# Patient Record
Sex: Female | Born: 1960
Health system: Southern US, Community
[De-identification: ages and names within clinical notes are randomized; demographics above are authoritative.]

## PROBLEM LIST (undated history)

## (undated) DIAGNOSIS — I10 Essential (primary) hypertension: Secondary | ICD-10-CM

## (undated) DIAGNOSIS — Z87442 Personal history of urinary calculi: Secondary | ICD-10-CM

## (undated) DIAGNOSIS — G35D Multiple sclerosis, unspecified: Secondary | ICD-10-CM

## (undated) DIAGNOSIS — Z720 Tobacco use: Secondary | ICD-10-CM

## (undated) DIAGNOSIS — E7800 Pure hypercholesterolemia, unspecified: Secondary | ICD-10-CM

## (undated) DIAGNOSIS — M62838 Other muscle spasm: Secondary | ICD-10-CM

## (undated) DIAGNOSIS — G8929 Other chronic pain: Secondary | ICD-10-CM

## (undated) DIAGNOSIS — Z9889 Other specified postprocedural states: Secondary | ICD-10-CM

## (undated) DIAGNOSIS — M25512 Pain in left shoulder: Secondary | ICD-10-CM

## (undated) DIAGNOSIS — G609 Hereditary and idiopathic neuropathy, unspecified: Secondary | ICD-10-CM

## (undated) DIAGNOSIS — M549 Dorsalgia, unspecified: Secondary | ICD-10-CM

## (undated) DIAGNOSIS — N319 Neuromuscular dysfunction of bladder, unspecified: Secondary | ICD-10-CM

## (undated) DIAGNOSIS — T8859XA Other complications of anesthesia, initial encounter: Secondary | ICD-10-CM

## (undated) DIAGNOSIS — D573 Sickle-cell trait: Secondary | ICD-10-CM

## (undated) DIAGNOSIS — G35 Multiple sclerosis: Secondary | ICD-10-CM

## (undated) DIAGNOSIS — R339 Retention of urine, unspecified: Secondary | ICD-10-CM

## (undated) DIAGNOSIS — Z90711 Acquired absence of uterus with remaining cervical stump: Secondary | ICD-10-CM

## (undated) DIAGNOSIS — N200 Calculus of kidney: Secondary | ICD-10-CM

## (undated) DIAGNOSIS — R8290 Unspecified abnormal findings in urine: Secondary | ICD-10-CM

## (undated) DIAGNOSIS — R112 Nausea with vomiting, unspecified: Secondary | ICD-10-CM

## (undated) DIAGNOSIS — N643 Galactorrhea not associated with childbirth: Secondary | ICD-10-CM

## (undated) DIAGNOSIS — R7303 Prediabetes: Secondary | ICD-10-CM

## (undated) DIAGNOSIS — IMO0002 Reserved for concepts with insufficient information to code with codable children: Secondary | ICD-10-CM

## (undated) DIAGNOSIS — R1011 Right upper quadrant pain: Secondary | ICD-10-CM

## (undated) DIAGNOSIS — R3129 Other microscopic hematuria: Secondary | ICD-10-CM

## (undated) DIAGNOSIS — M8580 Other specified disorders of bone density and structure, unspecified site: Secondary | ICD-10-CM

## (undated) DIAGNOSIS — S62109A Fracture of unspecified carpal bone, unspecified wrist, initial encounter for closed fracture: Secondary | ICD-10-CM

## (undated) DIAGNOSIS — E039 Hypothyroidism, unspecified: Secondary | ICD-10-CM

## (undated) HISTORY — DX: Prediabetes: R73.03

## (undated) HISTORY — DX: Other muscle spasm: M62.838

## (undated) HISTORY — DX: Tobacco use: Z72.0

## (undated) HISTORY — DX: Pure hypercholesterolemia, unspecified: E78.00

## (undated) HISTORY — DX: Hereditary and idiopathic neuropathy, unspecified: G60.9

## (undated) HISTORY — DX: Retention of urine, unspecified: R33.9

## (undated) HISTORY — DX: Neuromuscular dysfunction of bladder, unspecified: N31.9

## (undated) HISTORY — PX: TUBAL LIGATION: SHX77

## (undated) HISTORY — DX: Sickle-cell trait: D57.3

## (undated) HISTORY — DX: Other microscopic hematuria: R31.29

## (undated) HISTORY — DX: Multiple sclerosis: G35

## (undated) HISTORY — DX: Dorsalgia, unspecified: M54.9

## (undated) HISTORY — DX: Pain in left shoulder: M25.512

## (undated) HISTORY — DX: Right upper quadrant pain: R10.11

## (undated) HISTORY — DX: Calculus of kidney: N20.0

## (undated) HISTORY — DX: Other chronic pain: G89.29

## (undated) HISTORY — PX: KNEE SURGERY: SHX244

## (undated) HISTORY — DX: Unspecified abnormal findings in urine: R82.90

## (undated) HISTORY — DX: Hypothyroidism, unspecified: E03.9

## (undated) HISTORY — DX: Other specified disorders of bone density and structure, unspecified site: M85.80

## (undated) HISTORY — PX: FRACTURE SURGERY: SHX138

## (undated) HISTORY — DX: Multiple sclerosis, unspecified: G35.D

## (undated) HISTORY — DX: Galactorrhea not associated with childbirth: N64.3

## (undated) HISTORY — DX: Reserved for concepts with insufficient information to code with codable children: IMO0002

## (undated) HISTORY — DX: Acquired absence of uterus with remaining cervical stump: Z90.711

---

## 1994-05-08 HISTORY — PX: OTHER SURGICAL HISTORY: SHX169

## 2000-05-08 HISTORY — PX: BREAST CYST EXCISION: SHX579

## 2005-08-25 ENCOUNTER — Inpatient Hospital Stay: Payer: Self-pay | Admitting: Internal Medicine

## 2005-09-28 ENCOUNTER — Ambulatory Visit: Payer: Self-pay | Admitting: Internal Medicine

## 2005-10-09 ENCOUNTER — Ambulatory Visit: Payer: Self-pay | Admitting: Internal Medicine

## 2005-10-24 ENCOUNTER — Emergency Department: Payer: Self-pay | Admitting: Emergency Medicine

## 2006-02-26 ENCOUNTER — Ambulatory Visit: Payer: Self-pay | Admitting: Obstetrics and Gynecology

## 2006-03-08 HISTORY — PX: VAGINAL HYSTERECTOMY: SUR661

## 2006-10-25 ENCOUNTER — Ambulatory Visit: Payer: Self-pay | Admitting: Internal Medicine

## 2007-02-15 ENCOUNTER — Ambulatory Visit: Payer: Self-pay | Admitting: Urology

## 2007-10-28 ENCOUNTER — Ambulatory Visit: Payer: Self-pay | Admitting: Obstetrics and Gynecology

## 2009-03-11 ENCOUNTER — Ambulatory Visit: Payer: Self-pay | Admitting: Urology

## 2009-03-24 ENCOUNTER — Ambulatory Visit: Payer: Self-pay | Admitting: Internal Medicine

## 2009-03-30 ENCOUNTER — Ambulatory Visit: Payer: Self-pay | Admitting: Internal Medicine

## 2009-08-15 ENCOUNTER — Inpatient Hospital Stay: Payer: Self-pay | Admitting: Internal Medicine

## 2009-08-18 ENCOUNTER — Ambulatory Visit: Payer: Self-pay | Admitting: Cardiology

## 2009-09-06 ENCOUNTER — Encounter: Payer: Self-pay | Admitting: Neurology

## 2010-03-29 ENCOUNTER — Ambulatory Visit: Payer: Self-pay | Admitting: Internal Medicine

## 2010-07-25 ENCOUNTER — Ambulatory Visit: Payer: Self-pay | Admitting: Urology

## 2010-11-25 ENCOUNTER — Emergency Department: Payer: Self-pay | Admitting: Emergency Medicine

## 2011-05-12 ENCOUNTER — Ambulatory Visit: Payer: Self-pay | Admitting: Obstetrics and Gynecology

## 2011-07-31 ENCOUNTER — Inpatient Hospital Stay: Payer: Self-pay | Admitting: Internal Medicine

## 2011-07-31 LAB — COMPREHENSIVE METABOLIC PANEL
Alkaline Phosphatase: 103 U/L (ref 50–136)
BUN: 21 mg/dL — ABNORMAL HIGH (ref 7–18)
Bilirubin,Total: 0.6 mg/dL (ref 0.2–1.0)
Calcium, Total: 8.6 mg/dL (ref 8.5–10.1)
Co2: 24 mmol/L (ref 21–32)
Creatinine: 0.59 mg/dL — ABNORMAL LOW (ref 0.60–1.30)
Potassium: 3.9 mmol/L (ref 3.5–5.1)
SGOT(AST): 21 U/L (ref 15–37)
Sodium: 143 mmol/L (ref 136–145)
Total Protein: 8 g/dL (ref 6.4–8.2)

## 2011-07-31 LAB — CBC
HCT: 41.3 % (ref 35.0–47.0)
HGB: 13.8 g/dL (ref 12.0–16.0)
MCH: 27.8 pg (ref 26.0–34.0)
MCHC: 33.5 g/dL (ref 32.0–36.0)
MCV: 83 fL (ref 80–100)
RDW: 13.7 % (ref 11.5–14.5)
WBC: 11.3 10*3/uL — ABNORMAL HIGH (ref 3.6–11.0)

## 2011-07-31 LAB — URINALYSIS, COMPLETE
Bilirubin,UR: NEGATIVE
Glucose,UR: NEGATIVE mg/dL (ref 0–75)
Leukocyte Esterase: NEGATIVE
Ph: 5 (ref 4.5–8.0)
Protein: NEGATIVE
RBC,UR: NONE SEEN /HPF (ref 0–5)

## 2011-08-01 LAB — FOLATE: Folic Acid: 13 ng/mL (ref 3.1–100.0)

## 2011-08-01 LAB — HEMOGLOBIN A1C: Hemoglobin A1C: 5.1 % (ref 4.2–6.3)

## 2011-08-01 LAB — TSH: Thyroid Stimulating Horm: 0.204 u[IU]/mL — ABNORMAL LOW

## 2011-08-01 LAB — T4, FREE: Free Thyroxine: 1.22 ng/dL (ref 0.76–1.46)

## 2011-08-06 LAB — CULTURE, BLOOD (SINGLE)

## 2012-04-18 ENCOUNTER — Ambulatory Visit: Payer: Self-pay | Admitting: Unknown Physician Specialty

## 2012-04-22 LAB — PATHOLOGY REPORT

## 2012-05-13 ENCOUNTER — Ambulatory Visit: Payer: Self-pay | Admitting: Internal Medicine

## 2012-07-25 DIAGNOSIS — R3129 Other microscopic hematuria: Secondary | ICD-10-CM | POA: Insufficient documentation

## 2012-07-25 DIAGNOSIS — R1011 Right upper quadrant pain: Secondary | ICD-10-CM | POA: Insufficient documentation

## 2012-07-25 DIAGNOSIS — R339 Retention of urine, unspecified: Secondary | ICD-10-CM | POA: Insufficient documentation

## 2012-07-25 DIAGNOSIS — N3020 Other chronic cystitis without hematuria: Secondary | ICD-10-CM | POA: Insufficient documentation

## 2012-07-25 DIAGNOSIS — N319 Neuromuscular dysfunction of bladder, unspecified: Secondary | ICD-10-CM | POA: Insufficient documentation

## 2013-02-15 ENCOUNTER — Emergency Department: Payer: Self-pay | Admitting: Emergency Medicine

## 2013-02-15 LAB — CBC
HCT: 37.5 % (ref 35.0–47.0)
HGB: 12.9 g/dL (ref 12.0–16.0)
MCHC: 34.5 g/dL (ref 32.0–36.0)
MCV: 79 fL — ABNORMAL LOW (ref 80–100)
RBC: 4.72 10*6/uL (ref 3.80–5.20)
RDW: 13.6 % (ref 11.5–14.5)

## 2013-02-15 LAB — COMPREHENSIVE METABOLIC PANEL
Albumin: 3.6 g/dL (ref 3.4–5.0)
Alkaline Phosphatase: 127 U/L (ref 50–136)
Anion Gap: 5 — ABNORMAL LOW (ref 7–16)
BUN: 20 mg/dL — ABNORMAL HIGH (ref 7–18)
Bilirubin,Total: 0.3 mg/dL (ref 0.2–1.0)
Chloride: 109 mmol/L — ABNORMAL HIGH (ref 98–107)
Creatinine: 0.59 mg/dL — ABNORMAL LOW (ref 0.60–1.30)
Glucose: 94 mg/dL (ref 65–99)
SGOT(AST): 20 U/L (ref 15–37)
SGPT (ALT): 15 U/L (ref 12–78)
Total Protein: 7.2 g/dL (ref 6.4–8.2)

## 2013-02-15 LAB — TROPONIN I: Troponin-I: <0.02 ng/mL

## 2013-02-15 LAB — CK TOTAL AND CKMB (NOT AT ARMC): CK, Total: 49 U/L (ref 21–215)

## 2013-02-16 LAB — TROPONIN I: Troponin-I: <0.02 ng/mL

## 2013-02-21 ENCOUNTER — Ambulatory Visit: Payer: Self-pay | Admitting: Internal Medicine

## 2013-05-14 ENCOUNTER — Ambulatory Visit: Payer: Self-pay | Admitting: Internal Medicine

## 2013-08-05 HISTORY — PX: LAPAROSCOPIC SUPRACERVICAL HYSTERECTOMY: SHX5399

## 2013-08-05 LAB — HM PAP SMEAR: HM PAP: NEGATIVE

## 2013-08-19 DIAGNOSIS — E7800 Pure hypercholesterolemia, unspecified: Secondary | ICD-10-CM

## 2013-08-19 DIAGNOSIS — G609 Hereditary and idiopathic neuropathy, unspecified: Secondary | ICD-10-CM

## 2013-08-19 HISTORY — DX: Hereditary and idiopathic neuropathy, unspecified: G60.9

## 2013-08-19 HISTORY — DX: Pure hypercholesterolemia, unspecified: E78.00

## 2013-12-09 DIAGNOSIS — N200 Calculus of kidney: Secondary | ICD-10-CM

## 2013-12-09 DIAGNOSIS — N23 Unspecified renal colic: Secondary | ICD-10-CM | POA: Insufficient documentation

## 2013-12-09 HISTORY — DX: Calculus of kidney: N20.0

## 2014-05-21 DIAGNOSIS — M62838 Other muscle spasm: Secondary | ICD-10-CM | POA: Insufficient documentation

## 2014-05-21 DIAGNOSIS — R252 Cramp and spasm: Secondary | ICD-10-CM | POA: Insufficient documentation

## 2014-05-21 DIAGNOSIS — G479 Sleep disorder, unspecified: Secondary | ICD-10-CM | POA: Insufficient documentation

## 2014-05-21 DIAGNOSIS — M549 Dorsalgia, unspecified: Secondary | ICD-10-CM | POA: Insufficient documentation

## 2014-05-21 DIAGNOSIS — E669 Obesity, unspecified: Secondary | ICD-10-CM | POA: Insufficient documentation

## 2014-05-21 HISTORY — DX: Other muscle spasm: M62.838

## 2014-06-24 ENCOUNTER — Ambulatory Visit (INDEPENDENT_AMBULATORY_CARE_PROVIDER_SITE_OTHER): Payer: Medicare Other | Admitting: Podiatry

## 2014-06-24 ENCOUNTER — Encounter: Payer: Self-pay | Admitting: Podiatry

## 2014-06-24 DIAGNOSIS — Q828 Other specified congenital malformations of skin: Secondary | ICD-10-CM

## 2014-06-24 DIAGNOSIS — M79606 Pain in leg, unspecified: Secondary | ICD-10-CM

## 2014-06-24 DIAGNOSIS — B351 Tinea unguium: Secondary | ICD-10-CM

## 2014-06-24 NOTE — Progress Notes (Signed)
   Subjective:    Patient ID: Lisa Williams, female    DOB: Oct 02, 1960, 54 y.o.   MRN: [id number]  HPI  PT STATED RT FOOT HAVE CALLUS AND BEEN HURTING FOR OVER 1 YEAR. THE FOOT IS GETTING WORSE AND THE SKIN GETTING THICKER. TRIED CALLUS PADDING AND IT HELP SOME.  Review of Systems  Musculoskeletal: Positive for myalgias and gait problem.  All other systems reviewed and are negative.      Objective:   Physical Exam: I have reviewed her past medical history medications allergies surgery social history and review of systems. Pulses are strongly palpable bilateral. Neurologic sensorium is hyper reflexive secondary to MS. Muscle strength there is from one grew to the other and from limb to limp. Orthopedic evaluation demonstrates all joints distal to the ankle before range of motion without crepitation. Cutaneous evaluation demonstrates supple well-hydrated cutis nails are thick yellow dystrophic onychomycotic and painful palpation. Reactive hyperkeratosis of the first metatarsophalangeal joint of the right foot secondary to foot drop right.        Assessment & Plan:  Assessment: Multiple sclerosis resulting in foot drop and reactive hyperkeratosis plantar aspect of the right forefoot. This is a pre-ulcerative lesion. Pain in limb secondary to onychomycosis bilateral.  Plan: Debridement of nails 1 through 5 bilateral cover service secondary to pain and debridement of reactive hyperkeratosis today without incident or iatrogenic lesion. Discussed correction of the AFO device for which she wears to assist in offloading the forefoot.

## 2014-07-06 ENCOUNTER — Ambulatory Visit: Payer: Self-pay | Admitting: Internal Medicine

## 2014-07-06 LAB — HM MAMMOGRAPHY: HM MAMMO: NEGATIVE

## 2014-08-30 NOTE — H&P (Signed)
PATIENT NAME:  Lisa Williams, Lisa Williams MR#:  [id number] DATE OF BIRTH:  03/09/1961  DATE OF ADMISSION:  07/31/2011  PRIMARY CARE PHYSICIAN:  Dr. Kemper Durie ER PHYSICIAN:  Dr. Margarita Grizzle  CHIEF COMPLAINT: Sudden onset of right leg weakness.    HISTORY OF PRESENT ILLNESS: This is 54 year old female with a history of multiple sclerosis for the past  18 years who came in because the patient suddenly felt very weak in her legs. The patient was doing fine and went to [facility] and when coming back and getting out of the car she suddenly felt very weak in the legs, unable to get out and unable to move extremities, so EMS came and brought her here. Dr. Kemper Durie was notified by the ER doctor who suggested admitting her for MS flare.  The patient feels numb in both legs. She has a right leg brace for right foot drop feels very weak in the legs. She denies any chest pain. No cough. No fever. She said she worked hard over the weekend. No other specific symptoms. No blurred vision. She saw Dr. Kemper Durie on Friday and said everything was fine and no other complaints.   PAST MEDICAL HISTORY: Multiple sclerosis.  MEDICATIONS: Baclofen 40 mg 4 times daily.   PAST SURGICAL HISTORY:  1. Hysterectomy 2009.  2. Knee surgery twice.  ALLERGIES: Betadine.    SOCIAL HISTORY: Smokes about four cigars a day. No alcohol. No drugs. On disability.   FAMILY HISTORY: Significant for hypertension in father.  Father has congestive heart failure and diabetes. Mother also has hypertension. Sister has hemoglobin S.    REVIEW OF SYSTEMS:  CONSTITUTIONAL: Feels very weak. The patient has no fever.  EYES: No blurred vision. No loss of vision. ENT: No tinnitus. No epistaxis. No difficulty swallowing. RESPIRATORY: Denies any cough.  CARDIOVASCULAR: No chest pain. No palpitations. No trouble breathing. No pedal edema. GI: No nausea, no vomiting. No abdominal pain. GU: No dysuria. ENDOCRINE: No polyuria or nocturia. INTEGUMENT: No skin  rashes.  MUSCULOSKELETAL: No joint pains. NEUROLOGIC: Weakness and numbness in both legs. No dysarthria. PSYCH: No anxiety or insomnia.   PHYSICAL EXAMINATION:  VITAL SIGNS: Temperature 100.3, pulse 93, respirations 17, blood pressure is 113/59, sats 99% on room air.  GENERAL:  The patient is alert, awake, and oriented.    HEENT: Head atraumatic, normocephalic. Conjunctivae and lids are normal.  Pupils are equally reacting to light. Extraocular movements are intact. No nystagmus. ENT: Tympanic membranes not congested.  Nasal mucosa has no erythema.  Throat no oropharyngeal erythema.    NECK: No JVD. No carotid bruit. No lymphadenopathy.   LUNGS: Bilateral breath sounds present. No wheeze. No rales. Slight tachycardia.  Clear to auscultation.   CARDIOVASCULAR: S1 and S2 regular. Slight tachycardia present.   ABDOMEN: Soft, nontender, nondistended. Bowel sounds present.   EXTREMITIES: No extremity edema. No cyanosis. No clubbing.   NEUROLOGIC: The patient is weak bilaterally in both legs, unable to lift legs against gravity. Power is 1/5 in both legs.  Power is good in both the upper extremities. Sensations are also diminished in both legs, more so on the left side. The right leg has brace for the right footdrop, which is her baseline.  Cranial nerves are normal II-XII.  Deep tendon reflexes are 1+ in both extremities.   PSYCH: The patient was oriented to time, place, and person.   LABORATORY DATA: WBC 11.3, hemoglobin is 13.8, hematocrit 41.3, platelets 183.  Electrolytes: Sodium 143, potassium 3.9, chloride 108,  bicarbonate 24, BUN 21, creatinine 0.9, and glucose 81. Chest x-ray shows no focal infiltrate.    ASSESSMENT AND PLAN:  1. This is a 54 year old female with multiple sclerosis flare with sudden weakness and numbness in the legs. Dr. Kemper Durie was notified by the ER doctor and suggested admitting the patient for IV Solu-Medrol 1 gram IV for five days. We will admit her and continue  Solu-Medrol, check neuro checks, and ask Dr. Kemper Durie to see the patient. 2. GI prophylaxis and deep vein thrombosis prophylaxis. Also continue baclofen. 3. Leukocytosis with fever. The patient has no focal symptoms. We will monitor CBC and fever curve.  We will not start any antibiotics at this time. The patient has no symptoms of cough or sore throat or any abdominal pain. Chest x-ray is also clear. We will just monitor the CBC.  4. The patient's condition is stable.   TIME SPENT ON HISTORY AND PHYSICAL: About 60 minutes.   ____________________________ Katha Hamming, MD sk:bjt D: 07/31/2011 18:59:33 ET T: 08/01/2011 08:19:15 ET JOB#: [id number]  cc: Katha Hamming, MD, <Dictator> Rose Phi. Kemper Durie, MD Katha Hamming MD ELECTRONICALLY SIGNED 08/03/2011 8:18

## 2014-08-30 NOTE — Consult Note (Signed)
PATIENT NAME:  Lisa Williams, Lisa Williams MR#:  [id number] DATE OF BIRTH:  February 06, 1961  DATE OF CONSULTATION:  08/01/2011  REFERRING PHYSICIAN:  Dr. Vianne Bulls  CONSULTING PHYSICIAN:  Mila Homer. Tamala Julian, MD  REASON FOR CONSULTATION: Multiple sclerosis flare.   HISTORY OF PRESENT ILLNESS: This is a 54 year old right hand dominant African American female with known history of multiple sclerosis who comes in with acute onset of right-sided weakness and left-sided weakness mainly in the legs that occurred one day prior. She has been known to have multiple sclerosis since 1995 where she was worked up at that time with MRI, lumbar puncture, and evoked potentials which all showed this. She tried immunosuppression for a few years and then stopped because she didn't like the shots and since then has been off anything. Her last multiple sclerosis flare was in 2011 when she noted some weakness. Before that it was 2005. This resolved with no suppression whatsoever. Otherwise, she has been doing well and ambulating and doing most of her own activities of daily living.   PAST MEDICAL HISTORY: Past medical history is only significant for multiple sclerosis.  PAST SURGICAL HISTORY:  1. Hysterectomy. 2. Knee surgery. 3. Breast cyst removal.  MEDICATIONS AT HOME: Baclofen.   ALLERGIES: Betadine only.  FAMILY HISTORY: No autoimmune disorders or stroke.   SOCIAL HISTORY: She has positive tobacco. No alcohol. No illicits. She lives by herself.  REVIEW OF SYSTEMS: Negative x14 systems except as noted per history of present illness.      PHYSICAL EXAMINATION:  VITAL SIGNS: Temperature 98.0, blood pressure 114/68, pulse 74, respirations 18, sating 97% on room air.  GENERAL: She is slightly overweight in no acute distress.  HEENT: She is normocephalic. Sclerae nonicteric. Oropharynx clear.  NECK: Supple. No JVD.   CHEST: Clear to auscultation bilaterally without wheezes.  CARDIOVASCULAR: Regular rate and  rhythm. No murmurs.  ABDOMEN: Soft, nontender, and nondistended.   EXTREMITIES: No cyanosis, clubbing, or edema.  SKIN: No obvious lesions.   NEUROLOGIC: She is alert and oriented x3. Normal language. Normal speech.   CRANIAL NERVES: Pupils are equal and reactive bilateral. Extraocular movements intact. She has normal visual fields. Her face is symmetric. Tongue is midline. Shoulder shrug is equal.   MOTOR: She is 5/5 bilateral upper extremities. She is 2/5 in lower extremities to hip flexion and knee extension; 4/5 distally. There is also poor effort in trying to lift her legs up. She has slightly increased tone right greater than the left. She has a tremor noted in the left hand as well.   CEREBELLAR: Finger-to-nose and heel-to-shin within normal limits.  REFLEXES: 2+ bilateral uppers, 3+ right patellar reflex, 2+ left patellar, Achilles 1+ symmetrically. She has a right Babinski. Left plantar downgoing.   SENSORY: She has decreased vibratory sensory in lower extremities. She has no sensory below the hip. She also has loss of proprioception below the hip. Pinprick and temperature are normal.   There is no imaging to review.   CBC and BMP were reviewed and within normal limits.   IMPRESSION:  1. Multiple sclerosis flare. This does not follow all the exact patterns of a multiple sclerosis flare even though this is the highest probability that it could be. It is very unusual for the patient to lose complete vibratory sense and proprioception below their waist with this multiple sclerosis and this makes concern for other things such as B12 deficiency or even other type of neuropathy causing this. Syphilis of the spinal cord can do  this as well, yet we highly doubt this with tabes dorsalis. She has never had any imaging of her spine to look for any kind of spinal cord lesion.  2. Neuropathy. Etiology of this is unknown. This does not seem to be multiple sclerosis in origin so will evaluate  further for this.   PLAN: 1. MRI of the brain with and without contrast. 2. MRI of the C-spine with and without contrast. 3. Will check an SPEP, hemoglobin A1c, thyroid, RPR, B12, folate, ESR, CRP. 4. The patient should continue Solu-Medrol 1 gram IV daily x5 days.  We will continue to follow this patient with you. Thank you for the consultation.   ____________________________ Mila Homer Tamala Julian, MD mcs:drc D: 08/01/2011 12:11:37 ET T: 08/01/2011 13:01:13 ET JOB#: [id number]  cc: Mila Homer Tamala Julian, MD, <Dictator> Jamison Neighbor MD ELECTRONICALLY SIGNED 08/02/2011 10:59

## 2014-08-30 NOTE — Discharge Summary (Signed)
PATIENT NAME:  Lisa Williams, Lisa Williams MR#:  [id number] DATE OF BIRTH:  [birth year]  DATE OF ADMISSION:  07/31/2011 DATE OF DISCHARGE:  08/04/2011  DIAGNOSES: 1. Possible multiple sclerosis flare. 2. History of smoking.   DISPOSITION: The patient is being discharged home.   FOLLOW-UP: Follow-up with Dr. Loletta Specter in 1 to 2 weeks after discharge.   DISCHARGE MEDICATIONS:  1. Multivitamin 1 tablet daily.  2. Baclofen 40 mg q.i.d.  3. Dr. Loletta Specter has prescribed a prednisone taper for the patient which she will pick up from her pharmacy.   CONSULTATION: Neurology consultation with Dr. Tamala Julian    LABORATORY, DIAGNOSTIC, AND RADIOLOGICAL DATA: The patient was unable to have an MRI of the brain because of claustrophobia. Chest x-ray no acute disease of the chest. HIV negative. Complement C3, C4 level normal. ANA negative. ACE normal. HTLV I and II are pending. TSH was low but T3, T4 are normal. B12 is normal. RPR is negative. CRP was high. Vitamin D levels were normal. CBC normal. ESR normal. Hemoglobin A1c normal. B12 normal.   HOSPITAL COURSE: The patient is a 54 year old female with past medical history of multiple sclerosis who presented with sudden onset of bilateral lower extremity numbness. She was admitted with a diagnosis of possible multiple sclerosis flare and treated with high dose steroids for five days. Neurology consultation with Dr. Tamala Julian was obtained who felt that the patient's symptoms were not typical of multiple sclerosis because she had loss of vibration and proprioception in her lower extremity. He was concerned about possible transverse myelitis/involvement of spinal cord by the multiple sclerosis. MRI of the spine and brain were ordered, however, the patient was claustrophobic and was unable to undergo the procedure twice in spite of being given sedation. Her other extensive work-up including B12, folate, hemoglobin A1c, RPR, ESR, HIV, ANA, ACE inhibitors, C3, C4 levels were normal. HTLV  I and II are pending. She had low TSH but her free T3 and T4 are normal. She was evaluated by physical therapy during the hospitalization and no skill needs were identified. She requested a reacher and a front-wheeled walker, a prescription for which was given to the patient. By the time of discharge the patient felt significantly improved. Dr. Loletta Specter has called a prednisone taper prescription to her pharmacy which she will pick up upon discharge from the hospital. She will also follow-up with Dr. Loletta Specter as an outpatient.   TIME SPENT: 45 minutes.   ____________________________ Cherre Huger, MD sp:drc D: 08/04/2011 15:39:00 ET T: 08/07/2011 08:51:18 ET JOB#: [id number]  cc: Cherre Huger, MD, <Dictator> Cherre Huger MD ELECTRONICALLY SIGNED 08/12/2011 12:21

## 2014-09-28 ENCOUNTER — Ambulatory Visit: Payer: Medicare Other

## 2014-11-12 ENCOUNTER — Ambulatory Visit: Payer: Self-pay | Admitting: Obstetrics and Gynecology

## 2014-11-26 ENCOUNTER — Encounter: Payer: Self-pay | Admitting: Obstetrics and Gynecology

## 2014-11-26 ENCOUNTER — Ambulatory Visit (INDEPENDENT_AMBULATORY_CARE_PROVIDER_SITE_OTHER): Payer: Medicare Other | Admitting: Obstetrics and Gynecology

## 2014-11-26 VITALS — BP 163/84 | HR 76 | Ht 71.0 in | Wt 225.0 lb

## 2014-11-26 DIAGNOSIS — R638 Other symptoms and signs concerning food and fluid intake: Secondary | ICD-10-CM

## 2014-11-26 DIAGNOSIS — Z90711 Acquired absence of uterus with remaining cervical stump: Secondary | ICD-10-CM

## 2014-11-26 DIAGNOSIS — Z0000 Encounter for general adult medical examination without abnormal findings: Secondary | ICD-10-CM | POA: Diagnosis not present

## 2014-11-26 DIAGNOSIS — IMO0002 Reserved for concepts with insufficient information to code with codable children: Secondary | ICD-10-CM | POA: Insufficient documentation

## 2014-11-26 DIAGNOSIS — R888 Abnormal findings in other body fluids and substances: Secondary | ICD-10-CM | POA: Diagnosis not present

## 2014-11-26 DIAGNOSIS — Z01419 Encounter for gynecological examination (general) (routine) without abnormal findings: Secondary | ICD-10-CM

## 2014-11-26 DIAGNOSIS — Z720 Tobacco use: Secondary | ICD-10-CM

## 2014-11-26 DIAGNOSIS — G35 Multiple sclerosis: Secondary | ICD-10-CM

## 2014-11-26 DIAGNOSIS — N643 Galactorrhea not associated with childbirth: Secondary | ICD-10-CM | POA: Insufficient documentation

## 2014-11-26 DIAGNOSIS — Z90710 Acquired absence of both cervix and uterus: Secondary | ICD-10-CM | POA: Diagnosis not present

## 2014-11-26 DIAGNOSIS — B977 Papillomavirus as the cause of diseases classified elsewhere: Secondary | ICD-10-CM | POA: Diagnosis not present

## 2014-11-26 HISTORY — DX: Acquired absence of uterus with remaining cervical stump: Z90.711

## 2014-11-26 HISTORY — DX: Tobacco use: Z72.0

## 2014-11-26 HISTORY — DX: Galactorrhea not associated with childbirth: N64.3

## 2014-11-26 NOTE — Progress Notes (Signed)
Patient ID: Lisa Williams, female   DOB: 10/03/1960, 54 y.o.   MRN: [id number] ANNUAL PREVENTATIVE CARE GYN  ENCOUNTER NOTE  Subjective:       Lisa Williams is a 54 y.o. G0P0000 female here for a routine annual gynecologic exam.  Current complaints: 1.  ANNUAL 2.  History of positive HPV on Pap   Gynecologic History No LMP recorded. Patient has had a hysterectomy. Contraception: NA Last Pap: 08/05/2013 NEGATIVE/+HPV;  Colposcopy (05/14/2014) endocervix-fragments of negative endocervix,; Extemely scanty specimen. Last mammogram:  07/06/2014 [facility]: negative.  Obstetric History OB History  Gravida Para Term Preterm AB SAB TAB Ectopic Multiple Living  0 0 0 0 0 0 0 0 0 0         Past Medical History  Diagnosis Date  . MS (multiple sclerosis)   . HPV test positive     Past Surgical History  Procedure Laterality Date  . Laparoscopic supracervical hysterectomy  08/05/2013  . Bilateral tubal ligation  1996  . Knee surgery      multiple     Current Outpatient Prescriptions on File Prior to Visit  Medication Sig Dispense Refill  . baclofen (LIORESAL) 20 MG tablet Take by mouth.    . Multiple Vitamins-Minerals (MULTIVITAMIN WITH MINERALS) tablet Take by mouth.    . oxybutynin (DITROPAN-XL) 10 MG 24 hr tablet Take by mouth.    Marland Kitchen tiZANidine (ZANAFLEX) 4 MG tablet Take by mouth.     No current facility-administered medications on file prior to visit.    Allergies  Allergen Reactions  . Povidone Iodine Rash    History   Social History  . Marital Status: Single    Spouse Name: N/A  . Number of Children: N/A  . Years of Education: N/A   Occupational History  . Not on file.   Social History Main Topics  . Smoking status: Current Every Day Smoker  . Smokeless tobacco: Never Used  . Alcohol Use: No  . Drug Use: No  . Sexual Activity: No   Other Topics Concern  . Not on file   Social History Narrative    Family History  Problem Relation Age of  Onset  . Sickle cell trait Sister   . Supraventricular tachycardia Sister   . Diabetes Maternal Grandmother   . Diabetes Maternal Aunt   . Breast cancer Paternal Grandmother   . Liver cancer Maternal Grandmother   . Hypertension Father   . Hypertension Sister   . Ovarian cancer Paternal Grandmother     The following portions of the patient's history were reviewed and updated as appropriate: allergies, current medications, past family history, past medical history, past social history, past surgical history and problem list.  Review of Systems ROS Review of Systems - General ROS: negative for - chills, fatigue, fever, hot flashes, night sweats, weight gain or weight loss Psychological ROS: negative for - anxiety, decreased libido, depression, mood swings, physical abuse or sexual abuse Ophthalmic ROS: negative for - blurry vision, eye pain or loss of vision ENT ROS: negative for - headaches, hearing change, visual changes or vocal changes Allergy and Immunology ROS: negative for - hives, itchy/watery eyes or seasonal allergies Hematological and Lymphatic ROS: negative for - bleeding problems, bruising, swollen lymph nodes or weight loss Endocrine ROS: negative for - galactorrhea, hair pattern changes, hot flashes, malaise/lethargy, mood swings, palpitations, polydipsia/polyuria, skin changes, temperature intolerance or unexpected weight changes Breast ROS: negative for - new or changing breast lumps or nipple discharge Respiratory  ROS: negative for - cough or shortness of breath Cardiovascular ROS: negative for - chest pain, irregular heartbeat, palpitations or shortness of breath Gastrointestinal ROS: no abdominal pain, change in bowel habits, or black or bloody stools Genito-Urinary ROS: POSITIVE-trouble voiding, hematuria Musculoskeletal ROS: negative for - joint pain; POSITIVE- joint stiffness Neurological ROS: negative for - bowel and bladder control changes Dermatological ROS:  negative for rash and skin lesion changes   Objective:   BP 163/84 mmHg  Pulse 76  Ht  (1.803 m)  Wt 225 lb (102.059 kg)  BMI 31.39 kg/m2 CONSTITUTIONAL: Well-developed, well-nourished female in no acute distress.  PSYCHIATRIC: Normal mood and affect. Normal behavior. Normal judgment and thought content. NEUROLGIC: Alert and oriented to person, place, and time. Normal muscle tone coordination. No cranial nerve deficit noted. HENT:  Normocephalic, atraumatic, External right and left ear normal. Oropharynx is clear and moist EYES: Conjunctivae and EOM are normal. Pupils are equal, round, and reactive to light. No scleral icterus.  NECK: Normal range of motion, supple, no masses.  Normal thyroid.  SKIN: Skin is warm and dry. No rash noted. Not diaphoretic. No erythema. No pallor. CARDIOVASCULAR: Normal heart rate noted, regular rhythm, no murmur. RESPIRATORY: Clear to auscultation bilaterally. Effort and breath sounds normal, no problems with respiration noted. BREASTS: Symmetric in size. No masses, skin changes, nipple drainage, or lymphadenopathy. ABDOMEN: Soft, normal bowel sounds, no distention noted.  No tenderness, rebound or guarding.  BLADDER: Normal PELVIC:  External Genitalia: Normal  BUS: Normal  Vagina: Normal  Cervix: Normal  Uterus: surgically absent  Adnexa: Normal  RV: External Exam NormaI, No Rectal Masses and Normal Sphincter tone  MUSCULOSKELETAL: Normal range of motion. No tenderness.  No cyanosis, clubbing, or edema.  2+ distal pulses.SPASTICITY. LYMPHATIC: No Axillary, Supraclavicular, or Inguinal Adenopathy.    Assessment:   Annual gynecologic examination 54 y.o. Contraception: na (hysterectomy)-LSH BMI: 31 Multiple sclerosis, stable HPV positive on Pap smear    Plan:  Pap: DONE, PAP/HPV Mammogram: NOT ORDERED: DONE 07/06/2014 Stool Guaiac Testing:  Already done per pt. Labs: Lipid, HbgA1c, TSH Routine preventative health maintenance measures  emphasized:  Encouraged healthy eating, portion control and exercise as tolerated. Return to Clinic - 1 Year   Fenton Malling, LPN   Herold Harms, MD

## 2014-11-27 ENCOUNTER — Other Ambulatory Visit: Payer: Medicare Other

## 2014-11-28 LAB — TSH: TSH: 2.32 u[IU]/mL (ref 0.450–4.500)

## 2014-11-28 LAB — LIPID PANEL
CHOL/HDL RATIO: 3.5 ratio (ref 0.0–4.4)
CHOLESTEROL TOTAL: 178 mg/dL (ref 100–199)
HDL: 51 mg/dL (ref >39)
LDL Calculated: 112 mg/dL — ABNORMAL HIGH (ref 0–99)
Triglycerides: 74 mg/dL (ref 0–149)
VLDL Cholesterol Cal: 15 mg/dL (ref 5–40)

## 2014-11-28 LAB — HEMOGLOBIN A1C
ESTIMATED AVERAGE GLUCOSE: 103 mg/dL
HEMOGLOBIN A1C: 5.2 % (ref 4.8–5.6)

## 2014-11-28 LAB — GLUCOSE, FASTING: Glucose, Plasma: 81 mg/dL (ref 65–99)

## 2014-11-30 LAB — PAP IG AND HPV HIGH-RISK: PAP SMEAR COMMENT: 0

## 2015-05-18 ENCOUNTER — Ambulatory Visit: Payer: Medicare Other | Admitting: Obstetrics and Gynecology

## 2015-06-02 ENCOUNTER — Ambulatory Visit (INDEPENDENT_AMBULATORY_CARE_PROVIDER_SITE_OTHER): Payer: Medicare Other | Admitting: Obstetrics and Gynecology

## 2015-06-02 ENCOUNTER — Encounter: Payer: Self-pay | Admitting: Obstetrics and Gynecology

## 2015-06-02 VITALS — BP 172/81 | HR 75 | Wt 223.5 lb

## 2015-06-02 DIAGNOSIS — L739 Follicular disorder, unspecified: Secondary | ICD-10-CM | POA: Diagnosis not present

## 2015-06-02 DIAGNOSIS — B977 Papillomavirus as the cause of diseases classified elsewhere: Secondary | ICD-10-CM

## 2015-06-02 DIAGNOSIS — IMO0002 Reserved for concepts with insufficient information to code with codable children: Secondary | ICD-10-CM

## 2015-06-02 DIAGNOSIS — R888 Abnormal findings in other body fluids and substances: Secondary | ICD-10-CM

## 2015-06-02 NOTE — Patient Instructions (Signed)
1.  Pap smear/HPV performed today. 2.  No treatment is necessary for folliculitis.  At this time. 3.  Return in 6 months for annual exam

## 2015-06-02 NOTE — Addendum Note (Signed)
Addended by: Jackquline Denmark on: 06/02/2015 10:43 AM   Modules accepted: Orders

## 2015-06-02 NOTE — Progress Notes (Signed)
Chief complaint: 1.  Positive high-risk HPV on Pap. 2.  Inguinal boil.  Lisa Williams presents today for her 6 month Pap smear.  This Pap smear was negative for cellular abnormalities, but positive for high-risk HPV. Lisa Williams has noted some hair follicle inflammation with boil development and subsequent rupture.  She is not having any fevers.  Past medical history, past surgical history, medications, allergies, reviewed.  OBJECTIVE: BP 172/81 mmHg  Pulse 75  Wt 223 lb 8 oz (101.379 kg) Pleasant female with MS, needing assistance getting on and off the exam table as well as assistance with maintaining leg separation to perform Pap smear. Pelvic: Healing folliculitis in right groin. External genitalia-normal BUS-normal Vagina-normal Cervix-nulliparous; no significant discharge; no lesions. Bimanual-not done.  ASSESSMENT: 1.  Positive high risk HPV on Pap smear (cells, negative). 2.  Inguinal folliculitis, resolving, without need for antibiotics.  PLAN: 1.  Pap smear/HPV. 2.  No treatment necessary for resolving folliculitis. 3.  Return in 6 months for annual exam.  Herold Harms, MD  Note: This dictation was prepared with Dragon dictation along with smaller phrase technology. Any transcriptional errors that result from this process are unintentional.

## 2015-06-09 LAB — PAP IG AND HPV HIGH-RISK
HPV, HIGH-RISK: NEGATIVE
PAP Smear Comment: 0

## 2015-07-14 ENCOUNTER — Emergency Department: Payer: Medicare Other

## 2015-07-14 ENCOUNTER — Inpatient Hospital Stay
Admission: EM | Admit: 2015-07-14 | Discharge: 2015-07-17 | DRG: 060 | Disposition: A | Discharge status: Home / Self Care | Payer: Medicare Other | Attending: Internal Medicine | Admitting: Internal Medicine

## 2015-07-14 ENCOUNTER — Encounter: Payer: Self-pay | Admitting: Emergency Medicine

## 2015-07-14 DIAGNOSIS — A630 Anogenital (venereal) warts: Secondary | ICD-10-CM | POA: Diagnosis present

## 2015-07-14 DIAGNOSIS — Z888 Allergy status to other drugs, medicaments and biological substances status: Secondary | ICD-10-CM

## 2015-07-14 DIAGNOSIS — M549 Dorsalgia, unspecified: Secondary | ICD-10-CM | POA: Diagnosis present

## 2015-07-14 DIAGNOSIS — G35 Multiple sclerosis: Secondary | ICD-10-CM | POA: Diagnosis not present

## 2015-07-14 DIAGNOSIS — G8929 Other chronic pain: Secondary | ICD-10-CM | POA: Diagnosis present

## 2015-07-14 DIAGNOSIS — Z79899 Other long term (current) drug therapy: Secondary | ICD-10-CM

## 2015-07-14 DIAGNOSIS — M62838 Other muscle spasm: Secondary | ICD-10-CM | POA: Diagnosis present

## 2015-07-14 DIAGNOSIS — F17210 Nicotine dependence, cigarettes, uncomplicated: Secondary | ICD-10-CM | POA: Diagnosis present

## 2015-07-14 DIAGNOSIS — N319 Neuromuscular dysfunction of bladder, unspecified: Secondary | ICD-10-CM | POA: Diagnosis present

## 2015-07-14 DIAGNOSIS — R040 Epistaxis: Secondary | ICD-10-CM | POA: Diagnosis not present

## 2015-07-14 HISTORY — DX: Dorsalgia, unspecified: M54.9

## 2015-07-14 LAB — URINALYSIS COMPLETE WITH MICROSCOPIC (ARMC ONLY)
BACTERIA UA: NONE SEEN
Bilirubin Urine: NEGATIVE
GLUCOSE, UA: NEGATIVE mg/dL
LEUKOCYTES UA: NEGATIVE
Nitrite: NEGATIVE
PH: 6 (ref 5.0–8.0)
Protein, ur: NEGATIVE mg/dL
Specific Gravity, Urine: 1.013 (ref 1.005–1.030)

## 2015-07-14 LAB — CBC WITH DIFFERENTIAL/PLATELET
BASOS PCT: 1 %
Basophils Absolute: 0.1 10*3/uL (ref 0–0.1)
EOS ABS: 0 10*3/uL (ref 0–0.7)
Eosinophils Relative: 1 %
HCT: 37.6 % (ref 35.0–47.0)
HEMOGLOBIN: 13.1 g/dL (ref 12.0–16.0)
Lymphocytes Relative: 8 %
Lymphs Abs: 0.6 10*3/uL — ABNORMAL LOW (ref 1.0–3.6)
MCH: 27.1 pg (ref 26.0–34.0)
MCHC: 35 g/dL (ref 32.0–36.0)
MCV: 77.5 fL — ABNORMAL LOW (ref 80.0–100.0)
MONO ABS: 1 10*3/uL — AB (ref 0.2–0.9)
MONOS PCT: 12 %
NEUTROS PCT: 78 %
Neutro Abs: 6.4 10*3/uL (ref 1.4–6.5)
Platelets: 177 10*3/uL (ref 150–440)
RBC: 4.85 MIL/uL (ref 3.80–5.20)
RDW: 13.5 % (ref 11.5–14.5)
WBC: 8.1 10*3/uL (ref 3.6–11.0)

## 2015-07-14 LAB — COMPREHENSIVE METABOLIC PANEL
ALK PHOS: 122 U/L (ref 38–126)
ALT: 18 U/L (ref 14–54)
AST: 20 U/L (ref 15–41)
Albumin: 4 g/dL (ref 3.5–5.0)
Anion gap: 9 (ref 5–15)
BILIRUBIN TOTAL: 0.6 mg/dL (ref 0.3–1.2)
BUN: 16 mg/dL (ref 6–20)
CALCIUM: 9.1 mg/dL (ref 8.9–10.3)
CO2: 23 mmol/L (ref 22–32)
CREATININE: 0.55 mg/dL (ref 0.44–1.00)
Chloride: 107 mmol/L (ref 101–111)
GFR calc non Af Amer: >60 mL/min (ref >60)
Glucose, Bld: 97 mg/dL (ref 65–99)
Potassium: 3.7 mmol/L (ref 3.5–5.1)
SODIUM: 139 mmol/L (ref 135–145)
TOTAL PROTEIN: 7.7 g/dL (ref 6.5–8.1)

## 2015-07-14 LAB — RAPID INFLUENZA A&B ANTIGENS (ARMC ONLY)
INFLUENZA A (ARMC): NEGATIVE
INFLUENZA B (ARMC): NEGATIVE

## 2015-07-14 LAB — TROPONIN I

## 2015-07-14 LAB — PREGNANCY, URINE: PREG TEST UR: NEGATIVE

## 2015-07-14 LAB — LIPASE, BLOOD: Lipase: 14 U/L (ref 11–51)

## 2015-07-14 MED ORDER — ONDANSETRON HCL 4 MG/2ML IJ SOLN: 4.0000 mg | Freq: Four times a day (QID) | INTRAMUSCULAR | Status: DC | PRN

## 2015-07-14 MED ORDER — SODIUM CHLORIDE 0.9 % IV SOLN
1000.0000 mg | Freq: Every day | INTRAVENOUS | Status: AC
  Administered 2015-07-14 – 2015-07-16 (×3): 1000 mg via INTRAVENOUS
  Filled 2015-07-14 (×3): qty 8

## 2015-07-14 MED ORDER — MORPHINE SULFATE (PF) 4 MG/ML IV SOLN
4.0000 mg | Freq: Once | INTRAVENOUS | Status: AC
  Administered 2015-07-14: 4 mg via INTRAVENOUS

## 2015-07-14 MED ORDER — ONDANSETRON HCL 4 MG/2ML IJ SOLN
4.0000 mg | Freq: Once | INTRAMUSCULAR | Status: AC
  Administered 2015-07-14: 4 mg via INTRAVENOUS

## 2015-07-14 MED ORDER — ONDANSETRON HCL 4 MG/2ML IJ SOLN
4.0000 mg | Freq: Once | INTRAMUSCULAR | Status: DC
  Filled 2015-07-14: qty 2

## 2015-07-14 MED ORDER — BACLOFEN 10 MG PO TABS
20.0000 mg | ORAL_TABLET | Freq: Four times a day (QID) | ORAL | Status: DC
  Administered 2015-07-14 – 2015-07-17 (×11): 20 mg via ORAL
  Filled 2015-07-14 (×12): qty 2

## 2015-07-14 MED ORDER — HYDROCODONE-ACETAMINOPHEN 5-325 MG PO TABS: 1.0000 | ORAL_TABLET | ORAL | Status: DC | PRN

## 2015-07-14 MED ORDER — MORPHINE SULFATE (PF) 2 MG/ML IV SOLN: 2.0000 mg | INTRAVENOUS | Status: DC | PRN

## 2015-07-14 MED ORDER — SODIUM CHLORIDE 0.9 % IV SOLN: 1000.0000 mg | Freq: Every day | INTRAVENOUS | Status: DC

## 2015-07-14 MED ORDER — SODIUM CHLORIDE 0.9 % IV SOLN
INTRAVENOUS | Status: DC
  Administered 2015-07-14 – 2015-07-15 (×2): via INTRAVENOUS

## 2015-07-14 MED ORDER — ONDANSETRON HCL 4 MG PO TABS: 4.0000 mg | ORAL_TABLET | Freq: Four times a day (QID) | ORAL | Status: DC | PRN

## 2015-07-14 MED ORDER — TIZANIDINE HCL 4 MG PO TABS
4.0000 mg | ORAL_TABLET | Freq: Four times a day (QID) | ORAL | Status: DC | PRN
  Filled 2015-07-14: qty 1

## 2015-07-14 MED ORDER — OXYBUTYNIN CHLORIDE 5 MG PO TABS
5.0000 mg | ORAL_TABLET | Freq: Three times a day (TID) | ORAL | Status: DC
  Administered 2015-07-14 – 2015-07-17 (×9): 5 mg via ORAL
  Filled 2015-07-14 (×9): qty 1

## 2015-07-14 MED ORDER — ACETAMINOPHEN 650 MG RE SUPP: 650.0000 mg | Freq: Four times a day (QID) | RECTAL | Status: DC | PRN

## 2015-07-14 MED ORDER — ACETAMINOPHEN 325 MG PO TABS: 650.0000 mg | ORAL_TABLET | Freq: Four times a day (QID) | ORAL | Status: DC | PRN

## 2015-07-14 MED ORDER — ADULT MULTIVITAMIN W/MINERALS CH
1.0000 | ORAL_TABLET | Freq: Every day | ORAL | Status: DC
Start: 2015-07-14 — End: 2015-07-17
  Administered 2015-07-14 – 2015-07-17 (×4): 1 via ORAL
  Filled 2015-07-14 (×4): qty 1

## 2015-07-14 MED ORDER — ENOXAPARIN SODIUM 40 MG/0.4ML ~~LOC~~ SOLN
40.0000 mg | SUBCUTANEOUS | Status: DC
  Administered 2015-07-14 – 2015-07-16 (×3): 40 mg via SUBCUTANEOUS
  Filled 2015-07-14 (×3): qty 0.4

## 2015-07-14 MED ORDER — KETOROLAC TROMETHAMINE 30 MG/ML IJ SOLN
30.0000 mg | Freq: Once | INTRAMUSCULAR | Status: AC
  Administered 2015-07-14: 30 mg via INTRAVENOUS
  Filled 2015-07-14: qty 1

## 2015-07-14 MED ORDER — SODIUM CHLORIDE 0.9 % IV BOLUS (SEPSIS)
1000.0000 mL | Freq: Once | INTRAVENOUS | Status: AC
  Administered 2015-07-14: 1000 mL via INTRAVENOUS

## 2015-07-14 NOTE — H&P (Signed)
[facility] [place] at [facility]   PATIENT NAME: Lisa Williams    MR#:  [id number]  DATE OF BIRTH:  [birth year]  DATE OF ADMISSION:  07/14/2015  PRIMARY CARE PHYSICIAN: Lauro Regulus., MD   REQUESTING/REFERRING PHYSICIAN: Dr. Toney Rakes  CHIEF COMPLAINT:   Chief Complaint  Patient presents with  . Weakness  . Nausea  . Eye Pain    HISTORY OF PRESENT ILLNESS:  Keilana Williams  is a 55 y.o. female with a known history of multiple sclerosis for 21 years, following with outpatient neurologist and not in any chronic therapy due to side effects presents to the hospital secondary to worsening right leg weakness and generalized muscle weakness. Patient states her MS flares usually involve the right leg and it presents as weakness. Denies any sensory changes. Also associated with that she has a significant headache and also nausea today. Denies any blurred vision, no chest pain abdominal pain or any other symptoms. She ambulates with a walker at baseline and also has a wheelchair but is independent and drives when she doesn't have the flares. Her symptoms started this morning. Seen by neurologist in the emergency room who recommended admission for IV steroids.  PAST MEDICAL HISTORY:   Past Medical History  Diagnosis Date  . MS (multiple sclerosis) (HCC)   . HPV test positive   . Back pain     PAST SURGICAL HISTORY:   Past Surgical History  Procedure Laterality Date  . Laparoscopic supracervical hysterectomy  08/05/2013  . Bilateral tubal ligation  1996  . Knee surgery      right    SOCIAL HISTORY:   Social History  Substance Use Topics  . Smoking status: Current Every Day Smoker -- 0.50 packs/day    Types: Cigarettes  . Smokeless tobacco: Never Used  . Alcohol Use: No    FAMILY HISTORY:   Family History  Problem Relation Age of Onset  . Sickle cell trait Sister   . Supraventricular tachycardia Sister   . Diabetes Maternal  Grandmother   . Diabetes Maternal Aunt   . Breast cancer Paternal Grandmother   . Liver cancer Maternal Grandmother   . Hypertension Father   . Hypertension Sister   . Ovarian cancer Paternal Grandmother     DRUG ALLERGIES:   Allergies  Allergen Reactions  . Povidone Iodine Rash    REVIEW OF SYSTEMS:   Review of Systems  Constitutional: Positive for malaise/fatigue. Negative for fever, chills and weight loss.  HENT: Negative for ear discharge, ear pain, hearing loss, nosebleeds and tinnitus.   Eyes: Negative for blurred vision, double vision and photophobia.  Respiratory: Negative for cough, hemoptysis, shortness of breath and wheezing.   Cardiovascular: Negative for chest pain, palpitations, orthopnea and leg swelling.  Gastrointestinal: Positive for nausea. Negative for heartburn, vomiting, abdominal pain, diarrhea, constipation and melena.  Genitourinary: Positive for urgency. Negative for dysuria and frequency.  Musculoskeletal: Positive for myalgias and back pain. Negative for neck pain.  Skin: Negative for rash.  Neurological: Positive for focal weakness and headaches. Negative for dizziness, tingling, tremors, sensory change and speech change.  Endo/Heme/Allergies: Does not bruise/bleed easily.  Psychiatric/Behavioral: Negative for depression.    MEDICATIONS AT HOME:   Prior to Admission medications   Medication Sig Start Date End Date Taking? Authorizing Provider  baclofen (LIORESAL) 20 MG tablet Take 20 mg by mouth 4 (four) times daily.    Yes Historical Provider, MD  Multiple Vitamins-Minerals (MULTIVITAMIN WITH MINERALS) tablet Take 1  tablet by mouth daily.    Yes Historical Provider, MD  oxybutynin (DITROPAN) 5 MG tablet Take 5 mg by mouth 3 (three) times daily.   Yes Historical Provider, MD  tiZANidine (ZANAFLEX) 4 MG tablet Take 4 mg by mouth every 6 (six) hours as needed for muscle spasms.    Yes Historical Provider, MD      VITAL SIGNS:  Blood pressure  150/69, pulse 93, temperature 98.9 F (37.2 C), temperature source Oral, resp. rate 18, height 5\' 11"  (1.803 m), weight 96.616 kg (213 lb), SpO2 99 %.  PHYSICAL EXAMINATION:   Physical Exam  GENERAL:  55 y.o.-year-old obese patient lying in the bed with no acute distress.  EYES: Pupils equal, round, reactive to light and accommodation. No scleral icterus. Extraocular muscles intact.  HEENT: Head atraumatic, normocephalic. Oropharynx and nasopharynx clear.  NECK:  Supple, no jugular venous distention. No thyroid enlargement, no tenderness.  LUNGS: Normal breath sounds bilaterally, no wheezing, rales,rhonchi or crepitation. No use of accessory muscles of respiration.  CARDIOVASCULAR: S1, S2 normal. No murmurs, rubs, or gallops.  ABDOMEN: Soft, nontender, nondistended. Bowel sounds present. No organomegaly or mass.  EXTREMITIES: No pedal edema, cyanosis, or clubbing.  NEUROLOGIC: Cranial nerves II through XII are intact. Muscle strength 5/5 in upper extremities and 3/5 in both lower extremities. Sensation intact. Gait not checked.  PSYCHIATRIC: The patient is alert and oriented x 3.  SKIN: No obvious rash, lesion, or ulcer.   LABORATORY PANEL:   CBC  Recent Labs Lab 07/14/15 1046  WBC 8.1  HGB 13.1  HCT 37.6  PLT 177   ------------------------------------------------------------------------------------------------------------------  Chemistries   Recent Labs Lab 07/14/15 1046  NA 139  K 3.7  CL 107  CO2 23  GLUCOSE 97  BUN 16  CREATININE 0.55  CALCIUM 9.1  AST 20  ALT 18  ALKPHOS 122  BILITOT 0.6   ------------------------------------------------------------------------------------------------------------------  Cardiac Enzymes  Recent Labs Lab 07/14/15 1046  TROPONINI <0.03   ------------------------------------------------------------------------------------------------------------------  RADIOLOGY:  Dg Chest 2 View  07/14/2015  CLINICAL DATA:  Weakness.   Head pressure. EXAM: CHEST  2 VIEW COMPARISON:  02/21/2013 FINDINGS: The heart size and mediastinal contours are within normal limits. Both lungs are clear. The visualized skeletal structures are unremarkable. IMPRESSION: No active cardiopulmonary disease. Electronically Signed   By: Signa Kell M.D.   On: 07/14/2015 12:53   Ct Head Wo Contrast  07/14/2015  CLINICAL DATA:  Right-sided weakness. Headache, complaining of pain behind the eyes. Vomiting and nausea x 1. EXAM: CT HEAD WITHOUT CONTRAST TECHNIQUE: Contiguous axial images were obtained from the base of the skull through the vertex without intravenous contrast. COMPARISON:  None. FINDINGS: There is no evidence of mass effect, midline shift or extra-axial fluid collections. There is no evidence of a space-occupying lesion or intracranial hemorrhage. There is no evidence of a cortical-based area of acute infarction. Mild periventricular white matter low attenuation. The ventricles and sulci are appropriate for the patient's age. The basal cisterns are patent. Visualized portions of the orbits are unremarkable. The visualized portions of the paranasal sinuses and mastoid air cells are unremarkable. The osseous structures are unremarkable. IMPRESSION: No acute intracranial pathology. Electronically Signed   By: Elige Ko   On: 07/14/2015 10:39    EKG:   Orders placed or performed during the hospital encounter of 07/14/15  . ED EKG  . ED EKG  . EKG 12-Lead  . EKG 12-Lead    IMPRESSION AND PLAN:   Adela Lank Klimaszewski  is a 55 y.o. female with a known history of multiple sclerosis for 21 years, following with outpatient neurologist and not in any chronic therapy due to side effects presents to the hospital secondary to worsening right leg weakness and generalized muscle weakness.  #1 MS exacerbation- appreciate neurology consult - admit, neuro checks, IV solumedrol - PT consult - cont her baclofen for muscle spasms  #2 Neurogenic bladder-  follows with Urology as outpatient. Cont home meds  #3 Chronic back pain- pain meds as needed  #4 DVT Prophylaxis- lovenox  #5 Tobacco use disorder- counseled against smoking for almost 3 min. Feels ready to quit.     All the records are reviewed and case discussed with ED provider. Management plans discussed with the patient, family and they are in agreement.  CODE STATUS: Full Code  TOTAL TIME TAKING CARE OF THIS PATIENT: 50 minutes.    Enid Baas M.D on 07/14/2015 at 2:56 PM  Between 7am to 6pm - Pager - [phone]  After 6pm go to [email or web address] - password [email or web address] [email or web address]  [place] [place]  Office  [phone]  CC: Primary care physician; Lauro Regulus., MD

## 2015-07-14 NOTE — ED Notes (Signed)
Patient at Xray.

## 2015-07-14 NOTE — Consult Note (Signed)
Reason for Consult:Lower extremity numbness and weakness Referring Physician: Inocencio Homes  CC: Lower extremity numbness and weakness  HPI: Lisa Williams is an 55 y.o. female with a history of MS who reports that last night she began to feel poorly.  She had a low grade temperature and body aches.  Last evening she also noted that she was having decreased ability to be able to hold her urine.  Today she developed headache, nausea and vomiting.  Has noted weakness in her lower extremities and is no longer able to ambulate.  Also reports numbness in her RLE and pelvic area.  Has had no true urinary incontinence or bowel incontinence.  Last bowel movement was yesterday.  Reports no upper extremity or visual complaints.     At baseline ambulates with a walker.  Past Medical History  Diagnosis Date  . MS (multiple sclerosis) (HCC)   . HPV test positive     Past Surgical History  Procedure Laterality Date  . Laparoscopic supracervical hysterectomy  08/05/2013  . Bilateral tubal ligation  1996  . Knee surgery      multiple     Family History  Problem Relation Age of Onset  . Sickle cell trait Sister   . Supraventricular tachycardia Sister   . Diabetes Maternal Grandmother   . Diabetes Maternal Aunt   . Breast cancer Paternal Grandmother   . Liver cancer Maternal Grandmother   . Hypertension Father   . Hypertension Sister   . Ovarian cancer Paternal Grandmother     Social History:  reports that she has been smoking Cigarettes.  She has been smoking about 0.50 packs per day. She has never used smokeless tobacco. She reports that she does not drink alcohol or use illicit drugs.  Allergies  Allergen Reactions  . Povidone Iodine Rash    Medications: I have reviewed the patient's current medications. Prior to Admission:  Prior to Admission medications   Medication Sig Start Date End Date Taking? Authorizing Provider  baclofen (LIORESAL) 20 MG tablet Take 20 mg by mouth 4 (four)  times daily.    Yes Historical Provider, MD  Multiple Vitamins-Minerals (MULTIVITAMIN WITH MINERALS) tablet Take 1 tablet by mouth daily.    Yes Historical Provider, MD  oxybutynin (DITROPAN) 5 MG tablet Take 5 mg by mouth 3 (three) times daily.   Yes Historical Provider, MD  tiZANidine (ZANAFLEX) 4 MG tablet Take 4 mg by mouth every 6 (six) hours as needed for muscle spasms.    Yes Historical Provider, MD    ROS: History obtained from the patient  General ROS: as noted in HPI Psychological ROS: negative for - behavioral disorder, hallucinations, memory difficulties, mood swings or suicidal ideation Ophthalmic ROS: negative for - blurry vision, double vision, eye pain or loss of vision ENT ROS: negative for - epistaxis, nasal discharge, oral lesions, sore throat, tinnitus or vertigo Allergy and Immunology ROS: negative for - hives or itchy/watery eyes Hematological and Lymphatic ROS: negative for - bleeding problems, bruising or swollen lymph nodes Endocrine ROS: negative for - galactorrhea, hair pattern changes, polydipsia/polyuria or temperature intolerance Respiratory ROS: cough Cardiovascular ROS: negative for - chest pain, dyspnea on exertion, edema or irregular heartbeat Gastrointestinal ROS: negative for - abdominal pain, diarrhea, hematemesis, nausea/vomiting or stool incontinence Genito-Urinary ROS:  as noted in HPI Musculoskeletal ROS: negative for - joint swelling Neurological ROS: as noted in HPI Dermatological ROS: negative for rash and skin lesion changes  Physical Examination: Blood pressure 150/69, pulse 93, temperature 98.9  F (37.2 C), temperature source Oral, resp. rate 17, height 5\' 11"  (1.803 m), weight 96.616 kg (213 lb), SpO2 99 %.  HEENT-  Normocephalic, no lesions, without obvious abnormality.  Normal external eye and conjunctiva.  Normal TM's bilaterally.  Normal auditory canals and external ears. Normal external nose, mucus membranes and septum.  Normal  pharynx. Cardiovascular- S1, S2 normal, pulses palpable throughout   Lungs- chest clear, no wheezing, rales, normal symmetric air entry Abdomen- soft, non-tender; bowel sounds normal; no masses,  no organomegaly Extremities- no edema Lymph-no adenopathy palpable Musculoskeletal-no joint tenderness, deformity or swelling Skin-warm and dry, no hyperpigmentation, vitiligo, or suspicious lesions  Neurological Examination Mental Status: Alert, oriented, thought content appropriate.  Speech fluent without evidence of aphasia.  Able to follow 3 step commands without difficulty. Cranial Nerves: II: Discs flat bilaterally; Visual fields grossly normal, pupils equal, round, reactive to light and accommodation III,IV, VI: ptosis not present, extra-ocular motions intact bilaterally V,VII: decrease in left NLF, facial light touch sensation normal bilaterally VIII: hearing normal bilaterally IX,X: gag reflex present XI: bilateral shoulder shrug XII: midline tongue extension Motor: Right : Upper extremity   5/5    Left:     Upper extremity   5/5 Unable to lift either lower extremity off the bed.  Able to wiggle toes on the left but only minimally so on the right Increased tone in the lower extremities Sensory: Pinprick and light touch decreased in the RLE Deep Tendon Reflexes: 3+ and symmetric.  3-4 beats of ankle clonus on the left.   Plantars: Right: upgoing   Left: upgoing Cerebellar: Normal finger-to-nose testing.  Unable to perform heel-to-shin testing due to weakness Gait: not tested due to safety concerns   Laboratory Studies:   Basic Metabolic Panel:  Recent Labs Lab 07/14/15 1046  NA 139  K 3.7  CL 107  CO2 23  GLUCOSE 97  BUN 16  CREATININE 0.55  CALCIUM 9.1    Liver Function Tests:  Recent Labs Lab 07/14/15 1046  AST 20  ALT 18  ALKPHOS 122  BILITOT 0.6  PROT 7.7  ALBUMIN 4.0    Recent Labs Lab 07/14/15 1048  LIPASE 14   No results for input(s): AMMONIA  in the last 168 hours.  CBC:  Recent Labs Lab 07/14/15 1046  WBC 8.1  NEUTROABS 6.4  HGB 13.1  HCT 37.6  MCV 77.5*  PLT 177    Cardiac Enzymes:  Recent Labs Lab 07/14/15 1046  TROPONINI <0.03    BNP: Invalid input(s): POCBNP  CBG: No results for input(s): GLUCAP in the last 168 hours.  Microbiology: Results for orders placed or performed during the hospital encounter of 07/14/15  Rapid Influenza A&B Antigens ([facility] only)     Status: None   Collection Time: 07/14/15 10:48 AM  Result Value Ref Range Status   Influenza A ([facility]) NEGATIVE NEGATIVE Final   Influenza B ([facility]) NEGATIVE NEGATIVE Final    Coagulation Studies: No results for input(s): LABPROT, INR in the last 72 hours.  Urinalysis:  Recent Labs Lab 07/14/15 1232  COLORURINE YELLOW*  LABSPEC 1.013  PHURINE 6.0  GLUCOSEU NEGATIVE  HGBUR 1+*  BILIRUBINUR NEGATIVE  KETONESUR 1+*  PROTEINUR NEGATIVE  NITRITE NEGATIVE  LEUKOCYTESUR NEGATIVE    Lipid Panel:     Component Value Date/Time   CHOL 178 11/27/2014 0956   TRIG 74 11/27/2014 0956   HDL 51 11/27/2014 0956   CHOLHDL 3.5 11/27/2014 0956   LDLCALC 112* 11/27/2014 1610  HgbA1C:  Lab Results  Component Value Date   HGBA1C 5.2 11/27/2014    Urine Drug Screen:  No results found for: LABOPIA, COCAINSCRNUR, LABBENZ, AMPHETMU, THCU, LABBARB  Alcohol Level: No results for input(s): ETH in the last 168 hours.  Other results: EKG: sinus rhythm at 85 bpm.  Imaging: Dg Chest 2 View  07/14/2015  CLINICAL DATA:  Weakness.  Head pressure. EXAM: CHEST  2 VIEW COMPARISON:  02/21/2013 FINDINGS: The heart size and mediastinal contours are within normal limits. Both lungs are clear. The visualized skeletal structures are unremarkable. IMPRESSION: No active cardiopulmonary disease. Electronically Signed   By: Signa Kell M.D.   On: 07/14/2015 12:53   Ct Head Wo Contrast  07/14/2015  CLINICAL DATA:  Right-sided weakness. Headache, complaining of  pain behind the eyes. Vomiting and nausea x 1. EXAM: CT HEAD WITHOUT CONTRAST TECHNIQUE: Contiguous axial images were obtained from the base of the skull through the vertex without intravenous contrast. COMPARISON:  None. FINDINGS: There is no evidence of mass effect, midline shift or extra-axial fluid collections. There is no evidence of a space-occupying lesion or intracranial hemorrhage. There is no evidence of a cortical-based area of acute infarction. Mild periventricular white matter low attenuation. The ventricles and sulci are appropriate for the patient's age. The basal cisterns are patent. Visualized portions of the orbits are unremarkable. The visualized portions of the paranasal sinuses and mastoid air cells are unremarkable. The osseous structures are unremarkable. IMPRESSION: No acute intracranial pathology. Electronically Signed   By: Elige Ko   On: 07/14/2015 10:39     Assessment/Plan: 55 year old female presenting with lower extremity weakness and numbness.  Patient with accompanying symptoms of low grade fever, nausea, vomiting and headache.  Patient reports that she has been stable with her MS for the past 5 years but that this is how her exacerbatios presente in thepast and they always presented with infection.  She is on no DMT per her choice.  Refuses MRI due to anxiety.  Work up shows normal wbc count and no evidence of PNA or UTI.  Currently afebrile.   Head CT personally reviewed and shows no acute changes.    Recommendations: 1.  Solumedrol 1000mg  IV X 3 days.  Will evaluate for the need of further steroids at the end of 3 days.   2.  GI prophylaxis 3.  Ambien prn for sleep 4.  PT evaluation 5.  DVT prophylaxis 6.  Will continue to follow with you  Thana Farr, MD Neurology [phone] 07/14/2015, 2:31 PM

## 2015-07-14 NOTE — ED Notes (Signed)
Patient presents to the ED via [facility] from home with right sided weakness, headache, complaining of pain behind her eyes, nausea and vomiting x 1.  Patient reports a fever and other cold symptoms for the past several days.  Patient has a history of MS and states that she often has "flare ups" when she has a cold.  Patient reports some epigastric pain.  Patient is nontender on palpation.  Per EMS patient is slightly weaker on the right side.  Patient states, "I just don't have much feeling in my legs."  Patient wears braces in both legs.  Patient cannot move her legs well at baseline.  Patient states she walks with a walker at home.

## 2015-07-14 NOTE — ED Provider Notes (Signed)
[facility] Emergency Department Provider Note  ____________________________________________  Time seen: Approximately 10:36 AM  I have reviewed the triage vital signs and the nursing notes.   HISTORY  Chief Complaint Weakness; Nausea; and Eye Pain    HPI Lisa Williams is a 55 y.o. female with history of multiple sclerosis presents for evaluation of nausea, right-sided weakness, one episode of nonbloody nonbilious emesis, epigastric pain radiating to her chest, subjective fevers and headache which began yesterday, gradual onset, constant since onset, currently moderate, no modifying factors. She reports that she feels the headache behind her eyes. She reports that often which is a viral illness she will have a flareup of her MS symptoms. She is complaining of feeling slightly weaker on the right side however has chronic weakness in both legs andambulates with a walker. No neck pain or neck stiffness.   Past Medical History  Diagnosis Date  . MS (multiple sclerosis) (HCC)   . HPV test positive   . Back pain     Patient Active Problem List   Diagnosis Date Noted  . Multiple sclerosis exacerbation (HCC) 07/14/2015  . MS (multiple sclerosis) (HCC) 11/26/2014  . Increased body mass index 11/26/2014  . HPV test positive 11/26/2014  . Status post laparoscopic supracervical hysterectomy 11/26/2014  . Galactorrhea 11/26/2014  . Tobacco user 11/26/2014  . Back ache 05/21/2014  . Adiposity 05/21/2014  . Disordered sleep 05/21/2014  . Muscle spasticity 05/21/2014  . Calculus of kidney 12/09/2013  . Renal colic 12/09/2013  . Hypercholesteremia 08/19/2013  . Hereditary and idiopathic neuropathy 08/19/2013  . Hypercholesterolemia without hypertriglyceridemia 08/19/2013  . Bladder infection, chronic 07/25/2012  . Disorder of bladder function 07/25/2012  . Incomplete bladder emptying 07/25/2012    Past Surgical History  Procedure Laterality Date  .  Laparoscopic supracervical hysterectomy  08/05/2013  . Bilateral tubal ligation  1996  . Knee surgery      right    Current Outpatient Rx  Name  Route  Sig  Dispense  Refill  . baclofen (LIORESAL) 20 MG tablet   Oral   Take 20 mg by mouth 4 (four) times daily.          . Multiple Vitamins-Minerals (MULTIVITAMIN WITH MINERALS) tablet   Oral   Take 1 tablet by mouth daily.          Marland Kitchen oxybutynin (DITROPAN) 5 MG tablet   Oral   Take 5 mg by mouth 3 (three) times daily.         Marland Kitchen tiZANidine (ZANAFLEX) 4 MG tablet   Oral   Take 4 mg by mouth every 6 (six) hours as needed for muscle spasms.            Allergies Povidone iodine  Family History  Problem Relation Age of Onset  . Sickle cell trait Sister   . Supraventricular tachycardia Sister   . Diabetes Maternal Grandmother   . Diabetes Maternal Aunt   . Breast cancer Paternal Grandmother   . Liver cancer Maternal Grandmother   . Hypertension Father   . Hypertension Sister   . Ovarian cancer Paternal Grandmother     Social History Social History  Substance Use Topics  . Smoking status: Current Every Day Smoker -- 0.50 packs/day    Types: Cigarettes  . Smokeless tobacco: Never Used  . Alcohol Use: No    Review of Systems Constitutional: No fever/chills Eyes: No visual changes. ENT: No sore throat. Cardiovascular: Denies chest pain. Respiratory: Denies shortness of breath.  Gastrointestinal: + abdominal pain.  + nausea, + vomiting.  No diarrhea.  No constipation. Genitourinary: Negative for dysuria. Musculoskeletal: Negative for back pain. Skin: Negative for rash. Neurological: Positive for headaches, positive for chronic focal weakness and numbness.  10-point ROS otherwise negative.  ____________________________________________   PHYSICAL EXAM:  VITAL SIGNS: ED Triage Vitals  Enc Vitals Group     BP 07/14/15 1005 179/88 mmHg     Pulse Rate 07/14/15 1005 97     Resp 07/14/15 1005 20     Temp  07/14/15 1005 98.9 F (37.2 C)     Temp Source 07/14/15 1005 Oral     SpO2 07/14/15 1001 98 %     Weight 07/14/15 1005 213 lb (96.616 kg)     Height 07/14/15 1005 5\' 11"  (1.803 m)     Head Cir --      Peak Flow --      Pain Score 07/14/15 1010 6     Pain Loc --      Pain Edu? --      Excl. in GC? --     Constitutional: Alert and oriented. Well appearing and in no acute distress. Eyes: Conjunctivae are normal. PERRL. EOMI. Head: Atraumatic. Nose: No congestion/rhinnorhea. Mouth/Throat: Mucous membranes are moist.  Oropharynx non-erythematous. Neck: No stridor.  Supple without meningismus. Cardiovascular: Normal rate, regular rhythm. Grossly normal heart sounds.  Good peripheral circulation. Respiratory: Normal respiratory effort.  No retractions. Lungs CTAB. Gastrointestinal: Soft and nontender. No distention.  No CVA tenderness. Genitourinary: deferred Musculoskeletal: No lower extremity tenderness nor edema.  No joint effusions. Neurologic:  Normal speech and language.  weakness in bilateral lower extremities, 2/5 strength. The lateral legs with braces. 4+ out of 5 strength in the right upper extremity, 5 out of 5 strength in the left upper extremity, sensation intact to light touch in bilateral upper extremities. Cranial nerves II through XII intact. Normal finger-nose-finger. Skin:  Skin is warm, dry and intact. No rash noted. Psychiatric: Mood and affect are normal. Speech and behavior are normal.  ____________________________________________   LABS (all labs ordered are listed, but only abnormal results are displayed)  Labs Reviewed  CBC WITH DIFFERENTIAL/PLATELET - Abnormal; Notable for the following:    MCV 77.5 (*)    Lymphs Abs 0.6 (*)    Monocytes Absolute 1.0 (*)    All other components within normal limits  URINALYSIS COMPLETEWITH MICROSCOPIC ([facility] ONLY) - Abnormal; Notable for the following:    Color, Urine YELLOW (*)    APPearance CLEAR (*)    Ketones, ur 1+  (*)    Hgb urine dipstick 1+ (*)    Squamous Epithelial / LPF 0-5 (*)    All other components within normal limits  RAPID INFLUENZA A&B ANTIGENS ([facility] ONLY)  COMPREHENSIVE METABOLIC PANEL  TROPONIN I  PREGNANCY, URINE  LIPASE, BLOOD   ____________________________________________  EKG  ED ECG REPORT I, Gayla Doss, the attending physician, personally viewed and interpreted this ECG.   Date: 07/14/2015  EKG Time: 10:09  Rate: 99  Rhythm: normal sinus rhythm  Axis: normal  Intervals:none  ST&T Change: No acute ST elevation.  ____________________________________________  RADIOLOGY  CT head  IMPRESSION: No acute intracranial pathology.   CXR IMPRESSION: No active cardiopulmonary disease. ____________________________________________   PROCEDURES  Procedure(s) performed: None  Critical Care performed: No  ____________________________________________   INITIAL IMPRESSION / ASSESSMENT AND PLAN / ED COURSE  Pertinent labs & imaging results that were available during my care of the patient  were reviewed by me and considered in my medical decision making (see chart for details).  Lisa Williams is a 55 y.o. female with history of multiple sclerosis presents for evaluation of nausea, one episode of nonbloody nonbilious emesis, epigastric pain radiating to her chest, subjective fevers and headache which began yesterday as well as weakness in the legs and right arm. On exam, she is generally nontoxic appearing and in no acute distress. Vital signs stable, she is afebrile. She is severely weak in both legs. She has question of the faintest weakness in terms of grip strength in the right arm but she has 5 out of 5 strength in the left and this has been going on since yesterday. Neck is supple without meningismus. Suspect  MS flare in the setting of a viral illness. Her abdomen exam is benign. We'll treat her symptoms, obtain screening labs, CT head, chest  x-ray.  ----------------------------------------- 1:50 PM on 07/14/2015 ----------------------------------------- Patient unable to stand and typically ambulates at home with a walker. Reports that she is having numbness in the right side and this is similar to her usual MS flares which she has not had in a very long time. I discussed the case with Dr. Thad Ranger of neurology who will evaluate the patient.  ----------------------------------------- 2:22 PM on 07/14/2015 ----------------------------------------- Thad Ranger evaluated the patient and recommends admission for exacerbation. Case discussed with hospice for admission at this time.  ____________________________________________   FINAL CLINICAL IMPRESSION(S) / ED DIAGNOSES  Final diagnoses:  Multiple sclerosis (HCC)      Gayla Doss, MD 07/14/15 1529

## 2015-07-15 NOTE — Progress Notes (Signed)
[facility] [place] at [facility]   PATIENT NAME: Lisa Williams    MR#:  [id number]  DATE OF BIRTH:  11/26/1960  SUBJECTIVE:  CHIEF COMPLAINT:   Chief Complaint  Patient presents with  . Weakness  . Nausea  . Eye Pain       came with worsening of multiple sclerosis and right-sided lower extremity weakness.      She had some upper respiratory viral symptoms for last few days which precipitated this episode.      Started on IV steroid and his recommendation from neurologist, today second day, and she feels significantly better.  REVIEW OF SYSTEMS:  CONSTITUTIONAL: No fever, fatigue or weakness.  EYES: No blurred or double vision.  EARS, NOSE, AND THROAT: No tinnitus or ear pain.  RESPIRATORY: No cough, shortness of breath, wheezing or hemoptysis.  CARDIOVASCULAR: No chest pain, orthopnea, edema.  GASTROINTESTINAL: No nausea, vomiting, diarrhea or abdominal pain.  GENITOURINARY: No dysuria, hematuria.  ENDOCRINE: No polyuria, nocturia,  HEMATOLOGY: No anemia, easy bruising or bleeding SKIN: No rash or lesion. MUSCULOSKELETAL: No joint pain or arthritis.   NEUROLOGIC: No tingling, numbness, right lower extremity weakness.  PSYCHIATRY: No anxiety or depression.   ROS  DRUG ALLERGIES:   Allergies  Allergen Reactions  . Povidone Iodine Rash    VITALS:  Blood pressure 149/69, pulse 61, temperature 98.2 F (36.8 C), temperature source Oral, resp. rate 19, height  (1.803 m), weight 96.616 kg (213 lb), SpO2 98 %.  PHYSICAL EXAMINATION:  GENERAL:  55 y.o.-year-old patient lying in the bed with no acute distress.  EYES: Pupils equal, round, reactive to light and accommodation. No scleral icterus. Extraocular muscles intact.  HEENT: Head atraumatic, normocephalic. Oropharynx and nasopharynx clear.  NECK:  Supple, no jugular venous distention. No thyroid enlargement, no tenderness.  LUNGS: Normal breath sounds bilaterally, no wheezing,  rales,rhonchi or crepitation. No use of accessory muscles of respiration.  CARDIOVASCULAR: S1, S2 normal. No murmurs, rubs, or gallops.  ABDOMEN: Soft, nontender, nondistended. Bowel sounds present. No organomegaly or mass.  EXTREMITIES: No pedal edema, cyanosis, or clubbing.  NEUROLOGIC: Cranial nerves II through XII are intact. Muscle strength 5/5 in both upper extremities, 1-2 /5 in right lower and 4/5 in left lower. Sensation intact. Gait not checked.  PSYCHIATRIC: The patient is alert and oriented x 3.  SKIN: No obvious rash, lesion, or ulcer.   Physical Exam LABORATORY PANEL:   CBC  Recent Labs Lab 07/14/15 1046  WBC 8.1  HGB 13.1  HCT 37.6  PLT 177   ------------------------------------------------------------------------------------------------------------------  Chemistries   Recent Labs Lab 07/14/15 1046  NA 139  K 3.7  CL 107  CO2 23  GLUCOSE 97  BUN 16  CREATININE 0.55  CALCIUM 9.1  AST 20  ALT 18  ALKPHOS 122  BILITOT 0.6   ------------------------------------------------------------------------------------------------------------------  Cardiac Enzymes  Recent Labs Lab 07/14/15 1046  TROPONINI <0.03   ------------------------------------------------------------------------------------------------------------------  RADIOLOGY:  Dg Chest 2 View  07/14/2015  CLINICAL DATA:  Weakness.  Head pressure. EXAM: CHEST  2 VIEW COMPARISON:  02/21/2013 FINDINGS: The heart size and mediastinal contours are within normal limits. Both lungs are clear. The visualized skeletal structures are unremarkable. IMPRESSION: No active cardiopulmonary disease. Electronically Signed   By: Signa Kell M.D.   On: 07/14/2015 12:53   Ct Head Wo Contrast  07/14/2015  CLINICAL DATA:  Right-sided weakness. Headache, complaining of pain behind the eyes. Vomiting and nausea x 1. EXAM: CT HEAD WITHOUT  CONTRAST TECHNIQUE: Contiguous axial images were obtained from the base of the  skull through the vertex without intravenous contrast. COMPARISON:  None. FINDINGS: There is no evidence of mass effect, midline shift or extra-axial fluid collections. There is no evidence of a space-occupying lesion or intracranial hemorrhage. There is no evidence of a cortical-based area of acute infarction. Mild periventricular white matter low attenuation. The ventricles and sulci are appropriate for the patient's age. The basal cisterns are patent. Visualized portions of the orbits are unremarkable. The visualized portions of the paranasal sinuses and mastoid air cells are unremarkable. The osseous structures are unremarkable. IMPRESSION: No acute intracranial pathology. Electronically Signed   By: Elige Ko   On: 07/14/2015 10:39    ASSESSMENT AND PLAN:   Active Problems:   Multiple sclerosis exacerbation (HCC)  #1 MS exacerbation- appreciate neurology consult  neuro checks, IV solumedrol- for 3 days - PT consult - cont her baclofen for muscle spasms  #2 Neurogenic bladder- follows with Urology as outpatient. Cont home meds  #3 Chronic back pain- pain meds as needed  #4 DVT Prophylaxis- lovenox  #5 Tobacco use disorder- counseled against smoking for almost 3 min. Feels ready to quit.    All the records are reviewed and case discussed with Care Management/Social Workerr. Management plans discussed with the patient, family and they are in agreement.  CODE STATUS: Full.  TOTAL TIME TAKING CARE OF THIS PATIENT: 35 minutes.    POSSIBLE D/C IN 1-2 DAYS, DEPENDING ON CLINICAL CONDITION.   Altamese Dilling M.D on 07/15/2015   Between 7am to 6pm - Pager - [phone]  After 6pm go to [email or web address] - password [email or web address]  [facility]  Office  [phone]  CC: Primary care physician; Lauro Regulus., MD  Note: This dictation was prepared with Dragon dictation along with smaller phrase technology. Any transcriptional errors that result from this  process are unintentional.

## 2015-07-15 NOTE — Progress Notes (Signed)
Subjective: Patient reports improvement today.  Strength and sensation improved.    Objective: Current vital signs: BP 131/70 mmHg  Pulse 56  Temp(Src) 97.9 F (36.6 C) (Oral)  Resp 18  Ht 5\' 11"  (1.803 m)  Wt 96.616 kg (213 lb)  BMI 29.72 kg/m2  SpO2 99% Vital signs in last 24 hours: Temp:  [97.9 F (36.6 C)-98.7 F (37.1 C)] 97.9 F (36.6 C) (03/09 0800) Pulse Rate:  [56-98] 56 (03/09 0800) Resp:  [15-19] 18 (03/09 0800) BP: (131-171)/(64-82) 131/70 mmHg (03/09 0800) SpO2:  [94 %-100 %] 99 % (03/09 0800)  Intake/Output from previous day: 03/08 0701 - 03/09 0700 In: 298 [P.O.:240; IV Piggyback:58] Out: 0  Intake/Output this shift: Total I/O In: 240 [P.O.:240] Out: 0  Nutritional status: Diet regular Room service appropriate?: Yes; Fluid consistency:: Thin  Neurologic Exam: Mental Status: Alert, oriented, thought content appropriate.  Speech fluent without evidence of aphasia.  Able to follow 3 step commands without difficulty. Cranial Nerves: II: Discs flat bilaterally; Visual fields grossly normal, pupils equal, round, reactive to light and accommodation III,IV, VI: ptosis not present, extra-ocular motions intact bilaterally V,VII: smile symmetric, facial light touch sensation normal bilaterally VIII: hearing normal bilaterally IX,X: gag reflex present XI: bilateral shoulder shrug XII: midline tongue extension Motor: Able to lift both legs a few inches off the bed and able to wiggle the toes bilaterally Sensation: Temperature sensation intact in both lower extremities   Lab Results: Basic Metabolic Panel:  Recent Labs Lab 07/14/15 1046  NA 139  K 3.7  CL 107  CO2 23  GLUCOSE 97  BUN 16  CREATININE 0.55  CALCIUM 9.1    Liver Function Tests:  Recent Labs Lab 07/14/15 1046  AST 20  ALT 18  ALKPHOS 122  BILITOT 0.6  PROT 7.7  ALBUMIN 4.0    Recent Labs Lab 07/14/15 1048  LIPASE 14   No results for input(s): AMMONIA in the last 168  hours.  CBC:  Recent Labs Lab 07/14/15 1046  WBC 8.1  NEUTROABS 6.4  HGB 13.1  HCT 37.6  MCV 77.5*  PLT 177    Cardiac Enzymes:  Recent Labs Lab 07/14/15 1046  TROPONINI <0.03    Lipid Panel: No results for input(s): CHOL, TRIG, HDL, CHOLHDL, VLDL, LDLCALC in the last 168 hours.  CBG: No results for input(s): GLUCAP in the last 168 hours.  Microbiology: Results for orders placed or performed during the hospital encounter of 07/14/15  Rapid Influenza A&B Antigens ([facility] only)     Status: None   Collection Time: 07/14/15 10:48 AM  Result Value Ref Range Status   Influenza A ([facility]) NEGATIVE NEGATIVE Final   Influenza B ([facility]) NEGATIVE NEGATIVE Final    Coagulation Studies: No results for input(s): LABPROT, INR in the last 72 hours.  Imaging: Dg Chest 2 View  07/14/2015  CLINICAL DATA:  Weakness.  Head pressure. EXAM: CHEST  2 VIEW COMPARISON:  02/21/2013 FINDINGS: The heart size and mediastinal contours are within normal limits. Both lungs are clear. The visualized skeletal structures are unremarkable. IMPRESSION: No active cardiopulmonary disease. Electronically Signed   By: Signa Kell M.D.   On: 07/14/2015 12:53   Ct Head Wo Contrast  07/14/2015  CLINICAL DATA:  Right-sided weakness. Headache, complaining of pain behind the eyes. Vomiting and nausea x 1. EXAM: CT HEAD WITHOUT CONTRAST TECHNIQUE: Contiguous axial images were obtained from the base of the skull through the vertex without intravenous contrast. COMPARISON:  None. FINDINGS: There is  no evidence of mass effect, midline shift or extra-axial fluid collections. There is no evidence of a space-occupying lesion or intracranial hemorrhage. There is no evidence of a cortical-based area of acute infarction. Mild periventricular white matter low attenuation. The ventricles and sulci are appropriate for the patient's age. The basal cisterns are patent. Visualized portions of the orbits are unremarkable. The visualized  portions of the paranasal sinuses and mastoid air cells are unremarkable. The osseous structures are unremarkable. IMPRESSION: No acute intracranial pathology. Electronically Signed   By: Elige Ko   On: 07/14/2015 10:39    Medications:  I have reviewed the patient's current medications. Scheduled: . baclofen  20 mg Oral QID  . enoxaparin (LOVENOX) injection  40 mg Subcutaneous Q24H  . methylPREDNISolone (SOLU-MEDROL) injection  1,000 mg Intravenous Daily  . multivitamin with minerals  1 tablet Oral Daily  . oxybutynin  5 mg Oral TID    Assessment/Plan: Patient day #2 of 3 for Solumedrol.  Tolerating well.  Improvement noted in neurological examination.    Recommendations: 1.  Will re-evaluate in AM   LOS: 1 day   Thana Farr, MD Neurology [phone] 07/15/2015  11:42 AM

## 2015-07-16 NOTE — Progress Notes (Signed)
[facility] - [place] at [facility]   PATIENT NAME: Lisa Williams    MR#:  [id number]  DATE OF BIRTH:  1961-03-19  SUBJECTIVE:  CHIEF COMPLAINT:   Chief Complaint  Patient presents with  . Weakness  . Nausea  . Eye Pain       came with worsening of multiple sclerosis and right-sided lower extremity weakness.      She had some upper respiratory viral symptoms for last few days which precipitated this episode.      Started on IV steroid and his recommendation from neurologist, today third day, and she feels significantly better.    Walked with a walker with PT.  REVIEW OF SYSTEMS:  CONSTITUTIONAL: No fever, fatigue or weakness.  EYES: No blurred or double vision.  EARS, NOSE, AND THROAT: No tinnitus or ear pain.  RESPIRATORY: No cough, shortness of breath, wheezing or hemoptysis.  CARDIOVASCULAR: No chest pain, orthopnea, edema.  GASTROINTESTINAL: No nausea, vomiting, diarrhea or abdominal pain.  GENITOURINARY: No dysuria, hematuria.  ENDOCRINE: No polyuria, nocturia,  HEMATOLOGY: No anemia, easy bruising or bleeding SKIN: No rash or lesion. MUSCULOSKELETAL: No joint pain or arthritis.   NEUROLOGIC: No tingling, numbness, right lower extremity weakness.  PSYCHIATRY: No anxiety or depression.   ROS  DRUG ALLERGIES:   Allergies  Allergen Reactions  . Povidone Iodine Rash    VITALS:  Blood pressure 164/76, pulse 81, temperature 98.1 F (36.7 C), temperature source Oral, resp. rate 18, height  (1.803 m), weight 96.616 kg (213 lb), SpO2 96 %.  PHYSICAL EXAMINATION:  GENERAL:  55 y.o.-year-old patient lying in the bed with no acute distress.  EYES: Pupils equal, round, reactive to light and accommodation. No scleral icterus. Extraocular muscles intact.  HEENT: Head atraumatic, normocephalic. Oropharynx and nasopharynx clear.  NECK:  Supple, no jugular venous distention. No thyroid enlargement, no tenderness.  LUNGS: Normal breath sounds  bilaterally, no wheezing, rales,rhonchi or crepitation. No use of accessory muscles of respiration.  CARDIOVASCULAR: S1, S2 normal. No murmurs, rubs, or gallops.  ABDOMEN: Soft, nontender, nondistended. Bowel sounds present. No organomegaly or mass.  EXTREMITIES: No pedal edema, cyanosis, or clubbing.  NEUROLOGIC: Cranial nerves II through XII are intact. Muscle strength 5/5 in both upper extremities, 3 /5 in right lower and 4/5 in left lower. Sensation intact. Gait not checked.  PSYCHIATRIC: The patient is alert and oriented x 3.  SKIN: No obvious rash, lesion, or ulcer.   Physical Exam LABORATORY PANEL:   CBC  Recent Labs Lab 07/14/15 1046  WBC 8.1  HGB 13.1  HCT 37.6  PLT 177   ------------------------------------------------------------------------------------------------------------------  Chemistries   Recent Labs Lab 07/14/15 1046  NA 139  K 3.7  CL 107  CO2 23  GLUCOSE 97  BUN 16  CREATININE 0.55  CALCIUM 9.1  AST 20  ALT 18  ALKPHOS 122  BILITOT 0.6   ------------------------------------------------------------------------------------------------------------------  Cardiac Enzymes  Recent Labs Lab 07/14/15 1046  TROPONINI <0.03   ------------------------------------------------------------------------------------------------------------------  RADIOLOGY:  No results found.  ASSESSMENT AND PLAN:   Active Problems:   Multiple sclerosis exacerbation (HCC)  #1 MS exacerbation- appreciate neurology consult  neuro checks, IV solumedrol- for 3 days - PT consult appreciated, may need HHA on d/c - cont her baclofen for muscle spasms - neurologist want to keep her in hospital tonight , to watch for improvement.  #2 Neurogenic bladder- follows with Urology as outpatient. Cont home meds  #3 Chronic back pain- pain meds as needed  #  4 DVT Prophylaxis- lovenox  #5 Tobacco use disorder- counseled against smoking for almost 3 min. Feels ready to  quit.   All the records are reviewed and case discussed with Care Management/Social Workerr. Management plans discussed with the patient, family and they are in agreement.  CODE STATUS: Full.  TOTAL TIME TAKING CARE OF THIS PATIENT: 35 minutes.   POSSIBLE D/C IN 1 DAYS, DEPENDING ON CLINICAL CONDITION.   Altamese Dilling M.D on 07/16/2015   Between 7am to 6pm - Pager - [phone]  After 6pm go to [email or web address] - password [email or web address]  [facility]  Office  [phone]  CC: Primary care physician; Lauro Regulus., MD  Note: This dictation was prepared with Dragon dictation along with smaller phrase technology. Any transcriptional errors that result from this process are unintentional.

## 2015-07-16 NOTE — Evaluation (Signed)
Physical Therapy Evaluation Patient Details Name: Lisa Williams MRN: [id number] DOB: 1961-04-22 Today's Date: 07/16/2015   History of Present Illness  Pt is a 55 y.o. female with PMH of multiple sclerosis for 21 years, neurogenic bladder, HPV positive and backpain.  Pt presented with R LE weakness and generalized weakness.  Pt was admitted for multiple sclerosis exacerbation.      Clinical Impression  Prior to admission pt was independent with RW and B AFO's; however, pt reported she recently had a fall.  Pt lives with sister and mother. Pt was supervision for sit to stand with RW and CGA for ambulation of 120 feet with RW.  When pt fatigued, her R LE did not clear the floor during early swing phase with ambulation.  Due to aforementioned function and strength deficits, pt is in need of skilled physical therapy.  It is recommended that pt is discharged to home with familial support when medically appropriate.     Follow Up Recommendations Outpatient PT (Pt denied PT follow up due to pt report that R LE weakness usually resolves in about a week  )    Equipment Recommendations  Rolling walker with 5" wheels    Recommendations for Other Services       Precautions / Restrictions Precautions Precautions: Fall Restrictions Weight Bearing Restrictions: No      Mobility  Bed Mobility               General bed mobility comments: Was sitting EOB at beginning and end of session  Transfers Overall transfer level: Needs assistance Equipment used: Rolling walker (2 wheeled) Transfers: Sit to/from Stand Sit to Stand: Supervision            Ambulation/Gait Ambulation/Gait assistance: Min guard Ambulation Distance (Feet): 120 Feet Assistive device: Rolling walker (2 wheeled)   Gait velocity: decreased   General Gait Details: Pt utilizes B rigid AFO's.  Vaulting on B LE, B hip hiking. When pt began to fatigue, R LE did not clear floor during early swing phase.   Increased UE weight bearing on RW throughout gait   Pt reported that she has B foot drop without B AFO's.  Stairs            Wheelchair Mobility    Modified Rankin (Stroke Patients Only)       Balance Overall balance assessment: Needs assistance Sitting-balance support: Feet supported Sitting balance-Leahy Scale: Good     Standing balance support: Bilateral upper extremity supported (RW) Standing balance-Leahy Scale: Good                               Pertinent Vitals/Pain Pain Assessment: No/denies pain  See flow sheet for vitals.     Home Living Family/patient expects to be discharged to:: Private residence   Available Help at Discharge: Family   Home Access: Stairs to enter Entrance Stairs-Rails: Right Entrance Stairs-Number of Steps: 1 Home Layout: One level Home Equipment: Environmental consultant - 2 wheels      Prior Function Level of Independence: Independent with assistive device(s)         Comments: RW, pt reported she has had one fall recently      Hand Dominance        Extremity/Trunk Assessment   Upper Extremity Assessment: Overall WFL for tasks assessed           Lower Extremity Assessment: Generalized weakness  Sensation:  B L1 and S2  WFL R<L sensation: L2-L5   Strength: R: Quadriceps: 2/5, hamstrings: 2-/5; dorsiflexors: 2-/5; plantarflexors: 3 L: quadriceps: 3/5; hamstrings: 3+; dorsiflexors: 3-/5; plantarflexors: 3  Pt reported she cannot perform B inversion or eversion.     Cervical / Trunk Assessment: Normal  Communication   Communication: No difficulties  Cognition Arousal/Alertness: Awake/alert Behavior During Therapy: WFL for tasks assessed/performed Overall Cognitive Status: Within Functional Limits for tasks assessed                      General Comments   Nursing was contacted and cleared pt for physical therapy.  At beginning of session, pt was coming back from the bathroom with RW and sister CGA.   Pt was agreeable and session was modified due to fatigue.  Pt's sister, aunt and cousin were present during session. Nursing cleared pt to not have bed alarm on.    Exercises        Assessment/Plan    PT Assessment Patient needs continued PT services  PT Diagnosis Generalized weakness   PT Problem List Decreased strength;Decreased range of motion;Decreased activity tolerance;Decreased balance;Decreased mobility;Decreased coordination  PT Treatment Interventions DME instruction;Gait training;Stair training;Functional mobility training;Therapeutic activities;Therapeutic exercise;Balance training;Patient/family education   PT Goals (Current goals can be found in the Care Plan section) Acute Rehab PT Goals Patient Stated Goal: to go home PT Goal Formulation: With patient Time For Goal Achievement: 07/30/15 Potential to Achieve Goals: Good    Frequency Min 2X/week   Barriers to discharge        Co-evaluation               End of Session Equipment Utilized During Treatment: Gait belt Activity Tolerance: Patient limited by fatigue Patient left: in bed (Sitting EOB ) Nurse Communication: Mobility status         Time: 1610-9604 PT Time Calculation (min) (ACUTE ONLY): 31 min   Charges:         PT G Codes:       Lyndel Safe, SPT Lyndel Safe 07/16/2015, 3:13 PM

## 2015-07-16 NOTE — Care Management (Signed)
Rx for rolling walker requested from MD. Rolling walker requested from [facility] with [facility].

## 2015-07-16 NOTE — Progress Notes (Signed)
Subjective: Patient reports  Improvement today. Sitting on the edge of the bed but has not had a chance to ambulate.  Tolerating Solumedrol without noted side effects.    Objective: Current vital signs: BP 164/76 mmHg  Pulse 55  Temp(Src) 98.1 F (36.7 C) (Oral)  Resp 18  Ht 5\' 11"  (1.803 m)  Wt 96.616 kg (213 lb)  BMI 29.72 kg/m2  SpO2 99% Vital signs in last 24 hours: Temp:  [98.1 F (36.7 C)-98.4 F (36.9 C)] 98.1 F (36.7 C) (03/10 0836) Pulse Rate:  [55-61] 55 (03/10 0836) Resp:  [18-20] 18 (03/10 0836) BP: (137-164)/(68-80) 164/76 mmHg (03/10 0836) SpO2:  [94 %-99 %] 99 % (03/10 0836)  Intake/Output from previous day: 03/09 0701 - 03/10 0700 In: 2131.8 [P.O.:480; I.V.:1593.8; IV Piggyback:58] Out: 300 [Urine:300] Intake/Output this shift:   Nutritional status: Diet regular Room service appropriate?: Yes; Fluid consistency:: Thin  Neurologic Exam: Mental Status: Alert, oriented, thought content appropriate. Speech fluent without evidence of aphasia. Able to follow 3 step commands without difficulty. Cranial Nerves: II: Discs flat bilaterally; Visual fields grossly normal, pupils equal, round, reactive to light and accommodation III,IV, VI: ptosis not present, extra-ocular motions intact bilaterally V,VII: smile symmetric, facial light touch sensation normal bilaterally VIII: hearing normal bilaterally IX,X: gag reflex present XI: bilateral shoulder shrug XII: midline tongue extension Motor: Able to kick both legs to full extension while seated on the side of the bed.   Sensation: Temperature sensation intact in both lower extremities   Lab Results: Basic Metabolic Panel:  Recent Labs Lab 07/14/15 1046  NA 139  K 3.7  CL 107  CO2 23  GLUCOSE 97  BUN 16  CREATININE 0.55  CALCIUM 9.1    Liver Function Tests:  Recent Labs Lab 07/14/15 1046  AST 20  ALT 18  ALKPHOS 122  BILITOT 0.6  PROT 7.7  ALBUMIN 4.0    Recent Labs Lab 07/14/15 1048   LIPASE 14   No results for input(s): AMMONIA in the last 168 hours.  CBC:  Recent Labs Lab 07/14/15 1046  WBC 8.1  NEUTROABS 6.4  HGB 13.1  HCT 37.6  MCV 77.5*  PLT 177    Cardiac Enzymes:  Recent Labs Lab 07/14/15 1046  TROPONINI <0.03    Lipid Panel: No results for input(s): CHOL, TRIG, HDL, CHOLHDL, VLDL, LDLCALC in the last 168 hours.  CBG: No results for input(s): GLUCAP in the last 168 hours.  Microbiology: Results for orders placed or performed during the hospital encounter of 07/14/15  Rapid Influenza A&B Antigens ([facility] only)     Status: None   Collection Time: 07/14/15 10:48 AM  Result Value Ref Range Status   Influenza A ([facility]) NEGATIVE NEGATIVE Final   Influenza B ([facility]) NEGATIVE NEGATIVE Final    Coagulation Studies: No results for input(s): LABPROT, INR in the last 72 hours.  Imaging: Dg Chest 2 View  07/14/2015  CLINICAL DATA:  Weakness.  Head pressure. EXAM: CHEST  2 VIEW COMPARISON:  02/21/2013 FINDINGS: The heart size and mediastinal contours are within normal limits. Both lungs are clear. The visualized skeletal structures are unremarkable. IMPRESSION: No active cardiopulmonary disease. Electronically Signed   By: Signa Kell M.D.   On: 07/14/2015 12:53   Ct Head Wo Contrast  07/14/2015  CLINICAL DATA:  Right-sided weakness. Headache, complaining of pain behind the eyes. Vomiting and nausea x 1. EXAM: CT HEAD WITHOUT CONTRAST TECHNIQUE: Contiguous axial images were obtained from the base of the skull through  the vertex without intravenous contrast. COMPARISON:  None. FINDINGS: There is no evidence of mass effect, midline shift or extra-axial fluid collections. There is no evidence of a space-occupying lesion or intracranial hemorrhage. There is no evidence of a cortical-based area of acute infarction. Mild periventricular white matter low attenuation. The ventricles and sulci are appropriate for the patient's age. The basal cisterns are patent.  Visualized portions of the orbits are unremarkable. The visualized portions of the paranasal sinuses and mastoid air cells are unremarkable. The osseous structures are unremarkable. IMPRESSION: No acute intracranial pathology. Electronically Signed   By: Elige Ko   On: 07/14/2015 10:39    Medications:  I have reviewed the patient's current medications. Scheduled: . baclofen  20 mg Oral QID  . enoxaparin (LOVENOX) injection  40 mg Subcutaneous Q24H  . methylPREDNISolone (SOLU-MEDROL) injection  1,000 mg Intravenous Daily  . multivitamin with minerals  1 tablet Oral Daily  . oxybutynin  5 mg Oral TID    Assessment/Plan: Today is day #3 of 3 of Solumedrol.  Tolerating well. Improvement noted.    Recommendations: 1.  PT evaluation 2.  If no worsening of neurological function overnight patient should be stable for discharge in AM.  No steroid taper required.  Follow up with neurology on an outpatient basis.     LOS: 2 days   Thana Farr, MD Neurology [phone] 07/16/2015  9:11 AM

## 2015-07-16 NOTE — Care Management Important Message (Signed)
Important Message  Patient Details  Name: Lisa Williams MRN: [id number] Date of Birth: October 06, 1960   Medicare Important Message Given:  Yes    Olegario Messier A Suleima Ohlendorf 07/16/2015, 11:16 AM

## 2015-07-17 LAB — CBC
HCT: 36 % (ref 35.0–47.0)
HEMOGLOBIN: 12.3 g/dL (ref 12.0–16.0)
MCH: 26.8 pg (ref 26.0–34.0)
MCHC: 34.1 g/dL (ref 32.0–36.0)
MCV: 78.6 fL — ABNORMAL LOW (ref 80.0–100.0)
PLATELETS: 158 10*3/uL (ref 150–440)
RBC: 4.57 MIL/uL (ref 3.80–5.20)
RDW: 14 % (ref 11.5–14.5)
WBC: 14.5 10*3/uL — ABNORMAL HIGH (ref 3.6–11.0)

## 2015-07-17 LAB — CREATININE, SERUM: CREATININE: 0.65 mg/dL (ref 0.44–1.00)

## 2015-07-17 MED ORDER — AMLODIPINE BESYLATE 5 MG PO TABS
5.0000 mg | ORAL_TABLET | Freq: Once | ORAL | Status: AC
  Administered 2015-07-17: 5 mg via ORAL
  Filled 2015-07-17: qty 1

## 2015-07-17 MED ORDER — DIAZEPAM 5 MG PO TABS: 5.0000 mg | ORAL_TABLET | Freq: Two times a day (BID) | ORAL | Status: DC

## 2015-07-17 MED ORDER — HYDRALAZINE HCL 20 MG/ML IJ SOLN
10.0000 mg | Freq: Once | INTRAMUSCULAR | Status: AC
  Administered 2015-07-17: 10 mg via INTRAVENOUS
  Filled 2015-07-17: qty 1

## 2015-07-17 MED ORDER — AMLODIPINE BESYLATE 5 MG PO TABS: 5.0000 mg | ORAL_TABLET | Freq: Every day | ORAL | Status: DC

## 2015-07-17 NOTE — Progress Notes (Addendum)
Order to discharge patient to home today, discharge instructions given per MD order, home and new medication reviewed, patient to pick up norvasc from [facility] pharmacy.  Patient verbalized understanding of discharge instructions given. Family at the bedside.  Patient discharge via wheelchair with nurse aidToni Amend.

## 2015-07-17 NOTE — Progress Notes (Signed)
Spoke with Dr.Vachhani pt's blood pressure 179/75, hr- 44 Order received to give hydralazine 10 mg iv x 1.

## 2015-07-17 NOTE — Progress Notes (Signed)
Spoke with Dr.Vachhani regarding pt blood pressure of 164/73. MD to prescribed norvasc for home use.

## 2015-07-17 NOTE — Care Management Important Message (Signed)
Important Message  Patient Details  Name: Lisa Williams MRN: [id number] Date of Birth: 07/26/1960   Medicare Important Message Given:  Yes    Yalissa Fink A, RN 07/17/2015, 3:53 PM

## 2015-07-17 NOTE — Progress Notes (Signed)
Called to room by patient- states " my nose started to bleed again". Bleeding noted left nare.  Dr.Vachhani here to see patient , MD to place order for ENT consult.

## 2015-07-17 NOTE — Consult Note (Signed)
Lisa Williams, Lisa Williams [id number] 11/01/1960 Altamese Dilling, MD  Reason for Consult: Epistaxis  HPI: The patient is a 55 year old African-American female who has had multiple sclerosis for 21 years. She was admitted to the hospital with an acute flareup and sneezing right leg weakness and generalized muscle weakness. She has been on a steroid burst to help control this and some baclofen for back pain. She had had some slight bleeding from her nose the last day or 2, and feeling like it was crusty and dry. This morning when she was about to be discharged and had bright red bleeding from her left nostril. She placed a rolled up napkins and her nose and it helped to control it. Her blood pressure was elevated and she was given some hydralazine cephalad down. She has not had any bleeding now in the last 2-1/2 hours. Assessments made of her nose to see if we need to do anything else help control the bleeding  Allergies:  Allergies  Allergen Reactions  . Povidone Iodine Rash    ROS: Review of systems normal other than 12 systems except per HPI.  PMH:  Past Medical History  Diagnosis Date  . MS (multiple sclerosis) (HCC)   . HPV test positive   . Back pain     FH:  Family History  Problem Relation Age of Onset  . Sickle cell trait Sister   . Supraventricular tachycardia Sister   . Diabetes Maternal Grandmother   . Diabetes Maternal Aunt   . Breast cancer Paternal Grandmother   . Liver cancer Maternal Grandmother   . Hypertension Father   . Hypertension Sister   . Ovarian cancer Paternal Grandmother     SH:  Social History   Social History  . Marital Status: Single    Spouse Name: N/A  . Number of Children: N/A  . Years of Education: N/A   Occupational History  . Not on file.   Social History Main Topics  . Smoking status: Current Every Day Smoker -- 0.50 packs/day    Types: Cigarettes  . Smokeless tobacco: Never Used  . Alcohol Use: No  . Drug Use: No  . Sexual  Activity: No   Other Topics Concern  . Not on file   Social History Narrative   Lives at home with parents. Has a walker for ambulation.    PSH:  Past Surgical History  Procedure Laterality Date  . Laparoscopic supracervical hysterectomy  08/05/2013  . Bilateral tubal ligation  1996  . Knee surgery      right    Physical  Exam: The nose has of piece of paper towel sticking out the left side. The paper towel is white and there is no significant blood staining on it at all. There is no blood staining in the right nostril. I gently removed the paper and it is not stuck to any bleeding sites. There is no significant clots in the nose. Her airway looks open on both sides and there is no bleeding at all.   A/P: She has had a dry nose and some epistaxis. This seems to be currently stopped. Her blood pressure seems to be under good control right now. I gave her instructions on how to pinch the nose if she should have bleeding started up again and what to do if it should not stop. She's never had nosebleeds like this before so she should not need specific follow-up as its unlikely this will continue. She will get humidification at home and use  Vaseline to help lubricate the inside of her nose. If she has further problems she is welcome to follow-up in ENT office, but if she is doing well she will not need further follow-up. She is okay to be discharged home currently.   Yuleidy Rappleye H 07/17/2015 2:50 PM

## 2015-07-17 NOTE — Progress Notes (Signed)
Subjective: No new complaints.  Feels that she is doing well.  Has ambulated with therapy  Objective: Current vital signs: BP 179/75 mmHg  Pulse 44  Temp(Src) 98.1 F (36.7 C) (Oral)  Resp 16  Ht 5\' 11"  (1.803 m)  Wt 96.616 kg (213 lb)  BMI 29.72 kg/m2  SpO2 100% Vital signs in last 24 hours: Temp:  [98.1 F (36.7 C)-99.1 F (37.3 C)] 98.1 F (36.7 C) (03/11 0726) Pulse Rate:  [44-72] 44 (03/11 1052) Resp:  [16-18] 16 (03/11 0726) BP: (136-179)/(55-75) 179/75 mmHg (03/11 1052) SpO2:  [92 %-100 %] 100 % (03/11 1052)  Intake/Output from previous day: 03/10 0701 - 03/11 0700 In: 480 [P.O.:480] Out: 0  Intake/Output this shift: Total I/O In: 120 [P.O.:120] Out: -  Nutritional status: Diet regular Room service appropriate?: Yes; Fluid consistency:: Thin Diet - low sodium heart healthy  Neurologic Exam: Mental Status: Alert, oriented, thought content appropriate. Speech fluent without evidence of aphasia. Able to follow 3 step commands without difficulty. Cranial Nerves: II: Discs flat bilaterally; Visual fields grossly normal, pupils equal, round, reactive to light and accommodation III,IV, VI: ptosis not present, extra-ocular motions intact bilaterally V,VII: smile symmetric, facial light touch sensation normal bilaterally VIII: hearing normal bilaterally IX,X: gag reflex present XI: bilateral shoulder shrug XII: midline tongue extension Motor: Able to kick both legs to full extension while seated on the side of the bed.  Sensation: Temperature sensation intact in both lower extremities Gait: Ambulates with walker and bilateral AFO's, external rotation of the RLE.     Lab Results: Basic Metabolic Panel:  Recent Labs Lab 07/14/15 1046 07/17/15 0758  NA 139  --   K 3.7  --   CL 107  --   CO2 23  --   GLUCOSE 97  --   BUN 16  --   CREATININE 0.55 0.65  CALCIUM 9.1  --     Liver Function Tests:  Recent Labs Lab 07/14/15 1046  AST 20  ALT 18   ALKPHOS 122  BILITOT 0.6  PROT 7.7  ALBUMIN 4.0    Recent Labs Lab 07/14/15 1048  LIPASE 14   No results for input(s): AMMONIA in the last 168 hours.  CBC:  Recent Labs Lab 07/14/15 1046  WBC 8.1  NEUTROABS 6.4  HGB 13.1  HCT 37.6  MCV 77.5*  PLT 177    Cardiac Enzymes:  Recent Labs Lab 07/14/15 1046  TROPONINI <0.03    Lipid Panel: No results for input(s): CHOL, TRIG, HDL, CHOLHDL, VLDL, LDLCALC in the last 168 hours.  CBG: No results for input(s): GLUCAP in the last 168 hours.  Microbiology: Results for orders placed or performed during the hospital encounter of 07/14/15  Rapid Influenza A&B Antigens ([facility] only)     Status: None   Collection Time: 07/14/15 10:48 AM  Result Value Ref Range Status   Influenza A ([facility]) NEGATIVE NEGATIVE Final   Influenza B ([facility]) NEGATIVE NEGATIVE Final    Coagulation Studies: No results for input(s): LABPROT, INR in the last 72 hours.  Imaging: No results found.  Medications:  I have reviewed the patient's current medications. Scheduled: . baclofen  20 mg Oral QID  . enoxaparin (LOVENOX) injection  40 mg Subcutaneous Q24H  . multivitamin with minerals  1 tablet Oral Daily  . oxybutynin  5 mg Oral TID    Assessment/Plan: Patient with good improvement after 3 doses Solumedrol.  No further steroid treatment indicated at this time.    Recommendations:  1.  Continue home medications.  No steroid taper indicated. 2.  Follow up with neurology as an outpatient.     LOS: 3 days   Thana Farr, MD Neurology [phone] 07/17/2015  11:07 AM

## 2015-07-17 NOTE — Discharge Summary (Addendum)
[facility] [place] at [facility]   PATIENT NAME: Lisa Williams    MR#:  [id number]  DATE OF BIRTH:  February 12, 1961  DATE OF ADMISSION:  07/14/2015 ADMITTING PHYSICIAN: Enid Baas, MD  DATE OF DISCHARGE: 07/17/2015  PRIMARY CARE PHYSICIAN: Lauro Regulus., MD    ADMISSION DIAGNOSIS:  Multiple sclerosis (HCC) [G35]  DISCHARGE DIAGNOSIS:  Active Problems:   Multiple sclerosis exacerbation (HCC)   SECONDARY DIAGNOSIS:   Past Medical History  Diagnosis Date  . MS (multiple sclerosis) (HCC)   . HPV test positive   . Back pain     HOSPITAL COURSE:   #1 MS exacerbation- appreciate neurology consult neuro checks, IV solumedrol- for 3 days - PT consult appreciated, may need HHA on d/c - cont her baclofen for muscle spasms - started walking with a walker and felt fine. - she has appointment with neuro clinic in next 2 days.  #2 Neurogenic bladder- follows with Urology as outpatient. Cont home meds  #3 Chronic back pain- pain meds as needed  #4 DVT Prophylaxis- lovenox  #5 Tobacco use disorder- counseled against smoking for almost 3 min. Feels ready to quit.   DISCHARGE CONDITIONS:   Stable.  CONSULTS OBTAINED:  Treatment Team:  Kym Groom, MD Vernie Murders, MD  DRUG ALLERGIES:   Allergies  Allergen Reactions  . Povidone Iodine Rash    DISCHARGE MEDICATIONS:   Current Discharge Medication List    START taking these medications   Details  amLODipine (NORVASC) 5 MG tablet Take 1 tablet (5 mg total) by mouth daily. Qty: 30 tablet, Refills: 0      CONTINUE these medications which have NOT CHANGED   Details  baclofen (LIORESAL) 20 MG tablet Take 20 mg by mouth 4 (four) times daily.     Multiple Vitamins-Minerals (MULTIVITAMIN WITH MINERALS) tablet Take 1 tablet by mouth daily.     oxybutynin (DITROPAN) 5 MG tablet Take 5 mg by mouth 3 (three) times daily.    tiZANidine (ZANAFLEX) 4 MG tablet Take 4 mg by  mouth every 6 (six) hours as needed for muscle spasms.          DISCHARGE INSTRUCTIONS:    Follow with neurology in 2-3 days.  If you experience worsening of your admission symptoms, develop shortness of breath, life threatening emergency, suicidal or homicidal thoughts you must seek medical attention immediately by calling 911 or calling your MD immediately  if symptoms less severe.  You Must read complete instructions/literature along with all the possible adverse reactions/side effects for all the Medicines you take and that have been prescribed to you. Take any new Medicines after you have completely understood and accept all the possible adverse reactions/side effects.   Please note  You were cared for by a hospitalist during your hospital stay. If you have any questions about your discharge medications or the care you received while you were in the hospital after you are discharged, you can call the unit and asked to speak with the hospitalist on call if the hospitalist that took care of you is not available. Once you are discharged, your primary care physician will handle any further medical issues. Please note that NO REFILLS for any discharge medications will be authorized once you are discharged, as it is imperative that you return to your primary care physician (or establish a relationship with a primary care physician if you do not have one) for your aftercare needs so that they can reassess your need for medications  and monitor your lab values.    Today   CHIEF COMPLAINT:   Chief Complaint  Patient presents with  . Weakness  . Nausea  . Eye Pain    HISTORY OF PRESENT ILLNESS:  Lisa Williams  is a 55 y.o. female with a known history of multiple sclerosis for 21 years, following with outpatient neurologist and not in any chronic therapy due to side effects presents to the hospital secondary to worsening right leg weakness and generalized muscle weakness. Patient  states her MS flares usually involve the right leg and it presents as weakness. Denies any sensory changes. Also associated with that she has a significant headache and also nausea today. Denies any blurred vision, no chest pain abdominal pain or any other symptoms. She ambulates with a walker at baseline and also has a wheelchair but is independent and drives when she doesn't have the flares. Her symptoms started this morning. Seen by neurologist in the emergency room who recommended admission for IV steroids.   VITAL SIGNS:  Blood pressure 164/73, pulse 75, temperature 98.3 F (36.8 C), temperature source Oral, resp. rate 16, height 5\' 11"  (1.803 m), weight 96.616 kg (213 lb), SpO2 100 %.  I/O:    Intake/Output Summary (Last 24 hours) at 07/17/15 1523 Last data filed at 07/17/15 1335  Gross per 24 hour  Intake    600 ml  Output      0 ml  Net    600 ml    PHYSICAL EXAMINATION:  GENERAL: 55 y.o.-year-old patient lying in the bed with no acute distress.  EYES: Pupils equal, round, reactive to light and accommodation. No scleral icterus. Extraocular muscles intact.  HEENT: Head atraumatic, normocephalic. Oropharynx and nasopharynx clear.  NECK: Supple, no jugular venous distention. No thyroid enlargement, no tenderness.  LUNGS: Normal breath sounds bilaterally, no wheezing, rales,rhonchi or crepitation. No use of accessory muscles of respiration.  CARDIOVASCULAR: S1, S2 normal. No murmurs, rubs, or gallops.  ABDOMEN: Soft, nontender, nondistended. Bowel sounds present. No organomegaly or mass.  EXTREMITIES: No pedal edema, cyanosis, or clubbing.  NEUROLOGIC: Cranial nerves II through XII are intact. Muscle strength 5/5 in both upper extremities, 3 /5 in right lower and 4/5 in left lower. Sensation intact. Gait not checked.  PSYCHIATRIC: The patient is alert and oriented x 3.  SKIN: No obvious rash, lesion, or ulcer.   DATA REVIEW:   CBC  Recent Labs Lab 07/17/15 1042   WBC 14.5*  HGB 12.3  HCT 36.0  PLT 158    Chemistries   Recent Labs Lab 07/14/15 1046 07/17/15 0758  NA 139  --   K 3.7  --   CL 107  --   CO2 23  --   GLUCOSE 97  --   BUN 16  --   CREATININE 0.55 0.65  CALCIUM 9.1  --   AST 20  --   ALT 18  --   ALKPHOS 122  --   BILITOT 0.6  --     Cardiac Enzymes  Recent Labs Lab 07/14/15 1046  TROPONINI <0.03    Microbiology Results  Results for orders placed or performed during the hospital encounter of 07/14/15  Rapid Influenza A&B Antigens ([facility] only)     Status: None   Collection Time: 07/14/15 10:48 AM  Result Value Ref Range Status   Influenza A ([facility]) NEGATIVE NEGATIVE Final   Influenza B ([facility]) NEGATIVE NEGATIVE Final    RADIOLOGY:  No results found.  EKG:  Orders placed or performed during the hospital encounter of 07/14/15  . ED EKG  . ED EKG  . EKG 12-Lead  . EKG 12-Lead      Management plans discussed with the patient, family and they are in agreement.  CODE STATUS:     Code Status Orders        Start     Ordered   07/14/15 1605  Full code   Continuous     07/14/15 1605    Code Status History    Date Active Date Inactive Code Status Order ID Comments User Context   This patient has a current code status but no historical code status.    Advance Directive Documentation        Most Recent Value   Type of Advance Directive  Living will   Pre-existing out of facility DNR order (yellow form or pink MOST form)     "MOST" Form in Place?        TOTAL TIME TAKING CARE OF THIS PATIENT: 35 minutes.    Altamese Dilling M.D on 07/17/2015 at 3:23 PM  Between 7am to 6pm - Pager - [phone]  After 6pm go to [email or web address] - password [email or web address]  [facility]  Office  [phone]  CC: Primary care physician; Lauro Regulus., MD   Note: This dictation was prepared with Dragon dictation along with smaller phrase technology. Any transcriptional errors that  result from this process are unintentional.

## 2015-07-17 NOTE — Progress Notes (Signed)
Spoke with Dr.Vachhani for elevated blood pressure 177/75 post given IV Hydralazine 10 mg. Order received to give Norvasc 5 mg oral x 1.

## 2015-07-19 DIAGNOSIS — G8929 Other chronic pain: Secondary | ICD-10-CM

## 2015-07-19 DIAGNOSIS — M25512 Pain in left shoulder: Secondary | ICD-10-CM

## 2015-07-19 HISTORY — DX: Other chronic pain: G89.29

## 2015-07-19 HISTORY — DX: Pain in left shoulder: M25.512

## 2015-10-02 ENCOUNTER — Encounter: Payer: Self-pay | Admitting: Emergency Medicine

## 2015-10-02 ENCOUNTER — Inpatient Hospital Stay
Admission: EM | Admit: 2015-10-02 | Discharge: 2015-10-05 | DRG: 060 | Disposition: A | Discharge status: Home / Self Care | Payer: Medicare Other | Attending: Internal Medicine | Admitting: Internal Medicine

## 2015-10-02 ENCOUNTER — Emergency Department: Payer: Medicare Other

## 2015-10-02 DIAGNOSIS — Z87891 Personal history of nicotine dependence: Secondary | ICD-10-CM

## 2015-10-02 DIAGNOSIS — G35 Multiple sclerosis: Secondary | ICD-10-CM | POA: Diagnosis present

## 2015-10-02 DIAGNOSIS — Z8041 Family history of malignant neoplasm of ovary: Secondary | ICD-10-CM | POA: Diagnosis not present

## 2015-10-02 DIAGNOSIS — Z9889 Other specified postprocedural states: Secondary | ICD-10-CM

## 2015-10-02 DIAGNOSIS — J069 Acute upper respiratory infection, unspecified: Secondary | ICD-10-CM

## 2015-10-02 DIAGNOSIS — Z888 Allergy status to other drugs, medicaments and biological substances status: Secondary | ICD-10-CM

## 2015-10-02 DIAGNOSIS — I10 Essential (primary) hypertension: Secondary | ICD-10-CM | POA: Diagnosis present

## 2015-10-02 DIAGNOSIS — Z8249 Family history of ischemic heart disease and other diseases of the circulatory system: Secondary | ICD-10-CM

## 2015-10-02 DIAGNOSIS — R600 Localized edema: Secondary | ICD-10-CM | POA: Diagnosis present

## 2015-10-02 DIAGNOSIS — Z833 Family history of diabetes mellitus: Secondary | ICD-10-CM

## 2015-10-02 DIAGNOSIS — Z90710 Acquired absence of both cervix and uterus: Secondary | ICD-10-CM

## 2015-10-02 DIAGNOSIS — Z79899 Other long term (current) drug therapy: Secondary | ICD-10-CM | POA: Diagnosis not present

## 2015-10-02 DIAGNOSIS — Z9851 Tubal ligation status: Secondary | ICD-10-CM

## 2015-10-02 DIAGNOSIS — Z800 Family history of malignant neoplasm of digestive organs: Secondary | ICD-10-CM | POA: Diagnosis not present

## 2015-10-02 DIAGNOSIS — Z803 Family history of malignant neoplasm of breast: Secondary | ICD-10-CM

## 2015-10-02 LAB — BASIC METABOLIC PANEL
Anion gap: 5 (ref 5–15)
BUN: 19 mg/dL (ref 6–20)
CO2: 27 mmol/L (ref 22–32)
Calcium: 9.2 mg/dL (ref 8.9–10.3)
Chloride: 107 mmol/L (ref 101–111)
Creatinine, Ser: 0.56 mg/dL (ref 0.44–1.00)
GFR calc Af Amer: >60 mL/min (ref >60)
GFR calc non Af Amer: >60 mL/min (ref >60)
Glucose, Bld: 96 mg/dL (ref 65–99)
Potassium: 4 mmol/L (ref 3.5–5.1)
Sodium: 139 mmol/L (ref 135–145)

## 2015-10-02 LAB — TSH: TSH: 0.978 u[IU]/mL (ref 0.350–4.500)

## 2015-10-02 LAB — URINALYSIS COMPLETE WITH MICROSCOPIC (ARMC ONLY)
Bacteria, UA: NONE SEEN
Bilirubin Urine: NEGATIVE
Glucose, UA: NEGATIVE mg/dL
Leukocytes, UA: NEGATIVE
Nitrite: NEGATIVE
Protein, ur: NEGATIVE mg/dL
Specific Gravity, Urine: 1.017 (ref 1.005–1.030)
Squamous Epithelial / LPF: NONE SEEN
pH: 6 (ref 5.0–8.0)

## 2015-10-02 LAB — CBC WITH DIFFERENTIAL/PLATELET
BASOS ABS: 0.1 10*3/uL (ref 0–0.1)
Basophils Relative: 1 %
Eosinophils Absolute: 0.1 10*3/uL (ref 0–0.7)
HEMATOCRIT: 37.6 % (ref 35.0–47.0)
Hemoglobin: 12.6 g/dL (ref 12.0–16.0)
LYMPHS ABS: 1.2 10*3/uL (ref 1.0–3.6)
MCH: 26.3 pg (ref 26.0–34.0)
MCHC: 33.5 g/dL (ref 32.0–36.0)
MCV: 78.6 fL — ABNORMAL LOW (ref 80.0–100.0)
MONO ABS: 0.9 10*3/uL (ref 0.2–0.9)
NEUTROS ABS: 9.1 10*3/uL — AB (ref 1.4–6.5)
Neutrophils Relative %: 79 %
Platelets: 198 10*3/uL (ref 150–440)
RBC: 4.79 MIL/uL (ref 3.80–5.20)
RDW: 14 % (ref 11.5–14.5)
WBC: 11.4 10*3/uL — ABNORMAL HIGH (ref 3.6–11.0)

## 2015-10-02 MED ORDER — ACETAMINOPHEN 650 MG RE SUPP: 650.0000 mg | Freq: Four times a day (QID) | RECTAL | Status: DC | PRN

## 2015-10-02 MED ORDER — SENNOSIDES-DOCUSATE SODIUM 8.6-50 MG PO TABS
1.0000 | ORAL_TABLET | Freq: Every evening | ORAL | Status: DC | PRN
  Filled 2015-10-02: qty 1

## 2015-10-02 MED ORDER — ADULT MULTIVITAMIN W/MINERALS CH
1.0000 | ORAL_TABLET | Freq: Every day | ORAL | Status: DC
  Administered 2015-10-03 – 2015-10-05 (×3): 1 via ORAL
  Filled 2015-10-02 (×7): qty 1

## 2015-10-02 MED ORDER — BACLOFEN 10 MG PO TABS
20.0000 mg | ORAL_TABLET | Freq: Four times a day (QID) | ORAL | Status: DC
  Administered 2015-10-02 – 2015-10-05 (×10): 20 mg via ORAL
  Filled 2015-10-02 (×10): qty 2

## 2015-10-02 MED ORDER — SODIUM CHLORIDE 0.9 % IV SOLN
1000.0000 mg | Freq: Once | INTRAVENOUS | Status: AC
  Administered 2015-10-02: 22:00:00 1000 mg via INTRAVENOUS
  Filled 2015-10-02: qty 8

## 2015-10-02 MED ORDER — HYDROCODONE-ACETAMINOPHEN 5-325 MG PO TABS: 1.0000 | ORAL_TABLET | ORAL | Status: DC | PRN

## 2015-10-02 MED ORDER — ONDANSETRON HCL 4 MG/2ML IJ SOLN: 4.0000 mg | Freq: Four times a day (QID) | INTRAMUSCULAR | Status: DC | PRN

## 2015-10-02 MED ORDER — ONDANSETRON HCL 4 MG PO TABS: 4.0000 mg | ORAL_TABLET | Freq: Four times a day (QID) | ORAL | Status: DC | PRN

## 2015-10-02 MED ORDER — SODIUM CHLORIDE 0.9 % IV SOLN
1000.0000 mg | Freq: Every day | INTRAVENOUS | Status: DC
  Administered 2015-10-03 – 2015-10-05 (×3): 1000 mg via INTRAVENOUS
  Filled 2015-10-02 (×4): qty 8

## 2015-10-02 MED ORDER — ENOXAPARIN SODIUM 40 MG/0.4ML ~~LOC~~ SOLN
40.0000 mg | SUBCUTANEOUS | Status: DC
  Administered 2015-10-02 – 2015-10-04 (×3): 40 mg via SUBCUTANEOUS
  Filled 2015-10-02 (×3): qty 0.4

## 2015-10-02 MED ORDER — TIZANIDINE HCL 4 MG PO TABS: 4.0000 mg | ORAL_TABLET | Freq: Four times a day (QID) | ORAL | Status: DC | PRN

## 2015-10-02 MED ORDER — OXYBUTYNIN CHLORIDE 5 MG PO TABS
5.0000 mg | ORAL_TABLET | Freq: Two times a day (BID) | ORAL | Status: DC
  Administered 2015-10-02 – 2015-10-05 (×6): 5 mg via ORAL
  Filled 2015-10-02 (×6): qty 1

## 2015-10-02 MED ORDER — ACETAMINOPHEN 325 MG PO TABS: 650.0000 mg | ORAL_TABLET | Freq: Four times a day (QID) | ORAL | Status: DC | PRN

## 2015-10-02 MED ORDER — AMLODIPINE BESYLATE 5 MG PO TABS
5.0000 mg | ORAL_TABLET | Freq: Every day | ORAL | Status: DC
  Administered 2015-10-03 – 2015-10-05 (×3): 5 mg via ORAL
  Filled 2015-10-02 (×4): qty 1

## 2015-10-02 NOTE — ED Notes (Signed)
Patient brought in by [facility] from home for c/o weakness. Patient states that she has a PMH of MS. Patient was recently sick with a cold, this morning when she woke up patient was having weakness in the legs. Patient states that this is typical of her MS flares. Patient has decreased sensation in her BLE but more so in the right leg. Patient is able to wiggle toes in both feet but less in the right foot. Patient able to lift left leg but unable to lift right leg at this time

## 2015-10-02 NOTE — ED Provider Notes (Signed)
[facility] Emergency Department Provider Note   ____________________________________________  Time seen: Seen upon arrival to the emergency department  I have reviewed the triage vital signs and the nursing notes.   HISTORY  Chief Complaint Weakness   HPI Lisa Williams is a 55 y.o. female with a history of multiple sclerosis was presenting to the emergency department with bilateral lower extremity weakness. She says that every time she gets an upper respiratory infection she has a multiple sclerosis crisis. She is seen on an ongoing basis by Dr. Thad Ranger of neurology. She says that usually 1 g of methylprednisolone is effective.  She denies any pain at this time. No fevers at home. Says that she has had a cough and runny nose over the past several days which he thinks he may call from her sister. Last admission was in March for similar issue. Says that normally she is ambulatory with a walker. Says that the weakness started yesterday and progressed today and she is no longer able to ambulate.   Past Medical History  Diagnosis Date  . MS (multiple sclerosis) (HCC)   . HPV test positive   . Back pain     Patient Active Problem List   Diagnosis Date Noted  . Multiple sclerosis exacerbation (HCC) 07/14/2015  . MS (multiple sclerosis) (HCC) 11/26/2014  . Increased body mass index 11/26/2014  . HPV test positive 11/26/2014  . Status post laparoscopic supracervical hysterectomy 11/26/2014  . Galactorrhea 11/26/2014  . Tobacco user 11/26/2014  . Back ache 05/21/2014  . Adiposity 05/21/2014  . Disordered sleep 05/21/2014  . Muscle spasticity 05/21/2014  . Calculus of kidney 12/09/2013  . Renal colic 12/09/2013  . Hypercholesteremia 08/19/2013  . Hereditary and idiopathic neuropathy 08/19/2013  . Hypercholesterolemia without hypertriglyceridemia 08/19/2013  . Bladder infection, chronic 07/25/2012  . Disorder of bladder function 07/25/2012  .  Incomplete bladder emptying 07/25/2012    Past Surgical History  Procedure Laterality Date  . Laparoscopic supracervical hysterectomy  08/05/2013  . Bilateral tubal ligation  1996  . Knee surgery      right    Current Outpatient Rx  Name  Route  Sig  Dispense  Refill  . amLODipine (NORVASC) 2.5 MG tablet   Oral   Take 2.5 mg by mouth daily.      11   . baclofen (LIORESAL) 20 MG tablet   Oral   Take 20 mg by mouth 4 (four) times daily.          . Multiple Vitamins-Minerals (MULTIVITAMIN WITH MINERALS) tablet   Oral   Take 1 tablet by mouth daily.          Marland Kitchen oxybutynin (DITROPAN) 5 MG tablet   Oral   Take 5 mg by mouth 2 (two) times daily.          Marland Kitchen tiZANidine (ZANAFLEX) 4 MG tablet   Oral   Take 4 mg by mouth every 6 (six) hours as needed for muscle spasms.          Marland Kitchen amLODipine (NORVASC) 5 MG tablet   Oral   Take 1 tablet (5 mg total) by mouth daily.   30 tablet   0     Allergies Povidone iodine  Family History  Problem Relation Age of Onset  . Sickle cell trait Sister   . Supraventricular tachycardia Sister   . Diabetes Maternal Grandmother   . Diabetes Maternal Aunt   . Breast cancer Paternal Grandmother   . Liver  cancer Maternal Grandmother   . Hypertension Father   . Hypertension Sister   . Ovarian cancer Paternal Grandmother     Social History Social History  Substance Use Topics  . Smoking status: Former Smoker -- 0.50 packs/day    Types: Cigarettes  . Smokeless tobacco: Never Used  . Alcohol Use: No    Review of Systems Constitutional: No fever/chills Eyes: No visual changes. ENT: No sore throat. Cardiovascular: Denies chest pain. Respiratory: Denies shortness of breath. Gastrointestinal: No abdominal pain.  No nausea, no vomiting.  No diarrhea.  No constipation. Genitourinary: Negative for dysuria. Musculoskeletal: Negative for back pain. Skin: Negative for rash. Neurological: Negative for headaches or numbness.  10-point  ROS otherwise negative.  ____________________________________________   PHYSICAL EXAM:  VITAL SIGNS: ED Triage Vitals  Enc Vitals Group     BP 10/02/15 1534 150/71 mmHg     Pulse Rate 10/02/15 1534 98     Resp 10/02/15 1534 18     Temp 10/02/15 1534 98.9 F (37.2 C)     Temp Source 10/02/15 1534 Oral     SpO2 10/02/15 1534 100 %     Weight 10/02/15 1534 213 lb (96.616 kg)     Height 10/02/15 1534  (1.803 m)     Head Cir --      Peak Flow --      Pain Score 10/02/15 1538 0     Pain Loc --      Pain Edu? --      Excl. in GC? --     Constitutional: Alert and oriented. Well appearing and in no acute distress. Eyes: Conjunctivae are normal. PERRL. EOMI. Head: Atraumatic. Nose: No congestion/rhinnorhea. Mouth/Throat: Mucous membranes are moist.  Oropharynx non-erythematous. Neck: No stridor.   Cardiovascular: Normal rate, regular rhythm. Grossly normal heart sounds.  Good peripheral circulation With bilaterally equal dorsalis pedis pulses. Respiratory: Normal respiratory effort.  No retractions. Lungs CTAB. Gastrointestinal: Soft and nontender. No distention. No abdominal bruits. No CVA tenderness. Musculoskeletal: No lower extremity tenderness nor edema.  No joint effusions. Neurologic:  Normal speech and language. Lower extremities with 2 out of 10 in strength to the left lower extremity and without any movement to the right lower extremity. The patient has sensation to light-touch to the bilateral lower extremity is. She is wearing braces to the bilateral lower extremities.  She says this pattern of weakness is typical for her with her multiple sclerosis flares. Skin:  Skin is warm, dry and intact. No rash noted. Psychiatric: Mood and affect are normal. Speech and behavior are normal.  ____________________________________________   LABS (all labs ordered are listed, but only abnormal results are displayed)  Labs Reviewed  CBC WITH DIFFERENTIAL/PLATELET - Abnormal;  Notable for the following:    WBC 11.4 (*)    MCV 78.6 (*)    Neutro Abs 9.1 (*)    All other components within normal limits  URINALYSIS COMPLETEWITH MICROSCOPIC ([facility] ONLY) - Abnormal; Notable for the following:    Color, Urine YELLOW (*)    APPearance CLEAR (*)    Ketones, ur TRACE (*)    Hgb urine dipstick 1+ (*)    All other components within normal limits  BASIC METABOLIC PANEL   ____________________________________________  EKG   ____________________________________________  RADIOLOGY  DG Chest 2 View (Final result) Result time: 10/02/15 16:24:53   Final result by Rad Results In Interface (10/02/15 16:24:53)   Narrative:   CLINICAL DATA: Multiple sclerosis, cold symptoms for 2 days  EXAM: CHEST 2 VIEW  COMPARISON: 07/14/2015  FINDINGS: The heart size and mediastinal contours are within normal limits. Both lungs are clear. The visualized skeletal structures are unremarkable.  IMPRESSION: No active cardiopulmonary disease.   Electronically Signed By: Elige Ko On: 10/02/2015 16:24    ____________________________________________   PROCEDURES   ____________________________________________   INITIAL IMPRESSION / ASSESSMENT AND PLAN / ED COURSE  Pertinent labs & imaging results that were available during my care of the patient were reviewed by me and considered in my medical decision making (see chart for details).  ----------------------------------------- 5:18 PM on 10/02/2015 -----------------------------------------  Discussed case with Dr. Loretha Brasil who recommends checking a urinalysis prior to steroid administration. Urinalysis appears to be noninfected and there is no pneumonia found on the chest x-ray. We'll proceed with IV steroids at 1 g of Solu-Medrol. We'll admit to the hospital. Discussed the plan with Dr. Tildon Husky as well as the patient and her family are understanding and willing to  comply. ____________________________________________   FINAL CLINICAL IMPRESSION(S) / ED DIAGNOSES  URI. MS flare.    NEW MEDICATIONS STARTED DURING THIS VISIT:  New Prescriptions   No medications on file     Note:  This document was prepared using Dragon voice recognition software and may include unintentional dictation errors.    Myrna Blazer, MD 10/02/15 [phone]

## 2015-10-02 NOTE — H&P (Signed)
[facility] - [place] at [facility]   PATIENT NAME: Lisa Williams    MR#:  [id number]  DATE OF BIRTH:  Nov 18, 1960  DATE OF ADMISSION:  10/02/2015  PRIMARY CARE PHYSICIAN: Lauro Regulus., MD   REQUESTING/REFERRING PHYSICIAN: Dr Langston Masker  CHIEF COMPLAINT:   MS flare HISTORY OF PRESENT ILLNESS:  Lisa Williams  is a 55 y.o. female with a known history of MS who presents with eye lateral lower extremity weakness. She reports that she is having symptoms of MS flare. She reports that when she has a cold she has a flare of MS. She has recently suffered a viral upper respiratory infection. Neurology was called while patient was in the emergency room who recommended 1 g of IV Solu-Medrol daily for 3-5 days.  PAST MEDICAL HISTORY:   Past Medical History  Diagnosis Date  . MS (multiple sclerosis) (HCC)   . HPV test positive   . Back pain     PAST SURGICAL HISTORY:   Past Surgical History  Procedure Laterality Date  . Laparoscopic supracervical hysterectomy  08/05/2013  . Bilateral tubal ligation  1996  . Knee surgery      right    SOCIAL HISTORY:   Social History  Substance Use Topics  . Smoking status: Former Smoker -- 0.50 packs/day    Types: Cigarettes  . Smokeless tobacco: Never Used  . Alcohol Use: No    FAMILY HISTORY:   Family History  Problem Relation Age of Onset  . Sickle cell trait Sister   . Supraventricular tachycardia Sister   . Diabetes Maternal Grandmother   . Diabetes Maternal Aunt   . Breast cancer Paternal Grandmother   . Liver cancer Maternal Grandmother   . Hypertension Father   . Hypertension Sister   . Ovarian cancer Paternal Grandmother     DRUG ALLERGIES:   Allergies  Allergen Reactions  . Povidone Iodine Rash    REVIEW OF SYSTEMS:   Review of Systems  Constitutional: Negative for fever, chills and malaise/fatigue.  HENT: Negative for ear discharge, ear pain, hearing loss, nosebleeds and sore throat.    Eyes: Negative for blurred vision and pain.  Respiratory: Negative for cough, hemoptysis, shortness of breath and wheezing.   Cardiovascular: Positive for leg swelling. Negative for chest pain and palpitations.  Gastrointestinal: Negative for nausea, vomiting, abdominal pain, diarrhea and blood in stool.  Genitourinary: Negative for dysuria.  Musculoskeletal: Negative for back pain.  Neurological: Positive for focal weakness (Bilateral lower extremity weakness from MS flare) and weakness. Negative for dizziness, tremors, speech change, seizures and headaches.  Endo/Heme/Allergies: Does not bruise/bleed easily.  Psychiatric/Behavioral: Negative for depression, suicidal ideas and hallucinations.    MEDICATIONS AT HOME:   Prior to Admission medications   Medication Sig Start Date End Date Taking? Authorizing Provider  amLODipine (NORVASC) 2.5 MG tablet Take 2.5 mg by mouth daily. 07/26/15  Yes Historical Provider, MD  baclofen (LIORESAL) 20 MG tablet Take 20 mg by mouth 4 (four) times daily.    Yes Historical Provider, MD  Multiple Vitamins-Minerals (MULTIVITAMIN WITH MINERALS) tablet Take 1 tablet by mouth daily.    Yes Historical Provider, MD  oxybutynin (DITROPAN) 5 MG tablet Take 5 mg by mouth 2 (two) times daily.    Yes Historical Provider, MD  tiZANidine (ZANAFLEX) 4 MG tablet Take 4 mg by mouth every 6 (six) hours as needed for muscle spasms.    Yes Historical Provider, MD  amLODipine (NORVASC) 5 MG tablet Take 1 tablet (5 mg  total) by mouth daily. 07/17/15   Altamese Dilling, MD      VITAL SIGNS:  Blood pressure 159/83, pulse 99, temperature 98.9 F (37.2 C), temperature source Oral, resp. rate 18, height  (1.803 m), weight 96.616 kg (213 lb), SpO2 100 %.  PHYSICAL EXAMINATION:   Physical Exam  Constitutional: She is oriented to person, place, and time and well-developed, well-nourished, and in no distress. No distress.  HENT:  Head: Normocephalic.  Eyes: No scleral  icterus.  Neck: Normal range of motion. Neck supple. No JVD present. No tracheal deviation present.  Cardiovascular: Normal rate, regular rhythm and normal heart sounds.  Exam reveals no gallop and no friction rub.   No murmur heard. Pulmonary/Chest: Effort normal and breath sounds normal. No respiratory distress. She has no wheezes. She has no rales. She exhibits no tenderness.  Abdominal: Soft. Bowel sounds are normal. She exhibits no distension and no mass. There is no tenderness. There is no rebound and no guarding.  Musculoskeletal: Normal range of motion. She exhibits edema.  Bilateral lower extremity weakness  Neurological: She is alert and oriented to person, place, and time.  Skin: Skin is warm. No rash noted. No erythema.  Psychiatric: Affect and judgment normal.      LABORATORY PANEL:   CBC  Recent Labs Lab 10/02/15 1534  WBC 11.4*  HGB 12.6  HCT 37.6  PLT 198   ------------------------------------------------------------------------------------------------------------------  Chemistries   Recent Labs Lab 10/02/15 1534  NA 139  K 4.0  CL 107  CO2 27  GLUCOSE 96  BUN 19  CREATININE 0.56  CALCIUM 9.2   ------------------------------------------------------------------------------------------------------------------  Cardiac Enzymes No results for input(s): TROPONINI in the last 168 hours. ------------------------------------------------------------------------------------------------------------------  RADIOLOGY:  Dg Chest 2 View  10/02/2015  CLINICAL DATA:  Multiple sclerosis, cold symptoms for 2 days EXAM: CHEST  2 VIEW COMPARISON:  07/14/2015 FINDINGS: The heart size and mediastinal contours are within normal limits. Both lungs are clear. The visualized skeletal structures are unremarkable. IMPRESSION: No active cardiopulmonary disease. Electronically Signed   By: Elige Ko   On: 10/02/2015 16:24    EKG:    IMPRESSION AND PLAN:   55 year old  female with a history of multiple sclerosis who presents with MS flare.  1. MS flare with bilateral lower extremity weakness: Continue Solu-Medrol 1 g IV every day for 3-5 days. Consult neurology. Neuro checks every 4 hours.  2. Lower extremity edema: Patient reports this is at her baseline. It does not appear that she has had an echo in the past. Order echo and TSH.  3. Essential hypertension: Continue Norvasc.  All the records are reviewed and case discussed with ED provider. Management plans discussed with the patient and she in agreement  CODE STATUS: FULL  TOTAL TIME TAKING CARE OF THIS PATIENT: 45 minutes.    Darrelle Barrell M.D on 10/02/2015 at 5:29 PM  Between 7am to 6pm - Pager - [phone]  After 6pm go to [email or web address] - password [email or web address]  [facility]  Office  [phone]  CC: Primary care physician; Lauro Regulus., MD

## 2015-10-03 ENCOUNTER — Inpatient Hospital Stay
Admit: 2015-10-03 | Discharge: 2015-10-03 | Disposition: A | Discharge status: Home / Self Care | Payer: Medicare Other | Attending: Internal Medicine | Admitting: Internal Medicine

## 2015-10-03 DIAGNOSIS — G35 Multiple sclerosis: Principal | ICD-10-CM

## 2015-10-03 LAB — CBC
HEMATOCRIT: 37.4 % (ref 35.0–47.0)
Hemoglobin: 12.9 g/dL (ref 12.0–16.0)
MCH: 27.1 pg (ref 26.0–34.0)
MCHC: 34.4 g/dL (ref 32.0–36.0)
MCV: 78.6 fL — AB (ref 80.0–100.0)
PLATELETS: 191 10*3/uL (ref 150–440)
RBC: 4.75 MIL/uL (ref 3.80–5.20)
RDW: 14.2 % (ref 11.5–14.5)
WBC: 7.7 10*3/uL (ref 3.6–11.0)

## 2015-10-03 LAB — ECHOCARDIOGRAM COMPLETE
Height: 71 in
WEIGHTICAEL: 3555.2 [oz_av]

## 2015-10-03 NOTE — Consult Note (Signed)
CC: Lower extremity weakness   HPI: Lisa Williams is an 55 y.o. female female with a known history of MS who presents with bilateral lower extremity weakness. This is similar presentation to one few months back. She reports that she is having symptoms of MS flare. She reports that when she has a cold she has a flare of MS. She has recently suffered a viral upper respiratory infection.   Past Medical History  Diagnosis Date  . MS (multiple sclerosis) (HCC)   . HPV test positive   . Back pain     Past Surgical History  Procedure Laterality Date  . Laparoscopic supracervical hysterectomy  08/05/2013  . Bilateral tubal ligation  1996  . Knee surgery      right    Family History  Problem Relation Age of Onset  . Sickle cell trait Sister   . Supraventricular tachycardia Sister   . Diabetes Maternal Grandmother   . Diabetes Maternal Aunt   . Breast cancer Paternal Grandmother   . Liver cancer Maternal Grandmother   . Hypertension Father   . Hypertension Sister   . Ovarian cancer Paternal Grandmother     Social History:  reports that she has quit smoking. Her smoking use included Cigarettes. She smoked 0.50 packs per day. She has never used smokeless tobacco. She reports that she does not drink alcohol or use illicit drugs.  Allergies  Allergen Reactions  . Povidone Iodine Rash    Medications: I have reviewed the patient's current medications.  ROS: History obtained from the patient  General ROS: negative for - chills, fatigue, fever, night sweats, weight gain or weight loss Psychological ROS: negative for - behavioral disorder, hallucinations, memory difficulties, mood swings or suicidal ideation Ophthalmic ROS: negative for - blurry vision, double vision, eye pain or loss of vision ENT ROS: negative for - epistaxis, nasal discharge, oral lesions, sore throat, tinnitus or vertigo Allergy and Immunology ROS: negative for - hives or itchy/watery  eyes Hematological and Lymphatic ROS: negative for - bleeding problems, bruising or swollen lymph nodes Endocrine ROS: negative for - galactorrhea, hair pattern changes, polydipsia/polyuria or temperature intolerance Respiratory ROS: negative for - cough, hemoptysis, shortness of breath or wheezing Cardiovascular ROS: negative for - chest pain, dyspnea on exertion, edema or irregular heartbeat Gastrointestinal ROS: negative for - abdominal pain, diarrhea, hematemesis, nausea/vomiting or stool incontinence Genito-Urinary ROS: negative for - dysuria, hematuria, incontinence or urinary frequency/urgency Musculoskeletal ROS: negative for - joint swelling or muscular weakness Neurological ROS: as noted in HPI Dermatological ROS: negative for rash and skin lesion changes  Physical Examination: Blood pressure 130/68, pulse 84, temperature 98.1 F (36.7 C), temperature source Oral, resp. rate 18, height 5\' 11"  (1.803 m), weight 100.789 kg (222 lb 3.2 oz), SpO2 95 %.  H Neurological Examination Mental Status: Alert, oriented, thought content appropriate.  Speech fluent without evidence of aphasia.  Able to follow 3 step commands without difficulty. Cranial Nerves: II: Discs flat bilaterally; Visual fields grossly normal, pupils equal, round, reactive to light and accommodation III,IV, VI: ptosis not present, extra-ocular motions intact bilaterally V,VII: smile symmetric, facial light touch sensation normal bilaterally VIII: hearing normal bilaterally IX,X: gag reflex present XI: bilateral shoulder shrug XII: midline tongue extension Motor: Right : Upper extremity   4+/5    Left:     Upper extremity   4+/5  Lower extremity   4/5     Lower extremity   4/5 Tone and bulk:normal tone throughout; no atrophy noted  Sensory: Pinprick and light touch intact throughout, bilaterally Deep Tendon Reflexes: 2+ and symmetric throughout Plantars: Right: downgoing   Left: downgoing Cerebellar: normal  finger-to-nose, normal rapid alternating movements and normal heel-to-shin test Gait: not examined     Laboratory Studies:   Basic Metabolic Panel:  Recent Labs Lab 10/02/15 1534  NA 139  K 4.0  CL 107  CO2 27  GLUCOSE 96  BUN 19  CREATININE 0.56  CALCIUM 9.2    Liver Function Tests: No results for input(s): AST, ALT, ALKPHOS, BILITOT, PROT, ALBUMIN in the last 168 hours. No results for input(s): LIPASE, AMYLASE in the last 168 hours. No results for input(s): AMMONIA in the last 168 hours.  CBC:  Recent Labs Lab 10/02/15 1534 10/03/15 0429  WBC 11.4* 7.7  NEUTROABS 9.1*  --   HGB 12.6 12.9  HCT 37.6 37.4  MCV 78.6* 78.6*  PLT 198 191    Cardiac Enzymes: No results for input(s): CKTOTAL, CKMB, CKMBINDEX, TROPONINI in the last 168 hours.  BNP: Invalid input(s): POCBNP  CBG: No results for input(s): GLUCAP in the last 168 hours.  Microbiology: Results for orders placed or performed during the hospital encounter of 07/14/15  Rapid Influenza A&B Antigens ([facility] only)     Status: None   Collection Time: 07/14/15 10:48 AM  Result Value Ref Range Status   Influenza A ([facility]) NEGATIVE NEGATIVE Final   Influenza B ([facility]) NEGATIVE NEGATIVE Final    Coagulation Studies: No results for input(s): LABPROT, INR in the last 72 hours.  Urinalysis:  Recent Labs Lab 10/02/15 1656  COLORURINE YELLOW*  LABSPEC 1.017  PHURINE 6.0  GLUCOSEU NEGATIVE  HGBUR 1+*  BILIRUBINUR NEGATIVE  KETONESUR TRACE*  PROTEINUR NEGATIVE  NITRITE NEGATIVE  LEUKOCYTESUR NEGATIVE    Lipid Panel:     Component Value Date/Time   CHOL 178 11/27/2014 0956   TRIG 74 11/27/2014 0956   HDL 51 11/27/2014 0956   CHOLHDL 3.5 11/27/2014 0956   LDLCALC 112* 11/27/2014 0956    HgbA1C:  Lab Results  Component Value Date   HGBA1C 5.2 11/27/2014    Urine Drug Screen:  No results found for: LABOPIA, COCAINSCRNUR, LABBENZ, AMPHETMU, THCU, LABBARB  Alcohol Level: No results for input(s):  ETH in the last 168 hours.  Other results: EKG: normal EKG, normal sinus rhythm, unchanged from previous tracings.  Imaging: Dg Chest 2 View  10/02/2015  CLINICAL DATA:  Multiple sclerosis, cold symptoms for 2 days EXAM: CHEST  2 VIEW COMPARISON:  07/14/2015 FINDINGS: The heart size and mediastinal contours are within normal limits. Both lungs are clear. The visualized skeletal structures are unremarkable. IMPRESSION: No active cardiopulmonary disease. Electronically Signed   By: Elige Ko   On: 10/02/2015 16:24     Assessment/Plan: 55 y.o. female female with a known history of MS who presents with bilateral lower extremity weakness. This is similar presentation to one few months back. She reports that she is having symptoms of MS flare. She reports that when she has a cold she has a flare of MS. She has recently suffered a viral upper respiratory infection. Pt has been diagnosed with MS in 2005 and currently sees Dr. [name]  She is not on any disease modifying agents. Only on baclofen for spasm.  Dr. [name] did discuss different agents which she refused due to side effects.    Currently feeling stronger. Able to stand this AM  S/p 1 gm of solumedrol Finish two more doses of solumedrol and d/c planning  tomorrow Pt/ot F/up Dr. [name] as out pt Strongly suggest disease modifying agent.    10/03/2015, 9:55 AM

## 2015-10-03 NOTE — Progress Notes (Signed)
[employer] [facility] - [place] at [facility]   PATIENT NAME: Lisa Williams    MR#:  [id number]  DATE OF BIRTH:  1961-03-08  SUBJECTIVE:  CHIEF COMPLAINT:   Chief Complaint  Patient presents with  . Weakness  Have MS flare after a viral illenss. Neurologist suggested to have IV solumedrol 1 gm daily- feels little better after the first dose yesterday.    REVIEW OF SYSTEMS:  CONSTITUTIONAL: No fever, fatigue or weakness.  EYES: No blurred or double vision.  EARS, NOSE, AND THROAT: No tinnitus or ear pain.  RESPIRATORY: No cough, shortness of breath, wheezing or hemoptysis.  CARDIOVASCULAR: No chest pain, orthopnea, edema.  GASTROINTESTINAL: No nausea, vomiting, diarrhea or abdominal pain.  GENITOURINARY: No dysuria, hematuria.  ENDOCRINE: No polyuria, nocturia,  HEMATOLOGY: No anemia, easy bruising or bleeding SKIN: No rash or lesion. MUSCULOSKELETAL: No joint pain or arthritis.   NEUROLOGIC: No tingling,Bilateral lower activity numbness, weakness.  PSYCHIATRY: No anxiety or depression.   ROS  DRUG ALLERGIES:   Allergies  Allergen Reactions  . Povidone Iodine Rash    VITALS:  Blood pressure 130/68, pulse 84, temperature 98.1 F (36.7 C), temperature source Oral, resp. rate 18, height  (1.803 m), weight 100.789 kg (222 lb 3.2 oz), SpO2 95 %.  PHYSICAL EXAMINATION:  GENERAL:  55 y.o.-year-old patient lying in the bed with no acute distress.  EYES: Pupils equal, round, reactive to light and accommodation. No scleral icterus. Extraocular muscles intact.  HEENT: Head atraumatic, normocephalic. Oropharynx and nasopharynx clear.  NECK:  Supple, no jugular venous distention. No thyroid enlargement, no tenderness.  LUNGS: Normal breath sounds bilaterally, no wheezing, rales,rhonchi or crepitation. No use of accessory muscles of respiration.  CARDIOVASCULAR: S1, S2 normal. No murmurs, rubs, or gallops.  ABDOMEN: Soft, nontender, nondistended. Bowel sounds  present. No organomegaly or mass.  EXTREMITIES: No pedal edema, cyanosis, or clubbing.  NEUROLOGIC: Cranial nerves II through XII are intact. Muscle strength 5/5 in upper extremities and 1/5 in both lower extremities. Sensation intact. Gait not checked.  PSYCHIATRIC: The patient is alert and oriented x 3.  SKIN: No obvious rash, lesion, or ulcer.   Physical Exam LABORATORY PANEL:   CBC  Recent Labs Lab 10/03/15 0429  WBC 7.7  HGB 12.9  HCT 37.4  PLT 191   ------------------------------------------------------------------------------------------------------------------  Chemistries   Recent Labs Lab 10/02/15 1534  NA 139  K 4.0  CL 107  CO2 27  GLUCOSE 96  BUN 19  CREATININE 0.56  CALCIUM 9.2   ------------------------------------------------------------------------------------------------------------------  Cardiac Enzymes No results for input(s): TROPONINI in the last 168 hours. ------------------------------------------------------------------------------------------------------------------  RADIOLOGY:  Dg Chest 2 View  10/02/2015  CLINICAL DATA:  Multiple sclerosis, cold symptoms for 2 days EXAM: CHEST  2 VIEW COMPARISON:  07/14/2015 FINDINGS: The heart size and mediastinal contours are within normal limits. Both lungs are clear. The visualized skeletal structures are unremarkable. IMPRESSION: No active cardiopulmonary disease. Electronically Signed   By: Elige Ko   On: 10/02/2015 16:24    ASSESSMENT AND PLAN:   Active Problems:   Multiple sclerosis (HCC)   1. MS flare with bilateral lower extremity weakness: Continue Solu-Medrol 1 g IV every day for 3-5 days. Consult neurology. Neuro checks every 4 hours.  2. Lower extremity edema: Patient reports this is at her baseline. It does not appear that she has had an echo in the past. Awaited results and echo cardial gram. TSH is normal.  3. Essential hypertension: Continue Norvasc.  All the records  are reviewed and case discussed with Care Management/Social Workerr. Management plans discussed with the patient, family and they are in agreement.  CODE STATUS:  Full code TOTAL TIME TAKING CARE OF THIS PATIENT: 35 minutes.     POSSIBLE D/C IN 3-4 DAYS, DEPENDING ON CLINICAL CONDITION.   Altamese Dilling M.D on 10/03/2015   Between 7am to 6pm - Pager - [phone]  After 6pm go to [email or web address] - password [employer]  [facility]  Office  [phone]  CC: Primary care physician; Lauro Regulus., MD  Note: This dictation was prepared with Dragon dictation along with smaller phrase technology. Any transcriptional errors that result from this process are unintentional.

## 2015-10-03 NOTE — Progress Notes (Signed)
*  PRELIMINARY RESULTS* Echocardiogram 2D Echocardiogram has been performed.  Georgann Housekeeper Hege 10/03/2015, 9:50 AM

## 2015-10-03 NOTE — Evaluation (Signed)
Physical Therapy Evaluation Patient Details Name: Lisa Williams MRN: [id number] DOB: 1961-02-01 Today's Date: 10/03/2015   History of Present Illness  Lisa Williams is a 55 y.o. female with a known history of MS who presents with right lateral lower extremity weakness. She reports that she is having symptoms of MS flare.   Clinical Impression  55 yo Female with MS; She was living at home with her sister and mother. She was admitted with upper respiratory infection which led to MS flare. Patient exhibits increased weakness in BLE. However she reports increased weakness at baseline so it is hard to decipher how much weakness is related to current exacerbation. Patient was mod I for self care at baseline. Currently she is mod I for bed mobility, CGA for sit<>stand transfers and ambulates with RW with supervision; She does require bilateral AFOs for gait safety; Patient would benefit from additional skilled PT intervention to improve balance/gait safety and reduce fall risk;     Follow Up Recommendations Home health PT    Equipment Recommendations  None recommended by PT    Recommendations for Other Services       Precautions / Restrictions Precautions Precautions: Fall Restrictions Weight Bearing Restrictions: No      Mobility  Bed Mobility Overal bed mobility: Modified Independent             General bed mobility comments: uses bed rail; able to move LE well with UE assistance;   Transfers Overall transfer level: Needs assistance Equipment used: Rolling walker (2 wheeled) Transfers: Sit to/from Stand Sit to Stand: Min guard         General transfer comment: Patient transfers sit<>stand with CGA, demonstrates good hand placement; requires a lot of UE strength to push into standing;   Ambulation/Gait Ambulation/Gait assistance: Supervision Ambulation Distance (Feet): 100 Feet Assistive device: Rolling walker (2 wheeled) Gait Pattern/deviations:  Step-through pattern;Decreased step length - right   Gait velocity interpretation: <1.8 ft/sec, indicative of risk for recurrent falls General Gait Details: ambulates with decreased hip flexion, and push through BUE to lift LE and swing through; Patient has decreased RLE step length due to increased hip weakness; slower gait speed; demonstrates good safety awareness with good walker position; requires B LE AFOs for gait safety;   Stairs            Wheelchair Mobility    Modified Rankin (Stroke Patients Only)       Balance Overall balance assessment: Needs assistance Sitting-balance support: Single extremity supported;Feet supported Sitting balance-Leahy Scale: Good     Standing balance support: Bilateral upper extremity supported Standing balance-Leahy Scale: Fair Standing balance comment: able to stand supervision with RW; dynamic standing balance is fair;                              Pertinent Vitals/Pain Pain Assessment: No/denies pain    Home Living Family/patient expects to be discharged to:: Private residence Living Arrangements: Parent;Other relatives (sister and mom) Available Help at Discharge: Family Type of Home: House Home Access: Stairs to enter Entrance Stairs-Rails: None Entrance Stairs-Number of Steps: 1 Home Layout: Two level;Able to live on main level with bedroom/bathroom Home Equipment: Dan Humphreys - 2 wheels;Wheelchair - manual      Prior Function Level of Independence: Independent with assistive device(s)         Comments: was driving, walking some in the community; denies any recent falls; mod I for self care ADLs;  Hand Dominance        Extremity/Trunk Assessment   Upper Extremity Assessment: Overall WFL for tasks assessed           Lower Extremity Assessment: RLE deficits/detail;LLE deficits/detail RLE Deficits / Details: hip flexion 2-/5, hip abduction/adduction 3/5, knee: flexion 2-/5, knee extension 3/5; ankle  3-/5; denies any numbness/tingling; LLE Deficits / Details: hip flexion 2-/5, hip abduction/adduction 3/5, knee: flexion 2-/5, knee extension 3/5; ankle 3-/5; denies any numbness/tingling;      Communication   Communication: No difficulties  Cognition Arousal/Alertness: Awake/alert Behavior During Therapy: WFL for tasks assessed/performed Overall Cognitive Status: Within Functional Limits for tasks assessed                      General Comments      Exercises        Assessment/Plan    PT Assessment Patient needs continued PT services  PT Diagnosis Difficulty walking;Generalized weakness   PT Problem List Decreased strength;Decreased activity tolerance;Decreased mobility  PT Treatment Interventions Gait training;Stair training;Functional mobility training;Therapeutic activities;Therapeutic exercise;Patient/family education   PT Goals (Current goals can be found in the Care Plan section) Acute Rehab PT Goals Patient Stated Goal: "To go home." PT Goal Formulation: With patient Time For Goal Achievement: 10/17/15 Potential to Achieve Goals: Good    Frequency Min 2X/week   Barriers to discharge Inaccessible home environment has small threshhold step, but able to use RW;     Co-evaluation               End of Session Equipment Utilized During Treatment: Gait belt Activity Tolerance: Patient tolerated treatment well;No increased pain Patient left: in bed;with family/visitor present           Time: 1355-1411 PT Time Calculation (min) (ACUTE ONLY): 16 min   Charges:   PT Evaluation $PT Eval Low Complexity: 1 Procedure     PT G Codes:        Trotter,Margaret PT, DPT 10/03/2015, 3:10 PM

## 2015-10-04 MED ORDER — GUAIFENESIN-DM 100-10 MG/5ML PO SYRP
5.0000 mL | ORAL_SOLUTION | ORAL | Status: DC | PRN
  Administered 2015-10-04: 5 mL via ORAL
  Filled 2015-10-04: qty 5

## 2015-10-04 NOTE — Care Management (Signed)
Admitted to [facility] with the diagnosis of multiple sclerosis. Lives with sister and mother. Mother is Claris Che [phone]). Last seen Dr. Einar Crow March 20th. Home Health per [facility] in the past. No skilled nursing. Last fall was about a year ago. Good appetite. Gets prescriptions filled at [facility] in [place]. Takes care of all basic and instrumental activities of daily living herself, drives. No home oxygen. Physical therapy evaluation completed. Recommends home with home health and physical therapy. Would like [facility] again. Will discharge from this hospital tomorrow per Dr. Elisabeth Pigeon. Will be going to sister's home at [address] [place]. Telephone# [phone] Gwenette Greet RN MSN CCM Care Management [phone]

## 2015-10-04 NOTE — Progress Notes (Signed)
[employer] - [place] at [facility]   PATIENT NAME: Lisa Williams    MR#:  [id number]  DATE OF BIRTH:  [birth year]  SUBJECTIVE:  CHIEF COMPLAINT:   Chief Complaint  Patient presents with  . Weakness  Have MS flare after a viral illenss. Neurologist suggested to have IV solumedrol 1 gm daily- feels little better still significant weakness on legs.    REVIEW OF SYSTEMS:  CONSTITUTIONAL: No fever, fatigue or weakness.  EYES: No blurred or double vision.  EARS, NOSE, AND THROAT: No tinnitus or ear pain.  RESPIRATORY: No cough, shortness of breath, wheezing or hemoptysis.  CARDIOVASCULAR: No chest pain, orthopnea, edema.  GASTROINTESTINAL: No nausea, vomiting, diarrhea or abdominal pain.  GENITOURINARY: No dysuria, hematuria.  ENDOCRINE: No polyuria, nocturia,  HEMATOLOGY: No anemia, easy bruising or bleeding SKIN: No rash or lesion. MUSCULOSKELETAL: No joint pain or arthritis.   NEUROLOGIC: No tingling,Bilateral lower activity numbness, weakness.  PSYCHIATRY: No anxiety or depression.   ROS  DRUG ALLERGIES:   Allergies  Allergen Reactions  . Povidone Iodine Rash    VITALS:  Blood pressure 122/67, pulse 62, temperature 98 F (36.7 C), temperature source Oral, resp. rate 20, height  (1.803 m), weight 100.789 kg (222 lb 3.2 oz), SpO2 98 %.  PHYSICAL EXAMINATION:  GENERAL:  55 y.o.-year-old patient lying in the bed with no acute distress.  EYES: Pupils equal, round, reactive to light and accommodation. No scleral icterus. Extraocular muscles intact.  HEENT: Head atraumatic, normocephalic. Oropharynx and nasopharynx clear.  NECK:  Supple, no jugular venous distention. No thyroid enlargement, no tenderness.  LUNGS: Normal breath sounds bilaterally, no wheezing, rales,rhonchi or crepitation. No use of accessory muscles of respiration.  CARDIOVASCULAR: S1, S2 normal. No murmurs, rubs, or gallops.  ABDOMEN: Soft, nontender, nondistended. Bowel sounds  present. No organomegaly or mass.  EXTREMITIES: No pedal edema, cyanosis, or clubbing.  NEUROLOGIC: Cranial nerves II through XII are intact. Muscle strength 5/5 in upper extremities and 1-2/5 in both lower extremities. Sensation intact. Gait not checked.  PSYCHIATRIC: The patient is alert and oriented x 3.  SKIN: No obvious rash, lesion, or ulcer.   Physical Exam LABORATORY PANEL:   CBC  Recent Labs Lab 10/03/15 0429  WBC 7.7  HGB 12.9  HCT 37.4  PLT 191   ------------------------------------------------------------------------------------------------------------------  Chemistries   Recent Labs Lab 10/02/15 1534  NA 139  K 4.0  CL 107  CO2 27  GLUCOSE 96  BUN 19  CREATININE 0.56  CALCIUM 9.2   ------------------------------------------------------------------------------------------------------------------  Cardiac Enzymes No results for input(s): TROPONINI in the last 168 hours. ------------------------------------------------------------------------------------------------------------------  RADIOLOGY:  Dg Chest 2 View  10/02/2015  CLINICAL DATA:  Multiple sclerosis, cold symptoms for 2 days EXAM: CHEST  2 VIEW COMPARISON:  07/14/2015 FINDINGS: The heart size and mediastinal contours are within normal limits. Both lungs are clear. The visualized skeletal structures are unremarkable. IMPRESSION: No active cardiopulmonary disease. Electronically Signed   By: Elige Ko   On: 10/02/2015 16:24    ASSESSMENT AND PLAN:   Active Problems:   Multiple sclerosis (HCC)   1. MS flare with bilateral lower extremity weakness: Continue Solu-Medrol 1 g IV every day for 3-5 days. Appreciated Consult neurology. Slow improvement.  Advised to follow as out pt for maintenance therapy.  2. Lower extremity edema: Patient reports this is at her baseline. It does not appear that she has had an echo in the past. Awaited results and echo cardial gram. TSH is normal.  3.  Essential hypertension: Continue Norvasc.    All the records are reviewed and case discussed with Care Management/Social Workerr. Management plans discussed with the patient, family and they are in agreement.  CODE STATUS:  Full code TOTAL TIME TAKING CARE OF THIS PATIENT: 35 minutes.     POSSIBLE D/C IN 1-2 DAYS, DEPENDING ON CLINICAL CONDITION.   Altamese Dilling M.D on 10/04/2015   Between 7am to 6pm - Pager - [phone]  After 6pm go to [email or web address] - password [employer]  [facility]  Office  [phone]  CC: Primary care physician; Lauro Regulus., MD  Note: This dictation was prepared with Dragon dictation along with smaller phrase technology. Any transcriptional errors that result from this process are unintentional.

## 2015-10-04 NOTE — Care Management Important Message (Signed)
Important Message  Patient Details  Name: Lisa Williams MRN: [id number] Date of Birth: 26-May-1960   Medicare Important Message Given:  Yes    Olegario Messier A Myran Arcia 10/04/2015, 11:12 AM

## 2015-10-05 LAB — CREATININE, SERUM
Creatinine, Ser: 0.68 mg/dL (ref 0.44–1.00)
GFR calc Af Amer: >60 mL/min (ref >60)
GFR calc non Af Amer: >60 mL/min (ref >60)

## 2015-10-05 MED ORDER — GUAIFENESIN-DM 100-10 MG/5ML PO SYRP: 5.0000 mL | ORAL_SOLUTION | Freq: Four times a day (QID) | ORAL | Status: DC | PRN

## 2015-10-05 MED ORDER — SENNOSIDES-DOCUSATE SODIUM 8.6-50 MG PO TABS: 1.0000 | ORAL_TABLET | Freq: Every evening | ORAL | Status: DC | PRN

## 2015-10-05 NOTE — Progress Notes (Signed)
Patient given discharge instructions and prescriptions with teach back. All questions answered. No needs, denies pain. W/C out with volunteer services. No further needs.

## 2015-10-05 NOTE — Progress Notes (Signed)
Patient's heart rate was 41 on monitor, rechecked manually 45. Referred to Dr. Tobi Bastos, no new orders made, just to continue to monitor. Needs attended.

## 2015-10-05 NOTE — Discharge Summary (Signed)
[facility] [place] [facility]   PATIENT NAME: Lisa Williams    MR#:  [id number]  DATE OF BIRTH:  08-13-1960  DATE OF ADMISSION:  10/02/2015 ADMITTING PHYSICIAN: Adrian Saran, MD  DATE OF DISCHARGE: 10/05/2015  PRIMARY CARE PHYSICIAN: Lauro Regulus., MD    ADMISSION DIAGNOSIS:  Multiple sclerosis (HCC) [G35] URI (upper respiratory infection) [J06.9]  DISCHARGE DIAGNOSIS:  Active Problems:   Multiple sclerosis (HCC)  SECONDARY DIAGNOSIS:   Past Medical History  Diagnosis Date  . MS (multiple sclerosis) (HCC)   . HPV test positive   . Back pain     HOSPITAL COURSE:   1. MS flare with bilateral lower extremity weakness: Continue Solu-Medrol 1 g IV every day for 3-5 days. Appreciated Consult neurology. Slow improvement. Advised to follow as out pt for maintenance therapy.  Have improvement in weakness, now able to move with walker.  2. Lower extremity edema: Patient reports this is at her baseline. It does not appear that she has had an echo in the past. Awaited results and echo cardial gram. TSH is normal.  3. Essential hypertension: Continue Norvasc.  DISCHARGE CONDITIONS:   stable.  CONSULTS OBTAINED:  Treatment Team:  Pauletta Browns, MD  DRUG ALLERGIES:   Allergies  Allergen Reactions  . Povidone Iodine Rash    DISCHARGE MEDICATIONS:   Current Discharge Medication List    START taking these medications   Details  guaiFENesin-dextromethorphan (ROBITUSSIN DM) 100-10 MG/5ML syrup Take 5 mLs by mouth every 6 (six) hours as needed for cough. Qty: 118 mL, Refills: 0    senna-docusate (SENOKOT-S) 8.6-50 MG tablet Take 1 tablet by mouth at bedtime as needed for mild constipation. Qty: 20 tablet, Refills: 0      CONTINUE these medications which have NOT CHANGED   Details  amLODipine (NORVASC) 2.5 MG tablet Take 2.5 mg by mouth daily. Refills: 11    baclofen (LIORESAL) 20 MG tablet Take 20 mg by mouth 4 (four)  times daily.     Multiple Vitamins-Minerals (MULTIVITAMIN WITH MINERALS) tablet Take 1 tablet by mouth daily.     oxybutynin (DITROPAN) 5 MG tablet Take 5 mg by mouth 2 (two) times daily.     tiZANidine (ZANAFLEX) 4 MG tablet Take 4 mg by mouth every 6 (six) hours as needed for muscle spasms.          DISCHARGE INSTRUCTIONS:    Follow with neurologist in 2 weeks.  If you experience worsening of your admission symptoms, develop shortness of breath, life threatening emergency, suicidal or homicidal thoughts you must seek medical attention immediately by calling 911 or calling your MD immediately  if symptoms less severe.  You Must read complete instructions/literature along with all the possible adverse reactions/side effects for all the Medicines you take and that have been prescribed to you. Take any new Medicines after you have completely understood and accept all the possible adverse reactions/side effects.   Please note  You were cared for by a hospitalist during your hospital stay. If you have any questions about your discharge medications or the care you received while you were in the hospital after you are discharged, you can call the unit and asked to speak with the hospitalist on call if the hospitalist that took care of you is not available. Once you are discharged, your primary care physician will handle any further medical issues. Please note that NO REFILLS for any discharge medications will be authorized once you are discharged, as it  is imperative that you return to your primary care physician (or establish a relationship with a primary care physician if you do not have one) for your aftercare needs so that they can reassess your need for medications and monitor your lab values.    Today   CHIEF COMPLAINT:   Chief Complaint  Patient presents with  . Weakness    HISTORY OF PRESENT ILLNESS:  Lisa Williams  is a 55 y.o. female with a known history of MS who  presents with eye lateral lower extremity weakness. She reports that she is having symptoms of MS flare. She reports that when she has a cold she has a flare of MS. She has recently suffered a viral upper respiratory infection. Neurology was called while patient was in the emergency room who recommended 1 g of IV Solu-Medrol daily for 3-5 days.  VITAL SIGNS:  Blood pressure 130/50, pulse 45, temperature 98 F (36.7 C), temperature source Oral, resp. rate 18, height  (1.803 m), weight 100.789 kg (222 lb 3.2 oz), SpO2 100 %.  I/O:   Intake/Output Summary (Last 24 hours) at 10/05/15 1133 Last data filed at 10/05/15 0900  Gross per 24 hour  Intake    956 ml  Output      0 ml  Net    956 ml    PHYSICAL EXAMINATION:   GENERAL: 55 y.o.-year-old patient lying in the bed with no acute distress.  EYES: Pupils equal, round, reactive to light and accommodation. No scleral icterus. Extraocular muscles intact.  HEENT: Head atraumatic, normocephalic. Oropharynx and nasopharynx clear.  NECK: Supple, no jugular venous distention. No thyroid enlargement, no tenderness.  LUNGS: Normal breath sounds bilaterally, no wheezing, rales,rhonchi or crepitation. No use of accessory muscles of respiration.  CARDIOVASCULAR: S1, S2 normal. No murmurs, rubs, or gallops.  ABDOMEN: Soft, nontender, nondistended. Bowel sounds present. No organomegaly or mass.  EXTREMITIES: No pedal edema, cyanosis, or clubbing.  NEUROLOGIC: Cranial nerves II through XII are intact. Muscle strength 5/5 in upper extremities and 3/5 in both lower extremities. Sensation intact. Gait not checked.  PSYCHIATRIC: The patient is alert and oriented x 3.  SKIN: No obvious rash, lesion, or ulcer.   DATA REVIEW:   CBC  Recent Labs Lab 10/03/15 0429  WBC 7.7  HGB 12.9  HCT 37.4  PLT 191    Chemistries   Recent Labs Lab 10/02/15 1534 10/05/15 0416  NA 139  --   K 4.0  --   CL 107  --   CO2 27  --   GLUCOSE 96   --   BUN 19  --   CREATININE 0.56 0.68  CALCIUM 9.2  --     Cardiac Enzymes No results for input(s): TROPONINI in the last 168 hours.  Microbiology Results  Results for orders placed or performed during the hospital encounter of 07/14/15  Rapid Influenza A&B Antigens [facility] only)     Status: None   Collection Time: 07/14/15 10:48 AM  Result Value Ref Range Status   Influenza A ([facility]) NEGATIVE NEGATIVE Final   Influenza B ([facility]) NEGATIVE NEGATIVE Final    RADIOLOGY:  No results found.  Management plans discussed with the patient, family and they are in agreement.  CODE STATUS:     Code Status Orders        Start     Ordered   10/02/15 2000  Full code   Continuous     10/02/15 1959    Code Status History  Date Active Date Inactive Code Status Order ID Comments User Context   07/14/2015  4:05 PM 07/17/2015  9:20 PM Full Code [id number]  Enid Baas, MD Inpatient    Advance Directive Documentation        Most Recent Value   Type of Advance Directive  Living will   Pre-existing out of facility DNR order (yellow form or pink MOST form)     "MOST" Form in Place?        TOTAL TIME TAKING CARE OF THIS PATIENT: 35 minutes.    Altamese Dilling M.D on 10/05/2015 at 11:33 AM  Between 7am to 6pm - Pager - [phone]  After 6pm go to [email or web address] - password [email or web address]  [facility] [place] [facility]  Office  [phone]  CC: Primary care physician; Lauro Regulus., MD   Note: This dictation was prepared with Dragon dictation along with smaller phrase technology. Any transcriptional errors that result from this process are unintentional.

## 2015-10-25 DIAGNOSIS — I10 Essential (primary) hypertension: Secondary | ICD-10-CM | POA: Insufficient documentation

## 2015-10-25 DIAGNOSIS — R030 Elevated blood-pressure reading, without diagnosis of hypertension: Secondary | ICD-10-CM | POA: Insufficient documentation

## 2015-11-30 ENCOUNTER — Encounter: Payer: Medicare Other | Admitting: Obstetrics and Gynecology

## 2015-12-16 ENCOUNTER — Other Ambulatory Visit: Payer: Self-pay | Admitting: Internal Medicine

## 2015-12-16 DIAGNOSIS — R7989 Other specified abnormal findings of blood chemistry: Secondary | ICD-10-CM

## 2015-12-16 DIAGNOSIS — R945 Abnormal results of liver function studies: Secondary | ICD-10-CM

## 2015-12-22 ENCOUNTER — Ambulatory Visit
Admission: RE | Admit: 2015-12-22 | Discharge: 2015-12-22 | Disposition: A | Discharge status: Home / Self Care | Payer: Medicare Other | Source: Ambulatory Visit | Attending: Internal Medicine | Admitting: Internal Medicine

## 2015-12-22 DIAGNOSIS — R945 Abnormal results of liver function studies: Secondary | ICD-10-CM

## 2015-12-22 DIAGNOSIS — R7989 Other specified abnormal findings of blood chemistry: Secondary | ICD-10-CM | POA: Insufficient documentation

## 2016-01-17 NOTE — Progress Notes (Deleted)
ANNUAL PREVENTATIVE CARE GYN  ENCOUNTER NOTE  Subjective:       Lisa Williams is a 55 y.o. G0P0000 female here for a routine annual gynecologic exam.  Current complaints: 1.      Gynecologic History No LMP recorded. Patient has had a hysterectomy. Contraception: status post hysterectomy Last Pap: 11/2014 neg/pos. Results were: abnormal Last mammogram: 06/2014 birad 1. Results were: normal  Obstetric History OB History  Gravida Para Term Preterm AB Living  0 0 0 0 0 0  SAB TAB Ectopic Multiple Live Births  0 0 0 0          Past Medical History:  Diagnosis Date  . Back pain   . HPV test positive   . MS (multiple sclerosis) (HCC)     Past Surgical History:  Procedure Laterality Date  . bilateral tubal ligation  1996  . KNEE SURGERY     right  . LAPAROSCOPIC SUPRACERVICAL HYSTERECTOMY  08/05/2013    Current Outpatient Prescriptions on File Prior to Visit  Medication Sig Dispense Refill  . amLODipine (NORVASC) 2.5 MG tablet Take 2.5 mg by mouth daily.  11  . baclofen (LIORESAL) 20 MG tablet Take 20 mg by mouth 4 (four) times daily.     Marland Kitchen guaiFENesin-dextromethorphan (ROBITUSSIN DM) 100-10 MG/5ML syrup Take 5 mLs by mouth every 6 (six) hours as needed for cough. 118 mL 0  . Multiple Vitamins-Minerals (MULTIVITAMIN WITH MINERALS) tablet Take 1 tablet by mouth daily.     Marland Kitchen oxybutynin (DITROPAN) 5 MG tablet Take 5 mg by mouth 2 (two) times daily.     Marland Kitchen senna-docusate (SENOKOT-S) 8.6-50 MG tablet Take 1 tablet by mouth at bedtime as needed for mild constipation. 20 tablet 0  . tiZANidine (ZANAFLEX) 4 MG tablet Take 4 mg by mouth every 6 (six) hours as needed for muscle spasms.      No current facility-administered medications on file prior to visit.     Allergies  Allergen Reactions  . Povidone Iodine Rash    Social History   Social History  . Marital status: Single    Spouse name: N/A  . Number of children: N/A  . Years of education: N/A   Occupational  History  . Not on file.   Social History Main Topics  . Smoking status: Former Smoker    Packs/day: 0.50    Types: Cigarettes  . Smokeless tobacco: Never Used  . Alcohol use No  . Drug use: No  . Sexual activity: No   Other Topics Concern  . Not on file   Social History Narrative   Lives at home with parents. Has a walker for ambulation.    Family History  Problem Relation Age of Onset  . Sickle cell trait Sister   . Supraventricular tachycardia Sister   . Diabetes Maternal Grandmother   . Diabetes Maternal Aunt   . Breast cancer Paternal Grandmother   . Liver cancer Maternal Grandmother   . Hypertension Father   . Hypertension Sister   . Ovarian cancer Paternal Grandmother     The following portions of the patient's history were reviewed and updated as appropriate: allergies, current medications, past family history, past medical history, past social history, past surgical history and problem list.  Review of Systems ROS Review of Systems - General ROS: negative for - chills, fatigue, fever, hot flashes, night sweats, weight gain or weight loss Psychological ROS: negative for - anxiety, decreased libido, depression, mood swings, physical abuse or sexual  abuse Ophthalmic ROS: negative for - blurry vision, eye pain or loss of vision ENT ROS: negative for - headaches, hearing change, visual changes or vocal changes Allergy and Immunology ROS: negative for - hives, itchy/watery eyes or seasonal allergies Hematological and Lymphatic ROS: negative for - bleeding problems, bruising, swollen lymph nodes or weight loss Endocrine ROS: negative for - galactorrhea, hair pattern changes, hot flashes, malaise/lethargy, mood swings, palpitations, polydipsia/polyuria, skin changes, temperature intolerance or unexpected weight changes Breast ROS: negative for - new or changing breast lumps or nipple discharge Respiratory ROS: negative for - cough or shortness of breath Cardiovascular  ROS: negative for - chest pain, irregular heartbeat, palpitations or shortness of breath Gastrointestinal ROS: no abdominal pain, change in bowel habits, or black or bloody stools Genito-Urinary ROS: no dysuria, trouble voiding, or hematuria Musculoskeletal ROS: negative for - joint pain or joint stiffness Neurological ROS: negative for - bowel and bladder control changes Dermatological ROS: negative for rash and skin lesion changes   Objective:   There were no vitals taken for this visit. CONSTITUTIONAL: Well-developed, well-nourished female in no acute distress.  PSYCHIATRIC: Normal mood and affect. Normal behavior. Normal judgment and thought content. NEUROLGIC: Alert and oriented to person, place, and time. Normal muscle tone coordination. No cranial nerve deficit noted. HENT:  Normocephalic, atraumatic, External right and left ear normal. Oropharynx is clear and moist EYES: Conjunctivae and EOM are normal. Pupils are equal, round, and reactive to light. No scleral icterus.  NECK: Normal range of motion, supple, no masses.  Normal thyroid.  SKIN: Skin is warm and dry. No rash noted. Not diaphoretic. No erythema. No pallor. CARDIOVASCULAR: Normal heart rate noted, regular rhythm, no murmur. RESPIRATORY: Clear to auscultation bilaterally. Effort and breath sounds normal, no problems with respiration noted. BREASTS: Symmetric in size. No masses, skin changes, nipple drainage, or lymphadenopathy. ABDOMEN: Soft, normal bowel sounds, no distention noted.  No tenderness, rebound or guarding.  BLADDER: Normal PELVIC:  External Genitalia: Normal  BUS: Normal  Vagina: Normal  Cervix: Normal  Uterus: Normal  Adnexa: Normal  RV: {Blank multiple:19196::"External Exam NormaI","No Rectal Masses","Normal Sphincter tone"}  MUSCULOSKELETAL: Normal range of motion. No tenderness.  No cyanosis, clubbing, or edema.  2+ distal pulses. LYMPHATIC: No Axillary, Supraclavicular, or Inguinal  Adenopathy.    Assessment:   Annual gynecologic examination 55 y.o. Contraception: status post hysterectomy [id number] Problem List Items Addressed This Visit    MS (multiple sclerosis) (HCC)   HPV test positive   Status post laparoscopic supracervical hysterectomy    Other Visit Diagnoses    Well woman exam    -  Primary   Increased BMI          Plan:  Pap: Pap Co Test Mammogram: ordered Stool Guaiac Testing:  Ordered Labs: vit d lipid a1c fbs tsh Routine preventative health maintenance measures emphasized: {Blank multiple:19196::"Exercise/Diet/Weight control","Tobacco Warnings","Alcohol/Substance use risks","Stress Management","Peer Pressure Issues","Safe Sex"} *** Return to Clinic - 1 Year   SunGardCrystal Akya Fiorello, [place]

## 2016-01-20 ENCOUNTER — Encounter: Payer: Medicare Other | Admitting: Obstetrics and Gynecology

## 2016-01-24 DIAGNOSIS — Z0000 Encounter for general adult medical examination without abnormal findings: Secondary | ICD-10-CM | POA: Insufficient documentation

## 2016-01-24 DIAGNOSIS — Z1331 Encounter for screening for depression: Secondary | ICD-10-CM | POA: Insufficient documentation

## 2016-02-04 ENCOUNTER — Emergency Department: Payer: Medicare Other

## 2016-02-04 ENCOUNTER — Encounter: Payer: Self-pay | Admitting: Emergency Medicine

## 2016-02-04 ENCOUNTER — Emergency Department
Admission: EM | Admit: 2016-02-04 | Discharge: 2016-02-04 | Disposition: A | Discharge status: Home / Self Care | Payer: Medicare Other | Attending: Emergency Medicine | Admitting: Emergency Medicine

## 2016-02-04 DIAGNOSIS — Y92000 Kitchen of unspecified non-institutional (private) residence as  the place of occurrence of the external cause: Secondary | ICD-10-CM | POA: Diagnosis not present

## 2016-02-04 DIAGNOSIS — Y999 Unspecified external cause status: Secondary | ICD-10-CM | POA: Insufficient documentation

## 2016-02-04 DIAGNOSIS — Y9389 Activity, other specified: Secondary | ICD-10-CM | POA: Insufficient documentation

## 2016-02-04 DIAGNOSIS — Z87891 Personal history of nicotine dependence: Secondary | ICD-10-CM | POA: Diagnosis not present

## 2016-02-04 DIAGNOSIS — S99911A Unspecified injury of right ankle, initial encounter: Secondary | ICD-10-CM | POA: Diagnosis present

## 2016-02-04 DIAGNOSIS — S82891A Other fracture of right lower leg, initial encounter for closed fracture: Secondary | ICD-10-CM

## 2016-02-04 DIAGNOSIS — W19XXXA Unspecified fall, initial encounter: Secondary | ICD-10-CM

## 2016-02-04 DIAGNOSIS — Z79899 Other long term (current) drug therapy: Secondary | ICD-10-CM | POA: Diagnosis not present

## 2016-02-04 DIAGNOSIS — S8251XA Displaced fracture of medial malleolus of right tibia, initial encounter for closed fracture: Secondary | ICD-10-CM | POA: Insufficient documentation

## 2016-02-04 DIAGNOSIS — W1839XA Other fall on same level, initial encounter: Secondary | ICD-10-CM | POA: Diagnosis not present

## 2016-02-04 MED ORDER — HYDROCODONE-ACETAMINOPHEN 5-325 MG PO TABS
1.0000 | ORAL_TABLET | Freq: Once | ORAL | Status: AC
  Administered 2016-02-04: 1 via ORAL
  Filled 2016-02-04: qty 1

## 2016-02-04 MED ORDER — TRAMADOL HCL 50 MG PO TABS
50.0000 mg | ORAL_TABLET | Freq: Four times a day (QID) | ORAL | 0 refills | Status: DC | PRN
Start: 2016-02-04 — End: 2016-03-24

## 2016-02-04 NOTE — ED Provider Notes (Signed)
Time Seen: Approximately 1057  I have reviewed the triage notes  Chief Complaint: Ankle Pain and Weakness   History of Present Illness: Lisa Williams is a 55 y.o. female who presents with a recent fall this morning. Patient states that due to her multiple sclerosis she has frequent falls and she lost her balance today in the kitchen and apparently had her right leg fall out behind her and to the side. The difficulty bearing weight on her right ankle and is noticed some swelling especially with pain over the central part of her ankle along with the outside or lateral ankle region. She denies any head trauma or loss of consciousness   Past Medical History:  Diagnosis Date  . Back pain   . HPV test positive   . MS (multiple sclerosis) [facility])     Patient Active Problem List   Diagnosis Date Noted  . Multiple sclerosis (HCC) 10/02/2015  . Multiple sclerosis exacerbation (HCC) 07/14/2015  . MS (multiple sclerosis) (HCC) 11/26/2014  . Increased body mass index 11/26/2014  . HPV test positive 11/26/2014  . Status post laparoscopic supracervical hysterectomy 11/26/2014  . Galactorrhea 11/26/2014  . Tobacco user 11/26/2014  . Back ache 05/21/2014  . Adiposity 05/21/2014  . Disordered sleep 05/21/2014  . Muscle spasticity 05/21/2014  . Calculus of kidney 12/09/2013  . Renal colic 12/09/2013  . Hypercholesteremia 08/19/2013  . Hereditary and idiopathic neuropathy 08/19/2013  . Hypercholesterolemia without hypertriglyceridemia 08/19/2013  . Bladder infection, chronic 07/25/2012  . Disorder of bladder function 07/25/2012  . Incomplete bladder emptying 07/25/2012    Past Surgical History:  Procedure Laterality Date  . bilateral tubal ligation  1996  . KNEE SURGERY     right  . LAPAROSCOPIC SUPRACERVICAL HYSTERECTOMY  08/05/2013    Past Surgical History:  Procedure Laterality Date  . bilateral tubal ligation  1996  . KNEE SURGERY     right  . LAPAROSCOPIC  SUPRACERVICAL HYSTERECTOMY  08/05/2013    Current Outpatient Rx  . Order #: [id number] Class: Historical Med  . Order #: [id number] Class: Historical Med  . Order #: [id number] Class: Historical Med  . Order #: [id number] Class: Historical Med  . Order #: [id number] Class: Historical Med  . Order #: [id number] Class: Historical Med  . Order #: [id number] Class: Print    Allergies:  Amlodipine and Povidone iodine  Family History: Family History  Problem Relation Age of Onset  . Sickle cell trait Sister   . Supraventricular tachycardia Sister   . Diabetes Maternal Grandmother   . Liver cancer Maternal Grandmother   . Diabetes Maternal Aunt   . Breast cancer Paternal Grandmother   . Ovarian cancer Paternal Grandmother   . Hypertension Father   . Hypertension Sister     Social History: Social History  Substance Use Topics  . Smoking status: Former Smoker    Packs/day: 0.50    Types: Cigarettes  . Smokeless tobacco: Never Used  . Alcohol use No     Review of Systems:   10 point review of systems was performed and was otherwise negative:  Constitutional: No fever Eyes: No visual disturbances ENT: No sore throat, ear pain Cardiac: No chest pain Respiratory: No shortness of breath, wheezing, or stridor Abdomen: No abdominal pain, no vomiting, No diarrhea Endocrine: No weight loss, No night sweats Extremities: No peripheral edema, cyanosis Skin: No rashes, easy bruising Neurologic: No focal weakness, trouble with speech or swollowing Urologic: No dysuria, Hematuria, or urinary frequency   Physical Exam:  ED Triage Vitals  Enc Vitals Group     BP 02/04/16 1015 (!) 155/74     Pulse Rate 02/04/16 1015 79     Resp 02/04/16 1015 18     Temp 02/04/16 1015 98.3 F (36.8 C)     Temp Source 02/04/16 1015 Oral     SpO2 02/04/16 1012 100 %     Weight 02/04/16 1016 225 lb (102.1 kg)     Height 02/04/16 1016 5\' 11"  (1.803 m)     Head Circumference --      Peak Flow --      Pain  Score 02/04/16 1016 6     Pain Loc --      Pain Edu? --      Excl. in GC? --     General: Awake , Alert , and Oriented times 3; GCS 15 Head: Normal cephalic , atraumatic Eyes: Pupils equal , round, reactive to light Nose/Throat: No nasal drainage, patent upper airway without erythema or exudate.  Neck: Supple, Full range of motion, No anterior adenopathy or palpable thyroid masses Lungs: Clear to ascultation without wheezes , rhonchi, or rales Heart: Regular rate, regular rhythm without murmurs , gallops , or rubs Abdomen: Soft, non tender without rebound, guarding , or rigidity; bowel sounds positive and symmetric in all 4 quadrants. No organomegaly .        Extremities: Patient has tenderness over the more T's of the right ankle with stability on palpation. She has tenderness over both malleolus regions without any crepitus or step-off noted. The Achilles tendon is well aligned and intact. Neurologic: normal ambulation, Motor symmetric without deficits, sensory intact Skin: warm, dry, no rashes  Radiology:  "Dg Ankle Complete Right  Result Date: 02/04/2016 CLINICAL DATA:  Larey SeatFell today.  Injured right ankle. EXAM: RIGHT ANKLE - COMPLETE 3+ VIEW COMPARISON:  None. FINDINGS: There is diffuse and fairly marked subcutaneous soft tissue swelling/ edema. The ankle mortise is maintained. No osteochondral abnormality. Possible tiny avulsion fracture from the medial malleolus. No other abnormalities are identified. IMPRESSION: Possible tiny avulsion fracture from the medial malleolus. Electronically Signed   By: Rudie MeyerP.  Gallerani M.D.   On: 02/04/2016 11:17  "  I personally reviewed the radiologic studies   Procedures:  Right posterior splint was applied by the nurse technician    ED Course: Patient's stay here was uneventful and her right lower extremity is neurovascularly intact and she tolerated the posterior splint. She's been advised to not bear weight and less absolute necessary. The patient  normally ambulates with a walker. She does not do well with crutches and declined. Clinical Course     Assessment:  Right ankle trauma with possible small avulsion fracture left medial malleolus region   Final Clinical Impression:  Final diagnoses:  Closed right ankle fracture, initial encounter     Plan: * Patient was referred to her orthopedic surgeon Dr. Hyacinth MeekerMiller. She's been advised to rest ice and elevate the right ankle was prescribed Ultram for pain. " New Prescriptions   TRAMADOL (ULTRAM) 50 MG TABLET    Take 1 tablet (50 mg total) by mouth every 6 (six) hours as needed.  "  Patient was advised to return immediately if condition worsens. Patient was advised to follow up with their primary care physician or other specialized physicians involved in their outpatient care. The patient and/or family member/power of attorney had laboratory results reviewed at the bedside. All questions and concerns were addressed and appropriate discharge instructions were distributed by the nursing staff. *  Jennye Moccasin, MD 02/04/16 1248

## 2016-02-04 NOTE — Discharge Instructions (Signed)
Rest, ice, and elevate the right ankle. Please contact Dr. Hyacinth MeekerMiller on Monday for follow-up next week. Ambulate as tolerated.

## 2016-02-04 NOTE — ED Triage Notes (Signed)
Pt arrived via EMS from home s/p fall. Pt reports she was in her kitchen making breakfast when her legs gave away and fell with her feet going behind her in the opposite direction per patient. Pt states she has been feeling fine all week. Pt normally ambulatory with leg braces and walker. Pt reports some swelling to right ankle more than usual.

## 2016-02-07 ENCOUNTER — Other Ambulatory Visit: Payer: Self-pay | Admitting: Internal Medicine

## 2016-02-07 DIAGNOSIS — Z1231 Encounter for screening mammogram for malignant neoplasm of breast: Secondary | ICD-10-CM

## 2016-02-24 ENCOUNTER — Encounter: Payer: Medicare Other | Admitting: Obstetrics and Gynecology

## 2016-02-25 ENCOUNTER — Other Ambulatory Visit: Payer: Self-pay

## 2016-02-28 ENCOUNTER — Ambulatory Visit: Payer: Medicare Other | Admitting: Gastroenterology

## 2016-02-28 ENCOUNTER — Ambulatory Visit (INDEPENDENT_AMBULATORY_CARE_PROVIDER_SITE_OTHER): Payer: Medicare Other | Admitting: Gastroenterology

## 2016-02-28 ENCOUNTER — Encounter: Payer: Self-pay | Admitting: Gastroenterology

## 2016-02-28 VITALS — BP 151/84 | HR 66 | Temp 98.3°F | Ht 71.0 in | Wt 215.0 lb

## 2016-02-28 DIAGNOSIS — R748 Abnormal levels of other serum enzymes: Secondary | ICD-10-CM | POA: Diagnosis not present

## 2016-02-28 NOTE — Patient Instructions (Signed)
You have been given a lab order today. [facility]. If you have any questions, please give me a call. I will contact you with results when they return.

## 2016-02-28 NOTE — Progress Notes (Signed)
Gastroenterology Consultation  Referring Provider:     Lauro Regulus, MD Primary Care Physician:  Lauro Regulus., MD Primary Gastroenterologist:  Dr. Servando Snare     Reason for Consultation:     Abnormal liver enzymes        HPI:   Lisa Williams is a 55 y.o. y/o female referred for consultation & management of Abnormal liver enzymes by Dr. Lauro Regulus., MD.  As patient comes in today with a history of abnormal liver enzymes. The patient's history includes abnormal liver enzymes that started in January. Previous to that the patient had normal liver enzymes back in March. She has had 2 episodes of elevated liver enzymes back in July and August. The levels in August were slightly higher than in July. There has been no further checking of her liver enzymes since then. The patient had negative acute hepatitis panel. The patient also denies any alcohol abuse or any new medications. The patient does suffer from MS and was put on steroids back in March but her liver enzymes were normal at that time. Eyes any jaundice change in color for urine abdominal pain nausea or vomiting. She also denies any fatigue that is out of the ordinary for her multiple sclerosis. There is no report of any weight loss and the patient had an ultrasound that did not show any cause for her abnormal liver enzymes. She states that she has gained a little weight and she was told that that may contribute to her abnormal liver enzymes. The patient also reports she had a colonoscopy 4 years ago by Dr. Mechele Collin and was told that she needs another colonoscopy in 5 years. The patient came to see me today because she has family members who are my patient and she wanted to switch to me.  Past Medical History:  Diagnosis Date  . Back pain   . Calculus of kidney 12/09/2013  . Chronic left shoulder pain 07/19/2015  . Galactorrhea 11/26/2014   Chronic   . Hereditary and idiopathic neuropathy 08/19/2013  . HPV test  positive   . Hypercholesteremia 08/19/2013  . MS (multiple sclerosis) (HCC)   . Muscle spasticity 05/21/2014  . Status post laparoscopic supracervical hysterectomy 11/26/2014  . Tobacco user 11/26/2014    Past Surgical History:  Procedure Laterality Date  . bilateral tubal ligation  1996  . KNEE SURGERY     right  . LAPAROSCOPIC SUPRACERVICAL HYSTERECTOMY  08/05/2013    Prior to Admission medications   Medication Sig Start Date End Date Taking? Authorizing Provider  baclofen (LIORESAL) 20 MG tablet Take 20 mg by mouth 4 (four) times daily.    Yes Historical Provider, MD  meloxicam (MOBIC) 15 MG tablet Take 1 tablet by mouth 3 (three) times daily. 01/27/16  Yes Historical Provider, MD  tiZANidine (ZANAFLEX) 4 MG tablet Take 4 mg by mouth every 6 (six) hours as needed for muscle spasms.    Yes Historical Provider, MD  amLODipine (NORVASC) 2.5 MG tablet Take 2.5 mg by mouth daily. 07/26/15   Historical Provider, MD  Multiple Vitamins-Minerals (MULTIVITAMIN WITH MINERALS) tablet Take 1 tablet by mouth daily.     Historical Provider, MD  oxybutynin (DITROPAN) 5 MG tablet Take 5 mg by mouth 2 (two) times daily.     Historical Provider, MD  potassium chloride (K-DUR) 10 MEQ tablet Take by mouth. 11/24/15 11/23/16  Historical Provider, MD  torsemide (DEMADEX) 5 MG tablet Take by mouth. 11/24/15 11/23/16  Historical Provider, MD  traMADol (  ULTRAM) 50 MG tablet Take 1 tablet (50 mg total) by mouth every 6 (six) hours as needed. Patient not taking: Reported on 02/28/2016 02/04/16   Jennye Moccasin, MD    Family History  Problem Relation Age of Onset  . Sickle cell trait Sister   . Supraventricular tachycardia Sister   . Diabetes Maternal Grandmother   . Liver cancer Maternal Grandmother   . Diabetes Maternal Aunt   . Breast cancer Paternal Grandmother   . Ovarian cancer Paternal Grandmother   . Hypertension Father   . Hypertension Sister      Social History  Substance Use Topics  . Smoking  status: Former Smoker    Packs/day: 0.50    Types: Cigarettes  . Smokeless tobacco: Never Used  . Alcohol use No    Allergies as of 02/28/2016 - Review Complete 02/28/2016  Allergen Reaction Noted  . Amlodipine Other (See Comments) 10/25/2015  . Povidone iodine Rash 06/24/2014    Review of Systems:    All systems reviewed and negative except where noted in HPI.   Physical Exam:  BP (!) 151/84   Pulse 66   Temp 98.3 F (36.8 C) (Oral)   Ht 5\' 11"  (1.803 m)   Wt 215 lb (97.5 kg)   BMI 29.99 kg/m  No LMP recorded. Patient has had a hysterectomy. Psych:  Alert and cooperative. Normal mood and affect. General:   Alert,  Well-developed, well-nourished, pleasant and cooperative in NAD Head:  Normocephalic and atraumatic. Eyes:  Sclera clear, no icterus.   Conjunctiva pink. Ears:  Normal auditory acuity. Nose:  No deformity, discharge, or lesions. Mouth:  No deformity or lesions,oropharynx pink & moist. Neck:  Supple; no masses or thyromegaly. Lungs:  Respirations even and unlabored.  Clear throughout to auscultation.   No wheezes, crackles, or rhonchi. No acute distress. Heart:  Regular rate and rhythm; no murmurs, clicks, rubs, or gallops. Abdomen:  Normal bowel sounds.  No bruits.  Soft, non-tender and non-distended without masses, hepatosplenomegaly or hernias noted.  No guarding or rebound tenderness.  Negative Carnett sign.   Rectal:  Deferred.  Msk:  Symmetrical without gross deformities.  Good, equal movement & strength bilaterally. Pulses:  Normal pulses noted. Extremities:  Negative peripheral edema.  No cyanosis. Skin:  Intact without significant lesions or rashes.  No jaundice. Lymph Nodes:  No significant cervical adenopathy. Psych:  Alert and cooperative. Normal mood and affect.  Imaging Studies: Dg Ankle Complete Right  Result Date: 02/04/2016 CLINICAL DATA:  Larey Seat today.  Injured right ankle. EXAM: RIGHT ANKLE - COMPLETE 3+ VIEW COMPARISON:  None. FINDINGS:  There is diffuse and fairly marked subcutaneous soft tissue swelling/ edema. The ankle mortise is maintained. No osteochondral abnormality. Possible tiny avulsion fracture from the medial malleolus. No other abnormalities are identified. IMPRESSION: Possible tiny avulsion fracture from the medial malleolus. Electronically Signed   By: Rudie Meyer M.D.   On: 02/04/2016 11:17    Assessment and Plan:   Lisa Williams is a 55 y.o. y/o female who comes in today with a history of abnormal liver enzymes twice since July. The patient had her last liver enzymes back in August which were elevated. The patient's AST is lower than her ALT her ALT was 79. The patient will have her labs checked for possible causes of her abnormal liver enzymes including tests for Wilson's disease, alpha-1 antitrypsin deficiency and autoimmune hepatitis. She will also have her labs rechecked to see if her liver enzymes have gone  up or have come down. The patient has been explained the plan and agrees with it.   Note: This dictation was prepared with Dragon dictation along with smaller phrase technology. Any transcriptional errors that result from this process are unintentional.

## 2016-02-29 LAB — HEPATIC FUNCTION PANEL
ALBUMIN: 3.9 g/dL (ref 3.5–5.5)
ALK PHOS: 131 IU/L — AB (ref 39–117)
ALT: 18 IU/L (ref 0–32)
AST: 21 IU/L (ref 0–40)
BILIRUBIN, DIRECT: 0.12 mg/dL (ref 0.00–0.40)
Bilirubin Total: 0.3 mg/dL (ref 0.0–1.2)
TOTAL PROTEIN: 6.9 g/dL (ref 6.0–8.5)

## 2016-02-29 LAB — IGG, IGA, IGM
IGM (IMMUNOGLOBULIN M), SRM: 173 mg/dL (ref 26–217)
IgA/Immunoglobulin A, Serum: 233 mg/dL (ref 87–352)
IgG (Immunoglobin G), Serum: 1232 mg/dL (ref 700–1600)

## 2016-02-29 LAB — CERULOPLASMIN: CERULOPLASMIN: 31.2 mg/dL (ref 19.0–39.0)

## 2016-02-29 LAB — ALPHA-1-ANTITRYPSIN: A1 ANTITRYPSIN: 122 mg/dL (ref 90–200)

## 2016-02-29 LAB — ANTI-SMOOTH MUSCLE ANTIBODY, IGG: Smooth Muscle Ab: 30 Units — ABNORMAL HIGH (ref 0–19)

## 2016-02-29 LAB — MITOCHONDRIAL ANTIBODIES: Mitochondrial Ab: 10.5 Units (ref 0.0–20.0)

## 2016-02-29 LAB — ANA: ANA: NEGATIVE

## 2016-03-10 ENCOUNTER — Telehealth: Payer: Self-pay

## 2016-03-10 NOTE — Telephone Encounter (Signed)
Contacted pt about follow up in the office and she was wondering if we could just give her the results over the phone as she was just in the office for an appt and its difficult for her to get a ride. Please advise.

## 2016-03-10 NOTE — Telephone Encounter (Signed)
-----   Message from Midge Minium, MD sent at 03/06/2016  1:34 PM EDT ----- Please have the patient come in for a follow up.

## 2016-03-16 DIAGNOSIS — S8263XA Displaced fracture of lateral malleolus of unspecified fibula, initial encounter for closed fracture: Secondary | ICD-10-CM | POA: Insufficient documentation

## 2016-03-17 ENCOUNTER — Other Ambulatory Visit: Payer: Self-pay

## 2016-03-17 DIAGNOSIS — R748 Abnormal levels of other serum enzymes: Secondary | ICD-10-CM

## 2016-03-17 NOTE — Telephone Encounter (Signed)
Let the patient know that all of her liver enzymes are normal except her alkaline phosphatase.  She should have this checked again in a month to see if this has also come back to normal.

## 2016-03-17 NOTE — Telephone Encounter (Signed)
Pt advised to repeat labs in 1 month. Orders have been placed.

## 2016-03-23 DIAGNOSIS — S93409A Sprain of unspecified ligament of unspecified ankle, initial encounter: Secondary | ICD-10-CM | POA: Insufficient documentation

## 2016-03-24 ENCOUNTER — Encounter: Payer: Self-pay | Admitting: Intensive Care

## 2016-03-24 ENCOUNTER — Ambulatory Visit: Payer: Medicare Other

## 2016-03-24 ENCOUNTER — Inpatient Hospital Stay
Admission: EM | Admit: 2016-03-24 | Discharge: 2016-03-27 | DRG: 060 | Disposition: A | Discharge status: Skilled Nursing Facility | Payer: Medicare Other | Attending: Internal Medicine | Admitting: Internal Medicine

## 2016-03-24 DIAGNOSIS — G35 Multiple sclerosis: Principal | ICD-10-CM | POA: Diagnosis present

## 2016-03-24 DIAGNOSIS — Z883 Allergy status to other anti-infective agents status: Secondary | ICD-10-CM | POA: Diagnosis not present

## 2016-03-24 DIAGNOSIS — G609 Hereditary and idiopathic neuropathy, unspecified: Secondary | ICD-10-CM | POA: Diagnosis present

## 2016-03-24 DIAGNOSIS — Z87891 Personal history of nicotine dependence: Secondary | ICD-10-CM | POA: Diagnosis not present

## 2016-03-24 DIAGNOSIS — Z791 Long term (current) use of non-steroidal anti-inflammatories (NSAID): Secondary | ICD-10-CM

## 2016-03-24 DIAGNOSIS — N39490 Overflow incontinence: Secondary | ICD-10-CM | POA: Diagnosis present

## 2016-03-24 DIAGNOSIS — E7800 Pure hypercholesterolemia, unspecified: Secondary | ICD-10-CM | POA: Diagnosis present

## 2016-03-24 DIAGNOSIS — S93401D Sprain of unspecified ligament of right ankle, subsequent encounter: Secondary | ICD-10-CM | POA: Diagnosis not present

## 2016-03-24 DIAGNOSIS — W19XXXD Unspecified fall, subsequent encounter: Secondary | ICD-10-CM | POA: Diagnosis present

## 2016-03-24 DIAGNOSIS — R531 Weakness: Secondary | ICD-10-CM | POA: Diagnosis present

## 2016-03-24 DIAGNOSIS — Z888 Allergy status to other drugs, medicaments and biological substances status: Secondary | ICD-10-CM

## 2016-03-24 LAB — CBC WITH DIFFERENTIAL/PLATELET
BASOS ABS: 0 10*3/uL (ref 0–0.1)
BASOS PCT: 0 %
EOS PCT: 0 %
Eosinophils Absolute: 0 10*3/uL (ref 0–0.7)
HCT: 36.8 % (ref 35.0–47.0)
Hemoglobin: 12.7 g/dL (ref 12.0–16.0)
LYMPHS PCT: 14 %
Lymphs Abs: 1.6 10*3/uL (ref 1.0–3.6)
MCH: 27.1 pg (ref 26.0–34.0)
MCHC: 34.6 g/dL (ref 32.0–36.0)
MCV: 78.5 fL — AB (ref 80.0–100.0)
MONO ABS: 1.4 10*3/uL — AB (ref 0.2–0.9)
Monocytes Relative: 12 %
Neutro Abs: 8.9 10*3/uL — ABNORMAL HIGH (ref 1.4–6.5)
Neutrophils Relative %: 74 %
PLATELETS: 204 10*3/uL (ref 150–440)
RBC: 4.69 MIL/uL (ref 3.80–5.20)
RDW: 13.2 % (ref 11.5–14.5)
WBC: 11.9 10*3/uL — ABNORMAL HIGH (ref 3.6–11.0)

## 2016-03-24 LAB — COMPREHENSIVE METABOLIC PANEL
ALT: 18 U/L (ref 14–54)
ANION GAP: 6 (ref 5–15)
AST: 25 U/L (ref 15–41)
Albumin: 3.7 g/dL (ref 3.5–5.0)
Alkaline Phosphatase: 127 U/L — ABNORMAL HIGH (ref 38–126)
BUN: 13 mg/dL (ref 6–20)
CHLORIDE: 107 mmol/L (ref 101–111)
CO2: 24 mmol/L (ref 22–32)
Calcium: 8.9 mg/dL (ref 8.9–10.3)
Creatinine, Ser: 0.66 mg/dL (ref 0.44–1.00)
GFR calc Af Amer: >60 mL/min (ref >60)
Glucose, Bld: 106 mg/dL — ABNORMAL HIGH (ref 65–99)
POTASSIUM: 3.8 mmol/L (ref 3.5–5.1)
Sodium: 137 mmol/L (ref 135–145)
Total Bilirubin: 1.1 mg/dL (ref 0.3–1.2)
Total Protein: 7.6 g/dL (ref 6.5–8.1)

## 2016-03-24 LAB — TROPONIN I

## 2016-03-24 MED ORDER — SODIUM CHLORIDE 0.9 % IV SOLN
1000.0000 mg | Freq: Once | INTRAVENOUS | Status: AC
  Administered 2016-03-24: 1000 mg via INTRAVENOUS
  Filled 2016-03-24: qty 8

## 2016-03-24 MED ORDER — SENNOSIDES-DOCUSATE SODIUM 8.6-50 MG PO TABS: 1.0000 | ORAL_TABLET | Freq: Every evening | ORAL | Status: DC | PRN

## 2016-03-24 MED ORDER — CALCIUM CARBONATE-VITAMIN D 500-200 MG-UNIT PO TABS
1.0000 | ORAL_TABLET | Freq: Every day | ORAL | Status: DC
  Administered 2016-03-24 – 2016-03-27 (×4): 1 via ORAL
  Filled 2016-03-24 (×4): qty 1

## 2016-03-24 MED ORDER — ACETAMINOPHEN 325 MG PO TABS: 650.0000 mg | ORAL_TABLET | Freq: Four times a day (QID) | ORAL | Status: DC | PRN

## 2016-03-24 MED ORDER — MELOXICAM 7.5 MG PO TABS: 15.0000 mg | ORAL_TABLET | Freq: Three times a day (TID) | ORAL | Status: DC

## 2016-03-24 MED ORDER — ENOXAPARIN SODIUM 40 MG/0.4ML ~~LOC~~ SOLN
40.0000 mg | SUBCUTANEOUS | Status: DC
  Administered 2016-03-24 – 2016-03-26 (×3): 40 mg via SUBCUTANEOUS
  Filled 2016-03-24 (×3): qty 0.4

## 2016-03-24 MED ORDER — SODIUM CHLORIDE 0.9 % IV SOLN
1000.0000 mL | Freq: Once | INTRAVENOUS | Status: AC
  Administered 2016-03-24: 1000 mL via INTRAVENOUS

## 2016-03-24 MED ORDER — METHYLPREDNISOLONE SODIUM SUCC 125 MG IJ SOLR
INTRAMUSCULAR | Status: AC
  Filled 2016-03-24: qty 2

## 2016-03-24 MED ORDER — SODIUM CHLORIDE 0.9 % IV SOLN: INTRAVENOUS | Status: DC

## 2016-03-24 MED ORDER — BACLOFEN 10 MG PO TABS
20.0000 mg | ORAL_TABLET | Freq: Three times a day (TID) | ORAL | Status: DC
  Administered 2016-03-24 – 2016-03-27 (×9): 20 mg via ORAL
  Filled 2016-03-24 (×11): qty 2

## 2016-03-24 MED ORDER — ACETAMINOPHEN 650 MG RE SUPP: 650.0000 mg | Freq: Four times a day (QID) | RECTAL | Status: DC | PRN

## 2016-03-24 MED ORDER — ONDANSETRON HCL 4 MG/2ML IJ SOLN: 4.0000 mg | Freq: Four times a day (QID) | INTRAMUSCULAR | Status: DC | PRN

## 2016-03-24 MED ORDER — HYDROCODONE-ACETAMINOPHEN 5-325 MG PO TABS: 1.0000 | ORAL_TABLET | ORAL | Status: DC | PRN

## 2016-03-24 MED ORDER — TIZANIDINE HCL 4 MG PO TABS: 4.0000 mg | ORAL_TABLET | Freq: Four times a day (QID) | ORAL | Status: DC | PRN

## 2016-03-24 MED ORDER — ONDANSETRON HCL 4 MG PO TABS: 4.0000 mg | ORAL_TABLET | Freq: Four times a day (QID) | ORAL | Status: DC | PRN

## 2016-03-24 MED ORDER — SODIUM CHLORIDE 0.9 % IV SOLN
1000.0000 mg | Freq: Every day | INTRAVENOUS | Status: DC
  Administered 2016-03-25 – 2016-03-26 (×2): 1000 mg via INTRAVENOUS
  Filled 2016-03-24 (×4): qty 8

## 2016-03-24 NOTE — H&P (Addendum)
[facility] - [place] [facility]   PATIENT NAME: Lisa Williams    MR#:  [id number]  DATE OF BIRTH:  1960-09-18  DATE OF ADMISSION:  03/24/2016  PRIMARY CARE PHYSICIAN: Lauro RegulusANDERSON,MARSHALL W., MD   REQUESTING/REFERRING PHYSICIAN: Dr. Mayford KnifeWilliams  CHIEF COMPLAINT:   MS flare HISTORY OF PRESENT ILLNESS:  Lisa Williams  is a 55 y.o. female with a known history of MS who presents with lower extremity weakness. Patient reports that since yesterday she is not been able to move her lower extremities and sensation is decreased which is typical of her MS flare. She has no other deficits. She had a mechanical fall several weeks ago in which she fractured her right ankle in yesterday after a fall she sprained her ankle. She is currently in a boot and is seen by Dr. Hyacinth MeekerMiller. She saw him yesterday. She is given 1 g of IV Solu-Medrol in the emergency room for MS exacerbation She also reports that she has had increased urinary incontinence which occurs when she has her MS flares.  PAST MEDICAL HISTORY:   Past Medical History:  Diagnosis Date  . Back pain   . Calculus of kidney 12/09/2013  . Chronic left shoulder pain 07/19/2015  . Galactorrhea 11/26/2014   Chronic   . Hereditary and idiopathic neuropathy 08/19/2013  . HPV test positive   . Hypercholesteremia 08/19/2013  . MS (multiple sclerosis) (HCC)   . Muscle spasticity 05/21/2014  . Status post laparoscopic supracervical hysterectomy 11/26/2014  . Tobacco user 11/26/2014    PAST SURGICAL HISTORY:   Past Surgical History:  Procedure Laterality Date  . bilateral tubal ligation  1996  . KNEE SURGERY     right  . LAPAROSCOPIC SUPRACERVICAL HYSTERECTOMY  08/05/2013    SOCIAL HISTORY:   Social History  Substance Use Topics  . Smoking status: Former Smoker    Packs/day: 0.50    Types: Cigarettes  . Smokeless tobacco: Never Used  . Alcohol use No    FAMILY HISTORY:   Family History  Problem Relation Age of  Onset  . Sickle cell trait Sister   . Supraventricular tachycardia Sister   . Diabetes Maternal Grandmother   . Liver cancer Maternal Grandmother   . Diabetes Maternal Aunt   . Breast cancer Paternal Grandmother   . Ovarian cancer Paternal Grandmother   . Hypertension Father   . Hypertension Sister     DRUG ALLERGIES:   Allergies  Allergen Reactions  . Amlodipine Other (See Comments)    edema  . Povidone Iodine Rash    REVIEW OF SYSTEMS:   Review of Systems  Constitutional: Negative.  Negative for chills, fever and malaise/fatigue.  HENT: Negative.  Negative for ear discharge, ear pain, hearing loss, nosebleeds and sore throat.   Eyes: Negative.  Negative for blurred vision and pain.  Respiratory: Negative.  Negative for cough, hemoptysis, shortness of breath and wheezing.   Cardiovascular: Negative.  Negative for chest pain, palpitations and leg swelling.  Gastrointestinal: Negative.  Negative for abdominal pain, blood in stool, diarrhea, nausea and vomiting.  Genitourinary: Positive for frequency. Negative for dysuria.  Musculoskeletal: Negative.  Negative for back pain.  Skin: Negative.   Neurological: Positive for focal weakness. Negative for dizziness, tremors, speech change, seizures and headaches.  Endo/Heme/Allergies: Negative.  Does not bruise/bleed easily.  Psychiatric/Behavioral: Negative.  Negative for depression, hallucinations and suicidal ideas.    MEDICATIONS AT HOME:   Prior to Admission medications   Medication Sig Start Date End Date  Taking? Authorizing Provider  baclofen (LIORESAL) 20 MG tablet Take 20 mg by mouth 3 (three) times daily.    Yes Historical Provider, MD  Calcium Carb-Cholecalciferol (CALCIUM 600+D) 600-800 MG-UNIT TABS Take 1 tablet by mouth daily.   Yes Historical Provider, MD  meloxicam (MOBIC) 15 MG tablet Take 1 tablet by mouth 3 (three) times daily. 01/27/16   Historical Provider, MD  tiZANidine (ZANAFLEX) 4 MG tablet Take 4 mg by  mouth every 6 (six) hours as needed for muscle spasms.     Historical Provider, MD      VITAL SIGNS:  Blood pressure (!) 164/77, pulse 83, temperature 98.4 F (36.9 C), temperature source Oral, resp. rate 18, height 5\' 11"  (1.803 m), weight 105.7 kg (233 lb), SpO2 100 %.  PHYSICAL EXAMINATION:   Physical Exam  Constitutional: She is oriented to person, place, and time and well-developed, well-nourished, and in no distress. No distress.  HENT:  Head: Normocephalic.  Eyes: No scleral icterus.  Neck: Normal range of motion. Neck supple. No JVD present. No tracheal deviation present.  Cardiovascular: Normal rate, regular rhythm and normal heart sounds.  Exam reveals no gallop and no friction rub.   No murmur heard. Pulmonary/Chest: Effort normal and breath sounds normal. No respiratory distress. She has no wheezes. She has no rales. She exhibits no tenderness.  Abdominal: Soft. Bowel sounds are normal. She exhibits no distension and no mass. There is no tenderness. There is no rebound and no guarding.  Musculoskeletal: Normal range of motion. She exhibits edema.  Right foot in boot Left leg chronic LEE with indentation from TED hose she wears  Neurological: She is alert and oriented to person, place, and time.  Skin: Skin is warm. No rash noted. No erythema.  Psychiatric: Affect and judgment normal.      LABORATORY PANEL:   CBC  Recent Labs Lab 03/24/16 0903  WBC 11.9*  HGB 12.7  HCT 36.8  PLT 204   ------------------------------------------------------------------------------------------------------------------  Chemistries   Recent Labs Lab 03/24/16 0903  NA 137  K 3.8  CL 107  CO2 24  GLUCOSE 106*  BUN 13  CREATININE 0.66  CALCIUM 8.9  AST 25  ALT 18  ALKPHOS 127*  BILITOT 1.1   ------------------------------------------------------------------------------------------------------------------  Cardiac Enzymes  Recent Labs Lab 03/24/16 0903  TROPONINI  <0.03   ------------------------------------------------------------------------------------------------------------------  RADIOLOGY:  No results found.  EKG:   Sinus rhythm no ST elevation  IMPRESSION AND PLAN:   55 year old female with MS who presents with bilateral lower extremity weakness and decreased sensation typical MS exacerbation.  1. Bilateral lower extremity weakness due to MS exacerbation: Patient will require 1 g of Solu-Medrol every 24 hours for at least 3-5 days  2. MS: Continue baclofen and Zanaflex.   All the records are reviewed and case discussed with ED provider. Management plans discussed with the patient and she in agreement  CODE STATUS: full  TOTAL TIME TAKING CARE OF THIS PATIENT: 45 minutes minutes.    Tarea Skillman M.D on 03/24/2016 at 10:37 AM  Between 7am to 6pm - Pager - [phone]  After 6pm go to [email or web address] - password [email or web address]  [facility]  Office  [phone]  CC: Primary care physician; Lauro Regulus., MD

## 2016-03-24 NOTE — Consult Note (Signed)
Reason for Consult:MS exacerbation Referring Physician: Mody  CC: Lower extremity weakness  HPI: Lisa Williams is an 55 y.o. female with a history of MS who since yesterday has not been able to move her lower extremities and sensation is decreased in her lower extremities. She also reports that since yesterday she has also been urinating every 15 minutes.  She does not describe incontinence.  Due to her inability to ambulate she presented for evaluation.    Past Medical History:  Diagnosis Date  . Back pain   . Calculus of kidney 12/09/2013  . Chronic left shoulder pain 07/19/2015  . Galactorrhea 11/26/2014   Chronic   . Hereditary and idiopathic neuropathy 08/19/2013  . HPV test positive   . Hypercholesteremia 08/19/2013  . MS (multiple sclerosis) (HCC)   . Muscle spasticity 05/21/2014  . Status post laparoscopic supracervical hysterectomy 11/26/2014  . Tobacco user 11/26/2014    Past Surgical History:  Procedure Laterality Date  . bilateral tubal ligation  1996  . KNEE SURGERY     right  . LAPAROSCOPIC SUPRACERVICAL HYSTERECTOMY  08/05/2013    Family History  Problem Relation Age of Onset  . Sickle cell trait Sister   . Supraventricular tachycardia Sister   . Diabetes Maternal Grandmother   . Liver cancer Maternal Grandmother   . Diabetes Maternal Aunt   . Breast cancer Paternal Grandmother   . Ovarian cancer Paternal Grandmother   . Hypertension Father   . Hypertension Sister     Social History:  reports that she has quit smoking. Her smoking use included Cigarettes. She smoked 0.50 packs per day. She has never used smokeless tobacco. She reports that she does not drink alcohol or use drugs.  Allergies  Allergen Reactions  . Amlodipine Other (See Comments)    edema  . Povidone Iodine Rash    Medications:  I have reviewed the patient's current medications. Prior to Admission:  Prescriptions Prior to Admission  Medication Sig Dispense Refill Last Dose  .  baclofen (LIORESAL) 20 MG tablet Take 20 mg by mouth 3 (three) times daily.    03/23/2016 at 1700  . Calcium Carb-Cholecalciferol (CALCIUM 600+D) 600-800 MG-UNIT TABS Take 1 tablet by mouth daily.   03/23/2016 at Unknown time  . meloxicam (MOBIC) 15 MG tablet Take 1 tablet by mouth 3 (three) times daily.  2 Not Taking at Unknown time  . tiZANidine (ZANAFLEX) 4 MG tablet Take 4 mg by mouth every 6 (six) hours as needed for muscle spasms.    Not Taking at Unknown time   Scheduled: . baclofen  20 mg Oral TID  . calcium-vitamin D  1 tablet Oral Daily  . enoxaparin (LOVENOX) injection  40 mg Subcutaneous Q24H  . [START ON 03/25/2016] methylPREDNISolone (SOLU-MEDROL) injection  1,000 mg Intravenous Daily    ROS: History obtained from the patient  General ROS: negative for - chills, fatigue, fever, night sweats, weight gain or weight loss Psychological ROS: negative for - behavioral disorder, hallucinations, memory difficulties, mood swings or suicidal ideation Ophthalmic ROS: negative for - blurry vision, double vision, eye pain or loss of vision ENT ROS: negative for - epistaxis, nasal discharge, oral lesions, sore throat, tinnitus or vertigo Allergy and Immunology ROS: negative for - hives or itchy/watery eyes Hematological and Lymphatic ROS: negative for - bleeding problems, bruising or swollen lymph nodes Endocrine ROS: negative for - galactorrhea, hair pattern changes, polydipsia/polyuria or temperature intolerance Respiratory ROS: negative for - cough, hemoptysis, shortness of breath or wheezing  Cardiovascular ROS: negative for - chest pain, dyspnea on exertion, edema or irregular heartbeat Gastrointestinal ROS: negative for - abdominal pain, diarrhea, hematemesis, nausea/vomiting or stool incontinence Genito-Urinary ROS: negative for - dysuria, hematuria, incontinence or urinary frequency/urgency Musculoskeletal ROS: right ankle pain Neurological ROS: as noted in HPI Dermatological ROS:  negative for rash and skin lesion changes  Physical Examination: Blood pressure (!) 143/63, pulse 97, temperature 98.6 F (37 C), temperature source Oral, resp. rate 18, height 5\' 11"  (1.803 m), weight 105.7 kg (233 lb), SpO2 95 %.  HEENT-  Normocephalic, no lesions, without obvious abnormality.  Normal external eye and conjunctiva.  Normal TM's bilaterally.  Normal auditory canals and external ears. Normal external nose, mucus membranes and septum.  Normal pharynx. Cardiovascular- S1, S2 normal, pulses palpable throughout   Lungs- chest clear, no wheezing, rales, normal symmetric air entry Abdomen- soft, non-tender; bowel sounds normal; no masses,  no organomegaly Extremities- no edema Lymph-no adenopathy palpable Musculoskeletal-ace around right ankle Skin-warm and dry, no hyperpigmentation, vitiligo, or suspicious lesions  Neurological Examination Mental Status: Alert, oriented, thought content appropriate.  Speech fluent without evidence of aphasia.  Able to follow 3 step commands without difficulty. Cranial Nerves: II: Discs flat bilaterally; Visual fields grossly normal, pupils equal, round, reactive to light and accommodation III,IV, VI: ptosis not present, extra-ocular motions intact bilaterally V,VII: smile symmetric, facial light touch sensation normal bilaterally VIII: hearing normal bilaterally IX,X: gag reflex present XI: bilateral shoulder shrug XII: midline tongue extension Motor: Right : Upper extremity   5/5    Left:     Upper extremity   5/5  Lower extremity   1/5     Lower extremity   1-2/5 Tone and bulk:normal tone throughout; no atrophy noted Sensory: Pinprick and light touch decreased to the groin Deep Tendon Reflexes: 3+ and symmetric  Plantars: Right: upgoing   Left: upgoing Cerebellar: Normal finger-to-nose testing.  Unable to perform heel to shin due to weakness.   Gait: not tested due to weakness   Laboratory Studies:   Basic Metabolic  Panel:  Recent Labs Lab 03/24/16 0903  NA 137  K 3.8  CL 107  CO2 24  GLUCOSE 106*  BUN 13  CREATININE 0.66  CALCIUM 8.9    Liver Function Tests:  Recent Labs Lab 03/24/16 0903  AST 25  ALT 18  ALKPHOS 127*  BILITOT 1.1  PROT 7.6  ALBUMIN 3.7   No results for input(s): LIPASE, AMYLASE in the last 168 hours. No results for input(s): AMMONIA in the last 168 hours.  CBC:  Recent Labs Lab 03/24/16 0903  WBC 11.9*  NEUTROABS 8.9*  HGB 12.7  HCT 36.8  MCV 78.5*  PLT 204    Cardiac Enzymes:  Recent Labs Lab 03/24/16 0903  TROPONINI <0.03    BNP: Invalid input(s): POCBNP  CBG: No results for input(s): GLUCAP in the last 168 hours.  Microbiology: Results for orders placed or performed during the hospital encounter of 07/14/15  Rapid Influenza A&B Antigens ([facility] only)     Status: None   Collection Time: 07/14/15 10:48 AM  Result Value Ref Range Status   Influenza A ([facility]) NEGATIVE NEGATIVE Final   Influenza B ([facility]) NEGATIVE NEGATIVE Final    Coagulation Studies: No results for input(s): LABPROT, INR in the last 72 hours.  Urinalysis: No results for input(s): COLORURINE, LABSPEC, PHURINE, GLUCOSEU, HGBUR, BILIRUBINUR, KETONESUR, PROTEINUR, UROBILINOGEN, NITRITE, LEUKOCYTESUR in the last 168 hours.  Invalid input(s): APPERANCEUR  Lipid Panel:  Component Value Date/Time   CHOL 178 11/27/2014 0956   TRIG 74 11/27/2014 0956   HDL 51 11/27/2014 0956   CHOLHDL 3.5 11/27/2014 0956   LDLCALC 112 (H) 11/27/2014 0956    HgbA1C:  Lab Results  Component Value Date   HGBA1C 5.2 11/27/2014    Urine Drug Screen:  No results found for: LABOPIA, COCAINSCRNUR, LABBENZ, AMPHETMU, THCU, LABBARB  Alcohol Level: No results for input(s): ETH in the last 168 hours.  Other results: EKG: sinus rhythm at 95 bpm.  Imaging: No results found.   Assessment/Plan: 55 year old female with a history of MS presenting with a typical MS flare.  Patient with  lower extremity numbness and weakness.  Also may very likely be having overflow incontinence.    Recommendations: 1.  Solumedrol 1g daily for 3 days 2.  Bladder scan after voiding 3.  PT/OT evaluation  Thana Farr, MD Neurology [phone] 03/24/2016, 7:45 PM

## 2016-03-24 NOTE — ED Provider Notes (Signed)
[facility] Emergency Department Provider Note        Time seen: ----------------------------------------- 8:39 AM on 03/24/2016 -----------------------------------------    I have reviewed the triage vital signs and the nursing notes.   HISTORY  Chief Complaint Multiple Sclerosis    HPI Lisa Williams is a 55 y.o. female who presents to ER being brought by EMS from home. Patient states she's in an MS crisis and able to walk with her cane this morning. Patient states last crisis was in May, usually this is triggered by URI. She is unclear what caused her MS flare today. Patient states she's also had polyuria and incontinence which is typical for her MS flareup. Patient states usually she gets a gram of Solu-Medrol for several days and this helps her symptoms. She denies recent illness.   Past Medical History:  Diagnosis Date  . Back pain   . Calculus of kidney 12/09/2013  . Chronic left shoulder pain 07/19/2015  . Galactorrhea 11/26/2014   Chronic   . Hereditary and idiopathic neuropathy 08/19/2013  . HPV test positive   . Hypercholesteremia 08/19/2013  . MS (multiple sclerosis) (HCC)   . Muscle spasticity 05/21/2014  . Status post laparoscopic supracervical hysterectomy 11/26/2014  . Tobacco user 11/26/2014    Patient Active Problem List   Diagnosis Date Noted  . Health care maintenance 01/24/2016  . Blood pressure elevated without history of HTN 10/25/2015  . Multiple sclerosis (HCC) 10/02/2015  . Chronic left shoulder pain 07/19/2015  . Multiple sclerosis exacerbation (HCC) 07/14/2015  . MS (multiple sclerosis) (HCC) 11/26/2014  . Increased body mass index 11/26/2014  . HPV test positive 11/26/2014  . Status post laparoscopic supracervical hysterectomy 11/26/2014  . Galactorrhea 11/26/2014  . Tobacco user 11/26/2014  . Back ache 05/21/2014  . Adiposity 05/21/2014  . Disordered sleep 05/21/2014  . Muscle spasticity 05/21/2014  .  Calculus of kidney 12/09/2013  . Renal colic 12/09/2013  . Hypercholesteremia 08/19/2013  . Hereditary and idiopathic neuropathy 08/19/2013  . Hypercholesterolemia without hypertriglyceridemia 08/19/2013  . Bladder infection, chronic 07/25/2012  . Disorder of bladder function 07/25/2012  . Incomplete bladder emptying 07/25/2012  . Microscopic hematuria 07/25/2012    Past Surgical History:  Procedure Laterality Date  . bilateral tubal ligation  1996  . KNEE SURGERY     right  . LAPAROSCOPIC SUPRACERVICAL HYSTERECTOMY  08/05/2013    Allergies Amlodipine and Povidone iodine  Social History Social History  Substance Use Topics  . Smoking status: Former Smoker    Packs/day: 0.50    Types: Cigarettes  . Smokeless tobacco: Never Used  . Alcohol use No    Review of Systems Constitutional: Negative for fever. Cardiovascular: Negative for chest pain. Respiratory: Negative for shortness of breath. Gastrointestinal: Negative for abdominal pain, vomiting and diarrhea. Genitourinary: Positive for incontinence, polyuria Musculoskeletal: Negative for back pain. Skin: Negative for rash. Neurological: Positive for generalized weakness  10-point ROS otherwise negative.  ____________________________________________   PHYSICAL EXAM:  VITAL SIGNS: ED Triage Vitals  Enc Vitals Group     BP 03/24/16 0831 (!) 174/80     Pulse Rate 03/24/16 0831 96     Resp 03/24/16 0831 (!) 21     Temp 03/24/16 0831 98.4 F (36.9 C)     Temp Source 03/24/16 0831 Oral     SpO2 03/24/16 0831 100 %     Weight 03/24/16 0832 233 lb (105.7 kg)     Height 03/24/16 0832 5\' 11"  (1.803 m)  Head Circumference --      Peak Flow --      Pain Score --      Pain Loc --      Pain Edu? --      Excl. in GC? --     Constitutional: Alert and oriented. Well appearing and in no distress. Eyes: Conjunctivae are normal. PERRL. Normal extraocular movements. ENT   Head: Normocephalic and atraumatic.    Nose: No congestion/rhinnorhea.   Mouth/Throat: Mucous membranes are moist.   Neck: No stridor. Cardiovascular: Normal rate, regular rhythm. No murmurs, rubs, or gallops. Respiratory: Normal respiratory effort without tachypnea nor retractions. Breath sounds are clear and equal bilaterally. No wheezes/rales/rhonchi. Gastrointestinal: Soft and nontender. Normal bowel sounds Musculoskeletal: Nontender with normal range of motion in all extremities. No lower extremity tenderness nor edema. Neurologic:  Normal speech and language. No gross focal neurologic deficits are appreciated. Generalized weakness, nothing focal. Patient is unable to bear weight Skin:  Skin is warm, dry and intact. No rash noted. Psychiatric: Mood and affect are normal. Speech and behavior are normal.  ____________________________________________  EKG: Interpreted by me. Sinus rhythm with a rate of 95 bpm, normal PR interval, normal QRS width, normal QT, LVH.  ____________________________________________  ED COURSE:  Pertinent labs & imaging results that were available during my care of the patient were reviewed by me and considered in my medical decision making (see chart for details). Clinical Course   Patient presents to ER with weakness, likely MS flare. We will start Solu-Medrol, discussed with neurology.  Procedures ____________________________________________   LABS (pertinent positives/negatives)  Labs Reviewed  CBC WITH DIFFERENTIAL/PLATELET - Abnormal; Notable for the following:       Result Value   WBC 11.9 (*)    MCV 78.5 (*)    Neutro Abs 8.9 (*)    Monocytes Absolute 1.4 (*)    All other components within normal limits  COMPREHENSIVE METABOLIC PANEL - Abnormal; Notable for the following:    Glucose, Bld 106 (*)    Alkaline Phosphatase 127 (*)    All other components within normal limits  TROPONIN I  URINALYSIS COMPLETEWITH MICROSCOPIC ([facility] ONLY)  CBG MONITORING, ED    ___________________________________________  FINAL ASSESSMENT AND PLAN  Multiple sclerosis  Plan: Patient with labs and imaging as dictated above. Patient is in no distress, I have started her on a gram of Solu-Medrol. I have discussed with neurology who recommends hospitalist admission. Patient is agreeable to plan of this time.   Emily FilbertWilliams, Jonathan E, MD   Note: This dictation was prepared with Dragon dictation. Any transcriptional errors that result from this process are unintentional    Emily FilbertJonathan E Williams, MD 03/24/16 1013

## 2016-03-24 NOTE — ED Triage Notes (Signed)
Patient arrived by EMS from home. Patient states "I am in MS crisis and unable to walk with my cane this AM" pt A&O x4. Last crisis was May. Pt sees Dr. [name]

## 2016-03-24 NOTE — Progress Notes (Signed)
PT Cancellation Note  Patient Details Name: Lisa Williams MRN: [id number] DOB: [birth year]   Cancelled Treatment:    Reason Eval/Treat Not Completed: Other (comment)  PT to order received for start date 11/18.  Spoke with pt and pt agreed to start tomorrow.  Pt reports decreased sensation in LE's and boot for R ankle secondary to fracture on Sept 29th.Anders Grant.   Flay Ghosh A Pariss Hommes, PT 03/24/2016, 4:46 PM

## 2016-03-25 LAB — BASIC METABOLIC PANEL
ANION GAP: 4 — AB (ref 5–15)
BUN: 22 mg/dL — ABNORMAL HIGH (ref 6–20)
CHLORIDE: 111 mmol/L (ref 101–111)
CO2: 25 mmol/L (ref 22–32)
Calcium: 8.9 mg/dL (ref 8.9–10.3)
Creatinine, Ser: 0.68 mg/dL (ref 0.44–1.00)
GFR calc non Af Amer: >60 mL/min (ref >60)
Glucose, Bld: 139 mg/dL — ABNORMAL HIGH (ref 65–99)
POTASSIUM: 3.7 mmol/L (ref 3.5–5.1)
SODIUM: 140 mmol/L (ref 135–145)

## 2016-03-25 MED ORDER — OXYBUTYNIN CHLORIDE 5 MG PO TABS
5.0000 mg | ORAL_TABLET | Freq: Two times a day (BID) | ORAL | Status: DC
  Administered 2016-03-25 – 2016-03-27 (×5): 5 mg via ORAL
  Filled 2016-03-25 (×5): qty 1

## 2016-03-25 NOTE — Progress Notes (Signed)
Post void bladder scan to reveal , on second reading. Will continue to monitor

## 2016-03-25 NOTE — Progress Notes (Signed)
Patient ID: Lisa Williams, female   DOB: 08-20-1960, 55 y.o.   MRN: [id number]    [facility] [place] [facility]   PATIENT NAME: Lisa Williams    MR#:  [id number]  DATE OF BIRTH:  08-20-1960  SUBJECTIVE:  CHIEF COMPLAINT:   Chief Complaint  Patient presents with  . Multiple Sclerosis  Patient seen and examined. Chart reviewed and care assumed. Patient reports some moderate improvement in her lower extremity weakness since admission and the initiation of IV steroids. She already was seen in consultation by neurology. Patient states at baseline she walks at home with a walker and has a wheelchair for when she goes outside of her home.  REVIEW OF SYSTEMS:  ROS Positive for bilateral lower extremity weakness, right ankle soreness secondary to fracture. Positive for increased urinary incontinence Review of Systems  Constitutional: Negative.  Negative for chills, fever and malaise/fatigue.  HENT: Negative.  Negative for ear discharge, ear pain, hearing loss, nosebleeds and sore throat.   Eyes: Negative.  Negative for blurred vision and pain.  Respiratory: Negative.  Negative for cough, hemoptysis, shortness of breath and wheezing.   Cardiovascular: Negative.  Negative for chest pain, palpitations and leg swelling.  Gastrointestinal: Negative.  Negative for abdominal pain, blood in stool, diarrhea, nausea and vomiting.  Genitourinary: Positive for frequency. Negative for dysuria.  Musculoskeletal: Negative.  Negative for back pain.  Skin: Negative.   Neurological: Positive for focal weakness. Negative for dizziness, tremors, speech change, seizures and headaches.  Endo/Heme/Allergies: Negative.  Does not bruise/bleed easily.  Psychiatric/Behavioral: Negative.  Negative for depression, hallucinations and suicidal ideas.  DRUG ALLERGIES:   Allergies  Allergen Reactions  . Amlodipine Other (See Comments)    edema  . Povidone Iodine Rash   VITALS:   Blood pressure 138/65, pulse 82, temperature 97.8 F (36.6 C), temperature source Oral, resp. rate 18, height 5\' 11"  (1.803 m), weight 105.7 kg (233 lb), SpO2 97 %. PHYSICAL EXAMINATION:  Physical Exam  Physical Exam  Constitutional: She is oriented to person, place, and time and well-developed, well-nourished, and in no distress. No distress.  HENT:  Head: Normocephalic.  Eyes: No scleral icterus.  Neck: Normal range of motion. Neck supple. No JVD present. No tracheal deviation present.  Cardiovascular: Normal rate, regular rhythm and normal heart sounds.  Exam reveals no gallop and no friction rub.   No murmur heard. Pulmonary/Chest: Effort normal and breath sounds normal. No respiratory distress. She has no wheezes. She has no rales. She exhibits no tenderness.  Abdominal: Soft. Bowel sounds are normal. She exhibits no distension and no mass. There is no tenderness. There is no rebound and no guarding.  Musculoskeletal: Normal range of motion. She exhibits mild right lower extremity edema  Right foot in boot Left leg chronic LEE with indentation from TED hose she wears  Neurological: She is alert and oriented to person, place, and time. Muscle strength bilateral lower extremities 3/5. Upper extremity strength 5 out of 5 Skin: Skin is warm. No rash noted. No erythema.  Psychiatric: Affect and judgment normal.  LABORATORY PANEL:   CBC  Recent Labs Lab 03/24/16 0903  WBC 11.9*  HGB 12.7  HCT 36.8  PLT 204   ------------------------------------------------------------------------------------------------------------------ Chemistries   Recent Labs Lab 03/24/16 0903 03/25/16 0418  NA 137 140  K 3.8 3.7  CL 107 111  CO2 24 25  GLUCOSE 106* 139*  BUN 13 22*  CREATININE 0.66 0.68  CALCIUM 8.9 8.9  AST  25  --   ALT 18  --   ALKPHOS 127*  --   BILITOT 1.1  --    RADIOLOGY:  No results found. ASSESSMENT AND PLAN:   55 year old female with MS who presents with  bilateral lower extremity weakness and decreased sensation typical MS exacerbation.  1. Bilateral lower extremity weakness due to MS exacerbation: Patient will require 1 g of Solu-Medrol every 24 hours for at least 3-5 days Neurology consult and recommendations appreciated. Physical therapy evaluation and place.  2. Urinary incontinence possibly secondary to MS exacerbation versus overflow incontinence Bladder scan after voiding  3. History of MS: Continue baclofen and Zanaflex.   All the records are reviewed and case discussed with ED provider. Management plans discussed with the patient and she in agreement  CODE STATUS: Full  TOTAL TIME TAKING CARE OF THIS PATIENT: 45 minutes  More than 50% of the time was spent in counseling/coordination of care: YES  POSSIBLE D/C IN 2-3 DAYS, DEPENDING ON CLINICAL CONDITION.   Jeramyah Goodpasture D.O. on 03/25/2016 at 11:44 AM  Between 7am to 6pm - Pager - [phone]  After 6pm go to [email or web address] - Social research officer, government  [facility]  Office  [phone]  CC: Primary care physician; Lauro Regulus., MD  Note: This dictation was prepared with Dragon dictation along with smaller phrase technology. Any transcriptional errors that result from this process are unintentional.

## 2016-03-25 NOTE — Progress Notes (Signed)
Subjective: Patient feels some improvement from initial dose of Solumedrol.  Bladder scan on yesterday showed volume over 250cc.    Objective: Current vital signs: BP 138/65 (BP Location: Left Arm)   Pulse 82   Temp 97.8 F (36.6 C) (Oral)   Resp 18   Ht 5\' 11"  (1.803 m)   Wt 105.7 kg (233 lb)   SpO2 97%   BMI 32.50 kg/m  Vital signs in last 24 hours: Temp:  [97.8 F (36.6 C)-98.7 F (37.1 C)] 97.8 F (36.6 C) (11/18 0531) Pulse Rate:  [59-97] 82 (11/18 0747) Resp:  [18-19] 18 (11/18 0747) BP: (138-164)/(63-84) 138/65 (11/18 0747) SpO2:  [81 %-97 %] 97 % (11/18 0747)  Intake/Output from previous day: 11/17 0701 - 11/18 0700 In: 460 [P.O.:460] Out: 200 [Urine:200] Intake/Output this shift: Total I/O In: 120 [P.O.:120] Out: -  Nutritional status: Diet regular Room service appropriate? Yes; Fluid consistency: Thin  Neurologic Exam: Mental Status: Alert, oriented, thought content appropriate.  Speech fluent without evidence of aphasia.  Able to follow 3 step commands without difficulty. Cranial Nerves: II: Discs flat bilaterally; Visual fields grossly normal, pupils equal, round, reactive to light and accommodation III,IV, VI: ptosis not present, extra-ocular motions intact bilaterally V,VII: smile symmetric, facial light touch sensation normal bilaterally VIII: hearing normal bilaterally IX,X: gag reflex present XI: bilateral shoulder shrug XII: midline tongue extension Motor: Right :  Upper extremity   5/5                                      Left:     Upper extremity   5/5             Lower extremity   1-2/5                                                  Lower extremity   1-2/5 Tone and bulk:normal tone throughout; no atrophy noted Sensory: Pinprick and light touch decreased to the groin   Lab Results: Basic Metabolic Panel:  Recent Labs Lab 03/24/16 0903 03/25/16 0418  NA 137 140  K 3.8 3.7  CL 107 111  CO2 24 25  GLUCOSE 106* 139*  BUN 13 22*   CREATININE 0.66 0.68  CALCIUM 8.9 8.9    Liver Function Tests:  Recent Labs Lab 03/24/16 0903  AST 25  ALT 18  ALKPHOS 127*  BILITOT 1.1  PROT 7.6  ALBUMIN 3.7   No results for input(s): LIPASE, AMYLASE in the last 168 hours. No results for input(s): AMMONIA in the last 168 hours.  CBC:  Recent Labs Lab 03/24/16 0903  WBC 11.9*  NEUTROABS 8.9*  HGB 12.7  HCT 36.8  MCV 78.5*  PLT 204    Cardiac Enzymes:  Recent Labs Lab 03/24/16 0903  TROPONINI <0.03    Lipid Panel: No results for input(s): CHOL, TRIG, HDL, CHOLHDL, VLDL, LDLCALC in the last 168 hours.  CBG: No results for input(s): GLUCAP in the last 168 hours.  Microbiology: Results for orders placed or performed during the hospital encounter of 07/14/15  Rapid Influenza A&B Antigens [facility] only)     Status: None   Collection Time: 07/14/15 10:48 AM  Result Value Ref Range Status   Influenza A ([facility]) NEGATIVE NEGATIVE  Final   Influenza B ([facility]) NEGATIVE NEGATIVE Final    Coagulation Studies: No results for input(s): LABPROT, INR in the last 72 hours.  Imaging: No results found.  Medications:  I have reviewed the patient's current medications. Scheduled: . baclofen  20 mg Oral TID  . calcium-vitamin D  1 tablet Oral Daily  . enoxaparin (LOVENOX) injection  40 mg Subcutaneous Q24H  . methylPREDNISolone (SOLU-MEDROL) injection  1,000 mg Intravenous Daily  . oxybutynin  5 mg Oral BID    Assessment/Plan: MS exacerbation.  Patient tolerating Solumedrol.  Suspect overflow incontinence.  Would like to avoid foley if possible.    Recommendations: 1.  Ditropan 5mg  BID to start today.  Bladder scan to be performed after initiation of Ditropan 2.  Continue Solumedrol 3.  Continue therapy    LOS: 1 day   Thana Farr, MD Neurology [phone] 03/25/2016  11:51 AM

## 2016-03-25 NOTE — Progress Notes (Signed)
Physical Therapy Evaluation Patient Details Name: Lisa Williams Jobst MRN: [id number] DOB: 1960/07/04 Today's Date: 03/25/2016   History of Present Illness  Lisa Williams  is a 55 y.o. female with a known history of MS who presents with lower extremity weakness. Patient reports that since yesterday she is not been able to move her lower extremities and sensation is decreased which is typical of her MS flare. She has no other deficits. She had a mechanical fall several weeks ago in which she fractured her right ankle in yesterday after a fall she sprained her ankle. She is currently in a boot  Clinical Impression  Patient is 55 yr old female with no reports of pain and decreased mobility after a recent fracture of right ankle and a fall. She has poor strength BLE and needs min assist t for sit to stand transfers and takes 2 steps to the bed with RLE boot and LLE AFO. She need max assist for sit to supine bed mobility to raise her LE's into the bed. A SNF is recommended for improving her mobility. She will benefit from skilled PT to improve her mobility.     Follow Up Recommendations SNF    Equipment Recommendations  Rolling walker with 5" wheels    Recommendations for Other Services       Precautions / Restrictions Precautions Precautions: Fall Required Braces or Orthoses: Other Brace/Splint (LLE AFO, R ankle boot) Restrictions Weight Bearing Restrictions: No      Mobility  Bed Mobility Overal bed mobility: Modified Independent (uses railis)             General bed mobility comments: modified independent  Transfers Overall transfer level: Needs assistance Equipment used: Rolling walker (2 wheeled)             General transfer comment: min assist sit to stand  Ambulation/Gait Ambulation/Gait assistance: Mod assist Ambulation Distance (Feet): 2 Feet Assistive device: Rolling walker (2 wheeled) Gait Pattern/deviations: Narrow base of  support;Scissoring;Shuffle;Decreased stride length     General Gait Details: poor quality stepping BLE  Stairs            Wheelchair Mobility    Modified Rankin (Stroke Patients Only)       Balance                                             Pertinent Vitals/Pain Pain Assessment: No/denies pain    Home Living Family/patient expects to be discharged to:: Private residence Living Arrangements: Other relatives Available Help at Discharge: Family Type of Home: House Home Access: Level entry     Home Layout: Two level;Able to live on main level with bedroom/bathroom Home Equipment: Dan Humphreys - 2 wheels;Wheelchair - manual      Prior Function Level of Independence: Independent with assistive device(s)         Comments: was driving, walking some in the community; denies any recent falls; mod I for self care ADLs;      Hand Dominance        Extremity/Trunk Assessment   Upper Extremity Assessment: Overall WFL for tasks assessed           Lower Extremity Assessment: RLE deficits/detail;LLE deficits/detail RLE Deficits / Details: 0/5 ankle/knee/hip LLE Deficits / Details: left ankle 2/5 DF, -3/5 knee ext/flex, 0/5 hip flex  Cervical / Trunk Assessment: Normal  Communication   Communication: No  difficulties  Cognition Arousal/Alertness: Awake/alert Behavior During Therapy: WFL for tasks assessed/performed Overall Cognitive Status: Within Functional Limits for tasks assessed                      General Comments      Exercises     Assessment/Plan    PT Assessment Patient needs continued PT services  PT Problem List Decreased strength;Decreased activity tolerance;Decreased balance          PT Treatment Interventions Gait training;Therapeutic activities;Therapeutic exercise;Balance training    PT Goals (Current goals can be found in the Care Plan section)  Acute Rehab PT Goals Patient Stated Goal: to walk PT Goal  Formulation: With patient Time For Goal Achievement: 04/15/16 Potential to Achieve Goals: Fair    Frequency Min 2X/week   Barriers to discharge Decreased caregiver support      Co-evaluation               End of Session     Patient left: in bed Nurse Communication: Mobility status    Functional Limitation: Mobility: Walking and moving around Mobility: Walking and Moving Around Current Status ([id number]): At least 80 percent but less than 100 percent impaired, limited or restricted Mobility: Walking and Moving Around Goal Status [phone]): At least 60 percent but less than 80 percent impaired, limited or restricted    Time: 1420-1450 PT Time Calculation (min) (ACUTE ONLY): 30 min   Charges:   PT Evaluation $PT Eval Moderate Complexity: 1 Procedure PT Treatments $Therapeutic Activity: 8-22 mins   PT G Codes:   PT G-Codes **NOT FOR INPATIENT CLASS** Functional Limitation: Mobility: Walking and moving around Mobility: Walking and Moving Around Current Status ([id number]([id number]): At least 80 percent but less than 100 percent impaired, limited or restricted Mobility: Walking and Moving Around Goal Status [phone]): At least 60 percent but less than 80 percent impaired, limited or restricted   Ezekiel InaKristine S Lexandra Rettke, PT, DPT [place], Kameka Whan S 03/25/2016, 2:59 PM

## 2016-03-26 LAB — BASIC METABOLIC PANEL
ANION GAP: 5 (ref 5–15)
BUN: 34 mg/dL — ABNORMAL HIGH (ref 6–20)
CALCIUM: 9 mg/dL (ref 8.9–10.3)
CHLORIDE: 113 mmol/L — AB (ref 101–111)
CO2: 25 mmol/L (ref 22–32)
Creatinine, Ser: 0.7 mg/dL (ref 0.44–1.00)
GFR calc non Af Amer: >60 mL/min (ref >60)
Glucose, Bld: 129 mg/dL — ABNORMAL HIGH (ref 65–99)
Potassium: 3.8 mmol/L (ref 3.5–5.1)
Sodium: 143 mmol/L (ref 135–145)

## 2016-03-26 NOTE — Progress Notes (Signed)
Patient ID: Lisa Williams, female   DOB: 05-19-1960, 55 y.o.   MRN: [id number]   [facility] [place] [facility]   PATIENT NAME: Lisa Williams    MR#:  [id number]  DATE OF BIRTH:  05-19-1960  SUBJECTIVE:  CHIEF COMPLAINT:   Chief Complaint  Patient presents with  . Multiple Sclerosis  Patient seen and examined.  Reports improvement in weakness with some increased mobility with walker.  No issues with urination.    REVIEW OF SYSTEMS:  ROS  Review of Systems  Constitutional: Negative. Negative for chills, feverand malaise/fatigue.  HENT: Negative. Negative for ear discharge, ear pain, hearing loss, nosebleedsand sore throat.  Eyes: Negative. Negative for blurred visionand pain.  Respiratory: Negative. Negative for cough, hemoptysis, shortness of breathand wheezing.  Cardiovascular: Negative. Negative for chest pain, palpitationsand leg swelling.  Gastrointestinal: Negative. Negative for abdominal pain, blood in stool, diarrhea, nauseaand vomiting.  Genitourinary: Positive for frequency. Negative for dysuria.  Musculoskeletal: Negative. Negative for back pain.  Skin: Negative.  Neurological: Positive for focal weakness. Negative for dizziness, tremors, speech change, seizuresand headaches.  Endo/Heme/Allergies: Negative. Does not bruise/bleed easily.  Psychiatric/Behavioral: Negative. Negative for depression, hallucinationsand suicidal ideas.   DRUG ALLERGIES:   Allergies  Allergen Reactions  . Amlodipine Other (See Comments)    edema  . Povidone Iodine Rash   VITALS:  Blood pressure 123/60, pulse (!) 56, temperature 98.4 F (36.9 C), temperature source Oral, resp. rate 18, height 5\' 11"  (1.803 m), weight 105.7 kg (233 lb), SpO2 99 %. PHYSICAL EXAMINATION:  Physical Exam  Constitutional: She is oriented to person, place, and timeand well-developed, well-nourished, and in no distress. No distress.  HENT:  Head:  Normocephalic.  Eyes: No scleral icterus.  Mouth: MMM Neck: Normal range of motion. Neck supple. No JVDpresent. No tracheal deviationpresent.  Cardiovascular: Normal rate, regular rhythmand normal heart sounds. Exam reveals no gallopand no friction rub.  No murmurheard. Pulmonary/Chest: Effort normaland breath sounds normal. No respiratory distress. She has no wheezes. She has no rales. She exhibits no tenderness.  Abdominal: Soft. Bowel sounds are normal. She exhibits no distensionand no mass. There is no tenderness. There is no reboundand no guarding.  Musculoskeletal: Normal range of motion. She exhibits mild right lower extremity edema  Right ankle wrapped.  Neurological: She is alertand oriented to person, place, and time. Muscle strength bilateral lower extremities 3/5. Upper extremity strength 5 out of 5 Skin: Skin is warm. No rashnoted. No erythema.  Psychiatric: Affectand judgmentnormal.  LABORATORY PANEL:   CBC  Recent Labs Lab 03/24/16 0903  WBC 11.9*  HGB 12.7  HCT 36.8  PLT 204   ------------------------------------------------------------------------------------------------------------------ Chemistries   Recent Labs Lab 03/24/16 0903  03/26/16 0432  NA 137  < > 143  K 3.8  < > 3.8  CL 107  < > 113*  CO2 24  < > 25  GLUCOSE 106*  < > 129*  BUN 13  < > 34*  CREATININE 0.66  < > 0.70  CALCIUM 8.9  < > 9.0  AST 25  --   --   ALT 18  --   --   ALKPHOS 127*  --   --   BILITOT 1.1  --   --   < > = values in this interval not displayed. RADIOLOGY:  No results found. ASSESSMENT AND PLAN:   55 year old female with MS who presents with bilateral lower extremity weakness and decreased sensation typical MS exacerbation.  1. Bilateral  lower extremity weakness due to MS exacerbation: Day 3 of IV steroids.   Neurology consult and recommendations appreciated. Physical therapy evaluation and place.  2. Urinary incontinence possibly secondary to  MS exacerbation versus overflow incontinence Bladder scan after voiding  3. History of MS: Continue baclofen and Zanaflex.   All the records are reviewed and case discussed  Management plans discussed with the patient and shein agreement  CODE STATUS: Full  TOTAL TIME TAKING CARE OF THIS PATIENT:35 minutes  More than 50% of the time was spent in counseling/coordination of care: YES  POSSIBLE D/C IN 1-2 DAYS, DEPENDING ON CLINICAL CONDITION.    Alianah Lofton D.O. on 03/26/2016 at 2:07 PM  Between 7am to 6pm - Pager - [phone]  After 6pm go to [email or web address] - Social research officer, government  [facility]  Office  [phone]  CC: Primary care physician; Lauro Regulus., MD  Note: This dictation was prepared with Dragon dictation along with smaller phrase technology. Any transcriptional errors that result from this process are unintentional.

## 2016-03-26 NOTE — Progress Notes (Signed)
A&O patient, denies pain or needs. High fall risk with exit alarm activated. Call light within reach.

## 2016-03-26 NOTE — Progress Notes (Signed)
Subjective: Patient awake and alert.  Reports some improvement but not able to get up as good as she used to.    Objective: Current vital signs: BP 123/60 (BP Location: Right Arm)   Pulse (!) 56   Temp 98.4 F (36.9 C) (Oral)   Resp 18   Ht 5\' 11"  (1.803 m)   Wt 105.7 kg (233 lb)   SpO2 99%   BMI 32.50 kg/m  Vital signs in last 24 hours: Temp:  [98.2 F (36.8 C)-98.5 F (36.9 C)] 98.4 F (36.9 C) (11/19 0735) Pulse Rate:  [55-84] 56 (11/19 0735) Resp:  [18-19] 18 (11/19 0735) BP: (121-134)/(60-68) 123/60 (11/19 0735) SpO2:  [95 %-100 %] 99 % (11/19 0735)  Intake/Output from previous day: 11/18 0701 - 11/19 0700 In: 418 [P.O.:360; IV Piggyback:58] Out: -  Intake/Output this shift: Total I/O In: 240 [P.O.:240] Out: -  Nutritional status: Diet regular Room service appropriate? Yes; Fluid consistency: Thin  Neurologic Exam: Mental Status: Alert, oriented, thought content appropriate. Speech fluent without evidence of aphasia. Able to follow 3 step commands without difficulty. Cranial Nerves: II: Discs flat bilaterally; Visual fields grossly normal, pupils equal, round, reactive to light and accommodation III,IV, VI: ptosis not present, extra-ocular motions intact bilaterally V,VII: smile symmetric, facial light touch sensation normal bilaterally VIII: hearing normal bilaterally IX,X: gag reflex present XI: bilateral shoulder shrug XII: midline tongue extension Motor: Right :Upper extremity 5/5Left: Upper extremity 5/5 Lower extremity 1-2/5Lower extremity 2-3/5 Tone and bulk:normal tone throughout; no atrophy noted Sensory: Pinprick and light touch decreased to the groin  Lab Results: Basic Metabolic Panel:  Recent Labs Lab 03/24/16 0903 03/25/16 0418 03/26/16 0432  NA 137 140 143  K 3.8 3.7 3.8  CL 107 111 113*  CO2 24 25 25   GLUCOSE 106* 139*  129*  BUN 13 22* 34*  CREATININE 0.66 0.68 0.70  CALCIUM 8.9 8.9 9.0    Liver Function Tests:  Recent Labs Lab 03/24/16 0903  AST 25  ALT 18  ALKPHOS 127*  BILITOT 1.1  PROT 7.6  ALBUMIN 3.7   No results for input(s): LIPASE, AMYLASE in the last 168 hours. No results for input(s): AMMONIA in the last 168 hours.  CBC:  Recent Labs Lab 03/24/16 0903  WBC 11.9*  NEUTROABS 8.9*  HGB 12.7  HCT 36.8  MCV 78.5*  PLT 204    Cardiac Enzymes:  Recent Labs Lab 03/24/16 0903  TROPONINI <0.03    Lipid Panel: No results for input(s): CHOL, TRIG, HDL, CHOLHDL, VLDL, LDLCALC in the last 168 hours.  CBG: No results for input(s): GLUCAP in the last 168 hours.  Microbiology: Results for orders placed or performed during the hospital encounter of 07/14/15  Rapid Influenza A&B Antigens ([facility] only)     Status: None   Collection Time: 07/14/15 10:48 AM  Result Value Ref Range Status   Influenza A ([facility]) NEGATIVE NEGATIVE Final   Influenza B ([facility]) NEGATIVE NEGATIVE Final    Coagulation Studies: No results for input(s): LABPROT, INR in the last 72 hours.  Imaging: No results found.  Medications:  I have reviewed the patient's current medications. Scheduled: . baclofen  20 mg Oral TID  . calcium-vitamin D  1 tablet Oral Daily  . enoxaparin (LOVENOX) injection  40 mg Subcutaneous Q24H  . methylPREDNISolone (SOLU-MEDROL) injection  1,000 mg Intravenous Daily  . oxybutynin  5 mg Oral BID    Assessment/Plan: Today 3/3 of Solumedrol.  Will re-evaluate in AM for need of further  steroids.  May not be improving as quickly due to right ankle injury as opposed to actual continued weakness.  Patient reports LLE strength close to baseline.  Patient tolerating steroids well.     LOS: 2 days   Thana FarrLeslie Alliana Mcauliff, MD Neurology [phone] 03/26/2016  11:13 AM

## 2016-03-27 LAB — BASIC METABOLIC PANEL
Anion gap: 3 — ABNORMAL LOW (ref 5–15)
BUN: 35 mg/dL — AB (ref 6–20)
CALCIUM: 8.6 mg/dL — AB (ref 8.9–10.3)
CO2: 28 mmol/L (ref 22–32)
CREATININE: 0.67 mg/dL (ref 0.44–1.00)
Chloride: 112 mmol/L — ABNORMAL HIGH (ref 101–111)
GFR calc Af Amer: >60 mL/min (ref >60)
Glucose, Bld: 130 mg/dL — ABNORMAL HIGH (ref 65–99)
Potassium: 3.6 mmol/L (ref 3.5–5.1)
SODIUM: 143 mmol/L (ref 135–145)

## 2016-03-27 MED ORDER — HYDROCODONE-ACETAMINOPHEN 5-325 MG PO TABS: 1.0000 | ORAL_TABLET | Freq: Four times a day (QID) | ORAL | 0 refills | Status: DC | PRN

## 2016-03-27 MED ORDER — OXYBUTYNIN CHLORIDE 5 MG PO TABS: 5.0000 mg | ORAL_TABLET | Freq: Two times a day (BID) | ORAL | Status: DC

## 2016-03-27 MED ORDER — MELOXICAM 15 MG PO TABS: 15.0000 mg | ORAL_TABLET | Freq: Two times a day (BID) | ORAL | 2 refills | Status: DC | PRN

## 2016-03-27 NOTE — Discharge Summary (Signed)
[facility] - [place] at [facility]   PATIENT NAME: Lisa Williams    MR#:  [id number]  DATE OF BIRTH:  1960/10/15  DATE OF ADMISSION:  03/24/2016 ADMITTING PHYSICIAN: Wyatt Haste, MD  DATE OF DISCHARGE: No discharge date for patient encounter.  PRIMARY CARE PHYSICIAN: Lauro Regulus., MD   ADMISSION DIAGNOSIS:  Multiple sclerosis exacerbation (HCC) [G35]  DISCHARGE DIAGNOSIS:  Active Problems:   Multiple sclerosis exacerbation (HCC)   SECONDARY DIAGNOSIS:   Past Medical History:  Diagnosis Date  . Back pain   . Calculus of kidney 12/09/2013  . Chronic left shoulder pain 07/19/2015  . Galactorrhea 11/26/2014   Chronic   . Hereditary and idiopathic neuropathy 08/19/2013  . HPV test positive   . Hypercholesteremia 08/19/2013  . MS (multiple sclerosis) (HCC)   . Muscle spasticity 05/21/2014  . Status post laparoscopic supracervical hysterectomy 11/26/2014  . Tobacco user 11/26/2014     ADMITTING HISTORY  HISTORY OF PRESENT ILLNESS:  Lisa Williams  is a 55 y.o. female with a known history of MS who presents with lower extremity weakness. Patient reports that since yesterday she is not been able to move her lower extremities and sensation is decreased which is typical of her MS flare. She has no other deficits. She had a mechanical fall several weeks ago in which she fractured her right ankle in yesterday after a fall she sprained her ankle. She is currently in a boot and is seen by Dr. Hyacinth Meeker. She saw him yesterday. She is given 1 g of IV Solu-Medrol in the emergency room for MS exacerbation She also reports that she has had increased urinary incontinence which occurs when she has her MS flares.  HOSPITAL COURSE:   55 year old female with MS who presents with bilateral lower extremity weakness and decreased sensation typical MS exacerbation.  1. Bilateral lower extremity weakness due to MS exacerbation: Had 3 days of IV Solu-Medrol.    Neurology consult and recommendations appreciated. Physical therapy Evaluation done. SNF  2. Urinary incontinence possibly secondary to MS exacerbation versus overflow incontinence Improved with oxybutynin.  3. History ofMS: Continue baclofen  Stable for discharge to skilled nursing facility. Will need neurology follow-up in 1-2 weeks.  CONSULTS OBTAINED:  Treatment Team:  Kym Groom, MD Thana Farr, MD  DRUG ALLERGIES:   Allergies  Allergen Reactions  . Amlodipine Other (See Comments)    edema  . Povidone Iodine Rash    DISCHARGE MEDICATIONS:   Current Discharge Medication List    START taking these medications   Details  HYDROcodone-acetaminophen (NORCO/VICODIN) 5-325 MG tablet Take 1 tablet by mouth every 6 (six) hours as needed for severe pain. Qty: 12 tablet, Refills: 0    oxybutynin (DITROPAN) 5 MG tablet Take 1 tablet (5 mg total) by mouth 2 (two) times daily.      CONTINUE these medications which have CHANGED   Details  meloxicam (MOBIC) 15 MG tablet Take 1 tablet (15 mg total) by mouth 2 (two) times daily as needed for pain. Refills: 2      CONTINUE these medications which have NOT CHANGED   Details  baclofen (LIORESAL) 20 MG tablet Take 20 mg by mouth 3 (three) times daily.     Calcium Carb-Cholecalciferol (CALCIUM 600+D) 600-800 MG-UNIT TABS Take 1 tablet by mouth daily.      STOP taking these medications     tiZANidine (ZANAFLEX) 4 MG tablet         Today   VITAL SIGNS:  Blood pressure 133/69, pulse (!) 50, temperature 98.2 F (36.8 C), temperature source Oral, resp. rate 18, height 5\' 11"  (1.803 m), weight 105.7 kg (233 lb), SpO2 100 %.  I/O:   Intake/Output Summary (Last 24 hours) at 03/27/16 1209 Last data filed at 03/27/16 1031  Gross per 24 hour  Intake              538 ml  Output                0 ml  Net              538 ml    PHYSICAL EXAMINATION:  Physical Exam  GENERAL:  55 y.o.-year-old patient lying in  the bed with no acute distress.  LUNGS: Normal breath sounds bilaterally, no wheezing, rales,rhonchi or crepitation. No use of accessory muscles of respiration.  CARDIOVASCULAR: S1, S2 normal. No murmurs, rubs, or gallops.  ABDOMEN: Soft, non-tender, non-distended. Bowel sounds present. No organomegaly or mass.  NEUROLOGIC: Moves all 4 extremities. PSYCHIATRIC: The patient is alert and oriented x 3.  SKIN: No obvious rash, lesion, or ulcer.   DATA REVIEW:   CBC  Recent Labs Lab 03/24/16 0903  WBC 11.9*  HGB 12.7  HCT 36.8  PLT 204    Chemistries   Recent Labs Lab 03/24/16 0903  03/27/16 0343  NA 137  < > 143  K 3.8  < > 3.6  CL 107  < > 112*  CO2 24  < > 28  GLUCOSE 106*  < > 130*  BUN 13  < > 35*  CREATININE 0.66  < > 0.67  CALCIUM 8.9  < > 8.6*  AST 25  --   --   ALT 18  --   --   ALKPHOS 127*  --   --   BILITOT 1.1  --   --   < > = values in this interval not displayed.  Cardiac Enzymes  Recent Labs Lab 03/24/16 0903  TROPONINI <0.03    Microbiology Results  Results for orders placed or performed during the hospital encounter of 07/14/15  Rapid Influenza A&B Antigens ([facility] only)     Status: None   Collection Time: 07/14/15 10:48 AM  Result Value Ref Range Status   Influenza A ([facility]) NEGATIVE NEGATIVE Final   Influenza B ([facility]) NEGATIVE NEGATIVE Final    RADIOLOGY:  No results found.  Follow up with PCP in 1 week.  Management plans discussed with the patient, family and they are in agreement.  CODE STATUS:     Code Status Orders        Start     Ordered   03/24/16 1214  Full code  Continuous     03/24/16 1213    Code Status History    Date Active Date Inactive Code Status Order ID Comments User Context   10/02/2015  7:59 PM 10/05/2015  5:30 PM Full Code [id number]  Adrian Saran, MD Inpatient   07/14/2015  4:05 PM 07/17/2015  9:20 PM Full Code [id number]  Enid Baas, MD Inpatient    Advance Directive Documentation   Flowsheet Row Most  Recent Value  Type of Advance Directive  Living will  Pre-existing out of facility DNR order (yellow form or pink MOST form)  No data  "MOST" Form in Place?  No data      TOTAL TIME TAKING CARE OF THIS PATIENT ON DAY OF DISCHARGE: more than 30 minutes.   Zackerie Sara, [employer].D  on 03/27/2016 at 12:09 PM  Between 7am to 6pm - Pager - [phone]  After 6pm go to [email or web address] - password [email or web address] [facility]  [facility] [place] [facility]  Office  [phone]  CC: Primary care physician; Lauro RegulusANDERSON,MARSHALL W., MD  Note: This dictation was prepared with Dragon dictation along with smaller phrase technology. Any transcriptional errors that result from this process are unintentional.

## 2016-03-27 NOTE — Care Management Important Message (Signed)
Important Message  Patient Details  Name: Delsa SaleJacqueline Renee Treiber MRN: [id number] Date of Birth: [birth year]   Medicare Important Message Given:  Yes    Marily MemosLisa M Aldina Porta, RN 03/27/2016, 2:50 PM

## 2016-03-27 NOTE — Progress Notes (Signed)
Spoke with Char, [employer] rep at [phone], to notify of non-emergent EMS transport.  Auth notification reference given as [id number].   Service date range good for 90 days starting from 03/27/16.   Geographical gap exception requested to determine if services can be considered at an in-network level.

## 2016-03-27 NOTE — Clinical Social Work Note (Signed)
Clinical Social Work Assessment  Patient Details  Name: Lisa Williams MRN: [id number] Date of Birth: August 02, 1960  Date of referral:  03/27/16               Reason for consult:  Facility Placement                Permission sought to share information with:  Chartered certified accountant granted to share information::  Yes, Verbal Permission Granted  Name::      [place]::   [place]   Relationship::     Contact Information:     Housing/Transportation Living arrangements for the past 2 months:  [place] of Information:  Patient Patient Interpreter Needed:  None Criminal Activity/Legal Involvement Pertinent to Current Situation/Hospitalization:  No - Comment as needed Significant Relationships:  Other Family Members, Parents Lives with:  Siblings Do you feel safe going back to the place where you live?  Yes Need for family participation in patient care:  Yes (Comment)  Care giving concerns:  Patient lives in [place] wither her sister Lisa Williams.    Social Worker assessment / plan:  Holiday representative (CSW) received verbal consult from RN case manager that recommendation is SNF. CSW met with patient alone at bedside to discuss D/C plan. Patient was alert and oriented and sitting up in the bed. CSW introduced self and explained role of CSW department. Per patient she has MS and lives with her sister Lisa Williams. CSW explained SNF process. Patient is agreeable to SNF search in [facility] and prefers [facility]   FL2 complete and faxed out. CSW presented bed offers. Patient chose [facility] because [facility] was full and did not extend a bed offer. Patient is medically stable for D/C to [facility] today. Per [facility] liaision patient will go to room 610. RN will call report to RN Yaakov Guthrie RN at [phone] and arrange EMS for transport. CSW sent D/C orders to [facility] via [place]. Patient is aware of above. CSW attempted to  contact patient's aunt Stanton Kidney however she did not answer and a voicemail was left. Please reconsult if future social work needs arise. CSW signing off.   Employment status:  Retired Nurse, adult PT Recommendations:  [facility] / Referral to community resources:  [place]  Patient/Family's Response to care:  Patient is agreeable to going to [facility] today.   Patient/Family's Understanding of and Emotional Response to Diagnosis, Current Treatment, and Prognosis:  Patient was pleasant and thanked CSW for assistance.   Emotional Assessment Appearance:  Appears stated age Attitude/Demeanor/Rapport:    Affect (typically observed):  Accepting, Adaptable, Pleasant Orientation:  Oriented to Self, Oriented to Place, Oriented to  Time, Oriented to Situation Alcohol / Substance use:  Not Applicable Psych involvement (Current and /or in the community):  No (Comment)  Discharge Needs  Concerns to be addressed:  Discharge Planning Concerns Readmission within the last 30 days:  No Current discharge risk:  Dependent with Mobility Barriers to Discharge:  Continued Medical Work up   [employer], Veronia Beets, LCSW 03/27/2016, 2:51 PM

## 2016-03-27 NOTE — Clinical Social Work Placement (Signed)
   CLINICAL SOCIAL WORK PLACEMENT  NOTE  Date:  03/27/2016  Patient Details  Name: Lisa SaleJacqueline Renee Exley MRN: [id number] Date of Birth: 1960-06-26  Clinical Social Work is seeking post-discharge placement for this patient at the Skilled  Nursing Facility level of care (*CSW will initial, date and re-position this form in  chart as items are completed):  Yes   Patient/family provided with [facility] Clinical Social Work Department's list of facilities offering this level of care within the geographic area requested by the patient (or if unable, by the patient's family).  Yes   Patient/family informed of their freedom to choose among providers that offer the needed level of care, that participate in Medicare, Medicaid or managed care program needed by the patient, have an available bed and are willing to accept the patient.  Yes   Patient/family informed of [facility] ownership interest [facility] and [facility], as well as of the fact that they are under no obligation to receive care at these facilities.  PASRR submitted to EDS on 03/27/16     PASRR number received on 03/27/16     Existing PASRR number confirmed on       FL2 transmitted to all facilities in geographic area requested by pt/family on 03/27/16     FL2 transmitted to all facilities within larger geographic area on       Patient informed that his/her managed care company has contracts with or will negotiate with certain facilities, including the following:        Yes   Patient/family informed of bed offers received.  Patient chooses bed at  ([facility] )     Physician recommends and patient chooses bed at      Patient to be transferred to  ([facility] ) on 03/27/16.  Patient to be transferred to facility by  [facility]     Patient family notified on 03/27/16 of transfer.  Name of family member notified:   (CSW left patient's aunt Corrie DandyMary a Engineer, technical salesvoicemail. )     PHYSICIAN       Additional Comment:     _______________________________________________ Oluwafemi Villella, Darleen CrockerBailey M, LCSW 03/27/2016, 2:50 PM

## 2016-03-27 NOTE — NC FL2 (Signed)
[place] MEDICAID FL2 LEVEL OF CARE SCREENING TOOL     IDENTIFICATION  Patient Name: Lisa Williams Birthdate: 03/10/1961 Sex: female Admission Date (Current Location): 03/24/2016  [place] and [place] Number:  Chiropodist and Address:  [facility], [address] [place], [place] [address]      Provider Number: [id number]  Attending Physician Name and Address:  Milagros Loll, MD  Relative Name and Phone Number:       Current Level of Care: Hospital Recommended Level of Care: Skilled Nursing Facility Prior Approval Number:    Date Approved/Denied:   PASRR Number:  [id number])  Discharge Plan: SNF    Current Diagnoses: Patient Active Problem List   Diagnosis Date Noted  . Health care maintenance 01/24/2016  . Blood pressure elevated without history of HTN 10/25/2015  . Multiple sclerosis (HCC) 10/02/2015  . Chronic left shoulder pain 07/19/2015  . Multiple sclerosis exacerbation (HCC) 07/14/2015  . MS (multiple sclerosis) (HCC) 11/26/2014  . Increased body mass index 11/26/2014  . HPV test positive 11/26/2014  . Status post laparoscopic supracervical hysterectomy 11/26/2014  . Galactorrhea 11/26/2014  . Tobacco user 11/26/2014  . Back ache 05/21/2014  . Adiposity 05/21/2014  . Disordered sleep 05/21/2014  . Muscle spasticity 05/21/2014  . Calculus of kidney 12/09/2013  . Renal colic 12/09/2013  . Hypercholesteremia 08/19/2013  . Hereditary and idiopathic neuropathy 08/19/2013  . Hypercholesterolemia without hypertriglyceridemia 08/19/2013  . Bladder infection, chronic 07/25/2012  . Disorder of bladder function 07/25/2012  . Incomplete bladder emptying 07/25/2012  . Microscopic hematuria 07/25/2012    Orientation RESPIRATION BLADDER Height & Weight     Self, Time, Situation, Place  Normal Continent Weight: 233 lb (105.7 kg) Height:  5\' 11"  (180.3 cm)  BEHAVIORAL SYMPTOMS/MOOD NEUROLOGICAL BOWEL NUTRITION  STATUS   (none)  (none) Continent Diet (Diet: Regular )  AMBULATORY STATUS COMMUNICATION OF NEEDS Skin   Extensive Assist Verbally Normal                       Personal Care Assistance Level of Assistance  Bathing, Feeding, Dressing Bathing Assistance: Limited assistance Feeding assistance: Independent Dressing Assistance: Limited assistance     Functional Limitations Info  Sight, Hearing, Speech Sight Info: Adequate Hearing Info: Adequate Speech Info: Adequate    SPECIAL CARE FACTORS FREQUENCY  PT (By licensed PT), OT (By licensed OT)     PT Frequency:  (5) OT Frequency:  (5)            Contractures      Additional Factors Info  Code Status, Allergies Code Status Info:  (Full Code. ) Allergies Info:  (Amlodipine, Povidone Iodine)           Current Medications (03/27/2016):  This is the current hospital active medication list Current Facility-Administered Medications  Medication Dose Route Frequency Provider Last Rate Last Dose  . acetaminophen (TYLENOL) tablet 650 mg  650 mg Oral Q6H PRN Adrian Saran, MD       Or  . acetaminophen (TYLENOL) suppository 650 mg  650 mg Rectal Q6H PRN Adrian Saran, MD      . baclofen (LIORESAL) tablet 20 mg  20 mg Oral TID Adrian Saran, MD   20 mg at 03/27/16 1009  . calcium-vitamin D (OSCAL WITH D) 500-200 MG-UNIT per tablet 1 tablet  1 tablet Oral Daily Adrian Saran, MD   1 tablet at 03/27/16 1009  . enoxaparin (LOVENOX) injection 40 mg  40 mg Subcutaneous  Q24H Adrian SaranSital Mody, MD   40 mg at 03/26/16 2030  . HYDROcodone-acetaminophen (NORCO/VICODIN) 5-325 MG per tablet 1-2 tablet  1-2 tablet Oral Q4H PRN Adrian SaranSital Mody, MD      . ondansetron (ZOFRAN) tablet 4 mg  4 mg Oral Q6H PRN Adrian SaranSital Mody, MD       Or  . ondansetron (ZOFRAN) injection 4 mg  4 mg Intravenous Q6H PRN Adrian SaranSital Mody, MD      . oxybutynin (DITROPAN) tablet 5 mg  5 mg Oral BID Thana FarrLeslie Reynolds, MD   5 mg at 03/27/16 1010  . senna-docusate (Senokot-S) tablet 1 tablet  1 tablet Oral  QHS PRN Adrian SaranSital Mody, MD      . tiZANidine (ZANAFLEX) tablet 4 mg  4 mg Oral Q6H PRN Adrian SaranSital Mody, MD         Discharge Medications: Please see discharge summary for a list of discharge medications.  Relevant Imaging Results:  Relevant Lab Results:   Additional Information  (SSN: [id number])  Elman Dettman, Darleen CrockerBailey M, LCSW

## 2016-03-27 NOTE — Discharge Instructions (Signed)
Activity as tolerated     Regular diet

## 2016-03-27 NOTE — Progress Notes (Signed)
Report called to Britney at [facility]. Pt ready for discharge via ems.

## 2016-03-27 NOTE — Progress Notes (Signed)
Subjective: Patient s/p 3 doses of Solumedrol.  On Oxybutynin.  Post void residyals greatly improved.    Objective: Current vital signs: BP 133/69 (BP Location: Right Arm)   Pulse (!) 50   Temp 98.2 F (36.8 C) (Oral)   Resp 18   Ht 5\' 11"  (1.803 m)   Wt 105.7 kg (233 lb)   SpO2 100%   BMI 32.50 kg/m  Vital signs in last 24 hours: Temp:  [98.2 F (36.8 C)-98.7 F (37.1 C)] 98.2 F (36.8 C) (11/20 0737) Pulse Rate:  [44-65] 50 (11/20 0737) Resp:  [18-20] 18 (11/20 0737) BP: (129-151)/(45-69) 133/69 (11/20 0737) SpO2:  [96 %-100 %] 100 % (11/20 0737)  Intake/Output from previous day: 11/19 0701 - 11/20 0700 In: 778 [P.O.:720; IV Piggyback:58] Out: -  Intake/Output this shift: Total I/O In: 240 [P.O.:240] Out: -  Nutritional status: Diet regular Room service appropriate? Yes; Fluid consistency: Thin  Neurologic Exam: Mental Status: Alert, oriented, thought content appropriate. Speech fluent without evidence of aphasia. Able to follow 3 step commands without difficulty. Cranial Nerves: II: Discs flat bilaterally; Visual fields grossly normal, pupils equal, round, reactive to light and accommodation III,IV, VI: ptosis not present, extra-ocular motions intact bilaterally V,VII: smile symmetric, facial light touch sensation normal bilaterally VIII: hearing normal bilaterally IX,X: gag reflex present XI: bilateral shoulder shrug XII: midline tongue extension Motor: Right :Upper extremity 5/5Left: Upper extremity 5/5 Lower extremity 1-2/5Lower extremity 3/5 Tone and bulk:normal tone throughout; no atrophy noted Sensory: Pinprick and light touch decreased to the groin  Lab Results: Basic Metabolic Panel:  Recent Labs Lab 03/24/16 0903 03/25/16 0418 03/26/16 0432 03/27/16 0343  NA 137 140 143 143  K 3.8 3.7 3.8 3.6  CL 107 111 113* 112*  CO2 24 25 25 28    GLUCOSE 106* 139* 129* 130*  BUN 13 22* 34* 35*  CREATININE 0.66 0.68 0.70 0.67  CALCIUM 8.9 8.9 9.0 8.6*    Liver Function Tests:  Recent Labs Lab 03/24/16 0903  AST 25  ALT 18  ALKPHOS 127*  BILITOT 1.1  PROT 7.6  ALBUMIN 3.7   No results for input(s): LIPASE, AMYLASE in the last 168 hours. No results for input(s): AMMONIA in the last 168 hours.  CBC:  Recent Labs Lab 03/24/16 0903  WBC 11.9*  NEUTROABS 8.9*  HGB 12.7  HCT 36.8  MCV 78.5*  PLT 204    Cardiac Enzymes:  Recent Labs Lab 03/24/16 0903  TROPONINI <0.03    Lipid Panel: No results for input(s): CHOL, TRIG, HDL, CHOLHDL, VLDL, LDLCALC in the last 168 hours.  CBG: No results for input(s): GLUCAP in the last 168 hours.  Microbiology: Results for orders placed or performed during the hospital encounter of 07/14/15  Rapid Influenza A&B Antigens ([facility] only)     Status: None   Collection Time: 07/14/15 10:48 AM  Result Value Ref Range Status   Influenza A ([facility]) NEGATIVE NEGATIVE Final   Influenza B ([facility]) NEGATIVE NEGATIVE Final    Coagulation Studies: No results for input(s): LABPROT, INR in the last 72 hours.  Imaging: No results found.  Medications:  I have reviewed the patient's current medications. Scheduled: . baclofen  20 mg Oral TID  . calcium-vitamin D  1 tablet Oral Daily  . enoxaparin (LOVENOX) injection  40 mg Subcutaneous Q24H  . oxybutynin  5 mg Oral BID    Assessment/Plan: Patient reports the LLE is at baseline.  Right leg has been impaired since injury and remains swollen  and painful.  At this point this is likely there biggest impairment to her ambulation.    Recommendations: 1.  Continue therapy 2.  Continue Ditropan 3.  May stop Solumedrol at this time   LOS: 3 days   Thana FarrLeslie Dwyane Dupree, MD Neurology [phone] 03/27/2016  11:54 AM

## 2016-04-22 LAB — HEPATIC FUNCTION PANEL
ALK PHOS: 181 IU/L — AB (ref 39–117)
ALT: 12 IU/L (ref 0–32)
AST: 15 IU/L (ref 0–40)
Albumin: 4.3 g/dL (ref 3.5–5.5)
BILIRUBIN TOTAL: 0.5 mg/dL (ref 0.0–1.2)
BILIRUBIN, DIRECT: 0.13 mg/dL (ref 0.00–0.40)
Total Protein: 7.3 g/dL (ref 6.0–8.5)

## 2016-04-24 LAB — ALKALINE PHOSPHATASE, ISOENZYMES
ALK PHOS: 178 IU/L — AB (ref 39–117)
BONE FRACTION: 35 % (ref 14–68)
INTESTINAL FRAC.: 1 % (ref 0–18)
LIVER FRACTION: 64 % (ref 18–85)

## 2016-05-15 ENCOUNTER — Telehealth: Payer: Self-pay | Admitting: Gastroenterology

## 2016-05-15 NOTE — Telephone Encounter (Signed)
Results

## 2016-05-18 ENCOUNTER — Other Ambulatory Visit: Payer: Self-pay

## 2016-05-18 DIAGNOSIS — R748 Abnormal levels of other serum enzymes: Secondary | ICD-10-CM

## 2016-05-18 NOTE — Telephone Encounter (Signed)
Pt notified of lab results and the need for a repeat lab in 3 months.

## 2016-05-18 NOTE — Telephone Encounter (Signed)
-----   Message from Midge Minium, MD sent at 05/15/2016 12:26 PM EST ----- Let the patient know that her liver enzymes show her alkaline phosphatase to be elevated and mainly from her liver. The rest of the liver enzymes remain normal. I would continue to keep an eye on this and recheck her labs in 3 months. In months ago and as far back as 4 years ago her alkaline phosphatase was normal meaning that something has changed since then and this may be due to a medication. Prior to recommending a liver biopsy or any invasive procedures, I would like to give it some more time to come back to normal. Recheck her labs in 3 months

## 2016-05-23 DIAGNOSIS — G35 Multiple sclerosis: Secondary | ICD-10-CM | POA: Diagnosis not present

## 2016-05-23 DIAGNOSIS — G8929 Other chronic pain: Secondary | ICD-10-CM | POA: Diagnosis not present

## 2016-05-23 DIAGNOSIS — M25512 Pain in left shoulder: Secondary | ICD-10-CM | POA: Diagnosis not present

## 2016-05-23 DIAGNOSIS — N319 Neuromuscular dysfunction of bladder, unspecified: Secondary | ICD-10-CM | POA: Diagnosis not present

## 2016-05-23 DIAGNOSIS — R252 Cramp and spasm: Secondary | ICD-10-CM | POA: Diagnosis not present

## 2016-05-23 DIAGNOSIS — E669 Obesity, unspecified: Secondary | ICD-10-CM | POA: Diagnosis not present

## 2016-05-23 DIAGNOSIS — G479 Sleep disorder, unspecified: Secondary | ICD-10-CM | POA: Diagnosis not present

## 2016-06-01 DIAGNOSIS — R262 Difficulty in walking, not elsewhere classified: Secondary | ICD-10-CM | POA: Diagnosis not present

## 2016-06-01 DIAGNOSIS — G35 Multiple sclerosis: Secondary | ICD-10-CM | POA: Diagnosis not present

## 2016-07-14 DIAGNOSIS — Z111 Encounter for screening for respiratory tuberculosis: Secondary | ICD-10-CM | POA: Diagnosis not present

## 2016-07-14 DIAGNOSIS — Z01812 Encounter for preprocedural laboratory examination: Secondary | ICD-10-CM | POA: Diagnosis not present

## 2016-07-14 DIAGNOSIS — G35 Multiple sclerosis: Secondary | ICD-10-CM | POA: Diagnosis not present

## 2016-07-18 DIAGNOSIS — Z111 Encounter for screening for respiratory tuberculosis: Secondary | ICD-10-CM | POA: Diagnosis not present

## 2016-07-20 ENCOUNTER — Other Ambulatory Visit: Payer: Self-pay

## 2016-07-20 DIAGNOSIS — R748 Abnormal levels of other serum enzymes: Secondary | ICD-10-CM

## 2016-07-21 LAB — HEPATIC FUNCTION PANEL
ALBUMIN: 4.4 g/dL (ref 3.5–5.5)
ALT: 16 IU/L (ref 0–32)
AST: 16 IU/L (ref 0–40)
Alkaline Phosphatase: 136 IU/L — ABNORMAL HIGH (ref 39–117)
BILIRUBIN TOTAL: 0.4 mg/dL (ref 0.0–1.2)
BILIRUBIN, DIRECT: 0.13 mg/dL (ref 0.00–0.40)
TOTAL PROTEIN: 7.3 g/dL (ref 6.0–8.5)

## 2016-08-01 ENCOUNTER — Telehealth: Payer: Self-pay | Admitting: Gastroenterology

## 2016-08-01 NOTE — Telephone Encounter (Signed)
Dr. Daisy Blossom nurse Toni Amend) called and he is changing her MS medication to Rituximab infusion q 6 mths. Want to ok this medication with you due to her liver enzymes. Please call at [phone]

## 2016-08-02 NOTE — Telephone Encounter (Signed)
The changes medication should be fine. Just have her labs checked again for her liver enzymes in 1 month

## 2016-08-02 NOTE — Telephone Encounter (Signed)
Message left with receptionist that starting new medication for her MS will be fine. Pt is to have her LFT's repeated 1 month after starting treatment.

## 2016-08-21 ENCOUNTER — Ambulatory Visit: Payer: Medicare Other

## 2016-08-21 ENCOUNTER — Ambulatory Visit: Payer: Medicare Other | Admitting: Oncology

## 2016-08-28 ENCOUNTER — Ambulatory Visit: Payer: Medicare Other | Admitting: Oncology

## 2016-09-05 NOTE — Progress Notes (Signed)
[facility]  Clinic day:  09/06/2016  Chief Complaint: Lisa Williams is a 56 y.o. female with multiple sclerosis who is referred by Dr. Theora Master for initiation of Rituxan.  HPI:  The patient was diagnosed with multiple sclerosis in 1995. She presented dragging her right foot. In terms progressed and she cannot walk up. She underwent testing.  She was diagnosed with multiple sclerosis by Dr. Grandville Silos. She was initially treated with Copaxone injections at home.  She has been followed by Dr. [name] for the last 2 years up. Decision was made to begin treatment again as she has been hospitalized 3 times in the past year with multiple sclerosis flares.  She notes difficulty walking and numbness in her right foot.  She has had several falls. In 03/2016 she fell and fractured her ankle.  CBC on 07/14/2016 included a hematocrit of 40.3, hemoglobin 13, MCV 82, platelets 216,000, white count 6400 with an ANC of 3700.  Pregnancy test was negative.  Hepatitis B core antibody total was negative on 07/14/2016.  Hepatitis B surface antibody quantitative was negative on 07/14/2016.  Hepatitis testing on 12/16/2015 was negative for the following: Hepatitis B surface antigen, hepatitis Be antigen, hepatitis B core IgM antibody, hepatitis B core total antibody, hepatitis B E antibody, hepatitis B surface antibody, and hepatitis C antibody.  Symptomatically, she notes difficulty walking. She denies any issues with infections.   Past Medical History:  Diagnosis Date  . Back pain   . Calculus of kidney 12/09/2013  . Chronic left shoulder pain 07/19/2015  . Galactorrhea 11/26/2014   Chronic   . Hereditary and idiopathic neuropathy 08/19/2013  . HPV test positive   . Hypercholesteremia 08/19/2013  . MS (multiple sclerosis) (HCC)   . Muscle spasticity 05/21/2014  . Status post laparoscopic supracervical hysterectomy 11/26/2014  . Tobacco user 11/26/2014    Past  Surgical History:  Procedure Laterality Date  . ABDOMINAL HYSTERECTOMY    . bilateral tubal ligation  1996  . KNEE SURGERY     right  . LAPAROSCOPIC SUPRACERVICAL HYSTERECTOMY  08/05/2013    Family History  Problem Relation Age of Onset  . Sickle cell trait Sister   . Supraventricular tachycardia Sister   . Diabetes Maternal Grandmother   . Liver cancer Maternal Grandmother   . Diabetes Maternal Aunt   . Breast cancer Paternal Grandmother   . Ovarian cancer Paternal Grandmother   . Hypertension Father   . Hypertension Sister     Social History:  reports that she has quit smoking. Her smoking use included Cigarettes. She smoked 0.50 packs per day. She has never used smokeless tobacco. She reports that she does not drink alcohol or use drugs.  She lives in [place].  She is disabled secondary to her multiple sclerosis.  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  The patient is accompanied by her mother and sister today.  Allergies:  Allergies  Allergen Reactions  . Amlodipine Other (See Comments)    edema  . Povidone Iodine Rash    Current Medications: Current Outpatient Prescriptions  Medication Sig Dispense Refill  . baclofen (LIORESAL) 20 MG tablet Take 20 mg by mouth 3 (three) times daily.     . Calcium Carb-Cholecalciferol (CALCIUM 600+D) 600-800 MG-UNIT TABS Take 1 tablet by mouth daily.    . meloxicam (MOBIC) 15 MG tablet Take 1 tablet (15 mg total) by mouth 2 (two) times daily as needed for  pain.  2  . oxybutynin (DITROPAN) 5 MG tablet Take 1 tablet (5 mg total) by mouth 2 (two) times daily.    Marland Kitchen tiZANidine (ZANAFLEX) 4 MG tablet Take 4 mg by mouth every 6 (six) hours as needed.    Marland Kitchen HYDROcodone-acetaminophen (NORCO/VICODIN) 5-325 MG tablet Take 1 tablet by mouth every 6 (six) hours as needed for severe pain. (Patient not taking: Reported on 09/06/2016) 12 tablet 0   No current facility-administered medications for this visit.     Facility-Administered Medications Ordered in Other Visits  Medication Dose Route Frequency Provider Last Rate Last Dose  . acetaminophen (TYLENOL) tablet 1,000 mg  1,000 mg Oral Once Rosey Bath, MD      . diphenhydrAMINE (BENADRYL) injection 50 mg  50 mg Intravenous Once Rosey Bath, MD      . methylPREDNISolone sodium succinate (SOLU-MEDROL) 125 mg/2 mL injection 100 mg  100 mg Intravenous Once Rosey Bath, MD      . riTUXimab (RITUXAN) 1,000 mg in sodium chloride 0.9 % 250 mL (2.8571 mg/mL) chemo infusion  1,000 mg Intravenous Once Rosey Bath, MD        Review of Systems:  GENERAL:  Feels "ok".  No fevers, sweats or weight loss. PERFORMANCE STATUS (ECOG):  3 HEENT:  No visual changes, runny nose, sore throat, mouth sores or tenderness. Lungs: No shortness of breath or cough.  No hemoptysis. Cardiac:  No chest pain, palpitations, orthopnea, or PND. GI:  No nausea, vomiting, diarrhea, constipation, melena or hematochezia. GU:  No urgency, frequency, dysuria, or hematuria. Musculoskeletal:  No back pain.  No joint pain.  No muscle tenderness. Extremities:  No pain or swelling. Skin:  No rashes or skin changes. Neuro:  Difficulty walking.  Numb right foot.  No headache or visual changes. Endocrine:  No diabetes, thyroid issues, hot flashes or night sweats. Psych:  No mood changes, depression or anxiety. Pain:  No focal pain. Review of systems:  All other systems reviewed and found to be negative.  Physical Exam: Blood pressure (!) 156/81, pulse 68, temperature 97 F (36.1 C), temperature source Tympanic, resp. rate 18, height 5\' 11"  (1.803 m), weight 243 lb 5 oz (110.4 kg). GENERAL:  Well developed, well nourished, woman sitting comfortably in the exam room in no acute distress.  She is unable to sit on the exam table despite 2 person assist.  She is examined in her chair.  She has a rolling walker at her side. MENTAL STATUS:  Alert and oriented to person,  place and time. HEAD:  Short brown styled hair.  Normocephalic, atraumatic, face symmetric, no Cushingoid features. EYES:  Brown eyes.  Pupils equal round and reactive to light and accomodation.  No conjunctivitis or scleral icterus. ENT:  Oropharynx clear without lesion.  Tongue normal. Mucous membranes moist.  RESPIRATORY:  Clear to auscultation without rales, wheezes or rhonchi. CARDIOVASCULAR:  Regular rate and rhythm without murmur, rub or gallop. ABDOMEN:  Soft, non-tender, with active bowel sounds, and no hepatosplenomegaly.  No masses. SKIN:  No rashes, ulcers or lesions. EXTREMITIES: No edema, no skin discoloration or tenderness.  No palpable cords. LYMPH NODES: No palpable cervical, supraclavicular, axillary or inguinal adenopathy  NEUROLOGICAL:  Alert & oriented, cranial nerves II-XII intact; motor strength 5/5 upper extremity.  Unable to lift legs against gravity.; sensation intact; finger to nose and RAM normal.  Slow metered wide based gait with a rolling walker.  Upper extremity reflexes 3+.  PSYCH:  Appropriate.  No visits with results within 3 Day(s) from this visit.  Latest known visit with results is:  Orders Only on 05/18/2016  Component Date Value Ref Range Status  . Total Protein 07/20/2016 7.3  6.0 - 8.5 g/dL Final  . Albumin 16/02/9603 4.4  3.5 - 5.5 g/dL Final  . Bilirubin Total 07/20/2016 0.4  0.0 - 1.2 mg/dL Final  . Bilirubin, Direct 07/20/2016 0.13  0.00 - 0.40 mg/dL Final  . Alkaline Phosphatase 07/20/2016 136* 39 - 117 IU/L Final  . AST 07/20/2016 16  0 - 40 IU/L Final  . ALT 07/20/2016 16  0 - 32 IU/L Final    Assessment:  Lisa Williams is a 56 y.o. female with multiple sclerosis diagnosed in 1995.  She was previously treated with Copaxone. . Hepatitis B core antibody total and hepatitis B surface antibody quantitative was negative on 07/14/2016.  Hepatitis testing on 12/16/2015 was negative for the following: Hepatitis B surface antigen,  hepatitis Be antigen, hepatitis B core IgM antibody, hepatitis B core total antibody, hepatitis B E antibody, hepatitis B surface antibody, and hepatitis C antibody.  Symptomatically, she has lower extremity weakness.  Plan: 1.  Discuss plan for Rituxan per Dr. [name] to treat multiple sclerosis.  Patient will receive Rituxan today with repeat dose in 2 weeks then every 6 months.  Discussed premedications.  Potential side effects reviewed in detail including allergic reaction, reactivation of hepatitis if positive (patient negative), and rare cases of PML.  Patient consented to treatment.  Multiple questions asked and answered. 2.  Begin Rituxan 1000 mg IV then repeat in 2 weeks.  Anticipate treatment every 6 months after 2nd dose.  Premedications:  Acetaminophen 1000 mg po, Benadryl 50 mg po, and Solumedrol 100 mg IV.  Treatment per Dr. [name]. 3.  RTC in 2 weeks for Rituxan infusion #2. 4.  RTC in 6 months after 2nd infusion for MD assessment and Rituxan.   Rosey Bath, MD  09/06/2016, 9:23 AM

## 2016-09-06 ENCOUNTER — Inpatient Hospital Stay: Payer: PPO

## 2016-09-06 ENCOUNTER — Inpatient Hospital Stay: Payer: PPO | Attending: Oncology | Admitting: Hematology and Oncology

## 2016-09-06 ENCOUNTER — Encounter: Payer: Self-pay | Admitting: Hematology and Oncology

## 2016-09-06 VITALS — BP 156/81 | HR 68 | Temp 97.0°F | Resp 18 | Ht 71.0 in | Wt 243.3 lb

## 2016-09-06 VITALS — BP 149/81 | HR 71 | Temp 96.4°F | Resp 18

## 2016-09-06 DIAGNOSIS — E7800 Pure hypercholesterolemia, unspecified: Secondary | ICD-10-CM | POA: Diagnosis not present

## 2016-09-06 DIAGNOSIS — M549 Dorsalgia, unspecified: Secondary | ICD-10-CM | POA: Insufficient documentation

## 2016-09-06 DIAGNOSIS — R531 Weakness: Secondary | ICD-10-CM | POA: Diagnosis not present

## 2016-09-06 DIAGNOSIS — Z87891 Personal history of nicotine dependence: Secondary | ICD-10-CM | POA: Diagnosis not present

## 2016-09-06 DIAGNOSIS — G35 Multiple sclerosis: Secondary | ICD-10-CM | POA: Insufficient documentation

## 2016-09-06 DIAGNOSIS — Z8041 Family history of malignant neoplasm of ovary: Secondary | ICD-10-CM | POA: Insufficient documentation

## 2016-09-06 DIAGNOSIS — Z800 Family history of malignant neoplasm of digestive organs: Secondary | ICD-10-CM | POA: Diagnosis not present

## 2016-09-06 DIAGNOSIS — Z803 Family history of malignant neoplasm of breast: Secondary | ICD-10-CM | POA: Insufficient documentation

## 2016-09-06 DIAGNOSIS — G8929 Other chronic pain: Secondary | ICD-10-CM

## 2016-09-06 DIAGNOSIS — Z87442 Personal history of urinary calculi: Secondary | ICD-10-CM | POA: Insufficient documentation

## 2016-09-06 DIAGNOSIS — Z9181 History of falling: Secondary | ICD-10-CM | POA: Insufficient documentation

## 2016-09-06 DIAGNOSIS — R200 Anesthesia of skin: Secondary | ICD-10-CM | POA: Diagnosis not present

## 2016-09-06 DIAGNOSIS — M25512 Pain in left shoulder: Secondary | ICD-10-CM | POA: Insufficient documentation

## 2016-09-06 DIAGNOSIS — Z79899 Other long term (current) drug therapy: Secondary | ICD-10-CM | POA: Insufficient documentation

## 2016-09-06 MED ORDER — DIPHENHYDRAMINE HCL 50 MG/ML IJ SOLN
50.0000 mg | Freq: Once | INTRAMUSCULAR | Status: AC
  Administered 2016-09-06: 50 mg via INTRAVENOUS
  Filled 2016-09-06: qty 1

## 2016-09-06 MED ORDER — SODIUM CHLORIDE 0.9 % IV SOLN
1000.0000 mg | Freq: Once | INTRAVENOUS | Status: AC
  Administered 2016-09-06: 1000 mg via INTRAVENOUS
  Filled 2016-09-06: qty 100

## 2016-09-06 MED ORDER — SODIUM CHLORIDE 0.9 % IV SOLN
Freq: Once | INTRAVENOUS | Status: AC
  Administered 2016-09-06: 11:00:00 via INTRAVENOUS
  Filled 2016-09-06: qty 1000

## 2016-09-06 MED ORDER — ACETAMINOPHEN 500 MG PO TABS
1000.0000 mg | ORAL_TABLET | Freq: Once | ORAL | Status: AC
  Administered 2016-09-06: 1000 mg via ORAL
  Filled 2016-09-06: qty 2

## 2016-09-06 MED ORDER — METHYLPREDNISOLONE SODIUM SUCC 125 MG IJ SOLR
100.0000 mg | Freq: Once | INTRAMUSCULAR | Status: AC
  Administered 2016-09-06: 100 mg via INTRAVENOUS
  Filled 2016-09-06: qty 2

## 2016-09-06 NOTE — Patient Instructions (Signed)
Rituximab injection  What is this medicine?  RITUXIMAB (ri TUX i mab) is a monoclonal antibody. It is used to treat certain types of cancer like non-Hodgkin lymphoma and chronic lymphocytic leukemia. It is also used to treat rheumatoid arthritis, granulomatosis with polyangiitis (or Wegener's granulomatosis), and microscopic polyangiitis.  This medicine may be used for other purposes; ask your health care provider or pharmacist if you have questions.  COMMON BRAND NAME(S): Rituxan  What should I tell my health care provider before I take this medicine?  They need to know if you have any of these conditions:  -heart disease  -infection (especially a virus infection such as hepatitis B, chickenpox, cold sores, or herpes)  -immune system problems  -irregular heartbeat  -kidney disease  -lung or breathing disease, like asthma  -recently received or scheduled to receive a vaccine  -an unusual or allergic reaction to rituximab, mouse proteins, other medicines, foods, dyes, or preservatives  -pregnant or trying to get pregnant  -breast-feeding  How should I use this medicine?  This medicine is for infusion into a vein. It is administered in a hospital or clinic by a specially trained health care professional.  A special MedGuide will be given to you by the pharmacist with each prescription and refill. Be sure to read this information carefully each time.  Talk to your pediatrician regarding the use of this medicine in children. This medicine is not approved for use in children.  Overdosage: If you think you have taken too much of this medicine contact a poison control center or emergency room at once.  NOTE: This medicine is only for you. Do not share this medicine with others.  What if I miss a dose?  It is important not to miss a dose. Call your doctor or health care professional if you are unable to keep an appointment.  What may interact with this medicine?  -cisplatin  -other medicines for arthritis like disease  modifying antirheumatic drugs or tumor necrosis factor inhibitors  -live virus vaccines  This list may not describe all possible interactions. Give your health care provider a list of all the medicines, herbs, non-prescription drugs, or dietary supplements you use. Also tell them if you smoke, drink alcohol, or use illegal drugs. Some items may interact with your medicine.  What should I watch for while using this medicine?  Your condition will be monitored carefully while you are receiving this medicine. You may need blood work done while you are taking this medicine.  This medicine can cause serious allergic reactions. To reduce your risk you may need to take medicine before treatment with this medicine. Take your medicine as directed.  In some patients, this medicine may cause a serious brain infection that may cause death. If you have any problems seeing, thinking, speaking, walking, or standing, tell your doctor right away. If you cannot reach your doctor, urgently seek other source of medical care.  Call your doctor or health care professional for advice if you get a fever, chills or sore throat, or other symptoms of a cold or flu. Do not treat yourself. This drug decreases your body's ability to fight infections. Try to avoid being around people who are sick.  Do not become pregnant while taking this medicine or for 12 months after stopping it. Women should inform their doctor if they wish to become pregnant or think they might be pregnant. There is a potential for serious side effects to an unborn child. Talk to your health   care professional or pharmacist for more information.  What side effects may I notice from receiving this medicine?  Side effects that you should report to your doctor or health care professional as soon as possible:  -breathing problems  -chest pain  -dizziness or feeling faint  -fast, irregular heartbeat  -low blood counts - this medicine may decrease the number of white blood cells,  red blood cells and platelets. You may be at increased risk for infections and bleeding.  -mouth sores  -redness, blistering, peeling or loosening of the skin, including inside the mouth (this can be added for any serious or exfoliative rash that could lead to hospitalization)  -signs of infection - fever or chills, cough, sore throat, pain or difficulty passing urine  -signs and symptoms of kidney injury like trouble passing urine or change in the amount of urine  -signs and symptoms of liver injury like dark yellow or brown urine; general ill feeling or flu-like symptoms; light-colored stools; loss of appetite; nausea; right upper belly pain; unusually weak or tired; yellowing of the eyes or skin  -stomach pain  -vomiting  Side effects that usually do not require medical attention (report to your doctor or health care professional if they continue or are bothersome):  -headache  -joint pain  -muscle cramps or muscle pain  This list may not describe all possible side effects. Call your doctor for medical advice about side effects. You may report side effects to FDA at 1-800-FDA-1088.  Where should I keep my medicine?  This drug is given in a hospital or clinic and will not be stored at home.  NOTE: This sheet is a summary. It may not cover all possible information. If you have questions about this medicine, talk to your doctor, pharmacist, or health care provider.   2018 Elsevier/Gold Standard (2015-12-01 15:28:09)

## 2016-09-06 NOTE — Progress Notes (Signed)
Patient here for her initial Rituxan treatment for MS.  Patient signed consent and copy made for chart.  Patient tolerated treatment well.  Only one complaint of perineal itching noted which was relieved with a cool water wash done in the bathroom.  Teaching materials regarding Rituxan for MS printed and given to patient.  Blood pressure elevated throughout treatment.  Patient advised to take blood pressure two to three times a day at the same time every day for a week and to call primary care physician to report findings.  Patient also instructed to follow a low salt diet supplemented with fresh fruits and vegetables, to increase water consumption and to exercise as much as possible.  Chair exercises reviewed with family members.

## 2016-09-06 NOTE — Progress Notes (Signed)
Patient here today as new evaluation regarding MS.  Referred by Dr. [name].  Patient accompanied by mother and sister for today's visit.

## 2016-09-11 ENCOUNTER — Ambulatory Visit
Admission: RE | Admit: 2016-09-11 | Discharge: 2016-09-11 | Disposition: A | Discharge status: Home / Self Care | Payer: PPO | Source: Ambulatory Visit | Attending: Internal Medicine | Admitting: Internal Medicine

## 2016-09-11 DIAGNOSIS — Z1231 Encounter for screening mammogram for malignant neoplasm of breast: Secondary | ICD-10-CM | POA: Insufficient documentation

## 2016-09-12 DIAGNOSIS — E669 Obesity, unspecified: Secondary | ICD-10-CM | POA: Diagnosis not present

## 2016-09-12 DIAGNOSIS — G35 Multiple sclerosis: Secondary | ICD-10-CM | POA: Diagnosis not present

## 2016-09-12 DIAGNOSIS — I10 Essential (primary) hypertension: Secondary | ICD-10-CM | POA: Diagnosis not present

## 2016-09-15 DIAGNOSIS — I10 Essential (primary) hypertension: Secondary | ICD-10-CM | POA: Diagnosis not present

## 2016-09-15 DIAGNOSIS — G35 Multiple sclerosis: Secondary | ICD-10-CM | POA: Diagnosis not present

## 2016-09-15 DIAGNOSIS — E669 Obesity, unspecified: Secondary | ICD-10-CM | POA: Diagnosis not present

## 2016-09-19 MED ORDER — ACETAMINOPHEN 500 MG PO TABS
1000.0000 mg | ORAL_TABLET | Freq: Once | ORAL | Status: AC
  Administered 2016-09-20: 1000 mg via ORAL
  Filled 2016-09-19: qty 2

## 2016-09-19 MED ORDER — SODIUM CHLORIDE 0.9 % IV SOLN
1000.0000 mg | Freq: Once | INTRAVENOUS | Status: AC
  Administered 2016-09-20: 1000 mg via INTRAVENOUS
  Filled 2016-09-19: qty 100

## 2016-09-19 MED ORDER — METHYLPREDNISOLONE SODIUM SUCC 125 MG IJ SOLR
100.0000 mg | Freq: Once | INTRAMUSCULAR | Status: AC
  Administered 2016-09-20: 125 mg via INTRAVENOUS
  Filled 2016-09-19: qty 2

## 2016-09-19 MED ORDER — DIPHENHYDRAMINE HCL 50 MG/ML IJ SOLN
50.0000 mg | Freq: Once | INTRAMUSCULAR | Status: AC
  Administered 2016-09-20: 50 mg via INTRAVENOUS
  Filled 2016-09-19: qty 1

## 2016-09-20 ENCOUNTER — Inpatient Hospital Stay: Payer: PPO

## 2016-09-20 VITALS — BP 109/62 | HR 52 | Resp 18

## 2016-09-20 DIAGNOSIS — G35 Multiple sclerosis: Secondary | ICD-10-CM | POA: Diagnosis not present

## 2016-09-20 MED ORDER — SODIUM CHLORIDE 0.9 % IV SOLN
Freq: Once | INTRAVENOUS | Status: AC
Start: 2016-09-20 — End: 2016-09-20
  Administered 2016-09-20: 09:00:00 via INTRAVENOUS
  Filled 2016-09-20: qty 1000

## 2016-09-20 NOTE — Patient Instructions (Signed)
Rituximab injection  What is this medicine?  RITUXIMAB (ri TUX i mab) is a monoclonal antibody. It is used to treat certain types of cancer like non-Hodgkin lymphoma and chronic lymphocytic leukemia. It is also used to treat rheumatoid arthritis, granulomatosis with polyangiitis (or Wegener's granulomatosis), and microscopic polyangiitis.  This medicine may be used for other purposes; ask your health care provider or pharmacist if you have questions.  COMMON BRAND NAME(S): Rituxan  What should I tell my health care provider before I take this medicine?  They need to know if you have any of these conditions:  -heart disease  -infection (especially a virus infection such as hepatitis B, chickenpox, cold sores, or herpes)  -immune system problems  -irregular heartbeat  -kidney disease  -lung or breathing disease, like asthma  -recently received or scheduled to receive a vaccine  -an unusual or allergic reaction to rituximab, mouse proteins, other medicines, foods, dyes, or preservatives  -pregnant or trying to get pregnant  -breast-feeding  How should I use this medicine?  This medicine is for infusion into a vein. It is administered in a hospital or clinic by a specially trained health care professional.  A special MedGuide will be given to you by the pharmacist with each prescription and refill. Be sure to read this information carefully each time.  Talk to your pediatrician regarding the use of this medicine in children. This medicine is not approved for use in children.  Overdosage: If you think you have taken too much of this medicine contact a poison control center or emergency room at once.  NOTE: This medicine is only for you. Do not share this medicine with others.  What if I miss a dose?  It is important not to miss a dose. Call your doctor or health care professional if you are unable to keep an appointment.  What may interact with this medicine?  -cisplatin  -other medicines for arthritis like disease  modifying antirheumatic drugs or tumor necrosis factor inhibitors  -live virus vaccines  This list may not describe all possible interactions. Give your health care provider a list of all the medicines, herbs, non-prescription drugs, or dietary supplements you use. Also tell them if you smoke, drink alcohol, or use illegal drugs. Some items may interact with your medicine.  What should I watch for while using this medicine?  Your condition will be monitored carefully while you are receiving this medicine. You may need blood work done while you are taking this medicine.  This medicine can cause serious allergic reactions. To reduce your risk you may need to take medicine before treatment with this medicine. Take your medicine as directed.  In some patients, this medicine may cause a serious brain infection that may cause death. If you have any problems seeing, thinking, speaking, walking, or standing, tell your doctor right away. If you cannot reach your doctor, urgently seek other source of medical care.  Call your doctor or health care professional for advice if you get a fever, chills or sore throat, or other symptoms of a cold or flu. Do not treat yourself. This drug decreases your body's ability to fight infections. Try to avoid being around people who are sick.  Do not become pregnant while taking this medicine or for 12 months after stopping it. Women should inform their doctor if they wish to become pregnant or think they might be pregnant. There is a potential for serious side effects to an unborn child. Talk to your health   care professional or pharmacist for more information.  What side effects may I notice from receiving this medicine?  Side effects that you should report to your doctor or health care professional as soon as possible:  -breathing problems  -chest pain  -dizziness or feeling faint  -fast, irregular heartbeat  -low blood counts - this medicine may decrease the number of white blood cells,  red blood cells and platelets. You may be at increased risk for infections and bleeding.  -mouth sores  -redness, blistering, peeling or loosening of the skin, including inside the mouth (this can be added for any serious or exfoliative rash that could lead to hospitalization)  -signs of infection - fever or chills, cough, sore throat, pain or difficulty passing urine  -signs and symptoms of kidney injury like trouble passing urine or change in the amount of urine  -signs and symptoms of liver injury like dark yellow or brown urine; general ill feeling or flu-like symptoms; light-colored stools; loss of appetite; nausea; right upper belly pain; unusually weak or tired; yellowing of the eyes or skin  -stomach pain  -vomiting  Side effects that usually do not require medical attention (report to your doctor or health care professional if they continue or are bothersome):  -headache  -joint pain  -muscle cramps or muscle pain  This list may not describe all possible side effects. Call your doctor for medical advice about side effects. You may report side effects to FDA at 1-800-FDA-1088.  Where should I keep my medicine?  This drug is given in a hospital or clinic and will not be stored at home.  NOTE: This sheet is a summary. It may not cover all possible information. If you have questions about this medicine, talk to your doctor, pharmacist, or health care provider.   2018 Elsevier/Gold Standard (2015-12-01 15:28:09)

## 2016-10-13 DIAGNOSIS — G35 Multiple sclerosis: Secondary | ICD-10-CM | POA: Diagnosis not present

## 2016-10-13 DIAGNOSIS — R718 Other abnormality of red blood cells: Secondary | ICD-10-CM | POA: Diagnosis not present

## 2016-10-13 DIAGNOSIS — I10 Essential (primary) hypertension: Secondary | ICD-10-CM | POA: Diagnosis not present

## 2016-10-27 DIAGNOSIS — Z79899 Other long term (current) drug therapy: Secondary | ICD-10-CM | POA: Diagnosis not present

## 2016-10-27 DIAGNOSIS — G35 Multiple sclerosis: Secondary | ICD-10-CM | POA: Diagnosis not present

## 2016-11-07 DIAGNOSIS — M79675 Pain in left toe(s): Secondary | ICD-10-CM | POA: Diagnosis not present

## 2016-11-07 DIAGNOSIS — S6992XA Unspecified injury of left wrist, hand and finger(s), initial encounter: Secondary | ICD-10-CM | POA: Diagnosis not present

## 2016-11-21 ENCOUNTER — Telehealth: Payer: Self-pay | Admitting: Gastroenterology

## 2016-11-21 DIAGNOSIS — E669 Obesity, unspecified: Secondary | ICD-10-CM | POA: Diagnosis not present

## 2016-11-21 DIAGNOSIS — G479 Sleep disorder, unspecified: Secondary | ICD-10-CM | POA: Diagnosis not present

## 2016-11-21 DIAGNOSIS — M25512 Pain in left shoulder: Secondary | ICD-10-CM | POA: Diagnosis not present

## 2016-11-21 DIAGNOSIS — G35 Multiple sclerosis: Secondary | ICD-10-CM | POA: Diagnosis not present

## 2016-11-21 DIAGNOSIS — G8929 Other chronic pain: Secondary | ICD-10-CM | POA: Diagnosis not present

## 2016-11-21 DIAGNOSIS — Z720 Tobacco use: Secondary | ICD-10-CM | POA: Diagnosis not present

## 2016-11-21 DIAGNOSIS — R252 Cramp and spasm: Secondary | ICD-10-CM | POA: Diagnosis not present

## 2016-11-21 DIAGNOSIS — M545 Low back pain: Secondary | ICD-10-CM | POA: Diagnosis not present

## 2016-11-21 NOTE — Telephone Encounter (Signed)
Please call patient regading lab work.

## 2016-11-27 NOTE — Progress Notes (Signed)
Patient ID: Lisa Williams, female   DOB: Apr 13, 1961, 56 y.o.   MRN: [id number] ANNUAL PREVENTATIVE CARE GYN  ENCOUNTER NOTE  Subjective:       Lisa Williams is a 56 y.o. G0P0000 female here for a routine annual gynecologic exam.  Current complaints: 1.  ANNUAL 2.  History of positive HPV on Pap  Last Pap smear was negative/negative Patient is not sexually active. Patient is menopausal without any abnormal uterine bleeding. Mammogram is already been done this year and was normal. Patient has had a difficult year with 3 hospitalizations from her MS. She currently is getting new MS infusions for her MS symptoms which is helping.   Gynecologic History No LMP recorded. Patient has had a hysterectomy. Status post LSH Contraception: NA Last Pap: 05/2015 neg/neg. Last mammogram:  09/2016 [facility]: negative.  Obstetric History OB History  Gravida Para Term Preterm AB Living  0 0 0 0 0 0  SAB TAB Ectopic Multiple Live Births  0 0 0 0          Past Medical History:  Diagnosis Date  . Back pain   . Calculus of kidney 12/09/2013  . Chronic left shoulder pain 07/19/2015  . Galactorrhea 11/26/2014   Chronic   . Hereditary and idiopathic neuropathy 08/19/2013  . HPV test positive   . Hypercholesteremia 08/19/2013  . MS (multiple sclerosis) (HCC)   . Muscle spasticity 05/21/2014  . Status post laparoscopic supracervical hysterectomy 11/26/2014  . Tobacco user 11/26/2014    Past Surgical History:  Procedure Laterality Date  . ABDOMINAL HYSTERECTOMY    . bilateral tubal ligation  1996  . BREAST CYST EXCISION Left 2002  . KNEE SURGERY     right  . LAPAROSCOPIC SUPRACERVICAL HYSTERECTOMY  08/05/2013    Current Outpatient Prescriptions on File Prior to Visit  Medication Sig Dispense Refill  . baclofen (LIORESAL) 20 MG tablet Take 20 mg by mouth 3 (three) times daily.     . Calcium Carb-Cholecalciferol (CALCIUM 600+D) 600-800 MG-UNIT TABS Take 1 tablet by mouth daily.     Marland Kitchen HYDROcodone-acetaminophen (NORCO/VICODIN) 5-325 MG tablet Take 1 tablet by mouth every 6 (six) hours as needed for severe pain. 12 tablet 0  . losartan-hydrochlorothiazide (HYZAAR) 100-12.5 MG tablet Take 1 tablet by mouth daily.    . meloxicam (MOBIC) 15 MG tablet Take 1 tablet (15 mg total) by mouth 2 (two) times daily as needed for pain.  2  . oxybutynin (DITROPAN) 5 MG tablet Take 1 tablet (5 mg total) by mouth 2 (two) times daily.    Marland Kitchen tiZANidine (ZANAFLEX) 4 MG tablet Take 4 mg by mouth every 6 (six) hours as needed.     No current facility-administered medications on file prior to visit.     Allergies  Allergen Reactions  . Amlodipine Other (See Comments)    edema  . Povidone Iodine Rash    Social History   Social History  . Marital status: Single    Spouse name: N/A  . Number of children: N/A  . Years of education: N/A   Occupational History  . Not on file.   Social History Main Topics  . Smoking status: Former Smoker    Packs/day: 0.50    Types: Cigarettes  . Smokeless tobacco: Never Used  . Alcohol use No  . Drug use: No  . Sexual activity: No   Other Topics Concern  . Not on file   Social History Narrative   Lives at home  with parents. Has a walker for ambulation.    Family History  Problem Relation Age of Onset  . Sickle cell trait Sister   . Supraventricular tachycardia Sister   . Diabetes Maternal Grandmother   . Liver cancer Maternal Grandmother   . Diabetes Maternal Aunt   . Breast cancer Maternal Aunt        great MAT  . Ovarian cancer Paternal Grandmother   . Hypertension Father   . Hypertension Sister     The following portions of the patient's history were reviewed and updated as appropriate: allergies, current medications, past family history, past medical history, past social history, past surgical history and problem list.  Review of Systems Review of Systems  Constitutional: Negative.   HENT: Negative.   Eyes: Negative.    Respiratory: Negative.   Cardiovascular: Negative.   Gastrointestinal: Negative.   Genitourinary: Negative.   Musculoskeletal:       History of MS  Skin: Negative.   Neurological: Negative.   Endo/Heme/Allergies: Negative.   Psychiatric/Behavioral: Negative.     Objective:   BP 132/71   Pulse 82   Ht 5\' 11"  (1.803 m)   Wt 239 lb 6.4 oz (108.6 kg)   BMI 33.39 kg/m  CONSTITUTIONAL: Well-developed, well-nourished female in no acute distress.  PSYCHIATRIC: Normal mood and affect. Normal behavior. Normal judgment and thought content. NEUROLGIC: Alert and oriented to person, place, and time. Normal muscle tone coordination. No cranial nerve deficit noted. HENT:  Normocephalic, atraumatic, External right and left ear normal. Oropharynx is clear and moist EYES: Conjunctivae and EOM are normal. Pupils are equal, round, and reactive to light. No scleral icterus.  NECK: Normal range of motion, supple, no masses.  Normal thyroid.  SKIN: Skin is warm and dry. No rash noted. Not diaphoretic. No erythema. No pallor. CARDIOVASCULAR: Normal heart rate noted, regular rhythm, no murmur. RESPIRATORY: Clear to auscultation bilaterally. Effort and breath sounds normal, no problems with respiration noted. BREASTS: Symmetric in size. No masses, skin changes, nipple drainage, or lymphadenopathy. ABDOMEN: Soft, normal bowel sounds, no distention noted.  No tenderness, rebound or guarding.  BLADDER: Normal PELVIC:  External Genitalia: Normal  BUS: Normal  Vagina: Normal  Cervix: Not visualized; normal to palpation  Uterus: surgically absent  Adnexa: Normal; nonpalpable and nontender  RV: External Exam NormaI, No Rectal Masses and Normal Sphincter tone  MUSCULOSKELETAL: Normal range of motion. No tenderness.  No cyanosis, clubbing, or edema.  2+ distal pulses. SPASTICITY. LYMPHATIC: No Axillary, Supraclavicular, or Inguinal Adenopathy.    Assessment:   Annual gynecologic examination 56  y.o. Contraception: na (hysterectomy)-LSH BMI: 31 Multiple sclerosis, stable HPV positive on Pap smear; last Pap smear in 2017 negative/negative     Plan:  Pap: Due 2020 Mammogram:dutd Stool Guaiac Testing:  ordered. Labs: Lipid, HbgA1c, TSH Routine preventative health maintenance measures emphasized:  Encouraged healthy eating, portion control and exercise as tolerated. Return to Clinic - 1 [address] [place], [place]  Herold Harms, MD  Note: This dictation was prepared with Dragon dictation along with smaller phrase technology. Any transcriptional errors that result from this process are unintentional.

## 2016-11-28 ENCOUNTER — Encounter: Payer: Self-pay | Admitting: Obstetrics and Gynecology

## 2016-11-28 ENCOUNTER — Ambulatory Visit (INDEPENDENT_AMBULATORY_CARE_PROVIDER_SITE_OTHER): Payer: PPO | Admitting: Obstetrics and Gynecology

## 2016-11-28 VITALS — BP 132/71 | HR 82 | Ht 71.0 in | Wt 239.4 lb

## 2016-11-28 DIAGNOSIS — Z01419 Encounter for gynecological examination (general) (routine) without abnormal findings: Secondary | ICD-10-CM | POA: Diagnosis not present

## 2016-11-28 DIAGNOSIS — G35D Multiple sclerosis, unspecified: Secondary | ICD-10-CM

## 2016-11-28 DIAGNOSIS — Z1231 Encounter for screening mammogram for malignant neoplasm of breast: Secondary | ICD-10-CM | POA: Diagnosis not present

## 2016-11-28 DIAGNOSIS — G35 Multiple sclerosis: Secondary | ICD-10-CM

## 2016-11-28 DIAGNOSIS — Z1211 Encounter for screening for malignant neoplasm of colon: Secondary | ICD-10-CM | POA: Diagnosis not present

## 2016-11-28 DIAGNOSIS — Z90711 Acquired absence of uterus with remaining cervical stump: Secondary | ICD-10-CM

## 2016-11-28 DIAGNOSIS — Z780 Asymptomatic menopausal state: Secondary | ICD-10-CM

## 2016-11-28 NOTE — Patient Instructions (Signed)
1. No Pap smear is needed 2. Mammogram already done this year 3. Stool guaiac card testing for colon cancer screening is ordered 4. Screening labs are ordered 5. Continue with healthy eating and exercise as tolerated 6. Return in 1 year for annual exam   Health Maintenance for Postmenopausal Women Menopause is a normal process in which your reproductive ability comes to an end. This process happens gradually over a span of months to years, usually between the ages of 49 and 63. Menopause is complete when you have missed 12 consecutive menstrual periods. It is important to talk with your health care provider about some of the most common conditions that affect postmenopausal women, such as heart disease, cancer, and bone loss (osteoporosis). Adopting a healthy lifestyle and getting preventive care can help to promote your health and wellness. Those actions can also lower your chances of developing some of these common conditions. What should I know about menopause? During menopause, you may experience a number of symptoms, such as:  Moderate-to-severe hot flashes.  Night sweats.  Decrease in sex drive.  Mood swings.  Headaches.  Tiredness.  Irritability.  Memory problems.  Insomnia.  Choosing to treat or not to treat menopausal changes is an individual decision that you make with your health care provider. What should I know about hormone replacement therapy and supplements? Hormone therapy products are effective for treating symptoms that are associated with menopause, such as hot flashes and night sweats. Hormone replacement carries certain risks, especially as you become older. If you are thinking about using estrogen or estrogen with progestin treatments, discuss the benefits and risks with your health care provider. What should I know about heart disease and stroke? Heart disease, heart attack, and stroke become more likely as you age. This may be due, in part, to the hormonal  changes that your body experiences during menopause. These can affect how your body processes dietary fats, triglycerides, and cholesterol. Heart attack and stroke are both medical emergencies. There are many things that you can do to help prevent heart disease and stroke:  Have your blood pressure checked at least every 1-2 years. High blood pressure causes heart disease and increases the risk of stroke.  If you are 18-34 years old, ask your health care provider if you should take aspirin to prevent a heart attack or a stroke.  Do not use any tobacco products, including cigarettes, chewing tobacco, or electronic cigarettes. If you need help quitting, ask your health care provider.  It is important to eat a healthy diet and maintain a healthy weight. ? Be sure to include plenty of vegetables, fruits, low-fat dairy products, and lean protein. ? Avoid eating foods that are high in solid fats, added sugars, or salt (sodium).  Get regular exercise. This is one of the most important things that you can do for your health. ? Try to exercise for at least 150 minutes each week. The type of exercise that you do should increase your heart rate and make you sweat. This is known as moderate-intensity exercise. ? Try to do strengthening exercises at least twice each week. Do these in addition to the moderate-intensity exercise.  Know your numbers.Ask your health care provider to check your cholesterol and your blood glucose. Continue to have your blood tested as directed by your health care provider.  What should I know about cancer screening? There are several types of cancer. Take the following steps to reduce your risk and to catch any cancer development  as early as possible. Breast Cancer  Practice breast self-awareness. ? This means understanding how your breasts normally appear and feel. ? It also means doing regular breast self-exams. Let your health care provider know about any changes, no  matter how small.  If you are 80 or older, have a clinician do a breast exam (clinical breast exam or CBE) every year. Depending on your age, family history, and medical history, it may be recommended that you also have a yearly breast X-ray (mammogram).  If you have a family history of breast cancer, talk with your health care provider about genetic screening.  If you are at high risk for breast cancer, talk with your health care provider about having an MRI and a mammogram every year.  Breast cancer (BRCA) gene test is recommended for women who have family members with BRCA-related cancers. Results of the assessment will determine the need for genetic counseling and BRCA1 and for BRCA2 testing. BRCA-related cancers include these types: ? Breast. This occurs in males or females. ? Ovarian. ? Tubal. This may also be called fallopian tube cancer. ? Cancer of the abdominal or pelvic lining (peritoneal cancer). ? Prostate. ? Pancreatic.  Cervical, Uterine, and Ovarian Cancer Your health care provider may recommend that you be screened regularly for cancer of the pelvic organs. These include your ovaries, uterus, and vagina. This screening involves a pelvic exam, which includes checking for microscopic changes to the surface of your cervix (Pap test).  For women ages 21-65, health care providers may recommend a pelvic exam and a Pap test every three years. For women ages 98-65, they may recommend the Pap test and pelvic exam, combined with testing for human papilloma virus (HPV), every five years. Some types of HPV increase your risk of cervical cancer. Testing for HPV may also be done on women of any age who have unclear Pap test results.  Other health care providers may not recommend any screening for nonpregnant women who are considered low risk for pelvic cancer and have no symptoms. Ask your health care provider if a screening pelvic exam is right for you.  If you have had past treatment for  cervical cancer or a condition that could lead to cancer, you need Pap tests and screening for cancer for at least 20 years after your treatment. If Pap tests have been discontinued for you, your risk factors (such as having a new sexual partner) need to be reassessed to determine if you should start having screenings again. Some women have medical problems that increase the chance of getting cervical cancer. In these cases, your health care provider may recommend that you have screening and Pap tests more often.  If you have a family history of uterine cancer or ovarian cancer, talk with your health care provider about genetic screening.  If you have vaginal bleeding after reaching menopause, tell your health care provider.  There are currently no reliable tests available to screen for ovarian cancer.  Lung Cancer Lung cancer screening is recommended for adults 40-46 years old who are at high risk for lung cancer because of a history of smoking. A yearly low-dose CT scan of the lungs is recommended if you:  Currently smoke.  Have a history of at least 30 pack-years of smoking and you currently smoke or have quit within the past 15 years. A pack-year is smoking an average of one pack of cigarettes per day for one year.  Yearly screening should:  Continue until it has  been 15 years since you quit.  Stop if you develop a health problem that would prevent you from having lung cancer treatment.  Colorectal Cancer  This type of cancer can be detected and can often be prevented.  Routine colorectal cancer screening usually begins at age 35 and continues through age 51.  If you have risk factors for colon cancer, your health care provider may recommend that you be screened at an earlier age.  If you have a family history of colorectal cancer, talk with your health care provider about genetic screening.  Your health care provider may also recommend using home test kits to check for hidden  blood in your stool.  A small camera at the end of a tube can be used to examine your colon directly (sigmoidoscopy or colonoscopy). This is done to check for the earliest forms of colorectal cancer.  Direct examination of the colon should be repeated every 5-10 years until age 105. However, if early forms of precancerous polyps or small growths are found or if you have a family history or genetic risk for colorectal cancer, you may need to be screened more often.  Skin Cancer  Check your skin from head to toe regularly.  Monitor any moles. Be sure to tell your health care provider: ? About any new moles or changes in moles, especially if there is a change in a mole's shape or color. ? If you have a mole that is larger than the size of a pencil eraser.  If any of your family members has a history of skin cancer, especially at a young age, talk with your health care provider about genetic screening.  Always use sunscreen. Apply sunscreen liberally and repeatedly throughout the day.  Whenever you are outside, protect yourself by wearing long sleeves, pants, a wide-brimmed hat, and sunglasses.  What should I know about osteoporosis? Osteoporosis is a condition in which bone destruction happens more quickly than new bone creation. After menopause, you may be at an increased risk for osteoporosis. To help prevent osteoporosis or the bone fractures that can happen because of osteoporosis, the following is recommended:  If you are 64-19 years old, get at least 1,000 mg of calcium and at least 600 mg of vitamin D per day.  If you are older than age 29 but younger than age 58, get at least 1,200 mg of calcium and at least 600 mg of vitamin D per day.  If you are older than age 7, get at least 1,200 mg of calcium and at least 800 mg of vitamin D per day.  Smoking and excessive alcohol intake increase the risk of osteoporosis. Eat foods that are rich in calcium and vitamin D, and do weight-bearing  exercises several times each week as directed by your health care provider. What should I know about how menopause affects my mental health? Depression may occur at any age, but it is more common as you become older. Common symptoms of depression include:  Low or sad mood.  Changes in sleep patterns.  Changes in appetite or eating patterns.  Feeling an overall lack of motivation or enjoyment of activities that you previously enjoyed.  Frequent crying spells.  Talk with your health care provider if you think that you are experiencing depression. What should I know about immunizations? It is important that you get and maintain your immunizations. These include:  Tetanus, diphtheria, and pertussis (Tdap) booster vaccine.  Influenza every year before the flu season begins.  Pneumonia vaccine.  Shingles vaccine.  Your health care provider may also recommend other immunizations. This information is not intended to replace advice given to you by your health care provider. Make sure you discuss any questions you have with your health care provider. Document Released: 06/16/2005 Document Revised: 11/12/2015 Document Reviewed: 01/26/2015 Elsevier Interactive Patient Education  2018 Elsevier Inc.  

## 2016-11-29 ENCOUNTER — Other Ambulatory Visit: Payer: Self-pay | Admitting: Obstetrics and Gynecology

## 2016-11-29 ENCOUNTER — Other Ambulatory Visit: Payer: PPO

## 2016-12-01 LAB — TSH: TSH: 2.52 u[IU]/mL (ref 0.450–4.500)

## 2016-12-01 LAB — LIPID PANEL
CHOL/HDL RATIO: 3.4 ratio (ref 0.0–4.4)
Cholesterol, Total: 187 mg/dL (ref 100–199)
HDL: 55 mg/dL (ref >39)
LDL Calculated: 119 mg/dL — ABNORMAL HIGH (ref 0–99)
Triglycerides: 64 mg/dL (ref 0–149)
VLDL Cholesterol Cal: 13 mg/dL (ref 5–40)

## 2016-12-01 LAB — HEMOGLOBIN A1C
Est. average glucose Bld gHb Est-mCnc: 100 mg/dL
HEMOGLOBIN A1C: 5.1 % (ref 4.8–5.6)

## 2016-12-01 LAB — GLUCOSE, RANDOM: GLUCOSE: 83 mg/dL (ref 65–99)

## 2016-12-11 DIAGNOSIS — I10 Essential (primary) hypertension: Secondary | ICD-10-CM | POA: Diagnosis not present

## 2016-12-11 DIAGNOSIS — B351 Tinea unguium: Secondary | ICD-10-CM | POA: Diagnosis not present

## 2016-12-11 DIAGNOSIS — G35 Multiple sclerosis: Secondary | ICD-10-CM | POA: Diagnosis not present

## 2016-12-11 DIAGNOSIS — E669 Obesity, unspecified: Secondary | ICD-10-CM | POA: Diagnosis not present

## 2016-12-11 DIAGNOSIS — Q6689 Other  specified congenital deformities of feet: Secondary | ICD-10-CM | POA: Diagnosis not present

## 2016-12-11 DIAGNOSIS — M2580 Other specified joint disorders, unspecified joint: Secondary | ICD-10-CM | POA: Diagnosis not present

## 2016-12-11 DIAGNOSIS — L600 Ingrowing nail: Secondary | ICD-10-CM | POA: Diagnosis not present

## 2016-12-27 DIAGNOSIS — Z1211 Encounter for screening for malignant neoplasm of colon: Secondary | ICD-10-CM | POA: Diagnosis not present

## 2016-12-31 LAB — FECAL OCCULT BLOOD, IMMUNOCHEMICAL: Fecal Occult Bld: NEGATIVE

## 2016-12-31 LAB — PLEASE NOTE

## 2017-01-16 ENCOUNTER — Observation Stay
Admission: EM | Admit: 2017-01-16 | Discharge: 2017-01-18 | Disposition: A | Discharge status: Skilled Nursing Facility | Payer: PPO | Attending: Internal Medicine | Admitting: Internal Medicine

## 2017-01-16 ENCOUNTER — Emergency Department: Payer: PPO

## 2017-01-16 ENCOUNTER — Encounter: Payer: Self-pay | Admitting: Emergency Medicine

## 2017-01-16 ENCOUNTER — Ambulatory Visit: Payer: Self-pay | Admitting: Orthopedic Surgery

## 2017-01-16 ENCOUNTER — Encounter
Admission: RE | Admit: 2017-01-16 | Discharge: 2017-01-16 | Disposition: A | Discharge status: Home / Self Care | Payer: PPO | Source: Ambulatory Visit | Attending: Internal Medicine | Admitting: Internal Medicine

## 2017-01-16 DIAGNOSIS — S52532A Colles' fracture of left radius, initial encounter for closed fracture: Principal | ICD-10-CM | POA: Insufficient documentation

## 2017-01-16 DIAGNOSIS — G35 Multiple sclerosis: Secondary | ICD-10-CM | POA: Insufficient documentation

## 2017-01-16 DIAGNOSIS — Z87891 Personal history of nicotine dependence: Secondary | ICD-10-CM | POA: Insufficient documentation

## 2017-01-16 DIAGNOSIS — N390 Urinary tract infection, site not specified: Secondary | ICD-10-CM | POA: Insufficient documentation

## 2017-01-16 DIAGNOSIS — M25532 Pain in left wrist: Secondary | ICD-10-CM | POA: Diagnosis not present

## 2017-01-16 DIAGNOSIS — N643 Galactorrhea not associated with childbirth: Secondary | ICD-10-CM | POA: Diagnosis not present

## 2017-01-16 DIAGNOSIS — I10 Essential (primary) hypertension: Secondary | ICD-10-CM | POA: Diagnosis not present

## 2017-01-16 DIAGNOSIS — E7800 Pure hypercholesterolemia, unspecified: Secondary | ICD-10-CM | POA: Insufficient documentation

## 2017-01-16 DIAGNOSIS — G609 Hereditary and idiopathic neuropathy, unspecified: Secondary | ICD-10-CM | POA: Insufficient documentation

## 2017-01-16 DIAGNOSIS — R29898 Other symptoms and signs involving the musculoskeletal system: Secondary | ICD-10-CM | POA: Diagnosis not present

## 2017-01-16 DIAGNOSIS — Z01818 Encounter for other preprocedural examination: Secondary | ICD-10-CM | POA: Diagnosis not present

## 2017-01-16 DIAGNOSIS — Z87442 Personal history of urinary calculi: Secondary | ICD-10-CM | POA: Diagnosis not present

## 2017-01-16 DIAGNOSIS — S62102A Fracture of unspecified carpal bone, left wrist, initial encounter for closed fracture: Secondary | ICD-10-CM | POA: Diagnosis not present

## 2017-01-16 DIAGNOSIS — S52612A Displaced fracture of left ulna styloid process, initial encounter for closed fracture: Secondary | ICD-10-CM | POA: Diagnosis not present

## 2017-01-16 DIAGNOSIS — Y92002 Bathroom of unspecified non-institutional (private) residence single-family (private) house as the place of occurrence of the external cause: Secondary | ICD-10-CM | POA: Insufficient documentation

## 2017-01-16 DIAGNOSIS — R9431 Abnormal electrocardiogram [ECG] [EKG]: Secondary | ICD-10-CM | POA: Diagnosis not present

## 2017-01-16 DIAGNOSIS — W1839XA Other fall on same level, initial encounter: Secondary | ICD-10-CM | POA: Insufficient documentation

## 2017-01-16 DIAGNOSIS — Z79899 Other long term (current) drug therapy: Secondary | ICD-10-CM | POA: Diagnosis not present

## 2017-01-16 DIAGNOSIS — W19XXXA Unspecified fall, initial encounter: Secondary | ICD-10-CM | POA: Diagnosis not present

## 2017-01-16 DIAGNOSIS — S62109A Fracture of unspecified carpal bone, unspecified wrist, initial encounter for closed fracture: Secondary | ICD-10-CM

## 2017-01-16 HISTORY — DX: Fracture of unspecified carpal bone, unspecified wrist, initial encounter for closed fracture: S62.109A

## 2017-01-16 LAB — COMPREHENSIVE METABOLIC PANEL
ALBUMIN: 3.8 g/dL (ref 3.5–5.0)
ALT: 14 U/L (ref 14–54)
AST: 18 U/L (ref 15–41)
Alkaline Phosphatase: 99 U/L (ref 38–126)
Anion gap: 5 (ref 5–15)
BUN: 26 mg/dL — AB (ref 6–20)
CHLORIDE: 112 mmol/L — AB (ref 101–111)
CO2: 25 mmol/L (ref 22–32)
Calcium: 8.9 mg/dL (ref 8.9–10.3)
Creatinine, Ser: 0.61 mg/dL (ref 0.44–1.00)
GFR calc Af Amer: >60 mL/min (ref >60)
GFR calc non Af Amer: >60 mL/min (ref >60)
GLUCOSE: 95 mg/dL (ref 65–99)
POTASSIUM: 3.8 mmol/L (ref 3.5–5.1)
SODIUM: 142 mmol/L (ref 135–145)
Total Bilirubin: 0.6 mg/dL (ref 0.3–1.2)
Total Protein: 7 g/dL (ref 6.5–8.1)

## 2017-01-16 LAB — PROTIME-INR
INR: 1.14
Prothrombin Time: 14.5 seconds (ref 11.4–15.2)

## 2017-01-16 LAB — URINALYSIS, COMPLETE (UACMP) WITH MICROSCOPIC
BACTERIA UA: NONE SEEN
BILIRUBIN URINE: NEGATIVE
Glucose, UA: NEGATIVE mg/dL
KETONES UR: 20 mg/dL — AB
Leukocytes, UA: NEGATIVE
Nitrite: POSITIVE — AB
PROTEIN: NEGATIVE mg/dL
Specific Gravity, Urine: 1.02 (ref 1.005–1.030)
pH: 5 (ref 5.0–8.0)

## 2017-01-16 LAB — CBC
HCT: 36.9 % (ref 35.0–47.0)
Hemoglobin: 12.7 g/dL (ref 12.0–16.0)
MCH: 27.3 pg (ref 26.0–34.0)
MCHC: 34.3 g/dL (ref 32.0–36.0)
MCV: 79.5 fL — AB (ref 80.0–100.0)
PLATELETS: 203 10*3/uL (ref 150–440)
RBC: 4.64 MIL/uL (ref 3.80–5.20)
RDW: 14.2 % (ref 11.5–14.5)
WBC: 8.8 10*3/uL (ref 3.6–11.0)

## 2017-01-16 LAB — SURGICAL PCR SCREEN
MRSA, PCR: NEGATIVE
STAPHYLOCOCCUS AUREUS: NEGATIVE

## 2017-01-16 LAB — APTT: APTT: 31 s (ref 24–36)

## 2017-01-16 MED ORDER — MORPHINE SULFATE (PF) 4 MG/ML IV SOLN
4.0000 mg | Freq: Once | INTRAVENOUS | Status: AC
  Administered 2017-01-16: 4 mg via INTRAVENOUS

## 2017-01-16 MED ORDER — TIZANIDINE HCL 4 MG PO TABS
4.0000 mg | ORAL_TABLET | Freq: Four times a day (QID) | ORAL | Status: DC | PRN
  Filled 2017-01-16: qty 1

## 2017-01-16 MED ORDER — CEFAZOLIN SODIUM-DEXTROSE 2-4 GM/100ML-% IV SOLN
2.0000 g | INTRAVENOUS | Status: AC
  Administered 2017-01-17: 2 g via INTRAVENOUS
  Filled 2017-01-16: qty 100

## 2017-01-16 MED ORDER — ENOXAPARIN SODIUM 40 MG/0.4ML ~~LOC~~ SOLN
40.0000 mg | SUBCUTANEOUS | Status: DC
  Administered 2017-01-17: 40 mg via SUBCUTANEOUS
  Filled 2017-01-16: qty 0.4

## 2017-01-16 MED ORDER — MORPHINE SULFATE (PF) 2 MG/ML IV SOLN
2.0000 mg | INTRAVENOUS | Status: DC | PRN
  Filled 2017-01-16: qty 1

## 2017-01-16 MED ORDER — CALCIUM-VITAMIN D 500-200 MG-UNIT PO TABS
1.0000 | ORAL_TABLET | Freq: Every day | ORAL | Status: DC
Start: 2017-01-17 — End: 2017-01-18
  Administered 2017-01-18: 10:00:00 1 via ORAL
  Filled 2017-01-16 (×2): qty 1

## 2017-01-16 MED ORDER — MIDAZOLAM HCL 5 MG/5ML IJ SOLN
INTRAMUSCULAR | Status: AC
  Administered 2017-01-16: 1 mg via INTRAVENOUS
  Filled 2017-01-16: qty 5

## 2017-01-16 MED ORDER — ACETAMINOPHEN 500 MG PO TABS
1000.0000 mg | ORAL_TABLET | Freq: Once | ORAL | Status: AC
  Administered 2017-01-16: 1000 mg via ORAL
  Filled 2017-01-16: qty 2

## 2017-01-16 MED ORDER — BACLOFEN 10 MG PO TABS
20.0000 mg | ORAL_TABLET | Freq: Three times a day (TID) | ORAL | Status: DC
  Administered 2017-01-16 – 2017-01-18 (×6): 20 mg via ORAL
  Filled 2017-01-16 (×2): qty 1
  Filled 2017-01-16 (×2): qty 2
  Filled 2017-01-16: qty 1
  Filled 2017-01-16 (×3): qty 2

## 2017-01-16 MED ORDER — DOCUSATE SODIUM 100 MG PO CAPS: 100.0000 mg | ORAL_CAPSULE | Freq: Two times a day (BID) | ORAL | Status: DC | PRN

## 2017-01-16 MED ORDER — LOSARTAN POTASSIUM 50 MG PO TABS
100.0000 mg | ORAL_TABLET | Freq: Every day | ORAL | Status: DC
  Administered 2017-01-16 – 2017-01-18 (×2): 100 mg via ORAL
  Filled 2017-01-16 (×2): qty 2

## 2017-01-16 MED ORDER — OXYCODONE-ACETAMINOPHEN 5-325 MG PO TABS
1.0000 | ORAL_TABLET | ORAL | Status: DC | PRN
  Administered 2017-01-16 – 2017-01-18 (×3): 1 via ORAL
  Filled 2017-01-16 (×3): qty 1

## 2017-01-16 MED ORDER — ONDANSETRON HCL 4 MG/2ML IJ SOLN
4.0000 mg | Freq: Once | INTRAMUSCULAR | Status: AC
  Administered 2017-01-16: 4 mg via INTRAVENOUS

## 2017-01-16 MED ORDER — BACLOFEN 10 MG PO TABS
20.0000 mg | ORAL_TABLET | Freq: Three times a day (TID) | ORAL | Status: DC
Start: 2017-01-16 — End: 2017-01-16
  Filled 2017-01-16 (×2): qty 1

## 2017-01-16 MED ORDER — CHLORHEXIDINE GLUCONATE 4 % EX LIQD: 60.0000 mL | Freq: Once | CUTANEOUS | Status: DC

## 2017-01-16 MED ORDER — ADULT MULTIVITAMIN W/MINERALS CH
1.0000 | ORAL_TABLET | Freq: Every day | ORAL | Status: DC
  Administered 2017-01-16 – 2017-01-18 (×2): 1 via ORAL
  Filled 2017-01-16 (×2): qty 1

## 2017-01-16 MED ORDER — OXYBUTYNIN CHLORIDE 5 MG PO TABS
5.0000 mg | ORAL_TABLET | Freq: Two times a day (BID) | ORAL | Status: DC
  Filled 2017-01-16: qty 1

## 2017-01-16 MED ORDER — GABAPENTIN 300 MG PO CAPS
300.0000 mg | ORAL_CAPSULE | Freq: Once | ORAL | Status: AC
  Administered 2017-01-16: 300 mg via ORAL
  Filled 2017-01-16: qty 1

## 2017-01-16 MED ORDER — MIDAZOLAM HCL 2 MG/2ML IJ SOLN
1.0000 mg | Freq: Once | INTRAMUSCULAR | Status: AC
  Administered 2017-01-16: 1 mg via INTRAVENOUS

## 2017-01-16 MED ORDER — OXYBUTYNIN CHLORIDE 5 MG PO TABS
5.0000 mg | ORAL_TABLET | Freq: Two times a day (BID) | ORAL | Status: DC
  Administered 2017-01-16 – 2017-01-18 (×4): 5 mg via ORAL
  Filled 2017-01-16 (×6): qty 1

## 2017-01-16 NOTE — Progress Notes (Signed)
Per [facility] admissions coordinator at [facility] they can extend a bed offer to patient. Clinical Child psychotherapist (CSW) made patient's sister Jasmine December and mother Claris Che aware of above and they accepted bed offer from [facility]. Health Team case manager is aware of above.   [employer], LCSW [phone]

## 2017-01-16 NOTE — ED Triage Notes (Signed)
Pt to ED via EMS from home with c/o mechanical fall. Pt c/o LFT wrist pain, pt has obvious deformity, pt denies any other injuries. MD at bedside

## 2017-01-16 NOTE — Clinical Social Work Note (Signed)
Clinical Social Work Assessment  Patient Details  Name: Lisa Williams MRN: [id number] Date of Birth: [birth year]  Date of referral:  01/16/17               Reason for consult:  Facility Placement                Permission sought to share information with:  Chartered certified accountant granted to share information::  Yes, Verbal Permission Granted  Name::      [name]::   [name]   Relationship::     Contact Information:     Housing/Transportation Living arrangements for the past 2 months:  [place] of Information:  Patient, Parent, Other (Comment Required) (Sister Lisa Williams ) Patient Interpreter Needed:  None Criminal Activity/Legal Involvement Pertinent to Current Situation/Hospitalization:  No - Comment as needed Significant Relationships:  Siblings, Parents Lives with:  Siblings Do you feel safe going back to the place where you live?  Yes Need for family participation in patient care:  Yes (Comment)  Care giving concerns:  Patient lives in [place] with her sister Lisa Williams.    Social Worker assessment / plan:  Holiday representative (CSW) received verbal consult from Dr. Harlow Mares that patient will have surgery tomorrow for a wrist fracture and will need SNF because she will be non-weightbearing through the left upper extremity for a minimum of 3 months. CSW met with patient and her sister Lisa Williams and mother Lisa Williams were at bedside. Patient was asleep and did not participate in assessment. Per Lisa Williams patient lives in [place] with her sister Lisa Williams. Per Lisa Williams patient walks with a walker and uses a wheel chair mostly around the house. Per Lisa Williams patient is able to transfer herself to the wheel chair using her walker. Per Lisa Williams patient is independent with her ADLs such has bathing, dressing and feeding herself but will not be after this surgery. Per Lisa Williams patient has MS and will not be able to balance herself  using her wrist with the walker. Per Lisa Williams patient wears AFOs (ankle-foot orthosis) on both of her legs. Per Lisa Williams patient is baseline alert and oriented X4 and makes her own decisions. Per Lisa Williams patient's dominate hand is her right. CSW explained that PT will work with patient after surgery and make a recommendation of home health or SNF. Lisa Williams reported that patient will need SNF and they prefer [facility]. CSW explained that patient's Health Team insurance will have to approve SNF. Lisa Williams verbalized her understanding. FL2 complete and faxed out. Health Team case manager is aware of above. CSW will continue to follow and assist as needed.    Employment status:  Disabled (Comment on whether or not currently receiving Disability) Insurance information:  Managed Medicare PT Recommendations:  Not assessed at this time Information / Referral to community resources:  [place]  Patient/Family's Response to care:  Patient's sister is requesting SNF placement.   Patient/Family's Understanding of and Emotional Response to Diagnosis, Current Treatment, and Prognosis:  Patient's sister and mother were very pleasant and thanked CSW for assistance.   Emotional Assessment Appearance:  Appears stated age Attitude/Demeanor/Rapport:  Unable to Assess Affect (typically observed):  Unable to Assess Orientation:  Oriented to Self, Oriented to Place, Oriented to  Time, Oriented to Situation Alcohol / Substance use:  Not Applicable Psych involvement (Current and /or in the community):  No (Comment)  Discharge Needs  Concerns to be addressed:  Discharge Planning Concerns Readmission within  the last 30 days:  No Current discharge risk:  Dependent with Mobility Barriers to Discharge:  Continued Medical Work up   [employer], Veronia Beets, LCSW 01/16/2017, 3:02 PM

## 2017-01-16 NOTE — Progress Notes (Signed)
Per Dr. Odis Luster hold tonight's dose of Lovenox due to patient having surgery tomorrow.

## 2017-01-16 NOTE — Progress Notes (Signed)
Patient admitted to unit. Alert and oriented x 4. Family at bedside. Skin assessment completed.    Stephannie Peters, RN

## 2017-01-16 NOTE — Progress Notes (Addendum)
PT Cancellation Note  Patient Details Name: Lisa Williams MRN: [id number] DOB: 23-Nov-1960   Cancelled Treatment:    Reason Eval/Treat Not Completed: Other (comment) (Consult received and chart reviewed. Per primary RN, patient not appropriate/able to tolerate PT evaluation at this time.  Will re-attempt next date after L wrist surgery (NWB).  Anticipate need for new orders post-op due to surgery/general anesthesia.)   Asael Pann H. Manson Passey, PT, DPT, NCS 01/16/17, 3:58 PM [phone]

## 2017-01-16 NOTE — ED Notes (Signed)
Writer accompanied patient to CT scan.  Patient AAOx3.  Skin warm and dry.  Respirations regular and non labored.  Tolerated CT scan well.  Remained on cardiac monitoring for imaging.   Continue to monitor.

## 2017-01-16 NOTE — [place] FL2 (Signed)
[place] MEDICAID FL2 LEVEL OF CARE SCREENING TOOL     IDENTIFICATION  Patient Name: Lisa Williams Birthdate: 1960/12/09 Sex: female Admission Date (Current Location): 01/16/2017  [place] and [place] Number:  Chiropodist and Address:  [facility], [address], [place], [place] [address]      Provider Number: [id number]  Attending Physician Name and Address:  Altamese Dilling, *  Relative Name and Phone Number:       Current Level of Care: Hospital Recommended Level of Care: Skilled Nursing Facility Prior Approval Number:    Date Approved/Denied:   PASRR Number:  ([id number]  )  Discharge Plan: SNF    Current Diagnoses: Patient Active Problem List   Diagnosis Date Noted  . Wrist fracture 01/16/2017  . Health care maintenance 01/24/2016  . Blood pressure elevated without history of HTN 10/25/2015  . Multiple sclerosis (HCC) 10/02/2015  . Chronic left shoulder pain 07/19/2015  . Multiple sclerosis exacerbation (HCC) 07/14/2015  . MS (multiple sclerosis) (HCC) 11/26/2014  . Increased body mass index 11/26/2014  . HPV test positive 11/26/2014  . Status post laparoscopic supracervical hysterectomy 11/26/2014  . Galactorrhea 11/26/2014  . Tobacco user 11/26/2014  . Back ache 05/21/2014  . Adiposity 05/21/2014  . Disordered sleep 05/21/2014  . Muscle spasticity 05/21/2014  . Calculus of kidney 12/09/2013  . Renal colic 12/09/2013  . Hypercholesteremia 08/19/2013  . Hereditary and idiopathic neuropathy 08/19/2013  . Hypercholesterolemia without hypertriglyceridemia 08/19/2013  . Bladder infection, chronic 07/25/2012  . Disorder of bladder function 07/25/2012  . Incomplete bladder emptying 07/25/2012  . Microscopic hematuria 07/25/2012    Orientation RESPIRATION BLADDER Height & Weight     Self, Time, Situation, Place  Normal Continent Weight: 234 lb (106.1 kg) Height:  5\' 11"  (180.3 cm)  BEHAVIORAL  SYMPTOMS/MOOD NEUROLOGICAL BOWEL NUTRITION STATUS      Continent Diet  AMBULATORY STATUS COMMUNICATION OF NEEDS Skin   Extensive Assist Verbally Surgical wounds                       Personal Care Assistance Level of Assistance  Bathing, Feeding, Dressing Bathing Assistance: Limited assistance Feeding assistance: Independent Dressing Assistance: Limited assistance     Functional Limitations Info  Sight, Hearing, Speech Sight Info: Adequate Hearing Info: Adequate Speech Info: Adequate    SPECIAL CARE FACTORS FREQUENCY  PT (By licensed PT)     PT Frequency:  (5)              Contractures      Additional Factors Info  Code Status, Allergies Code Status Info:  (Full Code. ) Allergies Info:  (Amlodipine, Povidone Iodine)           Current Medications (01/16/2017):  This is the current hospital active medication list Current Facility-Administered Medications  Medication Dose Route Frequency Provider Last Rate Last Dose  . baclofen (LIORESAL) tablet 20 mg  20 mg Oral TID Altamese Dilling, MD      . Melene Muller ON 01/17/2017] calcium-vitamin D 500-200 MG-UNIT per tablet 1 tablet  1 tablet Oral Daily Altamese Dilling, MD      . docusate sodium (COLACE) capsule 100 mg  100 mg Oral BID PRN Altamese Dilling, MD      . enoxaparin (LOVENOX) injection 40 mg  40 mg Subcutaneous Q24H Altamese Dilling, MD      . losartan (COZAAR) tablet 100 mg  100 mg Oral Daily Altamese Dilling, MD   100 mg at 01/16/17  1421  . morphine 2 MG/ML injection 2 mg  2 mg Intravenous Q4H PRN Altamese Dilling, MD      . multivitamin with minerals tablet 1 tablet  1 tablet Oral Daily Altamese Dilling, MD      . oxybutynin (DITROPAN) tablet 5 mg  5 mg Oral BID Altamese Dilling, MD      . oxyCODONE-acetaminophen (PERCOCET/ROXICET) 5-325 MG per tablet 1 tablet  1 tablet Oral Q4H PRN Altamese Dilling, MD   1 tablet at 01/16/17 1404  . tiZANidine (ZANAFLEX)  tablet 4 mg  4 mg Oral Q6H PRN Altamese Dilling, MD         Discharge Medications: Please see discharge summary for a list of discharge medications.  Relevant Imaging Results:  Relevant Lab Results:   Additional Information  (SSN: [id number])  Sample, Darleen Crocker, LCSW

## 2017-01-16 NOTE — Consult Note (Addendum)
ORTHOPAEDIC CONSULTATION  REQUESTING PHYSICIAN: Altamese Dilling, *  Chief Complaint: left wrist pain  HPI: Lisa Williams is a 56 y.o. female who complains of  Left wrist pain after a fall. She has a history of MS with severe lower extremity weakness. She denies any numbness, tingling or constitutional symptoms.  Past Medical History:  Diagnosis Date  . Back pain   . Calculus of kidney 12/09/2013  . Chronic left shoulder pain 07/19/2015  . Galactorrhea 11/26/2014   Chronic   . Hereditary and idiopathic neuropathy 08/19/2013  . HPV test positive   . Hypercholesteremia 08/19/2013  . MS (multiple sclerosis) (HCC)   . Muscle spasticity 05/21/2014  . Status post laparoscopic supracervical hysterectomy 11/26/2014  . Tobacco user 11/26/2014  . Wrist fracture    Past Surgical History:  Procedure Laterality Date  . ABDOMINAL HYSTERECTOMY    . bilateral tubal ligation  1996  . BREAST CYST EXCISION Left 2002  . KNEE SURGERY     right  . LAPAROSCOPIC SUPRACERVICAL HYSTERECTOMY  08/05/2013   Social History   Social History  . Marital status: Single    Spouse name: N/A  . Number of children: N/A  . Years of education: N/A   Social History Main Topics  . Smoking status: Former Smoker    Packs/day: 0.50    Types: Cigarettes  . Smokeless tobacco: Never Used  . Alcohol use No  . Drug use: No  . Sexual activity: No   Other Topics Concern  . None   Social History Narrative   Lives at home with parents. Has a walker for ambulation.   Family History  Problem Relation Age of Onset  . Sickle cell trait Sister   . Supraventricular tachycardia Sister   . Diabetes Maternal Grandmother   . Liver cancer Maternal Grandmother   . Diabetes Maternal Aunt   . Breast cancer Maternal Aunt        great MAT  . Ovarian cancer Paternal Grandmother   . Hypertension Father   . Hypertension Sister    Allergies  Allergen Reactions  . Amlodipine Other (See Comments)    edema  .  Povidone Iodine Rash   Prior to Admission medications   Medication Sig Start Date End Date Taking? Authorizing Provider  baclofen (LIORESAL) 20 MG tablet Take 20 mg by mouth 3 (three) times daily.    Yes [provider]  Calcium Carb-Cholecalciferol (CALCIUM 600+D) 600-800 MG-UNIT TABS Take 1 tablet by mouth daily.   Yes [provider]  losartan (COZAAR) 100 MG tablet Take 100 mg by mouth daily.  11/27/16 11/27/17 Yes [provider]  Multiple Vitamin (MULTIVITAMIN) tablet Take 1 tablet by mouth daily.   Yes [provider]  oxybutynin (DITROPAN) 5 MG tablet Take 1 tablet (5 mg total) by mouth 2 (two) times daily. 03/27/16  Yes Milagros Loll, MD  RITUXIMAB IV  09/06/16  Yes [provider]  meloxicam (MOBIC) 15 MG tablet Take 1 tablet (15 mg total) by mouth 2 (two) times daily as needed for pain. 03/27/16   Milagros Loll, MD  tiZANidine (ZANAFLEX) 4 MG tablet Take 4 mg by mouth every 6 (six) hours as needed. 06/21/16   [provider]   Dg Wrist Complete Left  Result Date: 01/16/2017 CLINICAL DATA:  Left wrist pain after fall. EXAM: LEFT WRIST - COMPLETE 3+ VIEW COMPARISON:  None. FINDINGS: There is a comminuted, intra-articular fracture of the distal radius with dorsal angulation. Radiocarpal alignment and carpal alignment  are preserved. Displaced fracture of the ulnar styloid. Bone mineralization is normal. Diffuse soft tissue swelling about the wrist. IMPRESSION: 1. Comminuted, intra-articular fracture of the distal radius with dorsal angulation. 2. Displaced ulnar styloid fracture. Electronically Signed   By: Obie Dredge M.D.   On: 01/16/2017 09:21   Ct Wrist Left Wo Contrast  Result Date: 01/16/2017 CLINICAL DATA:  Fractures of the distal left radius and ulna. EXAM: CT OF THE LEFT WRIST WITHOUT CONTRAST TECHNIQUE: Multidetector CT imaging was performed according to the standard protocol. Multiplanar CT image reconstructions were also  generated. COMPARISON:  Radiographs dated 01/16/2017 FINDINGS: Bones/Joint/Cartilage There is a severely comminuted fracture of the distal left radius with dorsal impaction and slight dorsal displacement of the distal fragments. There is a stellate component of the fracture involving the articular surface of the distal radius. There are several displaced and rotated cortical fragments. There is a tiny avulsion from the dorsal aspect of the distal ulna. IMPRESSION: 1. Comminuted impacted dorsally displaced fracture of the distal radius as described. 2. Tiny avulsion from the distal ulna. Electronically Signed   By: Francene Boyers M.D.   On: 01/16/2017 10:49   Dg Chest Port 1 View  Result Date: 01/16/2017 CLINICAL DATA:  Preop EXAM: PORTABLE CHEST 1 VIEW COMPARISON:  10/02/2015 FINDINGS: Heart is borderline in size. Lungs are clear. No effusions or edema. No acute bony abnormality. IMPRESSION: Borderline heart size.  No active disease. Electronically Signed   By: Charlett Nose M.D.   On: 01/16/2017 10:11    Positive ROS: All other systems have been reviewed and were otherwise negative with the exception of those mentioned in the HPI and as above.  Physical Exam: General: Alert, no acute distress Cardiovascular: 3+pedal edema Respiratory: No cyanosis, no use of accessory musculature GI: No organomegaly, abdomen is soft and non-tender Skin: No lesions in the area of chief complaint Neurologic: Sensation intact distally Psychiatric: Patient is competent for consent with normal mood and affect Lymphatic: No axillary or cervical lymphadenopathy  She has weakness of bilateral lower extremities of 2/5 hip flexion/extension, knee flexion/extension 1/5 DF/PF/EHL  MUSCULOSKELETAL: left wrist in a well padded volar resting splint. Good cap refill, sensation light touch is intact throughout all digits  Assessment: Comminuted, displaced intra-articular wrist fracture, left MS  Plan:  The diagnosis,  risks, benefits and alternatives to treatment are all discussed in detail with the patient and family. Risks include but are not limited to bleeding, infection, DVT, PE, nerve or vascular injury, non-union, repeat operation, persistent pain, weakness, stiffness and death. She understands and is eager to proceed. Plan volar plating of the left wrist tomorrow morning. She will be non-weightbearing through the left upper extremity for a minimum of 3 months. PT/OT and care coordination consults for discharge planning.     Lyndle Herrlich, MD    01/16/2017 2:24 PM

## 2017-01-16 NOTE — ED Provider Notes (Signed)
[facility] Emergency Department Provider Note   ____________________________________________    I have reviewed the triage vital signs and the nursing notes.   HISTORY  Chief Complaint Arm Pain     HPI Lisa Williams is a 56 y.o. female who presents with complaints of left arm pain. Patient reports she lost her balance and fell backwards catching herself on her left wrist. Positive deformity. Has not taken anything for pain. This occurred just prior to arrival. No other injuries reported. Patient typically ambulates with a walker because of history of MS.   Past Medical History:  Diagnosis Date  . Back pain   . Calculus of kidney 12/09/2013  . Chronic left shoulder pain 07/19/2015  . Galactorrhea 11/26/2014   Chronic   . Hereditary and idiopathic neuropathy 08/19/2013  . HPV test positive   . Hypercholesteremia 08/19/2013  . MS (multiple sclerosis) (HCC)   . Muscle spasticity 05/21/2014  . Status post laparoscopic supracervical hysterectomy 11/26/2014  . Tobacco user 11/26/2014    Patient Active Problem List   Diagnosis Date Noted  . Health care maintenance 01/24/2016  . Blood pressure elevated without history of HTN 10/25/2015  . Multiple sclerosis (HCC) 10/02/2015  . Chronic left shoulder pain 07/19/2015  . Multiple sclerosis exacerbation (HCC) 07/14/2015  . MS (multiple sclerosis) (HCC) 11/26/2014  . Increased body mass index 11/26/2014  . HPV test positive 11/26/2014  . Status post laparoscopic supracervical hysterectomy 11/26/2014  . Galactorrhea 11/26/2014  . Tobacco user 11/26/2014  . Back ache 05/21/2014  . Adiposity 05/21/2014  . Disordered sleep 05/21/2014  . Muscle spasticity 05/21/2014  . Calculus of kidney 12/09/2013  . Renal colic 12/09/2013  . Hypercholesteremia 08/19/2013  . Hereditary and idiopathic neuropathy 08/19/2013  . Hypercholesterolemia without hypertriglyceridemia 08/19/2013  . Bladder infection,  chronic 07/25/2012  . Disorder of bladder function 07/25/2012  . Incomplete bladder emptying 07/25/2012  . Microscopic hematuria 07/25/2012    Past Surgical History:  Procedure Laterality Date  . ABDOMINAL HYSTERECTOMY    . bilateral tubal ligation  1996  . BREAST CYST EXCISION Left 2002  . KNEE SURGERY     right  . LAPAROSCOPIC SUPRACERVICAL HYSTERECTOMY  08/05/2013    Prior to Admission medications   Medication Sig Start Date End Date Taking? Authorizing Provider  baclofen (LIORESAL) 20 MG tablet Take 20 mg by mouth 3 (three) times daily.    Yes [provider]  Calcium Carb-Cholecalciferol (CALCIUM 600+D) 600-800 MG-UNIT TABS Take 1 tablet by mouth daily.   Yes [provider]  losartan (COZAAR) 100 MG tablet Take 100 mg by mouth daily.  11/27/16 11/27/17 Yes [provider]  Multiple Vitamin (MULTIVITAMIN) tablet Take 1 tablet by mouth daily.   Yes [provider]  oxybutynin (DITROPAN) 5 MG tablet Take 1 tablet (5 mg total) by mouth 2 (two) times daily. 03/27/16  Yes Milagros Loll, MD  RITUXIMAB IV  09/06/16  Yes [provider]  meloxicam (MOBIC) 15 MG tablet Take 1 tablet (15 mg total) by mouth 2 (two) times daily as needed for pain. 03/27/16   Milagros Loll, MD  tiZANidine (ZANAFLEX) 4 MG tablet Take 4 mg by mouth every 6 (six) hours as needed. 06/21/16   [provider]     Allergies Amlodipine and Povidone iodine  Family History  Problem Relation Age of Onset  . Sickle cell trait Sister   . Supraventricular tachycardia Sister   . Diabetes Maternal Grandmother   . Liver  cancer Maternal Grandmother   . Diabetes Maternal Aunt   . Breast cancer Maternal Aunt        great MAT  . Ovarian cancer Paternal Grandmother   . Hypertension Father   . Hypertension Sister     Social History Social History  Substance Use Topics  . Smoking status: Former Smoker    Packs/day: 0.50    Types: Cigarettes  . Smokeless tobacco:  Never Used  . Alcohol use No    Review of Systems  Constitutional: No Dizziness Eyes: No visual changes.  ENT: No neck pain Cardiovascular: Denies chest pain. Respiratory: Denies shortness of breath. Gastrointestinal: No abdominal pain.    Genitourinary: Negative for dysuria. Musculoskeletal: Negative for back pain.Left wrist pain Skin: Negative for rash. Neurological: Negative for headaches or weakness   ____________________________________________   PHYSICAL EXAM:  VITAL SIGNS: ED Triage Vitals  Enc Vitals Group     BP 01/16/17 0845 (!) 179/90     Pulse Rate 01/16/17 0845 79     Resp 01/16/17 0845 16     Temp 01/16/17 0845 98.4 F (36.9 C)     Temp Source 01/16/17 0845 Oral     SpO2 01/16/17 0845 100 %     Weight 01/16/17 0846 106.1 kg (234 lb)     Height 01/16/17 0846 1.803 m ( )     Head Circumference --      Peak Flow --      Pain Score 01/16/17 0845 10     Pain Loc --      Pain Edu? --      Excl. in GC? --     Constitutional: Alert and oriented. No acute distress. Pleasant and interactive Eyes: Conjunctivae are normal.  Head: Atraumatic. Nose: No congestion/rhinnorhea. Mouth/Throat: Mucous membranes are moist.   Neck:  Painless ROM, No vertebral palpation Cardiovascular: Normal rate, regular rhythm.Good peripheral circulation. Respiratory: Normal respiratory effort.  No retractions.  Gastrointestinal: Soft and nontender. No distention.  No CVA tenderness. Genitourinary: deferred Musculoskeletal: No lower extremity tenderness. Full range of motion of the lower and upper extremities, except for the left wrist which is almost a deformed. Patient is able to move her fingers. Hand is warm and well-perfused. All extremities Warm and well perfused Neurologic:  Normal speech and language. No gross focal neurologic deficits are appreciated.  Skin:  Skin is warm, dry and intact. No rash noted. Psychiatric: Mood and affect are normal. Speech and behavior are  normal.  ____________________________________________   LABS (all labs ordered are listed, but only abnormal results are displayed)  Labs Reviewed  CBC - Abnormal; Notable for the following:       Result Value   MCV 79.5 (*)    All other components within normal limits  COMPREHENSIVE METABOLIC PANEL - Abnormal; Notable for the following:    Chloride 112 (*)    BUN 26 (*)    All other components within normal limits  APTT  PROTIME-INR   ____________________________________________  EKG  None ____________________________________________  RADIOLOGY  Left wrist x-ray shows comminuted intra-articular fracture of the distal radius ____________________________________________   PROCEDURES  Procedure(s) performed: yes  SPLINT APPLICATION Date/Time: 10:21 AM Authorized by: Jene Every Consent: Verbal consent obtained. Risks and benefits: risks, benefits and alternatives were discussed Consent given by: patient Splint applied by: RN Location details: left wrist Splint type: volar Supplies used: orthoglass Post-procedure: The splinted body part was neurovascularly unchanged following the procedure. Patient tolerance: Patient tolerated the procedure well with no  immediate complications.       Critical Care performed: No ____________________________________________   INITIAL IMPRESSION / ASSESSMENT AND PLAN / ED COURSE  Pertinent labs & imaging results that were available during my care of the patient were reviewed by me and considered in my medical decision making (see chart for details).  Patient presents after mechanical fall with clear left wrist deformity. No other injuries on exam. We will give IV morphine and IV Zofran, obtained x-rays and reevaluate.  Patient with fracture as detailed above, discussed with Dr. Odis Luster of orthopedics, plan is to admit to medicine for repair tomorrow AM. Patient not safe for discharge as unable to safely take care of  herself    ____________________________________________   FINAL CLINICAL IMPRESSION(S) / ED DIAGNOSES  Final diagnoses:  Closed Colles' fracture of left radius, initial encounter      NEW MEDICATIONS STARTED DURING THIS VISIT:  New Prescriptions   No medications on file     Note:  This document was prepared using Dragon voice recognition software and may include unintentional dictation errors.    Jene Every, MD 01/16/17 1022

## 2017-01-16 NOTE — Clinical Social Work Placement (Signed)
   CLINICAL SOCIAL WORK PLACEMENT  NOTE  Date:  01/16/2017  Patient Details  Name: Lisa Williams MRN: [id number] Date of Birth: 1960/09/20  Clinical Social Work is seeking post-discharge placement for this patient at the Skilled  Nursing Facility level of care (*CSW will initial, date and re-position this form in  chart as items are completed):  Yes   Patient/family provided with [facility] Clinical Social Work Department's list of facilities offering this level of care within the geographic area requested by the patient (or if unable, by the patient's family).  Yes   Patient/family informed of their freedom to choose among providers that offer the needed level of care, that participate in Medicare, Medicaid or managed care program needed by the patient, have an available bed and are willing to accept the patient.  Yes   Patient/family informed of [facility]'s ownership interest in [facility] and [facility], as well as of the fact that they are under no obligation to receive care at these facilities.  PASRR submitted to EDS on       PASRR number received on       Existing PASRR number confirmed on 01/16/17     FL2 transmitted to all facilities in geographic area requested by pt/family on 01/16/17     FL2 transmitted to all facilities within larger geographic area on       Patient informed that his/her managed care company has contracts with or will negotiate with certain facilities, including the following:            Patient/family informed of bed offers received.  Patient chooses bed at       Physician recommends and patient chooses bed at      Patient to be transferred to   on  .  Patient to be transferred to facility by       Patient family notified on   of transfer.  Name of family member notified:        PHYSICIAN       Additional Comment:    _______________________________________________ Jemiah [place], Darleen Crocker, LCSW 01/16/2017, 3:01 PM

## 2017-01-16 NOTE — H&P (Signed)
[facility] - [place] at [facility]   PATIENT NAME: Lisa Williams    MR#:  [id number]  DATE OF BIRTH:  01/29/1961  DATE OF ADMISSION:  01/16/2017  PRIMARY CARE PHYSICIAN: Lauro Regulus, MD   REQUESTING/REFERRING PHYSICIAN: Cyril Loosen  CHIEF COMPLAINT:   Chief Complaint  Patient presents with  . Arm Pain    HISTORY OF PRESENT ILLNESS: Aryel Edelen  is a 56 y.o. female with a known history of MS- walks with a walker, today morning, after her shower came out of bathroom ( Was not wet or no wet floor) felt dizzi ( no loss of consciousness or chest pain or palpitation) fell, and broke her left wrist. She walks with a walker and so not able to walk now, so suggested to admit. Ortho plans for surgery tomorrow.  PAST MEDICAL HISTORY:   Past Medical History:  Diagnosis Date  . Back pain   . Calculus of kidney 12/09/2013  . Chronic left shoulder pain 07/19/2015  . Galactorrhea 11/26/2014   Chronic   . Hereditary and idiopathic neuropathy 08/19/2013  . HPV test positive   . Hypercholesteremia 08/19/2013  . MS (multiple sclerosis) (HCC)   . Muscle spasticity 05/21/2014  . Status post laparoscopic supracervical hysterectomy 11/26/2014  . Tobacco user 11/26/2014  . Wrist fracture     PAST SURGICAL HISTORY: Past Surgical History:  Procedure Laterality Date  . ABDOMINAL HYSTERECTOMY    . bilateral tubal ligation  1996  . BREAST CYST EXCISION Left 2002  . KNEE SURGERY     right  . LAPAROSCOPIC SUPRACERVICAL HYSTERECTOMY  08/05/2013    SOCIAL HISTORY:  Social History  Substance Use Topics  . Smoking status: Former Smoker    Packs/day: 0.50    Types: Cigarettes  . Smokeless tobacco: Never Used  . Alcohol use No    FAMILY HISTORY:  Family History  Problem Relation Age of Onset  . Sickle cell trait Sister   . Supraventricular tachycardia Sister   . Diabetes Maternal Grandmother   . Liver cancer Maternal Grandmother   . Diabetes Maternal Aunt   . Breast  cancer Maternal Aunt        great MAT  . Ovarian cancer Paternal Grandmother   . Hypertension Father   . Hypertension Sister     DRUG ALLERGIES:  Allergies  Allergen Reactions  . Amlodipine Other (See Comments)    edema  . Povidone Iodine Rash    REVIEW OF SYSTEMS:   CONSTITUTIONAL: No fever, fatigue or weakness.  EYES: No blurred or double vision.  EARS, NOSE, AND THROAT: No tinnitus or ear pain.  RESPIRATORY: No cough, shortness of breath, wheezing or hemoptysis.  CARDIOVASCULAR: No chest pain, orthopnea, edema.  GASTROINTESTINAL: No nausea, vomiting, diarrhea or abdominal pain.  GENITOURINARY: No dysuria, hematuria.  ENDOCRINE: No polyuria, nocturia,  HEMATOLOGY: No anemia, easy bruising or bleeding SKIN: No rash or lesion. MUSCULOSKELETAL: left wrist pain.   NEUROLOGIC: No tingling, numbness, weakness.  PSYCHIATRY: No anxiety or depression.   MEDICATIONS AT HOME:  Prior to Admission medications   Medication Sig Start Date End Date Taking? Authorizing Provider  baclofen (LIORESAL) 20 MG tablet Take 20 mg by mouth 3 (three) times daily.    Yes [provider]  Calcium Carb-Cholecalciferol (CALCIUM 600+D) 600-800 MG-UNIT TABS Take 1 tablet by mouth daily.   Yes [provider]  losartan (COZAAR) 100 MG tablet Take 100 mg by mouth daily.  11/27/16 11/27/17 Yes [provider]  Multiple Vitamin (  MULTIVITAMIN) tablet Take 1 tablet by mouth daily.   Yes [provider]  oxybutynin (DITROPAN) 5 MG tablet Take 1 tablet (5 mg total) by mouth 2 (two) times daily. 03/27/16  Yes Milagros Loll, MD  RITUXIMAB IV  09/06/16  Yes [provider]  meloxicam (MOBIC) 15 MG tablet Take 1 tablet (15 mg total) by mouth 2 (two) times daily as needed for pain. 03/27/16   Milagros Loll, MD  tiZANidine (ZANAFLEX) 4 MG tablet Take 4 mg by mouth every 6 (six) hours as needed. 06/21/16   [provider]      PHYSICAL EXAMINATION:   VITAL SIGNS:  Blood pressure (!) 179/90, pulse 79, temperature 98.4 F (36.9 C), temperature source Oral, resp. rate 16, height  (1.803 m), weight 106.1 kg (234 lb), SpO2 100 %.  GENERAL:  56 y.o.-year-old patient lying in the bed with no acute distress.  EYES: Pupils equal, round, reactive to light and accommodation. No scleral icterus. Extraocular muscles intact.  HEENT: Head atraumatic, normocephalic. Oropharynx and nasopharynx clear.  NECK:  Supple, no jugular venous distention. No thyroid enlargement, no tenderness.  LUNGS: Normal breath sounds bilaterally, no wheezing, rales,rhonchi or crepitation. No use of accessory muscles of respiration.  CARDIOVASCULAR: S1, S2 normal. No murmurs, rubs, or gallops.  ABDOMEN: Soft, nontender, nondistended. Bowel sounds present. No organomegaly or mass.  EXTREMITIES: No pedal edema, cyanosis, or clubbing. Left wrist distally have swelling, tender. Pulses intact and able to move fingers. NEUROLOGIC: Cranial nerves II through XII are intact. Muscle strength 4/5 in lower extremities, not moving left UL much due to broken wrist. Sensation intact. Gait not checked.  PSYCHIATRIC: The patient is alert and oriented x 3.  SKIN: No obvious rash, lesion, or ulcer.   LABORATORY PANEL:   CBC  Recent Labs Lab 01/16/17 0924  WBC 8.8  HGB 12.7  HCT 36.9  PLT 203  MCV 79.5*  MCH 27.3  MCHC 34.3  RDW 14.2   ------------------------------------------------------------------------------------------------------------------  Chemistries   Recent Labs Lab 01/16/17 0924  NA 142  K 3.8  CL 112*  CO2 25  GLUCOSE 95  BUN 26*  CREATININE 0.61  CALCIUM 8.9  AST 18  ALT 14  ALKPHOS 99  BILITOT 0.6   ------------------------------------------------------------------------------------------------------------------ estimated creatinine clearance is 106.5 mL/min (by C-G formula based on SCr of 0.61  mg/dL). ------------------------------------------------------------------------------------------------------------------ No results for input(s): TSH, T4TOTAL, T3FREE, THYROIDAB in the last 72 hours.  Invalid input(s): FREET3   Coagulation profile  Recent Labs Lab 01/16/17 0924  INR 1.14   ------------------------------------------------------------------------------------------------------------------- No results for input(s): DDIMER in the last 72 hours. -------------------------------------------------------------------------------------------------------------------  Cardiac Enzymes No results for input(s): CKMB, TROPONINI, MYOGLOBIN in the last 168 hours.  Invalid input(s): CK ------------------------------------------------------------------------------------------------------------------ Invalid input(s): POCBNP  ---------------------------------------------------------------------------------------------------------------  Urinalysis    Component Value Date/Time   COLORURINE YELLOW (A) 10/02/2015 1656   APPEARANCEUR CLEAR (A) 10/02/2015 1656   APPEARANCEUR Cloudy 07/31/2011 1838   LABSPEC 1.017 10/02/2015 1656   LABSPEC 1.030 07/31/2011 1838   PHURINE 6.0 10/02/2015 1656   GLUCOSEU NEGATIVE 10/02/2015 1656   GLUCOSEU Negative 07/31/2011 1838   HGBUR 1+ (A) 10/02/2015 1656   BILIRUBINUR NEGATIVE 10/02/2015 1656   BILIRUBINUR Negative 07/31/2011 1838   KETONESUR TRACE (A) 10/02/2015 1656   PROTEINUR NEGATIVE 10/02/2015 1656   NITRITE NEGATIVE 10/02/2015 1656   LEUKOCYTESUR NEGATIVE 10/02/2015 1656   LEUKOCYTESUR Negative 07/31/2011 1838     RADIOLOGY: Dg Wrist Complete Left  Result Date: 01/16/2017 CLINICAL DATA:  Left wrist  pain after fall. EXAM: LEFT WRIST - COMPLETE 3+ VIEW COMPARISON:  None. FINDINGS: There is a comminuted, intra-articular fracture of the distal radius with dorsal angulation. Radiocarpal alignment and carpal alignment are preserved.  Displaced fracture of the ulnar styloid. Bone mineralization is normal. Diffuse soft tissue swelling about the wrist. IMPRESSION: 1. Comminuted, intra-articular fracture of the distal radius with dorsal angulation. 2. Displaced ulnar styloid fracture. Electronically Signed   By: Obie Dredge M.D.   On: 01/16/2017 09:21   Ct Wrist Left Wo Contrast  Result Date: 01/16/2017 CLINICAL DATA:  Fractures of the distal left radius and ulna. EXAM: CT OF THE LEFT WRIST WITHOUT CONTRAST TECHNIQUE: Multidetector CT imaging was performed according to the standard protocol. Multiplanar CT image reconstructions were also generated. COMPARISON:  Radiographs dated 01/16/2017 FINDINGS: Bones/Joint/Cartilage There is a severely comminuted fracture of the distal left radius with dorsal impaction and slight dorsal displacement of the distal fragments. There is a stellate component of the fracture involving the articular surface of the distal radius. There are several displaced and rotated cortical fragments. There is a tiny avulsion from the dorsal aspect of the distal ulna. IMPRESSION: 1. Comminuted impacted dorsally displaced fracture of the distal radius as described. 2. Tiny avulsion from the distal ulna. Electronically Signed   By: Francene Boyers M.D.   On: 01/16/2017 10:49   Dg Chest Port 1 View  Result Date: 01/16/2017 CLINICAL DATA:  Preop EXAM: PORTABLE CHEST 1 VIEW COMPARISON:  10/02/2015 FINDINGS: Heart is borderline in size. Lungs are clear. No effusions or edema. No acute bony abnormality. IMPRESSION: Borderline heart size.  No active disease. Electronically Signed   By: Charlett Nose M.D.   On: 01/16/2017 10:11    EKG: Orders placed or performed during the hospital encounter of 01/16/17  . ED EKG  . ED EKG    IMPRESSION AND PLAN:  * Left wrist fracture   Ortho consult   Pain management,.   As she walks with a walker, need PT eval after surgery.  * MS    In control.    On rituxan every 6 mths,  next dose in oct.     Cont with neuro clinic.     Cont muscle relaxers as need.  * Hypertension   Cont losartan   All the records are reviewed and case discussed with ED provider. Management plans discussed with the patient, family and they are in agreement.  CODE STATUS: Code Status History    Date Active Date Inactive Code Status Order ID Comments User Context   03/24/2016 12:13 PM 03/27/2016  9:20 PM Full Code [id number]  Adrian Saran, MD Inpatient   10/02/2015  7:59 PM 10/05/2015  5:30 PM Full Code [id number]  Adrian Saran, MD Inpatient   07/14/2015  4:05 PM 07/17/2015  9:20 PM Full Code [id number]  Enid Baas, MD Inpatient    Advance Directive Documentation     Most Recent Value  Type of Advance Directive  Living will  Pre-existing out of facility DNR order (yellow form or pink MOST form)  -  "MOST" Form in Place?  -       TOTAL TIME TAKING CARE OF THIS PATIENT: 45 minutes.    Altamese Dilling M.D on 01/16/2017   Between 7am to 6pm - Pager - [phone]  After 6pm go to [email or web address] - Social research officer, government  [facility]  Office  [phone]  CC: Primary care physician; Lauro Regulus, MD   Note: This dictation was prepared  with Dragon dictation along with smaller phrase technology. Any transcriptional errors that result from this process are unintentional.

## 2017-01-16 NOTE — ED Notes (Signed)
Wrist splint applied good pulses aftr

## 2017-01-16 NOTE — Care Management Obs Status (Signed)
MEDICARE OBSERVATION STATUS NOTIFICATION   Patient Details  Name: Lisa Williams MRN: [id number] Date of Birth: 02/12/1961   Medicare Observation Status Notification Given:  Yes    Marily Memos, RN 01/16/2017, 2:41 PM

## 2017-01-17 ENCOUNTER — Encounter: Payer: Self-pay | Admitting: Anesthesiology

## 2017-01-17 ENCOUNTER — Observation Stay: Payer: PPO | Admitting: Anesthesiology

## 2017-01-17 ENCOUNTER — Encounter
Admission: EM | Disposition: A | Discharge status: Skilled Nursing Facility | Payer: Self-pay | Source: Home / Self Care | Attending: Emergency Medicine

## 2017-01-17 DIAGNOSIS — S52612A Displaced fracture of left ulna styloid process, initial encounter for closed fracture: Secondary | ICD-10-CM | POA: Diagnosis not present

## 2017-01-17 DIAGNOSIS — R29898 Other symptoms and signs involving the musculoskeletal system: Secondary | ICD-10-CM | POA: Diagnosis not present

## 2017-01-17 DIAGNOSIS — S62102A Fracture of unspecified carpal bone, left wrist, initial encounter for closed fracture: Secondary | ICD-10-CM | POA: Diagnosis not present

## 2017-01-17 DIAGNOSIS — G35 Multiple sclerosis: Secondary | ICD-10-CM | POA: Diagnosis not present

## 2017-01-17 DIAGNOSIS — S52502A Unspecified fracture of the lower end of left radius, initial encounter for closed fracture: Secondary | ICD-10-CM | POA: Diagnosis not present

## 2017-01-17 DIAGNOSIS — I10 Essential (primary) hypertension: Secondary | ICD-10-CM | POA: Diagnosis not present

## 2017-01-17 HISTORY — PX: ORIF WRIST FRACTURE: SHX2133

## 2017-01-17 LAB — BASIC METABOLIC PANEL
ANION GAP: 5 (ref 5–15)
BUN: 23 mg/dL — ABNORMAL HIGH (ref 6–20)
CALCIUM: 8.8 mg/dL — AB (ref 8.9–10.3)
CO2: 27 mmol/L (ref 22–32)
Chloride: 110 mmol/L (ref 101–111)
Creatinine, Ser: 0.62 mg/dL (ref 0.44–1.00)
Glucose, Bld: 99 mg/dL (ref 65–99)
POTASSIUM: 3.9 mmol/L (ref 3.5–5.1)
SODIUM: 142 mmol/L (ref 135–145)

## 2017-01-17 LAB — CBC
HEMATOCRIT: 36.3 % (ref 35.0–47.0)
Hemoglobin: 12.6 g/dL (ref 12.0–16.0)
MCH: 27.7 pg (ref 26.0–34.0)
MCHC: 34.8 g/dL (ref 32.0–36.0)
MCV: 79.5 fL — AB (ref 80.0–100.0)
PLATELETS: 195 10*3/uL (ref 150–440)
RBC: 4.56 MIL/uL (ref 3.80–5.20)
RDW: 13.8 % (ref 11.5–14.5)
WBC: 8.1 10*3/uL (ref 3.6–11.0)

## 2017-01-17 SURGERY — OPEN REDUCTION INTERNAL FIXATION (ORIF) WRIST FRACTURE
Anesthesia: General | Site: Wrist | Laterality: Left | Wound class: Clean

## 2017-01-17 MED ORDER — DOCUSATE SODIUM 100 MG PO CAPS
100.0000 mg | ORAL_CAPSULE | Freq: Two times a day (BID) | ORAL | Status: DC
  Administered 2017-01-18: 100 mg via ORAL
  Filled 2017-01-17 (×2): qty 1

## 2017-01-17 MED ORDER — ONDANSETRON HCL 4 MG/2ML IJ SOLN
INTRAMUSCULAR | Status: AC
  Filled 2017-01-17: qty 2

## 2017-01-17 MED ORDER — FENTANYL CITRATE (PF) 100 MCG/2ML IJ SOLN
INTRAMUSCULAR | Status: AC
Start: 2017-01-17 — End: 2017-01-17
  Filled 2017-01-17: qty 2

## 2017-01-17 MED ORDER — SUCCINYLCHOLINE CHLORIDE 20 MG/ML IJ SOLN
INTRAMUSCULAR | Status: AC
  Filled 2017-01-17: qty 1

## 2017-01-17 MED ORDER — ACETAMINOPHEN 10 MG/ML IV SOLN
INTRAVENOUS | Status: AC
  Filled 2017-01-17: qty 100

## 2017-01-17 MED ORDER — PROPOFOL 10 MG/ML IV BOLUS
INTRAVENOUS | Status: DC | PRN
  Administered 2017-01-17: 180 mg via INTRAVENOUS

## 2017-01-17 MED ORDER — HYDROCODONE-ACETAMINOPHEN 7.5-325 MG PO TABS: 1.0000 | ORAL_TABLET | ORAL | Status: DC | PRN

## 2017-01-17 MED ORDER — LIDOCAINE HCL 2 % EX GEL
CUTANEOUS | Status: AC
  Filled 2017-01-17: qty 5

## 2017-01-17 MED ORDER — PROPOFOL 10 MG/ML IV BOLUS
INTRAVENOUS | Status: AC
  Filled 2017-01-17: qty 40

## 2017-01-17 MED ORDER — LIDOCAINE HCL (PF) 2 % IJ SOLN
INTRAMUSCULAR | Status: AC
  Filled 2017-01-17: qty 6

## 2017-01-17 MED ORDER — LACTATED RINGERS IV SOLN
INTRAVENOUS | Status: DC | PRN
  Administered 2017-01-17: 08:00:00 via INTRAVENOUS

## 2017-01-17 MED ORDER — ONDANSETRON HCL 4 MG/2ML IJ SOLN
INTRAMUSCULAR | Status: DC | PRN
  Administered 2017-01-17: 4 mg via INTRAVENOUS

## 2017-01-17 MED ORDER — FENTANYL CITRATE (PF) 100 MCG/2ML IJ SOLN
INTRAMUSCULAR | Status: DC | PRN
  Administered 2017-01-17 (×2): 50 ug via INTRAVENOUS
  Administered 2017-01-17: 100 ug via INTRAVENOUS
  Administered 2017-01-17: 50 ug via INTRAVENOUS
  Administered 2017-01-17: 25 ug via INTRAVENOUS
  Administered 2017-01-17: 50 ug via INTRAVENOUS
  Administered 2017-01-17: 25 ug via INTRAVENOUS

## 2017-01-17 MED ORDER — FENTANYL CITRATE (PF) 100 MCG/2ML IJ SOLN
INTRAMUSCULAR | Status: DC
Start: 2017-01-17 — End: 2017-01-17
  Filled 2017-01-17: qty 2

## 2017-01-17 MED ORDER — POLYETHYLENE GLYCOL 3350 17 G PO PACK: 17.0000 g | PACK | Freq: Every day | ORAL | Status: DC | PRN

## 2017-01-17 MED ORDER — BUPIVACAINE-EPINEPHRINE (PF) 0.25% -1:200000 IJ SOLN
INTRAMUSCULAR | Status: AC
  Filled 2017-01-17: qty 30

## 2017-01-17 MED ORDER — FLEET ENEMA 7-19 GM/118ML RE ENEM: 1.0000 | ENEMA | Freq: Once | RECTAL | Status: DC | PRN

## 2017-01-17 MED ORDER — PHENYLEPHRINE HCL 10 MG/ML IJ SOLN
INTRAMUSCULAR | Status: DC | PRN
  Administered 2017-01-17 (×2): 100 ug via INTRAVENOUS

## 2017-01-17 MED ORDER — FENTANYL CITRATE (PF) 100 MCG/2ML IJ SOLN: 25.0000 ug | INTRAMUSCULAR | Status: DC | PRN

## 2017-01-17 MED ORDER — SUGAMMADEX SODIUM 200 MG/2ML IV SOLN
INTRAVENOUS | Status: AC
  Filled 2017-01-17: qty 2

## 2017-01-17 MED ORDER — ACETAMINOPHEN 325 MG PO TABS
650.0000 mg | ORAL_TABLET | Freq: Four times a day (QID) | ORAL | Status: DC | PRN
  Administered 2017-01-18: 650 mg via ORAL
  Filled 2017-01-17: qty 2

## 2017-01-17 MED ORDER — SENNA 8.6 MG PO TABS
1.0000 | ORAL_TABLET | Freq: Two times a day (BID) | ORAL | Status: DC
  Administered 2017-01-18: 8.6 mg via ORAL
  Filled 2017-01-17 (×2): qty 1

## 2017-01-17 MED ORDER — SODIUM CHLORIDE 0.9 % IJ SOLN
INTRAMUSCULAR | Status: DC | PRN
  Administered 2017-01-17: 10 mL via INTRAVENOUS

## 2017-01-17 MED ORDER — SODIUM CHLORIDE 0.9 % IR SOLN
Status: DC | PRN
  Administered 2017-01-17: 10 mL

## 2017-01-17 MED ORDER — ROCURONIUM BROMIDE 50 MG/5ML IV SOLN
INTRAVENOUS | Status: AC
  Filled 2017-01-17: qty 1

## 2017-01-17 MED ORDER — ONDANSETRON HCL 4 MG PO TABS: 4.0000 mg | ORAL_TABLET | Freq: Four times a day (QID) | ORAL | Status: DC | PRN

## 2017-01-17 MED ORDER — LIDOCAINE HCL (PF) 1 % IJ SOLN
INTRAMUSCULAR | Status: AC
  Filled 2017-01-17: qty 5

## 2017-01-17 MED ORDER — BISACODYL 10 MG RE SUPP: 10.0000 mg | Freq: Every day | RECTAL | Status: DC | PRN

## 2017-01-17 MED ORDER — ACETAMINOPHEN 650 MG RE SUPP: 650.0000 mg | Freq: Four times a day (QID) | RECTAL | Status: DC | PRN

## 2017-01-17 MED ORDER — DEXAMETHASONE SODIUM PHOSPHATE 10 MG/ML IJ SOLN
INTRAMUSCULAR | Status: AC
  Filled 2017-01-17: qty 1

## 2017-01-17 MED ORDER — MIDAZOLAM HCL 2 MG/2ML IJ SOLN
INTRAMUSCULAR | Status: DC | PRN
  Administered 2017-01-17: 2 mg via INTRAVENOUS

## 2017-01-17 MED ORDER — METOCLOPRAMIDE HCL 10 MG PO TABS: 5.0000 mg | ORAL_TABLET | Freq: Three times a day (TID) | ORAL | Status: DC | PRN

## 2017-01-17 MED ORDER — MIDAZOLAM HCL 2 MG/2ML IJ SOLN
INTRAMUSCULAR | Status: AC
  Filled 2017-01-17: qty 2

## 2017-01-17 MED ORDER — ONDANSETRON HCL 4 MG/2ML IJ SOLN: 4.0000 mg | Freq: Once | INTRAMUSCULAR | Status: DC | PRN

## 2017-01-17 MED ORDER — ROPIVACAINE HCL 5 MG/ML IJ SOLN
INTRAMUSCULAR | Status: AC
  Filled 2017-01-17: qty 30

## 2017-01-17 MED ORDER — DEXAMETHASONE SODIUM PHOSPHATE 10 MG/ML IJ SOLN
INTRAMUSCULAR | Status: DC | PRN
  Administered 2017-01-17: 10 mg via INTRAVENOUS

## 2017-01-17 MED ORDER — ONDANSETRON HCL 4 MG/2ML IJ SOLN
4.0000 mg | Freq: Once | INTRAMUSCULAR | Status: DC | PRN
Start: 2017-01-17 — End: 2017-01-17

## 2017-01-17 MED ORDER — ONDANSETRON HCL 4 MG/2ML IJ SOLN
4.0000 mg | Freq: Four times a day (QID) | INTRAMUSCULAR | Status: DC | PRN
Start: 2017-01-17 — End: 2017-01-18
  Administered 2017-01-17: 4 mg via INTRAVENOUS
  Filled 2017-01-17: qty 2

## 2017-01-17 MED ORDER — GLYCOPYRROLATE 0.2 MG/ML IJ SOLN
INTRAMUSCULAR | Status: AC
  Filled 2017-01-17: qty 1

## 2017-01-17 MED ORDER — METOCLOPRAMIDE HCL 5 MG/ML IJ SOLN: 5.0000 mg | Freq: Three times a day (TID) | INTRAMUSCULAR | Status: DC | PRN

## 2017-01-17 MED ORDER — LABETALOL HCL 5 MG/ML IV SOLN
INTRAVENOUS | Status: AC
  Administered 2017-01-17: 10 mg via INTRAVENOUS
  Filled 2017-01-17: qty 4

## 2017-01-17 MED ORDER — LACTATED RINGERS IV SOLN
INTRAVENOUS | Status: DC
  Administered 2017-01-17 – 2017-01-18 (×2): via INTRAVENOUS

## 2017-01-17 MED ORDER — ROCURONIUM BROMIDE 100 MG/10ML IV SOLN
INTRAVENOUS | Status: DC | PRN
  Administered 2017-01-17: 30 mg via INTRAVENOUS
  Administered 2017-01-17 (×5): 10 mg via INTRAVENOUS
  Administered 2017-01-17: 20 mg via INTRAVENOUS

## 2017-01-17 MED ORDER — KETOROLAC TROMETHAMINE 30 MG/ML IJ SOLN
INTRAMUSCULAR | Status: DC | PRN
  Administered 2017-01-17: 30 mg via INTRAVENOUS

## 2017-01-17 MED ORDER — ACETAMINOPHEN 10 MG/ML IV SOLN
INTRAVENOUS | Status: DC | PRN
  Administered 2017-01-17: 1000 mg via INTRAVENOUS

## 2017-01-17 MED ORDER — FENTANYL CITRATE (PF) 250 MCG/5ML IJ SOLN
INTRAMUSCULAR | Status: AC
Start: 2017-01-17 — End: 2017-01-17
  Filled 2017-01-17: qty 5

## 2017-01-17 MED ORDER — LIDOCAINE HCL (CARDIAC) 20 MG/ML IV SOLN
INTRAVENOUS | Status: DC | PRN
  Administered 2017-01-17: 100 mg via INTRAVENOUS

## 2017-01-17 MED ORDER — EPHEDRINE SULFATE 50 MG/ML IJ SOLN
INTRAMUSCULAR | Status: AC
  Filled 2017-01-17: qty 1

## 2017-01-17 MED ORDER — PHENYLEPHRINE HCL 10 MG/ML IJ SOLN
INTRAMUSCULAR | Status: AC
  Filled 2017-01-17: qty 1

## 2017-01-17 MED ORDER — KETOROLAC TROMETHAMINE 15 MG/ML IJ SOLN
15.0000 mg | Freq: Four times a day (QID) | INTRAMUSCULAR | Status: AC
  Administered 2017-01-17 (×2): 15 mg via INTRAVENOUS
  Filled 2017-01-17 (×2): qty 1

## 2017-01-17 MED ORDER — LABETALOL HCL 5 MG/ML IV SOLN
10.0000 mg | INTRAVENOUS | Status: AC | PRN
  Administered 2017-01-17 (×2): 10 mg via INTRAVENOUS

## 2017-01-17 MED ORDER — KETOROLAC TROMETHAMINE 30 MG/ML IJ SOLN
INTRAMUSCULAR | Status: AC
  Filled 2017-01-17: qty 1

## 2017-01-17 MED ORDER — BUPIVACAINE-EPINEPHRINE (PF) 0.25% -1:200000 IJ SOLN
INTRAMUSCULAR | Status: DC | PRN
  Administered 2017-01-17: 10 mL via PERINEURAL

## 2017-01-17 SURGICAL SUPPLY — 53 items
BANDAGE ELASTIC 2 CLIP ST LF (GAUZE/BANDAGES/DRESSINGS) ×3 IMPLANT
BANDAGE ELASTIC 3 CLIP ST LF (GAUZE/BANDAGES/DRESSINGS) ×3 IMPLANT
BIT DRILL 2.2 SS TIBIAL (BIT) ×3 IMPLANT
BNDG ESMARK 4X12 TAN STRL LF (GAUZE/BANDAGES/DRESSINGS) ×3 IMPLANT
BRUSH SCRUB EZ  4% CHG (MISCELLANEOUS) ×4
BRUSH SCRUB EZ 4% CHG (MISCELLANEOUS) ×2 IMPLANT
CANISTER SUCT 1200ML W/VALVE (MISCELLANEOUS) ×3 IMPLANT
CAST PADDING 3X4FT ST 30246 (SOFTGOODS) ×2
CHLORAPREP W/TINT 26ML (MISCELLANEOUS) ×6 IMPLANT
CORD BIP STRL DISP 12FT (MISCELLANEOUS) ×3 IMPLANT
CUFF TOURN 18 STER (MISCELLANEOUS) ×3 IMPLANT
CUFF TOURN 24 STER (MISCELLANEOUS) IMPLANT
DECANTER SPIKE VIAL GLASS SM (MISCELLANEOUS) ×3 IMPLANT
DRAPE FLUOR MINI C-ARM 54X84 (DRAPES) ×3 IMPLANT
DRAPE SHEET LG 3/4 BI-LAMINATE (DRAPES) ×3 IMPLANT
DRAPE SURG 17X11 SM STRL (DRAPES) ×3 IMPLANT
ELECT REM PT RETURN 9FT ADLT (ELECTROSURGICAL) ×3
ELECTRODE REM PT RTRN 9FT ADLT (ELECTROSURGICAL) ×1 IMPLANT
FORCEPS JEWEL BIP 4-3/4 STR (INSTRUMENTS) ×3 IMPLANT
GAUZE PETRO XEROFOAM 1X8 (MISCELLANEOUS) ×3 IMPLANT
GAUZE SPONGE 4X4 12PLY STRL (GAUZE/BANDAGES/DRESSINGS) ×3 IMPLANT
GLOVE INDICATOR 8.0 STRL GRN (GLOVE) ×3 IMPLANT
GLOVE SURG ORTHO 8.0 STRL STRW (GLOVE) ×6 IMPLANT
GOWN STRL REUS W/ TWL LRG LVL3 (GOWN DISPOSABLE) ×1 IMPLANT
GOWN STRL REUS W/ TWL XL LVL3 (GOWN DISPOSABLE) ×1 IMPLANT
GOWN STRL REUS W/TWL LRG LVL3 (GOWN DISPOSABLE) ×2
GOWN STRL REUS W/TWL XL LVL3 (GOWN DISPOSABLE) ×2
K-WIRE 1.6 (WIRE) ×2
K-WIRE FX5X1.6XNS BN SS (WIRE) ×1
KIT RM TURNOVER STRD PROC AR (KITS) ×3 IMPLANT
KWIRE FX5X1.6XNS BN SS (WIRE) ×1 IMPLANT
NS IRRIG 1000ML POUR BTL (IV SOLUTION) ×3 IMPLANT
PACK EXTREMITY ARMC (MISCELLANEOUS) ×3 IMPLANT
PAD ABD DERMACEA PRESS 5X9 (GAUZE/BANDAGES/DRESSINGS) ×3 IMPLANT
PAD CAST CTTN 3X4 STRL (SOFTGOODS) ×1 IMPLANT
PEG LOCKING SMOOTH 2.2X14 (Peg) ×3 IMPLANT
PEG LOCKING SMOOTH 2.2X18 (Peg) ×3 IMPLANT
PEG LOCKING SMOOTH 2.2X20 (Screw) ×3 IMPLANT
PEG LOCKING SMOOTH 2.2X22 (Screw) ×3 IMPLANT
PLATE STD DVR LEFT (Plate) ×3 IMPLANT
PLATE STD DVR LT 24X55 (Plate) ×1 IMPLANT
SCREW 2.7X12MM (Screw) ×3 IMPLANT
SCREW LOCK 14X2.7X 3 LD TPR (Screw) ×1 IMPLANT
SCREW LOCK 18X2.7X 3 LD TPR (Screw) ×1 IMPLANT
SCREW LOCKING 2.7X14 (Screw) ×2 IMPLANT
SCREW LOCKING 2.7X18 (Screw) ×2 IMPLANT
SCREW NONLOCK 2.7X20MM (Screw) ×6 IMPLANT
SPLINT CAST 1 STEP 3X12 (MISCELLANEOUS) ×3 IMPLANT
STAPLER SKIN PROX 35W (STAPLE) ×3 IMPLANT
SUT ETHILON 3 0 PS 1 (SUTURE) ×3 IMPLANT
SUT VIC AB 3-0 SH 27 (SUTURE) ×4
SUT VIC AB 3-0 SH 27X BRD (SUTURE) ×2 IMPLANT
TOWEL OR 17X26 4PK STRL BLUE (TOWEL DISPOSABLE) ×3 IMPLANT

## 2017-01-17 NOTE — Progress Notes (Signed)
Per patient's mother Lisa Williams patient gets Rituximab at the [facility] in [place] and does not have that medicine at home. Clinical Social Worker (CSW) contacted the [facility] in [place] and spoke with [place] who stated that patient's due for the next does of Rituximab on October, 31st, 2018 at 8:45 am at the [facility] in [place]. CSW contacted [facility] admissions coordinator at [facility] and made her aware of above. Per Marcelino Duster patient can come to [facility] once [employer] authorization is received.   [employer], LCSW [phone]

## 2017-01-17 NOTE — Anesthesia Preprocedure Evaluation (Addendum)
Anesthesia Evaluation  Patient identified by MRN, date of birth, ID band Patient awake    Reviewed: Allergy & Precautions, NPO status   Airway Mallampati: II       Dental no notable dental hx.    Pulmonary former smoker,    Pulmonary exam normal        Cardiovascular negative cardio ROS Normal cardiovascular exam     Neuro/Psych  Neuromuscular disease    GI/Hepatic negative GI ROS, Neg liver ROS,   Endo/Other  negative endocrine ROS  Renal/GU stones  negative genitourinary   Musculoskeletal negative musculoskeletal ROS (+)   Abdominal Normal abdominal exam  (+)   Peds negative pediatric ROS (+)  Hematology negative hematology ROS (+)   Anesthesia Other Findings   Reproductive/Obstetrics                             Anesthesia Physical Anesthesia Plan  ASA: III  Anesthesia Plan: General   Post-op Pain Management:    Induction: Intravenous  PONV Risk Score and Plan:   Airway Management Planned: Oral ETT  Additional Equipment:   Intra-op Plan:   Post-operative Plan: Extubation in OR  Informed Consent: I have reviewed the patients History and Physical, chart, labs and discussed the procedure including the risks, benefits and alternatives for the proposed anesthesia with the patient or authorized representative who has indicated his/her understanding and acceptance.   Dental advisory given  Plan Discussed with: CRNA  Anesthesia Plan Comments: (Patient does not want post op regional block)       Anesthesia Quick Evaluation

## 2017-01-17 NOTE — Anesthesia Post-op Follow-up Note (Signed)
Anesthesia QCDR form completed.        

## 2017-01-17 NOTE — Transfer of Care (Signed)
Immediate Anesthesia Transfer of Care Note  Patient: Lisa Williams  Procedure(s) Performed: Procedure(s): OPEN REDUCTION INTERNAL FIXATION (ORIF) WRIST FRACTURE (Left)  Patient Location: PACU  Anesthesia Type:General  Level of Consciousness: sedated  Airway & Oxygen Therapy: Patient Spontanous Breathing and Patient connected to face mask oxygen  Post-op Assessment: Report given to RN and Post -op Vital signs reviewed and stable  Post vital signs: Reviewed and stable  Last Vitals:  Vitals:   01/16/17 1918 01/17/17 1056  BP: (!) 141/69 (!) 182/109  Pulse: 84 (!) 119  Resp: 19 20  Temp: 37 C 36.8 C  SpO2: 99% 96%    Complications: No apparent anesthesia complications

## 2017-01-17 NOTE — Anesthesia Postprocedure Evaluation (Signed)
Anesthesia Post Note  Patient: Lisa Williams  Procedure(s) Performed: Procedure(s) (LRB): OPEN REDUCTION INTERNAL FIXATION (ORIF) WRIST FRACTURE (Left)  Patient location during evaluation: PACU Anesthesia Type: General Level of consciousness: awake and alert and oriented Pain management: pain level controlled Vital Signs Assessment: post-procedure vital signs reviewed and stable Respiratory status: spontaneous breathing Cardiovascular status: blood pressure returned to baseline Postop Assessment: no headache Anesthetic complications: no     Last Vitals:  Vitals:   01/17/17 1402 01/17/17 1513  BP: (!) 144/70 (!) 146/72  Pulse: 90 83  Resp: 18 18  Temp: 36.8 C 37 C  SpO2: 98% 100%    Last Pain:  Vitals:   01/17/17 1513  TempSrc: Oral  PainSc:                  Khalil Szczepanik

## 2017-01-17 NOTE — Progress Notes (Signed)
Lisa Williams admissions coordinator at [facility] asked Clinical Social Worker (CSW) to ask patient and her family if she can bring her Rituximab from home because it is a very expensive medicine. CSW contacted patient's mother Lisa Williams and made her aware of above. Per mother she is going to look into this and call CSW back.   [employer], LCSW [phone]

## 2017-01-17 NOTE — Progress Notes (Signed)
[facility] - [place] at [facility]   PATIENT NAME: Lisa Williams    MR#:  [id number]  DATE OF BIRTH:  05-27-1960  SUBJECTIVE:  CHIEF COMPLAINT:   Chief Complaint  Patient presents with  . Arm Pain     Came with wrist fracture, had surgery today- vomited and nausea since then. REVIEW OF SYSTEMS:  CONSTITUTIONAL: No fever, fatigue or weakness.  EYES: No blurred or double vision.  EARS, NOSE, AND THROAT: No tinnitus or ear pain.  RESPIRATORY: No cough, shortness of breath, wheezing or hemoptysis.  CARDIOVASCULAR: No chest pain, orthopnea, edema.  GASTROINTESTINAL: No nausea, vomiting, diarrhea or abdominal pain.  GENITOURINARY: No dysuria, hematuria.  ENDOCRINE: No polyuria, nocturia,  HEMATOLOGY: No anemia, easy bruising or bleeding SKIN: No rash or lesion. MUSCULOSKELETAL: No joint pain or arthritis.   NEUROLOGIC: No tingling, numbness, weakness.  PSYCHIATRY: No anxiety or depression.   ROS  DRUG ALLERGIES:   Allergies  Allergen Reactions  . Amlodipine Other (See Comments)    edema  . Povidone Iodine Rash    VITALS:  Blood pressure (!) 146/72, pulse 83, temperature 98.6 F (37 C), temperature source Oral, resp. rate 18, height 5\' 11"  (1.803 m), weight 106.1 kg (234 lb), SpO2 100 %.  PHYSICAL EXAMINATION:  GENERAL:  56 y.o.-year-old patient lying in the bed with no acute distress.  EYES: Pupils equal, round, reactive to light and accommodation. No scleral icterus. Extraocular muscles intact.  HEENT: Head atraumatic, normocephalic. Oropharynx and nasopharynx clear.  NECK:  Supple, no jugular venous distention. No thyroid enlargement, no tenderness.  LUNGS: Normal breath sounds bilaterally, no wheezing, rales,rhonchi or crepitation. No use of accessory muscles of respiration.  CARDIOVASCULAR: S1, S2 normal. No murmurs, rubs, or gallops.  ABDOMEN: Soft, nontender, nondistended. Bowel sounds present. No organomegaly or mass.  EXTREMITIES: No pedal  edema, cyanosis, or clubbing. Left wrist dressing and cast present. NEUROLOGIC: Cranial nerves II through XII are intact. Muscle strength 5/5 in upper extremities 4/5 in lower extremities. Sensation intact. Gait not checked.  PSYCHIATRIC: The patient is alert and oriented x 3.  SKIN: No obvious rash, lesion, or ulcer.   Physical Exam LABORATORY PANEL:   CBC  Recent Labs Lab 01/17/17 0452  WBC 8.1  HGB 12.6  HCT 36.3  PLT 195   ------------------------------------------------------------------------------------------------------------------  Chemistries   Recent Labs Lab 01/16/17 0924 01/17/17 0452  NA 142 142  K 3.8 3.9  CL 112* 110  CO2 25 27  GLUCOSE 95 99  BUN 26* 23*  CREATININE 0.61 0.62  CALCIUM 8.9 8.8*  AST 18  --   ALT 14  --   ALKPHOS 99  --   BILITOT 0.6  --    ------------------------------------------------------------------------------------------------------------------  Cardiac Enzymes No results for input(s): TROPONINI in the last 168 hours. ------------------------------------------------------------------------------------------------------------------  RADIOLOGY:  Dg Wrist Complete Left  Result Date: 01/16/2017 CLINICAL DATA:  Left wrist pain after fall. EXAM: LEFT WRIST - COMPLETE 3+ VIEW COMPARISON:  None. FINDINGS: There is a comminuted, intra-articular fracture of the distal radius with dorsal angulation. Radiocarpal alignment and carpal alignment are preserved. Displaced fracture of the ulnar styloid. Bone mineralization is normal. Diffuse soft tissue swelling about the wrist. IMPRESSION: 1. Comminuted, intra-articular fracture of the distal radius with dorsal angulation. 2. Displaced ulnar styloid fracture. Electronically Signed   By: Obie Dredge M.D.   On: 01/16/2017 09:21   Ct Wrist Left Wo Contrast  Result Date: 01/16/2017 CLINICAL DATA:  Fractures of the distal left radius and  ulna. EXAM: CT OF THE LEFT WRIST WITHOUT CONTRAST  TECHNIQUE: Multidetector CT imaging was performed according to the standard protocol. Multiplanar CT image reconstructions were also generated. COMPARISON:  Radiographs dated 01/16/2017 FINDINGS: Bones/Joint/Cartilage There is a severely comminuted fracture of the distal left radius with dorsal impaction and slight dorsal displacement of the distal fragments. There is a stellate component of the fracture involving the articular surface of the distal radius. There are several displaced and rotated cortical fragments. There is a tiny avulsion from the dorsal aspect of the distal ulna. IMPRESSION: 1. Comminuted impacted dorsally displaced fracture of the distal radius as described. 2. Tiny avulsion from the distal ulna. Electronically Signed   By: Francene Boyers M.D.   On: 01/16/2017 10:49   Dg Chest Port 1 View  Result Date: 01/16/2017 CLINICAL DATA:  Preop EXAM: PORTABLE CHEST 1 VIEW COMPARISON:  10/02/2015 FINDINGS: Heart is borderline in size. Lungs are clear. No effusions or edema. No acute bony abnormality. IMPRESSION: Borderline heart size.  No active disease. Electronically Signed   By: Charlett Nose M.D.   On: 01/16/2017 10:11    ASSESSMENT AND PLAN:   Principal Problem:   Wrist fracture  * Left wrist fracture- s/p surgery.   Ortho consult   Pain management,.   As she walks with a walker, need PT eval after surgery.  * MS     In control.     On rituxan every 6 mths, next dose in oct.     Cont with neuro clinic.     Cont muscle relaxers as need.  * Hypertension    Cont losartan      All the records are reviewed and case discussed with Care Management/Social Workerr. Management plans discussed with the patient, family and they are in agreement.  CODE STATUS: full.  TOTAL TIME TAKING CARE OF THIS PATIENT: 35 minutes.    POSSIBLE D/C IN 1-2 DAYS, DEPENDING ON CLINICAL CONDITION.   Altamese Dilling M.D on 01/17/2017   Between 7am to 6pm - Pager -  [phone]  After 6pm go to [email or web address] - Social research officer, government  [facility]  Office  [phone]  CC: Primary care physician; Lauro Regulus, MD  Note: This dictation was prepared with Dragon dictation along with smaller phrase technology. Any transcriptional errors that result from this process are unintentional.

## 2017-01-17 NOTE — Anesthesia Procedure Notes (Signed)
Procedure Name: Intubation Date/Time: 01/17/2017 7:57 AM Performed by: Doreen Salvage Pre-anesthesia Checklist: Patient identified, Patient being monitored, Timeout performed, Emergency Drugs available and Suction available Patient Re-evaluated:Patient Re-evaluated prior to induction Oxygen Delivery Method: Circle system utilized Preoxygenation: Pre-oxygenation with 100% oxygen Induction Type: IV induction Ventilation: Mask ventilation without difficulty Laryngoscope Size: Mac and 3 Grade View: Grade I Tube type: Oral Tube size: 7.0 mm Number of attempts: 1 Airway Equipment and Method: Stylet Placement Confirmation: ETT inserted through vocal cords under direct vision,  positive ETCO2 and breath sounds checked- equal and bilateral Secured at: 21 cm Tube secured with: Tape Dental Injury: Teeth and Oropharynx as per pre-operative assessment

## 2017-01-17 NOTE — Progress Notes (Signed)
PT Cancellation Note  Patient Details Name: Lisa Williams MRN: [id number] DOB: August 17, 1960   Cancelled Treatment:    Reason Eval/Treat Not Completed: Other (comment).  This PT introduced herself to the pt and spoke with the pt briefly.  Pt with emesis x3.  RN notified.  Will hold PT and will attempt to see pt again 9/13.  Thank you for this order.   Encarnacion Chu PT, DPT 01/17/2017, 1:52 PM

## 2017-01-17 NOTE — Progress Notes (Signed)
PT Cancellation Note  Patient Details Name: Nylaya Corprew Cuthrell MRN: [id number] DOB: 19-Aug-1960   Cancelled Treatment:    Reason Eval/Treat Not Completed: Patient at procedure or test/unavailable (Patient currently off unit for L wrist surgery.  Will require new orders post-op.  Please re-order as medically appropriate.)  Sharis Keeran H. Manson Passey, PT, DPT, NCS 01/17/17, 8:08 AM [phone]

## 2017-01-17 NOTE — Progress Notes (Signed)
OT Cancellation Note  Patient Details Name: Saveena Vosburgh Demartin MRN: [id number] DOB: Dec 05, 1960   Cancelled Treatment:    Reason Eval/Treat Not Completed: Medical issues which prohibited therapy. Order received, chart reviewed. Pt with N/V this afternoon. Will hold OT evaluation this date and re-attempt next morning as pt is medically appropriate and able to participate.  Richrd Prime, MPH, MS, OTR/L ascom [phone] 01/17/17, 4:17 PM

## 2017-01-17 NOTE — Op Note (Signed)
01/16/2017 - 01/17/2017  11:07 AM  PATIENT:  Lisa Williams    PRE-OPERATIVE DIAGNOSIS:  left wrist fracture  POST-OPERATIVE DIAGNOSIS:  Same  PROCEDURE: LEFT OPEN REDUCTION INTERNAL FIXATION (ORIF) WRIST FRACTURE  SURGEON:  Lyndle Herrlich, MD  TOURNIQUET TIME:  106  MIN at 250 mmHg  ANESTHESIA:   General  PREOPERATIVE INDICATIONS:  Lisa Williams is a  56 y.o. female with a diagnosis of left wrist fracture who failed conservative measures and elected for surgical management.    The risks benefits and alternatives were discussed with the patient preoperatively including but not limited to the risks of infection, bleeding, nerve injury, malunion, nonunion, wrist stiffness, persistent wrist pain, osteoarthritis and the need for further surgery. Medical risks include but are not limited to DVT and pulmonary embolism, myocardial infarction, stroke, pneumonia, respiratory failure and death. Patient  understood these risks and wished to proceed.   OPERATIVE IMPLANTS:  hand innovations plate  OPERATIVE FINDINGS: Severe comminution with dorsal spike  OPERATIVE PROCEDURE: Patient was seen in the preoperative area. I marked the operative wrist according tl the hospital's correct site of surgery protocol. Patient was then brought to the operating roomand was placed supine on the operative table and underwent general anesthesia with an LMA.   The operative arm was prepped and draped in a sterile fashion. A timeout performed to verify the patient's name, date of birth, medical record number, correct site of surgery correct procedure to be performed. The timeout was also used a timeout to verify patient received antibiotics and appropriate instruments, implants and radiographs studies were available in the room. Once all in attendance were in agreement case began.   Patient then had the operative extremity exsanguinated with an Esmarch. The tourniquet was placed on the upper  extremity and inflated 250 mm.  A manual reduction of the fracture was performed. The fracture reduction was confirmed on FluoroScan imaging.  A linear incision was then made over the FCR tendon. The subcutaneous tissue was carefully dissected using Metzenbaum scissor and Adson pickup. Retractors were used to protect the radial artery and median nerve. The pronator quadratus was identified and incised and elevated off the volar surface of the distal radius. A [employer] volar plate was then positioned on the under surface of the distal radius. It was held into position with a K wire. The position of the plate was confirmed on AP and lateral images. Once the plate was in good position a cortical screw was placed bicortically in the sliding hole. Attention was then turned to the distal pegs. The proximal row of pegs was placed first. Each individual peg hole was drilled and then measured with a depth gauge. The distal row was then drilled and smooth pegs were placed. The position and length of all screws were confirmed on AP and lateral FluoroScan imaging. Care was taken to avoid penetration of any peg through the articular surface of the distal radius.  Once all distal pegs were placed, the attention was turned back to placement of bicortical shaft screws. Additional screws were placed in the plate to fill the remaining holes. The dorsal spike was then addressed through a 4 cm dorsal incision in line with the 3rd metacarpal. The EPL was identified and protected. The dorsal fragment was reduced and the retinaculum closed with 3-0 vicryl. The wound was then copiously irrigated. Final FluoroScan imaging of the construct were taken. The fracture was in anatomic position and the hardware was well-positioned. The wound again  was copiously irrigated. The soft tissue was then carefully over the plate. The tissues were infiltrated with 1/2% marcaine.  The skin was closed with staples. Xeroform and a dry sterile  dressing were applied along with a volar splint. I was scrubbed and present for the entire case and all sharp and instrument counts were correct at the conclusion the case. The patient tolerated this procedure well and was awakened and taken to the recovery room in good condition.   Lisa Williams. Lisa Luster, MD

## 2017-01-18 ENCOUNTER — Encounter: Payer: Self-pay | Admitting: Orthopedic Surgery

## 2017-01-18 DIAGNOSIS — N390 Urinary tract infection, site not specified: Secondary | ICD-10-CM | POA: Diagnosis not present

## 2017-01-18 DIAGNOSIS — G609 Hereditary and idiopathic neuropathy, unspecified: Secondary | ICD-10-CM | POA: Diagnosis not present

## 2017-01-18 DIAGNOSIS — S52532D Colles' fracture of left radius, subsequent encounter for closed fracture with routine healing: Secondary | ICD-10-CM | POA: Diagnosis not present

## 2017-01-18 DIAGNOSIS — S52502D Unspecified fracture of the lower end of left radius, subsequent encounter for closed fracture with routine healing: Secondary | ICD-10-CM | POA: Diagnosis not present

## 2017-01-18 DIAGNOSIS — M8000XD Age-related osteoporosis with current pathological fracture, unspecified site, subsequent encounter for fracture with routine healing: Secondary | ICD-10-CM | POA: Diagnosis not present

## 2017-01-18 DIAGNOSIS — I10 Essential (primary) hypertension: Secondary | ICD-10-CM | POA: Diagnosis not present

## 2017-01-18 DIAGNOSIS — Z7401 Bed confinement status: Secondary | ICD-10-CM | POA: Diagnosis not present

## 2017-01-18 DIAGNOSIS — G35 Multiple sclerosis: Secondary | ICD-10-CM | POA: Diagnosis not present

## 2017-01-18 DIAGNOSIS — S62102A Fracture of unspecified carpal bone, left wrist, initial encounter for closed fracture: Secondary | ICD-10-CM | POA: Diagnosis not present

## 2017-01-18 DIAGNOSIS — S62102D Fracture of unspecified carpal bone, left wrist, subsequent encounter for fracture with routine healing: Secondary | ICD-10-CM | POA: Diagnosis not present

## 2017-01-18 DIAGNOSIS — Z87891 Personal history of nicotine dependence: Secondary | ICD-10-CM | POA: Diagnosis not present

## 2017-01-18 DIAGNOSIS — R2689 Other abnormalities of gait and mobility: Secondary | ICD-10-CM | POA: Diagnosis not present

## 2017-01-18 DIAGNOSIS — N319 Neuromuscular dysfunction of bladder, unspecified: Secondary | ICD-10-CM | POA: Diagnosis not present

## 2017-01-18 DIAGNOSIS — Z9181 History of falling: Secondary | ICD-10-CM | POA: Diagnosis not present

## 2017-01-18 DIAGNOSIS — S52532A Colles' fracture of left radius, initial encounter for closed fracture: Secondary | ICD-10-CM | POA: Diagnosis not present

## 2017-01-18 DIAGNOSIS — R29898 Other symptoms and signs involving the musculoskeletal system: Secondary | ICD-10-CM | POA: Diagnosis not present

## 2017-01-18 DIAGNOSIS — S52302A Unspecified fracture of shaft of left radius, initial encounter for closed fracture: Secondary | ICD-10-CM | POA: Diagnosis not present

## 2017-01-18 DIAGNOSIS — M6281 Muscle weakness (generalized): Secondary | ICD-10-CM | POA: Diagnosis not present

## 2017-01-18 LAB — HIV ANTIBODY (ROUTINE TESTING W REFLEX): HIV Screen 4th Generation wRfx: NONREACTIVE

## 2017-01-18 MED ORDER — DOCUSATE SODIUM 100 MG PO CAPS: 100.0000 mg | ORAL_CAPSULE | Freq: Two times a day (BID) | ORAL | 0 refills | Status: DC

## 2017-01-18 MED ORDER — POLYETHYLENE GLYCOL 3350 17 G PO PACK: 17.0000 g | PACK | Freq: Every day | ORAL | 0 refills | Status: DC | PRN

## 2017-01-18 MED ORDER — CEFUROXIME AXETIL 250 MG PO TABS: 250.0000 mg | ORAL_TABLET | Freq: Two times a day (BID) | ORAL | 0 refills | Status: AC

## 2017-01-18 MED ORDER — HYDROCODONE-ACETAMINOPHEN 5-325 MG PO TABS: 1.0000 | ORAL_TABLET | Freq: Four times a day (QID) | ORAL | 0 refills | Status: DC | PRN

## 2017-01-18 MED ORDER — DEXTROSE 5 % IV SOLN
1.0000 g | INTRAVENOUS | Status: DC
  Administered 2017-01-18: 1 g via INTRAVENOUS
  Filled 2017-01-18 (×2): qty 10

## 2017-01-18 MED ORDER — METOCLOPRAMIDE HCL 5 MG PO TABS: 10.0000 mg | ORAL_TABLET | Freq: Three times a day (TID) | ORAL | 0 refills | Status: DC | PRN

## 2017-01-18 NOTE — Progress Notes (Signed)
Pt alert and oriented. No complaints of wrist pain through the night. Voiding with external catheter without difficulty. Able to sleep in between care. Surgical dressing dry and intact. Pt able to move all fingers.

## 2017-01-18 NOTE — Clinical Social Work Placement (Signed)
   CLINICAL SOCIAL WORK PLACEMENT  NOTE  Date:  01/18/2017  Patient Details  Name: Lisa Williams MRN: [id number] Date of Birth: 02-May-1961  Clinical Social Work is seeking post-discharge placement for this patient at the Skilled  Nursing Facility level of care (*CSW will initial, date and re-position this form in  chart as items are completed):  Yes   Patient/family provided with [facility] Clinical Social Work Department's list of facilities offering this level of care within the geographic area requested by the patient (or if unable, by the patient's family).  Yes   Patient/family informed of their freedom to choose among providers that offer the needed level of care, that participate in Medicare, Medicaid or managed care program needed by the patient, have an available bed and are willing to accept the patient.  Yes   Patient/family informed of [facility]'s ownership interest in [facility] and [facility], as well as of the fact that they are under no obligation to receive care at these facilities.  PASRR submitted to EDS on       PASRR number received on       Existing PASRR number confirmed on 01/16/17     FL2 transmitted to all facilities in geographic area requested by pt/family on 01/16/17     FL2 transmitted to all facilities within larger geographic area on       Patient informed that his/her managed care company has contracts with or will negotiate with certain facilities, including the following:        Yes   Patient/family informed of bed offers received.  Patient chooses bed at  [facility] )     Physician recommends and patient chooses bed at      Patient to be transferred to  [facility] ) on 01/18/17.  Patient to be transferred to facility by  [facility] )     Patient family notified on 01/18/17 of transfer.  Name of family member notified:   (Patient's mother Claris Che is aware of D/C today. )     PHYSICIAN        Additional Comment:    _______________________________________________ [employer], Darleen Crocker, LCSW 01/18/2017, 4:22 PM

## 2017-01-18 NOTE — Progress Notes (Signed)
Patient is medically stable for D/C to [facility] today. [facility] authorization has been received, auth # [id number]. Per [facility] case manager SNF will not be approved for the whole 12 weeks that patient will be non-weight bearing. Per [facility] admissions coordinator at [facility] patient can come today to room 201-B. RN will call report at [phone] and arrange EMS for transport. Clinical Education officer, museum (CSW) sent D/C orders to [facility] today via [employer]. CSW met with patient and made her aware of above. Patient reported that she use to work for [employer] and understands that she will only be approved for a few weeks. CSW contacted patient's mother [name] and made her aware of above. Please reconsult if future social work needs arise. CSW signing off.   [employer], LCSW [phone]

## 2017-01-18 NOTE — Evaluation (Signed)
Occupational Therapy Evaluation Patient Details Name: Lisa Williams MRN: [id number] DOB: [birth year] Today's Date: 01/18/2017    History of Present Illness  Lisa Williams  is a 56 y.o. female with a known history of MS- walks with a walker, today morning, after her shower came out of bathroom ( Was not wet or no wet floor) felt dizzy ( no loss of consciousness or chest pain or palpitation) fell, and broke her left wrist. She walks with a walker and so not able to walk now.  ORIF of L wrist 01-17-17 with soft brace in place and is non WB through LUE.   Clinical Impression   Pt. Is an 56 y.o. female with known history of MS who fell and sustained a L wrist fracture and underwent an ORFI of L wrist 01-17-17 and is non-WB.  She has bilateral AFOs which she normally can don and doff independently as well as complete dressing, bathing and grooming skills but is now non WB through LUE and requires mod to max assist for LB dressing skills.  Discussed trying AD for LB dressing at next venue with restrictions of no pushing, pulling or squeezing with L hand.  Pt. presents with decreased strength, decreased functional hand use and decreased functional mobility which hinder her ability to complete ADL and IADL tasks. Patient was standing with PT prior to this evaluation and had difficulty advancing feet with AFOs and shoes on using platform walker and min assist to stand of 2 people and set up. She has long standing fatigue in LEs from MS and now with pain in LUE after standing briefly.   Patient could benefit from skilled OT services to work on reinforcing precautions for LUE, edema mgt,  positioning, and pt./family education.  Patient education was provided about edema mgt, precautions and rec adaptations for ADLs, LUE ROM and positioning.  Patient would be a good candidate to continue OT services at SNF.    Follow Up Recommendations  SNF    Equipment Recommendations       Recommendations for  Other Services       Precautions / Restrictions Precautions Precautions: Fall;Other (comment) Precaution Comments: h/o MS with diffuse BLE weakness Restrictions Weight Bearing Restrictions: Yes LUE Weight Bearing: Non weight bearing      Mobility Bed Mobility Overal bed mobility: Needs Assistance Bed Mobility: Supine to Sit     Supine to sit: HOB elevated;Mod assist     General bed mobility comments: Increased time and effort.  Heavy use of bed rail and requires assist to elevate trunk and to advance LEs to EOB.  Use of bed pad to assist with scooting to EOB.  Transfers Overall transfer level: Needs assistance Equipment used: Left platform walker Transfers: Sit to/from Stand Sit to Stand: From elevated surface;Mod assist;+2 physical assistance;+2 safety/equipment         General transfer comment: Assist for proper positioning of BLEs and and cues for proper technique using platform L RW.  Unable to stand with just +1 assist and requires +2 assist to boost to standing.  Assist for controlled descent to sitting.   Performed sit<>stand x2.    Balance Overall balance assessment: Needs assistance;History of Falls Sitting-balance support: Feet supported;No upper extremity supported Sitting balance-Leahy Scale: Fair Sitting balance - Comments: Pt able to sit statically EOB without UE support but would likely lose her balance with perturbation   Standing balance support: Bilateral upper extremity supported;During functional activity Standing balance-Leahy Scale: Poor Standing balance comment: Pt relies  on BUE support and outside physical assist to maintain balance standing EOB                           ADL either performed or assessed with clinical judgement   ADL Overall ADL's : Needs assistance/impaired Eating/Feeding: Minimal assistance;Set up   Grooming: Wash/dry hands;Wash/dry face;Oral care;Applying deodorant;Brushing hair;Set up;Modified independent            Upper Body Dressing : Set up;Minimal assistance   Lower Body Dressing: Set up;Maximal assistance Lower Body Dressing Details (indicate cue type and reason): decreased functional use of LUE and hand due to pain and rec no pulling or squeezing with fingers for donning and doffing socks, AFO or shoes due to non WB status after ORFI of L wrist.  Pt may benefit from use of AD using R UE for LB dressing.                 General ADL Comments: Pt is motivated and eager to regain independence.  She lives at home with her mother and sister.       Vision Patient Visual Report: No change from baseline       Perception     Praxis      Pertinent Vitals/Pain Pain Assessment: 0-10 Pain Score: 8  Pain Location: L wrist and forearm Pain Descriptors / Indicators: Aching;Discomfort;Grimacing Pain Intervention(s): Limited activity within patient's tolerance;Monitored during session;Repositioned;Premedicated before session     Hand Dominance Right   Extremity/Trunk Assessment Upper Extremity Assessment Upper Extremity Assessment: LUE deficits/detail LUE Deficits / Details: LUE in soft cast and ace wrap and is non WB through LUE. Mild swelling in fingers but full movement and able to wiggle fingers. Intact sensation.  Rec elevating LUE on pillows as much as possible to decrease edema.   LUE Coordination: decreased fine motor;decreased gross motor   Lower Extremity Assessment Lower Extremity Assessment: RLE deficits/detail;LLE deficits/detail RLE Deficits / Details: DF 0/5, knee extension 2/5, knee flexion 3+/5, hip flexion 1/5 RLE Sensation:  (WNL) LLE Deficits / Details: DF 1/5, knee extension 2/5, knee flexion 3/5, hip flexion 1/5 LLE Sensation:  (WNL)       Communication Communication Communication: No difficulties   Cognition Arousal/Alertness: Awake/alert Behavior During Therapy: WFL for tasks assessed/performed Overall Cognitive Status: Within Functional Limits for tasks  assessed                                     General Comments       Exercises Exercises: Other exercises;General Upper Extremity General Exercises - Upper Extremity Elbow Flexion: AROM;Left;10 reps;Supine Elbow Extension: AROM;Left;10 reps;Supine Other Exercises Other Exercises: Lateral weight shifts in standing with use of mirror feedback and verbal cues.   Other Exercises: Marching in place with cues for proper weight shift.  Pt unable to achieve foot clearance due to weakness Bil hip flexors.    Shoulder Instructions      Home Living Family/patient expects to be discharged to:: Private residence Living Arrangements: Parent;Other relatives Available Help at Discharge: Family;Available 24 hours/day Type of Home: House Home Access: Stairs to enter [employer] of Steps: 1   Home Layout: Two level;Able to live on main level with bedroom/bathroom     Bathroom Shower/Tub: Tub/shower unit;Curtain   Bathroom Toilet: Handicapped height Bathroom Accessibility: Yes How Accessible: Accessible via walker Home Equipment: Walker - 2 wheels;Wheelchair -  manual;Grab bars - toilet;Hand held shower head;Tub bench          Prior Functioning/Environment Level of Independence: Independent with assistive device(s)        Comments: Pt independent with driving, bathing, dressing.  Sits on tub bench and uses hand held shower head to shower.  Mother does the cooking and majority of cleaning but pt able to do her own laundry.          OT Problem List: Decreased strength;Decreased range of motion;Decreased activity tolerance;Pain;Impaired balance (sitting and/or standing);Increased edema;Decreased coordination;Impaired UE functional use      OT Treatment/Interventions: Self-care/ADL training;DME and/or AE instruction;Therapeutic activities;Patient/family education    OT Goals(Current goals can be found in the care plan section) Acute Rehab OT Goals Patient Stated  Goal: I want to be independent again, I don't like relying on others to help me OT Goal Formulation: With patient Time For Goal Achievement: 01/25/17 Potential to Achieve Goals: Good ADL Goals Pt Will Perform Lower Body Dressing: with set-up;with min assist;with adaptive equipment;sit to/from stand (with no LOB standing and assist for B AFOs) Pt Will Transfer to Toilet: with set-up;with min assist;bedside commode;regular height toilet (BSC over toilet to simulate home set up with rails)  OT Frequency: Min 1X/week   Barriers to D/C:            Co-evaluation              AM-PAC PT "6 Clicks" Daily Activity     Outcome Measure Help from another person eating meals?: A Little Help from another person taking care of personal grooming?: A Little Help from another person toileting, which includes using toliet, bedpan, or urinal?: A Lot Help from another person bathing (including washing, rinsing, drying)?: A Lot Help from another person to put on and taking off regular upper body clothing?: A Little Help from another person to put on and taking off regular lower body clothing?: A Lot 6 Click Score: 15   End of Session    Activity Tolerance: Patient tolerated treatment well Patient left: with nursing/sitter in room;in bed;Other (comment) (sitting EOB with NSG and NSg student in room starting antibiotics)  OT Visit Diagnosis: Muscle weakness (generalized) (M62.81);Pain;Unsteadiness on feet (R26.81);History of falling (Z91.81) Pain - Right/Left: Left Pain - part of body: Hand;Arm                Time: 1110-1135 OT Time Calculation (min): 25 min Charges:  OT General Charges $OT Visit: 1 Visit OT Evaluation $OT Eval Moderate Complexity: 1 Mod OT Treatments $Self Care/Home Management : 8-22 mins G-Codes: OT G-codes **NOT FOR INPATIENT CLASS** Functional Assessment Tool Used: AM-PAC 6 Clicks Daily Activity;Clinical judgement Functional Limitation: Self care Self Care Current  Status (Y6378): At least 40 percent but less than 60 percent impaired, limited or restricted Self Care Goal Status (H8850): At least 20 percent but less than 40 percent impaired, limited or restricted   Susanne Borders, OTR/L ascom [phone] 01/18/17, 11:52 AM

## 2017-01-18 NOTE — Progress Notes (Signed)
Pt discharged to [facility] per MD order without incident via non-emergency transfer. No change in pt from AM assessment. Pt pain controlled on discharge. Prior to transport, rept called to [employer] at [place]. Discharge packet with prescriptions sent with patient via EMT's.

## 2017-01-18 NOTE — Discharge Summary (Signed)
[facility] - [place] [facility]   PATIENT NAME: Lisa Williams    MR#:  [id number]  DATE OF BIRTH:  09-17-1960  DATE OF ADMISSION:  01/16/2017 ADMITTING PHYSICIAN: Altamese Dilling, MD  DATE OF DISCHARGE: 01/18/2017  PRIMARY CARE PHYSICIAN: Lauro Regulus, MD    ADMISSION DIAGNOSIS:  Closed Colles' fracture of left radius, initial encounter [S52.532A]  DISCHARGE DIAGNOSIS:  Principal Problem:   Wrist fracture  UTi  SECONDARY DIAGNOSIS:   Past Medical History:  Diagnosis Date  . Back pain   . Calculus of kidney 12/09/2013  . Chronic left shoulder pain 07/19/2015  . Galactorrhea 11/26/2014   Chronic   . Hereditary and idiopathic neuropathy 08/19/2013  . HPV test positive   . Hypercholesteremia 08/19/2013  . MS (multiple sclerosis) (HCC)   . Muscle spasticity 05/21/2014  . Status post laparoscopic supracervical hysterectomy 11/26/2014  . Tobacco user 11/26/2014  . Wrist fracture     HOSPITAL COURSE:  * Left wrist fracture- s/p surgery. Ortho consult Pain management,. As she walks with a walker, need PT eval after surgery.   Suggest rehab.  * MS  In control.  On rituxan every 6 mths, next dose in oct. Cont with neuro clinic. Cont muscle relaxers as need.  * Hypertension  Cont losartan  * UTI   Rocephin IV in hospital, give oral cefuroxime for 4 days after d/c.  DISCHARGE CONDITIONS:   Stable.  CONSULTS OBTAINED:  Treatment Team:  Lyndle Herrlich, MD  DRUG ALLERGIES:   Allergies  Allergen Reactions  . Amlodipine Other (See Comments)    edema  . Povidone Iodine Rash    DISCHARGE MEDICATIONS:   Current Discharge Medication List    START taking these medications   Details  cefUROXime (CEFTIN) 250 MG tablet Take 1 tablet (250 mg total) by mouth 2 (two) times daily. Qty: 8 tablet, Refills: 0    docusate sodium (COLACE) 100 MG capsule Take 1 capsule (100 mg total) by mouth 2 (two) times  daily. Qty: 10 capsule, Refills: 0    HYDROcodone-acetaminophen (NORCO) 5-325 MG tablet Take 1 tablet by mouth every 6 (six) hours as needed for moderate pain. Qty: 20 tablet, Refills: 0    metoCLOPramide (REGLAN) 5 MG tablet Take 2 tablets (10 mg total) by mouth every 8 (eight) hours as needed for nausea (if ondansetron (ZOFRAN) ineffective.). Qty: 20 tablet, Refills: 0    polyethylene glycol (MIRALAX / GLYCOLAX) packet Take 17 g by mouth daily as needed for mild constipation. Qty: 14 each, Refills: 0      CONTINUE these medications which have NOT CHANGED   Details  baclofen (LIORESAL) 20 MG tablet Take 20 mg by mouth 3 (three) times daily.     Calcium Carb-Cholecalciferol (CALCIUM 600+D) 600-800 MG-UNIT TABS Take 1 tablet by mouth daily.    losartan (COZAAR) 100 MG tablet Take 100 mg by mouth daily.     Multiple Vitamin (MULTIVITAMIN) tablet Take 1 tablet by mouth daily.    oxybutynin (DITROPAN) 5 MG tablet Take 1 tablet (5 mg total) by mouth 2 (two) times daily.    RITUXIMAB IV     meloxicam (MOBIC) 15 MG tablet Take 1 tablet (15 mg total) by mouth 2 (two) times daily as needed for pain. Refills: 2    tiZANidine (ZANAFLEX) 4 MG tablet Take 4 mg by mouth every 6 (six) hours as needed.         DISCHARGE INSTRUCTIONS:    Follow with ortho  clinic in 2 weeks.  If you experience worsening of your admission symptoms, develop shortness of breath, life threatening emergency, suicidal or homicidal thoughts you must seek medical attention immediately by calling 911 or calling your MD immediately  if symptoms less severe.  You Must read complete instructions/literature along with all the possible adverse reactions/side effects for all the Medicines you take and that have been prescribed to you. Take any new Medicines after you have completely understood and accept all the possible adverse reactions/side effects.   Please note  You were cared for by a hospitalist during your  hospital stay. If you have any questions about your discharge medications or the care you received while you were in the hospital after you are discharged, you can call the unit and asked to speak with the hospitalist on call if the hospitalist that took care of you is not available. Once you are discharged, your primary care physician will handle any further medical issues. Please note that NO REFILLS for any discharge medications will be authorized once you are discharged, as it is imperative that you return to your primary care physician (or establish a relationship with a primary care physician if you do not have one) for your aftercare needs so that they can reassess your need for medications and monitor your lab values.    Today   CHIEF COMPLAINT:   Chief Complaint  Patient presents with  . Arm Pain    HISTORY OF PRESENT ILLNESS:  Lisa Williams  is a 56 y.o. female with a known history of MS- walks with a walker, today morning, after her shower came out of bathroom ( Was not wet or no wet floor) felt dizzi ( no loss of consciousness or chest pain or palpitation) fell, and broke her left wrist. She walks with a walker and so not able to walk now, so suggested to admit. Ortho plans for surgery tomorrow.   VITAL SIGNS:  Blood pressure (!) 152/71, pulse 61, temperature 98.3 F (36.8 C), temperature source Oral, resp. rate 18, height  (1.803 m), weight 106.1 kg (234 lb), SpO2 100 %.  I/O:   Intake/Output Summary (Last 24 hours) at 01/18/17 1228 Last data filed at 01/18/17 1000  Gross per 24 hour  Intake             1125 ml  Output             1150 ml  Net              -25 ml    PHYSICAL EXAMINATION:   GENERAL:  56 y.o.-year-old patient lying in the bed with no acute distress.  EYES: Pupils equal, round, reactive to light and accommodation. No scleral icterus. Extraocular muscles intact.  HEENT: Head atraumatic, normocephalic. Oropharynx and nasopharynx clear.  NECK:   Supple, no jugular venous distention. No thyroid enlargement, no tenderness.  LUNGS: Normal breath sounds bilaterally, no wheezing, rales,rhonchi or crepitation. No use of accessory muscles of respiration.  CARDIOVASCULAR: S1, S2 normal. No murmurs, rubs, or gallops.  ABDOMEN: Soft, nontender, nondistended. Bowel sounds present. No organomegaly or mass.  EXTREMITIES: No pedal edema, cyanosis, or clubbing. Left wrist dressing and cast present. NEUROLOGIC: Cranial nerves II through XII are intact. Muscle strength 5/5 in upper extremities 4/5 in lower extremities. Sensation intact. Gait not checked.  PSYCHIATRIC: The patient is alert and oriented x 3.  SKIN: No obvious rash, lesion, or ulcer.   DATA REVIEW:   CBC  Recent  Labs Lab 01/17/17 0452  WBC 8.1  HGB 12.6  HCT 36.3  PLT 195    Chemistries   Recent Labs Lab 01/16/17 0924 01/17/17 0452  NA 142 142  K 3.8 3.9  CL 112* 110  CO2 25 27  GLUCOSE 95 99  BUN 26* 23*  CREATININE 0.61 0.62  CALCIUM 8.9 8.8*  AST 18  --   ALT 14  --   ALKPHOS 99  --   BILITOT 0.6  --     Cardiac Enzymes No results for input(s): TROPONINI in the last 168 hours.  Microbiology Results  Results for orders placed or performed during the hospital encounter of 01/16/17  Surgical PCR screen     Status: None   Collection Time: 01/16/17  3:00 PM  Result Value Ref Range Status   MRSA, PCR NEGATIVE NEGATIVE Final   Staphylococcus aureus NEGATIVE NEGATIVE Final    Comment: (NOTE) The Xpert SA Assay (FDA approved for NASAL specimens in patients 55 years of age and older), is one component of a comprehensive surveillance program. It is not intended to diagnose infection nor to guide or monitor treatment.     RADIOLOGY:  No results found.  EKG:   Orders placed or performed during the hospital encounter of 01/16/17  . ED EKG  . ED EKG      Management plans discussed with the patient, family and they are in agreement.  CODE STATUS:      Code Status Orders        Start     Ordered   01/16/17 1244  Full code  Continuous     01/16/17 1243    Code Status History    Date Active Date Inactive Code Status Order ID Comments User Context   03/24/2016 12:13 PM 03/27/2016  9:20 PM Full Code [id number]  Adrian Saran, MD Inpatient   10/02/2015  7:59 PM 10/05/2015  5:30 PM Full Code [id number]  Adrian Saran, MD Inpatient   07/14/2015  4:05 PM 07/17/2015  9:20 PM Full Code [id number]  Enid Baas, MD Inpatient    Advance Directive Documentation     Most Recent Value  Type of Advance Directive  Living will  Pre-existing out of facility DNR order (yellow form or pink MOST form)  -  "MOST" Form in Place?  -      TOTAL TIME TAKING CARE OF THIS PATIENT: 35 minutes.    Altamese Dilling M.D on 01/18/2017 at 12:28 PM  Between 7am to 6pm - Pager - [phone]  After 6pm go to [email or web address] - Social research officer, government  [facility]  Office  [phone]  CC: Primary care physician; Lauro Regulus, MD   Note: This dictation was prepared with Dragon dictation along with smaller phrase technology. Any transcriptional errors that result from this process are unintentional.

## 2017-01-18 NOTE — Progress Notes (Signed)
PT Evaluation Note  Assessment: Patient is s/p above surgery resulting in functional limitations due to the deficits listed below (see PT Problem List). Ms. Mathson is extremely motivated to return to [facility] and is an excellent candidate for rehab.  She was Ind with all ADLs PTA and was ambulating household distances with RW without assist.  She currently requires mod +2 assist for sit<>stand transfers and assist for bed mobility.  She performed sit<>stand x2 and performed lateral weight shifting and marching at bedside.  She is a strong rehab candidate and an excellent candidate for SNF at d/c to assist with return to PLOF.  Patient will benefit from skilled PT to increase their independence and safety with mobility to allow discharge to the venue listed below.      01/18/17 1115  PT Visit Information  Last PT Received On 01/18/17  Assistance Needed +2  History of Present Illness Pt is a 56 y/o F who presented s/p fall with resultant L wrist fx and is now s/p ORIF L wrist.  Pt's PMH includes MS, tobacco user, muscle spasticity, chronic L shoulder pain, back pain, R knee surgery.   Precautions  Precautions Fall;Other (comment)  Precaution Comments h/o MS with diffuse BLE weakness  Restrictions  Weight Bearing Restrictions Yes  LUE Weight Bearing NWB  Home Living  Family/patient expects to be discharged to: Private residence  Living Arrangements Parent;Other relatives (sister and parents)  Available Help at Discharge Family;Available 24 hours/day  Type of Home House  Home Access Stairs to enter  Entrance Stairs-Number of Steps 1  Bathroom Shower/Tub Tub/shower unit  Home Equipment [place] - 2 wheels;Wheelchair - manual;Grab bars - toilet;Hand held shower head;Tub bench  Prior Function  Level of Independence Independent with assistive device(s)  Comments Pt independent with driving, bathing, dressing.  Sits on tub bench and uses hand held shower head to shower.  Mother does the cooking and  majority of cleaning but pt able to do her own laundry.  Pt ambulates with RW household distances, saying she drags her feet as she is unable to pick them up.  She self propels WC using BUEs when in the community.  Previous falls was ~1 year ago.   Communication  Communication No difficulties  Pain Assessment  Pain Assessment 0-10  Pain Score 8  Pain Location L wrist and forearm  Pain Descriptors / Indicators Aching;Discomfort;Grimacing  Pain Intervention(s) Limited activity within patient's tolerance;Monitored during session  Cognition  Arousal/Alertness Awake/alert  Behavior During Therapy WFL for tasks assessed/performed  Overall Cognitive Status Within Functional Limits for tasks assessed  Upper Extremity Assessment  Upper Extremity Assessment Defer to OT evaluation (RUE appears WFL for functional tasks asssessed)  Lower Extremity Assessment  Lower Extremity Assessment RLE deficits/detail;LLE deficits/detail  RLE Deficits / Details DF 0/5, knee extension 2/5, knee flexion 3+/5, hip flexion 1/5  RLE Sensation (WNL)  LLE Deficits / Details DF 1/5, knee extension 2/5, knee flexion 3/5, hip flexion 1/5  LLE Sensation (WNL)  Bed Mobility  Overal bed mobility Needs Assistance  Bed Mobility Supine to Sit  Supine to sit HOB elevated;Mod assist  General bed mobility comments Increased time and effort.  Heavy use of bed rail and requires assist to elevate trunk and to advance LEs to EOB.  Use of bed pad to assist with scooting to EOB.  Transfers  Overall transfer level Needs assistance  Equipment used Left platform walker  Transfers Sit to/from Stand  Sit to Stand From elevated surface;Mod assist;+2 physical  assistance;+2 safety/equipment  General transfer comment Assist for proper positioning of BLEs and and cues for proper technique using platform L RW.  Unable to stand with just +1 assist and requires +2 assist to boost to standing.  Assist for controlled descent to sitting.   Performed  sit<>stand x2.  Ambulation/Gait  General Gait Details Unable to assess at this time.  Performed pre-gait activites as documented below.   Balance  Overall balance assessment Needs assistance;History of Falls  Sitting-balance support Feet supported;No upper extremity supported  Sitting balance-Leahy Scale Fair  Sitting balance - Comments Pt able to sit statically EOB without UE support but would likely lose her balance with perturbation  Standing balance support Bilateral upper extremity supported;During functional activity  Standing balance-Leahy Scale Poor  Standing balance comment Pt relies on BUE support and outside physical assist to maintain balance standing EOB  Exercises  Exercises Other exercises;General Upper Extremity  General Exercises - Upper Extremity  Elbow Flexion AROM;Left;10 reps;Supine  Elbow Extension AROM;Left;10 reps;Supine  Other Exercises  Other Exercises Lateral weight shifts in standing with use of mirror feedback and verbal cues.    Other Exercises Marching in place with cues for proper weight shift.  Pt unable to achieve foot clearance due to weakness Bil hip flexors.   PT - End of Session  Equipment Utilized During Treatment Gait belt  Activity Tolerance Patient tolerated treatment well;Patient limited by fatigue  Patient left in bed;with call bell/phone within reach;Other (comment) (OT at bedside and pt sitting EOB.)  Nurse Communication Mobility status  PT Assessment  PT Recommendation/Assessment Patient needs continued PT services  PT Visit Diagnosis Pain;Difficulty in walking, not elsewhere classified (R26.2);Muscle weakness (generalized) (M62.81);Unsteadiness on feet (R26.81);Other abnormalities of gait and mobility (R26.89);History of falling (Z91.81)  Pain - Right/Left Left  Pain - part of body Arm (forearm and wrist)  PT Problem List Decreased strength;Decreased range of motion;Decreased activity tolerance;Decreased balance;Decreased  mobility;Decreased knowledge of use of DME;Decreased safety awareness;Pain  Barriers to Discharge Inaccessible home environment;Other (comment)  Barriers to Discharge Comments 1 step to enter home and family unable to provide the level of assist the pt currently requires.  PT Plan  PT Frequency (ACUTE ONLY) BID  PT Treatment/Interventions (ACUTE ONLY) DME instruction;Gait training;Stair training;Functional mobility training;Therapeutic activities;Therapeutic exercise;Balance training;Neuromuscular re-education;Patient/family education;Wheelchair mobility training;Modalities  AM-PAC PT "6 Clicks" Daily Activity Outcome Measure  Difficulty turning over in bed (including adjusting bedclothes, sheets and blankets)? 1  Difficulty moving from lying on back to sitting on the side of the bed?  1  Difficulty sitting down on and standing up from a chair with arms (e.g., wheelchair, bedside commode, etc,.)? 1  Help needed moving to and from a bed to chair (including a wheelchair)? 2  Help needed walking in hospital room? 2  Help needed climbing 3-5 steps with a railing?  1  6 Click Score 8  Mobility G Code  CM  PT Recommendation  Follow Up Recommendations SNF  PT equipment Other (comment);Rolling walker with 5" wheels (L Platform for RW in addition to RW)  Individuals Consulted  Consulted and Agree with Results and Recommendations Patient  Acute Rehab PT Goals  Patient Stated Goal to return to PLOF  PT Goal Formulation With patient  Time For Goal Achievement 02/01/17  Potential to Achieve Goals Good  PT Time Calculation  PT Start Time (ACUTE ONLY) 1021  PT Stop Time (ACUTE ONLY) 1109  PT Time Calculation (min) (ACUTE ONLY) 48 min  PT G-Codes **NOT FOR INPATIENT  CLASS**  Functional Assessment Tool Used AM-PAC 6 Clicks Basic Mobility;Clinical judgement  Functional Limitation Mobility: Walking and moving around  Mobility: Walking and Moving Around Current Status (A5409) CM  Mobility: Walking and  Moving Around Goal Status (W1191) CK  PT General Charges  $$ ACUTE PT VISIT 1 Visit  PT Evaluation  $PT Eval Moderate Complexity 1 Mod  PT Treatments  $Therapeutic Exercise 8-22 mins  $Therapeutic Activity 8-22 mins  Written Expression  Dominant Hand Right   Encarnacion Chu PT, DPT

## 2017-01-19 DIAGNOSIS — M8080XD Other osteoporosis with current pathological fracture, unspecified site, subsequent encounter for fracture with routine healing: Secondary | ICD-10-CM | POA: Insufficient documentation

## 2017-01-19 DIAGNOSIS — M8000XD Age-related osteoporosis with current pathological fracture, unspecified site, subsequent encounter for fracture with routine healing: Secondary | ICD-10-CM | POA: Diagnosis not present

## 2017-01-19 DIAGNOSIS — G35 Multiple sclerosis: Secondary | ICD-10-CM | POA: Diagnosis not present

## 2017-01-22 ENCOUNTER — Other Ambulatory Visit: Payer: Self-pay

## 2017-01-22 MED ORDER — HYDROCODONE-ACETAMINOPHEN 5-325 MG PO TABS: 1.0000 | ORAL_TABLET | Freq: Four times a day (QID) | ORAL | 0 refills | Status: DC | PRN

## 2017-01-22 NOTE — Telephone Encounter (Signed)
Rx sent to [facility] phone : [phone] , fax : [phone]  

## 2017-01-23 ENCOUNTER — Non-Acute Institutional Stay (SKILLED_NURSING_FACILITY): Payer: PPO | Admitting: Gerontology

## 2017-01-23 ENCOUNTER — Encounter: Payer: Self-pay | Admitting: Gerontology

## 2017-01-23 DIAGNOSIS — G35 Multiple sclerosis: Secondary | ICD-10-CM | POA: Diagnosis not present

## 2017-01-23 DIAGNOSIS — S62102D Fracture of unspecified carpal bone, left wrist, subsequent encounter for fracture with routine healing: Secondary | ICD-10-CM | POA: Diagnosis not present

## 2017-01-23 NOTE — Progress Notes (Signed)
Location:   [facility] Room Number: 201B Place of Service:  SNF [phone]) Provider:  Lorenso Quarry, NP-C  Lauro Regulus, MD  Patient Care Team: Lauro Regulus, MD as PCP - General (Internal Medicine) Morene Crocker, MD as Referring Physician (Neurology)  Extended Emergency Contact Information Primary Emergency Contact: Servando Salina Address: [address]          [place], [place] [address] [place] Home Phone: [phone] Relation: Aunt Secondary Emergency Contact: Heeren,Margaret  [place] Home Phone: [phone] Relation: Mother  Code Status:  Full Goals of care: Advanced Directive information Advanced Directives 01/23/2017  Does Patient Have a Medical Advance Directive? No  Type of Advance Directive -  Does patient want to make changes to medical advance directive? -  Copy of Healthcare Power of Attorney in Chart? -     Chief Complaint  Patient presents with  . Hospitalization Follow-up    Follow up on left wrist fracture    HPI:  Pt is a 56 y.o. female seen today for a hospital f/u s/p admission from [facility] for fall with left wrist fracture. Pt then underwent an ORIF of the left wrist. Pt has hard cast in place. She is participating in PT/OT for mobility. Pt also has dx of MS of which she is ambulatory with a walker. Pt having therapy training with walker with a platform. Pt is in good spirits. Reports pain is well controlled on current regimen. Reports appetite is good. Having regular BMs. VSS. No other complaints.    Past Medical History:  Diagnosis Date  . Abdominal pain, right upper quadrant   . Back pain   . Calculus of kidney 12/09/2013  . Chronic back pain    unspecified  . Chronic left shoulder pain 07/19/2015  . Functional disorder of bladder    other  . Galactorrhea 11/26/2014   Chronic   . Hereditary and idiopathic neuropathy 08/19/2013  . HPV test positive   . Hypercholesteremia  08/19/2013  . Incomplete bladder emptying   . Microscopic hematuria   . MS (multiple sclerosis) (HCC)   . Muscle spasticity 05/21/2014  . Nonspecific findings on examination of urine    other  . Osteopenia   . Status post laparoscopic supracervical hysterectomy 11/26/2014  . Tobacco user 11/26/2014  . Wrist fracture    Past Surgical History:  Procedure Laterality Date  . bilateral tubal ligation  1996  . BREAST CYST EXCISION Left 2002  . KNEE SURGERY     right  . LAPAROSCOPIC SUPRACERVICAL HYSTERECTOMY  08/05/2013  . ORIF WRIST FRACTURE Left 01/17/2017   Procedure: OPEN REDUCTION INTERNAL FIXATION (ORIF) WRIST FRACTURE;  Surgeon: Lyndle Herrlich, MD;  Location: [facility] ORS;  Service: Orthopedics;  Laterality: Left;  . TUBAL LIGATION Bilateral   . VAGINAL HYSTERECTOMY  03/2006    Allergies  Allergen Reactions  . Amlodipine Other (See Comments)    edema  . Povidone Iodine Rash    Allergies as of 01/23/2017      Reactions   Amlodipine Other (See Comments)   edema   Povidone Iodine Rash      Medication List       Accurate as of 01/23/17 11:43 AM. Always use your most recent med list.          acetaminophen 325 MG tablet Commonly known as:  TYLENOL Take 650 mg by mouth every 8 (eight) hours as needed.   baclofen 20  MG tablet Commonly known as:  LIORESAL Take 20 mg by mouth 3 (three) times daily.   CALCIUM 600+D 600-800 MG-UNIT Tabs Generic drug:  Calcium Carb-Cholecalciferol Take 1 tablet by mouth daily.   docusate sodium 100 MG capsule Commonly known as:  COLACE Take 1 capsule (100 mg total) by mouth 2 (two) times daily.   HYDROcodone-acetaminophen 5-325 MG tablet Commonly known as:  NORCO Take 1 tablet by mouth every 6 (six) hours as needed for moderate pain.   losartan 100 MG tablet Commonly known as:  COZAAR Take 100 mg by mouth daily.   meloxicam 15 MG tablet Commonly known as:  MOBIC Take 1 tablet (15 mg total) by mouth 2 (two) times daily as needed for  pain.   metoCLOPramide 10 MG tablet Commonly known as:  REGLAN Take 10 mg by mouth every 8 (eight) hours as needed for nausea. If Zofran not effective   multivitamin tablet Take 1 tablet by mouth daily.   oxybutynin 5 MG tablet Commonly known as:  DITROPAN Take 1 tablet (5 mg total) by mouth 2 (two) times daily.   polyethylene glycol packet Commonly known as:  MIRALAX / GLYCOLAX Take 17 g by mouth daily as needed for mild constipation.   RITUXIMAB IV Pt uses every 6 months due October 2018   tiZANidine 4 MG tablet Commonly known as:  ZANAFLEX Take 4 mg by mouth every 6 (six) hours as needed for muscle spasms.       Review of Systems  Constitutional: Negative for activity change, appetite change, chills, diaphoresis and fever.  HENT: Negative for congestion, sneezing, sore throat, trouble swallowing and voice change.   Respiratory: Negative for apnea, cough, choking, chest tightness, shortness of breath and wheezing.   Cardiovascular: Negative for chest pain, palpitations and leg swelling.  Gastrointestinal: Negative for abdominal distention, abdominal pain, constipation, diarrhea and nausea.  Genitourinary: Negative for difficulty urinating, dysuria, frequency and urgency.  Musculoskeletal: Positive for arthralgias (typical arthritis). Negative for back pain, gait problem and myalgias.  Skin: Negative for color change, pallor, rash and wound.  Neurological: Positive for weakness. Negative for dizziness, tremors, syncope, speech difficulty, numbness and headaches.  Psychiatric/Behavioral: Negative for agitation and behavioral problems.  All other systems reviewed and are negative.   Immunization History  Administered Date(s) Administered  . PPD Test 07/14/2016, 07/18/2016  . Tdap 10/25/2015   Pertinent  Health Maintenance Due  Topic Date Due  . COLONOSCOPY  10/19/2011  . INFLUENZA VACCINE  12/06/2016  . PAP SMEAR  06/01/2018  . MAMMOGRAM  09/12/2018   No flowsheet  data found. Functional Status Survey:    Vitals:   01/23/17 1105  BP: 134/78  Pulse: 63  Resp: 18  Temp: 98.3 F (36.8 C)  SpO2: 96%  Weight: 250 lb 3.2 oz (113.5 kg)  Height:  (1.803 m)   Body mass index is 34.9 kg/m. Physical Exam  Constitutional: She is oriented to person, place, and time. Vital signs are normal. She appears well-developed and well-nourished. She is active and cooperative. She does not appear ill. No distress.  HENT:  Head: Normocephalic and atraumatic.  Mouth/Throat: Uvula is midline, oropharynx is clear and moist and mucous membranes are normal. Mucous membranes are not pale, not dry and not cyanotic.  Eyes: Pupils are equal, round, and reactive to light. Conjunctivae, EOM and lids are normal.  Neck: Trachea normal, normal range of motion and full passive range of motion without pain. Neck supple. No JVD present. No tracheal  deviation, no edema and no erythema present. No thyromegaly present.  Cardiovascular: Normal rate, regular rhythm, normal heart sounds, intact distal pulses and normal pulses.  Exam reveals no gallop, no distant heart sounds and no friction rub.   No murmur heard. Pulmonary/Chest: Effort normal and breath sounds normal. No accessory muscle usage. No respiratory distress. She has no wheezes. She has no rales. She exhibits no tenderness.  Abdominal: Normal appearance and bowel sounds are normal. She exhibits no distension and no ascites. There is no tenderness.  Musculoskeletal: She exhibits no edema.       Left wrist: She exhibits decreased range of motion, tenderness and laceration (under the cast).  Expected osteoarthritis, stiffness; fingers warm. Cap refills WNL. Fingers freely mobile. Compartments soft  Neurological: She is alert and oriented to person, place, and time. She has normal strength.  Skin: Skin is warm, dry and intact. No rash noted. She is not diaphoretic. No cyanosis or erythema. No pallor. Nails show no clubbing.    Psychiatric: She has a normal mood and affect. Her speech is normal and behavior is normal. Judgment and thought content normal. Cognition and memory are normal.  Nursing note and vitals reviewed.   Labs reviewed:  Recent Labs  03/27/16 0343 11/29/16 0000 01/16/17 0924 01/17/17 0452  NA 143  --  142 142  K 3.6  --  3.8 3.9  CL 112*  --  112* 110  CO2 28  --  25 27  GLUCOSE 130* 83 95 99  BUN 35*  --  26* 23*  CREATININE 0.67  --  0.61 0.62  CALCIUM 8.6*  --  8.9 8.8*    Recent Labs  04/21/16 1056 04/21/16 1102 07/20/16 1618 01/16/17 0924  AST 15  --  16 18  ALT 12  --  16 14  ALKPHOS 181* 178* 136* 99  BILITOT 0.5  --  0.4 0.6  PROT 7.3  --  7.3 7.0  ALBUMIN 4.3  --  4.4 3.8    Recent Labs  03/24/16 0903 01/16/17 0924 01/17/17 0452  WBC 11.9* 8.8 8.1  NEUTROABS 8.9*  --   --   HGB 12.7 12.7 12.6  HCT 36.8 36.9 36.3  MCV 78.5* 79.5* 79.5*  PLT 204 203 195   Lab Results  Component Value Date   TSH 2.520 11/29/2016   Lab Results  Component Value Date   HGBA1C 5.1 11/29/2016   Lab Results  Component Value Date   CHOL 187 11/29/2016   HDL 55 11/29/2016   LDLCALC 119 (H) 11/29/2016   TRIG 64 11/29/2016   CHOLHDL 3.4 11/29/2016    Significant Diagnostic Results in last 30 days:  Dg Wrist Complete Left  Result Date: 01/16/2017 CLINICAL DATA:  Left wrist pain after fall. EXAM: LEFT WRIST - COMPLETE 3+ VIEW COMPARISON:  None. FINDINGS: There is a comminuted, intra-articular fracture of the distal radius with dorsal angulation. Radiocarpal alignment and carpal alignment are preserved. Displaced fracture of the ulnar styloid. Bone mineralization is normal. Diffuse soft tissue swelling about the wrist. IMPRESSION: 1. Comminuted, intra-articular fracture of the distal radius with dorsal angulation. 2. Displaced ulnar styloid fracture. Electronically Signed   By: Obie Dredge M.D.   On: 01/16/2017 09:21   Ct Wrist Left Wo Contrast  Result Date:  01/16/2017 CLINICAL DATA:  Fractures of the distal left radius and ulna. EXAM: CT OF THE LEFT WRIST WITHOUT CONTRAST TECHNIQUE: Multidetector CT imaging was performed according to the standard protocol. Multiplanar CT image  reconstructions were also generated. COMPARISON:  Radiographs dated 01/16/2017 FINDINGS: Bones/Joint/Cartilage There is a severely comminuted fracture of the distal left radius with dorsal impaction and slight dorsal displacement of the distal fragments. There is a stellate component of the fracture involving the articular surface of the distal radius. There are several displaced and rotated cortical fragments. There is a tiny avulsion from the dorsal aspect of the distal ulna. IMPRESSION: 1. Comminuted impacted dorsally displaced fracture of the distal radius as described. 2. Tiny avulsion from the distal ulna. Electronically Signed   By: Francene Boyers M.D.   On: 01/16/2017 10:49   Dg Chest Port 1 View  Result Date: 01/16/2017 CLINICAL DATA:  Preop EXAM: PORTABLE CHEST 1 VIEW COMPARISON:  10/02/2015 FINDINGS: Heart is borderline in size. Lungs are clear. No effusions or edema. No acute bony abnormality. IMPRESSION: Borderline heart size.  No active disease. Electronically Signed   By: Charlett Nose M.D.   On: 01/16/2017 10:11    Assessment/Plan 1. Multiple sclerosis (HCC)  Continue working with PO/OT  Ambulatory with walker  2. Closed fracture of left wrist with routine healing, subsequent encounter  Continue working with PT/OT  Ambulate with walker with platform  Continue Hydrocodone/APAP 5/325 mg po Q 6 hours prn pain  Continue Zanaflex 4 mg po Q 6 hours prn for muscle spasms  Ice prn  Monitor skin integrity and tissue perfusion Q shift  Family/ staff Communication:   Total Time:  Documentation:  Face to Face:  Family/Phone:   Labs/tests ordered:    Brynda Rim, NP-C Geriatrics [facility] [address], [place] [address] Cell Phone (Mon-Fri 8am-5pm):  [phone] On Call:  [phone] & follow prompts after 5pm & weekends Office Phone:  [phone] Office Fax:  [phone]

## 2017-01-24 ENCOUNTER — Encounter: Payer: Self-pay | Admitting: Orthopedic Surgery

## 2017-01-30 ENCOUNTER — Non-Acute Institutional Stay (SKILLED_NURSING_FACILITY): Payer: PPO | Admitting: Gerontology

## 2017-01-30 ENCOUNTER — Encounter: Payer: Self-pay | Admitting: Gerontology

## 2017-01-30 DIAGNOSIS — G35 Multiple sclerosis: Secondary | ICD-10-CM

## 2017-01-30 DIAGNOSIS — S62102D Fracture of unspecified carpal bone, left wrist, subsequent encounter for fracture with routine healing: Secondary | ICD-10-CM

## 2017-01-30 NOTE — Progress Notes (Signed)
Location:   [facility] Room Number: 209A Place of Service:  SNF [phone]) Provider:  Lorenso Quarry, NP-C  Lauro Regulus, MD  Patient Care Team: Lauro Regulus, MD as PCP - General (Internal Medicine) Morene Crocker, MD as Referring Physician (Neurology)  Extended Emergency Contact Information Primary Emergency Contact: Louann [place] Home Phone: [phone] Work Phone: [phone] Relation: Sister Secondary Emergency Contact: Wilford [place] Home Phone: [phone] Work Phone: [phone] Relation: Sister Mother: Tatasha, [place] Home Phone: [phone]  Code Status:  FULL Goals of care: Advanced Directive information Advanced Directives 01/30/2017  Does Patient Have a Medical Advance Directive? No  Type of Advance Directive -  Does patient want to make changes to medical advance directive? -  Copy of Healthcare Power of Attorney in Chart? -     Chief Complaint  Patient presents with  . Medical Management of Chronic Issues    Routine Visit    HPI:  Pt is a 56 y.o. female seen today for a hospital f/u s/p admission from [facility] for fall with left wrist fracture. Pt then underwent an ORIF of the left wrist. Pt has hard cast in place. She is participating in PT/OT for mobility. Pt also has dx of MS of which she is ambulatory with a walker. Pt having therapy training with walker with a platform. Pt is in good spirits. Reports pain is well controlled on current regimen. Reports appetite is good. Having regular BMs. VSS. No other complaints.        Past Medical History:  Diagnosis Date  . Abdominal pain, right upper quadrant   . Back pain   . Calculus of kidney 12/09/2013  . Chronic back pain    unspecified  . Chronic left shoulder pain 07/19/2015  . Functional disorder of bladder    other  . Galactorrhea 11/26/2014   Chronic   . Hereditary and  idiopathic neuropathy 08/19/2013  . HPV test positive   . Hypercholesteremia 08/19/2013  . Incomplete bladder emptying   . Microscopic hematuria   . MS (multiple sclerosis) (HCC)   . Muscle spasticity 05/21/2014  . Nonspecific findings on examination of urine    other  . Osteopenia   . Status post laparoscopic supracervical hysterectomy 11/26/2014  . Tobacco user 11/26/2014  . Wrist fracture    Past Surgical History:  Procedure Laterality Date  . bilateral tubal ligation  1996  . BREAST CYST EXCISION Left 2002  . KNEE SURGERY     right  . LAPAROSCOPIC SUPRACERVICAL HYSTERECTOMY  08/05/2013  . ORIF WRIST FRACTURE Left 01/17/2017   Procedure: OPEN REDUCTION INTERNAL FIXATION (ORIF) WRIST FRACTURE;  Surgeon: Lyndle Herrlich, MD;  Location: [facility] ORS;  Service: Orthopedics;  Laterality: Left;  . TUBAL LIGATION Bilateral   . VAGINAL HYSTERECTOMY  03/2006    Allergies  Allergen Reactions  . Amlodipine Other (See Comments)    edema  . Povidone Iodine Rash    Allergies as of 01/30/2017      Reactions   Amlodipine Other (See Comments)   edema   Povidone Iodine Rash      Medication List       Accurate as of 01/30/17 11:39 AM. Always use your most recent med list.          acetaminophen 325 MG tablet Commonly known as:  TYLENOL Take 650 mg by mouth every 8 (eight) hours as needed.   baclofen  20 MG tablet Commonly known as:  LIORESAL Take 20 mg by mouth every 8 (eight) hours as needed for muscle spasms.   CALCIUM 600+D 600-800 MG-UNIT Tabs Generic drug:  Calcium Carb-Cholecalciferol Take 1 tablet by mouth daily.   docusate sodium 100 MG capsule Commonly known as:  COLACE Take 1 capsule (100 mg total) by mouth 2 (two) times daily.   HYDROcodone-acetaminophen 5-325 MG tablet Commonly known as:  NORCO Take 1 tablet by mouth every 6 (six) hours as needed for moderate pain.   losartan 100 MG tablet Commonly known as:  COZAAR Take 100 mg by mouth daily.   meloxicam 15 MG  tablet Commonly known as:  MOBIC Take 1 tablet (15 mg total) by mouth 2 (two) times daily as needed for pain.   metoCLOPramide 10 MG tablet Commonly known as:  REGLAN Take 10 mg by mouth every 8 (eight) hours as needed for nausea. If Zofran not effective   multivitamin tablet Take 1 tablet by mouth daily.   oxybutynin 5 MG tablet Commonly known as:  DITROPAN Take 1 tablet (5 mg total) by mouth 2 (two) times daily.   polyethylene glycol packet Commonly known as:  MIRALAX / GLYCOLAX Take 17 g by mouth See admin instructions. Once every other day prn   RITUXIMAB IV Pt uses every 6 months due October 2018   tiZANidine 4 MG tablet Commonly known as:  ZANAFLEX Take 4 mg by mouth every 6 (six) hours as needed for muscle spasms.       Review of Systems  Constitutional: Negative for activity change, appetite change, chills, diaphoresis and fever.  HENT: Negative for congestion, sneezing, sore throat, trouble swallowing and voice change.   Respiratory: Negative for apnea, cough, choking, chest tightness, shortness of breath and wheezing.   Cardiovascular: Negative for chest pain, palpitations and leg swelling.  Gastrointestinal: Negative for abdominal distention, abdominal pain, constipation, diarrhea and nausea.  Genitourinary: Negative for difficulty urinating, dysuria, frequency and urgency.  Musculoskeletal: Positive for arthralgias (typical arthritis). Negative for back pain, gait problem and myalgias.  Skin: Negative for color change, pallor, rash and wound.  Neurological: Positive for weakness. Negative for dizziness, tremors, syncope, speech difficulty, numbness and headaches.  Psychiatric/Behavioral: Negative for agitation and behavioral problems.  All other systems reviewed and are negative.   Immunization History  Administered Date(s) Administered  . PPD Test 07/14/2016, 07/18/2016  . Tdap 10/25/2015   Pertinent  Health Maintenance Due  Topic Date Due  . COLONOSCOPY   10/19/2011  . INFLUENZA VACCINE  12/06/2016  . PAP SMEAR  06/01/2018  . MAMMOGRAM  09/12/2018   No flowsheet data found. Functional Status Survey:    Vitals:   01/30/17 1133  BP: (!) 130/56  Pulse: 60  Resp: 20  Temp: 97.8 F (36.6 C)  SpO2: 97%  Weight: 244 lb (110.7 kg)  Height:  (1.803 m)   Body mass index is 34.03 kg/m. Physical Exam  Constitutional: She is oriented to person, place, and time. Vital signs are normal. She appears well-developed and well-nourished. She is active and cooperative. She does not appear ill. No distress.  HENT:  Head: Normocephalic and atraumatic.  Mouth/Throat: Uvula is midline, oropharynx is clear and moist and mucous membranes are normal. Mucous membranes are not pale, not dry and not cyanotic.  Eyes: Pupils are equal, round, and reactive to light. Conjunctivae, EOM and lids are normal.  Neck: Trachea normal, normal range of motion and full passive range of motion without pain.  Neck supple. No JVD present. No tracheal deviation, no edema and no erythema present. No thyromegaly present.  Cardiovascular: Normal rate, regular rhythm, normal heart sounds, intact distal pulses and normal pulses.  Exam reveals no gallop, no distant heart sounds and no friction rub.   No murmur heard. Pulmonary/Chest: Effort normal and breath sounds normal. No accessory muscle usage. No respiratory distress. She has no wheezes. She has no rales. She exhibits no tenderness.  Abdominal: Normal appearance and bowel sounds are normal. She exhibits no distension and no ascites. There is no tenderness.  Musculoskeletal: She exhibits no edema.       Left wrist: She exhibits decreased range of motion, tenderness and laceration (under the cast).  Expected osteoarthritis, stiffness; fingers warm. Cap refills WNL. Fingers freely mobile. Compartments soft  Neurological: She is alert and oriented to person, place, and time. She has normal strength.  Skin: Skin is warm, dry  and intact. No rash noted. She is not diaphoretic. No cyanosis or erythema. No pallor. Nails show no clubbing.  Psychiatric: She has a normal mood and affect. Her speech is normal and behavior is normal. Judgment and thought content normal. Cognition and memory are normal.  Nursing note and vitals reviewed.   Labs reviewed:  Recent Labs  03/27/16 0343 11/29/16 0000 01/16/17 0924 01/17/17 0452  NA 143  --  142 142  K 3.6  --  3.8 3.9  CL 112*  --  112* 110  CO2 28  --  25 27  GLUCOSE 130* 83 95 99  BUN 35*  --  26* 23*  CREATININE 0.67  --  0.61 0.62  CALCIUM 8.6*  --  8.9 8.8*    Recent Labs  04/21/16 1056 04/21/16 1102 07/20/16 1618 01/16/17 0924  AST 15  --  16 18  ALT 12  --  16 14  ALKPHOS 181* 178* 136* 99  BILITOT 0.5  --  0.4 0.6  PROT 7.3  --  7.3 7.0  ALBUMIN 4.3  --  4.4 3.8    Recent Labs  03/24/16 0903 01/16/17 0924 01/17/17 0452  WBC 11.9* 8.8 8.1  NEUTROABS 8.9*  --   --   HGB 12.7 12.7 12.6  HCT 36.8 36.9 36.3  MCV 78.5* 79.5* 79.5*  PLT 204 203 195   Lab Results  Component Value Date   TSH 2.520 11/29/2016   Lab Results  Component Value Date   HGBA1C 5.1 11/29/2016   Lab Results  Component Value Date   CHOL 187 11/29/2016   HDL 55 11/29/2016   LDLCALC 119 (H) 11/29/2016   TRIG 64 11/29/2016   CHOLHDL 3.4 11/29/2016    Significant Diagnostic Results in last 30 days:  Dg Wrist Complete Left  Result Date: 01/16/2017 CLINICAL DATA:  Left wrist pain after fall. EXAM: LEFT WRIST - COMPLETE 3+ VIEW COMPARISON:  None. FINDINGS: There is a comminuted, intra-articular fracture of the distal radius with dorsal angulation. Radiocarpal alignment and carpal alignment are preserved. Displaced fracture of the ulnar styloid. Bone mineralization is normal. Diffuse soft tissue swelling about the wrist. IMPRESSION: 1. Comminuted, intra-articular fracture of the distal radius with dorsal angulation. 2. Displaced ulnar styloid fracture. Electronically  Signed   By: Obie Dredge M.D.   On: 01/16/2017 09:21   Ct Wrist Left Wo Contrast  Result Date: 01/16/2017 CLINICAL DATA:  Fractures of the distal left radius and ulna. EXAM: CT OF THE LEFT WRIST WITHOUT CONTRAST TECHNIQUE: Multidetector CT imaging was performed according to  the standard protocol. Multiplanar CT image reconstructions were also generated. COMPARISON:  Radiographs dated 01/16/2017 FINDINGS: Bones/Joint/Cartilage There is a severely comminuted fracture of the distal left radius with dorsal impaction and slight dorsal displacement of the distal fragments. There is a stellate component of the fracture involving the articular surface of the distal radius. There are several displaced and rotated cortical fragments. There is a tiny avulsion from the dorsal aspect of the distal ulna. IMPRESSION: 1. Comminuted impacted dorsally displaced fracture of the distal radius as described. 2. Tiny avulsion from the distal ulna. Electronically Signed   By: Francene Boyers M.D.   On: 01/16/2017 10:49   Dg Chest Port 1 View  Result Date: 01/16/2017 CLINICAL DATA:  Preop EXAM: PORTABLE CHEST 1 VIEW COMPARISON:  10/02/2015 FINDINGS: Heart is borderline in size. Lungs are clear. No effusions or edema. No acute bony abnormality. IMPRESSION: Borderline heart size.  No active disease. Electronically Signed   By: Charlett Nose M.D.   On: 01/16/2017 10:11    Assessment/Plan 1. Multiple sclerosis (HCC)  Continue working with PO/OT  Ambulatory with walker  2. Closed fracture of left wrist with routine healing, subsequent encounter  Continue working with PT/OT  Ambulate with walker with platform  Continue Hydrocodone/APAP 5/325 mg po Q 6 hours prn pain  Continue Zanaflex 4 mg po Q 6 hours prn for muscle spasms  Ice prn  Monitor skin integrity and tissue perfusion Q shift   Family/ staff Communication:   Total Time:  Documentation:  Face to Face:  Family/Phone:   Labs/tests ordered:     Medication list reviewed and assessed for continued appropriateness. Monthly medication orders reviewed and signed.  Brynda Rim, NP-C Geriatrics [facility] [phone] [place], [place] [address] Cell Phone (Mon-Fri 8am-5pm):  [phone] On Call:  [phone] & follow prompts after 5pm & weekends Office Phone:  [phone] Office Fax:  [phone]

## 2017-02-01 DIAGNOSIS — S52302A Unspecified fracture of shaft of left radius, initial encounter for closed fracture: Secondary | ICD-10-CM | POA: Diagnosis not present

## 2017-02-05 ENCOUNTER — Encounter
Admission: RE | Admit: 2017-02-05 | Discharge: 2017-02-05 | Disposition: A | Discharge status: Home / Self Care | Payer: PPO | Source: Ambulatory Visit | Attending: Internal Medicine | Admitting: Internal Medicine

## 2017-02-06 ENCOUNTER — Encounter: Payer: Self-pay | Admitting: Gerontology

## 2017-02-06 ENCOUNTER — Non-Acute Institutional Stay (SKILLED_NURSING_FACILITY): Payer: PPO | Admitting: Gerontology

## 2017-02-06 DIAGNOSIS — G35 Multiple sclerosis: Secondary | ICD-10-CM

## 2017-02-06 DIAGNOSIS — S62102D Fracture of unspecified carpal bone, left wrist, subsequent encounter for fracture with routine healing: Secondary | ICD-10-CM

## 2017-02-06 NOTE — Progress Notes (Signed)
Location:   [facility] Room Number: 209B Place of Service:  SNF [phone]) Provider:  Lorenso Quarry, NP-C  Lauro Regulus, MD  Patient Care Team: Lauro Regulus, MD as PCP - General (Internal Medicine) Morene Crocker, MD as Referring Physician (Neurology)  Extended Emergency Contact Information Primary Emergency Contact: Louann Sjogren of [place] Home Phone: [phone] Work Phone: [phone] Relation: Sister Secondary Emergency Contact: Wilford Grist [place] Home Phone: [phone] Work Phone: [phone] Relation: Sister Mother: Peni, [place] Home Phone: [phone]  Code Status: FULL Goals of care: Advanced Directive information Advanced Directives 02/06/2017  Does Patient Have a Medical Advance Directive? No  Type of Advance Directive -  Does patient want to make changes to medical advance directive? -  Copy of Healthcare Power of Attorney in Chart? -     Chief Complaint  Patient presents with  . Medical Management of Chronic Issues    Routine Visit    HPI:  Pt is a 56 y.o. female seen today for medical management of chronic diseases. Pt was admitted to the facility after a fall with left wrist fracture and subsequent ORIF of the left wrist. She is participating in PT and OT for mobility. She is currently Non-weight bearing to the wrist. Her recovery is complicated by the comorbidity of MS. Pt is in good spirits. She reports her pain is well controlled on current regimen. Reports her appetite is good and having regular BMs. VSS. No other complaints.      Past Medical History:  Diagnosis Date  . Abdominal pain, right upper quadrant   . Back pain   . Calculus of kidney 12/09/2013  . Chronic back pain    unspecified  . Chronic left shoulder pain 07/19/2015  . Functional disorder of bladder    other  . Galactorrhea 11/26/2014   Chronic   . Hereditary and  idiopathic neuropathy 08/19/2013  . HPV test positive   . Hypercholesteremia 08/19/2013  . Incomplete bladder emptying   . Microscopic hematuria   . MS (multiple sclerosis) (HCC)   . Muscle spasticity 05/21/2014  . Nonspecific findings on examination of urine    other  . Osteopenia   . Status post laparoscopic supracervical hysterectomy 11/26/2014  . Tobacco user 11/26/2014  . Wrist fracture    Past Surgical History:  Procedure Laterality Date  . bilateral tubal ligation  1996  . BREAST CYST EXCISION Left 2002  . KNEE SURGERY     right  . LAPAROSCOPIC SUPRACERVICAL HYSTERECTOMY  08/05/2013  . ORIF WRIST FRACTURE Left 01/17/2017   Procedure: OPEN REDUCTION INTERNAL FIXATION (ORIF) WRIST FRACTURE;  Surgeon: Lyndle Herrlich, MD;  Location: [facility] ORS;  Service: Orthopedics;  Laterality: Left;  . TUBAL LIGATION Bilateral   . VAGINAL HYSTERECTOMY  03/2006    Allergies  Allergen Reactions  . Amlodipine Other (See Comments)    edema  . Povidone Iodine Rash    Allergies as of 02/06/2017      Reactions   Amlodipine Other (See Comments)   edema   Povidone Iodine Rash      Medication List       Accurate as of 02/06/17  9:55 AM. Always use your most recent med list.          acetaminophen 325 MG tablet Commonly known as:  TYLENOL Take 650 mg by mouth every 8 (eight) hours as needed. Do not exceed 3000 mg in 24 hrs  baclofen 20 MG tablet Commonly known as:  LIORESAL Take 20 mg by mouth every 8 (eight) hours as needed for muscle spasms.   CALCIUM 600+D 600-800 MG-UNIT Tabs Generic drug:  Calcium Carb-Cholecalciferol Take 1 tablet by mouth daily.   docusate sodium 100 MG capsule Commonly known as:  COLACE Take 1 capsule (100 mg total) by mouth 2 (two) times daily.   HYDROcodone-acetaminophen 5-325 MG tablet Commonly known as:  NORCO Take 1 tablet by mouth every 6 (six) hours as needed for moderate pain.   losartan 100 MG tablet Commonly known as:  COZAAR Take 100 mg by  mouth daily.   meloxicam 15 MG tablet Commonly known as:  MOBIC Take 1 tablet (15 mg total) by mouth 2 (two) times daily as needed for pain.   metoCLOPramide 10 MG tablet Commonly known as:  REGLAN Take 10 mg by mouth every 8 (eight) hours as needed for nausea. If Zofran not effective   multivitamin tablet Take 1 tablet by mouth daily.   oxybutynin 5 MG tablet Commonly known as:  DITROPAN Take 1 tablet (5 mg total) by mouth 2 (two) times daily.   polyethylene glycol packet Commonly known as:  MIRALAX / GLYCOLAX Take 17 g by mouth See admin instructions. Once every other day prn   RITUXIMAB IV Pt uses every 6 months due October 2018   tiZANidine 4 MG tablet Commonly known as:  ZANAFLEX Take 4 mg by mouth every 6 (six) hours as needed for muscle spasms.       Review of Systems  Constitutional: Negative for activity change, appetite change, chills, diaphoresis and fever.  HENT: Negative for congestion, sneezing, sore throat, trouble swallowing and voice change.   Respiratory: Negative for apnea, cough, choking, chest tightness, shortness of breath and wheezing.   Cardiovascular: Negative for chest pain, palpitations and leg swelling.  Gastrointestinal: Negative for abdominal distention, abdominal pain, constipation, diarrhea and nausea.  Genitourinary: Negative for difficulty urinating, dysuria, frequency and urgency.  Musculoskeletal: Positive for arthralgias (typical arthritis) and gait problem. Negative for back pain and myalgias.  Skin: Negative for color change, pallor, rash and wound.  Neurological: Positive for weakness. Negative for dizziness, tremors, syncope, speech difficulty, numbness and headaches.  Psychiatric/Behavioral: Negative for agitation and behavioral problems.  All other systems reviewed and are negative.   Immunization History  Administered Date(s) Administered  . PPD Test 07/14/2016, 07/18/2016  . Tdap 10/25/2015   Pertinent  Health Maintenance  Due  Topic Date Due  . COLONOSCOPY  10/19/2011  . INFLUENZA VACCINE  12/06/2016  . PAP SMEAR  06/01/2018  . MAMMOGRAM  09/12/2018   No flowsheet data found. Functional Status Survey:    Vitals:   02/06/17 0948  BP: 119/68  Pulse: (!) 59  Resp: 18  Temp: (!) 97.5 F (36.4 C)  SpO2: 99%  Weight: 241 lb 8 oz (109.5 kg)  Height:  (1.803 m)   Body mass index is 33.68 kg/m. Physical Exam  Constitutional: She is oriented to person, place, and time. Vital signs are normal. She appears well-developed and well-nourished. She is active and cooperative. She does not appear ill. No distress.  HENT:  Head: Normocephalic and atraumatic.  Mouth/Throat: Uvula is midline, oropharynx is clear and moist and mucous membranes are normal. Mucous membranes are not pale, not dry and not cyanotic.  Eyes: Pupils are equal, round, and reactive to light. Conjunctivae, EOM and lids are normal.  Neck: Trachea normal, normal range of motion and full passive  range of motion without pain. Neck supple. No JVD present. No tracheal deviation, no edema and no erythema present. No thyromegaly present.  Cardiovascular: Normal rate, regular rhythm, normal heart sounds, intact distal pulses and normal pulses.  Exam reveals no gallop, no distant heart sounds and no friction rub.   No murmur heard. Pulses:      Radial pulses are 2+ on the right side, and 2+ on the left side.       Dorsalis pedis pulses are 2+ on the right side, and 2+ on the left side.  Pulmonary/Chest: Effort normal and breath sounds normal. No accessory muscle usage. No respiratory distress. She has no decreased breath sounds. She has no wheezes. She has no rhonchi. She has no rales. She exhibits no tenderness.  Abdominal: Normal appearance and bowel sounds are normal. She exhibits no distension and no ascites. There is no tenderness.  Musculoskeletal: She exhibits no edema.       Left wrist: She exhibits decreased range of motion and tenderness.   Expected osteoarthritis, stiffness. Calves soft, supple. Negative Homan's sign  Neurological: She is alert and oriented to person, place, and time. She has normal strength. She exhibits abnormal muscle tone. Coordination and gait abnormal.  Skin: Skin is warm, dry and intact. She is not diaphoretic. No cyanosis. No pallor. Nails show no clubbing.  Psychiatric: She has a normal mood and affect. Her speech is normal and behavior is normal. Judgment and thought content normal. Cognition and memory are normal.  Nursing note and vitals reviewed.   Labs reviewed:  Recent Labs  03/27/16 0343 11/29/16 0000 01/16/17 0924 01/17/17 0452  NA 143  --  142 142  K 3.6  --  3.8 3.9  CL 112*  --  112* 110  CO2 28  --  25 27  GLUCOSE 130* 83 95 99  BUN 35*  --  26* 23*  CREATININE 0.67  --  0.61 0.62  CALCIUM 8.6*  --  8.9 8.8*    Recent Labs  04/21/16 1056 04/21/16 1102 07/20/16 1618 01/16/17 0924  AST 15  --  16 18  ALT 12  --  16 14  ALKPHOS 181* 178* 136* 99  BILITOT 0.5  --  0.4 0.6  PROT 7.3  --  7.3 7.0  ALBUMIN 4.3  --  4.4 3.8    Recent Labs  03/24/16 0903 01/16/17 0924 01/17/17 0452  WBC 11.9* 8.8 8.1  NEUTROABS 8.9*  --   --   HGB 12.7 12.7 12.6  HCT 36.8 36.9 36.3  MCV 78.5* 79.5* 79.5*  PLT 204 203 195   Lab Results  Component Value Date   TSH 2.520 11/29/2016   Lab Results  Component Value Date   HGBA1C 5.1 11/29/2016   Lab Results  Component Value Date   CHOL 187 11/29/2016   HDL 55 11/29/2016   LDLCALC 119 (H) 11/29/2016   TRIG 64 11/29/2016   CHOLHDL 3.4 11/29/2016    Significant Diagnostic Results in last 30 days:  Dg Wrist Complete Left  Result Date: 01/16/2017 CLINICAL DATA:  Left wrist pain after fall. EXAM: LEFT WRIST - COMPLETE 3+ VIEW COMPARISON:  None. FINDINGS: There is a comminuted, intra-articular fracture of the distal radius with dorsal angulation. Radiocarpal alignment and carpal alignment are preserved. Displaced fracture of the  ulnar styloid. Bone mineralization is normal. Diffuse soft tissue swelling about the wrist. IMPRESSION: 1. Comminuted, intra-articular fracture of the distal radius with dorsal angulation. 2. Displaced ulnar styloid  fracture. Electronically Signed   By: Obie Dredge M.D.   On: 01/16/2017 09:21   Ct Wrist Left Wo Contrast  Result Date: 01/16/2017 CLINICAL DATA:  Fractures of the distal left radius and ulna. EXAM: CT OF THE LEFT WRIST WITHOUT CONTRAST TECHNIQUE: Multidetector CT imaging was performed according to the standard protocol. Multiplanar CT image reconstructions were also generated. COMPARISON:  Radiographs dated 01/16/2017 FINDINGS: Bones/Joint/Cartilage There is a severely comminuted fracture of the distal left radius with dorsal impaction and slight dorsal displacement of the distal fragments. There is a stellate component of the fracture involving the articular surface of the distal radius. There are several displaced and rotated cortical fragments. There is a tiny avulsion from the dorsal aspect of the distal ulna. IMPRESSION: 1. Comminuted impacted dorsally displaced fracture of the distal radius as described. 2. Tiny avulsion from the distal ulna. Electronically Signed   By: Francene Boyers M.D.   On: 01/16/2017 10:49   Dg Chest Port 1 View  Result Date: 01/16/2017 CLINICAL DATA:  Preop EXAM: PORTABLE CHEST 1 VIEW COMPARISON:  10/02/2015 FINDINGS: Heart is borderline in size. Lungs are clear. No effusions or edema. No acute bony abnormality. IMPRESSION: Borderline heart size.  No active disease. Electronically Signed   By: Charlett Nose M.D.   On: 01/16/2017 10:11    Assessment/Plan 1. Multiple Sclerosis  Continue working with PT/OT  Ambulatory with Platform walker  2. Closed fracture of left wrist with routine healing, subsequent encounter  Continue working with PT/OT  Ambulate with platform walker  Continue hydrocodone/APAP 5/325 mg po Q 6 hours prn pain  Continue Zanaflex  4 mg po Q 6 hrs prn for muscle spasms  Ice prn  Elevate wrist when at rest  Monitor skin integrity and tissue perfusion Q shift   Family/ staff Communication:   Total Time:  Documentation:  Face to Face:  Family/Phone:   Labs/tests ordered:    Medication list reviewed and assessed for continued appropriateness. Monthly medication orders reviewed and signed.  Brynda Rim, NP-C Geriatrics [facility] [phone] [address] [place], [place] [address] Cell Phone (Mon-Fri 8am-5pm):  [phone] On Call:  [phone] & follow prompts after 5pm & weekends Office Phone:  [phone] Office Fax:  [phone]

## 2017-02-14 ENCOUNTER — Non-Acute Institutional Stay (SKILLED_NURSING_FACILITY): Payer: PPO | Admitting: Gerontology

## 2017-02-14 ENCOUNTER — Encounter: Payer: Self-pay | Admitting: Gerontology

## 2017-02-14 DIAGNOSIS — S62102D Fracture of unspecified carpal bone, left wrist, subsequent encounter for fracture with routine healing: Secondary | ICD-10-CM | POA: Diagnosis not present

## 2017-02-14 DIAGNOSIS — G35 Multiple sclerosis: Secondary | ICD-10-CM | POA: Diagnosis not present

## 2017-02-14 NOTE — Progress Notes (Signed)
Location:   [facility] Room Number: 209B Place of Service:  SNF [phone]) Provider:  Lorenso Quarry, NP-C  Lauro Regulus, MD  Patient Care Team: Lauro Regulus, MD as PCP - General (Internal Medicine) Morene Crocker, MD as Referring Physician (Neurology)  Extended Emergency Contact Information Primary Emergency Contact: Louann Sjogren of [place] Home Phone: [phone] Work Phone: [phone] Relation: Sister Secondary Emergency Contact: Wilford Grist [place] Home Phone: [phone] Work Phone: [phone] Relation: Sister Mother: Kylynn, Street [place] of [place] Home Phone: [phone]  Code Status:  FULL Goals of care: Advanced Directive information Advanced Directives 02/14/2017  Does Patient Have a Medical Advance Directive? No  Type of Advance Directive -  Does patient want to make changes to medical advance directive? -  Copy of Healthcare Power of Attorney in Chart? -     Chief Complaint  Patient presents with  . Medical Management of Chronic Issues    Routine Visit    HPI:  Pt is a 56 y.o. female seen today for medical management of chronic diseases. Pt is at the facility for rehab after an ORIF of the left wrist from fracture r/t a fall. Recovery is complicated by Non-weight bearing status and comorbidity of MS. Pt is unable to safely stand independently and is unable to progress with PT. Pt is optimistic this will improve with increased weight bearing status. She continues to be good spirits, pain is well controlled with current regimen. Appetite is good and having regular BMs. VSS. No other complaints.    Past Medical History:  Diagnosis Date  . Abdominal pain, right upper quadrant   . Back pain   . Calculus of kidney 12/09/2013  . Chronic back pain    unspecified  . Chronic left shoulder pain 07/19/2015  . Functional disorder of bladder    other  . Galactorrhea  11/26/2014   Chronic   . Hereditary and idiopathic neuropathy 08/19/2013  . HPV test positive   . Hypercholesteremia 08/19/2013  . Incomplete bladder emptying   . Microscopic hematuria   . MS (multiple sclerosis) (HCC)   . Muscle spasticity 05/21/2014  . Nonspecific findings on examination of urine    other  . Osteopenia   . Status post laparoscopic supracervical hysterectomy 11/26/2014  . Tobacco user 11/26/2014  . Wrist fracture    Past Surgical History:  Procedure Laterality Date  . bilateral tubal ligation  1996  . BREAST CYST EXCISION Left 2002  . KNEE SURGERY     right  . LAPAROSCOPIC SUPRACERVICAL HYSTERECTOMY  08/05/2013  . ORIF WRIST FRACTURE Left 01/17/2017   Procedure: OPEN REDUCTION INTERNAL FIXATION (ORIF) WRIST FRACTURE;  Surgeon: Lyndle Herrlich, MD;  Location: [facility] ORS;  Service: Orthopedics;  Laterality: Left;  . TUBAL LIGATION Bilateral   . VAGINAL HYSTERECTOMY  03/2006    Allergies  Allergen Reactions  . Amlodipine Other (See Comments)    edema  . Povidone Iodine Rash    Allergies as of 02/14/2017      Reactions   Amlodipine Other (See Comments)   edema   Povidone Iodine Rash      Medication List       Accurate as of 02/14/17  9:24 AM. Always use your most recent med list.          acetaminophen 325 MG tablet Commonly known as:  TYLENOL Take 650 mg by mouth every 8 (eight) hours as needed. Do not exceed 3000  mg in 24 hrs   baclofen 20 MG tablet Commonly known as:  LIORESAL Take 20 mg by mouth every 8 (eight) hours as needed for muscle spasms.   CALCIUM 600+D 600-800 MG-UNIT Tabs Generic drug:  Calcium Carb-Cholecalciferol Take 1 tablet by mouth daily.   docusate sodium 100 MG capsule Commonly known as:  COLACE Take 1 capsule (100 mg total) by mouth 2 (two) times daily.   HYDROcodone-acetaminophen 5-325 MG tablet Commonly known as:  NORCO Take 1 tablet by mouth every 6 (six) hours as needed for moderate pain.   losartan 100 MG  tablet Commonly known as:  COZAAR Take 100 mg by mouth daily.   meloxicam 15 MG tablet Commonly known as:  MOBIC Take 1 tablet (15 mg total) by mouth 2 (two) times daily as needed for pain.   metoCLOPramide 10 MG tablet Commonly known as:  REGLAN Take 10 mg by mouth every 8 (eight) hours as needed for nausea. If Zofran not effective   multivitamin tablet Take 1 tablet by mouth daily.   oxybutynin 5 MG tablet Commonly known as:  DITROPAN Take 1 tablet (5 mg total) by mouth 2 (two) times daily.   polyethylene glycol packet Commonly known as:  MIRALAX / GLYCOLAX Take 17 g by mouth See admin instructions. Once every other day prn   RITUXIMAB IV Pt uses every 6 months due October 2018   tiZANidine 4 MG tablet Commonly known as:  ZANAFLEX Take 4 mg by mouth every 6 (six) hours as needed for muscle spasms.       Review of Systems  Constitutional: Negative for activity change, appetite change, chills, diaphoresis and fever.  HENT: Negative for congestion, sneezing, sore throat, trouble swallowing and voice change.   Respiratory: Negative for apnea, cough, choking, chest tightness, shortness of breath and wheezing.   Cardiovascular: Negative for chest pain, palpitations and leg swelling.  Gastrointestinal: Negative for abdominal distention, abdominal pain, constipation, diarrhea and nausea.  Genitourinary: Negative for difficulty urinating, dysuria, frequency and urgency.  Musculoskeletal: Positive for arthralgias (typical arthritis) and gait problem. Negative for back pain and myalgias.  Skin: Negative for color change, pallor, rash and wound.  Neurological: Positive for weakness. Negative for dizziness, tremors, syncope, speech difficulty, numbness and headaches.  Psychiatric/Behavioral: Negative for agitation and behavioral problems.  All other systems reviewed and are negative.   Immunization History  Administered Date(s) Administered  . PPD Test 07/14/2016, 07/18/2016  .  Tdap 10/25/2015   Pertinent  Health Maintenance Due  Topic Date Due  . COLONOSCOPY  10/19/2011  . INFLUENZA VACCINE  12/06/2016  . PAP SMEAR  06/01/2018  . MAMMOGRAM  09/12/2018   No flowsheet data found. Functional Status Survey:    Vitals:   02/14/17 0910  BP: 132/60  Pulse: 61  Resp: 20  Temp: 98.2 F (36.8 C)  SpO2: 100%  Weight: 245 lb (111.1 kg)  Height: 5\' 11"  (1.803 m)   Body mass index is 34.17 kg/m. Physical Exam  Constitutional: She is oriented to person, place, and time. Vital signs are normal. She appears well-developed and well-nourished. She is active and cooperative. She does not appear ill. No distress.  HENT:  Head: Normocephalic and atraumatic.  Mouth/Throat: Uvula is midline, oropharynx is clear and moist and mucous membranes are normal. Mucous membranes are not pale, not dry and not cyanotic.  Eyes: Pupils are equal, round, and reactive to light. Conjunctivae, EOM and lids are normal.  Neck: Trachea normal, normal range of motion and  full passive range of motion without pain. Neck supple. No JVD present. No tracheal deviation, no edema and no erythema present. No thyromegaly present.  Cardiovascular: Normal rate, regular rhythm, normal heart sounds, intact distal pulses and normal pulses.  Exam reveals no gallop, no distant heart sounds and no friction rub.   No murmur heard. Pulses:      Radial pulses are 2+ on the right side, and 2+ on the left side.       Dorsalis pedis pulses are 2+ on the right side, and 2+ on the left side.  Pulmonary/Chest: Effort normal. No accessory muscle usage. No respiratory distress. She has no wheezes. She has no rhonchi. She has no rales. She exhibits no tenderness.  Abdominal: Normal appearance and bowel sounds are normal. She exhibits no distension and no ascites. There is no tenderness.  Musculoskeletal: She exhibits no edema.       Left wrist: She exhibits decreased range of motion.  Expected osteoarthritis, stiffness   Neurological: She is alert and oriented to person, place, and time. She has normal strength. She exhibits abnormal muscle tone. Coordination and gait abnormal.  Skin: Skin is warm, dry and intact. She is not diaphoretic. No cyanosis. No pallor. Nails show no clubbing.  Psychiatric: She has a normal mood and affect. Her speech is normal and behavior is normal. Judgment and thought content normal. Cognition and memory are normal.  Nursing note and vitals reviewed.   Labs reviewed:  Recent Labs  03/27/16 0343 11/29/16 0000 01/16/17 0924 01/17/17 0452  NA 143  --  142 142  K 3.6  --  3.8 3.9  CL 112*  --  112* 110  CO2 28  --  25 27  GLUCOSE 130* 83 95 99  BUN 35*  --  26* 23*  CREATININE 0.67  --  0.61 0.62  CALCIUM 8.6*  --  8.9 8.8*    Recent Labs  04/21/16 1056 04/21/16 1102 07/20/16 1618 01/16/17 0924  AST 15  --  16 18  ALT 12  --  16 14  ALKPHOS 181* 178* 136* 99  BILITOT 0.5  --  0.4 0.6  PROT 7.3  --  7.3 7.0  ALBUMIN 4.3  --  4.4 3.8    Recent Labs  03/24/16 0903 01/16/17 0924 01/17/17 0452  WBC 11.9* 8.8 8.1  NEUTROABS 8.9*  --   --   HGB 12.7 12.7 12.6  HCT 36.8 36.9 36.3  MCV 78.5* 79.5* 79.5*  PLT 204 203 195   Lab Results  Component Value Date   TSH 2.520 11/29/2016   Lab Results  Component Value Date   HGBA1C 5.1 11/29/2016   Lab Results  Component Value Date   CHOL 187 11/29/2016   HDL 55 11/29/2016   LDLCALC 119 (H) 11/29/2016   TRIG 64 11/29/2016   CHOLHDL 3.4 11/29/2016    Significant Diagnostic Results in last 30 days:  Dg Wrist Complete Left  Result Date: 01/16/2017 CLINICAL DATA:  Left wrist pain after fall. EXAM: LEFT WRIST - COMPLETE 3+ VIEW COMPARISON:  None. FINDINGS: There is a comminuted, intra-articular fracture of the distal radius with dorsal angulation. Radiocarpal alignment and carpal alignment are preserved. Displaced fracture of the ulnar styloid. Bone mineralization is normal. Diffuse soft tissue swelling about  the wrist. IMPRESSION: 1. Comminuted, intra-articular fracture of the distal radius with dorsal angulation. 2. Displaced ulnar styloid fracture. Electronically Signed   By: Obie Dredge M.D.   On: 01/16/2017 09:21  Ct Wrist Left Wo Contrast  Result Date: 01/16/2017 CLINICAL DATA:  Fractures of the distal left radius and ulna. EXAM: CT OF THE LEFT WRIST WITHOUT CONTRAST TECHNIQUE: Multidetector CT imaging was performed according to the standard protocol. Multiplanar CT image reconstructions were also generated. COMPARISON:  Radiographs dated 01/16/2017 FINDINGS: Bones/Joint/Cartilage There is a severely comminuted fracture of the distal left radius with dorsal impaction and slight dorsal displacement of the distal fragments. There is a stellate component of the fracture involving the articular surface of the distal radius. There are several displaced and rotated cortical fragments. There is a tiny avulsion from the dorsal aspect of the distal ulna. IMPRESSION: 1. Comminuted impacted dorsally displaced fracture of the distal radius as described. 2. Tiny avulsion from the distal ulna. Electronically Signed   By: Francene Boyers M.D.   On: 01/16/2017 10:49   Dg Chest Port 1 View  Result Date: 01/16/2017 CLINICAL DATA:  Preop EXAM: PORTABLE CHEST 1 VIEW COMPARISON:  10/02/2015 FINDINGS: Heart is borderline in size. Lungs are clear. No effusions or edema. No acute bony abnormality. IMPRESSION: Borderline heart size.  No active disease. Electronically Signed   By: Charlett Nose M.D.   On: 01/16/2017 10:11    Assessment/Plan 1. Multiple sclerosis (HCC)  Continue working with PT and OT  Ambulatory with platform walker  2. Closed fracture of left wrist with routine healing, subsequent encounter  Continue working with PT/OT  Ambulatory with platform walker  Continue Hydrocodone/APAP 5/325 mg po Q 6 hrs prn  Continue Zanaflex 4 mg po Q 6 hrs prn for muscle spasms  Ice prn  Elevate arm when at  rest  Monitor skin integrity and tissue perfusion Q shift  Family/ staff Communication:   Total Time:  Documentation:  Face to Face:  Family/Phone:   Labs/tests ordered:    Medication list reviewed and assessed for continued appropriateness. Monthly medication orders reviewed and signed.  Brynda Rim, NP-C Geriatrics [facility] [phone] [address], [place] [address] Cell Phone (Mon-Fri 8am-5pm):  [phone] On Call:  [phone] & follow prompts after 5pm & weekends Office Phone:  [phone] Office Fax:  [phone]

## 2017-02-21 ENCOUNTER — Other Ambulatory Visit: Payer: Self-pay

## 2017-02-21 NOTE — Patient Outreach (Signed)
[facility]) Care Management  02/21/2017  Lisa Williams [birth year] [id number]     Transition of Care Referral  Referral Date: 02/21/17 Referral Source: [facility] Discharge Report Date of Admission: [facility] :01/16/17 to 01/18/17 Diagnosis: wrist fracture, UTI Date of Discharge:  02/15/17 Facility: [facility] Insurance: HTA   Outreach attempt # 1 to patient. Spoke with patient. Patient states she is still currently at [facility] facility getting rehab and therapy. She voices she is unsure how much longer she will remain there. She states that she was told that she needed to be "weight bearing" and using walker before she leaves. She voices that she is still not able to do those things as of yet. She reports that she is in the process of appealing to insurance so she can get an extension on her stay at facility. She stets she will know more about how she is progressing and doing when she goes for her f/u appt on 03/01/17. She voices no RN CM needs or concerns at this time.     Plan: RN CM will notify [facility] administrative assistant of case status.    Antionette Fairy, RN,BSN,CCM [facility] Care Management Telephonic Care Management Coordinator Direct Phone: [phone] Toll Free: [phone] Fax: [phone]

## 2017-03-01 ENCOUNTER — Non-Acute Institutional Stay (SKILLED_NURSING_FACILITY): Payer: PPO | Admitting: Gerontology

## 2017-03-01 DIAGNOSIS — S52532D Colles' fracture of left radius, subsequent encounter for closed fracture with routine healing: Secondary | ICD-10-CM | POA: Diagnosis not present

## 2017-03-01 DIAGNOSIS — S62102D Fracture of unspecified carpal bone, left wrist, subsequent encounter for fracture with routine healing: Secondary | ICD-10-CM

## 2017-03-01 DIAGNOSIS — S8012XA Contusion of left lower leg, initial encounter: Secondary | ICD-10-CM | POA: Diagnosis not present

## 2017-03-01 DIAGNOSIS — M79662 Pain in left lower leg: Secondary | ICD-10-CM | POA: Diagnosis not present

## 2017-03-01 DIAGNOSIS — G35 Multiple sclerosis: Secondary | ICD-10-CM

## 2017-03-02 ENCOUNTER — Encounter: Payer: Self-pay | Admitting: Gerontology

## 2017-03-02 NOTE — Progress Notes (Signed)
Location:   [facility] Room Number: 202A Place of Service:  SNF [phone]) Provider:  Lorenso QuarryShannon Drina Jobst, NP-C  Lauro RegulusAnderson, Marshall W, MD  Patient Care Team: Lauro RegulusAnderson, Marshall W, MD as PCP - General (Internal Medicine) Morene CrockerPotter, Zachary E, MD as Referring Physician (Neurology)  Extended Emergency Contact Information Primary Emergency Contact: Louann Sjogrenamseur,Sheila  [place] Home Phone: [phone] Work Phone: [phone] Relation: Sister Secondary Emergency Contact: Wilford Gristamseur,Sharon  [place] Home Phone: [phone] Work Phone: [phone] Relation: Sister Mother: Remigio EisenmengerRamseur,Margaret  [place] Home Phone: [phone]  Code Status:  FULL Goals of care: Advanced Directive information Advanced Directives 03/01/2017  Does Patient Have a Medical Advance Directive? No  Type of Advance Directive -  Does patient want to make changes to medical advance directive? -  Copy of Healthcare Power of Attorney in Chart? -     Chief Complaint  Patient presents with  . Acute Visit    Follow up on leg sore    HPI:  Pt is a 56 y.o. female seen today for an acute visit for pain in the right anterior leg. Pt reports she hit her log on the lift equipment last week and her leg has been tender since then. There is a scabbed abrasion on the anterior, proximal tibial area that is tender to palpation. No obvious deformity, no crepitus. Pt is very concerned about a potential fracture and "doesn't want anything to mess up her PT." She had a recent visit with the Orthopedist and received increased weight bearing status of the wrist. Otherwise, pt continues to be without pain. She is concerned about insurance stopping and having to leave the facility soon. VSS.    Past Medical History:  Diagnosis Date  . Abdominal pain, right upper quadrant   . Back pain   . Calculus of kidney 12/09/2013  . Chronic back pain    unspecified  . Chronic left  shoulder pain 07/19/2015  . Functional disorder of bladder    other  . Galactorrhea 11/26/2014   Chronic   . Hereditary and idiopathic neuropathy 08/19/2013  . HPV test positive   . Hypercholesteremia 08/19/2013  . Incomplete bladder emptying   . Microscopic hematuria   . MS (multiple sclerosis) (HCC)   . Muscle spasticity 05/21/2014  . Nonspecific findings on examination of urine    other  . Osteopenia   . Status post laparoscopic supracervical hysterectomy 11/26/2014  . Tobacco user 11/26/2014  . Wrist fracture    Past Surgical History:  Procedure Laterality Date  . bilateral tubal ligation  1996  . BREAST CYST EXCISION Left 2002  . KNEE SURGERY     right  . LAPAROSCOPIC SUPRACERVICAL HYSTERECTOMY  08/05/2013  . ORIF WRIST FRACTURE Left 01/17/2017   Procedure: OPEN REDUCTION INTERNAL FIXATION (ORIF) WRIST FRACTURE;  Surgeon: Lyndle HerrlichBowers, James R, MD;  Location: [facility] ORS;  Service: Orthopedics;  Laterality: Left;  . TUBAL LIGATION Bilateral   . VAGINAL HYSTERECTOMY  03/2006    Allergies  Allergen Reactions  . Amlodipine Other (See Comments)    edema  . Povidone Iodine Rash    Allergies as of 03/01/2017      Reactions   Amlodipine Other (See Comments)   edema   Povidone Iodine Rash      Medication List       Accurate as of 03/01/17 11:59 PM. Always use your most recent med list.          acetaminophen 325 MG  tablet Commonly known as:  TYLENOL Take 650 mg by mouth every 8 (eight) hours as needed. Do not exceed 3000 mg in 24 hrs   baclofen 20 MG tablet Commonly known as:  LIORESAL Take 20 mg by mouth every 8 (eight) hours as needed for muscle spasms.   CALCIUM 600+D 600-800 MG-UNIT Tabs Generic drug:  Calcium Carb-Cholecalciferol Take 1 tablet by mouth daily.   docusate sodium 100 MG capsule Commonly known as:  COLACE Take 100 mg by mouth daily as needed for mild constipation.   HYDROcodone-acetaminophen 5-325 MG tablet Commonly known as:  NORCO Take 1 tablet  by mouth every 6 (six) hours as needed for moderate pain.   losartan 100 MG tablet Commonly known as:  COZAAR Take 100 mg by mouth daily.   meloxicam 15 MG tablet Commonly known as:  MOBIC Take 1 tablet (15 mg total) by mouth 2 (two) times daily as needed for pain.   metoCLOPramide 10 MG tablet Commonly known as:  REGLAN Take 10 mg by mouth every 8 (eight) hours as needed for nausea. If Zofran not effective   multivitamin tablet Take 1 tablet by mouth daily.   oxybutynin 5 MG tablet Commonly known as:  DITROPAN Take 1 tablet (5 mg total) by mouth 2 (two) times daily.   polyethylene glycol packet Commonly known as:  MIRALAX / GLYCOLAX Take 17 g by mouth See admin instructions. Once every other day prn   RITUXIMAB IV Pt uses every 6 months due October 2018   tiZANidine 4 MG tablet Commonly known as:  ZANAFLEX Take 4 mg by mouth every 6 (six) hours as needed for muscle spasms.       Review of Systems  Constitutional: Negative for activity change, appetite change, chills, diaphoresis and fever.  HENT: Negative for congestion, sneezing, sore throat, trouble swallowing and voice change.   Respiratory: Negative for apnea, cough, choking, chest tightness, shortness of breath and wheezing.   Cardiovascular: Negative for chest pain, palpitations and leg swelling.  Gastrointestinal: Negative for abdominal distention, abdominal pain, constipation, diarrhea and nausea.  Genitourinary: Negative for difficulty urinating, dysuria, frequency and urgency.  Musculoskeletal: Positive for arthralgias (typical arthritis) and gait problem. Negative for back pain and myalgias.  Skin: Negative for color change, pallor, rash and wound.  Neurological: Positive for weakness. Negative for dizziness, tremors, syncope, speech difficulty, numbness and headaches.  Psychiatric/Behavioral: Negative for agitation and behavioral problems.  All other systems reviewed and are negative.   Immunization  History  Administered Date(s) Administered  . PPD Test 07/14/2016, 07/18/2016  . Tdap 10/25/2015   Pertinent  Health Maintenance Due  Topic Date Due  . COLONOSCOPY  10/19/2011  . INFLUENZA VACCINE  12/06/2016  . PAP SMEAR  06/01/2018  . MAMMOGRAM  09/12/2018   No flowsheet data found. Functional Status Survey:    Vitals:   03/01/17 1131  BP: 132/73  Pulse: 74  Resp: 18  Temp: 98.1 F (36.7 C)  SpO2: 100%  Weight: 244 lb 1.6 oz (110.7 kg)  Height: 5\' 11"  (1.803 m)   Body mass index is 34.05 kg/m. Physical Exam  Constitutional: She is oriented to person, place, and time. Vital signs are normal. She appears well-developed and well-nourished. She is active and cooperative. She does not appear ill. No distress.  HENT:  Head: Normocephalic and atraumatic.  Mouth/Throat: Uvula is midline, oropharynx is clear and moist and mucous membranes are normal. Mucous membranes are not pale, not dry and not cyanotic.  Eyes: Pupils  are equal, round, and reactive to light. Conjunctivae, EOM and lids are normal.  Neck: Trachea normal, normal range of motion and full passive range of motion without pain. Neck supple. No JVD present. No tracheal deviation, no edema and no erythema present. No thyromegaly present.  Cardiovascular: Normal rate, regular rhythm, intact distal pulses and normal pulses.  Exam reveals no gallop, no distant heart sounds and no friction rub.   No murmur heard. Pulses:      Radial pulses are 2+ on the right side, and 2+ on the left side.       Dorsalis pedis pulses are 2+ on the right side, and 2+ on the left side.  Pulmonary/Chest: Effort normal and breath sounds normal. No accessory muscle usage. No respiratory distress. She has no wheezes. She has no rhonchi. She has no rales. She exhibits no tenderness.  Abdominal: Normal appearance and bowel sounds are normal. She exhibits no distension and no ascites. There is no tenderness.  Musculoskeletal: She exhibits no edema.         Left wrist: She exhibits decreased range of motion.       Left lower leg: She exhibits tenderness.       Legs: Expected osteoarthritis, stiffness. Calves soft, supple. Negative Homan's sign  Neurological: She is alert and oriented to person, place, and time. She has normal strength. She exhibits abnormal muscle tone. Coordination and gait abnormal.  Skin: Skin is warm and dry. Abrasion noted. She is not diaphoretic. No cyanosis. No pallor. Nails show no clubbing.     Psychiatric: She has a normal mood and affect. Her speech is normal and behavior is normal. Judgment and thought content normal. Cognition and memory are normal.  Nursing note and vitals reviewed.   Labs reviewed:  Recent Labs  03/27/16 0343 11/29/16 0000 01/16/17 0924 01/17/17 0452  NA 143  --  142 142  K 3.6  --  3.8 3.9  CL 112*  --  112* 110  CO2 28  --  25 27  GLUCOSE 130* 83 95 99  BUN 35*  --  26* 23*  CREATININE 0.67  --  0.61 0.62  CALCIUM 8.6*  --  8.9 8.8*    Recent Labs  04/21/16 1056 04/21/16 1102 07/20/16 1618 01/16/17 0924  AST 15  --  16 18  ALT 12  --  16 14  ALKPHOS 181* 178* 136* 99  BILITOT 0.5  --  0.4 0.6  PROT 7.3  --  7.3 7.0  ALBUMIN 4.3  --  4.4 3.8    Recent Labs  03/24/16 0903 01/16/17 0924 01/17/17 0452  WBC 11.9* 8.8 8.1  NEUTROABS 8.9*  --   --   HGB 12.7 12.7 12.6  HCT 36.8 36.9 36.3  MCV 78.5* 79.5* 79.5*  PLT 204 203 195   Lab Results  Component Value Date   TSH 2.520 11/29/2016   Lab Results  Component Value Date   HGBA1C 5.1 11/29/2016   Lab Results  Component Value Date   CHOL 187 11/29/2016   HDL 55 11/29/2016   LDLCALC 119 (H) 11/29/2016   TRIG 64 11/29/2016   CHOLHDL 3.4 11/29/2016    Significant Diagnostic Results in last 30 days:  No results found.  Assessment/Plan 1. Contusion of left lower leg, initial encounter  Tibial xray (NEGATVE)  Ice prn  2. Multiple sclerosis (HCC)  Continue working with PT/OT  Ambulatory with  platform walker  3. Closed fracture of left wrist with routine  healing, subsequent encounter  Continue working with PT/OT  Ambulatory with platform walker  Now Weight-Bearing as tolerated  Continue Hydrocodone/APAP 5/325 mg po Q 6 hours prn pain  Continue Zanaflex 4 mg po Q 6 hours prn spasms  Ice prn  Elevate wrist when at rest  Monitor skin integrity and tissue perfusion Q shift   Family/ staff Communication:   Total Time:  Documentation:  Face to Face:  Family/Phone:   Labs/tests ordered:  Left Tibial xray  Medication list reviewed and assessed for continued appropriateness.  Brynda Rim, NP-C Geriatrics [facility] [phone] [address], [place] [address] Cell Phone (Mon-Fri 8am-5pm):  [phone] On Call:  [phone] & follow prompts after 5pm & weekends Office Phone:  [phone] Office Fax:  [phone]

## 2017-03-02 NOTE — Progress Notes (Signed)
Opened in error; Disregard.

## 2017-03-05 ENCOUNTER — Inpatient Hospital Stay
Admission: EM | Admit: 2017-03-05 | Discharge: 2017-03-14 | DRG: 060 | Disposition: A | Discharge status: Home Health | Payer: PPO | Attending: Internal Medicine | Admitting: Internal Medicine

## 2017-03-05 DIAGNOSIS — G609 Hereditary and idiopathic neuropathy, unspecified: Secondary | ICD-10-CM | POA: Diagnosis present

## 2017-03-05 DIAGNOSIS — E7800 Pure hypercholesterolemia, unspecified: Secondary | ICD-10-CM | POA: Diagnosis not present

## 2017-03-05 DIAGNOSIS — Z7401 Bed confinement status: Secondary | ICD-10-CM | POA: Diagnosis not present

## 2017-03-05 DIAGNOSIS — N319 Neuromuscular dysfunction of bladder, unspecified: Secondary | ICD-10-CM | POA: Diagnosis present

## 2017-03-05 DIAGNOSIS — I10 Essential (primary) hypertension: Secondary | ICD-10-CM | POA: Diagnosis present

## 2017-03-05 DIAGNOSIS — W19XXXD Unspecified fall, subsequent encounter: Secondary | ICD-10-CM | POA: Diagnosis present

## 2017-03-05 DIAGNOSIS — K5900 Constipation, unspecified: Secondary | ICD-10-CM | POA: Diagnosis present

## 2017-03-05 DIAGNOSIS — S62102A Fracture of unspecified carpal bone, left wrist, initial encounter for closed fracture: Secondary | ICD-10-CM | POA: Diagnosis not present

## 2017-03-05 DIAGNOSIS — Z6834 Body mass index (BMI) 34.0-34.9, adult: Secondary | ICD-10-CM | POA: Diagnosis not present

## 2017-03-05 DIAGNOSIS — Z79899 Other long term (current) drug therapy: Secondary | ICD-10-CM

## 2017-03-05 DIAGNOSIS — Z87891 Personal history of nicotine dependence: Secondary | ICD-10-CM | POA: Diagnosis not present

## 2017-03-05 DIAGNOSIS — G35 Multiple sclerosis: Secondary | ICD-10-CM | POA: Diagnosis not present

## 2017-03-05 DIAGNOSIS — S62102D Fracture of unspecified carpal bone, left wrist, subsequent encounter for fracture with routine healing: Secondary | ICD-10-CM

## 2017-03-05 DIAGNOSIS — E669 Obesity, unspecified: Secondary | ICD-10-CM | POA: Diagnosis present

## 2017-03-05 DIAGNOSIS — R200 Anesthesia of skin: Secondary | ICD-10-CM | POA: Diagnosis not present

## 2017-03-05 LAB — CBC WITH DIFFERENTIAL/PLATELET
BASOS ABS: 0.1 10*3/uL (ref 0–0.1)
Basophils Relative: 1 %
EOS PCT: 1 %
Eosinophils Absolute: 0.1 10*3/uL (ref 0–0.7)
HCT: 38.6 % (ref 35.0–47.0)
HEMOGLOBIN: 13 g/dL (ref 12.0–16.0)
LYMPHS ABS: 2 10*3/uL (ref 1.0–3.6)
LYMPHS PCT: 23 %
MCH: 26.7 pg (ref 26.0–34.0)
MCHC: 33.8 g/dL (ref 32.0–36.0)
MCV: 79.1 fL — AB (ref 80.0–100.0)
Monocytes Absolute: 0.8 10*3/uL (ref 0.2–0.9)
Monocytes Relative: 10 %
NEUTROS ABS: 5.7 10*3/uL (ref 1.4–6.5)
NEUTROS PCT: 65 %
PLATELETS: 232 10*3/uL (ref 150–440)
RBC: 4.88 MIL/uL (ref 3.80–5.20)
RDW: 12.9 % (ref 11.5–14.5)
WBC: 8.8 10*3/uL (ref 3.6–11.0)

## 2017-03-05 LAB — BASIC METABOLIC PANEL
ANION GAP: 8 (ref 5–15)
BUN: 26 mg/dL — ABNORMAL HIGH (ref 6–20)
CHLORIDE: 105 mmol/L (ref 101–111)
CO2: 25 mmol/L (ref 22–32)
CREATININE: 0.72 mg/dL (ref 0.44–1.00)
Calcium: 9.5 mg/dL (ref 8.9–10.3)
GFR calc non Af Amer: >60 mL/min (ref >60)
Glucose, Bld: 112 mg/dL — ABNORMAL HIGH (ref 65–99)
Potassium: 4.4 mmol/L (ref 3.5–5.1)
Sodium: 138 mmol/L (ref 135–145)

## 2017-03-05 LAB — GLUCOSE, CAPILLARY: GLUCOSE-CAPILLARY: 137 mg/dL — AB (ref 65–99)

## 2017-03-05 MED ORDER — ONDANSETRON HCL 4 MG PO TABS: 4.0000 mg | ORAL_TABLET | Freq: Four times a day (QID) | ORAL | Status: DC | PRN

## 2017-03-05 MED ORDER — METOCLOPRAMIDE HCL 5 MG PO TABS
10.0000 mg | ORAL_TABLET | Freq: Three times a day (TID) | ORAL | Status: DC | PRN
  Filled 2017-03-05: qty 1

## 2017-03-05 MED ORDER — SODIUM CHLORIDE 0.9 % IV SOLN
1000.0000 mg | Freq: Once | INTRAVENOUS | Status: AC
  Administered 2017-03-05: 1000 mg via INTRAVENOUS
  Filled 2017-03-05: qty 8

## 2017-03-05 MED ORDER — ACETAMINOPHEN 325 MG PO TABS
650.0000 mg | ORAL_TABLET | Freq: Four times a day (QID) | ORAL | Status: DC | PRN
  Administered 2017-03-11: 650 mg via ORAL
  Filled 2017-03-05: qty 2

## 2017-03-05 MED ORDER — DOCUSATE SODIUM 100 MG PO CAPS: 100.0000 mg | ORAL_CAPSULE | Freq: Every day | ORAL | Status: DC | PRN

## 2017-03-05 MED ORDER — ADULT MULTIVITAMIN W/MINERALS CH
1.0000 | ORAL_TABLET | Freq: Every day | ORAL | Status: DC
Start: 2017-03-06 — End: 2017-03-14
  Administered 2017-03-06 – 2017-03-14 (×9): 1 via ORAL
  Filled 2017-03-05 (×9): qty 1

## 2017-03-05 MED ORDER — TIZANIDINE HCL 4 MG PO TABS
4.0000 mg | ORAL_TABLET | Freq: Four times a day (QID) | ORAL | Status: DC | PRN
  Filled 2017-03-05: qty 1

## 2017-03-05 MED ORDER — BACLOFEN 10 MG PO TABS
20.0000 mg | ORAL_TABLET | Freq: Once | ORAL | Status: AC
  Administered 2017-03-05: 20 mg via ORAL
  Filled 2017-03-05: qty 2

## 2017-03-05 MED ORDER — TEMAZEPAM 7.5 MG PO CAPS
7.5000 mg | ORAL_CAPSULE | Freq: Every evening | ORAL | Status: DC | PRN
Start: 2017-03-05 — End: 2017-03-14
  Administered 2017-03-05 – 2017-03-07 (×2): 7.5 mg via ORAL
  Filled 2017-03-05 (×3): qty 1

## 2017-03-05 MED ORDER — CALCIUM-VITAMIN D 500-200 MG-UNIT PO TABS
1.0000 | ORAL_TABLET | Freq: Every day | ORAL | Status: DC
Start: 2017-03-06 — End: 2017-03-14
  Administered 2017-03-06 – 2017-03-14 (×9): 1 via ORAL
  Filled 2017-03-05 (×9): qty 1

## 2017-03-05 MED ORDER — OXYBUTYNIN CHLORIDE 5 MG PO TABS
5.0000 mg | ORAL_TABLET | Freq: Two times a day (BID) | ORAL | Status: DC
  Administered 2017-03-05 – 2017-03-14 (×18): 5 mg via ORAL
  Filled 2017-03-05 (×18): qty 1

## 2017-03-05 MED ORDER — INSULIN ASPART 100 UNIT/ML ~~LOC~~ SOLN: 0.0000 [IU] | Freq: Every day | SUBCUTANEOUS | Status: DC

## 2017-03-05 MED ORDER — SODIUM CHLORIDE 0.9 % IV BOLUS (SEPSIS)
1000.0000 mL | Freq: Once | INTRAVENOUS | Status: AC
  Administered 2017-03-05: 1000 mL via INTRAVENOUS

## 2017-03-05 MED ORDER — INSULIN ASPART 100 UNIT/ML ~~LOC~~ SOLN
0.0000 [IU] | Freq: Three times a day (TID) | SUBCUTANEOUS | Status: DC
  Filled 2017-03-05: qty 1

## 2017-03-05 MED ORDER — SODIUM CHLORIDE 0.9 % IV SOLN
1000.0000 mg | Freq: Every day | INTRAVENOUS | Status: DC
  Administered 2017-03-06 – 2017-03-07 (×2): 1000 mg via INTRAVENOUS
  Filled 2017-03-05 (×3): qty 8

## 2017-03-05 MED ORDER — MELOXICAM 7.5 MG PO TABS
15.0000 mg | ORAL_TABLET | Freq: Two times a day (BID) | ORAL | Status: DC | PRN
  Filled 2017-03-05: qty 1

## 2017-03-05 MED ORDER — BACLOFEN 10 MG PO TABS
20.0000 mg | ORAL_TABLET | Freq: Three times a day (TID) | ORAL | Status: DC | PRN
  Administered 2017-03-05 – 2017-03-06 (×2): 20 mg via ORAL
  Filled 2017-03-05: qty 1
  Filled 2017-03-05 (×2): qty 2

## 2017-03-05 MED ORDER — ONDANSETRON HCL 4 MG/2ML IJ SOLN: 4.0000 mg | Freq: Four times a day (QID) | INTRAMUSCULAR | Status: DC | PRN

## 2017-03-05 MED ORDER — LOSARTAN POTASSIUM 50 MG PO TABS
100.0000 mg | ORAL_TABLET | Freq: Every day | ORAL | Status: DC
  Administered 2017-03-06 – 2017-03-14 (×9): 100 mg via ORAL
  Filled 2017-03-05 (×9): qty 2

## 2017-03-05 MED ORDER — ACETAMINOPHEN 650 MG RE SUPP: 650.0000 mg | Freq: Four times a day (QID) | RECTAL | Status: DC | PRN

## 2017-03-05 MED ORDER — POLYETHYLENE GLYCOL 3350 17 G PO PACK: 17.0000 g | PACK | ORAL | Status: DC | PRN

## 2017-03-05 MED ORDER — ENOXAPARIN SODIUM 40 MG/0.4ML ~~LOC~~ SOLN
40.0000 mg | SUBCUTANEOUS | Status: DC
  Administered 2017-03-05 – 2017-03-13 (×9): 40 mg via SUBCUTANEOUS
  Filled 2017-03-05 (×9): qty 0.4

## 2017-03-05 MED ORDER — HYDROCODONE-ACETAMINOPHEN 5-325 MG PO TABS: 1.0000 | ORAL_TABLET | Freq: Four times a day (QID) | ORAL | Status: DC | PRN

## 2017-03-05 NOTE — ED Provider Notes (Signed)
[facility] Emergency Department Provider Note ____________________________________________   I have reviewed the triage vital signs and the triage nursing note.  HISTORY  Chief Complaint Other (MS flare up)   Historian Patient  HPI Lisa Williams is a 56 y.o. female with a history of multiple sclerosis, as well as relatively recent left wrist fracture for which she had surgical titanium plates placed and has been in rehab since September.  Patient states that she has been relatively stressed out at [facility] rehab and this morning was given the go ahead for weightbearing and when she went to stand up she realized her both legs were weak and that her legs were numb and she was having some urinary incontinence which is similar to prior not multiple sclerosis flare.  Patient has followed with Dr. Thad Ranger in the hospital previously, neurology, and states that she typically has 3 days of 1 g Solu-Medrol with resultant improvement.  She states she also received an infusion for MS.  She is also complaining of leg pain which happens with her MS flares as well.   Past Medical History:  Diagnosis Date  . Abdominal pain, right upper quadrant   . Back pain   . Calculus of kidney 12/09/2013  . Chronic back pain    unspecified  . Chronic left shoulder pain 07/19/2015  . Functional disorder of bladder    other  . Galactorrhea 11/26/2014   Chronic   . Hereditary and idiopathic neuropathy 08/19/2013  . HPV test positive   . Hypercholesteremia 08/19/2013  . Incomplete bladder emptying   . Microscopic hematuria   . MS (multiple sclerosis) (HCC)   . Muscle spasticity 05/21/2014  . Nonspecific findings on examination of urine    other  . Osteopenia   . Status post laparoscopic supracervical hysterectomy 11/26/2014  . Tobacco user 11/26/2014  . Wrist fracture     Patient Active Problem List   Diagnosis Date Noted  . Wrist fracture 01/16/2017  . Health care  maintenance 01/24/2016  . Blood pressure elevated without history of HTN 10/25/2015  . Multiple sclerosis (HCC) 10/02/2015  . Chronic left shoulder pain 07/19/2015  . Multiple sclerosis exacerbation (HCC) 07/14/2015  . MS (multiple sclerosis) (HCC) 11/26/2014  . Increased body mass index 11/26/2014  . HPV test positive 11/26/2014  . Status post laparoscopic supracervical hysterectomy 11/26/2014  . Galactorrhea 11/26/2014  . Tobacco user 11/26/2014  . Back ache 05/21/2014  . Adiposity 05/21/2014  . Disordered sleep 05/21/2014  . Muscle spasticity 05/21/2014  . Calculus of kidney 12/09/2013  . Renal colic 12/09/2013  . Hypercholesteremia 08/19/2013  . Hereditary and idiopathic neuropathy 08/19/2013  . Hypercholesterolemia without hypertriglyceridemia 08/19/2013  . Bladder infection, chronic 07/25/2012  . Disorder of bladder function 07/25/2012  . Incomplete bladder emptying 07/25/2012  . Microscopic hematuria 07/25/2012    Past Surgical History:  Procedure Laterality Date  . bilateral tubal ligation  1996  . BREAST CYST EXCISION Left 2002  . KNEE SURGERY     right  . LAPAROSCOPIC SUPRACERVICAL HYSTERECTOMY  08/05/2013  . ORIF WRIST FRACTURE Left 01/17/2017   Procedure: OPEN REDUCTION INTERNAL FIXATION (ORIF) WRIST FRACTURE;  Surgeon: Lyndle Herrlich, MD;  Location: [facility] ORS;  Service: Orthopedics;  Laterality: Left;  . TUBAL LIGATION Bilateral   . VAGINAL HYSTERECTOMY  03/2006    Prior to Admission medications   Medication Sig Start Date End Date Taking? Authorizing Provider  acetaminophen (TYLENOL) 325 MG tablet Take 650 mg by mouth every 8 (  eight) hours as needed. Do not exceed 3000 mg in 24 hrs    [provider]  baclofen (LIORESAL) 20 MG tablet Take 20 mg by mouth every 8 (eight) hours as needed for muscle spasms.     [provider]  Calcium Carb-Cholecalciferol (CALCIUM 600+D) 600-800 MG-UNIT TABS Take 1 tablet by mouth daily.    [provider]  docusate sodium (COLACE) 100 MG capsule Take 100 mg by mouth daily as needed for mild constipation.    [provider]  HYDROcodone-acetaminophen (NORCO) 5-325 MG tablet Take 1 tablet by mouth every 6 (six) hours as needed for moderate pain. 01/22/17   Lorenso Quarry, NP  losartan (COZAAR) 100 MG tablet Take 100 mg by mouth daily.  11/27/16 11/27/17  [provider]  meloxicam (MOBIC) 15 MG tablet Take 1 tablet (15 mg total) by mouth 2 (two) times daily as needed for pain. 03/27/16   Milagros Loll, MD  metoCLOPramide (REGLAN) 10 MG tablet Take 10 mg by mouth every 8 (eight) hours as needed for nausea. If Zofran not effective    [provider]  Multiple Vitamin (MULTIVITAMIN) tablet Take 1 tablet by mouth daily.    [provider]  oxybutynin (DITROPAN) 5 MG tablet Take 1 tablet (5 mg total) by mouth 2 (two) times daily. 03/27/16   Milagros Loll, MD  polyethylene glycol (MIRALAX / GLYCOLAX) packet Take 17 g by mouth See admin instructions. Once every other day prn    [provider]  RITUXIMAB IV Pt uses every 6 months due October 2018 09/06/16   [provider]  tiZANidine (ZANAFLEX) 4 MG tablet Take 4 mg by mouth every 6 (six) hours as needed for muscle spasms.  06/21/16   [provider]    Allergies  Allergen Reactions  . Amlodipine Other (See Comments)    edema  . Povidone Iodine Rash    Family History  Problem Relation Age of Onset  . Sickle cell trait Sister   . Supraventricular tachycardia Sister   . Diabetes Maternal Grandmother   . Liver cancer Maternal Grandmother   . Diabetes Maternal Aunt   . Breast cancer Maternal Aunt        great MAT  . Ovarian cancer Paternal Grandmother   . Nephrolithiasis Father   . Hypertension Father   . Prostate cancer Father   . Hypertension Sister   . Osteoarthritis Mother   . Hypertension Mother   . GU problems Neg Hx   . Kidney disease Neg Hx   . Urolithiasis Neg Hx      Social History Social History  Substance Use Topics  . Smoking status: Former Smoker    Packs/day: 0.50    Types: Cigarettes  . Smokeless tobacco: Never Used  . Alcohol use No    Review of Systems  Constitutional: Negative for fever. Eyes: Negative for visual changes. ENT: Negative for sore throat. Cardiovascular: Negative for chest pain. Respiratory: Negative for shortness of breath. Gastrointestinal: Negative for abdominal pain, vomiting and diarrhea. Genitourinary: Negative for dysuria. Musculoskeletal: Negative for back pain. Skin: Negative for rash. Neurological: Negative for headache.  ____________________________________________   PHYSICAL EXAM:  VITAL SIGNS: ED Triage Vitals  Enc Vitals Group     BP 03/05/17 1354 (!) 166/56     Pulse Rate 03/05/17 1354 88     Resp 03/05/17 1354 18     Temp 03/05/17 1354 97.8 F (36.6 C)     Temp Source 03/05/17 1354 Oral  SpO2 03/05/17 1354 100 %     Weight 03/05/17 1355 244 lb (110.7 kg)     Height 03/05/17 1355 5\' 11"  (1.803 m)     Head Circumference --      Peak Flow --      Pain Score --      Pain Loc --      Pain Edu? --      Excl. in GC? --      Constitutional: Alert and oriented. Well appearing and in no distress. HEENT   Head: Normocephalic and atraumatic.      Eyes: Conjunctivae are normal. Pupils equal and round.       Ears:         Nose: No congestion/rhinnorhea.   Mouth/Throat: Mucous membranes are moist.   Neck: No stridor. Cardiovascular/Chest: Normal rate, regular rhythm.  No murmurs, rubs, or gallops. Respiratory: Normal respiratory effort without tachypnea nor retractions. Breath sounds are clear and equal bilaterally. No wheezes/rales/rhonchi. Gastrointestinal: Soft. No distention, no guarding, no rebound. Nontender.    Genitourinary/rectal:Deferred Musculoskeletal: Nontender with normal range of motion in all extremities. No joint effusions.  No lower extremity tenderness.  No  edema. Neurologic: No facial droop.  No slurred speech.  Able to move both legs, but weak.  Reports paresthesia to both legs.  Upper extremities normal. Skin:  Skin is warm, dry and intact. No rash noted. Psychiatric: Mood and affect are normal. Speech and behavior are normal. Patient exhibits appropriate insight and judgment.   ____________________________________________  LABS (pertinent positives/negatives) I, Governor Rooksebecca Joandy Burget, MD the attending physician have reviewed the labs noted below.  Labs Reviewed  BASIC METABOLIC PANEL  CBC WITH DIFFERENTIAL/PLATELET  URINALYSIS, COMPLETE (UACMP) WITH MICROSCOPIC    ____________________________________________    EKG I, Governor Rooksebecca Gertha Lichtenberg, MD, the attending physician have personally viewed and interpreted all ECGs.  None ____________________________________________  RADIOLOGY All Xrays were viewed by me.  Imaging interpreted by Radiologist, and I, Governor Rooksebecca Odesser Tourangeau, MD the attending physician have reviewed the radiologist interpretation noted below.  None __________________________________________  PROCEDURES  Procedure(s) performed: None  Critical Care performed: None  ____________________________________________  No current facility-administered medications on file prior to encounter.    Current Outpatient Prescriptions on File Prior to Encounter  Medication Sig Dispense Refill  . acetaminophen (TYLENOL) 325 MG tablet Take 650 mg by mouth every 8 (eight) hours as needed. Do not exceed 3000 mg in 24 hrs    . baclofen (LIORESAL) 20 MG tablet Take 20 mg by mouth every 8 (eight) hours as needed for muscle spasms.     . Calcium Carb-Cholecalciferol (CALCIUM 600+D) 600-800 MG-UNIT TABS Take 1 tablet by mouth daily.    Marland Kitchen. docusate sodium (COLACE) 100 MG capsule Take 100 mg by mouth daily as needed for mild constipation.    Marland Kitchen. HYDROcodone-acetaminophen (NORCO) 5-325 MG tablet Take 1 tablet by mouth every 6 (six) hours as needed for moderate  pain. 120 tablet 0  . losartan (COZAAR) 100 MG tablet Take 100 mg by mouth daily.     . meloxicam (MOBIC) 15 MG tablet Take 1 tablet (15 mg total) by mouth 2 (two) times daily as needed for pain.  2  . metoCLOPramide (REGLAN) 10 MG tablet Take 10 mg by mouth every 8 (eight) hours as needed for nausea. If Zofran not effective    . Multiple Vitamin (MULTIVITAMIN) tablet Take 1 tablet by mouth daily.    Marland Kitchen. oxybutynin (DITROPAN) 5 MG tablet Take 1 tablet (5 mg total) by mouth  2 (two) times daily.    . polyethylene glycol (MIRALAX / GLYCOLAX) packet Take 17 g by mouth See admin instructions. Once every other day prn    . RITUXIMAB IV Pt uses every 6 months due October 2018    . tiZANidine (ZANAFLEX) 4 MG tablet Take 4 mg by mouth every 6 (six) hours as needed for muscle spasms.       ____________________________________________  ED COURSE / ASSESSMENT AND PLAN  Pertinent labs & imaging results that were available during my care of the patient were reviewed by me and considered in my medical decision making (see chart for details).    This patient has a history of multiple sclerosis and reports recurrent symptoms consistent with her prior multiple sclerosis flare which includes lower extremity weakness, pain, and numbness/paresthesia as well as some intermittent urinary incontinence symptoms.  Does not report any new trauma.  No fevers.  She cannot have an MRI because she states she has metal in her wrist now.  I did confirm by phone with neurologist, Dr. Thad Ranger to go ahead and initiate 1 g Solu-Medrol and patient may be admitted with continued treatment for multiple sclerosis flare.  Labs pending at time of hospitalist consultation for admission.  CONSULTATIONS:   Hospitalist for admission.   Patient / Family / Caregiver informed of clinical course, medical decision-making process, and agree with plan.  ___________________________________________   FINAL CLINICAL IMPRESSION(S) / ED  DIAGNOSES   Final diagnoses:  Multiple sclerosis exacerbation (HCC)              Note: This dictation was prepared with Dragon dictation. Any transcriptional errors that result from this process are unintentional    Governor Rooks, MD 03/05/17 1500

## 2017-03-05 NOTE — ED Triage Notes (Signed)
Pt arrived via [facility] from [place].  Pt at [facility] for rehab due to recently broken L wrist (01/16/17).  Pt states when she has a lot of stress she tends to have an MS flare up.  Pt reports upon waking this morning she was unable to bear weight on her bilateral legs.  Pt states that she has also had urinary frequency over the past 2 days, which is also a typical sign of her flare ups.  Pt is A&Ox4 and in NAD.

## 2017-03-05 NOTE — H&P (Signed)
[facility] [place] [facility]   PATIENT NAME: Lisa Williams    MR#:  [id number]  DATE OF BIRTH:  Jul 19, 1960  DATE OF ADMISSION:  03/05/2017  PRIMARY CARE PHYSICIAN: Lauro RegulusAnderson, Marshall W, MD   REQUESTING/REFERRING PHYSICIAN: Dr Herma CarsonMathew Stafford  CHIEF COMPLAINT:   Chief Complaint  Patient presents with  . Other    MS flare up    HISTORY OF PRESENT ILLNESS:  Lisa Williams  is a 56 y.o. female with a known history of relapsing remitting multiple sclerosis.  She had a recent left wrist fracture that required repair and was over at rehab.  The orthopedic surgeon did not clear her to do weightbearing with using her arm on the walker.  When she was finally cleared to do her weightbearing she could not move her legs.  She had decreased feeling in her legs.  She knew that she was in an MS flare and came to the ER.  Otherwise she feels okay.  She has had MS for 23 years.  She has done well without any exacerbation for a while since she has been taking Rituxan.  Hospitalist services were contacted for admission  PAST MEDICAL HISTORY:   Past Medical History:  Diagnosis Date  . Abdominal pain, right upper quadrant   . Back pain   . Calculus of kidney 12/09/2013  . Chronic back pain    unspecified  . Chronic left shoulder pain 07/19/2015  . Functional disorder of bladder    other  . Galactorrhea 11/26/2014   Chronic   . Hereditary and idiopathic neuropathy 08/19/2013  . HPV test positive   . Hypercholesteremia 08/19/2013  . Incomplete bladder emptying   . Microscopic hematuria   . MS (multiple sclerosis) (HCC)   . Muscle spasticity 05/21/2014  . Nonspecific findings on examination of urine    other  . Osteopenia   . Status post laparoscopic supracervical hysterectomy 11/26/2014  . Tobacco user 11/26/2014  . Wrist fracture     PAST SURGICAL HISTORY:   Past Surgical History:  Procedure Laterality Date  . bilateral tubal ligation  1996  .  BREAST CYST EXCISION Left 2002  . KNEE SURGERY     right  . LAPAROSCOPIC SUPRACERVICAL HYSTERECTOMY  08/05/2013  . ORIF WRIST FRACTURE Left 01/17/2017   Procedure: OPEN REDUCTION INTERNAL FIXATION (ORIF) WRIST FRACTURE;  Surgeon: Lyndle HerrlichBowers, James R, MD;  Location: [facility] ORS;  Service: Orthopedics;  Laterality: Left;  . TUBAL LIGATION Bilateral   . VAGINAL HYSTERECTOMY  03/2006    SOCIAL HISTORY:   Social History  Substance Use Topics  . Smoking status: Former Smoker    Packs/day: 0.50    Types: Cigarettes  . Smokeless tobacco: Never Used  . Alcohol use No    FAMILY HISTORY:   Family History  Problem Relation Age of Onset  . Sickle cell trait Sister   . Supraventricular tachycardia Sister   . Diabetes Maternal Grandmother   . Liver cancer Maternal Grandmother   . Diabetes Maternal Aunt   . Breast cancer Maternal Aunt        great MAT  . Ovarian cancer Paternal Grandmother   . Nephrolithiasis Father   . Hypertension Father   . Prostate cancer Father   . Kidney disease Father   . Heart failure Father   . Hypertension Sister   . Osteoarthritis Mother   . Hypertension Mother   . Hyperlipidemia Mother   . GU problems Neg Hx   . Urolithiasis Neg  Hx     DRUG ALLERGIES:   Allergies  Allergen Reactions  . Amlodipine Other (See Comments)    edema  . Povidone Iodine Rash    REVIEW OF SYSTEMS:  CONSTITUTIONAL: No fever, chills or sweats.  Positive for weakness bilateral lower extremities EYES: No blurred or double vision.  Wears glasses EARS, NOSE, AND THROAT: No tinnitus or ear pain. No sore throat RESPIRATORY: No cough, shortness of breath, wheezing or hemoptysis.  CARDIOVASCULAR: No chest pain, orthopnea, edema.  GASTROINTESTINAL: No nausea, vomiting, diarrhea or abdominal pain. No blood in bowel movements GENITOURINARY: No dysuria, hematuria.  ENDOCRINE: No polyuria, nocturia,  HEMATOLOGY: No anemia, easy bruising or bleeding SKIN: No rash or  lesion. MUSCULOSKELETAL: No joint pain or arthritis.   NEUROLOGIC: No tingling, numbness, weakness.  PSYCHIATRY: No anxiety or depression.   MEDICATIONS AT HOME:   Prior to Admission medications   Medication Sig Start Date End Date Taking? Authorizing Provider  acetaminophen (TYLENOL) 325 MG tablet Take 650 mg by mouth every 8 (eight) hours as needed. Do not exceed 3000 mg in 24 hrs   Yes [provider]  baclofen (LIORESAL) 20 MG tablet Take 20 mg by mouth every 8 (eight) hours as needed for muscle spasms.    Yes [provider]  Calcium Carb-Cholecalciferol (CALCIUM 600+D) 600-800 MG-UNIT TABS Take 1 tablet by mouth daily.   Yes [provider]  docusate sodium (COLACE) 100 MG capsule Take 100 mg by mouth daily as needed for mild constipation.   Yes [provider]  HYDROcodone-acetaminophen (NORCO) 5-325 MG tablet Take 1 tablet by mouth every 6 (six) hours as needed for moderate pain. 01/22/17  Yes Lorenso Quarry, NP  losartan (COZAAR) 100 MG tablet Take 100 mg by mouth daily.  11/27/16 11/27/17 Yes [provider]  meloxicam (MOBIC) 15 MG tablet Take 1 tablet (15 mg total) by mouth 2 (two) times daily as needed for pain. 03/27/16  Yes Sudini, Wardell Heath, MD  metoCLOPramide (REGLAN) 10 MG tablet Take 10 mg by mouth every 8 (eight) hours as needed for nausea. If Zofran not effective   Yes [provider]  Multiple Vitamin (MULTIVITAMIN) tablet Take 1 tablet by mouth daily.   Yes [provider]  oxybutynin (DITROPAN) 5 MG tablet Take 1 tablet (5 mg total) by mouth 2 (two) times daily. 03/27/16  Yes Sudini, Wardell Heath, MD  polyethylene glycol (MIRALAX / GLYCOLAX) packet Take 17 g by mouth every other day as needed for mild constipation.    Yes [provider]  tiZANidine (ZANAFLEX) 4 MG tablet Take 4 mg by mouth every 6 (six) hours as needed for muscle spasms.  06/21/16  Yes [provider]  RITUXIMAB IV Pt uses every 6 months  due October 2018 09/06/16   [provider]      VITAL SIGNS:  Blood pressure (!) 144/59, pulse 79, temperature 97.8 F (36.6 C), temperature source Oral, resp. rate 18, height 5\' 11"  (1.803 m), weight 110.7 kg (244 lb), SpO2 96 %.  PHYSICAL EXAMINATION:  GENERAL:  56 y.o.-year-old patient lying in the bed with no acute distress.  EYES: Pupils equal, round, reactive to light and accommodation. No scleral icterus. Extraocular muscles intact.  HEENT: Head atraumatic, normocephalic. Oropharynx and nasopharynx clear.  NECK:  Supple, no jugular venous distention. No thyroid enlargement, no tenderness.  LUNGS: Normal breath sounds bilaterally, no wheezing, rales,rhonchi or crepitation. No use of accessory muscles of respiration.  CARDIOVASCULAR: S1, S2 normal. No murmurs, rubs, or  gallops.  ABDOMEN: Soft, nontender, nondistended. Bowel sounds present. No organomegaly or mass.  EXTREMITIES: No pedal edema, cyanosis, or clubbing.  NEUROLOGIC: Cranial nerves II through XII are intact. Muscle strength 5/5 upper extremities.  Unable to straight leg raise with the right leg.  Just barely able to lift the left leg up off the bed.  Sensation decreased to light touch bilateral lower extremities. PSYCHIATRIC: The patient is alert and oriented x 3.  SKIN: No rash, lesion, or ulcer.   LABORATORY PANEL:   CBC  Recent Labs Lab 03/05/17 1558  WBC 8.8  HGB 13.0  HCT 38.6  PLT 232   ------------------------------------------------------------------------------------------------------------------  Chemistries   Recent Labs Lab 03/05/17 1558  NA 138  K 4.4  CL 105  CO2 25  GLUCOSE 112*  BUN 26*  CREATININE 0.72  CALCIUM 9.5   ------------------------------------------------------------------------------------------------------------------    IMPRESSION AND PLAN:   1.  Multiple sclerosis exacerbation.  1 g IV Solu-Medrol daily for 3 days.  Neurology consultation.  Physical therapy  and occupational therapy evaluations.  The patient did come from rehab. 2.  Essential hypertension on losartan 3.  Recent wrist fracture.  Start OT. 4.  Constipation continue as needed medications   All the records are reviewed and case discussed with ED provider. Management plans discussed with the patient, family and they are in agreement.  CODE STATUS: Full code  TOTAL TIME TAKING CARE OF THIS PATIENT: 50 minutes.    Alford Highland M.D on 03/05/2017 at 7:10 PM  Between 7am to 6pm - Pager - [phone]  After 6pm call admission pager [phone]  [facility]  [phone]  CC: Primary care physician; Lauro Regulus, MD

## 2017-03-06 ENCOUNTER — Other Ambulatory Visit: Payer: Self-pay

## 2017-03-06 DIAGNOSIS — G35 Multiple sclerosis: Principal | ICD-10-CM

## 2017-03-06 LAB — BASIC METABOLIC PANEL
ANION GAP: 6 (ref 5–15)
BUN: 23 mg/dL — AB (ref 6–20)
CHLORIDE: 107 mmol/L (ref 101–111)
CO2: 25 mmol/L (ref 22–32)
Calcium: 9 mg/dL (ref 8.9–10.3)
Creatinine, Ser: 0.53 mg/dL (ref 0.44–1.00)
Glucose, Bld: 134 mg/dL — ABNORMAL HIGH (ref 65–99)
POTASSIUM: 3.9 mmol/L (ref 3.5–5.1)
SODIUM: 138 mmol/L (ref 135–145)

## 2017-03-06 LAB — GLUCOSE, CAPILLARY
GLUCOSE-CAPILLARY: 131 mg/dL — AB (ref 65–99)
GLUCOSE-CAPILLARY: 121 mg/dL — AB (ref 65–99)
Glucose-Capillary: 115 mg/dL — ABNORMAL HIGH (ref 65–99)

## 2017-03-06 LAB — CBC
HEMATOCRIT: 36.9 % (ref 35.0–47.0)
Hemoglobin: 12.5 g/dL (ref 12.0–16.0)
MCH: 26.9 pg (ref 26.0–34.0)
MCHC: 33.8 g/dL (ref 32.0–36.0)
MCV: 79.6 fL — AB (ref 80.0–100.0)
Platelets: 213 10*3/uL (ref 150–440)
RBC: 4.64 MIL/uL (ref 3.80–5.20)
RDW: 13.4 % (ref 11.5–14.5)
WBC: 7.6 10*3/uL (ref 3.6–11.0)

## 2017-03-06 MED ORDER — BACLOFEN 10 MG PO TABS
20.0000 mg | ORAL_TABLET | Freq: Three times a day (TID) | ORAL | Status: DC
  Administered 2017-03-06 – 2017-03-14 (×25): 20 mg via ORAL
  Filled 2017-03-06 (×26): qty 2

## 2017-03-06 NOTE — Consult Note (Addendum)
Reason for Consult:MS exacerbation Referring Physician: Allena Katz  CC: Lower extremity weakness  HPI: Lisa Williams is an 56 y.o. female with a history of MS and recent fall with wrist fracture who presents with LE weakness.  Patient reports that she is usually able to help with transfers and walk with walker.  Yesterday AM was unable to help with transfer and was unable to ambulate, even with help, using walker.  Also reports some urinary incontinence.    Past Medical History:  Diagnosis Date  . Abdominal pain, right upper quadrant   . Back pain   . Calculus of kidney 12/09/2013  . Chronic back pain    unspecified  . Chronic left shoulder pain 07/19/2015  . Functional disorder of bladder    other  . Galactorrhea 11/26/2014   Chronic   . Hereditary and idiopathic neuropathy 08/19/2013  . HPV test positive   . Hypercholesteremia 08/19/2013  . Incomplete bladder emptying   . Microscopic hematuria   . MS (multiple sclerosis) (HCC)   . Muscle spasticity 05/21/2014  . Nonspecific findings on examination of urine    other  . Osteopenia   . Status post laparoscopic supracervical hysterectomy 11/26/2014  . Tobacco user 11/26/2014  . Wrist fracture     Past Surgical History:  Procedure Laterality Date  . bilateral tubal ligation  1996  . BREAST CYST EXCISION Left 2002  . KNEE SURGERY     right  . LAPAROSCOPIC SUPRACERVICAL HYSTERECTOMY  08/05/2013  . ORIF WRIST FRACTURE Left 01/17/2017   Procedure: OPEN REDUCTION INTERNAL FIXATION (ORIF) WRIST FRACTURE;  Surgeon: Lyndle Herrlich, MD;  Location: [facility] ORS;  Service: Orthopedics;  Laterality: Left;  . TUBAL LIGATION Bilateral   . VAGINAL HYSTERECTOMY  03/2006    Family History  Problem Relation Age of Onset  . Sickle cell trait Sister   . Supraventricular tachycardia Sister   . Diabetes Maternal Grandmother   . Liver cancer Maternal Grandmother   . Diabetes Maternal Aunt   . Breast cancer Maternal Aunt        great MAT  .  Ovarian cancer Paternal Grandmother   . Nephrolithiasis Father   . Hypertension Father   . Prostate cancer Father   . Kidney disease Father   . Heart failure Father   . Hypertension Sister   . Osteoarthritis Mother   . Hypertension Mother   . Hyperlipidemia Mother   . GU problems Neg Hx   . Urolithiasis Neg Hx     Social History:  reports that she has quit smoking. Her smoking use included Cigarettes. She smoked 0.50 packs per day. She has never used smokeless tobacco. She reports that she does not drink alcohol or use drugs.  Allergies  Allergen Reactions  . Amlodipine Other (See Comments)    edema  . Povidone Iodine Rash    Medications:  I have reviewed the patient's current medications. Prior to Admission:  Prescriptions Prior to Admission  Medication Sig Dispense Refill Last Dose  . acetaminophen (TYLENOL) 325 MG tablet Take 650 mg by mouth every 8 (eight) hours as needed. Do not exceed 3000 mg in 24 hrs   02/08/2017 at 1400  . baclofen (LIORESAL) 20 MG tablet Take 20 mg by mouth every 8 (eight) hours as needed for muscle spasms.    03/05/2017 at 0830  . Calcium Carb-Cholecalciferol (CALCIUM 600+D) 600-800 MG-UNIT TABS Take 1 tablet by mouth daily.   03/05/2017 at 0830  . docusate sodium (COLACE) 100 MG capsule  Take 100 mg by mouth daily as needed for mild constipation.   PRN at PRN  . HYDROcodone-acetaminophen (NORCO) 5-325 MG tablet Take 1 tablet by mouth every 6 (six) hours as needed for moderate pain. 120 tablet 0 02/27/2017 at UNKNOWN  . losartan (COZAAR) 100 MG tablet Take 100 mg by mouth daily.    03/05/2017 at 0830  . meloxicam (MOBIC) 15 MG tablet Take 1 tablet (15 mg total) by mouth 2 (two) times daily as needed for pain.  2 PRN at PRN  . metoCLOPramide (REGLAN) 10 MG tablet Take 10 mg by mouth every 8 (eight) hours as needed for nausea. If Zofran not effective   PRN at PRN  . Multiple Vitamin (MULTIVITAMIN) tablet Take 1 tablet by mouth daily.   03/05/2017 at 0830   . oxybutynin (DITROPAN) 5 MG tablet Take 1 tablet (5 mg total) by mouth 2 (two) times daily.   03/05/2017 at 0830  . polyethylene glycol (MIRALAX / GLYCOLAX) packet Take 17 g by mouth every other day as needed for mild constipation.    PRN at PRN  . tiZANidine (ZANAFLEX) 4 MG tablet Take 4 mg by mouth every 6 (six) hours as needed for muscle spasms.    PRN at PRN  . RITUXIMAB IV Pt uses every 6 months due October 2018   Not Taking at Unknown time   Scheduled: . baclofen  20 mg Oral TID  . calcium-vitamin D  1 tablet Oral Daily  . enoxaparin (LOVENOX) injection  40 mg Subcutaneous Q24H  . insulin aspart  0-5 Units Subcutaneous QHS  . insulin aspart  0-9 Units Subcutaneous TID WC  . losartan  100 mg Oral Daily  . multivitamin with minerals  1 tablet Oral Daily  . oxybutynin  5 mg Oral BID    ROS: History obtained from the patient  General ROS: negative for - chills, fatigue, fever, night sweats, weight gain or weight loss Psychological ROS: negative for - behavioral disorder, hallucinations, memory difficulties, mood swings or suicidal ideation Ophthalmic ROS: negative for - blurry vision, double vision, eye pain or loss of vision ENT ROS: negative for - epistaxis, nasal discharge, oral lesions, sore throat, tinnitus or vertigo Allergy and Immunology ROS: negative for - hives or itchy/watery eyes Hematological and Lymphatic ROS: negative for - bleeding problems, bruising or swollen lymph nodes Endocrine ROS: negative for - galactorrhea, hair pattern changes, polydipsia/polyuria or temperature intolerance Respiratory ROS: negative for - cough, hemoptysis, shortness of breath or wheezing Cardiovascular ROS: negative for - chest pain, dyspnea on exertion, edema or irregular heartbeat Gastrointestinal ROS: negative for - abdominal pain, diarrhea, hematemesis, nausea/vomiting or stool incontinence Genito-Urinary ROS: incontinence Musculoskeletal ROS: negative for - joint swelling or muscular  weakness Neurological ROS: as noted in HPI Dermatological ROS: negative for rash and skin lesion changes  Physical Examination: Blood pressure (!) 128/55, pulse 84, temperature 98.1 F (36.7 C), temperature source Oral, resp. rate 20, height 5\' 11"  (1.803 m), weight 110.7 kg (244 lb), SpO2 100 %.  HEENT-  Normocephalic, no lesions, without obvious abnormality.  Normal external eye and conjunctiva.  Normal TM's bilaterally.  Normal auditory canals and external ears. Normal external nose, mucus membranes and septum.  Normal pharynx. Cardiovascular- S1, S2 normal, pulses palpable throughout   Lungs- chest clear, no wheezing, rales, normal symmetric air entry Abdomen- soft, non-tender; bowel sounds normal; no masses,  no organomegaly Extremities- no edema Lymph-no adenopathy palpable Musculoskeletal-no joint tenderness, deformity or swelling Skin-warm and dry, no hyperpigmentation,  vitiligo, or suspicious lesions  Neurological Examination   Mental Status: Alert, oriented, thought content appropriate.  Speech fluent without evidence of aphasia.  Able to follow 3 step commands without difficulty. Cranial Nerves: II: Discs flat bilaterally; Visual fields grossly normal, pupils equal, round, reactive to light and accommodation III,IV, VI: ptosis not present, extra-ocular motions intact bilaterally V,VII: smile symmetric, facial light touch sensation normal bilaterally VIII: hearing normal bilaterally IX,X: gag reflex present XI: bilateral shoulder shrug XII: midline tongue extension Motor: Right : Upper extremity   5/5    Left:     Upper extremity   5/5  Lower extremity   3-/5     Lower extremity   3-/5 Tone and bulk:normal tone throughout; no atrophy noted Sensory: Pinprick and light touch decreased to the groin Deep Tendon Reflexes: 3+ and symmetric with RLE sustained clonus Plantars: Right: upgoing   Left: upgoing Normal finger-to-nose testing.  Unable to perform heel to shin due to  weakness.   Gait: not tested due to weakness    Laboratory Studies:   Basic Metabolic Panel:  Recent Labs Lab 03/05/17 1558 03/06/17 0504  NA 138 138  K 4.4 3.9  CL 105 107  CO2 25 25  GLUCOSE 112* 134*  BUN 26* 23*  CREATININE 0.72 0.53  CALCIUM 9.5 9.0    Liver Function Tests: No results for input(s): AST, ALT, ALKPHOS, BILITOT, PROT, ALBUMIN in the last 168 hours. No results for input(s): LIPASE, AMYLASE in the last 168 hours. No results for input(s): AMMONIA in the last 168 hours.  CBC:  Recent Labs Lab 03/05/17 1558 03/06/17 0504  WBC 8.8 7.6  NEUTROABS 5.7  --   HGB 13.0 12.5  HCT 38.6 36.9  MCV 79.1* 79.6*  PLT 232 213    Cardiac Enzymes: No results for input(s): CKTOTAL, CKMB, CKMBINDEX, TROPONINI in the last 168 hours.  BNP: Invalid input(s): POCBNP  CBG:  Recent Labs Lab 03/05/17 2041 03/06/17 0741 03/06/17 1200  GLUCAP 137* 131* 121*    Microbiology: Results for orders placed or performed during the hospital encounter of 01/16/17  Surgical PCR screen     Status: None   Collection Time: 01/16/17  3:00 PM  Result Value Ref Range Status   MRSA, PCR NEGATIVE NEGATIVE Final   Staphylococcus aureus NEGATIVE NEGATIVE Final    Comment: (NOTE) The Xpert SA Assay (FDA approved for NASAL specimens in patients 60 years of age and older), is one component of a comprehensive surveillance program. It is not intended to diagnose infection nor to guide or monitor treatment.     Coagulation Studies: No results for input(s): LABPROT, INR in the last 72 hours.  Urinalysis: No results for input(s): COLORURINE, LABSPEC, PHURINE, GLUCOSEU, HGBUR, BILIRUBINUR, KETONESUR, PROTEINUR, UROBILINOGEN, NITRITE, LEUKOCYTESUR in the last 168 hours.  Invalid input(s): APPERANCEUR  Lipid Panel:     Component Value Date/Time   CHOL 187 11/29/2016 0000   TRIG 64 11/29/2016 0000   HDL 55 11/29/2016 0000   CHOLHDL 3.4 11/29/2016 0000   LDLCALC 119 (H)  11/29/2016 0000    HgbA1C:  Lab Results  Component Value Date   HGBA1C 5.1 11/29/2016    Urine Drug Screen:  No results found for: LABOPIA, COCAINSCRNUR, LABBENZ, AMPHETMU, THCU, LABBARB  Alcohol Level: No results for input(s): ETH in the last 168 hours.   Imaging: No results found.   Assessment/Plan: 56 year old female with a history of MS presenting with LE weakness and urinary incontinence.  MS exacerbation.  Recommendations: 1.  Solumedrol 1g q 24hours for 3 days 2.  No steroid taper required at discharge.   3.  PT/OT evaluation.  Patient will require continued services at discharge.  4.  GI prophylaxis   Thana FarrLeslie Nyasha Rahilly, MD Neurology [phone] 03/06/2017, 12:26 PM

## 2017-03-06 NOTE — Clinical Social Work Note (Signed)
CSW received consult that patient was at [facility] for short term rehab.  CSW is continuing to follow patient's progress throughout discharge planning, formal assessment to follow.  Ervin Knack. Cinde Ebert, MSW, Theresia Majors [phone]  03/06/2017 5:05 PM

## 2017-03-06 NOTE — Evaluation (Signed)
Occupational Therapy Evaluation Patient Details Name: Delsa SaleJacqueline Renee Berrong MRN: [id number] DOB: 03/06/1961 Today's Date: 03/06/2017    History of Present Illness Pt is a 56 y.o. female with a known history of MS and recent fall with wrist fracture who presents with LE weakness. Per pt, MD has cleared L wrist now WBAT, brace for comfort/protection if desired when going out.   Clinical Impression   Pt seen for OT evaluation this date. Pt with recent admission s/p fall and L wrist fracture requiring surgical repair. Has been in STR since September admission where pt reports she was getting therapy up until 02/14/17 when insurance denied her further rehab services due to non-weight bearing status of her L wrist. Up to that point, she had been working on [employer] board transfers in rehab for mobility but BLE became increasingly weak to the point where once she was cleared to be WBAT with L wrist, she was unable to attempt standing with walker. Prior to rehab and September admission, pt was ambulating with a RW and modified independent with ADL tasks, taking a seated shower, driving, and active in both her church and volunteer work. Currently, pt is limited by L wrist/hand weakness and decreased AROM with some hesitancy to bear weight through the LUE, significant BLE weakness and impaired sensation, limiting her ability to perform all mobility and LB self care tasks. Pt max assist for BLE mgt for bed mobility, +2 for scooting up in bed, likely +2 for attempts at transfers as well. Pt/family educated in energy conservation strategies including deep breathing, activity pacing, stress management, work simplification, DME/AE, and home/routines modifications to maximize functional independence and minimize risk of falls and prevent/minimize MS flares. Handout provided. Pt/family verbalized understanding of verbal, visual, and written instruction/demonstration provided. Pt will benefit from skilled OT services to  address noted impairments and functional deficits in order to maximize return to PLOF and minimize risk of future falls/injury/readmission/increased caregiver burden. Recommend transition to STR following hospitalization to focus on LUE ROM and strengthening exercises and BLE strengthening for mobility and self care tasks, energy conservation and home/routine modifications/adaptations, activity pacing, and falls prevention.     Follow Up Recommendations  SNF    Equipment Recommendations  Tub/shower bench;Other (comment) (grab bars in shower)    Recommendations for Other Services       Precautions / Restrictions Precautions Precautions: Fall Restrictions Weight Bearing Restrictions: Yes LUE Weight Bearing: Weight bearing as tolerated Other Position/Activity Restrictions: LUE WBAT, no brace required; bilateral LE AFOs      Mobility Bed Mobility Overal bed mobility: Needs Assistance Bed Mobility: Supine to Sit;Sit to Supine     Supine to sit: Max assist;HOB elevated Sit to supine: Max assist   General bed mobility comments: increased effort, time, BUE use, use of bed rails, max assist for BLE mgt primarily  Transfers Overall transfer level: Needs assistance Equipment used: None Transfers: Lateral/Scoot Transfers          Lateral/Scoot Transfers: Max assist General transfer comment: attempted lateral scoots in bed, minimal participation of BLE throughout, was able to partially clear buttocks off bed but ultimately required max assist, decreased weight bearing through LUE as pt was guarding    Balance Overall balance assessment: Needs assistance Sitting-balance support: No upper extremity supported;Single extremity supported;Feet supported Sitting balance-Leahy Scale: Good  ADL either performed or assessed with clinical judgement   ADL Overall ADL's : Needs assistance/impaired Eating/Feeding: Sitting;Set up   Grooming:  Sitting;Set up   Upper Body Bathing: Sitting;Set up;Supervision/ safety   Lower Body Bathing: Bed level;Moderate assistance   Upper Body Dressing : Sitting;Set up;Min guard   Lower Body Dressing: Sitting/lateral leans;Moderate assistance Lower Body Dressing Details (indicate cue type and reason): educated in use of AE for LB dressing to improve functional independence and safety. pt verbalized understanding   Toilet Transfer Details (indicate cue type and reason): unsafe to attempt this date, likely max +2 for squat pivot                 Vision Baseline Vision/History: Wears glasses Wears Glasses: At all times Patient Visual Report: No change from baseline Vision Assessment?: No apparent visual deficits     Perception     Praxis      Pertinent Vitals/Pain Pain Assessment: No/denies pain     Hand Dominance Right   Extremity/Trunk Assessment Upper Extremity Assessment Upper Extremity Assessment: LUE deficits/detail (RUE WFL) LUE Deficits / Details: shoulder, elbow 4+/5, grip/pinch strength 4-/5; pt tends to guard use of L wrist, per pt report during bed mobility was using about 25% effort, no pain reported with weight bearing   Lower Extremity Assessment Lower Extremity Assessment: Defer to PT evaluation;Generalized weakness (dorsiflexion clonus noted bilaterally, decreased sensation, 2-/5 hip flexion, at least 3/5 knee ext/flex)   Cervical / Trunk Assessment Cervical / Trunk Assessment: Normal   Communication Communication Communication: No difficulties   Cognition Arousal/Alertness: Awake/alert Behavior During Therapy: WFL for tasks assessed/performed Overall Cognitive Status: Within Functional Limits for tasks assessed                                     General Comments  external urinary catheter removed, notified nurse tech at end of session    Exercises Hand Exercises Wrist Flexion: AROM;Left;10 reps Wrist Extension: AROM;Left;10  reps Wrist Ulnar Deviation: AROM;Left;10 reps Wrist Radial Deviation: 10 reps;AROM;Left Other Exercises Other Exercises: pt/family educated in energy conservation strategies including deep breathing, activity pacing, stress management, work simplification, DME/AE, and home/routines modifications to maximize functional independence and minimize risk of falls and prevent/minimize MS flares. Handout provided. Pt/family verbalized understanding of verbal, visual, and written instruction/demonstration provided. Other Exercises: Pt educated in wrist/hand AROM exercises to perform and weight bearing status in regards to functional use. Pt/family verbalized understanding.   Shoulder Instructions      Home Living Family/patient expects to be discharged to:: Skilled nursing facility Living Arrangements: Parent;Other relatives (mother and adult sister) Available Help at Discharge: Family;Available 24 hours/day Type of Home: House Home Access: Stairs to enter [employer] of Steps: 1 Entrance Stairs-Rails: Left;Right (can't reach both) Home Layout: One level     Bathroom Shower/Tub: Tub/shower unit;Curtain   Firefighter: Standard (std height with bilateral rails) Bathroom Accessibility: Yes How Accessible: Accessible via walker Home Equipment: Walker - 2 wheels;Wheelchair - manual;Grab bars - toilet;Hand held shower head   Additional Comments: reports old shower chair is broken, has been using a TTB at rehab that has worked well      Prior Functioning/Environment Level of Independence: Independent with assistive device(s)        Comments: Prior to the fall and wrist fracture, pt was modified independent with driving and ADL. Takes seated shower (shower chair is broken), mother does most cooking/cleaning,  pt does her own laundry; has been in rehab since wrist fx and surgical repair and had been working on slide board transfers from w/c prior to this admission.        OT  Problem List: Decreased strength;Decreased range of motion;Impaired sensation;Impaired UE functional use;Impaired balance (sitting and/or standing);Decreased knowledge of use of DME or AE;Decreased activity tolerance      OT Treatment/Interventions: Self-care/ADL training;Therapeutic exercise;DME and/or AE instruction;Energy conservation;Patient/family education    OT Goals(Current goals can be found in the care plan section) Acute Rehab OT Goals Patient Stated Goal: get stronger and return to PLOF OT Goal Formulation: With patient/family Time For Goal Achievement: 03/20/17 Potential to Achieve Goals: Good  OT Frequency: Min 1X/week   Barriers to D/C:            Co-evaluation              AM-PAC PT "6 Clicks" Daily Activity     Outcome Measure Help from another person eating meals?: None Help from another person taking care of personal grooming?: None Help from another person toileting, which includes using toliet, bedpan, or urinal?: A Lot Help from another person bathing (including washing, rinsing, drying)?: A Lot Help from another person to put on and taking off regular upper body clothing?: A Little Help from another person to put on and taking off regular lower body clothing?: A Lot 6 Click Score: 17   End of Session Equipment Utilized During Treatment: Gait belt Nurse Communication: Other (comment) (notified of external catheter removed)  Activity Tolerance: Patient tolerated treatment well Patient left: in bed;with call bell/phone within reach;with bed alarm set;with family/visitor present  OT Visit Diagnosis: Other abnormalities of gait and mobility (R26.89);Muscle weakness (generalized) (M62.81)                Time: 1610-9604 OT Time Calculation (min): 43 min Charges:  OT General Charges $OT Visit: 1 Visit OT Evaluation $OT Eval Moderate Complexity: 1 Mod OT Treatments $Self Care/Home Management : 8-22 mins $Therapeutic Exercise: 8-22 mins G-Codes: OT  G-codes **NOT FOR INPATIENT CLASS** Functional Assessment Tool Used: AM-PAC 6 Clicks Daily Activity;Clinical judgement Functional Limitation: Self care Self Care Current Status (V4098): At least 40 percent but less than 60 percent impaired, limited or restricted Self Care Goal Status (J1914): At least 20 percent but less than 40 percent impaired, limited or restricted   Richrd Prime, MPH, MS, OTR/L ascom [phone] 03/06/17, 3:20 PM

## 2017-03-06 NOTE — Progress Notes (Signed)
Pt saw today  This pm pt cont weak in legs. P.t. Stood pt by  Bed. Will see again tomorrow. Dr  Thad Ranger saw today. Baclofen atc now.  New iv site received 2nd dose of solumedrol this pm.

## 2017-03-06 NOTE — [place] FL2 (Signed)
[place] MEDICAID FL2 LEVEL OF CARE SCREENING TOOL     IDENTIFICATION  Patient Name: Lisa Williams Birthdate: [birth year] Sex: female Admission Date (Current Location): 03/05/2017  [place]   Facility and Address:  [facility], [address], [place], [place] [address]      Provider Number: [id number]  Attending Physician Name and Address:  Enedina FinnerPatel, Sona, MD  Relative Name and Phone Number:  Ansel BongRamseur,Sheila Sister [phone] or Laton,Sharon Sister [phone] or Peggyann Jubavans,Mary E Aunt [phone] or Vandenbosch,Margaret Mother [phone]     Current Level of Care: Hospital Recommended Level of Care: Skilled Nursing Facility Prior Approval Number:    Date Approved/Denied:   PASRR Number: [id number]  Discharge Plan: SNF    Current Diagnoses: Patient Active Problem List   Diagnosis Date Noted  . Wrist fracture 01/16/2017  . Health care maintenance 01/24/2016  . Blood pressure elevated without history of HTN 10/25/2015  . Multiple sclerosis (HCC) 10/02/2015  . Chronic left shoulder pain 07/19/2015  . Multiple sclerosis exacerbation (HCC) 07/14/2015  . MS (multiple sclerosis) (HCC) 11/26/2014  . Increased body mass index 11/26/2014  . HPV test positive 11/26/2014  . Status post laparoscopic supracervical hysterectomy 11/26/2014  . Galactorrhea 11/26/2014  . Tobacco user 11/26/2014  . Back ache 05/21/2014  . Adiposity 05/21/2014  . Disordered sleep 05/21/2014  . Muscle spasticity 05/21/2014  . Calculus of kidney 12/09/2013  . Renal colic 12/09/2013  . Hypercholesteremia 08/19/2013  . Hereditary and idiopathic neuropathy 08/19/2013  . Hypercholesterolemia without hypertriglyceridemia 08/19/2013  . Bladder infection, chronic 07/25/2012  . Disorder of bladder function 07/25/2012  . Incomplete bladder emptying 07/25/2012  . Microscopic hematuria 07/25/2012    Orientation  RESPIRATION BLADDER Height & Weight     Self, Time, Situation, Place  Normal Continent Weight: 244 lb (110.7 kg) Height:  5\' 11"  (180.3 cm)  BEHAVIORAL SYMPTOMS/MOOD NEUROLOGICAL BOWEL NUTRITION STATUS      Continent Diet (Cardiac)  AMBULATORY STATUS COMMUNICATION OF NEEDS Skin   Limited Assist Verbally Surgical wounds                       Personal Care Assistance Level of Assistance  Feeding, Dressing, Bathing Bathing Assistance: Limited assistance Feeding assistance: Independent Dressing Assistance: Limited assistance     Functional Limitations Info  Sight, Hearing, Speech Sight Info: Adequate Hearing Info: Adequate Speech Info: Adequate    SPECIAL CARE FACTORS FREQUENCY  PT (By licensed PT), OT (By licensed OT)     PT Frequency: 5x a week OT Frequency: 5x a week            Contractures Contractures Info: Not present    Additional Factors Info  Code Status, Allergies, Insulin Sliding Scale Code Status Info: Full Code Allergies Info: AMLODIPINE, POVIDONE IODINE   Insulin Sliding Scale Info: insulin aspart (novoLOG) injection 0-9 Units 3x a day with meals       Current Medications (03/06/2017):  This is the current hospital active medication list Current Facility-Administered Medications  Medication Dose Route Frequency Provider Last Rate Last Dose  . acetaminophen (TYLENOL) tablet 650 mg  650 mg Oral Q6H PRN Alford HighlandWieting, Richard, MD       Or  . acetaminophen (TYLENOL) suppository 650 mg  650 mg Rectal Q6H PRN Wieting, Richard, MD      . baclofen (LIORESAL) tablet 20 mg  20 mg Oral TID Thana Farreynolds, Leslie, MD   20 mg at 03/06/17 1516  .  calcium-vitamin D 500-200 MG-UNIT per tablet 1 tablet  1 tablet Oral Daily Alford Highland, MD   1 tablet at 03/06/17 0944  . docusate sodium (COLACE) capsule 100 mg  100 mg Oral Daily PRN Wieting, Richard, MD      . enoxaparin (LOVENOX) injection 40 mg  40 mg Subcutaneous Q24H Alford Highland, MD   40 mg at 03/05/17 2247  .  HYDROcodone-acetaminophen (NORCO/VICODIN) 5-325 MG per tablet 1 tablet  1 tablet Oral Q6H PRN Wieting, Richard, MD      . insulin aspart (novoLOG) injection 0-5 Units  0-5 Units Subcutaneous QHS Wieting, Richard, MD      . insulin aspart (novoLOG) injection 0-9 Units  0-9 Units Subcutaneous TID WC Wieting, Richard, MD      . losartan (COZAAR) tablet 100 mg  100 mg Oral Daily Alford Highland, MD   100 mg at 03/06/17 0944  . meloxicam (MOBIC) tablet 15 mg  15 mg Oral BID PRN Alford Highland, MD      . methylPREDNISolone sodium succinate (SOLU-MEDROL) 1,000 mg in sodium chloride 0.9 % 50 mL IVPB  1,000 mg Intravenous Daily Alford Highland, MD 58 mL/hr at 03/06/17 1516 1,000 mg at 03/06/17 1516  . metoCLOPramide (REGLAN) tablet 10 mg  10 mg Oral Q8H PRN Alford Highland, MD      . multivitamin with minerals tablet 1 tablet  1 tablet Oral Daily Alford Highland, MD   1 tablet at 03/06/17 0944  . ondansetron (ZOFRAN) tablet 4 mg  4 mg Oral Q6H PRN Alford Highland, MD       Or  . ondansetron [facility]) injection 4 mg  4 mg Intravenous Q6H PRN Wieting, Richard, MD      . oxybutynin (DITROPAN) tablet 5 mg  5 mg Oral BID Alford Highland, MD   5 mg at 03/06/17 0944  . polyethylene glycol (MIRALAX / GLYCOLAX) packet 17 g  17 g Oral Q48H PRN Wieting, Richard, MD      . temazepam (RESTORIL) capsule 7.5 mg  7.5 mg Oral QHS PRN Milagros Loll, MD   7.5 mg at 03/05/17 2342  . tiZANidine (ZANAFLEX) tablet 4 mg  4 mg Oral Q6H PRN Alford Highland, MD         Discharge Medications: Please see discharge summary for a list of discharge medications.  Relevant Imaging Results:  Relevant Lab Results:   Additional Information SSN [id number]  Darleene Cleaver, [place]

## 2017-03-06 NOTE — Evaluation (Signed)
Physical Therapy Evaluation Patient Details Name: Lisa Williams MRN: [id number] DOB: [birth year] Today's Date: 03/06/2017   History of Present Illness  Pt is a 56 y.o. female with a known history of MS and recent fall with wrist fracture who presents with LE weakness. Per pt, MD has cleared L wrist now WBAT, brace for comfort/protection if desired when going out. Pt is now admitted for MS exaccerbation.   Clinical Impression  Pt admitted with above diagnosis. Pt currently with functional limitations due to the deficits listed below (see PT Problem List).  Pt is very weak and requires mod/maxA+1 for bed mobility. Performed sit to stand transfer twice with maxA+2. Cues for sequencing and to lock knees once in standing. Pt with heavy UE support in standing. During first attempt pt is somewhat hesistant to put weight through LUE. During second attempt tried side stepping but pt unable to extend knees and eventually has an uncontrolled descend back onto bed. Unable to ambulate or perform static marching at this time. Will continue to progress with strengthening and mobility. Pt would benefit from practice with Lisa Williams lift for increased WB through bilateral LEs as well as sliding board transfers. She is significantly declined from her baseline function and will need SNF placement at discharge. Pt will benefit from PT services to address deficits in strength, balance, and mobility in order to return to full function at home.      Follow Up Recommendations SNF    Equipment Recommendations  None recommended by PT    Recommendations for Other Services Rehab consult     Precautions / Restrictions Precautions Precautions: Fall Restrictions Weight Bearing Restrictions: Yes LUE Weight Bearing: Weight bearing as tolerated Other Position/Activity Restrictions: LUE WBAT, no brace required; bilateral LE AFOs      Mobility  Bed Mobility Overal bed mobility: Needs Assistance Bed Mobility:  Supine to Sit;Sit to Supine     Supine to sit: Mod assist;HOB elevated Sit to supine: Max assist   General bed mobility comments: Pt requires HOB elevated, use of bed rails, and increased.   Transfers Overall transfer level: Needs assistance Equipment used: Rolling walker (2 wheeled) Transfers: Sit to/from Stand Sit to Stand: Max assist;+2 physical assistance        Lateral/Scoot Transfers: Max assist General transfer comment: Performed sit to stand transfer twice with maxA+2. Cues for sequencing and to lock knees once in standing. Pt with heavy UE support in standing. During first attempt pt is somewhat hesistant to put weight through LUE. During second attempt tried side stepping but pt unable to extend knees and eventually has an uncontrolled descend back onto bed. Unable to ambulate at this time  Ambulation/Gait             General Gait Details: Unable  Stairs            Wheelchair Mobility    Modified Rankin (Stroke Patients Only)       Balance Overall balance assessment: Needs assistance Sitting-balance support: No upper extremity supported Sitting balance-Leahy Scale: Good     Standing balance support: Bilateral upper extremity supported Standing balance-Leahy Scale: Zero Standing balance comment: Pt requires UE support on walker and modA+2 to remain in standing                             Pertinent Vitals/Pain Pain Assessment: No/denies pain    Home Living Family/patient expects to be discharged to:: Skilled nursing facility Living Arrangements:  Parent;Other relatives;Other (Comment) (Mother and adult sister) Available Help at Discharge: Family;Available 24 hours/day Type of Home: House Home Access: Stairs to enter Entrance Stairs-Rails: Left;Right (wide rails) Entrance Stairs-Number of Steps: 1 Home Layout: One level Home Equipment: Walker - 2 wheels;Wheelchair - manual;Grab bars - toilet;Hand held shower head Additional Comments:  reports old shower chair is broken, has been using a TTB at rehab that has worked well    Prior Function Level of Independence: Independent with assistive device(s)         Comments: Prior to the fall and wrist fracture, pt was modified independent with driving and ADL. Takes seated shower (shower chair is broken), mother does most cooking/cleaning, pt does her own laundry; has been in rehab since wrist fx and surgical repair and had been working on slide board transfers from w/c prior to this admission. Also working on standing with assistance     Hand Dominance   Dominant Hand: Right    Extremity/Trunk Assessment   Upper Extremity Assessment Upper Extremity Assessment: Defer to OT evaluation LUE Deficits / Details: shoulder, elbow 4+/5, grip/pinch strength 4-/5; pt tends to guard use of L wrist, per pt report during bed mobility was using about 25% effort, no pain reported with weight bearing    Lower Extremity Assessment Lower Extremity Assessment: Generalized weakness;RLE deficits/detail RLE Deficits / Details: Severe bilateral LE weakness with hip flexion <3/5 but able to demonstrate muscle contraction. Bilateral knee extension is 3/5 without AFO. She is unable to extend knees against gravity with AFOs donned. Absent bilateral ankle DF but able to extend great toes. Active plantarflexion. Denies N/T in bilateral LE. Increased plantarflexor spasticity bilaterally with clonus, most prominently on the LLE    Cervical / Trunk Assessment Cervical / Trunk Assessment: Normal  Communication   Communication: No difficulties  Cognition Arousal/Alertness: Awake/alert Behavior During Therapy: WFL for tasks assessed/performed Overall Cognitive Status: Within Functional Limits for tasks assessed                                        General Comments General comments (skin integrity, edema, etc.): external urinary catheter removed, notified nurse tech at end of  session    Exercises Hand Exercises Wrist Flexion: AROM;Left;10 reps Wrist Extension: AROM;Left;10 reps Wrist Ulnar Deviation: AROM;Left;10 reps Wrist Radial Deviation: 10 reps;AROM;Left Other Exercises Other Exercises: pt/family educated in energy conservation strategies including deep breathing, activity pacing, stress management, work simplification, DME/AE, and home/routines modifications to maximize functional independence and minimize risk of falls and prevent/minimize MS flares. Handout provided. Pt/family verbalized understanding of verbal, visual, and written instruction/demonstration provided. Other Exercises: Pt educated in wrist/hand AROM exercises to perform and weight bearing status in regards to functional use. Pt/family verbalized understanding.   Assessment/Plan    PT Assessment Patient needs continued PT services  PT Problem List Decreased strength;Decreased balance;Decreased mobility;Obesity;Impaired tone       PT Treatment Interventions DME instruction;Gait training;Functional mobility training;Therapeutic activities;Therapeutic exercise;Balance training;Neuromuscular re-education;Patient/family education;Wheelchair mobility training    PT Goals (Current goals can be found in the Care Plan section)  Acute Rehab PT Goals Patient Stated Goal: get stronger and return to PLOF PT Goal Formulation: With patient Time For Goal Achievement: 03/20/17 Potential to Achieve Goals: Good    Frequency Min 2X/week   Barriers to discharge        Co-evaluation  AM-PAC PT "6 Clicks" Daily Activity  Outcome Measure Difficulty turning over in bed (including adjusting bedclothes, sheets and blankets)?: Unable Difficulty moving from lying on back to sitting on the side of the bed? : Unable Difficulty sitting down on and standing up from a chair with arms (e.g., wheelchair, bedside commode, etc,.)?: Unable Help needed moving to and from a bed to chair (including  a wheelchair)?: Total Help needed walking in hospital room?: Total Help needed climbing 3-5 steps with a railing? : Total 6 Click Score: 6    End of Session Equipment Utilized During Treatment: Gait belt Activity Tolerance: Patient tolerated treatment well Patient left: in bed;with call bell/phone within reach;with nursing/sitter in room;Other (comment) (IV team in room)   PT Visit Diagnosis: Unsteadiness on feet (R26.81);Muscle weakness (generalized) (M62.81)    Time: 4193-7902 PT Time Calculation (min) (ACUTE ONLY): 30 min   Charges:   PT Evaluation $PT Eval Moderate Complexity: 1 Mod PT Treatments $Therapeutic Activity: 8-22 mins   PT G Codes:   PT G-Codes **NOT FOR INPATIENT CLASS** Functional Assessment Tool Used: AM-PAC 6 Clicks Basic Mobility Functional Limitation: Mobility: Walking and moving around Mobility: Walking and Moving Around Current Status (I0973): 100 percent impaired, limited or restricted Mobility: Walking and Moving Around Goal Status (Z3299): At least 40 percent but less than 60 percent impaired, limited or restricted    Lynnea Maizes PT, DPT    Anaaya Fuster 03/06/2017, 4:52 PM

## 2017-03-06 NOTE — Progress Notes (Addendum)
[facility]   PATIENT NAME: Lisa Williams    MR#:  [id number]  DATE OF BIRTH:  [birth year]  SUBJECTIVE:   Came in with increasing weakness in both lower extremity.  Feels a little better today. [facility] REVIEW OF SYSTEMS:   Review of Systems  Constitutional: Negative for chills, fever and weight loss.  HENT: Negative for ear discharge, ear pain and nosebleeds.   Eyes: Negative for blurred vision, pain and discharge.  Respiratory: Negative for sputum production, shortness of breath, wheezing and stridor.   Cardiovascular: Negative for chest pain, palpitations, orthopnea and PND.  Gastrointestinal: Negative for abdominal pain, diarrhea, nausea and vomiting.  Genitourinary: Negative for frequency and urgency.  Musculoskeletal: Negative for back pain and joint pain.  Neurological: Positive for weakness. Negative for sensory change, speech change and focal weakness.  Psychiatric/Behavioral: Negative for depression and hallucinations. The patient is not nervous/anxious.    Tolerating Diet:yes  Tolerating PT: pending  DRUG ALLERGIES:   Allergies  Allergen Reactions  . Amlodipine Other (See Comments)    edema  . Povidone Iodine Rash    VITALS:  Blood pressure (!) 121/55, pulse 70, temperature 98.1 F (36.7 C), temperature source Oral, resp. rate 20, height 5\' 11"  (1.803 m), weight 110.7 kg (244 lb), SpO2 100 %.  PHYSICAL EXAMINATION:   Physical Exam  GENERAL:  56 y.o.-year-old patient lying in the bed with no acute distress.  EYES: Pupils equal, round, reactive to light and accommodation. No scleral icterus. Extraocular muscles intact.  HEENT: Head atraumatic, normocephalic. Oropharynx and nasopharynx clear.  NECK:  Supple, no jugular venous distention. No thyroid enlargement, no tenderness.  LUNGS: Normal breath sounds bilaterally, no wheezing, rales, rhonchi. No use of accessory  muscles of respiration.  CARDIOVASCULAR: S1, S2 normal. No murmurs, rubs, or gallops.  ABDOMEN: Soft, nontender, nondistended. Bowel sounds present. No organomegaly or mass.  EXTREMITIES: No cyanosis, clubbing or edema b/l.    NEUROLOGIC: Cranial nerves II through XII are intact. No focal Motor or sensory deficits b/l.   PSYCHIATRIC:  patient is alert and oriented x 3.  SKIN: No obvious rash, lesion, or ulcer.   LABORATORY PANEL:  CBC  Recent Labs Lab 03/06/17 0504  WBC 7.6  HGB 12.5  HCT 36.9  PLT 213    Chemistries   Recent Labs Lab 03/06/17 0504  NA 138  K 3.9  CL 107  CO2 25  GLUCOSE 134*  BUN 23*  CREATININE 0.53  CALCIUM 9.0   Cardiac Enzymes No results for input(s): TROPONINI in the last 168 hours. RADIOLOGY:  No results found. ASSESSMENT AND PLAN:  Lisa Williams is an 56 y.o. female with a history of MS and recent fall with wrist fracture who presents with LE weakness.  Patient reports that she is usually able to help with transfers and walk with walker.  Yesterday AM was unable to help with transfer and was unable to ambulate, even with help, using walker.  Also reports some urinary incontinence.    1.  Multiple sclerosis exacerbation.   -pt is started on 1 g IV Solu-Medrol daily for 3 days.   -Neurology consultation appreciated - Physical therapy and occupational therapy evaluations.  The patient did come from rehab.  2.  Essential hypertension on losartan  3.  Recent wrist fracture.  Start OT.  4.  Constipation continue as needed medications   Case discussed with Care Management/Social Worker. Management  plans discussed with the patient, family and they are in agreement.  CODE STATUS: full  DVT Prophylaxis: lovenox  TOTAL TIME TAKING CARE OF THIS PATIENT: *30* minutes.  >50% time spent on counselling and coordination of care  POSSIBLE D/C IN *1-2* DAYS, DEPENDING ON CLINICAL CONDITION.  Note: This dictation was prepared with  Dragon dictation along with smaller phrase technology. Any transcriptional errors that result from this process are unintentional.  Tally Mattox M.D on 03/06/2017 at 1:53 PM  Between 7am to 6pm - Pager - [phone]  After 6pm go to [email or web address] - Social research officer, government  [facility]  Office  [phone]  CC: Primary care physician; Lauro Regulus, MD

## 2017-03-06 NOTE — Patient Outreach (Addendum)
[facility]) Care Management  03/06/2017  Rayola Flythe Kloehn 1961/02/23 [id number]   Telephone Screen  Referral Date: 03/05/17 Referral Source: [employer] UM Dept. Referral Reason: " member was at [employer]	[place] [facility] [employer] at [place] on skilled benefit, member no longer meets skilled care is now considered custodial care, please assist the member with next steps for her care" Insurance: [employer]    Referral received. Upon chart review noted that patient admitted to [facility] hospital on 03/05/17 for MS flare up. RN CM notified [facility] hospital liaison-Janci Minor, RN and requested follow up with patient. RN CM signing off case at this time as patient inpatient.    Plan: RN CM sent in basket message to [facility] hospital liaison.    Antionette Fairy, RN,BSN,CCM [facility] Care Management Telephonic Care Management Coordinator Direct Phone: [phone] Toll Free: [phone] Fax: [phone]

## 2017-03-07 ENCOUNTER — Other Ambulatory Visit: Payer: Self-pay | Admitting: *Deleted

## 2017-03-07 ENCOUNTER — Encounter: Payer: Self-pay | Admitting: *Deleted

## 2017-03-07 ENCOUNTER — Ambulatory Visit: Payer: PPO | Admitting: Hematology and Oncology

## 2017-03-07 ENCOUNTER — Ambulatory Visit: Payer: PPO

## 2017-03-07 LAB — GLUCOSE, CAPILLARY
GLUCOSE-CAPILLARY: 112 mg/dL — AB (ref 65–99)
Glucose-Capillary: 119 mg/dL — ABNORMAL HIGH (ref 65–99)

## 2017-03-07 NOTE — Progress Notes (Signed)
[facility] - [place] at [facility]   PATIENT NAME: Lisa Williams    MR#:  [id number]  DATE OF BIRTH:  August 27, 1960  SUBJECTIVE:   Came in with increasing weakness in both lower extremity.  Feels a little better today. [facility] REVIEW OF SYSTEMS:   Review of Systems  Constitutional: Negative for chills, fever and weight loss.  HENT: Negative for ear discharge, ear pain and nosebleeds.   Eyes: Negative for blurred vision, pain and discharge.  Respiratory: Negative for sputum production, shortness of breath, wheezing and stridor.   Cardiovascular: Negative for chest pain, palpitations, orthopnea and PND.  Gastrointestinal: Negative for abdominal pain, diarrhea, nausea and vomiting.  Genitourinary: Negative for frequency and urgency.  Musculoskeletal: Negative for back pain and joint pain.  Neurological: Positive for weakness. Negative for sensory change, speech change and focal weakness.  Psychiatric/Behavioral: Negative for depression and hallucinations. The patient is not nervous/anxious.    Tolerating Diet:yes  Tolerating PT: SNF  DRUG ALLERGIES:   Allergies  Allergen Reactions  . Amlodipine Other (See Comments)    edema  . Povidone Iodine Rash    VITALS:  Blood pressure (!) 133/59, pulse 78, temperature 98.1 F (36.7 C), temperature source Oral, resp. rate 15, height 5\' 11"  (1.803 m), weight 110.7 kg (244 lb), SpO2 100 %.  PHYSICAL EXAMINATION:   Physical Exam  GENERAL:  56 y.o.-year-old patient lying in the bed with no acute distress.  EYES: Pupils equal, round, reactive to light and accommodation. No scleral icterus. Extraocular muscles intact.  HEENT: Head atraumatic, normocephalic. Oropharynx and nasopharynx clear.  NECK:  Supple, no jugular venous distention. No thyroid enlargement, no tenderness.  LUNGS: Normal breath sounds bilaterally, no wheezing, rales, rhonchi. No use of accessory  muscles of respiration.  CARDIOVASCULAR: S1, S2 normal. No murmurs, rubs, or gallops.  ABDOMEN: Soft, nontender, nondistended. Bowel sounds present. No organomegaly or mass.  EXTREMITIES: No cyanosis, clubbing or edema b/l.    NEUROLOGIC: Cranial nerves II through XII are intact. No focal Motor or sensory deficits b/l.  Weakness++ LE4/5 PSYCHIATRIC:  patient is alert and oriented x 3.  SKIN: No obvious rash, lesion, or ulcer.   LABORATORY PANEL:  CBC  Recent Labs Lab 03/06/17 0504  WBC 7.6  HGB 12.5  HCT 36.9  PLT 213    Chemistries   Recent Labs Lab 03/06/17 0504  NA 138  K 3.9  CL 107  CO2 25  GLUCOSE 134*  BUN 23*  CREATININE 0.53  CALCIUM 9.0   Cardiac Enzymes No results for input(s): TROPONINI in the last 168 hours. RADIOLOGY:  No results found. ASSESSMENT AND PLAN:  Lisa Williams is an 56 y.o. female with a history of MS and recent fall with wrist fracture who presents with LE weakness.  Patient reports that she is usually able to help with transfers and walk with walker.  Yesterday AM was unable to help with transfer and was unable to ambulate, even with help, using walker.  Also reports some urinary incontinence.    1.  Multiple sclerosis exacerbation.   -pt is started on 1 g IV Solu-Medrol daily for 3 days. (completed) -Neurology consultation appreciated - Physical therapy and occupational therapy evaluations--recommends rehab -Awaiting medical insurance for approval for rehab.  2.  Essential hypertension on losartan  3.  Recent wrist fracture.  Start OT.  4.  Constipation continue as needed medications   Case discussed with Care Management/Social Worker.  Management plans discussed with the patient, family and they are in agreement.  CODE STATUS: full  DVT Prophylaxis: lovenox  TOTAL TIME TAKING CARE OF THIS PATIENT: *30* minutes.  >50% time spent on counselling and coordination of care  POSSIBLE D/C IN *1-2* DAYS, DEPENDING ON  CLINICAL CONDITION.  Note: This dictation was prepared with Dragon dictation along with smaller phrase technology. Any transcriptional errors that result from this process are unintentional.  Kynnadi Dicenso M.D on 03/07/2017 at 1:56 PM  Between 7am to 6pm - Pager - [phone]  After 6pm go to [email or web address] - Social research officer, government  [facility]  Office  [phone]  CC: Primary care physician; Lauro Regulus, MD

## 2017-03-07 NOTE — Care Management Important Message (Signed)
Important Message  Patient Details  Name: Lisa Williams MRN: [id number] Date of Birth: [birth year]   Medicare Important Message Given:  Yes    Gwenette Greet, RN 03/07/2017, 7:35 AM

## 2017-03-07 NOTE — Consult Note (Signed)
   [facility] CM Inpatient Consult   03/07/2017  Lisa Williams November 30, 1960 [id number]   Collaborated with [facility] Social Worker Toll BrothersChrystal Land regarding this patient. She made me aware she had spoken to [facility] Social Worker Minerva Areolaric about patient's discharge plan. Afterwards, this liaison spoke with [facility] Social Worker Minerva Areolaric to ask if I could be of any assistance. Minerva Areolaric stated he was waiting to hear back from [employer] [employer] on SNF authorization, and would go from there. I placed a call back to Chrystal to make her aware he was awaiting HTA decision. She was going to make them aware he had not received notification yet.   Did not speak with patient yet related to discharge plan not in place.   Costella HatcherJanci Dorothyann Mourer RN, BSN [facility]  Hospital Liaison  [phone]) Business Mobile [phone]) Toll free office

## 2017-03-07 NOTE — Patient Outreach (Addendum)
[facility]) Care Management  03/07/2017  Lisa Williams 10-20-1960 [id number]   Phone call to [name], social worker to discuss patient's discharge plan from [facility]. Per Minerva Areola, patient will discharge back to skilled care once authorized. Possible SNF stay at [facility], [facility] has no openings at this time. Authorization pending medical director review by insurance plan.    Plan: This Child psychotherapist will follow up with patient regarding care needs post discharge form [facility].   [name] [facility] Care Management [phone]

## 2017-03-07 NOTE — Clinical Social Work Note (Signed)
CSW spoke to patient and her family they would like to return back to [facility] if possible, CSW contacted [facility] and they do not currently have any beds available.  CSW informed patient and her sister, CSW is also awaiting for insurance company to make decision about SNF approval.  Patient was informed that she will have to consider other facilities, patient gave CSW permission to look in [place] and [place] for bed placement options.  Ervin Knack. Lisa Williams, MSW, Theresia Majors [phone]  03/07/2017 1:29 PM

## 2017-03-07 NOTE — Telephone Encounter (Signed)
This encounter was created in error - please disregard.

## 2017-03-07 NOTE — Progress Notes (Signed)
Physical Therapy Treatment Patient Details Name: Lisa Williams MRN: [id number] DOB: 1960-08-14 Today's Date: 03/07/2017    History of Present Illness Pt is a 56 y.o. female with a known history of MS and recent fall with wrist fracture who presents with LE weakness. Per pt, MD has cleared L wrist now WBAT, brace for comfort/protection if desired when going out. Pt is now admitted for MS exaccerbation.     PT Comments    Pt demonstrates excellent motivation to participate with therapy. She is able to complete all supine exercises as instructed by therapist. Unfortunately due to this therapist's schedule and lack of additional therapy staff pt is unable to attempt transfers or ambulation on this date with therapy. Will attempt on next date. May consider using Sabina lift to practice standing weight shifting with patient. Pt is highly motivated to improve and return to prior level of function which was modified independent ambulation with rolling walker just prior to her wrist fracture. She will need SNF placement at discharge from this admission. Pt demonstrates every ability to participate with therapy and make meaningful functional improvements with a SNF placement. She is not a custodial care patient. Pt will benefit from PT services to address deficits in strength, balance, and mobility in order to return to full function at home.      Follow Up Recommendations  SNF     Equipment Recommendations  None recommended by PT    Recommendations for Other Services Rehab consult     Precautions / Restrictions Precautions Precautions: Fall Restrictions Weight Bearing Restrictions: Yes LUE Weight Bearing: Weight bearing as tolerated Other Position/Activity Restrictions: LUE WBAT, no brace required; bilateral LE AFOs    Mobility  Bed Mobility               General bed mobility comments: Therapists schedule will not allow time for bed mobility, transfers, or attempted  ambulation at this time  Transfers                    Ambulation/Gait                 Stairs            Wheelchair Mobility    Modified Rankin (Stroke Patients Only)       Balance Overall balance assessment: Needs assistance Sitting-balance support: No upper extremity supported Sitting balance-Leahy Scale: Good                                      Cognition Arousal/Alertness: Awake/alert Behavior During Therapy: WFL for tasks assessed/performed Overall Cognitive Status: Within Functional Limits for tasks assessed                                        Exercises General Exercises - Lower Extremity Ankle Circles/Pumps: AROM;Both;10 reps;Supine Quad Sets: Strengthening;Both;10 reps;Supine Gluteal Sets: Strengthening;Both;10 reps;Supine Short Arc Quad: Strengthening;Both;10 reps;Supine Heel Slides: Strengthening;Both;10 reps;Supine Hip ABduction/ADduction: Strengthening;Both;10 reps;Supine Straight Leg Raises: Strengthening;Both;10 reps;Supine    General Comments        Pertinent Vitals/Pain Pain Assessment: No/denies pain    Home Living                      Prior Function  PT Goals (current goals can now be found in the care plan section) Acute Rehab PT Goals Patient Stated Goal: get stronger and return to PLOF PT Goal Formulation: With patient Time For Goal Achievement: 03/20/17 Potential to Achieve Goals: Good Progress towards PT goals: Progressing toward goals    Frequency    Min 2X/week      PT Plan Current plan remains appropriate    Co-evaluation              AM-PAC PT "6 Clicks" Daily Activity  Outcome Measure  Difficulty turning over in bed (including adjusting bedclothes, sheets and blankets)?: Unable Difficulty moving from lying on back to sitting on the side of the bed? : Unable Difficulty sitting down on and standing up from a chair with arms (e.g.,  wheelchair, bedside commode, etc,.)?: Unable Help needed moving to and from a bed to chair (including a wheelchair)?: Total Help needed walking in hospital room?: Total Help needed climbing 3-5 steps with a railing? : Total 6 Click Score: 6    End of Session   Activity Tolerance: Patient tolerated treatment well Patient left: in bed;with call bell/phone within reach;with family/visitor present;with bed alarm set   PT Visit Diagnosis: Unsteadiness on feet (R26.81);Muscle weakness (generalized) (M62.81)     Time: 8546-2703 PT Time Calculation (min) (ACUTE ONLY): 18 min  Charges:  $Therapeutic Exercise: 8-22 mins                    G Codes:       Sharalyn Ink Huprich PT, DPT     Huprich,Jason 03/07/2017, 5:15 PM

## 2017-03-07 NOTE — Progress Notes (Signed)
Subjective: Patient reports feeling some improvement in strength but still less than baseline.    Objective: Current vital signs: BP (!) 133/59 (BP Location: Right Arm)   Pulse 78   Temp 98.1 F (36.7 C) (Oral)   Resp 15   Ht 5\' 11"  (1.803 m)   Wt 110.7 kg (244 lb)   SpO2 100%   BMI 34.03 kg/m  Vital signs in last 24 hours: Temp:  [97.6 F (36.4 C)-98.1 F (36.7 C)] 98.1 F (36.7 C) (10/31 1346) Pulse Rate:  [62-78] 78 (10/31 1346) Resp:  [15-16] 15 (10/31 1346) BP: (119-142)/(59-65) 133/59 (10/31 1346) SpO2:  [98 %-100 %] 100 % (10/31 1346)  Intake/Output from previous day: 10/30 0701 - 10/31 0700 In: 666 [P.O.:600; IV Piggyback:66] Out: -  Intake/Output this shift: Total I/O In: 60 [P.O.:60] Out: 1 [Stool:1] Nutritional status: Diet regular Room service appropriate? Yes; Fluid consistency: Thin  Neurologic Exam: Mental Status: Alert, oriented, thought content appropriate.  Speech fluent without evidence of aphasia.  Able to follow 3 step commands without difficulty. Cranial Nerves: II: Discs flat bilaterally; Visual fields grossly normal, pupils equal, round, reactive to light and accommodation III,IV, VI: ptosis not present, extra-ocular motions intact bilaterally V,VII: smile symmetric, facial light touch sensation normal bilaterally VIII: hearing normal bilaterally IX,X: gag reflex present XI: bilateral shoulder shrug XII: midline tongue extension Motor: Right :  Upper extremity   5/5                                      Left:     Upper extremity   5/5             Lower extremity   3-/5                                                 Lower extremity   3-/5   Lab Results: Basic Metabolic Panel:  Recent Labs Lab 03/05/17 1558 03/06/17 0504  NA 138 138  K 4.4 3.9  CL 105 107  CO2 25 25  GLUCOSE 112* 134*  BUN 26* 23*  CREATININE 0.72 0.53  CALCIUM 9.5 9.0    Liver Function Tests: No results for input(s): AST, ALT, ALKPHOS, BILITOT, PROT, ALBUMIN  in the last 168 hours. No results for input(s): LIPASE, AMYLASE in the last 168 hours. No results for input(s): AMMONIA in the last 168 hours.  CBC:  Recent Labs Lab 03/05/17 1558 03/06/17 0504  WBC 8.8 7.6  NEUTROABS 5.7  --   HGB 13.0 12.5  HCT 38.6 36.9  MCV 79.1* 79.6*  PLT 232 213    Cardiac Enzymes: No results for input(s): CKTOTAL, CKMB, CKMBINDEX, TROPONINI in the last 168 hours.  Lipid Panel: No results for input(s): CHOL, TRIG, HDL, CHOLHDL, VLDL, LDLCALC in the last 168 hours.  CBG:  Recent Labs Lab 03/06/17 0741 03/06/17 1200 03/06/17 1703 03/07/17 0751 03/07/17 1134  GLUCAP 131* 121* 115* 119* 112*    Microbiology: Results for orders placed or performed during the hospital encounter of 01/16/17  Surgical PCR screen     Status: None   Collection Time: 01/16/17  3:00 PM  Result Value Ref Range Status   MRSA, PCR NEGATIVE NEGATIVE Final   Staphylococcus aureus NEGATIVE NEGATIVE Final  Comment: (NOTE) The Xpert SA Assay (FDA approved for NASAL specimens in patients 16 years of age and older), is one component of a comprehensive surveillance program. It is not intended to diagnose infection nor to guide or monitor treatment.     Coagulation Studies: No results for input(s): LABPROT, INR in the last 72 hours.  Imaging: No results found.  Medications:  I have reviewed the patient's current medications. Scheduled: . baclofen  20 mg Oral TID  . calcium-vitamin D  1 tablet Oral Daily  . enoxaparin (LOVENOX) injection  40 mg Subcutaneous Q24H  . insulin aspart  0-5 Units Subcutaneous QHS  . insulin aspart  0-9 Units Subcutaneous TID WC  . losartan  100 mg Oral Daily  . multivitamin with minerals  1 tablet Oral Daily  . oxybutynin  5 mg Oral BID    Assessment/Plan: Patient s/p 2 doses of Solumedrol.  One dose remaining later today.  Will not require a po taper.    Recommendations: 1.  Continue therapy 2.  Follow up with neurology as an  outpatient   LOS: 2 days   Thana Farr, MD Neurology [phone] 03/07/2017  2:02 PM

## 2017-03-08 MED ORDER — SODIUM CHLORIDE 0.9 % IV SOLN
1000.0000 mg | Freq: Every day | INTRAVENOUS | Status: DC
  Administered 2017-03-08: 1000 mg via INTRAVENOUS
  Filled 2017-03-08 (×2): qty 8

## 2017-03-08 NOTE — Progress Notes (Signed)
Subjective: Patient with continued lower extremity weakness.  Remains unable to stand.  Has tolerated 3 days of Solu-Medrol with no notable side effects.  Objective: Current vital signs: BP 140/64 (BP Location: Left Arm)   Pulse 62   Temp 98.3 F (36.8 C) (Oral)   Resp (!) 1   Ht 5\' 11"  (1.803 m)   Wt 110.7 kg (244 lb)   SpO2 100%   BMI 34.03 kg/m  Vital signs in last 24 hours: Temp:  [97.7 F (36.5 C)-98.3 F (36.8 C)] 98.3 F (36.8 C) (11/01 0817) Pulse Rate:  [51-78] 62 (11/01 0817) Resp:  [1-20] 1 (11/01 0817) BP: (116-142)/(49-77) 140/64 (11/01 0817) SpO2:  [94 %-100 %] 100 % (11/01 0817)  Intake/Output from previous day: 10/31 0701 - 11/01 0700 In: 60 [P.O.:60] Out: 1 [Stool:1] Intake/Output this shift: Total I/O In: -  Out: 800 [Urine:800] Nutritional status: Diet regular Room service appropriate? Yes; Fluid consistency: Thin  Neurologic Exam: Mental Status: Alert, oriented, thought content appropriate. Speech fluent without evidence of aphasia. Able to follow 3 step commands without difficulty. Cranial Nerves: II: Discs flat bilaterally; Visual fields grossly normal, pupils equal, round, reactive to light and accommodation III,IV, VI: ptosis not present, extra-ocular motions intact bilaterally V,VII: smile symmetric, facial light touch sensation normal bilaterally VIII: hearing normal bilaterally IX,X: gag reflex present XI: bilateral shoulder shrug XII: midline tongue extension Motor: Right :Upper extremity 5/5Left: Upper extremity 5/5 Lower extremity 3-/5Lower extremity 3-/5   Lab Results: Basic Metabolic Panel:  Recent Labs Lab 03/05/17 1558 03/06/17 0504  NA 138 138  K 4.4 3.9  CL 105 107  CO2 25 25  GLUCOSE 112* 134*  BUN 26* 23*  CREATININE 0.72 0.53  CALCIUM 9.5 9.0    Liver Function Tests: No results for input(s): AST,  ALT, ALKPHOS, BILITOT, PROT, ALBUMIN in the last 168 hours. No results for input(s): LIPASE, AMYLASE in the last 168 hours. No results for input(s): AMMONIA in the last 168 hours.  CBC:  Recent Labs Lab 03/05/17 1558 03/06/17 0504  WBC 8.8 7.6  NEUTROABS 5.7  --   HGB 13.0 12.5  HCT 38.6 36.9  MCV 79.1* 79.6*  PLT 232 213    Cardiac Enzymes: No results for input(s): CKTOTAL, CKMB, CKMBINDEX, TROPONINI in the last 168 hours.  Lipid Panel: No results for input(s): CHOL, TRIG, HDL, CHOLHDL, VLDL, LDLCALC in the last 168 hours.  CBG:  Recent Labs Lab 03/06/17 0741 03/06/17 1200 03/06/17 1703 03/07/17 0751 03/07/17 1134  GLUCAP 131* 121* 115* 119* 112*    Microbiology: Results for orders placed or performed during the hospital encounter of 01/16/17  Surgical PCR screen     Status: None   Collection Time: 01/16/17  3:00 PM  Result Value Ref Range Status   MRSA, PCR NEGATIVE NEGATIVE Final   Staphylococcus aureus NEGATIVE NEGATIVE Final    Comment: (NOTE) The Xpert SA Assay (FDA approved for NASAL specimens in patients 55 years of age and older), is one component of a comprehensive surveillance program. It is not intended to diagnose infection nor to guide or monitor treatment.     Coagulation Studies: No results for input(s): LABPROT, INR in the last 72 hours.  Imaging: No results found.  Medications:  I have reviewed the patient's current medications. Scheduled: . baclofen  20 mg Oral TID  . calcium-vitamin D  1 tablet Oral Daily  . enoxaparin (LOVENOX) injection  40 mg Subcutaneous Q24H  . losartan  100 mg Oral Daily  .  multivitamin with minerals  1 tablet Oral Daily  . oxybutynin  5 mg Oral BID    Assessment/Plan: Patient with MS exacerbation.  No notable improvement in strength after 3 days of Solu-Medrol.  Remains unable to stand.  Would likely benefit from 2 further days of Solu-Medrol for full 5-day course of IV  Solu-Medrol.  Recommendations:  1. Solu-Medrol 1 g IV every 24 hours for 2 further days.   2. Continue therapy.   LOS: 3 days   Thana Farr, MD Neurology [phone] 03/08/2017  1:02 PM

## 2017-03-08 NOTE — Progress Notes (Signed)
[facility] [place] [facility]   PATIENT NAME: Lisa Williams    MR#:  [id number]  DATE OF BIRTH:  Jul 29, 1960  SUBJECTIVE:   Came in with increasing weakness in both lower extremity.  Feels a little better today. [facility] REVIEW OF SYSTEMS:   Review of Systems  Constitutional: Negative for chills, fever and weight loss.  HENT: Negative for ear discharge, ear pain and nosebleeds.   Eyes: Negative for blurred vision, pain and discharge.  Respiratory: Negative for sputum production, shortness of breath, wheezing and stridor.   Cardiovascular: Negative for chest pain, palpitations, orthopnea and PND.  Gastrointestinal: Negative for abdominal pain, diarrhea, nausea and vomiting.  Genitourinary: Negative for frequency and urgency.  Musculoskeletal: Negative for back pain and joint pain.  Neurological: Positive for weakness. Negative for sensory change, speech change and focal weakness.  Psychiatric/Behavioral: Negative for depression and hallucinations. The patient is not nervous/anxious.    Tolerating Diet:yes  Tolerating PT: SNF  DRUG ALLERGIES:   Allergies  Allergen Reactions  . Amlodipine Other (See Comments)    edema  . Povidone Iodine Rash    VITALS:  Blood pressure 140/64, pulse 62, temperature 98.3 F (36.8 C), temperature source Oral, resp. rate (!) 1, height 5\' 11"  (1.803 m), weight 110.7 kg (244 lb), SpO2 100 %.  PHYSICAL EXAMINATION:   Physical Exam  GENERAL:  56 y.o.-year-old patient lying in the bed with no acute distress.  EYES: Pupils equal, round, reactive to light and accommodation. No scleral icterus. Extraocular muscles intact.  HEENT: Head atraumatic, normocephalic. Oropharynx and nasopharynx clear.  NECK:  Supple, no jugular venous distention. No thyroid enlargement, no tenderness.  LUNGS: Normal breath sounds bilaterally, no wheezing, rales, rhonchi. No use of accessory  muscles of respiration.  CARDIOVASCULAR: S1, S2 normal. No murmurs, rubs, or gallops.  ABDOMEN: Soft, nontender, nondistended. Bowel sounds present. No organomegaly or mass.  EXTREMITIES: No cyanosis, clubbing or edema b/l.    NEUROLOGIC: Cranial nerves II through XII are intact. No focal Motor or sensory deficits b/l.  Weakness++  PSYCHIATRIC:  patient is alert and oriented x 3.  SKIN: No obvious rash, lesion, or ulcer.   LABORATORY PANEL:  CBC  Recent Labs Lab 03/06/17 0504  WBC 7.6  HGB 12.5  HCT 36.9  PLT 213    Chemistries   Recent Labs Lab 03/06/17 0504  NA 138  K 3.9  CL 107  CO2 25  GLUCOSE 134*  BUN 23*  CREATININE 0.53  CALCIUM 9.0   Cardiac Enzymes No results for input(s): TROPONINI in the last 168 hours. RADIOLOGY:  No results found. ASSESSMENT AND PLAN:  Lisa Williams is an 56 y.o. female with a history of MS and recent fall with wrist fracture who presents with LE weakness.  Patient reports that she is usually able to help with transfers and walk with walker.  Yesterday AM was unable to help with transfer and was unable to ambulate, even with help, using walker.  Also reports some urinary incontinence.    1.  Multiple sclerosis exacerbation.   -pt is started on 1 g IV Solu-Medrol daily for 3 days. (completed) -Neurology consultation appreciated - Physical therapy and occupational therapy evaluations--recommends rehab. Pt's insurance denied rehab. Await further d/c planning  rom CSW and CM -Awaiting medical insurance for approval for rehab.  2.  Essential hypertension on losartan  3.  Recent wrist fracture.  Start OT.  Case discussed with Care Management/Social Worker. Management plans discussed with the patient, family and they are in agreement.  CODE STATUS: full  DVT Prophylaxis: lovenox  TOTAL TIME TAKING CARE OF THIS PATIENT: *30* minutes.  >50% time spent on counselling and coordination of care  POSSIBLE D/C IN *1-2*  DAYS, DEPENDING ON CLINICAL CONDITION.  Note: This dictation was prepared with Dragon dictation along with smaller phrase technology. Any transcriptional errors that result from this process are unintentional.  Monia Timmers M.D on 03/08/2017 at 9:03 AM  Between 7am to 6pm - Pager - [phone]  After 6pm go to [email or web address] - Social research officer, government  [facility]  Office  [phone]  CC: Primary care physician; Lauro Regulus, MD

## 2017-03-08 NOTE — Progress Notes (Signed)
Physical Therapy Treatment Patient Details Name: Delsa SaleJacqueline Renee Hamor MRN: [id number] DOB: 03-25-1961 Today's Date: 03/08/2017    History of Present Illness Pt is a 56 y.o. female with a known history of MS and recent fall with wrist fracture who presents with LE weakness. Per pt, MD has cleared L wrist now WBAT, brace for comfort/protection if desired when going out. Pt is now admitted for MS exaccerbation.     PT Comments    Patient continues to demonstrate exceptional motivation and participation/effort with all functional activities.  Showing consistent improvement in functional ability and overall activity tolerance, demonstrating ability to make functional gains and progress towards PLOF (mod indep with RW) with appropriate level of rehab services. Patient's largest barrier to this point was WBing restrictions of L wrist; now removed (pt WBAT) and no longer a barrier to participation/progress. Given above-referenced improvement, patient able to complete multiple sit/stand transfers (max assist +2) with RW and initiate OOB to chair via SPT with RW, max assist+2 (previously unsafe/unable to tolerate).  Patient and family very pleased with progress and performance; eager to continue. Upgraded discharge recommendations to CIR and increased treatment frequency to QD to allow patient maximal benefit of therapy services while able .   Follow Up Recommendations  CIR     Equipment Recommendations  Rolling walker with 5" wheels (may benefit from new RW (hers is >5 years and somewhat unsteady))    Recommendations for Other Services       Precautions / Restrictions Precautions Precautions: Fall Restrictions Weight Bearing Restrictions: Yes LUE Weight Bearing: Weight bearing as tolerated Other Position/Activity Restrictions: LUE WBAT, no brace required; bilateral LE AFOs    Mobility  Bed Mobility Overal bed mobility: Needs Assistance Bed Mobility: Supine to Sit     Supine to sit:  Mod assist     General bed mobility comments: patient self-assisting LEs as needed; additional assist for positioning and scooting towards edge of bed  Transfers Overall transfer level: Needs assistance Equipment used: Rolling walker (2 wheeled) Transfers: Sit to/from Stand Sit to Stand: Max assist;+2 physical assistance         General transfer comment: assist for lift off from elevated (but progressively lowered) bed surface; manual assist to facilitate bilat knee extension, lift off (prefers assist through UEs vs at waist/pelvis) and standing balance  Ambulation/Gait Ambulation/Gait assistance: Max assist;+2 physical assistance Ambulation Distance (Feet): 5 Feet Assistive device: Rolling walker (2 wheeled)       General Gait Details: broad BOS, manual facilitation for weight shifting and LE advancement (paper shoe cap placed over R shoe to decrease friction); heavy WBing bilat UEs (tolerates without difficulty); fair knee control/stability (tends to lock out for optimal stability)   Stairs            Wheelchair Mobility    Modified Rankin (Stroke Patients Only)       Balance Overall balance assessment: Needs assistance Sitting-balance support: No upper extremity supported;Feet supported Sitting balance-Leahy Scale: Good     Standing balance support: Bilateral upper extremity supported Standing balance-Leahy Scale: Poor                              Cognition Arousal/Alertness: Awake/alert Behavior During Therapy: WFL for tasks assessed/performed Overall Cognitive Status: Within Functional Limits for tasks assessed  General Comments: exceptional level of effort, participation and motivation to improve      Exercises Other Exercises Other Exercises: Unsupported sitting edge of bed, participates with dynamic balance/weight shifting activities required to don bilat AFOs and shoes.  Able to self-assist  LEs into figure-4 position for better access to lower legs/feet.  Appropriately dons AFOs indep, requiring mod assist for shoes (due to resistance required when rounding over heel of AFO).  Good trunk control, awareness of limits of stability noted with sitting balance activities. Other Exercises: Sit/stand x5 with RW, max assist +2--performed from elevated (but progressively lowered) bed surface; facilitation for bilat knee extension, lift off, anterior weight translation and static standing balance.  Able to progress to lateral weight shifting without difficulty, fair knee control in closed-chain activities.  Does require up to mod assist from therapist for weight shift adequate to unweight/advance contralateral LE for further progression towards mobility/gait efforts. Other Exercises: Rolling bilat, min/mod assist, for assistance with hygiene/clothing managemetn in supine.  Patient able to initiate/complete upper body rotation (using UEs on bedrails of bed), but requires assist for full rotation of pelvis and LEs. Increased LE adductor tone bilat noted with peri-care.    General Comments        Pertinent Vitals/Pain Pain Assessment: No/denies pain    Home Living                      Prior Function            PT Goals (current goals can now be found in the care plan section) Acute Rehab PT Goals Patient Stated Goal: get stronger and return to PLOF PT Goal Formulation: With patient Time For Goal Achievement: 03/20/17 Potential to Achieve Goals: Good Progress towards PT goals: Progressing toward goals    Frequency    7X/week      PT Plan Discharge plan needs to be updated;Frequency needs to be updated    Co-evaluation              AM-PAC PT "6 Clicks" Daily Activity  Outcome Measure  Difficulty turning over in bed (including adjusting bedclothes, sheets and blankets)?: Unable Difficulty moving from lying on back to sitting on the side of the bed? :  Unable Difficulty sitting down on and standing up from a chair with arms (e.g., wheelchair, bedside commode, etc,.)?: Unable Help needed moving to and from a bed to chair (including a wheelchair)?: A Lot Help needed walking in hospital room?: A Lot Help needed climbing 3-5 steps with a railing? : Total 6 Click Score: 8    End of Session Equipment Utilized During Treatment: Gait belt Activity Tolerance: Patient tolerated treatment well Patient left: in chair;with call bell/phone within reach;with chair alarm set;with family/visitor present Nurse Communication: Mobility status (recommend use of sit/stand lift for return to bed) PT Visit Diagnosis: Unsteadiness on feet (R26.81);Muscle weakness (generalized) (M62.81);Difficulty in walking, not elsewhere classified (R26.2)     Time: 8875-7972 PT Time Calculation (min) (ACUTE ONLY): 33 min  Charges:  $Gait Training: 8-22 mins $Therapeutic Activity: 8-22 mins                    G Codes:       Derrian Poli H. Manson Passey, PT, DPT, NCS 03/08/17, 1:12 PM [phone]

## 2017-03-08 NOTE — Clinical Social Work Note (Signed)
CSW received phone call from insurance company that patient was denied SNF placement.  Insurance company stated patient is considered custodial and they do not think she is rehab able.  Insurance company said physician can do a peer to peer to discuss patient's case, insurance company gave CSW phone number for Wellsite geologist.  CSW contacted attending physician, who agreed to do a peer to peer awaiting result from peer to peer.  CSW updated patient on status of insurance approval.  5:30pm  CSW received phone call from attending physician that patient was still denied after she spoke with Wellsite geologist at [employer].  CSW updated patient and informed her that PT is going to see her again and see if decision can be changed based on new PT note.  Patient was informed that if insurance company still denies, she will have to go home with home health or pay privately.  Patient stated she is unable to pay privately and she does not have funds to pay for in home care.  Patient does not have much support at home due to her parents having their own medical issues and a sister who works.  CSW to speak with case manager to discuss home health care options tomorrow.  Ervin Knack. Konstantinos Cordoba, MSW, Theresia Majors [phone]  03-07-17 6:15pm

## 2017-03-08 NOTE — Progress Notes (Signed)
Awaiting pt to complete bath to place PIV.

## 2017-03-08 NOTE — Progress Notes (Signed)
Medications administered by student RN [id number] with supervision of Clinical Instructor Marcelus Dubberly MSN, RN-BC or patient's assigned RN.   

## 2017-03-08 NOTE — Progress Notes (Signed)
Rehab Admissions Coordinator Note:  Patient was screened by Clois Dupes for appropriateness for an Inpatient Acute Rehab Consult per PT recommendation. Noted pt from [facility] SNF due to her wrist fracture. Also noted that SW is working with [employer] and acute MD has completed peer to peer with denial for SNF rehab thus far.  I will not pursue an inpt rehab admission at this time, for insurance currently will not even pursue coverage of SNF which is a lower level of care. Per Therapy recommendations today with WBAT for wrist fracture and MS exacerbation, I would think pt would be appropriate for an inpt rehab admission due to the medical neccesity need for close supervision of a Physician as well as intense three hours per day of therapy.Noted after receiving IV solumedrol , to have two additional days ordered by Dr. Thad Ranger due to no notable improvement after the initial 3 days given. Please call me with any questions. I will follow.  Clois Dupes 03/08/2017, 1:29 PM  I can be reached at [phone].

## 2017-03-09 LAB — GLUCOSE, CAPILLARY
GLUCOSE-CAPILLARY: 99 mg/dL (ref 65–99)
Glucose-Capillary: 183 mg/dL — ABNORMAL HIGH (ref 65–99)
Glucose-Capillary: 112 mg/dL — ABNORMAL HIGH (ref 65–99)
Glucose-Capillary: 133 mg/dL — ABNORMAL HIGH (ref 65–99)

## 2017-03-09 MED ORDER — SODIUM CHLORIDE 0.9 % IV SOLN
1000.0000 mg | Freq: Every day | INTRAVENOUS | Status: AC
  Administered 2017-03-09: 16:00:00 1000 mg via INTRAVENOUS
  Filled 2017-03-09: qty 8

## 2017-03-09 MED ORDER — MELOXICAM 15 MG PO TABS
15.0000 mg | ORAL_TABLET | Freq: Two times a day (BID) | ORAL | Status: DC | PRN
  Filled 2017-03-09: qty 2

## 2017-03-09 NOTE — Progress Notes (Signed)
Occupational Therapy Treatment Patient Details Name: Finley Dinkel Kemmer MRN: [id number] DOB: [birth year] Today's Date: 03/09/2017    History of present illness Pt is a 56 y.o. female with a known history of MS and recent fall with wrist fracture who presents with LE weakness. Per pt, MD has cleared L wrist now WBAT, brace for comfort/protection if desired when going out. Pt is now admitted for MS exaccerbation.    OT comments  Pt. Continues to present with LUE weakness, limited activity tolerance, and impaired functional mobility which limit her ability top complete ADL tasks. Pt. Education was provided about A/E, and DME. Pt. Was assisted in helping to problem solve through anticipated DME needs, and her current bathroom set-up at home. Pt. Could benefit from an extra wide BSCommode, and a transfer tub bench with commode access. Pt. Continues to benefit from skilled OT services for ADL training, A/E training, functional mobility, and pt. Education about DME, and energy conservation/work simplification techniques. Pt. continues to be appropriate for SNF level of care upon discharge. Follow-up OT services are warranted.     Follow Up Recommendations  SNF    Equipment Recommendations  Tub/shower bench;Other (comment) (Transfer tub bench with commode access.)    Recommendations for Other Services      Precautions / Restrictions Precautions Precautions: Fall Restrictions Weight Bearing Restrictions: Yes LUE Weight Bearing: Weight bearing as tolerated Other Position/Activity Restrictions: LUE WBAT, no brace required; bilateral LE AFOs              ADL either performed or assessed with clinical judgement   ADL Overall ADL's : Needs assistance/impaired Eating/Feeding: Set up   Grooming: Set up       Lower Body Bathing: Moderate assistance   Upper Body Dressing : Sitting;Set up;Min guard   Lower Body Dressing: Sitting/lateral leans;Moderate assistance                 General ADL Comments: Pt. education was provided about DME.     Vision Baseline Vision/History: Wears glasses Wears Glasses: At all times Patient Visual Report: No change from baseline Vision Assessment?: No apparent visual deficits   Perception     Praxis      Cognition Arousal/Alertness: Awake/alert Behavior During Therapy: WFL for tasks assessed/performed Overall Cognitive Status: Within Functional Limits for tasks assessed                                 General Comments: Pt very motivated and eager to work with PT        Exercises AROM Left wrist, intrinsic stretches.  Shoulder Instructions       General Comments Notified RN of pt's reports of feeling like she did before her last fall when she had a UTI.  Pt's BP reading 158/87 in sitting.      Pertinent Vitals/ Pain       Pain Assessment: No/denies pain Faces Pain Scale: Hurts a little bit Pain Location: L wrist in WBing Pain Descriptors / Indicators: Discomfort Pain Intervention(s): Limited activity within patient's tolerance;Monitored during session  Home Living  Refer to initial Evaluation                                        Prior Functioning/Environment   Refer to initial Evaluation  Frequency  Min 1X/week        Progress Toward Goals  OT Goals(current goals can now be found in the care plan section)     Acute Rehab OT Goals Patient Stated Goal: get stronger and return to PLOF  Plan      Co-evaluation                 AM-PAC PT "6 Clicks" Daily Activity     Outcome Measure   Help from another person eating meals?: None Help from another person taking care of personal grooming?: None Help from another person toileting, which includes using toliet, bedpan, or urinal?: A Lot Help from another person bathing (including washing, rinsing, drying)?: A Lot Help from another person to put on and taking off regular upper body clothing?: A  Little Help from another person to put on and taking off regular lower body clothing?: A Lot 6 Click Score: 17    End of Session Equipment Utilized During Treatment: Gait belt  OT Visit Diagnosis: Other abnormalities of gait and mobility (R26.89);Muscle weakness (generalized) (M62.81)   Activity Tolerance Patient tolerated treatment well   Patient Left in bed;with call bell/phone within reach;with bed alarm set   Nurse Communication      Functional Assessment Tool Used: AM-PAC 6 Clicks Daily Activity;Clinical judgement Functional Limitation: Self care Self Care Current Status (Z6109(G8987): At least 40 percent but less than 60 percent impaired, limited or restricted Self Care Goal Status (U0454(G8988): At least 20 percent but less than 40 percent impaired, limited or restricted   Time: 1431-1455 OT Time Calculation (min): 24 min  Charges: OT G-codes **NOT FOR INPATIENT CLASS** Functional Assessment Tool Used: AM-PAC 6 Clicks Daily Activity;Clinical judgement Functional Limitation: Self care Self Care Current Status (U9811(G8987): At least 40 percent but less than 60 percent impaired, limited or restricted Self Care Goal Status (B1478(G8988): At least 20 percent but less than 40 percent impaired, limited or restricted OT General Charges $OT Visit: 1 Visit OT Treatments $Self Care/Home Management : 23-37 mins   Olegario MessierElaine Hobart Marte, MS, OTR/L  Olegario MessierElaine Aubriee Szeto, MS, OTR/L 03/09/2017, 4:06 PM

## 2017-03-09 NOTE — Progress Notes (Signed)
[facility] [place] at [facility]   PATIENT NAME: Lisa Williams    MR#:  [id number]  DATE OF BIRTH:  February 22, 1961  SUBJECTIVE:   Came in with increasing weakness in both lower extremity.  Feels a little better today. She was at [facility] after her wrist fracture surgery Patient said she could transfer yesterday from bed to chair with physical therapy. REVIEW OF SYSTEMS:   Review of Systems  Constitutional: Negative for chills, fever and weight loss.  HENT: Negative for ear discharge, ear pain and nosebleeds.   Eyes: Negative for blurred vision, pain and discharge.  Respiratory: Negative for sputum production, shortness of breath, wheezing and stridor.   Cardiovascular: Negative for chest pain, palpitations, orthopnea and PND.  Gastrointestinal: Negative for abdominal pain, diarrhea, nausea and vomiting.  Genitourinary: Negative for frequency and urgency.  Musculoskeletal: Negative for back pain and joint pain.  Neurological: Positive for weakness. Negative for sensory change, speech change and focal weakness.       Bilateral lower extremity weakness  Psychiatric/Behavioral: Negative for depression and hallucinations. The patient is not nervous/anxious.    Tolerating Diet:yes  Tolerating PT: SNF  DRUG ALLERGIES:   Allergies  Allergen Reactions  . Amlodipine Other (See Comments)    edema  . Povidone Iodine Rash    VITALS:  Blood pressure (!) 158/87, pulse 90, temperature 97.7 F (36.5 C), temperature source Oral, resp. rate 16, height 5\' 11"  (1.803 m), weight 110.7 kg (244 lb), SpO2 96 %.  PHYSICAL EXAMINATION:   Physical Exam  GENERAL:  56 y.o.-year-old patient lying in the bed with no acute distress.  EYES: Pupils equal, round, reactive to light and accommodation. No scleral icterus. Extraocular muscles intact.  HEENT: Head atraumatic, normocephalic. Oropharynx and nasopharynx clear.  NECK:  Supple, no jugular venous distention. No thyroid  enlargement, no tenderness.  LUNGS: Normal breath sounds bilaterally, no wheezing, rales, rhonchi. No use of accessory muscles of respiration.  CARDIOVASCULAR: S1, S2 normal. No murmurs, rubs, or gallops.  ABDOMEN: Soft, nontender, nondistended. Bowel sounds present. No organomegaly or mass.  EXTREMITIES: No cyanosis, clubbing or edema b/l.    NEUROLOGIC: Cranial nerves II through XII are intact. No focal Motor or sensory deficits b/l.  Weakness++  PSYCHIATRIC:  patient is alert and oriented x 3.  SKIN: No obvious rash, lesion, or ulcer.   LABORATORY PANEL:  CBC  Recent Labs Lab 03/06/17 0504  WBC 7.6  HGB 12.5  HCT 36.9  PLT 213    Chemistries   Recent Labs Lab 03/06/17 0504  NA 138  K 3.9  CL 107  CO2 25  GLUCOSE 134*  BUN 23*  CREATININE 0.53  CALCIUM 9.0   Cardiac Enzymes No results for input(s): TROPONINI in the last 168 hours. RADIOLOGY:  No results found. ASSESSMENT AND PLAN:  Lisa Williams is an 56 y.o. female with a history of MS and recent fall with wrist fracture who presents with LE weakness.  Patient reports that she is usually able to help with transfers and walk with walker.  Yesterday AM was unable to help with transfer and was unable to ambulate, even with help, using walker.  Also reports some urinary incontinence.    1.  Multiple sclerosis exacerbation.   -pt is started on 1 g IV Solu-Medrol daily for 5 days. (completed) -Neurology consultation appreciated - Physical therapy and occupational therapy evaluations--recommends rehab. Pt's insurance denied rehab. Await further d/c planning  rom CSW and CM -Patient's medical insurance  has declined further rehab days.  Patient has appealed her denial.  Awaiting for decision -Continue in-house physical therapy  2.  Essential hypertension on losartan  3.  Recent wrist fracture.  Started OT.    Case discussed with Care Management/Social Worker. Management plans discussed with the patient,  family and they are in agreement.  CODE STATUS: full  DVT Prophylaxis: lovenox  TOTAL TIME TAKING CARE OF THIS PATIENT: *30* minutes.  >50% time spent on counselling and coordination of care  POSSIBLE D/C IN *1-2* DAYS, DEPENDING ON CLINICAL CONDITION.  Note: This dictation was prepared with Dragon dictation along with smaller phrase technology. Any transcriptional errors that result from this process are unintentional.  Tysin Salada M.D on 03/09/2017 at 11:33 AM  Between 7am to 6pm - Pager - [phone]  After 6pm go to [email or web address] - Social research officer, governmentpassword EPAS [facility]  [facility]  Office  [phone]  CC: Primary care physician; Lauro RegulusAnderson, Marshall W, MD

## 2017-03-09 NOTE — Clinical Social Work Note (Addendum)
Clinical Social Work Assessment  Patient Details  Name: Lisa Williams MRN: [id number] Date of Birth: 1960-11-08  Date of referral:  03/07/17               Reason for consult:  Facility Placement                Permission sought to share information with:  Facility Medical sales representative, Family Supports Permission granted to share information::  Yes, Verbal Permission Granted  Name::     Lisa, Williams [phone] or Lisa, Williams [phone]   Agency::  SNF admissions  Relationship::     Contact Information:     Housing/Transportation Living arrangements for the past 2 months:  Skilled Nursing Facility, Single Family Home Source of Information:  Patient Patient Interpreter Needed:  None Criminal Activity/Legal Involvement Pertinent to Current Situation/Hospitalization:  No - Comment as needed Significant Relationships:  Siblings, Parents Lives with:  Siblings, Parents Do you feel safe going back to the place where you live?  No Need for family participation in patient care:  No (Coment)  Care giving concerns:  Patient feels she needs to continue with her therapy before she is able to return back home.   Social Worker assessment / plan: Patient is a 56 year old female who is alert and oriented x4.  Patient was at [facility] receiving rehab then she had a MS flair up and was admitted to the hospital.  Patient states she is normally able to walk around with a walker and able to drive herself around, and complete ADLs, but currently she is unable to due to being weak and having a MS flare up.  Patient was receiving therapy until approximately two weeks ago, insurance company denied patient to get anymore therapy at [facility]. Patient appealed the decision and it was denied, once, then she appealed again and is in the process of waiting for results from second appeal.  Patient states she would like to restart her therapy due to weakness, and  not being able to walk at this time, however patient was denied again for SNF placement from the hospital.  Patient has appealed the decision again and is waiting for results from the insurance company.  Patient gave CSW permission to begin bed search in [place] and [place] to look for placement.  Employment status:  Disabled (Comment on whether or not currently receiving Disability) Insurance information:  Managed Medicare PT Recommendations:  Skilled Holiday representative, Inpatient Rehab Consult Information / Referral to community resources:  Skilled Nursing Facility  Patient/Family's Response to care:  Patient would like to return to [facility] or another SNF to continue with therapy.  Patient/Family's Understanding of and Emotional Response to Diagnosis, Current Treatment, and Prognosis:  Patient expressed frustration that her insurance company is denying rehab for patient.  She has appealed and is hoping the decision will be overturned.  Emotional Assessment Appearance:  Appears stated age Attitude/Demeanor/Rapport:    Affect (typically observed):  Appropriate, Calm, Stable Orientation:  Oriented to Self, Oriented to  Time, Oriented to Situation, Oriented to Place Alcohol / Substance use:  Not Applicable Psych involvement (Current and /or in the community):  No (Comment)  Discharge Needs  Concerns to be addressed:  Lack of Support Readmission within the last 30 days:  No Current discharge risk:  Lack of support system Barriers to Discharge:  Continued Medical Work up, [employer]   [place] Constable 03/07/17, 6:44 PM

## 2017-03-09 NOTE — Progress Notes (Signed)
Physical Therapy Treatment Patient Details Name: Lisa Williams MRN: [id number] DOB: [birth year] Today's Date: 03/09/2017    History of Present Illness Pt is a 56 y.o. female with a known history of MS and recent fall with wrist fracture who presents with LE weakness. Per pt, MD has cleared L wrist now WBAT, brace for comfort/protection if desired when going out. Pt is now admitted for MS exaccerbation.     PT Comments    [name] continues to be extremely motivated and eager to work with PT.  She performs sit<>stand x6 from bed with improvement in stability on each attempt.  However, pt reports "I am just not feeling right today, it's like a fog.  It feels how I felt before the last time I fell and when I had a UTI".  Pt visibly frustrated with this but remains motivated.  Pt with imbalance, specifically anteriorly/posteriorly when standing. Pt's BP reading 158/87 in sitting. Follow up recommendation for CIR remains appropriate as pt is an excellent candidate for intensive therapeutic intervention.    Follow Up Recommendations  CIR     Equipment Recommendations  Rolling walker with 5" wheels (current RW is 56 years old and unsteady)    Recommendations for Other Services Rehab consult     Precautions / Restrictions Precautions Precautions: Fall Restrictions Weight Bearing Restrictions: Yes LUE Weight Bearing: Weight bearing as tolerated Other Position/Activity Restrictions: LUE WBAT, no brace required; bilateral LE AFOs    Mobility  Bed Mobility Overal bed mobility: Needs Assistance Bed Mobility: Supine to Sit;Sit to Supine     Supine to sit: Mod assist;HOB elevated Sit to supine: Mod assist;+2 for physical assistance   General bed mobility comments: Assist provided to advance LEs to EOB as pt peforms supine>sit with increased effort.  Assist to trunk and BLEs to return to supine.   Transfers Overall transfer level: Needs assistance Equipment used: Rolling  walker (2 wheeled) Transfers: Sit to/from Stand Sit to Stand: Max assist;+2 physical assistance;From elevated surface         General transfer comment: Assist to block R knee and promote R knee extension as pt rises into standing.  Assist provided to boost to standing (assist provided at arms as pt prefers not to have assist from gait belt).  Pt performed x6 as pt very determined, with each trial showing improvement in balance.  However, pt reports several times, "I am just not feeling right today, it's like a fog.  It feels how I felt before the last time I fell and when I had a UTI".  Pt with imbalance, specifically anteriorly/posteriorly when standing.   Ambulation/Gait             General Gait Details: Unable due to pt's instability and her reports of "not feeling right, feeling foggy"   Stairs            Wheelchair Mobility    Modified Rankin (Stroke Patients Only)       Balance Overall balance assessment: Needs assistance;History of Falls Sitting-balance support: No upper extremity supported;Feet supported Sitting balance-Lisa Williams Scale: Good     Standing balance support: Bilateral upper extremity supported;During functional activity Standing balance-Lisa Williams Scale: Poor Standing balance comment: Relies on UE support for static and dynamic activities with up to mod +2 assist to remain steady standing at bedside                            Cognition Arousal/Alertness:  Awake/alert Behavior During Therapy: WFL for tasks assessed/performed Overall Cognitive Status: Within Functional Limits for tasks assessed                                 General Comments: Pt very motivated and eager to work with PT      Exercises Other Exercises Other Exercises: L wrist circles AROM as pt reports L wrist feels sore today     General Comments General comments (skin integrity, edema, etc.): Notified RN of pt's reports of feeling like she did before her  last fall when she had a UTI.  Pt's BP reading 158/87 in sitting.        Pertinent Vitals/Pain Pain Assessment: Faces Faces Pain Scale: Hurts a little bit Pain Location: L wrist in WBing Pain Descriptors / Indicators: Discomfort Pain Intervention(s): Limited activity within patient's tolerance;Monitored during session    Home Living                      Prior Function            PT Goals (current goals can now be found in the care plan section) Acute Rehab PT Goals Patient Stated Goal: get stronger and return to PLOF PT Goal Formulation: With patient Time For Goal Achievement: 03/20/17 Potential to Achieve Goals: Good Progress towards PT goals: Not progressing toward goals - comment (due to instability )    Frequency    7X/week      PT Plan Current plan remains appropriate    Co-evaluation              AM-PAC PT "6 Clicks" Daily Activity  Outcome Measure  Difficulty turning over in bed (including adjusting bedclothes, sheets and blankets)?: Unable Difficulty moving from lying on back to sitting on the side of the bed? : Unable Difficulty sitting down on and standing up from a chair with arms (e.g., wheelchair, bedside commode, etc,.)?: Unable Help needed moving to and from a bed to chair (including a wheelchair)?: A Lot Help needed walking in hospital room?: A Lot Help needed climbing 3-5 steps with a railing? : Total 6 Click Score: 8    End of Session Equipment Utilized During Treatment: Gait belt Activity Tolerance: Patient tolerated treatment well Patient left: in bed;with call bell/phone within reach;with bed alarm set Nurse Communication: Mobility status;Other (comment) (BP; see general comments) PT Visit Diagnosis: Unsteadiness on feet (R26.81);Muscle weakness (generalized) (M62.81);Difficulty in walking, not elsewhere classified (R26.2)     Time: [id number] PT Time Calculation (min) (ACUTE ONLY): 32 min  Charges:  $Therapeutic Activity:  23-37 mins                    G Codes:       Lisa ChuAshley Williams PT, DPT 03/09/2017, 2:47 PM

## 2017-03-09 NOTE — Clinical Social Work Note (Signed)
CSW received phone call from Katrina at [employer] [phone] requesting copies of patient's medical records per patient request due to patient appealing decision to deny her to go to SNF.  CSW informed insurance company that this CSW can not provide medical records for patient, and that insurance would have to contact Medical Records in hospital.  CSW gave insurance company the hospital's phone number so they can get in contact with medical records.  According to insurance company they have to provide documentation within 72 hours to third party review team, and inform patient of decision.  CSW to continue to follow patient's progress.  Lisa Williams. Lisa Williams, MSW, Theresia Majors [phone]  03/09/2017 6:40 PM

## 2017-03-09 NOTE — Progress Notes (Signed)
Subjective: Patient reports some improvement in strength.  Was able to transfer on yesterday.    Objective: Current vital signs: BP 132/85 (BP Location: Left Arm)   Pulse (!) 57   Temp 97.7 F (36.5 C) (Oral)   Resp 16   Ht 5\' 11"  (1.803 m)   Wt 110.7 kg (244 lb)   SpO2 96%   BMI 34.03 kg/m  Vital signs in last 24 hours: Temp:  [97.7 F (36.5 C)-98.6 F (37 C)] 97.7 F (36.5 C) (11/02 0407) Pulse Rate:  [57-73] 57 (11/02 0407) Resp:  [14-18] 16 (11/02 0407) BP: (131-138)/(62-85) 132/85 (11/02 0407) SpO2:  [96 %-100 %] 96 % (11/02 0407)  Intake/Output from previous day: 11/01 0701 - 11/02 0700 In: 240 [P.O.:240] Out: 800 [Urine:800] Intake/Output this shift: No intake/output data recorded. Nutritional status: Diet regular Room service appropriate? Yes; Fluid consistency: Thin  Neurologic Exam: Mental Status: Alert, oriented, thought content appropriate. Speech fluent without evidence of aphasia. Able to follow 3 step commands without difficulty. Cranial Nerves: II: Discs flat bilaterally; Visual fields grossly normal, pupils equal, round, reactive to light and accommodation III,IV, VI: ptosis not present, extra-ocular motions intact bilaterally V,VII: smile symmetric, facial light touch sensation normal bilaterally VIII: hearing normal bilaterally IX,X: gag reflex present XI: bilateral shoulder shrug XII: midline tongue extension Motor: Right :Upper extremity 5/5Left: Upper extremity 5/5 Lower extremity 3-/5Lower extremity 3+/5  Lab Results: Basic Metabolic Panel:  Recent Labs Lab 03/05/17 1558 03/06/17 0504  NA 138 138  K 4.4 3.9  CL 105 107  CO2 25 25  GLUCOSE 112* 134*  BUN 26* 23*  CREATININE 0.72 0.53  CALCIUM 9.5 9.0    Liver Function Tests: No results for input(s): AST, ALT, ALKPHOS, BILITOT, PROT, ALBUMIN in the last 168 hours. No  results for input(s): LIPASE, AMYLASE in the last 168 hours. No results for input(s): AMMONIA in the last 168 hours.  CBC:  Recent Labs Lab 03/05/17 1558 03/06/17 0504  WBC 8.8 7.6  NEUTROABS 5.7  --   HGB 13.0 12.5  HCT 38.6 36.9  MCV 79.1* 79.6*  PLT 232 213    Cardiac Enzymes: No results for input(s): CKTOTAL, CKMB, CKMBINDEX, TROPONINI in the last 168 hours.  Lipid Panel: No results for input(s): CHOL, TRIG, HDL, CHOLHDL, VLDL, LDLCALC in the last 168 hours.  CBG:  Recent Labs Lab 03/06/17 1703 03/07/17 0751 03/07/17 1134 03/08/17 2101 03/09/17 0743  GLUCAP 115* 119* 112* 183* 112*    Microbiology: Results for orders placed or performed during the hospital encounter of 01/16/17  Surgical PCR screen     Status: None   Collection Time: 01/16/17  3:00 PM  Result Value Ref Range Status   MRSA, PCR NEGATIVE NEGATIVE Final   Staphylococcus aureus NEGATIVE NEGATIVE Final    Comment: (NOTE) The Xpert SA Assay (FDA approved for NASAL specimens in patients 55 years of age and older), is one component of a comprehensive surveillance program. It is not intended to diagnose infection nor to guide or monitor treatment.     Coagulation Studies: No results for input(s): LABPROT, INR in the last 72 hours.  Imaging: No results found.  Medications:  I have reviewed the patient's current medications. Scheduled: . baclofen  20 mg Oral TID  . calcium-vitamin D  1 tablet Oral Daily  . enoxaparin (LOVENOX) injection  40 mg Subcutaneous Q24H  . losartan  100 mg Oral Daily  . multivitamin with minerals  1 tablet Oral Daily  . oxybutynin  5 mg Oral BID    Assessment/Plan: Patient with some LLE strength improvement.  To have five doses of IV Solumedrol prior to discharge.  Will not require po taper at discharge.     LOS: 4 days   Thana Farr, MD Neurology [phone] 03/09/2017  10:16 AM

## 2017-03-09 NOTE — Consult Note (Signed)
   [facility] CM Inpatient Consult   03/09/2017  Lisa Williams Sep 27, 1960 [id number]   Patient is on the [facility] registry as a benefit of their [employer] medicare. Spoke with inpatient case manager about patient's discharge plan. Inpatient case manager stating there is not a definitive plan in place at this point. Inpatient case manager felt it was not an appropriate time to introduce [facility] Case Management services with patient related to discharge disposition still undecided. If patient discharges to home please place a [facility] Care Management consult. For questions please contact:   Mayo Faulk RN, BSN [facility] Liaison  [phone]) Business Mobile [phone]) Toll free office

## 2017-03-10 LAB — CREATININE, SERUM
Creatinine, Ser: 0.63 mg/dL (ref 0.44–1.00)
GFR calc Af Amer: >60 mL/min (ref >60)

## 2017-03-10 NOTE — Progress Notes (Signed)
Pt refused 0800 FSBS stick by NT this am; pt verbalized that she is not on Solu-Medrol anymore

## 2017-03-10 NOTE — Progress Notes (Signed)
[facility] - [place] at [facility]   PATIENT NAME: Lisa Williams    MR#:  [id number]  DATE OF BIRTH:  [birth year]  SUBJECTIVE:   Patient reports improvement in strength of her upper extremity and lower extremity According to the nurse required 2 person assist to get her to a chair REVIEW OF SYSTEMS:   Review of Systems  Constitutional: Negative for chills, fever and weight loss.  HENT: Negative for ear discharge, ear pain and nosebleeds.   Eyes: Negative for blurred vision, pain and discharge.  Respiratory: Negative for sputum production, shortness of breath, wheezing and stridor.   Cardiovascular: Negative for chest pain, palpitations, orthopnea and PND.  Gastrointestinal: Negative for abdominal pain, diarrhea, nausea and vomiting.  Genitourinary: Negative for frequency and urgency.  Musculoskeletal: Negative for back pain and joint pain.  Neurological: Positive for weakness. Negative for sensory change, speech change and focal weakness.       Bilateral lower extremity weakness  Psychiatric/Behavioral: Negative for depression and hallucinations. The patient is not nervous/anxious.    Tolerating Diet:yes  Tolerating PT: SNF  DRUG ALLERGIES:   Allergies  Allergen Reactions  . Amlodipine Other (See Comments)    edema  . Povidone Iodine Rash    VITALS:  Blood pressure (!) 137/57, pulse (!) 102, temperature 97.7 F (36.5 C), temperature source Oral, resp. rate 15, height 5\' 11"  (1.803 m), weight 244 lb (110.7 kg), SpO2 95 %.  PHYSICAL EXAMINATION:   Physical Exam  GENERAL:  56 y.o.-year-old patient lying in the bed with no acute distress.  EYES: Pupils equal, round, reactive to light and accommodation. No scleral icterus. Extraocular muscles intact.  HEENT: Head atraumatic, normocephalic. Oropharynx and nasopharynx clear.  NECK:  Supple, no jugular venous distention. No thyroid enlargement, no tenderness.  LUNGS: Normal breath sounds  bilaterally, no wheezing, rales, rhonchi. No use of accessory muscles of respiration.  CARDIOVASCULAR: S1, S2 normal. No murmurs, rubs, or gallops.  ABDOMEN: Soft, nontender, nondistended. Bowel sounds present. No organomegaly or mass.  EXTREMITIES: No cyanosis, clubbing or edema b/l.    NEUROLOGIC: Cranial nerves II through XII are intact. No focal Motor or sensory deficits b/l.  Weakness++  PSYCHIATRIC:  patient is alert and oriented x 3.  SKIN: No obvious rash, lesion, or ulcer.   LABORATORY PANEL:  CBC  Recent Labs Lab 03/06/17 0504  WBC 7.6  HGB 12.5  HCT 36.9  PLT 213    Chemistries   Recent Labs Lab 03/06/17 0504 03/10/17 0449  NA 138  --   K 3.9  --   CL 107  --   CO2 25  --   GLUCOSE 134*  --   BUN 23*  --   CREATININE 0.53 0.63  CALCIUM 9.0  --    Cardiac Enzymes No results for input(s): TROPONINI in the last 168 hours. RADIOLOGY:  No results found. ASSESSMENT AND PLAN:  Lisa Williams is an 56 y.o. female with a history of MS and recent fall with wrist fracture who presents with LE weakness.  Patient reports that she is usually able to help with transfers and walk with walker.  Yesterday AM was unable to help with transfer and was unable to ambulate, even with help, using walker.  Also reports some urinary incontinence.    1.  Multiple sclerosis exacerbation.   -pt is started on 1 g IV Solu-Medrol daily for 5 days. (completed) -Neurology consultation appreciated - Physical therapy and occupational therapy evaluations--recommends rehab. Pt's insurance  denied rehab.  Patient's appeal is in process await decision of this -Continue in-house physical therapy  2.  Essential hypertension on losartan  3.  Recent wrist fracture.  Started OT.    Case discussed with Care Management/Social Worker. Management plans discussed with the patient, family and they are in agreement.  CODE STATUS: full  DVT Prophylaxis: lovenox  TOTAL TIME TAKING CARE OF  THIS PATIENT: 25 minutes.  >50% time spent on counselling and coordination of care  POSSIBLE D/C IN *1-2* DAYS, DEPENDING ON CLINICAL CONDITION.  Note: This dictation was prepared with Dragon dictation along with smaller phrase technology. Any transcriptional errors that result from this process are unintentional.  Auburn BilberryPATEL, Lisa Toops M.D on 03/10/2017 at 12:22 PM  Between 7am to 6pm - Pager - [phone]  After 6pm go to [email or web address] - Social research officer, governmentpassword EPAS [email or web address]  [facility]  Office  [phone]  CC: Primary care physician; Lauro RegulusAnderson, Marshall W, MD

## 2017-03-10 NOTE — Progress Notes (Signed)
Subjective: Patient doing well today.  Has tolerated IV Solu-Medrol.  Reports although she was unable to walk today was able to stand 6 times.  This is an improvement from prior physical therapy sessions.  Objective: Current vital signs: BP 127/61 (BP Location: Left Arm)   Pulse (!) 58   Temp 98.6 F (37 C) (Oral)   Resp 15   Ht 5\' 11"  (1.803 m)   Wt 110.7 kg (244 lb)   SpO2 100%   BMI 34.03 kg/m  Vital signs in last 24 hours: Temp:  [97.7 F (36.5 C)-98.6 F (37 C)] 98.6 F (37 C) (11/03 1412) Pulse Rate:  [58-102] 58 (11/03 1412) Resp:  [14-15] 15 (11/03 0407) BP: (127-137)/(57-61) 127/61 (11/03 1412) SpO2:  [94 %-100 %] 100 % (11/03 1412)  Intake/Output from previous day: 11/02 0701 - 11/03 0700 In: 480 [P.O.:480] Out: 1150 [Urine:1150] Intake/Output this shift: Total I/O In: 480 [P.O.:480] Out: -  Nutritional status: Diet regular Room service appropriate? Yes; Fluid consistency: Thin  Neurologic Exam: Mental Status: Alert, oriented, thought content appropriate. Speech fluent without evidence of aphasia. Able to follow 3 step commands without difficulty. Cranial Nerves: II: Discs flat bilaterally; Visual fields grossly normal, pupils equal, round, reactive to light and accommodation III,IV, VI: ptosis not present, extra-ocular motions intact bilaterally V,VII: smile symmetric, facial light touch sensation normal bilaterally VIII: hearing normal bilaterally IX,X: gag reflex present XI: bilateral shoulder shrug XII: midline tongue extension Motor: Right :Upper extremity 5/5Left: Upper extremity 5/5 Lower extremity 3-/5Lower extremity 3+/5  Lab Results: Basic Metabolic Panel:  Recent Labs Lab 03/05/17 1558 03/06/17 0504 03/10/17 0449  NA 138 138  --   K 4.4 3.9  --   CL 105 107  --   CO2 25 25  --   GLUCOSE 112* 134*  --   BUN 26* 23*  --    CREATININE 0.72 0.53 0.63  CALCIUM 9.5 9.0  --     Liver Function Tests: No results for input(s): AST, ALT, ALKPHOS, BILITOT, PROT, ALBUMIN in the last 168 hours. No results for input(s): LIPASE, AMYLASE in the last 168 hours. No results for input(s): AMMONIA in the last 168 hours.  CBC:  Recent Labs Lab 03/05/17 1558 03/06/17 0504  WBC 8.8 7.6  NEUTROABS 5.7  --   HGB 13.0 12.5  HCT 38.6 36.9  MCV 79.1* 79.6*  PLT 232 213    Cardiac Enzymes: No results for input(s): CKTOTAL, CKMB, CKMBINDEX, TROPONINI in the last 168 hours.  Lipid Panel: No results for input(s): CHOL, TRIG, HDL, CHOLHDL, VLDL, LDLCALC in the last 168 hours.  CBG:  Recent Labs Lab 03/07/17 1134 03/08/17 2101 03/09/17 0743 03/09/17 1156 03/09/17 1707  GLUCAP 112* 183* 112* 133* 99    Microbiology: Results for orders placed or performed during the hospital encounter of 01/16/17  Surgical PCR screen     Status: None   Collection Time: 01/16/17  3:00 PM  Result Value Ref Range Status   MRSA, PCR NEGATIVE NEGATIVE Final   Staphylococcus aureus NEGATIVE NEGATIVE Final    Comment: (NOTE) The Xpert SA Assay (FDA approved for NASAL specimens in patients 922 years of age and older), is one component of a comprehensive surveillance program. It is not intended to diagnose infection nor to guide or monitor treatment.     Coagulation Studies: No results for input(s): LABPROT, INR in the last 72 hours.  Imaging: No results found.  Medications:  I have reviewed the patient's current medications. Scheduled: .  baclofen  20 mg Oral TID  . calcium-vitamin D  1 tablet Oral Daily  . enoxaparin (LOVENOX) injection  40 mg Subcutaneous Q24H  . losartan  100 mg Oral Daily  . multivitamin with minerals  1 tablet Oral Daily  . oxybutynin  5 mg Oral BID    Assessment/Plan: Patient continues to make modest improvement.  Will need further therapy.  Awaiting discharge disposition. No further neurologic  intervention is recommended at this time.  If further questions arise, please call or page at that time.  Thank you for allowing neurology to participate in the care of this patient.    LOS: 5 days   Thana Farr, MD Neurology [phone] 03/10/2017  4:07 PM

## 2017-03-10 NOTE — Progress Notes (Signed)
Physical Therapy Treatment Patient Details Name: Lisa Williams MRN: [id number] DOB: 1960-05-21 Today's Date: 03/10/2017    History of Present Illness Pt is a 55 y.o. female with a known history of MS and recent fall with wrist fracture who presents with LE weakness. Per pt, MD has cleared L wrist now WBAT, brace for comfort/protection if desired when going out. Pt is now admitted for MS exaccerbation.     PT Comments    Pt feeling better today, excited to try standing/therapy.  Participated in exercises as described below.  Pt required AAROM for all exercises to complete ranges.  Stated RLE feels weaker than LLE.  AFO's donned in supine.  While pt put good effort into getting to edge of bed, she was unable to complete without mod assist x 2.  Once sitting she is steady with no LOB's.  Pt stood x 6 trials with walker and mod/max a x 2 to stand.  Pt requests not to pull her up with gait belt as it "throws off my balance" but to assist under arms.  Gait belt remained on and used once standing for safety.  She requires R knee to be blocked and to assist it into extension for standing.  During each trial, standing time and quality increased.  On 6th try, she was able to stand for 45 seconds before fatigue.  She stated her legs felt stronger today and that L wrist weakness from fracture made it difficulty to grasp walker handle.  Pt does drift to left while standing due to weakness.  Sit to stand lift was used to transfer pt to recliner at end of session as she was unable to safely transfer with walker.  Pt may benefit from trying left platform walker while standing due to left wrist weakness.     Follow Up Recommendations  CIR     Equipment Recommendations  Rolling walker with 5" wheels    Recommendations for Other Services       Precautions / Restrictions Precautions Precautions: Fall Restrictions Weight Bearing Restrictions: Yes LUE Weight Bearing: Weight bearing as  tolerated Other Position/Activity Restrictions: LUE WBAT, no brace required; bilateral LE AFOs    Mobility  Bed Mobility Overal bed mobility: Needs Assistance Bed Mobility: Supine to Sit     Supine to sit: Mod assist;HOB elevated Sit to supine: Mod assist;+2 for physical assistance   General bed mobility comments: Assist provided to advance LEs to EOB as pt peforms supine>sit with increased effort.    Transfers Overall transfer level: Needs assistance Equipment used: Rolling walker (2 wheeled) Transfers: Sit to/from Stand Sit to Stand: Max assist;+2 physical assistance;From elevated surface         General transfer comment: Assist to block R knee and promote R knee extension as pt rises into standing.  Assist provided to boost to standing (assist provided at arms as pt prefers not to have assist from gait belt).  Pt performed x6 as pt very determined, with each trial showing improvement in balance.   Pt with imbalance, specifically anteriorly/posteriorly and to left when standing.   Ambulation/Gait             General Gait Details: Unable due to pt's instability    Stairs            Wheelchair Mobility    Modified Rankin (Stroke Patients Only)       Balance Overall balance assessment: Needs assistance;History of Falls Sitting-balance support: No upper extremity supported;Feet supported Sitting balance-Leahy  Scale: Good     Standing balance support: Bilateral upper extremity supported;During functional activity Standing balance-Leahy Scale: Poor Standing balance comment: Relies on UE support for static and dynamic activities with up to mod +2 assist to remain steady standing at bedside                            Cognition Arousal/Alertness: Awake/alert Behavior During Therapy: WFL for tasks assessed/performed Overall Cognitive Status: Within Functional Limits for tasks assessed                                         Exercises General Exercises - Lower Extremity Ankle Circles/Pumps: AROM;Both;10 reps;Supine Quad Sets: Strengthening;Both;10 reps;Supine Gluteal Sets: Strengthening;Both;10 reps;Supine Short Arc Quad: Strengthening;Both;10 reps;Supine Heel Slides: Strengthening;Both;10 reps;Supine Hip ABduction/ADduction: Strengthening;Both;10 reps;Supine Straight Leg Raises: Strengthening;Both;10 reps;Supine    General Comments        Pertinent Vitals/Pain Pain Assessment: 0-10 Faces Pain Scale: Hurts a little bit Pain Location: L wrist in WBing Pain Descriptors / Indicators: Discomfort Pain Intervention(s): Monitored during session    Home Living                      Prior Function            PT Goals (current goals can now be found in the care plan section) Progress towards PT goals: Progressing toward goals    Frequency    7X/week      PT Plan Current plan remains appropriate    Co-evaluation              AM-PAC PT "6 Clicks" Daily Activity  Outcome Measure  Difficulty turning over in bed (including adjusting bedclothes, sheets and blankets)?: Unable Difficulty moving from lying on back to sitting on the side of the bed? : Unable Difficulty sitting down on and standing up from a chair with arms (e.g., wheelchair, bedside commode, etc,.)?: Unable Help needed moving to and from a bed to chair (including a wheelchair)?: A Lot Help needed walking in hospital room?: A Lot Help needed climbing 3-5 steps with a railing? : Total 6 Click Score: 8    End of Session Equipment Utilized During Treatment: Gait belt Activity Tolerance: Patient tolerated treatment well Patient left: in chair;with chair alarm set;with call bell/phone within reach;with nursing/sitter in room         Time: 1130-1200 PT Time Calculation (min) (ACUTE ONLY): 30 min  Charges:  $Therapeutic Exercise: 8-22 mins $Therapeutic Activity: 8-22 mins                    G Codes:      Danielle DessSarah  Sylis Ketchum, PTA 03/10/17, 12:14 PM

## 2017-03-11 ENCOUNTER — Other Ambulatory Visit: Payer: Self-pay

## 2017-03-11 NOTE — Progress Notes (Signed)
[facility] - [place] at [facility]   PATIENT NAME: Lisa Williams    MR#:  [id number]  DATE OF BIRTH:  [birth year]  SUBJECTIVE:   Some improvement in strenght has not gotten up today REVIEW OF SYSTEMS:   Review of Systems  Constitutional: Negative for chills, fever and weight loss.  HENT: Negative for ear discharge, ear pain and nosebleeds.   Eyes: Negative for blurred vision, pain and discharge.  Respiratory: Negative for sputum production, shortness of breath, wheezing and stridor.   Cardiovascular: Negative for chest pain, palpitations, orthopnea and PND.  Gastrointestinal: Negative for abdominal pain, diarrhea, nausea and vomiting.  Genitourinary: Negative for frequency and urgency.  Musculoskeletal: Negative for back pain and joint pain.  Neurological: Positive for weakness. Negative for sensory change, speech change and focal weakness.       Bilateral lower extremity weakness  Psychiatric/Behavioral: Negative for depression and hallucinations. The patient is not nervous/anxious.    Tolerating Diet:yes  Tolerating PT: SNF  DRUG ALLERGIES:   Allergies  Allergen Reactions  . Amlodipine Other (See Comments)    edema  . Povidone Iodine Rash    VITALS:  Blood pressure (!) 107/44, pulse (!) 55, temperature 97.8 F (36.6 C), temperature source Oral, resp. rate 14, height 5\' 11"  (1.803 m), weight 244 lb (110.7 kg), SpO2 97 %.  PHYSICAL EXAMINATION:   Physical Exam  GENERAL:  56 y.o.-year-old patient lying in the bed with no acute distress.  EYES: Pupils equal, round, reactive to light and accommodation. No scleral icterus. Extraocular muscles intact.  HEENT: Head atraumatic, normocephalic. Oropharynx and nasopharynx clear.  NECK:  Supple, no jugular venous distention. No thyroid enlargement, no tenderness.  LUNGS: Normal breath sounds bilaterally, no wheezing, rales, rhonchi. No use of accessory muscles of respiration.  CARDIOVASCULAR: S1, S2  normal. No murmurs, rubs, or gallops.  ABDOMEN: Soft, nontender, nondistended. Bowel sounds present. No organomegaly or mass.  EXTREMITIES: No cyanosis, clubbing or edema b/l.    NEUROLOGIC: Cranial nerves II through XII are intact. No focal Motor or sensory deficits b/l.  Weakness++  PSYCHIATRIC:  patient is alert and oriented x 3.  SKIN: No obvious rash, lesion, or ulcer.   LABORATORY PANEL:  CBC Recent Labs  Lab 03/06/17 0504  WBC 7.6  HGB 12.5  HCT 36.9  PLT 213    Chemistries  Recent Labs  Lab 03/06/17 0504 03/10/17 0449  NA 138  --   K 3.9  --   CL 107  --   CO2 25  --   GLUCOSE 134*  --   BUN 23*  --   CREATININE 0.53 0.63  CALCIUM 9.0  --    Cardiac Enzymes No results for input(s): TROPONINI in the last 168 hours. RADIOLOGY:  No results found. ASSESSMENT AND PLAN:  Lisa Williams is an 56 y.o. female with a history of MS and recent fall with wrist fracture who presents with LE weakness.  Patient reports that she is usually able to help with transfers and walk with walker.  Yesterday AM was unable to help with transfer and was unable to ambulate, even with help, using walker.  Also reports some urinary incontinence.    1.  Multiple sclerosis exacerbation.   -treated with  1 g IV Solu-Medrol daily for 5 days. (completed) -Neurology consultation appreciated - Physical therapy and occupational therapy evaluations--recommends rehab. Pt's insurance denied rehab.  Patient's appeal is in process await decision of this -Continue in-house physical therapy  2.  Essential hypertension on losartan  3.  Recent wrist fracture.  Started OT.    Case discussed with Care Management/Social Worker. Management plans discussed with the patient, family and they are in agreement.  CODE STATUS: full  DVT Prophylaxis: lovenox  TOTAL TIME TAKING CARE OF THIS PATIENT: 25 minutes.  >50% time spent on counselling and coordination of care  POSSIBLE D/C IN *1-2* DAYS,  DEPENDING ON CLINICAL CONDITION.  Note: This dictation was prepared with Dragon dictation along with smaller phrase technology. Any transcriptional errors that result from this process are unintentional.  Auburn BilberryPATEL, Raychell Holcomb M.D on 03/11/2017 at 12:35 PM  Between 7am to 6pm - Pager - [phone]  After 6pm go to [email or web address] - Social research officer, governmentpassword EPAS [facility]  [facility]  Office  [phone]  CC: Primary care physician; Lauro RegulusAnderson, Marshall W, MD

## 2017-03-11 NOTE — Care Management Note (Signed)
Case Management Note  Patient Details  Name: Lisa Williams MRN: [id number] Date of Birth: 1960-11-11  Subjective/Objective:                 Physical therapy has recommend CIR 11/3 and 11/4.Marland Kitchen  Patient in process of insurance appeal due to denial for SNF on 03/09/2017.  OT last treatment 03/09/2017 and recommended  SNF  Action/Plan:CM will contact CIR representative to discuss recommendations and criteria    Expected Discharge Date:                  Expected Discharge Plan:     In-House Referral:     Discharge planning Services     Post Acute Care Choice:    Choice offered to:     DME Arranged:    DME Agency:     HH Arranged:    HH Agency:     Status of Service:     If discussed at [employer], dates discussed:    Additional Comments:  Eber Hong, RN 03/11/2017, 5:54 PM

## 2017-03-11 NOTE — Progress Notes (Signed)
Physical Therapy Treatment Patient Details Name: Lisa SaleJacqueline Renee Schaper MRN: [id number] DOB: [birth year] Today's Date: 03/11/2017    History of Present Illness Pt is a 56 y.o. female with a known history of MS and recent fall with wrist fracture who presents with LE weakness. Per pt, MD has cleared L wrist now WBAT, brace for comfort/protection if desired when going out. Pt is now admitted for MS exaccerbation.     PT Comments    Patient remains extremely motivated and puts forth very good effort with exercise and mobility tasks. Patient's RLE remains weaker than her LLE with exercise and mobility tasks, requiring blocking with sit to stand activities and assistance with therapeutic exercises. Patient did well with sit to stand activities, but does fatigue and feels that her LUE needs to be stronger to help with mobility and gait tasks. She reports that she relies heavily on her UE's and her LUE is currently weak, as a result of her wrist fracture. Patient did report some discomfort in her wrist at the conclusion of PT, but did not feel the need to try the platform attachment, as she feels that it may negatively impact her balance. Patient does well with donning her AFO's on her LLE, but did need some help today, as her RLE felt very numb. Patient may continue to benefit from formal PT to address strength, mobility and gait tasks [facility] at [facility].   Follow Up Recommendations  CIR     Equipment Recommendations  Rolling walker with 5" wheels    Recommendations for Other Services       Precautions / Restrictions Precautions Precautions: Fall Required Braces or Orthoses: (Bilateral AFO's) Restrictions LUE Weight Bearing: (LUE WBAT)    Mobility  Bed Mobility Overal bed mobility: Needs Assistance Bed Mobility: Supine to Sit     Supine to sit: Mod assist;HOB elevated     General bed mobility comments: Assistance to move LE's to edge of bed (R>L) while patient moves upper body.    Transfers Overall transfer level: Needs assistance Equipment used: Rolling walker (2 wheeled)   Sit to Stand: Max assist;+2 physical assistance;From elevated surface         General transfer comment: RLE blocked with sit to stand and assistance provided under her arms, as the patient feels that her balance is altered if the gait belt is pulled on. Patient performed 3 sit to stands; stand pivot transfer was set up; however, patient wants her LUE to be stronger beforehand. Nursing was called to help patient to the chair with the Sabena lift, which has been used at prior treatments.   Ambulation/Gait             General Gait Details: Unable due to LE weakness and fatigue.    Stairs            Wheelchair Mobility    Modified Rankin (Stroke Patients Only)       Balance Overall balance assessment: Needs assistance Sitting-balance support: No upper extremity supported Sitting balance-Leahy Scale: Good     Standing balance support: Bilateral upper extremity supported Standing balance-Leahy Scale: Poor Standing balance comment: Pt. does tend to move anteriorly upon standing and does need assistance to remain balanced.                             Cognition Arousal/Alertness: Awake/alert Behavior During Therapy: WFL for tasks assessed/performed Overall Cognitive Status: Within Functional Limits for tasks assessed  Exercises General Exercises - Lower Extremity Ankle Circles/Pumps: AROM;Both;10 reps Quad Sets: Strengthening;Both;10 reps Gluteal Sets: Strengthening;Both;10 reps Short Arc Quad: Strengthening;Both;10 reps Heel Slides: Strengthening;Both;5 reps Hip ABduction/ADduction: Strengthening;Both;10 reps Straight Leg Raises: Strengthening;Both;5 reps    General Comments        Pertinent Vitals/Pain Pain Assessment: No/denies pain    Home Living                      Prior  Function            PT Goals (current goals can now be found in the care plan section) Acute Rehab PT Goals Patient Stated Goal: To be able to go to rehab.     Frequency    7X/week      PT Plan Current plan remains appropriate    Co-evaluation              AM-PAC PT "6 Clicks" Daily Activity  Outcome Measure  Difficulty turning over in bed (including adjusting bedclothes, sheets and blankets)?: Unable Difficulty moving from lying on back to sitting on the side of the bed? : A Lot Difficulty sitting down on and standing up from a chair with arms (e.g., wheelchair, bedside commode, etc,.)?: A Lot Help needed moving to and from a bed to chair (including a wheelchair)?: Total Help needed walking in hospital room?: Total Help needed climbing 3-5 steps with a railing? : Total 6 Click Score: 8    End of Session Equipment Utilized During Treatment: Gait belt Activity Tolerance: Patient tolerated treatment well Patient left: in bed;with nursing/sitter in room         Time: 1445-1520 PT Time Calculation (min) (ACUTE ONLY): 35 min  Charges:  $Therapeutic Exercise: 8-22 mins $Therapeutic Activity: 8-22 mins                    G CodesWilhemina Cash, PT 03/11/2017, 4:29 PM

## 2017-03-12 LAB — CREATININE, SERUM
Creatinine, Ser: 0.67 mg/dL (ref 0.44–1.00)
GFR calc Af Amer: >60 mL/min (ref >60)

## 2017-03-12 MED ORDER — HYDROCODONE-ACETAMINOPHEN 5-325 MG PO TABS: 1.0000 | ORAL_TABLET | Freq: Four times a day (QID) | ORAL | Status: DC | PRN

## 2017-03-12 NOTE — Care Management Important Message (Signed)
Important Message  Patient Details  Name: Lisa Williams MRN: [id number] Date of Birth: 09/21/1960   Medicare Important Message Given:  Yes    Collie Siad, RN 03/12/2017, 9:35 AM

## 2017-03-12 NOTE — Care Management (Signed)
RNCM reached out again to [facility] by phone [phone]; message left.

## 2017-03-12 NOTE — Progress Notes (Signed)
Occupational Therapy Treatment Patient Details Name: Lisa Williams MRN: [id number] DOB: 11-29-1960 Today's Date: 03/12/2017    History of present illness Pt is a 56 y.o. female with a known history of MS and recent fall with wrist fracture who presents with LE weakness. Per pt, MD has cleared L wrist now WBAT, brace for comfort/protection if desired when going out. Pt is now admitted for MS exaccerbation.    OT comments  Pt seen for OT treatment this date focused on education/training and strengthening exercises to support functional use of L wrist for self care tasks and functional mobility. PT very eager and motivated to participate. Pt educated in wrist exercises using t-band for wrist extension, flexion, ulnar/radial deviation, and pronation/supination, x5 each. OT facilitated problem solving to support set up and modifying positioning to maximize benefit from exercises and ways to grade the exercises to provide greater resistance. Handout provided. Pt verbalized understanding and was able to return demo proper technique. Educated in risks of overdoing it to minimize pain. Pt reported mild soreness in L wrist following exercise. Reviewed gentle stretching pt can perform and pt demonstrated understanding. Will continue to progress. Recommendation updated to inpatient rehab to maximize pt's potential for return to PLOF.   Follow Up Recommendations  CIR    Equipment Recommendations       Recommendations for Other Services      Precautions / Restrictions Precautions Precautions: Fall Required Braces or Orthoses: Other Brace/Splint Other Brace/Splint: bilateral AFOs Restrictions Weight Bearing Restrictions: Yes LUE Weight Bearing: Weight bearing as tolerated       Mobility Bed Mobility                  Transfers                      Balance                                           ADL either performed or assessed with clinical  judgement   ADL                                               Vision Baseline Vision/History: Wears glasses Wears Glasses: At all times Patient Visual Report: No change from baseline     Perception     Praxis      Cognition Arousal/Alertness: Awake/alert Behavior During Therapy: WFL for tasks assessed/performed Overall Cognitive Status: Within Functional Limits for tasks assessed                                          Exercises Other Exercises Other Exercises: Pt educated in wrist exercises using t-band targeting wrist extension, flexion, ulnar/radial deviation, and pronation/supination. OT facilitated problem solving to support set up and modifying positioning to maximize benefit from exercises. Handout provided. Pt able to return demo proper technique.   Shoulder Instructions       General Comments      Pertinent Vitals/ Pain       Pain Assessment: No/denies pain(noted mild soreness after exercises but no pain) Pain Intervention(s): Limited activity within patient's tolerance;Monitored during session  Home  Living                                          Prior Functioning/Environment              Frequency  Min 3X/week        Progress Toward Goals  OT Goals(current goals can now be found in the care plan section)  Progress towards OT goals: Progressing toward goals  Acute Rehab OT Goals Patient Stated Goal: To be able to go to rehab.  OT Goal Formulation: With patient/family Time For Goal Achievement: 03/20/17 Potential to Achieve Goals: Good  Plan Discharge plan needs to be updated;Frequency needs to be updated    Co-evaluation                 AM-PAC PT "6 Clicks" Daily Activity     Outcome Measure   Help from another person eating meals?: None Help from another person taking care of personal grooming?: None Help from another person toileting, which includes using toliet, bedpan,  or urinal?: A Lot Help from another person bathing (including washing, rinsing, drying)?: A Lot Help from another person to put on and taking off regular upper body clothing?: A Little Help from another person to put on and taking off regular lower body clothing?: A Lot 6 Click Score: 17    End of Session    OT Visit Diagnosis: Other abnormalities of gait and mobility (R26.89);Muscle weakness (generalized) (M62.81)   Activity Tolerance Patient tolerated treatment well   Patient Left in bed;with call bell/phone within reach;with bed alarm set   Nurse Communication          Time: 7096-2836 OT Time Calculation (min): 33 min  Charges: OT General Charges $OT Visit: 1 Visit OT Treatments $Therapeutic Exercise: 23-37 mins  Richrd Prime, MPH, MS, OTR/L ascom [phone] 03/12/17, 11:15 AM

## 2017-03-12 NOTE — Progress Notes (Signed)
PT Cancellation Note  Patient Details Name: Delsa SaleJacqueline Renee Test MRN: [id number] DOB: 08/03/1960   Cancelled Treatment:    Reason Eval/Treat Not Completed: Other (comment)(Patient on phone with insurance company upon entry into room.  Visibly upset, frustrated with lack of access to appropriate and recommended services.  Provided emotional support, encouragement during this time.  Problem-solved alternatives and reviewed things that home health could offer. Patient interested in pursuing outpatient physical therapy for additional challenge/progression; however, [facility] to learn how to safely/indep manage home environment with her current functional deficits (which, again, are quite different than her baseline).   Patient to continue to discuss with family and care team to establish best plan with resources offered.  Requests therapist return at later time time for re-attempt at session; emotionally unable to appropriately participate with session at this time.  Will continue efforts at later time/date as appropriate.)   Faren Florence H. Manson PasseyBrown, PT, DPT, NCS 03/12/17, 2:21 PM [phone]

## 2017-03-12 NOTE — Clinical Social Work Note (Signed)
CSW received phone call from insurance company they are still denying patient for SNF placement.  Insurance company forwarding the appeal to the third party who has to respond within 72 hours with their decision.  Case forwarded on Saturday the 3rd of November.  Lisa Williams. Lisa Williams, MSW, Theresia Majors [phone]  03/12/2017 9:38 AM

## 2017-03-12 NOTE — Progress Notes (Signed)
Pt denies any pain so far today.insurance  Denied pt for rehab again. Pt was  Upset about this earlier but  Settled down after a while. Able to move legs lower and  bil  Feet some.

## 2017-03-12 NOTE — Care Management (Signed)
Email sent to Russella Dar with [facility] to check status of CIR referral. RNCM will follow.

## 2017-03-12 NOTE — Care Management Note (Addendum)
Case Management Note  Patient Details  Name: Lisa Williams MRN: [id number] Date of Birth: 1961-02-23  Subjective/Objective:                   Patient lives with elderly parents and a sister that is there during the night. She has a walker that she was able to walk with prior to wrist fracture. She states she is independent with ADLs; sleeps in regular bed. She was driving up until Sept. 11 at time of fall. She has had home health services in the past - [facility]. PCP is Dr. Dareen Piano and neuro Dr. [name]. Patient is eager to get the rehabilitation needed to get back to her baseline prior to this wrist injury. She agrees with CIR and hopes that her insurance will pay for it.  She states she was at [facility] place for approximately 6 weeks non-weightbearing to left wrist.   Action/Plan: RNCM team to follow. This RNCM spoke with Russella Dar and she is not pursuing CIR as patient's insurance was denying SNF- she is certain it will not approve CIR.  Patient to continue PT/OT here. I would encourage out of bed frequently/bed exercises when in bed.  RNCM can arrange PT/OT in the home setting. I spoke with patient and of course she is tearfully disappointed. She is now weight-bearing as tolerated to left wrist whereas when she was in rehab at [facility]- she was non-weightbearing along with decompromised from MS and unable to use walker with her body size. Home health list provided. She would like to use [facility] if she must return to [employer] sent to Tim with [facility]. She states her insurance "just wants her to go home and die/waste away".  I offered a hospital bed but she became so upset she asked that I leave her alone which I did. I have updated bedside RN.     Expected Discharge Date:                  Expected Discharge Plan:     In-House Referral:     Discharge planning Services  CM Consult  Post Acute Care Choice:    Choice offered to:  Patient  DME Arranged:    DME  Agency:     HH Arranged:    HH Agency:     Status of Service:  In process, will continue to follow  If discussed at Long Length of Stay Meetings, dates discussed:    Additional Comments:  Collie Siad, RN 03/12/2017, 12:45 PM

## 2017-03-12 NOTE — Clinical Social Work Note (Signed)
Covering CSW for today has noted that the insurance company continues to deny patient for SNF placement.  York Spaniel MSW,LCSW [phone]

## 2017-03-12 NOTE — Progress Notes (Signed)
[facility] - [place] at [facility]   PATIENT NAME: Lisa Williams    MR#:  [id number]  DATE OF BIRTH:  05-Apr-1961  SUBJECTIVE:   Some improvement in strength Pt able to stand up but not walk much. Working and putting effort with PT REVIEW OF SYSTEMS:   Review of Systems  Constitutional: Negative for chills, fever and weight loss.  HENT: Negative for ear discharge, ear pain and nosebleeds.   Eyes: Negative for blurred vision, pain and discharge.  Respiratory: Negative for sputum production, shortness of breath, wheezing and stridor.   Cardiovascular: Negative for chest pain, palpitations, orthopnea and PND.  Gastrointestinal: Negative for abdominal pain, diarrhea, nausea and vomiting.  Genitourinary: Negative for frequency and urgency.  Musculoskeletal: Negative for back pain and joint pain.  Neurological: Positive for weakness. Negative for sensory change, speech change and focal weakness.       Bilateral lower extremity weakness  Psychiatric/Behavioral: Negative for depression and hallucinations. The patient is not nervous/anxious.    Tolerating Diet:yes  Tolerating PT: SNF  DRUG ALLERGIES:   Allergies  Allergen Reactions  . Amlodipine Other (See Comments)    edema  . Povidone Iodine Rash    VITALS:  Blood pressure (!) 108/51, pulse (!) 58, temperature 98.5 F (36.9 C), temperature source Oral, resp. rate 20, height 5\' 11"  (1.803 m), weight 110.7 kg (244 lb), SpO2 100 %.  PHYSICAL EXAMINATION:   Physical Exam  GENERAL:  56 y.o.-year-old patient lying in the bed with no acute distress.  EYES: Pupils equal, round, reactive to light and accommodation. No scleral icterus. Extraocular muscles intact.  HEENT: Head atraumatic, normocephalic. Oropharynx and nasopharynx clear.  NECK:  Supple, no jugular venous distention. No thyroid enlargement, no tenderness.  LUNGS: Normal breath sounds bilaterally, no wheezing, rales, rhonchi. No use of  accessory muscles of respiration.  CARDIOVASCULAR: S1, S2 normal. No murmurs, rubs, or gallops.  ABDOMEN: Soft, nontender, nondistended. Bowel sounds present. No organomegaly or mass.  EXTREMITIES: No cyanosis, clubbing or edema b/l.    NEUROLOGIC: Cranial nerves II through XII are intact. No focal Motor or sensory deficits b/l.  Weakness++  PSYCHIATRIC:  patient is alert and oriented x 3.  SKIN: No obvious rash, lesion, or ulcer.   LABORATORY PANEL:  CBC Recent Labs  Lab 03/06/17 0504  WBC 7.6  HGB 12.5  HCT 36.9  PLT 213    Chemistries  Recent Labs  Lab 03/06/17 0504  03/12/17 0503  NA 138  --   --   K 3.9  --   --   CL 107  --   --   CO2 25  --   --   GLUCOSE 134*  --   --   BUN 23*  --   --   CREATININE 0.53   < > 0.67  CALCIUM 9.0  --   --    < > = values in this interval not displayed.   Cardiac Enzymes No results for input(s): TROPONINI in the last 168 hours. RADIOLOGY:  No results found. ASSESSMENT AND PLAN:  Lisa Williams is an 56 y.o. female with a history of MS and recent fall with wrist fracture who presents with LE weakness.  Patient reports that she is usually able to help with transfers and walk with walker.  Yesterday AM was unable to help with transfer and was unable to ambulate, even with help, using walker.  Also reports some urinary incontinence.    1.  Multiple  sclerosis exacerbation.   -treated with  1 g IV Solu-Medrol daily for 5 days. (completed) -Neurology consultation appreciated - Physical therapy and occupational therapy evaluations--recommends rehab. Pt's insurance denied rehab.  Patient's appeal is in process await decision of this -Continue in-house physical therapy.   2.  Essential hypertension on losartan  3.  Recent wrist fracture.  Started OT.  awaiting appeal results. Per pt Health team [employer] called her on Saturday after reviewing notes--Denied. Now waiting for outsourced company to give their verdict!!  Case  discussed with Care Management/Social Worker. Management plans discussed with the patient and  in agreement.  CODE STATUS: full  DVT Prophylaxis: lovenox  TOTAL TIME TAKING CARE OF THIS PATIENT: 25 minutes.  >50% time spent on counselling and coordination of care  D/c pending insurance approval  Note: This dictation was prepared with Dragon dictation along with smaller phrase technology. Any transcriptional errors that result from this process are unintentional.  Lisa Williams M.D on 03/12/2017 at 7:55 AM  Between 7am to 6pm - Pager - [phone]  After 6pm go to [email or web address] - Social research officer, governmentpassword EPAS [facility]  [facility] [place] [facility]  Office  [phone]  CC: Primary care physician; Lauro RegulusAnderson, Lisa W, MD

## 2017-03-12 NOTE — Progress Notes (Signed)
Subjective: Patient continued to work with therapy.  No new neurological symptoms.    Objective: Current vital signs: BP (!) 132/48 (BP Location: Right Arm)   Pulse 62   Temp 98.2 F (36.8 C) (Oral)   Resp 20   Ht 5\' 11"  (1.803 m)   Wt 110.7 kg (244 lb)   SpO2 100%   BMI 34.03 kg/m  Vital signs in last 24 hours: Temp:  [98.2 F (36.8 C)-99.1 F (37.3 C)] 98.2 F (36.8 C) (11/05 0840) Pulse Rate:  [58-95] 62 (11/05 0840) Resp:  [20] 20 (11/05 0840) BP: (105-132)/(48-67) 132/48 (11/05 0840) SpO2:  [98 %-100 %] 100 % (11/05 0840)  Intake/Output from previous day: 11/04 0701 - 11/05 0700 In: 360 [P.O.:360] Out: 1300 [Urine:1300] Intake/Output this shift: Total I/O In: 120 [P.O.:120] Out: -  Nutritional status: Diet regular Room service appropriate? Yes; Fluid consistency: Thin  Neurologic Exam: Mental Status: Alert, oriented, thought content appropriate. Speech fluent without evidence of aphasia. Able to follow 3 step commands without difficulty. Cranial Nerves: II: Discs flat bilaterally; Visual fields grossly normal, pupils equal, round, reactive to light and accommodation III,IV, VI: ptosis not present, extra-ocular motions intact bilaterally V,VII: smile symmetric, facial light touch sensation normal bilaterally VIII: hearing normal bilaterally IX,X: gag reflex present XI: bilateral shoulder shrug XII: midline tongue extension Motor: Right :Upper extremity 5/5Left: Upper extremity 5/5 Lower extremity 3-/5Lower extremity 3+/5    Lab Results: Basic Metabolic Panel: Recent Labs  Lab 03/05/17 1558 03/06/17 0504 03/10/17 0449 03/12/17 0503  NA 138 138  --   --   K 4.4 3.9  --   --   CL 105 107  --   --   CO2 25 25  --   --   GLUCOSE 112* 134*  --   --   BUN 26* 23*  --   --   CREATININE 0.72 0.53 0.63 0.67  CALCIUM 9.5 9.0  --   --      Liver Function Tests: No results for input(s): AST, ALT, ALKPHOS, BILITOT, PROT, ALBUMIN in the last 168 hours. No results for input(s): LIPASE, AMYLASE in the last 168 hours. No results for input(s): AMMONIA in the last 168 hours.  CBC: Recent Labs  Lab 03/05/17 1558 03/06/17 0504  WBC 8.8 7.6  NEUTROABS 5.7  --   HGB 13.0 12.5  HCT 38.6 36.9  MCV 79.1* 79.6*  PLT 232 213    Cardiac Enzymes: No results for input(s): CKTOTAL, CKMB, CKMBINDEX, TROPONINI in the last 168 hours.  Lipid Panel: No results for input(s): CHOL, TRIG, HDL, CHOLHDL, VLDL, LDLCALC in the last 168 hours.  CBG: Recent Labs  Lab 03/07/17 1134 03/08/17 2101 03/09/17 0743 03/09/17 1156 03/09/17 1707  GLUCAP 112* 183* 112* 133* 99    Microbiology: Results for orders placed or performed during the hospital encounter of 01/16/17  Surgical PCR screen     Status: None   Collection Time: 01/16/17  3:00 PM  Result Value Ref Range Status   MRSA, PCR NEGATIVE NEGATIVE Final   Staphylococcus aureus NEGATIVE NEGATIVE Final    Comment: (NOTE) The Xpert SA Assay (FDA approved for NASAL specimens in patients 55 years of age and older), is one component of a comprehensive surveillance program. It is not intended to diagnose infection nor to guide or monitor treatment.     Coagulation Studies: No results for input(s): LABPROT, INR in the last 72 hours.  Imaging: No results found.  Medications:  I have reviewed  the patient's current medications. Scheduled: . baclofen  20 mg Oral TID  . calcium-vitamin D  1 tablet Oral Daily  . enoxaparin (LOVENOX) injection  40 mg Subcutaneous Q24H  . losartan  100 mg Oral Daily  . multivitamin with minerals  1 tablet Oral Daily  . oxybutynin  5 mg Oral BID    Assessment/Plan: Awaiting discharge disposition.  S/p 5 days IV Solumedrol.     LOS: 7 days   Thana Farr, MD Neurology [phone] 03/12/2017  1:03 PM

## 2017-03-13 NOTE — Care Management (Signed)
Discharge to home today per Dr. Allena KatzPatel. Equipment for home has been ordered.  Will need transportation to sister's home. Sister is Merchant navy officerhelia Williams Address: [address] [place] [id number] Lisa GreetBrenda S Daneesha Quinteros RN MSN CCM Care Management [phone]

## 2017-03-13 NOTE — Care Management (Addendum)
Per PT recommendation/sent to [facility] with [facility] to help with arrangements: In order to make that 'semi successful' for her, she will need a hospital bed, extra-wide DABSC, manual WC with removable armrests (20x 20), hoyer lift; and she could benefit from any/all home services we can send her way (HHPT, HHOT, HH aide, HHRN and CSW).   If patient cannot sit in chair and needs EMS to home the bed may need to be delivered prior to discharge. Covering RNCM will follow. I spoke with patient and she will be going to her sister address (that she will provide for EMS) at discharge- her sister is at work and will need to unlock house for EMS to get patient in.Furniture will also need to be re-arranged for hospital bed.  Tim/Jerry with [facility] notified of late discharge to sister's today.

## 2017-03-13 NOTE — Care Management (Signed)
RNCM notified that patient already has wheelchair however her walker is no good- she is requesting another walker which has been confirmed with patient.  She did mention that her transfer bench was broken and that she will need a new one.  Orders requested. Wheelchair order cancelled.

## 2017-03-13 NOTE — Progress Notes (Signed)
Pt now says no one is at home to received the hospital bed and so does not want to be discharged today. She is MEDICALLY STABLE for Discharge from my standpoint

## 2017-03-13 NOTE — Discharge Summary (Signed)
[facility] - Lisa Williams at [facility]   PATIENT NAME: Lisa Williams    MR#:  [id number]  DATE OF BIRTH:  1960-10-07  DATE OF ADMISSION:  03/05/2017 ADMITTING PHYSICIAN: Alford Highland, MD  DATE OF DISCHARGE: 03/13/2017  PRIMARY CARE PHYSICIAN: Lauro Regulus, MD    ADMISSION DIAGNOSIS:  Multiple sclerosis exacerbation (HCC) [G35]  DISCHARGE DIAGNOSIS:  Bilateral LE weakness due to MS flare  SECONDARY DIAGNOSIS:   Past Medical History:  Diagnosis Date  . Abdominal pain, right upper quadrant   . Back pain   . Calculus of kidney 12/09/2013  . Chronic back pain    unspecified  . Chronic left shoulder pain 07/19/2015  . Functional disorder of bladder    other  . Galactorrhea 11/26/2014   Chronic   . Hereditary and idiopathic neuropathy 08/19/2013  . HPV test positive   . Hypercholesteremia 08/19/2013  . Incomplete bladder emptying   . Microscopic hematuria   . MS (multiple sclerosis) (HCC)   . Muscle spasticity 05/21/2014  . Nonspecific findings on examination of urine    other  . Osteopenia   . Status post laparoscopic supracervical hysterectomy 11/26/2014  . Tobacco user 11/26/2014  . Wrist fracture     HOSPITAL COURSE:   Lisa Planer [name] an 56 y.o.femalewith a history of MS and recent fall with wrist fracture who presents with LE weakness. Patient reports that she is usually able to help with transfers and walk with walker. Yesterday AM was unable to help with transfer and was unable to ambulate, even with help, using walker. Also reports some urinary incontinence.   1. Multiple sclerosis exacerbation.  -treated with  1 g IV Solu-Medrol daily for 5 days. (completed) -Neurology consultation appreciated -Physical therapy and occupational therapy evaluations--recommends rehab. Pt's insurance denied rehab x2  -Continue in-house physical therapy.   2. Essential hypertension on losartan  3. Recent wrist fracture.  Started OT.  PT was denied rehab by outsourced company as well!! Only option left to d/c Home HHPT CM to help with hospital bed and transportation.  CONSULTS OBTAINED:  Treatment Team:  Kym Groom, MD Thana Farr, MD Auburn Bilberry, MD Enedina Finner, MD  DRUG ALLERGIES:   Allergies  Allergen Reactions  . Amlodipine Other (See Comments)    edema  . Povidone Iodine Rash    DISCHARGE MEDICATIONS:   Current Discharge Medication List    CONTINUE these medications which have NOT CHANGED   Details  acetaminophen (TYLENOL) 325 MG tablet Take 650 mg by mouth every 8 (eight) hours as needed. Do not exceed 3000 mg in 24 hrs    baclofen (LIORESAL) 20 MG tablet Take 20 mg by mouth every 8 (eight) hours as needed for muscle spasms.     Calcium Carb-Cholecalciferol (CALCIUM 600+D) 600-800 MG-UNIT TABS Take 1 tablet by mouth daily.    docusate sodium (COLACE) 100 MG capsule Take 100 mg by mouth daily as needed for mild constipation.    HYDROcodone-acetaminophen (NORCO) 5-325 MG tablet Take 1 tablet by mouth every 6 (six) hours as needed for moderate pain. Qty: 120 tablet, Refills: 0    losartan (COZAAR) 100 MG tablet Take 100 mg by mouth daily.     meloxicam (MOBIC) 15 MG tablet Take 1 tablet (15 mg total) by mouth 2 (two) times daily as needed for pain. Refills: 2    metoCLOPramide (REGLAN) 10 MG tablet Take 10 mg by mouth every 8 (eight) hours as needed for nausea. If Zofran not effective  Multiple Vitamin (MULTIVITAMIN) tablet Take 1 tablet by mouth daily.    oxybutynin (DITROPAN) 5 MG tablet Take 1 tablet (5 mg total) by mouth 2 (two) times daily.    polyethylene glycol (MIRALAX / GLYCOLAX) packet Take 17 g by mouth every other day as needed for mild constipation.     tiZANidine (ZANAFLEX) 4 MG tablet Take 4 mg by mouth every 6 (six) hours as needed for muscle spasms.     RITUXIMAB IV Pt uses every 6 months due October 2018        If you experience  worsening of your admission symptoms, develop shortness of breath, life threatening emergency, suicidal or homicidal thoughts you must seek medical attention immediately by calling 911 or calling your MD immediately  if symptoms less severe.  You Must read complete instructions/literature along with all the possible adverse reactions/side effects for all the Medicines you take and that have been prescribed to you. Take any new Medicines after you have completely understood and accept all the possible adverse reactions/side effects.   Please note  You were cared for by a hospitalist during your hospital stay. If you have any questions about your discharge medications or the care you received while you were in the hospital after you are discharged, you can call the unit and asked to speak with the hospitalist on call if the hospitalist that took care of you is not available. Once you are discharged, your primary care physician will handle any further medical issues. Please note that NO REFILLS for any discharge medications will be authorized once you are discharged, as it is imperative that you return to your primary care physician (or establish a relationship with a primary care physician if you do not have one) for your aftercare needs so that they can reassess your need for medications and monitor your lab values. Today   SUBJECTIVE   Frustrated with being denied for Rehab  VITAL SIGNS:  Blood pressure (!) 142/66, pulse 78, temperature 98.5 F (36.9 C), temperature source Oral, resp. rate 20, height 5\' 11"  (1.803 m), weight 110.7 kg (244 lb), SpO2 100 %.  I/O:    Intake/Output Summary (Last 24 hours) at 03/13/2017 0738 Last data filed at 03/12/2017 1047 Gross per 24 hour  Intake 120 ml  Output -  Net 120 ml    PHYSICAL EXAMINATION:  GENERAL:  56 y.o.-year-old patient lying in the bed with no acute distress. obese EYES: Pupils equal, round, reactive to light and accommodation. No scleral  icterus. Extraocular muscles intact.  HEENT: Head atraumatic, normocephalic. Oropharynx and nasopharynx clear.  NECK:  Supple, no jugular venous distention. No thyroid enlargement, no tenderness.  LUNGS: Normal breath sounds bilaterally, no wheezing, rales,rhonchi or crepitation. No use of accessory muscles of respiration.  CARDIOVASCULAR: S1, S2 normal. No murmurs, rubs, or gallops.  ABDOMEN: Soft, non-tender, non-distended. Bowel sounds present. No organomegaly or mass.  EXTREMITIES: No pedal edema, cyanosis, or clubbing.  NEUROLOGIC: Cranial nerves II through XII are intact. Bilateral LE weakness PSYCHIATRIC: The patient is alert and oriented x 3.  Frustrated!! SKIN: No obvious rash, lesion, or ulcer.   DATA REVIEW:   CBC  No results for input(s): WBC, HGB, HCT, PLT in the last 168 hours.  Chemistries  Recent Labs  Lab 03/12/17 0503  CREATININE 0.67    Microbiology Results   No results found for this or any previous visit (from the past 240 hour(s)).  RADIOLOGY:  No results found.   Management plans discussed  with the patient, family and they are in agreement.  CODE STATUS:     Code Status Orders  (From admission, onward)        Start     Ordered   03/05/17 1906  Full code  Continuous     03/05/17 1906    Code Status History    Date Active Date Inactive Code Status Order ID Comments User Context   01/16/2017 12:44 01/18/2017 21:27 Full Code [id number]  Altamese Dilling, MD Inpatient   03/24/2016 12:13 03/27/2016 21:20 Full Code [id number]  Adrian Saran, MD Inpatient   10/02/2015 19:59 10/05/2015 17:30 Full Code [id number]  Adrian Saran, MD Inpatient   07/14/2015 16:05 07/17/2015 21:20 Full Code [id number]  Enid Baas, MD Inpatient      TOTAL TIME TAKING CARE OF THIS PATIENT: *40* minutes.    Mychael Soots M.D on 03/13/2017 at 7:38 AM  Between 7am to 6pm - Pager - [phone] After 6pm go to [email or web address] - Social research officer, government  [facility] [place] [facility]   Office  [phone]  CC: Primary care physician; Lauro Regulus, MD

## 2017-03-13 NOTE — Progress Notes (Signed)
Occupational Therapy Treatment Patient Details Name: Lisa SaleJacqueline Renee Olund MRN: [id number] DOB: 1960/09/20 Today's Date: 03/13/2017    History of present illness Pt is a 56 y.o. female with a known history of MS and recent fall with wrist fracture who presents with LE weakness. Per pt, MD has cleared L wrist now WBAT, brace for comfort/protection if desired when going out. Pt is now admitted for MS exaccerbation.    OT comments  Pt seen for OT treatment this date. Pt pleasant but voices frustration with insurance denying her rehab access. Pt eager to work with OT, had questions about the wrist exercises taught previous date. OT reviewed wrist exercises using t-band for wrist ext/flex, ulnar/radial deviation, pronation/supination with verbal instruction, visual demonstration, and teach back. Pt demonstrated understanding. Very thankful for additional training to ensure carryover of proper learned techniques. Will continue to progress. Pt remains excellent CIR candidate to maximize return to PLOF and quality of life as well as to prevent further functional decline and increased caregiver burden.    Follow Up Recommendations  CIR    Equipment Recommendations  Tub/shower bench;Other (comment)(bariatric tub transfer bench with commode access)    Recommendations for Other Services      Precautions / Restrictions Precautions Precautions: Fall Required Braces or Orthoses: Other Brace/Splint Other Brace/Splint: bilateral AFOs Restrictions Weight Bearing Restrictions: No LUE Weight Bearing: Weight bearing as tolerated Other Position/Activity Restrictions: LUE WBAT, no brace required; bilateral LE AFOs       Mobility Bed Mobility                  Transfers                      Balance                                           ADL either performed or assessed with clinical judgement   ADL                                                Vision       Perception     Praxis      Cognition Arousal/Alertness: Awake/alert Behavior During Therapy: WFL for tasks assessed/performed Overall Cognitive Status: Within Functional Limits for tasks assessed                                          Exercises Other Exercises Other Exercises: OT reviewed wrist exercises using t-band for wrist ext/flex, ulnar/radial deviation, pronation/supination with verbal instruction, visual demonstration, and teach back. Pt demonstrated understanding. Very thankful for additional training to ensure carryover of proper learned techniques.   Shoulder Instructions       General Comments      Pertinent Vitals/ Pain       Pain Assessment: No/denies pain  Home Living                                          Prior Functioning/Environment  Frequency  Min 3X/week        Progress Toward Goals  OT Goals(current goals can now be found in the care plan section)  Progress towards OT goals: Progressing toward goals  Acute Rehab OT Goals Patient Stated Goal: To be able to go to rehab.  OT Goal Formulation: With patient/family Time For Goal Achievement: 03/20/17 Potential to Achieve Goals: Good  Plan Discharge plan remains appropriate;Frequency remains appropriate    Co-evaluation                 AM-PAC PT "6 Clicks" Daily Activity     Outcome Measure   Help from another person eating meals?: None Help from another person taking care of personal grooming?: None Help from another person toileting, which includes using toliet, bedpan, or urinal?: A Lot Help from another person bathing (including washing, rinsing, drying)?: A Lot Help from another person to put on and taking off regular upper body clothing?: A Little Help from another person to put on and taking off regular lower body clothing?: A Lot 6 Click Score: 17    End of Session    OT Visit Diagnosis: Other  abnormalities of gait and mobility (R26.89);Muscle weakness (generalized) (M62.81)   Activity Tolerance Patient tolerated treatment well   Patient Left in bed;with call bell/phone within reach;with bed alarm set   Nurse Communication          Time: 6203-5597 OT Time Calculation (min): 11 min  Charges: OT Treatments $Therapeutic Exercise: 8-22 mins  Richrd Prime, MPH, MS, OTR/L ascom [phone] 03/13/17, 11:09 AM

## 2017-03-13 NOTE — Care Management (Signed)
   Patient suffers from multiple sclerosis which impairs their ability to perform daily  activities liketoileting, feeding, dressing, grooming, bathing  in the home. A cane, walker, crutch will not resolve issue with performing activities of daily living. A wheelchair will allow patient to safely perform daily activities. Patient can safely propel the wheelchair in the home or has a caregiver who can provide assistance.   Patient has multiple sclerosis which requires upper/lower body to be positioned in ways not feasible with a normal bed. Head must be elevated at least 30 degrees or more. Frequently requires changes in body position which cannot be achieved with a normal bed.

## 2017-03-13 NOTE — Care Management (Signed)
Patient called this RNCM stating that she would be appealing her discharge today with [employer]. I have notified team involved of patient's discharge planning.

## 2017-03-13 NOTE — Plan of Care (Signed)
Pt disappointed that she was not approved for SNF.  Pt  has not had BM in over a week but states that this is normal for her and refuses stool softener or laxative.

## 2017-03-14 NOTE — Progress Notes (Signed)
Bed delivered at pts place of transport.  Ready for discharge ems called to transport pt discharge instructions discussed with pt  Diet / meds / activity  And f/u.  [facility] to  Follow pt at home

## 2017-03-14 NOTE — Care Management (Signed)
An Important Message From Medicare About Your Rights signed per Ms. Victorian and other requested information faxed to [facility]. Gwenette Greet RN MSN CCM Care Management [phone]

## 2017-03-14 NOTE — Discharge Summary (Signed)
[facility] [place] [facility]   PATIENT NAME: Lisa BeenJacqueline Williams    MR#:  [id number]  DATE OF BIRTH:  09/09/1960  DATE OF ADMISSION:  03/05/2017 ADMITTING PHYSICIAN: Alford Highlandichard Wieting, MD  DATE OF DISCHARGE: 03/14/2017  PRIMARY CARE PHYSICIAN: Lauro RegulusAnderson, Marshall W, MD    ADMISSION DIAGNOSIS:  Multiple sclerosis exacerbation (HCC) [G35]  DISCHARGE DIAGNOSIS:  Bilateral LE weakness due to MS flare  SECONDARY DIAGNOSIS:   Past Medical History:  Diagnosis Date  . Abdominal pain, right upper quadrant   . Back pain   . Calculus of kidney 12/09/2013  . Chronic back pain    unspecified  . Chronic left shoulder pain 07/19/2015  . Functional disorder of bladder    other  . Galactorrhea 11/26/2014   Chronic   . Hereditary and idiopathic neuropathy 08/19/2013  . HPV test positive   . Hypercholesteremia 08/19/2013  . Incomplete bladder emptying   . Microscopic hematuria   . MS (multiple sclerosis) (HCC)   . Muscle spasticity 05/21/2014  . Nonspecific findings on examination of urine    other  . Osteopenia   . Status post laparoscopic supracervical hysterectomy 11/26/2014  . Tobacco user 11/26/2014  . Wrist fracture     HOSPITAL COURSE:   Lisa Williams an 56 y.o.femalewith a history of MS and recent fall with wrist fracture who presents with LE weakness. Patient reports that she is usually able to help with transfers and walk with walker. Yesterday AM was unable to help with transfer and was unable to ambulate, even with help, using walker. Also reports some urinary incontinence.   1. Multiple sclerosis exacerbation.  -treated with  1 g IV Solu-Medrol daily for 5 days. (completed) -Neurology consultation appreciated -Physical therapy and occupational therapy evaluations--recommends rehab. Pt's insurance denied rehab x2  -Continue in-house physical therapy.   2. Essential hypertension on losartan  3. Recent wrist fracture.  Started OT.  PT was denied rehab by outsourced company as well!! Only option left to d/c Home HHPT CM to help with hospital bed and transportation.  CONSULTS OBTAINED:  Treatment Team:  Kym [facility], Neuro1, MD Thana Farreynolds, Leslie, MD Auburn BilberryPatel, Shreyang, MD Enedina FinnerPatel, Jhordyn Hoopingarner, MD  DRUG ALLERGIES:   Allergies  Allergen Reactions  . Amlodipine Other (See Comments)    edema  . Povidone Iodine Rash    DISCHARGE MEDICATIONS:   Current Discharge Medication List    CONTINUE these medications which have NOT CHANGED   Details  acetaminophen (TYLENOL) 325 MG tablet Take 650 mg by mouth every 8 (eight) hours as needed. Do not exceed 3000 mg in 24 hrs    baclofen (LIORESAL) 20 MG tablet Take 20 mg by mouth every 8 (eight) hours as needed for muscle spasms.     Calcium Carb-Cholecalciferol (CALCIUM 600+D) 600-800 MG-UNIT TABS Take 1 tablet by mouth daily.    docusate sodium (COLACE) 100 MG capsule Take 100 mg by mouth daily as needed for mild constipation.    HYDROcodone-acetaminophen (NORCO) 5-325 MG tablet Take 1 tablet by mouth every 6 (six) hours as needed for moderate pain. Qty: 120 tablet, Refills: 0    losartan (COZAAR) 100 MG tablet Take 100 mg by mouth daily.     meloxicam (MOBIC) 15 MG tablet Take 1 tablet (15 mg total) by mouth 2 (two) times daily as needed for pain. Refills: 2    metoCLOPramide (REGLAN) 10 MG tablet Take 10 mg by mouth every 8 (eight) hours as needed for nausea. If Zofran not effective  Multiple Vitamin (MULTIVITAMIN) tablet Take 1 tablet by mouth daily.    oxybutynin (DITROPAN) 5 MG tablet Take 1 tablet (5 mg total) by mouth 2 (two) times daily.    polyethylene glycol (MIRALAX / GLYCOLAX) packet Take 17 g by mouth every other day as needed for mild constipation.     tiZANidine (ZANAFLEX) 4 MG tablet Take 4 mg by mouth every 6 (six) hours as needed for muscle spasms.     RITUXIMAB IV Pt uses every 6 months due October 2018        If you experience  worsening of your admission symptoms, develop shortness of breath, life threatening emergency, suicidal or homicidal thoughts you must seek medical attention immediately by calling 911 or calling your MD immediately  if symptoms less severe.  You Must read complete instructions/literature along with all the possible adverse reactions/side effects for all the Medicines you take and that have Williams prescribed to you. Take any new Medicines after you have completely understood and accept all the possible adverse reactions/side effects.   Please note  You were cared for by a hospitalist during your hospital stay. If you have any questions about your discharge medications or the care you received while you were in the hospital after you are discharged, you can call the unit and asked to speak with the hospitalist on call if the hospitalist that took care of you is not available. Once you are discharged, your primary care physician will handle any further medical issues. Please note that NO REFILLS for any discharge medications will be authorized once you are discharged, as it is imperative that you return to your primary care physician (or establish a relationship with a primary care physician if you do not have one) for your aftercare needs so that they can reassess your need for medications and monitor your lab values. Today   SUBJECTIVE   Doing well  VITAL SIGNS:  Blood pressure (!) 135/59, pulse (!) 56, temperature 97.8 F (36.6 C), temperature source Oral, resp. rate 20, height 5\' 11"  (1.803 m), weight 110.7 kg (244 lb), SpO2 99 %.  I/O:    Intake/Output Summary (Last 24 hours) at 03/14/2017 0807 Last data filed at 03/14/2017 0500 Gross per 24 hour  Intake 240 ml  Output 1000 ml  Net -760 ml    PHYSICAL EXAMINATION:  GENERAL:  56 y.o.-year-old patient lying in the bed with no acute distress. obese EYES: Pupils equal, round, reactive to light and accommodation. No scleral icterus.  Extraocular muscles intact.  HEENT: Head atraumatic, normocephalic. Oropharynx and nasopharynx clear.  NECK:  Supple, no jugular venous distention. No thyroid enlargement, no tenderness.  LUNGS: Normal breath sounds bilaterally, no wheezing, rales,rhonchi or crepitation. No use of accessory muscles of respiration.  CARDIOVASCULAR: S1, S2 normal. No murmurs, rubs, or gallops.  ABDOMEN: Soft, non-tender, non-distended. Bowel sounds present. No organomegaly or mass.  EXTREMITIES: No pedal edema, cyanosis, or clubbing.  NEUROLOGIC: Cranial nerves II through XII are intact. Bilateral LE weakness PSYCHIATRIC: The patient is alert and oriented x 3.  Frustrated!! SKIN: No obvious rash, lesion, or ulcer.   DATA REVIEW:   CBC  No results for input(s): WBC, HGB, HCT, PLT in the last 168 hours.  Chemistries  Recent Labs  Lab 03/12/17 0503  CREATININE 0.67    Microbiology Results   No results found for this or any previous visit (from the past 240 hour(s)).  RADIOLOGY:  No results found.   Management plans discussed with the  patient, family and they are in agreement.  CODE STATUS:     Code Status Orders  (From admission, onward)        Start     Ordered   03/05/17 1906  Full code  Continuous     03/05/17 1906    Code Status History    Date Active Date Inactive Code Status Order ID Comments User Context   01/16/2017 12:44 01/18/2017 21:27 Full Code [id number]  Altamese Dilling, MD Inpatient   03/24/2016 12:13 03/27/2016 21:20 Full Code [id number]  Adrian Saran, MD Inpatient   10/02/2015 19:59 10/05/2015 17:30 Full Code [id number]  Adrian Saran, MD Inpatient   07/14/2015 16:05 07/17/2015 21:20 Full Code [id number]  Enid Baas, MD Inpatient      TOTAL TIME TAKING CARE OF THIS PATIENT: *40* minutes.    Marlys Stegmaier M.D on 03/14/2017 at 8:07 AM  Between 7am to 6pm - Pager - [phone] After 6pm go to [email or web address] - Social research officer, government  [facility]  Office   [phone]  CC: Primary care physician; Lauro Regulus, MD

## 2017-03-14 NOTE — Care Management Important Message (Signed)
Important Message  Patient Details  Name: Lisa Williams MRN: [id number] Date of Birth: [birth year]   Medicare Important Message Given:  Yes    Gwenette Greet, RN 03/14/2017, 9:53 AM

## 2017-03-14 NOTE — Clinical Social Work Note (Signed)
Csw was informed that patient's appeal was still denied by the third party.  CSW spoke with patient, and provided support for her by listening to her express her frustrations with insurance company.  Patient has agreed to go home with home health case manager made aware.  CSW to sign off please reconsult if other social work needs arise.  Ervin Knack. Johndavid Geralds, MSW, Theresia Majors [phone]  03/14/2017 8:57 AM

## 2017-03-15 ENCOUNTER — Other Ambulatory Visit: Payer: Self-pay | Admitting: *Deleted

## 2017-03-15 DIAGNOSIS — G35 Multiple sclerosis: Secondary | ICD-10-CM

## 2017-03-15 NOTE — Patient Outreach (Signed)
[facility]) Care Management  03/15/2017  Ziaira Hake Biddinger Jul 28, 1960 [id number]   Referral received on 03/05/17 from patient's health plan to assist with community resources for in home care. Patient ws admitted to the hospital  From 03/05/17-03/14/17. Phone call to patient,  Introduced self and explained [facility] care management program. Patient provided  verbal consent to participate in the [facility] care management program. Patient states that she is currently residing with her sister, however she works during the day and cannot provide care. Patient further reports that her parents are elderly and are unable to assist much with care as well. Patient was ordered [facility] through [facility], however they have not called yet. Per patient "I need as much help as I can get".  Plan: Joint home visit with RNCM scheduled for 03/19/17 .   Sister's address; [address] [place], [place]

## 2017-03-15 NOTE — Care Management (Signed)
Anibal Henderson, RN representative for [facility] notified of discharge Gwenette Greet RN MSN CCM Care management [phone]

## 2017-03-16 ENCOUNTER — Other Ambulatory Visit: Payer: Self-pay | Admitting: *Deleted

## 2017-03-16 ENCOUNTER — Encounter: Payer: Self-pay | Admitting: *Deleted

## 2017-03-16 NOTE — Patient Outreach (Signed)
[facility]) Care Management  03/16/2017  Lisa Williams June 09, 1960 [id number]   Phone call to patient to confirm home visit scheduled for 03/19/17. Per patient, she received  Call from [facility] stating that she had won her appeal for her recent hospital stay. Per patient, she was not awrae that she could remain in the hospital until the appeal process was complete. Patient continues to report limited caregiver support.  Home visit confirmed for 03/19/17    [name] [facility] Care Management [phone]

## 2017-03-16 NOTE — Patient Outreach (Signed)
Successful telephone encounter to [facility], 56 year old female follow up on [facility]  Today for recent discharge from the hospital, complicated discharge, needs transition of care.  View in Epic recent hospitalization October 29 to November 7,2018 for Multiple sclerosis exacerbation.   Pt's history includes but not limited to Adiposity, Hypercholesteremia, chronic bladder infection, renal colic. Spoke with pt, HIPAA identifiers verified, discussed purpose of call- follow up on referral/recent hospitalization.   [facility], needs an aide, staying at her sister's, aging mother struggling to take care of her, wanted to go to SNF to get stronger, broke her wrist from a fall, does not want to fall again.  Pt reports she called [facility] yesterday, they did not know she was discharged, was told will try to get someone out to her.  Pt reports prior to this hospitalization was mobile.  Pt reports she did get her hospital bed.  Pt reports cancelled her appointments for PCP and Neurologist, does not know how she  would get there, told them would call back to reschedule when  has transportation.  Pt reports taking all of her medications As ordered-  *Patient was recently discharged from hospital and all medications have been reviewed.  RN CM discussed  Joining [facility] LCSW  03/19/17 for a joint home visit to which pt was in agreement with.    Plan:  As discussed with pt, plan to follow up again next week - initial home visit (part of ongoing transition of care)           Barrier letter sent to Dr. Dare[facility]Anderson [facility].    Shayne Alkenose M.   Deiondra Denley RN CCM [facility] Care Management  [phone]

## 2017-03-18 DIAGNOSIS — G35 Multiple sclerosis: Secondary | ICD-10-CM | POA: Diagnosis not present

## 2017-03-18 DIAGNOSIS — G609 Hereditary and idiopathic neuropathy, unspecified: Secondary | ICD-10-CM | POA: Diagnosis not present

## 2017-03-18 DIAGNOSIS — Z9181 History of falling: Secondary | ICD-10-CM | POA: Diagnosis not present

## 2017-03-18 DIAGNOSIS — M8580 Other specified disorders of bone density and structure, unspecified site: Secondary | ICD-10-CM | POA: Diagnosis not present

## 2017-03-18 DIAGNOSIS — I10 Essential (primary) hypertension: Secondary | ICD-10-CM | POA: Diagnosis not present

## 2017-03-18 DIAGNOSIS — S62102D Fracture of unspecified carpal bone, left wrist, subsequent encounter for fracture with routine healing: Secondary | ICD-10-CM | POA: Diagnosis not present

## 2017-03-19 ENCOUNTER — Other Ambulatory Visit: Payer: Self-pay | Admitting: *Deleted

## 2017-03-19 ENCOUNTER — Encounter: Payer: Self-pay | Admitting: *Deleted

## 2017-03-19 NOTE — Patient Outreach (Signed)
[facility]) Care Management   03/19/2017  [place] 1961-02-13 [id number]  Lisa Williams is an 56 y.o. female  Subjective:  Pt reports home health came out yesterday (opened case), was told SW to come today.  Pt  reports spent 6 weeks in SNF following hospitalization for left wrist fracture, was  Non weight bearing until 03/01/17 which caused an MS flare up- readmitted to the hospital  October 29,2018.   Pt reports was told not eligible for SNF, currently lives at sister's house, Bed bound, 63 year old mother limited in assisting her/sister works.   During home visit  Pt received several calls from [facility], reports to RN CM [facility] RN to come  Today, [facility] SW and PT to come tomorrow.   Pt reports her goal is to get back to being  Independent, ambulate, drive.    Objective:   Vitals:   03/19/17 1104  BP: 120/70  Pulse: 68  Resp: (!) 28  SpO2: 99%   ROS  Physical Exam  Constitutional: She is oriented to person, place, and time. She appears well-developed and well-nourished.  Cardiovascular: Normal rate, regular rhythm and normal heart sounds.  Respiratory: Effort normal and breath sounds normal.  GI: Soft. Bowel sounds are normal.  Musculoskeletal: She exhibits edema.  Small amount of edema bilateral outer ankles.  Currently unable to ambulate, remains in bed.   Neurological: She is alert and oriented to person, place, and time.  Skin: Skin is warm and dry.  Psychiatric: She has a normal mood and affect. Her behavior is normal. Thought content normal.    Encounter Medications:   Outpatient Encounter Medications as of 03/19/2017  Medication Sig Note  . acetaminophen (TYLENOL) 325 MG tablet Take 650 mg by mouth every 8 (eight) hours as needed. Do not exceed 3000 mg in 24 hrs 03/16/2017: As needed.   . baclofen (LIORESAL) 20 MG tablet Take 20 mg by mouth every 8 (eight) hours as needed for muscle spasms.  03/16/2017: Per pt taking  three times a day   . Calcium Carb-Cholecalciferol (CALCIUM 600+D) 600-800 MG-UNIT TABS Take 1 tablet by mouth daily.   Marland Kitchen docusate sodium (COLACE) 100 MG capsule Take 100 mg by mouth daily as needed for mild constipation.   Marland Kitchen HYDROcodone-acetaminophen (NORCO) 5-325 MG tablet Take 1 tablet by mouth every 6 (six) hours as needed for moderate pain. (Patient not taking: Reported on 03/16/2017)   . losartan (COZAAR) 100 MG tablet Take 100 mg by mouth daily.    . meloxicam (MOBIC) 15 MG tablet Take 1 tablet (15 mg total) by mouth 2 (two) times daily as needed for pain. 03/16/2017: As needed.   . metoCLOPramide (REGLAN) 10 MG tablet Take 10 mg by mouth every 8 (eight) hours as needed for nausea. If Zofran not effective   . Multiple Vitamin (MULTIVITAMIN) tablet Take 1 tablet by mouth daily.   Marland Kitchen oxybutynin (DITROPAN) 5 MG tablet Take 1 tablet (5 mg total) by mouth 2 (two) times daily.   . polyethylene glycol (MIRALAX / GLYCOLAX) packet Take 17 g by mouth every other day as needed for mild constipation.    Marland Kitchen RITUXIMAB IV Pt uses every 6 months due October 2018 03/16/2017: Per pt receives on an outpatient   . tiZANidine (ZANAFLEX) 4 MG tablet Take 4 mg by mouth every 6 (six) hours as needed for muscle spasms.     No facility-administered encounter medications on file as of 03/19/2017.  Functional Status:   In your present state of health, do you have any difficulty performing the following activities: 03/19/2017 03/05/2017  Hearing? N N  Vision? N N  Difficulty concentrating or making decisions? N N  Walking or climbing stairs? Y Y  Dressing or bathing? Y N  Doing errands, shopping? Tempie Donning  Preparing Food and eating ? Y -  Using the Toilet? Y -  In the past six months, have you accidently leaked urine? N -  Do you have problems with loss of bowel control? N -  Managing your Medications? Y -  Managing your Finances? Y -  Housekeeping or managing your Housekeeping? N -  Some recent data might be  hidden    Fall/Depression Screening:    Fall Risk  03/19/2017  Falls in the past year? Yes  Number falls in past yr: 1  Injury with Fall? Yes  Risk for fall due to : Impaired mobility  Follow up Falls prevention discussed   PHQ 2/9 Scores 03/19/2017  PHQ - 2 Score 0    Assessment: Co visit done with [facility] SW today.  Pleasant 56 year old female, resides at sister's home, sister present during home  Visit, Mom available  in the home.   This RN CM received a referral from [facility] SW for Community CM services,transition of care/recent hospitalization October 29-November 7,2018 for MS exacerbation.  Pt's history includes but not limited to left wrist fracture, Hypercholesteremia, Galactorrhea, hereditary and idiopathic neuropathy,Adiposity.      Multiple Sclerosis:  Pt remained in bed during home visit, per pt has been out of bed since          Discharge home.   No complaints of pain.     Medications: reviewed, per pt needs next treatment of  Rituximab IV before the end of the          Year , has been going to  [facility], would like to go to [facility] due to          Impaired mobility.    MD follow up appointments:  Cancelled PCP and Neurologist appointments due to impaired         Mobility- unable to ambulate/use wheelchair. To reschedule when stronger/can transfer.  Plan:  As discussed with pt, plan to continue to follow for transition of care, coworkers  Covering for this RN CM will follow up once a week for the next 2 weeks.  This RN CM  Provided pt with [facility]'s main office number to call if needed.   RN CM will follow again  Telephonically upon return to work.              Plan to send Dr. Ouida Sills 03/19/17 initial home visit encounter.     [facility] CM Care Plan Problem One     Most Recent Value  Care Plan Problem One  Risk for readmission related to recent hospitalization for MS exacerbation   Role Documenting the Problem One  Care Management [place] for Problem  One  Active  [facility] Long Term Goal   Pt would not readmit to the hospital within the next 31 days   [facility] Long Term Goal Start Date  03/16/17  Interventions for Problem One Long Term Goal  Home visit assessement done, meds reviewed, discussed importance of f/u with PCP   [facility] CM Short Term Goal #1   Home health services would start within the next 7 days   [facility]  CM Short Term Goal #1 Start Date  03/16/17  [facility] CM Short Term Goal #1 Met Date  03/19/17  [facility] CM Short Term Goal #2   Pt would see an improvement in strength within the next 7 days   [facility] CM Short Term Goal #2 Start Date  03/16/17  [name] CM Short Term Goal #2 Met Date  03/19/17  [facility] CM Short Term Goal #3  Pt would get back to being independent within the next 25 days   [facility] CM Short Term Goal #3 Start Date  03/19/17  Interventions for Short Tern Goal #3  Discussed with pt working with [facility] PT - gain strength in lower extremities, be able to ambulate     Zara Chess.   [place] Care Management  [phone]

## 2017-03-19 NOTE — Patient Outreach (Addendum)
[facility]) Care Management  [facility]  03/19/2017  Delsa SaleJacqueline Renee Speranza June 27, 1960 [id number]  Subjective:  Patient is a 56 year old female, currently residing with her sister due to her limited mobility. Patient currently in a hospital bed, cannot walk without assistance. [facility] patient [facility], PT and OT being arranged and scheduled to begin working with her this week.  Patient's mother and 2 sisters are primary care providers, however patient's sister works full time, her other sister has her own medical  and her mother is elderly and cannot physically provide care.  Per patient, she has received [facility], however has filed a Level III appeal for SNF, waiting on decision to possibly authorize her return to SNF.  Per patient,ifd she had an opportunity to receive in patient rehab, she is sure she can return to being fully functional.    Objective:   Encounter Medications:  Outpatient Encounter Medications as of 03/19/2017  Medication Sig Note  . acetaminophen (TYLENOL) 325 MG tablet Take 650 mg by mouth every 8 (eight) hours as needed. Do not exceed 3000 mg in 24 hrs 03/16/2017: As needed.   . baclofen (LIORESAL) 20 MG tablet Take 20 mg by mouth every 8 (eight) hours as needed for muscle spasms.  03/16/2017: Per pt taking three times a day   . Calcium Carb-Cholecalciferol (CALCIUM 600+D) 600-800 MG-UNIT TABS Take 1 tablet by mouth daily.   Marland Kitchen. docusate sodium (COLACE) 100 MG capsule Take 100 mg by mouth daily as needed for mild constipation.   Marland Kitchen. HYDROcodone-acetaminophen (NORCO) 5-325 MG tablet Take 1 tablet by mouth every 6 (six) hours as needed for moderate pain. (Patient not taking: Reported on 03/16/2017)   . losartan (COZAAR) 100 MG tablet Take 100 mg by mouth daily.    . meloxicam (MOBIC) 15 MG tablet Take 1 tablet (15 mg total) by mouth 2 (two) times daily as needed for pain. 03/16/2017: As needed.    . metoCLOPramide (REGLAN) 10 MG tablet Take 10 mg by mouth every 8 (eight) hours as needed for nausea. If Zofran not effective   . Multiple Vitamin (MULTIVITAMIN) tablet Take 1 tablet by mouth daily.   Marland Kitchen. oxybutynin (DITROPAN) 5 MG tablet Take 1 tablet (5 mg total) by mouth 2 (two) times daily.   . polyethylene glycol (MIRALAX / GLYCOLAX) packet Take 17 g by mouth every other day as needed for mild constipation.    Marland Kitchen. RITUXIMAB IV Pt uses every 6 months due October 2018 03/16/2017: Per pt receives on an outpatient   . tiZANidine (ZANAFLEX) 4 MG tablet Take 4 mg by mouth every 6 (six) hours as needed for muscle spasms.     No facility-administered encounter medications on file as of 03/19/2017.     Functional Status:  In your present state of health, do you have any difficulty performing the following activities: 03/19/2017 03/05/2017  Hearing? N N  Vision? N N  Difficulty concentrating or making decisions? N N  Walking or climbing stairs? Y Y  Dressing or bathing? Y N  Doing errands, shopping? Malvin JohnsY Y  Preparing Food and eating ? Y -  Using the Toilet? Y -  In the past six months, have you accidently leaked urine? N -  Do you have problems with loss of bowel control? N -  Managing your Medications? Y -  Managing your Finances? Y -  Housekeeping or managing your Housekeeping? N -  Some recent data  might be hidden    Fall/Depression Screening:  PHQ 2/9 Scores 03/19/2017  PHQ - 2 Score 0    Assessment:  [facility] receiving [facility] PT/OT/SW. Patient very pleasant, positive, however frustrated with authorization process regarding skilled  Care. Patient currently total care, and needs additional in home assistance due to limited family support.  Patient referred to [facility] for personal care services funded by [facility] to provide temporary in home care for a period of 30 days. Transportation needs discussed, per patient she does not want to be transported by non-emergent ambulance and has  cancelled her follow up appointments unitl she can get stronger.  Plan: This Child psychotherapist will follow up with [facility] to start in home support for patient,    [facility] CM Care Plan Problem One     Most Recent Value  Care Plan Problem One  Recent hospitalization due to MS exacerbation  Role Documenting the Problem One  Clinical Social Worker  Care Plan for Problem One  Active  [facility] CM Short Term Goal #1   Patient to verbalize the start of  in home personal care services within the next 30 days  [facility] CM Short Term Goal #1 Start Date  03/19/17  Interventions for Short Term Goal #1  Patient referred to Homecare [facility] for in home personal care services, medication list to be fowarded.

## 2017-03-20 ENCOUNTER — Other Ambulatory Visit: Payer: Self-pay | Admitting: *Deleted

## 2017-03-20 DIAGNOSIS — Z9181 History of falling: Secondary | ICD-10-CM | POA: Diagnosis not present

## 2017-03-20 DIAGNOSIS — G609 Hereditary and idiopathic neuropathy, unspecified: Secondary | ICD-10-CM | POA: Diagnosis not present

## 2017-03-20 DIAGNOSIS — S62102D Fracture of unspecified carpal bone, left wrist, subsequent encounter for fracture with routine healing: Secondary | ICD-10-CM | POA: Diagnosis not present

## 2017-03-20 DIAGNOSIS — G35 Multiple sclerosis: Secondary | ICD-10-CM | POA: Diagnosis not present

## 2017-03-20 DIAGNOSIS — M8580 Other specified disorders of bone density and structure, unspecified site: Secondary | ICD-10-CM | POA: Diagnosis not present

## 2017-03-20 DIAGNOSIS — I10 Essential (primary) hypertension: Secondary | ICD-10-CM | POA: Diagnosis not present

## 2017-03-20 NOTE — Patient Outreach (Signed)
[facility]) Care Management  03/20/2017  Lisa Williams [birth year] [id number]   Phone call to [facility], spoke with April to complete referral on patient's behalf. Medication list and demographic information to be faxed.    [name] [facility] Care Management [phone]

## 2017-03-21 ENCOUNTER — Other Ambulatory Visit: Payer: Self-pay | Admitting: *Deleted

## 2017-03-21 NOTE — Patient Outreach (Signed)
  [facility]) Care Management  03/21/2017  Lisa Williams 1961/01/06 [id number]   Phone call received from social worker from [facility]-Anne Olena Heckle [phone] to collaborate on patient's care needs.  This Child psychotherapist returned the call, however social worker was not reached. Voicemail message left for a return call.    [name] [facility] Care Management [phone]

## 2017-03-21 NOTE — Patient Outreach (Signed)
[facility]) Care Management  03/21/2017  Lisa Williams [name] [birth year] [id number]   Return phone call from [facility]-social worker [name]. [name] confirms that she is involved with patient and can assist with transition to ALF if needed. She confirmed that patient's insurance does not cover incontinent supplies, however contacting the MS society may be helpful.   [name] [facility] Care Management [phone]

## 2017-03-22 DIAGNOSIS — G35 Multiple sclerosis: Secondary | ICD-10-CM | POA: Diagnosis not present

## 2017-03-23 ENCOUNTER — Other Ambulatory Visit: Payer: Self-pay | Admitting: *Deleted

## 2017-03-23 NOTE — Patient Outreach (Signed)
[facility]) Care Management  03/23/2017  Lisa Williams Jan 23, 1961 [id number]   Phone call to patient to confirm the start of in home personal care services through [facility]. Per patient, the intake was completed today and the plan is for the personal care to start on next week (Monday, Wednesday and Friday evenings). Patient very appreciative of this service.  Plan: This social worker to follow up with patient within 2 weeks regarding in home care and additional social work needs.   Lisa ReamsChrystal Kyli Sorter, LCSW [facility] Care Management [phone]

## 2017-03-26 DIAGNOSIS — G609 Hereditary and idiopathic neuropathy, unspecified: Secondary | ICD-10-CM | POA: Diagnosis not present

## 2017-03-26 DIAGNOSIS — M8580 Other specified disorders of bone density and structure, unspecified site: Secondary | ICD-10-CM | POA: Diagnosis not present

## 2017-03-26 DIAGNOSIS — Z9181 History of falling: Secondary | ICD-10-CM | POA: Diagnosis not present

## 2017-03-26 DIAGNOSIS — G35 Multiple sclerosis: Secondary | ICD-10-CM | POA: Diagnosis not present

## 2017-03-26 DIAGNOSIS — S62102D Fracture of unspecified carpal bone, left wrist, subsequent encounter for fracture with routine healing: Secondary | ICD-10-CM | POA: Diagnosis not present

## 2017-03-26 DIAGNOSIS — I10 Essential (primary) hypertension: Secondary | ICD-10-CM | POA: Diagnosis not present

## 2017-03-28 DIAGNOSIS — S62102D Fracture of unspecified carpal bone, left wrist, subsequent encounter for fracture with routine healing: Secondary | ICD-10-CM | POA: Diagnosis not present

## 2017-03-28 DIAGNOSIS — M8580 Other specified disorders of bone density and structure, unspecified site: Secondary | ICD-10-CM | POA: Diagnosis not present

## 2017-03-28 DIAGNOSIS — Z9181 History of falling: Secondary | ICD-10-CM | POA: Diagnosis not present

## 2017-03-28 DIAGNOSIS — G35 Multiple sclerosis: Secondary | ICD-10-CM | POA: Diagnosis not present

## 2017-03-28 DIAGNOSIS — G609 Hereditary and idiopathic neuropathy, unspecified: Secondary | ICD-10-CM | POA: Diagnosis not present

## 2017-03-28 DIAGNOSIS — I10 Essential (primary) hypertension: Secondary | ICD-10-CM | POA: Diagnosis not present

## 2017-03-30 ENCOUNTER — Other Ambulatory Visit: Payer: Self-pay | Admitting: *Deleted

## 2017-03-30 NOTE — Patient Outreach (Addendum)
[facility]) Care Management  03/30/2017  Lisa SaleJacqueline Renee Williams 04/29/1961 [id number]   Covering for assigned care manager, R. Pierzchalla.  Weekly transition of care call placed to member.  She report she is improving well.  State she was able to stand with home health PT, hoping to continue to improve over the next couple weeks.  Report compliance with medications, state Home Care providers started on Wednesday evening, aide will come today as well.  State she is waiting to hear back from her appeal to go to facility for more intense rehabilitation.  Denies any urgent concerns, will follow up next week.   [facility] CM Care Plan Problem One     Most Recent Value  Care Plan Problem One  Risk for readmission related to recent hospitalization for MS exacerbation   Role Documenting the Problem One  Care Management Coordinator  Care Plan for Problem One  Active  [facility] CM Short Term Goal #3  Pt would get back to being independent within the next 25 days   [facility] CM Short Term Goal #3 Start Date  03/19/17  Interventions for Short Tern Goal #3  Re-educated of importance of working with PT in effort to increase strength.      Lisa DurieMonica Autum Benfer, MSN, RN [facility] Care Management  [facility] Manager [phone]

## 2017-04-03 DIAGNOSIS — I10 Essential (primary) hypertension: Secondary | ICD-10-CM | POA: Diagnosis not present

## 2017-04-03 DIAGNOSIS — G35 Multiple sclerosis: Secondary | ICD-10-CM | POA: Diagnosis not present

## 2017-04-03 DIAGNOSIS — Z9181 History of falling: Secondary | ICD-10-CM | POA: Diagnosis not present

## 2017-04-03 DIAGNOSIS — S62102D Fracture of unspecified carpal bone, left wrist, subsequent encounter for fracture with routine healing: Secondary | ICD-10-CM | POA: Diagnosis not present

## 2017-04-03 DIAGNOSIS — M8580 Other specified disorders of bone density and structure, unspecified site: Secondary | ICD-10-CM | POA: Diagnosis not present

## 2017-04-03 DIAGNOSIS — G609 Hereditary and idiopathic neuropathy, unspecified: Secondary | ICD-10-CM | POA: Diagnosis not present

## 2017-04-04 DIAGNOSIS — S62102D Fracture of unspecified carpal bone, left wrist, subsequent encounter for fracture with routine healing: Secondary | ICD-10-CM | POA: Diagnosis not present

## 2017-04-04 DIAGNOSIS — Z9181 History of falling: Secondary | ICD-10-CM | POA: Diagnosis not present

## 2017-04-04 DIAGNOSIS — G35 Multiple sclerosis: Secondary | ICD-10-CM | POA: Diagnosis not present

## 2017-04-04 DIAGNOSIS — G609 Hereditary and idiopathic neuropathy, unspecified: Secondary | ICD-10-CM | POA: Diagnosis not present

## 2017-04-04 DIAGNOSIS — I10 Essential (primary) hypertension: Secondary | ICD-10-CM | POA: Diagnosis not present

## 2017-04-04 DIAGNOSIS — M8580 Other specified disorders of bone density and structure, unspecified site: Secondary | ICD-10-CM | POA: Diagnosis not present

## 2017-04-06 ENCOUNTER — Other Ambulatory Visit: Payer: Self-pay | Admitting: *Deleted

## 2017-04-06 NOTE — Patient Outreach (Signed)
[facility]) Care Management  04/06/2017  [place] Jul 25, 1960 [id number]   Covering for assigned care manager, R. Pierzchala.  Weekly transition of care call placed to member.  She state that she continues to improve, able to stand more and transfer to wheelchair with therapist.   She hope to be able to take some steps by next week.  Still awaiting response for her appeal, state it may take up to 30 days for response.  Denies any needs/concerns for this care manager at this time, state "it's just a waiting game."  Will provide update to assigned care manager for follow up next week.   [facility] CM Care Plan Problem One     Most Recent Value  Care Plan Problem One  Recent hospitalization due to MS exacerbation  Role Documenting the Problem One  Clinical Social Worker  Care Plan for Problem One  Active  [facility] CM Short Term Goal #1   Patient to verbalize the start of  in home personal care services within the next 30 days  [facility] CM Short Term Goal #1 Start Date  03/19/17  [facility] CM Short Term Goal #1 Met Date  04/06/17  Interventions for Short Term Goal #1  Patient referred to Homecare [facility] for in home personal care services, medication list to be fowarded.      Valente David, MSN, RN [facility] Care Management  [facility] Manager [phone]

## 2017-04-09 ENCOUNTER — Ambulatory Visit: Payer: Self-pay | Admitting: *Deleted

## 2017-04-10 ENCOUNTER — Other Ambulatory Visit: Payer: Self-pay | Admitting: *Deleted

## 2017-04-10 DIAGNOSIS — G35 Multiple sclerosis: Secondary | ICD-10-CM | POA: Diagnosis not present

## 2017-04-10 DIAGNOSIS — S62102D Fracture of unspecified carpal bone, left wrist, subsequent encounter for fracture with routine healing: Secondary | ICD-10-CM | POA: Diagnosis not present

## 2017-04-10 DIAGNOSIS — G609 Hereditary and idiopathic neuropathy, unspecified: Secondary | ICD-10-CM | POA: Diagnosis not present

## 2017-04-10 DIAGNOSIS — I10 Essential (primary) hypertension: Secondary | ICD-10-CM | POA: Diagnosis not present

## 2017-04-10 DIAGNOSIS — Z9181 History of falling: Secondary | ICD-10-CM | POA: Diagnosis not present

## 2017-04-10 DIAGNOSIS — M8580 Other specified disorders of bone density and structure, unspecified site: Secondary | ICD-10-CM | POA: Diagnosis not present

## 2017-04-10 NOTE — Patient Outreach (Signed)
9:29 am- Unsuccessful telephone [facility], 56 year [facility] female for transition of care/ongoing follow up on recent hospitalization October 29-November 7,2018 for Multiple sclerosis exacerbation.  HIPAA compliant voice message left with contact name and number.    9:32 am- Received a return phone call from pt to voice message left by RN CM, HIPAA identifiers provided by pt.  Pt reports going good,  up standing, able to transfer to wheelchair, currently sitting on the side of the bed, getting stronger.  Pt reports hearing date (appeal) is set for 04/24/17, [facility] recommends [facility] patient rehab - get better faster/mobility as HH PT only coming 3 times a week/short sessions.   Pt reports no recent or upcoming MD visits.    Plan:  As discussed with pt, plan to follow up again next week telephonically (part of ongoing transition of care.    Shayne Alkenose M.   Pierzchala RN CCM [facility] Care Management  [phone]

## 2017-04-10 NOTE — Patient Outreach (Signed)
[facility]) Care Management  04/10/2017  Jobeth Furtaw [place] 30-Mar-1961 [id number]  Collaboration phone call from Daphane Shepherd, medical social worker for [facility]. Per Gar Gibbon, patient would like her to submit a request for inpatient rehab due to progress made with outpatient PT. She was unsure if this could take place pending an existing appeal. It was confirmed through [facility] utilization management that she can submit the request on patient's behalf. [facility] utilization management will review the request at that time. Per Daphane Shepherd, she will submit the [facility] to the SNF who will then complete the authorization process.    [name] [facility] Care Management [phone]

## 2017-04-10 NOTE — Patient Outreach (Signed)
[facility]) Care Management  04/10/2017  Lisa Williams 07-16-1960 [id number]  Follow up phone call to patient regarding [facility] as well as progress in in home PT. Per patient, she is seeing some progression. She is able to stand with minimal assistance, she took a couple of steps on her own. She has also been able to transfer from the bed to the wheelchair. Per patient, PT is recommending inpatient rehab for faster progress. Patient also discussed that  [facility] has been helpful with ADL assistance. Per patient she has a hearing date on 04/24/17 to appeal denial for inpatient rehab. Patient's goal is to return back to her previous level of independence.  Plan: This Child psychotherapistsocial worker will follow up with patient after her hearing date to determine assistance needed.   Adriana ReamsChrystal Land, LCSW [facility] Care Management [phone]

## 2017-04-13 DIAGNOSIS — G35 Multiple sclerosis: Secondary | ICD-10-CM | POA: Diagnosis not present

## 2017-04-16 ENCOUNTER — Other Ambulatory Visit: Payer: Self-pay | Admitting: *Deleted

## 2017-04-16 NOTE — Patient Outreach (Signed)
Successful telephone encounter to Lisa Williams, 56 year old female for final transition of care call, ongoing follow up on recent hospitalization October 29-November 7,2018 for Multiple Sclerosis exacerbation.  Spoke with pt, HIPAA identifiers verified.  Pt reports mobility improving, continues to work with [facility] PT (3 times a week), taking steps going side to side next to bed with walker, able to transfer from bed to wheelchair, sitting up more in the wheelchair.   Pt reports HH PT said she is  doing good for being out of hospital for a month/not being active since September, working to get in  SNF for faster recovery.  Pt reports taking all of her medications, bowels good, no recent MD visits.    RN CM discussed with pt today final transition of care call, inquired about doing a home visit next month to which pt declined, did agreed to follow up call.   Plan:  As discussed with pt, plan to follow up again next month telephonically, check on clinical status.

## 2017-04-17 DIAGNOSIS — Z9181 History of falling: Secondary | ICD-10-CM | POA: Diagnosis not present

## 2017-04-17 DIAGNOSIS — G35 Multiple sclerosis: Secondary | ICD-10-CM | POA: Diagnosis not present

## 2017-04-17 DIAGNOSIS — S62102D Fracture of unspecified carpal bone, left wrist, subsequent encounter for fracture with routine healing: Secondary | ICD-10-CM | POA: Diagnosis not present

## 2017-04-17 DIAGNOSIS — I10 Essential (primary) hypertension: Secondary | ICD-10-CM | POA: Diagnosis not present

## 2017-04-17 DIAGNOSIS — G609 Hereditary and idiopathic neuropathy, unspecified: Secondary | ICD-10-CM | POA: Diagnosis not present

## 2017-04-17 DIAGNOSIS — M8580 Other specified disorders of bone density and structure, unspecified site: Secondary | ICD-10-CM | POA: Diagnosis not present

## 2017-04-23 DIAGNOSIS — Z8041 Family history of malignant neoplasm of ovary: Secondary | ICD-10-CM | POA: Diagnosis not present

## 2017-04-23 DIAGNOSIS — R5381 Other malaise: Secondary | ICD-10-CM | POA: Diagnosis not present

## 2017-04-23 DIAGNOSIS — Z5112 Encounter for antineoplastic immunotherapy: Secondary | ICD-10-CM | POA: Diagnosis not present

## 2017-04-23 DIAGNOSIS — N3281 Overactive bladder: Secondary | ICD-10-CM | POA: Diagnosis not present

## 2017-04-23 DIAGNOSIS — M549 Dorsalgia, unspecified: Secondary | ICD-10-CM | POA: Diagnosis not present

## 2017-04-23 DIAGNOSIS — R531 Weakness: Secondary | ICD-10-CM | POA: Diagnosis not present

## 2017-04-23 DIAGNOSIS — Z800 Family history of malignant neoplasm of digestive organs: Secondary | ICD-10-CM | POA: Diagnosis not present

## 2017-04-23 DIAGNOSIS — Z90710 Acquired absence of both cervix and uterus: Secondary | ICD-10-CM | POA: Diagnosis not present

## 2017-04-23 DIAGNOSIS — M25532 Pain in left wrist: Secondary | ICD-10-CM | POA: Diagnosis not present

## 2017-04-23 DIAGNOSIS — Z803 Family history of malignant neoplasm of breast: Secondary | ICD-10-CM | POA: Diagnosis not present

## 2017-04-23 DIAGNOSIS — G9009 Other idiopathic peripheral autonomic neuropathy: Secondary | ICD-10-CM | POA: Diagnosis not present

## 2017-04-23 DIAGNOSIS — Z8042 Family history of malignant neoplasm of prostate: Secondary | ICD-10-CM | POA: Diagnosis not present

## 2017-04-23 DIAGNOSIS — M6281 Muscle weakness (generalized): Secondary | ICD-10-CM | POA: Diagnosis not present

## 2017-04-23 DIAGNOSIS — R2689 Other abnormalities of gait and mobility: Secondary | ICD-10-CM | POA: Diagnosis not present

## 2017-04-23 DIAGNOSIS — G35 Multiple sclerosis: Secondary | ICD-10-CM | POA: Diagnosis not present

## 2017-04-23 DIAGNOSIS — G629 Polyneuropathy, unspecified: Secondary | ICD-10-CM | POA: Diagnosis not present

## 2017-04-23 DIAGNOSIS — Z87442 Personal history of urinary calculi: Secondary | ICD-10-CM | POA: Diagnosis not present

## 2017-04-23 DIAGNOSIS — I10 Essential (primary) hypertension: Secondary | ICD-10-CM | POA: Diagnosis not present

## 2017-04-23 DIAGNOSIS — E7800 Pure hypercholesterolemia, unspecified: Secondary | ICD-10-CM | POA: Diagnosis not present

## 2017-04-23 DIAGNOSIS — Z9181 History of falling: Secondary | ICD-10-CM | POA: Diagnosis not present

## 2017-04-23 DIAGNOSIS — M8580 Other specified disorders of bone density and structure, unspecified site: Secondary | ICD-10-CM | POA: Diagnosis not present

## 2017-04-23 DIAGNOSIS — N643 Galactorrhea not associated with childbirth: Secondary | ICD-10-CM | POA: Diagnosis not present

## 2017-04-23 DIAGNOSIS — K5900 Constipation, unspecified: Secondary | ICD-10-CM | POA: Diagnosis not present

## 2017-04-23 DIAGNOSIS — Z87891 Personal history of nicotine dependence: Secondary | ICD-10-CM | POA: Diagnosis not present

## 2017-04-23 DIAGNOSIS — Z87440 Personal history of urinary (tract) infections: Secondary | ICD-10-CM | POA: Diagnosis not present

## 2017-04-23 DIAGNOSIS — Z8781 Personal history of (healed) traumatic fracture: Secondary | ICD-10-CM | POA: Diagnosis not present

## 2017-04-25 ENCOUNTER — Ambulatory Visit: Payer: Self-pay | Admitting: *Deleted

## 2017-04-26 ENCOUNTER — Other Ambulatory Visit: Payer: Self-pay | Admitting: *Deleted

## 2017-04-26 NOTE — Patient Outreach (Signed)
[facility]) Care Management  04/26/2017  Lisa Williams October 19, 1960 [id number]   Follow up phone call to patient to assess for continued social work needs.  Patient states that she has been approved for rehab and  has been at [facility] since 04/23/17. Per patient, she is able to stand, slide her foot from side to side and backwards. She is also able to stand for 15 minutes in the stand lift chair. Per patient, she plans on staying in rehab for at least 4 weeks in order to gain optimal strength in her legs.   Plan: This social worker will plan to follow patient's progress while in rehab. Home visit to be scheduled for next week.   [name] [facility] Care Management [phone]

## 2017-05-08 NOTE — Progress Notes (Signed)
[place] [place] [facility] day:  05/09/2017  Chief Complaint: Lisa Williams is a 57 y.o. female with multiple sclerosis who seen for assessment prior to cycle #3 Rituxan.  HPI:  Patient was last seen in the hematology clinic for an initial visit on 09/06/2016 following a referral from Dr. Gurney Maxin. At that time, patient noted ambulation difficulties. She had numbness in her right foot and reported several falls. Outside labs from from [facility] were reviewed. Patient received cycle #1 Rituxan with plans to RTC in 2 weeks for a second infusion, prior to switching over to an every 6 month regimen.   Patient was seen in the ED on 01/16/2017 following a mechanical fall. She complained of LEFT wrist pain. Diagnostic plain films of the left wrist demonstrated a comminuted intra-articular fracture of the distal radius. Patient was placed in a temporary splint and admitted to the hospital for the fracture and a concurrent urinary tract infection. Patient underwent ORIF (volar plating) of the left wrist on 01/17/2017.  A cast was placed intra-operatively. Patient was discharged to [facility] on 01/18/2017 on a course of Ceftin for her UTI. PT/OT was ordered for mobility.   Patient was in the ED on 03/05/2017 for a MS flare. Patient noticed that she had "stressed out", which precipitated the flare. Patient complained of inability to bear weight, numbness in her bilateral lower extremities, a urinary incontinence. Dr. Alexis Goodell (neurology) was consulted, and patient was subsequently admitted for high-dose steroids treatments (1000 mg daily x 5: 03/05/2017 - 03/09/2017). Patient was discharged to her sister's home on 03/14/2017 with orders for home health PT, OT, nursing, and social work services.   In the interim, patient has transferred back to a SNF for rehabilitation. She currently resides at [facility] where she is  "learning to  walk again". She has been receiving PT and OT services since 04/23/2017. Patient notes that she is unable to stand or ambulate without assistance at this point. Her previously reported numbness in her RIGHT foot has resolved.  She denies any fevers, sweats, or recent illnesses. She is eating well. Her weight is down 7 pounds. Patient is scheduled to follow up with Dr. Melrose Nakayama or 05/21/2017.   Past Medical History:  Diagnosis Date  . Abdominal pain, right upper quadrant   . Back pain   . Calculus of kidney 12/09/2013  . Chronic back pain    unspecified  . Chronic left shoulder pain 07/19/2015  . Functional disorder of bladder    other  . Galactorrhea 11/26/2014   Chronic   . Hereditary and idiopathic neuropathy 08/19/2013  . HPV test positive   . Hypercholesteremia 08/19/2013  . Incomplete bladder emptying   . Microscopic hematuria   . MS (multiple sclerosis) ([place])   . Muscle spasticity 05/21/2014  . Nonspecific findings on examination of urine    other  . Osteopenia   . Status post laparoscopic supracervical hysterectomy 11/26/2014  . Tobacco user 11/26/2014  . Wrist fracture     Past Surgical History:  Procedure Laterality Date  . bilateral tubal ligation  1996  . BREAST CYST EXCISION Left 2002  . KNEE SURGERY     right  . LAPAROSCOPIC SUPRACERVICAL HYSTERECTOMY  08/05/2013  . ORIF WRIST FRACTURE Left 01/17/2017   Procedure: OPEN REDUCTION INTERNAL FIXATION (ORIF) WRIST FRACTURE;  Surgeon: Lovell Sheehan, MD;  Location: [facility] ORS;  Service: Orthopedics;  Laterality: Left;  . TUBAL LIGATION Bilateral   .  VAGINAL HYSTERECTOMY  03/2006    Family History  Problem Relation Age of Onset  . Sickle cell trait Sister   . Hypertension Sister   . Supraventricular tachycardia Sister   . Hypertension Sister   . Diabetes Maternal Grandmother   . Liver cancer Maternal Grandmother   . Diabetes Maternal Aunt   . Breast cancer Maternal Aunt        great MAT  . Ovarian cancer Paternal  Grandmother   . Nephrolithiasis Father   . Hypertension Father   . Prostate cancer Father   . Kidney disease Father   . Heart failure Father   . Hypertension Sister   . Osteoarthritis Mother   . Hypertension Mother   . Hyperlipidemia Mother   . GU problems Neg Hx   . Urolithiasis Neg Hx     Social History:  reports that she has quit smoking. Her smoking use included cigarettes. She smoked 0.50 packs per day. she has never used smokeless tobacco. She reports that she does not drink alcohol or use drugs.  She lives in [place].  She is disabled secondary to her multiple sclerosis.  She previously worked as a Wellsite geologist in Therapist, art for [employer].  She has been in the [facility] since 04/23/2017.  The patient is accompanied by her mother and sister today.  Allergies:  Allergies  Allergen Reactions  . Amlodipine Other (See Comments)    edema  . Povidone Iodine Rash    Current Medications: Current Outpatient Medications  Medication Sig Dispense Refill  . acetaminophen (TYLENOL) 325 MG tablet Take 650 mg by mouth every 8 (eight) hours as needed. Do not exceed 3000 mg in 24 hrs    . baclofen (LIORESAL) 20 MG tablet Take 20 mg by mouth every 8 (eight) hours as needed for muscle spasms.     . Calcium Carb-Cholecalciferol (CALCIUM 600+D) 600-800 MG-UNIT TABS Take 1 tablet by mouth daily.    Marland Kitchen losartan (COZAAR) 100 MG tablet Take 100 mg by mouth daily.     . Multiple Vitamin (MULTIVITAMIN) tablet Take 1 tablet by mouth daily.    Marland Kitchen oxybutynin (DITROPAN) 5 MG tablet Take 1 tablet (5 mg total) by mouth 2 (two) times daily.    Marland Kitchen RITUXIMAB IV Pt uses every 6 months due October 2018    . tiZANidine (ZANAFLEX) 4 MG tablet Take 4 mg by mouth every 6 (six) hours as needed for muscle spasms.     Marland Kitchen docusate sodium (COLACE) 100 MG capsule Take 100 mg by mouth daily as needed for mild constipation.    Marland Kitchen HYDROcodone-acetaminophen (NORCO) 5-325 MG tablet Take 1 tablet by  mouth every 6 (six) hours as needed for moderate pain. (Patient not taking: Reported on 03/16/2017) 120 tablet 0  . meloxicam (MOBIC) 15 MG tablet Take 1 tablet (15 mg total) by mouth 2 (two) times daily as needed for pain. (Patient not taking: Reported on 05/09/2017)  2  . metoCLOPramide (REGLAN) 10 MG tablet Take 10 mg by mouth every 8 (eight) hours as needed for nausea. If Zofran not effective    . polyethylene glycol (MIRALAX / GLYCOLAX) packet Take 17 g by mouth every other day as needed for mild constipation.      No current facility-administered medications for this visit.     Review of Systems:  GENERAL:  Feels "ok".  No fevers or sweats. Weight is down 7 pounds.  PERFORMANCE STATUS (ECOG):  3 HEENT:  No visual changes, runny  nose, sore throat, mouth sores or tenderness. Lungs: No shortness of breath or cough.  No hemoptysis. Cardiac:  No chest pain, palpitations, orthopnea, or PND. GI:  No nausea, vomiting, diarrhea, constipation, melena or hematochezia. GU:  No urgency, frequency, dysuria, or hematuria. Musculoskeletal:  No back pain.  No joint pain.  No muscle tenderness. Extremities:  No pain or swelling. Skin:  No rashes or skin changes. Neuro:  Difficulty walking.  Numb right foot.  No headache or visual changes. Endocrine:  No diabetes, thyroid issues, hot flashes or night sweats. Psych:  No mood changes, depression or anxiety. Pain:  No focal pain. Review of systems:  All other systems reviewed and found to be negative.  Physical Exam: Blood pressure (!) 169/81, pulse 75, temperature (!) 96.9 F (36.1 C), temperature source Tympanic, resp. rate 20, weight 232 lb (105.2 kg). GENERAL:  Well developed, well nourished, woman sitting comfortably in the exam room in a wheelchair in no acute distress. MENTAL STATUS:  Alert and oriented to person, place and time. HEAD:  Wearing a cap.  Short brown styled hair.  Normocephalic, atraumatic, face symmetric, no Cushingoid  features. EYES:  Glasses.  Brown eyes.  Pupils equal round and reactive to light and accomodation.  No conjunctivitis or scleral icterus. ENT:  Oropharynx clear without lesion.  Tongue normal. Mucous membranes moist.  RESPIRATORY:  Clear to auscultation without rales, wheezes or rhonchi. CARDIOVASCULAR:  Regular rate and rhythm without murmur, rub or gallop. ABDOMEN:  Soft, non-tender, with active bowel sounds, and no hepatosplenomegaly.  No masses. SKIN:  No rashes, ulcers or lesions. EXTREMITIES: No edema, no skin discoloration or tenderness.  No palpable cords. LYMPH NODES: No palpable cervical, supraclavicular, axillary or inguinal adenopathy  NEUROLOGICAL:  Alert & oriented, cranial nerves II-XII intact; motor strength 5/5 upper extremity.  Unable to lift legs against gravity.; sensation intact; finger to nose and RAM normal.  Upper extremity reflexes 3+.  PSYCH:  Appropriate.   No visits with results within 3 Day(s) from this visit.  Latest known visit with results is:  Admission on 03/05/2017, Discharged on 03/14/2017  Component Date Value Ref Range Status  . Sodium 03/05/2017 138  135 - 145 mmol/L Final  . Potassium 03/05/2017 4.4  3.5 - 5.1 mmol/L Final  . Chloride 03/05/2017 105  101 - 111 mmol/L Final  . CO2 03/05/2017 25  22 - 32 mmol/L Final  . Glucose, Bld 03/05/2017 112* 65 - 99 mg/dL Final  . BUN 03/05/2017 26* 6 - 20 mg/dL Final  . Creatinine, Ser 03/05/2017 0.72  0.44 - 1.00 mg/dL Final  . Calcium 03/05/2017 9.5  8.9 - 10.3 mg/dL Final  . GFR calc non Af Amer 03/05/2017 >60  >60 mL/min Final  . GFR calc Af Amer 03/05/2017 >60  >60 mL/min Final   Comment: (NOTE) The eGFR has been calculated using the CKD EPI equation. This calculation has not been validated in all clinical situations. eGFR's persistently <60 mL/min signify possible Chronic Kidney Disease.   . Anion gap 03/05/2017 8  5 - 15 Final  . WBC 03/05/2017 8.8  3.6 - 11.0 K/uL Final  . RBC 03/05/2017 4.88   3.80 - 5.20 MIL/uL Final  . Hemoglobin 03/05/2017 13.0  12.0 - 16.0 g/dL Final  . HCT 03/05/2017 38.6  35.0 - 47.0 % Final  . MCV 03/05/2017 79.1* 80.0 - 100.0 fL Final  . MCH 03/05/2017 26.7  26.0 - 34.0 pg Final  . MCHC 03/05/2017 33.8  32.0 -  36.0 g/dL Final  . RDW 03/05/2017 12.9  11.5 - 14.5 % Final  . Platelets 03/05/2017 232  150 - 440 K/uL Final  . Neutrophils Relative % 03/05/2017 65  % Final  . Neutro Abs 03/05/2017 5.7  1.4 - 6.5 K/uL Final  . Lymphocytes Relative 03/05/2017 23  % Final  . Lymphs Abs 03/05/2017 2.0  1.0 - 3.6 K/uL Final  . Monocytes Relative 03/05/2017 10  % Final  . Monocytes Absolute 03/05/2017 0.8  0.2 - 0.9 K/uL Final  . Eosinophils Relative 03/05/2017 1  % Final  . Eosinophils Absolute 03/05/2017 0.1  0 - 0.7 K/uL Final  . Basophils Relative 03/05/2017 1  % Final  . Basophils Absolute 03/05/2017 0.1  0 - 0.1 K/uL Final  . Sodium 03/06/2017 138  135 - 145 mmol/L Final  . Potassium 03/06/2017 3.9  3.5 - 5.1 mmol/L Final  . Chloride 03/06/2017 107  101 - 111 mmol/L Final  . CO2 03/06/2017 25  22 - 32 mmol/L Final  . Glucose, Bld 03/06/2017 134* 65 - 99 mg/dL Final  . BUN 03/06/2017 23* 6 - 20 mg/dL Final  . Creatinine, Ser 03/06/2017 0.53  0.44 - 1.00 mg/dL Final  . Calcium 03/06/2017 9.0  8.9 - 10.3 mg/dL Final  . GFR calc non Af Amer 03/06/2017 >60  >60 mL/min Final  . GFR calc Af Amer 03/06/2017 >60  >60 mL/min Final   Comment: (NOTE) The eGFR has been calculated using the CKD EPI equation. This calculation has not been validated in all clinical situations. eGFR's persistently <60 mL/min signify possible Chronic Kidney Disease.   . Anion gap 03/06/2017 6  5 - 15 Final  . WBC 03/06/2017 7.6  3.6 - 11.0 K/uL Final  . RBC 03/06/2017 4.64  3.80 - 5.20 MIL/uL Final  . Hemoglobin 03/06/2017 12.5  12.0 - 16.0 g/dL Final  . HCT 03/06/2017 36.9  35.0 - 47.0 % Final  . MCV 03/06/2017 79.6* 80.0 - 100.0 fL Final  . MCH 03/06/2017 26.9  26.0 - 34.0 pg  Final  . MCHC 03/06/2017 33.8  32.0 - 36.0 g/dL Final  . RDW 03/06/2017 13.4  11.5 - 14.5 % Final  . Platelets 03/06/2017 213  150 - 440 K/uL Final  . Glucose-Capillary 03/05/2017 137* 65 - 99 mg/dL Final  . Glucose-Capillary 03/06/2017 131* 65 - 99 mg/dL Final  . Glucose-Capillary 03/06/2017 121* 65 - 99 mg/dL Final  . Glucose-Capillary 03/06/2017 115* 65 - 99 mg/dL Final  . Glucose-Capillary 03/07/2017 119* 65 - 99 mg/dL Final  . Glucose-Capillary 03/07/2017 112* 65 - 99 mg/dL Final  . Glucose-Capillary 03/09/2017 112* 65 - 99 mg/dL Final  . Glucose-Capillary 03/08/2017 183* 65 - 99 mg/dL Final  . Glucose-Capillary 03/09/2017 133* 65 - 99 mg/dL Final  . Creatinine, Ser 03/10/2017 0.63  0.44 - 1.00 mg/dL Final  . GFR calc non Af Amer 03/10/2017 >60  >60 mL/min Final  . GFR calc Af Amer 03/10/2017 >60  >60 mL/min Final   Comment: (NOTE) The eGFR has been calculated using the CKD EPI equation. This calculation has not been validated in all clinical situations. eGFR's persistently <60 mL/min signify possible Chronic Kidney Disease.   . Glucose-Capillary 03/09/2017 99  65 - 99 mg/dL Final  . Creatinine, Ser 03/12/2017 0.67  0.44 - 1.00 mg/dL Final  . GFR calc non Af Amer 03/12/2017 >60  >60 mL/min Final  . GFR calc Af Amer 03/12/2017 >60  >60 mL/min Final   Comment: (  NOTE) The eGFR has been calculated using the CKD EPI equation. This calculation has not been validated in all clinical situations. eGFR's persistently <60 mL/min signify possible Chronic Kidney Disease.     Assessment:  Lisa Williams is a 57 y.o. female with multiple sclerosis diagnosed in 1995.  She was previously treated with Copaxone. . Hepatitis B core antibody total and hepatitis B surface antibody quantitative was negative on 07/14/2016.  Hepatitis testing on 12/16/2015 was negative for the following: Hepatitis B surface antigen, hepatitis Be antigen, hepatitis B core IgM antibody, hepatitis B core total  antibody, hepatitis B E antibody, hepatitis B surface antibody, and hepatitis C antibody.  She has received Rituxan (09/06/2016 - 09/20/2016).  She had an MS flare and received Solumedrol 1000 mg daily x 5 (03/05/2017 - 03/09/2017).  She is currently resides at [facility].  Symptomatically, she has lower extremity weakness. Exam is otherwise stable.   Plan: 1.  Rituxan today. 2.  Contact Dr. Lannie Fields office secondary to recent MS flare and treatment with high dose steroids- done. 3.  RTC in 6 months for MD assessment and 4th Rituxan infusion.    Honor Loh, NP  05/09/2017, 10:50 AM   I saw and evaluated the patient, participating in the key portions of the service and reviewing pertinent diagnostic studies and records.  I reviewed the nurse practitioner's note and agree with the findings and the plan.  The assessment and plan were discussed with the patient.  A few questions were asked by the patient and answered.   Nolon Stalls, MD 05/09/2017, 10:50 AM

## 2017-05-09 ENCOUNTER — Inpatient Hospital Stay: Payer: PPO | Attending: Hematology and Oncology | Admitting: Hematology and Oncology

## 2017-05-09 ENCOUNTER — Other Ambulatory Visit: Payer: Self-pay | Admitting: Hematology and Oncology

## 2017-05-09 ENCOUNTER — Ambulatory Visit: Payer: Self-pay | Admitting: *Deleted

## 2017-05-09 ENCOUNTER — Inpatient Hospital Stay: Payer: PPO

## 2017-05-09 VITALS — BP 169/81 | HR 75 | Temp 96.9°F | Resp 20 | Wt 232.0 lb

## 2017-05-09 VITALS — BP 118/73 | HR 78 | Temp 96.9°F | Resp 20

## 2017-05-09 DIAGNOSIS — M549 Dorsalgia, unspecified: Secondary | ICD-10-CM | POA: Insufficient documentation

## 2017-05-09 DIAGNOSIS — Z9181 History of falling: Secondary | ICD-10-CM | POA: Diagnosis not present

## 2017-05-09 DIAGNOSIS — Z87440 Personal history of urinary (tract) infections: Secondary | ICD-10-CM | POA: Diagnosis not present

## 2017-05-09 DIAGNOSIS — Z90710 Acquired absence of both cervix and uterus: Secondary | ICD-10-CM

## 2017-05-09 DIAGNOSIS — G629 Polyneuropathy, unspecified: Secondary | ICD-10-CM | POA: Diagnosis not present

## 2017-05-09 DIAGNOSIS — R531 Weakness: Secondary | ICD-10-CM | POA: Diagnosis not present

## 2017-05-09 DIAGNOSIS — M25532 Pain in left wrist: Secondary | ICD-10-CM | POA: Insufficient documentation

## 2017-05-09 DIAGNOSIS — N643 Galactorrhea not associated with childbirth: Secondary | ICD-10-CM | POA: Insufficient documentation

## 2017-05-09 DIAGNOSIS — Z800 Family history of malignant neoplasm of digestive organs: Secondary | ICD-10-CM | POA: Diagnosis not present

## 2017-05-09 DIAGNOSIS — G35 Multiple sclerosis: Secondary | ICD-10-CM

## 2017-05-09 DIAGNOSIS — Z87891 Personal history of nicotine dependence: Secondary | ICD-10-CM

## 2017-05-09 DIAGNOSIS — Z8042 Family history of malignant neoplasm of prostate: Secondary | ICD-10-CM | POA: Insufficient documentation

## 2017-05-09 DIAGNOSIS — E7800 Pure hypercholesterolemia, unspecified: Secondary | ICD-10-CM | POA: Diagnosis not present

## 2017-05-09 DIAGNOSIS — M8580 Other specified disorders of bone density and structure, unspecified site: Secondary | ICD-10-CM | POA: Diagnosis not present

## 2017-05-09 DIAGNOSIS — Z803 Family history of malignant neoplasm of breast: Secondary | ICD-10-CM | POA: Insufficient documentation

## 2017-05-09 DIAGNOSIS — Z5112 Encounter for antineoplastic immunotherapy: Secondary | ICD-10-CM | POA: Insufficient documentation

## 2017-05-09 DIAGNOSIS — Z87442 Personal history of urinary calculi: Secondary | ICD-10-CM

## 2017-05-09 DIAGNOSIS — Z8041 Family history of malignant neoplasm of ovary: Secondary | ICD-10-CM | POA: Diagnosis not present

## 2017-05-09 DIAGNOSIS — Z8781 Personal history of (healed) traumatic fracture: Secondary | ICD-10-CM | POA: Diagnosis not present

## 2017-05-09 MED ORDER — ACETAMINOPHEN 500 MG PO TABS
1000.0000 mg | ORAL_TABLET | Freq: Once | ORAL | Status: AC
  Administered 2017-05-09: 1000 mg via ORAL

## 2017-05-09 MED ORDER — DIPHENHYDRAMINE HCL 25 MG PO CAPS
50.0000 mg | ORAL_CAPSULE | Freq: Once | ORAL | Status: AC
  Administered 2017-05-09: 50 mg via ORAL

## 2017-05-09 MED ORDER — METHYLPREDNISOLONE SODIUM SUCC 125 MG IJ SOLR
100.0000 mg | Freq: Once | INTRAMUSCULAR | Status: AC
  Administered 2017-05-09: 100 mg via INTRAVENOUS

## 2017-05-09 MED ORDER — SODIUM CHLORIDE 0.9 % IV SOLN
425.0000 mg/m2 | Freq: Once | INTRAVENOUS | Status: AC
  Administered 2017-05-09: 1000 mg via INTRAVENOUS
  Filled 2017-05-09: qty 100

## 2017-05-09 MED ORDER — SODIUM CHLORIDE 0.9 % IV SOLN
Freq: Once | INTRAVENOUS | Status: AC
  Administered 2017-05-09: 11:00:00 via INTRAVENOUS
  Filled 2017-05-09: qty 1000

## 2017-05-09 NOTE — Patient Instructions (Signed)
Rituximab injection  What is this medicine?  RITUXIMAB (ri TUX i mab) is a monoclonal antibody. It is used to treat certain types of cancer like non-Hodgkin lymphoma and chronic lymphocytic leukemia. It is also used to treat rheumatoid arthritis, granulomatosis with polyangiitis (or Wegener's granulomatosis), and microscopic polyangiitis.  This medicine may be used for other purposes; ask your health care provider or pharmacist if you have questions.  COMMON BRAND NAME(S): Rituxan  What should I tell my health care provider before I take this medicine?  They need to know if you have any of these conditions:  -heart disease  -infection (especially a virus infection such as hepatitis B, chickenpox, cold sores, or herpes)  -immune system problems  -irregular heartbeat  -kidney disease  -lung or breathing disease, like asthma  -recently received or scheduled to receive a vaccine  -an unusual or allergic reaction to rituximab, mouse proteins, other medicines, foods, dyes, or preservatives  -pregnant or trying to get pregnant  -breast-feeding  How should I use this medicine?  This medicine is for infusion into a vein. It is administered in a hospital or clinic by a specially trained health care professional.  A special MedGuide will be given to you by the pharmacist with each prescription and refill. Be sure to read this information carefully each time.  Talk to your pediatrician regarding the use of this medicine in children. This medicine is not approved for use in children.  Overdosage: If you think you have taken too much of this medicine contact a poison control center or emergency room at once.  NOTE: This medicine is only for you. Do not share this medicine with others.  What if I miss a dose?  It is important not to miss a dose. Call your doctor or health care professional if you are unable to keep an appointment.  What may interact with this medicine?  -cisplatin  -other medicines for arthritis like disease  modifying antirheumatic drugs or tumor necrosis factor inhibitors  -live virus vaccines  This list may not describe all possible interactions. Give your health care provider a list of all the medicines, herbs, non-prescription drugs, or dietary supplements you use. Also tell them if you smoke, drink alcohol, or use illegal drugs. Some items may interact with your medicine.  What should I watch for while using this medicine?  Your condition will be monitored carefully while you are receiving this medicine. You may need blood work done while you are taking this medicine.  This medicine can cause serious allergic reactions. To reduce your risk you may need to take medicine before treatment with this medicine. Take your medicine as directed.  In some patients, this medicine may cause a serious brain infection that may cause death. If you have any problems seeing, thinking, speaking, walking, or standing, tell your doctor right away. If you cannot reach your doctor, urgently seek other source of medical care.  Call your doctor or health care professional for advice if you get a fever, chills or sore throat, or other symptoms of a cold or flu. Do not treat yourself. This drug decreases your body's ability to fight infections. Try to avoid being around people who are sick.  Do not become pregnant while taking this medicine or for 12 months after stopping it. Women should inform their doctor if they wish to become pregnant or think they might be pregnant. There is a potential for serious side effects to an unborn child. Talk to your health   care professional or pharmacist for more information.  What side effects may I notice from receiving this medicine?  Side effects that you should report to your doctor or health care professional as soon as possible:  -breathing problems  -chest pain  -dizziness or feeling faint  -fast, irregular heartbeat  -low blood counts - this medicine may decrease the number of white blood cells,  red blood cells and platelets. You may be at increased risk for infections and bleeding.  -mouth sores  -redness, blistering, peeling or loosening of the skin, including inside the mouth (this can be added for any serious or exfoliative rash that could lead to hospitalization)  -signs of infection - fever or chills, cough, sore throat, pain or difficulty passing urine  -signs and symptoms of kidney injury like trouble passing urine or change in the amount of urine  -signs and symptoms of liver injury like dark yellow or brown urine; general ill feeling or flu-like symptoms; light-colored stools; loss of appetite; nausea; right upper belly pain; unusually weak or tired; yellowing of the eyes or skin  -stomach pain  -vomiting  Side effects that usually do not require medical attention (report to your doctor or health care professional if they continue or are bothersome):  -headache  -joint pain  -muscle cramps or muscle pain  This list may not describe all possible side effects. Call your doctor for medical advice about side effects. You may report side effects to FDA at 1-800-FDA-1088.  Where should I keep my medicine?  This drug is given in a hospital or clinic and will not be stored at home.  NOTE: This sheet is a summary. It may not cover all possible information. If you have questions about this medicine, talk to your doctor, pharmacist, or health care provider.   2018 Elsevier/Gold Standard (2015-12-01 15:28:09)

## 2017-05-09 NOTE — Progress Notes (Signed)
Patient's BP is elevated today - she states she is upset because her transportation here was late picking her up.  I did call Dr. Daisy Blossom office per Dr. Merlene Pulling to inquire if he is in agreement with patient receiving her Rituxan today after having been on Solumedrol for 5 days.  Dr. [name] is out of the office but spoke to Dr. Margaretmary Eddy nurse who asked him and reported back to me that he is in agreement.

## 2017-05-10 ENCOUNTER — Other Ambulatory Visit: Payer: Self-pay | Admitting: *Deleted

## 2017-05-10 NOTE — Patient Outreach (Signed)
[facility]) Care Management  05/10/2017  Lisa Williams [name] 1960/12/15 [id number]   Phone call to the discharge planner at [facility]. Spoke with Synetta Fail who states that patient has been denied continued stay in rehab. Patient lost her first appeal and is now waiting on determination for the second appeal. Per Synetta Fail, patient will remain in rehab until a decision is made.   Plan: This Child psychotherapist will follow up with patient within 1 week regarding her rehab stay and discharge planning needs,   [name] [facility] Care Management [phone]

## 2017-05-15 DIAGNOSIS — M8580 Other specified disorders of bone density and structure, unspecified site: Secondary | ICD-10-CM | POA: Diagnosis not present

## 2017-05-15 DIAGNOSIS — G35 Multiple sclerosis: Secondary | ICD-10-CM | POA: Diagnosis not present

## 2017-05-15 DIAGNOSIS — N3281 Overactive bladder: Secondary | ICD-10-CM | POA: Diagnosis not present

## 2017-05-15 DIAGNOSIS — I10 Essential (primary) hypertension: Secondary | ICD-10-CM | POA: Diagnosis not present

## 2017-05-15 DIAGNOSIS — R5381 Other malaise: Secondary | ICD-10-CM | POA: Diagnosis not present

## 2017-05-16 ENCOUNTER — Other Ambulatory Visit: Payer: Self-pay | Admitting: *Deleted

## 2017-05-16 NOTE — Patient Outreach (Signed)
Unsuccessful telephone encounter to [facility], 57 year old female- follow up on current clinical status. This RN CM followed pt for Transition of care/recent hospitalization October 29-November 7,2018 for MS exacerbation, Transition of program completed 04/16/17.    View in EMR today-  Chrystal THN LCSW been following pt at  [facility] Care/informed by pt admitted  December 17,2018.   HIPAA compliant voice message left on pt's mobile number with RN CM's contact name and number..    Plan:  RN CM to collaborate with Chrystal THN LCSW, follow up with pt post SNF  Discharge.    Shayne Alken.   Hailyn Zarr RN CCM [facility] Care Management  [phone]

## 2017-05-16 NOTE — Patient Outreach (Signed)
3:00 pm - Received a return  Phone call from pt to voice message left earlier.  Pt reports  still at [facility], HIPAA identifiers verified during call.  Pt reports been at SNF since 04/23/17, her insurance stopped paying after 05/06/17, working on third appeal.  Pt reports doing good, did her first walking today 25 feet with physical therapist on each side.    Discussed with pt to collaborate with Chrystal THN LCSW, follow up with her when discharged home.   Plan: As discussed with pt, to follow up when discharged home from SNF.   Shayne Alken.   Pierzchala RN CCM [facility] Care Management  [phone]

## 2017-05-16 NOTE — Patient Outreach (Signed)
[facility]) Care Management  05/16/2017  Lisa Williams [birth year] [id number]  Phone call to patient on 05/15/17 to follow up on her progress while in rehab. Per patient, she remains active in rehab at [facility], however is on her 3rd level of appeal. Patient states that she feels she is making progress. She is standing for longer periods of time and is taking some steps with staff assistance. Patient plans to remain in rehab and will continue with the appeals process.  Plan: This Child psychotherapist has scheduled a home visit    [name] [facility] Care Management [phone]

## 2017-05-17 ENCOUNTER — Other Ambulatory Visit: Payer: Self-pay | Admitting: *Deleted

## 2017-05-17 NOTE — Patient Outreach (Signed)
[facility]) Care Management  [facility] Social Work  05/17/2017  Lisa Williams 1960-06-09 [id number]  Subjective: Patient is a 57 year old female currently in rehab at [facility]. Admitted from home. Patient reports that with daily rehab she is walking now with moderate assist. Patient motivated for rehab and feels that her mobility will continue to improve with daily PT and OT.  Patient states that she tries to be up and dressed by 6am to be ready for PT. Patient approved from 04/23/17-05/06/17, however remains in rehab. Patient  has  filed a second level appeal to continue her stay.    Objective:   Encounter Medications:  Outpatient Encounter Medications as of 05/17/2017  Medication Sig Note  . acetaminophen (TYLENOL) 325 MG tablet Take 650 mg by mouth every 8 (eight) hours as needed. Do not exceed 3000 mg in 24 hrs 03/19/2017: As needed.   . baclofen (LIORESAL) 20 MG tablet Take 20 mg by mouth every 8 (eight) hours as needed for muscle spasms.  03/19/2017: Pt takes 3 times a day   . Calcium Carb-Cholecalciferol (CALCIUM 600+D) 600-800 MG-UNIT TABS Take 1 tablet by mouth daily.   Marland Kitchen docusate sodium (COLACE) 100 MG capsule Take 100 mg by mouth daily as needed for mild constipation.   Marland Kitchen HYDROcodone-acetaminophen (NORCO) 5-325 MG tablet Take 1 tablet by mouth every 6 (six) hours as needed for moderate pain. (Patient not taking: Reported on 03/16/2017)   . losartan (COZAAR) 100 MG tablet Take 100 mg by mouth daily.    . meloxicam (MOBIC) 15 MG tablet Take 1 tablet (15 mg total) by mouth 2 (two) times daily as needed for pain. (Patient not taking: Reported on 05/09/2017) 03/19/2017: As needed.   . metoCLOPramide (REGLAN) 10 MG tablet Take 10 mg by mouth every 8 (eight) hours as needed for nausea. If Zofran not effective   . Multiple Vitamin (MULTIVITAMIN) tablet Take 1 tablet by mouth daily.   Marland Kitchen oxybutynin (DITROPAN) 5 MG tablet Take 1 tablet (5 mg total) by mouth 2 (two)  times daily.   . polyethylene glycol (MIRALAX / GLYCOLAX) packet Take 17 g by mouth every other day as needed for mild constipation.    Marland Kitchen RITUXIMAB IV Pt uses every 6 months due October 2018 03/19/2017: Pt takes on an outpatient   . tiZANidine (ZANAFLEX) 4 MG tablet Take 4 mg by mouth every 6 (six) hours as needed for muscle spasms.  03/19/2017: As needed.    No facility-administered encounter medications on file as of 05/17/2017.     Functional Status:  In your present state of health, do you have any difficulty performing the following activities: 03/19/2017 03/05/2017  Hearing? N N  Vision? N N  Difficulty concentrating or making decisions? N N  Walking or climbing stairs? Y Y  Dressing or bathing? Y N  Doing errands, shopping? Lisa Williams  Preparing Food and eating ? Y -  Using the Toilet? Y -  In the past six months, have you accidently leaked urine? N -  Do you have problems with loss of bowel control? N -  Managing your Medications? Y -  Managing your Finances? Y -  Housekeeping or managing your Housekeeping? N -  Some recent data might be hidden    Fall/Depression Screening:  PHQ 2/9 Scores 03/19/2017  PHQ - 2 Score 0    Assessment: Per discharge planner, no discharge date has been set yet. Patient on second level appeal and plans on being in  rehab for at least 1 1/2 months. Patient very motivated for treatment and positive that her mobility will increase with daily inpatient treatment. Patient's sister and mother very supportive and were present during visit.   Plan: This social worker to continue to follow patient's progress while in rehab. RNCM informed about patient's progress.   [name] [facility] Care Management [phone]

## 2017-05-18 DIAGNOSIS — R5381 Other malaise: Secondary | ICD-10-CM | POA: Diagnosis not present

## 2017-05-18 DIAGNOSIS — G35 Multiple sclerosis: Secondary | ICD-10-CM | POA: Diagnosis not present

## 2017-05-27 ENCOUNTER — Encounter: Payer: Self-pay | Admitting: Hematology and Oncology

## 2017-05-31 ENCOUNTER — Other Ambulatory Visit: Payer: Self-pay | Admitting: *Deleted

## 2017-05-31 ENCOUNTER — Ambulatory Visit: Payer: Self-pay | Admitting: *Deleted

## 2017-05-31 NOTE — Patient Outreach (Signed)
[facility]) Care Management  05/31/2017  Lisa Williams Sep 21, 1960 [id number]   Phone call from patient stating that she will be discharging from [facility] on 06/15/17. Per patient, she lost her third level of appeal, however she filed a financial appeal through the Department of health and Human Services that will allow her to remain at the facility until her next level appeal due to financial hardship. Per patient, she continues to be motivated in her participation in PT.   [name] [facility] Care Management [phone]

## 2017-06-01 DIAGNOSIS — I10 Essential (primary) hypertension: Secondary | ICD-10-CM | POA: Diagnosis not present

## 2017-06-01 DIAGNOSIS — N3281 Overactive bladder: Secondary | ICD-10-CM | POA: Diagnosis not present

## 2017-06-01 DIAGNOSIS — R5381 Other malaise: Secondary | ICD-10-CM | POA: Diagnosis not present

## 2017-06-01 DIAGNOSIS — G35 Multiple sclerosis: Secondary | ICD-10-CM | POA: Diagnosis not present

## 2017-06-01 DIAGNOSIS — M8580 Other specified disorders of bone density and structure, unspecified site: Secondary | ICD-10-CM | POA: Diagnosis not present

## 2017-06-02 ENCOUNTER — Other Ambulatory Visit: Payer: Self-pay | Admitting: *Deleted

## 2017-06-02 NOTE — Patient Outreach (Signed)
[facility]) Care Management  06/02/2017  Lisa Williams 1960-09-12 [id number]   Phone call on 06/01/17 from patient stating that she will be discharging toomorrow instead of 06/15/17. Per patient she lost her second level appeal and  Is now on her 3rd, however she was informed that if she remains in the facility she would not be provided PT, despite the additional filing of a financial appeal.  Per patient, she has decided to return home with [facility]. Patient states that she feels that she hs made progress, she is able to stand and walk with her walker(stand by assistance) Patient also still using her wheelchair. Patient declined need for Homecare Providers at this time, stating that she thinks she will be able to take care of her own ADL's. [facility] will transport patient home from the hospital  This social worker will follow up with patient once she returns home to assess for community resource needs. This social worker will inform RNCM of patient's discharge for transition of care.   [name] [facility] Care Management [phone]

## 2017-06-04 ENCOUNTER — Other Ambulatory Visit: Payer: Self-pay | Admitting: *Deleted

## 2017-06-04 NOTE — Progress Notes (Signed)
This encounter was created in error - please disregard.

## 2017-06-04 NOTE — Patient Outreach (Signed)
06/04/17  9:47 am  Unsuccessful telephone encounter to [facility], 86 year of female for transition of care/recent SNF discharge as this RN CM was informed by Chrystal THN LCSW pt to discharge home 06/02/17 from [facility].  Followed pt for transition of care/recent hospitalization October 29-November 7,2018 for MS exacerbation, Transition care program completed 04/16/17.   Pt then admitted to [facility] December 17,2018.   HIPAA compliant voice message left with RN CM's contact information.   9:49am-  Received a return phone call from pt to voice message left, spoke with pt, HIPAA identifiers verified.   Pt reports doing well since discharge home, walking with her walker/still needing a little assistance standing.   Pt reports lost her second appeal but still in the appeal process.  Pt reports it helped to stay at SNF, up to 85% In her walking/able to ambulate 60 feet, feel more comfortable discharging home this time.   Pt reports [facility] was ordered, has not heard from them yet, has contact number to call if needed.   Pt reports no  Medication changes while at SNF, taking all medications as ordered.  ** Patient was recently discharged from  hospital and all medications have been reviewed.   MD follow up appointment- per pt to wait until doing better, schedule then PCP follow up appointment.  RN CM discussed with pt plan to follow again for Transition of care - 31 days (weekly calls, home visit) to which  Pt was agreeable to calls, did not feel needed a home visit.   Plan:  As discussed with pt, plan to follow again next week telephonically- part of ongoing transition of care.           Plan to inform PCP following pt for transition of care- send update letter.   Shayne Alken.   Pierzchala RN CCM [facility] Care Management  [phone]

## 2017-06-05 ENCOUNTER — Encounter: Payer: Self-pay | Admitting: *Deleted

## 2017-06-05 DIAGNOSIS — S62102D Fracture of unspecified carpal bone, left wrist, subsequent encounter for fracture with routine healing: Secondary | ICD-10-CM | POA: Diagnosis not present

## 2017-06-05 DIAGNOSIS — I10 Essential (primary) hypertension: Secondary | ICD-10-CM | POA: Diagnosis not present

## 2017-06-05 DIAGNOSIS — Z993 Dependence on wheelchair: Secondary | ICD-10-CM | POA: Diagnosis not present

## 2017-06-05 DIAGNOSIS — Z9181 History of falling: Secondary | ICD-10-CM | POA: Diagnosis not present

## 2017-06-05 DIAGNOSIS — G609 Hereditary and idiopathic neuropathy, unspecified: Secondary | ICD-10-CM | POA: Diagnosis not present

## 2017-06-05 DIAGNOSIS — G35 Multiple sclerosis: Secondary | ICD-10-CM | POA: Diagnosis not present

## 2017-06-05 DIAGNOSIS — M8580 Other specified disorders of bone density and structure, unspecified site: Secondary | ICD-10-CM | POA: Diagnosis not present

## 2017-06-05 DIAGNOSIS — Z87891 Personal history of nicotine dependence: Secondary | ICD-10-CM | POA: Diagnosis not present

## 2017-06-05 DIAGNOSIS — M62838 Other muscle spasm: Secondary | ICD-10-CM | POA: Diagnosis not present

## 2017-06-06 ENCOUNTER — Ambulatory Visit: Payer: Self-pay | Admitting: *Deleted

## 2017-06-08 ENCOUNTER — Ambulatory Visit: Payer: Self-pay | Admitting: *Deleted

## 2017-06-08 DIAGNOSIS — Z993 Dependence on wheelchair: Secondary | ICD-10-CM | POA: Diagnosis not present

## 2017-06-08 DIAGNOSIS — Z9181 History of falling: Secondary | ICD-10-CM | POA: Diagnosis not present

## 2017-06-08 DIAGNOSIS — G609 Hereditary and idiopathic neuropathy, unspecified: Secondary | ICD-10-CM | POA: Diagnosis not present

## 2017-06-08 DIAGNOSIS — S62102D Fracture of unspecified carpal bone, left wrist, subsequent encounter for fracture with routine healing: Secondary | ICD-10-CM | POA: Diagnosis not present

## 2017-06-08 DIAGNOSIS — M8580 Other specified disorders of bone density and structure, unspecified site: Secondary | ICD-10-CM | POA: Diagnosis not present

## 2017-06-08 DIAGNOSIS — M62838 Other muscle spasm: Secondary | ICD-10-CM | POA: Diagnosis not present

## 2017-06-08 DIAGNOSIS — G35 Multiple sclerosis: Secondary | ICD-10-CM | POA: Diagnosis not present

## 2017-06-08 DIAGNOSIS — I10 Essential (primary) hypertension: Secondary | ICD-10-CM | POA: Diagnosis not present

## 2017-06-08 DIAGNOSIS — Z87891 Personal history of nicotine dependence: Secondary | ICD-10-CM | POA: Diagnosis not present

## 2017-06-11 ENCOUNTER — Ambulatory Visit: Payer: Self-pay | Admitting: *Deleted

## 2017-06-11 DIAGNOSIS — S62102D Fracture of unspecified carpal bone, left wrist, subsequent encounter for fracture with routine healing: Secondary | ICD-10-CM | POA: Diagnosis not present

## 2017-06-11 DIAGNOSIS — I10 Essential (primary) hypertension: Secondary | ICD-10-CM | POA: Diagnosis not present

## 2017-06-11 DIAGNOSIS — Z9181 History of falling: Secondary | ICD-10-CM | POA: Diagnosis not present

## 2017-06-11 DIAGNOSIS — M8580 Other specified disorders of bone density and structure, unspecified site: Secondary | ICD-10-CM | POA: Diagnosis not present

## 2017-06-11 DIAGNOSIS — Z87891 Personal history of nicotine dependence: Secondary | ICD-10-CM | POA: Diagnosis not present

## 2017-06-11 DIAGNOSIS — G35 Multiple sclerosis: Secondary | ICD-10-CM | POA: Diagnosis not present

## 2017-06-11 DIAGNOSIS — Z993 Dependence on wheelchair: Secondary | ICD-10-CM | POA: Diagnosis not present

## 2017-06-11 DIAGNOSIS — G609 Hereditary and idiopathic neuropathy, unspecified: Secondary | ICD-10-CM | POA: Diagnosis not present

## 2017-06-11 DIAGNOSIS — M62838 Other muscle spasm: Secondary | ICD-10-CM | POA: Diagnosis not present

## 2017-06-12 ENCOUNTER — Ambulatory Visit: Payer: Self-pay | Admitting: *Deleted

## 2017-06-13 ENCOUNTER — Other Ambulatory Visit: Payer: Self-pay | Admitting: *Deleted

## 2017-06-13 DIAGNOSIS — G35 Multiple sclerosis: Secondary | ICD-10-CM | POA: Diagnosis not present

## 2017-06-13 DIAGNOSIS — K5900 Constipation, unspecified: Secondary | ICD-10-CM | POA: Diagnosis not present

## 2017-06-13 DIAGNOSIS — G912 (Idiopathic) normal pressure hydrocephalus: Secondary | ICD-10-CM | POA: Diagnosis not present

## 2017-06-13 DIAGNOSIS — G9009 Other idiopathic peripheral autonomic neuropathy: Secondary | ICD-10-CM | POA: Diagnosis not present

## 2017-06-13 DIAGNOSIS — M6281 Muscle weakness (generalized): Secondary | ICD-10-CM | POA: Diagnosis not present

## 2017-06-13 DIAGNOSIS — R2689 Other abnormalities of gait and mobility: Secondary | ICD-10-CM | POA: Diagnosis not present

## 2017-06-13 NOTE — Patient Outreach (Signed)
06/13/17  Transition of care call:  Successful telephone encounter to [facility], 57 year old female for transition of care/ongoing follow up on  Recent SNF discharge 06/02/2017.  Pt's history includes but not limited to MS, wrist fracture, Hypercholesteremia.  Spoke with pt, HIPAA identifiers verified.  Pt reports HH PT/OT coming 3 days a week, no problems walking, strength  Getting better, using bedside commode.   Pt reports no pain, BP under control.  Pt reports saw Eye MD while at rehab.    Pt reports taking all of her medications, has not made appointment with PCP yet.  RN CM addressed doing a home visit  Again to which pt declined.    Plan:  As discussed with pt, plan to follow up again next week telephonically (part of ongoing transition of care).    Shayne Alken.   Jai Bear RN CCM [facility] Care Management  [phone]

## 2017-06-13 NOTE — Patient Outreach (Signed)
[facility]) Care Management  06/13/2017  Lisa Williams [birth year] [id number]   Phone call from patient today stating that the insurance company has denied coverage for [facility] services. Patient very frustrated and feels that the insurance company is not recognizing the progress she has made. Patient is not eligible for Medicaid, however she is not able to afford the out of pocket expense for PT services. Patient adamantly declines facility placement, stating that her mother and sister are available to assist. Patient states that she can take care of her own ADL's , is able to walk with her walker but is unable to stand for long periods of time. Per patient, she plans to appeal this decision. Patient declined referral for Homecare Providers. Patient verbalized having no additional community resource needs at this time but just wanted this Child psychotherapist to know about the denial and her plan to appeal. This Child psychotherapist provided patient with support and helped patient to brainstorm possible solutions.  Plan: This Child psychotherapist will discuss recommendations with RNCM     [name] [facility] Care Management [phone]

## 2017-06-14 DIAGNOSIS — G35 Multiple sclerosis: Secondary | ICD-10-CM | POA: Diagnosis not present

## 2017-06-14 DIAGNOSIS — I10 Essential (primary) hypertension: Secondary | ICD-10-CM | POA: Diagnosis not present

## 2017-06-14 DIAGNOSIS — Z993 Dependence on wheelchair: Secondary | ICD-10-CM | POA: Diagnosis not present

## 2017-06-14 DIAGNOSIS — G609 Hereditary and idiopathic neuropathy, unspecified: Secondary | ICD-10-CM | POA: Diagnosis not present

## 2017-06-14 DIAGNOSIS — Z9181 History of falling: Secondary | ICD-10-CM | POA: Diagnosis not present

## 2017-06-14 DIAGNOSIS — Z87891 Personal history of nicotine dependence: Secondary | ICD-10-CM | POA: Diagnosis not present

## 2017-06-14 DIAGNOSIS — S62102D Fracture of unspecified carpal bone, left wrist, subsequent encounter for fracture with routine healing: Secondary | ICD-10-CM | POA: Diagnosis not present

## 2017-06-14 DIAGNOSIS — M62838 Other muscle spasm: Secondary | ICD-10-CM | POA: Diagnosis not present

## 2017-06-14 DIAGNOSIS — M8580 Other specified disorders of bone density and structure, unspecified site: Secondary | ICD-10-CM | POA: Diagnosis not present

## 2017-06-15 ENCOUNTER — Other Ambulatory Visit: Payer: Self-pay | Admitting: *Deleted

## 2017-06-15 NOTE — Patient Outreach (Signed)
[facility]) Care Management  06/15/2017  Lisa Williams 03-May-1961 [id number]   Phone call to patient to explore transportation needs and the possibility of outpatient rehab. Per patient, she is able to use her walker and wheelchair to travel. Per patient, she generally uses [employer] for transportation to her medical appointments.   Plan: This social worker will follow up with the possibility of patient receiving outpatient rehab.           This Child psychotherapist to follow up within 1 week.    [name] [facility] Care Management [phone]

## 2017-06-18 ENCOUNTER — Other Ambulatory Visit: Payer: Self-pay | Admitting: *Deleted

## 2017-06-18 DIAGNOSIS — M62838 Other muscle spasm: Secondary | ICD-10-CM | POA: Diagnosis not present

## 2017-06-18 DIAGNOSIS — G35 Multiple sclerosis: Secondary | ICD-10-CM | POA: Diagnosis not present

## 2017-06-18 DIAGNOSIS — G609 Hereditary and idiopathic neuropathy, unspecified: Secondary | ICD-10-CM | POA: Diagnosis not present

## 2017-06-18 DIAGNOSIS — M8580 Other specified disorders of bone density and structure, unspecified site: Secondary | ICD-10-CM | POA: Diagnosis not present

## 2017-06-18 DIAGNOSIS — Z9181 History of falling: Secondary | ICD-10-CM | POA: Diagnosis not present

## 2017-06-18 DIAGNOSIS — I10 Essential (primary) hypertension: Secondary | ICD-10-CM | POA: Diagnosis not present

## 2017-06-18 DIAGNOSIS — Z87891 Personal history of nicotine dependence: Secondary | ICD-10-CM | POA: Diagnosis not present

## 2017-06-18 DIAGNOSIS — S62102D Fracture of unspecified carpal bone, left wrist, subsequent encounter for fracture with routine healing: Secondary | ICD-10-CM | POA: Diagnosis not present

## 2017-06-18 DIAGNOSIS — Z993 Dependence on wheelchair: Secondary | ICD-10-CM | POA: Diagnosis not present

## 2017-06-18 NOTE — Patient Outreach (Addendum)
[facility]) Care Management  06/18/2017  Bera Dwelle Boutwell [birth year] [id number]   Phone call to patient's Neurologist  office, Dr. [name] to request referral be sent over for outpatient PT at [facility].   This social worker to follow up with patient to ensure that referral has been received and the PT has been scheduled.    [name] [facility] Care Management [phone]

## 2017-06-18 NOTE — Patient Outreach (Addendum)
[facility]) Care Management  06/18/2017  Janin Sluder [place] 1960/10/15 [id number]   Phone call to patient to discuss referral for outpatient PT and transportation plans once referral is submitted and appointments are made. Per patient, her last [facility] PT and OT will be this week and they will work on transferring from her wheelchair to a vehicle for medical appointments. [employer] transportation discussed as well as support from family and friends. Once appointments are made, transportation options will be discussed further.   [name] [facility] Care Management [phone]

## 2017-06-19 ENCOUNTER — Ambulatory Visit: Payer: Self-pay | Admitting: *Deleted

## 2017-06-19 ENCOUNTER — Other Ambulatory Visit: Payer: Self-pay | Admitting: *Deleted

## 2017-06-19 NOTE — Patient Outreach (Addendum)
[facility]) Care Management  06/19/2017  Lisa Williams Bo 12-02-1960 [id number]   Phone call from patient on 06/18/17 stating that she had received a call from outpatient PT and had scheduled her initial appointment, however reports that she has won her appeal for in home [facility] with the insurance company and has plans to continue with [facility] for now. Patient will continue to use [employer] for her transportation needs. Per patient, she is able to take care of her ADL's and is not in need of Homecare Providers at this time. This Child psychotherapist will follow up within 1 week, if there are no additional needs, patient will be closed to [facility] social work.    [name] [facility] Care Management [phone]

## 2017-06-21 ENCOUNTER — Ambulatory Visit: Payer: PPO | Admitting: *Deleted

## 2017-06-25 ENCOUNTER — Encounter: Payer: Self-pay | Admitting: *Deleted

## 2017-06-25 ENCOUNTER — Other Ambulatory Visit: Payer: Self-pay | Admitting: *Deleted

## 2017-06-25 DIAGNOSIS — Z9181 History of falling: Secondary | ICD-10-CM | POA: Diagnosis not present

## 2017-06-25 DIAGNOSIS — S62102D Fracture of unspecified carpal bone, left wrist, subsequent encounter for fracture with routine healing: Secondary | ICD-10-CM | POA: Diagnosis not present

## 2017-06-25 DIAGNOSIS — G609 Hereditary and idiopathic neuropathy, unspecified: Secondary | ICD-10-CM | POA: Diagnosis not present

## 2017-06-25 DIAGNOSIS — G35 Multiple sclerosis: Secondary | ICD-10-CM | POA: Diagnosis not present

## 2017-06-25 DIAGNOSIS — M8580 Other specified disorders of bone density and structure, unspecified site: Secondary | ICD-10-CM | POA: Diagnosis not present

## 2017-06-25 DIAGNOSIS — I10 Essential (primary) hypertension: Secondary | ICD-10-CM | POA: Diagnosis not present

## 2017-06-25 DIAGNOSIS — M62838 Other muscle spasm: Secondary | ICD-10-CM | POA: Diagnosis not present

## 2017-06-25 DIAGNOSIS — Z993 Dependence on wheelchair: Secondary | ICD-10-CM | POA: Diagnosis not present

## 2017-06-25 DIAGNOSIS — Z87891 Personal history of nicotine dependence: Secondary | ICD-10-CM | POA: Diagnosis not present

## 2017-06-25 NOTE — Patient Outreach (Signed)
Lisa [facility]) Care Management  06/25/2017  [place] [birth year] [id number]   Phone call to patient to assess for continued social work involvement. Per patient she continues to make progress with [facility]. Patient states that she is walking with her walker from the the bedroom to the kitchen and to the front door. Her balance is still unstable but is improving. Patient is expecting to go to outpatient rehab following Lisa Williams. Patient remains motivated to work to improve her strength and mobility.  Patient verbalized having all of her basic needs met at this time. Patient to discharge to social work at this time.  This social  provided patient with my contact information for future community resource needs or concerns.    Lisa Williams [facility] Care Management [phone]

## 2017-06-27 ENCOUNTER — Other Ambulatory Visit: Payer: Self-pay | Admitting: *Deleted

## 2017-06-27 NOTE — Patient Outreach (Signed)
06/17/2017   Successful telephone encounter to [facility], 57 year old female for transition of care/ongoing follow up on recent SNF discharge 06/02/17.  Most recent hospitalization October 29-November 7,2018 for MS exacerbation.  Pt's history includes but not limited to Hypercholesteremia, adiposity, Renal colic, Laparoscopic Supracervical hysterectomy.    Spoke with pt, HIPAA identifiers verified.  Pt reports everything is going good- Walking with her walker, strength and balance improving, strength better.   Pt reports still getting HH PT and aide,  Doing PT 6 weeks at a time, plan is to go to Outpatient rehab after [facility] PT is done.  Pt reports taking all of her  Medications.    Plan:  As discussed with pt, plan to follow up again next week for final transition of care call.    Shayne Alken.   Pierzchala RN CCM [facility] Care Management  [phone]

## 2017-06-28 DIAGNOSIS — Z993 Dependence on wheelchair: Secondary | ICD-10-CM | POA: Diagnosis not present

## 2017-06-28 DIAGNOSIS — Z87891 Personal history of nicotine dependence: Secondary | ICD-10-CM | POA: Diagnosis not present

## 2017-06-28 DIAGNOSIS — G35 Multiple sclerosis: Secondary | ICD-10-CM | POA: Diagnosis not present

## 2017-06-28 DIAGNOSIS — Z9181 History of falling: Secondary | ICD-10-CM | POA: Diagnosis not present

## 2017-06-28 DIAGNOSIS — M62838 Other muscle spasm: Secondary | ICD-10-CM | POA: Diagnosis not present

## 2017-06-28 DIAGNOSIS — I10 Essential (primary) hypertension: Secondary | ICD-10-CM | POA: Diagnosis not present

## 2017-06-28 DIAGNOSIS — M8580 Other specified disorders of bone density and structure, unspecified site: Secondary | ICD-10-CM | POA: Diagnosis not present

## 2017-06-28 DIAGNOSIS — G609 Hereditary and idiopathic neuropathy, unspecified: Secondary | ICD-10-CM | POA: Diagnosis not present

## 2017-06-28 DIAGNOSIS — S62102D Fracture of unspecified carpal bone, left wrist, subsequent encounter for fracture with routine healing: Secondary | ICD-10-CM | POA: Diagnosis not present

## 2017-07-02 DIAGNOSIS — M62838 Other muscle spasm: Secondary | ICD-10-CM | POA: Diagnosis not present

## 2017-07-02 DIAGNOSIS — M8580 Other specified disorders of bone density and structure, unspecified site: Secondary | ICD-10-CM | POA: Diagnosis not present

## 2017-07-02 DIAGNOSIS — I10 Essential (primary) hypertension: Secondary | ICD-10-CM | POA: Diagnosis not present

## 2017-07-02 DIAGNOSIS — S62102D Fracture of unspecified carpal bone, left wrist, subsequent encounter for fracture with routine healing: Secondary | ICD-10-CM | POA: Diagnosis not present

## 2017-07-02 DIAGNOSIS — G609 Hereditary and idiopathic neuropathy, unspecified: Secondary | ICD-10-CM | POA: Diagnosis not present

## 2017-07-02 DIAGNOSIS — G35 Multiple sclerosis: Secondary | ICD-10-CM | POA: Diagnosis not present

## 2017-07-02 DIAGNOSIS — Z9181 History of falling: Secondary | ICD-10-CM | POA: Diagnosis not present

## 2017-07-02 DIAGNOSIS — Z993 Dependence on wheelchair: Secondary | ICD-10-CM | POA: Diagnosis not present

## 2017-07-02 DIAGNOSIS — Z87891 Personal history of nicotine dependence: Secondary | ICD-10-CM | POA: Diagnosis not present

## 2017-07-04 ENCOUNTER — Ambulatory Visit: Payer: PPO | Admitting: Physical Therapy

## 2017-07-05 ENCOUNTER — Other Ambulatory Visit: Payer: Self-pay | Admitting: *Deleted

## 2017-07-05 NOTE — Patient Outreach (Signed)
07/05/2017  Final Transition of care call:   Successful telephone encounter to [facility], 57 year old female for transition of care (final Call), ongoing follow up on SNF discharge on 06/02/16, admitted to rehab from home on 04/23/17.  Most  Recent  hospitalization October 29-March 14, 2017 for MS  exacerbation.  Spoke with pt, HIPAA identifiers verified.  Pt reports doing wonderful, still working with [facility] PT and OT, walked 100 feet last week, next week OT to  Work on walking to the bathroom verses using the bedside commode.  Pt reports HH PT waiting on approval For 2 more weeks of therapy,after that  plan to go to Outpatient therapy.  Pt reports she went for the first time this  Week (sister driving) to get eyeglasses, used walker to get in and out of car/wheechair to get around- no  Problems.   Pt reports strength, mobility getting better, still working on balance- getting out of bed/ dressed On her own,  [facility] aide still coming.   Pt reports taking all of her medications.  RN CM discussed with pt today Final transition of care call, to follow up again next month telephonically- check on status.   Plan:  As discussed with pt, plan to follow up again next month telephonically- check on clinical status.   Shayne Alken.   Rozlyn Yerby RN CCM [facility] Care Management  [phone]

## 2017-07-09 ENCOUNTER — Ambulatory Visit: Payer: PPO | Admitting: Physical Therapy

## 2017-07-11 ENCOUNTER — Ambulatory Visit: Payer: PPO | Admitting: Physical Therapy

## 2017-07-11 DIAGNOSIS — G912 (Idiopathic) normal pressure hydrocephalus: Secondary | ICD-10-CM | POA: Diagnosis not present

## 2017-07-11 DIAGNOSIS — G9009 Other idiopathic peripheral autonomic neuropathy: Secondary | ICD-10-CM | POA: Diagnosis not present

## 2017-07-11 DIAGNOSIS — M6281 Muscle weakness (generalized): Secondary | ICD-10-CM | POA: Diagnosis not present

## 2017-07-11 DIAGNOSIS — R2689 Other abnormalities of gait and mobility: Secondary | ICD-10-CM | POA: Diagnosis not present

## 2017-07-11 DIAGNOSIS — G35 Multiple sclerosis: Secondary | ICD-10-CM | POA: Diagnosis not present

## 2017-07-11 DIAGNOSIS — K5900 Constipation, unspecified: Secondary | ICD-10-CM | POA: Diagnosis not present

## 2017-07-12 DIAGNOSIS — G35 Multiple sclerosis: Secondary | ICD-10-CM | POA: Diagnosis not present

## 2017-07-16 ENCOUNTER — Ambulatory Visit: Payer: PPO | Admitting: Physical Therapy

## 2017-07-18 ENCOUNTER — Ambulatory Visit: Payer: PPO | Admitting: Physical Therapy

## 2017-07-23 ENCOUNTER — Ambulatory Visit: Payer: PPO | Admitting: Physical Therapy

## 2017-07-26 DIAGNOSIS — Z9181 History of falling: Secondary | ICD-10-CM | POA: Diagnosis not present

## 2017-07-26 DIAGNOSIS — Z993 Dependence on wheelchair: Secondary | ICD-10-CM | POA: Diagnosis not present

## 2017-07-26 DIAGNOSIS — M62838 Other muscle spasm: Secondary | ICD-10-CM | POA: Diagnosis not present

## 2017-07-26 DIAGNOSIS — S62102D Fracture of unspecified carpal bone, left wrist, subsequent encounter for fracture with routine healing: Secondary | ICD-10-CM | POA: Diagnosis not present

## 2017-07-26 DIAGNOSIS — G609 Hereditary and idiopathic neuropathy, unspecified: Secondary | ICD-10-CM | POA: Diagnosis not present

## 2017-07-26 DIAGNOSIS — I10 Essential (primary) hypertension: Secondary | ICD-10-CM | POA: Diagnosis not present

## 2017-07-26 DIAGNOSIS — M8580 Other specified disorders of bone density and structure, unspecified site: Secondary | ICD-10-CM | POA: Diagnosis not present

## 2017-07-26 DIAGNOSIS — G35 Multiple sclerosis: Secondary | ICD-10-CM | POA: Diagnosis not present

## 2017-07-26 DIAGNOSIS — Z87891 Personal history of nicotine dependence: Secondary | ICD-10-CM | POA: Diagnosis not present

## 2017-07-27 ENCOUNTER — Encounter: Payer: Self-pay | Admitting: Physical Therapy

## 2017-07-27 ENCOUNTER — Ambulatory Visit: Payer: PPO | Attending: Neurology | Admitting: Physical Therapy

## 2017-07-27 ENCOUNTER — Other Ambulatory Visit: Payer: Self-pay

## 2017-07-27 DIAGNOSIS — R2689 Other abnormalities of gait and mobility: Secondary | ICD-10-CM | POA: Diagnosis not present

## 2017-07-27 DIAGNOSIS — M6281 Muscle weakness (generalized): Secondary | ICD-10-CM | POA: Insufficient documentation

## 2017-07-27 DIAGNOSIS — R2681 Unsteadiness on feet: Secondary | ICD-10-CM | POA: Diagnosis not present

## 2017-07-27 DIAGNOSIS — G35 Multiple sclerosis: Secondary | ICD-10-CM | POA: Diagnosis not present

## 2017-07-27 NOTE — Therapy (Signed)
[facility] MAIN [facility] SERVICES [address] [place], [place], [address] Phone: [phone]   Fax:  [phone]  Physical Therapy Evaluation  Patient Details  Name: Lisa Williams MRN: [id number] Date of Birth: [birth year] Referring Provider: Theora Master, MD   Encounter Date: 07/27/2017  PT End of Session - 07/27/17 1152    Visit Number  1    Number of Visits  17    Date for PT Re-Evaluation  09/21/17    PT Start Time  1014    PT Stop Time  1116    PT Time Calculation (min)  62 min    Equipment Utilized During Treatment  Gait belt    Activity Tolerance  Patient tolerated treatment well;Patient limited by fatigue    Behavior During Therapy  [facility] for tasks assessed/performed       Past Medical History:  Diagnosis Date  . Abdominal pain, right upper quadrant   . Back pain   . Calculus of kidney 12/09/2013  . Chronic back pain    unspecified  . Chronic left shoulder pain 07/19/2015  . Functional disorder of bladder    other  . Galactorrhea 11/26/2014   Chronic   . Hereditary and idiopathic neuropathy 08/19/2013  . HPV test positive   . Hypercholesteremia 08/19/2013  . Incomplete bladder emptying   . Microscopic hematuria   . MS (multiple sclerosis) (HCC)   . Muscle spasticity 05/21/2014  . Nonspecific findings on examination of urine    other  . Osteopenia   . Status post laparoscopic supracervical hysterectomy 11/26/2014  . Tobacco user 11/26/2014  . Wrist fracture     Past Surgical History:  Procedure Laterality Date  . bilateral tubal ligation  1996  . BREAST CYST EXCISION Left 2002  . KNEE SURGERY     right  . LAPAROSCOPIC SUPRACERVICAL HYSTERECTOMY  08/05/2013  . ORIF WRIST FRACTURE Left 01/17/2017   Procedure: OPEN REDUCTION INTERNAL FIXATION (ORIF) WRIST FRACTURE;  Surgeon: Lyndle Herrlich, MD;  Location: [facility] ORS;  Service: Orthopedics;  Laterality: Left;  . TUBAL LIGATION Bilateral   . VAGINAL HYSTERECTOMY   03/2006    There were no vitals filed for this visit.   Subjective Assessment - 07/27/17 1032    Subjective  Pt presents with weakness due to h/o MS    Patient is accompained by:  Family member mother and sister    Pertinent History  Pt is a 57 y/o F who presents with BLE weakness and difficulty ambulating due to h/o MS that was diagnosed in 1995. Pt denies any pain associated. Pt was admitted to the hospital in November 2018 due to MS exacerbation. Nov-Dec to [facility]-->[facility] until end of Jan. After rehab the pt was able to ambulate up to 100 ft with the RW. Pt has Bil AFO, her therapist at [facility] thinks she needs a new AFO as her feet still drag when she ambulates, pt reports HHPT mentioned a spring AFO. Pt has custom AFOs that were originally made 4 years ago. Goals: improve balance, to be able to get into the shower with her tub bench without assist, to be able to get up 2 steps at sister's home with railings on both sides but too far apart to reach both sides, to be able to drive adapted car. Pt is staying at other sister's house with one step to get inside and currently picking up L leg with UEs and then stepping in. Pt steps down leading with  LLE. Pt wants to work on walking side to side as this is challenging and she had not yet progressed to this at rehab. Pt able to get in/out the car with assist only to bring BLEs into the car. Hoping to drive by June or July with adapted car (already has one). Pt needs assist bringing LEs into and out of hospital bed. Pt able to perform sit>stand without assist from [facility] with a pillow on the seat for a boost. Pt denies any falls in the past 6 months. Pt has been increasing her time alone at home up to a few hours at a time to become more independent. Pt has both manual and power WC. Uses power WC at home, uses manual WC out in community. Pt able to self propel manual WC. Pt needs assist putting pants around ankles but she can pull them up. Pt can put on  her shoes but needs help getting her leg up to do so. Pt able to tie her shoelaces on her own. Pt has a tub/shower unit at home with a tub bench. No grab bars in the shower. Has hand held shower head. Pt currently taking a sponge bath. Pt has a regular height toilet with a BSC over top. Pt with h/o L wrist fx with no precautions, pt wore her soft brace to evaluation for comfort but reports her wrist has healed and is doing well following OT.     Limitations  Lifting;Standing;Walking;House hold activities    How long can you sit comfortably?  n/a    How long can you stand comfortably?  3 minutes without UE assist, limited by fatigue    How long can you walk comfortably?  100 ft, limited by fatigue    Patient Stated Goals  see above    Currently in Pain?  No/denies    Multiple Pain Sites  No         [facility] PT Assessment - 07/27/17 1034      Assessment   Medical Diagnosis   "MS-G35" which is the code for MS    Referring Provider  Theora Master, MD    Onset Date/Surgical Date  05/29/93    Hand Dominance  Right    Next MD Visit  She hasn't made one yet    Prior Therapy  Yes      Precautions   Precautions  Fall      Restrictions   Weight Bearing Restrictions  No      Balance Screen   Has the patient fallen in the past 6 months  No    Has the patient had a decrease in activity level because of a fear of falling?   No    Is the patient reluctant to leave their home because of a fear of falling?   No      Home Environment   Living Environment  Private residence    Living Arrangements  Other relatives living at sister's house currently    Available Help at Discharge  Family;Available PRN/intermittently    Type of Home  House    Home Access  Stairs to enter    Entrance Stairs-Number of Steps  1    Entrance Stairs-Rails  None    Home Layout  One level    Home Equipment  Walker - 2 wheels;Wheelchair - Engineer, technical sales - power;Hospital bed;Tub bench;Bedside commode;Hand held shower head       Prior Function   Level of Independence  Needs assistance with ADLs;Needs  assistance with homemaking;Needs assistance with gait    Leisure  shopping (although hasn't been able to do this for the past 6 months), traveling      Cognition   Overall Cognitive Status  Within Functional Limits for tasks assessed      ROM / Strength   AROM / PROM / Strength  Strength      Strength   Overall Strength  Deficits    Strength Assessment Site  Hip;Knee;Ankle    Right/Left Hip  Right;Left    Right Hip Flexion  1/5    Right Hip External Rotation   2+/5    Right Hip Internal Rotation  2/5    Right Hip ABduction  2/5    Right Hip ADduction  2/5    Left Hip Flexion  2/5    Left Hip External Rotation  2/5    Left Hip Internal Rotation  2/5    Left Hip ABduction  2+/5    Left Hip ADduction  2+/5    Right/Left Knee  Right;Left    Right Knee Flexion  2+/5    Right Knee Extension  3-/5    Left Knee Flexion  3-/5    Left Knee Extension  3/5    Right/Left Ankle  Right;Left    Right Ankle Dorsiflexion  1/5    Left Ankle Dorsiflexion  2-/5       EXAMINATION   5xSTS: 3.02 minutes   : 0.14 m/s   ABC Scale: 28.13%   LEFS: 34/80   Sensation: WNL BLEs   Reflexes: +2 Bil patellar tendons, unable to test achilles due to AFOs   Coordination: Impaired grossly but likely affected by weakness   Posture: dec lumbar lordosis with posterior rotated pelvis and mild thoracic kyphosis   Gait Analysis: Pt with sock on R forefoot to allow R foot to slide on floor. Pt first pushes RW ahead then pushes very heavilty through BUEs on RW and demonstrates Bil hip hike to achieve advancement of each LE. Pt with Bil AFO's; however, pt drags Bil feet on the floor with no foot clearance. No knee F or hip F either LE.   Sit<>stand analysis: Pt does not scoot forward in seat. She is unable to stand from regular height WC or mat table without min assist. She normally has a pillow in her WC to give her a boost.  Pt places R hand on RW and pushes up with L hand from seat and then uses her momentum to achieve upright, transferring L hand up to RW. To sit, the pt reaches back with L UE and demonstrates very little eccentric control during descent.   Bed mobility analysis: Pt moves to sidelying/almost supine independently but requires full assist to bring BLEs into bed. Same to return to sitting from supine. Pt able to push up to sitting once LEs are advance to EOB.      TREATMENT  Bed mobility: Provided pt with belt with loop at distal end to place around L foot for sit>supine and on R foot for supine>sit. Pt still require min assist to bring LEs to EOB.          Objective measurements completed on examination: See above findings.              PT Education - 07/27/17 1151    Education provided  Yes    Education Details  Role of PT; POC; examination findings    Person(s) Educated  Patient;Parent(s);Other (comment) sister  Methods  Explanation;Demonstration;Verbal cues    Comprehension  Verbalized understanding;Returned demonstration;Verbal cues required;Need further instruction       PT Short Term Goals - 07/27/17 1153      PT SHORT TERM GOAL #1   Title  Pt will be completed HEP with min assist from caregiver at least 4 days/wk for improved carryover between sessions    Time  2    Period  Weeks    Status  New        PT Long Term Goals - 07/27/17 1153      PT LONG TERM GOAL #1   Title  Pt's 5xSTS will impove to equal to or below 2 minutes to demonstrate improved balance and BLE strength    Baseline  3.02 minutes    Time  6    Period  Weeks    Status  New      PT LONG TERM GOAL #2   Title  Pt's will improve to at least 0.5 m/s for improved safety ambulating in home    Baseline  0.14 m/s    Time  8    Period  Weeks    Status  New      PT LONG TERM GOAL #3   Title  Pt's Bil hip F strength will improve to at least 2+/5 BLEs for improved gait mechanics and  safety    Baseline  L: 2/5, R: 1/5    Time  8    Period  Weeks    Status  New      PT LONG TERM GOAL #4   Title  Pt's ABC Scale will improve to at least 40% to demonstrate improved balance    Baseline  28.13%      PT LONG TERM GOAL #5   Title  Pt will be able to perform car transfer without assist to work towards pt's goal of returning to driving    Baseline  Requires assist to bring BLEs into/out of car    Time  6    Period  Weeks    Status  New      Additional Long Term Goals   Additional Long Term Goals  Yes      PT LONG TERM GOAL #6   Title  Pt will be able to perform tub bench into/out of shower transfer without assist for improved independence with this task    Baseline  Taking sponge bath as she requires assist for transfer    Time  6    Period  Weeks    Status  New             Plan - 07/27/17 1202    Clinical Impression Statement  Pt is a 57 y/o F who presents with BLE weakness, difficulty walking, and instability due to h/o MS which was first diagnosed in 1995 but with recent exacerbation.  Pt presents with significant BLE weakness, especially RLE.  This weakness impacts all aspect of her mobility and is greatly reflected in her gait.  She is unable to actively DF either LE and thus performs hip hiking and uses strength in Bil triceps to advance BLEs when ambulating.  She is able to stand from an elevated surface without assist but with much effort and use of momentum.  Her 5xSTS time indicates pt has decreased strength and balance.  Her ABC scale indicates her poor balance negatively affects her activity level.  Pt requires assist for bed mobility, shower transfers,  and car transfer and would like to become independent with these.  Pt will benefit from skilled PT interventions for improved strength, balance, and gait mechanics.     History and Personal Factors relevant to plan of care:  (+) pt extremely motivated, very supportive family  (-) Dx of MS    Clinical  Presentation  Evolving    Clinical Presentation due to:  Pt's condition is progressive in nature and is unpredictable with occasional unpredictable exacerbations    Clinical Decision Making  Moderate    Rehab Potential  Good    PT Frequency  2x / week    PT Duration  8 weeks    PT Treatment/Interventions  ADLs/Self Care Home Management;Aquatic Therapy;Biofeedback;Cryotherapy;Electrical Stimulation;Iontophoresis 4mg /ml Dexamethasone;Traction;Moist Heat;Ultrasound;DME Instruction;Gait training;Stair training;Functional mobility training;Therapeutic activities;Therapeutic exercise;Balance training;Neuromuscular re-education;Patient/family education;Orthotic Fit/Training;Wheelchair mobility training;Manual techniques;Compression bandaging;Passive range of motion;Dry needling;Energy conservation;Taping;Splinting    PT Next Visit Plan  assess need for new AFOs or other orthotic to assist with ambulation, initiate strengthening program, practice and improve technique for bed mobility and sit<>stand    PT Home Exercise Plan  to initiate next session    Recommended Other Services  none at this time    Consulted and Agree with Plan of Care  Patient       Patient will benefit from skilled therapeutic intervention in order to improve the following deficits and impairments:  Abnormal gait, Decreased activity tolerance, Decreased balance, Decreased coordination, Decreased endurance, Decreased knowledge of use of DME, Decreased mobility, Decreased range of motion, Decreased safety awareness, Decreased strength, Difficulty walking, Impaired perceived functional ability, Impaired sensation, Impaired tone, Impaired UE functional use, Improper body mechanics, Postural dysfunction  Visit Diagnosis: Muscle weakness (generalized)  Other abnormalities of gait and mobility  Unsteadiness on feet     Problem List Patient Active Problem List   Diagnosis Date Noted  . Wrist fracture 01/16/2017  . Health care  maintenance 01/24/2016  . Blood pressure elevated without history of HTN 10/25/2015  . Multiple sclerosis (HCC) 10/02/2015  . Chronic left shoulder pain 07/19/2015  . Multiple sclerosis exacerbation (HCC) 07/14/2015  . MS (multiple sclerosis) (HCC) 11/26/2014  . Increased body mass index 11/26/2014  . HPV test positive 11/26/2014  . Status post laparoscopic supracervical hysterectomy 11/26/2014  . Galactorrhea 11/26/2014  . Tobacco user 11/26/2014  . Back ache 05/21/2014  . Adiposity 05/21/2014  . Disordered sleep 05/21/2014  . Muscle spasticity 05/21/2014  . Calculus of kidney 12/09/2013  . Renal colic 12/09/2013  . Hypercholesteremia 08/19/2013  . Hereditary and idiopathic neuropathy 08/19/2013  . Hypercholesterolemia without hypertriglyceridemia 08/19/2013  . Bladder infection, chronic 07/25/2012  . Disorder of bladder function 07/25/2012  . Incomplete bladder emptying 07/25/2012  . Microscopic hematuria 07/25/2012    Encarnacion Chu PT, DPT 07/27/2017, 12:11 PM  [facility] [facility] MAIN [facility] SERVICES [address] [place], [place], [address] Phone: [phone]   Fax:  [phone]  Name: Lisa Williams MRN: [id number] Date of Birth: [birth year]

## 2017-07-31 ENCOUNTER — Ambulatory Visit: Payer: PPO

## 2017-07-31 VITALS — BP 163/88 | HR 65

## 2017-07-31 DIAGNOSIS — R2681 Unsteadiness on feet: Secondary | ICD-10-CM

## 2017-07-31 DIAGNOSIS — R2689 Other abnormalities of gait and mobility: Secondary | ICD-10-CM

## 2017-07-31 DIAGNOSIS — M6281 Muscle weakness (generalized): Secondary | ICD-10-CM | POA: Diagnosis not present

## 2017-07-31 NOTE — Therapy (Signed)
[facility] MAIN [facility] SERVICES [address] [place], [place], [address] Phone: [phone]   Fax:  [phone]  Physical Therapy Treatment  Patient Details  Name: Lisa Williams MRN: [id number] Date of Birth: 03/07/1961 Referring Provider: Theora Master, MD   Encounter Date: 07/31/2017  PT End of Session - 07/31/17 1245    Visit Number  2    Number of Visits  17    Date for PT Re-Evaluation  09/21/17    PT Start Time  1118    PT Stop Time  1200    PT Time Calculation (min)  42 min    Equipment Utilized During Treatment  Gait belt    Activity Tolerance  Patient tolerated treatment well;Patient limited by fatigue    Behavior During Therapy  [facility] for tasks assessed/performed       Past Medical History:  Diagnosis Date  . Abdominal pain, right upper quadrant   . Back pain   . Calculus of kidney 12/09/2013  . Chronic back pain    unspecified  . Chronic left shoulder pain 07/19/2015  . Functional disorder of bladder    other  . Galactorrhea 11/26/2014   Chronic   . Hereditary and idiopathic neuropathy 08/19/2013  . HPV test positive   . Hypercholesteremia 08/19/2013  . Incomplete bladder emptying   . Microscopic hematuria   . MS (multiple sclerosis) (HCC)   . Muscle spasticity 05/21/2014  . Nonspecific findings on examination of urine    other  . Osteopenia   . Status post laparoscopic supracervical hysterectomy 11/26/2014  . Tobacco user 11/26/2014  . Wrist fracture     Past Surgical History:  Procedure Laterality Date  . bilateral tubal ligation  1996  . BREAST CYST EXCISION Left 2002  . KNEE SURGERY     right  . LAPAROSCOPIC SUPRACERVICAL HYSTERECTOMY  08/05/2013  . ORIF WRIST FRACTURE Left 01/17/2017   Procedure: OPEN REDUCTION INTERNAL FIXATION (ORIF) WRIST FRACTURE;  Surgeon: Lyndle Herrlich, MD;  Location: [facility] ORS;  Service: Orthopedics;  Laterality: Left;  . TUBAL LIGATION Bilateral   . VAGINAL HYSTERECTOMY   03/2006    Vitals:   07/31/17 1122  BP: (!) 163/88  Pulse: 65  SpO2: 100%    Subjective Assessment - 07/31/17 1243    Subjective  Pt reports thta she is doing well on this date. She denies pain today and is excited about working with outpatient physical therapy. No specific questions or concerns    Patient is accompained by:  Family member mother and sister    Pertinent History  Pt is a 57 y/o F who presents with BLE weakness and difficulty ambulating due to h/o MS that was diagnosed in 1995. Pt denies any pain associated. Pt was admitted to the hospital in November 2018 due to MS exacerbation. Nov-Dec to HHPT-->[facility] until end of Jan. After rehab the pt was able to ambulate up to 100 ft with the RW. Pt has Bil AFO, her therapist at [facility]PT thinks she needs a new AFO as her feet still drag when she ambulates, pt reports HHPT mentioned a spring AFO. Pt has custom AFOs that were originally made 4 years ago. Goals: improve balance, to be able to get into the shower with her tub bench without assist, to be able to get up 2 steps at sister's home with railings on both sides but too far apart to reach both sides, to be able to drive adapted car. Pt is  staying at other sister's house with one step to get inside and currently picking up L leg with UEs and then stepping in. Pt steps down leading with LLE. Pt wants to work on walking side to side as this is challenging and she had not yet progressed to this at rehab. Pt able to get in/out the car with assist only to bring BLEs into the car. Hoping to drive by June or July with adapted car (already has one). Pt needs assist bringing LEs into and out of hospital bed. Pt able to perform sit>stand without assist from [facility] with a pillow on the seat for a boost. Pt denies any falls in the past 6 months. Pt has been increasing her time alone at home up to a few hours at a time to become more independent. Pt has both manual and power WC. Uses power WC at home,  uses manual WC out in community. Pt able to self propel manual WC. Pt needs assist putting pants around ankles but she can pull them up. Pt can put on her shoes but needs help getting her leg up to do so. Pt able to tie her shoelaces on her own. Pt has a tub/shower unit at home with a tub bench. No grab bars in the shower. Has hand held shower head. Pt currently taking a sponge bath. Pt has a regular height toilet with a BSC over top. Pt with h/o L wrist fx with no precautions, pt wore her soft brace to evaluation for comfort but reports her wrist has healed and is doing well following OT.     Limitations  Lifting;Standing;Walking;House hold activities    How long can you sit comfortably?  n/a    How long can you stand comfortably?  3 minutes without UE assist, limited by fatigue    How long can you walk comfortably?  100 ft, limited by fatigue    Patient Stated Goals  see above    Currently in Pain?  No/denies                No data recorded      TREATMENT   Ther-ex  NuStep L4 x 5 minutes for warm-up during history, cues/assist to/from machine; Ambulation in // bars 10' x multiple lengths throughout session; Standing hip abduction x 10 bilateral; Sit to stand from elevated seat with UE assist on // bars 2 x 5; Standing mini squats 2 x 10; Seated LAQ with eccentric control for lowering to floor x 10 bilateral; Pt wheeled to parking lot and assisted with car transfers. Cues provided for proper sequencing;       PT Education - 07/31/17 1244    Education provided  Yes    Education Details  exercise form/technique, ambulation in parallel bars    Person(s) Educated  Patient    Methods  Explanation    Comprehension  Verbalized understanding       PT Short Term Goals - 07/27/17 1153      PT SHORT TERM GOAL #1   Title  Pt will be completed HEP with min assist from caregiver at least 4 days/wk for improved carryover between sessions    Time  2    Period  Weeks     Status  New        PT Long Term Goals - 07/27/17 1153      PT LONG TERM GOAL #1   Title  Pt's 5xSTS will impove to equal to or below 2  minutes to demonstrate improved balance and BLE strength    Baseline  3.02 minutes    Time  6    Period  Weeks    Status  New      PT LONG TERM GOAL #2   Title  Pt's will improve to at least 0.5 m/s for improved safety ambulating in home    Baseline  0.14 m/s    Time  8    Period  Weeks    Status  New      PT LONG TERM GOAL #3   Title  Pt's Bil hip F strength will improve to at least 2+/5 BLEs for improved gait mechanics and safety    Baseline  L: 2/5, R: 1/5    Time  8    Period  Weeks    Status  New      PT LONG TERM GOAL #4   Title  Pt's ABC Scale will improve to at least 40% to demonstrate improved balance    Baseline  28.13%      PT LONG TERM GOAL #5   Title  Pt will be able to perform car transfer without assist to work towards pt's goal of returning to driving    Baseline  Requires assist to bring BLEs into/out of car    Time  6    Period  Weeks    Status  New      Additional Long Term Goals   Additional Long Term Goals  Yes      PT LONG TERM GOAL #6   Title  Pt will be able to perform tub bench into/out of shower transfer without assist for improved independence with this task    Baseline  Taking sponge bath as she requires assist for transfer    Time  6    Period  Weeks    Status  New            Plan - 07/31/17 1245    Clinical Impression Statement  Pt demonstrates excellent motivation with therapy on this date. She is able to walk extensively in parallel bars with therapist today. She is unable to flex hips so uses heavy UE support, hip hiking, and momentum to advance bilateral LEs. She is able to perform standing hip abduction and mini squats. Pt encouraged to follow-up as scheduled.     Rehab Potential  Good    PT Frequency  2x / week    PT Duration  8 weeks    PT Treatment/Interventions  ADLs/Self Care  Home Management;Aquatic Therapy;Biofeedback;Cryotherapy;Electrical Stimulation;Iontophoresis 4mg /ml Dexamethasone;Traction;Moist Heat;Ultrasound;DME Instruction;Gait training;Stair training;Functional mobility training;Therapeutic activities;Therapeutic exercise;Balance training;Neuromuscular re-education;Patient/family education;Orthotic Fit/Training;Wheelchair mobility training;Manual techniques;Compression bandaging;Passive range of motion;Dry needling;Energy conservation;Taping;Splinting    PT Next Visit Plan  assess need for new AFOs or other orthotic to assist with ambulation, initiate strengthening program, practice and improve technique for bed mobility and sit<>stand    PT Home Exercise Plan  to initiate next session    Consulted and Agree with Plan of Care  Patient       Patient will benefit from skilled therapeutic intervention in order to improve the following deficits and impairments:  Abnormal gait, Decreased activity tolerance, Decreased balance, Decreased coordination, Decreased endurance, Decreased knowledge of use of DME, Decreased mobility, Decreased range of motion, Decreased safety awareness, Decreased strength, Difficulty walking, Impaired perceived functional ability, Impaired sensation, Impaired tone, Impaired UE functional use, Improper body mechanics, Postural dysfunction  Visit Diagnosis: Muscle weakness (generalized)  Other  abnormalities of gait and mobility  Unsteadiness on feet     Problem List Patient Active Problem List   Diagnosis Date Noted  . Wrist fracture 01/16/2017  . Health care maintenance 01/24/2016  . Blood pressure elevated without history of HTN 10/25/2015  . Multiple sclerosis (HCC) 10/02/2015  . Chronic left shoulder pain 07/19/2015  . Multiple sclerosis exacerbation (HCC) 07/14/2015  . MS (multiple sclerosis) (HCC) 11/26/2014  . Increased body mass index 11/26/2014  . HPV test positive 11/26/2014  . Status post laparoscopic supracervical  hysterectomy 11/26/2014  . Galactorrhea 11/26/2014  . Tobacco user 11/26/2014  . Back ache 05/21/2014  . Adiposity 05/21/2014  . Disordered sleep 05/21/2014  . Muscle spasticity 05/21/2014  . Calculus of kidney 12/09/2013  . Renal colic 12/09/2013  . Hypercholesteremia 08/19/2013  . Hereditary and idiopathic neuropathy 08/19/2013  . Hypercholesterolemia without hypertriglyceridemia 08/19/2013  . Bladder infection, chronic 07/25/2012  . Disorder of bladder function 07/25/2012  . Incomplete bladder emptying 07/25/2012  . Microscopic hematuria 07/25/2012   Lynnea Maizes PT, DPT   Ishan Sanroman 07/31/2017, 12:51 PM  [facility] MAIN [facility] SERVICES [address] [place], [place], [address] Phone: [phone]   Fax:  [phone]  Name: Lisa Williams MRN: [id number] Date of Birth: 1961-03-14

## 2017-08-03 ENCOUNTER — Ambulatory Visit: Payer: PPO

## 2017-08-03 VITALS — BP 134/72 | HR 70

## 2017-08-03 DIAGNOSIS — R2681 Unsteadiness on feet: Secondary | ICD-10-CM

## 2017-08-03 DIAGNOSIS — M6281 Muscle weakness (generalized): Secondary | ICD-10-CM

## 2017-08-03 NOTE — Therapy (Signed)
[facility] MAIN [facility] SERVICES [address] [place], [place], [address] Phone: [phone]   Fax:  [phone]  Physical Therapy Treatment  Patient Details  Name: Tinley Rought Gilpatrick MRN: [id number] Date of Birth: 11/17/1960 Referring Provider: Theora Master, MD   Encounter Date: 08/03/2017  PT End of Session - 08/03/17 1056    Visit Number  3    Number of Visits  17    Date for PT Re-Evaluation  09/21/17    PT Start Time  1048    PT Stop Time  1140    PT Time Calculation (min)  52 min    Equipment Utilized During Treatment  Gait belt    Activity Tolerance  Patient tolerated treatment well;Patient limited by fatigue    Behavior During Therapy  [facility] for tasks assessed/performed       Past Medical History:  Diagnosis Date  . Abdominal pain, right upper quadrant   . Back pain   . Calculus of kidney 12/09/2013  . Chronic back pain    unspecified  . Chronic left shoulder pain 07/19/2015  . Functional disorder of bladder    other  . Galactorrhea 11/26/2014   Chronic   . Hereditary and idiopathic neuropathy 08/19/2013  . HPV test positive   . Hypercholesteremia 08/19/2013  . Incomplete bladder emptying   . Microscopic hematuria   . MS (multiple sclerosis) (HCC)   . Muscle spasticity 05/21/2014  . Nonspecific findings on examination of urine    other  . Osteopenia   . Status post laparoscopic supracervical hysterectomy 11/26/2014  . Tobacco user 11/26/2014  . Wrist fracture     Past Surgical History:  Procedure Laterality Date  . bilateral tubal ligation  1996  . BREAST CYST EXCISION Left 2002  . KNEE SURGERY     right  . LAPAROSCOPIC SUPRACERVICAL HYSTERECTOMY  08/05/2013  . ORIF WRIST FRACTURE Left 01/17/2017   Procedure: OPEN REDUCTION INTERNAL FIXATION (ORIF) WRIST FRACTURE;  Surgeon: Lyndle Herrlich, MD;  Location: [facility] ORS;  Service: Orthopedics;  Laterality: Left;  . TUBAL LIGATION Bilateral   . VAGINAL HYSTERECTOMY   03/2006    Vitals:   08/03/17 1050  BP: 134/72  Pulse: 70  SpO2: 98%    Subjective Assessment - 08/03/17 1057    Subjective  Pt reports that she is doing well on this date. She denies pain today and reports appropriate fatigue after last therapy session. No specific questions or concerns    Patient is accompained by:  Family member mother and sister    Pertinent History  Pt is a 57 y/o F who presents with BLE weakness and difficulty ambulating due to h/o MS that was diagnosed in 1995. Pt denies any pain associated. Pt was admitted to the hospital in November 2018 due to MS exacerbation. Nov-Dec to HHPT-->[facility] until end of Jan. After rehab the pt was able to ambulate up to 100 ft with the RW. Pt has Bil AFO, her therapist at [facility] thinks she needs a new AFO as her feet still drag when she ambulates, pt reports HHPT mentioned a spring AFO. Pt has custom AFOs that were originally made 4 years ago. Goals: improve balance, to be able to get into the shower with her tub bench without assist, to be able to get up 2 steps at sister's home with railings on both sides but too far apart to reach both sides, to be able to drive adapted car. Pt is staying at  other sister's house with one step to get inside and currently picking up L leg with UEs and then stepping in. Pt steps down leading with LLE. Pt wants to work on walking side to side as this is challenging and she had not yet progressed to this at rehab. Pt able to get in/out the car with assist only to bring BLEs into the car. Hoping to drive by June or July with adapted car (already has one). Pt needs assist bringing LEs into and out of hospital bed. Pt able to perform sit>stand without assist from [facility] with a pillow on the seat for a boost. Pt denies any falls in the past 6 months. Pt has been increasing her time alone at home up to a few hours at a time to become more independent. Pt has both manual and power WC. Uses power WC at home, uses  manual WC out in community. Pt able to self propel manual WC. Pt needs assist putting pants around ankles but she can pull them up. Pt can put on her shoes but needs help getting her leg up to do so. Pt able to tie her shoelaces on her own. Pt has a tub/shower unit at home with a tub bench. No grab bars in the shower. Has hand held shower head. Pt currently taking a sponge bath. Pt has a regular height toilet with a BSC over top. Pt with h/o L wrist fx with no precautions, pt wore her soft brace to evaluation for comfort but reports her wrist has healed and is doing well following OT.     Limitations  Lifting;Standing;Walking;House hold activities    How long can you sit comfortably?  n/a    How long can you stand comfortably?  3 minutes without UE assist, limited by fatigue    How long can you walk comfortably?  100 ft, limited by fatigue    Patient Stated Goals  see above    Currently in Pain?  No/denies                  TREATMENT  Ther-ex  NuStep L4 x 5 minutes for warm-up during history, cues/assist to/from machine; Ambulation in // bars 10' x multiple lengths, theraband utilized on BLE to assist DF, knee flexion, and hip flexion Standing hip abduction x 10 bilateral; Sit to stand from elevated seat with UE assist on // bars 2 x 5; Standing mini squats 2 x 10; Pt wheeled to parking lot and assisted with car transfer. Cues provided for proper sequencing;             PT Education - 08/03/17 1221    Education provided  Yes    Education Details  Exercise form/technique    Person(s) Educated  Patient    Methods  Explanation    Comprehension  Verbalized understanding       PT Short Term Goals - 07/27/17 1153      PT SHORT TERM GOAL #1   Title  Pt will be completed HEP with min assist from caregiver at least 4 days/wk for improved carryover between sessions    Time  2    Period  Weeks    Status  New        PT Long Term Goals - 07/27/17 1153      PT LONG  TERM GOAL #1   Title  Pt's 5xSTS will impove to equal to or below 2 minutes to demonstrate improved balance and BLE strength  Baseline  3.02 minutes    Time  6    Period  Weeks    Status  New      PT LONG TERM GOAL #2   Title  Pt's will improve to at least 0.5 m/s for improved safety ambulating in home    Baseline  0.14 m/s    Time  8    Period  Weeks    Status  New      PT LONG TERM GOAL #3   Title  Pt's Bil hip F strength will improve to at least 2+/5 BLEs for improved gait mechanics and safety    Baseline  L: 2/5, R: 1/5    Time  8    Period  Weeks    Status  New      PT LONG TERM GOAL #4   Title  Pt's ABC Scale will improve to at least 40% to demonstrate improved balance    Baseline  28.13%      PT LONG TERM GOAL #5   Title  Pt will be able to perform car transfer without assist to work towards pt's goal of returning to driving    Baseline  Requires assist to bring BLEs into/out of car    Time  6    Period  Weeks    Status  New      Additional Long Term Goals   Additional Long Term Goals  Yes      PT LONG TERM GOAL #6   Title  Pt will be able to perform tub bench into/out of shower transfer without assist for improved independence with this task    Baseline  Taking sponge bath as she requires assist for transfer    Time  6    Period  Weeks    Status  New            Plan - 08/03/17 1222    Clinical Impression Statement  Pt continues to demonstrate excellent motivation with therapy on this date. She is able to increase her walking distance in parallel bars today. Utilized theraband to assist with dorsiflexion, knee flexion, and hip flexion during ambulation. Pt reports significant fatigue at end of session. Pt encouraged to continue HEP and follow-up as scheduled.     Rehab Potential  Good    PT Frequency  2x / week    PT Duration  8 weeks    PT Treatment/Interventions  ADLs/Self Care Home Management;Aquatic Therapy;Biofeedback;Cryotherapy;Electrical  Stimulation;Iontophoresis 4mg /ml Dexamethasone;Traction;Moist Heat;Ultrasound;DME Instruction;Gait training;Stair training;Functional mobility training;Therapeutic activities;Therapeutic exercise;Balance training;Neuromuscular re-education;Patient/family education;Orthotic Fit/Training;Wheelchair mobility training;Manual techniques;Compression bandaging;Passive range of motion;Dry needling;Energy conservation;Taping;Splinting    PT Next Visit Plan  assess need for new AFOs or other orthotic to assist with ambulation, initiate strengthening program, practice and improve technique for bed mobility and sit<>stand    PT Home Exercise Plan  to initiate next session    Consulted and Agree with Plan of Care  Patient       Patient will benefit from skilled therapeutic intervention in order to improve the following deficits and impairments:  Abnormal gait, Decreased activity tolerance, Decreased balance, Decreased coordination, Decreased endurance, Decreased knowledge of use of DME, Decreased mobility, Decreased range of motion, Decreased safety awareness, Decreased strength, Difficulty walking, Impaired perceived functional ability, Impaired sensation, Impaired tone, Impaired UE functional use, Improper body mechanics, Postural dysfunction  Visit Diagnosis: Muscle weakness (generalized)  Unsteadiness on feet     Problem List Patient Active Problem List   Diagnosis  Date Noted  . Wrist fracture 01/16/2017  . Health care maintenance 01/24/2016  . Blood pressure elevated without history of HTN 10/25/2015  . Multiple sclerosis (HCC) 10/02/2015  . Chronic left shoulder pain 07/19/2015  . Multiple sclerosis exacerbation (HCC) 07/14/2015  . MS (multiple sclerosis) (HCC) 11/26/2014  . Increased body mass index 11/26/2014  . HPV test positive 11/26/2014  . Status post laparoscopic supracervical hysterectomy 11/26/2014  . Galactorrhea 11/26/2014  . Tobacco user 11/26/2014  . Back ache 05/21/2014  .  Adiposity 05/21/2014  . Disordered sleep 05/21/2014  . Muscle spasticity 05/21/2014  . Calculus of kidney 12/09/2013  . Renal colic 12/09/2013  . Hypercholesteremia 08/19/2013  . Hereditary and idiopathic neuropathy 08/19/2013  . Hypercholesterolemia without hypertriglyceridemia 08/19/2013  . Bladder infection, chronic 07/25/2012  . Disorder of bladder function 07/25/2012  . Incomplete bladder emptying 07/25/2012  . Microscopic hematuria 07/25/2012   Lynnea Maizes PT, DPT   Lucillia Corson 08/03/2017, 12:27 PM  [facility] MAIN [facility] SERVICES [address] [place], [place], [address] Phone: [phone]   Fax:  [phone]  Name: Joslynn Joshua Bondar MRN: [id number] Date of Birth: 1960-12-07

## 2017-08-06 ENCOUNTER — Other Ambulatory Visit: Payer: Self-pay | Admitting: *Deleted

## 2017-08-06 ENCOUNTER — Encounter: Payer: Self-pay | Admitting: *Deleted

## 2017-08-06 NOTE — Patient Outreach (Signed)
08/06/2017   Orly Volkert 09-23-1960 [id number]  Successful telephone encounter to [facility], 57 year old female- follow up on current clinical status.  This RN CM followed pt for transition of care/recent SNF discharge on 06/02/17, admitted to SNF  From home on 04/23/17.   Most recent hospitalization October 29-November 7,2018 for MS exacerbation.  Transition of care program completed 07/05/17. Spoke with pt, HIPAA identifiers verified.  Pt reports doing good, started Outpatient therapy last week, to go  Twice a week Tuesdays and Fridays.   Pt reports balance/mobility/strength getting better, able to transfer  Getting out of bed/into bathroom and car, Outpatient therapy goal - to continue to work on her balance.  Pt reports taking all of her medications, no upcoming MD appointments.   Pt reports did receive a new  Manual wheelchair last week, lighter than her previous one.   RN CM discussed with pt plan to discharge from [facility], no further case management  Needs to which pt agreed. Pt verified has RN CM's contact information, [facility]'s main office number if  Needs arise in the future.   Plan:  As discussed with pt, plan to discharge from [facility] CM services, close case.           Plan to inform Dr. Dareen Piano of discharge, send case closure letter.    Shayne Alken.   [place] RN CCM [facility] Care Management  [phone]

## 2017-08-07 ENCOUNTER — Encounter: Payer: Self-pay | Admitting: Physical Therapy

## 2017-08-07 ENCOUNTER — Ambulatory Visit: Payer: PPO | Attending: Neurology | Admitting: Physical Therapy

## 2017-08-07 DIAGNOSIS — M5412 Radiculopathy, cervical region: Secondary | ICD-10-CM | POA: Insufficient documentation

## 2017-08-07 DIAGNOSIS — M6281 Muscle weakness (generalized): Secondary | ICD-10-CM | POA: Diagnosis not present

## 2017-08-07 DIAGNOSIS — R2681 Unsteadiness on feet: Secondary | ICD-10-CM | POA: Insufficient documentation

## 2017-08-07 DIAGNOSIS — R2689 Other abnormalities of gait and mobility: Secondary | ICD-10-CM | POA: Insufficient documentation

## 2017-08-07 NOTE — Therapy (Signed)
[place] [facility] MAIN [facility] SERVICES [address] [place], [place], [address] Phone: [phone]   Fax:  [phone]  Physical Therapy Treatment  Patient Details  Name: Lisa Williams MRN: [id number] Date of Birth: 04/10/1961 Referring Provider: Theora Master, MD   Encounter Date: 08/07/2017  PT End of Session - 08/07/17 1000    Visit Number  4    Number of Visits  17    Date for PT Re-Evaluation  09/21/17    PT Start Time  0950    PT Stop Time  1030    PT Time Calculation (min)  40 min    Equipment Utilized During Treatment  Gait belt    Activity Tolerance  Patient tolerated treatment well;Patient limited by fatigue    Behavior During Therapy  [facility] for tasks assessed/performed       Past Medical History:  Diagnosis Date  . Abdominal pain, right upper quadrant   . Back pain   . Calculus of kidney 12/09/2013  . Chronic back pain    unspecified  . Chronic left shoulder pain 07/19/2015  . Functional disorder of bladder    other  . Galactorrhea 11/26/2014   Chronic   . Hereditary and idiopathic neuropathy 08/19/2013  . HPV test positive   . Hypercholesteremia 08/19/2013  . Incomplete bladder emptying   . Microscopic hematuria   . MS (multiple sclerosis) (HCC)   . Muscle spasticity 05/21/2014  . Nonspecific findings on examination of urine    other  . Osteopenia   . Status post laparoscopic supracervical hysterectomy 11/26/2014  . Tobacco user 11/26/2014  . Wrist fracture     Past Surgical History:  Procedure Laterality Date  . bilateral tubal ligation  1996  . BREAST CYST EXCISION Left 2002  . KNEE SURGERY     right  . LAPAROSCOPIC SUPRACERVICAL HYSTERECTOMY  08/05/2013  . ORIF WRIST FRACTURE Left 01/17/2017   Procedure: OPEN REDUCTION INTERNAL FIXATION (ORIF) WRIST FRACTURE;  Surgeon: Lyndle Herrlich, MD;  Location: [facility] ORS;  Service: Orthopedics;  Laterality: Left;  . TUBAL LIGATION Bilateral   . VAGINAL HYSTERECTOMY   03/2006    There were no vitals filed for this visit.  Subjective Assessment - 08/07/17 1000    Subjective  Pt reports that she is doing well on this date. She denies pain today and reports appropriate fatigue after last therapy session. No specific questions or concerns    Patient is accompained by:  Family member mother and sister    Pertinent History  Pt is a 57 y/o F who presents with BLE weakness and difficulty ambulating due to h/o MS that was diagnosed in 1995. Pt denies any pain associated. Pt was admitted to the hospital in November 2018 due to MS exacerbation. Nov-Dec to HHPT-->[facility] until end of Jan. After rehab the pt was able to ambulate up to 100 ft with the RW. Pt has Bil AFO, her therapist at [facility] thinks she needs a new AFO as her feet still drag when she ambulates, pt reports HHPT mentioned a spring AFO. Pt has custom AFOs that were originally made 4 years ago. Goals: improve balance, to be able to get into the shower with her tub bench without assist, to be able to get up 2 steps at sister's home with railings on both sides but too far apart to reach both sides, to be able to drive adapted car. Pt is staying at other sister's house with one step to get  inside and currently picking up L leg with UEs and then stepping in. Pt steps down leading with LLE. Pt wants to work on walking side to side as this is challenging and she had not yet progressed to this at rehab. Pt able to get in/out the car with assist only to bring BLEs into the car. Hoping to drive by June or July with adapted car (already has one). Pt needs assist bringing LEs into and out of hospital bed. Pt able to perform sit>stand without assist from [facility] with a pillow on the seat for a boost. Pt denies any falls in the past 6 months. Pt has been increasing her time alone at home up to a few hours at a time to become more independent. Pt has both manual and power WC. Uses power WC at home, uses manual WC out in  community. Pt able to self propel manual WC. Pt needs assist putting pants around ankles but she can pull them up. Pt can put on her shoes but needs help getting her leg up to do so. Pt able to tie her shoelaces on her own. Pt has a tub/shower unit at home with a tub bench. No grab bars in the shower. Has hand held shower head. Pt currently taking a sponge bath. Pt has a regular height toilet with a BSC over top. Pt with h/o L wrist fx with no precautions, pt wore her soft brace to evaluation for comfort but reports her wrist has healed and is doing well following OT.     Limitations  Lifting;Standing;Walking;House hold activities    How long can you sit comfortably?  n/a    How long can you stand comfortably?  3 minutes without UE assist, limited by fatigue    How long can you walk comfortably?  100 ft, limited by fatigue    Patient Stated Goals  see above    Currently in Pain?  No/denies    Multiple Pain Sites  No       Treatment: Gait training with RW and CGA with B AFO's, LLE sliding along the floor with sock on top of shoe for 40 feet x 3  Therapeutic exercise: Nu-step x 5 mins ( warm up)  Patient needs assist with BLE for sit to supine bed mobility Sit to stand x 5 from chair with arms and 2 foam cushions with CGA SAQ x 10 x 2 BLE with 3 sec hold LLE and 2 sec holds RLE Hip add squeeze with pillow x 10  Hip abd/ER with YTB x 10  hooklying hip ER single side x 10 , on left and right with opposite side stabilized and supported   Patient is able to perform all exercises with cues for safety and cues for technique. Patient arrives 5 mins late due to depending on her sister for a ride. She reports that she likes to be on time and is a bit irritated at the beginning of the session and apologizes to the therapist as the session is closer to the end; she also voices that she wishes she could have the same therapist each visit because she feels they would know her situation better. Patient is  fatigued after session and is able to do a squat pivot transfer from mat table to [facility] with CGA.                       PT Education - 08/07/17 1000    Education provided  Yes    Education Details  HEP    Person(s) Educated  Patient    Methods  Explanation;Demonstration;Tactile cues    Comprehension  Verbalized understanding;Returned demonstration       PT Short Term Goals - 07/27/17 1153      PT SHORT TERM GOAL #1   Title  Pt will be completed HEP with min assist from caregiver at least 4 days/wk for improved carryover between sessions    Time  2    Period  Weeks    Status  New        PT Long Term Goals - 07/27/17 1153      PT LONG TERM GOAL #1   Title  Pt's 5xSTS will impove to equal to or below 2 minutes to demonstrate improved balance and BLE strength    Baseline  3.02 minutes    Time  6    Period  Weeks    Status  New      PT LONG TERM GOAL #2   Title  Pt's will improve to at least 0.5 m/s for improved safety ambulating in home    Baseline  0.14 m/s    Time  8    Period  Weeks    Status  New      PT LONG TERM GOAL #3   Title  Pt's Bil hip F strength will improve to at least 2+/5 BLEs for improved gait mechanics and safety    Baseline  L: 2/5, R: 1/5    Time  8    Period  Weeks    Status  New      PT LONG TERM GOAL #4   Title  Pt's ABC Scale will improve to at least 40% to demonstrate improved balance    Baseline  28.13%      PT LONG TERM GOAL #5   Title  Pt will be able to perform car transfer without assist to work towards pt's goal of returning to driving    Baseline  Requires assist to bring BLEs into/out of car    Time  6    Period  Weeks    Status  New      Additional Long Term Goals   Additional Long Term Goals  Yes      PT LONG TERM GOAL #6   Title  Pt will be able to perform tub bench into/out of shower transfer without assist for improved independence with this task    Baseline  Taking sponge bath as she requires assist  for transfer    Time  6    Period  Weeks    Status  New            Plan - 08/07/17 1001    Clinical Impression Statement  Patient reports that she has not been doing her HEP and was educated on the importance of doing them at home. She performs  therapeutic exercises and gait training to improve strength and functional independence. She will continue to benefit from skilled PT to improve quality of life and return to PLOF.     Rehab Potential  Good    PT Frequency  2x / week    PT Duration  8 weeks    PT Treatment/Interventions  ADLs/Self Care Home Management;Aquatic Therapy;Biofeedback;Cryotherapy;Electrical Stimulation;Iontophoresis 4mg /ml Dexamethasone;Traction;Moist Heat;Ultrasound;DME Instruction;Gait training;Stair training;Functional mobility training;Therapeutic activities;Therapeutic exercise;Balance training;Neuromuscular re-education;Patient/family education;Orthotic Fit/Training;Wheelchair mobility training;Manual techniques;Compression bandaging;Passive range of motion;Dry needling;Energy conservation;Taping;Splinting    PT Next Visit Plan  assess need for new AFOs or other orthotic to assist with ambulation, initiate strengthening program, practice and improve technique for bed mobility and sit<>stand    PT Home Exercise Plan  to initiate next session    Consulted and Agree with Plan of Care  Patient       Patient will benefit from skilled therapeutic intervention in order to improve the following deficits and impairments:  Abnormal gait, Decreased activity tolerance, Decreased balance, Decreased coordination, Decreased endurance, Decreased knowledge of use of DME, Decreased mobility, Decreased range of motion, Decreased safety awareness, Decreased strength, Difficulty walking, Impaired perceived functional ability, Impaired sensation, Impaired tone, Impaired UE functional use, Improper body mechanics, Postural dysfunction  Visit Diagnosis: Muscle weakness  (generalized)  Unsteadiness on feet     Problem List Patient Active Problem List   Diagnosis Date Noted  . Wrist fracture 01/16/2017  . Health care maintenance 01/24/2016  . Blood pressure elevated without history of HTN 10/25/2015  . Multiple sclerosis (HCC) 10/02/2015  . Chronic left shoulder pain 07/19/2015  . Multiple sclerosis exacerbation (HCC) 07/14/2015  . MS (multiple sclerosis) (HCC) 11/26/2014  . Increased body mass index 11/26/2014  . HPV test positive 11/26/2014  . Status post laparoscopic supracervical hysterectomy 11/26/2014  . Galactorrhea 11/26/2014  . Tobacco user 11/26/2014  . Back ache 05/21/2014  . Adiposity 05/21/2014  . Disordered sleep 05/21/2014  . Muscle spasticity 05/21/2014  . Calculus of kidney 12/09/2013  . Renal colic 12/09/2013  . Hypercholesteremia 08/19/2013  . Hereditary and idiopathic neuropathy 08/19/2013  . Hypercholesterolemia without hypertriglyceridemia 08/19/2013  . Bladder infection, chronic 07/25/2012  . Disorder of bladder function 07/25/2012  . Incomplete bladder emptying 07/25/2012  . Microscopic hematuria 07/25/2012    Ezekiel Ina, PT DPT 08/07/2017, 10:09 AM  [place] [facility] MAIN [facility] SERVICES [address] [place], [place], [address] Phone: [phone]   Fax:  [phone]  Name: Lisa Williams MRN: [id number] Date of Birth: 1960-07-09

## 2017-08-10 ENCOUNTER — Encounter: Payer: Self-pay | Admitting: Physical Therapy

## 2017-08-10 ENCOUNTER — Ambulatory Visit: Payer: PPO | Admitting: Physical Therapy

## 2017-08-10 VITALS — BP 141/78 | HR 70

## 2017-08-10 DIAGNOSIS — R2689 Other abnormalities of gait and mobility: Secondary | ICD-10-CM

## 2017-08-10 DIAGNOSIS — M6281 Muscle weakness (generalized): Secondary | ICD-10-CM | POA: Diagnosis not present

## 2017-08-10 DIAGNOSIS — R2681 Unsteadiness on feet: Secondary | ICD-10-CM

## 2017-08-10 NOTE — Therapy (Signed)
[place] [facility] MAIN [facility] SERVICES [address] [place], [place], [address] Phone: [phone]   Fax:  [phone]  Physical Therapy Treatment  Patient Details  Name: Lisa Williams MRN: [id number] Date of Birth: 08/23/1960 Referring Provider: Theora Master, MD   Encounter Date: 08/10/2017  PT End of Session - 08/10/17 1054    Visit Number  5    Number of Visits  17    Date for PT Re-Evaluation  09/21/17    PT Start Time  1056    PT Stop Time  1148    PT Time Calculation (min)  52 min    Equipment Utilized During Treatment  Gait belt    Activity Tolerance  Patient tolerated treatment well;Patient limited by fatigue    Behavior During Therapy  [facility] for tasks assessed/performed       Past Medical History:  Diagnosis Date  . Abdominal pain, right upper quadrant   . Back pain   . Calculus of kidney 12/09/2013  . Chronic back pain    unspecified  . Chronic left shoulder pain 07/19/2015  . Functional disorder of bladder    other  . Galactorrhea 11/26/2014   Chronic   . Hereditary and idiopathic neuropathy 08/19/2013  . HPV test positive   . Hypercholesteremia 08/19/2013  . Incomplete bladder emptying   . Microscopic hematuria   . MS (multiple sclerosis) (HCC)   . Muscle spasticity 05/21/2014  . Nonspecific findings on examination of urine    other  . Osteopenia   . Status post laparoscopic supracervical hysterectomy 11/26/2014  . Tobacco user 11/26/2014  . Wrist fracture     Past Surgical History:  Procedure Laterality Date  . bilateral tubal ligation  1996  . BREAST CYST EXCISION Left 2002  . KNEE SURGERY     right  . LAPAROSCOPIC SUPRACERVICAL HYSTERECTOMY  08/05/2013  . ORIF WRIST FRACTURE Left 01/17/2017   Procedure: OPEN REDUCTION INTERNAL FIXATION (ORIF) WRIST FRACTURE;  Surgeon: Lyndle Herrlich, MD;  Location: [facility] ORS;  Service: Orthopedics;  Laterality: Left;  . TUBAL LIGATION Bilateral   . VAGINAL HYSTERECTOMY   03/2006    Vitals:   08/10/17 1105  BP: (!) 141/78  Pulse: 70  SpO2: 100%    Subjective Assessment - 08/10/17 1102    Subjective  Pt reports she is doing well.  She didn't do her HEP this week and admits that she was lazy but will do her HEP next week.    Patient is accompained by:  Family member mother and sister    Pertinent History  Pt is a 57 y/o F who presents with BLE weakness and difficulty ambulating due to h/o MS that was diagnosed in 1995. Pt denies any pain associated. Pt was admitted to the hospital in November 2018 due to MS exacerbation. Nov-Dec to HHPT-->[facility] until end of Jan. After rehab the pt was able to ambulate up to 100 ft with the RW. Pt has Bil AFO, her therapist at [facility] thinks she needs a new AFO as her feet still drag when she ambulates, pt reports HHPT mentioned a spring AFO. Pt has custom AFOs that were originally made 4 years ago. Goals: improve balance, to be able to get into the shower with her tub bench without assist, to be able to get up 2 steps at sister's home with railings on both sides but too far apart to reach both sides, to be able to drive adapted car. Pt is staying  at other sister's house with one step to get inside and currently picking up L leg with UEs and then stepping in. Pt steps down leading with LLE. Pt wants to work on walking side to side as this is challenging and she had not yet progressed to this at rehab. Pt able to get in/out the car with assist only to bring BLEs into the car. Hoping to drive by June or July with adapted car (already has one). Pt needs assist bringing LEs into and out of hospital bed. Pt able to perform sit>stand without assist from [facility] with a pillow on the seat for a boost. Pt denies any falls in the past 6 months. Pt has been increasing her time alone at home up to a few hours at a time to become more independent. Pt has both manual and power WC. Uses power WC at home, uses manual WC out in community. Pt able to  self propel manual WC. Pt needs assist putting pants around ankles but she can pull them up. Pt can put on her shoes but needs help getting her leg up to do so. Pt able to tie her shoelaces on her own. Pt has a tub/shower unit at home with a tub bench. No grab bars in the shower. Has hand held shower head. Pt currently taking a sponge bath. Pt has a regular height toilet with a BSC over top. Pt with h/o L wrist fx with no precautions, pt wore her soft brace to evaluation for comfort but reports her wrist has healed and is doing well following OT.     Limitations  Lifting;Standing;Walking;House hold activities    How long can you sit comfortably?  n/a    How long can you stand comfortably?  3 minutes without UE assist, limited by fatigue    How long can you walk comfortably?  100 ft, limited by fatigue    Patient Stated Goals  see above    Currently in Pain?  No/denies        TREATMENT   Ambulation in // bars 10' x6 lengths, theraband utilized on BLE to assist DF, knee flexion, and hip flexion. Cues to avoid trunk flexion and to initiate step by moving pelvis forward.   Stepping forward over tape on floor with emphasis on floor clearance 2x10 each LE with BUE support   Standing hip abduction x 10 bilateral   Seated BLE coordination exercise with moving target, limited by fatigue   Sit to stand from elevated seat with UE assist on // bars x5   Standing mini squats x 10   Pt wheeled to parking lot and assisted with car transfer. Cues provided for proper sequencing                         PT Education - 08/10/17 1054    Education provided  Yes    Education Details  Exercise technique    Person(s) Educated  Patient    Methods  Explanation;Demonstration;Verbal cues    Comprehension  Verbalized understanding;Returned demonstration;Verbal cues required;Need further instruction       PT Short Term Goals - 07/27/17 1153      PT SHORT TERM GOAL #1   Title  Pt will be  completed HEP with min assist from caregiver at least 4 days/wk for improved carryover between sessions    Time  2    Period  Weeks    Status  New  PT Long Term Goals - 07/27/17 1153      PT LONG TERM GOAL #1   Title  Pt's 5xSTS will impove to equal to or below 2 minutes to demonstrate improved balance and BLE strength    Baseline  3.02 minutes    Time  6    Period  Weeks    Status  New      PT LONG TERM GOAL #2   Title  Pt's will improve to at least 0.5 m/s for improved safety ambulating in home    Baseline  0.14 m/s    Time  8    Period  Weeks    Status  New      PT LONG TERM GOAL #3   Title  Pt's Bil hip F strength will improve to at least 2+/5 BLEs for improved gait mechanics and safety    Baseline  L: 2/5, R: 1/5    Time  8    Period  Weeks    Status  New      PT LONG TERM GOAL #4   Title  Pt's ABC Scale will improve to at least 40% to demonstrate improved balance    Baseline  28.13%      PT LONG TERM GOAL #5   Title  Pt will be able to perform car transfer without assist to work towards pt's goal of returning to driving    Baseline  Requires assist to bring BLEs into/out of car    Time  6    Period  Weeks    Status  New      Additional Long Term Goals   Additional Long Term Goals  Yes      PT LONG TERM GOAL #6   Title  Pt will be able to perform tub bench into/out of shower transfer without assist for improved independence with this task    Baseline  Taking sponge bath as she requires assist for transfer    Time  6    Period  Weeks    Status  New            Plan - 08/10/17 1105    Clinical Impression Statement  Practiced ambulating with theraband assist for Bil hip F, knee F, and DF with emphasis on endurance with each gait training activity.  Pt requires seated rest breaks between exercises.  Instructed pt in stepping activity forward with focus on foot clearance BIl, pt fatigues quickly with this.  Pt will benefit from continued skilled  PT inteventions for improved gait mechanics, strength, and independence.      Rehab Potential  Good    PT Frequency  2x / week    PT Duration  8 weeks    PT Treatment/Interventions  ADLs/Self Care Home Management;Aquatic Therapy;Biofeedback;Cryotherapy;Electrical Stimulation;Iontophoresis 4mg /ml Dexamethasone;Traction;Moist Heat;Ultrasound;DME Instruction;Gait training;Stair training;Functional mobility training;Therapeutic activities;Therapeutic exercise;Balance training;Neuromuscular re-education;Patient/family education;Orthotic Fit/Training;Wheelchair mobility training;Manual techniques;Compression bandaging;Passive range of motion;Dry needling;Energy conservation;Taping;Splinting    PT Next Visit Plan  assess need for new AFOs or other orthotic to assist with ambulation, initiate strengthening program, practice and improve technique for bed mobility and sit<>stand    PT Home Exercise Plan  to initiate next session    Consulted and Agree with Plan of Care  Patient       Patient will benefit from skilled therapeutic intervention in order to improve the following deficits and impairments:  Abnormal gait, Decreased activity tolerance, Decreased balance, Decreased coordination, Decreased endurance, Decreased knowledge of use of  DME, Decreased mobility, Decreased range of motion, Decreased safety awareness, Decreased strength, Difficulty walking, Impaired perceived functional ability, Impaired sensation, Impaired tone, Impaired UE functional use, Improper body mechanics, Postural dysfunction  Visit Diagnosis: Muscle weakness (generalized)  Unsteadiness on feet  Other abnormalities of gait and mobility     Problem List Patient Active Problem List   Diagnosis Date Noted  . Wrist fracture 01/16/2017  . Health care maintenance 01/24/2016  . Blood pressure elevated without history of HTN 10/25/2015  . Multiple sclerosis (HCC) 10/02/2015  . Chronic left shoulder pain 07/19/2015  . Multiple  sclerosis exacerbation (HCC) 07/14/2015  . MS (multiple sclerosis) (HCC) 11/26/2014  . Increased body mass index 11/26/2014  . HPV test positive 11/26/2014  . Status post laparoscopic supracervical hysterectomy 11/26/2014  . Galactorrhea 11/26/2014  . Tobacco user 11/26/2014  . Back ache 05/21/2014  . Adiposity 05/21/2014  . Disordered sleep 05/21/2014  . Muscle spasticity 05/21/2014  . Calculus of kidney 12/09/2013  . Renal colic 12/09/2013  . Hypercholesteremia 08/19/2013  . Hereditary and idiopathic neuropathy 08/19/2013  . Hypercholesterolemia without hypertriglyceridemia 08/19/2013  . Bladder infection, chronic 07/25/2012  . Disorder of bladder function 07/25/2012  . Incomplete bladder emptying 07/25/2012  . Microscopic hematuria 07/25/2012     Encarnacion Chu PT, DPT 08/10/2017, 12:00 PM  [place] [facility] MAIN [facility] SERVICES [address] [place], [place], [address] Phone: [phone]   Fax:  [phone]  Name: Lisa Williams MRN: [id number] Date of Birth: 1960/10/03

## 2017-08-12 DIAGNOSIS — G35 Multiple sclerosis: Secondary | ICD-10-CM | POA: Diagnosis not present

## 2017-08-15 ENCOUNTER — Ambulatory Visit: Payer: PPO | Admitting: Physical Therapy

## 2017-08-15 ENCOUNTER — Encounter: Payer: Self-pay | Admitting: Physical Therapy

## 2017-08-15 DIAGNOSIS — R2681 Unsteadiness on feet: Secondary | ICD-10-CM

## 2017-08-15 DIAGNOSIS — M6281 Muscle weakness (generalized): Secondary | ICD-10-CM

## 2017-08-15 DIAGNOSIS — R2689 Other abnormalities of gait and mobility: Secondary | ICD-10-CM

## 2017-08-15 NOTE — Therapy (Signed)
[facility] MAIN [facility] SERVICES [address] [place], [place], [address] Phone: [phone]   Fax:  [phone]  Physical Therapy Treatment  Patient Details  Name: Lisa Williams MRN: [id number] Date of Birth: [birth year] Referring Provider: Theora Master, MD   Encounter Date: 08/15/2017  PT End of Session - 08/15/17 1119    Visit Number  6    Number of Visits  17    Date for PT Re-Evaluation  09/21/17    PT Start Time  1100    PT Stop Time  1145    PT Time Calculation (min)  45 min    Equipment Utilized During Treatment  Gait belt    Activity Tolerance  Patient tolerated treatment well;Patient limited by fatigue    Behavior During Therapy  [facility] for tasks assessed/performed       Past Medical History:  Diagnosis Date  . Abdominal pain, right upper quadrant   . Back pain   . Calculus of kidney 12/09/2013  . Chronic back pain    unspecified  . Chronic left shoulder pain 07/19/2015  . Functional disorder of bladder    other  . Galactorrhea 11/26/2014   Chronic   . Hereditary and idiopathic neuropathy 08/19/2013  . HPV test positive   . Hypercholesteremia 08/19/2013  . Incomplete bladder emptying   . Microscopic hematuria   . MS (multiple sclerosis) (HCC)   . Muscle spasticity 05/21/2014  . Nonspecific findings on examination of urine    other  . Osteopenia   . Status post laparoscopic supracervical hysterectomy 11/26/2014  . Tobacco user 11/26/2014  . Wrist fracture     Past Surgical History:  Procedure Laterality Date  . bilateral tubal ligation  1996  . BREAST CYST EXCISION Left 2002  . KNEE SURGERY     right  . LAPAROSCOPIC SUPRACERVICAL HYSTERECTOMY  08/05/2013  . ORIF WRIST FRACTURE Left 01/17/2017   Procedure: OPEN REDUCTION INTERNAL FIXATION (ORIF) WRIST FRACTURE;  Surgeon: Lyndle Herrlich, MD;  Location: [facility] ORS;  Service: Orthopedics;  Laterality: Left;  . TUBAL LIGATION Bilateral   . VAGINAL HYSTERECTOMY   03/2006    There were no vitals filed for this visit.  Subjective Assessment - 08/15/17 1118    Subjective  Pt reports she is doing well.  she reports doing her HEP.    Currently in Pain?  No/denies    Multiple Pain Sites  No       Treatment: Warm up with nu-step x 5 mins SAQ over bolster BLE x 10 x 2  Hip abd/add BLE with assist, sliding board and pillowcase x 10 x 2  Bridges over bolster x 10 x 2  Hooklying single leg clam x 10 x 2 BLE Sit to stand from 24 inches  X 5 reps x 2 with CGA  Gait training with Rw and CGA with wc following 30 feet and 55 feet.                         PT Education - 08/15/17 1119    Education provided  Yes    Education Details  HEP    Person(s) Educated  Patient    Methods  Explanation;Demonstration;Tactile cues;Verbal cues    Comprehension  Verbalized understanding;Returned demonstration       PT Short Term Goals - 07/27/17 1153      PT SHORT TERM GOAL #1   Title  Pt will be completed HEP with  min assist from caregiver at least 4 days/wk for improved carryover between sessions    Time  2    Period  Weeks    Status  New        PT Long Term Goals - 07/27/17 1153      PT LONG TERM GOAL #1   Title  Pt's 5xSTS will impove to equal to or below 2 minutes to demonstrate improved balance and BLE strength    Baseline  3.02 minutes    Time  6    Period  Weeks    Status  New      PT LONG TERM GOAL #2   Title  Pt's will improve to at least 0.5 m/s for improved safety ambulating in home    Baseline  0.14 m/s    Time  8    Period  Weeks    Status  New      PT LONG TERM GOAL #3   Title  Pt's Bil hip F strength will improve to at least 2+/5 BLEs for improved gait mechanics and safety    Baseline  L: 2/5, R: 1/5    Time  8    Period  Weeks    Status  New      PT LONG TERM GOAL #4   Title  Pt's ABC Scale will improve to at least 40% to demonstrate improved balance    Baseline  28.13%      PT LONG TERM GOAL #5    Title  Pt will be able to perform car transfer without assist to work towards pt's goal of returning to driving    Baseline  Requires assist to bring BLEs into/out of car    Time  6    Period  Weeks    Status  New      Additional Long Term Goals   Additional Long Term Goals  Yes      PT LONG TERM GOAL #6   Title  Pt will be able to perform tub bench into/out of shower transfer without assist for improved independence with this task    Baseline  Taking sponge bath as she requires assist for transfer    Time  6    Period  Weeks    Status  New            Plan - 08/15/17 1121    Clinical Impression Statement  Patient has unsteadiness with transfers sit to stand and BLE and core weakness.  She performed LE and core strengthening exercises with assist.Patinet performs gait with RW and CGA. She will continue to benefit from skilled PT to improve strength and mobility.    Rehab Potential  Good    PT Frequency  2x / week    PT Duration  8 weeks    PT Treatment/Interventions  ADLs/Self Care Home Management;Aquatic Therapy;Biofeedback;Cryotherapy;Electrical Stimulation;Iontophoresis 4mg /ml Dexamethasone;Traction;Moist Heat;Ultrasound;DME Instruction;Gait training;Stair training;Functional mobility training;Therapeutic activities;Therapeutic exercise;Balance training;Neuromuscular re-education;Patient/family education;Orthotic Fit/Training;Wheelchair mobility training;Manual techniques;Compression bandaging;Passive range of motion;Dry needling;Energy conservation;Taping;Splinting    PT Next Visit Plan  assess need for new AFOs or other orthotic to assist with ambulation, initiate strengthening program, practice and improve technique for bed mobility and sit<>stand    PT Home Exercise Plan  to initiate next session    Consulted and Agree with Plan of Care  Patient       Patient will benefit from skilled therapeutic intervention in order to improve the following deficits and impairments:   Abnormal  gait, Decreased activity tolerance, Decreased balance, Decreased coordination, Decreased endurance, Decreased knowledge of use of DME, Decreased mobility, Decreased range of motion, Decreased safety awareness, Decreased strength, Difficulty walking, Impaired perceived functional ability, Impaired sensation, Impaired tone, Impaired UE functional use, Improper body mechanics, Postural dysfunction  Visit Diagnosis: Muscle weakness (generalized)  Unsteadiness on feet  Other abnormalities of gait and mobility     Problem List Patient Active Problem List   Diagnosis Date Noted  . Wrist fracture 01/16/2017  . Health care maintenance 01/24/2016  . Blood pressure elevated without history of HTN 10/25/2015  . Multiple sclerosis (HCC) 10/02/2015  . Chronic left shoulder pain 07/19/2015  . Multiple sclerosis exacerbation (HCC) 07/14/2015  . MS (multiple sclerosis) (HCC) 11/26/2014  . Increased body mass index 11/26/2014  . HPV test positive 11/26/2014  . Status post laparoscopic supracervical hysterectomy 11/26/2014  . Galactorrhea 11/26/2014  . Tobacco user 11/26/2014  . Back ache 05/21/2014  . Adiposity 05/21/2014  . Disordered sleep 05/21/2014  . Muscle spasticity 05/21/2014  . Calculus of kidney 12/09/2013  . Renal colic 12/09/2013  . Hypercholesteremia 08/19/2013  . Hereditary and idiopathic neuropathy 08/19/2013  . Hypercholesterolemia without hypertriglyceridemia 08/19/2013  . Bladder infection, chronic 07/25/2012  . Disorder of bladder function 07/25/2012  . Incomplete bladder emptying 07/25/2012  . Microscopic hematuria 07/25/2012    Ezekiel Ina , PT DPT 08/15/2017, 12:24 PM  [place] [facility] MAIN [facility] SERVICES [address] [place], [place], [address] Phone: [phone]   Fax:  [phone]  Name: Naphtali Riede Belgrave MRN: [id number] Date of Birth: November 20, 1960

## 2017-08-21 ENCOUNTER — Ambulatory Visit: Payer: PPO

## 2017-08-21 DIAGNOSIS — M6281 Muscle weakness (generalized): Secondary | ICD-10-CM | POA: Diagnosis not present

## 2017-08-21 DIAGNOSIS — R2689 Other abnormalities of gait and mobility: Secondary | ICD-10-CM

## 2017-08-21 DIAGNOSIS — R2681 Unsteadiness on feet: Secondary | ICD-10-CM

## 2017-08-21 NOTE — Therapy (Signed)
[place] [facility] MAIN [facility] SERVICES [address] [place], [place], [address] Phone: [phone]   Fax:  [phone]  Physical Therapy Treatment  Patient Details  Name: Lisa Williams MRN: [id number] Date of Birth: Jun 04, 1960 Referring Provider: Theora Master, MD   Encounter Date: 08/21/2017  PT End of Session - 08/21/17 1056    Visit Number  7    Number of Visits  17    Date for PT Re-Evaluation  09/21/17    PT Start Time  1030    PT Stop Time  1115    PT Time Calculation (min)  45 min    Equipment Utilized During Treatment  Gait belt    Activity Tolerance  Patient tolerated treatment well;Patient limited by fatigue    Behavior During Therapy  [facility] for tasks assessed/performed       Past Medical History:  Diagnosis Date  . Abdominal pain, right upper quadrant   . Back pain   . Calculus of kidney 12/09/2013  . Chronic back pain    unspecified  . Chronic left shoulder pain 07/19/2015  . Functional disorder of bladder    other  . Galactorrhea 11/26/2014   Chronic   . Hereditary and idiopathic neuropathy 08/19/2013  . HPV test positive   . Hypercholesteremia 08/19/2013  . Incomplete bladder emptying   . Microscopic hematuria   . MS (multiple sclerosis) (HCC)   . Muscle spasticity 05/21/2014  . Nonspecific findings on examination of urine    other  . Osteopenia   . Status post laparoscopic supracervical hysterectomy 11/26/2014  . Tobacco user 11/26/2014  . Wrist fracture     Past Surgical History:  Procedure Laterality Date  . bilateral tubal ligation  1996  . BREAST CYST EXCISION Left 2002  . KNEE SURGERY     right  . LAPAROSCOPIC SUPRACERVICAL HYSTERECTOMY  08/05/2013  . ORIF WRIST FRACTURE Left 01/17/2017   Procedure: OPEN REDUCTION INTERNAL FIXATION (ORIF) WRIST FRACTURE;  Surgeon: Lyndle Herrlich, MD;  Location: [facility] ORS;  Service: Orthopedics;  Laterality: Left;  . TUBAL LIGATION Bilateral   . VAGINAL HYSTERECTOMY   03/2006    There were no vitals filed for this visit. 148/69 pulse 79  Subjective Assessment - 08/21/17 1034    Subjective  Patient reports doing her bed exercises, can stand at sink and brush teeth now.     Pertinent History  Pt is a 57 y/o F who presents with BLE weakness and difficulty ambulating due to h/o MS that was diagnosed in 1995. Pt denies any pain associated. Pt was admitted to the hospital in November 2018 due to MS exacerbation. Nov-Dec to [facility] until end of Jan. After rehab the pt was able to ambulate up to 100 ft with the RW. Pt has Bil AFO, her therapist at [facility] thinks she needs a new AFO as her feet still drag when she ambulates, pt reports HHPT mentioned a spring AFO. Pt has custom AFOs that were originally made 4 years ago. Goals: improve balance, to be able to get into the shower with her tub bench without assist, to be able to get up 2 steps at sister's home with railings on both sides but too far apart to reach both sides, to be able to drive adapted car. Pt is staying at other sister's house with one step to get inside and currently picking up L leg with UEs and then stepping in. Pt steps down leading with LLE. Pt wants to  work on walking side to side as this is challenging and she had not yet progressed to this at rehab. Pt able to get in/out the car with assist only to bring BLEs into the car. Hoping to drive by June or July with adapted car (already has one). Pt needs assist bringing LEs into and out of hospital bed. Pt able to perform sit>stand without assist from [facility] with a pillow on the seat for a boost. Pt denies any falls in the past 6 months. Pt has been increasing her time alone at home up to a few hours at a time to become more independent. Pt has both manual and power WC. Uses power WC at home, uses manual WC out in community. Pt able to self propel manual WC. Pt needs assist putting pants around ankles but she can pull them up. Pt can put on her shoes  but needs help getting her leg up to do so. Pt able to tie her shoelaces on her own. Pt has a tub/shower unit at home with a tub bench. No grab bars in the shower. Has hand held shower head. Pt currently taking a sponge bath. Pt has a regular height toilet with a BSC over top. Pt with h/o L wrist fx with no precautions, pt wore her soft brace to evaluation for comfort but reports her wrist has healed and is doing well following OT.     Limitations  Lifting;Standing;Walking;House hold activities    How long can you sit comfortably?  n/a    How long can you stand comfortably?  3 minutes without UE assist, limited by fatigue    How long can you walk comfortably?  100 ft, limited by fatigue    Patient Stated Goals  see above    Currently in Pain?  No/denies      TREATMENT    Ambulation in // bars 10' x10 lengths, theraband utilized on BLE to assist DF, knee flexion, and hip flexion. Cues to avoid trunk flexion and to initiate step by moving pelvis forward.   Tic tac toe balance with SUE support    Stepping forward over tape on floor with emphasis on floor clearance 1x10 each LE with BUE support   Side tapping over tape on floor with emphasis on floor clearance 1x10 with each LE and BUE support    Standing hip abduction x 10 bilateral     Sit to stand from elevated seat with UE assist on // bars x5    Pt wheeled to parking lot and assisted with car transfer. Cues provided for proper sequencing                            PT Education - 08/21/17 1056    Education provided  Yes    Education Details  exercise technique     Person(s) Educated  Patient    Methods  Explanation;Demonstration;Verbal cues    Comprehension  Verbalized understanding;Returned demonstration       PT Short Term Goals - 07/27/17 1153      PT SHORT TERM GOAL #1   Title  Pt will be completed HEP with min assist from caregiver at least 4 days/wk for improved carryover between sessions    Time   2    Period  Weeks    Status  New        PT Long Term Goals - 07/27/17 1153      PT  LONG TERM GOAL #1   Title  Pt's 5xSTS will impove to equal to or below 2 minutes to demonstrate improved balance and BLE strength    Baseline  3.02 minutes    Time  6    Period  Weeks    Status  New      PT LONG TERM GOAL #2   Title  Pt's will improve to at least 0.5 m/s for improved safety ambulating in home    Baseline  0.14 m/s    Time  8    Period  Weeks    Status  New      PT LONG TERM GOAL #3   Title  Pt's Bil hip F strength will improve to at least 2+/5 BLEs for improved gait mechanics and safety    Baseline  L: 2/5, R: 1/5    Time  8    Period  Weeks    Status  New      PT LONG TERM GOAL #4   Title  Pt's ABC Scale will improve to at least 40% to demonstrate improved balance    Baseline  28.13%      PT LONG TERM GOAL #5   Title  Pt will be able to perform car transfer without assist to work towards pt's goal of returning to driving    Baseline  Requires assist to bring BLEs into/out of car    Time  6    Period  Weeks    Status  New      Additional Long Term Goals   Additional Long Term Goals  Yes      PT LONG TERM GOAL #6   Title  Pt will be able to perform tub bench into/out of shower transfer without assist for improved independence with this task    Baseline  Taking sponge bath as she requires assist for transfer    Time  6    Period  Weeks    Status  New            Plan - 08/21/17 1215    Clinical Impression Statement  Patient demonstrates improved ambulatory mechanics with utilization of theraband assist for Bil hip F knee F and DF. Patient requires seated rest breaks due to fatigue. Requests instruction on stair negotiation in future sessions to be able to partake in activities at sisters home. Patient will continue to benefit from skilled physical therapy for improved gait mechanics, strength, and independence.     Rehab Potential  Good    PT Frequency   2x / week    PT Duration  8 weeks    PT Treatment/Interventions  ADLs/Self Care Home Management;Aquatic Therapy;Biofeedback;Cryotherapy;Electrical Stimulation;Iontophoresis 4mg /ml Dexamethasone;Traction;Moist Heat;Ultrasound;DME Instruction;Gait training;Stair training;Functional mobility training;Therapeutic activities;Therapeutic exercise;Balance training;Neuromuscular re-education;Patient/family education;Orthotic Fit/Training;Wheelchair mobility training;Manual techniques;Compression bandaging;Passive range of motion;Dry needling;Energy conservation;Taping;Splinting    PT Next Visit Plan  assess need for new AFOs or other orthotic to assist with ambulation, initiate strengthening program, practice and improve technique for bed mobility and sit<>stand    PT Home Exercise Plan  to initiate next session    Consulted and Agree with Plan of Care  Patient       Patient will benefit from skilled therapeutic intervention in order to improve the following deficits and impairments:  Abnormal gait, Decreased activity tolerance, Decreased balance, Decreased coordination, Decreased endurance, Decreased knowledge of use of DME, Decreased mobility, Decreased range of motion, Decreased safety awareness, Decreased strength, Difficulty walking, Impaired perceived functional ability,  Impaired sensation, Impaired tone, Impaired UE functional use, Improper body mechanics, Postural dysfunction  Visit Diagnosis: Muscle weakness (generalized)  Unsteadiness on feet  Other abnormalities of gait and mobility     Problem List Patient Active Problem List   Diagnosis Date Noted  . Wrist fracture 01/16/2017  . Health care maintenance 01/24/2016  . Blood pressure elevated without history of HTN 10/25/2015  . Multiple sclerosis (HCC) 10/02/2015  . Chronic left shoulder pain 07/19/2015  . Multiple sclerosis exacerbation (HCC) 07/14/2015  . MS (multiple sclerosis) (HCC) 11/26/2014  . Increased body mass index  11/26/2014  . HPV test positive 11/26/2014  . Status post laparoscopic supracervical hysterectomy 11/26/2014  . Galactorrhea 11/26/2014  . Tobacco user 11/26/2014  . Back ache 05/21/2014  . Adiposity 05/21/2014  . Disordered sleep 05/21/2014  . Muscle spasticity 05/21/2014  . Calculus of kidney 12/09/2013  . Renal colic 12/09/2013  . Hypercholesteremia 08/19/2013  . Hereditary and idiopathic neuropathy 08/19/2013  . Hypercholesterolemia without hypertriglyceridemia 08/19/2013  . Bladder infection, chronic 07/25/2012  . Disorder of bladder function 07/25/2012  . Incomplete bladder emptying 07/25/2012  . Microscopic hematuria 07/25/2012  Precious Bard, PT, DPT   08/21/2017, 12:15 PM  [place] [facility] MAIN [facility] SERVICES [address] [place], [place], [address] Phone: [phone]   Fax:  [phone]  Name: Lisa Williams MRN: [id number] Date of Birth: Mar 26, 1961

## 2017-08-24 ENCOUNTER — Ambulatory Visit: Payer: PPO

## 2017-08-27 DIAGNOSIS — G35 Multiple sclerosis: Secondary | ICD-10-CM | POA: Diagnosis not present

## 2017-08-27 DIAGNOSIS — M4722 Other spondylosis with radiculopathy, cervical region: Secondary | ICD-10-CM | POA: Diagnosis not present

## 2017-08-28 ENCOUNTER — Ambulatory Visit: Payer: PPO

## 2017-08-28 DIAGNOSIS — M6281 Muscle weakness (generalized): Secondary | ICD-10-CM

## 2017-08-28 DIAGNOSIS — R2689 Other abnormalities of gait and mobility: Secondary | ICD-10-CM

## 2017-08-28 DIAGNOSIS — R2681 Unsteadiness on feet: Secondary | ICD-10-CM

## 2017-08-28 NOTE — Therapy (Signed)
[facility] MAIN [facility] SERVICES [address] [place], [place], [address] Phone: [phone]   Fax:  [phone]  Physical Therapy Treatment  Patient Details  Name: Lisa Williams MRN: [id number] Date of Birth: 04/15/1961 Referring Provider: Theora Master, MD   Encounter Date: 08/28/2017  PT End of Session - 08/28/17 1258    Visit Number  8    Number of Visits  17    Date for PT Re-Evaluation  09/21/17    PT Start Time  1113    PT Stop Time  1200    PT Time Calculation (min)  47 min    Equipment Utilized During Treatment  Gait belt    Activity Tolerance  Patient tolerated treatment well;Patient limited by fatigue    Behavior During Therapy  [facility] for tasks assessed/performed       Past Medical History:  Diagnosis Date  . Abdominal pain, right upper quadrant   . Back pain   . Calculus of kidney 12/09/2013  . Chronic back pain    unspecified  . Chronic left shoulder pain 07/19/2015  . Functional disorder of bladder    other  . Galactorrhea 11/26/2014   Chronic   . Hereditary and idiopathic neuropathy 08/19/2013  . HPV test positive   . Hypercholesteremia 08/19/2013  . Incomplete bladder emptying   . Microscopic hematuria   . MS (multiple sclerosis) (HCC)   . Muscle spasticity 05/21/2014  . Nonspecific findings on examination of urine    other  . Osteopenia   . Status post laparoscopic supracervical hysterectomy 11/26/2014  . Tobacco user 11/26/2014  . Wrist fracture     Past Surgical History:  Procedure Laterality Date  . bilateral tubal ligation  1996  . BREAST CYST EXCISION Left 2002  . KNEE SURGERY     right  . LAPAROSCOPIC SUPRACERVICAL HYSTERECTOMY  08/05/2013  . ORIF WRIST FRACTURE Left 01/17/2017   Procedure: OPEN REDUCTION INTERNAL FIXATION (ORIF) WRIST FRACTURE;  Surgeon: Lyndle Herrlich, MD;  Location: [facility] ORS;  Service: Orthopedics;  Laterality: Left;  . TUBAL LIGATION Bilateral   . VAGINAL HYSTERECTOMY   03/2006    There were no vitals filed for this visit.  Subjective Assessment - 08/28/17 1254    Subjective  Patient is able to stand in shower to bath now with mod I and supervision by mom. Hurt her neck/shoulder last week and will be coming for therapy for it at a later date.     Pertinent History  Pt is a 56 y/o F who presents with BLE weakness and difficulty ambulating due to h/o MS that was diagnosed in 1995. Pt denies any pain associated. Pt was admitted to the hospital in November 2018 due to MS exacerbation. Nov-Dec to HHPT-->[facility] until end of Jan. After rehab the pt was able to ambulate up to 100 ft with the RW. Pt has Bil AFO, her therapist at HHPT thinks she needs a new AFO as her feet still drag when she ambulates, pt reports HHPT mentioned a spring AFO. Pt has custom AFOs that were originally made 4 years ago. Goals: improve balance, to be able to get into the shower with her tub bench without assist, to be able to get up 2 steps at sister's home with railings on both sides but too far apart to reach both sides, to be able to drive adapted car. Pt is staying at other sister's house with one step to get inside and currently picking up  L leg with UEs and then stepping in. Pt steps down leading with LLE. Pt wants to work on walking side to side as this is challenging and she had not yet progressed to this at rehab. Pt able to get in/out the car with assist only to bring BLEs into the car. Hoping to drive by June or July with adapted car (already has one). Pt needs assist bringing LEs into and out of hospital bed. Pt able to perform sit>stand without assist from [facility] with a pillow on the seat for a boost. Pt denies any falls in the past 6 months. Pt has been increasing her time alone at home up to a few hours at a time to become more independent. Pt has both manual and power WC. Uses power WC at home, uses manual WC out in community. Pt able to self propel manual WC. Pt needs assist  putting pants around ankles but she can pull them up. Pt can put on her shoes but needs help getting her leg up to do so. Pt able to tie her shoelaces on her own. Pt has a tub/shower unit at home with a tub bench. No grab bars in the shower. Has hand held shower head. Pt currently taking a sponge bath. Pt has a regular height toilet with a BSC over top. Pt with h/o L wrist fx with no precautions, pt wore her soft brace to evaluation for comfort but reports her wrist has healed and is doing well following OT.     Limitations  Lifting;Standing;Walking;House hold activities    How long can you sit comfortably?  n/a    How long can you stand comfortably?  3 minutes without UE assist, limited by fatigue    How long can you walk comfortably?  100 ft, limited by fatigue    Patient Stated Goals  see above    Currently in Pain?  Yes    Pain Score  4     Pain Location  Shoulder    Pain Orientation  Right    Pain Descriptors / Indicators  Shooting    Pain Type  Acute pain    Pain Onset  1 to 4 weeks ago    Pain Frequency  Intermittent          TREATMENT       Stepping forward over tape on floor with emphasis on floor clearance 1x10 each LE with BUE support    Side tapping over tape on floor with emphasis on floor clearance 1x10 with each LE and BUE support    Standing hip abduction x 10 bilateral : cues for slowing down resulting in improved  Clearance.     2" toe tap practice two rails 15x BLE with cues for pausing taking deep breath and slowing down attempts with good correction upon slowing down   Sit to stand from elevated seat with UE assist on // bars x5    Pt wheeled to parking lot and assisted with car transfer. Cues provided for proper sequencing                       PT Education - 08/28/17 1255    Education provided  Yes    Education Details  exercise technique, car transfer    Person(s) Educated  Patient    Methods  Explanation;Demonstration;Verbal cues     Comprehension  Verbalized understanding;Returned demonstration       PT Short Term Goals - 07/27/17 1153  PT SHORT TERM GOAL #1   Title  Pt will be completed HEP with min assist from caregiver at least 4 days/wk for improved carryover between sessions    Time  2    Period  Weeks    Status  New        PT Long Term Goals - 07/27/17 1153      PT LONG TERM GOAL #1   Title  Pt's 5xSTS will impove to equal to or below 2 minutes to demonstrate improved balance and BLE strength    Baseline  3.02 minutes    Time  6    Period  Weeks    Status  New      PT LONG TERM GOAL #2   Title  Pt's will improve to at least 0.5 m/s for improved safety ambulating in home    Baseline  0.14 m/s    Time  8    Period  Weeks    Status  New      PT LONG TERM GOAL #3   Title  Pt's Bil hip F strength will improve to at least 2+/5 BLEs for improved gait mechanics and safety    Baseline  L: 2/5, R: 1/5    Time  8    Period  Weeks    Status  New      PT LONG TERM GOAL #4   Title  Pt's ABC Scale will improve to at least 40% to demonstrate improved balance    Baseline  28.13%      PT LONG TERM GOAL #5   Title  Pt will be able to perform car transfer without assist to work towards pt's goal of returning to driving    Baseline  Requires assist to bring BLEs into/out of car    Time  6    Period  Weeks    Status  New      Additional Long Term Goals   Additional Long Term Goals  Yes      PT LONG TERM GOAL #6   Title  Pt will be able to perform tub bench into/out of shower transfer without assist for improved independence with this task    Baseline  Taking sponge bath as she requires assist for transfer    Time  6    Period  Weeks    Status  New            Plan - 08/28/17 1259    Clinical Impression Statement  Patient demonstrated improved clearance of BLE with verbal cueing for exhaling upon concentric motion and slowing down motions for improved control. Patient demonstrating  improved clearance with ability to touch top of 2" step with RLE multiple times. Patient will continue to benefit from skilled physical therapy for improved gait mechanics, strength, and independence.     Rehab Potential  Good    PT Frequency  2x / week    PT Duration  8 weeks    PT Treatment/Interventions  ADLs/Self Care Home Management;Aquatic Therapy;Biofeedback;Cryotherapy;Electrical Stimulation;Iontophoresis 4mg /ml Dexamethasone;Traction;Moist Heat;Ultrasound;DME Instruction;Gait training;Stair training;Functional mobility training;Therapeutic activities;Therapeutic exercise;Balance training;Neuromuscular re-education;Patient/family education;Orthotic Fit/Training;Wheelchair mobility training;Manual techniques;Compression bandaging;Passive range of motion;Dry needling;Energy conservation;Taping;Splinting    PT Next Visit Plan  assess need for new AFOs or other orthotic to assist with ambulation, initiate strengthening program, practice and improve technique for bed mobility and sit<>stand    PT Home Exercise Plan  to initiate next session    Consulted and Agree with Plan of Care  Patient       Patient will benefit from skilled therapeutic intervention in order to improve the following deficits and impairments:  Abnormal gait, Decreased activity tolerance, Decreased balance, Decreased coordination, Decreased endurance, Decreased knowledge of use of DME, Decreased mobility, Decreased range of motion, Decreased safety awareness, Decreased strength, Difficulty walking, Impaired perceived functional ability, Impaired sensation, Impaired tone, Impaired UE functional use, Improper body mechanics, Postural dysfunction  Visit Diagnosis: Muscle weakness (generalized)  Unsteadiness on feet  Other abnormalities of gait and mobility     Problem List Patient Active Problem List   Diagnosis Date Noted  . Wrist fracture 01/16/2017  . Health care maintenance 01/24/2016  . Blood pressure elevated  without history of HTN 10/25/2015  . Multiple sclerosis (HCC) 10/02/2015  . Chronic left shoulder pain 07/19/2015  . Multiple sclerosis exacerbation (HCC) 07/14/2015  . MS (multiple sclerosis) (HCC) 11/26/2014  . Increased body mass index 11/26/2014  . HPV test positive 11/26/2014  . Status post laparoscopic supracervical hysterectomy 11/26/2014  . Galactorrhea 11/26/2014  . Tobacco user 11/26/2014  . Back ache 05/21/2014  . Adiposity 05/21/2014  . Disordered sleep 05/21/2014  . Muscle spasticity 05/21/2014  . Calculus of kidney 12/09/2013  . Renal colic 12/09/2013  . Hypercholesteremia 08/19/2013  . Hereditary and idiopathic neuropathy 08/19/2013  . Hypercholesterolemia without hypertriglyceridemia 08/19/2013  . Bladder infection, chronic 07/25/2012  . Disorder of bladder function 07/25/2012  . Incomplete bladder emptying 07/25/2012  . Microscopic hematuria 07/25/2012   Precious Bard, PT, DPT   08/28/2017, 1:00 PM  [place] MAIN [facility] SERVICES [address] [place], [place], [address] Phone: [phone]   Fax:  [phone]  Name: Lisa Williams MRN: [id number] Date of Birth: Oct 09, 1960

## 2017-08-30 ENCOUNTER — Ambulatory Visit: Payer: PPO

## 2017-08-30 DIAGNOSIS — R2689 Other abnormalities of gait and mobility: Secondary | ICD-10-CM

## 2017-08-30 DIAGNOSIS — R2681 Unsteadiness on feet: Secondary | ICD-10-CM

## 2017-08-30 DIAGNOSIS — M6281 Muscle weakness (generalized): Secondary | ICD-10-CM

## 2017-08-30 NOTE — Therapy (Signed)
[facility] MAIN [facility] SERVICES [address] [place], [place], [address] Phone: [phone]   Fax:  [phone]  Physical Therapy Treatment  Patient Details  Name: Lisa Williams MRN: [id number] Date of Birth: 1960/11/20 Referring Provider: Theora Master, MD   Encounter Date: 08/30/2017  PT End of Session - 08/30/17 1205    Visit Number  9    Number of Visits  17    Date for PT Re-Evaluation  09/21/17    PT Start Time  1030    PT Stop Time  1115    PT Time Calculation (min)  45 min    Equipment Utilized During Treatment  Gait belt    Activity Tolerance  Patient tolerated treatment well;Patient limited by fatigue    Behavior During Therapy  [facility] for tasks assessed/performed       Past Medical History:  Diagnosis Date  . Abdominal pain, right upper quadrant   . Back pain   . Calculus of kidney 12/09/2013  . Chronic back pain    unspecified  . Chronic left shoulder pain 07/19/2015  . Functional disorder of bladder    other  . Galactorrhea 11/26/2014   Chronic   . Hereditary and idiopathic neuropathy 08/19/2013  . HPV test positive   . Hypercholesteremia 08/19/2013  . Incomplete bladder emptying   . Microscopic hematuria   . MS (multiple sclerosis) (HCC)   . Muscle spasticity 05/21/2014  . Nonspecific findings on examination of urine    other  . Osteopenia   . Status post laparoscopic supracervical hysterectomy 11/26/2014  . Tobacco user 11/26/2014  . Wrist fracture     Past Surgical History:  Procedure Laterality Date  . bilateral tubal ligation  1996  . BREAST CYST EXCISION Left 2002  . KNEE SURGERY     right  . LAPAROSCOPIC SUPRACERVICAL HYSTERECTOMY  08/05/2013  . ORIF WRIST FRACTURE Left 01/17/2017   Procedure: OPEN REDUCTION INTERNAL FIXATION (ORIF) WRIST FRACTURE;  Surgeon: Lyndle Herrlich, MD;  Location: [facility] ORS;  Service: Orthopedics;  Laterality: Left;  . TUBAL LIGATION Bilateral   . VAGINAL HYSTERECTOMY   03/2006    There were no vitals filed for this visit.  Subjective Assessment - 08/30/17 1058    Subjective  Patient continues to have neck pain. Does not have order from doctor yet but will call again today. Compliant with HEP at home.     Pertinent History  Pt is a 57 y/o F who presents with BLE weakness and difficulty ambulating due to h/o MS that was diagnosed in 1995. Pt denies any pain associated. Pt was admitted to the hospital in November 2018 due to MS exacerbation. Nov-Dec to HHPT-->[facility] until end of Jan. After rehab the pt was able to ambulate up to 100 ft with the RW. Pt has Bil AFO, her therapist at [facility]PT thinks she needs a new AFO as her feet still drag when she ambulates, pt reports HHPT mentioned a spring AFO. Pt has custom AFOs that were originally made 4 years ago. Goals: improve balance, to be able to get into the shower with her tub bench without assist, to be able to get up 2 steps at sister's home with railings on both sides but too far apart to reach both sides, to be able to drive adapted car. Pt is staying at other sister's house with one step to get inside and currently picking up L leg with UEs and then stepping in. Pt steps down  leading with LLE. Pt wants to work on walking side to side as this is challenging and she had not yet progressed to this at rehab. Pt able to get in/out the car with assist only to bring BLEs into the car. Hoping to drive by June or July with adapted car (already has one). Pt needs assist bringing LEs into and out of hospital bed. Pt able to perform sit>stand without assist from [facility] with a pillow on the seat for a boost. Pt denies any falls in the past 6 months. Pt has been increasing her time alone at home up to a few hours at a time to become more independent. Pt has both manual and power WC. Uses power WC at home, uses manual WC out in community. Pt able to self propel manual WC. Pt needs assist putting pants around ankles but she can pull  them up. Pt can put on her shoes but needs help getting her leg up to do so. Pt able to tie her shoelaces on her own. Pt has a tub/shower unit at home with a tub bench. No grab bars in the shower. Has hand held shower head. Pt currently taking a sponge bath. Pt has a regular height toilet with a BSC over top. Pt with h/o L wrist fx with no precautions, pt wore her soft brace to evaluation for comfort but reports her wrist has healed and is doing well following OT.     Limitations  Lifting;Standing;Walking;House hold activities    How long can you sit comfortably?  n/a    How long can you stand comfortably?  3 minutes without UE assist, limited by fatigue    How long can you walk comfortably?  100 ft, limited by fatigue    Patient Stated Goals  see above    Currently in Pain?  Yes    Pain Score  7     Pain Location  Shoulder    Pain Orientation  Left    Pain Descriptors / Indicators  Shooting    Pain Type  Acute pain    Pain Onset  1 to 4 weeks ago    Pain Frequency  Intermittent     Ambulate 96 ft with RW and bilateral BTB Hip F with CGA and WC follow   Stepping forward over tape on floor with emphasis on floor clearance 1x10 each LE with BUE support ; improved clearance of LLE  Side tapping over tape on floor with emphasis on floor clearance 1x10 with each LE and BUE support   Standing hip abduction x 2 lengths of // bars   Mini squats 10x : cues for pushing hips forward when straightening legs, and pushing hips back when bending legs.   Sit to stand from elevated seat with UE assist on // bars x10                          PT Education - 08/30/17 1120    Education provided  Yes    Education Details  exercise technique, transfers    Person(s) Educated  Patient    Methods  Explanation;Demonstration;Verbal cues    Comprehension  Verbalized understanding;Returned demonstration       PT Short Term Goals - 07/27/17 1153      PT SHORT TERM GOAL #1   Title   Pt will be completed HEP with min assist from caregiver at least 4 days/wk for improved carryover between sessions  Time  2    Period  Weeks    Status  New        PT Long Term Goals - 07/27/17 1153      PT LONG TERM GOAL #1   Title  Pt's 5xSTS will impove to equal to or below 2 minutes to demonstrate improved balance and BLE strength    Baseline  3.02 minutes    Time  6    Period  Weeks    Status  New      PT LONG TERM GOAL #2   Title  Pt's will improve to at least 0.5 m/s for improved safety ambulating in home    Baseline  0.14 m/s    Time  8    Period  Weeks    Status  New      PT LONG TERM GOAL #3   Title  Pt's Bil hip F strength will improve to at least 2+/5 BLEs for improved gait mechanics and safety    Baseline  L: 2/5, R: 1/5    Time  8    Period  Weeks    Status  New      PT LONG TERM GOAL #4   Title  Pt's ABC Scale will improve to at least 40% to demonstrate improved balance    Baseline  28.13%      PT LONG TERM GOAL #5   Title  Pt will be able to perform car transfer without assist to work towards pt's goal of returning to driving    Baseline  Requires assist to bring BLEs into/out of car    Time  6    Period  Weeks    Status  New      Additional Long Term Goals   Additional Long Term Goals  Yes      PT LONG TERM GOAL #6   Title  Pt will be able to perform tub bench into/out of shower transfer without assist for improved independence with this task    Baseline  Taking sponge bath as she requires assist for transfer    Time  6    Period  Weeks    Status  New            Plan - 08/30/17 1207    Clinical Impression Statement  Patient demonstrates improved ability to clear LLE when standing. Increased velocity of movement with minimal fatigue noted with side stepping and forward ambulation demonstrating improved strength at this time. Patient will continue to benefit from skilled physical therapy for improved gait mechanics, strength, and  independence.     Rehab Potential  Good    PT Frequency  2x / week    PT Duration  8 weeks    PT Treatment/Interventions  ADLs/Self Care Home Management;Aquatic Therapy;Biofeedback;Cryotherapy;Electrical Stimulation;Iontophoresis 4mg /ml Dexamethasone;Traction;Moist Heat;Ultrasound;DME Instruction;Gait training;Stair training;Functional mobility training;Therapeutic activities;Therapeutic exercise;Balance training;Neuromuscular re-education;Patient/family education;Orthotic Fit/Training;Wheelchair mobility training;Manual techniques;Compression bandaging;Passive range of motion;Dry needling;Energy conservation;Taping;Splinting    PT Next Visit Plan  assess need for new AFOs or other orthotic to assist with ambulation, initiate strengthening program, practice and improve technique for bed mobility and sit<>stand    PT Home Exercise Plan  to initiate next session    Consulted and Agree with Plan of Care  Patient       Patient will benefit from skilled therapeutic intervention in order to improve the following deficits and impairments:  Abnormal gait, Decreased activity tolerance, Decreased balance, Decreased coordination, Decreased endurance, Decreased knowledge of  use of DME, Decreased mobility, Decreased range of motion, Decreased safety awareness, Decreased strength, Difficulty walking, Impaired perceived functional ability, Impaired sensation, Impaired tone, Impaired UE functional use, Improper body mechanics, Postural dysfunction  Visit Diagnosis: Muscle weakness (generalized)  Unsteadiness on feet  Other abnormalities of gait and mobility     Problem List Patient Active Problem List   Diagnosis Date Noted  . Wrist fracture 01/16/2017  . Health care maintenance 01/24/2016  . Blood pressure elevated without history of HTN 10/25/2015  . Multiple sclerosis (HCC) 10/02/2015  . Chronic left shoulder pain 07/19/2015  . Multiple sclerosis exacerbation (HCC) 07/14/2015  . MS (multiple  sclerosis) (HCC) 11/26/2014  . Increased body mass index 11/26/2014  . HPV test positive 11/26/2014  . Status post laparoscopic supracervical hysterectomy 11/26/2014  . Galactorrhea 11/26/2014  . Tobacco user 11/26/2014  . Back ache 05/21/2014  . Adiposity 05/21/2014  . Disordered sleep 05/21/2014  . Muscle spasticity 05/21/2014  . Calculus of kidney 12/09/2013  . Renal colic 12/09/2013  . Hypercholesteremia 08/19/2013  . Hereditary and idiopathic neuropathy 08/19/2013  . Hypercholesterolemia without hypertriglyceridemia 08/19/2013  . Bladder infection, chronic 07/25/2012  . Disorder of bladder function 07/25/2012  . Incomplete bladder emptying 07/25/2012  . Microscopic hematuria 07/25/2012   Precious Bard, PT, DPT   08/30/2017, 12:08 PM  [place] [facility] [place] MAIN [facility] SERVICES [address] [place], [place], [address] Phone: [phone]   Fax:  [phone]  Name: Lisa Williams MRN: [id number] Date of Birth: 1960/07/16

## 2017-09-04 ENCOUNTER — Ambulatory Visit: Payer: PPO

## 2017-09-04 DIAGNOSIS — M6281 Muscle weakness (generalized): Secondary | ICD-10-CM | POA: Diagnosis not present

## 2017-09-04 DIAGNOSIS — M5412 Radiculopathy, cervical region: Secondary | ICD-10-CM

## 2017-09-04 NOTE — Therapy (Signed)
[facility] MAIN [facility] SERVICES [address] [place], [place], [address] Phone: [phone]   Fax:  [phone]  Physical Therapy Evaluation  Patient Details  Name: Lisa Williams MRN: [id number] Date of Birth: May 27, 1960 Referring Provider: Theora Master, MD   Encounter Date: 09/04/2017  PT End of Session - 09/04/17 1319    Visit Number  1    Number of Visits  8    Date for PT Re-Evaluation  10/02/17    PT Start Time  1115    PT Stop Time  1206    PT Time Calculation (min)  51 min    Equipment Utilized During Treatment  Gait belt    Activity Tolerance  Patient limited by pain    Behavior During Therapy  [facility] for tasks assessed/performed       Past Medical History:  Diagnosis Date  . Abdominal pain, right upper quadrant   . Back pain   . Calculus of kidney 12/09/2013  . Chronic back pain    unspecified  . Chronic left shoulder pain 07/19/2015  . Functional disorder of bladder    other  . Galactorrhea 11/26/2014   Chronic   . Hereditary and idiopathic neuropathy 08/19/2013  . HPV test positive   . Hypercholesteremia 08/19/2013  . Incomplete bladder emptying   . Microscopic hematuria   . MS (multiple sclerosis) (HCC)   . Muscle spasticity 05/21/2014  . Nonspecific findings on examination of urine    other  . Osteopenia   . Status post laparoscopic supracervical hysterectomy 11/26/2014  . Tobacco user 11/26/2014  . Wrist fracture     Past Surgical History:  Procedure Laterality Date  . bilateral tubal ligation  1996  . BREAST CYST EXCISION Left 2002  . KNEE SURGERY     right  . LAPAROSCOPIC SUPRACERVICAL HYSTERECTOMY  08/05/2013  . ORIF WRIST FRACTURE Left 01/17/2017   Procedure: OPEN REDUCTION INTERNAL FIXATION (ORIF) WRIST FRACTURE;  Surgeon: Lyndle Herrlich, MD;  Location: [facility] ORS;  Service: Orthopedics;  Laterality: Left;  . TUBAL LIGATION Bilateral   . VAGINAL HYSTERECTOMY  03/2006    There were no vitals filed  for this visit.   Subjective Assessment - 09/04/17 1122    Subjective  Patient is currently being seen by this therapist for MS. Has had recent onset of cervical radiculopathy. Sharp pain in shoulder pain to elbow in L side on posterior side. Started April 17th, 2019 when she woke up. Went to the doctor 4/20 and reports it was a pinched nerve from her cervical region. Gave her exercises for ROM Referral for PT given.    Pertinent History  Pt is a 58 y/o F who presents with BLE weakness and difficulty ambulating due to h/o MS that was diagnosed in 1995. Pt denies any pain associated. Pt was admitted to the hospital in November 2018 due to MS exacerbation. Nov-Dec to [facility] until end of Jan. After rehab the pt was able to ambulate up to 100 ft with the RW. Pt has Bil AFO, her therapist at [facility] thinks she needs a new AFO as her feet still drag when she ambulates, pt reports HHPT mentioned a spring AFO. Pt has custom AFOs that were originally made 4 years ago. Goals: improve balance, to be able to get into the shower with her tub bench without assist, to be able to get up 2 steps at sister's home with railings on both sides but too far apart to reach  both sides, to be able to drive adapted car. Pt is staying at other sister's house with one step to get inside and currently picking up L leg with UEs and then stepping in. Pt steps down leading with LLE. Pt wants to work on walking side to side as this is challenging and she had not yet progressed to this at rehab. Pt able to get in/out the car with assist only to bring BLEs into the car. Hoping to drive by June or July with adapted car (already has one). Pt needs assist bringing LEs into and out of hospital bed. Pt able to perform sit>stand without assist from [facility] with a pillow on the seat for a boost. Pt denies any falls in the past 6 months. Pt has been increasing her time alone at home up to a few hours at a time to become more independent. Pt  has both manual and power WC. Uses power WC at home, uses manual WC out in community. Pt able to self propel manual WC. Pt needs assist putting pants around ankles but she can pull them up. Pt can put on her shoes but needs help getting her leg up to do so. Pt able to tie her shoelaces on her own. Pt has a tub/shower unit at home with a tub bench. No grab bars in the shower. Has hand held shower head. Pt currently taking a sponge bath. Pt has a regular height toilet with a BSC over top. Pt with h/o L wrist fx with no precautions, pt wore her soft brace to evaluation for comfort but reports her wrist has healed and is doing well following OT.     Limitations  Lifting;Standing;Walking;House hold activities    How long can you sit comfortably?  n/a    How long can you stand comfortably?  3 minutes without UE assist, limited by fatigue    How long can you walk comfortably?  100 ft, limited by fatigue    Patient Stated Goals  see above    Currently in Pain?  Yes    Pain Score  9     Pain Location  Shoulder from neck to elbow    Pain Orientation  Left    Pain Descriptors / Indicators  Shooting;Aching    Pain Type  Acute pain    Pain Onset  In the past 7 days    Pain Frequency  Intermittent    Aggravating Factors   weight bear through L arm     Pain Relieving Factors  ice pack or laying down, prop arm up     Effect of Pain on Daily Activities  limits walking       Pain Worst pain: 9/10;  laying down or putting ice pack on reduces pain Current pain: 9/10  Best pain: 4/10      ROM  Right Left  Flexion 35 pain  Extension 34 pain  Side Bending 26 no pain 35 pain  Rotation 50 34     [facility] PT Assessment - 09/04/17 0001      Assessment   Medical Diagnosis  MS/ Cervical Radiculopathy    Referring Provider  Theora Master, MD    Onset Date/Surgical Date  08/22/17    Hand Dominance  Right    Prior Therapy  Yes      Precautions   Precautions  Fall      Restrictions   Weight Bearing  Restrictions  No      Balance Screen  Has the patient fallen in the past 6 months  No    Has the patient had a decrease in activity level because of a fear of falling?   Yes    Is the patient reluctant to leave their home because of a fear of falling?   No      Home Environment   Living Environment  Private residence    Living Arrangements  Other relatives    Available Help at Discharge  Family;Available PRN/intermittently    Type of Home  House    Home Access  Stairs to enter    Entrance Stairs-Number of Steps  1    Entrance Stairs-Rails  None    Home Layout  One level    Home Equipment  Walker - 2 wheels;Wheelchair - Engineer, technical sales - power;Hospital bed;Tub bench;Bedside commode;Hand held shower head      Prior Function   Level of Independence  Needs assistance with ADLs;Needs assistance with homemaking;Needs assistance with gait    Leisure  shopping (although hasn't been able to do this for the past 6 months), traveling      Cognition   Overall Cognitive Status  Within Functional Limits for tasks assessed      Observation/Other Assessments   Observations  holding L elbow in hand      Sensation   Light Touch  Appears Intact      Coordination   Gross Motor Movements are Fluid and Coordinated  No    Fine Motor Movements are Fluid and Coordinated  No    Coordination and Movement Description  painful L shoulder movements limiting coordination       Posture/Postural Control   Posture/Postural Control  Postural limitations    Postural Limitations  -- L shoulder drop      ROM / Strength   AROM / PROM / Strength  --     UE   Seated AROM Right Left  Shoulder Flexion 126 120 pain  Shoulder Abduction 132 118 with shoulder hike  ER  10  IR  41   Strength   Right Left  Upper trap 4/5 3/5 pain  Biceps 4/5 3/5 pain  Triceps 4/5 3/5 pain  Wrist Ext    Wrist Flexion    Finger Abd    Finger Add     Special Tests/Nerve involvement Radial nerve entrapment (+) nerve  test -Median nerve entrapment test Suboccipital release reduced pain NDI : 56% Muscle Tissue Length Suboccipital muscles, posterior cervical muscles, and upper thoracic musculature excessively tight with pain guarding response resulting in pain avoidant mindset.    Unable to mobilize cervical spine due to excessive guarding/pain.   Access Code: [email or web address]  URL: [email or web address]  Date: 09/04/2017  Prepared by: Precious Bard   Exercises  Upper Cervical Extension SNAG with Strap - 10 reps - 2 sets - 5 hold - 1x daily - 7x weekly  Cervical Extension and Sidebending AROM with Strap - 10 reps - 2 sets - 10 hold - 1x daily - 7x weekly  Standing Radial Nerve Glide - 10 reps - 2 sets - 10 hold - 1x daily - 7x weekly             Objective measurements completed on examination: See above findings.              PT Education - 09/04/17 1319    Education provided  Yes    Education Details  HEP, POC,     Person(s) Educated  Patient  Methods  Explanation;Demonstration;Verbal cues    Comprehension  Verbalized understanding;Returned demonstration;Need further instruction       PT Short Term Goals - 09/04/17 1323      PT SHORT TERM GOAL #1   Title  Pt will be completed HEP with min assist from caregiver at least 4 days/wk for improved carryover between sessions    Time  2    Period  Weeks    Status  New    Target Date  09/18/17      PT SHORT TERM GOAL #2   Title  Patient will report a worst pain of 6/10 on VAS in cervical spine to improve tolerance with ADLs and reduced symptoms with activities.     Baseline  4/30: 9/10     Time  2    Period  Weeks    Status  New    Target Date  09/18/17      PT SHORT TERM GOAL #3   Title  Patient will perform cervical rotation at least 50 degrees, flexion and extension to 70 degrees, and sidebending to 40 degrees without pain    Baseline  4/30: R 50 L 34 Rotation, R 26 L 35 sb, Extension 35 flex 35     Time  2     Period  Weeks    Status  New    Target Date  09/18/17        PT Long Term Goals - 09/04/17 1326      PT LONG TERM GOAL #1   Title  Pt's 5xSTS will impove to equal to or below 2 minutes to demonstrate improved balance and BLE strength    Baseline  3.02 minutes    Time  6    Period  Weeks    Status  New      PT LONG TERM GOAL #2   Title  Pt's will improve to at least 0.5 m/s for improved safety ambulating in home    Baseline  0.14 m/s    Time  8    Period  Weeks    Status  New      PT LONG TERM GOAL #3   Title  Pt's Bil hip F strength will improve to at least 2+/5 BLEs for improved gait mechanics and safety    Baseline  L: 2/5, R: 1/5    Time  8    Period  Weeks    Status  New      PT LONG TERM GOAL #4   Title  Pt's ABC Scale will improve to at least 40% to demonstrate improved balance    Baseline  28.13%      PT LONG TERM GOAL #5   Title  Pt will be able to perform car transfer without assist to work towards pt's goal of returning to driving    Baseline  Requires assist to bring BLEs into/out of car    Time  6    Period  Weeks    Status  New      Additional Long Term Goals   Additional Long Term Goals  Yes      PT LONG TERM GOAL #6   Title  Pt will be able to perform tub bench into/out of shower transfer without assist for improved independence with this task    Baseline  Taking sponge bath as she requires assist for transfer    Time  6    Period  Weeks  Status  New      PT LONG TERM GOAL #7   Title  Patient will perform cervical rotation at least 60 degrees, flexion and extension to 70 degrees, and sidebending to 40 degrees without pain    Baseline  4/30: R 50 L 34 Rotation, R 26 L 35 sb, Extension 35 flex 35     Time  4    Period  Weeks    Status  New    Target Date  10/02/17      PT LONG TERM GOAL #8   Title  Patient will improve NDI to <28% to reduce disability to mild  percieved disability for improved quality of life.     Baseline  4/30: 56%  severe disability     Time  4    Period  Weeks    Status  New    Target Date  10/02/17      PT LONG TERM GOAL  #9   TITLE  Patient will report a worst pain of 3/10 on VAS in  cervical spine  to improve tolerance with ADLs and reduced symptoms with activities.     Baseline  4/30: 9/10     Time  4    Period  Weeks    Status  New    Target Date  10/02/17             Plan - 09/04/17 1320    Clinical Impression Statement  Patient is a pleasant 57 year old female who is currently seeing this therapist for MS, and presents with new onset of cervical radiculopathy. Patient's cervical pain radiating to elbow limits her ability to transfer and ambulate due to need for excessive UE support for MS diagnosis. Unable to mobilize cervical spine to effectively assess level of entrapment due to excessive muscle guarding. NDI 56%. Cervical ROM limited and painful as well as L shoulder ROM. Potential involvement of SCM also noted due to restriction pattern of muscle length. Patient will benefit from skilled physical therapy to address cervical pain and limited ROM in addition to continuing current POC for MS.    History and Personal Factors relevant to plan of care:  (+) motivation, family support (-) Dx of MS    Clinical Presentation  Evolving    Clinical Presentation due to:  Concurrent MS, recent onset of cervical radiculopathy    Clinical Decision Making  Moderate    Rehab Potential  Good    PT Frequency  2x / week    PT Duration  4 weeks    PT Treatment/Interventions  ADLs/Self Care Home Management;Aquatic Therapy;Biofeedback;Cryotherapy;Electrical Stimulation;Iontophoresis 4mg /ml Dexamethasone;Traction;Moist Heat;Ultrasound;DME Instruction;Gait training;Stair training;Functional mobility training;Therapeutic activities;Therapeutic exercise;Balance training;Neuromuscular re-education;Patient/family education;Orthotic Fit/Training;Wheelchair mobility training;Manual techniques;Compression  bandaging;Passive range of motion;Dry needling;Energy conservation;Taping;Splinting    PT Next Visit Plan  suboccipital release, release cervical musculature    PT Home Exercise Plan  see sheet    Consulted and Agree with Plan of Care  Patient       Patient will benefit from skilled therapeutic intervention in order to improve the following deficits and impairments:  Abnormal gait, Decreased activity tolerance, Decreased balance, Decreased coordination, Decreased endurance, Decreased knowledge of use of DME, Decreased mobility, Decreased range of motion, Decreased safety awareness, Decreased strength, Difficulty walking, Impaired perceived functional ability, Impaired sensation, Impaired tone, Impaired UE functional use, Improper body mechanics, Postural dysfunction, Hypomobility, Impaired flexibility, Pain  Visit Diagnosis: Radiculopathy, cervical region  Muscle weakness (generalized)     Problem List Patient  Active Problem List   Diagnosis Date Noted  . Wrist fracture 01/16/2017  . Health care maintenance 01/24/2016  . Blood pressure elevated without history of HTN 10/25/2015  . Multiple sclerosis (HCC) 10/02/2015  . Chronic left shoulder pain 07/19/2015  . Multiple sclerosis exacerbation (HCC) 07/14/2015  . MS (multiple sclerosis) (HCC) 11/26/2014  . Increased body mass index 11/26/2014  . HPV test positive 11/26/2014  . Status post laparoscopic supracervical hysterectomy 11/26/2014  . Galactorrhea 11/26/2014  . Tobacco user 11/26/2014  . Back ache 05/21/2014  . Adiposity 05/21/2014  . Disordered sleep 05/21/2014  . Muscle spasticity 05/21/2014  . Calculus of kidney 12/09/2013  . Renal colic 12/09/2013  . Hypercholesteremia 08/19/2013  . Hereditary and idiopathic neuropathy 08/19/2013  . Hypercholesterolemia without hypertriglyceridemia 08/19/2013  . Bladder infection, chronic 07/25/2012  . Disorder of bladder function 07/25/2012  . Incomplete bladder emptying 07/25/2012   . Microscopic hematuria 07/25/2012   Precious Bard, PT, DPT   09/04/2017, 1:30 PM  [facility] MAIN [facility] SERVICES [address] [place], [place], [address] Phone: [phone]   Fax:  [phone]  Name: Lisa Williams MRN: [id number] Date of Birth: Jul 13, 1960

## 2017-09-06 ENCOUNTER — Ambulatory Visit: Payer: PPO | Attending: Neurology

## 2017-09-06 DIAGNOSIS — M6281 Muscle weakness (generalized): Secondary | ICD-10-CM | POA: Insufficient documentation

## 2017-09-06 DIAGNOSIS — M5412 Radiculopathy, cervical region: Secondary | ICD-10-CM

## 2017-09-06 DIAGNOSIS — R2681 Unsteadiness on feet: Secondary | ICD-10-CM | POA: Insufficient documentation

## 2017-09-06 DIAGNOSIS — R2689 Other abnormalities of gait and mobility: Secondary | ICD-10-CM | POA: Diagnosis not present

## 2017-09-06 NOTE — Therapy (Signed)
[facility] MAIN [facility] SERVICES [address] [place], [place], [address] Phone: [phone]   Fax:  [phone]  Physical Therapy Treatment  Patient Details  Name: Lisa Williams MRN: [id number] Date of Birth: 1960-07-08 Referring Provider: Theora Master, MD   Encounter Date: 09/06/2017  PT End of Session - 09/06/17 1222    Visit Number  2    Number of Visits  8    Date for PT Re-Evaluation  10/02/17    PT Start Time  1030    PT Stop Time  1114    PT Time Calculation (min)  44 min    Equipment Utilized During Treatment  Gait belt    Activity Tolerance  Patient limited by pain    Behavior During Therapy  [facility] for tasks assessed/performed       Past Medical History:  Diagnosis Date  . Abdominal pain, right upper quadrant   . Back pain   . Calculus of kidney 12/09/2013  . Chronic back pain    unspecified  . Chronic left shoulder pain 07/19/2015  . Functional disorder of bladder    other  . Galactorrhea 11/26/2014   Chronic   . Hereditary and idiopathic neuropathy 08/19/2013  . HPV test positive   . Hypercholesteremia 08/19/2013  . Incomplete bladder emptying   . Microscopic hematuria   . MS (multiple sclerosis) (HCC)   . Muscle spasticity 05/21/2014  . Nonspecific findings on examination of urine    other  . Osteopenia   . Status post laparoscopic supracervical hysterectomy 11/26/2014  . Tobacco user 11/26/2014  . Wrist fracture     Past Surgical History:  Procedure Laterality Date  . bilateral tubal ligation  1996  . BREAST CYST EXCISION Left 2002  . KNEE SURGERY     right  . LAPAROSCOPIC SUPRACERVICAL HYSTERECTOMY  08/05/2013  . ORIF WRIST FRACTURE Left 01/17/2017   Procedure: OPEN REDUCTION INTERNAL FIXATION (ORIF) WRIST FRACTURE;  Surgeon: Lyndle Herrlich, MD;  Location: [facility] ORS;  Service: Orthopedics;  Laterality: Left;  . TUBAL LIGATION Bilateral   . VAGINAL HYSTERECTOMY  03/2006    There were no vitals filed  for this visit.  Subjective Assessment - 09/06/17 1034    Subjective  HEP compliant, neck pain improving, not as much tingling. Watched video for HEP.     Pertinent History  Pt is a 57 y/o F who presents with BLE weakness and difficulty ambulating due to h/o MS that was diagnosed in 1995. Pt denies any pain associated. Pt was admitted to the hospital in November 2018 due to MS exacerbation. Nov-Dec to HHPT-->[facility] until end of Jan. After rehab the pt was able to ambulate up to 100 ft with the RW. Pt has Bil AFO, her therapist at HHPT thinks she needs a new AFO as her feet still drag when she ambulates, pt reports HHPT mentioned a spring AFO. Pt has custom AFOs that were originally made 4 years ago. Goals: improve balance, to be able to get into the shower with her tub bench without assist, to be able to get up 2 steps at sister's home with railings on both sides but too far apart to reach both sides, to be able to drive adapted car. Pt is staying at other sister's house with one step to get inside and currently picking up L leg with UEs and then stepping in. Pt steps down leading with LLE. Pt wants to work on walking side to side as  this is challenging and she had not yet progressed to this at rehab. Pt able to get in/out the car with assist only to bring BLEs into the car. Hoping to drive by June or July with adapted car (already has one). Pt needs assist bringing LEs into and out of hospital bed. Pt able to perform sit>stand without assist from [facility] with a pillow on the seat for a boost. Pt denies any falls in the past 6 months. Pt has been increasing her time alone at home up to a few hours at a time to become more independent. Pt has both manual and power WC. Uses power WC at home, uses manual WC out in community. Pt able to self propel manual WC. Pt needs assist putting pants around ankles but she can pull them up. Pt can put on her shoes but needs help getting her leg up to do so. Pt able to  tie her shoelaces on her own. Pt has a tub/shower unit at home with a tub bench. No grab bars in the shower. Has hand held shower head. Pt currently taking a sponge bath. Pt has a regular height toilet with a BSC over top. Pt with h/o L wrist fx with no precautions, pt wore her soft brace to evaluation for comfort but reports her wrist has healed and is doing well following OT.     Limitations  Lifting;Standing;Walking;House hold activities    How long can you sit comfortably?  n/a    How long can you stand comfortably?  3 minutes without UE assist, limited by fatigue    How long can you walk comfortably?  100 ft, limited by fatigue    Patient Stated Goals  see above    Currently in Pain?  Yes    Pain Score  3     Pain Location  Neck    Pain Orientation  Posterior    Pain Descriptors / Indicators  Sharp    Pain Type  Acute pain    Pain Onset  In the past 7 days            supine    Radial nerve glides LUE 20x  Suboccipital release 5x 20 seconds   Sidebending with overpressure stretch 5x 20 seconds with L more tight than R  Rotation L and R with overpressure stretch 5x20 seconds   Cervical UPA and CPA glides in supine position  4x10 seconds each segment  Seated: STM and trigger point upper trap and cervical musculature with focus on levator scapulae.   Pt. response to medical necessity:  Patient will continue to benefit from skilled physical therapy to address cervical pain and limited ROM in addition to continuing current POC for MS.                PT Education - 09/06/17 1221    Education provided  Yes    Education Details  manual, hep compliance    Person(s) Educated  Patient    Methods  Explanation;Demonstration;Verbal cues    Comprehension  Verbalized understanding;Returned demonstration       PT Short Term Goals - 09/04/17 1323      PT SHORT TERM GOAL #1   Title  Pt will be completed HEP with min assist from caregiver at least 4 days/wk for  improved carryover between sessions    Time  2    Period  Weeks    Status  New    Target Date  09/18/17  PT SHORT TERM GOAL #2   Title  Patient will report a worst pain of 6/10 on VAS in cervical spine to improve tolerance with ADLs and reduced symptoms with activities.     Baseline  4/30: 9/10     Time  2    Period  Weeks    Status  New    Target Date  09/18/17      PT SHORT TERM GOAL #3   Title  Patient will perform cervical rotation at least 50 degrees, flexion and extension to 70 degrees, and sidebending to 40 degrees without pain    Baseline  4/30: R 50 L 34 Rotation, R 26 L 35 sb, Extension 35 flex 35     Time  2    Period  Weeks    Status  New    Target Date  09/18/17        PT Long Term Goals - 09/04/17 1326      PT LONG TERM GOAL #1   Title  Pt's 5xSTS will impove to equal to or below 2 minutes to demonstrate improved balance and BLE strength    Baseline  3.02 minutes    Time  6    Period  Weeks    Status  New      PT LONG TERM GOAL #2   Title  Pt's will improve to at least 0.5 m/s for improved safety ambulating in home    Baseline  0.14 m/s    Time  8    Period  Weeks    Status  New      PT LONG TERM GOAL #3   Title  Pt's Bil hip F strength will improve to at least 2+/5 BLEs for improved gait mechanics and safety    Baseline  L: 2/5, R: 1/5    Time  8    Period  Weeks    Status  New      PT LONG TERM GOAL #4   Title  Pt's ABC Scale will improve to at least 40% to demonstrate improved balance    Baseline  28.13%      PT LONG TERM GOAL #5   Title  Pt will be able to perform car transfer without assist to work towards pt's goal of returning to driving    Baseline  Requires assist to bring BLEs into/out of car    Time  6    Period  Weeks    Status  New      Additional Long Term Goals   Additional Long Term Goals  Yes      PT LONG TERM GOAL #6   Title  Pt will be able to perform tub bench into/out of shower transfer without assist for  improved independence with this task    Baseline  Taking sponge bath as she requires assist for transfer    Time  6    Period  Weeks    Status  New      PT LONG TERM GOAL #7   Title  Patient will perform cervical rotation at least 60 degrees, flexion and extension to 70 degrees, and sidebending to 40 degrees without pain    Baseline  4/30: R 50 L 34 Rotation, R 26 L 35 sb, Extension 35 flex 35     Time  4    Period  Weeks    Status  New    Target Date  10/02/17  PT LONG TERM GOAL #8   Title  Patient will improve NDI to <28% to reduce disability to mild  percieved disability for improved quality of life.     Baseline  4/30: 56% severe disability     Time  4    Period  Weeks    Status  New    Target Date  10/02/17      PT LONG TERM GOAL  #9   TITLE  Patient will report a worst pain of 3/10 on VAS in  cervical spine  to improve tolerance with ADLs and reduced symptoms with activities.     Baseline  4/30: 9/10     Time  4    Period  Weeks    Status  New    Target Date  10/02/17            Plan - 09/06/17 1227    Clinical Impression Statement  Patient's pain relieved after manual interventions. Improved ROM and mobility of cervical spine at end of session. Tingling in arms reduced after glides. Patient will continue to benefit from skilled physical therapy to address cervical pain and limited ROM in addition to continuing current POC for MS.     Rehab Potential  Good    PT Frequency  2x / week    PT Duration  4 weeks    PT Treatment/Interventions  ADLs/Self Care Home Management;Aquatic Therapy;Biofeedback;Cryotherapy;Electrical Stimulation;Iontophoresis 4mg /ml Dexamethasone;Traction;Moist Heat;Ultrasound;DME Instruction;Gait training;Stair training;Functional mobility training;Therapeutic activities;Therapeutic exercise;Balance training;Neuromuscular re-education;Patient/family education;Orthotic Fit/Training;Wheelchair mobility training;Manual techniques;Compression  bandaging;Passive range of motion;Dry needling;Energy conservation;Taping;Splinting    PT Next Visit Plan  suboccipital release, release cervical musculature    PT Home Exercise Plan  see sheet    Consulted and Agree with Plan of Care  Patient       Patient will benefit from skilled therapeutic intervention in order to improve the following deficits and impairments:  Abnormal gait, Decreased activity tolerance, Decreased balance, Decreased coordination, Decreased endurance, Decreased knowledge of use of DME, Decreased mobility, Decreased range of motion, Decreased safety awareness, Decreased strength, Difficulty walking, Impaired perceived functional ability, Impaired sensation, Impaired tone, Impaired UE functional use, Improper body mechanics, Postural dysfunction, Hypomobility, Impaired flexibility, Pain  Visit Diagnosis: Radiculopathy, cervical region  Muscle weakness (generalized)     Problem List Patient Active Problem List   Diagnosis Date Noted  . Wrist fracture 01/16/2017  . Health care maintenance 01/24/2016  . Blood pressure elevated without history of HTN 10/25/2015  . Multiple sclerosis (HCC) 10/02/2015  . Chronic left shoulder pain 07/19/2015  . Multiple sclerosis exacerbation (HCC) 07/14/2015  . MS (multiple sclerosis) (HCC) 11/26/2014  . Increased body mass index 11/26/2014  . HPV test positive 11/26/2014  . Status post laparoscopic supracervical hysterectomy 11/26/2014  . Galactorrhea 11/26/2014  . Tobacco user 11/26/2014  . Back ache 05/21/2014  . Adiposity 05/21/2014  . Disordered sleep 05/21/2014  . Muscle spasticity 05/21/2014  . Calculus of kidney 12/09/2013  . Renal colic 12/09/2013  . Hypercholesteremia 08/19/2013  . Hereditary and idiopathic neuropathy 08/19/2013  . Hypercholesterolemia without hypertriglyceridemia 08/19/2013  . Bladder infection, chronic 07/25/2012  . Disorder of bladder function 07/25/2012  . Incomplete bladder emptying 07/25/2012   . Microscopic hematuria 07/25/2012  Precious Bard, PT, DPT    09/06/2017, 12:28 PM  [place] [facility] MAIN [facility] SERVICES [address] [place], [place], [address] Phone: [phone]   Fax:  [phone]  Name: Lisa Williams MRN: [id number] Date of Birth: February 06, 1961

## 2017-09-11 ENCOUNTER — Ambulatory Visit: Payer: PPO

## 2017-09-11 DIAGNOSIS — G35 Multiple sclerosis: Secondary | ICD-10-CM | POA: Diagnosis not present

## 2017-09-11 DIAGNOSIS — M6281 Muscle weakness (generalized): Secondary | ICD-10-CM

## 2017-09-11 DIAGNOSIS — M5412 Radiculopathy, cervical region: Secondary | ICD-10-CM

## 2017-09-11 DIAGNOSIS — R2681 Unsteadiness on feet: Secondary | ICD-10-CM

## 2017-09-11 DIAGNOSIS — R2689 Other abnormalities of gait and mobility: Secondary | ICD-10-CM

## 2017-09-11 NOTE — Therapy (Signed)
[place] [facility] MAIN [facility] SERVICES [address] [place], [place], [address] Phone: [phone]   Fax:  [phone]  Physical Therapy Treatment  Patient Details  Name: Lisa Williams MRN: [id number] Date of Birth: 1960-08-09 Referring Provider: Theora Master, MD   Encounter Date: 09/11/2017  PT End of Session - 09/11/17 1205    Visit Number  3    Number of Visits  8    Date for PT Re-Evaluation  10/02/17    PT Start Time  1117    PT Stop Time  1201    PT Time Calculation (min)  44 min    Activity Tolerance  Patient tolerated treatment well;Patient limited by pain    Behavior During Therapy  [facility] for tasks assessed/performed       Past Medical History:  Diagnosis Date  . Abdominal pain, right upper quadrant   . Back pain   . Calculus of kidney 12/09/2013  . Chronic back pain    unspecified  . Chronic left shoulder pain 07/19/2015  . Functional disorder of bladder    other  . Galactorrhea 11/26/2014   Chronic   . Hereditary and idiopathic neuropathy 08/19/2013  . HPV test positive   . Hypercholesteremia 08/19/2013  . Incomplete bladder emptying   . Microscopic hematuria   . MS (multiple sclerosis) (HCC)   . Muscle spasticity 05/21/2014  . Nonspecific findings on examination of urine    other  . Osteopenia   . Status post laparoscopic supracervical hysterectomy 11/26/2014  . Tobacco user 11/26/2014  . Wrist fracture     Past Surgical History:  Procedure Laterality Date  . bilateral tubal ligation  1996  . BREAST CYST EXCISION Left 2002  . KNEE SURGERY     right  . LAPAROSCOPIC SUPRACERVICAL HYSTERECTOMY  08/05/2013  . ORIF WRIST FRACTURE Left 01/17/2017   Procedure: OPEN REDUCTION INTERNAL FIXATION (ORIF) WRIST FRACTURE;  Surgeon: Lyndle Herrlich, MD;  Location: [facility] ORS;  Service: Orthopedics;  Laterality: Left;  . TUBAL LIGATION Bilateral   . VAGINAL HYSTERECTOMY  03/2006    There were no vitals filed for this  visit.  Subjective Assessment - 09/11/17 1121    Subjective  Pt reports doing well, reports HEP is helping her symptoms. She has been keeping ice on the area as needed.   (Pended)     Currently in Pain?  Yes  (Pended)     Pain Score  4   (Pended)     Pain Location  --  (Pended)  Left lower trap        Myofascial Trigger Point Release: -Left lower traps/rhomboids: 8 minutes -Left pec major/minor: 5 minutes   Passive ROM/Stretching: -cervical rotation 2x30sec bilat   -Supine  scapular retraction stretch (pec minor) 5x30sec.  -Supine Wand flexion: 3x10x5 sec hold (intermittent between above stretch) -Seated scapular retraction: 10x3sec H -Seated Retraction + depression in WC pushup position: 1x10 -Seated Scapular Ts, unsuccessful d/.t HEAVY upper traps overcompensation in abduction.   Pt. response to medical necessity:  Patient will continue to benefit from skilled physical therapy to address cervical pain and limited ROM in addition to continuing current POC for MS.     PT Short Term Goals - 09/04/17 1323      PT SHORT TERM GOAL #1   Title  Pt will be completed HEP with min assist from caregiver at least 4 days/wk for improved carryover between sessions    Time  2    Period  Weeks    Status  New    Target Date  09/18/17      PT SHORT TERM GOAL #2   Title  Patient will report a worst pain of 6/10 on VAS in cervical spine to improve tolerance with ADLs and reduced symptoms with activities.     Baseline  4/30: 9/10     Time  2    Period  Weeks    Status  New    Target Date  09/18/17      PT SHORT TERM GOAL #3   Title  Patient will perform cervical rotation at least 50 degrees, flexion and extension to 70 degrees, and sidebending to 40 degrees without pain    Baseline  4/30: R 50 L 34 Rotation, R 26 L 35 sb, Extension 35 flex 35     Time  2    Period  Weeks    Status  New    Target Date  09/18/17        PT Long Term Goals - 09/04/17 1326      PT LONG TERM GOAL #1    Title  Pt's 5xSTS will impove to equal to or below 2 minutes to demonstrate improved balance and BLE strength    Baseline  3.02 minutes    Time  6    Period  Weeks    Status  New      PT LONG TERM GOAL #2   Title  Pt's will improve to at least 0.5 m/s for improved safety ambulating in home    Baseline  0.14 m/s    Time  8    Period  Weeks    Status  New      PT LONG TERM GOAL #3   Title  Pt's Bil hip F strength will improve to at least 2+/5 BLEs for improved gait mechanics and safety    Baseline  L: 2/5, R: 1/5    Time  8    Period  Weeks    Status  New      PT LONG TERM GOAL #4   Title  Pt's ABC Scale will improve to at least 40% to demonstrate improved balance    Baseline  28.13%      PT LONG TERM GOAL #5   Title  Pt will be able to perform car transfer without assist to work towards pt's goal of returning to driving    Baseline  Requires assist to bring BLEs into/out of car    Time  6    Period  Weeks    Status  New      Additional Long Term Goals   Additional Long Term Goals  Yes      PT LONG TERM GOAL #6   Title  Pt will be able to perform tub bench into/out of shower transfer without assist for improved independence with this task    Baseline  Taking sponge bath as she requires assist for transfer    Time  6    Period  Weeks    Status  New      PT LONG TERM GOAL #7   Title  Patient will perform cervical rotation at least 60 degrees, flexion and extension to 70 degrees, and sidebending to 40 degrees without pain    Baseline  4/30: R 50 L 34 Rotation, R 26 L 35 sb, Extension 35 flex 35     Time  4    Period  Weeks    Status  New    Target Date  10/02/17      PT LONG TERM GOAL #8   Title  Patient will improve NDI to <28% to reduce disability to mild  percieved disability for improved quality of life.     Baseline  4/30: 56% severe disability     Time  4    Period  Weeks    Status  New    Target Date  10/02/17      PT LONG TERM GOAL  #9   TITLE   Patient will report a worst pain of 3/10 on VAS in  cervical spine  to improve tolerance with ADLs and reduced symptoms with activities.     Baseline  4/30: 9/10     Time  4    Period  Weeks    Status  New    Target Date  10/02/17            Plan - 09/11/17 1206    Clinical Impression Statement  Started session with supine on table addressing Left posterior scapular myofascial pain. Noted signififcant anterior deviation of GHJ in supine d/t adaptive shortening of left pec minor. Attention then driected to Left pec minor release, stretch AA/ROM, and then seated activation of scapualr retractors and depressors. Pt repords well to MFR on left dorsal side with improved pain rating, however clavicular pec major and pec minor are significantly more tight and tender to palation. THerapies adjusted to patient preference. Improved sitting posture at end of session with improved scapular ROM. Pt progressing well. No tingling in arm this session.     Rehab Potential  Good    PT Frequency  2x / week    PT Duration  4 weeks    PT Treatment/Interventions  ADLs/Self Care Home Management;Aquatic Therapy;Biofeedback;Cryotherapy;Electrical Stimulation;Iontophoresis 4mg /ml Dexamethasone;Traction;Moist Heat;Ultrasound;DME Instruction;Gait training;Stair training;Functional mobility training;Therapeutic activities;Therapeutic exercise;Balance training;Neuromuscular re-education;Patient/family education;Orthotic Fit/Training;Wheelchair mobility training;Manual techniques;Compression bandaging;Passive range of motion;Dry needling;Energy conservation;Taping;Splinting    PT Next Visit Plan  suboccipital release, release cervical musculature, revisit pec minor stretching/release work consider adding into HEP.     PT Home Exercise Plan  see sheet    Consulted and Agree with Plan of Care  Patient       Patient will benefit from skilled therapeutic intervention in order to improve the following deficits and  impairments:  Abnormal gait, Decreased activity tolerance, Decreased balance, Decreased coordination, Decreased endurance, Decreased knowledge of use of DME, Decreased mobility, Decreased range of motion, Decreased safety awareness, Decreased strength, Difficulty walking, Impaired perceived functional ability, Impaired sensation, Impaired tone, Impaired UE functional use, Improper body mechanics, Postural dysfunction, Hypomobility, Impaired flexibility, Pain  Visit Diagnosis: Radiculopathy, cervical region  Muscle weakness (generalized)  Unsteadiness on feet  Other abnormalities of gait and mobility     Problem List Patient Active Problem List   Diagnosis Date Noted  . Wrist fracture 01/16/2017  . Health care maintenance 01/24/2016  . Blood pressure elevated without history of HTN 10/25/2015  . Multiple sclerosis (HCC) 10/02/2015  . Chronic left shoulder pain 07/19/2015  . Multiple sclerosis exacerbation (HCC) 07/14/2015  . MS (multiple sclerosis) (HCC) 11/26/2014  . Increased body mass index 11/26/2014  . HPV test positive 11/26/2014  . Status post laparoscopic supracervical hysterectomy 11/26/2014  . Galactorrhea 11/26/2014  . Tobacco user 11/26/2014  . Back ache 05/21/2014  . Adiposity 05/21/2014  . Disordered sleep 05/21/2014  . Muscle spasticity 05/21/2014  . Calculus of kidney 12/09/2013  .  Renal colic 12/09/2013  . Hypercholesteremia 08/19/2013  . Hereditary and idiopathic neuropathy 08/19/2013  . Hypercholesterolemia without hypertriglyceridemia 08/19/2013  . Bladder infection, chronic 07/25/2012  . Disorder of bladder function 07/25/2012  . Incomplete bladder emptying 07/25/2012  . Microscopic hematuria 07/25/2012    12:14 PM, 09/11/17 Rosamaria Lints, PT, DPT Physical Therapist - [facility]      [place] 09/11/2017, 12:13 PM  [place] [facility] MAIN [facility] SERVICES [address]  [place], [place], [address] Phone: [phone]   Fax:  [phone]  Name: Cornelious Diven Sprick MRN: [id number] Date of Birth: 1960-11-21

## 2017-09-14 ENCOUNTER — Ambulatory Visit: Payer: PPO

## 2017-09-14 DIAGNOSIS — R2689 Other abnormalities of gait and mobility: Secondary | ICD-10-CM

## 2017-09-14 DIAGNOSIS — R2681 Unsteadiness on feet: Secondary | ICD-10-CM

## 2017-09-14 DIAGNOSIS — M5412 Radiculopathy, cervical region: Secondary | ICD-10-CM | POA: Diagnosis not present

## 2017-09-14 DIAGNOSIS — M6281 Muscle weakness (generalized): Secondary | ICD-10-CM

## 2017-09-14 NOTE — Therapy (Signed)
[facility] MAIN [facility] SERVICES [address] [place], [place], [address] Phone: [phone]   Fax:  [phone]  Physical Therapy Treatment  Patient Details  Name: Lisa Williams MRN: [id number] Date of Birth: 05/14/1960 Referring Provider: Theora Master, MD   Encounter Date: 09/14/2017  PT End of Session - 09/14/17 1137    Visit Number  4    Number of Visits  8    Date for PT Re-Evaluation  10/02/17    PT Start Time  1030    PT Stop Time  1114    PT Time Calculation (min)  44 min    Activity Tolerance  Patient tolerated treatment well;Patient limited by pain    Behavior During Therapy  [facility] for tasks assessed/performed       Past Medical History:  Diagnosis Date  . Abdominal pain, right upper quadrant   . Back pain   . Calculus of kidney 12/09/2013  . Chronic back pain    unspecified  . Chronic left shoulder pain 07/19/2015  . Functional disorder of bladder    other  . Galactorrhea 11/26/2014   Chronic   . Hereditary and idiopathic neuropathy 08/19/2013  . HPV test positive   . Hypercholesteremia 08/19/2013  . Incomplete bladder emptying   . Microscopic hematuria   . MS (multiple sclerosis) (HCC)   . Muscle spasticity 05/21/2014  . Nonspecific findings on examination of urine    other  . Osteopenia   . Status post laparoscopic supracervical hysterectomy 11/26/2014  . Tobacco user 11/26/2014  . Wrist fracture     Past Surgical History:  Procedure Laterality Date  . bilateral tubal ligation  1996  . BREAST CYST EXCISION Left 2002  . KNEE SURGERY     right  . LAPAROSCOPIC SUPRACERVICAL HYSTERECTOMY  08/05/2013  . ORIF WRIST FRACTURE Left 01/17/2017   Procedure: OPEN REDUCTION INTERNAL FIXATION (ORIF) WRIST FRACTURE;  Surgeon: Lyndle Herrlich, MD;  Location: [facility] ORS;  Service: Orthopedics;  Laterality: Left;  . TUBAL LIGATION Bilateral   . VAGINAL HYSTERECTOMY  03/2006    There were no vitals filed for this  visit.  Subjective Assessment - 09/14/17 1136    Subjective  Patient reports doing well, reports symptoms are changing from her neck to her shoulder/scapular region. Compliant with HEP.     Patient is accompained by:  Family member    Pertinent History  Pt is a 57 y/o F who presents with BLE weakness and difficulty ambulating due to h/o MS that was diagnosed in 1995. Pt denies any pain associated. Pt was admitted to the hospital in November 2018 due to MS exacerbation. Nov-Dec to [facility]-->[facility] until end of Jan. After rehab the pt was able to ambulate up to 100 ft with the RW. Pt has Bil AFO, her therapist at [facility] thinks she needs a new AFO as her feet still drag when she ambulates, pt reports HHPT mentioned a spring AFO. Pt has custom AFOs that were originally made 4 years ago. Goals: improve balance, to be able to get into the shower with her tub bench without assist, to be able to get up 2 steps at sister's home with railings on both sides but too far apart to reach both sides, to be able to drive adapted car. Pt is staying at other sister's house with one step to get inside and currently picking up L leg with UEs and then stepping in. Pt steps down leading with LLE. Pt wants  to work on walking side to side as this is challenging and she had not yet progressed to this at rehab. Pt able to get in/out the car with assist only to bring BLEs into the car. Hoping to drive by June or July with adapted car (already has one). Pt needs assist bringing LEs into and out of hospital bed. Pt able to perform sit>stand without assist from [facility] with a pillow on the seat for a boost. Pt denies any falls in the past 6 months. Pt has been increasing her time alone at home up to a few hours at a time to become more independent. Pt has both manual and power WC. Uses power WC at home, uses manual WC out in community. Pt able to self propel manual WC. Pt needs assist putting pants around ankles but she can pull them up.  Pt can put on her shoes but needs help getting her leg up to do so. Pt able to tie her shoelaces on her own. Pt has a tub/shower unit at home with a tub bench. No grab bars in the shower. Has hand held shower head. Pt currently taking a sponge bath. Pt has a regular height toilet with a BSC over top. Pt with h/o L wrist fx with no precautions, pt wore her soft brace to evaluation for comfort but reports her wrist has healed and is doing well following OT.     Limitations  Lifting;Standing;Walking;House hold activities    How long can you stand comfortably?  3 minutes without UE assist, limited by fatigue    How long can you walk comfortably?  100 ft, limited by fatigue    Patient Stated Goals  see above    Currently in Pain?  Yes    Pain Score  3     Pain Location  Neck    Pain Orientation  Posterior    Pain Descriptors / Indicators  Aching;Sore    Pain Onset  1 to 4 weeks ago    Pain Frequency  Intermittent       Radial nerve glides LUE 20x   Suboccipital release 5x 20 seconds    Sidebending with overpressure stretch 5x 20 seconds with L more tight than R   Rotation L and R with overpressure stretch 5x20 seconds   Cervical UPA and CPA glides in supine position  4x10 seconds each segment   First rib mobilization Lside 3x10 seconds  Seated: STM and trigger point upper trap and cervical musculature with focus on levator scapulae.    STM to subscap in supine positions 5 minutes  Pt. response to medical necessity:  Patient will continue to benefit from skilled physical therapy to address cervical pain and limited ROM in addition to continuing current POC for MS.                         PT Education - 09/14/17 1137    Education provided  Yes    Education Details  manual, HEP compliance     Person(s) Educated  Patient    Methods  Explanation;Demonstration;Verbal cues    Comprehension  Verbalized understanding;Returned demonstration       PT Short Term Goals  - 09/04/17 1323      PT SHORT TERM GOAL #1   Title  Pt will be completed HEP with min assist from caregiver at least 4 days/wk for improved carryover between sessions    Time  2  Period  Weeks    Status  New    Target Date  09/18/17      PT SHORT TERM GOAL #2   Title  Patient will report a worst pain of 6/10 on VAS in cervical spine to improve tolerance with ADLs and reduced symptoms with activities.     Baseline  4/30: 9/10     Time  2    Period  Weeks    Status  New    Target Date  09/18/17      PT SHORT TERM GOAL #3   Title  Patient will perform cervical rotation at least 50 degrees, flexion and extension to 70 degrees, and sidebending to 40 degrees without pain    Baseline  4/30: R 50 L 34 Rotation, R 26 L 35 sb, Extension 35 flex 35     Time  2    Period  Weeks    Status  New    Target Date  09/18/17        PT Long Term Goals - 09/04/17 1326      PT LONG TERM GOAL #1   Title  Pt's 5xSTS will impove to equal to or below 2 minutes to demonstrate improved balance and BLE strength    Baseline  3.02 minutes    Time  6    Period  Weeks    Status  New      PT LONG TERM GOAL #2   Title  Pt's will improve to at least 0.5 m/s for improved safety ambulating in home    Baseline  0.14 m/s    Time  8    Period  Weeks    Status  New      PT LONG TERM GOAL #3   Title  Pt's Bil hip F strength will improve to at least 2+/5 BLEs for improved gait mechanics and safety    Baseline  L: 2/5, R: 1/5    Time  8    Period  Weeks    Status  New      PT LONG TERM GOAL #4   Title  Pt's ABC Scale will improve to at least 40% to demonstrate improved balance    Baseline  28.13%      PT LONG TERM GOAL #5   Title  Pt will be able to perform car transfer without assist to work towards pt's goal of returning to driving    Baseline  Requires assist to bring BLEs into/out of car    Time  6    Period  Weeks    Status  New      Additional Long Term Goals   Additional Long Term  Goals  Yes      PT LONG TERM GOAL #6   Title  Pt will be able to perform tub bench into/out of shower transfer without assist for improved independence with this task    Baseline  Taking sponge bath as she requires assist for transfer    Time  6    Period  Weeks    Status  New      PT LONG TERM GOAL #7   Title  Patient will perform cervical rotation at least 60 degrees, flexion and extension to 70 degrees, and sidebending to 40 degrees without pain    Baseline  4/30: R 50 L 34 Rotation, R 26 L 35 sb, Extension 35 flex 35     Time  4  Period  Weeks    Status  New    Target Date  10/02/17      PT LONG TERM GOAL #8   Title  Patient will improve NDI to <28% to reduce disability to mild  percieved disability for improved quality of life.     Baseline  4/30: 56% severe disability     Time  4    Period  Weeks    Status  New    Target Date  10/02/17      PT LONG TERM GOAL  #9   TITLE  Patient will report a worst pain of 3/10 on VAS in  cervical spine  to improve tolerance with ADLs and reduced symptoms with activities.     Baseline  4/30: 9/10     Time  4    Period  Weeks    Status  New    Target Date  10/02/17            Plan - 09/14/17 1139    Clinical Impression Statement  Patient responds well to manual with noted tenderness to medial superior scapula border of L. Repeated STM and trigger point activation resulted in release of muscle tension and neurological compensatory tingling. Improved shoulder and cervical ROM after subscap STM noted.  Patient will continue to benefit from skilled physical therapy to address cervical pain and limited ROM in addition to continuing current POC for MS.     Rehab Potential  Good    PT Frequency  2x / week    PT Duration  4 weeks    PT Treatment/Interventions  ADLs/Self Care Home Management;Aquatic Therapy;Biofeedback;Cryotherapy;Electrical Stimulation;Iontophoresis 4mg /ml Dexamethasone;Traction;Moist Heat;Ultrasound;DME Instruction;Gait  training;Stair training;Functional mobility training;Therapeutic activities;Therapeutic exercise;Balance training;Neuromuscular re-education;Patient/family education;Orthotic Fit/Training;Wheelchair mobility training;Manual techniques;Compression bandaging;Passive range of motion;Dry needling;Energy conservation;Taping;Splinting    PT Next Visit Plan  suboccipital release, release cervical musculature, revisit pec minor stretching/release work consider adding into HEP.     PT Home Exercise Plan  see sheet    Consulted and Agree with Plan of Care  Patient       Patient will benefit from skilled therapeutic intervention in order to improve the following deficits and impairments:  Abnormal gait, Decreased activity tolerance, Decreased balance, Decreased coordination, Decreased endurance, Decreased knowledge of use of DME, Decreased mobility, Decreased range of motion, Decreased safety awareness, Decreased strength, Difficulty walking, Impaired perceived functional ability, Impaired sensation, Impaired tone, Impaired UE functional use, Improper body mechanics, Postural dysfunction, Hypomobility, Impaired flexibility, Pain  Visit Diagnosis: Radiculopathy, cervical region  Muscle weakness (generalized)  Unsteadiness on feet  Other abnormalities of gait and mobility     Problem List Patient Active Problem List   Diagnosis Date Noted  . Wrist fracture 01/16/2017  . Health care maintenance 01/24/2016  . Blood pressure elevated without history of HTN 10/25/2015  . Multiple sclerosis (HCC) 10/02/2015  . Chronic left shoulder pain 07/19/2015  . Multiple sclerosis exacerbation (HCC) 07/14/2015  . MS (multiple sclerosis) (HCC) 11/26/2014  . Increased body mass index 11/26/2014  . HPV test positive 11/26/2014  . Status post laparoscopic supracervical hysterectomy 11/26/2014  . Galactorrhea 11/26/2014  . Tobacco user 11/26/2014  . Back ache 05/21/2014  . Adiposity 05/21/2014  . Disordered sleep  05/21/2014  . Muscle spasticity 05/21/2014  . Calculus of kidney 12/09/2013  . Renal colic 12/09/2013  . Hypercholesteremia 08/19/2013  . Hereditary and idiopathic neuropathy 08/19/2013  . Hypercholesterolemia without hypertriglyceridemia 08/19/2013  . Bladder infection, chronic 07/25/2012  . Disorder of bladder function  07/25/2012  . Incomplete bladder emptying 07/25/2012  . Microscopic hematuria 07/25/2012   Precious Bard, PT, DPT   09/14/2017, 11:40 AM  [facility] MAIN [facility] SERVICES [address] [place], [place], [address] Phone: [phone]   Fax:  [phone]  Name: Lisa Williams MRN: [id number] Date of Birth: August 09, 1960

## 2017-09-19 ENCOUNTER — Ambulatory Visit: Payer: PPO

## 2017-09-19 DIAGNOSIS — M6281 Muscle weakness (generalized): Secondary | ICD-10-CM

## 2017-09-19 DIAGNOSIS — M5412 Radiculopathy, cervical region: Secondary | ICD-10-CM

## 2017-09-19 DIAGNOSIS — R2689 Other abnormalities of gait and mobility: Secondary | ICD-10-CM

## 2017-09-19 DIAGNOSIS — R2681 Unsteadiness on feet: Secondary | ICD-10-CM

## 2017-09-19 NOTE — Therapy (Signed)
[place] MAIN [facility] SERVICES [place], [place], [address] Phone: [phone]   Fax:  [phone]  Physical Therapy Treatment  Patient Details  Name: Lisa Williams MRN: [id number] Date of Birth: 10-11-1960 Referring Provider: Gurney Maxin, MD   Encounter Date: 09/19/2017  PT End of Session - 09/19/17 1258    Visit Number  10    Number of Visits  26    Date for PT Re-Evaluation  11/14/17    PT Start Time  1101    PT Stop Time  1150    PT Time Calculation (min)  49 min    Equipment Utilized During Treatment  Gait belt    Activity Tolerance  Patient tolerated treatment well;Patient limited by fatigue    Behavior During Therapy  [facility] for tasks assessed/performed       Past Medical History:  Diagnosis Date  . Abdominal pain, right upper quadrant   . Back pain   . Calculus of kidney 12/09/2013  . Chronic back pain    unspecified  . Chronic left shoulder pain 07/19/2015  . Functional disorder of bladder    other  . Galactorrhea 11/26/2014   Chronic   . Hereditary and idiopathic neuropathy 08/19/2013  . HPV test positive   . Hypercholesteremia 08/19/2013  . Incomplete bladder emptying   . Microscopic hematuria   . MS (multiple sclerosis) ([place])   . Muscle spasticity 05/21/2014  . Nonspecific findings on examination of urine    other  . Osteopenia   . Status post laparoscopic supracervical hysterectomy 11/26/2014  . Tobacco user 11/26/2014  . Wrist fracture     Past Surgical History:  Procedure Laterality Date  . bilateral tubal ligation  1996  . BREAST CYST EXCISION Left 2002  . KNEE SURGERY     right  . LAPAROSCOPIC SUPRACERVICAL HYSTERECTOMY  08/05/2013  . ORIF WRIST FRACTURE Left 01/17/2017   Procedure: OPEN REDUCTION INTERNAL FIXATION (ORIF) WRIST FRACTURE;  Surgeon: Lovell Sheehan, MD;  Location: [facility] ORS;  Service: Orthopedics;  Laterality: Left;  . TUBAL LIGATION Bilateral   . VAGINAL HYSTERECTOMY   03/2006    There were no vitals filed for this visit.  Subjective Assessment - 09/19/17 1256    Subjective  Patient reports her pain is getting better and she is compliant with HEP. Has been completing her HEP for MS and neck. Understands this visit is focusing on goal re-assessment of MS progress.     Patient is accompained by:  Family member    Pertinent History  Pt is a 57 y/o F who presents with BLE weakness and difficulty ambulating due to h/o MS that was diagnosed in 1995. Pt denies any pain associated. Pt was admitted to the hospital in November 2018 due to MS exacerbation. Nov-Dec to HHPT-->[facility] until end of Jan. After rehab the pt was able to ambulate up to 100 ft with the RW. Pt has Bil AFO, her therapist at [place] thinks she needs a new AFO as her feet still drag when she ambulates, pt reports HHPT mentioned a spring AFO. Pt has custom AFOs that were originally made 4 years ago. Goals: improve balance, to be able to get into the shower with her tub bench without assist, to be able to get up 2 steps at sister's home with railings on both sides but too far apart to reach both sides, to be able to drive adapted car. Pt is staying at other sister's house with  one step to get inside and currently picking up L leg with UEs and then stepping in. Pt steps down leading with LLE. Pt wants to work on walking side to side as this is challenging and she had not yet progressed to this at rehab. Pt able to get in/out the car with assist only to bring BLEs into the car. Hoping to drive by June or July with adapted car (already has one). Pt needs assist bringing LEs into and out of hospital bed. Pt able to perform sit>stand without assist from [facility] with a pillow on the seat for a boost. Pt denies any falls in the past 6 months. Pt has been increasing her time alone at home up to a few hours at a time to become more independent. Pt has both manual and power WC. Uses power WC at home, uses manual WC out  in community. Pt able to self propel manual WC. Pt needs assist putting pants around ankles but she can pull them up. Pt can put on her shoes but needs help getting her leg up to do so. Pt able to tie her shoelaces on her own. Pt has a tub/shower unit at home with a tub bench. No grab bars in the shower. Has hand held shower head. Pt currently taking a sponge bath. Pt has a regular height toilet with a BSC over top. Pt with h/o L wrist fx with no precautions, pt wore her soft brace to evaluation for comfort but reports her wrist has healed and is doing well following OT.     Limitations  Lifting;Standing;Walking;House hold activities    How long can you stand comfortably?  3 minutes without UE assist, limited by fatigue    How long can you walk comfortably?  100 ft, limited by fatigue    Patient Stated Goals  see above    Currently in Pain?  No/denies       10MWT=.38 .7 seconds= .26 m/s  5x STS : 1 min 30.9 seconds with two adjustments of R  Foot.   LMMT: L 1+/5, R1/5  ABC=24.44%   Car transfers: use of towel under thigh to allow patient independence in transfer, block opp leg that is being lifted                  PT Education - 09/19/17 1257    Education provided  Yes    Education Details  goal progression, exercise technique, car transfers.     Person(s) Educated  Patient    Methods  Explanation;Demonstration;Verbal cues    Comprehension  Verbalized understanding;Returned demonstration       PT Short Term Goals - 09/04/17 1323      PT SHORT TERM GOAL #1   Title  Pt will be completed HEP with min assist from caregiver at least 4 days/wk for improved carryover between sessions    Time  2    Period  Weeks    Status  New    Target Date  09/18/17      PT SHORT TERM GOAL #2   Title  Patient will report a worst pain of 6/10 on VAS in cervical spine to improve tolerance with ADLs and reduced symptoms with activities.     Baseline  4/30: 9/10     Time  2    Period   Weeks    Status  New    Target Date  09/18/17      PT SHORT TERM GOAL #3  Title  Patient will perform cervical rotation at least 50 degrees, flexion and extension to 70 degrees, and sidebending to 40 degrees without pain    Baseline  4/30: R 50 L 34 Rotation, R 26 L 35 sb, Extension 35 flex 35     Time  2    Period  Weeks    Status  New    Target Date  09/18/17        PT Long Term Goals - 09/19/17 1110      PT LONG TERM GOAL #1   Title  Pt's 5xSTS will impove to equal to or below 2 minutes to demonstrate improved balance and BLE strength    Baseline  3.02 minutes; 5/15: 1 min 30 seconds    Time  6    Period  Weeks    Status  Achieved      PT LONG TERM GOAL #2   Title  Pt's 40mT will improve to at least 0.5 m/s for improved safety ambulating in home    Baseline  0.14 m/s; 5/15: .222m     Time  8    Period  Weeks    Status  Partially Met      PT LONG TERM GOAL #3   Title  Pt's Bil hip F strength will improve to at least 2+/5 BLEs for improved gait mechanics and safety    Baseline  L: 2/5, R: 1/5    Time  8    Period  Weeks    Status  New      PT LONG TERM GOAL #4   Title  Pt's ABC Scale will improve to at least 40% to demonstrate improved balance    Baseline  28.13% 5/15; 24.4%  (Pended)     Time  6  (Pended)     Period  Weeks  (Pended)     Status  New  (Pended)     Target Date  10/31/17  (Pended)       PT LONG TERM GOAL #5   Title  Pt will be able to perform car transfer without assist to work towards pt's goal of returning to driving    Baseline  only needs help with one leg to put onto running board to get into sisters car  (Pended)     Time  6    Period  Weeks    Status  Partially Met  (Pended)     Target Date  10/31/17  (Pended)       Additional Long Term Goals   Additional Long Term Goals  Yes      PT LONG TERM GOAL #6   Title  Pt will be able to perform tub bench into/out of shower transfer without assist for improved independence with this task     Baseline  challenged with steps at this time, getting better, stands and gives sponge bath independently now   (Pended)     Time  6    Period  Weeks    Status  Partially Met  (Pended)       PT LONG TERM GOAL #7   Title  Patient will perform cervical rotation at least 60 degrees, flexion and extension to 70 degrees, and sidebending to 40 degrees without pain    Baseline  4/30: R 50 L 34 Rotation, R 26 L 35 sb, Extension 35 flex 35     Time  4    Period  Weeks    Status  New  PT LONG TERM GOAL #8   Title  Patient will improve NDI to <28% to reduce disability to mild  percieved disability for improved quality of life.     Baseline  4/30: 56% severe disability     Time  4    Period  Weeks    Status  New      PT LONG TERM GOAL  #9   TITLE  Patient will report a worst pain of 3/10 on VAS in  cervical spine  to improve tolerance with ADLs and reduced symptoms with activities.     Baseline  4/30: 9/10     Time  4    Period  Weeks    Status  New      PT LONG TERM GOAL  #10   TITLE  Pt's 5xSTS will impove to equal to or below 50 seconds to demonstrate improved balance and BLE strength    Baseline  5/15: 1 min 30.9 seconds    Time  6    Period  Weeks    Status  New    Target Date  10/31/17            Plan - 09/19/17 1301    Clinical Impression Statement  Patient demonstrates great progression/meeting goals at this time. 10MWT=.26 m/s at this time demonstrating an almost double in gait speed. 5xSTS=1 min 30.9 seconds which is half of her previous time of 3 minutes at evaluation. Patient will continue to benefit on skilled therapy focusing on mobility, gait, transfers, and strengthening to improve quality of life.     Rehab Potential  Good    PT Frequency  2x / week    PT Duration  8 weeks    PT Treatment/Interventions  ADLs/Self Care Home Management;Aquatic Therapy;Biofeedback;Cryotherapy;Electrical Stimulation;Iontophoresis 96m/ml Dexamethasone;Traction;Moist  Heat;Ultrasound;DME Instruction;Gait training;Stair training;Functional mobility training;Therapeutic activities;Therapeutic exercise;Balance training;Neuromuscular re-education;Patient/family education;Orthotic Fit/Training;Wheelchair mobility training;Manual techniques;Compression bandaging;Passive range of motion;Dry needling;Energy conservation;Taping;Splinting    PT Next Visit Plan  assess need for new AFOs or other orthotic to assist with ambulation, initiate strengthening program, practice and improve technique for bed mobility and sit<>stand    PT Home Exercise Plan  to initiate next session    Consulted and Agree with Plan of Care  Patient       Patient will benefit from skilled therapeutic intervention in order to improve the following deficits and impairments:  Abnormal gait, Decreased activity tolerance, Decreased balance, Decreased coordination, Decreased endurance, Decreased knowledge of use of DME, Decreased mobility, Decreased range of motion, Decreased safety awareness, Decreased strength, Difficulty walking, Impaired perceived functional ability, Impaired sensation, Impaired tone, Impaired UE functional use, Improper body mechanics, Postural dysfunction  Visit Diagnosis: Other abnormalities of gait and mobility  Unsteadiness on feet  Muscle weakness (generalized)  Radiculopathy, cervical region     Problem List Patient Active Problem List   Diagnosis Date Noted  . Wrist fracture 01/16/2017  . Health care maintenance 01/24/2016  . Blood pressure elevated without history of HTN 10/25/2015  . Multiple sclerosis ([place] 10/02/2015  . Chronic left shoulder pain 07/19/2015  . [place]ation ([place] 07/14/2015  . MS (multiple sclerosis) ([place] 11/26/2014  . Increased body mass index 11/26/2014  . HPV test positive 11/26/2014  . Status post laparoscopic supracervical hysterectomy 11/26/2014  . Galactorrhea 11/26/2014  . Tobacco user 11/26/2014  . Back ache  05/21/2014  . Adiposity 05/21/2014  . Disordered sleep 05/21/2014  . Muscle spasticity 05/21/2014  . Calculus of kidney 12/09/2013  . Renal  colic 71/16/5790  . Hypercholesteremia 08/19/2013  . Hereditary and idiopathic neuropathy 08/19/2013  . Hypercholesterolemia without hypertriglyceridemia 08/19/2013  . Bladder infection, chronic 07/25/2012  . Disorder of bladder function 07/25/2012  . Incomplete bladder emptying 07/25/2012  . Microscopic hematuria 07/25/2012  Janna Arch, PT, DPT    09/19/2017, 2:55 PM  [place] MAIN [facility] SERVICES [address] [place], [place], [address] Phone: [phone]   Fax:  [phone]  Name: Kamalei Roeder Cullars MRN: [id number] Date of Birth: [birth year]

## 2017-09-24 ENCOUNTER — Ambulatory Visit: Payer: PPO

## 2017-09-24 DIAGNOSIS — R2681 Unsteadiness on feet: Secondary | ICD-10-CM

## 2017-09-24 DIAGNOSIS — M5412 Radiculopathy, cervical region: Secondary | ICD-10-CM | POA: Diagnosis not present

## 2017-09-24 DIAGNOSIS — M6281 Muscle weakness (generalized): Secondary | ICD-10-CM

## 2017-09-24 DIAGNOSIS — R2689 Other abnormalities of gait and mobility: Secondary | ICD-10-CM

## 2017-09-24 NOTE — Therapy (Signed)
[place] MAIN [facility] SERVICES [address] [place], [place], [address] Phone: [phone]   Fax:  [phone]  Physical Therapy Treatment  Patient Details  Name: Lisa Williams MRN: [id number] Date of Birth: [birth year] Referring Provider: Gurney Maxin, MD   Encounter Date: 09/24/2017  PT End of Session - 09/24/17 1702    Visit Number  11    Number of Visits  26    Date for PT Re-Evaluation  11/14/17    PT Start Time  1350    PT Stop Time  1436    PT Time Calculation (min)  46 min    Equipment Utilized During Treatment  Gait belt    Activity Tolerance  Patient tolerated treatment well;Patient limited by fatigue;Other (comment)    Behavior During Therapy  WFL for tasks assessed/performed       Past Medical History:  Diagnosis Date  . Abdominal pain, right upper quadrant   . Back pain   . Calculus of kidney 12/09/2013  . Chronic back pain    unspecified  . Chronic left shoulder pain 07/19/2015  . Functional disorder of bladder    other  . Galactorrhea 11/26/2014   Chronic   . Hereditary and idiopathic neuropathy 08/19/2013  . HPV test positive   . Hypercholesteremia 08/19/2013  . Incomplete bladder emptying   . Microscopic hematuria   . MS (multiple sclerosis) ([place])   . Muscle spasticity 05/21/2014  . Nonspecific findings on examination of urine    other  . Osteopenia   . Status post laparoscopic supracervical hysterectomy 11/26/2014  . Tobacco user 11/26/2014  . Wrist fracture     Past Surgical History:  Procedure Laterality Date  . bilateral tubal ligation  1996  . BREAST CYST EXCISION Left 2002  . KNEE SURGERY     right  . LAPAROSCOPIC SUPRACERVICAL HYSTERECTOMY  08/05/2013  . ORIF WRIST FRACTURE Left 01/17/2017   Procedure: OPEN REDUCTION INTERNAL FIXATION (ORIF) WRIST FRACTURE;  Surgeon: Lovell Sheehan, MD;  Location: [facility] ORS;  Service: Orthopedics;  Laterality: Left;  . TUBAL LIGATION Bilateral   . VAGINAL  HYSTERECTOMY  03/2006    There were no vitals filed for this visit.  Subjective Assessment - 09/24/17 1512    Subjective  Patient reports having a busy weekend, moving a lot. A little fatigued today.     Patient is accompained by:  Family member    Pertinent History  Pt is a 57 y/o F who presents with BLE weakness and difficulty ambulating due to h/o MS that was diagnosed in 1995. Pt denies any pain associated. Pt was admitted to the hospital in November 2018 due to MS exacerbation. Nov-Dec to HHPT-->[facility] until end of Jan. After rehab the pt was able to ambulate up to 100 ft with the RW. Pt has Bil AFO, her therapist at [facility] thinks she needs a new AFO as her feet still drag when she ambulates, pt reports HHPT mentioned a spring AFO. Pt has custom AFOs that were originally made 4 years ago. Goals: improve balance, to be able to get into the shower with her tub bench without assist, to be able to get up 2 steps at sister's home with railings on both sides but too far apart to reach both sides, to be able to drive adapted car. Pt is staying at other sister's house with one step to get inside and currently picking up L leg with UEs and then stepping in. Pt steps  down leading with LLE. Pt wants to work on walking side to side as this is challenging and she had not yet progressed to this at rehab. Pt able to get in/out the car with assist only to bring BLEs into the car. Hoping to drive by June or July with adapted car (already has one). Pt needs assist bringing LEs into and out of hospital bed. Pt able to perform sit>stand without assist from [facility] with a pillow on the seat for a boost. Pt denies any falls in the past 6 months. Pt has been increasing her time alone at home up to a few hours at a time to become more independent. Pt has both manual and power WC. Uses power WC at home, uses manual WC out in community. Pt able to self propel manual WC. Pt needs assist putting pants around ankles but she  can pull them up. Pt can put on her shoes but needs help getting her leg up to do so. Pt able to tie her shoelaces on her own. Pt has a tub/shower unit at home with a tub bench. No grab bars in the shower. Has hand held shower head. Pt currently taking a sponge bath. Pt has a regular height toilet with a BSC over top. Pt with h/o L wrist fx with no precautions, pt wore her soft brace to evaluation for comfort but reports her wrist has healed and is doing well following OT.     Limitations  Lifting;Standing;Walking;House hold activities    How long can you stand comfortably?  3 minutes without UE assist, limited by fatigue    How long can you walk comfortably?  100 ft, limited by fatigue    Patient Stated Goals  see above    Currently in Pain?  No/denies        Stepping forward over tape on floor with emphasis on floor clearance 1x10 each LE with BUE support ; improved clearance of LLE   Side tapping over tape on floor with emphasis on floor clearance 1x10 with each LE and BUE support    Patient's R knee buckled in // bars while performing side stepping while patient was holding on with BUE , with patient wearing gait belt and PT performing CGA holding belt. Patient resulted in half kneeling position on knees in // bars and required use of lift to bring her back to WC. Patient unable to stand fully afterwards due to bilateral LE fatigue. Dr. Melrose Nakayama called and notified of fall due to patient declining going to ER.  Patient reported no pain or tenderness, no dizziness, and no complaints after fall. Fall reported in Safety Portal. Sister and mother aware of fall.  Patient demonstrated ability to clear foot without assistance while wearing heavier shoes.                      PT Education - 09/24/17 1512    Education provided  Yes    Education Details  fall, need to talk to doctor    Person(s) Educated  Patient;Parent(s);Other (comment) sister    Methods  Explanation     Comprehension  Verbalized understanding       PT Short Term Goals - 09/04/17 1323      PT SHORT TERM GOAL #1   Title  Pt will be completed HEP with min assist from caregiver at least 4 days/wk for improved carryover between sessions    Time  2    Period  Weeks  Status  New    Target Date  09/18/17      PT SHORT TERM GOAL #2   Title  Patient will report a worst pain of 6/10 on VAS in cervical spine to improve tolerance with ADLs and reduced symptoms with activities.     Baseline  4/30: 9/10     Time  2    Period  Weeks    Status  New    Target Date  09/18/17      PT SHORT TERM GOAL #3   Title  Patient will perform cervical rotation at least 50 degrees, flexion and extension to 70 degrees, and sidebending to 40 degrees without pain    Baseline  4/30: R 50 L 34 Rotation, R 26 L 35 sb, Extension 35 flex 35     Time  2    Period  Weeks    Status  New    Target Date  09/18/17        PT Long Term Goals - 09/19/17 1110      PT LONG TERM GOAL #1   Title  Pt's 5xSTS will impove to equal to or below 2 minutes to demonstrate improved balance and BLE strength    Baseline  3.02 minutes; 5/15: 1 min 30 seconds    Time  6    Period  Weeks    Status  Achieved      PT LONG TERM GOAL #2   Title  Pt's 4mT will improve to at least 0.5 m/s for improved safety ambulating in home    Baseline  0.14 m/s; 5/15: .228m     Time  8    Period  Weeks    Status  Partially Met      PT LONG TERM GOAL #3   Title  Pt's Bil hip F strength will improve to at least 2+/5 BLEs for improved gait mechanics and safety    Baseline  L: 2/5, R: 1/5    Time  8    Period  Weeks    Status  New      PT LONG TERM GOAL #4   Title  Pt's ABC Scale will improve to at least 40% to demonstrate improved balance    Baseline  28.13% 5/15; 24.4%    Time  6    Period  Weeks    Status  New    Target Date  10/31/17      PT LONG TERM GOAL #5   Title  Pt will be able to perform car transfer without assist to work  towards pt's goal of returning to driving    Baseline  only needs help with one leg to put onto running board to get into sisters car    Time  6    Period  Weeks    Status  Partially Met    Target Date  10/31/17      Additional Long Term Goals   Additional Long Term Goals  Yes      PT LONG TERM GOAL #6   Title  Pt will be able to perform tub bench into/out of shower transfer without assist for improved independence with this task    Baseline  challenged with steps at this time, getting better, stands and gives sponge bath independently now     Time  6    Period  Weeks    Status  Partially Met      PT LONG TERM GOAL #7  Title  Patient will perform cervical rotation at least 60 degrees, flexion and extension to 70 degrees, and sidebending to 40 degrees without pain    Baseline  4/30: R 50 L 34 Rotation, R 26 L 35 sb, Extension 35 flex 35     Time  4    Period  Weeks    Status  New      PT LONG TERM GOAL #8   Title  Patient will improve NDI to <28% to reduce disability to mild  percieved disability for improved quality of life.     Baseline  4/30: 56% severe disability     Time  4    Period  Weeks    Status  New      PT LONG TERM GOAL  #9   TITLE  Patient will report a worst pain of 3/10 on VAS in  cervical spine  to improve tolerance with ADLs and reduced symptoms with activities.     Baseline  4/30: 9/10     Time  4    Period  Weeks    Status  New      PT LONG TERM GOAL  #10   TITLE  Pt's 5xSTS will impove to equal to or below 50 seconds to demonstrate improved balance and BLE strength    Baseline  5/15: 1 min 30.9 seconds    Time  6    Period  Weeks    Status  New    Target Date  10/31/17            Plan - 09/24/17 1703    Clinical Impression Statement  Patient's R knee buckled in // bars while performing side stepping while patient was holding on with BUE , with patient wearing gait belt and PT performing CGA holding belt. Patient resulted in half kneeling  position on knees in // bars and required use of lift to bring her back to WC. Patient unable to stand fully afterwards due to bilateral LE fatigue. Dr. Melrose Nakayama called and notified of fall due to patient declining going to ER.  Patient reported no pain or tenderness, no dizziness, and no complaints after fall. Fall reported in Safety Portal. Sister and mother aware of fall.    Rehab Potential  Good    PT Frequency  2x / week    PT Duration  8 weeks    PT Treatment/Interventions  ADLs/Self Care Home Management;Aquatic Therapy;Biofeedback;Cryotherapy;Electrical Stimulation;Iontophoresis 27m/ml Dexamethasone;Traction;Moist Heat;Ultrasound;DME Instruction;Gait training;Stair training;Functional mobility training;Therapeutic activities;Therapeutic exercise;Balance training;Neuromuscular re-education;Patient/family education;Orthotic Fit/Training;Wheelchair mobility training;Manual techniques;Compression bandaging;Passive range of motion;Dry needling;Energy conservation;Taping;Splinting    PT Next Visit Plan  assess need for new AFOs or other orthotic to assist with ambulation, initiate strengthening program, practice and improve technique for bed mobility and sit<>stand    PT Home Exercise Plan  to initiate next session    Consulted and Agree with Plan of Care  Patient       Patient will benefit from skilled therapeutic intervention in order to improve the following deficits and impairments:  Abnormal gait, Decreased activity tolerance, Decreased balance, Decreased coordination, Decreased endurance, Decreased knowledge of use of DME, Decreased mobility, Decreased range of motion, Decreased safety awareness, Decreased strength, Difficulty walking, Impaired perceived functional ability, Impaired sensation, Impaired tone, Impaired UE functional use, Improper body mechanics, Postural dysfunction  Visit Diagnosis: Other abnormalities of gait and mobility  Unsteadiness on feet  Muscle weakness  (generalized)     Problem List Patient Active Problem List  Diagnosis Date Noted  . Wrist fracture 01/16/2017  . Health care maintenance 01/24/2016  . Blood pressure elevated without history of HTN 10/25/2015  . Multiple sclerosis ([place]) 10/02/2015  . Chronic left shoulder pain 07/19/2015  . Multiple sclerosis exacerbation ([place]) 07/14/2015  . MS (multiple sclerosis) ([place]) 11/26/2014  . Increased body mass index 11/26/2014  . HPV test positive 11/26/2014  . Status post laparoscopic supracervical hysterectomy 11/26/2014  . Galactorrhea 11/26/2014  . Tobacco user 11/26/2014  . Back ache 05/21/2014  . Adiposity 05/21/2014  . Disordered sleep 05/21/2014  . Muscle spasticity 05/21/2014  . Calculus of kidney 12/09/2013  . Renal colic 24/46/9507  . Hypercholesteremia 08/19/2013  . Hereditary and idiopathic neuropathy 08/19/2013  . Hypercholesterolemia without hypertriglyceridemia 08/19/2013  . Bladder infection, chronic 07/25/2012  . Disorder of bladder function 07/25/2012  . Incomplete bladder emptying 07/25/2012  . Microscopic hematuria 07/25/2012   Janna Arch, PT, DPT   09/24/2017, 5:05 PM  [place] MAIN [facility] SERVICES [address] [place], [place], [address] Phone: [phone]   Fax:  [phone]  Name: Lisa Williams MRN: [id number] Date of Birth: 01-31-1961

## 2017-09-25 ENCOUNTER — Ambulatory Visit: Payer: PPO

## 2017-09-25 DIAGNOSIS — M6281 Muscle weakness (generalized): Secondary | ICD-10-CM

## 2017-09-25 DIAGNOSIS — M5412 Radiculopathy, cervical region: Secondary | ICD-10-CM

## 2017-09-25 NOTE — Therapy (Signed)
[place] MAIN [facility] SERVICES [address] [place], [place], [address] Phone: [phone]   Fax:  [phone]  Physical Therapy Treatment  Patient Details  Name: Lisa Williams MRN: [id number] Date of Birth: 02/05/1961 Referring Provider: Gurney Maxin, MD   Encounter Date: 09/25/2017  PT End of Session - 09/25/17 1712    Visit Number  5    Number of Visits  8    Date for PT Re-Evaluation  10/02/17    PT Start Time  1106    PT Stop Time  1150    PT Time Calculation (min)  44 min    Activity Tolerance  Patient tolerated treatment well    Behavior During Therapy  [facility] for tasks assessed/performed       Past Medical History:  Diagnosis Date  . Abdominal pain, right upper quadrant   . Back pain   . Calculus of kidney 12/09/2013  . Chronic back pain    unspecified  . Chronic left shoulder pain 07/19/2015  . Functional disorder of bladder    other  . Galactorrhea 11/26/2014   Chronic   . Hereditary and idiopathic neuropathy 08/19/2013  . HPV test positive   . Hypercholesteremia 08/19/2013  . Incomplete bladder emptying   . Microscopic hematuria   . MS (multiple sclerosis) ([place])   . Muscle spasticity 05/21/2014  . Nonspecific findings on examination of urine    other  . Osteopenia   . Status post laparoscopic supracervical hysterectomy 11/26/2014  . Tobacco user 11/26/2014  . Wrist fracture     Past Surgical History:  Procedure Laterality Date  . bilateral tubal ligation  1996  . BREAST CYST EXCISION Left 2002  . KNEE SURGERY     right  . LAPAROSCOPIC SUPRACERVICAL HYSTERECTOMY  08/05/2013  . ORIF WRIST FRACTURE Left 01/17/2017   Procedure: OPEN REDUCTION INTERNAL FIXATION (ORIF) WRIST FRACTURE;  Surgeon: Lovell Sheehan, MD;  Location: [facility] ORS;  Service: Orthopedics;  Laterality: Left;  . TUBAL LIGATION Bilateral   . VAGINAL HYSTERECTOMY  03/2006    There were no vitals filed for this visit.  Subjective Assessment -  09/25/17 1711    Subjective  Patient reports no pain from fall yesterday. Feeling fine today. Neck no longer bothers patient.     Patient is accompained by:  Family member    Pertinent History  Pt is a 57 y/o F who presents with BLE weakness and difficulty ambulating due to h/o MS that was diagnosed in 1995. Pt denies any pain associated. Pt was admitted to the hospital in November 2018 due to MS exacerbation. Nov-Dec to HHPT-->[facility] until end of Jan. After rehab the pt was able to ambulate up to 100 ft with the RW. Pt has Bil AFO, her therapist at [facility] thinks she needs a new AFO as her feet still drag when she ambulates, pt reports HHPT mentioned a spring AFO. Pt has custom AFOs that were originally made 4 years ago. Goals: improve balance, to be able to get into the shower with her tub bench without assist, to be able to get up 2 steps at sister's home with railings on both sides but too far apart to reach both sides, to be able to drive adapted car. Pt is staying at other sister's house with one step to get inside and currently picking up L leg with UEs and then stepping in. Pt steps down leading with LLE. Pt wants to work on walking side to  side as this is challenging and she had not yet progressed to this at rehab. Pt able to get in/out the car with assist only to bring BLEs into the car. Hoping to drive by June or July with adapted car (already has one). Pt needs assist bringing LEs into and out of hospital bed. Pt able to perform sit>stand without assist from [facility] with a pillow on the seat for a boost. Pt denies any falls in the past 6 months. Pt has been increasing her time alone at home up to a few hours at a time to become more independent. Pt has both manual and power WC. Uses power WC at home, uses manual WC out in community. Pt able to self propel manual WC. Pt needs assist putting pants around ankles but she can pull them up. Pt can put on her shoes but needs help getting her leg up to  do so. Pt able to tie her shoelaces on her own. Pt has a tub/shower unit at home with a tub bench. No grab bars in the shower. Has hand held shower head. Pt currently taking a sponge bath. Pt has a regular height toilet with a BSC over top. Pt with h/o L wrist fx with no precautions, pt wore her soft brace to evaluation for comfort but reports her wrist has healed and is doing well following OT.     Limitations  Lifting;Standing;Walking;House hold activities    How long can you stand comfortably?  3 minutes without UE assist, limited by fatigue    How long can you walk comfortably?  100 ft, limited by fatigue    Patient Stated Goals  see above    Currently in Pain?  No/denies        Cervical ROM    Right Left  Flexion 35  Extension 46  Side Bending 41 40  Rotation 62 61     VAS : no pain NDI: 10%  Radial nerve glides LUE 20x   Suboccipital release 5x 20 seconds    Sidebending with overpressure stretch 5x 20 seconds with L more tight than R   Rotation L and R with overpressure stretch 5x20 seconds   Cervical UPA and CPA glides in supine position  4x10 seconds each segment   First rib mobilization Lside 3x10 seconds  Seated: STM and trigger point upper trap and cervical musculature with focus on levator scapulae.     STM to subscap in supine positions 5 minutes   Pt. response to medical necessity:  Patient will continue to benefit from skilled physical therapy to address cervical pain and limited ROM in addition to continuing current POC for MS.                         PT Education - 09/25/17 1711    Education provided  Yes    Education Details  end of radiculopathy treatment , POC    Person(s) Educated  Patient;Caregiver(s)    Methods  Explanation    Comprehension  Verbalized understanding       PT Short Term Goals - 09/25/17 1714      PT SHORT TERM GOAL #1   Title  Pt will be completed HEP with min assist from caregiver at least 4 days/wk  for improved carryover between sessions    Time  2    Period  Weeks    Status  New      PT SHORT TERM GOAL #2  Title  Patient will report a worst pain of 6/10 on VAS in cervical spine to improve tolerance with ADLs and reduced symptoms with activities.     Baseline  4/30: 9/10 5/21: 0/10 pain    Time  2    Period  Weeks    Status  Achieved      PT SHORT TERM GOAL #3   Title  Patient will perform cervical rotation at least 50 degrees, flexion and extension to 70 degrees, and sidebending to 40 degrees without pain    Baseline  4/30: R 50 L 34 Rotation, R 26 L 35 sb, Extension 35 flex 35 ; 5/21: extension 46, SB R 41 L 40, rotation R 62 L 61,     Time  2    Period  Weeks    Status  Achieved        PT Long Term Goals - 09/25/17 1716      PT LONG TERM GOAL #1   Title  Pt's 5xSTS will impove to equal to or below 2 minutes to demonstrate improved balance and BLE strength    Baseline  3.02 minutes; 5/15: 1 min 30 seconds    Time  6    Period  Weeks    Status  Achieved      PT LONG TERM GOAL #2   Title  Pt's 38mT will improve to at least 0.5 m/s for improved safety ambulating in home    Baseline  0.14 m/s; 5/15: .25m     Time  8    Period  Weeks    Status  Partially Met      PT LONG TERM GOAL #3   Title  Pt's Bil hip F strength will improve to at least 2+/5 BLEs for improved gait mechanics and safety    Baseline  L: 2/5, R: 1/5    Time  8    Period  Weeks    Status  New      PT LONG TERM GOAL #4   Title  Pt's ABC Scale will improve to at least 40% to demonstrate improved balance    Baseline  28.13% 5/15; 24.4%    Time  6    Period  Weeks    Status  New      PT LONG TERM GOAL #5   Title  Pt will be able to perform car transfer without assist to work towards pt's goal of returning to driving    Baseline  only needs help with one leg to put onto running board to get into sisters car    Time  6    Period  Weeks    Status  Partially Met      PT LONG TERM GOAL #6    Title  Pt will be able to perform tub bench into/out of shower transfer without assist for improved independence with this task    Baseline  challenged with steps at this time, getting better, stands and gives sponge bath independently now     Time  6    Period  Weeks    Status  Partially Met      PT LONG TERM GOAL #7   Title  Patient will perform cervical rotation at least 60 degrees, flexion and extension to 70 degrees, and sidebending to 40 degrees without pain    Baseline  4/30: R 50 L 34 Rotation, R 26 L 35 sb, Extension 35 flex 35; 5/21: extension 46, SB R 41 L  40, rotation R 62 L 61,     Time  4    Period  Weeks    Status  Achieved      PT LONG TERM GOAL #8   Title  Patient will improve NDI to <28% to reduce disability to mild  percieved disability for improved quality of life.     Baseline  4/30: 56% severe disability 5/21: 10%    Time  4    Period  Weeks    Status  Achieved      PT LONG TERM GOAL  #9   TITLE  Patient will report a worst pain of 3/10 on VAS in  cervical spine  to improve tolerance with ADLs and reduced symptoms with activities.     Baseline  4/30: 9/10 5/21: 0/10 in neck     Time  4    Period  Weeks    Status  Achieved      PT LONG TERM GOAL  #10   TITLE  Pt's 5xSTS will impove to equal to or below 50 seconds to demonstrate improved balance and BLE strength    Baseline  5/15: 1 min 30.9 seconds    Time  6    Period  Weeks    Status  New            Plan - 09/25/17 1714    Clinical Impression Statement  Patient met goals for cervical radiculopathy, with discontinue radiculopathy POC of physical therapy and focus on MS at this time. NDI=10%, ROM WFL, and pain is no longer interfering with daily life. Will re-start MS treatment next session.     Rehab Potential  Good    PT Frequency  2x / week    PT Duration  4 weeks    PT Treatment/Interventions  ADLs/Self Care Home Management;Aquatic Therapy;Biofeedback;Cryotherapy;Electrical  Stimulation;Iontophoresis '4mg'$ /ml Dexamethasone;Traction;Moist Heat;Ultrasound;DME Instruction;Gait training;Stair training;Functional mobility training;Therapeutic activities;Therapeutic exercise;Balance training;Neuromuscular re-education;Patient/family education;Orthotic Fit/Training;Wheelchair mobility training;Manual techniques;Compression bandaging;Passive range of motion;Dry needling;Energy conservation;Taping;Splinting    PT Home Exercise Plan  see sheet    Consulted and Agree with Plan of Care  Patient       Patient will benefit from skilled therapeutic intervention in order to improve the following deficits and impairments:  Abnormal gait, Decreased activity tolerance, Decreased balance, Decreased coordination, Decreased endurance, Decreased knowledge of use of DME, Decreased mobility, Decreased range of motion, Decreased safety awareness, Decreased strength, Difficulty walking, Impaired perceived functional ability, Impaired sensation, Impaired tone, Impaired UE functional use, Improper body mechanics, Postural dysfunction, Hypomobility, Impaired flexibility, Pain  Visit Diagnosis: Muscle weakness (generalized)  Radiculopathy, cervical region     Problem List Patient Active Problem List   Diagnosis Date Noted  . Wrist fracture 01/16/2017  . Health care maintenance 01/24/2016  . Blood pressure elevated without history of HTN 10/25/2015  . Multiple sclerosis ([place]) 10/02/2015  . Chronic left shoulder pain 07/19/2015  . Multiple sclerosis exacerbation ([place]) 07/14/2015  . MS (multiple sclerosis) ([place]) 11/26/2014  . Increased body mass index 11/26/2014  . HPV test positive 11/26/2014  . Status post laparoscopic supracervical hysterectomy 11/26/2014  . Galactorrhea 11/26/2014  . Tobacco user 11/26/2014  . Back ache 05/21/2014  . Adiposity 05/21/2014  . Disordered sleep 05/21/2014  . Muscle spasticity 05/21/2014  . Calculus of kidney 12/09/2013  . Renal colic 74/82/7078  .  Hypercholesteremia 08/19/2013  . Hereditary and idiopathic neuropathy 08/19/2013  . Hypercholesterolemia without hypertriglyceridemia 08/19/2013  . Bladder infection, chronic 07/25/2012  . Disorder of bladder function  07/25/2012  . Incomplete bladder emptying 07/25/2012  . Microscopic hematuria 07/25/2012   Janna Arch, PT, DPT   09/25/2017, 5:17 PM  [place] MAIN [facility] SERVICES [address] [place], [place], [address] Phone: [phone]   Fax:  [phone]  Name: Lisa Williams MRN: [id number] Date of Birth: 1960-05-24

## 2017-09-26 DIAGNOSIS — G35 Multiple sclerosis: Secondary | ICD-10-CM | POA: Diagnosis not present

## 2017-09-27 ENCOUNTER — Ambulatory Visit: Payer: PPO

## 2017-09-27 DIAGNOSIS — M6281 Muscle weakness (generalized): Secondary | ICD-10-CM

## 2017-09-27 DIAGNOSIS — R2681 Unsteadiness on feet: Secondary | ICD-10-CM

## 2017-09-27 DIAGNOSIS — M5412 Radiculopathy, cervical region: Secondary | ICD-10-CM | POA: Diagnosis not present

## 2017-09-27 DIAGNOSIS — R2689 Other abnormalities of gait and mobility: Secondary | ICD-10-CM

## 2017-09-27 NOTE — Therapy (Signed)
[place] MAIN [facility] SERVICES [address] [place], [place], [address] Phone: [phone]   Fax:  [phone]  Physical Therapy Treatment  Patient Details  Name: Lisa Williams MRN: [id number] Date of Birth: 05-06-1961 Referring Provider: Gurney Maxin, MD   Encounter Date: 09/27/2017  PT End of Session - 09/27/17 1444    Visit Number  12    Number of Visits  26    Date for PT Re-Evaluation  11/14/17    PT Start Time  1115    PT Stop Time  1201    PT Time Calculation (min)  46 min    Equipment Utilized During Treatment  Gait belt    Activity Tolerance  Patient tolerated treatment well;Patient limited by fatigue;Other (comment)    Behavior During Therapy  WFL for tasks assessed/performed       Past Medical History:  Diagnosis Date  . Abdominal pain, right upper quadrant   . Back pain   . Calculus of kidney 12/09/2013  . Chronic back pain    unspecified  . Chronic left shoulder pain 07/19/2015  . Functional disorder of bladder    other  . Galactorrhea 11/26/2014   Chronic   . Hereditary and idiopathic neuropathy 08/19/2013  . HPV test positive   . Hypercholesteremia 08/19/2013  . Incomplete bladder emptying   . Microscopic hematuria   . MS (multiple sclerosis) ([place])   . Muscle spasticity 05/21/2014  . Nonspecific findings on examination of urine    other  . Osteopenia   . Status post laparoscopic supracervical hysterectomy 11/26/2014  . Tobacco user 11/26/2014  . Wrist fracture     Past Surgical History:  Procedure Laterality Date  . bilateral tubal ligation  1996  . BREAST CYST EXCISION Left 2002  . KNEE SURGERY     right  . LAPAROSCOPIC SUPRACERVICAL HYSTERECTOMY  08/05/2013  . ORIF WRIST FRACTURE Left 01/17/2017   Procedure: OPEN REDUCTION INTERNAL FIXATION (ORIF) WRIST FRACTURE;  Surgeon: Lovell Sheehan, MD;  Location: [facility] ORS;  Service: Orthopedics;  Laterality: Left;  . TUBAL LIGATION Bilateral   . VAGINAL  HYSTERECTOMY  03/2006    There were no vitals filed for this visit.  Subjective Assessment - 09/27/17 1442    Subjective  Patient and sister report that she has been doing well. Reports compliance with HEp, no pain     Patient is accompained by:  Family member    Pertinent History  Pt is a 57 y/o F who presents with BLE weakness and difficulty ambulating due to h/o MS that was diagnosed in 1995. Pt denies any pain associated. Pt was admitted to the hospital in November 2018 due to MS exacerbation. Nov-Dec to HHPT-->[facility] until end of Jan. After rehab the pt was able to ambulate up to 100 ft with the RW. Pt has Bil AFO, her therapist at [facility] thinks she needs a new AFO as her feet still drag when she ambulates, pt reports HHPT mentioned a spring AFO. Pt has custom AFOs that were originally made 4 years ago. Goals: improve balance, to be able to get into the shower with her tub bench without assist, to be able to get up 2 steps at sister's home with railings on both sides but too far apart to reach both sides, to be able to drive adapted car. Pt is staying at other sister's house with one step to get inside and currently picking up L leg with UEs and then stepping  in. Pt steps down leading with LLE. Pt wants to work on walking side to side as this is challenging and she had not yet progressed to this at rehab. Pt able to get in/out the car with assist only to bring BLEs into the car. Hoping to drive by June or July with adapted car (already has one). Pt needs assist bringing LEs into and out of hospital bed. Pt able to perform sit>stand without assist from [facility] with a pillow on the seat for a boost. Pt denies any falls in the past 6 months. Pt has been increasing her time alone at home up to a few hours at a time to become more independent. Pt has both manual and power WC. Uses power WC at home, uses manual WC out in community. Pt able to self propel manual WC. Pt needs assist putting pants around  ankles but she can pull them up. Pt can put on her shoes but needs help getting her leg up to do so. Pt able to tie her shoelaces on her own. Pt has a tub/shower unit at home with a tub bench. No grab bars in the shower. Has hand held shower head. Pt currently taking a sponge bath. Pt has a regular height toilet with a BSC over top. Pt with h/o L wrist fx with no precautions, pt wore her soft brace to evaluation for comfort but reports her wrist has healed and is doing well following OT.     Limitations  Lifting;Standing;Walking;House hold activities    How long can you stand comfortably?  3 minutes without UE assist, limited by fatigue    How long can you walk comfortably?  100 ft, limited by fatigue    Patient Stated Goals  see above    Currently in Pain?  No/denies                 Ice pack on abdomen during resting breaks   All standing interventions performed with CGA  Blue TB bilateral hip flexion assist   Stepping forward and backwards with emphasis on floor clearance 1x10 each LE with BUE support ; improved clearance of LLE   Marching in place with BUE support 10x each side, x 2 trials each side.     Side tapping over tape on floor with emphasis on floor clearance 1x10 with each LE and BUE support     Static stand with UE support and horizontal head turns 2 minutes no LOB  Sit to stand from elevated seat with UE assist on // bars x10   Seated: adduction isometric 2x10 with 5 second holds BLE (single leg at a time);   Seated: abduction isometric 2x10 with 5 second hold BLE (single leg at a time((                           PT Education - 09/27/17 1443    Education provided  Yes    Education Details  use of ice to cool internal body temperature, body mechanics    Person(s) Educated  Patient    Methods  Explanation;Demonstration;Verbal cues    Comprehension  Verbalized understanding;Returned demonstration       PT Short Term Goals - 09/25/17  1714      PT SHORT TERM GOAL #1   Title  Pt will be completed HEP with min assist from caregiver at least 4 days/wk for improved carryover between sessions    Time  2  Period  Weeks    Status  New      PT SHORT TERM GOAL #2   Title  Patient will report a worst pain of 6/10 on VAS in cervical spine to improve tolerance with ADLs and reduced symptoms with activities.     Baseline  4/30: 9/10 5/21: 0/10 pain    Time  2    Period  Weeks    Status  Achieved      PT SHORT TERM GOAL #3   Title  Patient will perform cervical rotation at least 50 degrees, flexion and extension to 70 degrees, and sidebending to 40 degrees without pain    Baseline  4/30: R 50 L 34 Rotation, R 26 L 35 sb, Extension 35 flex 35 ; 5/21: extension 46, SB R 41 L 40, rotation R 62 L 61,     Time  2    Period  Weeks    Status  Achieved        PT Long Term Goals - 09/25/17 1716      PT LONG TERM GOAL #1   Title  Pt's 5xSTS will impove to equal to or below 2 minutes to demonstrate improved balance and BLE strength    Baseline  3.02 minutes; 5/15: 1 min 30 seconds    Time  6    Period  Weeks    Status  Achieved      PT LONG TERM GOAL #2   Title  Pt's 72mT will improve to at least 0.5 m/s for improved safety ambulating in home    Baseline  0.14 m/s; 5/15: .215m     Time  8    Period  Weeks    Status  Partially Met      PT LONG TERM GOAL #3   Title  Pt's Bil hip F strength will improve to at least 2+/5 BLEs for improved gait mechanics and safety    Baseline  L: 2/5, R: 1/5    Time  8    Period  Weeks    Status  New      PT LONG TERM GOAL #4   Title  Pt's ABC Scale will improve to at least 40% to demonstrate improved balance    Baseline  28.13% 5/15; 24.4%    Time  6    Period  Weeks    Status  New      PT LONG TERM GOAL #5   Title  Pt will be able to perform car transfer without assist to work towards pt's goal of returning to driving    Baseline  only needs help with one leg to put onto running  board to get into sisters car    Time  6    Period  Weeks    Status  Partially Met      PT LONG TERM GOAL #6   Title  Pt will be able to perform tub bench into/out of shower transfer without assist for improved independence with this task    Baseline  challenged with steps at this time, getting better, stands and gives sponge bath independently now     Time  6    Period  Weeks    Status  Partially Met      PT LONG TERM GOAL #7   Title  Patient will perform cervical rotation at least 60 degrees, flexion and extension to 70 degrees, and sidebending to 40 degrees without pain    Baseline  4/30:  R 50 L 34 Rotation, R 26 L 35 sb, Extension 35 flex 35; 5/21: extension 46, SB R 41 L 40, rotation R 62 L 61,     Time  4    Period  Weeks    Status  Achieved      PT LONG TERM GOAL #8   Title  Patient will improve NDI to <28% to reduce disability to mild  percieved disability for improved quality of life.     Baseline  4/30: 56% severe disability 5/21: 10%    Time  4    Period  Weeks    Status  Achieved      PT LONG TERM GOAL  #9   TITLE  Patient will report a worst pain of 3/10 on VAS in  cervical spine  to improve tolerance with ADLs and reduced symptoms with activities.     Baseline  4/30: 9/10 5/21: 0/10 in neck     Time  4    Period  Weeks    Status  Achieved      PT LONG TERM GOAL  #10   TITLE  Pt's 5xSTS will impove to equal to or below 50 seconds to demonstrate improved balance and BLE strength    Baseline  5/15: 1 min 30.9 seconds    Time  6    Period  Weeks    Status  New            Plan - 09/27/17 1444    Clinical Impression Statement  Use of ice pack on abdomen and/or shoulder helped reduce patient's overheating allowing for increased muscle performance and prolonged capability of standing due to patient originally being limited by heat due to MS diagnosis. Patient demonstrated improved hip/knee flexion with standing marches with increased clearance of foot  bilaterally. Patient will continue to benefit from skilled PT to improve mobility and independence.     Rehab Potential  Good    PT Frequency  2x / week    PT Duration  8 weeks    PT Treatment/Interventions  ADLs/Self Care Home Management;Aquatic Therapy;Biofeedback;Cryotherapy;Electrical Stimulation;Iontophoresis 71m/ml Dexamethasone;Traction;Moist Heat;Ultrasound;DME Instruction;Gait training;Stair training;Functional mobility training;Therapeutic activities;Therapeutic exercise;Balance training;Neuromuscular re-education;Patient/family education;Orthotic Fit/Training;Wheelchair mobility training;Manual techniques;Compression bandaging;Passive range of motion;Dry needling;Energy conservation;Taping;Splinting    PT Next Visit Plan  assess need for new AFOs or other orthotic to assist with ambulation, initiate strengthening program, practice and improve technique for bed mobility and sit<>stand    PT Home Exercise Plan  to initiate next session    Consulted and Agree with Plan of Care  Patient       Patient will benefit from skilled therapeutic intervention in order to improve the following deficits and impairments:  Abnormal gait, Decreased activity tolerance, Decreased balance, Decreased coordination, Decreased endurance, Decreased knowledge of use of DME, Decreased mobility, Decreased range of motion, Decreased safety awareness, Decreased strength, Difficulty walking, Impaired perceived functional ability, Impaired sensation, Impaired tone, Impaired UE functional use, Improper body mechanics, Postural dysfunction  Visit Diagnosis: Muscle weakness (generalized)  Other abnormalities of gait and mobility  Unsteadiness on feet     Problem List Patient Active Problem List   Diagnosis Date Noted  . Wrist fracture 01/16/2017  . Health care maintenance 01/24/2016  . Blood pressure elevated without history of HTN 10/25/2015  . Multiple sclerosis ([place] 10/02/2015  . Chronic left shoulder pain  07/19/2015  . [place] 07/[place]  . MS ([place]) ([place] 11/26/2014  . Increased body mass index 11/26/2014  .  HPV test positive 11/26/2014  . Status post laparoscopic supracervical hysterectomy 11/26/2014  . Galactorrhea 11/26/2014  . Tobacco user 11/26/2014  . Back ache 05/21/2014  . Adiposity 05/21/2014  . Disordered sleep 05/21/2014  . Muscle spasticity 05/21/2014  . Calculus of kidney 12/09/2013  . Renal colic 62/22/9798  . Hypercholesteremia 08/19/2013  . Hereditary and idiopathic neuropathy 08/19/2013  . Hypercholesterolemia without hypertriglyceridemia 08/19/2013  . Bladder infection, chronic 07/25/2012  . Disorder of bladder function 07/25/2012  . Incomplete bladder emptying 07/25/2012  . Microscopic hematuria 07/25/2012   Janna Arch, PT, DPT   09/27/2017, 2:45 PM  [place] MAIN [facility] SERVICES [address] [place], [place], [address] Phone: [phone]   Fax:  [phone]  Name: Lisa Williams MRN: [id number] Date of Birth: Jan 15, 1961

## 2017-10-03 ENCOUNTER — Ambulatory Visit: Payer: PPO

## 2017-10-03 DIAGNOSIS — R2681 Unsteadiness on feet: Secondary | ICD-10-CM

## 2017-10-03 DIAGNOSIS — R2689 Other abnormalities of gait and mobility: Secondary | ICD-10-CM

## 2017-10-03 DIAGNOSIS — M6281 Muscle weakness (generalized): Secondary | ICD-10-CM

## 2017-10-03 DIAGNOSIS — M5412 Radiculopathy, cervical region: Secondary | ICD-10-CM | POA: Diagnosis not present

## 2017-10-03 NOTE — Therapy (Signed)
[place] MAIN [facility] SERVICES [address] [place], [place], [address] Phone: [phone]   Fax:  [phone]  Physical Therapy Treatment  Patient Details  Name: Lisa Williams MRN: [id number] Date of Birth: [birth year] Referring Provider: Gurney Maxin, MD   Encounter Date: 10/03/2017  PT End of Session - 10/03/17 1652    Visit Number  13    Number of Visits  26    Date for PT Re-Evaluation  11/14/17    PT Start Time  7588    PT Stop Time  1601    PT Time Calculation (min)  46 min    Equipment Utilized During Treatment  Gait belt    Activity Tolerance  Patient tolerated treatment well;Patient limited by fatigue;Other (comment)    Behavior During Therapy  WFL for tasks assessed/performed       Past Medical History:  Diagnosis Date  . Abdominal pain, right upper quadrant   . Back pain   . Calculus of kidney 12/09/2013  . Chronic back pain    unspecified  . Chronic left shoulder pain 07/19/2015  . Functional disorder of bladder    other  . Galactorrhea 11/26/2014   Chronic   . Hereditary and idiopathic neuropathy 08/19/2013  . HPV test positive   . Hypercholesteremia 08/19/2013  . Incomplete bladder emptying   . Microscopic hematuria   . MS (multiple sclerosis) ([place])   . Muscle spasticity 05/21/2014  . Nonspecific findings on examination of urine    other  . Osteopenia   . Status post laparoscopic supracervical hysterectomy 11/26/2014  . Tobacco user 11/26/2014  . Wrist fracture     Past Surgical History:  Procedure Laterality Date  . bilateral tubal ligation  1996  . BREAST CYST EXCISION Left 2002  . KNEE SURGERY     right  . LAPAROSCOPIC SUPRACERVICAL HYSTERECTOMY  08/05/2013  . ORIF WRIST FRACTURE Left 01/17/2017   Procedure: OPEN REDUCTION INTERNAL FIXATION (ORIF) WRIST FRACTURE;  Surgeon: Lovell Sheehan, MD;  Location: [facility] ORS;  Service: Orthopedics;  Laterality: Left;  . TUBAL LIGATION Bilateral   . VAGINAL  HYSTERECTOMY  03/2006    There were no vitals filed for this visit.  Subjective Assessment - 10/03/17 1651    Subjective  Patient presents with other sister to session today. Reports she is goign to a funeral this week of a dear friend. Has been compliant with HEP.     Patient is accompained by:  Family member    Pertinent History  Pt is a 57 y/o F who presents with BLE weakness and difficulty ambulating due to h/o MS that was diagnosed in 1995. Pt denies any pain associated. Pt was admitted to the hospital in November 2018 due to MS exacerbation. Nov-Dec to HHPT-->[facility] until end of Jan. After rehab the pt was able to ambulate up to 100 ft with the RW. Pt has Bil AFO, her therapist at [facility] thinks she needs a new AFO as her feet still drag when she ambulates, pt reports HHPT mentioned a spring AFO. Pt has custom AFOs that were originally made 4 years ago. Goals: improve balance, to be able to get into the shower with her tub bench without assist, to be able to get up 2 steps at sister's home with railings on both sides but too far apart to reach both sides, to be able to drive adapted car. Pt is staying at other sister's house with one step to get inside and  currently picking up L leg with UEs and then stepping in. Pt steps down leading with LLE. Pt wants to work on walking side to side as this is challenging and she had not yet progressed to this at rehab. Pt able to get in/out the car with assist only to bring BLEs into the car. Hoping to drive by June or July with adapted car (already has one). Pt needs assist bringing LEs into and out of hospital bed. Pt able to perform sit>stand without assist from [facility] with a pillow on the seat for a boost. Pt denies any falls in the past 6 months. Pt has been increasing her time alone at home up to a few hours at a time to become more independent. Pt has both manual and power WC. Uses power WC at home, uses manual WC out in community. Pt able to self  propel manual WC. Pt needs assist putting pants around ankles but she can pull them up. Pt can put on her shoes but needs help getting her leg up to do so. Pt able to tie her shoelaces on her own. Pt has a tub/shower unit at home with a tub bench. No grab bars in the shower. Has hand held shower head. Pt currently taking a sponge bath. Pt has a regular height toilet with a BSC over top. Pt with h/o L wrist fx with no precautions, pt wore her soft brace to evaluation for comfort but reports her wrist has healed and is doing well following OT.     Limitations  Lifting;Standing;Walking;House hold activities    How long can you stand comfortably?  3 minutes without UE assist, limited by fatigue    How long can you walk comfortably?  100 ft, limited by fatigue    Patient Stated Goals  see above    Currently in Pain?  No/denies       Ice pack on abdomen during resting breaks    All standing interventions performed with CGA   Blue TB bilateral hip flexion assist                       Marching in place with BUE support 10x each side, x 2 trials each side.               Static stand with UE support and horizontal head turns 2 minutes no LOB   Sit to stand from elevated seat with UE assist on // bars x10   Seated: adduction isometric 2x10 with 5 second holds BLE (single leg at a time);    Seated: abduction isometric 2x10 with 5 second hold BLE (single leg at a time((    Seated: hip extension isometric 2x10 with 5 second holds  Ambulation in // bars 10' x10 lengths, theraband utilized on BLE to assist DF, knee flexion, and hip flexion. Cues to avoid trunk flexion and to initiate step by moving pelvis forward.    Tic tac toe balance with SUE support x2 trials         Pt. response to medical necessity:   Patient will continue to benefit from skilled PT to improve mobility and independence.                     PT Education - 10/03/17 1651    Education provided  Yes     Education Details  use of ice with exercise, body mechanics    Person(s) Educated  Patient;Other (comment)  sister    Methods  Explanation;Demonstration;Verbal cues    Comprehension  Returned demonstration;Need further instruction;Verbalized understanding       PT Short Term Goals - 09/25/17 1714      PT SHORT TERM GOAL #1   Title  Pt will be completed HEP with min assist from caregiver at least 4 days/wk for improved carryover between sessions    Time  2    Period  Weeks    Status  New      PT SHORT TERM GOAL #2   Title  Patient will report a worst pain of 6/10 on VAS in cervical spine to improve tolerance with ADLs and reduced symptoms with activities.     Baseline  4/30: 9/10 5/21: 0/10 pain    Time  2    Period  Weeks    Status  Achieved      PT SHORT TERM GOAL #3   Title  Patient will perform cervical rotation at least 50 degrees, flexion and extension to 70 degrees, and sidebending to 40 degrees without pain    Baseline  4/30: R 50 L 34 Rotation, R 26 L 35 sb, Extension 35 flex 35 ; 5/21: extension 46, SB R 41 L 40, rotation R 62 L 61,     Time  2    Period  Weeks    Status  Achieved        PT Long Term Goals - 09/25/17 1716      PT LONG TERM GOAL #1   Title  Pt's 5xSTS will impove to equal to or below 2 minutes to demonstrate improved balance and BLE strength    Baseline  3.02 minutes; 5/15: 1 min 30 seconds    Time  6    Period  Weeks    Status  Achieved      PT LONG TERM GOAL #2   Title  Pt's 87mT will improve to at least 0.5 m/s for improved safety ambulating in home    Baseline  0.14 m/s; 5/15: .250m     Time  8    Period  Weeks    Status  Partially Met      PT LONG TERM GOAL #3   Title  Pt's Bil hip F strength will improve to at least 2+/5 BLEs for improved gait mechanics and safety    Baseline  L: 2/5, R: 1/5    Time  8    Period  Weeks    Status  New      PT LONG TERM GOAL #4   Title  Pt's ABC Scale will improve to at least 40% to demonstrate  improved balance    Baseline  28.13% 5/15; 24.4%    Time  6    Period  Weeks    Status  New      PT LONG TERM GOAL #5   Title  Pt will be able to perform car transfer without assist to work towards pt's goal of returning to driving    Baseline  only needs help with one leg to put onto running board to get into sisters car    Time  6    Period  Weeks    Status  Partially Met      PT LONG TERM GOAL #6   Title  Pt will be able to perform tub bench into/out of shower transfer without assist for improved independence with this task    Baseline  challenged with steps at  this time, getting better, stands and gives sponge bath independently now     Time  6    Period  Weeks    Status  Partially Met      PT LONG TERM GOAL #7   Title  Patient will perform cervical rotation at least 60 degrees, flexion and extension to 70 degrees, and sidebending to 40 degrees without pain    Baseline  4/30: R 50 L 34 Rotation, R 26 L 35 sb, Extension 35 flex 35; 5/21: extension 46, SB R 41 L 40, rotation R 62 L 61,     Time  4    Period  Weeks    Status  Achieved      PT LONG TERM GOAL #8   Title  Patient will improve NDI to <28% to reduce disability to mild  percieved disability for improved quality of life.     Baseline  4/30: 56% severe disability 5/21: 10%    Time  4    Period  Weeks    Status  Achieved      PT LONG TERM GOAL  #9   TITLE  Patient will report a worst pain of 3/10 on VAS in  cervical spine  to improve tolerance with ADLs and reduced symptoms with activities.     Baseline  4/30: 9/10 5/21: 0/10 in neck     Time  4    Period  Weeks    Status  Achieved      PT LONG TERM GOAL  #10   TITLE  Pt's 5xSTS will impove to equal to or below 50 seconds to demonstrate improved balance and BLE strength    Baseline  5/15: 1 min 30.9 seconds    Time  6    Period  Weeks    Status  New            Plan - 10/03/17 1802    Clinical Impression Statement   Patient fatigued quicker today and  overheated more rapidly due to the later session time and excessive external temperature resulting in increase seated rest breaks and ultization of ice pack on abdomen. Use of BTB hip flexion bands allows for improved foot clearance resulting in improved hip flexion and step length. Patient will continue to benefit from skilled PT to improve mobility and independence.     Rehab Potential  Good    PT Frequency  2x / week    PT Duration  8 weeks    PT Treatment/Interventions  ADLs/Self Care Home Management;Aquatic Therapy;Biofeedback;Cryotherapy;Electrical Stimulation;Iontophoresis 51m/ml Dexamethasone;Traction;Moist Heat;Ultrasound;DME Instruction;Gait training;Stair training;Functional mobility training;Therapeutic activities;Therapeutic exercise;Balance training;Neuromuscular re-education;Patient/family education;Orthotic Fit/Training;Wheelchair mobility training;Manual techniques;Compression bandaging;Passive range of motion;Dry needling;Energy conservation;Taping;Splinting    PT Next Visit Plan  assess need for new AFOs or other orthotic to assist with ambulation, initiate strengthening program, practice and improve technique for bed mobility and sit<>stand    PT Home Exercise Plan  to initiate next session    Consulted and Agree with Plan of Care  Patient       Patient will benefit from skilled therapeutic intervention in order to improve the following deficits and impairments:  Abnormal gait, Decreased activity tolerance, Decreased balance, Decreased coordination, Decreased endurance, Decreased knowledge of use of DME, Decreased mobility, Decreased range of motion, Decreased safety awareness, Decreased strength, Difficulty walking, Impaired perceived functional ability, Impaired sensation, Impaired tone, Impaired UE functional use, Improper body mechanics, Postural dysfunction  Visit Diagnosis: Muscle weakness (generalized)  Other abnormalities of gait and mobility  Unsteadiness on  feet     Problem List Patient Active Problem List   Diagnosis Date Noted  . Wrist fracture 01/16/2017  . Health care maintenance 01/24/2016  . Blood pressure elevated without history of HTN 10/25/2015  . Multiple sclerosis ([place]) 10/02/2015  . Chronic left shoulder pain 07/19/2015  . Multiple sclerosis exacerbation ([place]) 07/14/2015  . MS (multiple sclerosis) ([place]) 11/26/2014  . Increased body mass index 11/26/2014  . HPV test positive 11/26/2014  . Status post laparoscopic supracervical hysterectomy 11/26/2014  . Galactorrhea 11/26/2014  . Tobacco user 11/26/2014  . Back ache 05/21/2014  . Adiposity 05/21/2014  . Disordered sleep 05/21/2014  . Muscle spasticity 05/21/2014  . Calculus of kidney 12/09/2013  . Renal colic 41/32/4401  . Hypercholesteremia 08/19/2013  . Hereditary and idiopathic neuropathy 08/19/2013  . Hypercholesterolemia without hypertriglyceridemia 08/19/2013  . Bladder infection, chronic 07/25/2012  . Disorder of bladder function 07/25/2012  . Incomplete bladder emptying 07/25/2012  . Microscopic hematuria 07/25/2012   Janna Arch, PT, DPT   10/03/2017, 6:03 PM  [place] MAIN [facility] SERVICES [address] [place], [place], [address] Phone: [phone]   Fax:  [phone]  Name: Lisa Williams MRN: [id number] Date of Birth: Jun 29, 1960

## 2017-10-08 ENCOUNTER — Ambulatory Visit: Payer: PPO | Attending: Neurology

## 2017-10-08 DIAGNOSIS — G8929 Other chronic pain: Secondary | ICD-10-CM | POA: Diagnosis not present

## 2017-10-08 DIAGNOSIS — R252 Cramp and spasm: Secondary | ICD-10-CM | POA: Diagnosis not present

## 2017-10-08 DIAGNOSIS — M5412 Radiculopathy, cervical region: Secondary | ICD-10-CM | POA: Diagnosis not present

## 2017-10-08 DIAGNOSIS — M25512 Pain in left shoulder: Secondary | ICD-10-CM | POA: Diagnosis not present

## 2017-10-08 DIAGNOSIS — G35 Multiple sclerosis: Secondary | ICD-10-CM | POA: Diagnosis not present

## 2017-10-08 DIAGNOSIS — G479 Sleep disorder, unspecified: Secondary | ICD-10-CM | POA: Diagnosis not present

## 2017-10-08 DIAGNOSIS — M545 Low back pain: Secondary | ICD-10-CM | POA: Diagnosis not present

## 2017-10-08 DIAGNOSIS — R2681 Unsteadiness on feet: Secondary | ICD-10-CM | POA: Insufficient documentation

## 2017-10-08 DIAGNOSIS — R2689 Other abnormalities of gait and mobility: Secondary | ICD-10-CM | POA: Insufficient documentation

## 2017-10-08 DIAGNOSIS — M6281 Muscle weakness (generalized): Secondary | ICD-10-CM | POA: Diagnosis not present

## 2017-10-08 NOTE — Therapy (Signed)
[place] MAIN [facility] SERVICES [address] [place], [place], [address] Phone: [phone]   Fax:  [phone]  Physical Therapy Treatment  Patient Details  Name: Lisa Williams MRN: [id number] Date of Birth: 1960/06/24 Referring Provider: Gurney Maxin, MD   Encounter Date: 10/08/2017  PT End of Session - 10/08/17 1212    Visit Number  14    Number of Visits  26    Date for PT Re-Evaluation  11/14/17    PT Start Time  1115    PT Stop Time  1201    PT Time Calculation (min)  46 min    Equipment Utilized During Treatment  Gait belt    Activity Tolerance  Patient tolerated treatment well;Patient limited by fatigue;Other (comment)    Behavior During Therapy  WFL for tasks assessed/performed       Past Medical History:  Diagnosis Date  . Abdominal pain, right upper quadrant   . Back pain   . Calculus of kidney 12/09/2013  . Chronic back pain    unspecified  . Chronic left shoulder pain 07/19/2015  . Functional disorder of bladder    other  . Galactorrhea 11/26/2014   Chronic   . Hereditary and idiopathic neuropathy 08/19/2013  . HPV test positive   . Hypercholesteremia 08/19/2013  . Incomplete bladder emptying   . Microscopic hematuria   . MS (multiple sclerosis) ([place])   . Muscle spasticity 05/21/2014  . Nonspecific findings on examination of urine    other  . Osteopenia   . Status post laparoscopic supracervical hysterectomy 11/26/2014  . Tobacco user 11/26/2014  . Wrist fracture     Past Surgical History:  Procedure Laterality Date  . bilateral tubal ligation  1996  . BREAST CYST EXCISION Left 2002  . KNEE SURGERY     right  . LAPAROSCOPIC SUPRACERVICAL HYSTERECTOMY  08/05/2013  . ORIF WRIST FRACTURE Left 01/17/2017   Procedure: OPEN REDUCTION INTERNAL FIXATION (ORIF) WRIST FRACTURE;  Surgeon: Lovell Sheehan, MD;  Location: [facility] ORS;  Service: Orthopedics;  Laterality: Left;  . TUBAL LIGATION Bilateral   . VAGINAL  HYSTERECTOMY  03/2006    There were no vitals filed for this visit.  Subjective Assessment - 10/08/17 1128    Subjective  Patient reports left leg feels a little "funny" like sharp needles on upper lateral side. Started this morning.  Reports going to funeral over the weekend.     Patient is accompained by:  Family member    Pertinent History  Pt is a 57 y/o F who presents with BLE weakness and difficulty ambulating due to h/o MS that was diagnosed in 1995. Pt denies any pain associated. Pt was admitted to the hospital in November 2018 due to MS exacerbation. Nov-Dec to HHPT-->[facility] until end of Jan. After rehab the pt was able to ambulate up to 100 ft with the RW. Pt has Bil AFO, her therapist at [facility] thinks she needs a new AFO as her feet still drag when she ambulates, pt reports HHPT mentioned a spring AFO. Pt has custom AFOs that were originally made 4 years ago. Goals: improve balance, to be able to get into the shower with her tub bench without assist, to be able to get up 2 steps at sister's home with railings on both sides but too far apart to reach both sides, to be able to drive adapted car. Pt is staying at other sister's house with one step to get inside and  currently picking up L leg with UEs and then stepping in. Pt steps down leading with LLE. Pt wants to work on walking side to side as this is challenging and she had not yet progressed to this at rehab. Pt able to get in/out the car with assist only to bring BLEs into the car. Hoping to drive by June or July with adapted car (already has one). Pt needs assist bringing LEs into and out of hospital bed. Pt able to perform sit>stand without assist from [facility] with a pillow on the seat for a boost. Pt denies any falls in the past 6 months. Pt has been increasing her time alone at home up to a few hours at a time to become more independent. Pt has both manual and power WC. Uses power WC at home, uses manual WC out in community. Pt able  to self propel manual WC. Pt needs assist putting pants around ankles but she can pull them up. Pt can put on her shoes but needs help getting her leg up to do so. Pt able to tie her shoelaces on her own. Pt has a tub/shower unit at home with a tub bench. No grab bars in the shower. Has hand held shower head. Pt currently taking a sponge bath. Pt has a regular height toilet with a BSC over top. Pt with h/o L wrist fx with no precautions, pt wore her soft brace to evaluation for comfort but reports her wrist has healed and is doing well following OT.     Limitations  Lifting;Standing;Walking;House hold activities    How long can you stand comfortably?  3 minutes without UE assist, limited by fatigue    How long can you walk comfortably?  100 ft, limited by fatigue    Patient Stated Goals  see above    Currently in Pain?  No/denies       Standing with BTB Hip flexion assist x BLE, standing marches 20x, noted decrease in hip flexion of LLE with difficulty weight accepting and placing foot.   Supine: passive manual:  Hamstring stretch 4x60 seconds LLE, 2x60 seconds RLE IT band stretch 4x60 seconds LLE Popliteal angle stretch 1x30 seconds LLE ER / IR rotation stretch 4x60 seconds  Seated: LLE Hip flexor quadriceps stretch 4x 60   SPT x 2  Pt. response to medical necessity: Patient will continue to benefit from skilled PT to improve mobility and independence                         PT Education - 10/08/17 1212    Education provided  Yes    Education Details  exercise technique, body mechanics    Person(s) Educated  Patient    Methods  Explanation;Demonstration;Verbal cues    Comprehension  Verbalized understanding;Returned demonstration       PT Short Term Goals - 09/25/17 1714      PT SHORT TERM GOAL #1   Title  Pt will be completed HEP with min assist from caregiver at least 4 days/wk for improved carryover between sessions    Time  2    Period  Weeks     Status  New      PT SHORT TERM GOAL #2   Title  Patient will report a worst pain of 6/10 on VAS in cervical spine to improve tolerance with ADLs and reduced symptoms with activities.     Baseline  4/30: 9/10 5/21: 0/10 pain  Time  2    Period  Weeks    Status  Achieved      PT SHORT TERM GOAL #3   Title  Patient will perform cervical rotation at least 50 degrees, flexion and extension to 70 degrees, and sidebending to 40 degrees without pain    Baseline  4/30: R 50 L 34 Rotation, R 26 L 35 sb, Extension 35 flex 35 ; 5/21: extension 46, SB R 41 L 40, rotation R 62 L 61,     Time  2    Period  Weeks    Status  Achieved        PT Long Term Goals - 09/25/17 1716      PT LONG TERM GOAL #1   Title  Pt's 5xSTS will impove to equal to or below 2 minutes to demonstrate improved balance and BLE strength    Baseline  3.02 minutes; 5/15: 1 min 30 seconds    Time  6    Period  Weeks    Status  Achieved      PT LONG TERM GOAL #2   Title  Pt's 52mT will improve to at least 0.5 m/s for improved safety ambulating in home    Baseline  0.14 m/s; 5/15: .240m     Time  8    Period  Weeks    Status  Partially Met      PT LONG TERM GOAL #3   Title  Pt's Bil hip F strength will improve to at least 2+/5 BLEs for improved gait mechanics and safety    Baseline  L: 2/5, R: 1/5    Time  8    Period  Weeks    Status  New      PT LONG TERM GOAL #4   Title  Pt's ABC Scale will improve to at least 40% to demonstrate improved balance    Baseline  28.13% 5/15; 24.4%    Time  6    Period  Weeks    Status  New      PT LONG TERM GOAL #5   Title  Pt will be able to perform car transfer without assist to work towards pt's goal of returning to driving    Baseline  only needs help with one leg to put onto running board to get into sisters car    Time  6    Period  Weeks    Status  Partially Met      PT LONG TERM GOAL #6   Title  Pt will be able to perform tub bench into/out of shower transfer  without assist for improved independence with this task    Baseline  challenged with steps at this time, getting better, stands and gives sponge bath independently now     Time  6    Period  Weeks    Status  Partially Met      PT LONG TERM GOAL #7   Title  Patient will perform cervical rotation at least 60 degrees, flexion and extension to 70 degrees, and sidebending to 40 degrees without pain    Baseline  4/30: R 50 L 34 Rotation, R 26 L 35 sb, Extension 35 flex 35; 5/21: extension 46, SB R 41 L 40, rotation R 62 L 61,     Time  4    Period  Weeks    Status  Achieved      PT LONG TERM GOAL #8   Title  Patient will improve NDI to <28% to reduce disability to mild  percieved disability for improved quality of life.     Baseline  4/30: 56% severe disability 5/21: 10%    Time  4    Period  Weeks    Status  Achieved      PT LONG TERM GOAL  #9   TITLE  Patient will report a worst pain of 3/10 on VAS in  cervical spine  to improve tolerance with ADLs and reduced symptoms with activities.     Baseline  4/30: 9/10 5/21: 0/10 in neck     Time  4    Period  Weeks    Status  Achieved      PT LONG TERM GOAL  #10   TITLE  Pt's 5xSTS will impove to equal to or below 50 seconds to demonstrate improved balance and BLE strength    Baseline  5/15: 1 min 30.9 seconds    Time  6    Period  Weeks    Status  New            Plan - 10/08/17 1213    Clinical Impression Statement  Patient initially presented with tingling of LLE with decreased weight acceptance and proprioception. After manual prolonged stretching of excessively tight LLE musculature tingling decreased and patient demonstrated greater accuracy of foot placement. LE tightness most likely due to excessive guarding from overcompensating during the heat over the weekend. Patient will continue to benefit from skilled PT to improve mobility and independence    Rehab Potential  Good    PT Frequency  2x / week    PT Duration  8 weeks     PT Treatment/Interventions  ADLs/Self Care Home Management;Aquatic Therapy;Biofeedback;Cryotherapy;Electrical Stimulation;Iontophoresis 58m/ml Dexamethasone;Traction;Moist Heat;Ultrasound;DME Instruction;Gait training;Stair training;Functional mobility training;Therapeutic activities;Therapeutic exercise;Balance training;Neuromuscular re-education;Patient/family education;Orthotic Fit/Training;Wheelchair mobility training;Manual techniques;Compression bandaging;Passive range of motion;Dry needling;Energy conservation;Taping;Splinting    PT Next Visit Plan  assess need for new AFOs or other orthotic to assist with ambulation, initiate strengthening program, practice and improve technique for bed mobility and sit<>stand    PT Home Exercise Plan  to initiate next session    Consulted and Agree with Plan of Care  Patient       Patient will benefit from skilled therapeutic intervention in order to improve the following deficits and impairments:  Abnormal gait, Decreased activity tolerance, Decreased balance, Decreased coordination, Decreased endurance, Decreased knowledge of use of DME, Decreased mobility, Decreased range of motion, Decreased safety awareness, Decreased strength, Difficulty walking, Impaired perceived functional ability, Impaired sensation, Impaired tone, Impaired UE functional use, Improper body mechanics, Postural dysfunction  Visit Diagnosis: Muscle weakness (generalized)  Other abnormalities of gait and mobility  Unsteadiness on feet     Problem List Patient Active Problem List   Diagnosis Date Noted  . Wrist fracture 01/16/2017  . Health care maintenance 01/24/2016  . Blood pressure elevated without history of HTN 10/25/2015  . [place] 10/02/2015  . Chronic left shoulder pain 07/19/2015  . [place] 07/14/2015  . [place]) [place] 11/26/2014  . Increased body mass index 11/26/2014  . HPV test positive 11/26/2014   . Status post laparoscopic supracervical hysterectomy 11/26/2014  . Galactorrhea 11/26/2014  . Tobacco user 11/26/2014  . Back ache 05/21/2014  . Adiposity 05/21/2014  . Disordered sleep 05/21/2014  . Muscle spasticity 05/21/2014  . Calculus of kidney 12/09/2013  . Renal colic 097/[id number] . Hypercholesteremia 08/19/2013  . Hereditary and idiopathic  neuropathy 08/19/2013  . Hypercholesterolemia without hypertriglyceridemia 08/19/2013  . Bladder infection, chronic 07/25/2012  . Disorder of bladder function 07/25/2012  . Incomplete bladder emptying 07/25/2012  . Microscopic hematuria 07/25/2012   Janna Arch, PT, DPT   10/08/2017, 12:14 PM  [place] MAIN [facility] SERVICES [address] [place], [place], [address] Phone: [phone]   Fax:  [phone]  Name: Lisa Williams MRN: [id number] Date of Birth: January 11, 1961

## 2017-10-10 ENCOUNTER — Ambulatory Visit: Payer: PPO

## 2017-10-10 DIAGNOSIS — M6281 Muscle weakness (generalized): Secondary | ICD-10-CM

## 2017-10-10 DIAGNOSIS — R2681 Unsteadiness on feet: Secondary | ICD-10-CM

## 2017-10-10 DIAGNOSIS — R2689 Other abnormalities of gait and mobility: Secondary | ICD-10-CM

## 2017-10-10 NOTE — Therapy (Signed)
[place] MAIN [facility] SERVICES [address] [place], [place], [address] Phone: [phone]   Fax:  [phone]  Physical Therapy Treatment  Patient Details  Name: Lisa Williams MRN: [id number] Date of Birth: 10-23-1960 Referring Provider: Gurney Maxin, MD   Encounter Date: 10/10/2017  PT End of Session - 10/10/17 1145    Visit Number  15    Number of Visits  26    Date for PT Re-Evaluation  11/14/17    PT Start Time  1117    PT Stop Time  1202    PT Time Calculation (min)  45 min    Equipment Utilized During Treatment  Gait belt    Activity Tolerance  Patient tolerated treatment well;Patient limited by fatigue;Other (comment)    Behavior During Therapy  WFL for tasks assessed/performed       Past Medical History:  Diagnosis Date  . Abdominal pain, right upper quadrant   . Back pain   . Calculus of kidney 12/09/2013  . Chronic back pain    unspecified  . Chronic left shoulder pain 07/19/2015  . Functional disorder of bladder    other  . Galactorrhea 11/26/2014   Chronic   . Hereditary and idiopathic neuropathy 08/19/2013  . HPV test positive   . Hypercholesteremia 08/19/2013  . Incomplete bladder emptying   . Microscopic hematuria   . MS (multiple sclerosis) ([place])   . Muscle spasticity 05/21/2014  . Nonspecific findings on examination of urine    other  . Osteopenia   . Status post laparoscopic supracervical hysterectomy 11/26/2014  . Tobacco user 11/26/2014  . Wrist fracture     Past Surgical History:  Procedure Laterality Date  . bilateral tubal ligation  1996  . BREAST CYST EXCISION Left 2002  . KNEE SURGERY     right  . LAPAROSCOPIC SUPRACERVICAL HYSTERECTOMY  08/05/2013  . ORIF WRIST FRACTURE Left 01/17/2017   Procedure: OPEN REDUCTION INTERNAL FIXATION (ORIF) WRIST FRACTURE;  Surgeon: Lovell Sheehan, MD;  Location: [facility] ORS;  Service: Orthopedics;  Laterality: Left;  . TUBAL LIGATION Bilateral   . VAGINAL  HYSTERECTOMY  03/2006    There were no vitals filed for this visit.            Subjective Assessment - 10/10/17 1121    Subjective  Patient reports waking up with a small cramp in the right leg but it went away after stretching. No stumbles or falls, no pain reported.     Patient is accompained by:  Family member    Pertinent History  Pt is a 57 y/o F who presents with BLE weakness and difficulty ambulating due to h/o MS that was diagnosed in 1995. Pt denies any pain associated. Pt was admitted to the hospital in November 2018 due to MS exacerbation. Nov-Dec to HHPT-->[facility] until end of Jan. After rehab the pt was able to ambulate up to 100 ft with the RW. Pt has Bil AFO, her therapist at [place] thinks she needs a new AFO as her feet still drag when she ambulates, pt reports HHPT mentioned a spring AFO. Pt has custom AFOs that were originally made 4 years ago. Goals: improve balance, to be able to get into the shower with her tub bench without assist, to be able to get up 2 steps at sister's home with railings on both sides but too far apart to reach both sides, to be able to drive adapted car. Pt is staying at other  sister's house with one step to get inside and currently picking up L leg with UEs and then stepping in. Pt steps down leading with LLE. Pt wants to work on walking side to side as this is challenging and she had not yet progressed to this at rehab. Pt able to get in/out the car with assist only to bring BLEs into the car. Hoping to drive by June or July with adapted car (already has one). Pt needs assist bringing LEs into and out of hospital bed. Pt able to perform sit>stand without assist from [facility] with a pillow on the seat for a boost. Pt denies any falls in the past 6 months. Pt has been increasing her time alone at home up to a few hours at a time to become more independent. Pt has both manual and power WC. Uses power WC at home, uses manual WC out in community. Pt able  to self propel manual WC. Pt needs assist putting pants around ankles but she can pull them up. Pt can put on her shoes but needs help getting her leg up to do so. Pt able to tie her shoelaces on her own. Pt has a tub/shower unit at home with a tub bench. No grab bars in the shower. Has hand held shower head. Pt currently taking a sponge bath. Pt has a regular height toilet with a BSC over top. Pt with h/o L wrist fx with no precautions, pt wore her soft brace to evaluation for comfort but reports her wrist has healed and is doing well following OT.     Limitations  Lifting;Standing;Walking;House hold activities    How long can you stand comfortably?  3 minutes without UE assist, limited by fatigue    How long can you walk comfortably?  100 ft, limited by fatigue    Patient Stated Goals  see above    Currently in Pain?  No/denies       Ice pack on abdomen during resting breaks    All standing interventions performed with CGA     5x STS x6 trials, L hand pushing up on the matt R hand on walker.  CGA,    Seated: overhead weighted ball raise 10x; 3 sets  ; 3000 G  Seated: weighted ball twists 10x each direction; 3 trials 3000 G   Kicking soccer all with LAQ in seated "modified with PT placing ball at feet" to kick to cone x 8 each trial.   Seated balloon pass x 30 passes, reaching inside and outside BOS  Seated abduction isometrics 2x10 each leg    Patient will continue to benefit from skilled PT to improve mobility and independence                      PT Education - 10/10/17 1138    Education provided  Yes    Education Details  exercise technique, ody mechanics transfers    Person(s) Educated  Patient    Methods  Explanation;Demonstration;Verbal cues    Comprehension  Verbalized understanding;Returned demonstration       PT Short Term Goals - 09/25/17 1714      PT SHORT TERM GOAL #1   Title  Pt will be completed HEP with min assist from caregiver at least 4  days/wk for improved carryover between sessions    Time  2    Period  Weeks    Status  New      PT SHORT TERM GOAL #2  Title  Patient will report a worst pain of 6/10 on VAS in cervical spine to improve tolerance with ADLs and reduced symptoms with activities.     Baseline  4/30: 9/10 5/21: 0/10 pain    Time  2    Period  Weeks    Status  Achieved      PT SHORT TERM GOAL #3   Title  Patient will perform cervical rotation at least 50 degrees, flexion and extension to 70 degrees, and sidebending to 40 degrees without pain    Baseline  4/30: R 50 L 34 Rotation, R 26 L 35 sb, Extension 35 flex 35 ; 5/21: extension 46, SB R 41 L 40, rotation R 62 L 61,     Time  2    Period  Weeks    Status  Achieved        PT Long Term Goals - 09/25/17 1716      PT LONG TERM GOAL #1   Title  Pt's 5xSTS will impove to equal to or below 2 minutes to demonstrate improved balance and BLE strength    Baseline  3.02 minutes; 5/15: 1 min 30 seconds    Time  6    Period  Weeks    Status  Achieved      PT LONG TERM GOAL #2   Title  Pt's 25mT will improve to at least 0.5 m/s for improved safety ambulating in home    Baseline  0.14 m/s; 5/15: .211m     Time  8    Period  Weeks    Status  Partially Met      PT LONG TERM GOAL #3   Title  Pt's Bil hip F strength will improve to at least 2+/5 BLEs for improved gait mechanics and safety    Baseline  L: 2/5, R: 1/5    Time  8    Period  Weeks    Status  New      PT LONG TERM GOAL #4   Title  Pt's ABC Scale will improve to at least 40% to demonstrate improved balance    Baseline  28.13% 5/15; 24.4%    Time  6    Period  Weeks    Status  New      PT LONG TERM GOAL #5   Title  Pt will be able to perform car transfer without assist to work towards pt's goal of returning to driving    Baseline  only needs help with one leg to put onto running board to get into sisters car    Time  6    Period  Weeks    Status  Partially Met      PT LONG TERM GOAL  #6   Title  Pt will be able to perform tub bench into/out of shower transfer without assist for improved independence with this task    Baseline  challenged with steps at this time, getting better, stands and gives sponge bath independently now     Time  6    Period  Weeks    Status  Partially Met      PT LONG TERM GOAL #7   Title  Patient will perform cervical rotation at least 60 degrees, flexion and extension to 70 degrees, and sidebending to 40 degrees without pain    Baseline  4/30: R 50 L 34 Rotation, R 26 L 35 sb, Extension 35 flex 35; 5/21: extension 46, SB R 41 L  40, rotation R 62 L 61,     Time  4    Period  Weeks    Status  Achieved      PT LONG TERM GOAL #8   Title  Patient will improve NDI to <28% to reduce disability to mild  percieved disability for improved quality of life.     Baseline  4/30: 56% severe disability 5/21: 10%    Time  4    Period  Weeks    Status  Achieved      PT LONG TERM GOAL  #9   TITLE  Patient will report a worst pain of 3/10 on VAS in  cervical spine  to improve tolerance with ADLs and reduced symptoms with activities.     Baseline  4/30: 9/10 5/21: 0/10 in neck     Time  4    Period  Weeks    Status  Achieved      PT LONG TERM GOAL  #10   TITLE  Pt's 5xSTS will impove to equal to or below 50 seconds to demonstrate improved balance and BLE strength    Baseline  5/15: 1 min 30.9 seconds    Time  6    Period  Weeks    Status  New            Plan - 10/10/17 1238    Clinical Impression Statement  Patient demonstrated improved technique with sit to stand from raised surface allowing patient to utilize LE strength more than UE. Fatigue resulted in frequent rest breaks. Abdominal strengthening performed with patient demonstrating fatigue. Patient will continue to benefit from skilled PT to improve mobility and independence    Rehab Potential  Good    PT Frequency  2x / week    PT Duration  8 weeks    PT Treatment/Interventions  ADLs/Self  Care Home Management;Aquatic Therapy;Biofeedback;Cryotherapy;Electrical Stimulation;Iontophoresis 54m/ml Dexamethasone;Traction;Moist Heat;Ultrasound;DME Instruction;Gait training;Stair training;Functional mobility training;Therapeutic activities;Therapeutic exercise;Balance training;Neuromuscular re-education;Patient/family education;Orthotic Fit/Training;Wheelchair mobility training;Manual techniques;Compression bandaging;Passive range of motion;Dry needling;Energy conservation;Taping;Splinting    PT Next Visit Plan  assess need for new AFOs or other orthotic to assist with ambulation, initiate strengthening program, practice and improve technique for bed mobility and sit<>stand    PT Home Exercise Plan  to initiate next session    Consulted and Agree with Plan of Care  Patient       Patient will benefit from skilled therapeutic intervention in order to improve the following deficits and impairments:  Abnormal gait, Decreased activity tolerance, Decreased balance, Decreased coordination, Decreased endurance, Decreased knowledge of use of DME, Decreased mobility, Decreased range of motion, Decreased safety awareness, Decreased strength, Difficulty walking, Impaired perceived functional ability, Impaired sensation, Impaired tone, Impaired UE functional use, Improper body mechanics, Postural dysfunction  Visit Diagnosis: Muscle weakness (generalized)  Other abnormalities of gait and mobility  Unsteadiness on feet     Problem List Patient Active Problem List   Diagnosis Date Noted  . Wrist fracture 01/16/2017  . Health care maintenance 01/24/2016  . Blood pressure elevated without history of HTN 10/25/2015  . Multiple sclerosis ([place] 10/02/2015  . Chronic left shoulder pain 07/19/2015  . [place] 07/14/2015  . [place] 11/26/2014  . Increased body mass index 11/26/2014  . HPV test positive 11/26/2014  . Status post laparoscopic  supracervical hysterectomy 11/26/2014  . Galactorrhea 11/26/2014  . Tobacco user 11/26/2014  . Back ache 05/21/2014  . Adiposity 05/21/2014  . Disordered sleep 05/21/2014  .  Muscle spasticity 05/21/2014  . Calculus of kidney 12/09/2013  . Renal colic 48/18/5631  . Hypercholesteremia 08/19/2013  . Hereditary and idiopathic neuropathy 08/19/2013  . Hypercholesterolemia without hypertriglyceridemia 08/19/2013  . Bladder infection, chronic 07/25/2012  . Disorder of bladder function 07/25/2012  . Incomplete bladder emptying 07/25/2012  . Microscopic hematuria 07/25/2012   Janna Arch, PT, DPT   10/10/2017, 12:40 PM  [place] MAIN [facility] SERVICES [address] [place], [place], [address] Phone: [phone]   Fax:  [phone]  Name: Mc Hollen Zamorano MRN: [id number] Date of Birth: 11-Oct-1960

## 2017-10-12 DIAGNOSIS — G35 Multiple sclerosis: Secondary | ICD-10-CM | POA: Diagnosis not present

## 2017-10-15 ENCOUNTER — Ambulatory Visit: Payer: PPO

## 2017-10-17 ENCOUNTER — Ambulatory Visit: Payer: PPO

## 2017-10-17 DIAGNOSIS — M6281 Muscle weakness (generalized): Secondary | ICD-10-CM

## 2017-10-17 DIAGNOSIS — R2681 Unsteadiness on feet: Secondary | ICD-10-CM

## 2017-10-17 DIAGNOSIS — R2689 Other abnormalities of gait and mobility: Secondary | ICD-10-CM

## 2017-10-17 NOTE — Therapy (Signed)
[place] MAIN [facility] SERVICES [address] [place], [place], [address] Phone: [phone]   Fax:  [phone]  Physical Therapy Treatment  Patient Details  Name: Lisa Williams MRN: [id number] Date of Birth: 1961-03-17 Referring Provider: Gurney Maxin, MD   Encounter Date: 10/17/2017  PT End of Session - 10/17/17 1249    Visit Number  16    Number of Visits  26    Date for PT Re-Evaluation  11/14/17    PT Start Time  1118    PT Stop Time  1203    PT Time Calculation (min)  45 min    Equipment Utilized During Treatment  Gait belt    Activity Tolerance  Patient tolerated treatment well;Patient limited by fatigue;Other (comment)    Behavior During Therapy  WFL for tasks assessed/performed       Past Medical History:  Diagnosis Date  . Abdominal pain, right upper quadrant   . Back pain   . Calculus of kidney 12/09/2013  . Chronic back pain    unspecified  . Chronic left shoulder pain 07/19/2015  . Functional disorder of bladder    other  . Galactorrhea 11/26/2014   Chronic   . Hereditary and idiopathic neuropathy 08/19/2013  . HPV test positive   . Hypercholesteremia 08/19/2013  . Incomplete bladder emptying   . Microscopic hematuria   . MS (multiple sclerosis) ([place])   . Muscle spasticity 05/21/2014  . Nonspecific findings on examination of urine    other  . Osteopenia   . Status post laparoscopic supracervical hysterectomy 11/26/2014  . Tobacco user 11/26/2014  . Wrist fracture     Past Surgical History:  Procedure Laterality Date  . bilateral tubal ligation  1996  . BREAST CYST EXCISION Left 2002  . KNEE SURGERY     right  . LAPAROSCOPIC SUPRACERVICAL HYSTERECTOMY  08/05/2013  . ORIF WRIST FRACTURE Left 01/17/2017   Procedure: OPEN REDUCTION INTERNAL FIXATION (ORIF) WRIST FRACTURE;  Surgeon: Lovell Sheehan, MD;  Location: [facility] ORS;  Service: Orthopedics;  Laterality: Left;  . TUBAL LIGATION Bilateral   . VAGINAL  HYSTERECTOMY  03/2006    There were no vitals filed for this visit.  Subjective Assessment - 10/17/17 1247    Subjective  Patient reports orthotist corrected R AFO for improved R foot df. Patient reports being able to lift legs for longer duration yesterday at doctor offices.     Patient is accompained by:  Family member    Pertinent History  Pt is a 57 y/o F who presents with BLE weakness and difficulty ambulating due to h/o MS that was diagnosed in 1995. Pt denies any pain associated. Pt was admitted to the hospital in November 2018 due to MS exacerbation. Nov-Dec to HHPT-->[facility] until end of Jan. After rehab the pt was able to ambulate up to 100 ft with the RW. Pt has Bil AFO, her therapist at [facility] thinks she needs a new AFO as her feet still drag when she ambulates, pt reports HHPT mentioned a spring AFO. Pt has custom AFOs that were originally made 4 years ago. Goals: improve balance, to be able to get into the shower with her tub bench without assist, to be able to get up 2 steps at sister's home with railings on both sides but too far apart to reach both sides, to be able to drive adapted car. Pt is staying at other sister's house with one step to get inside and currently  picking up L leg with UEs and then stepping in. Pt steps down leading with LLE. Pt wants to work on walking side to side as this is challenging and she had not yet progressed to this at rehab. Pt able to get in/out the car with assist only to bring BLEs into the car. Hoping to drive by June or July with adapted car (already has one). Pt needs assist bringing LEs into and out of hospital bed. Pt able to perform sit>stand without assist from [facility] with a pillow on the seat for a boost. Pt denies any falls in the past 6 months. Pt has been increasing her time alone at home up to a few hours at a time to become more independent. Pt has both manual and power WC. Uses power WC at home, uses manual WC out in community. Pt able to  self propel manual WC. Pt needs assist putting pants around ankles but she can pull them up. Pt can put on her shoes but needs help getting her leg up to do so. Pt able to tie her shoelaces on her own. Pt has a tub/shower unit at home with a tub bench. No grab bars in the shower. Has hand held shower head. Pt currently taking a sponge bath. Pt has a regular height toilet with a BSC over top. Pt with h/o L wrist fx with no precautions, pt wore her soft brace to evaluation for comfort but reports her wrist has healed and is doing well following OT.     Limitations  Lifting;Standing;Walking;House hold activities    How long can you stand comfortably?  3 minutes without UE assist, limited by fatigue    How long can you walk comfortably?  100 ft, limited by fatigue    Patient Stated Goals  see above    Currently in Pain?  No/denies       Ambulate 6 ft with Walker and CGA, decreased foot drag RLE. X 2 trials  Examine AFO alteration: increased df of RLE for decreased foot drop and decreased tone  Supine: Hamstring stretch BLE 1x90 seconds with df overpressure  Popliteal angle stretch BLE 1x60 seconds  IT band/abductor stretch BLE 1x90 seconds.  DF stretch 2x90 seconds with overpressure at first met to reduce tone, BLE  Bridges with pt crossing arms over chest and PT holding LE's into place. 2x10 with patient demonstrating gluteal activation and slight clearance from mat.    Supine <> sit: Mod A for LE  Seated:  Balloon pass with PT to challenge patient reach within and outside of BOS for core activation. LE supported 3 minutes  Soccer ball LAQ kicks 12x each leg, BUE support  Sit to stand: focus on primarily utilizing LE's with tactile cueing to RLE for support. Decreased need for assistance demonstrating carryover from previous session.                     PT Education - 10/17/17 1248    Education provided  Yes    Education Details  exercise technique, LE body mechanics  and length, transfers    Person(s) Educated  Patient    Methods  Explanation;Demonstration;Verbal cues    Comprehension  Verbalized understanding;Returned demonstration       PT Short Term Goals - 09/25/17 1714      PT SHORT TERM GOAL #1   Title  Pt will be completed HEP with min assist from caregiver at least 4 days/wk for improved carryover between sessions  Time  2    Period  Weeks    Status  New      PT SHORT TERM GOAL #2   Title  Patient will report a worst pain of 6/10 on VAS in cervical spine to improve tolerance with ADLs and reduced symptoms with activities.     Baseline  4/30: 9/10 5/21: 0/10 pain    Time  2    Period  Weeks    Status  Achieved      PT SHORT TERM GOAL #3   Title  Patient will perform cervical rotation at least 50 degrees, flexion and extension to 70 degrees, and sidebending to 40 degrees without pain    Baseline  4/30: R 50 L 34 Rotation, R 26 L 35 sb, Extension 35 flex 35 ; 5/21: extension 46, SB R 41 L 40, rotation R 62 L 61,     Time  2    Period  Weeks    Status  Achieved        PT Long Term Goals - 09/25/17 1716      PT LONG TERM GOAL #1   Title  Pt's 5xSTS will impove to equal to or below 2 minutes to demonstrate improved balance and BLE strength    Baseline  3.02 minutes; 5/15: 1 min 30 seconds    Time  6    Period  Weeks    Status  Achieved      PT LONG TERM GOAL #2   Title  Pt's 65mT will improve to at least 0.5 m/s for improved safety ambulating in home    Baseline  0.14 m/s; 5/15: .297m     Time  8    Period  Weeks    Status  Partially Met      PT LONG TERM GOAL #3   Title  Pt's Bil hip F strength will improve to at least 2+/5 BLEs for improved gait mechanics and safety    Baseline  L: 2/5, R: 1/5    Time  8    Period  Weeks    Status  New      PT LONG TERM GOAL #4   Title  Pt's ABC Scale will improve to at least 40% to demonstrate improved balance    Baseline  28.13% 5/15; 24.4%    Time  6    Period  Weeks     Status  New      PT LONG TERM GOAL #5   Title  Pt will be able to perform car transfer without assist to work towards pt's goal of returning to driving    Baseline  only needs help with one leg to put onto running board to get into sisters car    Time  6    Period  Weeks    Status  Partially Met      PT LONG TERM GOAL #6   Title  Pt will be able to perform tub bench into/out of shower transfer without assist for improved independence with this task    Baseline  challenged with steps at this time, getting better, stands and gives sponge bath independently now     Time  6    Period  Weeks    Status  Partially Met      PT LONG TERM GOAL #7   Title  Patient will perform cervical rotation at least 60 degrees, flexion and extension to 70 degrees, and sidebending to 40 degrees without pain  Baseline  4/30: R 50 L 34 Rotation, R 26 L 35 sb, Extension 35 flex 35; 5/21: extension 46, SB R 41 L 40, rotation R 62 L 61,     Time  4    Period  Weeks    Status  Achieved      PT LONG TERM GOAL #8   Title  Patient will improve NDI to <28% to reduce disability to mild  percieved disability for improved quality of life.     Baseline  4/30: 56% severe disability 5/21: 10%    Time  4    Period  Weeks    Status  Achieved      PT LONG TERM GOAL  #9   TITLE  Patient will report a worst pain of 3/10 on VAS in  cervical spine  to improve tolerance with ADLs and reduced symptoms with activities.     Baseline  4/30: 9/10 5/21: 0/10 in neck     Time  4    Period  Weeks    Status  Achieved      PT LONG TERM GOAL  #10   TITLE  Pt's 5xSTS will impove to equal to or below 50 seconds to demonstrate improved balance and BLE strength    Baseline  5/15: 1 min 30.9 seconds    Time  6    Period  Weeks    Status  New            Plan - 10/17/17 1250    Clinical Impression Statement  Patient demonstrates improved clearance of R foot with ambulation. LE musculature is improved with increased mobility and  recruitment patterns. Patient muscle tissue length improved after prolonged holds. Patient will continue to benefit from skilled PT to improve mobility and independence    Rehab Potential  Good    PT Frequency  2x / week    PT Duration  8 weeks    PT Treatment/Interventions  ADLs/Self Care Home Management;Aquatic Therapy;Biofeedback;Cryotherapy;Electrical Stimulation;Iontophoresis 69m/ml Dexamethasone;Traction;Moist Heat;Ultrasound;DME Instruction;Gait training;Stair training;Functional mobility training;Therapeutic activities;Therapeutic exercise;Balance training;Neuromuscular re-education;Patient/family education;Orthotic Fit/Training;Wheelchair mobility training;Manual techniques;Compression bandaging;Passive range of motion;Dry needling;Energy conservation;Taping;Splinting    PT Next Visit Plan  assess need for new AFOs or other orthotic to assist with ambulation, initiate strengthening program, practice and improve technique for bed mobility and sit<>stand    PT Home Exercise Plan  to initiate next session    Consulted and Agree with Plan of Care  Patient       Patient will benefit from skilled therapeutic intervention in order to improve the following deficits and impairments:  Abnormal gait, Decreased activity tolerance, Decreased balance, Decreased coordination, Decreased endurance, Decreased knowledge of use of DME, Decreased mobility, Decreased range of motion, Decreased safety awareness, Decreased strength, Difficulty walking, Impaired perceived functional ability, Impaired sensation, Impaired tone, Impaired UE functional use, Improper body mechanics, Postural dysfunction  Visit Diagnosis: Muscle weakness (generalized)  Other abnormalities of gait and mobility  Unsteadiness on feet     Problem List Patient Active Problem List   Diagnosis Date Noted  . Wrist fracture 01/16/2017  . Health care maintenance 01/24/2016  . Blood pressure elevated without history of HTN 10/25/2015  .  Multiple sclerosis ([place] 10/02/2015  . Chronic left shoulder pain 07/19/2015  . [place] 07/14/2015  . [place] 11/26/2014  . Increased body mass index 11/26/2014  . HPV test positive 11/26/2014  . Status post laparoscopic supracervical hysterectomy 11/26/2014  . Galactorrhea 11/26/2014  .  Tobacco user 11/26/2014  . Back ache 05/21/2014  . Adiposity 05/21/2014  . Disordered sleep 05/21/2014  . Muscle spasticity 05/21/2014  . Calculus of kidney 12/09/2013  . Renal colic 92/43/8365  . Hypercholesteremia 08/19/2013  . Hereditary and idiopathic neuropathy 08/19/2013  . Hypercholesterolemia without hypertriglyceridemia 08/19/2013  . Bladder infection, chronic 07/25/2012  . Disorder of bladder function 07/25/2012  . Incomplete bladder emptying 07/25/2012  . Microscopic hematuria 07/25/2012   Janna Arch, PT, DPT   10/17/2017, 12:51 PM  [place] MAIN [facility] SERVICES [address] [place], [place], [address] Phone: [phone]   Fax:  [phone]  Name: Sherlynn [place] Russ MRN: [id number] Date of Birth: 10-13-1960

## 2017-10-22 ENCOUNTER — Ambulatory Visit: Payer: PPO

## 2017-10-22 DIAGNOSIS — M6281 Muscle weakness (generalized): Secondary | ICD-10-CM

## 2017-10-22 DIAGNOSIS — R2689 Other abnormalities of gait and mobility: Secondary | ICD-10-CM

## 2017-10-22 DIAGNOSIS — R2681 Unsteadiness on feet: Secondary | ICD-10-CM

## 2017-10-22 NOTE — Therapy (Signed)
[place] MAIN [facility] SERVICES [address] [place], [place], [address] Phone: [phone]   Fax:  [phone]  Physical Therapy Treatment  Patient Details  Name: Lisa Williams MRN: [id number] Date of Birth: 1960-11-10 Referring Provider: Gurney Maxin, MD   Encounter Date: 10/22/2017  PT End of Session - 10/22/17 1230    Visit Number  17    Number of Visits  26    Date for PT Re-Evaluation  11/14/17    PT Start Time  1115    PT Stop Time  1201    PT Time Calculation (min)  46 min    Equipment Utilized During Treatment  Gait belt    Activity Tolerance  Patient tolerated treatment well;Patient limited by fatigue;Other (comment)    Behavior During Therapy  WFL for tasks assessed/performed       Past Medical History:  Diagnosis Date  . Abdominal pain, right upper quadrant   . Back pain   . Calculus of kidney 12/09/2013  . Chronic back pain    unspecified  . Chronic left shoulder pain 07/19/2015  . Functional disorder of bladder    other  . Galactorrhea 11/26/2014   Chronic   . Hereditary and idiopathic neuropathy 08/19/2013  . HPV test positive   . Hypercholesteremia 08/19/2013  . Incomplete bladder emptying   . Microscopic hematuria   . MS (multiple sclerosis) ([place])   . Muscle spasticity 05/21/2014  . Nonspecific findings on examination of urine    other  . Osteopenia   . Status post laparoscopic supracervical hysterectomy 11/26/2014  . Tobacco user 11/26/2014  . Wrist fracture     Past Surgical History:  Procedure Laterality Date  . bilateral tubal ligation  1996  . BREAST CYST EXCISION Left 2002  . KNEE SURGERY     right  . LAPAROSCOPIC SUPRACERVICAL HYSTERECTOMY  08/05/2013  . ORIF WRIST FRACTURE Left 01/17/2017   Procedure: OPEN REDUCTION INTERNAL FIXATION (ORIF) WRIST FRACTURE;  Surgeon: Lovell Sheehan, MD;  Location: [facility] ORS;  Service: Orthopedics;  Laterality: Left;  . TUBAL LIGATION Bilateral   . VAGINAL  HYSTERECTOMY  03/2006    There were no vitals filed for this visit.  Subjective Assessment - 10/22/17 1118    Subjective  Patient reports compliance with HEP. Had a birthday since last session. No stumbles or falls since last session.     Patient is accompained by:  Family member    Pertinent History  Pt is a 57 y/o F who presents with BLE weakness and difficulty ambulating due to h/o MS that was diagnosed in 1995. Pt denies any pain associated. Pt was admitted to the hospital in November 2018 due to MS exacerbation. Nov-Dec to HHPT-->[facility] until end of Jan. After rehab the pt was able to ambulate up to 100 ft with the RW. Pt has Bil AFO, her therapist at [facility] thinks she needs a new AFO as her feet still drag when she ambulates, pt reports HHPT mentioned a spring AFO. Pt has custom AFOs that were originally made 4 years ago. Goals: improve balance, to be able to get into the shower with her tub bench without assist, to be able to get up 2 steps at sister's home with railings on both sides but too far apart to reach both sides, to be able to drive adapted car. Pt is staying at other sister's house with one step to get inside and currently picking up L leg with UEs and  then stepping in. Pt steps down leading with LLE. Pt wants to work on walking side to side as this is challenging and she had not yet progressed to this at rehab. Pt able to get in/out the car with assist only to bring BLEs into the car. Hoping to drive by June or July with adapted car (already has one). Pt needs assist bringing LEs into and out of hospital bed. Pt able to perform sit>stand without assist from [facility] with a pillow on the seat for a boost. Pt denies any falls in the past 6 months. Pt has been increasing her time alone at home up to a few hours at a time to become more independent. Pt has both manual and power WC. Uses power WC at home, uses manual WC out in community. Pt able to self propel manual WC. Pt needs assist  putting pants around ankles but she can pull them up. Pt can put on her shoes but needs help getting her leg up to do so. Pt able to tie her shoelaces on her own. Pt has a tub/shower unit at home with a tub bench. No grab bars in the shower. Has hand held shower head. Pt currently taking a sponge bath. Pt has a regular height toilet with a BSC over top. Pt with h/o L wrist fx with no precautions, pt wore her soft brace to evaluation for comfort but reports her wrist has healed and is doing well following OT.     Limitations  Lifting;Standing;Walking;House hold activities    How long can you stand comfortably?  3 minutes without UE assist, limited by fatigue    How long can you walk comfortably?  100 ft, limited by fatigue    Patient Stated Goals  see above    Currently in Pain?  No/denies      step over and back red theraband on floor x6 each leg.   Standing marches 10x each leg BUE support  Step forward and back after marches 10x with focus on lifting leg   STS from [facility] to // bars  Balloon // bars single UE support to no UE support  2 minutes    Ambulation in // bars 2xlengths, Cues to avoid trunk flexion and to initiate step by moving pelvis forward.    Seated: overhead weighted ball raise 10x; 3 sets  ; 3000 G  Seated: weighted ball twists 10x each direction; 3 trials 3000 G   Seated abduction isometrics 3x10 each leg 3 second holds  Seated adduction isometric 3x10 each leg, 3 second holds    Pt. response to medical necessity: Patient will continue to benefit from skilled PT to improve mobility and independence    Ice pack on abdomen during resting breaks  All standing interventions performed with CGA                  PT Education - 10/22/17 1229    Education provided  Yes    Education Details  exercise technique, LE body mechanics.     Person(s) Educated  Patient    Methods  Explanation;Demonstration;Verbal cues    Comprehension  Verbalized  understanding;Returned demonstration       PT Short Term Goals - 09/25/17 1714      PT SHORT TERM GOAL #1   Title  Pt will be completed HEP with min assist from caregiver at least 4 days/wk for improved carryover between sessions    Time  2    Period  Weeks    Status  New      PT SHORT TERM GOAL #2   Title  Patient will report a worst pain of 6/10 on VAS in cervical spine to improve tolerance with ADLs and reduced symptoms with activities.     Baseline  4/30: 9/10 5/21: 0/10 pain    Time  2    Period  Weeks    Status  Achieved      PT SHORT TERM GOAL #3   Title  Patient will perform cervical rotation at least 50 degrees, flexion and extension to 70 degrees, and sidebending to 40 degrees without pain    Baseline  4/30: R 50 L 34 Rotation, R 26 L 35 sb, Extension 35 flex 35 ; 5/21: extension 46, SB R 41 L 40, rotation R 62 L 61,     Time  2    Period  Weeks    Status  Achieved        PT Long Term Goals - 09/25/17 1716      PT LONG TERM GOAL #1   Title  Pt's 5xSTS will impove to equal to or below 2 minutes to demonstrate improved balance and BLE strength    Baseline  3.02 minutes; 5/15: 1 min 30 seconds    Time  6    Period  Weeks    Status  Achieved      PT LONG TERM GOAL #2   Title  Pt's 40mT will improve to at least 0.5 m/s for improved safety ambulating in home    Baseline  0.14 m/s; 5/15: .238m     Time  8    Period  Weeks    Status  Partially Met      PT LONG TERM GOAL #3   Title  Pt's Bil hip F strength will improve to at least 2+/5 BLEs for improved gait mechanics and safety    Baseline  L: 2/5, R: 1/5    Time  8    Period  Weeks    Status  New      PT LONG TERM GOAL #4   Title  Pt's ABC Scale will improve to at least 40% to demonstrate improved balance    Baseline  28.13% 5/15; 24.4%    Time  6    Period  Weeks    Status  New      PT LONG TERM GOAL #5   Title  Pt will be able to perform car transfer without assist to work towards pt's goal of  returning to driving    Baseline  only needs help with one leg to put onto running board to get into sisters car    Time  6    Period  Weeks    Status  Partially Met      PT LONG TERM GOAL #6   Title  Pt will be able to perform tub bench into/out of shower transfer without assist for improved independence with this task    Baseline  challenged with steps at this time, getting better, stands and gives sponge bath independently now     Time  6    Period  Weeks    Status  Partially Met      PT LONG TERM GOAL #7   Title  Patient will perform cervical rotation at least 60 degrees, flexion and extension to 70 degrees, and sidebending to 40 degrees without pain    Baseline  4/30: R 50  L 34 Rotation, R 26 L 35 sb, Extension 35 flex 35; 5/21: extension 46, SB R 41 L 40, rotation R 62 L 61,     Time  4    Period  Weeks    Status  Achieved      PT LONG TERM GOAL #8   Title  Patient will improve NDI to <28% to reduce disability to mild  percieved disability for improved quality of life.     Baseline  4/30: 56% severe disability 5/21: 10%    Time  4    Period  Weeks    Status  Achieved      PT LONG TERM GOAL  #9   TITLE  Patient will report a worst pain of 3/10 on VAS in  cervical spine  to improve tolerance with ADLs and reduced symptoms with activities.     Baseline  4/30: 9/10 5/21: 0/10 in neck     Time  4    Period  Weeks    Status  Achieved      PT LONG TERM GOAL  #10   TITLE  Pt's 5xSTS will impove to equal to or below 50 seconds to demonstrate improved balance and BLE strength    Baseline  5/15: 1 min 30.9 seconds    Time  6    Period  Weeks    Status  New            Plan - 10/22/17 1232    Clinical Impression Statement  Patient demonstrates good carryover of sit to stand from previous setting with good muscle activation of LE's resulting in decreased reliance upon UEs. Patient performed abdominal, UE, and LE strengthening with frequent rest breaks.Patient will continue to  benefit from skilled PT to improve mobility and independence     Rehab Potential  Good    PT Frequency  2x / week    PT Duration  8 weeks    PT Treatment/Interventions  ADLs/Self Care Home Management;Aquatic Therapy;Biofeedback;Cryotherapy;Electrical Stimulation;Iontophoresis 68m/ml Dexamethasone;Traction;Moist Heat;Ultrasound;DME Instruction;Gait training;Stair training;Functional mobility training;Therapeutic activities;Therapeutic exercise;Balance training;Neuromuscular re-education;Patient/family education;Orthotic Fit/Training;Wheelchair mobility training;Manual techniques;Compression bandaging;Passive range of motion;Dry needling;Energy conservation;Taping;Splinting    PT Next Visit Plan  assess need for new AFOs or other orthotic to assist with ambulation, initiate strengthening program, practice and improve technique for bed mobility and sit<>stand    PT Home Exercise Plan  to initiate next session    Consulted and Agree with Plan of Care  Patient       Patient will benefit from skilled therapeutic intervention in order to improve the following deficits and impairments:  Abnormal gait, Decreased activity tolerance, Decreased balance, Decreased coordination, Decreased endurance, Decreased knowledge of use of DME, Decreased mobility, Decreased range of motion, Decreased safety awareness, Decreased strength, Difficulty walking, Impaired perceived functional ability, Impaired sensation, Impaired tone, Impaired UE functional use, Improper body mechanics, Postural dysfunction  Visit Diagnosis: Muscle weakness (generalized)  Other abnormalities of gait and mobility  Unsteadiness on feet     Problem List Patient Active Problem List   Diagnosis Date Noted  . Wrist fracture 01/16/2017  . Health care maintenance 01/24/2016  . Blood pressure elevated without history of HTN 10/25/2015  . Multiple sclerosis ([place] 10/02/2015  . Chronic left shoulder pain 07/19/2015  . [place] 07/14/2015  . MS (multiple [place]) ([place] 11/26/2014  . Increased body mass index 11/26/2014  . HPV test positive 11/26/2014  . Status post laparoscopic supracervical hysterectomy 11/26/2014  . Galactorrhea 11/26/2014  .  Tobacco user 11/26/2014  . Back ache 05/21/2014  . Adiposity 05/21/2014  . Disordered sleep 05/21/2014  . Muscle spasticity 05/21/2014  . Calculus of kidney 12/09/2013  . Renal colic 41/74/0814  . Hypercholesteremia 08/19/2013  . Hereditary and idiopathic neuropathy 08/19/2013  . Hypercholesterolemia without hypertriglyceridemia 08/19/2013  . Bladder infection, chronic 07/25/2012  . Disorder of bladder function 07/25/2012  . Incomplete bladder emptying 07/25/2012  . Microscopic hematuria 07/25/2012   Janna Arch, PT, DPT   10/22/2017, 12:36 PM  [place] MAIN [facility] SERVICES [address] [place], [place], [address] Phone: [phone]   Fax:  [phone]  Name: Christina Gintz Kulish MRN: [id number] Date of Birth: 11-Jan-1961

## 2017-10-24 ENCOUNTER — Ambulatory Visit: Payer: PPO

## 2017-10-24 DIAGNOSIS — M6281 Muscle weakness (generalized): Secondary | ICD-10-CM

## 2017-10-24 DIAGNOSIS — R2689 Other abnormalities of gait and mobility: Secondary | ICD-10-CM

## 2017-10-24 DIAGNOSIS — R2681 Unsteadiness on feet: Secondary | ICD-10-CM

## 2017-10-24 NOTE — Therapy (Signed)
[place] MAIN [facility] SERVICES [address] [place], [place], [address] Phone: [phone]   Fax:  [phone]  Physical Therapy Treatment  Patient Details  Name: Lisa Williams MRN: [id number] Date of Birth: [birth year] Referring Provider: Gurney Maxin, MD   Encounter Date: 10/24/2017  PT End of Session - 10/24/17 1142    Visit Number  18    Number of Visits  26    Date for PT Re-Evaluation  11/14/17    PT Start Time  1114    PT Stop Time  1200    PT Time Calculation (min)  46 min    Equipment Utilized During Treatment  Gait belt    Activity Tolerance  Patient tolerated treatment well;Patient limited by fatigue;Other (comment)    Behavior During Therapy  WFL for tasks assessed/performed       Past Medical History:  Diagnosis Date  . Abdominal pain, right upper quadrant   . Back pain   . Calculus of kidney 12/09/2013  . Chronic back pain    unspecified  . Chronic left shoulder pain 07/19/2015  . Functional disorder of bladder    other  . Galactorrhea 11/26/2014   Chronic   . Hereditary and idiopathic neuropathy 08/19/2013  . HPV test positive   . Hypercholesteremia 08/19/2013  . Incomplete bladder emptying   . Microscopic hematuria   . MS (multiple sclerosis) ([place])   . Muscle spasticity 05/21/2014  . Nonspecific findings on examination of urine    other  . Osteopenia   . Status post laparoscopic supracervical hysterectomy 11/26/2014  . Tobacco user 11/26/2014  . Wrist fracture     Past Surgical History:  Procedure Laterality Date  . bilateral tubal ligation  1996  . BREAST CYST EXCISION Left 2002  . KNEE SURGERY     right  . LAPAROSCOPIC SUPRACERVICAL HYSTERECTOMY  08/05/2013  . ORIF WRIST FRACTURE Left 01/17/2017   Procedure: OPEN REDUCTION INTERNAL FIXATION (ORIF) WRIST FRACTURE;  Surgeon: Lovell Sheehan, MD;  Location: [facility] ORS;  Service: Orthopedics;  Laterality: Left;  . TUBAL LIGATION Bilateral   . VAGINAL  HYSTERECTOMY  03/2006    There were no vitals filed for this visit.  Subjective Assessment - 10/24/17 1134    Subjective  Patient reports compliance with HEP. Reports tomorrow is her mothers birthday. No stumbles or falls since last session.     Patient is accompained by:  Family member    Pertinent History  Pt is a 58 y/o F who presents with BLE weakness and difficulty ambulating due to h/o MS that was diagnosed in 1995. Pt denies any pain associated. Pt was admitted to the hospital in November 2018 due to MS exacerbation. Nov-Dec to HHPT-->[facility] until end of Jan. After rehab the pt was able to ambulate up to 100 ft with the RW. Pt has Bil AFO, her therapist at [facility] thinks she needs a new AFO as her feet still drag when she ambulates, pt reports HHPT mentioned a spring AFO. Pt has custom AFOs that were originally made 4 years ago. Goals: improve balance, to be able to get into the shower with her tub bench without assist, to be able to get up 2 steps at sister's home with railings on both sides but too far apart to reach both sides, to be able to drive adapted car. Pt is staying at other sister's house with one step to get inside and currently picking up L leg with UEs and  then stepping in. Pt steps down leading with LLE. Pt wants to work on walking side to side as this is challenging and she had not yet progressed to this at rehab. Pt able to get in/out the car with assist only to bring BLEs into the car. Hoping to drive by June or July with adapted car (already has one). Pt needs assist bringing LEs into and out of hospital bed. Pt able to perform sit>stand without assist from [facility] with a pillow on the seat for a boost. Pt denies any falls in the past 6 months. Pt has been increasing her time alone at home up to a few hours at a time to become more independent. Pt has both manual and power WC. Uses power WC at home, uses manual WC out in community. Pt able to self propel manual WC. Pt needs  assist putting pants around ankles but she can pull them up. Pt can put on her shoes but needs help getting her leg up to do so. Pt able to tie her shoelaces on her own. Pt has a tub/shower unit at home with a tub bench. No grab bars in the shower. Has hand held shower head. Pt currently taking a sponge bath. Pt has a regular height toilet with a BSC over top. Pt with h/o L wrist fx with no precautions, pt wore her soft brace to evaluation for comfort but reports her wrist has healed and is doing well following OT.     Limitations  Lifting;Standing;Walking;House hold activities    How long can you stand comfortably?  3 minutes without UE assist, limited by fatigue    How long can you walk comfortably?  100 ft, limited by fatigue    Patient Stated Goals  see above    Currently in Pain?  No/denies       Ambulation in // bars 10' x10 lengths, theraband utilized on BLE to assist DF, knee flexion, and hip flexion. Cues to avoid trunk flexion and to initiate step by moving pelvis forward.    Tic tac toe balance with SUE support : 4 minutes   Stepping forward over tape on floor with emphasis on floor clearance 1x10 each LE with BUE support      Standing hip abduction x 10 bilateral      Sit to stand from elevated seat with UE assist on // bars x5   Seated:  Balloon pass with PT to challenge patient reach within and outside of BOS for core activation. LE supported 3 minutes    Sit to stand: focus on primarily utilizing LE's with tactile cueing to RLE for support. Decreased need for assistance demonstrating carryover from previous session.   Patient educated on pelvic floor therapy with therapist coming to discuss options with patient.                        PT Education - 10/24/17 1135    Education provided  Yes    Education Details  exercise technique, LE body mechanics    Person(s) Educated  Patient    Methods  Explanation;Demonstration;Verbal cues    Comprehension   Verbalized understanding;Returned demonstration       PT Short Term Goals - 09/25/17 1714      PT SHORT TERM GOAL #1   Title  Pt will be completed HEP with min assist from caregiver at least 4 days/wk for improved carryover between sessions    Time  2  Period  Weeks    Status  New      PT SHORT TERM GOAL #2   Title  Patient will report a worst pain of 6/10 on VAS in cervical spine to improve tolerance with ADLs and reduced symptoms with activities.     Baseline  4/30: 9/10 5/21: 0/10 pain    Time  2    Period  Weeks    Status  Achieved      PT SHORT TERM GOAL #3   Title  Patient will perform cervical rotation at least 50 degrees, flexion and extension to 70 degrees, and sidebending to 40 degrees without pain    Baseline  4/30: R 50 L 34 Rotation, R 26 L 35 sb, Extension 35 flex 35 ; 5/21: extension 46, SB R 41 L 40, rotation R 62 L 61,     Time  2    Period  Weeks    Status  Achieved        PT Long Term Goals - 09/25/17 1716      PT LONG TERM GOAL #1   Title  Pt's 5xSTS will impove to equal to or below 2 minutes to demonstrate improved balance and BLE strength    Baseline  3.02 minutes; 5/15: 1 min 30 seconds    Time  6    Period  Weeks    Status  Achieved      PT LONG TERM GOAL #2   Title  Pt's 35mT will improve to at least 0.5 m/s for improved safety ambulating in home    Baseline  0.14 m/s; 5/15: .221m     Time  8    Period  Weeks    Status  Partially Met      PT LONG TERM GOAL #3   Title  Pt's Bil hip F strength will improve to at least 2+/5 BLEs for improved gait mechanics and safety    Baseline  L: 2/5, R: 1/5    Time  8    Period  Weeks    Status  New      PT LONG TERM GOAL #4   Title  Pt's ABC Scale will improve to at least 40% to demonstrate improved balance    Baseline  28.13% 5/15; 24.4%    Time  6    Period  Weeks    Status  New      PT LONG TERM GOAL #5   Title  Pt will be able to perform car transfer without assist to work towards pt's  goal of returning to driving    Baseline  only needs help with one leg to put onto running board to get into sisters car    Time  6    Period  Weeks    Status  Partially Met      PT LONG TERM GOAL #6   Title  Pt will be able to perform tub bench into/out of shower transfer without assist for improved independence with this task    Baseline  challenged with steps at this time, getting better, stands and gives sponge bath independently now     Time  6    Period  Weeks    Status  Partially Met      PT LONG TERM GOAL #7   Title  Patient will perform cervical rotation at least 60 degrees, flexion and extension to 70 degrees, and sidebending to 40 degrees without pain    Baseline  4/30:  R 50 L 34 Rotation, R 26 L 35 sb, Extension 35 flex 35; 5/21: extension 46, SB R 41 L 40, rotation R 62 L 61,     Time  4    Period  Weeks    Status  Achieved      PT LONG TERM GOAL #8   Title  Patient will improve NDI to <28% to reduce disability to mild  percieved disability for improved quality of life.     Baseline  4/30: 56% severe disability 5/21: 10%    Time  4    Period  Weeks    Status  Achieved      PT LONG TERM GOAL  #9   TITLE  Patient will report a worst pain of 3/10 on VAS in  cervical spine  to improve tolerance with ADLs and reduced symptoms with activities.     Baseline  4/30: 9/10 5/21: 0/10 in neck     Time  4    Period  Weeks    Status  Achieved      PT LONG TERM GOAL  #10   TITLE  Pt's 5xSTS will impove to equal to or below 50 seconds to demonstrate improved balance and BLE strength    Baseline  5/15: 1 min 30.9 seconds    Time  6    Period  Weeks    Status  New            Plan - 10/24/17 1224    Clinical Impression Statement  Patient demonstrates improved quadriceps muscle recruitment. Patient challenged with prolonged standing position fatiguing quickly requiring excessive UE support. Patient will continue to benefit from skilled PT to improve mobility and independence      Rehab Potential  Good    PT Frequency  2x / week    PT Duration  8 weeks    PT Treatment/Interventions  ADLs/Self Care Home Management;Aquatic Therapy;Biofeedback;Cryotherapy;Electrical Stimulation;Iontophoresis 27m/ml Dexamethasone;Traction;Moist Heat;Ultrasound;DME Instruction;Gait training;Stair training;Functional mobility training;Therapeutic activities;Therapeutic exercise;Balance training;Neuromuscular re-education;Patient/family education;Orthotic Fit/Training;Wheelchair mobility training;Manual techniques;Compression bandaging;Passive range of motion;Dry needling;Energy conservation;Taping;Splinting    PT Next Visit Plan  assess need for new AFOs or other orthotic to assist with ambulation, initiate strengthening program, practice and improve technique for bed mobility and sit<>stand    PT Home Exercise Plan  to initiate next session    Consulted and Agree with Plan of Care  Patient       Patient will benefit from skilled therapeutic intervention in order to improve the following deficits and impairments:  Abnormal gait, Decreased activity tolerance, Decreased balance, Decreased coordination, Decreased endurance, Decreased knowledge of use of DME, Decreased mobility, Decreased range of motion, Decreased safety awareness, Decreased strength, Difficulty walking, Impaired perceived functional ability, Impaired sensation, Impaired tone, Impaired UE functional use, Improper body mechanics, Postural dysfunction  Visit Diagnosis: Muscle weakness (generalized)  Other abnormalities of gait and mobility  Unsteadiness on feet     Problem List Patient Active Problem List   Diagnosis Date Noted  . Wrist fracture 01/16/2017  . Health care maintenance 01/24/2016  . Blood pressure elevated without history of HTN 10/25/2015  . Multiple sclerosis ([place] 10/02/2015  . Chronic left shoulder pain 07/19/2015  . Multiple sclerosis exacerbation ([place] 07/14/2015  . MS (multiple sclerosis) ([place]  11/26/2014  . Increased body mass index 11/26/2014  . HPV test positive 11/26/2014  . Status post laparoscopic supracervical hysterectomy 11/26/2014  . Galactorrhea 11/26/2014  . Tobacco user 11/26/2014  . Back ache 05/21/2014  . Adiposity  05/21/2014  . Disordered sleep 05/21/2014  . Muscle spasticity 05/21/2014  . Calculus of kidney 12/09/2013  . Renal colic 79/81/0254  . Hypercholesteremia 08/19/2013  . Hereditary and idiopathic neuropathy 08/19/2013  . Hypercholesterolemia without hypertriglyceridemia 08/19/2013  . Bladder infection, chronic 07/25/2012  . Disorder of bladder function 07/25/2012  . Incomplete bladder emptying 07/25/2012  . Microscopic hematuria 07/25/2012   Janna Arch, PT, DPT   10/24/2017, 12:25 PM  [place] MAIN [facility] SERVICES [address] [place], [place], [address] Phone: [phone]   Fax:  [phone]  Name: Lisa Williams MRN: [id number] Date of Birth: 06-Jan-1961

## 2017-10-27 DIAGNOSIS — G35 Multiple sclerosis: Secondary | ICD-10-CM | POA: Diagnosis not present

## 2017-10-29 ENCOUNTER — Ambulatory Visit: Payer: PPO

## 2017-10-30 ENCOUNTER — Ambulatory Visit: Payer: PPO

## 2017-10-30 DIAGNOSIS — M5412 Radiculopathy, cervical region: Secondary | ICD-10-CM

## 2017-10-30 DIAGNOSIS — M6281 Muscle weakness (generalized): Secondary | ICD-10-CM

## 2017-10-30 DIAGNOSIS — R2681 Unsteadiness on feet: Secondary | ICD-10-CM

## 2017-10-30 DIAGNOSIS — R2689 Other abnormalities of gait and mobility: Secondary | ICD-10-CM

## 2017-10-30 NOTE — Patient Instructions (Signed)
Stabilization: Diaphragmatic Breathing    Lie with knees bent, feet flat. Place one hand on stomach, other on chest. Breathe deeply through nose, lifting belly hand without any motion of hand on chest. Repeat __20__ times per set. Do _2___ sets per session. Do __7__ sessions per week.     You can find at [email or web address], bed,bath, and beyond or target.  Bowel retraining program: 1) Start by drinking a hot (optionally caffeinated) beverage 2) Do your "I love you" colonic massage  3) Go for a short walk 4) Go to the toilet and sit with feet up on squatty potty and relaxing forward on your knees with tall spine. Take deep, lengthening, breaths and allow up to 10 minutes to have a BM without "straining" before you move on with the day.    The "I Love You" massage for your colon  Start by resting or lying quietly. 1. Using your fingertips, you apply light pressure in a stroking motion. 2. Start with your hands on the left hand side of your abdomen, below the rib cage, and stroke or make small circles down towards your left hip. This is the "I" of the "I Love You" massage. 3. Next, you are going to make the strokes in an upside down "L" shape. Run your fingertips from the right side of your upper abdomen, across under your ribs, and down the left side. 4. Now you are going to run through the whole path. This is the "U". Start on the bottom right of your abdomen. Stroke up the right side, across under the rib cage, and down the left side.   5. Finally, Using your fingertips, you apply light pressure in small circles  through the whole "U" path to "wake up" the smooth muscles of the intestines and get things moving.    Up the right, across under the rib cage, down the left and inwards, moving in a clockwise motion (if you are looking down upon your own abdomen)  Essentially you are massaging along the path of your large intestine. Our colon starts roughly in the bottom right of our abdomen,  travels up the right hand side, turns and runs across below our rib cage, and then down the left side and in towards the pubic bone. When I teach this massage for people to do at home I have them start with 10 minutes. However, anecdotally, many people tell me 15-20 minutes really gets things going! After about 5 minutes of this massage my insides start gurgling and making noises. 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 A big problem is constipation resulting from either medication side effects, post surgical changes, or the fact that in general people in the hospital don't move as much (and exercise such as walking also helps regulate our digestive system). One of the first "exercises" I would teach them is how to do the "I Love You" abdominal massage. Time and time again I have people come back to me and say that massaging their abdominal tissue helped their digestive issues. Give it a try today!  *Adapted from article written by Harriet Butte, PT, DPT

## 2017-10-30 NOTE — Therapy (Signed)
[place] MAIN [facility] SERVICES [address] [place], [place], [address] Phone: [phone]   Fax:  [phone]  Physical Therapy Treatment  Patient Details  Name: Lisa Williams MRN: [id number] Date of Birth: [birth year] Referring Provider: Gurney Maxin, MD   Encounter Date: 10/30/2017  PT End of Session - 10/30/17 1616    Visit Number  19    Number of Visits  26    Date for PT Re-Evaluation  11/14/17    PT Start Time  1505    PT Stop Time  1605    PT Time Calculation (min)  60 min    Activity Tolerance  Patient tolerated treatment well;Patient limited by fatigue;Other (comment)    Behavior During Therapy  WFL for tasks assessed/performed       Past Medical History:  Diagnosis Date  . Abdominal pain, right upper quadrant   . Back pain   . Calculus of kidney 12/09/2013  . Chronic back pain    unspecified  . Chronic left shoulder pain 07/19/2015  . Functional disorder of bladder    other  . Galactorrhea 11/26/2014   Chronic   . Hereditary and idiopathic neuropathy 08/19/2013  . HPV test positive   . Hypercholesteremia 08/19/2013  . Incomplete bladder emptying   . Microscopic hematuria   . MS (multiple sclerosis) ([place])   . Muscle spasticity 05/21/2014  . Nonspecific findings on examination of urine    other  . Osteopenia   . Status post laparoscopic supracervical hysterectomy 11/26/2014  . Tobacco user 11/26/2014  . Wrist fracture     Past Surgical History:  Procedure Laterality Date  . bilateral tubal ligation  1996  . BREAST CYST EXCISION Left 2002  . KNEE SURGERY     right  . LAPAROSCOPIC SUPRACERVICAL HYSTERECTOMY  08/05/2013  . ORIF WRIST FRACTURE Left 01/17/2017   Procedure: OPEN REDUCTION INTERNAL FIXATION (ORIF) WRIST FRACTURE;  Surgeon: Lovell Sheehan, MD;  Location: [facility] ORS;  Service: Orthopedics;  Laterality: Left;  . TUBAL LIGATION Bilateral   . VAGINAL HYSTERECTOMY  03/2006    There were no vitals  filed for this visit.    Pelvic Floor Physical Therapy Treatment Note  SCREENING  Changes in medications, allergies, or medical history?: no   SUBJECTIVE  Patient reports: Has been having post-void residual volume after urinating or she has to  Has to pee at ~ 6,8, and 10.  Bowel movements are urgent and are firm, hard, and big. No hemroids, no pain with BM. Has a BM ~ 1 time per week and sometimes less. If taking a stool softener or laxative it will take 2-3 days before it works. Was walking a lot more before her fall, is very limited in movement now. Never has to push  Or strain to have a BM.  Was able to put her own pants on today without help, did a sponge bath and lotion independently.   Has a lumbar disk herniation that can occasionally cause pain  Precautions:  MS  Pain update:  Location of pain: Low back Current pain:  0/10  Max pain:  8/10 Least pain:  0/10 Nature of pain: achy  Patient Goals: Decrease urinary hesitancy, functional incontinence at the toilet and decrease constipation to decrease risk of fall and re-injury as well as improve quality of life.  OBJECTIVE  Changes in: Posture/Observations:  Pt stands with anterior pelvic tilt Pt spends a large amount of her day sitting.  Gait Analysis: Pt.  Tried to stand from wheelchair to transfer to table unsuccessfully 4 times, refusing assistance and stating that she had been doing well all day and does not know why she is having a hard time this afternoon.  INTERVENTIONS THIS SESSION: Self care: Educated on the structure and function of the pelvic floor in relation to their symptoms as well as the POC, and initial HEP in order to set patient expectations and understanding from which we will build on in the future sessions. Educated on the stress reaction for bowel and bladder emptying and how to use diaphragmatic breathing and self-talk to decrease the urge and allow for time to get to the toilet successfully.  Educated on how and why to use a squatty potty to help with decreasing constipation and the importance of having more regular bowel movements to decrease risk of serious health issues. Theract: educated on and practiced diaphragmatic breathing to lengthen PFM and decrease tension to allow for improved urine flow and ease of BM. Educated on and practiced self colonic massage and discussed how to use it as part of a bowel retraining program to decrease constipation.    Total time: 60 min.                          PT Education - 10/30/17 1615    Education provided  Yes    Education Details  See Pt. Instructions and Interventions this session.    Person(s) Educated  Patient    Methods  Explanation;Demonstration;Verbal cues;Handout    Comprehension  Verbalized understanding;Returned demonstration;Verbal cues required       PT Short Term Goals - 09/25/17 1714      PT SHORT TERM GOAL #1   Title  Pt will be completed HEP with min assist from caregiver at least 4 days/wk for improved carryover between sessions    Time  2    Period  Weeks    Status  New      PT SHORT TERM GOAL #2   Title  Patient will report a worst pain of 6/10 on VAS in cervical spine to improve tolerance with ADLs and reduced symptoms with activities.     Baseline  4/30: 9/10 5/21: 0/10 pain    Time  2    Period  Weeks    Status  Achieved      PT SHORT TERM GOAL #3   Title  Patient will perform cervical rotation at least 50 degrees, flexion and extension to 70 degrees, and sidebending to 40 degrees without pain    Baseline  4/30: R 50 L 34 Rotation, R 26 L 35 sb, Extension 35 flex 35 ; 5/21: extension 46, SB R 41 L 40, rotation R 62 L 61,     Time  2    Period  Weeks    Status  Achieved        PT Long Term Goals - 09/25/17 1716      PT LONG TERM GOAL #1   Title  Pt's 5xSTS will impove to equal to or below 2 minutes to demonstrate improved balance and BLE strength    Baseline  3.02 minutes;  5/15: 1 min 30 seconds    Time  6    Period  Weeks    Status  Achieved      PT LONG TERM GOAL #2   Title  Pt's 28mT will improve to at least 0.5 m/s for improved safety ambulating in home  Baseline  0.14 m/s; 5/15: .1ms     Time  8    Period  Weeks    Status  Partially Met      PT LONG TERM GOAL #3   Title  Pt's Bil hip F strength will improve to at least 2+/5 BLEs for improved gait mechanics and safety    Baseline  L: 2/5, R: 1/5    Time  8    Period  Weeks    Status  New      PT LONG TERM GOAL #4   Title  Pt's ABC Scale will improve to at least 40% to demonstrate improved balance    Baseline  28.13% 5/15; 24.4%    Time  6    Period  Weeks    Status  New      PT LONG TERM GOAL #5   Title  Pt will be able to perform car transfer without assist to work towards pt's goal of returning to driving    Baseline  only needs help with one leg to put onto running board to get into sisters car    Time  6    Period  Weeks    Status  Partially Met      PT LONG TERM GOAL #6   Title  Pt will be able to perform tub bench into/out of shower transfer without assist for improved independence with this task    Baseline  challenged with steps at this time, getting better, stands and gives sponge bath independently now     Time  6    Period  Weeks    Status  Partially Met      PT LONG TERM GOAL #7   Title  Patient will perform cervical rotation at least 60 degrees, flexion and extension to 70 degrees, and sidebending to 40 degrees without pain    Baseline  4/30: R 50 L 34 Rotation, R 26 L 35 sb, Extension 35 flex 35; 5/21: extension 46, SB R 41 L 40, rotation R 62 L 61,     Time  4    Period  Weeks    Status  Achieved      PT LONG TERM GOAL #8   Title  Patient will improve NDI to <28% to reduce disability to mild  percieved disability for improved quality of life.     Baseline  4/30: 56% severe disability 5/21: 10%    Time  4    Period  Weeks    Status  Achieved      PT LONG TERM  GOAL  #9   TITLE  Patient will report a worst pain of 3/10 on VAS in  cervical spine  to improve tolerance with ADLs and reduced symptoms with activities.     Baseline  4/30: 9/10 5/21: 0/10 in neck     Time  4    Period  Weeks    Status  Achieved      PT LONG TERM GOAL  #10   TITLE  Pt's 5xSTS will impove to equal to or below 50 seconds to demonstrate improved balance and BLE strength    Baseline  5/15: 1 min 30.9 seconds    Time  6    Period  Weeks    Status  New            Plan - 10/30/17 1616    Clinical Impression Statement  Pt. demonstrated understanding of techniques and education provided and  intention to implement reccomendations to decrease constipation and urgency as well as urinary hesitancy. Continue per POC.    Clinical Presentation  Evolving    Clinical Decision Making  Moderate    Rehab Potential  Good    PT Frequency  2x / week    PT Duration  8 weeks    PT Treatment/Interventions  ADLs/Self Care Home Management;Aquatic Therapy;Biofeedback;Cryotherapy;Electrical Stimulation;Iontophoresis 50m/ml Dexamethasone;Traction;Moist Heat;Ultrasound;DME Instruction;Gait training;Stair training;Functional mobility training;Therapeutic activities;Therapeutic exercise;Balance training;Neuromuscular re-education;Patient/family education;Orthotic Fit/Training;Wheelchair mobility training;Manual techniques;Compression bandaging;Passive range of motion;Dry needling;Energy conservation;Taping;Splinting    PT Next Visit Plan  assess need for new AFOs or other orthotic to assist with ambulation, initiate strengthening program, practice and improve technique for bed mobility and sit<>stand    PT Home Exercise Plan  to initiate next session (please incorporate hip flexor and adductor stretches in HEP if possible -KG)    Consulted and Agree with Plan of Care  Patient       Patient will benefit from skilled therapeutic intervention in order to improve the following deficits and  impairments:  Abnormal gait, Decreased activity tolerance, Decreased balance, Decreased coordination, Decreased endurance, Decreased knowledge of use of DME, Decreased mobility, Decreased range of motion, Decreased safety awareness, Decreased strength, Difficulty walking, Impaired perceived functional ability, Impaired sensation, Impaired tone, Impaired UE functional use, Improper body mechanics, Postural dysfunction  Visit Diagnosis: Muscle weakness (generalized)  Other abnormalities of gait and mobility  Unsteadiness on feet  Radiculopathy, cervical region     Problem List Patient Active Problem List   Diagnosis Date Noted  . Wrist fracture 01/16/2017  . Health care maintenance 01/24/2016  . Blood pressure elevated without history of HTN 10/25/2015  . Multiple sclerosis ([place] 10/02/2015  . Chronic left shoulder pain 07/19/2015  . Multiple sclerosis exacerbation ([place] 07/14/2015  . MS (multiple sclerosis) ([place] 11/26/2014  . Increased body mass index 11/26/2014  . HPV test positive 11/26/2014  . Status post laparoscopic supracervical hysterectomy 11/26/2014  . Galactorrhea 11/26/2014  . Tobacco user 11/26/2014  . Back ache 05/21/2014  . Adiposity 05/21/2014  . Disordered sleep 05/21/2014  . Muscle spasticity 05/21/2014  . Calculus of kidney 12/09/2013  . Renal colic 098/28/6751 . Hypercholesteremia 08/19/2013  . Hereditary and idiopathic neuropathy 08/19/2013  . Hypercholesterolemia without hypertriglyceridemia 08/19/2013  . Bladder infection, chronic 07/25/2012  . Disorder of bladder function 07/25/2012  . Incomplete bladder emptying 07/25/2012  . Microscopic hematuria 07/25/2012   KWilla RoughDPT, ATC KWilla Rough6/25/2019, 4:19 PM  [place] [facility] [address] [address]Phone: [phone]  Fax:  [phone] Name: JLoyce FlamingRamseur MRN: [id number] of Birth:  6Nov 09, 1962

## 2017-10-31 ENCOUNTER — Ambulatory Visit: Payer: PPO

## 2017-10-31 DIAGNOSIS — R2681 Unsteadiness on feet: Secondary | ICD-10-CM

## 2017-10-31 DIAGNOSIS — R2689 Other abnormalities of gait and mobility: Secondary | ICD-10-CM

## 2017-10-31 DIAGNOSIS — M6281 Muscle weakness (generalized): Secondary | ICD-10-CM | POA: Diagnosis not present

## 2017-10-31 NOTE — Therapy (Signed)
[place] MAIN [facility] SERVICES [address] [place], [place], [address] Phone: [phone]   Fax:  [phone]  Physical Therapy Treatment  Patient Details  Name: Lisa Williams MRN: [id number] Date of Birth: 06/05/1960 Referring Provider: Gurney Maxin, MD   Encounter Date: 10/31/2017  PT End of Session - 10/31/17 1210    Visit Number  20    Number of Visits  26    Date for PT Re-Evaluation  11/14/17    PT Start Time  1113    PT Stop Time  1200    PT Time Calculation (min)  47 min    Activity Tolerance  Patient tolerated treatment well;Patient limited by fatigue;Other (comment)    Behavior During Therapy  WFL for tasks assessed/performed       Past Medical History:  Diagnosis Date  . Abdominal pain, right upper quadrant   . Back pain   . Calculus of kidney 12/09/2013  . Chronic back pain    unspecified  . Chronic left shoulder pain 07/19/2015  . Functional disorder of bladder    other  . Galactorrhea 11/26/2014   Chronic   . Hereditary and idiopathic neuropathy 08/19/2013  . HPV test positive   . Hypercholesteremia 08/19/2013  . Incomplete bladder emptying   . Microscopic hematuria   . MS (multiple sclerosis) ([place])   . Muscle spasticity 05/21/2014  . Nonspecific findings on examination of urine    other  . Osteopenia   . Status post laparoscopic supracervical hysterectomy 11/26/2014  . Tobacco user 11/26/2014  . Wrist fracture     Past Surgical History:  Procedure Laterality Date  . bilateral tubal ligation  1996  . BREAST CYST EXCISION Left 2002  . KNEE SURGERY     right  . LAPAROSCOPIC SUPRACERVICAL HYSTERECTOMY  08/05/2013  . ORIF WRIST FRACTURE Left 01/17/2017   Procedure: OPEN REDUCTION INTERNAL FIXATION (ORIF) WRIST FRACTURE;  Surgeon: Lovell Sheehan, MD;  Location: [facility] ORS;  Service: Orthopedics;  Laterality: Left;  . TUBAL LIGATION Bilateral   . VAGINAL HYSTERECTOMY  03/2006    There were no vitals  filed for this visit.    Seated:  Balloon pass with PT to challenge patient reach within and outside of BOS for core activation. LE supported 3 minutes; 2 trials    Seated knee extension kicking soccer ball to increase reflexes and coordination with strengthening inputs. 2 sets of 10x each leg, demonstrated ability to perform without posterior trunk lean.   Seated throwing of basketballs ito hoop to promote core activation and stability x3 minutes   Sit to stand: focus on primarily utilizing LE's with tactile cueing to RLE for support. Focus on eccentric contraction down to prevent flop from raised plinth table. Squeeze gluteals for eccentric decent to decrease flop, RTB around knees for ascending to increase neutral knee alignment to to preference for internal rotation.  Decreased need for assistance demonstrating carryover from previous session. 2x 5                         PT Education - 10/31/17 1208    Education provided  Yes    Education Details  transfers, core activation, exercise technique     Person(s) Educated  Patient    Methods  Explanation;Demonstration;Verbal cues    Comprehension  Returned demonstration;Verbalized understanding       PT Short Term Goals - 09/25/17 1714      PT SHORT TERM  GOAL #1   Title  Pt will be completed HEP with min assist from caregiver at least 4 days/wk for improved carryover between sessions    Time  2    Period  Weeks    Status  New      PT SHORT TERM GOAL #2   Title  Patient will report a worst pain of 6/10 on VAS in cervical spine to improve tolerance with ADLs and reduced symptoms with activities.     Baseline  4/30: 9/10 5/21: 0/10 pain    Time  2    Period  Weeks    Status  Achieved      PT SHORT TERM GOAL #3   Title  Patient will perform cervical rotation at least 50 degrees, flexion and extension to 70 degrees, and sidebending to 40 degrees without pain    Baseline  4/30: R 50 L 34 Rotation, R 26 L 35 sb,  Extension 35 flex 35 ; 5/21: extension 46, SB R 41 L 40, rotation R 62 L 61,     Time  2    Period  Weeks    Status  Achieved        PT Long Term Goals - 09/25/17 1716      PT LONG TERM GOAL #1   Title  Pt's 5xSTS will impove to equal to or below 2 minutes to demonstrate improved balance and BLE strength    Baseline  3.02 minutes; 5/15: 1 min 30 seconds    Time  6    Period  Weeks    Status  Achieved      PT LONG TERM GOAL #2   Title  Pt's 46mT will improve to at least 0.5 m/s for improved safety ambulating in home    Baseline  0.14 m/s; 5/15: .261m     Time  8    Period  Weeks    Status  Partially Met      PT LONG TERM GOAL #3   Title  Pt's Bil hip F strength will improve to at least 2+/5 BLEs for improved gait mechanics and safety    Baseline  L: 2/5, R: 1/5    Time  8    Period  Weeks    Status  New      PT LONG TERM GOAL #4   Title  Pt's ABC Scale will improve to at least 40% to demonstrate improved balance    Baseline  28.13% 5/15; 24.4%    Time  6    Period  Weeks    Status  New      PT LONG TERM GOAL #5   Title  Pt will be able to perform car transfer without assist to work towards pt's goal of returning to driving    Baseline  only needs help with one leg to put onto running board to get into sisters car    Time  6    Period  Weeks    Status  Partially Met      PT LONG TERM GOAL #6   Title  Pt will be able to perform tub bench into/out of shower transfer without assist for improved independence with this task    Baseline  challenged with steps at this time, getting better, stands and gives sponge bath independently now     Time  6    Period  Weeks    Status  Partially Met      PT LONG TERM  GOAL #7   Title  Patient will perform cervical rotation at least 60 degrees, flexion and extension to 70 degrees, and sidebending to 40 degrees without pain    Baseline  4/30: R 50 L 34 Rotation, R 26 L 35 sb, Extension 35 flex 35; 5/21: extension 46, SB R 41 L 40,  rotation R 62 L 61,     Time  4    Period  Weeks    Status  Achieved      PT LONG TERM GOAL #8   Title  Patient will improve NDI to <28% to reduce disability to mild  percieved disability for improved quality of life.     Baseline  4/30: 56% severe disability 5/21: 10%    Time  4    Period  Weeks    Status  Achieved      PT LONG TERM GOAL  #9   TITLE  Patient will report a worst pain of 3/10 on VAS in  cervical spine  to improve tolerance with ADLs and reduced symptoms with activities.     Baseline  4/30: 9/10 5/21: 0/10 in neck     Time  4    Period  Weeks    Status  Achieved      PT LONG TERM GOAL  #10   TITLE  Pt's 5xSTS will impove to equal to or below 50 seconds to demonstrate improved balance and BLE strength    Baseline  5/15: 1 min 30.9 seconds    Time  6    Period  Weeks    Status  New            Plan - 10/31/17 1211    Clinical Impression Statement  Patient demonstrated ability to recruit quadriceps muscles with decreased need of posterior trunk lean compensatory patterning indicating strengthening of LEs. Patient challenged by eccentric patterning of stand to sit resulting in frequent "flops", gluteal squeezing upon descent improving control. Patient will continue to benefit from skilled PT to improve mobility and independence     Rehab Potential  Good    PT Frequency  2x / week    PT Duration  8 weeks    PT Treatment/Interventions  ADLs/Self Care Home Management;Aquatic Therapy;Biofeedback;Cryotherapy;Electrical Stimulation;Iontophoresis 58m/ml Dexamethasone;Traction;Moist Heat;Ultrasound;DME Instruction;Gait training;Stair training;Functional mobility training;Therapeutic activities;Therapeutic exercise;Balance training;Neuromuscular re-education;Patient/family education;Orthotic Fit/Training;Wheelchair mobility training;Manual techniques;Compression bandaging;Passive range of motion;Dry needling;Energy conservation;Taping;Splinting    PT Next Visit Plan  assess  need for new AFOs or other orthotic to assist with ambulation, initiate strengthening program, practice and improve technique for bed mobility and sit<>stand    PT Home Exercise Plan  to initiate next session (please incorporate hip flexor and adductor stretches in HEP if possible -KG)    Consulted and Agree with Plan of Care  Patient       Patient will benefit from skilled therapeutic intervention in order to improve the following deficits and impairments:  Abnormal gait, Decreased activity tolerance, Decreased balance, Decreased coordination, Decreased endurance, Decreased knowledge of use of DME, Decreased mobility, Decreased range of motion, Decreased safety awareness, Decreased strength, Difficulty walking, Impaired perceived functional ability, Impaired sensation, Impaired tone, Impaired UE functional use, Improper body mechanics, Postural dysfunction  Visit Diagnosis: Muscle weakness (generalized)  Other abnormalities of gait and mobility  Unsteadiness on feet     Problem List Patient Active Problem List   Diagnosis Date Noted  . Wrist fracture 01/16/2017  . Health care maintenance 01/24/2016  . Blood pressure elevated without history of  HTN 10/25/2015  . Multiple sclerosis ([place]) 10/02/2015  . Chronic left shoulder pain 07/19/2015  . Multiple sclerosis exacerbation ([place]) 07/14/2015  . MS (multiple sclerosis) ([place]) 11/26/2014  . Increased body mass index 11/26/2014  . HPV test positive 11/26/2014  . Status post laparoscopic supracervical hysterectomy 11/26/2014  . Galactorrhea 11/26/2014  . Tobacco user 11/26/2014  . Back ache 05/21/2014  . Adiposity 05/21/2014  . Disordered sleep 05/21/2014  . Muscle spasticity 05/21/2014  . Calculus of kidney 12/09/2013  . Renal colic 00/71/2197  . Hypercholesteremia 08/19/2013  . Hereditary and idiopathic neuropathy 08/19/2013  . Hypercholesterolemia without hypertriglyceridemia 08/19/2013  . Bladder infection, chronic 07/25/2012   . Disorder of bladder function 07/25/2012  . Incomplete bladder emptying 07/25/2012  . Microscopic hematuria 07/25/2012  Janna Arch, PT, DPT   10/31/2017, 12:12 PM  [place] MAIN [facility] SERVICES [address] [place], [place], [address] Phone: [phone]   Fax:  [phone]  Name: Alyah Boehning Hamil MRN: [id number] Date of Birth: [birth year]

## 2017-11-05 ENCOUNTER — Ambulatory Visit: Payer: PPO | Attending: Neurology

## 2017-11-05 DIAGNOSIS — M6281 Muscle weakness (generalized): Secondary | ICD-10-CM

## 2017-11-05 DIAGNOSIS — R2681 Unsteadiness on feet: Secondary | ICD-10-CM

## 2017-11-05 DIAGNOSIS — R2689 Other abnormalities of gait and mobility: Secondary | ICD-10-CM | POA: Diagnosis not present

## 2017-11-05 DIAGNOSIS — M5412 Radiculopathy, cervical region: Secondary | ICD-10-CM | POA: Diagnosis not present

## 2017-11-05 NOTE — Therapy (Signed)
[place] MAIN [facility] SERVICES [place], [place], [address] Phone: [phone]   Fax:  [phone]  Physical Therapy Treatment  Patient Details  Name: Lisa Williams MRN: [id number] Date of Birth: 1960/11/19 Referring Provider: Gurney Maxin, MD   Encounter Date: 11/05/2017  PT End of Session - 11/05/17 1140    Visit Number  21    Number of Visits  26    Date for PT Re-Evaluation  11/14/17    PT Start Time  1125    PT Stop Time  1205    PT Time Calculation (min)  40 min    Activity Tolerance  Patient tolerated treatment well;Patient limited by fatigue;Other (comment)    Behavior During Therapy  WFL for tasks assessed/performed       Past Medical History:  Diagnosis Date  . Abdominal pain, right upper quadrant   . Back pain   . Calculus of kidney 12/09/2013  . Chronic back pain    unspecified  . Chronic left shoulder pain 07/19/2015  . Functional disorder of bladder    other  . Galactorrhea 11/26/2014   Chronic   . Hereditary and idiopathic neuropathy 08/19/2013  . HPV test positive   . Hypercholesteremia 08/19/2013  . Incomplete bladder emptying   . Microscopic hematuria   . MS (multiple sclerosis) ([place])   . Muscle spasticity 05/21/2014  . Nonspecific findings on examination of urine    other  . Osteopenia   . Status post laparoscopic supracervical hysterectomy 11/26/2014  . Tobacco user 11/26/2014  . Wrist fracture     Past Surgical History:  Procedure Laterality Date  . bilateral tubal ligation  1996  . BREAST CYST EXCISION Left 2002  . KNEE SURGERY     right  . LAPAROSCOPIC SUPRACERVICAL HYSTERECTOMY  08/05/2013  . ORIF WRIST FRACTURE Left 01/17/2017   Procedure: OPEN REDUCTION INTERNAL FIXATION (ORIF) WRIST FRACTURE;  Surgeon: Lovell Sheehan, MD;  Location: [facility] ORS;  Service: Orthopedics;  Laterality: Left;  . TUBAL LIGATION Bilateral   . VAGINAL HYSTERECTOMY  03/2006    There were no vitals filed  for this visit.  Subjective Assessment - 11/05/17 1129    Subjective  Patient going to orthotist tomorrow for new braces. No complaints or concerns today.     Patient is accompained by:  Family member    Pertinent History  Pt is a 57 y/o F who presents with BLE weakness and difficulty ambulating due to h/o MS that was diagnosed in 1995. Pt denies any pain associated. Pt was admitted to the hospital in November 2018 due to MS exacerbation. Nov-Dec to HHPT-->[facility] until end of Jan. After rehab the pt was able to ambulate up to 100 ft with the RW. Pt has Bil AFO, her therapist at [facility] thinks she needs a new AFO as her feet still drag when she ambulates, pt reports HHPT mentioned a spring AFO. Pt has custom AFOs that were originally made 4 years ago. Goals: improve balance, to be able to get into the shower with her tub bench without assist, to be able to get up 2 steps at sister's home with railings on both sides but too far apart to reach both sides, to be able to drive adapted car. Pt is staying at other sister's house with one step to get inside and currently picking up L leg with UEs and then stepping in. Pt steps down leading with LLE. Pt wants to work on walking  side to side as this is challenging and she had not yet progressed to this at rehab. Pt able to get in/out the car with assist only to bring BLEs into the car. Hoping to drive by June or July with adapted car (already has one). Pt needs assist bringing LEs into and out of hospital bed. Pt able to perform sit>stand without assist from [facility] with a pillow on the seat for a boost. Pt denies any falls in the past 6 months. Pt has been increasing her time alone at home up to a few hours at a time to become more independent. Pt has both manual and power WC. Uses power WC at home, uses manual WC out in community. Pt able to self propel manual WC. Pt needs assist putting pants around ankles but she can pull them up. Pt can put on her shoes but  needs help getting her leg up to do so. Pt able to tie her shoelaces on her own. Pt has a tub/shower unit at home with a tub bench. No grab bars in the shower. Has hand held shower head. Pt currently taking a sponge bath. Pt has a regular height toilet with a BSC over top. Pt with h/o L wrist fx with no precautions, pt wore her soft brace to evaluation for comfort but reports her wrist has healed and is doing well following OT.     Limitations  Lifting;Standing;Walking;House hold activities    How long can you stand comfortably?  3 minutes without UE assist, limited by fatigue    How long can you walk comfortably?  100 ft, limited by fatigue    Patient Stated Goals  see above    Currently in Pain?  No/denies       Ambulation in // bars 10' x4 lengths, theraband utilized on BLE to assist DF, knee flexion, and hip flexion. Cues to avoid trunk flexion and to initiate step by moving pelvis forward.   March 10x each leg BUE support; 2 sets    Stepping forward over tape on floor with emphasis on floor clearance 1x10 each LE with BUE support; able to support clear with RLE without foot drag      Sit to stand from elevated seat with UE assist on // bars x5    Seated:  Balloon pass with PT to challenge patient reach within and outside of BOS for core activation. LE supported 3 minutes   Seated knee extensions, require Min-mod A to provide superior lift of leg for patient to return to starting position, hamstring activation      Sit to stand: focus on primarily utilizing LE's with tactile cueing to RLE for support. Decreased need for assistance demonstrating carryover from previous session.                         PT Education - 11/05/17 1139    Education provided  Yes    Education Details  ambulation and exercise technique     Person(s) Educated  Patient    Methods  Explanation;Demonstration;Verbal cues    Comprehension  Verbalized understanding;Returned demonstration        PT Short Term Goals - 09/25/17 1714      PT SHORT TERM GOAL #1   Title  Pt will be completed HEP with min assist from caregiver at least 4 days/wk for improved carryover between sessions    Time  2    Period  Weeks  Status  New      PT SHORT TERM GOAL #2   Title  Patient will report a worst pain of 6/10 on VAS in cervical spine to improve tolerance with ADLs and reduced symptoms with activities.     Baseline  4/30: 9/10 5/21: 0/10 pain    Time  2    Period  Weeks    Status  Achieved      PT SHORT TERM GOAL #3   Title  Patient will perform cervical rotation at least 50 degrees, flexion and extension to 70 degrees, and sidebending to 40 degrees without pain    Baseline  4/30: R 50 L 34 Rotation, R 26 L 35 sb, Extension 35 flex 35 ; 5/21: extension 46, SB R 41 L 40, rotation R 62 L 61,     Time  2    Period  Weeks    Status  Achieved        PT Long Term Goals - 09/25/17 1716      PT LONG TERM GOAL #1   Title  Pt's 5xSTS will impove to equal to or below 2 minutes to demonstrate improved balance and BLE strength    Baseline  3.02 minutes; 5/15: 1 min 30 seconds    Time  6    Period  Weeks    Status  Achieved      PT LONG TERM GOAL #2   Title  Pt's 66mT will improve to at least 0.5 m/s for improved safety ambulating in home    Baseline  0.14 m/s; 5/15: .280m     Time  8    Period  Weeks    Status  Partially Met      PT LONG TERM GOAL #3   Title  Pt's Bil hip F strength will improve to at least 2+/5 BLEs for improved gait mechanics and safety    Baseline  L: 2/5, R: 1/5    Time  8    Period  Weeks    Status  New      PT LONG TERM GOAL #4   Title  Pt's ABC Scale will improve to at least 40% to demonstrate improved balance    Baseline  28.13% 5/15; 24.4%    Time  6    Period  Weeks    Status  New      PT LONG TERM GOAL #5   Title  Pt will be able to perform car transfer without assist to work towards pt's goal of returning to driving    Baseline  only needs  help with one leg to put onto running board to get into sisters car    Time  6    Period  Weeks    Status  Partially Met      PT LONG TERM GOAL #6   Title  Pt will be able to perform tub bench into/out of shower transfer without assist for improved independence with this task    Baseline  challenged with steps at this time, getting better, stands and gives sponge bath independently now     Time  6    Period  Weeks    Status  Partially Met      PT LONG TERM GOAL #7   Title  Patient will perform cervical rotation at least 60 degrees, flexion and extension to 70 degrees, and sidebending to 40 degrees without pain    Baseline  4/30: R 50 L 34 Rotation, R  26 L 35 sb, Extension 35 flex 35; 5/21: extension 46, SB R 41 L 40, rotation R 62 L 61,     Time  4    Period  Weeks    Status  Achieved      PT LONG TERM GOAL #8   Title  Patient will improve NDI to <28% to reduce disability to mild  percieved disability for improved quality of life.     Baseline  4/30: 56% severe disability 5/21: 10%    Time  4    Period  Weeks    Status  Achieved      PT LONG TERM GOAL  #9   TITLE  Patient will report a worst pain of 3/10 on VAS in  cervical spine  to improve tolerance with ADLs and reduced symptoms with activities.     Baseline  4/30: 9/10 5/21: 0/10 in neck     Time  4    Period  Weeks    Status  Achieved      PT LONG TERM GOAL  #10   TITLE  Pt's 5xSTS will impove to equal to or below 50 seconds to demonstrate improved balance and BLE strength    Baseline  5/15: 1 min 30.9 seconds    Time  6    Period  Weeks    Status  New            Plan - 11/05/17 1227    Clinical Impression Statement  Patient demonstrated improved sit to stand transfer with increased utilization of LE decreasing dependence upon UEs. LLE continues to require assistance for dressing but is improving per patient report. Right leg demonstrated improved clearance and stability when performing step over drill. Patient  will continue to benefit from skilled PT to improve mobility and independence     Rehab Potential  Good    PT Frequency  2x / week    PT Duration  8 weeks    PT Treatment/Interventions  ADLs/Self Care Home Management;Aquatic Therapy;Biofeedback;Cryotherapy;Electrical Stimulation;Iontophoresis '4mg'$ /ml Dexamethasone;Traction;Moist Heat;Ultrasound;DME Instruction;Gait training;Stair training;Functional mobility training;Therapeutic activities;Therapeutic exercise;Balance training;Neuromuscular re-education;Patient/family education;Orthotic Fit/Training;Wheelchair mobility training;Manual techniques;Compression bandaging;Passive range of motion;Dry needling;Energy conservation;Taping;Splinting    PT Next Visit Plan  assess need for new AFOs or other orthotic to assist with ambulation, initiate strengthening program, practice and improve technique for bed mobility and sit<>stand    PT Home Exercise Plan  to initiate next session (please incorporate hip flexor and adductor stretches in HEP if possible -KG)    Consulted and Agree with Plan of Care  Patient       Patient will benefit from skilled therapeutic intervention in order to improve the following deficits and impairments:  Abnormal gait, Decreased activity tolerance, Decreased balance, Decreased coordination, Decreased endurance, Decreased knowledge of use of DME, Decreased mobility, Decreased range of motion, Decreased safety awareness, Decreased strength, Difficulty walking, Impaired perceived functional ability, Impaired sensation, Impaired tone, Impaired UE functional use, Improper body mechanics, Postural dysfunction  Visit Diagnosis: Muscle weakness (generalized)  Other abnormalities of gait and mobility  Unsteadiness on feet     Problem List Patient Active Problem List   Diagnosis Date Noted  . Wrist fracture 01/16/2017  . Health care maintenance 01/24/2016  . Blood pressure elevated without history of HTN 10/25/2015  . Multiple  sclerosis ([place]) 10/02/2015  . Chronic left shoulder pain 07/19/2015  . Multiple sclerosis exacerbation ([place]) 07/14/2015  . MS (multiple sclerosis) ([place]) 11/26/2014  . Increased body mass index 11/26/2014  .  HPV test positive 11/26/2014  . Status post laparoscopic supracervical hysterectomy 11/26/2014  . Galactorrhea 11/26/2014  . Tobacco user 11/26/2014  . Back ache 05/21/2014  . Adiposity 05/21/2014  . Disordered sleep 05/21/2014  . Muscle spasticity 05/21/2014  . Calculus of kidney 12/09/2013  . Renal colic 98/42/1031  . Hypercholesteremia 08/19/2013  . Hereditary and idiopathic neuropathy 08/19/2013  . Hypercholesterolemia without hypertriglyceridemia 08/19/2013  . Bladder infection, chronic 07/25/2012  . Disorder of bladder function 07/25/2012  . Incomplete bladder emptying 07/25/2012  . Microscopic hematuria 07/25/2012   Janna Arch, PT, DPT   11/05/2017, 12:28 PM  [place] MAIN [facility] SERVICES [address] [place], [place], [address] Phone: [phone]   Fax:  [phone]  Name: Lisa Williams MRN: [id number] Date of Birth: 10-04-1960

## 2017-11-06 ENCOUNTER — Other Ambulatory Visit: Payer: Self-pay | Admitting: *Deleted

## 2017-11-06 DIAGNOSIS — G35 Multiple sclerosis: Secondary | ICD-10-CM

## 2017-11-07 ENCOUNTER — Inpatient Hospital Stay: Payer: PPO

## 2017-11-07 ENCOUNTER — Inpatient Hospital Stay: Payer: PPO | Attending: Hematology and Oncology | Admitting: Hematology and Oncology

## 2017-11-07 ENCOUNTER — Encounter: Payer: Self-pay | Admitting: Hematology and Oncology

## 2017-11-07 VITALS — BP 163/89 | HR 81 | Resp 20

## 2017-11-07 VITALS — BP 157/79 | HR 67 | Temp 96.4°F | Resp 20 | Wt 260.1 lb

## 2017-11-07 DIAGNOSIS — G35 Multiple sclerosis: Secondary | ICD-10-CM | POA: Diagnosis not present

## 2017-11-07 DIAGNOSIS — Z87442 Personal history of urinary calculi: Secondary | ICD-10-CM | POA: Diagnosis not present

## 2017-11-07 DIAGNOSIS — M549 Dorsalgia, unspecified: Secondary | ICD-10-CM | POA: Diagnosis not present

## 2017-11-07 DIAGNOSIS — Z79899 Other long term (current) drug therapy: Secondary | ICD-10-CM | POA: Insufficient documentation

## 2017-11-07 DIAGNOSIS — E7800 Pure hypercholesterolemia, unspecified: Secondary | ICD-10-CM | POA: Diagnosis not present

## 2017-11-07 DIAGNOSIS — Z87891 Personal history of nicotine dependence: Secondary | ICD-10-CM | POA: Insufficient documentation

## 2017-11-07 DIAGNOSIS — R5383 Other fatigue: Secondary | ICD-10-CM | POA: Diagnosis not present

## 2017-11-07 DIAGNOSIS — M8580 Other specified disorders of bone density and structure, unspecified site: Secondary | ICD-10-CM

## 2017-11-07 MED ORDER — RITUXIMAB CHEMO INJECTION 500 MG/50ML
425.0000 mg/m2 | Freq: Once | INTRAVENOUS | Status: AC
  Administered 2017-11-07: 1000 mg via INTRAVENOUS
  Filled 2017-11-07: qty 100

## 2017-11-07 MED ORDER — DIPHENHYDRAMINE HCL 25 MG PO CAPS
50.0000 mg | ORAL_CAPSULE | Freq: Once | ORAL | Status: AC
  Administered 2017-11-07: 50 mg via ORAL
  Filled 2017-11-07: qty 2

## 2017-11-07 MED ORDER — SODIUM CHLORIDE 0.9 % IV SOLN
Freq: Once | INTRAVENOUS | Status: AC
Start: 2017-11-07 — End: 2017-11-07
  Administered 2017-11-07: 10:00:00 via INTRAVENOUS
  Filled 2017-11-07: qty 1000

## 2017-11-07 MED ORDER — ACETAMINOPHEN 500 MG PO TABS
1000.0000 mg | ORAL_TABLET | Freq: Once | ORAL | Status: AC
  Administered 2017-11-07: 1000 mg via ORAL
  Filled 2017-11-07: qty 2

## 2017-11-07 MED ORDER — METHYLPREDNISOLONE SODIUM SUCC 125 MG IJ SOLR
100.0000 mg | Freq: Once | INTRAMUSCULAR | Status: AC
  Administered 2017-11-07: 100 mg via INTRAVENOUS
  Filled 2017-11-07: qty 2

## 2017-11-07 NOTE — Progress Notes (Signed)
Patient offers no complaints today. 

## 2017-11-07 NOTE — Progress Notes (Signed)
[facility] day:  11/07/2017  Chief Complaint: Lisa Williams is a 57 y.o. female with multiple sclerosis who seen for 6 month assessment prior to cycle #4 Rituxan.  HPI:  Patient was last seen in the hematology clinic on 05/09/2017.  At that time, she had lower extremity weakness. She was at [facility] for rehabilitation.  She received week #3 Rituxan.  She saw Dr. Melrose Nakayama on 10/08/2017.  Her MS was stable.  Recommendations were for continuing Rituxan infusions to control MS symptoms.  She was to schedule Rituxan infusions every 7 months after her next appointment.  Consideration was being made to extend Rituxan infusions after 2-3 years of treatment.  CBC on 10/08/2017 revealed a hematocrit of 37.3, hemoglobin 12.9, MCV 77.4, platelets 234,000, WBC 7900 with an [place] of 4970.  CMP was normal except for an alkaline phosphatase of 115.  During the interim, patient is doing better. She is back at home and participating in outpatient physical therapy. She is ambulating. She notes that she is back to "about 80%" of her PLOF (previously 30%). Patient denies an MS related symptoms at present. Her strength and coordination are "improving every day".   Patient denies that she has experienced any B symptoms. She denies any interval infections. Patient advises that she maintains an adequate appetite. She is eating well. Weight today is 260 lb 1 oz (118 kg), which compared to her last visit to the clinic, represents a  28 pound increase. Patient stood for weight check today.   Patient denies pain in the clinic today.   Past Medical History:  Diagnosis Date  . Abdominal pain, right upper quadrant   . Back pain   . Calculus of kidney 12/09/2013  . Chronic back pain    unspecified  . Chronic left shoulder pain 07/19/2015  . Functional disorder of bladder    other  . Galactorrhea 11/26/2014   Chronic   . Hereditary and idiopathic  neuropathy 08/19/2013  . HPV test positive   . Hypercholesteremia 08/19/2013  . Incomplete bladder emptying   . Microscopic hematuria   . MS (multiple sclerosis) ([place])   . Muscle spasticity 05/21/2014  . Nonspecific findings on examination of urine    other  . Osteopenia   . Status post laparoscopic supracervical hysterectomy 11/26/2014  . Tobacco user 11/26/2014  . Wrist fracture     Past Surgical History:  Procedure Laterality Date  . bilateral tubal ligation  1996  . BREAST CYST EXCISION Left 2002  . KNEE SURGERY     right  . LAPAROSCOPIC SUPRACERVICAL HYSTERECTOMY  08/05/2013  . ORIF WRIST FRACTURE Left 01/17/2017   Procedure: OPEN REDUCTION INTERNAL FIXATION (ORIF) WRIST FRACTURE;  Surgeon: Lovell Sheehan, MD;  Location: [facility] ORS;  Service: Orthopedics;  Laterality: Left;  . TUBAL LIGATION Bilateral   . VAGINAL HYSTERECTOMY  03/2006    Family History  Problem Relation Age of Onset  . Sickle cell trait Sister   . Hypertension Sister   . Supraventricular tachycardia Sister   . Hypertension Sister   . Diabetes Maternal Grandmother   . Liver cancer Maternal Grandmother   . Diabetes Maternal Aunt   . Breast cancer Maternal Aunt        great MAT  . Ovarian cancer Paternal Grandmother   . Nephrolithiasis Father   . Hypertension Father   . Prostate cancer Father   . Kidney disease Father   . Heart failure Father   .  Hypertension Sister   . Osteoarthritis Mother   . Hypertension Mother   . Hyperlipidemia Mother   . GU problems Neg Hx   . Urolithiasis Neg Hx     Social History:  reports that she has quit smoking. Her smoking use included cigarettes. She smoked 0.50 packs per day. She has never used smokeless tobacco. She reports that she does not drink alcohol or use drugs.  She lives in [place].  She is disabled secondary to her multiple sclerosis.  She previously worked as a Wellsite geologist in Therapist, art for [employer].  She has been in the [facility] since 04/23/2017.  The patient is alone today.  Allergies:  Allergies  Allergen Reactions  . Amlodipine Other (See Comments)    edema  . Povidone Iodine Rash    Current Medications: Current Outpatient Medications  Medication Sig Dispense Refill  . acetaminophen (TYLENOL) 325 MG tablet Take 650 mg by mouth every 8 (eight) hours as needed. Do not exceed 3000 mg in 24 hrs    . baclofen (LIORESAL) 20 MG tablet Take 20 mg by mouth every 8 (eight) hours as needed for muscle spasms.     . Calcium Carb-Cholecalciferol (CALCIUM 600+D) 600-800 MG-UNIT TABS Take 1 tablet by mouth daily.    . meloxicam (MOBIC) 15 MG tablet Take 1 tablet (15 mg total) by mouth 2 (two) times daily as needed for pain.  2  . Multiple Vitamin (MULTIVITAMIN) tablet Take 1 tablet by mouth daily.    Marland Kitchen oxybutynin (DITROPAN) 5 MG tablet Take 1 tablet (5 mg total) by mouth 2 (two) times daily.    Marland Kitchen RITUXIMAB IV Pt uses every 6 months due October 2018    . telmisartan (MICARDIS) 40 MG tablet Take 40 mg by mouth daily.     No current facility-administered medications for this visit.     Review of Systems  Constitutional: Negative for diaphoresis, fever, malaise/fatigue and weight loss (weight up 28 pounds).       "I feel so much better". At 80% PLOF (previously 30%).  HENT: Negative.   Eyes: Negative.   Respiratory: Negative for cough, hemoptysis, sputum production and shortness of breath.   Cardiovascular: Negative for chest pain, palpitations, orthopnea, leg swelling and PND.  Gastrointestinal: Negative for abdominal pain, blood in stool, constipation, diarrhea, melena, nausea and vomiting.  Genitourinary: Negative for dysuria, frequency, hematuria and urgency.  Musculoskeletal: Negative for back pain, falls, joint pain and myalgias.  Skin: Negative for itching and rash.  Neurological: Positive for weakness (mainly lower extremities related to MS). Negative for dizziness, tremors and headaches.       Multiple  sclerosis  Endo/Heme/Allergies: Does not bruise/bleed easily.  Psychiatric/Behavioral: Negative for depression, memory loss and suicidal ideas. The patient is not nervous/anxious and does not have insomnia.   All other systems reviewed and are negative.  Performance status (ECOG): 3 - Symptomatic, >50% confined to bed  Physical Exam: Blood pressure (!) 157/79, pulse 67, temperature (!) 96.4 F (35.8 C), temperature source Tympanic, resp. rate 20, weight 260 lb 1 oz (118 kg). GENERAL:  Well developed, well nourished, woman sitting comfortably in the exam room in a wheelchair in no acute distress. MENTAL STATUS:  Alert and oriented to person, place and time. HEAD:  Short brown hair.  Normocephalic, atraumatic, face symmetric, no Cushingoid features. EYES:  Glasses.  Brown eyes.  Pupils equal round and reactive to light and accomodation.  No conjunctivitis or scleral icterus. ENT:  Oropharynx  clear without lesion.  Tongue normal. Mucous membranes moist.  RESPIRATORY:  Clear to auscultation without rales, wheezes or rhonchi. CARDIOVASCULAR:  Regular rate and rhythm without murmur, rub or gallop. ABDOMEN:  Soft, non-tender, with active bowel sounds, and no hepatosplenomegaly.  No masses. SKIN:  No rashes, ulcers or lesions. EXTREMITIES: No edema, no skin discoloration or tenderness.  No palpable cords. LYMPH NODES: No palpable cervical, supraclavicular, axillary or inguinal adenopathy  NEUROLOGICAL: Alert & oriented, cranial nerves II-XII intact; motor strength 5/5upper extremities.  Able to lift distal lower extremities against gravity.  Sensation intact.   Finger to nose and RAM normal. PSYCH:  Appropriate.    No visits with results within 3 Day(s) from this visit.  Latest known visit with results is:  Admission on 03/05/2017, Discharged on 03/14/2017  Component Date Value Ref Range Status  . Sodium 03/05/2017 138  135 - 145 mmol/L Final  . Potassium 03/05/2017 4.4  3.5 - 5.1 mmol/L Final   . Chloride 03/05/2017 105  101 - 111 mmol/L Final  . CO2 03/05/2017 25  22 - 32 mmol/L Final  . Glucose, Bld 03/05/2017 112* 65 - 99 mg/dL Final  . BUN 03/05/2017 26* 6 - 20 mg/dL Final  . Creatinine, Ser 03/05/2017 0.72  0.44 - 1.00 mg/dL Final  . Calcium 03/05/2017 9.5  8.9 - 10.3 mg/dL Final  . GFR calc non Af Amer 03/05/2017 >60  >60 mL/min Final  . GFR calc Af Amer 03/05/2017 >60  >60 mL/min Final   Comment: (NOTE) The eGFR has been calculated using the CKD EPI equation. This calculation has not been validated in all clinical situations. eGFR's persistently <60 mL/min signify possible Chronic Kidney Disease.   . Anion gap 03/05/2017 8  5 - 15 Final  . WBC 03/05/2017 8.8  3.6 - 11.0 K/uL Final  . RBC 03/05/2017 4.88  3.80 - 5.20 MIL/uL Final  . Hemoglobin 03/05/2017 13.0  12.0 - 16.0 g/dL Final  . HCT 03/05/2017 38.6  35.0 - 47.0 % Final  . MCV 03/05/2017 79.1* 80.0 - 100.0 fL Final  . MCH 03/05/2017 26.7  26.0 - 34.0 pg Final  . MCHC 03/05/2017 33.8  32.0 - 36.0 g/dL Final  . RDW 03/05/2017 12.9  11.5 - 14.5 % Final  . Platelets 03/05/2017 232  150 - 440 K/uL Final  . Neutrophils Relative % 03/05/2017 65  % Final  . Neutro Abs 03/05/2017 5.7  1.4 - 6.5 K/uL Final  . Lymphocytes Relative 03/05/2017 23  % Final  . Lymphs Abs 03/05/2017 2.0  1.0 - 3.6 K/uL Final  . Monocytes Relative 03/05/2017 10  % Final  . Monocytes Absolute 03/05/2017 0.8  0.2 - 0.9 K/uL Final  . Eosinophils Relative 03/05/2017 1  % Final  . Eosinophils Absolute 03/05/2017 0.1  0 - 0.7 K/uL Final  . Basophils Relative 03/05/2017 1  % Final  . Basophils Absolute 03/05/2017 0.1  0 - 0.1 K/uL Final  . Sodium 03/06/2017 138  135 - 145 mmol/L Final  . Potassium 03/06/2017 3.9  3.5 - 5.1 mmol/L Final  . Chloride 03/06/2017 107  101 - 111 mmol/L Final  . CO2 03/06/2017 25  22 - 32 mmol/L Final  . Glucose, Bld 03/06/2017 134* 65 - 99 mg/dL Final  . BUN 03/06/2017 23* 6 - 20 mg/dL Final  . Creatinine, Ser  03/06/2017 0.53  0.44 - 1.00 mg/dL Final  . Calcium 03/06/2017 9.0  8.9 - 10.3 mg/dL Final  . GFR calc  non Af Amer 03/06/2017 >60  >60 mL/min Final  . GFR calc Af Amer 03/06/2017 >60  >60 mL/min Final   Comment: (NOTE) The eGFR has been calculated using the CKD EPI equation. This calculation has not been validated in all clinical situations. eGFR's persistently <60 mL/min signify possible Chronic Kidney Disease.   . Anion gap 03/06/2017 6  5 - 15 Final  . WBC 03/06/2017 7.6  3.6 - 11.0 K/uL Final  . RBC 03/06/2017 4.64  3.80 - 5.20 MIL/uL Final  . Hemoglobin 03/06/2017 12.5  12.0 - 16.0 g/dL Final  . HCT 03/06/2017 36.9  35.0 - 47.0 % Final  . MCV 03/06/2017 79.6* 80.0 - 100.0 fL Final  . MCH 03/06/2017 26.9  26.0 - 34.0 pg Final  . MCHC 03/06/2017 33.8  32.0 - 36.0 g/dL Final  . RDW 03/06/2017 13.4  11.5 - 14.5 % Final  . Platelets 03/06/2017 213  150 - 440 K/uL Final  . Glucose-Capillary 03/05/2017 137* 65 - 99 mg/dL Final  . Glucose-Capillary 03/06/2017 131* 65 - 99 mg/dL Final  . Glucose-Capillary 03/06/2017 121* 65 - 99 mg/dL Final  . Glucose-Capillary 03/06/2017 115* 65 - 99 mg/dL Final  . Glucose-Capillary 03/07/2017 119* 65 - 99 mg/dL Final  . Glucose-Capillary 03/07/2017 112* 65 - 99 mg/dL Final  . Glucose-Capillary 03/09/2017 112* 65 - 99 mg/dL Final  . Glucose-Capillary 03/08/2017 183* 65 - 99 mg/dL Final  . Glucose-Capillary 03/09/2017 133* 65 - 99 mg/dL Final  . Creatinine, Ser 03/10/2017 0.63  0.44 - 1.00 mg/dL Final  . GFR calc non Af Amer 03/10/2017 >60  >60 mL/min Final  . GFR calc Af Amer 03/10/2017 >60  >60 mL/min Final   Comment: (NOTE) The eGFR has been calculated using the CKD EPI equation. This calculation has not been validated in all clinical situations. eGFR's persistently <60 mL/min signify possible Chronic Kidney Disease.   . Glucose-Capillary 03/09/2017 99  65 - 99 mg/dL Final  . Creatinine, Ser 03/12/2017 0.67  0.44 - 1.00 mg/dL Final  . GFR calc  non Af Amer 03/12/2017 >60  >60 mL/min Final  . GFR calc Af Amer 03/12/2017 >60  >60 mL/min Final   Comment: (NOTE) The eGFR has been calculated using the CKD EPI equation. This calculation has not been validated in all clinical situations. eGFR's persistently <60 mL/min signify possible Chronic Kidney Disease.     Assessment:  Manmeet Arzola Silveri is a 57 y.o. female with multiple sclerosis diagnosed in 1995.  She was previously treated with Copaxone. . Hepatitis B core antibody total and hepatitis B surface antibody quantitative was negative on 07/14/2016.  Hepatitis testing on 12/16/2015 was negative for the following: Hepatitis B surface antigen, hepatitis Be antigen, hepatitis B core IgM antibody, hepatitis B core total antibody, hepatitis B E antibody, hepatitis B surface antibody, and hepatitis C antibody.  She has received Rituxan (09/06/2016 - 05/09/2017).  She had an MS flare and received Solumedrol 1000 mg daily x 5 (03/05/2017 - 03/09/2017).  She is currently resides at [facility].  Symptomatically, she has lower extremity weakness.  Exam reveals improvement in distal lower extremity strength since last visit.   Plan: 1.  Rituxan today. 2.  RTC in 7 months for MD assessment, labs (CBC with diff, CMP) and 5th Rituxan infusion.    Honor Loh, NP  11/07/2017, 9:38 AM   I saw and evaluated the patient, participating in the key portions of the service and reviewing pertinent diagnostic studies and  records.  I reviewed the nurse practitioner's note and agree with the findings and the plan.  The assessment and plan were discussed with the patient.  A few questions were asked by the patient and answered.   Nolon Stalls, MD 11/07/2017, 9:38 AM

## 2017-11-09 ENCOUNTER — Ambulatory Visit: Payer: PPO

## 2017-11-09 DIAGNOSIS — R2689 Other abnormalities of gait and mobility: Secondary | ICD-10-CM

## 2017-11-09 DIAGNOSIS — M6281 Muscle weakness (generalized): Secondary | ICD-10-CM

## 2017-11-09 DIAGNOSIS — R2681 Unsteadiness on feet: Secondary | ICD-10-CM

## 2017-11-09 NOTE — Therapy (Signed)
[place] MAIN [facility] SERVICES [place], [place], [address] Phone: [phone]   Fax:  [phone]  Physical Therapy Treatment  Patient Details  Name: Lisa Williams MRN: [id number] Date of Birth: 16-Jan-1961 Referring Provider: Gurney Maxin, MD   Encounter Date: 11/09/2017  PT End of Session - 11/09/17 1137    Visit Number  22    Number of Visits  26    Date for PT Re-Evaluation  11/14/17    PT Start Time  1000    PT Stop Time  1045    PT Time Calculation (min)  45 min    Activity Tolerance  Patient tolerated treatment well;Patient limited by fatigue;Other (comment)    Behavior During Therapy  WFL for tasks assessed/performed       Past Medical History:  Diagnosis Date  . Abdominal pain, right upper quadrant   . Back pain   . Calculus of kidney 12/09/2013  . Chronic back pain    unspecified  . Chronic left shoulder pain 07/19/2015  . Functional disorder of bladder    other  . Galactorrhea 11/26/2014   Chronic   . Hereditary and idiopathic neuropathy 08/19/2013  . HPV test positive   . Hypercholesteremia 08/19/2013  . Incomplete bladder emptying   . Microscopic hematuria   . MS (multiple sclerosis) ([place])   . Muscle spasticity 05/21/2014  . Nonspecific findings on examination of urine    other  . Osteopenia   . Status post laparoscopic supracervical hysterectomy 11/26/2014  . Tobacco user 11/26/2014  . Wrist fracture     Past Surgical History:  Procedure Laterality Date  . bilateral tubal ligation  1996  . BREAST CYST EXCISION Left 2002  . KNEE SURGERY     right  . LAPAROSCOPIC SUPRACERVICAL HYSTERECTOMY  08/05/2013  . ORIF WRIST FRACTURE Left 01/17/2017   Procedure: OPEN REDUCTION INTERNAL FIXATION (ORIF) WRIST FRACTURE;  Surgeon: Lovell Sheehan, MD;  Location: [facility] ORS;  Service: Orthopedics;  Laterality: Left;  . TUBAL LIGATION Bilateral   . VAGINAL HYSTERECTOMY  03/2006    There were no vitals filed  for this visit.  Subjective Assessment - 11/09/17 1003    Subjective  Patient came to session with sister. Had her infusion yesterday for MS. Reports she has surprised her doctor since last time he saw her she wasn't walking.     Patient is accompained by:  Family member    Pertinent History  Pt is a 57 y/o F who presents with BLE weakness and difficulty ambulating due to h/o MS that was diagnosed in 1995. Pt denies any pain associated. Pt was admitted to the hospital in November 2018 due to MS exacerbation. Nov-Dec to HHPT-->[facility] until end of Jan. After rehab the pt was able to ambulate up to 100 ft with the RW. Pt has Bil AFO, her therapist at [facility] thinks she needs a new AFO as her feet still drag when she ambulates, pt reports HHPT mentioned a spring AFO. Pt has custom AFOs that were originally made 4 years ago. Goals: improve balance, to be able to get into the shower with her tub bench without assist, to be able to get up 2 steps at sister's home with railings on both sides but too far apart to reach both sides, to be able to drive adapted car. Pt is staying at other sister's house with one step to get inside and currently picking up L leg with UEs and then  stepping in. Pt steps down leading with LLE. Pt wants to work on walking side to side as this is challenging and she had not yet progressed to this at rehab. Pt able to get in/out the car with assist only to bring BLEs into the car. Hoping to drive by June or July with adapted car (already has one). Pt needs assist bringing LEs into and out of hospital bed. Pt able to perform sit>stand without assist from [facility] with a pillow on the seat for a boost. Pt denies any falls in the past 6 months. Pt has been increasing her time alone at home up to a few hours at a time to become more independent. Pt has both manual and power WC. Uses power WC at home, uses manual WC out in community. Pt able to self propel manual WC. Pt needs assist putting pants  around ankles but she can pull them up. Pt can put on her shoes but needs help getting her leg up to do so. Pt able to tie her shoelaces on her own. Pt has a tub/shower unit at home with a tub bench. No grab bars in the shower. Has hand held shower head. Pt currently taking a sponge bath. Pt has a regular height toilet with a BSC over top. Pt with h/o L wrist fx with no precautions, pt wore her soft brace to evaluation for comfort but reports her wrist has healed and is doing well following OT.     Limitations  Lifting;Standing;Walking;House hold activities    How long can you stand comfortably?  3 minutes without UE assist, limited by fatigue    How long can you walk comfortably?  100 ft, limited by fatigue    Patient Stated Goals  see above    Currently in Pain?  No/denies      Airex pad: toe taps BUE support 10x each leg, cues for bringing knee to ceiling (hip flexion) prior to taps.     Ambulation in // bars 10' x4 lengths,  Cues to avoid trunk flexion and to initiate step by moving pelvis forward. verbal cues for hip flexion and knee extension    March 10x each leg BUE support;  Improved clearance of R foot with cues for knee to sky     Stepping forward over tape on floor with emphasis on floor clearance 1x10 each LE with BUE support; able to support clear with RLE without foot drag      Sit to stand from elevated seat with UE assist on // bars x5    Seated:  Balloon pass with PT to challenge patient reach within and outside of BOS for core activation. LE supported 3 minutes   Seated knee extensions, require Min A to provide superior lift of leg for patient to return to starting position, hamstring activation   Sit to stand: focus on primarily utilizing LE's with tactile cueing to RLE for support. Decreased need for assistance demonstrating carryover from previous session.     Pt. response to medical necessity: Patient will continue to benefit from skilled PT to improve mobility and  independence                           PT Education - 11/09/17 1005    Education provided  Yes    Education Details  exercise technique     Person(s) Educated  Patient    Methods  Explanation;Demonstration;Verbal cues    Comprehension  Verbalized understanding;Returned demonstration       PT Short Term Goals - 09/25/17 1714      PT SHORT TERM GOAL #1   Title  Pt will be completed HEP with min assist from caregiver at least 4 days/wk for improved carryover between sessions    Time  2    Period  Weeks    Status  New      PT SHORT TERM GOAL #2   Title  Patient will report a worst pain of 6/10 on VAS in cervical spine to improve tolerance with ADLs and reduced symptoms with activities.     Baseline  4/30: 9/10 5/21: 0/10 pain    Time  2    Period  Weeks    Status  Achieved      PT SHORT TERM GOAL #3   Title  Patient will perform cervical rotation at least 50 degrees, flexion and extension to 70 degrees, and sidebending to 40 degrees without pain    Baseline  4/30: R 50 L 34 Rotation, R 26 L 35 sb, Extension 35 flex 35 ; 5/21: extension 46, SB R 41 L 40, rotation R 62 L 61,     Time  2    Period  Weeks    Status  Achieved        PT Long Term Goals - 09/25/17 1716      PT LONG TERM GOAL #1   Title  Pt's 5xSTS will impove to equal to or below 2 minutes to demonstrate improved balance and BLE strength    Baseline  3.02 minutes; 5/15: 1 min 30 seconds    Time  6    Period  Weeks    Status  Achieved      PT LONG TERM GOAL #2   Title  Pt's 73mT will improve to at least 0.5 m/s for improved safety ambulating in home    Baseline  0.14 m/s; 5/15: .232m     Time  8    Period  Weeks    Status  Partially Met      PT LONG TERM GOAL #3   Title  Pt's Bil hip F strength will improve to at least 2+/5 BLEs for improved gait mechanics and safety    Baseline  L: 2/5, R: 1/5    Time  8    Period  Weeks    Status  New      PT LONG TERM GOAL #4   Title  Pt's  ABC Scale will improve to at least 40% to demonstrate improved balance    Baseline  28.13% 5/15; 24.4%    Time  6    Period  Weeks    Status  New      PT LONG TERM GOAL #5   Title  Pt will be able to perform car transfer without assist to work towards pt's goal of returning to driving    Baseline  only needs help with one leg to put onto running board to get into sisters car    Time  6    Period  Weeks    Status  Partially Met      PT LONG TERM GOAL #6   Title  Pt will be able to perform tub bench into/out of shower transfer without assist for improved independence with this task    Baseline  challenged with steps at this time, getting better, stands and gives sponge bath independently now  Time  6    Period  Weeks    Status  Partially Met      PT LONG TERM GOAL #7   Title  Patient will perform cervical rotation at least 60 degrees, flexion and extension to 70 degrees, and sidebending to 40 degrees without pain    Baseline  4/30: R 50 L 34 Rotation, R 26 L 35 sb, Extension 35 flex 35; 5/21: extension 46, SB R 41 L 40, rotation R 62 L 61,     Time  4    Period  Weeks    Status  Achieved      PT LONG TERM GOAL #8   Title  Patient will improve NDI to <28% to reduce disability to mild  percieved disability for improved quality of life.     Baseline  4/30: 56% severe disability 5/21: 10%    Time  4    Period  Weeks    Status  Achieved      PT LONG TERM GOAL  #9   TITLE  Patient will report a worst pain of 3/10 on VAS in  cervical spine  to improve tolerance with ADLs and reduced symptoms with activities.     Baseline  4/30: 9/10 5/21: 0/10 in neck     Time  4    Period  Weeks    Status  Achieved      PT LONG TERM GOAL  #10   TITLE  Pt's 5xSTS will impove to equal to or below 50 seconds to demonstrate improved balance and BLE strength    Baseline  5/15: 1 min 30.9 seconds    Time  6    Period  Weeks    Status  New            Plan - 11/09/17 1140    Clinical  Impression Statement  Patient demonstrated improved ambulatory mechanics with increased height of hip flexion and angle of knee extension allowing for greater step length bilaterally. Patient's ability to recruit muscles is improving with increased strength and specificity of recruitment. Patient will continue to benefit from skilled PT to improve mobility and independence     Rehab Potential  Good    PT Frequency  2x / week    PT Duration  8 weeks    PT Treatment/Interventions  ADLs/Self Care Home Management;Aquatic Therapy;Biofeedback;Cryotherapy;Electrical Stimulation;Iontophoresis 12m/ml Dexamethasone;Traction;Moist Heat;Ultrasound;DME Instruction;Gait training;Stair training;Functional mobility training;Therapeutic activities;Therapeutic exercise;Balance training;Neuromuscular re-education;Patient/family education;Orthotic Fit/Training;Wheelchair mobility training;Manual techniques;Compression bandaging;Passive range of motion;Dry needling;Energy conservation;Taping;Splinting    PT Next Visit Plan  assess need for new AFOs or other orthotic to assist with ambulation, initiate strengthening program, practice and improve technique for bed mobility and sit<>stand    PT Home Exercise Plan  to initiate next session (please incorporate hip flexor and adductor stretches in HEP if possible -KG)    Consulted and Agree with Plan of Care  Patient       Patient will benefit from skilled therapeutic intervention in order to improve the following deficits and impairments:  Abnormal gait, Decreased activity tolerance, Decreased balance, Decreased coordination, Decreased endurance, Decreased knowledge of use of DME, Decreased mobility, Decreased range of motion, Decreased safety awareness, Decreased strength, Difficulty walking, Impaired perceived functional ability, Impaired sensation, Impaired tone, Impaired UE functional use, Improper body mechanics, Postural dysfunction  Visit Diagnosis: Muscle weakness  (generalized)  Other abnormalities of gait and mobility  Unsteadiness on feet     Problem List Patient Active Problem List   Diagnosis  Date Noted  . Wrist fracture 01/16/2017  . Health care maintenance 01/24/2016  . Blood pressure elevated without history of HTN 10/25/2015  . Multiple sclerosis ([place]) 10/02/2015  . Chronic left shoulder pain 07/19/2015  . Multiple sclerosis exacerbation ([place]) 07/14/2015  . MS (multiple sclerosis) ([place]) 11/26/2014  . Increased body mass index 11/26/2014  . HPV test positive 11/26/2014  . Status post laparoscopic supracervical hysterectomy 11/26/2014  . Galactorrhea 11/26/2014  . Tobacco user 11/26/2014  . Back ache 05/21/2014  . Adiposity 05/21/2014  . Disordered sleep 05/21/2014  . Muscle spasticity 05/21/2014  . Calculus of kidney 12/09/2013  . Renal colic 30/85/6943  . Hypercholesteremia 08/19/2013  . Hereditary and idiopathic neuropathy 08/19/2013  . Hypercholesterolemia without hypertriglyceridemia 08/19/2013  . Bladder infection, chronic 07/25/2012  . Disorder of bladder function 07/25/2012  . Incomplete bladder emptying 07/25/2012  . Microscopic hematuria 07/25/2012   Janna Arch, PT, DPT   11/09/2017, 11:41 AM  [place] MAIN [facility] SERVICES [address] [place], [place], [address] Phone: [phone]   Fax:  [phone]  Name: Lisa Williams MRN: [id number] Date of Birth: [birth year]

## 2017-11-11 DIAGNOSIS — G35 Multiple sclerosis: Secondary | ICD-10-CM | POA: Diagnosis not present

## 2017-11-12 ENCOUNTER — Ambulatory Visit: Payer: PPO

## 2017-11-12 DIAGNOSIS — M8080XD Other osteoporosis with current pathological fracture, unspecified site, subsequent encounter for fracture with routine healing: Secondary | ICD-10-CM | POA: Diagnosis not present

## 2017-11-12 DIAGNOSIS — G35 Multiple sclerosis: Secondary | ICD-10-CM | POA: Diagnosis not present

## 2017-11-12 DIAGNOSIS — I10 Essential (primary) hypertension: Secondary | ICD-10-CM | POA: Diagnosis not present

## 2017-11-12 DIAGNOSIS — Z0000 Encounter for general adult medical examination without abnormal findings: Secondary | ICD-10-CM | POA: Diagnosis not present

## 2017-11-12 DIAGNOSIS — E669 Obesity, unspecified: Secondary | ICD-10-CM | POA: Diagnosis not present

## 2017-11-13 ENCOUNTER — Ambulatory Visit: Payer: PPO

## 2017-11-13 DIAGNOSIS — M5412 Radiculopathy, cervical region: Secondary | ICD-10-CM

## 2017-11-13 DIAGNOSIS — M6281 Muscle weakness (generalized): Secondary | ICD-10-CM | POA: Diagnosis not present

## 2017-11-13 DIAGNOSIS — R2689 Other abnormalities of gait and mobility: Secondary | ICD-10-CM

## 2017-11-13 DIAGNOSIS — R2681 Unsteadiness on feet: Secondary | ICD-10-CM

## 2017-11-13 NOTE — Therapy (Signed)
[place] MAIN [facility] SERVICES [place], [place], [address] Phone: [phone]   Fax:  [phone]  Physical Therapy Treatment  Patient Details  Name: Lisa Williams MRN: [id number] Date of Birth: [birth year] Referring Provider: Gurney Maxin, MD   Encounter Date: 11/13/2017  PT End of Session - 11/14/17 0936    Visit Number  23    Number of Visits  26    Date for PT Re-Evaluation  11/14/17    PT Start Time  1330    PT Stop Time  1430    PT Time Calculation (min)  60 min    Activity Tolerance  Patient tolerated treatment well;Patient limited by fatigue;Other (comment)    Behavior During Therapy  WFL for tasks assessed/performed       Past Medical History:  Diagnosis Date  . Abdominal pain, right upper quadrant   . Back pain   . Calculus of kidney 12/09/2013  . Chronic back pain    unspecified  . Chronic left shoulder pain 07/19/2015  . Functional disorder of bladder    other  . Galactorrhea 11/26/2014   Chronic   . Hereditary and idiopathic neuropathy 08/19/2013  . HPV test positive   . Hypercholesteremia 08/19/2013  . Incomplete bladder emptying   . Microscopic hematuria   . MS (multiple sclerosis) ([place])   . Muscle spasticity 05/21/2014  . Nonspecific findings on examination of urine    other  . Osteopenia   . Status post laparoscopic supracervical hysterectomy 11/26/2014  . Tobacco user 11/26/2014  . Wrist fracture     Past Surgical History:  Procedure Laterality Date  . bilateral tubal ligation  1996  . BREAST CYST EXCISION Left 2002  . KNEE SURGERY     right  . LAPAROSCOPIC SUPRACERVICAL HYSTERECTOMY  08/05/2013  . ORIF WRIST FRACTURE Left 01/17/2017   Procedure: OPEN REDUCTION INTERNAL FIXATION (ORIF) WRIST FRACTURE;  Surgeon: Lovell Sheehan, MD;  Location: [facility] ORS;  Service: Orthopedics;  Laterality: Left;  . TUBAL LIGATION Bilateral   . VAGINAL HYSTERECTOMY  03/2006    There were no vitals filed  for this visit.    Pelvic Floor Physical Therapy Treatment Note  SCREENING  Changes in medications, allergies, or medical history?: no    SUBJECTIVE  Patient reports: Had a BM 3 times the week after our last visit and two times so far this week and they are not as large as they were. Stool is now more like bristol stool scale 3-4 it was 1-2 prior. Has not had any accidents or leaks using urge suppression technique.    Precautions:  MS  Pain update: No pain today  Patient Goals: Decrease urinary hesitancy, functional incontinence at the toilet and decrease constipation to decrease risk of fall and re-injury as well as improve quality of life.   OBJECTIVE  Changes in:  Range of Motion/Flexibilty:  Pt. Does not have sufficient hip ABD to allow her to bring legs wide enough to use squatty-potty. She demonstrated increased ROM following TP release today, still not a functional amount, however.  Palpation: TTP to B Adductors and Psoas.  Gait Analysis: Uses Walker with forward lean, using momentum to advance legs rather than hip or knee flexion.  INTERVENTIONS THIS SESSION: Self-care: Educated on how TP release and DN work to increase recruitment and blood flow as well as reduce spasms for improved function and how to position in her chair to minimize the muscle's tendency to adduct. Educated  on taking standing stretch breaks to maintain hip flexor length and the effect that sleeping in a flexed-hip position is having on her walking. Manual: performed TP release to B Adductor magnus and Psoas to decrease pressure on nerves that innervate the pelvis for further decrease in constipation and to improve hip ROM to allow for Squatty-potty use.  Total time: 60 min.                          PT Education - 11/14/17 0935    Education provided  Yes    Education Details  Positioning and standing to decrease hip flexor tightness    Person(s) Educated  Patient     Methods  Explanation;Demonstration;Verbal cues    Comprehension  Verbalized understanding;Returned demonstration       PT Short Term Goals - 09/25/17 1714      PT SHORT TERM GOAL #1   Title  Pt will be completed HEP with min assist from caregiver at least 4 days/wk for improved carryover between sessions    Time  2    Period  Weeks    Status  New      PT SHORT TERM GOAL #2   Title  Patient will report a worst pain of 6/10 on VAS in cervical spine to improve tolerance with ADLs and reduced symptoms with activities.     Baseline  4/30: 9/10 5/21: 0/10 pain    Time  2    Period  Weeks    Status  Achieved      PT SHORT TERM GOAL #3   Title  Patient will perform cervical rotation at least 50 degrees, flexion and extension to 70 degrees, and sidebending to 40 degrees without pain    Baseline  4/30: R 50 L 34 Rotation, R 26 L 35 sb, Extension 35 flex 35 ; 5/21: extension 46, SB R 41 L 40, rotation R 62 L 61,     Time  2    Period  Weeks    Status  Achieved        PT Long Term Goals - 09/25/17 1716      PT LONG TERM GOAL #1   Title  Pt's 5xSTS will impove to equal to or below 2 minutes to demonstrate improved balance and BLE strength    Baseline  3.02 minutes; 5/15: 1 min 30 seconds    Time  6    Period  Weeks    Status  Achieved      PT LONG TERM GOAL #2   Title  Pt's 33mT will improve to at least 0.5 m/s for improved safety ambulating in home    Baseline  0.14 m/s; 5/15: .282m     Time  8    Period  Weeks    Status  Partially Met      PT LONG TERM GOAL #3   Title  Pt's Bil hip F strength will improve to at least 2+/5 BLEs for improved gait mechanics and safety    Baseline  L: 2/5, R: 1/5    Time  8    Period  Weeks    Status  New      PT LONG TERM GOAL #4   Title  Pt's ABC Scale will improve to at least 40% to demonstrate improved balance    Baseline  28.13% 5/15; 24.4%    Time  6    Period  Weeks    Status  New      PT LONG TERM GOAL #5   Title  Pt will be  able to perform car transfer without assist to work towards pt's goal of returning to driving    Baseline  only needs help with one leg to put onto running board to get into sisters car    Time  6    Period  Weeks    Status  Partially Met      PT LONG TERM GOAL #6   Title  Pt will be able to perform tub bench into/out of shower transfer without assist for improved independence with this task    Baseline  challenged with steps at this time, getting better, stands and gives sponge bath independently now     Time  6    Period  Weeks    Status  Partially Met      PT LONG TERM GOAL #7   Title  Patient will perform cervical rotation at least 60 degrees, flexion and extension to 70 degrees, and sidebending to 40 degrees without pain    Baseline  4/30: R 50 L 34 Rotation, R 26 L 35 sb, Extension 35 flex 35; 5/21: extension 46, SB R 41 L 40, rotation R 62 L 61,     Time  4    Period  Weeks    Status  Achieved      PT LONG TERM GOAL #8   Title  Patient will improve NDI to <28% to reduce disability to mild  percieved disability for improved quality of life.     Baseline  4/30: 56% severe disability 5/21: 10%    Time  4    Period  Weeks    Status  Achieved      PT LONG TERM GOAL  #9   TITLE  Patient will report a worst pain of 3/10 on VAS in  cervical spine  to improve tolerance with ADLs and reduced symptoms with activities.     Baseline  4/30: 9/10 5/21: 0/10 in neck     Time  4    Period  Weeks    Status  Achieved      PT LONG TERM GOAL  #10   TITLE  Pt's 5xSTS will impove to equal to or below 50 seconds to demonstrate improved balance and BLE strength    Baseline  5/15: 1 min 30.9 seconds    Time  6    Period  Weeks    Status  New            Plan - 11/14/17 0936    Clinical Impression Statement  Patient has demonstrated significant improvement, going from 1 BM every ~ 2 weeks to 3 BM's in one week with decreased size and softer consistency. She has also not had any urinary  leakage over the past two weeks. She responded well to today's interventions, demonstrated improved sensation and recruitment as well as decreased spasm in the Adductors and Psoas B. Continue per  POC to decrease adductor spasms further via DN and to review sit-to-stand with exhale for ease of transitions.     Clinical Presentation  Evolving    Clinical Decision Making  Moderate    Rehab Potential  Good    PT Frequency  2x / week    PT Duration  8 weeks    PT Treatment/Interventions  ADLs/Self Care Home Management;Aquatic Therapy;Biofeedback;Cryotherapy;Electrical Stimulation;Iontophoresis 55m/ml Dexamethasone;Traction;Moist Heat;Ultrasound;DME Instruction;Gait training;Stair training;Functional mobility training;Therapeutic activities;Therapeutic exercise;Balance training;Neuromuscular re-education;Patient/family  education;Orthotic Fit/Training;Wheelchair mobility training;Manual techniques;Compression bandaging;Passive range of motion;Dry needling;Energy conservation;Taping;Splinting    PT Next Visit Plan  assess need for new AFOs or other orthotic to assist with ambulation, initiate strengthening program, practice and improve technique for bed mobility and sit<>stand    PT Home Exercise Plan  to initiate next session (please incorporate hip flexor and adductor stretches in HEP if possible -KG)    Consulted and Agree with Plan of Care  Patient       Patient will benefit from skilled therapeutic intervention in order to improve the following deficits and impairments:  Abnormal gait, Decreased activity tolerance, Decreased balance, Decreased coordination, Decreased endurance, Decreased knowledge of use of DME, Decreased mobility, Decreased range of motion, Decreased safety awareness, Decreased strength, Difficulty walking, Impaired perceived functional ability, Impaired sensation, Impaired tone, Impaired UE functional use, Improper body mechanics, Postural dysfunction  Visit Diagnosis: Muscle  weakness (generalized)  Other abnormalities of gait and mobility  Unsteadiness on feet  Radiculopathy, cervical region     Problem List Patient Active Problem List   Diagnosis Date Noted  . Wrist fracture 01/16/2017  . Health care maintenance 01/24/2016  . Blood pressure elevated without history of HTN 10/25/2015  . Multiple sclerosis ([place]) 10/02/2015  . Chronic left shoulder pain 07/19/2015  . Multiple sclerosis exacerbation ([place]) 07/14/2015  . MS (multiple sclerosis) ([place]) 11/26/2014  . Increased body mass index 11/26/2014  . HPV test positive 11/26/2014  . Status post laparoscopic supracervical hysterectomy 11/26/2014  . Galactorrhea 11/26/2014  . Tobacco user 11/26/2014  . Back ache 05/21/2014  . Adiposity 05/21/2014  . Disordered sleep 05/21/2014  . Muscle spasticity 05/21/2014  . Calculus of kidney 12/09/2013  . Renal colic 67/61/9509  . Hypercholesteremia 08/19/2013  . Hereditary and idiopathic neuropathy 08/19/2013  . Hypercholesterolemia without hypertriglyceridemia 08/19/2013  . Bladder infection, chronic 07/25/2012  . Disorder of bladder function 07/25/2012  . Incomplete bladder emptying 07/25/2012  . Microscopic hematuria 07/25/2012   Willa Rough DPT, ATC Willa Rough 11/14/2017, 9:46 AM  [place] MAIN [facility] SERVICES [address] [place], [place], [address] Phone: [phone]   Fax:  [phone]  Name: Lisa Williams MRN: [id number] Date of Birth: 09/03/1960

## 2017-11-14 ENCOUNTER — Ambulatory Visit: Payer: PPO

## 2017-11-14 DIAGNOSIS — M6281 Muscle weakness (generalized): Secondary | ICD-10-CM | POA: Diagnosis not present

## 2017-11-14 DIAGNOSIS — R2689 Other abnormalities of gait and mobility: Secondary | ICD-10-CM

## 2017-11-14 DIAGNOSIS — R2681 Unsteadiness on feet: Secondary | ICD-10-CM

## 2017-11-14 NOTE — Therapy (Signed)
[place] MAIN [facility] SERVICES [address] [place], [place], [address] Phone: [phone]   Fax:  [phone]  Physical Therapy Treatment Physical Therapy Progress Note   Dates of reporting period 09/25/17  to   11/14/17  Patient Details  Name: Lisa Williams MRN: [id number] Date of Birth: August 21, 1960 Referring Provider: Gurney Maxin, MD   Encounter Date: 11/14/2017  PT End of Session - 11/14/17 1242    Visit Number  24    Number of Visits  36    Date for PT Re-Evaluation  12/26/17    Authorization Type  1/10 ( PN start 7/10)    PT Start Time  1115    PT Stop Time  1201    PT Time Calculation (min)  46 min    Activity Tolerance  Patient tolerated treatment well;Patient limited by fatigue;Other (comment)    Behavior During Therapy  WFL for tasks assessed/performed       Past Medical History:  Diagnosis Date  . Abdominal pain, right upper quadrant   . Back pain   . Calculus of kidney 12/09/2013  . Chronic back pain    unspecified  . Chronic left shoulder pain 07/19/2015  . Functional disorder of bladder    other  . Galactorrhea 11/26/2014   Chronic   . Hereditary and idiopathic neuropathy 08/19/2013  . HPV test positive   . Hypercholesteremia 08/19/2013  . Incomplete bladder emptying   . Microscopic hematuria   . MS (multiple sclerosis) ([place])   . Muscle spasticity 05/21/2014  . Nonspecific findings on examination of urine    other  . Osteopenia   . Status post laparoscopic supracervical hysterectomy 11/26/2014  . Tobacco user 11/26/2014  . Wrist fracture     Past Surgical History:  Procedure Laterality Date  . bilateral tubal ligation  1996  . BREAST CYST EXCISION Left 2002  . KNEE SURGERY     right  . LAPAROSCOPIC SUPRACERVICAL HYSTERECTOMY  08/05/2013  . ORIF WRIST FRACTURE Left 01/17/2017   Procedure: OPEN REDUCTION INTERNAL FIXATION (ORIF) WRIST FRACTURE;  Surgeon: Lovell Sheehan, MD;  Location: [facility] ORS;   Service: Orthopedics;  Laterality: Left;  . TUBAL LIGATION Bilateral   . VAGINAL HYSTERECTOMY  03/2006    There were no vitals filed for this visit.  Subjective Assessment - 11/14/17 1151    Subjective  Patient reports having a good weekend. Has been walking since last session and doing well with her HEP compliance. Will be going to [place] next week with her sister and mom.     Patient is accompained by:  Family member    Pertinent History  Pt is a 57 y/o F who presents with BLE weakness and difficulty ambulating due to h/o MS that was diagnosed in 1995. Pt denies any pain associated. Pt was admitted to the hospital in November 2018 due to MS exacerbation. Nov-Dec to HHPT-->[facility] until end of Jan. After rehab the pt was able to ambulate up to 100 ft with the RW. Pt has Bil AFO, her therapist at [facility] thinks she needs a new AFO as her feet still drag when she ambulates, pt reports HHPT mentioned a spring AFO. Pt has custom AFOs that were originally made 4 years ago. Goals: improve balance, to be able to get into the shower with her tub bench without assist, to be able to get up 2 steps at sister's home with railings on both sides but too far apart to reach both  sides, to be able to drive adapted car. Pt is staying at other sister's house with one step to get inside and currently picking up L leg with UEs and then stepping in. Pt steps down leading with LLE. Pt wants to work on walking side to side as this is challenging and she had not yet progressed to this at rehab. Pt able to get in/out the car with assist only to bring BLEs into the car. Hoping to drive by June or July with adapted car (already has one). Pt needs assist bringing LEs into and out of hospital bed. Pt able to perform sit>stand without assist from [facility] with a pillow on the seat for a boost. Pt denies any falls in the past 6 months. Pt has been increasing her time alone at home up to a few hours at a time to become more  independent. Pt has both manual and power WC. Uses power WC at home, uses manual WC out in community. Pt able to self propel manual WC. Pt needs assist putting pants around ankles but she can pull them up. Pt can put on her shoes but needs help getting her leg up to do so. Pt able to tie her shoelaces on her own. Pt has a tub/shower unit at home with a tub bench. No grab bars in the shower. Has hand held shower head. Pt currently taking a sponge bath. Pt has a regular height toilet with a BSC over top. Pt with h/o L wrist fx with no precautions, pt wore her soft brace to evaluation for comfort but reports her wrist has healed and is doing well following OT.     Limitations  Lifting;Standing;Walking;House hold activities    How long can you stand comfortably?  3 minutes without UE assist, limited by fatigue    How long can you walk comfortably?  100 ft, limited by fatigue    Patient Stated Goals  see above    Currently in Pain?  No/denies      10 MWT= .36 m/s 5x STS=64 seconds  ABC=23.4   Attempted walkAide: however was unable to reproduce a strong twitch response. Education on nerve twitch, how WalkAide functions; including pro's and cone.   Seated knee extensions 12x each leg   Ambulate x 5 ft with CGA with RW and turning to the L x1 trial, to the R x1 trial.   Education on mobility at home; challenges for patient include 1. Taking a shower, 2. Walking down a ramp in front of the house 3. Cross left leg over right to put on clothes; 4. Get into sister's car without assistance; sometimes can do with R not L, 5. Get in the bed without assistance ; challenged with LLE.   Patient's condition has the potential to improve in response to therapy. Maximum improvement is yet to be obtained. The anticipated improvement is attainable and reasonable in a generally predictable time. Start date of reporting period 09/25/17 end date of reporting period 11/14/17. Patient reports she is feeling more independent  with her mobility at home and with ADLs.                     PT Education - 11/14/17 1242    Education provided  Yes    Education Details  goal progression, mobility, POC     Person(s) Educated  Patient    Methods  Explanation;Demonstration;Verbal cues    Comprehension  Verbalized understanding;Returned demonstration  PT Short Term Goals - 11/14/17 1339      PT SHORT TERM GOAL #1   Title  Pt will be completed HEP with min assist from caregiver at least 4 days/wk for improved carryover between sessions    Baseline  HEP compliant     Time  2    Period  Weeks    Status  Partially Met      PT SHORT TERM GOAL #2   Title  Patient will report a worst pain of 6/10 on VAS in cervical spine to improve tolerance with ADLs and reduced symptoms with activities.     Baseline  4/30: 9/10 5/21: 0/10 pain    Time  2    Period  Weeks    Status  Achieved      PT SHORT TERM GOAL #3   Title  Patient will perform cervical rotation at least 50 degrees, flexion and extension to 70 degrees, and sidebending to 40 degrees without pain    Baseline  4/30: R 50 L 34 Rotation, R 26 L 35 sb, Extension 35 flex 35 ; 5/21: extension 46, SB R 41 L 40, rotation R 62 L 61,     Time  2    Period  Weeks    Status  Achieved        PT Long Term Goals - 11/14/17 1147      PT LONG TERM GOAL #1   Title  Pt's 5xSTS will impove to equal to or below 2 minutes to demonstrate improved balance and BLE strength    Baseline  3.02 minutes; 5/15: 1 min 30 seconds    Time  6    Period  Weeks    Status  Achieved      PT LONG TERM GOAL #2   Title  Pt's 77mT will improve to at least 0.5 m/s for improved safety ambulating in home    Baseline  0.14 m/s; 5/15: .269m  7/10: .36 m/s     Time  8    Period  Weeks    Status  Partially Met    Target Date  12/26/17      PT LONG TERM GOAL #3   Title  Pt's Bil hip F strength will improve to at least 2+/5 BLEs for improved gait mechanics and safety     Baseline  L: 2/5, R: 1/5 7/10: 2/5 bilaterally     Time  8    Period  Weeks    Status  Partially Met    Target Date  12/26/17      PT LONG TERM GOAL #4   Title  Pt's ABC Scale will improve to at least 40% to demonstrate improved balance    Baseline  28.13% 5/15; 24.4% 7/10: 23.4%     Time  6    Period  Weeks    Status  Partially Met    Target Date  12/26/17      PT LONG TERM GOAL #5   Title  Pt will be able to perform car transfer without assist to work towards pt's goal of returning to driving    Baseline  only needs help with one leg to put onto running board to get into sisters car    Time  6    Period  Weeks    Status  Partially Met      Additional Long Term Goals   Additional Long Term Goals  Yes      PT LONG  TERM GOAL #6   Title  Pt will be able to perform tub bench into/out of shower transfer without assist for improved independence with this task    Baseline  challenged with steps at this time, getting better, stands and gives sponge bath independently now     Time  6    Period  Weeks    Status  Partially Met      PT LONG TERM GOAL #7   Title  Patient will perform cervical rotation at least 60 degrees, flexion and extension to 70 degrees, and sidebending to 40 degrees without pain    Baseline  4/30: R 50 L 34 Rotation, R 26 L 35 sb, Extension 35 flex 35; 5/21: extension 46, SB R 41 L 40, rotation R 62 L 61,     Time  4    Period  Weeks    Status  Achieved      PT LONG TERM GOAL #8   Title  Patient will improve NDI to <28% to reduce disability to mild  percieved disability for improved quality of life.     Baseline  4/30: 56% severe disability 5/21: 10%    Time  4    Period  Weeks    Status  Achieved      PT LONG TERM GOAL  #9   TITLE  Patient will report a worst pain of 3/10 on VAS in  cervical spine  to improve tolerance with ADLs and reduced symptoms with activities.     Baseline  4/30: 9/10 5/21: 0/10 in neck     Time  4    Period  Weeks    Status   Achieved      PT LONG TERM GOAL  #10   TITLE  Pt's 5xSTS will impove to equal to or below 50 seconds to demonstrate improved balance and BLE strength    Baseline  5/15: 1 min 30.9 seconds 7/10: 64 seconds     Time  6    Period  Weeks    Status  Partially Met    Target Date  12/26/17      PT LONG TERM GOAL  #11   TITLE  Patient will descend/ascend down a handicap ramp  with RW and with supervision to increase independence in home environment.     Baseline  7/10 can ascend, unable to descend     Time  6    Period  Weeks    Status  New    Target Date  12/26/17            Plan - 11/14/17 1339    Clinical Impression Statement  Patient demonstrating improvement with goals with improved mobility and transfers. 10 MWT improved to .36 m/s and 5x STS =64 seconds demonstrating large improvement. ABC 23.4%. Improved ability to perform transfers with decreased reliance upon UE's demonstrated. Patient's ability to recruit muscles is improving with increased strength and specificity of recruitment. Patient's condition has the potential to improve in response to therapy. Maximum improvement is yet to be obtained. The anticipated improvement is attainable and reasonable in a generally predictable time. Patient will continue to benefit from skilled PT to improve mobility and independence     Rehab Potential  Good    PT Frequency  2x / week    PT Duration  8 weeks    PT Treatment/Interventions  ADLs/Self Care Home Management;Aquatic Therapy;Biofeedback;Cryotherapy;Electrical Stimulation;Iontophoresis 1m/ml Dexamethasone;Traction;Moist Heat;Ultrasound;DME Instruction;Gait training;Stair training;Functional mobility training;Therapeutic activities;Therapeutic exercise;Balance training;Neuromuscular re-education;Patient/family  education;Orthotic Fit/Training;Wheelchair mobility training;Manual techniques;Compression bandaging;Passive range of motion;Dry needling;Energy conservation;Taping;Splinting    PT  Next Visit Plan  assess need for new AFOs or other orthotic to assist with ambulation, initiate strengthening program, practice and improve technique for bed mobility and sit<>stand    PT Home Exercise Plan  to initiate next session (please incorporate hip flexor and adductor stretches in HEP if possible -KG)    Consulted and Agree with Plan of Care  Patient       Patient will benefit from skilled therapeutic intervention in order to improve the following deficits and impairments:  Abnormal gait, Decreased activity tolerance, Decreased balance, Decreased coordination, Decreased endurance, Decreased knowledge of use of DME, Decreased mobility, Decreased range of motion, Decreased safety awareness, Decreased strength, Difficulty walking, Impaired perceived functional ability, Impaired sensation, Impaired tone, Impaired UE functional use, Improper body mechanics, Postural dysfunction  Visit Diagnosis: Muscle weakness (generalized)  Other abnormalities of gait and mobility  Unsteadiness on feet     Problem List Patient Active Problem List   Diagnosis Date Noted  . Wrist fracture 01/16/2017  . Health care maintenance 01/24/2016  . Blood pressure elevated without history of HTN 10/25/2015  . Multiple sclerosis ([place]) 10/02/2015  . Chronic left shoulder pain 07/19/2015  . Multiple sclerosis exacerbation ([place]) 07/14/2015  . MS (multiple sclerosis) ([place]) 11/26/2014  . Increased body mass index 11/26/2014  . HPV test positive 11/26/2014  . Status post laparoscopic supracervical hysterectomy 11/26/2014  . Galactorrhea 11/26/2014  . Tobacco user 11/26/2014  . Back ache 05/21/2014  . Adiposity 05/21/2014  . Disordered sleep 05/21/2014  . Muscle spasticity 05/21/2014  . Calculus of kidney 12/09/2013  . Renal colic 72/55/0016  . Hypercholesteremia 08/19/2013  . Hereditary and idiopathic neuropathy 08/19/2013  . Hypercholesterolemia without hypertriglyceridemia 08/19/2013  . Bladder  infection, chronic 07/25/2012  . Disorder of bladder function 07/25/2012  . Incomplete bladder emptying 07/25/2012  . Microscopic hematuria 07/25/2012   Janna Arch, PT, DPT   11/14/2017, 1:43 PM  [place] MAIN [facility] SERVICES [address] [place], [place], [address] Phone: [phone]   Fax:  [phone]  Name: Lisa Williams MRN: [id number] Date of Birth: 26-Feb-1961

## 2017-11-26 ENCOUNTER — Ambulatory Visit: Payer: PPO

## 2017-11-26 DIAGNOSIS — R2681 Unsteadiness on feet: Secondary | ICD-10-CM

## 2017-11-26 DIAGNOSIS — M6281 Muscle weakness (generalized): Secondary | ICD-10-CM | POA: Diagnosis not present

## 2017-11-26 DIAGNOSIS — R2689 Other abnormalities of gait and mobility: Secondary | ICD-10-CM

## 2017-11-26 DIAGNOSIS — G35 Multiple sclerosis: Secondary | ICD-10-CM | POA: Diagnosis not present

## 2017-11-26 NOTE — Therapy (Signed)
[place] MAIN [facility] SERVICES [place], [place], [address] Phone: [phone]   Fax:  [phone]  Physical Therapy Treatment  Patient Details  Name: Lisa Williams MRN: [id number] Date of Birth: 06-01-1960 Referring Provider: Gurney Maxin, MD   Encounter Date: 11/26/2017  PT End of Session - 11/26/17 1127    Visit Number  25    Number of Visits  36    Date for PT Re-Evaluation  12/26/17    Authorization Type  2/10 ( PN start 7/10)    PT Start Time  1110    PT Stop Time  1150    PT Time Calculation (min)  40 min    Activity Tolerance  Patient tolerated treatment well;Patient limited by fatigue;Other (comment)    Behavior During Therapy  WFL for tasks assessed/performed       Past Medical History:  Diagnosis Date  . Abdominal pain, right upper quadrant   . Back pain   . Calculus of kidney 12/09/2013  . Chronic back pain    unspecified  . Chronic left shoulder pain 07/19/2015  . Functional disorder of bladder    other  . Galactorrhea 11/26/2014   Chronic   . Hereditary and idiopathic neuropathy 08/19/2013  . HPV test positive   . Hypercholesteremia 08/19/2013  . Incomplete bladder emptying   . Microscopic hematuria   . MS (multiple sclerosis) ([place])   . Muscle spasticity 05/21/2014  . Nonspecific findings on examination of urine    other  . Osteopenia   . Status post laparoscopic supracervical hysterectomy 11/26/2014  . Tobacco user 11/26/2014  . Wrist fracture     Past Surgical History:  Procedure Laterality Date  . bilateral tubal ligation  1996  . BREAST CYST EXCISION Left 2002  . KNEE SURGERY     right  . LAPAROSCOPIC SUPRACERVICAL HYSTERECTOMY  08/05/2013  . ORIF WRIST FRACTURE Left 01/17/2017   Procedure: OPEN REDUCTION INTERNAL FIXATION (ORIF) WRIST FRACTURE;  Surgeon: Lovell Sheehan, MD;  Location: [facility] ORS;  Service: Orthopedics;  Laterality: Left;  . TUBAL LIGATION Bilateral   . VAGINAL  HYSTERECTOMY  03/2006    There were no vitals filed for this visit.  Subjective Assessment - 11/26/17 1115    Subjective  Patient reports having a wonderful trip. Was able to use the bathroom and bed without problems. Reports being able to get up and down from hotel bed.     Patient is accompained by:  Family member    Pertinent History  Pt is a 57 y/o F who presents with BLE weakness and difficulty ambulating due to h/o MS that was diagnosed in 1995. Pt denies any pain associated. Pt was admitted to the hospital in November 2018 due to MS exacerbation. Nov-Dec to HHPT-->[facility] until end of Jan. After rehab the pt was able to ambulate up to 100 ft with the RW. Pt has Bil AFO, her therapist at [place] thinks she needs a new AFO as her feet still drag when she ambulates, pt reports HHPT mentioned a spring AFO. Pt has custom AFOs that were originally made 4 years ago. Goals: improve balance, to be able to get into the shower with her tub bench without assist, to be able to get up 2 steps at sister's home with railings on both sides but too far apart to reach both sides, to be able to drive adapted car. Pt is staying at other sister's house with one step to get  inside and currently picking up L leg with UEs and then stepping in. Pt steps down leading with LLE. Pt wants to work on walking side to side as this is challenging and she had not yet progressed to this at rehab. Pt able to get in/out the car with assist only to bring BLEs into the car. Hoping to drive by June or July with adapted car (already has one). Pt needs assist bringing LEs into and out of hospital bed. Pt able to perform sit>stand without assist from [facility] with a pillow on the seat for a boost. Pt denies any falls in the past 6 months. Pt has been increasing her time alone at home up to a few hours at a time to become more independent. Pt has both manual and power WC. Uses power WC at home, uses manual WC out in community. Pt able to self  propel manual WC. Pt needs assist putting pants around ankles but she can pull them up. Pt can put on her shoes but needs help getting her leg up to do so. Pt able to tie her shoelaces on her own. Pt has a tub/shower unit at home with a tub bench. No grab bars in the shower. Has hand held shower head. Pt currently taking a sponge bath. Pt has a regular height toilet with a BSC over top. Pt with h/o L wrist fx with no precautions, pt wore her soft brace to evaluation for comfort but reports her wrist has healed and is doing well following OT.     Limitations  Lifting;Standing;Walking;House hold activities    How long can you stand comfortably?  3 minutes without UE assist, limited by fatigue    How long can you walk comfortably?  100 ft, limited by fatigue    Patient Stated Goals  see above    Currently in Pain?  No/denies       Airex pad: toe taps BUE support 10x each leg, cues for bringing knee to ceiling (hip flexion) prior to taps.     Ambulation in // bars 10' x4 lengths,  Cues to avoid trunk flexion and to initiate step by moving pelvis forward. verbal cues for hip flexion and knee extension    March 10x each leg BUE support;  Improved clearance of R foot with cues for knee to sky  ; 2 sets      Sit to stand from elevated seat with UE assist on // bars x5    Seated:  Balloon pass with PT to challenge patient reach within and outside of BOS for core activation. LE supported 3 minutes   Seated knee extensions, 12 x each leg, no support needed.   Seated adduction resistance against hand 10x 3 second holds  BLE  Sit to stand: focus on primarily utilizing LE's with tactile cueing to RLE for support. Decreased need for assistance demonstrating carryover from previous session.      Pt. response to medical necessity: Patient will continue to benefit from skilled PT to improve mobility and independence                          PT Education - 11/26/17 1123     Education provided  Yes    Education Details  exercise technique     Person(s) Educated  Patient    Methods  Explanation;Demonstration;Verbal cues    Comprehension  Verbalized understanding;Returned demonstration       PT Short Term  Goals - 11/14/17 1339      PT SHORT TERM GOAL #1   Title  Pt will be completed HEP with min assist from caregiver at least 4 days/wk for improved carryover between sessions    Baseline  HEP compliant     Time  2    Period  Weeks    Status  Partially Met      PT SHORT TERM GOAL #2   Title  Patient will report a worst pain of 6/10 on VAS in cervical spine to improve tolerance with ADLs and reduced symptoms with activities.     Baseline  4/30: 9/10 5/21: 0/10 pain    Time  2    Period  Weeks    Status  Achieved      PT SHORT TERM GOAL #3   Title  Patient will perform cervical rotation at least 50 degrees, flexion and extension to 70 degrees, and sidebending to 40 degrees without pain    Baseline  4/30: R 50 L 34 Rotation, R 26 L 35 sb, Extension 35 flex 35 ; 5/21: extension 46, SB R 41 L 40, rotation R 62 L 61,     Time  2    Period  Weeks    Status  Achieved        PT Long Term Goals - 11/14/17 1147      PT LONG TERM GOAL #1   Title  Pt's 5xSTS will impove to equal to or below 2 minutes to demonstrate improved balance and BLE strength    Baseline  3.02 minutes; 5/15: 1 min 30 seconds    Time  6    Period  Weeks    Status  Achieved      PT LONG TERM GOAL #2   Title  Pt's 77mT will improve to at least 0.5 m/s for improved safety ambulating in home    Baseline  0.14 m/s; 5/15: .270m  7/10: .36 m/s     Time  8    Period  Weeks    Status  Partially Met    Target Date  12/26/17      PT LONG TERM GOAL #3   Title  Pt's Bil hip F strength will improve to at least 2+/5 BLEs for improved gait mechanics and safety    Baseline  L: 2/5, R: 1/5 7/10: 2/5 bilaterally     Time  8    Period  Weeks    Status  Partially Met    Target Date  12/26/17       PT LONG TERM GOAL #4   Title  Pt's ABC Scale will improve to at least 40% to demonstrate improved balance    Baseline  28.13% 5/15; 24.4% 7/10: 23.4%     Time  6    Period  Weeks    Status  Partially Met    Target Date  12/26/17      PT LONG TERM GOAL #5   Title  Pt will be able to perform car transfer without assist to work towards pt's goal of returning to driving    Baseline  only needs help with one leg to put onto running board to get into sisters car    Time  6    Period  Weeks    Status  Partially Met      Additional Long Term Goals   Additional Long Term Goals  Yes      PT LONG TERM GOAL #6  Title  Pt will be able to perform tub bench into/out of shower transfer without assist for improved independence with this task    Baseline  challenged with steps at this time, getting better, stands and gives sponge bath independently now     Time  6    Period  Weeks    Status  Partially Met      PT LONG TERM GOAL #7   Title  Patient will perform cervical rotation at least 60 degrees, flexion and extension to 70 degrees, and sidebending to 40 degrees without pain    Baseline  4/30: R 50 L 34 Rotation, R 26 L 35 sb, Extension 35 flex 35; 5/21: extension 46, SB R 41 L 40, rotation R 62 L 61,     Time  4    Period  Weeks    Status  Achieved      PT LONG TERM GOAL #8   Title  Patient will improve NDI to <28% to reduce disability to mild  percieved disability for improved quality of life.     Baseline  4/30: 56% severe disability 5/21: 10%    Time  4    Period  Weeks    Status  Achieved      PT LONG TERM GOAL  #9   TITLE  Patient will report a worst pain of 3/10 on VAS in  cervical spine  to improve tolerance with ADLs and reduced symptoms with activities.     Baseline  4/30: 9/10 5/21: 0/10 in neck     Time  4    Period  Weeks    Status  Achieved      PT LONG TERM GOAL  #10   TITLE  Pt's 5xSTS will impove to equal to or below 50 seconds to demonstrate improved balance  and BLE strength    Baseline  5/15: 1 min 30.9 seconds 7/10: 64 seconds     Time  6    Period  Weeks    Status  Partially Met    Target Date  12/26/17      PT LONG TERM GOAL  #11   TITLE  Patient will descend/ascend down a handicap ramp  with RW and with supervision to increase independence in home environment.     Baseline  7/10 can ascend, unable to descend     Time  6    Period  Weeks    Status  New    Target Date  12/26/17            Plan - 11/26/17 1223    Clinical Impression Statement  Patient demonstrates good carryover through week trip away with continued use of legs with sit to stands and clearance of LE's with ambulation. Patient's coordination is improving with increased muscle recruitment and speed of recruitment. Patient will continue to benefit from skilled PT to improve mobility and independence    Rehab Potential  Good    PT Frequency  2x / week    PT Duration  8 weeks    PT Treatment/Interventions  ADLs/Self Care Home Management;Aquatic Therapy;Biofeedback;Cryotherapy;Electrical Stimulation;Iontophoresis 54m/ml Dexamethasone;Traction;Moist Heat;Ultrasound;DME Instruction;Gait training;Stair training;Functional mobility training;Therapeutic activities;Therapeutic exercise;Balance training;Neuromuscular re-education;Patient/family education;Orthotic Fit/Training;Wheelchair mobility training;Manual techniques;Compression bandaging;Passive range of motion;Dry needling;Energy conservation;Taping;Splinting    PT Next Visit Plan  assess need for new AFOs or other orthotic to assist with ambulation, initiate strengthening program, practice and improve technique for bed mobility and sit<>stand    PT Home Exercise Plan  to  initiate next session (please incorporate hip flexor and adductor stretches in HEP if possible -KG)    Consulted and Agree with Plan of Care  Patient       Patient will benefit from skilled therapeutic intervention in order to improve the following  deficits and impairments:  Abnormal gait, Decreased activity tolerance, Decreased balance, Decreased coordination, Decreased endurance, Decreased knowledge of use of DME, Decreased mobility, Decreased range of motion, Decreased safety awareness, Decreased strength, Difficulty walking, Impaired perceived functional ability, Impaired sensation, Impaired tone, Impaired UE functional use, Improper body mechanics, Postural dysfunction  Visit Diagnosis: Muscle weakness (generalized)  Other abnormalities of gait and mobility  Unsteadiness on feet     Problem List Patient Active Problem List   Diagnosis Date Noted  . Wrist fracture 01/16/2017  . Health care maintenance 01/24/2016  . Blood pressure elevated without history of HTN 10/25/2015  . Multiple sclerosis ([place]) 10/02/2015  . Chronic left shoulder pain 07/19/2015  . Multiple sclerosis exacerbation ([place]) 07/14/2015  . MS (multiple sclerosis) ([place]) 11/26/2014  . Increased body mass index 11/26/2014  . HPV test positive 11/26/2014  . Status post laparoscopic supracervical hysterectomy 11/26/2014  . Galactorrhea 11/26/2014  . Tobacco user 11/26/2014  . Back ache 05/21/2014  . Adiposity 05/21/2014  . Disordered sleep 05/21/2014  . Muscle spasticity 05/21/2014  . Calculus of kidney 12/09/2013  . Renal colic 25/85/2778  . Hypercholesteremia 08/19/2013  . Hereditary and idiopathic neuropathy 08/19/2013  . Hypercholesterolemia without hypertriglyceridemia 08/19/2013  . Bladder infection, chronic 07/25/2012  . Disorder of bladder function 07/25/2012  . Incomplete bladder emptying 07/25/2012  . Microscopic hematuria 07/25/2012  Janna Arch, PT, DPT   11/26/2017, 12:24 PM  [place] MAIN [facility] SERVICES [address] [place], [place], [address] Phone: [phone]   Fax:  [phone]  Name: Lisa Williams MRN: [id number] Date of Birth: 14-Jul-1960

## 2017-11-30 ENCOUNTER — Ambulatory Visit: Payer: PPO

## 2017-11-30 DIAGNOSIS — R2681 Unsteadiness on feet: Secondary | ICD-10-CM

## 2017-11-30 DIAGNOSIS — M5412 Radiculopathy, cervical region: Secondary | ICD-10-CM

## 2017-11-30 DIAGNOSIS — M6281 Muscle weakness (generalized): Secondary | ICD-10-CM | POA: Diagnosis not present

## 2017-11-30 DIAGNOSIS — R2689 Other abnormalities of gait and mobility: Secondary | ICD-10-CM

## 2017-12-03 ENCOUNTER — Ambulatory Visit: Payer: PPO

## 2017-12-03 DIAGNOSIS — R2689 Other abnormalities of gait and mobility: Secondary | ICD-10-CM

## 2017-12-03 DIAGNOSIS — M6281 Muscle weakness (generalized): Secondary | ICD-10-CM | POA: Diagnosis not present

## 2017-12-03 DIAGNOSIS — R2681 Unsteadiness on feet: Secondary | ICD-10-CM

## 2017-12-03 NOTE — Therapy (Signed)
[place] MAIN [facility] SERVICES [address] [place], [place], [address] Phone: [phone]   Fax:  [phone]  Physical Therapy Treatment  Patient Details  Name: Lisa Williams MRN: [id number] Date of Birth: 12-01-1960 Referring Provider: Gurney Maxin, MD   Encounter Date: 11/30/2017  PT End of Session - 12/03/17 1614    Visit Number  27    Number of Visits  36    Date for PT Re-Evaluation  12/26/17    Authorization Type  3/10 ( PN start 7/10)    PT Start Time  1000    PT Stop Time  1100    PT Time Calculation (min)  60 min    Activity Tolerance  Patient tolerated treatment well    Behavior During Therapy  [facility] for tasks assessed/performed       Past Medical History:  Diagnosis Date  . Abdominal pain, right upper quadrant   . Back pain   . Calculus of kidney 12/09/2013  . Chronic back pain    unspecified  . Chronic left shoulder pain 07/19/2015  . Functional disorder of bladder    other  . Galactorrhea 11/26/2014   Chronic   . Hereditary and idiopathic neuropathy 08/19/2013  . HPV test positive   . Hypercholesteremia 08/19/2013  . Incomplete bladder emptying   . Microscopic hematuria   . MS (multiple sclerosis) ([place])   . Muscle spasticity 05/21/2014  . Nonspecific findings on examination of urine    other  . Osteopenia   . Status post laparoscopic supracervical hysterectomy 11/26/2014  . Tobacco user 11/26/2014  . Wrist fracture     Past Surgical History:  Procedure Laterality Date  . bilateral tubal ligation  1996  . BREAST CYST EXCISION Left 2002  . KNEE SURGERY     right  . LAPAROSCOPIC SUPRACERVICAL HYSTERECTOMY  08/05/2013  . ORIF WRIST FRACTURE Left 01/17/2017   Procedure: OPEN REDUCTION INTERNAL FIXATION (ORIF) WRIST FRACTURE;  Surgeon: Lovell Sheehan, MD;  Location: [facility] ORS;  Service: Orthopedics;  Laterality: Left;  . TUBAL LIGATION Bilateral   . VAGINAL HYSTERECTOMY  03/2006    There were no vitals  filed for this visit.  Pelvic Floor Physical Therapy Treatment Note  SCREENING  Changes in medications, allergies, or medical history?: no   SUBJECTIVE  Patient reports: She has not had any leakage and has continued to have BM's about once a week and has felt that she is still a little bit looser in her hips from where we did TP release at last visit. She would like to do dry needling at today's visit.   Precautions:  MS  Pain update: Pt makes no c/o pain today.  Patient Goals: Decrease Urinary incontinence and constipation.   OBJECTIVE  Changes in:  Palpation: TTP through B adductors with great amounts of tightness noted B.  Gait Analysis: Pt. Able to stand and AMB IND from wheelchair ~ 5 feet to treatment table with walker unassisted.   INTERVENTIONS THIS SESSION: Manual: Performed STM and TP release to B adductors to decrease spasm and allow for decreased referred tightness into the PFM for decreased constipation and urinary incontinence as well as improved ability to use LE for AMB.  Dry Needle: Performed standard approach TPDN with a .30x49m needle to B adductors to decrease spasm and allow for decreased referred tightness into the PFM for decreased constipation and urinary incontinence as well as improved ability to use LE for AMB.   Total time:  60 min.                     Trigger Point Dry Needling - 12/03/17 1618    Consent Given?  Yes  (Pended)     Education Handout Provided  No  (Pended)     Muscles Treated Lower Body  Adductor longus/brevius/maximus  (Pended)            PT Education - 12/03/17 1612    Education provided  Yes    Education Details  What to expect following Dry needling, how to minimize soreness.     Person(s) Educated  Patient    Methods  Explanation    Comprehension  Verbalized understanding       PT Short Term Goals - 11/14/17 1339      PT SHORT TERM GOAL #1   Title  Pt will be completed HEP with min assist  from caregiver at least 4 days/wk for improved carryover between sessions    Baseline  HEP compliant     Time  2    Period  Weeks    Status  Partially Met      PT SHORT TERM GOAL #2   Title  Patient will report a worst pain of 6/10 on VAS in cervical spine to improve tolerance with ADLs and reduced symptoms with activities.     Baseline  4/30: 9/10 5/21: 0/10 pain    Time  2    Period  Weeks    Status  Achieved      PT SHORT TERM GOAL #3   Title  Patient will perform cervical rotation at least 50 degrees, flexion and extension to 70 degrees, and sidebending to 40 degrees without pain    Baseline  4/30: R 50 L 34 Rotation, R 26 L 35 sb, Extension 35 flex 35 ; 5/21: extension 46, SB R 41 L 40, rotation R 62 L 61,     Time  2    Period  Weeks    Status  Achieved        PT Long Term Goals - 11/14/17 1147      PT LONG TERM GOAL #1   Title  Pt's 5xSTS will impove to equal to or below 2 minutes to demonstrate improved balance and BLE strength    Baseline  3.02 minutes; 5/15: 1 min 30 seconds    Time  6    Period  Weeks    Status  Achieved      PT LONG TERM GOAL #2   Title  Pt's 67mT will improve to at least 0.5 m/s for improved safety ambulating in home    Baseline  0.14 m/s; 5/15: .270m  7/10: .36 m/s     Time  8    Period  Weeks    Status  Partially Met    Target Date  12/26/17      PT LONG TERM GOAL #3   Title  Pt's Bil hip F strength will improve to at least 2+/5 BLEs for improved gait mechanics and safety    Baseline  L: 2/5, R: 1/5 7/10: 2/5 bilaterally     Time  8    Period  Weeks    Status  Partially Met    Target Date  12/26/17      PT LONG TERM GOAL #4   Title  Pt's ABC Scale will improve to at least 40% to demonstrate improved balance    Baseline  28.13%  5/15; 24.4% 7/10: 23.4%     Time  6    Period  Weeks    Status  Partially Met    Target Date  12/26/17      PT LONG TERM GOAL #5   Title  Pt will be able to perform car transfer without assist to work  towards pt's goal of returning to driving    Baseline  only needs help with one leg to put onto running board to get into sisters car    Time  6    Period  Weeks    Status  Partially Met      Additional Long Term Goals   Additional Long Term Goals  Yes      PT LONG TERM GOAL #6   Title  Pt will be able to perform tub bench into/out of shower transfer without assist for improved independence with this task    Baseline  challenged with steps at this time, getting better, stands and gives sponge bath independently now     Time  6    Period  Weeks    Status  Partially Met      PT LONG TERM GOAL #7   Title  Patient will perform cervical rotation at least 60 degrees, flexion and extension to 70 degrees, and sidebending to 40 degrees without pain    Baseline  4/30: R 50 L 34 Rotation, R 26 L 35 sb, Extension 35 flex 35; 5/21: extension 46, SB R 41 L 40, rotation R 62 L 61,     Time  4    Period  Weeks    Status  Achieved      PT LONG TERM GOAL #8   Title  Patient will improve NDI to <28% to reduce disability to mild  percieved disability for improved quality of life.     Baseline  4/30: 56% severe disability 5/21: 10%    Time  4    Period  Weeks    Status  Achieved      PT LONG TERM GOAL  #9   TITLE  Patient will report a worst pain of 3/10 on VAS in  cervical spine  to improve tolerance with ADLs and reduced symptoms with activities.     Baseline  4/30: 9/10 5/21: 0/10 in neck     Time  4    Period  Weeks    Status  Achieved      PT LONG TERM GOAL  #10   TITLE  Pt's 5xSTS will impove to equal to or below 50 seconds to demonstrate improved balance and BLE strength    Baseline  5/15: 1 min 30.9 seconds 7/10: 64 seconds     Time  6    Period  Weeks    Status  Partially Met    Target Date  12/26/17      PT LONG TERM GOAL  #11   TITLE  Patient will descend/ascend down a handicap ramp  with RW and with supervision to increase independence in home environment.     Baseline  7/10 can  ascend, unable to descend     Time  6    Period  Weeks    Status  New    Target Date  12/26/17            Plan - 12/03/17 1614    Clinical Impression Statement  Pt. responded well to Dry needling, stating that she prefers this to TP release  and feels that it is helpful. She deonstrated significantly less spasms in her adductors following treatment and understanding on how to perform stretches to keep muslces long. Continue per POC.    Clinical Presentation  Evolving    Clinical Decision Making  Moderate    Rehab Potential  Good    PT Frequency  2x / week    PT Duration  8 weeks    PT Treatment/Interventions  ADLs/Self Care Home Management;Aquatic Therapy;Biofeedback;Cryotherapy;Electrical Stimulation;Iontophoresis 80m/ml Dexamethasone;Traction;Moist Heat;Ultrasound;DME Instruction;Gait training;Stair training;Functional mobility training;Therapeutic activities;Therapeutic exercise;Balance training;Neuromuscular re-education;Patient/family education;Orthotic Fit/Training;Wheelchair mobility training;Manual techniques;Compression bandaging;Passive range of motion;Dry needling;Energy conservation;Taping;Splinting    PT Next Visit Plan  assess need for new AFOs or other orthotic to assist with ambulation, initiate strengthening program, practice and improve technique for bed mobility and sit<>stand    PT Home Exercise Plan  to initiate next session (please incorporate hip flexor and adductor stretches in HEP if possible -KG)    Consulted and Agree with Plan of Care  Patient       Patient will benefit from skilled therapeutic intervention in order to improve the following deficits and impairments:  Abnormal gait, Decreased activity tolerance, Decreased balance, Decreased coordination, Decreased endurance, Decreased knowledge of use of DME, Decreased mobility, Decreased range of motion, Decreased safety awareness, Decreased strength, Difficulty walking, Impaired perceived functional ability,  Impaired sensation, Impaired tone, Impaired UE functional use, Improper body mechanics, Postural dysfunction  Visit Diagnosis: Muscle weakness (generalized)  Other abnormalities of gait and mobility  Unsteadiness on feet  Radiculopathy, cervical region     Problem List Patient Active Problem List   Diagnosis Date Noted  . Wrist fracture 01/16/2017  . Health care maintenance 01/24/2016  . Blood pressure elevated without history of HTN 10/25/2015  . Multiple sclerosis ([place] 10/02/2015  . Chronic left shoulder pain 07/19/2015  . [place] 07/14/2015  . MS (multiple sclerosis) ([place] 11/26/2014  . Increased body mass index 11/26/2014  . HPV test positive 11/26/2014  . Status post laparoscopic supracervical hysterectomy 11/26/2014  . Galactorrhea 11/26/2014  . Tobacco user 11/26/2014  . Back ache 05/21/2014  . Adiposity 05/21/2014  . Disordered sleep 05/21/2014  . Muscle spasticity 05/21/2014  . Calculus of kidney 12/09/2013  . Renal colic 016/02/9603 . Hypercholesteremia 08/19/2013  . Hereditary and idiopathic neuropathy 08/19/2013  . Hypercholesterolemia without hypertriglyceridemia 08/19/2013  . Bladder infection, chronic 07/25/2012  . Disorder of bladder function 07/25/2012  . Incomplete bladder emptying 07/25/2012  . Microscopic hematuria 07/25/2012   KWilla RoughDPT, ATC KWilla Rough7/29/2019, 4:18 PM  [place] [facility]SERVICES [address]RI[address] [place] [address]Phone: [phone]  Fax:  [phone] Name: Lisa Williams MRN: [id number] of Birth: 611-Sep-1962

## 2017-12-03 NOTE — Therapy (Signed)
[place] MAIN [facility] SERVICES [address] [place], [place], [address] Phone: [phone]   Fax:  [phone]  Physical Therapy Treatment  Patient Details  Name: Lisa Williams MRN: [id number] Date of Birth: 25-Feb-1961 Referring Provider: Gurney Maxin, MD   Encounter Date: 12/03/2017  PT End of Session - 12/03/17 1214    Visit Number  26    Number of Visits  36    Date for PT Re-Evaluation  12/26/17    Authorization Type  3/10 ( PN start 7/10)    PT Start Time  1115    PT Stop Time  1201    PT Time Calculation (min)  46 min    Activity Tolerance  Patient tolerated treatment well;Patient limited by fatigue;Other (comment)    Behavior During Therapy  WFL for tasks assessed/performed       Past Medical History:  Diagnosis Date  . Abdominal pain, right upper quadrant   . Back pain   . Calculus of kidney 12/09/2013  . Chronic back pain    unspecified  . Chronic left shoulder pain 07/19/2015  . Functional disorder of bladder    other  . Galactorrhea 11/26/2014   Chronic   . Hereditary and idiopathic neuropathy 08/19/2013  . HPV test positive   . Hypercholesteremia 08/19/2013  . Incomplete bladder emptying   . Microscopic hematuria   . MS (multiple sclerosis) ([place])   . Muscle spasticity 05/21/2014  . Nonspecific findings on examination of urine    other  . Osteopenia   . Status post laparoscopic supracervical hysterectomy 11/26/2014  . Tobacco user 11/26/2014  . Wrist fracture     Past Surgical History:  Procedure Laterality Date  . bilateral tubal ligation  1996  . BREAST CYST EXCISION Left 2002  . KNEE SURGERY     right  . LAPAROSCOPIC SUPRACERVICAL HYSTERECTOMY  08/05/2013  . ORIF WRIST FRACTURE Left 01/17/2017   Procedure: OPEN REDUCTION INTERNAL FIXATION (ORIF) WRIST FRACTURE;  Surgeon: Lovell Sheehan, MD;  Location: [facility] ORS;  Service: Orthopedics;  Laterality: Left;  . TUBAL LIGATION Bilateral   . VAGINAL  HYSTERECTOMY  03/2006    There were no vitals filed for this visit.  Subjective Assessment - 12/03/17 1213    Subjective  Patient reports being sore after dry  needling but no marked bruising or soreness the following day. Has been getting up and down easier and able to get in and out of bed independently.     Patient is accompained by:  Family member    Pertinent History  Pt is a 57 y/o F who presents with BLE weakness and difficulty ambulating due to h/o MS that was diagnosed in 1995. Pt denies any pain associated. Pt was admitted to the hospital in November 2018 due to MS exacerbation. Nov-Dec to HHPT-->[facility] until end of Jan. After rehab the pt was able to ambulate up to 100 ft with the RW. Pt has Bil AFO, her therapist at [place] thinks she needs a new AFO as her feet still drag when she ambulates, pt reports HHPT mentioned a spring AFO. Pt has custom AFOs that were originally made 4 years ago. Goals: improve balance, to be able to get into the shower with her tub bench without assist, to be able to get up 2 steps at sister's home with railings on both sides but too far apart to reach both sides, to be able to drive adapted car. Pt is staying at other  sister's house with one step to get inside and currently picking up L leg with UEs and then stepping in. Pt steps down leading with LLE. Pt wants to work on walking side to side as this is challenging and she had not yet progressed to this at rehab. Pt able to get in/out the car with assist only to bring BLEs into the car. Hoping to drive by June or July with adapted car (already has one). Pt needs assist bringing LEs into and out of hospital bed. Pt able to perform sit>stand without assist from [facility] with a pillow on the seat for a boost. Pt denies any falls in the past 6 months. Pt has been increasing her time alone at home up to a few hours at a time to become more independent. Pt has both manual and power WC. Uses power WC at home, uses manual  WC out in community. Pt able to self propel manual WC. Pt needs assist putting pants around ankles but she can pull them up. Pt can put on her shoes but needs help getting her leg up to do so. Pt able to tie her shoelaces on her own. Pt has a tub/shower unit at home with a tub bench. No grab bars in the shower. Has hand held shower head. Pt currently taking a sponge bath. Pt has a regular height toilet with a BSC over top. Pt with h/o L wrist fx with no precautions, pt wore her soft brace to evaluation for comfort but reports her wrist has healed and is doing well following OT.     Limitations  Lifting;Standing;Walking;House hold activities    How long can you stand comfortably?  3 minutes without UE assist, limited by fatigue    How long can you walk comfortably?  100 ft, limited by fatigue    Patient Stated Goals  see above    Currently in Pain?  No/denies       Ambulation in // bars 10' x4 lengths,  Cues to avoid trunk flexion and to initiate step by moving pelvis forward. verbal cues for hip flexion and knee extension ; improved step length and decreased foot drag    March 10x each leg BUE support;  Improved clearance of R foot with cues for knee to sky  ; 2 sets    4" step toe taps 10x each leg BUE support, cues for bringing knees to ceiling (hip flexion) ; unable to bring foot on top of step.    balloon taps with SUE support 5x Sit to stand from elevated seat with UE assist on // bars x5   Standing with decreased UE support : 2 minutes: SUE support with frequent BUE support with face shaking.     Seated:  Balloon pass with PT to challenge patient reach within and outside of BOS for core activation. LE supported 3 minutes x2 trials    Seated knee extensions, 12 x each leg, no support needed.    Seated adduction resistance against hand 10x 3 second holds  BLE    Seated abduction resistance against hand 10x 3 second holds   Pt. response to medical necessity: Patient will continue to  benefit from skilled PT to improve mobility and independence                           PT Education - 12/03/17 1213    Education provided  Yes    Education Details  exercise  technique     Person(s) Educated  Patient    Methods  Explanation;Demonstration;Verbal cues    Comprehension  Verbalized understanding;Returned demonstration       PT Short Term Goals - 11/14/17 1339      PT SHORT TERM GOAL #1   Title  Pt will be completed HEP with min assist from caregiver at least 4 days/wk for improved carryover between sessions    Baseline  HEP compliant     Time  2    Period  Weeks    Status  Partially Met      PT SHORT TERM GOAL #2   Title  Patient will report a worst pain of 6/10 on VAS in cervical spine to improve tolerance with ADLs and reduced symptoms with activities.     Baseline  4/30: 9/10 5/21: 0/10 pain    Time  2    Period  Weeks    Status  Achieved      PT SHORT TERM GOAL #3   Title  Patient will perform cervical rotation at least 50 degrees, flexion and extension to 70 degrees, and sidebending to 40 degrees without pain    Baseline  4/30: R 50 L 34 Rotation, R 26 L 35 sb, Extension 35 flex 35 ; 5/21: extension 46, SB R 41 L 40, rotation R 62 L 61,     Time  2    Period  Weeks    Status  Achieved        PT Long Term Goals - 11/14/17 1147      PT LONG TERM GOAL #1   Title  Pt's 5xSTS will impove to equal to or below 2 minutes to demonstrate improved balance and BLE strength    Baseline  3.02 minutes; 5/15: 1 min 30 seconds    Time  6    Period  Weeks    Status  Achieved      PT LONG TERM GOAL #2   Title  Pt's 55mT will improve to at least 0.5 m/s for improved safety ambulating in home    Baseline  0.14 m/s; 5/15: .265m  7/10: .36 m/s     Time  8    Period  Weeks    Status  Partially Met    Target Date  12/26/17      PT LONG TERM GOAL #3   Title  Pt's Bil hip F strength will improve to at least 2+/5 BLEs for improved gait mechanics  and safety    Baseline  L: 2/5, R: 1/5 7/10: 2/5 bilaterally     Time  8    Period  Weeks    Status  Partially Met    Target Date  12/26/17      PT LONG TERM GOAL #4   Title  Pt's ABC Scale will improve to at least 40% to demonstrate improved balance    Baseline  28.13% 5/15; 24.4% 7/10: 23.4%     Time  6    Period  Weeks    Status  Partially Met    Target Date  12/26/17      PT LONG TERM GOAL #5   Title  Pt will be able to perform car transfer without assist to work towards pt's goal of returning to driving    Baseline  only needs help with one leg to put onto running board to get into sisters car    Time  6    Period  Weeks  Status  Partially Met      Additional Long Term Goals   Additional Long Term Goals  Yes      PT LONG TERM GOAL #6   Title  Pt will be able to perform tub bench into/out of shower transfer without assist for improved independence with this task    Baseline  challenged with steps at this time, getting better, stands and gives sponge bath independently now     Time  6    Period  Weeks    Status  Partially Met      PT LONG TERM GOAL #7   Title  Patient will perform cervical rotation at least 60 degrees, flexion and extension to 70 degrees, and sidebending to 40 degrees without pain    Baseline  4/30: R 50 L 34 Rotation, R 26 L 35 sb, Extension 35 flex 35; 5/21: extension 46, SB R 41 L 40, rotation R 62 L 61,     Time  4    Period  Weeks    Status  Achieved      PT LONG TERM GOAL #8   Title  Patient will improve NDI to <28% to reduce disability to mild  percieved disability for improved quality of life.     Baseline  4/30: 56% severe disability 5/21: 10%    Time  4    Period  Weeks    Status  Achieved      PT LONG TERM GOAL  #9   TITLE  Patient will report a worst pain of 3/10 on VAS in  cervical spine  to improve tolerance with ADLs and reduced symptoms with activities.     Baseline  4/30: 9/10 5/21: 0/10 in neck     Time  4    Period  Weeks     Status  Achieved      PT LONG TERM GOAL  #10   TITLE  Pt's 5xSTS will impove to equal to or below 50 seconds to demonstrate improved balance and BLE strength    Baseline  5/15: 1 min 30.9 seconds 7/10: 64 seconds     Time  6    Period  Weeks    Status  Partially Met    Target Date  12/26/17      PT LONG TERM GOAL  #11   TITLE  Patient will descend/ascend down a handicap ramp  with RW and with supervision to increase independence in home environment.     Baseline  7/10 can ascend, unable to descend     Time  6    Period  Weeks    Status  New    Target Date  12/26/17            Plan - 12/03/17 1216    Clinical Impression Statement  Patient demonstrated improved coordination and smoothness of sit to stand with decreased reliance upon UE's. Patient was able to stand with SUE support and reach inside and outside of BOS.  Patient will continue to benefit from skilled PT to improve mobility and independence     Rehab Potential  Good    PT Frequency  2x / week    PT Duration  8 weeks    PT Treatment/Interventions  ADLs/Self Care Home Management;Aquatic Therapy;Biofeedback;Cryotherapy;Electrical Stimulation;Iontophoresis 69m/ml Dexamethasone;Traction;Moist Heat;Ultrasound;DME Instruction;Gait training;Stair training;Functional mobility training;Therapeutic activities;Therapeutic exercise;Balance training;Neuromuscular re-education;Patient/family education;Orthotic Fit/Training;Wheelchair mobility training;Manual techniques;Compression bandaging;Passive range of motion;Dry needling;Energy conservation;Taping;Splinting    PT Next Visit Plan  assess need for  new AFOs or other orthotic to assist with ambulation, initiate strengthening program, practice and improve technique for bed mobility and sit<>stand    PT Home Exercise Plan  to initiate next session (please incorporate hip flexor and adductor stretches in HEP if possible -KG)    Consulted and Agree with Plan of Care  Patient        Patient will benefit from skilled therapeutic intervention in order to improve the following deficits and impairments:  Abnormal gait, Decreased activity tolerance, Decreased balance, Decreased coordination, Decreased endurance, Decreased knowledge of use of DME, Decreased mobility, Decreased range of motion, Decreased safety awareness, Decreased strength, Difficulty walking, Impaired perceived functional ability, Impaired sensation, Impaired tone, Impaired UE functional use, Improper body mechanics, Postural dysfunction  Visit Diagnosis: Muscle weakness (generalized)  Other abnormalities of gait and mobility  Unsteadiness on feet     Problem List Patient Active Problem List   Diagnosis Date Noted  . Wrist fracture 01/16/2017  . Health care maintenance 01/24/2016  . Blood pressure elevated without history of HTN 10/25/2015  . Multiple sclerosis ([place]) 10/02/2015  . Chronic left shoulder pain 07/19/2015  . Multiple sclerosis exacerbation ([place]) 07/14/2015  . MS (multiple sclerosis) ([place]) 11/26/2014  . Increased body mass index 11/26/2014  . HPV test positive 11/26/2014  . Status post laparoscopic supracervical hysterectomy 11/26/2014  . Galactorrhea 11/26/2014  . Tobacco user 11/26/2014  . Back ache 05/21/2014  . Adiposity 05/21/2014  . Disordered sleep 05/21/2014  . Muscle spasticity 05/21/2014  . Calculus of kidney 12/09/2013  . Renal colic 16/02/9603  . Hypercholesteremia 08/19/2013  . Hereditary and idiopathic neuropathy 08/19/2013  . Hypercholesterolemia without hypertriglyceridemia 08/19/2013  . Bladder infection, chronic 07/25/2012  . Disorder of bladder function 07/25/2012  . Incomplete bladder emptying 07/25/2012  . Microscopic hematuria 07/25/2012   Janna Arch, PT, DPT   12/03/2017, 12:53 PM  [place] MAIN [facility] SERVICES [address] [place], [place], [address] Phone: [phone]   Fax:  [phone]  Name:  Kennadee Walthour Walsworth MRN: [id number] Date of Birth: 1961/03/13

## 2017-12-04 ENCOUNTER — Encounter: Payer: PPO | Admitting: Obstetrics and Gynecology

## 2017-12-06 ENCOUNTER — Ambulatory Visit: Payer: PPO | Attending: Neurology

## 2017-12-06 DIAGNOSIS — R2689 Other abnormalities of gait and mobility: Secondary | ICD-10-CM | POA: Diagnosis not present

## 2017-12-06 DIAGNOSIS — M6281 Muscle weakness (generalized): Secondary | ICD-10-CM | POA: Insufficient documentation

## 2017-12-06 DIAGNOSIS — R2681 Unsteadiness on feet: Secondary | ICD-10-CM | POA: Diagnosis not present

## 2017-12-06 NOTE — Therapy (Signed)
[place] MAIN [facility] SERVICES [place], [place], [address] Phone: [phone]   Fax:  [phone]  Physical Therapy Treatment  Patient Details  Name: Lisa Williams MRN: [id number] Date of Birth: 1960/11/15 Referring Provider: Gurney Maxin, MD   Encounter Date: 12/06/2017  PT End of Session - 12/06/17 1100    Visit Number  28    Number of Visits  36    Date for PT Re-Evaluation  12/26/17    Authorization Type  5/10 ( PN start 7/10)    PT Start Time  1105    PT Stop Time  1150    PT Time Calculation (min)  45 min    Activity Tolerance  Patient tolerated treatment well    Behavior During Therapy  [facility] for tasks assessed/performed       Past Medical History:  Diagnosis Date  . Abdominal pain, right upper quadrant   . Back pain   . Calculus of kidney 12/09/2013  . Chronic back pain    unspecified  . Chronic left shoulder pain 07/19/2015  . Functional disorder of bladder    other  . Galactorrhea 11/26/2014   Chronic   . Hereditary and idiopathic neuropathy 08/19/2013  . HPV test positive   . Hypercholesteremia 08/19/2013  . Incomplete bladder emptying   . Microscopic hematuria   . MS (multiple sclerosis) ([place])   . Muscle spasticity 05/21/2014  . Nonspecific findings on examination of urine    other  . Osteopenia   . Status post laparoscopic supracervical hysterectomy 11/26/2014  . Tobacco user 11/26/2014  . Wrist fracture     Past Surgical History:  Procedure Laterality Date  . bilateral tubal ligation  1996  . BREAST CYST EXCISION Left 2002  . KNEE SURGERY     right  . LAPAROSCOPIC SUPRACERVICAL HYSTERECTOMY  08/05/2013  . ORIF WRIST FRACTURE Left 01/17/2017   Procedure: OPEN REDUCTION INTERNAL FIXATION (ORIF) WRIST FRACTURE;  Surgeon: Lovell Sheehan, MD;  Location: [facility] ORS;  Service: Orthopedics;  Laterality: Left;  . TUBAL LIGATION Bilateral   . VAGINAL HYSTERECTOMY  03/2006    There were no vitals  filed for this visit.  Subjective Assessment - 12/06/17 1100    Subjective  Pt states that she is doing well on this date. She denies any neck or thigh pain. Reports improvement with dry needling. She states that she got her car back and drove herself to therapy today. No specific questions or concerns at this time.     Patient is accompained by:  Family member    Pertinent History  Pt is a 57 y/o F who presents with BLE weakness and difficulty ambulating due to h/o MS that was diagnosed in 1995. Pt denies any pain associated. Pt was admitted to the hospital in November 2018 due to MS exacerbation. Nov-Dec to [facility] until end of Jan. After rehab the pt was able to ambulate up to 100 ft with the RW. Pt has Bil AFO, her therapist at [facility] thinks she needs a new AFO as her feet still drag when she ambulates, pt reports HHPT mentioned a spring AFO. Pt has custom AFOs that were originally made 4 years ago. Goals: improve balance, to be able to get into the shower with her tub bench without assist, to be able to get up 2 steps at sister's home with railings on both sides but too far apart to reach both sides, to be able to drive adapted  car. Pt is staying at other sister's house with one step to get inside and currently picking up L leg with UEs and then stepping in. Pt steps down leading with LLE. Pt wants to work on walking side to side as this is challenging and she had not yet progressed to this at rehab. Pt able to get in/out the car with assist only to bring BLEs into the car. Hoping to drive by June or July with adapted car (already has one). Pt needs assist bringing LEs into and out of hospital bed. Pt able to perform sit>stand without assist from [facility] with a pillow on the seat for a boost. Pt denies any falls in the past 6 months. Pt has been increasing her time alone at home up to a few hours at a time to become more independent. Pt has both manual and power WC. Uses power WC at home, uses  manual WC out in community. Pt able to self propel manual WC. Pt needs assist putting pants around ankles but she can pull them up. Pt can put on her shoes but needs help getting her leg up to do so. Pt able to tie her shoelaces on her own. Pt has a tub/shower unit at home with a tub bench. No grab bars in the shower. Has hand held shower head. Pt currently taking a sponge bath. Pt has a regular height toilet with a BSC over top. Pt with h/o L wrist fx with no precautions, pt wore her soft brace to evaluation for comfort but reports her wrist has healed and is doing well following OT.     Limitations  Lifting;Standing;Walking;House hold activities    How long can you stand comfortably?  3 minutes without UE assist, limited by fatigue    How long can you walk comfortably?  100 ft, limited by fatigue    Patient Stated Goals  see above    Currently in Pain?  No/denies          TREATMENT  Ther-ex  Seated:  Balloon pass with PT to challenge patient reach within and outside of BOS for core activation. LE supported 3 minutes x2 trials  Seated knee extensions,2 x 10 each leg, assist  Seated adduction resistance against hand 2 x 10 with 3 second holds BLE; Seated abduction resistance against hand 2 x 10 with 3 second holds BLE; Dynamic reaching with balloon taps, no UE support, initially with feet apart, then feet together, and finally sitting on Airex pad; March 10x each leg BUE support;Improved clearance of R foot with cues for knee to sky 2 sets 2" Airex toe taps x 10 each leg BUE support, cues for bringing knees to ceiling (hip flexion), unable to bring foot on top of step.  Ambulation in // bars 10' x4lengths, Cues to avoid trunk flexion and to initiate step by moving pelvis forward.verbal cues for hip flexion and knee extension; improved step length and decreased foot drag  Balloon taps with SUE support alternating UEs;                      PT Education - 12/06/17  1100    Education provided  Yes    Education Details  exercise form/technique    Person(s) Educated  Patient    Methods  Explanation    Comprehension  Verbalized understanding       PT Short Term Goals - 11/14/17 1339      PT SHORT TERM GOAL #1  Title  Pt will be completed HEP with min assist from caregiver at least 4 days/wk for improved carryover between sessions    Baseline  HEP compliant     Time  2    Period  Weeks    Status  Partially Met      PT SHORT TERM GOAL #2   Title  Patient will report a worst pain of 6/10 on VAS in cervical spine to improve tolerance with ADLs and reduced symptoms with activities.     Baseline  4/30: 9/10 5/21: 0/10 pain    Time  2    Period  Weeks    Status  Achieved      PT SHORT TERM GOAL #3   Title  Patient will perform cervical rotation at least 50 degrees, flexion and extension to 70 degrees, and sidebending to 40 degrees without pain    Baseline  4/30: R 50 L 34 Rotation, R 26 L 35 sb, Extension 35 flex 35 ; 5/21: extension 46, SB R 41 L 40, rotation R 62 L 61,     Time  2    Period  Weeks    Status  Achieved        PT Long Term Goals - 11/14/17 1147      PT LONG TERM GOAL #1   Title  Pt's 5xSTS will impove to equal to or below 2 minutes to demonstrate improved balance and BLE strength    Baseline  3.02 minutes; 5/15: 1 min 30 seconds    Time  6    Period  Weeks    Status  Achieved      PT LONG TERM GOAL #2   Title  Pt's 50mT will improve to at least 0.5 m/s for improved safety ambulating in home    Baseline  0.14 m/s; 5/15: .270m  7/10: .36 m/s     Time  8    Period  Weeks    Status  Partially Met    Target Date  12/26/17      PT LONG TERM GOAL #3   Title  Pt's Bil hip F strength will improve to at least 2+/5 BLEs for improved gait mechanics and safety    Baseline  L: 2/5, R: 1/5 7/10: 2/5 bilaterally     Time  8    Period  Weeks    Status  Partially Met    Target Date  12/26/17      PT LONG TERM GOAL #4   Title   Pt's ABC Scale will improve to at least 40% to demonstrate improved balance    Baseline  28.13% 5/15; 24.4% 7/10: 23.4%     Time  6    Period  Weeks    Status  Partially Met    Target Date  12/26/17      PT LONG TERM GOAL #5   Title  Pt will be able to perform car transfer without assist to work towards pt's goal of returning to driving    Baseline  only needs help with one leg to put onto running board to get into sisters car    Time  6    Period  Weeks    Status  Partially Met      Additional Long Term Goals   Additional Long Term Goals  Yes      PT LONG TERM GOAL #6   Title  Pt will be able to perform tub bench into/out of shower transfer  without assist for improved independence with this task    Baseline  challenged with steps at this time, getting better, stands and gives sponge bath independently now     Time  6    Period  Weeks    Status  Partially Met      PT LONG TERM GOAL #7   Title  Patient will perform cervical rotation at least 60 degrees, flexion and extension to 70 degrees, and sidebending to 40 degrees without pain    Baseline  4/30: R 50 L 34 Rotation, R 26 L 35 sb, Extension 35 flex 35; 5/21: extension 46, SB R 41 L 40, rotation R 62 L 61,     Time  4    Period  Weeks    Status  Achieved      PT LONG TERM GOAL #8   Title  Patient will improve NDI to <28% to reduce disability to mild  percieved disability for improved quality of life.     Baseline  4/30: 56% severe disability 5/21: 10%    Time  4    Period  Weeks    Status  Achieved      PT LONG TERM GOAL  #9   TITLE  Patient will report a worst pain of 3/10 on VAS in  cervical spine  to improve tolerance with ADLs and reduced symptoms with activities.     Baseline  4/30: 9/10 5/21: 0/10 in neck     Time  4    Period  Weeks    Status  Achieved      PT LONG TERM GOAL  #10   TITLE  Pt's 5xSTS will impove to equal to or below 50 seconds to demonstrate improved balance and BLE strength    Baseline  5/15: 1  min 30.9 seconds 7/10: 64 seconds     Time  6    Period  Weeks    Status  Partially Met    Target Date  12/26/17      PT LONG TERM GOAL  #11   TITLE  Patient will descend/ascend down a handicap ramp  with RW and with supervision to increase independence in home environment.     Baseline  7/10 can ascend, unable to descend     Time  6    Period  Weeks    Status  New    Target Date  12/26/17            Plan - 12/06/17 1101    Clinical Impression Statement  Pt demonstrates excellent motivation with therapy on this date. Worked on challenging her sitting and standing dynamic balance as well as seated strength. Pt again practiced ambulation in parallel bars with focus on hip flexion strengthening. Intermittnet rest breaks provided throughout session. Pt encouraged to continue HEP and follow-up as scheduled.     Rehab Potential  Good    PT Frequency  2x / week    PT Duration  8 weeks    PT Treatment/Interventions  ADLs/Self Care Home Management;Aquatic Therapy;Biofeedback;Cryotherapy;Electrical Stimulation;Iontophoresis 2m/ml Dexamethasone;Traction;Moist Heat;Ultrasound;DME Instruction;Gait training;Stair training;Functional mobility training;Therapeutic activities;Therapeutic exercise;Balance training;Neuromuscular re-education;Patient/family education;Orthotic Fit/Training;Wheelchair mobility training;Manual techniques;Compression bandaging;Passive range of motion;Dry needling;Energy conservation;Taping;Splinting    PT Next Visit Plan  assess need for new AFOs or other orthotic to assist with ambulation, initiate strengthening program, practice and improve technique for bed mobility and sit<>stand    PT Home Exercise Plan  to initiate next session (please incorporate hip flexor and adductor stretches  in HEP if possible -KG)    Consulted and Agree with Plan of Care  Patient       Patient will benefit from skilled therapeutic intervention in order to improve the following deficits and  impairments:  Abnormal gait, Decreased activity tolerance, Decreased balance, Decreased coordination, Decreased endurance, Decreased knowledge of use of DME, Decreased mobility, Decreased range of motion, Decreased safety awareness, Decreased strength, Difficulty walking, Impaired perceived functional ability, Impaired sensation, Impaired tone, Impaired UE functional use, Improper body mechanics, Postural dysfunction  Visit Diagnosis: Muscle weakness (generalized)  Unsteadiness on feet     Problem List Patient Active Problem List   Diagnosis Date Noted  . Wrist fracture 01/16/2017  . Health care maintenance 01/24/2016  . Blood pressure elevated without history of HTN 10/25/2015  . Multiple sclerosis ([place]) 10/02/2015  . Chronic left shoulder pain 07/19/2015  . Multiple sclerosis exacerbation ([place]) 07/14/2015  . MS (multiple sclerosis) ([place]) 11/26/2014  . Increased body mass index 11/26/2014  . HPV test positive 11/26/2014  . Status post laparoscopic supracervical hysterectomy 11/26/2014  . Galactorrhea 11/26/2014  . Tobacco user 11/26/2014  . Back ache 05/21/2014  . Adiposity 05/21/2014  . Disordered sleep 05/21/2014  . Muscle spasticity 05/21/2014  . Calculus of kidney 12/09/2013  . Renal colic 83/38/2505  . Hypercholesteremia 08/19/2013  . Hereditary and idiopathic neuropathy 08/19/2013  . Hypercholesterolemia without hypertriglyceridemia 08/19/2013  . Bladder infection, chronic 07/25/2012  . Disorder of bladder function 07/25/2012  . Incomplete bladder emptying 07/25/2012  . Microscopic hematuria 07/25/2012   Phillips Grout PT, DPT, GCS  Waneda Klammer 12/06/2017, 2:44 PM  [place] [facility] [facility] SERVICES [address] [place], [place], [address] Phone: [phone]   Fax:  [phone]  Name: Devon Kingdon [place] MRN: [id number] Date of Birth: [birth year]

## 2017-12-11 ENCOUNTER — Ambulatory Visit: Payer: PPO

## 2017-12-11 DIAGNOSIS — R2681 Unsteadiness on feet: Secondary | ICD-10-CM

## 2017-12-11 DIAGNOSIS — R2689 Other abnormalities of gait and mobility: Secondary | ICD-10-CM

## 2017-12-11 DIAGNOSIS — M6281 Muscle weakness (generalized): Secondary | ICD-10-CM | POA: Diagnosis not present

## 2017-12-11 NOTE — Therapy (Signed)
[place] MAIN [facility] SERVICES [address] [place], [place], [address] Phone: [phone]   Fax:  [phone]  Physical Therapy Treatment  Patient Details  Name: Lisa Williams MRN: [id number] Date of Birth: 1960/08/15 Referring Provider: Gurney Maxin, MD   Encounter Date: 12/11/2017  PT End of Session - 12/11/17 1220    Visit Number  29    Number of Visits  36    Date for PT Re-Evaluation  12/26/17    Authorization Type  6/10 ( PN start 7/10)    PT Start Time  1115    PT Stop Time  1201    PT Time Calculation (min)  46 min    Activity Tolerance  Patient tolerated treatment well    Behavior During Therapy  [facility] for tasks assessed/performed       Past Medical History:  Diagnosis Date  . Abdominal pain, right upper quadrant   . Back pain   . Calculus of kidney 12/09/2013  . Chronic back pain    unspecified  . Chronic left shoulder pain 07/19/2015  . Functional disorder of bladder    other  . Galactorrhea 11/26/2014   Chronic   . Hereditary and idiopathic neuropathy 08/19/2013  . HPV test positive   . Hypercholesteremia 08/19/2013  . Incomplete bladder emptying   . Microscopic hematuria   . MS (multiple sclerosis) ([place])   . Muscle spasticity 05/21/2014  . Nonspecific findings on examination of urine    other  . Osteopenia   . Status post laparoscopic supracervical hysterectomy 11/26/2014  . Tobacco user 11/26/2014  . Wrist fracture     Past Surgical History:  Procedure Laterality Date  . bilateral tubal ligation  1996  . BREAST CYST EXCISION Left 2002  . KNEE SURGERY     right  . LAPAROSCOPIC SUPRACERVICAL HYSTERECTOMY  08/05/2013  . ORIF WRIST FRACTURE Left 01/17/2017   Procedure: OPEN REDUCTION INTERNAL FIXATION (ORIF) WRIST FRACTURE;  Surgeon: Lovell Sheehan, MD;  Location: [facility] ORS;  Service: Orthopedics;  Laterality: Left;  . TUBAL LIGATION Bilateral   . VAGINAL HYSTERECTOMY  03/2006    There were no vitals  filed for this visit.  Subjective Assessment - 12/11/17 1118    Subjective  Patient reports she has been driving ever since her appt last monday. Patient reports she has been feeling well and moving well.     Patient is accompained by:  Family member    Pertinent History  Pt is a 57 y/o F who presents with BLE weakness and difficulty ambulating due to h/o MS that was diagnosed in 1995. Pt denies any pain associated. Pt was admitted to the hospital in November 2018 due to MS exacerbation. Nov-Dec to HHPT-->[facility] until end of Jan. After rehab the pt was able to ambulate up to 100 ft with the RW. Pt has Bil AFO, her therapist at [facility] thinks she needs a new AFO as her feet still drag when she ambulates, pt reports HHPT mentioned a spring AFO. Pt has custom AFOs that were originally made 4 years ago. Goals: improve balance, to be able to get into the shower with her tub bench without assist, to be able to get up 2 steps at sister's home with railings on both sides but too far apart to reach both sides, to be able to drive adapted car. Pt is staying at other sister's house with one step to get inside and currently picking up L leg with UEs  and then stepping in. Pt steps down leading with LLE. Pt wants to work on walking side to side as this is challenging and she had not yet progressed to this at rehab. Pt able to get in/out the car with assist only to bring BLEs into the car. Hoping to drive by June or July with adapted car (already has one). Pt needs assist bringing LEs into and out of hospital bed. Pt able to perform sit>stand without assist from [facility] with a pillow on the seat for a boost. Pt denies any falls in the past 6 months. Pt has been increasing her time alone at home up to a few hours at a time to become more independent. Pt has both manual and power WC. Uses power WC at home, uses manual WC out in community. Pt able to self propel manual WC. Pt needs assist putting pants around ankles but she  can pull them up. Pt can put on her shoes but needs help getting her leg up to do so. Pt able to tie her shoelaces on her own. Pt has a tub/shower unit at home with a tub bench. No grab bars in the shower. Has hand held shower head. Pt currently taking a sponge bath. Pt has a regular height toilet with a BSC over top. Pt with h/o L wrist fx with no precautions, pt wore her soft brace to evaluation for comfort but reports her wrist has healed and is doing well following OT.     Limitations  Lifting;Standing;Walking;House hold activities    How long can you stand comfortably?  3 minutes without UE assist, limited by fatigue    How long can you walk comfortably?  100 ft, limited by fatigue    Patient Stated Goals  see above    Currently in Pain?  No/denies       Ambulation in // bars 10' x4 lengths,  Cues to avoid trunk flexion and to initiate step by moving pelvis forward. verbal cues for hip flexion and knee extension ; improved step length and decreased foot drag    March 10x each leg BUE support;  Improved clearance of R foot with cues for knee to sky  ; 2 sets in a row     2" step toe taps 10x each leg BUE support, cues for bringing knees to ceiling (hip flexion) ; unable to bring foot on top of step.     Sit to stand from elevated seat with UE assist on // bars x5    Standing hip abduction 10x each leg       Seated:  Balloon pass with PT to challenge patient reach within and outside of BOS for core activation. LE supported 3 minutes x2 trials    Seated knee extensions, 12 x each leg, no support needed.    Seated adduction resistance against hand 10x 3 second holds  BLE    Seated abduction resistance against hand 10x 3 second holds    Pt. response to medical necessity: Patient will continue to benefit from skilled PT to improve mobility and independence                         PT Education - 12/11/17 1219    Education provided  Yes    Education Details   exercise technique    Person(s) Educated  Patient    Methods  Explanation;Demonstration;Verbal cues    Comprehension  Verbalized understanding;Returned demonstration  PT Short Term Goals - 11/14/17 1339      PT SHORT TERM GOAL #1   Title  Pt will be completed HEP with min assist from caregiver at least 4 days/wk for improved carryover between sessions    Baseline  HEP compliant     Time  2    Period  Weeks    Status  Partially Met      PT SHORT TERM GOAL #2   Title  Patient will report a worst pain of 6/10 on VAS in cervical spine to improve tolerance with ADLs and reduced symptoms with activities.     Baseline  4/30: 9/10 5/21: 0/10 pain    Time  2    Period  Weeks    Status  Achieved      PT SHORT TERM GOAL #3   Title  Patient will perform cervical rotation at least 50 degrees, flexion and extension to 70 degrees, and sidebending to 40 degrees without pain    Baseline  4/30: R 50 L 34 Rotation, R 26 L 35 sb, Extension 35 flex 35 ; 5/21: extension 46, SB R 41 L 40, rotation R 62 L 61,     Time  2    Period  Weeks    Status  Achieved        PT Long Term Goals - 11/14/17 1147      PT LONG TERM GOAL #1   Title  Pt's 5xSTS will impove to equal to or below 2 minutes to demonstrate improved balance and BLE strength    Baseline  3.02 minutes; 5/15: 1 min 30 seconds    Time  6    Period  Weeks    Status  Achieved      PT LONG TERM GOAL #2   Title  Pt's 45mT will improve to at least 0.5 m/s for improved safety ambulating in home    Baseline  0.14 m/s; 5/15: .250m  7/10: .36 m/s     Time  8    Period  Weeks    Status  Partially Met    Target Date  12/26/17      PT LONG TERM GOAL #3   Title  Pt's Bil hip F strength will improve to at least 2+/5 BLEs for improved gait mechanics and safety    Baseline  L: 2/5, R: 1/5 7/10: 2/5 bilaterally     Time  8    Period  Weeks    Status  Partially Met    Target Date  12/26/17      PT LONG TERM GOAL #4   Title  Pt's ABC  Scale will improve to at least 40% to demonstrate improved balance    Baseline  28.13% 5/15; 24.4% 7/10: 23.4%     Time  6    Period  Weeks    Status  Partially Met    Target Date  12/26/17      PT LONG TERM GOAL #5   Title  Pt will be able to perform car transfer without assist to work towards pt's goal of returning to driving    Baseline  only needs help with one leg to put onto running board to get into sisters car    Time  6    Period  Weeks    Status  Partially Met      Additional Long Term Goals   Additional Long Term Goals  Yes      PT LONG  TERM GOAL #6   Title  Pt will be able to perform tub bench into/out of shower transfer without assist for improved independence with this task    Baseline  challenged with steps at this time, getting better, stands and gives sponge bath independently now     Time  6    Period  Weeks    Status  Partially Met      PT LONG TERM GOAL #7   Title  Patient will perform cervical rotation at least 60 degrees, flexion and extension to 70 degrees, and sidebending to 40 degrees without pain    Baseline  4/30: R 50 L 34 Rotation, R 26 L 35 sb, Extension 35 flex 35; 5/21: extension 46, SB R 41 L 40, rotation R 62 L 61,     Time  4    Period  Weeks    Status  Achieved      PT LONG TERM GOAL #8   Title  Patient will improve NDI to <28% to reduce disability to mild  percieved disability for improved quality of life.     Baseline  4/30: 56% severe disability 5/21: 10%    Time  4    Period  Weeks    Status  Achieved      PT LONG TERM GOAL  #9   TITLE  Patient will report a worst pain of 3/10 on VAS in  cervical spine  to improve tolerance with ADLs and reduced symptoms with activities.     Baseline  4/30: 9/10 5/21: 0/10 in neck     Time  4    Period  Weeks    Status  Achieved      PT LONG TERM GOAL  #10   TITLE  Pt's 5xSTS will impove to equal to or below 50 seconds to demonstrate improved balance and BLE strength    Baseline  5/15: 1 min 30.9  seconds 7/10: 64 seconds     Time  6    Period  Weeks    Status  Partially Met    Target Date  12/26/17      PT LONG TERM GOAL  #11   TITLE  Patient will descend/ascend down a handicap ramp  with RW and with supervision to increase independence in home environment.     Baseline  7/10 can ascend, unable to descend     Time  6    Period  Weeks    Status  New    Target Date  12/26/17            Plan - 12/11/17 1223    Clinical Impression Statement  Patient progressing with increased hip flexion resulting in good clearance of feet with ambulation.  Right knee extension is limited due to fatigue resulting in poor excursions. Patient will continue to benefit from skilled PT to improve mobility and independence     Rehab Potential  Good    PT Frequency  2x / week    PT Duration  8 weeks    PT Treatment/Interventions  ADLs/Self Care Home Management;Aquatic Therapy;Biofeedback;Cryotherapy;Electrical Stimulation;Iontophoresis 39m/ml Dexamethasone;Traction;Moist Heat;Ultrasound;DME Instruction;Gait training;Stair training;Functional mobility training;Therapeutic activities;Therapeutic exercise;Balance training;Neuromuscular re-education;Patient/family education;Orthotic Fit/Training;Wheelchair mobility training;Manual techniques;Compression bandaging;Passive range of motion;Dry needling;Energy conservation;Taping;Splinting    PT Next Visit Plan  assess need for new AFOs or other orthotic to assist with ambulation, initiate strengthening program, practice and improve technique for bed mobility and sit<>stand    PT Home Exercise Plan  to initiate next  session (please incorporate hip flexor and adductor stretches in HEP if possible -KG)    Consulted and Agree with Plan of Care  Patient       Patient will benefit from skilled therapeutic intervention in order to improve the following deficits and impairments:  Abnormal gait, Decreased activity tolerance, Decreased balance, Decreased coordination,  Decreased endurance, Decreased knowledge of use of DME, Decreased mobility, Decreased range of motion, Decreased safety awareness, Decreased strength, Difficulty walking, Impaired perceived functional ability, Impaired sensation, Impaired tone, Impaired UE functional use, Improper body mechanics, Postural dysfunction  Visit Diagnosis: Muscle weakness (generalized)  Unsteadiness on feet  Other abnormalities of gait and mobility     Problem List Patient Active Problem List   Diagnosis Date Noted  . Wrist fracture 01/16/2017  . Health care maintenance 01/24/2016  . Blood pressure elevated without history of HTN 10/25/2015  . Multiple sclerosis ([place]) 10/02/2015  . Chronic left shoulder pain 07/19/2015  . Multiple sclerosis exacerbation ([place]) 07/14/2015  . MS (multiple sclerosis) ([place]) 11/26/2014  . Increased body mass index 11/26/2014  . HPV test positive 11/26/2014  . Status post laparoscopic supracervical hysterectomy 11/26/2014  . Galactorrhea 11/26/2014  . Tobacco user 11/26/2014  . Back ache 05/21/2014  . Adiposity 05/21/2014  . Disordered sleep 05/21/2014  . Muscle spasticity 05/21/2014  . Calculus of kidney 12/09/2013  . Renal colic 15/72/6203  . Hypercholesteremia 08/19/2013  . Hereditary and idiopathic neuropathy 08/19/2013  . Hypercholesterolemia without hypertriglyceridemia 08/19/2013  . Bladder infection, chronic 07/25/2012  . Disorder of bladder function 07/25/2012  . Incomplete bladder emptying 07/25/2012  . Microscopic hematuria 07/25/2012   Janna Arch, PT, DPT   12/11/2017, 12:24 PM  [place] MAIN [facility] SERVICES [address] [place], [place], [address] Phone: [phone]   Fax:  [phone]  Name: Ellenore Roscoe Tenaglia MRN: [id number] Date of Birth: 03-31-1961

## 2017-12-12 DIAGNOSIS — G35 Multiple sclerosis: Secondary | ICD-10-CM | POA: Diagnosis not present

## 2017-12-13 ENCOUNTER — Ambulatory Visit: Payer: PPO

## 2017-12-13 DIAGNOSIS — M6281 Muscle weakness (generalized): Secondary | ICD-10-CM

## 2017-12-13 DIAGNOSIS — R2689 Other abnormalities of gait and mobility: Secondary | ICD-10-CM

## 2017-12-13 DIAGNOSIS — R2681 Unsteadiness on feet: Secondary | ICD-10-CM

## 2017-12-13 NOTE — Therapy (Signed)
[facility] [facility] MAIN [facility] SERVICES [address] [place], [place], [address] Phone: [phone]   Fax:  [phone]  Physical Therapy Treatment  Patient Details  Name: Lisa Williams MRN: [id number] Date of Birth: Jun 22, 1960 Referring Provider: Theora Master, MD   Encounter Date: 12/13/2017  PT End of Session - 12/13/17 1108    Visit Number  30    Number of Visits  36    Date for PT Re-Evaluation  12/26/17    Authorization Type  7/10 ( PN start 7/10)    PT Start Time  1114    PT Stop Time  1200    PT Time Calculation (min)  46 min    Activity Tolerance  Patient tolerated treatment well    Behavior During Therapy  [facility] for tasks assessed/performed       Past Medical History:  Diagnosis Date  . Abdominal pain, right upper quadrant   . Back pain   . Calculus of kidney 12/09/2013  . Chronic back pain    unspecified  . Chronic left shoulder pain 07/19/2015  . Functional disorder of bladder    other  . Galactorrhea 11/26/2014   Chronic   . Hereditary and idiopathic neuropathy 08/19/2013  . HPV test positive   . Hypercholesteremia 08/19/2013  . Incomplete bladder emptying   . Microscopic hematuria   . MS (multiple sclerosis) ([facility])   . Muscle spasticity 05/21/2014  . Nonspecific findings on examination of urine    other  . Osteopenia   . Status post laparoscopic supracervical hysterectomy 11/26/2014  . Tobacco user 11/26/2014  . Wrist fracture     Past Surgical History:  Procedure Laterality Date  . bilateral tubal ligation  1996  . BREAST CYST EXCISION Left 2002  . KNEE SURGERY     right  . LAPAROSCOPIC SUPRACERVICAL HYSTERECTOMY  08/05/2013  . ORIF WRIST FRACTURE Left 01/17/2017   Procedure: OPEN REDUCTION INTERNAL FIXATION (ORIF) WRIST FRACTURE;  Surgeon: Lyndle Herrlich, MD;  Location: [facility] ORS;  Service: Orthopedics;  Laterality: Left;  . TUBAL LIGATION Bilateral   . VAGINAL HYSTERECTOMY  03/2006    There were no vitals  filed for this visit.  Subjective Assessment - 12/13/17 1145    Subjective  Patient reports no pain. Has been enjoying driving. No falls no LOB. HEP compliant. Walked in hallway. Feels a little better.     Patient is accompained by:  Family member    Pertinent History  Pt is a 57 y/o F who presents with BLE weakness and difficulty ambulating due to h/o MS that was diagnosed in 1995. Pt denies any pain associated. Pt was admitted to the hospital in November 2018 due to MS exacerbation. [facility] until end of [facility]. After rehab the pt was able to ambulate up to 100 ft with the RW. Pt has Bil AFO, her therapist [facility] thinks she needs a new AFO as her feet still drag when she ambulates, pt reports HHPT mentioned a spring AFO. Pt has custom AFOs that were originally made 4 years ago. Goals: improve balance, to be able to get into the shower with her tub bench without assist, to be able to get up 2 steps at sister's home with railings on both sides but too far apart to reach both sides, to be able to drive adapted car. Pt is staying at other sister's house with one step to get inside and currently picking up L leg with UEs and  then stepping in. Pt steps down leading with LLE. Pt wants to work on walking side to side as this is challenging and she had not yet progressed to this at rehab. Pt able to get in/out the car with assist only to bring BLEs into the car. Hoping to drive by June or July with adapted car (already has one). Pt needs assist bringing LEs into and out of hospital bed. Pt able to perform sit>stand without assist from [facility] with a pillow on the seat for a boost. Pt denies any falls in the past 6 months. Pt has been increasing her time alone at home up to a few hours at a time to become more independent. Pt has both manual and power WC. Uses power WC at home, uses manual WC out in community. Pt able to self propel manual WC. Pt needs assist putting pants around ankles but she  can pull them up. Pt can put on her shoes but needs help getting her leg up to do so. Pt able to tie her shoelaces on her own. Pt has a tub/shower unit at home with a tub bench. No grab bars in the shower. Has hand held shower head. Pt currently taking a sponge bath. Pt has a regular height toilet with a BSC over top. Pt with h/o L wrist fx with no precautions, pt wore her soft brace to evaluation for comfort but reports her wrist has healed and is doing well following OT.     Limitations  Lifting;Standing;Walking;House hold activities    How long can you stand comfortably?  3 minutes without UE assist, limited by fatigue    How long can you walk comfortably?  100 ft, limited by fatigue    Patient Stated Goals  see above    Currently in Pain?  No/denies      5x STS from raised plinth surface. ; cues for keeping knees apart and utilizing legs over UE. One hand on walker and one on plinth.   Seated:  LAQ : 10x each leg. unweighted no UE support ; 2 sets ER step out 10x each leg, LLE easier than RLE; 2 sets   Balloon taps: seated: reaching inside and outside BOS, 2 sets   Weighted ball (2000 Gr) straight arm raises 10x; 2 sets  Weighted ball (2000 Gr) horizontal twists.   Seated Hip flexion 10x each leg to touch PT hand. Unable to clear leg.                          PT Education - 12/13/17 1107    Education provided  Yes    Education Details  exercise technique    Person(s) Educated  Patient    Methods  Explanation;Demonstration;Verbal cues    Comprehension  Verbalized understanding;Returned demonstration                 Plan - 12/13/17 1201    Clinical Impression Statement  Patient utilizes LE's for sit to stand transfers with increased stability and better placement resulting in smoother and safer transitions. Patient having continued difficulty with RLE muscle recruitment in comparison to LLE. Patient will continue to benefit from skilled PT to  improve mobility and independence     Rehab Potential  Good    PT Frequency  2x / week    PT Duration  8 weeks    PT Treatment/Interventions  ADLs/Self Care Home Management;Aquatic Therapy;Biofeedback;Cryotherapy;Electrical Stimulation;Iontophoresis 4mg /ml Dexamethasone;Traction;Moist Heat;Ultrasound;DME Instruction;Gait training;Stair training;Functional  mobility training;Therapeutic activities;Therapeutic exercise;Balance training;Neuromuscular re-education;Patient/family education;Orthotic Fit/Training;Wheelchair mobility training;Manual techniques;Compression bandaging;Passive range of motion;Dry needling;Energy conservation;Taping;Splinting    PT Next Visit Plan  assess need for new AFOs or other orthotic to assist with ambulation, initiate strengthening program, practice and improve technique for bed mobility and sit<>stand    PT Home Exercise Plan  to initiate next session (please incorporate hip flexor and adductor stretches in HEP if possible -KG)    Consulted and Agree with Plan of Care  Patient       Patient will benefit from skilled therapeutic intervention in order to improve the following deficits and impairments:  Abnormal gait, Decreased activity tolerance, Decreased balance, Decreased coordination, Decreased endurance, Decreased knowledge of use of DME, Decreased mobility, Decreased range of motion, Decreased safety awareness, Decreased strength, Difficulty walking, Impaired perceived functional ability, Impaired sensation, Impaired tone, Impaired UE functional use, Improper body mechanics, Postural dysfunction  Visit Diagnosis: Muscle weakness (generalized)  Unsteadiness on feet  Other abnormalities of gait and mobility     Problem List Patient Active Problem List   Diagnosis Date Noted  . Wrist fracture 01/16/2017  . Health care maintenance 01/24/2016  . Blood pressure elevated without history of HTN 10/25/2015  . Multiple sclerosis ([facility]) 10/02/2015  . Chronic left  shoulder pain 07/19/2015  . Multiple sclerosis exacerbation ([facility]) 07/14/2015  . MS (multiple sclerosis) ([facility]) 11/26/2014  . Increased body mass index 11/26/2014  . HPV test positive 11/26/2014  . Status post laparoscopic supracervical hysterectomy 11/26/2014  . Galactorrhea 11/26/2014  . Tobacco user 11/26/2014  . Back ache 05/21/2014  . Adiposity 05/21/2014  . Disordered sleep 05/21/2014  . Muscle spasticity 05/21/2014  . Calculus of kidney 12/09/2013  . Renal colic 12/09/2013  . Hypercholesteremia 08/19/2013  . Hereditary and idiopathic neuropathy 08/19/2013  . Hypercholesterolemia without hypertriglyceridemia 08/19/2013  . Bladder infection, chronic 07/25/2012  . Disorder of bladder function 07/25/2012  . Incomplete bladder emptying 07/25/2012  . Microscopic hematuria 07/25/2012   Precious Bard, PT, DPT   12/13/2017, 12:02 PM  [facility] MAIN [facility] SERVICES [address] [place], [place], [address] Phone: [phone]   Fax:  [phone]  Name: Lisa Williams MRN: [id number] Date of Birth: 16-Mar-1961

## 2017-12-18 ENCOUNTER — Ambulatory Visit: Payer: PPO

## 2017-12-18 DIAGNOSIS — R2681 Unsteadiness on feet: Secondary | ICD-10-CM

## 2017-12-18 DIAGNOSIS — R2689 Other abnormalities of gait and mobility: Secondary | ICD-10-CM

## 2017-12-18 DIAGNOSIS — M6281 Muscle weakness (generalized): Secondary | ICD-10-CM | POA: Diagnosis not present

## 2017-12-18 NOTE — Therapy (Signed)
[place] MAIN [facility] SERVICES [address] [place], [place], [address] Phone: [phone]   Fax:  [phone]  Physical Therapy Treatment  Patient Details  Name: Lisa Williams MRN: [id number] Date of Birth: 15-Jan-1961 Referring Provider: Gurney Maxin, MD   Encounter Date: 12/18/2017  PT End of Session - 12/18/17 1205    Visit Number  31    Number of Visits  36    Date for PT Re-Evaluation  12/26/17    Authorization Type  8/10 ( PN start 7/10)    PT Start Time  1114    PT Stop Time  1201    PT Time Calculation (min)  47 min    Equipment Utilized During Treatment  Gait belt    Activity Tolerance  Patient tolerated treatment well    Behavior During Therapy  WFL for tasks assessed/performed       Past Medical History:  Diagnosis Date  . Abdominal pain, right upper quadrant   . Back pain   . Calculus of kidney 12/09/2013  . Chronic back pain    unspecified  . Chronic left shoulder pain 07/19/2015  . Functional disorder of bladder    other  . Galactorrhea 11/26/2014   Chronic   . Hereditary and idiopathic neuropathy 08/19/2013  . HPV test positive   . Hypercholesteremia 08/19/2013  . Incomplete bladder emptying   . Microscopic hematuria   . MS (multiple sclerosis) ([place])   . Muscle spasticity 05/21/2014  . Nonspecific findings on examination of urine    other  . Osteopenia   . Status post laparoscopic supracervical hysterectomy 11/26/2014  . Tobacco user 11/26/2014  . Wrist fracture     Past Surgical History:  Procedure Laterality Date  . bilateral tubal ligation  1996  . BREAST CYST EXCISION Left 2002  . KNEE SURGERY     right  . LAPAROSCOPIC SUPRACERVICAL HYSTERECTOMY  08/05/2013  . ORIF WRIST FRACTURE Left 01/17/2017   Procedure: OPEN REDUCTION INTERNAL FIXATION (ORIF) WRIST FRACTURE;  Surgeon: Lovell Sheehan, MD;  Location: [facility] ORS;  Service: Orthopedics;  Laterality: Left;  . TUBAL LIGATION Bilateral   . VAGINAL  HYSTERECTOMY  03/2006    There were no vitals filed for this visit.  Subjective Assessment - 12/18/17 1134    Subjective  Patient reports having a relaxing weekend. Has brought walker for practicing decline. Has been compliant with HEP.     Patient is accompained by:  Family member    Pertinent History  Pt is a 57 y/o F who presents with BLE weakness and difficulty ambulating due to h/o MS that was diagnosed in 1995. Pt denies any pain associated. Pt was admitted to the hospital in November 2018 due to MS exacerbation. Nov-Dec to HHPT-->[facility] until end of Jan. After rehab the pt was able to ambulate up to 100 ft with the RW. Pt has Bil AFO, her therapist at [facility] thinks she needs a new AFO as her feet still drag when she ambulates, pt reports HHPT mentioned a spring AFO. Pt has custom AFOs that were originally made 4 years ago. Goals: improve balance, to be able to get into the shower with her tub bench without assist, to be able to get up 2 steps at sister's home with railings on both sides but too far apart to reach both sides, to be able to drive adapted car. Pt is staying at other sister's house with one step to get inside and currently picking  up L leg with UEs and then stepping in. Pt steps down leading with LLE. Pt wants to work on walking side to side as this is challenging and she had not yet progressed to this at rehab. Pt able to get in/out the car with assist only to bring BLEs into the car. Hoping to drive by June or July with adapted car (already has one). Pt needs assist bringing LEs into and out of hospital bed. Pt able to perform sit>stand without assist from [facility] with a pillow on the seat for a boost. Pt denies any falls in the past 6 months. Pt has been increasing her time alone at home up to a few hours at a time to become more independent. Pt has both manual and power WC. Uses power WC at home, uses manual WC out in community. Pt able to self propel manual WC. Pt needs assist  putting pants around ankles but she can pull them up. Pt can put on her shoes but needs help getting her leg up to do so. Pt able to tie her shoelaces on her own. Pt has a tub/shower unit at home with a tub bench. No grab bars in the shower. Has hand held shower head. Pt currently taking a sponge bath. Pt has a regular height toilet with a BSC over top. Pt with h/o L wrist fx with no precautions, pt wore her soft brace to evaluation for comfort but reports her wrist has healed and is doing well following OT.     Limitations  Lifting;Standing;Walking;House hold activities    How long can you stand comfortably?  3 minutes without UE assist, limited by fatigue    How long can you walk comfortably?  100 ft, limited by fatigue    Patient Stated Goals  see above    Currently in Pain?  No/denies      Ambulate down decline ramp (ADA standard) with RW and CGA; 25 ft; good step length    Ambulate 10 ft around 2 cones and back. Cues for keeping feet within walker.     LAQ : 10x each leg. Kicking soccer ball  ER step out 10x each leg, LLE easier than RLE; performed in RW with Mod A for lifting leg against gravity   Balloon taps: seated: reaching inside and outside BOS, 2 sets    Gluteal squeezes 10x 5 seconds  TrA activation 10x 3 second holds                    PT Education - 12/18/17 1204    Education provided  Yes    Education Details  exercise technique     Person(s) Educated  Patient    Methods  Explanation;Demonstration;Verbal cues    Comprehension  Verbalized understanding;Returned demonstration       PT Short Term Goals - 11/14/17 1339      PT SHORT TERM GOAL #1   Title  Pt will be completed HEP with min assist from caregiver at least 4 days/wk for improved carryover between sessions    Baseline  HEP compliant     Time  2    Period  Weeks    Status  Partially Met      PT SHORT TERM GOAL #2   Title  Patient will report a worst pain of 6/10 on VAS in cervical  spine to improve tolerance with ADLs and reduced symptoms with activities.     Baseline  4/30: 9/10 5/21: 0/10 pain  Time  2    Period  Weeks    Status  Achieved      PT SHORT TERM GOAL #3   Title  Patient will perform cervical rotation at least 50 degrees, flexion and extension to 70 degrees, and sidebending to 40 degrees without pain    Baseline  4/30: R 50 L 34 Rotation, R 26 L 35 sb, Extension 35 flex 35 ; 5/21: extension 46, SB R 41 L 40, rotation R 62 L 61,     Time  2    Period  Weeks    Status  Achieved        PT Long Term Goals - 11/14/17 1147      PT LONG TERM GOAL #1   Title  Pt's 5xSTS will impove to equal to or below 2 minutes to demonstrate improved balance and BLE strength    Baseline  3.02 minutes; 5/15: 1 min 30 seconds    Time  6    Period  Weeks    Status  Achieved      PT LONG TERM GOAL #2   Title  Pt's 14mT will improve to at least 0.5 m/s for improved safety ambulating in home    Baseline  0.14 m/s; 5/15: .256m  7/10: .36 m/s     Time  8    Period  Weeks    Status  Partially Met    Target Date  12/26/17      PT LONG TERM GOAL #3   Title  Pt's Bil hip F strength will improve to at least 2+/5 BLEs for improved gait mechanics and safety    Baseline  L: 2/5, R: 1/5 7/10: 2/5 bilaterally     Time  8    Period  Weeks    Status  Partially Met    Target Date  12/26/17      PT LONG TERM GOAL #4   Title  Pt's ABC Scale will improve to at least 40% to demonstrate improved balance    Baseline  28.13% 5/15; 24.4% 7/10: 23.4%     Time  6    Period  Weeks    Status  Partially Met    Target Date  12/26/17      PT LONG TERM GOAL #5   Title  Pt will be able to perform car transfer without assist to work towards pt's goal of returning to driving    Baseline  only needs help with one leg to put onto running board to get into sisters car    Time  6    Period  Weeks    Status  Partially Met      Additional Long Term Goals   Additional Long Term Goals  Yes       PT LONG TERM GOAL #6   Title  Pt will be able to perform tub bench into/out of shower transfer without assist for improved independence with this task    Baseline  challenged with steps at this time, getting better, stands and gives sponge bath independently now     Time  6    Period  Weeks    Status  Partially Met      PT LONG TERM GOAL #7   Title  Patient will perform cervical rotation at least 60 degrees, flexion and extension to 70 degrees, and sidebending to 40 degrees without pain    Baseline  4/30: R 50 L 34 Rotation, R 26 L 35  sb, Extension 35 flex 35; 5/21: extension 46, SB R 41 L 40, rotation R 62 L 61,     Time  4    Period  Weeks    Status  Achieved      PT LONG TERM GOAL #8   Title  Patient will improve NDI to <28% to reduce disability to mild  percieved disability for improved quality of life.     Baseline  4/30: 56% severe disability 5/21: 10%    Time  4    Period  Weeks    Status  Achieved      PT LONG TERM GOAL  #9   TITLE  Patient will report a worst pain of 3/10 on VAS in  cervical spine  to improve tolerance with ADLs and reduced symptoms with activities.     Baseline  4/30: 9/10 5/21: 0/10 in neck     Time  4    Period  Weeks    Status  Achieved      PT LONG TERM GOAL  #10   TITLE  Pt's 5xSTS will impove to equal to or below 50 seconds to demonstrate improved balance and BLE strength    Baseline  5/15: 1 min 30.9 seconds 7/10: 64 seconds     Time  6    Period  Weeks    Status  Partially Met    Target Date  12/26/17      PT LONG TERM GOAL  #11   TITLE  Patient will descend/ascend down a handicap ramp  with RW and with supervision to increase independence in home environment.     Baseline  7/10 can ascend, unable to descend     Time  6    Period  Weeks    Status  New    Target Date  12/26/17            Plan - 12/18/17 1206    Clinical Impression Statement  Patient demonstrated ability to negotiate decline ramp of ADA standard height with RW  and CGA with no foot drag. Ability to ambulate negotiating cones with RW and and CGA demonstrated with need for cueing for  Keeping feet within walker. Patient will continue to benefit from skilled PT to improve mobility and independence     Rehab Potential  Good    PT Frequency  2x / week    PT Duration  8 weeks    PT Treatment/Interventions  ADLs/Self Care Home Management;Aquatic Therapy;Biofeedback;Cryotherapy;Electrical Stimulation;Iontophoresis 6m/ml Dexamethasone;Traction;Moist Heat;Ultrasound;DME Instruction;Gait training;Stair training;Functional mobility training;Therapeutic activities;Therapeutic exercise;Balance training;Neuromuscular re-education;Patient/family education;Orthotic Fit/Training;Wheelchair mobility training;Manual techniques;Compression bandaging;Passive range of motion;Dry needling;Energy conservation;Taping;Splinting    PT Next Visit Plan  assess need for new AFOs or other orthotic to assist with ambulation, initiate strengthening program, practice and improve technique for bed mobility and sit<>stand    PT Home Exercise Plan  to initiate next session (please incorporate hip flexor and adductor stretches in HEP if possible -KG)    Consulted and Agree with Plan of Care  Patient       Patient will benefit from skilled therapeutic intervention in order to improve the following deficits and impairments:  Abnormal gait, Decreased activity tolerance, Decreased balance, Decreased coordination, Decreased endurance, Decreased knowledge of use of DME, Decreased mobility, Decreased range of motion, Decreased safety awareness, Decreased strength, Difficulty walking, Impaired perceived functional ability, Impaired sensation, Impaired tone, Impaired UE functional use, Improper body mechanics, Postural dysfunction  Visit Diagnosis: Muscle weakness (generalized)  Unsteadiness on feet  Other abnormalities of gait and mobility     Problem List Patient Active Problem List    Diagnosis Date Noted  . Wrist fracture 01/16/2017  . Health care maintenance 01/24/2016  . Blood pressure elevated without history of HTN 10/25/2015  . Multiple sclerosis ([place]) 10/02/2015  . Chronic left shoulder pain 07/19/2015  . Multiple sclerosis exacerbation ([place]) 07/14/2015  . MS (multiple sclerosis) ([place]) 11/26/2014  . Increased body mass index 11/26/2014  . HPV test positive 11/26/2014  . Status post laparoscopic supracervical hysterectomy 11/26/2014  . Galactorrhea 11/26/2014  . Tobacco user 11/26/2014  . Back ache 05/21/2014  . Adiposity 05/21/2014  . Disordered sleep 05/21/2014  . Muscle spasticity 05/21/2014  . Calculus of kidney 12/09/2013  . Renal colic 47/04/5270  . Hypercholesteremia 08/19/2013  . Hereditary and idiopathic neuropathy 08/19/2013  . Hypercholesterolemia without hypertriglyceridemia 08/19/2013  . Bladder infection, chronic 07/25/2012  . Disorder of bladder function 07/25/2012  . Incomplete bladder emptying 07/25/2012  . Microscopic hematuria 07/25/2012   Janna Arch, PT, DPT   12/18/2017, 12:07 PM  [place] MAIN [facility] SERVICES [address] [place], [place], [address] Phone: [phone]   Fax:  [phone]  Name: Lisa Williams MRN: [id number] Date of Birth: July 24, 1960

## 2017-12-20 ENCOUNTER — Ambulatory Visit: Payer: PPO

## 2017-12-20 DIAGNOSIS — M6281 Muscle weakness (generalized): Secondary | ICD-10-CM

## 2017-12-20 DIAGNOSIS — R2689 Other abnormalities of gait and mobility: Secondary | ICD-10-CM

## 2017-12-20 DIAGNOSIS — R2681 Unsteadiness on feet: Secondary | ICD-10-CM

## 2017-12-20 NOTE — Therapy (Signed)
[place] MAIN [facility] SERVICES [address] [place], [place], [address] Phone: [phone]   Fax:  [phone]  Physical Therapy Treatment  Patient Details  Name: Lisa Williams MRN: [id number] Date of Birth: [birth year] Referring Provider: Gurney Maxin, MD   Encounter Date: 12/20/2017  PT End of Session - 12/20/17 1249    Visit Number  32    Number of Visits  36    Date for PT Re-Evaluation  12/26/17    Authorization Type  9/10 ( PN start 7/10)    PT Start Time  1115    PT Stop Time  1200    PT Time Calculation (min)  45 min    Equipment Utilized During Treatment  Gait belt    Activity Tolerance  Patient tolerated treatment well    Behavior During Therapy  [facility] for tasks assessed/performed       Past Medical History:  Diagnosis Date  . Abdominal pain, right upper quadrant   . Back pain   . Calculus of kidney 12/09/2013  . Chronic back pain    unspecified  . Chronic left shoulder pain 07/19/2015  . Functional disorder of bladder    other  . Galactorrhea 11/26/2014   Chronic   . Hereditary and idiopathic neuropathy 08/19/2013  . HPV test positive   . Hypercholesteremia 08/19/2013  . Incomplete bladder emptying   . Microscopic hematuria   . MS (multiple sclerosis) ([place])   . Muscle spasticity 05/21/2014  . Nonspecific findings on examination of urine    other  . Osteopenia   . Status post laparoscopic supracervical hysterectomy 11/26/2014  . Tobacco user 11/26/2014  . Wrist fracture     Past Surgical History:  Procedure Laterality Date  . bilateral tubal ligation  1996  . BREAST CYST EXCISION Left 2002  . KNEE SURGERY     right  . LAPAROSCOPIC SUPRACERVICAL HYSTERECTOMY  08/05/2013  . ORIF WRIST FRACTURE Left 01/17/2017   Procedure: OPEN REDUCTION INTERNAL FIXATION (ORIF) WRIST FRACTURE;  Surgeon: Lovell Sheehan, MD;  Location: [facility] ORS;  Service: Orthopedics;  Laterality: Left;  . TUBAL LIGATION Bilateral   . VAGINAL  HYSTERECTOMY  03/2006    There were no vitals filed for this visit.  Subjective Assessment - 12/20/17 1147    Subjective  Patient reports she had a near fall with her R leg getting stuck when going out of door into garage. Was able to maintain balance.   (Pended)     Patient is accompained by:  Family member  (Pended)     Pertinent History  Pt is a 57 y/o F who presents with BLE weakness and difficulty ambulating due to h/o MS that was diagnosed in 1995. Pt denies any pain associated. Pt was admitted to the hospital in November 2018 due to MS exacerbation. Nov-Dec to HHPT-->[facility] until end of Jan. After rehab the pt was able to ambulate up to 100 ft with the RW. Pt has Bil AFO, her therapist at [place] thinks she needs a new AFO as her feet still drag when she ambulates, pt reports HHPT mentioned a spring AFO. Pt has custom AFOs that were originally made 4 years ago. Goals: improve balance, to be able to get into the shower with her tub bench without assist, to be able to get up 2 steps at sister's home with railings on both sides but too far apart to reach both sides, to be able to drive adapted car. Pt is  staying at other sister's house with one step to get inside and currently picking up L leg with UEs and then stepping in. Pt steps down leading with LLE. Pt wants to work on walking side to side as this is challenging and she had not yet progressed to this at rehab. Pt able to get in/out the car with assist only to bring BLEs into the car. Hoping to drive by June or July with adapted car (already has one). Pt needs assist bringing LEs into and out of hospital bed. Pt able to perform sit>stand without assist from [facility] with a pillow on the seat for a boost. Pt denies any falls in the past 6 months. Pt has been increasing her time alone at home up to a few hours at a time to become more independent. Pt has both manual and power WC. Uses power WC at home, uses manual WC out in community. Pt able to  self propel manual WC. Pt needs assist putting pants around ankles but she can pull them up. Pt can put on her shoes but needs help getting her leg up to do so. Pt able to tie her shoelaces on her own. Pt has a tub/shower unit at home with a tub bench. No grab bars in the shower. Has hand held shower head. Pt currently taking a sponge bath. Pt has a regular height toilet with a BSC over top. Pt with h/o L wrist fx with no precautions, pt wore her soft brace to evaluation for comfort but reports her wrist has healed and is doing well following OT.   (Pended)     Limitations  Lifting;Standing;Walking;House hold activities  (Pended)     How long can you stand comfortably?  3 minutes without UE assist, limited by fatigue  (Pended)     How long can you walk comfortably?  100 ft, limited by fatigue  (Pended)     Patient Stated Goals  see above  (Pended)        5x STS from raised plinth surface. ; cues for keeping knees apart and utilizing legs over UE. One hand on walker and one on plinth.; 2 sets: varying heights     Seated:  LAQ : 10x each leg. unweighted no UE support ; 2 sets ER step out 10x each leg, LLE easier than RLE; 2 sets   Adduction manual resistance 10x each leg 4 second holds  Seated IR feet 10x each leg    Balloon taps: seated: reaching inside and outside BOS, 2 sets    Weighted ball (2000 Gr) straight arm raises 10x;   Weighted ball (2000 Gr) horizontal twists; 10x each direction.   Weighted ball (2000 Gr) seated ball pass   Seated Hip flexion 10x each leg to touch PT hand. Unable to clear leg                        PT Education - 12/20/17 1249    Education provided  Yes    Education Details  exercise technique     Person(s) Educated  Patient    Methods  Explanation;Demonstration;Verbal cues    Comprehension  Verbalized understanding;Returned demonstration       PT Short Term Goals - 11/14/17 1339      PT SHORT TERM GOAL #1   Title  Pt will be  completed HEP with min assist from caregiver at least 4 days/wk for improved carryover between sessions    Baseline  HEP compliant     Time  2    Period  Weeks    Status  Partially Met      PT SHORT TERM GOAL #2   Title  Patient will report a worst pain of 6/10 on VAS in cervical spine to improve tolerance with ADLs and reduced symptoms with activities.     Baseline  4/30: 9/10 5/21: 0/10 pain    Time  2    Period  Weeks    Status  Achieved      PT SHORT TERM GOAL #3   Title  Patient will perform cervical rotation at least 50 degrees, flexion and extension to 70 degrees, and sidebending to 40 degrees without pain    Baseline  4/30: R 50 L 34 Rotation, R 26 L 35 sb, Extension 35 flex 35 ; 5/21: extension 46, SB R 41 L 40, rotation R 62 L 61,     Time  2    Period  Weeks    Status  Achieved        PT Long Term Goals - 11/14/17 1147      PT LONG TERM GOAL #1   Title  Pt's 5xSTS will impove to equal to or below 2 minutes to demonstrate improved balance and BLE strength    Baseline  3.02 minutes; 5/15: 1 min 30 seconds    Time  6    Period  Weeks    Status  Achieved      PT LONG TERM GOAL #2   Title  Pt's 51mT will improve to at least 0.5 m/s for improved safety ambulating in home    Baseline  0.14 m/s; 5/15: .261m  7/10: .36 m/s     Time  8    Period  Weeks    Status  Partially Met    Target Date  12/26/17      PT LONG TERM GOAL #3   Title  Pt's Bil hip F strength will improve to at least 2+/5 BLEs for improved gait mechanics and safety    Baseline  L: 2/5, R: 1/5 7/10: 2/5 bilaterally     Time  8    Period  Weeks    Status  Partially Met    Target Date  12/26/17      PT LONG TERM GOAL #4   Title  Pt's ABC Scale will improve to at least 40% to demonstrate improved balance    Baseline  28.13% 5/15; 24.4% 7/10: 23.4%     Time  6    Period  Weeks    Status  Partially Met    Target Date  12/26/17      PT LONG TERM GOAL #5   Title  Pt will be able to perform car  transfer without assist to work towards pt's goal of returning to driving    Baseline  only needs help with one leg to put onto running board to get into sisters car    Time  6    Period  Weeks    Status  Partially Met      Additional Long Term Goals   Additional Long Term Goals  Yes      PT LONG TERM GOAL #6   Title  Pt will be able to perform tub bench into/out of shower transfer without assist for improved independence with this task    Baseline  challenged with steps at this time, getting better, stands and gives sponge bath  independently now     Time  6    Period  Weeks    Status  Partially Met      PT LONG TERM GOAL #7   Title  Patient will perform cervical rotation at least 60 degrees, flexion and extension to 70 degrees, and sidebending to 40 degrees without pain    Baseline  4/30: R 50 L 34 Rotation, R 26 L 35 sb, Extension 35 flex 35; 5/21: extension 46, SB R 41 L 40, rotation R 62 L 61,     Time  4    Period  Weeks    Status  Achieved      PT LONG TERM GOAL #8   Title  Patient will improve NDI to <28% to reduce disability to mild  percieved disability for improved quality of life.     Baseline  4/30: 56% severe disability 5/21: 10%    Time  4    Period  Weeks    Status  Achieved      PT LONG TERM GOAL  #9   TITLE  Patient will report a worst pain of 3/10 on VAS in  cervical spine  to improve tolerance with ADLs and reduced symptoms with activities.     Baseline  4/30: 9/10 5/21: 0/10 in neck     Time  4    Period  Weeks    Status  Achieved      PT LONG TERM GOAL  #10   TITLE  Pt's 5xSTS will impove to equal to or below 50 seconds to demonstrate improved balance and BLE strength    Baseline  5/15: 1 min 30.9 seconds 7/10: 64 seconds     Time  6    Period  Weeks    Status  Partially Met    Target Date  12/26/17      PT LONG TERM GOAL  #11   TITLE  Patient will descend/ascend down a handicap ramp  with RW and with supervision to increase independence in home  environment.     Baseline  7/10 can ascend, unable to descend     Time  6    Period  Weeks    Status  New    Target Date  12/26/17            Plan - 12/20/17 1251    Clinical Impression Statement   Patient requires use of ice pack between interventions to decrease body temperature to improve maximum capacity for functional interventions. Patient demonstrated good muscular control with seated LE extensions and hip muscle activation. Patient will continue to benefit from skilled PT to improve mobility and independence     Rehab Potential  Good    PT Frequency  2x / week    PT Duration  8 weeks    PT Treatment/Interventions  ADLs/Self Care Home Management;Aquatic Therapy;Biofeedback;Cryotherapy;Electrical Stimulation;Iontophoresis 23m/ml Dexamethasone;Traction;Moist Heat;Ultrasound;DME Instruction;Gait training;Stair training;Functional mobility training;Therapeutic activities;Therapeutic exercise;Balance training;Neuromuscular re-education;Patient/family education;Orthotic Fit/Training;Wheelchair mobility training;Manual techniques;Compression bandaging;Passive range of motion;Dry needling;Energy conservation;Taping;Splinting    PT Next Visit Plan  assess need for new AFOs or other orthotic to assist with ambulation, initiate strengthening program, practice and improve technique for bed mobility and sit<>stand    PT Home Exercise Plan  to initiate next session (please incorporate hip flexor and adductor stretches in HEP if possible -KG)    Consulted and Agree with Plan of Care  Patient       Patient will benefit from skilled therapeutic intervention in  order to improve the following deficits and impairments:  Abnormal gait, Decreased activity tolerance, Decreased balance, Decreased coordination, Decreased endurance, Decreased knowledge of use of DME, Decreased mobility, Decreased range of motion, Decreased safety awareness, Decreased strength, Difficulty walking, Impaired perceived functional  ability, Impaired sensation, Impaired tone, Impaired UE functional use, Improper body mechanics, Postural dysfunction  Visit Diagnosis: Muscle weakness (generalized)  Unsteadiness on feet  Other abnormalities of gait and mobility     Problem List Patient Active Problem List   Diagnosis Date Noted  . Wrist fracture 01/16/2017  . Health care maintenance 01/24/2016  . Blood pressure elevated without history of HTN 10/25/2015  . Multiple sclerosis ([place]) 10/02/2015  . Chronic left shoulder pain 07/19/2015  . Multiple sclerosis exacerbation ([place]) 07/14/2015  . MS (multiple sclerosis) ([place]) 11/26/2014  . Increased body mass index 11/26/2014  . HPV test positive 11/26/2014  . Status post laparoscopic supracervical hysterectomy 11/26/2014  . Galactorrhea 11/26/2014  . Tobacco user 11/26/2014  . Back ache 05/21/2014  . Adiposity 05/21/2014  . Disordered sleep 05/21/2014  . Muscle spasticity 05/21/2014  . Calculus of kidney 12/09/2013  . Renal colic 00/92/3300  . Hypercholesteremia 08/19/2013  . Hereditary and idiopathic neuropathy 08/19/2013  . Hypercholesterolemia without hypertriglyceridemia 08/19/2013  . Bladder infection, chronic 07/25/2012  . Disorder of bladder function 07/25/2012  . Incomplete bladder emptying 07/25/2012  . Microscopic hematuria 07/25/2012   Janna Arch, PT, DPT   12/20/2017, 12:51 PM  [place] MAIN [facility] SERVICES [address] [place], [place], [address] Phone: [phone]   Fax:  [phone]  Name: Lisa Williams MRN: [id number] Date of Birth: 26-Jan-1961

## 2017-12-25 ENCOUNTER — Ambulatory Visit: Payer: PPO

## 2017-12-25 DIAGNOSIS — M6281 Muscle weakness (generalized): Secondary | ICD-10-CM | POA: Diagnosis not present

## 2017-12-25 DIAGNOSIS — R2689 Other abnormalities of gait and mobility: Secondary | ICD-10-CM

## 2017-12-25 DIAGNOSIS — R2681 Unsteadiness on feet: Secondary | ICD-10-CM

## 2017-12-25 NOTE — Therapy (Signed)
Lisa Williams [address] [place], [place], [address] Phone: [phone]   Fax:  [phone]  Physical Therapy Treatment Physical Therapy Progress Note   Dates of reporting period  11/14/17   to   12/25/17  Patient Details  Name: Lisa Williams MRN: [id number] Date of Birth: 1961/05/05 Referring Provider: Gurney Maxin, MD   Encounter Date: 12/25/2017  PT End of Session - 12/25/17 1147    Visit Number  33    Number of Visits  49    Date for PT Re-Evaluation  02/19/18    Authorization Type  10/10 ( next 1/10  PN start 8/20)    PT Start Time  1100    PT Stop Time  1147    PT Time Calculation (min)  47 min    Equipment Utilized During Treatment  Gait belt    Activity Tolerance  Patient tolerated treatment well    Behavior During Therapy  [facility] for tasks assessed/performed       Past Medical History:  Diagnosis Date  . Abdominal pain, right upper quadrant   . Back pain   . Calculus of kidney 12/09/2013  . Chronic back pain    unspecified  . Chronic left shoulder pain 07/19/2015  . Functional disorder of bladder    other  . Galactorrhea 11/26/2014   Chronic   . Hereditary and idiopathic neuropathy 08/19/2013  . HPV test positive   . Hypercholesteremia 08/19/2013  . Incomplete bladder emptying   . Microscopic hematuria   . MS (multiple sclerosis) ([place])   . Muscle spasticity 05/21/2014  . Nonspecific findings on examination of urine    other  . Osteopenia   . Status post laparoscopic supracervical hysterectomy 11/26/2014  . Tobacco user 11/26/2014  . Wrist fracture     Past Surgical History:  Procedure Laterality Date  . bilateral tubal ligation  1996  . BREAST CYST EXCISION Left 2002  . KNEE SURGERY     right  . LAPAROSCOPIC SUPRACERVICAL HYSTERECTOMY  08/05/2013  . ORIF WRIST FRACTURE Left 01/17/2017   Procedure: OPEN REDUCTION INTERNAL FIXATION (ORIF) WRIST FRACTURE;  Surgeon: Lovell Sheehan, MD;   Location: [facility] ORS;  Service: Orthopedics;  Laterality: Left;  . TUBAL LIGATION Bilateral   . VAGINAL HYSTERECTOMY  03/2006    There were no vitals filed for this visit.  Subjective Assessment - 12/25/17 1115    Subjective  Patient reports she has been doing well. Had a good weekend and has been compliant with HEP. Is more independent now that she is driving.     Patient is accompained by:  Family member    Pertinent History  Pt is a 57 y/o F who presents with BLE weakness and difficulty ambulating due to h/o MS that was diagnosed in 1995. Pt denies any pain associated. Pt was admitted to the hospital in November 2018 due to MS exacerbation. Nov-Dec to HHPT-->[facility] until end of Jan. After rehab the pt was able to ambulate up to 100 ft with the RW. Pt has Bil AFO, her therapist at [facility] thinks she needs a new AFO as her feet still drag when she ambulates, pt reports HHPT mentioned a spring AFO. Pt has custom AFOs that were originally made 4 years ago. Goals: improve balance, to be able to get into the shower with her tub bench without assist, to be able to get up 2 steps at sister's home with railings on both sides but too  far apart to reach both sides, to be able to drive adapted car. Pt is staying at other sister's house with one step to get inside and currently picking up L leg with UEs and then stepping in. Pt steps down leading with LLE. Pt wants to work on walking side to side as this is challenging and she had not yet progressed to this at rehab. Pt able to get in/out the car with assist only to bring BLEs into the car. Hoping to drive by June or July with adapted car (already has one). Pt needs assist bringing LEs into and out of hospital bed. Pt able to perform sit>stand without assist from [facility] with a pillow on the seat for a boost. Pt denies any falls in the past 6 months. Pt has been increasing her time alone at home up to a few hours at a time to become more independent. Pt has both  manual and power WC. Uses power WC at home, uses manual WC out in community. Pt able to self propel manual WC. Pt needs assist putting pants around ankles but she can pull them up. Pt can put on her shoes but needs help getting her leg up to do so. Pt able to tie her shoelaces on her own. Pt has a tub/shower unit at home with a tub bench. No grab bars in the shower. Has hand held shower head. Pt currently taking a sponge bath. Pt has a regular height toilet with a BSC over top. Pt with h/o L wrist fx with no precautions, pt wore her soft brace to evaluation for comfort but reports her wrist has healed and is doing well following OT.     Limitations  Lifting;Standing;Walking;House hold activities    How long can you stand comfortably?  3 minutes without UE assist, limited by fatigue    How long can you walk comfortably?  100 ft, limited by fatigue    Patient Stated Goals  see above    Currently in Pain?  No/denies       HEP  10 MWT= .42 m/s   Hip F strength  ABC= 22.2%  Car transfer: independent   Tub transfer  5x STS: 1 min 30 seconds with L foot keep slipping making patient have multiple attempts for one full stand. First trial   Attempted 5x STS first time with pillow slipping out backwards terminating.  Second sit to stand trial: PT blocking L foot.   Ascend/descend handicap ramp: achieved on 8/31   Still challenging to get into bed.   balloon taps seated 2 minutes RTB hamstring curl 10x each leg   Adduction isometric against PT resistance 10x 5 second holds; 2 sets  Abduction isometric against PT resistance 10x 5 second holds 2 sets      Patient's condition has the potential to improve in response to therapy. Maximum improvement is yet to be obtained. The anticipated improvement is attainable and reasonable in a generally predictable time. Start date of reporting period 11/14/17 end date of reporting period 12/25/17 Patient reports that she is able to ambulate better at  home. She feels more steady but is challenged by step that is from her garage to her house.                     PT Education - 12/25/17 1147    Education provided  Yes    Education Details  goal progression, exercise technique     Person(s) Educated  Patient    Methods  Explanation;Demonstration;Verbal cues    Comprehension  Verbalized understanding;Returned demonstration;Need further instruction       PT Short Term Goals - 12/25/17 1155      PT SHORT TERM GOAL #1   Title  Pt will be completed HEP with min assist from caregiver at least 4 days/wk for improved carryover between sessions    Baseline  HEP compliant     Time  2    Period  Weeks    Status  Partially Met      PT SHORT TERM GOAL #2   Title  Patient will report a worst pain of 6/10 on VAS in cervical spine to improve tolerance with ADLs and reduced symptoms with activities.     Baseline  4/30: 9/10 5/21: 0/10 pain    Time  2    Period  Weeks    Status  Achieved      PT SHORT TERM GOAL #3   Title  Patient will perform cervical rotation at least 50 degrees, flexion and extension to 70 degrees, and sidebending to 40 degrees without pain    Baseline  4/30: R 50 L 34 Rotation, R 26 L 35 sb, Extension 35 flex 35 ; 5/21: extension 46, SB R 41 L 40, rotation R 62 L 61,     Time  2    Period  Weeks    Status  Achieved        PT Long Term Goals - 12/25/17 1104      PT LONG TERM GOAL #1   Title  Pt's 5xSTS will impove to equal to or below 2 minutes to demonstrate improved balance and BLE strength    Baseline  3.02 minutes; 5/15: 1 min 30 seconds    Time  6    Period  Weeks    Status  Achieved      PT LONG TERM GOAL #2   Title  Pt's 19mT will improve to at least 0.5 m/s for improved safety ambulating in home    Baseline  0.14 m/s; 5/15: .228m  7/10: .36 m/s  8/20: .42 m/s     Time  8    Period  Weeks    Status  Partially Met    Target Date  02/19/18      PT LONG TERM GOAL #3   Title  Pt's Bil  hip F strength will improve to at least 2+/5 BLEs for improved gait mechanics and safety    Baseline  L: 2/5, R: 1/5 7/10: 2/5 bilaterally  8/20: 2/5 bilat    Time  8    Period  Weeks    Status  Partially Met    Target Date  02/19/18      PT LONG TERM GOAL #4   Title  Pt's ABC Scale will improve to at least 40% to demonstrate improved balance    Baseline  28.13% 5/15; 24.4% 7/10: 23.4% 8/20: 22. 2%    Time  6    Period  Weeks    Status  Partially Met    Target Date  02/19/18      PT LONG TERM GOAL #5   Title  Pt will be able to perform car transfer without assist to work towards pt's goal of returning to driving    Baseline  only needs help with one leg to put onto running board to get into sisters car; 8/20: in and out of own car independently.  Time  6    Period  Weeks    Status  Achieved      Additional Long Term Goals   Additional Long Term Goals  Yes      PT LONG TERM GOAL #6   Title  Pt will be able to perform tub bench into/out of shower transfer without assist for improved independence with this task    Baseline  challenged with steps at this time, getting better, stands and gives sponge bath independently now     Time  6    Period  Weeks    Status  Partially Met      PT LONG TERM GOAL #7   Title  Patient will perform cervical rotation at least 60 degrees, flexion and extension to 70 degrees, and sidebending to 40 degrees without pain    Baseline  4/30: R 50 L 34 Rotation, R 26 L 35 sb, Extension 35 flex 35; 5/21: extension 46, SB R 41 L 40, rotation R 62 L 61,     Time  4    Period  Weeks    Status  Achieved      PT LONG TERM GOAL #8   Title  Patient will improve NDI to <28% to reduce disability to mild  percieved disability for improved quality of life.     Baseline  4/30: 56% severe disability 5/21: 10%    Time  4    Period  Weeks    Status  Achieved      PT LONG TERM GOAL  #9   TITLE  Patient will report a worst pain of 3/10 on VAS in  cervical spine  to  improve tolerance with ADLs and reduced symptoms with activities.     Baseline  4/30: 9/10 5/21: 0/10 in neck     Time  4    Period  Weeks    Status  Achieved      PT LONG TERM GOAL  #10   TITLE  Pt's 5xSTS will impove to equal to or below 50 seconds to demonstrate improved balance and BLE strength    Baseline  5/15: 1 min 30.9 seconds 7/10: 64 seconds  8/20: 1 min 29 seconds with L foot frequently slipping out requiring patient to perform multiple attempts per one stand    Time  8    Period  Weeks    Status  Partially Met    Target Date  02/19/18      PT LONG TERM GOAL  #11   TITLE  Patient will descend/ascend down a handicap ramp  with RW and with supervision to increase independence in home environment.     Baseline  7/10 can ascend, unable to descend ; 8/20: achieved on 8/31    Time  6    Period  Weeks    Status  Achieved      PT LONG TERM GOAL  #12   TITLE  Patient will negotiate one step ~4 inches to enter house independently to increase functional independence in natural environment.     Baseline  8/20: unable to do     Time  8    Period  Weeks    Status  New    Target Date  02/19/18            Plan - 12/25/17 1155    Clinical Impression Statement  Patient has met two goals and made progress with others. She is able to negotiate incline and decline  of standard ADA slope without difficulty. 10 MWT improved to .42 m/s with RW and CGA and WC follow. Patient 5x STS time increased due to frequent L foot slipping resulting in multiple attempts to stand and occasional lifting of foot for re-situation. Patient overall is making progress with mobility and capacity for functional movement. atient's condition has the potential to improve in response to therapy. Maximum improvement is yet to be obtained. The anticipated improvement is attainable and reasonable in a generally predictable time. .Patient will continue to benefit from skilled PT to improve mobility and independence      Rehab Potential  Good    PT Frequency  2x / week    PT Duration  8 weeks    PT Treatment/Interventions  ADLs/Self Care Home Management;Aquatic Therapy;Biofeedback;Cryotherapy;Electrical Stimulation;Iontophoresis 57m/ml Dexamethasone;Traction;Moist Heat;Ultrasound;DME Instruction;Gait training;Stair training;Functional mobility training;Therapeutic activities;Therapeutic exercise;Balance training;Neuromuscular re-education;Patient/family education;Orthotic Fit/Training;Wheelchair mobility training;Manual techniques;Compression bandaging;Passive range of motion;Dry needling;Energy conservation;Taping;Splinting    PT Next Visit Plan  assess need for new AFOs or other orthotic to assist with ambulation, initiate strengthening program, practice and improve technique for bed mobility and sit<>stand    PT Home Exercise Plan  to initiate next session (please incorporate hip flexor and adductor stretches in HEP if possible -KG)    Consulted and Agree with Plan of Care  Patient       Patient will benefit from skilled therapeutic intervention in order to improve the following deficits and impairments:  Abnormal gait, Decreased activity tolerance, Decreased balance, Decreased coordination, Decreased endurance, Decreased knowledge of use of DME, Decreased mobility, Decreased range of motion, Decreased safety awareness, Decreased strength, Difficulty walking, Impaired perceived functional ability, Impaired sensation, Impaired tone, Impaired UE functional use, Improper body mechanics, Postural dysfunction  Visit Diagnosis: Muscle weakness (generalized)  Unsteadiness on feet  Other abnormalities of gait and mobility     Problem List Patient Active Problem List   Diagnosis Date Noted  . Wrist fracture 01/16/2017  . Health care maintenance 01/24/2016  . Blood pressure elevated without history of HTN 10/25/2015  . Multiple sclerosis ([place] 10/02/2015  . Chronic left shoulder pain 07/19/2015  . [place]  sclerosis exacerbation ([place] 07/14/2015  . [place]) [place] 11/26/2014  . Increased body mass index 11/26/2014  . HPV test positive 11/26/2014  . Status post laparoscopic supracervical hysterectomy 11/26/2014  . Galactorrhea 11/26/2014  . Tobacco user 11/26/2014  . Back ache 05/21/2014  . Adiposity 05/21/2014  . Disordered sleep 05/21/2014  . Muscle spasticity 05/21/2014  . Calculus of kidney 12/09/2013  . Renal colic 076/80/8811 . Hypercholesteremia 08/19/2013  . Hereditary and idiopathic neuropathy 08/19/2013  . Hypercholesterolemia without hypertriglyceridemia 08/19/2013  . Bladder infection, chronic 07/25/2012  . Disorder of bladder function 07/25/2012  . Incomplete bladder emptying 07/25/2012  . Microscopic hematuria 07/25/2012  MJanna Arch PT, DPT   12/25/2017, 11:57 AM  CThayneMAIN RShore Amb[facility] D[facility] Surgery CenterSERVICES [address] [place] [address]Phone: [phone]  Fax:  [phone] Name: JShloka BaldridgeRamseur MRN: [id number] of Birth: 6Jul 15, 1962

## 2017-12-27 ENCOUNTER — Ambulatory Visit: Payer: PPO

## 2017-12-27 DIAGNOSIS — M6281 Muscle weakness (generalized): Secondary | ICD-10-CM

## 2017-12-27 DIAGNOSIS — G35 Multiple sclerosis: Secondary | ICD-10-CM | POA: Diagnosis not present

## 2017-12-27 DIAGNOSIS — R2689 Other abnormalities of gait and mobility: Secondary | ICD-10-CM

## 2017-12-27 DIAGNOSIS — R2681 Unsteadiness on feet: Secondary | ICD-10-CM

## 2017-12-27 NOTE — Therapy (Signed)
[place] MAIN [facility] SERVICES [address] [place], [place], [address] Phone: [phone]   Fax:  [phone]  Physical Therapy Treatment  Patient Details  Name: Lisa Williams MRN: [id number] Date of Birth: 05/11/1960 Referring Provider: Gurney Maxin, MD   Encounter Date: 12/27/2017  PT End of Session - 12/27/17 1129    Visit Number  34    Number of Visits  49    Date for PT Re-Evaluation  02/19/18    Authorization Type  1/10 ( next 1/10  PN start 8/20)    PT Start Time  1116    PT Stop Time  1200    PT Time Calculation (min)  44 min    Equipment Utilized During Treatment  Gait belt    Activity Tolerance  Patient tolerated treatment well    Behavior During Therapy  [facility] for tasks assessed/performed       Past Medical History:  Diagnosis Date  . Abdominal pain, right upper quadrant   . Back pain   . Calculus of kidney 12/09/2013  . Chronic back pain    unspecified  . Chronic left shoulder pain 07/19/2015  . Functional disorder of bladder    other  . Galactorrhea 11/26/2014   Chronic   . Hereditary and idiopathic neuropathy 08/19/2013  . HPV test positive   . Hypercholesteremia 08/19/2013  . Incomplete bladder emptying   . Microscopic hematuria   . MS (multiple sclerosis) ([place])   . Muscle spasticity 05/21/2014  . Nonspecific findings on examination of urine    other  . Osteopenia   . Status post laparoscopic supracervical hysterectomy 11/26/2014  . Tobacco user 11/26/2014  . Wrist fracture     Past Surgical History:  Procedure Laterality Date  . bilateral tubal ligation  1996  . BREAST CYST EXCISION Left 2002  . KNEE SURGERY     right  . LAPAROSCOPIC SUPRACERVICAL HYSTERECTOMY  08/05/2013  . ORIF WRIST FRACTURE Left 01/17/2017   Procedure: OPEN REDUCTION INTERNAL FIXATION (ORIF) WRIST FRACTURE;  Surgeon: Lovell Sheehan, MD;  Location: [facility] ORS;  Service: Orthopedics;  Laterality: Left;  . TUBAL LIGATION Bilateral    . VAGINAL HYSTERECTOMY  03/2006    There were no vitals filed for this visit.  Subjective Assessment - 12/27/17 1126    Subjective  Patient was upstairs visiting a friend who had surgery. Patient reports compliance with HEP.     Patient is accompained by:  Family member    Pertinent History  Pt is a 57 y/o F who presents with BLE weakness and difficulty ambulating due to h/o MS that was diagnosed in 1995. Pt denies any pain associated. Pt was admitted to the hospital in November 2018 due to MS exacerbation. Nov-Dec to HHPT-->[facility] until end of Jan. After rehab the pt was able to ambulate up to 100 ft with the RW. Pt has Bil AFO, her therapist at [facility] thinks she needs a new AFO as her feet still drag when she ambulates, pt reports HHPT mentioned a spring AFO. Pt has custom AFOs that were originally made 4 years ago. Goals: improve balance, to be able to get into the shower with her tub bench without assist, to be able to get up 2 steps at sister's home with railings on both sides but too far apart to reach both sides, to be able to drive adapted car. Pt is staying at other sister's house with one step to get inside and currently picking  up L leg with UEs and then stepping in. Pt steps down leading with LLE. Pt wants to work on walking side to side as this is challenging and she had not yet progressed to this at rehab. Pt able to get in/out the car with assist only to bring BLEs into the car. Hoping to drive by June or July with adapted car (already has one). Pt needs assist bringing LEs into and out of hospital bed. Pt able to perform sit>stand without assist from [facility] with a pillow on the seat for a boost. Pt denies any falls in the past 6 months. Pt has been increasing her time alone at home up to a few hours at a time to become more independent. Pt has both manual and power WC. Uses power WC at home, uses manual WC out in community. Pt able to self propel manual WC. Pt needs assist putting  pants around ankles but she can pull them up. Pt can put on her shoes but needs help getting her leg up to do so. Pt able to tie her shoelaces on her own. Pt has a tub/shower unit at home with a tub bench. No grab bars in the shower. Has hand held shower head. Pt currently taking a sponge bath. Pt has a regular height toilet with a BSC over top. Pt with h/o L wrist fx with no precautions, pt wore her soft brace to evaluation for comfort but reports her wrist has healed and is doing well following OT.     Limitations  Lifting;Standing;Walking;House hold activities    How long can you stand comfortably?  3 minutes without UE assist, limited by fatigue    How long can you walk comfortably?  100 ft, limited by fatigue    Patient Stated Goals  see above    Currently in Pain?  No/denies         Ambulation in // bars 10' x4lengths, Cues to avoid trunk flexion and to initiate step by moving pelvis forward.verbal cues for hip flexion and knee extension; improved step length and decreased foot drag  March 10x each leg BUE support;Improved clearance of R foot with cues for knee to sky; 2 sets in a row   2" step toe taps 10x each leg BUE support, cues for bringing knees to ceiling (hip flexion) ; unable to bring foot on top of step.    Sit to stand from elevated seat with UE assist on // bars x5  Standing hip abduction 10x each leg      Seated:   TrA activation 12x 3 second holds  Seated gluteal squeezes 15x 3 seconds holds  Balloon pass with PT to challenge patient reach within and outside of BOS for core activation. LE supported 3 minutesx2 trials  Seated knee extensions,12 x each leg, Min A for superior shift to reduce friction   Seated adduction resistance against hand 10x 3 second holds BLE  Seated abduction resistance against hand 10x 3 second holds  Pt. response to medical necessity: Patient will continue to benefit from skilled PT to improve mobility and  independence                       PT Education - 12/27/17 1129    Education provided  Yes    Education Details  exercise technique     Person(s) Educated  Patient    Methods  Explanation;Demonstration;Verbal cues    Comprehension  Verbalized understanding;Returned demonstration  PT Short Term Goals - 12/25/17 1155      PT SHORT TERM GOAL #1   Title  Pt will be completed HEP with min assist from caregiver at least 4 days/wk for improved carryover between sessions    Baseline  HEP compliant     Time  2    Period  Weeks    Status  Partially Met      PT SHORT TERM GOAL #2   Title  Patient will report a worst pain of 6/10 on VAS in cervical spine to improve tolerance with ADLs and reduced symptoms with activities.     Baseline  4/30: 9/10 5/21: 0/10 pain    Time  2    Period  Weeks    Status  Achieved      PT SHORT TERM GOAL #3   Title  Patient will perform cervical rotation at least 50 degrees, flexion and extension to 70 degrees, and sidebending to 40 degrees without pain    Baseline  4/30: R 50 L 34 Rotation, R 26 L 35 sb, Extension 35 flex 35 ; 5/21: extension 46, SB R 41 L 40, rotation R 62 L 61,     Time  2    Period  Weeks    Status  Achieved        PT Long Term Goals - 12/25/17 1104      PT LONG TERM GOAL #1   Title  Pt's 5xSTS will impove to equal to or below 2 minutes to demonstrate improved balance and BLE strength    Baseline  3.02 minutes; 5/15: 1 min 30 seconds    Time  6    Period  Weeks    Status  Achieved      PT LONG TERM GOAL #2   Title  Pt's 63mT will improve to at least 0.5 m/s for improved safety ambulating in home    Baseline  0.14 m/s; 5/15: .277m  7/10: .36 m/s  8/20: .42 m/s     Time  8    Period  Weeks    Status  Partially Met    Target Date  02/19/18      PT LONG TERM GOAL #3   Title  Pt's Bil hip F strength will improve to at least 2+/5 BLEs for improved gait mechanics and safety    Baseline  L: 2/5, R: 1/5  7/10: 2/5 bilaterally  8/20: 2/5 bilat    Time  8    Period  Weeks    Status  Partially Met    Target Date  02/19/18      PT LONG TERM GOAL #4   Title  Pt's ABC Scale will improve to at least 40% to demonstrate improved balance    Baseline  28.13% 5/15; 24.4% 7/10: 23.4% 8/20: 22. 2%    Time  6    Period  Weeks    Status  Partially Met    Target Date  02/19/18      PT LONG TERM GOAL #5   Title  Pt will be able to perform car transfer without assist to work towards pt's goal of returning to driving    Baseline  only needs help with one leg to put onto running board to get into sisters car; 8/20: in and out of own car independently.     Time  6    Period  Weeks    Status  Achieved      Additional Long  Term Goals   Additional Long Term Goals  Yes      PT LONG TERM GOAL #6   Title  Pt will be able to perform tub bench into/out of shower transfer without assist for improved independence with this task    Baseline  challenged with steps at this time, getting better, stands and gives sponge bath independently now     Time  6    Period  Weeks    Status  Partially Met      PT LONG TERM GOAL #7   Title  Patient will perform cervical rotation at least 60 degrees, flexion and extension to 70 degrees, and sidebending to 40 degrees without pain    Baseline  4/30: R 50 L 34 Rotation, R 26 L 35 sb, Extension 35 flex 35; 5/21: extension 46, SB R 41 L 40, rotation R 62 L 61,     Time  4    Period  Weeks    Status  Achieved      PT LONG TERM GOAL #8   Title  Patient will improve NDI to <28% to reduce disability to mild  percieved disability for improved quality of life.     Baseline  4/30: 56% severe disability 5/21: 10%    Time  4    Period  Weeks    Status  Achieved      PT LONG TERM GOAL  #9   TITLE  Patient will report a worst pain of 3/10 on VAS in  cervical spine  to improve tolerance with ADLs and reduced symptoms with activities.     Baseline  4/30: 9/10 5/21: 0/10 in neck      Time  4    Period  Weeks    Status  Achieved      PT LONG TERM GOAL  #10   TITLE  Pt's 5xSTS will impove to equal to or below 50 seconds to demonstrate improved balance and BLE strength    Baseline  5/15: 1 min 30.9 seconds 7/10: 64 seconds  8/20: 1 min 29 seconds with L foot frequently slipping out requiring patient to perform multiple attempts per one stand    Time  8    Period  Weeks    Status  Partially Met    Target Date  02/19/18      PT LONG TERM GOAL  #11   TITLE  Patient will descend/ascend down a handicap ramp  with RW and with supervision to increase independence in home environment.     Baseline  7/10 can ascend, unable to descend ; 8/20: achieved on 8/31    Time  6    Period  Weeks    Status  Achieved      PT LONG TERM GOAL  #12   TITLE  Patient will negotiate one step ~4 inches to enter house independently to increase functional independence in natural environment.     Baseline  8/20: unable to do     Time  8    Period  Weeks    Status  New    Target Date  02/19/18            Plan - 12/27/17 1138    Clinical Impression Statement  Patient demonstrates carryover from hip flexion and knee extension exercises to ambulating in // bars with better clearance of bilateral feet. Patient challenged with prolonged standing capacity and lifting foot high enough to clear small obstacles. Patient will continue to  benefit from skilled PT to improve mobility and independence     Rehab Potential  Good    PT Frequency  2x / week    PT Duration  8 weeks    PT Treatment/Interventions  ADLs/Self Care Home Management;Aquatic Therapy;Biofeedback;Cryotherapy;Electrical Stimulation;Iontophoresis 93m/ml Dexamethasone;Traction;Moist Heat;Ultrasound;DME Instruction;Gait training;Stair training;Functional mobility training;Therapeutic activities;Therapeutic exercise;Balance training;Neuromuscular re-education;Patient/family education;Orthotic Fit/Training;Wheelchair mobility training;Manual  techniques;Compression bandaging;Passive range of motion;Dry needling;Energy conservation;Taping;Splinting    PT Next Visit Plan  assess need for new AFOs or other orthotic to assist with ambulation, initiate strengthening program, practice and improve technique for bed mobility and sit<>stand    PT Home Exercise Plan  to initiate next session (please incorporate hip flexor and adductor stretches in HEP if possible -KG)    Consulted and Agree with Plan of Care  Patient       Patient will benefit from skilled therapeutic intervention in order to improve the following deficits and impairments:  Abnormal gait, Decreased activity tolerance, Decreased balance, Decreased coordination, Decreased endurance, Decreased knowledge of use of DME, Decreased mobility, Decreased range of motion, Decreased safety awareness, Decreased strength, Difficulty walking, Impaired perceived functional ability, Impaired sensation, Impaired tone, Impaired UE functional use, Improper body mechanics, Postural dysfunction  Visit Diagnosis: Muscle weakness (generalized)  Unsteadiness on feet  Other abnormalities of gait and mobility     Problem List Patient Active Problem List   Diagnosis Date Noted  . Wrist fracture 01/16/2017  . Health care maintenance 01/24/2016  . Blood pressure elevated without history of HTN 10/25/2015  . Multiple [place] ([place] 10/02/2015  . Chronic left shoulder pain 07/19/2015  . [place] ([place] 07/14/2015  . [place]) [place] 11/26/2014  . Increased body mass index 11/26/2014  . HPV test positive 11/26/2014  . Status post laparoscopic supracervical hysterectomy 11/26/2014  . Galactorrhea 11/26/2014  . Tobacco user 11/26/2014  . Back ache 05/21/2014  . Adiposity 05/21/2014  . Disordered sleep 05/21/2014  . Muscle spasticity 05/21/2014  . Calculus of kidney 12/09/2013  . Renal colic 002/54/2706 . Hypercholesteremia 08/19/2013  . Hereditary and  idiopathic neuropathy 08/19/2013  . Hypercholesterolemia without hypertriglyceridemia 08/19/2013  . Bladder infection, chronic 07/25/2012  . Disorder of bladder function 07/25/2012  . Incomplete bladder emptying 07/25/2012  . Microscopic hematuria 07/25/2012   MJanna Arch PT, DPT   12/27/2017, 12:08 PM  [place] [facility] [address] [place] [address]Phone: [phone]  Fax:  [phone] Name: JKenika SahmRamseur MRN: [id number] of Birth: [birth year]

## 2018-01-01 ENCOUNTER — Ambulatory Visit: Payer: PPO

## 2018-01-01 DIAGNOSIS — M6281 Muscle weakness (generalized): Secondary | ICD-10-CM

## 2018-01-01 DIAGNOSIS — R2681 Unsteadiness on feet: Secondary | ICD-10-CM

## 2018-01-01 DIAGNOSIS — R2689 Other abnormalities of gait and mobility: Secondary | ICD-10-CM

## 2018-01-01 NOTE — Therapy (Signed)
[place] MAIN [facility] SERVICES [address] [place], [place], [address] Phone: [phone]   Fax:  [phone]  Physical Therapy Treatment  Patient Details  Name: Lisa Williams MRN: [id number] Date of Birth: [birth year] Referring Provider: Gurney Maxin, MD   Encounter Date: 01/01/2018  PT End of Session - 01/01/18 1133    Visit Number  35    Number of Visits  49    Date for PT Re-Evaluation  02/19/18    Authorization Type  2/10 ( PN start 8/20)    PT Start Time  1114    PT Stop Time  1200    PT Time Calculation (min)  46 min    Equipment Utilized During Treatment  Gait belt    Activity Tolerance  Patient tolerated treatment well    Behavior During Therapy  [facility] for tasks assessed/performed       Past Medical History:  Diagnosis Date  . Abdominal pain, right upper quadrant   . Back pain   . Calculus of kidney 12/09/2013  . Chronic back pain    unspecified  . Chronic left shoulder pain 07/19/2015  . Functional disorder of bladder    other  . Galactorrhea 11/26/2014   Chronic   . Hereditary and idiopathic neuropathy 08/19/2013  . HPV test positive   . Hypercholesteremia 08/19/2013  . Incomplete bladder emptying   . Microscopic hematuria   . MS (multiple sclerosis) ([address])   . Muscle spasticity 05/21/2014  . Nonspecific findings on examination of urine    other  . Osteopenia   . Status post laparoscopic supracervical hysterectomy 11/26/2014  . Tobacco user 11/26/2014  . Wrist fracture     Past Surgical History:  Procedure Laterality Date  . bilateral tubal ligation  1996  . BREAST CYST EXCISION Left 2002  . KNEE SURGERY     right  . LAPAROSCOPIC SUPRACERVICAL HYSTERECTOMY  08/05/2013  . ORIF WRIST FRACTURE Left 01/17/2017   Procedure: OPEN REDUCTION INTERNAL FIXATION (ORIF) WRIST FRACTURE;  Surgeon: Lovell Sheehan, MD;  Location: [facility] ORS;  Service: Orthopedics;  Laterality: Left;  . TUBAL LIGATION Bilateral   . VAGINAL  HYSTERECTOMY  03/2006    There were no vitals filed for this visit.  Subjective Assessment - 01/01/18 1132    Subjective  Patient reports she was able to help cook her parents dinner for their anniversary. Reports walking a littlle bit at home.     Patient is accompained by:  Family member    Pertinent History  Pt is a 57 y/o F who presents with BLE weakness and difficulty ambulating due to h/o MS that was diagnosed in 1995. Pt denies any pain associated. Pt was admitted to the hospital in November 2018 due to MS exacerbation. Nov-Dec to HHPT-->[facility] until end of Jan. After rehab the pt was able to ambulate up to 100 ft with the RW. Pt has Bil AFO, her therapist at [facility] thinks she needs a new AFO as her feet still drag when she ambulates, pt reports HHPT mentioned a spring AFO. Pt has custom AFOs that were originally made 4 years ago. Goals: improve balance, to be able to get into the shower with her tub bench without assist, to be able to get up 2 steps at sister's home with railings on both sides but too far apart to reach both sides, to be able to drive adapted car. Pt is staying at other sister's house with one step to get  inside and currently picking up L leg with UEs and then stepping in. Pt steps down leading with LLE. Pt wants to work on walking side to side as this is challenging and she had not yet progressed to this at rehab. Pt able to get in/out the car with assist only to bring BLEs into the car. Hoping to drive by June or July with adapted car (already has one). Pt needs assist bringing LEs into and out of hospital bed. Pt able to perform sit>stand without assist from [facility] with a pillow on the seat for a boost. Pt denies any falls in the past 6 months. Pt has been increasing her time alone at home up to a few hours at a time to become more independent. Pt has both manual and power WC. Uses power WC at home, uses manual WC out in community. Pt able to self propel manual WC. Pt needs  assist putting pants around ankles but she can pull them up. Pt can put on her shoes but needs help getting her leg up to do so. Pt able to tie her shoelaces on her own. Pt has a tub/shower unit at home with a tub bench. No grab bars in the shower. Has hand held shower head. Pt currently taking a sponge bath. Pt has a regular height toilet with a BSC over top. Pt with h/o L wrist fx with no precautions, pt wore her soft brace to evaluation for comfort but reports her wrist has healed and is doing well following OT.     Limitations  Lifting;Standing;Walking;House hold activities    How long can you stand comfortably?  3 minutes without UE assist, limited by fatigue    How long can you walk comfortably?  100 ft, limited by fatigue    Patient Stated Goals  see above    Currently in Pain?  No/denies         Seated:   TrA activation 15x 5 second   Isometrics : adduction 2x10 5 second holds, abduction 2x10 5 second holds against PT hand.   Gluteal squeezes 10x 5 second holds    Weighted ball (2000 Gr) toss 10x forward,10x R, 10x L .    Seated Hip flexion 10x each leg to touch PT hand. Unable to clear leg.    Ambulation in // bars 10' x4lengths, Cues to avoid trunk flexion and to initiate step by moving pelvis forward.verbal cues for hip flexion and knee extension; improved step length and decreased foot drag  Modified weight shift with knee bends 4x each leg BUE support   March 15x each leg BUE support;Improved clearance of R foot with cues for knee to sky;   Abduction 10x each leg BUE support, cues for keeping feet in line.   Ambulate backwards in // bars 3 ft x 5 trials with BUE support.                    PT Education - 01/01/18 1133    Education provided  Yes    Education Details  exercise technique     Person(s) Educated  Patient    Methods  Explanation;Demonstration;Verbal cues    Comprehension  Verbalized understanding;Returned demonstration        PT Short Term Goals - 12/25/17 1155      PT SHORT TERM GOAL #1   Title  Pt will be completed HEP with min assist from caregiver at least 4 days/wk for improved carryover between sessions  Baseline  HEP compliant     Time  2    Period  Weeks    Status  Partially Met      PT SHORT TERM GOAL #2   Title  Patient will report a worst pain of 6/10 on VAS in cervical spine to improve tolerance with ADLs and reduced symptoms with activities.     Baseline  4/30: 9/10 5/21: 0/10 pain    Time  2    Period  Weeks    Status  Achieved      PT SHORT TERM GOAL #3   Title  Patient will perform cervical rotation at least 50 degrees, flexion and extension to 70 degrees, and sidebending to 40 degrees without pain    Baseline  4/30: R 50 L 34 Rotation, R 26 L 35 sb, Extension 35 flex 35 ; 5/21: extension 46, SB R 41 L 40, rotation R 62 L 61,     Time  2    Period  Weeks    Status  Achieved        PT Long Term Goals - 12/25/17 1104      PT LONG TERM GOAL #1   Title  Pt's 5xSTS will impove to equal to or below 2 minutes to demonstrate improved balance and BLE strength    Baseline  3.02 minutes; 5/15: 1 min 30 seconds    Time  6    Period  Weeks    Status  Achieved      PT LONG TERM GOAL #2   Title  Pt's 54mT will improve to at least 0.5 m/s for improved safety ambulating in home    Baseline  0.14 m/s; 5/15: .241m  7/10: .36 m/s  8/20: .42 m/s     Time  8    Period  Weeks    Status  Partially Met    Target Date  02/19/18      PT LONG TERM GOAL #3   Title  Pt's Bil hip F strength will improve to at least 2+/5 BLEs for improved gait mechanics and safety    Baseline  L: 2/5, R: 1/5 7/10: 2/5 bilaterally  8/20: 2/5 bilat    Time  8    Period  Weeks    Status  Partially Met    Target Date  02/19/18      PT LONG TERM GOAL #4   Title  Pt's ABC Scale will improve to at least 40% to demonstrate improved balance    Baseline  28.13% 5/15; 24.4% 7/10: 23.4% 8/20: 22. 2%    Time  6     Period  Weeks    Status  Partially Met    Target Date  02/19/18      PT LONG TERM GOAL #5   Title  Pt will be able to perform car transfer without assist to work towards pt's goal of returning to driving    Baseline  only needs help with one leg to put onto running board to get into sisters car; 8/20: in and out of own car independently.     Time  6    Period  Weeks    Status  Achieved      Additional Long Term Goals   Additional Long Term Goals  Yes      PT LONG TERM GOAL #6   Title  Pt will be able to perform tub bench into/out of shower transfer without assist for improved independence with this  task    Baseline  challenged with steps at this time, getting better, stands and gives sponge bath independently now     Time  6    Period  Weeks    Status  Partially Met      PT LONG TERM GOAL #7   Title  Patient will perform cervical rotation at least 60 degrees, flexion and extension to 70 degrees, and sidebending to 40 degrees without pain    Baseline  4/30: R 50 L 34 Rotation, R 26 L 35 sb, Extension 35 flex 35; 5/21: extension 46, SB R 41 L 40, rotation R 62 L 61,     Time  4    Period  Weeks    Status  Achieved      PT LONG TERM GOAL #8   Title  Patient will improve NDI to <28% to reduce disability to mild  percieved disability for improved quality of life.     Baseline  4/30: 56% severe disability 5/21: 10%    Time  4    Period  Weeks    Status  Achieved      PT LONG TERM GOAL  #9   TITLE  Patient will report a worst pain of 3/10 on VAS in  cervical spine  to improve tolerance with ADLs and reduced symptoms with activities.     Baseline  4/30: 9/10 5/21: 0/10 in neck     Time  4    Period  Weeks    Status  Achieved      PT LONG TERM GOAL  #10   TITLE  Pt's 5xSTS will impove to equal to or below 50 seconds to demonstrate improved balance and BLE strength    Baseline  5/15: 1 min 30.9 seconds 7/10: 64 seconds  8/20: 1 min 29 seconds with L foot frequently slipping out  requiring patient to perform multiple attempts per one stand    Time  8    Period  Weeks    Status  Partially Met    Target Date  02/19/18      PT LONG TERM GOAL  #11   TITLE  Patient will descend/ascend down a handicap ramp  with RW and with supervision to increase independence in home environment.     Baseline  7/10 can ascend, unable to descend ; 8/20: achieved on 8/31    Time  6    Period  Weeks    Status  Achieved      PT LONG TERM GOAL  #12   TITLE  Patient will negotiate one step ~4 inches to enter house independently to increase functional independence in natural environment.     Baseline  8/20: unable to do     Time  8    Period  Weeks    Status  New    Target Date  02/19/18            Plan - 01/01/18 1136    Clinical Impression Statement  Patient is challenged by weight shift requiring knee bends for carryover to functional ambulation. Patient requires use of ice pack for cooling internal temperature between interventions. Patient will continue to benefit from skilled PT to improve mobility and independence     Rehab Potential  Good    PT Frequency  2x / week    PT Duration  8 weeks    PT Treatment/Interventions  ADLs/Self Care Home Management;Aquatic Therapy;Biofeedback;Cryotherapy;Electrical Stimulation;Iontophoresis 83m/ml Dexamethasone;Traction;Moist Heat;Ultrasound;DME Instruction;Gait training;Stair training;Functional mobility  training;Therapeutic activities;Therapeutic exercise;Balance training;Neuromuscular re-education;Patient/family education;Orthotic Fit/Training;Wheelchair mobility training;Manual techniques;Compression bandaging;Passive range of motion;Dry needling;Energy conservation;Taping;Splinting    PT Next Visit Plan  assess need for new AFOs or other orthotic to assist with ambulation, initiate strengthening program, practice and improve technique for bed mobility and sit<>stand    PT Home Exercise Plan  to initiate next session (please incorporate  hip flexor and adductor stretches in HEP if possible -KG)    Consulted and Agree with Plan of Care  Patient       Patient will benefit from skilled therapeutic intervention in order to improve the following deficits and impairments:  Abnormal gait, Decreased activity tolerance, Decreased balance, Decreased coordination, Decreased endurance, Decreased knowledge of use of DME, Decreased mobility, Decreased range of motion, Decreased safety awareness, Decreased strength, Difficulty walking, Impaired perceived functional ability, Impaired sensation, Impaired tone, Impaired UE functional use, Improper body mechanics, Postural dysfunction  Visit Diagnosis: Muscle weakness (generalized)  Unsteadiness on feet  Other abnormalities of gait and mobility     Problem List Patient Active Problem List   Diagnosis Date Noted  . Wrist fracture 01/16/2017  . Health care maintenance 01/24/2016  . Blood pressure elevated without history of HTN 10/25/2015  . Multiple sclerosis ([place]) 10/02/2015  . Chronic left shoulder pain 07/19/2015  . Multiple sclerosis exacerbation ([place]) 07/14/2015  . MS (multiple sclerosis) ([place]) 11/26/2014  . Increased body mass index 11/26/2014  . HPV test positive 11/26/2014  . Status post laparoscopic supracervical hysterectomy 11/26/2014  . Galactorrhea 11/26/2014  . Tobacco user 11/26/2014  . Back ache 05/21/2014  . Adiposity 05/21/2014  . Disordered sleep 05/21/2014  . Muscle spasticity 05/21/2014  . Calculus of kidney 12/09/2013  . Renal colic 16/02/9603  . Hypercholesteremia 08/19/2013  . Hereditary and idiopathic neuropathy 08/19/2013  . Hypercholesterolemia without hypertriglyceridemia 08/19/2013  . Bladder infection, chronic 07/25/2012  . Disorder of bladder function 07/25/2012  . Incomplete bladder emptying 07/25/2012  . Microscopic hematuria 07/25/2012   Janna Arch, PT, DPT   01/01/2018, 12:07 PM  [place] MAIN  [facility] SERVICES [address] [place], [place], [address] Phone: [phone]   Fax:  [phone]  Name: Lisa Williams MRN: [id number] Date of Birth: [birth year]

## 2018-01-03 ENCOUNTER — Ambulatory Visit: Payer: PPO

## 2018-01-03 DIAGNOSIS — R2681 Unsteadiness on feet: Secondary | ICD-10-CM

## 2018-01-03 DIAGNOSIS — M6281 Muscle weakness (generalized): Secondary | ICD-10-CM

## 2018-01-03 DIAGNOSIS — R2689 Other abnormalities of gait and mobility: Secondary | ICD-10-CM

## 2018-01-03 NOTE — Therapy (Signed)
[place] MAIN [facility] SERVICES [address] [place], [place], [address] Phone: [phone]   Fax:  [phone]  Physical Therapy Treatment  Patient Details  Name: Lisa Williams MRN: [id number] Date of Birth: [birth year] Referring Provider: Gurney Maxin, MD   Encounter Date: 01/03/2018  PT End of Session - 01/03/18 1148    Visit Number  36    Number of Visits  49    Date for PT Re-Evaluation  02/19/18    Authorization Type  3/10 ( PN start 8/20)    PT Start Time  1114    PT Stop Time  1200    PT Time Calculation (min)  46 min    Equipment Utilized During Treatment  Gait belt    Activity Tolerance  Patient tolerated treatment well    Behavior During Therapy  [facility] for tasks assessed/performed       Past Medical History:  Diagnosis Date  . Abdominal pain, right upper quadrant   . Back pain   . Calculus of kidney 12/09/2013  . Chronic back pain    unspecified  . Chronic left shoulder pain 07/19/2015  . Functional disorder of bladder    other  . Galactorrhea 11/26/2014   Chronic   . Hereditary and idiopathic neuropathy 08/19/2013  . HPV test positive   . Hypercholesteremia 08/19/2013  . Incomplete bladder emptying   . Microscopic hematuria   . MS (multiple sclerosis) ([place])   . Muscle spasticity 05/21/2014  . Nonspecific findings on examination of urine    other  . Osteopenia   . Status post laparoscopic supracervical hysterectomy 11/26/2014  . Tobacco user 11/26/2014  . Wrist fracture     Past Surgical History:  Procedure Laterality Date  . bilateral tubal ligation  1996  . BREAST CYST EXCISION Left 2002  . KNEE SURGERY     right  . LAPAROSCOPIC SUPRACERVICAL HYSTERECTOMY  08/05/2013  . ORIF WRIST FRACTURE Left 01/17/2017   Procedure: OPEN REDUCTION INTERNAL FIXATION (ORIF) WRIST FRACTURE;  Surgeon: Lovell Sheehan, MD;  Location: [facility] ORS;  Service: Orthopedics;  Laterality: Left;  . TUBAL LIGATION Bilateral   . VAGINAL  HYSTERECTOMY  03/2006    There were no vitals filed for this visit.  Subjective Assessment - 01/03/18 1144    Subjective  Patient reports having a good day yesterday. Compliant with HEP. Will go to [place] this weekend to shop at [employer] with sister.     Patient is accompained by:  Family member    Pertinent History  Pt is a 57 y/o F who presents with BLE weakness and difficulty ambulating due to h/o MS that was diagnosed in 1995. Pt denies any pain associated. Pt was admitted to the hospital in November 2018 due to MS exacerbation. Nov-Dec to HHPT-->[facility] until end of Jan. After rehab the pt was able to ambulate up to 100 ft with the RW. Pt has Bil AFO, her therapist at [facility] thinks she needs a new AFO as her feet still drag when she ambulates, pt reports HHPT mentioned a spring AFO. Pt has custom AFOs that were originally made 4 years ago. Goals: improve balance, to be able to get into the shower with her tub bench without assist, to be able to get up 2 steps at sister's home with railings on both sides but too far apart to reach both sides, to be able to drive adapted car. Pt is staying at other sister's house with one step to  get inside and currently picking up L leg with UEs and then stepping in. Pt steps down leading with LLE. Pt wants to work on walking side to side as this is challenging and she had not yet progressed to this at rehab. Pt able to get in/out the car with assist only to bring BLEs into the car. Hoping to drive by June or July with adapted car (already has one). Pt needs assist bringing LEs into and out of hospital bed. Pt able to perform sit>stand without assist from [facility] with a pillow on the seat for a boost. Pt denies any falls in the past 6 months. Pt has been increasing her time alone at home up to a few hours at a time to become more independent. Pt has both manual and power WC. Uses power WC at home, uses manual WC out in community. Pt able to self propel manual WC. Pt  needs assist putting pants around ankles but she can pull them up. Pt can put on her shoes but needs help getting her leg up to do so. Pt able to tie her shoelaces on her own. Pt has a tub/shower unit at home with a tub bench. No grab bars in the shower. Has hand held shower head. Pt currently taking a sponge bath. Pt has a regular height toilet with a BSC over top. Pt with h/o L wrist fx with no precautions, pt wore her soft brace to evaluation for comfort but reports her wrist has healed and is doing well following OT.     Limitations  Lifting;Standing;Walking;House hold activities    How long can you stand comfortably?  3 minutes without UE assist, limited by fatigue    How long can you walk comfortably?  100 ft, limited by fatigue    Patient Stated Goals  see above    Currently in Pain?  No/denies         Ambulate with RW and CGA in hall x 70 ft and x 50 ft with WC follow; verbal cueing for weight shift to offload limb in swing phase to allow improved step length. Patient verbal cueing of " shift and lift" to shift weight and lift knee to improve gait mechanics.   Prolonged seated rest break between each trial with ice to reduce overheating  Isometrics : adduction 2x10 5 second holds, abduction 2x10 5 second holds against PT hand.  Sit to stand from [facility] to walker x 4 trials with good utilization of LE's to reduce .   Weighted ball (2000 Gr) upright raises 10x , horizontal turns 10x each leg ' ; 2 sets each direction   Seated balloon taps x 3 minutes reaching inside and outside BOS ; 2 sets                       PT Education - 01/03/18 1145    Education provided  Yes    Education Details  exerise technique, ambulatory capacity     Person(s) Educated  Patient    Methods  Explanation;Demonstration;Verbal cues    Comprehension  Verbalized understanding;Returned demonstration       PT Short Term Goals - 12/25/17 1155      PT SHORT TERM GOAL #1   Title  Pt will be  completed HEP with min assist from caregiver at least 4 days/wk for improved carryover between sessions    Baseline  HEP compliant     Time  2    Period  Weeks    Status  Partially Met      PT SHORT TERM GOAL #2   Title  Patient will report a worst pain of 6/10 on VAS in cervical spine to improve tolerance with ADLs and reduced symptoms with activities.     Baseline  4/30: 9/10 5/21: 0/10 pain    Time  2    Period  Weeks    Status  Achieved      PT SHORT TERM GOAL #3   Title  Patient will perform cervical rotation at least 50 degrees, flexion and extension to 70 degrees, and sidebending to 40 degrees without pain    Baseline  4/30: R 50 L 34 Rotation, R 26 L 35 sb, Extension 35 flex 35 ; 5/21: extension 46, SB R 41 L 40, rotation R 62 L 61,     Time  2    Period  Weeks    Status  Achieved        PT Long Term Goals - 12/25/17 1104      PT LONG TERM GOAL #1   Title  Pt's 5xSTS will impove to equal to or below 2 minutes to demonstrate improved balance and BLE strength    Baseline  3.02 minutes; 5/15: 1 min 30 seconds    Time  6    Period  Weeks    Status  Achieved      PT LONG TERM GOAL #2   Title  Pt's 44mT will improve to at least 0.5 m/s for improved safety ambulating in home    Baseline  0.14 m/s; 5/15: .220m  7/10: .36 m/s  8/20: .42 m/s     Time  8    Period  Weeks    Status  Partially Met    Target Date  02/19/18      PT LONG TERM GOAL #3   Title  Pt's Bil hip F strength will improve to at least 2+/5 BLEs for improved gait mechanics and safety    Baseline  L: 2/5, R: 1/5 7/10: 2/5 bilaterally  8/20: 2/5 bilat    Time  8    Period  Weeks    Status  Partially Met    Target Date  02/19/18      PT LONG TERM GOAL #4   Title  Pt's ABC Scale will improve to at least 40% to demonstrate improved balance    Baseline  28.13% 5/15; 24.4% 7/10: 23.4% 8/20: 22. 2%    Time  6    Period  Weeks    Status  Partially Met    Target Date  02/19/18      PT LONG TERM GOAL #5    Title  Pt will be able to perform car transfer without assist to work towards pt's goal of returning to driving    Baseline  only needs help with one leg to put onto running board to get into sisters car; 8/20: in and out of own car independently.     Time  6    Period  Weeks    Status  Achieved      Additional Long Term Goals   Additional Long Term Goals  Yes      PT LONG TERM GOAL #6   Title  Pt will be able to perform tub bench into/out of shower transfer without assist for improved independence with this task    Baseline  challenged with steps at this time, getting better, stands and  gives sponge bath independently now     Time  6    Period  Weeks    Status  Partially Met      PT LONG TERM GOAL #7   Title  Patient will perform cervical rotation at least 60 degrees, flexion and extension to 70 degrees, and sidebending to 40 degrees without pain    Baseline  4/30: R 50 L 34 Rotation, R 26 L 35 sb, Extension 35 flex 35; 5/21: extension 46, SB R 41 L 40, rotation R 62 L 61,     Time  4    Period  Weeks    Status  Achieved      PT LONG TERM GOAL #8   Title  Patient will improve NDI to <28% to reduce disability to mild  percieved disability for improved quality of life.     Baseline  4/30: 56% severe disability 5/21: 10%    Time  4    Period  Weeks    Status  Achieved      PT LONG TERM GOAL  #9   TITLE  Patient will report a worst pain of 3/10 on VAS in  cervical spine  to improve tolerance with ADLs and reduced symptoms with activities.     Baseline  4/30: 9/10 5/21: 0/10 in neck     Time  4    Period  Weeks    Status  Achieved      PT LONG TERM GOAL  #10   TITLE  Pt's 5xSTS will impove to equal to or below 50 seconds to demonstrate improved balance and BLE strength    Baseline  5/15: 1 min 30.9 seconds 7/10: 64 seconds  8/20: 1 min 29 seconds with L foot frequently slipping out requiring patient to perform multiple attempts per one stand    Time  8    Period  Weeks    Status   Partially Met    Target Date  02/19/18      PT LONG TERM GOAL  #11   TITLE  Patient will descend/ascend down a handicap ramp  with RW and with supervision to increase independence in home environment.     Baseline  7/10 can ascend, unable to descend ; 8/20: achieved on 8/31    Time  6    Period  Weeks    Status  Achieved      PT LONG TERM GOAL  #12   TITLE  Patient will negotiate one step ~4 inches to enter house independently to increase functional independence in natural environment.     Baseline  8/20: unable to do     Time  8    Period  Weeks    Status  New    Target Date  02/19/18            Plan - 01/03/18 1150    Clinical Impression Statement  Patient demonstrating improved capacity for ambulation with improved weight shift to allow swing phase to be activated bilaterally with right leg >left. Patient will continue to benefit from skilled PT to improve mobility and independence     Rehab Potential  Good    PT Frequency  2x / week    PT Duration  8 weeks    PT Treatment/Interventions  ADLs/Self Care Home Management;Aquatic Therapy;Biofeedback;Cryotherapy;Electrical Stimulation;Iontophoresis 66m/ml Dexamethasone;Traction;Moist Heat;Ultrasound;DME Instruction;Gait training;Stair training;Functional mobility training;Therapeutic activities;Therapeutic exercise;Balance training;Neuromuscular re-education;Patient/family education;Orthotic Fit/Training;Wheelchair mobility training;Manual techniques;Compression bandaging;Passive range of motion;Dry needling;Energy conservation;Taping;Splinting    PT  Next Visit Plan  assess need for new AFOs or other orthotic to assist with ambulation, initiate strengthening program, practice and improve technique for bed mobility and sit<>stand    PT Home Exercise Plan  to initiate next session (please incorporate hip flexor and adductor stretches in HEP if possible -KG)    Consulted and Agree with Plan of Care  Patient       Patient will benefit  from skilled therapeutic intervention in order to improve the following deficits and impairments:  Abnormal gait, Decreased activity tolerance, Decreased balance, Decreased coordination, Decreased endurance, Decreased knowledge of use of DME, Decreased mobility, Decreased range of motion, Decreased safety awareness, Decreased strength, Difficulty walking, Impaired perceived functional ability, Impaired sensation, Impaired tone, Impaired UE functional use, Improper body mechanics, Postural dysfunction  Visit Diagnosis: Muscle weakness (generalized)  Unsteadiness on feet  Other abnormalities of gait and mobility     Problem List Patient Active Problem List   Diagnosis Date Noted  . Wrist fracture 01/16/2017  . Health care maintenance 01/24/2016  . Blood pressure elevated without history of HTN 10/25/2015  . Multiple sclerosis ([place]) 10/02/2015  . Chronic left shoulder pain 07/19/2015  . Multiple sclerosis exacerbation ([place]) 07/14/2015  . MS (multiple sclerosis) ([place]) 11/26/2014  . Increased body mass index 11/26/2014  . HPV test positive 11/26/2014  . Status post laparoscopic supracervical hysterectomy 11/26/2014  . Galactorrhea 11/26/2014  . Tobacco user 11/26/2014  . Back ache 05/21/2014  . Adiposity 05/21/2014  . Disordered sleep 05/21/2014  . Muscle spasticity 05/21/2014  . Calculus of kidney 12/09/2013  . Renal colic 80/32/1224  . Hypercholesteremia 08/19/2013  . Hereditary and idiopathic neuropathy 08/19/2013  . Hypercholesterolemia without hypertriglyceridemia 08/19/2013  . Bladder infection, chronic 07/25/2012  . Disorder of bladder function 07/25/2012  . Incomplete bladder emptying 07/25/2012  . Microscopic hematuria 07/25/2012   Janna Arch, PT, DPT   01/03/2018, 12:00 PM  [place] MAIN [facility] SERVICES [address] [place], [place], [address] Phone: [phone]   Fax:  [phone]  Name: Lisa Williams MRN:  [id number] Date of Birth: 04/07/1961

## 2018-01-10 ENCOUNTER — Ambulatory Visit: Payer: PPO | Attending: Neurology

## 2018-01-10 DIAGNOSIS — R2681 Unsteadiness on feet: Secondary | ICD-10-CM | POA: Insufficient documentation

## 2018-01-10 DIAGNOSIS — R2689 Other abnormalities of gait and mobility: Secondary | ICD-10-CM | POA: Diagnosis not present

## 2018-01-10 DIAGNOSIS — M6281 Muscle weakness (generalized): Secondary | ICD-10-CM | POA: Diagnosis not present

## 2018-01-10 NOTE — Therapy (Signed)
[place] [facility] SERVICES [address] [place], [place], [address] Phone: [phone]   Fax:  [phone]  Physical Therapy Treatment  Patient Details  Name: Lisa Williams MRN: [id number] Date of Birth: 29-Jul-1960 Referring Provider: Gurney Maxin, MD   Encounter Date: 01/10/2018  PT End of Session - 01/10/18 1253    Visit Number  37    Number of Visits  49    Date for PT Re-Evaluation  02/19/18    Authorization Type  4/10 ( PN start 8/20)    PT Start Time  1030    PT Stop Time  1115    PT Time Calculation (min)  45 min    Equipment Utilized During Treatment  Gait belt    Activity Tolerance  Patient tolerated treatment well    Behavior During Therapy  [facility] for tasks assessed/performed       Past Medical History:  Diagnosis Date  . Abdominal pain, right upper quadrant   . Back pain   . Calculus of kidney 12/09/2013  . Chronic back pain    unspecified  . Chronic left shoulder pain 07/19/2015  . Functional disorder of bladder    other  . Galactorrhea 11/26/2014   Chronic   . Hereditary and idiopathic neuropathy 08/19/2013  . HPV test positive   . Hypercholesteremia 08/19/2013  . Incomplete bladder emptying   . Microscopic hematuria   . MS (multiple sclerosis) ([place])   . Muscle spasticity 05/21/2014  . Nonspecific findings on examination of urine    other  . Osteopenia   . Status post laparoscopic supracervical hysterectomy 11/26/2014  . Tobacco user 11/26/2014  . Wrist fracture     Past Surgical History:  Procedure Laterality Date  . bilateral tubal ligation  1996  . BREAST CYST EXCISION Left 2002  . KNEE SURGERY     right  . LAPAROSCOPIC SUPRACERVICAL HYSTERECTOMY  08/05/2013  . ORIF WRIST FRACTURE Left 01/17/2017   Procedure: OPEN REDUCTION INTERNAL FIXATION (ORIF) WRIST FRACTURE;  Surgeon: Lovell Sheehan, MD;  Location: [facility] ORS;  Service: Orthopedics;  Laterality: Left;  . TUBAL LIGATION Bilateral   . VAGINAL  HYSTERECTOMY  03/2006    There were no vitals filed for this visit.  Subjective Assessment - 01/10/18 1036    Subjective  Patient reports waking up with back pain in the center. Went to [employer] and [place] over the weekend. Has a history of herniated disc. Pain is going down into leg    Patient is accompained by:  Family member    Pertinent History  Pt is a 57 y/o F who presents with BLE weakness and difficulty ambulating due to h/o MS that was diagnosed in 1995. Pt denies any pain associated. Pt was admitted to the hospital in November 2018 due to MS exacerbation. Nov-Dec to HHPT-->[facility] until end of Jan. After rehab the pt was able to ambulate up to 100 ft with the RW. Pt has Bil AFO, her therapist at [place] thinks she needs a new AFO as her feet still drag when she ambulates, pt reports HHPT mentioned a spring AFO. Pt has custom AFOs that were originally made 4 years ago. Goals: improve balance, to be able to get into the shower with her tub bench without assist, to be able to get up 2 steps at sister's home with railings on both sides but too far apart to reach both sides, to be able to drive adapted car. Pt is staying at  other sister's house with one step to get inside and currently picking up L leg with UEs and then stepping in. Pt steps down leading with LLE. Pt wants to work on walking side to side as this is challenging and she had not yet progressed to this at rehab. Pt able to get in/out the car with assist only to bring BLEs into the car. Hoping to drive by June or July with adapted car (already has one). Pt needs assist bringing LEs into and out of hospital bed. Pt able to perform sit>stand without assist from [facility] with a pillow on the seat for a boost. Pt denies any falls in the past 6 months. Pt has been increasing her time alone at home up to a few hours at a time to become more independent. Pt has both manual and power WC. Uses power WC at home, uses manual WC out in  community. Pt able to self propel manual WC. Pt needs assist putting pants around ankles but she can pull them up. Pt can put on her shoes but needs help getting her leg up to do so. Pt able to tie her shoelaces on her own. Pt has a tub/shower unit at home with a tub bench. No grab bars in the shower. Has hand held shower head. Pt currently taking a sponge bath. Pt has a regular height toilet with a BSC over top. Pt with h/o L wrist fx with no precautions, pt wore her soft brace to evaluation for comfort but reports her wrist has healed and is doing well following OT.     Limitations  Lifting;Standing;Walking;House hold activities    How long can you stand comfortably?  3 minutes without UE assist, limited by fatigue    How long can you walk comfortably?  100 ft, limited by fatigue    Patient Stated Goals  see above    Currently in Pain?  Yes    Pain Score  6     Pain Location  Back    Pain Orientation  Lower;Mid    Pain Descriptors / Indicators  Shooting;Throbbing    Pain Type  Acute pain    Pain Radiating Towards  medial thigh    Pain Onset  Today    Pain Frequency  Constant         Supine with wedge under head  Manual  Hamstring  Popliteal angle  Abductor  FABER stretch: 60 seconds each leg  FAIR stretch : 60 seconds L and R side  One knee to chest: 60 seconds L and R side to reduce low back pain  LE rotation: 2 minutes to reduce back pain  Open prayer 60 seconds to L and R TherEx Supine with wedge under head and bolster under knees  TrA activation 10x 3 second holds  4lb bar overhead raises 15x  2lb dumbbell cross body modified crunch; 10x each side   4lb press straight for upper abdomens. 10x   pilates press 20 seconds x3  Posterior tilt 10x   Gluteal squeezes 10x 3 second holds   laq 10x each leg BLE   Adduction squeezes 15x 3 second Abduction RTB 15x    Back pain 3/10                        PT Education - 01/10/18 1251    Education provided  Yes     Education Details  exercise technique,     Person(s) Educated  Patient    Methods  Explanation    Comprehension  Verbalized understanding       PT Short Term Goals - 12/25/17 1155      PT SHORT TERM GOAL #1   Title  Pt will be completed HEP with min assist from caregiver at least 4 days/wk for improved carryover between sessions    Baseline  HEP compliant     Time  2    Period  Weeks    Status  Partially Met      PT SHORT TERM GOAL #2   Title  Patient will report a worst pain of 6/10 on VAS in cervical spine to improve tolerance with ADLs and reduced symptoms with activities.     Baseline  4/30: 9/10 5/21: 0/10 pain    Time  2    Period  Weeks    Status  Achieved      PT SHORT TERM GOAL #3   Title  Patient will perform cervical rotation at least 50 degrees, flexion and extension to 70 degrees, and sidebending to 40 degrees without pain    Baseline  4/30: R 50 L 34 Rotation, R 26 L 35 sb, Extension 35 flex 35 ; 5/21: extension 46, SB R 41 L 40, rotation R 62 L 61,     Time  2    Period  Weeks    Status  Achieved        PT Long Term Goals - 12/25/17 1104      PT LONG TERM GOAL #1   Title  Pt's 5xSTS will impove to equal to or below 2 minutes to demonstrate improved balance and BLE strength    Baseline  3.02 minutes; 5/15: 1 min 30 seconds    Time  6    Period  Weeks    Status  Achieved      PT LONG TERM GOAL #2   Title  Pt's 17mT will improve to at least 0.5 m/s for improved safety ambulating in home    Baseline  0.14 m/s; 5/15: .236m  7/10: .36 m/s  8/20: .42 m/s     Time  8    Period  Weeks    Status  Partially Met    Target Date  02/19/18      PT LONG TERM GOAL #3   Title  Pt's Bil hip F strength will improve to at least 2+/5 BLEs for improved gait mechanics and safety    Baseline  L: 2/5, R: 1/5 7/10: 2/5 bilaterally  8/20: 2/5 bilat    Time  8    Period  Weeks    Status  Partially Met    Target Date  02/19/18      PT LONG TERM GOAL #4   Title  Pt's  ABC Scale will improve to at least 40% to demonstrate improved balance    Baseline  28.13% 5/15; 24.4% 7/10: 23.4% 8/20: 22. 2%    Time  6    Period  Weeks    Status  Partially Met    Target Date  02/19/18      PT LONG TERM GOAL #5   Title  Pt will be able to perform car transfer without assist to work towards pt's goal of returning to driving    Baseline  only needs help with one leg to put onto running board to get into sisters car; 8/20: in and out of own car independently.     Time  6    Period  Weeks    Status  Achieved      Additional Long Term Goals   Additional Long Term Goals  Yes      PT LONG TERM GOAL #6   Title  Pt will be able to perform tub bench into/out of shower transfer without assist for improved independence with this task    Baseline  challenged with steps at this time, getting better, stands and gives sponge bath independently now     Time  6    Period  Weeks    Status  Partially Met      PT LONG TERM GOAL #7   Title  Patient will perform cervical rotation at least 60 degrees, flexion and extension to 70 degrees, and sidebending to 40 degrees without pain    Baseline  4/30: R 50 L 34 Rotation, R 26 L 35 sb, Extension 35 flex 35; 5/21: extension 46, SB R 41 L 40, rotation R 62 L 61,     Time  4    Period  Weeks    Status  Achieved      PT LONG TERM GOAL #8   Title  Patient will improve NDI to <28% to reduce disability to mild  percieved disability for improved quality of life.     Baseline  4/30: 56% severe disability 5/21: 10%    Time  4    Period  Weeks    Status  Achieved      PT LONG TERM GOAL  #9   TITLE  Patient will report a worst pain of 3/10 on VAS in  cervical spine  to improve tolerance with ADLs and reduced symptoms with activities.     Baseline  4/30: 9/10 5/21: 0/10 in neck     Time  4    Period  Weeks    Status  Achieved      PT LONG TERM GOAL  #10   TITLE  Pt's 5xSTS will impove to equal to or below 50 seconds to demonstrate improved  balance and BLE strength    Baseline  5/15: 1 min 30.9 seconds 7/10: 64 seconds  8/20: 1 min 29 seconds with L foot frequently slipping out requiring patient to perform multiple attempts per one stand    Time  8    Period  Weeks    Status  Partially Met    Target Date  02/19/18      PT LONG TERM GOAL  #11   TITLE  Patient will descend/ascend down a handicap ramp  with RW and with supervision to increase independence in home environment.     Baseline  7/10 can ascend, unable to descend ; 8/20: achieved on 8/31    Time  6    Period  Weeks    Status  Achieved      PT LONG TERM GOAL  #12   TITLE  Patient will negotiate one step ~4 inches to enter house independently to increase functional independence in natural environment.     Baseline  8/20: unable to do     Time  8    Period  Weeks    Status  New    Target Date  02/19/18            Plan - 01/10/18 1256    Clinical Impression Statement   Patient presents with low back pain that began this morning. Pain reduced after manual and there ex to 3/10  by end of session. Patient challenged with abdomen activation for postural strengthening initially but improved with repetition. Patient will continue to benefit from skilled PT to improve mobility and independence     Rehab Potential  Good    PT Frequency  2x / week    PT Duration  8 weeks    PT Treatment/Interventions  ADLs/Self Care Home Management;Aquatic Therapy;Biofeedback;Cryotherapy;Electrical Stimulation;Iontophoresis 65m/ml Dexamethasone;Traction;Moist Heat;Ultrasound;DME Instruction;Gait training;Stair training;Functional mobility training;Therapeutic activities;Therapeutic exercise;Balance training;Neuromuscular re-education;Patient/family education;Orthotic Fit/Training;Wheelchair mobility training;Manual techniques;Compression bandaging;Passive range of motion;Dry needling;Energy conservation;Taping;Splinting    PT Next Visit Plan  assess need for new AFOs or other orthotic to  assist with ambulation, initiate strengthening program, practice and improve technique for bed mobility and sit<>stand    PT Home Exercise Plan  to initiate next session (please incorporate hip flexor and adductor stretches in HEP if possible -KG)    Consulted and Agree with Plan of Care  Patient       Patient will benefit from skilled therapeutic intervention in order to improve the following deficits and impairments:  Abnormal gait, Decreased activity tolerance, Decreased balance, Decreased coordination, Decreased endurance, Decreased knowledge of use of DME, Decreased mobility, Decreased range of motion, Decreased safety awareness, Decreased strength, Difficulty walking, Impaired perceived functional ability, Impaired sensation, Impaired tone, Impaired UE functional use, Improper body mechanics, Postural dysfunction  Visit Diagnosis: Muscle weakness (generalized)  Unsteadiness on feet  Other abnormalities of gait and mobility     Problem List Patient Active Problem List   Diagnosis Date Noted  . Wrist fracture 01/16/2017  . Health care maintenance 01/24/2016  . Blood pressure elevated without history of HTN 10/25/2015  . Multiple sclerosis ([place] 10/02/2015  . Chronic left shoulder pain 07/19/2015  . Multiple sclerosis exacerbation ([place] 07/14/2015  . MS (multiple sclerosis) ([place] 11/26/2014  . Increased body mass index 11/26/2014  . HPV test positive 11/26/2014  . Status post laparoscopic supracervical hysterectomy 11/26/2014  . Galactorrhea 11/26/2014  . Tobacco user 11/26/2014  . Back ache 05/21/2014  . Adiposity 05/21/2014  . Disordered sleep 05/21/2014  . Muscle spasticity 05/21/2014  . Calculus of kidney 12/09/2013  . Renal colic 050/53/9767 . Hypercholesteremia 08/19/2013  . Hereditary and idiopathic neuropathy 08/19/2013  . Hypercholesterolemia without hypertriglyceridemia 08/19/2013  . Bladder infection, chronic 07/25/2012  . Disorder of bladder function  07/25/2012  . Incomplete bladder emptying 07/25/2012  . Microscopic hematuria 07/25/2012   MJanna Arch PT, DPT   01/10/2018, 12:56 PM  [facility] [address] [address]Phone: [phone]  Fax:  [phone] Name: Lisa HeinleinRamseur MRN: [id number] of Birth: [birth year]

## 2018-01-12 DIAGNOSIS — G35 Multiple sclerosis: Secondary | ICD-10-CM | POA: Diagnosis not present

## 2018-01-15 ENCOUNTER — Ambulatory Visit: Payer: PPO

## 2018-01-15 DIAGNOSIS — R2689 Other abnormalities of gait and mobility: Secondary | ICD-10-CM

## 2018-01-15 DIAGNOSIS — M6281 Muscle weakness (generalized): Secondary | ICD-10-CM

## 2018-01-15 DIAGNOSIS — R2681 Unsteadiness on feet: Secondary | ICD-10-CM

## 2018-01-15 NOTE — Therapy (Signed)
[place] [facility] SERVICES [address] [place], [place], [address] Phone: [phone]   Fax:  [phone]  Physical Therapy Treatment  Patient Details  Name: Lisa Williams MRN: [id number] Date of Birth: October 29, 1960 Referring Provider: Gurney Maxin, MD   Encounter Date: 01/15/2018  PT End of Session - 01/15/18 1123    Visit Number  38    Number of Visits  49    Date for PT Re-Evaluation  02/19/18    Authorization Type  5/10 ( PN start 8/20)    PT Start Time  1115    PT Stop Time  1200    PT Time Calculation (min)  45 min    Equipment Utilized During Treatment  Gait belt    Activity Tolerance  Patient tolerated treatment well    Behavior During Therapy  [facility] for tasks assessed/performed       Past Medical History:  Diagnosis Date  . Abdominal pain, right upper quadrant   . Back pain   . Calculus of kidney 12/09/2013  . Chronic back pain    unspecified  . Chronic left shoulder pain 07/19/2015  . Functional disorder of bladder    other  . Galactorrhea 11/26/2014   Chronic   . Hereditary and idiopathic neuropathy 08/19/2013  . HPV test positive   . Hypercholesteremia 08/19/2013  . Incomplete bladder emptying   . Microscopic hematuria   . MS (multiple sclerosis) ([place])   . Muscle spasticity 05/21/2014  . Nonspecific findings on examination of urine    other  . Osteopenia   . Status post laparoscopic supracervical hysterectomy 11/26/2014  . Tobacco user 11/26/2014  . Wrist fracture     Past Surgical History:  Procedure Laterality Date  . bilateral tubal ligation  1996  . BREAST CYST EXCISION Left 2002  . KNEE SURGERY     right  . LAPAROSCOPIC SUPRACERVICAL HYSTERECTOMY  08/05/2013  . ORIF WRIST FRACTURE Left 01/17/2017   Procedure: OPEN REDUCTION INTERNAL FIXATION (ORIF) WRIST FRACTURE;  Surgeon: Lovell Sheehan, MD;  Location: [facility] ORS;  Service: Orthopedics;  Laterality: Left;  . TUBAL LIGATION Bilateral   . VAGINAL  HYSTERECTOMY  03/2006    There were no vitals filed for this visit.  Subjective Assessment - 01/15/18 1122    Subjective  Patient reports she is feeling much better today. Has been working on her HEP. Back pain no longer hurting. Was able to drive on the freeway. Was able to drive to therapy for session today.     Patient is accompained by:  Family member    Pertinent History  Pt is a 57 y/o F who presents with BLE weakness and difficulty ambulating due to h/o MS that was diagnosed in 1995. Pt denies any pain associated. Pt was admitted to the hospital in November 2018 due to MS exacerbation. Nov-Dec to HHPT-->[facility] until end of Jan. After rehab the pt was able to ambulate up to 100 ft with the RW. Pt has Bil AFO, her therapist at [facility] thinks she needs a new AFO as her feet still drag when she ambulates, pt reports HHPT mentioned a spring AFO. Pt has custom AFOs that were originally made 4 years ago. Goals: improve balance, to be able to get into the shower with her tub bench without assist, to be able to get up 2 steps at sister's home with railings on both sides but too far apart to reach both sides, to be able to drive  adapted car. Pt is staying at other sister's house with one step to get inside and currently picking up L leg with UEs and then stepping in. Pt steps down leading with LLE. Pt wants to work on walking side to side as this is challenging and she had not yet progressed to this at rehab. Pt able to get in/out the car with assist only to bring BLEs into the car. Hoping to drive by June or July with adapted car (already has one). Pt needs assist bringing LEs into and out of hospital bed. Pt able to perform sit>stand without assist from [facility] with a pillow on the seat for a boost. Pt denies any falls in the past 6 months. Pt has been increasing her time alone at home up to a few hours at a time to become more independent. Pt has both manual and power WC. Uses power WC at home, uses  manual WC out in community. Pt able to self propel manual WC. Pt needs assist putting pants around ankles but she can pull them up. Pt can put on her shoes but needs help getting her leg up to do so. Pt able to tie her shoelaces on her own. Pt has a tub/shower unit at home with a tub bench. No grab bars in the shower. Has hand held shower head. Pt currently taking a sponge bath. Pt has a regular height toilet with a BSC over top. Pt with h/o L wrist fx with no precautions, pt wore her soft brace to evaluation for comfort but reports her wrist has healed and is doing well following OT.     Limitations  Lifting;Standing;Walking;House hold activities    How long can you stand comfortably?  3 minutes without UE assist, limited by fatigue    How long can you walk comfortably?  100 ft, limited by fatigue    Patient Stated Goals  see above    Currently in Pain?  No/denies       Seated:   TrA activation 15x 5 second   Isometrics : adduction 2x10 5 second holds, abduction 2x10 5 second holds against PT hand.   Gluteal squeezes 10x 5 second holds  Weighted ball (2000 Gr) toss 10x forward,10x R, 10x L .   Weighted ball (2000gr)  straight arm raise 10x, rotation 10x   Seated Hip flexion 10x each leg to touch PT hand. Unable to clear leg.   Ambulation in // bars  x4lengths, Cues to avoid trunk flexion and to initiate step by moving pelvis forward.verbal cues for hip flexion and knee extension; improved step length and decreased foot drag   March 10x each leg BUE support;Improved clearance of R foot with cues for knee to sky;   Abduction 5x each leg BUE support, cues for keeping feet in line.   Ambulate backwards and forwards  in // bars 3 ft x 2 trials with BUE support.                          PT Education - 01/15/18 1123    Education provided  Yes    Education Details  exercise technique     Person(s) Educated  Patient    Methods   Explanation;Demonstration;Verbal cues    Comprehension  Verbalized understanding;Returned demonstration;Verbal cues required       PT Short Term Goals - 12/25/17 1155      PT SHORT TERM GOAL #1   Title  Pt will  be completed HEP with min assist from caregiver at least 4 days/wk for improved carryover between sessions    Baseline  HEP compliant     Time  2    Period  Weeks    Status  Partially Met      PT SHORT TERM GOAL #2   Title  Patient will report a worst pain of 6/10 on VAS in cervical spine to improve tolerance with ADLs and reduced symptoms with activities.     Baseline  4/30: 9/10 5/21: 0/10 pain    Time  2    Period  Weeks    Status  Achieved      PT SHORT TERM GOAL #3   Title  Patient will perform cervical rotation at least 50 degrees, flexion and extension to 70 degrees, and sidebending to 40 degrees without pain    Baseline  4/30: R 50 L 34 Rotation, R 26 L 35 sb, Extension 35 flex 35 ; 5/21: extension 46, SB R 41 L 40, rotation R 62 L 61,     Time  2    Period  Weeks    Status  Achieved        PT Long Term Goals - 12/25/17 1104      PT LONG TERM GOAL #1   Title  Pt's 5xSTS will impove to equal to or below 2 minutes to demonstrate improved balance and BLE strength    Baseline  3.02 minutes; 5/15: 1 min 30 seconds    Time  6    Period  Weeks    Status  Achieved      PT LONG TERM GOAL #2   Title  Pt's 22mT will improve to at least 0.5 m/s for improved safety ambulating in home    Baseline  0.14 m/s; 5/15: .272m  7/10: .36 m/s  8/20: .42 m/s     Time  8    Period  Weeks    Status  Partially Met    Target Date  02/19/18      PT LONG TERM GOAL #3   Title  Pt's Bil hip F strength will improve to at least 2+/5 BLEs for improved gait mechanics and safety    Baseline  L: 2/5, R: 1/5 7/10: 2/5 bilaterally  8/20: 2/5 bilat    Time  8    Period  Weeks    Status  Partially Met    Target Date  02/19/18      PT LONG TERM GOAL #4   Title  Pt's ABC Scale will  improve to at least 40% to demonstrate improved balance    Baseline  28.13% 5/15; 24.4% 7/10: 23.4% 8/20: 22. 2%    Time  6    Period  Weeks    Status  Partially Met    Target Date  02/19/18      PT LONG TERM GOAL #5   Title  Pt will be able to perform car transfer without assist to work towards pt's goal of returning to driving    Baseline  only needs help with one leg to put onto running board to get into sisters car; 8/20: in and out of own car independently.     Time  6    Period  Weeks    Status  Achieved      Additional Long Term Goals   Additional Long Term Goals  Yes      PT LONG TERM GOAL #6   Title  Pt will be able to perform tub bench into/out of shower transfer without assist for improved independence with this task    Baseline  challenged with steps at this time, getting better, stands and gives sponge bath independently now     Time  6    Period  Weeks    Status  Partially Met      PT LONG TERM GOAL #7   Title  Patient will perform cervical rotation at least 60 degrees, flexion and extension to 70 degrees, and sidebending to 40 degrees without pain    Baseline  4/30: R 50 L 34 Rotation, R 26 L 35 sb, Extension 35 flex 35; 5/21: extension 46, SB R 41 L 40, rotation R 62 L 61,     Time  4    Period  Weeks    Status  Achieved      PT LONG TERM GOAL #8   Title  Patient will improve NDI to <28% to reduce disability to mild  percieved disability for improved quality of life.     Baseline  4/30: 56% severe disability 5/21: 10%    Time  4    Period  Weeks    Status  Achieved      PT LONG TERM GOAL  #9   TITLE  Patient will report a worst pain of 3/10 on VAS in  cervical spine  to improve tolerance with ADLs and reduced symptoms with activities.     Baseline  4/30: 9/10 5/21: 0/10 in neck     Time  4    Period  Weeks    Status  Achieved      PT LONG TERM GOAL  #10   TITLE  Pt's 5xSTS will impove to equal to or below 50 seconds to demonstrate improved balance and BLE  strength    Baseline  5/15: 1 min 30.9 seconds 7/10: 64 seconds  8/20: 1 min 29 seconds with L foot frequently slipping out requiring patient to perform multiple attempts per one stand    Time  8    Period  Weeks    Status  Partially Met    Target Date  02/19/18      PT LONG TERM GOAL  #11   TITLE  Patient will descend/ascend down a handicap ramp  with RW and with supervision to increase independence in home environment.     Baseline  7/10 can ascend, unable to descend ; 8/20: achieved on 8/31    Time  6    Period  Weeks    Status  Achieved      PT LONG TERM GOAL  #12   TITLE  Patient will negotiate one step ~4 inches to enter house independently to increase functional independence in natural environment.     Baseline  8/20: unable to do     Time  8    Period  Weeks    Status  New    Target Date  02/19/18            Plan - 01/15/18 1133    Clinical Impression Statement  Patient demonstrates good clearance of LE's with ambulation, however upon fatigue return to shuffle step noted. Patient challenged with fatigue requiring use of ice pack to return core temperature to resting temperature. Patient will continue to benefit from skilled PT to improve mobility and independence      Rehab Potential  Good    PT Frequency  2x / week  PT Duration  8 weeks    PT Treatment/Interventions  ADLs/Self Care Home Management;Aquatic Therapy;Biofeedback;Cryotherapy;Electrical Stimulation;Iontophoresis 10m/ml Dexamethasone;Traction;Moist Heat;Ultrasound;DME Instruction;Gait training;Stair training;Functional mobility training;Therapeutic activities;Therapeutic exercise;Balance training;Neuromuscular re-education;Patient/family education;Orthotic Fit/Training;Wheelchair mobility training;Manual techniques;Compression bandaging;Passive range of motion;Dry needling;Energy conservation;Taping;Splinting    PT Next Visit Plan  assess need for new AFOs or other orthotic to assist with ambulation, initiate  strengthening program, practice and improve technique for bed mobility and sit<>stand    PT Home Exercise Plan  to initiate next session (please incorporate hip flexor and adductor stretches in HEP if possible -KG)    Consulted and Agree with Plan of Care  Patient       Patient will benefit from skilled therapeutic intervention in order to improve the following deficits and impairments:  Abnormal gait, Decreased activity tolerance, Decreased balance, Decreased coordination, Decreased endurance, Decreased knowledge of use of DME, Decreased mobility, Decreased range of motion, Decreased safety awareness, Decreased strength, Difficulty walking, Impaired perceived functional ability, Impaired sensation, Impaired tone, Impaired UE functional use, Improper body mechanics, Postural dysfunction  Visit Diagnosis: Muscle weakness (generalized)  Unsteadiness on feet  Other abnormalities of gait and mobility     Problem List Patient Active Problem List   Diagnosis Date Noted  . Wrist fracture 01/16/2017  . Health care maintenance 01/24/2016  . Blood pressure elevated without history of HTN 10/25/2015  . Multiple sclerosis ([place] 10/02/2015  . Chronic left shoulder pain 07/19/2015  . [place]ation ([place] 07/14/2015  . MS (multiple sclerosis) ([place] 11/26/2014  . Increased body mass index 11/26/2014  . HPV test positive 11/26/2014  . Status post laparoscopic supracervical hysterectomy 11/26/2014  . Galactorrhea 11/26/2014  . Tobacco user 11/26/2014  . Back ache 05/21/2014  . Adiposity 05/21/2014  . Disordered sleep 05/21/2014  . Muscle spasticity 05/21/2014  . Calculus of kidney 12/09/2013  . Renal colic 054/65/0354 . Hypercholesteremia 08/19/2013  . Hereditary and idiopathic neuropathy 08/19/2013  . Hypercholesterolemia without hypertriglyceridemia 08/19/2013  . Bladder infection, chronic 07/25/2012  . Disorder of bladder function 07/25/2012  . Incomplete bladder  emptying 07/25/2012  . Microscopic hematuria 07/25/2012   MJanna Arch PT, DPT   01/15/2018, 12:05 PM  [place] [address] [address]Phone: [phone]  Fax:  [phone] Name: Lisa Williams MRN: [id number] of Birth: 6March 16, 1962

## 2018-01-16 DIAGNOSIS — M21371 Foot drop, right foot: Secondary | ICD-10-CM | POA: Diagnosis not present

## 2018-01-16 DIAGNOSIS — M21372 Foot drop, left foot: Secondary | ICD-10-CM | POA: Diagnosis not present

## 2018-01-16 DIAGNOSIS — G35 Multiple sclerosis: Secondary | ICD-10-CM | POA: Diagnosis not present

## 2018-01-17 ENCOUNTER — Ambulatory Visit: Payer: PPO

## 2018-01-17 DIAGNOSIS — M6281 Muscle weakness (generalized): Secondary | ICD-10-CM

## 2018-01-17 DIAGNOSIS — R2681 Unsteadiness on feet: Secondary | ICD-10-CM

## 2018-01-17 DIAGNOSIS — R2689 Other abnormalities of gait and mobility: Secondary | ICD-10-CM

## 2018-01-17 NOTE — Therapy (Signed)
[place] MAIN [facility] SERVICES [address] [place], [place], [address] Phone: [phone]   Fax:  [phone]  Physical Therapy Treatment  Patient Details  Name: Lisa Williams MRN: [id number] Date of Birth: [birth year] Referring Provider: Gurney Maxin, MD   Encounter Date: 01/17/2018  PT End of Session - 01/17/18 1145    Visit Number  39    Number of Visits  49    Date for PT Re-Evaluation  02/19/18    Authorization Type  6/10 ( PN start 8/20)    PT Start Time  1114    PT Stop Time  1158    PT Time Calculation (min)  44 min    Equipment Utilized During Treatment  Gait belt    Activity Tolerance  Patient tolerated treatment well    Behavior During Therapy  [facility] for tasks assessed/performed       Past Medical History:  Diagnosis Date  . Abdominal pain, right upper quadrant   . Back pain   . Calculus of kidney 12/09/2013  . Chronic back pain    unspecified  . Chronic left shoulder pain 07/19/2015  . Functional disorder of bladder    other  . Galactorrhea 11/26/2014   Chronic   . Hereditary and idiopathic neuropathy 08/19/2013  . HPV test positive   . Hypercholesteremia 08/19/2013  . Incomplete bladder emptying   . Microscopic hematuria   . MS (multiple sclerosis) ([place])   . Muscle spasticity 05/21/2014  . Nonspecific findings on examination of urine    other  . Osteopenia   . Status post laparoscopic supracervical hysterectomy 11/26/2014  . Tobacco user 11/26/2014  . Wrist fracture     Past Surgical History:  Procedure Laterality Date  . bilateral tubal ligation  1996  . BREAST CYST EXCISION Left 2002  . KNEE SURGERY     right  . LAPAROSCOPIC SUPRACERVICAL HYSTERECTOMY  08/05/2013  . ORIF WRIST FRACTURE Left 01/17/2017   Procedure: OPEN REDUCTION INTERNAL FIXATION (ORIF) WRIST FRACTURE;  Surgeon: Lovell Sheehan, MD;  Location: [facility] ORS;  Service: Orthopedics;  Laterality: Left;  . TUBAL LIGATION Bilateral   . VAGINAL  HYSTERECTOMY  03/2006    There were no vitals filed for this visit.  Subjective Assessment - 01/17/18 1124    Subjective  Patient has went to the orthotist and recieved her new  AFO's. Is nervous about wearing them because they don't feel as stable yet/are new feeling.     Patient is accompained by:  Family member    Pertinent History  Pt is a 57 y/o F who presents with BLE weakness and difficulty ambulating due to h/o MS that was diagnosed in 1995. Pt denies any pain associated. Pt was admitted to the hospital in November 2018 due to MS exacerbation. Nov-Dec to HHPT-->[facility] until end of Jan. After rehab the pt was able to ambulate up to 100 ft with the RW. Pt has Bil AFO, her therapist at [facility] thinks she needs a new AFO as her feet still drag when she ambulates, pt reports HHPT mentioned a spring AFO. Pt has custom AFOs that were originally made 4 years ago. Goals: improve balance, to be able to get into the shower with her tub bench without assist, to be able to get up 2 steps at sister's home with railings on both sides but too far apart to reach both sides, to be able to drive adapted car. Pt is staying at other sister's house  with one step to get inside and currently picking up L leg with UEs and then stepping in. Pt steps down leading with LLE. Pt wants to work on walking side to side as this is challenging and she had not yet progressed to this at rehab. Pt able to get in/out the car with assist only to bring BLEs into the car. Hoping to drive by June or July with adapted car (already has one). Pt needs assist bringing LEs into and out of hospital bed. Pt able to perform sit>stand without assist from [facility] with a pillow on the seat for a boost. Pt denies any falls in the past 6 months. Pt has been increasing her time alone at home up to a few hours at a time to become more independent. Pt has both manual and power WC. Uses power WC at home, uses manual WC out in community. Pt able to self  propel manual WC. Pt needs assist putting pants around ankles but she can pull them up. Pt can put on her shoes but needs help getting her leg up to do so. Pt able to tie her shoelaces on her own. Pt has a tub/shower unit at home with a tub bench. No grab bars in the shower. Has hand held shower head. Pt currently taking a sponge bath. Pt has a regular height toilet with a BSC over top. Pt with h/o L wrist fx with no precautions, pt wore her soft brace to evaluation for comfort but reports her wrist has healed and is doing well following OT.     Limitations  Lifting;Standing;Walking;House hold activities    How long can you stand comfortably?  3 minutes without UE assist, limited by fatigue    How long can you walk comfortably?  100 ft, limited by fatigue    Patient Stated Goals  see above    Currently in Pain?  No/denies       Ambulate 75 ft with RW and CGA with w/c follow and wearing old AFOs. verbal cueing for weight shift to offload limb in swing phase to allow improved step length. Patient verbal cueing of " shift and lift" to shift weight and lift knee to improve gait mechanics  Wearing new AFOs: Standing weight shift in // bars 2 minutes CGA BUE support  Standing marches 10x each leg in // bars. Decreased hip flexion of RLE with fatigue  Ambulate forwards and backwards x1 each direction with CGA x2 people. Improved step length of RLE when AFO top strap tightened.   Seated: Marches 10x each leg; cues for muscle activation  Adductor isometric squeeze 10x 3 second holds; Cues to maintain muscle contraction.   Abductor isometric squeeze 10x 3 second holds.   Weighted ball (3000 Gr) straight arm raised. 10x. good body mechanics, not having back against chair for abdominal activation  Weighted ball (3000 Gr) horizontal twists 10xgood body mechanics, not having back against chair for abdominal activation  Weighted ball (3000 Gr) tosses 10x each direction (L and R) ; good body mechanics,  not having back against chair for abdominal activation   Seated gluteal squeezes 15x 3 second holds  Seated balloon taps. 3 minutes reaching inside and outside BOS for core and postural righting reactions  Reach outside BOS to grab cones and place them laterally 4 minutes  Donning/doffing new AFOs.   Use of ice pack between interventions to decrease body temperature  PT Education - 01/17/18 1140    Education provided  Yes    Education Details  exercise technique     Person(s) Educated  Patient    Methods  Explanation;Demonstration;Verbal cues    Comprehension  Verbalized understanding;Returned demonstration;Verbal cues required       PT Short Term Goals - 12/25/17 1155      PT SHORT TERM GOAL #1   Title  Pt will be completed HEP with min assist from caregiver at least 4 days/wk for improved carryover between sessions    Baseline  HEP compliant     Time  2    Period  Weeks    Status  Partially Met      PT SHORT TERM GOAL #2   Title  Patient will report a worst pain of 6/10 on VAS in cervical spine to improve tolerance with ADLs and reduced symptoms with activities.     Baseline  4/30: 9/10 5/21: 0/10 pain    Time  2    Period  Weeks    Status  Achieved      PT SHORT TERM GOAL #3   Title  Patient will perform cervical rotation at least 50 degrees, flexion and extension to 70 degrees, and sidebending to 40 degrees without pain    Baseline  4/30: R 50 L 34 Rotation, R 26 L 35 sb, Extension 35 flex 35 ; 5/21: extension 46, SB R 41 L 40, rotation R 62 L 61,     Time  2    Period  Weeks    Status  Achieved        PT Long Term Goals - 12/25/17 1104      PT LONG TERM GOAL #1   Title  Pt's 5xSTS will impove to equal to or below 2 minutes to demonstrate improved balance and BLE strength    Baseline  3.02 minutes; 5/15: 1 min 30 seconds    Time  6    Period  Weeks    Status  Achieved      PT LONG TERM GOAL #2   Title  Pt's 12mT will  improve to at least 0.5 m/s for improved safety ambulating in home    Baseline  0.14 m/s; 5/15: .223m  7/10: .36 m/s  8/20: .42 m/s     Time  8    Period  Weeks    Status  Partially Met    Target Date  02/19/18      PT LONG TERM GOAL #3   Title  Pt's Bil hip F strength will improve to at least 2+/5 BLEs for improved gait mechanics and safety    Baseline  L: 2/5, R: 1/5 7/10: 2/5 bilaterally  8/20: 2/5 bilat    Time  8    Period  Weeks    Status  Partially Met    Target Date  02/19/18      PT LONG TERM GOAL #4   Title  Pt's ABC Scale will improve to at least 40% to demonstrate improved balance    Baseline  28.13% 5/15; 24.4% 7/10: 23.4% 8/20: 22. 2%    Time  6    Period  Weeks    Status  Partially Met    Target Date  02/19/18      PT LONG TERM GOAL #5   Title  Pt will be able to perform car transfer without assist to work towards pt's goal of returning to driving    Baseline  only needs help with one leg to put onto running board to get into sisters car; 8/20: in and out of own car independently.     Time  6    Period  Weeks    Status  Achieved      Additional Long Term Goals   Additional Long Term Goals  Yes      PT LONG TERM GOAL #6   Title  Pt will be able to perform tub bench into/out of shower transfer without assist for improved independence with this task    Baseline  challenged with steps at this time, getting better, stands and gives sponge bath independently now     Time  6    Period  Weeks    Status  Partially Met      PT LONG TERM GOAL #7   Title  Patient will perform cervical rotation at least 60 degrees, flexion and extension to 70 degrees, and sidebending to 40 degrees without pain    Baseline  4/30: R 50 L 34 Rotation, R 26 L 35 sb, Extension 35 flex 35; 5/21: extension 46, SB R 41 L 40, rotation R 62 L 61,     Time  4    Period  Weeks    Status  Achieved      PT LONG TERM GOAL #8   Title  Patient will improve NDI to <28% to reduce disability to mild   percieved disability for improved quality of life.     Baseline  4/30: 56% severe disability 5/21: 10%    Time  4    Period  Weeks    Status  Achieved      PT LONG TERM GOAL  #9   TITLE  Patient will report a worst pain of 3/10 on VAS in  cervical spine  to improve tolerance with ADLs and reduced symptoms with activities.     Baseline  4/30: 9/10 5/21: 0/10 in neck     Time  4    Period  Weeks    Status  Achieved      PT LONG TERM GOAL  #10   TITLE  Pt's 5xSTS will impove to equal to or below 50 seconds to demonstrate improved balance and BLE strength    Baseline  5/15: 1 min 30.9 seconds 7/10: 64 seconds  8/20: 1 min 29 seconds with L foot frequently slipping out requiring patient to perform multiple attempts per one stand    Time  8    Period  Weeks    Status  Partially Met    Target Date  02/19/18      PT LONG TERM GOAL  #11   TITLE  Patient will descend/ascend down a handicap ramp  with RW and with supervision to increase independence in home environment.     Baseline  7/10 can ascend, unable to descend ; 8/20: achieved on 8/31    Time  6    Period  Weeks    Status  Achieved      PT LONG TERM GOAL  #12   TITLE  Patient will negotiate one step ~4 inches to enter house independently to increase functional independence in natural environment.     Baseline  8/20: unable to do     Time  8    Period  Weeks    Status  New    Target Date  02/19/18            Plan -  01/17/18 1153    Clinical Impression Statement  Patient continues to progress with ambulation in hallway and RW with wheelchair follow. Patient presents with new AFOs and is slightly fearful of wearing them with feelings of unsteadiness in standing position. Patient confidence improved with prolonged wear and wearing pattern educated with schedule verbalized and agreed upon. Patient will continue to benefit from skilled PT to improve mobility and independence      Rehab Potential  Good    PT Frequency  2x / week     PT Duration  8 weeks    PT Treatment/Interventions  ADLs/Self Care Home Management;Aquatic Therapy;Biofeedback;Cryotherapy;Electrical Stimulation;Iontophoresis 45m/ml Dexamethasone;Traction;Moist Heat;Ultrasound;DME Instruction;Gait training;Stair training;Functional mobility training;Therapeutic activities;Therapeutic exercise;Balance training;Neuromuscular re-education;Patient/family education;Orthotic Fit/Training;Wheelchair mobility training;Manual techniques;Compression bandaging;Passive range of motion;Dry needling;Energy conservation;Taping;Splinting    PT Next Visit Plan  assess need for new AFOs or other orthotic to assist with ambulation, initiate strengthening program, practice and improve technique for bed mobility and sit<>stand    PT Home Exercise Plan  to initiate next session (please incorporate hip flexor and adductor stretches in HEP if possible -KG)    Consulted and Agree with Plan of Care  Patient       Patient will benefit from skilled therapeutic intervention in order to improve the following deficits and impairments:  Abnormal gait, Decreased activity tolerance, Decreased balance, Decreased coordination, Decreased endurance, Decreased knowledge of use of DME, Decreased mobility, Decreased range of motion, Decreased safety awareness, Decreased strength, Difficulty walking, Impaired perceived functional ability, Impaired sensation, Impaired tone, Impaired UE functional use, Improper body mechanics, Postural dysfunction  Visit Diagnosis: Muscle weakness (generalized)  Unsteadiness on feet  Other abnormalities of gait and mobility     Problem List Patient Active Problem List   Diagnosis Date Noted  . Wrist fracture 01/16/2017  . Health care maintenance 01/24/2016  . Blood pressure elevated without history of HTN 10/25/2015  . Multiple sclerosis ([place] 10/02/2015  . Chronic left shoulder pain 07/19/2015  . [place]ation ([place] 07/14/2015  . MS  [place]) [place] 11/26/2014  . Increased body mass index 11/26/2014  . HPV test positive 11/26/2014  . Status post laparoscopic supracervical hysterectomy 11/26/2014  . Galactorrhea 11/26/2014  . Tobacco user 11/26/2014  . Back ache 05/21/2014  . Adiposity 05/21/2014  . Disordered sleep 05/21/2014  . Muscle spasticity 05/21/2014  . Calculus of kidney 12/09/2013  . Renal colic 097/58/8325 . Hypercholesteremia 08/19/2013  . Hereditary and idiopathic neuropathy 08/19/2013  . Hypercholesterolemia without hypertriglyceridemia 08/19/2013  . Bladder infection, chronic 07/25/2012  . Disorder of bladder function 07/25/2012  . Incomplete bladder emptying 07/25/2012  . Microscopic hematuria 07/25/2012   AWomen'S Hospital At RenaissanceMJanna Arch PT, DPT   01/17/2018, 12:06 PM  [place] [facility] [address] [address]Phone: [phone]  Fax:  [phone] Name: Lisa GuamanRamseur MRN: [id number] of Birth: 619-Sep-1962

## 2018-01-22 ENCOUNTER — Ambulatory Visit: Payer: PPO

## 2018-01-22 VITALS — BP 155/78 | HR 63

## 2018-01-22 DIAGNOSIS — R2689 Other abnormalities of gait and mobility: Secondary | ICD-10-CM

## 2018-01-22 DIAGNOSIS — R2681 Unsteadiness on feet: Secondary | ICD-10-CM

## 2018-01-22 DIAGNOSIS — M6281 Muscle weakness (generalized): Secondary | ICD-10-CM | POA: Diagnosis not present

## 2018-01-22 NOTE — Therapy (Addendum)
[place] MAIN [facility] SERVICES [address] [place], [place], [address] Phone: [phone]   Fax:  [phone]  Physical Therapy Treatment  Patient Details  Name: Lisa Williams MRN: [id number] Date of Birth: March 27, 1961 Referring Provider: Gurney Maxin, MD   Encounter Date: 01/22/2018  PT End of Session - 01/22/18 1722    Visit Number  40    Number of Visits  49    Date for PT Re-Evaluation  02/19/18    Authorization Type  7/10 ( PN start 8/20)    PT Start Time  1258    PT Stop Time  1343    PT Time Calculation (min)  45 min    Equipment Utilized During Treatment  Gait belt    Activity Tolerance  Patient limited by fatigue;Patient tolerated treatment well    Behavior During Therapy  [facility] for tasks assessed/performed       Past Medical History:  Diagnosis Date  . Abdominal pain, right upper quadrant   . Back pain   . Calculus of kidney 12/09/2013  . Chronic back pain    unspecified  . Chronic left shoulder pain 07/19/2015  . Functional disorder of bladder    other  . Galactorrhea 11/26/2014   Chronic   . Hereditary and idiopathic neuropathy 08/19/2013  . HPV test positive   . Hypercholesteremia 08/19/2013  . Incomplete bladder emptying   . Microscopic hematuria   . MS (multiple sclerosis) ([place])   . Muscle spasticity 05/21/2014  . Nonspecific findings on examination of urine    other  . Osteopenia   . Status post laparoscopic supracervical hysterectomy 11/26/2014  . Tobacco user 11/26/2014  . Wrist fracture     Past Surgical History:  Procedure Laterality Date  . bilateral tubal ligation  1996  . BREAST CYST EXCISION Left 2002  . KNEE SURGERY     right  . LAPAROSCOPIC SUPRACERVICAL HYSTERECTOMY  08/05/2013  . ORIF WRIST FRACTURE Left 01/17/2017   Procedure: OPEN REDUCTION INTERNAL FIXATION (ORIF) WRIST FRACTURE;  Surgeon: Lovell Sheehan, MD;  Location: [facility] ORS;  Service: Orthopedics;  Laterality: Left;  . TUBAL  LIGATION Bilateral   . VAGINAL HYSTERECTOMY  03/2006    Vitals:   01/22/18 1315  BP: (!) 155/78  Pulse: 63    Subjective Assessment - 01/22/18 1319    Subjective  Patient reports she woke up with stomach bug. She did not bring her new AFOs today. Reports she still does not feel comfortable in new AFOs but she did wear them for 3hours yesterday.    Pertinent History  Pt is a 57 y/o F who presents with BLE weakness and difficulty ambulating due to h/o MS that was diagnosed in 1995. Pt denies any pain associated. Pt was admitted to the hospital in November 2018 due to MS exacerbation. Nov-Dec to HHPT-->[facility] until end of Jan. After rehab the pt was able to ambulate up to 100 ft with the RW. Pt has Bil AFO, her therapist at [place] thinks she needs a new AFO as her feet still drag when she ambulates, pt reports HHPT mentioned a spring AFO. Pt has custom AFOs that were originally made 4 years ago. Goals: improve balance, to be able to get into the shower with her tub bench without assist, to be able to get up 2 steps at sister's home with railings on both sides but too far apart to reach both sides, to be able to drive adapted car. Pt  is staying at other sister's house with one step to get inside and currently picking up L leg with UEs and then stepping in. Pt steps down leading with LLE. Pt wants to work on walking side to side as this is challenging and she had not yet progressed to this at rehab. Pt able to get in/out the car with assist only to bring BLEs into the car. Hoping to drive by June or July with adapted car (already has one). Pt needs assist bringing LEs into and out of hospital bed. Pt able to perform sit>stand without assist from [facility] with a pillow on the seat for a boost. Pt denies any falls in the past 6 months. Pt has been increasing her time alone at home up to a few hours at a time to become more independent. Pt has both manual and power WC. Uses power WC at home, uses manual WC  out in community. Pt able to self propel manual WC. Pt needs assist putting pants around ankles but she can pull them up. Pt can put on her shoes but needs help getting her leg up to do so. Pt able to tie her shoelaces on her own. Pt has a tub/shower unit at home with a tub bench. No grab bars in the shower. Has hand held shower head. Pt currently taking a sponge bath. Pt has a regular height toilet with a BSC over top. Pt with h/o L wrist fx with no precautions, pt wore her soft brace to evaluation for comfort but reports her wrist has healed and is doing well following OT.     Limitations  Lifting;Standing;Walking;House hold activities    How long can you sit comfortably?  n/a    How long can you stand comfortably?  3 minutes without UE assist, limited by fatigue    How long can you walk comfortably?  100 ft, limited by fatigue    Currently in Pain?  No/denies        Standing weight shift in // bars 2 minutes CGA BUE support  -standing terminated due to change of color, fatigue, and por tolerance   Seated: Marches 2x10 each leg; verbal and tactile cues for muscle activation   Adductor isometric ball squeeze 2x10 5 second holds; Cues to maintain muscle contraction.    Abductor isometric squeeze 10x 5 second holds. Visual and tactile cues for muscle activation  Gluteal squeezes x15 5 second holds    Weighted ball (3000 Gr) straight arm raised. 10x. good body mechanics, not having back against chair for abdominal activation   Weighted ball (3000 Gr) horizontal twists 10xgood body mechanics, not having back against chair for abdominal activation   Weighted ball (3000 Gr) tosses 10x each direction (L and R) ; good body mechanics, not having back against chair for abdominal activation  Seated balloon taps. 3 minutes reaching inside and outside BOS for core and postural righting reactions   Shooting basketball seated without back support with verbal cueing for abdominal activation and seated  stability pertubation's with coordination challenges. 38mnutes                       PT Education - 01/22/18 1636    Education provided  Yes    Education Details  exercise technique, AFO wear    Person(s) Educated  Patient    Methods  Explanation;Demonstration;Verbal cues    Comprehension  Verbalized understanding;Returned demonstration;Verbal cues required       PT Short  Term Goals - 12/25/17 1155      PT SHORT TERM GOAL #1   Title  Pt will be completed HEP with min assist from caregiver at least 4 days/wk for improved carryover between sessions    Baseline  HEP compliant     Time  2    Period  Weeks    Status  Partially Met      PT SHORT TERM GOAL #2   Title  Patient will report a worst pain of 6/10 on VAS in cervical spine to improve tolerance with ADLs and reduced symptoms with activities.     Baseline  4/30: 9/10 5/21: 0/10 pain    Time  2    Period  Weeks    Status  Achieved      PT SHORT TERM GOAL #3   Title  Patient will perform cervical rotation at least 50 degrees, flexion and extension to 70 degrees, and sidebending to 40 degrees without pain    Baseline  4/30: R 50 L 34 Rotation, R 26 L 35 sb, Extension 35 flex 35 ; 5/21: extension 46, SB R 41 L 40, rotation R 62 L 61,     Time  2    Period  Weeks    Status  Achieved        PT Long Term Goals - 12/25/17 1104      PT LONG TERM GOAL #1   Title  Pt's 5xSTS will impove to equal to or below 2 minutes to demonstrate improved balance and BLE strength    Baseline  3.02 minutes; 5/15: 1 min 30 seconds    Time  6    Period  Weeks    Status  Achieved      PT LONG TERM GOAL #2   Title  Pt's 10mT will improve to at least 0.5 m/s for improved safety ambulating in home    Baseline  0.14 m/s; 5/15: .258m  7/10: .36 m/s  8/20: .42 m/s     Time  8    Period  Weeks    Status  Partially Met    Target Date  02/19/18      PT LONG TERM GOAL #3   Title  Pt's Bil hip F strength will improve to at least  2+/5 BLEs for improved gait mechanics and safety    Baseline  L: 2/5, R: 1/5 7/10: 2/5 bilaterally  8/20: 2/5 bilat    Time  8    Period  Weeks    Status  Partially Met    Target Date  02/19/18      PT LONG TERM GOAL #4   Title  Pt's ABC Scale will improve to at least 40% to demonstrate improved balance    Baseline  28.13% 5/15; 24.4% 7/10: 23.4% 8/20: 22. 2%    Time  6    Period  Weeks    Status  Partially Met    Target Date  02/19/18      PT LONG TERM GOAL #5   Title  Pt will be able to perform car transfer without assist to work towards pt's goal of returning to driving    Baseline  only needs help with one leg to put onto running board to get into sisters car; 8/20: in and out of own car independently.     Time  6    Period  Weeks    Status  Achieved      Additional Long Term Goals  Additional Long Term Goals  Yes      PT LONG TERM GOAL #6   Title  Pt will be able to perform tub bench into/out of shower transfer without assist for improved independence with this task    Baseline  challenged with steps at this time, getting better, stands and gives sponge bath independently now     Time  6    Period  Weeks    Status  Partially Met      PT LONG TERM GOAL #7   Title  Patient will perform cervical rotation at least 60 degrees, flexion and extension to 70 degrees, and sidebending to 40 degrees without pain    Baseline  4/30: R 50 L 34 Rotation, R 26 L 35 sb, Extension 35 flex 35; 5/21: extension 46, SB R 41 L 40, rotation R 62 L 61,     Time  4    Period  Weeks    Status  Achieved      PT LONG TERM GOAL #8   Title  Patient will improve NDI to <28% to reduce disability to mild  percieved disability for improved quality of life.     Baseline  4/30: 56% severe disability 5/21: 10%    Time  4    Period  Weeks    Status  Achieved      PT LONG TERM GOAL  #9   TITLE  Patient will report a worst pain of 3/10 on VAS in  cervical spine  to improve tolerance with ADLs and reduced  symptoms with activities.     Baseline  4/30: 9/10 5/21: 0/10 in neck     Time  4    Period  Weeks    Status  Achieved      PT LONG TERM GOAL  #10   TITLE  Pt's 5xSTS will impove to equal to or below 50 seconds to demonstrate improved balance and BLE strength    Baseline  5/15: 1 min 30.9 seconds 7/10: 64 seconds  8/20: 1 min 29 seconds with L foot frequently slipping out requiring patient to perform multiple attempts per one stand    Time  8    Period  Weeks    Status  Partially Met    Target Date  02/19/18      PT LONG TERM GOAL  #11   TITLE  Patient will descend/ascend down a handicap ramp  with RW and with supervision to increase independence in home environment.     Baseline  7/10 can ascend, unable to descend ; 8/20: achieved on 8/31    Time  6    Period  Weeks    Status  Achieved      PT LONG TERM GOAL  #12   TITLE  Patient will negotiate one step ~4 inches to enter house independently to increase functional independence in natural environment.     Baseline  8/20: unable to do     Time  8    Period  Weeks    Status  New    Target Date  02/19/18            Plan - 01/22/18 1644    Clinical Impression Statement  Patient had increased fatigue and difficulty with standing exercises today likely due to waking up with stomach bug. Patient required increased assistance and difficulty with standing from wheel chair and standing exercise was terminated because of patient not feeling well. Session focused on seated  exercises due to patient symptoms and slight increase in patient blood pressure. Patient will continue to benefit from skilled physical therapy services to improve mobility and independence.     Rehab Potential  Good    PT Frequency  2x / week    PT Duration  8 weeks    PT Treatment/Interventions  ADLs/Self Care Home Management;Aquatic Therapy;Biofeedback;Cryotherapy;Electrical Stimulation;Iontophoresis 63m/ml Dexamethasone;Traction;Moist Heat;Ultrasound;DME  Instruction;Gait training;Stair training;Functional mobility training;Therapeutic activities;Therapeutic exercise;Balance training;Neuromuscular re-education;Patient/family education;Orthotic Fit/Training;Wheelchair mobility training;Manual techniques;Compression bandaging;Passive range of motion;Dry needling;Energy conservation;Taping;Splinting    PT Next Visit Plan  standing exercises in new AFO    PT Home Exercise Plan  to initiate next session (please incorporate hip flexor and adductor stretches in HEP if possible -KG)    Consulted and Agree with Plan of Care  Patient       Patient will benefit from skilled therapeutic intervention in order to improve the following deficits and impairments:  Abnormal gait, Decreased activity tolerance, Decreased balance, Decreased coordination, Decreased endurance, Decreased knowledge of use of DME, Decreased mobility, Decreased range of motion, Decreased safety awareness, Decreased strength, Difficulty walking, Impaired perceived functional ability, Impaired sensation, Impaired tone, Impaired UE functional use, Improper body mechanics, Postural dysfunction  Visit Diagnosis: Muscle weakness (generalized)  Unsteadiness on feet  Other abnormalities of gait and mobility     Problem List Patient Active Problem List   Diagnosis Date Noted  . Wrist fracture 01/16/2017  . Health care maintenance 01/24/2016  . Blood pressure elevated without history of HTN 10/25/2015  . [place] ([place] 10/02/2015  . Chronic left shoulder pain 07/19/2015  . [place] 07/14/2015  . MS (multiple sclerosis) ([place] 11/26/2014  . Increased body mass index 11/26/2014  . HPV test positive 11/26/2014  . Status post laparoscopic supracervical hysterectomy 11/26/2014  . Galactorrhea 11/26/2014  . Tobacco user 11/26/2014  . Back ache 05/21/2014  . Adiposity 05/21/2014  . Disordered sleep 05/21/2014  . Muscle spasticity 05/21/2014  . Calculus  of kidney 12/09/2013  . Renal colic 076/22/6333 . Hypercholesteremia 08/19/2013  . Hereditary and idiopathic neuropathy 08/19/2013  . Hypercholesterolemia without hypertriglyceridemia 08/19/2013  . Bladder infection, chronic 07/25/2012  . Disorder of bladder function 07/25/2012  . Incomplete bladder emptying 07/25/2012  . Microscopic hematuria 07/25/2012   AErick Blinks SPT This entire session was performed under direct supervision and direction of a licensed therapist/therapist assistant . I have personally read, edited and approve of the note as written. MJanna Arch PT, DPT   01/23/2018, 8:07 AM  [place] [facility] [address] [address]Phone: [phone]  Fax:  [phone] Name: JAstha ProbascoRamseur MRN: [id number] of Birth: [birth year]

## 2018-01-24 ENCOUNTER — Ambulatory Visit: Payer: PPO

## 2018-01-24 DIAGNOSIS — M6281 Muscle weakness (generalized): Secondary | ICD-10-CM

## 2018-01-24 DIAGNOSIS — R2689 Other abnormalities of gait and mobility: Secondary | ICD-10-CM

## 2018-01-24 DIAGNOSIS — R2681 Unsteadiness on feet: Secondary | ICD-10-CM

## 2018-01-24 NOTE — Therapy (Signed)
[place] MAIN [facility] SERVICES [address] [place], [place], [address] Phone: [phone]   Fax:  [phone]  Physical Therapy Treatment  Patient Details  Name: Lisa Williams MRN: [id number] Date of Birth: Dec 30, 1960 Referring Provider: Gurney Maxin, MD   Encounter Date: 01/24/2018  PT End of Session - 01/24/18 1307    Visit Number  41    Number of Visits  49    Date for PT Re-Evaluation  02/19/18    Authorization Type  8/10 ( PN start 8/20)    PT Start Time  1347    PT Stop Time  1429    PT Time Calculation (min)  42 min    Equipment Utilized During Treatment  Gait belt    Activity Tolerance  Patient limited by fatigue;Patient tolerated treatment well    Behavior During Therapy  [facility] for tasks assessed/performed       Past Medical History:  Diagnosis Date  . Abdominal pain, right upper quadrant   . Back pain   . Calculus of kidney 12/09/2013  . Chronic back pain    unspecified  . Chronic left shoulder pain 07/19/2015  . Functional disorder of bladder    other  . Galactorrhea 11/26/2014   Chronic   . Hereditary and idiopathic neuropathy 08/19/2013  . HPV test positive   . Hypercholesteremia 08/19/2013  . Incomplete bladder emptying   . Microscopic hematuria   . MS (multiple sclerosis) ([place])   . Muscle spasticity 05/21/2014  . Nonspecific findings on examination of urine    other  . Osteopenia   . Status post laparoscopic supracervical hysterectomy 11/26/2014  . Tobacco user 11/26/2014  . Wrist fracture     Past Surgical History:  Procedure Laterality Date  . bilateral tubal ligation  1996  . BREAST CYST EXCISION Left 2002  . KNEE SURGERY     right  . LAPAROSCOPIC SUPRACERVICAL HYSTERECTOMY  08/05/2013  . ORIF WRIST FRACTURE Left 01/17/2017   Procedure: OPEN REDUCTION INTERNAL FIXATION (ORIF) WRIST FRACTURE;  Surgeon: Lovell Sheehan, MD;  Location: [facility] ORS;  Service: Orthopedics;  Laterality: Left;  . TUBAL  LIGATION Bilateral   . VAGINAL HYSTERECTOMY  03/2006    There were no vitals filed for this visit.  Subjective Assessment - 01/24/18 1306    Subjective  Patient reports she is feeling better today. Has brought her new AFO's. Still doesn't feel comfortable in them.     Pertinent History  Pt is a 57 y/o F who presents with BLE weakness and difficulty ambulating due to h/o MS that was diagnosed in 1995. Pt denies any pain associated. Pt was admitted to the hospital in November 2018 due to MS exacerbation. Nov-Dec to HHPT-->[facility] until end of Jan. After rehab the pt was able to ambulate up to 100 ft with the RW. Pt has Bil AFO, her therapist at [facility] thinks she needs a new AFO as her feet still drag when she ambulates, pt reports HHPT mentioned a spring AFO. Pt has custom AFOs that were originally made 4 years ago. Goals: improve balance, to be able to get into the shower with her tub bench without assist, to be able to get up 2 steps at sister's home with railings on both sides but too far apart to reach both sides, to be able to drive adapted car. Pt is staying at other sister's house with one step to get inside and currently picking up L leg with UEs and  then stepping in. Pt steps down leading with LLE. Pt wants to work on walking side to side as this is challenging and she had not yet progressed to this at rehab. Pt able to get in/out the car with assist only to bring BLEs into the car. Hoping to drive by June or July with adapted car (already has one). Pt needs assist bringing LEs into and out of hospital bed. Pt able to perform sit>stand without assist from [facility] with a pillow on the seat for a boost. Pt denies any falls in the past 6 months. Pt has been increasing her time alone at home up to a few hours at a time to become more independent. Pt has both manual and power WC. Uses power WC at home, uses manual WC out in community. Pt able to self propel manual WC. Pt needs assist putting pants  around ankles but she can pull them up. Pt can put on her shoes but needs help getting her leg up to do so. Pt able to tie her shoelaces on her own. Pt has a tub/shower unit at home with a tub bench. No grab bars in the shower. Has hand held shower head. Pt currently taking a sponge bath. Pt has a regular height toilet with a BSC over top. Pt with h/o L wrist fx with no precautions, pt wore her soft brace to evaluation for comfort but reports her wrist has healed and is doing well following OT.     Limitations  Lifting;Standing;Walking;House hold activities    How long can you sit comfortably?  n/a    How long can you stand comfortably?  3 minutes without UE assist, limited by fatigue    How long can you walk comfortably?  100 ft, limited by fatigue    Currently in Pain?  No/denies        Patient wearing new AFOs:   Donning/doffing braces  Standing in // bars:  Standing with BUE : patient felt unsteady ; felt like she was going to fall forwards/knees buckle; 3x30 seconds, CGA  Weight shift left and right 2 minutes BUE support ; CGA   Standing marches 10x each leg, CGA ; 2 sets   Ambulate in // bars 2x length of bars ; decreased foot clearance of LLE due to poor weight shift with RLE.    L bottom of foot analysis : small scaly, bruised, 1 inch diameter spot of skin that appears infected/irritated.  Recommend patient return to doctor.   Weighted ball raises 10x (3000Gr) straight arm raises 10x, horizontal twists 10x , tosses 10x; 2 sets each                     PT Education - 01/24/18 1307    Education provided  Yes    Education Details  exercise technique, AFO     Person(s) Educated  Patient    Methods  Explanation;Demonstration;Verbal cues    Comprehension  Verbalized understanding;Returned demonstration       PT Short Term Goals - 12/25/17 1155      PT SHORT TERM GOAL #1   Title  Pt will be completed HEP with min assist from caregiver at least 4 days/wk for  improved carryover between sessions    Baseline  HEP compliant     Time  2    Period  Weeks    Status  Partially Met      PT SHORT TERM GOAL #2   Title  Patient will report a worst pain of 6/10 on VAS in cervical spine to improve tolerance with ADLs and reduced symptoms with activities.     Baseline  4/30: 9/10 5/21: 0/10 pain    Time  2    Period  Weeks    Status  Achieved      PT SHORT TERM GOAL #3   Title  Patient will perform cervical rotation at least 50 degrees, flexion and extension to 70 degrees, and sidebending to 40 degrees without pain    Baseline  4/30: R 50 L 34 Rotation, R 26 L 35 sb, Extension 35 flex 35 ; 5/21: extension 46, SB R 41 L 40, rotation R 62 L 61,     Time  2    Period  Weeks    Status  Achieved        PT Long Term Goals - 12/25/17 1104      PT LONG TERM GOAL #1   Title  Pt's 5xSTS will impove to equal to or below 2 minutes to demonstrate improved balance and BLE strength    Baseline  3.02 minutes; 5/15: 1 min 30 seconds    Time  6    Period  Weeks    Status  Achieved      PT LONG TERM GOAL #2   Title  Pt's 54mT will improve to at least 0.5 m/s for improved safety ambulating in home    Baseline  0.14 m/s; 5/15: .267m  7/10: .36 m/s  8/20: .42 m/s     Time  8    Period  Weeks    Status  Partially Met    Target Date  02/19/18      PT LONG TERM GOAL #3   Title  Pt's Bil hip F strength will improve to at least 2+/5 BLEs for improved gait mechanics and safety    Baseline  L: 2/5, R: 1/5 7/10: 2/5 bilaterally  8/20: 2/5 bilat    Time  8    Period  Weeks    Status  Partially Met    Target Date  02/19/18      PT LONG TERM GOAL #4   Title  Pt's ABC Scale will improve to at least 40% to demonstrate improved balance    Baseline  28.13% 5/15; 24.4% 7/10: 23.4% 8/20: 22. 2%    Time  6    Period  Weeks    Status  Partially Met    Target Date  02/19/18      PT LONG TERM GOAL #5   Title  Pt will be able to perform car transfer without assist to  work towards pt's goal of returning to driving    Baseline  only needs help with one leg to put onto running board to get into sisters car; 8/20: in and out of own car independently.     Time  6    Period  Weeks    Status  Achieved      Additional Long Term Goals   Additional Long Term Goals  Yes      PT LONG TERM GOAL #6   Title  Pt will be able to perform tub bench into/out of shower transfer without assist for improved independence with this task    Baseline  challenged with steps at this time, getting better, stands and gives sponge bath independently now     Time  6    Period  Weeks    Status  Partially Met      PT LONG TERM GOAL #7   Title  Patient will perform cervical rotation at least 60 degrees, flexion and extension to 70 degrees, and sidebending to 40 degrees without pain    Baseline  4/30: R 50 L 34 Rotation, R 26 L 35 sb, Extension 35 flex 35; 5/21: extension 46, SB R 41 L 40, rotation R 62 L 61,     Time  4    Period  Weeks    Status  Achieved      PT LONG TERM GOAL #8   Title  Patient will improve NDI to <28% to reduce disability to mild  percieved disability for improved quality of life.     Baseline  4/30: 56% severe disability 5/21: 10%    Time  4    Period  Weeks    Status  Achieved      PT LONG TERM GOAL  #9   TITLE  Patient will report a worst pain of 3/10 on VAS in  cervical spine  to improve tolerance with ADLs and reduced symptoms with activities.     Baseline  4/30: 9/10 5/21: 0/10 in neck     Time  4    Period  Weeks    Status  Achieved      PT LONG TERM GOAL  #10   TITLE  Pt's 5xSTS will impove to equal to or below 50 seconds to demonstrate improved balance and BLE strength    Baseline  5/15: 1 min 30.9 seconds 7/10: 64 seconds  8/20: 1 min 29 seconds with L foot frequently slipping out requiring patient to perform multiple attempts per one stand    Time  8    Period  Weeks    Status  Partially Met    Target Date  02/19/18      PT LONG TERM  GOAL  #11   TITLE  Patient will descend/ascend down a handicap ramp  with RW and with supervision to increase independence in home environment.     Baseline  7/10 can ascend, unable to descend ; 8/20: achieved on 8/31    Time  6    Period  Weeks    Status  Achieved      PT LONG TERM GOAL  #12   TITLE  Patient will negotiate one step ~4 inches to enter house independently to increase functional independence in natural environment.     Baseline  8/20: unable to do     Time  8    Period  Weeks    Status  New    Target Date  02/19/18            Plan - 01/24/18 1427    Clinical Impression Statement  Patient has small 1 ich diameter spot on L foot that appears infected, educated patient on need to go to doctor, patient in agreeance. Patient challenged by ambulation and weight shift with ambulation due to limited weight shift over LLE to clear RLE. Patient will continue to benefit from skilled PT to improve mobility and independence     Rehab Potential  Good    PT Frequency  2x / week    PT Duration  8 weeks    PT Treatment/Interventions  ADLs/Self Care Home Management;Aquatic Therapy;Biofeedback;Cryotherapy;Electrical Stimulation;Iontophoresis 62m/ml Dexamethasone;Traction;Moist Heat;Ultrasound;DME Instruction;Gait training;Stair training;Functional mobility training;Therapeutic activities;Therapeutic exercise;Balance training;Neuromuscular re-education;Patient/family education;Orthotic Fit/Training;Wheelchair mobility training;Manual techniques;Compression bandaging;Passive range of motion;Dry needling;Energy conservation;Taping;Splinting    PT Next Visit  Plan  standing exercises in new AFO    PT Home Exercise Plan  to initiate next session (please incorporate hip flexor and adductor stretches in HEP if possible -KG)    Consulted and Agree with Plan of Care  Patient       Patient will benefit from skilled therapeutic intervention in order to improve the following deficits and  impairments:  Abnormal gait, Decreased activity tolerance, Decreased balance, Decreased coordination, Decreased endurance, Decreased knowledge of use of DME, Decreased mobility, Decreased range of motion, Decreased safety awareness, Decreased strength, Difficulty walking, Impaired perceived functional ability, Impaired sensation, Impaired tone, Impaired UE functional use, Improper body mechanics, Postural dysfunction  Visit Diagnosis: Muscle weakness (generalized)  Unsteadiness on feet  Other abnormalities of gait and mobility     Problem List Patient Active Problem List   Diagnosis Date Noted  . Wrist fracture 01/16/2017  . Health care maintenance 01/24/2016  . Blood pressure elevated without history of HTN 10/25/2015  . Multiple sclerosis ([place]) 10/02/2015  . Chronic left shoulder pain 07/19/2015  . Multiple sclerosis exacerbation ([place]) 07/14/2015  . MS (multiple sclerosis) ([place]) 11/26/2014  . Increased body mass index 11/26/2014  . HPV test positive 11/26/2014  . Status post laparoscopic supracervical hysterectomy 11/26/2014  . Galactorrhea 11/26/2014  . Tobacco user 11/26/2014  . Back ache 05/21/2014  . Adiposity 05/21/2014  . Disordered sleep 05/21/2014  . Muscle spasticity 05/21/2014  . Calculus of kidney 12/09/2013  . Renal colic 81/15/7262  . Hypercholesteremia 08/19/2013  . Hereditary and idiopathic neuropathy 08/19/2013  . Hypercholesterolemia without hypertriglyceridemia 08/19/2013  . Bladder infection, chronic 07/25/2012  . Disorder of bladder function 07/25/2012  . Incomplete bladder emptying 07/25/2012  . Microscopic hematuria 07/25/2012   Janna Arch, PT, DPT   01/24/2018, 2:29 PM  [place] MAIN [facility] SERVICES [address] [place], [place], [address] Phone: [phone]   Fax:  [phone]  Name: Arihanna Estabrook Cabeza MRN: [id number] Date of Birth: 1961-05-05

## 2018-01-27 DIAGNOSIS — G35 Multiple sclerosis: Secondary | ICD-10-CM | POA: Diagnosis not present

## 2018-01-29 ENCOUNTER — Ambulatory Visit: Payer: PPO

## 2018-01-29 DIAGNOSIS — M6281 Muscle weakness (generalized): Secondary | ICD-10-CM | POA: Diagnosis not present

## 2018-01-29 DIAGNOSIS — R2681 Unsteadiness on feet: Secondary | ICD-10-CM

## 2018-01-29 DIAGNOSIS — R2689 Other abnormalities of gait and mobility: Secondary | ICD-10-CM

## 2018-01-29 NOTE — Therapy (Signed)
[place] [facility] SERVICES [address] [place], [place], [address] Phone: [phone]   Fax:  [phone]  Physical Therapy Treatment  Patient Details  Name: Lisa Williams MRN: [id number] Date of Birth: May 14, 1960 Referring Provider: Gurney Maxin, MD   Encounter Date: 01/29/2018  PT End of Session - 01/29/18 1454    Visit Number  42    Number of Visits  49    Date for PT Re-Evaluation  02/19/18    Authorization Type  9/10 ( PN start 8/20)    PT Start Time  1259    PT Stop Time  1345    PT Time Calculation (min)  46 min    Equipment Utilized During Treatment  Gait belt    Activity Tolerance  Patient limited by fatigue;Patient tolerated treatment well    Behavior During Therapy  [facility] for tasks assessed/performed       Past Medical History:  Diagnosis Date  . Abdominal pain, right upper quadrant   . Back pain   . Calculus of kidney 12/09/2013  . Chronic back pain    unspecified  . Chronic left shoulder pain 07/19/2015  . Functional disorder of bladder    other  . Galactorrhea 11/26/2014   Chronic   . Hereditary and idiopathic neuropathy 08/19/2013  . HPV test positive   . Hypercholesteremia 08/19/2013  . Incomplete bladder emptying   . Microscopic hematuria   . MS (multiple sclerosis) ([place])   . Muscle spasticity 05/21/2014  . Nonspecific findings on examination of urine    other  . Osteopenia   . Status post laparoscopic supracervical hysterectomy 11/26/2014  . Tobacco user 11/26/2014  . Wrist fracture     Past Surgical History:  Procedure Laterality Date  . bilateral tubal ligation  1996  . BREAST CYST EXCISION Left 2002  . KNEE SURGERY     right  . LAPAROSCOPIC SUPRACERVICAL HYSTERECTOMY  08/05/2013  . ORIF WRIST FRACTURE Left 01/17/2017   Procedure: OPEN REDUCTION INTERNAL FIXATION (ORIF) WRIST FRACTURE;  Surgeon: Lovell Sheehan, MD;  Location: [facility] ORS;  Service: Orthopedics;  Laterality: Left;  . TUBAL  LIGATION Bilateral   . VAGINAL HYSTERECTOMY  03/2006    There were no vitals filed for this visit.  Subjective Assessment - 01/29/18 1453    Subjective  Patient reports she is doing well and has been compliant with HEP. Does not feel comfortable still with AFOs and wants to go back to orthotist for remodeling /refitting.     Pertinent History  Pt is a 57 y/o F who presents with BLE weakness and difficulty ambulating due to h/o MS that was diagnosed in 1995. Pt denies any pain associated. Pt was admitted to the hospital in November 2018 due to MS exacerbation. Nov-Dec to HHPT-->[facility] until end of Jan. After rehab the pt was able to ambulate up to 100 ft with the RW. Pt has Bil AFO, her therapist at [facility] thinks she needs a new AFO as her feet still drag when she ambulates, pt reports HHPT mentioned a spring AFO. Pt has custom AFOs that were originally made 4 years ago. Goals: improve balance, to be able to get into the shower with her tub bench without assist, to be able to get up 2 steps at sister's home with railings on both sides but too far apart to reach both sides, to be able to drive adapted car. Pt is staying at other sister's house with one step to  get inside and currently picking up L leg with UEs and then stepping in. Pt steps down leading with LLE. Pt wants to work on walking side to side as this is challenging and she had not yet progressed to this at rehab. Pt able to get in/out the car with assist only to bring BLEs into the car. Hoping to drive by June or July with adapted car (already has one). Pt needs assist bringing LEs into and out of hospital bed. Pt able to perform sit>stand without assist from [facility] with a pillow on the seat for a boost. Pt denies any falls in the past 6 months. Pt has been increasing her time alone at home up to a few hours at a time to become more independent. Pt has both manual and power WC. Uses power WC at home, uses manual WC out in community. Pt able  to self propel manual WC. Pt needs assist putting pants around ankles but she can pull them up. Pt can put on her shoes but needs help getting her leg up to do so. Pt able to tie her shoelaces on her own. Pt has a tub/shower unit at home with a tub bench. No grab bars in the shower. Has hand held shower head. Pt currently taking a sponge bath. Pt has a regular height toilet with a BSC over top. Pt with h/o L wrist fx with no precautions, pt wore her soft brace to evaluation for comfort but reports her wrist has healed and is doing well following OT.     Limitations  Lifting;Standing;Walking;House hold activities    How long can you sit comfortably?  n/a    How long can you stand comfortably?  3 minutes without UE assist, limited by fatigue    How long can you walk comfortably?  100 ft, limited by fatigue    Currently in Pain?  No/denies       Do goals next session  Patient wearing new AFOs:    Donning/doffing shorts technique with reacher for independent ADL performance in home environment: Lift and bring R leg onto L knee, pull short leg over right leg and return leg to floor, use reacher to spread shorts into the correct position and help lift while simultaneously lifting L foot into hole. Use reacher to pull shorts up to hand level.  Repeated to demonstrate independence.   Donning/doffing braces x2 trials   Standing in // bars:             Standing with BUE : patient felt unsteady ; felt like she was going to fall forwards/knees buckle; 3x30 seconds, CGA             Weight shift left and right 2 minutes BUE support ; CGA             Standing marches 10x each leg, CGA ; 2 sets with  second set 15x each leg.    Step over and back line in // bars BUE support 6x each leg; patient fatigued quickly             Weighted ball raises 10x (3000Gr)   straight arm raises 10x,   horizontal twists 10x ,   tosses 10x;    Patient requires use of ice between interventions to lower internal body  temperature.                            PT Education -  01/29/18 1454    Education provided  Yes    Education Details  exercise technique, returning to orthotist for AFO refitting.     Person(s) Educated  Patient    Methods  Explanation;Demonstration;Verbal cues    Comprehension  Verbalized understanding;Returned demonstration       PT Short Term Goals - 12/25/17 1155      PT SHORT TERM GOAL #1   Title  Pt will be completed HEP with min assist from caregiver at least 4 days/wk for improved carryover between sessions    Baseline  HEP compliant     Time  2    Period  Weeks    Status  Partially Met      PT SHORT TERM GOAL #2   Title  Patient will report a worst pain of 6/10 on VAS in cervical spine to improve tolerance with ADLs and reduced symptoms with activities.     Baseline  4/30: 9/10 5/21: 0/10 pain    Time  2    Period  Weeks    Status  Achieved      PT SHORT TERM GOAL #3   Title  Patient will perform cervical rotation at least 50 degrees, flexion and extension to 70 degrees, and sidebending to 40 degrees without pain    Baseline  4/30: R 50 L 34 Rotation, R 26 L 35 sb, Extension 35 flex 35 ; 5/21: extension 46, SB R 41 L 40, rotation R 62 L 61,     Time  2    Period  Weeks    Status  Achieved        PT Long Term Goals - 12/25/17 1104      PT LONG TERM GOAL #1   Title  Pt's 5xSTS will impove to equal to or below 2 minutes to demonstrate improved balance and BLE strength    Baseline  3.02 minutes; 5/15: 1 min 30 seconds    Time  6    Period  Weeks    Status  Achieved      PT LONG TERM GOAL #2   Title  Pt's 76mT will improve to at least 0.5 m/s for improved safety ambulating in home    Baseline  0.14 m/s; 5/15: .246m  7/10: .36 m/s  8/20: .42 m/s     Time  8    Period  Weeks    Status  Partially Met    Target Date  02/19/18      PT LONG TERM GOAL #3   Title  Pt's Bil hip F strength will improve to at least 2+/5 BLEs for improved gait  mechanics and safety    Baseline  L: 2/5, R: 1/5 7/10: 2/5 bilaterally  8/20: 2/5 bilat    Time  8    Period  Weeks    Status  Partially Met    Target Date  02/19/18      PT LONG TERM GOAL #4   Title  Pt's ABC Scale will improve to at least 40% to demonstrate improved balance    Baseline  28.13% 5/15; 24.4% 7/10: 23.4% 8/20: 22. 2%    Time  6    Period  Weeks    Status  Partially Met    Target Date  02/19/18      PT LONG TERM GOAL #5   Title  Pt will be able to perform car transfer without assist to work towards pt's goal of returning to driving  Baseline  only needs help with one leg to put onto running board to get into sisters car; 8/20: in and out of own car independently.     Time  6    Period  Weeks    Status  Achieved      Additional Long Term Goals   Additional Long Term Goals  Yes      PT LONG TERM GOAL #6   Title  Pt will be able to perform tub bench into/out of shower transfer without assist for improved independence with this task    Baseline  challenged with steps at this time, getting better, stands and gives sponge bath independently now     Time  6    Period  Weeks    Status  Partially Met      PT LONG TERM GOAL #7   Title  Patient will perform cervical rotation at least 60 degrees, flexion and extension to 70 degrees, and sidebending to 40 degrees without pain    Baseline  4/30: R 50 L 34 Rotation, R 26 L 35 sb, Extension 35 flex 35; 5/21: extension 46, SB R 41 L 40, rotation R 62 L 61,     Time  4    Period  Weeks    Status  Achieved      PT LONG TERM GOAL #8   Title  Patient will improve NDI to <28% to reduce disability to mild  percieved disability for improved quality of life.     Baseline  4/30: 56% severe disability 5/21: 10%    Time  4    Period  Weeks    Status  Achieved      PT LONG TERM GOAL  #9   TITLE  Patient will report a worst pain of 3/10 on VAS in  cervical spine  to improve tolerance with ADLs and reduced symptoms with activities.      Baseline  4/30: 9/10 5/21: 0/10 in neck     Time  4    Period  Weeks    Status  Achieved      PT LONG TERM GOAL  #10   TITLE  Pt's 5xSTS will impove to equal to or below 50 seconds to demonstrate improved balance and BLE strength    Baseline  5/15: 1 min 30.9 seconds 7/10: 64 seconds  8/20: 1 min 29 seconds with L foot frequently slipping out requiring patient to perform multiple attempts per one stand    Time  8    Period  Weeks    Status  Partially Met    Target Date  02/19/18      PT LONG TERM GOAL  #11   TITLE  Patient will descend/ascend down a handicap ramp  with RW and with supervision to increase independence in home environment.     Baseline  7/10 can ascend, unable to descend ; 8/20: achieved on 8/31    Time  6    Period  Weeks    Status  Achieved      PT LONG TERM GOAL  #12   TITLE  Patient will negotiate one step ~4 inches to enter house independently to increase functional independence in natural environment.     Baseline  8/20: unable to do     Time  8    Period  Weeks    Status  New    Target Date  02/19/18  Plan - 01/29/18 1459    Clinical Impression Statement  Patient feels unsteady wearing new AFOs and will go back to orthotist for refitting.  Patient educated and performed clothing donning/doffing to increase independence in home environment. Patient fatigued quickly in new braces partially due to fear of LOB resulting in increased muscle tension as well as from increased internal body temperature resulting in fatigue.  Patient will continue to benefit from skilled PT to improve mobility and independence     Rehab Potential  Good    PT Frequency  2x / week    PT Duration  8 weeks    PT Treatment/Interventions  ADLs/Self Care Home Management;Aquatic Therapy;Biofeedback;Cryotherapy;Electrical Stimulation;Iontophoresis 18m/ml Dexamethasone;Traction;Moist Heat;Ultrasound;DME Instruction;Gait training;Stair training;Functional mobility  training;Therapeutic activities;Therapeutic exercise;Balance training;Neuromuscular re-education;Patient/family education;Orthotic Fit/Training;Wheelchair mobility training;Manual techniques;Compression bandaging;Passive range of motion;Dry needling;Energy conservation;Taping;Splinting    PT Next Visit Plan  standing exercises in new AFO    PT Home Exercise Plan  to initiate next session (please incorporate hip flexor and adductor stretches in HEP if possible -KG)    Consulted and Agree with Plan of Care  Patient       Patient will benefit from skilled therapeutic intervention in order to improve the following deficits and impairments:  Abnormal gait, Decreased activity tolerance, Decreased balance, Decreased coordination, Decreased endurance, Decreased knowledge of use of DME, Decreased mobility, Decreased range of motion, Decreased safety awareness, Decreased strength, Difficulty walking, Impaired perceived functional ability, Impaired sensation, Impaired tone, Impaired UE functional use, Improper body mechanics, Postural dysfunction  Visit Diagnosis: Muscle weakness (generalized)  Unsteadiness on feet  Other abnormalities of gait and mobility     Problem List Patient Active Problem List   Diagnosis Date Noted  . Wrist fracture 01/16/2017  . Health care maintenance 01/24/2016  . Blood pressure elevated without history of HTN 10/25/2015  . [place] ([place] 10/02/2015  . Chronic left shoulder pain 07/19/2015  . [place] 07/14[place]2015  . [place]) [place] 11/26/2014  . Increased body mass index 11/26/2014  . HPV test positive 11/26/2014  . Status post laparoscopic supracervical hysterectomy 11/26/2014  . Galactorrhea 11/26/2014  . Tobacco user 11/26/2014  . Back ache 05/21/2014  . Adiposity 05/21/2014  . Disordered sleep 05/21/2014  . Muscle spasticity 05/21/2014  . Calculus of kidney 12/09/2013  . Renal colic [id number] .  Hypercholesteremia 08/19/2013  . Hereditary and idiopathic neuropathy 08/19/2013  . Hypercholesterolemia without hypertriglyceridemia 08/19/2013  . Bladder infection, chronic 07/25/2012  . Disorder of bladder function 07/25/2012  . Incomplete bladder emptying 07/25/2012  . Microscopic hematuria 07/25/2012   MJanna Williams PT, DPT   01/29/2018, 3:00 PM  [facility]SERV[facility] [address]RN[address] [address]Phone: [phone]  Fax:  [phone] Name: JSkyra CrichlowRamseur MRN: [id number] of Birth: 6Dec 06, 1962

## 2018-01-30 DIAGNOSIS — L309 Dermatitis, unspecified: Secondary | ICD-10-CM | POA: Diagnosis not present

## 2018-01-31 ENCOUNTER — Ambulatory Visit: Payer: PPO

## 2018-01-31 DIAGNOSIS — R2681 Unsteadiness on feet: Secondary | ICD-10-CM

## 2018-01-31 DIAGNOSIS — R2689 Other abnormalities of gait and mobility: Secondary | ICD-10-CM

## 2018-01-31 DIAGNOSIS — M6281 Muscle weakness (generalized): Secondary | ICD-10-CM

## 2018-01-31 NOTE — Therapy (Addendum)
[place] MAIN [facility] SERVICES [address] [place], [place], [address] Phone: [phone]   Fax:  [phone]  Physical Therapy Treatment/Physical Therapy Progress Note   Dates of reporting period  12/25/17  to  01/31/18   Patient Details  Name: Lisa Williams MRN: [id number] Date of Birth: May 13, 1960 Referring Provider (PT): Gurney Maxin, MD   Encounter Date: 01/31/2018  PT End of Session - 01/31/18 1359    Visit Number  43    Number of Visits  49    Date for PT Re-Evaluation  02/19/18    Authorization Type  10/10 ( PN start 8/20)    PT Start Time  1259    PT Stop Time  1345    PT Time Calculation (min)  46 min    Equipment Utilized During Treatment  Gait belt    Activity Tolerance  Patient limited by fatigue;Patient tolerated treatment well    Behavior During Therapy  [facility] for tasks assessed/performed       Past Medical History:  Diagnosis Date  . Abdominal pain, right upper quadrant   . Back pain   . Calculus of kidney 12/09/2013  . Chronic back pain    unspecified  . Chronic left shoulder pain 07/19/2015  . Functional disorder of bladder    other  . Galactorrhea 11/26/2014   Chronic   . Hereditary and idiopathic neuropathy 08/19/2013  . HPV test positive   . Hypercholesteremia 08/19/2013  . Incomplete bladder emptying   . Microscopic hematuria   . MS (multiple sclerosis) ([place])   . Muscle spasticity 05/21/2014  . Nonspecific findings on examination of urine    other  . Osteopenia   . Status post laparoscopic supracervical hysterectomy 11/26/2014  . Tobacco user 11/26/2014  . Wrist fracture     Past Surgical History:  Procedure Laterality Date  . bilateral tubal ligation  1996  . BREAST CYST EXCISION Left 2002  . KNEE SURGERY     right  . LAPAROSCOPIC SUPRACERVICAL HYSTERECTOMY  08/05/2013  . ORIF WRIST FRACTURE Left 01/17/2017   Procedure: OPEN REDUCTION INTERNAL FIXATION (ORIF) WRIST FRACTURE;  Surgeon:  Lovell Sheehan, MD;  Location: [facility] ORS;  Service: Orthopedics;  Laterality: Left;  . TUBAL LIGATION Bilateral   . VAGINAL HYSTERECTOMY  03/2006    There were no vitals filed for this visit.    Subjective Assessment - 01/31/18 1355    Subjective  Patient reports not feeling as good today secondary to afternoon session time. Also reports feeling more hot when arriving to therapy.  States she has an an appointment with doctor yesterday and rash on foot is healing properly.    Pertinent History  Pt is a 57 y/o F who presents with BLE weakness and difficulty ambulating due to h/o MS that was diagnosed in 1995. Pt denies any pain associated. Pt was admitted to the hospital in November 2018 due to MS exacerbation. Nov-Dec to HHPT-->[facility] until end of Jan. After rehab the pt was able to ambulate up to 100 ft with the RW. Pt has Bil AFO, her therapist at [facility] thinks she needs a new AFO as her feet still drag when she ambulates, pt reports HHPT mentioned a spring AFO. Pt has custom AFOs that were originally made 4 years ago. Goals: improve balance, to be able to get into the shower with her tub bench without assist, to be able to get up 2 steps at sister's home with railings on both  sides but too far apart to reach both sides, to be able to drive adapted car. Pt is staying at other sister's house with one step to get inside and currently picking up L leg with UEs and then stepping in. Pt steps down leading with LLE. Pt wants to work on walking side to side as this is challenging and she had not yet progressed to this at rehab. Pt able to get in/out the car with assist only to bring BLEs into the car. Hoping to drive by June or July with adapted car (already has one). Pt needs assist bringing LEs into and out of hospital bed. Pt able to perform sit>stand without assist from [facility] with a pillow on the seat for a boost. Pt denies any falls in the past 6 months. Pt has been increasing her time alone at  home up to a few hours at a time to become more independent. Pt has both manual and power WC. Uses power WC at home, uses manual WC out in community. Pt able to self propel manual WC. Pt needs assist putting pants around ankles but she can pull them up. Pt can put on her shoes but needs help getting her leg up to do so. Pt able to tie her shoelaces on her own. Pt has a tub/shower unit at home with a tub bench. No grab bars in the shower. Has hand held shower head. Pt currently taking a sponge bath. Pt has a regular height toilet with a BSC over top. Pt with h/o L wrist fx with no precautions, pt wore her soft brace to evaluation for comfort but reports her wrist has healed and is doing well following OT.     Limitations  Lifting;Standing;Walking;House hold activities    How long can you sit comfortably?  n/a    How long can you stand comfortably?  3 minutes without UE assist, limited by fatigue    How long can you walk comfortably?  100 ft, limited by fatigue    Patient Stated Goals  see above    Currently in Pain?  No/denies    Pain Onset  Today      Wearing old AFOs: 49mt- 52 seconds; 39 seconds 0.235m CGA and wheelchair follow Abc- 25.3% 5xsts-1st trial 4 completed (1 minutes 11 seconds). Increased time and terminated due to wheelchair being unstable increasing patient difficulty to stand for surface. Attempted 2nd trial but patient too fatigued to stand from wheelchair. CGA    Seated: Marches x15 each leg; verbal and tactile cues for muscle activation  Adductor isometric ball squeeze x15 3 second holds; Cues to maintain muscle contraction.   Abductor isometric squeeze 15x 3 second holds. Visual and tactile cues for muscle activation  Seated balloon taps. 3 minutes reaching inside and outside BOS for core and postural righting reactions   Seated weighted ball raises 10x (3000Gr). Cues for upright posture and decrease back support             straight arm raises 10x,               horizontal twists 10x ,              tosses 10x;    Patient requires use of ice between interventions to lower internal body temperature.       Patient's condition has the potential to improve in response to therapy. Maximum improvement is yet to be obtained. The anticipated improvement is attainable and reasonable in a generally  predictable time.  Patient reports feeling stronger and able to do more since she has been able to have functional mobility to drive independently.                     PT Education - 01/31/18 1357    Education provided  Yes    Education Details  exercise technique, returning to orthotist for AFO refitting    Person(s) Educated  Patient    Methods  Explanation;Demonstration;Verbal cues    Comprehension  Verbalized understanding;Returned demonstration       PT Short Term Goals - 01/31/18 1354      PT SHORT TERM GOAL #1   Title  Pt will be completed HEP with min assist from caregiver at least 4 days/wk for improved carryover between sessions    Baseline  HEP compliant     Time  2    Period  Weeks    Status  Achieved      PT SHORT TERM GOAL #2   Title  Patient will report a worst pain of 6/10 on VAS in cervical spine to improve tolerance with ADLs and reduced symptoms with activities.     Baseline  4/30: 9/10 5/21: 0/10 pain    Time  2    Period  Weeks    Status  Achieved      PT SHORT TERM GOAL #3   Title  Patient will perform cervical rotation at least 50 degrees, flexion and extension to 70 degrees, and sidebending to 40 degrees without pain    Baseline  4/30: R 50 L 34 Rotation, R 26 L 35 sb, Extension 35 flex 35 ; 5/21: extension 46, SB R 41 L 40, rotation R 62 L 61,     Time  2    Period  Weeks    Status  Achieved        PT Long Term Goals - 01/31/18 1322      PT LONG TERM GOAL #1   Title  Pt's 5xSTS will impove to equal to or below 2 minutes to demonstrate improved balance and BLE strength    Baseline  3.02 minutes; 5/15:  1 min 30 seconds 9/26:unable to complete fully secondary to patient not feeling well    Time  6    Period  Weeks    Status  Achieved      PT LONG TERM GOAL #2   Title  Pt's 85mT will improve to at least 0.5 m/s for improved safety ambulating in home    Baseline  0.14 m/s; 5/15: .275m  7/10: .36 m/s  8/20: .42 m/s  9/26: 0.265m   Time  8    Period  Weeks    Status  Partially Met      PT LONG TERM GOAL #3   Title  Pt's Bil hip F strength will improve to at least 2+/5 BLEs for improved gait mechanics and safety    Baseline  L: 2/5, R: 1/5 7/10: 2/5 bilaterally  8/20: 2/5 bilat 9/26: 2/5 bilat    Time  8    Period  Weeks    Status  Partially Met      PT LONG TERM GOAL #4   Title  Pt's ABC Scale will improve to at least 40% to demonstrate improved balance    Baseline  28.13% 5/15; 24.4% 7/10: 23.4% 8/20: 22. 2% 9/26: 25.3%    Time  6    Period  Weeks  Status  Partially Met      PT LONG TERM GOAL #5   Title  Pt will be able to perform car transfer without assist to work towards pt's goal of returning to driving    Baseline  only needs help with one leg to put onto running board to get into sisters car; 8/20: in and out of own car independently.     Time  6    Period  Weeks    Status  Achieved      PT LONG TERM GOAL #6   Title  Pt will be able to perform tub bench into/out of shower transfer without assist for improved independence with this task    Baseline  challenged with steps at this time, getting better, stands and gives sponge bath independently now  9/26: has not tried transfer yet    Time  6    Period  Weeks    Status  Partially Met      PT LONG TERM GOAL #7   Title  Patient will perform cervical rotation at least 60 degrees, flexion and extension to 70 degrees, and sidebending to 40 degrees without pain    Baseline  4/30: R 50 L 34 Rotation, R 26 L 35 sb, Extension 35 flex 35; 5/21: extension 46, SB R 41 L 40, rotation R 62 L 61,     Time  4    Period  Weeks     Status  Achieved      PT LONG TERM GOAL #8   Title  Patient will improve NDI to <28% to reduce disability to mild  percieved disability for improved quality of life.     Baseline  4/30: 56% severe disability 5/21: 10%    Time  4    Period  Weeks    Status  Achieved      PT LONG TERM GOAL  #9   TITLE  Patient will report a worst pain of 3/10 on VAS in  cervical spine  to improve tolerance with ADLs and reduced symptoms with activities.     Baseline  4/30: 9/10 5/21: 0/10 in neck     Time  4    Period  Weeks    Status  Achieved      PT LONG TERM GOAL  #10   TITLE  Pt's 5xSTS will impove to equal to or below 50 seconds to demonstrate improved balance and BLE strength    Baseline  5/15: 1 min 30.9 seconds 7/10: 64 seconds  8/20: 1 min 29 seconds with L foot frequently slipping out requiring patient to perform multiple attempts per one stand    Time  8    Period  Weeks    Status  Partially Met      PT LONG TERM GOAL  #11   TITLE  Patient will descend/ascend down a handicap ramp  with RW and with supervision to increase independence in home environment.     Baseline  7/10 can ascend, unable to descend ; 8/20: achieved on 8/31    Time  6    Period  Weeks    Status  Achieved      PT LONG TERM GOAL  #12   TITLE  Patient will negotiate one step ~4 inches to enter house independently to increase functional independence in natural environment.     Baseline  8/20: unable to do     Time  8    Period  Weeks  Status  New            Plan - 01/31/18 1408    Clinical Impression Statement  Patient goals were attempted to be reassessed today. All goals could not be reassessed secondary to patient not feeling well and increased fatigue. Because of this, did not want to push patient past submaximal activities to avoid overheating and increased fatigue due to multiple sclerosis. This could also explain limited to no progress made on goals that were assessed. Goals were be reassessed again  closer to re certification day during a morning session. Patient continues to be motivated displayed by work ethic at home and in therapy session. Patient's condition has the potential to improve in response to therapy. Maximum improvement is yet to be obtained. The anticipated improvement is attainable and reasonable in a generally predictable time. Patient will continue to benefit from skilled physical therapy services to improve mobility and independence.     Rehab Potential  Good    PT Frequency  2x / week    PT Duration  8 weeks    PT Treatment/Interventions  ADLs/Self Care Home Management;Aquatic Therapy;Biofeedback;Cryotherapy;Electrical Stimulation;Iontophoresis 35m/ml Dexamethasone;Traction;Moist Heat;Ultrasound;DME Instruction;Gait training;Stair training;Functional mobility training;Therapeutic activities;Therapeutic exercise;Balance training;Neuromuscular re-education;Patient/family education;Orthotic Fit/Training;Wheelchair mobility training;Manual techniques;Compression bandaging;Passive range of motion;Dry needling;Energy conservation;Taping;Splinting    PT Next Visit Plan  standing exercises in new AFO    PT Home Exercise Plan  to initiate next session (please incorporate hip flexor and adductor stretches in HEP if possible -KG)    Consulted and Agree with Plan of Care  Patient       Patient will benefit from skilled therapeutic intervention in order to improve the following deficits and impairments:  Abnormal gait, Decreased activity tolerance, Decreased balance, Decreased coordination, Decreased endurance, Decreased knowledge of use of DME, Decreased mobility, Decreased range of motion, Decreased safety awareness, Decreased strength, Difficulty walking, Impaired perceived functional ability, Impaired sensation, Impaired tone, Impaired UE functional use, Improper body mechanics, Postural dysfunction  Visit Diagnosis: Muscle weakness (generalized)  Unsteadiness on feet  Other  abnormalities of gait and mobility     Problem List Patient Active Problem List   Diagnosis Date Noted  . Wrist fracture 01/16/2017  . Health care maintenance 01/24/2016  . Blood pressure elevated without history of HTN 10/25/2015  . [place] 10/02/2015  . Chronic left shoulder pain 07/19/2015  . [place] sclerosis exacerbation ([place] 07/14/2015  . MS (multiple sclerosis) ([place] 11/26/2014  . Increased body mass index 11/26/2014  . HPV test positive 11/26/2014  . Status post laparoscopic supracervical hysterectomy 11/26/2014  . Galactorrhea 11/26/2014  . Tobacco user 11/26/2014  . Back ache 05/21/2014  . Adiposity 05/21/2014  . Disordered sleep 05/21/2014  . Muscle spasticity 05/21/2014  . Calculus of kidney 12/09/2013  . Renal colic 011/57/2620 . Hypercholesteremia 08/19/2013  . Hereditary and idiopathic neuropathy 08/19/2013  . Hypercholesterolemia without hypertriglyceridemia 08/19/2013  . Bladder infection, chronic 07/25/2012  . Disorder of bladder function 07/25/2012  . Incomplete bladder emptying 07/25/2012  . Microscopic hematuria 07/25/2012   AErick Blinks SPT This entire session was performed under direct supervision and direction of a licensed therapist/therapist assistant . I have personally read, edited and approve of the note as written.   MJanna Arch PT, DPT   01/31/2018, 2:12 PM  [place] [facility] [place] [address] [address]Phone: [phone]  Fax:  [phone] Name: Lisa TaborRamseur MRN: [id number] of Birth: [birth year]

## 2018-02-04 ENCOUNTER — Ambulatory Visit: Payer: PPO | Admitting: Physical Therapy

## 2018-02-04 DIAGNOSIS — M6281 Muscle weakness (generalized): Secondary | ICD-10-CM

## 2018-02-04 DIAGNOSIS — R2681 Unsteadiness on feet: Secondary | ICD-10-CM

## 2018-02-04 DIAGNOSIS — R2689 Other abnormalities of gait and mobility: Secondary | ICD-10-CM

## 2018-02-04 NOTE — Therapy (Addendum)
[address] MAIN [facility] SERVICES [place], [place], [address] Phone: [phone]   Fax:  [phone]  Physical Therapy Treatment  Patient Details  Name: Lisa Williams MRN: [id number] Date of Birth: 08-09-1960 Referring Provider (PT): Gurney Maxin, MD   Encounter Date: 02/04/2018  PT End of Session - 02/04/18 1220    Visit Number  [id number]    Number of Visits  49    Date for PT Re-Evaluation  02/19/18    Authorization Type  1/10 PN start 01/31/18    PT Start Time  1120    PT Stop Time  1205    PT Time Calculation (min)  45 min    Equipment Utilized During Treatment  Gait belt    Activity Tolerance  Patient limited by fatigue;Patient tolerated treatment well    Behavior During Therapy  [facility] for tasks assessed/performed       Past Medical History:  Diagnosis Date  . Abdominal pain, right upper quadrant   . Back pain   . Calculus of kidney 12/09/2013  . Chronic back pain    unspecified  . Chronic left shoulder pain 07/19/2015  . Functional disorder of bladder    other  . Galactorrhea 11/26/2014   Chronic   . Hereditary and idiopathic neuropathy 08/19/2013  . HPV test positive   . Hypercholesteremia 08/19/2013  . Incomplete bladder emptying   . Microscopic hematuria   . MS (multiple sclerosis) ([place])   . Muscle spasticity 05/21/2014  . Nonspecific findings on examination of urine    other  . Osteopenia   . Status post laparoscopic supracervical hysterectomy 11/26/2014  . Tobacco user 11/26/2014  . Wrist fracture     Past Surgical History:  Procedure Laterality Date  . bilateral tubal ligation  1996  . BREAST CYST EXCISION Left 2002  . KNEE SURGERY     right  . LAPAROSCOPIC SUPRACERVICAL HYSTERECTOMY  08/05/2013  . ORIF WRIST FRACTURE Left 01/17/2017   Procedure: OPEN REDUCTION INTERNAL FIXATION (ORIF) WRIST FRACTURE;  Surgeon: Lovell Sheehan, MD;  Location: [facility] ORS;  Service: Orthopedics;  Laterality: Left;  . TUBAL  LIGATION Bilateral   . VAGINAL HYSTERECTOMY  03/2006    There were no vitals filed for this visit.  Subjective Assessment - 02/04/18 1218    Subjective  Patient states she is feeling much better today. States she has an appointment to get new AFOs refitted next week.     Pertinent History  Pt is a 57 y/o F who presents with BLE weakness and difficulty ambulating due to h/o MS that was diagnosed in 1995. Pt denies any pain associated. Pt was admitted to the hospital in November 2018 due to MS exacerbation. Nov-Dec to HHPT-->[facility] until end of Jan. After rehab the pt was able to ambulate up to 100 ft with the RW. Pt has Bil AFO, her therapist at [place] thinks she needs a new AFO as her feet still drag when she ambulates, pt reports HHPT mentioned a spring AFO. Pt has custom AFOs that were originally made 4 years ago. Goals: improve balance, to be able to get into the shower with her tub bench without assist, to be able to get up 2 steps at sister's home with railings on both sides but too far apart to reach both sides, to be able to drive adapted car. Pt is staying at other sister's house with one step to get inside and currently picking up L leg with  UEs and then stepping in. Pt steps down leading with LLE. Pt wants to work on walking side to side as this is challenging and she had not yet progressed to this at rehab. Pt able to get in/out the car with assist only to bring BLEs into the car. Hoping to drive by June or July with adapted car (already has one). Pt needs assist bringing LEs into and out of hospital bed. Pt able to perform sit>stand without assist from [facility] with a pillow on the seat for a boost. Pt denies any falls in the past 6 months. Pt has been increasing her time alone at home up to a few hours at a time to become more independent. Pt has both manual and power WC. Uses power WC at home, uses manual WC out in community. Pt able to self propel manual WC. Pt needs assist putting pants  around ankles but she can pull them up. Pt can put on her shoes but needs help getting her leg up to do so. Pt able to tie her shoelaces on her own. Pt has a tub/shower unit at home with a tub bench. No grab bars in the shower. Has hand held shower head. Pt currently taking a sponge bath. Pt has a regular height toilet with a BSC over top. Pt with h/o L wrist fx with no precautions, pt wore her soft brace to evaluation for comfort but reports her wrist has healed and is doing well following OT.     Limitations  Lifting;Standing;Walking;House hold activities    How long can you sit comfortably?  n/a    How long can you stand comfortably?  3 minutes without UE assist, limited by fatigue    How long can you walk comfortably?  100 ft, limited by fatigue    Patient Stated Goals  see above    Pain Onset  Today         Wearing old AFOs.    Standing in // bars:  Standing with BUE : 2 minutes CGA  Weight shift left and right 2 minutes BUE support ; CGA  Standing marches 10x each leg, CGA ; 2 sets second set 15x each leg. Verbal cues to lift knee towards the ceiling   Ambulate in // bars 2x length of bars down/back ; decreased foot clearance of LLE due to poor weight shift with RLE. Verbal cues to weight shift prior to each step. CGA  Seated: Marches x12 each leg; verbal and tactile cues for muscle activation   Adductor isometric ball squeeze x15 3 second holds; Cues to maintain muscle contraction.    Abductor isometric squeeze 15x 3 second holds. Visual and tactile cues for muscle activation   Gluteal squeezes x 15 3 second holds.   Seated balloon taps. 3 minutes reaching inside and outside BOS for core and postural righting reactions   Seated weighted ball  (3000Gr). Cues for upright posture and decrease back support.              straight arm raises 15x, Cued to decrease speed of movement             horizontal twists 10x , verbal cues to maintain elbow extension   tosses 10x;     Patient requires use of ice between interventions to lower internal body temperature.                      PT Education - 02/04/18 1219  Education provided  Yes    Education Details  exercise technique     Person(s) Educated  Patient    Methods  Explanation;Demonstration;Verbal cues    Comprehension  Verbalized understanding;Returned demonstration       PT Short Term Goals - 01/31/18 1354      PT SHORT TERM GOAL #1   Title  Pt will be completed HEP with min assist from caregiver at least 4 days/wk for improved carryover between sessions    Baseline  HEP compliant     Time  2    Period  Weeks    Status  Achieved      PT SHORT TERM GOAL #2   Title  Patient will report a worst pain of 6/10 on VAS in cervical spine to improve tolerance with ADLs and reduced symptoms with activities.     Baseline  4/30: 9/10 5/21: 0/10 pain    Time  2    Period  Weeks    Status  Achieved      PT SHORT TERM GOAL #3   Title  Patient will perform cervical rotation at least 50 degrees, flexion and extension to 70 degrees, and sidebending to 40 degrees without pain    Baseline  4/30: R 50 L 34 Rotation, R 26 L 35 sb, Extension 35 flex 35 ; 5/21: extension 46, SB R 41 L 40, rotation R 62 L 61,     Time  2    Period  Weeks    Status  Achieved        PT Long Term Goals - 01/31/18 1322      PT LONG TERM GOAL #1   Title  Pt's 5xSTS will impove to equal to or below 2 minutes to demonstrate improved balance and BLE strength    Baseline  3.02 minutes; 5/15: 1 min 30 seconds 9/26:unable to complete fully secondary to patient not feeling well    Time  6    Period  Weeks    Status  Achieved      PT LONG TERM GOAL #2   Title  Pt's 75mT will improve to at least 0.5 m/s for improved safety ambulating in home    Baseline  0.14 m/s; 5/15: .275m  7/10: .36 m/s  8/20: .42 m/s  9/26: 0.2654m   Time  8    Period  Weeks    Status  Partially Met      PT LONG TERM GOAL #3   Title  Pt's  Bil hip F strength will improve to at least 2+/5 BLEs for improved gait mechanics and safety    Baseline  L: 2/5, R: 1/5 7/10: 2/5 bilaterally  8/20: 2/5 bilat 9/26: 2/5 bilat    Time  8    Period  Weeks    Status  Partially Met      PT LONG TERM GOAL #4   Title  Pt's ABC Scale will improve to at least 40% to demonstrate improved balance    Baseline  28.13% 5/15; 24.4% 7/10: 23.4% 8/20: 22. 2% 9/26: 25.3%    Time  6    Period  Weeks    Status  Partially Met      PT LONG TERM GOAL #5   Title  Pt will be able to perform car transfer without assist to work towards pt's goal of returning to driving    Baseline  only needs help with one leg to put onto running board to get into  sisters car; 8/20: in and out of own car independently.     Time  6    Period  Weeks    Status  Achieved      PT LONG TERM GOAL #6   Title  Pt will be able to perform tub bench into/out of shower transfer without assist for improved independence with this task    Baseline  challenged with steps at this time, getting better, stands and gives sponge bath independently now  9/26: has not tried transfer yet    Time  6    Period  Weeks    Status  Partially Met      PT LONG TERM GOAL #7   Title  Patient will perform cervical rotation at least 60 degrees, flexion and extension to 70 degrees, and sidebending to 40 degrees without pain    Baseline  4/30: R 50 L 34 Rotation, R 26 L 35 sb, Extension 35 flex 35; 5/21: extension 46, SB R 41 L 40, rotation R 62 L 61,     Time  4    Period  Weeks    Status  Achieved      PT LONG TERM GOAL #8   Title  Patient will improve NDI to <28% to reduce disability to mild  percieved disability for improved quality of life.     Baseline  4/30: 56% severe disability 5/21: 10%    Time  4    Period  Weeks    Status  Achieved      PT LONG TERM GOAL  #9   TITLE  Patient will report a worst pain of 3/10 on VAS in  cervical spine  to improve tolerance with ADLs and reduced symptoms with  activities.     Baseline  4/30: 9/10 5/21: 0/10 in neck     Time  4    Period  Weeks    Status  Achieved      PT LONG TERM GOAL  #10   TITLE  Pt's 5xSTS will impove to equal to or below 50 seconds to demonstrate improved balance and BLE strength    Baseline  5/15: 1 min 30.9 seconds 7/10: 64 seconds  8/20: 1 min 29 seconds with L foot frequently slipping out requiring patient to perform multiple attempts per one stand    Time  8    Period  Weeks    Status  Partially Met    Target Date  02/19/18      PT LONG TERM GOAL  #11   TITLE  Patient will descend/ascend down a handicap ramp  with RW and with supervision to increase independence in home environment.     Baseline  7/10 can ascend, unable to descend ; 8/20: achieved on 8/31    Time  6    Period  Weeks    Status  Achieved      PT LONG TERM GOAL  #12   TITLE  Patient will negotiate one step ~4 inches to enter house independently to increase functional independence in natural environment.     Baseline  8/20: unable to do 9/26: patient too fatigued/not feeling well to perform    Time  8    Period  Weeks    Status  On-going    Target Date  02/19/18            Plan - 02/04/18 1216    Clinical Impression Statement  Patient required less assistance and had less difficulty  with standing from wheel chair to complete standing exercises likely do to increased rest over weekend.Patient ambulated in parallel bars with improved weight shift but with increased difficulty with duration of task. Patient was self aware of needing rest break to prevent overheating and fatigue to improve activity tolerance which led to ability to complete more standing tasks. Patient will continue to benefit from skilled physical therapy to improve mobility and independence.     Rehab Potential  Good    PT Frequency  2x / week    PT Duration  8 weeks    PT Treatment/Interventions  ADLs/Self Care Home Management;Aquatic Therapy;Biofeedback;Cryotherapy;Electrical  Stimulation;Iontophoresis 51m/ml Dexamethasone;Traction;Moist Heat;Ultrasound;DME Instruction;Gait training;Stair training;Functional mobility training;Therapeutic activities;Therapeutic exercise;Balance training;Neuromuscular re-education;Patient/family education;Orthotic Fit/Training;Wheelchair mobility training;Manual techniques;Compression bandaging;Passive range of motion;Dry needling;Energy conservation;Taping;Splinting    PT Next Visit Plan  standing exercises in new AFO    PT Home Exercise Plan  to initiate next session (please incorporate hip flexor and adductor stretches in HEP if possible -KG)    Consulted and Agree with Plan of Care  Patient       Patient will benefit from skilled therapeutic intervention in order to improve the following deficits and impairments:  Abnormal gait, Decreased activity tolerance, Decreased balance, Decreased coordination, Decreased endurance, Decreased knowledge of use of DME, Decreased mobility, Decreased range of motion, Decreased safety awareness, Decreased strength, Difficulty walking, Impaired perceived functional ability, Impaired sensation, Impaired tone, Impaired UE functional use, Improper body mechanics, Postural dysfunction  Visit Diagnosis: Muscle weakness (generalized)  Unsteadiness on feet  Other abnormalities of gait and mobility     Problem List Patient Active Problem List   Diagnosis Date Noted  . Wrist fracture 01/16/2017  . Health care maintenance 01/24/2016  . Blood pressure elevated without history of HTN 10/25/2015  . Multiple sclerosis ([place] 10/02/2015  . Chronic left shoulder pain 07/19/2015  . Multiple sclerosis exacerbation ([place] 07/14/2015  . [place] 11/26/2014  . Increased body mass index 11/26/2014  . HPV test positive 11/26/2014  . Status post laparoscopic supracervical hysterectomy 11/26/2014  . Galactorrhea 11/26/2014  . Tobacco user 11/26/2014  . Back ache 05/21/2014  . Adiposity  05/21/2014  . Disordered sleep 05/21/2014  . Muscle spasticity 05/21/2014  . Calculus of kidney 12/09/2013  . Renal colic 016/38/4536 . Hypercholesteremia 08/19/2013  . Hereditary and idiopathic neuropathy 08/19/2013  . Hypercholesterolemia without hypertriglyceridemia 08/19/2013  . Bladder infection, chronic 07/25/2012  . Disorder of bladder function 07/25/2012  . Incomplete bladder emptying 07/25/2012  . Microscopic hematuria 07/25/2012   AErick Blinks SPT 02/04/2018, 3:01 PM   This entire session was performed under direct supervision and direction of a licensed therapist/therapist assistant . I have personally read, edited and approve of the note as written. ACollie SiadPT, DSmithvilleMAIN RLakeland Community HospitalSERVICES [address]ROaks [place] [address]Phone: [phone]  Fax:  [phone] Name: Lisa DobkinsRamseur MRN: [id number] of Birth: [birth year]

## 2018-02-06 ENCOUNTER — Ambulatory Visit: Payer: PPO | Attending: Neurology

## 2018-02-06 DIAGNOSIS — R2681 Unsteadiness on feet: Secondary | ICD-10-CM | POA: Diagnosis not present

## 2018-02-06 DIAGNOSIS — R2689 Other abnormalities of gait and mobility: Secondary | ICD-10-CM | POA: Diagnosis not present

## 2018-02-06 DIAGNOSIS — M6281 Muscle weakness (generalized): Secondary | ICD-10-CM | POA: Insufficient documentation

## 2018-02-06 NOTE — Therapy (Addendum)
[place] MAIN [facility] SERVICES [address] [place], [place], [address] Phone: [phone]   Fax:  [phone]  Physical Therapy Treatment  Patient Details  Name: Lisa Williams MRN: [id number] Date of Birth: 1960-05-13 Referring Provider (PT): Gurney Maxin, MD   Encounter Date: 02/06/2018  PT End of Session - 02/06/18 1202    Visit Number  45    Number of Visits  49    Date for PT Re-Evaluation  02/19/18    Authorization Type  2/10 PN start 01/31/18    PT Start Time  1100    PT Stop Time  1145    PT Time Calculation (min)  45 min    Equipment Utilized During Treatment  Gait belt    Activity Tolerance  Patient limited by fatigue;Patient tolerated treatment well    Behavior During Therapy  [facility] for tasks assessed/performed       Past Medical History:  Diagnosis Date  . Abdominal pain, right upper quadrant   . Back pain   . Calculus of kidney 12/09/2013  . Chronic back pain    unspecified  . Chronic left shoulder pain 07/19/2015  . Functional disorder of bladder    other  . Galactorrhea 11/26/2014   Chronic   . Hereditary and idiopathic neuropathy 08/19/2013  . HPV test positive   . Hypercholesteremia 08/19/2013  . Incomplete bladder emptying   . Microscopic hematuria   . MS (multiple sclerosis) ([place])   . Muscle spasticity 05/21/2014  . Nonspecific findings on examination of urine    other  . Osteopenia   . Status post laparoscopic supracervical hysterectomy 11/26/2014  . Tobacco user 11/26/2014  . Wrist fracture     Past Surgical History:  Procedure Laterality Date  . bilateral tubal ligation  1996  . BREAST CYST EXCISION Left 2002  . KNEE SURGERY     right  . LAPAROSCOPIC SUPRACERVICAL HYSTERECTOMY  08/05/2013  . ORIF WRIST FRACTURE Left 01/17/2017   Procedure: OPEN REDUCTION INTERNAL FIXATION (ORIF) WRIST FRACTURE;  Surgeon: Lovell Sheehan, MD;  Location: [facility] ORS;  Service: Orthopedics;  Laterality: Left;  . TUBAL  LIGATION Bilateral   . VAGINAL HYSTERECTOMY  03/2006    There were no vitals filed for this visit.  Subjective Assessment - 02/06/18 1058    Subjective  Patient states she is feeling good. Reports they moved appointment with orthotist to get AFOs refitted to10/16.     Pertinent History  Pt is a 57 y/o F who presents with BLE weakness and difficulty ambulating due to h/o MS that was diagnosed in 1995. Pt denies any pain associated. Pt was admitted to the hospital in November 2018 due to MS exacerbation. Nov-Dec to HHPT-->[facility] until end of Jan. After rehab the pt was able to ambulate up to 100 ft with the RW. Pt has Bil AFO, her therapist at [place] thinks she needs a new AFO as her feet still drag when she ambulates, pt reports HHPT mentioned a spring AFO. Pt has custom AFOs that were originally made 4 years ago. Goals: improve balance, to be able to get into the shower with her tub bench without assist, to be able to get up 2 steps at sister's home with railings on both sides but too far apart to reach both sides, to be able to drive adapted car. Pt is staying at other sister's house with one step to get inside and currently picking up L leg with UEs and then  stepping in. Pt steps down leading with LLE. Pt wants to work on walking side to side as this is challenging and she had not yet progressed to this at rehab. Pt able to get in/out the car with assist only to bring BLEs into the car. Hoping to drive by June or July with adapted car (already has one). Pt needs assist bringing LEs into and out of hospital bed. Pt able to perform sit>stand without assist from [facility] with a pillow on the seat for a boost. Pt denies any falls in the past 6 months. Pt has been increasing her time alone at home up to a few hours at a time to become more independent. Pt has both manual and power WC. Uses power WC at home, uses manual WC out in community. Pt able to self propel manual WC. Pt needs assist putting pants  around ankles but she can pull them up. Pt can put on her shoes but needs help getting her leg up to do so. Pt able to tie her shoelaces on her own. Pt has a tub/shower unit at home with a tub bench. No grab bars in the shower. Has hand held shower head. Pt currently taking a sponge bath. Pt has a regular height toilet with a BSC over top. Pt with h/o L wrist fx with no precautions, pt wore her soft brace to evaluation for comfort but reports her wrist has healed and is doing well following OT.     Limitations  Lifting;Standing;Walking;House hold activities    How long can you sit comfortably?  n/a    How long can you stand comfortably?  3 minutes without UE assist, limited by fatigue    How long can you walk comfortably?  100 ft, limited by fatigue    Patient Stated Goals  see above    Currently in Pain?  No/denies    Pain Onset  Today         Wearing old AFOs.   Standing in // bars:   Standing with BUE : 2 minutes CGA   Weight shift left and right 2 minutes BUE support ; CGA   Standing marches 50x each leg, CGA ;  Verbal cues to lift knee towards the ceiling   Ambulate in // bars x2 length of bars down/back ; decreased foot clearance of LLE due to poor weight shift with RLE. Verbal cues to weight shift prior to each step. CGA   Supine: Marches x10 each leg with bolster under knees. Active assisted with mod-maxA provided by therapist to complete full range.   SAQ x10 each leg. Verbal cues to decrease speed of movement.   Bridges 2x10. Assist provided at feet and knees for stabilization and prevention of knee falling out. Verbal cues to breathe and keep arms at sides.  Seated:   Seated balloon taps. 3 minutes reaching inside and outside BOS for core and postural righting reactions    Seated weighted ball  (3000Gr). Cues for upright posture and decrease back support.              straight arm raises 15x, Cued to decrease speed of movement             horizontal twists 15x , verbal  cues to maintain elbow extension              tosses 15x;   Sit to stand transfer to/from wheel chair to plinth with RW and CGA.  Patient requires use of ice  between interventions to lower internal body temperature.                          PT Education - 02/06/18 1201    Education provided  Yes    Education Details  exercise technique    Person(s) Educated  Patient    Methods  Explanation;Demonstration;Verbal cues    Comprehension  Verbalized understanding;Returned demonstration       PT Short Term Goals - 01/31/18 1354      PT SHORT TERM GOAL #1   Title  Pt will be completed HEP with min assist from caregiver at least 4 days/wk for improved carryover between sessions    Baseline  HEP compliant     Time  2    Period  Weeks    Status  Achieved      PT SHORT TERM GOAL #2   Title  Patient will report a worst pain of 6/10 on VAS in cervical spine to improve tolerance with ADLs and reduced symptoms with activities.     Baseline  4/30: 9/10 5/21: 0/10 pain    Time  2    Period  Weeks    Status  Achieved      PT SHORT TERM GOAL #3   Title  Patient will perform cervical rotation at least 50 degrees, flexion and extension to 70 degrees, and sidebending to 40 degrees without pain    Baseline  4/30: R 50 L 34 Rotation, R 26 L 35 sb, Extension 35 flex 35 ; 5/21: extension 46, SB R 41 L 40, rotation R 62 L 61,     Time  2    Period  Weeks    Status  Achieved        PT Long Term Goals - 01/31/18 1322      PT LONG TERM GOAL #1   Title  Pt's 5xSTS will impove to equal to or below 2 minutes to demonstrate improved balance and BLE strength    Baseline  3.02 minutes; 5/15: 1 min 30 seconds 9/26:unable to complete fully secondary to patient not feeling well    Time  6    Period  Weeks    Status  Achieved      PT LONG TERM GOAL #2   Title  Pt's 39mT will improve to at least 0.5 m/s for improved safety ambulating in home    Baseline  0.14 m/s; 5/15: .247m  7/10:  .36 m/s  8/20: .42 m/s  9/26: 0.2653m   Time  8    Period  Weeks    Status  Partially Met      PT LONG TERM GOAL #3   Title  Pt's Bil hip F strength will improve to at least 2+/5 BLEs for improved gait mechanics and safety    Baseline  L: 2/5, R: 1/5 7/10: 2/5 bilaterally  8/20: 2/5 bilat 9/26: 2/5 bilat    Time  8    Period  Weeks    Status  Partially Met      PT LONG TERM GOAL #4   Title  Pt's ABC Scale will improve to at least 40% to demonstrate improved balance    Baseline  28.13% 5/15; 24.4% 7/10: 23.4% 8/20: 22. 2% 9/26: 25.3%    Time  6    Period  Weeks    Status  Partially Met      PT LONG TERM GOAL #5   Title  Pt will be able to perform car transfer without assist to work towards pt's goal of returning to driving    Baseline  only needs help with one leg to put onto running board to get into sisters car; 8/20: in and out of own car independently.     Time  6    Period  Weeks    Status  Achieved      PT LONG TERM GOAL #6   Title  Pt will be able to perform tub bench into/out of shower transfer without assist for improved independence with this task    Baseline  challenged with steps at this time, getting better, stands and gives sponge bath independently now  9/26: has not tried transfer yet    Time  6    Period  Weeks    Status  Partially Met      PT LONG TERM GOAL #7   Title  Patient will perform cervical rotation at least 60 degrees, flexion and extension to 70 degrees, and sidebending to 40 degrees without pain    Baseline  4/30: R 50 L 34 Rotation, R 26 L 35 sb, Extension 35 flex 35; 5/21: extension 46, SB R 41 L 40, rotation R 62 L 61,     Time  4    Period  Weeks    Status  Achieved      PT LONG TERM GOAL #8   Title  Patient will improve NDI to <28% to reduce disability to mild  percieved disability for improved quality of life.     Baseline  4/30: 56% severe disability 5/21: 10%    Time  4    Period  Weeks    Status  Achieved      PT LONG TERM GOAL  #9    TITLE  Patient will report a worst pain of 3/10 on VAS in  cervical spine  to improve tolerance with ADLs and reduced symptoms with activities.     Baseline  4/30: 9/10 5/21: 0/10 in neck     Time  4    Period  Weeks    Status  Achieved      PT LONG TERM GOAL  #10   TITLE  Pt's 5xSTS will impove to equal to or below 50 seconds to demonstrate improved balance and BLE strength    Baseline  5/15: 1 min 30.9 seconds 7/10: 64 seconds  8/20: 1 min 29 seconds with L foot frequently slipping out requiring patient to perform multiple attempts per one stand    Time  8    Period  Weeks    Status  Partially Met    Target Date  02/19/18      PT LONG TERM GOAL  #11   TITLE  Patient will descend/ascend down a handicap ramp  with RW and with supervision to increase independence in home environment.     Baseline  7/10 can ascend, unable to descend ; 8/20: achieved on 8/31    Time  6    Period  Weeks    Status  Achieved      PT LONG TERM GOAL  #12   TITLE  Patient will negotiate one step ~4 inches to enter house independently to increase functional independence in natural environment.     Baseline  8/20: unable to do 9/26: patient too fatigued/not feeling well to perform    Time  8    Period  Weeks    Status  On-going    Target Date  02/19/18            Plan - 02/06/18 1201    Clinical Impression Statement  Patient challenged with ambulation and weight shift over RLE to promote L foot clearance. Patient also has decreased hip flexion strength and activation further impairing gait mechanics and foot clearance. Patient completed bridges with assistance needed to stabilize LEs but was able to complete 2sets indicating glute strength and activation. Patient will continue to benefit from skilled physical therapy to improve mobility and independence.     Rehab Potential  Good    PT Frequency  2x / week    PT Duration  8 weeks    PT Treatment/Interventions  ADLs/Self Care Home Management;Aquatic  Therapy;Biofeedback;Cryotherapy;Electrical Stimulation;Iontophoresis 48m/ml Dexamethasone;Traction;Moist Heat;Ultrasound;DME Instruction;Gait training;Stair training;Functional mobility training;Therapeutic activities;Therapeutic exercise;Balance training;Neuromuscular re-education;Patient/family education;Orthotic Fit/Training;Wheelchair mobility training;Manual techniques;Compression bandaging;Passive range of motion;Dry needling;Energy conservation;Taping;Splinting    PT Next Visit Plan  standing exercises in new AFO    PT Home Exercise Plan  to initiate next session (please incorporate hip flexor and adductor stretches in HEP if possible -KG)    Consulted and Agree with Plan of Care  Patient       Patient will benefit from skilled therapeutic intervention in order to improve the following deficits and impairments:  Abnormal gait, Decreased activity tolerance, Decreased balance, Decreased coordination, Decreased endurance, Decreased knowledge of use of DME, Decreased mobility, Decreased range of motion, Decreased safety awareness, Decreased strength, Difficulty walking, Impaired perceived functional ability, Impaired sensation, Impaired tone, Impaired UE functional use, Improper body mechanics, Postural dysfunction  Visit Diagnosis: Muscle weakness (generalized)  Unsteadiness on feet  Other abnormalities of gait and mobility     Problem List Patient Active Problem List   Diagnosis Date Noted  . Wrist fracture 01/16/2017  . Health care maintenance 01/24/2016  . Blood pressure elevated without history of HTN 10/25/2015  . Multiple sclerosis [place] 10/02/2015  . Chronic left shoulder pain 07/19/2015  . [place] ([place] 07/14/2015  . [place]) [place] 11/26/2014  . Increased body mass index 11/26/2014  . HPV test positive 11/26/2014  . Status post laparoscopic supracervical hysterectomy 11/26/2014  . Galactorrhea 11/26/2014  . Tobacco user 11/26/2014   . Back ache 05/21/2014  . Adiposity 05/21/2014  . Disordered sleep 05/21/2014  . Muscle spasticity 05/21/2014  . Calculus of kidney 12/09/2013  . Renal colic 060/73/7106 . Hypercholesteremia 08/19/2013  . Hereditary and idiopathic neuropathy 08/19/2013  . Hypercholesterolemia without hypertriglyceridemia 08/19/2013  . Bladder infection, chronic 07/25/2012  . Disorder of bladder function 07/25/2012  . Incomplete bladder emptying 07/25/2012  . Microscopic hematuria 07/25/2012    AErick Blinks,SPT  This entire session was performed under direct supervision and direction of a licensed therapist/therapist assistant . I have personally read, edited and approve of the note as written.  MJanna Arch PT, DPT   02/06/2018, 12:55 PM  [place] [facility] [address] [address]Phone: [phone]  Fax:  [phone] Name: Lisa Williams MRN: [id number] of Birth: [birth year]

## 2018-02-11 DIAGNOSIS — G35 Multiple sclerosis: Secondary | ICD-10-CM | POA: Diagnosis not present

## 2018-02-12 ENCOUNTER — Ambulatory Visit: Payer: PPO

## 2018-02-12 DIAGNOSIS — M6281 Muscle weakness (generalized): Secondary | ICD-10-CM

## 2018-02-12 DIAGNOSIS — R2681 Unsteadiness on feet: Secondary | ICD-10-CM

## 2018-02-12 DIAGNOSIS — R2689 Other abnormalities of gait and mobility: Secondary | ICD-10-CM

## 2018-02-12 NOTE — Therapy (Signed)
[place] MAIN [facility] SERVICES [address] [place], [place], [address] Phone: [phone]   Fax:  [phone]  Physical Therapy Treatment  Patient Details  Name: Lisa Williams MRN: [id number] Date of Birth: [birth year] Referring Provider (PT): Gurney Maxin, MD   Encounter Date: 02/12/2018  PT End of Session - 02/12/18 1456    Visit Number  46    Number of Visits  49    Date for PT Re-Evaluation  02/19/18    Authorization Type  3/10 PN start 01/31/18    PT Start Time  1501    PT Stop Time  1546    PT Time Calculation (min)  45 min    Equipment Utilized During Treatment  Gait belt    Activity Tolerance  Patient limited by fatigue;Patient tolerated treatment well    Behavior During Therapy  [facility] for tasks assessed/performed       Past Medical History:  Diagnosis Date  . Abdominal pain, right upper quadrant   . Back pain   . Calculus of kidney 12/09/2013  . Chronic back pain    unspecified  . Chronic left shoulder pain 07/19/2015  . Functional disorder of bladder    other  . Galactorrhea 11/26/2014   Chronic   . Hereditary and idiopathic neuropathy 08/19/2013  . HPV test positive   . Hypercholesteremia 08/19/2013  . Incomplete bladder emptying   . Microscopic hematuria   . MS (multiple sclerosis) ([place])   . Muscle spasticity 05/21/2014  . Nonspecific findings on examination of urine    other  . Osteopenia   . Status post laparoscopic supracervical hysterectomy 11/26/2014  . Tobacco user 11/26/2014  . Wrist fracture     Past Surgical History:  Procedure Laterality Date  . bilateral tubal ligation  1996  . BREAST CYST EXCISION Left 2002  . KNEE SURGERY     right  . LAPAROSCOPIC SUPRACERVICAL HYSTERECTOMY  08/05/2013  . ORIF WRIST FRACTURE Left 01/17/2017   Procedure: OPEN REDUCTION INTERNAL FIXATION (ORIF) WRIST FRACTURE;  Surgeon: Lovell Sheehan, MD;  Location: [facility] ORS;  Service: Orthopedics;  Laterality: Left;  . TUBAL  LIGATION Bilateral   . VAGINAL HYSTERECTOMY  03/2006    There were no vitals filed for this visit.  Subjective Assessment - 02/12/18 1537    Subjective  Patient reports that she has been getting tingling in R abductor musculature on and off, reports it feels like a small needle gently pricking her when standing. Patient feels more energized in     Pertinent History  Pt is a 57 y/o F who presents with BLE weakness and difficulty ambulating due to h/o MS that was diagnosed in 1995. Pt denies any pain associated. Pt was admitted to the hospital in November 2018 due to MS exacerbation. Nov-Dec to HHPT-->[facility] until end of Jan. After rehab the pt was able to ambulate up to 100 ft with the RW. Pt has Bil AFO, her therapist at [facility] thinks she needs a new AFO as her feet still drag when she ambulates, pt reports HHPT mentioned a spring AFO. Pt has custom AFOs that were originally made 4 years ago. Goals: improve balance, to be able to get into the shower with her tub bench without assist, to be able to get up 2 steps at sister's home with railings on both sides but too far apart to reach both sides, to be able to drive adapted car. Pt is staying at other sister's house with  one step to get inside and currently picking up L leg with UEs and then stepping in. Pt steps down leading with LLE. Pt wants to work on walking side to side as this is challenging and she had not yet progressed to this at rehab. Pt able to get in/out the car with assist only to bring BLEs into the car. Hoping to drive by June or July with adapted car (already has one). Pt needs assist bringing LEs into and out of hospital bed. Pt able to perform sit>stand without assist from [facility] with a pillow on the seat for a boost. Pt denies any falls in the past 6 months. Pt has been increasing her time alone at home up to a few hours at a time to become more independent. Pt has both manual and power WC. Uses power WC at home, uses manual WC out  in community. Pt able to self propel manual WC. Pt needs assist putting pants around ankles but she can pull them up. Pt can put on her shoes but needs help getting her leg up to do so. Pt able to tie her shoelaces on her own. Pt has a tub/shower unit at home with a tub bench. No grab bars in the shower. Has hand held shower head. Pt currently taking a sponge bath. Pt has a regular height toilet with a BSC over top. Pt with h/o L wrist fx with no precautions, pt wore her soft brace to evaluation for comfort but reports her wrist has healed and is doing well following OT.     Limitations  Lifting;Standing;Walking;House hold activities    How long can you sit comfortably?  n/a    How long can you stand comfortably?  3 minutes without UE assist, limited by fatigue    How long can you walk comfortably?  100 ft, limited by fatigue    Patient Stated Goals  see above    Currently in Pain?  No/denies     Sit to stand: push knees into abduction against PT hands, cues for gluteal activation at top of stand for upright posture ; 5x 3 sets  Hip flexion : AAROM 10x, isometric 10x   Standing with RW: gluteal squeezes 5x 3 second holds x 2 trials   Seated: Balloon tapping (3 way) reaching inside and outside BOS for core and postural stability x 2 minutes.   Weighted ball straight arm raises 20x ( 2000Gr) to promote upright posture and strengthen core and stabilizer musculature   Weighted ball tosses 20x (2000Gr ball) x 20 to left and right x to promote pertubation's in seated position   Resisted hamstring curl isometric 10x each LE                      PT Education - 02/12/18 1455    Education provided  Yes    Education Details  exercise technique, modifications for fatigue    Person(s) Educated  Patient    Methods  Explanation;Demonstration;Verbal cues    Comprehension  Verbalized understanding;Returned demonstration       PT Short Term Goals - 01/31/18 1354      PT SHORT TERM  GOAL #1   Title  Pt will be completed HEP with min assist from caregiver at least 4 days/wk for improved carryover between sessions    Baseline  HEP compliant     Time  2    Period  Weeks    Status  Achieved  PT SHORT TERM GOAL #2   Title  Patient will report a worst pain of 6/10 on VAS in cervical spine to improve tolerance with ADLs and reduced symptoms with activities.     Baseline  4/30: 9/10 5/21: 0/10 pain    Time  2    Period  Weeks    Status  Achieved      PT SHORT TERM GOAL #3   Title  Patient will perform cervical rotation at least 50 degrees, flexion and extension to 70 degrees, and sidebending to 40 degrees without pain    Baseline  4/30: R 50 L 34 Rotation, R 26 L 35 sb, Extension 35 flex 35 ; 5/21: extension 46, SB R 41 L 40, rotation R 62 L 61,     Time  2    Period  Weeks    Status  Achieved        PT Long Term Goals - 01/31/18 1322      PT LONG TERM GOAL #1   Title  Pt's 5xSTS will impove to equal to or below 2 minutes to demonstrate improved balance and BLE strength    Baseline  3.02 minutes; 5/15: 1 min 30 seconds 9/26:unable to complete fully secondary to patient not feeling well    Time  6    Period  Weeks    Status  Achieved      PT LONG TERM GOAL #2   Title  Pt's 51mT will improve to at least 0.5 m/s for improved safety ambulating in home    Baseline  0.14 m/s; 5/15: .257m  7/10: .36 m/s  8/20: .42 m/s  9/26: 0.2661m   Time  8    Period  Weeks    Status  Partially Met      PT LONG TERM GOAL #3   Title  Pt's Bil hip F strength will improve to at least 2+/5 BLEs for improved gait mechanics and safety    Baseline  L: 2/5, R: 1/5 7/10: 2/5 bilaterally  8/20: 2/5 bilat 9/26: 2/5 bilat    Time  8    Period  Weeks    Status  Partially Met      PT LONG TERM GOAL #4   Title  Pt's ABC Scale will improve to at least 40% to demonstrate improved balance    Baseline  28.13% 5/15; 24.4% 7/10: 23.4% 8/20: 22. 2% 9/26: 25.3%    Time  6    Period  Weeks     Status  Partially Met      PT LONG TERM GOAL #5   Title  Pt will be able to perform car transfer without assist to work towards pt's goal of returning to driving    Baseline  only needs help with one leg to put onto running board to get into sisters car; 8/20: in and out of own car independently.     Time  6    Period  Weeks    Status  Achieved      PT LONG TERM GOAL #6   Title  Pt will be able to perform tub bench into/out of shower transfer without assist for improved independence with this task    Baseline  challenged with steps at this time, getting better, stands and gives sponge bath independently now  9/26: has not tried transfer yet    Time  6    Period  Weeks    Status  Partially Met  PT LONG TERM GOAL #7   Title  Patient will perform cervical rotation at least 60 degrees, flexion and extension to 70 degrees, and sidebending to 40 degrees without pain    Baseline  4/30: R 50 L 34 Rotation, R 26 L 35 sb, Extension 35 flex 35; 5/21: extension 46, SB R 41 L 40, rotation R 62 L 61,     Time  4    Period  Weeks    Status  Achieved      PT LONG TERM GOAL #8   Title  Patient will improve NDI to <28% to reduce disability to mild  percieved disability for improved quality of life.     Baseline  4/30: 56% severe disability 5/21: 10%    Time  4    Period  Weeks    Status  Achieved      PT LONG TERM GOAL  #9   TITLE  Patient will report a worst pain of 3/10 on VAS in  cervical spine  to improve tolerance with ADLs and reduced symptoms with activities.     Baseline  4/30: 9/10 5/21: 0/10 in neck     Time  4    Period  Weeks    Status  Achieved      PT LONG TERM GOAL  #10   TITLE  Pt's 5xSTS will impove to equal to or below 50 seconds to demonstrate improved balance and BLE strength    Baseline  5/15: 1 min 30.9 seconds 7/10: 64 seconds  8/20: 1 min 29 seconds with L foot frequently slipping out requiring patient to perform multiple attempts per one stand    Time  8     Period  Weeks    Status  Partially Met    Target Date  02/19/18      PT LONG TERM GOAL  #11   TITLE  Patient will descend/ascend down a handicap ramp  with RW and with supervision to increase independence in home environment.     Baseline  7/10 can ascend, unable to descend ; 8/20: achieved on 8/31    Time  6    Period  Weeks    Status  Achieved      PT LONG TERM GOAL  #12   TITLE  Patient will negotiate one step ~4 inches to enter house independently to increase functional independence in natural environment.     Baseline  8/20: unable to do 9/26: patient too fatigued/not feeling well to perform    Time  8    Period  Weeks    Status  On-going    Target Date  02/19/18            Plan - 02/13/18 0801    Clinical Impression Statement  Patient challenged with sit to stand transitions requiring use of LE more than UE due to preference to transfer with triceps bias. Patient demonstrated improvement with verbal and tactile cueing. Patient has intermittent tingling/pins and needles of adductor of RLE, advised patient to monitor. Patient will continue to benefit from skilled physical therapy to improve mobility and independence.     Rehab Potential  Good    PT Frequency  2x / week    PT Duration  8 weeks    PT Treatment/Interventions  ADLs/Self Care Home Management;Aquatic Therapy;Biofeedback;Cryotherapy;Electrical Stimulation;Iontophoresis 47m/ml Dexamethasone;Traction;Moist Heat;Ultrasound;DME Instruction;Gait training;Stair training;Functional mobility training;Therapeutic activities;Therapeutic exercise;Balance training;Neuromuscular re-education;Patient/family education;Orthotic Fit/Training;Wheelchair mobility training;Manual techniques;Compression bandaging;Passive range of motion;Dry needling;Energy conservation;Taping;Splinting    PT  Next Visit Plan  standing exercises in new AFO    PT Home Exercise Plan  to initiate next session (please incorporate hip flexor and adductor stretches  in HEP if possible -KG)    Consulted and Agree with Plan of Care  Patient       Patient will benefit from skilled therapeutic intervention in order to improve the following deficits and impairments:  Abnormal gait, Decreased activity tolerance, Decreased balance, Decreased coordination, Decreased endurance, Decreased knowledge of use of DME, Decreased mobility, Decreased range of motion, Decreased safety awareness, Decreased strength, Difficulty walking, Impaired perceived functional ability, Impaired sensation, Impaired tone, Impaired UE functional use, Improper body mechanics, Postural dysfunction  Visit Diagnosis: Muscle weakness (generalized)  Unsteadiness on feet  Other abnormalities of gait and mobility     Problem List Patient Active Problem List   Diagnosis Date Noted  . Wrist fracture 01/16/2017  . Health care maintenance 01/24/2016  . Blood pressure elevated without history of HTN 10/25/2015  . Multiple sclerosis ([place]) 10/02/2015  . Chronic left shoulder pain 07/19/2015  . Multiple sclerosis exacerbation ([place]) 07/14/2015  . MS (multiple sclerosis) ([place]) 11/26/2014  . Increased body mass index 11/26/2014  . HPV test positive 11/26/2014  . Status post laparoscopic supracervical hysterectomy 11/26/2014  . Galactorrhea 11/26/2014  . Tobacco user 11/26/2014  . Back ache 05/21/2014  . Adiposity 05/21/2014  . Disordered sleep 05/21/2014  . Muscle spasticity 05/21/2014  . Calculus of kidney 12/09/2013  . Renal colic 68/03/5725  . Hypercholesteremia 08/19/2013  . Hereditary and idiopathic neuropathy 08/19/2013  . Hypercholesterolemia without hypertriglyceridemia 08/19/2013  . Bladder infection, chronic 07/25/2012  . Disorder of bladder function 07/25/2012  . Incomplete bladder emptying 07/25/2012  . Microscopic hematuria 07/25/2012   Janna Arch, PT, DPT    02/13/2018, 8:02 AM  [place] MAIN [facility] SERVICES [address]  [place], [place], [address] Phone: [phone]   Fax:  [phone]  Name: Lisa Williams MRN: [id number] Date of Birth: June 09, 1960

## 2018-02-14 ENCOUNTER — Ambulatory Visit: Payer: PPO

## 2018-02-14 DIAGNOSIS — R2681 Unsteadiness on feet: Secondary | ICD-10-CM

## 2018-02-14 DIAGNOSIS — M8588 Other specified disorders of bone density and structure, other site: Secondary | ICD-10-CM | POA: Diagnosis not present

## 2018-02-14 DIAGNOSIS — M6281 Muscle weakness (generalized): Secondary | ICD-10-CM | POA: Diagnosis not present

## 2018-02-14 DIAGNOSIS — R2689 Other abnormalities of gait and mobility: Secondary | ICD-10-CM

## 2018-02-14 NOTE — Therapy (Addendum)
[place] [facility] SERVICES [address] [place], [place], [address] Phone: [phone]   Fax:  [phone]  Physical Therapy Treatment  Patient Details  Name: Lisa Williams MRN: [id number] Date of Birth: 1960/11/24 Referring Provider (PT): Gurney Maxin, MD   Encounter Date: 02/14/2018  PT End of Session - 02/14/18 1219    Visit Number  47    Number of Visits  49    Date for PT Re-Evaluation  02/19/18    Authorization Type  4/10 PN start 01/31/18    PT Start Time  1114    PT Stop Time  1200    PT Time Calculation (min)  46 min    Equipment Utilized During Treatment  Gait belt    Activity Tolerance  Patient limited by fatigue;Patient tolerated treatment well    Behavior During Therapy  [facility] for tasks assessed/performed       Past Medical History:  Diagnosis Date  . Abdominal pain, right upper quadrant   . Back pain   . Calculus of kidney 12/09/2013  . Chronic back pain    unspecified  . Chronic left shoulder pain 07/19/2015  . Functional disorder of bladder    other  . Galactorrhea 11/26/2014   Chronic   . Hereditary and idiopathic neuropathy 08/19/2013  . HPV test positive   . Hypercholesteremia 08/19/2013  . Incomplete bladder emptying   . Microscopic hematuria   . MS (multiple sclerosis) ([place])   . Muscle spasticity 05/21/2014  . Nonspecific findings on examination of urine    other  . Osteopenia   . Status post laparoscopic supracervical hysterectomy 11/26/2014  . Tobacco user 11/26/2014  . Wrist fracture     Past Surgical History:  Procedure Laterality Date  . bilateral tubal ligation  1996  . BREAST CYST EXCISION Left 2002  . KNEE SURGERY     right  . LAPAROSCOPIC SUPRACERVICAL HYSTERECTOMY  08/05/2013  . ORIF WRIST FRACTURE Left 01/17/2017   Procedure: OPEN REDUCTION INTERNAL FIXATION (ORIF) WRIST FRACTURE;  Surgeon: Lovell Sheehan, MD;  Location: [facility] ORS;  Service: Orthopedics;  Laterality: Left;  .  TUBAL LIGATION Bilateral   . VAGINAL HYSTERECTOMY  03/2006    There were no vitals filed for this visit.  Subjective Assessment - 02/14/18 1217    Subjective  Patient reports she is feeling well today. States she has not had tingling in leg since last session. States she continues to notice improvement with ADLs at home.     Pertinent History  Pt is a 57 y/o F who presents with BLE weakness and difficulty ambulating due to h/o MS that was diagnosed in 1995. Pt denies any pain associated. Pt was admitted to the hospital in November 2018 due to MS exacerbation. Nov-Dec to HHPT-->[facility] until end of Jan. After rehab the pt was able to ambulate up to 100 ft with the RW. Pt has Bil AFO, her therapist at [place] thinks she needs a new AFO as her feet still drag when she ambulates, pt reports HHPT mentioned a spring AFO. Pt has custom AFOs that were originally made 4 years ago. Goals: improve balance, to be able to get into the shower with her tub bench without assist, to be able to get up 2 steps at sister's home with railings on both sides but too far apart to reach both sides, to be able to drive adapted car. Pt is staying at other sister's house with one step to get  inside and currently picking up L leg with UEs and then stepping in. Pt steps down leading with LLE. Pt wants to work on walking side to side as this is challenging and she had not yet progressed to this at rehab. Pt able to get in/out the car with assist only to bring BLEs into the car. Hoping to drive by June or July with adapted car (already has one). Pt needs assist bringing LEs into and out of hospital bed. Pt able to perform sit>stand without assist from [facility] with a pillow on the seat for a boost. Pt denies any falls in the past 6 months. Pt has been increasing her time alone at home up to a few hours at a time to become more independent. Pt has both manual and power WC. Uses power WC at home, uses manual WC out in community. Pt able  to self propel manual WC. Pt needs assist putting pants around ankles but she can pull them up. Pt can put on her shoes but needs help getting her leg up to do so. Pt able to tie her shoelaces on her own. Pt has a tub/shower unit at home with a tub bench. No grab bars in the shower. Has hand held shower head. Pt currently taking a sponge bath. Pt has a regular height toilet with a BSC over top. Pt with h/o L wrist fx with no precautions, pt wore her soft brace to evaluation for comfort but reports her wrist has healed and is doing well following OT.     Limitations  Lifting;Standing;Walking;House hold activities    How long can you sit comfortably?  n/a    How long can you stand comfortably?  3 minutes without UE assist, limited by fatigue    How long can you walk comfortably?  100 ft, limited by fatigue    Patient Stated Goals  see above    Currently in Pain?  No/denies           5xsts- 1st attempt: 1 minutes 29 seconds from plinth table and RW. CGA. 2nd attempt terminated after 1 sit to stand and request to complete from wheelchair instead of plinth table. Difficulty with hand placement due to plinth table sectionals.   3rd attempt 36 seconds. wheel chair against mat table. 1 person holding back of wheelchair. 1 person CGA and 1 hand holding RW. Patient using BUE support 1 hand on RW and 1 hand on wheelchair.     Seated: Marches x15 each leg. Verbal cues to left knee towards ceiling.  Seated balloon taps minutes reaching in and out of BOS  Weighted ball straight arm raises 15x ( 3000Gr) to promote upright posture and strengthen core and stabilizer musculature   Weight ball rotations x10 (3000Gr ball) each direction for core strengthening and stabilization.  Weighted ball tosses 15x (3000Gr ball) x 15 to left and right x to promote pertubation's in seated position   Ice used during rest breaks to prevent increase in internal body temperature, longer duration breaks required after STS     Per patient report when discussing ADLs -ability to get in and out of car independently to drive -independently get dressed using reacher -still has difficulty and needs assistance with getting leg into bed due to hip flexion weakness -sponge baths independently  -inability to use ramp due to small step to access               PT Education - 02/14/18 1218  Education provided  Yes    Education Details  exercise technique, goals, function at home    Person(s) Educated  Patient    Methods  Explanation;Demonstration;Verbal cues    Comprehension  Verbalized understanding;Returned demonstration       PT Short Term Goals - 01/31/18 1354      PT SHORT TERM GOAL #1   Title  Pt will be completed HEP with min assist from caregiver at least 4 days/wk for improved carryover between sessions    Baseline  HEP compliant     Time  2    Period  Weeks    Status  Achieved      PT SHORT TERM GOAL #2   Title  Patient will report a worst pain of 6/10 on VAS in cervical spine to improve tolerance with ADLs and reduced symptoms with activities.     Baseline  4/30: 9/10 5/21: 0/10 pain    Time  2    Period  Weeks    Status  Achieved      PT SHORT TERM GOAL #3   Title  Patient will perform cervical rotation at least 50 degrees, flexion and extension to 70 degrees, and sidebending to 40 degrees without pain    Baseline  4/30: R 50 L 34 Rotation, R 26 L 35 sb, Extension 35 flex 35 ; 5/21: extension 46, SB R 41 L 40, rotation R 62 L 61,     Time  2    Period  Weeks    Status  Achieved        PT Long Term Goals - 02/14/18 1145      PT LONG TERM GOAL #1   Title  Pt's 5xSTS will impove to equal to or below 2 minutes to demonstrate improved balance and BLE strength    Baseline  3.02 minutes; 5/15: 1 min 30 seconds 9/26:unable to complete fully secondary to patient not feeling well    Time  6    Period  Weeks    Status  Achieved      PT LONG TERM GOAL #2   Title  Pt's 61mT will  improve to at least 0.5 m/s for improved safety ambulating in home    Baseline  0.14 m/s; 5/15: .221m  7/10: .36 m/s  8/20: .42 m/s  9/26: 0.2686m   Time  8    Period  Weeks    Status  Partially Met      PT LONG TERM GOAL #3   Title  Pt's Bil hip F strength will improve to at least 2+/5 BLEs for improved gait mechanics and safety    Baseline  L: 2/5, R: 1/5 7/10: 2/5 bilaterally  8/20: 2/5 bilat 9/26: 2/5 bilat    Time  8    Period  Weeks    Status  Partially Met      PT LONG TERM GOAL #4   Title  Pt's ABC Scale will improve to at least 40% to demonstrate improved balance    Baseline  28.13% 5/15; 24.4% 7/10: 23.4% 8/20: 22. 2% 9/26: 25.3%    Time  6    Period  Weeks    Status  Partially Met      PT LONG TERM GOAL #5   Title  Pt will be able to perform car transfer without assist to work towards pt's goal of returning to driving    Baseline  only needs help with one leg to put onto running board  to get into sisters car; 8/20: in and out of own car independently.     Time  6    Period  Weeks    Status  Achieved      PT LONG TERM GOAL #6   Title  Pt will be able to perform tub bench into/out of shower transfer without assist for improved independence with this task    Baseline  challenged with steps at this time, getting better, stands and gives sponge bath independently now  9/26: has not tried transfer yet    Time  6    Period  Weeks    Status  Partially Met      PT LONG TERM GOAL #7   Title  Patient will perform cervical rotation at least 60 degrees, flexion and extension to 70 degrees, and sidebending to 40 degrees without pain    Baseline  4/30: R 50 L 34 Rotation, R 26 L 35 sb, Extension 35 flex 35; 5/21: extension 46, SB R 41 L 40, rotation R 62 L 61,     Time  4    Period  Weeks    Status  Achieved      PT LONG TERM GOAL #8   Title  Patient will improve NDI to <28% to reduce disability to mild  percieved disability for improved quality of life.     Baseline  4/30:  56% severe disability 5/21: 10%    Time  4    Period  Weeks    Status  Achieved      PT LONG TERM GOAL  #9   TITLE  Patient will report a worst pain of 3/10 on VAS in  cervical spine  to improve tolerance with ADLs and reduced symptoms with activities.     Baseline  4/30: 9/10 5/21: 0/10 in neck     Time  4    Period  Weeks    Status  Achieved      PT LONG TERM GOAL  #10   TITLE  Pt's 5xSTS will impove to equal to or below 50 seconds to demonstrate improved balance and BLE strength    Baseline  5/15: 1 min 30.9 seconds 7/10: 64 seconds  8/20: 1 min 29 seconds with L foot frequently slipping out requiring patient to perform multiple attempts per one stand 10/10: 36 seconds    Time  8    Period  Weeks    Status  Achieved      PT LONG TERM GOAL  #11   TITLE  Patient will descend/ascend down a handicap ramp  with RW and with supervision to increase independence in home environment.     Baseline  7/10 can ascend, unable to descend ; 8/20: achieved on 8/31    Time  6    Period  Weeks    Status  Achieved      PT LONG TERM GOAL  #12   TITLE  Patient will negotiate one step ~4 inches to enter house independently to increase functional independence in natural environment.     Baseline  8/20: unable to do 9/26: patient too fatigued/not feeling well to perform    Time  8    Period  Weeks    Status  On-going            Plan - 02/14/18 1216    Clinical Impression Statement  Patient improved 5xsts to 36 seconds(previous 64 seconds) demonstrating improved functional strength and ease with rising from  chair. Patient continues to gain independence at home with improved ability to complete ADLs such as getting dressed and getting in and out of car to drive independently. Patient still has decreased hip flexion activation and strength which limits ability to complete steps and get in and out of bed independently. Will update goals next session to include standing from a standard chair height  instead of wheelchair. Patient will continue to benefit from skilled physical therapy to improve mobility and independence.     Rehab Potential  Good    PT Frequency  2x / week    PT Duration  8 weeks    PT Treatment/Interventions  ADLs/Self Care Home Management;Aquatic Therapy;Biofeedback;Cryotherapy;Electrical Stimulation;Iontophoresis 13m/ml Dexamethasone;Traction;Moist Heat;Ultrasound;DME Instruction;Gait training;Stair training;Functional mobility training;Therapeutic activities;Therapeutic exercise;Balance training;Neuromuscular re-education;Patient/family education;Orthotic Fit/Training;Wheelchair mobility training;Manual techniques;Compression bandaging;Passive range of motion;Dry needling;Energy conservation;Taping;Splinting    PT Next Visit Plan  standing exercises in new AFO    PT Home Exercise Plan  to initiate next session (please incorporate hip flexor and adductor stretches in HEP if possible -KG)    Consulted and Agree with Plan of Care  Patient       Patient will benefit from skilled therapeutic intervention in order to improve the following deficits and impairments:  Abnormal gait, Decreased activity tolerance, Decreased balance, Decreased coordination, Decreased endurance, Decreased knowledge of use of DME, Decreased mobility, Decreased range of motion, Decreased safety awareness, Decreased strength, Difficulty walking, Impaired perceived functional ability, Impaired sensation, Impaired tone, Impaired UE functional use, Improper body mechanics, Postural dysfunction  Visit Diagnosis: Muscle weakness (generalized)  Unsteadiness on feet  Other abnormalities of gait and mobility     Problem List Patient Active Problem List   Diagnosis Date Noted  . Wrist fracture 01/16/2017  . Health care maintenance 01/24/2016  . Blood pressure elevated without history of HTN 10/25/2015  . [place] 10/02/2015  . Chronic left shoulder pain 07/19/2015  . [place] 07/14/2015  . [place] 11/26/2014  . Increased body mass index 11/26/2014  . HPV test positive 11/26/2014  . Status post laparoscopic supracervical hysterectomy 11/26/2014  . Galactorrhea 11/26/2014  . Tobacco user 11/26/2014  . Back ache 05/21/2014  . Adiposity 05/21/2014  . Disordered sleep 05/21/2014  . Muscle spasticity 05/21/2014  . Calculus of kidney 12/09/2013  . Renal colic 013/12/6576 . Hypercholesteremia 08/19/2013  . Hereditary and idiopathic neuropathy 08/19/2013  . Hypercholesterolemia without hypertriglyceridemia 08/19/2013  . Bladder infection, chronic 07/25/2012  . Disorder of bladder function 07/25/2012  . Incomplete bladder emptying 07/25/2012  . Microscopic hematuria 07/25/2012   AErick Blinks SPT  This entire session was performed under direct supervision and direction of a licensed therapist/therapist assistant . I have personally read, edited and approve of the note as written.   MJanna Arch PT, DPT   02/14/2018, 2:22 PM  [place] [facility] [address] [address]Phone: [phone]  Fax:  [phone] Name: JAnnica MarinelloRamseur MRN: [id number] of Birth: 6Oct 14, 1962

## 2018-02-18 ENCOUNTER — Ambulatory Visit: Payer: PPO

## 2018-02-18 DIAGNOSIS — M6281 Muscle weakness (generalized): Secondary | ICD-10-CM

## 2018-02-18 DIAGNOSIS — R2681 Unsteadiness on feet: Secondary | ICD-10-CM

## 2018-02-18 DIAGNOSIS — R2689 Other abnormalities of gait and mobility: Secondary | ICD-10-CM

## 2018-02-18 NOTE — Therapy (Signed)
[place] MAIN [facility] SERVICES [address] [place], [place], [address] Phone: [phone]   Fax:  [phone]  Physical Therapy Treatment Physical Therapy Progress Note   Dates of reporting period  01/31/18   to   02/18/18  Patient Details  Name: Lisa Williams MRN: [id number] Date of Birth: 07-30-1960 Referring Provider (Lisa Williams): Gurney Maxin, MD   Encounter Date: 02/18/2018  Lisa Williams End of Session - 02/18/18 1056    Visit Number  [id number]    Number of Visits  64    Date for Lisa Williams Re-Evaluation  04/15/18    Authorization Type  5/10 PN start 01/31/18 (next 1/10 PN start 10/14)    Lisa Williams Start Time  1106    Lisa Williams Stop Time  1152    Lisa Williams Time Calculation (min)  46 min    Equipment Utilized During Treatment  Gait belt    Activity Tolerance  Patient limited by fatigue;Patient tolerated treatment well    Behavior During Therapy  [facility] for tasks assessed/performed       Past Medical History:  Diagnosis Date  . Abdominal pain, right upper quadrant   . Back pain   . Calculus of kidney 12/09/2013  . Chronic back pain    unspecified  . Chronic left shoulder pain 07/19/2015  . Functional disorder of bladder    other  . Galactorrhea 11/26/2014   Chronic   . Hereditary and idiopathic neuropathy 08/19/2013  . HPV test positive   . Hypercholesteremia 08/19/2013  . Incomplete bladder emptying   . Microscopic hematuria   . MS (multiple sclerosis) ([place])   . Muscle spasticity 05/21/2014  . Nonspecific findings on examination of urine    other  . Osteopenia   . Status post laparoscopic supracervical hysterectomy 11/26/2014  . Tobacco user 11/26/2014  . Wrist fracture     Past Surgical History:  Procedure Laterality Date  . bilateral tubal ligation  1996  . BREAST CYST EXCISION Left 2002  . KNEE SURGERY     right  . LAPAROSCOPIC SUPRACERVICAL HYSTERECTOMY  08/05/2013  . ORIF WRIST FRACTURE Left 01/17/2017   Procedure: OPEN REDUCTION INTERNAL FIXATION (ORIF)  WRIST FRACTURE;  Surgeon: Lovell Sheehan, MD;  Location: [facility] ORS;  Service: Orthopedics;  Laterality: Left;  . TUBAL LIGATION Bilateral   . VAGINAL HYSTERECTOMY  03/2006    There were no vitals filed for this visit.  Subjective Assessment - 02/18/18 1216    Subjective  Patient reports she has had an eventful weekend. Drove to [facility] on Friday and back independently and went to church Sunday. Has been feeling better in the colder weather. She reports having improved independence and ability to perform ADLs at home.     Pertinent History  Lisa Williams is a 57 y/o F who presents with BLE weakness and difficulty ambulating due to h/o MS that was diagnosed in 1995. Lisa Williams denies any pain associated. Lisa Williams was admitted to the hospital in November 2018 due to MS exacerbation. Nov-Dec to HHPT-->[facility] until end of Jan. After rehab the Lisa Williams was able to ambulate up to 100 ft with the RW. Lisa Williams has Bil AFO, her therapist at [facility] thinks she needs a new AFO as her feet still drag when she ambulates, Lisa Williams reports HHPT mentioned a spring AFO. Lisa Williams has custom AFOs that were originally made 4 years ago. Goals: improve balance, to be able to get into the shower with her tub bench without assist, to be able to get up  2 steps at sister's home with railings on both sides but too far apart to reach both sides, to be able to drive adapted car. Lisa Williams is staying at other sister's house with one step to get inside and currently picking up L leg with UEs and then stepping in. Lisa Williams steps down leading with LLE. Lisa Williams wants to work on walking side to side as this is challenging and she had not yet progressed to this at rehab. Lisa Williams able to get in/out the car with assist only to bring BLEs into the car. Hoping to drive by June or July with adapted car (already has one). Lisa Williams needs assist bringing LEs into and out of hospital bed. Lisa Williams able to perform sit>stand without assist from [facility] with a pillow on the seat for a boost. Lisa Williams denies any falls in the past 6  months. Lisa Williams has been increasing her time alone at home up to a few hours at a time to become more independent. Lisa Williams has both manual and power WC. Uses power WC at home, uses manual WC out in community. Lisa Williams able to self propel manual WC. Lisa Williams needs assist putting pants around ankles but she can pull them up. Lisa Williams can put on her shoes but needs help getting her leg up to do so. Lisa Williams able to tie her shoelaces on her own. Lisa Williams has a tub/shower unit at home with a tub bench. No grab bars in the shower. Has hand held shower head. Lisa Williams currently taking a sponge bath. Lisa Williams has a regular height toilet with a BSC over top. Lisa Williams with h/o L wrist fx with no precautions, Lisa Williams wore her soft brace to evaluation for comfort but reports her wrist has healed and is doing well following OT.     Limitations  Lifting;Standing;Walking;House hold activities    How long can you sit comfortably?  n/a    How long can you stand comfortably?  3 minutes without UE assist, limited by fatigue    How long can you walk comfortably?  100 ft, limited by fatigue    Patient Stated Goals  see above    Currently in Pain?  No/denies        10 MWT: .26 m/s improved hip flexion and clearance of BLE. ; performed two trials; 40 seconds each LE   ABC=26.5%  // bars: BUE support: attempt to step up onto 2" platform (airex pad) 8-10x each LE with improved hip and knee flexion allowing for improved attempts. Patient not yet able to perform complete motion. Performed two sets  Seated adduction squeezes 10x 5 second holds Seated balloon taps with upright back for postural strengthening while reaching inside and outside BOS without LOB 4 minutes.     Patient's condition has the potential to improve in response to therapy. Maximum improvement is yet to be obtained. The anticipated improvement is attainable and reasonable in a generally predictable time.  Patient reports she is more independent with her ADL performance: is able to get dressed with her reacher now,  able to get into and out of car independently to drive, and sponge bath independently. Has difficulty and needs assistance getting leg into bed due to hip flexion weakness and inability to use ramp due to small step to access.         Ice used during rest breaks to prevent increase in internal body temperature, longer duration breaks required after standing interventions.           Lisa Williams Education - 02/18/18  1056    Education provided  Yes    Education Details  exercise technique, goals, function at home     Person(s) Educated  Patient    Methods  Explanation;Demonstration;Verbal cues;Tactile cues    Comprehension  Verbalized understanding;Returned demonstration       Lisa Williams Short Term Goals - 02/18/18 1301      Lisa Williams SHORT TERM GOAL #1   Title  Lisa Williams will be completed HEP with min assist from caregiver at least 4 days/wk for improved carryover between sessions    Baseline  HEP compliant     Time  2    Period  Weeks    Status  Achieved      Lisa Williams SHORT TERM GOAL #2   Title  Patient will report a worst pain of 6/10 on VAS in cervical spine to improve tolerance with ADLs and reduced symptoms with activities.     Baseline  4/30: 9/10 5/21: 0/10 pain    Time  2    Period  Weeks    Status  Achieved      Lisa Williams SHORT TERM GOAL #3   Title  Patient will perform cervical rotation at least 50 degrees, flexion and extension to 70 degrees, and sidebending to 40 degrees without pain    Baseline  4/30: R 50 L 34 Rotation, R 26 L 35 sb, Extension 35 flex 35 ; 5/21: extension 46, SB R 41 L 40, rotation R 62 L 61,     Time  2    Period  Weeks    Status  Achieved        Lisa Williams Long Term Goals - 02/18/18 1129      Lisa Williams LONG TERM GOAL #1   Title  Lisa Williams's 5xSTS will impove to equal to or below 2 minutes to demonstrate improved balance and BLE strength    Baseline  3.02 minutes; 5/15: 1 min 30 seconds 9/26:unable to complete fully secondary to patient not feeling well    Time  6    Period  Weeks    Status   Achieved      Lisa Williams LONG TERM GOAL #2   Title  Lisa Williams's 43mT will improve to at least 0.5 m/s for improved safety ambulating in home    Baseline  0.14 m/s; 5/15: .275m  7/10: .36 m/s  8/20: .42 m/s  9/26: 0.2640m10/14: .26 m/s improved hip and knee clearance     Time  8    Period  Weeks    Status  Partially Met    Target Date  04/15/18      Lisa Williams LONG TERM GOAL #3   Title  Lisa Williams's Bil hip F strength will improve to at least 2+/5 BLEs for improved gait mechanics and safety    Baseline  L: 2/5, R: 1/5 7/10: 2/5 bilaterally  8/20: 2/5 bilat 9/26: 2/5 bilat 10/14: 2/5 bilaterally ,improved hip flexor muscle activation    Time  8    Period  Weeks    Status  Partially Met    Target Date  04/15/18      Lisa Williams LONG TERM GOAL #4   Title  Lisa Williams's ABC Scale will improve to at least 40% to demonstrate improved balance    Baseline  28.13% 5/15; 24.4% 7/10: 23.4% 8/20: 22. 2% 9/26: 25.3% 10/14: 26.5%    Time  6    Period  Weeks    Status  Partially Met    Target Date  04/15/18  Lisa Williams LONG TERM GOAL #5   Title  Lisa Williams will be able to perform car transfer without assist to work towards Lisa Williams's goal of returning to driving    Baseline  only needs help with one leg to put onto running board to get into sisters car; 8/20: in and out of own car independently.     Time  6    Period  Weeks    Status  Achieved      Additional Long Term Goals   Additional Long Term Goals  Yes      Lisa Williams LONG TERM GOAL #6   Title  Lisa Williams will be able to perform tub bench into/out of shower transfer without assist for improved independence with this task    Baseline  challenged with steps at this time, getting better, stands and gives sponge bath independently now  9/26: has not tried transfer yet    Time  6    Period  Weeks    Status  Partially Met      Lisa Williams LONG TERM GOAL #7   Title  Patient will perform cervical rotation at least 60 degrees, flexion and extension to 70 degrees, and sidebending to 40 degrees without pain    Baseline  4/30: R  50 L 34 Rotation, R 26 L 35 sb, Extension 35 flex 35; 5/21: extension 46, SB R 41 L 40, rotation R 62 L 61,     Time  4    Period  Weeks    Status  Achieved      Lisa Williams LONG TERM GOAL #8   Title  Patient will improve NDI to <28% to reduce disability to mild  percieved disability for improved quality of life.     Baseline  4/30: 56% severe disability 5/21: 10%    Time  4    Period  Weeks    Status  Achieved      Lisa Williams LONG TERM GOAL  #9   TITLE  Patient will report a worst pain of 3/10 on VAS in  cervical spine  to improve tolerance with ADLs and reduced symptoms with activities.     Baseline  4/30: 9/10 5/21: 0/10 in neck     Time  4    Period  Weeks    Status  Achieved      Lisa Williams LONG TERM GOAL  #10   TITLE  Lisa Williams's 5xSTS will impove to equal to or below 50 seconds to demonstrate improved balance and BLE strength    Baseline  5/15: 1 min 30.9 seconds 7/10: 64 seconds  8/20: 1 min 29 seconds with L foot frequently slipping out requiring patient to perform multiple attempts per one stand 10/10: 36 seconds    Time  8    Period  Weeks    Status  Achieved      Lisa Williams LONG TERM GOAL  #11   TITLE  Patient will descend/ascend down a handicap ramp  with RW and with supervision to increase independence in home environment.     Baseline  7/10 can ascend, unable to descend ; 8/20: achieved on 8/31    Time  6    Period  Weeks    Status  Achieved      Lisa Williams LONG TERM GOAL  #12   TITLE  Patient will negotiate one step ~4 inches to enter house independently to increase functional independence in natural environment.     Baseline  8/20: unable to do 9/26: patient too fatigued/not  feeling well to perform 10/14: unable to perform at this time    Time  8    Period  Weeks    Status  On-going    Target Date  04/15/18      Lisa Williams LONG TERM GOAL  #13   TITLE  Patient will perform STS from standard height surface to walker to allow patient to participate and transition in public areas when not in wheelchair.     Baseline   10/14: able to perform in W/C which is raised slightly     Time  8    Period  Weeks    Status  New    Target Date  04/15/18            Plan - 02/18/18 1302    Clinical Impression Statement  Patient demonstrates improved transition of sit to stand from wheelchair to walker, demonstrate ability to perform 5x STS in half the time of previous testing (36 seconds compared to previous 64 seconds) . Although patient time of 10 MWT has not improved, quality of movement has greatly improved with increased hip flexion and clearance of BLE and decreased reliance upon UE's. Patient continues to improve and gain independence at home with ADL's such as getting dressed and entering/exiting cars. Patient's condition has the potential to improve in response to therapy. Maximum improvement is yet to be obtained. The anticipated improvement is attainable and reasonable in a generally predictable time. Patient will continue to benefit from skilled physical therapy to improve mobility and independence.     Rehab Potential  Good    Lisa Williams Frequency  2x / week    Lisa Williams Duration  8 weeks    Lisa Williams Treatment/Interventions  ADLs/Self Care Home Management;Aquatic Therapy;Biofeedback;Cryotherapy;Electrical Stimulation;Iontophoresis 21m/ml Dexamethasone;Traction;Moist Heat;Ultrasound;DME Instruction;Gait training;Stair training;Functional mobility training;Therapeutic activities;Therapeutic exercise;Balance training;Neuromuscular re-education;Patient/family education;Orthotic Fit/Training;Wheelchair mobility training;Manual techniques;Compression bandaging;Passive range of motion;Dry needling;Energy conservation;Taping;Splinting    Lisa Williams Next Visit Plan  standing exercises in new AFO    Lisa Williams Home Exercise Plan  to initiate next session (please incorporate hip flexor and adductor stretches in HEP if possible -KG)    Consulted and Agree with Plan of Care  Patient       Patient will benefit from skilled therapeutic intervention in order  to improve the following deficits and impairments:  Abnormal gait, Decreased activity tolerance, Decreased balance, Decreased coordination, Decreased endurance, Decreased knowledge of use of DME, Decreased mobility, Decreased range of motion, Decreased safety awareness, Decreased strength, Difficulty walking, Impaired perceived functional ability, Impaired sensation, Impaired tone, Impaired UE functional use, Improper body mechanics, Postural dysfunction  Visit Diagnosis: Muscle weakness (generalized)  Unsteadiness on feet  Other abnormalities of gait and mobility     Problem List Patient Active Problem List   Diagnosis Date Noted  . Wrist fracture 01/16/2017  . Health care maintenance 01/24/2016  . Blood pressure elevated without history of HTN 10/25/2015  . Multiple sclerosis ([place] 10/02/2015  . Chronic left shoulder pain 07/19/2015  . [place] sclerosis exacerbation ([place] 07/14/2015  . MS (multiple sclerosis) ([place] 11/26/2014  . Increased body mass index 11/26/2014  . HPV test positive 11/26/2014  . Status post laparoscopic supracervical hysterectomy 11/26/2014  . Galactorrhea 11/26/2014  . Tobacco user 11/26/2014  . Back ache 05/21/2014  . Adiposity 05/21/2014  . Disordered sleep 05/21/2014  . Muscle spasticity 05/21/2014  . Calculus of kidney 12/09/2013  . Renal colic 085/88/5027 . Hypercholesteremia 08/19/2013  . Hereditary and idiopathic neuropathy 08/19/2013  . Hypercholesterolemia without hypertriglyceridemia 08/19/2013  .  Bladder infection, chronic 07/25/2012  . Disorder of bladder function 07/25/2012  . Incomplete bladder emptying 07/25/2012  . Microscopic hematuria 07/25/2012   Lisa Williams, Lisa Williams, Lisa Williams   02/18/2018, 1:03 PM  [place] MAIN [facility] SERVICES [address] [place], [place], [address] Phone: [phone]   Fax:  [phone]  Name: Lisa Williams MRN: [id number] Date of Birth: 02-08-1961

## 2018-02-21 ENCOUNTER — Ambulatory Visit: Payer: PPO

## 2018-02-21 DIAGNOSIS — M6281 Muscle weakness (generalized): Secondary | ICD-10-CM | POA: Diagnosis not present

## 2018-02-21 DIAGNOSIS — R2689 Other abnormalities of gait and mobility: Secondary | ICD-10-CM

## 2018-02-21 DIAGNOSIS — R2681 Unsteadiness on feet: Secondary | ICD-10-CM

## 2018-02-21 NOTE — Therapy (Signed)
[facility] [address] [place], [place], [address] Phone: [phone]   Fax:  [phone]  Physical Therapy Treatment  Patient Details  Name: Lisa Williams MRN: [id number] Date of Birth: 1961-01-22 Referring Provider (PT): Gurney Maxin, MD   Encounter Date: 02/21/2018  PT End of Session - 02/21/18 1551    Visit Number  29    Number of Visits  83    Date for PT Re-Evaluation  04/15/18    Authorization Type  next 1/10 PN start 10/14    PT Start Time  1600    PT Stop Time  1646    PT Time Calculation (min)  46 min    Equipment Utilized During Treatment  Gait belt    Activity Tolerance  Patient limited by fatigue;Patient tolerated treatment well    Behavior During Therapy  [facility] for tasks assessed/performed       Past Medical History:  Diagnosis Date  . Abdominal pain, right upper quadrant   . Back pain   . Calculus of kidney 12/09/2013  . Chronic back pain    unspecified  . Chronic left shoulder pain 07/19/2015  . Functional disorder of bladder    other  . Galactorrhea 11/26/2014   Chronic   . Hereditary and idiopathic neuropathy 08/19/2013  . HPV test positive   . Hypercholesteremia 08/19/2013  . Incomplete bladder emptying   . Microscopic hematuria   . MS (multiple sclerosis) ([place])   . Muscle spasticity 05/21/2014  . Nonspecific findings on examination of urine    other  . Osteopenia   . Status post laparoscopic supracervical hysterectomy 11/26/2014  . Tobacco user 11/26/2014  . Wrist fracture     Past Surgical History:  Procedure Laterality Date  . bilateral tubal ligation  1996  . BREAST CYST EXCISION Left 2002  . KNEE SURGERY     right  . LAPAROSCOPIC SUPRACERVICAL HYSTERECTOMY  08/05/2013  . ORIF WRIST FRACTURE Left 01/17/2017   Procedure: OPEN REDUCTION INTERNAL FIXATION (ORIF) WRIST FRACTURE;  Surgeon: Lovell Sheehan, MD;  Location: [facility] ORS;  Service: Orthopedics;  Laterality: Left;  .  TUBAL LIGATION Bilateral   . VAGINAL HYSTERECTOMY  03/2006    There were no vitals filed for this visit.  Subjective Assessment - 02/21/18 1605    Subjective  Patient reports her AFOs have to be reconfigured, not sure how long it will take to fix it. Is doing ok today but is tired since it is later.     Pertinent History  Pt is a 57 y/o F who presents with BLE weakness and difficulty ambulating due to h/o MS that was diagnosed in 1995. Pt denies any pain associated. Pt was admitted to the hospital in November 2018 due to MS exacerbation. Nov-Dec to HHPT-->[facility] until end of Jan. After rehab the pt was able to ambulate up to 100 ft with the RW. Pt has Bil AFO, her therapist at [place] thinks she needs a new AFO as her feet still drag when she ambulates, pt reports HHPT mentioned a spring AFO. Pt has custom AFOs that were originally made 4 years ago. Goals: improve balance, to be able to get into the shower with her tub bench without assist, to be able to get up 2 steps at sister's home with railings on both sides but too far apart to reach both sides, to be able to drive adapted car. Pt is staying at other sister's house with one step  to get inside and currently picking up L leg with UEs and then stepping in. Pt steps down leading with LLE. Pt wants to work on walking side to side as this is challenging and she had not yet progressed to this at rehab. Pt able to get in/out the car with assist only to bring BLEs into the car. Hoping to drive by June or July with adapted car (already has one). Pt needs assist bringing LEs into and out of hospital bed. Pt able to perform sit>stand without assist from [facility] with a pillow on the seat for a boost. Pt denies any falls in the past 6 months. Pt has been increasing her time alone at home up to a few hours at a time to become more independent. Pt has both manual and power WC. Uses power WC at home, uses manual WC out in community. Pt able to self propel manual  WC. Pt needs assist putting pants around ankles but she can pull them up. Pt can put on her shoes but needs help getting her leg up to do so. Pt able to tie her shoelaces on her own. Pt has a tub/shower unit at home with a tub bench. No grab bars in the shower. Has hand held shower head. Pt currently taking a sponge bath. Pt has a regular height toilet with a BSC over top. Pt with h/o L wrist fx with no precautions, pt wore her soft brace to evaluation for comfort but reports her wrist has healed and is doing well following OT.     Limitations  Lifting;Standing;Walking;House hold activities    How long can you sit comfortably?  n/a    How long can you stand comfortably?  3 minutes without UE assist, limited by fatigue    How long can you walk comfortably?  100 ft, limited by fatigue    Patient Stated Goals  see above    Currently in Pain?  No/denies      Ther-ex  Seated:  Weighted ball (3000Gr) straight arm raises with upright posture (back away from chair) 15x, horizontal rotation 15x each direction   Balloon pass with PT to challenge patient reach within and outside of BOS for core activation. Upright posture with back away from back of chair. 2 minutes; x2 trials   Seated hip flexion 10x into PT hand: muscle activation felt under hand  Seated hip abduction 10x RTB each LE.   Seated hip adduction 10x RTB each LE  Seated knee extensions, x 10 each leg, assist  to put into non weighted position     Standing:  March 10x each leg BUE support;Improved clearance of R foot with cues for knee to sky 2 sets  2" Airex toe taps x 10 each leg BUE support, cues for bringing knees to ceiling (hip flexion), unable to bring foot on top of step.    Step forward and back 5x each LE ; BUE support. Seated rest break in each side.    Patient requires use of ice between interventions to lower internal body temperature.                        PT Education - 02/21/18 1550     Education provided  Yes    Education Details  exercise technique, stability     Person(s) Educated  Patient    Methods  Explanation;Demonstration;Verbal cues    Comprehension  Verbalized understanding;Returned demonstration  PT Short Term Goals - 02/18/18 1301      PT SHORT TERM GOAL #1   Title  Pt will be completed HEP with min assist from caregiver at least 4 days/wk for improved carryover between sessions    Baseline  HEP compliant     Time  2    Period  Weeks    Status  Achieved      PT SHORT TERM GOAL #2   Title  Patient will report a worst pain of 6/10 on VAS in cervical spine to improve tolerance with ADLs and reduced symptoms with activities.     Baseline  4/30: 9/10 5/21: 0/10 pain    Time  2    Period  Weeks    Status  Achieved      PT SHORT TERM GOAL #3   Title  Patient will perform cervical rotation at least 50 degrees, flexion and extension to 70 degrees, and sidebending to 40 degrees without pain    Baseline  4/30: R 50 L 34 Rotation, R 26 L 35 sb, Extension 35 flex 35 ; 5/21: extension 46, SB R 41 L 40, rotation R 62 L 61,     Time  2    Period  Weeks    Status  Achieved        PT Long Term Goals - 02/18/18 1129      PT LONG TERM GOAL #1   Title  Pt's 5xSTS will impove to equal to or below 2 minutes to demonstrate improved balance and BLE strength    Baseline  3.02 minutes; 5/15: 1 min 30 seconds 9/26:unable to complete fully secondary to patient not feeling well    Time  6    Period  Weeks    Status  Achieved      PT LONG TERM GOAL #2   Title  Pt's 2mT will improve to at least 0.5 m/s for improved safety ambulating in home    Baseline  0.14 m/s; 5/15: .217m  7/10: .36 m/s  8/20: .42 m/s  9/26: 0.2646m10/14: .26 m/s improved hip and knee clearance     Time  8    Period  Weeks    Status  Partially Met    Target Date  04/15/18      PT LONG TERM GOAL #3   Title  Pt's Bil hip F strength will improve to at least 2+/5 BLEs for improved gait  mechanics and safety    Baseline  L: 2/5, R: 1/5 7/10: 2/5 bilaterally  8/20: 2/5 bilat 9/26: 2/5 bilat 10/14: 2/5 bilaterally ,improved hip flexor muscle activation    Time  8    Period  Weeks    Status  Partially Met    Target Date  04/15/18      PT LONG TERM GOAL #4   Title  Pt's ABC Scale will improve to at least 40% to demonstrate improved balance    Baseline  28.13% 5/15; 24.4% 7/10: 23.4% 8/20: 22. 2% 9/26: 25.3% 10/14: 26.5%    Time  6    Period  Weeks    Status  Partially Met    Target Date  04/15/18      PT LONG TERM GOAL #5   Title  Pt will be able to perform car transfer without assist to work towards pt's goal of returning to driving    Baseline  only needs help with one leg to put onto running board to get into sisters  car; 8/20: in and out of own car independently.     Time  6    Period  Weeks    Status  Achieved      Additional Long Term Goals   Additional Long Term Goals  Yes      PT LONG TERM GOAL #6   Title  Pt will be able to perform tub bench into/out of shower transfer without assist for improved independence with this task    Baseline  challenged with steps at this time, getting better, stands and gives sponge bath independently now  9/26: has not tried transfer yet    Time  6    Period  Weeks    Status  Partially Met      PT LONG TERM GOAL #7   Title  Patient will perform cervical rotation at least 60 degrees, flexion and extension to 70 degrees, and sidebending to 40 degrees without pain    Baseline  4/30: R 50 L 34 Rotation, R 26 L 35 sb, Extension 35 flex 35; 5/21: extension 46, SB R 41 L 40, rotation R 62 L 61,     Time  4    Period  Weeks    Status  Achieved      PT LONG TERM GOAL #8   Title  Patient will improve NDI to <28% to reduce disability to mild  percieved disability for improved quality of life.     Baseline  4/30: 56% severe disability 5/21: 10%    Time  4    Period  Weeks    Status  Achieved      PT LONG TERM GOAL  #9   TITLE   Patient will report a worst pain of 3/10 on VAS in  cervical spine  to improve tolerance with ADLs and reduced symptoms with activities.     Baseline  4/30: 9/10 5/21: 0/10 in neck     Time  4    Period  Weeks    Status  Achieved      PT LONG TERM GOAL  #10   TITLE  Pt's 5xSTS will impove to equal to or below 50 seconds to demonstrate improved balance and BLE strength    Baseline  5/15: 1 min 30.9 seconds 7/10: 64 seconds  8/20: 1 min 29 seconds with L foot frequently slipping out requiring patient to perform multiple attempts per one stand 10/10: 36 seconds    Time  8    Period  Weeks    Status  Achieved      PT LONG TERM GOAL  #11   TITLE  Patient will descend/ascend down a handicap ramp  with RW and with supervision to increase independence in home environment.     Baseline  7/10 can ascend, unable to descend ; 8/20: achieved on 8/31    Time  6    Period  Weeks    Status  Achieved      PT LONG TERM GOAL  #12   TITLE  Patient will negotiate one step ~4 inches to enter house independently to increase functional independence in natural environment.     Baseline  8/20: unable to do 9/26: patient too fatigued/not feeling well to perform 10/14: unable to perform at this time    Time  8    Period  Weeks    Status  On-going    Target Date  04/15/18      PT LONG TERM GOAL  #13  TITLE  Patient will perform STS from standard height surface to walker to allow patient to participate and transition in public areas when not in wheelchair.     Baseline  10/14: able to perform in W/C which is raised slightly     Time  8    Period  Weeks    Status  New    Target Date  04/15/18            Plan - 02/21/18 1654    Clinical Impression Statement  Patient requires frequent seated rest breaks due to fatigue with standing. Improved seated reach with core activation was performed with good ability to retain COM. Seated abduction and adduction interventions improved with progression of  resistance. Patient will continue to benefit from skilled physical therapy to improve mobility and independence.     Rehab Potential  Good    PT Frequency  2x / week    PT Duration  8 weeks    PT Treatment/Interventions  ADLs/Self Care Home Management;Aquatic Therapy;Biofeedback;Cryotherapy;Electrical Stimulation;Iontophoresis 43m/ml Dexamethasone;Traction;Moist Heat;Ultrasound;DME Instruction;Gait training;Stair training;Functional mobility training;Therapeutic activities;Therapeutic exercise;Balance training;Neuromuscular re-education;Patient/family education;Orthotic Fit/Training;Wheelchair mobility training;Manual techniques;Compression bandaging;Passive range of motion;Dry needling;Energy conservation;Taping;Splinting    PT Next Visit Plan  standing exercises in new AFO    PT Home Exercise Plan  to initiate next session (please incorporate hip flexor and adductor stretches in HEP if possible -KG)    Consulted and Agree with Plan of Care  Patient       Patient will benefit from skilled therapeutic intervention in order to improve the following deficits and impairments:  Abnormal gait, Decreased activity tolerance, Decreased balance, Decreased coordination, Decreased endurance, Decreased knowledge of use of DME, Decreased mobility, Decreased range of motion, Decreased safety awareness, Decreased strength, Difficulty walking, Impaired perceived functional ability, Impaired sensation, Impaired tone, Impaired UE functional use, Improper body mechanics, Postural dysfunction  Visit Diagnosis: Muscle weakness (generalized)  Unsteadiness on feet  Other abnormalities of gait and mobility     Problem List Patient Active Problem List   Diagnosis Date Noted  . Wrist fracture 01/16/2017  . Health care maintenance 01/24/2016  . Blood pressure elevated without history of HTN 10/25/2015  . Multiple sclerosis ([place] 10/02/2015  . Chronic left shoulder pain 07/19/2015  . [place] ([place] 07/14/2015  . [place] ([place]) ([place] 11/26/2014  . Increased body mass index 11/26/2014  . HPV test positive 11/26/2014  . Status post laparoscopic supracervical hysterectomy 11/26/2014  . Galactorrhea 11/26/2014  . Tobacco user 11/26/2014  . Back ache 05/21/2014  . Adiposity 05/21/2014  . Disordered sleep 05/21/2014  . Muscle spasticity 05/21/2014  . Calculus of kidney 12/09/2013  . Renal colic 077/41/2878 . Hypercholesteremia 08/19/2013  . Hereditary and idiopathic neuropathy 08/19/2013  . Hypercholesterolemia without hypertriglyceridemia 08/19/2013  . Bladder infection, chronic 07/25/2012  . Disorder of bladder function 07/25/2012  . Incomplete bladder emptying 07/25/2012  . Microscopic hematuria 07/25/2012   MJanna Arch PT, DPT   02/21/2018, 4:55 PM  [facility] [address] [place] [address]Phone: [phone]  Fax:  [phone] Name: JEulla KochanowskiRamseur MRN: [id number] of Birth: 6Jan 20, 1962

## 2018-02-25 ENCOUNTER — Ambulatory Visit: Payer: PPO

## 2018-02-25 DIAGNOSIS — M6281 Muscle weakness (generalized): Secondary | ICD-10-CM

## 2018-02-25 DIAGNOSIS — R2689 Other abnormalities of gait and mobility: Secondary | ICD-10-CM

## 2018-02-25 DIAGNOSIS — R2681 Unsteadiness on feet: Secondary | ICD-10-CM

## 2018-02-25 NOTE — Therapy (Addendum)
[place] MAIN [facility] SERVICES [address] [place], [place], [address] Phone: [phone]   Fax:  [phone]  Physical Therapy Treatment  Patient Details  Name: Lisa Williams MRN: [id number] Date of Birth: 1961/02/27 Referring Provider (PT): Gurney Maxin, MD   Encounter Date: 02/25/2018  PT End of Session - 02/25/18 1257    Visit Number  50    Number of Visits  64    Date for PT Re-Evaluation  04/15/18    Authorization Type  2/10 PN start 10/14    PT Start Time  1117    PT Stop Time  1200    PT Time Calculation (min)  43 min    Equipment Utilized During Treatment  Gait belt    Activity Tolerance  Patient limited by fatigue;Patient tolerated treatment well    Behavior During Therapy  [facility] for tasks assessed/performed       Past Medical History:  Diagnosis Date  . Abdominal pain, right upper quadrant   . Back pain   . Calculus of kidney 12/09/2013  . Chronic back pain    unspecified  . Chronic left shoulder pain 07/19/2015  . Functional disorder of bladder    other  . Galactorrhea 11/26/2014   Chronic   . Hereditary and idiopathic neuropathy 08/19/2013  . HPV test positive   . Hypercholesteremia 08/19/2013  . Incomplete bladder emptying   . Microscopic hematuria   . MS (multiple sclerosis) ([place])   . Muscle spasticity 05/21/2014  . Nonspecific findings on examination of urine    other  . Osteopenia   . Status post laparoscopic supracervical hysterectomy 11/26/2014  . Tobacco user 11/26/2014  . Wrist fracture     Past Surgical History:  Procedure Laterality Date  . bilateral tubal ligation  1996  . BREAST CYST EXCISION Left 2002  . KNEE SURGERY     right  . LAPAROSCOPIC SUPRACERVICAL HYSTERECTOMY  08/05/2013  . ORIF WRIST FRACTURE Left 01/17/2017   Procedure: OPEN REDUCTION INTERNAL FIXATION (ORIF) WRIST FRACTURE;  Surgeon: Lovell Sheehan, MD;  Location: [facility] ORS;  Service: Orthopedics;  Laterality: Left;  . TUBAL  LIGATION Bilateral   . VAGINAL HYSTERECTOMY  03/2006    There were no vitals filed for this visit.  Subjective Assessment - 02/25/18 1253    Subjective  Patient reports having a good weekend. States she has difficulty with standing up from tub bench and not getting in/out of tub because she doesn't have something to push off of. Reports she is still waiting on orthotist to call regarding AFOs.    Pertinent History  Pt is a 57 y/o F who presents with BLE weakness and difficulty ambulating due to h/o MS that was diagnosed in 1995. Pt denies any pain associated. Pt was admitted to the hospital in November 2018 due to MS exacerbation. Nov-Dec to HHPT-->[facility] until end of Jan. After rehab the pt was able to ambulate up to 100 ft with the RW. Pt has Bil AFO, her therapist at [facility] thinks she needs a new AFO as her feet still drag when she ambulates, pt reports HHPT mentioned a spring AFO. Pt has custom AFOs that were originally made 4 years ago. Goals: improve balance, to be able to get into the shower with her tub bench without assist, to be able to get up 2 steps at sister's home with railings on both sides but too far apart to reach both sides, to be able to drive adapted  car. Pt is staying at other sister's house with one step to get inside and currently picking up L leg with UEs and then stepping in. Pt steps down leading with LLE. Pt wants to work on walking side to side as this is challenging and she had not yet progressed to this at rehab. Pt able to get in/out the car with assist only to bring BLEs into the car. Hoping to drive by June or July with adapted car (already has one). Pt needs assist bringing LEs into and out of hospital bed. Pt able to perform sit>stand without assist from [facility] with a pillow on the seat for a boost. Pt denies any falls in the past 6 months. Pt has been increasing her time alone at home up to a few hours at a time to become more independent. Pt has both manual and  power WC. Uses power WC at home, uses manual WC out in community. Pt able to self propel manual WC. Pt needs assist putting pants around ankles but she can pull them up. Pt can put on her shoes but needs help getting her leg up to do so. Pt able to tie her shoelaces on her own. Pt has a tub/shower unit at home with a tub bench. No grab bars in the shower. Has hand held shower head. Pt currently taking a sponge bath. Pt has a regular height toilet with a BSC over top. Pt with h/o L wrist fx with no precautions, pt wore her soft brace to evaluation for comfort but reports her wrist has healed and is doing well following OT.     Limitations  Lifting;Standing;Walking;House hold activities    How long can you sit comfortably?  n/a    How long can you stand comfortably?  3 minutes without UE assist, limited by fatigue    How long can you walk comfortably?  100 ft, limited by fatigue    Patient Stated Goals  see above    Currently in Pain?  No/denies       Sit to stands with both hands on RW from plinth table to aid in ability to stand from tub bench without being able to push off of surface. CGA. X6. Began with plinth table raises all the way up. Gradually decreased height of plinth table between each rep.Increased difficulty when table reached 55.5cm height. Multiple rest breaks between with ice to decrease body temperature.     Seated:  Weighted ball (3000Gr) straight arm raises with upright posture (back away from chair) 15x, horizontal rotation 15x each direction    Balloon pass with PT to challenge patient reach within and outside of BOS for core activation. Upright posture from plinth 2 minutes    Seated hip flexion 10x for muscle activation   Seated upright raises unsupported raises with 3000Gr ball for core activation x15  Seated rotations with 3000gr ball unsupported x10.   Ice pack used in between exercises to prevent increase in internal body temperature and  overheating.                      PT Education - 02/25/18 1256    Education provided  Yes    Education Details  exercise technique, tub bench transfer, stability     Person(s) Educated  Patient    Methods  Explanation;Demonstration;Verbal cues    Comprehension  Verbalized understanding;Returned demonstration       PT Short Term Goals - 02/18/18 1301  PT SHORT TERM GOAL #1   Title  Pt will be completed HEP with min assist from caregiver at least 4 days/wk for improved carryover between sessions    Baseline  HEP compliant     Time  2    Period  Weeks    Status  Achieved      PT SHORT TERM GOAL #2   Title  Patient will report a worst pain of 6/10 on VAS in cervical spine to improve tolerance with ADLs and reduced symptoms with activities.     Baseline  4/30: 9/10 5/21: 0/10 pain    Time  2    Period  Weeks    Status  Achieved      PT SHORT TERM GOAL #3   Title  Patient will perform cervical rotation at least 50 degrees, flexion and extension to 70 degrees, and sidebending to 40 degrees without pain    Baseline  4/30: R 50 L 34 Rotation, R 26 L 35 sb, Extension 35 flex 35 ; 5/21: extension 46, SB R 41 L 40, rotation R 62 L 61,     Time  2    Period  Weeks    Status  Achieved        PT Long Term Goals - 02/18/18 1129      PT LONG TERM GOAL #1   Title  Pt's 5xSTS will impove to equal to or below 2 minutes to demonstrate improved balance and BLE strength    Baseline  3.02 minutes; 5/15: 1 min 30 seconds 9/26:unable to complete fully secondary to patient not feeling well    Time  6    Period  Weeks    Status  Achieved      PT LONG TERM GOAL #2   Title  Pt's 28mT will improve to at least 0.5 m/s for improved safety ambulating in home    Baseline  0.14 m/s; 5/15: .293m  7/10: .36 m/s  8/20: .42 m/s  9/26: 0.2616m10/14: .26 m/s improved hip and knee clearance     Time  8    Period  Weeks    Status  Partially Met    Target Date  04/15/18      PT  LONG TERM GOAL #3   Title  Pt's Bil hip F strength will improve to at least 2+/5 BLEs for improved gait mechanics and safety    Baseline  L: 2/5, R: 1/5 7/10: 2/5 bilaterally  8/20: 2/5 bilat 9/26: 2/5 bilat 10/14: 2/5 bilaterally ,improved hip flexor muscle activation    Time  8    Period  Weeks    Status  Partially Met    Target Date  04/15/18      PT LONG TERM GOAL #4   Title  Pt's ABC Scale will improve to at least 40% to demonstrate improved balance    Baseline  28.13% 5/15; 24.4% 7/10: 23.4% 8/20: 22. 2% 9/26: 25.3% 10/14: 26.5%    Time  6    Period  Weeks    Status  Partially Met    Target Date  04/15/18      PT LONG TERM GOAL #5   Title  Pt will be able to perform car transfer without assist to work towards pt's goal of returning to driving    Baseline  only needs help with one leg to put onto running board to get into sisters car; 8/20: in and out of own car independently.  Time  6    Period  Weeks    Status  Achieved      Additional Long Term Goals   Additional Long Term Goals  Yes      PT LONG TERM GOAL #6   Title  Pt will be able to perform tub bench into/out of shower transfer without assist for improved independence with this task    Baseline  challenged with steps at this time, getting better, stands and gives sponge bath independently now  9/26: has not tried transfer yet    Time  6    Period  Weeks    Status  Partially Met      PT LONG TERM GOAL #7   Title  Patient will perform cervical rotation at least 60 degrees, flexion and extension to 70 degrees, and sidebending to 40 degrees without pain    Baseline  4/30: R 50 L 34 Rotation, R 26 L 35 sb, Extension 35 flex 35; 5/21: extension 46, SB R 41 L 40, rotation R 62 L 61,     Time  4    Period  Weeks    Status  Achieved      PT LONG TERM GOAL #8   Title  Patient will improve NDI to <28% to reduce disability to mild  percieved disability for improved quality of life.     Baseline  4/30: 56% severe  disability 5/21: 10%    Time  4    Period  Weeks    Status  Achieved      PT LONG TERM GOAL  #9   TITLE  Patient will report a worst pain of 3/10 on VAS in  cervical spine  to improve tolerance with ADLs and reduced symptoms with activities.     Baseline  4/30: 9/10 5/21: 0/10 in neck     Time  4    Period  Weeks    Status  Achieved      PT LONG TERM GOAL  #10   TITLE  Pt's 5xSTS will impove to equal to or below 50 seconds to demonstrate improved balance and BLE strength    Baseline  5/15: 1 min 30.9 seconds 7/10: 64 seconds  8/20: 1 min 29 seconds with L foot frequently slipping out requiring patient to perform multiple attempts per one stand 10/10: 36 seconds    Time  8    Period  Weeks    Status  Achieved      PT LONG TERM GOAL  #11   TITLE  Patient will descend/ascend down a handicap ramp  with RW and with supervision to increase independence in home environment.     Baseline  7/10 can ascend, unable to descend ; 8/20: achieved on 8/31    Time  6    Period  Weeks    Status  Achieved      PT LONG TERM GOAL  #12   TITLE  Patient will negotiate one step ~4 inches to enter house independently to increase functional independence in natural environment.     Baseline  8/20: unable to do 9/26: patient too fatigued/not feeling well to perform 10/14: unable to perform at this time    Time  8    Period  Weeks    Status  On-going    Target Date  04/15/18      PT LONG TERM GOAL  #13   TITLE  Patient will perform STS from standard height surface to walker  to allow patient to participate and transition in public areas when not in wheelchair.     Baseline  10/14: able to perform in W/C which is raised slightly     Time  8    Period  Weeks    Status  New    Target Date  04/15/18            Plan - 02/25/18 1256    Clinical Impression Statement  Session focused on patient being able to stand from surface without being to push off of a surface with UE to simulate standing from tub  bench following shower. Patient had increased challenge when reached height of 55.5cm due to lower extremity weakness and activation. Patient is fearful to attempt sit to stand with 1 hand on RW and 1 hand on knee but will attempt this in future sessions. Patient will continue to benefit from skilled physical therapy to improve mobility and independence.      Rehab Potential  Good    PT Frequency  2x / week    PT Duration  8 weeks    PT Treatment/Interventions  ADLs/Self Care Home Management;Aquatic Therapy;Biofeedback;Cryotherapy;Electrical Stimulation;Iontophoresis 77m/ml Dexamethasone;Traction;Moist Heat;Ultrasound;DME Instruction;Gait training;Stair training;Functional mobility training;Therapeutic activities;Therapeutic exercise;Balance training;Neuromuscular re-education;Patient/family education;Orthotic Fit/Training;Wheelchair mobility training;Manual techniques;Compression bandaging;Passive range of motion;Dry needling;Energy conservation;Taping;Splinting    PT Next Visit Plan  standing exercises in new AFO    PT Home Exercise Plan  to initiate next session (please incorporate hip flexor and adductor stretches in HEP if possible -KG)    Consulted and Agree with Plan of Care  Patient       Patient will benefit from skilled therapeutic intervention in order to improve the following deficits and impairments:  Abnormal gait, Decreased activity tolerance, Decreased balance, Decreased coordination, Decreased endurance, Decreased knowledge of use of DME, Decreased mobility, Decreased range of motion, Decreased safety awareness, Decreased strength, Difficulty walking, Impaired perceived functional ability, Impaired sensation, Impaired tone, Impaired UE functional use, Improper body mechanics, Postural dysfunction  Visit Diagnosis: Muscle weakness (generalized)  Unsteadiness on feet  Other abnormalities of gait and mobility     Problem List Patient Active Problem List   Diagnosis Date Noted   . Wrist fracture 01/16/2017  . Health care maintenance 01/24/2016  . Blood pressure elevated without history of HTN 10/25/2015  . Multiple sclerosis ([place] 10/02/2015  . Chronic left shoulder pain 07/19/2015  . [place]ation ([place] 07/14/2015  . [place] 11/26/2014  . Increased body mass index 11/26/2014  . HPV test positive 11/26/2014  . Status post laparoscopic supracervical hysterectomy 11/26/2014  . Galactorrhea 11/26/2014  . Tobacco user 11/26/2014  . Back ache 05/21/2014  . Adiposity 05/21/2014  . Disordered sleep 05/21/2014  . Muscle spasticity 05/21/2014  . Calculus of kidney 12/09/2013  . Renal colic 030/01/2329 . Hypercholesteremia 08/19/2013  . Hereditary and idiopathic neuropathy 08/19/2013  . Hypercholesterolemia without hypertriglyceridemia 08/19/2013  . Bladder infection, chronic 07/25/2012  . Disorder of bladder function 07/25/2012  . Incomplete bladder emptying 07/25/2012  . Microscopic hematuria 07/25/2012   AErick Blinks SPT  This entire session was performed under direct supervision and direction of a licensed therapist/therapist assistant . I have personally read, edited and approve of the note as written.  MJanna Arch PT, DPT   02/25/2018, 1:14 PM  [place] [facility] [address] [address]Phone: [phone]  Fax:  [phone] Name: Lisa StrahanRamseur MRN: [id number]  of Birth: 01-01-1961

## 2018-02-26 DIAGNOSIS — G35 Multiple sclerosis: Secondary | ICD-10-CM | POA: Diagnosis not present

## 2018-02-27 ENCOUNTER — Ambulatory Visit: Payer: PPO

## 2018-02-27 DIAGNOSIS — G35 Multiple sclerosis: Secondary | ICD-10-CM | POA: Diagnosis not present

## 2018-02-27 DIAGNOSIS — M6281 Muscle weakness (generalized): Secondary | ICD-10-CM | POA: Diagnosis not present

## 2018-02-27 DIAGNOSIS — E669 Obesity, unspecified: Secondary | ICD-10-CM | POA: Diagnosis not present

## 2018-02-27 DIAGNOSIS — Z993 Dependence on wheelchair: Secondary | ICD-10-CM | POA: Diagnosis not present

## 2018-02-27 DIAGNOSIS — Z0000 Encounter for general adult medical examination without abnormal findings: Secondary | ICD-10-CM | POA: Diagnosis not present

## 2018-02-27 DIAGNOSIS — R2681 Unsteadiness on feet: Secondary | ICD-10-CM

## 2018-02-27 DIAGNOSIS — I10 Essential (primary) hypertension: Secondary | ICD-10-CM | POA: Diagnosis not present

## 2018-02-27 NOTE — Therapy (Signed)
[place] MAIN [facility] SERVICES [address] [place], [place], [address] Phone: [phone]   Fax:  [phone]  Physical Therapy Treatment  Patient Details  Name: Lisa Williams MRN: [id number] Date of Birth: 1960/09/08 Referring Provider (PT): Gurney Maxin, MD   Encounter Date: 02/27/2018  PT End of Session - 02/27/18 1104    Visit Number  51    Number of Visits  30    Date for PT Re-Evaluation  04/15/18    Authorization Type  3/10 PN start 10/14    PT Start Time  1115    PT Stop Time  1158    PT Time Calculation (min)  43 min    Equipment Utilized During Treatment  Gait belt    Activity Tolerance  Patient limited by fatigue;Patient tolerated treatment well    Behavior During Therapy  [facility] for tasks assessed/performed       Past Medical History:  Diagnosis Date  . Abdominal pain, right upper quadrant   . Back pain   . Calculus of kidney 12/09/2013  . Chronic back pain    unspecified  . Chronic left shoulder pain 07/19/2015  . Functional disorder of bladder    other  . Galactorrhea 11/26/2014   Chronic   . Hereditary and idiopathic neuropathy 08/19/2013  . HPV test positive   . Hypercholesteremia 08/19/2013  . Incomplete bladder emptying   . Microscopic hematuria   . MS (multiple sclerosis) ([place])   . Muscle spasticity 05/21/2014  . Nonspecific findings on examination of urine    other  . Osteopenia   . Status post laparoscopic supracervical hysterectomy 11/26/2014  . Tobacco user 11/26/2014  . Wrist fracture     Past Surgical History:  Procedure Laterality Date  . bilateral tubal ligation  1996  . BREAST CYST EXCISION Left 2002  . KNEE SURGERY     right  . LAPAROSCOPIC SUPRACERVICAL HYSTERECTOMY  08/05/2013  . ORIF WRIST FRACTURE Left 01/17/2017   Procedure: OPEN REDUCTION INTERNAL FIXATION (ORIF) WRIST FRACTURE;  Surgeon: Lovell Sheehan, MD;  Location: [facility] ORS;  Service: Orthopedics;  Laterality: Left;  . TUBAL  LIGATION Bilateral   . VAGINAL HYSTERECTOMY  03/2006    There were no vitals filed for this visit.  Subjective Assessment - 02/27/18 1135    Subjective  Patient reports she has been compliant with HEP. Reports no falls or LOB. Is going to the doctor later today. Has been focusing on using LE's for transfers now.     Pertinent History  Pt is a 57 y/o F who presents with BLE weakness and difficulty ambulating due to h/o MS that was diagnosed in 1995. Pt denies any pain associated. Pt was admitted to the hospital in November 2018 due to MS exacerbation. Nov-Dec to HHPT-->[facility] until end of Jan. After rehab the pt was able to ambulate up to 100 ft with the RW. Pt has Bil AFO, her therapist at [place] thinks she needs a new AFO as her feet still drag when she ambulates, pt reports HHPT mentioned a spring AFO. Pt has custom AFOs that were originally made 4 years ago. Goals: improve balance, to be able to get into the shower with her tub bench without assist, to be able to get up 2 steps at sister's home with railings on both sides but too far apart to reach both sides, to be able to drive adapted car. Pt is staying at other sister's house with one step to  get inside and currently picking up L leg with UEs and then stepping in. Pt steps down leading with LLE. Pt wants to work on walking side to side as this is challenging and she had not yet progressed to this at rehab. Pt able to get in/out the car with assist only to bring BLEs into the car. Hoping to drive by June or July with adapted car (already has one). Pt needs assist bringing LEs into and out of hospital bed. Pt able to perform sit>stand without assist from [facility] with a pillow on the seat for a boost. Pt denies any falls in the past 6 months. Pt has been increasing her time alone at home up to a few hours at a time to become more independent. Pt has both manual and power WC. Uses power WC at home, uses manual WC out in community. Pt able to self  propel manual WC. Pt needs assist putting pants around ankles but she can pull them up. Pt can put on her shoes but needs help getting her leg up to do so. Pt able to tie her shoelaces on her own. Pt has a tub/shower unit at home with a tub bench. No grab bars in the shower. Has hand held shower head. Pt currently taking a sponge bath. Pt has a regular height toilet with a BSC over top. Pt with h/o L wrist fx with no precautions, pt wore her soft brace to evaluation for comfort but reports her wrist has healed and is doing well following OT.     Limitations  Lifting;Standing;Walking;House hold activities    How long can you sit comfortably?  n/a    How long can you stand comfortably?  3 minutes without UE assist, limited by fatigue    How long can you walk comfortably?  100 ft, limited by fatigue    Patient Stated Goals  see above    Currently in Pain?  No/denies       Circuit: target muscles that affect balance and coordination. 1 minute 30 seconds each; 2 sets; 30 seconds- 1 minute rest break between session  1. Metabolic leg extensions: : at 45 second mark start lowering and raising LE's 2. Metabolic hip bridge : hold first 30 seconds, then raise up and down for rest of timed trial 3. Metabolic ab crunch: place one hand on top of the other, press through LE's hold 30 seconds then pulse up and down.  Supine hamstring stretch 2x60 seconds, Supine IT band stretch 2x 60 seconds SPT from chair to plinth x 2, CGA   Ice pack used in between exercises to prevent increase in internal body temperature and overheating.                          PT Education - 02/27/18 1103    Education provided  Yes    Education Details  exercise technique, coordination and muscle activation     Person(s) Educated  Patient    Methods  Explanation;Demonstration;Verbal cues    Comprehension  Verbalized understanding;Returned demonstration       PT Short Term Goals - 02/18/18 1301      PT  SHORT TERM GOAL #1   Title  Pt Williams be completed HEP with min assist from caregiver at least 4 days/wk for improved carryover between sessions    Baseline  HEP compliant     Time  2    Period  Weeks  Status  Achieved      PT SHORT TERM GOAL #2   Title  Patient Williams report a worst pain of 6/10 on VAS in cervical spine to improve tolerance with ADLs and reduced symptoms with activities.     Baseline  4/30: 9/10 5/21: 0/10 pain    Time  2    Period  Weeks    Status  Achieved      PT SHORT TERM GOAL #3   Title  Patient Williams perform cervical rotation at least 50 degrees, flexion and extension to 70 degrees, and sidebending to 40 degrees without pain    Baseline  4/30: R 50 L 34 Rotation, R 26 L 35 sb, Extension 35 flex 35 ; 5/21: extension 46, SB R 41 L 40, rotation R 62 L 61,     Time  2    Period  Weeks    Status  Achieved        PT Long Term Goals - 02/18/18 1129      PT LONG TERM GOAL #1   Title  Pt's 5xSTS Williams impove to equal to or below 2 minutes to demonstrate improved balance and BLE strength    Baseline  3.02 minutes; 5/15: 1 min 30 seconds 9/26:unable to complete fully secondary to patient not feeling well    Time  6    Period  Weeks    Status  Achieved      PT LONG TERM GOAL #2   Title  Pt's 72mT Williams improve to at least 0.5 m/s for improved safety ambulating in home    Baseline  0.14 m/s; 5/15: .220m  7/10: .36 m/s  8/20: .42 m/s  9/26: 0.2637m10/14: .26 m/s improved hip and knee clearance     Time  8    Period  Weeks    Status  Partially Met    Target Date  04/15/18      PT LONG TERM GOAL #3   Title  Pt's Bil hip F strength Williams improve to at least 2+/5 BLEs for improved gait mechanics and safety    Baseline  L: 2/5, R: 1/5 7/10: 2/5 bilaterally  8/20: 2/5 bilat 9/26: 2/5 bilat 10/14: 2/5 bilaterally ,improved hip flexor muscle activation    Time  8    Period  Weeks    Status  Partially Met    Target Date  04/15/18      PT LONG TERM GOAL #4   Title   Pt's ABC Scale Williams improve to at least 40% to demonstrate improved balance    Baseline  28.13% 5/15; 24.4% 7/10: 23.4% 8/20: 22. 2% 9/26: 25.3% 10/14: 26.5%    Time  6    Period  Weeks    Status  Partially Met    Target Date  04/15/18      PT LONG TERM GOAL #5   Title  Pt Williams be able to perform car transfer without assist to work towards pt's goal of returning to driving    Baseline  only needs help with one leg to put onto running board to get into sisters car; 8/20: in and out of own car independently.     Time  6    Period  Weeks    Status  Achieved      Additional Long Term Goals   Additional Long Term Goals  Yes      PT LONG TERM GOAL #6   Title  Pt Williams be able  to perform tub bench into/out of shower transfer without assist for improved independence with this task    Baseline  challenged with steps at this time, getting better, stands and gives sponge bath independently now  9/26: has not tried transfer yet    Time  6    Period  Weeks    Status  Partially Met      PT LONG TERM GOAL #7   Title  Patient Williams perform cervical rotation at least 60 degrees, flexion and extension to 70 degrees, and sidebending to 40 degrees without pain    Baseline  4/30: R 50 L 34 Rotation, R 26 L 35 sb, Extension 35 flex 35; 5/21: extension 46, SB R 41 L 40, rotation R 62 L 61,     Time  4    Period  Weeks    Status  Achieved      PT LONG TERM GOAL #8   Title  Patient Williams improve NDI to <28% to reduce disability to mild  percieved disability for improved quality of life.     Baseline  4/30: 56% severe disability 5/21: 10%    Time  4    Period  Weeks    Status  Achieved      PT LONG TERM GOAL  #9   TITLE  Patient Williams report a worst pain of 3/10 on VAS in  cervical spine  to improve tolerance with ADLs and reduced symptoms with activities.     Baseline  4/30: 9/10 5/21: 0/10 in neck     Time  4    Period  Weeks    Status  Achieved      PT LONG TERM GOAL  #10   TITLE  Pt's 5xSTS Williams  impove to equal to or below 50 seconds to demonstrate improved balance and BLE strength    Baseline  5/15: 1 min 30.9 seconds 7/10: 64 seconds  8/20: 1 min 29 seconds with L foot frequently slipping out requiring patient to perform multiple attempts per one stand 10/10: 36 seconds    Time  8    Period  Weeks    Status  Achieved      PT LONG TERM GOAL  #11   TITLE  Patient Williams descend/ascend down a handicap ramp  with RW and with supervision to increase independence in home environment.     Baseline  7/10 can ascend, unable to descend ; 8/20: achieved on 8/31    Time  6    Period  Weeks    Status  Achieved      PT LONG TERM GOAL  #12   TITLE  Patient Williams negotiate one step ~4 inches to enter house independently to increase functional independence in natural environment.     Baseline  8/20: unable to do 9/26: patient too fatigued/not feeling well to perform 10/14: unable to perform at this time    Time  8    Period  Weeks    Status  On-going    Target Date  04/15/18      PT LONG TERM GOAL  #13   TITLE  Patient Williams perform STS from standard height surface to walker to allow patient to participate and transition in public areas when not in wheelchair.     Baseline  10/14: able to perform in W/C which is raised slightly     Time  8    Period  Weeks    Status  New  Target Date  04/15/18            Plan - 02/27/18 1215    Clinical Impression Statement  Patient tolerated today's session which focused on prolonged muscle activation targeting the muscles that are affecting balance and coordination. Patient challenged with prolonged contraction of muscle activations requiring frequent rest breaks. Patient Williams continue to benefit from skilled physical therapy to improve mobility and independence.     Rehab Potential  Good    PT Frequency  2x / week    PT Duration  8 weeks    PT Treatment/Interventions  ADLs/Self Care Home Management;Aquatic  Therapy;Biofeedback;Cryotherapy;Electrical Stimulation;Iontophoresis 31m/ml Dexamethasone;Traction;Moist Heat;Ultrasound;DME Instruction;Gait training;Stair training;Functional mobility training;Therapeutic activities;Therapeutic exercise;Balance training;Neuromuscular re-education;Patient/family education;Orthotic Fit/Training;Wheelchair mobility training;Manual techniques;Compression bandaging;Passive range of motion;Dry needling;Energy conservation;Taping;Splinting    PT Next Visit Plan  standing exercises in new AFO    PT Home Exercise Plan  to initiate next session (please incorporate hip flexor and adductor stretches in HEP if possible -KG)    Consulted and Agree with Plan of Care  Patient       Patient Williams benefit from skilled therapeutic intervention in order to improve the following deficits and impairments:  Abnormal gait, Decreased activity tolerance, Decreased balance, Decreased coordination, Decreased endurance, Decreased knowledge of use of DME, Decreased mobility, Decreased range of motion, Decreased safety awareness, Decreased strength, Difficulty walking, Impaired perceived functional ability, Impaired sensation, Impaired tone, Impaired UE functional use, Improper body mechanics, Postural dysfunction  Visit Diagnosis: Muscle weakness (generalized)  Unsteadiness on feet     Problem List Patient Active Problem List   Diagnosis Date Noted  . Wrist fracture 01/16/2017  . Health care maintenance 01/24/2016  . Blood pressure elevated without history of HTN 10/25/2015  . Multiple sclerosis ([place] 10/02/2015  . Chronic left shoulder pain 07/19/2015  . [place] 07/14/2015  . MS (multiple sclerosis) ([place] 11/26/2014  . Increased body mass index 11/26/2014  . HPV test positive 11/26/2014  . Status post laparoscopic supracervical hysterectomy 11/26/2014  . Galactorrhea 11/26/2014  . Tobacco user 11/26/2014  . Back ache 05/21/2014  . Adiposity  05/21/2014  . Disordered sleep 05/21/2014  . Muscle spasticity 05/21/2014  . Calculus of kidney 12/09/2013  . Renal colic 068/12/8108 . Hypercholesteremia 08/19/2013  . Hereditary and idiopathic neuropathy 08/19/2013  . Hypercholesterolemia without hypertriglyceridemia 08/19/2013  . Bladder infection, chronic 07/25/2012  . Disorder of bladder function 07/25/2012  . Incomplete bladder emptying 07/25/2012  . Microscopic hematuria 07/25/2012   MJanna Arch PT, DPT   02/27/2018, 12:17 PM  [place] [facility] [address] [address]Phone: [phone]  Fax:  [phone] Name: JJady BraggsRamseur MRN: [id number] of Birth: [birth year]

## 2018-03-04 ENCOUNTER — Ambulatory Visit: Payer: PPO

## 2018-03-04 ENCOUNTER — Other Ambulatory Visit: Payer: Self-pay | Admitting: Internal Medicine

## 2018-03-04 DIAGNOSIS — M6281 Muscle weakness (generalized): Secondary | ICD-10-CM

## 2018-03-04 DIAGNOSIS — Z1231 Encounter for screening mammogram for malignant neoplasm of breast: Secondary | ICD-10-CM

## 2018-03-04 DIAGNOSIS — R2689 Other abnormalities of gait and mobility: Secondary | ICD-10-CM

## 2018-03-04 DIAGNOSIS — R2681 Unsteadiness on feet: Secondary | ICD-10-CM

## 2018-03-04 NOTE — Therapy (Signed)
[place] MAIN [facility] SERVICES [address] [place], [place], [address] Phone: [phone]   Fax:  [phone]  Physical Therapy Treatment  Patient Details  Name: Lisa Williams MRN: [id number] Date of Birth: 1961-01-16 Referring Provider (PT): Gurney Maxin, MD   Encounter Date: 03/04/2018  PT End of Session - 03/04/18 1255    Visit Number  52    Number of Visits  70    Date for PT Re-Evaluation  04/15/18    Authorization Type  4/10 PN start 10/14    PT Start Time  1115    PT Stop Time  1201    PT Time Calculation (min)  46 min    Equipment Utilized During Treatment  Gait belt    Activity Tolerance  Patient limited by fatigue;Patient tolerated treatment well    Behavior During Therapy  [facility] for tasks assessed/performed       Past Medical History:  Diagnosis Date  . Abdominal pain, right upper quadrant   . Back pain   . Calculus of kidney 12/09/2013  . Chronic back pain    unspecified  . Chronic left shoulder pain 07/19/2015  . Functional disorder of bladder    other  . Galactorrhea 11/26/2014   Chronic   . Hereditary and idiopathic neuropathy 08/19/2013  . HPV test positive   . Hypercholesteremia 08/19/2013  . Incomplete bladder emptying   . Microscopic hematuria   . MS (multiple sclerosis) ([place])   . Muscle spasticity 05/21/2014  . Nonspecific findings on examination of urine    other  . Osteopenia   . Status post laparoscopic supracervical hysterectomy 11/26/2014  . Tobacco user 11/26/2014  . Wrist fracture     Past Surgical History:  Procedure Laterality Date  . bilateral tubal ligation  1996  . BREAST CYST EXCISION Left 2002  . KNEE SURGERY     right  . LAPAROSCOPIC SUPRACERVICAL HYSTERECTOMY  08/05/2013  . ORIF WRIST FRACTURE Left 01/17/2017   Procedure: OPEN REDUCTION INTERNAL FIXATION (ORIF) WRIST FRACTURE;  Surgeon: Lovell Sheehan, MD;  Location: [facility] ORS;  Service: Orthopedics;  Laterality: Left;  . TUBAL  LIGATION Bilateral   . VAGINAL HYSTERECTOMY  03/2006    There were no vitals filed for this visit.  Subjective Assessment - 03/04/18 1136    Subjective  Patient participated in [employer] with her church this weekend and had a great time. Drove her sister to [facility]. Has been compliant with HEP and feels stronger with her transfers.     Pertinent History  Pt is a 57 y/o F who presents with BLE weakness and difficulty ambulating due to h/o MS that was diagnosed in 1995. Pt denies any pain associated. Pt was admitted to the hospital in November 2018 due to MS exacerbation. Nov-Dec to HHPT-->[facility] until end of Jan. After rehab the pt was able to ambulate up to 100 ft with the RW. Pt has Bil AFO, her therapist at [place] thinks she needs a new AFO as her feet still drag when she ambulates, pt reports HHPT mentioned a spring AFO. Pt has custom AFOs that were originally made 4 years ago. Goals: improve balance, to be able to get into the shower with her tub bench without assist, to be able to get up 2 steps at sister's home with railings on both sides but too far apart to reach both sides, to be able to drive adapted car. Pt is staying at other sister's house  with one step to get inside and currently picking up L leg with UEs and then stepping in. Pt steps down leading with LLE. Pt wants to work on walking side to side as this is challenging and she had not yet progressed to this at rehab. Pt able to get in/out the car with assist only to bring BLEs into the car. Hoping to drive by June or July with adapted car (already has one). Pt needs assist bringing LEs into and out of hospital bed. Pt able to perform sit>stand without assist from [facility] with a pillow on the seat for a boost. Pt denies any falls in the past 6 months. Pt has been increasing her time alone at home up to a few hours at a time to become more independent. Pt has both manual and power WC. Uses power WC at home, uses manual WC out  in community. Pt able to self propel manual WC. Pt needs assist putting pants around ankles but she can pull them up. Pt can put on her shoes but needs help getting her leg up to do so. Pt able to tie her shoelaces on her own. Pt has a tub/shower unit at home with a tub bench. No grab bars in the shower. Has hand held shower head. Pt currently taking a sponge bath. Pt has a regular height toilet with a BSC over top. Pt with h/o L wrist fx with no precautions, pt wore her soft brace to evaluation for comfort but reports her wrist has healed and is doing well following OT.     Limitations  Lifting;Standing;Walking;House hold activities    How long can you sit comfortably?  n/a    How long can you stand comfortably?  3 minutes without UE assist, limited by fatigue    How long can you walk comfortably?  100 ft, limited by fatigue    Patient Stated Goals  see above    Currently in Pain?  No/denies       Circuit: target muscles that affect balance and coordination. 1 minute 30 seconds each; 2 sets; 30 seconds- 1 minute rest break between session  1. Metabolic hooklying adductor squeeze : hold 30 second as long as possible, then perform for rest of time:   2. Metabolic pilates pulse: place hands on side of body and pulse UE's, press through LE's hold 30 seconds then pulse up and down. 3. Metabolic SAQ:  raise one leg at a time holding 3-5 seconds  Supine hamstring stretch 2x60 seconds, Supine IT band stretch 2x 60 seconds SPT from chair to plinth x 2, CGA   Ice pack used in between exercises to prevent increase in internal body temperature and overheating.                          PT Education - 03/04/18 1254    Education provided  Yes    Education Details  exercise technique, prolonged muscle activation     Person(s) Educated  Patient    Methods  Explanation;Demonstration;Verbal cues    Comprehension  Verbalized understanding;Returned demonstration       PT Short Term  Goals - 02/18/18 1301      PT SHORT TERM GOAL #1   Title  Pt will be completed HEP with min assist from caregiver at least 4 days/wk for improved carryover between sessions    Baseline  HEP compliant     Time  2    Period  Weeks    Status  Achieved      PT SHORT TERM GOAL #2   Title  Patient will report a worst pain of 6/10 on VAS in cervical spine to improve tolerance with ADLs and reduced symptoms with activities.     Baseline  4/30: 9/10 5/21: 0/10 pain    Time  2    Period  Weeks    Status  Achieved      PT SHORT TERM GOAL #3   Title  Patient will perform cervical rotation at least 50 degrees, flexion and extension to 70 degrees, and sidebending to 40 degrees without pain    Baseline  4/30: R 50 L 34 Rotation, R 26 L 35 sb, Extension 35 flex 35 ; 5/21: extension 46, SB R 41 L 40, rotation R 62 L 61,     Time  2    Period  Weeks    Status  Achieved        PT Long Term Goals - 02/18/18 1129      PT LONG TERM GOAL #1   Title  Pt's 5xSTS will impove to equal to or below 2 minutes to demonstrate improved balance and BLE strength    Baseline  3.02 minutes; 5/15: 1 min 30 seconds 9/26:unable to complete fully secondary to patient not feeling well    Time  6    Period  Weeks    Status  Achieved      PT LONG TERM GOAL #2   Title  Pt's 84mT will improve to at least 0.5 m/s for improved safety ambulating in home    Baseline  0.14 m/s; 5/15: .23m  7/10: .36 m/s  8/20: .42 m/s  9/26: 0.2673m10/14: .26 m/s improved hip and knee clearance     Time  8    Period  Weeks    Status  Partially Met    Target Date  04/15/18      PT LONG TERM GOAL #3   Title  Pt's Bil hip F strength will improve to at least 2+/5 BLEs for improved gait mechanics and safety    Baseline  L: 2/5, R: 1/5 7/10: 2/5 bilaterally  8/20: 2/5 bilat 9/26: 2/5 bilat 10/14: 2/5 bilaterally ,improved hip flexor muscle activation    Time  8    Period  Weeks    Status  Partially Met    Target Date  04/15/18      PT  LONG TERM GOAL #4   Title  Pt's ABC Scale will improve to at least 40% to demonstrate improved balance    Baseline  28.13% 5/15; 24.4% 7/10: 23.4% 8/20: 22. 2% 9/26: 25.3% 10/14: 26.5%    Time  6    Period  Weeks    Status  Partially Met    Target Date  04/15/18      PT LONG TERM GOAL #5   Title  Pt will be able to perform car transfer without assist to work towards pt's goal of returning to driving    Baseline  only needs help with one leg to put onto running board to get into sisters car; 8/20: in and out of own car independently.     Time  6    Period  Weeks    Status  Achieved      Additional Long Term Goals   Additional Long Term Goals  Yes      PT LONG TERM GOAL #6   Title  Pt will be able to perform tub bench into/out of shower transfer without assist for improved independence with this task    Baseline  challenged with steps at this time, getting better, stands and gives sponge bath independently now  9/26: has not tried transfer yet    Time  6    Period  Weeks    Status  Partially Met      PT LONG TERM GOAL #7   Title  Patient will perform cervical rotation at least 60 degrees, flexion and extension to 70 degrees, and sidebending to 40 degrees without pain    Baseline  4/30: R 50 L 34 Rotation, R 26 L 35 sb, Extension 35 flex 35; 5/21: extension 46, SB R 41 L 40, rotation R 62 L 61,     Time  4    Period  Weeks    Status  Achieved      PT LONG TERM GOAL #8   Title  Patient will improve NDI to <28% to reduce disability to mild  percieved disability for improved quality of life.     Baseline  4/30: 56% severe disability 5/21: 10%    Time  4    Period  Weeks    Status  Achieved      PT LONG TERM GOAL  #9   TITLE  Patient will report a worst pain of 3/10 on VAS in  cervical spine  to improve tolerance with ADLs and reduced symptoms with activities.     Baseline  4/30: 9/10 5/21: 0/10 in neck     Time  4    Period  Weeks    Status  Achieved      PT LONG TERM GOAL   #10   TITLE  Pt's 5xSTS will impove to equal to or below 50 seconds to demonstrate improved balance and BLE strength    Baseline  5/15: 1 min 30.9 seconds 7/10: 64 seconds  8/20: 1 min 29 seconds with L foot frequently slipping out requiring patient to perform multiple attempts per one stand 10/10: 36 seconds    Time  8    Period  Weeks    Status  Achieved      PT LONG TERM GOAL  #11   TITLE  Patient will descend/ascend down a handicap ramp  with RW and with supervision to increase independence in home environment.     Baseline  7/10 can ascend, unable to descend ; 8/20: achieved on 8/31    Time  6    Period  Weeks    Status  Achieved      PT LONG TERM GOAL  #12   TITLE  Patient will negotiate one step ~4 inches to enter house independently to increase functional independence in natural environment.     Baseline  8/20: unable to do 9/26: patient too fatigued/not feeling well to perform 10/14: unable to perform at this time    Time  8    Period  Weeks    Status  On-going    Target Date  04/15/18      PT LONG TERM GOAL  #13   TITLE  Patient will perform STS from standard height surface to walker to allow patient to participate and transition in public areas when not in wheelchair.     Baseline  10/14: able to perform in W/C which is raised slightly     Time  8    Period  Weeks  Status  New    Target Date  04/15/18            Plan - 03/04/18 1300    Clinical Impression Statement  Patient fatigued quickly with short arc quads indicating poor capacity for functional mobility. Patient demonstrated improved core activation with decreased need for rest breaks. Patient will continue to benefit from skilled physical therapy to improve mobility and independence.    Rehab Potential  Good    PT Frequency  2x / week    PT Duration  8 weeks    PT Treatment/Interventions  ADLs/Self Care Home Management;Aquatic Therapy;Biofeedback;Cryotherapy;Electrical Stimulation;Iontophoresis 41m/ml  Dexamethasone;Traction;Moist Heat;Ultrasound;DME Instruction;Gait training;Stair training;Functional mobility training;Therapeutic activities;Therapeutic exercise;Balance training;Neuromuscular re-education;Patient/family education;Orthotic Fit/Training;Wheelchair mobility training;Manual techniques;Compression bandaging;Passive range of motion;Dry needling;Energy conservation;Taping;Splinting    PT Next Visit Plan  standing exercises in new AFO    PT Home Exercise Plan  to initiate next session (please incorporate hip flexor and adductor stretches in HEP if possible -KG)    Consulted and Agree with Plan of Care  Patient       Patient will benefit from skilled therapeutic intervention in order to improve the following deficits and impairments:  Abnormal gait, Decreased activity tolerance, Decreased balance, Decreased coordination, Decreased endurance, Decreased knowledge of use of DME, Decreased mobility, Decreased range of motion, Decreased safety awareness, Decreased strength, Difficulty walking, Impaired perceived functional ability, Impaired sensation, Impaired tone, Impaired UE functional use, Improper body mechanics, Postural dysfunction  Visit Diagnosis: Muscle weakness (generalized)  Unsteadiness on feet  Other abnormalities of gait and mobility     Problem List Patient Active Problem List   Diagnosis Date Noted  . Wrist fracture 01/16/2017  . Health care maintenance 01/24/2016  . Blood pressure elevated without history of HTN 10/25/2015  . Multiple sclerosis ([place] 10/02/2015  . Chronic left shoulder pain 07/19/2015  . [place]ation ([place] 07/14/2015  . [place] 11/26/2014  . Increased body mass index 11/26/2014  . HPV test positive 11/26/2014  . Status post laparoscopic supracervical hysterectomy 11/26/2014  . Galactorrhea 11/26/2014  . Tobacco user 11/26/2014  . Back ache 05/21/2014  . Adiposity 05/21/2014  . Disordered sleep  05/21/2014  . Muscle spasticity 05/21/2014  . Calculus of kidney 12/09/2013  . Renal colic 009/98/3382 . Hypercholesteremia 08/19/2013  . Hereditary and idiopathic neuropathy 08/19/2013  . Hypercholesterolemia without hypertriglyceridemia 08/19/2013  . Bladder infection, chronic 07/25/2012  . Disorder of bladder function 07/25/2012  . Incomplete bladder emptying 07/25/2012  . Microscopic hematuria 07/25/2012   MJanna Arch PT, DPT   03/04/2018, 1:01 PM  [facility] [address] [address]Phone: [phone]  Fax:  [phone] Name: Lisa Williams MRN: [id number] of Birth: [birth year]

## 2018-03-06 ENCOUNTER — Ambulatory Visit: Payer: PPO

## 2018-03-06 DIAGNOSIS — M6281 Muscle weakness (generalized): Secondary | ICD-10-CM | POA: Diagnosis not present

## 2018-03-06 DIAGNOSIS — R2681 Unsteadiness on feet: Secondary | ICD-10-CM

## 2018-03-06 DIAGNOSIS — R2689 Other abnormalities of gait and mobility: Secondary | ICD-10-CM

## 2018-03-06 NOTE — Therapy (Signed)
[place] MAIN [facility] SERVICES [address] [place], [place], [address] Phone: [phone]   Fax:  [phone]  Physical Therapy Treatment  Patient Details  Name: Lisa Williams MRN: [id number] Date of Birth: 1961/04/13 Referring Provider (PT): Gurney Maxin, MD   Encounter Date: 03/06/2018  PT End of Session - 03/06/18 1129    Visit Number  24    Number of Visits  17    Date for PT Re-Evaluation  04/15/18    Authorization Type  5/10 PN start 10/14    PT Start Time  1113    PT Stop Time  1200    PT Time Calculation (min)  47 min    Equipment Utilized During Treatment  Gait belt    Activity Tolerance  Patient limited by fatigue;Patient tolerated treatment well    Behavior During Therapy  [facility] for tasks assessed/performed       Past Medical History:  Diagnosis Date  . Abdominal pain, right upper quadrant   . Back pain   . Calculus of kidney 12/09/2013  . Chronic back pain    unspecified  . Chronic left shoulder pain 07/19/2015  . Functional disorder of bladder    other  . Galactorrhea 11/26/2014   Chronic   . Hereditary and idiopathic neuropathy 08/19/2013  . HPV test positive   . Hypercholesteremia 08/19/2013  . Incomplete bladder emptying   . Microscopic hematuria   . MS (multiple sclerosis) ([place])   . Muscle spasticity 05/21/2014  . Nonspecific findings on examination of urine    other  . Osteopenia   . Status post laparoscopic supracervical hysterectomy 11/26/2014  . Tobacco user 11/26/2014  . Wrist fracture     Past Surgical History:  Procedure Laterality Date  . bilateral tubal ligation  1996  . BREAST CYST EXCISION Left 2002  . KNEE SURGERY     right  . LAPAROSCOPIC SUPRACERVICAL HYSTERECTOMY  08/05/2013  . ORIF WRIST FRACTURE Left 01/17/2017   Procedure: OPEN REDUCTION INTERNAL FIXATION (ORIF) WRIST FRACTURE;  Surgeon: Lovell Sheehan, MD;  Location: [facility] ORS;  Service: Orthopedics;  Laterality: Left;  . TUBAL  LIGATION Bilateral   . VAGINAL HYSTERECTOMY  03/2006    There were no vitals filed for this visit.  Subjective Assessment - 03/06/18 1119    Subjective  Patient reports having an ok day.  Feeling ok, denies falls or LOB. Been compliant with HEP.     Pertinent History  Pt is a 57 y/o F who presents with BLE weakness and difficulty ambulating due to h/o MS that was diagnosed in 1995. Pt denies any pain associated. Pt was admitted to the hospital in November 2018 due to MS exacerbation. Nov-Dec to HHPT-->[facility] until end of Jan. After rehab the pt was able to ambulate up to 100 ft with the RW. Pt has Bil AFO, her therapist at [facility] thinks she needs a new AFO as her feet still drag when she ambulates, pt reports HHPT mentioned a spring AFO. Pt has custom AFOs that were originally made 4 years ago. Goals: improve balance, to be able to get into the shower with her tub bench without assist, to be able to get up 2 steps at sister's home with railings on both sides but too far apart to reach both sides, to be able to drive adapted car. Pt is staying at other sister's house with one step to get inside and currently picking up L leg with UEs and then  stepping in. Pt steps down leading with LLE. Pt wants to work on walking side to side as this is challenging and she had not yet progressed to this at rehab. Pt able to get in/out the car with assist only to bring BLEs into the car. Hoping to drive by June or July with adapted car (already has one). Pt needs assist bringing LEs into and out of hospital bed. Pt able to perform sit>stand without assist from [facility] with a pillow on the seat for a boost. Pt denies any falls in the past 6 months. Pt has been increasing her time alone at home up to a few hours at a time to become more independent. Pt has both manual and power WC. Uses power WC at home, uses manual WC out in community. Pt able to self propel manual WC. Pt needs assist putting pants around ankles but she  can pull them up. Pt can put on her shoes but needs help getting her leg up to do so. Pt able to tie her shoelaces on her own. Pt has a tub/shower unit at home with a tub bench. No grab bars in the shower. Has hand held shower head. Pt currently taking a sponge bath. Pt has a regular height toilet with a BSC over top. Pt with h/o L wrist fx with no precautions, pt wore her soft brace to evaluation for comfort but reports her wrist has healed and is doing well following OT.     Limitations  Lifting;Standing;Walking;House hold activities    How long can you sit comfortably?  n/a    How long can you stand comfortably?  3 minutes without UE assist, limited by fatigue    How long can you walk comfortably?  100 ft, limited by fatigue    Patient Stated Goals  see above    Currently in Pain?  No/denies       Ther-ex  Seated:  Weighted ball (3000Gr) straight arm raises with upright posture (back away from chair) 15x, horizontal rotation 15x each direction    Balloon pass with PT to challenge patient reach within and outside of BOS for core activation. Upright posture with back away from back of chair. 2 minutes; x2 trials    Seated hip flexion 10x into PT hand: muscle activation felt under hand   Seated hip abduction 10x RTB each LE.    Seated hip adduction 10x RTB each LE    Standing:  March 10x each leg BUE support;  Improved clearance of R foot with cues for knee to sky 2 sets    2" Airex toe taps x 6 each leg BUE support, cues for bringing knees to ceiling (hip flexion), unable to bring foot on top of step.    Ambulate in // bars with BUE support with increased step length and clearance of bilateral legs. 2x length of bars   Stomp foot on slightly raised theraband 8x each leg.     Patient requires use of ice between interventions to lower internal body temperature.                               PT Education - 03/06/18 1126    Education provided  Yes     Education Details  exercise technique, stability     Person(s) Educated  Patient    Methods  Explanation;Demonstration;Verbal cues    Comprehension  Verbalized understanding;Returned demonstration  PT Short Term Goals - 02/18/18 1301      PT SHORT TERM GOAL #1   Title  Pt will be completed HEP with min assist from caregiver at least 4 days/wk for improved carryover between sessions    Baseline  HEP compliant     Time  2    Period  Weeks    Status  Achieved      PT SHORT TERM GOAL #2   Title  Patient will report a worst pain of 6/10 on VAS in cervical spine to improve tolerance with ADLs and reduced symptoms with activities.     Baseline  4/30: 9/10 5/21: 0/10 pain    Time  2    Period  Weeks    Status  Achieved      PT SHORT TERM GOAL #3   Title  Patient will perform cervical rotation at least 50 degrees, flexion and extension to 70 degrees, and sidebending to 40 degrees without pain    Baseline  4/30: R 50 L 34 Rotation, R 26 L 35 sb, Extension 35 flex 35 ; 5/21: extension 46, SB R 41 L 40, rotation R 62 L 61,     Time  2    Period  Weeks    Status  Achieved        PT Long Term Goals - 02/18/18 1129      PT LONG TERM GOAL #1   Title  Pt's 5xSTS will impove to equal to or below 2 minutes to demonstrate improved balance and BLE strength    Baseline  3.02 minutes; 5/15: 1 min 30 seconds 9/26:unable to complete fully secondary to patient not feeling well    Time  6    Period  Weeks    Status  Achieved      PT LONG TERM GOAL #2   Title  Pt's 70mT will improve to at least 0.5 m/s for improved safety ambulating in home    Baseline  0.14 m/s; 5/15: .255m  7/10: .36 m/s  8/20: .42 m/s  9/26: 0.2678m10/14: .26 m/s improved hip and knee clearance     Time  8    Period  Weeks    Status  Partially Met    Target Date  04/15/18      PT LONG TERM GOAL #3   Title  Pt's Bil hip F strength will improve to at least 2+/5 BLEs for improved gait mechanics and safety    Baseline   L: 2/5, R: 1/5 7/10: 2/5 bilaterally  8/20: 2/5 bilat 9/26: 2/5 bilat 10/14: 2/5 bilaterally ,improved hip flexor muscle activation    Time  8    Period  Weeks    Status  Partially Met    Target Date  04/15/18      PT LONG TERM GOAL #4   Title  Pt's ABC Scale will improve to at least 40% to demonstrate improved balance    Baseline  28.13% 5/15; 24.4% 7/10: 23.4% 8/20: 22. 2% 9/26: 25.3% 10/14: 26.5%    Time  6    Period  Weeks    Status  Partially Met    Target Date  04/15/18      PT LONG TERM GOAL #5   Title  Pt will be able to perform car transfer without assist to work towards pt's goal of returning to driving    Baseline  only needs help with one leg to put onto running board to get into sisters  car; 8/20: in and out of own car independently.     Time  6    Period  Weeks    Status  Achieved      Additional Long Term Goals   Additional Long Term Goals  Yes      PT LONG TERM GOAL #6   Title  Pt will be able to perform tub bench into/out of shower transfer without assist for improved independence with this task    Baseline  challenged with steps at this time, getting better, stands and gives sponge bath independently now  9/26: has not tried transfer yet    Time  6    Period  Weeks    Status  Partially Met      PT LONG TERM GOAL #7   Title  Patient will perform cervical rotation at least 60 degrees, flexion and extension to 70 degrees, and sidebending to 40 degrees without pain    Baseline  4/30: R 50 L 34 Rotation, R 26 L 35 sb, Extension 35 flex 35; 5/21: extension 46, SB R 41 L 40, rotation R 62 L 61,     Time  4    Period  Weeks    Status  Achieved      PT LONG TERM GOAL #8   Title  Patient will improve NDI to <28% to reduce disability to mild  percieved disability for improved quality of life.     Baseline  4/30: 56% severe disability 5/21: 10%    Time  4    Period  Weeks    Status  Achieved      PT LONG TERM GOAL  #9   TITLE  Patient will report a worst pain of  3/10 on VAS in  cervical spine  to improve tolerance with ADLs and reduced symptoms with activities.     Baseline  4/30: 9/10 5/21: 0/10 in neck     Time  4    Period  Weeks    Status  Achieved      PT LONG TERM GOAL  #10   TITLE  Pt's 5xSTS will impove to equal to or below 50 seconds to demonstrate improved balance and BLE strength    Baseline  5/15: 1 min 30.9 seconds 7/10: 64 seconds  8/20: 1 min 29 seconds with L foot frequently slipping out requiring patient to perform multiple attempts per one stand 10/10: 36 seconds    Time  8    Period  Weeks    Status  Achieved      PT LONG TERM GOAL  #11   TITLE  Patient will descend/ascend down a handicap ramp  with RW and with supervision to increase independence in home environment.     Baseline  7/10 can ascend, unable to descend ; 8/20: achieved on 8/31    Time  6    Period  Weeks    Status  Achieved      PT LONG TERM GOAL  #12   TITLE  Patient will negotiate one step ~4 inches to enter house independently to increase functional independence in natural environment.     Baseline  8/20: unable to do 9/26: patient too fatigued/not feeling well to perform 10/14: unable to perform at this time    Time  8    Period  Weeks    Status  On-going    Target Date  04/15/18      PT LONG TERM GOAL  #13  TITLE  Patient will perform STS from standard height surface to walker to allow patient to participate and transition in public areas when not in wheelchair.     Baseline  10/14: able to perform in W/C which is raised slightly     Time  8    Period  Weeks    Status  New    Target Date  04/15/18            Plan - 03/06/18 1204    Clinical Impression Statement  Patient demonstrated improved clearance of bilateral LE's with static marches and ambulation in // bars. Patient requires occasional fatigue with prolonged use of ice to return energy to baseline .Patient will continue to benefit from skilled physical therapy to improve mobility and  independence.    Rehab Potential  Good    PT Frequency  2x / week    PT Duration  8 weeks    PT Treatment/Interventions  ADLs/Self Care Home Management;Aquatic Therapy;Biofeedback;Cryotherapy;Electrical Stimulation;Iontophoresis 13m/ml Dexamethasone;Traction;Moist Heat;Ultrasound;DME Instruction;Gait training;Stair training;Functional mobility training;Therapeutic activities;Therapeutic exercise;Balance training;Neuromuscular re-education;Patient/family education;Orthotic Fit/Training;Wheelchair mobility training;Manual techniques;Compression bandaging;Passive range of motion;Dry needling;Energy conservation;Taping;Splinting    PT Next Visit Plan  standing exercises in new AFO    PT Home Exercise Plan  to initiate next session (please incorporate hip flexor and adductor stretches in HEP if possible -KG)    Consulted and Agree with Plan of Care  Patient       Patient will benefit from skilled therapeutic intervention in order to improve the following deficits and impairments:  Abnormal gait, Decreased activity tolerance, Decreased balance, Decreased coordination, Decreased endurance, Decreased knowledge of use of DME, Decreased mobility, Decreased range of motion, Decreased safety awareness, Decreased strength, Difficulty walking, Impaired perceived functional ability, Impaired sensation, Impaired tone, Impaired UE functional use, Improper body mechanics, Postural dysfunction  Visit Diagnosis: Muscle weakness (generalized)  Unsteadiness on feet  Other abnormalities of gait and mobility     Problem List Patient Active Problem List   Diagnosis Date Noted  . Wrist fracture 01/16/2017  . Health care maintenance 01/24/2016  . Blood pressure elevated without history of HTN 10/25/2015  . Multiple sclerosis ([place] 10/02/2015  . Chronic left shoulder pain 07/19/2015  . Multiple sclerosis exacerbation ([place] 07/14/2015  . MS (multiple sclerosis) ([place] 11/26/2014  . Increased body mass index  11/26/2014  . HPV test positive 11/26/2014  . Status post laparoscopic supracervical hysterectomy 11/26/2014  . Galactorrhea 11/26/2014  . Tobacco user 11/26/2014  . Back ache 05/21/2014  . Adiposity 05/21/2014  . Disordered sleep 05/21/2014  . Muscle spasticity 05/21/2014  . Calculus of kidney 12/09/2013  . Renal colic 054/27/0623 . Hypercholesteremia 08/19/2013  . Hereditary and idiopathic neuropathy 08/19/2013  . Hypercholesterolemia without hypertriglyceridemia 08/19/2013  . Bladder infection, chronic 07/25/2012  . Disorder of bladder function 07/25/2012  . Incomplete bladder emptying 07/25/2012  . Microscopic hematuria 07/25/2012   MJanna Arch PT, DPT   03/06/2018, 12:09 PM  [place] [facility] [facility] [address]RH[address] [address]Phone: [phone]  Fax:  [phone] Name: Lisa Williams MRN: [id number] of Birth: [birth year]

## 2018-03-11 ENCOUNTER — Encounter: Payer: Self-pay | Admitting: Physical Therapy

## 2018-03-11 ENCOUNTER — Ambulatory Visit: Payer: PPO | Attending: Neurology | Admitting: Physical Therapy

## 2018-03-11 DIAGNOSIS — M6281 Muscle weakness (generalized): Secondary | ICD-10-CM | POA: Diagnosis not present

## 2018-03-11 DIAGNOSIS — R2689 Other abnormalities of gait and mobility: Secondary | ICD-10-CM | POA: Insufficient documentation

## 2018-03-11 DIAGNOSIS — M5412 Radiculopathy, cervical region: Secondary | ICD-10-CM | POA: Diagnosis not present

## 2018-03-11 DIAGNOSIS — R2681 Unsteadiness on feet: Secondary | ICD-10-CM

## 2018-03-11 NOTE — Therapy (Addendum)
[place] MAIN [facility] SERVICES [address] [place], [place], [address] Phone: [phone]   Fax:  [phone]  Physical Therapy Treatment  Patient Details  Name: Lisa Williams MRN: [id number] Date of Birth: Apr 06, 1961 Referring Provider (PT): Gurney Maxin, MD   Encounter Date: 03/11/2018  PT End of Session - 03/11/18 1059    Visit Number  54    Number of Visits  8    Date for PT Re-Evaluation  04/15/18    Authorization Type  6/10 PN start 10/14    PT Start Time  1100    PT Stop Time  1145    PT Time Calculation (min)  45 min    Equipment Utilized During Treatment  Gait belt    Activity Tolerance  Patient limited by fatigue;Patient tolerated treatment well    Behavior During Therapy  [facility] for tasks assessed/performed       Past Medical History:  Diagnosis Date  . Abdominal pain, right upper quadrant   . Back pain   . Calculus of kidney 12/09/2013  . Chronic back pain    unspecified  . Chronic left shoulder pain 07/19/2015  . Functional disorder of bladder    other  . Galactorrhea 11/26/2014   Chronic   . Hereditary and idiopathic neuropathy 08/19/2013  . HPV test positive   . Hypercholesteremia 08/19/2013  . Incomplete bladder emptying   . Microscopic hematuria   . MS (multiple sclerosis) ([place])   . Muscle spasticity 05/21/2014  . Nonspecific findings on examination of urine    other  . Osteopenia   . Status post laparoscopic supracervical hysterectomy 11/26/2014  . Tobacco user 11/26/2014  . Wrist fracture     Past Surgical History:  Procedure Laterality Date  . bilateral tubal ligation  1996  . BREAST CYST EXCISION Left 2002  . KNEE SURGERY     right  . LAPAROSCOPIC SUPRACERVICAL HYSTERECTOMY  08/05/2013  . ORIF WRIST FRACTURE Left 01/17/2017   Procedure: OPEN REDUCTION INTERNAL FIXATION (ORIF) WRIST FRACTURE;  Surgeon: Lovell Sheehan, MD;  Location: [facility] ORS;  Service: Orthopedics;  Laterality: Left;  . TUBAL  LIGATION Bilateral   . VAGINAL HYSTERECTOMY  03/2006    There were no vitals filed for this visit.  Subjective Assessment - 03/11/18 1111    Subjective  Patient reports she is doing well today; went to church and took her mother to a senior citizen prom. Denies any pain or falls today.     Pertinent History  Pt is a 57 y/o F who presents with BLE weakness and difficulty ambulating due to h/o MS that was diagnosed in 1995. Pt denies any pain associated. Pt was admitted to the hospital in November 2018 due to MS exacerbation. Nov-Dec to HHPT-->[facility] until end of Jan. After rehab the pt was able to ambulate up to 100 ft with the RW. Pt has Bil AFO, her therapist at [facility] thinks she needs a new AFO as her feet still drag when she ambulates, pt reports HHPT mentioned a spring AFO. Pt has custom AFOs that were originally made 4 years ago. Goals: improve balance, to be able to get into the shower with her tub bench without assist, to be able to get up 2 steps at sister's home with railings on both sides but too far apart to reach both sides, to be able to drive adapted car. Pt is staying at other sister's house with one step to get inside and currently  picking up L leg with UEs and then stepping in. Pt steps down leading with LLE. Pt wants to work on walking side to side as this is challenging and she had not yet progressed to this at rehab. Pt able to get in/out the car with assist only to bring BLEs into the car. Hoping to drive by June or July with adapted car (already has one). Pt needs assist bringing LEs into and out of hospital bed. Pt able to perform sit>stand without assist from [facility] with a pillow on the seat for a boost. Pt denies any falls in the past 6 months. Pt has been increasing her time alone at home up to a few hours at a time to become more independent. Pt has both manual and power WC. Uses power WC at home, uses manual WC out in community. Pt able to self propel manual WC. Pt needs  assist putting pants around ankles but she can pull them up. Pt can put on her shoes but needs help getting her leg up to do so. Pt able to tie her shoelaces on her own. Pt has a tub/shower unit at home with a tub bench. No grab bars in the shower. Has hand held shower head. Pt currently taking a sponge bath. Pt has a regular height toilet with a BSC over top. Pt with h/o L wrist fx with no precautions, pt wore her soft brace to evaluation for comfort but reports her wrist has healed and is doing well following OT.     Limitations  Lifting;Standing;Walking;House hold activities    How long can you sit comfortably?  n/a    How long can you stand comfortably?  3 minutes without UE assist, limited by fatigue    How long can you walk comfortably?  100 ft, limited by fatigue    Patient Stated Goals  see above    Currently in Pain?  No/denies             Treatment Seated: Weighted ball (3000Gr) straight arm raises with upright posture (back away from chair) 15x, horizontal rotation 15x each direction  Balloon pass with PT to challenge patient reach within and outside of BOS for core activation.Upright posture with back away from back of chair. 2 minutes   Standing: March 10x each leg BUE support;Improved clearance of R foot with cues for knee to sky 2 sets  2"Airextoe taps to pad x 5 each leg BUE support, cues for bringing knees to ceiling (hip flexion), unable to bring foot on top of pad but able to kick pad with forward motion  Ambulate in // bars with BUE support with increased step length and clearance of bilateral legs. 2x length of bars; increased speed noted with ambulation in // bars  Patient requires use of ice between interventions to lower internal body temperature.                 PT Education - 03/11/18 1058    Education provided  Yes    Education Details  exercise technique, stability    Person(s) Educated  Patient    Methods   Explanation;Demonstration;Verbal cues    Comprehension  Verbalized understanding;Returned demonstration       PT Short Term Goals - 02/18/18 1301      PT SHORT TERM GOAL #1   Title  Pt will be completed HEP with min assist from caregiver at least 4 days/wk for improved carryover between sessions    Baseline  HEP  compliant     Time  2    Period  Weeks    Status  Achieved      PT SHORT TERM GOAL #2   Title  Patient will report a worst pain of 6/10 on VAS in cervical spine to improve tolerance with ADLs and reduced symptoms with activities.     Baseline  4/30: 9/10 5/21: 0/10 pain    Time  2    Period  Weeks    Status  Achieved      PT SHORT TERM GOAL #3   Title  Patient will perform cervical rotation at least 50 degrees, flexion and extension to 70 degrees, and sidebending to 40 degrees without pain    Baseline  4/30: R 50 L 34 Rotation, R 26 L 35 sb, Extension 35 flex 35 ; 5/21: extension 46, SB R 41 L 40, rotation R 62 L 61,     Time  2    Period  Weeks    Status  Achieved        PT Long Term Goals - 02/18/18 1129      PT LONG TERM GOAL #1   Title  Pt's 5xSTS will impove to equal to or below 2 minutes to demonstrate improved balance and BLE strength    Baseline  3.02 minutes; 5/15: 1 min 30 seconds 9/26:unable to complete fully secondary to patient not feeling well    Time  6    Period  Weeks    Status  Achieved      PT LONG TERM GOAL #2   Title  Pt's 5mT will improve to at least 0.5 m/s for improved safety ambulating in home    Baseline  0.14 m/s; 5/15: .263m  7/10: .36 m/s  8/20: .42 m/s  9/26: 0.2661m10/14: .26 m/s improved hip and knee clearance     Time  8    Period  Weeks    Status  Partially Met    Target Date  04/15/18      PT LONG TERM GOAL #3   Title  Pt's Bil hip F strength will improve to at least 2+/5 BLEs for improved gait mechanics and safety    Baseline  L: 2/5, R: 1/5 7/10: 2/5 bilaterally  8/20: 2/5 bilat 9/26: 2/5 bilat 10/14: 2/5 bilaterally  ,improved hip flexor muscle activation    Time  8    Period  Weeks    Status  Partially Met    Target Date  04/15/18      PT LONG TERM GOAL #4   Title  Pt's ABC Scale will improve to at least 40% to demonstrate improved balance    Baseline  28.13% 5/15; 24.4% 7/10: 23.4% 8/20: 22. 2% 9/26: 25.3% 10/14: 26.5%    Time  6    Period  Weeks    Status  Partially Met    Target Date  04/15/18      PT LONG TERM GOAL #5   Title  Pt will be able to perform car transfer without assist to work towards pt's goal of returning to driving    Baseline  only needs help with one leg to put onto running board to get into sisters car; 8/20: in and out of own car independently.     Time  6    Period  Weeks    Status  Achieved      Additional Long Term Goals   Additional Long Term Goals  Yes  PT LONG TERM GOAL #6   Title  Pt will be able to perform tub bench into/out of shower transfer without assist for improved independence with this task    Baseline  challenged with steps at this time, getting better, stands and gives sponge bath independently now  9/26: has not tried transfer yet    Time  6    Period  Weeks    Status  Partially Met      PT LONG TERM GOAL #7   Title  Patient will perform cervical rotation at least 60 degrees, flexion and extension to 70 degrees, and sidebending to 40 degrees without pain    Baseline  4/30: R 50 L 34 Rotation, R 26 L 35 sb, Extension 35 flex 35; 5/21: extension 46, SB R 41 L 40, rotation R 62 L 61,     Time  4    Period  Weeks    Status  Achieved      PT LONG TERM GOAL #8   Title  Patient will improve NDI to <28% to reduce disability to mild  percieved disability for improved quality of life.     Baseline  4/30: 56% severe disability 5/21: 10%    Time  4    Period  Weeks    Status  Achieved      PT LONG TERM GOAL  #9   TITLE  Patient will report a worst pain of 3/10 on VAS in  cervical spine  to improve tolerance with ADLs and reduced symptoms with  activities.     Baseline  4/30: 9/10 5/21: 0/10 in neck     Time  4    Period  Weeks    Status  Achieved      PT LONG TERM GOAL  #10   TITLE  Pt's 5xSTS will impove to equal to or below 50 seconds to demonstrate improved balance and BLE strength    Baseline  5/15: 1 min 30.9 seconds 7/10: 64 seconds  8/20: 1 min 29 seconds with L foot frequently slipping out requiring patient to perform multiple attempts per one stand 10/10: 36 seconds    Time  8    Period  Weeks    Status  Achieved      PT LONG TERM GOAL  #11   TITLE  Patient will descend/ascend down a handicap ramp  with RW and with supervision to increase independence in home environment.     Baseline  7/10 can ascend, unable to descend ; 8/20: achieved on 8/31    Time  6    Period  Weeks    Status  Achieved      PT LONG TERM GOAL  #12   TITLE  Patient will negotiate one step ~4 inches to enter house independently to increase functional independence in natural environment.     Baseline  8/20: unable to do 9/26: patient too fatigued/not feeling well to perform 10/14: unable to perform at this time    Time  8    Period  Weeks    Status  On-going    Target Date  04/15/18      PT LONG TERM GOAL  #13   TITLE  Patient will perform STS from standard height surface to walker to allow patient to participate and transition in public areas when not in wheelchair.     Baseline  10/14: able to perform in W/C which is raised slightly     Time  8  Period  Weeks    Status  New    Target Date  04/15/18            Plan - 03/11/18 1143    Clinical Impression Statement  Patient tolerated therapy session well; requires frequent rest breaks and ice to decrease internal body temperature. Pt demonstrated improved ambulation speed in // bars with increased knee flexion. Pt demonstrated good ability to reach outside BOS when sitting with balloon passes. Pt will continue to benefit from skilled PT intervention for improvements in mobility and  independence.     Rehab Potential  Good    PT Frequency  2x / week    PT Duration  8 weeks    PT Treatment/Interventions  ADLs/Self Care Home Management;Aquatic Therapy;Biofeedback;Cryotherapy;Electrical Stimulation;Iontophoresis 10m/ml Dexamethasone;Traction;Moist Heat;Ultrasound;DME Instruction;Gait training;Stair training;Functional mobility training;Therapeutic activities;Therapeutic exercise;Balance training;Neuromuscular re-education;Patient/family education;Orthotic Fit/Training;Wheelchair mobility training;Manual techniques;Compression bandaging;Passive range of motion;Dry needling;Energy conservation;Taping;Splinting    PT Next Visit Plan  standing exercises in new AFO    PT Home Exercise Plan  to initiate next session (please incorporate hip flexor and adductor stretches in HEP if possible -KG)    Consulted and Agree with Plan of Care  Patient       Patient will benefit from skilled therapeutic intervention in order to improve the following deficits and impairments:  Abnormal gait, Decreased activity tolerance, Decreased balance, Decreased coordination, Decreased endurance, Decreased knowledge of use of DME, Decreased mobility, Decreased range of motion, Decreased safety awareness, Decreased strength, Difficulty walking, Impaired perceived functional ability, Impaired sensation, Impaired tone, Impaired UE functional use, Improper body mechanics, Postural dysfunction  Visit Diagnosis: Muscle weakness (generalized)  Unsteadiness on feet  Other abnormalities of gait and mobility     Problem List Patient Active Problem List   Diagnosis Date Noted  . Wrist fracture 01/16/2017  . Health care maintenance 01/24/2016  . Blood pressure elevated without history of HTN 10/25/2015  . Multiple sclerosis ([place] 10/02/2015  . Chronic left shoulder pain 07/19/2015  . Multiple sclerosis exacerbation ([place] 07/14/2015  . MS (multiple sclerosis) ([place] 11/26/2014  . Increased body mass index  11/26/2014  . HPV test positive 11/26/2014  . Status post laparoscopic supracervical hysterectomy 11/26/2014  . Galactorrhea 11/26/2014  . Tobacco user 11/26/2014  . Back ache 05/21/2014  . Adiposity 05/21/2014  . Disordered sleep 05/21/2014  . Muscle spasticity 05/21/2014  . Calculus of kidney 12/09/2013  . Renal colic 093/71/6967 . Hypercholesteremia 08/19/2013  . Hereditary and idiopathic neuropathy 08/19/2013  . Hypercholesterolemia without hypertriglyceridemia 08/19/2013  . Bladder infection, chronic 07/25/2012  . Disorder of bladder function 07/25/2012  . Incomplete bladder emptying 07/25/2012  . Microscopic hematuria 07/25/2012   KHarriet Masson SPT This entire session was performed under direct supervision and direction of a licensed therapist/therapist assistant . I have personally read, edited and approve of the note as written.  Trotter,Margaret PT, DPT 03/11/2018, 1:43 PM  [place] [facility] [address] [address]Phone: [phone]  Fax:  [phone] Name: JSamina WeekesRamseur MRN: [id number] of Birth: 611/25/1962

## 2018-03-12 ENCOUNTER — Ambulatory Visit (INDEPENDENT_AMBULATORY_CARE_PROVIDER_SITE_OTHER): Payer: PPO | Admitting: Urology

## 2018-03-12 ENCOUNTER — Encounter: Payer: Self-pay | Admitting: Urology

## 2018-03-12 VITALS — BP 163/80 | HR 77 | Ht 71.0 in

## 2018-03-12 DIAGNOSIS — N3281 Overactive bladder: Secondary | ICD-10-CM

## 2018-03-12 DIAGNOSIS — M654 Radial styloid tenosynovitis [de Quervain]: Secondary | ICD-10-CM | POA: Insufficient documentation

## 2018-03-12 DIAGNOSIS — N390 Urinary tract infection, site not specified: Secondary | ICD-10-CM

## 2018-03-12 LAB — BLADDER SCAN AMB NON-IMAGING

## 2018-03-12 MED ORDER — OXYBUTYNIN CHLORIDE ER 10 MG PO TB24: 10.0000 mg | ORAL_TABLET | Freq: Every day | ORAL | 3 refills | Status: DC

## 2018-03-12 NOTE — Progress Notes (Signed)
03/12/2018 3:47 PM   Lisa Williams [birth year] [id number]  Referring provider: Lauro Regulus, MD [address] [facility] [place], [place] [address]  Chief Complaint  Patient presents with  . Cystitis    New patient    HPI: 57 yo F with history of MS who presents today to establish care for her OAB symptoms.  She is previously followed by Dr. Achilles Dunk at [facility] urology.  She was last seen in 2017.  She was previously seen on annual basis.  In 2018, she fractured her wrist and was in rehabilitation for quite some time learning how to walk again.  As such, she was not seen last year.  She is currently managed on Ditropan.  She is currently taking ditropan 5 mg XL taking two tablets daily.  Helps with her history of bladder spasms.  She does feel like she is emptying her bladder completely.  She has rare episodes of incontinence.  She is overall pleased.  If he has side effects with this medication.  She does have a history of recurrent UTI.  Last infection over a year ago resulting in a fall/ wrist fracture.    She denies any dysuria or gross hematuria today.  PMH: Past Medical History:  Diagnosis Date  . Abdominal pain, right upper quadrant   . Back pain   . Calculus of kidney 12/09/2013  . Chronic back pain    unspecified  . Chronic left shoulder pain 07/19/2015  . Functional disorder of bladder    other  . Galactorrhea 11/26/2014   Chronic   . Hereditary and idiopathic neuropathy 08/19/2013  . HPV test positive   . Hypercholesteremia 08/19/2013  . Incomplete bladder emptying   . Microscopic hematuria   . MS (multiple sclerosis) ([facility])   . Muscle spasticity 05/21/2014  . Nonspecific findings on examination of urine    other  . Osteopenia   . Status post laparoscopic supracervical hysterectomy 11/26/2014  . Tobacco user 11/26/2014  . Wrist fracture     Surgical History: Past Surgical History:  Procedure Laterality Date  . bilateral tubal  ligation  1996  . BREAST CYST EXCISION Left 2002  . KNEE SURGERY     right  . LAPAROSCOPIC SUPRACERVICAL HYSTERECTOMY  08/05/2013  . ORIF WRIST FRACTURE Left 01/17/2017   Procedure: OPEN REDUCTION INTERNAL FIXATION (ORIF) WRIST FRACTURE;  Surgeon: Lyndle Herrlich, MD;  Location: [facility] ORS;  Service: Orthopedics;  Laterality: Left;  . TUBAL LIGATION Bilateral   . VAGINAL HYSTERECTOMY  03/2006    Home Medications:  Allergies as of 03/12/2018      Reactions   Amlodipine Other (See Comments)   edema   Povidone Iodine Rash      Medication List        Accurate as of 03/12/18 11:59 PM. Always use your most recent med list.          alendronate 70 MG tablet Commonly known as:  FOSAMAX Take by mouth.   baclofen 20 MG tablet Commonly known as:  LIORESAL Take 20 mg by mouth every 8 (eight) hours as needed for muscle spasms.   CALCIUM 600+D 600-800 MG-UNIT Tabs Generic drug:  Calcium Carb-Cholecalciferol Take 1 tablet by mouth daily.   multivitamin with minerals tablet Take by mouth.   oxybutynin 5 MG 24 hr tablet Commonly known as:  DITROPAN-XL   oxybutynin 10 MG 24 hr tablet Commonly known as:  DITROPAN-XL Take 1 tablet (10 mg total) by  mouth daily.   RITUXIMAB IV Pt uses every 6 months due October 2018   telmisartan 40 MG tablet Commonly known as:  MICARDIS Take 40 mg by mouth daily.       Allergies:  Allergies  Allergen Reactions  . Amlodipine Other (See Comments)    edema  . Povidone Iodine Rash    Family History: Family History  Problem Relation Age of Onset  . Sickle cell trait Sister   . Hypertension Sister   . Supraventricular tachycardia Sister   . Hypertension Sister   . Diabetes Maternal Grandmother   . Liver cancer Maternal Grandmother   . Diabetes Maternal Aunt   . Breast cancer Maternal Aunt        great MAT  . Ovarian cancer Paternal Grandmother   . Nephrolithiasis Father   . Hypertension Father   . Prostate cancer Father   . Kidney  disease Father   . Heart failure Father   . Hypertension Sister   . Osteoarthritis Mother   . Hypertension Mother   . Hyperlipidemia Mother   . GU problems Neg Hx   . Urolithiasis Neg Hx     Social History:  reports that she has quit smoking. Her smoking use included cigarettes. She smoked 0.50 packs per day. She has never used smokeless tobacco. She reports that she does not drink alcohol or use drugs.  ROS: UROLOGY Frequent Urination?: No Hard to postpone urination?: No Burning/pain with urination?: No Get up at night to urinate?: No Leakage of urine?: No Urine stream starts and stops?: No Trouble starting stream?: No Do you have to strain to urinate?: No Blood in urine?: No Urinary tract infection?: No Sexually transmitted disease?: No Injury to kidneys or bladder?: No Painful intercourse?: No Weak stream?: No Currently pregnant?: No Vaginal bleeding?: No Last menstrual period?: Hysterectomy  Gastrointestinal Nausea?: No Vomiting?: No Indigestion/heartburn?: No Diarrhea?: No Constipation?: No  Constitutional Fever: No Night sweats?: No Weight loss?: No Fatigue?: No  Skin Skin rash/lesions?: No Itching?: No  Eyes Blurred vision?: No Double vision?: No  Ears/Nose/Throat Sore throat?: No Sinus problems?: No  Hematologic/Lymphatic Swollen glands?: No Easy bruising?: No  Cardiovascular Leg swelling?: No Chest pain?: No  Respiratory Cough?: No Shortness of breath?: No  Endocrine Excessive thirst?: No  Musculoskeletal Back pain?: Yes Joint pain?: No  Neurological Headaches?: No Dizziness?: No  Psychologic Depression?: No Anxiety?: No  Physical Exam: BP (!) 163/80 (BP Location: Left Arm, Patient Position: Sitting, Cuff Size: Large)   Pulse 77   Ht 5\' 11"  (1.803 m)   BMI 36.27 kg/m   Constitutional:  Alert and oriented, No acute distress. In wheelchair.   HEENT: [place] AT, moist mucus membranes.  Trachea midline, no  masses. Respiratory: Normal respiratory effort, no increased work of breathing. GI: Abdomen is soft, nontender, nondistended, no abdominal masses, obese. GU: No CVA tenderness Skin: No rashes, bruises or suspicious lesions. Neurologic: Grossly intact, no focal deficits, moving all 4 extremities. Psychiatric: Normal mood and affect.  Laboratory Data: Lab Results  Component Value Date   WBC 7.6 03/06/2017   HGB 12.5 03/06/2017   HCT 36.9 03/06/2017   MCV 79.6 (L) 03/06/2017   PLT 213 03/06/2017    Lab Results  Component Value Date   CREATININE 0.67 03/12/2017    Lab Results  Component Value Date   HGBA1C 5.1 11/29/2016    Urinalysis Unable to provide specimen today, otherwise asymptomatic  Pertinent Imaging: Results for orders placed or performed in visit on  03/12/18  Bladder Scan (Post Void Residual) in office  Result Value Ref Range   Scan Result 58mL      Assessment & Plan:    1. OAB (overactive bladder) Likely secondary to MS Symptoms well controlled on oxybutynin 5 mg XL twice daily We discussed that given that this is the XL version, we could just increase her dose to 10 mg XL daily which would serve the same purpose for ease and compliance She is agreeable this plan Prescription sent to pharmacy Continue annual follow-up Adequate bladder emptying today - Bladder Scan (Post Void Residual) in office  2. Recurrent UTI Personal history of recurrent urinary tract infections No infection for the last year No further intervention at this time Advised to call for same-day nurse visit for a catheterized specimen if she has signs or symptoms of infection She is agreeable this plan - Bladder Scan (Post Void Residual) in office   Return in about 1 year (around 03/13/2019) for recheck urinary symptoms/ PVR.  Vanna Scotland, MD  [facility] [address], [address] [place], [place] [address] [phone]

## 2018-03-13 ENCOUNTER — Ambulatory Visit: Payer: PPO

## 2018-03-13 DIAGNOSIS — R2681 Unsteadiness on feet: Secondary | ICD-10-CM

## 2018-03-13 DIAGNOSIS — M6281 Muscle weakness (generalized): Secondary | ICD-10-CM | POA: Diagnosis not present

## 2018-03-13 DIAGNOSIS — R2689 Other abnormalities of gait and mobility: Secondary | ICD-10-CM

## 2018-03-13 NOTE — Therapy (Addendum)
[place] MAIN [facility] SERVICES [address] [place], [place], [address] Phone: [phone]   Fax:  [phone]  Physical Therapy Treatment  Patient Details  Name: Lisa Williams MRN: [id number] Date of Birth: Jan 30, 1961 Referring Provider (PT): Gurney Maxin, MD   Encounter Date: 03/13/2018  PT End of Session - 03/13/18 1204    Visit Number  55    Number of Visits  25    Date for PT Re-Evaluation  04/15/18    Authorization Type  7/10 PN start 10/14    PT Start Time  1115    PT Stop Time  1200    PT Time Calculation (min)  45 min    Equipment Utilized During Treatment  Gait belt    Activity Tolerance  Patient limited by fatigue;Patient tolerated treatment well    Behavior During Therapy  [facility] for tasks assessed/performed       Past Medical History:  Diagnosis Date  . Abdominal pain, right upper quadrant   . Back pain   . Calculus of kidney 12/09/2013  . Chronic back pain    unspecified  . Chronic left shoulder pain 07/19/2015  . Functional disorder of bladder    other  . Galactorrhea 11/26/2014   Chronic   . Hereditary and idiopathic neuropathy 08/19/2013  . HPV test positive   . Hypercholesteremia 08/19/2013  . Incomplete bladder emptying   . Microscopic hematuria   . MS (multiple sclerosis) ([place])   . Muscle spasticity 05/21/2014  . Nonspecific findings on examination of urine    other  . Osteopenia   . Status post laparoscopic supracervical hysterectomy 11/26/2014  . Tobacco user 11/26/2014  . Wrist fracture     Past Surgical History:  Procedure Laterality Date  . bilateral tubal ligation  1996  . BREAST CYST EXCISION Left 2002  . KNEE SURGERY     right  . LAPAROSCOPIC SUPRACERVICAL HYSTERECTOMY  08/05/2013  . ORIF WRIST FRACTURE Left 01/17/2017   Procedure: OPEN REDUCTION INTERNAL FIXATION (ORIF) WRIST FRACTURE;  Surgeon: Lovell Sheehan, MD;  Location: [facility] ORS;  Service: Orthopedics;  Laterality: Left;  . TUBAL  LIGATION Bilateral   . VAGINAL HYSTERECTOMY  03/2006    There were no vitals filed for this visit.  Subjective Assessment - 03/13/18 1123    Subjective  Patient states she is feeling good. Thinks she has been feeling better due to weather. States she was sick Monday afternoon but was able to sleep it off. Denies falls and pain since last visit. States she thinks she is stronger in thighs and legs and she can tell a difference in her walking.  States she does not need pelvic health PT at this time.     Pertinent History  Pt is a 57 y/o F who presents with BLE weakness and difficulty ambulating due to h/o MS that was diagnosed in 1995. Pt denies any pain associated. Pt was admitted to the hospital in November 2018 due to MS exacerbation. Nov-Dec to HHPT-->[facility] until end of Jan. After rehab the pt was able to ambulate up to 100 ft with the RW. Pt has Bil AFO, her therapist at [place] thinks she needs a new AFO as her feet still drag when she ambulates, pt reports HHPT mentioned a spring AFO. Pt has custom AFOs that were originally made 4 years ago. Goals: improve balance, to be able to get into the shower with her tub bench without assist, to be able to get  up 2 steps at sister's home with railings on both sides but too far apart to reach both sides, to be able to drive adapted car. Pt is staying at other sister's house with one step to get inside and currently picking up L leg with UEs and then stepping in. Pt steps down leading with LLE. Pt wants to work on walking side to side as this is challenging and she had not yet progressed to this at rehab. Pt able to get in/out the car with assist only to bring BLEs into the car. Hoping to drive by June or July with adapted car (already has one). Pt needs assist bringing LEs into and out of hospital bed. Pt able to perform sit>stand without assist from [facility] with a pillow on the seat for a boost. Pt denies any falls in the past 6 months. Pt has been  increasing her time alone at home up to a few hours at a time to become more independent. Pt has both manual and power WC. Uses power WC at home, uses manual WC out in community. Pt able to self propel manual WC. Pt needs assist putting pants around ankles but she can pull them up. Pt can put on her shoes but needs help getting her leg up to do so. Pt able to tie her shoelaces on her own. Pt has a tub/shower unit at home with a tub bench. No grab bars in the shower. Has hand held shower head. Pt currently taking a sponge bath. Pt has a regular height toilet with a BSC over top. Pt with h/o L wrist fx with no precautions, pt wore her soft brace to evaluation for comfort but reports her wrist has healed and is doing well following OT.     Limitations  Lifting;Standing;Walking;House hold activities    How long can you sit comfortably?  n/a    How long can you stand comfortably?  3 minutes without UE assist, limited by fatigue    How long can you walk comfortably?  100 ft, limited by fatigue    Patient Stated Goals  see above    Currently in Pain?  No/denies            Treatment Seated:  Weighted ball (3000Gr) straight arm raises with upright posture (back away from chair) 15x, horizontal rotation 12x each direction, x15 tosses   Balloon pass with PT to challenge patient reach within and outside of BOS for core activation. Upright posture with back away from back of chair. 2 minutes   Seated marches x10 each LE into therapist hands. Verbal cues to lift leg straight up.   Seated adduction squeezes against therapists hands. x10   Standing:   March 10x each leg BUE support;  Improved clearance of R foot with cues for knee to ceiling   2" Airex toe taps to pad x 5 each leg BUE support, cues for bringing knees to ceiling (hip flexion), unable to bring foot on top of pad but able to kick pad with forward motion   Ambulate in // bars with BUE support with increased step length and clearance of  bilateral legs. 3x length of bars. Completed again 2nd time 2 length of bars. Verbal cues to lift knees towards the ceiling for foot clearance. Verbal cues for weight shift to offload LE to allow for swing phase   Patient requires use of ice between interventions to lower internal body temperature.  PT Education - 03/13/18 1204    Education provided  Yes    Education Details  exercise technique, stability     Person(s) Educated  Patient    Methods  Explanation;Demonstration;Verbal cues    Comprehension  Verbalized understanding;Returned demonstration       PT Short Term Goals - 02/18/18 1301      PT SHORT TERM GOAL #1   Title  Pt will be completed HEP with min assist from caregiver at least 4 days/wk for improved carryover between sessions    Baseline  HEP compliant     Time  2    Period  Weeks    Status  Achieved      PT SHORT TERM GOAL #2   Title  Patient will report a worst pain of 6/10 on VAS in cervical spine to improve tolerance with ADLs and reduced symptoms with activities.     Baseline  4/30: 9/10 5/21: 0/10 pain    Time  2    Period  Weeks    Status  Achieved      PT SHORT TERM GOAL #3   Title  Patient will perform cervical rotation at least 50 degrees, flexion and extension to 70 degrees, and sidebending to 40 degrees without pain    Baseline  4/30: R 50 L 34 Rotation, R 26 L 35 sb, Extension 35 flex 35 ; 5/21: extension 46, SB R 41 L 40, rotation R 62 L 61,     Time  2    Period  Weeks    Status  Achieved        PT Long Term Goals - 02/18/18 1129      PT LONG TERM GOAL #1   Title  Pt's 5xSTS will impove to equal to or below 2 minutes to demonstrate improved balance and BLE strength    Baseline  3.02 minutes; 5/15: 1 min 30 seconds 9/26:unable to complete fully secondary to patient not feeling well    Time  6    Period  Weeks    Status  Achieved      PT LONG TERM GOAL #2   Title  Pt's 3mT will improve to at least  0.5 m/s for improved safety ambulating in home    Baseline  0.14 m/s; 5/15: .254m  7/10: .36 m/s  8/20: .42 m/s  9/26: 0.2688m10/14: .26 m/s improved hip and knee clearance     Time  8    Period  Weeks    Status  Partially Met    Target Date  04/15/18      PT LONG TERM GOAL #3   Title  Pt's Bil hip F strength will improve to at least 2+/5 BLEs for improved gait mechanics and safety    Baseline  L: 2/5, R: 1/5 7/10: 2/5 bilaterally  8/20: 2/5 bilat 9/26: 2/5 bilat 10/14: 2/5 bilaterally ,improved hip flexor muscle activation    Time  8    Period  Weeks    Status  Partially Met    Target Date  04/15/18      PT LONG TERM GOAL #4   Title  Pt's ABC Scale will improve to at least 40% to demonstrate improved balance    Baseline  28.13% 5/15; 24.4% 7/10: 23.4% 8/20: 22. 2% 9/26: 25.3% 10/14: 26.5%    Time  6    Period  Weeks    Status  Partially Met    Target Date  04/15/18  PT LONG TERM GOAL #5   Title  Pt will be able to perform car transfer without assist to work towards pt's goal of returning to driving    Baseline  only needs help with one leg to put onto running board to get into sisters car; 8/20: in and out of own car independently.     Time  6    Period  Weeks    Status  Achieved      Additional Long Term Goals   Additional Long Term Goals  Yes      PT LONG TERM GOAL #6   Title  Pt will be able to perform tub bench into/out of shower transfer without assist for improved independence with this task    Baseline  challenged with steps at this time, getting better, stands and gives sponge bath independently now  9/26: has not tried transfer yet    Time  6    Period  Weeks    Status  Partially Met      PT LONG TERM GOAL #7   Title  Patient will perform cervical rotation at least 60 degrees, flexion and extension to 70 degrees, and sidebending to 40 degrees without pain    Baseline  4/30: R 50 L 34 Rotation, R 26 L 35 sb, Extension 35 flex 35; 5/21: extension 46, SB R 41 L  40, rotation R 62 L 61,     Time  4    Period  Weeks    Status  Achieved      PT LONG TERM GOAL #8   Title  Patient will improve NDI to <28% to reduce disability to mild  percieved disability for improved quality of life.     Baseline  4/30: 56% severe disability 5/21: 10%    Time  4    Period  Weeks    Status  Achieved      PT LONG TERM GOAL  #9   TITLE  Patient will report a worst pain of 3/10 on VAS in  cervical spine  to improve tolerance with ADLs and reduced symptoms with activities.     Baseline  4/30: 9/10 5/21: 0/10 in neck     Time  4    Period  Weeks    Status  Achieved      PT LONG TERM GOAL  #10   TITLE  Pt's 5xSTS will impove to equal to or below 50 seconds to demonstrate improved balance and BLE strength    Baseline  5/15: 1 min 30.9 seconds 7/10: 64 seconds  8/20: 1 min 29 seconds with L foot frequently slipping out requiring patient to perform multiple attempts per one stand 10/10: 36 seconds    Time  8    Period  Weeks    Status  Achieved      PT LONG TERM GOAL  #11   TITLE  Patient will descend/ascend down a handicap ramp  with RW and with supervision to increase independence in home environment.     Baseline  7/10 can ascend, unable to descend ; 8/20: achieved on 8/31    Time  6    Period  Weeks    Status  Achieved      PT LONG TERM GOAL  #12   TITLE  Patient will negotiate one step ~4 inches to enter house independently to increase functional independence in natural environment.     Baseline  8/20: unable to do 9/26: patient too fatigued/not  feeling well to perform 10/14: unable to perform at this time    Time  8    Period  Weeks    Status  On-going    Target Date  04/15/18      PT LONG TERM GOAL  #13   TITLE  Patient will perform STS from standard height surface to walker to allow patient to participate and transition in public areas when not in wheelchair.     Baseline  10/14: able to perform in W/C which is raised slightly     Time  8    Period   Weeks    Status  New    Target Date  04/15/18            Plan - 03/13/18 1207    Clinical Impression Statement  Patient ambulated in parallel bars with improved speed and smoother turns and transitions. Patient required cues for adequate foot clearance with occasional drag of LLE due to LE weakness and strength. Patient had improved adductor muscle recruitment and contraction during seated squeezes demonstrating improved strength and activation. Patient will continue to benefit from skilled physical therapy to improve mobility and independence.     Rehab Potential  Good    PT Frequency  2x / week    PT Duration  8 weeks    PT Treatment/Interventions  ADLs/Self Care Home Management;Aquatic Therapy;Biofeedback;Cryotherapy;Electrical Stimulation;Iontophoresis 79m/ml Dexamethasone;Traction;Moist Heat;Ultrasound;DME Instruction;Gait training;Stair training;Functional mobility training;Therapeutic activities;Therapeutic exercise;Balance training;Neuromuscular re-education;Patient/family education;Orthotic Fit/Training;Wheelchair mobility training;Manual techniques;Compression bandaging;Passive range of motion;Dry needling;Energy conservation;Taping;Splinting    PT Next Visit Plan  standing exercises in new AFO    PT Home Exercise Plan  to initiate next session (please incorporate hip flexor and adductor stretches in HEP if possible -KG)    Consulted and Agree with Plan of Care  Patient       Patient will benefit from skilled therapeutic intervention in order to improve the following deficits and impairments:  Abnormal gait, Decreased activity tolerance, Decreased balance, Decreased coordination, Decreased endurance, Decreased knowledge of use of DME, Decreased mobility, Decreased range of motion, Decreased safety awareness, Decreased strength, Difficulty walking, Impaired perceived functional ability, Impaired sensation, Impaired tone, Impaired UE functional use, Improper body mechanics, Postural  dysfunction  Visit Diagnosis: Muscle weakness (generalized)  Unsteadiness on feet  Other abnormalities of gait and mobility     Problem List Patient Active Problem List   Diagnosis Date Noted  . Radial styloid tenosynovitis 03/12/2018  . Wheelchair confinement 02/27/2018  . Localized osteoporosis with current pathological fracture with routine healing 01/19/2017  . Wrist fracture 01/16/2017  . Sprain of ankle 03/23/2016  . Closed fracture of lateral malleolus 03/16/2016  . Health care maintenance 01/24/2016  . Blood pressure elevated without history of HTN 10/25/2015  . Essential hypertension 10/25/2015  . Multiple sclerosis ([facility] 10/02/2015  . Chronic left shoulder pain 07/19/2015  . [place] 07/14/2015  . MS (multiple sclerosis) ([facility] 11/26/2014  . Increased body mass index 11/26/2014  . HPV test positive 11/26/2014  . Status post laparoscopic supracervical hysterectomy 11/26/2014  . Galactorrhea 11/26/2014  . Back ache 05/21/2014  . Adiposity 05/21/2014  . Disordered sleep 05/21/2014  . Muscle spasticity 05/21/2014  . Spasticity 05/21/2014  . Calculus of kidney 12/09/2013  . Renal colic 009/81/1914 . Hypercholesteremia 08/19/2013  . Hereditary and idiopathic neuropathy 08/19/2013  . Hypercholesterolemia without hypertriglyceridemia 08/19/2013  . Bladder infection, chronic 07/25/2012  . Disorder of bladder function 07/25/2012  . Incomplete bladder emptying 07/25/2012  .  Microscopic hematuria 07/25/2012  . Right upper quadrant pain 07/25/2012   Erick Blinks, SPT  This entire session was performed under direct supervision and direction of a licensed therapist/therapist assistant . I have personally read, edited and approve of the note as written.  Janna Arch, PT, DPT   03/13/2018, 1:10 PM  [place] MAIN [facility] SERVICES [address] [place], [place], [address] Phone: [phone]   Fax:   [phone]  Name: Lisa Williams MRN: [id number] Date of Birth: 04/25/1961

## 2018-03-14 DIAGNOSIS — G35 Multiple sclerosis: Secondary | ICD-10-CM | POA: Diagnosis not present

## 2018-03-18 ENCOUNTER — Ambulatory Visit: Payer: PPO | Admitting: Physical Therapy

## 2018-03-20 ENCOUNTER — Ambulatory Visit: Payer: PPO

## 2018-03-20 DIAGNOSIS — R2681 Unsteadiness on feet: Secondary | ICD-10-CM

## 2018-03-20 DIAGNOSIS — M6281 Muscle weakness (generalized): Secondary | ICD-10-CM

## 2018-03-20 DIAGNOSIS — R2689 Other abnormalities of gait and mobility: Secondary | ICD-10-CM

## 2018-03-20 NOTE — Therapy (Signed)
[place] [facility] [address] [place], [place], [address] Phone: [phone]   Fax:  [phone]  Physical Therapy Treatment  Patient Details  Name: Lisa [facility] MRN: [id number] Date of Birth: [birth year] Referring Provider (PT): Gurney Maxin, MD   Encounter Date: 03/20/2018  PT End of Session - 03/20/18 1014    Visit Number  56    Number of Visits  65    Date for PT Re-Evaluation  04/15/18    Authorization Type  8/10 PN start 10/14    PT Start Time  1015    PT Stop Time  1100    PT Time Calculation (min)  45 min    Equipment Utilized During Treatment  Gait belt    Activity Tolerance  Patient limited by fatigue;Patient tolerated treatment well    Behavior During Therapy  Lisa [facility] for tasks assessed/performed       Past Medical History:  Diagnosis Date  . Abdominal pain, right upper quadrant   . Back pain   . Calculus of kidney 12/09/2013  . Chronic back pain    unspecified  . Chronic left shoulder pain 07/19/2015  . Functional disorder of bladder    other  . Galactorrhea 11/26/2014   Chronic   . Hereditary and idiopathic neuropathy 08/19/2013  . HPV test positive   . Hypercholesteremia 08/19/2013  . Incomplete bladder emptying   . Microscopic hematuria   . MS (multiple sclerosis) ([place])   . Muscle spasticity 05/21/2014  . Nonspecific findings on examination of urine    other  . Osteopenia   . Status post laparoscopic supracervical hysterectomy 11/26/2014  . Tobacco user 11/26/2014  . Wrist fracture     Past Surgical History:  Procedure Laterality Date  . bilateral tubal ligation  1996  . BREAST CYST EXCISION Left 2002  . KNEE SURGERY     right  . LAPAROSCOPIC SUPRACERVICAL HYSTERECTOMY  08/05/2013  . ORIF WRIST FRACTURE Left 01/17/2017   Procedure: OPEN REDUCTION INTERNAL FIXATION (ORIF) WRIST FRACTURE;  Surgeon: Lovell Sheehan, MD;  Location: [facility] ORS;  Service: Orthopedics;  Laterality: Left;  . TUBAL  LIGATION Bilateral   . VAGINAL HYSTERECTOMY  03/2006    There were no vitals filed for this visit.  Subjective Assessment - 03/20/18 1014    Subjective  Pt reports that she had a controlled lowering yesterday when she was going down the threshhold on the way out to the car. She went down to sitting and had to have EMS come to help her up off the ground. She reports only mild soreness in her R wrist upon arrival today but otherwise no pain. No specific questions or concerns.    Pertinent History  Pt is a 57 y/o F who presents with BLE weakness and difficulty ambulating due to h/o MS that was diagnosed in 1995. Pt denies any pain associated. Pt was admitted to the [facility] in November 2018 due to MS exacerbation. Nov-Dec to HHPT-->[facility] until end of Jan. After rehab the pt was able to ambulate up to 100 ft with the RW. Pt has Bil AFO, her therapist at [facility] thinks she needs a new AFO as her feet still drag when she ambulates, pt reports HHPT mentioned a spring AFO. Pt has custom AFOs that were originally made 4 years ago. Goals: improve balance, to be able to get into the shower with her tub bench without assist, to be able to get up 2 steps at sister's  home with railings on both sides but too far apart to reach both sides, to be able to drive adapted car. Pt is staying at other sister's house with one step to get inside and currently picking up L leg with UEs and then stepping in. Pt steps down leading with LLE. Pt wants to work on walking side to side as this is challenging and she had not yet progressed to this at rehab. Pt able to get in/out the car with assist only to bring BLEs into the car. Hoping to drive by June or July with adapted car (already has one). Pt needs assist bringing LEs into and out of [facility]. Pt able to perform sit>stand without assist from [facility] with a pillow on the seat for a boost. Pt denies any falls in the past 6 months. Pt has been increasing her time alone at home  up to a few hours at a time to become more independent. Pt has both manual and power WC. Uses power WC at home, uses manual WC out in community. Pt able to self propel manual WC. Pt needs assist putting pants around ankles but she can pull them up. Pt can put on her shoes but needs help getting her leg up to do so. Pt able to tie her shoelaces on her own. Pt has a tub/shower unit at home with a tub bench. No grab bars in the shower. Has hand held shower head. Pt currently taking a sponge bath. Pt has a regular height toilet with a BSC over top. Pt with h/o L wrist fx with no precautions, pt wore her soft brace to evaluation for comfort but reports her wrist has healed and is doing well following OT.     Limitations  Lifting;Standing;Walking;House hold activities    How long can you sit comfortably?  n/a    How long can you stand comfortably?  3 minutes without UE assist, limited by fatigue    How long can you walk comfortably?  100 ft, limited by fatigue    Patient Stated Goals  see above    Currently in Pain?  No/denies          TREATMENT  Ther-ex  Seated: Weighted ball (3000Gr) straight arm raises with upright posture seated on mat table without back support x 15, horizontal rotation x 15 each direction, tosses with therapist x 15 Balloon pass with pt sitting on Airex pad on mat table with feet/knees together, PT challenging patient to reach within and outside of BOS for core activation.Upright posture with no back support x 3 minutes; Attempted sit to stand from elevated mat table sitting on Airex pad with min/modA+1 however pt unable to perform; Seated marches x 10 each LE with therapist assist. Verbal cues to lift leg; Seated adduction squeezes against gentle manual resistance x 10; Seated clams with gentle manual resistance x 10;   Standing: March 10x each leg BUE support;Improved clearance of R foot with cues for knee to ceiling 2"Airextoe tapsto padx5each leg BUE support,  cues for bringing knees to ceiling (hip flexion), unable to bring foot on top ofpad but able to kick pad with forward motion Ambulate in // bars with BUE support with increased step length and clearance of bilateral legs. 4x length of bars. Verbal cues to lift knees towards the ceiling for foot clearance.    Pt educated throughout session about proper posture and technique with exercises. Improved exercise technique, movement at target joints, use of target muscles after  min to mod verbal, visual, tactile cues.    Patient demonstrating improved strength with transfers today. Continued to work on LE strengthening and balance. Pt is able to ambulate 4 lengths in parallel bars without seated rest break. Attempted sit to stand from elevated mat table sitting on Airex pad with min/modA+1 however pt unable to perform today and is also very fearful of falling. Pt encouraged to continue HEP and follow-up as scheduled. Patient will continue to benefit from skilled physical therapy to improve mobility and independence.                   PT Short Term Goals - 02/18/18 1301      PT SHORT TERM GOAL #1   Title  Pt will be completed HEP with min assist from caregiver at least 4 days/wk for improved carryover between sessions    Baseline  HEP compliant     Time  2    Period  Weeks    Status  Achieved      PT SHORT TERM GOAL #2   Title  Patient will report a worst pain of 6/10 on VAS in cervical spine to improve tolerance with ADLs and reduced symptoms with activities.     Baseline  4/30: 9/10 5/21: 0/10 pain    Time  2    Period  Weeks    Status  Achieved      PT SHORT TERM GOAL #3   Title  Patient will perform cervical rotation at least 50 degrees, flexion and extension to 70 degrees, and sidebending to 40 degrees without pain    Baseline  4/30: R 50 L 34 Rotation, R 26 L 35 sb, Extension 35 flex 35 ; 5/21: extension 46, SB R 41 L 40, rotation R 62 L 61,     Time  2    Period  Weeks     Status  Achieved        PT Long Term Goals - 02/18/18 1129      PT LONG TERM GOAL #1   Title  Pt's 5xSTS will impove to equal to or below 2 minutes to demonstrate improved balance and BLE strength    Baseline  3.02 minutes; 5/15: 1 min 30 seconds 9/26:unable to complete fully secondary to patient not feeling well    Time  6    Period  Weeks    Status  Achieved      PT LONG TERM GOAL #2   Title  Pt's 8mT will improve to at least 0.5 m/s for improved safety ambulating in home    Baseline  0.14 m/s; 5/15: .217m  7/10: .36 m/s  8/20: .42 m/s  9/26: 0.2665m10/14: .26 m/s improved hip and knee clearance     Time  8    Period  Weeks    Status  Partially Met    Target Date  04/15/18      PT LONG TERM GOAL #3   Title  Pt's Bil hip F strength will improve to at least 2+/5 BLEs for improved gait mechanics and safety    Baseline  L: 2/5, R: 1/5 7/10: 2/5 bilaterally  8/20: 2/5 bilat 9/26: 2/5 bilat 10/14: 2/5 bilaterally ,improved hip flexor muscle activation    Time  8    Period  Weeks    Status  Partially Met    Target Date  04/15/18      PT LONG TERM GOAL #4   Title  Pt's ABC  Scale will improve to at least 40% to demonstrate improved balance    Baseline  28.13% 5/15; 24.4% 7/10: 23.4% 8/20: 22. 2% 9/26: 25.3% 10/14: 26.5%    Time  6    Period  Weeks    Status  Partially Met    Target Date  04/15/18      PT LONG TERM GOAL #5   Title  Pt will be able to perform car transfer without assist to work towards pt's goal of returning to driving    Baseline  only needs help with one leg to put onto running board to get into sisters car; 8/20: in and out of own car independently.     Time  6    Period  Weeks    Status  Achieved      Additional Long Term Goals   Additional Long Term Goals  Yes      PT LONG TERM GOAL #6   Title  Pt will be able to perform tub bench into/out of shower transfer without assist for improved independence with this task    Baseline  challenged with steps  at this time, getting better, stands and gives sponge bath independently now  9/26: has not tried transfer yet    Time  6    Period  Weeks    Status  Partially Met      PT LONG TERM GOAL #7   Title  Patient will perform cervical rotation at least 60 degrees, flexion and extension to 70 degrees, and sidebending to 40 degrees without pain    Baseline  4/30: R 50 L 34 Rotation, R 26 L 35 sb, Extension 35 flex 35; 5/21: extension 46, SB R 41 L 40, rotation R 62 L 61,     Time  4    Period  Weeks    Status  Achieved      PT LONG TERM GOAL #8   Title  Patient will improve NDI to <28% to reduce disability to mild  percieved disability for improved quality of life.     Baseline  4/30: 56% severe disability 5/21: 10%    Time  4    Period  Weeks    Status  Achieved      PT LONG TERM GOAL  #9   TITLE  Patient will report a worst pain of 3/10 on VAS in  cervical spine  to improve tolerance with ADLs and reduced symptoms with activities.     Baseline  4/30: 9/10 5/21: 0/10 in neck     Time  4    Period  Weeks    Status  Achieved      PT LONG TERM GOAL  #10   TITLE  Pt's 5xSTS will impove to equal to or below 50 seconds to demonstrate improved balance and BLE strength    Baseline  5/15: 1 min 30.9 seconds 7/10: 64 seconds  8/20: 1 min 29 seconds with L foot frequently slipping out requiring patient to perform multiple attempts per one stand 10/10: 36 seconds    Time  8    Period  Weeks    Status  Achieved      PT LONG TERM GOAL  #11   TITLE  Patient will descend/ascend down a handicap ramp  with RW and with supervision to increase independence in home environment.     Baseline  7/10 can ascend, unable to descend ; 8/20: achieved on 8/31    Time  6  Period  Weeks    Status  Achieved      PT LONG TERM GOAL  #12   TITLE  Patient will negotiate one step ~4 inches to enter house independently to increase functional independence in natural environment.     Baseline  8/20: unable to do 9/26:  patient too fatigued/not feeling well to perform 10/14: unable to perform at this time    Time  8    Period  Weeks    Status  On-going    Target Date  04/15/18      PT LONG TERM GOAL  #13   TITLE  Patient will perform STS from standard height surface to walker to allow patient to participate and transition in public areas when not in wheelchair.     Baseline  10/14: able to perform in W/C which is raised slightly     Time  8    Period  Weeks    Status  New    Target Date  04/15/18            Plan - 03/20/18 1014    Clinical Impression Statement  Patient demonstrating improved strength with transfers today. Continued to work on LE strengthening and balance. Pt is able to ambulate 4 lengths in parallel bars without seated rest break. Attempted sit to stand from elevated mat table sitting on Airex pad with min/modA+1 however pt unable to perform today and is also very fearful of falling. Pt encouraged to continue HEP and follow-up as scheduled. Patient will continue to benefit from skilled physical therapy to improve mobility and independence.     Rehab Potential  Good    PT Frequency  2x / week    PT Duration  8 weeks    PT Treatment/Interventions  ADLs/Self Care Home Management;Aquatic Therapy;Biofeedback;Cryotherapy;Electrical Stimulation;Iontophoresis 25m/ml Dexamethasone;Traction;Moist Heat;Ultrasound;DME Instruction;Gait training;Stair training;Functional mobility training;Therapeutic activities;Therapeutic exercise;Balance training;Neuromuscular re-education;Patient/family education;Orthotic Fit/Training;Wheelchair mobility training;Manual techniques;Compression bandaging;Passive range of motion;Dry needling;Energy conservation;Taping;Splinting    PT Next Visit Plan  Outcome measures, goals, and progress note, continue strengthening/balance    PT Home Exercise Plan  to initiate next session (please incorporate hip flexor and adductor stretches in HEP if possible -KG)    Consulted and  Agree with Plan of Care  Patient       Patient will benefit from skilled therapeutic intervention in order to improve the following deficits and impairments:  Abnormal gait, Decreased activity tolerance, Decreased balance, Decreased coordination, Decreased endurance, Decreased knowledge of use of DME, Decreased mobility, Decreased range of motion, Decreased safety awareness, Decreased strength, Difficulty walking, Impaired perceived functional ability, Impaired sensation, Impaired tone, Impaired UE functional use, Improper body mechanics, Postural dysfunction  Visit Diagnosis: Muscle weakness (generalized)  Unsteadiness on feet  Other abnormalities of gait and mobility     Problem List Patient Active Problem List   Diagnosis Date Noted  . Radial styloid tenosynovitis 03/12/2018  . Wheelchair confinement 02/27/2018  . Localized osteoporosis with current pathological fracture with routine healing 01/19/2017  . Wrist fracture 01/16/2017  . Sprain of ankle 03/23/2016  . Closed fracture of lateral malleolus 03/16/2016  . Health care maintenance 01/24/2016  . Blood pressure elevated without history of HTN 10/25/2015  . Essential hypertension 10/25/2015  . Multiple sclerosis ([place] 10/02/2015  . Chronic left shoulder pain 07/19/2015  . Multiple sclerosis exacerbation ([place] 07/14/2015  . MS ([place] sclerosis) ([place] 11/26/2014  . Increased body mass index 11/26/2014  . HPV test positive 11/26/2014  . Status post laparoscopic supracervical hysterectomy  11/26/2014  . Galactorrhea 11/26/2014  . Back ache 05/21/2014  . Adiposity 05/21/2014  . Disordered sleep 05/21/2014  . Muscle spasticity 05/21/2014  . Spasticity 05/21/2014  . Calculus of kidney 12/09/2013  . Renal colic 40/99/2780  . Hypercholesteremia 08/19/2013  . Hereditary and idiopathic neuropathy 08/19/2013  . Hypercholesterolemia without hypertriglyceridemia 08/19/2013  . Bladder infection, chronic 07/25/2012  . Disorder  of bladder function 07/25/2012  . Incomplete bladder emptying 07/25/2012  . Microscopic hematuria 07/25/2012  . Right upper quadrant pain 07/25/2012    Phillips Grout PT, DPT, GCS  Georgie Eduardo 03/20/2018, 12:02 PM  [place] [facility] [address] [place], [place], [address] Phone: [phone]   Fax:  [phone]  Name: Ambert Virrueta Fried[facility] MRN: [id number] Date of Birth: 1960/06/21

## 2018-03-22 ENCOUNTER — Ambulatory Visit: Payer: PPO | Admitting: Physical Therapy

## 2018-03-22 DIAGNOSIS — R2681 Unsteadiness on feet: Secondary | ICD-10-CM

## 2018-03-22 DIAGNOSIS — R2689 Other abnormalities of gait and mobility: Secondary | ICD-10-CM

## 2018-03-22 DIAGNOSIS — M6281 Muscle weakness (generalized): Secondary | ICD-10-CM | POA: Diagnosis not present

## 2018-03-22 NOTE — Therapy (Addendum)
[place] MAIN [facility] SERVICES [address] [place], [place], [address] Phone: [phone]   Fax:  [phone]  Physical Therapy Treatment Physical Therapy Progress Note   Dates of reporting period  02/18/18  to   03/22/18   Patient Details  Name: Lisa Williams MRN: [id number] Date of Birth: 06-06-1960 Referring Provider (PT): Gurney Maxin, MD   Encounter Date: 03/22/2018  PT End of Session - 03/22/18 1151    Visit Number  35    Number of Visits  53    Date for PT Re-Evaluation  04/15/18    Authorization Type  9/10 PN start 10/14 (next1/10 PN starts 11/15)    PT Start Time  1046    PT Stop Time  1131    PT Time Calculation (min)  45 min    Equipment Utilized During Treatment  Gait belt    Activity Tolerance  Patient limited by fatigue;Patient tolerated treatment well    Behavior During Therapy  [facility] for tasks assessed/performed       Past Medical History:  Diagnosis Date  . Abdominal pain, right upper quadrant   . Back pain   . Calculus of kidney 12/09/2013  . Chronic back pain    unspecified  . Chronic left shoulder pain 07/19/2015  . Functional disorder of bladder    other  . Galactorrhea 11/26/2014   Chronic   . Hereditary and idiopathic neuropathy 08/19/2013  . HPV test positive   . Hypercholesteremia 08/19/2013  . Incomplete bladder emptying   . Microscopic hematuria   . MS (multiple sclerosis) ([place])   . Muscle spasticity 05/21/2014  . Nonspecific findings on examination of urine    other  . Osteopenia   . Status post laparoscopic supracervical hysterectomy 11/26/2014  . Tobacco user 11/26/2014  . Wrist fracture     Past Surgical History:  Procedure Laterality Date  . bilateral tubal ligation  1996  . BREAST CYST EXCISION Left 2002  . KNEE SURGERY     right  . LAPAROSCOPIC SUPRACERVICAL HYSTERECTOMY  08/05/2013  . ORIF WRIST FRACTURE Left 01/17/2017   Procedure: OPEN REDUCTION INTERNAL FIXATION (ORIF)  WRIST FRACTURE;  Surgeon: Lovell Sheehan, MD;  Location: [facility] ORS;  Service: Orthopedics;  Laterality: Left;  . TUBAL LIGATION Bilateral   . VAGINAL HYSTERECTOMY  03/2006    There were no vitals filed for this visit.  Subjective Assessment - 03/22/18 1149    Subjective  Patient states she is doing well today. Denies any falls or LOB since last session. Reports she has still not tried the tub bench transfer.    Pertinent History  Pt is a 57 y/o F who presents with BLE weakness and difficulty ambulating due to h/o MS that was diagnosed in 1995. Pt denies any pain associated. Pt was admitted to the hospital in November 2018 due to MS exacerbation. Nov-Dec to HHPT-->[facility] until end of Jan. After rehab the pt was able to ambulate up to 100 ft with the RW. Pt has Bil AFO, her therapist at [place] thinks she needs a new AFO as her feet still drag when she ambulates, pt reports HHPT mentioned a spring AFO. Pt has custom AFOs that were originally made 4 years ago. Goals: improve balance, to be able to get into the shower with her tub bench without assist, to be able to get up 2 steps at sister's home with railings on both sides but too far apart to reach both sides, to  be able to drive adapted car. Pt is staying at other sister's house with one step to get inside and currently picking up L leg with UEs and then stepping in. Pt steps down leading with LLE. Pt wants to work on walking side to side as this is challenging and she had not yet progressed to this at rehab. Pt able to get in/out the car with assist only to bring BLEs into the car. Hoping to drive by June or July with adapted car (already has one). Pt needs assist bringing LEs into and out of hospital bed. Pt able to perform sit>stand without assist from [facility] with a pillow on the seat for a boost. Pt denies any falls in the past 6 months. Pt has been increasing her time alone at home up to a few hours at a time to become more independent. Pt has  both manual and power WC. Uses power WC at home, uses manual WC out in community. Pt able to self propel manual WC. Pt needs assist putting pants around ankles but she can pull them up. Pt can put on her shoes but needs help getting her leg up to do so. Pt able to tie her shoelaces on her own. Pt has a tub/shower unit at home with a tub bench. No grab bars in the shower. Has hand held shower head. Pt currently taking a sponge bath. Pt has a regular height toilet with a BSC over top. Pt with h/o L wrist fx with no precautions, pt wore her soft brace to evaluation for comfort but reports her wrist has healed and is doing well following OT.     Limitations  Lifting;Standing;Walking;House hold activities    How long can you sit comfortably?  n/a    How long can you stand comfortably?  3 minutes without UE assist, limited by fatigue    How long can you walk comfortably?  100 ft, limited by fatigue    Patient Stated Goals  see above    Currently in Pain?  No/denies        Progress note 21mt- 35 seconds; 0.219m; improved bilateral foot clearance  Strength 2/5 with improved hip flexion and quadriceps activity  5xsts- 42 seconds with W/C pushing against plinth. 1 hand on RW 1 hand pushing from W/C Tub bench transfer- has not attempted per patient report ABC- 24.38% Sit to stand-20inches (x3 times verbal cues to not flop)     Seated balloon taps without back support reaching in/out of BOS 2x40taps  Seated marches x15 each LE without back support.   Seated LAQ x10 3 second holds each LE     Patient's condition has the potential to improve in response to therapy. Maximum improvement is yet to be obtained. The anticipated improvement is attainable and reasonable in a generally predictable time.  Patient reports she is walking better.       PT Education - 03/22/18 1150    Education provided  Yes    Education Details  exercise technique, stability, goals    Person(s) Educated  Patient     Methods  Explanation;Demonstration;Verbal cues    Comprehension  Verbalized understanding;Returned demonstration       PT Short Term Goals - 03/22/18 1154      PT SHORT TERM GOAL #1   Title  Pt will be completed HEP with min assist from caregiver at least 4 days/wk for improved carryover between sessions    Baseline  HEP compliant  Time  2    Period  Weeks    Status  Achieved      PT SHORT TERM GOAL #2   Title  Patient will report a worst pain of 6/10 on VAS in cervical spine to improve tolerance with ADLs and reduced symptoms with activities.     Baseline  4/30: 9/10 5/21: 0/10 pain    Time  2    Period  Weeks    Status  Achieved      PT SHORT TERM GOAL #3   Title  Patient will perform cervical rotation at least 50 degrees, flexion and extension to 70 degrees, and sidebending to 40 degrees without pain    Baseline  4/30: R 50 L 34 Rotation, R 26 L 35 sb, Extension 35 flex 35 ; 5/21: extension 46, SB R 41 L 40, rotation R 62 L 61,     Time  2    Period  Weeks    Status  Achieved        PT Long Term Goals - 03/22/18 1050      PT LONG TERM GOAL #1   Title  Pt's 5xSTS will impove to equal to or below 2 minutes to demonstrate improved balance and BLE strength    Baseline  3.02 minutes; 5/15: 1 min 30 seconds 9/26:unable to complete fully secondary to patient not feeling well    Time  6    Period  Weeks    Status  Achieved      PT LONG TERM GOAL #2   Title  Pt's 77mT will improve to at least 0.5 m/s for improved safety ambulating in home    Baseline  0.14 m/s; 5/15: .256m  7/10: .36 m/s  8/20: .42 m/s  9/26: 0.2658m10/14: .26 m/s improved hip and knee clearance 11/15: 0.72m69mmproved hip and knee clearance    Time  8    Period  Weeks    Status  Partially Met      PT LONG TERM GOAL #3   Title  Pt's Bil hip F strength will improve to at least 2+/5 BLEs for improved gait mechanics and safety    Baseline  L: 2/5, R: 1/5 7/10: 2/5 bilaterally  8/20: 2/5 bilat 9/26: 2/5  bilat 10/14: 2/5 bilaterally ,improved hip flexor muscle activation 2/5: improved hip flexor and quad activation    Time  8    Period  Weeks    Status  Partially Met      PT LONG TERM GOAL #4   Title  Pt's ABC Scale will improve to at least 40% to demonstrate improved balance    Baseline  28.13% 5/15; 24.4% 7/10: 23.4% 8/20: 22. 2% 9/26: 25.3% 10/14: 26.5% 11/15: 24.38%    Time  6    Period  Weeks    Status  Partially Met      PT LONG TERM GOAL #5   Title  Pt will be able to perform car transfer without assist to work towards pt's goal of returning to driving    Baseline  only needs help with one leg to put onto running board to get into sisters car; 8/20: in and out of own car independently.     Time  6    Period  Weeks    Status  Achieved      PT LONG TERM GOAL #6   Title  Pt will be able to perform tub bench into/out of shower transfer without assist for  improved independence with this task    Baseline  challenged with steps at this time, getting better, stands and gives sponge bath independently now  9/26: has not tried transfer yet 11/15: has not attempted yet    Time  6    Period  Weeks    Status  Partially Met      PT LONG TERM GOAL #7   Title  Patient will perform cervical rotation at least 60 degrees, flexion and extension to 70 degrees, and sidebending to 40 degrees without pain    Baseline  4/30: R 50 L 34 Rotation, R 26 L 35 sb, Extension 35 flex 35; 5/21: extension 46, SB R 41 L 40, rotation R 62 L 61,     Time  4    Period  Weeks    Status  Achieved      PT LONG TERM GOAL #8   Title  Patient will improve NDI to <28% to reduce disability to mild  percieved disability for improved quality of life.     Baseline  4/30: 56% severe disability 5/21: 10%    Time  4    Period  Weeks    Status  Achieved      PT LONG TERM GOAL  #9   TITLE  Patient will report a worst pain of 3/10 on VAS in  cervical spine  to improve tolerance with ADLs and reduced symptoms with  activities.     Baseline  4/30: 9/10 5/21: 0/10 in neck     Time  4    Period  Weeks    Status  Achieved      PT LONG TERM GOAL  #10   TITLE  Pt's 5xSTS will impove to equal to or below 50 seconds to demonstrate improved balance and BLE strength    Baseline  5/15: 1 min 30.9 seconds 7/10: 64 seconds  8/20: 1 min 29 seconds with L foot frequently slipping out requiring patient to perform multiple attempts per one stand 10/10: 36 seconds     Time  8    Period  Weeks    Status  Achieved      PT LONG TERM GOAL  #11   TITLE  Patient will descend/ascend down a handicap ramp  with RW and with supervision to increase independence in home environment.     Baseline  7/10 can ascend, unable to descend ; 8/20: achieved on 8/31    Time  6    Period  Weeks    Status  Achieved      PT LONG TERM GOAL  #12   TITLE  Patient will negotiate one step ~4 inches to enter house independently to increase functional independence in natural environment.     Baseline  8/20: unable to do 9/26: patient too fatigued/not feeling well to perform 10/14: unable to perform at this time 11/15: unable to perform at this time    Time  8    Period  Weeks    Status  On-going      PT LONG TERM GOAL  #13   TITLE  Patient will perform STS from standard height surface to walker to allow patient to participate and transition in public areas when not in wheelchair.     Baseline  10/14: able to perform in W/C which is raised slightly 11/15: 20inch height    Time  8    Period  Weeks    Status  Partially Met  Target Date  04/15/18            Plan - 03/22/18 1200    Clinical Impression Statement  Patient continues to make progress towards goals. Patient improved 19mt time from 0.233m to 0.2862mdemonstrating improved gait speed with increased foot clearance and gait mechanics. Patient also able to complete sit to stand from 20in height demonstrating improved functional strength however is still unable to perform from a  regular chair height at this time due to LE weakness. Patient remains highly motivated to continue to achieve all goals and increase independence at home and in community. Patient's condition has the potential to improve in response to therapy. Maximum improvement is yet to be obtained. The anticipated improvement is attainable and reasonable in a generally predictable time.  Patient will continue to benefit from skilled physical therapy to improve mobility and independence.     Rehab Potential  Good    PT Frequency  2x / week    PT Duration  8 weeks    PT Treatment/Interventions  ADLs/Self Care Home Management;Aquatic Therapy;Biofeedback;Cryotherapy;Electrical Stimulation;Iontophoresis 4mg40m Dexamethasone;Traction;Moist Heat;Ultrasound;DME Instruction;Gait training;Stair training;Functional mobility training;Therapeutic activities;Therapeutic exercise;Balance training;Neuromuscular re-education;Patient/family education;Orthotic Fit/Training;Wheelchair mobility training;Manual techniques;Compression bandaging;Passive range of motion;Dry needling;Energy conservation;Taping;Splinting    PT Next Visit Plan  Outcome measures, goals, and progress note, continue strengthening/balance    PT Home Exercise Plan  to initiate next session (please incorporate hip flexor and adductor stretches in HEP if possible -KG)    Consulted and Agree with Plan of Care  Patient       Patient will benefit from skilled therapeutic intervention in order to improve the following deficits and impairments:  Abnormal gait, Decreased activity tolerance, Decreased balance, Decreased coordination, Decreased endurance, Decreased knowledge of use of DME, Decreased mobility, Decreased range of motion, Decreased safety awareness, Decreased strength, Difficulty walking, Impaired perceived functional ability, Impaired sensation, Impaired tone, Impaired UE functional use, Improper body mechanics, Postural dysfunction  Visit Diagnosis: Muscle  weakness (generalized)  Unsteadiness on feet  Other abnormalities of gait and mobility     Problem List Patient Active Problem List   Diagnosis Date Noted  . Radial styloid tenosynovitis 03/12/2018  . Wheelchair confinement 02/27/2018  . Localized osteoporosis with current pathological fracture with routine healing 01/19/2017  . Wrist fracture 01/16/2017  . Sprain of ankle 03/23/2016  . Closed fracture of lateral malleolus 03/16/2016  . Health care maintenance 01/24/2016  . Blood pressure elevated without history of HTN 10/25/2015  . Essential hypertension 10/25/2015  . Multiple sclerosis (HCC)[place]/27/2017  . Chronic left shoulder pain 07/19/2015  . Multiple sclerosis exacerbation (HCC)[place]/12/2015  . MS (multiple sclerosis) (HCC)[place]/21/2016  . Increased body mass index 11/26/2014  . HPV test positive 11/26/2014  . Status post laparoscopic supracervical hysterectomy 11/26/2014  . Galactorrhea 11/26/2014  . Back ache 05/21/2014  . Adiposity 05/21/2014  . Disordered sleep 05/21/2014  . Muscle spasticity 05/21/2014  . Spasticity 05/21/2014  . Calculus of kidney 12/09/2013  . Renal colic 08/022/02/5427Hypercholesteremia 08/19/2013  . Hereditary and idiopathic neuropathy 08/19/2013  . Hypercholesterolemia without hypertriglyceridemia 08/19/2013  . Bladder infection, chronic 07/25/2012  . Disorder of bladder function 07/25/2012  . Incomplete bladder emptying 07/25/2012  . Microscopic hematuria 07/25/2012  . Right upper quadrant pain 07/25/2012   AnnaErick BlinksT  This entire session was performed under direct supervision and direction of a licensed therapist/therapist assistant . I have personally read, edited and approve of the note as written.  KatlMyles Gip DPT #  [id number] 03/22/2018, 12:02 PM  [place] MAIN [facility] SERVICES [address] [place], [place], [address] Phone: [phone]   Fax:  [phone]  Name: Camesha Farooq  Greaser MRN: [id number] Date of Birth: [birth year]

## 2018-03-25 ENCOUNTER — Ambulatory Visit: Payer: PPO | Admitting: Physical Therapy

## 2018-03-25 DIAGNOSIS — R2681 Unsteadiness on feet: Secondary | ICD-10-CM

## 2018-03-25 DIAGNOSIS — M6281 Muscle weakness (generalized): Secondary | ICD-10-CM

## 2018-03-25 DIAGNOSIS — R2689 Other abnormalities of gait and mobility: Secondary | ICD-10-CM

## 2018-03-25 DIAGNOSIS — M5412 Radiculopathy, cervical region: Secondary | ICD-10-CM

## 2018-03-25 NOTE — Therapy (Signed)
[place] MAIN [facility] SERVICES [address] [place], [place], [address] Phone: [phone]   Fax:  [phone]  Physical Therapy Treatment  Patient Details  Name: Lisa Williams MRN: [id number] Date of Birth: 05-30-1960 Referring Provider (PT): Gurney Maxin, MD   Encounter Date: 03/25/2018  PT End of Session - 03/25/18 1127    Visit Number  55    Number of Visits  52    Date for PT Re-Evaluation  04/15/18    Authorization Type  2/10 PN start 11/15     PT Start Time  1115    PT Stop Time  1200    PT Time Calculation (min)  45 min    Equipment Utilized During Treatment  Gait belt    Activity Tolerance  Patient limited by fatigue;Patient tolerated treatment well    Behavior During Therapy  [facility] for tasks assessed/performed       Past Medical History:  Diagnosis Date  . Abdominal pain, right upper quadrant   . Back pain   . Calculus of kidney 12/09/2013  . Chronic back pain    unspecified  . Chronic left shoulder pain 07/19/2015  . Functional disorder of bladder    other  . Galactorrhea 11/26/2014   Chronic   . Hereditary and idiopathic neuropathy 08/19/2013  . HPV test positive   . Hypercholesteremia 08/19/2013  . Incomplete bladder emptying   . Microscopic hematuria   . MS (multiple sclerosis) ([place])   . Muscle spasticity 05/21/2014  . Nonspecific findings on examination of urine    other  . Osteopenia   . Status post laparoscopic supracervical hysterectomy 11/26/2014  . Tobacco user 11/26/2014  . Wrist fracture     Past Surgical History:  Procedure Laterality Date  . bilateral tubal ligation  1996  . BREAST CYST EXCISION Left 2002  . KNEE SURGERY     right  . LAPAROSCOPIC SUPRACERVICAL HYSTERECTOMY  08/05/2013  . ORIF WRIST FRACTURE Left 01/17/2017   Procedure: OPEN REDUCTION INTERNAL FIXATION (ORIF) WRIST FRACTURE;  Surgeon: Lovell Sheehan, MD;  Location: [facility] ORS;  Service: Orthopedics;  Laterality: Left;  . TUBAL  LIGATION Bilateral   . VAGINAL HYSTERECTOMY  03/2006    There were no vitals filed for this visit.  Subjective Assessment - 03/25/18 1116    Subjective  Patient states that she is doing well and has nothing new to report. No falls or LOB. Patient plans to try the tub bench transfer after thanksgiving with assistance at home.    Pertinent History  Pt is a 57 y/o F who presents with BLE weakness and difficulty ambulating due to h/o MS that was diagnosed in 1995. Pt denies any pain associated. Pt was admitted to the hospital in November 2018 due to MS exacerbation. Nov-Dec to HHPT-->[facility] until end of Jan. After rehab the pt was able to ambulate up to 100 ft with the RW. Pt has Bil AFO, her therapist at [facility] thinks she needs a new AFO as her feet still drag when she ambulates, pt reports HHPT mentioned a spring AFO. Pt has custom AFOs that were originally made 4 years ago. Goals: improve balance, to be able to get into the shower with her tub bench without assist, to be able to get up 2 steps at sister's home with railings on both sides but too far apart to reach both sides, to be able to drive adapted car. Pt is staying at other sister's house with one  step to get inside and currently picking up L leg with UEs and then stepping in. Pt steps down leading with LLE. Pt wants to work on walking side to side as this is challenging and she had not yet progressed to this at rehab. Pt able to get in/out the car with assist only to bring BLEs into the car. Hoping to drive by June or July with adapted car (already has one). Pt needs assist bringing LEs into and out of hospital bed. Pt able to perform sit>stand without assist from [facility] with a pillow on the seat for a boost. Pt denies any falls in the past 6 months. Pt has been increasing her time alone at home up to a few hours at a time to become more independent. Pt has both manual and power WC. Uses power WC at home, uses manual WC out in community. Pt  able to self propel manual WC. Pt needs assist putting pants around ankles but she can pull them up. Pt can put on her shoes but needs help getting her leg up to do so. Pt able to tie her shoelaces on her own. Pt has a tub/shower unit at home with a tub bench. No grab bars in the shower. Has hand held shower head. Pt currently taking a sponge bath. Pt has a regular height toilet with a BSC over top. Pt with h/o L wrist fx with no precautions, pt wore her soft brace to evaluation for comfort but reports her wrist has healed and is doing well following OT.     Limitations  Lifting;Standing;Walking;House hold activities    How long can you sit comfortably?  n/a    How long can you stand comfortably?  3 minutes without UE assist, limited by fatigue    How long can you walk comfortably?  100 ft, limited by fatigue    Patient Stated Goals  see above    Currently in Pain?  No/denies    Pain Score  0-No pain        TREATMENT  Therapeutic Exercise: 5 lb bar + 3lb cuff overhead reach  5 lb bar wood chop x10 both directions 5 lb bar rotation x15 each direction 2.2 lb ball pass (around the body) seated on airex x12 each direction 2.2 lb ball wood chop seated on airex x10 each direction 2.2 lb ball vertical toss seated on airex x 20   Standing weight-shift x20 Standing marching w/ emphasis on knee bend 2x10R, 2x10 L Seated knee bends w/ wash cloth under foot x10 each   Patient educated on body mechanics, benefits of trunk strengthening for transfer to other tasks in sitting and standing, and compensatory patterns during movement.     PT Short Term Goals - 03/22/18 1154      PT SHORT TERM GOAL #1   Title  Pt will be completed HEP with min assist from caregiver at least 4 days/wk for improved carryover between sessions    Baseline  HEP compliant     Time  2    Period  Weeks    Status  Achieved      PT SHORT TERM GOAL #2   Title  Patient will report a worst pain of 6/10 on VAS in cervical  spine to improve tolerance with ADLs and reduced symptoms with activities.     Baseline  4/30: 9/10 5/21: 0/10 pain    Time  2    Period  Weeks    Status  Achieved  PT SHORT TERM GOAL #3   Title  Patient will perform cervical rotation at least 50 degrees, flexion and extension to 70 degrees, and sidebending to 40 degrees without pain    Baseline  4/30: R 50 L 34 Rotation, R 26 L 35 sb, Extension 35 flex 35 ; 5/21: extension 46, SB R 41 L 40, rotation R 62 L 61,     Time  2    Period  Weeks    Status  Achieved        PT Long Term Goals - 03/22/18 1050      PT LONG TERM GOAL #1   Title  Pt's 5xSTS will impove to equal to or below 2 minutes to demonstrate improved balance and BLE strength    Baseline  3.02 minutes; 5/15: 1 min 30 seconds 9/26:unable to complete fully secondary to patient not feeling well    Time  6    Period  Weeks    Status  Achieved      PT LONG TERM GOAL #2   Title  Pt's 34mT will improve to at least 0.5 m/s for improved safety ambulating in home    Baseline  0.14 m/s; 5/15: .246m  7/10: .36 m/s  8/20: .42 m/s  9/26: 0.2613m10/14: .26 m/s improved hip and knee clearance 11/15: 0.56m82mmproved hip and knee clearance    Time  8    Period  Weeks    Status  Partially Met      PT LONG TERM GOAL #3   Title  Pt's Bil hip F strength will improve to at least 2+/5 BLEs for improved gait mechanics and safety    Baseline  L: 2/5, R: 1/5 7/10: 2/5 bilaterally  8/20: 2/5 bilat 9/26: 2/5 bilat 10/14: 2/5 bilaterally ,improved hip flexor muscle activation 2/5: improved hip flexor and quad activation    Time  8    Period  Weeks    Status  Partially Met      PT LONG TERM GOAL #4   Title  Pt's ABC Scale will improve to at least 40% to demonstrate improved balance    Baseline  28.13% 5/15; 24.4% 7/10: 23.4% 8/20: 22. 2% 9/26: 25.3% 10/14: 26.5% 11/15: 24.38%    Time  6    Period  Weeks    Status  Partially Met      PT LONG TERM GOAL #5   Title  Pt will be able to  perform car transfer without assist to work towards pt's goal of returning to driving    Baseline  only needs help with one leg to put onto running board to get into sisters car; 8/20: in and out of own car independently.     Time  6    Period  Weeks    Status  Achieved      PT LONG TERM GOAL #6   Title  Pt will be able to perform tub bench into/out of shower transfer without assist for improved independence with this task    Baseline  challenged with steps at this time, getting better, stands and gives sponge bath independently now  9/26: has not tried transfer yet 11/15: has not attempted yet    Time  6    Period  Weeks    Status  Partially Met      PT LONG TERM GOAL #7   Title  Patient will perform cervical rotation at least 60 degrees, flexion and extension to 70 degrees, and  sidebending to 40 degrees without pain    Baseline  4/30: R 50 L 34 Rotation, R 26 L 35 sb, Extension 35 flex 35; 5/21: extension 46, SB R 41 L 40, rotation R 62 L 61,     Time  4    Period  Weeks    Status  Achieved      PT LONG TERM GOAL #8   Title  Patient will improve NDI to <28% to reduce disability to mild  percieved disability for improved quality of life.     Baseline  4/30: 56% severe disability 5/21: 10%    Time  4    Period  Weeks    Status  Achieved      PT LONG TERM GOAL  #9   TITLE  Patient will report a worst pain of 3/10 on VAS in  cervical spine  to improve tolerance with ADLs and reduced symptoms with activities.     Baseline  4/30: 9/10 5/21: 0/10 in neck     Time  4    Period  Weeks    Status  Achieved      PT LONG TERM GOAL  #10   TITLE  Pt's 5xSTS will impove to equal to or below 50 seconds to demonstrate improved balance and BLE strength    Baseline  5/15: 1 min 30.9 seconds 7/10: 64 seconds  8/20: 1 min 29 seconds with L foot frequently slipping out requiring patient to perform multiple attempts per one stand 10/10: 36 seconds     Time  8    Period  Weeks    Status  Achieved       PT LONG TERM GOAL  #11   TITLE  Patient will descend/ascend down a handicap ramp  with RW and with supervision to increase independence in home environment.     Baseline  7/10 can ascend, unable to descend ; 8/20: achieved on 8/31    Time  6    Period  Weeks    Status  Achieved      PT LONG TERM GOAL  #12   TITLE  Patient will negotiate one step ~4 inches to enter house independently to increase functional independence in natural environment.     Baseline  8/20: unable to do 9/26: patient too fatigued/not feeling well to perform 10/14: unable to perform at this time 11/15: unable to perform at this time    Time  8    Period  Weeks    Status  On-going      PT LONG TERM GOAL  #13   TITLE  Patient will perform STS from standard height surface to walker to allow patient to participate and transition in public areas when not in wheelchair.     Baseline  10/14: able to perform in W/C which is raised slightly 11/15: 20inch height    Time  8    Period  Weeks    Status  Partially Met    Target Date  04/15/18            Plan - 03/25/18 1218    Clinical Impression Statement Patient presents to clinic amenable to therapy with deficits to strength and balance in sitting and standing positions as the focus of today's session. Patient tolerated all trunk strengthening activities and was able to transfer today with SBA to/from the W/C to standing and to/from an elevated plinth. Patient continues to have compensatory patterns when initiating gait or knee flexion in  standing, which will benefit from further intervention. Patient will continue to benefit from skilled physical therapy to improve mobility and overall QOL.   Rehab Potential  Good    PT Frequency  2x / week    PT Duration  8 weeks    PT Treatment/Interventions  ADLs/Self Care Home Management;Aquatic Therapy;Biofeedback;Cryotherapy;Electrical Stimulation;Iontophoresis 68m/ml Dexamethasone;Traction;Moist Heat;Ultrasound;DME  Instruction;Gait training;Stair training;Functional mobility training;Therapeutic activities;Therapeutic exercise;Balance training;Neuromuscular re-education;Patient/family education;Orthotic Fit/Training;Wheelchair mobility training;Manual techniques;Compression bandaging;Passive range of motion;Dry needling;Energy conservation;Taping;Splinting    PT Next Visit Plan  Outcome measures, goals, and progress note, continue strengthening/balance    PT Home Exercise Plan  to initiate next session (please incorporate hip flexor and adductor stretches in HEP if possible -KG)    Consulted and Agree with Plan of Care  Patient       Patient will benefit from skilled therapeutic intervention in order to improve the following deficits and impairments:  Abnormal gait, Decreased activity tolerance, Decreased balance, Decreased coordination, Decreased endurance, Decreased knowledge of use of DME, Decreased mobility, Decreased range of motion, Decreased safety awareness, Decreased strength, Difficulty walking, Impaired perceived functional ability, Impaired sensation, Impaired tone, Impaired UE functional use, Improper body mechanics, Postural dysfunction  Visit Diagnosis: Muscle weakness (generalized)  Unsteadiness on feet  Other abnormalities of gait and mobility  Radiculopathy, cervical region     Problem List Patient Active Problem List   Diagnosis Date Noted  . Radial styloid tenosynovitis 03/12/2018  . Wheelchair confinement 02/27/2018  . Localized osteoporosis with current pathological fracture with routine healing 01/19/2017  . Wrist fracture 01/16/2017  . Sprain of ankle 03/23/2016  . Closed fracture of lateral malleolus 03/16/2016  . Health care maintenance 01/24/2016  . Blood pressure elevated without history of HTN 10/25/2015  . Essential hypertension 10/25/2015  . Multiple sclerosis ([place] 10/02/2015  . Chronic left shoulder pain 07/19/2015  . [place]  07/14/2015  . MS (multiple sclerosis) ([place] 11/26/2014  . Increased body mass index 11/26/2014  . HPV test positive 11/26/2014  . Status post laparoscopic supracervical hysterectomy 11/26/2014  . Galactorrhea 11/26/2014  . Back ache 05/21/2014  . Adiposity 05/21/2014  . Disordered sleep 05/21/2014  . Muscle spasticity 05/21/2014  . Spasticity 05/21/2014  . Calculus of kidney 12/09/2013  . Renal colic 060/45/4098 . Hypercholesteremia 08/19/2013  . Hereditary and idiopathic neuropathy 08/19/2013  . Hypercholesterolemia without hypertriglyceridemia 08/19/2013  . Bladder infection, chronic 07/25/2012  . Disorder of bladder function 07/25/2012  . Incomplete bladder emptying 07/25/2012  . Microscopic hematuria 07/25/2012  . Right upper quadrant pain 07/25/2012   KMyles GipPT, DPT #[phone]/18/2019, 12:35 PM  [place] [facility] [address] [place] [address]Phone: [phone]  Fax:  [phone] Name: JSharnee DouglassRamseur MRN: [id number] of Birth: 606/23/1962

## 2018-03-28 ENCOUNTER — Ambulatory Visit: Payer: PPO

## 2018-03-28 DIAGNOSIS — M6281 Muscle weakness (generalized): Secondary | ICD-10-CM

## 2018-03-28 DIAGNOSIS — R2681 Unsteadiness on feet: Secondary | ICD-10-CM

## 2018-03-28 DIAGNOSIS — R2689 Other abnormalities of gait and mobility: Secondary | ICD-10-CM

## 2018-03-28 NOTE — Therapy (Signed)
[place] MAIN [facility] SERVICES [address] [place], [place], [address] Phone: [phone]   Fax:  [phone]  Physical Therapy Treatment  Patient Details  Name: Lisa Williams MRN: [id number] Date of Birth: Aug 31, 1960 Referring Provider (PT): Gurney Maxin, MD   Encounter Date: 03/28/2018  PT End of Session - 03/28/18 1045    Visit Number  [id number]    Number of Visits  70    Date for PT Re-Evaluation  04/15/18    Authorization Type  2/10 PN start 11/15     PT Start Time  1032    PT Stop Time  1115    PT Time Calculation (min)  43 min    Equipment Utilized During Treatment  Gait belt    Activity Tolerance  Patient limited by fatigue;Patient tolerated treatment well    Behavior During Therapy  [facility] for tasks assessed/performed       Past Medical History:  Diagnosis Date  . Abdominal pain, right upper quadrant   . Back pain   . Calculus of kidney 12/09/2013  . Chronic back pain    unspecified  . Chronic left shoulder pain 07/19/2015  . Functional disorder of bladder    other  . Galactorrhea 11/26/2014   Chronic   . Hereditary and idiopathic neuropathy 08/19/2013  . HPV test positive   . Hypercholesteremia 08/19/2013  . Incomplete bladder emptying   . Microscopic hematuria   . MS (multiple sclerosis) ([place])   . Muscle spasticity 05/21/2014  . Nonspecific findings on examination of urine    other  . Osteopenia   . Status post laparoscopic supracervical hysterectomy 11/26/2014  . Tobacco user 11/26/2014  . Wrist fracture     Past Surgical History:  Procedure Laterality Date  . bilateral tubal ligation  1996  . BREAST CYST EXCISION Left 2002  . KNEE SURGERY     right  . LAPAROSCOPIC SUPRACERVICAL HYSTERECTOMY  08/05/2013  . ORIF WRIST FRACTURE Left 01/17/2017   Procedure: OPEN REDUCTION INTERNAL FIXATION (ORIF) WRIST FRACTURE;  Surgeon: Lovell Sheehan, MD;  Location: [facility] ORS;  Service: Orthopedics;  Laterality: Left;  . TUBAL  LIGATION Bilateral   . VAGINAL HYSTERECTOMY  03/2006    There were no vitals filed for this visit.  Subjective Assessment - 03/28/18 1033    Subjective  Patient reports no falls but did report experiencing an episode in which controlled lowering was required.  No pain reported at initiation of session.    Pertinent History  Pt is a 57 y/o F who presents with BLE weakness and difficulty ambulating due to h/o MS that was diagnosed in 1995. Pt denies any pain associated. Pt was admitted to the hospital in November 2018 due to MS exacerbation. Nov-Dec to HHPT-->[facility] until end of Jan. After rehab the pt was able to ambulate up to 100 ft with the RW. Pt has Bil AFO, her therapist at [place] thinks she needs a new AFO as her feet still drag when she ambulates, pt reports HHPT mentioned a spring AFO. Pt has custom AFOs that were originally made 4 years ago. Goals: improve balance, to be able to get into the shower with her tub bench without assist, to be able to get up 2 steps at sister's home with railings on both sides but too far apart to reach both sides, to be able to drive adapted car. Pt is staying at other sister's house with one step to get inside and currently picking  up L leg with UEs and then stepping in. Pt steps down leading with LLE. Pt wants to work on walking side to side as this is challenging and she had not yet progressed to this at rehab. Pt able to get in/out the car with assist only to bring BLEs into the car. Hoping to drive by June or July with adapted car (already has one). Pt needs assist bringing LEs into and out of hospital bed. Pt able to perform sit>stand without assist from [facility] with a pillow on the seat for a boost. Pt denies any falls in the past 6 months. Pt has been increasing her time alone at home up to a few hours at a time to become more independent. Pt has both manual and power WC. Uses power WC at home, uses manual WC out in community. Pt able to self propel manual  WC. Pt needs assist putting pants around ankles but she can pull them up. Pt can put on her shoes but needs help getting her leg up to do so. Pt able to tie her shoelaces on her own. Pt has a tub/shower unit at home with a tub bench. No grab bars in the shower. Has hand held shower head. Pt currently taking a sponge bath. Pt has a regular height toilet with a BSC over top. Pt with h/o L wrist fx with no precautions, pt wore her soft brace to evaluation for comfort but reports her wrist has healed and is doing well following OT.     Limitations  Lifting;Standing;Walking;House hold activities    How long can you sit comfortably?  n/a    How long can you stand comfortably?  3 minutes without UE assist, limited by fatigue    How long can you walk comfortably?  100 ft, limited by fatigue    Patient Stated Goals  see above    Currently in Pain?  No/denies        parallel bar walking CGA 8 lengths with one seated rest break, ice pack applied to abdomen following for thermoregulation during rest breaks, x3 min.  STS x4 with min A and UE assistance using parallel bars; VC's for controlled lowering.  Standing toe taps in parallel bars bilateral LE's x10 each; VC's for encouragement and technique.  Standing single leg marches for focus on hip flexion and foot clearance during gait, x10 bilateral LE's with UE support using parallel bars and CGA.  VC's to be mindful of wt shift and to sit and rest when pt became fatigued.  Triceps transfers x15 sitting in WC, feet supported on floor. Wrist brace donned on L UE.  Seated balloon taps, back away from back of chair for upright positioning reaching inside and outside BOS for trunk stability and challenge. 2 minutes   Seated manually resisted isometric hip flexion each LE with 3 sec hold x15/total fatigue.  3000 g weighted ball vertical raises with upright posture, avoiding leaning into back of chair x15 B UE.  3000 g weighted ball toss with B UE x10 with  upright posture in chair.                                PT Education - 03/28/18 1044    Education provided  Yes    Education Details  exercise technique, stability, goals    Person(s) Educated  Patient    Methods  Explanation;Demonstration;Verbal cues    Comprehension  Verbalized understanding;Returned demonstration       PT Short Term Goals - 03/22/18 1154      PT SHORT TERM GOAL #1   Title  Pt will be completed HEP with min assist from caregiver at least 4 days/wk for improved carryover between sessions    Baseline  HEP compliant     Time  2    Period  Weeks    Status  Achieved      PT SHORT TERM GOAL #2   Title  Patient will report a worst pain of 6/10 on VAS in cervical spine to improve tolerance with ADLs and reduced symptoms with activities.     Baseline  4/30: 9/10 5/21: 0/10 pain    Time  2    Period  Weeks    Status  Achieved      PT SHORT TERM GOAL #3   Title  Patient will perform cervical rotation at least 50 degrees, flexion and extension to 70 degrees, and sidebending to 40 degrees without pain    Baseline  4/30: R 50 L 34 Rotation, R 26 L 35 sb, Extension 35 flex 35 ; 5/21: extension 46, SB R 41 L 40, rotation R 62 L 61,     Time  2    Period  Weeks    Status  Achieved        PT Long Term Goals - 03/22/18 1050      PT LONG TERM GOAL #1   Title  Pt's 5xSTS will impove to equal to or below 2 minutes to demonstrate improved balance and BLE strength    Baseline  3.02 minutes; 5/15: 1 min 30 seconds 9/26:unable to complete fully secondary to patient not feeling well    Time  6    Period  Weeks    Status  Achieved      PT LONG TERM GOAL #2   Title  Pt's 66mT will improve to at least 0.5 m/s for improved safety ambulating in home    Baseline  0.14 m/s; 5/15: .261m  7/10: .36 m/s  8/20: .42 m/s  9/26: 0.2677m10/14: .26 m/s improved hip and knee clearance 11/15: 0.59m79mmproved hip and knee clearance    Time  8    Period  Weeks     Status  Partially Met      PT LONG TERM GOAL #3   Title  Pt's Bil hip F strength will improve to at least 2+/5 BLEs for improved gait mechanics and safety    Baseline  L: 2/5, R: 1/5 7/10: 2/5 bilaterally  8/20: 2/5 bilat 9/26: 2/5 bilat 10/14: 2/5 bilaterally ,improved hip flexor muscle activation 2/5: improved hip flexor and quad activation    Time  8    Period  Weeks    Status  Partially Met      PT LONG TERM GOAL #4   Title  Pt's ABC Scale will improve to at least 40% to demonstrate improved balance    Baseline  28.13% 5/15; 24.4% 7/10: 23.4% 8/20: 22. 2% 9/26: 25.3% 10/14: 26.5% 11/15: 24.38%    Time  6    Period  Weeks    Status  Partially Met      PT LONG TERM GOAL #5   Title  Pt will be able to perform car transfer without assist to work towards pt's goal of returning to driving    Baseline  only needs help with one leg to put onto running board to  get into sisters car; 8/20: in and out of own car independently.     Time  6    Period  Weeks    Status  Achieved      PT LONG TERM GOAL #6   Title  Pt will be able to perform tub bench into/out of shower transfer without assist for improved independence with this task    Baseline  challenged with steps at this time, getting better, stands and gives sponge bath independently now  9/26: has not tried transfer yet 11/15: has not attempted yet    Time  6    Period  Weeks    Status  Partially Met      PT LONG TERM GOAL #7   Title  Patient will perform cervical rotation at least 60 degrees, flexion and extension to 70 degrees, and sidebending to 40 degrees without pain    Baseline  4/30: R 50 L 34 Rotation, R 26 L 35 sb, Extension 35 flex 35; 5/21: extension 46, SB R 41 L 40, rotation R 62 L 61,     Time  4    Period  Weeks    Status  Achieved      PT LONG TERM GOAL #8   Title  Patient will improve NDI to <28% to reduce disability to mild  percieved disability for improved quality of life.     Baseline  4/30: 56% severe  disability 5/21: 10%    Time  4    Period  Weeks    Status  Achieved      PT LONG TERM GOAL  #9   TITLE  Patient will report a worst pain of 3/10 on VAS in  cervical spine  to improve tolerance with ADLs and reduced symptoms with activities.     Baseline  4/30: 9/10 5/21: 0/10 in neck     Time  4    Period  Weeks    Status  Achieved      PT LONG TERM GOAL  #10   TITLE  Pt's 5xSTS will impove to equal to or below 50 seconds to demonstrate improved balance and BLE strength    Baseline  5/15: 1 min 30.9 seconds 7/10: 64 seconds  8/20: 1 min 29 seconds with L foot frequently slipping out requiring patient to perform multiple attempts per one stand 10/10: 36 seconds     Time  8    Period  Weeks    Status  Achieved      PT LONG TERM GOAL  #11   TITLE  Patient will descend/ascend down a handicap ramp  with RW and with supervision to increase independence in home environment.     Baseline  7/10 can ascend, unable to descend ; 8/20: achieved on 8/31    Time  6    Period  Weeks    Status  Achieved      PT LONG TERM GOAL  #12   TITLE  Patient will negotiate one step ~4 inches to enter house independently to increase functional independence in natural environment.     Baseline  8/20: unable to do 9/26: patient too fatigued/not feeling well to perform 10/14: unable to perform at this time 11/15: unable to perform at this time    Time  8    Period  Weeks    Status  On-going      PT LONG TERM GOAL  #13   TITLE  Patient will perform STS from  standard height surface to walker to allow patient to participate and transition in public areas when not in wheelchair.     Baseline  10/14: able to perform in W/C which is raised slightly 11/15: 20inch height    Time  8    Period  Weeks    Status  Partially Met    Target Date  04/15/18            Plan - 03/28/18 1121    Clinical Impression Statement  Patient eager to work with therapy today and demonstrated improvement in transfers and standing  gait mechanics.  Pt reported subjective improvement, stating "my endurance is getting better".  She was able to ambulate 4x in parallel bars and perform standing hip flexion with need for VC's for body mechanics.  Pt required 4 rest breaks with cold pack for thermoregulation.  Patient does require assistance to rise from [facility] and to control descent following pt becoming fatigued.  She added 15 WC dips without a rest break to treatment today, reporting fatigue at the end.  She was able to complete all there ex with min VC's for technique and posture.  Patient will continue to benefit from skilled physical therapy to improve mobility and overall QOL.    Rehab Potential  Good    PT Frequency  2x / week    PT Duration  8 weeks    PT Treatment/Interventions  ADLs/Self Care Home Management;Aquatic Therapy;Biofeedback;Cryotherapy;Electrical Stimulation;Iontophoresis 76m/ml Dexamethasone;Traction;Moist Heat;Ultrasound;DME Instruction;Gait training;Stair training;Functional mobility training;Therapeutic activities;Therapeutic exercise;Balance training;Neuromuscular re-education;Patient/family education;Orthotic Fit/Training;Wheelchair mobility training;Manual techniques;Compression bandaging;Passive range of motion;Dry needling;Energy conservation;Taping;Splinting    PT Next Visit Plan  Outcome measures, goals, and progress note, continue strengthening/balance    PT Home Exercise Plan  to initiate next session (please incorporate hip flexor and adductor stretches in HEP if possible -KG)    Consulted and Agree with Plan of Care  Patient       Patient will benefit from skilled therapeutic intervention in order to improve the following deficits and impairments:  Abnormal gait, Decreased activity tolerance, Decreased balance, Decreased coordination, Decreased endurance, Decreased knowledge of use of DME, Decreased mobility, Decreased range of motion, Decreased safety awareness, Decreased strength, Difficulty walking,  Impaired perceived functional ability, Impaired sensation, Impaired tone, Impaired UE functional use, Improper body mechanics, Postural dysfunction  Visit Diagnosis: Muscle weakness (generalized)  Unsteadiness on feet  Other abnormalities of gait and mobility     Problem List Patient Active Problem List   Diagnosis Date Noted  . Radial styloid tenosynovitis 03/12/2018  . Wheelchair confinement 02/27/2018  . Localized osteoporosis with current pathological fracture with routine healing 01/19/2017  . Wrist fracture 01/16/2017  . Sprain of ankle 03/23/2016  . Closed fracture of lateral malleolus 03/16/2016  . Health care maintenance 01/24/2016  . Blood pressure elevated without history of HTN 10/25/2015  . Essential hypertension 10/25/2015  . Multiple sclerosis ([place] 10/02/2015  . Chronic left shoulder pain 07/19/2015  . [place] sclerosis exacerbation ([place] 07/14/2015  . MS (multiple sclerosis) ([place] 11/26/2014  . Increased body mass index 11/26/2014  . HPV test positive 11/26/2014  . Status post laparoscopic supracervical hysterectomy 11/26/2014  . Galactorrhea 11/26/2014  . Back ache 05/21/2014  . Adiposity 05/21/2014  . Disordered sleep 05/21/2014  . Muscle spasticity 05/21/2014  . Spasticity 05/21/2014  . Calculus of kidney 12/09/2013  . Renal colic [id number]/[id number] . Hypercholesteremia 08/19/2013  . Hereditary and idiopathic neuropathy 08/19/2013  . Hypercholesterolemia without hypertriglyceridemia 08/19/2013  . Bladder infection, chronic 07/25/2012  .  Disorder of bladder function 07/25/2012  . Incomplete bladder emptying 07/25/2012  . Microscopic hematuria 07/25/2012  . Right upper quadrant pain 07/25/2012   Janna Arch, PT, DPT   03/28/2018, 12:31 PM  [place] MAIN [facility] SERVICES [address] [place], [place], [address] Phone: [phone]   Fax:  [phone]  Name: Adelayde Minney Peto MRN: [id number] Date of  Birth: 1960/08/30

## 2018-03-29 DIAGNOSIS — G35 Multiple sclerosis: Secondary | ICD-10-CM | POA: Diagnosis not present

## 2018-04-03 ENCOUNTER — Ambulatory Visit: Payer: PPO

## 2018-04-03 DIAGNOSIS — M6281 Muscle weakness (generalized): Secondary | ICD-10-CM

## 2018-04-03 DIAGNOSIS — R2689 Other abnormalities of gait and mobility: Secondary | ICD-10-CM

## 2018-04-03 DIAGNOSIS — R2681 Unsteadiness on feet: Secondary | ICD-10-CM

## 2018-04-03 NOTE — Therapy (Signed)
[place] MAIN [facility] SERVICES [address] [place], [place], [address] Phone: [phone]   Fax:  [phone]  Physical Therapy Treatment  Patient Details  Name: Lisa Williams MRN: [id number] Date of Birth: June 02, 1960 Referring Provider (PT): Gurney Maxin, MD   Encounter Date: 04/03/2018  PT End of Session - 04/03/18 1238    Visit Number  60    Number of Visits  64    Date for PT Re-Evaluation  04/15/18    Authorization Type  3/10 PN start 11/15     PT Start Time  1115    PT Stop Time  1159    PT Time Calculation (min)  44 min    Equipment Utilized During Treatment  Gait belt    Activity Tolerance  Patient limited by fatigue;Patient tolerated treatment well    Behavior During Therapy  [facility] for tasks assessed/performed       Past Medical History:  Diagnosis Date  . Abdominal pain, right upper quadrant   . Back pain   . Calculus of kidney 12/09/2013  . Chronic back pain    unspecified  . Chronic left shoulder pain 07/19/2015  . Functional disorder of bladder    other  . Galactorrhea 11/26/2014   Chronic   . Hereditary and idiopathic neuropathy 08/19/2013  . HPV test positive   . Hypercholesteremia 08/19/2013  . Incomplete bladder emptying   . Microscopic hematuria   . MS (multiple sclerosis) ([place])   . Muscle spasticity 05/21/2014  . Nonspecific findings on examination of urine    other  . Osteopenia   . Status post laparoscopic supracervical hysterectomy 11/26/2014  . Tobacco user 11/26/2014  . Wrist fracture     Past Surgical History:  Procedure Laterality Date  . bilateral tubal ligation  1996  . BREAST CYST EXCISION Left 2002  . KNEE SURGERY     right  . LAPAROSCOPIC SUPRACERVICAL HYSTERECTOMY  08/05/2013  . ORIF WRIST FRACTURE Left 01/17/2017   Procedure: OPEN REDUCTION INTERNAL FIXATION (ORIF) WRIST FRACTURE;  Surgeon: Lovell Sheehan, MD;  Location: [facility] ORS;  Service: Orthopedics;  Laterality: Left;  . TUBAL  LIGATION Bilateral   . VAGINAL HYSTERECTOMY  03/2006    There were no vitals filed for this visit.  Subjective Assessment - 04/03/18 1234    Subjective  Patient reports she is doing well today. Has been compliant with HEP and no stumbles and falls since last session.     Pertinent History  Pt is a 57 y/o F who presents with BLE weakness and difficulty ambulating due to h/o MS that was diagnosed in 1995. Pt denies any pain associated. Pt was admitted to the hospital in November 2018 due to MS exacerbation. Nov-Dec to HHPT-->[facility] until end of Jan. After rehab the pt was able to ambulate up to 100 ft with the RW. Pt has Bil AFO, her therapist at [place] thinks she needs a new AFO as her feet still drag when she ambulates, pt reports HHPT mentioned a spring AFO. Pt has custom AFOs that were originally made 4 years ago. Goals: improve balance, to be able to get into the shower with her tub bench without assist, to be able to get up 2 steps at sister's home with railings on both sides but too far apart to reach both sides, to be able to drive adapted car. Pt is staying at other sister's house with one step to get inside and currently picking up L leg  with UEs and then stepping in. Pt steps down leading with LLE. Pt wants to work on walking side to side as this is challenging and she had not yet progressed to this at rehab. Pt able to get in/out the car with assist only to bring BLEs into the car. Hoping to drive by June or July with adapted car (already has one). Pt needs assist bringing LEs into and out of hospital bed. Pt able to perform sit>stand without assist from [facility] with a pillow on the seat for a boost. Pt denies any falls in the past 6 months. Pt has been increasing her time alone at home up to a few hours at a time to become more independent. Pt has both manual and power WC. Uses power WC at home, uses manual WC out in community. Pt able to self propel manual WC. Pt needs assist putting pants  around ankles but she can pull them up. Pt can put on her shoes but needs help getting her leg up to do so. Pt able to tie her shoelaces on her own. Pt has a tub/shower unit at home with a tub bench. No grab bars in the shower. Has hand held shower head. Pt currently taking a sponge bath. Pt has a regular height toilet with a BSC over top. Pt with h/o L wrist fx with no precautions, pt wore her soft brace to evaluation for comfort but reports her wrist has healed and is doing well following OT.     Limitations  Lifting;Standing;Walking;House hold activities    How long can you sit comfortably?  n/a    How long can you stand comfortably?  3 minutes without UE assist, limited by fatigue    How long can you walk comfortably?  100 ft, limited by fatigue    Patient Stated Goals  see above    Currently in Pain?  No/denies         parallel bar walking CGA 8 lengths with one seated rest break, ice pack applied to abdomen following for thermoregulation during rest breaks, x3 min.   STS x4 with min A and UE assistance using parallel bars; VC's for controlled lowering.  Standing weight shift x10 each side.    Standing toe taps in parallel bars bilateral LE's x10 each; VC's for encouragement and technique.   Standing single leg marches for focus on hip flexion and foot clearance during gait, x10 bilateral LE's with UE support using parallel bars and CGA.  VC's to be mindful of wt shift and to sit and rest when pt became fatigued.   5 mini squats with BUE support in // bars with w/c behind    Seated balloon taps, back away from back of chair for upright positioning reaching inside and outside BOS for trunk stability and challenge. 2 minutes    Seated manually resisted isometric hip flexion, abduction, adduction each LE with 3 sec hold x15/total fatigue.   3000 g weighted ball vertical raises with upright posture, avoiding leaning into back of chair x15 B UE.   3000 g weighted ball toss with B UE x10  with upright posture in chair.                       PT Education - 04/03/18 1238    Education provided  Yes    Education Details  excercise technique, stability, goals.     Person(s) Educated  Patient    Methods  Explanation;Demonstration;Verbal cues  Comprehension  Verbalized understanding;Returned demonstration       PT Short Term Goals - 03/22/18 1154      PT SHORT TERM GOAL #1   Title  Pt will be completed HEP with min assist from caregiver at least 4 days/wk for improved carryover between sessions    Baseline  HEP compliant     Time  2    Period  Weeks    Status  Achieved      PT SHORT TERM GOAL #2   Title  Patient will report a worst pain of 6/10 on VAS in cervical spine to improve tolerance with ADLs and reduced symptoms with activities.     Baseline  4/30: 9/10 5/21: 0/10 pain    Time  2    Period  Weeks    Status  Achieved      PT SHORT TERM GOAL #3   Title  Patient will perform cervical rotation at least 50 degrees, flexion and extension to 70 degrees, and sidebending to 40 degrees without pain    Baseline  4/30: R 50 L 34 Rotation, R 26 L 35 sb, Extension 35 flex 35 ; 5/21: extension 46, SB R 41 L 40, rotation R 62 L 61,     Time  2    Period  Weeks    Status  Achieved        PT Long Term Goals - 03/22/18 1050      PT LONG TERM GOAL #1   Title  Pt's 5xSTS will impove to equal to or below 2 minutes to demonstrate improved balance and BLE strength    Baseline  3.02 minutes; 5/15: 1 min 30 seconds 9/26:unable to complete fully secondary to patient not feeling well    Time  6    Period  Weeks    Status  Achieved      PT LONG TERM GOAL #2   Title  Pt's 89mT will improve to at least 0.5 m/s for improved safety ambulating in home    Baseline  0.14 m/s; 5/15: .247m  7/10: .36 m/s  8/20: .42 m/s  9/26: 0.2659m10/14: .26 m/s improved hip and knee clearance 11/15: 0.10m66mmproved hip and knee clearance    Time  8    Period  Weeks     Status  Partially Met      PT LONG TERM GOAL #3   Title  Pt's Bil hip F strength will improve to at least 2+/5 BLEs for improved gait mechanics and safety    Baseline  L: 2/5, R: 1/5 7/10: 2/5 bilaterally  8/20: 2/5 bilat 9/26: 2/5 bilat 10/14: 2/5 bilaterally ,improved hip flexor muscle activation 2/5: improved hip flexor and quad activation    Time  8    Period  Weeks    Status  Partially Met      PT LONG TERM GOAL #4   Title  Pt's ABC Scale will improve to at least 40% to demonstrate improved balance    Baseline  28.13% 5/15; 24.4% 7/10: 23.4% 8/20: 22. 2% 9/26: 25.3% 10/14: 26.5% 11/15: 24.38%    Time  6    Period  Weeks    Status  Partially Met      PT LONG TERM GOAL #5   Title  Pt will be able to perform car transfer without assist to work towards pt's goal of returning to driving    Baseline  only needs help with one leg to put onto running  board to get into sisters car; 8/20: in and out of own car independently.     Time  6    Period  Weeks    Status  Achieved      PT LONG TERM GOAL #6   Title  Pt will be able to perform tub bench into/out of shower transfer without assist for improved independence with this task    Baseline  challenged with steps at this time, getting better, stands and gives sponge bath independently now  9/26: has not tried transfer yet 11/15: has not attempted yet    Time  6    Period  Weeks    Status  Partially Met      PT LONG TERM GOAL #7   Title  Patient will perform cervical rotation at least 60 degrees, flexion and extension to 70 degrees, and sidebending to 40 degrees without pain    Baseline  4/30: R 50 L 34 Rotation, R 26 L 35 sb, Extension 35 flex 35; 5/21: extension 46, SB R 41 L 40, rotation R 62 L 61,     Time  4    Period  Weeks    Status  Achieved      PT LONG TERM GOAL #8   Title  Patient will improve NDI to <28% to reduce disability to mild  percieved disability for improved quality of life.     Baseline  4/30: 56% severe disability  5/21: 10%    Time  4    Period  Weeks    Status  Achieved      PT LONG TERM GOAL  #9   TITLE  Patient will report a worst pain of 3/10 on VAS in  cervical spine  to improve tolerance with ADLs and reduced symptoms with activities.     Baseline  4/30: 9/10 5/21: 0/10 in neck     Time  4    Period  Weeks    Status  Achieved      PT LONG TERM GOAL  #10   TITLE  Pt's 5xSTS will impove to equal to or below 50 seconds to demonstrate improved balance and BLE strength    Baseline  5/15: 1 min 30.9 seconds 7/10: 64 seconds  8/20: 1 min 29 seconds with L foot frequently slipping out requiring patient to perform multiple attempts per one stand 10/10: 36 seconds     Time  8    Period  Weeks    Status  Achieved      PT LONG TERM GOAL  #11   TITLE  Patient will descend/ascend down a handicap ramp  with RW and with supervision to increase independence in home environment.     Baseline  7/10 can ascend, unable to descend ; 8/20: achieved on 8/31    Time  6    Period  Weeks    Status  Achieved      PT LONG TERM GOAL  #12   TITLE  Patient will negotiate one step ~4 inches to enter house independently to increase functional independence in natural environment.     Baseline  8/20: unable to do 9/26: patient too fatigued/not feeling well to perform 10/14: unable to perform at this time 11/15: unable to perform at this time    Time  8    Period  Weeks    Status  On-going      PT LONG TERM GOAL  #13   TITLE  Patient will perform  STS from standard height surface to walker to allow patient to participate and transition in public areas when not in wheelchair.     Baseline  10/14: able to perform in W/C which is raised slightly 11/15: 20inch height    Time  8    Period  Weeks    Status  Partially Met    Target Date  04/15/18            Plan - 04/03/18 1243    Clinical Impression Statement  Patient introduced into mini squats and weight shifts demonstrating improved coordination and muscle  control for progression to dynamic stability and mobility. Patient able to bend bilateral knees without LOB. Patient continues to have compensatory patterns when initiating gait or knee flexion in standing, which will benefit from further intervention. Patient will continue to benefit from skilled physical therapy to improve mobility and overall QOL.    Rehab Potential  Good    PT Frequency  2x / week    PT Duration  8 weeks    PT Treatment/Interventions  ADLs/Self Care Home Management;Aquatic Therapy;Biofeedback;Cryotherapy;Electrical Stimulation;Iontophoresis 13m/ml Dexamethasone;Traction;Moist Heat;Ultrasound;DME Instruction;Gait training;Stair training;Functional mobility training;Therapeutic activities;Therapeutic exercise;Balance training;Neuromuscular re-education;Patient/family education;Orthotic Fit/Training;Wheelchair mobility training;Manual techniques;Compression bandaging;Passive range of motion;Dry needling;Energy conservation;Taping;Splinting    PT Next Visit Plan  Outcome measures, goals, and progress note, continue strengthening/balance    PT Home Exercise Plan  to initiate next session (please incorporate hip flexor and adductor stretches in HEP if possible -KG)    Consulted and Agree with Plan of Care  Patient       Patient will benefit from skilled therapeutic intervention in order to improve the following deficits and impairments:  Abnormal gait, Decreased activity tolerance, Decreased balance, Decreased coordination, Decreased endurance, Decreased knowledge of use of DME, Decreased mobility, Decreased range of motion, Decreased safety awareness, Decreased strength, Difficulty walking, Impaired perceived functional ability, Impaired sensation, Impaired tone, Impaired UE functional use, Improper body mechanics, Postural dysfunction  Visit Diagnosis: Muscle weakness (generalized)  Unsteadiness on feet  Other abnormalities of gait and mobility     Problem List Patient Active  Problem List   Diagnosis Date Noted  . Radial styloid tenosynovitis 03/12/2018  . Wheelchair confinement 02/27/2018  . Localized osteoporosis with current pathological fracture with routine healing 01/19/2017  . Wrist fracture 01/16/2017  . Sprain of ankle 03/23/2016  . Closed fracture of lateral malleolus 03/16/2016  . Health care maintenance 01/24/2016  . Blood pressure elevated without history of HTN 10/25/2015  . Essential hypertension 10/25/2015  . Multiple sclerosis ([place] 10/02/2015  . Chronic left shoulder pain 07/19/2015  . Multiple sclerosis exacerbation ([place] 07/14/2015  . MS (multiple sclerosis) ([place] 11/26/2014  . Increased body mass index 11/26/2014  . HPV test positive 11/26/2014  . Status post laparoscopic supracervical hysterectomy 11/26/2014  . Galactorrhea 11/26/2014  . Back ache 05/21/2014  . Adiposity 05/21/2014  . Disordered sleep 05/21/2014  . Muscle spasticity 05/21/2014  . Spasticity 05/21/2014  . Calculus of kidney 12/09/2013  . Renal colic 016/96/7893 . Hypercholesteremia 08/19/2013  . Hereditary and idiopathic neuropathy 08/19/2013  . Hypercholesterolemia without hypertriglyceridemia 08/19/2013  . Bladder infection, chronic 07/25/2012  . Disorder of bladder function 07/25/2012  . Incomplete bladder emptying 07/25/2012  . Microscopic hematuria 07/25/2012  . Right upper quadrant pain 07/25/2012   MJanna Arch PT, DPT   04/03/2018, 12:44 PM  [facility] [address]  [place] [address]Phone: [phone]  Fax:  [phone]  Name: Lisa Williams MRN: [id number] Date of Birth: 1960/09/06

## 2018-04-09 ENCOUNTER — Ambulatory Visit: Payer: PPO | Attending: Neurology

## 2018-04-09 DIAGNOSIS — R2681 Unsteadiness on feet: Secondary | ICD-10-CM

## 2018-04-09 DIAGNOSIS — M6281 Muscle weakness (generalized): Secondary | ICD-10-CM | POA: Insufficient documentation

## 2018-04-09 DIAGNOSIS — R2689 Other abnormalities of gait and mobility: Secondary | ICD-10-CM | POA: Insufficient documentation

## 2018-04-09 NOTE — Therapy (Signed)
[place] MAIN [facility] SERVICES [address] [place], [place], [address] Phone: [phone]   Fax:  [phone]  Physical Therapy Treatment  Patient Details  Name: Lisa Williams MRN: [id number] Date of Birth: 1961/04/08 Referring Provider (PT): Gurney Maxin, MD   Encounter Date: 04/09/2018  PT End of Session - 04/09/18 1026    Visit Number  61    Number of Visits  109    Date for PT Re-Evaluation  04/15/18    Authorization Type  4/10 PN start 11/15     PT Start Time  1014    PT Stop Time  1059    PT Time Calculation (min)  45 min    Equipment Utilized During Treatment  Gait belt    Activity Tolerance  Patient limited by fatigue;Patient tolerated treatment well    Behavior During Therapy  [facility] for tasks assessed/performed       Past Medical History:  Diagnosis Date  . Abdominal pain, right upper quadrant   . Back pain   . Calculus of kidney 12/09/2013  . Chronic back pain    unspecified  . Chronic left shoulder pain 07/19/2015  . Functional disorder of bladder    other  . Galactorrhea 11/26/2014   Chronic   . Hereditary and idiopathic neuropathy 08/19/2013  . HPV test positive   . Hypercholesteremia 08/19/2013  . Incomplete bladder emptying   . Microscopic hematuria   . MS (multiple sclerosis) ([place])   . Muscle spasticity 05/21/2014  . Nonspecific findings on examination of urine    other  . Osteopenia   . Status post laparoscopic supracervical hysterectomy 11/26/2014  . Tobacco user 11/26/2014  . Wrist fracture     Past Surgical History:  Procedure Laterality Date  . bilateral tubal ligation  1996  . BREAST CYST EXCISION Left 2002  . KNEE SURGERY     right  . LAPAROSCOPIC SUPRACERVICAL HYSTERECTOMY  08/05/2013  . ORIF WRIST FRACTURE Left 01/17/2017   Procedure: OPEN REDUCTION INTERNAL FIXATION (ORIF) WRIST FRACTURE;  Surgeon: Lovell Sheehan, MD;  Location: [facility] ORS;  Service: Orthopedics;  Laterality: Left;  . TUBAL  LIGATION Bilateral   . VAGINAL HYSTERECTOMY  03/2006    There were no vitals filed for this visit.  Subjective Assessment - 04/09/18 1022    Subjective  Patient reports she had a good thanksgiving. Has been compliant with her HEP. No LOB or falls since last session.     Pertinent History  Pt is a 57 y/o F who presents with BLE weakness and difficulty ambulating due to h/o MS that was diagnosed in 1995. Pt denies any pain associated. Pt was admitted to the hospital in November 2018 due to MS exacerbation. Nov-Dec to [facility] until end of Jan. After rehab the pt was able to ambulate up to 100 ft with the RW. Pt has Bil AFO, her therapist at [facility] thinks she needs a new AFO as her feet still drag when she ambulates, pt reports HHPT mentioned a spring AFO. Pt has custom AFOs that were originally made 4 years ago. Goals: improve balance, to be able to get into the shower with her tub bench without assist, to be able to get up 2 steps at sister's home with railings on both sides but too far apart to reach both sides, to be able to drive adapted car. Pt is staying at other sister's house with one step to get inside and currently picking up L leg  with UEs and then stepping in. Pt steps down leading with LLE. Pt wants to work on walking side to side as this is challenging and she had not yet progressed to this at rehab. Pt able to get in/out the car with assist only to bring BLEs into the car. Hoping to drive by June or July with adapted car (already has one). Pt needs assist bringing LEs into and out of hospital bed. Pt able to perform sit>stand without assist from [facility] with a pillow on the seat for a boost. Pt denies any falls in the past 6 months. Pt has been increasing her time alone at home up to a few hours at a time to become more independent. Pt has both manual and power WC. Uses power WC at home, uses manual WC out in community. Pt able to self propel manual WC. Pt needs assist putting pants  around ankles but she can pull them up. Pt can put on her shoes but needs help getting her leg up to do so. Pt able to tie her shoelaces on her own. Pt has a tub/shower unit at home with a tub bench. No grab bars in the shower. Has hand held shower head. Pt currently taking a sponge bath. Pt has a regular height toilet with a BSC over top. Pt with h/o L wrist fx with no precautions, pt wore her soft brace to evaluation for comfort but reports her wrist has healed and is doing well following OT.     Limitations  Lifting;Standing;Walking;House hold activities    How long can you sit comfortably?  n/a    How long can you stand comfortably?  3 minutes without UE assist, limited by fatigue    How long can you walk comfortably?  100 ft, limited by fatigue    Patient Stated Goals  see above    Currently in Pain?  No/denies       parallel bar walking CGA 6 lengths with one seated rest break, ice pack applied to abdomen following for thermoregulation during rest breaks, x2 lengths second trial   STS x4 with min A and UE assistance using parallel bars; VC's for controlled lowering.   Standing weight shift x10 each side.    Standing toe taps in parallel bars bilateral LE's x10 each; VC's for encouragement and technique.   Standing single leg marches for focus on hip flexion and foot clearance during gait, x10 bilateral LE's with UE support using parallel bars and CGA.  VC's to be mindful of wt shift and to sit and rest when pt became fatigued.   5 mini squats with BUE support in // bars with w/c behind     Seated balloon taps, back away from back of chair for upright positioning reaching inside and outside BOS for trunk stability and challenge. 2 minutes    Seated manually resisted isometric hip flexion, abduction, adduction each LE with 3 sec hold x15/total fatigue.   5000 g weighted ball vertical raises with upright posture, avoiding leaning into back of chair x8 B UE. 2 sets                               PT Education - 04/09/18 1022    Education provided  Yes    Education Details  exercise technique, stability, goals.     Person(s) Educated  Patient    Methods  Explanation;Demonstration;Verbal cues    Comprehension  Verbalized understanding;Returned demonstration       PT Short Term Goals - 03/22/18 1154      PT SHORT TERM GOAL #1   Title  Pt will be completed HEP with min assist from caregiver at least 4 days/wk for improved carryover between sessions    Baseline  HEP compliant     Time  2    Period  Weeks    Status  Achieved      PT SHORT TERM GOAL #2   Title  Patient will report a worst pain of 6/10 on VAS in cervical spine to improve tolerance with ADLs and reduced symptoms with activities.     Baseline  4/30: 9/10 5/21: 0/10 pain    Time  2    Period  Weeks    Status  Achieved      PT SHORT TERM GOAL #3   Title  Patient will perform cervical rotation at least 50 degrees, flexion and extension to 70 degrees, and sidebending to 40 degrees without pain    Baseline  4/30: R 50 L 34 Rotation, R 26 L 35 sb, Extension 35 flex 35 ; 5/21: extension 46, SB R 41 L 40, rotation R 62 L 61,     Time  2    Period  Weeks    Status  Achieved        PT Long Term Goals - 03/22/18 1050      PT LONG TERM GOAL #1   Title  Pt's 5xSTS will impove to equal to or below 2 minutes to demonstrate improved balance and BLE strength    Baseline  3.02 minutes; 5/15: 1 min 30 seconds 9/26:unable to complete fully secondary to patient not feeling well    Time  6    Period  Weeks    Status  Achieved      PT LONG TERM GOAL #2   Title  Pt's 1mT will improve to at least 0.5 m/s for improved safety ambulating in home    Baseline  0.14 m/s; 5/15: .246m  7/10: .36 m/s  8/20: .42 m/s  9/26: 0.265m10/14: .26 m/s improved hip and knee clearance 11/15: 0.6m110mmproved hip and knee clearance    Time  8    Period  Weeks    Status  Partially Met      PT  LONG TERM GOAL #3   Title  Pt's Bil hip F strength will improve to at least 2+/5 BLEs for improved gait mechanics and safety    Baseline  L: 2/5, R: 1/5 7/10: 2/5 bilaterally  8/20: 2/5 bilat 9/26: 2/5 bilat 10/14: 2/5 bilaterally ,improved hip flexor muscle activation 2/5: improved hip flexor and quad activation    Time  8    Period  Weeks    Status  Partially Met      PT LONG TERM GOAL #4   Title  Pt's ABC Scale will improve to at least 40% to demonstrate improved balance    Baseline  28.13% 5/15; 24.4% 7/10: 23.4% 8/20: 22. 2% 9/26: 25.3% 10/14: 26.5% 11/15: 24.38%    Time  6    Period  Weeks    Status  Partially Met      PT LONG TERM GOAL #5   Title  Pt will be able to perform car transfer without assist to work towards pt's goal of returning to driving    Baseline  only needs help with one leg to put onto running board to  get into sisters car; 8/20: in and out of own car independently.     Time  6    Period  Weeks    Status  Achieved      PT LONG TERM GOAL #6   Title  Pt will be able to perform tub bench into/out of shower transfer without assist for improved independence with this task    Baseline  challenged with steps at this time, getting better, stands and gives sponge bath independently now  9/26: has not tried transfer yet 11/15: has not attempted yet    Time  6    Period  Weeks    Status  Partially Met      PT LONG TERM GOAL #7   Title  Patient will perform cervical rotation at least 60 degrees, flexion and extension to 70 degrees, and sidebending to 40 degrees without pain    Baseline  4/30: R 50 L 34 Rotation, R 26 L 35 sb, Extension 35 flex 35; 5/21: extension 46, SB R 41 L 40, rotation R 62 L 61,     Time  4    Period  Weeks    Status  Achieved      PT LONG TERM GOAL #8   Title  Patient will improve NDI to <28% to reduce disability to mild  percieved disability for improved quality of life.     Baseline  4/30: 56% severe disability 5/21: 10%    Time  4     Period  Weeks    Status  Achieved      PT LONG TERM GOAL  #9   TITLE  Patient will report a worst pain of 3/10 on VAS in  cervical spine  to improve tolerance with ADLs and reduced symptoms with activities.     Baseline  4/30: 9/10 5/21: 0/10 in neck     Time  4    Period  Weeks    Status  Achieved      PT LONG TERM GOAL  #10   TITLE  Pt's 5xSTS will impove to equal to or below 50 seconds to demonstrate improved balance and BLE strength    Baseline  5/15: 1 min 30.9 seconds 7/10: 64 seconds  8/20: 1 min 29 seconds with L foot frequently slipping out requiring patient to perform multiple attempts per one stand 10/10: 36 seconds     Time  8    Period  Weeks    Status  Achieved      PT LONG TERM GOAL  #11   TITLE  Patient will descend/ascend down a handicap ramp  with RW and with supervision to increase independence in home environment.     Baseline  7/10 can ascend, unable to descend ; 8/20: achieved on 8/31    Time  6    Period  Weeks    Status  Achieved      PT LONG TERM GOAL  #12   TITLE  Patient will negotiate one step ~4 inches to enter house independently to increase functional independence in natural environment.     Baseline  8/20: unable to do 9/26: patient too fatigued/not feeling well to perform 10/14: unable to perform at this time 11/15: unable to perform at this time    Time  8    Period  Weeks    Status  On-going      PT LONG TERM GOAL  #13   TITLE  Patient will perform STS from  standard height surface to walker to allow patient to participate and transition in public areas when not in wheelchair.     Baseline  10/14: able to perform in W/C which is raised slightly 11/15: 20inch height    Time  8    Period  Weeks    Status  Partially Met    Target Date  04/15/18            Plan - 04/09/18 1041    Clinical Impression Statement  Patient presents with fatigue today requiring intermittent rest breaks. Patient demonstrates improved step length with ambulation  and foot clearance. Patient improving squat depth with UE support demonstrating improved strength. Patient will continue to benefit from skilled physical therapy to improve mobility and overall QOL.    Rehab Potential  Good    PT Frequency  2x / week    PT Duration  8 weeks    PT Treatment/Interventions  ADLs/Self Care Home Management;Aquatic Therapy;Biofeedback;Cryotherapy;Electrical Stimulation;Iontophoresis 56m/ml Dexamethasone;Traction;Moist Heat;Ultrasound;DME Instruction;Gait training;Stair training;Functional mobility training;Therapeutic activities;Therapeutic exercise;Balance training;Neuromuscular re-education;Patient/family education;Orthotic Fit/Training;Wheelchair mobility training;Manual techniques;Compression bandaging;Passive range of motion;Dry needling;Energy conservation;Taping;Splinting    PT Next Visit Plan  Outcome measures, goals, and progress note, continue strengthening/balance    PT Home Exercise Plan  to initiate next session (please incorporate hip flexor and adductor stretches in HEP if possible -KG)    Consulted and Agree with Plan of Care  Patient       Patient will benefit from skilled therapeutic intervention in order to improve the following deficits and impairments:  Abnormal gait, Decreased activity tolerance, Decreased balance, Decreased coordination, Decreased endurance, Decreased knowledge of use of DME, Decreased mobility, Decreased range of motion, Decreased safety awareness, Decreased strength, Difficulty walking, Impaired perceived functional ability, Impaired sensation, Impaired tone, Impaired UE functional use, Improper body mechanics, Postural dysfunction  Visit Diagnosis: Muscle weakness (generalized)  Unsteadiness on feet  Other abnormalities of gait and mobility     Problem List Patient Active Problem List   Diagnosis Date Noted  . Radial styloid tenosynovitis 03/12/2018  . Wheelchair confinement 02/27/2018  . Localized osteoporosis with  current pathological fracture with routine healing 01/19/2017  . Wrist fracture 01/16/2017  . Sprain of ankle 03/23/2016  . Closed fracture of lateral malleolus 03/16/2016  . Health care maintenance 01/24/2016  . Blood pressure elevated without history of HTN 10/25/2015  . Essential hypertension 10/25/2015  . Multiple sclerosis ([place] 10/02/2015  . Chronic left shoulder pain 07/19/2015  . Multiple sclerosis exacerbation ([place] 07/14/2015  . MS (multiple sclerosis) ([place] 11/26/2014  . Increased body mass index 11/26/2014  . HPV test positive 11/26/2014  . Status post laparoscopic supracervical hysterectomy 11/26/2014  . Galactorrhea 11/26/2014  . Back ache 05/21/2014  . Adiposity 05/21/2014  . Disordered sleep 05/21/2014  . Muscle spasticity 05/21/2014  . Spasticity 05/21/2014  . Calculus of kidney 12/09/2013  . Renal colic 072/27/7375 . Hypercholesteremia 08/19/2013  . Hereditary and idiopathic neuropathy 08/19/2013  . Hypercholesterolemia without hypertriglyceridemia 08/19/2013  . Bladder infection, chronic 07/25/2012  . Disorder of bladder function 07/25/2012  . Incomplete bladder emptying 07/25/2012  . Microscopic hematuria 07/25/2012  . Right upper quadrant pain 07/25/2012   MJanna Arch PT, DPT   04/09/2018, 10:59 AM  [facility] [address] [address]Phone: [phone]  Fax:  [phone] Name: Lisa ColasurdoRamseur MRN: [id number] of Birth: 605-14-1962

## 2018-04-11 ENCOUNTER — Ambulatory Visit: Payer: PPO

## 2018-04-11 DIAGNOSIS — M6281 Muscle weakness (generalized): Secondary | ICD-10-CM | POA: Diagnosis not present

## 2018-04-11 DIAGNOSIS — R2681 Unsteadiness on feet: Secondary | ICD-10-CM

## 2018-04-11 DIAGNOSIS — R2689 Other abnormalities of gait and mobility: Secondary | ICD-10-CM

## 2018-04-11 NOTE — Therapy (Signed)
[place] MAIN [facility] SERVICES [address] [place], [place], [address] Phone: [phone]   Fax:  [phone]  Physical Therapy Treatment Physical Therapy Progress Note   Dates of reporting period  03/12/18  to   04/11/18   Patient Details  Name: Lisa Williams MRN: [id number] Date of Birth: 11-20-1960 Referring Provider (PT): Gurney Maxin, MD   Encounter Date: 04/11/2018  PT End of Session - 04/11/18 1204    Visit Number  [id number]    Number of Visits  81    Date for PT Re-Evaluation  06/06/18    Authorization Type  5/10 PN start 11/15 (next 1/10 starting 04/11/18)    PT Start Time  1015    PT Stop Time  1059    PT Time Calculation (min)  44 min    Equipment Utilized During Treatment  Gait belt    Activity Tolerance  Patient limited by fatigue;Patient tolerated treatment well    Behavior During Therapy  [facility] for tasks assessed/performed       Past Medical History:  Diagnosis Date  . Abdominal pain, right upper quadrant   . Back pain   . Calculus of kidney 12/09/2013  . Chronic back pain    unspecified  . Chronic left shoulder pain 07/19/2015  . Functional disorder of bladder    other  . Galactorrhea 11/26/2014   Chronic   . Hereditary and idiopathic neuropathy 08/19/2013  . HPV test positive   . Hypercholesteremia 08/19/2013  . Incomplete bladder emptying   . Microscopic hematuria   . MS (multiple sclerosis) ([place])   . Muscle spasticity 05/21/2014  . Nonspecific findings on examination of urine    other  . Osteopenia   . Status post laparoscopic supracervical hysterectomy 11/26/2014  . Tobacco user 11/26/2014  . Wrist fracture     Past Surgical History:  Procedure Laterality Date  . bilateral tubal ligation  1996  . BREAST CYST EXCISION Left 2002  . KNEE SURGERY     right  . LAPAROSCOPIC SUPRACERVICAL HYSTERECTOMY  08/05/2013  . ORIF WRIST FRACTURE Left 01/17/2017   Procedure: OPEN REDUCTION INTERNAL FIXATION (ORIF) WRIST  FRACTURE;  Surgeon: Lovell Sheehan, MD;  Location: [facility] ORS;  Service: Orthopedics;  Laterality: Left;  . TUBAL LIGATION Bilateral   . VAGINAL HYSTERECTOMY  03/2006    There were no vitals filed for this visit.  Subjective Assessment - 04/11/18 1154    Subjective  Patient sponge bathed and dressed herself independently for the first time yesterday and this morning. Reports no falls or LOB    Pertinent History  Pt is a 57 y/o F who presents with BLE weakness and difficulty ambulating due to h/o MS that was diagnosed in 1995. Pt denies any pain associated. Pt was admitted to the hospital in November 2018 due to MS exacerbation. Nov-Dec to HHPT-->[facility] until end of Jan. After rehab the pt was able to ambulate up to 100 ft with the RW. Pt has Bil AFO, her therapist at [place] thinks she needs a new AFO as her feet still drag when she ambulates, pt reports HHPT mentioned a spring AFO. Pt has custom AFOs that were originally made 4 years ago. Goals: improve balance, to be able to get into the shower with her tub bench without assist, to be able to get up 2 steps at sister's home with railings on both sides but too far apart to reach both sides, to be able to drive adapted  car. Pt is staying at other sister's house with one step to get inside and currently picking up L leg with UEs and then stepping in. Pt steps down leading with LLE. Pt wants to work on walking side to side as this is challenging and she had not yet progressed to this at rehab. Pt able to get in/out the car with assist only to bring BLEs into the car. Hoping to drive by June or July with adapted car (already has one). Pt needs assist bringing LEs into and out of hospital bed. Pt able to perform sit>stand without assist from [facility] with a pillow on the seat for a boost. Pt denies any falls in the past 6 months. Pt has been increasing her time alone at home up to a few hours at a time to become more independent. Pt has both manual and power  WC. Uses power WC at home, uses manual WC out in community. Pt able to self propel manual WC. Pt needs assist putting pants around ankles but she can pull them up. Pt can put on her shoes but needs help getting her leg up to do so. Pt able to tie her shoelaces on her own. Pt has a tub/shower unit at home with a tub bench. No grab bars in the shower. Has hand held shower head. Pt currently taking a sponge bath. Pt has a regular height toilet with a BSC over top. Pt with h/o L wrist fx with no precautions, pt wore her soft brace to evaluation for comfort but reports her wrist has healed and is doing well following OT.     Limitations  Lifting;Standing;Walking;House hold activities    How long can you sit comfortably?  n/a    How long can you stand comfortably?  3 minutes without UE assist, limited by fatigue    How long can you walk comfortably?  100 ft, limited by fatigue    Patient Stated Goals  see above    Currently in Pain?  No/denies       Goals Create new standing tolerance goal:   Standing tolerance: 56 seconds 10 MWT= .29 m/s improved step length and forward momentum with decreased foot drag bilaterally.  ABC : 26% One 4 inch step to enter house: in progress: able to lift 1 inch STS from 17" (standard height) surface to walker.  18 inches from plinth table.    Sit to stand practice from varying height plinth tables focusing on utilization of LE's for propulsion and power.   Seated balloon taps reaching inside and outside BOS 2 minutes.    Patient's condition has the potential to improve in response to therapy. Maximum improvement is yet to be obtained. The anticipated improvement is attainable and reasonable in a generally predictable time.  Patient reports that she is increasing her independence and is able to perform more ADLs such as sponge bathing herself. Is able to get dressed independently now.             PT Education - 04/11/18 1155    Education provided  Yes     Education Details  goal progression, POC,     Person(s) Educated  Patient    Methods  Explanation;Demonstration;Verbal cues    Comprehension  Verbalized understanding;Returned demonstration       PT Short Term Goals - 04/11/18 1156      PT SHORT TERM GOAL #1   Title  Pt will be completed HEP with min assist from caregiver at  least 4 days/wk for improved carryover between sessions    Baseline  HEP compliant     Time  2    Period  Weeks    Status  Achieved      PT SHORT TERM GOAL #2   Title  Patient will report a worst pain of 6/10 on VAS in cervical spine to improve tolerance with ADLs and reduced symptoms with activities.     Baseline  4/30: 9/10 5/21: 0/10 pain    Time  2    Period  Weeks    Status  Achieved      PT SHORT TERM GOAL #3   Title  Patient will perform cervical rotation at least 50 degrees, flexion and extension to 70 degrees, and sidebending to 40 degrees without pain    Baseline  4/30: R 50 L 34 Rotation, R 26 L 35 sb, Extension 35 flex 35 ; 5/21: extension 46, SB R 41 L 40, rotation R 62 L 61,     Time  2    Period  Weeks    Status  Achieved        PT Long Term Goals - 04/11/18 1028      PT LONG TERM GOAL #1   Title  Pt's 5xSTS will impove to equal to or below 2 minutes to demonstrate improved balance and BLE strength    Baseline  3.02 minutes; 5/15: 1 min 30 seconds 9/26:unable to complete fully secondary to patient not feeling well    Time  6    Period  Weeks    Status  Achieved      PT LONG TERM GOAL #2   Title  Pt's 41mT will improve to at least 0.5 m/s for improved safety ambulating in home    Baseline  0.14 m/s; 5/15: .268m  7/10: .36 m/s  8/20: .42 m/s  9/26: 0.2676m10/14: .26 m/s improved hip and knee clearance 11/15: 0.94m34mmproved hip and knee clearance; 12/5: .29 m/s improved step length and forward moment: decreased foot drag.     Time  8    Period  Weeks    Status  Partially Met    Target Date  06/06/18      PT LONG TERM GOAL #3    Title  Pt's Bil hip F strength will improve to at least 2+/5 BLEs for improved gait mechanics and safety    Baseline  L: 2/5, R: 1/5 7/10: 2/5 bilaterally  8/20: 2/5 bilat 9/26: 2/5 bilat 10/14: 2/5 bilaterally ,improved hip flexor muscle activation 2/5: improved hip flexor and quad activation: 12/5: 2/5     Time  8    Period  Weeks    Status  Partially Met    Target Date  06/06/18      PT LONG TERM GOAL #4   Title  Pt's ABC Scale will improve to at least 40% to demonstrate improved balance    Baseline  28.13% 5/15; 24.4% 7/10: 23.4% 8/20: 22. 2% 9/26: 25.3% 10/14: 26.5% 11/15: 24.38% 12/5: 26%    Time  6    Period  Weeks    Status  Partially Met    Target Date  06/06/18      PT LONG TERM GOAL #5   Title  Pt will be able to perform car transfer without assist to work towards pt's goal of returning to driving    Baseline  only needs help with one leg to put onto running board to get into  sisters car; 8/20: in and out of own car independently.     Time  6    Period  Weeks    Status  Achieved      Additional Long Term Goals   Additional Long Term Goals  Yes      PT LONG TERM GOAL #6   Title  Pt will be able to perform tub bench into/out of shower transfer without assist for improved independence with this task    Baseline  challenged with steps at this time, getting better, stands and gives sponge bath independently now  9/26: has not tried transfer yet 11/15: has not attempted yet    Time  6    Period  Weeks    Status  Partially Met      PT LONG TERM GOAL #7   Title  Patient will perform cervical rotation at least 60 degrees, flexion and extension to 70 degrees, and sidebending to 40 degrees without pain    Baseline  4/30: R 50 L 34 Rotation, R 26 L 35 sb, Extension 35 flex 35; 5/21: extension 46, SB R 41 L 40, rotation R 62 L 61,     Time  4    Period  Weeks    Status  Achieved      PT LONG TERM GOAL #8   Title  Patient will improve NDI to <28% to reduce disability to mild   percieved disability for improved quality of life.     Baseline  4/30: 56% severe disability 5/21: 10%    Time  4    Period  Weeks    Status  Achieved      PT LONG TERM GOAL  #9   TITLE  Patient will report a worst pain of 3/10 on VAS in  cervical spine  to improve tolerance with ADLs and reduced symptoms with activities.     Baseline  4/30: 9/10 5/21: 0/10 in neck     Time  4    Period  Weeks    Status  Achieved      PT LONG TERM GOAL  #10   TITLE  Pt's 5xSTS will impove to equal to or below 50 seconds to demonstrate improved balance and BLE strength    Baseline  5/15: 1 min 30.9 seconds 7/10: 64 seconds  8/20: 1 min 29 seconds with L foot frequently slipping out requiring patient to perform multiple attempts per one stand 10/10: 36 seconds     Time  8    Period  Weeks    Status  Achieved      PT LONG TERM GOAL  #11   TITLE  Patient will descend/ascend down a handicap ramp  with RW and with supervision to increase independence in home environment.     Baseline  7/10 can ascend, unable to descend ; 8/20: achieved on 8/31    Time  6    Period  Weeks    Status  Achieved      PT LONG TERM GOAL  #12   TITLE  Patient will negotiate one step ~4 inches to enter house independently to increase functional independence in natural environment.     Baseline  8/20: unable to do 9/26: patient too fatigued/not feeling well to perform 10/14: unable to perform at this time 11/15: unable to perform at this time; 12/5: able to lift 1 inch     Time  8    Period  Weeks    Status  On-going    Target Date  06/06/18      PT LONG TERM GOAL  #13   TITLE  Patient will perform STS from standard height surface to walker to allow patient to participate and transition in public areas when not in wheelchair.     Baseline  10/14: able to perform in W/C which is raised slightly 11/15: 20inch height 12/5: 18 inches from plinth table    Time  8    Period  Weeks    Status  Partially Met    Target Date  06/06/18       PT LONG TERM GOAL  #14   TITLE  Patient will stand 5 minutes to increase standing duration to allow patient to return to washing dishes and clothes.     Baseline  12/5: 56 seconds    Time  8    Period  Weeks    Status  New    Target Date  06/06/18      PT LONG TERM GOAL  #15   TITLE  Patient will negotiate 30 ft of changing surfaces outside such as pavement and parking lot to allow patient to walk into church.     Baseline  12/5: does not walk outside    Time  8    Period  Weeks    Status  New    Target Date  06/06/18            Plan - 04/11/18 1207    Clinical Impression Statement  Patient has demonstrated progress towards goals. Within three weeks she has increased her speed to .17ms with improved step length and forward momentum resulting in decreased foot drag bilaterally: Patient is able to perform transfers from lower seat position improving to be able to perform 18 inch height plinth table to improve ability to independently mobilize self in public and at home when not in wheelchair. Patient's condition has the potential to improve in response to therapy. Maximum improvement is yet to be obtained. The anticipated improvement is attainable and reasonable in a generally predictable time.  Patient reports that she is increasing her independence and is able to perform more ADLs such as sponge bathing herself. Is able to get dressed independently now.  Patient continues to be challenged by functional capacity of mobility. Patient will continue to benefit from skilled physical therapy to improve mobility and overall QOL.    Rehab Potential  Good    PT Frequency  2x / week    PT Duration  8 weeks    PT Treatment/Interventions  ADLs/Self Care Home Management;Aquatic Therapy;Biofeedback;Cryotherapy;Electrical Stimulation;Iontophoresis 450mml Dexamethasone;Traction;Moist Heat;Ultrasound;DME Instruction;Gait training;Stair training;Functional mobility training;Therapeutic  activities;Therapeutic exercise;Balance training;Neuromuscular re-education;Patient/family education;Orthotic Fit/Training;Wheelchair mobility training;Manual techniques;Compression bandaging;Passive range of motion;Dry needling;Energy conservation;Taping;Splinting    PT Next Visit Plan  Outcome measures, goals, and progress note, continue strengthening/balance    PT Home Exercise Plan  to initiate next session (please incorporate hip flexor and adductor stretches in HEP if possible -KG)    Consulted and Agree with Plan of Care  Patient       Patient will benefit from skilled therapeutic intervention in order to improve the following deficits and impairments:  Abnormal gait, Decreased activity tolerance, Decreased balance, Decreased coordination, Decreased endurance, Decreased knowledge of use of DME, Decreased mobility, Decreased range of motion, Decreased safety awareness, Decreased strength, Difficulty walking, Impaired perceived functional ability, Impaired sensation, Impaired tone, Impaired UE functional use, Improper body mechanics, Postural dysfunction  Visit Diagnosis: Muscle weakness (generalized)  Unsteadiness on feet  Other abnormalities of gait and mobility     Problem List Patient Active Problem List   Diagnosis Date Noted  . Radial styloid tenosynovitis 03/12/2018  . Wheelchair confinement 02/27/2018  . Localized osteoporosis with current pathological fracture with routine healing 01/19/2017  . Wrist fracture 01/16/2017  . Sprain of ankle 03/23/2016  . Closed fracture of lateral malleolus 03/16/2016  . Health care maintenance 01/24/2016  . Blood pressure elevated without history of HTN 10/25/2015  . Essential hypertension 10/25/2015  . Multiple sclerosis ([place]) 10/02/2015  . Chronic left shoulder pain 07/19/2015  . Multiple sclerosis exacerbation ([place]) 07/14/2015  . MS (multiple sclerosis) ([place]) 11/26/2014  . Increased body mass index 11/26/2014  . HPV test positive  11/26/2014  . Status post laparoscopic supracervical hysterectomy 11/26/2014  . Galactorrhea 11/26/2014  . Back ache 05/21/2014  . Adiposity 05/21/2014  . Disordered sleep 05/21/2014  . Muscle spasticity 05/21/2014  . Spasticity 05/21/2014  . Calculus of kidney 12/09/2013  . Renal colic 65/53/7482  . Hypercholesteremia 08/19/2013  . Hereditary and idiopathic neuropathy 08/19/2013  . Hypercholesterolemia without hypertriglyceridemia 08/19/2013  . Bladder infection, chronic 07/25/2012  . Disorder of bladder function 07/25/2012  . Incomplete bladder emptying 07/25/2012  . Microscopic hematuria 07/25/2012  . Right upper quadrant pain 07/25/2012   Janna Arch, PT, DPT   04/11/2018, 12:07 PM  [place] MAIN [facility] SERVICES [address] [place], [place], [address] Phone: [phone]   Fax:  [phone]  Name: Lisa Williams MRN: [id number] Date of Birth: Jan 23, 1961

## 2018-04-12 ENCOUNTER — Ambulatory Visit
Admission: RE | Admit: 2018-04-12 | Discharge: 2018-04-12 | Disposition: A | Discharge status: Home / Self Care | Payer: PPO | Source: Ambulatory Visit | Attending: Internal Medicine | Admitting: Internal Medicine

## 2018-04-12 DIAGNOSIS — Z1231 Encounter for screening mammogram for malignant neoplasm of breast: Secondary | ICD-10-CM | POA: Insufficient documentation

## 2018-04-15 ENCOUNTER — Ambulatory Visit: Payer: PPO

## 2018-04-15 ENCOUNTER — Inpatient Hospital Stay
Admission: EM | Admit: 2018-04-15 | Discharge: 2018-04-19 | DRG: 580 | Disposition: A | Discharge status: Home Health | Payer: PPO | Attending: Internal Medicine | Admitting: Internal Medicine

## 2018-04-15 ENCOUNTER — Other Ambulatory Visit: Payer: Self-pay

## 2018-04-15 DIAGNOSIS — M6281 Muscle weakness (generalized): Secondary | ICD-10-CM

## 2018-04-15 DIAGNOSIS — N764 Abscess of vulva: Secondary | ICD-10-CM | POA: Diagnosis present

## 2018-04-15 DIAGNOSIS — Z7401 Bed confinement status: Secondary | ICD-10-CM | POA: Diagnosis not present

## 2018-04-15 DIAGNOSIS — R2681 Unsteadiness on feet: Secondary | ICD-10-CM

## 2018-04-15 DIAGNOSIS — L02215 Cutaneous abscess of perineum: Secondary | ICD-10-CM | POA: Diagnosis present

## 2018-04-15 DIAGNOSIS — Z6834 Body mass index (BMI) 34.0-34.9, adult: Secondary | ICD-10-CM

## 2018-04-15 DIAGNOSIS — L02214 Cutaneous abscess of groin: Principal | ICD-10-CM | POA: Diagnosis present

## 2018-04-15 DIAGNOSIS — E860 Dehydration: Secondary | ICD-10-CM | POA: Diagnosis present

## 2018-04-15 DIAGNOSIS — I10 Essential (primary) hypertension: Secondary | ICD-10-CM | POA: Diagnosis present

## 2018-04-15 DIAGNOSIS — R110 Nausea: Secondary | ICD-10-CM | POA: Diagnosis present

## 2018-04-15 DIAGNOSIS — G35 Multiple sclerosis: Secondary | ICD-10-CM | POA: Diagnosis present

## 2018-04-15 DIAGNOSIS — N739 Female pelvic inflammatory disease, unspecified: Secondary | ICD-10-CM

## 2018-04-15 DIAGNOSIS — R000 Tachycardia, unspecified: Secondary | ICD-10-CM | POA: Diagnosis not present

## 2018-04-15 DIAGNOSIS — D72829 Elevated white blood cell count, unspecified: Secondary | ICD-10-CM | POA: Diagnosis present

## 2018-04-15 DIAGNOSIS — I471 Supraventricular tachycardia, unspecified: Secondary | ICD-10-CM

## 2018-04-15 DIAGNOSIS — W19XXXA Unspecified fall, initial encounter: Secondary | ICD-10-CM | POA: Diagnosis not present

## 2018-04-15 DIAGNOSIS — R531 Weakness: Secondary | ICD-10-CM | POA: Diagnosis not present

## 2018-04-15 DIAGNOSIS — R739 Hyperglycemia, unspecified: Secondary | ICD-10-CM | POA: Diagnosis present

## 2018-04-15 DIAGNOSIS — R29898 Other symptoms and signs involving the musculoskeletal system: Secondary | ICD-10-CM | POA: Diagnosis not present

## 2018-04-15 DIAGNOSIS — Z90711 Acquired absence of uterus with remaining cervical stump: Secondary | ICD-10-CM | POA: Diagnosis not present

## 2018-04-15 DIAGNOSIS — E669 Obesity, unspecified: Secondary | ICD-10-CM | POA: Diagnosis present

## 2018-04-15 DIAGNOSIS — N319 Neuromuscular dysfunction of bladder, unspecified: Secondary | ICD-10-CM | POA: Diagnosis present

## 2018-04-15 DIAGNOSIS — E785 Hyperlipidemia, unspecified: Secondary | ICD-10-CM | POA: Diagnosis present

## 2018-04-15 DIAGNOSIS — M2550 Pain in unspecified joint: Secondary | ICD-10-CM | POA: Diagnosis not present

## 2018-04-15 DIAGNOSIS — M816 Localized osteoporosis [Lequesne]: Secondary | ICD-10-CM | POA: Diagnosis present

## 2018-04-15 DIAGNOSIS — G839 Paralytic syndrome, unspecified: Secondary | ICD-10-CM | POA: Diagnosis not present

## 2018-04-15 DIAGNOSIS — E7800 Pure hypercholesterolemia, unspecified: Secondary | ICD-10-CM | POA: Diagnosis present

## 2018-04-15 DIAGNOSIS — R2689 Other abnormalities of gait and mobility: Secondary | ICD-10-CM

## 2018-04-15 DIAGNOSIS — Z87891 Personal history of nicotine dependence: Secondary | ICD-10-CM | POA: Diagnosis not present

## 2018-04-15 DIAGNOSIS — M48061 Spinal stenosis, lumbar region without neurogenic claudication: Secondary | ICD-10-CM | POA: Diagnosis not present

## 2018-04-15 DIAGNOSIS — W1830XA Fall on same level, unspecified, initial encounter: Secondary | ICD-10-CM | POA: Diagnosis not present

## 2018-04-15 LAB — COMPREHENSIVE METABOLIC PANEL
ALT: 16 U/L (ref 0–44)
AST: 16 U/L (ref 15–41)
Albumin: 4 g/dL (ref 3.5–5.0)
Alkaline Phosphatase: 122 U/L (ref 38–126)
Anion gap: 4 — ABNORMAL LOW (ref 5–15)
BUN: 22 mg/dL — AB (ref 6–20)
CO2: 26 mmol/L (ref 22–32)
Calcium: 9 mg/dL (ref 8.9–10.3)
Chloride: 108 mmol/L (ref 98–111)
Creatinine, Ser: 0.73 mg/dL (ref 0.44–1.00)
GFR calc Af Amer: >60 mL/min (ref >60)
GFR calc non Af Amer: >60 mL/min (ref >60)
GLUCOSE: 114 mg/dL — AB (ref 70–99)
Potassium: 4.2 mmol/L (ref 3.5–5.1)
Sodium: 138 mmol/L (ref 135–145)
TOTAL PROTEIN: 7.4 g/dL (ref 6.5–8.1)
Total Bilirubin: 0.5 mg/dL (ref 0.3–1.2)

## 2018-04-15 LAB — CBC WITH DIFFERENTIAL/PLATELET
Abs Immature Granulocytes: 0.04 10*3/uL (ref 0.00–0.07)
Basophils Absolute: 0.1 10*3/uL (ref 0.0–0.1)
Basophils Relative: 1 %
Eosinophils Absolute: 0.1 10*3/uL (ref 0.0–0.5)
Eosinophils Relative: 1 %
HCT: 38.7 % (ref 36.0–46.0)
Hemoglobin: 13.4 g/dL (ref 12.0–15.0)
Immature Granulocytes: 0 %
LYMPHS ABS: 1.5 10*3/uL (ref 0.7–4.0)
LYMPHS PCT: 14 %
MCH: 26.4 pg (ref 26.0–34.0)
MCHC: 34.6 g/dL (ref 30.0–36.0)
MCV: 76.2 fL — ABNORMAL LOW (ref 80.0–100.0)
Monocytes Absolute: 1 10*3/uL (ref 0.1–1.0)
Monocytes Relative: 9 %
Neutro Abs: 8.3 10*3/uL — ABNORMAL HIGH (ref 1.7–7.7)
Neutrophils Relative %: 75 %
Platelets: 247 10*3/uL (ref 150–400)
RBC: 5.08 MIL/uL (ref 3.87–5.11)
RDW: 13.9 % (ref 11.5–15.5)
Smear Review: NORMAL
WBC: 11 10*3/uL — ABNORMAL HIGH (ref 4.0–10.5)
nRBC: 0 % (ref 0.0–0.2)

## 2018-04-15 LAB — URINALYSIS, COMPLETE (UACMP) WITH MICROSCOPIC
Bacteria, UA: NONE SEEN
Bilirubin Urine: NEGATIVE
Glucose, UA: NEGATIVE mg/dL
Hgb urine dipstick: NEGATIVE
Ketones, ur: NEGATIVE mg/dL
Nitrite: NEGATIVE
Protein, ur: NEGATIVE mg/dL
Specific Gravity, Urine: 1.026 (ref 1.005–1.030)
pH: 5 (ref 5.0–8.0)

## 2018-04-15 MED ORDER — LIDOCAINE HCL (PF) 1 % IJ SOLN
5.0000 mL | Freq: Once | INTRAMUSCULAR | Status: AC
  Administered 2018-04-15: 5 mL via INTRADERMAL
  Filled 2018-04-15: qty 5

## 2018-04-15 MED ORDER — SODIUM CHLORIDE 0.9 % IV SOLN
1000.0000 mg | Freq: Once | INTRAVENOUS | Status: AC
  Administered 2018-04-15: 1000 mg via INTRAVENOUS
  Filled 2018-04-15: qty 8

## 2018-04-15 MED ORDER — PENTAFLUOROPROP-TETRAFLUOROETH EX AERO
INHALATION_SPRAY | CUTANEOUS | Status: AC
  Administered 2018-04-15: 23:00:00
  Filled 2018-04-15: qty 30

## 2018-04-15 NOTE — ED Provider Notes (Signed)
[facility] Emergency Department Provider Note  ____________________________________________   None    (approximate)  I have reviewed the triage vital signs and the nursing notes.   HISTORY  Chief Complaint No chief complaint on file.   HPI Lisa Williams is a 57 y.o. female who presents to the emergency department for treatment and evaluation of increased weakness in the lower extremities in the setting of MS. Patient states that she had gone to physical therapy today as usual and had sat down on the couch for a bit. When she got up, her lower extremities were weak and she was unable to stand. She reports this is typical of MS flare. She has been doing well for the past year while on Rituximab. Her last episode similar to this was about a year ago. She denies any new weakness or symptoms of concern with the exception of a boil to the pelvic area. No drainage, but the area is increasing in tenderness.   Past Medical History:  Diagnosis Date  . Abdominal pain, right upper quadrant   . Back pain   . Calculus of kidney 12/09/2013  . Chronic back pain    unspecified  . Chronic left shoulder pain 07/19/2015  . Functional disorder of bladder    other  . Galactorrhea 11/26/2014   Chronic   . Hereditary and idiopathic neuropathy 08/19/2013  . HPV test positive   . Hypercholesteremia 08/19/2013  . Incomplete bladder emptying   . Microscopic hematuria   . MS (multiple sclerosis) (HCC)   . Muscle spasticity 05/21/2014  . Nonspecific findings on examination of urine    other  . Osteopenia   . Status post laparoscopic supracervical hysterectomy 11/26/2014  . Tobacco user 11/26/2014  . Wrist fracture     Patient Active Problem List   Diagnosis Date Noted  . Radial styloid tenosynovitis 03/12/2018  . Wheelchair confinement 02/27/2018  . Localized osteoporosis with current pathological fracture with routine healing 01/19/2017  . Wrist fracture 01/16/2017   . Sprain of ankle 03/23/2016  . Closed fracture of lateral malleolus 03/16/2016  . Health care maintenance 01/24/2016  . Blood pressure elevated without history of HTN 10/25/2015  . Essential hypertension 10/25/2015  . Multiple sclerosis (HCC) 10/02/2015  . Chronic left shoulder pain 07/19/2015  . Multiple sclerosis exacerbation (HCC) 07/14/2015  . MS (multiple sclerosis) (HCC) 11/26/2014  . Increased body mass index 11/26/2014  . HPV test positive 11/26/2014  . Status post laparoscopic supracervical hysterectomy 11/26/2014  . Galactorrhea 11/26/2014  . Back ache 05/21/2014  . Adiposity 05/21/2014  . Disordered sleep 05/21/2014  . Muscle spasticity 05/21/2014  . Spasticity 05/21/2014  . Calculus of kidney 12/09/2013  . Renal colic 12/09/2013  . Hypercholesteremia 08/19/2013  . Hereditary and idiopathic neuropathy 08/19/2013  . Hypercholesterolemia without hypertriglyceridemia 08/19/2013  . Bladder infection, chronic 07/25/2012  . Disorder of bladder function 07/25/2012  . Incomplete bladder emptying 07/25/2012  . Microscopic hematuria 07/25/2012  . Right upper quadrant pain 07/25/2012    Past Surgical History:  Procedure Laterality Date  . bilateral tubal ligation  1996  . BREAST CYST EXCISION Left 2002  . KNEE SURGERY     right  . LAPAROSCOPIC SUPRACERVICAL HYSTERECTOMY  08/05/2013  . ORIF WRIST FRACTURE Left 01/17/2017   Procedure: OPEN REDUCTION INTERNAL FIXATION (ORIF) WRIST FRACTURE;  Surgeon: Lyndle Herrlich, MD;  Location: [facility] ORS;  Service: Orthopedics;  Laterality: Left;  . TUBAL LIGATION Bilateral   . VAGINAL HYSTERECTOMY  03/2006  Prior to Admission medications   Medication Sig Start Date End Date Taking? Authorizing Provider  alendronate (FOSAMAX) 70 MG tablet Take by mouth. 02/27/18 02/27/19  [provider]  baclofen (LIORESAL) 20 MG tablet Take 20 mg by mouth every 8 (eight) hours as needed for muscle spasms.     [provider]    Calcium Carb-Cholecalciferol (CALCIUM 600+D) 600-800 MG-UNIT TABS Take 1 tablet by mouth daily.    [provider]  Multiple Vitamins-Minerals (MULTIVITAMIN WITH MINERALS) tablet Take by mouth.    [provider]  oxybutynin (DITROPAN-XL) 10 MG 24 hr tablet Take 1 tablet (10 mg total) by mouth daily. 03/12/18   Vanna Scotland, MD  oxybutynin (DITROPAN-XL) 5 MG 24 hr tablet  02/27/18   [provider]  RITUXIMAB IV Pt uses every 6 months due October 2018 09/06/16   [provider]  telmisartan (MICARDIS) 40 MG tablet Take 40 mg by mouth daily. 10/05/17   [provider]    Allergies Amlodipine and Povidone iodine  Family History  Problem Relation Age of Onset  . Sickle cell trait Sister   . Hypertension Sister   . Supraventricular tachycardia Sister   . Hypertension Sister   . Diabetes Maternal Grandmother   . Liver cancer Maternal Grandmother   . Diabetes Maternal Aunt   . Breast cancer Maternal Aunt        great MAT  . Ovarian cancer Paternal Grandmother   . Nephrolithiasis Father   . Hypertension Father   . Prostate cancer Father   . Kidney disease Father   . Heart failure Father   . Hypertension Sister   . Osteoarthritis Mother   . Hypertension Mother   . Hyperlipidemia Mother   . GU problems Neg Hx   . Urolithiasis Neg Hx     Social History Social History   Tobacco Use  . Smoking status: Former Smoker    Packs/day: 0.50    Types: Cigarettes  . Smokeless tobacco: Never Used  Substance Use Topics  . Alcohol use: No    Alcohol/week: 0.0 standard drinks  . Drug use: No    Review of Systems  Constitutional: No fever/chills Eyes: No visual changes. ENT: No sore throat. Cardiovascular: Denies chest pain. Respiratory: Denies shortness of breath. Gastrointestinal: No abdominal pain.  No nausea, no vomiting.  No diarrhea.  No constipation. Genitourinary: Negative for dysuria. Musculoskeletal: Negative for back  pain. Skin: Negative for rash. Neurological: Negative for headaches, lower extremity weakness. ____________________________________________   PHYSICAL EXAM:  VITAL SIGNS: ED Triage Vitals  Enc Vitals Group     BP 04/15/18 1925 (!) 155/86     Pulse Rate 04/15/18 1925 90     Resp 04/15/18 1925 16     Temp 04/15/18 1925 98.7 F (37.1 C)     Temp Source 04/15/18 1925 Oral     SpO2 04/15/18 1925 97 %     Weight 04/15/18 1926 245 lb (111.1 kg)     Height 04/15/18 1926 5\' 11"  (1.803 m)     Head Circumference --      Peak Flow --      Pain Score 04/15/18 1926 0     Pain Loc --      Pain Edu? --      Excl. in GC? --     Constitutional: Alert and oriented. Well appearing and in no acute distress. Eyes: Conjunctivae are normal. Head: Atraumatic. Nose: No congestion/rhinnorhea. Mouth/Throat: Mucous membranes are moist.  Oropharynx non-erythematous.  Neck: No stridor.   Cardiovascular: Normal rate, regular rhythm. Grossly normal heart sounds.  Good peripheral circulation. Respiratory: Normal respiratory effort.  No retractions. Lungs CTAB. Gastrointestinal: Soft and nontender. No distention. No abdominal bruits. No CVA tenderness. Musculoskeletal: Weakness to bilateral lower extremities on exam. Flexion is stronger than extension and right foot is weaker than the left. No weakness in the upper extremities. Neurologic:  Normal speech and language. See above. Skin: Small furuncle in the left pelvic area with fluctuant center. No bleeding or drainage. Psychiatric: Mood and affect are normal. Speech and behavior are normal.  ____________________________________________   LABS (all labs ordered are listed, but only abnormal results are displayed)  Labs Reviewed  CBC WITH DIFFERENTIAL/PLATELET - Abnormal; Notable for the following components:      Result Value   WBC 11.0 (*)    MCV 76.2 (*)    Neutro Abs 8.3 (*)    All other components within normal limits  COMPREHENSIVE METABOLIC  PANEL - Abnormal; Notable for the following components:   Glucose, Bld 114 (*)    BUN 22 (*)    Anion gap 4 (*)    All other components within normal limits  URINALYSIS, COMPLETE (UACMP) WITH MICROSCOPIC - Abnormal; Notable for the following components:   Color, Urine YELLOW (*)    APPearance CLEAR (*)    Leukocytes, UA TRACE (*)    All other components within normal limits   ____________________________________________  EKG  Not indicated. ____________________________________________  RADIOLOGY  ED MD interpretation:  Not indicated.  Official radiology report(s): No results found.  ____________________________________________   PROCEDURES  Procedure(s) performed: None  .Marland KitchenIncision and Drainage Date/Time: 04/16/2018 12:13 AM Performed by: Chinita Pester, FNP Authorized by: Chinita Pester, FNP   Consent:    Consent obtained:  Verbal   Consent given by:  Patient   Risks discussed:  Incomplete drainage Location:    Type:  Abscess   Location: mons pubis. Pre-procedure details:    Skin preparation:  Antiseptic wash Procedure type:    Complexity:  Complex Procedure details:    Incision types:  Single straight   Incision depth:  Dermal   Scalpel blade:  11   Wound management:  Probed and deloculated   Drainage:  Bloody and purulent   Drainage amount:  Copious   Wound treatment:  Drain placed   Packing materials:  1/4 in iodoform gauze    Critical Care performed: No  ____________________________________________   INITIAL IMPRESSION / ASSESSMENT AND PLAN / ED COURSE  As part of my medical decision making, I reviewed the following data within the electronic MEDICAL RECORD NUMBER Notes from prior ED visits   57 year old female presenting to the emergency department for treatment and evaluation of bilateral lower extremity weakness that is consistent with her typical MS flare.  Review of her ED visit from about this time last year shows a very similar  presentation for which she received 1 g of Solu-Medrol and was admitted for a few days.  According to the discharge summary, the patient responded well to this treatment.  Plan will be to admit the patient for IV Solu-Medrol.  I&D will also be performed.   Incision and drainage completed as above with result of copious amount of purulent drainage.  ____________________________________________   FINAL CLINICAL IMPRESSION(S) / ED DIAGNOSES  Final diagnoses:  Multiple sclerosis exacerbation (HCC)  Abscess of female pelvis     ED Discharge Orders    None  Note:  This document was prepared using Dragon voice recognition software and may include unintentional dictation errors.    Chinita Pester, FNP 04/16/18 0017    Dionne Bucy, MD 04/16/18 0100

## 2018-04-15 NOTE — ED Triage Notes (Signed)
Pt arrived from home via EMS with complaints of increased weakness today in lower extremities due to multiple sclerosis. Pt is alert and oriented x 4. Pt denies any pain at this time. EMS reported that VS WNL.

## 2018-04-15 NOTE — Therapy (Signed)
[place] MAIN [facility] SERVICES [address] [place], [place], [address] Phone: [phone]   Fax:  [phone]  Physical Therapy Treatment  Patient Details  Name: Lisa Williams MRN: [id number] Date of Birth: November 18, 1960 Referring Provider (PT): Gurney Maxin, MD   Encounter Date: 04/15/2018  PT End of Session - 04/15/18 1158    Visit Number  7    Number of Visits  4    Date for PT Re-Evaluation  06/06/18    Authorization Type   1/10 starting 04/11/18    PT Start Time  1015    PT Stop Time  1059    PT Time Calculation (min)  44 min    Equipment Utilized During Treatment  Gait belt    Activity Tolerance  Patient limited by fatigue;Patient tolerated treatment well    Behavior During Therapy  [facility] for tasks assessed/performed       Past Medical History:  Diagnosis Date  . Abdominal pain, right upper quadrant   . Back pain   . Calculus of kidney 12/09/2013  . Chronic back pain    unspecified  . Chronic left shoulder pain 07/19/2015  . Functional disorder of bladder    other  . Galactorrhea 11/26/2014   Chronic   . Hereditary and idiopathic neuropathy 08/19/2013  . HPV test positive   . Hypercholesteremia 08/19/2013  . Incomplete bladder emptying   . Microscopic hematuria   . MS (multiple sclerosis) ([place])   . Muscle spasticity 05/21/2014  . Nonspecific findings on examination of urine    other  . Osteopenia   . Status post laparoscopic supracervical hysterectomy 11/26/2014  . Tobacco user 11/26/2014  . Wrist fracture     Past Surgical History:  Procedure Laterality Date  . bilateral tubal ligation  1996  . BREAST CYST EXCISION Left 2002  . KNEE SURGERY     right  . LAPAROSCOPIC SUPRACERVICAL HYSTERECTOMY  08/05/2013  . ORIF WRIST FRACTURE Left 01/17/2017   Procedure: OPEN REDUCTION INTERNAL FIXATION (ORIF) WRIST FRACTURE;  Surgeon: Lovell Sheehan, MD;  Location: [facility] ORS;  Service: Orthopedics;  Laterality: Left;  .  TUBAL LIGATION Bilateral   . VAGINAL HYSTERECTOMY  03/2006    There were no vitals filed for this visit.  Subjective Assessment - 04/15/18 1040    Subjective  Patient reports having a fall when self bathing on Saturday when right leg gave out. Was not injured. Has been compliant with HEP and not fearful of continued motion.     Pertinent History  Pt is a 57 y/o F who presents with BLE weakness and difficulty ambulating due to h/o MS that was diagnosed in 1995. Pt denies any pain associated. Pt was admitted to the hospital in November 2018 due to MS exacerbation. Nov-Dec to HHPT-->[facility] until end of Jan. After rehab the pt was able to ambulate up to 100 ft with the RW. Pt has Bil AFO, her therapist at [facility] thinks she needs a new AFO as her feet still drag when she ambulates, pt reports HHPT mentioned a spring AFO. Pt has custom AFOs that were originally made 4 years ago. Goals: improve balance, to be able to get into the shower with her tub bench without assist, to be able to get up 2 steps at sister's home with railings on both sides but too far apart to reach both sides, to be able to drive adapted car. Pt is staying at other sister's house with one step to  get inside and currently picking up L leg with UEs and then stepping in. Pt steps down leading with LLE. Pt wants to work on walking side to side as this is challenging and she had not yet progressed to this at rehab. Pt able to get in/out the car with assist only to bring BLEs into the car. Hoping to drive by June or July with adapted car (already has one). Pt needs assist bringing LEs into and out of hospital bed. Pt able to perform sit>stand without assist from [facility] with a pillow on the seat for a boost. Pt denies any falls in the past 6 months. Pt has been increasing her time alone at home up to a few hours at a time to become more independent. Pt has both manual and power WC. Uses power WC at home, uses manual WC out in community. Pt  able to self propel manual WC. Pt needs assist putting pants around ankles but she can pull them up. Pt can put on her shoes but needs help getting her leg up to do so. Pt able to tie her shoelaces on her own. Pt has a tub/shower unit at home with a tub bench. No grab bars in the shower. Has hand held shower head. Pt currently taking a sponge bath. Pt has a regular height toilet with a BSC over top. Pt with h/o L wrist fx with no precautions, pt wore her soft brace to evaluation for comfort but reports her wrist has healed and is doing well following OT.     Limitations  Lifting;Standing;Walking;House hold activities    How long can you sit comfortably?  n/a    How long can you stand comfortably?  3 minutes without UE assist, limited by fatigue    How long can you walk comfortably?  100 ft, limited by fatigue    Patient Stated Goals  see above    Currently in Pain?  No/denies       Ambulate in hallway with CGA and w/c follow, 86 ft total. One seated rest break with ice. Focus/cueing for lateral shift and lift of LE to promote forward propulsion.   Sit to stand from plinth table; focus on pushing knees outwards to promote LE recruitment and propulsion for power of transfer. 2 sets of 5    Seated balloon taps reaching inside and outside BOS 2 minutes.   isometric seated hip flexion 10x each LE        PT Education - 04/15/18 1042    Education provided  Yes    Education Details  exercise technique, stability, sit to stand transfers, ambulation     Person(s) Educated  Patient    Methods  Explanation;Demonstration;Verbal cues    Comprehension  Verbalized understanding;Returned demonstration       PT Short Term Goals - 04/11/18 1156      PT SHORT TERM GOAL #1   Title  Pt will be completed HEP with min assist from caregiver at least 4 days/wk for improved carryover between sessions    Baseline  HEP compliant     Time  2    Period  Weeks    Status  Achieved      PT SHORT TERM GOAL #2    Title  Patient will report a worst pain of 6/10 on VAS in cervical spine to improve tolerance with ADLs and reduced symptoms with activities.     Baseline  4/30: 9/10 5/21: 0/10 pain    Time  2    Period  Weeks    Status  Achieved      PT SHORT TERM GOAL #3   Title  Patient will perform cervical rotation at least 50 degrees, flexion and extension to 70 degrees, and sidebending to 40 degrees without pain    Baseline  4/30: R 50 L 34 Rotation, R 26 L 35 sb, Extension 35 flex 35 ; 5/21: extension 46, SB R 41 L 40, rotation R 62 L 61,     Time  2    Period  Weeks    Status  Achieved        PT Long Term Goals - 04/11/18 1028      PT LONG TERM GOAL #1   Title  Pt's 5xSTS will impove to equal to or below 2 minutes to demonstrate improved balance and BLE strength    Baseline  3.02 minutes; 5/15: 1 min 30 seconds 9/26:unable to complete fully secondary to patient not feeling well    Time  6    Period  Weeks    Status  Achieved      PT LONG TERM GOAL #2   Title  Pt's 19mT will improve to at least 0.5 m/s for improved safety ambulating in home    Baseline  0.14 m/s; 5/15: .223m  7/10: .36 m/s  8/20: .42 m/s  9/26: 0.2649m10/14: .26 m/s improved hip and knee clearance 11/15: 0.69m48mmproved hip and knee clearance; 12/5: .29 m/s improved step length and forward moment: decreased foot drag.     Time  8    Period  Weeks    Status  Partially Met    Target Date  06/06/18      PT LONG TERM GOAL #3   Title  Pt's Bil hip F strength will improve to at least 2+/5 BLEs for improved gait mechanics and safety    Baseline  L: 2/5, R: 1/5 7/10: 2/5 bilaterally  8/20: 2/5 bilat 9/26: 2/5 bilat 10/14: 2/5 bilaterally ,improved hip flexor muscle activation 2/5: improved hip flexor and quad activation: 12/5: 2/5     Time  8    Period  Weeks    Status  Partially Met    Target Date  06/06/18      PT LONG TERM GOAL #4   Title  Pt's ABC Scale will improve to at least 40% to demonstrate improved balance     Baseline  28.13% 5/15; 24.4% 7/10: 23.4% 8/20: 22. 2% 9/26: 25.3% 10/14: 26.5% 11/15: 24.38% 12/5: 26%    Time  6    Period  Weeks    Status  Partially Met    Target Date  06/06/18      PT LONG TERM GOAL #5   Title  Pt will be able to perform car transfer without assist to work towards pt's goal of returning to driving    Baseline  only needs help with one leg to put onto running board to get into sisters car; 8/20: in and out of own car independently.     Time  6    Period  Weeks    Status  Achieved      Additional Long Term Goals   Additional Long Term Goals  Yes      PT LONG TERM GOAL #6   Title  Pt will be able to perform tub bench into/out of shower transfer without assist for improved independence with this task    Baseline  challenged with  steps at this time, getting better, stands and gives sponge bath independently now  9/26: has not tried transfer yet 11/15: has not attempted yet    Time  6    Period  Weeks    Status  Partially Met      PT LONG TERM GOAL #7   Title  Patient will perform cervical rotation at least 60 degrees, flexion and extension to 70 degrees, and sidebending to 40 degrees without pain    Baseline  4/30: R 50 L 34 Rotation, R 26 L 35 sb, Extension 35 flex 35; 5/21: extension 46, SB R 41 L 40, rotation R 62 L 61,     Time  4    Period  Weeks    Status  Achieved      PT LONG TERM GOAL #8   Title  Patient will improve NDI to <28% to reduce disability to mild  percieved disability for improved quality of life.     Baseline  4/30: 56% severe disability 5/21: 10%    Time  4    Period  Weeks    Status  Achieved      PT LONG TERM GOAL  #9   TITLE  Patient will report a worst pain of 3/10 on VAS in  cervical spine  to improve tolerance with ADLs and reduced symptoms with activities.     Baseline  4/30: 9/10 5/21: 0/10 in neck     Time  4    Period  Weeks    Status  Achieved      PT LONG TERM GOAL  #10   TITLE  Pt's 5xSTS will impove to equal to or  below 50 seconds to demonstrate improved balance and BLE strength    Baseline  5/15: 1 min 30.9 seconds 7/10: 64 seconds  8/20: 1 min 29 seconds with L foot frequently slipping out requiring patient to perform multiple attempts per one stand 10/10: 36 seconds     Time  8    Period  Weeks    Status  Achieved      PT LONG TERM GOAL  #11   TITLE  Patient will descend/ascend down a handicap ramp  with RW and with supervision to increase independence in home environment.     Baseline  7/10 can ascend, unable to descend ; 8/20: achieved on 8/31    Time  6    Period  Weeks    Status  Achieved      PT LONG TERM GOAL  #12   TITLE  Patient will negotiate one step ~4 inches to enter house independently to increase functional independence in natural environment.     Baseline  8/20: unable to do 9/26: patient too fatigued/not feeling well to perform 10/14: unable to perform at this time 11/15: unable to perform at this time; 12/5: able to lift 1 inch     Time  8    Period  Weeks    Status  On-going    Target Date  06/06/18      PT LONG TERM GOAL  #13   TITLE  Patient will perform STS from standard height surface to walker to allow patient to participate and transition in public areas when not in wheelchair.     Baseline  10/14: able to perform in W/C which is raised slightly 11/15: 20inch height 12/5: 18 inches from plinth table    Time  8    Period  Weeks  Status  Partially Met    Target Date  06/06/18      PT LONG TERM GOAL  #14   TITLE  Patient will stand 5 minutes to increase standing duration to allow patient to return to washing dishes and clothes.     Baseline  12/5: 56 seconds    Time  8    Period  Weeks    Status  New    Target Date  06/06/18      PT LONG TERM GOAL  #15   TITLE  Patient will negotiate 30 ft of changing surfaces outside such as pavement and parking lot to allow patient to walk into church.     Baseline  12/5: does not walk outside    Time  8    Period  Weeks     Status  New    Target Date  06/06/18            Plan - 04/15/18 1204    Clinical Impression Statement  Patient had good motivation and stability in standing and ambulation. Improved weight shift resulting in improved progression of limbs with forward momentum. Sit to stands improved with verbal cueing for LE alignment. Patient continues to be challenged by functional capacity of mobility. Patient will continue to benefit from skilled physical therapy to improve mobility and overall QOL.    Rehab Potential  Good    PT Frequency  2x / week    PT Duration  8 weeks    PT Treatment/Interventions  ADLs/Self Care Home Management;Aquatic Therapy;Biofeedback;Cryotherapy;Electrical Stimulation;Iontophoresis 64m/ml Dexamethasone;Traction;Moist Heat;Ultrasound;DME Instruction;Gait training;Stair training;Functional mobility training;Therapeutic activities;Therapeutic exercise;Balance training;Neuromuscular re-education;Patient/family education;Orthotic Fit/Training;Wheelchair mobility training;Manual techniques;Compression bandaging;Passive range of motion;Dry needling;Energy conservation;Taping;Splinting    PT Next Visit Plan  Outcome measures, goals, and progress note, continue strengthening/balance    PT Home Exercise Plan  to initiate next session (please incorporate hip flexor and adductor stretches in HEP if possible -KG)    Consulted and Agree with Plan of Care  Patient       Patient will benefit from skilled therapeutic intervention in order to improve the following deficits and impairments:  Abnormal gait, Decreased activity tolerance, Decreased balance, Decreased coordination, Decreased endurance, Decreased knowledge of use of DME, Decreased mobility, Decreased range of motion, Decreased safety awareness, Decreased strength, Difficulty walking, Impaired perceived functional ability, Impaired sensation, Impaired tone, Impaired UE functional use, Improper body mechanics, Postural  dysfunction  Visit Diagnosis: Muscle weakness (generalized)  Unsteadiness on feet  Other abnormalities of gait and mobility     Problem List Patient Active Problem List   Diagnosis Date Noted  . Radial styloid tenosynovitis 03/12/2018  . Wheelchair confinement 02/27/2018  . Localized osteoporosis with current pathological fracture with routine healing 01/19/2017  . Wrist fracture 01/16/2017  . Sprain of ankle 03/23/2016  . Closed fracture of lateral malleolus 03/16/2016  . Health care maintenance 01/24/2016  . Blood pressure elevated without history of HTN 10/25/2015  . Essential hypertension 10/25/2015  . Multiple sclerosis ([place] 10/02/2015  . Chronic left shoulder pain 07/19/2015  . [place] ([place] 07/14/2015  . [place] 11/26/2014  . Increased body mass index 11/26/2014  . HPV test positive 11/26/2014  . Status post laparoscopic supracervical hysterectomy 11/26/2014  . Galactorrhea 11/26/2014  . Back ache 05/21/2014  . Adiposity 05/21/2014  . Disordered sleep 05/21/2014  . Muscle spasticity 05/21/2014  . Spasticity 05/21/2014  . Calculus of kidney 12/09/2013  . Renal colic 018/56/3149 . Hypercholesteremia 08/19/2013  .  Hereditary and idiopathic neuropathy 08/19/2013  . Hypercholesterolemia without hypertriglyceridemia 08/19/2013  . Bladder infection, chronic 07/25/2012  . Disorder of bladder function 07/25/2012  . Incomplete bladder emptying 07/25/2012  . Microscopic hematuria 07/25/2012  . Right upper quadrant pain 07/25/2012   Janna Arch, PT, DPT   04/15/2018, 12:06 PM  [place] MAIN [facility] SERVICES [address] [place], [place], [address] Phone: [phone]   Fax:  [phone]  Name: Rajanee Schuelke [place] MRN: [id number] Date of Birth: [birth year]

## 2018-04-15 NOTE — ED Notes (Signed)
Placed external urinary catheter on pt.

## 2018-04-16 ENCOUNTER — Other Ambulatory Visit: Payer: Self-pay

## 2018-04-16 ENCOUNTER — Inpatient Hospital Stay: Payer: PPO

## 2018-04-16 ENCOUNTER — Encounter: Payer: Self-pay | Admitting: Internal Medicine

## 2018-04-16 DIAGNOSIS — L02214 Cutaneous abscess of groin: Secondary | ICD-10-CM | POA: Diagnosis present

## 2018-04-16 DIAGNOSIS — Z6834 Body mass index (BMI) 34.0-34.9, adult: Secondary | ICD-10-CM | POA: Diagnosis not present

## 2018-04-16 DIAGNOSIS — M816 Localized osteoporosis [Lequesne]: Secondary | ICD-10-CM | POA: Diagnosis present

## 2018-04-16 DIAGNOSIS — G35 Multiple sclerosis: Secondary | ICD-10-CM | POA: Diagnosis not present

## 2018-04-16 DIAGNOSIS — D72829 Elevated white blood cell count, unspecified: Secondary | ICD-10-CM | POA: Diagnosis present

## 2018-04-16 DIAGNOSIS — E785 Hyperlipidemia, unspecified: Secondary | ICD-10-CM | POA: Diagnosis present

## 2018-04-16 DIAGNOSIS — Z90711 Acquired absence of uterus with remaining cervical stump: Secondary | ICD-10-CM | POA: Diagnosis not present

## 2018-04-16 DIAGNOSIS — N319 Neuromuscular dysfunction of bladder, unspecified: Secondary | ICD-10-CM | POA: Diagnosis present

## 2018-04-16 DIAGNOSIS — L02215 Cutaneous abscess of perineum: Secondary | ICD-10-CM | POA: Diagnosis present

## 2018-04-16 DIAGNOSIS — E7800 Pure hypercholesterolemia, unspecified: Secondary | ICD-10-CM | POA: Diagnosis present

## 2018-04-16 DIAGNOSIS — I10 Essential (primary) hypertension: Secondary | ICD-10-CM | POA: Diagnosis present

## 2018-04-16 DIAGNOSIS — R110 Nausea: Secondary | ICD-10-CM | POA: Diagnosis present

## 2018-04-16 DIAGNOSIS — E860 Dehydration: Secondary | ICD-10-CM | POA: Diagnosis present

## 2018-04-16 DIAGNOSIS — I471 Supraventricular tachycardia: Secondary | ICD-10-CM | POA: Diagnosis present

## 2018-04-16 DIAGNOSIS — R739 Hyperglycemia, unspecified: Secondary | ICD-10-CM | POA: Diagnosis present

## 2018-04-16 DIAGNOSIS — E669 Obesity, unspecified: Secondary | ICD-10-CM | POA: Diagnosis present

## 2018-04-16 DIAGNOSIS — N764 Abscess of vulva: Secondary | ICD-10-CM | POA: Diagnosis present

## 2018-04-16 DIAGNOSIS — N739 Female pelvic inflammatory disease, unspecified: Secondary | ICD-10-CM | POA: Diagnosis present

## 2018-04-16 DIAGNOSIS — Z87891 Personal history of nicotine dependence: Secondary | ICD-10-CM | POA: Diagnosis not present

## 2018-04-16 MED ORDER — ONDANSETRON HCL 4 MG/2ML IJ SOLN
4.0000 mg | Freq: Four times a day (QID) | INTRAMUSCULAR | Status: DC | PRN
  Administered 2018-04-17 – 2018-04-18 (×3): 4 mg via INTRAVENOUS
  Filled 2018-04-16 (×3): qty 2

## 2018-04-16 MED ORDER — DOXYCYCLINE HYCLATE 100 MG PO TABS
100.0000 mg | ORAL_TABLET | Freq: Two times a day (BID) | ORAL | Status: DC
  Administered 2018-04-16: 100 mg via ORAL
  Filled 2018-04-16 (×2): qty 1

## 2018-04-16 MED ORDER — SODIUM CHLORIDE 0.9 % IV SOLN
INTRAVENOUS | Status: DC | PRN
  Administered 2018-04-16: 21:00:00 30 mL via INTRAVENOUS
  Administered 2018-04-16: 500 mL via INTRAVENOUS
  Administered 2018-04-17: 30 mL via INTRAVENOUS
  Administered 2018-04-18: 17:00:00 500 mL via INTRAVENOUS
  Administered 2018-04-18: 20:00:00 30 mL via INTRAVENOUS
  Administered 2018-04-19: 20 mL via INTRAVENOUS

## 2018-04-16 MED ORDER — DILTIAZEM HCL 25 MG/5ML IV SOLN
10.0000 mg | Freq: Once | INTRAVENOUS | Status: AC
  Administered 2018-04-16: 10 mg via INTRAVENOUS
  Filled 2018-04-16: qty 5

## 2018-04-16 MED ORDER — LACTATED RINGERS IV SOLN
INTRAVENOUS | Status: DC
  Administered 2018-04-16: 12:00:00 via INTRAVENOUS

## 2018-04-16 MED ORDER — ADULT MULTIVITAMIN W/MINERALS CH
1.0000 | ORAL_TABLET | Freq: Every day | ORAL | Status: DC
  Filled 2018-04-16: qty 1

## 2018-04-16 MED ORDER — ENOXAPARIN SODIUM 40 MG/0.4ML ~~LOC~~ SOLN
40.0000 mg | SUBCUTANEOUS | Status: DC
  Administered 2018-04-16 – 2018-04-19 (×4): 40 mg via SUBCUTANEOUS
  Filled 2018-04-16 (×4): qty 0.4

## 2018-04-16 MED ORDER — LORAZEPAM 2 MG/ML IJ SOLN: 2.0000 mg | Freq: Once | INTRAMUSCULAR | Status: DC

## 2018-04-16 MED ORDER — ONDANSETRON HCL 4 MG PO TABS: 4.0000 mg | ORAL_TABLET | Freq: Four times a day (QID) | ORAL | Status: DC | PRN

## 2018-04-16 MED ORDER — CALCIUM CARBONATE-VITAMIN D 500-200 MG-UNIT PO TABS
1.0000 | ORAL_TABLET | Freq: Every day | ORAL | Status: DC
  Filled 2018-04-16: qty 1

## 2018-04-16 MED ORDER — IRBESARTAN 150 MG PO TABS
150.0000 mg | ORAL_TABLET | Freq: Every day | ORAL | Status: DC
  Administered 2018-04-16 – 2018-04-19 (×4): 150 mg via ORAL
  Filled 2018-04-16 (×4): qty 1

## 2018-04-16 MED ORDER — ACETAMINOPHEN 325 MG PO TABS
650.0000 mg | ORAL_TABLET | Freq: Four times a day (QID) | ORAL | Status: DC | PRN
  Administered 2018-04-17: 650 mg via ORAL
  Filled 2018-04-16: qty 2

## 2018-04-16 MED ORDER — ACETAMINOPHEN 650 MG RE SUPP: 650.0000 mg | Freq: Four times a day (QID) | RECTAL | Status: DC | PRN

## 2018-04-16 MED ORDER — CLINDAMYCIN PHOSPHATE 600 MG/50ML IV SOLN
600.0000 mg | Freq: Three times a day (TID) | INTRAVENOUS | Status: DC
  Administered 2018-04-16 – 2018-04-19 (×9): 600 mg via INTRAVENOUS
  Filled 2018-04-16 (×11): qty 50

## 2018-04-16 MED ORDER — LACTATED RINGERS IV BOLUS
1000.0000 mL | Freq: Once | INTRAVENOUS | Status: AC
  Administered 2018-04-16: 1000 mL via INTRAVENOUS

## 2018-04-16 MED ORDER — BACLOFEN 10 MG PO TABS
20.0000 mg | ORAL_TABLET | Freq: Three times a day (TID) | ORAL | Status: DC
  Administered 2018-04-16 – 2018-04-19 (×11): 20 mg via ORAL
  Filled 2018-04-16 (×12): qty 2

## 2018-04-16 MED ORDER — VITAMIN D 25 MCG (1000 UNIT) PO TABS
2000.0000 [IU] | ORAL_TABLET | Freq: Every day | ORAL | Status: DC
  Administered 2018-04-16 – 2018-04-19 (×4): 2000 [IU] via ORAL
  Filled 2018-04-16 (×4): qty 2

## 2018-04-16 MED ORDER — BISACODYL 5 MG PO TBEC: 5.0000 mg | DELAYED_RELEASE_TABLET | Freq: Every day | ORAL | Status: DC | PRN

## 2018-04-16 MED ORDER — SENNOSIDES-DOCUSATE SODIUM 8.6-50 MG PO TABS: 1.0000 | ORAL_TABLET | Freq: Every evening | ORAL | Status: DC | PRN

## 2018-04-16 MED ORDER — SODIUM CHLORIDE 0.9 % IV SOLN
1000.0000 mg | INTRAVENOUS | Status: DC
  Filled 2018-04-16: qty 8

## 2018-04-16 MED ORDER — METOPROLOL TARTRATE 5 MG/5ML IV SOLN
5.0000 mg | Freq: Once | INTRAVENOUS | Status: AC
  Administered 2018-04-16: 21:00:00 5 mg via INTRAVENOUS
  Filled 2018-04-16: qty 5

## 2018-04-16 NOTE — Care Management Note (Signed)
Case Management Note  Patient Details  Name: Lisa Williams MRN: [id number] Date of Birth: December 05, 1960  Subjective/Objective:                Admitted from home with probable MS flare.  Patient is followed by outpatient therapy at [facility].  Current with PCP. No issues identified accessing medical care or obtaining meds.  PT consult is pending   Action/Plan:   Expected Discharge Date:                  Expected Discharge Plan:     In-House Referral:     Discharge planning Services     Post Acute Care Choice:    Choice offered to:     DME Arranged:    DME Agency:     HH Arranged:    HH Agency:     Status of Service:     If discussed at [employer], dates discussed:    Additional Comments:  Eber Hong, RN 04/16/2018, 3:34 PM

## 2018-04-16 NOTE — H&P (Signed)
[employer] - [place] [facility]   PATIENT NAME: Lisa Williams    MR#:  [id number]  DATE OF BIRTH:  09-05-1960  DATE OF ADMISSION:  04/15/2018  PRIMARY CARE PHYSICIAN: Lauro Regulus, MD   REQUESTING/REFERRING PHYSICIAN: Dionne Bucy, MD  CHIEF COMPLAINT:  No chief complaint on file.   HISTORY OF PRESENT ILLNESS:  Syliva Mee  is a 57 y.o. female with a known history of chronic relapsing/remitting MS (last flare 02/2017; on Rituxan) p/w B/L LE weakness. Pt states her Neurologist is Dr. Theora Master [facility]). She states she had been feeling well, and has been coming to the hospital for P/T 2x/wk. She states that she fell on Sunday (12/08). She was in her bedroom giving herself a sponge bath, and both of her legs suddenly got weak and "went out" from underneath her. She states she called EMS, who helped her up. She denied injury/pain, and was able to ambulate without difficulty thereafter. She felt well for the rest of the day. On Monday (12/09), she states she was again feeling well. She drove herself to and from P/T, and states she had no issues during the session. The rest of her day was relatively uneventful up until ~1800PM, at which time pt states she was in the bathroom, and after toileting, noticed she was unable to stand up due to B/L LE weakness. She denies pain, tingling or numbness. She refused MRI.  Also reportedly has mons pubis abscess, s/p I&D in ED.  PAST MEDICAL HISTORY:   Past Medical History:  Diagnosis Date  . Abdominal pain, right upper quadrant   . Back pain   . Calculus of kidney 12/09/2013  . Chronic back pain    unspecified  . Chronic left shoulder pain 07/19/2015  . Functional disorder of bladder    other  . Galactorrhea 11/26/2014   Chronic   . Hereditary and idiopathic neuropathy 08/19/2013  . HPV test positive   . Hypercholesteremia 08/19/2013  . Incomplete bladder emptying   . Microscopic  hematuria   . MS (multiple sclerosis) (HCC)   . Muscle spasticity 05/21/2014  . Nonspecific findings on examination of urine    other  . Osteopenia   . Status post laparoscopic supracervical hysterectomy 11/26/2014  . Tobacco user 11/26/2014  . Wrist fracture     PAST SURGICAL HISTORY:   Past Surgical History:  Procedure Laterality Date  . bilateral tubal ligation  1996  . BREAST CYST EXCISION Left 2002  . KNEE SURGERY     right  . LAPAROSCOPIC SUPRACERVICAL HYSTERECTOMY  08/05/2013  . ORIF WRIST FRACTURE Left 01/17/2017   Procedure: OPEN REDUCTION INTERNAL FIXATION (ORIF) WRIST FRACTURE;  Surgeon: Lyndle Herrlich, MD;  Location: [facility] ORS;  Service: Orthopedics;  Laterality: Left;  . TUBAL LIGATION Bilateral   . VAGINAL HYSTERECTOMY  03/2006    SOCIAL HISTORY:   Social History   Tobacco Use  . Smoking status: Former Smoker    Packs/day: 0.50    Types: Cigarettes  . Smokeless tobacco: Never Used  Substance Use Topics  . Alcohol use: No    Alcohol/week: 0.0 standard drinks    FAMILY HISTORY:   Family History  Problem Relation Age of Onset  . Sickle cell trait Sister   . Hypertension Sister   . Supraventricular tachycardia Sister   . Hypertension Sister   . Diabetes Maternal Grandmother   . Liver cancer Maternal Grandmother   . Diabetes Maternal Aunt   . Breast  cancer Maternal Aunt        great MAT  . Ovarian cancer Paternal Grandmother   . Nephrolithiasis Father   . Hypertension Father   . Prostate cancer Father   . Kidney disease Father   . Heart failure Father   . Hypertension Sister   . Osteoarthritis Mother   . Hypertension Mother   . Hyperlipidemia Mother   . GU problems Neg Hx   . Urolithiasis Neg Hx     DRUG ALLERGIES:   Allergies  Allergen Reactions  . Amlodipine Other (See Comments)    edema  . Povidone Iodine Rash    REVIEW OF SYSTEMS:   Review of Systems  Constitutional: Negative for chills, diaphoresis, fever, malaise/fatigue and  weight loss.  HENT: Negative for congestion, ear pain, hearing loss, nosebleeds, sinus pain, sore throat and tinnitus.   Eyes: Negative for blurred vision, double vision, photophobia, pain, discharge and redness.  Respiratory: Negative for cough, hemoptysis, sputum production, shortness of breath and wheezing.   Cardiovascular: Negative for chest pain, palpitations, orthopnea, claudication, leg swelling and PND.  Gastrointestinal: Negative for abdominal pain, blood in stool, constipation, diarrhea, heartburn, melena, nausea and vomiting.  Genitourinary: Negative for dysuria, frequency, hematuria and urgency.  Musculoskeletal: Positive for falls. Negative for back pain, joint pain, myalgias and neck pain.  Skin: Negative for itching and rash.  Neurological: Positive for focal weakness and weakness. Negative for dizziness, tingling, tremors, sensory change, speech change, seizures, loss of consciousness and headaches.  Psychiatric/Behavioral: Negative for depression and memory loss. The patient is not nervous/anxious and does not have insomnia.    MEDICATIONS AT HOME:   Prior to Admission medications   Medication Sig Start Date End Date Taking? Authorizing Provider  alendronate (FOSAMAX) 70 MG tablet Take 70 mg by mouth once a week. SUNDAYS 02/27/18 02/27/19 Yes [provider]  baclofen (LIORESAL) 20 MG tablet Take 20 mg by mouth 3 (three) times daily.    Yes [provider]  Cholecalciferol (EQL VITAMIN D3) 25 MCG (1000 UT) tablet Take 2,000 Units by mouth daily.   Yes [provider]  oxybutynin (DITROPAN-XL) 10 MG 24 hr tablet Take 1 tablet (10 mg total) by mouth daily. 03/12/18  Yes Vanna Scotland, MD  telmisartan (MICARDIS) 40 MG tablet Take 40 mg by mouth daily. 10/05/17  Yes [provider]  Calcium Carb-Cholecalciferol (CALCIUM 600+D) 600-800 MG-UNIT TABS Take 1 tablet by mouth daily.    [provider]  Multiple Vitamins-Minerals (MULTIVITAMIN  WITH MINERALS) tablet Take by mouth.    [provider]  oxybutynin (DITROPAN-XL) 5 MG 24 hr tablet  02/27/18   [provider]  RITUXIMAB IV Pt uses every 6 months due October 2018 09/06/16   [provider]      VITAL SIGNS:  Blood pressure (!) 146/49, pulse 84, temperature 98.7 F (37.1 C), temperature source Oral, resp. rate 16, height 5\' 11"  (1.803 m), weight 111.1 kg, SpO2 96 %.  PHYSICAL EXAMINATION:  Physical Exam  Constitutional: She is oriented to person, place, and time. She appears well-developed and well-nourished. She is active and cooperative.  Non-toxic appearance. She does not have a sickly appearance. She does not appear ill. No distress. She is not intubated.  HENT:  Head: Normocephalic and atraumatic.  Mouth/Throat: Oropharynx is clear and moist. No oropharyngeal exudate.  Eyes: Conjunctivae, EOM and lids are normal. No scleral icterus.  Neck: Neck supple. No JVD present. No thyromegaly present.  Cardiovascular: Normal rate, regular rhythm, S1  normal, S2 normal and normal heart sounds.  No extrasystoles are present. Exam reveals no gallop, no S3, no S4, no distant heart sounds and no friction rub.  No murmur heard. Pulmonary/Chest: Effort normal and breath sounds normal. No accessory muscle usage or stridor. No apnea, no tachypnea and no bradypnea. She is not intubated. No respiratory distress. She has no decreased breath sounds. She has no wheezes. She has no rhonchi. She has no rales.  Abdominal: Soft. She exhibits no distension. Bowel sounds are decreased. There is no tenderness. There is no rigidity, no rebound and no guarding.  Musculoskeletal: Normal range of motion. She exhibits no edema or tenderness.  Lymphadenopathy:    She has no cervical adenopathy.  Neurological: She is alert and oriented to person, place, and time. She has normal reflexes. She is not disoriented. She displays no atrophy and no tremor. No cranial nerve deficit or  sensory deficit. Coordination normal.  2/5 B/L LE, 5/5 B/L UE strength. Reflex, cerebellar, sensory (crude touch), CN exams WNL.  Skin: Skin is warm, dry and intact. No rash noted. She is not diaphoretic. No erythema.  Psychiatric: She has a normal mood and affect. Her speech is normal and behavior is normal. Judgment and thought content normal. Cognition and memory are normal.   (+) Hoover's sign. LABORATORY PANEL:   CBC Recent Labs  Lab 04/15/18 2020  WBC 11.0*  HGB 13.4  HCT 38.7  PLT 247   ------------------------------------------------------------------------------------------------------------------  Chemistries  Recent Labs  Lab 04/15/18 2020  NA 138  K 4.2  CL 108  CO2 26  GLUCOSE 114*  BUN 22*  CREATININE 0.73  CALCIUM 9.0  AST 16  ALT 16  ALKPHOS 122  BILITOT 0.5   ------------------------------------------------------------------------------------------------------------------  Cardiac Enzymes No results for input(s): TROPONINI in the last 168 hours. ------------------------------------------------------------------------------------------------------------------  RADIOLOGY:  No results found.    IMPRESSION AND PLAN:   A/P: 56F w/ chronic relapsing/remitting MS (on Rituxan) p/w B/L LE weakness, possible MS flare. Vulvar abscess (s/p I&D in ED). Hyperglycemia, dehydration, leukocytosis, microcytosis (w/o anemia). -B/L LE weakness, possible MS flare: Hx chronic relapsing/remitting MS, on Rituxan. Last flare 02/2017. Sees Dr. [name] (last seen 10/08/2017). P/w B/L LE weakness, 2/5 strength on exam, (+) Hoover sign. Refused MRI. Methylprednisolone 1g IV qD. Neurology consult. -Dehydration: IVF. -Leukocytosis, hyperglycemia, vulvar abscess: Reactive vs. 2/2 steroids vs. 2/2 vulvar abscess. s/p I&D, Doxycycline. Monitor labs. -c/w home meds/formulary subs. -FEN/GI: Cardiac diet. -DVT PPx: Lovenox. -Code status: Full code. -Disposition: Admission, > 2  midnights.   All the records are reviewed and case discussed with ED provider. Management plans discussed with the patient, family and they are in agreement.  CODE STATUS: Full code.  TOTAL TIME TAKING CARE OF THIS PATIENT: 75 minutes.    Barbaraann Rondo M.D on 04/16/2018 at 4:59 AM  Between 7am to 6pm - Pager - [phone]  After 6pm go to [email or web address] - Scientist, research (life sciences) [place] [facility]  Office  [phone]  CC: Primary care physician; Lauro Regulus, MD   Note: This dictation was prepared with Dragon dictation along with smaller phrase technology. Any transcriptional errors that result from this process are unintentional.

## 2018-04-16 NOTE — Consult Note (Signed)
Reason for Consult: MS flare Referring Physician: Alford Highland  CC: Bilateral lower extremity weakness  HPI: Lisa Williams is an 57 y.o. female with significant history of localized osteoporosis with with pathological fracture now healed, hypertension, chronic back pain, hereditary and idiopathic neuropathy, and relapsing - remitting multiple sclerosis presenting to the ED with chief complaints of bilateral lower extremity weakness and inability to ambulate.  Patient states that she has been doing well on her disease modifying drug Rituxan which she gets every 7 months until Sunday 04/14/2018 when she had a fall.  She does state that her right leg give way and she fell without sustaining significant injuries.  Patient state that she drove to PT on Monday without any issues and was able to perform her routine chores at home as usual. However, when she tried to get up from the couch to go the bathroom, she noticed that she could not get up or move both legs. Patient state she had similar episode last year in October and was evaluated in the ED and admitted for monitoring. On arrival to the ED she was found to have symmetric lower extremity motor weakness with no significant sensory deficit. Denies dizziness, vision disturbances, speech difficulty,  headache, nausea or vomiting or other associated focal neurologic deficit.  Patient states she had an abscess and or groin area status post I&D, she states she was supposed to be started on doxycycline.  CBC revealed a hematocrit of 38.7, hemoglobin 13.4, MCV 76.2, platelets 249, WBC 11.0.  CMP was normal except for elevated glucose of 114, and BUN of 22.  UA showed trace leukocytes.  Patient was therefore admitted for further monitoring and management of MS flare.  Patient received methylprednisolone 1 g IV.   Past Medical History:  Diagnosis Date  . Abdominal pain, right upper quadrant   . Back pain   . Calculus of kidney 12/09/2013  . Chronic back  pain    unspecified  . Chronic left shoulder pain 07/19/2015  . Functional disorder of bladder    other  . Galactorrhea 11/26/2014   Chronic   . Hereditary and idiopathic neuropathy 08/19/2013  . HPV test positive   . Hypercholesteremia 08/19/2013  . Incomplete bladder emptying   . Microscopic hematuria   . MS (multiple sclerosis) (HCC)   . Muscle spasticity 05/21/2014  . Nonspecific findings on examination of urine    other  . Osteopenia   . Status post laparoscopic supracervical hysterectomy 11/26/2014  . Tobacco user 11/26/2014  . Wrist fracture     Past Surgical History:  Procedure Laterality Date  . bilateral tubal ligation  1996  . BREAST CYST EXCISION Left 2002  . KNEE SURGERY     right  . LAPAROSCOPIC SUPRACERVICAL HYSTERECTOMY  08/05/2013  . ORIF WRIST FRACTURE Left 01/17/2017   Procedure: OPEN REDUCTION INTERNAL FIXATION (ORIF) WRIST FRACTURE;  Surgeon: Lyndle Herrlich, MD;  Location: [facility] ORS;  Service: Orthopedics;  Laterality: Left;  . TUBAL LIGATION Bilateral   . VAGINAL HYSTERECTOMY  03/2006    Family History  Problem Relation Age of Onset  . Sickle cell trait Sister   . Hypertension Sister   . Supraventricular tachycardia Sister   . Hypertension Sister   . Diabetes Maternal Grandmother   . Liver cancer Maternal Grandmother   . Diabetes Maternal Aunt   . Breast cancer Maternal Aunt        great MAT  . Ovarian cancer Paternal Grandmother   . Nephrolithiasis Father   .  Hypertension Father   . Prostate cancer Father   . Kidney disease Father   . Heart failure Father   . Hypertension Sister   . Osteoarthritis Mother   . Hypertension Mother   . Hyperlipidemia Mother   . GU problems Neg Hx   . Urolithiasis Neg Hx     Social History:  reports that she has quit smoking. Her smoking use included cigarettes. She smoked 0.50 packs per day. She has never used smokeless tobacco. She reports that she does not drink alcohol or use drugs.  Allergies  Allergen  Reactions  . Amlodipine Other (See Comments)    edema  . Povidone Iodine Rash    Medications:  I have reviewed the patient's current medications. Prior to Admission:  Medications Prior to Admission  Medication Sig Dispense Refill Last Dose  . alendronate (FOSAMAX) 70 MG tablet Take 70 mg by mouth once a week. SUNDAYS   04/14/2018 at Unknown  . baclofen (LIORESAL) 20 MG tablet Take 20 mg by mouth 3 (three) times daily.    04/15/2018 at Unknown  . Cholecalciferol (EQL VITAMIN D3) 25 MCG (1000 UT) tablet Take 2,000 Units by mouth daily.   04/15/2018 at Unknown  . oxybutynin (DITROPAN-XL) 10 MG 24 hr tablet Take 1 tablet (10 mg total) by mouth daily. 90 tablet 3 04/15/2018 at Unknown time  . telmisartan (MICARDIS) 40 MG tablet Take 40 mg by mouth daily.   04/15/2018 at Unknown time  . Calcium Carb-Cholecalciferol (CALCIUM 600+D) 600-800 MG-UNIT TABS Take 1 tablet by mouth daily.   Not Taking at Unknown time  . Multiple Vitamins-Minerals (MULTIVITAMIN WITH MINERALS) tablet Take by mouth.   Not Taking at Unknown time  . oxybutynin (DITROPAN-XL) 5 MG 24 hr tablet   0 Not Taking at Unknown time  . RITUXIMAB IV Pt uses every 6 months due October 2018      Scheduled: . baclofen  20 mg Oral TID  . calcium-vitamin D  1 tablet Oral Daily  . cholecalciferol  2,000 Units Oral Daily  . doxycycline  100 mg Oral Q12H  . enoxaparin (LOVENOX) injection  40 mg Subcutaneous Q24H  . irbesartan  150 mg Oral Daily  . multivitamin with minerals  1 tablet Oral Daily    ROS: History obtained from the patient   General ROS: negative for - chills, fatigue, fever, night sweats, weight gain or weight loss Psychological ROS: negative for - behavioral disorder, hallucinations, memory difficulties, mood swings or suicidal ideation Ophthalmic ROS: negative for - blurry vision, double vision, eye pain or loss of vision ENT ROS: negative for - epistaxis, nasal discharge, oral lesions, sore throat, tinnitus or  vertigo Allergy and Immunology ROS: negative for - hives or itchy/watery eyes Hematological and Lymphatic ROS: negative for - bleeding problems, bruising or swollen lymph nodes Endocrine ROS: negative for - galactorrhea, hair pattern changes, polydipsia/polyuria or temperature intolerance Respiratory ROS: negative for - cough, hemoptysis, shortness of breath or wheezing Cardiovascular ROS: negative for - chest pain, dyspnea on exertion, edema or irregular heartbeat Gastrointestinal ROS: negative for - abdominal pain, diarrhea, hematemesis, nausea/vomiting or stool incontinence Genito-Urinary ROS: negative for - dysuria, hematuria, incontinence or urinary frequency/urgency Musculoskeletal ROS: Positive for- joint and muscular weakness Neurological ROS: as noted in HPI Dermatological ROS: negative for rash and skin lesion changes  Physical Examination: Blood pressure 129/86, pulse 85, temperature 98 F (36.7 C), temperature source Oral, resp. rate 20, height 5\' 11"  (1.803 m), weight 111.1 kg, SpO2 96 %.  HEENT-  Normocephalic, no lesions, without obvious abnormality.  Normal external eye and conjunctiva.  Normal TM's bilaterally.  Normal auditory canals and external ears. Normal external nose, mucus membranes and septum.  Normal pharynx. Cardiovascular- S1, S2 normal, pulses palpable throughout   Lungs- chest clear, no wheezing, rales, normal symmetric air entry Abdomen- soft, non-tender; bowel sounds normal; no masses,  no organomegaly Extremities- no edema Lymph-no adenopathy palpable Musculoskeletal-no joint tenderness, deformity or swelling Skin-warm and dry, no hyperpigmentation, vitiligo, or suspicious lesions  Neurological Exam   Mental Status: Alert, oriented, thought content appropriate.  Speech fluent without evidence of aphasia.  Able to follow 3 step commands without difficulty. Attention span and concentration seemed appropriate  Cranial Nerves: II: Discs flat bilaterally;  Visual fields grossly normal, pupils equal, round, reactive to light and accommodation III,IV, VI: ptosis not present, extra-ocular motions intact bilaterally V,VII: smile symmetric, facial light touch sensation intact VIII: hearing normal bilaterally IX,X: gag reflex present XI: bilateral shoulder shrug XII: midline tongue extension Motor: Right :  Upper extremity   5/5 Without pronator drift      Left: Upper extremity   5/5 without pronator drift Unable to lift either leg off the bed with the right being weaker than the left Sensory: Pinprick and light touch intact bilaterally Deep Tendon Reflexes: 2+ and symmetric throughout Plantars: Right: mute                             Left: mute Cerebellar: Finger-to-nose testing intact bilaterally. Heel to shin testing normal bilaterally Gait: not tested due to safety concerns  Data Reviewed  Laboratory Studies:   Basic Metabolic Panel: Recent Labs  Lab 04/15/18 2020  NA 138  K 4.2  CL 108  CO2 26  GLUCOSE 114*  BUN 22*  CREATININE 0.73  CALCIUM 9.0    Liver Function Tests: Recent Labs  Lab 04/15/18 2020  AST 16  ALT 16  ALKPHOS 122  BILITOT 0.5  PROT 7.4  ALBUMIN 4.0   No results for input(s): LIPASE, AMYLASE in the last 168 hours. No results for input(s): AMMONIA in the last 168 hours.  CBC: Recent Labs  Lab 04/15/18 2020  WBC 11.0*  NEUTROABS 8.3*  HGB 13.4  HCT 38.7  MCV 76.2*  PLT 247    Cardiac Enzymes: No results for input(s): CKTOTAL, CKMB, CKMBINDEX, TROPONINI in the last 168 hours.  BNP: Invalid input(s): POCBNP  CBG: No results for input(s): GLUCAP in the last 168 hours.  Microbiology: Results for orders placed or performed during the hospital encounter of 01/16/17  Surgical PCR screen     Status: None   Collection Time: 01/16/17  3:00 PM  Result Value Ref Range Status   MRSA, PCR NEGATIVE NEGATIVE Final   Staphylococcus aureus NEGATIVE NEGATIVE Final    Comment: (NOTE) The Xpert SA  Assay (FDA approved for NASAL specimens in patients 52 years of age and older), is one component of a comprehensive surveillance program. It is not intended to diagnose infection nor to guide or monitor treatment.     Coagulation Studies: No results for input(s): LABPROT, INR in the last 72 hours.  Urinalysis:  Recent Labs  Lab 04/15/18 2020  COLORURINE YELLOW*  LABSPEC 1.026  PHURINE 5.0  GLUCOSEU NEGATIVE  HGBUR NEGATIVE  BILIRUBINUR NEGATIVE  KETONESUR NEGATIVE  PROTEINUR NEGATIVE  NITRITE NEGATIVE  LEUKOCYTESUR TRACE*    Lipid Panel:     Component Value Date/Time  CHOL 187 11/29/2016 0000   TRIG 64 11/29/2016 0000   HDL 55 11/29/2016 0000   CHOLHDL 3.4 11/29/2016 0000   LDLCALC 119 (H) 11/29/2016 0000    HgbA1C:  Lab Results  Component Value Date   HGBA1C 5.1 11/29/2016    Urine Drug Screen:  No results found for: LABOPIA, COCAINSCRNUR, LABBENZ, AMPHETMU, THCU, LABBARB  Alcohol Level: No results for input(s): ETH in the last 168 hours.  Other results: EKG: there are no previous tracings available for comparison.  Imaging: No results found.  Patient seen and examined.  Clinical course and management discussed.  Necessary edits performed.  I agree with the above.  Assessment and plan of care developed and discussed below.   Assessment: 57 y.o female with significant history of localized osteoporosis with with pathological fracture now healed, hypertension, chronic back pain, hereditary and idiopathic neuropathy, and relapsing - remitting multiple sclerosis presenting to the ED with chief complaints of bilateral lower extremity weakness and inability to ambulate.  Etiology likely MS worsening in the setting of infectious process currently being treated with doxycycline.  Patient's currently on disease modifying drug Rituxan every 7 months.  Last dose in October, 2019 due for next dose in February, 2020. CBC revealed a hematocrit of 38.7, hemoglobin 13.4, MCV  76.2, platelets 249, WBC 11.0.  CMP was normal except for elevated glucose of 114, and BUN of 22.  UA showed trace leukocytes.  Patient received methylprednisolone 1 g IV. With patient on Rituxan and having concurrent infection do not want to treat with steroids unless necessary since patient has not had a great response to steroids in the past.    Recommendations: 1. Hold Solumedrol for now 2. MRI of the brain and lumbar spine with and without contrast.  If evidence of an acute contrast enhancing lesion would start steroids, otherwise would continue with PT.   3. Agree with antibiotics   This patient was staffed with Dr. Verlon Au, Thad Ranger who personally evaluated patient, reviewed documentation and agreed with assessment and plan of care as above.  Webb Silversmith, DNP, FNP-BC Board certified Nurse Practitioner Neurology Department  04/16/2018, 9:53 AM    Thana Farr, MD Neurology [phone]  04/16/2018  2:14 PM

## 2018-04-16 NOTE — Progress Notes (Signed)
Patient ID: Lisa Williams, female   DOB: 05/17/1960, 57 y.o.   MRN: [id number]  [employer] PROGRESS NOTE  Lisa Williams OZH:[id number] DOB: 08-25-1960 DOA: 04/15/2018 PCP: Lauro Regulus, MD  HPI/Subjective: Patient states that she has been walking with physical therapy recently.  She has been having lower extremity weakness.  She also had a large boil in her left groin that was lanced in the emergency room.  Objective: Vitals:   04/16/18 0815 04/16/18 1345  BP: 129/86 138/61  Pulse: 85 96  Resp: 20 20  Temp: 98 F (36.7 C) 98.4 F (36.9 C)  SpO2: 96% 94%    Filed Weights   04/15/18 1926  Weight: 111.1 kg    ROS: Review of Systems  Constitutional: Negative for chills and fever.  Eyes: Negative for blurred vision.  Respiratory: Negative for cough and shortness of breath.   Cardiovascular: Negative for chest pain.  Gastrointestinal: Positive for abdominal pain. Negative for constipation, diarrhea, nausea and vomiting.  Genitourinary: Negative for dysuria.  Musculoskeletal: Negative for joint pain.  Neurological: Negative for dizziness and headaches.   Exam: Physical Exam  Constitutional: She is oriented to person, place, and time.  HENT:  Nose: No mucosal edema.  Mouth/Throat: No oropharyngeal exudate or posterior oropharyngeal edema.  Eyes: Pupils are equal, round, and reactive to light. Conjunctivae, EOM and lids are normal.  Neck: No JVD present. Carotid bruit is not present. No edema present. No thyroid mass and no thyromegaly present.  Cardiovascular: S1 normal and S2 normal. Exam reveals no gallop.  No murmur heard. Pulses:      Dorsalis pedis pulses are 2+ on the right side, and 2+ on the left side.  Respiratory: No respiratory distress. She has no wheezes. She has no rhonchi. She has no rales.  GI: Soft. Bowel sounds are normal. There is no tenderness.  Musculoskeletal:       Right ankle: She exhibits swelling.       Left  ankle: She exhibits swelling.  Lymphadenopathy:    She has no cervical adenopathy.  Neurological: She is alert and oriented to person, place, and time.  Barely able to straight leg raise bilateral lower extremities.  Just able to get her heels off the bed.  Skin: Skin is warm. Nails show no clubbing.  Boil that was lanced in the left lateral vulvar area.  Slight surrounding erythema.  Nurse present during exam  Psychiatric: She has a normal mood and affect.      Data Reviewed: Basic Metabolic Panel: Recent Labs  Lab 04/15/18 2020  NA 138  K 4.2  CL 108  CO2 26  GLUCOSE 114*  BUN 22*  CREATININE 0.73  CALCIUM 9.0   Liver Function Tests: Recent Labs  Lab 04/15/18 2020  AST 16  ALT 16  ALKPHOS 122  BILITOT 0.5  PROT 7.4  ALBUMIN 4.0   CBC: Recent Labs  Lab 04/15/18 2020  WBC 11.0*  NEUTROABS 8.3*  HGB 13.4  HCT 38.7  MCV 76.2*  PLT 247     Scheduled Meds: . baclofen  20 mg Oral TID  . cholecalciferol  2,000 Units Oral Daily  . enoxaparin (LOVENOX) injection  40 mg Subcutaneous Q24H  . irbesartan  150 mg Oral Daily  . LORazepam  2 mg Intravenous Once   Continuous Infusions: . clindamycin (CLEOCIN) IV      Assessment/Plan:  1. Multiple sclerosis exacerbation.  Received 1 dose of high-dose Solu-Medrol already.  Neurology wanted to hold  on further doses of Solu-Medrol until she obtains an MRI of the brain and lumbar spine.  If there is evidence of enhancing lesion would restart steroids if not would hold off on further steroids. 2. Boil left vulvar area.  Change oral doxycycline to IV clindamycin. 3. Essential hypertension on irbesartan 4. Weakness.  Physical therapy evaluation  Code Status:     Code Status Orders  (From admission, onward)         Start     Ordered   04/16/18 0820  Full code  Continuous     04/16/18 0819        Code Status History    Date Active Date Inactive Code Status Order ID Comments User Context   03/05/2017 1906  03/14/2017 2203 Full Code [id number]  Alford Highland, MD ED   01/16/2017 1244 01/18/2017 2127 Full Code [id number]  Altamese Dilling, MD Inpatient   03/24/2016 1213 03/27/2016 2120 Full Code [id number]  Adrian Saran, MD Inpatient   10/02/2015 1959 10/05/2015 1730 Full Code [id number]  Adrian Saran, MD Inpatient   07/14/2015 1605 07/17/2015 2120 Full Code [id number]  Enid Baas, MD Inpatient    Advance Directive Documentation     Most Recent Value  Type of Advance Directive  Living will  Pre-existing out of facility DNR order (yellow form or pink MOST form)  -  "MOST" Form in Place?  -     Family Communication: Sister at the bedside Disposition Plan: To be determined  Consultants:  Neurology  Antibiotics:  Clindamycin  Time spent: 35 minutes  Marycruz Boehner [employer]

## 2018-04-16 NOTE — Progress Notes (Signed)
Heart rate up to 160 bpm, will check EKG stat, ordered 1 dose of metoprolol 5 mg 1 time.  Will monitor on telemetry.

## 2018-04-16 NOTE — ED Notes (Signed)
Rounding on pt, fluid not infusing due to position. Repositioned and now infusing.

## 2018-04-16 NOTE — ED Notes (Signed)
Pt states she had a boil lanced earlier this morning and is suppose to be prescribed abx, no order noted, hospitalist paged.

## 2018-04-17 ENCOUNTER — Inpatient Hospital Stay (HOSPITAL_COMMUNITY)
Admit: 2018-04-17 | Discharge: 2018-04-17 | Disposition: A | Discharge status: Home / Self Care | Payer: PPO | Attending: Internal Medicine | Admitting: Internal Medicine

## 2018-04-17 ENCOUNTER — Ambulatory Visit: Payer: PPO

## 2018-04-17 ENCOUNTER — Inpatient Hospital Stay: Payer: PPO

## 2018-04-17 DIAGNOSIS — I471 Supraventricular tachycardia: Secondary | ICD-10-CM

## 2018-04-17 LAB — ECHOCARDIOGRAM COMPLETE
Height: 71 in
Weight: 3920 oz

## 2018-04-17 LAB — TSH: TSH: 0.442 u[IU]/mL (ref 0.350–4.500)

## 2018-04-17 LAB — MAGNESIUM: Magnesium: 2.2 mg/dL (ref 1.7–2.4)

## 2018-04-17 MED ORDER — OXYBUTYNIN CHLORIDE ER 10 MG PO TB24
10.0000 mg | ORAL_TABLET | Freq: Every day | ORAL | Status: DC
  Administered 2018-04-17 – 2018-04-19 (×3): 10 mg via ORAL
  Filled 2018-04-17 (×3): qty 1

## 2018-04-17 MED ORDER — LORAZEPAM 2 MG/ML IJ SOLN
1.0000 mg | Freq: Once | INTRAMUSCULAR | Status: AC
  Administered 2018-04-17: 23:00:00 1 mg via INTRAVENOUS
  Filled 2018-04-17: qty 1

## 2018-04-17 MED ORDER — GADOBUTROL 1 MMOL/ML IV SOLN
10.0000 mL | Freq: Once | INTRAVENOUS | Status: AC | PRN
  Administered 2018-04-17: 10 mL via INTRAVENOUS

## 2018-04-17 MED ORDER — LORAZEPAM 2 MG/ML IJ SOLN
1.0000 mg | Freq: Once | INTRAMUSCULAR | Status: AC
  Administered 2018-04-17: 1 mg via INTRAVENOUS
  Filled 2018-04-17: qty 1

## 2018-04-17 NOTE — Progress Notes (Signed)
*  PRELIMINARY RESULTS* Echocardiogram 2D Echocardiogram has been performed.  Cristela Blue 04/17/2018, 10:57 AM

## 2018-04-17 NOTE — Consult Note (Signed)
Cardiology Consultation:   Patient ID: Lisa Williams MRN: [id number]; DOB: 1961/03/02  Admit date: 04/15/2018 Date of Consult: 04/17/2018  Primary Care Provider: Lauro Regulus, MD Primary Cardiologist: Lisa Williams, Dr. Okey Williams Primary Electrophysiologist:  None    Patient Profile:   Lisa Williams is a 57 y.o. female with a hx of HLD, HTN, relapsing and remitting multiple sclerosis, neuropathy, tobacco use (quit 07/2016), osteoporosis, chronic back pain, and no known cardiac history who is being seen today for the evaluation of possible Afib with RVR at the request of Dr. Caryn Williams.  History of Present Illness:   Lisa Williams is a 57 yo female with PMH as above and no known cardiac history other than HLD (not on a statin) and previous history of HTN now controlled off of medications. Family history includes a sisters, mother, and father with HTN and DVTs.  EMR does note a visit with Lisa Williams in 2011 with no associated visit documentation as well as a 2017 echocardiogram as copied in CV studies below. At that time, EF was 55-65% with mild concentric hypertrophy.   04/15/2018: Patient presented and admitted to [facility] ED for bilateral lower extremity weakness and inability to ambulate. 04/17/2018: Cardiology consulted and asked to evaluate for possible new onset Afib with RVR. Telemetry from today 12/11 shows earlier AT / SVT with patient currently sinus bradycardia and not Atrial Fibrillation. Recommendations below.  Past Medical History:  Diagnosis Date  . Abdominal pain, right upper quadrant   . Back pain   . Calculus of kidney 12/09/2013  . Chronic back pain    unspecified  . Chronic left shoulder pain 07/19/2015  . Functional disorder of bladder    other  . Galactorrhea 11/26/2014   Chronic   . Hereditary and idiopathic neuropathy 08/19/2013  . HPV test positive   . Hypercholesteremia 08/19/2013  . Incomplete bladder emptying   . Microscopic hematuria   . MS  (multiple sclerosis) (HCC)   . Muscle spasticity 05/21/2014  . Nonspecific findings on examination of urine    other  . Osteopenia   . Status post laparoscopic supracervical hysterectomy 11/26/2014  . Tobacco user 11/26/2014  . Wrist fracture     Past Surgical History:  Procedure Laterality Date  . bilateral tubal ligation  1996  . BREAST CYST EXCISION Left 2002  . KNEE SURGERY     right  . LAPAROSCOPIC SUPRACERVICAL HYSTERECTOMY  08/05/2013  . ORIF WRIST FRACTURE Left 01/17/2017   Procedure: OPEN REDUCTION INTERNAL FIXATION (ORIF) WRIST FRACTURE;  Surgeon: Lyndle Herrlich, MD;  Location: [facility] ORS;  Service: Orthopedics;  Laterality: Left;  . TUBAL LIGATION Bilateral   . VAGINAL HYSTERECTOMY  03/2006     Home Medications:  Prior to Admission medications   Medication Sig Start Date End Date Taking? Authorizing Provider  alendronate (FOSAMAX) 70 MG tablet Take 70 mg by mouth once a week. SUNDAYS 02/27/18 02/27/19 Yes [provider]  baclofen (LIORESAL) 20 MG tablet Take 20 mg by mouth 3 (three) times daily.    Yes [provider]  Cholecalciferol (EQL VITAMIN D3) 25 MCG (1000 UT) tablet Take 2,000 Units by mouth daily.   Yes [provider]  oxybutynin (DITROPAN-XL) 10 MG 24 hr tablet Take 1 tablet (10 mg total) by mouth daily. 03/12/18  Yes Vanna Scotland, MD  telmisartan (MICARDIS) 40 MG tablet Take 40 mg by mouth daily. 10/05/17  Yes [provider]  Calcium Carb-Cholecalciferol (CALCIUM 600+D) 600-800 MG-UNIT TABS Take 1 tablet  by mouth daily.    [provider]  Multiple Vitamins-Minerals (MULTIVITAMIN WITH MINERALS) tablet Take by mouth.    [provider]  oxybutynin (DITROPAN-XL) 5 MG 24 hr tablet  02/27/18   [provider]  RITUXIMAB IV Pt uses every 6 months due October 2018 09/06/16   [provider]    Inpatient Medications: Scheduled Meds: . baclofen  20 mg Oral TID  . cholecalciferol  2,000 Units  Oral Daily  . enoxaparin (LOVENOX) injection  40 mg Subcutaneous Q24H  . irbesartan  150 mg Oral Daily  . LORazepam  2 mg Intravenous Once   Continuous Infusions: . sodium chloride 30 mL (04/17/18 0403)  . clindamycin (CLEOCIN) IV 600 mg (04/17/18 0405)   PRN Meds: sodium chloride, acetaminophen **OR** acetaminophen, bisacodyl, ondansetron **OR** ondansetron (ZOFRAN) IV, senna-docusate  Allergies:    Allergies  Allergen Reactions  . Amlodipine Other (See Comments)    edema  . Povidone Iodine Rash    Social History:   Social History   Socioeconomic History  . Marital status: Single    Spouse name: Not on file  . Number of children: 0  . Years of education: 2  . Highest education level: Not on file  Occupational History  . Not on file  Social Needs  . Financial resource strain: Not on file  . Food insecurity:    Worry: Not on file    Inability: Not on file  . Transportation needs:    Medical: Not on file    Non-medical: Not on file  Tobacco Use  . Smoking status: Former Smoker    Packs/day: 0.50    Types: Cigarettes  . Smokeless tobacco: Never Used  Substance and Sexual Activity  . Alcohol use: No    Alcohol/week: 0.0 standard drinks  . Drug use: No  . Sexual activity: Not Currently    Birth control/protection: Surgical  Lifestyle  . Physical activity:    Days per week: Not on file    Minutes per session: Not on file  . Stress: Not on file  Relationships  . Social connections:    Talks on phone: Not on file    Gets together: Not on file    Attends religious service: Not on file    Active member of club or organization: Not on file    Attends meetings of clubs or organizations: Not on file    Relationship status: Not on file  . Intimate partner violence:    Fear of current or ex partner: Not on file    Emotionally abused: Not on file    Physically abused: Not on file    Forced sexual activity: Not on file  Other Topics Concern  . Not on file  Social  History Narrative   Lives at home with parents. Has a walker for ambulation.    Family History:    Family History  Problem Relation Age of Onset  . Sickle cell trait Sister   . Hypertension Sister   . Supraventricular tachycardia Sister   . Hypertension Sister   . Diabetes Maternal Grandmother   . Liver cancer Maternal Grandmother   . Diabetes Maternal Aunt   . Breast cancer Maternal Aunt        great MAT  . Ovarian cancer Paternal Grandmother   . Nephrolithiasis Father   . Hypertension Father   . Prostate cancer Father   . Kidney disease Father   . Heart failure Father   . Hypertension Sister   .  Osteoarthritis Mother   . Hypertension Mother   . Hyperlipidemia Mother   . GU problems Neg Hx   . Urolithiasis Neg Hx      ROS:  Please see the history of present illness.  Review of Systems  Constitutional: Negative for chills, diaphoresis and fever.  Respiratory: Negative for hemoptysis.   Cardiovascular: Positive for leg swelling. Negative for chest pain, palpitations and orthopnea.       Earlier felt pounding in chest / rapid heart rate, now resolved  Gastrointestinal: Negative for abdominal pain and diarrhea.  Psychiatric/Behavioral: Negative for substance abuse.    All other ROS reviewed and negative.     Physical Exam/Data:   Vitals:   04/16/18 0815 04/16/18 1345 04/16/18 1950 04/17/18 0359  BP: 129/86 138/61 132/80 110/60  Pulse: 85 96 (!) 167 61  Resp: 20 20    Temp: 98 F (36.7 C) 98.4 F (36.9 C) 98.4 F (36.9 C) 97.7 F (36.5 C)  TempSrc: Oral Oral Oral Oral  SpO2: 96% 94% 96% 96%  Weight:      Height:        Intake/Output Summary (Last 24 hours) at 04/17/2018 0735 Last data filed at 04/16/2018 2112 Gross per 24 hour  Intake 440 ml  Output 850 ml  Net -410 ml   Filed Weights   04/15/18 1926  Weight: 111.1 kg   Body mass index is 34.17 kg/m.  General:  Obese female, Well nourished, well developed, in no acute distress HEENT: normal Neck:  JVD difficult to assess d/t body habitus Vascular: No carotid bruits; FA pulses 2+ bilaterally without bruits  Cardiac:  normal S1, S2; bradycardic and regular; no murmur  Lungs:  clear to auscultation bilaterally, no wheezing, rhonchi or rales  Abd: soft, nontender, no hepatomegaly  Ext: trace bilateral edema Musculoskeletal:  No deformities, BUE and BLE strength normal and equal Skin: warm and dry  Neuro:  CNs 2-12 intact, no focal abnormalities noted Psych:  Normal affect   EKG:  The EKG was personally reviewed and demonstrates:  Telemetry:  Telemetry was personally reviewed and demonstrates:  Currently sinus bradycardia, earlier AT / SVT   Relevant CV Studies: 10/03/2015 TTE Study Conclusions - Left ventricle: There was mild concentric hypertrophy. Systolic   function was normal. The estimated ejection fraction was in the   range of 55% to 65%. - Aortic valve: Valve area (Vmax): 2.62 cm^2.  ------------------------------------------------------------------- TTE 04/17/2018 Study Conclusions - Left ventricle: The cavity size was normal. Wall thickness was   increased increased in a pattern of mild to moderate LVH.   Systolic function was normal. The estimated ejection fraction was   in the range of 60% to 65%. Wall motion was normal; there were no   regional wall motion abnormalities. Doppler parameters are   consistent with abnormal left ventricular relaxation (grade 1   diastolic dysfunction). - Right ventricle: The cavity size was normal. Systolic function   was normal.  Laboratory Data:  Chemistry Recent Labs  Lab 04/15/18 2020  NA 138  K 4.2  CL 108  CO2 26  GLUCOSE 114*  BUN 22*  CREATININE 0.73  CALCIUM 9.0  GFRNONAA >60  GFRAA >60  ANIONGAP 4*    Recent Labs  Lab 04/15/18 2020  PROT 7.4  ALBUMIN 4.0  AST 16  ALT 16  ALKPHOS 122  BILITOT 0.5   Hematology Recent Labs  Lab 04/15/18 2020  WBC 11.0*  RBC 5.08  HGB 13.4  HCT 38.7  MCV  76.2*  MCH 26.4  MCHC 34.6  RDW 13.9  PLT 247   Cardiac EnzymesNo results for input(s): TROPONINI in the last 168 hours. No results for input(s): TROPIPOC in the last 168 hours.  BNPNo results for input(s): BNP, PROBNP in the last 168 hours.  DDimer No results for input(s): DDIMER in the last 168 hours.  Radiology/Studies:  No results found.  Assessment and Plan:   SVT/AT - Currently sinus bradycardia. Earlier AT/SVT.  - Electrolytes within limits / at goal with Mg 2.2, K 4.2 - TSH 0.442 - No additional medications recommended. Would not recommend BB d/t sinus bradycardia. Patient currently sinus. Could consider outpatient monitor and follow-up with cardiology. Echo as above shows normal EF, G1DD  Remainder per IM   For questions or updates, please contact [facility] Please consult [email or web address] for contact info under     Signed, Lennon Alstrom, PA-C  04/17/2018 7:35 AM

## 2018-04-17 NOTE — Progress Notes (Signed)
pts HR is in the 160s and has sustained for at least 15 minutes.  MD paged.  EKG ordered, metoprolol ordered and cardiac monitoring added.  Will continue to monitor and notify MD as needed.

## 2018-04-17 NOTE — Evaluation (Signed)
Physical Therapy Evaluation Patient Details Name: Lisa Williams MRN: [id number] DOB: [birth year] Today's Date: 04/17/2018   History of Present Illness  Pt is 57 y.o female with significant history of localized osteoporosis with with pathological fracture now healed, hypertension, chronic back pain, hereditary and idiopathic neuropathy, and relapsing - remitting multiple sclerosis presented to the ED with chief complaints of bilateral lower extremity weakness and inability to ambulate. new onset Afib with RVR during stay, PT spoke with cardiology prior to session for consent.   Clinical Impression  Patient alert and oriented at start of session, eager to mobilize. Pt reported living in 2 story home (able to live on first floor), with one small threshold step to enter, parents and sister live there as well. Ambulates in home with RW for small distances, stated she spends a lot of her time in her WC. Sits for bathing, dresses independently, able to drive, perform some cooking/cleaning at home. Pt has family available 24/7 if needed. Prior to admission had outpatient pt and has been working on improving functional tasks (ambulating, transfers).  Pt was able to move LE with physical assist in bed, demonstrated good upper body strength. Mobilized to EOB from elevated HOB and use of bed rails. Did use UE to help move LE. Sit <> stand multiple trials during session, CGA-minAx1 with RW. PT needed to stabilize RW for safety. Unable to rise from low chair surface. Pt ambulated a total of 32ft with RW, CGA and braces in place, HR in 130's, no complaints from pt. Overall the patient demonstrated limitations in mobility, especially transfers, as well as other deficits (see "PT Problem List") that impede patient's ability to perform functional tasks. Recommendation at discharge for HHPT with supervision for mobility/OOB due to current assist needed for transfers/ambulation.      Follow Up Recommendations  Home health PT;Supervision for mobility/OOB    Equipment Recommendations  None recommended by PT(Pt has RW and WC at home)    Recommendations for Other Services       Precautions / Restrictions Precautions Precautions: Fall Required Braces or Orthoses: Other Brace Other Brace: bilateral AFOs Restrictions Weight Bearing Restrictions: No      Mobility  Bed Mobility Overal bed mobility: Modified Independent             General bed mobility comments: HOB elevated  Transfers Overall transfer level: Needs assistance Equipment used: Rolling walker (2 wheeled) Transfers: Sit to/from Stand Sit to Stand: Min guard;Min assist         General transfer comment: sit to stand x3 this session from elevated surfaces. Unable to complete sit to stand transfer from chair surface with 2 pillows. Improved ability from further elevated surface  Ambulation/Gait Ambulation/Gait assistance: Min guard Gait Distance (Feet): 18 Feet Assistive device: Rolling walker (2 wheeled)       General Gait Details: very decreased gait velocity. Heavy use of UE. difficulty with TKE bilaterally, pt with increased effort for hip flexion to improve foot clearance bilaterally as well. Leg braces and shoes in place.   Stairs            Wheelchair Mobility    Modified Rankin (Stroke Patients Only)       Balance Overall balance assessment: Mild deficits observed, not formally tested  Pertinent Vitals/Pain Pain Assessment: No/denies pain    Home Living Family/patient expects to be discharged to:: Private residence Living Arrangements: Parent;Other relatives(sister) Available Help at Discharge: Family;Available 24 hours/day Type of Home: House Home Access: Stairs to enter Entrance Stairs-Rails: Right;Left Entrance Stairs-Number of Steps: 1 Home Layout: Two level;Able to live on main level with bedroom/bathroom Home Equipment:  Dan Humphreys - 2 wheels;Wheelchair - manual;Hand held shower head;Bedside commode Additional Comments: Pt reports 1 fall on Sunday (04/14/2018)    Prior Function Level of Independence: Independent with assistive device(s)         Comments: ambulates short distances with outpatient PT, to WC at home with RW. Seated sponge baths, can participate in IADLS, drives     Hand Dominance   Dominant Hand: Right    Extremity/Trunk Assessment   Upper Extremity Assessment Upper Extremity Assessment: Overall WFL for tasks assessed(grossly 4+/5)    Lower Extremity Assessment Lower Extremity Assessment: Generalized weakness(poor ankle DF/PF bilaterally, able to perform heel slides, hip abduction, SLR with AAROM)    Cervical / Trunk Assessment Cervical / Trunk Assessment: Normal  Communication   Communication: No difficulties  Cognition Arousal/Alertness: Awake/alert Behavior During Therapy: WFL for tasks assessed/performed Overall Cognitive Status: Within Functional Limits for tasks assessed                                        General Comments      Exercises     Assessment/Plan    PT Assessment Patient needs continued PT services  PT Problem List Decreased strength;Decreased range of motion;Decreased activity tolerance;Decreased balance;Decreased mobility       PT Treatment Interventions DME instruction;Balance training;Gait training;Neuromuscular re-education;Stair training;Functional mobility training;Patient/family education;Therapeutic activities;Therapeutic exercise;Wheelchair mobility training    PT Goals (Current goals can be found in the Care Plan section)  Acute Rehab PT Goals Patient Stated Goal: To continue to maximize mobility, ambulation, and strength PT Goal Formulation: With patient Time For Goal Achievement: 05/01/18 Potential to Achieve Goals: Fair    Frequency Min 2X/week   Barriers to discharge        Co-evaluation                AM-PAC PT "6 Clicks" Mobility  Outcome Measure Help needed turning from your back to your side while in a flat bed without using bedrails?: A Little Help needed moving from lying on your back to sitting on the side of a flat bed without using bedrails?: A Little Help needed moving to and from a bed to a chair (including a wheelchair)?: A Little Help needed standing up from a chair using your arms (e.g., wheelchair or bedside chair)?: A Little Help needed to walk in hospital room?: A Little Help needed climbing 3-5 steps with a railing? : A Lot 6 Click Score: 17    End of Session Equipment Utilized During Treatment: Gait belt Activity Tolerance: Patient tolerated treatment well Patient left: with chair alarm set;in chair;with family/visitor present;with call bell/phone within reach Nurse Communication: Mobility status;Need for lift equipment PT Visit Diagnosis: Unsteadiness on feet (R26.81);Difficulty in walking, not elsewhere classified (R26.2);Muscle weakness (generalized) (M62.81);History of falling (Z91.81)    Time: 7846-9629 PT Time Calculation (min) (ACUTE ONLY): 41 min   Charges:   PT Evaluation $PT Eval High Complexity: 1 High PT Treatments $Therapeutic Activity: 23-37 mins        Olga Coaster PT, DPT 2:25 PM,04/17/18 [phone]

## 2018-04-17 NOTE — Progress Notes (Addendum)
Subjective: Patient's lower extremity weakness improving. She is now able to lift both legs off the bed today with the right leg still weaker than the left. She had a run of SVT sustaining in the 160s overnight. ECG showed SVT. Echocardiogram pending. Patient denied any symptoms excepts for some palpitation sensation without SOB, dizziness or other associated symptoms.  Objective: Current vital signs: BP 110/60 (BP Location: Right Arm)   Pulse 61   Temp 97.7 F (36.5 C) (Oral)   Resp 20   Ht 5\' 11"  (1.803 m)   Wt 111.1 kg   SpO2 96%   BMI 34.17 kg/m  Vital signs in last 24 hours: Temp:  [97.7 F (36.5 C)-98.4 F (36.9 C)] 97.7 F (36.5 C) (12/11 0359) Pulse Rate:  [61-167] 61 (12/11 0359) Resp:  [20] 20 (12/10 1345) BP: (110-138)/(60-80) 110/60 (12/11 0359) SpO2:  [94 %-96 %] 96 % (12/11 0359)  Intake/Output from previous day: 12/10 0701 - 12/11 0700 In: 440 [P.O.:240; IV Piggyback:200] Out: 850 [Urine:850] Intake/Output this shift: No intake/output data recorded. Nutritional status:  Diet Order            Diet Heart Room service appropriate? Yes; Fluid consistency: Thin  Diet effective now             Neurologic Exam: Mental Status: Alert, oriented, thought content appropriate. Speech fluent without evidence of aphasia. Able to follow 3 step commands without difficulty. Attention span and concentration seemed appropriate  Cranial Nerves: II: Discs flat bilaterally; Visual fields grossly normal, pupils equal, round, reactive to light and accommodation III,IV, VI: ptosis not present, extra-ocular motions intact bilaterally V,VII: smile symmetric, facial light touch sensationintact VIII: hearing normal bilaterally IX,X: gag reflex present XI: bilateral shoulder shrug XII: midline tongue extension Motor: Right :Upper extremity 5/5Without pronator driftLeft: Upper extremity 5/5 without pronator drift Able to lift both leg off the bed today with the  right still weaker than the left Sensory: Pinprick and light touchintact bilaterally Deep Tendon Reflexes: 2+ and symmetric throughout Plantars: Right:muteLeft: mute Cerebellar: Finger-to-nosetesting intact bilaterally.Heel to shin testing normal bilaterally Gait: not tested due to safety concerns  Data Reviewed  Lab Results: Basic Metabolic Panel: Recent Labs  Lab 04/15/18 2020  NA 138  K 4.2  CL 108  CO2 26  GLUCOSE 114*  BUN 22*  CREATININE 0.73  CALCIUM 9.0    Liver Function Tests: Recent Labs  Lab 04/15/18 2020  AST 16  ALT 16  ALKPHOS 122  BILITOT 0.5  PROT 7.4  ALBUMIN 4.0   No results for input(s): LIPASE, AMYLASE in the last 168 hours. No results for input(s): AMMONIA in the last 168 hours.  CBC: Recent Labs  Lab 04/15/18 2020  WBC 11.0*  NEUTROABS 8.3*  HGB 13.4  HCT 38.7  MCV 76.2*  PLT 247    Cardiac Enzymes: No results for input(s): CKTOTAL, CKMB, CKMBINDEX, TROPONINI in the last 168 hours.  Lipid Panel: No results for input(s): CHOL, TRIG, HDL, CHOLHDL, VLDL, LDLCALC in the last 168 hours.  CBG: No results for input(s): GLUCAP in the last 168 hours.  Microbiology: Results for orders placed or performed during the hospital encounter of 01/16/17  Surgical PCR screen     Status: None   Collection Time: 01/16/17  3:00 PM  Result Value Ref Range Status   MRSA, PCR NEGATIVE NEGATIVE Final   Staphylococcus aureus NEGATIVE NEGATIVE Final    Comment: (NOTE) The Xpert SA Assay (FDA approved for NASAL specimens in patients 22  years of age and older), is one component of a comprehensive surveillance program. It is not intended to diagnose infection nor to guide or monitor treatment.     Coagulation Studies: No results for input(s): LABPROT, INR in the last 72 hours.  Imaging: No results found.  Medications:  I have reviewed the patient's current medications. Prior to Admission:  Medications Prior  to Admission  Medication Sig Dispense Refill Last Dose  . alendronate (FOSAMAX) 70 MG tablet Take 70 mg by mouth once a week. SUNDAYS   04/14/2018 at Unknown  . baclofen (LIORESAL) 20 MG tablet Take 20 mg by mouth 3 (three) times daily.    04/15/2018 at Unknown  . Cholecalciferol (EQL VITAMIN D3) 25 MCG (1000 UT) tablet Take 2,000 Units by mouth daily.   04/15/2018 at Unknown  . oxybutynin (DITROPAN-XL) 10 MG 24 hr tablet Take 1 tablet (10 mg total) by mouth daily. 90 tablet 3 04/15/2018 at Unknown time  . telmisartan (MICARDIS) 40 MG tablet Take 40 mg by mouth daily.   04/15/2018 at Unknown time  . Calcium Carb-Cholecalciferol (CALCIUM 600+D) 600-800 MG-UNIT TABS Take 1 tablet by mouth daily.   Not Taking at Unknown time  . Multiple Vitamins-Minerals (MULTIVITAMIN WITH MINERALS) tablet Take by mouth.   Not Taking at Unknown time  . oxybutynin (DITROPAN-XL) 5 MG 24 hr tablet   0 Not Taking at Unknown time  . RITUXIMAB IV Pt uses every 6 months due October 2018      Scheduled: . baclofen  20 mg Oral TID  . cholecalciferol  2,000 Units Oral Daily  . enoxaparin (LOVENOX) injection  40 mg Subcutaneous Q24H  . irbesartan  150 mg Oral Daily  . LORazepam  1 mg Intravenous Once  . LORazepam  2 mg Intravenous Once  . oxybutynin  10 mg Oral Daily    Patient seen and examined.  Clinical course and management discussed.  Necessary edits performed.  I agree with the above.  Assessment and plan of care developed and discussed below.     Assessment: 57 y.o female with significant history of localized osteoporosis with with pathological fracture now healed, hypertension, chronic back pain, hereditary and idiopathic neuropathy, and relapsing - remitting multiple sclerosis presenting to the ED with chief complaints of bilateral lower extremity weakness and inability to ambulate.  Etiology likely MS worsening in the setting of infectious process currently being treated with doxycycline.  Patient's currently on  disease modifying drug Rituxan every 7 months.  Patient received methylprednisolone 1 g IV. However holding steroids due to concurrent infection and lack of response to steroids in the past. She reports improvement in her symptoms today compared to yesterday. Pending physical therapy evaluation.   Recommendations: 1. MRI of the brain and lumbar spine with and without contrast pending 2. Agree with antibiotics for underlying infectious process 3. Restart Oxybutinin 4. PT  This patient was staffed with Dr. Verlon Au, Thad Ranger who personally evaluated patient, reviewed documentation and agreed with assessment and plan of care as above.  Webb Silversmith, DNP, FNP-BC Board certified Nurse Practitioner Neurology Department    LOS: 1 day   04/17/2018  10:42 AM  Thana Farr, MD Neurology [phone]  04/17/2018  12:03 PM

## 2018-04-17 NOTE — Progress Notes (Signed)
Pts HR sustaining in the 140s.  MD notified.  ECHO ordered, cardizem ordered once and a cardiology consult entered.  Will continue to monitor.

## 2018-04-17 NOTE — Progress Notes (Signed)
Pts HR has been WDL between the 60s and 80s since administering cardizem.  Will continue to monitor.

## 2018-04-18 DIAGNOSIS — N739 Female pelvic inflammatory disease, unspecified: Secondary | ICD-10-CM

## 2018-04-18 LAB — HIV ANTIBODY (ROUTINE TESTING W REFLEX): HIV Screen 4th Generation wRfx: NONREACTIVE

## 2018-04-18 NOTE — Consult Note (Signed)
Date of Consultation:  04/18/2018  Requesting Physician:  Altamese Dilling, MD  Reason for Consultation:  Left groin abscess  History of Present Illness: Lisa Williams is a 57 y.o. female admitted on 12/9 with MS flare up as well as a left groin abscess which was I&D'd in the ER.  She reports the left groin is feeling better and denies any pain.  She denies any prior episodes of abscess that has required I&D.  She's currently on IV clindamycin.  Denies any fevers, chills.  Past Medical History: Past Medical History:  Diagnosis Date  . Abdominal pain, right upper quadrant   . Back pain   . Calculus of kidney 12/09/2013  . Chronic back pain    unspecified  . Chronic left shoulder pain 07/19/2015  . Functional disorder of bladder    other  . Galactorrhea 11/26/2014   Chronic   . Hereditary and idiopathic neuropathy 08/19/2013  . HPV test positive   . Hypercholesteremia 08/19/2013  . Incomplete bladder emptying   . Microscopic hematuria   . MS (multiple sclerosis) (HCC)   . Muscle spasticity 05/21/2014  . Nonspecific findings on examination of urine    other  . Osteopenia   . Status post laparoscopic supracervical hysterectomy 11/26/2014  . Tobacco user 11/26/2014  . Wrist fracture      Past Surgical History: Past Surgical History:  Procedure Laterality Date  . bilateral tubal ligation  1996  . BREAST CYST EXCISION Left 2002  . KNEE SURGERY     right  . LAPAROSCOPIC SUPRACERVICAL HYSTERECTOMY  08/05/2013  . ORIF WRIST FRACTURE Left 01/17/2017   Procedure: OPEN REDUCTION INTERNAL FIXATION (ORIF) WRIST FRACTURE;  Surgeon: Lyndle Herrlich, MD;  Location: [facility] ORS;  Service: Orthopedics;  Laterality: Left;  . TUBAL LIGATION Bilateral   . VAGINAL HYSTERECTOMY  03/2006    Home Medications: Prior to Admission medications   Medication Sig Start Date End Date Taking? Authorizing Provider  alendronate (FOSAMAX) 70 MG tablet Take 70 mg by mouth once a week. SUNDAYS  02/27/18 02/27/19 Yes [provider]  baclofen (LIORESAL) 20 MG tablet Take 20 mg by mouth 3 (three) times daily.    Yes [provider]  Cholecalciferol (EQL VITAMIN D3) 25 MCG (1000 UT) tablet Take 2,000 Units by mouth daily.   Yes [provider]  oxybutynin (DITROPAN-XL) 10 MG 24 hr tablet Take 1 tablet (10 mg total) by mouth daily. 03/12/18  Yes Vanna Scotland, MD  telmisartan (MICARDIS) 40 MG tablet Take 40 mg by mouth daily. 10/05/17  Yes [provider]  Calcium Carb-Cholecalciferol (CALCIUM 600+D) 600-800 MG-UNIT TABS Take 1 tablet by mouth daily.    [provider]  Multiple Vitamins-Minerals (MULTIVITAMIN WITH MINERALS) tablet Take by mouth.    [provider]  oxybutynin (DITROPAN-XL) 5 MG 24 hr tablet  02/27/18   [provider]  RITUXIMAB IV Pt uses every 6 months due October 2018 09/06/16   [provider]    Allergies: Allergies  Allergen Reactions  . Amlodipine Other (See Comments)    edema  . Povidone Iodine Rash    Social History:  reports that she has quit smoking. Her smoking use included cigarettes. She smoked 0.50 packs per day. She has never used smokeless tobacco. She reports that she does not drink alcohol or use drugs.   Family History: Family History  Problem Relation Age of Onset  . Sickle cell trait Sister   . Hypertension Sister   . Supraventricular  tachycardia Sister   . Hypertension Sister   . Diabetes Maternal Grandmother   . Liver cancer Maternal Grandmother   . Diabetes Maternal Aunt   . Breast cancer Maternal Aunt        great MAT  . Ovarian cancer Paternal Grandmother   . Nephrolithiasis Father   . Hypertension Father   . Prostate cancer Father   . Kidney disease Father   . Heart failure Father   . Hypertension Sister   . Osteoarthritis Mother   . Hypertension Mother   . Hyperlipidemia Mother   . GU problems Neg Hx   . Urolithiasis Neg Hx     Review of  Systems: Review of Systems  Constitutional: Negative for chills and fever.  Respiratory: Negative for shortness of breath.   Cardiovascular: Negative for chest pain.  Gastrointestinal: Negative for abdominal pain.  Skin: Positive for rash (prior abscess, s/p i&D left groin).    Physical Exam BP 119/60 (BP Location: Right Arm)   Pulse 66   Temp 97.9 F (36.6 C) (Oral)   Resp 14   Ht 5\' 11"  (1.803 m)   Wt 111.1 kg   SpO2 96%   BMI 34.17 kg/m  CONSTITUTIONAL: No acute distress HEENT:  Normocephalic, atraumatic, extraocular motion intact. NECK: Trachea is midline, and there is no jugular venous distension. SKIN: Left groin with small I&D site, about 5 mm size.  1/4 inch gauze wick in place.  No erythema or induration.  Shonna Chock removed showing no purulence, healthy wound bed.  Dry gauze applied with tape. PSYCH:  Alert and oriented to person, place and time. Affect is normal.   Assessment and Plan: This is a 57 y.o. female s/p I&D of left groin abscess in ER  --healing well, without any erythema or induration.  No further wick needed and applied dry gauze dressing.  This may be changed once daily and as needed to keep the area clean and dry. --ok to transition to po antibiotics --may follow up with her PCP for wound check in a week.  Face-to-face time spent with the patient and care providers was 40 minutes, with more than 50% of the time spent counseling, educating, and coordinating care of the patient.     Howie Ill, MD [facility] Pg:  [phone]

## 2018-04-18 NOTE — Progress Notes (Signed)
Patient ID: Lisa Williams, female   DOB: Jan 01, 1961, 57 y.o.   MRN: [id number]  [employer] PROGRESS NOTE  Lisa Williams WUJ:[id number] DOB: Aug 07, 1960 DOA: 04/15/2018 PCP: Lauro Regulus, MD  HPI/Subjective: Patient states that she has been walking with physical therapy recently.  She has been having lower extremity weakness.  She also had a large boil in her left groin that was lanced in the emergency room.  Given one dose of steroids, but felt much better when started on IV Abx. Able to walk today with PT.  Objective: Vitals:   04/17/18 1256 04/17/18 1945  BP: (!) 112/54 119/60  Pulse: (!) 55 66  Resp: 18 14  Temp: 97.7 F (36.5 C) 97.9 F (36.6 C)  SpO2: 99% 96%    Filed Weights   04/15/18 1926  Weight: 111.1 kg    ROS: Review of Systems  Constitutional: Negative for chills and fever.  Eyes: Negative for blurred vision.  Respiratory: Negative for cough and shortness of breath.   Cardiovascular: Negative for chest pain.  Gastrointestinal: Positive for abdominal pain. Negative for constipation, diarrhea, nausea and vomiting.  Genitourinary: Negative for dysuria.  Musculoskeletal: Negative for joint pain.  Neurological: Negative for dizziness and headaches.   Exam: Physical Exam  Constitutional: She is oriented to person, place, and time.  HENT:  Nose: No mucosal edema.  Mouth/Throat: No oropharyngeal exudate or posterior oropharyngeal edema.  Eyes: Pupils are equal, round, and reactive to light. Conjunctivae, EOM and lids are normal.  Neck: No JVD present. Carotid bruit is not present. No edema present. No thyroid mass and no thyromegaly present.  Cardiovascular: S1 normal and S2 normal. Exam reveals no gallop.  No murmur heard. Pulses:      Dorsalis pedis pulses are 2+ on the right side and 2+ on the left side.  Respiratory: No respiratory distress. She has no wheezes. She has no rhonchi. She has no rales.  GI: Soft. Bowel sounds are  normal. There is no abdominal tenderness.  Musculoskeletal:     Right ankle: She exhibits swelling.     Left ankle: She exhibits swelling.  Lymphadenopathy:    She has no cervical adenopathy.  Neurological: She is alert and oriented to person, place, and time.  Able to walk with PT today, sitting in chair.  Skin: Skin is warm. Nails show no clubbing.  Boil that was lanced in the left lateral vulvar area.  Slight surrounding erythema.  Nurse present during exam  Psychiatric: She has a normal mood and affect.      Data Reviewed: Basic Metabolic Panel: Recent Labs  Lab 04/15/18 2020 04/17/18 0356  NA 138  --   K 4.2  --   CL 108  --   CO2 26  --   GLUCOSE 114*  --   BUN 22*  --   CREATININE 0.73  --   CALCIUM 9.0  --   MG  --  2.2   Liver Function Tests: Recent Labs  Lab 04/15/18 2020  AST 16  ALT 16  ALKPHOS 122  BILITOT 0.5  PROT 7.4  ALBUMIN 4.0   CBC: Recent Labs  Lab 04/15/18 2020  WBC 11.0*  NEUTROABS 8.3*  HGB 13.4  HCT 38.7  MCV 76.2*  PLT 247     Scheduled Meds: . baclofen  20 mg Oral TID  . cholecalciferol  2,000 Units Oral Daily  . enoxaparin (LOVENOX) injection  40 mg Subcutaneous Q24H  . irbesartan  150 mg  Oral Daily  . oxybutynin  10 mg Oral Daily   Continuous Infusions: . sodium chloride 30 mL (04/17/18 0403)  . clindamycin (CLEOCIN) IV Stopped (04/18/18 0804)    Assessment/Plan:  1. Multiple sclerosis exacerbation.  Received 1 dose of high-dose Solu-Medrol already.  Neurology wanted to hold on further doses of Solu-Medrol until she obtains an MRI of the brain and lumbar spine.  If there is evidence of enhancing lesion would restart steroids if not would hold off on further steroids. 2. Boil left vulvar area.  Change oral doxycycline to IV clindamycin. 3. Essential hypertension on irbesartan 4. Weakness.  Physical therapy evaluation  Code Status:     Code Status Orders  (From admission, onward)         Start     Ordered    04/16/18 0820  Full code  Continuous     04/16/18 0819        Code Status History    Date Active Date Inactive Code Status Order ID Comments User Context   03/05/2017 1906 03/14/2017 2203 Full Code [id number]  Alford Highland, MD ED   01/16/2017 1244 01/18/2017 2127 Full Code [id number]  Altamese Dilling, MD Inpatient   03/24/2016 1213 03/27/2016 2120 Full Code [id number]  Adrian Saran, MD Inpatient   10/02/2015 1959 10/05/2015 1730 Full Code [id number]  Adrian Saran, MD Inpatient   07/14/2015 1605 07/17/2015 2120 Full Code [id number]  Enid Baas, MD Inpatient    Advance Directive Documentation     Most Recent Value  Type of Advance Directive  Living will  Pre-existing out of facility DNR order (yellow form or pink MOST form)  -  "MOST" Form in Place?  -     Family Communication: Sister at the bedside Disposition Plan: To be determined  Consultants:  Neurology  Antibiotics:  Clindamycin  Time spent: 35 minutes  [employer]

## 2018-04-18 NOTE — Progress Notes (Signed)
Subjective: Patient reports some nausea but otherwise doing well.    Objective: Current vital signs: BP 119/60 (BP Location: Right Arm)   Pulse 66   Temp 97.9 F (36.6 C) (Oral)   Resp 14   Ht 5\' 11"  (1.803 m)   Wt 111.1 kg   SpO2 96%   BMI 34.17 kg/m  Vital signs in last 24 hours: Temp:  [97.7 F (36.5 C)-97.9 F (36.6 C)] 97.9 F (36.6 C) (12/11 1945) Pulse Rate:  [55-66] 66 (12/11 1945) Resp:  [14-18] 14 (12/11 1945) BP: (112-119)/(54-60) 119/60 (12/11 1945) SpO2:  [96 %-99 %] 96 % (12/11 1945)  Intake/Output from previous day: 12/11 0701 - 12/12 0700 In: 131.5 [I.V.:31.5; IV Piggyback:100] Out: -  Intake/Output this shift: No intake/output data recorded. Nutritional status:  Diet Order            Diet Heart Room service appropriate? Yes; Fluid consistency: Thin  Diet effective now              Neurologic Exam: Mental Status: Alert, oriented, thought content appropriate. Speech fluent without evidence of aphasia. Able to follow 3 step commands without difficulty. Attention span and concentration seemed appropriate  Cranial Nerves: II: Discs flat bilaterally; Visual fields grossly normal, pupils equal, round, reactive to light and accommodation III,IV, VI: ptosis not present, extra-ocular motions intact bilaterally V,VII: smile symmetric, facial light touch sensationintact VIII: hearing normal bilaterally IX,X: gag reflex present XI: bilateral shoulder shrug XII: midline tongue extension Motor: 5/5 in the BUE's.  Able to lift both lower extremities off the bed.    Lab Results: Basic Metabolic Panel: Recent Labs  Lab 04/15/18 2020 04/17/18 0356  NA 138  --   K 4.2  --   CL 108  --   CO2 26  --   GLUCOSE 114*  --   BUN 22*  --   CREATININE 0.73  --   CALCIUM 9.0  --   MG  --  2.2    Liver Function Tests: Recent Labs  Lab 04/15/18 2020  AST 16  ALT 16  ALKPHOS 122  BILITOT 0.5  PROT 7.4  ALBUMIN 4.0   No results for input(s):  LIPASE, AMYLASE in the last 168 hours. No results for input(s): AMMONIA in the last 168 hours.  CBC: Recent Labs  Lab 04/15/18 2020  WBC 11.0*  NEUTROABS 8.3*  HGB 13.4  HCT 38.7  MCV 76.2*  PLT 247    Cardiac Enzymes: No results for input(s): CKTOTAL, CKMB, CKMBINDEX, TROPONINI in the last 168 hours.  Lipid Panel: No results for input(s): CHOL, TRIG, HDL, CHOLHDL, VLDL, LDLCALC in the last 168 hours.  CBG: No results for input(s): GLUCAP in the last 168 hours.  Microbiology: Results for orders placed or performed during the hospital encounter of 01/16/17  Surgical PCR screen     Status: None   Collection Time: 01/16/17  3:00 PM  Result Value Ref Range Status   MRSA, PCR NEGATIVE NEGATIVE Final   Staphylococcus aureus NEGATIVE NEGATIVE Final    Comment: (NOTE) The Xpert SA Assay (FDA approved for NASAL specimens in patients 58 years of age and older), is one component of a comprehensive surveillance program. It is not intended to diagnose infection nor to guide or monitor treatment.     Coagulation Studies: No results for input(s): LABPROT, INR in the last 72 hours.  Imaging: Mr Laqueta Jean WG Contrast  Result Date: 04/18/2018 CLINICAL DATA:  57 y/o F; increased weakness in  the lower extremities. History of multiple sclerosis. EXAM: MRI HEAD WITHOUT AND WITH CONTRAST TECHNIQUE: Multiplanar, multiecho pulse sequences of the brain and surrounding structures were obtained without and with intravenous contrast. CONTRAST:  10 cc Gadavist COMPARISON:  07/14/2015 CT head. FINDINGS: Brain: Multiple (greater than 20) foci T2 FLAIR hyperintensity are present throughout the supratentorial subcortical and periventricular white matter. Several periventricular lesions have a radial configuration to the ventricles. There is a juxta cortical lesion in the right superior parietal lobe (series 6, image 43). There is increased signal along the under belly of corpus callosum most pronounced in  the genu and splenium. No lesion is identified within the posterior fossa. No reduced diffusion to suggest acute or early subacute infarction. No abnormal susceptibility to indicate intracranial hemorrhage. No mass effect, extra-axial collection, hydrocephalus, or herniation. No abnormal enhancement. Vascular: Normal flow voids. Skull and upper cervical spine: Normal marrow signal. Sinuses/Orbits: Negative. Other: None. IMPRESSION: Multiple white matter hyperintensities including radial periventricular and juxta cortical lesions compatible with history of multiple sclerosis. No enhancement identified to suggest an active process. Electronically Signed   By: Mitzi Hansen M.D.   On: 04/18/2018 00:00   Mr Lumbar Spine W Wo Contrast  Result Date: 04/18/2018 CLINICAL DATA:  57 y/o F; increased weakness in the lower extremities. History of multiple sclerosis. EXAM: MRI LUMBAR SPINE WITHOUT AND WITH CONTRAST TECHNIQUE: Multiplanar and multiecho pulse sequences of the lumbar spine were obtained without and with intravenous contrast. CONTRAST:  10 cc Gadavist COMPARISON:  03/11/2009 CT abdomen and pelvis. FINDINGS: Segmentation:  Standard. Alignment:  Physiologic. Vertebrae:  No fracture, evidence of discitis, or bone lesion. Conus medullaris and cauda equina: Conus extends to the upper L2 level. Conus and cauda equina appear normal. No abnormal enhancement. Paraspinal and other soft tissues: Left kidney interpolar cyst measuring 14 mm. Disc levels: T12-L1: No significant disc displacement, foraminal stenosis, or canal stenosis. L1-2: No significant disc displacement, foraminal stenosis, or canal stenosis. L2-3: No significant disc displacement, foraminal stenosis, or canal stenosis. Mild facet hypertrophy. L3-4: Mild diffuse disc bulge with mild right and moderate left facet hypertrophy. Mild foraminal and canal stenosis. L4-5: Mild diffuse disc bulge with moderate bilateral facet hypertrophy and mild  ligamentum flavum hypertrophy. Moderate bilateral foraminal stenosis. Moderate spinal canal stenosis. L5-S1: Mild disc bulge with moderate facet hypertrophy and left-greater-than-right ligamentum flavum hypertrophy. Mild bilateral foraminal stenosis and mild spinal canal stenosis. IMPRESSION: 1. No acute osseous abnormality or malalignment. 2. Mild-to-moderate lumbar spondylosis predominantly at L3-S1. 3. Mild L3-4, moderate L4-5, and mild L5-S1 foraminal and spinal canal stenosis. Electronically Signed   By: Mitzi Hansen M.D.   On: 04/18/2018 00:10    Medications:  I have reviewed the patient's current medications. Prior to Admission:  Medications Prior to Admission  Medication Sig Dispense Refill Last Dose  . alendronate (FOSAMAX) 70 MG tablet Take 70 mg by mouth once a week. SUNDAYS   04/14/2018 at Unknown  . baclofen (LIORESAL) 20 MG tablet Take 20 mg by mouth 3 (three) times daily.    04/15/2018 at Unknown  . Cholecalciferol (EQL VITAMIN D3) 25 MCG (1000 UT) tablet Take 2,000 Units by mouth daily.   04/15/2018 at Unknown  . oxybutynin (DITROPAN-XL) 10 MG 24 hr tablet Take 1 tablet (10 mg total) by mouth daily. 90 tablet 3 04/15/2018 at Unknown time  . telmisartan (MICARDIS) 40 MG tablet Take 40 mg by mouth daily.   04/15/2018 at Unknown time  . Calcium Carb-Cholecalciferol (CALCIUM 600+D) 600-800 MG-UNIT TABS  Take 1 tablet by mouth daily.   Not Taking at Unknown time  . Multiple Vitamins-Minerals (MULTIVITAMIN WITH MINERALS) tablet Take by mouth.   Not Taking at Unknown time  . oxybutynin (DITROPAN-XL) 5 MG 24 hr tablet   0 Not Taking at Unknown time  . RITUXIMAB IV Pt uses every 6 months due October 2018      Scheduled: . baclofen  20 mg Oral TID  . cholecalciferol  2,000 Units Oral Daily  . enoxaparin (LOVENOX) injection  40 mg Subcutaneous Q24H  . irbesartan  150 mg Oral Daily  . oxybutynin  10 mg Oral Daily   Assessment: 57 year old female with a history of MS presenting  with LE weakness and infection.  Patient started on antibiotics.  MRI of the brain performed and shows evidence of active disease.  MRI of the lumbar spine shows no evidence of MS plaques.  Worsening likely secondary to infection.    Recommendations: 1.  No indication for steroids 2.  Continue treatment of infection 3.  Continue therapy   LOS: 2 days    04/18/2018  9:03 AM

## 2018-04-18 NOTE — Progress Notes (Signed)
Patient ID: Lisa Williams, female   DOB: 23-Apr-1961, 57 y.o.   MRN: [id number]  [facility] PROGRESS NOTE  Lisa Williams XBJ:[id number] DOB: [birth year] DOA: 04/15/2018 PCP: Lauro Regulus, MD  HPI/Subjective: Patient states that she has been walking with physical therapy recently.  She has been having lower extremity weakness.  She also had a large boil in her left groin that was lanced in the emergency room.  Given one dose of steroids, but felt much better when started on IV Abx. Able to walk today with PT. She had nausea today.  Objective: Vitals:   04/17/18 1945 04/18/18 1343  BP: 119/60 (!) 157/90  Pulse: 66 76  Resp: 14 20  Temp: 97.9 F (36.6 C) (!) 97.4 F (36.3 C)  SpO2: 96% 96%    Filed Weights   04/15/18 1926  Weight: 111.1 kg    ROS: Review of Systems  Constitutional: Negative for chills and fever.  Eyes: Negative for blurred vision.  Respiratory: Negative for cough and shortness of breath.   Cardiovascular: Negative for chest pain.  Gastrointestinal: Positive for abdominal pain. Negative for constipation, diarrhea, nausea and vomiting.  Genitourinary: Negative for dysuria.  Musculoskeletal: Negative for joint pain.  Neurological: Negative for dizziness and headaches.   Exam: Physical Exam  Constitutional: She is oriented to person, place, and time.  HENT:  Nose: No mucosal edema.  Mouth/Throat: No oropharyngeal exudate or posterior oropharyngeal edema.  Eyes: Pupils are equal, round, and reactive to light. Conjunctivae, EOM and lids are normal.  Neck: No JVD present. Carotid bruit is not present. No edema present. No thyroid mass and no thyromegaly present.  Cardiovascular: S1 normal and S2 normal. Exam reveals no gallop.  No murmur heard. Pulses:      Dorsalis pedis pulses are 2+ on the right side and 2+ on the left side.  Respiratory: No respiratory distress. She has no wheezes. She has no rhonchi. She has no rales.  GI:  Soft. Bowel sounds are normal. There is no abdominal tenderness.  Musculoskeletal:     Right ankle: She exhibits swelling.     Left ankle: She exhibits swelling.  Lymphadenopathy:    She has no cervical adenopathy.  Neurological: She is alert and oriented to person, place, and time.  Able to walk with PT today, sitting in chair.  Skin: Skin is warm. Nails show no clubbing.  Boil that was lanced in the left lateral vulvar area.  Slight surrounding erythema.  Nurse present during exam  Psychiatric: She has a normal mood and affect.      Data Reviewed: Basic Metabolic Panel: Recent Labs  Lab 04/15/18 2020 04/17/18 0356  NA 138  --   K 4.2  --   CL 108  --   CO2 26  --   GLUCOSE 114*  --   BUN 22*  --   CREATININE 0.73  --   CALCIUM 9.0  --   MG  --  2.2   Liver Function Tests: Recent Labs  Lab 04/15/18 2020  AST 16  ALT 16  ALKPHOS 122  BILITOT 0.5  PROT 7.4  ALBUMIN 4.0   CBC: Recent Labs  Lab 04/15/18 2020  WBC 11.0*  NEUTROABS 8.3*  HGB 13.4  HCT 38.7  MCV 76.2*  PLT 247     Scheduled Meds: . baclofen  20 mg Oral TID  . cholecalciferol  2,000 Units Oral Daily  . enoxaparin (LOVENOX) injection  40 mg Subcutaneous Q24H  .  irbesartan  150 mg Oral Daily  . oxybutynin  10 mg Oral Daily   Continuous Infusions: . sodium chloride 30 mL (04/17/18 0403)  . clindamycin (CLEOCIN) IV Stopped (04/18/18 1259)    Assessment/Plan:  1. Generalized weakness- ruled out Multiple sclerosis exacerbation.  Received 1 dose of high-dose Solu-Medrol already.  Neurology wanted to hold on further doses of Solu-Medrol until she obtains an MRI of the brain and lumbar spine.  Ruled out active disease, Neuro suggest no need for steroids.     Pt's weakness is due to infection, it is improving with IV abx use. 2. Boil left groin area.  Change oral doxycycline to IV clindamycin. Called surgical consult to check, suggest- well healing and cont Abx. 3. Essential hypertension on  irbesartan 4. Weakness.  Physical therapy evaluation- pt was able to walk. 5. Nausea- supportive care.  Code Status:     Code Status Orders  (From admission, onward)         Start     Ordered   04/16/18 0820  Full code  Continuous     04/16/18 0819        Code Status History    Date Active Date Inactive Code Status Order ID Comments User Context   03/05/2017 1906 03/14/2017 2203 Full Code [id number]  Alford Highland, MD ED   01/16/2017 1244 01/18/2017 2127 Full Code [id number]  Altamese Dilling, MD Inpatient   03/24/2016 1213 03/27/2016 2120 Full Code [id number]  Adrian Saran, MD Inpatient   10/02/2015 1959 10/05/2015 1730 Full Code [id number]  Adrian Saran, MD Inpatient   07/14/2015 1605 07/17/2015 2120 Full Code [id number]  Enid Baas, MD Inpatient    Advance Directive Documentation     Most Recent Value  Type of Advance Directive  Living will  Pre-existing out of facility DNR order (yellow form or pink MOST form)  -  "MOST" Form in Place?  -     Family Communication: Sister at the bedside Disposition Plan: To be determined  Consultants:  Neurology  Antibiotics:  Clindamycin  Time spent: 35 minutes  [employer]

## 2018-04-18 NOTE — Plan of Care (Signed)

## 2018-04-19 LAB — BASIC METABOLIC PANEL
Anion gap: 6 (ref 5–15)
BUN: 20 mg/dL (ref 6–20)
CO2: 25 mmol/L (ref 22–32)
Calcium: 8.3 mg/dL — ABNORMAL LOW (ref 8.9–10.3)
Chloride: 109 mmol/L (ref 98–111)
Creatinine, Ser: 0.64 mg/dL (ref 0.44–1.00)
GFR calc Af Amer: >60 mL/min (ref >60)
GFR calc non Af Amer: >60 mL/min (ref >60)
Glucose, Bld: 97 mg/dL (ref 70–99)
Potassium: 3.8 mmol/L (ref 3.5–5.1)
Sodium: 140 mmol/L (ref 135–145)

## 2018-04-19 LAB — CBC
HCT: 36.9 % (ref 36.0–46.0)
Hemoglobin: 12.7 g/dL (ref 12.0–15.0)
MCH: 26.4 pg (ref 26.0–34.0)
MCHC: 34.4 g/dL (ref 30.0–36.0)
MCV: 76.7 fL — ABNORMAL LOW (ref 80.0–100.0)
PLATELETS: 259 10*3/uL (ref 150–400)
RBC: 4.81 MIL/uL (ref 3.87–5.11)
RDW: 13.9 % (ref 11.5–15.5)
WBC: 8 10*3/uL (ref 4.0–10.5)
nRBC: 0 % (ref 0.0–0.2)

## 2018-04-19 MED ORDER — CEPHALEXIN 500 MG PO CAPS: 500.0000 mg | ORAL_CAPSULE | Freq: Four times a day (QID) | ORAL | 0 refills | Status: AC

## 2018-04-19 MED ORDER — CEPHALEXIN 500 MG PO CAPS
500.0000 mg | ORAL_CAPSULE | Freq: Four times a day (QID) | ORAL | Status: DC
  Administered 2018-04-19 (×2): 500 mg via ORAL
  Filled 2018-04-19 (×2): qty 1

## 2018-04-19 NOTE — Care Management Important Message (Signed)
Important Message  Patient Details  Name: Lisa Williams MRN: [id number] Date of Birth: 1961-02-13   Medicare Important Message Given:  Yes    Olegario Messier A Maela Takeda 04/19/2018, 3:31 PM

## 2018-04-19 NOTE — Progress Notes (Signed)
Physical Therapy Treatment Patient Details Name: Lisa Williams MRN: [id number] DOB: 1961/03/02 Today's Date: 04/19/2018    History of Present Illness Pt is 57 y.o female with significant history of localized osteoporosis with with pathological fracture now healed, hypertension, chronic back pain, hereditary and idiopathic neuropathy, and relapsing - remitting multiple sclerosis presented to the ED with chief complaints of bilateral lower extremity weakness and inability to ambulate. new onset Afib with RVR during stay, PT spoke with cardiology prior to session for consent.     PT Comments    Patient agreeable to PT at start of session, no complaints of pain. Pt able to mobilize to EOB mod I. Pt dressed and AFOs and shoes donned with minAx1 prior to mobility. Sit <> stand x4 during session, less effort from PT and pt, CGA-minAx1. Patient ambulated a total of 36ft with CGA and RW, 1 seated rest break at 56ft. Pt with difficulty with L foot clearance, wide BOS, decreased gait speed. Heavy use of UE. Occasional verbal cues for posture and breathing technique. Patient up in chair with all needs in reach at end of session. The patient would benefit from further skilled PT intervention to continue to maximize mobility, safety, and independence. Current recommendation remains appropriate.      Follow Up Recommendations  Home health PT;Supervision for mobility/OOB     Equipment Recommendations  None recommended by PT    Recommendations for Other Services       Precautions / Restrictions Precautions Precautions: Fall Required Braces or Orthoses: Other Brace Other Brace: bilateral AFOs Restrictions Weight Bearing Restrictions: No    Mobility  Bed Mobility Overal bed mobility: Modified Independent             General bed mobility comments: HOB elevated  Transfers Overall transfer level: Needs assistance Equipment used: Rolling walker (2 wheeled) Transfers: Sit to/from  Stand Sit to Stand: Min guard;Min assist         General transfer comment: Pt performed x4 this session. Improved quality of movement and decreased effort from pt to achieve standing position. (elevated chair surface with pillows)  Ambulation/Gait Ambulation/Gait assistance: Min guard Gait Distance (Feet): 30 Feet Assistive device: Rolling walker (2 wheeled)       General Gait Details: 1 seated rest break. Decreased gait velocity, difficulty with L foot clearance. Pt needed verbal cues for deep breathing technique during ambulation. Leg braces and shoes in place   Stairs             Wheelchair Mobility    Modified Rankin (Stroke Patients Only)       Balance Overall balance assessment: Mild deficits observed, not formally tested                                          Cognition Arousal/Alertness: Awake/alert Behavior During Therapy: WFL for tasks assessed/performed Overall Cognitive Status: Within Functional Limits for tasks assessed                                        Exercises      General Comments        Pertinent Vitals/Pain Pain Assessment: No/denies pain    Home Living  Prior Function            PT Goals (current goals can now be found in the care plan section) Progress towards PT goals: Progressing toward goals    Frequency    Min 2X/week      PT Plan Current plan remains appropriate    Co-evaluation              AM-PAC PT "6 Clicks" Mobility   Outcome Measure  Help needed turning from your back to your side while in a flat bed without using bedrails?: A Little Help needed moving from lying on your back to sitting on the side of a flat bed without using bedrails?: A Little Help needed moving to and from a bed to a chair (including a wheelchair)?: A Little Help needed standing up from a chair using your arms (e.g., wheelchair or bedside chair)?: A Little Help  needed to walk in hospital room?: A Little Help needed climbing 3-5 steps with a railing? : A Lot 6 Click Score: 17    End of Session Equipment Utilized During Treatment: Gait belt Activity Tolerance: Patient tolerated treatment well Patient left: with chair alarm set;in chair;with family/visitor present;with call bell/phone within reach Nurse Communication: Mobility status PT Visit Diagnosis: Unsteadiness on feet (R26.81);Difficulty in walking, not elsewhere classified (R26.2);Muscle weakness (generalized) (M62.81);History of falling (Z91.81)     Time: 2241-1464 PT Time Calculation (min) (ACUTE ONLY): 34 min  Charges:  $Therapeutic Activity: 23-37 mins                    Olga Coaster PT, DPT 3:30 PM,04/19/18 [phone]

## 2018-04-19 NOTE — Progress Notes (Addendum)
Subjective: Patient continues to make improvement with PT assist. She report that nausea is much better and now she is able to tolerate po intake.  Objective: Current vital signs: BP (!) 118/55 (BP Location: Right Arm)   Pulse 66   Temp 97.7 F (36.5 C) (Oral)   Resp 16   Ht 5\' 11"  (1.803 m)   Wt 111.1 kg   SpO2 100%   BMI 34.17 kg/m  Vital signs in last 24 hours: Temp:  [97.4 F (36.3 C)-98.2 F (36.8 C)] 97.7 F (36.5 C) (12/13 0504) Pulse Rate:  [60-76] 66 (12/13 0504) Resp:  [16-20] 16 (12/13 0504) BP: (118-157)/(55-90) 118/55 (12/13 0504) SpO2:  [96 %-100 %] 100 % (12/13 0504)  Intake/Output from previous day: 12/12 0701 - 12/13 0700 In: 240 [P.O.:240] Out: 350 [Urine:250; Emesis/NG output:100] Intake/Output this shift: No intake/output data recorded. Nutritional status:  Diet Order            Diet Heart Room service appropriate? Yes; Fluid consistency: Thin  Diet effective now             Neurologic Exam: Mental Status: Alert, oriented, thought content appropriate. Speech fluent without evidence of aphasia. Able to follow 3 step commands without difficulty. Attention span and concentration seemed appropriate  Cranial Nerves: II: Discs flat bilaterally; Visual fields grossly normal, pupils equal, round, reactive to light and accommodation III,IV, VI: ptosis not present, extra-ocular motions intact bilaterally V,VII: smile symmetric, facial light touch sensationintact VIII: hearing normal bilaterally IX,X: gag reflex present XI: bilateral shoulder shrug XII: midline tongue extension Motor: 5/5 in the BUE's.  Able to lift both lower extremities off the bed.    Lab Results: Basic Metabolic Panel: Recent Labs  Lab 04/15/18 2020 04/17/18 0356 04/19/18 0303  NA 138  --  140  K 4.2  --  3.8  CL 108  --  109  CO2 26  --  25  GLUCOSE 114*  --  97  BUN 22*  --  20  CREATININE 0.73  --  0.64  CALCIUM 9.0  --  8.3*  MG  --  2.2  --     Liver  Function Tests: Recent Labs  Lab 04/15/18 2020  AST 16  ALT 16  ALKPHOS 122  BILITOT 0.5  PROT 7.4  ALBUMIN 4.0   No results for input(s): LIPASE, AMYLASE in the last 168 hours. No results for input(s): AMMONIA in the last 168 hours.  CBC: Recent Labs  Lab 04/15/18 2020 04/19/18 0303  WBC 11.0* 8.0  NEUTROABS 8.3*  --   HGB 13.4 12.7  HCT 38.7 36.9  MCV 76.2* 76.7*  PLT 247 259    Cardiac Enzymes: No results for input(s): CKTOTAL, CKMB, CKMBINDEX, TROPONINI in the last 168 hours.  Lipid Panel: No results for input(s): CHOL, TRIG, HDL, CHOLHDL, VLDL, LDLCALC in the last 168 hours.  CBG: No results for input(s): GLUCAP in the last 168 hours.  Microbiology: Results for orders placed or performed during the hospital encounter of 01/16/17  Surgical PCR screen     Status: None   Collection Time: 01/16/17  3:00 PM  Result Value Ref Range Status   MRSA, PCR NEGATIVE NEGATIVE Final   Staphylococcus aureus NEGATIVE NEGATIVE Final    Comment: (NOTE) The Xpert SA Assay (FDA approved for NASAL specimens in patients 80 years of age and older), is one component of a comprehensive surveillance program. It is not intended to diagnose infection nor to guide or monitor treatment.  Coagulation Studies: No results for input(s): LABPROT, INR in the last 72 hours.  Imaging: Mr Laqueta Jean WG Contrast  Result Date: 04/18/2018 CLINICAL DATA:  57 y/o F; increased weakness in the lower extremities. History of multiple sclerosis. EXAM: MRI HEAD WITHOUT AND WITH CONTRAST TECHNIQUE: Multiplanar, multiecho pulse sequences of the brain and surrounding structures were obtained without and with intravenous contrast. CONTRAST:  10 cc Gadavist COMPARISON:  07/14/2015 CT head. FINDINGS: Brain: Multiple (greater than 20) foci T2 FLAIR hyperintensity are present throughout the supratentorial subcortical and periventricular white matter. Several periventricular lesions have a radial configuration to  the ventricles. There is a juxta cortical lesion in the right superior parietal lobe (series 6, image 43). There is increased signal along the under belly of corpus callosum most pronounced in the genu and splenium. No lesion is identified within the posterior fossa. No reduced diffusion to suggest acute or early subacute infarction. No abnormal susceptibility to indicate intracranial hemorrhage. No mass effect, extra-axial collection, hydrocephalus, or herniation. No abnormal enhancement. Vascular: Normal flow voids. Skull and upper cervical spine: Normal marrow signal. Sinuses/Orbits: Negative. Other: None. IMPRESSION: Multiple white matter hyperintensities including radial periventricular and juxta cortical lesions compatible with history of multiple sclerosis. No enhancement identified to suggest an active process. Electronically Signed   By: Mitzi Hansen M.D.   On: 04/18/2018 00:00   Mr Lumbar Spine W Wo Contrast  Result Date: 04/18/2018 CLINICAL DATA:  57 y/o F; increased weakness in the lower extremities. History of multiple sclerosis. EXAM: MRI LUMBAR SPINE WITHOUT AND WITH CONTRAST TECHNIQUE: Multiplanar and multiecho pulse sequences of the lumbar spine were obtained without and with intravenous contrast. CONTRAST:  10 cc Gadavist COMPARISON:  03/11/2009 CT abdomen and pelvis. FINDINGS: Segmentation:  Standard. Alignment:  Physiologic. Vertebrae:  No fracture, evidence of discitis, or bone lesion. Conus medullaris and cauda equina: Conus extends to the upper L2 level. Conus and cauda equina appear normal. No abnormal enhancement. Paraspinal and other soft tissues: Left kidney interpolar cyst measuring 14 mm. Disc levels: T12-L1: No significant disc displacement, foraminal stenosis, or canal stenosis. L1-2: No significant disc displacement, foraminal stenosis, or canal stenosis. L2-3: No significant disc displacement, foraminal stenosis, or canal stenosis. Mild facet hypertrophy. L3-4: Mild  diffuse disc bulge with mild right and moderate left facet hypertrophy. Mild foraminal and canal stenosis. L4-5: Mild diffuse disc bulge with moderate bilateral facet hypertrophy and mild ligamentum flavum hypertrophy. Moderate bilateral foraminal stenosis. Moderate spinal canal stenosis. L5-S1: Mild disc bulge with moderate facet hypertrophy and left-greater-than-right ligamentum flavum hypertrophy. Mild bilateral foraminal stenosis and mild spinal canal stenosis. IMPRESSION: 1. No acute osseous abnormality or malalignment. 2. Mild-to-moderate lumbar spondylosis predominantly at L3-S1. 3. Mild L3-4, moderate L4-5, and mild L5-S1 foraminal and spinal canal stenosis. Electronically Signed   By: Mitzi Hansen M.D.   On: 04/18/2018 00:10    Medications:  I have reviewed the patient's current medications. Prior to Admission:  Medications Prior to Admission  Medication Sig Dispense Refill Last Dose  . alendronate (FOSAMAX) 70 MG tablet Take 70 mg by mouth once a week. SUNDAYS   04/14/2018 at Unknown  . baclofen (LIORESAL) 20 MG tablet Take 20 mg by mouth 3 (three) times daily.    04/15/2018 at Unknown  . Cholecalciferol (EQL VITAMIN D3) 25 MCG (1000 UT) tablet Take 2,000 Units by mouth daily.   04/15/2018 at Unknown  . oxybutynin (DITROPAN-XL) 10 MG 24 hr tablet Take 1 tablet (10 mg total) by mouth daily. 90 tablet  3 04/15/2018 at Unknown time  . telmisartan (MICARDIS) 40 MG tablet Take 40 mg by mouth daily.   04/15/2018 at Unknown time  . Calcium Carb-Cholecalciferol (CALCIUM 600+D) 600-800 MG-UNIT TABS Take 1 tablet by mouth daily.   Not Taking at Unknown time  . Multiple Vitamins-Minerals (MULTIVITAMIN WITH MINERALS) tablet Take by mouth.   Not Taking at Unknown time  . oxybutynin (DITROPAN-XL) 5 MG 24 hr tablet   0 Not Taking at Unknown time  . RITUXIMAB IV Pt uses every 6 months due October 2018      Scheduled: . baclofen  20 mg Oral TID  . cholecalciferol  2,000 Units Oral Daily  .  enoxaparin (LOVENOX) injection  40 mg Subcutaneous Q24H  . irbesartan  150 mg Oral Daily  . oxybutynin  10 mg Oral Daily   Patient seen and examined.  Clinical course and management discussed.  Necessary edits performed.  I agree with the above.  Assessment and plan of care developed and discussed below.   Assessment: 57 y.o femalewith significant history of localized osteoporosis with with pathological fracture now healed, hypertension, chronic back pain, hereditary and idiopathic neuropathy, and relapsing-remitting multiple sclerosis presenting complaints of bilateral lower extremity weakness and inability to ambulate.Etiology likely MS worseningin the setting of infectious process currently being treated with doxycycline. Patient's currently on disease modifying drug Rituxan every 7 months. No active lesions on MRI brain and lumbar spine.  Recommendations: 1. Agree with continuing antibioticsfor underlying infectious process 2. Continue with PT  3. Follow up with outpatient Neurology as scheduled  This patient was staffed with Dr. Verlon Au, Thad Ranger who personally evaluated patient, reviewed documentation and agreed with assessment and plan of care as above.  Webb Silversmith, DNP, FNP-BC Board certified Nurse Practitioner Neurology Department   LOS: 3 days   04/19/2018  9:05 AM  Thana Farr, MD Neurology [phone]  04/19/2018  12:33 PM

## 2018-04-19 NOTE — Care Management (Signed)
Physical therapy evaluation completed. Recommending home health PT; supervision for mobility. Home Health Compare list query completed for zip code [id number].  Discussed agencies with Ms. Cerveny at the bedside. Chose [facility]. Will update Mellissa Kohut, [facility] representative . Cope of query given to Ms. Stepien. Copy placed on chart. Ms Magnan requested transportation per [employer]. Gwenette Greet RN MSN CCM Care MAnagement [phone]

## 2018-04-19 NOTE — Progress Notes (Signed)
Pt being discharged home, discharge instructions and prescription reviewed with pt, states understanding, pt with no complaints 

## 2018-04-19 NOTE — Discharge Summary (Signed)
[facility] - [place] at [facility]   PATIENT NAME: Lisa Williams    MR#:  [id number]  DATE OF BIRTH:  12/30/1960  DATE OF ADMISSION:  04/15/2018 ADMITTING PHYSICIAN: Barbaraann Rondo, MD  DATE OF DISCHARGE: 04/19/2018   PRIMARY CARE PHYSICIAN: Lauro Regulus, MD    ADMISSION DIAGNOSIS:  Abscess of female pelvis [N73.9] Multiple sclerosis exacerbation (HCC) [G35]  DISCHARGE DIAGNOSIS:  Active Problems:   Multiple sclerosis exacerbation (HCC)   SVT (supraventricular tachycardia) (HCC)   Abscess of female pelvis  SECONDARY DIAGNOSIS:   Past Medical History:  Diagnosis Date  . Abdominal pain, right upper quadrant   . Back pain   . Calculus of kidney 12/09/2013  . Chronic back pain    unspecified  . Chronic left shoulder pain 07/19/2015  . Functional disorder of bladder    other  . Galactorrhea 11/26/2014   Chronic   . Hereditary and idiopathic neuropathy 08/19/2013  . HPV test positive   . Hypercholesteremia 08/19/2013  . Incomplete bladder emptying   . Microscopic hematuria   . MS (multiple sclerosis) (HCC)   . Muscle spasticity 05/21/2014  . Nonspecific findings on examination of urine    other  . Osteopenia   . Status post laparoscopic supracervical hysterectomy 11/26/2014  . Tobacco user 11/26/2014  . Wrist fracture     HOSPITAL COURSE:   1. Generalized weakness- ruled out Multiple sclerosis exacerbation. Due to infection.      Received 1 dose of high-dose Solu-Medrol already.  Neurology wanted to hold on further doses of Solu-Medrol until she obtains an MRI of the brain and lumbar spine.  Ruled out active disease, Neuro suggest no need for steroids.     Pt's weakness is due to infection, it is improving with IV abx use.  She was able to walk with physical therapy with support. 2. Boil left groin area.  Change oral doxycycline to IV clindamycin. Called surgical consult to check, suggest- well healing and cont Abx.  Give oral  antibiotic on discharge for 3-4 more days. 3. Essential hypertension on irbesartan 4. Weakness.  Physical therapy evaluation- pt was able to walk.  Will arrange for home health on discharge. 5. Nausea- supportive care.  Much improved now.  Possibly it was due to antibiotic use.  DISCHARGE CONDITIONS:   Stable  CONSULTS OBTAINED:  Treatment Team:  Barbaraann Rondo, MD Kym Groom, MD  DRUG ALLERGIES:   Allergies  Allergen Reactions  . Amlodipine Other (See Comments)    edema  . Povidone Iodine Rash    DISCHARGE MEDICATIONS:   Allergies as of 04/19/2018      Reactions   Amlodipine Other (See Comments)   edema   Povidone Iodine Rash      Medication List    STOP taking these medications   RITUXIMAB IV     TAKE these medications   alendronate 70 MG tablet Commonly known as:  FOSAMAX Take 70 mg by mouth once a week. SUNDAYS   baclofen 20 MG tablet Commonly known as:  LIORESAL Take 20 mg by mouth 3 (three) times daily.   CALCIUM 600+D 600-800 MG-UNIT Tabs Generic drug:  Calcium Carb-Cholecalciferol Take 1 tablet by mouth daily.   cephALEXin 500 MG capsule Commonly known as:  KEFLEX Take 1 capsule (500 mg total) by mouth every 6 (six) hours for 4 days.   EQL VITAMIN D3 25 MCG (1000 UT) tablet Generic drug:  Cholecalciferol Take 2,000 Units by mouth daily.   multivitamin  with minerals tablet Take by mouth.   oxybutynin 10 MG 24 hr tablet Commonly known as:  DITROPAN-XL Take 1 tablet (10 mg total) by mouth daily. What changed:  Another medication with the same name was removed. Continue taking this medication, and follow the directions you see here.   telmisartan 40 MG tablet Commonly known as:  MICARDIS Take 40 mg by mouth daily.        DISCHARGE INSTRUCTIONS:   Follow-up with neurologist in 1 to 2 weeks.  If you experience worsening of your admission symptoms, develop shortness of breath, life threatening emergency, suicidal or homicidal  thoughts you must seek medical attention immediately by calling 911 or calling your MD immediately  if symptoms less severe.  You Must read complete instructions/literature along with all the possible adverse reactions/side effects for all the Medicines you take and that have been prescribed to you. Take any new Medicines after you have completely understood and accept all the possible adverse reactions/side effects.   Please note  You were cared for by a hospitalist during your hospital stay. If you have any questions about your discharge medications or the care you received while you were in the hospital after you are discharged, you can call the unit and asked to speak with the hospitalist on call if the hospitalist that took care of you is not available. Once you are discharged, your primary care physician will handle any further medical issues. Please note that NO REFILLS for any discharge medications will be authorized once you are discharged, as it is imperative that you return to your primary care physician (or establish a relationship with a primary care physician if you do not have one) for your aftercare needs so that they can reassess your need for medications and monitor your lab values.    Today   CHIEF COMPLAINT:  No chief complaint on file.   HISTORY OF PRESENT ILLNESS:  Lisa Williams  is a 57 y.o. female with a known history of chronic relapsing/remitting MS (last flare 02/2017; on Rituxan) p/w B/L LE weakness. Pt states her Neurologist is Dr. Theora Master [facility]). She states she had been feeling well, and has been coming to the hospital for P/T 2x/wk. She states that she fell on Sunday (12/08). She was in her bedroom giving herself a sponge bath, and both of her legs suddenly got weak and "went out" from underneath her. She states she called EMS, who helped her up. She denied injury/pain, and was able to ambulate without difficulty thereafter. She felt well for the  rest of the day. On Monday (12/09), she states she was again feeling well. She drove herself to and from P/T, and states she had no issues during the session. The rest of her day was relatively uneventful up until ~1800PM, at which time pt states she was in the bathroom, and after toileting, noticed she was unable to stand up due to B/L LE weakness. She denies pain, tingling or numbness. She refused MRI.  Also reportedly has mons pubis abscess, s/p I&D in ED.  VITAL SIGNS:  Blood pressure 125/65, pulse 69, temperature 98.2 F (36.8 C), temperature source Oral, resp. rate 18, height 5\' 11"  (1.803 m), weight 111.1 kg, SpO2 97 %.  I/O:    Intake/Output Summary (Last 24 hours) at 04/19/2018 1756 Last data filed at 04/19/2018 0503 Gross per 24 hour  Intake -  Output 150 ml  Net -150 ml    PHYSICAL EXAMINATION:   Constitutional:  She is oriented to person, place, and time.  HENT:  Nose: No mucosal edema.  Mouth/Throat: No oropharyngeal exudate or posterior oropharyngeal edema.  Eyes: Pupils are equal, round, and reactive to light. Conjunctivae, EOM and lids are normal.  Neck: No JVD present. Carotid bruit is not present. No edema present. No thyroid mass and no thyromegaly present.  Cardiovascular: S1 normal and S2 normal. Exam reveals no gallop.  No murmur heard. Pulses:      Dorsalis pedis pulses are 2+ on the right side and 2+ on the left side.  Respiratory: No respiratory distress. She has no wheezes. She has no rhonchi. She has no rales.  GI: Soft. Bowel sounds are normal. There is no abdominal tenderness.  Musculoskeletal:     Right ankle: She exhibits swelling.     Left ankle: She exhibits swelling.  Lymphadenopathy:    She has no cervical adenopathy.  Neurological: She is alert and oriented to person, place, and time.  Able to walk with PT today, sitting in chair.  Skin: Skin is warm. Nails show no clubbing.  Boil that was lanced in the left lateral inguinal/ labial area.   no surrounding erythema.  Nurse present during exam  Psychiatric: She has a normal mood and affect.   DATA REVIEW:   CBC Recent Labs  Lab 04/19/18 0303  WBC 8.0  HGB 12.7  HCT 36.9  PLT 259    Chemistries  Recent Labs  Lab 04/15/18 2020 04/17/18 0356 04/19/18 0303  NA 138  --  140  K 4.2  --  3.8  CL 108  --  109  CO2 26  --  25  GLUCOSE 114*  --  97  BUN 22*  --  20  CREATININE 0.73  --  0.64  CALCIUM 9.0  --  8.3*  MG  --  2.2  --   AST 16  --   --   ALT 16  --   --   ALKPHOS 122  --   --   BILITOT 0.5  --   --     Cardiac Enzymes No results for input(s): TROPONINI in the last 168 hours.  Microbiology Results  Results for orders placed or performed during the hospital encounter of 01/16/17  Surgical PCR screen     Status: None   Collection Time: 01/16/17  3:00 PM  Result Value Ref Range Status   MRSA, PCR NEGATIVE NEGATIVE Final   Staphylococcus aureus NEGATIVE NEGATIVE Final    Comment: (NOTE) The Xpert SA Assay (FDA approved for NASAL specimens in patients 102 years of age and older), is one component of a comprehensive surveillance program. It is not intended to diagnose infection nor to guide or monitor treatment.     RADIOLOGY:  Mr Laqueta Jean Wo Contrast  Result Date: 04/18/2018 CLINICAL DATA:  57 y/o F; increased weakness in the lower extremities. History of multiple sclerosis. EXAM: MRI HEAD WITHOUT AND WITH CONTRAST TECHNIQUE: Multiplanar, multiecho pulse sequences of the brain and surrounding structures were obtained without and with intravenous contrast. CONTRAST:  10 cc Gadavist COMPARISON:  07/14/2015 CT head. FINDINGS: Brain: Multiple (greater than 20) foci T2 FLAIR hyperintensity are present throughout the supratentorial subcortical and periventricular white matter. Several periventricular lesions have a radial configuration to the ventricles. There is a juxta cortical lesion in the right superior parietal lobe (series 6, image 43). There is  increased signal along the under belly of corpus callosum most pronounced in the genu and  splenium. No lesion is identified within the posterior fossa. No reduced diffusion to suggest acute or early subacute infarction. No abnormal susceptibility to indicate intracranial hemorrhage. No mass effect, extra-axial collection, hydrocephalus, or herniation. No abnormal enhancement. Vascular: Normal flow voids. Skull and upper cervical spine: Normal marrow signal. Sinuses/Orbits: Negative. Other: None. IMPRESSION: Multiple white matter hyperintensities including radial periventricular and juxta cortical lesions compatible with history of multiple sclerosis. No enhancement identified to suggest an active process. Electronically Signed   By: Mitzi Hansen M.D.   On: 04/18/2018 00:00   Mr Lumbar Spine W Wo Contrast  Result Date: 04/18/2018 CLINICAL DATA:  57 y/o F; increased weakness in the lower extremities. History of multiple sclerosis. EXAM: MRI LUMBAR SPINE WITHOUT AND WITH CONTRAST TECHNIQUE: Multiplanar and multiecho pulse sequences of the lumbar spine were obtained without and with intravenous contrast. CONTRAST:  10 cc Gadavist COMPARISON:  03/11/2009 CT abdomen and pelvis. FINDINGS: Segmentation:  Standard. Alignment:  Physiologic. Vertebrae:  No fracture, evidence of discitis, or bone lesion. Conus medullaris and cauda equina: Conus extends to the upper L2 level. Conus and cauda equina appear normal. No abnormal enhancement. Paraspinal and other soft tissues: Left kidney interpolar cyst measuring 14 mm. Disc levels: T12-L1: No significant disc displacement, foraminal stenosis, or canal stenosis. L1-2: No significant disc displacement, foraminal stenosis, or canal stenosis. L2-3: No significant disc displacement, foraminal stenosis, or canal stenosis. Mild facet hypertrophy. L3-4: Mild diffuse disc bulge with mild right and moderate left facet hypertrophy. Mild foraminal and canal stenosis. L4-5:  Mild diffuse disc bulge with moderate bilateral facet hypertrophy and mild ligamentum flavum hypertrophy. Moderate bilateral foraminal stenosis. Moderate spinal canal stenosis. L5-S1: Mild disc bulge with moderate facet hypertrophy and left-greater-than-right ligamentum flavum hypertrophy. Mild bilateral foraminal stenosis and mild spinal canal stenosis. IMPRESSION: 1. No acute osseous abnormality or malalignment. 2. Mild-to-moderate lumbar spondylosis predominantly at L3-S1. 3. Mild L3-4, moderate L4-5, and mild L5-S1 foraminal and spinal canal stenosis. Electronically Signed   By: Mitzi Hansen M.D.   On: 04/18/2018 00:10    EKG:   Orders placed or performed during the hospital encounter of 04/15/18  . EKG 12-Lead  . EKG 12-Lead    Management plans discussed with the patient, family and they are in agreement.  CODE STATUS:     Code Status Orders  (From admission, onward)         Start     Ordered   04/16/18 0820  Full code  Continuous     04/16/18 0819        Code Status History    Date Active Date Inactive Code Status Order ID Comments User Context   03/05/2017 1906 03/14/2017 2203 Full Code [id number]  Alford Highland, MD ED   01/16/2017 1244 01/18/2017 2127 Full Code [id number]  Altamese Dilling, MD Inpatient   03/24/2016 1213 03/27/2016 2120 Full Code [id number]  Adrian Saran, MD Inpatient   10/02/2015 1959 10/05/2015 1730 Full Code [id number]  Adrian Saran, MD Inpatient   07/14/2015 1605 07/17/2015 2120 Full Code [id number]  Enid Baas, MD Inpatient    Advance Directive Documentation     Most Recent Value  Type of Advance Directive  Living will  Pre-existing out of facility DNR order (yellow form or pink MOST form)  -  "MOST" Form in Place?  -      TOTAL TIME TAKING CARE OF THIS PATIENT: 35 minutes.    Altamese Dilling M.D on 04/19/2018 at 5:56 PM  Between 7am to 6pm - Pager -  [phone]  After 6pm go to [email or web address] - Air traffic controller  [facility]  Office  [phone]  CC: Primary care physician; Lauro Regulus, MD   Note: This dictation was prepared with Dragon dictation along with smaller phrase technology. Any transcriptional errors that result from this process are unintentional.

## 2018-04-22 ENCOUNTER — Ambulatory Visit: Payer: PPO

## 2018-04-24 ENCOUNTER — Ambulatory Visit: Payer: PPO

## 2018-04-28 DIAGNOSIS — G35 Multiple sclerosis: Secondary | ICD-10-CM | POA: Diagnosis not present

## 2018-04-29 ENCOUNTER — Ambulatory Visit: Payer: PPO

## 2018-05-02 ENCOUNTER — Ambulatory Visit: Payer: PPO

## 2018-05-02 ENCOUNTER — Other Ambulatory Visit: Payer: Self-pay | Admitting: Pharmacist

## 2018-05-02 NOTE — Patient Outreach (Addendum)
[facility] Care Management  [facility]   05/02/2018  Lisa Williams October 30, 1960 [id number]  Reason for referral: Medication Reconciliation Post Discharge  Current insurance:[employer]  PMHx: includes but is not limited to multiple scleosis, hx wrist fracture, neurogenic bladder, HTN  Outreach:  Successful telephone call with patient.  HIPAA identifiers verified.   Subjective:  [facility] for weakness associated with a multiple sclerosis exacerbation and a pelvic abscess. She was treated with antibiotic therapy and discharged with 4 more days of PO antibiotics for the abscess.   Today, she notes that she is doing well since discharge and denies any concerns related to medications or her medical conditions. She notes that she is periodically checking her blood pressure and seeing numbers "similar to in the hospital", noting systolics of 120-130s and diastolics of 60-80s.   Upon medication review, she notes that she is not taking a calcium supplement, just a vitamin D supplement.   Objective: Lab Results  Component Value Date   CREATININE 0.64 04/19/2018   CREATININE 0.73 04/15/2018   CREATININE 0.67 03/12/2017    Lab Results  Component Value Date   HGBA1C 5.1 11/29/2016    Lipid Panel     Component Value Date/Time   CHOL 187 11/29/2016 0000   TRIG 64 11/29/2016 0000   HDL 55 11/29/2016 0000   CHOLHDL 3.4 11/29/2016 0000   LDLCALC 119 (H) 11/29/2016 0000    BP Readings from Last 3 Encounters:  04/19/18 125/65  03/12/18 (!) 163/80  01/22/18 (!) 155/78    Allergies  Allergen Reactions  . Amlodipine Other (See Comments)    edema  . Povidone Iodine Rash    Medications Reviewed Today    Reviewed by Lourena Simmondsravis,  E, [facility] (Pharmacist) on 05/02/18 at 1209  Med List Status: <None>  Medication Order Taking? Sig Documenting Provider Last Dose Status Informant  alendronate (FOSAMAX) 70  MG tablet [id number] Yes Take 70 mg by mouth once a week. SUNDAYS [provider] Taking Active Self  baclofen (LIORESAL) 20 MG tablet [id number] Yes Take 20 mg by mouth 3 (three) times daily.  [provider] Taking Active Self           Med Note Jola Babinski(PIERZCHALA, ROSE Geryl CouncilmanM   Mon Mar 19, 2017 11:51 AM) Pt takes 3 times a day         Discontinued 05/02/18 1209 (Completed Course)   Cholecalciferol (EQL VITAMIN D3) 25 MCG (1000 UT) tablet [id number] Yes Take 2,000 Units by mouth daily. [provider] Taking Active Self        Discontinued 05/02/18 1208 (Completed Course)   oxybutynin (DITROPAN-XL) 10 MG 24 hr tablet [id number] Yes Take 1 tablet (10 mg total) by mouth daily. Vanna ScotlandBrandon, Ashley, MD Taking Active Self  telmisartan (MICARDIS) 40 MG tablet [id number] Yes Take 40 mg by mouth daily. [provider] Taking Active Self          Assessment:  Date Discharged from Hospital: 04/19/2018 Date Medication Reconciliation Performed: 05/02/2018  Medications:  New at Discharge: . Cephalexin (completed course)  Patient was recently discharged from hospital and all medications have been reviewed.  Drugs sorted by system:  Cardiovascular: telmisartan  Endocrine: alendronate  Pain: baclofen  Genitourinary: oxybutynin  Vitamins/Minerals/Supplements: Vitamin D  Medication Review Findings:  . Discussed appropriate blood pressures; encouraged patient to continue to monitor and bring results to future appointments  Plan: - Will route note to patient's PCP for notification -  Patient has my contact information for any future questions or concerns    Catie Feliz Beam, PharmD PGY2 Ambulatory Care Pharmacy Resident, [facility] Phone: [phone]

## 2018-05-06 ENCOUNTER — Ambulatory Visit: Payer: PPO

## 2018-05-09 ENCOUNTER — Ambulatory Visit: Payer: PPO

## 2018-05-09 ENCOUNTER — Other Ambulatory Visit: Payer: Self-pay

## 2018-05-09 ENCOUNTER — Encounter: Payer: Self-pay | Admitting: Physical Therapy

## 2018-05-09 ENCOUNTER — Ambulatory Visit: Payer: Medicare HMO | Attending: Neurology | Admitting: Physical Therapy

## 2018-05-09 DIAGNOSIS — R2689 Other abnormalities of gait and mobility: Secondary | ICD-10-CM | POA: Diagnosis present

## 2018-05-09 DIAGNOSIS — R2681 Unsteadiness on feet: Secondary | ICD-10-CM

## 2018-05-09 DIAGNOSIS — M5412 Radiculopathy, cervical region: Secondary | ICD-10-CM | POA: Insufficient documentation

## 2018-05-09 DIAGNOSIS — M6281 Muscle weakness (generalized): Secondary | ICD-10-CM | POA: Insufficient documentation

## 2018-05-10 NOTE — Therapy (Signed)
[place] MAIN [facility] SERVICES [place], [place], [address] Phone: [phone]   Fax:  [phone]  Physical Therapy Re-Evaluation  Patient Details  Name: Lisa Williams MRN: [id number] Date of Birth: 1961/02/11 Referring Provider (PT): Gurney Maxin, MD   Encounter Date: 05/09/2018  PT End of Session - 05/09/18 1400    Visit Number  79    Number of Visits  70    Date for PT Re-Evaluation  07/04/18    Authorization Type   1/10 starting 05/09/2018    PT Start Time  1302    PT Stop Time  1345    PT Time Calculation (min)  43 min    Equipment Utilized During Treatment  Gait belt    Activity Tolerance  Patient limited by fatigue;Patient tolerated treatment well    Behavior During Therapy  [facility] for tasks assessed/performed       Past Medical History:  Diagnosis Date  . Abdominal pain, right upper quadrant   . Back pain   . Calculus of kidney 12/09/2013  . Chronic back pain    unspecified  . Chronic left shoulder pain 07/19/2015  . Functional disorder of bladder    other  . Galactorrhea 11/26/2014   Chronic   . Hereditary and idiopathic neuropathy 08/19/2013  . HPV test positive   . Hypercholesteremia 08/19/2013  . Incomplete bladder emptying   . Microscopic hematuria   . MS (multiple sclerosis) ([place])   . Muscle spasticity 05/21/2014  . Nonspecific findings on examination of urine    other  . Osteopenia   . Status post laparoscopic supracervical hysterectomy 11/26/2014  . Tobacco user 11/26/2014  . Wrist fracture     Past Surgical History:  Procedure Laterality Date  . bilateral tubal ligation  1996  . BREAST CYST EXCISION Left 2002  . KNEE SURGERY     right  . LAPAROSCOPIC SUPRACERVICAL HYSTERECTOMY  08/05/2013  . ORIF WRIST FRACTURE Left 01/17/2017   Procedure: OPEN REDUCTION INTERNAL FIXATION (ORIF) WRIST FRACTURE;  Surgeon: Lovell Sheehan, MD;  Location: [facility] ORS;  Service: Orthopedics;  Laterality: Left;  .  TUBAL LIGATION Bilateral   . VAGINAL HYSTERECTOMY  03/2006    There were no vitals filed for this visit.   Subjective Assessment - 05/09/18 1306    Subjective  Patient states that she feels she is getting better. She continues to want to work on standing tolerance, outdoor level walking, and step ups for home mobility. Patient states that everything is medically stable and clear since she was discharged form the hospital.    Patient is accompained by:  Family member    Pertinent History  Pt is a 58 y/o F who presents with BLE weakness and difficulty ambulating due to h/o MS that was diagnosed in 1995. Pt denies any pain associated. Pt was admitted to the hospital in November 2018 due to MS exacerbation. Nov-Dec to HHPT-->[facility] until end of Jan. After rehab the pt was able to ambulate up to 100 ft with the RW. Pt has Bil AFO, her therapist at [facility] thinks she needs a new AFO as her feet still drag when she ambulates, pt reports HHPT mentioned a spring AFO. Pt has custom AFOs that were originally made 4 years ago. Goals: improve balance, to be able to get into the shower with her tub bench without assist, to be able to get up 2 steps at sister's home with railings on both sides but too  far apart to reach both sides, to be able to drive adapted car. Pt is staying at other sister's house with one step to get inside and currently picking up L leg with UEs and then stepping in. Pt steps down leading with LLE. Pt wants to work on walking side to side as this is challenging and she had not yet progressed to this at rehab. Pt able to get in/out the car with assist only to bring BLEs into the car. Hoping to drive by June or July with adapted car (already has one). Pt needs assist bringing LEs into and out of hospital bed. Pt able to perform sit>stand without assist from [facility] with a pillow on the seat for a boost. Pt denies any falls in the past 6 months. Pt has been increasing her time alone at home up to  a few hours at a time to become more independent. Pt has both manual and power WC. Uses power WC at home, uses manual WC out in community. Pt able to self propel manual WC. Pt needs assist putting pants around ankles but she can pull them up. Pt can put on her shoes but needs help getting her leg up to do so. Pt able to tie her shoelaces on her own. Pt has a tub/shower unit at home with a tub bench. No grab bars in the shower. Has hand held shower head. Pt currently taking a sponge bath. Pt has a regular height toilet with a BSC over top. Pt with h/o L wrist fx with no precautions, pt wore her soft brace to evaluation for comfort but reports her wrist has healed and is doing well following OT.     Limitations  Lifting;Standing;Walking;House hold activities    How long can you sit comfortably?  n/a    How long can you stand comfortably?  3 minutes without UE assist, limited by fatigue    How long can you walk comfortably?  100 ft, limited by fatigue    Patient Stated Goals  see above    Currently in Pain?  No/denies    Pain Score  0-No pain    Pain Onset  Today    Multiple Pain Sites  No       Objective measurements completed on examination: See below findings in goals assessment.    PT Education - 05/09/18 1400    Education provided  Yes    Education Details  progress toward goals, POC    Person(s) Educated  Patient    Methods  Explanation    Comprehension  Verbalized understanding       PT Short Term Goals - 04/11/18 1156      PT SHORT TERM GOAL #1   Title  Pt will be completed HEP with min assist from caregiver at least 4 days/wk for improved carryover between sessions    Baseline  HEP compliant     Time  2    Period  Weeks    Status  Achieved      PT SHORT TERM GOAL #2   Title  Patient will report a worst pain of 6/10 on VAS in cervical spine to improve tolerance with ADLs and reduced symptoms with activities.     Baseline  4/30: 9/10 5/21: 0/10 pain    Time  2    Period   Weeks    Status  Achieved      PT SHORT TERM GOAL #3   Title  Patient will perform cervical  rotation at least 50 degrees, flexion and extension to 70 degrees, and sidebending to 40 degrees without pain    Baseline  4/30: R 50 L 34 Rotation, R 26 L 35 sb, Extension 35 flex 35 ; 5/21: extension 46, SB R 41 L 40, rotation R 62 L 61,     Time  2    Period  Weeks    Status  Achieved        PT Long Term Goals - 05/09/18 0001      PT LONG TERM GOAL #2   Title  Pt's 43mT will improve to at least 0.5 m/s for improved safety ambulating in home    Baseline  0.14 m/s; 5/15: .231m  7/10: .36 m/s  8/20: .42 m/s  9/26: 0.2636m10/14: .26 m/s improved hip and knee clearance 11/15: 0.74m44mmproved hip and knee clearance; 12/5: .29 m/s improved step length and forward moment: decreased foot drag. 05/09/2018: 0.21 m/s    Time  8    Period  Weeks    Status  Partially Met    Target Date  07/04/18      PT LONG TERM GOAL #3   Title  Pt's Bil hip F strength will improve to at least 2+/5 BLEs for improved gait mechanics and safety    Baseline  L: 2/5, R: 1/5 7/10: 2/5 bilaterally  8/20: 2/5 bilat 9/26: 2/5 bilat 10/14: 2/5 bilaterally ,improved hip flexor muscle activation 2/5: improved hip flexor and quad activation: 12/5: 2/5 ; 05/09/2018: 2/5    Time  8    Period  Weeks    Status  Partially Met    Target Date  07/04/18      PT LONG TERM GOAL #4   Title  Pt's ABC Scale will improve to at least 40% to demonstrate improved balance    Baseline  28.13% 5/15; 24.4% 7/10: 23.4% 8/20: 22. 2% 9/26: 25.3% 10/14: 26.5% 11/15: 24.38% 12/5: 26%; 05/09/2018: 29%    Time  6    Period  Weeks    Status  Partially Met    Target Date  07/04/18      PT LONG TERM GOAL #6   Title  Pt will be able to perform tub bench into/out of shower transfer without assist for improved independence with this task    Baseline  challenged with steps at this time, getting better, stands and gives sponge bath independently now  9/26: has  not tried transfer yet 11/15: has not attempted yet; 05/09/2018: not attempted    Time  6    Period  Weeks    Status  Partially Met    Target Date  07/04/18      PT LONG TERM GOAL  #12   TITLE  Patient will negotiate one step ~4 inches to enter house independently to increase functional independence in natural environment.     Baseline  8/20: unable to do 9/26: patient too fatigued/not feeling well to perform 10/14: unable to perform at this time 11/15: unable to perform at this time; 12/5: able to lift 1 inch; 05/09/2018: unable to clear 4" step, able to lift foot ~ 1in    Time  8    Period  Weeks    Status  On-going    Target Date  07/04/18      PT LONG TERM GOAL  #13   TITLE  Patient will perform STS from standard height surface to walker to allow patient to participate and transition in  public areas when not in wheelchair.     Baseline  10/14: able to perform in W/C which is raised slightly 11/15: 20inch height 12/5: 18 inches from plinth table; 05/09/2018: 18 inches from plinth table    Time  8    Period  Weeks    Status  Partially Met    Target Date  07/04/18      PT LONG TERM GOAL  #14   TITLE  Patient will stand 5 minutes to increase standing duration to allow patient to return to washing dishes and clothes.     Baseline  12/5: 56 seconds; 05/09/2018: 1 min 18 sec    Time  8    Period  Weeks    Status  On-going    Target Date  07/04/18      PT LONG TERM GOAL  #15   TITLE  Patient will negotiate 30 ft of changing surfaces outside such as pavement and parking lot to allow patient to walk into church.     Baseline  12/5: does not walk outside    Time  8    Period  Weeks    Status  On-going    Target Date  07/04/18             Plan - 05/09/18 1400    Clinical Impression Statement  Patient is a lively and motivated 58 year old female with a diagnosis of MS presenting to clinic s/p recent hospitalization for infection and resultant MS flare. Despite recent hospitalization,  patient has not had severe losses in progress toward previous goals as evidenced by her current gait speed (0.21 m/s) during the 10MWT. Patient has maintained her previous strength gains in bilateral hip flexion (2/5), but continues to have deficits in hip flexion which limit her ability to navigate unlevel surfaces and steps. Patient's standing tolerance is currently ~1 min with BUE support and SBA. Patient will benefit from skilled therapeutic intervention to address deficits in strength, mobility, balance, and function to improve safety and overall QOL.    History and Personal Factors relevant to plan of care:  (+) motivation, family support, past progress with PT; (-) dx of MS    Clinical Presentation  Evolving    Clinical Presentation due to:  recent hospitalization for infection and MS flare leading to a decrease in activity from previous participation in outpatient PT    Clinical Decision Making  Moderate    Rehab Potential  Good    PT Frequency  2x / week    PT Duration  8 weeks    PT Treatment/Interventions  ADLs/Self Care Home Management;Aquatic Therapy;Biofeedback;Cryotherapy;Electrical Stimulation;Iontophoresis 56m/ml Dexamethasone;Traction;Moist Heat;Ultrasound;DME Instruction;Gait training;Stair training;Functional mobility training;Therapeutic activities;Therapeutic exercise;Balance training;Neuromuscular re-education;Patient/family education;Orthotic Fit/Training;Wheelchair mobility training;Manual techniques;Compression bandaging;Passive range of motion;Dry needling;Energy conservation;Taping;Splinting    PT Next Visit Plan  continue strengthening/balance    PT Home Exercise Plan  to initiate next session (please incorporate hip flexor and adductor stretches in HEP if possible    Consulted and Agree with Plan of Care  Patient       Patient will benefit from skilled therapeutic intervention in order to improve the following deficits and impairments:  Abnormal gait, Decreased activity  tolerance, Decreased balance, Decreased coordination, Decreased endurance, Decreased knowledge of use of DME, Decreased mobility, Decreased range of motion, Decreased safety awareness, Decreased strength, Difficulty walking, Impaired perceived functional ability, Impaired sensation, Impaired tone, Impaired UE functional use, Improper body mechanics, Postural dysfunction  Visit Diagnosis: Muscle weakness (generalized)  Unsteadiness  on feet  Other abnormalities of gait and mobility  Radiculopathy, cervical region     Problem List Patient Active Problem List   Diagnosis Date Noted  . Abscess of female pelvis   . SVT (supraventricular tachycardia) ([place])   . Radial styloid tenosynovitis 03/12/2018  . Wheelchair confinement 02/27/2018  . Localized osteoporosis with current pathological fracture with routine healing 01/19/2017  . Wrist fracture 01/16/2017  . Sprain of ankle 03/23/2016  . Closed fracture of lateral malleolus 03/16/2016  . Health care maintenance 01/24/2016  . Blood pressure elevated without history of HTN 10/25/2015  . Essential hypertension 10/25/2015  . Multiple sclerosis ([place]) 10/02/2015  . Chronic left shoulder pain 07/19/2015  . Multiple sclerosis exacerbation ([place]) 07/14/2015  . MS (multiple sclerosis) ([place]) 11/26/2014  . Increased body mass index 11/26/2014  . HPV test positive 11/26/2014  . Status post laparoscopic supracervical hysterectomy 11/26/2014  . Galactorrhea 11/26/2014  . Back ache 05/21/2014  . Adiposity 05/21/2014  . Disordered sleep 05/21/2014  . Muscle spasticity 05/21/2014  . Spasticity 05/21/2014  . Calculus of kidney 12/09/2013  . Renal colic 54/65/0354  . Hypercholesteremia 08/19/2013  . Hereditary and idiopathic neuropathy 08/19/2013  . Hypercholesterolemia without hypertriglyceridemia 08/19/2013  . Bladder infection, chronic 07/25/2012  . Disorder of bladder function 07/25/2012  . Incomplete bladder emptying 07/25/2012  .  Microscopic hematuria 07/25/2012  . Right upper quadrant pain 07/25/2012   Myles Gip PT, DPT [phone] 05/10/2018, 9:15 AM  [place] MAIN [facility] SERVICES [address] [place], [place], [address] Phone: [phone]   Fax:  [phone]  Name: Lisa Williams MRN: [id number] Date of Birth: [birth year]

## 2018-05-14 ENCOUNTER — Ambulatory Visit: Payer: Medicare HMO

## 2018-05-14 DIAGNOSIS — M6281 Muscle weakness (generalized): Secondary | ICD-10-CM | POA: Diagnosis not present

## 2018-05-14 DIAGNOSIS — R2689 Other abnormalities of gait and mobility: Secondary | ICD-10-CM

## 2018-05-14 DIAGNOSIS — R2681 Unsteadiness on feet: Secondary | ICD-10-CM

## 2018-05-14 NOTE — Therapy (Signed)
[place] MAIN [facility] SERVICES [address] [place], [place], [address] Phone: [phone]   Fax:  [phone]  Physical Therapy Treatment  Patient Details  Name: Lisa Williams MRN: [id number] Date of Birth: [birth year] Referring Provider (PT): Gurney Maxin, MD   Encounter Date: 05/14/2018  PT End of Session - 05/14/18 1315    Visit Number  65    Number of Visits  63    Date for PT Re-Evaluation  07/04/18    Authorization Type   2/10 starting 05/09/2018    PT Start Time  1115    PT Stop Time  1201    PT Time Calculation (min)  46 min    Equipment Utilized During Treatment  Gait belt    Activity Tolerance  Patient limited by fatigue;Patient tolerated treatment well    Behavior During Therapy  [facility] for tasks assessed/performed       Past Medical History:  Diagnosis Date  . Abdominal pain, right upper quadrant   . Back pain   . Calculus of kidney 12/09/2013  . Chronic back pain    unspecified  . Chronic left shoulder pain 07/19/2015  . Functional disorder of bladder    other  . Galactorrhea 11/26/2014   Chronic   . Hereditary and idiopathic neuropathy 08/19/2013  . HPV test positive   . Hypercholesteremia 08/19/2013  . Incomplete bladder emptying   . Microscopic hematuria   . MS (multiple sclerosis) ([place])   . Muscle spasticity 05/21/2014  . Nonspecific findings on examination of urine    other  . Osteopenia   . Status post laparoscopic supracervical hysterectomy 11/26/2014  . Tobacco user 11/26/2014  . Wrist fracture     Past Surgical History:  Procedure Laterality Date  . bilateral tubal ligation  1996  . BREAST CYST EXCISION Left 2002  . KNEE SURGERY     right  . LAPAROSCOPIC SUPRACERVICAL HYSTERECTOMY  08/05/2013  . ORIF WRIST FRACTURE Left 01/17/2017   Procedure: OPEN REDUCTION INTERNAL FIXATION (ORIF) WRIST FRACTURE;  Surgeon: Lovell Sheehan, MD;  Location: [facility] ORS;  Service: Orthopedics;  Laterality: Left;  .  TUBAL LIGATION Bilateral   . VAGINAL HYSTERECTOMY  03/2006    There were no vitals filed for this visit.  Subjective Assessment - 05/14/18 1307    Subjective  Patient states her R back is hurting today since she woke up. Has been compliant with her HEP.     Patient is accompained by:  Family member    Pertinent History  Pt is a 58 y/o F who presents with BLE weakness and difficulty ambulating due to h/o MS that was diagnosed in 1995. Pt denies any pain associated. Pt was admitted to the hospital in November 2018 due to MS exacerbation. Nov-Dec to HHPT-->[facility] until end of Jan. After rehab the pt was able to ambulate up to 100 ft with the RW. Pt has Bil AFO, her therapist at [facility] thinks she needs a new AFO as her feet still drag when she ambulates, pt reports HHPT mentioned a spring AFO. Pt has custom AFOs that were originally made 4 years ago. Goals: improve balance, to be able to get into the shower with her tub bench without assist, to be able to get up 2 steps at sister's home with railings on both sides but too far apart to reach both sides, to be able to drive adapted car. Pt is staying at other sister's house with one step to get  inside and currently picking up L leg with UEs and then stepping in. Pt steps down leading with LLE. Pt wants to work on walking side to side as this is challenging and she had not yet progressed to this at rehab. Pt able to get in/out the car with assist only to bring BLEs into the car. Hoping to drive by June or July with adapted car (already has one). Pt needs assist bringing LEs into and out of hospital bed. Pt able to perform sit>stand without assist from [facility] with a pillow on the seat for a boost. Pt denies any falls in the past 6 months. Pt has been increasing her time alone at home up to a few hours at a time to become more independent. Pt has both manual and power WC. Uses power WC at home, uses manual WC out in community. Pt able to self propel manual  WC. Pt needs assist putting pants around ankles but she can pull them up. Pt can put on her shoes but needs help getting her leg up to do so. Pt able to tie her shoelaces on her own. Pt has a tub/shower unit at home with a tub bench. No grab bars in the shower. Has hand held shower head. Pt currently taking a sponge bath. Pt has a regular height toilet with a BSC over top. Pt with h/o L wrist fx with no precautions, pt wore her soft brace to evaluation for comfort but reports her wrist has healed and is doing well following OT.     Limitations  Lifting;Standing;Walking;House hold activities    How long can you sit comfortably?  n/a    How long can you stand comfortably?  3 minutes without UE assist, limited by fatigue    How long can you walk comfortably?  100 ft, limited by fatigue    Patient Stated Goals  see above    Currently in Pain?  Yes    Pain Score  8     Pain Location  Back    Pain Orientation  Right;Lower    Pain Descriptors / Indicators  Aching    Pain Type  Acute pain    Pain Onset  Today    Pain Frequency  Intermittent    Aggravating Factors   walking    Pain Relieving Factors  laying down       Supine with wedge-to reduce low back pain   LE rotation for low back pain reduction 2 minutes  Single knee to chest 2x 60 seconds with PT assist  Hamstring stretch 2x 60 seconds with LLE tighter than R.   Posterior Pelvic tilts: 15x 3 second holds  Sidelying: Rollout and STM to right thoracolumbar paraspinals x 4 minutes for reduction of pain   Supine with wedge and bolster:  Short arc quads: 15x each LE  Adductor squeezes 12x 3 second holds  Abductor clamshells 12x  Gluteal squeezes: 12x 5 second holds  TrA activation 10x 3 second holds  Stand pivot transfer with three steps: x 2 trials, CGA with RW.  Rolling in bed: independent Supine to Sit : Min-Mod A for LE's   Seated:  Hip flexion isometrics 10x 2 second holds  Seated stretches for R thoracolumbar: opp direction  forward diagonal lean 2x 20 seconds                        PT Education - 05/14/18 1314    Education provided  Yes  Education Details  exercise technique, back pain reduction     Person(s) Educated  Patient    Methods  Explanation;Demonstration;Tactile cues;Verbal cues;Handout    Comprehension  Verbalized understanding;Returned demonstration;Verbal cues required;Tactile cues required;Need further instruction       PT Short Term Goals - 04/11/18 1156      PT SHORT TERM GOAL #1   Title  Pt will be completed HEP with min assist from caregiver at least 4 days/wk for improved carryover between sessions    Baseline  HEP compliant     Time  2    Period  Weeks    Status  Achieved      PT SHORT TERM GOAL #2   Title  Patient will report a worst pain of 6/10 on VAS in cervical spine to improve tolerance with ADLs and reduced symptoms with activities.     Baseline  4/30: 9/10 5/21: 0/10 pain    Time  2    Period  Weeks    Status  Achieved      PT SHORT TERM GOAL #3   Title  Patient will perform cervical rotation at least 50 degrees, flexion and extension to 70 degrees, and sidebending to 40 degrees without pain    Baseline  4/30: R 50 L 34 Rotation, R 26 L 35 sb, Extension 35 flex 35 ; 5/21: extension 46, SB R 41 L 40, rotation R 62 L 61,     Time  2    Period  Weeks    Status  Achieved        PT Long Term Goals - 05/09/18 0001      PT LONG TERM GOAL #2   Title  Pt's 42mT will improve to at least 0.5 m/s for improved safety ambulating in home    Baseline  0.14 m/s; 5/15: .223m  7/10: .36 m/s  8/20: .42 m/s  9/26: 0.2639m10/14: .26 m/s improved hip and knee clearance 11/15: 0.25m9mmproved hip and knee clearance; 12/5: .29 m/s improved step length and forward moment: decreased foot drag. 05/09/2018: 0.21 m/s    Time  8    Period  Weeks    Status  Partially Met    Target Date  07/04/18      PT LONG TERM GOAL #3   Title  Pt's Bil hip F strength will improve  to at least 2+/5 BLEs for improved gait mechanics and safety    Baseline  L: 2/5, R: 1/5 7/10: 2/5 bilaterally  8/20: 2/5 bilat 9/26: 2/5 bilat 10/14: 2/5 bilaterally ,improved hip flexor muscle activation 2/5: improved hip flexor and quad activation: 12/5: 2/5 ; 05/09/2018: 2/5    Time  8    Period  Weeks    Status  Partially Met    Target Date  07/04/18      PT LONG TERM GOAL #4   Title  Pt's ABC Scale will improve to at least 40% to demonstrate improved balance    Baseline  28.13% 5/15; 24.4% 7/10: 23.4% 8/20: 22. 2% 9/26: 25.3% 10/14: 26.5% 11/15: 24.38% 12/5: 26%; 05/09/2018: 29%    Time  6    Period  Weeks    Status  Partially Met    Target Date  07/04/18      PT LONG TERM GOAL #6   Title  Pt will be able to perform tub bench into/out of shower transfer without assist for improved independence with this task    Baseline  challenged with steps  at this time, getting better, stands and gives sponge bath independently now  9/26: has not tried transfer yet 11/15: has not attempted yet; 05/09/2018: not attempted    Time  6    Period  Weeks    Status  Partially Met    Target Date  07/04/18      PT LONG TERM GOAL  #12   TITLE  Patient will negotiate one step ~4 inches to enter house independently to increase functional independence in natural environment.     Baseline  8/20: unable to do 9/26: patient too fatigued/not feeling well to perform 10/14: unable to perform at this time 11/15: unable to perform at this time; 12/5: able to lift 1 inch; 05/09/2018: unable to clear 4" step, able to lift foot ~ 1in    Time  8    Period  Weeks    Status  On-going    Target Date  07/04/18      PT LONG TERM GOAL  #13   TITLE  Patient will perform STS from standard height surface to walker to allow patient to participate and transition in public areas when not in wheelchair.     Baseline  10/14: able to perform in W/C which is raised slightly 11/15: 20inch height 12/5: 18 inches from plinth table; 05/09/2018:  18 inches from plinth table    Time  8    Period  Weeks    Status  Partially Met    Target Date  07/04/18      PT LONG TERM GOAL  #14   TITLE  Patient will stand 5 minutes to increase standing duration to allow patient to return to washing dishes and clothes.     Baseline  12/5: 56 seconds; 05/09/2018: 1 min 18 sec    Time  8    Period  Weeks    Status  On-going    Target Date  07/04/18      PT LONG TERM GOAL  #15   TITLE  Patient will negotiate 30 ft of changing surfaces outside such as pavement and parking lot to allow patient to walk into church.     Baseline  12/5: does not walk outside    Time  8    Period  Weeks    Status  On-going    Target Date  07/04/18            Plan - 05/14/18 1316    Clinical Impression Statement  Patient presented to physical therapy with pain that was reduced from 8/10 to 2/10 through manual and therex interventions. Patient continues to progress with functional strength, demonstrating ability to roll independently in bed. Patient has good motivation for continued progression. Patient will benefit from skilled therapeutic intervention to address deficits in strength, mobility, balance, and function to improve safety and overall QOL.    Rehab Potential  Good    PT Frequency  2x / week    PT Duration  8 weeks    PT Treatment/Interventions  ADLs/Self Care Home Management;Aquatic Therapy;Biofeedback;Cryotherapy;Electrical Stimulation;Iontophoresis 11m/ml Dexamethasone;Traction;Moist Heat;Ultrasound;DME Instruction;Gait training;Stair training;Functional mobility training;Therapeutic activities;Therapeutic exercise;Balance training;Neuromuscular re-education;Patient/family education;Orthotic Fit/Training;Wheelchair mobility training;Manual techniques;Compression bandaging;Passive range of motion;Dry needling;Energy conservation;Taping;Splinting    PT Next Visit Plan  continue strengthening/balance    PT Home Exercise Plan  to initiate next session (please  incorporate hip flexor and adductor stretches in HEP if possible    Consulted and Agree with Plan of Care  Patient       Patient  will benefit from skilled therapeutic intervention in order to improve the following deficits and impairments:  Abnormal gait, Decreased activity tolerance, Decreased balance, Decreased coordination, Decreased endurance, Decreased knowledge of use of DME, Decreased mobility, Decreased range of motion, Decreased safety awareness, Decreased strength, Difficulty walking, Impaired perceived functional ability, Impaired sensation, Impaired tone, Impaired UE functional use, Improper body mechanics, Postural dysfunction  Visit Diagnosis: Muscle weakness (generalized)  Unsteadiness on feet  Other abnormalities of gait and mobility     Problem List Patient Active Problem List   Diagnosis Date Noted  . Abscess of female pelvis   . SVT (supraventricular tachycardia) ([place])   . Radial styloid tenosynovitis 03/12/2018  . Wheelchair confinement 02/27/2018  . Localized osteoporosis with current pathological fracture with routine healing 01/19/2017  . Wrist fracture 01/16/2017  . Sprain of ankle 03/23/2016  . Closed fracture of lateral malleolus 03/16/2016  . Health care maintenance 01/24/2016  . Blood pressure elevated without history of HTN 10/25/2015  . Essential hypertension 10/25/2015  . Multiple sclerosis ([place]) 10/02/2015  . Chronic left shoulder pain 07/19/2015  . Multiple sclerosis exacerbation ([place]) 07/14/2015  . MS (multiple sclerosis) ([place]) 11/26/2014  . Increased body mass index 11/26/2014  . HPV test positive 11/26/2014  . Status post laparoscopic supracervical hysterectomy 11/26/2014  . Galactorrhea 11/26/2014  . Back ache 05/21/2014  . Adiposity 05/21/2014  . Disordered sleep 05/21/2014  . Muscle spasticity 05/21/2014  . Spasticity 05/21/2014  . Calculus of kidney 12/09/2013  . Renal colic 70/17/7939  . Hypercholesteremia 08/19/2013  .  Hereditary and idiopathic neuropathy 08/19/2013  . Hypercholesterolemia without hypertriglyceridemia 08/19/2013  . Bladder infection, chronic 07/25/2012  . Disorder of bladder function 07/25/2012  . Incomplete bladder emptying 07/25/2012  . Microscopic hematuria 07/25/2012  . Right upper quadrant pain 07/25/2012   Janna Arch, PT, DPT    05/14/2018, 1:17 PM  [place] MAIN [facility] SERVICES [address] [place], [place], [address] Phone: [phone]   Fax:  [phone]  Name: Frannie Shedrick Shrode MRN: [id number] Date of Birth: Nov 05, 1960

## 2018-05-16 ENCOUNTER — Ambulatory Visit: Payer: Medicare HMO

## 2018-05-16 DIAGNOSIS — M6281 Muscle weakness (generalized): Secondary | ICD-10-CM

## 2018-05-16 DIAGNOSIS — R2681 Unsteadiness on feet: Secondary | ICD-10-CM

## 2018-05-16 DIAGNOSIS — R2689 Other abnormalities of gait and mobility: Secondary | ICD-10-CM

## 2018-05-16 NOTE — Therapy (Signed)
[place] MAIN [facility] SERVICES [address] [place], [place], [address] Phone: [phone]   Fax:  [phone]  Physical Therapy Treatment  Patient Details  Name: Lisa [place] MRN: [id number] Date of Birth: [birth year] Referring Provider (PT): Gurney Maxin, MD   Encounter Date: 05/16/2018  PT End of Session - 05/16/18 1212    Visit Number  54    Number of Visits  94    Date for PT Re-Evaluation  07/04/18    Authorization Type   3/10 starting 05/09/2018    PT Start Time  1115    PT Stop Time  1158    PT Time Calculation (min)  43 min    Equipment Utilized During Treatment  Gait belt    Activity Tolerance  Patient limited by fatigue;Patient tolerated treatment well    Behavior During Therapy  [facility] for tasks assessed/performed       Past Medical History:  Diagnosis Date  . Abdominal pain, right upper quadrant   . Back pain   . Calculus of kidney 12/09/2013  . Chronic back pain    unspecified  . Chronic left shoulder pain 07/19/2015  . Functional disorder of bladder    other  . Galactorrhea 11/26/2014   Chronic   . Hereditary and idiopathic neuropathy 08/19/2013  . HPV test positive   . Hypercholesteremia 08/19/2013  . Incomplete bladder emptying   . Microscopic hematuria   . MS (multiple sclerosis) ([place])   . Muscle spasticity 05/21/2014  . Nonspecific findings on examination of urine    other  . Osteopenia   . Status post laparoscopic supracervical hysterectomy 11/26/2014  . Tobacco user 11/26/2014  . Wrist fracture     Past Surgical History:  Procedure Laterality Date  . bilateral tubal ligation  1996  . BREAST CYST EXCISION Left 2002  . KNEE SURGERY     right  . LAPAROSCOPIC SUPRACERVICAL HYSTERECTOMY  08/05/2013  . ORIF WRIST FRACTURE Left 01/17/2017   Procedure: OPEN REDUCTION INTERNAL FIXATION (ORIF) WRIST FRACTURE;  Surgeon: Lovell Sheehan, MD;  Location: [facility] ORS;  Service: Orthopedics;  Laterality: Left;  .  TUBAL LIGATION Bilateral   . VAGINAL HYSTERECTOMY  03/2006    There were no vitals filed for this visit.  Subjective Assessment - 05/16/18 1117    Subjective  Patient will be getting new AFO's next Thursday afternoon at 1. No stumbles or falls. HEP compliant. Back is feeling better now.     Patient is accompained by:  Family member    Pertinent History  Pt is a 58 y/o F who presents with BLE weakness and difficulty ambulating due to h/o MS that was diagnosed in 1995. Pt denies any pain associated. Pt was admitted to the hospital in November 2018 due to MS exacerbation. Nov-Dec to HHPT-->[facility] until end of Jan. After rehab the pt was able to ambulate up to 100 ft with the RW. Pt has Bil AFO, her therapist at [facility] thinks she needs a new AFO as her feet still drag when she ambulates, pt reports HHPT mentioned a spring AFO. Pt has custom AFOs that were originally made 4 years ago. Goals: improve balance, to be able to get into the shower with her tub bench without assist, to be able to get up 2 steps at sister's home with railings on both sides but too far apart to reach both sides, to be able to drive adapted car. Pt is staying at other sister's house with  one step to get inside and currently picking up L leg with UEs and then stepping in. Pt steps down leading with LLE. Pt wants to work on walking side to side as this is challenging and she had not yet progressed to this at rehab. Pt able to get in/out the car with assist only to bring BLEs into the car. Hoping to drive by June or July with adapted car (already has one). Pt needs assist bringing LEs into and out of hospital bed. Pt able to perform sit>stand without assist from [facility] with a pillow on the seat for a boost. Pt denies any falls in the past 6 months. Pt has been increasing her time alone at home up to a few hours at a time to become more independent. Pt has both manual and power WC. Uses power WC at home, uses manual WC out in  community. Pt able to self propel manual WC. Pt needs assist putting pants around ankles but she can pull them up. Pt can put on her shoes but needs help getting her leg up to do so. Pt able to tie her shoelaces on her own. Pt has a tub/shower unit at home with a tub bench. No grab bars in the shower. Has hand held shower head. Pt currently taking a sponge bath. Pt has a regular height toilet with a BSC over top. Pt with h/o L wrist fx with no precautions, pt wore her soft brace to evaluation for comfort but reports her wrist has healed and is doing well following OT.     Limitations  Lifting;Standing;Walking;House hold activities    How long can you sit comfortably?  n/a    How long can you stand comfortably?  3 minutes without UE assist, limited by fatigue    How long can you walk comfortably?  100 ft, limited by fatigue    Patient Stated Goals  see above    Currently in Pain?  No/denies     Standing in // bars: Weight shift laterally to promote unweighting of limb for pregait technique. Patient requires cueing to decrease pull with UE's and have more shifting with hips. Initially requires Mod A at pelvis which decreased to [place]   Standing weight shift with march 8x each LE, poor clearance bilaterally; CGA   Ambulate in // bars due to patient not having her rear walker and not comfortable using gym's walkers. Focus/cueing for lateral shift and lift of LE to promote forward propulsion.   Step over green band in // bars 5x each LE, clearing full width of band for five consecutive times for R, 2 consecutive times LLE. ; CGA with verbal cueing for weight shift  Standing tolerance in // bars with BUE support : 1 min 28 seconds; CGA with chair behind patient for safety   Seated:  Seated balloon taps reaching inside and outside BOS to promote postural strengthening.  isometric adduction against PT hand 12x 3 second holds  isometric abduction against PT hand 12x 3 second holds GTB modified hamstring  curl BLE 10x     Pt educated throughout session about proper posture and technique with exercises. Improved exercise technique, movement at target joints, use of target muscles after min to mod verbal, visual, tactile cues                    PT Education - 05/16/18 1118    Education provided  Yes    Education Details  exercise technique, stabilty  Person(s) Educated  Patient    Methods  Explanation;Demonstration;Tactile cues;Verbal cues    Comprehension  Verbalized understanding;Returned demonstration;Verbal cues required;Tactile cues required;Need further instruction       PT Short Term Goals - 04/11/18 1156      PT SHORT TERM GOAL #1   Title  Pt will be completed HEP with min assist from caregiver at least 4 days/wk for improved carryover between sessions    Baseline  HEP compliant     Time  2    Period  Weeks    Status  Achieved      PT SHORT TERM GOAL #2   Title  Patient will report a worst pain of 6/10 on VAS in cervical spine to improve tolerance with ADLs and reduced symptoms with activities.     Baseline  4/30: 9/10 5/21: 0/10 pain    Time  2    Period  Weeks    Status  Achieved      PT SHORT TERM GOAL #3   Title  Patient will perform cervical rotation at least 50 degrees, flexion and extension to 70 degrees, and sidebending to 40 degrees without pain    Baseline  4/30: R 50 L 34 Rotation, R 26 L 35 sb, Extension 35 flex 35 ; 5/21: extension 46, SB R 41 L 40, rotation R 62 L 61,     Time  2    Period  Weeks    Status  Achieved        PT Long Term Goals - 05/09/18 0001      PT LONG TERM GOAL #2   Title  Pt's 10mT will improve to at least 0.5 m/s for improved safety ambulating in home    Baseline  0.14 m/s; 5/15: .246m  7/10: .36 m/s  8/20: .42 m/s  9/26: 0.2655m10/14: .26 m/s improved hip and knee clearance 11/15: 0.76m44mmproved hip and knee clearance; 12/5: .29 m/s improved step length and forward moment: decreased foot drag. 05/09/2018:  0.21 m/s    Time  8    Period  Weeks    Status  Partially Met    Target Date  07/04/18      PT LONG TERM GOAL #3   Title  Pt's Bil hip F strength will improve to at least 2+/5 BLEs for improved gait mechanics and safety    Baseline  L: 2/5, R: 1/5 7/10: 2/5 bilaterally  8/20: 2/5 bilat 9/26: 2/5 bilat 10/14: 2/5 bilaterally ,improved hip flexor muscle activation 2/5: improved hip flexor and quad activation: 12/5: 2/5 ; 05/09/2018: 2/5    Time  8    Period  Weeks    Status  Partially Met    Target Date  07/04/18      PT LONG TERM GOAL #4   Title  Pt's ABC Scale will improve to at least 40% to demonstrate improved balance    Baseline  28.13% 5/15; 24.4% 7/10: 23.4% 8/20: 22. 2% 9/26: 25.3% 10/14: 26.5% 11/15: 24.38% 12/5: 26%; 05/09/2018: 29%    Time  6    Period  Weeks    Status  Partially Met    Target Date  07/04/18      PT LONG TERM GOAL #6   Title  Pt will be able to perform tub bench into/out of shower transfer without assist for improved independence with this task    Baseline  challenged with steps at this time, getting better, stands and gives sponge bath independently now  9/26: has not tried transfer yet 11/15: has not attempted yet; 05/09/2018: not attempted    Time  6    Period  Weeks    Status  Partially Met    Target Date  07/04/18      PT LONG TERM GOAL  #12   TITLE  Patient will negotiate one step ~4 inches to enter house independently to increase functional independence in natural environment.     Baseline  8/20: unable to do 9/26: patient too fatigued/not feeling well to perform 10/14: unable to perform at this time 11/15: unable to perform at this time; 12/5: able to lift 1 inch; 05/09/2018: unable to clear 4" step, able to lift foot ~ 1in    Time  8    Period  Weeks    Status  On-going    Target Date  07/04/18      PT LONG TERM GOAL  #13   TITLE  Patient will perform STS from standard height surface to walker to allow patient to participate and transition in public  areas when not in wheelchair.     Baseline  10/14: able to perform in W/C which is raised slightly 11/15: 20inch height 12/5: 18 inches from plinth table; 05/09/2018: 18 inches from plinth table    Time  8    Period  Weeks    Status  Partially Met    Target Date  07/04/18      PT LONG TERM GOAL  #14   TITLE  Patient will stand 5 minutes to increase standing duration to allow patient to return to washing dishes and clothes.     Baseline  12/5: 56 seconds; 05/09/2018: 1 min 18 sec    Time  8    Period  Weeks    Status  On-going    Target Date  07/04/18      PT LONG TERM GOAL  #15   TITLE  Patient will negotiate 30 ft of changing surfaces outside such as pavement and parking lot to allow patient to walk into church.     Baseline  12/5: does not walk outside    Time  8    Period  Weeks    Status  On-going    Target Date  07/04/18            Plan - 05/16/18 1215    Clinical Impression Statement  Patient demonstrates improved ability to weight shift with repetition resulting in improved clearance of BLE's with RLE >LLE. Patient fatigues in standing requiring constant CGA and active gauging by therapist for exhaustion level due to her diagnosis. Patient continues to be motivated throughout session. Patient will benefit from skilled therapeutic intervention to address deficits in strength, mobility, balance, and function to improve safety and overall QOL.    Rehab Potential  Good    PT Frequency  2x / week    PT Duration  8 weeks    PT Treatment/Interventions  ADLs/Self Care Home Management;Aquatic Therapy;Biofeedback;Cryotherapy;Electrical Stimulation;Iontophoresis 53m/ml Dexamethasone;Traction;Moist Heat;Ultrasound;DME Instruction;Gait training;Stair training;Functional mobility training;Therapeutic activities;Therapeutic exercise;Balance training;Neuromuscular re-education;Patient/family education;Orthotic Fit/Training;Wheelchair mobility training;Manual techniques;Compression  bandaging;Passive range of motion;Dry needling;Energy conservation;Taping;Splinting    PT Next Visit Plan  continue strengthening/balance    PT Home Exercise Plan  to initiate next session (please incorporate hip flexor and adductor stretches in HEP if possible    Consulted and Agree with Plan of Care  Patient       Patient will benefit from skilled therapeutic intervention in order to  improve the following deficits and impairments:  Abnormal gait, Decreased activity tolerance, Decreased balance, Decreased coordination, Decreased endurance, Decreased knowledge of use of DME, Decreased mobility, Decreased range of motion, Decreased safety awareness, Decreased strength, Difficulty walking, Impaired perceived functional ability, Impaired sensation, Impaired tone, Impaired UE functional use, Improper body mechanics, Postural dysfunction  Visit Diagnosis: Muscle weakness (generalized)  Unsteadiness on feet  Other abnormalities of gait and mobility     Problem List Patient Active Problem List   Diagnosis Date Noted  . Abscess of female pelvis   . SVT (supraventricular tachycardia) ([place])   . Radial styloid tenosynovitis 03/12/2018  . Wheelchair confinement 02/27/2018  . Localized osteoporosis with current pathological fracture with routine healing 01/19/2017  . Wrist fracture 01/16/2017  . Sprain of ankle 03/23/2016  . Closed fracture of lateral malleolus 03/16/2016  . Health care maintenance 01/24/2016  . Blood pressure elevated without history of HTN 10/25/2015  . Essential hypertension 10/25/2015  . Multiple sclerosis ([place]) 10/02/2015  . Chronic left shoulder pain 07/19/2015  . Multiple sclerosis exacerbation ([place]) 07/14/2015  . MS (multiple sclerosis) ([place]) 11/26/2014  . Increased body mass index 11/26/2014  . HPV test positive 11/26/2014  . Status post laparoscopic supracervical hysterectomy 11/26/2014  . Galactorrhea 11/26/2014  . Back ache 05/21/2014  . Adiposity 05/21/2014   . Disordered sleep 05/21/2014  . Muscle spasticity 05/21/2014  . Spasticity 05/21/2014  . Calculus of kidney 12/09/2013  . Renal colic 43/73/5789  . Hypercholesteremia 08/19/2013  . Hereditary and idiopathic neuropathy 08/19/2013  . Hypercholesterolemia without hypertriglyceridemia 08/19/2013  . Bladder infection, chronic 07/25/2012  . Disorder of bladder function 07/25/2012  . Incomplete bladder emptying 07/25/2012  . Microscopic hematuria 07/25/2012  . Right upper quadrant pain 07/25/2012   Janna Arch, PT, DPT   05/16/2018, 12:16 PM  [place] MAIN [facility] SERVICES [address] [place], [place], [address] Phone: [phone]   Fax:  [phone]  Name: Lisa Williams MRN: [id number] Date of Birth: August 13, 1960

## 2018-05-20 ENCOUNTER — Ambulatory Visit: Payer: Medicare HMO

## 2018-05-20 DIAGNOSIS — M6281 Muscle weakness (generalized): Secondary | ICD-10-CM

## 2018-05-20 DIAGNOSIS — R2681 Unsteadiness on feet: Secondary | ICD-10-CM

## 2018-05-20 DIAGNOSIS — R2689 Other abnormalities of gait and mobility: Secondary | ICD-10-CM

## 2018-05-20 NOTE — Therapy (Signed)
[place] MAIN [facility] SERVICES [place], [place], [address] Phone: [phone]   Fax:  [phone]  Physical Therapy Treatment  Patient Details  Name: Lisa Williams MRN: [id number] Date of Birth: June 19, 1960 Referring Provider (PT): Gurney Maxin, MD   Encounter Date: 05/20/2018  PT End of Session - 05/20/18 1706    Visit Number  6    Number of Visits  20    Date for PT Re-Evaluation  07/04/18    Authorization Type   4/10 starting 05/09/2018    PT Start Time  1430    PT Stop Time  1514    PT Time Calculation (min)  44 min    Equipment Utilized During Treatment  Gait belt    Activity Tolerance  Patient limited by fatigue;Patient tolerated treatment well    Behavior During Therapy  [facility] for tasks assessed/performed       Past Medical History:  Diagnosis Date  . Abdominal pain, right upper quadrant   . Back pain   . Calculus of kidney 12/09/2013  . Chronic back pain    unspecified  . Chronic left shoulder pain 07/19/2015  . Functional disorder of bladder    other  . Galactorrhea 11/26/2014   Chronic   . Hereditary and idiopathic neuropathy 08/19/2013  . HPV test positive   . Hypercholesteremia 08/19/2013  . Incomplete bladder emptying   . Microscopic hematuria   . MS (multiple sclerosis) ([place])   . Muscle spasticity 05/21/2014  . Nonspecific findings on examination of urine    other  . Osteopenia   . Status post laparoscopic supracervical hysterectomy 11/26/2014  . Tobacco user 11/26/2014  . Wrist fracture     Past Surgical History:  Procedure Laterality Date  . bilateral tubal ligation  1996  . BREAST CYST EXCISION Left 2002  . KNEE SURGERY     right  . LAPAROSCOPIC SUPRACERVICAL HYSTERECTOMY  08/05/2013  . ORIF WRIST FRACTURE Left 01/17/2017   Procedure: OPEN REDUCTION INTERNAL FIXATION (ORIF) WRIST FRACTURE;  Surgeon: Lovell Sheehan, MD;  Location: [facility] ORS;  Service: Orthopedics;  Laterality: Left;  .  TUBAL LIGATION Bilateral   . VAGINAL HYSTERECTOMY  03/2006    There were no vitals filed for this visit.  Subjective Assessment - 05/20/18 1705    Subjective  Patient had a rough time getting to hospital, almost getting hit today on her way here. Reports having slight low back pain upon questioning, has been compliant with HEP.     Patient is accompained by:  Family member    Pertinent History  Pt is a 58 y/o F who presents with BLE weakness and difficulty ambulating due to h/o MS that was diagnosed in 1995. Pt denies any pain associated. Pt was admitted to the hospital in November 2018 due to MS exacerbation. Nov-Dec to HHPT-->[facility] until end of Jan. After rehab the pt was able to ambulate up to 100 ft with the RW. Pt has Bil AFO, her therapist at [facility] thinks she needs a new AFO as her feet still drag when she ambulates, pt reports HHPT mentioned a spring AFO. Pt has custom AFOs that were originally made 4 years ago. Goals: improve balance, to be able to get into the shower with her tub bench without assist, to be able to get up 2 steps at sister's home with railings on both sides but too far apart to reach both sides, to be able to drive adapted car. Pt  is staying at other sister's house with one step to get inside and currently picking up L leg with UEs and then stepping in. Pt steps down leading with LLE. Pt wants to work on walking side to side as this is challenging and she had not yet progressed to this at rehab. Pt able to get in/out the car with assist only to bring BLEs into the car. Hoping to drive by June or July with adapted car (already has one). Pt needs assist bringing LEs into and out of hospital bed. Pt able to perform sit>stand without assist from [facility] with a pillow on the seat for a boost. Pt denies any falls in the past 6 months. Pt has been increasing her time alone at home up to a few hours at a time to become more independent. Pt has both manual and power WC. Uses power  WC at home, uses manual WC out in community. Pt able to self propel manual WC. Pt needs assist putting pants around ankles but she can pull them up. Pt can put on her shoes but needs help getting her leg up to do so. Pt able to tie her shoelaces on her own. Pt has a tub/shower unit at home with a tub bench. No grab bars in the shower. Has hand held shower head. Pt currently taking a sponge bath. Pt has a regular height toilet with a BSC over top. Pt with h/o L wrist fx with no precautions, pt wore her soft brace to evaluation for comfort but reports her wrist has healed and is doing well following OT.     Limitations  Lifting;Standing;Walking;House hold activities    How long can you sit comfortably?  n/a    How long can you stand comfortably?  3 minutes without UE assist, limited by fatigue    How long can you walk comfortably?  100 ft, limited by fatigue    Patient Stated Goals  see above    Currently in Pain?  Yes    Pain Score  2     Pain Location  Back    Pain Orientation  Lower    Pain Descriptors / Indicators  Aching    Pain Type  Acute pain    Pain Onset  Today    Pain Frequency  Intermittent      5x STS from plinth 23" sit to stand, cues for keeping knees abducted, one hand on plinth one hand on walker.; 2 sets   Ambulate 48 ft with RW and CGA, improved step length and velocity with improved clearance of BLE. LLE fatigued quicker than right resulting in foot drag last few steps.    Forward trunk lean with PVC pipe being guided by PT for low back stretch x 2 sets of 10 reps with holds for 8-10 seconds: decreased low back pain to none by end of session.   Scapular retraction squeeze with PVC pipe 10x seated for postural correction with upright posture  Seated balloon taps reaching inside and outside BOS for postural stability and core strength.   Patient requires use of ice and frequent rest breaks  between interventions to lower internal body  temperature.                         PT Education - 05/20/18 1706    Education provided  Yes    Education Details  sit to stand from lower surface, exercise technique     Person(s)  Educated  Patient    Methods  Explanation;Demonstration;Tactile cues;Verbal cues    Comprehension  Verbalized understanding;Returned demonstration;Verbal cues required;Tactile cues required       PT Short Term Goals - 04/11/18 1156      PT SHORT TERM GOAL #1   Title  Pt will be completed HEP with min assist from caregiver at least 4 days/wk for improved carryover between sessions    Baseline  HEP compliant     Time  2    Period  Weeks    Status  Achieved      PT SHORT TERM GOAL #2   Title  Patient will report a worst pain of 6/10 on VAS in cervical spine to improve tolerance with ADLs and reduced symptoms with activities.     Baseline  4/30: 9/10 5/21: 0/10 pain    Time  2    Period  Weeks    Status  Achieved      PT SHORT TERM GOAL #3   Title  Patient will perform cervical rotation at least 50 degrees, flexion and extension to 70 degrees, and sidebending to 40 degrees without pain    Baseline  4/30: R 50 L 34 Rotation, R 26 L 35 sb, Extension 35 flex 35 ; 5/21: extension 46, SB R 41 L 40, rotation R 62 L 61,     Time  2    Period  Weeks    Status  Achieved        PT Long Term Goals - 05/09/18 0001      PT LONG TERM GOAL #2   Title  Pt's 52mT will improve to at least 0.5 m/s for improved safety ambulating in home    Baseline  0.14 m/s; 5/15: .240m  7/10: .36 m/s  8/20: .42 m/s  9/26: 0.2662m10/14: .26 m/s improved hip and knee clearance 11/15: 0.21m45mmproved hip and knee clearance; 12/5: .29 m/s improved step length and forward moment: decreased foot drag. 05/09/2018: 0.21 m/s    Time  8    Period  Weeks    Status  Partially Met    Target Date  07/04/18      PT LONG TERM GOAL #3   Title  Pt's Bil hip F strength will improve to at least 2+/5 BLEs for improved gait  mechanics and safety    Baseline  L: 2/5, R: 1/5 7/10: 2/5 bilaterally  8/20: 2/5 bilat 9/26: 2/5 bilat 10/14: 2/5 bilaterally ,improved hip flexor muscle activation 2/5: improved hip flexor and quad activation: 12/5: 2/5 ; 05/09/2018: 2/5    Time  8    Period  Weeks    Status  Partially Met    Target Date  07/04/18      PT LONG TERM GOAL #4   Title  Pt's ABC Scale will improve to at least 40% to demonstrate improved balance    Baseline  28.13% 5/15; 24.4% 7/10: 23.4% 8/20: 22. 2% 9/26: 25.3% 10/14: 26.5% 11/15: 24.38% 12/5: 26%; 05/09/2018: 29%    Time  6    Period  Weeks    Status  Partially Met    Target Date  07/04/18      PT LONG TERM GOAL #6   Title  Pt will be able to perform tub bench into/out of shower transfer without assist for improved independence with this task    Baseline  challenged with steps at this time, getting better, stands and gives sponge bath independently now  9/26: has  not tried transfer yet 11/15: has not attempted yet; 05/09/2018: not attempted    Time  6    Period  Weeks    Status  Partially Met    Target Date  07/04/18      PT LONG TERM GOAL  #12   TITLE  Patient will negotiate one step ~4 inches to enter house independently to increase functional independence in natural environment.     Baseline  8/20: unable to do 9/26: patient too fatigued/not feeling well to perform 10/14: unable to perform at this time 11/15: unable to perform at this time; 12/5: able to lift 1 inch; 05/09/2018: unable to clear 4" step, able to lift foot ~ 1in    Time  8    Period  Weeks    Status  On-going    Target Date  07/04/18      PT LONG TERM GOAL  #13   TITLE  Patient will perform STS from standard height surface to walker to allow patient to participate and transition in public areas when not in wheelchair.     Baseline  10/14: able to perform in W/C which is raised slightly 11/15: 20inch height 12/5: 18 inches from plinth table; 05/09/2018: 18 inches from plinth table    Time  8     Period  Weeks    Status  Partially Met    Target Date  07/04/18      PT LONG TERM GOAL  #14   TITLE  Patient will stand 5 minutes to increase standing duration to allow patient to return to washing dishes and clothes.     Baseline  12/5: 56 seconds; 05/09/2018: 1 min 18 sec    Time  8    Period  Weeks    Status  On-going    Target Date  07/04/18      PT LONG TERM GOAL  #15   TITLE  Patient will negotiate 30 ft of changing surfaces outside such as pavement and parking lot to allow patient to walk into church.     Baseline  12/5: does not walk outside    Time  8    Period  Weeks    Status  On-going    Target Date  07/04/18            Plan - 05/20/18 1712    Clinical Impression Statement  Patient LLE fatigued quicker than RLE with ambulation however had improved gait mechanics and gait speed this session. Improved sit to stand from lower surface performed today demonstrating improved LE strength and stability. Patient continues to be motivated throughout session. Patient will benefit from skilled therapeutic intervention to address deficits in strength, mobility, balance, and function to improve safety and overall QOL.    Rehab Potential  Good    PT Frequency  2x / week    PT Duration  8 weeks    PT Treatment/Interventions  ADLs/Self Care Home Management;Aquatic Therapy;Biofeedback;Cryotherapy;Electrical Stimulation;Iontophoresis 42m/ml Dexamethasone;Traction;Moist Heat;Ultrasound;DME Instruction;Gait training;Stair training;Functional mobility training;Therapeutic activities;Therapeutic exercise;Balance training;Neuromuscular re-education;Patient/family education;Orthotic Fit/Training;Wheelchair mobility training;Manual techniques;Compression bandaging;Passive range of motion;Dry needling;Energy conservation;Taping;Splinting    PT Next Visit Plan  continue strengthening/balance    PT Home Exercise Plan  to initiate next session (please incorporate hip flexor and adductor stretches in  HEP if possible    Consulted and Agree with Plan of Care  Patient       Patient will benefit from skilled therapeutic intervention in order to improve the following deficits and impairments:  Abnormal gait, Decreased activity tolerance, Decreased balance, Decreased coordination, Decreased endurance, Decreased knowledge of use of DME, Decreased mobility, Decreased range of motion, Decreased safety awareness, Decreased strength, Difficulty walking, Impaired perceived functional ability, Impaired sensation, Impaired tone, Impaired UE functional use, Improper body mechanics, Postural dysfunction  Visit Diagnosis: Muscle weakness (generalized)  Unsteadiness on feet  Other abnormalities of gait and mobility     Problem List Patient Active Problem List   Diagnosis Date Noted  . Abscess of female pelvis   . SVT (supraventricular tachycardia) ([place])   . Radial styloid tenosynovitis 03/12/2018  . Wheelchair confinement 02/27/2018  . Localized osteoporosis with current pathological fracture with routine healing 01/19/2017  . Wrist fracture 01/16/2017  . Sprain of ankle 03/23/2016  . Closed fracture of lateral malleolus 03/16/2016  . Health care maintenance 01/24/2016  . Blood pressure elevated without history of HTN 10/25/2015  . Essential hypertension 10/25/2015  . Multiple sclerosis ([place]) 10/02/2015  . Chronic left shoulder pain 07/19/2015  . Multiple sclerosis exacerbation ([place]) 07/14/2015  . MS (multiple sclerosis) ([place]) 11/26/2014  . Increased body mass index 11/26/2014  . HPV test positive 11/26/2014  . Status post laparoscopic supracervical hysterectomy 11/26/2014  . Galactorrhea 11/26/2014  . Back ache 05/21/2014  . Adiposity 05/21/2014  . Disordered sleep 05/21/2014  . Muscle spasticity 05/21/2014  . Spasticity 05/21/2014  . Calculus of kidney 12/09/2013  . Renal colic 99/01/3999  . Hypercholesteremia 08/19/2013  . Hereditary and idiopathic neuropathy 08/19/2013  .  Hypercholesterolemia without hypertriglyceridemia 08/19/2013  . Bladder infection, chronic 07/25/2012  . Disorder of bladder function 07/25/2012  . Incomplete bladder emptying 07/25/2012  . Microscopic hematuria 07/25/2012  . Right upper quadrant pain 07/25/2012   Janna Arch, PT, DPT   05/20/2018, 5:16 PM  [place] MAIN [facility] SERVICES [address] [place], [place], [address] Phone: [phone]   Fax:  [phone]  Name: Keshayla Schrum Matsunaga MRN: [id number] Date of Birth: 11-16-1960

## 2018-05-22 ENCOUNTER — Ambulatory Visit: Payer: Medicare HMO

## 2018-05-22 DIAGNOSIS — R2681 Unsteadiness on feet: Secondary | ICD-10-CM

## 2018-05-22 DIAGNOSIS — M6281 Muscle weakness (generalized): Secondary | ICD-10-CM

## 2018-05-22 DIAGNOSIS — R2689 Other abnormalities of gait and mobility: Secondary | ICD-10-CM

## 2018-05-22 NOTE — Therapy (Signed)
[place] MAIN [facility] SERVICES [address] [place], [place], [address] Phone: [phone]   Fax:  [phone]  Physical Therapy Treatment  Patient Details  Name: Lisa Williams MRN: [id number] Date of Birth: 11/25/1960 Referring Provider (PT): Gurney Maxin, MD   Encounter Date: 05/22/2018  PT End of Session - 05/22/18 1451    Visit Number  79    Number of Visits  83    Date for PT Re-Evaluation  07/04/18    Authorization Type   5/10 starting 05/09/2018    PT Start Time  1430    PT Stop Time  1515    PT Time Calculation (min)  45 min    Equipment Utilized During Treatment  Gait belt    Activity Tolerance  Patient limited by fatigue;Patient tolerated treatment well    Behavior During Therapy  [facility] for tasks assessed/performed       Past Medical History:  Diagnosis Date  . Abdominal pain, right upper quadrant   . Back pain   . Calculus of kidney 12/09/2013  . Chronic back pain    unspecified  . Chronic left shoulder pain 07/19/2015  . Functional disorder of bladder    other  . Galactorrhea 11/26/2014   Chronic   . Hereditary and idiopathic neuropathy 08/19/2013  . HPV test positive   . Hypercholesteremia 08/19/2013  . Incomplete bladder emptying   . Microscopic hematuria   . MS (multiple sclerosis) ([place])   . Muscle spasticity 05/21/2014  . Nonspecific findings on examination of urine    other  . Osteopenia   . Status post laparoscopic supracervical hysterectomy 11/26/2014  . Tobacco user 11/26/2014  . Wrist fracture     Past Surgical History:  Procedure Laterality Date  . bilateral tubal ligation  1996  . BREAST CYST EXCISION Left 2002  . KNEE SURGERY     right  . LAPAROSCOPIC SUPRACERVICAL HYSTERECTOMY  08/05/2013  . ORIF WRIST FRACTURE Left 01/17/2017   Procedure: OPEN REDUCTION INTERNAL FIXATION (ORIF) WRIST FRACTURE;  Surgeon: Lovell Sheehan, MD;  Location: [facility] ORS;  Service: Orthopedics;  Laterality: Left;  .  TUBAL LIGATION Bilateral   . VAGINAL HYSTERECTOMY  03/2006    There were no vitals filed for this visit.  Subjective Assessment - 05/22/18 1442    Subjective  Patient presents with a good day, has been compliant with HEP. No falls or LOB    Patient is accompained by:  Family member    Pertinent History  Pt is a 58 y/o F who presents with BLE weakness and difficulty ambulating due to h/o MS that was diagnosed in 1995. Pt denies any pain associated. Pt was admitted to the hospital in November 2018 due to MS exacerbation. Nov-Dec to HHPT-->[facility] until end of Jan. After rehab the pt was able to ambulate up to 100 ft with the RW. Pt has Bil AFO, her therapist at [place] thinks she needs a new AFO as her feet still drag when she ambulates, pt reports HHPT mentioned a spring AFO. Pt has custom AFOs that were originally made 4 years ago. Goals: improve balance, to be able to get into the shower with her tub bench without assist, to be able to get up 2 steps at sister's home with railings on both sides but too far apart to reach both sides, to be able to drive adapted car. Pt is staying at other sister's house with one step to get inside and currently picking  up L leg with UEs and then stepping in. Pt steps down leading with LLE. Pt wants to work on walking side to side as this is challenging and she had not yet progressed to this at rehab. Pt able to get in/out the car with assist only to bring BLEs into the car. Hoping to drive by June or July with adapted car (already has one). Pt needs assist bringing LEs into and out of hospital bed. Pt able to perform sit>stand without assist from [facility] with a pillow on the seat for a boost. Pt denies any falls in the past 6 months. Pt has been increasing her time alone at home up to a few hours at a time to become more independent. Pt has both manual and power WC. Uses power WC at home, uses manual WC out in community. Pt able to self propel manual WC. Pt needs assist  putting pants around ankles but she can pull them up. Pt can put on her shoes but needs help getting her leg up to do so. Pt able to tie her shoelaces on her own. Pt has a tub/shower unit at home with a tub bench. No grab bars in the shower. Has hand held shower head. Pt currently taking a sponge bath. Pt has a regular height toilet with a BSC over top. Pt with h/o L wrist fx with no precautions, pt wore her soft brace to evaluation for comfort but reports her wrist has healed and is doing well following OT.     Limitations  Lifting;Standing;Walking;House hold activities    How long can you sit comfortably?  n/a    How long can you stand comfortably?  3 minutes without UE assist, limited by fatigue    How long can you walk comfortably?  100 ft, limited by fatigue    Patient Stated Goals  see above    Currently in Pain?  No/denies             Standing in // bars: Weight shift laterally to promote unweighting of limb for pregait technique. Patient requires cueing to decrease pull with UE's and have more shifting with hips. Initially requires Mod A at pelvis which decreased to [place]   Standing weight shift with march 8x each LE, poor clearance bilaterally; CGA  Ambulate in // bars due to patient not having her rear walker and not comfortable using gym's walkers. Focus/cueing for lateral shift and lift of LE to promote forward propulsion.   Step over green band in // bars 5x each LE, clearing full width of band for five consecutive times for R, 3 consecutive times LLE. ; CGA with verbal cueing for weight shift  Standing tolerance in // bars with BUE support :2 minutes 18s; CGA with chair behind patient for safety (50 seconds longer than two sessions ago)   Seated: Seated balloon taps reaching inside and outside BOS to promote postural strengthening.  isometric adduction against PT hand 12x 3 second holds  isometric abduction against PT hand 12x 3 second holds Isometric hip flexion  against PT hand 12x 3 second holds GTB modified hamstring curl BLE 10x    Pt educated throughout session about proper posture and technique with exercises. Improved exercise technique, movement at target joints, use of target muscles after min to mod verbal, visual, tactile cues                         PT Education - 05/22/18 1445  Education provided  Yes    Education Details  exercise technique, stability,     Person(s) Educated  Patient    Methods  Explanation;Demonstration;Tactile cues;Verbal cues    Comprehension  Verbalized understanding;Returned demonstration;Tactile cues required;Need further instruction;Verbal cues required       PT Short Term Goals - 04/11/18 1156      PT SHORT TERM GOAL #1   Title  Pt will be completed HEP with min assist from caregiver at least 4 days/wk for improved carryover between sessions    Baseline  HEP compliant     Time  2    Period  Weeks    Status  Achieved      PT SHORT TERM GOAL #2   Title  Patient will report a worst pain of 6/10 on VAS in cervical spine to improve tolerance with ADLs and reduced symptoms with activities.     Baseline  4/30: 9/10 5/21: 0/10 pain    Time  2    Period  Weeks    Status  Achieved      PT SHORT TERM GOAL #3   Title  Patient will perform cervical rotation at least 50 degrees, flexion and extension to 70 degrees, and sidebending to 40 degrees without pain    Baseline  4/30: R 50 L 34 Rotation, R 26 L 35 sb, Extension 35 flex 35 ; 5/21: extension 46, SB R 41 L 40, rotation R 62 L 61,     Time  2    Period  Weeks    Status  Achieved        PT Long Term Goals - 05/09/18 0001      PT LONG TERM GOAL #2   Title  Pt's 44mT will improve to at least 0.5 m/s for improved safety ambulating in home    Baseline  0.14 m/s; 5/15: .239m  7/10: .36 m/s  8/20: .42 m/s  9/26: 0.2670m10/14: .26 m/s improved hip and knee clearance 11/15: 0.54m9mmproved hip and knee clearance; 12/5: .29 m/s  improved step length and forward moment: decreased foot drag. 05/09/2018: 0.21 m/s    Time  8    Period  Weeks    Status  Partially Met    Target Date  07/04/18      PT LONG TERM GOAL #3   Title  Pt's Bil hip F strength will improve to at least 2+/5 BLEs for improved gait mechanics and safety    Baseline  L: 2/5, R: 1/5 7/10: 2/5 bilaterally  8/20: 2/5 bilat 9/26: 2/5 bilat 10/14: 2/5 bilaterally ,improved hip flexor muscle activation 2/5: improved hip flexor and quad activation: 12/5: 2/5 ; 05/09/2018: 2/5    Time  8    Period  Weeks    Status  Partially Met    Target Date  07/04/18      PT LONG TERM GOAL #4   Title  Pt's ABC Scale will improve to at least 40% to demonstrate improved balance    Baseline  28.13% 5/15; 24.4% 7/10: 23.4% 8/20: 22. 2% 9/26: 25.3% 10/14: 26.5% 11/15: 24.38% 12/5: 26%; 05/09/2018: 29%    Time  6    Period  Weeks    Status  Partially Met    Target Date  07/04/18      PT LONG TERM GOAL #6   Title  Pt will be able to perform tub bench into/out of shower transfer without assist for improved independence with this task  Baseline  challenged with steps at this time, getting better, stands and gives sponge bath independently now  9/26: has not tried transfer yet 11/15: has not attempted yet; 05/09/2018: not attempted    Time  6    Period  Weeks    Status  Partially Met    Target Date  07/04/18      PT LONG TERM GOAL  #12   TITLE  Patient will negotiate one step ~4 inches to enter house independently to increase functional independence in natural environment.     Baseline  8/20: unable to do 9/26: patient too fatigued/not feeling well to perform 10/14: unable to perform at this time 11/15: unable to perform at this time; 12/5: able to lift 1 inch; 05/09/2018: unable to clear 4" step, able to lift foot ~ 1in    Time  8    Period  Weeks    Status  On-going    Target Date  07/04/18      PT LONG TERM GOAL  #13   TITLE  Patient will perform STS from standard height  surface to walker to allow patient to participate and transition in public areas when not in wheelchair.     Baseline  10/14: able to perform in W/C which is raised slightly 11/15: 20inch height 12/5: 18 inches from plinth table; 05/09/2018: 18 inches from plinth table    Time  8    Period  Weeks    Status  Partially Met    Target Date  07/04/18      PT LONG TERM GOAL  #14   TITLE  Patient will stand 5 minutes to increase standing duration to allow patient to return to washing dishes and clothes.     Baseline  12/5: 56 seconds; 05/09/2018: 1 min 18 sec    Time  8    Period  Weeks    Status  On-going    Target Date  07/04/18      PT LONG TERM GOAL  #15   TITLE  Patient will negotiate 30 ft of changing surfaces outside such as pavement and parking lot to allow patient to walk into church.     Baseline  12/5: does not walk outside    Time  8    Period  Weeks    Status  On-going    Target Date  07/04/18            Plan - 05/22/18 1452    Clinical Impression Statement  Patient continues to progress with ability to perform foot clearance bilaterally, however continues to be challenged with LLE clearance more than right. Patient demonstrates improved activation of LE's with decreasing reliance upon UE's. Patient will benefit from skilled therapeutic intervention to address deficits in strength, mobility, balance, and function to improve safety and overall QOL.    Rehab Potential  Good    PT Frequency  2x / week    PT Duration  8 weeks    PT Treatment/Interventions  ADLs/Self Care Home Management;Aquatic Therapy;Biofeedback;Cryotherapy;Electrical Stimulation;Iontophoresis 72m/ml Dexamethasone;Traction;Moist Heat;Ultrasound;DME Instruction;Gait training;Stair training;Functional mobility training;Therapeutic activities;Therapeutic exercise;Balance training;Neuromuscular re-education;Patient/family education;Orthotic Fit/Training;Wheelchair mobility training;Manual techniques;Compression  bandaging;Passive range of motion;Dry needling;Energy conservation;Taping;Splinting    PT Next Visit Plan  continue strengthening/balance    PT Home Exercise Plan  to initiate next session (please incorporate hip flexor and adductor stretches in HEP if possible    Consulted and Agree with Plan of Care  Patient       Patient will benefit  from skilled therapeutic intervention in order to improve the following deficits and impairments:  Abnormal gait, Decreased activity tolerance, Decreased balance, Decreased coordination, Decreased endurance, Decreased knowledge of use of DME, Decreased mobility, Decreased range of motion, Decreased safety awareness, Decreased strength, Difficulty walking, Impaired perceived functional ability, Impaired sensation, Impaired tone, Impaired UE functional use, Improper body mechanics, Postural dysfunction  Visit Diagnosis: Muscle weakness (generalized)  Unsteadiness on feet  Other abnormalities of gait and mobility     Problem List Patient Active Problem List   Diagnosis Date Noted  . Abscess of female pelvis   . SVT (supraventricular tachycardia) ([place])   . Radial styloid tenosynovitis 03/12/2018  . Wheelchair confinement 02/27/2018  . Localized osteoporosis with current pathological fracture with routine healing 01/19/2017  . Wrist fracture 01/16/2017  . Sprain of ankle 03/23/2016  . Closed fracture of lateral malleolus 03/16/2016  . Health care maintenance 01/24/2016  . Blood pressure elevated without history of HTN 10/25/2015  . Essential hypertension 10/25/2015  . Multiple sclerosis ([place]) 10/02/2015  . Chronic left shoulder pain 07/19/2015  . Multiple sclerosis exacerbation ([place]) 07/14/2015  . MS (multiple sclerosis) ([place]) 11/26/2014  . Increased body mass index 11/26/2014  . HPV test positive 11/26/2014  . Status post laparoscopic supracervical hysterectomy 11/26/2014  . Galactorrhea 11/26/2014  . Back ache 05/21/2014  . Adiposity 05/21/2014   . Disordered sleep 05/21/2014  . Muscle spasticity 05/21/2014  . Spasticity 05/21/2014  . Calculus of kidney 12/09/2013  . Renal colic 94/11/6806  . Hypercholesteremia 08/19/2013  . Hereditary and idiopathic neuropathy 08/19/2013  . Hypercholesterolemia without hypertriglyceridemia 08/19/2013  . Bladder infection, chronic 07/25/2012  . Disorder of bladder function 07/25/2012  . Incomplete bladder emptying 07/25/2012  . Microscopic hematuria 07/25/2012  . Right upper quadrant pain 07/25/2012   Janna Arch, PT, DPT   05/22/2018, 3:15 PM  [place] MAIN [facility] SERVICES [address] [place], [place], [address] Phone: [phone]   Fax:  [phone]  Name: Lisa Williams MRN: [id number] Date of Birth: 19-Jan-1961

## 2018-05-27 ENCOUNTER — Ambulatory Visit: Payer: Medicare HMO | Admitting: Physical Therapy

## 2018-05-27 ENCOUNTER — Encounter: Payer: Self-pay | Admitting: Physical Therapy

## 2018-05-27 DIAGNOSIS — M6281 Muscle weakness (generalized): Secondary | ICD-10-CM

## 2018-05-27 DIAGNOSIS — R2689 Other abnormalities of gait and mobility: Secondary | ICD-10-CM

## 2018-05-27 DIAGNOSIS — R2681 Unsteadiness on feet: Secondary | ICD-10-CM

## 2018-05-27 NOTE — Therapy (Signed)
[place] MAIN [facility] SERVICES [address] [place], [place], [address] Phone: [phone]   Fax:  [phone]  Physical Therapy Treatment  Patient Details  Name: Lisa Williams MRN: [id number] Date of Birth: 25-Aug-1960 Referring Provider (PT): Lisa Maxin, MD   Encounter Date: 05/27/2018  PT End of Session - 05/27/18 1113    Visit Number  64    Number of Visits  73    Date for PT Re-Evaluation  07/04/18    Authorization Type   6/10 starting 05/09/2018    PT Start Time  1115    PT Stop Time  1155    PT Time Calculation (min)  40 min    Equipment Utilized During Treatment  Gait belt    Activity Tolerance  Patient limited by fatigue;Patient tolerated treatment well    Behavior During Therapy  [facility] for tasks assessed/performed       Past Medical History:  Diagnosis Date  . Abdominal pain, right upper quadrant   . Back pain   . Calculus of kidney 12/09/2013  . Chronic back pain    unspecified  . Chronic left shoulder pain 07/19/2015  . Functional disorder of bladder    other  . Galactorrhea 11/26/2014   Chronic   . Hereditary and idiopathic neuropathy 08/19/2013  . HPV test positive   . Hypercholesteremia 08/19/2013  . Incomplete bladder emptying   . Microscopic hematuria   . MS (multiple sclerosis) ([place])   . Muscle spasticity 05/21/2014  . Nonspecific findings on examination of urine    other  . Osteopenia   . Status post laparoscopic supracervical hysterectomy 11/26/2014  . Tobacco user 11/26/2014  . Wrist fracture     Past Surgical History:  Procedure Laterality Date  . bilateral tubal ligation  1996  . BREAST CYST EXCISION Left 2002  . KNEE SURGERY     right  . LAPAROSCOPIC SUPRACERVICAL HYSTERECTOMY  08/05/2013  . ORIF WRIST FRACTURE Left 01/17/2017   Procedure: OPEN REDUCTION INTERNAL FIXATION (ORIF) WRIST FRACTURE;  Surgeon: Lovell Sheehan, MD;  Location: [facility] ORS;  Service: Orthopedics;  Laterality: Left;  .  TUBAL LIGATION Bilateral   . VAGINAL HYSTERECTOMY  03/2006    There were no vitals filed for this visit.  Subjective Assessment - 05/27/18 1141    Subjective  Patient reports no falls or LOB. She states that she has been doing her work at home and feels things are progressing even though it's slowly.    Patient is accompained by:  Family member    Pertinent History  Pt is a 58 y/o F who presents with BLE weakness and difficulty ambulating due to h/o MS that was diagnosed in 1995. Pt denies any pain associated. Pt was admitted to the hospital in November 2018 due to MS exacerbation. Nov-Dec to HHPT-->[facility] until end of Jan. After rehab the pt was able to ambulate up to 100 ft with the RW. Pt has Bil AFO, her therapist at [facility] thinks she needs a new AFO as her feet still drag when she ambulates, pt reports HHPT mentioned a spring AFO. Pt has custom AFOs that were originally made 4 years ago. Goals: improve balance, to be able to get into the shower with her tub bench without assist, to be able to get up 2 steps at sister's home with railings on both sides but too far apart to reach both sides, to be able to drive adapted car. Pt is staying at other  sister's house with one step to get inside and currently picking up L leg with UEs and then stepping in. Pt steps down leading with LLE. Pt wants to work on walking side to side as this is challenging and she had not yet progressed to this at rehab. Pt able to get in/out the car with assist only to bring BLEs into the car. Hoping to drive by June or July with adapted car (already has one). Pt needs assist bringing LEs into and out of hospital bed. Pt able to perform sit>stand without assist from [facility] with a pillow on the seat for a boost. Pt denies any falls in the past 6 months. Pt has been increasing her time alone at home up to a few hours at a time to become more independent. Pt has both manual and power WC. Uses power WC at home, uses manual WC out  in community. Pt able to self propel manual WC. Pt needs assist putting pants around ankles but she can pull them up. Pt can put on her shoes but needs help getting her leg up to do so. Pt able to tie her shoelaces on her own. Pt has a tub/shower unit at home with a tub bench. No grab bars in the shower. Has hand held shower head. Pt currently taking a sponge bath. Pt has a regular height toilet with a BSC over top. Pt with h/o L wrist fx with no precautions, pt wore her soft brace to evaluation for comfort but reports her wrist has healed and is doing well following OT.     Limitations  Lifting;Standing;Walking;House hold activities    How long can you sit comfortably?  n/a    How long can you stand comfortably?  3 minutes without UE assist, limited by fatigue    How long can you walk comfortably?  100 ft, limited by fatigue    Patient Stated Goals  see above    Currently in Pain?  No/denies         TREATMENT  Therapeutic Exercise: // bars: Standing tolerance: 2.5 minutes, no LOB, BUE support, SBA/mod IND Ambulating x2 laps, no LOB, BUE support, SBA. Patient able to achieve consistent foot clearance and stride length on RLE, LLE is less consistent, but patient is able to clear LLE.   Neuromuscular Re-education: // bars: Lateral weight shifts x10 to each side, SBA, BUE support to faded UE support. Patient requires VCs to maintain postural control and slow center of mass when shifting to prevent reliance on momentum for weight shifts.  Forward weight shifts in modified tandem stance, BLE x10, SBA, BUE support. Patient requires VCs for improved postural control and identifying limits of stability.  Foot clearance w/ postural control over a band, 2x5 BLE, BUE support, SBA. Patient requires VCs for controlled movement. Patient able to achieve consistent foot clearance on RLE, LLE was able to achieve 3 reps prior to the toe catching in trial one and 4 reps in trial two.   Patient Response to  interventions:  Patient continues to have fatigue during standing activities requiring seated breaks. However, she is progressing in her standing tolerance and demonstrates greater consistency with her LE clearance during ambulation and weight shifting activities.     PT Education - 05/27/18 1142    Education provided  Yes    Education Details  exercise technique, postural control    Person(s) Educated  Patient    Methods  Explanation;Demonstration;Verbal cues    Comprehension  Verbalized understanding;Need  further instruction;Returned demonstration       PT Short Term Goals - 04/11/18 1156      PT SHORT TERM GOAL #1   Title  Pt will be completed HEP with min assist from caregiver at least 4 days/wk for improved carryover between sessions    Baseline  HEP compliant     Time  2    Period  Weeks    Status  Achieved      PT SHORT TERM GOAL #2   Title  Patient will report a worst pain of 6/10 on VAS in cervical spine to improve tolerance with ADLs and reduced symptoms with activities.     Baseline  4/30: 9/10 5/21: 0/10 pain    Time  2    Period  Weeks    Status  Achieved      PT SHORT TERM GOAL #3   Title  Patient will perform cervical rotation at least 50 degrees, flexion and extension to 70 degrees, and sidebending to 40 degrees without pain    Baseline  4/30: R 50 L 34 Rotation, R 26 L 35 sb, Extension 35 flex 35 ; 5/21: extension 46, SB R 41 L 40, rotation R 62 L 61,     Time  2    Period  Weeks    Status  Achieved        PT Long Term Goals - 05/09/18 0001      PT LONG TERM GOAL #2   Title  Pt's 25mT will improve to at least 0.5 m/s for improved safety ambulating in home    Baseline  0.14 m/s; 5/15: .22m  7/10: .36 m/s  8/20: .42 m/s  9/26: 0.2629m10/14: .26 m/s improved hip and knee clearance 11/15: 0.63m18mmproved hip and knee clearance; 12/5: .29 m/s improved step length and forward moment: decreased foot drag. 05/09/2018: 0.21 m/s    Time  8    Period  Weeks     Status  Partially Met    Target Date  07/04/18      PT LONG TERM GOAL #3   Title  Pt's Bil hip F strength will improve to at least 2+/5 BLEs for improved gait mechanics and safety    Baseline  L: 2/5, R: 1/5 7/10: 2/5 bilaterally  8/20: 2/5 bilat 9/26: 2/5 bilat 10/14: 2/5 bilaterally ,improved hip flexor muscle activation 2/5: improved hip flexor and quad activation: 12/5: 2/5 ; 05/09/2018: 2/5    Time  8    Period  Weeks    Status  Partially Met    Target Date  07/04/18      PT LONG TERM GOAL #4   Title  Pt's ABC Scale will improve to at least 40% to demonstrate improved balance    Baseline  28.13% 5/15; 24.4% 7/10: 23.4% 8/20: 22. 2% 9/26: 25.3% 10/14: 26.5% 11/15: 24.38% 12/5: 26%; 05/09/2018: 29%    Time  6    Period  Weeks    Status  Partially Met    Target Date  07/04/18      PT LONG TERM GOAL #6   Title  Pt will be able to perform tub bench into/out of shower transfer without assist for improved independence with this task    Baseline  challenged with steps at this time, getting better, stands and gives sponge bath independently now  9/26: has not tried transfer yet 11/15: has not attempted yet; 05/09/2018: not attempted    Time  6  Period  Weeks    Status  Partially Met    Target Date  07/04/18      PT LONG TERM GOAL  #12   TITLE  Patient will negotiate one step ~4 inches to enter house independently to increase functional independence in natural environment.     Baseline  8/20: unable to do 9/26: patient too fatigued/not feeling well to perform 10/14: unable to perform at this time 11/15: unable to perform at this time; 12/5: able to lift 1 inch; 05/09/2018: unable to clear 4" step, able to lift foot ~ 1in    Time  8    Period  Weeks    Status  On-going    Target Date  07/04/18      PT LONG TERM GOAL  #13   TITLE  Patient will perform STS from standard height surface to walker to allow patient to participate and transition in public areas when not in wheelchair.     Baseline   10/14: able to perform in W/C which is raised slightly 11/15: 20inch height 12/5: 18 inches from plinth table; 05/09/2018: 18 inches from plinth table    Time  8    Period  Weeks    Status  Partially Met    Target Date  07/04/18      PT LONG TERM GOAL  #14   TITLE  Patient will stand 5 minutes to increase standing duration to allow patient to return to washing dishes and clothes.     Baseline  12/5: 56 seconds; 05/09/2018: 1 min 18 sec    Time  8    Period  Weeks    Status  On-going    Target Date  07/04/18      PT LONG TERM GOAL  #15   TITLE  Patient will negotiate 30 ft of changing surfaces outside such as pavement and parking lot to allow patient to walk into church.     Baseline  12/5: does not walk outside    Time  8    Period  Weeks    Status  On-going    Target Date  07/04/18            Plan - 05/27/18 1233    Clinical Impression Statement  Patient continues to present with excellent motivation and is progressing with foot clearance (R>L), standing tolerance (> 2 min) with BUE support, and postural control with weight shifts. Patient will continue to benefit from skilled therapeutic intervention to address deficits in strength, monbility, balance, and function for improved overall QOL and safety.    Rehab Potential  Good    PT Frequency  2x / week    PT Duration  8 weeks    PT Treatment/Interventions  ADLs/Self Care Home Management;Aquatic Therapy;Biofeedback;Cryotherapy;Electrical Stimulation;Iontophoresis 29m/ml Dexamethasone;Traction;Moist Heat;Ultrasound;DME Instruction;Gait training;Stair training;Functional mobility training;Therapeutic activities;Therapeutic exercise;Balance training;Neuromuscular re-education;Patient/family education;Orthotic Fit/Training;Wheelchair mobility training;Manual techniques;Compression bandaging;Passive range of motion;Dry needling;Energy conservation;Taping;Splinting    PT Next Visit Plan  continue strengthening/balance    PT Home Exercise  Plan  to initiate next session (please incorporate hip flexor and adductor stretches in HEP if possible    Consulted and Agree with Plan of Care  Patient       Patient will benefit from skilled therapeutic intervention in order to improve the following deficits and impairments:  Abnormal gait, Decreased activity tolerance, Decreased balance, Decreased coordination, Decreased endurance, Decreased knowledge of use of DME, Decreased mobility, Decreased range of motion, Decreased safety awareness, Decreased strength, Difficulty walking,  Impaired perceived functional ability, Impaired sensation, Impaired tone, Impaired UE functional use, Improper body mechanics, Postural dysfunction  Visit Diagnosis: Muscle weakness (generalized)  Unsteadiness on feet  Other abnormalities of gait and mobility     Problem List Patient Active Problem List   Diagnosis Date Noted  . Abscess of female pelvis   . SVT (supraventricular tachycardia) ([place])   . Radial styloid tenosynovitis 03/12/2018  . Wheelchair confinement 02/27/2018  . Localized osteoporosis with current pathological fracture with routine healing 01/19/2017  . Wrist fracture 01/16/2017  . Sprain of ankle 03/23/2016  . Closed fracture of lateral malleolus 03/16/2016  . Health care maintenance 01/24/2016  . Blood pressure elevated without history of HTN 10/25/2015  . Essential hypertension 10/25/2015  . Multiple sclerosis ([place]) 10/02/2015  . Chronic left shoulder pain 07/19/2015  . Multiple sclerosis exacerbation ([place]) 07/14/2015  . MS (multiple sclerosis) ([place]) 11/26/2014  . Increased body mass index 11/26/2014  . HPV test positive 11/26/2014  . Status post laparoscopic supracervical hysterectomy 11/26/2014  . Galactorrhea 11/26/2014  . Back ache 05/21/2014  . Adiposity 05/21/2014  . Disordered sleep 05/21/2014  . Muscle spasticity 05/21/2014  . Spasticity 05/21/2014  . Calculus of kidney 12/09/2013  . Renal colic 54/98/2641  .  Hypercholesteremia 08/19/2013  . Hereditary and idiopathic neuropathy 08/19/2013  . Hypercholesterolemia without hypertriglyceridemia 08/19/2013  . Bladder infection, chronic 07/25/2012  . Disorder of bladder function 07/25/2012  . Incomplete bladder emptying 07/25/2012  . Microscopic hematuria 07/25/2012  . Right upper quadrant pain 07/25/2012   Myles Gip PT, DPT [phone] 05/27/2018, 12:44 PM  [place] MAIN [facility] SERVICES [address] [place], [place], [address] Phone: [phone]   Fax:  [phone]  Name: Chrys Landgrebe [place] MRN: [id number] Date of Birth: May 07, 1961

## 2018-05-29 ENCOUNTER — Ambulatory Visit: Payer: Medicare HMO

## 2018-05-29 DIAGNOSIS — R2689 Other abnormalities of gait and mobility: Secondary | ICD-10-CM

## 2018-05-29 DIAGNOSIS — R2681 Unsteadiness on feet: Secondary | ICD-10-CM

## 2018-05-29 DIAGNOSIS — M6281 Muscle weakness (generalized): Secondary | ICD-10-CM

## 2018-05-29 NOTE — Therapy (Signed)
[facility] [address] [place], [place], [address] Phone: [phone]   Fax:  [phone]  Physical Therapy Treatment  Patient Details  Name: Lisa Williams MRN: [id number] Date of Birth: Jun 25, 1960 Referring Provider (PT): Gurney Maxin, MD   Encounter Date: 05/29/2018  PT End of Session - 05/29/18 1353    Visit Number  38    Number of Visits  94    Date for PT Re-Evaluation  07/04/18    Authorization Type   7/10 starting 05/09/2018    PT Start Time  1114    PT Stop Time  1200    PT Time Calculation (min)  46 min    Equipment Utilized During Treatment  Gait belt    Activity Tolerance  Patient limited by fatigue;Patient tolerated treatment well    Behavior During Therapy  [facility] [place] for tasks assessed/performed       Past Medical History:  Diagnosis Date  . Abdominal pain, right upper quadrant   . Back pain   . Calculus of kidney 12/09/2013  . Chronic back pain    unspecified  . Chronic left shoulder pain 07/19/2015  . Functional disorder of bladder    other  . Galactorrhea 11/26/2014   Chronic   . Hereditary and idiopathic neuropathy 08/19/2013  . HPV test positive   . Hypercholesteremia 08/19/2013  . Incomplete bladder emptying   . Microscopic hematuria   . MS (multiple sclerosis) ([place])   . Muscle spasticity 05/21/2014  . Nonspecific findings on examination of urine    other  . Osteopenia   . Status post laparoscopic supracervical hysterectomy 11/26/2014  . Tobacco user 11/26/2014  . Wrist fracture     Past Surgical History:  Procedure Laterality Date  . bilateral tubal ligation  1996  . BREAST CYST EXCISION Left 2002  . KNEE SURGERY     right  . LAPAROSCOPIC SUPRACERVICAL HYSTERECTOMY  08/05/2013  . ORIF WRIST FRACTURE Left 01/17/2017   Procedure: OPEN REDUCTION INTERNAL FIXATION (ORIF) WRIST FRACTURE;  Surgeon: Lovell Sheehan, MD;  Location: [facility] ORS;  Service: Orthopedics;  Laterality: Left;  .  TUBAL LIGATION Bilateral   . VAGINAL HYSTERECTOMY  03/2006    There were no vitals filed for this visit.  Subjective Assessment - 05/29/18 1256    Subjective  Patient reports her right wrist/carpal tunnel is bothering her today. Has been feeling very cold due to the weather but has been compliant with HEP.     Patient is accompained by:  Family member    Pertinent History  Pt is a 58 y/o F who presents with BLE weakness and difficulty ambulating due to h/o MS that was diagnosed in 1995. Pt denies any pain associated. Pt was admitted to the hospital in November 2018 due to MS exacerbation. Nov-Dec to HHPT-->[facility] until end of Jan. After rehab the pt was able to ambulate up to 100 ft with the RW. Pt has Bil AFO, her therapist at [facility] thinks she needs a new AFO as her feet still drag when she ambulates, pt reports HHPT mentioned a spring AFO. Pt has custom AFOs that were originally made 4 years ago. Goals: improve balance, to be able to get into the shower with her tub bench without assist, to be able to get up 2 steps at sister's home with railings on both sides but too far apart to reach both sides, to be able to drive adapted car. Pt is staying at other  sister's house with one step to get inside and currently picking up L leg with UEs and then stepping in. Pt steps down leading with LLE. Pt wants to work on walking side to side as this is challenging and she had not yet progressed to this at rehab. Pt able to get in/out the car with assist only to bring BLEs into the car. Hoping to drive by June or July with adapted car (already has one). Pt needs assist bringing LEs into and out of hospital bed. Pt able to perform sit>stand without assist from [facility] with a pillow on the seat for a boost. Pt denies any falls in the past 6 months. Pt has been increasing her time alone at home up to a few hours at a time to become more independent. Pt has both manual and power WC. Uses power WC at home, uses manual  WC out in community. Pt able to self propel manual WC. Pt needs assist putting pants around ankles but she can pull them up. Pt can put on her shoes but needs help getting her leg up to do so. Pt able to tie her shoelaces on her own. Pt has a tub/shower unit at home with a tub bench. No grab bars in the shower. Has hand held shower head. Pt currently taking a sponge bath. Pt has a regular height toilet with a BSC over top. Pt with h/o L wrist fx with no precautions, pt wore her soft brace to evaluation for comfort but reports her wrist has healed and is doing well following OT.     Limitations  Lifting;Standing;Walking;House hold activities    How long can you sit comfortably?  n/a    How long can you stand comfortably?  3 minutes without UE assist, limited by fatigue    How long can you walk comfortably?  100 ft, limited by fatigue    Patient Stated Goals  see above    Currently in Pain?  Yes    Pain Score  3     Pain Location  Wrist    Pain Orientation  Right    Pain Descriptors / Indicators  Aching    Pain Type  Acute pain    Pain Onset  Yesterday    Pain Frequency  Intermittent         Ambulate 52 ft with RW and CGA, improved step length and velocity with improved clearance of BLE. LLE fatigued quicker than right resulting in foot drag last few steps. Verbal cueing for weight shift via hips rather than UE's and upright posture for improved clearance   Sit to stand from plinth table positioned for knees to be at 90 90 x2 trials with RW and CGA, terminated due to increased R wrist pain to avoid injury  Seated:   Elevated seat to reduce friction on foot: soccer ball LAQ 12x each LE to promote coordination, muscle recruitment response timing to a stimuli, and strengthening of quadriceps.    LAQ 12x each LE with reduction of friction to sole of shoe for activation    Balloon taps without back support: reaching inside and outside BOS to challenge trunk and core stability in a functional  range for pre-gait and transfers x 3 minutes   isometric adduction against PT hand 12x 3 second holds   Isometric abduction against PT hand 12x 3 second holds    Isometric hip flexion against PT hand 12x 3 second holds   GTB modified hamstring curl BLE 10x  Pt educated throughout session about proper posture and technique with exercises. Improved exercise technique, movement at target joints, use of target muscles after min to mod verbal, visual, tactile cues   Seated rest breaks and use of ice pack utilized to keep body temperature at functional range to reduce MS exacerbation.                     PT Education - 05/29/18 1352    Education provided  Yes    Education Details  exercise technique, postural control,     Person(s) Educated  Patient    Methods  Explanation;Demonstration;Tactile cues;Verbal cues    Comprehension  Verbalized understanding;Returned demonstration;Need further instruction;Tactile cues required;Verbal cues required       PT Short Term Goals - 04/11/18 1156      PT SHORT TERM GOAL #1   Title  Pt will be completed HEP with min assist from caregiver at least 4 days/wk for improved carryover between sessions    Baseline  HEP compliant     Time  2    Period  Weeks    Status  Achieved      PT SHORT TERM GOAL #2   Title  Patient will report a worst pain of 6/10 on VAS in cervical spine to improve tolerance with ADLs and reduced symptoms with activities.     Baseline  4/30: 9/10 5/21: 0/10 pain    Time  2    Period  Weeks    Status  Achieved      PT SHORT TERM GOAL #3   Title  Patient will perform cervical rotation at least 50 degrees, flexion and extension to 70 degrees, and sidebending to 40 degrees without pain    Baseline  4/30: R 50 L 34 Rotation, R 26 L 35 sb, Extension 35 flex 35 ; 5/21: extension 46, SB R 41 L 40, rotation R 62 L 61,     Time  2    Period  Weeks    Status  Achieved        PT Long Term Goals - 05/09/18 0001       PT LONG TERM GOAL #2   Title  Pt's 4mT will improve to at least 0.5 m/s for improved safety ambulating in home    Baseline  0.14 m/s; 5/15: .213m  7/10: .36 m/s  8/20: .42 m/s  9/26: 0.2638m10/14: .26 m/s improved hip and knee clearance 11/15: 0.80m84mmproved hip and knee clearance; 12/5: .29 m/s improved step length and forward moment: decreased foot drag. 05/09/2018: 0.21 m/s    Time  8    Period  Weeks    Status  Partially Met    Target Date  07/04/18      PT LONG TERM GOAL #3   Title  Pt's Bil hip F strength will improve to at least 2+/5 BLEs for improved gait mechanics and safety    Baseline  L: 2/5, R: 1/5 7/10: 2/5 bilaterally  8/20: 2/5 bilat 9/26: 2/5 bilat 10/14: 2/5 bilaterally ,improved hip flexor muscle activation 2/5: improved hip flexor and quad activation: 12/5: 2/5 ; 05/09/2018: 2/5    Time  8    Period  Weeks    Status  Partially Met    Target Date  07/04/18      PT LONG TERM GOAL #4   Title  Pt's ABC Scale will improve to at least 40% to demonstrate improved balance    Baseline  28.13% 5/15; 24.4% 7/10: 23.4% 8/20: 22. 2% 9/26: 25.3% 10/14: 26.5% 11/15: 24.38% 12/5: 26%; 05/09/2018: 29%    Time  6    Period  Weeks    Status  Partially Met    Target Date  07/04/18      PT LONG TERM GOAL #6   Title  Pt will be able to perform tub bench into/out of shower transfer without assist for improved independence with this task    Baseline  challenged with steps at this time, getting better, stands and gives sponge bath independently now  9/26: has not tried transfer yet 11/15: has not attempted yet; 05/09/2018: not attempted    Time  6    Period  Weeks    Status  Partially Met    Target Date  07/04/18      PT LONG TERM GOAL  #12   TITLE  Patient will negotiate one step ~4 inches to enter house independently to increase functional independence in natural environment.     Baseline  8/20: unable to do 9/26: patient too fatigued/not feeling well to perform 10/14: unable to  perform at this time 11/15: unable to perform at this time; 12/5: able to lift 1 inch; 05/09/2018: unable to clear 4" step, able to lift foot ~ 1in    Time  8    Period  Weeks    Status  On-going    Target Date  07/04/18      PT LONG TERM GOAL  #13   TITLE  Patient will perform STS from standard height surface to walker to allow patient to participate and transition in public areas when not in wheelchair.     Baseline  10/14: able to perform in W/C which is raised slightly 11/15: 20inch height 12/5: 18 inches from plinth table; 05/09/2018: 18 inches from plinth table    Time  8    Period  Weeks    Status  Partially Met    Target Date  07/04/18      PT LONG TERM GOAL  #14   TITLE  Patient will stand 5 minutes to increase standing duration to allow patient to return to washing dishes and clothes.     Baseline  12/5: 56 seconds; 05/09/2018: 1 min 18 sec    Time  8    Period  Weeks    Status  On-going    Target Date  07/04/18      PT LONG TERM GOAL  #15   TITLE  Patient will negotiate 30 ft of changing surfaces outside such as pavement and parking lot to allow patient to walk into church.     Baseline  12/5: does not walk outside    Time  8    Period  Weeks    Status  On-going    Target Date  07/04/18            Plan - 05/29/18 1353    Clinical Impression Statement  Patient session limited with transfers due to increased right wrist pain from carpal tunnel. Patient's ambulatory capacity continues to progress with functional duration and improved foot clearance and weight shift indicating increased strength of LE's. Patient will continue to benefit from skilled therapeutic intervention to address deficits in strength, monbility, balance, and function for improved overall QOL and safety.    Rehab Potential  Good    PT Frequency  2x / week    PT Duration  8 weeks    PT Treatment/Interventions  ADLs/Self Care Home Management;Aquatic Therapy;Biofeedback;Cryotherapy;Electrical  Stimulation;Iontophoresis 27m/ml Dexamethasone;Traction;Moist Heat;Ultrasound;DME Instruction;Gait training;Stair training;Functional mobility training;Therapeutic activities;Therapeutic exercise;Balance training;Neuromuscular re-education;Patient/family education;Orthotic Fit/Training;Wheelchair mobility training;Manual techniques;Compression bandaging;Passive range of motion;Dry needling;Energy conservation;Taping;Splinting    PT Next Visit Plan  continue strengthening/balance    PT Home Exercise Plan  to initiate next session (please incorporate hip flexor and adductor stretches in HEP if possible    Consulted and Agree with Plan of Care  Patient       Patient will benefit from skilled therapeutic intervention in order to improve the following deficits and impairments:  Abnormal gait, Decreased activity tolerance, Decreased balance, Decreased coordination, Decreased endurance, Decreased knowledge of use of DME, Decreased mobility, Decreased range of motion, Decreased safety awareness, Decreased strength, Difficulty walking, Impaired perceived functional ability, Impaired sensation, Impaired tone, Impaired UE functional use, Improper body mechanics, Postural dysfunction  Visit Diagnosis: Muscle weakness (generalized)  Other abnormalities of gait and mobility  Unsteadiness on feet     Problem List Patient Active Problem List   Diagnosis Date Noted  . Abscess of female pelvis   . SVT (supraventricular tachycardia) ([place]   . Radial styloid tenosynovitis 03/12/2018  . Wheelchair confinement 02/27/2018  . Localized osteoporosis with current pathological fracture with routine healing 01/19/2017  . Wrist fracture 01/16/2017  . Sprain of ankle 03/23/2016  . Closed fracture of lateral malleolus 03/16/2016  . Health care maintenance 01/24/2016  . Blood pressure elevated without history of HTN 10/25/2015  . Essential hypertension 10/25/2015  . Multiple sclerosis ([place] 10/02/2015  . Chronic  left shoulder pain 07/19/2015  . Multiple sclerosis exacerbation ([place] 07/14/2015  . MS ([place]) [place] 11/26/2014  . Increased body mass index 11/26/2014  . HPV test positive 11/26/2014  . Status post laparoscopic supracervical hysterectomy 11/26/2014  . Galactorrhea 11/26/2014  . Back ache 05/21/2014  . Adiposity 05/21/2014  . Disordered sleep 05/21/2014  . Muscle spasticity 05/21/2014  . Spasticity 05/21/2014  . Calculus of kidney 12/09/2013  . Renal colic 041/66/0630 . Hypercholesteremia 08/19/2013  . Hereditary and idiopathic neuropathy 08/19/2013  . Hypercholesterolemia without hypertriglyceridemia 08/19/2013  . Bladder infection, chronic 07/25/2012  . Disorder of bladder function 07/25/2012  . Incomplete bladder emptying 07/25/2012  . Microscopic hematuria 07/25/2012  . Right upper quadrant pain 07/25/2012   MJanna Arch PT, DPT   05/29/2018, 2:04 PM  [facility] [address] [address]Phone: [phone]  Fax:  [phone] Name: JHeidie KrallRamseur MRN: [id number] of Birth: [birth year]

## 2018-06-03 ENCOUNTER — Ambulatory Visit: Payer: Medicare HMO

## 2018-06-03 DIAGNOSIS — R2689 Other abnormalities of gait and mobility: Secondary | ICD-10-CM

## 2018-06-03 DIAGNOSIS — M6281 Muscle weakness (generalized): Secondary | ICD-10-CM | POA: Diagnosis not present

## 2018-06-03 DIAGNOSIS — R2681 Unsteadiness on feet: Secondary | ICD-10-CM

## 2018-06-03 NOTE — Therapy (Signed)
[place] MAIN [facility] SERVICES [address] [place], [place], [address] Phone: [phone]   Fax:  [phone]  Physical Therapy Treatment  Patient Details  Name: Lisa Williams MRN: [id number] Date of Birth: 04-Nov-1960 Referring Provider (PT): Gurney Maxin, MD   Encounter Date: 06/03/2018  PT End of Session - 06/03/18 1210    Visit Number  22    Number of Visits  94    Date for PT Re-Evaluation  07/04/18    Authorization Type   8/10 starting 05/09/2018    PT Start Time  1115    PT Stop Time  1200    PT Time Calculation (min)  45 min    Equipment Utilized During Treatment  Gait belt    Activity Tolerance  Patient limited by fatigue;Patient tolerated treatment well    Behavior During Therapy  [facility] for tasks assessed/performed       Past Medical History:  Diagnosis Date  . Abdominal pain, right upper quadrant   . Back pain   . Calculus of kidney 12/09/2013  . Chronic back pain    unspecified  . Chronic left shoulder pain 07/19/2015  . Functional disorder of bladder    other  . Galactorrhea 11/26/2014   Chronic   . Hereditary and idiopathic neuropathy 08/19/2013  . HPV test positive   . Hypercholesteremia 08/19/2013  . Incomplete bladder emptying   . Microscopic hematuria   . MS (multiple sclerosis) ([place])   . Muscle spasticity 05/21/2014  . Nonspecific findings on examination of urine    other  . Osteopenia   . Status post laparoscopic supracervical hysterectomy 11/26/2014  . Tobacco user 11/26/2014  . Wrist fracture     Past Surgical History:  Procedure Laterality Date  . bilateral tubal ligation  1996  . BREAST CYST EXCISION Left 2002  . KNEE SURGERY     right  . LAPAROSCOPIC SUPRACERVICAL HYSTERECTOMY  08/05/2013  . ORIF WRIST FRACTURE Left 01/17/2017   Procedure: OPEN REDUCTION INTERNAL FIXATION (ORIF) WRIST FRACTURE;  Surgeon: Lovell Sheehan, MD;  Location: [facility] ORS;  Service: Orthopedics;  Laterality: Left;  .  TUBAL LIGATION Bilateral   . VAGINAL HYSTERECTOMY  03/2006    There were no vitals filed for this visit.  Subjective Assessment - 06/03/18 1146    Subjective  Patient reports she got her new braces last week but feels like her left one may be a little off. Reports no falls or LOB.     Patient is accompained by:  Family member    Pertinent History  Pt is a 58 y/o F who presents with BLE weakness and difficulty ambulating due to h/o MS that was diagnosed in 1995. Pt denies any pain associated. Pt was admitted to the hospital in November 2018 due to MS exacerbation. Nov-Dec to [facility] until end of Jan. After rehab the pt was able to ambulate up to 100 ft with the RW. Pt has Bil AFO, her therapist at [facility] thinks she needs a new AFO as her feet still drag when she ambulates, pt reports HHPT mentioned a spring AFO. Pt has custom AFOs that were originally made 4 years ago. Goals: improve balance, to be able to get into the shower with her tub bench without assist, to be able to get up 2 steps at sister's home with railings on both sides but too far apart to reach both sides, to be able to drive adapted car. Pt is staying at other  sister's house with one step to get inside and currently picking up L leg with UEs and then stepping in. Pt steps down leading with LLE. Pt wants to work on walking side to side as this is challenging and she had not yet progressed to this at rehab. Pt able to get in/out the car with assist only to bring BLEs into the car. Hoping to drive by June or July with adapted car (already has one). Pt needs assist bringing LEs into and out of hospital bed. Pt able to perform sit>stand without assist from [facility] with a pillow on the seat for a boost. Pt denies any falls in the past 6 months. Pt has been increasing her time alone at home up to a few hours at a time to become more independent. Pt has both manual and power WC. Uses power WC at home, uses manual WC out in community. Pt  able to self propel manual WC. Pt needs assist putting pants around ankles but she can pull them up. Pt can put on her shoes but needs help getting her leg up to do so. Pt able to tie her shoelaces on her own. Pt has a tub/shower unit at home with a tub bench. No grab bars in the shower. Has hand held shower head. Pt currently taking a sponge bath. Pt has a regular height toilet with a BSC over top. Pt with h/o L wrist fx with no precautions, pt wore her soft brace to evaluation for comfort but reports her wrist has healed and is doing well following OT.     Limitations  Lifting;Standing;Walking;House hold activities    How long can you sit comfortably?  n/a    How long can you stand comfortably?  3 minutes without UE assist, limited by fatigue    How long can you walk comfortably?  100 ft, limited by fatigue    Patient Stated Goals  see above    Currently in Pain?  No/denies       Standing in // bars: Weight shift laterally to promote unweighting of limb for pregait technique. Patient requires cueing to decrease pull with UE's and have more shifting with hips.Initially requires Mod A at pelvis which decreased to CGA  Step over green band in // bars 5x each LE, clearing full width of band for five consecutive times for R, 3 consecutive times LLE.; CGA with verbal cueing for weight shift   Ambulate 48 ft with RW and CGA, improved step length and velocity with improved clearance of RLE. LLE dragged throughout ambulation. First time ambulating with this therapist with new AFOs  3000Gr weighted ball:   Flexion seated for core activation 10x  Twists seated 10x  Toss 10x with SPT: verbal cueing for keeping back away from    Seated balloon taps reaching inside and outsideBOS to promote postural strengthening.   Pt educated throughout session about proper posture and technique with exercises. Improved exercise technique, movement at target joints, use of target muscles after min to mod verbal,  visual, tactile cues  Use of ice to bring core temperature down to functional range.   new AFO: observed ambulation and transfers: noted less df of LLE, increased inversion of LLE also noted.                     PT Education - 06/03/18 1147    Education provided  Yes    Education Details  exercise technique, new AFO  Person(s) Educated  Patient    Methods  Explanation;Demonstration;Tactile cues;Verbal cues    Comprehension  Verbalized understanding;Returned demonstration;Tactile cues required;Need further instruction;Verbal cues required       PT Short Term Goals - 04/11/18 1156      PT SHORT TERM GOAL #1   Title  Pt will be completed HEP with min assist from caregiver at least 4 days/wk for improved carryover between sessions    Baseline  HEP compliant     Time  2    Period  Weeks    Status  Achieved      PT SHORT TERM GOAL #2   Title  Patient will report a worst pain of 6/10 on VAS in cervical spine to improve tolerance with ADLs and reduced symptoms with activities.     Baseline  4/30: 9/10 5/21: 0/10 pain    Time  2    Period  Weeks    Status  Achieved      PT SHORT TERM GOAL #3   Title  Patient will perform cervical rotation at least 50 degrees, flexion and extension to 70 degrees, and sidebending to 40 degrees without pain    Baseline  4/30: R 50 L 34 Rotation, R 26 L 35 sb, Extension 35 flex 35 ; 5/21: extension 46, SB R 41 L 40, rotation R 62 L 61,     Time  2    Period  Weeks    Status  Achieved        PT Long Term Goals - 05/09/18 0001      PT LONG TERM GOAL #2   Title  Pt's 55mT will improve to at least 0.5 m/s for improved safety ambulating in home    Baseline  0.14 m/s; 5/15: .259m  7/10: .36 m/s  8/20: .42 m/s  9/26: 0.2665m10/14: .26 m/s improved hip and knee clearance 11/15: 0.59m57mmproved hip and knee clearance; 12/5: .29 m/s improved step length and forward moment: decreased foot drag. 05/09/2018: 0.21 m/s    Time  8    Period   Weeks    Status  Partially Met    Target Date  07/04/18      PT LONG TERM GOAL #3   Title  Pt's Bil hip F strength will improve to at least 2+/5 BLEs for improved gait mechanics and safety    Baseline  L: 2/5, R: 1/5 7/10: 2/5 bilaterally  8/20: 2/5 bilat 9/26: 2/5 bilat 10/14: 2/5 bilaterally ,improved hip flexor muscle activation 2/5: improved hip flexor and quad activation: 12/5: 2/5 ; 05/09/2018: 2/5    Time  8    Period  Weeks    Status  Partially Met    Target Date  07/04/18      PT LONG TERM GOAL #4   Title  Pt's ABC Scale will improve to at least 40% to demonstrate improved balance    Baseline  28.13% 5/15; 24.4% 7/10: 23.4% 8/20: 22. 2% 9/26: 25.3% 10/14: 26.5% 11/15: 24.38% 12/5: 26%; 05/09/2018: 29%    Time  6    Period  Weeks    Status  Partially Met    Target Date  07/04/18      PT LONG TERM GOAL #6   Title  Pt will be able to perform tub bench into/out of shower transfer without assist for improved independence with this task    Baseline  challenged with steps at this time, getting better, stands and gives sponge bath independently now  9/26: has not tried transfer yet 11/15: has not attempted yet; 05/09/2018: not attempted    Time  6    Period  Weeks    Status  Partially Met    Target Date  07/04/18      PT LONG TERM GOAL  #12   TITLE  Patient will negotiate one step ~4 inches to enter house independently to increase functional independence in natural environment.     Baseline  8/20: unable to do 9/26: patient too fatigued/not feeling well to perform 10/14: unable to perform at this time 11/15: unable to perform at this time; 12/5: able to lift 1 inch; 05/09/2018: unable to clear 4" step, able to lift foot ~ 1in    Time  8    Period  Weeks    Status  On-going    Target Date  07/04/18      PT LONG TERM GOAL  #13   TITLE  Patient will perform STS from standard height surface to walker to allow patient to participate and transition in public areas when not in wheelchair.      Baseline  10/14: able to perform in W/C which is raised slightly 11/15: 20inch height 12/5: 18 inches from plinth table; 05/09/2018: 18 inches from plinth table    Time  8    Period  Weeks    Status  Partially Met    Target Date  07/04/18      PT LONG TERM GOAL  #14   TITLE  Patient will stand 5 minutes to increase standing duration to allow patient to return to washing dishes and clothes.     Baseline  12/5: 56 seconds; 05/09/2018: 1 min 18 sec    Time  8    Period  Weeks    Status  On-going    Target Date  07/04/18      PT LONG TERM GOAL  #15   TITLE  Patient will negotiate 30 ft of changing surfaces outside such as pavement and parking lot to allow patient to walk into church.     Baseline  12/5: does not walk outside    Time  8    Period  Weeks    Status  On-going    Target Date  07/04/18            Plan - 06/03/18 1757    Clinical Impression Statement  Patient presents with new AFO's to today's session. Increased foot drag of LLE noted with new AFOs during ambulation. Patient continues to progress with standing interventions however was slightly more unstable this session due to fear of LOB in new AFO's. Patient will continue to benefit from skilled therapeutic intervention to address deficits in strength, monbility, balance, and function for improved overall QOL and safety    Rehab Potential  Good    PT Frequency  2x / week    PT Duration  8 weeks    PT Treatment/Interventions  ADLs/Self Care Home Management;Aquatic Therapy;Biofeedback;Cryotherapy;Electrical Stimulation;Iontophoresis 5m/ml Dexamethasone;Traction;Moist Heat;Ultrasound;DME Instruction;Gait training;Stair training;Functional mobility training;Therapeutic activities;Therapeutic exercise;Balance training;Neuromuscular re-education;Patient/family education;Orthotic Fit/Training;Wheelchair mobility training;Manual techniques;Compression bandaging;Passive range of motion;Dry needling;Energy conservation;Taping;Splinting     PT Next Visit Plan  continue strengthening/balance    PT Home Exercise Plan  to initiate next session (please incorporate hip flexor and adductor stretches in HEP if possible    Consulted and Agree with Plan of Care  Patient       Patient will benefit from skilled therapeutic intervention in order to improve  the following deficits and impairments:  Abnormal gait, Decreased activity tolerance, Decreased balance, Decreased coordination, Decreased endurance, Decreased knowledge of use of DME, Decreased mobility, Decreased range of motion, Decreased safety awareness, Decreased strength, Difficulty walking, Impaired perceived functional ability, Impaired sensation, Impaired tone, Impaired UE functional use, Improper body mechanics, Postural dysfunction  Visit Diagnosis: Muscle weakness (generalized)  Other abnormalities of gait and mobility  Unsteadiness on feet     Problem List Patient Active Problem List   Diagnosis Date Noted  . Abscess of female pelvis   . SVT (supraventricular tachycardia) ([place])   . Radial styloid tenosynovitis 03/12/2018  . Wheelchair confinement 02/27/2018  . Localized osteoporosis with current pathological fracture with routine healing 01/19/2017  . Wrist fracture 01/16/2017  . Sprain of ankle 03/23/2016  . Closed fracture of lateral malleolus 03/16/2016  . Health care maintenance 01/24/2016  . Blood pressure elevated without history of HTN 10/25/2015  . Essential hypertension 10/25/2015  . Multiple sclerosis ([place]) 10/02/2015  . Chronic left shoulder pain 07/19/2015  . Multiple sclerosis exacerbation ([place]) 07/14/2015  . MS (multiple sclerosis) ([place]) 11/26/2014  . Increased body mass index 11/26/2014  . HPV test positive 11/26/2014  . Status post laparoscopic supracervical hysterectomy 11/26/2014  . Galactorrhea 11/26/2014  . Back ache 05/21/2014  . Adiposity 05/21/2014  . Disordered sleep 05/21/2014  . Muscle spasticity 05/21/2014  . Spasticity  05/21/2014  . Calculus of kidney 12/09/2013  . Renal colic 75/61/2548  . Hypercholesteremia 08/19/2013  . Hereditary and idiopathic neuropathy 08/19/2013  . Hypercholesterolemia without hypertriglyceridemia 08/19/2013  . Bladder infection, chronic 07/25/2012  . Disorder of bladder function 07/25/2012  . Incomplete bladder emptying 07/25/2012  . Microscopic hematuria 07/25/2012  . Right upper quadrant pain 07/25/2012   Janna Arch, PT, DPT   06/03/2018, 5:57 PM  [place] MAIN [facility] SERVICES [address] [place], [place], [address] Phone: [phone]   Fax:  [phone]  Name: Lisa Williams MRN: [id number] Date of Birth: 04-20-1961

## 2018-06-05 ENCOUNTER — Ambulatory Visit: Payer: Medicare HMO

## 2018-06-05 DIAGNOSIS — R2689 Other abnormalities of gait and mobility: Secondary | ICD-10-CM

## 2018-06-05 DIAGNOSIS — M6281 Muscle weakness (generalized): Secondary | ICD-10-CM | POA: Diagnosis not present

## 2018-06-05 DIAGNOSIS — R2681 Unsteadiness on feet: Secondary | ICD-10-CM

## 2018-06-05 NOTE — Therapy (Signed)
[place] MAIN [facility] SERVICES [address] [place], [place], [address] Phone: [phone]   Fax:  [phone]  Physical Therapy Treatment  Patient Details  Name: Lisa Williams MRN: [id number] Date of Birth: 1960-06-04 Referring Provider (PT): Gurney Maxin, MD   Encounter Date: 06/05/2018  PT End of Session - 06/05/18 1216    Visit Number  72    Number of Visits  94    Date for PT Re-Evaluation  07/04/18    Authorization Type   9/10 starting 05/09/2018    PT Start Time  1115    PT Stop Time  1202    PT Time Calculation (min)  47 min    Equipment Utilized During Treatment  Gait belt    Activity Tolerance  Patient limited by fatigue;Patient tolerated treatment well    Behavior During Therapy  [facility] for tasks assessed/performed       Past Medical History:  Diagnosis Date  . Abdominal pain, right upper quadrant   . Back pain   . Calculus of kidney 12/09/2013  . Chronic back pain    unspecified  . Chronic left shoulder pain 07/19/2015  . Functional disorder of bladder    other  . Galactorrhea 11/26/2014   Chronic   . Hereditary and idiopathic neuropathy 08/19/2013  . HPV test positive   . Hypercholesteremia 08/19/2013  . Incomplete bladder emptying   . Microscopic hematuria   . MS (multiple sclerosis) ([place])   . Muscle spasticity 05/21/2014  . Nonspecific findings on examination of urine    other  . Osteopenia   . Status post laparoscopic supracervical hysterectomy 11/26/2014  . Tobacco user 11/26/2014  . Wrist fracture     Past Surgical History:  Procedure Laterality Date  . bilateral tubal ligation  1996  . BREAST CYST EXCISION Left 2002  . KNEE SURGERY     right  . LAPAROSCOPIC SUPRACERVICAL HYSTERECTOMY  08/05/2013  . ORIF WRIST FRACTURE Left 01/17/2017   Procedure: OPEN REDUCTION INTERNAL FIXATION (ORIF) WRIST FRACTURE;  Surgeon: Lovell Sheehan, MD;  Location: [facility] ORS;  Service: Orthopedics;  Laterality: Left;  .  TUBAL LIGATION Bilateral   . VAGINAL HYSTERECTOMY  03/2006    There were no vitals filed for this visit.  Subjective Assessment - 06/05/18 1132    Subjective  Patient will get her AFOs re-fitted tomorrow. Patient had a rough night for her mom's birthday the other day.     Patient is accompained by:  Family member    Pertinent History  Pt is a 58 y/o F who presents with BLE weakness and difficulty ambulating due to h/o MS that was diagnosed in 1995. Pt denies any pain associated. Pt was admitted to the hospital in November 2018 due to MS exacerbation. Nov-Dec to HHPT-->[facility] until end of Jan. After rehab the pt was able to ambulate up to 100 ft with the RW. Pt has Bil AFO, her therapist at [facility] thinks she needs a new AFO as her feet still drag when she ambulates, pt reports HHPT mentioned a spring AFO. Pt has custom AFOs that were originally made 4 years ago. Goals: improve balance, to be able to get into the shower with her tub bench without assist, to be able to get up 2 steps at sister's home with railings on both sides but too far apart to reach both sides, to be able to drive adapted car. Pt is staying at other sister's house with one step to  get inside and currently picking up L leg with UEs and then stepping in. Pt steps down leading with LLE. Pt wants to work on walking side to side as this is challenging and she had not yet progressed to this at rehab. Pt able to get in/out the car with assist only to bring BLEs into the car. Hoping to drive by June or July with adapted car (already has one). Pt needs assist bringing LEs into and out of hospital bed. Pt able to perform sit>stand without assist from [facility] with a pillow on the seat for a boost. Pt denies any falls in the past 6 months. Pt has been increasing her time alone at home up to a few hours at a time to become more independent. Pt has both manual and power WC. Uses power WC at home, uses manual WC out in community. Pt able to self  propel manual WC. Pt needs assist putting pants around ankles but she can pull them up. Pt can put on her shoes but needs help getting her leg up to do so. Pt able to tie her shoelaces on her own. Pt has a tub/shower unit at home with a tub bench. No grab bars in the shower. Has hand held shower head. Pt currently taking a sponge bath. Pt has a regular height toilet with a BSC over top. Pt with h/o L wrist fx with no precautions, pt wore her soft brace to evaluation for comfort but reports her wrist has healed and is doing well following OT.     Limitations  Lifting;Standing;Walking;House hold activities    How long can you sit comfortably?  n/a    How long can you stand comfortably?  3 minutes without UE assist, limited by fatigue    How long can you walk comfortably?  100 ft, limited by fatigue    Patient Stated Goals  see above    Currently in Pain?  No/denies       TREATMENT:   // bars: weight shift L and R with focus on hip momentum rather than usage of UE for pre-gait interventions.   Weight shift with knee bends: 2 minutes CGA, // bars, improved stability with decreased reliance upon UE's for weight shift. Fatigue towards end of set.   standing weight shift with step x8 each leg. Improved step length with weight shift noted. CGA, fatigue noted requiring sitting rest break between legs.   Sit to stands: UE support with decreasing UE support and increasing utilization of LE's with repetition and focus.   Standing tolerance; 2 min 52 seconds. CGA, shaking of extremities with fatigue. BUE support   Seated:  Gluteal squeezes 10x 3 second holds   3000Gr weighted ball:              Flexion seated for core activation 10x             Twists seated 10x Seated balloon taps reaching inside and outsideBOS to promote postural strengthening.  Scapular retractions 15x for upright posture in seated position.    Getting into car with RW and CGA. Requires Min A for placement of LLE on side of  car for positioning.    Pt educated throughout session about proper posture and technique with exercises. Improved exercise technique, movement at target joints, use of target muscles after min to mod verbal, visual, tactile cues  Use of ice to bring core temperature down to functional range.  PT Education - 06/05/18 1215    Education provided  Yes    Education Details  exercise technique, new AFO usage    Person(s) Educated  Patient    Methods  Explanation;Demonstration;Tactile cues;Verbal cues    Comprehension  Verbalized understanding;Returned demonstration;Tactile cues required;Need further instruction;Verbal cues required       PT Short Term Goals - 04/11/18 1156      PT SHORT TERM GOAL #1   Title  Pt will be completed HEP with min assist from caregiver at least 4 days/wk for improved carryover between sessions    Baseline  HEP compliant     Time  2    Period  Weeks    Status  Achieved      PT SHORT TERM GOAL #2   Title  Patient will report a worst pain of 6/10 on VAS in cervical spine to improve tolerance with ADLs and reduced symptoms with activities.     Baseline  4/30: 9/10 5/21: 0/10 pain    Time  2    Period  Weeks    Status  Achieved      PT SHORT TERM GOAL #3   Title  Patient will perform cervical rotation at least 50 degrees, flexion and extension to 70 degrees, and sidebending to 40 degrees without pain    Baseline  4/30: R 50 L 34 Rotation, R 26 L 35 sb, Extension 35 flex 35 ; 5/21: extension 46, SB R 41 L 40, rotation R 62 L 61,     Time  2    Period  Weeks    Status  Achieved        PT Long Term Goals - 05/09/18 0001      PT LONG TERM GOAL #2   Title  Pt's 50mT will improve to at least 0.5 m/s for improved safety ambulating in home    Baseline  0.14 m/s; 5/15: .229m  7/10: .36 m/s  8/20: .42 m/s  9/26: 0.2635m10/14: .26 m/s improved hip and knee clearance 11/15: 0.70m70mmproved hip and knee clearance; 12/5:  .29 m/s improved step length and forward moment: decreased foot drag. 05/09/2018: 0.21 m/s    Time  8    Period  Weeks    Status  Partially Met    Target Date  07/04/18      PT LONG TERM GOAL #3   Title  Pt's Bil hip F strength will improve to at least 2+/5 BLEs for improved gait mechanics and safety    Baseline  L: 2/5, R: 1/5 7/10: 2/5 bilaterally  8/20: 2/5 bilat 9/26: 2/5 bilat 10/14: 2/5 bilaterally ,improved hip flexor muscle activation 2/5: improved hip flexor and quad activation: 12/5: 2/5 ; 05/09/2018: 2/5    Time  8    Period  Weeks    Status  Partially Met    Target Date  07/04/18      PT LONG TERM GOAL #4   Title  Pt's ABC Scale will improve to at least 40% to demonstrate improved balance    Baseline  28.13% 5/15; 24.4% 7/10: 23.4% 8/20: 22. 2% 9/26: 25.3% 10/14: 26.5% 11/15: 24.38% 12/5: 26%; 05/09/2018: 29%    Time  6    Period  Weeks    Status  Partially Met    Target Date  07/04/18      PT LONG TERM GOAL #6   Title  Pt will be able to perform tub bench into/out of shower transfer without assist  for improved independence with this task    Baseline  challenged with steps at this time, getting better, stands and gives sponge bath independently now  9/26: has not tried transfer yet 11/15: has not attempted yet; 05/09/2018: not attempted    Time  6    Period  Weeks    Status  Partially Met    Target Date  07/04/18      PT LONG TERM GOAL  #12   TITLE  Patient will negotiate one step ~4 inches to enter house independently to increase functional independence in natural environment.     Baseline  8/20: unable to do 9/26: patient too fatigued/not feeling well to perform 10/14: unable to perform at this time 11/15: unable to perform at this time; 12/5: able to lift 1 inch; 05/09/2018: unable to clear 4" step, able to lift foot ~ 1in    Time  8    Period  Weeks    Status  On-going    Target Date  07/04/18      PT LONG TERM GOAL  #13   TITLE  Patient will perform STS from standard  height surface to walker to allow patient to participate and transition in public areas when not in wheelchair.     Baseline  10/14: able to perform in W/C which is raised slightly 11/15: 20inch height 12/5: 18 inches from plinth table; 05/09/2018: 18 inches from plinth table    Time  8    Period  Weeks    Status  Partially Met    Target Date  07/04/18      PT LONG TERM GOAL  #14   TITLE  Patient will stand 5 minutes to increase standing duration to allow patient to return to washing dishes and clothes.     Baseline  12/5: 56 seconds; 05/09/2018: 1 min 18 sec    Time  8    Period  Weeks    Status  On-going    Target Date  07/04/18      PT LONG TERM GOAL  #15   TITLE  Patient will negotiate 30 ft of changing surfaces outside such as pavement and parking lot to allow patient to walk into church.     Baseline  12/5: does not walk outside    Time  8    Period  Weeks    Status  On-going    Target Date  07/04/18            Plan - 06/05/18 1221    Clinical Impression Statement  Patient presented with increased fatigue to this session due to being busy and up late yesterday from her mom's birthday. New AFO results in increased drag of LLE than with older AFO braces. Patient will continue to benefit from skilled therapeutic intervention to address deficits in strength, monbility, balance, and function for improved overall QOL and safety    Rehab Potential  Good    PT Frequency  2x / week    PT Duration  8 weeks    PT Treatment/Interventions  ADLs/Self Care Home Management;Aquatic Therapy;Biofeedback;Cryotherapy;Electrical Stimulation;Iontophoresis '4mg'$ /ml Dexamethasone;Traction;Moist Heat;Ultrasound;DME Instruction;Gait training;Stair training;Functional mobility training;Therapeutic activities;Therapeutic exercise;Balance training;Neuromuscular re-education;Patient/family education;Orthotic Fit/Training;Wheelchair mobility training;Manual techniques;Compression bandaging;Passive range of  motion;Dry needling;Energy conservation;Taping;Splinting    PT Next Visit Plan  continue strengthening/balance    PT Home Exercise Plan  to initiate next session (please incorporate hip flexor and adductor stretches in HEP if possible    Consulted and Agree with Plan of Care  Patient       Patient will benefit from skilled therapeutic intervention in order to improve the following deficits and impairments:  Abnormal gait, Decreased activity tolerance, Decreased balance, Decreased coordination, Decreased endurance, Decreased knowledge of use of DME, Decreased mobility, Decreased range of motion, Decreased safety awareness, Decreased strength, Difficulty walking, Impaired perceived functional ability, Impaired sensation, Impaired tone, Impaired UE functional use, Improper body mechanics, Postural dysfunction  Visit Diagnosis: Muscle weakness (generalized)  Other abnormalities of gait and mobility  Unsteadiness on feet     Problem List Patient Active Problem List   Diagnosis Date Noted  . Abscess of female pelvis   . SVT (supraventricular tachycardia) ([place])   . Radial styloid tenosynovitis 03/12/2018  . Wheelchair confinement 02/27/2018  . Localized osteoporosis with current pathological fracture with routine healing 01/19/2017  . Wrist fracture 01/16/2017  . Sprain of ankle 03/23/2016  . Closed fracture of lateral malleolus 03/16/2016  . Health care maintenance 01/24/2016  . Blood pressure elevated without history of HTN 10/25/2015  . Essential hypertension 10/25/2015  . Multiple sclerosis ([place]) 10/02/2015  . Chronic left shoulder pain 07/19/2015  . Multiple sclerosis exacerbation ([place]) 07/14/2015  . MS (multiple sclerosis) ([place]) 11/26/2014  . Increased body mass index 11/26/2014  . HPV test positive 11/26/2014  . Status post laparoscopic supracervical hysterectomy 11/26/2014  . Galactorrhea 11/26/2014  . Back ache 05/21/2014  . Adiposity 05/21/2014  . Disordered sleep  05/21/2014  . Muscle spasticity 05/21/2014  . Spasticity 05/21/2014  . Calculus of kidney 12/09/2013  . Renal colic 49/44/9675  . Hypercholesteremia 08/19/2013  . Hereditary and idiopathic neuropathy 08/19/2013  . Hypercholesterolemia without hypertriglyceridemia 08/19/2013  . Bladder infection, chronic 07/25/2012  . Disorder of bladder function 07/25/2012  . Incomplete bladder emptying 07/25/2012  . Microscopic hematuria 07/25/2012  . Right upper quadrant pain 07/25/2012   Janna Arch, PT, DPT   06/05/2018, 12:23 PM  [place] MAIN [facility] SERVICES [address] [place], [place], [address] Phone: [phone]   Fax:  [phone]  Name: Lisa Williams MRN: [id number] Date of Birth: 12/29/1960

## 2018-06-11 ENCOUNTER — Ambulatory Visit: Payer: Medicare HMO

## 2018-06-12 ENCOUNTER — Other Ambulatory Visit: Payer: Self-pay | Admitting: Hematology and Oncology

## 2018-06-12 ENCOUNTER — Inpatient Hospital Stay: Payer: Medicare HMO | Attending: Hematology and Oncology | Admitting: Hematology and Oncology

## 2018-06-12 ENCOUNTER — Encounter: Payer: Self-pay | Admitting: Hematology and Oncology

## 2018-06-12 ENCOUNTER — Inpatient Hospital Stay: Payer: Medicare HMO

## 2018-06-12 VITALS — BP 151/83 | HR 67 | Temp 97.0°F | Resp 18 | Ht 71.0 in | Wt 269.6 lb

## 2018-06-12 VITALS — BP 172/81 | HR 87 | Temp 97.6°F | Resp 20

## 2018-06-12 DIAGNOSIS — Z5112 Encounter for antineoplastic immunotherapy: Secondary | ICD-10-CM | POA: Insufficient documentation

## 2018-06-12 DIAGNOSIS — G35 Multiple sclerosis: Secondary | ICD-10-CM | POA: Diagnosis not present

## 2018-06-12 DIAGNOSIS — D649 Anemia, unspecified: Secondary | ICD-10-CM

## 2018-06-12 DIAGNOSIS — R718 Other abnormality of red blood cells: Secondary | ICD-10-CM

## 2018-06-12 MED ORDER — DIPHENHYDRAMINE HCL 25 MG PO CAPS
50.0000 mg | ORAL_CAPSULE | Freq: Once | ORAL | Status: AC
  Administered 2018-06-12: 50 mg via ORAL

## 2018-06-12 MED ORDER — METHYLPREDNISOLONE SODIUM SUCC 125 MG IJ SOLR
100.0000 mg | Freq: Once | INTRAMUSCULAR | Status: AC
  Administered 2018-06-12: 100 mg via INTRAVENOUS
  Filled 2018-06-12: qty 2

## 2018-06-12 MED ORDER — DIPHENHYDRAMINE HCL 25 MG PO CAPS
ORAL_CAPSULE | ORAL | Status: AC
  Filled 2018-06-12: qty 2

## 2018-06-12 MED ORDER — ACETAMINOPHEN 500 MG PO TABS
ORAL_TABLET | ORAL | Status: AC
  Filled 2018-06-12: qty 2

## 2018-06-12 MED ORDER — ACETAMINOPHEN 500 MG PO TABS
1000.0000 mg | ORAL_TABLET | Freq: Once | ORAL | Status: AC
  Administered 2018-06-12: 1000 mg via ORAL

## 2018-06-12 MED ORDER — SODIUM CHLORIDE 0.9 % IV SOLN
1000.0000 mg | Freq: Once | INTRAVENOUS | Status: AC
  Administered 2018-06-12: 1000 mg via INTRAVENOUS
  Filled 2018-06-12: qty 100

## 2018-06-12 MED ORDER — SODIUM CHLORIDE 0.9 % IV SOLN
Freq: Once | INTRAVENOUS | Status: AC
  Administered 2018-06-12: 11:00:00 via INTRAVENOUS
  Filled 2018-06-12: qty 250

## 2018-06-12 NOTE — Progress Notes (Addendum)
[facility]  Clinic day:  06/12/2018  Chief Complaint: Lisa Williams is a 58 y.o. female with multiple sclerosis who seen for 6 month assessment prior to cycle #5 Rituxan.  HPI:  Patient was last seen in the hematology clinic on 11/07/2017.  At that time, she was doing better.  She was back at home and participating in outpatient physical therapy.  Exam reveals improvement in distal lower extremity strength since last visit. She received Rituxan.  She was admitted to [facility] from 04/15/2018 - 04/19/2018 with generalized weakness.  Weakness felt due to infection.  She had a boil in the left groin area.  IV antibiotics were switched to oral doxycycline and IV clindamycin.  She was discharged on 4 days of cephalexin.  Head MRI on 04/17/2018 revealed multiple white matter hyperintensities including radial periventricular and juxta cortical lesions compatible with history of multiple sclerosis. There was no enhancement identified to suggest an active process.  Lumbar spine MRI on 04/17/2018 revealed no acute osseous abnormality or malalignment.  There was mild-to-moderate lumbar spondylosis predominantly at L3-S1.  There was mild L3-4, moderate L4-5, and mild L5-S1 foraminal and spinal canal stenosis.  CBC on 04/19/2018 revealed a hematocrit of 36.9, hemoglobin 12.7, MCV 76.7, platelets 259,000, WBC 8000.  Creatinine was 0.64.  During the interim, patient is doing well overall. She denies any acute concerns. She denies any progressive neurological symptoms related to her primary MS diagnosis. Patient was seen in follow up by Dr. [name] on 06/10/2018; notes reviewed. Patient to move to an every 8 month Rituxan infusion schedule. Patient has been working with physical therapy and meeting expected goals per her report. Patient has improved overall with regards to returning to her PLOF. Patient denies that she has experienced any B symptoms. She denies any interval  infections.   Patient advises that she maintains an adequate appetite. She is eating well. Weight today is 269 lb 9.6 oz (122.3 kg), which compared to her last visit to the clinic, represents a 9 pound increase.     Patient denies pain in the clinic today.   Past Medical History:  Diagnosis Date  . Abdominal pain, right upper quadrant   . Back pain   . Calculus of kidney 12/09/2013  . Chronic back pain    unspecified  . Chronic left shoulder pain 07/19/2015  . Functional disorder of bladder    other  . Galactorrhea 11/26/2014   Chronic   . Hereditary and idiopathic neuropathy 08/19/2013  . HPV test positive   . Hypercholesteremia 08/19/2013  . Incomplete bladder emptying   . Microscopic hematuria   . MS (multiple sclerosis) (HCC)   . Muscle spasticity 05/21/2014  . Nonspecific findings on examination of urine    other  . Osteopenia   . Status post laparoscopic supracervical hysterectomy 11/26/2014  . Tobacco user 11/26/2014  . Wrist fracture     Past Surgical History:  Procedure Laterality Date  . bilateral tubal ligation  1996  . BREAST CYST EXCISION Left 2002  . KNEE SURGERY     right  . LAPAROSCOPIC SUPRACERVICAL HYSTERECTOMY  08/05/2013  . ORIF WRIST FRACTURE Left 01/17/2017   Procedure: OPEN REDUCTION INTERNAL FIXATION (ORIF) WRIST FRACTURE;  Surgeon: Lyndle Herrlich, MD;  Location: [facility] ORS;  Service: Orthopedics;  Laterality: Left;  . TUBAL LIGATION Bilateral   . VAGINAL HYSTERECTOMY  03/2006    Family History  Problem Relation Age of Onset  . Sickle cell trait Sister   .  Hypertension Sister   . Supraventricular tachycardia Sister   . Hypertension Sister   . Diabetes Maternal Grandmother   . Liver cancer Maternal Grandmother   . Diabetes Maternal Aunt   . Breast cancer Maternal Aunt        great MAT  . Ovarian cancer Paternal Grandmother   . Nephrolithiasis Father   . Hypertension Father   . Prostate cancer Father   . Kidney disease Father   . Heart failure  Father   . Hypertension Sister   . Osteoarthritis Mother   . Hypertension Mother   . Hyperlipidemia Mother   . GU problems Neg Hx   . Urolithiasis Neg Hx     Social History:  reports that she has quit smoking. Her smoking use included cigarettes. She smoked 0.50 packs per day. She has never used smokeless tobacco. She reports that she does not drink alcohol or use drugs.  She lives in [place].  She is disabled secondary to her multiple sclerosis.  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  She has been in the [facility] since 04/23/2017.  The patient is accompanied by her sister, [name], today.  Allergies:  Allergies  Allergen Reactions  . Amlodipine Other (See Comments)    edema  . Povidone Iodine Rash    Current Medications: Current Outpatient Medications  Medication Sig Dispense Refill  . alendronate (FOSAMAX) 70 MG tablet Take 70 mg by mouth once a week. SUNDAYS    . baclofen (LIORESAL) 20 MG tablet Take 20 mg by mouth 3 (three) times daily.     . Cholecalciferol (EQL VITAMIN D3) 25 MCG (1000 UT) tablet Take 2,000 Units by mouth daily.    Marland Kitchen oxybutynin (DITROPAN-XL) 10 MG 24 hr tablet Take 1 tablet (10 mg total) by mouth daily. 90 tablet 3  . telmisartan (MICARDIS) 40 MG tablet Take 40 mg by mouth daily.     No current facility-administered medications for this visit.     Review of Systems  Constitutional: Negative for diaphoresis, fever, malaise/fatigue and weight loss (up 9 pounds).       Doing well.  HENT: Negative.  Negative for congestion, ear pain, nosebleeds, sinus pain and sore throat.   Eyes: Negative.  Negative for blurred vision, double vision and photophobia.  Respiratory: Negative.  Negative for cough, hemoptysis, sputum production and shortness of breath.   Cardiovascular: Negative.  Negative for chest pain, palpitations, orthopnea, leg swelling and PND.  Gastrointestinal: Negative.  Negative for abdominal  pain, blood in stool, constipation, diarrhea, melena, nausea and vomiting.  Genitourinary: Negative.  Negative for dysuria, frequency, hematuria and urgency.  Musculoskeletal: Negative.  Negative for back pain, falls, joint pain, myalgias and neck pain.  Skin: Negative.  Negative for itching and rash.  Neurological: Positive for weakness (mainly lower extremities related to MS). Negative for dizziness, tremors, speech change, focal weakness and headaches.       Multiple sclerosis.  Endo/Heme/Allergies: Negative.  Does not bruise/bleed easily.  Psychiatric/Behavioral: Negative.  Negative for depression and memory loss. The patient is not nervous/anxious and does not have insomnia.   All other systems reviewed and are negative.  Performance status (ECOG): 3  Physical Exam: Blood pressure (!) 151/83, pulse 67, temperature (!) 97 F (36.1 C), temperature source Tympanic, resp. rate 18, height 5\' 11"  (1.803 m), weight 269 lb 9.6 oz (122.3 kg). GENERAL:  Well developed, well nourished, woman sitting comfortably in the exam room in no acute  distress. MENTAL STATUS:  Alert and oriented to person, place and time. HEAD:  Curly dark short hair.  Normocephalic, atraumatic, face symmetric, no Cushingoid features. EYES:  Glasses.  Brown eyes.  Pupils equal round and reactive to light and accomodation.  No conjunctivitis or scleral icterus. ENT:  Oropharynx clear without lesion.  Tongue normal. Mucous membranes moist.  RESPIRATORY:  Clear to auscultation without rales, wheezes or rhonchi. CARDIOVASCULAR:  Regular rate and rhythm without murmur, rub or gallop. ABDOMEN:  Soft, non-tender, with active bowel sounds, and no hepatosplenomegaly.  No masses. SKIN:  No rashes, ulcers or lesions. EXTREMITIES:  Braces in place.  No edema, no skin discoloration or tenderness.  No palpable cords. LYMPH NODES: No palpable cervical, supraclavicular, axillary or inguinal adenopathy  NEUROLOGICAL: Alert & oriented,  cranial nerves II-XII intact; motor strength 5/5 upper extremities and 3/5 bilateral proximal lower extremities; sensation intact; finger to nose and RAM normal; bilateral lower extremity DTR 2+.  PSYCH:  Appropriate.    No visits with results within 3 Day(s) from this visit.  Latest known visit with results is:  Admission on 04/15/2018, Discharged on 04/19/2018  Component Date Value Ref Range Status  . WBC 04/15/2018 11.0* 4.0 - 10.5 K/uL Final  . RBC 04/15/2018 5.08  3.87 - 5.11 MIL/uL Final  . Hemoglobin 04/15/2018 13.4  12.0 - 15.0 g/dL Final  . HCT 16/02/9603 38.7  36.0 - 46.0 % Final  . MCV 04/15/2018 76.2* 80.0 - 100.0 fL Final  . MCH 04/15/2018 26.4  26.0 - 34.0 pg Final  . MCHC 04/15/2018 34.6  30.0 - 36.0 g/dL Final  . RDW 54/01/8118 13.9  11.5 - 15.5 % Final  . Platelets 04/15/2018 247  150 - 400 K/uL Final  . nRBC 04/15/2018 0.0  0.0 - 0.2 % Final  . Neutrophils Relative % 04/15/2018 75  % Final  . Neutro Abs 04/15/2018 8.3* 1.7 - 7.7 K/uL Final  . Lymphocytes Relative 04/15/2018 14  % Final  . Lymphs Abs 04/15/2018 1.5  0.7 - 4.0 K/uL Final  . Monocytes Relative 04/15/2018 9  % Final  . Monocytes Absolute 04/15/2018 1.0  0.1 - 1.0 K/uL Final  . Eosinophils Relative 04/15/2018 1  % Final  . Eosinophils Absolute 04/15/2018 0.1  0.0 - 0.5 K/uL Final  . Basophils Relative 04/15/2018 1  % Final  . Basophils Absolute 04/15/2018 0.1  0.0 - 0.1 K/uL Final  . WBC Morphology 04/15/2018 MORPHOLOGY UNREMARKABLE   Final  . RBC Morphology 04/15/2018 MORPHOLOGY UNREMARKABLE   Final  . Smear Review 04/15/2018 Normal platelet morphology   Final  . Immature Granulocytes 04/15/2018 0  % Final  . Abs Immature Granulocytes 04/15/2018 0.04  0.00 - 0.07 K/uL Final   Performed at [facility], [address], [place], [place] [address]  . Sodium 04/15/2018 138  135 - 145 mmol/L Final  . Potassium 04/15/2018 4.2  3.5 - 5.1 mmol/L Final  . Chloride 04/15/2018 108  98 - 111 mmol/L  Final  . CO2 04/15/2018 26  22 - 32 mmol/L Final  . Glucose, Bld 04/15/2018 114* 70 - 99 mg/dL Final  . BUN 95/62/1308 22* 6 - 20 mg/dL Final  . Creatinine, Ser 04/15/2018 0.73  0.44 - 1.00 mg/dL Final  . Calcium 65/78/4696 9.0  8.9 - 10.3 mg/dL Final  . Total Protein 04/15/2018 7.4  6.5 - 8.1 g/dL Final  . Albumin 29/52/8413 4.0  3.5 - 5.0 g/dL Final  . AST 24/40/1027 16  15 - 41 U/L Final  . ALT 04/15/2018 16  0 - 44 U/L Final  . Alkaline Phosphatase 04/15/2018 122  38 - 126 U/L Final  . Total Bilirubin 04/15/2018 0.5  0.3 - 1.2 mg/dL Final  . GFR calc non Af Amer 04/15/2018 >60  >60 mL/min Final  . GFR calc Af Amer 04/15/2018 >60  >60 mL/min Final  . Anion gap 04/15/2018 4* 5 - 15 Final   Performed at [facility], [address] [place], [place] [address]  . Color, Urine 04/15/2018 YELLOW* YELLOW Final  . APPearance 04/15/2018 CLEAR* CLEAR Final  . Specific Gravity, Urine 04/15/2018 1.026  1.005 - 1.030 Final  . pH 04/15/2018 5.0  5.0 - 8.0 Final  . Glucose, UA 04/15/2018 NEGATIVE  NEGATIVE mg/dL Final  . Hgb urine dipstick 04/15/2018 NEGATIVE  NEGATIVE Final  . Bilirubin Urine 04/15/2018 NEGATIVE  NEGATIVE Final  . Ketones, ur 04/15/2018 NEGATIVE  NEGATIVE mg/dL Final  . Protein, ur 60/45/409812/01/2018 NEGATIVE  NEGATIVE mg/dL Final  . Nitrite 11/91/478212/01/2018 NEGATIVE  NEGATIVE Final  . Leukocytes, UA 04/15/2018 TRACE* NEGATIVE Final  . RBC / HPF 04/15/2018 0-5  0 - 5 RBC/hpf Final  . WBC, UA 04/15/2018 0-5  0 - 5 WBC/hpf Final  . Bacteria, UA 04/15/2018 NONE SEEN  NONE SEEN Final  . Squamous Epithelial / LPF 04/15/2018 0-5  0 - 5 Final   Performed at [facility], [address] [place], [place] [address]  . HIV Screen 4th Generation wRfx 04/17/2018 Non Reactive  Non Reactive Final   Comment: (NOTE) Performed At: [facility] [address] [place], [place] [id number] Jolene SchimkeNagendra Sanjai MD NG:[id number]:[phone]   . Weight 04/17/2018 3,920  oz Final  . Height  04/17/2018 71  in Final  . BP 04/17/2018 110/60  mmHg Final  . TSH 04/17/2018 0.442  0.350 - 4.500 uIU/mL Final   Comment: Performed by a 3rd Generation assay with a functional sensitivity of <=0.01 uIU/mL. Performed at [facility], [address] [place], [place] [address]   . Magnesium 04/17/2018 2.2  1.7 - 2.4 mg/dL Final   Performed at [facility], [address] [place], [place] [address]  . WBC 04/19/2018 8.0  4.0 - 10.5 K/uL Final  . RBC 04/19/2018 4.81  3.87 - 5.11 MIL/uL Final  . Hemoglobin 04/19/2018 12.7  12.0 - 15.0 g/dL Final  . HCT 64/40/347412/13/2019 36.9  36.0 - 46.0 % Final  . MCV 04/19/2018 76.7* 80.0 - 100.0 fL Final  . MCH 04/19/2018 26.4  26.0 - 34.0 pg Final  . MCHC 04/19/2018 34.4  30.0 - 36.0 g/dL Final  . RDW 25/95/638712/13/2019 13.9  11.5 - 15.5 % Final  . Platelets 04/19/2018 259  150 - 400 K/uL Final  . nRBC 04/19/2018 0.0  0.0 - 0.2 % Final   Performed at [facility], [address] [place], [place] [address]  . Sodium 04/19/2018 140  135 - 145 mmol/L Final  . Potassium 04/19/2018 3.8  3.5 - 5.1 mmol/L Final  . Chloride 04/19/2018 109  98 - 111 mmol/L Final  . CO2 04/19/2018 25  22 - 32 mmol/L Final  . Glucose, Bld 04/19/2018 97  70 - 99 mg/dL Final  . BUN 29/51/884112/13/2019 20  6 - 20 mg/dL Final  . Creatinine, Ser 04/19/2018 0.64  0.44 - 1.00 mg/dL Final  . Calcium 66/06/301612/13/2019 8.3* 8.9 - 10.3 mg/dL Final  . GFR calc non Af Amer 04/19/2018 >60  >  60 mL/min Final  . GFR calc Af Amer 04/19/2018 >60  >60 mL/min Final  . Anion gap 04/19/2018 6  5 - 15 Final   Performed at [facility], [address] [place], [place] [address]    Assessment:  Delsa SaleJacqueline Renee Simmers is a 58 y.o. female with multiple sclerosis diagnosed in 1995.  She was previously treated with Copaxone. . Head MRI on 04/17/2018 revealed multiple white matter hyperintensities including radial periventricular and juxta cortical lesions compatible with history of multiple  sclerosis. There was no enhancement identified to suggest an active process.  Lumbar spine MRI on 04/17/2018 revealed no acute osseous abnormality or malalignment.  There was mild-to-moderate lumbar spondylosis predominantly at L3-S1.  There was mild L3-4, moderate L4-5, and mild L5-S1 foraminal and spinal canal stenosis.  Hepatitis B core antibody total and hepatitis B surface antibody quantitative was negative on 07/14/2016.  Hepatitis testing on 12/16/2015 was negative for the following: Hepatitis B surface antigen, hepatitis Be antigen, hepatitis B core IgM antibody, hepatitis B core total antibody, hepatitis B E antibody, hepatitis B surface antibody, and hepatitis C antibody.  She has received Rituxan (09/06/2016 - 11/07/2017).  She had an MS flare and received Solumedrol 1000 mg daily x 5 (03/05/2017 - 03/09/2017).  She is [facility] resides [facility].  [facility] from 04/15/2018 - 04/19/2018 with generalized weakness.  Weakness felt due to infection.  Symptomatically, she has lower extremity weakness.   Plan: 1.   Review labs from 04/19/2018. 2.   Multiple sclerosis  Cycle #5 Rituxan today.  Review plan for Rituxan every 8 months per Dr Malvin JohnsPotter. 3.   RTC in 8 months for MD assessment, labs (CBC with diff, CMP), and cycle #6 Rituxan.   Quentin MullingBryan Gray, NP  06/12/2018, 10:21 AM   I saw and evaluated the patient, participating in the key portions of the service and reviewing pertinent diagnostic studies and records.  I reviewed the nurse practitioner's note and agree with the findings and the plan.  The assessment and plan were discussed with the patient.  A few questions were asked by the patient and answered.   Nelva NayMelissa Corcoran, MD 06/12/2018, 10:21 AM

## 2018-06-12 NOTE — Progress Notes (Signed)
No new changes noted today 

## 2018-06-13 ENCOUNTER — Ambulatory Visit: Payer: Medicare HMO | Attending: Neurology

## 2018-06-13 DIAGNOSIS — R2689 Other abnormalities of gait and mobility: Secondary | ICD-10-CM | POA: Diagnosis present

## 2018-06-13 DIAGNOSIS — R2681 Unsteadiness on feet: Secondary | ICD-10-CM | POA: Insufficient documentation

## 2018-06-13 DIAGNOSIS — M6281 Muscle weakness (generalized): Secondary | ICD-10-CM | POA: Insufficient documentation

## 2018-06-13 NOTE — Therapy (Signed)
[place] MAIN [facility] SERVICES [address] [place], [place], [address] Phone: [phone]   Fax:  [phone]  Physical Therapy Treatment Physical Therapy Progress Note   Dates of reporting period  05/09/18   to   06/13/18   Patient Details  Name: Lisa Williams MRN: [id number] Date of Birth: [birth year] Referring Provider (PT): Gurney Maxin, MD   Encounter Date: 06/13/2018  PT End of Session - 06/13/18 1223    Visit Number  55    Number of Visits  33    Date for PT Re-Evaluation  07/04/18    Authorization Type   10/10 starting 05/09/2018 (next 1/10 PN start 06/13/18)     PT Start Time  1114    PT Stop Time  1201    PT Time Calculation (min)  47 min    Equipment Utilized During Treatment  Gait belt    Activity Tolerance  Patient limited by fatigue;Patient tolerated treatment well    Behavior During Therapy  [facility] for tasks assessed/performed       Past Medical History:  Diagnosis Date  . Abdominal pain, right upper quadrant   . Back pain   . Calculus of kidney 12/09/2013  . Chronic back pain    unspecified  . Chronic left shoulder pain 07/19/2015  . Functional disorder of bladder    other  . Galactorrhea 11/26/2014   Chronic   . Hereditary and idiopathic neuropathy 08/19/2013  . HPV test positive   . Hypercholesteremia 08/19/2013  . Incomplete bladder emptying   . Microscopic hematuria   . MS (multiple sclerosis) ([place])   . Muscle spasticity 05/21/2014  . Nonspecific findings on examination of urine    other  . Osteopenia   . Status post laparoscopic supracervical hysterectomy 11/26/2014  . Tobacco user 11/26/2014  . Wrist fracture     Past Surgical History:  Procedure Laterality Date  . bilateral tubal ligation  1996  . BREAST CYST EXCISION Left 2002  . KNEE SURGERY     right  . LAPAROSCOPIC SUPRACERVICAL HYSTERECTOMY  08/05/2013  . ORIF WRIST FRACTURE Left 01/17/2017   Procedure: OPEN REDUCTION INTERNAL FIXATION (ORIF)  WRIST FRACTURE;  Surgeon: Lovell Sheehan, MD;  Location: [facility] ORS;  Service: Orthopedics;  Laterality: Left;  . TUBAL LIGATION Bilateral   . VAGINAL HYSTERECTOMY  03/2006    There were no vitals filed for this visit.  Subjective Assessment - 06/13/18 1221    Subjective  Patient has her new AFOs and had her MS infusion yesterday. Didn't get out till after 4 yesterday so was tired. Is raining today so is a little wet.     Patient is accompained by:  Family member    Pertinent History  Pt is a 59 y/o F who presents with BLE weakness and difficulty ambulating due to h/o MS that was diagnosed in 1995. Pt denies any pain associated. Pt was admitted to the hospital in November 2018 due to MS exacerbation. Nov-Dec to HHPT-->[facility] until end of Jan. After rehab the pt was able to ambulate up to 100 ft with the RW. Pt has Bil AFO, her therapist at [facility] thinks she needs a new AFO as her feet still drag when she ambulates, pt reports HHPT mentioned a spring AFO. Pt has custom AFOs that were originally made 4 years ago. Goals: improve balance, to be able to get into the shower with her tub bench without assist, to be able to get up 2  steps at sister's home with railings on both sides but too far apart to reach both sides, to be able to drive adapted car. Pt is staying at other sister's house with one step to get inside and currently picking up L leg with UEs and then stepping in. Pt steps down leading with LLE. Pt wants to work on walking side to side as this is challenging and she had not yet progressed to this at rehab. Pt able to get in/out the car with assist only to bring BLEs into the car. Hoping to drive by June or July with adapted car (already has one). Pt needs assist bringing LEs into and out of hospital bed. Pt able to perform sit>stand without assist from [facility] with a pillow on the seat for a boost. Pt denies any falls in the past 6 months. Pt has been increasing her time alone at home up to a  few hours at a time to become more independent. Pt has both manual and power WC. Uses power WC at home, uses manual WC out in community. Pt able to self propel manual WC. Pt needs assist putting pants around ankles but she can pull them up. Pt can put on her shoes but needs help getting her leg up to do so. Pt able to tie her shoelaces on her own. Pt has a tub/shower unit at home with a tub bench. No grab bars in the shower. Has hand held shower head. Pt currently taking a sponge bath. Pt has a regular height toilet with a BSC over top. Pt with h/o L wrist fx with no precautions, pt wore her soft brace to evaluation for comfort but reports her wrist has healed and is doing well following OT.     Limitations  Lifting;Standing;Walking;House hold activities    How long can you sit comfortably?  n/a    How long can you stand comfortably?  3 minutes without UE assist, limited by fatigue    How long can you walk comfortably?  100 ft, limited by fatigue    Patient Stated Goals  see above    Currently in Pain?  No/denies          Goals re-assessed:   10 MWT: 44 seconds=.239ms with new AFOs and slippery feet Hip strength: improving with muscle contraction force and duration of hold however has difficulty with hip flexion due to weight of leg. Knee extension: 3/5 Knee flexion 3/5.   ABC=23.75%    STS from standard height: 17 inch height CGA  Stand 5 minutes: painful to R hip/glute so terminated   Treatment:    3000Gr weighted ball:  Flexion seated for core activation 10x Twists seated 10x  Ball toss 10x with perturbations for core stability.   Seated balloon taps reaching inside and outsideBOS to promote postural strengthening x3 minutes   Seated adductor squeezes 10x 3-5 second holds with PT cueing for duration of muscle contraction and hold      Pt educated throughout session about proper posture and technique with exercises. Improved exercise technique, movement  at target joints, use of target muscles after min to mod verbal, visual, tactile cues  Use of ice to bring core temperature down to functional range.   Patient's condition has the potential to improve in response to therapy. Maximum improvement is yet to be obtained. The anticipated improvement is attainable and reasonable in a generally predictable time.  Patient reports she feels like she is moving better and is able to  use her legs more.                PT Education - 06/13/18 1222    Education provided  Yes    Education Details  exercise technique, goals, POC    Person(s) Educated  Patient    Methods  Explanation;Demonstration;Tactile cues;Verbal cues    Comprehension  Verbalized understanding;Returned demonstration;Tactile cues required;Need further instruction;Verbal cues required       PT Short Term Goals - 06/13/18 1138      PT SHORT TERM GOAL #1   Title  Pt will be completed HEP with min assist from caregiver at least 4 days/wk for improved carryover between sessions    Baseline  HEP compliant     Time  2    Period  Weeks    Status  Achieved      PT SHORT TERM GOAL #2   Title  Patient will report a worst pain of 6/10 on VAS in cervical spine to improve tolerance with ADLs and reduced symptoms with activities.     Baseline  4/30: 9/10 5/21: 0/10 pain    Time  2    Period  Weeks    Status  Achieved      PT SHORT TERM GOAL #3   Title  Patient will perform cervical rotation at least 50 degrees, flexion and extension to 70 degrees, and sidebending to 40 degrees without pain    Baseline  4/30: R 50 L 34 Rotation, R 26 L 35 sb, Extension 35 flex 35 ; 5/21: extension 46, SB R 41 L 40, rotation R 62 L 61,     Time  2    Period  Weeks    Status  Achieved        PT Long Term Goals - 06/13/18 0001      PT LONG TERM GOAL #2   Title  Pt's 24mT will improve to at least 0.5 m/s for improved safety ambulating in home    Baseline  0.14 m/s; 5/15: .232m  7/10: .36  m/s  8/20: .42 m/s  9/26: 0.2625m10/14: .26 m/s improved hip and knee clearance 11/15: 0.75m76mmproved hip and knee clearance; 12/5: .29 m/s improved step length and forward moment: decreased foot drag. 05/09/2018: 0.21 m/s 2/6: .237 m/s    Time  8    Period  Weeks    Status  Partially Met    Target Date  07/04/18      PT LONG TERM GOAL #3   Title  Pt's Bil hip F strength will improve to at least 2+/5 BLEs for improved gait mechanics and safety    Baseline  L: 2/5, R: 1/5 7/10: 2/5 bilaterally  8/20: 2/5 bilat 9/26: 2/5 bilat 10/14: 2/5 bilaterally ,improved hip flexor muscle activation 2/5: improved hip flexor and quad activation: 12/5: 2/5 ; 05/09/2018: 2/5 2/6: 3/5 knee, 2 to 2+/5 hip     Time  8    Period  Weeks    Status  Partially Met    Target Date  07/04/18      PT LONG TERM GOAL #4   Title  Pt's ABC Scale will improve to at least 40% to demonstrate improved balance    Baseline  28.13% 5/15; 24.4% 7/10: 23.4% 8/20: 22. 2% 9/26: 25.3% 10/14: 26.5% 11/15: 24.38% 12/5: 26%; 05/09/2018: 29% 2/6: 23.75%    Time  8    Period  Weeks    Status  Partially Met    Target  Date  07/04/18      PT LONG TERM GOAL  #12   TITLE  Patient will negotiate one step ~4 inches to enter house independently to increase functional independence in natural environment.     Baseline  8/20: unable to do 9/26: patient too fatigued/not feeling well to perform 10/14: unable to perform at this time 11/15: unable to perform at this time; 12/5: able to lift 1 inch; 05/09/2018: unable to clear 4" step, able to lift foot ~ 1in    Time  8    Period  Weeks    Status  On-going    Target Date  07/04/18      PT LONG TERM GOAL  #13   TITLE  Patient will perform STS from standard height surface to walker to allow patient to participate and transition in public areas when not in wheelchair.     Baseline  10/14: able to perform in W/C which is raised slightly 11/15: 20inch height 12/5: 18 inches from plinth table; 05/09/2018: 18  inches from plinth table2/6: 17 inch plinth table    Time  8    Period  Weeks    Status  Achieved    Target Date  07/04/18      PT LONG TERM GOAL  #14   TITLE  Patient will stand 5 minutes to increase standing duration to allow patient to return to washing dishes and clothes.     Baseline  12/5: 56 seconds; 05/09/2018: 1 min 18 sec 2/6: 2 minutes last session    Time  8    Period  Weeks    Status  On-going    Target Date  07/04/18      PT LONG TERM GOAL  #15   TITLE  Patient will negotiate 30 ft of changing surfaces outside such as pavement and parking lot to allow patient to walk into church.     Baseline  12/5: does not walk outside    Time  8    Period  Weeks    Status  On-going    Target Date  07/04/18            Plan - 06/13/18 1258    Clinical Impression Statement   Patient continues to progress with ability to perform foot clearance bilaterally, however continues to be challenged with LLE clearance more than right. Patient demonstrates improved activation of LE's with decreasing reliance upon UE's. Improved mobility speed despite having wet shoes. Patient's condition has the potential to improve in response to therapy. Maximum improvement is yet to be obtained. The anticipated improvement is attainable and reasonable in a generally predictable time.  Patient will continue to benefit from skilled therapeutic intervention to address deficits in strength, monbility, balance, and function for improved overall QOL and safety    Rehab Potential  Good    PT Frequency  2x / week    PT Duration  8 weeks    PT Treatment/Interventions  ADLs/Self Care Home Management;Aquatic Therapy;Biofeedback;Cryotherapy;Electrical Stimulation;Iontophoresis 63m/ml Dexamethasone;Traction;Moist Heat;Ultrasound;DME Instruction;Gait training;Stair training;Functional mobility training;Therapeutic activities;Therapeutic exercise;Balance training;Neuromuscular re-education;Patient/family education;Orthotic  Fit/Training;Wheelchair mobility training;Manual techniques;Compression bandaging;Passive range of motion;Dry needling;Energy conservation;Taping;Splinting    PT Next Visit Plan  continue strengthening/balance    PT Home Exercise Plan  to initiate next session (please incorporate hip flexor and adductor stretches in HEP if possible    Consulted and Agree with Plan of Care  Patient       Patient will benefit from skilled therapeutic intervention in order  to improve the following deficits and impairments:  Abnormal gait, Decreased activity tolerance, Decreased balance, Decreased coordination, Decreased endurance, Decreased knowledge of use of DME, Decreased mobility, Decreased range of motion, Decreased safety awareness, Decreased strength, Difficulty walking, Impaired perceived functional ability, Impaired sensation, Impaired tone, Impaired UE functional use, Improper body mechanics, Postural dysfunction  Visit Diagnosis: Muscle weakness (generalized)  Other abnormalities of gait and mobility  Unsteadiness on feet     Problem List Patient Active Problem List   Diagnosis Date Noted  . Abscess of female pelvis   . SVT (supraventricular tachycardia) ([place])   . Radial styloid tenosynovitis 03/12/2018  . Wheelchair confinement 02/27/2018  . Localized osteoporosis with current pathological fracture with routine healing 01/19/2017  . Wrist fracture 01/16/2017  . Sprain of ankle 03/23/2016  . Closed fracture of lateral malleolus 03/16/2016  . Health care maintenance 01/24/2016  . Blood pressure elevated without history of HTN 10/25/2015  . Essential hypertension 10/25/2015  . Multiple sclerosis ([place]) 10/02/2015  . Chronic left shoulder pain 07/19/2015  . Multiple sclerosis exacerbation ([place]) 07/14/2015  . MS (multiple sclerosis) ([place]) 11/26/2014  . Increased body mass index 11/26/2014  . HPV test positive 11/26/2014  . Status post laparoscopic supracervical hysterectomy 11/26/2014  .  Galactorrhea 11/26/2014  . Back ache 05/21/2014  . Adiposity 05/21/2014  . Disordered sleep 05/21/2014  . Muscle spasticity 05/21/2014  . Spasticity 05/21/2014  . Calculus of kidney 12/09/2013  . Renal colic 32/35/5732  . Hypercholesteremia 08/19/2013  . Hereditary and idiopathic neuropathy 08/19/2013  . Hypercholesterolemia without hypertriglyceridemia 08/19/2013  . Bladder infection, chronic 07/25/2012  . Disorder of bladder function 07/25/2012  . Incomplete bladder emptying 07/25/2012  . Microscopic hematuria 07/25/2012  . Right upper quadrant pain 07/25/2012   Janna Arch, PT, DPT   06/13/2018, 2:56 PM  [place] MAIN [facility] SERVICES [address] [place], [place], [address] Phone: [phone]   Fax:  [phone]  Name: Lisa Williams MRN: [id number] Date of Birth: 08-Aug-1960

## 2018-06-17 ENCOUNTER — Ambulatory Visit: Payer: Medicare HMO

## 2018-06-17 DIAGNOSIS — M6281 Muscle weakness (generalized): Secondary | ICD-10-CM | POA: Diagnosis not present

## 2018-06-17 DIAGNOSIS — R2689 Other abnormalities of gait and mobility: Secondary | ICD-10-CM

## 2018-06-17 DIAGNOSIS — R2681 Unsteadiness on feet: Secondary | ICD-10-CM

## 2018-06-17 NOTE — Therapy (Signed)
[place] MAIN [facility] SERVICES [place], [place], [address] Phone: [phone]   Fax:  [phone]  Physical Therapy Treatment  Patient Details  Name: Lisa Williams MRN: [id number] Date of Birth: 09/04/1960 Referring Provider (PT): Gurney Maxin, MD   Encounter Date: 06/17/2018  PT End of Session - 06/17/18 1422    Visit Number  60    Number of Visits  64    Date for PT Re-Evaluation  07/04/18    Authorization Type    1/10 PN start 06/13/18    PT Start Time  1301    PT Stop Time  1346    PT Time Calculation (min)  45 min    Equipment Utilized During Treatment  Gait belt    Activity Tolerance  Patient limited by fatigue;Patient tolerated treatment well    Behavior During Therapy  [facility] for tasks assessed/performed       Past Medical History:  Diagnosis Date  . Abdominal pain, right upper quadrant   . Back pain   . Calculus of kidney 12/09/2013  . Chronic back pain    unspecified  . Chronic left shoulder pain 07/19/2015  . Functional disorder of bladder    other  . Galactorrhea 11/26/2014   Chronic   . Hereditary and idiopathic neuropathy 08/19/2013  . HPV test positive   . Hypercholesteremia 08/19/2013  . Incomplete bladder emptying   . Microscopic hematuria   . MS (multiple sclerosis) ([place])   . Muscle spasticity 05/21/2014  . Nonspecific findings on examination of urine    other  . Osteopenia   . Status post laparoscopic supracervical hysterectomy 11/26/2014  . Tobacco user 11/26/2014  . Wrist fracture     Past Surgical History:  Procedure Laterality Date  . bilateral tubal ligation  1996  . BREAST CYST EXCISION Left 2002  . KNEE SURGERY     right  . LAPAROSCOPIC SUPRACERVICAL HYSTERECTOMY  08/05/2013  . ORIF WRIST FRACTURE Left 01/17/2017   Procedure: OPEN REDUCTION INTERNAL FIXATION (ORIF) WRIST FRACTURE;  Surgeon: Lovell Sheehan, MD;  Location: [facility] ORS;  Service: Orthopedics;  Laterality: Left;  .  TUBAL LIGATION Bilateral   . VAGINAL HYSTERECTOMY  03/2006    There were no vitals filed for this visit.  Subjective Assessment - 06/17/18 1421    Subjective  Patient's father went to the hospital this weekend for a hip fx and then coded yesterday, is in CCU at this time. Patient has been at hospital all weekend for her father and is fatigued.     Patient is accompained by:  Family member    Pertinent History  Pt is a 58 y/o F who presents with BLE weakness and difficulty ambulating due to h/o MS that was diagnosed in 1995. Pt denies any pain associated. Pt was admitted to the hospital in November 2018 due to MS exacerbation. Nov-Dec to HHPT-->[facility] until end of Jan. After rehab the pt was able to ambulate up to 100 ft with the RW. Pt has Bil AFO, her therapist at [facility] thinks she needs a new AFO as her feet still drag when she ambulates, pt reports HHPT mentioned a spring AFO. Pt has custom AFOs that were originally made 4 years ago. Goals: improve balance, to be able to get into the shower with her tub bench without assist, to be able to get up 2 steps at sister's home with railings on both sides but too far apart to reach both sides,  to be able to drive adapted car. Pt is staying at other sister's house with one step to get inside and currently picking up L leg with UEs and then stepping in. Pt steps down leading with LLE. Pt wants to work on walking side to side as this is challenging and she had not yet progressed to this at rehab. Pt able to get in/out the car with assist only to bring BLEs into the car. Hoping to drive by June or July with adapted car (already has one). Pt needs assist bringing LEs into and out of hospital bed. Pt able to perform sit>stand without assist from [facility] with a pillow on the seat for a boost. Pt denies any falls in the past 6 months. Pt has been increasing her time alone at home up to a few hours at a time to become more independent. Pt has both manual and power  WC. Uses power WC at home, uses manual WC out in community. Pt able to self propel manual WC. Pt needs assist putting pants around ankles but she can pull them up. Pt can put on her shoes but needs help getting her leg up to do so. Pt able to tie her shoelaces on her own. Pt has a tub/shower unit at home with a tub bench. No grab bars in the shower. Has hand held shower head. Pt currently taking a sponge bath. Pt has a regular height toilet with a BSC over top. Pt with h/o L wrist fx with no precautions, pt wore her soft brace to evaluation for comfort but reports her wrist has healed and is doing well following OT.     Limitations  Lifting;Standing;Walking;House hold activities    How long can you sit comfortably?  n/a    How long can you stand comfortably?  3 minutes without UE assist, limited by fatigue    How long can you walk comfortably?  100 ft, limited by fatigue    Patient Stated Goals  see above    Currently in Pain?  Yes    Pain Score  2     Pain Location  Back    Pain Orientation  Lower;Mid    Pain Descriptors / Indicators  Aching    Pain Type  Acute pain    Pain Onset  In the past 7 days    Pain Frequency  Intermittent      forward trunk stretch 2x     Standing in // bars: Weight shift laterally to promote unweighting of limb for pregait technique. Patient requires cueing to decrease pull with UE's and have more shifting with hips.Initially requires Mod A at pelvis which decreased to CGA  Step over green band in // bars 5x each LE, clearing full width of band for five consecutive times for R,3consecutive times LLE.; CGA with verbal cueing for weight shift   Seated: LAQ 10x each LE, LE slightly extended to reduce friction of shoe. Requires Min A to RLE due to fatigue  isometric adduction against PT hand 12x 3 second holds             Isometric abduction against PT hand 12x 3 second holds                          Isometric hip flexion against PT hand 12x 3 second  holds  Seated balloon taps reaching inside and outsideBOS to promote postural strengthening.  Swiss ball forward stretch for back  pain relief 10x 10 second holds  PVC pipe forward and lateral stretch with PT guiding for optimal muscle tissue tension 4x20 seconds.   Pt educated throughout session about proper posture and technique with exercises. Improved exercise technique, movement at target joints, use of target muscles after min to mod verbal, visual, tactile cues  Use of ice to bring core temperature down to functional range.                 PT Education - 06/17/18 1422    Education provided  Yes    Education Details  exercise technique, stretching back, body mechanics    Person(s) Educated  Patient    Methods  Explanation;Demonstration;Tactile cues;Verbal cues    Comprehension  Verbalized understanding;Returned demonstration;Verbal cues required;Tactile cues required       PT Short Term Goals - 06/13/18 1138      PT SHORT TERM GOAL #1   Title  Pt will be completed HEP with min assist from caregiver at least 4 days/wk for improved carryover between sessions    Baseline  HEP compliant     Time  2    Period  Weeks    Status  Achieved      PT SHORT TERM GOAL #2   Title  Patient will report a worst pain of 6/10 on VAS in cervical spine to improve tolerance with ADLs and reduced symptoms with activities.     Baseline  4/30: 9/10 5/21: 0/10 pain    Time  2    Period  Weeks    Status  Achieved      PT SHORT TERM GOAL #3   Title  Patient will perform cervical rotation at least 50 degrees, flexion and extension to 70 degrees, and sidebending to 40 degrees without pain    Baseline  4/30: R 50 L 34 Rotation, R 26 L 35 sb, Extension 35 flex 35 ; 5/21: extension 46, SB R 41 L 40, rotation R 62 L 61,     Time  2    Period  Weeks    Status  Achieved        PT Long Term Goals - 06/13/18 0001      PT LONG TERM GOAL #2   Title  Pt's 36mT will improve to at  least 0.5 m/s for improved safety ambulating in home    Baseline  0.14 m/s; 5/15: .262m  7/10: .36 m/s  8/20: .42 m/s  9/26: 0.2636m10/14: .26 m/s improved hip and knee clearance 11/15: 0.50m34mmproved hip and knee clearance; 12/5: .29 m/s improved step length and forward moment: decreased foot drag. 05/09/2018: 0.21 m/s 2/6: .237 m/s    Time  8    Period  Weeks    Status  Partially Met    Target Date  07/04/18      PT LONG TERM GOAL #3   Title  Pt's Bil hip F strength will improve to at least 2+/5 BLEs for improved gait mechanics and safety    Baseline  L: 2/5, R: 1/5 7/10: 2/5 bilaterally  8/20: 2/5 bilat 9/26: 2/5 bilat 10/14: 2/5 bilaterally ,improved hip flexor muscle activation 2/5: improved hip flexor and quad activation: 12/5: 2/5 ; 05/09/2018: 2/5 2/6: 3/5 knee, 2 to 2+/5 hip     Time  8    Period  Weeks    Status  Partially Met    Target Date  07/04/18      PT LONG TERM GOAL #4  Title  Pt's ABC Scale will improve to at least 40% to demonstrate improved balance    Baseline  28.13% 5/15; 24.4% 7/10: 23.4% 8/20: 22. 2% 9/26: 25.3% 10/14: 26.5% 11/15: 24.38% 12/5: 26%; 05/09/2018: 29% 2/6: 23.75%    Time  8    Period  Weeks    Status  Partially Met    Target Date  07/04/18      PT LONG TERM GOAL  #12   TITLE  Patient will negotiate one step ~4 inches to enter house independently to increase functional independence in natural environment.     Baseline  8/20: unable to do 9/26: patient too fatigued/not feeling well to perform 10/14: unable to perform at this time 11/15: unable to perform at this time; 12/5: able to lift 1 inch; 05/09/2018: unable to clear 4" step, able to lift foot ~ 1in    Time  8    Period  Weeks    Status  On-going    Target Date  07/04/18      PT LONG TERM GOAL  #13   TITLE  Patient will perform STS from standard height surface to walker to allow patient to participate and transition in public areas when not in wheelchair.     Baseline  10/14: able to perform in  W/C which is raised slightly 11/15: 20inch height 12/5: 18 inches from plinth table; 05/09/2018: 18 inches from plinth table2/6: 17 inch plinth table    Time  8    Period  Weeks    Status  Achieved    Target Date  07/04/18      PT LONG TERM GOAL  #14   TITLE  Patient will stand 5 minutes to increase standing duration to allow patient to return to washing dishes and clothes.     Baseline  12/5: 56 seconds; 05/09/2018: 1 min 18 sec 2/6: 2 minutes last session    Time  8    Period  Weeks    Status  On-going    Target Date  07/04/18      PT LONG TERM GOAL  #15   TITLE  Patient will negotiate 30 ft of changing surfaces outside such as pavement and parking lot to allow patient to walk into church.     Baseline  12/5: does not walk outside    Time  8    Period  Weeks    Status  On-going    Target Date  07/04/18            Plan - 06/17/18 1430    Clinical Impression Statement   Patient required more frequent seated rest breaks due to fatigue from this weekend. Low back pain relieved with interventions however core interventions were minimized to reduce re-irritation. Patient has noted improved quadriceps recruitment with isometric hip flexion as felt through PT's hands and resistance.  Patient will continue to benefit from skilled therapeutic intervention to address deficits in strength, monbility, balance, and function for improved overall QOL and safety    Rehab Potential  Good    PT Frequency  2x / week    PT Duration  8 weeks    PT Treatment/Interventions  ADLs/Self Care Home Management;Aquatic Therapy;Biofeedback;Cryotherapy;Electrical Stimulation;Iontophoresis 28m/ml Dexamethasone;Traction;Moist Heat;Ultrasound;DME Instruction;Gait training;Stair training;Functional mobility training;Therapeutic activities;Therapeutic exercise;Balance training;Neuromuscular re-education;Patient/family education;Orthotic Fit/Training;Wheelchair mobility training;Manual techniques;Compression  bandaging;Passive range of motion;Dry needling;Energy conservation;Taping;Splinting    PT Next Visit Plan  continue strengthening/balance    PT Home Exercise Plan  to initiate next session (please  incorporate hip flexor and adductor stretches in HEP if possible    Consulted and Agree with Plan of Care  Patient       Patient will benefit from skilled therapeutic intervention in order to improve the following deficits and impairments:  Abnormal gait, Decreased activity tolerance, Decreased balance, Decreased coordination, Decreased endurance, Decreased knowledge of use of DME, Decreased mobility, Decreased range of motion, Decreased safety awareness, Decreased strength, Difficulty walking, Impaired perceived functional ability, Impaired sensation, Impaired tone, Impaired UE functional use, Improper body mechanics, Postural dysfunction  Visit Diagnosis: Muscle weakness (generalized)  Other abnormalities of gait and mobility  Unsteadiness on feet     Problem List Patient Active Problem List   Diagnosis Date Noted  . Abscess of female pelvis   . SVT (supraventricular tachycardia) ([place])   . Radial styloid tenosynovitis 03/12/2018  . Wheelchair confinement 02/27/2018  . Localized osteoporosis with current pathological fracture with routine healing 01/19/2017  . Wrist fracture 01/16/2017  . Sprain of ankle 03/23/2016  . Closed fracture of lateral malleolus 03/16/2016  . Health care maintenance 01/24/2016  . Blood pressure elevated without history of HTN 10/25/2015  . Essential hypertension 10/25/2015  . Multiple sclerosis ([place]) 10/02/2015  . Chronic left shoulder pain 07/19/2015  . Multiple sclerosis exacerbation ([place]) 07/14/2015  . MS (multiple sclerosis) ([place]) 11/26/2014  . Increased body mass index 11/26/2014  . HPV test positive 11/26/2014  . Status post laparoscopic supracervical hysterectomy 11/26/2014  . Galactorrhea 11/26/2014  . Back ache 05/21/2014  . Adiposity 05/21/2014   . Disordered sleep 05/21/2014  . Muscle spasticity 05/21/2014  . Spasticity 05/21/2014  . Calculus of kidney 12/09/2013  . Renal colic 97/06/6376  . Hypercholesteremia 08/19/2013  . Hereditary and idiopathic neuropathy 08/19/2013  . Hypercholesterolemia without hypertriglyceridemia 08/19/2013  . Bladder infection, chronic 07/25/2012  . Disorder of bladder function 07/25/2012  . Incomplete bladder emptying 07/25/2012  . Microscopic hematuria 07/25/2012  . Right upper quadrant pain 07/25/2012   Janna Arch, PT, DPT   06/17/2018, 2:32 PM  [place] MAIN [facility] SERVICES [address] [place], [place], [address] Phone: [phone]   Fax:  [phone]  Name: Korena Nass Valone MRN: [id number] Date of Birth: [birth year]

## 2018-06-18 ENCOUNTER — Ambulatory Visit: Payer: Medicare HMO

## 2018-06-20 ENCOUNTER — Ambulatory Visit: Payer: Medicare HMO

## 2018-06-24 ENCOUNTER — Ambulatory Visit: Payer: Medicare HMO

## 2018-06-24 DIAGNOSIS — M6281 Muscle weakness (generalized): Secondary | ICD-10-CM

## 2018-06-24 DIAGNOSIS — R2689 Other abnormalities of gait and mobility: Secondary | ICD-10-CM

## 2018-06-24 DIAGNOSIS — R2681 Unsteadiness on feet: Secondary | ICD-10-CM

## 2018-06-24 NOTE — Therapy (Signed)
[place] MAIN [facility] SERVICES [address] [place], [place], [address] Phone: [phone]   Fax:  [phone]  Physical Therapy Treatment  Patient Details  Name: Lisa Williams MRN: [id number] Date of Birth: 1960-06-10 Referring Provider (PT): Gurney Maxin, MD   Encounter Date: 06/24/2018  PT End of Session - 06/24/18 1404    Visit Number  52    Number of Visits  82    Date for PT Re-Evaluation  07/04/18    Authorization Type    2/10 PN start 06/13/18    PT Start Time  1302    PT Stop Time  1340    PT Time Calculation (min)  38 min    Equipment Utilized During Treatment  Gait belt    Activity Tolerance  Patient limited by fatigue    Behavior During Therapy  [facility] for tasks assessed/performed       Past Medical History:  Diagnosis Date  . Abdominal pain, right upper quadrant   . Back pain   . Calculus of kidney 12/09/2013  . Chronic back pain    unspecified  . Chronic left shoulder pain 07/19/2015  . Functional disorder of bladder    other  . Galactorrhea 11/26/2014   Chronic   . Hereditary and idiopathic neuropathy 08/19/2013  . HPV test positive   . Hypercholesteremia 08/19/2013  . Incomplete bladder emptying   . Microscopic hematuria   . MS (multiple sclerosis) ([place])   . Muscle spasticity 05/21/2014  . Nonspecific findings on examination of urine    other  . Osteopenia   . Status post laparoscopic supracervical hysterectomy 11/26/2014  . Tobacco user 11/26/2014  . Wrist fracture     Past Surgical History:  Procedure Laterality Date  . bilateral tubal ligation  1996  . BREAST CYST EXCISION Left 2002  . KNEE SURGERY     right  . LAPAROSCOPIC SUPRACERVICAL HYSTERECTOMY  08/05/2013  . ORIF WRIST FRACTURE Left 01/17/2017   Procedure: OPEN REDUCTION INTERNAL FIXATION (ORIF) WRIST FRACTURE;  Surgeon: Lovell Sheehan, MD;  Location: [facility] ORS;  Service: Orthopedics;  Laterality: Left;  . TUBAL LIGATION Bilateral   . VAGINAL  HYSTERECTOMY  03/2006    There were no vitals filed for this visit.  Subjective Assessment - 06/24/18 1401    Subjective  Patient reported that she has been very busy all week dealing with family plans and events, as well as planning her father's funeral. Pt is in good spirits under the circumtances, but is tired.    Pertinent History  Pt is a 58 y/o F who presents with BLE weakness and difficulty ambulating due to h/o MS that was diagnosed in 1995. Pt denies any pain associated. Pt was admitted to the hospital in November 2018 due to MS exacerbation. Nov-Dec to HHPT-->[facility] until end of Jan. After rehab the pt was able to ambulate up to 100 ft with the RW. Pt has Bil AFO, her therapist at [place] thinks she needs a new AFO as her feet still drag when she ambulates, pt reports HHPT mentioned a spring AFO. Pt has custom AFOs that were originally made 4 years ago. Goals: improve balance, to be able to get into the shower with her tub bench without assist, to be able to get up 2 steps at sister's home with railings on both sides but too far apart to reach both sides, to be able to drive adapted car. Pt is staying at other sister's house with  one step to get inside and currently picking up L leg with UEs and then stepping in. Pt steps down leading with LLE. Pt wants to work on walking side to side as this is challenging and she had not yet progressed to this at rehab. Pt able to get in/out the car with assist only to bring BLEs into the car. Hoping to drive by June or July with adapted car (already has one). Pt needs assist bringing LEs into and out of hospital bed. Pt able to perform sit>stand without assist from [facility] with a pillow on the seat for a boost. Pt denies any falls in the past 6 months. Pt has been increasing her time alone at home up to a few hours at a time to become more independent. Pt has both manual and power WC. Uses power WC at home, uses manual WC out in community. Pt able to self  propel manual WC. Pt needs assist putting pants around ankles but she can pull them up. Pt can put on her shoes but needs help getting her leg up to do so. Pt able to tie her shoelaces on her own. Pt has a tub/shower unit at home with a tub bench. No grab bars in the shower. Has hand held shower head. Pt currently taking a sponge bath. Pt has a regular height toilet with a BSC over top. Pt with h/o L wrist fx with no precautions, pt wore her soft brace to evaluation for comfort but reports her wrist has healed and is doing well following OT.     Limitations  Lifting;Standing;Walking;House hold activities    How long can you sit comfortably?  n/a    How long can you stand comfortably?  3 minutes without UE assist, limited by fatigue    How long can you walk comfortably?  100 ft, limited by fatigue    Patient Stated Goals  see above    Currently in Pain?  No/denies       TREATMENT:  Therapeutic exercise:  Standing in // bars: Weight shift laterally to promote unweighting of limb for pregait technique until fatigue Patient requires cueing to decrease pull with UE's and have more shifting with hips. Initially requires tactile and verbal cues pelvis involvement.  Standing weight shifts with one foot forward bilaterally x10 in prep for gait, as well as improved activity tolerance/balance/decrease UE support  Standing weight shifts with knee bends to promote weight shift and muscular control until fatigue  Seated: LAQ 10x each LE, LE slightly extended to reduce friction of shoe. Requires Min A to RLE due to fatigue   Seated balloon taps reaching inside and outside BOS to promote postural strengthening.    Patient required rest breaks after each activity today due to fatigue. Normally patient is CGA for sit to stand transfer with use of UE support with parallel bars. 1 Instance patient was modAx1 to complete transfer after 2 attempts of being unable to rise without physical assist. Exercises  modified accordingly to adjust to patient's current physical fatigue status. At end of session complained of and exhibited fatigue. The patient would benefit from further skilled PT to progress program as tolerated to maximize mobility, independence and ability to perform functional activities.    PT Education - 06/24/18 1402    Education provided  Yes    Education Details  gait mechanics, weight shifting, exercise technique    Person(s) Educated  Patient    Methods  Explanation;Demonstration;Tactile cues  PT Short Term Goals - 06/13/18 1138      PT SHORT TERM GOAL #1   Title  Pt will be completed HEP with min assist from caregiver at least 4 days/wk for improved carryover between sessions    Baseline  HEP compliant     Time  2    Period  Weeks    Status  Achieved      PT SHORT TERM GOAL #2   Title  Patient will report a worst pain of 6/10 on VAS in cervical spine to improve tolerance with ADLs and reduced symptoms with activities.     Baseline  4/30: 9/10 5/21: 0/10 pain    Time  2    Period  Weeks    Status  Achieved      PT SHORT TERM GOAL #3   Title  Patient will perform cervical rotation at least 50 degrees, flexion and extension to 70 degrees, and sidebending to 40 degrees without pain    Baseline  4/30: R 50 L 34 Rotation, R 26 L 35 sb, Extension 35 flex 35 ; 5/21: extension 46, SB R 41 L 40, rotation R 62 L 61,     Time  2    Period  Weeks    Status  Achieved        PT Long Term Goals - 06/13/18 0001      PT LONG TERM GOAL #2   Title  Pt's 31mT will improve to at least 0.5 m/s for improved safety ambulating in home    Baseline  0.14 m/s; 5/15: .266m  7/10: .36 m/s  8/20: .42 m/s  9/26: 0.2638m10/14: .26 m/s improved hip and knee clearance 11/15: 0.63m14mmproved hip and knee clearance; 12/5: .29 m/s improved step length and forward moment: decreased foot drag. 05/09/2018: 0.21 m/s 2/6: .237 m/s    Time  8    Period  Weeks    Status  Partially Met    Target  Date  07/04/18      PT LONG TERM GOAL #3   Title  Pt's Bil hip F strength will improve to at least 2+/5 BLEs for improved gait mechanics and safety    Baseline  L: 2/5, R: 1/5 7/10: 2/5 bilaterally  8/20: 2/5 bilat 9/26: 2/5 bilat 10/14: 2/5 bilaterally ,improved hip flexor muscle activation 2/5: improved hip flexor and quad activation: 12/5: 2/5 ; 05/09/2018: 2/5 2/6: 3/5 knee, 2 to 2+/5 hip     Time  8    Period  Weeks    Status  Partially Met    Target Date  07/04/18      PT LONG TERM GOAL #4   Title  Pt's ABC Scale will improve to at least 40% to demonstrate improved balance    Baseline  28.13% 5/15; 24.4% 7/10: 23.4% 8/20: 22. 2% 9/26: 25.3% 10/14: 26.5% 11/15: 24.38% 12/5: 26%; 05/09/2018: 29% 2/6: 23.75%    Time  8    Period  Weeks    Status  Partially Met    Target Date  07/04/18      PT LONG TERM GOAL  #12   TITLE  Patient will negotiate one step ~4 inches to enter house independently to increase functional independence in natural environment.     Baseline  8/20: unable to do 9/26: patient too fatigued/not feeling well to perform 10/14: unable to perform at this time 11/15: unable to perform at this time; 12/5: able to lift 1 inch; 05/09/2018: unable  to clear 4" step, able to lift foot ~ 1in    Time  8    Period  Weeks    Status  On-going    Target Date  07/04/18      PT LONG TERM GOAL  #13   TITLE  Patient will perform STS from standard height surface to walker to allow patient to participate and transition in public areas when not in wheelchair.     Baseline  10/14: able to perform in W/C which is raised slightly 11/15: 20inch height 12/5: 18 inches from plinth table; 05/09/2018: 18 inches from plinth table2/6: 17 inch plinth table    Time  8    Period  Weeks    Status  Achieved    Target Date  07/04/18      PT LONG TERM GOAL  #14   TITLE  Patient will stand 5 minutes to increase standing duration to allow patient to return to washing dishes and clothes.     Baseline  12/5: 56  seconds; 05/09/2018: 1 min 18 sec 2/6: 2 minutes last session    Time  8    Period  Weeks    Status  On-going    Target Date  07/04/18      PT LONG TERM GOAL  #15   TITLE  Patient will negotiate 30 ft of changing surfaces outside such as pavement and parking lot to allow patient to walk into church.     Baseline  12/5: does not walk outside    Time  8    Period  Weeks    Status  On-going    Target Date  07/04/18            Plan - 06/24/18 1403    Clinical Impression Statement  Patient required rest breaks after each activty today due to fatigue. Normally patient is CGA for sit to stand transfer with use of UE support with parallel bars. 1 Instance patient was modAx1 to complete transfer after 2 attempts of being unable to rise without physical assist. Exercises modified accordingly to adjust to patient's current physical fatigue status. At end of session complained of and exhibited fatigue.    Rehab Potential  Good    PT Frequency  2x / week    PT Duration  8 weeks    PT Treatment/Interventions  ADLs/Self Care Home Management;Aquatic Therapy;Biofeedback;Cryotherapy;Electrical Stimulation;Iontophoresis 56m/ml Dexamethasone;Traction;Moist Heat;Ultrasound;DME Instruction;Gait training;Stair training;Functional mobility training;Therapeutic activities;Therapeutic exercise;Balance training;Neuromuscular re-education;Patient/family education;Orthotic Fit/Training;Wheelchair mobility training;Manual techniques;Compression bandaging;Passive range of motion;Dry needling;Energy conservation;Taping;Splinting    PT Next Visit Plan  continue strengthening/balance    PT Home Exercise Plan  to initiate next session (please incorporate hip flexor and adductor stretches in HEP if possible    Consulted and Agree with Plan of Care  Patient       Patient will benefit from skilled therapeutic intervention in order to improve the following deficits and impairments:  Abnormal gait, Decreased activity  tolerance, Decreased balance, Decreased coordination, Decreased endurance, Decreased knowledge of use of DME, Decreased mobility, Decreased range of motion, Decreased safety awareness, Decreased strength, Difficulty walking, Impaired perceived functional ability, Impaired sensation, Impaired tone, Impaired UE functional use, Improper body mechanics, Postural dysfunction  Visit Diagnosis: Muscle weakness (generalized)  Other abnormalities of gait and mobility  Unsteadiness on feet     Problem List Patient Active Problem List   Diagnosis Date Noted  . Abscess of female pelvis   . [place]   . Radial styloid  tenosynovitis 03/12/2018  . Wheelchair confinement 02/27/2018  . Localized osteoporosis with current pathological fracture with routine healing 01/19/2017  . Wrist fracture 01/16/2017  . Sprain of ankle 03/23/2016  . Closed fracture of lateral malleolus 03/16/2016  . Health care maintenance 01/24/2016  . Blood pressure elevated without history of HTN 10/25/2015  . Essential hypertension 10/25/2015  . Multiple sclerosis ([place]) 10/02/2015  . Chronic left shoulder pain 07/19/2015  . Multiple sclerosis exacerbation ([place]) 07/14/2015  . MS (multiple sclerosis) ([place]) 11/26/2014  . Increased body mass index 11/26/2014  . HPV test positive 11/26/2014  . Status post laparoscopic supracervical hysterectomy 11/26/2014  . Galactorrhea 11/26/2014  . Back ache 05/21/2014  . Adiposity 05/21/2014  . Disordered sleep 05/21/2014  . Muscle spasticity 05/21/2014  . Spasticity 05/21/2014  . Calculus of kidney 12/09/2013  . Renal colic 04/30/8249  . Hypercholesteremia 08/19/2013  . Hereditary and idiopathic neuropathy 08/19/2013  . Hypercholesterolemia without hypertriglyceridemia 08/19/2013  . Bladder infection, chronic 07/25/2012  . Disorder of bladder function 07/25/2012  . Incomplete bladder emptying 07/25/2012  . Microscopic hematuria 07/25/2012  . Right  upper quadrant pain 07/25/2012    Lieutenant Diego PT, DPT 2:10 PM,06/24/18 [place] MAIN [facility] SERVICES [address] [place], [place], [address] Phone: [phone]   Fax:  [phone]  Name: Lisa Williams MRN: [id number] Date of Birth: August 02, 1960

## 2018-06-25 ENCOUNTER — Ambulatory Visit: Payer: Medicare HMO

## 2018-06-25 ENCOUNTER — Telehealth: Payer: Self-pay | Admitting: Urology

## 2018-06-25 MED ORDER — OXYBUTYNIN CHLORIDE ER 10 MG PO TB24: 10.0000 mg | ORAL_TABLET | Freq: Every day | ORAL | 3 refills | Status: DC

## 2018-06-25 NOTE — Telephone Encounter (Signed)
Rx sent to pharmacy   

## 2018-06-25 NOTE — Telephone Encounter (Signed)
Pt needs a new RX for Oxybutnin 5 mg tablets sent to [facility] on [address]  Dr Apolinar Junes told pt when she finished her current RX to call office to have new RX.  She would like a 90 day supply.

## 2018-06-26 ENCOUNTER — Ambulatory Visit: Payer: Medicare HMO

## 2018-06-27 ENCOUNTER — Ambulatory Visit: Payer: Medicare HMO

## 2018-06-28 ENCOUNTER — Other Ambulatory Visit: Payer: Self-pay | Admitting: Urology

## 2018-06-28 MED ORDER — OXYBUTYNIN CHLORIDE 5 MG PO TABS: 5.0000 mg | ORAL_TABLET | Freq: Two times a day (BID) | ORAL | 3 refills | Status: DC

## 2018-06-28 NOTE — Telephone Encounter (Signed)
Pt cannot afford the 10 mg tabs, she called and requested regular 5 mg tablets, NOT extended release of Oxybutnin to be sent for generic, so she can get a 3 month supply.  She has 7 days left of RX.  She said to call her if you don't understand what she's trying to explain.

## 2018-07-01 ENCOUNTER — Ambulatory Visit: Payer: Medicare HMO

## 2018-07-01 DIAGNOSIS — M6281 Muscle weakness (generalized): Secondary | ICD-10-CM | POA: Diagnosis not present

## 2018-07-01 DIAGNOSIS — R2681 Unsteadiness on feet: Secondary | ICD-10-CM

## 2018-07-01 DIAGNOSIS — R2689 Other abnormalities of gait and mobility: Secondary | ICD-10-CM

## 2018-07-01 NOTE — Therapy (Signed)
[place] MAIN [facility] SERVICES [place], [place], [address] Phone: [phone]   Fax:  [phone]  Physical Therapy Treatment/ RECERT  Patient Details  Name: Lisa Williams MRN: [id number] Date of Birth: 09/21/1960 Referring Provider (PT): Gurney Maxin, MD   Encounter Date: 07/01/2018  PT End of Session - 07/01/18 1426    Visit Number  51    Number of Visits  31    Date for PT Re-Evaluation  08/26/18    Authorization Type    3/10 PN start 06/13/18    PT Start Time  1300    PT Stop Time  1344    PT Time Calculation (min)  44 min    Equipment Utilized During Treatment  Gait belt    Activity Tolerance  Patient limited by fatigue    Behavior During Therapy  [facility] for tasks assessed/performed       Past Medical History:  Diagnosis Date  . Abdominal pain, right upper quadrant   . Back pain   . Calculus of kidney 12/09/2013  . Chronic back pain    unspecified  . Chronic left shoulder pain 07/19/2015  . Functional disorder of bladder    other  . Galactorrhea 11/26/2014   Chronic   . Hereditary and idiopathic neuropathy 08/19/2013  . HPV test positive   . Hypercholesteremia 08/19/2013  . Incomplete bladder emptying   . Microscopic hematuria   . MS (multiple sclerosis) ([place])   . Muscle spasticity 05/21/2014  . Nonspecific findings on examination of urine    other  . Osteopenia   . Status post laparoscopic supracervical hysterectomy 11/26/2014  . Tobacco user 11/26/2014  . Wrist fracture     Past Surgical History:  Procedure Laterality Date  . bilateral tubal ligation  1996  . BREAST CYST EXCISION Left 2002  . KNEE SURGERY     right  . LAPAROSCOPIC SUPRACERVICAL HYSTERECTOMY  08/05/2013  . ORIF WRIST FRACTURE Left 01/17/2017   Procedure: OPEN REDUCTION INTERNAL FIXATION (ORIF) WRIST FRACTURE;  Surgeon: Lovell Sheehan, MD;  Location: [facility] ORS;  Service: Orthopedics;  Laterality: Left;  . TUBAL LIGATION Bilateral   .  VAGINAL HYSTERECTOMY  03/2006    There were no vitals filed for this visit.  Subjective Assessment - 07/01/18 1417    Subjective  Patient reports she is starting to feel a little better. Had a very busy week due to her fathers death the week prior and having the wake and funeral last week.     Pertinent History  Pt is a 58 y/o F who presents with BLE weakness and difficulty ambulating due to h/o MS that was diagnosed in 1995. Pt denies any pain associated. Pt was admitted to the hospital in November 2018 due to MS exacerbation. Nov-Dec to HHPT-->[facility] until end of Jan. After rehab the pt was able to ambulate up to 100 ft with the RW. Pt has Bil AFO, her therapist at [facility] thinks she needs a new AFO as her feet still drag when she ambulates, pt reports HHPT mentioned a spring AFO. Pt has custom AFOs that were originally made 4 years ago. Goals: improve balance, to be able to get into the shower with her tub bench without assist, to be able to get up 2 steps at sister's home with railings on both sides but too far apart to reach both sides, to be able to drive adapted car. Pt is staying at other sister's house with one  step to get inside and currently picking up L leg with UEs and then stepping in. Pt steps down leading with LLE. Pt wants to work on walking side to side as this is challenging and she had not yet progressed to this at rehab. Pt able to get in/out the car with assist only to bring BLEs into the car. Hoping to drive by June or July with adapted car (already has one). Pt needs assist bringing LEs into and out of hospital bed. Pt able to perform sit>stand without assist from [facility] with a pillow on the seat for a boost. Pt denies any falls in the past 6 months. Pt has been increasing her time alone at home up to a few hours at a time to become more independent. Pt has both manual and power WC. Uses power WC at home, uses manual WC out in community. Pt able to self propel manual WC. Pt needs  assist putting pants around ankles but she can pull them up. Pt can put on her shoes but needs help getting her leg up to do so. Pt able to tie her shoelaces on her own. Pt has a tub/shower unit at home with a tub bench. No grab bars in the shower. Has hand held shower head. Pt currently taking a sponge bath. Pt has a regular height toilet with a BSC over top. Pt with h/o L wrist fx with no precautions, pt wore her soft brace to evaluation for comfort but reports her wrist has healed and is doing well following OT.     Limitations  Lifting;Standing;Walking;House hold activities    How long can you sit comfortably?  n/a    How long can you stand comfortably?  3 minutes without UE assist, limited by fatigue    How long can you walk comfortably?  100 ft, limited by fatigue    Patient Stated Goals  see above    Currently in Pain?  No/denies       RECERT -take goals from 2/6 due to recent death in family resulting in fatigue.    TREATMENT:   // bars: weight shift L and R with focus on hip momentum rather than usage of UE for pre-gait interventions.   Step over green band in // bars 5x each LE, clearing full width of band for five consecutive times for R, 5 consecutive times LLE.; CGA with verbal cueing for weight shift. First time patient has been able to [facility]  Standing tolerance; 1 minute 41 seconds.    Seated:   Balloon taps without back support: reaching inside and outside BOS to challenge trunk and core stability in a functional range for pre-gait and transfers x 3 minutes, x2 trials               isometric adduction against PT hand 12x 3 second holds             Isometric abduction against PT hand 12x 3 second holds                          Isometric hip flexion against PT hand 12x 3 second holds    Pt educated throughout session about proper posture and technique with exercises. Improved exercise technique, movement at target joints, use of target muscles after min to mod  verbal, visual, tactile cues              Seated rest breaks and use of ice pack  utilized to keep body temperature at functional range to reduce MS exacerbation.           Patient's condition has the potential to improve in response to therapy. Maximum improvement is yet to be obtained. The anticipated improvement is attainable and reasonable in a generally predictable time.  Patient reports she is improving and able to be more independent with her ADLs and iADLs. She is beginning to get back to tasks she hasn't been able to do since her hospitalization. She would like to start progressing towards walking outside and on unstable surfaces to become more independent with iADLs and be able to be mobile in the community.         PT Education - 07/01/18 1418    Education provided  Yes    Education Details  gait mechanics, POC, weight shifting    Person(s) Educated  Patient    Methods  Explanation;Demonstration;Tactile cues    Comprehension  Verbalized understanding;Returned demonstration;Verbal cues required;Tactile cues required;Need further instruction       PT Short Term Goals - 07/01/18 1626      PT SHORT TERM GOAL #1   Title  Pt will be completed HEP with min assist from caregiver at least 4 days/wk for improved carryover between sessions    Baseline  HEP compliant     Time  2    Period  Weeks    Status  Achieved      PT SHORT TERM GOAL #2   Title  Patient will report a worst pain of 6/10 on VAS in cervical spine to improve tolerance with ADLs and reduced symptoms with activities.     Baseline  4/30: 9/10 5/21: 0/10 pain    Time  2    Period  Weeks    Status  Achieved      PT SHORT TERM GOAL #3   Title  Patient will perform cervical rotation at least 50 degrees, flexion and extension to 70 degrees, and sidebending to 40 degrees without pain    Baseline  4/30: R 50 L 34 Rotation, R 26 L 35 sb, Extension 35 flex 35 ; 5/21: extension 46, SB R 41 L 40, rotation R 62 L 61,      Time  2    Period  Weeks    Status  Achieved        PT Long Term Goals - 07/01/18 0001      PT LONG TERM GOAL #2   Title  Pt's 45mT will improve to at least 0.5 m/s for improved safety ambulating in home    Baseline  0.14 m/s; 5/15: .269m  7/10: .36 m/s  8/20: .42 m/s  9/26: 0.262m10/14: .26 m/s improved hip and knee clearance 11/15: 0.35m44mmproved hip and knee clearance; 12/5: .29 m/s improved step length and forward moment: decreased foot drag. 05/09/2018: 0.21 m/s 2/6: .237 m/s    Time  8    Period  Weeks    Status  Partially Met    Target Date  08/26/18      PT LONG TERM GOAL #3   Title  Pt's Bil hip F strength will improve to at least 2+/5 BLEs for improved gait mechanics and safety    Baseline  L: 2/5, R: 1/5 7/10: 2/5 bilaterally  8/20: 2/5 bilat 9/26: 2/5 bilat 10/14: 2/5 bilaterally ,improved hip flexor muscle activation 2/5: improved hip flexor and quad activation: 12/5: 2/5 ; 05/09/2018: 2/5 2/6: 3/5 knee, 2 to 2+/5  hip     Time  8    Period  Weeks    Status  Partially Met    Target Date  08/26/18      PT LONG TERM GOAL #4   Title  Pt's ABC Scale will improve to at least 40% to demonstrate improved balance    Baseline  28.13% 5/15; 24.4% 7/10: 23.4% 8/20: 22. 2% 9/26: 25.3% 10/14: 26.5% 11/15: 24.38% 12/5: 26%; 05/09/2018: 29% 2/6: 23.75%    Time  8    Period  Weeks    Status  Partially Met    Target Date  08/26/18      PT LONG TERM GOAL #6   Title  Pt will be able to perform tub bench into/out of shower transfer without assist for improved independence with this task    Baseline  challenged with steps at this time, getting better, stands and gives sponge bath independently now  9/26: has not tried transfer yet 11/15: has not attempted yet; 05/09/2018: not attempted    Time  8   08/26/18   Period  Weeks    Status  Partially Met    Target Date  08/26/18      PT LONG TERM GOAL  #12   TITLE  Patient will negotiate one step ~4 inches to enter house independently to  increase functional independence in natural environment.     Baseline  8/20: unable to do 9/26: patient too fatigued/not feeling well to perform 10/14: unable to perform at this time 11/15: unable to perform at this time; 12/5: able to lift 1 inch; 05/09/2018: unable to clear 4" step, able to lift foot ~ 1in    Time  8    Period  Weeks    Status  On-going    Target Date  08/26/18      PT LONG TERM GOAL  #13   TITLE  Patient will perform STS from standard height surface to walker to allow patient to participate and transition in public areas when not in wheelchair.     Baseline  10/14: able to perform in W/C which is raised slightly 11/15: 20inch height 12/5: 18 inches from plinth table; 05/09/2018: 18 inches from plinth table2/6: 17 inch plinth table    Time  8    Period  Weeks    Status  Achieved      PT LONG TERM GOAL  #14   TITLE  Patient will stand 5 minutes to increase standing duration to allow patient to return to washing dishes and clothes.     Baseline  12/5: 56 seconds; 05/09/2018: 1 min 18 sec 2/6: 2 minutes last session    Time  8    Period  Weeks    Status  On-going    Target Date  08/26/18      PT LONG TERM GOAL  #15   TITLE  Patient will negotiate 30 ft of changing surfaces outside such as pavement and parking lot to allow patient to walk into church.     Baseline  12/5: does not walk outside 2/24: unable to test due to rain    Time  8    Period  Weeks    Status  On-going    Target Date  08/26/18            Plan - 07/01/18 1510    Clinical Impression Statement  Patient's goals were re-assessed on 3 sessions prior with patient demonstrating progress towards functional goals. Due  to recent death in her family and absence due to loss of her father goals will be taken from the recent retest. Patient's condition has the potential to improve in response to therapy. Maximum improvement is yet to be obtained. The anticipated improvement is attainable and reasonable in a  generally predictable time.  The patient would benefit from further skilled PT to progress program as tolerated to maximize mobility, independence and ability to perform functional activities.     Rehab Potential  Good    PT Frequency  2x / week    PT Duration  8 weeks    PT Treatment/Interventions  ADLs/Self Care Home Management;Aquatic Therapy;Biofeedback;Cryotherapy;Electrical Stimulation;Iontophoresis 35m/ml Dexamethasone;Traction;Moist Heat;Ultrasound;DME Instruction;Gait training;Stair training;Functional mobility training;Therapeutic activities;Therapeutic exercise;Balance training;Neuromuscular re-education;Patient/family education;Orthotic Fit/Training;Wheelchair mobility training;Manual techniques;Compression bandaging;Passive range of motion;Dry needling;Energy conservation;Taping;Splinting    PT Next Visit Plan  continue strengthening/balance    PT Home Exercise Plan  to initiate next session (please incorporate hip flexor and adductor stretches in HEP if possible    Consulted and Agree with Plan of Care  Patient       Patient will benefit from skilled therapeutic intervention in order to improve the following deficits and impairments:  Abnormal gait, Decreased activity tolerance, Decreased balance, Decreased coordination, Decreased endurance, Decreased knowledge of use of DME, Decreased mobility, Decreased range of motion, Decreased safety awareness, Decreased strength, Difficulty walking, Impaired perceived functional ability, Impaired sensation, Impaired tone, Impaired UE functional use, Improper body mechanics, Postural dysfunction  Visit Diagnosis: Muscle weakness (generalized)  Other abnormalities of gait and mobility  Unsteadiness on feet     Problem List Patient Active Problem List   Diagnosis Date Noted  . Abscess of female pelvis   . SVT (sup[place]ricular tachycardia) ([place]   . Radial styloid tenosynovitis 03/12/2018  . Wheelchair confinement 02/27/2018  .  Localized osteoporosis with current pathological fracture with routine healing 01/19/2017  . Wrist fracture 01/16/2017  . Sprain of ankle 03/23/2016  . Closed fracture of lateral malleolus 03/16/2016  . Health care maintenance 01/24/2016  . Blood pressure elevated without history of HTN 10/25/2015  . Essential hypertension 10/25/2015  . Multiple sclerosis ([place] 10/02/2015  . Chronic left shoulder pain 07/19/2015  . [place] 07/14/2015  . MS (multiple sclerosis) ([place] 11/26/2014  . Increased body mass index 11/26/2014  . HPV test positive 11/26/2014  . Status post laparoscopic supracervical hysterectomy 11/26/2014  . Galactorrhea 11/26/2014  . Back ache 05/21/2014  . Adiposity 05/21/2014  . Disordered sleep 05/21/2014  . Muscle spasticity 05/21/2014  . Spasticity 05/21/2014  . Calculus of kidney 12/09/2013  . Renal colic 070/34/0352 . Hypercholesteremia 08/19/2013  . Hereditary and idiopathic neuropathy 08/19/2013  . Hypercholesterolemia without hypertriglyceridemia 08/19/2013  . Bladder infection, chronic 07/25/2012  . Disorder of bladder function 07/25/2012  . Incomplete bladder emptying 07/25/2012  . Microscopic hematuria 07/25/2012  . Right upper quadrant pain 07/25/2012   MJanna Arch PT, DPT   07/01/2018, 4:27 PM  [place] [facility]ERVICES [address]RGil[address] [place] [address]Phone: [phone]  Fax:  [phone] Name: JSubrina VecchiarelliRamseur MRN: [id number] of Birth: 601-20-1962

## 2018-07-02 ENCOUNTER — Ambulatory Visit: Payer: Medicare HMO

## 2018-07-03 ENCOUNTER — Ambulatory Visit: Payer: Medicare HMO

## 2018-07-03 DIAGNOSIS — R2689 Other abnormalities of gait and mobility: Secondary | ICD-10-CM

## 2018-07-03 DIAGNOSIS — R2681 Unsteadiness on feet: Secondary | ICD-10-CM

## 2018-07-03 DIAGNOSIS — M6281 Muscle weakness (generalized): Secondary | ICD-10-CM

## 2018-07-03 NOTE — Therapy (Signed)
[place] MAIN [facility] SERVICES [address] [place], [place], [address] Phone: [phone]   Fax:  [phone]  Physical Therapy Treatment  Patient Details  Name: Lisa Williams MRN: [id number] Date of Birth: 1961-01-12 Referring Provider (PT): Gurney Maxin, MD   Encounter Date: 07/03/2018  PT End of Session - 07/03/18 1439    Visit Number  60    Number of Visits  44    Date for PT Re-Evaluation  08/26/18    Authorization Type   4/10 PN start 06/13/18    PT Start Time  1344    PT Stop Time  1431    PT Time Calculation (min)  47 min    Equipment Utilized During Treatment  Gait belt    Activity Tolerance  Patient limited by fatigue    Behavior During Therapy  [facility] for tasks assessed/performed       Past Medical History:  Diagnosis Date  . Abdominal pain, right upper quadrant   . Back pain   . Calculus of kidney 12/09/2013  . Chronic back pain    unspecified  . Chronic left shoulder pain 07/19/2015  . Functional disorder of bladder    other  . Galactorrhea 11/26/2014   Chronic   . Hereditary and idiopathic neuropathy 08/19/2013  . HPV test positive   . Hypercholesteremia 08/19/2013  . Incomplete bladder emptying   . Microscopic hematuria   . MS (multiple sclerosis) ([place])   . Muscle spasticity 05/21/2014  . Nonspecific findings on examination of urine    other  . Osteopenia   . Status post laparoscopic supracervical hysterectomy 11/26/2014  . Tobacco user 11/26/2014  . Wrist fracture     Past Surgical History:  Procedure Laterality Date  . bilateral tubal ligation  1996  . BREAST CYST EXCISION Left 2002  . KNEE SURGERY     right  . LAPAROSCOPIC SUPRACERVICAL HYSTERECTOMY  08/05/2013  . ORIF WRIST FRACTURE Left 01/17/2017   Procedure: OPEN REDUCTION INTERNAL FIXATION (ORIF) WRIST FRACTURE;  Surgeon: Lovell Sheehan, MD;  Location: [facility] ORS;  Service: Orthopedics;  Laterality: Left;  . TUBAL LIGATION Bilateral   . VAGINAL  HYSTERECTOMY  03/2006    There were no vitals filed for this visit.  Subjective Assessment - 07/03/18 1408    Subjective  Patient has gotten a new drive cruiser 3 wheelchair since her last one broke. Was able to get down her small step at home by herself today.     Pertinent History  Pt is a 58 y/o F who presents with BLE weakness and difficulty ambulating due to h/o MS that was diagnosed in 1995. Pt denies any pain associated. Pt was admitted to the hospital in November 2018 due to MS exacerbation. Nov-Dec to HHPT-->[facility] until end of Jan. After rehab the pt was able to ambulate up to 100 ft with the RW. Pt has Bil AFO, her therapist at [place] thinks she needs a new AFO as her feet still drag when she ambulates, pt reports HHPT mentioned a spring AFO. Pt has custom AFOs that were originally made 4 years ago. Goals: improve balance, to be able to get into the shower with her tub bench without assist, to be able to get up 2 steps at sister's home with railings on both sides but too far apart to reach both sides, to be able to drive adapted car. Pt is staying at other sister's house with one step to get inside and currently  picking up L leg with UEs and then stepping in. Pt steps down leading with LLE. Pt wants to work on walking side to side as this is challenging and she had not yet progressed to this at rehab. Pt able to get in/out the car with assist only to bring BLEs into the car. Hoping to drive by June or July with adapted car (already has one). Pt needs assist bringing LEs into and out of hospital bed. Pt able to perform sit>stand without assist from [facility] with a pillow on the seat for a boost. Pt denies any falls in the past 6 months. Pt has been increasing her time alone at home up to a few hours at a time to become more independent. Pt has both manual and power WC. Uses power WC at home, uses manual WC out in community. Pt able to self propel manual WC. Pt needs assist putting pants around  ankles but she can pull them up. Pt can put on her shoes but needs help getting her leg up to do so. Pt able to tie her shoelaces on her own. Pt has a tub/shower unit at home with a tub bench. No grab bars in the shower. Has hand held shower head. Pt currently taking a sponge bath. Pt has a regular height toilet with a BSC over top. Pt with h/o L wrist fx with no precautions, pt wore her soft brace to evaluation for comfort but reports her wrist has healed and is doing well following OT.     Limitations  Lifting;Standing;Walking;House hold activities    How long can you sit comfortably?  n/a    How long can you stand comfortably?  3 minutes without UE assist, limited by fatigue    How long can you walk comfortably?  100 ft, limited by fatigue    Patient Stated Goals  see above    Currently in Pain?  No/denies           Ambulate with RW and CGA, verbal cueing for increasing weight shift onto LLE. 2x 45 ft. Verbal cueing for shift and lift for bilateral foot clearance. Increased fatigue results in increased left foot drag.   Standing coordination; tapping different sticky note letters based on PT verbal guidance, CGA 4 minutes.    Standing tolerance; 1 minute 41 seconds.    Seated:   Weighted ball (4000 Gr)   Vertical straight arm raises 15x  Horizontal twists 15x  Toss 15x               isometric adduction against PT hand 12x 3 second holds               Isometric abduction against PT hand 12x 3 second holds               Isometric hip flexion against PT hand 12x 3 second holds       Pt educated throughout session about proper posture and technique with exercises. Improved exercise technique, movement at target joints, use of target muscles after min to mod verbal, visual, tactile cues   Seated rest breaks and use of ice pack utilized to keep body temperature at functional range to reduce MS exacerbation.                       PT Education - 07/03/18 1438     Education provided  Yes    Education Details  gait mechanics, POC, weight shifting, coordination  Person(s) Educated  Patient    Methods  Explanation;Demonstration;Tactile cues;Verbal cues    Comprehension  Verbalized understanding;Returned demonstration;Verbal cues required;Tactile cues required;Need further instruction       PT Short Term Goals - 07/01/18 1626      PT SHORT TERM GOAL #1   Title  Pt will be completed HEP with min assist from caregiver at least 4 days/wk for improved carryover between sessions    Baseline  HEP compliant     Time  2    Period  Weeks    Status  Achieved      PT SHORT TERM GOAL #2   Title  Patient will report a worst pain of 6/10 on VAS in cervical spine to improve tolerance with ADLs and reduced symptoms with activities.     Baseline  4/30: 9/10 5/21: 0/10 pain    Time  2    Period  Weeks    Status  Achieved      PT SHORT TERM GOAL #3   Title  Patient will perform cervical rotation at least 50 degrees, flexion and extension to 70 degrees, and sidebending to 40 degrees without pain    Baseline  4/30: R 50 L 34 Rotation, R 26 L 35 sb, Extension 35 flex 35 ; 5/21: extension 46, SB R 41 L 40, rotation R 62 L 61,     Time  2    Period  Weeks    Status  Achieved        PT Long Term Goals - 07/01/18 0001      PT LONG TERM GOAL #2   Title  Pt's 53mT will improve to at least 0.5 m/s for improved safety ambulating in home    Baseline  0.14 m/s; 5/15: .232m  7/10: .36 m/s  8/20: .42 m/s  9/26: 0.2654m10/14: .26 m/s improved hip and knee clearance 11/15: 0.13m23mmproved hip and knee clearance; 12/5: .29 m/s improved step length and forward moment: decreased foot drag. 05/09/2018: 0.21 m/s 2/6: .237 m/s    Time  8    Period  Weeks    Status  Partially Met    Target Date  08/26/18      PT LONG TERM GOAL #3   Title  Pt's Bil hip F strength will improve to at least 2+/5 BLEs for improved gait mechanics and safety    Baseline  L: 2/5, R: 1/5 7/10: 2/5  bilaterally  8/20: 2/5 bilat 9/26: 2/5 bilat 10/14: 2/5 bilaterally ,improved hip flexor muscle activation 2/5: improved hip flexor and quad activation: 12/5: 2/5 ; 05/09/2018: 2/5 2/6: 3/5 knee, 2 to 2+/5 hip     Time  8    Period  Weeks    Status  Partially Met    Target Date  08/26/18      PT LONG TERM GOAL #4   Title  Pt's ABC Scale will improve to at least 40% to demonstrate improved balance    Baseline  28.13% 5/15; 24.4% 7/10: 23.4% 8/20: 22. 2% 9/26: 25.3% 10/14: 26.5% 11/15: 24.38% 12/5: 26%; 05/09/2018: 29% 2/6: 23.75%    Time  8    Period  Weeks    Status  Partially Met    Target Date  08/26/18      PT LONG TERM GOAL #6   Title  Pt will be able to perform tub bench into/out of shower transfer without assist for improved independence with this task    Baseline  challenged with  steps at this time, getting better, stands and gives sponge bath independently now  9/26: has not tried transfer yet 11/15: has not attempted yet; 05/09/2018: not attempted    Time  8   08/26/18   Period  Weeks    Status  Partially Met    Target Date  08/26/18      PT LONG TERM GOAL  #12   TITLE  Patient will negotiate one step ~4 inches to enter house independently to increase functional independence in natural environment.     Baseline  8/20: unable to do 9/26: patient too fatigued/not feeling well to perform 10/14: unable to perform at this time 11/15: unable to perform at this time; 12/5: able to lift 1 inch; 05/09/2018: unable to clear 4" step, able to lift foot ~ 1in    Time  8    Period  Weeks    Status  On-going    Target Date  08/26/18      PT LONG TERM GOAL  #13   TITLE  Patient will perform STS from standard height surface to walker to allow patient to participate and transition in public areas when not in wheelchair.     Baseline  10/14: able to perform in W/C which is raised slightly 11/15: 20inch height 12/5: 18 inches from plinth table; 05/09/2018: 18 inches from plinth table2/6: 17 inch plinth  table    Time  8    Period  Weeks    Status  Achieved      PT LONG TERM GOAL  #14   TITLE  Patient will stand 5 minutes to increase standing duration to allow patient to return to washing dishes and clothes.     Baseline  12/5: 56 seconds; 05/09/2018: 1 min 18 sec 2/6: 2 minutes last session    Time  8    Period  Weeks    Status  On-going    Target Date  08/26/18      PT LONG TERM GOAL  #15   TITLE  Patient will negotiate 30 ft of changing surfaces outside such as pavement and parking lot to allow patient to walk into church.     Baseline  12/5: does not walk outside 2/24: unable to test due to rain    Time  8    Period  Weeks    Status  On-going    Target Date  08/26/18            Plan - 07/03/18 1446    Clinical Impression Statement  Patient progressing with functional mobility increasing standing tasks to include coordination introduction. Patient is more challenged with LLE coordination as well as support with prolonged stabilization due to quickened fatigue of LLE>RLE. Patient will continue to benefit from skilled therapeutic intervention to address deficits in strength, monbility, balance, and function for improved overall QOL and safety    Rehab Potential  Good    PT Frequency  2x / week    PT Duration  8 weeks    PT Treatment/Interventions  ADLs/Self Care Home Management;Aquatic Therapy;Biofeedback;Cryotherapy;Electrical Stimulation;Iontophoresis 33m/ml Dexamethasone;Traction;Moist Heat;Ultrasound;DME Instruction;Gait training;Stair training;Functional mobility training;Therapeutic activities;Therapeutic exercise;Balance training;Neuromuscular re-education;Patient/family education;Orthotic Fit/Training;Wheelchair mobility training;Manual techniques;Compression bandaging;Passive range of motion;Dry needling;Energy conservation;Taping;Splinting    PT Next Visit Plan  continue strengthening/balance    PT Home Exercise Plan  to initiate next session (please incorporate hip flexor  and adductor stretches in HEP if possible    Consulted and Agree with Plan of Care  Patient  Patient will benefit from skilled therapeutic intervention in order to improve the following deficits and impairments:  Abnormal gait, Decreased activity tolerance, Decreased balance, Decreased coordination, Decreased endurance, Decreased knowledge of use of DME, Decreased mobility, Decreased range of motion, Decreased safety awareness, Decreased strength, Difficulty walking, Impaired perceived functional ability, Impaired sensation, Impaired tone, Impaired UE functional use, Improper body mechanics, Postural dysfunction  Visit Diagnosis: Muscle weakness (generalized)  Other abnormalities of gait and mobility  Unsteadiness on feet     Problem List Patient Active Problem List   Diagnosis Date Noted  . Abscess of female pelvis   . SVT (supraventricular tachycardia) ([place])   . Radial styloid tenosynovitis 03/12/2018  . Wheelchair confinement 02/27/2018  . Localized osteoporosis with current pathological fracture with routine healing 01/19/2017  . Wrist fracture 01/16/2017  . Sprain of ankle 03/23/2016  . Closed fracture of lateral malleolus 03/16/2016  . Health care maintenance 01/24/2016  . Blood pressure elevated without history of HTN 10/25/2015  . Essential hypertension 10/25/2015  . Multiple sclerosis ([place]) 10/02/2015  . Chronic left shoulder pain 07/19/2015  . Multiple sclerosis exacerbation ([place]) 07/14/2015  . MS (multiple sclerosis) ([place]) 11/26/2014  . Increased body mass index 11/26/2014  . HPV test positive 11/26/2014  . Status post laparoscopic supracervical hysterectomy 11/26/2014  . Galactorrhea 11/26/2014  . Back ache 05/21/2014  . Adiposity 05/21/2014  . Disordered sleep 05/21/2014  . Muscle spasticity 05/21/2014  . Spasticity 05/21/2014  . Calculus of kidney 12/09/2013  . Renal colic 12/82/0813  . Hypercholesteremia 08/19/2013  . Hereditary and idiopathic  neuropathy 08/19/2013  . Hypercholesterolemia without hypertriglyceridemia 08/19/2013  . Bladder infection, chronic 07/25/2012  . Disorder of bladder function 07/25/2012  . Incomplete bladder emptying 07/25/2012  . Microscopic hematuria 07/25/2012  . Right upper quadrant pain 07/25/2012   Janna Arch, PT, DPT   07/03/2018, 2:47 PM  [place] MAIN [facility] SERVICES [address] [place], [place], [address] Phone: [phone]   Fax:  [phone]  Name: Lisa Williams MRN: [id number] Date of Birth: [birth year]

## 2018-07-04 ENCOUNTER — Ambulatory Visit: Payer: Medicare HMO

## 2018-07-08 ENCOUNTER — Ambulatory Visit: Payer: Medicare HMO

## 2018-07-09 ENCOUNTER — Ambulatory Visit: Payer: Medicare HMO

## 2018-07-10 ENCOUNTER — Ambulatory Visit: Payer: Medicare HMO | Attending: Neurology

## 2018-07-10 ENCOUNTER — Ambulatory Visit: Payer: Medicare HMO

## 2018-07-10 DIAGNOSIS — R2681 Unsteadiness on feet: Secondary | ICD-10-CM | POA: Diagnosis present

## 2018-07-10 DIAGNOSIS — M6281 Muscle weakness (generalized): Secondary | ICD-10-CM

## 2018-07-10 DIAGNOSIS — R2689 Other abnormalities of gait and mobility: Secondary | ICD-10-CM

## 2018-07-10 NOTE — Therapy (Addendum)
[place] MAIN [facility] SERVICES [address] [place], [place], [address] Phone: [phone]   Fax:  [phone]  Physical Therapy Treatment  Patient Details  Name: Lisa Williams MRN: [id number] Date of Birth: January 06, 1961 Referring Provider (PT): Gurney Maxin, MD   Encounter Date: 07/10/2018  PT End of Session - 07/10/18 1103    Visit Number  24    Number of Visits  109    Date for PT Re-Evaluation  08/26/18    Authorization Type   5/10 PN start 06/13/18    PT Start Time  0938    PT Stop Time  1016    PT Time Calculation (min)  38 min    Equipment Utilized During Treatment  Gait belt    Activity Tolerance  Patient limited by fatigue    Behavior During Therapy  [facility] for tasks assessed/performed       Past Medical History:  Diagnosis Date  . Abdominal pain, right upper quadrant   . Back pain   . Calculus of kidney 12/09/2013  . Chronic back pain    unspecified  . Chronic left shoulder pain 07/19/2015  . Functional disorder of bladder    other  . Galactorrhea 11/26/2014   Chronic   . Hereditary and idiopathic neuropathy 08/19/2013  . HPV test positive   . Hypercholesteremia 08/19/2013  . Incomplete bladder emptying   . Microscopic hematuria   . MS (multiple sclerosis) ([place])   . Muscle spasticity 05/21/2014  . Nonspecific findings on examination of urine    other  . Osteopenia   . Status post laparoscopic supracervical hysterectomy 11/26/2014  . Tobacco user 11/26/2014  . Wrist fracture     Past Surgical History:  Procedure Laterality Date  . bilateral tubal ligation  1996  . BREAST CYST EXCISION Left 2002  . KNEE SURGERY     right  . LAPAROSCOPIC SUPRACERVICAL HYSTERECTOMY  08/05/2013  . ORIF WRIST FRACTURE Left 01/17/2017   Procedure: OPEN REDUCTION INTERNAL FIXATION (ORIF) WRIST FRACTURE;  Surgeon: Lovell Sheehan, MD;  Location: [facility] ORS;  Service: Orthopedics;  Laterality: Left;  . TUBAL LIGATION Bilateral   . VAGINAL  HYSTERECTOMY  03/2006    There were no vitals filed for this visit.  Subjective Assessment - 07/10/18 1102    Subjective  Patient reports that she is doing well today. Denies pain. Denies falls/stumbles since last visit. She states that she feels a little more tired today.    Pertinent History  Pt is a 58 y/o F who presents with BLE weakness and difficulty ambulating due to h/o MS that was diagnosed in 1995. Pt denies any pain associated. Pt was admitted to the hospital in November 2018 due to MS exacerbation. Nov-Dec to HHPT-->[facility] until end of Jan. After rehab the pt was able to ambulate up to 100 ft with the RW. Pt has Bil AFO, her therapist at [facility] thinks she needs a new AFO as her feet still drag when she ambulates, pt reports HHPT mentioned a spring AFO. Pt has custom AFOs that were originally made 4 years ago. Goals: improve balance, to be able to get into the shower with her tub bench without assist, to be able to get up 2 steps at sister's home with railings on both sides but too far apart to reach both sides, to be able to drive adapted car. Pt is staying at other sister's house with one step to get inside and currently picking up L  leg with UEs and then stepping in. Pt steps down leading with LLE. Pt wants to work on walking side to side as this is challenging and she had not yet progressed to this at rehab. Pt able to get in/out the car with assist only to bring BLEs into the car. Hoping to drive by June or July with adapted car (already has one). Pt needs assist bringing LEs into and out of hospital bed. Pt able to perform sit>stand without assist from [facility] with a pillow on the seat for a boost. Pt denies any falls in the past 6 months. Pt has been increasing her time alone at home up to a few hours at a time to become more independent. Pt has both manual and power WC. Uses power WC at home, uses manual WC out in community. Pt able to self propel manual WC. Pt needs assist putting  pants around ankles but she can pull them up. Pt can put on her shoes but needs help getting her leg up to do so. Pt able to tie her shoelaces on her own. Pt has a tub/shower unit at home with a tub bench. No grab bars in the shower. Has hand held shower head. Pt currently taking a sponge bath. Pt has a regular height toilet with a BSC over top. Pt with h/o L wrist fx with no precautions, pt wore her soft brace to evaluation for comfort but reports her wrist has healed and is doing well following OT.     Limitations  Lifting;Standing;Walking;House hold activities    How long can you sit comfortably?  n/a    How long can you stand comfortably?  3 minutes without UE assist, limited by fatigue    How long can you walk comfortably?  100 ft, limited by fatigue    Patient Stated Goals  see above    Currently in Pain?  No/denies       Standing:  -Lateral weight shift x45 seconds --> lateral weight shift with knee bends: x45 seconds CGA, // bars, improved stability with decreased reliance upon UE's for weight shift; requires BUE support for balance; fatigue towards end of set.   -Short step with AP weight shift x45 seconds each LE behind; demonstrates good weight shifting onto forward foot bilaterally; fatigue towards end of set  -Step over yard stick in // bars 5x each LE, clearing full width of yard stick on first repetition LLE (1/5), clearing full width on first 3 repetitions RLE (3/5); CGA with verbal cueing for weight shift. First time patient has attempted   Seated:   Seated upper case alphabet with red weighted ball/rainbow ball for core; patient indicates it is more difficult than she originally thought, but that it is a good challenge for her; patient benefits from intermittent verbal cueing for breathing              isometric adduction against PT hand 10x 3 second holds               Isometric abduction against PT hand 10x 3 second holds     Pt educated throughout session about proper  posture and technique with exercises. Improved exercise technique, movement at target joints, use of target muscles after min verbal, visual, tactile cues   Seated rest breaks and use of ice pack utilized to keep body temperature at functional range to reduce MS exacerbation.  PT Education - 07/10/18 1103    Education provided  Yes    Education Details  gait mechanics, weight shifting, coordination, strengthening    Person(s) Educated  Patient    Methods  Explanation;Demonstration;Tactile cues;Verbal cues    Comprehension  Verbalized understanding;Returned demonstration;Verbal cues required;Tactile cues required;Need further instruction       PT Short Term Goals - 07/01/18 1626      PT SHORT TERM GOAL #1   Title  Pt will be completed HEP with min assist from caregiver at least 4 days/wk for improved carryover between sessions    Baseline  HEP compliant     Time  2    Period  Weeks    Status  Achieved      PT SHORT TERM GOAL #2   Title  Patient will report a worst pain of 6/10 on VAS in cervical spine to improve tolerance with ADLs and reduced symptoms with activities.     Baseline  4/30: 9/10 5/21: 0/10 pain    Time  2    Period  Weeks    Status  Achieved      PT SHORT TERM GOAL #3   Title  Patient will perform cervical rotation at least 50 degrees, flexion and extension to 70 degrees, and sidebending to 40 degrees without pain    Baseline  4/30: R 50 L 34 Rotation, R 26 L 35 sb, Extension 35 flex 35 ; 5/21: extension 46, SB R 41 L 40, rotation R 62 L 61,     Time  2    Period  Weeks    Status  Achieved        PT Long Term Goals - 07/01/18 0001      PT LONG TERM GOAL #2   Title  Pt's 22mT will improve to at least 0.5 m/s for improved safety ambulating in home    Baseline  0.14 m/s; 5/15: .227m  7/10: .36 m/s  8/20: .42 m/s  9/26: 0.2659m10/14: .26 m/s improved hip and knee clearance 11/15: 0.42m22mmproved hip and knee clearance;  12/5: .29 m/s improved step length and forward moment: decreased foot drag. 05/09/2018: 0.21 m/s 2/6: .237 m/s    Time  8    Period  Weeks    Status  Partially Met    Target Date  08/26/18      PT LONG TERM GOAL #3   Title  Pt's Bil hip F strength will improve to at least 2+/5 BLEs for improved gait mechanics and safety    Baseline  L: 2/5, R: 1/5 7/10: 2/5 bilaterally  8/20: 2/5 bilat 9/26: 2/5 bilat 10/14: 2/5 bilaterally ,improved hip flexor muscle activation 2/5: improved hip flexor and quad activation: 12/5: 2/5 ; 05/09/2018: 2/5 2/6: 3/5 knee, 2 to 2+/5 hip     Time  8    Period  Weeks    Status  Partially Met    Target Date  08/26/18      PT LONG TERM GOAL #4   Title  Pt's ABC Scale will improve to at least 40% to demonstrate improved balance    Baseline  28.13% 5/15; 24.4% 7/10: 23.4% 8/20: 22. 2% 9/26: 25.3% 10/14: 26.5% 11/15: 24.38% 12/5: 26%; 05/09/2018: 29% 2/6: 23.75%    Time  8    Period  Weeks    Status  Partially Met    Target Date  08/26/18      PT LONG TERM GOAL #6   Title  Pt will be able to perform tub bench into/out of shower transfer without assist for improved independence with this task    Baseline  challenged with steps at this time, getting better, stands and gives sponge bath independently now  9/26: has not tried transfer yet 11/15: has not attempted yet; 05/09/2018: not attempted    Time  8   08/26/18   Period  Weeks    Status  Partially Met    Target Date  08/26/18      PT LONG TERM GOAL  #12   TITLE  Patient will negotiate one step ~4 inches to enter house independently to increase functional independence in natural environment.     Baseline  8/20: unable to do 9/26: patient too fatigued/not feeling well to perform 10/14: unable to perform at this time 11/15: unable to perform at this time; 12/5: able to lift 1 inch; 05/09/2018: unable to clear 4" step, able to lift foot ~ 1in    Time  8    Period  Weeks    Status  On-going    Target Date  08/26/18      PT  LONG TERM GOAL  #13   TITLE  Patient will perform STS from standard height surface to walker to allow patient to participate and transition in public areas when not in wheelchair.     Baseline  10/14: able to perform in W/C which is raised slightly 11/15: 20inch height 12/5: 18 inches from plinth table; 05/09/2018: 18 inches from plinth table2/6: 17 inch plinth table    Time  8    Period  Weeks    Status  Achieved      PT LONG TERM GOAL  #14   TITLE  Patient will stand 5 minutes to increase standing duration to allow patient to return to washing dishes and clothes.     Baseline  12/5: 56 seconds; 05/09/2018: 1 min 18 sec 2/6: 2 minutes last session    Time  8    Period  Weeks    Status  On-going    Target Date  08/26/18      PT LONG TERM GOAL  #15   TITLE  Patient will negotiate 30 ft of changing surfaces outside such as pavement and parking lot to allow patient to walk into church.     Baseline  12/5: does not walk outside 2/24: unable to test due to rain    Time  8    Period  Weeks    Status  On-going    Target Date  08/26/18            Plan - 07/10/18 1114    Clinical Impression Statement  Patient continues to progress with BLE strengthening and performance of standing exercises in order to improve functional mobility. Patient progressed to stepping over elevated object (yard stick) this session as compared to flat theraband used in previous sessions. Patient also demonstrates progression with core strengthening exercises as patient able to complete entire upper case alphabet using a light weighted ball without a rest break. Ultimately, patient continues to be more challenged with LLE coordination and strength. Today's session partially limited by patient's increased fatigue today, requiring seated rest breaks of longer duration. The patient will continue to benefit from skilled therapeutic intervention to address deficits in strength, mobility, balance, and function for improved  overall QOL and safety.     Rehab Potential  Good    PT Frequency  2x / week  PT Duration  8 weeks    PT Treatment/Interventions  ADLs/Self Care Home Management;Aquatic Therapy;Biofeedback;Cryotherapy;Electrical Stimulation;Iontophoresis 20m/ml Dexamethasone;Traction;Moist Heat;Ultrasound;DME Instruction;Gait training;Stair training;Functional mobility training;Therapeutic activities;Therapeutic exercise;Balance training;Neuromuscular re-education;Patient/family education;Orthotic Fit/Training;Wheelchair mobility training;Manual techniques;Compression bandaging;Passive range of motion;Dry needling;Energy conservation;Taping;Splinting    PT Next Visit Plan  continue strengthening/balance    PT Home Exercise Plan  to initiate next session (please incorporate hip flexor and adductor stretches in HEP if possible)    Consulted and Agree with Plan of Care  Patient       Patient will benefit from skilled therapeutic intervention in order to improve the following deficits and impairments:  Abnormal gait, Decreased activity tolerance, Decreased balance, Decreased coordination, Decreased endurance, Decreased knowledge of use of DME, Decreased mobility, Decreased range of motion, Decreased safety awareness, Decreased strength, Difficulty walking, Impaired perceived functional ability, Impaired sensation, Impaired tone, Impaired UE functional use, Improper body mechanics, Postural dysfunction  Visit Diagnosis: Muscle weakness (generalized)  Other abnormalities of gait and mobility  Unsteadiness on feet     Problem List Patient Active Problem List   Diagnosis Date Noted  . Abscess of female pelvis   . SVT (supraventricular tachycardia) ([place]   . Radial styloid tenosynovitis 03/12/2018  . Wheelchair confinement 02/27/2018  . Localized osteoporosis with current pathological fracture with routine healing 01/19/2017  . Wrist fracture 01/16/2017  . Sprain of ankle 03/23/2016  . Closed fracture of  lateral malleolus 03/16/2016  . Health care maintenance 01/24/2016  . Blood pressure elevated without history of HTN 10/25/2015  . Essential hypertension 10/25/2015  . Multiple sclerosis ([place] 10/02/2015  . Chronic left shoulder pain 07/19/2015  . Multiple sclerosis exacerbation ([place] 07/14/2015  . MS (multiple sclerosis) ([place] 11/26/2014  . Increased body mass index 11/26/2014  . HPV test positive 11/26/2014  . Status post laparoscopic supracervical hysterectomy 11/26/2014  . Galactorrhea 11/26/2014  . Back ache 05/21/2014  . Adiposity 05/21/2014  . Disordered sleep 05/21/2014  . Muscle spasticity 05/21/2014  . Spasticity 05/21/2014  . Calculus of kidney 12/09/2013  . Renal colic 093/71/6967 . Hypercholesteremia 08/19/2013  . Hereditary and idiopathic neuropathy 08/19/2013  . Hypercholesterolemia without hypertriglyceridemia 08/19/2013  . Bladder infection, chronic 07/25/2012  . Disorder of bladder function 07/25/2012  . Incomplete bladder emptying 07/25/2012  . Microscopic hematuria 07/25/2012  . Right upper quadrant pain 07/25/2012   ZOrlean Patten SPT  This entire session was performed under direct supervision and direction of a licensed therapist/therapist assistant . I have personally read, edited and approve of the note as written.  MJanna Arch PT, DPT   07/10/2018, 12:31 PM  [place] [address] [address]Phone: [phone]  Fax:  [phone] Name: Lisa RosadoRamseur MRN: [id number] of Birth: [birth year]

## 2018-07-11 ENCOUNTER — Ambulatory Visit: Payer: Medicare HMO

## 2018-07-12 ENCOUNTER — Ambulatory Visit: Payer: Medicare HMO

## 2018-07-12 DIAGNOSIS — M6281 Muscle weakness (generalized): Secondary | ICD-10-CM | POA: Diagnosis not present

## 2018-07-12 DIAGNOSIS — R2681 Unsteadiness on feet: Secondary | ICD-10-CM

## 2018-07-12 DIAGNOSIS — R2689 Other abnormalities of gait and mobility: Secondary | ICD-10-CM

## 2018-07-12 NOTE — Therapy (Signed)
[facility] [address] [place], [place], [address] Phone: [phone]   Fax:  [phone]  Physical Therapy Treatment  Patient Details  Name: Lisa Williams MRN: [id number] Date of Birth: 02/01/1961 Referring Provider (PT): Zachary Potter, MD   Encounter Date: 07/12/2018  PT End of Session - 07/12/18 0959    Visit Number  79    Number of Visits  94    Date for PT Re-Evaluation  08/26/18    Authorization Type  6/10 PN start 06/13/18    PT Start Time  0940    PT Stop Time  1030    PT Time Calculation (min)  50 min    Equipment Utilized During Treatment  Gait belt    Activity Tolerance  Patient limited by fatigue    Behavior During Therapy  WFL for tasks assessed/performed       Past Medical History:  Diagnosis Date  . Abdominal pain, right upper quadrant   . Back pain   . Calculus of kidney 12/09/2013  . Chronic back pain    unspecified  . Chronic left shoulder pain 07/19/2015  . Functional disorder of bladder    other  . Galactorrhea 11/26/2014   Chronic   . Hereditary and idiopathic neuropathy 08/19/2013  . HPV test positive   . Hypercholesteremia 08/19/2013  . Incomplete bladder emptying   . Microscopic hematuria   . MS (multiple sclerosis) (HCC)   . Muscle spasticity 05/21/2014  . Nonspecific findings on examination of urine    other  . Osteopenia   . Status post laparoscopic supracervical hysterectomy 11/26/2014  . Tobacco user 11/26/2014  . Wrist fracture     Past Surgical History:  Procedure Laterality Date  . bilateral tubal ligation  1996  . BREAST CYST EXCISION Left 2002  . KNEE SURGERY     right  . LAPAROSCOPIC SUPRACERVICAL HYSTERECTOMY  08/05/2013  . ORIF WRIST FRACTURE Left 01/17/2017   Procedure: OPEN REDUCTION INTERNAL FIXATION (ORIF) WRIST FRACTURE;  Surgeon: Bowers, James R, MD;  Location: [facility] ORS;  Service: Orthopedics;  Laterality: Left;  . TUBAL LIGATION Bilateral   . VAGINAL  HYSTERECTOMY  03/2006    There were no vitals filed for this visit.  Subjective Assessment - 07/12/18 0954    Subjective  Patient reports she is feeling more stiff today, was having difficulty lifting her legs today. Patient reports her Left leg is more stiff and difficult to move.     Pertinent History  Pt is a 58 y/o F who presents with BLE weakness and difficulty ambulating due to h/o MS that was diagnosed in 1995. Pt denies any pain associated. Pt was admitted to the hospital in November 2018 due to MS exacerbation. Nov-Dec to [facility] until end of Jan. After rehab the pt was able to ambulate up to 100 ft with the RW. Pt has Bil AFO, her therapist at [facility] thinks she needs a new AFO as her feet still drag when she ambulates, pt reports HHPT mentioned a spring AFO. Pt has custom AFOs that were originally made 4 years ago. Goals: improve balance, to be able to get into the shower with her tub bench without assist, to be able to get up 2 steps at sister's home with railings on both sides but too far apart to reach both sides, to be able to drive adapted car. Pt is staying at other sister's house with one step to get inside and currently picking   up L leg with UEs and then stepping in. Pt steps down leading with LLE. Pt wants to work on walking side to side as this is challenging and she had not yet progressed to this at rehab. Pt able to get in/out the car with assist only to bring BLEs into the car. Hoping to drive by June or July with adapted car (already has one). Pt needs assist bringing LEs into and out of hospital bed. Pt able to perform sit>stand without assist from [facility] with a pillow on the seat for a boost. Pt denies any falls in the past 6 months. Pt has been increasing her time alone at home up to a few hours at a time to become more independent. Pt has both manual and power WC. Uses power WC at home, uses manual WC out in community. Pt able to self propel manual WC. Pt needs assist  putting pants around ankles but she can pull them up. Pt can put on her shoes but needs help getting her leg up to do so. Pt able to tie her shoelaces on her own. Pt has a tub/shower unit at home with a tub bench. No grab bars in the shower. Has hand held shower head. Pt currently taking a sponge bath. Pt has a regular height toilet with a BSC over top. Pt with h/o L wrist fx with no precautions, pt wore her soft brace to evaluation for comfort but reports her wrist has healed and is doing well following OT.     Limitations  Lifting;Standing;Walking;House hold activities    How long can you sit comfortably?  n/a    How long can you stand comfortably?  3 minutes without UE assist, limited by fatigue    How long can you walk comfortably?  100 ft, limited by fatigue    Patient Stated Goals  see above    Currently in Pain?  No/denies        Ambulate with RW and wheelchair follow. Require verbal cueing for picking LLE for clearing foot. Noted stiffness of LLE compared to right limiting hip/knee flexion. X 30 ft. Performed second trial after supine stretches, improved clearance however noted continued drag of L foot .  Seated:  Balloon taps reaching inside and outside BOS 3 minutes for core and trunk stabilization.    Transition:  Sitting EOB to supine: supervision  Supine: Hamstring stretch 60 seconds 2x each LE Single knee to chest 60 seconds each LE.  Hooklying L knee adduction 60 seconds x2 trials  IT band stretch bilateral LE straight leg 60 seconds each LE Hooklying LE rotatation 60 seconds  Pt educated throughout session about proper posture and technique with exercises. Improved exercise technique, movement at target joints, use of target muscles after min verbal, visual, tactile cues  Seated rest breaks and use of ice pack utilized to keep body temperature at functional range to reduce MS exacerbation.                        PT Education - 07/12/18 0955     Education provided  Yes    Education Details  gait mechanics, weight shifting, muscle tissue lengthening    Person(s) Educated  Patient    Methods  Explanation;Demonstration;Tactile cues;Verbal cues    Comprehension  Verbalized understanding;Returned demonstration;Verbal cues required;Tactile cues required;Need further instruction       PT Short Term Goals - 07/01/18 1626      PT SHORT TERM GOAL #  1   Title  Pt will be completed HEP with min assist from caregiver at least 4 days/wk for improved carryover between sessions    Baseline  HEP compliant     Time  2    Period  Weeks    Status  Achieved      PT SHORT TERM GOAL #2   Title  Patient will report a worst pain of 6/10 on VAS in cervical spine to improve tolerance with ADLs and reduced symptoms with activities.     Baseline  4/30: 9/10 5/21: 0/10 pain    Time  2    Period  Weeks    Status  Achieved      PT SHORT TERM GOAL #3   Title  Patient will perform cervical rotation at least 50 degrees, flexion and extension to 70 degrees, and sidebending to 40 degrees without pain    Baseline  4/30: R 50 L 34 Rotation, R 26 L 35 sb, Extension 35 flex 35 ; 5/21: extension 46, SB R 41 L 40, rotation R 62 L 61,     Time  2    Period  Weeks    Status  Achieved        PT Long Term Goals - 07/01/18 0001      PT LONG TERM GOAL #2   Title  Pt's 10mWT will improve to at least 0.5 m/s for improved safety ambulating in home    Baseline  0.14 m/s; 5/15: .26m/s  7/10: .36 m/s  8/20: .42 m/s  9/26: 0.26m/s 10/14: .26 m/s improved hip and knee clearance 11/15: 0.28m/s improved hip and knee clearance; 12/5: .29 m/s improved step length and forward moment: decreased foot drag. 05/09/2018: 0.21 m/s 2/6: .237 m/s    Time  8    Period  Weeks    Status  Partially Met    Target Date  08/26/18      PT LONG TERM GOAL #3   Title  Pt's Bil hip F strength will improve to at least 2+/5 BLEs for improved gait mechanics and safety    Baseline  L: 2/5, R: 1/5  7/10: 2/5 bilaterally  8/20: 2/5 bilat 9/26: 2/5 bilat 10/14: 2/5 bilaterally ,improved hip flexor muscle activation 2/5: improved hip flexor and quad activation: 12/5: 2/5 ; 05/09/2018: 2/5 2/6: 3/5 knee, 2 to 2+/5 hip     Time  8    Period  Weeks    Status  Partially Met    Target Date  08/26/18      PT LONG TERM GOAL #4   Title  Pt's ABC Scale will improve to at least 40% to demonstrate improved balance    Baseline  28.13% 5/15; 24.4% 7/10: 23.4% 8/20: 22. 2% 9/26: 25.3% 10/14: 26.5% 11/15: 24.38% 12/5: 26%; 05/09/2018: 29% 2/6: 23.75%    Time  8    Period  Weeks    Status  Partially Met    Target Date  08/26/18      PT LONG TERM GOAL #6   Title  Pt will be able to perform tub bench into/out of shower transfer without assist for improved independence with this task    Baseline  challenged with steps at this time, getting better, stands and gives sponge bath independently now  9/26: has not tried transfer yet 11/15: has not attempted yet; 05/09/2018: not attempted    Time  8   08/26/18   Period  Weeks    Status  Partially   Met    Target Date  08/26/18      PT LONG TERM GOAL  #12   TITLE  Patient will negotiate one step ~4 inches to enter house independently to increase functional independence in natural environment.     Baseline  8/20: unable to do 9/26: patient too fatigued/not feeling well to perform 10/14: unable to perform at this time 11/15: unable to perform at this time; 12/5: able to lift 1 inch; 05/09/2018: unable to clear 4" step, able to lift foot ~ 1in    Time  8    Period  Weeks    Status  On-going    Target Date  08/26/18      PT LONG TERM GOAL  #13   TITLE  Patient will perform STS from standard height surface to walker to allow patient to participate and transition in public areas when not in wheelchair.     Baseline  10/14: able to perform in W/C which is raised slightly 11/15: 20inch height 12/5: 18 inches from plinth table; 05/09/2018: 18 inches from plinth table2/6: 17 inch  plinth table    Time  8    Period  Weeks    Status  Achieved      PT LONG TERM GOAL  #14   TITLE  Patient will stand 5 minutes to increase standing duration to allow patient to return to washing dishes and clothes.     Baseline  12/5: 56 seconds; 05/09/2018: 1 min 18 sec 2/6: 2 minutes last session    Time  8    Period  Weeks    Status  On-going    Target Date  08/26/18      PT LONG TERM GOAL  #15   TITLE  Patient will negotiate 30 ft of changing surfaces outside such as pavement and parking lot to allow patient to walk into church.     Baseline  12/5: does not walk outside 2/24: unable to test due to rain    Time  8    Period  Weeks    Status  On-going    Target Date  08/26/18            Plan - 07/12/18 1046    Clinical Impression Statement  Patient presents with excessive tightness of LLE>RLE. L hamstring and and adductor muscle tissue lengthening. Patient had relief of tension with prolonged holds resulting in improved muscle tissue lengthening.  Patient's ambulation was limited by adductor tightness altering normal gait mechanics. Patient will continue to benefit from skilled therapeutic intervention to address deficits in strength, monbility, balance, and function for improved overall QOL and safety    Rehab Potential  Good    PT Frequency  2x / week    PT Duration  8 weeks    PT Treatment/Interventions  ADLs/Self Care Home Management;Aquatic Therapy;Biofeedback;Cryotherapy;Electrical Stimulation;Iontophoresis 67m/ml Dexamethasone;Traction;Moist Heat;Ultrasound;DME Instruction;Gait training;Stair training;Functional mobility training;Therapeutic activities;Therapeutic exercise;Balance training;Neuromuscular re-education;Patient/family education;Orthotic Fit/Training;Wheelchair mobility training;Manual techniques;Compression bandaging;Passive range of motion;Dry needling;Energy conservation;Taping;Splinting    PT Next Visit Plan  continue strengthening/balance    PT Home Exercise  Plan  to initiate next session (please incorporate hip flexor and adductor stretches in HEP if possible)    Consulted and Agree with Plan of Care  Patient       Patient will benefit from skilled therapeutic intervention in order to improve the following deficits and impairments:  Abnormal gait, Decreased activity tolerance, Decreased balance, Decreased coordination, Decreased endurance, Decreased knowledge of use of DME, Decreased  mobility, Decreased range of motion, Decreased safety awareness, Decreased strength, Difficulty walking, Impaired perceived functional ability, Impaired sensation, Impaired tone, Impaired UE functional use, Improper body mechanics, Postural dysfunction  Visit Diagnosis: Muscle weakness (generalized)  Other abnormalities of gait and mobility  Unsteadiness on feet     Problem List Patient Active Problem List   Diagnosis Date Noted  . Abscess of female pelvis   . SVT (supraventricular tachycardia) (HCC)   . Radial styloid tenosynovitis 03/12/2018  . Wheelchair confinement 02/27/2018  . Localized osteoporosis with current pathological fracture with routine healing 01/19/2017  . Wrist fracture 01/16/2017  . Sprain of ankle 03/23/2016  . Closed fracture of lateral malleolus 03/16/2016  . Health care maintenance 01/24/2016  . Blood pressure elevated without history of HTN 10/25/2015  . Essential hypertension 10/25/2015  . Multiple sclerosis (HCC) 10/02/2015  . Chronic left shoulder pain 07/19/2015  . Multiple sclerosis exacerbation (HCC) 07/14/2015  . MS (multiple sclerosis) (HCC) 11/26/2014  . Increased body mass index 11/26/2014  . HPV test positive 11/26/2014  . Status post laparoscopic supracervical hysterectomy 11/26/2014  . Galactorrhea 11/26/2014  . Back ache 05/21/2014  . Adiposity 05/21/2014  . Disordered sleep 05/21/2014  . Muscle spasticity 05/21/2014  . Spasticity 05/21/2014  . Calculus of kidney 12/09/2013  . Renal colic 12/09/2013  .  Hypercholesteremia 08/19/2013  . Hereditary and idiopathic neuropathy 08/19/2013  . Hypercholesterolemia without hypertriglyceridemia 08/19/2013  . Bladder infection, chronic 07/25/2012  . Disorder of bladder function 07/25/2012  . Incomplete bladder emptying 07/25/2012  . Microscopic hematuria 07/25/2012  . Right upper quadrant pain 07/25/2012    , PT, DPT   07/12/2018, 10:48 AM  [facility] [address] [place], [place], [address] Phone: [phone]   Fax:  [phone]  Name: Kierstyn Renee Villegas MRN: [id number] Date of Birth: 01/04/1961   

## 2018-07-15 ENCOUNTER — Ambulatory Visit: Payer: Medicare HMO

## 2018-07-15 DIAGNOSIS — R2689 Other abnormalities of gait and mobility: Secondary | ICD-10-CM

## 2018-07-15 DIAGNOSIS — M6281 Muscle weakness (generalized): Secondary | ICD-10-CM

## 2018-07-15 DIAGNOSIS — R2681 Unsteadiness on feet: Secondary | ICD-10-CM

## 2018-07-15 NOTE — Therapy (Signed)
[place] MAIN [facility] SERVICES [address] [place], [place], [address] Phone: [phone]   Fax:  [phone]  Physical Therapy Treatment  Patient Details  Name: Lisa Williams MRN: [id number] Date of Birth: 1960/08/29 Referring Provider (PT): Gurney Maxin, MD   Encounter Date: 07/15/2018  PT End of Session - 07/15/18 1313    Visit Number  52    Number of Visits  7    Date for PT Re-Evaluation  08/26/18    Authorization Type  7/10 PN start 06/13/18    PT Start Time  1300    PT Stop Time  1344    PT Time Calculation (min)  44 min    Equipment Utilized During Treatment  Gait belt    Activity Tolerance  Patient limited by fatigue    Behavior During Therapy  [facility] for tasks assessed/performed       Past Medical History:  Diagnosis Date  . Abdominal pain, right upper quadrant   . Back pain   . Calculus of kidney 12/09/2013  . Chronic back pain    unspecified  . Chronic left shoulder pain 07/19/2015  . Functional disorder of bladder    other  . Galactorrhea 11/26/2014   Chronic   . Hereditary and idiopathic neuropathy 08/19/2013  . HPV test positive   . Hypercholesteremia 08/19/2013  . Incomplete bladder emptying   . Microscopic hematuria   . MS (multiple sclerosis) ([place])   . Muscle spasticity 05/21/2014  . Nonspecific findings on examination of urine    other  . Osteopenia   . Status post laparoscopic supracervical hysterectomy 11/26/2014  . Tobacco user 11/26/2014  . Wrist fracture     Past Surgical History:  Procedure Laterality Date  . bilateral tubal ligation  1996  . BREAST CYST EXCISION Left 2002  . KNEE SURGERY     right  . LAPAROSCOPIC SUPRACERVICAL HYSTERECTOMY  08/05/2013  . ORIF WRIST FRACTURE Left 01/17/2017   Procedure: OPEN REDUCTION INTERNAL FIXATION (ORIF) WRIST FRACTURE;  Surgeon: Lovell Sheehan, MD;  Location: [facility] ORS;  Service: Orthopedics;  Laterality: Left;  . TUBAL LIGATION Bilateral   . VAGINAL  HYSTERECTOMY  03/2006    There were no vitals filed for this visit.  Subjective Assessment - 07/15/18 1308    Subjective  Patient presents after a good weekend. Still feeling a little stiff in LLE, is better but not fully back to normal. No falls or LOB since last session.     Pertinent History  Pt is a 58 y/o F who presents with BLE weakness and difficulty ambulating due to h/o MS that was diagnosed in 1995. Pt denies any pain associated. Pt was admitted to the hospital in November 2018 due to MS exacerbation. Nov-Dec to HHPT-->[facility] until end of Jan. After rehab the pt was able to ambulate up to 100 ft with the RW. Pt has Bil AFO, her therapist at [place] thinks she needs a new AFO as her feet still drag when she ambulates, pt reports HHPT mentioned a spring AFO. Pt has custom AFOs that were originally made 4 years ago. Goals: improve balance, to be able to get into the shower with her tub bench without assist, to be able to get up 2 steps at sister's home with railings on both sides but too far apart to reach both sides, to be able to drive adapted car. Pt is staying at other sister's house with one step to get inside and currently  picking up L leg with UEs and then stepping in. Pt steps down leading with LLE. Pt wants to work on walking side to side as this is challenging and she had not yet progressed to this at rehab. Pt able to get in/out the car with assist only to bring BLEs into the car. Hoping to drive by June or July with adapted car (already has one). Pt needs assist bringing LEs into and out of hospital bed. Pt able to perform sit>stand without assist from [facility] with a pillow on the seat for a boost. Pt denies any falls in the past 6 months. Pt has been increasing her time alone at home up to a few hours at a time to become more independent. Pt has both manual and power WC. Uses power WC at home, uses manual WC out in community. Pt able to self propel manual WC. Pt needs assist putting  pants around ankles but she can pull them up. Pt can put on her shoes but needs help getting her leg up to do so. Pt able to tie her shoelaces on her own. Pt has a tub/shower unit at home with a tub bench. No grab bars in the shower. Has hand held shower head. Pt currently taking a sponge bath. Pt has a regular height toilet with a BSC over top. Pt with h/o L wrist fx with no precautions, pt wore her soft brace to evaluation for comfort but reports her wrist has healed and is doing well following OT.     Limitations  Lifting;Standing;Walking;House hold activities    How long can you sit comfortably?  n/a    How long can you stand comfortably?  3 minutes without UE assist, limited by fatigue    How long can you walk comfortably?  100 ft, limited by fatigue    Patient Stated Goals  see above    Currently in Pain?  No/denies       // bars: weight shiftL and R with focus on hip momentum rather than usage of UE for pre-gait interventions.  Step over yard stick in // bars 5x each LE, clearing full width of band for five consecutive times for R,5consecutive times LLE.; CGA with verbal cueing for weight shift. First time patient has been able to [facility]  Standing tolerance; 1 minute 11 seconds.   Seated:  Weighted ball (1000gr) ABC for trunk stability.   isometric adduction against PT hand 12x 3 second holds  Isometric abduction against PT hand 12x 3 second holds  Isometric hip flexion against PT hand 12x 3 second holds  Balloon taps reaching inside and outside BOS 3 minutes for core and trunk stabilization.    seated hamstring stretch 60 seconds each LE, PT guidance for maximal tissue lengthening  Pt educated throughout session about proper posture and technique with exercises. Improved exercise technique, movement at target joints, use of target muscles after minverbal, visual, tactile cues  Seated rest breaks and use of ice pack utilized to keep body temperature at functional  range to reduce MS exacerbation.                         PT Education - 07/15/18 1312    Education provided  Yes    Education Details  exercise technique, stability, muscle tissue lengthening     Person(s) Educated  Patient    Methods  Explanation;Demonstration;Tactile cues;Verbal cues    Comprehension  Verbalized understanding;Returned demonstration;Verbal cues required;Tactile cues  required;Need further instruction       PT Short Term Goals - 07/01/18 1626      PT SHORT TERM GOAL #1   Title  Pt will be completed HEP with min assist from caregiver at least 4 days/wk for improved carryover between sessions    Baseline  HEP compliant     Time  2    Period  Weeks    Status  Achieved      PT SHORT TERM GOAL #2   Title  Patient will report a worst pain of 6/10 on VAS in cervical spine to improve tolerance with ADLs and reduced symptoms with activities.     Baseline  4/30: 9/10 5/21: 0/10 pain    Time  2    Period  Weeks    Status  Achieved      PT SHORT TERM GOAL #3   Title  Patient will perform cervical rotation at least 50 degrees, flexion and extension to 70 degrees, and sidebending to 40 degrees without pain    Baseline  4/30: R 50 L 34 Rotation, R 26 L 35 sb, Extension 35 flex 35 ; 5/21: extension 46, SB R 41 L 40, rotation R 62 L 61,     Time  2    Period  Weeks    Status  Achieved        PT Long Term Goals - 07/01/18 0001      PT LONG TERM GOAL #2   Title  Pt's 53mT will improve to at least 0.5 m/s for improved safety ambulating in home    Baseline  0.14 m/s; 5/15: .259m  7/10: .36 m/s  8/20: .42 m/s  9/26: 0.2633m10/14: .26 m/s improved hip and knee clearance 11/15: 0.52m30mmproved hip and knee clearance; 12/5: .29 m/s improved step length and forward moment: decreased foot drag. 05/09/2018: 0.21 m/s 2/6: .237 m/s    Time  8    Period  Weeks    Status  Partially Met    Target Date  08/26/18      PT LONG TERM GOAL #3   Title  Pt's Bil hip F  strength will improve to at least 2+/5 BLEs for improved gait mechanics and safety    Baseline  L: 2/5, R: 1/5 7/10: 2/5 bilaterally  8/20: 2/5 bilat 9/26: 2/5 bilat 10/14: 2/5 bilaterally ,improved hip flexor muscle activation 2/5: improved hip flexor and quad activation: 12/5: 2/5 ; 05/09/2018: 2/5 2/6: 3/5 knee, 2 to 2+/5 hip     Time  8    Period  Weeks    Status  Partially Met    Target Date  08/26/18      PT LONG TERM GOAL #4   Title  Pt's ABC Scale will improve to at least 40% to demonstrate improved balance    Baseline  28.13% 5/15; 24.4% 7/10: 23.4% 8/20: 22. 2% 9/26: 25.3% 10/14: 26.5% 11/15: 24.38% 12/5: 26%; 05/09/2018: 29% 2/6: 23.75%    Time  8    Period  Weeks    Status  Partially Met    Target Date  08/26/18      PT LONG TERM GOAL #6   Title  Pt will be able to perform tub bench into/out of shower transfer without assist for improved independence with this task    Baseline  challenged with steps at this time, getting better, stands and gives sponge bath independently now  9/26: has not tried transfer yet 11/15: has not  attempted yet; 05/09/2018: not attempted    Time  8   08/26/18   Period  Weeks    Status  Partially Met    Target Date  08/26/18      PT LONG TERM GOAL  #12   TITLE  Patient will negotiate one step ~4 inches to enter house independently to increase functional independence in natural environment.     Baseline  8/20: unable to do 9/26: patient too fatigued/not feeling well to perform 10/14: unable to perform at this time 11/15: unable to perform at this time; 12/5: able to lift 1 inch; 05/09/2018: unable to clear 4" step, able to lift foot ~ 1in    Time  8    Period  Weeks    Status  On-going    Target Date  08/26/18      PT LONG TERM GOAL  #13   TITLE  Patient will perform STS from standard height surface to walker to allow patient to participate and transition in public areas when not in wheelchair.     Baseline  10/14: able to perform in W/C which is raised  slightly 11/15: 20inch height 12/5: 18 inches from plinth table; 05/09/2018: 18 inches from plinth table2/6: 17 inch plinth table    Time  8    Period  Weeks    Status  Achieved      PT LONG TERM GOAL  #14   TITLE  Patient will stand 5 minutes to increase standing duration to allow patient to return to washing dishes and clothes.     Baseline  12/5: 56 seconds; 05/09/2018: 1 min 18 sec 2/6: 2 minutes last session    Time  8    Period  Weeks    Status  On-going    Target Date  08/26/18      PT LONG TERM GOAL  #15   TITLE  Patient will negotiate 30 ft of changing surfaces outside such as pavement and parking lot to allow patient to walk into church.     Baseline  12/5: does not walk outside 2/24: unable to test due to rain    Time  8    Period  Weeks    Status  On-going    Target Date  08/26/18            Plan - 07/15/18 1348    Clinical Impression Statement  Patient more challenged with core temperature regulation today requiring more frequent rest breaks and use of ice. Patient challenged with prolonged standing today with fatigue of LLE>RLE resulting in need to sit down. Patient will continue to benefit from skilled therapeutic intervention to address deficits in strength, monbility, balance, and function for improved overall QOL and safety    Rehab Potential  Good    PT Frequency  2x / week    PT Duration  8 weeks    PT Treatment/Interventions  ADLs/Self Care Home Management;Aquatic Therapy;Biofeedback;Cryotherapy;Electrical Stimulation;Iontophoresis 36m/ml Dexamethasone;Traction;Moist Heat;Ultrasound;DME Instruction;Gait training;Stair training;Functional mobility training;Therapeutic activities;Therapeutic exercise;Balance training;Neuromuscular re-education;Patient/family education;Orthotic Fit/Training;Wheelchair mobility training;Manual techniques;Compression bandaging;Passive range of motion;Dry needling;Energy conservation;Taping;Splinting    PT Next Visit Plan  continue  strengthening/balance    PT Home Exercise Plan  to initiate next session (please incorporate hip flexor and adductor stretches in HEP if possible)    Consulted and Agree with Plan of Care  Patient       Patient will benefit from skilled therapeutic intervention in order to improve the following deficits and impairments:  Abnormal  gait, Decreased activity tolerance, Decreased balance, Decreased coordination, Decreased endurance, Decreased knowledge of use of DME, Decreased mobility, Decreased range of motion, Decreased safety awareness, Decreased strength, Difficulty walking, Impaired perceived functional ability, Impaired sensation, Impaired tone, Impaired UE functional use, Improper body mechanics, Postural dysfunction  Visit Diagnosis: Muscle weakness (generalized)  Other abnormalities of gait and mobility  Unsteadiness on feet     Problem List Patient Active Problem List   Diagnosis Date Noted  . Abscess of female pelvis   . SVT (supraventricular tachycardia) ([place])   . Radial styloid tenosynovitis 03/12/2018  . Wheelchair confinement 02/27/2018  . Localized osteoporosis with current pathological fracture with routine healing 01/19/2017  . Wrist fracture 01/16/2017  . Sprain of ankle 03/23/2016  . Closed fracture of lateral malleolus 03/16/2016  . Health care maintenance 01/24/2016  . Blood pressure elevated without history of HTN 10/25/2015  . Essential hypertension 10/25/2015  . Multiple sclerosis ([place]) 10/02/2015  . Chronic left shoulder pain 07/19/2015  . Multiple sclerosis exacerbation ([place]) 07/14/2015  . MS (multiple sclerosis) ([place]) 11/26/2014  . Increased body mass index 11/26/2014  . HPV test positive 11/26/2014  . Status post laparoscopic supracervical hysterectomy 11/26/2014  . Galactorrhea 11/26/2014  . Back ache 05/21/2014  . Adiposity 05/21/2014  . Disordered sleep 05/21/2014  . Muscle spasticity 05/21/2014  . Spasticity 05/21/2014  . Calculus of kidney  12/09/2013  . Renal colic 84/57/3344  . Hypercholesteremia 08/19/2013  . Hereditary and idiopathic neuropathy 08/19/2013  . Hypercholesterolemia without hypertriglyceridemia 08/19/2013  . Bladder infection, chronic 07/25/2012  . Disorder of bladder function 07/25/2012  . Incomplete bladder emptying 07/25/2012  . Microscopic hematuria 07/25/2012  . Right upper quadrant pain 07/25/2012   Janna Arch, PT, DPT   07/15/2018, 2:08 PM  [place] MAIN [facility] SERVICES [address] [place], [place], [address] Phone: [phone]   Fax:  [phone]  Name: Lisa Williams MRN: [id number] Date of Birth: [birth year]

## 2018-07-16 ENCOUNTER — Ambulatory Visit: Payer: Medicare HMO

## 2018-07-16 DIAGNOSIS — M6281 Muscle weakness (generalized): Secondary | ICD-10-CM

## 2018-07-16 DIAGNOSIS — R2681 Unsteadiness on feet: Secondary | ICD-10-CM

## 2018-07-16 DIAGNOSIS — R2689 Other abnormalities of gait and mobility: Secondary | ICD-10-CM

## 2018-07-16 NOTE — Therapy (Signed)
[place] MAIN [facility] SERVICES [address] [place], [place], [address] Phone: [phone]   Fax:  [phone]  Physical Therapy Treatment  Patient Details  Name: Lisa Williams MRN: [id number] Date of Birth: 11-08-1960 Referring Provider (PT): Gurney Maxin, MD   Encounter Date: 07/16/2018  PT End of Session - 07/16/18 1204    Visit Number  81    Number of Visits  91    Date for PT Re-Evaluation  08/26/18    Authorization Type  8/10 PN start 06/13/18    PT Start Time  1100    PT Stop Time  1141    PT Time Calculation (min)  41 min    Equipment Utilized During Treatment  Gait belt    Activity Tolerance  Patient limited by fatigue    Behavior During Therapy  [facility] for tasks assessed/performed       Past Medical History:  Diagnosis Date  . Abdominal pain, right upper quadrant   . Back pain   . Calculus of kidney 12/09/2013  . Chronic back pain    unspecified  . Chronic left shoulder pain 07/19/2015  . Functional disorder of bladder    other  . Galactorrhea 11/26/2014   Chronic   . Hereditary and idiopathic neuropathy 08/19/2013  . HPV test positive   . Hypercholesteremia 08/19/2013  . Incomplete bladder emptying   . Microscopic hematuria   . MS (multiple sclerosis) ([place])   . Muscle spasticity 05/21/2014  . Nonspecific findings on examination of urine    other  . Osteopenia   . Status post laparoscopic supracervical hysterectomy 11/26/2014  . Tobacco user 11/26/2014  . Wrist fracture     Past Surgical History:  Procedure Laterality Date  . bilateral tubal ligation  1996  . BREAST CYST EXCISION Left 2002  . KNEE SURGERY     right  . LAPAROSCOPIC SUPRACERVICAL HYSTERECTOMY  08/05/2013  . ORIF WRIST FRACTURE Left 01/17/2017   Procedure: OPEN REDUCTION INTERNAL FIXATION (ORIF) WRIST FRACTURE;  Surgeon: Lovell Sheehan, MD;  Location: [facility] ORS;  Service: Orthopedics;  Laterality: Left;  . TUBAL LIGATION Bilateral   . VAGINAL  HYSTERECTOMY  03/2006    There were no vitals filed for this visit.  Subjective Assessment - 07/16/18 1203    Subjective  Patient presents with minimal soreness after session yesterday. Had an easy morning to prepare for PT. No falls or LOB since last session.     Pertinent History  Pt is a 58 y/o F who presents with BLE weakness and difficulty ambulating due to h/o MS that was diagnosed in 1995. Pt denies any pain associated. Pt was admitted to the hospital in November 2018 due to MS exacerbation. Nov-Dec to HHPT-->[facility] until end of Jan. After rehab the pt was able to ambulate up to 100 ft with the RW. Pt has Bil AFO, her therapist at [place] thinks she needs a new AFO as her feet still drag when she ambulates, pt reports HHPT mentioned a spring AFO. Pt has custom AFOs that were originally made 4 years ago. Goals: improve balance, to be able to get into the shower with her tub bench without assist, to be able to get up 2 steps at sister's home with railings on both sides but too far apart to reach both sides, to be able to drive adapted car. Pt is staying at other sister's house with one step to get inside and currently picking up L leg with  UEs and then stepping in. Pt steps down leading with LLE. Pt wants to work on walking side to side as this is challenging and she had not yet progressed to this at rehab. Pt able to get in/out the car with assist only to bring BLEs into the car. Hoping to drive by June or July with adapted car (already has one). Pt needs assist bringing LEs into and out of hospital bed. Pt able to perform sit>stand without assist from [facility] with a pillow on the seat for a boost. Pt denies any falls in the past 6 months. Pt has been increasing her time alone at home up to a few hours at a time to become more independent. Pt has both manual and power WC. Uses power WC at home, uses manual WC out in community. Pt able to self propel manual WC. Pt needs assist putting pants around  ankles but she can pull them up. Pt can put on her shoes but needs help getting her leg up to do so. Pt able to tie her shoelaces on her own. Pt has a tub/shower unit at home with a tub bench. No grab bars in the shower. Has hand held shower head. Pt currently taking a sponge bath. Pt has a regular height toilet with a BSC over top. Pt with h/o L wrist fx with no precautions, pt wore her soft brace to evaluation for comfort but reports her wrist has healed and is doing well following OT.     Limitations  Lifting;Standing;Walking;House hold activities    How long can you sit comfortably?  n/a    How long can you stand comfortably?  3 minutes without UE assist, limited by fatigue    How long can you walk comfortably?  100 ft, limited by fatigue    Patient Stated Goals  see above    Currently in Pain?  No/denies       Ambulate in hallway with RW and CGA with wheelchair follow. Initial slight scuffing of BLE's that improved with verbal cueing for hip flexion and weight shift. Left foot external rotation progressively worsening with fatigue. Patient challenged with fatigue after 40 feet.   Stand pivot transfer x 2 trials with decreased use of arms, increased LE usage.   Seated:   Lateral trunk stretch, bring opposite arm overhead reaching towards other side while maintaining upright position without UE support for core stability challenge 10x each side for 5 second holds  Seated leg extension (LAQ) 10x each LE; 2 sets with visual cueing for leg position   10x clockwise, 10x counterclockwise lateral arm circles in full abduction at 90 degrees , slow circles.    seated: ER IR  From raised plinth table to decrease scuffing : step out then in 10 x each LE, easier on RLE than LLE  Seated gluteal squeezes 10x 3 second holds  Balloon taps reaching inside and outside BOS 3 minutes for core and trunk stabilization.      seated hamstring stretch 60 seconds each LE, PT guidance for maximal tissue  lengthening   Pt educated throughout session about proper posture and technique with exercises. Improved exercise technique, movement at target joints, use of target muscles after min verbal, visual, tactile cues   Seated rest breaks and use of ice pack utilized to keep body temperature at functional range to reduce MS exacerbation.  PT Education - 07/16/18 1203    Education provided  Yes    Education Details  exercise technique, stability, COM     Person(s) Educated  Patient    Methods  Explanation;Demonstration;Tactile cues;Verbal cues    Comprehension  Verbalized understanding;Returned demonstration;Verbal cues required;Tactile cues required;Need further instruction       PT Short Term Goals - 07/01/18 1626      PT SHORT TERM GOAL #1   Title  Pt will be completed HEP with min assist from caregiver at least 4 days/wk for improved carryover between sessions    Baseline  HEP compliant     Time  2    Period  Weeks    Status  Achieved      PT SHORT TERM GOAL #2   Title  Patient will report a worst pain of 6/10 on VAS in cervical spine to improve tolerance with ADLs and reduced symptoms with activities.     Baseline  4/30: 9/10 5/21: 0/10 pain    Time  2    Period  Weeks    Status  Achieved      PT SHORT TERM GOAL #3   Title  Patient will perform cervical rotation at least 50 degrees, flexion and extension to 70 degrees, and sidebending to 40 degrees without pain    Baseline  4/30: R 50 L 34 Rotation, R 26 L 35 sb, Extension 35 flex 35 ; 5/21: extension 46, SB R 41 L 40, rotation R 62 L 61,     Time  2    Period  Weeks    Status  Achieved        PT Long Term Goals - 07/01/18 0001      PT LONG TERM GOAL #2   Title  Pt's 56mT will improve to at least 0.5 m/s for improved safety ambulating in home    Baseline  0.14 m/s; 5/15: .270m  7/10: .36 m/s  8/20: .42 m/s  9/26: 0.2670m10/14: .26 m/s improved hip and knee clearance 11/15:  0.36m81mmproved hip and knee clearance; 12/5: .29 m/s improved step length and forward moment: decreased foot drag. 05/09/2018: 0.21 m/s 2/6: .237 m/s    Time  8    Period  Weeks    Status  Partially Met    Target Date  08/26/18      PT LONG TERM GOAL #3   Title  Pt's Bil hip F strength will improve to at least 2+/5 BLEs for improved gait mechanics and safety    Baseline  L: 2/5, R: 1/5 7/10: 2/5 bilaterally  8/20: 2/5 bilat 9/26: 2/5 bilat 10/14: 2/5 bilaterally ,improved hip flexor muscle activation 2/5: improved hip flexor and quad activation: 12/5: 2/5 ; 05/09/2018: 2/5 2/6: 3/5 knee, 2 to 2+/5 hip     Time  8    Period  Weeks    Status  Partially Met    Target Date  08/26/18      PT LONG TERM GOAL #4   Title  Pt's ABC Scale will improve to at least 40% to demonstrate improved balance    Baseline  28.13% 5/15; 24.4% 7/10: 23.4% 8/20: 22. 2% 9/26: 25.3% 10/14: 26.5% 11/15: 24.38% 12/5: 26%; 05/09/2018: 29% 2/6: 23.75%    Time  8    Period  Weeks    Status  Partially Met    Target Date  08/26/18      PT LONG TERM GOAL #6   Title  Pt  will be able to perform tub bench into/out of shower transfer without assist for improved independence with this task    Baseline  challenged with steps at this time, getting better, stands and gives sponge bath independently now  9/26: has not tried transfer yet 11/15: has not attempted yet; 05/09/2018: not attempted    Time  8   08/26/18   Period  Weeks    Status  Partially Met    Target Date  08/26/18      PT LONG TERM GOAL  #12   TITLE  Patient will negotiate one step ~4 inches to enter house independently to increase functional independence in natural environment.     Baseline  8/20: unable to do 9/26: patient too fatigued/not feeling well to perform 10/14: unable to perform at this time 11/15: unable to perform at this time; 12/5: able to lift 1 inch; 05/09/2018: unable to clear 4" step, able to lift foot ~ 1in    Time  8    Period  Weeks    Status   On-going    Target Date  08/26/18      PT LONG TERM GOAL  #13   TITLE  Patient will perform STS from standard height surface to walker to allow patient to participate and transition in public areas when not in wheelchair.     Baseline  10/14: able to perform in W/C which is raised slightly 11/15: 20inch height 12/5: 18 inches from plinth table; 05/09/2018: 18 inches from plinth table2/6: 17 inch plinth table    Time  8    Period  Weeks    Status  Achieved      PT LONG TERM GOAL  #14   TITLE  Patient will stand 5 minutes to increase standing duration to allow patient to return to washing dishes and clothes.     Baseline  12/5: 56 seconds; 05/09/2018: 1 min 18 sec 2/6: 2 minutes last session    Time  8    Period  Weeks    Status  On-going    Target Date  08/26/18      PT LONG TERM GOAL  #15   TITLE  Patient will negotiate 30 ft of changing surfaces outside such as pavement and parking lot to allow patient to walk into church.     Baseline  12/5: does not walk outside 2/24: unable to test due to rain    Time  8    Period  Weeks    Status  On-going    Target Date  08/26/18            Plan - 07/16/18 1204    Clinical Impression Statement   Patient presents with good motivation to today's session. Patient challenged with end range center of mass movement resulting in occasional need for assistance to return to upright posture. Patient overheated frequently today resulting in need for rest break. Patient will continue to benefit from skilled therapeutic intervention to address deficits in strength, monbility, balance, and function for improved overall QOL and safety    Rehab Potential  Good    PT Frequency  2x / week    PT Duration  8 weeks    PT Treatment/Interventions  ADLs/Self Care Home Management;Aquatic Therapy;Biofeedback;Cryotherapy;Electrical Stimulation;Iontophoresis 106m/ml Dexamethasone;Traction;Moist Heat;Ultrasound;DME Instruction;Gait training;Stair training;Functional  mobility training;Therapeutic activities;Therapeutic exercise;Balance training;Neuromuscular re-education;Patient/family education;Orthotic Fit/Training;Wheelchair mobility training;Manual techniques;Compression bandaging;Passive range of motion;Dry needling;Energy conservation;Taping;Splinting    PT Next Visit Plan  continue strengthening/balance    PT  Home Exercise Plan  to initiate next session (please incorporate hip flexor and adductor stretches in HEP if possible)    Consulted and Agree with Plan of Care  Patient       Patient will benefit from skilled therapeutic intervention in order to improve the following deficits and impairments:  Abnormal gait, Decreased activity tolerance, Decreased balance, Decreased coordination, Decreased endurance, Decreased knowledge of use of DME, Decreased mobility, Decreased range of motion, Decreased safety awareness, Decreased strength, Difficulty walking, Impaired perceived functional ability, Impaired sensation, Impaired tone, Impaired UE functional use, Improper body mechanics, Postural dysfunction  Visit Diagnosis: Muscle weakness (generalized)  Other abnormalities of gait and mobility  Unsteadiness on feet     Problem List Patient Active Problem List   Diagnosis Date Noted  . Abscess of female pelvis   . SVT (supraventricular tachycardia) ([place])   . Radial styloid tenosynovitis 03/12/2018  . Wheelchair confinement 02/27/2018  . Localized osteoporosis with current pathological fracture with routine healing 01/19/2017  . Wrist fracture 01/16/2017  . Sprain of ankle 03/23/2016  . Closed fracture of lateral malleolus 03/16/2016  . Health care maintenance 01/24/2016  . Blood pressure elevated without history of HTN 10/25/2015  . Essential hypertension 10/25/2015  . Multiple sclerosis ([place]) 10/02/2015  . Chronic left shoulder pain 07/19/2015  . Multiple sclerosis exacerbation ([place]) 07/14/2015  . MS (multiple sclerosis) ([place]) 11/26/2014  .  Increased body mass index 11/26/2014  . HPV test positive 11/26/2014  . Status post laparoscopic supracervical hysterectomy 11/26/2014  . Galactorrhea 11/26/2014  . Back ache 05/21/2014  . Adiposity 05/21/2014  . Disordered sleep 05/21/2014  . Muscle spasticity 05/21/2014  . Spasticity 05/21/2014  . Calculus of kidney 12/09/2013  . Renal colic 90/90/3014  . Hypercholesteremia 08/19/2013  . Hereditary and idiopathic neuropathy 08/19/2013  . Hypercholesterolemia without hypertriglyceridemia 08/19/2013  . Bladder infection, chronic 07/25/2012  . Disorder of bladder function 07/25/2012  . Incomplete bladder emptying 07/25/2012  . Microscopic hematuria 07/25/2012  . Right upper quadrant pain 07/25/2012    Janna Arch, PT, DPT   07/16/2018, 12:05 PM  [place] MAIN [facility] SERVICES [address] [place], [place], [address] Phone: [phone]   Fax:  [phone]  Name: Marijane Trower Denomme MRN: [id number] Date of Birth: 1960/06/06

## 2018-07-17 ENCOUNTER — Ambulatory Visit: Payer: Medicare HMO

## 2018-07-18 ENCOUNTER — Ambulatory Visit: Payer: Medicare HMO | Admitting: Physical Therapy

## 2018-07-18 ENCOUNTER — Ambulatory Visit: Payer: Medicare HMO

## 2018-07-22 ENCOUNTER — Ambulatory Visit: Payer: Medicare HMO

## 2018-07-22 ENCOUNTER — Other Ambulatory Visit: Payer: Self-pay

## 2018-07-22 DIAGNOSIS — R2681 Unsteadiness on feet: Secondary | ICD-10-CM

## 2018-07-22 DIAGNOSIS — M6281 Muscle weakness (generalized): Secondary | ICD-10-CM

## 2018-07-22 DIAGNOSIS — R2689 Other abnormalities of gait and mobility: Secondary | ICD-10-CM

## 2018-07-22 NOTE — Therapy (Addendum)
[place] MAIN [facility] SERVICES [place], [place], [address] Phone: [phone]   Fax:  [phone]  Physical Therapy Treatment  Patient Details  Name: Lisa Williams MRN: [id number] Date of Birth: 1961/04/07 Referring Provider (PT): Gurney Maxin, MD   Encounter Date: 07/22/2018  PT End of Session - 07/22/18 1048    Visit Number  [id number]    Number of Visits  8    Date for PT Re-Evaluation  08/26/18    Authorization Type  9/10 PN start 06/13/18    PT Start Time  0931    PT Stop Time  1012    PT Time Calculation (min)  41 min    Equipment Utilized During Treatment  Gait belt    Activity Tolerance  Patient tolerated treatment well    Behavior During Therapy  [facility] for tasks assessed/performed       Past Medical History:  Diagnosis Date  . Abdominal pain, right upper quadrant   . Back pain   . Calculus of kidney 12/09/2013  . Chronic back pain    unspecified  . Chronic left shoulder pain 07/19/2015  . Functional disorder of bladder    other  . Galactorrhea 11/26/2014   Chronic   . Hereditary and idiopathic neuropathy 08/19/2013  . HPV test positive   . Hypercholesteremia 08/19/2013  . Incomplete bladder emptying   . Microscopic hematuria   . MS (multiple sclerosis) ([place])   . Muscle spasticity 05/21/2014  . Nonspecific findings on examination of urine    other  . Osteopenia   . Status post laparoscopic supracervical hysterectomy 11/26/2014  . Tobacco user 11/26/2014  . Wrist fracture     Past Surgical History:  Procedure Laterality Date  . bilateral tubal ligation  1996  . BREAST CYST EXCISION Left 2002  . KNEE SURGERY     right  . LAPAROSCOPIC SUPRACERVICAL HYSTERECTOMY  08/05/2013  . ORIF WRIST FRACTURE Left 01/17/2017   Procedure: OPEN REDUCTION INTERNAL FIXATION (ORIF) WRIST FRACTURE;  Surgeon: Lovell Sheehan, MD;  Location: [facility] ORS;  Service: Orthopedics;  Laterality: Left;  . TUBAL LIGATION Bilateral   .  VAGINAL HYSTERECTOMY  03/2006    There were no vitals filed for this visit.  Subjective Assessment - 07/22/18 1046    Subjective  The patient notes that she is doing well today and feels good. Denies pain. No LOB/falls since last visit. Notes that she has been walking around her home more often which she thinks has helped her a lot. Notes that she feels that her core/abdominals are stronger.    Pertinent History  Pt is a 58 y/o F who presents with BLE weakness and difficulty ambulating due to h/o MS that was diagnosed in 1995. Pt denies any pain associated. Pt was admitted to the hospital in November 2018 due to MS exacerbation. Nov-Dec to HHPT-->[facility] until end of Jan. After rehab the pt was able to ambulate up to 100 ft with the RW. Pt has Bil AFO, her therapist at [facility] thinks she needs a new AFO as her feet still drag when she ambulates, pt reports HHPT mentioned a spring AFO. Pt has custom AFOs that were originally made 4 years ago. Goals: improve balance, to be able to get into the shower with her tub bench without assist, to be able to get up 2 steps at sister's home with railings on both sides but too far apart to reach both sides, to be able to  drive adapted car. Pt is staying at other sister's house with one step to get inside and currently picking up L leg with UEs and then stepping in. Pt steps down leading with LLE. Pt wants to work on walking side to side as this is challenging and she had not yet progressed to this at rehab. Pt able to get in/out the car with assist only to bring BLEs into the car. Hoping to drive by June or July with adapted car (already has one). Pt needs assist bringing LEs into and out of hospital bed. Pt able to perform sit>stand without assist from [facility] with a pillow on the seat for a boost. Pt denies any falls in the past 6 months. Pt has been increasing her time alone at home up to a few hours at a time to become more independent. Pt has both manual and power  WC. Uses power WC at home, uses manual WC out in community. Pt able to self propel manual WC. Pt needs assist putting pants around ankles but she can pull them up. Pt can put on her shoes but needs help getting her leg up to do so. Pt able to tie her shoelaces on her own. Pt has a tub/shower unit at home with a tub bench. No grab bars in the shower. Has hand held shower head. Pt currently taking a sponge bath. Pt has a regular height toilet with a BSC over top. Pt with h/o L wrist fx with no precautions, pt wore her soft brace to evaluation for comfort but reports her wrist has healed and is doing well following OT.     Limitations  Lifting;Standing;Walking;House hold activities    How long can you sit comfortably?  n/a    How long can you stand comfortably?  3 minutes without UE assist, limited by fatigue    How long can you walk comfortably?  100 ft, limited by fatigue    Patient Stated Goals  see above    Currently in Pain?  No/denies       TherEx:  Ambulate with RW and wheelchair follow 2x35 feet. CGA. Requires rare verbal cueing for increased weight shift and picking up LLE for clearing foot. Noted very minimal stiffness of LLE compared to right limiting hip/knee flexion. Patient demonstrates improved weight shifting and more consistent step length as compared to previous sessions.  Seated: Alternating arm up/down pumps through short range with elbows extended at 90 degrees shoulder flexion for core and trunk stabilization x10; patient benefits from rare verbal cues to sit up tall and breathe during performance  Alternating arm side to side pumps (shoulder horizontal abduction/adduction) through short range with elbows extended in 'scaption' plane at 90 degrees shoulder flexion for core and trunk stabilization x10; patient benefits from rare verbal cues to sit up tall and breathe during peformance  Balloon taps reaching inside and outside BOS 2 x 2 minutes for core and trunk stabilization    Isometric abduction against PT hand 12x 3 second holds  Isometric hip flexion against PT hand 10x 3 second holds  LAQ 1x10 BLE; patient demonstrates fatigue in RLE towards end of bout (last few repetitions patient demonstrates increased difficulty picking up RLE off ground)   Pt educated throughout session about proper posture and technique with exercises. Improved exercise technique, movement at target joints, use of target muscles after minverbal, visual, tactile cues  Seated rest breaks and use of ice pack utilized to keep body temperature at functional range to  reduce MS exacerbation.                      PT Education - 07/22/18 1047    Education provided  Yes    Education Details  exercise technique, stability, core strengthening, weight shifting during ambulation    Person(s) Educated  Patient    Methods  Explanation;Demonstration;Tactile cues;Verbal cues    Comprehension  Verbalized understanding;Returned demonstration;Verbal cues required;Tactile cues required;Need further instruction       PT Short Term Goals - 07/01/18 1626      PT SHORT TERM GOAL #1   Title  Pt will be completed HEP with min assist from caregiver at least 4 days/wk for improved carryover between sessions    Baseline  HEP compliant     Time  2    Period  Weeks    Status  Achieved      PT SHORT TERM GOAL #2   Title  Patient will report a worst pain of 6/10 on VAS in cervical spine to improve tolerance with ADLs and reduced symptoms with activities.     Baseline  4/30: 9/10 5/21: 0/10 pain    Time  2    Period  Weeks    Status  Achieved      PT SHORT TERM GOAL #3   Title  Patient will perform cervical rotation at least 50 degrees, flexion and extension to 70 degrees, and sidebending to 40 degrees without pain    Baseline  4/30: R 50 L 34 Rotation, R 26 L 35 sb, Extension 35 flex 35 ; 5/21: extension 46, SB R 41 L 40, rotation R 62 L 61,     Time  2    Period  Weeks     Status  Achieved        PT Long Term Goals - 07/01/18 0001      PT LONG TERM GOAL #2   Title  Pt's 35mT will improve to at least 0.5 m/s for improved safety ambulating in home    Baseline  0.14 m/s; 5/15: .25m  7/10: .36 m/s  8/20: .42 m/s  9/26: 0.2677m10/14: .26 m/s improved hip and knee clearance 11/15: 0.81m4mmproved hip and knee clearance; 12/5: .29 m/s improved step length and forward moment: decreased foot drag. 05/09/2018: 0.21 m/s 2/6: .237 m/s    Time  8    Period  Weeks    Status  Partially Met    Target Date  08/26/18      PT LONG TERM GOAL #3   Title  Pt's Bil hip F strength will improve to at least 2+/5 BLEs for improved gait mechanics and safety    Baseline  L: 2/5, R: 1/5 7/10: 2/5 bilaterally  8/20: 2/5 bilat 9/26: 2/5 bilat 10/14: 2/5 bilaterally ,improved hip flexor muscle activation 2/5: improved hip flexor and quad activation: 12/5: 2/5 ; 05/09/2018: 2/5 2/6: 3/5 knee, 2 to 2+/5 hip     Time  8    Period  Weeks    Status  Partially Met    Target Date  08/26/18      PT LONG TERM GOAL #4   Title  Pt's ABC Scale will improve to at least 40% to demonstrate improved balance    Baseline  28.13% 5/15; 24.4% 7/10: 23.4% 8/20: 22. 2% 9/26: 25.3% 10/14: 26.5% 11/15: 24.38% 12/5: 26%; 05/09/2018: 29% 2/6: 23.75%    Time  8    Period  Weeks  Status  Partially Met    Target Date  08/26/18      PT LONG TERM GOAL #6   Title  Pt will be able to perform tub bench into/out of shower transfer without assist for improved independence with this task    Baseline  challenged with steps at this time, getting better, stands and gives sponge bath independently now  9/26: has not tried transfer yet 11/15: has not attempted yet; 05/09/2018: not attempted    Time  8   08/26/18   Period  Weeks    Status  Partially Met    Target Date  08/26/18      PT LONG TERM GOAL  #12   TITLE  Patient will negotiate one step ~4 inches to enter house independently to increase functional independence  in natural environment.     Baseline  8/20: unable to do 9/26: patient too fatigued/not feeling well to perform 10/14: unable to perform at this time 11/15: unable to perform at this time; 12/5: able to lift 1 inch; 05/09/2018: unable to clear 4" step, able to lift foot ~ 1in    Time  8    Period  Weeks    Status  On-going    Target Date  08/26/18      PT LONG TERM GOAL  #13   TITLE  Patient will perform STS from standard height surface to walker to allow patient to participate and transition in public areas when not in wheelchair.     Baseline  10/14: able to perform in W/C which is raised slightly 11/15: 20inch height 12/5: 18 inches from plinth table; 05/09/2018: 18 inches from plinth table2/6: 17 inch plinth table    Time  8    Period  Weeks    Status  Achieved      PT LONG TERM GOAL  #14   TITLE  Patient will stand 5 minutes to increase standing duration to allow patient to return to washing dishes and clothes.     Baseline  12/5: 56 seconds; 05/09/2018: 1 min 18 sec 2/6: 2 minutes last session    Time  8    Period  Weeks    Status  On-going    Target Date  08/26/18      PT LONG TERM GOAL  #15   TITLE  Patient will negotiate 30 ft of changing surfaces outside such as pavement and parking lot to allow patient to walk into church.     Baseline  12/5: does not walk outside 2/24: unable to test due to rain    Time  8    Period  Weeks    Status  On-going    Target Date  08/26/18            Plan - 07/22/18 1150    Clinical Impression Statement  The patient demonstrated improved gait in terms of speed and mechanics today as compared to previous sessions. The patient also required fewer verbal cues for weight shifting, upright posture, and step length during ambulation as compared to previous sessions. The patient tolerated new core/trunk stabilization exercises and BLE strengthening exercises well today. The patient will continue to benefit from skilled therapeutic intervention to  address deficits in strength, mobility, balance, and function for improved overall QOL and safety.      Rehab Potential  Good    PT Frequency  2x / week    PT Duration  8 weeks    PT Treatment/Interventions  ADLs/Self  Care Home Management;Aquatic Therapy;Biofeedback;Cryotherapy;Electrical Stimulation;Iontophoresis 107m/ml Dexamethasone;Traction;Moist Heat;Ultrasound;DME Instruction;Gait training;Stair training;Functional mobility training;Therapeutic activities;Therapeutic exercise;Balance training;Neuromuscular re-education;Patient/family education;Orthotic Fit/Training;Wheelchair mobility training;Manual techniques;Compression bandaging;Passive range of motion;Dry needling;Energy conservation;Taping;Splinting    PT Next Visit Plan  continue strengthening/balance; progress note/reassess goals    PT Home Exercise Plan  to initiate next session (please incorporate hip flexor and adductor stretches in HEP if possible)    Consulted and Agree with Plan of Care  Patient       Patient will benefit from skilled therapeutic intervention in order to improve the following deficits and impairments:  Abnormal gait, Decreased activity tolerance, Decreased balance, Decreased coordination, Decreased endurance, Decreased knowledge of use of DME, Decreased mobility, Decreased range of motion, Decreased safety awareness, Decreased strength, Difficulty walking, Impaired perceived functional ability, Impaired sensation, Impaired tone, Impaired UE functional use, Improper body mechanics, Postural dysfunction  Visit Diagnosis: Muscle weakness (generalized)  Other abnormalities of gait and mobility  Unsteadiness on feet     Problem List Patient Active Problem List   Diagnosis Date Noted  . Abscess of female pelvis   . SVT (supra[place]ricular tachycardia) ([place]   . Radial styloid tenosynovitis 03/12/2018  . Wheelchair confinement 02/27/2018  . Localized osteoporosis with current pathological fracture with routine  healing 01/19/2017  . Wrist fracture 01/16/2017  . Sprain of ankle 03/23/2016  . Closed fracture of lateral malleolus 03/16/2016  . Health care maintenance 01/24/2016  . Blood pressure elevated without history of HTN 10/25/2015  . Essential hypertension 10/25/2015  . Multiple sclerosis ([place] 10/02/2015  . Chronic left shoulder pain 07/19/2015  . Multiple sclerosis exacerbation ([place] 07/14/2015  . MS (multiple sclerosis) ([place] 11/26/2014  . Increased body mass index 11/26/2014  . HPV test positive 11/26/2014  . Status post laparoscopic supracervical hysterectomy 11/26/2014  . Galactorrhea 11/26/2014  . Back ache 05/21/2014  . Adiposity 05/21/2014  . Disordered sleep 05/21/2014  . Muscle spasticity 05/21/2014  . Spasticity 05/21/2014  . Calculus of kidney 12/09/2013  . Renal colic 010/25/8527 . Hypercholesteremia 08/19/2013  . Hereditary and idiopathic neuropathy 08/19/2013  . Hypercholesterolemia without hypertriglyceridemia 08/19/2013  . Bladder infection, chronic 07/25/2012  . Disorder of bladder function 07/25/2012  . Incomplete bladder emptying 07/25/2012  . Microscopic hematuria 07/25/2012  . Right upper quadrant pain 07/25/2012   ZOrlean Patten SPT  This entire session was performed under direct supervision and direction of a licensed therapist/therapist assistant . I have personally read, edited and approve of the note as written.  MJanna Arch PT, DPT   07/22/2018, 12:56 PM  [place] [facility] [address] [address]Phone: [phone]  Fax:  [phone] Name: Lisa Williams MRN: [id number] of Birth: 608/09/1960

## 2018-07-23 ENCOUNTER — Ambulatory Visit: Payer: Medicare HMO

## 2018-07-24 ENCOUNTER — Ambulatory Visit: Payer: Medicare HMO

## 2018-07-25 ENCOUNTER — Ambulatory Visit: Payer: Medicare HMO

## 2018-07-25 ENCOUNTER — Ambulatory Visit: Payer: Medicare HMO | Admitting: Physical Therapy

## 2018-07-25 ENCOUNTER — Other Ambulatory Visit: Payer: Self-pay

## 2018-07-25 DIAGNOSIS — R2681 Unsteadiness on feet: Secondary | ICD-10-CM

## 2018-07-25 DIAGNOSIS — M6281 Muscle weakness (generalized): Secondary | ICD-10-CM

## 2018-07-25 DIAGNOSIS — R2689 Other abnormalities of gait and mobility: Secondary | ICD-10-CM

## 2018-07-25 NOTE — Therapy (Signed)
[place] [facility] MAIN [facility] SERVICES [address] [place], [place], [address] Phone: [phone]   Fax:  [phone]  Patient Details  Name: Lisa Williams MRN: [id number] Date of Birth: 1961-03-15 Referring Provider:  Morene Crocker, MD  Encounter Date: 07/25/2018  Patient requests to hold physical therapy due to COVID-19 due to her compromised immune system. Discussed HEP compliance to maintain current level of function.   Precious Bard, PT, DPT   07/25/2018, 9:43 AM  [place] [facility] MAIN [facility] SERVICES [address] [place], [place], [address] Phone: [phone]   Fax:  [phone]

## 2018-07-25 NOTE — Therapy (Deleted)
[place] MAIN [facility] SERVICES [place], [place], [address] Phone: [phone]   Fax:  [phone]  Physical Therapy Treatment Physical Therapy Progress Note   Dates of reporting period  ***   to   ***  Patient Details  Name: Lisa Williams MRN: [id number] Date of Birth: Aug 29, 1960 Referring Provider (PT): Gurney Maxin, MD   Encounter Date: 07/25/2018    Past Medical History:  Diagnosis Date  . Abdominal pain, right upper quadrant   . Back pain   . Calculus of kidney 12/09/2013  . Chronic back pain    unspecified  . Chronic left shoulder pain 07/19/2015  . Functional disorder of bladder    other  . Galactorrhea 11/26/2014   Chronic   . Hereditary and idiopathic neuropathy 08/19/2013  . HPV test positive   . Hypercholesteremia 08/19/2013  . Incomplete bladder emptying   . Microscopic hematuria   . MS (multiple sclerosis) ([place])   . Muscle spasticity 05/21/2014  . Nonspecific findings on examination of urine    other  . Osteopenia   . Status post laparoscopic supracervical hysterectomy 11/26/2014  . Tobacco user 11/26/2014  . Wrist fracture     Past Surgical History:  Procedure Laterality Date  . bilateral tubal ligation  1996  . BREAST CYST EXCISION Left 2002  . KNEE SURGERY     right  . LAPAROSCOPIC SUPRACERVICAL HYSTERECTOMY  08/05/2013  . ORIF WRIST FRACTURE Left 01/17/2017   Procedure: OPEN REDUCTION INTERNAL FIXATION (ORIF) WRIST FRACTURE;  Surgeon: Lovell Sheehan, MD;  Location: [facility] ORS;  Service: Orthopedics;  Laterality: Left;  . TUBAL LIGATION Bilateral   . VAGINAL HYSTERECTOMY  03/2006    There were no vitals filed for this visit.      10 MWT Bil hip flexion  ABC Step negotiation  Ambulate outside?        10 MWT Bil hip flexion  ABC Step negotiation  Ambulate outside?                  PT Short Term Goals - 07/01/18 1626      PT SHORT TERM GOAL #1   Title  Pt  will be completed HEP with min assist from caregiver at least 4 days/wk for improved carryover between sessions    Baseline  HEP compliant     Time  2    Period  Weeks    Status  Achieved      PT SHORT TERM GOAL #2   Title  Patient will report a worst pain of 6/10 on VAS in cervical spine to improve tolerance with ADLs and reduced symptoms with activities.     Baseline  4/30: 9/10 5/21: 0/10 pain    Time  2    Period  Weeks    Status  Achieved      PT SHORT TERM GOAL #3   Title  Patient will perform cervical rotation at least 50 degrees, flexion and extension to 70 degrees, and sidebending to 40 degrees without pain    Baseline  4/30: R 50 L 34 Rotation, R 26 L 35 sb, Extension 35 flex 35 ; 5/21: extension 46, SB R 41 L 40, rotation R 62 L 61,     Time  2    Period  Weeks    Status  Achieved        PT Long Term Goals - 07/01/18 0001      PT LONG TERM GOAL #  2   Title  Pt's 63mT will improve to at least 0.5 m/s for improved safety ambulating in home    Baseline  0.14 m/s; 5/15: .249m  7/10: .36 m/s  8/20: .42 m/s  9/26: 0.2653m10/14: .26 m/s improved hip and knee clearance 11/15: 0.18m46mmproved hip and knee clearance; 12/5: .29 m/s improved step length and forward moment: decreased foot drag. 05/09/2018: 0.21 m/s 2/6: .237 m/s    Time  8    Period  Weeks    Status  Partially Met    Target Date  08/26/18      PT LONG TERM GOAL #3   Title  Pt's Bil hip F strength will improve to at least 2+/5 BLEs for improved gait mechanics and safety    Baseline  L: 2/5, R: 1/5 7/10: 2/5 bilaterally  8/20: 2/5 bilat 9/26: 2/5 bilat 10/14: 2/5 bilaterally ,improved hip flexor muscle activation 2/5: improved hip flexor and quad activation: 12/5: 2/5 ; 05/09/2018: 2/5 2/6: 3/5 knee, 2 to 2+/5 hip     Time  8    Period  Weeks    Status  Partially Met    Target Date  08/26/18      PT LONG TERM GOAL #4   Title  Pt's ABC Scale will improve to at least 40% to demonstrate improved balance    Baseline   28.13% 5/15; 24.4% 7/10: 23.4% 8/20: 22. 2% 9/26: 25.3% 10/14: 26.5% 11/15: 24.38% 12/5: 26%; 05/09/2018: 29% 2/6: 23.75%    Time  8    Period  Weeks    Status  Partially Met    Target Date  08/26/18      PT LONG TERM GOAL #6   Title  Pt will be able to perform tub bench into/out of shower transfer without assist for improved independence with this task    Baseline  challenged with steps at this time, getting better, stands and gives sponge bath independently now  9/26: has not tried transfer yet 11/15: has not attempted yet; 05/09/2018: not attempted    Time  8   08/26/18   Period  Weeks    Status  Partially Met    Target Date  08/26/18      PT LONG TERM GOAL  #12   TITLE  Patient will negotiate one step ~4 inches to enter house independently to increase functional independence in natural environment.     Baseline  8/20: unable to do 9/26: patient too fatigued/not feeling well to perform 10/14: unable to perform at this time 11/15: unable to perform at this time; 12/5: able to lift 1 inch; 05/09/2018: unable to clear 4" step, able to lift foot ~ 1in    Time  8    Period  Weeks    Status  On-going    Target Date  08/26/18      PT LONG TERM GOAL  #13   TITLE  Patient will perform STS from standard height surface to walker to allow patient to participate and transition in public areas when not in wheelchair.     Baseline  10/14: able to perform in W/C which is raised slightly 11/15: 20inch height 12/5: 18 inches from plinth table; 05/09/2018: 18 inches from plinth table2/6: 17 inch plinth table    Time  8    Period  Weeks    Status  Achieved      PT LONG TERM GOAL  #14   TITLE  Patient will stand 5 minutes  to increase standing duration to allow patient to return to washing dishes and clothes.     Baseline  12/5: 56 seconds; 05/09/2018: 1 min 18 sec 2/6: 2 minutes last session    Time  8    Period  Weeks    Status  On-going    Target Date  08/26/18      PT LONG TERM GOAL  #15   TITLE   Patient will negotiate 30 ft of changing surfaces outside such as pavement and parking lot to allow patient to walk into church.     Baseline  12/5: does not walk outside 2/24: unable to test due to rain    Time  8    Period  Weeks    Status  On-going    Target Date  08/26/18              Patient will benefit from skilled therapeutic intervention in order to improve the following deficits and impairments:     Visit Diagnosis: No diagnosis found.     Problem List Patient Active Problem List   Diagnosis Date Noted  . Abscess of female pelvis   . SVT (supraventricular tachycardia) ([place])   . Radial styloid tenosynovitis 03/12/2018  . Wheelchair confinement 02/27/2018  . Localized osteoporosis with current pathological fracture with routine healing 01/19/2017  . Wrist fracture 01/16/2017  . Sprain of ankle 03/23/2016  . Closed fracture of lateral malleolus 03/16/2016  . Health care maintenance 01/24/2016  . Blood pressure elevated without history of HTN 10/25/2015  . Essential hypertension 10/25/2015  . Multiple sclerosis ([place]) 10/02/2015  . Chronic left shoulder pain 07/19/2015  . Multiple sclerosis exacerbation ([place]) 07/14/2015  . MS (multiple sclerosis) ([place]) 11/26/2014  . Increased body mass index 11/26/2014  . HPV test positive 11/26/2014  . Status post laparoscopic supracervical hysterectomy 11/26/2014  . Galactorrhea 11/26/2014  . Back ache 05/21/2014  . Adiposity 05/21/2014  . Disordered sleep 05/21/2014  . Muscle spasticity 05/21/2014  . Spasticity 05/21/2014  . Calculus of kidney 12/09/2013  . Renal colic 95/62/1308  . Hypercholesteremia 08/19/2013  . Hereditary and idiopathic neuropathy 08/19/2013  . Hypercholesterolemia without hypertriglyceridemia 08/19/2013  . Bladder infection, chronic 07/25/2012  . Disorder of bladder function 07/25/2012  . Incomplete bladder emptying 07/25/2012  . Microscopic hematuria 07/25/2012  . Right upper quadrant pain  07/25/2012    Janna Arch 07/25/2018, 9:28 AM  [place] MAIN [facility] SERVICES [address] [place], [place], [address] Phone: [phone]   Fax:  [phone]  Name: Lisa Williams MRN: [id number] Date of Birth: 1960/11/09

## 2018-07-29 ENCOUNTER — Ambulatory Visit: Payer: Medicare HMO

## 2018-07-30 ENCOUNTER — Ambulatory Visit: Payer: Medicare HMO

## 2018-07-30 NOTE — Therapy (Signed)
[facility] MAIN [facility] SERVICES [address] [place], [place], [address] Phone: [phone]   Fax:  [phone]  Patient Details  Name: Mercie Poertner Capp MRN: [id number] Date of Birth: [birth year] Referring Provider:  No ref. provider found  Encounter Date: 07/30/2018  Patient called by PT to ensure patient is doing well, does not have questions, and review HEP. Called due to current outpatient closure for COVID- 19. Patient did not pick up PT call. PT left voicemail stating reason for call and number for call back for further questions/concerns.   Precious Bard, PT, DPT   07/30/2018, 8:50 AM  [facility] MAIN [facility] SERVICES [address] [place], [place], [address] Phone: [phone]   Fax:  [phone]

## 2018-07-31 ENCOUNTER — Ambulatory Visit: Payer: Medicare HMO

## 2018-08-01 ENCOUNTER — Ambulatory Visit: Payer: Medicare HMO

## 2018-08-05 ENCOUNTER — Ambulatory Visit: Payer: Medicare HMO

## 2018-08-06 ENCOUNTER — Ambulatory Visit: Payer: Medicare HMO

## 2018-08-07 ENCOUNTER — Ambulatory Visit: Payer: Medicare HMO

## 2018-08-08 ENCOUNTER — Ambulatory Visit: Payer: Medicare HMO

## 2018-08-13 ENCOUNTER — Ambulatory Visit: Payer: Medicare HMO

## 2018-08-15 NOTE — Therapy (Signed)
[facility] MAIN [facility] SERVICES [address] [place], [place], [address] Phone: [phone]   Fax:  [phone]  Patient Details  Name: Lisa Williams MRN: [id number] Date of Birth: 07/13/1960 Referring Provider:  No ref. provider found  Encounter Date: 08/15/2018  The [facility] outpatient clinics are closed at this time due to the COVID-19 epidemic. Upon consideration of patient's status and needs, the referring physician is being contacted to request a home health referral in order to help provide continued rehabilitation services care for the patient. Patient agreeable to change in POC.    Precious Bard, PT, DPT   08/15/2018, 3:21 PM   [facility] MAIN [facility] SERVICES [address] [place], [place], [address] Phone: [phone]   Fax:  [phone]

## 2018-08-16 ENCOUNTER — Ambulatory Visit: Payer: Medicare HMO

## 2018-08-19 ENCOUNTER — Ambulatory Visit: Payer: Medicare HMO

## 2018-08-23 ENCOUNTER — Ambulatory Visit: Payer: Medicare HMO

## 2018-08-27 ENCOUNTER — Ambulatory Visit: Payer: Medicare HMO

## 2018-08-29 ENCOUNTER — Ambulatory Visit: Payer: Medicare HMO

## 2018-09-03 ENCOUNTER — Ambulatory Visit: Payer: Medicare HMO

## 2018-09-05 ENCOUNTER — Ambulatory Visit: Payer: Medicare HMO

## 2018-10-01 ENCOUNTER — Ambulatory Visit: Payer: Medicare HMO | Attending: Neurology

## 2018-10-01 ENCOUNTER — Other Ambulatory Visit: Payer: Self-pay

## 2018-10-01 DIAGNOSIS — M6281 Muscle weakness (generalized): Secondary | ICD-10-CM | POA: Diagnosis present

## 2018-10-01 DIAGNOSIS — R2681 Unsteadiness on feet: Secondary | ICD-10-CM | POA: Diagnosis present

## 2018-10-01 DIAGNOSIS — R2689 Other abnormalities of gait and mobility: Secondary | ICD-10-CM | POA: Diagnosis present

## 2018-10-01 DIAGNOSIS — M5412 Radiculopathy, cervical region: Secondary | ICD-10-CM | POA: Insufficient documentation

## 2018-10-01 NOTE — Therapy (Signed)
[facility] SERVICES [place], [place], [address] Phone: [phone]   Fax:  [phone]  Physical Therapy Treatment/ RECERT/ Progress Note Physical Therapy Progress Note   Dates of reporting period  06/13/18  to  10/01/18   Patient Details  Name: Lisa Williams MRN: [id number] Date of Birth: 1961-01-19 Referring Provider (PT): Lisa Maxin, MD   Encounter Date: 10/01/2018  PT End of Session - 10/01/18 1002    Visit Number  27    Number of Visits  27    Date for PT Re-Evaluation  11/26/18    Authorization Type  10/10 PN start 06/13/18: next 1/10 starting 10/01/18    PT Start Time  0903    PT Stop Time  0949    PT Time Calculation (min)  46 min    Equipment Utilized During Treatment  Gait belt    Activity Tolerance  Patient tolerated treatment well;Other (comment)   increased fatigue from breathing with mask   Behavior During Therapy  [facility] for tasks assessed/performed       Past Medical History:  Diagnosis Date  . Abdominal pain, right upper quadrant   . Back pain   . Calculus of kidney 12/09/2013  . Chronic back pain    unspecified  . Chronic left shoulder pain 07/19/2015  . Functional disorder of bladder    other  . Galactorrhea 11/26/2014   Chronic   . Hereditary and idiopathic neuropathy 08/19/2013  . HPV test positive   . Hypercholesteremia 08/19/2013  . Incomplete bladder emptying   . Microscopic hematuria   . MS (multiple sclerosis) ([place])   . Muscle spasticity 05/21/2014  . Nonspecific findings on examination of urine    other  . Osteopenia   . Status post laparoscopic supracervical hysterectomy 11/26/2014  . Tobacco user 11/26/2014  . Wrist fracture     Past Surgical History:  Procedure Laterality Date  . bilateral tubal ligation  1996  . BREAST CYST EXCISION Left 2002  . KNEE SURGERY     right  . LAPAROSCOPIC SUPRACERVICAL HYSTERECTOMY  08/05/2013  . ORIF WRIST FRACTURE Left 01/17/2017    Procedure: OPEN REDUCTION INTERNAL FIXATION (ORIF) WRIST FRACTURE;  Surgeon: Lovell Sheehan, MD;  Location: [facility] ORS;  Service: Orthopedics;  Laterality: Left;  . TUBAL LIGATION Bilateral   . VAGINAL HYSTERECTOMY  03/2006    There were no vitals filed for this visit.  Subjective Assessment - 10/01/18 0942    Subjective  Patient returning to outpatient facility now that facility has re-opened from [place] 19. Patient is aware of protocol and wearing mask.  Has been performing HEP and interventions at home to retain strength reports no falls since seen last.     Pertinent History  Pt is a 58 y/o F who presents with BLE weakness and difficulty ambulating due to h/o MS that was diagnosed in 1995. Pt denies any pain associated. Pt was admitted to the hospital in November 2018 due to MS exacerbation. Nov-Dec to HHPT-->[facility] until end of Jan. After rehab the pt was able to ambulate up to 100 ft with the RW. Pt has Bil AFO, her therapist at [facility] thinks she needs a new AFO as her feet still drag when she ambulates, pt reports HHPT mentioned a spring AFO. Pt has custom AFOs that were originally made 4 years ago. Goals: improve balance, to be able to get into the shower with her tub bench without assist, to be able  to get up 2 steps at sister's home with railings on both sides but too far apart to reach both sides, to be able to drive adapted car. Pt is staying at other sister's house with one step to get inside and currently picking up L leg with UEs and then stepping in. Pt steps down leading with LLE. Pt wants to work on walking side to side as this is challenging and she had not yet progressed to this at rehab. Pt able to get in/out the car with assist only to bring BLEs into the car. Hoping to drive by June or July with adapted car (already has one). Pt needs assist bringing LEs into and out of hospital bed. Pt able to perform sit>stand without assist from [facility] with a pillow on the seat for a boost. Pt  denies any falls in the past 6 months. Pt has been increasing her time alone at home up to a few hours at a time to become more independent. Pt has both manual and power WC. Uses power WC at home, uses manual WC out in community. Pt able to self propel manual WC. Pt needs assist putting pants around ankles but she can pull them up. Pt can put on her shoes but needs help getting her leg up to do so. Pt able to tie her shoelaces on her own. Pt has a tub/shower unit at home with a tub bench. No grab bars in the shower. Has hand held shower head. Pt currently taking a sponge bath. Pt has a regular height toilet with a BSC over top. Pt with h/o L wrist fx with no precautions, pt wore her soft brace to evaluation for comfort but reports her wrist has healed and is doing well following OT.     Limitations  Lifting;Standing;Walking;House hold activities    How long can you sit comfortably?  n/a    How long can you stand comfortably?  3 minutes without UE assist, limited by fatigue    How long can you walk comfortably?  100 ft, limited by fatigue    Patient Stated Goals  see above    Currently in Pain?  No/denies       Goals :   10 MWT: 44  Seconds =.23 m/s Hip strength: 2-/5 knees 3/5  ABC: 30% Tub bench shower : did one time with OT but needs a new one Negotiate one step: unable  5 minute stand: 1 minute 36 seconds; limited by breathing from mask.  Negotiate 30 ft of outside walking: not been able to go outside due to COVID Add TUG: 61 seconds   Treatment Gr 2000 weighted ball  15x straight arm raise,upright posture no back support for trunk and UE strengthening  10x arm twists straight arm, upright posture no back support for trunk and Ue support  15x  Weighted ball throws  Patient requires use of ice pack between interventions and extra rest break to return body temperature for functional range due to diagnosis of MS for optimal muscle performance.  Patient returns to outpatient physical  therapy s/p closing for COVID 19. Patient has been compliant with HEP and has similar velocity of ambulation as when seen last with slight decrease in 10 MWT noted, ABC score 30%, and TUG performed with 61 seconds. Patient fatigues quickly and has difficulty breathing due to wearing mask resulting in increased out of breath rate.  Patient's condition has the potential to improve in response to therapy. Maximum improvement is yet  to be obtained. The anticipated improvement is attainable and reasonable in a generally predictable time.  Patient reports she continues to be challenged with ambulation capacity as well as prolonged standing. The patient will continue to benefit from skilled therapeutic intervention to address deficits in strength, mobility, balance, and function for improved overall QOL and safety.                      PT Education - 10/01/18 1001    Education provided  Yes    Education Details  goal re-assessment, exercise technique, stability    Person(s) Educated  Patient    Methods  Explanation;Demonstration;Tactile cues;Verbal cues    Comprehension  Verbalized understanding;Returned demonstration;Verbal cues required;Tactile cues required       PT Short Term Goals - 10/01/18 1006      PT SHORT TERM GOAL #1   Title  Patient will be compliant with HEP 4-5 days/week for improved carryover between sessions.     Baseline  HEP compliancy between sessions    Time  2    Period  Weeks    Status  New    Target Date  10/15/18      PT SHORT TERM GOAL #2   Title  Patient will report no falls/LOB to indicate improved stability with mobility     Baseline  new    Time  2    Period  Weeks    Status  New    Target Date  10/15/18        PT Long Term Goals - 10/01/18 0001      PT LONG TERM GOAL #1   Title  Patient will perform TUG in 40 seconds or less with walker to improve safe negotiation of environment.     Baseline  5/26: 61 seconds    Time  8    Period  Weeks     Status  New    Target Date  11/26/18      PT LONG TERM GOAL #2   Title  Pt's 79mT will improve to at least 0.5 m/s for improved safety ambulating in home    Baseline  0.14 m/s; 5/15: .241m  7/10: .36 m/s  8/20: .42 m/s  9/26: 0.2685m10/14: .26 m/s improved hip and knee clearance 11/15: 0.4m58mmproved hip and knee clearance; 12/5: .29 m/s improved step length and forward moment: decreased foot drag. 05/09/2018: 0.21 m/s 2/6: .237 m/s 5/26: .23 m/s    Time  8    Period  Weeks    Status  Partially Met    Target Date  11/26/18      PT LONG TERM GOAL #3   Title  Pt's Bil hip F strength will improve to at least 2+/5 BLEs for improved gait mechanics and safety    Baseline  L: 2/5, R: 1/5 7/10: 2/5 bilaterally  8/20: 2/5 bilat 9/26: 2/5 bilat 10/14: 2/5 bilaterally ,improved hip flexor muscle activation 2/5: improved hip flexor and quad activation: 12/5: 2/5 ; 05/09/2018: 2/5 2/6: 3/5 knee, 2 to 2+/5 hip 5/26: 2-/5 hips 3/5 knees    Time  8    Period  Weeks    Status  Partially Met    Target Date  11/26/18      PT LONG TERM GOAL #4   Title  Pt's ABC Scale will improve to at least 40% to demonstrate improved balance    Baseline  28.13% 5/15; 24.4% 7/10: 23.4% 8/20: 22. 2% 9/26:  25.3% 10/14: 26.5% 11/15: 24.38% 12/5: 26%; 05/09/2018: 29% 2/6: 23.75% 526: 30%    Time  8    Period  Weeks    Status  Partially Met    Target Date  11/26/18      PT LONG TERM GOAL #5   Title  Patient will negotiate one step ~4 inches to enter house independently to increase functional independence in natural environment    Baseline  /20: unable to do 9/26: patient too fatigued/not feeling well to perform 10/14: unable to perform at this time 11/15: unable to perform at this time; 12/5: able to lift 1 inch; 05/09/2018: unable to clear 4" step, able to lift foot ~ 1in 5/26: has been unable to perform    Time  8    Period  Weeks    Status  On-going    Target Date  11/26/18      PT LONG TERM GOAL #6   Title  Pt will  be able to perform tub bench into/out of shower transfer without assist for improved independence with this task    Baseline  challenged with steps at this time, getting better, stands and gives sponge bath independently now  9/26: has not tried transfer yet 11/15: has not attempted yet; 05/09/2018: not attempted 5/26: able to do one time but needs a new shower chair    Time  8    Period  Weeks    Status  Partially Met    Target Date  11/26/18      PT LONG TERM GOAL #7   Title  Patient will stand 5 minutes to increase standing duration to allow patient to return to washing dishes and clothes.     Baseline  12/5: 56 seconds; 05/09/2018: 1 min 18 sec 2/6: 2 minutes last session 5/26: 1 minute 36 seconds limited by breathing    Time  8    Period  Weeks    Status  Partially Met    Target Date  11/26/18      PT LONG TERM GOAL #8   Title  Patient will negotiate 30 ft of changing surfaces outside such as pavement and parking lot to allow patient to walk into church.    Baseline  12/5: does not walk outside 2/24: unable to test due to rain 5/26: unable since COVID    Time  8    Period  Weeks    Status  On-going    Target Date  11/26/18            Plan - 10/01/18 1006    Clinical Impression Statement  Patient returns to outpatient physical therapy s/p closing for COVID 19. Patient has been compliant with HEP and has similar velocity of ambulation as when seen last with slight decrease in 10 MWT noted, ABC score 30%, and TUG performed with 61 seconds. Patient fatigues quickly and has difficulty breathing due to wearing mask resulting in increased out of breath rate.  Patient's condition has the potential to improve in response to therapy. Maximum improvement is yet to be obtained. The anticipated improvement is attainable and reasonable in a generally predictable time.  Patient reports she continues to be challenged with ambulation capacity as well as prolonged standing. The patient will continue to  benefit from skilled therapeutic intervention to address deficits in strength, mobility, balance, and function for improved overall QOL and safety.     Rehab Potential  Good    PT Frequency  2x / week  PT Duration  8 weeks    PT Treatment/Interventions  ADLs/Self Care Home Management;Aquatic Therapy;Biofeedback;Cryotherapy;Electrical Stimulation;Iontophoresis 33m/ml Dexamethasone;Traction;Moist Heat;Ultrasound;DME Instruction;Gait training;Stair training;Functional mobility training;Therapeutic activities;Therapeutic exercise;Balance training;Neuromuscular re-education;Patient/family education;Orthotic Fit/Training;Wheelchair mobility training;Manual techniques;Compression bandaging;Passive range of motion;Dry needling;Energy conservation;Taping;Splinting    PT Next Visit Plan  strength/balance    PT Home Exercise Plan  to initiate next session (please incorporate hip flexor and adductor stretches in HEP if possible)    Consulted and Agree with Plan of Care  Patient       Patient will benefit from skilled therapeutic intervention in order to improve the following deficits and impairments:  Abnormal gait, Decreased activity tolerance, Decreased balance, Decreased coordination, Decreased endurance, Decreased knowledge of use of DME, Decreased mobility, Decreased range of motion, Decreased safety awareness, Decreased strength, Difficulty walking, Impaired perceived functional ability, Impaired sensation, Impaired tone, Impaired UE functional use, Improper body mechanics, Postural dysfunction  Visit Diagnosis: Muscle weakness (generalized)  Other abnormalities of gait and mobility  Unsteadiness on feet     Problem List Patient Active Problem List   Diagnosis Date Noted  . Abscess of female pelvis   . SVT (supraventricular tachycardia) ([place]   . Radial styloid tenosynovitis 03/12/2018  . Wheelchair confinement 02/27/2018  . Localized osteoporosis with current pathological fracture with  routine healing 01/19/2017  . Wrist fracture 01/16/2017  . Sprain of ankle 03/23/2016  . Closed fracture of lateral malleolus 03/16/2016  . Health care maintenance 01/24/2016  . Blood pressure elevated without history of HTN 10/25/2015  . Essential hypertension 10/25/2015  . Multiple sclerosis ([place] 10/02/2015  . Chronic left shoulder pain 07/19/2015  . Multiple sclerosis exacerbation ([place] 07/14/2015  . MS (multiple sclerosis) ([place] 11/26/2014  . Increased body mass index 11/26/2014  . HPV test positive 11/26/2014  . Status post laparoscopic supracervical hysterectomy 11/26/2014  . Galactorrhea 11/26/2014  . Back ache 05/21/2014  . Adiposity 05/21/2014  . Disordered sleep 05/21/2014  . Muscle spasticity 05/21/2014  . Spasticity 05/21/2014  . Calculus of kidney 12/09/2013  . Renal colic 007/21/8288 . Hypercholesteremia 08/19/2013  . Hereditary and idiopathic neuropathy 08/19/2013  . Hypercholesterolemia without hypertriglyceridemia 08/19/2013  . Bladder infection, chronic 07/25/2012  . Disorder of bladder function 07/25/2012  . Incomplete bladder emptying 07/25/2012  . Microscopic hematuria 07/25/2012  . Right upper quadrant pain 07/25/2012   MJanna Arch PT, DPT   10/01/2018, 11:31 AM  [facility]ICES [address]RToul[address] [place] [address]Phone: [phone]  Fax:  [phone] Name: JCalisha TindelRamseur MRN: [id number] of Birth: [birth year]

## 2018-10-04 ENCOUNTER — Other Ambulatory Visit: Payer: Self-pay

## 2018-10-04 ENCOUNTER — Ambulatory Visit: Payer: Medicare HMO

## 2018-10-04 DIAGNOSIS — M6281 Muscle weakness (generalized): Secondary | ICD-10-CM | POA: Diagnosis not present

## 2018-10-04 DIAGNOSIS — R2681 Unsteadiness on feet: Secondary | ICD-10-CM

## 2018-10-04 DIAGNOSIS — M5412 Radiculopathy, cervical region: Secondary | ICD-10-CM

## 2018-10-04 DIAGNOSIS — R2689 Other abnormalities of gait and mobility: Secondary | ICD-10-CM

## 2018-10-04 NOTE — Therapy (Signed)
[place] MAIN [facility] SERVICES [address] [place], [place], [address] Phone: [phone]   Fax:  [phone]  Physical Therapy Treatment  Patient Details  Name: Lisa Williams MRN: [id number] Date of Birth: Mar 10, 1961 Referring Provider (PT): Gurney Maxin, MD   Encounter Date: 10/04/2018  PT End of Session - 10/04/18 1002    Visit Number  64    Number of Visits  99    Date for PT Re-Evaluation  11/26/18    Authorization Type  2/10 (1/10 starting 5/26)     PT Start Time  0915    PT Stop Time  0955    PT Time Calculation (min)  40 min    Equipment Utilized During Treatment  Gait belt    Activity Tolerance  Patient tolerated treatment well;Other (comment)   mask is limiting   Behavior During Therapy  WFL for tasks assessed/performed       Past Medical History:  Diagnosis Date  . Abdominal pain, right upper quadrant   . Back pain   . Calculus of kidney 12/09/2013  . Chronic back pain    unspecified  . Chronic left shoulder pain 07/19/2015  . Functional disorder of bladder    other  . Galactorrhea 11/26/2014   Chronic   . Hereditary and idiopathic neuropathy 08/19/2013  . HPV test positive   . Hypercholesteremia 08/19/2013  . Incomplete bladder emptying   . Microscopic hematuria   . MS (multiple sclerosis) ([place])   . Muscle spasticity 05/21/2014  . Nonspecific findings on examination of urine    other  . Osteopenia   . Status post laparoscopic supracervical hysterectomy 11/26/2014  . Tobacco user 11/26/2014  . Wrist fracture     Past Surgical History:  Procedure Laterality Date  . bilateral tubal ligation  1996  . BREAST CYST EXCISION Left 2002  . KNEE SURGERY     right  . LAPAROSCOPIC SUPRACERVICAL HYSTERECTOMY  08/05/2013  . ORIF WRIST FRACTURE Left 01/17/2017   Procedure: OPEN REDUCTION INTERNAL FIXATION (ORIF) WRIST FRACTURE;  Surgeon: Lovell Sheehan, MD;  Location: [facility] ORS;  Service: Orthopedics;  Laterality:  Left;  . TUBAL LIGATION Bilateral   . VAGINAL HYSTERECTOMY  03/2006    There were no vitals filed for this visit.  Subjective Assessment - 10/04/18 0918    Subjective  Pt reports she is doing well this date. She denies any significant updates with meds. HEP is going well in general.     Patient is accompained by:  Family member    Pertinent History  Pt is a 58 y/o F who presents with BLE weakness and difficulty ambulating due to h/o MS that was diagnosed in 1995. Pt denies any pain associated. Pt was admitted to the hospital in November 2018 due to MS exacerbation. Nov-Dec to HHPT-->[facility] until end of Jan. After rehab the pt was able to ambulate up to 100 ft with the RW. Pt has Bil AFO, her therapist at [place] thinks she needs a new AFO as her feet still drag when she ambulates, pt reports HHPT mentioned a spring AFO. Pt has custom AFOs that were originally made 4 years ago. Goals: improve balance, to be able to get into the shower with her tub bench without assist, to be able to get up 2 steps at sister's home with railings on both sides but too far apart to reach both sides, to be able to drive adapted car. Pt is staying at other sister's  house with one step to get inside and currently picking up L leg with UEs and then stepping in. Pt steps down leading with LLE. Pt wants to work on walking side to side as this is challenging and she had not yet progressed to this at rehab. Pt able to get in/out the car with assist only to bring BLEs into the car. Hoping to drive by June or July with adapted car (already has one). Pt needs assist bringing LEs into and out of hospital bed. Pt able to perform sit>stand without assist from [facility] with a pillow on the seat for a boost. Pt denies any falls in the past 6 months. Pt has been increasing her time alone at home up to a few hours at a time to become more independent. Pt has both manual and power WC. Uses power WC at home, uses manual WC out in community.  Pt able to self propel manual WC. Pt needs assist putting pants around ankles but she can pull them up. Pt can put on her shoes but needs help getting her leg up to do so. Pt able to tie her shoelaces on her own. Pt has a tub/shower unit at home with a tub bench. No grab bars in the shower. Has hand held shower head. Pt currently taking a sponge bath. Pt has a regular height toilet with a BSC over top. Pt with h/o L wrist fx with no precautions, pt wore her soft brace to evaluation for comfort but reports her wrist has healed and is doing well following OT.     Limitations  Lifting;Standing;Walking;House hold activities    Currently in Pain?  No/denies      Intervention this date:  -AMB: 1x37f at beginning of session, 1x274fc RW at end of session[facility]. -Sustained Standing in // bars:  1x3 minutes, 1x2.5 minutes, iced rest break between to keep core temperature in limits -Forward foot taps, stepping over yardstick as obstacle: BUE use in // bars: 1x5 bilat rest between. -AMB in // bars to facilitate stepping activity: [id number] from [place] -STS from WC [facility]// bars 3x   PT Short Term Goals - 10/01/18 1006      PT SHORT TERM GOAL #1   Title  Patient will be compliant with HEP 4-5 days/week for improved carryover between sessions.     Baseline  HEP compliancy between sessions    Time  2    Period  Weeks    Status  New    Target Date  10/15/18      PT SHORT TERM GOAL #2   Title  Patient will report no falls/LOB to indicate improved stability with mobility     Baseline  new    Time  2    Period  Weeks    Status  New    Target Date  10/15/18        PT Long Term Goals - 10/01/18 0001      PT LONG TERM GOAL #1   Title  Patient will perform TUG in 40 seconds or less with walker to improve safe negotiation of environment.     Baseline  5/26: 61 seconds    Time  8    Period  Weeks    Status  New    Target Date  11/26/18      PT LONG TERM GOAL #2    Title  Pt's 96m13mill improve  to at least 0.5 m/s for improved safety ambulating in home    Baseline  0.14 m/s; 5/15: .55ms  7/10: .36 m/s  8/20: .42 m/s  9/26: 0.264m 10/14: .26 m/s improved hip and knee clearance 11/15: 0.2892mimproved hip and knee clearance; 12/5: .29 m/s improved step length and forward moment: decreased foot drag. 05/09/2018: 0.21 m/s 2/6: .237 m/s 5/26: .23 m/s    Time  8    Period  Weeks    Status  Partially Met    Target Date  11/26/18      PT LONG TERM GOAL #3   Title  Pt's Bil hip F strength will improve to at least 2+/5 BLEs for improved gait mechanics and safety    Baseline  L: 2/5, R: 1/5 7/10: 2/5 bilaterally  8/20: 2/5 bilat 9/26: 2/5 bilat 10/14: 2/5 bilaterally ,improved hip flexor muscle activation 2/5: improved hip flexor and quad activation: 12/5: 2/5 ; 05/09/2018: 2/5 2/6: 3/5 knee, 2 to 2+/5 hip 5/26: 2-/5 hips 3/5 knees    Time  8    Period  Weeks    Status  Partially Met    Target Date  11/26/18      PT LONG TERM GOAL #4   Title  Pt's ABC Scale will improve to at least 40% to demonstrate improved balance    Baseline  28.13% 5/15; 24.4% 7/10: 23.4% 8/20: 22. 2% 9/26: 25.3% 10/14: 26.5% 11/15: 24.38% 12/5: 26%; 05/09/2018: 29% 2/6: 23.75% 526: 30%    Time  8    Period  Weeks    Status  Partially Met    Target Date  11/26/18      PT LONG TERM GOAL #5   Title  Patient will negotiate one step ~4 inches to enter house independently to increase functional independence in natural environment    Baseline  /20: unable to do 9/26: patient too fatigued/not feeling well to perform 10/14: unable to perform at this time 11/15: unable to perform at this time; 12/5: able to lift 1 inch; 05/09/2018: unable to clear 4" step, able to lift foot ~ 1in 5/26: has been unable to perform    Time  8    Period  Weeks    Status  On-going    Target Date  11/26/18      PT LONG TERM GOAL #6   Title  Pt will be able to perform tub bench into/out of shower transfer without  assist for improved independence with this task    Baseline  challenged with steps at this time, getting better, stands and gives sponge bath independently now  9/26: has not tried transfer yet 11/15: has not attempted yet; 05/09/2018: not attempted 5/26: able to do one time but needs a new shower chair    Time  8    Period  Weeks    Status  Partially Met    Target Date  11/26/18      PT LONG TERM GOAL #7   Title  Patient will stand 5 minutes to increase standing duration to allow patient to return to washing dishes and clothes.     Baseline  12/5: 56 seconds; 05/09/2018: 1 min 18 sec 2/6: 2 minutes last session 5/26: 1 minute 36 seconds limited by breathing    Time  8    Period  Weeks    Status  Partially Met    Target Date  11/26/18      PT LONG TERM GOAL #8  Title  Patient will negotiate 30 ft of changing surfaces outside such as pavement and parking lot to allow patient to walk into church.    Baseline  12/5: does not walk outside 2/24: unable to test due to rain 5/26: unable since COVID    Time  8    Period  Weeks    Status  On-going    Target Date  11/26/18            Plan - 10/04/18 1003    Clinical Impression Statement  Pt doing well this date, began intergrating prior interventions back into plan of care to forward progress toward goals. Pt able to progress sustained standing tolerace to 3 minutes this date, still limited mostly by quads fatigue. Attempted 2nd walk at end of session, but patient more fatigued and limited, with noted dragging of left toes. Pt elects to shorten bout to conserve energy and not over fatigue herself.     Rehab Potential  Good    PT Frequency  2x / week    PT Duration  8 weeks    PT Treatment/Interventions  ADLs/Self Care Home Management;Aquatic Therapy;Biofeedback;Cryotherapy;Electrical Stimulation;Iontophoresis 52m/ml Dexamethasone;Traction;Moist Heat;Ultrasound;DME Instruction;Gait training;Stair training;Functional mobility training;Therapeutic  activities;Therapeutic exercise;Balance training;Neuromuscular re-education;Patient/family education;Orthotic Fit/Training;Wheelchair mobility training;Manual techniques;Compression bandaging;Passive range of motion;Dry needling;Energy conservation;Taping;Splinting    PT Next Visit Plan  strength/balance    PT Home Exercise Plan  to initiate next session (please incorporate hip flexor and adductor stretches in HEP if possible)    Consulted and Agree with Plan of Care  Patient       Patient will benefit from skilled therapeutic intervention in order to improve the following deficits and impairments:  Abnormal gait, Decreased activity tolerance, Decreased balance, Decreased coordination, Decreased endurance, Decreased knowledge of use of DME, Decreased mobility, Decreased range of motion, Decreased safety awareness, Decreased strength, Difficulty walking, Impaired perceived functional ability, Impaired sensation, Impaired tone, Impaired UE functional use, Improper body mechanics, Postural dysfunction  Visit Diagnosis: Muscle weakness (generalized)  Other abnormalities of gait and mobility  Unsteadiness on feet  Radiculopathy, cervical region     Problem List Patient Active Problem List   Diagnosis Date Noted  . Abscess of female pelvis   . SVT (supraventricular tachycardia) ([place]   . Radial styloid tenosynovitis 03/12/2018  . Wheelchair confinement 02/27/2018  . Localized osteoporosis with current pathological fracture with routine healing 01/19/2017  . Wrist fracture 01/16/2017  . Sprain of ankle 03/23/2016  . Closed fracture of lateral malleolus 03/16/2016  . Health care maintenance 01/24/2016  . Blood pressure elevated without history of HTN 10/25/2015  . Essential hypertension 10/25/2015  . Multiple sclerosis ([place] 10/02/2015  . Chronic left shoulder pain 07/19/2015  . Multiple sclerosis exacerbation ([place] 07/14/2015  . MS (multiple sclerosis) ([place] 11/26/2014  . Increased  body mass index 11/26/2014  . HPV test positive 11/26/2014  . Status post laparoscopic supracervical hysterectomy 11/26/2014  . Galactorrhea 11/26/2014  . Back ache 05/21/2014  . Adiposity 05/21/2014  . Disordered sleep 05/21/2014  . Muscle spasticity 05/21/2014  . Spasticity 05/21/2014  . Calculus of kidney 12/09/2013  . Renal colic 036/64/4034 . Hypercholesteremia 08/19/2013  . Hereditary and idiopathic neuropathy 08/19/2013  . Hypercholesterolemia without hypertriglyceridemia 08/19/2013  . Bladder infection, chronic 07/25/2012  . Disorder of bladder function 07/25/2012  . Incomplete bladder emptying 07/25/2012  . Microscopic hematuria 07/25/2012  . Right upper quadrant pain 07/25/2012   10:07 AM, 10/04/18 AEtta Grandchild PT, DPT Physical Therapist - [email or web address] [phone]     Etta Grandchild 10/04/2018, 10:05 AM  [place] MAIN [facility] SERVICES [address] [place], [place], [address] Phone: [phone]   Fax:  [phone]  Name: Lisa Williams MRN: [id number] Date of Birth: May 13, 1960

## 2018-10-07 ENCOUNTER — Ambulatory Visit: Payer: Medicare HMO | Attending: Neurology

## 2018-10-07 ENCOUNTER — Other Ambulatory Visit: Payer: Self-pay

## 2018-10-07 DIAGNOSIS — M6281 Muscle weakness (generalized): Secondary | ICD-10-CM | POA: Diagnosis not present

## 2018-10-07 DIAGNOSIS — R2681 Unsteadiness on feet: Secondary | ICD-10-CM

## 2018-10-07 DIAGNOSIS — M5412 Radiculopathy, cervical region: Secondary | ICD-10-CM | POA: Insufficient documentation

## 2018-10-07 DIAGNOSIS — R2689 Other abnormalities of gait and mobility: Secondary | ICD-10-CM

## 2018-10-07 NOTE — Therapy (Signed)
[place] MAIN [facility] SERVICES [address] [place], [place], [address] Phone: [phone]   Fax:  [phone]  Physical Therapy Treatment  Patient Details  Name: Lisa Williams MRN: [id number] Date of Birth: 18-Nov-1960 Referring Provider (PT): Gurney Maxin, MD   Encounter Date: 10/07/2018  PT End of Session - 10/07/18 1104    Visit Number  40    Number of Visits  99    Date for PT Re-Evaluation  11/26/18    Authorization Type  3/10 starting 5/26)     PT Start Time  1014    PT Stop Time  1102    PT Time Calculation (min)  48 min    Equipment Utilized During Treatment  Gait belt    Activity Tolerance  Patient tolerated treatment well;Other (comment)   mask is limiting   Behavior During Therapy  WFL for tasks assessed/performed       Past Medical History:  Diagnosis Date  . Abdominal pain, right upper quadrant   . Back pain   . Calculus of kidney 12/09/2013  . Chronic back pain    unspecified  . Chronic left shoulder pain 07/19/2015  . Functional disorder of bladder    other  . Galactorrhea 11/26/2014   Chronic   . Hereditary and idiopathic neuropathy 08/19/2013  . HPV test positive   . Hypercholesteremia 08/19/2013  . Incomplete bladder emptying   . Microscopic hematuria   . MS (multiple sclerosis) ([place])   . Muscle spasticity 05/21/2014  . Nonspecific findings on examination of urine    other  . Osteopenia   . Status post laparoscopic supracervical hysterectomy 11/26/2014  . Tobacco user 11/26/2014  . Wrist fracture     Past Surgical History:  Procedure Laterality Date  . bilateral tubal ligation  1996  . BREAST CYST EXCISION Left 2002  . KNEE SURGERY     right  . LAPAROSCOPIC SUPRACERVICAL HYSTERECTOMY  08/05/2013  . ORIF WRIST FRACTURE Left 01/17/2017   Procedure: OPEN REDUCTION INTERNAL FIXATION (ORIF) WRIST FRACTURE;  Surgeon: Lovell Sheehan, MD;  Location: [facility] ORS;  Service: Orthopedics;  Laterality: Left;  .  TUBAL LIGATION Bilateral   . VAGINAL HYSTERECTOMY  03/2006    There were no vitals filed for this visit.  Subjective Assessment - 10/07/18 1102    Subjective  Patient reports she has been doing her HEP. No pain or LOB since last session.     Patient is accompained by:  Family member    Pertinent History  Pt is a 58 y/o F who presents with BLE weakness and difficulty ambulating due to h/o MS that was diagnosed in 1995. Pt denies any pain associated. Pt was admitted to the hospital in November 2018 due to MS exacerbation. Nov-Dec to HHPT-->[facility] until end of Jan. After rehab the pt was able to ambulate up to 100 ft with the RW. Pt has Bil AFO, her therapist at [facility] thinks she needs a new AFO as her feet still drag when she ambulates, pt reports HHPT mentioned a spring AFO. Pt has custom AFOs that were originally made 4 years ago. Goals: improve balance, to be able to get into the shower with her tub bench without assist, to be able to get up 2 steps at sister's home with railings on both sides but too far apart to reach both sides, to be able to drive adapted car. Pt is staying at other sister's house with one step to get inside  and currently picking up L leg with UEs and then stepping in. Pt steps down leading with LLE. Pt wants to work on walking side to side as this is challenging and she had not yet progressed to this at rehab. Pt able to get in/out the car with assist only to bring BLEs into the car. Hoping to drive by June or July with adapted car (already has one). Pt needs assist bringing LEs into and out of hospital bed. Pt able to perform sit>stand without assist from [facility] with a pillow on the seat for a boost. Pt denies any falls in the past 6 months. Pt has been increasing her time alone at home up to a few hours at a time to become more independent. Pt has both manual and power WC. Uses power WC at home, uses manual WC out in community. Pt able to self propel manual WC. Pt needs  assist putting pants around ankles but she can pull them up. Pt can put on her shoes but needs help getting her leg up to do so. Pt able to tie her shoelaces on her own. Pt has a tub/shower unit at home with a tub bench. No grab bars in the shower. Has hand held shower head. Pt currently taking a sponge bath. Pt has a regular height toilet with a BSC over top. Pt with h/o L wrist fx with no precautions, pt wore her soft brace to evaluation for comfort but reports her wrist has healed and is doing well following OT.     Limitations  Lifting;Standing;Walking;House hold activities    Currently in Pain?  No/denies           Standing:   -Lateral weight shift x45 seconds --> lateral weight shift , RW  improved stability with decreased reliance upon UE's for weight shift; requires BUE support for balance; fatigue towards end of set.   Lateral weight shift with lifting of foot x 10 each LE   -Short step with AP weight shift x 8 each LE; demonstrates good weight shifting onto forward foot bilaterally; fatigue towards end of set cueing not to lean trunk too far forward with L foot, challenged with LLE> RLE        Seated:    seated pelvic posterior anterior tilts 15x for upright posture   Seated upper case alphabet with rainbow ball for core; patient indicates it is more difficult than she originally thought, but that it is a good challenge for her; patient benefits from intermittent verbal cueing for breathing               isometric adduction against PT hand 10x 3 second holds               Isometric abduction against PT hand 10x 3 second holds   Isometric hip flexion against PT hand 10x 3 second holds    Seated shoulder retraction with flexed elbow "chicken wing" 10x each LE    3lb alternating punches to PT hand, focus  2 sets of 60 seconds.      Pt educated throughout session about proper posture and technique with exercises. Improved exercise technique, movement at target joints, use of  target muscles after min verbal, visual, tactile cues   Seated rest breaks and use of ice pack utilized to keep body temperature at functional range to reduce MS exacerbation.  PT Education - 10/07/18 1104    Education provided  Yes    Education Details  exercise technique, stability     Person(s) Educated  Patient    Methods  Explanation;Demonstration;Tactile cues;Verbal cues    Comprehension  Verbalized understanding;Returned demonstration;Verbal cues required;Tactile cues required;Need further instruction       PT Short Term Goals - 10/01/18 1006      PT SHORT TERM GOAL #1   Title  Patient will be compliant with HEP 4-5 days/week for improved carryover between sessions.     Baseline  HEP compliancy between sessions    Time  2    Period  Weeks    Status  New    Target Date  10/15/18      PT SHORT TERM GOAL #2   Title  Patient will report no falls/LOB to indicate improved stability with mobility     Baseline  new    Time  2    Period  Weeks    Status  New    Target Date  10/15/18        PT Long Term Goals - 10/01/18 0001      PT LONG TERM GOAL #1   Title  Patient will perform TUG in 40 seconds or less with walker to improve safe negotiation of environment.     Baseline  5/26: 61 seconds    Time  8    Period  Weeks    Status  New    Target Date  11/26/18      PT LONG TERM GOAL #2   Title  Pt's 50mT will improve to at least 0.5 m/s for improved safety ambulating in home    Baseline  0.14 m/s; 5/15: .243m  7/10: .36 m/s  8/20: .42 m/s  9/26: 0.2642m10/14: .26 m/s improved hip and knee clearance 11/15: 0.28m26mmproved hip and knee clearance; 12/5: .29 m/s improved step length and forward moment: decreased foot drag. 05/09/2018: 0.21 m/s 2/6: .237 m/s 5/26: .23 m/s    Time  8    Period  Weeks    Status  Partially Met    Target Date  11/26/18      PT LONG TERM GOAL #3   Title  Pt's Bil hip F strength will improve to at least  2+/5 BLEs for improved gait mechanics and safety    Baseline  L: 2/5, R: 1/5 7/10: 2/5 bilaterally  8/20: 2/5 bilat 9/26: 2/5 bilat 10/14: 2/5 bilaterally ,improved hip flexor muscle activation 2/5: improved hip flexor and quad activation: 12/5: 2/5 ; 05/09/2018: 2/5 2/6: 3/5 knee, 2 to 2+/5 hip 5/26: 2-/5 hips 3/5 knees    Time  8    Period  Weeks    Status  Partially Met    Target Date  11/26/18      PT LONG TERM GOAL #4   Title  Pt's ABC Scale will improve to at least 40% to demonstrate improved balance    Baseline  28.13% 5/15; 24.4% 7/10: 23.4% 8/20: 22. 2% 9/26: 25.3% 10/14: 26.5% 11/15: 24.38% 12/5: 26%; 05/09/2018: 29% 2/6: 23.75% 526: 30%    Time  8    Period  Weeks    Status  Partially Met    Target Date  11/26/18      PT LONG TERM GOAL #5   Title  Patient will negotiate one step ~4 inches to enter house independently to increase functional independence in natural environment    Baseline  /  20: unable to do 9/26: patient too fatigued/not feeling well to perform 10/14: unable to perform at this time 11/15: unable to perform at this time; 12/5: able to lift 1 inch; 05/09/2018: unable to clear 4" step, able to lift foot ~ 1in 5/26: has been unable to perform    Time  8    Period  Weeks    Status  On-going    Target Date  11/26/18      PT LONG TERM GOAL #6   Title  Pt will be able to perform tub bench into/out of shower transfer without assist for improved independence with this task    Baseline  challenged with steps at this time, getting better, stands and gives sponge bath independently now  9/26: has not tried transfer yet 11/15: has not attempted yet; 05/09/2018: not attempted 5/26: able to do one time but needs a new shower chair    Time  8    Period  Weeks    Status  Partially Met    Target Date  11/26/18      PT LONG TERM GOAL #7   Title  Patient will stand 5 minutes to increase standing duration to allow patient to return to washing dishes and clothes.     Baseline  12/5: 56  seconds; 05/09/2018: 1 min 18 sec 2/6: 2 minutes last session 5/26: 1 minute 36 seconds limited by breathing    Time  8    Period  Weeks    Status  Partially Met    Target Date  11/26/18      PT LONG TERM GOAL #8   Title  Patient will negotiate 30 ft of changing surfaces outside such as pavement and parking lot to allow patient to walk into church.    Baseline  12/5: does not walk outside 2/24: unable to test due to rain 5/26: unable since COVID    Time  8    Period  Weeks    Status  On-going    Target Date  11/26/18            Plan - 10/07/18 1107    Clinical Impression Statement  Patient requires more frequent rest breaks due to overheating this session. Patient demonstrating improved ability to clear LE with standing interventions for carryover to ambulation. The patient will continue to benefit from skilled therapeutic intervention to address deficits in strength, mobility, balance, and function for improved overall QOL and safety.     Rehab Potential  Good    PT Frequency  2x / week    PT Duration  8 weeks    PT Treatment/Interventions  ADLs/Self Care Home Management;Aquatic Therapy;Biofeedback;Cryotherapy;Electrical Stimulation;Iontophoresis 93m/ml Dexamethasone;Traction;Moist Heat;Ultrasound;DME Instruction;Gait training;Stair training;Functional mobility training;Therapeutic activities;Therapeutic exercise;Balance training;Neuromuscular re-education;Patient/family education;Orthotic Fit/Training;Wheelchair mobility training;Manual techniques;Compression bandaging;Passive range of motion;Dry needling;Energy conservation;Taping;Splinting    PT Next Visit Plan  strength/balance    PT Home Exercise Plan  to initiate next session (please incorporate hip flexor and adductor stretches in HEP if possible)    Consulted and Agree with Plan of Care  Patient       Patient will benefit from skilled therapeutic intervention in order to improve the following deficits and impairments:   Abnormal gait, Decreased activity tolerance, Decreased balance, Decreased coordination, Decreased endurance, Decreased knowledge of use of DME, Decreased mobility, Decreased range of motion, Decreased safety awareness, Decreased strength, Difficulty walking, Impaired perceived functional ability, Impaired sensation, Impaired tone, Impaired UE functional use, Improper body mechanics, Postural dysfunction  Visit  Diagnosis: Muscle weakness (generalized)  Other abnormalities of gait and mobility  Unsteadiness on feet     Problem List Patient Active Problem List   Diagnosis Date Noted  . Abscess of female pelvis   . SVT (supraventricular tachycardia) ([place])   . Radial styloid tenosynovitis 03/12/2018  . Wheelchair confinement 02/27/2018  . Localized osteoporosis with current pathological fracture with routine healing 01/19/2017  . Wrist fracture 01/16/2017  . Sprain of ankle 03/23/2016  . Closed fracture of lateral malleolus 03/16/2016  . Health care maintenance 01/24/2016  . Blood pressure elevated without history of HTN 10/25/2015  . Essential hypertension 10/25/2015  . Multiple sclerosis ([place]) 10/02/2015  . Chronic left shoulder pain 07/19/2015  . Multiple sclerosis exacerbation ([place]) 07/14/2015  . MS (multiple sclerosis) ([place]) 11/26/2014  . Increased body mass index 11/26/2014  . HPV test positive 11/26/2014  . Status post laparoscopic supracervical hysterectomy 11/26/2014  . Galactorrhea 11/26/2014  . Back ache 05/21/2014  . Adiposity 05/21/2014  . Disordered sleep 05/21/2014  . Muscle spasticity 05/21/2014  . Spasticity 05/21/2014  . Calculus of kidney 12/09/2013  . Renal colic 37/36/6815  . Hypercholesteremia 08/19/2013  . Hereditary and idiopathic neuropathy 08/19/2013  . Hypercholesterolemia without hypertriglyceridemia 08/19/2013  . Bladder infection, chronic 07/25/2012  . Disorder of bladder function 07/25/2012  . Incomplete bladder emptying 07/25/2012  .  Microscopic hematuria 07/25/2012  . Right upper quadrant pain 07/25/2012   Janna Arch, PT, DPT   10/07/2018, 12:57 PM  [place] MAIN [facility] SERVICES [address] [place], [place], [address] Phone: [phone]   Fax:  [phone]  Name: Lisa Williams MRN: [id number] Date of Birth: [birth year]

## 2018-10-09 ENCOUNTER — Encounter: Payer: Self-pay | Admitting: Pharmacy Technician

## 2018-10-09 ENCOUNTER — Ambulatory Visit: Payer: Medicare HMO

## 2018-10-09 ENCOUNTER — Other Ambulatory Visit: Payer: Self-pay

## 2018-10-09 DIAGNOSIS — M6281 Muscle weakness (generalized): Secondary | ICD-10-CM

## 2018-10-09 DIAGNOSIS — R2689 Other abnormalities of gait and mobility: Secondary | ICD-10-CM

## 2018-10-09 DIAGNOSIS — R2681 Unsteadiness on feet: Secondary | ICD-10-CM

## 2018-10-09 NOTE — Therapy (Signed)
[place] MAIN [facility] SERVICES [place], [place], [address] Phone: [phone]   Fax:  [phone]  Physical Therapy Treatment  Patient Details  Name: Lisa Williams MRN: [id number] Date of Birth: [birth year] Referring Provider (PT): Gurney Maxin, MD   Encounter Date: 10/09/2018  PT End of Session - 10/09/18 7619    Visit Number  63    Number of Visits  99    Date for PT Re-Evaluation  11/26/18    Authorization Type  4/10 starting 5/26)     PT Start Time  1015    PT Stop Time  1101    PT Time Calculation (min)  46 min    Equipment Utilized During Treatment  Gait belt    Activity Tolerance  Patient tolerated treatment well;Other (comment)   mask is limiting   Behavior During Therapy  WFL for tasks assessed/performed       Past Medical History:  Diagnosis Date  . Abdominal pain, right upper quadrant   . Back pain   . Calculus of kidney 12/09/2013  . Chronic back pain    unspecified  . Chronic left shoulder pain 07/19/2015  . Functional disorder of bladder    other  . Galactorrhea 11/26/2014   Chronic   . Hereditary and idiopathic neuropathy 08/19/2013  . HPV test positive   . Hypercholesteremia 08/19/2013  . Incomplete bladder emptying   . Microscopic hematuria   . MS (multiple sclerosis) ([place])   . Muscle spasticity 05/21/2014  . Nonspecific findings on examination of urine    other  . Osteopenia   . Status post laparoscopic supracervical hysterectomy 11/26/2014  . Tobacco user 11/26/2014  . Wrist fracture     Past Surgical History:  Procedure Laterality Date  . bilateral tubal ligation  1996  . BREAST CYST EXCISION Left 2002  . KNEE SURGERY     right  . LAPAROSCOPIC SUPRACERVICAL HYSTERECTOMY  08/05/2013  . ORIF WRIST FRACTURE Left 01/17/2017   Procedure: OPEN REDUCTION INTERNAL FIXATION (ORIF) WRIST FRACTURE;  Surgeon: Lovell Sheehan, MD;  Location: [facility] ORS;  Service: Orthopedics;  Laterality: Left;  .  TUBAL LIGATION Bilateral   . VAGINAL HYSTERECTOMY  03/2006    There were no vitals filed for this visit.  Subjective Assessment - 10/09/18 1045    Subjective  Patient reports she is doing well. Has been careful with the heat outside, no LOB or Falls    Patient is accompained by:  Family member    Pertinent History  Pt is a 58 y/o F who presents with BLE weakness and difficulty ambulating due to h/o MS that was diagnosed in 1995. Pt denies any pain associated. Pt was admitted to the hospital in November 2018 due to MS exacerbation. Nov-Dec to HHPT-->[facility] until end of Jan. After rehab the pt was able to ambulate up to 100 ft with the RW. Pt has Bil AFO, her therapist at [facility] thinks she needs a new AFO as her feet still drag when she ambulates, pt reports HHPT mentioned a spring AFO. Pt has custom AFOs that were originally made 4 years ago. Goals: improve balance, to be able to get into the shower with her tub bench without assist, to be able to get up 2 steps at sister's home with railings on both sides but too far apart to reach both sides, to be able to drive adapted car. Pt is staying at other sister's house with one step to get  inside and currently picking up L leg with UEs and then stepping in. Pt steps down leading with LLE. Pt wants to work on walking side to side as this is challenging and she had not yet progressed to this at rehab. Pt able to get in/out the car with assist only to bring BLEs into the car. Hoping to drive by June or July with adapted car (already has one). Pt needs assist bringing LEs into and out of hospital bed. Pt able to perform sit>stand without assist from [facility] with a pillow on the seat for a boost. Pt denies any falls in the past 6 months. Pt has been increasing her time alone at home up to a few hours at a time to become more independent. Pt has both manual and power WC. Uses power WC at home, uses manual WC out in community. Pt able to self propel manual WC. Pt  needs assist putting pants around ankles but she can pull them up. Pt can put on her shoes but needs help getting her leg up to do so. Pt able to tie her shoelaces on her own. Pt has a tub/shower unit at home with a tub bench. No grab bars in the shower. Has hand held shower head. Pt currently taking a sponge bath. Pt has a regular height toilet with a BSC over top. Pt with h/o L wrist fx with no precautions, pt wore her soft brace to evaluation for comfort but reports her wrist has healed and is doing well following OT.     Limitations  Lifting;Standing;Walking;House hold activities    Currently in Pain?  No/denies      Ambulate 60 ft with RW and CGa with w/c follow. Verbal cueing for sequencing, anterior pelvic tilt, weight shift, and foot clearance.    Standing:   -anterior posterior pelvic tilts in standing with RW 12x, very challenging to patient        Seated:               seated pelvic posterior anterior tilts 15x for upright posture    2000 Gr weighted ball:    -ball toss to varying planes of movement   -chest press 15x   -large diameter circle, slow circles 10x each direction   -chest to head upright press 15x    -scapular retractions 15x 3 second holds   Seated LAQ modified 10x           Seated gluteal squeezes 15x 3 second holds    TrA activation 10x 3 second holds               isometric adduction against PT hand 10x 3 second holds               Isometric abduction against PT hand 10x 3 second holds               Isometric hip flexion against PT hand 10x 3 second holds               Seated shoulder retraction with flexed elbow "chicken wing" 10x each LE                 Pt educated throughout session about proper posture and technique with exercises. Improved exercise technique, movement at target joints, use of target muscles after min verbal, visual, tactile cues   Seated rest breaks and use of ice pack utilized to keep body temperature at functional range to  reduce MS exacerbation.  PT Education - 10/09/18 1047    Education provided  Yes    Education Details  exercise technique, stability     Person(s) Educated  Patient    Methods  Explanation;Demonstration;Tactile cues;Verbal cues    Comprehension  Verbalized understanding;Returned demonstration;Verbal cues required;Tactile cues required       PT Short Term Goals - 10/01/18 1006      PT SHORT TERM GOAL #1   Title  Patient will be compliant with HEP 4-5 days/week for improved carryover between sessions.     Baseline  HEP compliancy between sessions    Time  2    Period  Weeks    Status  New    Target Date  10/15/18      PT SHORT TERM GOAL #2   Title  Patient will report no falls/LOB to indicate improved stability with mobility     Baseline  new    Time  2    Period  Weeks    Status  New    Target Date  10/15/18        PT Long Term Goals - 10/01/18 0001      PT LONG TERM GOAL #1   Title  Patient will perform TUG in 40 seconds or less with walker to improve safe negotiation of environment.     Baseline  5/26: 61 seconds    Time  8    Period  Weeks    Status  New    Target Date  11/26/18      PT LONG TERM GOAL #2   Title  Pt's 33mT will improve to at least 0.5 m/s for improved safety ambulating in home    Baseline  0.14 m/s; 5/15: .285m  7/10: .36 m/s  8/20: .42 m/s  9/26: 0.2675m10/14: .26 m/s improved hip and knee clearance 11/15: 0.87m3mmproved hip and knee clearance; 12/5: .29 m/s improved step length and forward moment: decreased foot drag. 05/09/2018: 0.21 m/s 2/6: .237 m/s 5/26: .23 m/s    Time  8    Period  Weeks    Status  Partially Met    Target Date  11/26/18      PT LONG TERM GOAL #3   Title  Pt's Bil hip F strength will improve to at least 2+/5 BLEs for improved gait mechanics and safety    Baseline  L: 2/5, R: 1/5 7/10: 2/5 bilaterally  8/20: 2/5 bilat 9/26: 2/5 bilat 10/14: 2/5 bilaterally ,improved hip flexor muscle  activation 2/5: improved hip flexor and quad activation: 12/5: 2/5 ; 05/09/2018: 2/5 2/6: 3/5 knee, 2 to 2+/5 hip 5/26: 2-/5 hips 3/5 knees    Time  8    Period  Weeks    Status  Partially Met    Target Date  11/26/18      PT LONG TERM GOAL #4   Title  Pt's ABC Scale will improve to at least 40% to demonstrate improved balance    Baseline  28.13% 5/15; 24.4% 7/10: 23.4% 8/20: 22. 2% 9/26: 25.3% 10/14: 26.5% 11/15: 24.38% 12/5: 26%; 05/09/2018: 29% 2/6: 23.75% 526: 30%    Time  8    Period  Weeks    Status  Partially Met    Target Date  11/26/18      PT LONG TERM GOAL #5   Title  Patient will negotiate one step ~4 inches to enter house independently to increase functional independence in natural environment    Baseline  /20: unable  to do 9/26: patient too fatigued/not feeling well to perform 10/14: unable to perform at this time 11/15: unable to perform at this time; 12/5: able to lift 1 inch; 05/09/2018: unable to clear 4" step, able to lift foot ~ 1in 5/26: has been unable to perform    Time  8    Period  Weeks    Status  On-going    Target Date  11/26/18      PT LONG TERM GOAL #6   Title  Pt will be able to perform tub bench into/out of shower transfer without assist for improved independence with this task    Baseline  challenged with steps at this time, getting better, stands and gives sponge bath independently now  9/26: has not tried transfer yet 11/15: has not attempted yet; 05/09/2018: not attempted 5/26: able to do one time but needs a new shower chair    Time  8    Period  Weeks    Status  Partially Met    Target Date  11/26/18      PT LONG TERM GOAL #7   Title  Patient will stand 5 minutes to increase standing duration to allow patient to return to washing dishes and clothes.     Baseline  12/5: 56 seconds; 05/09/2018: 1 min 18 sec 2/6: 2 minutes last session 5/26: 1 minute 36 seconds limited by breathing    Time  8    Period  Weeks    Status  Partially Met    Target Date   11/26/18      PT LONG TERM GOAL #8   Title  Patient will negotiate 30 ft of changing surfaces outside such as pavement and parking lot to allow patient to walk into church.    Baseline  12/5: does not walk outside 2/24: unable to test due to rain 5/26: unable since COVID    Time  8    Period  Weeks    Status  On-going    Target Date  11/26/18            Plan - 10/09/18 1049    Clinical Impression Statement  Patient demonstrated improved ability to ambulate with foot clearance, clearing bilateral feet ~80% of the time with verbal cueing for alignment. Patient requires frequent rest breaks due to fatigue from increased intensity of interventions. The patient will continue to benefit from skilled therapeutic intervention to address deficits in strength, mobility, balance, and function for improved overall QOL and safety.     Rehab Potential  Good    PT Frequency  2x / week    PT Duration  8 weeks    PT Treatment/Interventions  ADLs/Self Care Home Management;Aquatic Therapy;Biofeedback;Cryotherapy;Electrical Stimulation;Iontophoresis 60m/ml Dexamethasone;Traction;Moist Heat;Ultrasound;DME Instruction;Gait training;Stair training;Functional mobility training;Therapeutic activities;Therapeutic exercise;Balance training;Neuromuscular re-education;Patient/family education;Orthotic Fit/Training;Wheelchair mobility training;Manual techniques;Compression bandaging;Passive range of motion;Dry needling;Energy conservation;Taping;Splinting    PT Next Visit Plan  strength/balance    PT Home Exercise Plan  to initiate next session (please incorporate hip flexor and adductor stretches in HEP if possible)    Consulted and Agree with Plan of Care  Patient       Patient will benefit from skilled therapeutic intervention in order to improve the following deficits and impairments:  Abnormal gait, Decreased activity tolerance, Decreased balance, Decreased coordination, Decreased endurance, Decreased knowledge  of use of DME, Decreased mobility, Decreased range of motion, Decreased safety awareness, Decreased strength, Difficulty walking, Impaired perceived functional ability, Impaired sensation, Impaired tone, Impaired UE functional use,  Improper body mechanics, Postural dysfunction  Visit Diagnosis: Muscle weakness (generalized)  Other abnormalities of gait and mobility  Unsteadiness on feet     Problem List Patient Active Problem List   Diagnosis Date Noted  . Abscess of female pelvis   . SVT (supraventricular tachycardia) ([place])   . Radial styloid tenosynovitis 03/12/2018  . Wheelchair confinement 02/27/2018  . Localized osteoporosis with current pathological fracture with routine healing 01/19/2017  . Wrist fracture 01/16/2017  . Sprain of ankle 03/23/2016  . Closed fracture of lateral malleolus 03/16/2016  . Health care maintenance 01/24/2016  . Blood pressure elevated without history of HTN 10/25/2015  . Essential hypertension 10/25/2015  . Multiple sclerosis ([place]) 10/02/2015  . Chronic left shoulder pain 07/19/2015  . Multiple sclerosis exacerbation ([place]) 07/14/2015  . MS (multiple sclerosis) ([place]) 11/26/2014  . Increased body mass index 11/26/2014  . HPV test positive 11/26/2014  . Status post laparoscopic supracervical hysterectomy 11/26/2014  . Galactorrhea 11/26/2014  . Back ache 05/21/2014  . Adiposity 05/21/2014  . Disordered sleep 05/21/2014  . Muscle spasticity 05/21/2014  . Spasticity 05/21/2014  . Calculus of kidney 12/09/2013  . Renal colic 61/22/4001  . Hypercholesteremia 08/19/2013  . Hereditary and idiopathic neuropathy 08/19/2013  . Hypercholesterolemia without hypertriglyceridemia 08/19/2013  . Bladder infection, chronic 07/25/2012  . Disorder of bladder function 07/25/2012  . Incomplete bladder emptying 07/25/2012  . Microscopic hematuria 07/25/2012  . Right upper quadrant pain 07/25/2012   Janna Arch, PT, DPT   10/09/2018, 11:11 AM  [place] MAIN [facility] SERVICES [address] [place], [place], [address] Phone: [phone]   Fax:  [phone]  Name: Lisa Williams MRN: [id number] Date of Birth: 11/27/1960

## 2018-10-09 NOTE — Progress Notes (Signed)
Patient has been approved for drug assistance by [place] for Rituxan. The enrollment period is from 02/27/18-02/27/19 based on EOOP. First DOS covered is 06/12/18.

## 2018-10-14 ENCOUNTER — Ambulatory Visit: Payer: Medicare HMO

## 2018-10-14 ENCOUNTER — Other Ambulatory Visit: Payer: Self-pay

## 2018-10-14 DIAGNOSIS — R2681 Unsteadiness on feet: Secondary | ICD-10-CM

## 2018-10-14 DIAGNOSIS — M6281 Muscle weakness (generalized): Secondary | ICD-10-CM | POA: Diagnosis not present

## 2018-10-14 DIAGNOSIS — R2689 Other abnormalities of gait and mobility: Secondary | ICD-10-CM

## 2018-10-14 NOTE — Therapy (Signed)
[place] MAIN [facility] SERVICES [address] [place], [place], [address] Phone: [phone]   Fax:  [phone]  Physical Therapy Treatment  Patient Details  Name: Lisa Williams MRN: [id number] Date of Birth: 1961-01-11 Referring Provider (PT): Gurney Maxin, MD   Encounter Date: 10/14/2018  PT End of Session - 10/14/18 1031    Visit Number  45    Number of Visits  99    Date for PT Re-Evaluation  11/26/18    Authorization Type  5/10 starting 5/26)     PT Start Time  1014    PT Stop Time  1100    PT Time Calculation (min)  46 min    Equipment Utilized During Treatment  Gait belt    Activity Tolerance  Patient tolerated treatment well;Other (comment)   mask is limiting   Behavior During Therapy  WFL for tasks assessed/performed       Past Medical History:  Diagnosis Date  . Abdominal pain, right upper quadrant   . Back pain   . Calculus of kidney 12/09/2013  . Chronic back pain    unspecified  . Chronic left shoulder pain 07/19/2015  . Functional disorder of bladder    other  . Galactorrhea 11/26/2014   Chronic   . Hereditary and idiopathic neuropathy 08/19/2013  . HPV test positive   . Hypercholesteremia 08/19/2013  . Incomplete bladder emptying   . Microscopic hematuria   . MS (multiple sclerosis) ([place])   . Muscle spasticity 05/21/2014  . Nonspecific findings on examination of urine    other  . Osteopenia   . Status post laparoscopic supracervical hysterectomy 11/26/2014  . Tobacco user 11/26/2014  . Wrist fracture     Past Surgical History:  Procedure Laterality Date  . bilateral tubal ligation  1996  . BREAST CYST EXCISION Left 2002  . KNEE SURGERY     right  . LAPAROSCOPIC SUPRACERVICAL HYSTERECTOMY  08/05/2013  . ORIF WRIST FRACTURE Left 01/17/2017   Procedure: OPEN REDUCTION INTERNAL FIXATION (ORIF) WRIST FRACTURE;  Surgeon: Lovell Sheehan, MD;  Location: [facility] ORS;  Service: Orthopedics;  Laterality: Left;  .  TUBAL LIGATION Bilateral   . VAGINAL HYSTERECTOMY  03/2006    There were no vitals filed for this visit.  Subjective Assessment - 10/14/18 1023    Subjective  Patient reports she got a 5lb medicine ball, as well as 2-3 lb dumbbells. Has been using them since saturday.     Patient is accompained by:  Family member    Pertinent History  Pt is a 58 y/o F who presents with BLE weakness and difficulty ambulating due to h/o MS that was diagnosed in 1995. Pt denies any pain associated. Pt was admitted to the hospital in November 2018 due to MS exacerbation. Nov-Dec to HHPT-->[facility] until end of Jan. After rehab the pt was able to ambulate up to 100 ft with the RW. Pt has Bil AFO, her therapist at [place] thinks she needs a new AFO as her feet still drag when she ambulates, pt reports HHPT mentioned a spring AFO. Pt has custom AFOs that were originally made 4 years ago. Goals: improve balance, to be able to get into the shower with her tub bench without assist, to be able to get up 2 steps at sister's home with railings on both sides but too far apart to reach both sides, to be able to drive adapted car. Pt is staying at other sister's house with  one step to get inside and currently picking up L leg with UEs and then stepping in. Pt steps down leading with LLE. Pt wants to work on walking side to side as this is challenging and she had not yet progressed to this at rehab. Pt able to get in/out the car with assist only to bring BLEs into the car. Hoping to drive by June or July with adapted car (already has one). Pt needs assist bringing LEs into and out of hospital bed. Pt able to perform sit>stand without assist from [facility] with a pillow on the seat for a boost. Pt denies any falls in the past 6 months. Pt has been increasing her time alone at home up to a few hours at a time to become more independent. Pt has both manual and power WC. Uses power WC at home, uses manual WC out in community. Pt able to self  propel manual WC. Pt needs assist putting pants around ankles but she can pull them up. Pt can put on her shoes but needs help getting her leg up to do so. Pt able to tie her shoelaces on her own. Pt has a tub/shower unit at home with a tub bench. No grab bars in the shower. Has hand held shower head. Pt currently taking a sponge bath. Pt has a regular height toilet with a BSC over top. Pt with h/o L wrist fx with no precautions, pt wore her soft brace to evaluation for comfort but reports her wrist has healed and is doing well following OT.     Limitations  Lifting;Standing;Walking;House hold activities    Currently in Pain?  No/denies          Ambulate 40 ft with RW and CGa with w/c follow. Verbal cueing for sequencing, anterior pelvic tilt, weight shift, and foot clearance. Decreasing L foot clearance due to fatigue.     Standing:   -anterior posterior pelvic tilts in standing with RW 12x, very challenging to patient    -anterior pelvic tilt with forward step, posterior pelvic tilt with backward step 10x each LE, CGA      Seated:               seated pelvic posterior anterior tilts 15x for upright posture  -2lb weight:   Circles : clockwise 15x, counterclockwise 15x straight arm   Modified skull crusher at 90 degree flexion 20x ; max tactile cueing for reducing wrist movement.     Lateral arm raise straight arm 10x      -straight arm twists 15x    -cross body knee flexion (modified isometric) opp arm raise 10x each side                  Seated LAQ modified 10x                       Seated gluteal squeezes 15x 3 second holds                TrA activation 10x 3 second holds               isometric adduction against PT hand 10x 3 second holds               Isometric abduction against PT hand 10x 3 second holds               Isometric hip flexion against PT hand 10x 3 second holds  Pt educated throughout session about proper posture and technique with  exercises. Improved exercise technique, movement at target joints, use of target muscles after min verbal, visual, tactile cues   Seated rest breaks and use of ice pack utilized to keep body temperature at functional range to reduce MS exacerbation.                      PT Education - 10/14/18 1031    Education provided  Yes    Education Details  exercise technique, stability     Person(s) Educated  Patient    Methods  Explanation;Demonstration;Tactile cues    Comprehension  Verbalized understanding;Returned demonstration;Verbal cues required;Tactile cues required       PT Short Term Goals - 10/01/18 1006      PT SHORT TERM GOAL #1   Title  Patient will be compliant with HEP 4-5 days/week for improved carryover between sessions.     Baseline  HEP compliancy between sessions    Time  2    Period  Weeks    Status  New    Target Date  10/15/18      PT SHORT TERM GOAL #2   Title  Patient will report no falls/LOB to indicate improved stability with mobility     Baseline  new    Time  2    Period  Weeks    Status  New    Target Date  10/15/18        PT Long Term Goals - 10/01/18 0001      PT LONG TERM GOAL #1   Title  Patient will perform TUG in 40 seconds or less with walker to improve safe negotiation of environment.     Baseline  5/26: 61 seconds    Time  8    Period  Weeks    Status  New    Target Date  11/26/18      PT LONG TERM GOAL #2   Title  Pt's 62mT will improve to at least 0.5 m/s for improved safety ambulating in home    Baseline  0.14 m/s; 5/15: .250m  7/10: .36 m/s  8/20: .42 m/s  9/26: 0.2634m10/14: .26 m/s improved hip and knee clearance 11/15: 0.75m4mmproved hip and knee clearance; 12/5: .29 m/s improved step length and forward moment: decreased foot drag. 05/09/2018: 0.21 m/s 2/6: .237 m/s 5/26: .23 m/s    Time  8    Period  Weeks    Status  Partially Met    Target Date  11/26/18      PT LONG TERM GOAL #3   Title  Pt's Bil hip F  strength will improve to at least 2+/5 BLEs for improved gait mechanics and safety    Baseline  L: 2/5, R: 1/5 7/10: 2/5 bilaterally  8/20: 2/5 bilat 9/26: 2/5 bilat 10/14: 2/5 bilaterally ,improved hip flexor muscle activation 2/5: improved hip flexor and quad activation: 12/5: 2/5 ; 05/09/2018: 2/5 2/6: 3/5 knee, 2 to 2+/5 hip 5/26: 2-/5 hips 3/5 knees    Time  8    Period  Weeks    Status  Partially Met    Target Date  11/26/18      PT LONG TERM GOAL #4   Title  Pt's ABC Scale will improve to at least 40% to demonstrate improved balance    Baseline  28.13% 5/15; 24.4% 7/10: 23.4% 8/20: 22. 2% 9/26: 25.3% 10/14: 26.5% 11/15: 24.38% 12/5: 26%;  05/09/2018: 29% 2/6: 23.75% 526: 30%    Time  8    Period  Weeks    Status  Partially Met    Target Date  11/26/18      PT LONG TERM GOAL #5   Title  Patient will negotiate one step ~4 inches to enter house independently to increase functional independence in natural environment    Baseline  /20: unable to do 9/26: patient too fatigued/not feeling well to perform 10/14: unable to perform at this time 11/15: unable to perform at this time; 12/5: able to lift 1 inch; 05/09/2018: unable to clear 4" step, able to lift foot ~ 1in 5/26: has been unable to perform    Time  8    Period  Weeks    Status  On-going    Target Date  11/26/18      PT LONG TERM GOAL #6   Title  Pt will be able to perform tub bench into/out of shower transfer without assist for improved independence with this task    Baseline  challenged with steps at this time, getting better, stands and gives sponge bath independently now  9/26: has not tried transfer yet 11/15: has not attempted yet; 05/09/2018: not attempted 5/26: able to do one time but needs a new shower chair    Time  8    Period  Weeks    Status  Partially Met    Target Date  11/26/18      PT LONG TERM GOAL #7   Title  Patient will stand 5 minutes to increase standing duration to allow patient to return to washing dishes and  clothes.     Baseline  12/5: 56 seconds; 05/09/2018: 1 min 18 sec 2/6: 2 minutes last session 5/26: 1 minute 36 seconds limited by breathing    Time  8    Period  Weeks    Status  Partially Met    Target Date  11/26/18      PT LONG TERM GOAL #8   Title  Patient will negotiate 30 ft of changing surfaces outside such as pavement and parking lot to allow patient to walk into church.    Baseline  12/5: does not walk outside 2/24: unable to test due to rain 5/26: unable since COVID    Time  8    Period  Weeks    Status  On-going    Target Date  11/26/18            Plan - 10/14/18 1058    Clinical Impression Statement  Patient presents with good motivation despite having more fatigued today due to weather. She has been compliant with HEP and has gotten new weights for further progression. Postural correction is improving with decreasing need for cueing at this time. Cross body coordination is challenging to patient at this time. The patient will continue to benefit from skilled therapeutic intervention to address deficits in strength, mobility, balance, and function for improved overall QOL and safety.     Rehab Potential  Good    PT Frequency  2x / week    PT Duration  8 weeks    PT Treatment/Interventions  ADLs/Self Care Home Management;Aquatic Therapy;Biofeedback;Cryotherapy;Electrical Stimulation;Iontophoresis 40m/ml Dexamethasone;Traction;Moist Heat;Ultrasound;DME Instruction;Gait training;Stair training;Functional mobility training;Therapeutic activities;Therapeutic exercise;Balance training;Neuromuscular re-education;Patient/family education;Orthotic Fit/Training;Wheelchair mobility training;Manual techniques;Compression bandaging;Passive range of motion;Dry needling;Energy conservation;Taping;Splinting    PT Next Visit Plan  strength/balance    PT Home Exercise Plan  to initiate next session (please incorporate hip  flexor and adductor stretches in HEP if possible)    Consulted and Agree  with Plan of Care  Patient       Patient will benefit from skilled therapeutic intervention in order to improve the following deficits and impairments:  Abnormal gait, Decreased activity tolerance, Decreased balance, Decreased coordination, Decreased endurance, Decreased knowledge of use of DME, Decreased mobility, Decreased range of motion, Decreased safety awareness, Decreased strength, Difficulty walking, Impaired perceived functional ability, Impaired sensation, Impaired tone, Impaired UE functional use, Improper body mechanics, Postural dysfunction  Visit Diagnosis: Muscle weakness (generalized)  Other abnormalities of gait and mobility  Unsteadiness on feet     Problem List Patient Active Problem List   Diagnosis Date Noted  . Abscess of female pelvis   . SVT (supraventricular tachycardia) ([place])   . Radial styloid tenosynovitis 03/12/2018  . Wheelchair confinement 02/27/2018  . Localized osteoporosis with current pathological fracture with routine healing 01/19/2017  . Wrist fracture 01/16/2017  . Sprain of ankle 03/23/2016  . Closed fracture of lateral malleolus 03/16/2016  . Health care maintenance 01/24/2016  . Blood pressure elevated without history of HTN 10/25/2015  . Essential hypertension 10/25/2015  . Multiple sclerosis ([place]) 10/02/2015  . Chronic left shoulder pain 07/19/2015  . Multiple sclerosis exacerbation ([place]) 07/14/2015  . MS (multiple sclerosis) ([place]) 11/26/2014  . Increased body mass index 11/26/2014  . HPV test positive 11/26/2014  . Status post laparoscopic supracervical hysterectomy 11/26/2014  . Galactorrhea 11/26/2014  . Back ache 05/21/2014  . Adiposity 05/21/2014  . Disordered sleep 05/21/2014  . Muscle spasticity 05/21/2014  . Spasticity 05/21/2014  . Calculus of kidney 12/09/2013  . Renal colic 34/48/3015  . Hypercholesteremia 08/19/2013  . Hereditary and idiopathic neuropathy 08/19/2013  . Hypercholesterolemia without  hypertriglyceridemia 08/19/2013  . Bladder infection, chronic 07/25/2012  . Disorder of bladder function 07/25/2012  . Incomplete bladder emptying 07/25/2012  . Microscopic hematuria 07/25/2012  . Right upper quadrant pain 07/25/2012   Janna Arch, PT, DPT   10/14/2018, 11:06 AM  [place] MAIN [facility] SERVICES [address] [place], [place], [address] Phone: [phone]   Fax:  [phone]  Name: Lisa Williams MRN: [id number] Date of Birth: 07-19-1960

## 2018-10-16 ENCOUNTER — Other Ambulatory Visit: Payer: Self-pay

## 2018-10-16 ENCOUNTER — Ambulatory Visit: Payer: Medicare HMO

## 2018-10-16 DIAGNOSIS — M6281 Muscle weakness (generalized): Secondary | ICD-10-CM

## 2018-10-16 DIAGNOSIS — R2689 Other abnormalities of gait and mobility: Secondary | ICD-10-CM

## 2018-10-16 DIAGNOSIS — R2681 Unsteadiness on feet: Secondary | ICD-10-CM

## 2018-10-16 NOTE — Therapy (Signed)
[place] MAIN [facility] SERVICES [address] [place], [place], [address] Phone: [phone]   Fax:  [phone]  Physical Therapy Treatment  Patient Details  Name: Lisa Williams MRN: [id number] Date of Birth: 10-23-1960 Referring Provider (PT): Gurney Maxin, MD   Encounter Date: 10/16/2018  PT End of Session - 10/16/18 1013    Visit Number  25    Number of Visits  99    Date for PT Re-Evaluation  11/26/18    Authorization Type  6/10 starting 5/26)     PT Start Time  1015    PT Stop Time  1059    PT Time Calculation (min)  44 min    Equipment Utilized During Treatment  Gait belt    Activity Tolerance  Patient tolerated treatment well;Other (comment)   mask is limiting   Behavior During Therapy  WFL for tasks assessed/performed       Past Medical History:  Diagnosis Date  . Abdominal pain, right upper quadrant   . Back pain   . Calculus of kidney 12/09/2013  . Chronic back pain    unspecified  . Chronic left shoulder pain 07/19/2015  . Functional disorder of bladder    other  . Galactorrhea 11/26/2014   Chronic   . Hereditary and idiopathic neuropathy 08/19/2013  . HPV test positive   . Hypercholesteremia 08/19/2013  . Incomplete bladder emptying   . Microscopic hematuria   . MS (multiple sclerosis) ([place])   . Muscle spasticity 05/21/2014  . Nonspecific findings on examination of urine    other  . Osteopenia   . Status post laparoscopic supracervical hysterectomy 11/26/2014  . Tobacco user 11/26/2014  . Wrist fracture     Past Surgical History:  Procedure Laterality Date  . bilateral tubal ligation  1996  . BREAST CYST EXCISION Left 2002  . KNEE SURGERY     right  . LAPAROSCOPIC SUPRACERVICAL HYSTERECTOMY  08/05/2013  . ORIF WRIST FRACTURE Left 01/17/2017   Procedure: OPEN REDUCTION INTERNAL FIXATION (ORIF) WRIST FRACTURE;  Surgeon: Lovell Sheehan, MD;  Location: [facility] ORS;  Service: Orthopedics;  Laterality: Left;   . TUBAL LIGATION Bilateral   . VAGINAL HYSTERECTOMY  03/2006    There were no vitals filed for this visit.  Subjective Assessment - 10/16/18 1048    Subjective  Patient reports compliance with HEP, has been using her weights at home. No falls or LOB since last session.     Patient is accompained by:  Family member    Pertinent History  Pt is a 58 y/o F who presents with BLE weakness and difficulty ambulating due to h/o MS that was diagnosed in 1995. Pt denies any pain associated. Pt was admitted to the hospital in November 2018 due to MS exacerbation. Nov-Dec to HHPT-->[facility] until end of Jan. After rehab the pt was able to ambulate up to 100 ft with the RW. Pt has Bil AFO, her therapist at [place] thinks she needs a new AFO as her feet still drag when she ambulates, pt reports HHPT mentioned a spring AFO. Pt has custom AFOs that were originally made 4 years ago. Goals: improve balance, to be able to get into the shower with her tub bench without assist, to be able to get up 2 steps at sister's home with railings on both sides but too far apart to reach both sides, to be able to drive adapted car. Pt is staying at other sister's house with one  step to get inside and currently picking up L leg with UEs and then stepping in. Pt steps down leading with LLE. Pt wants to work on walking side to side as this is challenging and she had not yet progressed to this at rehab. Pt able to get in/out the car with assist only to bring BLEs into the car. Hoping to drive by June or July with adapted car (already has one). Pt needs assist bringing LEs into and out of hospital bed. Pt able to perform sit>stand without assist from [facility] with a pillow on the seat for a boost. Pt denies any falls in the past 6 months. Pt has been increasing her time alone at home up to a few hours at a time to become more independent. Pt has both manual and power WC. Uses power WC at home, uses manual WC out in community. Pt able to  self propel manual WC. Pt needs assist putting pants around ankles but she can pull them up. Pt can put on her shoes but needs help getting her leg up to do so. Pt able to tie her shoelaces on her own. Pt has a tub/shower unit at home with a tub bench. No grab bars in the shower. Has hand held shower head. Pt currently taking a sponge bath. Pt has a regular height toilet with a BSC over top. Pt with h/o L wrist fx with no precautions, pt wore her soft brace to evaluation for comfort but reports her wrist has healed and is doing well following OT.     Limitations  Lifting;Standing;Walking;House hold activities    Currently in Pain?  No/denies           Standing:   -lateral weight shift 60 seconds, fatigue with prolonged standing with lateral movements.   -anterior posterior pelvic tilts in standing with RW 12x, very challenging to patient   -RW single hand raises 10x each LE  -RW saebo transfer with SUE support with CGA, resting breaks with increasing UE range maintaining functional range. Able to reach upper level with one ball x 8 minutes : seated rest break between switching hands.      Seated:          3000 Gr weighted ball:                          -ball toss to varying planes of movement 60 seconds                         -chest press 15x                         -large diameter circle, slow circles 10x each direction                         -chest to head upright press 15x                -scapular retractions 15x 3 second holds               Seated LAQ modified 12x              Seated gluteal squeezes 15x 3 second holds          Pt educated throughout session about proper posture and technique with exercises. Improved exercise technique, movement at target joints, use of target muscles  after min verbal, visual, tactile cues   Seated rest breaks and use of ice pack utilized to keep body temperature at functional range to reduce MS  exacerbation.                        PT Education - 10/16/18 1013    Education provided  Yes    Education Details  exercise technique, stability     Person(s) Educated  Patient    Methods  Explanation;Demonstration;Tactile cues;Verbal cues    Comprehension  Verbalized understanding;Returned demonstration;Verbal cues required;Tactile cues required       PT Short Term Goals - 10/01/18 1006      PT SHORT TERM GOAL #1   Title  Patient will be compliant with HEP 4-5 days/week for improved carryover between sessions.     Baseline  HEP compliancy between sessions    Time  2    Period  Weeks    Status  New    Target Date  10/15/18      PT SHORT TERM GOAL #2   Title  Patient will report no falls/LOB to indicate improved stability with mobility     Baseline  new    Time  2    Period  Weeks    Status  New    Target Date  10/15/18        PT Long Term Goals - 10/01/18 0001      PT LONG TERM GOAL #1   Title  Patient will perform TUG in 40 seconds or less with walker to improve safe negotiation of environment.     Baseline  5/26: 61 seconds    Time  8    Period  Weeks    Status  New    Target Date  11/26/18      PT LONG TERM GOAL #2   Title  Pt's 74mT will improve to at least 0.5 m/s for improved safety ambulating in home    Baseline  0.14 m/s; 5/15: .245m  7/10: .36 m/s  8/20: .42 m/s  9/26: 0.2616m10/14: .26 m/s improved hip and knee clearance 11/15: 0.86m85mmproved hip and knee clearance; 12/5: .29 m/s improved step length and forward moment: decreased foot drag. 05/09/2018: 0.21 m/s 2/6: .237 m/s 5/26: .23 m/s    Time  8    Period  Weeks    Status  Partially Met    Target Date  11/26/18      PT LONG TERM GOAL #3   Title  Pt's Bil hip F strength will improve to at least 2+/5 BLEs for improved gait mechanics and safety    Baseline  L: 2/5, R: 1/5 7/10: 2/5 bilaterally  8/20: 2/5 bilat 9/26: 2/5 bilat 10/14: 2/5 bilaterally ,improved hip flexor muscle  activation 2/5: improved hip flexor and quad activation: 12/5: 2/5 ; 05/09/2018: 2/5 2/6: 3/5 knee, 2 to 2+/5 hip 5/26: 2-/5 hips 3/5 knees    Time  8    Period  Weeks    Status  Partially Met    Target Date  11/26/18      PT LONG TERM GOAL #4   Title  Pt's ABC Scale will improve to at least 40% to demonstrate improved balance    Baseline  28.13% 5/15; 24.4% 7/10: 23.4% 8/20: 22. 2% 9/26: 25.3% 10/14: 26.5% 11/15: 24.38% 12/5: 26%; 05/09/2018: 29% 2/6: 23.75% 526: 30%    Time  8    Period  Weeks  Status  Partially Met    Target Date  11/26/18      PT LONG TERM GOAL #5   Title  Patient will negotiate one step ~4 inches to enter house independently to increase functional independence in natural environment    Baseline  /20: unable to do 9/26: patient too fatigued/not feeling well to perform 10/14: unable to perform at this time 11/15: unable to perform at this time; 12/5: able to lift 1 inch; 05/09/2018: unable to clear 4" step, able to lift foot ~ 1in 5/26: has been unable to perform    Time  8    Period  Weeks    Status  On-going    Target Date  11/26/18      PT LONG TERM GOAL #6   Title  Pt will be able to perform tub bench into/out of shower transfer without assist for improved independence with this task    Baseline  challenged with steps at this time, getting better, stands and gives sponge bath independently now  9/26: has not tried transfer yet 11/15: has not attempted yet; 05/09/2018: not attempted 5/26: able to do one time but needs a new shower chair    Time  8    Period  Weeks    Status  Partially Met    Target Date  11/26/18      PT LONG TERM GOAL #7   Title  Patient will stand 5 minutes to increase standing duration to allow patient to return to washing dishes and clothes.     Baseline  12/5: 56 seconds; 05/09/2018: 1 min 18 sec 2/6: 2 minutes last session 5/26: 1 minute 36 seconds limited by breathing    Time  8    Period  Weeks    Status  Partially Met    Target Date   11/26/18      PT LONG TERM GOAL #8   Title  Patient will negotiate 30 ft of changing surfaces outside such as pavement and parking lot to allow patient to walk into church.    Baseline  12/5: does not walk outside 2/24: unable to test due to rain 5/26: unable since COVID    Time  8    Period  Weeks    Status  On-going    Target Date  11/26/18            Plan - 10/16/18 1102    Clinical Impression Statement  Patient demonstrates progression of balance with ability to stabilize with SUE support while reaching with opp UE for functional mobility. Patient able to stand with increasing duration for dynamic stability with decreasing fatigue. Patient fatigued by end of session resulting in decreased amplitude of movement of LAQ. The patient will continue to benefit from skilled therapeutic intervention to address deficits in strength, mobility, balance, and function for improved overall QOL and safety.     Rehab Potential  Good    PT Frequency  2x / week    PT Duration  8 weeks    PT Treatment/Interventions  ADLs/Self Care Home Management;Aquatic Therapy;Biofeedback;Cryotherapy;Electrical Stimulation;Iontophoresis 70m/ml Dexamethasone;Traction;Moist Heat;Ultrasound;DME Instruction;Gait training;Stair training;Functional mobility training;Therapeutic activities;Therapeutic exercise;Balance training;Neuromuscular re-education;Patient/family education;Orthotic Fit/Training;Wheelchair mobility training;Manual techniques;Compression bandaging;Passive range of motion;Dry needling;Energy conservation;Taping;Splinting    PT Next Visit Plan  strength/balance    PT Home Exercise Plan  to initiate next session (please incorporate hip flexor and adductor stretches in HEP if possible)    Consulted and Agree with Plan of Care  Patient  Patient will benefit from skilled therapeutic intervention in order to improve the following deficits and impairments:  Abnormal gait, Decreased activity tolerance,  Decreased balance, Decreased coordination, Decreased endurance, Decreased knowledge of use of DME, Decreased mobility, Decreased range of motion, Decreased safety awareness, Decreased strength, Difficulty walking, Impaired perceived functional ability, Impaired sensation, Impaired tone, Impaired UE functional use, Improper body mechanics, Postural dysfunction  Visit Diagnosis: Muscle weakness (generalized)  Other abnormalities of gait and mobility  Unsteadiness on feet     Problem List Patient Active Problem List   Diagnosis Date Noted  . Abscess of female pelvis   . SVT (supraventricular tachycardia) ([place])   . Radial styloid tenosynovitis 03/12/2018  . Wheelchair confinement 02/27/2018  . Localized osteoporosis with current pathological fracture with routine healing 01/19/2017  . Wrist fracture 01/16/2017  . Sprain of ankle 03/23/2016  . Closed fracture of lateral malleolus 03/16/2016  . Health care maintenance 01/24/2016  . Blood pressure elevated without history of HTN 10/25/2015  . Essential hypertension 10/25/2015  . Multiple sclerosis ([place]) 10/02/2015  . Chronic left shoulder pain 07/19/2015  . Multiple sclerosis exacerbation ([place]) 07/14/2015  . MS (multiple sclerosis) ([place]) 11/26/2014  . Increased body mass index 11/26/2014  . HPV test positive 11/26/2014  . Status post laparoscopic supracervical hysterectomy 11/26/2014  . Galactorrhea 11/26/2014  . Back ache 05/21/2014  . Adiposity 05/21/2014  . Disordered sleep 05/21/2014  . Muscle spasticity 05/21/2014  . Spasticity 05/21/2014  . Calculus of kidney 12/09/2013  . Renal colic 21/97/5883  . Hypercholesteremia 08/19/2013  . Hereditary and idiopathic neuropathy 08/19/2013  . Hypercholesterolemia without hypertriglyceridemia 08/19/2013  . Bladder infection, chronic 07/25/2012  . Disorder of bladder function 07/25/2012  . Incomplete bladder emptying 07/25/2012  . Microscopic hematuria 07/25/2012  . Right upper  quadrant pain 07/25/2012   Janna Arch, PT, DPT   10/16/2018, 11:03 AM  [place] MAIN [facility] SERVICES [address] [place], [place], [address] Phone: [phone]   Fax:  [phone]  Name: Jaiyla Granados Prater MRN: [id number] Date of Birth: [birth year]

## 2018-10-21 ENCOUNTER — Ambulatory Visit: Payer: Medicare HMO

## 2018-10-21 ENCOUNTER — Other Ambulatory Visit: Payer: Self-pay

## 2018-10-21 DIAGNOSIS — R2689 Other abnormalities of gait and mobility: Secondary | ICD-10-CM

## 2018-10-21 DIAGNOSIS — M6281 Muscle weakness (generalized): Secondary | ICD-10-CM | POA: Diagnosis not present

## 2018-10-21 DIAGNOSIS — R2681 Unsteadiness on feet: Secondary | ICD-10-CM

## 2018-10-21 NOTE — Therapy (Signed)
[facility] [address] [place], [place], [address] Phone: [phone]   Fax:  [phone]  Physical Therapy Treatment  Patient Details  Name: Lisa Williams MRN: [id number] Date of Birth: 1961-02-03 Referring Provider (PT): Gurney Maxin, MD   Encounter Date: 10/21/2018  PT End of Session - 10/21/18 1037    Visit Number  53    Number of Visits  99    Date for PT Re-Evaluation  11/26/18    Authorization Type  7/10 starting 5/26)    PT Start Time  1015    PT Stop Time  1059    PT Time Calculation (min)  44 min    Equipment Utilized During Treatment  Gait belt    Activity Tolerance  Patient tolerated treatment well;Other (comment)   mask is limiting   Behavior During Therapy  WFL for tasks assessed/performed       Past Medical History:  Diagnosis Date  . Abdominal pain, right upper quadrant   . Back pain   . Calculus of kidney 12/09/2013  . Chronic back pain    unspecified  . Chronic left shoulder pain 07/19/2015  . Functional disorder of bladder    other  . Galactorrhea 11/26/2014   Chronic   . Hereditary and idiopathic neuropathy 08/19/2013  . HPV test positive   . Hypercholesteremia 08/19/2013  . Incomplete bladder emptying   . Microscopic hematuria   . MS (multiple sclerosis) ([place])   . Muscle spasticity 05/21/2014  . Nonspecific findings on examination of urine    other  . Osteopenia   . Status post laparoscopic supracervical hysterectomy 11/26/2014  . Tobacco user 11/26/2014  . Wrist fracture     Past Surgical History:  Procedure Laterality Date  . bilateral tubal ligation  1996  . BREAST CYST EXCISION Left 2002  . KNEE SURGERY     right  . LAPAROSCOPIC SUPRACERVICAL HYSTERECTOMY  08/05/2013  . ORIF WRIST FRACTURE Left 01/17/2017   Procedure: OPEN REDUCTION INTERNAL FIXATION (ORIF) WRIST FRACTURE;  Surgeon: Lovell Sheehan, MD;  Location: [facility] ORS;  Service: Orthopedics;  Laterality: Left;  .  TUBAL LIGATION Bilateral   . VAGINAL HYSTERECTOMY  03/2006    There were no vitals filed for this visit.  Subjective Assessment - 10/21/18 1034    Subjective  Patient reports she had a good birthday. Has been having lateral side pains/cramps. No falls or LOB since last session.    Patient is accompained by:  Family member    Pertinent History  Pt is a 58 y/o F who presents with BLE weakness and difficulty ambulating due to h/o MS that was diagnosed in 1995. Pt denies any pain associated. Pt was admitted to the hospital in November 2018 due to MS exacerbation. Nov-Dec to HHPT-->[facility] until end of Jan. After rehab the pt was able to ambulate up to 100 ft with the RW. Pt has Bil AFO, her therapist at [facility] thinks she needs a new AFO as her feet still drag when she ambulates, pt reports HHPT mentioned a spring AFO. Pt has custom AFOs that were originally made 4 years ago. Goals: improve balance, to be able to get into the shower with her tub bench without assist, to be able to get up 2 steps at sister's home with railings on both sides but too far apart to reach both sides, to be able to drive adapted car. Pt is staying at other sister's house with one step  to get inside and currently picking up L leg with UEs and then stepping in. Pt steps down leading with LLE. Pt wants to work on walking side to side as this is challenging and she had not yet progressed to this at rehab. Pt able to get in/out the car with assist only to bring BLEs into the car. Hoping to drive by June or July with adapted car (already has one). Pt needs assist bringing LEs into and out of hospital bed. Pt able to perform sit>stand without assist from [facility] with a pillow on the seat for a boost. Pt denies any falls in the past 6 months. Pt has been increasing her time alone at home up to a few hours at a time to become more independent. Pt has both manual and power WC. Uses power WC at home, uses manual WC out in community. Pt able  to self propel manual WC. Pt needs assist putting pants around ankles but she can pull them up. Pt can put on her shoes but needs help getting her leg up to do so. Pt able to tie her shoelaces on her own. Pt has a tub/shower unit at home with a tub bench. No grab bars in the shower. Has hand held shower head. Pt currently taking a sponge bath. Pt has a regular height toilet with a BSC over top. Pt with h/o L wrist fx with no precautions, pt wore her soft brace to evaluation for comfort but reports her wrist has healed and is doing well following OT.     Limitations  Lifting;Standing;Walking;House hold activities    Currently in Pain?  Yes    Pain Score  2     Pain Location  Flank    Pain Orientation  Right;Left    Pain Descriptors / Indicators  Aching;Stabbing    Pain Type  Chronic pain    Pain Onset  In the past 7 days    Pain Frequency  Intermittent        Standing:   -lateral weight shift 60 seconds, fatigue with prolonged standing with lateral movements.    -anterior posterior pelvic tilts in standing with RW 12x, very challenging to patient   -RW single hand raises 10x each LE       Seated:           3000 Gr weighted ball:                          -ball toss to varying planes of movement 60 seconds                         -chest press 15x                         -large diameter circle, slow circles 10x each direction                         -chest to head upright press 15x                -scapular retractions 15x 3 second holds               Seated LAQ modified 12x              Seated gluteal squeezes 15x 3 second holds    Seated adduction/abduction AROM modified 15x  Lateral side bends for trunk muscle tissue lengthening: straight arm and bent arm 2 minutes   Swiss ball: lateral/diagonal direction 10x each direction, forward 10x. Education on performance at home on kitchen table with towel. Rest between each stretch due to lightheadedness        Pt educated  throughout session about proper posture and technique with exercises. Improved exercise technique, movement at target joints, use of target muscles after min verbal, visual, tactile cues   Seated rest breaks and use of ice pack utilized to keep body temperature at functional range to reduce MS exacerbation  Patient has increased episodes of lateral side pain that improved with exercise technique. Patient educated on HEP for home performance when side pains occur with patient demonstrating understanding. Patient fatigued quickly today requiring more seated interventions. The patient will continue to benefit from skilled therapeutic intervention to address deficits in strength, mobility, balance, and function for improved overall QOL and safety.                         PT Education - 10/21/18 1037    Education provided  Yes    Education Details  exercise technique, pain reduction    Person(s) Educated  Patient    Methods  Explanation;Demonstration;Tactile cues;Verbal cues    Comprehension  Verbalized understanding;Returned demonstration;Verbal cues required;Tactile cues required;Need further instruction       PT Short Term Goals - 10/01/18 1006      PT SHORT TERM GOAL #1   Title  Patient will be compliant with HEP 4-5 days/week for improved carryover between sessions.     Baseline  HEP compliancy between sessions    Time  2    Period  Weeks    Status  New    Target Date  10/15/18      PT SHORT TERM GOAL #2   Title  Patient will report no falls/LOB to indicate improved stability with mobility     Baseline  new    Time  2    Period  Weeks    Status  New    Target Date  10/15/18        PT Long Term Goals - 10/01/18 0001      PT LONG TERM GOAL #1   Title  Patient will perform TUG in 40 seconds or less with walker to improve safe negotiation of environment.     Baseline  5/26: 61 seconds    Time  8    Period  Weeks    Status  New    Target Date  11/26/18       PT LONG TERM GOAL #2   Title  Pt's 76mT will improve to at least 0.5 m/s for improved safety ambulating in home    Baseline  0.14 m/s; 5/15: .254m  7/10: .36 m/s  8/20: .42 m/s  9/26: 0.2631m10/14: .26 m/s improved hip and knee clearance 11/15: 0.39m44mmproved hip and knee clearance; 12/5: .29 m/s improved step length and forward moment: decreased foot drag. 05/09/2018: 0.21 m/s 2/6: .237 m/s 5/26: .23 m/s    Time  8    Period  Weeks    Status  Partially Met    Target Date  11/26/18      PT LONG TERM GOAL #3   Title  Pt's Bil hip F strength will improve to at least 2+/5 BLEs for improved gait mechanics and safety    Baseline  L: 2/5,  R: 1/5 7/10: 2/5 bilaterally  8/20: 2/5 bilat 9/26: 2/5 bilat 10/14: 2/5 bilaterally ,improved hip flexor muscle activation 2/5: improved hip flexor and quad activation: 12/5: 2/5 ; 05/09/2018: 2/5 2/6: 3/5 knee, 2 to 2+/5 hip 5/26: 2-/5 hips 3/5 knees    Time  8    Period  Weeks    Status  Partially Met    Target Date  11/26/18      PT LONG TERM GOAL #4   Title  Pt's ABC Scale will improve to at least 40% to demonstrate improved balance    Baseline  28.13% 5/15; 24.4% 7/10: 23.4% 8/20: 22. 2% 9/26: 25.3% 10/14: 26.5% 11/15: 24.38% 12/5: 26%; 05/09/2018: 29% 2/6: 23.75% 526: 30%    Time  8    Period  Weeks    Status  Partially Met    Target Date  11/26/18      PT LONG TERM GOAL #5   Title  Patient will negotiate one step ~4 inches to enter house independently to increase functional independence in natural environment    Baseline  /20: unable to do 9/26: patient too fatigued/not feeling well to perform 10/14: unable to perform at this time 11/15: unable to perform at this time; 12/5: able to lift 1 inch; 05/09/2018: unable to clear 4" step, able to lift foot ~ 1in 5/26: has been unable to perform    Time  8    Period  Weeks    Status  On-going    Target Date  11/26/18      PT LONG TERM GOAL #6   Title  Pt will be able to perform tub bench into/out of  shower transfer without assist for improved independence with this task    Baseline  challenged with steps at this time, getting better, stands and gives sponge bath independently now  9/26: has not tried transfer yet 11/15: has not attempted yet; 05/09/2018: not attempted 5/26: able to do one time but needs a new shower chair    Time  8    Period  Weeks    Status  Partially Met    Target Date  11/26/18      PT LONG TERM GOAL #7   Title  Patient will stand 5 minutes to increase standing duration to allow patient to return to washing dishes and clothes.     Baseline  12/5: 56 seconds; 05/09/2018: 1 min 18 sec 2/6: 2 minutes last session 5/26: 1 minute 36 seconds limited by breathing    Time  8    Period  Weeks    Status  Partially Met    Target Date  11/26/18      PT LONG TERM GOAL #8   Title  Patient will negotiate 30 ft of changing surfaces outside such as pavement and parking lot to allow patient to walk into church.    Baseline  12/5: does not walk outside 2/24: unable to test due to rain 5/26: unable since COVID    Time  8    Period  Weeks    Status  On-going    Target Date  11/26/18            Plan - 10/21/18 1050    Clinical Impression Statement  Patient has increased episodes of lateral side pain that improved with exercise technique. Patient educated on HEP for home performance when side pains occur with patient demonstrating understanding. Patient fatigued quickly today requiring more seated interventions. The patient will continue  to benefit from skilled therapeutic intervention to address deficits in strength, mobility, balance, and function for improved overall QOL and safety.    Rehab Potential  Good    PT Frequency  2x / week    PT Duration  8 weeks    PT Treatment/Interventions  ADLs/Self Care Home Management;Aquatic Therapy;Biofeedback;Cryotherapy;Electrical Stimulation;Iontophoresis 14m/ml Dexamethasone;Traction;Moist Heat;Ultrasound;DME Instruction;Gait training;Stair  training;Functional mobility training;Therapeutic activities;Therapeutic exercise;Balance training;Neuromuscular re-education;Patient/family education;Orthotic Fit/Training;Wheelchair mobility training;Manual techniques;Compression bandaging;Passive range of motion;Dry needling;Energy conservation;Taping;Splinting    PT Next Visit Plan  strength/balance    PT Home Exercise Plan  to initiate next session (please incorporate hip flexor and adductor stretches in HEP if possible)    Consulted and Agree with Plan of Care  Patient       Patient will benefit from skilled therapeutic intervention in order to improve the following deficits and impairments:  Abnormal gait, Decreased activity tolerance, Decreased balance, Decreased coordination, Decreased endurance, Decreased knowledge of use of DME, Decreased mobility, Decreased range of motion, Decreased safety awareness, Decreased strength, Difficulty walking, Impaired perceived functional ability, Impaired sensation, Impaired tone, Impaired UE functional use, Improper body mechanics, Postural dysfunction  Visit Diagnosis: Muscle weakness (generalized) - Plan:  Other abnormalities of gait and mobility - Plan:   Unsteadiness on feet - Plan:     Problem List Patient Active Problem List   Diagnosis Date Noted  . Abscess of female pelvis   . SVT (supra[place]ricular tachycardia) ([place]   . Radial styloid tenosynovitis 03/12/2018  . Wheelchair confinement 02/27/2018  . Localized osteoporosis with current pathological fracture with routine healing 01/19/2017  . Wrist fracture 01/16/2017  . Sprain of ankle 03/23/2016  . Closed fracture of lateral malleolus 03/16/2016  . Health care maintenance 01/24/2016  . Blood pressure elevated without history of HTN 10/25/2015  . Essential hypertension 10/25/2015  . Multiple sclerosis ([place] 10/02/2015  . Chronic left shoulder pain 07/19/2015  . Multiple sclerosis exacerbation ([place] 07/14/2015  . MS (multiple  sclerosis) ([place] 11/26/2014  . Increased body mass index 11/26/2014  . HPV test positive 11/26/2014  . Status post laparoscopic supracervical hysterectomy 11/26/2014  . Galactorrhea 11/26/2014  . Back ache 05/21/2014  . Adiposity 05/21/2014  . Disordered sleep 05/21/2014  . Muscle spasticity 05/21/2014  . Spasticity 05/21/2014  . Calculus of kidney 12/09/2013  . Renal colic 059/[id number]/7185 . Hypercholesteremia 08/19/2013  . Hereditary and idiopathic neuropathy 08/19/2013  . Hypercholesterolemia without hypertriglyceridemia 08/19/2013  . Bladder infection, chronic 07/25/2012  . Disorder of bladder function 07/25/2012  . Incomplete bladder emptying 07/25/2012  . Microscopic hematuria 07/25/2012  . Right upper quadrant pain 07/25/2012   MJanna Arch PT, DPT   10/21/2018, 11:10 AM  [facility] [address] [address]Phone: [phone]  Fax:  [phone] Name: Lisa Williams MRN: [id number] of Birth: [birth year]

## 2018-10-23 ENCOUNTER — Ambulatory Visit: Payer: Medicare HMO

## 2018-10-23 ENCOUNTER — Other Ambulatory Visit: Payer: Self-pay

## 2018-10-23 DIAGNOSIS — M6281 Muscle weakness (generalized): Secondary | ICD-10-CM

## 2018-10-23 DIAGNOSIS — R2689 Other abnormalities of gait and mobility: Secondary | ICD-10-CM

## 2018-10-23 DIAGNOSIS — R2681 Unsteadiness on feet: Secondary | ICD-10-CM

## 2018-10-23 NOTE — Therapy (Signed)
[place] MAIN [facility] SERVICES [place], [place], [address] Phone: [phone]   Fax:  [phone]  Physical Therapy Treatment/Physical Therapy Progress Note   Dates of reporting period  10/01/18   to   10/23/18   Patient Details  Name: Lisa Williams MRN: [id number] Date of Birth: 04/30/1961 Referring Provider (PT): Gurney Maxin, MD   Encounter Date: 10/23/2018  PT End of Session - 10/23/18 1010    Visit Number  90    Number of Visits  99    Date for PT Re-Evaluation  11/26/18    PT Start Time  7341    PT Stop Time  1100    PT Time Calculation (min)  46 min    Equipment Utilized During Treatment  Gait belt    Activity Tolerance  Patient tolerated treatment well;Other (comment)    Behavior During Therapy  WFL for tasks assessed/performed       Past Medical History:  Diagnosis Date  . Abdominal pain, right upper quadrant   . Back pain   . Calculus of kidney 12/09/2013  . Chronic back pain    unspecified  . Chronic left shoulder pain 07/19/2015  . Functional disorder of bladder    other  . Galactorrhea 11/26/2014   Chronic   . Hereditary and idiopathic neuropathy 08/19/2013  . HPV test positive   . Hypercholesteremia 08/19/2013  . Incomplete bladder emptying   . Microscopic hematuria   . MS (multiple sclerosis) ([place])   . Muscle spasticity 05/21/2014  . Nonspecific findings on examination of urine    other  . Osteopenia   . Status post laparoscopic supracervical hysterectomy 11/26/2014  . Tobacco user 11/26/2014  . Wrist fracture     Past Surgical History:  Procedure Laterality Date  . bilateral tubal ligation  1996  . BREAST CYST EXCISION Left 2002  . KNEE SURGERY     right  . LAPAROSCOPIC SUPRACERVICAL HYSTERECTOMY  08/05/2013  . ORIF WRIST FRACTURE Left 01/17/2017   Procedure: OPEN REDUCTION INTERNAL FIXATION (ORIF) WRIST FRACTURE;  Surgeon: Lovell Sheehan, MD;  Location: [facility] ORS;  Service:  Orthopedics;  Laterality: Left;  . TUBAL LIGATION Bilateral   . VAGINAL HYSTERECTOMY  03/2006    There were no vitals filed for this visit.  Subjective Assessment - 10/23/18 1037    Subjective  Patient rports he side cramp continues to affect her limiting her standing. No falls or LOB since last session. Reports compliance with HEP    Patient is accompained by:  Family member    Pertinent History  Pt is a 58 y/o F who presents with BLE weakness and difficulty ambulating due to h/o MS that was diagnosed in 1995. Pt denies any pain associated. Pt was admitted to the hospital in November 2018 due to MS exacerbation. Nov-Dec to HHPT-->[facility] until end of Jan. After rehab the pt was able to ambulate up to 100 ft with the RW. Pt has Bil AFO, her therapist at [facility] thinks she needs a new AFO as her feet still drag when she ambulates, pt reports HHPT mentioned a spring AFO. Pt has custom AFOs that were originally made 4 years ago. Goals: improve balance, to be able to get into the shower with her tub bench without assist, to be able to get up 2 steps at sister's home with railings on both sides but too far apart to reach both sides, to be able to drive adapted car. Pt is  staying at other sister's house with one step to get inside and currently picking up L leg with UEs and then stepping in. Pt steps down leading with LLE. Pt wants to work on walking side to side as this is challenging and she had not yet progressed to this at rehab. Pt able to get in/out the car with assist only to bring BLEs into the car. Hoping to drive by June or July with adapted car (already has one). Pt needs assist bringing LEs into and out of hospital bed. Pt able to perform sit>stand without assist from [facility] with a pillow on the seat for a boost. Pt denies any falls in the past 6 months. Pt has been increasing her time alone at home up to a few hours at a time to become more independent. Pt has both manual and power WC. Uses  power WC at home, uses manual WC out in community. Pt able to self propel manual WC. Pt needs assist putting pants around ankles but she can pull them up. Pt can put on her shoes but needs help getting her leg up to do so. Pt able to tie her shoelaces on her own. Pt has a tub/shower unit at home with a tub bench. No grab bars in the shower. Has hand held shower head. Pt currently taking a sponge bath. Pt has a regular height toilet with a BSC over top. Pt with h/o L wrist fx with no precautions, pt wore her soft brace to evaluation for comfort but reports her wrist has healed and is doing well following OT.     Limitations  Lifting;Standing;Walking;House hold activities    Currently in Pain?  Yes    Pain Score  3     Pain Location  Flank    Pain Orientation  Right    Pain Descriptors / Indicators  Aching;Stabbing    Pain Type  Acute pain    Pain Onset  In the past 7 days    Pain Frequency  Intermittent       Goals:  TUG: 60 seconds No falls 10 MWT: .24 m/s with wet feet  ABC: 26.8% Small step  Stand 5 minutes- terminated due to sharp abdominal pain   Treatment  downslip rib of R side, upward mobilizations grade II-III decrease pain and radiation of symptoms -coverroll and leukotape utilized to tape rib to reinforce alignment into neutral position. Patient reports relief s/p mobilizations and tabping               Swiss ball: lateral/diagonal direction 10x each direction, forward 10x. Education on performance at home on kitchen table with towel. Rest between each stretch due to lightheadedness     RW with forward/backward step 8x each LE, cueing for upright position, 80% clearance of foot with step, Decreased clearance of LLE compared to RLE however continues to improve   Scapular retractions with upright posture and coordination of abdominal activation with retraction for neutral spinal alignment  Modified LAQ 10x each LE  Seated "scarecrow" with arms at 90 90 for medial deltoid  activation to increase smoothness with transfers.       Patient's condition has the potential to improve in response to therapy. Maximum improvement is yet to be obtained. The anticipated improvement is attainable and reasonable in a generally predictable time.  Patient reports she has noticed her ambulation has continued to improve as well as her ability to lift her LE's for improved gait with less scuffing of her  feet.                  PT Education - 10/23/18 1010    Education provided  Yes    Education Details  goals, progress of POC, exercise technique    Person(s) Educated  Patient    Methods  Explanation;Demonstration;Tactile cues;Verbal cues    Comprehension  Verbalized understanding;Returned demonstration;Verbal cues required;Tactile cues required;Need further instruction       PT Short Term Goals - 10/23/18 1111      PT SHORT TERM GOAL #1   Title  Patient will be compliant with HEP 4-5 days/week for improved carryover between sessions.     Baseline  HEP compliancy between sessions 6/17 compliant    Time  2    Period  Weeks    Status  Achieved    Target Date  10/15/18      PT SHORT TERM GOAL #2   Title  Patient will report no falls/LOB to indicate improved stability with mobility     Baseline  6/17: no falls, one episode of instability    Time  2    Period  Weeks    Status  Partially Met    Target Date  11/06/18        PT Long Term Goals - 10/23/18 0001      PT LONG TERM GOAL #1   Title  Patient will perform TUG in 40 seconds or less with walker to improve safe negotiation of environment.     Baseline  5/26: 61 seconds 6/17: 60 seconds    Time  8    Period  Weeks    Status  Partially Met    Target Date  11/26/18      PT LONG TERM GOAL #2   Title  Pt's 44mT will improve to at least 0.5 m/s for improved safety ambulating in home    Baseline  0.14 m/s; 5/15: .261m  7/10: .36 m/s  8/20: .42 m/s  9/26: 0.269m10/14: .26 m/s improved hip and knee  clearance 11/15: 0.21m11mmproved hip and knee clearance; 12/5: .29 m/s improved step length and forward moment: decreased foot drag. 05/09/2018: 0.21 m/s 2/6: .237 m/s 5/26: .23 m/s 6/17: .24 m/s     Time  8    Period  Weeks    Status  Partially Met    Target Date  11/26/18      PT LONG TERM GOAL #3   Title  Pt's Bil hip F strength will improve to at least 2+/5 BLEs for improved gait mechanics and safety    Baseline  L: 2/5, R: 1/5 7/10: 2/5 bilaterally  8/20: 2/5 bilat 9/26: 2/5 bilat 10/14: 2/5 bilaterally ,improved hip flexor muscle activation 2/5: improved hip flexor and quad activation: 12/5: 2/5 ; 05/09/2018: 2/5 2/6: 3/5 knee, 2 to 2+/5 hip 5/26: 2-/5 hips 3/5 knees 6/17: 2-/5 hips 3/5 knees    Time  8    Period  Weeks    Status  Partially Met    Target Date  11/26/18      PT LONG TERM GOAL #4   Title  Pt's ABC Scale will improve to at least 40% to demonstrate improved balance    Baseline  28.13% 5/15; 24.4% 7/10: 23.4% 8/20: 22. 2% 9/26: 25.3% 10/14: 26.5% 11/15: 24.38% 12/5: 26%; 05/09/2018: 29% 2/6: 23.75% 526: 30% 6/17: 26.8%     Time  8    Period  Weeks    Status  Partially  Met    Target Date  11/26/18      PT LONG TERM GOAL #5   Title  Patient will negotiate one step ~4 inches to enter house independently to increase functional independence in natural environment    Baseline  /20: unable to do 9/26: patient too fatigued/not feeling well to perform 10/14: unable to perform at this time 11/15: unable to perform at this time; 12/5: able to lift 1 inch; 05/09/2018: unable to clear 4" step, able to lift foot ~ 1in 5/26: has been unable to perform 6/17: able to lift foot 1.25 inch    Time  8    Period  Weeks    Status  On-going    Target Date  11/26/18      PT LONG TERM GOAL #6   Title  Pt will be able to perform tub bench into/out of shower transfer without assist for improved independence with this task    Baseline  challenged with steps at this time, getting better, stands and  gives sponge bath independently now  9/26: has not tried transfer yet 11/15: has not attempted yet; 05/09/2018: not attempted 5/26: able to do one time but needs a new shower chair    Time  8    Period  Weeks    Status  Partially Met    Target Date  11/26/18      PT LONG TERM GOAL #7   Title  Patient will stand 5 minutes to increase standing duration to allow patient to return to washing dishes and clothes.     Baseline  12/5: 56 seconds; 05/09/2018: 1 min 18 sec 2/6: 2 minutes last session 5/26: 1 minute 36 seconds limited by breathing 6/17: terminated due to abdomen pain     Time  8    Period  Weeks    Status  Partially Met    Target Date  11/26/18      PT LONG TERM GOAL #8   Title  Patient will negotiate 30 ft of changing surfaces outside such as pavement and parking lot to allow patient to walk into church.    Baseline  12/5: does not walk outside 2/24: unable to test due to rain 5/26: unable since COVID 6/17: raining    Time  8    Period  Weeks    Status  On-going    Target Date  11/26/18            Plan - 10/23/18 1111    Clinical Impression Statement  Patient presents with good motivation to today's physical therapy despite abdomen pain. Patient's pain assessed with noted downslip of lower rib spaces with pain improving with gentle upward mobilizations and taping. Patient perform goals with slight improvement in the three weeks for ambulatory and transferring goals. Patient's condition has the potential to improve in response to therapy. Maximum improvement is yet to be obtained. The anticipated improvement is attainable and reasonable in a generally predictable time.  Patient reports she has noticed her ambulation has continued to improve as well as her ability to lift her LE's for improved gait with less scuffing of her feet. The patient will continue to benefit from skilled therapeutic intervention to address deficits in strength, mobility, balance, and function for improved  overall QOL and safety.    Rehab Potential  Good    PT Frequency  2x / week    PT Duration  8 weeks    PT Treatment/Interventions  ADLs/Self Care Home Management;Aquatic  Therapy;Biofeedback;Cryotherapy;Electrical Stimulation;Iontophoresis 20m/ml Dexamethasone;Traction;Moist Heat;Ultrasound;DME Instruction;Gait training;Stair training;Functional mobility training;Therapeutic activities;Therapeutic exercise;Balance training;Neuromuscular re-education;Patient/family education;Orthotic Fit/Training;Wheelchair mobility training;Manual techniques;Compression bandaging;Passive range of motion;Dry needling;Energy conservation;Taping;Splinting    PT Next Visit Plan  strength/balance    PT Home Exercise Plan  to initiate next session (please incorporate hip flexor and adductor stretches in HEP if possible)    Consulted and Agree with Plan of Care  Patient       Patient will benefit from skilled therapeutic intervention in order to improve the following deficits and impairments:  Abnormal gait, Decreased activity tolerance, Decreased balance, Decreased coordination, Decreased endurance, Decreased knowledge of use of DME, Decreased mobility, Decreased range of motion, Decreased safety awareness, Decreased strength, Difficulty walking, Impaired perceived functional ability, Impaired sensation, Impaired tone, Impaired UE functional use, Improper body mechanics, Postural dysfunction  Visit Diagnosis: 1. Muscle weakness (generalized)   2. Other abnormalities of gait and mobility   3. Unsteadiness on feet        Problem List Patient Active Problem List   Diagnosis Date Noted  . Abscess of female pelvis   . SVT (supraventricular tachycardia) ([place]   . Radial styloid tenosynovitis 03/12/2018  . Wheelchair confinement 02/27/2018  . Localized osteoporosis with current pathological fracture with routine healing 01/19/2017  . Wrist fracture 01/16/2017  . Sprain of ankle 03/23/2016  . Closed fracture of  lateral malleolus 03/16/2016  . Health care maintenance 01/24/2016  . Blood pressure elevated without history of HTN 10/25/2015  . Essential hypertension 10/25/2015  . Multiple sclerosis ([place] 10/02/2015  . Chronic left shoulder pain 07/19/2015  . [place] sclerosis exacerbation ([place] 07/14/2015  . MS (multiple sclerosis) ([place] 11/26/2014  . Increased body mass index 11/26/2014  . HPV test positive 11/26/2014  . Status post laparoscopic supracervical hysterectomy 11/26/2014  . Galactorrhea 11/26/2014  . Back ache 05/21/2014  . Adiposity 05/21/2014  . Disordered sleep 05/21/2014  . Muscle spasticity 05/21/2014  . Spasticity 05/21/2014  . Calculus of kidney 12/09/2013  . Renal colic [id number]/3943 . Hypercholesteremia 08/19/2013  . Hereditary and idiopathic neuropathy 08/19/2013  . Hypercholesterolemia without hypertriglyceridemia 08/19/2013  . Bladder infection, chronic 07/25/2012  . Disorder of bladder function 07/25/2012  . Incomplete bladder emptying 07/25/2012  . Microscopic hematuria 07/25/2012  . Right upper quadrant pain 07/25/2012   MJanna Arch PT, DPT   10/23/2018, 11:12 AM  [place] [facility]SERV[facility] [address] [address]Phone: [phone]  Fax:  [phone] Name: JJaquilla WoodroofRamseur MRN: [id number] of Birth: [birth year]

## 2018-10-28 ENCOUNTER — Other Ambulatory Visit: Payer: Self-pay

## 2018-10-28 ENCOUNTER — Ambulatory Visit: Payer: Medicare HMO

## 2018-10-28 DIAGNOSIS — R2681 Unsteadiness on feet: Secondary | ICD-10-CM

## 2018-10-28 DIAGNOSIS — M6281 Muscle weakness (generalized): Secondary | ICD-10-CM

## 2018-10-28 DIAGNOSIS — R2689 Other abnormalities of gait and mobility: Secondary | ICD-10-CM

## 2018-10-28 NOTE — Therapy (Signed)
[place] MAIN [facility] SERVICES [address] [place], [place], [address] Phone: [phone]   Fax:  [phone]  Physical Therapy Treatment  Patient Details  Name: Lisa Williams MRN: [id number] Date of Birth: Apr 14, 1961 Referring Provider (PT): Gurney Maxin, MD   Encounter Date: 10/28/2018  PT End of Session - 10/28/18 1245    Visit Number  91    Number of Visits  99    Date for PT Re-Evaluation  11/26/18    PT Start Time  1017    PT Stop Time  1100    PT Time Calculation (min)  43 min    Equipment Utilized During Treatment  Gait belt    Activity Tolerance  Patient tolerated treatment well;Other (comment)    Behavior During Therapy  WFL for tasks assessed/performed       Past Medical History:  Diagnosis Date  . Abdominal pain, right upper quadrant   . Back pain   . Calculus of kidney 12/09/2013  . Chronic back pain    unspecified  . Chronic left shoulder pain 07/19/2015  . Functional disorder of bladder    other  . Galactorrhea 11/26/2014   Chronic   . Hereditary and idiopathic neuropathy 08/19/2013  . HPV test positive   . Hypercholesteremia 08/19/2013  . Incomplete bladder emptying   . Microscopic hematuria   . MS (multiple sclerosis) ([place])   . Muscle spasticity 05/21/2014  . Nonspecific findings on examination of urine    other  . Osteopenia   . Status post laparoscopic supracervical hysterectomy 11/26/2014  . Tobacco user 11/26/2014  . Wrist fracture     Past Surgical History:  Procedure Laterality Date  . bilateral tubal ligation  1996  . BREAST CYST EXCISION Left 2002  . KNEE SURGERY     right  . LAPAROSCOPIC SUPRACERVICAL HYSTERECTOMY  08/05/2013  . ORIF WRIST FRACTURE Left 01/17/2017   Procedure: OPEN REDUCTION INTERNAL FIXATION (ORIF) WRIST FRACTURE;  Surgeon: Lovell Sheehan, MD;  Location: [facility] ORS;  Service: Orthopedics;  Laterality: Left;  . TUBAL LIGATION Bilateral   . VAGINAL HYSTERECTOMY  03/2006     There were no vitals filed for this visit.  Subjective Assessment - 10/28/18 1021    Subjective  Patient reports her back is aching today, has had a good weekend, no falls or LOB since last session.    Patient is accompained by:  Family member    Pertinent History  Pt is a 58 y/o F who presents with BLE weakness and difficulty ambulating due to h/o MS that was diagnosed in 1995. Pt denies any pain associated. Pt was admitted to the hospital in November 2018 due to MS exacerbation. Nov-Dec to HHPT-->[facility] until end of Jan. After rehab the pt was able to ambulate up to 100 ft with the RW. Pt has Bil AFO, her therapist at [facility] thinks she needs a new AFO as her feet still drag when she ambulates, pt reports HHPT mentioned a spring AFO. Pt has custom AFOs that were originally made 4 years ago. Goals: improve balance, to be able to get into the shower with her tub bench without assist, to be able to get up 2 steps at sister's home with railings on both sides but too far apart to reach both sides, to be able to drive adapted car. Pt is staying at other sister's house with one step to get inside and currently picking up L leg with UEs and then stepping  in. Pt steps down leading with LLE. Pt wants to work on walking side to side as this is challenging and she had not yet progressed to this at rehab. Pt able to get in/out the car with assist only to bring BLEs into the car. Hoping to drive by June or July with adapted car (already has one). Pt needs assist bringing LEs into and out of hospital bed. Pt able to perform sit>stand without assist from [facility] with a pillow on the seat for a boost. Pt denies any falls in the past 6 months. Pt has been increasing her time alone at home up to a few hours at a time to become more independent. Pt has both manual and power WC. Uses power WC at home, uses manual WC out in community. Pt able to self propel manual WC. Pt needs assist putting pants around ankles but she  can pull them up. Pt can put on her shoes but needs help getting her leg up to do so. Pt able to tie her shoelaces on her own. Pt has a tub/shower unit at home with a tub bench. No grab bars in the shower. Has hand held shower head. Pt currently taking a sponge bath. Pt has a regular height toilet with a BSC over top. Pt with h/o L wrist fx with no precautions, pt wore her soft brace to evaluation for comfort but reports her wrist has healed and is doing well following OT.     Limitations  Lifting;Standing;Walking;House hold activities    Currently in Pain?  Yes    Pain Score  3     Pain Location  Back    Pain Orientation  Mid;Lower    Pain Descriptors / Indicators  Aching    Pain Type  Chronic pain    Pain Onset  In the past 7 days    Pain Frequency  Intermittent        Standing:   -terminated due to back pain     Seated:           3000 Gr weighted ball:                          -ball toss to varying planes of movement 60 seconds                         -chest press 15x                         -chest to head upright press 15x      -scapular retractions 15x 3 second hold     -Seated LAQ modified 12x    -Seated gluteal squeezes 15x 3 second holds  -seated abduction isometrics 10x 3 second holds  -seated adduction isometrics 10x 3 second holds   -swiss ball forward, lateral/horizontal pushes for decreased back pain 10x each direction.    -PVC pipe contract relax flexion/extension 10x, horizontal/angle for lat involvement 10x each.    -balloon taps with upright posture reaching inside and outside BOS 3 minutes  -hamstring stretch with PT overpressure 30 seconds x3 trials each LE.                    PT Education - 10/28/18 1022    Education provided  Yes    Education Details  exercise technique, sequencing/body Programmer, systems) Educated  Patient  Methods  Explanation;Demonstration;Tactile cues;Verbal cues    Comprehension  Verbalized  understanding;Returned demonstration;Verbal cues required;Tactile cues required       PT Short Term Goals - 10/23/18 1111      PT SHORT TERM GOAL #1   Title  Patient will be compliant with HEP 4-5 days/week for improved carryover between sessions.     Baseline  HEP compliancy between sessions 6/17 compliant    Time  2    Period  Weeks    Status  Achieved    Target Date  10/15/18      PT SHORT TERM GOAL #2   Title  Patient will report no falls/LOB to indicate improved stability with mobility     Baseline  6/17: no falls, one episode of instability    Time  2    Period  Weeks    Status  Partially Met    Target Date  11/06/18        PT Long Term Goals - 10/23/18 0001      PT LONG TERM GOAL #1   Title  Patient will perform TUG in 40 seconds or less with walker to improve safe negotiation of environment.     Baseline  5/26: 61 seconds 6/17: 60 seconds    Time  8    Period  Weeks    Status  Partially Met    Target Date  11/26/18      PT LONG TERM GOAL #2   Title  Pt's 75mT will improve to at least 0.5 m/s for improved safety ambulating in home    Baseline  0.14 m/s; 5/15: .25m  7/10: .36 m/s  8/20: .42 m/s  9/26: 0.2613m10/14: .26 m/s improved hip and knee clearance 11/15: 0.61m34mmproved hip and knee clearance; 12/5: .29 m/s improved step length and forward moment: decreased foot drag. 05/09/2018: 0.21 m/s 2/6: .237 m/s 5/26: .23 m/s 6/17: .24 m/s     Time  8    Period  Weeks    Status  Partially Met    Target Date  11/26/18      PT LONG TERM GOAL #3   Title  Pt's Bil hip F strength will improve to at least 2+/5 BLEs for improved gait mechanics and safety    Baseline  L: 2/5, R: 1/5 7/10: 2/5 bilaterally  8/20: 2/5 bilat 9/26: 2/5 bilat 10/14: 2/5 bilaterally ,improved hip flexor muscle activation 2/5: improved hip flexor and quad activation: 12/5: 2/5 ; 05/09/2018: 2/5 2/6: 3/5 knee, 2 to 2+/5 hip 5/26: 2-/5 hips 3/5 knees 6/17: 2-/5 hips 3/5 knees    Time  8    Period   Weeks    Status  Partially Met    Target Date  11/26/18      PT LONG TERM GOAL #4   Title  Pt's ABC Scale will improve to at least 40% to demonstrate improved balance    Baseline  28.13% 5/15; 24.4% 7/10: 23.4% 8/20: 22. 2% 9/26: 25.3% 10/14: 26.5% 11/15: 24.38% 12/5: 26%; 05/09/2018: 29% 2/6: 23.75% 526: 30% 6/17: 26.8%     Time  8    Period  Weeks    Status  Partially Met    Target Date  11/26/18      PT LONG TERM GOAL #5   Title  Patient will negotiate one step ~4 inches to enter house independently to increase functional independence in natural environment    Baseline  /20: unable to do 9/26: patient too fatigued/not  feeling well to perform 10/14: unable to perform at this time 11/15: unable to perform at this time; 12/5: able to lift 1 inch; 05/09/2018: unable to clear 4" step, able to lift foot ~ 1in 5/26: has been unable to perform 6/17: able to lift foot 1.25 inch    Time  8    Period  Weeks    Status  On-going    Target Date  11/26/18      PT LONG TERM GOAL #6   Title  Pt will be able to perform tub bench into/out of shower transfer without assist for improved independence with this task    Baseline  challenged with steps at this time, getting better, stands and gives sponge bath independently now  9/26: has not tried transfer yet 11/15: has not attempted yet; 05/09/2018: not attempted 5/26: able to do one time but needs a new shower chair    Time  8    Period  Weeks    Status  Partially Met    Target Date  11/26/18      PT LONG TERM GOAL #7   Title  Patient will stand 5 minutes to increase standing duration to allow patient to return to washing dishes and clothes.     Baseline  12/5: 56 seconds; 05/09/2018: 1 min 18 sec 2/6: 2 minutes last session 5/26: 1 minute 36 seconds limited by breathing 6/17: terminated due to abdomen pain     Time  8    Period  Weeks    Status  Partially Met    Target Date  11/26/18      PT LONG TERM GOAL #8   Title  Patient will negotiate 30 ft of  changing surfaces outside such as pavement and parking lot to allow patient to walk into church.    Baseline  12/5: does not walk outside 2/24: unable to test due to rain 5/26: unable since COVID 6/17: raining    Time  8    Period  Weeks    Status  On-going    Target Date  11/26/18            Plan - 10/28/18 1248    Clinical Impression Statement  Patient's session limited by back pain and spasm. Back pain was reduced with treatment and prolonged muscle holds with decreasing pain. Patient required frequent rest breaks with ice to reduce body temperature back to functional range. The patient will continue to benefit from skilled therapeutic intervention to address deficits in strength, mobility, balance, and function for improved overall QOL and safety    Rehab Potential  Good    PT Frequency  2x / week    PT Duration  8 weeks    PT Treatment/Interventions  ADLs/Self Care Home Management;Aquatic Therapy;Biofeedback;Cryotherapy;Electrical Stimulation;Iontophoresis 33m/ml Dexamethasone;Traction;Moist Heat;Ultrasound;DME Instruction;Gait training;Stair training;Functional mobility training;Therapeutic activities;Therapeutic exercise;Balance training;Neuromuscular re-education;Patient/family education;Orthotic Fit/Training;Wheelchair mobility training;Manual techniques;Compression bandaging;Passive range of motion;Dry needling;Energy conservation;Taping;Splinting    PT Next Visit Plan  strength/balance    PT Home Exercise Plan  to initiate next session (please incorporate hip flexor and adductor stretches in HEP if possible)    Consulted and Agree with Plan of Care  Patient       Patient will benefit from skilled therapeutic intervention in order to improve the following deficits and impairments:  Abnormal gait, Decreased activity tolerance, Decreased balance, Decreased coordination, Decreased endurance, Decreased knowledge of use of DME, Decreased mobility, Decreased range of motion, Decreased  safety awareness, Decreased strength, Difficulty walking, Impaired  perceived functional ability, Impaired sensation, Impaired tone, Impaired UE functional use, Improper body mechanics, Postural dysfunction  Visit Diagnosis: 1. Muscle weakness (generalized)   2. Other abnormalities of gait and mobility   3. Unsteadiness on feet        Problem List Patient Active Problem List   Diagnosis Date Noted  . Abscess of female pelvis   . SVT (supraventricular tachycardia) ([place])   . Radial styloid tenosynovitis 03/12/2018  . Wheelchair confinement 02/27/2018  . Localized osteoporosis with current pathological fracture with routine healing 01/19/2017  . Wrist fracture 01/16/2017  . Sprain of ankle 03/23/2016  . Closed fracture of lateral malleolus 03/16/2016  . Health care maintenance 01/24/2016  . Blood pressure elevated without history of HTN 10/25/2015  . Essential hypertension 10/25/2015  . Multiple sclerosis ([place]) 10/02/2015  . Chronic left shoulder pain 07/19/2015  . Multiple sclerosis exacerbation ([place]) 07/14/2015  . MS (multiple sclerosis) ([place]) 11/26/2014  . Increased body mass index 11/26/2014  . HPV test positive 11/26/2014  . Status post laparoscopic supracervical hysterectomy 11/26/2014  . Galactorrhea 11/26/2014  . Back ache 05/21/2014  . Adiposity 05/21/2014  . Disordered sleep 05/21/2014  . Muscle spasticity 05/21/2014  . Spasticity 05/21/2014  . Calculus of kidney 12/09/2013  . Renal colic 75/64/3329  . Hypercholesteremia 08/19/2013  . Hereditary and idiopathic neuropathy 08/19/2013  . Hypercholesterolemia without hypertriglyceridemia 08/19/2013  . Bladder infection, chronic 07/25/2012  . Disorder of bladder function 07/25/2012  . Incomplete bladder emptying 07/25/2012  . Microscopic hematuria 07/25/2012  . Right upper quadrant pain 07/25/2012   Janna Arch, PT, DPT   10/28/2018, 12:51 PM  [place] MAIN [facility]  SERVICES [address] [place], [place], [address] Phone: [phone]   Fax:  [phone]  Name: Lisa Williams MRN: [id number] Date of Birth: Aug 30, 1960

## 2018-10-30 ENCOUNTER — Ambulatory Visit: Payer: Medicare HMO

## 2018-10-30 ENCOUNTER — Other Ambulatory Visit: Payer: Self-pay

## 2018-10-30 DIAGNOSIS — M6281 Muscle weakness (generalized): Secondary | ICD-10-CM | POA: Diagnosis not present

## 2018-10-30 DIAGNOSIS — R2689 Other abnormalities of gait and mobility: Secondary | ICD-10-CM

## 2018-10-30 DIAGNOSIS — R2681 Unsteadiness on feet: Secondary | ICD-10-CM

## 2018-10-30 NOTE — Therapy (Signed)
[place] MAIN [facility] SERVICES [address] [place], [place], [address] Phone: [phone]   Fax:  [phone]  Physical Therapy Treatment  Patient Details  Name: Lisa Williams MRN: [id number] Date of Birth: 12/14/1960 Referring Provider (PT): Gurney Maxin, MD   Encounter Date: 10/30/2018  PT End of Session - 10/30/18 1021    Visit Number  92    Number of Visits  99    Date for PT Re-Evaluation  11/26/18    PT Start Time  3875    PT Stop Time  1059    PT Time Calculation (min)  44 min    Equipment Utilized During Treatment  Gait belt    Activity Tolerance  Patient tolerated treatment well;Other (comment)    Behavior During Therapy  WFL for tasks assessed/performed       Past Medical History:  Diagnosis Date  . Abdominal pain, right upper quadrant   . Back pain   . Calculus of kidney 12/09/2013  . Chronic back pain    unspecified  . Chronic left shoulder pain 07/19/2015  . Functional disorder of bladder    other  . Galactorrhea 11/26/2014   Chronic   . Hereditary and idiopathic neuropathy 08/19/2013  . HPV test positive   . Hypercholesteremia 08/19/2013  . Incomplete bladder emptying   . Microscopic hematuria   . MS (multiple sclerosis) ([place])   . Muscle spasticity 05/21/2014  . Nonspecific findings on examination of urine    other  . Osteopenia   . Status post laparoscopic supracervical hysterectomy 11/26/2014  . Tobacco user 11/26/2014  . Wrist fracture     Past Surgical History:  Procedure Laterality Date  . bilateral tubal ligation  1996  . BREAST CYST EXCISION Left 2002  . KNEE SURGERY     right  . LAPAROSCOPIC SUPRACERVICAL HYSTERECTOMY  08/05/2013  . ORIF WRIST FRACTURE Left 01/17/2017   Procedure: OPEN REDUCTION INTERNAL FIXATION (ORIF) WRIST FRACTURE;  Surgeon: Lovell Sheehan, MD;  Location: [facility] ORS;  Service: Orthopedics;  Laterality: Left;  . TUBAL LIGATION Bilateral   . VAGINAL HYSTERECTOMY  03/2006     There were no vitals filed for this visit.  Subjective Assessment - 10/30/18 1019    Subjective  Patient reports she slept wrong on her left shoulder which feels a little stiff this morning. Patient compliant with HEP no falls or LOB since last session.    Patient is accompained by:  Family member    Pertinent History  Pt is a 58 y/o F who presents with BLE weakness and difficulty ambulating due to h/o MS that was diagnosed in 1995. Pt denies any pain associated. Pt was admitted to the hospital in November 2018 due to MS exacerbation. Nov-Dec to HHPT-->[facility] until end of Jan. After rehab the pt was able to ambulate up to 100 ft with the RW. Pt has Bil AFO, her therapist at [facility] thinks she needs a new AFO as her feet still drag when she ambulates, pt reports HHPT mentioned a spring AFO. Pt has custom AFOs that were originally made 4 years ago. Goals: improve balance, to be able to get into the shower with her tub bench without assist, to be able to get up 2 steps at sister's home with railings on both sides but too far apart to reach both sides, to be able to drive adapted car. Pt is staying at other sister's house with one step to get inside and currently picking  up L leg with UEs and then stepping in. Pt steps down leading with LLE. Pt wants to work on walking side to side as this is challenging and she had not yet progressed to this at rehab. Pt able to get in/out the car with assist only to bring BLEs into the car. Hoping to drive by June or July with adapted car (already has one). Pt needs assist bringing LEs into and out of hospital bed. Pt able to perform sit>stand without assist from [facility] with a pillow on the seat for a boost. Pt denies any falls in the past 6 months. Pt has been increasing her time alone at home up to a few hours at a time to become more independent. Pt has both manual and power WC. Uses power WC at home, uses manual WC out in community. Pt able to self propel manual  WC. Pt needs assist putting pants around ankles but she can pull them up. Pt can put on her shoes but needs help getting her leg up to do so. Pt able to tie her shoelaces on her own. Pt has a tub/shower unit at home with a tub bench. No grab bars in the shower. Has hand held shower head. Pt currently taking a sponge bath. Pt has a regular height toilet with a BSC over top. Pt with h/o L wrist fx with no precautions, pt wore her soft brace to evaluation for comfort but reports her wrist has healed and is doing well following OT.     Limitations  Lifting;Standing;Walking;House hold activities    Currently in Pain?  Yes    Pain Score  2     Pain Location  Shoulder    Pain Orientation  Left    Pain Descriptors / Indicators  Aching    Pain Type  Acute pain    Pain Onset  Today    Pain Frequency  Intermittent    Pain Relieving Factors  rest       Treatment: Scapular retractions with circular movements for reduction of tension of L shoulder.  scapular rotations with overpressure into depression and rotation with stretch to bicepital tendons x 2 minutes Bicep/tricep muscle lengthening with trigger point holds x 3 minutes with multiple trigger points noted  Balloon taps with upright posture reaching inside and outside BOS x  3 minutes  Arnold forward rotation with external rotation x 10 with upright posture  Opposite UE/LE raises with upright posture for core contraction x 10 each side  RTB resisted adduction each LE 10x   RTB resisted abduction each LE 10x  hamstring isometric contraction into PT hand 10x 3 second hold each LE  Gluteal contractions 10x 3 second holds    Modified LAQ 10x each LE  -anterior posterior pelvic tilts in standing with RW 12x, very challenging to patient  -anterior pelvic tilt with forward step, posterior pelvic tilt with backward step 10x each LE, CGA     Pt educated throughout session about proper posture and technique with exercises. Improved exercise  technique, movement at target joints, use of target muscles after minverbal, visual, tactile cues  Seated rest breaks and use of ice pack utilized to keep body temperature at functional range to reduce MS exacerbation                   PT Education - 10/30/18 1020    Education provided  Yes    Education Details  exercise technique, stability, body mechanics  Person(s) Educated  Patient    Methods  Explanation;Demonstration;Tactile cues;Verbal cues    Comprehension  Verbalized understanding;Returned demonstration;Verbal cues required;Tactile cues required       PT Short Term Goals - 10/23/18 1111      PT SHORT TERM GOAL #1   Title  Patient will be compliant with HEP 4-5 days/week for improved carryover between sessions.     Baseline  HEP compliancy between sessions 6/17 compliant    Time  2    Period  Weeks    Status  Achieved    Target Date  10/15/18      PT SHORT TERM GOAL #2   Title  Patient will report no falls/LOB to indicate improved stability with mobility     Baseline  6/17: no falls, one episode of instability    Time  2    Period  Weeks    Status  Partially Met    Target Date  11/06/18        PT Long Term Goals - 10/23/18 0001      PT LONG TERM GOAL #1   Title  Patient will perform TUG in 40 seconds or less with walker to improve safe negotiation of environment.     Baseline  5/26: 61 seconds 6/17: 60 seconds    Time  8    Period  Weeks    Status  Partially Met    Target Date  11/26/18      PT LONG TERM GOAL #2   Title  Pt's 8mT will improve to at least 0.5 m/s for improved safety ambulating in home    Baseline  0.14 m/s; 5/15: .258m  7/10: .36 m/s  8/20: .42 m/s  9/26: 0.2613m10/14: .26 m/s improved hip and knee clearance 11/15: 0.84m34mmproved hip and knee clearance; 12/5: .29 m/s improved step length and forward moment: decreased foot drag. 05/09/2018: 0.21 m/s 2/6: .237 m/s 5/26: .23 m/s 6/17: .24 m/s     Time  8    Period  Weeks     Status  Partially Met    Target Date  11/26/18      PT LONG TERM GOAL #3   Title  Pt's Bil hip F strength will improve to at least 2+/5 BLEs for improved gait mechanics and safety    Baseline  L: 2/5, R: 1/5 7/10: 2/5 bilaterally  8/20: 2/5 bilat 9/26: 2/5 bilat 10/14: 2/5 bilaterally ,improved hip flexor muscle activation 2/5: improved hip flexor and quad activation: 12/5: 2/5 ; 05/09/2018: 2/5 2/6: 3/5 knee, 2 to 2+/5 hip 5/26: 2-/5 hips 3/5 knees 6/17: 2-/5 hips 3/5 knees    Time  8    Period  Weeks    Status  Partially Met    Target Date  11/26/18      PT LONG TERM GOAL #4   Title  Pt's ABC Scale will improve to at least 40% to demonstrate improved balance    Baseline  28.13% 5/15; 24.4% 7/10: 23.4% 8/20: 22. 2% 9/26: 25.3% 10/14: 26.5% 11/15: 24.38% 12/5: 26%; 05/09/2018: 29% 2/6: 23.75% 526: 30% 6/17: 26.8%     Time  8    Period  Weeks    Status  Partially Met    Target Date  11/26/18      PT LONG TERM GOAL #5   Title  Patient will negotiate one step ~4 inches to enter house independently to increase functional independence in natural environment    Baseline  /20: unable  to do 9/26: patient too fatigued/not feeling well to perform 10/14: unable to perform at this time 11/15: unable to perform at this time; 12/5: able to lift 1 inch; 05/09/2018: unable to clear 4" step, able to lift foot ~ 1in 5/26: has been unable to perform 6/17: able to lift foot 1.25 inch    Time  8    Period  Weeks    Status  On-going    Target Date  11/26/18      PT LONG TERM GOAL #6   Title  Pt will be able to perform tub bench into/out of shower transfer without assist for improved independence with this task    Baseline  challenged with steps at this time, getting better, stands and gives sponge bath independently now  9/26: has not tried transfer yet 11/15: has not attempted yet; 05/09/2018: not attempted 5/26: able to do one time but needs a new shower chair    Time  8    Period  Weeks    Status  Partially  Met    Target Date  11/26/18      PT LONG TERM GOAL #7   Title  Patient will stand 5 minutes to increase standing duration to allow patient to return to washing dishes and clothes.     Baseline  12/5: 56 seconds; 05/09/2018: 1 min 18 sec 2/6: 2 minutes last session 5/26: 1 minute 36 seconds limited by breathing 6/17: terminated due to abdomen pain     Time  8    Period  Weeks    Status  Partially Met    Target Date  11/26/18      PT LONG TERM GOAL #8   Title  Patient will negotiate 30 ft of changing surfaces outside such as pavement and parking lot to allow patient to walk into church.    Baseline  12/5: does not walk outside 2/24: unable to test due to rain 5/26: unable since COVID 6/17: raining    Time  8    Period  Weeks    Status  On-going    Target Date  11/26/18            Plan - 10/30/18 1114    Clinical Impression Statement  Patient presents with slight pain in L shoulder/biceps/triceps that is relieved by end of session. Patient is demonstrating increasing strength of LE's with ability to perform abduction and adduction against resistance for the first time. The patient will continue to benefit from skilled therapeutic intervention to address deficits in strength, mobility, balance, and function for improved overall QOL and safety    Rehab Potential  Good    PT Frequency  2x / week    PT Duration  8 weeks    PT Treatment/Interventions  ADLs/Self Care Home Management;Aquatic Therapy;Biofeedback;Cryotherapy;Electrical Stimulation;Iontophoresis 8m/ml Dexamethasone;Traction;Moist Heat;Ultrasound;DME Instruction;Gait training;Stair training;Functional mobility training;Therapeutic activities;Therapeutic exercise;Balance training;Neuromuscular re-education;Patient/family education;Orthotic Fit/Training;Wheelchair mobility training;Manual techniques;Compression bandaging;Passive range of motion;Dry needling;Energy conservation;Taping;Splinting    PT Next Visit Plan  strength/balance     PT Home Exercise Plan  to initiate next session (please incorporate hip flexor and adductor stretches in HEP if possible)    Consulted and Agree with Plan of Care  Patient       Patient will benefit from skilled therapeutic intervention in order to improve the following deficits and impairments:  Abnormal gait, Decreased activity tolerance, Decreased balance, Decreased coordination, Decreased endurance, Decreased knowledge of use of DME, Decreased mobility, Decreased range of motion, Decreased safety awareness,  Decreased strength, Difficulty walking, Impaired perceived functional ability, Impaired sensation, Impaired tone, Impaired UE functional use, Improper body mechanics, Postural dysfunction  Visit Diagnosis: 1. Muscle weakness (generalized)   2. Other abnormalities of gait and mobility   3. Unsteadiness on feet        Problem List Patient Active Problem List   Diagnosis Date Noted  . Abscess of female pelvis   . SVT (supraventricular tachycardia) ([place])   . Radial styloid tenosynovitis 03/12/2018  . Wheelchair confinement 02/27/2018  . Localized osteoporosis with current pathological fracture with routine healing 01/19/2017  . Wrist fracture 01/16/2017  . Sprain of ankle 03/23/2016  . Closed fracture of lateral malleolus 03/16/2016  . Health care maintenance 01/24/2016  . Blood pressure elevated without history of HTN 10/25/2015  . Essential hypertension 10/25/2015  . Multiple sclerosis ([place]) 10/02/2015  . Chronic left shoulder pain 07/19/2015  . Multiple sclerosis exacerbation ([place]) 07/14/2015  . MS (multiple sclerosis) ([place]) 11/26/2014  . Increased body mass index 11/26/2014  . HPV test positive 11/26/2014  . Status post laparoscopic supracervical hysterectomy 11/26/2014  . Galactorrhea 11/26/2014  . Back ache 05/21/2014  . Adiposity 05/21/2014  . Disordered sleep 05/21/2014  . Muscle spasticity 05/21/2014  . Spasticity 05/21/2014  . Calculus of kidney 12/09/2013   . Renal colic 38/18/2993  . Hypercholesteremia 08/19/2013  . Hereditary and idiopathic neuropathy 08/19/2013  . Hypercholesterolemia without hypertriglyceridemia 08/19/2013  . Bladder infection, chronic 07/25/2012  . Disorder of bladder function 07/25/2012  . Incomplete bladder emptying 07/25/2012  . Microscopic hematuria 07/25/2012  . Right upper quadrant pain 07/25/2012   Janna Arch, PT, DPT   10/30/2018, 11:15 AM  [place] MAIN [facility] SERVICES [address] [place], [place], [address] Phone: [phone]   Fax:  [phone]  Name: Lisa Williams MRN: [id number] Date of Birth: [birth year]

## 2018-11-04 ENCOUNTER — Ambulatory Visit: Payer: Medicare HMO

## 2018-11-04 ENCOUNTER — Other Ambulatory Visit: Payer: Self-pay

## 2018-11-04 DIAGNOSIS — M5412 Radiculopathy, cervical region: Secondary | ICD-10-CM

## 2018-11-04 DIAGNOSIS — R2689 Other abnormalities of gait and mobility: Secondary | ICD-10-CM

## 2018-11-04 DIAGNOSIS — R2681 Unsteadiness on feet: Secondary | ICD-10-CM

## 2018-11-04 DIAGNOSIS — M6281 Muscle weakness (generalized): Secondary | ICD-10-CM

## 2018-11-04 NOTE — Therapy (Signed)
[place] MAIN [facility] SERVICES [address] [place], [place], [address] Phone: [phone]   Fax:  [phone]  Physical Therapy Treatment  Patient Details  Name: Lisa Williams MRN: [id number] Date of Birth: 19-Jan-1961 Referring Provider (PT): Gurney Maxin, MD   Encounter Date: 11/04/2018  PT End of Session - 11/04/18 1025    Visit Number  93    Number of Visits  99    Date for PT Re-Evaluation  11/26/18    Authorization Type  8/10 starting 5/26)    PT Start Time  1013    PT Stop Time  1053    PT Time Calculation (min)  40 min    Equipment Utilized During Treatment  Gait belt    Activity Tolerance  Patient tolerated treatment well;Other (comment)    Behavior During Therapy  WFL for tasks assessed/performed       Past Medical History:  Diagnosis Date  . Abdominal pain, right upper quadrant   . Back pain   . Calculus of kidney 12/09/2013  . Chronic back pain    unspecified  . Chronic left shoulder pain 07/19/2015  . Functional disorder of bladder    other  . Galactorrhea 11/26/2014   Chronic   . Hereditary and idiopathic neuropathy 08/19/2013  . HPV test positive   . Hypercholesteremia 08/19/2013  . Incomplete bladder emptying   . Microscopic hematuria   . MS (multiple sclerosis) ([place])   . Muscle spasticity 05/21/2014  . Nonspecific findings on examination of urine    other  . Osteopenia   . Status post laparoscopic supracervical hysterectomy 11/26/2014  . Tobacco user 11/26/2014  . Wrist fracture     Past Surgical History:  Procedure Laterality Date  . bilateral tubal ligation  1996  . BREAST CYST EXCISION Left 2002  . KNEE SURGERY     right  . LAPAROSCOPIC SUPRACERVICAL HYSTERECTOMY  08/05/2013  . ORIF WRIST FRACTURE Left 01/17/2017   Procedure: OPEN REDUCTION INTERNAL FIXATION (ORIF) WRIST FRACTURE;  Surgeon: Lovell Sheehan, MD;  Location: [facility] ORS;  Service: Orthopedics;  Laterality: Left;  . TUBAL LIGATION  Bilateral   . VAGINAL HYSTERECTOMY  03/2006    There were no vitals filed for this visit.  Subjective Assessment - 11/04/18 1021    Subjective  Pt doing well this date, reports shoulder pain is improved. No major updates from weekend. Walking remains unchanged largely.    Pertinent History  Pt is a 58 y/o F who presents with BLE weakness and difficulty ambulating due to h/o MS that was diagnosed in 1995. Pt denies any pain associated. Pt was admitted to the hospital in November 2018 due to MS exacerbation. Nov-Dec to HHPT-->[facility] until end of Jan. After rehab the pt was able to ambulate up to 100 ft with the RW. Pt has Bil AFO, her therapist at [facility] thinks she needs a new AFO as her feet still drag when she ambulates, pt reports HHPT mentioned a spring AFO. Pt has custom AFOs that were originally made 4 years ago. Goals: improve balance, to be able to get into the shower with her tub bench without assist, to be able to get up 2 steps at sister's home with railings on both sides but too far apart to reach both sides, to be able to drive adapted car. Pt is staying at other sister's house with one step to get inside and currently picking up L leg with UEs and then stepping in.  Pt steps down leading with LLE. Pt wants to work on walking side to side as this is challenging and she had not yet progressed to this at rehab. Pt able to get in/out the car with assist only to bring BLEs into the car. Hoping to drive by June or July with adapted car (already has one). Pt needs assist bringing LEs into and out of hospital bed. Pt able to perform sit>stand without assist from [facility] with a pillow on the seat for a boost. Pt denies any falls in the past 6 months. Pt has been increasing her time alone at home up to a few hours at a time to become more independent. Pt has both manual and power WC. Uses power WC at home, uses manual WC out in community. Pt able to self propel manual WC. Pt needs assist putting pants  around ankles but she can pull them up. Pt can put on her shoes but needs help getting her leg up to do so. Pt able to tie her shoelaces on her own. Pt has a tub/shower unit at home with a tub bench. No grab bars in the shower. Has hand held shower head. Pt currently taking a sponge bath. Pt has a regular height toilet with a BSC over top. Pt with h/o L wrist fx with no precautions, pt wore her soft brace to evaluation for comfort but reports her wrist has healed and is doing well following OT.        INTERVENTION THIS DATE:   -Scapular retractions with circular movements for reduction of tension of L shoulder.  2x15 (one set with thoracic towel roll)  -BUE 'dislocations' with straight weight, wide grip: 1x15 -Seated Opposite UE/LE raises with upright posture for core contraction x 10 each side, alternating pattern  -RTB resisted abduction each LE 10x -seated pillow squeeze 1x15x3secH -AMB c RW 72f; declines additional AMB later d/t general fatigue -seated LAQ 1x10 bilat, with gait belt self assist for TKE (full range) -physio ball trunk rotation in chair 1x15 alternating pattern (WC arms slightly inhibiting) cues for increased trunk contribution -seated t-spine towel roll stretch 12 minutes (performed concordantly with other interventions)  -WC self propulsion: 5x674f intermittent in session -FWD physioball rolls, seated: 5x  (VC for scapulothoracic stretch), followed by 5x to each side.   PT Short Term Goals - 10/23/18 1111      PT SHORT TERM GOAL #1   Title  Patient will be compliant with HEP 4-5 days/week for improved carryover between sessions.     Baseline  HEP compliancy between sessions 6/17 compliant    Time  2    Period  Weeks    Status  Achieved    Target Date  10/15/18      PT SHORT TERM GOAL #2   Title  Patient will report no falls/LOB to indicate improved stability with mobility     Baseline  6/17: no falls, one episode of instability    Time  2    Period  Weeks     Status  Partially Met    Target Date  11/06/18        PT Long Term Goals - 10/23/18 0001      PT LONG TERM GOAL #1   Title  Patient will perform TUG in 40 seconds or less with walker to improve safe negotiation of environment.     Baseline  5/26: 61 seconds 6/17: 60 seconds    Time  8  Period  Weeks    Status  Partially Met    Target Date  11/26/18      PT LONG TERM GOAL #2   Title  Pt's 76mT will improve to at least 0.5 m/s for improved safety ambulating in home    Baseline  0.14 m/s; 5/15: .226m  7/10: .36 m/s  8/20: .42 m/s  9/26: 0.2663m10/14: .26 m/s improved hip and knee clearance 11/15: 0.77m51mmproved hip and knee clearance; 12/5: .29 m/s improved step length and forward moment: decreased foot drag. 05/09/2018: 0.21 m/s 2/6: .237 m/s 5/26: .23 m/s 6/17: .24 m/s     Time  8    Period  Weeks    Status  Partially Met    Target Date  11/26/18      PT LONG TERM GOAL #3   Title  Pt's Bil hip F strength will improve to at least 2+/5 BLEs for improved gait mechanics and safety    Baseline  L: 2/5, R: 1/5 7/10: 2/5 bilaterally  8/20: 2/5 bilat 9/26: 2/5 bilat 10/14: 2/5 bilaterally ,improved hip flexor muscle activation 2/5: improved hip flexor and quad activation: 12/5: 2/5 ; 05/09/2018: 2/5 2/6: 3/5 knee, 2 to 2+/5 hip 5/26: 2-/5 hips 3/5 knees 6/17: 2-/5 hips 3/5 knees    Time  8    Period  Weeks    Status  Partially Met    Target Date  11/26/18      PT LONG TERM GOAL #4   Title  Pt's ABC Scale will improve to at least 40% to demonstrate improved balance    Baseline  28.13% 5/15; 24.4% 7/10: 23.4% 8/20: 22. 2% 9/26: 25.3% 10/14: 26.5% 11/15: 24.38% 12/5: 26%; 05/09/2018: 29% 2/6: 23.75% 526: 30% 6/17: 26.8%     Time  8    Period  Weeks    Status  Partially Met    Target Date  11/26/18      PT LONG TERM GOAL #5   Title  Patient will negotiate one step ~4 inches to enter house independently to increase functional independence in natural environment    Baseline  /20: unable  to do 9/26: patient too fatigued/not feeling well to perform 10/14: unable to perform at this time 11/15: unable to perform at this time; 12/5: able to lift 1 inch; 05/09/2018: unable to clear 4" step, able to lift foot ~ 1in 5/26: has been unable to perform 6/17: able to lift foot 1.25 inch    Time  8    Period  Weeks    Status  On-going    Target Date  11/26/18      PT LONG TERM GOAL #6   Title  Pt will be able to perform tub bench into/out of shower transfer without assist for improved independence with this task    Baseline  challenged with steps at this time, getting better, stands and gives sponge bath independently now  9/26: has not tried transfer yet 11/15: has not attempted yet; 05/09/2018: not attempted 5/26: able to do one time but needs a new shower chair    Time  8    Period  Weeks    Status  Partially Met    Target Date  11/26/18      PT LONG TERM GOAL #7   Title  Patient will stand 5 minutes to increase standing duration to allow patient to return to washing dishes and clothes.     Baseline  12/5: 56 seconds; 05/09/2018: 1  min 18 sec 2/6: 2 minutes last session 5/26: 1 minute 36 seconds limited by breathing 6/17: terminated due to abdomen pain     Time  8    Period  Weeks    Status  Partially Met    Target Date  11/26/18      PT LONG TERM GOAL #8   Title  Patient will negotiate 30 ft of changing surfaces outside such as pavement and parking lot to allow patient to walk into church.    Baseline  12/5: does not walk outside 2/24: unable to test due to rain 5/26: unable since COVID 6/17: raining    Time  8    Period  Weeks    Status  On-going    Target Date  11/26/18            Plan - 11/04/18 1033    Clinical Impression Statement  Continued with activity from last session, focus on core strengthening and stabilization. Pt elects to include some AMB intervals this date. Overall pt tolerating session well, ice breaks provided as needed to prevent overheating.    Rehab  Potential  Good    PT Frequency  2x / week    PT Duration  8 weeks    PT Treatment/Interventions  ADLs/Self Care Home Management;Aquatic Therapy;Biofeedback;Cryotherapy;Electrical Stimulation;Iontophoresis 39m/ml Dexamethasone;Traction;Moist Heat;Ultrasound;DME Instruction;Gait training;Stair training;Functional mobility training;Therapeutic activities;Therapeutic exercise;Balance training;Neuromuscular re-education;Patient/family education;Orthotic Fit/Training;Wheelchair mobility training;Manual techniques;Compression bandaging;Passive range of motion;Dry needling;Energy conservation;Taping;Splinting    PT Next Visit Plan  strength/balance    PT Home Exercise Plan  to initiate next session (please incorporate hip flexor and adductor stretches in HEP if possible)    Consulted and Agree with Plan of Care  Patient       Patient will benefit from skilled therapeutic intervention in order to improve the following deficits and impairments:  Abnormal gait, Decreased activity tolerance, Decreased balance, Decreased coordination, Decreased endurance, Decreased knowledge of use of DME, Decreased mobility, Decreased range of motion, Decreased safety awareness, Decreased strength, Difficulty walking, Impaired perceived functional ability, Impaired sensation, Impaired tone, Impaired UE functional use, Improper body mechanics, Postural dysfunction  Visit Diagnosis: 1. Muscle weakness (generalized)   2. Other abnormalities of gait and mobility   3. Unsteadiness on feet   4. Radiculopathy, cervical region        Problem List Patient Active Problem List   Diagnosis Date Noted  . Abscess of female pelvis   . SVT (supraventricular tachycardia) ([place]   . Radial styloid tenosynovitis 03/12/2018  . Wheelchair confinement 02/27/2018  . Localized osteoporosis with current pathological fracture with routine healing 01/19/2017  . Wrist fracture 01/16/2017  . Sprain of ankle 03/23/2016  . Closed fracture of  lateral malleolus 03/16/2016  . Health care maintenance 01/24/2016  . Blood pressure elevated without history of HTN 10/25/2015  . Essential hypertension 10/25/2015  . Multiple sclerosis ([place] 10/02/2015  . Chronic left shoulder pain 07/19/2015  . Multiple sclerosis exacerbation ([place] 07/14/2015  . MS (multiple sclerosis) ([place] 11/26/2014  . Increased body mass index 11/26/2014  . HPV test positive 11/26/2014  . Status post laparoscopic supracervical hysterectomy 11/26/2014  . Galactorrhea 11/26/2014  . Back ache 05/21/2014  . Adiposity 05/21/2014  . Disordered sleep 05/21/2014  . Muscle spasticity 05/21/2014  . Spasticity 05/21/2014  . Calculus of kidney 12/09/2013  . Renal colic [id number]/6195 . Hypercholesteremia 08/19/2013  . Hereditary and idiopathic neuropathy 08/19/2013  . Hypercholesterolemia without hypertriglyceridemia 08/19/2013  . Bladder infection, chronic 07/25/2012  . Disorder of  bladder function 07/25/2012  . Incomplete bladder emptying 07/25/2012  . Microscopic hematuria 07/25/2012  . Right upper quadrant pain 07/25/2012   10:51 AM, 11/04/18 Etta Grandchild, PT, DPT Physical Therapist - [place] [phone]     Etta Grandchild 11/04/2018, 10:44 AM  [place] MAIN [facility] SERVICES [address] [place], [place], [address] Phone: [phone]   Fax:  [phone]  Name: Lisa Williams MRN: [id number] Date of Birth: [birth year]

## 2018-11-06 ENCOUNTER — Other Ambulatory Visit: Payer: Self-pay

## 2018-11-06 ENCOUNTER — Ambulatory Visit: Payer: Medicare HMO | Attending: Neurology

## 2018-11-06 DIAGNOSIS — M6281 Muscle weakness (generalized): Secondary | ICD-10-CM

## 2018-11-06 DIAGNOSIS — R2689 Other abnormalities of gait and mobility: Secondary | ICD-10-CM | POA: Diagnosis present

## 2018-11-06 DIAGNOSIS — R2681 Unsteadiness on feet: Secondary | ICD-10-CM | POA: Diagnosis present

## 2018-11-06 NOTE — Therapy (Signed)
[place] [facility] SERVICES [address] [place], [place], [address] Phone: [phone]   Fax:  [phone]  Physical Therapy Treatment  Patient Details  Name: Lisa Williams MRN: [id number] Date of Birth: 1960-08-13 Referring Provider (PT): Gurney Maxin, MD   Encounter Date: 11/06/2018  PT End of Session - 11/06/18 1111    Visit Number  94    Number of Visits  99    Date for PT Re-Evaluation  11/26/18    PT Start Time  1007    PT Stop Time  1057    PT Time Calculation (min)  50 min    Equipment Utilized During Treatment  Gait belt    Activity Tolerance  Patient tolerated treatment well;Other (comment)   heat intolerance and L shoulder pain   Behavior During Therapy  WFL for tasks assessed/performed       Past Medical History:  Diagnosis Date  . Abdominal pain, right upper quadrant   . Back pain   . Calculus of kidney 12/09/2013  . Chronic back pain    unspecified  . Chronic left shoulder pain 07/19/2015  . Functional disorder of bladder    other  . Galactorrhea 11/26/2014   Chronic   . Hereditary and idiopathic neuropathy 08/19/2013  . HPV test positive   . Hypercholesteremia 08/19/2013  . Incomplete bladder emptying   . Microscopic hematuria   . MS (multiple sclerosis) ([place])   . Muscle spasticity 05/21/2014  . Nonspecific findings on examination of urine    other  . Osteopenia   . Status post laparoscopic supracervical hysterectomy 11/26/2014  . Tobacco user 11/26/2014  . Wrist fracture     Past Surgical History:  Procedure Laterality Date  . bilateral tubal ligation  1996  . BREAST CYST EXCISION Left 2002  . KNEE SURGERY     right  . LAPAROSCOPIC SUPRACERVICAL HYSTERECTOMY  08/05/2013  . ORIF WRIST FRACTURE Left 01/17/2017   Procedure: OPEN REDUCTION INTERNAL FIXATION (ORIF) WRIST FRACTURE;  Surgeon: Lovell Sheehan, MD;  Location: [facility] ORS;  Service: Orthopedics;  Laterality: Left;  . TUBAL LIGATION Bilateral    . VAGINAL HYSTERECTOMY  03/2006    There were no vitals filed for this visit.  Subjective Assessment - 11/06/18 1109    Subjective  Patient reports she is feeling a little overheated. Has been compliant with HEP. no falls or LOB since last session    Pertinent History  Pt is a 58 y/o F who presents with BLE weakness and difficulty ambulating due to h/o MS that was diagnosed in 1995. Pt denies any pain associated. Pt was admitted to the hospital in November 2018 due to MS exacerbation. Nov-Dec to HHPT-->[facility] until end of Jan. After rehab the pt was able to ambulate up to 100 ft with the RW. Pt has Bil AFO, her therapist at [facility] thinks she needs a new AFO as her feet still drag when she ambulates, pt reports HHPT mentioned a spring AFO. Pt has custom AFOs that were originally made 4 years ago. Goals: improve balance, to be able to get into the shower with her tub bench without assist, to be able to get up 2 steps at sister's home with railings on both sides but too far apart to reach both sides, to be able to drive adapted car. Pt is staying at other sister's house with one step to get inside and currently picking up L leg with UEs and then stepping in. Pt  steps down leading with LLE. Pt wants to work on walking side to side as this is challenging and she had not yet progressed to this at rehab. Pt able to get in/out the car with assist only to bring BLEs into the car. Hoping to drive by June or July with adapted car (already has one). Pt needs assist bringing LEs into and out of hospital bed. Pt able to perform sit>stand without assist from [facility] with a pillow on the seat for a boost. Pt denies any falls in the past 6 months. Pt has been increasing her time alone at home up to a few hours at a time to become more independent. Pt has both manual and power WC. Uses power WC at home, uses manual WC out in community. Pt able to self propel manual WC. Pt needs assist putting pants around ankles but  she can pull them up. Pt can put on her shoes but needs help getting her leg up to do so. Pt able to tie her shoelaces on her own. Pt has a tub/shower unit at home with a tub bench. No grab bars in the shower. Has hand held shower head. Pt currently taking a sponge bath. Pt has a regular height toilet with a BSC over top. Pt with h/o L wrist fx with no precautions, pt wore her soft brace to evaluation for comfort but reports her wrist has healed and is doing well following OT.     Currently in Pain?  Yes    Pain Score  3     Pain Location  Shoulder    Pain Orientation  Left    Pain Descriptors / Indicators  Aching    Pain Type  Acute pain    Pain Onset  In the past 7 days    Pain Frequency  Intermittent    Aggravating Factors   transfers and standing    Pain Relieving Factors  anterior muscle sretching         Standing:   -lateral weight shift 60 seconds, fatigue with prolonged standing with lateral movements.    -anterior posterior pelvic tilts in standing with RW 12x, very challenging to patient   -RW single hand raises 10x each LE  -sit to stands 5x  Seated:     -scapular retractions 15x 3 second holds     -Seated LAQ modified 12x   -straight arm lat pull down RTB 12x  -large circles straight arm 12x clockwise, 12x counterclockwise  -RTB adduction 10x each LE  -RTB abduction 10x each LE   -modified seated windmills 5x each direction, fatigue by end of 4th rep   -pectoral stretch with overpressure in varying angles from 20 degrees to 110 degrees with arm bent and straight of LUE, holds for 20 seconds each plane: 5 minutes total. Reduction of shoulder pain by end of session    Pt educated throughout session about proper posture and technique with exercises. Improved exercise technique, movement at target joints, use of target muscles after min verbal, visual, tactile cues   Seated rest breaks and use of ice pack utilized to keep body temperature at functional range to  reduce MS exacerbation.  Patient educated on pectoral and anterior shoulder muscle stretches for reduction of pain at home: including supine robber stretch and seated stretch with PVC pipe                          PT Education - 11/06/18  1110    Education provided  Yes    Education Details  exercise technique, stability, body mechanics, L shoulder stretch for pain relief    Person(s) Educated  Patient    Methods  Explanation;Demonstration;Tactile cues    Comprehension  Returned demonstration;Verbal cues required;Tactile cues required;Need further instruction       PT Short Term Goals - 10/23/18 1111      PT SHORT TERM GOAL #1   Title  Patient will be compliant with HEP 4-5 days/week for improved carryover between sessions.     Baseline  HEP compliancy between sessions 6/17 compliant    Time  2    Period  Weeks    Status  Achieved    Target Date  10/15/18      PT SHORT TERM GOAL #2   Title  Patient will report no falls/LOB to indicate improved stability with mobility     Baseline  6/17: no falls, one episode of instability    Time  2    Period  Weeks    Status  Partially Met    Target Date  11/06/18        PT Long Term Goals - 10/23/18 0001      PT LONG TERM GOAL #1   Title  Patient will perform TUG in 40 seconds or less with walker to improve safe negotiation of environment.     Baseline  5/26: 61 seconds 6/17: 60 seconds    Time  8    Period  Weeks    Status  Partially Met    Target Date  11/26/18      PT LONG TERM GOAL #2   Title  Pt's 42mT will improve to at least 0.5 m/s for improved safety ambulating in home    Baseline  0.14 m/s; 5/15: .294m  7/10: .36 m/s  8/20: .42 m/s  9/26: 0.2610m10/14: .26 m/s improved hip and knee clearance 11/15: 0.53m46mmproved hip and knee clearance; 12/5: .29 m/s improved step length and forward moment: decreased foot drag. 05/09/2018: 0.21 m/s 2/6: .237 m/s 5/26: .23 m/s 6/17: .24 m/s     Time  8    Period   Weeks    Status  Partially Met    Target Date  11/26/18      PT LONG TERM GOAL #3   Title  Pt's Bil hip F strength will improve to at least 2+/5 BLEs for improved gait mechanics and safety    Baseline  L: 2/5, R: 1/5 7/10: 2/5 bilaterally  8/20: 2/5 bilat 9/26: 2/5 bilat 10/14: 2/5 bilaterally ,improved hip flexor muscle activation 2/5: improved hip flexor and quad activation: 12/5: 2/5 ; 05/09/2018: 2/5 2/6: 3/5 knee, 2 to 2+/5 hip 5/26: 2-/5 hips 3/5 knees 6/17: 2-/5 hips 3/5 knees    Time  8    Period  Weeks    Status  Partially Met    Target Date  11/26/18      PT LONG TERM GOAL #4   Title  Pt's ABC Scale will improve to at least 40% to demonstrate improved balance    Baseline  28.13% 5/15; 24.4% 7/10: 23.4% 8/20: 22. 2% 9/26: 25.3% 10/14: 26.5% 11/15: 24.38% 12/5: 26%; 05/09/2018: 29% 2/6: 23.75% 526: 30% 6/17: 26.8%     Time  8    Period  Weeks    Status  Partially Met    Target Date  11/26/18      PT LONG TERM GOAL #5  Title  Patient will negotiate one step ~4 inches to enter house independently to increase functional independence in natural environment    Baseline  /20: unable to do 9/26: patient too fatigued/not feeling well to perform 10/14: unable to perform at this time 11/15: unable to perform at this time; 12/5: able to lift 1 inch; 05/09/2018: unable to clear 4" step, able to lift foot ~ 1in 5/26: has been unable to perform 6/17: able to lift foot 1.25 inch    Time  8    Period  Weeks    Status  On-going    Target Date  11/26/18      PT LONG TERM GOAL #6   Title  Pt will be able to perform tub bench into/out of shower transfer without assist for improved independence with this task    Baseline  challenged with steps at this time, getting better, stands and gives sponge bath independently now  9/26: has not tried transfer yet 11/15: has not attempted yet; 05/09/2018: not attempted 5/26: able to do one time but needs a new shower chair    Time  8    Period  Weeks    Status   Partially Met    Target Date  11/26/18      PT LONG TERM GOAL #7   Title  Patient will stand 5 minutes to increase standing duration to allow patient to return to washing dishes and clothes.     Baseline  12/5: 56 seconds; 05/09/2018: 1 min 18 sec 2/6: 2 minutes last session 5/26: 1 minute 36 seconds limited by breathing 6/17: terminated due to abdomen pain     Time  8    Period  Weeks    Status  Partially Met    Target Date  11/26/18      PT LONG TERM GOAL #8   Title  Patient will negotiate 30 ft of changing surfaces outside such as pavement and parking lot to allow patient to walk into church.    Baseline  12/5: does not walk outside 2/24: unable to test due to rain 5/26: unable since COVID 6/17: raining    Time  8    Period  Weeks    Status  On-going    Target Date  11/26/18            Plan - 11/06/18 1112    Clinical Impression Statement  Patient overheating today resulting in full body shaking requiring seated interventions throughout session. She experienced L shoulder pain with transfers that was relieved with pectoral and anterior muscle prolonged hold lengthening in varying planes of stretch. The patient will continue to benefit from skilled therapeutic intervention to address deficits in strength, mobility, balance, and function for improved overall QOL and safety    Rehab Potential  Good    PT Frequency  2x / week    PT Duration  8 weeks    PT Treatment/Interventions  ADLs/Self Care Home Management;Aquatic Therapy;Biofeedback;Cryotherapy;Electrical Stimulation;Iontophoresis '4mg'$ /ml Dexamethasone;Traction;Moist Heat;Ultrasound;DME Instruction;Gait training;Stair training;Functional mobility training;Therapeutic activities;Therapeutic exercise;Balance training;Neuromuscular re-education;Patient/family education;Orthotic Fit/Training;Wheelchair mobility training;Manual techniques;Compression bandaging;Passive range of motion;Dry needling;Energy conservation;Taping;Splinting    PT  Next Visit Plan  strength/balance    PT Home Exercise Plan  to initiate next session (please incorporate hip flexor and adductor stretches in HEP if possible)    Consulted and Agree with Plan of Care  Patient       Patient will benefit from skilled therapeutic intervention in order to improve the following deficits and  impairments:  Abnormal gait, Decreased activity tolerance, Decreased balance, Decreased coordination, Decreased endurance, Decreased knowledge of use of DME, Decreased mobility, Decreased range of motion, Decreased safety awareness, Decreased strength, Difficulty walking, Impaired perceived functional ability, Impaired sensation, Impaired tone, Impaired UE functional use, Improper body mechanics, Postural dysfunction  Visit Diagnosis: 1. Muscle weakness (generalized)   2. Other abnormalities of gait and mobility   3. Unsteadiness on feet        Problem List Patient Active Problem List   Diagnosis Date Noted  . Abscess of female pelvis   . SVT (supraventricular tachycardia) ([place])   . Radial styloid tenosynovitis 03/12/2018  . Wheelchair confinement 02/27/2018  . Localized osteoporosis with current pathological fracture with routine healing 01/19/2017  . Wrist fracture 01/16/2017  . Sprain of ankle 03/23/2016  . Closed fracture of lateral malleolus 03/16/2016  . Health care maintenance 01/24/2016  . Blood pressure elevated without history of HTN 10/25/2015  . Essential hypertension 10/25/2015  . Multiple sclerosis ([place]) 10/02/2015  . Chronic left shoulder pain 07/19/2015  . Multiple sclerosis exacerbation ([place]) 07/14/2015  . MS (multiple sclerosis) ([place]) 11/26/2014  . Increased body mass index 11/26/2014  . HPV test positive 11/26/2014  . Status post laparoscopic supracervical hysterectomy 11/26/2014  . Galactorrhea 11/26/2014  . Back ache 05/21/2014  . Adiposity 05/21/2014  . Disordered sleep 05/21/2014  . Muscle spasticity 05/21/2014  . Spasticity 05/21/2014   . Calculus of kidney 12/09/2013  . Renal colic 67/28/9791  . Hypercholesteremia 08/19/2013  . Hereditary and idiopathic neuropathy 08/19/2013  . Hypercholesterolemia without hypertriglyceridemia 08/19/2013  . Bladder infection, chronic 07/25/2012  . Disorder of bladder function 07/25/2012  . Incomplete bladder emptying 07/25/2012  . Microscopic hematuria 07/25/2012  . Right upper quadrant pain 07/25/2012   Janna Arch, PT, DPT   11/06/2018, 11:13 AM  [place] [facility] SERVICES [address] [place], [place], [address] Phone: [phone]   Fax:  [phone]  Name: Lisa Williams MRN: [id number] Date of Birth: [birth year]

## 2018-11-11 ENCOUNTER — Other Ambulatory Visit: Payer: Self-pay

## 2018-11-11 ENCOUNTER — Ambulatory Visit: Payer: Medicare HMO

## 2018-11-11 DIAGNOSIS — R2689 Other abnormalities of gait and mobility: Secondary | ICD-10-CM

## 2018-11-11 DIAGNOSIS — M6281 Muscle weakness (generalized): Secondary | ICD-10-CM | POA: Diagnosis not present

## 2018-11-11 DIAGNOSIS — R2681 Unsteadiness on feet: Secondary | ICD-10-CM

## 2018-11-11 NOTE — Therapy (Signed)
[place] MAIN [facility] SERVICES [address] [place], [place], [address] Phone: [phone]   Fax:  [phone]  Physical Therapy Treatment  Patient Details  Name: Lisa Williams MRN: [id number] Date of Birth: 01/30/1961 Referring Provider (PT): Gurney Maxin, MD   Encounter Date: 11/11/2018  PT End of Session - 11/11/18 1021    Visit Number  95    Number of Visits  99    Date for PT Re-Evaluation  11/26/18    PT Start Time  8315    PT Stop Time  1113    PT Time Calculation (min)  44 min    Equipment Utilized During Treatment  Gait belt    Activity Tolerance  Patient tolerated treatment well;Other (comment)   heat intolerance and L shoulder pain   Behavior During Therapy  WFL for tasks assessed/performed       Past Medical History:  Diagnosis Date  . Abdominal pain, right upper quadrant   . Back pain   . Calculus of kidney 12/09/2013  . Chronic back pain    unspecified  . Chronic left shoulder pain 07/19/2015  . Functional disorder of bladder    other  . Galactorrhea 11/26/2014   Chronic   . Hereditary and idiopathic neuropathy 08/19/2013  . HPV test positive   . Hypercholesteremia 08/19/2013  . Incomplete bladder emptying   . Microscopic hematuria   . MS (multiple sclerosis) ([place])   . Muscle spasticity 05/21/2014  . Nonspecific findings on examination of urine    other  . Osteopenia   . Status post laparoscopic supracervical hysterectomy 11/26/2014  . Tobacco user 11/26/2014  . Wrist fracture     Past Surgical History:  Procedure Laterality Date  . bilateral tubal ligation  1996  . BREAST CYST EXCISION Left 2002  . KNEE SURGERY     right  . LAPAROSCOPIC SUPRACERVICAL HYSTERECTOMY  08/05/2013  . ORIF WRIST FRACTURE Left 01/17/2017   Procedure: OPEN REDUCTION INTERNAL FIXATION (ORIF) WRIST FRACTURE;  Surgeon: Lovell Sheehan, MD;  Location: [facility] ORS;  Service: Orthopedics;  Laterality: Left;  . TUBAL LIGATION Bilateral    . VAGINAL HYSTERECTOMY  03/2006    There were no vitals filed for this visit.  Subjective Assessment - 11/11/18 1216    Subjective  Patient reports she had a good fourth of july. Was able to assist in cooking but her mom has to help take things out of the oven. Has been practicing her standing balance.    Pertinent History  Pt is a 58 y/o F who presents with BLE weakness and difficulty ambulating due to h/o MS that was diagnosed in 1995. Pt denies any pain associated. Pt was admitted to the hospital in November 2018 due to MS exacerbation. Nov-Dec to HHPT-->[facility] until end of Jan. After rehab the pt was able to ambulate up to 100 ft with the RW. Pt has Bil AFO, her therapist at [facility] thinks she needs a new AFO as her feet still drag when she ambulates, pt reports HHPT mentioned a spring AFO. Pt has custom AFOs that were originally made 4 years ago. Goals: improve balance, to be able to get into the shower with her tub bench without assist, to be able to get up 2 steps at sister's home with railings on both sides but too far apart to reach both sides, to be able to drive adapted car. Pt is staying at other sister's house with one step to get inside  and currently picking up L leg with UEs and then stepping in. Pt steps down leading with LLE. Pt wants to work on walking side to side as this is challenging and she had not yet progressed to this at rehab. Pt able to get in/out the car with assist only to bring BLEs into the car. Hoping to drive by June or July with adapted car (already has one). Pt needs assist bringing LEs into and out of hospital bed. Pt able to perform sit>stand without assist from [facility] with a pillow on the seat for a boost. Pt denies any falls in the past 6 months. Pt has been increasing her time alone at home up to a few hours at a time to become more independent. Pt has both manual and power WC. Uses power WC at home, uses manual WC out in community. Pt able to self propel  manual WC. Pt needs assist putting pants around ankles but she can pull them up. Pt can put on her shoes but needs help getting her leg up to do so. Pt able to tie her shoelaces on her own. Pt has a tub/shower unit at home with a tub bench. No grab bars in the shower. Has hand held shower head. Pt currently taking a sponge bath. Pt has a regular height toilet with a BSC over top. Pt with h/o L wrist fx with no precautions, pt wore her soft brace to evaluation for comfort but reports her wrist has healed and is doing well following OT.     Currently in Pain?  No/denies          Treatment:   Ambulate 40 ft with RW and CGa with w/c follow. Verbal cueing for sequencing, anterior pelvic tilt, weight shift, and foot clearance.Decreasing L foot clearance due to fatigue.   Standing:  -anterior posterior pelvic tilts in standing with RW 12x, very challenging to patient  -static stand with reaches for SUE support, able to perform with no UE support intermittently for short durations ~5-6 seconds prior to having to touch again with R hand.    Seated: 3000 Gr weighted ball:                          Circles : clockwise 15x, counterclockwise 15x straight arm   Straight arm forward raises 15x   Ball toss with PT inside and outside BOS for pertubation's to stability        Seated modified arnolds for posterior shoulder activation/upright posture 12x; tactile assistance for maintaining neutral position.   Seated LAQ modified 10x  Isometrics:   isometric adduction against PT hand 10x 3 second holds  Isometric abduction against PT hand 10x 3 second holds  Isometric hip flexion against PT hand 10x 3 second holds     Pt educated throughout session about proper posture and technique with exercises. Improved exercise technique, movement at target joints, use of target muscles after minverbal, visual, tactile cues  Seated  rest breaks and use of ice pack utilized to keep body temperature at functional range to reduce MS exacerbation.                         PT Education - 11/11/18 1020    Education provided  Yes    Education Details  exercise technique,stability, Geophysicist/field seismologist) Educated  Patient    Methods  Explanation;Demonstration;Tactile cues;Verbal cues    Comprehension  Verbalized understanding;Returned demonstration;Verbal cues required;Tactile cues required;Need further instruction       PT Short Term Goals - 10/23/18 1111      PT SHORT TERM GOAL #1   Title  Patient will be compliant with HEP 4-5 days/week for improved carryover between sessions.     Baseline  HEP compliancy between sessions 6/17 compliant    Time  2    Period  Weeks    Status  Achieved    Target Date  10/15/18      PT SHORT TERM GOAL #2   Title  Patient will report no falls/LOB to indicate improved stability with mobility     Baseline  6/17: no falls, one episode of instability    Time  2    Period  Weeks    Status  Partially Met    Target Date  11/06/18        PT Long Term Goals - 10/23/18 0001      PT LONG TERM GOAL #1   Title  Patient will perform TUG in 40 seconds or less with walker to improve safe negotiation of environment.     Baseline  5/26: 61 seconds 6/17: 60 seconds    Time  8    Period  Weeks    Status  Partially Met    Target Date  11/26/18      PT LONG TERM GOAL #2   Title  Pt's 2mT will improve to at least 0.5 m/s for improved safety ambulating in home    Baseline  0.14 m/s; 5/15: .280m  7/10: .36 m/s  8/20: .42 m/s  9/26: 0.2670m10/14: .26 m/s improved hip and knee clearance 11/15: 0.25m46mmproved hip and knee clearance; 12/5: .29 m/s improved step length and forward moment: decreased foot drag. 05/09/2018: 0.21 m/s 2/6: .237 m/s 5/26: .23 m/s 6/17: .24 m/s     Time  8    Period  Weeks    Status  Partially Met    Target Date  11/26/18      PT LONG TERM  GOAL #3   Title  Pt's Bil hip F strength will improve to at least 2+/5 BLEs for improved gait mechanics and safety    Baseline  L: 2/5, R: 1/5 7/10: 2/5 bilaterally  8/20: 2/5 bilat 9/26: 2/5 bilat 10/14: 2/5 bilaterally ,improved hip flexor muscle activation 2/5: improved hip flexor and quad activation: 12/5: 2/5 ; 05/09/2018: 2/5 2/6: 3/5 knee, 2 to 2+/5 hip 5/26: 2-/5 hips 3/5 knees 6/17: 2-/5 hips 3/5 knees    Time  8    Period  Weeks    Status  Partially Met    Target Date  11/26/18      PT LONG TERM GOAL #4   Title  Pt's ABC Scale will improve to at least 40% to demonstrate improved balance    Baseline  28.13% 5/15; 24.4% 7/10: 23.4% 8/20: 22. 2% 9/26: 25.3% 10/14: 26.5% 11/15: 24.38% 12/5: 26%; 05/09/2018: 29% 2/6: 23.75% 526: 30% 6/17: 26.8%     Time  8    Period  Weeks    Status  Partially Met    Target Date  11/26/18      PT LONG TERM GOAL #5   Title  Patient will negotiate one step ~4 inches to enter house independently to increase functional independence in natural environment    Baseline  /20: unable to do 9/26: patient too fatigued/not feeling well to perform 10/14: unable to perform at  this time 11/15: unable to perform at this time; 12/5: able to lift 1 inch; 05/09/2018: unable to clear 4" step, able to lift foot ~ 1in 5/26: has been unable to perform 6/17: able to lift foot 1.25 inch    Time  8    Period  Weeks    Status  On-going    Target Date  11/26/18      PT LONG TERM GOAL #6   Title  Pt will be able to perform tub bench into/out of shower transfer without assist for improved independence with this task    Baseline  challenged with steps at this time, getting better, stands and gives sponge bath independently now  9/26: has not tried transfer yet 11/15: has not attempted yet; 05/09/2018: not attempted 5/26: able to do one time but needs a new shower chair    Time  8    Period  Weeks    Status  Partially Met    Target Date  11/26/18      PT LONG TERM GOAL #7   Title   Patient will stand 5 minutes to increase standing duration to allow patient to return to washing dishes and clothes.     Baseline  12/5: 56 seconds; 05/09/2018: 1 min 18 sec 2/6: 2 minutes last session 5/26: 1 minute 36 seconds limited by breathing 6/17: terminated due to abdomen pain     Time  8    Period  Weeks    Status  Partially Met    Target Date  11/26/18      PT LONG TERM GOAL #8   Title  Patient will negotiate 30 ft of changing surfaces outside such as pavement and parking lot to allow patient to walk into church.    Baseline  12/5: does not walk outside 2/24: unable to test due to rain 5/26: unable since COVID 6/17: raining    Time  8    Period  Weeks    Status  On-going    Target Date  11/26/18            Plan - 11/11/18 1234    Clinical Impression Statement  Patient demonstrated improved static stability with reaches without UE support for short durations. Patient has noticeable posterior shoulder/back weakness with preference for anterior muscle recruitment with stability/transfers. The patient will continue to benefit from skilled therapeutic intervention to address deficits in strength, mobility, balance, and function for improved overall QOL and safety    Rehab Potential  Good    PT Frequency  2x / week    PT Duration  8 weeks    PT Treatment/Interventions  ADLs/Self Care Home Management;Aquatic Therapy;Biofeedback;Cryotherapy;Electrical Stimulation;Iontophoresis 78m/ml Dexamethasone;Traction;Moist Heat;Ultrasound;DME Instruction;Gait training;Stair training;Functional mobility training;Therapeutic activities;Therapeutic exercise;Balance training;Neuromuscular re-education;Patient/family education;Orthotic Fit/Training;Wheelchair mobility training;Manual techniques;Compression bandaging;Passive range of motion;Dry needling;Energy conservation;Taping;Splinting    PT Next Visit Plan  strength/balance    PT Home Exercise Plan  to initiate next session (please incorporate hip  flexor and adductor stretches in HEP if possible)    Consulted and Agree with Plan of Care  Patient       Patient will benefit from skilled therapeutic intervention in order to improve the following deficits and impairments:  Abnormal gait, Decreased activity tolerance, Decreased balance, Decreased coordination, Decreased endurance, Decreased knowledge of use of DME, Decreased mobility, Decreased range of motion, Decreased safety awareness, Decreased strength, Difficulty walking, Impaired perceived functional ability, Impaired sensation, Impaired tone, Impaired UE functional use, Improper body mechanics, Postural dysfunction  Visit  Diagnosis: 1. Muscle weakness (generalized)   2. Other abnormalities of gait and mobility   3. Unsteadiness on feet        Problem List Patient Active Problem List   Diagnosis Date Noted  . Abscess of female pelvis   . SVT (supraventricular tachycardia) ([place])   . Radial styloid tenosynovitis 03/12/2018  . Wheelchair confinement 02/27/2018  . Localized osteoporosis with current pathological fracture with routine healing 01/19/2017  . Wrist fracture 01/16/2017  . Sprain of ankle 03/23/2016  . Closed fracture of lateral malleolus 03/16/2016  . Health care maintenance 01/24/2016  . Blood pressure elevated without history of HTN 10/25/2015  . Essential hypertension 10/25/2015  . Multiple sclerosis ([place]) 10/02/2015  . Chronic left shoulder pain 07/19/2015  . Multiple sclerosis exacerbation ([place]) 07/14/2015  . MS (multiple sclerosis) ([place]) 11/26/2014  . Increased body mass index 11/26/2014  . HPV test positive 11/26/2014  . Status post laparoscopic supracervical hysterectomy 11/26/2014  . Galactorrhea 11/26/2014  . Back ache 05/21/2014  . Adiposity 05/21/2014  . Disordered sleep 05/21/2014  . Muscle spasticity 05/21/2014  . Spasticity 05/21/2014  . Calculus of kidney 12/09/2013  . Renal colic 47/84/1282  . Hypercholesteremia 08/19/2013  . Hereditary  and idiopathic neuropathy 08/19/2013  . Hypercholesterolemia without hypertriglyceridemia 08/19/2013  . Bladder infection, chronic 07/25/2012  . Disorder of bladder function 07/25/2012  . Incomplete bladder emptying 07/25/2012  . Microscopic hematuria 07/25/2012  . Right upper quadrant pain 07/25/2012   Janna Arch, PT, DPT   11/11/2018, 12:35 PM  [place] MAIN [facility] SERVICES [address] [place], [place], [address] Phone: [phone]   Fax:  [phone]  Name: Lisa Williams MRN: [id number] Date of Birth: May 27, 1960

## 2018-11-13 ENCOUNTER — Ambulatory Visit: Payer: Medicare HMO

## 2018-11-13 ENCOUNTER — Other Ambulatory Visit: Payer: Self-pay

## 2018-11-13 DIAGNOSIS — R2689 Other abnormalities of gait and mobility: Secondary | ICD-10-CM

## 2018-11-13 DIAGNOSIS — R2681 Unsteadiness on feet: Secondary | ICD-10-CM

## 2018-11-13 DIAGNOSIS — M6281 Muscle weakness (generalized): Secondary | ICD-10-CM

## 2018-11-13 NOTE — Therapy (Signed)
[place] MAIN [facility] SERVICES [address] [place], [place], [address] Phone: [phone]   Fax:  [phone]  Physical Therapy Treatment  Patient Details  Name: Lisa Williams MRN: [id number] Date of Birth: Sep 15, 1960 Referring Provider (PT): Gurney Maxin, MD   Encounter Date: 11/13/2018  PT End of Session - 11/13/18 1026    Visit Number  96    Number of Visits  99    Date for PT Re-Evaluation  11/26/18    PT Start Time  1028    PT Stop Time  1113    PT Time Calculation (min)  45 min    Equipment Utilized During Treatment  Gait belt    Activity Tolerance  Patient tolerated treatment well;Other (comment)   heat intolerance and L shoulder pain   Behavior During Therapy  WFL for tasks assessed/performed       Past Medical History:  Diagnosis Date  . Abdominal pain, right upper quadrant   . Back pain   . Calculus of kidney 12/09/2013  . Chronic back pain    unspecified  . Chronic left shoulder pain 07/19/2015  . Functional disorder of bladder    other  . Galactorrhea 11/26/2014   Chronic   . Hereditary and idiopathic neuropathy 08/19/2013  . HPV test positive   . Hypercholesteremia 08/19/2013  . Incomplete bladder emptying   . Microscopic hematuria   . MS (multiple sclerosis) ([place])   . Muscle spasticity 05/21/2014  . Nonspecific findings on examination of urine    other  . Osteopenia   . Status post laparoscopic supracervical hysterectomy 11/26/2014  . Tobacco user 11/26/2014  . Wrist fracture     Past Surgical History:  Procedure Laterality Date  . bilateral tubal ligation  1996  . BREAST CYST EXCISION Left 2002  . KNEE SURGERY     right  . LAPAROSCOPIC SUPRACERVICAL HYSTERECTOMY  08/05/2013  . ORIF WRIST FRACTURE Left 01/17/2017   Procedure: OPEN REDUCTION INTERNAL FIXATION (ORIF) WRIST FRACTURE;  Surgeon: Lovell Sheehan, MD;  Location: [facility] ORS;  Service: Orthopedics;  Laterality: Left;  . TUBAL LIGATION Bilateral    . VAGINAL HYSTERECTOMY  03/2006    There were no vitals filed for this visit.  Subjective Assessment - 11/13/18 1523    Subjective  Patient reports slight discomfort in L wrist since yesterday, no pain just discomfort. Wearing sleeve for it. Has been compliant with HEP and practicing at home.    Pertinent History  Pt is a 58 y/o F who presents with BLE weakness and difficulty ambulating due to h/o MS that was diagnosed in 1995. Pt denies any pain associated. Pt was admitted to the hospital in November 2018 due to MS exacerbation. Nov-Dec to HHPT-->[facility] until end of Jan. After rehab the pt was able to ambulate up to 100 ft with the RW. Pt has Bil AFO, her therapist at [facility] thinks she needs a new AFO as her feet still drag when she ambulates, pt reports HHPT mentioned a spring AFO. Pt has custom AFOs that were originally made 4 years ago. Goals: improve balance, to be able to get into the shower with her tub bench without assist, to be able to get up 2 steps at sister's home with railings on both sides but too far apart to reach both sides, to be able to drive adapted car. Pt is staying at other sister's house with one step to get inside and currently picking up L leg with  UEs and then stepping in. Pt steps down leading with LLE. Pt wants to work on walking side to side as this is challenging and she had not yet progressed to this at rehab. Pt able to get in/out the car with assist only to bring BLEs into the car. Hoping to drive by June or July with adapted car (already has one). Pt needs assist bringing LEs into and out of hospital bed. Pt able to perform sit>stand without assist from [facility] with a pillow on the seat for a boost. Pt denies any falls in the past 6 months. Pt has been increasing her time alone at home up to a few hours at a time to become more independent. Pt has both manual and power WC. Uses power WC at home, uses manual WC out in community. Pt able to self propel manual WC. Pt  needs assist putting pants around ankles but she can pull them up. Pt can put on her shoes but needs help getting her leg up to do so. Pt able to tie her shoelaces on her own. Pt has a tub/shower unit at home with a tub bench. No grab bars in the shower. Has hand held shower head. Pt currently taking a sponge bath. Pt has a regular height toilet with a BSC over top. Pt with h/o L wrist fx with no precautions, pt wore her soft brace to evaluation for comfort but reports her wrist has healed and is doing well following OT.     Currently in Pain?  No/denies       Treatment:      Standing:   -anterior posterior pelvic tilts in standing with RW 12x, very challenging to patient    -ambulate 10 ft around cone and back to chair (addiitonal 10 ft) for 20 ft total x 3 trials with RW and CGA., more challenging to the L. x2 trials; very tired.   Step over meter stick laying down 5x each LE; improved step over LLE than RLE ; very fatiguing, requiring seated rest break between each LE           Seated modified arnolds for posterior shoulder activation/upright posture 12x; tactile assistance for maintaining neutral position.     Seated LAQ modified 10x    Isometrics:                          isometric adduction against PT hand 10x 3 second holds               Isometric abduction against PT hand 10x 3 second holds               Isometric hamstring curl against PT hand 10x 3 second holds         Pt educated throughout session about proper posture and technique with exercises. Improved exercise technique, movement at target joints, use of target muscles after min verbal, visual, tactile cues   Seated rest breaks and use of ice pack utilized to keep body temperature at functional range to reduce MS exacerbation.                        PT Education - 11/13/18 1026    Education provided  Yes    Education Details  exercise technique, coordination, body mechanics    Person(s)  Educated  Patient    Methods  Explanation;Demonstration;Tactile cues;Verbal cues    Comprehension  Verbalized understanding;Returned demonstration;Verbal  cues required;Tactile cues required       PT Short Term Goals - 10/23/18 1111      PT SHORT TERM GOAL #1   Title  Patient will be compliant with HEP 4-5 days/week for improved carryover between sessions.     Baseline  HEP compliancy between sessions 6/17 compliant    Time  2    Period  Weeks    Status  Achieved    Target Date  10/15/18      PT SHORT TERM GOAL #2   Title  Patient will report no falls/LOB to indicate improved stability with mobility     Baseline  6/17: no falls, one episode of instability    Time  2    Period  Weeks    Status  Partially Met    Target Date  11/06/18        PT Long Term Goals - 10/23/18 0001      PT LONG TERM GOAL #1   Title  Patient will perform TUG in 40 seconds or less with walker to improve safe negotiation of environment.     Baseline  5/26: 61 seconds 6/17: 60 seconds    Time  8    Period  Weeks    Status  Partially Met    Target Date  11/26/18      PT LONG TERM GOAL #2   Title  Pt's 98mT will improve to at least 0.5 m/s for improved safety ambulating in home    Baseline  0.14 m/s; 5/15: .234m  7/10: .36 m/s  8/20: .42 m/s  9/26: 0.2666m10/14: .26 m/s improved hip and knee clearance 11/15: 0.43m60mmproved hip and knee clearance; 12/5: .29 m/s improved step length and forward moment: decreased foot drag. 05/09/2018: 0.21 m/s 2/6: .237 m/s 5/26: .23 m/s 6/17: .24 m/s     Time  8    Period  Weeks    Status  Partially Met    Target Date  11/26/18      PT LONG TERM GOAL #3   Title  Pt's Bil hip F strength will improve to at least 2+/5 BLEs for improved gait mechanics and safety    Baseline  L: 2/5, R: 1/5 7/10: 2/5 bilaterally  8/20: 2/5 bilat 9/26: 2/5 bilat 10/14: 2/5 bilaterally ,improved hip flexor muscle activation 2/5: improved hip flexor and quad activation: 12/5: 2/5 ; 05/09/2018:  2/5 2/6: 3/5 knee, 2 to 2+/5 hip 5/26: 2-/5 hips 3/5 knees 6/17: 2-/5 hips 3/5 knees    Time  8    Period  Weeks    Status  Partially Met    Target Date  11/26/18      PT LONG TERM GOAL #4   Title  Pt's ABC Scale will improve to at least 40% to demonstrate improved balance    Baseline  28.13% 5/15; 24.4% 7/10: 23.4% 8/20: 22. 2% 9/26: 25.3% 10/14: 26.5% 11/15: 24.38% 12/5: 26%; 05/09/2018: 29% 2/6: 23.75% 526: 30% 6/17: 26.8%     Time  8    Period  Weeks    Status  Partially Met    Target Date  11/26/18      PT LONG TERM GOAL #5   Title  Patient will negotiate one step ~4 inches to enter house independently to increase functional independence in natural environment    Baseline  /20: unable to do 9/26: patient too fatigued/not feeling well to perform 10/14: unable to perform at this time 11/15: unable to  perform at this time; 12/5: able to lift 1 inch; 05/09/2018: unable to clear 4" step, able to lift foot ~ 1in 5/26: has been unable to perform 6/17: able to lift foot 1.25 inch    Time  8    Period  Weeks    Status  On-going    Target Date  11/26/18      PT LONG TERM GOAL #6   Title  Pt will be able to perform tub bench into/out of shower transfer without assist for improved independence with this task    Baseline  challenged with steps at this time, getting better, stands and gives sponge bath independently now  9/26: has not tried transfer yet 11/15: has not attempted yet; 05/09/2018: not attempted 5/26: able to do one time but needs a new shower chair    Time  8    Period  Weeks    Status  Partially Met    Target Date  11/26/18      PT LONG TERM GOAL #7   Title  Patient will stand 5 minutes to increase standing duration to allow patient to return to washing dishes and clothes.     Baseline  12/5: 56 seconds; 05/09/2018: 1 min 18 sec 2/6: 2 minutes last session 5/26: 1 minute 36 seconds limited by breathing 6/17: terminated due to abdomen pain     Time  8    Period  Weeks    Status   Partially Met    Target Date  11/26/18      PT LONG TERM GOAL #8   Title  Patient will negotiate 30 ft of changing surfaces outside such as pavement and parking lot to allow patient to walk into church.    Baseline  12/5: does not walk outside 2/24: unable to test due to rain 5/26: unable since COVID 6/17: raining    Time  8    Period  Weeks    Status  On-going    Target Date  11/26/18            Plan - 11/13/18 1527    Clinical Impression Statement  Patient is progressing with ability to negotiate obstacles when ambulating as well as clear limb with stepping. She continues to fatigue quickly requiring frequent seated rest breaks with use of ice to maintain neutral body temperature due to diagnosis of MS. Patient is more challenged with turning to the left and stepping over obstacles with the right LE. The patient will continue to benefit from skilled therapeutic intervention to address deficits in strength, mobility, balance, and function for improved overall QOL and safety    Rehab Potential  Good    PT Frequency  2x / week    PT Duration  8 weeks    PT Treatment/Interventions  ADLs/Self Care Home Management;Aquatic Therapy;Biofeedback;Cryotherapy;Electrical Stimulation;Iontophoresis 20m/ml Dexamethasone;Traction;Moist Heat;Ultrasound;DME Instruction;Gait training;Stair training;Functional mobility training;Therapeutic activities;Therapeutic exercise;Balance training;Neuromuscular re-education;Patient/family education;Orthotic Fit/Training;Wheelchair mobility training;Manual techniques;Compression bandaging;Passive range of motion;Dry needling;Energy conservation;Taping;Splinting    PT Next Visit Plan  strength/balance    PT Home Exercise Plan  to initiate next session (please incorporate hip flexor and adductor stretches in HEP if possible)    Consulted and Agree with Plan of Care  Patient       Patient will benefit from skilled therapeutic intervention in order to improve the  following deficits and impairments:  Abnormal gait, Decreased activity tolerance, Decreased balance, Decreased coordination, Decreased endurance, Decreased knowledge of use of DME, Decreased mobility, Decreased range of motion,  Decreased safety awareness, Decreased strength, Difficulty walking, Impaired perceived functional ability, Impaired sensation, Impaired tone, Impaired UE functional use, Improper body mechanics, Postural dysfunction  Visit Diagnosis: 1. Muscle weakness (generalized)   2. Other abnormalities of gait and mobility   3. Unsteadiness on feet        Problem List Patient Active Problem List   Diagnosis Date Noted  . Abscess of female pelvis   . SVT (supraventricular tachycardia) ([place])   . Radial styloid tenosynovitis 03/12/2018  . Wheelchair confinement 02/27/2018  . Localized osteoporosis with current pathological fracture with routine healing 01/19/2017  . Wrist fracture 01/16/2017  . Sprain of ankle 03/23/2016  . Closed fracture of lateral malleolus 03/16/2016  . Health care maintenance 01/24/2016  . Blood pressure elevated without history of HTN 10/25/2015  . Essential hypertension 10/25/2015  . Multiple sclerosis ([place]) 10/02/2015  . Chronic left shoulder pain 07/19/2015  . Multiple sclerosis exacerbation ([place]) 07/14/2015  . MS (multiple sclerosis) ([place]) 11/26/2014  . Increased body mass index 11/26/2014  . HPV test positive 11/26/2014  . Status post laparoscopic supracervical hysterectomy 11/26/2014  . Galactorrhea 11/26/2014  . Back ache 05/21/2014  . Adiposity 05/21/2014  . Disordered sleep 05/21/2014  . Muscle spasticity 05/21/2014  . Spasticity 05/21/2014  . Calculus of kidney 12/09/2013  . Renal colic 17/61/6073  . Hypercholesteremia 08/19/2013  . Hereditary and idiopathic neuropathy 08/19/2013  . Hypercholesterolemia without hypertriglyceridemia 08/19/2013  . Bladder infection, chronic 07/25/2012  . Disorder of bladder function 07/25/2012  .  Incomplete bladder emptying 07/25/2012  . Microscopic hematuria 07/25/2012  . Right upper quadrant pain 07/25/2012   Janna Arch, PT, DPT    11/13/2018, 3:28 PM  [place] MAIN [facility] SERVICES [address] [place], [place], [address] Phone: [phone]   Fax:  [phone]  Name: Lisa Williams MRN: [id number] Date of Birth: May 29, 1960

## 2018-11-18 ENCOUNTER — Other Ambulatory Visit: Payer: Self-pay

## 2018-11-18 ENCOUNTER — Ambulatory Visit: Payer: Medicare HMO

## 2018-11-18 DIAGNOSIS — M6281 Muscle weakness (generalized): Secondary | ICD-10-CM | POA: Diagnosis not present

## 2018-11-18 DIAGNOSIS — R2689 Other abnormalities of gait and mobility: Secondary | ICD-10-CM

## 2018-11-18 DIAGNOSIS — R2681 Unsteadiness on feet: Secondary | ICD-10-CM

## 2018-11-18 NOTE — Therapy (Signed)
[place] MAIN [facility] SERVICES [address] [place], [place], [address] Phone: [phone]   Fax:  [phone]  Physical Therapy Treatment  Patient Details  Name: Lisa Williams MRN: [id number] Date of Birth: 1961/02/28 Referring Provider (PT): Gurney Maxin, MD   Encounter Date: 11/18/2018  PT End of Session - 11/18/18 3220    Visit Number  [id number]    Number of Visits  99    Date for PT Re-Evaluation  11/26/18    PT Start Time  1030    PT Stop Time  1114    PT Time Calculation (min)  44 min    Equipment Utilized During Treatment  Gait belt    Activity Tolerance  Patient tolerated treatment well;Other (comment)   heat intolerance and L shoulder pain   Behavior During Therapy  WFL for tasks assessed/performed       Past Medical History:  Diagnosis Date  . Abdominal pain, right upper quadrant   . Back pain   . Calculus of kidney 12/09/2013  . Chronic back pain    unspecified  . Chronic left shoulder pain 07/19/2015  . Functional disorder of bladder    other  . Galactorrhea 11/26/2014   Chronic   . Hereditary and idiopathic neuropathy 08/19/2013  . HPV test positive   . Hypercholesteremia 08/19/2013  . Incomplete bladder emptying   . Microscopic hematuria   . MS (multiple sclerosis) ([place])   . Muscle spasticity 05/21/2014  . Nonspecific findings on examination of urine    other  . Osteopenia   . Status post laparoscopic supracervical hysterectomy 11/26/2014  . Tobacco user 11/26/2014  . Wrist fracture     Past Surgical History:  Procedure Laterality Date  . bilateral tubal ligation  1996  . BREAST CYST EXCISION Left 2002  . KNEE SURGERY     right  . LAPAROSCOPIC SUPRACERVICAL HYSTERECTOMY  08/05/2013  . ORIF WRIST FRACTURE Left 01/17/2017   Procedure: OPEN REDUCTION INTERNAL FIXATION (ORIF) WRIST FRACTURE;  Surgeon: Lovell Sheehan, MD;  Location: [facility] ORS;  Service: Orthopedics;  Laterality: Left;  . TUBAL LIGATION  Bilateral   . VAGINAL HYSTERECTOMY  03/2006    There were no vitals filed for this visit.  Subjective Assessment - 11/18/18 1240    Subjective  Patient reports no pain or falls since last session. Has been compliant with HEP.    Pertinent History  Pt is a 58 y/o F who presents with BLE weakness and difficulty ambulating due to h/o MS that was diagnosed in 1995. Pt denies any pain associated. Pt was admitted to the hospital in November 2018 due to MS exacerbation. Nov-Dec to HHPT-->[facility] until end of Jan. After rehab the pt was able to ambulate up to 100 ft with the RW. Pt has Bil AFO, her therapist at [place] thinks she needs a new AFO as her feet still drag when she ambulates, pt reports HHPT mentioned a spring AFO. Pt has custom AFOs that were originally made 4 years ago. Goals: improve balance, to be able to get into the shower with her tub bench without assist, to be able to get up 2 steps at sister's home with railings on both sides but too far apart to reach both sides, to be able to drive adapted car. Pt is staying at other sister's house with one step to get inside and currently picking up L leg with UEs and then stepping in. Pt steps down leading with LLE. Pt  wants to work on walking side to side as this is challenging and she had not yet progressed to this at rehab. Pt able to get in/out the car with assist only to bring BLEs into the car. Hoping to drive by June or July with adapted car (already has one). Pt needs assist bringing LEs into and out of hospital bed. Pt able to perform sit>stand without assist from [facility] with a pillow on the seat for a boost. Pt denies any falls in the past 6 months. Pt has been increasing her time alone at home up to a few hours at a time to become more independent. Pt has both manual and power WC. Uses power WC at home, uses manual WC out in community. Pt able to self propel manual WC. Pt needs assist putting pants around ankles but she can pull them up. Pt  can put on her shoes but needs help getting her leg up to do so. Pt able to tie her shoelaces on her own. Pt has a tub/shower unit at home with a tub bench. No grab bars in the shower. Has hand held shower head. Pt currently taking a sponge bath. Pt has a regular height toilet with a BSC over top. Pt with h/o L wrist fx with no precautions, pt wore her soft brace to evaluation for comfort but reports her wrist has healed and is doing well following OT.     Currently in Pain?  No/denies           Treatment:      Standing:   -anterior posterior pelvic tilts in standing with RW 12x, very challenging to patient     -ambulate negotiating two cones (there and back) 20 ft distance, seated rest break between each set. CGA with RW. ' 2 trials      Seated LAQ modified 10x    Isometrics:                          isometric adduction against PT hand 10x 3 second holds               Isometric abduction against PT hand 10x 3 second holds               Isometric hamstring curl against PT hand 10x 3 second holds    balloon taps inside and outside BOS 2 minutes reaching inside and outside BOS     weighted ball (3000 gr) Chest press with back unsupported 15x Upright press with back unsupported, TrA activation  15x Ball toss 15x   Pt educated throughout session about proper posture and technique with exercises. Improved exercise technique, movement at target joints, use of target muscles after min verbal, visual, tactile cues   Seated rest breaks and use of ice pack utilized to keep body temperature at functional range to reduce MS exacerbation.                      PT Education - 11/18/18 1241    Education provided  Yes    Education Details  exercise technique, coordination, body mechanics    Person(s) Educated  Patient    Methods  Explanation;Demonstration;Tactile cues;Verbal cues    Comprehension  Verbalized understanding;Returned demonstration;Verbal cues  required;Tactile cues required       PT Short Term Goals - 10/23/18 1111      PT SHORT TERM GOAL #1   Title  Patient will be  compliant with HEP 4-5 days/week for improved carryover between sessions.     Baseline  HEP compliancy between sessions 6/17 compliant    Time  2    Period  Weeks    Status  Achieved    Target Date  10/15/18      PT SHORT TERM GOAL #2   Title  Patient will report no falls/LOB to indicate improved stability with mobility     Baseline  6/17: no falls, one episode of instability    Time  2    Period  Weeks    Status  Partially Met    Target Date  11/06/18        PT Long Term Goals - 10/23/18 0001      PT LONG TERM GOAL #1   Title  Patient will perform TUG in 40 seconds or less with walker to improve safe negotiation of environment.     Baseline  5/26: 61 seconds 6/17: 60 seconds    Time  8    Period  Weeks    Status  Partially Met    Target Date  11/26/18      PT LONG TERM GOAL #2   Title  Pt's 21mT will improve to at least 0.5 m/s for improved safety ambulating in home    Baseline  0.14 m/s; 5/15: .268m  7/10: .36 m/s  8/20: .42 m/s  9/26: 0.2662m10/14: .26 m/s improved hip and knee clearance 11/15: 0.53m25mmproved hip and knee clearance; 12/5: .29 m/s improved step length and forward moment: decreased foot drag. 05/09/2018: 0.21 m/s 2/6: .237 m/s 5/26: .23 m/s 6/17: .24 m/s     Time  8    Period  Weeks    Status  Partially Met    Target Date  11/26/18      PT LONG TERM GOAL #3   Title  Pt's Bil hip F strength will improve to at least 2+/5 BLEs for improved gait mechanics and safety    Baseline  L: 2/5, R: 1/5 7/10: 2/5 bilaterally  8/20: 2/5 bilat 9/26: 2/5 bilat 10/14: 2/5 bilaterally ,improved hip flexor muscle activation 2/5: improved hip flexor and quad activation: 12/5: 2/5 ; 05/09/2018: 2/5 2/6: 3/5 knee, 2 to 2+/5 hip 5/26: 2-/5 hips 3/5 knees 6/17: 2-/5 hips 3/5 knees    Time  8    Period  Weeks    Status  Partially Met    Target Date   11/26/18      PT LONG TERM GOAL #4   Title  Pt's ABC Scale will improve to at least 40% to demonstrate improved balance    Baseline  28.13% 5/15; 24.4% 7/10: 23.4% 8/20: 22. 2% 9/26: 25.3% 10/14: 26.5% 11/15: 24.38% 12/5: 26%; 05/09/2018: 29% 2/6: 23.75% 526: 30% 6/17: 26.8%     Time  8    Period  Weeks    Status  Partially Met    Target Date  11/26/18      PT LONG TERM GOAL #5   Title  Patient will negotiate one step ~4 inches to enter house independently to increase functional independence in natural environment    Baseline  /20: unable to do 9/26: patient too fatigued/not feeling well to perform 10/14: unable to perform at this time 11/15: unable to perform at this time; 12/5: able to lift 1 inch; 05/09/2018: unable to clear 4" step, able to lift foot ~ 1in 5/26: has been unable to perform 6/17: able to lift foot 1.25  inch    Time  8    Period  Weeks    Status  On-going    Target Date  11/26/18      PT LONG TERM GOAL #6   Title  Pt will be able to perform tub bench into/out of shower transfer without assist for improved independence with this task    Baseline  challenged with steps at this time, getting better, stands and gives sponge bath independently now  9/26: has not tried transfer yet 11/15: has not attempted yet; 05/09/2018: not attempted 5/26: able to do one time but needs a new shower chair    Time  8    Period  Weeks    Status  Partially Met    Target Date  11/26/18      PT LONG TERM GOAL #7   Title  Patient will stand 5 minutes to increase standing duration to allow patient to return to washing dishes and clothes.     Baseline  12/5: 56 seconds; 05/09/2018: 1 min 18 sec 2/6: 2 minutes last session 5/26: 1 minute 36 seconds limited by breathing 6/17: terminated due to abdomen pain     Time  8    Period  Weeks    Status  Partially Met    Target Date  11/26/18      PT LONG TERM GOAL #8   Title  Patient will negotiate 30 ft of changing surfaces outside such as pavement and  parking lot to allow patient to walk into church.    Baseline  12/5: does not walk outside 2/24: unable to test due to rain 5/26: unable since COVID 6/17: raining    Time  8    Period  Weeks    Status  On-going    Target Date  11/26/18            Plan - 11/18/18 1245    Clinical Impression Statement  Patient progressing to negotiate more obstacles with dynamic stability. She continues to fatigue quickly requiring frequent seated rest breaks with use of ice to maintain neutral body temperature due to diagnosis of MS. The patient will continue to benefit from skilled therapeutic intervention to address deficits in strength, mobility, balance, and function for improved overall QOL and safety    Rehab Potential  Good    PT Frequency  2x / week    PT Duration  8 weeks    PT Treatment/Interventions  ADLs/Self Care Home Management;Aquatic Therapy;Biofeedback;Cryotherapy;Electrical Stimulation;Iontophoresis 64m/ml Dexamethasone;Traction;Moist Heat;Ultrasound;DME Instruction;Gait training;Stair training;Functional mobility training;Therapeutic activities;Therapeutic exercise;Balance training;Neuromuscular re-education;Patient/family education;Orthotic Fit/Training;Wheelchair mobility training;Manual techniques;Compression bandaging;Passive range of motion;Dry needling;Energy conservation;Taping;Splinting    PT Next Visit Plan  strength/balance    PT Home Exercise Plan  to initiate next session (please incorporate hip flexor and adductor stretches in HEP if possible)    Consulted and Agree with Plan of Care  Patient       Patient will benefit from skilled therapeutic intervention in order to improve the following deficits and impairments:  Abnormal gait, Decreased activity tolerance, Decreased balance, Decreased coordination, Decreased endurance, Decreased knowledge of use of DME, Decreased mobility, Decreased range of motion, Decreased safety awareness, Decreased strength, Difficulty walking,  Impaired perceived functional ability, Impaired sensation, Impaired tone, Impaired UE functional use, Improper body mechanics, Postural dysfunction  Visit Diagnosis: 1. Muscle weakness (generalized)   2. Other abnormalities of gait and mobility   3. Unsteadiness on feet        Problem List Patient Active Problem List  Diagnosis Date Noted  . Abscess of female pelvis   . SVT (supraventricular tachycardia) ([place])   . Radial styloid tenosynovitis 03/12/2018  . Wheelchair confinement 02/27/2018  . Localized osteoporosis with current pathological fracture with routine healing 01/19/2017  . Wrist fracture 01/16/2017  . Sprain of ankle 03/23/2016  . Closed fracture of lateral malleolus 03/16/2016  . Health care maintenance 01/24/2016  . Blood pressure elevated without history of HTN 10/25/2015  . Essential hypertension 10/25/2015  . Multiple sclerosis ([place]) 10/02/2015  . Chronic left shoulder pain 07/19/2015  . Multiple sclerosis exacerbation ([place]) 07/14/2015  . MS (multiple sclerosis) ([place]) 11/26/2014  . Increased body mass index 11/26/2014  . HPV test positive 11/26/2014  . Status post laparoscopic supracervical hysterectomy 11/26/2014  . Galactorrhea 11/26/2014  . Back ache 05/21/2014  . Adiposity 05/21/2014  . Disordered sleep 05/21/2014  . Muscle spasticity 05/21/2014  . Spasticity 05/21/2014  . Calculus of kidney 12/09/2013  . Renal colic 82/01/9067  . Hypercholesteremia 08/19/2013  . Hereditary and idiopathic neuropathy 08/19/2013  . Hypercholesterolemia without hypertriglyceridemia 08/19/2013  . Bladder infection, chronic 07/25/2012  . Disorder of bladder function 07/25/2012  . Incomplete bladder emptying 07/25/2012  . Microscopic hematuria 07/25/2012  . Right upper quadrant pain 07/25/2012   Janna Arch, PT, DPT   11/18/2018, 12:46 PM  [place] MAIN [facility] SERVICES [address] [place], [place], [address] Phone:  [phone]   Fax:  [phone]  Name: Lisa Williams MRN: [id number] Date of Birth: 1960-09-19

## 2018-11-20 ENCOUNTER — Other Ambulatory Visit: Payer: Self-pay

## 2018-11-20 ENCOUNTER — Ambulatory Visit: Payer: Medicare HMO

## 2018-11-20 DIAGNOSIS — R2689 Other abnormalities of gait and mobility: Secondary | ICD-10-CM

## 2018-11-20 DIAGNOSIS — M6281 Muscle weakness (generalized): Secondary | ICD-10-CM

## 2018-11-20 DIAGNOSIS — R2681 Unsteadiness on feet: Secondary | ICD-10-CM

## 2018-11-20 NOTE — Therapy (Signed)
[place] [facility] SERVICES [address] [place], [place], [address] Phone: [phone]   Fax:  [phone]  Physical Therapy Treatment  Patient Details  Name: Lisa Williams MRN: [id number] Date of Birth: Feb 12, 1961 Referring Provider (PT): Gurney Maxin, MD   Encounter Date: 11/20/2018  PT End of Session - 11/20/18 1250    Visit Number  98    Number of Visits  99    Date for PT Re-Evaluation  11/26/18    PT Start Time  0102    PT Stop Time  1111    PT Time Calculation (min)  41 min    Equipment Utilized During Treatment  Gait belt    Activity Tolerance  Patient tolerated treatment well;Other (comment)   heat intolerance and L shoulder pain   Behavior During Therapy  WFL for tasks assessed/performed       Past Medical History:  Diagnosis Date  . Abdominal pain, right upper quadrant   . Back pain   . Calculus of kidney 12/09/2013  . Chronic back pain    unspecified  . Chronic left shoulder pain 07/19/2015  . Functional disorder of bladder    other  . Galactorrhea 11/26/2014   Chronic   . Hereditary and idiopathic neuropathy 08/19/2013  . HPV test positive   . Hypercholesteremia 08/19/2013  . Incomplete bladder emptying   . Microscopic hematuria   . MS (multiple sclerosis) ([place])   . Muscle spasticity 05/21/2014  . Nonspecific findings on examination of urine    other  . Osteopenia   . Status post laparoscopic supracervical hysterectomy 11/26/2014  . Tobacco user 11/26/2014  . Wrist fracture     Past Surgical History:  Procedure Laterality Date  . bilateral tubal ligation  1996  . BREAST CYST EXCISION Left 2002  . KNEE SURGERY     right  . LAPAROSCOPIC SUPRACERVICAL HYSTERECTOMY  08/05/2013  . ORIF WRIST FRACTURE Left 01/17/2017   Procedure: OPEN REDUCTION INTERNAL FIXATION (ORIF) WRIST FRACTURE;  Surgeon: Lovell Sheehan, MD;  Location: [facility] ORS;  Service: Orthopedics;  Laterality: Left;  . TUBAL LIGATION  Bilateral   . VAGINAL HYSTERECTOMY  03/2006    There were no vitals filed for this visit.  Subjective Assessment - 11/20/18 1113    Subjective  Patient reports she has been practicing her walking in her house but has been too hot to go out too often. Compliant with HEP, no falls since last session.    Pertinent History  Pt is a 57 y/o F who presents with BLE weakness and difficulty ambulating due to h/o MS that was diagnosed in 1995. Pt denies any pain associated. Pt was admitted to the hospital in November 2018 due to MS exacerbation. Nov-Dec to HHPT-->[facility] until end of Jan. After rehab the pt was able to ambulate up to 100 ft with the RW. Pt has Bil AFO, her therapist at [facility] thinks she needs a new AFO as her feet still drag when she ambulates, pt reports HHPT mentioned a spring AFO. Pt has custom AFOs that were originally made 4 years ago. Goals: improve balance, to be able to get into the shower with her tub bench without assist, to be able to get up 2 steps at sister's home with railings on both sides but too far apart to reach both sides, to be able to drive adapted car. Pt is staying at other sister's house with one step to get inside and currently picking up  L leg with UEs and then stepping in. Pt steps down leading with LLE. Pt wants to work on walking side to side as this is challenging and she had not yet progressed to this at rehab. Pt able to get in/out the car with assist only to bring BLEs into the car. Hoping to drive by June or July with adapted car (already has one). Pt needs assist bringing LEs into and out of hospital bed. Pt able to perform sit>stand without assist from [facility] with a pillow on the seat for a boost. Pt denies any falls in the past 6 months. Pt has been increasing her time alone at home up to a few hours at a time to become more independent. Pt has both manual and power WC. Uses power WC at home, uses manual WC out in community. Pt able to self propel manual WC.  Pt needs assist putting pants around ankles but she can pull them up. Pt can put on her shoes but needs help getting her leg up to do so. Pt able to tie her shoelaces on her own. Pt has a tub/shower unit at home with a tub bench. No grab bars in the shower. Has hand held shower head. Pt currently taking a sponge bath. Pt has a regular height toilet with a BSC over top. Pt with h/o L wrist fx with no precautions, pt wore her soft brace to evaluation for comfort but reports her wrist has healed and is doing well following OT.     Currently in Pain?  No/denies        Treatment:  Ambulate 22 ft wit RW and CGA in hallway. Good foot clearance bilaterally with verbal cueing for weight shift and foot clearance with wheelchair follow. Only two episodes of foot scuffing.    trigger point with muscle lengthening L forearm for reduction of soreness of wrist  x3 minutes.   5lb bar;  Seated row with scapular retraction 15x cueing for muscle activation and sequencing  straight arm raise 15x; back unsupported for increased trunk stability   Modified LAQ 10x each LE seated ; visual and verbal cueing for arc of movement  windmill (modified)5x each side; very fatiguing to patient   balloon taps reaching inside and outside BOS with trunk unsupported 3x minutes.   Seated adduction squeezes 10x 3 second    Pt educated throughout session about proper posture and technique with exercises. Improved exercise technique, movement at target joints, use of target muscles after min verbal, visual, tactile cues   Seated rest breaks and use of ice pack utilized to keep body temperature at functional range to reduce MS exacerbation.             PT Education - 11/20/18 1114    Education provided  Yes    Education Details  exercise technique, body mechanics    Person(s) Educated  Patient    Methods  Explanation;Demonstration;Tactile cues;Verbal cues    Comprehension  Verbalized understanding;Returned  demonstration;Verbal cues required;Tactile cues required       PT Short Term Goals - 10/23/18 1111      PT SHORT TERM GOAL #1   Title  Patient will be compliant with HEP 4-5 days/week for improved carryover between sessions.     Baseline  HEP compliancy between sessions 6/17 compliant    Time  2    Period  Weeks    Status  Achieved    Target Date  10/15/18      PT  SHORT TERM GOAL #2   Title  Patient will report no falls/LOB to indicate improved stability with mobility     Baseline  6/17: no falls, one episode of instability    Time  2    Period  Weeks    Status  Partially Met    Target Date  11/06/18        PT Long Term Goals - 10/23/18 0001      PT LONG TERM GOAL #1   Title  Patient will perform TUG in 40 seconds or less with walker to improve safe negotiation of environment.     Baseline  5/26: 61 seconds 6/17: 60 seconds    Time  8    Period  Weeks    Status  Partially Met    Target Date  11/26/18      PT LONG TERM GOAL #2   Title  Pt's 15mT will improve to at least 0.5 m/s for improved safety ambulating in home    Baseline  0.14 m/s; 5/15: .270m  7/10: .36 m/s  8/20: .42 m/s  9/26: 0.2631m10/14: .26 m/s improved hip and knee clearance 11/15: 0.40m25mmproved hip and knee clearance; 12/5: .29 m/s improved step length and forward moment: decreased foot drag. 05/09/2018: 0.21 m/s 2/6: .237 m/s 5/26: .23 m/s 6/17: .24 m/s     Time  8    Period  Weeks    Status  Partially Met    Target Date  11/26/18      PT LONG TERM GOAL #3   Title  Pt's Bil hip F strength will improve to at least 2+/5 BLEs for improved gait mechanics and safety    Baseline  L: 2/5, R: 1/5 7/10: 2/5 bilaterally  8/20: 2/5 bilat 9/26: 2/5 bilat 10/14: 2/5 bilaterally ,improved hip flexor muscle activation 2/5: improved hip flexor and quad activation: 12/5: 2/5 ; 05/09/2018: 2/5 2/6: 3/5 knee, 2 to 2+/5 hip 5/26: 2-/5 hips 3/5 knees 6/17: 2-/5 hips 3/5 knees    Time  8    Period  Weeks    Status   Partially Met    Target Date  11/26/18      PT LONG TERM GOAL #4   Title  Pt's ABC Scale will improve to at least 40% to demonstrate improved balance    Baseline  28.13% 5/15; 24.4% 7/10: 23.4% 8/20: 22. 2% 9/26: 25.3% 10/14: 26.5% 11/15: 24.38% 12/5: 26%; 05/09/2018: 29% 2/6: 23.75% 526: 30% 6/17: 26.8%     Time  8    Period  Weeks    Status  Partially Met    Target Date  11/26/18      PT LONG TERM GOAL #5   Title  Patient will negotiate one step ~4 inches to enter house independently to increase functional independence in natural environment    Baseline  /20: unable to do 9/26: patient too fatigued/not feeling well to perform 10/14: unable to perform at this time 11/15: unable to perform at this time; 12/5: able to lift 1 inch; 05/09/2018: unable to clear 4" step, able to lift foot ~ 1in 5/26: has been unable to perform 6/17: able to lift foot 1.25 inch    Time  8    Period  Weeks    Status  On-going    Target Date  11/26/18      PT LONG TERM GOAL #6   Title  Pt will be able to perform tub bench into/out of shower transfer without  assist for improved independence with this task    Baseline  challenged with steps at this time, getting better, stands and gives sponge bath independently now  9/26: has not tried transfer yet 11/15: has not attempted yet; 05/09/2018: not attempted 5/26: able to do one time but needs a new shower chair    Time  8    Period  Weeks    Status  Partially Met    Target Date  11/26/18      PT LONG TERM GOAL #7   Title  Patient will stand 5 minutes to increase standing duration to allow patient to return to washing dishes and clothes.     Baseline  12/5: 56 seconds; 05/09/2018: 1 min 18 sec 2/6: 2 minutes last session 5/26: 1 minute 36 seconds limited by breathing 6/17: terminated due to abdomen pain     Time  8    Period  Weeks    Status  Partially Met    Target Date  11/26/18      PT LONG TERM GOAL #8   Title  Patient will negotiate 30 ft of changing surfaces  outside such as pavement and parking lot to allow patient to walk into church.    Baseline  12/5: does not walk outside 2/24: unable to test due to rain 5/26: unable since COVID 6/17: raining    Time  8    Period  Weeks    Status  On-going    Target Date  11/26/18            Plan - 11/20/18 1255    Clinical Impression Statement  Patient progressing with functional ambulation, demonstrating ability to clear feet with increasing velocity and duration of mobility. Patient requires rest breaks with use of ice pack to reduce internal body temperature from diagnosis to allow for continuation of interventions. The patient will continue to benefit from skilled therapeutic intervention to address deficits in strength, mobility, balance, and function for improved overall QOL and safety    Rehab Potential  Good    PT Frequency  2x / week    PT Duration  8 weeks    PT Treatment/Interventions  ADLs/Self Care Home Management;Aquatic Therapy;Biofeedback;Cryotherapy;Electrical Stimulation;Iontophoresis 40m/ml Dexamethasone;Traction;Moist Heat;Ultrasound;DME Instruction;Gait training;Stair training;Functional mobility training;Therapeutic activities;Therapeutic exercise;Balance training;Neuromuscular re-education;Patient/family education;Orthotic Fit/Training;Wheelchair mobility training;Manual techniques;Compression bandaging;Passive range of motion;Dry needling;Energy conservation;Taping;Splinting    PT Next Visit Plan  strength/balance    PT Home Exercise Plan  to initiate next session (please incorporate hip flexor and adductor stretches in HEP if possible)    Consulted and Agree with Plan of Care  Patient       Patient will benefit from skilled therapeutic intervention in order to improve the following deficits and impairments:  Abnormal gait, Decreased activity tolerance, Decreased balance, Decreased coordination, Decreased endurance, Decreased knowledge of use of DME, Decreased mobility, Decreased  range of motion, Decreased safety awareness, Decreased strength, Difficulty walking, Impaired perceived functional ability, Impaired sensation, Impaired tone, Impaired UE functional use, Improper body mechanics, Postural dysfunction  Visit Diagnosis: 1. Muscle weakness (generalized)   2. Other abnormalities of gait and mobility   3. Unsteadiness on feet        Problem List Patient Active Problem List   Diagnosis Date Noted  . Abscess of female pelvis   . SVT (supraventricular tachycardia) ([place]   . Radial styloid tenosynovitis 03/12/2018  . Wheelchair confinement 02/27/2018  . Localized osteoporosis with current pathological fracture with routine healing 01/19/2017  . Wrist fracture 01/16/2017  .  Sprain of ankle 03/23/2016  . Closed fracture of lateral malleolus 03/16/2016  . Health care maintenance 01/24/2016  . Blood pressure elevated without history of HTN 10/25/2015  . Essential hypertension 10/25/2015  . Multiple sclerosis ([place]) 10/02/2015  . Chronic left shoulder pain 07/19/2015  . Multiple sclerosis exacerbation ([place]) 07/14/2015  . MS (multiple sclerosis) ([place]) 11/26/2014  . Increased body mass index 11/26/2014  . HPV test positive 11/26/2014  . Status post laparoscopic supracervical hysterectomy 11/26/2014  . Galactorrhea 11/26/2014  . Back ache 05/21/2014  . Adiposity 05/21/2014  . Disordered sleep 05/21/2014  . Muscle spasticity 05/21/2014  . Spasticity 05/21/2014  . Calculus of kidney 12/09/2013  . Renal colic 61/96/9409  . Hypercholesteremia 08/19/2013  . Hereditary and idiopathic neuropathy 08/19/2013  . Hypercholesterolemia without hypertriglyceridemia 08/19/2013  . Bladder infection, chronic 07/25/2012  . Disorder of bladder function 07/25/2012  . Incomplete bladder emptying 07/25/2012  . Microscopic hematuria 07/25/2012  . Right upper quadrant pain 07/25/2012   Janna Arch, PT, DPT   11/20/2018, 12:55 PM  [place] MAIN [facility] SERVICES [address] [place], [place], [address] Phone: [phone]   Fax:  [phone]  Name: Lisa Williams MRN: [id number] Date of Birth: 04/15/1961

## 2018-11-25 ENCOUNTER — Ambulatory Visit: Payer: Medicare HMO

## 2018-11-25 ENCOUNTER — Other Ambulatory Visit: Payer: Self-pay

## 2018-11-25 DIAGNOSIS — R2681 Unsteadiness on feet: Secondary | ICD-10-CM

## 2018-11-25 DIAGNOSIS — M6281 Muscle weakness (generalized): Secondary | ICD-10-CM

## 2018-11-25 DIAGNOSIS — R2689 Other abnormalities of gait and mobility: Secondary | ICD-10-CM

## 2018-11-25 NOTE — Therapy (Signed)
[place] MAIN [facility] SERVICES [address] [place], [place], [address] Phone: [phone]   Fax:  [phone]  Physical Therapy Treatment/ RECERT  Patient Details  Name: Lisa Williams MRN: [id number] Date of Birth: February 07, 1961 Referring Provider (PT): Gurney Maxin, MD   Encounter Date: 11/25/2018  PT End of Session - 11/25/18 1251    Visit Number  99    Number of Visits  115    Date for PT Re-Evaluation  01/20/19    PT Start Time  1030    PT Stop Time  1110    PT Time Calculation (min)  40 min    Equipment Utilized During Treatment  Gait belt    Activity Tolerance  Patient tolerated treatment well;Other (comment)   heat intolerance and L shoulder pain   Behavior During Therapy  WFL for tasks assessed/performed       Past Medical History:  Diagnosis Date  . Abdominal pain, right upper quadrant   . Back pain   . Calculus of kidney 12/09/2013  . Chronic back pain    unspecified  . Chronic left shoulder pain 07/19/2015  . Functional disorder of bladder    other  . Galactorrhea 11/26/2014   Chronic   . Hereditary and idiopathic neuropathy 08/19/2013  . HPV test positive   . Hypercholesteremia 08/19/2013  . Incomplete bladder emptying   . Microscopic hematuria   . MS (multiple sclerosis) ([place])   . Muscle spasticity 05/21/2014  . Nonspecific findings on examination of urine    other  . Osteopenia   . Status post laparoscopic supracervical hysterectomy 11/26/2014  . Tobacco user 11/26/2014  . Wrist fracture     Past Surgical History:  Procedure Laterality Date  . bilateral tubal ligation  1996  . BREAST CYST EXCISION Left 2002  . KNEE SURGERY     right  . LAPAROSCOPIC SUPRACERVICAL HYSTERECTOMY  08/05/2013  . ORIF WRIST FRACTURE Left 01/17/2017   Procedure: OPEN REDUCTION INTERNAL FIXATION (ORIF) WRIST FRACTURE;  Surgeon: Lovell Sheehan, MD;  Location: [facility] ORS;  Service: Orthopedics;  Laterality: Left;  . TUBAL LIGATION  Bilateral   . VAGINAL HYSTERECTOMY  03/2006    There were no vitals filed for this visit.  Subjective Assessment - 11/25/18 1249    Subjective  Patient reports she hasn't been able to go outside due to heat. Has been compliant with HEP with no falls or LOB since last session.    Pertinent History  Pt is a 58 y/o F who presents with BLE weakness and difficulty ambulating due to h/o MS that was diagnosed in 1995. Pt denies any pain associated. Pt was admitted to the hospital in November 2018 due to MS exacerbation. Nov-Dec to HHPT-->[facility] until end of Jan. After rehab the pt was able to ambulate up to 100 ft with the RW. Pt has Bil AFO, her therapist at [facility] thinks she needs a new AFO as her feet still drag when she ambulates, pt reports HHPT mentioned a spring AFO. Pt has custom AFOs that were originally made 4 years ago. Goals: improve balance, to be able to get into the shower with her tub bench without assist, to be able to get up 2 steps at sister's home with railings on both sides but too far apart to reach both sides, to be able to drive adapted car. Pt is staying at other sister's house with one step to get inside and currently picking up L leg with  UEs and then stepping in. Pt steps down leading with LLE. Pt wants to work on walking side to side as this is challenging and she had not yet progressed to this at rehab. Pt able to get in/out the car with assist only to bring BLEs into the car. Hoping to drive by June or July with adapted car (already has one). Pt needs assist bringing LEs into and out of hospital bed. Pt able to perform sit>stand without assist from [facility] with a pillow on the seat for a boost. Pt denies any falls in the past 6 months. Pt has been increasing her time alone at home up to a few hours at a time to become more independent. Pt has both manual and power WC. Uses power WC at home, uses manual WC out in community. Pt able to self propel manual WC. Pt needs assist  putting pants around ankles but she can pull them up. Pt can put on her shoes but needs help getting her leg up to do so. Pt able to tie her shoelaces on her own. Pt has a tub/shower unit at home with a tub bench. No grab bars in the shower. Has hand held shower head. Pt currently taking a sponge bath. Pt has a regular height toilet with a BSC over top. Pt with h/o L wrist fx with no precautions, pt wore her soft brace to evaluation for comfort but reports her wrist has healed and is doing well following OT.     Currently in Pain?  No/denies         Goals: last done 6/17:   TUG: 59 seconds 10 MWT: =0.26 m/s   ABC=28% Negotiate 1 step Stand 5 minutes: 1 min 58 seconds, terminated due to heat.  Negotiate 30 ft of changing surfaces outside  Treatment:   Modified LAQ 10x each LE seated ; visual and verbal cueing for arc of movement  windmill (modified)5x each side; very fatiguing to patient   balloon taps reaching inside and outside BOS with trunk unsupported 3x minutes.     Patient is progressing with speed of ambulation, progressing to halfway towards goal speed of 0.21ms. Even with increased temperature of the summer time which affects the patient's strength and mobility patient is demonstrating progress towards goals.  She continues to fatigue quickly requiring frequent seated rest breaks with use of ice to maintain neutral body temperature due to diagnosis of MS. The patient will continue to benefit from skilled therapeutic intervention to address deficits in strength, mobility, balance, and function for improved overall QOL and safety              PT Education - 11/25/18 1250    Education provided  Yes    Education Details  goals, POC    Person(s) Educated  Patient    Methods  Explanation;Demonstration    Comprehension  Verbalized understanding;Returned demonstration       PT Short Term Goals - 11/25/18 1249      PT SHORT TERM GOAL #1   Title  Patient will be  compliant with HEP 4-5 days/week for improved carryover between sessions.     Baseline  HEP compliancy between sessions 6/17 compliant    Time  2    Period  Weeks    Status  Achieved    Target Date  10/15/18      PT SHORT TERM GOAL #2   Title  Patient will report no falls/LOB to indicate improved stability with mobility  Baseline  6/17: no falls, one episode of instability 7/20: no falls    Time  2    Period  Weeks    Status  Achieved    Target Date  11/06/18        PT Long Term Goals - 11/25/18 0001      PT LONG TERM GOAL #1   Title  Patient will perform TUG in 40 seconds or less with walker to improve safe negotiation of environment.     Baseline  5/26: 61 seconds 6/17: 60 seconds 7/20: 59 seconds    Time  8    Period  Weeks    Status  Partially Met    Target Date  01/20/19      PT LONG TERM GOAL #2   Title  Pt's 31mT will improve to at least 0.5 m/s for improved safety ambulating in home    Baseline  0.14 m/s; 5/15: .213m  7/10: .36 m/s  8/20: .42 m/s  9/26: 0.2691m10/14: .26 m/s improved hip and knee clearance 11/15: 0.13m108mmproved hip and knee clearance; 12/5: .29 m/s improved step length and forward moment: decreased foot drag. 05/09/2018: 0.21 m/s 2/6: .237 m/s 5/26: .23 m/s 6/17: .24 m/s  7/20: 0.26 m/s    Time  8    Period  Weeks    Status  Partially Met    Target Date  01/20/19      PT LONG TERM GOAL #3   Title  Pt's Bil hip F strength will improve to at least 2+/5 BLEs for improved gait mechanics and safety    Baseline  L: 2/5, R: 1/5 7/10: 2/5 bilaterally  8/20: 2/5 bilat 9/26: 2/5 bilat 10/14: 2/5 bilaterally ,improved hip flexor muscle activation 2/5: improved hip flexor and quad activation: 12/5: 2/5 ; 05/09/2018: 2/5 2/6: 3/5 knee, 2 to 2+/5 hip 5/26: 2-/5 hips 3/5 knees 6/17: 2-/5 hips 3/5 knees    Time  8    Period  Weeks    Status  Partially Met    Target Date  01/20/19      PT LONG TERM GOAL #4   Title  Pt's ABC Scale will improve to at least 40%  to demonstrate improved balance    Baseline  28.13% 5/15; 24.4% 7/10: 23.4% 8/20: 22. 2% 9/26: 25.3% 10/14: 26.5% 11/15: 24.38% 12/5: 26%; 05/09/2018: 29% 2/6: 23.75% 526: 30% 6/17: 26.8%  7/2: 28%    Time  8    Period  Weeks    Status  Partially Met    Target Date  01/20/19      PT LONG TERM GOAL #5   Title  Patient will negotiate one step ~4 inches to enter house independently to increase functional independence in natural environment    Baseline  /20: unable to do 9/26: patient too fatigued/not feeling well to perform 10/14: unable to perform at this time 11/15: unable to perform at this time; 12/5: able to lift 1 inch; 05/09/2018: unable to clear 4" step, able to lift foot ~ 1in 5/26: has been unable to perform 6/17: able to lift foot 1.25 inch    Time  8    Period  Weeks    Status  On-going    Target Date  01/20/19      PT LONG TERM GOAL #7   Title  Patient will stand 5 minutes to increase standing duration to allow patient to return to washing dishes and clothes.     Baseline  12/5:  56 seconds; 05/09/2018: 1 min 18 sec 2/6: 2 minutes last session 5/26: 1 minute 36 seconds limited by breathing 6/17: terminated due to abdomen pain  7/20 terminated due to overheating    Time  8    Period  Weeks    Status  Partially Met    Target Date  01/20/19      PT LONG TERM GOAL #8   Title  Patient will negotiate 30 ft of changing surfaces outside such as pavement and parking lot to allow patient to walk into church.    Baseline  12/5: does not walk outside 2/24: unable to test due to rain 5/26: unable since COVID 6/17: raining 7/20 too hot to test    Time  8    Period  Weeks    Status  On-going    Target Date  01/20/19            Plan - 11/25/18 1253    Clinical Impression Statement  Patient is progressing with speed of ambulation, progressing to halfway towards goal speed of 0.35ms. Even with increased temperature of the summer time which affects the patient's strength and mobility patient  is demonstrating progress towards goals.  She continues to fatigue quickly requiring frequent seated rest breaks with use of ice to maintain neutral body temperature due to diagnosis of MS. The patient will continue to benefit from skilled therapeutic intervention to address deficits in strength, mobility, balance, and function for improved overall QOL and safety    Rehab Potential  Good    PT Frequency  2x / week    PT Duration  8 weeks    PT Treatment/Interventions  ADLs/Self Care Home Management;Aquatic Therapy;Biofeedback;Cryotherapy;Electrical Stimulation;Iontophoresis 447mml Dexamethasone;Traction;Moist Heat;Ultrasound;DME Instruction;Gait training;Stair training;Functional mobility training;Therapeutic activities;Therapeutic exercise;Balance training;Neuromuscular re-education;Patient/family education;Orthotic Fit/Training;Wheelchair mobility training;Manual techniques;Compression bandaging;Passive range of motion;Dry needling;Energy conservation;Taping;Splinting    PT Next Visit Plan  strength/balance    PT Home Exercise Plan  to initiate next session (please incorporate hip flexor and adductor stretches in HEP if possible)    Consulted and Agree with Plan of Care  Patient       Patient will benefit from skilled therapeutic intervention in order to improve the following deficits and impairments:  Abnormal gait, Decreased activity tolerance, Decreased balance, Decreased coordination, Decreased endurance, Decreased knowledge of use of DME, Decreased mobility, Decreased range of motion, Decreased safety awareness, Decreased strength, Difficulty walking, Impaired perceived functional ability, Impaired sensation, Impaired tone, Impaired UE functional use, Improper body mechanics, Postural dysfunction  Visit Diagnosis: 1. Muscle weakness (generalized)   2. Other abnormalities of gait and mobility   3. Unsteadiness on feet        Problem List Patient Active Problem List   Diagnosis Date  Noted  . Abscess of female pelvis   . SVT (supraventricular tachycardia) ([facility]  . Radial styloid tenosynovitis 03/12/2018  . Wheelchair confinement 02/27/2018  . Localized osteoporosis with current pathological fracture with routine healing 01/19/2017  . Wrist fracture 01/16/2017  . Sprain of ankle 03/23/2016  . Closed fracture of lateral malleolus 03/16/2016  . Health care maintenance 01/24/2016  . Blood pressure elevated without history of HTN 10/25/2015  . Essential hypertension 10/25/2015  . Multiple sclerosis ([facility]05/27/2017  . Chronic left shoulder pain 07/19/2015  . Multiple sclerosis exacerbation ([facility]03/12/2015  . MS (multiple sclerosis) ([facility]07/21/2016  . Increased body mass index 11/26/2014  . HPV test positive 11/26/2014  . Status post laparoscopic supracervical hysterectomy 11/26/2014  . Galactorrhea 11/26/2014  .  Back ache 05/21/2014  . Adiposity 05/21/2014  . Disordered sleep 05/21/2014  . Muscle spasticity 05/21/2014  . Spasticity 05/21/2014  . Calculus of kidney 12/09/2013  . Renal colic 01/28/3006  . Hypercholesteremia 08/19/2013  . Hereditary and idiopathic neuropathy 08/19/2013  . Hypercholesterolemia without hypertriglyceridemia 08/19/2013  . Bladder infection, chronic 07/25/2012  . Disorder of bladder function 07/25/2012  . Incomplete bladder emptying 07/25/2012  . Microscopic hematuria 07/25/2012  . Right upper quadrant pain 07/25/2012   Janna Arch, PT, DPT   11/25/2018, 12:54 PM  [place] MAIN [facility] SERVICES [address] [place], [place], [address] Phone: [phone]   Fax:  [phone]  Name: Lisa Williams MRN: [id number] Date of Birth: Jun 28, 1960

## 2018-11-27 ENCOUNTER — Other Ambulatory Visit: Payer: Self-pay

## 2018-11-27 ENCOUNTER — Ambulatory Visit: Payer: Medicare HMO

## 2018-11-27 DIAGNOSIS — M6281 Muscle weakness (generalized): Secondary | ICD-10-CM | POA: Diagnosis not present

## 2018-11-27 DIAGNOSIS — R2681 Unsteadiness on feet: Secondary | ICD-10-CM

## 2018-11-27 DIAGNOSIS — R2689 Other abnormalities of gait and mobility: Secondary | ICD-10-CM

## 2018-11-27 NOTE — Therapy (Signed)
[place] [facility] [place], [place], [address] Phone: [phone]   Fax:  [phone]  Physical Therapy Treatment Physical Therapy Progress Note   Dates of reporting period  10/23/18  to   11/27/18  Patient Details  Name: Lisa Williams MRN: [id number] Date of Birth: 11/30/1960 Referring Provider (PT): Gurney Maxin, MD   Encounter Date: 11/27/2018  PT End of Session - 11/27/18 1250    Visit Number  100    Number of Visits  115    Date for PT Re-Evaluation  01/20/19    Authorization Type  10/10 next 1/10 PN start 7/22/    PT Start Time  1030    PT Stop Time  1111    PT Time Calculation (min)  41 min    Equipment Utilized During Treatment  Gait belt    Activity Tolerance  Patient tolerated treatment well;Other (comment)   heat intolerance and L shoulder pain   Behavior During Therapy  WFL for tasks assessed/performed       Past Medical History:  Diagnosis Date  . Abdominal pain, right upper quadrant   . Back pain   . Calculus of kidney 12/09/2013  . Chronic back pain    unspecified  . Chronic left shoulder pain 07/19/2015  . Functional disorder of bladder    other  . Galactorrhea 11/26/2014   Chronic   . Hereditary and idiopathic neuropathy 08/19/2013  . HPV test positive   . Hypercholesteremia 08/19/2013  . Incomplete bladder emptying   . Microscopic hematuria   . MS (multiple sclerosis) ([place])   . Muscle spasticity 05/21/2014  . Nonspecific findings on examination of urine    other  . Osteopenia   . Status post laparoscopic supracervical hysterectomy 11/26/2014  . Tobacco user 11/26/2014  . Wrist fracture     Past Surgical History:  Procedure Laterality Date  . bilateral tubal ligation  1996  . BREAST CYST EXCISION Left 2002  . KNEE SURGERY     right  . LAPAROSCOPIC SUPRACERVICAL HYSTERECTOMY  08/05/2013  . ORIF WRIST FRACTURE Left 01/17/2017   Procedure: OPEN REDUCTION INTERNAL FIXATION  (ORIF) WRIST FRACTURE;  Surgeon: Lovell Sheehan, MD;  Location: [facility] ORS;  Service: Orthopedics;  Laterality: Left;  . TUBAL LIGATION Bilateral   . VAGINAL HYSTERECTOMY  03/2006    There were no vitals filed for this visit.  Subjective Assessment - 11/27/18 1029    Subjective  Patient wore her neck ice collar to reduce her body temperature per PT request. Compliant with HEP no falls or LOB since last session.    Pertinent History  Pt is a 58 y/o F who presents with BLE weakness and difficulty ambulating due to h/o MS that was diagnosed in 1995. Pt denies any pain associated. Pt was admitted to the hospital in November 2018 due to MS exacerbation. Nov-Dec to HHPT-->[facility] until end of Jan. After rehab the pt was able to ambulate up to 100 ft with the RW. Pt has Bil AFO, her therapist at [place] thinks she needs a new AFO as her feet still drag when she ambulates, pt reports HHPT mentioned a spring AFO. Pt has custom AFOs that were originally made 4 years ago. Goals: improve balance, to be able to get into the shower with her tub bench without assist, to be able to get up 2 steps at sister's home with railings on both sides but too far apart to reach both sides,  to be able to drive adapted car. Pt is staying at other sister's house with one step to get inside and currently picking up L leg with UEs and then stepping in. Pt steps down leading with LLE. Pt wants to work on walking side to side as this is challenging and she had not yet progressed to this at rehab. Pt able to get in/out the car with assist only to bring BLEs into the car. Hoping to drive by June or July with adapted car (already has one). Pt needs assist bringing LEs into and out of hospital bed. Pt able to perform sit>stand without assist from [facility] with a pillow on the seat for a boost. Pt denies any falls in the past 6 months. Pt has been increasing her time alone at home up to a few hours at a time to become more independent. Pt has  both manual and power WC. Uses power WC at home, uses manual WC out in community. Pt able to self propel manual WC. Pt needs assist putting pants around ankles but she can pull them up. Pt can put on her shoes but needs help getting her leg up to do so. Pt able to tie her shoelaces on her own. Pt has a tub/shower unit at home with a tub bench. No grab bars in the shower. Has hand held shower head. Pt currently taking a sponge bath. Pt has a regular height toilet with a BSC over top. Pt with h/o L wrist fx with no precautions, pt wore her soft brace to evaluation for comfort but reports her wrist has healed and is doing well following OT.     Currently in Pain?  No/denies          Treatment:   in // bars:  2" mat:  step up onto unstable matt with BUE support, upon clearance maintain on unsteady surface 40 seconds. First time patient has been able to negotiate a small step up.   Standing weight shifts in // bars with lateral leg lift 3 minutes cueing for use of LE musculature rather than triceps   standing hip abduction 4x each leg, fatiguing with repetition requiring a seated break due to knee buckling from fatigue.   Seated:  3lb weighted bar:  -row 10x clockwise, 10x counterclockwise  -twists 10x each direction  seated hamstring stretch 2x 60 seconds with PT overpressure   Seated modified  LAQ 12x each LE  Trunk extension and flexion pull/push with dowel 10x each LE  Isometric adduction 10x 3 second holds against PT resistance      Patient's condition has the potential to improve in response to therapy. Maximum improvement is yet to be obtained. The anticipated improvement is attainable and reasonable in a generally predictable time.  Patient reports she is able to move in her natural environment with decreasing support from family members.            PT Education - 11/27/18 1030    Education provided  Yes    Education Details  exercise technique, stability     Person(s) Educated  Patient    Methods  Explanation;Demonstration;Tactile cues;Verbal cues    Comprehension  Verbalized understanding;Returned demonstration;Verbal cues required;Tactile cues required       PT Short Term Goals - 11/25/18 1249      PT SHORT TERM GOAL #1   Title  Patient will be compliant with HEP 4-5 days/week for improved carryover between sessions.     Baseline  HEP  compliancy between sessions 6/17 compliant    Time  2    Period  Weeks    Status  Achieved    Target Date  10/15/18      PT SHORT TERM GOAL #2   Title  Patient will report no falls/LOB to indicate improved stability with mobility     Baseline  6/17: no falls, one episode of instability 7/20: no falls    Time  2    Period  Weeks    Status  Achieved    Target Date  11/06/18        PT Long Term Goals - 11/25/18 0001      PT LONG TERM GOAL #1   Title  Patient will perform TUG in 40 seconds or less with walker to improve safe negotiation of environment.     Baseline  5/26: 61 seconds 6/17: 60 seconds 7/20: 59 seconds    Time  8    Period  Weeks    Status  Partially Met    Target Date  01/20/19      PT LONG TERM GOAL #2   Title  Pt's 9mT will improve to at least 0.5 m/s for improved safety ambulating in home    Baseline  0.14 m/s; 5/15: .283m  7/10: .36 m/s  8/20: .42 m/s  9/26: 0.2646m10/14: .26 m/s improved hip and knee clearance 11/15: 0.66m55mmproved hip and knee clearance; 12/5: .29 m/s improved step length and forward moment: decreased foot drag. 05/09/2018: 0.21 m/s 2/6: .237 m/s 5/26: .23 m/s 6/17: .24 m/s  7/20: 0.26 m/s    Time  8    Period  Weeks    Status  Partially Met    Target Date  01/20/19      PT LONG TERM GOAL #3   Title  Pt's Bil hip F strength will improve to at least 2+/5 BLEs for improved gait mechanics and safety    Baseline  L: 2/5, R: 1/5 7/10: 2/5 bilaterally  8/20: 2/5 bilat 9/26: 2/5 bilat 10/14: 2/5 bilaterally ,improved hip flexor muscle activation 2/5: improved  hip flexor and quad activation: 12/5: 2/5 ; 05/09/2018: 2/5 2/6: 3/5 knee, 2 to 2+/5 hip 5/26: 2-/5 hips 3/5 knees 6/17: 2-/5 hips 3/5 knees    Time  8    Period  Weeks    Status  Partially Met    Target Date  01/20/19      PT LONG TERM GOAL #4   Title  Pt's ABC Scale will improve to at least 40% to demonstrate improved balance    Baseline  28.13% 5/15; 24.4% 7/10: 23.4% 8/20: 22. 2% 9/26: 25.3% 10/14: 26.5% 11/15: 24.38% 12/5: 26%; 05/09/2018: 29% 2/6: 23.75% 526: 30% 6/17: 26.8%  7/2: 28%    Time  8    Period  Weeks    Status  Partially Met    Target Date  01/20/19      PT LONG TERM GOAL #5   Title  Patient will negotiate one step ~4 inches to enter house independently to increase functional independence in natural environment    Baseline  /20: unable to do 9/26: patient too fatigued/not feeling well to perform 10/14: unable to perform at this time 11/15: unable to perform at this time; 12/5: able to lift 1 inch; 05/09/2018: unable to clear 4" step, able to lift foot ~ 1in 5/26: has been unable to perform 6/17: able to lift foot 1.25 inch    Time  8  Period  Weeks    Status  On-going    Target Date  01/20/19      PT LONG TERM GOAL #7   Title  Patient will stand 5 minutes to increase standing duration to allow patient to return to washing dishes and clothes.     Baseline  12/5: 56 seconds; 05/09/2018: 1 min 18 sec 2/6: 2 minutes last session 5/26: 1 minute 36 seconds limited by breathing 6/17: terminated due to abdomen pain  7/20 terminated due to overheating    Time  8    Period  Weeks    Status  Partially Met    Target Date  01/20/19      PT LONG TERM GOAL #8   Title  Patient will negotiate 30 ft of changing surfaces outside such as pavement and parking lot to allow patient to walk into church.    Baseline  12/5: does not walk outside 2/24: unable to test due to rain 5/26: unable since COVID 6/17: raining 7/20 too hot to test    Time  8    Period  Weeks    Status  On-going    Target  Date  01/20/19            Plan - 11/27/18 1256    Clinical Impression Statement  Patient's goals assessed 11/25/18, please see that note for full details on progress towards goals. Patient's condition has the potential to improve in response to therapy. Maximum improvement is yet to be obtained. The anticipated improvement is attainable and reasonable in a generally predictable time.  Patient progressing and able to step up onto 2" inch unstable surface. The patient will continue to benefit from skilled therapeutic intervention to address deficits in strength, mobility, balance, and function for improved overall QOL and safety    Rehab Potential  Good    PT Frequency  2x / week    PT Duration  8 weeks    PT Treatment/Interventions  ADLs/Self Care Home Management;Aquatic Therapy;Biofeedback;Cryotherapy;Electrical Stimulation;Iontophoresis 55m/ml Dexamethasone;Traction;Moist Heat;Ultrasound;DME Instruction;Gait training;Stair training;Functional mobility training;Therapeutic activities;Therapeutic exercise;Balance training;Neuromuscular re-education;Patient/family education;Orthotic Fit/Training;Wheelchair mobility training;Manual techniques;Compression bandaging;Passive range of motion;Dry needling;Energy conservation;Taping;Splinting    PT Next Visit Plan  strength/balance    PT Home Exercise Plan  to initiate next session (please incorporate hip flexor and adductor stretches in HEP if possible)    Consulted and Agree with Plan of Care  Patient       Patient will benefit from skilled therapeutic intervention in order to improve the following deficits and impairments:  Abnormal gait, Decreased activity tolerance, Decreased balance, Decreased coordination, Decreased endurance, Decreased knowledge of use of DME, Decreased mobility, Decreased range of motion, Decreased safety awareness, Decreased strength, Difficulty walking, Impaired perceived functional ability, Impaired sensation, Impaired tone,  Impaired UE functional use, Improper body mechanics, Postural dysfunction  Visit Diagnosis: 1. Muscle weakness (generalized)   2. Other abnormalities of gait and mobility   3. Unsteadiness on feet        Problem List Patient Active Problem List   Diagnosis Date Noted  . Abscess of female pelvis   . SVT (supraventricular tachycardia) ([place]   . Radial styloid tenosynovitis 03/12/2018  . Wheelchair confinement 02/27/2018  . Localized osteoporosis with current pathological fracture with routine healing 01/19/2017  . Wrist fracture 01/16/2017  . Sprain of ankle 03/23/2016  . Closed fracture of lateral malleolus 03/16/2016  . Health care maintenance 01/24/2016  . Blood pressure elevated without history of HTN 10/25/2015  . Essential hypertension 10/25/2015  .  Multiple sclerosis ([place]) 10/02/2015  . Chronic left shoulder pain 07/19/2015  . Multiple sclerosis exacerbation ([place]) 07/14/2015  . MS (multiple sclerosis) ([place]) 11/26/2014  . Increased body mass index 11/26/2014  . HPV test positive 11/26/2014  . Status post laparoscopic supracervical hysterectomy 11/26/2014  . Galactorrhea 11/26/2014  . Back ache 05/21/2014  . Adiposity 05/21/2014  . Disordered sleep 05/21/2014  . Muscle spasticity 05/21/2014  . Spasticity 05/21/2014  . Calculus of kidney 12/09/2013  . Renal colic 96/28/3662  . Hypercholesteremia 08/19/2013  . Hereditary and idiopathic neuropathy 08/19/2013  . Hypercholesterolemia without hypertriglyceridemia 08/19/2013  . Bladder infection, chronic 07/25/2012  . Disorder of bladder function 07/25/2012  . Incomplete bladder emptying 07/25/2012  . Microscopic hematuria 07/25/2012  . Right upper quadrant pain 07/25/2012   Janna Arch, PT, DPT   11/27/2018, 12:58 PM  [place] [facility] SERVICES [address] [place], [place], [address] Phone: [phone]   Fax:  [phone]  Name: Lisa Williams MRN:  [id number] Date of Birth: [birth year]

## 2018-12-02 ENCOUNTER — Ambulatory Visit: Payer: Medicare HMO | Admitting: Physical Therapy

## 2018-12-02 ENCOUNTER — Encounter: Payer: Self-pay | Admitting: Physical Therapy

## 2018-12-02 ENCOUNTER — Other Ambulatory Visit: Payer: Self-pay

## 2018-12-02 DIAGNOSIS — M6281 Muscle weakness (generalized): Secondary | ICD-10-CM

## 2018-12-02 DIAGNOSIS — R2681 Unsteadiness on feet: Secondary | ICD-10-CM

## 2018-12-02 DIAGNOSIS — R2689 Other abnormalities of gait and mobility: Secondary | ICD-10-CM

## 2018-12-02 NOTE — Therapy (Signed)
[place] MAIN [facility] SERVICES [place], [place], [address] Phone: [phone]   Fax:  [phone]  Physical Therapy Treatment  Patient Details  Name: Lisa Williams MRN: [id number] Date of Birth: 05/05/1961 Referring Provider (PT): Gurney Maxin, MD   Encounter Date: 12/02/2018  PT End of Session - 12/02/18 1140    Visit Number  101    Number of Visits  115    Date for PT Re-Evaluation  01/20/19    Authorization Type  10/10 next 1/10 PN start 7/22/    PT Start Time  1032    PT Stop Time  1115    PT Time Calculation (min)  43 min    Equipment Utilized During Treatment  Gait belt    Activity Tolerance  Patient tolerated treatment well;Other (comment)   heat intolerance   Behavior During Therapy  WFL for tasks assessed/performed       Past Medical History:  Diagnosis Date  . Abdominal pain, right upper quadrant   . Back pain   . Calculus of kidney 12/09/2013  . Chronic back pain    unspecified  . Chronic left shoulder pain 07/19/2015  . Functional disorder of bladder    other  . Galactorrhea 11/26/2014   Chronic   . Hereditary and idiopathic neuropathy 08/19/2013  . HPV test positive   . Hypercholesteremia 08/19/2013  . Incomplete bladder emptying   . Microscopic hematuria   . MS (multiple sclerosis) ([place])   . Muscle spasticity 05/21/2014  . Nonspecific findings on examination of urine    other  . Osteopenia   . Status post laparoscopic supracervical hysterectomy 11/26/2014  . Tobacco user 11/26/2014  . Wrist fracture     Past Surgical History:  Procedure Laterality Date  . bilateral tubal ligation  1996  . BREAST CYST EXCISION Left 2002  . KNEE SURGERY     right  . LAPAROSCOPIC SUPRACERVICAL HYSTERECTOMY  08/05/2013  . ORIF WRIST FRACTURE Left 01/17/2017   Procedure: OPEN REDUCTION INTERNAL FIXATION (ORIF) WRIST FRACTURE;  Surgeon: Lovell Sheehan, MD;  Location: [facility] ORS;  Service: Orthopedics;   Laterality: Left;  . TUBAL LIGATION Bilateral   . VAGINAL HYSTERECTOMY  03/2006    There were no vitals filed for this visit.  Subjective Assessment - 12/02/18 1035    Subjective  Patient wore her neck ice collar to reduce her body temperature per PT request. Compliant with HEP no falls or LOB since last session. Reports not going out much to avoid the heat; Reports adherence with HEP using the ball to work her core.    Pertinent History  Pt is a 58 y/o F who presents with BLE weakness and difficulty ambulating due to h/o MS that was diagnosed in 1995. Pt denies any pain associated. Pt was admitted to the hospital in November 2018 due to MS exacerbation. Nov-Dec to HHPT-->[facility] until end of Jan. After rehab the pt was able to ambulate up to 100 ft with the RW. Pt has Bil AFO, her therapist at [facility] thinks she needs a new AFO as her feet still drag when she ambulates, pt reports HHPT mentioned a spring AFO. Pt has custom AFOs that were originally made 4 years ago. Goals: improve balance, to be able to get into the shower with her tub bench without assist, to be able to get up 2 steps at sister's home with railings on both sides but too far apart to reach both sides, to  be able to drive adapted car. Pt is staying at other sister's house with one step to get inside and currently picking up L leg with UEs and then stepping in. Pt steps down leading with LLE. Pt wants to work on walking side to side as this is challenging and she had not yet progressed to this at rehab. Pt able to get in/out the car with assist only to bring BLEs into the car. Hoping to drive by June or July with adapted car (already has one). Pt needs assist bringing LEs into and out of hospital bed. Pt able to perform sit>stand without assist from [facility] with a pillow on the seat for a boost. Pt denies any falls in the past 6 months. Pt has been increasing her time alone at home up to a few hours at a time to become more independent.  Pt has both manual and power WC. Uses power WC at home, uses manual WC out in community. Pt able to self propel manual WC. Pt needs assist putting pants around ankles but she can pull them up. Pt can put on her shoes but needs help getting her leg up to do so. Pt able to tie her shoelaces on her own. Pt has a tub/shower unit at home with a tub bench. No grab bars in the shower. Has hand held shower head. Pt currently taking a sponge bath. Pt has a regular height toilet with a BSC over top. Pt with h/o L wrist fx with no precautions, pt wore her soft brace to evaluation for comfort but reports her wrist has healed and is doing well following OT.     Currently in Pain?  No/denies    Multiple Pain Sites  No                 Treatment:  NMR: in // bars: Feet apart: -head turns side/side x10 reps unsupported with min A for safety to CGA Required min VCs for gaze stabilization to challenge head turns and reduce dizziness;  Forward step with 2 rail assist hold for 10 sec, reducing rail assist to 1 HHA x2 sets each LE to challenge staggered stance balance, min A for safety; required min Vcs to improve erect posture for better stance control;    Standing with RW: -feet apart, letting go of walker x10 sec unsupported standing with min A to CGA for safety; pt does flex forward at the hip having difficulty achieving full erect posture;  Gait with RW x40 feet with chair follow, CGA for safety, reciprocal gait pattern with increase core activation during swing for better foot clearance. Patient exhibits 3 point gait pattern with advancing walker and then stepping into walker with increased weight bearing in BUE for dynamic balance control;    Therapeutic exercise:  Seated:  3lb weighted bar:             -row 10x clockwise, 10x counterclockwise             -twists 10x each direction  Holding blue weighted ball:  -BUE up/down x10 reps;  -BUE side/side rotation x10 reps;  -BUE diagonals x10  reps each direction Required mod VCs to increase core stabilization for better erect posture and pelvic tilt especially when reaching outside base of support;   seated hamstring stretch 2x 60 seconds with PT overpressure   Seated modified  LAQ 10x each LE   Isometric adduction 15x 3 second hold with small ball between knees;  Response to treatment:  Patient tolerated well. She does fatigue requiring short seated rest breaks. She was able to progress standing balance exercise with less rail assist to challenge stance control. Patient verbalized understanding of HEP;              PT Education - 12/02/18 1140    Education provided  Yes    Education Details  exercise technique, strengthening, balance; HEP reinforced;    Person(s) Educated  Patient    Methods  Explanation;Verbal cues    Comprehension  Verbalized understanding;Returned demonstration;Verbal cues required;Need further instruction       PT Short Term Goals - 11/25/18 1249      PT SHORT TERM GOAL #1   Title  Patient will be compliant with HEP 4-5 days/week for improved carryover between sessions.     Baseline  HEP compliancy between sessions 6/17 compliant    Time  2    Period  Weeks    Status  Achieved    Target Date  10/15/18      PT SHORT TERM GOAL #2   Title  Patient will report no falls/LOB to indicate improved stability with mobility     Baseline  6/17: no falls, one episode of instability 7/20: no falls    Time  2    Period  Weeks    Status  Achieved    Target Date  11/06/18        PT Long Term Goals - 11/25/18 0001      PT LONG TERM GOAL #1   Title  Patient will perform TUG in 40 seconds or less with walker to improve safe negotiation of environment.     Baseline  5/26: 61 seconds 6/17: 60 seconds 7/20: 59 seconds    Time  8    Period  Weeks    Status  Partially Met    Target Date  01/20/19      PT LONG TERM GOAL #2   Title  Pt's 3mT will improve to at least 0.5 m/s for  improved safety ambulating in home    Baseline  0.14 m/s; 5/15: .257m  7/10: .36 m/s  8/20: .42 m/s  9/26: 0.2650m10/14: .26 m/s improved hip and knee clearance 11/15: 0.74m20mmproved hip and knee clearance; 12/5: .29 m/s improved step length and forward moment: decreased foot drag. 05/09/2018: 0.21 m/s 2/6: .237 m/s 5/26: .23 m/s 6/17: .24 m/s  7/20: 0.26 m/s    Time  8    Period  Weeks    Status  Partially Met    Target Date  01/20/19      PT LONG TERM GOAL #3   Title  Pt's Bil hip F strength will improve to at least 2+/5 BLEs for improved gait mechanics and safety    Baseline  L: 2/5, R: 1/5 7/10: 2/5 bilaterally  8/20: 2/5 bilat 9/26: 2/5 bilat 10/14: 2/5 bilaterally ,improved hip flexor muscle activation 2/5: improved hip flexor and quad activation: 12/5: 2/5 ; 05/09/2018: 2/5 2/6: 3/5 knee, 2 to 2+/5 hip 5/26: 2-/5 hips 3/5 knees 6/17: 2-/5 hips 3/5 knees    Time  8    Period  Weeks    Status  Partially Met    Target Date  01/20/19      PT LONG TERM GOAL #4   Title  Pt's ABC Scale will improve to at least 40% to demonstrate improved balance    Baseline  28.13% 5/15; 24.4% 7/10: 23.4% 8/20: 22.  2% 9/26: 25.3% 10/14: 26.5% 11/15: 24.38% 12/5: 26%; 05/09/2018: 29% 2/6: 23.75% 526: 30% 6/17: 26.8%  7/2: 28%    Time  8    Period  Weeks    Status  Partially Met    Target Date  01/20/19      PT LONG TERM GOAL #5   Title  Patient will negotiate one step ~4 inches to enter house independently to increase functional independence in natural environment    Baseline  /20: unable to do 9/26: patient too fatigued/not feeling well to perform 10/14: unable to perform at this time 11/15: unable to perform at this time; 12/5: able to lift 1 inch; 05/09/2018: unable to clear 4" step, able to lift foot ~ 1in 5/26: has been unable to perform 6/17: able to lift foot 1.25 inch    Time  8    Period  Weeks    Status  On-going    Target Date  01/20/19      PT LONG TERM GOAL #7   Title  Patient will stand 5  minutes to increase standing duration to allow patient to return to washing dishes and clothes.     Baseline  12/5: 56 seconds; 05/09/2018: 1 min 18 sec 2/6: 2 minutes last session 5/26: 1 minute 36 seconds limited by breathing 6/17: terminated due to abdomen pain  7/20 terminated due to overheating    Time  8    Period  Weeks    Status  Partially Met    Target Date  01/20/19      PT LONG TERM GOAL #8   Title  Patient will negotiate 30 ft of changing surfaces outside such as pavement and parking lot to allow patient to walk into church.    Baseline  12/5: does not walk outside 2/24: unable to test due to rain 5/26: unable since COVID 6/17: raining 7/20 too hot to test    Time  8    Period  Weeks    Status  On-going    Target Date  01/20/19            Plan - 12/02/18 1141    Clinical Impression Statement  Patient motivated and participated well within session. She was educated in advanced strengthening exercise to facilitate increased core and LE strength. Patient does require min VCs for correct exercise technique; Able to progress exercise with increased resistance/repetition. Patient is progressing with gait tasks being able to take longer steps with better foot clearance. She does require increased core activation during initial swing to facilitate hip flexion due to hip weakness. Patient instructed in advanced balance tasks to challenge core stability and weight shift control. She would benefit from additional skilled PT Intervention to improve strength, balance and gait safety;    Rehab Potential  Good    PT Frequency  2x / week    PT Duration  8 weeks    PT Treatment/Interventions  ADLs/Self Care Home Management;Aquatic Therapy;Biofeedback;Cryotherapy;Electrical Stimulation;Iontophoresis 71m/ml Dexamethasone;Traction;Moist Heat;Ultrasound;DME Instruction;Gait training;Stair training;Functional mobility training;Therapeutic activities;Therapeutic exercise;Balance training;Neuromuscular  re-education;Patient/family education;Orthotic Fit/Training;Wheelchair mobility training;Manual techniques;Compression bandaging;Passive range of motion;Dry needling;Energy conservation;Taping;Splinting    PT Next Visit Plan  strength/balance    PT Home Exercise Plan  to initiate next session (please incorporate hip flexor and adductor stretches in HEP if possible)    Consulted and Agree with Plan of Care  Patient       Patient will benefit from skilled therapeutic intervention in order to improve the following deficits and impairments:  Abnormal gait, Decreased activity tolerance, Decreased balance, Decreased coordination, Decreased endurance, Decreased knowledge of use of DME, Decreased mobility, Decreased range of motion, Decreased safety awareness, Decreased strength, Difficulty walking, Impaired perceived functional ability, Impaired sensation, Impaired tone, Impaired UE functional use, Improper body mechanics, Postural dysfunction  Visit Diagnosis: 1. Muscle weakness (generalized)   2. Other abnormalities of gait and mobility   3. Unsteadiness on feet        Problem List Patient Active Problem List   Diagnosis Date Noted  . Abscess of female pelvis   . SVT (supraventricular tachycardia) ([place])   . Radial styloid tenosynovitis 03/12/2018  . Wheelchair confinement 02/27/2018  . Localized osteoporosis with current pathological fracture with routine healing 01/19/2017  . Wrist fracture 01/16/2017  . Sprain of ankle 03/23/2016  . Closed fracture of lateral malleolus 03/16/2016  . Health care maintenance 01/24/2016  . Blood pressure elevated without history of HTN 10/25/2015  . Essential hypertension 10/25/2015  . Multiple sclerosis ([place]) 10/02/2015  . Chronic left shoulder pain 07/19/2015  . Multiple sclerosis exacerbation ([place]) 07/14/2015  . MS (multiple sclerosis) ([place]) 11/26/2014  . Increased body mass index 11/26/2014  . HPV test positive 11/26/2014  . Status post  laparoscopic supracervical hysterectomy 11/26/2014  . Galactorrhea 11/26/2014  . Back ache 05/21/2014  . Adiposity 05/21/2014  . Disordered sleep 05/21/2014  . Muscle spasticity 05/21/2014  . Spasticity 05/21/2014  . Calculus of kidney 12/09/2013  . Renal colic 51/89/8421  . Hypercholesteremia 08/19/2013  . Hereditary and idiopathic neuropathy 08/19/2013  . Hypercholesterolemia without hypertriglyceridemia 08/19/2013  . Bladder infection, chronic 07/25/2012  . Disorder of bladder function 07/25/2012  . Incomplete bladder emptying 07/25/2012  . Microscopic hematuria 07/25/2012  . Right upper quadrant pain 07/25/2012    Keon Benscoter  PT, DPT 12/02/2018, 11:43 AM  [place] MAIN [facility] SERVICES [place], [place], [address] Phone: [phone]   Fax:  [phone]  Name: Lisa Williams MRN: [id number] Date of Birth: 1960-11-13

## 2018-12-04 ENCOUNTER — Ambulatory Visit: Payer: Medicare HMO

## 2018-12-06 ENCOUNTER — Other Ambulatory Visit: Payer: Self-pay

## 2018-12-06 ENCOUNTER — Ambulatory Visit: Payer: Medicare HMO

## 2018-12-06 DIAGNOSIS — R2681 Unsteadiness on feet: Secondary | ICD-10-CM

## 2018-12-06 DIAGNOSIS — M6281 Muscle weakness (generalized): Secondary | ICD-10-CM | POA: Diagnosis not present

## 2018-12-06 DIAGNOSIS — R2689 Other abnormalities of gait and mobility: Secondary | ICD-10-CM

## 2018-12-06 NOTE — Therapy (Signed)
[place] MAIN [facility] SERVICES [address] [place], [place], [address] Phone: [phone]   Fax:  [phone]  Physical Therapy Treatment  Patient Details  Name: Lisa Williams MRN: [id number] Date of Birth: [birth year] Referring Provider (PT): Gurney Maxin, MD   Encounter Date: 12/06/2018  PT End of Session - 12/06/18 1226    Visit Number  102    Number of Visits  115    Date for PT Re-Evaluation  01/20/19    Authorization Type  1/10 PN start 7/22/    PT Start Time  0902    PT Stop Time  0946    PT Time Calculation (min)  44 min    Equipment Utilized During Treatment  Gait belt    Activity Tolerance  Patient tolerated treatment well;Other (comment)   heat intolerance   Behavior During Therapy  WFL for tasks assessed/performed       Past Medical History:  Diagnosis Date  . Abdominal pain, right upper quadrant   . Back pain   . Calculus of kidney 12/09/2013  . Chronic back pain    unspecified  . Chronic left shoulder pain 07/19/2015  . Functional disorder of bladder    other  . Galactorrhea 11/26/2014   Chronic   . Hereditary and idiopathic neuropathy 08/19/2013  . HPV test positive   . Hypercholesteremia 08/19/2013  . Incomplete bladder emptying   . Microscopic hematuria   . MS (multiple sclerosis) ([place])   . Muscle spasticity 05/21/2014  . Nonspecific findings on examination of urine    other  . Osteopenia   . Status post laparoscopic supracervical hysterectomy 11/26/2014  . Tobacco user 11/26/2014  . Wrist fracture     Past Surgical History:  Procedure Laterality Date  . bilateral tubal ligation  1996  . BREAST CYST EXCISION Left 2002  . KNEE SURGERY     right  . LAPAROSCOPIC SUPRACERVICAL HYSTERECTOMY  08/05/2013  . ORIF WRIST FRACTURE Left 01/17/2017   Procedure: OPEN REDUCTION INTERNAL FIXATION (ORIF) WRIST FRACTURE;  Surgeon: Lovell Sheehan, MD;  Location: [facility] ORS;  Service: Orthopedics;  Laterality: Left;   . TUBAL LIGATION Bilateral   . VAGINAL HYSTERECTOMY  03/2006    There were no vitals filed for this visit.  Subjective Assessment - 12/06/18 0957    Subjective  Patient forgot her neck ice collar but states she is not overheated due to the cooler temperatures outside. No falls or LOB since last session. Compliance with HEP.    Pertinent History  Pt is a 58 y/o F who presents with BLE weakness and difficulty ambulating due to h/o MS that was diagnosed in 1995. Pt denies any pain associated. Pt was admitted to the hospital in November 2018 due to MS exacerbation. Nov-Dec to HHPT-->[facility] until end of Jan. After rehab the pt was able to ambulate up to 100 ft with the RW. Pt has Bil AFO, her therapist at [place] thinks she needs a new AFO as her feet still drag when she ambulates, pt reports HHPT mentioned a spring AFO. Pt has custom AFOs that were originally made 4 years ago. Goals: improve balance, to be able to get into the shower with her tub bench without assist, to be able to get up 2 steps at sister's home with railings on both sides but too far apart to reach both sides, to be able to drive adapted car. Pt is staying at other sister's house with one step to get  inside and currently picking up L leg with UEs and then stepping in. Pt steps down leading with LLE. Pt wants to work on walking side to side as this is challenging and she had not yet progressed to this at rehab. Pt able to get in/out the car with assist only to bring BLEs into the car. Hoping to drive by June or July with adapted car (already has one). Pt needs assist bringing LEs into and out of hospital bed. Pt able to perform sit>stand without assist from [facility] with a pillow on the seat for a boost. Pt denies any falls in the past 6 months. Pt has been increasing her time alone at home up to a few hours at a time to become more independent. Pt has both manual and power WC. Uses power WC at home, uses manual WC out in community. Pt  able to self propel manual WC. Pt needs assist putting pants around ankles but she can pull them up. Pt can put on her shoes but needs help getting her leg up to do so. Pt able to tie her shoelaces on her own. Pt has a tub/shower unit at home with a tub bench. No grab bars in the shower. Has hand held shower head. Pt currently taking a sponge bath. Pt has a regular height toilet with a BSC over top. Pt with h/o L wrist fx with no precautions, pt wore her soft brace to evaluation for comfort but reports her wrist has healed and is doing well following OT.     Currently in Pain?  No/denies        Treatment:   in // bars: 2" mat:  step up onto unstable mat with BUE support, upon clearance maintain on unsteady surface 40 seconds. X2 trials.   in // bars: Forward step with 2 rail assist, with LLE back decrease to single UE hold for 10 seconds, with RLE back reduce to no UE support hold for 10 sec,  x2 sets each LE to challenge staggered stance balance, min A for safety; required min Vcs to improve erect posture for better stance control;     in // bars: weight shift standing with posterior pelvic tilt: hip weight shift, improved knee flexion of RLE , challenged with LLE; min A for stabilization and safety  In // bars: Static standing tic tac toe; ambulate 2 steps forward pivot over LLE with BUE support, static stand x2-3 minutes, step and pivot over LLE again and ambulate backwards 2 steps. Min A/ CGA for safety  Sit to stand technique with UE support, decreased reliance upon UE's with improved eccentric control, no "plopping". Multiple performances throughout session   Seated:  Modified bent arm 21's : 7x 90 degrees, 7x 45 degrees, 7x 0 degrees. Second trial with straight arms for increased difficulty.   Seated hamstring stretch 90 seconds each LE with PT overpressure.      Pt educated throughout session about proper posture and technique with exercises. Improved exercise technique, movement at  target joints, use of target muscles after minverbal, visual, tactile cues  Seated rest breaks and use of ice pack utilized to keep body temperature at functional range to reduce MS exacerbation.                        PT Education - 12/06/18 0958    Education provided  Yes    Education Details  exercise technique, stability    Person(s) Educated  Patient    Methods  Explanation;Demonstration;Tactile cues;Verbal cues    Comprehension  Verbalized understanding;Returned demonstration;Verbal cues required;Tactile cues required       PT Short Term Goals - 11/25/18 1249      PT SHORT TERM GOAL #1   Title  Patient will be compliant with HEP 4-5 days/week for improved carryover between sessions.     Baseline  HEP compliancy between sessions 6/17 compliant    Time  2    Period  Weeks    Status  Achieved    Target Date  10/15/18      PT SHORT TERM GOAL #2   Title  Patient will report no falls/LOB to indicate improved stability with mobility     Baseline  6/17: no falls, one episode of instability 7/20: no falls    Time  2    Period  Weeks    Status  Achieved    Target Date  11/06/18        PT Long Term Goals - 11/25/18 0001      PT LONG TERM GOAL #1   Title  Patient will perform TUG in 40 seconds or less with walker to improve safe negotiation of environment.     Baseline  5/26: 61 seconds 6/17: 60 seconds 7/20: 59 seconds    Time  8    Period  Weeks    Status  Partially Met    Target Date  01/20/19      PT LONG TERM GOAL #2   Title  Pt's 32mT will improve to at least 0.5 m/s for improved safety ambulating in home    Baseline  0.14 m/s; 5/15: .234m  7/10: .36 m/s  8/20: .42 m/s  9/26: 0.2619m10/14: .26 m/s improved hip and knee clearance 11/15: 0.17m74mmproved hip and knee clearance; 12/5: .29 m/s improved step length and forward moment: decreased foot drag. 05/09/2018: 0.21 m/s 2/6: .237 m/s 5/26: .23 m/s 6/17: .24 m/s  7/20: 0.26 m/s    Time  8     Period  Weeks    Status  Partially Met    Target Date  01/20/19      PT LONG TERM GOAL #3   Title  Pt's Bil hip F strength will improve to at least 2+/5 BLEs for improved gait mechanics and safety    Baseline  L: 2/5, R: 1/5 7/10: 2/5 bilaterally  8/20: 2/5 bilat 9/26: 2/5 bilat 10/14: 2/5 bilaterally ,improved hip flexor muscle activation 2/5: improved hip flexor and quad activation: 12/5: 2/5 ; 05/09/2018: 2/5 2/6: 3/5 knee, 2 to 2+/5 hip 5/26: 2-/5 hips 3/5 knees 6/17: 2-/5 hips 3/5 knees    Time  8    Period  Weeks    Status  Partially Met    Target Date  01/20/19      PT LONG TERM GOAL #4   Title  Pt's ABC Scale will improve to at least 40% to demonstrate improved balance    Baseline  28.13% 5/15; 24.4% 7/10: 23.4% 8/20: 22. 2% 9/26: 25.3% 10/14: 26.5% 11/15: 24.38% 12/5: 26%; 05/09/2018: 29% 2/6: 23.75% 526: 30% 6/17: 26.8%  7/2: 28%    Time  8    Period  Weeks    Status  Partially Met    Target Date  01/20/19      PT LONG TERM GOAL #5   Title  Patient will negotiate one step ~4 inches to enter house independently to increase functional independence in natural environment  Baseline  /20: unable to do 9/26: patient too fatigued/not feeling well to perform 10/14: unable to perform at this time 11/15: unable to perform at this time; 12/5: able to lift 1 inch; 05/09/2018: unable to clear 4" step, able to lift foot ~ 1in 5/26: has been unable to perform 6/17: able to lift foot 1.25 inch    Time  8    Period  Weeks    Status  On-going    Target Date  01/20/19      PT LONG TERM GOAL #7   Title  Patient will stand 5 minutes to increase standing duration to allow patient to return to washing dishes and clothes.     Baseline  12/5: 56 seconds; 05/09/2018: 1 min 18 sec 2/6: 2 minutes last session 5/26: 1 minute 36 seconds limited by breathing 6/17: terminated due to abdomen pain  7/20 terminated due to overheating    Time  8    Period  Weeks    Status  Partially Met    Target Date  01/20/19       PT LONG TERM GOAL #8   Title  Patient will negotiate 30 ft of changing surfaces outside such as pavement and parking lot to allow patient to walk into church.    Baseline  12/5: does not walk outside 2/24: unable to test due to rain 5/26: unable since COVID 6/17: raining 7/20 too hot to test    Time  8    Period  Weeks    Status  On-going    Target Date  01/20/19            Plan - 12/06/18 1228    Clinical Impression Statement  Patient presents to physical therapy with increasing stability in a variety of bases of support with decreased need for UE support. Patient is more challenged maintaining stability on her left leg with weight acceptance. Improved clearance of obstacles in standing performed with increased capacity for prolonged standing tasks. She is now able to perform tandem stance with decreasing UE support. The patient will continue to benefit from skilled therapeutic intervention to address deficits in strength, mobility, balance, and function for improved overall QOL and safety    Rehab Potential  Good    PT Frequency  2x / week    PT Duration  8 weeks    PT Treatment/Interventions  ADLs/Self Care Home Management;Aquatic Therapy;Biofeedback;Cryotherapy;Electrical Stimulation;Iontophoresis 60m/ml Dexamethasone;Traction;Moist Heat;Ultrasound;DME Instruction;Gait training;Stair training;Functional mobility training;Therapeutic activities;Therapeutic exercise;Balance training;Neuromuscular re-education;Patient/family education;Orthotic Fit/Training;Wheelchair mobility training;Manual techniques;Compression bandaging;Passive range of motion;Dry needling;Energy conservation;Taping;Splinting    PT Next Visit Plan  strength/balance    PT Home Exercise Plan  to initiate next session (please incorporate hip flexor and adductor stretches in HEP if possible)    Consulted and Agree with Plan of Care  Patient       Patient will benefit from skilled therapeutic intervention in order to  improve the following deficits and impairments:  Abnormal gait, Decreased activity tolerance, Decreased balance, Decreased coordination, Decreased endurance, Decreased knowledge of use of DME, Decreased mobility, Decreased range of motion, Decreased safety awareness, Decreased strength, Difficulty walking, Impaired perceived functional ability, Impaired sensation, Impaired tone, Impaired UE functional use, Improper body mechanics, Postural dysfunction  Visit Diagnosis: 1. Muscle weakness (generalized)   2. Other abnormalities of gait and mobility   3. Unsteadiness on feet        Problem List Patient Active Problem List   Diagnosis Date Noted  . Abscess of female pelvis   .  SVT (supraventricular tachycardia) ([place])   . Radial styloid tenosynovitis 03/12/2018  . Wheelchair confinement 02/27/2018  . Localized osteoporosis with current pathological fracture with routine healing 01/19/2017  . Wrist fracture 01/16/2017  . Sprain of ankle 03/23/2016  . Closed fracture of lateral malleolus 03/16/2016  . Health care maintenance 01/24/2016  . Blood pressure elevated without history of HTN 10/25/2015  . Essential hypertension 10/25/2015  . Multiple sclerosis ([place]) 10/02/2015  . Chronic left shoulder pain 07/19/2015  . Multiple sclerosis exacerbation ([place]) 07/14/2015  . MS (multiple sclerosis) ([place]) 11/26/2014  . Increased body mass index 11/26/2014  . HPV test positive 11/26/2014  . Status post laparoscopic supracervical hysterectomy 11/26/2014  . Galactorrhea 11/26/2014  . Back ache 05/21/2014  . Adiposity 05/21/2014  . Disordered sleep 05/21/2014  . Muscle spasticity 05/21/2014  . Spasticity 05/21/2014  . Calculus of kidney 12/09/2013  . Renal colic 00/97/9499  . Hypercholesteremia 08/19/2013  . Hereditary and idiopathic neuropathy 08/19/2013  . Hypercholesterolemia without hypertriglyceridemia 08/19/2013  . Bladder infection, chronic 07/25/2012  . Disorder of bladder function  07/25/2012  . Incomplete bladder emptying 07/25/2012  . Microscopic hematuria 07/25/2012  . Right upper quadrant pain 07/25/2012   Janna Arch, PT, DPT   12/06/2018, 12:29 PM  [place] MAIN [facility] SERVICES [address] [place], [place], [address] Phone: [phone]   Fax:  [phone]  Name: Lisa Williams MRN: [id number] Date of Birth: 1961/02/25

## 2018-12-10 ENCOUNTER — Other Ambulatory Visit: Payer: Self-pay

## 2018-12-10 ENCOUNTER — Ambulatory Visit: Payer: Medicare HMO | Attending: Neurology

## 2018-12-10 DIAGNOSIS — R2689 Other abnormalities of gait and mobility: Secondary | ICD-10-CM | POA: Insufficient documentation

## 2018-12-10 DIAGNOSIS — R2681 Unsteadiness on feet: Secondary | ICD-10-CM | POA: Insufficient documentation

## 2018-12-10 DIAGNOSIS — M6281 Muscle weakness (generalized): Secondary | ICD-10-CM | POA: Insufficient documentation
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
## 2018-12-12 ENCOUNTER — Ambulatory Visit: Payer: Medicare HMO

## 2018-12-12 ENCOUNTER — Other Ambulatory Visit: Payer: Self-pay

## 2018-12-12 DIAGNOSIS — M6281 Muscle weakness (generalized): Secondary | ICD-10-CM | POA: Diagnosis not present

## 2018-12-12 DIAGNOSIS — R2681 Unsteadiness on feet: Secondary | ICD-10-CM

## 2018-12-12 DIAGNOSIS — R2689 Other abnormalities of gait and mobility: Secondary | ICD-10-CM

## 2018-12-12 NOTE — Therapy (Signed)
[place] [facility] [address] [place], [place], [address] Phone: [phone]   Fax:  [phone]  Physical Therapy Treatment  Patient Details  Name: Lisa Williams MRN: [id number] Date of Birth: [birth year] Referring Provider (PT): Gurney Maxin, MD   Encounter Date: 12/12/2018  PT End of Session - 12/12/18 0947    Visit Number  104    Number of Visits  115    Date for PT Re-Evaluation  01/20/19    Authorization Type  4/10 PN start 7/22    PT Start Time  0930    PT Stop Time  1015    PT Time Calculation (min)  45 min    Equipment Utilized During Treatment  Gait belt    Activity Tolerance  Patient tolerated treatment well;Other (comment)   heat intolerance   Behavior During Therapy  WFL for tasks assessed/performed       Past Medical History:  Diagnosis Date  . Abdominal pain, right upper quadrant   . Back pain   . Calculus of kidney 12/09/2013  . Chronic back pain    unspecified  . Chronic left shoulder pain 07/19/2015  . Functional disorder of bladder    other  . Galactorrhea 11/26/2014   Chronic   . Hereditary and idiopathic neuropathy 08/19/2013  . HPV test positive   . Hypercholesteremia 08/19/2013  . Incomplete bladder emptying   . Microscopic hematuria   . MS (multiple sclerosis) ([place])   . Muscle spasticity 05/21/2014  . Nonspecific findings on examination of urine    other  . Osteopenia   . Status post laparoscopic supracervical hysterectomy 11/26/2014  . Tobacco user 11/26/2014  . Wrist fracture     Past Surgical History:  Procedure Laterality Date  . bilateral tubal ligation  1996  . BREAST CYST EXCISION Left 2002  . KNEE SURGERY     right  . LAPAROSCOPIC SUPRACERVICAL HYSTERECTOMY  08/05/2013  . ORIF WRIST FRACTURE Left 01/17/2017   Procedure: OPEN REDUCTION INTERNAL FIXATION (ORIF) WRIST FRACTURE;  Surgeon: Lovell Sheehan, MD;  Location: [facility] ORS;  Service: Orthopedics;  Laterality: Left;  .  TUBAL LIGATION Bilateral   . VAGINAL HYSTERECTOMY  03/2006    There were no vitals filed for this visit.  Subjective Assessment - 12/12/18 0928    Subjective  Patient reports she is doing well today. No pain currently. No falls or med changes. Has been compliant with HEP and motivated for progression of sessions. No specific questions or concerns at this time.    Pertinent History  Pt is a 58 y/o F who presents with BLE weakness and difficulty ambulating due to h/o MS that was diagnosed in 1995. Pt denies any pain associated. Pt was admitted to the hospital in November 2018 due to MS exacerbation. Nov-Dec to HHPT-->[facility] until end of Jan. After rehab the pt was able to ambulate up to 100 ft with the RW. Pt has Bil AFO, her therapist at [place] thinks she needs a new AFO as her feet still drag when she ambulates, pt reports HHPT mentioned a spring AFO. Pt has custom AFOs that were originally made 4 years ago. Goals: improve balance, to be able to get into the shower with her tub bench without assist, to be able to get up 2 steps at sister's home with railings on both sides but too far apart to reach both sides, to be able to drive adapted car. Pt is staying at other sister's  house with one step to get inside and currently picking up L leg with UEs and then stepping in. Pt steps down leading with LLE. Pt wants to work on walking side to side as this is challenging and she had not yet progressed to this at rehab. Pt able to get in/out the car with assist only to bring BLEs into the car. Hoping to drive by June or July with adapted car (already has one). Pt needs assist bringing LEs into and out of hospital bed. Pt able to perform sit>stand without assist from [facility] with a pillow on the seat for a boost. Pt denies any falls in the past 6 months. Pt has been increasing her time alone at home up to a few hours at a time to become more independent. Pt has both manual and power WC. Uses power WC at home,  uses manual WC out in community. Pt able to self propel manual WC. Pt needs assist putting pants around ankles but she can pull them up. Pt can put on her shoes but needs help getting her leg up to do so. Pt able to tie her shoelaces on her own. Pt has a tub/shower unit at home with a tub bench. No grab bars in the shower. Has hand held shower head. Pt currently taking a sponge bath. Pt has a regular height toilet with a BSC over top. Pt with h/o L wrist fx with no precautions, pt wore her soft brace to evaluation for comfort but reports her wrist has healed and is doing well following OT.     Currently in Pain?  No/denies          TREATMENT   Ther-ex  in // bars: step over raised theraband 1"-2" x 5 each side.   in // bars:Forward step with 2 rail assist, with LLE back decrease to single UE hold for 10 seconds, with RLE back reduce to no UE supporthold for 10 sec, x2 sets each LE to challenge staggered stance balance, min A for safety; required min Vcs to improve erect posture for better stance control;  in // bars: semi-tandem stance with UE reaches; progress 3-5 reaches  in // bars: weight shift standing with posterior pelvic tilt: hip weight shift, improved knee flexion of RLE;   Seated:  Balloon taps inside/outside BOS reaching with no trunk support x 2 minutes Lateral "ballet" side bends with overreach x 8 each side Reach upwards, outwards and then down for abdominal and postural activation and strengthening x 3 minutes Adduction isometric 10 x 3 second holds Modified LAQ 10x each LE, more challenging for RLE with some assist provided by therapist  Pt educated throughout session about proper posture and technique with exercises. Improved exercise technique, movement at target joints, use of target muscles after minverbal, visual, tactile cues  Seated rest breaks and use of ice pack utilized to keep patient cool due to heat intolerance secondary to MS.   Patient  progressing with functional mobility with decreasing UE support. Repeated similar exercises to last session. She does become fatigued and requires intermittent seated rest breaks with cool pack to help control body temperature. The patient will continue to benefit from skilled therapeutic intervention to address deficits in strength, mobility, balance, and function for improved overall QOL and safety                       PT Short Term Goals - 11/25/18 1249      PT  SHORT TERM GOAL #1   Title  Patient will be compliant with HEP 4-5 days/week for improved carryover between sessions.     Baseline  HEP compliancy between sessions 6/17 compliant    Time  2    Period  Weeks    Status  Achieved    Target Date  10/15/18      PT SHORT TERM GOAL #2   Title  Patient will report no falls/LOB to indicate improved stability with mobility     Baseline  6/17: no falls, one episode of instability 7/20: no falls    Time  2    Period  Weeks    Status  Achieved    Target Date  11/06/18        PT Long Term Goals - 11/25/18 0001      PT LONG TERM GOAL #1   Title  Patient will perform TUG in 40 seconds or less with walker to improve safe negotiation of environment.     Baseline  5/26: 61 seconds 6/17: 60 seconds 7/20: 59 seconds    Time  8    Period  Weeks    Status  Partially Met    Target Date  01/20/19      PT LONG TERM GOAL #2   Title  Pt's 17mT will improve to at least 0.5 m/s for improved safety ambulating in home    Baseline  0.14 m/s; 5/15: .236m  7/10: .36 m/s  8/20: .42 m/s  9/26: 0.2695m10/14: .26 m/s improved hip and knee clearance 11/15: 0.14m48mmproved hip and knee clearance; 12/5: .29 m/s improved step length and forward moment: decreased foot drag. 05/09/2018: 0.21 m/s 2/6: .237 m/s 5/26: .23 m/s 6/17: .24 m/s  7/20: 0.26 m/s    Time  8    Period  Weeks    Status  Partially Met    Target Date  01/20/19      PT LONG TERM GOAL #3   Title  Pt's Bil hip F  strength will improve to at least 2+/5 BLEs for improved gait mechanics and safety    Baseline  L: 2/5, R: 1/5 7/10: 2/5 bilaterally  8/20: 2/5 bilat 9/26: 2/5 bilat 10/14: 2/5 bilaterally ,improved hip flexor muscle activation 2/5: improved hip flexor and quad activation: 12/5: 2/5 ; 05/09/2018: 2/5 2/6: 3/5 knee, 2 to 2+/5 hip 5/26: 2-/5 hips 3/5 knees 6/17: 2-/5 hips 3/5 knees    Time  8    Period  Weeks    Status  Partially Met    Target Date  01/20/19      PT LONG TERM GOAL #4   Title  Pt's ABC Scale will improve to at least 40% to demonstrate improved balance    Baseline  28.13% 5/15; 24.4% 7/10: 23.4% 8/20: 22. 2% 9/26: 25.3% 10/14: 26.5% 11/15: 24.38% 12/5: 26%; 05/09/2018: 29% 2/6: 23.75% 526: 30% 6/17: 26.8%  7/2: 28%    Time  8    Period  Weeks    Status  Partially Met    Target Date  01/20/19      PT LONG TERM GOAL #5   Title  Patient will negotiate one step ~4 inches to enter house independently to increase functional independence in natural environment    Baseline  /20: unable to do 9/26: patient too fatigued/not feeling well to perform 10/14: unable to perform at this time 11/15: unable to perform at this time; 12/5: able to lift 1 inch; 05/09/2018: unable to  clear 4" step, able to lift foot ~ 1in 5/26: has been unable to perform 6/17: able to lift foot 1.25 inch    Time  8    Period  Weeks    Status  On-going    Target Date  01/20/19      PT LONG TERM GOAL #7   Title  Patient will stand 5 minutes to increase standing duration to allow patient to return to washing dishes and clothes.     Baseline  12/5: 56 seconds; 05/09/2018: 1 min 18 sec 2/6: 2 minutes last session 5/26: 1 minute 36 seconds limited by breathing 6/17: terminated due to abdomen pain  7/20 terminated due to overheating    Time  8    Period  Weeks    Status  Partially Met    Target Date  01/20/19      PT LONG TERM GOAL #8   Title  Patient will negotiate 30 ft of changing surfaces outside such as pavement and  parking lot to allow patient to walk into church.    Baseline  12/5: does not walk outside 2/24: unable to test due to rain 5/26: unable since COVID 6/17: raining 7/20 too hot to test    Time  8    Period  Weeks    Status  On-going    Target Date  01/20/19            Plan - 12/12/18 0948    Clinical Impression Statement  Patient progressing with functional mobility with decreasing UE support. Repeated similar exercises to last session. She does become fatigued and requires intermittent seated rest breaks with cool pack to help control body temperature. The patient will continue to benefit from skilled therapeutic intervention to address deficits in strength, mobility, balance, and function for improved overall QOL and safety    Rehab Potential  Good    PT Frequency  2x / week    PT Duration  8 weeks    PT Treatment/Interventions  ADLs/Self Care Home Management;Aquatic Therapy;Biofeedback;Cryotherapy;Electrical Stimulation;Iontophoresis 64m/ml Dexamethasone;Traction;Moist Heat;Ultrasound;DME Instruction;Gait training;Stair training;Functional mobility training;Therapeutic activities;Therapeutic exercise;Balance training;Neuromuscular re-education;Patient/family education;Orthotic Fit/Training;Wheelchair mobility training;Manual techniques;Compression bandaging;Passive range of motion;Dry needling;Energy conservation;Taping;Splinting    PT Next Visit Plan  strength/balance    PT Home Exercise Plan  to initiate next session (please incorporate hip flexor and adductor stretches in HEP if possible)    Consulted and Agree with Plan of Care  Patient       Patient will benefit from skilled therapeutic intervention in order to improve the following deficits and impairments:  Abnormal gait, Decreased activity tolerance, Decreased balance, Decreased coordination, Decreased endurance, Decreased knowledge of use of DME, Decreased mobility, Decreased range of motion, Decreased safety awareness, Decreased  strength, Difficulty walking, Impaired perceived functional ability, Impaired sensation, Impaired tone, Impaired UE functional use, Improper body mechanics, Postural dysfunction  Visit Diagnosis: 1. Muscle weakness (generalized)   2. Other abnormalities of gait and mobility   3. Unsteadiness on feet        Problem List Patient Active Problem List   Diagnosis Date Noted  . Abscess of female pelvis   . [place]   . Radial styloid tenosynovitis 03/12/2018  . Wheelchair confinement 02/27/2018  . Localized osteoporosis with current pathological fracture with routine healing 01/19/2017  . Wrist fracture 01/16/2017  . Sprain of ankle 03/23/2016  . Closed fracture of lateral malleolus 03/16/2016  . Health care maintenance 01/24/2016  . Blood pressure elevated without history of HTN  10/25/2015  . Essential hypertension 10/25/2015  . Multiple sclerosis ([place]) 10/02/2015  . Chronic left shoulder pain 07/19/2015  . Multiple sclerosis exacerbation ([place]) 07/14/2015  . MS (multiple sclerosis) ([place]) 11/26/2014  . Increased body mass index 11/26/2014  . HPV test positive 11/26/2014  . Status post laparoscopic supracervical hysterectomy 11/26/2014  . Galactorrhea 11/26/2014  . Back ache 05/21/2014  . Adiposity 05/21/2014  . Disordered sleep 05/21/2014  . Muscle spasticity 05/21/2014  . Spasticity 05/21/2014  . Calculus of kidney 12/09/2013  . Renal colic 00/04/3934  . Hypercholesteremia 08/19/2013  . Hereditary and idiopathic neuropathy 08/19/2013  . Hypercholesterolemia without hypertriglyceridemia 08/19/2013  . Bladder infection, chronic 07/25/2012  . Disorder of bladder function 07/25/2012  . Incomplete bladder emptying 07/25/2012  . Microscopic hematuria 07/25/2012  . Right upper quadrant pain 07/25/2012   Phillips Grout PT, DPT, GCS  , 12/12/2018, 1:43 PM  [facility] SERVICES [address] [place], [place], [address] Phone: [phone]   Fax:  [phone]  Name: Lisa Williams MRN: [id number] Date of Birth: [birth year]

## 2018-12-16 ENCOUNTER — Ambulatory Visit: Payer: Medicare HMO

## 2018-12-16 ENCOUNTER — Other Ambulatory Visit: Payer: Self-pay

## 2018-12-16 DIAGNOSIS — M6281 Muscle weakness (generalized): Secondary | ICD-10-CM

## 2018-12-16 DIAGNOSIS — R2689 Other abnormalities of gait and mobility: Secondary | ICD-10-CM

## 2018-12-16 DIAGNOSIS — R2681 Unsteadiness on feet: Secondary | ICD-10-CM

## 2018-12-16 NOTE — Therapy (Signed)
[place] MAIN [facility] SERVICES [address] [place], [place], [address] Phone: [phone]   Fax:  [phone]  Physical Therapy Treatment  Patient Details  Name: Lisa Williams MRN: [id number] Date of Birth: 10-06-1960 Referring Provider (PT): Gurney Maxin, MD   Encounter Date: 12/16/2018  PT End of Session - 12/16/18 1240    Visit Number  [id number]    Number of Visits  115    Date for PT Re-Evaluation  01/20/19    Authorization Type  5/10 PN start 7/22    PT Start Time  1115    PT Stop Time  1155    PT Time Calculation (min)  40 min    Equipment Utilized During Treatment  Gait belt    Activity Tolerance  Patient tolerated treatment well;Other (comment)   heat intolerance   Behavior During Therapy  WFL for tasks assessed/performed       Past Medical History:  Diagnosis Date  . Abdominal pain, right upper quadrant   . Back pain   . Calculus of kidney 12/09/2013  . Chronic back pain    unspecified  . Chronic left shoulder pain 07/19/2015  . Functional disorder of bladder    other  . Galactorrhea 11/26/2014   Chronic   . Hereditary and idiopathic neuropathy 08/19/2013  . HPV test positive   . Hypercholesteremia 08/19/2013  . Incomplete bladder emptying   . Microscopic hematuria   . MS (multiple sclerosis) ([place])   . Muscle spasticity 05/21/2014  . Nonspecific findings on examination of urine    other  . Osteopenia   . Status post laparoscopic supracervical hysterectomy 11/26/2014  . Tobacco user 11/26/2014  . Wrist fracture     Past Surgical History:  Procedure Laterality Date  . bilateral tubal ligation  1996  . BREAST CYST EXCISION Left 2002  . KNEE SURGERY     right  . LAPAROSCOPIC SUPRACERVICAL HYSTERECTOMY  08/05/2013  . ORIF WRIST FRACTURE Left 01/17/2017   Procedure: OPEN REDUCTION INTERNAL FIXATION (ORIF) WRIST FRACTURE;  Surgeon: Lovell Sheehan, MD;  Location: [facility] ORS;  Service: Orthopedics;  Laterality: Left;   . TUBAL LIGATION Bilateral   . VAGINAL HYSTERECTOMY  03/2006    There were no vitals filed for this visit.  Subjective Assessment - 12/16/18 1238    Subjective  Patient reports she is doing well with no pain or concerns. Has been compliant with HEP. No falls or LOB since last session.    Pertinent History  Pt is a 58 y/o F who presents with BLE weakness and difficulty ambulating due to h/o MS that was diagnosed in 1995. Pt denies any pain associated. Pt was admitted to the hospital in November 2018 due to MS exacerbation. Nov-Dec to HHPT-->[facility] until end of Jan. After rehab the pt was able to ambulate up to 100 ft with the RW. Pt has Bil AFO, her therapist at [facility] thinks she needs a new AFO as her feet still drag when she ambulates, pt reports HHPT mentioned a spring AFO. Pt has custom AFOs that were originally made 4 years ago. Goals: improve balance, to be able to get into the shower with her tub bench without assist, to be able to get up 2 steps at sister's home with railings on both sides but too far apart to reach both sides, to be able to drive adapted car. Pt is staying at other sister's house with one step to get inside and currently picking up  L leg with UEs and then stepping in. Pt steps down leading with LLE. Pt wants to work on walking side to side as this is challenging and she had not yet progressed to this at rehab. Pt able to get in/out the car with assist only to bring BLEs into the car. Hoping to drive by June or July with adapted car (already has one). Pt needs assist bringing LEs into and out of hospital bed. Pt able to perform sit>stand without assist from [facility] with a pillow on the seat for a boost. Pt denies any falls in the past 6 months. Pt has been increasing her time alone at home up to a few hours at a time to become more independent. Pt has both manual and power WC. Uses power WC at home, uses manual WC out in community. Pt able to self propel manual WC. Pt needs  assist putting pants around ankles but she can pull them up. Pt can put on her shoes but needs help getting her leg up to do so. Pt able to tie her shoelaces on her own. Pt has a tub/shower unit at home with a tub bench. No grab bars in the shower. Has hand held shower head. Pt currently taking a sponge bath. Pt has a regular height toilet with a BSC over top. Pt with h/o L wrist fx with no precautions, pt wore her soft brace to evaluation for comfort but reports her wrist has healed and is doing well following OT.     Currently in Pain?  No/denies            Treatment:  Standing: ambulate length of // bars 2x with UE support and cueing for weight shift.   Standing ambulate length of // bars with focus on hip/knee flexion for increased foot clearance with decreased velocity for maximal step length/clearance x2 lengths    in // bars: step over raised theraband 1.5 5x each side. able to clear 5x each side.     Sit to stand technique with UE support, decreased reliance upon UE's with improved eccentric control, no "plopping". Multiple performances throughout session    Seated:  Upright posture with straight arm raise with lateral movement at top of arm raise (T shape ) 8x     Pt educated throughout session about proper posture and technique with exercises. Improved exercise technique, movement at target joints, use of target muscles after min verbal, visual, tactile cues   Seated rest breaks and use of ice pack utilized to keep body temperature at functional range to reduce MS exacerbation.                      PT Education - 12/16/18 1239    Education provided  Yes    Education Details  exercise technique, stability    Person(s) Educated  Patient    Methods  Explanation;Demonstration;Tactile cues;Verbal cues    Comprehension  Verbalized understanding;Returned demonstration;Verbal cues required;Tactile cues required       PT Short Term Goals - 11/25/18 1249       PT SHORT TERM GOAL #1   Title  Patient will be compliant with HEP 4-5 days/week for improved carryover between sessions.     Baseline  HEP compliancy between sessions 6/17 compliant    Time  2    Period  Weeks    Status  Achieved    Target Date  10/15/18      PT SHORT TERM GOAL #2  Title  Patient will report no falls/LOB to indicate improved stability with mobility     Baseline  6/17: no falls, one episode of instability 7/20: no falls    Time  2    Period  Weeks    Status  Achieved    Target Date  11/06/18        PT Long Term Goals - 11/25/18 0001      PT LONG TERM GOAL #1   Title  Patient will perform TUG in 40 seconds or less with walker to improve safe negotiation of environment.     Baseline  5/26: 61 seconds 6/17: 60 seconds 7/20: 59 seconds    Time  8    Period  Weeks    Status  Partially Met    Target Date  01/20/19      PT LONG TERM GOAL #2   Title  Pt's 34mT will improve to at least 0.5 m/s for improved safety ambulating in home    Baseline  0.14 m/s; 5/15: .258m  7/10: .36 m/s  8/20: .42 m/s  9/26: 0.2664m10/14: .26 m/s improved hip and knee clearance 11/15: 0.65m62mmproved hip and knee clearance; 12/5: .29 m/s improved step length and forward moment: decreased foot drag. 05/09/2018: 0.21 m/s 2/6: .237 m/s 5/26: .23 m/s 6/17: .24 m/s  7/20: 0.26 m/s    Time  8    Period  Weeks    Status  Partially Met    Target Date  01/20/19      PT LONG TERM GOAL #3   Title  Pt's Bil hip F strength will improve to at least 2+/5 BLEs for improved gait mechanics and safety    Baseline  L: 2/5, R: 1/5 7/10: 2/5 bilaterally  8/20: 2/5 bilat 9/26: 2/5 bilat 10/14: 2/5 bilaterally ,improved hip flexor muscle activation 2/5: improved hip flexor and quad activation: 12/5: 2/5 ; 05/09/2018: 2/5 2/6: 3/5 knee, 2 to 2+/5 hip 5/26: 2-/5 hips 3/5 knees 6/17: 2-/5 hips 3/5 knees    Time  8    Period  Weeks    Status  Partially Met    Target Date  01/20/19      PT LONG TERM GOAL #4    Title  Pt's ABC Scale will improve to at least 40% to demonstrate improved balance    Baseline  28.13% 5/15; 24.4% 7/10: 23.4% 8/20: 22. 2% 9/26: 25.3% 10/14: 26.5% 11/15: 24.38% 12/5: 26%; 05/09/2018: 29% 2/6: 23.75% 526: 30% 6/17: 26.8%  7/2: 28%    Time  8    Period  Weeks    Status  Partially Met    Target Date  01/20/19      PT LONG TERM GOAL #5   Title  Patient will negotiate one step ~4 inches to enter house independently to increase functional independence in natural environment    Baseline  /20: unable to do 9/26: patient too fatigued/not feeling well to perform 10/14: unable to perform at this time 11/15: unable to perform at this time; 12/5: able to lift 1 inch; 05/09/2018: unable to clear 4" step, able to lift foot ~ 1in 5/26: has been unable to perform 6/17: able to lift foot 1.25 inch    Time  8    Period  Weeks    Status  On-going    Target Date  01/20/19      PT LONG TERM GOAL #7   Title  Patient will stand 5 minutes to increase standing duration  to allow patient to return to washing dishes and clothes.     Baseline  12/5: 56 seconds; 05/09/2018: 1 min 18 sec 2/6: 2 minutes last session 5/26: 1 minute 36 seconds limited by breathing 6/17: terminated due to abdomen pain  7/20 terminated due to overheating    Time  8    Period  Weeks    Status  Partially Met    Target Date  01/20/19      PT LONG TERM GOAL #8   Title  Patient will negotiate 30 ft of changing surfaces outside such as pavement and parking lot to allow patient to walk into church.    Baseline  12/5: does not walk outside 2/24: unable to test due to rain 5/26: unable since COVID 6/17: raining 7/20 too hot to test    Time  8    Period  Weeks    Status  On-going    Target Date  01/20/19            Plan - 12/16/18 1246    Clinical Impression Statement  Patient is demonstrating improved ability to clear bilateral feet with decreasing trunk compensatory mechanics. She required seated rest breaks after more  challenging standing interventions due to increased body temperature and fatigue. The patient will continue to benefit from skilled therapeutic intervention to address deficits in strength, mobility, balance, and function for improved overall QOL and safety    Rehab Potential  Good    PT Frequency  2x / week    PT Duration  8 weeks    PT Treatment/Interventions  ADLs/Self Care Home Management;Aquatic Therapy;Biofeedback;Cryotherapy;Electrical Stimulation;Iontophoresis 35m/ml Dexamethasone;Traction;Moist Heat;Ultrasound;DME Instruction;Gait training;Stair training;Functional mobility training;Therapeutic activities;Therapeutic exercise;Balance training;Neuromuscular re-education;Patient/family education;Orthotic Fit/Training;Wheelchair mobility training;Manual techniques;Compression bandaging;Passive range of motion;Dry needling;Energy conservation;Taping;Splinting    PT Next Visit Plan  strength/balance    PT Home Exercise Plan  to initiate next session (please incorporate hip flexor and adductor stretches in HEP if possible)    Consulted and Agree with Plan of Care  Patient       Patient will benefit from skilled therapeutic intervention in order to improve the following deficits and impairments:  Abnormal gait, Decreased activity tolerance, Decreased balance, Decreased coordination, Decreased endurance, Decreased knowledge of use of DME, Decreased mobility, Decreased range of motion, Decreased safety awareness, Decreased strength, Difficulty walking, Impaired perceived functional ability, Impaired sensation, Impaired tone, Impaired UE functional use, Improper body mechanics, Postural dysfunction  Visit Diagnosis: 1. Muscle weakness (generalized)   2. Other abnormalities of gait and mobility   3. Unsteadiness on feet        Problem List Patient Active Problem List   Diagnosis Date Noted  . Abscess of female pelvis   . SVT (supraventricular tachycardia) ([place]   . Radial styloid  tenosynovitis 03/12/2018  . Wheelchair confinement 02/27/2018  . Localized osteoporosis with current pathological fracture with routine healing 01/19/2017  . Wrist fracture 01/16/2017  . Sprain of ankle 03/23/2016  . Closed fracture of lateral malleolus 03/16/2016  . Health care maintenance 01/24/2016  . Blood pressure elevated without history of HTN 10/25/2015  . Essential hypertension 10/25/2015  . [place] 10/02/2015  . Chronic left shoulder pain 07/19/2015  . Multiple sclerosis exacerbation ([place] 07/14/2015  . MS (multiple sclerosis) ([place] 11/26/2014  . Increased body mass index 11/26/2014  . HPV test positive 11/26/2014  . Status post laparoscopic supracervical hysterectomy 11/26/2014  . Galactorrhea 11/26/2014  . Back ache 05/21/2014  . Adiposity 05/21/2014  . Disordered sleep 05/21/2014  .  Muscle spasticity 05/21/2014  . Spasticity 05/21/2014  . Calculus of kidney 12/09/2013  . Renal colic 27/61/8485  . Hypercholesteremia 08/19/2013  . Hereditary and idiopathic neuropathy 08/19/2013  . Hypercholesterolemia without hypertriglyceridemia 08/19/2013  . Bladder infection, chronic 07/25/2012  . Disorder of bladder function 07/25/2012  . Incomplete bladder emptying 07/25/2012  . Microscopic hematuria 07/25/2012  . Right upper quadrant pain 07/25/2012   Janna Arch, PT, DPT   12/16/2018, 12:48 PM  [place] MAIN [facility] SERVICES [address] [place], [place], [address] Phone: [phone]   Fax:  [phone]  Name: Kadience Macchi Zietlow MRN: [id number] Date of Birth: 21-Feb-1961

## 2018-12-18 ENCOUNTER — Ambulatory Visit: Payer: Medicare HMO

## 2018-12-18 ENCOUNTER — Other Ambulatory Visit: Payer: Self-pay

## 2018-12-18 DIAGNOSIS — R2689 Other abnormalities of gait and mobility: Secondary | ICD-10-CM

## 2018-12-18 DIAGNOSIS — M6281 Muscle weakness (generalized): Secondary | ICD-10-CM | POA: Diagnosis not present

## 2018-12-18 DIAGNOSIS — R2681 Unsteadiness on feet: Secondary | ICD-10-CM

## 2018-12-18 NOTE — Therapy (Signed)
[place] [facility] SERVICES [place], [place], [address] Phone: [phone]   Fax:  [phone]  Physical Therapy Treatment  Patient Details  Name: Lisa Williams MRN: [id number] Date of Birth: 28-Mar-1961 Referring Provider (PT): Gurney Maxin, MD   Encounter Date: 12/18/2018  PT End of Session - 12/18/18 1237    Visit Number  [id number]    Number of Visits  115    Date for PT Re-Evaluation  01/20/19    Authorization Type  6/10 PN start 7/22    PT Start Time  1115    PT Stop Time  1157    PT Time Calculation (min)  42 min    Equipment Utilized During Treatment  Gait belt    Activity Tolerance  Patient tolerated treatment well;Other (comment)   heat intolerance   Behavior During Therapy  WFL for tasks assessed/performed       Past Medical History:  Diagnosis Date  . Abdominal pain, right upper quadrant   . Back pain   . Calculus of kidney 12/09/2013  . Chronic back pain    unspecified  . Chronic left shoulder pain 07/19/2015  . Functional disorder of bladder    other  . Galactorrhea 11/26/2014   Chronic   . Hereditary and idiopathic neuropathy 08/19/2013  . HPV test positive   . Hypercholesteremia 08/19/2013  . Incomplete bladder emptying   . Microscopic hematuria   . MS (multiple sclerosis) ([place])   . Muscle spasticity 05/21/2014  . Nonspecific findings on examination of urine    other  . Osteopenia   . Status post laparoscopic supracervical hysterectomy 11/26/2014  . Tobacco user 11/26/2014  . Wrist fracture     Past Surgical History:  Procedure Laterality Date  . bilateral tubal ligation  1996  . BREAST CYST EXCISION Left 2002  . KNEE SURGERY     right  . LAPAROSCOPIC SUPRACERVICAL HYSTERECTOMY  08/05/2013  . ORIF WRIST FRACTURE Left 01/17/2017   Procedure: OPEN REDUCTION INTERNAL FIXATION (ORIF) WRIST FRACTURE;  Surgeon: Lovell Sheehan, MD;  Location: [facility] ORS;  Service: Orthopedics;  Laterality: Left;   . TUBAL LIGATION Bilateral   . VAGINAL HYSTERECTOMY  03/2006    There were no vitals filed for this visit.  Subjective Assessment - 12/18/18 1235    Subjective  Patient reports no falls since last session. Patient reports waking up last night feeling overheated but was able to cool herself down. HEP compliant.    Pertinent History  Pt is a 58 y/o F who presents with BLE weakness and difficulty ambulating due to h/o MS that was diagnosed in 1995. Pt denies any pain associated. Pt was admitted to the hospital in November 2018 due to MS exacerbation. Nov-Dec to HHPT-->[facility] until end of Jan. After rehab the pt was able to ambulate up to 100 ft with the RW. Pt has Bil AFO, her therapist at [facility] thinks she needs a new AFO as her feet still drag when she ambulates, pt reports HHPT mentioned a spring AFO. Pt has custom AFOs that were originally made 4 years ago. Goals: improve balance, to be able to get into the shower with her tub bench without assist, to be able to get up 2 steps at sister's home with railings on both sides but too far apart to reach both sides, to be able to drive adapted car. Pt is staying at other sister's house with one step to get inside and currently picking  up L leg with UEs and then stepping in. Pt steps down leading with LLE. Pt wants to work on walking side to side as this is challenging and she had not yet progressed to this at rehab. Pt able to get in/out the car with assist only to bring BLEs into the car. Hoping to drive by June or July with adapted car (already has one). Pt needs assist bringing LEs into and out of hospital bed. Pt able to perform sit>stand without assist from [facility] with a pillow on the seat for a boost. Pt denies any falls in the past 6 months. Pt has been increasing her time alone at home up to a few hours at a time to become more independent. Pt has both manual and power WC. Uses power WC at home, uses manual WC out in community. Pt able to self  propel manual WC. Pt needs assist putting pants around ankles but she can pull them up. Pt can put on her shoes but needs help getting her leg up to do so. Pt able to tie her shoelaces on her own. Pt has a tub/shower unit at home with a tub bench. No grab bars in the shower. Has hand held shower head. Pt currently taking a sponge bath. Pt has a regular height toilet with a BSC over top. Pt with h/o L wrist fx with no precautions, pt wore her soft brace to evaluation for comfort but reports her wrist has healed and is doing well following OT.     Currently in Pain?  No/denies       Standing with RW: Standing weight shifts with focus on trunk alignment with hip shift for increasing weight acceptance. X 10 each side Step forward rock/shift weight forwards and backwards with df/pf and pelvis shift x3 rocks each step, step back and repeat x 4 each LE, very challenging requiring seated rest break between L and RLE Step up/hip flexion; unable to perform with RW at this time,   Seated:  3000gr weighted ball  -upright press with elbows adducted under wrists 12x upright posture away from back of chair  -ball toss inside and outside BOS with upright posture and abdominal activation   -half circle twists touching outside of each knee for challenge to lateral trunk and abdominal musculature x 6 each side  2lb dumbbells - cross body jabs with scapular depression for posterior musculature activation with back unsupported for postural strengthening. X 10  -cross body adduction with bent elbow x 10 each side with trunk unsupported for abdominal/postural strengthening -uppercut to PT hands with back unsupported 10x each side  RTB -seated ER with back unsupported for increased challenge to stability x 10 x 2 trials -seated abduction 10x, improved muscle recruitment  Pt educated throughout session about proper posture and technique with exercises. Improved exercise technique, movement at target joints, use of  target muscles after min verbal, visual, tactile cues   Seated rest breaks and use of ice pack utilized to keep body temperature at functional range to reduce MS exacerbation.                         PT Education - 12/18/18 1236    Education provided  Yes    Education Details  exercise technique, stability    Person(s) Educated  Patient    Methods  Explanation;Demonstration;Tactile cues;Verbal cues    Comprehension  Verbalized understanding;Returned demonstration;Verbal cues required;Tactile cues required;Need further instruction  PT Short Term Goals - 11/25/18 1249      PT SHORT TERM GOAL #1   Title  Patient will be compliant with HEP 4-5 days/week for improved carryover between sessions.     Baseline  HEP compliancy between sessions 6/17 compliant    Time  2    Period  Weeks    Status  Achieved    Target Date  10/15/18      PT SHORT TERM GOAL #2   Title  Patient will report no falls/LOB to indicate improved stability with mobility     Baseline  6/17: no falls, one episode of instability 7/20: no falls    Time  2    Period  Weeks    Status  Achieved    Target Date  11/06/18        PT Long Term Goals - 11/25/18 0001      PT LONG TERM GOAL #1   Title  Patient will perform TUG in 40 seconds or less with walker to improve safe negotiation of environment.     Baseline  5/26: 61 seconds 6/17: 60 seconds 7/20: 59 seconds    Time  8    Period  Weeks    Status  Partially Met    Target Date  01/20/19      PT LONG TERM GOAL #2   Title  Pt's 73mT will improve to at least 0.5 m/s for improved safety ambulating in home    Baseline  0.14 m/s; 5/15: .258m  7/10: .36 m/s  8/20: .42 m/s  9/26: 0.2650m10/14: .26 m/s improved hip and knee clearance 11/15: 0.18m7mmproved hip and knee clearance; 12/5: .29 m/s improved step length and forward moment: decreased foot drag. 05/09/2018: 0.21 m/s 2/6: .237 m/s 5/26: .23 m/s 6/17: .24 m/s  7/20: 0.26 m/s    Time  8     Period  Weeks    Status  Partially Met    Target Date  01/20/19      PT LONG TERM GOAL #3   Title  Pt's Bil hip F strength will improve to at least 2+/5 BLEs for improved gait mechanics and safety    Baseline  L: 2/5, R: 1/5 7/10: 2/5 bilaterally  8/20: 2/5 bilat 9/26: 2/5 bilat 10/14: 2/5 bilaterally ,improved hip flexor muscle activation 2/5: improved hip flexor and quad activation: 12/5: 2/5 ; 05/09/2018: 2/5 2/6: 3/5 knee, 2 to 2+/5 hip 5/26: 2-/5 hips 3/5 knees 6/17: 2-/5 hips 3/5 knees    Time  8    Period  Weeks    Status  Partially Met    Target Date  01/20/19      PT LONG TERM GOAL #4   Title  Pt's ABC Scale will improve to at least 40% to demonstrate improved balance    Baseline  28.13% 5/15; 24.4% 7/10: 23.4% 8/20: 22. 2% 9/26: 25.3% 10/14: 26.5% 11/15: 24.38% 12/5: 26%; 05/09/2018: 29% 2/6: 23.75% 526: 30% 6/17: 26.8%  7/2: 28%    Time  8    Period  Weeks    Status  Partially Met    Target Date  01/20/19      PT LONG TERM GOAL #5   Title  Patient will negotiate one step ~4 inches to enter house independently to increase functional independence in natural environment    Baseline  /20: unable to do 9/26: patient too fatigued/not feeling well to perform 10/14: unable to perform at this time 11/15: unable to  perform at this time; 12/5: able to lift 1 inch; 05/09/2018: unable to clear 4" step, able to lift foot ~ 1in 5/26: has been unable to perform 6/17: able to lift foot 1.25 inch    Time  8    Period  Weeks    Status  On-going    Target Date  01/20/19      PT LONG TERM GOAL #7   Title  Patient will stand 5 minutes to increase standing duration to allow patient to return to washing dishes and clothes.     Baseline  12/5: 56 seconds; 05/09/2018: 1 min 18 sec 2/6: 2 minutes last session 5/26: 1 minute 36 seconds limited by breathing 6/17: terminated due to abdomen pain  7/20 terminated due to overheating    Time  8    Period  Weeks    Status  Partially Met    Target Date  01/20/19       PT LONG TERM GOAL #8   Title  Patient will negotiate 30 ft of changing surfaces outside such as pavement and parking lot to allow patient to walk into church.    Baseline  12/5: does not walk outside 2/24: unable to test due to rain 5/26: unable since COVID 6/17: raining 7/20 too hot to test    Time  8    Period  Weeks    Status  On-going    Target Date  01/20/19            Plan - 12/18/18 1238    Clinical Impression Statement  Patient presents with excellent motivation throughout session. She continues to progress with functional mobility with transition from parallel bars to RW increasing her reliance upon her LE's rather than her UE's for support. The patient will continue to benefit from skilled therapeutic intervention to address deficits in strength, mobility, balance, and function for improved overall QOL and safety    Rehab Potential  Good    PT Frequency  2x / week    PT Duration  8 weeks    PT Treatment/Interventions  ADLs/Self Care Home Management;Aquatic Therapy;Biofeedback;Cryotherapy;Electrical Stimulation;Iontophoresis 84m/ml Dexamethasone;Traction;Moist Heat;Ultrasound;DME Instruction;Gait training;Stair training;Functional mobility training;Therapeutic activities;Therapeutic exercise;Balance training;Neuromuscular re-education;Patient/family education;Orthotic Fit/Training;Wheelchair mobility training;Manual techniques;Compression bandaging;Passive range of motion;Dry needling;Energy conservation;Taping;Splinting    PT Next Visit Plan  strength/balance    PT Home Exercise Plan  to initiate next session (please incorporate hip flexor and adductor stretches in HEP if possible)    Consulted and Agree with Plan of Care  Patient       Patient will benefit from skilled therapeutic intervention in order to improve the following deficits and impairments:  Abnormal gait, Decreased activity tolerance, Decreased balance, Decreased coordination, Decreased endurance, Decreased  knowledge of use of DME, Decreased mobility, Decreased range of motion, Decreased safety awareness, Decreased strength, Difficulty walking, Impaired perceived functional ability, Impaired sensation, Impaired tone, Impaired UE functional use, Improper body mechanics, Postural dysfunction  Visit Diagnosis: 1. Muscle weakness (generalized)   2. Other abnormalities of gait and mobility   3. Unsteadiness on feet        Problem List Patient Active Problem List   Diagnosis Date Noted  . Abscess of female pelvis   . SVT (supraventricular tachycardia) ([place]   . Radial styloid tenosynovitis 03/12/2018  . Wheelchair confinement 02/27/2018  . Localized osteoporosis with current pathological fracture with routine healing 01/19/2017  . Wrist fracture 01/16/2017  . Sprain of ankle 03/23/2016  . Closed fracture of lateral malleolus 03/16/2016  .  Health care maintenance 01/24/2016  . Blood pressure elevated without history of HTN 10/25/2015  . Essential hypertension 10/25/2015  . Multiple sclerosis ([place]) 10/02/2015  . Chronic left shoulder pain 07/19/2015  . Multiple sclerosis exacerbation ([place]) 07/14/2015  . MS (multiple sclerosis) ([place]) 11/26/2014  . Increased body mass index 11/26/2014  . HPV test positive 11/26/2014  . Status post laparoscopic supracervical hysterectomy 11/26/2014  . Galactorrhea 11/26/2014  . Back ache 05/21/2014  . Adiposity 05/21/2014  . Disordered sleep 05/21/2014  . Muscle spasticity 05/21/2014  . Spasticity 05/21/2014  . Calculus of kidney 12/09/2013  . Renal colic 11/65/4612  . Hypercholesteremia 08/19/2013  . Hereditary and idiopathic neuropathy 08/19/2013  . Hypercholesterolemia without hypertriglyceridemia 08/19/2013  . Bladder infection, chronic 07/25/2012  . Disorder of bladder function 07/25/2012  . Incomplete bladder emptying 07/25/2012  . Microscopic hematuria 07/25/2012  . Right upper quadrant pain 07/25/2012    Janna Arch, PT,  DPT   12/18/2018, 12:39 PM  [place] [facility] SERVICES [address] [place], [place], [address] Phone: [phone]   Fax:  [phone]  Name: Glorine Hanratty [place]oint MRN: [id number] Date of Birth: 1960/09/29

## 2018-12-23 ENCOUNTER — Other Ambulatory Visit: Payer: Self-pay

## 2018-12-23 ENCOUNTER — Ambulatory Visit: Payer: Medicare HMO

## 2018-12-23 DIAGNOSIS — M6281 Muscle weakness (generalized): Secondary | ICD-10-CM

## 2018-12-23 DIAGNOSIS — R2681 Unsteadiness on feet: Secondary | ICD-10-CM

## 2018-12-23 DIAGNOSIS — R2689 Other abnormalities of gait and mobility: Secondary | ICD-10-CM

## 2018-12-23 NOTE — Therapy (Signed)
[place] MAIN [facility] [address] [place], [place], [address] Phone: [phone]   Fax:  [phone]  Physical Therapy Treatment  Patient Details  Name: Lisa Williams MRN: [id number] Date of Birth: 01-01-1961 Referring Provider (PT): Gurney Maxin, MD   Encounter Date: 12/23/2018  PT End of Session - 12/23/18 1223    Visit Number  [id number]    Number of Visits  115    Date for PT Re-Evaluation  01/20/19    Authorization Type  7/10 PN start 7/22    PT Start Time  1116    PT Stop Time  1200    PT Time Calculation (min)  44 min    Equipment Utilized During Treatment  Gait belt    Activity Tolerance  Patient tolerated treatment well;Other (comment)   heat intolerance   Behavior During Therapy  WFL for tasks assessed/performed       Past Medical History:  Diagnosis Date  . Abdominal pain, right upper quadrant   . Back pain   . Calculus of kidney 12/09/2013  . Chronic back pain    unspecified  . Chronic left shoulder pain 07/19/2015  . Functional disorder of bladder    other  . Galactorrhea 11/26/2014   Chronic   . Hereditary and idiopathic neuropathy 08/19/2013  . HPV test positive   . Hypercholesteremia 08/19/2013  . Incomplete bladder emptying   . Microscopic hematuria   . MS (multiple sclerosis) ([place])   . Muscle spasticity 05/21/2014  . Nonspecific findings on examination of urine    other  . Osteopenia   . Status post laparoscopic supracervical hysterectomy 11/26/2014  . Tobacco user 11/26/2014  . Wrist fracture     Past Surgical History:  Procedure Laterality Date  . bilateral tubal ligation  1996  . BREAST CYST EXCISION Left 2002  . KNEE SURGERY     right  . LAPAROSCOPIC SUPRACERVICAL HYSTERECTOMY  08/05/2013  . ORIF WRIST FRACTURE Left 01/17/2017   Procedure: OPEN REDUCTION INTERNAL FIXATION (ORIF) WRIST FRACTURE;  Surgeon: Lovell Sheehan, MD;  Location: [facility] ORS;  Service: Orthopedics;  Laterality: Left;   . TUBAL LIGATION Bilateral   . VAGINAL HYSTERECTOMY  03/2006    There were no vitals filed for this visit.  Subjective Assessment - 12/23/18 1222    Subjective  Patient reports she is doing well, is compliant with HEP. No falls or LOB since last session.    Pertinent History  Pt is a 58 y/o F who presents with BLE weakness and difficulty ambulating due to h/o MS that was diagnosed in 1995. Pt denies any pain associated. Pt was admitted to the hospital in November 2018 due to MS exacerbation. Nov-Dec to HHPT-->[facility] until end of Jan. After rehab the pt was able to ambulate up to 100 ft with the RW. Pt has Bil AFO, her therapist at [facility] thinks she needs a new AFO as her feet still drag when she ambulates, pt reports HHPT mentioned a spring AFO. Pt has custom AFOs that were originally made 4 years ago. Goals: improve balance, to be able to get into the shower with her tub bench without assist, to be able to get up 2 steps at sister's home with railings on both sides but too far apart to reach both sides, to be able to drive adapted car. Pt is staying at other sister's house with one step to get inside and currently picking up L leg with UEs and then  stepping in. Pt steps down leading with LLE. Pt wants to work on walking side to side as this is challenging and she had not yet progressed to this at rehab. Pt able to get in/out the car with assist only to bring BLEs into the car. Hoping to drive by June or July with adapted car (already has one). Pt needs assist bringing LEs into and out of hospital bed. Pt able to perform sit>stand without assist from [facility] with a pillow on the seat for a boost. Pt denies any falls in the past 6 months. Pt has been increasing her time alone at home up to a few hours at a time to become more independent. Pt has both manual and power WC. Uses power WC at home, uses manual WC out in community. Pt able to self propel manual WC. Pt needs assist putting pants around  ankles but she can pull them up. Pt can put on her shoes but needs help getting her leg up to do so. Pt able to tie her shoelaces on her own. Pt has a tub/shower unit at home with a tub bench. No grab bars in the shower. Has hand held shower head. Pt currently taking a sponge bath. Pt has a regular height toilet with a BSC over top. Pt with h/o L wrist fx with no precautions, pt wore her soft brace to evaluation for comfort but reports her wrist has healed and is doing well following OT.     Currently in Pain?  No/denies              in // bars: 2" PYK:DXIP up onto unstable mat with BUE support, x6 trails each LE, challenge with foot clearance this session, able to perform hip flexion however has limited knee flexion with combined hip flexion.     in // bars: semi-tandem stance with SUE support and horizontal head turns 30 seconds. 2 sets each LE, seated rest break between first and second set cueing for abdominal activation for maximal postural alignment  in // bars: weight shift standing with posterior pelvic tilt: hip weight shift, improved knee flexion of RLE;    Sit to stand technique with UE support, decreased reliance upon UE's with improved eccentric control, no "plopping". Multiple performances throughout session    Seated: Upright posture with straight arm raise with lateral movement at top of arm raise (T shape ) 8x ; cueing for scapular retraction    Weighted ball (2000 Gr)      -half circle twists touching outside of each knee for challenge to lateral trunk and abdominal musculature x 6 each side -diagonal pulse at end range of trunk flexion 10x each side of knee for optimal postural muscle activation  -large circles 10x clockwise 10x counterclockwise " stirring a pot)    Pt educated throughout session about proper posture and technique with exercises. Improved exercise technique, movement at target joints, use of target muscles after minverbal, visual, tactile  cues  Seated rest breaks and use of ice pack utilized to keep body temperature at functional range to reduce MS exacerbation.     PT Education - 12/23/18 1222    Education provided  Yes    Education Details  exercise technique ; stability    Person(s) Educated  Patient    Methods  Explanation;Demonstration;Tactile cues;Verbal cues    Comprehension  Verbalized understanding;Returned demonstration;Verbal cues required;Tactile cues required;Need further instruction       PT Short Term Goals - 11/25/18 1249  PT SHORT TERM GOAL #1   Title  Patient will be compliant with HEP 4-5 days/week for improved carryover between sessions.     Baseline  HEP compliancy between sessions 6/17 compliant    Time  2    Period  Weeks    Status  Achieved    Target Date  10/15/18      PT SHORT TERM GOAL #2   Title  Patient will report no falls/LOB to indicate improved stability with mobility     Baseline  6/17: no falls, one episode of instability 7/20: no falls    Time  2    Period  Weeks    Status  Achieved    Target Date  11/06/18        PT Long Term Goals - 11/25/18 0001      PT LONG TERM GOAL #1   Title  Patient will perform TUG in 40 seconds or less with walker to improve safe negotiation of environment.     Baseline  5/26: 61 seconds 6/17: 60 seconds 7/20: 59 seconds    Time  8    Period  Weeks    Status  Partially Met    Target Date  01/20/19      PT LONG TERM GOAL #2   Title  Pt's 49mT will improve to at least 0.5 m/s for improved safety ambulating in home    Baseline  0.14 m/s; 5/15: .254m  7/10: .36 m/s  8/20: .42 m/s  9/26: 0.2642m10/14: .26 m/s improved hip and knee clearance 11/15: 0.9m80mmproved hip and knee clearance; 12/5: .29 m/s improved step length and forward moment: decreased foot drag. 05/09/2018: 0.21 m/s 2/6: .237 m/s 5/26: .23 m/s 6/17: .24 m/s  7/20: 0.26 m/s    Time  8    Period  Weeks    Status  Partially Met    Target Date  01/20/19      PT LONG TERM  GOAL #3   Title  Pt's Bil hip F strength will improve to at least 2+/5 BLEs for improved gait mechanics and safety    Baseline  L: 2/5, R: 1/5 7/10: 2/5 bilaterally  8/20: 2/5 bilat 9/26: 2/5 bilat 10/14: 2/5 bilaterally ,improved hip flexor muscle activation 2/5: improved hip flexor and quad activation: 12/5: 2/5 ; 05/09/2018: 2/5 2/6: 3/5 knee, 2 to 2+/5 hip 5/26: 2-/5 hips 3/5 knees 6/17: 2-/5 hips 3/5 knees    Time  8    Period  Weeks    Status  Partially Met    Target Date  01/20/19      PT LONG TERM GOAL #4   Title  Pt's ABC Scale will improve to at least 40% to demonstrate improved balance    Baseline  28.13% 5/15; 24.4% 7/10: 23.4% 8/20: 22. 2% 9/26: 25.3% 10/14: 26.5% 11/15: 24.38% 12/5: 26%; 05/09/2018: 29% 2/6: 23.75% 526: 30% 6/17: 26.8%  7/2: 28%    Time  8    Period  Weeks    Status  Partially Met    Target Date  01/20/19      PT LONG TERM GOAL #5   Title  Patient will negotiate one step ~4 inches to enter house independently to increase functional independence in natural environment    Baseline  /20: unable to do 9/26: patient too fatigued/not feeling well to perform 10/14: unable to perform at this time 11/15: unable to perform at this time; 12/5: able to lift 1 inch; 05/09/2018: unable  to clear 4" step, able to lift foot ~ 1in 5/26: has been unable to perform 6/17: able to lift foot 1.25 inch    Time  8    Period  Weeks    Status  On-going    Target Date  01/20/19      PT LONG TERM GOAL #7   Title  Patient will stand 5 minutes to increase standing duration to allow patient to return to washing dishes and clothes.     Baseline  12/5: 56 seconds; 05/09/2018: 1 min 18 sec 2/6: 2 minutes last session 5/26: 1 minute 36 seconds limited by breathing 6/17: terminated due to abdomen pain  7/20 terminated due to overheating    Time  8    Period  Weeks    Status  Partially Met    Target Date  01/20/19      PT LONG TERM GOAL #8   Title  Patient will negotiate 30 ft of changing surfaces  outside such as pavement and parking lot to allow patient to walk into church.    Baseline  12/5: does not walk outside 2/24: unable to test due to rain 5/26: unable since COVID 6/17: raining 7/20 too hot to test    Time  8    Period  Weeks    Status  On-going    Target Date  01/20/19            Plan - 12/23/18 1224    Clinical Impression Statement  Patient is progressing with functional stability in standing however continues to be challenged with prolonged standing due to fatigue. She was able to progress to modified tandem stance with SUE support and horizontal head turns without LOB. The patient will continue to benefit from skilled therapeutic intervention to address deficits in strength, mobility, balance, and function for improved overall QOL and safety    Rehab Potential  Good    PT Frequency  2x / week    PT Duration  8 weeks    PT Treatment/Interventions  ADLs/Self Care Home Management;Aquatic Therapy;Biofeedback;Cryotherapy;Electrical Stimulation;Iontophoresis 66m/ml Dexamethasone;Traction;Moist Heat;Ultrasound;DME Instruction;Gait training;Stair training;Functional mobility training;Therapeutic activities;Therapeutic exercise;Balance training;Neuromuscular re-education;Patient/family education;Orthotic Fit/Training;Wheelchair mobility training;Manual techniques;Compression bandaging;Passive range of motion;Dry needling;Energy conservation;Taping;Splinting    PT Next Visit Plan  strength/balance    PT Home Exercise Plan  to initiate next session (please incorporate hip flexor and adductor stretches in HEP if possible)    Consulted and Agree with Plan of Care  Patient       Patient will benefit from skilled therapeutic intervention in order to improve the following deficits and impairments:  Abnormal gait, Decreased activity tolerance, Decreased balance, Decreased coordination, Decreased endurance, Decreased knowledge of use of DME, Decreased mobility, Decreased range of motion,  Decreased safety awareness, Decreased strength, Difficulty walking, Impaired perceived functional ability, Impaired sensation, Impaired tone, Impaired UE functional use, Improper body mechanics, Postural dysfunction  Visit Diagnosis: 1. Muscle weakness (generalized)   2. Other abnormalities of gait and mobility   3. Unsteadiness on feet        Problem List Patient Active Problem List   Diagnosis Date Noted  . Abscess of female pelvis   . SV[place]cardia) [place]   . Radial styloid tenosynovitis 03/12/2018  . Wheelchair confinement 02/27/2018  . Localized osteoporosis with current pathological fracture with routine healing 01/19/2017  . Wrist fracture 01/16/2017  . Sprain of ankle 03/23/2016  . Closed fracture of lateral malleolus 03/16/2016  . Health care maintenance 01/24/2016  . Blood pressure  elevated without history of HTN 10/25/2015  . Essential hypertension 10/25/2015  . Multiple sclerosis ([place]) 10/02/2015  . Chronic left shoulder pain 07/19/2015  . Multiple sclerosis exacerbation ([place]) 07/14/2015  . MS (multiple sclerosis) ([place]) 11/26/2014  . Increased body mass index 11/26/2014  . HPV test positive 11/26/2014  . Status post laparoscopic supracervical hysterectomy 11/26/2014  . Galactorrhea 11/26/2014  . Back ache 05/21/2014  . Adiposity 05/21/2014  . Disordered sleep 05/21/2014  . Muscle spasticity 05/21/2014  . Spasticity 05/21/2014  . Calculus of kidney 12/09/2013  . Renal colic 87/68/1157  . Hypercholesteremia 08/19/2013  . Hereditary and idiopathic neuropathy 08/19/2013  . Hypercholesterolemia without hypertriglyceridemia 08/19/2013  . Bladder infection, chronic 07/25/2012  . Disorder of bladder function 07/25/2012  . Incomplete bladder emptying 07/25/2012  . Microscopic hematuria 07/25/2012  . Right upper quadrant pain 07/25/2012   Janna Arch, PT, DPT   12/23/2018, 12:25 PM  [place] MAIN [facility]  SERVICES [address] [place], [place], [address] Phone: [phone]   Fax:  [phone]  Name: Lisa Williams MRN: [id number] Date of Birth: 02-27-1961

## 2018-12-25 ENCOUNTER — Other Ambulatory Visit: Payer: Self-pay

## 2018-12-25 ENCOUNTER — Ambulatory Visit: Payer: Medicare HMO

## 2018-12-25 DIAGNOSIS — M6281 Muscle weakness (generalized): Secondary | ICD-10-CM | POA: Diagnosis not present

## 2018-12-25 DIAGNOSIS — R2681 Unsteadiness on feet: Secondary | ICD-10-CM

## 2018-12-25 DIAGNOSIS — R2689 Other abnormalities of gait and mobility: Secondary | ICD-10-CM

## 2018-12-25 NOTE — Therapy (Signed)
[place] MAIN [facility] SERVICES [address] [place], [place], [address] Phone: [phone]   Fax:  [phone]  Physical Therapy Treatment  Patient Details  Name: Lisa Williams MRN: [id number] Date of Birth: [birth year] Referring Provider (PT): Gurney Maxin, MD   Encounter Date: 12/25/2018  PT End of Session - 12/25/18 1255    Visit Number  108    Number of Visits  115    Date for PT Re-Evaluation  01/20/19    Authorization Type  8/10 PN start 7/22    PT Start Time  1015    PT Stop Time  1058    PT Time Calculation (min)  43 min    Equipment Utilized During Treatment  Gait belt    Activity Tolerance  Patient tolerated treatment well;Other (comment)   heat intolerance   Behavior During Therapy  WFL for tasks assessed/performed       Past Medical History:  Diagnosis Date  . Abdominal pain, right upper quadrant   . Back pain   . Calculus of kidney 12/09/2013  . Chronic back pain    unspecified  . Chronic left shoulder pain 07/19/2015  . Functional disorder of bladder    other  . Galactorrhea 11/26/2014   Chronic   . Hereditary and idiopathic neuropathy 08/19/2013  . HPV test positive   . Hypercholesteremia 08/19/2013  . Incomplete bladder emptying   . Microscopic hematuria   . MS (multiple sclerosis) ([place])   . Muscle spasticity 05/21/2014  . Nonspecific findings on examination of urine    other  . Osteopenia   . Status post laparoscopic supracervical hysterectomy 11/26/2014  . Tobacco user 11/26/2014  . Wrist fracture     Past Surgical History:  Procedure Laterality Date  . bilateral tubal ligation  1996  . BREAST CYST EXCISION Left 2002  . KNEE SURGERY     right  . LAPAROSCOPIC SUPRACERVICAL HYSTERECTOMY  08/05/2013  . ORIF WRIST FRACTURE Left 01/17/2017   Procedure: OPEN REDUCTION INTERNAL FIXATION (ORIF) WRIST FRACTURE;  Surgeon: Lovell Sheehan, MD;  Location: [facility] ORS;  Service: Orthopedics;  Laterality: Left;   . TUBAL LIGATION Bilateral   . VAGINAL HYSTERECTOMY  03/2006    There were no vitals filed for this visit.  Subjective Assessment - 12/25/18 1254    Subjective  Patient reports compliance with HEP, no falls or LOB since last session. Is a little warm this morning so brought her ice collar.    Pertinent History  Pt is a 58 y/o F who presents with BLE weakness and difficulty ambulating due to h/o MS that was diagnosed in 1995. Pt denies any pain associated. Pt was admitted to the hospital in November 2018 due to MS exacerbation. Nov-Dec to HHPT-->[facility] until end of Jan. After rehab the pt was able to ambulate up to 100 ft with the RW. Pt has Bil AFO, her therapist at [facility] thinks she needs a new AFO as her feet still drag when she ambulates, pt reports HHPT mentioned a spring AFO. Pt has custom AFOs that were originally made 4 years ago. Goals: improve balance, to be able to get into the shower with her tub bench without assist, to be able to get up 2 steps at sister's home with railings on both sides but too far apart to reach both sides, to be able to drive adapted car. Pt is staying at other sister's house with one step to get inside and currently picking up  L leg with UEs and then stepping in. Pt steps down leading with LLE. Pt wants to work on walking side to side as this is challenging and she had not yet progressed to this at rehab. Pt able to get in/out the car with assist only to bring BLEs into the car. Hoping to drive by June or July with adapted car (already has one). Pt needs assist bringing LEs into and out of hospital bed. Pt able to perform sit>stand without assist from [facility] with a pillow on the seat for a boost. Pt denies any falls in the past 6 months. Pt has been increasing her time alone at home up to a few hours at a time to become more independent. Pt has both manual and power WC. Uses power WC at home, uses manual WC out in community. Pt able to self propel manual WC. Pt  needs assist putting pants around ankles but she can pull them up. Pt can put on her shoes but needs help getting her leg up to do so. Pt able to tie her shoelaces on her own. Pt has a tub/shower unit at home with a tub bench. No grab bars in the shower. Has hand held shower head. Pt currently taking a sponge bath. Pt has a regular height toilet with a BSC over top. Pt with h/o L wrist fx with no precautions, pt wore her soft brace to evaluation for comfort but reports her wrist has healed and is doing well following OT.     Currently in Pain?  No/denies        With RW Standing weight shift with RW: focus on gluteal and abdominal activation to activate LE's. x12 each LE Standing step 7x each leg, , cueing for upright posture with abdominal activation Improved step length each LE with improved foot clearance.  Ambulation 40 ft with dragging of L foot with fatigue. Initially able to perform with good foot clearance, weight shift and step length however as patient progressed and fatigued was limited shifting onto RLE and flexing L hip. Wheelchair follow with CGA.    Seated weighted ball (2000 gr) -Circles/upright with upright posture 10x clockwise, 10x counterclockwise -wood chops 10x each direction   2lb bar -row seated 15x with focus on scapular squeeze  -diagonal row for single scapular retraction/depression 12x each side   Seated hamstring stretch 60 seconds each side.      Seated rest breaks and use of ice pack utilized to keep body temperature at functional range to reduce MS exacerbation.     Pt educated throughout session about proper posture and technique with exercises. Improved exercise technique, movement at target joints, use of target muscles after min to mod verbal, visual, tactile cues.                   PT Education - 12/25/18 1255    Education provided  Yes    Education Details  exercise technique, stability , body mechanics    Person(s) Educated   Patient    Methods  Explanation;Demonstration;Tactile cues;Verbal cues    Comprehension  Returned demonstration;Verbalized understanding;Verbal cues required;Tactile cues required       PT Short Term Goals - 11/25/18 1249      PT SHORT TERM GOAL #1   Title  Patient will be compliant with HEP 4-5 days/week for improved carryover between sessions.     Baseline  HEP compliancy between sessions 6/17 compliant    Time  2  Period  Weeks    Status  Achieved    Target Date  10/15/18      PT SHORT TERM GOAL #2   Title  Patient will report no falls/LOB to indicate improved stability with mobility     Baseline  6/17: no falls, one episode of instability 7/20: no falls    Time  2    Period  Weeks    Status  Achieved    Target Date  11/06/18        PT Long Term Goals - 11/25/18 0001      PT LONG TERM GOAL #1   Title  Patient will perform TUG in 40 seconds or less with walker to improve safe negotiation of environment.     Baseline  5/26: 61 seconds 6/17: 60 seconds 7/20: 59 seconds    Time  8    Period  Weeks    Status  Partially Met    Target Date  01/20/19      PT LONG TERM GOAL #2   Title  Pt's 85mT will improve to at least 0.5 m/s for improved safety ambulating in home    Baseline  0.14 m/s; 5/15: .273m  7/10: .36 m/s  8/20: .42 m/s  9/26: 0.2660m10/14: .26 m/s improved hip and knee clearance 11/15: 0.83m34mmproved hip and knee clearance; 12/5: .29 m/s improved step length and forward moment: decreased foot drag. 05/09/2018: 0.21 m/s 2/6: .237 m/s 5/26: .23 m/s 6/17: .24 m/s  7/20: 0.26 m/s    Time  8    Period  Weeks    Status  Partially Met    Target Date  01/20/19      PT LONG TERM GOAL #3   Title  Pt's Bil hip F strength will improve to at least 2+/5 BLEs for improved gait mechanics and safety    Baseline  L: 2/5, R: 1/5 7/10: 2/5 bilaterally  8/20: 2/5 bilat 9/26: 2/5 bilat 10/14: 2/5 bilaterally ,improved hip flexor muscle activation 2/5: improved hip flexor and quad  activation: 12/5: 2/5 ; 05/09/2018: 2/5 2/6: 3/5 knee, 2 to 2+/5 hip 5/26: 2-/5 hips 3/5 knees 6/17: 2-/5 hips 3/5 knees    Time  8    Period  Weeks    Status  Partially Met    Target Date  01/20/19      PT LONG TERM GOAL #4   Title  Pt's ABC Scale will improve to at least 40% to demonstrate improved balance    Baseline  28.13% 5/15; 24.4% 7/10: 23.4% 8/20: 22. 2% 9/26: 25.3% 10/14: 26.5% 11/15: 24.38% 12/5: 26%; 05/09/2018: 29% 2/6: 23.75% 526: 30% 6/17: 26.8%  7/2: 28%    Time  8    Period  Weeks    Status  Partially Met    Target Date  01/20/19      PT LONG TERM GOAL #5   Title  Patient will negotiate one step ~4 inches to enter house independently to increase functional independence in natural environment    Baseline  /20: unable to do 9/26: patient too fatigued/not feeling well to perform 10/14: unable to perform at this time 11/15: unable to perform at this time; 12/5: able to lift 1 inch; 05/09/2018: unable to clear 4" step, able to lift foot ~ 1in 5/26: has been unable to perform 6/17: able to lift foot 1.25 inch    Time  8    Period  Weeks    Status  On-going  Target Date  01/20/19      PT LONG TERM GOAL #7   Title  Patient will stand 5 minutes to increase standing duration to allow patient to return to washing dishes and clothes.     Baseline  12/5: 56 seconds; 05/09/2018: 1 min 18 sec 2/6: 2 minutes last session 5/26: 1 minute 36 seconds limited by breathing 6/17: terminated due to abdomen pain  7/20 terminated due to overheating    Time  8    Period  Weeks    Status  Partially Met    Target Date  01/20/19      PT LONG TERM GOAL #8   Title  Patient will negotiate 30 ft of changing surfaces outside such as pavement and parking lot to allow patient to walk into church.    Baseline  12/5: does not walk outside 2/24: unable to test due to rain 5/26: unable since COVID 6/17: raining 7/20 too hot to test    Time  8    Period  Weeks    Status  On-going    Target Date  01/20/19             Plan - 12/25/18 1256    Clinical Impression Statement  Patient is progressing with functional strength and mobility with decreased reliance upon UE's in standing. She demonstrated ability to ambulate after standing interventions; which normally is too challenging for her to accomplish due to fatigue . The patient will continue to benefit from skilled therapeutic intervention to address deficits in strength, mobility, balance, and function for improved overall QOL and safety    Rehab Potential  Good    PT Frequency  2x / week    PT Duration  8 weeks    PT Treatment/Interventions  ADLs/Self Care Home Management;Aquatic Therapy;Biofeedback;Cryotherapy;Electrical Stimulation;Iontophoresis 4m/ml Dexamethasone;Traction;Moist Heat;Ultrasound;DME Instruction;Gait training;Stair training;Functional mobility training;Therapeutic activities;Therapeutic exercise;Balance training;Neuromuscular re-education;Patient/family education;Orthotic Fit/Training;Wheelchair mobility training;Manual techniques;Compression bandaging;Passive range of motion;Dry needling;Energy conservation;Taping;Splinting    PT Next Visit Plan  strength/balance    PT Home Exercise Plan  to initiate next session (please incorporate hip flexor and adductor stretches in HEP if possible)    Consulted and Agree with Plan of Care  Patient       Patient will benefit from skilled therapeutic intervention in order to improve the following deficits and impairments:  Abnormal gait, Decreased activity tolerance, Decreased balance, Decreased coordination, Decreased endurance, Decreased knowledge of use of DME, Decreased mobility, Decreased range of motion, Decreased safety awareness, Decreased strength, Difficulty walking, Impaired perceived functional ability, Impaired sensation, Impaired tone, Impaired UE functional use, Improper body mechanics, Postural dysfunction  Visit Diagnosis: 1. Muscle weakness (generalized)   2. Other  abnormalities of gait and mobility   3. Unsteadiness on feet        Problem List Patient Active Problem List   Diagnosis Date Noted  . Abscess of female pelvis   . SVT (supraventricular tachycardia) ([place]   . Radial styloid tenosynovitis 03/12/2018  . Wheelchair confinement 02/27/2018  . Localized osteoporosis with current pathological fracture with routine healing 01/19/2017  . Wrist fracture 01/16/2017  . Sprain of ankle 03/23/2016  . Closed fracture of lateral malleolus 03/16/2016  . Health care maintenance 01/24/2016  . Blood pressure elevated without history of HTN 10/25/2015  . Essential hypertension 10/25/2015  . Multiple sclerosis ([place] 10/02/2015  . Chronic left shoulder pain 07/19/2015  . [place] ([place] 07/14/2015  . MS (multiple sclerosis) ([place] 11/26/2014  . Increased body mass index 11/26/2014  .  HPV test positive 11/26/2014  . Status post laparoscopic supracervical hysterectomy 11/26/2014  . Galactorrhea 11/26/2014  . Back ache 05/21/2014  . Adiposity 05/21/2014  . Disordered sleep 05/21/2014  . Muscle spasticity 05/21/2014  . Spasticity 05/21/2014  . Calculus of kidney 12/09/2013  . Renal colic 73/41/9379  . Hypercholesteremia 08/19/2013  . Hereditary and idiopathic neuropathy 08/19/2013  . Hypercholesterolemia without hypertriglyceridemia 08/19/2013  . Bladder infection, chronic 07/25/2012  . Disorder of bladder function 07/25/2012  . Incomplete bladder emptying 07/25/2012  . Microscopic hematuria 07/25/2012  . Right upper quadrant pain 07/25/2012   Janna Arch, PT, DPT   12/25/2018, 12:57 PM  [place] MAIN [facility] SERVICES [address] [place], [place], [address] Phone: [phone]   Fax:  [phone]  Name: Yevette Knust Connell MRN: [id number] Date of Birth: [birth year]

## 2018-12-30 ENCOUNTER — Other Ambulatory Visit: Payer: Self-pay

## 2018-12-30 ENCOUNTER — Ambulatory Visit: Payer: Medicare HMO

## 2018-12-30 DIAGNOSIS — M6281 Muscle weakness (generalized): Secondary | ICD-10-CM | POA: Diagnosis not present

## 2018-12-30 DIAGNOSIS — R2681 Unsteadiness on feet: Secondary | ICD-10-CM

## 2018-12-30 DIAGNOSIS — R2689 Other abnormalities of gait and mobility: Secondary | ICD-10-CM

## 2018-12-30 NOTE — Therapy (Signed)
[place] MAIN [facility] SERVICES [address] [place], [place], [address] Phone: [phone]   Fax:  [phone]  Physical Therapy Treatment  Patient Details  Name: Lisa Williams MRN: [id number] Date of Birth: 1961-03-29 Referring Provider (PT): Gurney Maxin, MD   Encounter Date: 12/30/2018  PT End of Session - 12/30/18 1235    Visit Number  109    Number of Visits  115    Date for PT Re-Evaluation  01/20/19    Authorization Type  9/10 PN start 7/22    PT Start Time  1015    PT Stop Time  1057    PT Time Calculation (min)  42 min    Equipment Utilized During Treatment  Gait belt    Activity Tolerance  Patient tolerated treatment well;Other (comment)   heat intolerance   Behavior During Therapy  WFL for tasks assessed/performed       Past Medical History:  Diagnosis Date  . Abdominal pain, right upper quadrant   . Back pain   . Calculus of kidney 12/09/2013  . Chronic back pain    unspecified  . Chronic left shoulder pain 07/19/2015  . Functional disorder of bladder    other  . Galactorrhea 11/26/2014   Chronic   . Hereditary and idiopathic neuropathy 08/19/2013  . HPV test positive   . Hypercholesteremia 08/19/2013  . Incomplete bladder emptying   . Microscopic hematuria   . MS (multiple sclerosis) ([place])   . Muscle spasticity 05/21/2014  . Nonspecific findings on examination of urine    other  . Osteopenia   . Status post laparoscopic supracervical hysterectomy 11/26/2014  . Tobacco user 11/26/2014  . Wrist fracture     Past Surgical History:  Procedure Laterality Date  . bilateral tubal ligation  1996  . BREAST CYST EXCISION Left 2002  . KNEE SURGERY     right  . LAPAROSCOPIC SUPRACERVICAL HYSTERECTOMY  08/05/2013  . ORIF WRIST FRACTURE Left 01/17/2017   Procedure: OPEN REDUCTION INTERNAL FIXATION (ORIF) WRIST FRACTURE;  Surgeon: Lovell Sheehan, MD;  Location: [facility] ORS;  Service: Orthopedics;  Laterality: Left;   . TUBAL LIGATION Bilateral   . VAGINAL HYSTERECTOMY  03/2006    There were no vitals filed for this visit.  Subjective Assessment - 12/30/18 1017    Subjective  Patient reports being compliant with HEP. No falls or LOB since last session, cooked a little over the weekend.    Pertinent History  Pt is a 58 y/o F who presents with BLE weakness and difficulty ambulating due to h/o MS that was diagnosed in 1995. Pt denies any pain associated. Pt was admitted to the hospital in November 2018 due to MS exacerbation. Nov-Dec to HHPT-->[facility] until end of Jan. After rehab the pt was able to ambulate up to 100 ft with the RW. Pt has Bil AFO, her therapist at [facility] thinks she needs a new AFO as her feet still drag when she ambulates, pt reports HHPT mentioned a spring AFO. Pt has custom AFOs that were originally made 4 years ago. Goals: improve balance, to be able to get into the shower with her tub bench without assist, to be able to get up 2 steps at sister's home with railings on both sides but too far apart to reach both sides, to be able to drive adapted car. Pt is staying at other sister's house with one step to get inside and currently picking up L leg with UEs  and then stepping in. Pt steps down leading with LLE. Pt wants to work on walking side to side as this is challenging and she had not yet progressed to this at rehab. Pt able to get in/out the car with assist only to bring BLEs into the car. Hoping to drive by June or July with adapted car (already has one). Pt needs assist bringing LEs into and out of hospital bed. Pt able to perform sit>stand without assist from [facility] with a pillow on the seat for a boost. Pt denies any falls in the past 6 months. Pt has been increasing her time alone at home up to a few hours at a time to become more independent. Pt has both manual and power WC. Uses power WC at home, uses manual WC out in community. Pt able to self propel manual WC. Pt needs assist putting  pants around ankles but she can pull them up. Pt can put on her shoes but needs help getting her leg up to do so. Pt able to tie her shoelaces on her own. Pt has a tub/shower unit at home with a tub bench. No grab bars in the shower. Has hand held shower head. Pt currently taking a sponge bath. Pt has a regular height toilet with a BSC over top. Pt with h/o L wrist fx with no precautions, pt wore her soft brace to evaluation for comfort but reports her wrist has healed and is doing well following OT.     Currently in Pain?  No/denies         Treatment:  in // bars  Standing weight shifts; cues for hip shift ; BUE , cueing for hip movement rather than shoulder for optimal carryover for step negotiation/ambulation with foot clearance.    in // bars:step over raised theraband 1.5 5x each side.able to clear 5x each side. Challenging for LLE , seated rest break between each LE.   In // bars: step back 5x each LE, BUE support ; very fatiguing to patient. More challenging for LLE coordination.   Sit to stand technique with UE support, decreased reliance upon UE's with improved eccentric control, no "plopping". Multiple performances throughout session    Seated:   3lb dumbells  -cross body jabs 12x each side to body  - uppercuts 12x each side  -straight arm raise 12x -chest press with dumbbells together 12x   RTB abduction 12x   PVC pipe forward backward overpressure for back and shoulder muscle tissue lengthening with row at end x 10   Pt educated throughout session about proper posture and technique with exercises. Improved exercise technique, movement at target joints, use of target muscles after minverbal, visual, tactile cues  Seated rest breaks and use of ice pack utilized to keep body temperature at functional range to reduce MS exacerbation.                    PT Education - 12/30/18 1036    Education provided  Yes    Education Details  exercise technique,  body mechanics    Person(s) Educated  Patient    Methods  Explanation;Demonstration;Tactile cues;Verbal cues    Comprehension  Verbalized understanding;Returned demonstration;Verbal cues required;Tactile cues required       PT Short Term Goals - 11/25/18 1249      PT SHORT TERM GOAL #1   Title  Patient will be compliant with HEP 4-5 days/week for improved carryover between sessions.     Baseline  HEP compliancy between sessions 6/17 compliant    Time  2    Period  Weeks    Status  Achieved    Target Date  10/15/18      PT SHORT TERM GOAL #2   Title  Patient will report no falls/LOB to indicate improved stability with mobility     Baseline  6/17: no falls, one episode of instability 7/20: no falls    Time  2    Period  Weeks    Status  Achieved    Target Date  11/06/18        PT Long Term Goals - 11/25/18 0001      PT LONG TERM GOAL #1   Title  Patient will perform TUG in 40 seconds or less with walker to improve safe negotiation of environment.     Baseline  5/26: 61 seconds 6/17: 60 seconds 7/20: 59 seconds    Time  8    Period  Weeks    Status  Partially Met    Target Date  01/20/19      PT LONG TERM GOAL #2   Title  Pt's 78mT will improve to at least 0.5 m/s for improved safety ambulating in home    Baseline  0.14 m/s; 5/15: .234m  7/10: .36 m/s  8/20: .42 m/s  9/26: 0.2669m10/14: .26 m/s improved hip and knee clearance 11/15: 0.40m21mmproved hip and knee clearance; 12/5: .29 m/s improved step length and forward moment: decreased foot drag. 05/09/2018: 0.21 m/s 2/6: .237 m/s 5/26: .23 m/s 6/17: .24 m/s  7/20: 0.26 m/s    Time  8    Period  Weeks    Status  Partially Met    Target Date  01/20/19      PT LONG TERM GOAL #3   Title  Pt's Bil hip F strength will improve to at least 2+/5 BLEs for improved gait mechanics and safety    Baseline  L: 2/5, R: 1/5 7/10: 2/5 bilaterally  8/20: 2/5 bilat 9/26: 2/5 bilat 10/14: 2/5 bilaterally ,improved hip flexor muscle  activation 2/5: improved hip flexor and quad activation: 12/5: 2/5 ; 05/09/2018: 2/5 2/6: 3/5 knee, 2 to 2+/5 hip 5/26: 2-/5 hips 3/5 knees 6/17: 2-/5 hips 3/5 knees    Time  8    Period  Weeks    Status  Partially Met    Target Date  01/20/19      PT LONG TERM GOAL #4   Title  Pt's ABC Scale will improve to at least 40% to demonstrate improved balance    Baseline  28.13% 5/15; 24.4% 7/10: 23.4% 8/20: 22. 2% 9/26: 25.3% 10/14: 26.5% 11/15: 24.38% 12/5: 26%; 05/09/2018: 29% 2/6: 23.75% 526: 30% 6/17: 26.8%  7/2: 28%    Time  8    Period  Weeks    Status  Partially Met    Target Date  01/20/19      PT LONG TERM GOAL #5   Title  Patient will negotiate one step ~4 inches to enter house independently to increase functional independence in natural environment    Baseline  /20: unable to do 9/26: patient too fatigued/not feeling well to perform 10/14: unable to perform at this time 11/15: unable to perform at this time; 12/5: able to lift 1 inch; 05/09/2018: unable to clear 4" step, able to lift foot ~ 1in 5/26: has been unable to perform 6/17: able to lift foot 1.25 inch    Time  8    Period  Weeks    Status  On-going    Target Date  01/20/19      PT LONG TERM GOAL #7   Title  Patient will stand 5 minutes to increase standing duration to allow patient to return to washing dishes and clothes.     Baseline  12/5: 56 seconds; 05/09/2018: 1 min 18 sec 2/6: 2 minutes last session 5/26: 1 minute 36 seconds limited by breathing 6/17: terminated due to abdomen pain  7/20 terminated due to overheating    Time  8    Period  Weeks    Status  Partially Met    Target Date  01/20/19      PT LONG TERM GOAL #8   Title  Patient will negotiate 30 ft of changing surfaces outside such as pavement and parking lot to allow patient to walk into church.    Baseline  12/5: does not walk outside 2/24: unable to test due to rain 5/26: unable since COVID 6/17: raining 7/20 too hot to test    Time  8    Period  Weeks     Status  On-going    Target Date  01/20/19            Plan - 12/30/18 1240    Clinical Impression Statement  Patient performed standing hip extension step back for first time this session indicating improved mobility and standing negotiation.  Patient continues to progress with functional mobility with decreased episodes of LOB and fatigue. LLE continues to be more challenged with interventions than right at this time. The patient will continue to benefit from skilled therapeutic intervention to address deficits in strength, mobility, balance, and function for improved overall QOL and safety    Rehab Potential  Good    PT Frequency  2x / week    PT Duration  8 weeks    PT Treatment/Interventions  ADLs/Self Care Home Management;Aquatic Therapy;Biofeedback;Cryotherapy;Electrical Stimulation;Iontophoresis 10m/ml Dexamethasone;Traction;Moist Heat;Ultrasound;DME Instruction;Gait training;Stair training;Functional mobility training;Therapeutic activities;Therapeutic exercise;Balance training;Neuromuscular re-education;Patient/family education;Orthotic Fit/Training;Wheelchair mobility training;Manual techniques;Compression bandaging;Passive range of motion;Dry needling;Energy conservation;Taping;Splinting    PT Next Visit Plan  strength/balance    PT Home Exercise Plan  to initiate next session (please incorporate hip flexor and adductor stretches in HEP if possible)    Consulted and Agree with Plan of Care  Patient       Patient will benefit from skilled therapeutic intervention in order to improve the following deficits and impairments:  Abnormal gait, Decreased activity tolerance, Decreased balance, Decreased coordination, Decreased endurance, Decreased knowledge of use of DME, Decreased mobility, Decreased range of motion, Decreased safety awareness, Decreased strength, Difficulty walking, Impaired perceived functional ability, Impaired sensation, Impaired tone, Impaired UE functional use, Improper  body mechanics, Postural dysfunction  Visit Diagnosis: Muscle weakness (generalized)  Other abnormalities of gait and mobility  Unsteadiness on feet     Problem List Patient Active Problem List   Diagnosis Date Noted  . Abscess of female pelvis   . [place]   . Radial styloid tenosynovitis 03/12/2018  . Wheelchair confinement 02/27/2018  . Localized osteoporosis with current pathological fracture with routine healing 01/19/2017  . Wrist fracture 01/16/2017  . Sprain of ankle 03/23/2016  . Closed fracture of lateral malleolus 03/16/2016  . Health care maintenance 01/24/2016  . Blood pressure elevated without history of HTN 10/25/2015  . Essential hypertension 10/25/2015  . Multiple sclerosis ([place] 10/02/2015  . Chronic left shoulder pain 07/19/2015  . Multiple  sclerosis exacerbation ([place]) 07/14/2015  . MS (multiple sclerosis) ([place]) 11/26/2014  . Increased body mass index 11/26/2014  . HPV test positive 11/26/2014  . Status post laparoscopic supracervical hysterectomy 11/26/2014  . Galactorrhea 11/26/2014  . Back ache 05/21/2014  . Adiposity 05/21/2014  . Disordered sleep 05/21/2014  . Muscle spasticity 05/21/2014  . Spasticity 05/21/2014  . Calculus of kidney 12/09/2013  . Renal colic 42/76/7011  . Hypercholesteremia 08/19/2013  . Hereditary and idiopathic neuropathy 08/19/2013  . Hypercholesterolemia without hypertriglyceridemia 08/19/2013  . Bladder infection, chronic 07/25/2012  . Disorder of bladder function 07/25/2012  . Incomplete bladder emptying 07/25/2012  . Microscopic hematuria 07/25/2012  . Right upper quadrant pain 07/25/2012   Janna Arch, PT, DPT   12/30/2018, 12:41 PM  [place] MAIN [facility] SERVICES [address] [place], [place], [address] Phone: [phone]   Fax:  [phone]  Name: Lisa Williams MRN: [id number] Date of Birth: July 05, 1960

## 2019-01-01 ENCOUNTER — Other Ambulatory Visit: Payer: Self-pay

## 2019-01-01 ENCOUNTER — Ambulatory Visit: Payer: Medicare HMO

## 2019-01-01 DIAGNOSIS — M6281 Muscle weakness (generalized): Secondary | ICD-10-CM | POA: Diagnosis not present

## 2019-01-01 DIAGNOSIS — R2681 Unsteadiness on feet: Secondary | ICD-10-CM

## 2019-01-01 DIAGNOSIS — R2689 Other abnormalities of gait and mobility: Secondary | ICD-10-CM

## 2019-01-01 NOTE — Therapy (Signed)
[place] MAIN [facility] SERVICES [address] [place], [place], [address] Phone: [phone]   Fax:  [phone]  Physical Therapy Treatment Physical Therapy Progress Note   Dates of reporting period  11/27/18   to   01/01/19   Patient Details  Name: Lisa Williams MRN: [id number] Date of Birth: 09/01/1960 Referring Provider (PT): Gurney Maxin, MD   Encounter Date: 01/01/2019  PT End of Session - 01/01/19 1125    Visit Number  110    Number of Visits  115    Date for PT Re-Evaluation  01/20/19    Authorization Type  10/10 next session 1/10 with PN 01/01/19    PT Start Time  1015    PT Stop Time  1101    PT Time Calculation (min)  46 min    Equipment Utilized During Treatment  Gait belt    Activity Tolerance  Patient tolerated treatment well;Other (comment)   heat intolerance   Behavior During Therapy  WFL for tasks assessed/performed       Past Medical History:  Diagnosis Date  . Abdominal pain, right upper quadrant   . Back pain   . Calculus of kidney 12/09/2013  . Chronic back pain    unspecified  . Chronic left shoulder pain 07/19/2015  . Functional disorder of bladder    other  . Galactorrhea 11/26/2014   Chronic   . Hereditary and idiopathic neuropathy 08/19/2013  . HPV test positive   . Hypercholesteremia 08/19/2013  . Incomplete bladder emptying   . Microscopic hematuria   . MS (multiple sclerosis) ([place])   . Muscle spasticity 05/21/2014  . Nonspecific findings on examination of urine    other  . Osteopenia   . Status post laparoscopic supracervical hysterectomy 11/26/2014  . Tobacco user 11/26/2014  . Wrist fracture     Past Surgical History:  Procedure Laterality Date  . bilateral tubal ligation  1996  . BREAST CYST EXCISION Left 2002  . KNEE SURGERY     right  . LAPAROSCOPIC SUPRACERVICAL HYSTERECTOMY  08/05/2013  . ORIF WRIST FRACTURE Left 01/17/2017   Procedure: OPEN REDUCTION INTERNAL FIXATION (ORIF)  WRIST FRACTURE;  Surgeon: Lovell Sheehan, MD;  Location: [facility] ORS;  Service: Orthopedics;  Laterality: Left;  . TUBAL LIGATION Bilateral   . VAGINAL HYSTERECTOMY  03/2006    There were no vitals filed for this visit.  Subjective Assessment - 01/01/19 1118    Subjective  Patient reports being slightly fatigued due to heat/temperature outside. Has been compliant with HEP and has experienced no falls or LOB.    Pertinent History  Pt is a 58 y/o F who presents with BLE weakness and difficulty ambulating due to h/o MS that was diagnosed in 1995. Pt denies any pain associated. Pt was admitted to the hospital in November 2018 due to MS exacerbation. Nov-Dec to HHPT-->[facility] until end of Jan. After rehab the pt was able to ambulate up to 100 ft with the RW. Pt has Bil AFO, her therapist at [facility] thinks she needs a new AFO as her feet still drag when she ambulates, pt reports HHPT mentioned a spring AFO. Pt has custom AFOs that were originally made 4 years ago. Goals: improve balance, to be able to get into the shower with her tub bench without assist, to be able to get up 2 steps at sister's home with railings on both sides but too far apart to reach both sides, to be able to  drive adapted car. Pt is staying at other sister's house with one step to get inside and currently picking up L leg with UEs and then stepping in. Pt steps down leading with LLE. Pt wants to work on walking side to side as this is challenging and she had not yet progressed to this at rehab. Pt able to get in/out the car with assist only to bring BLEs into the car. Hoping to drive by June or July with adapted car (already has one). Pt needs assist bringing LEs into and out of hospital bed. Pt able to perform sit>stand without assist from [facility] with a pillow on the seat for a boost. Pt denies any falls in the past 6 months. Pt has been increasing her time alone at home up to a few hours at a time to become more independent. Pt has both  manual and power WC. Uses power WC at home, uses manual WC out in community. Pt able to self propel manual WC. Pt needs assist putting pants around ankles but she can pull them up. Pt can put on her shoes but needs help getting her leg up to do so. Pt able to tie her shoelaces on her own. Pt has a tub/shower unit at home with a tub bench. No grab bars in the shower. Has hand held shower head. Pt currently taking a sponge bath. Pt has a regular height toilet with a BSC over top. Pt with h/o L wrist fx with no precautions, pt wore her soft brace to evaluation for comfort but reports her wrist has healed and is doing well following OT.     Currently in Pain?  No/denies         Progress note GOALS TUG: 55 seconds 10 MWT: 0.26 m/s improved gait mechanics with no scuffing of feet.  ABC: 30.3% Negotiate one step: able to step up on 1.5 inch step when leading with RLE.  5 min standing: 3 minutes standing at home 30 ft outside: too hot/ COVID  Treatment: Standing with RW: semi tandem stance of one foot forward, rocking df/pf for preswing/initial swing training x 8 each side, no rest break required  5lb weights: -seated lateral reach/trunk lateral flexion alternating sides x 10  2lb weights: -alternating straight arm raise seated; first set with opp hand resting on knee, second set with both arms suspended for core and trunk activation x30 seconds each side -in out abduction/adduction fly 30 seconds  Arms crossed upright posture no trunk support, hip flexor isometrics 10x each LE.   Patient's condition has the potential to improve in response to therapy. Maximum improvement is yet to be obtained. The anticipated improvement is attainable and reasonable in a generally predictable time.  Patient reports she is able to stand for longer and walk easier making ADLs such as dressing and bathing more independent and less of a "chore".                         PT Education - 01/01/19  1120    Education provided  Yes    Education Details  exercise technique, goals, body mechanics    Person(s) Educated  Patient    Methods  Explanation;Demonstration;Tactile cues;Verbal cues    Comprehension  Verbalized understanding;Returned demonstration;Verbal cues required;Tactile cues required       PT Short Term Goals - 11/25/18 1249      PT SHORT TERM GOAL #1   Title  Patient will be compliant  with HEP 4-5 days/week for improved carryover between sessions.     Baseline  HEP compliancy between sessions 6/17 compliant    Time  2    Period  Weeks    Status  Achieved    Target Date  10/15/18      PT SHORT TERM GOAL #2   Title  Patient will report no falls/LOB to indicate improved stability with mobility     Baseline  6/17: no falls, one episode of instability 7/20: no falls    Time  2    Period  Weeks    Status  Achieved    Target Date  11/06/18        PT Long Term Goals - 01/01/19 0001      PT LONG TERM GOAL #1   Title  Patient will perform TUG in 40 seconds or less with walker to improve safe negotiation of environment.     Baseline  5/26: 61 seconds 6/17: 60 seconds 7/20: 59 seconds 8/26:  55 seconds    Time  8    Period  Weeks    Status  Partially Met    Target Date  01/20/19      PT LONG TERM GOAL #2   Title  Pt's 79mT will improve to at least 0.5 m/s for improved safety ambulating in home    Baseline  0.14 m/s; 5/15: .290m  7/10: .36 m/s  8/20: .42 m/s  9/26: 0.2619m10/14: .26 m/s improved hip and knee clearance 11/15: 0.32m81mmproved hip and knee clearance; 12/5: .29 m/s improved step length and forward moment: decreased foot drag. 05/09/2018: 0.21 m/s 2/6: .237 m/s 5/26: .23 m/s 6/17: .24 m/s  7/20: 0.26 m/s 8/26: 0.26 m/s    Time  8    Period  Weeks    Status  Partially Met    Target Date  01/20/19      PT LONG TERM GOAL #3   Title  Pt's Bil hip F strength will improve to at least 2+/5 BLEs for improved gait mechanics and safety    Baseline  L: 2/5, R:  1/5 7/10: 2/5 bilaterally  8/20: 2/5 bilat 9/26: 2/5 bilat 10/14: 2/5 bilaterally ,improved hip flexor muscle activation 2/5: improved hip flexor and quad activation: 12/5: 2/5 ; 05/09/2018: 2/5 2/6: 3/5 knee, 2 to 2+/5 hip 5/26: 2-/5 hips 3/5 knees 6/17: 2-/5 hips 3/5 knees    Time  8    Period  Weeks    Status  Partially Met    Target Date  01/20/19      PT LONG TERM GOAL #4   Title  Pt's ABC Scale will improve to at least 40% to demonstrate improved balance    Baseline  28.13% 5/15; 24.4% 7/10: 23.4% 8/20: 22. 2% 9/26: 25.3% 10/14: 26.5% 11/15: 24.38% 12/5: 26%; 05/09/2018: 29% 2/6: 23.75% 526: 30% 6/17: 26.8%  7/2: 28% 8/26: 30.3%    Time  8    Period  Weeks    Status  Partially Met    Target Date  01/20/19      PT LONG TERM GOAL #5   Title  Patient will negotiate one step ~4 inches to enter house independently to increase functional independence in natural environment    Baseline  /20: unable to do 9/26: patient too fatigued/not feeling well to perform 10/14: unable to perform at this time 11/15: unable to perform at this time; 12/5: able to lift 1 inch; 05/09/2018: unable to clear 4" step,  able to lift foot ~ 1in 5/26: has been unable to perform 6/17: able to lift foot 1.25 inch 8/26: has been able to step up onto 1.5 inch step in previous sessions    Time  8    Period  Weeks    Status  Partially Met    Target Date  01/20/19      PT LONG TERM GOAL #6   Title  Patient will negotiate 30 ft of changing surfaces outside such as pavement and parking lot to allow patient to walk into church.    Baseline  12/5: does not walk outside 2/24: unable to test due to rain 5/26: unable since COVID 6/17: raining 7/20 too hot to test 8/26: unable to walk outside due to heat and COVID    Time  8    Period  Weeks    Status  Deferred    Target Date  01/20/19      PT LONG TERM GOAL #7   Title  Patient will stand 5 minutes to increase standing duration to allow patient to return to washing dishes and  clothes.     Baseline  12/5: 56 seconds; 05/09/2018: 1 min 18 sec 2/6: 2 minutes last session 5/26: 1 minute 36 seconds limited by breathing 6/17: terminated due to abdomen pain  7/20 terminated due to overheating 8/26: able to stand 3 minutes at home    Time  8    Period  Weeks    Status  Partially Met    Target Date  01/20/19            Plan - 01/01/19 1128    Clinical Impression Statement  Patient presents with same gait speed at 3 weeks ago despite heat outside and fatigue indicating improved gait mechanics and velocity. Patient performed TUG faster than previous scores with improved turning speed with no LOB. Patient is progressing with standing tolerance at this time which is making ADLs at home easier per patient report. Patient's condition has the potential to improve in response to therapy. Maximum improvement is yet to be obtained. The anticipated improvement is attainable and reasonable in a generally predictable time. The patient will continue to benefit from skilled therapeutic intervention to address deficits in strength, mobility, balance, and function for improved overall QOL and safety    Rehab Potential  Good    PT Frequency  2x / week    PT Duration  8 weeks    PT Treatment/Interventions  ADLs/Self Care Home Management;Aquatic Therapy;Biofeedback;Cryotherapy;Electrical Stimulation;Iontophoresis 28m/ml Dexamethasone;Traction;Moist Heat;Ultrasound;DME Instruction;Gait training;Stair training;Functional mobility training;Therapeutic activities;Therapeutic exercise;Balance training;Neuromuscular re-education;Patient/family education;Orthotic Fit/Training;Wheelchair mobility training;Manual techniques;Compression bandaging;Passive range of motion;Dry needling;Energy conservation;Taping;Splinting    PT Next Visit Plan  strength/balance    PT Home Exercise Plan  to initiate next session (please incorporate hip flexor and adductor stretches in HEP if possible)    Consulted and Agree  with Plan of Care  Patient       Patient will benefit from skilled therapeutic intervention in order to improve the following deficits and impairments:  Abnormal gait, Decreased activity tolerance, Decreased balance, Decreased coordination, Decreased endurance, Decreased knowledge of use of DME, Decreased mobility, Decreased range of motion, Decreased safety awareness, Decreased strength, Difficulty walking, Impaired perceived functional ability, Impaired sensation, Impaired tone, Impaired UE functional use, Improper body mechanics, Postural dysfunction  Visit Diagnosis: Muscle weakness (generalized)  Other abnormalities of gait and mobility  Unsteadiness on feet     Problem List Patient Active Problem List   Diagnosis  Date Noted  . Abscess of female pelvis   . SVT (supraventricular tachycardia) ([place])   . Radial styloid tenosynovitis 03/12/2018  . Wheelchair confinement 02/27/2018  . Localized osteoporosis with current pathological fracture with routine healing 01/19/2017  . Wrist fracture 01/16/2017  . Sprain of ankle 03/23/2016  . Closed fracture of lateral malleolus 03/16/2016  . Health care maintenance 01/24/2016  . Blood pressure elevated without history of HTN 10/25/2015  . Essential hypertension 10/25/2015  . Multiple sclerosis ([place]) 10/02/2015  . Chronic left shoulder pain 07/19/2015  . Multiple sclerosis exacerbation ([place]) 07/14/2015  . MS (multiple sclerosis) ([place]) 11/26/2014  . Increased body mass index 11/26/2014  . HPV test positive 11/26/2014  . Status post laparoscopic supracervical hysterectomy 11/26/2014  . Galactorrhea 11/26/2014  . Back ache 05/21/2014  . Adiposity 05/21/2014  . Disordered sleep 05/21/2014  . Muscle spasticity 05/21/2014  . Spasticity 05/21/2014  . Calculus of kidney 12/09/2013  . Renal colic 46/99/7802  . Hypercholesteremia 08/19/2013  . Hereditary and idiopathic neuropathy 08/19/2013  . Hypercholesterolemia without  hypertriglyceridemia 08/19/2013  . Bladder infection, chronic 07/25/2012  . Disorder of bladder function 07/25/2012  . Incomplete bladder emptying 07/25/2012  . Microscopic hematuria 07/25/2012  . Right upper quadrant pain 07/25/2012   Janna Arch, PT, DPT   01/01/2019, 11:34 AM  [place] MAIN [facility] SERVICES [address] [place], [place], [address] Phone: [phone]   Fax:  [phone]  Name: Lisa Williams MRN: [id number] Date of Birth: 1960/10/09

## 2019-01-06 ENCOUNTER — Ambulatory Visit: Payer: Medicare HMO

## 2019-01-06 ENCOUNTER — Other Ambulatory Visit: Payer: Self-pay

## 2019-01-06 DIAGNOSIS — M6281 Muscle weakness (generalized): Secondary | ICD-10-CM | POA: Diagnosis not present

## 2019-01-06 DIAGNOSIS — R2689 Other abnormalities of gait and mobility: Secondary | ICD-10-CM

## 2019-01-06 DIAGNOSIS — R2681 Unsteadiness on feet: Secondary | ICD-10-CM

## 2019-01-06 NOTE — Therapy (Signed)
[place] [facility]IN [facility] SERVICES [place], [place], [address] Phone: [phone]   Fax:  [phone]  Physical Therapy Treatment  Patient Details  Name: Lisa Williams MRN: [id number] Date of Birth: May 31, 1960 Referring Provider (PT): Gurney Maxin, MD   Encounter Date: 01/06/2019  PT End of Session - 01/06/19 1101    Visit Number  [id number]    Number of Visits  115    Date for PT Re-Evaluation  01/20/19    Authorization Type  1/10 with PN 01/01/19    PT Start Time  1015    PT Stop Time  1056    PT Time Calculation (min)  41 min    Equipment Utilized During Treatment  Gait belt    Activity Tolerance  Patient tolerated treatment well;Other (comment)   heat intolerance   Behavior During Therapy  WFL for tasks assessed/performed       Past Medical History:  Diagnosis Date  . Abdominal pain, right upper quadrant   . Back pain   . Calculus of kidney 12/09/2013  . Chronic back pain    unspecified  . Chronic left shoulder pain 07/19/2015  . Functional disorder of bladder    other  . Galactorrhea 11/26/2014   Chronic   . Hereditary and idiopathic neuropathy 08/19/2013  . HPV test positive   . Hypercholesteremia 08/19/2013  . Incomplete bladder emptying   . Microscopic hematuria   . MS (multiple sclerosis) ([place])   . Muscle spasticity 05/21/2014  . Nonspecific findings on examination of urine    other  . Osteopenia   . Status post laparoscopic supracervical hysterectomy 11/26/2014  . Tobacco user 11/26/2014  . Wrist fracture     Past Surgical History:  Procedure Laterality Date  . bilateral tubal ligation  1996  . BREAST CYST EXCISION Left 2002  . KNEE SURGERY     right  . LAPAROSCOPIC SUPRACERVICAL HYSTERECTOMY  08/05/2013  . ORIF WRIST FRACTURE Left 01/17/2017   Procedure: OPEN REDUCTION INTERNAL FIXATION (ORIF) WRIST FRACTURE;  Surgeon: Lovell Sheehan, MD;  Location: [facility] ORS;  Service: Orthopedics;  Laterality: Left;   . TUBAL LIGATION Bilateral   . VAGINAL HYSTERECTOMY  03/2006    There were no vitals filed for this visit.  Subjective Assessment - 01/06/19 1059    Subjective  Patient reports having a quiet weekend. Has been adding her new HEP exercises to her old to "mix it up". No falls or LOB since last session.    Pertinent History  Pt is a 58 y/o F who presents with BLE weakness and difficulty ambulating due to h/o MS that was diagnosed in 1995. Pt denies any pain associated. Pt was admitted to the hospital in November 2018 due to MS exacerbation. Nov-Dec to HHPT-->[facility] until end of Jan. After rehab the pt was able to ambulate up to 100 ft with the RW. Pt has Bil AFO, her therapist at [facility] thinks she needs a new AFO as her feet still drag when she ambulates, pt reports HHPT mentioned a spring AFO. Pt has custom AFOs that were originally made 4 years ago. Goals: improve balance, to be able to get into the shower with her tub bench without assist, to be able to get up 2 steps at sister's home with railings on both sides but too far apart to reach both sides, to be able to drive adapted car. Pt is staying at other sister's house with one step to get inside  and currently picking up L leg with UEs and then stepping in. Pt steps down leading with LLE. Pt wants to work on walking side to side as this is challenging and she had not yet progressed to this at rehab. Pt able to get in/out the car with assist only to bring BLEs into the car. Hoping to drive by June or July with adapted car (already has one). Pt needs assist bringing LEs into and out of hospital bed. Pt able to perform sit>stand without assist from [facility] with a pillow on the seat for a boost. Pt denies any falls in the past 6 months. Pt has been increasing her time alone at home up to a few hours at a time to become more independent. Pt has both manual and power WC. Uses power WC at home, uses manual WC out in community. Pt able to self propel manual  WC. Pt needs assist putting pants around ankles but she can pull them up. Pt can put on her shoes but needs help getting her leg up to do so. Pt able to tie her shoelaces on her own. Pt has a tub/shower unit at home with a tub bench. No grab bars in the shower. Has hand held shower head. Pt currently taking a sponge bath. Pt has a regular height toilet with a BSC over top. Pt with h/o L wrist fx with no precautions, pt wore her soft brace to evaluation for comfort but reports her wrist has healed and is doing well following OT.     Currently in Pain?  No/denies            Treatment:  in // bars   Standing weight shifts; cues for hip shift ; BUE , cueing for hip movement rather than shoulder for optimal carryover for step negotiation/ambulation with foot clearance.     in // bars: step over raised theraband 2 5x each side. able to clear 5x on R successfully, able to clear 2/5 of LLE. [place] Kitchen Challenging for LLE , seated rest break between each LE.   In // bars: large step 6x each LE. BUE support   In // bars: step back 6x each LE, BUE support ; very fatiguing to patient. More challenging for LLE coordination.    Sit to stand technique with UE support, decreased reliance upon UE's with improved eccentric control, no "plopping". Multiple performances throughout session      Seated:   Weighted ball: (2000 gr) -semi circle cross body for opp knee reaches for abdominal activation x 12 each side -chest press 12x -straight arm raise 12x    RTB abduction 12x  RTB seated arms crossed hip flexion isometric with upright posture for core/trunk stabilization 60 seconds  GTB rows 12x arms bent   Seated modified windmills 8x each side   Seated hamstring stretch 60 seconds with PT overpressure each LE    Seated modified LAQ 10x each LE ; RLE requires min A for performance.      Pt educated throughout session about proper posture and technique with exercises. Improved exercise technique, movement  at target joints, use of target muscles after min verbal, visual, tactile cues   Seated rest breaks and use of ice pack utilized to keep body temperature at functional range to reduce MS exacerbation.                      PT Education - 01/06/19 1101    Education provided  Yes  Education Details  exercise technique, stability, body mechanics    Person(s) Educated  Patient    Methods  Explanation;Demonstration;Tactile cues;Verbal cues    Comprehension  Verbalized understanding;Returned demonstration;Verbal cues required;Tactile cues required       PT Short Term Goals - 11/25/18 1249      PT SHORT TERM GOAL #1   Title  Patient will be compliant with HEP 4-5 days/week for improved carryover between sessions.     Baseline  HEP compliancy between sessions 6/17 compliant    Time  2    Period  Weeks    Status  Achieved    Target Date  10/15/18      PT SHORT TERM GOAL #2   Title  Patient will report no falls/LOB to indicate improved stability with mobility     Baseline  6/17: no falls, one episode of instability 7/20: no falls    Time  2    Period  Weeks    Status  Achieved    Target Date  11/06/18        PT Long Term Goals - 01/01/19 0001      PT LONG TERM GOAL #1   Title  Patient will perform TUG in 40 seconds or less with walker to improve safe negotiation of environment.     Baseline  5/26: 61 seconds 6/17: 60 seconds 7/20: 59 seconds 8/26:  55 seconds    Time  8    Period  Weeks    Status  Partially Met    Target Date  01/20/19      PT LONG TERM GOAL #2   Title  Pt's 33mT will improve to at least 0.5 m/s for improved safety ambulating in home    Baseline  0.14 m/s; 5/15: .232m  7/10: .36 m/s  8/20: .42 m/s  9/26: 0.2636m10/14: .26 m/s improved hip and knee clearance 11/15: 0.64m64mmproved hip and knee clearance; 12/5: .29 m/s improved step length and forward moment: decreased foot drag. 05/09/2018: 0.21 m/s 2/6: .237 m/s 5/26: .23 m/s 6/17: .24 m/s   7/20: 0.26 m/s 8/26: 0.26 m/s    Time  8    Period  Weeks    Status  Partially Met    Target Date  01/20/19      PT LONG TERM GOAL #3   Title  Pt's Bil hip F strength will improve to at least 2+/5 BLEs for improved gait mechanics and safety    Baseline  L: 2/5, R: 1/5 7/10: 2/5 bilaterally  8/20: 2/5 bilat 9/26: 2/5 bilat 10/14: 2/5 bilaterally ,improved hip flexor muscle activation 2/5: improved hip flexor and quad activation: 12/5: 2/5 ; 05/09/2018: 2/5 2/6: 3/5 knee, 2 to 2+/5 hip 5/26: 2-/5 hips 3/5 knees 6/17: 2-/5 hips 3/5 knees    Time  8    Period  Weeks    Status  Partially Met    Target Date  01/20/19      PT LONG TERM GOAL #4   Title  Pt's ABC Scale will improve to at least 40% to demonstrate improved balance    Baseline  28.13% 5/15; 24.4% 7/10: 23.4% 8/20: 22. 2% 9/26: 25.3% 10/14: 26.5% 11/15: 24.38% 12/5: 26%; 05/09/2018: 29% 2/6: 23.75% 526: 30% 6/17: 26.8%  7/2: 28% 8/26: 30.3%    Time  8    Period  Weeks    Status  Partially Met    Target Date  01/20/19      PT LONG TERM GOAL #  5   Title  Patient will negotiate one step ~4 inches to enter house independently to increase functional independence in natural environment    Baseline  /20: unable to do 9/26: patient too fatigued/not feeling well to perform 10/14: unable to perform at this time 11/15: unable to perform at this time; 12/5: able to lift 1 inch; 05/09/2018: unable to clear 4" step, able to lift foot ~ 1in 5/26: has been unable to perform 6/17: able to lift foot 1.25 inch 8/26: has been able to step up onto 1.5 inch step in previous sessions    Time  8    Period  Weeks    Status  Partially Met    Target Date  01/20/19      PT LONG TERM GOAL #6   Title  Patient will negotiate 30 ft of changing surfaces outside such as pavement and parking lot to allow patient to walk into church.    Baseline  12/5: does not walk outside 2/24: unable to test due to rain 5/26: unable since COVID 6/17: raining 7/20 too hot to test 8/26:  unable to walk outside due to heat and COVID    Time  8    Period  Weeks    Status  Deferred    Target Date  01/20/19      PT LONG TERM GOAL #7   Title  Patient will stand 5 minutes to increase standing duration to allow patient to return to washing dishes and clothes.     Baseline  12/5: 56 seconds; 05/09/2018: 1 min 18 sec 2/6: 2 minutes last session 5/26: 1 minute 36 seconds limited by breathing 6/17: terminated due to abdomen pain  7/20 terminated due to overheating 8/26: able to stand 3 minutes at home    Time  8    Period  Weeks    Status  Partially Met    Target Date  01/20/19            Plan - 01/06/19 1103    Clinical Impression Statement  Patient presents with good motivation to today's session. Demonstrated ability to clear 2" band across // bars with both legs, however is more challenged with LLE due to weight acceptance requirement on RLE. Patient is progressing with standing stability and strengthening interventions, being able to step backwards with improved coordination and decreased compensatory trunk flexion. The patient will continue to benefit from skilled therapeutic intervention to address deficits in strength, mobility, balance, and function for improved overall QOL and safety    Rehab Potential  Good    PT Frequency  2x / week    PT Duration  8 weeks    PT Treatment/Interventions  ADLs/Self Care Home Management;Aquatic Therapy;Biofeedback;Cryotherapy;Electrical Stimulation;Iontophoresis 63m/ml Dexamethasone;Traction;Moist Heat;Ultrasound;DME Instruction;Gait training;Stair training;Functional mobility training;Therapeutic activities;Therapeutic exercise;Balance training;Neuromuscular re-education;Patient/family education;Orthotic Fit/Training;Wheelchair mobility training;Manual techniques;Compression bandaging;Passive range of motion;Dry needling;Energy conservation;Taping;Splinting    PT Next Visit Plan  strength/balance    PT Home Exercise Plan  to initiate next  session (please incorporate hip flexor and adductor stretches in HEP if possible)    Consulted and Agree with Plan of Care  Patient       Patient will benefit from skilled therapeutic intervention in order to improve the following deficits and impairments:  Abnormal gait, Decreased activity tolerance, Decreased balance, Decreased coordination, Decreased endurance, Decreased knowledge of use of DME, Decreased mobility, Decreased range of motion, Decreased safety awareness, Decreased strength, Difficulty walking, Impaired perceived functional ability, Impaired sensation, Impaired tone, Impaired UE  functional use, Improper body mechanics, Postural dysfunction  Visit Diagnosis: Muscle weakness (generalized)  Other abnormalities of gait and mobility  Unsteadiness on feet     Problem List Patient Active Problem List   Diagnosis Date Noted  . Abscess of female pelvis   . SVT (supraventricular tachycardia) ([place])   . Radial styloid tenosynovitis 03/12/2018  . Wheelchair confinement 02/27/2018  . Localized osteoporosis with current pathological fracture with routine healing 01/19/2017  . Wrist fracture 01/16/2017  . Sprain of ankle 03/23/2016  . Closed fracture of lateral malleolus 03/16/2016  . Health care maintenance 01/24/2016  . Blood pressure elevated without history of HTN 10/25/2015  . Essential hypertension 10/25/2015  . Multiple sclerosis ([place]) 10/02/2015  . Chronic left shoulder pain 07/19/2015  . Multiple sclerosis exacerbation ([place]) 07/14/2015  . MS (multiple sclerosis) ([place]) 11/26/2014  . Increased body mass index 11/26/2014  . HPV test positive 11/26/2014  . Status post laparoscopic supracervical hysterectomy 11/26/2014  . Galactorrhea 11/26/2014  . Back ache 05/21/2014  . Adiposity 05/21/2014  . Disordered sleep 05/21/2014  . Muscle spasticity 05/21/2014  . Spasticity 05/21/2014  . Calculus of kidney 12/09/2013  . Renal colic 02/08/4960  . Hypercholesteremia  08/19/2013  . Hereditary and idiopathic neuropathy 08/19/2013  . Hypercholesterolemia without hypertriglyceridemia 08/19/2013  . Bladder infection, chronic 07/25/2012  . Disorder of bladder function 07/25/2012  . Incomplete bladder emptying 07/25/2012  . Microscopic hematuria 07/25/2012  . Right upper quadrant pain 07/25/2012   Janna Arch, PT, DPT   01/06/2019, 11:04 AM  [place] MAIN [facility] SERVICES [address] [place], [place], [address] Phone: [phone]   Fax:  [phone]  Name: Lisa Williams MRN: [id number] Date of Birth: 1960/11/01

## 2019-01-07 ENCOUNTER — Ambulatory Visit: Payer: Medicare HMO

## 2019-01-08 ENCOUNTER — Ambulatory Visit: Payer: Medicare HMO | Attending: Neurology

## 2019-01-08 ENCOUNTER — Other Ambulatory Visit: Payer: Self-pay

## 2019-01-08 DIAGNOSIS — M6281 Muscle weakness (generalized): Secondary | ICD-10-CM

## 2019-01-08 DIAGNOSIS — R2689 Other abnormalities of gait and mobility: Secondary | ICD-10-CM

## 2019-01-08 DIAGNOSIS — R2681 Unsteadiness on feet: Secondary | ICD-10-CM

## 2019-01-08 NOTE — Therapy (Signed)
[place] MAIN [facility] SERVICES [address] [place], [place], [address] Phone: [phone]   Fax:  [phone]  Physical Therapy Treatment  Patient Details  Name: Lisa Williams MRN: [id number] Date of Birth: 1960/05/11 Referring Provider (PT): Gurney Maxin, MD   Encounter Date: 01/08/2019  PT End of Session - 01/08/19 1104    Visit Number  112    Number of Visits  115    Date for PT Re-Evaluation  01/20/19    Authorization Type  2/10 with PN 01/01/19    PT Start Time  1015    PT Stop Time  1059    PT Time Calculation (min)  44 min    Equipment Utilized During Treatment  Gait belt    Activity Tolerance  Patient tolerated treatment well;Other (comment)   heat intolerance   Behavior During Therapy  WFL for tasks assessed/performed       Past Medical History:  Diagnosis Date  . Abdominal pain, right upper quadrant   . Back pain   . Calculus of kidney 12/09/2013  . Chronic back pain    unspecified  . Chronic left shoulder pain 07/19/2015  . Functional disorder of bladder    other  . Galactorrhea 11/26/2014   Chronic   . Hereditary and idiopathic neuropathy 08/19/2013  . HPV test positive   . Hypercholesteremia 08/19/2013  . Incomplete bladder emptying   . Microscopic hematuria   . MS (multiple sclerosis) ([place])   . Muscle spasticity 05/21/2014  . Nonspecific findings on examination of urine    other  . Osteopenia   . Status post laparoscopic supracervical hysterectomy 11/26/2014  . Tobacco user 11/26/2014  . Wrist fracture     Past Surgical History:  Procedure Laterality Date  . bilateral tubal ligation  1996  . BREAST CYST EXCISION Left 2002  . KNEE SURGERY     right  . LAPAROSCOPIC SUPRACERVICAL HYSTERECTOMY  08/05/2013  . ORIF WRIST FRACTURE Left 01/17/2017   Procedure: OPEN REDUCTION INTERNAL FIXATION (ORIF) WRIST FRACTURE;  Surgeon: Lovell Sheehan, MD;  Location: [facility] ORS;  Service: Orthopedics;  Laterality: Left;   . TUBAL LIGATION Bilateral   . VAGINAL HYSTERECTOMY  03/2006    There were no vitals filed for this visit.  Subjective Assessment - 01/08/19 1103    Subjective  Patient reports compliance with HEP. No falls or LOB since last session, no pain.    Pertinent History  Pt is a 58 y/o F who presents with BLE weakness and difficulty ambulating due to h/o MS that was diagnosed in 1995. Pt denies any pain associated. Pt was admitted to the hospital in November 2018 due to MS exacerbation. Nov-Dec to HHPT-->[facility] until end of Jan. After rehab the pt was able to ambulate up to 100 ft with the RW. Pt has Bil AFO, her therapist at [facility] thinks she needs a new AFO as her feet still drag when she ambulates, pt reports HHPT mentioned a spring AFO. Pt has custom AFOs that were originally made 4 years ago. Goals: improve balance, to be able to get into the shower with her tub bench without assist, to be able to get up 2 steps at sister's home with railings on both sides but too far apart to reach both sides, to be able to drive adapted car. Pt is staying at other sister's house with one step to get inside and currently picking up L leg with UEs and then stepping in. Pt  steps down leading with LLE. Pt wants to work on walking side to side as this is challenging and she had not yet progressed to this at rehab. Pt able to get in/out the car with assist only to bring BLEs into the car. Hoping to drive by June or July with adapted car (already has one). Pt needs assist bringing LEs into and out of hospital bed. Pt able to perform sit>stand without assist from [facility] with a pillow on the seat for a boost. Pt denies any falls in the past 6 months. Pt has been increasing her time alone at home up to a few hours at a time to become more independent. Pt has both manual and power WC. Uses power WC at home, uses manual WC out in community. Pt able to self propel manual WC. Pt needs assist putting pants around ankles but she  can pull them up. Pt can put on her shoes but needs help getting her leg up to do so. Pt able to tie her shoelaces on her own. Pt has a tub/shower unit at home with a tub bench. No grab bars in the shower. Has hand held shower head. Pt currently taking a sponge bath. Pt has a regular height toilet with a BSC over top. Pt with h/o L wrist fx with no precautions, pt wore her soft brace to evaluation for comfort but reports her wrist has healed and is doing well following OT.     Currently in Pain?  No/denies       With RW Standing weight shift with RW: focus on gluteal and abdominal activation to activate LE's. x12 each LE Standing step 10x each leg, , cueing for upright posture with abdominal activation Improved step length each LE with improved foot clearance.  Ambulation 38f with no dragging of L foot. Able to perform with good foot clearance, weight shift and step length.  Wheelchair follow with CGA.    seated   2.5lb bar -row seated 15x with focus on scapular squeeze  -diagonal row for single scapular retraction/depression 12x each side   - semicircle with arms at 90 degrees flexion rotating side to side 10x   Seated hamstring stretch 60 seconds each side.  Seated modified LAQ 10x ; last two of RLE requires mod A.  Seated gluteal squeezes 10x 5 second holds Red theraband hip abduction 10x  upright posture arms across chest, knee /hip flexion isometric marching 60 seconds   Seated rest breaks and use of ice pack utilized to keep body temperature at functional range to reduce MS exacerbation.     Pt educated throughout session about proper posture and technique with exercises. Improved exercise technique, movement at target joints, use of target muscles after min to mod verbal, visual, tactile cues.                         PT Education - 01/08/19 1104    Education provided  Yes    Education Details  exercise technique, weight shift, body mechanics    Person(s)  Educated  Patient    Methods  Explanation;Demonstration;Tactile cues    Comprehension  Verbalized understanding;Returned demonstration;Verbal cues required;Tactile cues required       PT Short Term Goals - 11/25/18 1249      PT SHORT TERM GOAL #1   Title  Patient will be compliant with HEP 4-5 days/week for improved carryover between sessions.     Baseline  HEP compliancy between  sessions 6/17 compliant    Time  2    Period  Weeks    Status  Achieved    Target Date  10/15/18      PT SHORT TERM GOAL #2   Title  Patient will report no falls/LOB to indicate improved stability with mobility     Baseline  6/17: no falls, one episode of instability 7/20: no falls    Time  2    Period  Weeks    Status  Achieved    Target Date  11/06/18        PT Long Term Goals - 01/01/19 0001      PT LONG TERM GOAL #1   Title  Patient will perform TUG in 40 seconds or less with walker to improve safe negotiation of environment.     Baseline  5/26: 61 seconds 6/17: 60 seconds 7/20: 59 seconds 8/26:  55 seconds    Time  8    Period  Weeks    Status  Partially Met    Target Date  01/20/19      PT LONG TERM GOAL #2   Title  Pt's 9mT will improve to at least 0.5 m/s for improved safety ambulating in home    Baseline  0.14 m/s; 5/15: .248m  7/10: .36 m/s  8/20: .42 m/s  9/26: 0.2631m10/14: .26 m/s improved hip and knee clearance 11/15: 0.68m77mmproved hip and knee clearance; 12/5: .29 m/s improved step length and forward moment: decreased foot drag. 05/09/2018: 0.21 m/s 2/6: .237 m/s 5/26: .23 m/s 6/17: .24 m/s  7/20: 0.26 m/s 8/26: 0.26 m/s    Time  8    Period  Weeks    Status  Partially Met    Target Date  01/20/19      PT LONG TERM GOAL #3   Title  Pt's Bil hip F strength will improve to at least 2+/5 BLEs for improved gait mechanics and safety    Baseline  L: 2/5, R: 1/5 7/10: 2/5 bilaterally  8/20: 2/5 bilat 9/26: 2/5 bilat 10/14: 2/5 bilaterally ,improved hip flexor muscle activation 2/5:  improved hip flexor and quad activation: 12/5: 2/5 ; 05/09/2018: 2/5 2/6: 3/5 knee, 2 to 2+/5 hip 5/26: 2-/5 hips 3/5 knees 6/17: 2-/5 hips 3/5 knees    Time  8    Period  Weeks    Status  Partially Met    Target Date  01/20/19      PT LONG TERM GOAL #4   Title  Pt's ABC Scale will improve to at least 40% to demonstrate improved balance    Baseline  28.13% 5/15; 24.4% 7/10: 23.4% 8/20: 22. 2% 9/26: 25.3% 10/14: 26.5% 11/15: 24.38% 12/5: 26%; 05/09/2018: 29% 2/6: 23.75% 526: 30% 6/17: 26.8%  7/2: 28% 8/26: 30.3%    Time  8    Period  Weeks    Status  Partially Met    Target Date  01/20/19      PT LONG TERM GOAL #5   Title  Patient will negotiate one step ~4 inches to enter house independently to increase functional independence in natural environment    Baseline  /20: unable to do 9/26: patient too fatigued/not feeling well to perform 10/14: unable to perform at this time 11/15: unable to perform at this time; 12/5: able to lift 1 inch; 05/09/2018: unable to clear 4" step, able to lift foot ~ 1in 5/26: has been unable to perform 6/17: able to lift foot 1.25  inch 8/26: has been able to step up onto 1.5 inch step in previous sessions    Time  8    Period  Weeks    Status  Partially Met    Target Date  01/20/19      PT LONG TERM GOAL #6   Title  Patient will negotiate 30 ft of changing surfaces outside such as pavement and parking lot to allow patient to walk into church.    Baseline  12/5: does not walk outside 2/24: unable to test due to rain 5/26: unable since COVID 6/17: raining 7/20 too hot to test 8/26: unable to walk outside due to heat and COVID    Time  8    Period  Weeks    Status  Deferred    Target Date  01/20/19      PT LONG TERM GOAL #7   Title  Patient will stand 5 minutes to increase standing duration to allow patient to return to washing dishes and clothes.     Baseline  12/5: 56 seconds; 05/09/2018: 1 min 18 sec 2/6: 2 minutes last session 5/26: 1 minute 36 seconds limited by  breathing 6/17: terminated due to abdomen pain  7/20 terminated due to overheating 8/26: able to stand 3 minutes at home    Time  8    Period  Weeks    Status  Partially Met    Target Date  01/20/19            Plan - 01/08/19 1106    Clinical Impression Statement  Patient presents with improved weight shift and step mechanics during ambulation with no episodes of foot drag. Patient is utilizing her lower extremities more allowing for decreased reliance upon UE's during mobility. The patient will continue to benefit from skilled therapeutic intervention to address deficits in strength, mobility, balance, and function for improved overall QOL and safety    Rehab Potential  Good    PT Frequency  2x / week    PT Duration  8 weeks    PT Treatment/Interventions  ADLs/Self Care Home Management;Aquatic Therapy;Biofeedback;Cryotherapy;Electrical Stimulation;Iontophoresis 33m/ml Dexamethasone;Traction;Moist Heat;Ultrasound;DME Instruction;Gait training;Stair training;Functional mobility training;Therapeutic activities;Therapeutic exercise;Balance training;Neuromuscular re-education;Patient/family education;Orthotic Fit/Training;Wheelchair mobility training;Manual techniques;Compression bandaging;Passive range of motion;Dry needling;Energy conservation;Taping;Splinting    PT Next Visit Plan  strength/balance    PT Home Exercise Plan  to initiate next session (please incorporate hip flexor and adductor stretches in HEP if possible)    Consulted and Agree with Plan of Care  Patient       Patient will benefit from skilled therapeutic intervention in order to improve the following deficits and impairments:  Abnormal gait, Decreased activity tolerance, Decreased balance, Decreased coordination, Decreased endurance, Decreased knowledge of use of DME, Decreased mobility, Decreased range of motion, Decreased safety awareness, Decreased strength, Difficulty walking, Impaired perceived functional ability,  Impaired sensation, Impaired tone, Impaired UE functional use, Improper body mechanics, Postural dysfunction  Visit Diagnosis: Muscle weakness (generalized)  Other abnormalities of gait and mobility  Unsteadiness on feet     Problem List Patient Active Problem List   Diagnosis Date Noted  . Abscess of female pelvis   . [place]   . Radial styloid tenosynovitis 03/12/2018  . Wheelchair confinement 02/27/2018  . Localized osteoporosis with current pathological fracture with routine healing 01/19/2017  . Wrist fracture 01/16/2017  . Sprain of ankle 03/23/2016  . Closed fracture of lateral malleolus 03/16/2016  . Health care maintenance 01/24/2016  . Blood pressure elevated without  history of HTN 10/25/2015  . Essential hypertension 10/25/2015  . Multiple sclerosis ([place]) 10/02/2015  . Chronic left shoulder pain 07/19/2015  . Multiple sclerosis exacerbation ([place]) 07/14/2015  . MS (multiple sclerosis) ([place]) 11/26/2014  . Increased body mass index 11/26/2014  . HPV test positive 11/26/2014  . Status post laparoscopic supracervical hysterectomy 11/26/2014  . Galactorrhea 11/26/2014  . Back ache 05/21/2014  . Adiposity 05/21/2014  . Disordered sleep 05/21/2014  . Muscle spasticity 05/21/2014  . Spasticity 05/21/2014  . Calculus of kidney 12/09/2013  . Renal colic 02/77/4128  . Hypercholesteremia 08/19/2013  . Hereditary and idiopathic neuropathy 08/19/2013  . Hypercholesterolemia without hypertriglyceridemia 08/19/2013  . Bladder infection, chronic 07/25/2012  . Disorder of bladder function 07/25/2012  . Incomplete bladder emptying 07/25/2012  . Microscopic hematuria 07/25/2012  . Right upper quadrant pain 07/25/2012   Janna Arch, PT, DPT   01/08/2019, 11:07 AM  [place] MAIN [facility] SERVICES [address] [place], [place], [address] Phone: [phone]   Fax:  [phone]  Name: Lisa [place]  Williams MRN: [id number] Date of Birth: [birth year]

## 2019-01-09 ENCOUNTER — Ambulatory Visit: Payer: Medicare HMO

## 2019-01-15 ENCOUNTER — Other Ambulatory Visit: Payer: Self-pay

## 2019-01-15 ENCOUNTER — Ambulatory Visit: Payer: Medicare HMO

## 2019-01-15 DIAGNOSIS — R2689 Other abnormalities of gait and mobility: Secondary | ICD-10-CM

## 2019-01-15 DIAGNOSIS — M6281 Muscle weakness (generalized): Secondary | ICD-10-CM | POA: Diagnosis not present

## 2019-01-15 DIAGNOSIS — R2681 Unsteadiness on feet: Secondary | ICD-10-CM

## 2019-01-15 NOTE — Therapy (Signed)
[place] [facility] SERVICES [address] [place], [place], [address] Phone: [phone]   Fax:  [phone]  Physical Therapy Treatment/RECERT  Patient Details  Name: Lisa Williams MRN: [id number] Date of Birth: Jun 03, 1960 Referring Provider (PT): Gurney Maxin, MD   Encounter Date: 01/15/2019  PT End of Session - 01/15/19 1237    Visit Number  113    Number of Visits  129    Date for PT Re-Evaluation  03/12/19    Authorization Type  3/10 with PN 01/01/19    PT Start Time  1015    PT Stop Time  1055    PT Time Calculation (min)  40 min    Equipment Utilized During Treatment  Gait belt    Activity Tolerance  Patient tolerated treatment well;Other (comment)   heat intolerance   Behavior During Therapy  WFL for tasks assessed/performed       Past Medical History:  Diagnosis Date  . Abdominal pain, right upper quadrant   . Back pain   . Calculus of kidney 12/09/2013  . Chronic back pain    unspecified  . Chronic left shoulder pain 07/19/2015  . Functional disorder of bladder    other  . Galactorrhea 11/26/2014   Chronic   . Hereditary and idiopathic neuropathy 08/19/2013  . HPV test positive   . Hypercholesteremia 08/19/2013  . Incomplete bladder emptying   . Microscopic hematuria   . MS (multiple sclerosis) ([place])   . Muscle spasticity 05/21/2014  . Nonspecific findings on examination of urine    other  . Osteopenia   . Status post laparoscopic supracervical hysterectomy 11/26/2014  . Tobacco user 11/26/2014  . Wrist fracture     Past Surgical History:  Procedure Laterality Date  . bilateral tubal ligation  1996  . BREAST CYST EXCISION Left 2002  . KNEE SURGERY     right  . LAPAROSCOPIC SUPRACERVICAL HYSTERECTOMY  08/05/2013  . ORIF WRIST FRACTURE Left 01/17/2017   Procedure: OPEN REDUCTION INTERNAL FIXATION (ORIF) WRIST FRACTURE;  Surgeon: Lovell Sheehan, MD;  Location: [facility] ORS;  Service: Orthopedics;  Laterality:  Left;  . TUBAL LIGATION Bilateral   . VAGINAL HYSTERECTOMY  03/2006    There were no vitals filed for this visit.  Subjective Assessment - 01/15/19 1234    Subjective  Patient reports having some leg spasms yesterday. Is doing better today but continues to have some fatigue. Has been compliant with HEP.    Pertinent History  Pt is a 58 y/o F who presents with BLE weakness and difficulty ambulating due to h/o MS that was diagnosed in 1995. Pt denies any pain associated. Pt was admitted to the hospital in November 2018 due to MS exacerbation. Nov-Dec to HHPT-->[facility] until end of Jan. After rehab the pt was able to ambulate up to 100 ft with the RW. Pt has Bil AFO, her therapist at [facility] thinks she needs a new AFO as her feet still drag when she ambulates, pt reports HHPT mentioned a spring AFO. Pt has custom AFOs that were originally made 4 years ago. Goals: improve balance, to be able to get into the shower with her tub bench without assist, to be able to get up 2 steps at sister's home with railings on both sides but too far apart to reach both sides, to be able to drive adapted car. Pt is staying at other sister's house with one step to get inside and currently picking up L  leg with UEs and then stepping in. Pt steps down leading with LLE. Pt wants to work on walking side to side as this is challenging and she had not yet progressed to this at rehab. Pt able to get in/out the car with assist only to bring BLEs into the car. Hoping to drive by June or July with adapted car (already has one). Pt needs assist bringing LEs into and out of hospital bed. Pt able to perform sit>stand without assist from [facility] with a pillow on the seat for a boost. Pt denies any falls in the past 6 months. Pt has been increasing her time alone at home up to a few hours at a time to become more independent. Pt has both manual and power WC. Uses power WC at home, uses manual WC out in community. Pt able to self propel  manual WC. Pt needs assist putting pants around ankles but she can pull them up. Pt can put on her shoes but needs help getting her leg up to do so. Pt able to tie her shoelaces on her own. Pt has a tub/shower unit at home with a tub bench. No grab bars in the shower. Has hand held shower head. Pt currently taking a sponge bath. Pt has a regular height toilet with a BSC over top. Pt with h/o L wrist fx with no precautions, pt wore her soft brace to evaluation for comfort but reports her wrist has healed and is doing well following OT.     Currently in Pain?  No/denies              In // bars: Standing weight shift with focus on hip shift onto limb for weight acceptance rather than pulling with UE 10x each side  Standing weight shift with step forward BUE support focus on weight shift then step for gait mechanics carryover 8x each LE  Standing hip extension 6x each LE; BUE support  seated    Seated:  Seated hamstring stretch 60 seconds each side.  Seated modified LAQ 10x ; last two of RLE requires mod A.  Seated gluteal squeezes 15x 5 second holds Red theraband hip abduction 10x  seated TrA contraction 15x 3 second holds Cross body jabs with increasing difficulty and range, increasing challenge to L, R, duck for core stability and postural reactions. -Jab L , R duck for cross body coordination and postural control/righting reactions 10x -Jab R, L duck  for cross body coordination and postural control/righting reactions 10x -Cross body elbow for abdominal strengthening and postural challenge x 60 seconds -Posterior elbow row 10x with focus on shoulder position and posterior chain reaction  Seated rest breaks and use of ice pack utilized to keep body temperature at functional range to reduce MS exacerbation.      Pt educated throughout session about proper posture and technique with exercises. Improved exercise technique, movement at target joints, use of target muscles after min to mod  verbal, visual, tactile cues.                          PT Education - 01/15/19 1237    Education provided  Yes    Education Details  exercise technique, body mechanics    Person(s) Educated  Patient    Methods  Explanation;Demonstration;Tactile cues;Verbal cues    Comprehension  Verbalized understanding;Verbal cues required;Returned demonstration;Tactile cues required       PT Short Term Goals - 01/15/19 1241  PT SHORT TERM GOAL #1   Title  Patient will be compliant with HEP 4-5 days/week for improved carryover between sessions.     Baseline  HEP compliancy between sessions 6/17 compliant    Time  2    Period  Weeks    Status  Achieved    Target Date  10/15/18      PT SHORT TERM GOAL #2   Title  Patient will report no falls/LOB to indicate improved stability with mobility     Baseline  6/17: no falls, one episode of instability 7/20: no falls    Time  2    Period  Weeks    Status  Achieved    Target Date  11/06/18        PT Long Term Goals - 01/15/19 0001      PT LONG TERM GOAL #1   Title  Patient will perform TUG in 40 seconds or less with walker to improve safe negotiation of environment.     Baseline  5/26: 61 seconds 6/17: 60 seconds 7/20: 59 seconds 8/26:  55 seconds    Time  8    Period  Weeks    Status  Partially Met    Target Date  03/12/19      PT LONG TERM GOAL #2   Title  Pt's 81mT will improve to at least 0.5 m/s for improved safety ambulating in home    Baseline  0.14 m/s; 5/15: .273m  7/10: .36 m/s  8/20: .42 m/s  9/26: 0.2659m10/14: .26 m/s improved hip and knee clearance 11/15: 0.66m76mmproved hip and knee clearance; 12/5: .29 m/s improved step length and forward moment: decreased foot drag. 05/09/2018: 0.21 m/s 2/6: .237 m/s 5/26: .23 m/s 6/17: .24 m/s  7/20: 0.26 m/s 8/26: 0.26 m/s    Time  8    Period  Weeks    Status  Partially Met    Target Date  03/12/19      PT LONG TERM GOAL #3   Title  Pt's Bil hip F strength  will improve to at least 2+/5 BLEs for improved gait mechanics and safety    Baseline  L: 2/5, R: 1/5 7/10: 2/5 bilaterally  8/20: 2/5 bilat 9/26: 2/5 bilat 10/14: 2/5 bilaterally ,improved hip flexor muscle activation 2/5: improved hip flexor and quad activation: 12/5: 2/5 ; 05/09/2018: 2/5 2/6: 3/5 knee, 2 to 2+/5 hip 5/26: 2-/5 hips 3/5 knees 6/17: 2-/5 hips 3/5 knees    Time  8    Period  Weeks    Status  Partially Met    Target Date  03/12/19      PT LONG TERM GOAL #4   Title  Pt's ABC Scale will improve to at least 40% to demonstrate improved balance    Baseline  28.13% 5/15; 24.4% 7/10: 23.4% 8/20: 22. 2% 9/26: 25.3% 10/14: 26.5% 11/15: 24.38% 12/5: 26%; 05/09/2018: 29% 2/6: 23.75% 526: 30% 6/17: 26.8%  7/2: 28% 8/26: 30.3%    Time  8    Period  Weeks    Status  Partially Met    Target Date  03/12/19      PT LONG TERM GOAL #5   Title  Patient will negotiate one step ~4 inches to enter house independently to increase functional independence in natural environment    Baseline  /20: unable to do 9/26: patient too fatigued/not feeling well to perform 10/14: unable to perform at this time 11/15: unable to perform at this  time; 12/5: able to lift 1 inch; 05/09/2018: unable to clear 4" step, able to lift foot ~ 1in 5/26: has been unable to perform 6/17: able to lift foot 1.25 inch 8/26: has been able to step up onto 1.5 inch step in previous sessions    Time  8    Period  Weeks    Status  Partially Met    Target Date  03/12/19      PT LONG TERM GOAL #6   Title  Patient will negotiate 30 ft of changing surfaces outside such as pavement and parking lot to allow patient to walk into church.    Baseline  12/5: does not walk outside 2/24: unable to test due to rain 5/26: unable since COVID 6/17: raining 7/20 too hot to test 8/26: unable to walk outside due to heat and COVID    Time  8    Period  Weeks    Status  Deferred    Target Date  03/12/19      PT LONG TERM GOAL #7   Title  Patient will  stand 5 minutes to increase standing duration to allow patient to return to washing dishes and clothes.     Baseline  12/5: 56 seconds; 05/09/2018: 1 min 18 sec 2/6: 2 minutes last session 5/26: 1 minute 36 seconds limited by breathing 6/17: terminated due to abdomen pain  7/20 terminated due to overheating 8/26: able to stand 3 minutes at home    Time  8    Period  Weeks    Status  Partially Met    Target Date  03/12/19            Plan - 01/15/19 1240    Clinical Impression Statement  Patient's goals performed on 8/26 ( 3 sessions ago), please refer to this note for information on progress. Patient performed TUG faster than previous scores with improved turning speed with no LOB. Patient is progressing with standing tolerance at this time which is making ADLs at home easier per patient report. Patient is more fatigued this session than previous sessions requiring more frequent seated rest breaks. Patient's condition has the potential to improve in response to therapy. Maximum improvement is yet to be obtained. The anticipated improvement is attainable and reasonable in a generally predictable time. The patient will continue to benefit from skilled therapeutic intervention to address deficits in strength, mobility, balance, and function for improved overall QOL and safety    Rehab Potential  Good    PT Frequency  2x / week    PT Duration  8 weeks    PT Treatment/Interventions  ADLs/Self Care Home Management;Aquatic Therapy;Biofeedback;Cryotherapy;Electrical Stimulation;Iontophoresis 67m/ml Dexamethasone;Traction;Moist Heat;Ultrasound;DME Instruction;Gait training;Stair training;Functional mobility training;Therapeutic activities;Therapeutic exercise;Balance training;Neuromuscular re-education;Patient/family education;Orthotic Fit/Training;Wheelchair mobility training;Manual techniques;Compression bandaging;Passive range of motion;Dry needling;Energy conservation;Taping;Splinting    PT Next Visit  Plan  strength/balance    PT Home Exercise Plan  to initiate next session (please incorporate hip flexor and adductor stretches in HEP if possible)    Consulted and Agree with Plan of Care  Patient       Patient will benefit from skilled therapeutic intervention in order to improve the following deficits and impairments:  Abnormal gait, Decreased activity tolerance, Decreased balance, Decreased coordination, Decreased endurance, Decreased knowledge of use of DME, Decreased mobility, Decreased range of motion, Decreased safety awareness, Decreased strength, Difficulty walking, Impaired perceived functional ability, Impaired sensation, Impaired tone, Impaired UE functional use, Improper body mechanics, Postural dysfunction  Visit Diagnosis: Muscle weakness (  generalized)  Other abnormalities of gait and mobility  Unsteadiness on feet     Problem List Patient Active Problem List   Diagnosis Date Noted  . Abscess of female pelvis   . SVT (supraventricular tachycardia) ([place])   . Radial styloid tenosynovitis 03/12/2018  . Wheelchair confinement 02/27/2018  . Localized osteoporosis with current pathological fracture with routine healing 01/19/2017  . Wrist fracture 01/16/2017  . Sprain of ankle 03/23/2016  . Closed fracture of lateral malleolus 03/16/2016  . Health care maintenance 01/24/2016  . Blood pressure elevated without history of HTN 10/25/2015  . Essential hypertension 10/25/2015  . Multiple sclerosis ([place]) 10/02/2015  . Chronic left shoulder pain 07/19/2015  . Multiple sclerosis exacerbation ([place]) 07/14/2015  . MS (multiple sclerosis) ([place]) 11/26/2014  . Increased body mass index 11/26/2014  . HPV test positive 11/26/2014  . Status post laparoscopic supracervical hysterectomy 11/26/2014  . Galactorrhea 11/26/2014  . Back ache 05/21/2014  . Adiposity 05/21/2014  . Disordered sleep 05/21/2014  . Muscle spasticity 05/21/2014  . Spasticity 05/21/2014  . Calculus of kidney  12/09/2013  . Renal colic 25/18/9842  . Hypercholesteremia 08/19/2013  . Hereditary and idiopathic neuropathy 08/19/2013  . Hypercholesterolemia without hypertriglyceridemia 08/19/2013  . Bladder infection, chronic 07/25/2012  . Disorder of bladder function 07/25/2012  . Incomplete bladder emptying 07/25/2012  . Microscopic hematuria 07/25/2012  . Right upper quadrant pain 07/25/2012   Janna Arch, PT, DPT    01/15/2019, 12:46 PM  [place] [facility]IN [facility] SERVICES [address] [place], [place], [address] Phone: [phone]   Fax:  [phone]  Name: Lisa Williams MRN: [id number] Date of Birth: [birth year]

## 2019-01-20 ENCOUNTER — Ambulatory Visit: Payer: Medicare HMO

## 2019-01-20 ENCOUNTER — Other Ambulatory Visit: Payer: Self-pay

## 2019-01-20 DIAGNOSIS — M6281 Muscle weakness (generalized): Secondary | ICD-10-CM

## 2019-01-20 DIAGNOSIS — R2681 Unsteadiness on feet: Secondary | ICD-10-CM

## 2019-01-20 DIAGNOSIS — R2689 Other abnormalities of gait and mobility: Secondary | ICD-10-CM

## 2019-01-20 NOTE — Therapy (Signed)
[place] MAIN [facility] SERVICES [address] [place], [place], [address] Phone: [phone]   Fax:  [phone]  Physical Therapy Treatment  Patient Details  Name: Lisa Williams MRN: [id number] Date of Birth: May 16, 1960 Referring Provider (PT): Gurney Maxin, MD   Encounter Date: 01/20/2019  PT End of Session - 01/20/19 1115    Visit Number  114    Number of Visits  129    Date for PT Re-Evaluation  03/12/19    Authorization Type  4/10 with PN 01/01/19    PT Start Time  1015    PT Stop Time  1059    PT Time Calculation (min)  44 min    Equipment Utilized During Treatment  Gait belt    Activity Tolerance  Patient tolerated treatment well;Other (comment)   heat intolerance   Behavior During Therapy  WFL for tasks assessed/performed       Past Medical History:  Diagnosis Date  . Abdominal pain, right upper quadrant   . Back pain   . Calculus of kidney 12/09/2013  . Chronic back pain    unspecified  . Chronic left shoulder pain 07/19/2015  . Functional disorder of bladder    other  . Galactorrhea 11/26/2014   Chronic   . Hereditary and idiopathic neuropathy 08/19/2013  . HPV test positive   . Hypercholesteremia 08/19/2013  . Incomplete bladder emptying   . Microscopic hematuria   . MS (multiple sclerosis) ([place])   . Muscle spasticity 05/21/2014  . Nonspecific findings on examination of urine    other  . Osteopenia   . Status post laparoscopic supracervical hysterectomy 11/26/2014  . Tobacco user 11/26/2014  . Wrist fracture     Past Surgical History:  Procedure Laterality Date  . bilateral tubal ligation  1996  . BREAST CYST EXCISION Left 2002  . KNEE SURGERY     right  . LAPAROSCOPIC SUPRACERVICAL HYSTERECTOMY  08/05/2013  . ORIF WRIST FRACTURE Left 01/17/2017   Procedure: OPEN REDUCTION INTERNAL FIXATION (ORIF) WRIST FRACTURE;  Surgeon: Lovell Sheehan, MD;  Location: [facility] ORS;  Service: Orthopedics;  Laterality: Left;   . TUBAL LIGATION Bilateral   . VAGINAL HYSTERECTOMY  03/2006    There were no vitals filed for this visit.  Subjective Assessment - 01/20/19 1112    Subjective  Patient reports she had a busy weekend. Has been compliant with her HEP, no falls or LOB since last session.    Pertinent History  Pt is a 58 y/o F who presents with BLE weakness and difficulty ambulating due to h/o MS that was diagnosed in 1995. Pt denies any pain associated. Pt was admitted to the hospital in November 2018 due to MS exacerbation. Nov-Dec to HHPT-->[facility] until end of Jan. After rehab the pt was able to ambulate up to 100 ft with the RW. Pt has Bil AFO, her therapist at [facility] thinks she needs a new AFO as her feet still drag when she ambulates, pt reports HHPT mentioned a spring AFO. Pt has custom AFOs that were originally made 4 years ago. Goals: improve balance, to be able to get into the shower with her tub bench without assist, to be able to get up 2 steps at sister's home with railings on both sides but too far apart to reach both sides, to be able to drive adapted car. Pt is staying at other sister's house with one step to get inside and currently picking up L leg with  UEs and then stepping in. Pt steps down leading with LLE. Pt wants to work on walking side to side as this is challenging and she had not yet progressed to this at rehab. Pt able to get in/out the car with assist only to bring BLEs into the car. Hoping to drive by June or July with adapted car (already has one). Pt needs assist bringing LEs into and out of hospital bed. Pt able to perform sit>stand without assist from [facility] with a pillow on the seat for a boost. Pt denies any falls in the past 6 months. Pt has been increasing her time alone at home up to a few hours at a time to become more independent. Pt has both manual and power WC. Uses power WC at home, uses manual WC out in community. Pt able to self propel manual WC. Pt needs assist putting  pants around ankles but she can pull them up. Pt can put on her shoes but needs help getting her leg up to do so. Pt able to tie her shoelaces on her own. Pt has a tub/shower unit at home with a tub bench. No grab bars in the shower. Has hand held shower head. Pt currently taking a sponge bath. Pt has a regular height toilet with a BSC over top. Pt with h/o L wrist fx with no precautions, pt wore her soft brace to evaluation for comfort but reports her wrist has healed and is doing well following OT.     Currently in Pain?  No/denies       in // bars  Standing weight shifts; cues for hip shift ; BUE , cueing for hip movement rather than shoulderfor optimal carryover for step negotiation/ambulation with foot clearance   In // bars: large step forward 8x each LE. BUE support   Band across // bars mid thigh height: tactile/visual cue for knee/hip flexion for foot clearance, knee band 10x each LE.    Sit to stand technique with UE support, decreased reliance upon UE's with improved eccentric control, no "plopping". Multiple performances throughout session    seated   Cross body jabs with increasing difficulty and range, increasing challenge to L, R, duck for core stability and postural reactions. -Jab L , R duck for cross body coordination and postural control/righting reactions 10x -Jab R, L duck  for cross body coordination and postural control/righting reactions 10x -Cross body elbow for abdominal strengthening and postural challenge x 60 seconds -cross R/L upper cut L R 10x with cross body coordination and postural strengthening -cross R/L ; elbow L/R 10x with cross body coordination challenge and lateral abdomen activation.    RTB modified hamstring curl 15x each LE Hip IR/ER 15x each LE Scapular retractions 10x    Seated rest breaks and use of ice pack utilized to keep body temperature at functional range to reduce MS exacerbation.      Pt educated throughout session about proper  posture and technique with exercises. Improved exercise technique, movement at target joints, use of target muscles after min to mod verbal, visual, tactile cues.                  PT Education - 01/20/19 1113    Education provided  Yes    Education Details  exercise technique, body mechanics    Person(s) Educated  Patient    Methods  Explanation;Demonstration;Tactile cues;Verbal cues    Comprehension  Verbalized understanding;Returned demonstration;Verbal cues required;Tactile cues required  PT Short Term Goals - 01/15/19 1241      PT SHORT TERM GOAL #1   Title  Patient will be compliant with HEP 4-5 days/week for improved carryover between sessions.     Baseline  HEP compliancy between sessions 6/17 compliant    Time  2    Period  Weeks    Status  Achieved    Target Date  10/15/18      PT SHORT TERM GOAL #2   Title  Patient will report no falls/LOB to indicate improved stability with mobility     Baseline  6/17: no falls, one episode of instability 7/20: no falls    Time  2    Period  Weeks    Status  Achieved    Target Date  11/06/18        PT Long Term Goals - 01/15/19 0001      PT LONG TERM GOAL #1   Title  Patient will perform TUG in 40 seconds or less with walker to improve safe negotiation of environment.     Baseline  5/26: 61 seconds 6/17: 60 seconds 7/20: 59 seconds 8/26:  55 seconds    Time  8    Period  Weeks    Status  Partially Met    Target Date  03/12/19      PT LONG TERM GOAL #2   Title  Pt's 77mT will improve to at least 0.5 m/s for improved safety ambulating in home    Baseline  0.14 m/s; 5/15: .235m  7/10: .36 m/s  8/20: .42 m/s  9/26: 0.26101m10/14: .26 m/s improved hip and knee clearance 11/15: 0.101m45mmproved hip and knee clearance; 12/5: .29 m/s improved step length and forward moment: decreased foot drag. 05/09/2018: 0.21 m/s 2/6: .237 m/s 5/26: .23 m/s 6/17: .24 m/s  7/20: 0.26 m/s 8/26: 0.26 m/s    Time  8    Period  Weeks     Status  Partially Met    Target Date  03/12/19      PT LONG TERM GOAL #3   Title  Pt's Bil hip F strength will improve to at least 2+/5 BLEs for improved gait mechanics and safety    Baseline  L: 2/5, R: 1/5 7/10: 2/5 bilaterally  8/20: 2/5 bilat 9/26: 2/5 bilat 10/14: 2/5 bilaterally ,improved hip flexor muscle activation 2/5: improved hip flexor and quad activation: 12/5: 2/5 ; 05/09/2018: 2/5 2/6: 3/5 knee, 2 to 2+/5 hip 5/26: 2-/5 hips 3/5 knees 6/17: 2-/5 hips 3/5 knees    Time  8    Period  Weeks    Status  Partially Met    Target Date  03/12/19      PT LONG TERM GOAL #4   Title  Pt's ABC Scale will improve to at least 40% to demonstrate improved balance    Baseline  28.13% 5/15; 24.4% 7/10: 23.4% 8/20: 22. 2% 9/26: 25.3% 10/14: 26.5% 11/15: 24.38% 12/5: 26%; 05/09/2018: 29% 2/6: 23.75% 526: 30% 6/17: 26.8%  7/2: 28% 8/26: 30.3%    Time  8    Period  Weeks    Status  Partially Met    Target Date  03/12/19      PT LONG TERM GOAL #5   Title  Patient will negotiate one step ~4 inches to enter house independently to increase functional independence in natural environment    Baseline  /20: unable to do 9/26: patient too fatigued/not feeling well to perform 10/14:  unable to perform at this time 11/15: unable to perform at this time; 12/5: able to lift 1 inch; 05/09/2018: unable to clear 4" step, able to lift foot ~ 1in 5/26: has been unable to perform 6/17: able to lift foot 1.25 inch 8/26: has been able to step up onto 1.5 inch step in previous sessions    Time  8    Period  Weeks    Status  Partially Met    Target Date  03/12/19      PT LONG TERM GOAL #6   Title  Patient will negotiate 30 ft of changing surfaces outside such as pavement and parking lot to allow patient to walk into church.    Baseline  12/5: does not walk outside 2/24: unable to test due to rain 5/26: unable since COVID 6/17: raining 7/20 too hot to test 8/26: unable to walk outside due to heat and COVID    Time  8     Period  Weeks    Status  Deferred    Target Date  03/12/19      PT LONG TERM GOAL #7   Title  Patient will stand 5 minutes to increase standing duration to allow patient to return to washing dishes and clothes.     Baseline  12/5: 56 seconds; 05/09/2018: 1 min 18 sec 2/6: 2 minutes last session 5/26: 1 minute 36 seconds limited by breathing 6/17: terminated due to abdomen pain  7/20 terminated due to overheating 8/26: able to stand 3 minutes at home    Time  8    Period  Weeks    Status  Partially Met    Target Date  03/12/19            Plan - 01/20/19 1120    Clinical Impression Statement  Patient initially challenged with foot clearance today, reporting her limbs "feels heavy". Use of a band across the // bars for visual/tactile cueing for hip/knee flexion improved body mechanics and foot clearance bilaterally. The patient will continue to benefit from skilled therapeutic intervention to address deficits in strength, mobility, balance, and function for improved overall QOL and safety    Rehab Potential  Good    PT Frequency  2x / week    PT Duration  8 weeks    PT Treatment/Interventions  ADLs/Self Care Home Management;Aquatic Therapy;Biofeedback;Cryotherapy;Electrical Stimulation;Iontophoresis 62m/ml Dexamethasone;Traction;Moist Heat;Ultrasound;DME Instruction;Gait training;Stair training;Functional mobility training;Therapeutic activities;Therapeutic exercise;Balance training;Neuromuscular re-education;Patient/family education;Orthotic Fit/Training;Wheelchair mobility training;Manual techniques;Compression bandaging;Passive range of motion;Dry needling;Energy conservation;Taping;Splinting    PT Next Visit Plan  strength/balance    PT Home Exercise Plan  to initiate next session (please incorporate hip flexor and adductor stretches in HEP if possible)    Consulted and Agree with Plan of Care  Patient       Patient will benefit from skilled therapeutic intervention in order to improve  the following deficits and impairments:  Abnormal gait, Decreased activity tolerance, Decreased balance, Decreased coordination, Decreased endurance, Decreased knowledge of use of DME, Decreased mobility, Decreased range of motion, Decreased safety awareness, Decreased strength, Difficulty walking, Impaired perceived functional ability, Impaired sensation, Impaired tone, Impaired UE functional use, Improper body mechanics, Postural dysfunction  Visit Diagnosis: Muscle weakness (generalized)  Other abnormalities of gait and mobility  Unsteadiness on feet     Problem List Patient Active Problem List   Diagnosis Date Noted  . Abscess of female pelvis   . [place]   . Radial styloid tenosynovitis 03/12/2018  . Wheelchair confinement  02/27/2018  . Localized osteoporosis with current pathological fracture with routine healing 01/19/2017  . Wrist fracture 01/16/2017  . Sprain of ankle 03/23/2016  . Closed fracture of lateral malleolus 03/16/2016  . Health care maintenance 01/24/2016  . Blood pressure elevated without history of HTN 10/25/2015  . Essential hypertension 10/25/2015  . Multiple sclerosis ([place]) 10/02/2015  . Chronic left shoulder pain 07/19/2015  . Multiple sclerosis exacerbation ([place]) 07/14/2015  . MS (multiple sclerosis) ([place]) 11/26/2014  . Increased body mass index 11/26/2014  . HPV test positive 11/26/2014  . Status post laparoscopic supracervical hysterectomy 11/26/2014  . Galactorrhea 11/26/2014  . Back ache 05/21/2014  . Adiposity 05/21/2014  . Disordered sleep 05/21/2014  . Muscle spasticity 05/21/2014  . Spasticity 05/21/2014  . Calculus of kidney 12/09/2013  . Renal colic 16/02/9603  . Hypercholesteremia 08/19/2013  . Hereditary and idiopathic neuropathy 08/19/2013  . Hypercholesterolemia without hypertriglyceridemia 08/19/2013  . Bladder infection, chronic 07/25/2012  . Disorder of bladder function 07/25/2012  . Incomplete  bladder emptying 07/25/2012  . Microscopic hematuria 07/25/2012  . Right upper quadrant pain 07/25/2012   Janna Arch, PT, DPT   01/20/2019, 11:23 AM  [place] MAIN [facility] SERVICES [address] [place], [place], [address] Phone: [phone]   Fax:  [phone]  Name: Lisa Williams MRN: [id number] Date of Birth: [birth year]

## 2019-01-22 ENCOUNTER — Ambulatory Visit: Payer: Medicare HMO

## 2019-01-22 ENCOUNTER — Other Ambulatory Visit: Payer: Self-pay

## 2019-01-22 DIAGNOSIS — R2681 Unsteadiness on feet: Secondary | ICD-10-CM

## 2019-01-22 DIAGNOSIS — M6281 Muscle weakness (generalized): Secondary | ICD-10-CM

## 2019-01-22 DIAGNOSIS — R2689 Other abnormalities of gait and mobility: Secondary | ICD-10-CM

## 2019-01-22 NOTE — Therapy (Signed)
[place] MAIN [facility] SERVICES [address] [place], [place], [address] Phone: [phone]   Fax:  [phone]  Physical Therapy Treatment  Patient Details  Name: Lisa Williams MRN: [id number] Date of Birth: [birth year] Referring Provider (PT): Gurney Maxin, MD   Encounter Date: 01/22/2019  PT End of Session - 01/22/19 1255    Visit Number  115    Number of Visits  129    Date for PT Re-Evaluation  03/12/19    Authorization Type  5/10 with PN 01/01/19    PT Start Time  1015    PT Stop Time  1058    PT Time Calculation (min)  43 min    Equipment Utilized During Treatment  Gait belt    Activity Tolerance  Patient tolerated treatment well;Other (comment)   heat intolerance   Behavior During Therapy  WFL for tasks assessed/performed       Past Medical History:  Diagnosis Date  . Abdominal pain, right upper quadrant   . Back pain   . Calculus of kidney 12/09/2013  . Chronic back pain    unspecified  . Chronic left shoulder pain 07/19/2015  . Functional disorder of bladder    other  . Galactorrhea 11/26/2014   Chronic   . Hereditary and idiopathic neuropathy 08/19/2013  . HPV test positive   . Hypercholesteremia 08/19/2013  . Incomplete bladder emptying   . Microscopic hematuria   . MS (multiple sclerosis) ([place])   . Muscle spasticity 05/21/2014  . Nonspecific findings on examination of urine    other  . Osteopenia   . Status post laparoscopic supracervical hysterectomy 11/26/2014  . Tobacco user 11/26/2014  . Wrist fracture     Past Surgical History:  Procedure Laterality Date  . bilateral tubal ligation  1996  . BREAST CYST EXCISION Left 2002  . KNEE SURGERY     right  . LAPAROSCOPIC SUPRACERVICAL HYSTERECTOMY  08/05/2013  . ORIF WRIST FRACTURE Left 01/17/2017   Procedure: OPEN REDUCTION INTERNAL FIXATION (ORIF) WRIST FRACTURE;  Surgeon: Lovell Sheehan, MD;  Location: [facility] ORS;  Service: Orthopedics;  Laterality: Left;   . TUBAL LIGATION Bilateral   . VAGINAL HYSTERECTOMY  03/2006    There were no vitals filed for this visit.  Subjective Assessment - 01/22/19 1254    Subjective  Patient reports she is doing well. Has been compliant with HEP. No falls or LOB since last session.    Pertinent History  Pt is a 58 y/o F who presents with BLE weakness and difficulty ambulating due to h/o MS that was diagnosed in 1995. Pt denies any pain associated. Pt was admitted to the hospital in November 2018 due to MS exacerbation. Nov-Dec to HHPT-->[facility] until end of Jan. After rehab the pt was able to ambulate up to 100 ft with the RW. Pt has Bil AFO, her therapist at [place] thinks she needs a new AFO as her feet still drag when she ambulates, pt reports HHPT mentioned a spring AFO. Pt has custom AFOs that were originally made 4 years ago. Goals: improve balance, to be able to get into the shower with her tub bench without assist, to be able to get up 2 steps at sister's home with railings on both sides but too far apart to reach both sides, to be able to drive adapted car. Pt is staying at other sister's house with one step to get inside and currently picking up L leg with UEs and  then stepping in. Pt steps down leading with LLE. Pt wants to work on walking side to side as this is challenging and she had not yet progressed to this at rehab. Pt able to get in/out the car with assist only to bring BLEs into the car. Hoping to drive by June or July with adapted car (already has one). Pt needs assist bringing LEs into and out of hospital bed. Pt able to perform sit>stand without assist from WC with a pillow on the seat for a boost. Pt denies any falls in the past 6 months. Pt has been increasing her time alone at home up to a few hours at a time to become more independent. Pt has both manual and power WC. Uses power WC at home, uses manual WC out in community. Pt able to self propel manual WC. Pt needs assist putting pants around  ankles but she can pull them up. Pt can put on her shoes but needs help getting her leg up to do so. Pt able to tie her shoelaces on her own. Pt has a tub/shower unit at home with a tub bench. No grab bars in the shower. Has hand held shower head. Pt currently taking a sponge bath. Pt has a regular height toilet with a BSC over top. Pt with h/o L wrist fx with no precautions, pt wore her soft brace to evaluation for comfort but reports her wrist has healed and is doing well following OT.     Currently in Pain?  No/denies           With RW Standing weight shift with RW: focus on gluteal and abdominal activation to activate LE's. x12 each LE Standing step 10x each leg, , cueing for upright posture with abdominal activation Improved step length each LE with improved foot clearance.  Rocking standing steps / for forfoot initial contact 10x each side  Ambulation 55ft with no dragging of L foot. Able to perform with good foot clearance, weight shift and step length.  Wheelchair follow with CGA.  head turns added for difficulty    seated   2.5lb bar -row seated 15x with focus on scapular squeeze  -diagonal row for single scapular retraction/depression 12x each side   - semicircle with arms at 90 degrees flexion rotating side to side 10x    Seated hamstring stretch 60 seconds each side.  Seated modified LAQ 10x ; last two of RLE requires mod A.  Seated gluteal squeezes 10x 5 second holds Red theraband hip abduction 10x  upright posture arms across chest, knee /hip flexion isometric marching 60 seconds    Seated rest breaks and use of ice pack utilized to keep body temperature at functional range to reduce MS exacerbation.      Pt educated throughout session about proper posture and technique with exercises. Improved exercise technique, movement at target joints, use of target muscles after min to mod verbal, visual, tactile cues.                      PT Education - 01/22/19  1255    Education provided  Yes    Education Details  exercise technique, body mechanics    Person(s) Educated  Patient    Methods  Explanation;Tactile cues;Demonstration;Verbal cues    Comprehension  Verbalized understanding;Returned demonstration;Verbal cues required;Tactile cues required       PT Short Term Goals - 01/15/19 1241      PT SHORT TERM GOAL #1     Title  Patient will be compliant with HEP 4-5 days/week for improved carryover between sessions.     Baseline  HEP compliancy between sessions 6/17 compliant    Time  2    Period  Weeks    Status  Achieved    Target Date  10/15/18      PT SHORT TERM GOAL #2   Title  Patient will report no falls/LOB to indicate improved stability with mobility     Baseline  6/17: no falls, one episode of instability 7/20: no falls    Time  2    Period  Weeks    Status  Achieved    Target Date  11/06/18        PT Long Term Goals - 01/15/19 0001      PT LONG TERM GOAL #1   Title  Patient will perform TUG in 40 seconds or less with walker to improve safe negotiation of environment.     Baseline  5/26: 61 seconds 6/17: 60 seconds 7/20: 59 seconds 8/26:  55 seconds    Time  8    Period  Weeks    Status  Partially Met    Target Date  03/12/19      PT LONG TERM GOAL #2   Title  Pt's 15mT will improve to at least 0.5 m/s for improved safety ambulating in home    Baseline  0.14 m/s; 5/15: .253m  7/10: .36 m/s  8/20: .42 m/s  9/26: 0.2662m10/14: .26 m/s improved hip and knee clearance 11/15: 0.36m71mmproved hip and knee clearance; 12/5: .29 m/s improved step length and forward moment: decreased foot drag. 05/09/2018: 0.21 m/s 2/6: .237 m/s 5/26: .23 m/s 6/17: .24 m/s  7/20: 0.26 m/s 8/26: 0.26 m/s    Time  8    Period  Weeks    Status  Partially Met    Target Date  03/12/19      PT LONG TERM GOAL #3   Title  Pt's Bil hip F strength will improve to at least 2+/5 BLEs for improved gait mechanics and safety    Baseline  L: 2/5, R: 1/5  7/10: 2/5 bilaterally  8/20: 2/5 bilat 9/26: 2/5 bilat 10/14: 2/5 bilaterally ,improved hip flexor muscle activation 2/5: improved hip flexor and quad activation: 12/5: 2/5 ; 05/09/2018: 2/5 2/6: 3/5 knee, 2 to 2+/5 hip 5/26: 2-/5 hips 3/5 knees 6/17: 2-/5 hips 3/5 knees    Time  8    Period  Weeks    Status  Partially Met    Target Date  03/12/19      PT LONG TERM GOAL #4   Title  Pt's ABC Scale will improve to at least 40% to demonstrate improved balance    Baseline  28.13% 5/15; 24.4% 7/10: 23.4% 8/20: 22. 2% 9/26: 25.3% 10/14: 26.5% 11/15: 24.38% 12/5: 26%; 05/09/2018: 29% 2/6: 23.75% 526: 30% 6/17: 26.8%  7/2: 28% 8/26: 30.3%    Time  8    Period  Weeks    Status  Partially Met    Target Date  03/12/19      PT LONG TERM GOAL #5   Title  Patient will negotiate one step ~4 inches to enter house independently to increase functional independence in natural environment    Baseline  /20: unable to do 9/26: patient too fatigued/not feeling well to perform 10/14: unable to perform at this time 11/15: unable to perform at this time; 12/5: able to lift 1 inch;  05/09/2018: unable to clear 4" step, able to lift foot ~ 1in 5/26: has been unable to perform 6/17: able to lift foot 1.25 inch 8/26: has been able to step up onto 1.5 inch step in previous sessions    Time  8    Period  Weeks    Status  Partially Met    Target Date  03/12/19      PT LONG TERM GOAL #6   Title  Patient will negotiate 30 ft of changing surfaces outside such as pavement and parking lot to allow patient to walk into church.    Baseline  12/5: does not walk outside 2/24: unable to test due to rain 5/26: unable since COVID 6/17: raining 7/20 too hot to test 8/26: unable to walk outside due to heat and COVID    Time  8    Period  Weeks    Status  Deferred    Target Date  03/12/19      PT LONG TERM GOAL #7   Title  Patient will stand 5 minutes to increase standing duration to allow patient to return to washing dishes and clothes.      Baseline  12/5: 56 seconds; 05/09/2018: 1 min 18 sec 2/6: 2 minutes last session 5/26: 1 minute 36 seconds limited by breathing 6/17: terminated due to abdomen pain  7/20 terminated due to overheating 8/26: able to stand 3 minutes at home    Time  8    Period  Weeks    Status  Partially Met    Target Date  03/12/19            Plan - 01/22/19 1257    Clinical Impression Statement  Patient is able to ambulate with head turns for the first time this session indicating improved mobility and capacity for dynamic stability. Patient fatigues with weighted core/trunk stability interventions. The patient will continue to benefit from skilled therapeutic intervention to address deficits in strength, mobility, balance, and function for improved overall QOL and safety    Rehab Potential  Good    PT Frequency  2x / week    PT Duration  8 weeks    PT Treatment/Interventions  ADLs/Self Care Home Management;Aquatic Therapy;Biofeedback;Cryotherapy;Electrical Stimulation;Iontophoresis 4mg/ml Dexamethasone;Traction;Moist Heat;Ultrasound;DME Instruction;Gait training;Stair training;Functional mobility training;Therapeutic activities;Therapeutic exercise;Balance training;Neuromuscular re-education;Patient/family education;Orthotic Fit/Training;Wheelchair mobility training;Manual techniques;Compression bandaging;Passive range of motion;Dry needling;Energy conservation;Taping;Splinting    PT Next Visit Plan  strength/balance    PT Home Exercise Plan  to initiate next session (please incorporate hip flexor and adductor stretches in HEP if possible)    Consulted and Agree with Plan of Care  Patient       Patient will benefit from skilled therapeutic intervention in order to improve the following deficits and impairments:  Abnormal gait, Decreased activity tolerance, Decreased balance, Decreased coordination, Decreased endurance, Decreased knowledge of use of DME, Decreased mobility, Decreased range of motion,  Decreased safety awareness, Decreased strength, Difficulty walking, Impaired perceived functional ability, Impaired sensation, Impaired tone, Impaired UE functional use, Improper body mechanics, Postural dysfunction  Visit Diagnosis: Muscle weakness (generalized)  Other abnormalities of gait and mobility  Unsteadiness on feet     Problem List Patient Active Problem List   Diagnosis Date Noted  . Abscess of female pelvis   . SVT (supraventricular tachycardia) ([facility])   . Radial styloid tenosynovitis 03/12/2018  . Wheelchair confinement 02/27/2018  . Localized osteoporosis with current pathological fracture with routine healing 01/19/2017  . Wrist fracture 01/16/2017  . Sprain of ankle   03/23/2016  . Closed fracture of lateral malleolus 03/16/2016  . Health care maintenance 01/24/2016  . Blood pressure elevated without history of HTN 10/25/2015  . Essential hypertension 10/25/2015  . Multiple sclerosis ([facility]) 10/02/2015  . Chronic left shoulder pain 07/19/2015  . Multiple sclerosis exacerbation ([facility]) 07/14/2015  . MS (multiple sclerosis) ([facility]) 11/26/2014  . Increased body mass index 11/26/2014  . HPV test positive 11/26/2014  . Status post laparoscopic supracervical hysterectomy 11/26/2014  . Galactorrhea 11/26/2014  . Back ache 05/21/2014  . Adiposity 05/21/2014  . Disordered sleep 05/21/2014  . Muscle spasticity 05/21/2014  . Spasticity 05/21/2014  . Calculus of kidney 12/09/2013  . Renal colic 12/09/2013  . Hypercholesteremia 08/19/2013  . Hereditary and idiopathic neuropathy 08/19/2013  . Hypercholesterolemia without hypertriglyceridemia 08/19/2013  . Bladder infection, chronic 07/25/2012  . Disorder of bladder function 07/25/2012  . Incomplete bladder emptying 07/25/2012  . Microscopic hematuria 07/25/2012  . Right upper quadrant pain 07/25/2012    , PT, DPT   01/22/2019, 12:58 PM  [place]  [facility] [address] [place], [place], [address] Phone: [phone]   Fax:  [phone]  Name: Lisa Williams MRN: [id number] Date of Birth: 05/27/1960   

## 2019-01-27 ENCOUNTER — Ambulatory Visit: Payer: Medicare HMO

## 2019-01-27 ENCOUNTER — Other Ambulatory Visit: Payer: Self-pay

## 2019-01-27 DIAGNOSIS — R2689 Other abnormalities of gait and mobility: Secondary | ICD-10-CM

## 2019-01-27 DIAGNOSIS — R2681 Unsteadiness on feet: Secondary | ICD-10-CM

## 2019-01-27 DIAGNOSIS — M6281 Muscle weakness (generalized): Secondary | ICD-10-CM

## 2019-01-27 NOTE — Therapy (Signed)
[place] MAIN [facility] SERVICES [address] [place], [place], [address] Phone: [phone]   Fax:  [phone]  Physical Therapy Treatment  Patient Details  Name: Lisa Williams MRN: [id number] Date of Birth: 10-27-1960 Referring Provider (PT): Gurney Maxin, MD   Encounter Date: 01/27/2019  PT End of Session - 01/27/19 1245    Visit Number  [id number]    Number of Visits  129    Date for PT Re-Evaluation  03/12/19    Authorization Type  6/10 with PN 01/01/19    PT Start Time  1014    PT Stop Time  1057    PT Time Calculation (min)  43 min    Equipment Utilized During Treatment  Gait belt    Activity Tolerance  Patient tolerated treatment well;Other (comment)   heat intolerance   Behavior During Therapy  WFL for tasks assessed/performed       Past Medical History:  Diagnosis Date  . Abdominal pain, right upper quadrant   . Back pain   . Calculus of kidney 12/09/2013  . Chronic back pain    unspecified  . Chronic left shoulder pain 07/19/2015  . Functional disorder of bladder    other  . Galactorrhea 11/26/2014   Chronic   . Hereditary and idiopathic neuropathy 08/19/2013  . HPV test positive   . Hypercholesteremia 08/19/2013  . Incomplete bladder emptying   . Microscopic hematuria   . MS (multiple sclerosis) ([place])   . Muscle spasticity 05/21/2014  . Nonspecific findings on examination of urine    other  . Osteopenia   . Status post laparoscopic supracervical hysterectomy 11/26/2014  . Tobacco user 11/26/2014  . Wrist fracture     Past Surgical History:  Procedure Laterality Date  . bilateral tubal ligation  1996  . BREAST CYST EXCISION Left 2002  . KNEE SURGERY     right  . LAPAROSCOPIC SUPRACERVICAL HYSTERECTOMY  08/05/2013  . ORIF WRIST FRACTURE Left 01/17/2017   Procedure: OPEN REDUCTION INTERNAL FIXATION (ORIF) WRIST FRACTURE;  Surgeon: Lovell Sheehan, MD;  Location: [facility] ORS;  Service: Orthopedics;  Laterality: Left;   . TUBAL LIGATION Bilateral   . VAGINAL HYSTERECTOMY  03/2006    There were no vitals filed for this visit.  Subjective Assessment - 01/27/19 1244    Subjective  Patient presents with no pain or LOB since last session. Was able to go shopping over the weekend. HEP compliant    Pertinent History  Pt is a 58 y/o F who presents with BLE weakness and difficulty ambulating due to h/o MS that was diagnosed in 1995. Pt denies any pain associated. Pt was admitted to the hospital in November 2018 due to MS exacerbation. Nov-Dec to HHPT-->[facility] until end of Jan. After rehab the pt was able to ambulate up to 100 ft with the RW. Pt has Bil AFO, her therapist at [facility] thinks she needs a new AFO as her feet still drag when she ambulates, pt reports HHPT mentioned a spring AFO. Pt has custom AFOs that were originally made 4 years ago. Goals: improve balance, to be able to get into the shower with her tub bench without assist, to be able to get up 2 steps at sister's home with railings on both sides but too far apart to reach both sides, to be able to drive adapted car. Pt is staying at other sister's house with one step to get inside and currently picking up L leg with  UEs and then stepping in. Pt steps down leading with LLE. Pt wants to work on walking side to side as this is challenging and she had not yet progressed to this at rehab. Pt able to get in/out the car with assist only to bring BLEs into the car. Hoping to drive by June or July with adapted car (already has one). Pt needs assist bringing LEs into and out of hospital bed. Pt able to perform sit>stand without assist from [facility] with a pillow on the seat for a boost. Pt denies any falls in the past 6 months. Pt has been increasing her time alone at home up to a few hours at a time to become more independent. Pt has both manual and power WC. Uses power WC at home, uses manual WC out in community. Pt able to self propel manual WC. Pt needs assist putting  pants around ankles but she can pull them up. Pt can put on her shoes but needs help getting her leg up to do so. Pt able to tie her shoelaces on her own. Pt has a tub/shower unit at home with a tub bench. No grab bars in the shower. Has hand held shower head. Pt currently taking a sponge bath. Pt has a regular height toilet with a BSC over top. Pt with h/o L wrist fx with no precautions, pt wore her soft brace to evaluation for comfort but reports her wrist has healed and is doing well following OT.     Currently in Pain?  No/denies           Treatment  standing weight shift with step 9x each LE, BUE support for carryover to ambulatory mechanics.   Standing hip flexion/knee flexion to band in // bars to promote visual/tactile cue for improved muscle recruitment x 15 each LE  Standing standing hip extension 5x each side BUE support  Seated hamstring stretch with overpressure 60 seconds x 2    seated adduction ballsqueeze 15x 3 second holds  Weighted ball (2000Gr) half circle arc of motion 10x; cueing for upright posture   Weighted ball (2000 Gr) chest press 15x; cueing for scapular depression for proper muscle recruitment  Weighted ball (2000 Gr) overhead press 12x, very challenging to maintain correct elbow alignment.    Seated rest breaks and use of ice pack utilized to keep body temperature at functional range to reduce MS exacerbation.      Pt educated throughout session about proper posture and technique with exercises. Improved exercise technique, movement at target joints, use of target muscles after min to mod verbal, visual, tactile cues.             PT Education - 01/27/19 1245    Education provided  Yes    Education Details  exercise technique, body mechanics,    Person(s) Educated  Patient    Methods  Explanation;Tactile cues;Demonstration;Verbal cues    Comprehension  Verbalized understanding;Returned demonstration;Verbal cues required;Tactile cues required        PT Short Term Goals - 01/15/19 1241      PT SHORT TERM GOAL #1   Title  Patient will be compliant with HEP 4-5 days/week for improved carryover between sessions.     Baseline  HEP compliancy between sessions 6/17 compliant    Time  2    Period  Weeks    Status  Achieved    Target Date  10/15/18      PT SHORT TERM GOAL #2   Title  Patient will report no falls/LOB to indicate improved stability with mobility     Baseline  6/17: no falls, one episode of instability 7/20: no falls    Time  2    Period  Weeks    Status  Achieved    Target Date  11/06/18        PT Long Term Goals - 01/15/19 0001      PT LONG TERM GOAL #1   Title  Patient will perform TUG in 40 seconds or less with walker to improve safe negotiation of environment.     Baseline  5/26: 61 seconds 6/17: 60 seconds 7/20: 59 seconds 8/26:  55 seconds    Time  8    Period  Weeks    Status  Partially Met    Target Date  03/12/19      PT LONG TERM GOAL #2   Title  Pt's 67mT will improve to at least 0.5 m/s for improved safety ambulating in home    Baseline  0.14 m/s; 5/15: .245m  7/10: .36 m/s  8/20: .42 m/s  9/26: 0.2685m10/14: .26 m/s improved hip and knee clearance 11/15: 0.80m63mmproved hip and knee clearance; 12/5: .29 m/s improved step length and forward moment: decreased foot drag. 05/09/2018: 0.21 m/s 2/6: .237 m/s 5/26: .23 m/s 6/17: .24 m/s  7/20: 0.26 m/s 8/26: 0.26 m/s    Time  8    Period  Weeks    Status  Partially Met    Target Date  03/12/19      PT LONG TERM GOAL #3   Title  Pt's Bil hip F strength will improve to at least 2+/5 BLEs for improved gait mechanics and safety    Baseline  L: 2/5, R: 1/5 7/10: 2/5 bilaterally  8/20: 2/5 bilat 9/26: 2/5 bilat 10/14: 2/5 bilaterally ,improved hip flexor muscle activation 2/5: improved hip flexor and quad activation: 12/5: 2/5 ; 05/09/2018: 2/5 2/6: 3/5 knee, 2 to 2+/5 hip 5/26: 2-/5 hips 3/5 knees 6/17: 2-/5 hips 3/5 knees    Time  8    Period  Weeks     Status  Partially Met    Target Date  03/12/19      PT LONG TERM GOAL #4   Title  Pt's ABC Scale will improve to at least 40% to demonstrate improved balance    Baseline  28.13% 5/15; 24.4% 7/10: 23.4% 8/20: 22. 2% 9/26: 25.3% 10/14: 26.5% 11/15: 24.38% 12/5: 26%; 05/09/2018: 29% 2/6: 23.75% 526: 30% 6/17: 26.8%  7/2: 28% 8/26: 30.3%    Time  8    Period  Weeks    Status  Partially Met    Target Date  03/12/19      PT LONG TERM GOAL #5   Title  Patient will negotiate one step ~4 inches to enter house independently to increase functional independence in natural environment    Baseline  /20: unable to do 9/26: patient too fatigued/not feeling well to perform 10/14: unable to perform at this time 11/15: unable to perform at this time; 12/5: able to lift 1 inch; 05/09/2018: unable to clear 4" step, able to lift foot ~ 1in 5/26: has been unable to perform 6/17: able to lift foot 1.25 inch 8/26: has been able to step up onto 1.5 inch step in previous sessions    Time  8    Period  Weeks    Status  Partially Met    Target Date  03/12/19  PT LONG TERM GOAL #6   Title  Patient will negotiate 30 ft of changing surfaces outside such as pavement and parking lot to allow patient to walk into church.    Baseline  12/5: does not walk outside 2/24: unable to test due to rain 5/26: unable since COVID 6/17: raining 7/20 too hot to test 8/26: unable to walk outside due to heat and COVID    Time  8    Period  Weeks    Status  Deferred    Target Date  03/12/19      PT LONG TERM GOAL #7   Title  Patient will stand 5 minutes to increase standing duration to allow patient to return to washing dishes and clothes.     Baseline  12/5: 56 seconds; 05/09/2018: 1 min 18 sec 2/6: 2 minutes last session 5/26: 1 minute 36 seconds limited by breathing 6/17: terminated due to abdomen pain  7/20 terminated due to overheating 8/26: able to stand 3 minutes at home    Time  8    Period  Weeks    Status  Partially Met     Target Date  03/12/19            Plan - 01/27/19 1258    Clinical Impression Statement  Patient presents to physical therapy with good motivation to todays session with decreased need for seated rest breaks between standing interventions. Improved quality of recruitment of LE musculature for hip/knee flexion with good coordination to target line. The patient will continue to benefit from skilled therapeutic intervention to address deficits in strength, mobility, balance, and function for improved overall QOL and safety    Rehab Potential  Good    PT Frequency  2x / week    PT Duration  8 weeks    PT Treatment/Interventions  ADLs/Self Care Home Management;Aquatic Therapy;Biofeedback;Cryotherapy;Electrical Stimulation;Iontophoresis 93m/ml Dexamethasone;Traction;Moist Heat;Ultrasound;DME Instruction;Gait training;Stair training;Functional mobility training;Therapeutic activities;Therapeutic exercise;Balance training;Neuromuscular re-education;Patient/family education;Orthotic Fit/Training;Wheelchair mobility training;Manual techniques;Compression bandaging;Passive range of motion;Dry needling;Energy conservation;Taping;Splinting    PT Next Visit Plan  strength/balance    PT Home Exercise Plan  to initiate next session (please incorporate hip flexor and adductor stretches in HEP if possible)    Consulted and Agree with Plan of Care  Patient       Patient will benefit from skilled therapeutic intervention in order to improve the following deficits and impairments:  Abnormal gait, Decreased activity tolerance, Decreased balance, Decreased coordination, Decreased endurance, Decreased knowledge of use of DME, Decreased mobility, Decreased range of motion, Decreased safety awareness, Decreased strength, Difficulty walking, Impaired perceived functional ability, Impaired sensation, Impaired tone, Impaired UE functional use, Improper body mechanics, Postural dysfunction  Visit Diagnosis: Muscle weakness  (generalized)  Other abnormalities of gait and mobility  Unsteadiness on feet     Problem List Patient Active Problem List   Diagnosis Date Noted  . Abscess of female pelvis   . SVT (supraventricular tachycardia) ([place]   . Radial styloid tenosynovitis 03/12/2018  . Wheelchair confinement 02/27/2018  . Localized osteoporosis with current pathological fracture with routine healing 01/19/2017  . Wrist fracture 01/16/2017  . Sprain of ankle 03/23/2016  . Closed fracture of lateral malleolus 03/16/2016  . Health care maintenance 01/24/2016  . Blood pressure elevated without history of HTN 10/25/2015  . Essential hypertension 10/25/2015  . Multiple sclerosis ([place] 10/02/2015  . Chronic left shoulder pain 07/19/2015  . Multiple sclerosis exacerbation ([place] 07/14/2015  . MS (multiple sclerosis) ([place] 11/26/2014  . Increased body mass  index 11/26/2014  . HPV test positive 11/26/2014  . Status post laparoscopic supracervical hysterectomy 11/26/2014  . Galactorrhea 11/26/2014  . Back ache 05/21/2014  . Adiposity 05/21/2014  . Disordered sleep 05/21/2014  . Muscle spasticity 05/21/2014  . Spasticity 05/21/2014  . Calculus of kidney 12/09/2013  . Renal colic 32/99/2426  . Hypercholesteremia 08/19/2013  . Hereditary and idiopathic neuropathy 08/19/2013  . Hypercholesterolemia without hypertriglyceridemia 08/19/2013  . Bladder infection, chronic 07/25/2012  . Disorder of bladder function 07/25/2012  . Incomplete bladder emptying 07/25/2012  . Microscopic hematuria 07/25/2012  . Right upper quadrant pain 07/25/2012   Janna Arch, PT, DPT   01/27/2019, 12:59 PM  [place] MAIN [facility] SERVICES [address] [place], [place], [address] Phone: [phone]   Fax:  [phone]  Name: Lisa Williams MRN: [id number] Date of Birth: 02/27/1961

## 2019-01-29 ENCOUNTER — Ambulatory Visit: Payer: Medicare HMO

## 2019-02-03 ENCOUNTER — Ambulatory Visit: Payer: Medicare HMO

## 2019-02-05 ENCOUNTER — Ambulatory Visit: Payer: Medicare HMO

## 2019-02-05 ENCOUNTER — Other Ambulatory Visit: Payer: Self-pay

## 2019-02-05 DIAGNOSIS — M6281 Muscle weakness (generalized): Secondary | ICD-10-CM | POA: Diagnosis not present

## 2019-02-05 DIAGNOSIS — R2681 Unsteadiness on feet: Secondary | ICD-10-CM

## 2019-02-05 DIAGNOSIS — R2689 Other abnormalities of gait and mobility: Secondary | ICD-10-CM

## 2019-02-05 NOTE — Therapy (Signed)
[place] MAIN [facility] SERVICES [address] [place], [place], [address] Phone: [phone]   Fax:  [phone]  Physical Therapy Treatment  Patient Details  Name: Lisa Williams MRN: [id number] Date of Birth: [birth year] Referring Provider (PT): Gurney Maxin, MD   Encounter Date: 02/05/2019  PT End of Session - 02/05/19 1230    Visit Number  [id number]    Number of Visits  129    Date for PT Re-Evaluation  03/12/19    Authorization Type  7/10 with PN 01/01/19    PT Start Time  1015    PT Stop Time  1058    PT Time Calculation (min)  43 min    Equipment Utilized During Treatment  Gait belt    Activity Tolerance  Patient tolerated treatment well;Other (comment)   heat intolerance   Behavior During Therapy  WFL for tasks assessed/performed       Past Medical History:  Diagnosis Date  . Abdominal pain, right upper quadrant   . Back pain   . Calculus of kidney 12/09/2013  . Chronic back pain    unspecified  . Chronic left shoulder pain 07/19/2015  . Functional disorder of bladder    other  . Galactorrhea 11/26/2014   Chronic   . Hereditary and idiopathic neuropathy 08/19/2013  . HPV test positive   . Hypercholesteremia 08/19/2013  . Incomplete bladder emptying   . Microscopic hematuria   . MS (multiple sclerosis) ([place])   . Muscle spasticity 05/21/2014  . Nonspecific findings on examination of urine    other  . Osteopenia   . Status post laparoscopic supracervical hysterectomy 11/26/2014  . Tobacco user 11/26/2014  . Wrist fracture     Past Surgical History:  Procedure Laterality Date  . bilateral tubal ligation  1996  . BREAST CYST EXCISION Left 2002  . KNEE SURGERY     right  . LAPAROSCOPIC SUPRACERVICAL HYSTERECTOMY  08/05/2013  . ORIF WRIST FRACTURE Left 01/17/2017   Procedure: OPEN REDUCTION INTERNAL FIXATION (ORIF) WRIST FRACTURE;  Surgeon: Lovell Sheehan, MD;  Location: [facility] ORS;  Service: Orthopedics;  Laterality: Left;   . TUBAL LIGATION Bilateral   . VAGINAL HYSTERECTOMY  03/2006    There were no vitals filed for this visit.  Subjective Assessment - 02/05/19 1229    Subjective  Patient reports compliance with HEP. Reports she is feeling well due to the temperature.    Pertinent History  Pt is a 58 y/o F who presents with BLE weakness and difficulty ambulating due to h/o MS that was diagnosed in 1995. Pt denies any pain associated. Pt was admitted to the hospital in November 2018 due to MS exacerbation. Nov-Dec to HHPT-->[facility] until end of Jan. After rehab the pt was able to ambulate up to 100 ft with the RW. Pt has Bil AFO, her therapist at [facility] thinks she needs a new AFO as her feet still drag when she ambulates, pt reports HHPT mentioned a spring AFO. Pt has custom AFOs that were originally made 4 years ago. Goals: improve balance, to be able to get into the shower with her tub bench without assist, to be able to get up 2 steps at sister's home with railings on both sides but too far apart to reach both sides, to be able to drive adapted car. Pt is staying at other sister's house with one step to get inside and currently picking up L leg with UEs and then stepping in. Pt  steps down leading with LLE. Pt wants to work on walking side to side as this is challenging and she had not yet progressed to this at rehab. Pt able to get in/out the car with assist only to bring BLEs into the car. Hoping to drive by June or July with adapted car (already has one). Pt needs assist bringing LEs into and out of hospital bed. Pt able to perform sit>stand without assist from [facility] with a pillow on the seat for a boost. Pt denies any falls in the past 6 months. Pt has been increasing her time alone at home up to a few hours at a time to become more independent. Pt has both manual and power WC. Uses power WC at home, uses manual WC out in community. Pt able to self propel manual WC. Pt needs assist putting pants around ankles but  she can pull them up. Pt can put on her shoes but needs help getting her leg up to do so. Pt able to tie her shoelaces on her own. Pt has a tub/shower unit at home with a tub bench. No grab bars in the shower. Has hand held shower head. Pt currently taking a sponge bath. Pt has a regular height toilet with a BSC over top. Pt with h/o L wrist fx with no precautions, pt wore her soft brace to evaluation for comfort but reports her wrist has healed and is doing well following OT.     Currently in Pain?  No/denies              in // bars  Standing weight shifts; cues for hip shift ; BUE , cueing for hip movement rather than shoulderfor optimal carryover for step negotiation/ambulation with foot clearance   In // bars: large step forward 8x each LE. BUE support   Band across // bars mid thigh height: tactile/visual cue for knee/hip flexion for foot clearance, knee band 10x each LE.  Ambulate in // bars with focus on knee flexion, weight shift, and step length 2x length of // bars.   Standing hip extension 5x each LE; very challenging and fatiguing    Sit to stand technique with UE support, decreased reliance upon UE's with improved eccentric control, no "plopping". Multiple performances throughout session    seated   Cross body jabs with increasing difficulty and range, increasing challenge to L, R, duck for core stability and postural reactions. -Jab L , R duck for cross body coordination and postural control/righting reactions 10x -Jab R, L duck for cross body coordination and postural control/righting reactions 10x -Cross body elbowfor abdominal strengthening and postural challenge x 60 seconds -cross R/L upper cut L R 10x with cross body coordination and postural strengthening -cross R/L ; elbow L/R 10x with cross body coordination challenge and lateral abdomen activation.   Scapular retractions 10x    Seated rest breaks and use of ice pack utilized to keep body temperature  at functional range to reduce MS exacerbation.  Pt educated throughout session about proper posture and technique with exercises. Improved exercise technique, movement at target joints, use of target muscles after min to mod verbal, visual, tactile cues.                       PT Education - 02/05/19 1230    Education provided  Yes    Education Details  exercise technique, body mechanics    Person(s) Educated  Patient    Methods  Explanation;Demonstration;Tactile cues;Verbal cues    Comprehension  Verbalized understanding;Returned demonstration;Verbal cues required;Tactile cues required       PT Short Term Goals - 01/15/19 1241      PT SHORT TERM GOAL #1   Title  Patient will be compliant with HEP 4-5 days/week for improved carryover between sessions.     Baseline  HEP compliancy between sessions 6/17 compliant    Time  2    Period  Weeks    Status  Achieved    Target Date  10/15/18      PT SHORT TERM GOAL #2   Title  Patient will report no falls/LOB to indicate improved stability with mobility     Baseline  6/17: no falls, one episode of instability 7/20: no falls    Time  2    Period  Weeks    Status  Achieved    Target Date  11/06/18        PT Long Term Goals - 01/15/19 0001      PT LONG TERM GOAL #1   Title  Patient will perform TUG in 40 seconds or less with walker to improve safe negotiation of environment.     Baseline  5/26: 61 seconds 6/17: 60 seconds 7/20: 59 seconds 8/26:  55 seconds    Time  8    Period  Weeks    Status  Partially Met    Target Date  03/12/19      PT LONG TERM GOAL #2   Title  Pt's 81mT will improve to at least 0.5 m/s for improved safety ambulating in home    Baseline  0.14 m/s; 5/15: .257m  7/10: .36 m/s  8/20: .42 m/s  9/26: 0.2667m10/14: .26 m/s improved hip and knee clearance 11/15: 0.109m90mmproved hip and knee clearance; 12/5: .29 m/s improved step length and forward moment: decreased foot drag. 05/09/2018:  0.21 m/s 2/6: .237 m/s 5/26: .23 m/s 6/17: .24 m/s  7/20: 0.26 m/s 8/26: 0.26 m/s    Time  8    Period  Weeks    Status  Partially Met    Target Date  03/12/19      PT LONG TERM GOAL #3   Title  Pt's Bil hip F strength will improve to at least 2+/5 BLEs for improved gait mechanics and safety    Baseline  L: 2/5, R: 1/5 7/10: 2/5 bilaterally  8/20: 2/5 bilat 9/26: 2/5 bilat 10/14: 2/5 bilaterally ,improved hip flexor muscle activation 2/5: improved hip flexor and quad activation: 12/5: 2/5 ; 05/09/2018: 2/5 2/6: 3/5 knee, 2 to 2+/5 hip 5/26: 2-/5 hips 3/5 knees 6/17: 2-/5 hips 3/5 knees    Time  8    Period  Weeks    Status  Partially Met    Target Date  03/12/19      PT LONG TERM GOAL #4   Title  Pt's ABC Scale will improve to at least 40% to demonstrate improved balance    Baseline  28.13% 5/15; 24.4% 7/10: 23.4% 8/20: 22. 2% 9/26: 25.3% 10/14: 26.5% 11/15: 24.38% 12/5: 26%; 05/09/2018: 29% 2/6: 23.75% 526: 30% 6/17: 26.8%  7/2: 28% 8/26: 30.3%    Time  8    Period  Weeks    Status  Partially Met    Target Date  03/12/19      PT LONG TERM GOAL #5   Title  Patient will negotiate one step ~4 inches to enter house independently to increase functional independence  in natural environment    Baseline  /20: unable to do 9/26: patient too fatigued/not feeling well to perform 10/14: unable to perform at this time 11/15: unable to perform at this time; 12/5: able to lift 1 inch; 05/09/2018: unable to clear 4" step, able to lift foot ~ 1in 5/26: has been unable to perform 6/17: able to lift foot 1.25 inch 8/26: has been able to step up onto 1.5 inch step in previous sessions    Time  8    Period  Weeks    Status  Partially Met    Target Date  03/12/19      PT LONG TERM GOAL #6   Title  Patient will negotiate 30 ft of changing surfaces outside such as pavement and parking lot to allow patient to walk into church.    Baseline  12/5: does not walk outside 2/24: unable to test due to rain 5/26: unable  since COVID 6/17: raining 7/20 too hot to test 8/26: unable to walk outside due to heat and COVID    Time  8    Period  Weeks    Status  Deferred    Target Date  03/12/19      PT LONG TERM GOAL #7   Title  Patient will stand 5 minutes to increase standing duration to allow patient to return to washing dishes and clothes.     Baseline  12/5: 56 seconds; 05/09/2018: 1 min 18 sec 2/6: 2 minutes last session 5/26: 1 minute 36 seconds limited by breathing 6/17: terminated due to abdomen pain  7/20 terminated due to overheating 8/26: able to stand 3 minutes at home    Time  8    Period  Weeks    Status  Partially Met    Target Date  03/12/19            Plan - 02/05/19 1235    Clinical Impression Statement  Patient presents with good energy levels and motivation to physical therapy session. Is demonstrating improved ability to focalize knee flexion for gait mechanics within // bars. Patient fatigues in standing single limb tasks with heavy reliance upon arms. The patient will continue to benefit from skilled therapeutic intervention to address deficits in strength, mobility, balance, and function for improved overall QOL and safety    Rehab Potential  Good    PT Frequency  2x / week    PT Duration  8 weeks    PT Treatment/Interventions  ADLs/Self Care Home Management;Aquatic Therapy;Biofeedback;Cryotherapy;Electrical Stimulation;Iontophoresis '4mg'$ /ml Dexamethasone;Traction;Moist Heat;Ultrasound;DME Instruction;Gait training;Stair training;Functional mobility training;Therapeutic activities;Therapeutic exercise;Balance training;Neuromuscular re-education;Patient/family education;Orthotic Fit/Training;Wheelchair mobility training;Manual techniques;Compression bandaging;Passive range of motion;Dry needling;Energy conservation;Taping;Splinting    PT Next Visit Plan  strength/balance    PT Home Exercise Plan  to initiate next session (please incorporate hip flexor and adductor stretches in HEP if  possible)    Consulted and Agree with Plan of Care  Patient       Patient will benefit from skilled therapeutic intervention in order to improve the following deficits and impairments:  Abnormal gait, Decreased activity tolerance, Decreased balance, Decreased coordination, Decreased endurance, Decreased knowledge of use of DME, Decreased mobility, Decreased range of motion, Decreased safety awareness, Decreased strength, Difficulty walking, Impaired perceived functional ability, Impaired sensation, Impaired tone, Impaired UE functional use, Improper body mechanics, Postural dysfunction  Visit Diagnosis: Muscle weakness (generalized)  Other abnormalities of gait and mobility  Unsteadiness on feet     Problem List Patient Active Problem List   Diagnosis  Date Noted  . Abscess of female pelvis   . SVT (supraventricular tachycardia) ([place])   . Radial styloid tenosynovitis 03/12/2018  . Wheelchair confinement 02/27/2018  . Localized osteoporosis with current pathological fracture with routine healing 01/19/2017  . Wrist fracture 01/16/2017  . Sprain of ankle 03/23/2016  . Closed fracture of lateral malleolus 03/16/2016  . Health care maintenance 01/24/2016  . Blood pressure elevated without history of HTN 10/25/2015  . Essential hypertension 10/25/2015  . Multiple sclerosis ([place]) 10/02/2015  . Chronic left shoulder pain 07/19/2015  . Multiple sclerosis exacerbation ([place]) 07/14/2015  . MS (multiple sclerosis) ([place]) 11/26/2014  . Increased body mass index 11/26/2014  . HPV test positive 11/26/2014  . Status post laparoscopic supracervical hysterectomy 11/26/2014  . Galactorrhea 11/26/2014  . Back ache 05/21/2014  . Adiposity 05/21/2014  . Disordered sleep 05/21/2014  . Muscle spasticity 05/21/2014  . Spasticity 05/21/2014  . Calculus of kidney 12/09/2013  . Renal colic 26/94/8546  . Hypercholesteremia 08/19/2013  . Hereditary and idiopathic neuropathy 08/19/2013  .  Hypercholesterolemia without hypertriglyceridemia 08/19/2013  . Bladder infection, chronic 07/25/2012  . Disorder of bladder function 07/25/2012  . Incomplete bladder emptying 07/25/2012  . Microscopic hematuria 07/25/2012  . Right upper quadrant pain 07/25/2012   Janna Arch, PT, DPT   02/05/2019, 12:36 PM  [place] MAIN [facility] SERVICES [address] [place], [place], [address] Phone: [phone]   Fax:  [phone]  Name: Shariya Gaster Huynh MRN: [id number] Date of Birth: 11-18-1960

## 2019-02-07 ENCOUNTER — Other Ambulatory Visit: Payer: Self-pay

## 2019-02-07 ENCOUNTER — Ambulatory Visit: Payer: Medicare HMO | Attending: Neurology

## 2019-02-07 DIAGNOSIS — M6281 Muscle weakness (generalized): Secondary | ICD-10-CM | POA: Diagnosis present

## 2019-02-07 DIAGNOSIS — R2689 Other abnormalities of gait and mobility: Secondary | ICD-10-CM | POA: Insufficient documentation

## 2019-02-07 DIAGNOSIS — R2681 Unsteadiness on feet: Secondary | ICD-10-CM | POA: Diagnosis present

## 2019-02-07 NOTE — Therapy (Addendum)
[place] MAIN [facility] SERVICES [address] [place], [place], [address] Phone: [phone]   Fax:  [phone]  Physical Therapy Treatment  Patient Details  Name: Lisa Williams MRN: [id number] Date of Birth: 11-09-1960 Referring Provider (PT): Gurney Maxin, MD   Encounter Date: 02/07/2019  PT End of Session - 02/07/19 1601    Visit Number  [id number]    Number of Visits  129    Date for PT Re-Evaluation  03/12/19    Authorization Type  8/10 with PN 01/01/19    PT Start Time  1000    PT Stop Time  1041    PT Time Calculation (min)  41 min    Equipment Utilized During Treatment  Gait belt    Activity Tolerance  Patient tolerated treatment well;Other (comment)   heat intolerance   Behavior During Therapy  WFL for tasks assessed/performed       Past Medical History:  Diagnosis Date  . Abdominal pain, right upper quadrant   . Back pain   . Calculus of kidney 12/09/2013  . Chronic back pain    unspecified  . Chronic left shoulder pain 07/19/2015  . Functional disorder of bladder    other  . Galactorrhea 11/26/2014   Chronic   . Hereditary and idiopathic neuropathy 08/19/2013  . HPV test positive   . Hypercholesteremia 08/19/2013  . Incomplete bladder emptying   . Microscopic hematuria   . MS (multiple sclerosis) ([place])   . Muscle spasticity 05/21/2014  . Nonspecific findings on examination of urine    other  . Osteopenia   . Status post laparoscopic supracervical hysterectomy 11/26/2014  . Tobacco user 11/26/2014  . Wrist fracture     Past Surgical History:  Procedure Laterality Date  . bilateral tubal ligation  1996  . BREAST CYST EXCISION Left 2002  . KNEE SURGERY     right  . LAPAROSCOPIC SUPRACERVICAL HYSTERECTOMY  08/05/2013  . ORIF WRIST FRACTURE Left 01/17/2017   Procedure: OPEN REDUCTION INTERNAL FIXATION (ORIF) WRIST FRACTURE;  Surgeon: Lovell Sheehan, MD;  Location: [facility] ORS;  Service: Orthopedics;  Laterality: Left;   . TUBAL LIGATION Bilateral   . VAGINAL HYSTERECTOMY  03/2006    There were no vitals filed for this visit.  Subjective Assessment - 02/07/19 1557    Subjective  Patient reports compliance with HEP, is going to [facility] after PT session. No falls or LOB since last session.    Pertinent History  Pt is a 58 y/o F who presents with BLE weakness and difficulty ambulating due to h/o MS that was diagnosed in 1995. Pt denies any pain associated. Pt was admitted to the hospital in November 2018 due to MS exacerbation. Nov-Dec to HHPT-->[facility] until end of Jan. After rehab the pt was able to ambulate up to 100 ft with the RW. Pt has Bil AFO, her therapist at [facility] thinks she needs a new AFO as her feet still drag when she ambulates, pt reports HHPT mentioned a spring AFO. Pt has custom AFOs that were originally made 4 years ago. Goals: improve balance, to be able to get into the shower with her tub bench without assist, to be able to get up 2 steps at sister's home with railings on both sides but too far apart to reach both sides, to be able to drive adapted car. Pt is staying at other sister's house with one step to get inside and currently picking up L leg with UEs  and then stepping in. Pt steps down leading with LLE. Pt wants to work on walking side to side as this is challenging and she had not yet progressed to this at rehab. Pt able to get in/out the car with assist only to bring BLEs into the car. Hoping to drive by June or July with adapted car (already has one). Pt needs assist bringing LEs into and out of hospital bed. Pt able to perform sit>stand without assist from [facility] with a pillow on the seat for a boost. Pt denies any falls in the past 6 months. Pt has been increasing her time alone at home up to a few hours at a time to become more independent. Pt has both manual and power WC. Uses power WC at home, uses manual WC out in community. Pt able to self propel manual WC. Pt needs assist  putting pants around ankles but she can pull them up. Pt can put on her shoes but needs help getting her leg up to do so. Pt able to tie her shoelaces on her own. Pt has a tub/shower unit at home with a tub bench. No grab bars in the shower. Has hand held shower head. Pt currently taking a sponge bath. Pt has a regular height toilet with a BSC over top. Pt with h/o L wrist fx with no precautions, pt wore her soft brace to evaluation for comfort but reports her wrist has healed and is doing well following OT.     Currently in Pain?  No/denies     Sit to stands:   5x sit to stand from wheelchair to RW, good eccentric/concentric control with no "plop" requires use of UE's noted adduction with fatigue,  5x sit to stand from wheelchair to RW with focus on  Abduction of LE's into neutral alignment with use of RTB around knees for optimal muscle recruitment   Standing with RW:  RTB on RW: hip/knee flexion towards band , 10x each side seated rest break between each RW: heel lift with posterior leg; 10x each LE , very challenging to maintain stability in semi tandem stance.   Seated:  RTB abduction 12x RTB marches (modified) isometric against band 10x 3 second each LE   Weighed ball (2000 gr) -half circle arc of motion from one knee to opp knee, 12x -chest press 12x  -overhead chest press, diagonal press (D2) 60 seconds.    Bathroom mobility: assist patient into bathroom due to difficulty with opening door. Patient able to ambulate with RW and lower pants/undergarments for voiding of bladder as well as raise them with Mod I and use of handicap rail in stall, Supervision for ambulation and transfer.    Seated rest breaks and use of ice pack utilized to keep body temperature at functional range to reduce MS exacerbation.  Pt educated throughout session about proper posture and technique with exercises. Improved exercise technique, movement at target joints, use of target muscles after min to mod  verbal, visual, tactile cues.                    PT Education - 02/07/19 1601    Education provided  Yes    Education Details  exercise technique, body mechanics    Person(s) Educated  Patient    Methods  Explanation;Demonstration;Tactile cues;Verbal cues    Comprehension  Verbalized understanding;Returned demonstration;Verbal cues required;Tactile cues required       PT Short Term Goals - 01/15/19 1241  PT SHORT TERM GOAL #1   Title  Patient will be compliant with HEP 4-5 days/week for improved carryover between sessions.     Baseline  HEP compliancy between sessions 6/17 compliant    Time  2    Period  Weeks    Status  Achieved    Target Date  10/15/18      PT SHORT TERM GOAL #2   Title  Patient will report no falls/LOB to indicate improved stability with mobility     Baseline  6/17: no falls, one episode of instability 7/20: no falls    Time  2    Period  Weeks    Status  Achieved    Target Date  11/06/18        PT Long Term Goals - 01/15/19 0001      PT LONG TERM GOAL #1   Title  Patient will perform TUG in 40 seconds or less with walker to improve safe negotiation of environment.     Baseline  5/26: 61 seconds 6/17: 60 seconds 7/20: 59 seconds 8/26:  55 seconds    Time  8    Period  Weeks    Status  Partially Met    Target Date  03/12/19      PT LONG TERM GOAL #2   Title  Pt's 61mT will improve to at least 0.5 m/s for improved safety ambulating in home    Baseline  0.14 m/s; 5/15: .260m  7/10: .36 m/s  8/20: .42 m/s  9/26: 0.2676m10/14: .26 m/s improved hip and knee clearance 11/15: 0.48m19mmproved hip and knee clearance; 12/5: .29 m/s improved step length and forward moment: decreased foot drag. 05/09/2018: 0.21 m/s 2/6: .237 m/s 5/26: .23 m/s 6/17: .24 m/s  7/20: 0.26 m/s 8/26: 0.26 m/s    Time  8    Period  Weeks    Status  Partially Met    Target Date  03/12/19      PT LONG TERM GOAL #3   Title  Pt's Bil hip F strength will improve  to at least 2+/5 BLEs for improved gait mechanics and safety    Baseline  L: 2/5, R: 1/5 7/10: 2/5 bilaterally  8/20: 2/5 bilat 9/26: 2/5 bilat 10/14: 2/5 bilaterally ,improved hip flexor muscle activation 2/5: improved hip flexor and quad activation: 12/5: 2/5 ; 05/09/2018: 2/5 2/6: 3/5 knee, 2 to 2+/5 hip 5/26: 2-/5 hips 3/5 knees 6/17: 2-/5 hips 3/5 knees    Time  8    Period  Weeks    Status  Partially Met    Target Date  03/12/19      PT LONG TERM GOAL #4   Title  Pt's ABC Scale will improve to at least 40% to demonstrate improved balance    Baseline  28.13% 5/15; 24.4% 7/10: 23.4% 8/20: 22. 2% 9/26: 25.3% 10/14: 26.5% 11/15: 24.38% 12/5: 26%; 05/09/2018: 29% 2/6: 23.75% 526: 30% 6/17: 26.8%  7/2: 28% 8/26: 30.3%    Time  8    Period  Weeks    Status  Partially Met    Target Date  03/12/19      PT LONG TERM GOAL #5   Title  Patient will negotiate one step ~4 inches to enter house independently to increase functional independence in natural environment    Baseline  /20: unable to do 9/26: patient too fatigued/not feeling well to perform 10/14: unable to perform at this time 11/15: unable to perform at this  time; 12/5: able to lift 1 inch; 05/09/2018: unable to clear 4" step, able to lift foot ~ 1in 5/26: has been unable to perform 6/17: able to lift foot 1.25 inch 8/26: has been able to step up onto 1.5 inch step in previous sessions    Time  8    Period  Weeks    Status  Partially Met    Target Date  03/12/19      PT LONG TERM GOAL #6   Title  Patient will negotiate 30 ft of changing surfaces outside such as pavement and parking lot to allow patient to walk into church.    Baseline  12/5: does not walk outside 2/24: unable to test due to rain 5/26: unable since COVID 6/17: raining 7/20 too hot to test 8/26: unable to walk outside due to heat and COVID    Time  8    Period  Weeks    Status  Deferred    Target Date  03/12/19      PT LONG TERM GOAL #7   Title  Patient will stand 5 minutes  to increase standing duration to allow patient to return to washing dishes and clothes.     Baseline  12/5: 56 seconds; 05/09/2018: 1 min 18 sec 2/6: 2 minutes last session 5/26: 1 minute 36 seconds limited by breathing 6/17: terminated due to abdomen pain  7/20 terminated due to overheating 8/26: able to stand 3 minutes at home    Time  8    Period  Weeks    Status  Partially Met    Target Date  03/12/19            Plan - 02/07/19 2111    Clinical Impression Statement  Patient presents to physical therapy with good motivation. Utilization of RW for standing interventions is challenging to patient in comparison to // bars due to increased demand on LE's and decreased support provided by UE's. Patient is challenged with initial toe of phase of ambulation and breaking the arc of motion down into smaller components allowed patient for progressive ambulatory mechanics. The patient will continue to benefit from skilled therapeutic intervention to address deficits in strength, mobility, balance, and function for improved overall QOL and safety    Rehab Potential  Good    PT Frequency  2x / week    PT Duration  8 weeks    PT Treatment/Interventions  ADLs/Self Care Home Management;Aquatic Therapy;Biofeedback;Cryotherapy;Electrical Stimulation;Iontophoresis 14m/ml Dexamethasone;Traction;Moist Heat;Ultrasound;DME Instruction;Gait training;Stair training;Functional mobility training;Therapeutic activities;Therapeutic exercise;Balance training;Neuromuscular re-education;Patient/family education;Orthotic Fit/Training;Wheelchair mobility training;Manual techniques;Compression bandaging;Passive range of motion;Dry needling;Energy conservation;Taping;Splinting    PT Next Visit Plan  strength/balance    PT Home Exercise Plan  to initiate next session (please incorporate hip flexor and adductor stretches in HEP if possible)    Consulted and Agree with Plan of Care  Patient       Patient will benefit from  skilled therapeutic intervention in order to improve the following deficits and impairments:  Abnormal gait, Decreased activity tolerance, Decreased balance, Decreased coordination, Decreased endurance, Decreased knowledge of use of DME, Decreased mobility, Decreased range of motion, Decreased safety awareness, Decreased strength, Difficulty walking, Impaired perceived functional ability, Impaired sensation, Impaired tone, Impaired UE functional use, Improper body mechanics, Postural dysfunction  Visit Diagnosis: Muscle weakness (generalized)  Other abnormalities of gait and mobility  Unsteadiness on feet     Problem List Patient Active Problem List   Diagnosis Date Noted  . Abscess of female pelvis   .  SVT (supraventricular tachycardia) ([place])   . Radial styloid tenosynovitis 03/12/2018  . Wheelchair confinement 02/27/2018  . Localized osteoporosis with current pathological fracture with routine healing 01/19/2017  . Wrist fracture 01/16/2017  . Sprain of ankle 03/23/2016  . Closed fracture of lateral malleolus 03/16/2016  . Health care maintenance 01/24/2016  . Blood pressure elevated without history of HTN 10/25/2015  . Essential hypertension 10/25/2015  . Multiple sclerosis ([place]) 10/02/2015  . Chronic left shoulder pain 07/19/2015  . Multiple sclerosis exacerbation ([place]) 07/14/2015  . MS (multiple sclerosis) ([place]) 11/26/2014  . Increased body mass index 11/26/2014  . HPV test positive 11/26/2014  . Status post laparoscopic supracervical hysterectomy 11/26/2014  . Galactorrhea 11/26/2014  . Back ache 05/21/2014  . Adiposity 05/21/2014  . Disordered sleep 05/21/2014  . Muscle spasticity 05/21/2014  . Spasticity 05/21/2014  . Calculus of kidney 12/09/2013  . Renal colic 17/53/0104  . Hypercholesteremia 08/19/2013  . Hereditary and idiopathic neuropathy 08/19/2013  . Hypercholesterolemia without hypertriglyceridemia 08/19/2013  . Bladder infection, chronic 07/25/2012  .  Disorder of bladder function 07/25/2012  . Incomplete bladder emptying 07/25/2012  . Microscopic hematuria 07/25/2012  . Right upper quadrant pain 07/25/2012   Janna Arch, PT, DPT    02/07/2019, 9:12 PM  [place] MAIN [facility] SERVICES [address] [place], [place], [address] Phone: [phone]   Fax:  [phone]  Name: Lisa Williams MRN: [id number] Date of Birth: 1960-07-31

## 2019-02-10 ENCOUNTER — Ambulatory Visit: Payer: Medicare HMO | Admitting: Oncology

## 2019-02-10 ENCOUNTER — Other Ambulatory Visit: Payer: Medicare HMO

## 2019-02-10 ENCOUNTER — Inpatient Hospital Stay: Payer: Medicare HMO | Admitting: Oncology

## 2019-02-10 ENCOUNTER — Inpatient Hospital Stay: Payer: Medicare HMO

## 2019-02-11 ENCOUNTER — Ambulatory Visit: Payer: Medicare HMO

## 2019-02-11 ENCOUNTER — Other Ambulatory Visit: Payer: Self-pay

## 2019-02-11 DIAGNOSIS — R2689 Other abnormalities of gait and mobility: Secondary | ICD-10-CM

## 2019-02-11 DIAGNOSIS — M6281 Muscle weakness (generalized): Secondary | ICD-10-CM | POA: Diagnosis not present

## 2019-02-11 DIAGNOSIS — R2681 Unsteadiness on feet: Secondary | ICD-10-CM

## 2019-02-11 NOTE — Therapy (Signed)
[place] MAIN [facility] SERVICES [address] [place], [place], [address] Phone: [phone]   Fax:  [phone]  Physical Therapy Treatment  Patient Details  Name: Lisa Williams MRN: [id number] Date of Birth: May 06, 1961 Referring Provider (PT): Gurney Maxin, MD   Encounter Date: 02/11/2019  PT End of Session - 02/11/19 1108    Visit Number  119    Number of Visits  129    Date for PT Re-Evaluation  03/12/19    Authorization Type  9/10 with PN 01/01/19    PT Start Time  1015    PT Stop Time  1059    PT Time Calculation (min)  44 min    Equipment Utilized During Treatment  Gait belt    Activity Tolerance  Patient tolerated treatment well;Other (comment)   heat intolerance   Behavior During Therapy  WFL for tasks assessed/performed       Past Medical History:  Diagnosis Date  . Abdominal pain, right upper quadrant   . Back pain   . Calculus of kidney 12/09/2013  . Chronic back pain    unspecified  . Chronic left shoulder pain 07/19/2015  . Functional disorder of bladder    other  . Galactorrhea 11/26/2014   Chronic   . Hereditary and idiopathic neuropathy 08/19/2013  . HPV test positive   . Hypercholesteremia 08/19/2013  . Incomplete bladder emptying   . Microscopic hematuria   . MS (multiple sclerosis) ([place])   . Muscle spasticity 05/21/2014  . Nonspecific findings on examination of urine    other  . Osteopenia   . Status post laparoscopic supracervical hysterectomy 11/26/2014  . Tobacco user 11/26/2014  . Wrist fracture     Past Surgical History:  Procedure Laterality Date  . bilateral tubal ligation  1996  . BREAST CYST EXCISION Left 2002  . KNEE SURGERY     right  . LAPAROSCOPIC SUPRACERVICAL HYSTERECTOMY  08/05/2013  . ORIF WRIST FRACTURE Left 01/17/2017   Procedure: OPEN REDUCTION INTERNAL FIXATION (ORIF) WRIST FRACTURE;  Surgeon: Lovell Sheehan, MD;  Location: [facility] ORS;  Service: Orthopedics;  Laterality: Left;   . TUBAL LIGATION Bilateral   . VAGINAL HYSTERECTOMY  03/2006    There were no vitals filed for this visit.  Subjective Assessment - 02/11/19 1017    Subjective  Patient reports she wasn't able to get her infusions yesterday. Had a good weekend. compliant with HEP, no falls or LOB since last session.    Pertinent History  Pt is a 58 y/o F who presents with BLE weakness and difficulty ambulating due to h/o MS that was diagnosed in 1995. Pt denies any pain associated. Pt was admitted to the hospital in November 2018 due to MS exacerbation. Nov-Dec to HHPT-->[facility] until end of Jan. After rehab the pt was able to ambulate up to 100 ft with the RW. Pt has Bil AFO, her therapist at [facility] thinks she needs a new AFO as her feet still drag when she ambulates, pt reports HHPT mentioned a spring AFO. Pt has custom AFOs that were originally made 4 years ago. Goals: improve balance, to be able to get into the shower with her tub bench without assist, to be able to get up 2 steps at sister's home with railings on both sides but too far apart to reach both sides, to be able to drive adapted car. Pt is staying at other sister's house with one step to get inside and currently picking  up L leg with UEs and then stepping in. Pt steps down leading with LLE. Pt wants to work on walking side to side as this is challenging and she had not yet progressed to this at rehab. Pt able to get in/out the car with assist only to bring BLEs into the car. Hoping to drive by June or July with adapted car (already has one). Pt needs assist bringing LEs into and out of hospital bed. Pt able to perform sit>stand without assist from [facility] with a pillow on the seat for a boost. Pt denies any falls in the past 6 months. Pt has been increasing her time alone at home up to a few hours at a time to become more independent. Pt has both manual and power WC. Uses power WC at home, uses manual WC out in community. Pt able to self propel manual  WC. Pt needs assist putting pants around ankles but she can pull them up. Pt can put on her shoes but needs help getting her leg up to do so. Pt able to tie her shoelaces on her own. Pt has a tub/shower unit at home with a tub bench. No grab bars in the shower. Has hand held shower head. Pt currently taking a sponge bath. Pt has a regular height toilet with a BSC over top. Pt with h/o L wrist fx with no precautions, pt wore her soft brace to evaluation for comfort but reports her wrist has healed and is doing well following OT.     Currently in Pain?  No/denies             in // bars     In // bars: large step forward 10x each LE. BUE support; on seated rest break between LE's.    Band across // bars mid thigh height: tactile/visual cue for knee/hip flexion for foot clearance, knee band 10x each LE.   Ambulate in // bars with focus on knee flexion, weight shift, and step length 2x length of // bars.    Standing hip extension 5x each LE; very challenging and fatiguing    Sit to stand technique with UE support, decreased reliance upon UE's with improved eccentric control, no "plopping". Multiple performances throughout session      seated    Cross body jabs with increasing difficulty and range, increasing challenge to L, R, duck for core stability and postural reactions. -Jab L , R duck for cross body coordination and postural control/righting reactions 10x -Jab R, L duck  for cross body coordination and postural control/righting reactions 10x -Cross body elbow for abdominal strengthening and postural challenge x 60 seconds -cross R/L upper cut L R 10x with cross body coordination and postural strengthening -cross R/L ; elbow L/R 10x with cross body coordination challenge and lateral abdomen activation.    Scapular retractions 10x      Seated rest breaks and use of ice pack utilized to keep body temperature at functional range to reduce MS exacerbation.      Pt educated throughout  session about proper posture and technique with exercises. Improved exercise technique, movement at target joints, use of target muscles after min to mod verbal, visual, tactile cues.                     PT Education - 02/11/19 1108    Education provided  Yes    Education Details  exercies technique, Geophysicist/field seismologist) Educated  Patient  Methods  Explanation;Demonstration;Tactile cues;Verbal cues    Comprehension  Verbalized understanding;Returned demonstration;Verbal cues required;Tactile cues required;Need further instruction       PT Short Term Goals - 01/15/19 1241      PT SHORT TERM GOAL #1   Title  Patient will be compliant with HEP 4-5 days/week for improved carryover between sessions.     Baseline  HEP compliancy between sessions 6/17 compliant    Time  2    Period  Weeks    Status  Achieved    Target Date  10/15/18      PT SHORT TERM GOAL #2   Title  Patient will report no falls/LOB to indicate improved stability with mobility     Baseline  6/17: no falls, one episode of instability 7/20: no falls    Time  2    Period  Weeks    Status  Achieved    Target Date  11/06/18        PT Long Term Goals - 01/15/19 0001      PT LONG TERM GOAL #1   Title  Patient will perform TUG in 40 seconds or less with walker to improve safe negotiation of environment.     Baseline  5/26: 61 seconds 6/17: 60 seconds 7/20: 59 seconds 8/26:  55 seconds    Time  8    Period  Weeks    Status  Partially Met    Target Date  03/12/19      PT LONG TERM GOAL #2   Title  Pt's 66mT will improve to at least 0.5 m/s for improved safety ambulating in home    Baseline  0.14 m/s; 5/15: .256m  7/10: .36 m/s  8/20: .42 m/s  9/26: 0.2652m10/14: .26 m/s improved hip and knee clearance 11/15: 0.1m25mmproved hip and knee clearance; 12/5: .29 m/s improved step length and forward moment: decreased foot drag. 05/09/2018: 0.21 m/s 2/6: .237 m/s 5/26: .23 m/s 6/17: .24 m/s  7/20:  0.26 m/s 8/26: 0.26 m/s    Time  8    Period  Weeks    Status  Partially Met    Target Date  03/12/19      PT LONG TERM GOAL #3   Title  Pt's Bil hip F strength will improve to at least 2+/5 BLEs for improved gait mechanics and safety    Baseline  L: 2/5, R: 1/5 7/10: 2/5 bilaterally  8/20: 2/5 bilat 9/26: 2/5 bilat 10/14: 2/5 bilaterally ,improved hip flexor muscle activation 2/5: improved hip flexor and quad activation: 12/5: 2/5 ; 05/09/2018: 2/5 2/6: 3/5 knee, 2 to 2+/5 hip 5/26: 2-/5 hips 3/5 knees 6/17: 2-/5 hips 3/5 knees    Time  8    Period  Weeks    Status  Partially Met    Target Date  03/12/19      PT LONG TERM GOAL #4   Title  Pt's ABC Scale will improve to at least 40% to demonstrate improved balance    Baseline  28.13% 5/15; 24.4% 7/10: 23.4% 8/20: 22. 2% 9/26: 25.3% 10/14: 26.5% 11/15: 24.38% 12/5: 26%; 05/09/2018: 29% 2/6: 23.75% 526: 30% 6/17: 26.8%  7/2: 28% 8/26: 30.3%    Time  8    Period  Weeks    Status  Partially Met    Target Date  03/12/19      PT LONG TERM GOAL #5   Title  Patient will negotiate one step ~4 inches to enter house independently  to increase functional independence in natural environment    Baseline  /20: unable to do 9/26: patient too fatigued/not feeling well to perform 10/14: unable to perform at this time 11/15: unable to perform at this time; 12/5: able to lift 1 inch; 05/09/2018: unable to clear 4" step, able to lift foot ~ 1in 5/26: has been unable to perform 6/17: able to lift foot 1.25 inch 8/26: has been able to step up onto 1.5 inch step in previous sessions    Time  8    Period  Weeks    Status  Partially Met    Target Date  03/12/19      PT LONG TERM GOAL #6   Title  Patient will negotiate 30 ft of changing surfaces outside such as pavement and parking lot to allow patient to walk into church.    Baseline  12/5: does not walk outside 2/24: unable to test due to rain 5/26: unable since COVID 6/17: raining 7/20 too hot to test 8/26: unable  to walk outside due to heat and COVID    Time  8    Period  Weeks    Status  Deferred    Target Date  03/12/19      PT LONG TERM GOAL #7   Title  Patient will stand 5 minutes to increase standing duration to allow patient to return to washing dishes and clothes.     Baseline  12/5: 56 seconds; 05/09/2018: 1 min 18 sec 2/6: 2 minutes last session 5/26: 1 minute 36 seconds limited by breathing 6/17: terminated due to abdomen pain  7/20 terminated due to overheating 8/26: able to stand 3 minutes at home    Time  8    Period  Weeks    Status  Partially Met    Target Date  03/12/19            Plan - 02/11/19 1109    Clinical Impression Statement  Patient presents to physical therapy with good motivation. Increased standing duration noted with decreased seated rest break duration. Patient demonstrates improved core/trunk stability with higher velocity and amplitude movements without LOB and prior to fatigue. The patient will continue to benefit from skilled therapeutic intervention to address deficits in strength, mobility, balance, and function for improved overall QOL and safety    Rehab Potential  Good    PT Frequency  2x / week    PT Duration  8 weeks    PT Treatment/Interventions  ADLs/Self Care Home Management;Aquatic Therapy;Biofeedback;Cryotherapy;Electrical Stimulation;Iontophoresis 31m/ml Dexamethasone;Traction;Moist Heat;Ultrasound;DME Instruction;Gait training;Stair training;Functional mobility training;Therapeutic activities;Therapeutic exercise;Balance training;Neuromuscular re-education;Patient/family education;Orthotic Fit/Training;Wheelchair mobility training;Manual techniques;Compression bandaging;Passive range of motion;Dry needling;Energy conservation;Taping;Splinting    PT Next Visit Plan  strength/balance    PT Home Exercise Plan  to initiate next session (please incorporate hip flexor and adductor stretches in HEP if possible)    Consulted and Agree with Plan of Care   Patient       Patient will benefit from skilled therapeutic intervention in order to improve the following deficits and impairments:  Abnormal gait, Decreased activity tolerance, Decreased balance, Decreased coordination, Decreased endurance, Decreased knowledge of use of DME, Decreased mobility, Decreased range of motion, Decreased safety awareness, Decreased strength, Difficulty walking, Impaired perceived functional ability, Impaired sensation, Impaired tone, Impaired UE functional use, Improper body mechanics, Postural dysfunction  Visit Diagnosis: Muscle weakness (generalized)  Other abnormalities of gait and mobility  Unsteadiness on feet     Problem List Patient Active Problem List  Diagnosis Date Noted  . Abscess of female pelvis   . SVT (supraventricular tachycardia) ([place])   . Radial styloid tenosynovitis 03/12/2018  . Wheelchair confinement 02/27/2018  . Localized osteoporosis with current pathological fracture with routine healing 01/19/2017  . Wrist fracture 01/16/2017  . Sprain of ankle 03/23/2016  . Closed fracture of lateral malleolus 03/16/2016  . Health care maintenance 01/24/2016  . Blood pressure elevated without history of HTN 10/25/2015  . Essential hypertension 10/25/2015  . Multiple sclerosis ([place]) 10/02/2015  . Chronic left shoulder pain 07/19/2015  . Multiple sclerosis exacerbation ([place]) 07/14/2015  . MS (multiple sclerosis) ([place]) 11/26/2014  . Increased body mass index 11/26/2014  . HPV test positive 11/26/2014  . Status post laparoscopic supracervical hysterectomy 11/26/2014  . Galactorrhea 11/26/2014  . Back ache 05/21/2014  . Adiposity 05/21/2014  . Disordered sleep 05/21/2014  . Muscle spasticity 05/21/2014  . Spasticity 05/21/2014  . Calculus of kidney 12/09/2013  . Renal colic 90/22/8406  . Hypercholesteremia 08/19/2013  . Hereditary and idiopathic neuropathy 08/19/2013  . Hypercholesterolemia without hypertriglyceridemia 08/19/2013  .  Bladder infection, chronic 07/25/2012  . Disorder of bladder function 07/25/2012  . Incomplete bladder emptying 07/25/2012  . Microscopic hematuria 07/25/2012  . Right upper quadrant pain 07/25/2012   Janna Arch, PT, DPT   02/11/2019, 11:10 AM  [place] MAIN [facility] SERVICES [address] [place], [place], [address] Phone: [phone]   Fax:  [phone]  Name: Lisa Williams MRN: [id number] Date of Birth: Aug 11, 1960

## 2019-02-13 ENCOUNTER — Other Ambulatory Visit: Payer: Self-pay

## 2019-02-13 ENCOUNTER — Ambulatory Visit: Payer: Medicare HMO

## 2019-02-13 DIAGNOSIS — R2689 Other abnormalities of gait and mobility: Secondary | ICD-10-CM

## 2019-02-13 DIAGNOSIS — M6281 Muscle weakness (generalized): Secondary | ICD-10-CM | POA: Diagnosis not present

## 2019-02-13 DIAGNOSIS — R2681 Unsteadiness on feet: Secondary | ICD-10-CM

## 2019-02-13 NOTE — Therapy (Signed)
[place] MAIN [facility] SERVICES [address] [place], [place], [address] Phone: [phone]   Fax:  [phone]  Physical Therapy Treatment Physical Therapy Progress Note   Dates of reporting period  01/01/19   to   02/13/19  Patient Details  Name: Lisa Williams MRN: [id number] Date of Birth: 07-07-1960 Referring Provider (PT): Gurney Maxin, MD   Encounter Date: 02/13/2019  PT End of Session - 02/14/19 1347    Visit Number  120    Number of Visits  129    Date for PT Re-Evaluation  03/12/19    Authorization Type  10/10 with PN 01/01/19; next session 1/10 PN 10/8    PT Start Time  1030    PT Stop Time  1110    PT Time Calculation (min)  40 min    Equipment Utilized During Treatment  Gait belt    Activity Tolerance  Patient tolerated treatment well;Patient limited by pain   heat intolerance   Behavior During Therapy  [facility] for tasks assessed/performed       Past Medical History:  Diagnosis Date  . Abdominal pain, right upper quadrant   . Back pain   . Calculus of kidney 12/09/2013  . Chronic back pain    unspecified  . Chronic left shoulder pain 07/19/2015  . Functional disorder of bladder    other  . Galactorrhea 11/26/2014   Chronic   . Hereditary and idiopathic neuropathy 08/19/2013  . HPV test positive   . Hypercholesteremia 08/19/2013  . Incomplete bladder emptying   . Microscopic hematuria   . MS (multiple sclerosis) ([place])   . Muscle spasticity 05/21/2014  . Nonspecific findings on examination of urine    other  . Osteopenia   . Status post laparoscopic supracervical hysterectomy 11/26/2014  . Tobacco user 11/26/2014  . Wrist fracture     Past Surgical History:  Procedure Laterality Date  . bilateral tubal ligation  1996  . BREAST CYST EXCISION Left 2002  . KNEE SURGERY     right  . LAPAROSCOPIC SUPRACERVICAL HYSTERECTOMY  08/05/2013  . ORIF WRIST FRACTURE Left 01/17/2017   Procedure: OPEN REDUCTION INTERNAL  FIXATION (ORIF) WRIST FRACTURE;  Surgeon: Lovell Sheehan, MD;  Location: [facility] ORS;  Service: Orthopedics;  Laterality: Left;  . TUBAL LIGATION Bilateral   . VAGINAL HYSTERECTOMY  03/2006    There were no vitals filed for this visit.  Subjective Assessment - 02/14/19 1345    Subjective  Patient reports compliant with HEP, no falls or LOB since last session. Has been having some pain in side since waking up.    Pertinent History  Pt is a 58 y/o F who presents with BLE weakness and difficulty ambulating due to h/o MS that was diagnosed in 1995. Pt denies any pain associated. Pt was admitted to the hospital in November 2018 due to MS exacerbation. Nov-Dec to HHPT-->[facility] until end of Jan. After rehab the pt was able to ambulate up to 100 ft with the RW. Pt has Bil AFO, her therapist at [place] thinks she needs a new AFO as her feet still drag when she ambulates, pt reports HHPT mentioned a spring AFO. Pt has custom AFOs that were originally made 4 years ago. Goals: improve balance, to be able to get into the shower with her tub bench without assist, to be able to get up 2 steps at sister's home with railings on both sides but too far apart to reach both sides,  to be able to drive adapted car. Pt is staying at other sister's house with one step to get inside and currently picking up L leg with UEs and then stepping in. Pt steps down leading with LLE. Pt wants to work on walking side to side as this is challenging and she had not yet progressed to this at rehab. Pt able to get in/out the car with assist only to bring BLEs into the car. Hoping to drive by June or July with adapted car (already has one). Pt needs assist bringing LEs into and out of hospital bed. Pt able to perform sit>stand without assist from [facility] with a pillow on the seat for a boost. Pt denies any falls in the past 6 months. Pt has been increasing her time alone at home up to a few hours at a time to become more independent. Pt has  both manual and power WC. Uses power WC at home, uses manual WC out in community. Pt able to self propel manual WC. Pt needs assist putting pants around ankles but she can pull them up. Pt can put on her shoes but needs help getting her leg up to do so. Pt able to tie her shoelaces on her own. Pt has a tub/shower unit at home with a tub bench. No grab bars in the shower. Has hand held shower head. Pt currently taking a sponge bath. Pt has a regular height toilet with a BSC over top. Pt with h/o L wrist fx with no precautions, pt wore her soft brace to evaluation for comfort but reports her wrist has healed and is doing well following OT.     Currently in Pain?  Yes    Pain Score  4     Pain Location  Flank    Pain Orientation  Right    Pain Descriptors / Indicators  Stabbing    Pain Type  Acute pain    Pain Onset  Today    Pain Frequency  Intermittent    Aggravating Factors   transfers and standing          Goals  10 MWT 0.28 m/s with RW and CGA  ABC: 32.1%  Walk outside 30 ft : able to walk 20 ft to cart at [employer]  Stand 5 minutes: had twinge of R side terminating stance  Pain reduction techniques:  -Seated swiss ball rollouts for pain reduction of R side 10x forward, 10x lateral, hold 8-10 seconds -scapular retraction with rhythmic rotation and depression overpressure from PT 3 minutes RUE -scapular mobilizations : depression, retraction: 20 seconds each direction 8x each -Right lat overpressure stretch 60 seconds -lower 3 ribs inferior mobilization 20 seconds x5 for pain reduction with repetition R -scapular retraction squeezes squeezing PT finger 15x  -isometric hip flexion into PT hand 10x 3 second each LE -isometric hip abduction/adduction 10x 3 seconds each LE -LAQ modified each LE 10x each           Patient's condition has the potential to improve in response to therapy. Maximum improvement is yet to be obtained. The anticipated improvement is attainable and  reasonable in a generally predictable time.  Patient reports in general she is more mobile at home and does not have to rely on UE usage as much for transfers and walking increasing her independence.                 PT Education - 02/14/19 1346    Education provided  Yes  Education Details  goals, pain reduction techniques    Person(s) Educated  Patient    Methods  Explanation;Demonstration;Tactile cues;Verbal cues    Comprehension  Verbalized understanding;Returned demonstration;Verbal cues required;Tactile cues required       PT Short Term Goals - 01/15/19 1241      PT SHORT TERM GOAL #1   Title  Patient will be compliant with HEP 4-5 days/week for improved carryover between sessions.     Baseline  HEP compliancy between sessions 6/17 compliant    Time  2    Period  Weeks    Status  Achieved    Target Date  10/15/18      PT SHORT TERM GOAL #2   Title  Patient will report no falls/LOB to indicate improved stability with mobility     Baseline  6/17: no falls, one episode of instability 7/20: no falls    Time  2    Period  Weeks    Status  Achieved    Target Date  11/06/18        PT Long Term Goals - 02/14/19 0001      PT LONG TERM GOAL #1   Title  Patient will perform TUG in 40 seconds or less with walker to improve safe negotiation of environment.     Baseline  10/9: deferred due to pain 5/26: 61 seconds 6/17: 60 seconds 7/20: 59 seconds 8/26:  55 seconds    Time  8    Period  Weeks    Status  Partially Met    Target Date  03/12/19      PT LONG TERM GOAL #2   Title  Pt's 17mT will improve to at least 0.5 m/s for improved safety ambulating in home    Baseline  0.14 m/s; 5/15: .274m  7/10: .36 m/s  8/20: .42 m/s  9/26: 0.2612m10/14: .26 m/s improved hip and knee clearance 11/15: 0.4m4mmproved hip and knee clearance; 12/5: .29 m/s improved step length and forward moment: decreased foot drag. 05/09/2018: 0.21 m/s 2/6: .237 m/s 5/26: .23 m/s 6/17: .24 m/s   7/20: 0.26 m/s 8/26: 0.26 m/s 10/8: 0.28 m/s    Time  8    Period  Weeks    Status  Partially Met    Target Date  03/12/19      PT LONG TERM GOAL #3   Title  Pt's Bil hip F strength will improve to at least 2+/5 BLEs for improved gait mechanics and safety    Baseline  10/8: deferred due to pain L: 2/5, R: 1/5 7/10: 2/5 bilaterally  8/20: 2/5 bilat 9/26: 2/5 bilat 10/14: 2/5 bilaterally ,improved hip flexor muscle activation 2/5: improved hip flexor and quad activation: 12/5: 2/5 ; 05/09/2018: 2/5 2/6: 3/5 knee, 2 to 2+/5 hip 5/26: 2-/5 hips 3/5 knees 6/17: 2-/5 hips 3/5 knees    Time  8    Period  Weeks    Status  Partially Met    Target Date  03/12/19      PT LONG TERM GOAL #4   Title  Pt's ABC Scale will improve to at least 40% to demonstrate improved balance    Baseline  10/9: 32.1% 28.13% 5/15; 24.4% 7/10: 23.4% 8/20: 22. 2% 9/26: 25.3% 10/14: 26.5% 11/15: 24.38% 12/5: 26%; 05/09/2018: 29% 2/6: 23.75% 526: 30% 6/17: 26.8%  7/2: 28% 8/26: 30.3%    Time  8    Period  Weeks    Status  Partially Met  Target Date  03/12/19      PT LONG TERM GOAL #5   Title  Patient will negotiate one step ~4 inches to enter house independently to increase functional independence in natural environment    Baseline  10/9: has lifted 1.5 inch in previous sessions /20: unable to do 9/26: patient too fatigued/not feeling well to perform 10/14: unable to perform at this time 11/15: unable to perform at this time; 12/5: able to lift 1 inch; 05/09/2018: unable to clear 4" step, able to lift foot ~ 1in 5/26: has been unable to perform 6/17: able to lift foot 1.25 inch 8/26: has been able to step up onto 1.5 inch step in previous sessions    Time  8    Period  Weeks    Status  Partially Met    Target Date  03/12/19      PT LONG TERM GOAL #6   Title  Patient will negotiate 30 ft of changing surfaces outside such as pavement and parking lot to allow patient to walk into church.    Baseline  10/9: walks 20 ft outside  at store now 12/5: does not walk outside 2/24: unable to test due to rain 5/26: unable since COVID 6/17: raining 7/20 too hot to test 8/26: unable to walk outside due to heat and COVID    Time  8    Period  Weeks    Status  Partially Met    Target Date  03/12/19      PT LONG TERM GOAL #7   Title  Patient will stand 5 minutes to increase standing duration to allow patient to return to washing dishes and clothes.     Baseline  10/9: deferred due to pain 12/5: 56 seconds; 05/09/2018: 1 min 18 sec 2/6: 2 minutes last session 5/26: 1 minute 36 seconds limited by breathing 6/17: terminated due to abdomen pain  7/20 terminated due to overheating 8/26: able to stand 3 minutes at home    Time  8    Period  Weeks    Status  Partially Met    Target Date  03/12/19            Plan - 02/14/19 1348    Clinical Impression Statement  Patient is progressing with functional ambulation, increasing velocity of gait to 0.28 m/s. The rest of patient's goals deferred to next session due to pain in R side of trunk causing buckling of knees from pain. Pain reduced during session and patient reports pain completely gone by end of session. Patient's condition has the potential to improve in response to therapy. Maximum improvement is yet to be obtained. The anticipated improvement is attainable and reasonable in a generally predictable time.The patient will continue to benefit from skilled therapeutic intervention to address deficits in strength, mobility, balance, and function for improved overall QOL and safety    Rehab Potential  Good    PT Frequency  2x / week    PT Duration  8 weeks    PT Treatment/Interventions  ADLs/Self Care Home Management;Aquatic Therapy;Biofeedback;Cryotherapy;Electrical Stimulation;Iontophoresis 62m/ml Dexamethasone;Traction;Moist Heat;Ultrasound;DME Instruction;Gait training;Stair training;Functional mobility training;Therapeutic activities;Therapeutic exercise;Balance  training;Neuromuscular re-education;Patient/family education;Orthotic Fit/Training;Wheelchair mobility training;Manual techniques;Compression bandaging;Passive range of motion;Dry needling;Energy conservation;Taping;Splinting    PT Next Visit Plan  strength/balance    PT Home Exercise Plan  to initiate next session (please incorporate hip flexor and adductor stretches in HEP if possible)    Consulted and Agree with Plan of Care  Patient  Patient will benefit from skilled therapeutic intervention in order to improve the following deficits and impairments:  Abnormal gait, Decreased activity tolerance, Decreased balance, Decreased coordination, Decreased endurance, Decreased knowledge of use of DME, Decreased mobility, Decreased range of motion, Decreased safety awareness, Decreased strength, Difficulty walking, Impaired perceived functional ability, Impaired sensation, Impaired tone, Impaired UE functional use, Improper body mechanics, Postural dysfunction  Visit Diagnosis: Muscle weakness (generalized)  Other abnormalities of gait and mobility  Unsteadiness on feet     Problem List Patient Active Problem List   Diagnosis Date Noted  . Abscess of female pelvis   . SVT (supraventricular tachycardia) ([place])   . Radial styloid tenosynovitis 03/12/2018  . Wheelchair confinement 02/27/2018  . Localized osteoporosis with current pathological fracture with routine healing 01/19/2017  . Wrist fracture 01/16/2017  . Sprain of ankle 03/23/2016  . Closed fracture of lateral malleolus 03/16/2016  . Health care maintenance 01/24/2016  . Blood pressure elevated without history of HTN 10/25/2015  . Essential hypertension 10/25/2015  . Multiple sclerosis ([place]) 10/02/2015  . Chronic left shoulder pain 07/19/2015  . Multiple sclerosis exacerbation ([place]) 07/14/2015  . MS (multiple sclerosis) ([place]) 11/26/2014  . Increased body mass index 11/26/2014  . HPV test positive 11/26/2014  . Status post  laparoscopic supracervical hysterectomy 11/26/2014  . Galactorrhea 11/26/2014  . Back ache 05/21/2014  . Adiposity 05/21/2014  . Disordered sleep 05/21/2014  . Muscle spasticity 05/21/2014  . Spasticity 05/21/2014  . Calculus of kidney 12/09/2013  . Renal colic 05/18/347  . Hypercholesteremia 08/19/2013  . Hereditary and idiopathic neuropathy 08/19/2013  . Hypercholesterolemia without hypertriglyceridemia 08/19/2013  . Bladder infection, chronic 07/25/2012  . Disorder of bladder function 07/25/2012  . Incomplete bladder emptying 07/25/2012  . Microscopic hematuria 07/25/2012  . Right upper quadrant pain 07/25/2012   Janna Arch, PT, DPT   02/14/2019, 1:53 PM  [place] MAIN [facility] SERVICES [address] [place], [place], [address] Phone: [phone]   Fax:  [phone]  Name: Lisa Williams MRN: [id number] Date of Birth: [birth year]

## 2019-02-17 ENCOUNTER — Ambulatory Visit: Payer: Medicare HMO

## 2019-02-17 ENCOUNTER — Other Ambulatory Visit: Payer: Self-pay

## 2019-02-17 DIAGNOSIS — R2681 Unsteadiness on feet: Secondary | ICD-10-CM

## 2019-02-17 DIAGNOSIS — M6281 Muscle weakness (generalized): Secondary | ICD-10-CM

## 2019-02-17 DIAGNOSIS — R2689 Other abnormalities of gait and mobility: Secondary | ICD-10-CM

## 2019-02-17 NOTE — Therapy (Signed)
[place] MAIN [facility] SERVICES [place], [place], [address] Phone: [phone]   Fax:  [phone]  Physical Therapy Treatment  Patient Details  Name: Lisa Williams MRN: [id number] Date of Birth: Apr 26, 1961 Referring Provider (PT): Gurney Maxin, MD   Encounter Date: 02/17/2019  PT End of Session - 02/17/19 1150    Visit Number  121    Number of Visits  129    Date for PT Re-Evaluation  03/12/19    Authorization Type  1/10 PN 10/8    PT Start Time  1030    PT Stop Time  1118    PT Time Calculation (min)  48 min    Equipment Utilized During Treatment  Gait belt    Activity Tolerance  Patient tolerated treatment well;Patient limited by pain   heat intolerance   Behavior During Therapy  [facility] for tasks assessed/performed       Past Medical History:  Diagnosis Date  . Abdominal pain, right upper quadrant   . Back pain   . Calculus of kidney 12/09/2013  . Chronic back pain    unspecified  . Chronic left shoulder pain 07/19/2015  . Functional disorder of bladder    other  . Galactorrhea 11/26/2014   Chronic   . Hereditary and idiopathic neuropathy 08/19/2013  . HPV test positive   . Hypercholesteremia 08/19/2013  . Incomplete bladder emptying   . Microscopic hematuria   . MS (multiple sclerosis) ([place])   . Muscle spasticity 05/21/2014  . Nonspecific findings on examination of urine    other  . Osteopenia   . Status post laparoscopic supracervical hysterectomy 11/26/2014  . Tobacco user 11/26/2014  . Wrist fracture     Past Surgical History:  Procedure Laterality Date  . bilateral tubal ligation  1996  . BREAST CYST EXCISION Left 2002  . KNEE SURGERY     right  . LAPAROSCOPIC SUPRACERVICAL HYSTERECTOMY  08/05/2013  . ORIF WRIST FRACTURE Left 01/17/2017   Procedure: OPEN REDUCTION INTERNAL FIXATION (ORIF) WRIST FRACTURE;  Surgeon: Lovell Sheehan, MD;  Location: [facility] ORS;  Service: Orthopedics;  Laterality: Left;   . TUBAL LIGATION Bilateral   . VAGINAL HYSTERECTOMY  03/2006    There were no vitals filed for this visit.  Subjective Assessment - 02/17/19 1040    Subjective  Patient reports compliance with HEP. no falls or LOB since last session. Patient reports some pain in side after last session, was improved right after but returned the day after.    Pertinent History  Pt is a 58 y/o F who presents with BLE weakness and difficulty ambulating due to h/o MS that was diagnosed in 1995. Pt denies any pain associated. Pt was admitted to the hospital in November 2018 due to MS exacerbation. Nov-Dec to HHPT-->[facility] until end of Jan. After rehab the pt was able to ambulate up to 100 ft with the RW. Pt has Bil AFO, her therapist at [facility] thinks she needs a new AFO as her feet still drag when she ambulates, pt reports HHPT mentioned a spring AFO. Pt has custom AFOs that were originally made 4 years ago. Goals: improve balance, to be able to get into the shower with her tub bench without assist, to be able to get up 2 steps at sister's home with railings on both sides but too far apart to reach both sides, to be able to drive adapted car. Pt is staying at other sister's house with one  step to get inside and currently picking up L leg with UEs and then stepping in. Pt steps down leading with LLE. Pt wants to work on walking side to side as this is challenging and she had not yet progressed to this at rehab. Pt able to get in/out the car with assist only to bring BLEs into the car. Hoping to drive by June or July with adapted car (already has one). Pt needs assist bringing LEs into and out of hospital bed. Pt able to perform sit>stand without assist from [facility] with a pillow on the seat for a boost. Pt denies any falls in the past 6 months. Pt has been increasing her time alone at home up to a few hours at a time to become more independent. Pt has both manual and power WC. Uses power WC at home, uses manual WC out in  community. Pt able to self propel manual WC. Pt needs assist putting pants around ankles but she can pull them up. Pt can put on her shoes but needs help getting her leg up to do so. Pt able to tie her shoelaces on her own. Pt has a tub/shower unit at home with a tub bench. No grab bars in the shower. Has hand held shower head. Pt currently taking a sponge bath. Pt has a regular height toilet with a BSC over top. Pt with h/o L wrist fx with no precautions, pt wore her soft brace to evaluation for comfort but reports her wrist has healed and is doing well following OT.     Currently in Pain?  No/denies         in // bars   In // bars: large stepforward 8x each LE. BUE support; on seated rest break between LE's.   Band across // bars mid thigh height: tactile/visual cue for knee/hip flexion for foot clearance, knee band 6x each LE.  Standing hip extension 8x each LE; very challenging and fatiguing  Sit to stand technique with UE support, decreased reliance upon UE's with improved eccentric control, no "plopping". Multiple performances throughout session  Seated:  2000 gr weight Arc of motion from one knee to another 10x    Cross body jabs with increasing difficulty and range, increasing challenge to L, R, duck for core stability and postural reactions. -Jab L , R duck for cross body coordination and postural control/righting reactions 10x -Jab R, L duck for cross body coordination and postural control/righting reactions 10x -Cross body elbowfor abdominal strengthening and postural challenge x 60 seconds -cross R/L upper cut L R 10x with cross body coordination and postural strengthening -cross R/L ; elbow L/R 10x with cross body coordination challenge and lateral abdomen activation.      Seated rest breaks and use of ice pack utilized to keep body temperature at functional range to reduce MS exacerbation.  Pt educated throughout session about proper posture and technique  with exercises. Improved exercise technique, movement at target joints, use of target muscles after min to mod verbal, visual, tactile cues.            PT Education - 02/17/19 1148    Education provided  Yes    Education Details  exercise technique, body mechanics    Person(s) Educated  Patient    Methods  Explanation;Demonstration;Tactile cues;Verbal cues    Comprehension  Verbalized understanding;Returned demonstration;Verbal cues required;Tactile cues required       PT Short Term Goals - 01/15/19 1241      PT  SHORT TERM GOAL #1   Title  Patient will be compliant with HEP 4-5 days/week for improved carryover between sessions.     Baseline  HEP compliancy between sessions 6/17 compliant    Time  2    Period  Weeks    Status  Achieved    Target Date  10/15/18      PT SHORT TERM GOAL #2   Title  Patient will report no falls/LOB to indicate improved stability with mobility     Baseline  6/17: no falls, one episode of instability 7/20: no falls    Time  2    Period  Weeks    Status  Achieved    Target Date  11/06/18        PT Long Term Goals - 02/14/19 0001      PT LONG TERM GOAL #1   Title  Patient will perform TUG in 40 seconds or less with walker to improve safe negotiation of environment.     Baseline  10/9: deferred due to pain 5/26: 61 seconds 6/17: 60 seconds 7/20: 59 seconds 8/26:  55 seconds    Time  8    Period  Weeks    Status  Partially Met    Target Date  03/12/19      PT LONG TERM GOAL #2   Title  Pt's 75mT will improve to at least 0.5 m/s for improved safety ambulating in home    Baseline  0.14 m/s; 5/15: .225m  7/10: .36 m/s  8/20: .42 m/s  9/26: 0.2663m10/14: .26 m/s improved hip and knee clearance 11/15: 0.35m26mmproved hip and knee clearance; 12/5: .29 m/s improved step length and forward moment: decreased foot drag. 05/09/2018: 0.21 m/s 2/6: .237 m/s 5/26: .23 m/s 6/17: .24 m/s  7/20: 0.26 m/s 8/26: 0.26 m/s 10/8: 0.28 m/s    Time  8     Period  Weeks    Status  Partially Met    Target Date  03/12/19      PT LONG TERM GOAL #3   Title  Pt's Bil hip F strength will improve to at least 2+/5 BLEs for improved gait mechanics and safety    Baseline  10/8: deferred due to pain L: 2/5, R: 1/5 7/10: 2/5 bilaterally  8/20: 2/5 bilat 9/26: 2/5 bilat 10/14: 2/5 bilaterally ,improved hip flexor muscle activation 2/5: improved hip flexor and quad activation: 12/5: 2/5 ; 05/09/2018: 2/5 2/6: 3/5 knee, 2 to 2+/5 hip 5/26: 2-/5 hips 3/5 knees 6/17: 2-/5 hips 3/5 knees    Time  8    Period  Weeks    Status  Partially Met    Target Date  03/12/19      PT LONG TERM GOAL #4   Title  Pt's ABC Scale will improve to at least 40% to demonstrate improved balance    Baseline  10/9: 32.1% 28.13% 5/15; 24.4% 7/10: 23.4% 8/20: 22. 2% 9/26: 25.3% 10/14: 26.5% 11/15: 24.38% 12/5: 26%; 05/09/2018: 29% 2/6: 23.75% 526: 30% 6/17: 26.8%  7/2: 28% 8/26: 30.3%    Time  8    Period  Weeks    Status  Partially Met    Target Date  03/12/19      PT LONG TERM GOAL #5   Title  Patient will negotiate one step ~4 inches to enter house independently to increase functional independence in natural environment    Baseline  10/9: has lifted 1.5 inch in previous sessions /20: unable to  do 9/26: patient too fatigued/not feeling well to perform 10/14: unable to perform at this time 11/15: unable to perform at this time; 12/5: able to lift 1 inch; 05/09/2018: unable to clear 4" step, able to lift foot ~ 1in 5/26: has been unable to perform 6/17: able to lift foot 1.25 inch 8/26: has been able to step up onto 1.5 inch step in previous sessions    Time  8    Period  Weeks    Status  Partially Met    Target Date  03/12/19      PT LONG TERM GOAL #6   Title  Patient will negotiate 30 ft of changing surfaces outside such as pavement and parking lot to allow patient to walk into church.    Baseline  10/9: walks 20 ft outside at store now 12/5: does not walk outside 2/24: unable to test  due to rain 5/26: unable since COVID 6/17: raining 7/20 too hot to test 8/26: unable to walk outside due to heat and COVID    Time  8    Period  Weeks    Status  Partially Met    Target Date  03/12/19      PT LONG TERM GOAL #7   Title  Patient will stand 5 minutes to increase standing duration to allow patient to return to washing dishes and clothes.     Baseline  10/9: deferred due to pain 12/5: 56 seconds; 05/09/2018: 1 min 18 sec 2/6: 2 minutes last session 5/26: 1 minute 36 seconds limited by breathing 6/17: terminated due to abdomen pain  7/20 terminated due to overheating 8/26: able to stand 3 minutes at home    Time  8    Period  Weeks    Status  Partially Met    Target Date  03/12/19            Plan - 02/17/19 1212    Clinical Impression Statement  Patient is more fatigued this session, becoming out of breath quickly. She continues to demonstrate excellent motivation despite fatigue and performs good quality interventions. The patient will continue to benefit from skilled therapeutic intervention to address deficits in strength, mobility, balance, and function for improved overall QOL and safety    Rehab Potential  Good    PT Frequency  2x / week    PT Duration  8 weeks    PT Treatment/Interventions  ADLs/Self Care Home Management;Aquatic Therapy;Biofeedback;Cryotherapy;Electrical Stimulation;Iontophoresis '4mg'$ /ml Dexamethasone;Traction;Moist Heat;Ultrasound;DME Instruction;Gait training;Stair training;Functional mobility training;Therapeutic activities;Therapeutic exercise;Balance training;Neuromuscular re-education;Patient/family education;Orthotic Fit/Training;Wheelchair mobility training;Manual techniques;Compression bandaging;Passive range of motion;Dry needling;Energy conservation;Taping;Splinting    PT Next Visit Plan  strength/balance    PT Home Exercise Plan  to initiate next session (please incorporate hip flexor and adductor stretches in HEP if possible)    Consulted and  Agree with Plan of Care  Patient       Patient will benefit from skilled therapeutic intervention in order to improve the following deficits and impairments:  Abnormal gait, Decreased activity tolerance, Decreased balance, Decreased coordination, Decreased endurance, Decreased knowledge of use of DME, Decreased mobility, Decreased range of motion, Decreased safety awareness, Decreased strength, Difficulty walking, Impaired perceived functional ability, Impaired sensation, Impaired tone, Impaired UE functional use, Improper body mechanics, Postural dysfunction  Visit Diagnosis: Muscle weakness (generalized)  Other abnormalities of gait and mobility  Unsteadiness on feet     Problem List Patient Active Problem List   Diagnosis Date Noted  . Abscess of female pelvis   .  SVT (supraventricular tachycardia) ([place])   . Radial styloid tenosynovitis 03/12/2018  . Wheelchair confinement 02/27/2018  . Localized osteoporosis with current pathological fracture with routine healing 01/19/2017  . Wrist fracture 01/16/2017  . Sprain of ankle 03/23/2016  . Closed fracture of lateral malleolus 03/16/2016  . Health care maintenance 01/24/2016  . Blood pressure elevated without history of HTN 10/25/2015  . Essential hypertension 10/25/2015  . Multiple sclerosis ([place]) 10/02/2015  . Chronic left shoulder pain 07/19/2015  . Multiple sclerosis exacerbation ([place]) 07/14/2015  . MS (multiple sclerosis) ([place]) 11/26/2014  . Increased body mass index 11/26/2014  . HPV test positive 11/26/2014  . Status post laparoscopic supracervical hysterectomy 11/26/2014  . Galactorrhea 11/26/2014  . Back ache 05/21/2014  . Adiposity 05/21/2014  . Disordered sleep 05/21/2014  . Muscle spasticity 05/21/2014  . Spasticity 05/21/2014  . Calculus of kidney 12/09/2013  . Renal colic 55/00/1642  . Hypercholesteremia 08/19/2013  . Hereditary and idiopathic neuropathy 08/19/2013  . Hypercholesterolemia without  hypertriglyceridemia 08/19/2013  . Bladder infection, chronic 07/25/2012  . Disorder of bladder function 07/25/2012  . Incomplete bladder emptying 07/25/2012  . Microscopic hematuria 07/25/2012  . Right upper quadrant pain 07/25/2012   Janna Arch, PT, DPT   02/17/2019, 12:13 PM  [place] MAIN [facility] SERVICES [address] [place], [place], [address] Phone: [phone]   Fax:  [phone]  Name: Lisa Williams MRN: [id number] Date of Birth: 1960/10/06

## 2019-02-19 ENCOUNTER — Ambulatory Visit: Payer: Medicare HMO

## 2019-02-24 ENCOUNTER — Other Ambulatory Visit: Payer: Self-pay

## 2019-02-24 ENCOUNTER — Ambulatory Visit: Payer: Medicare HMO

## 2019-02-24 DIAGNOSIS — M6281 Muscle weakness (generalized): Secondary | ICD-10-CM | POA: Diagnosis not present

## 2019-02-24 DIAGNOSIS — R2689 Other abnormalities of gait and mobility: Secondary | ICD-10-CM

## 2019-02-24 DIAGNOSIS — R2681 Unsteadiness on feet: Secondary | ICD-10-CM

## 2019-02-24 NOTE — Therapy (Signed)
[facility] [address] [place], [place], [address] Phone: [phone]   Fax:  [phone]  Physical Therapy Treatment  Patient Details  Name: Lisa Williams MRN: [id number] Date of Birth: 10/30/1960 Referring Provider (PT): Zachary Potter, MD   Encounter Date: 02/24/2019  PT End of Session - 02/24/19 1235    Visit Number  122    Number of Visits  129    Date for PT Re-Evaluation  03/12/19    Authorization Type  2/10 PN 10/8    PT Start Time  1030    PT Stop Time  1110    PT Time Calculation (min)  40 min    Equipment Utilized During Treatment  Gait belt    Activity Tolerance  Patient tolerated treatment well   heat intolerance   Behavior During Therapy  WFL for tasks assessed/performed       Past Medical History:  Diagnosis Date  . Abdominal pain, right upper quadrant   . Back pain   . Calculus of kidney 12/09/2013  . Chronic back pain    unspecified  . Chronic left shoulder pain 07/19/2015  . Functional disorder of bladder    other  . Galactorrhea 11/26/2014   Chronic   . Hereditary and idiopathic neuropathy 08/19/2013  . HPV test positive   . Hypercholesteremia 08/19/2013  . Incomplete bladder emptying   . Microscopic hematuria   . MS (multiple sclerosis) (HCC)   . Muscle spasticity 05/21/2014  . Nonspecific findings on examination of urine    other  . Osteopenia   . Status post laparoscopic supracervical hysterectomy 11/26/2014  . Tobacco user 11/26/2014  . Wrist fracture     Past Surgical History:  Procedure Laterality Date  . bilateral tubal ligation  1996  . BREAST CYST EXCISION Left 2002  . KNEE SURGERY     right  . LAPAROSCOPIC SUPRACERVICAL HYSTERECTOMY  08/05/2013  . ORIF WRIST FRACTURE Left 01/17/2017   Procedure: OPEN REDUCTION INTERNAL FIXATION (ORIF) WRIST FRACTURE;  Surgeon: Bowers, James R, MD;  Location: [facility] ORS;  Service: Orthopedics;  Laterality: Left;  . TUBAL LIGATION  Bilateral   . VAGINAL HYSTERECTOMY  03/2006    There were no vitals filed for this visit.  Subjective Assessment - 02/24/19 1045    Subjective  Patient missed last session due to illness. Is feeling better this session. Will be getting her infusion this Friday.    Pertinent History  Pt is a 58 y/o F who presents with BLE weakness and difficulty ambulating due to h/o MS that was diagnosed in 1995. Pt denies any pain associated. Pt was admitted to the hospital in November 2018 due to MS exacerbation. Nov-Dec to [facility] until end of Jan. After rehab the pt was able to ambulate up to 100 ft with the RW. Pt has Bil AFO, her therapist at [facility] thinks she needs a new AFO as her feet still drag when she ambulates, pt reports HHPT mentioned a spring AFO. Pt has custom AFOs that were originally made 4 years ago. Goals: improve balance, to be able to get into the shower with her tub bench without assist, to be able to get up 2 steps at sister's home with railings on both sides but too far apart to reach both sides, to be able to drive adapted car. Pt is staying at other sister's house with one step to get inside and currently picking up L leg with UEs and then   stepping in. Pt steps down leading with LLE. Pt wants to work on walking side to side as this is challenging and she had not yet progressed to this at rehab. Pt able to get in/out the car with assist only to bring BLEs into the car. Hoping to drive by June or July with adapted car (already has one). Pt needs assist bringing LEs into and out of hospital bed. Pt able to perform sit>stand without assist from WC with a pillow on the seat for a boost. Pt denies any falls in the past 6 months. Pt has been increasing her time alone at home up to a few hours at a time to become more independent. Pt has both manual and power WC. Uses power WC at home, uses manual WC out in community. Pt able to self propel manual WC. Pt needs assist putting pants around  ankles but she can pull them up. Pt can put on her shoes but needs help getting her leg up to do so. Pt able to tie her shoelaces on her own. Pt has a tub/shower unit at home with a tub bench. No grab bars in the shower. Has hand held shower head. Pt currently taking a sponge bath. Pt has a regular height toilet with a BSC over top. Pt with h/o L wrist fx with no precautions, pt wore her soft brace to evaluation for comfort but reports her wrist has healed and is doing well following OT.     Currently in Pain?  No/denies           in // bars    Standing weight shift with foot clearing BLE: 2 minutes   In // bars: large step forward 8x each LE. BUE support; on seated rest break between LE's.    Band across // bars mid thigh height: tactile/visual cue for knee/hip flexion for foot clearance, knee band 6x each LE.   Standing hip extension 8x each LE; able to lift LLE off ground/clearing ground.     Sit to stand technique with UE support, decreased reliance upon UE's with improved eccentric control, no "plopping". Multiple performances throughout session    Seated:  2000 gr weight Arc of motion from one knee to another 10x  GTB straight arm lat pull down 10x GTB row 15x  RTB ER focus on upirght posture    Cross body jabs with increasing difficulty and range, increasing challenge to L, R, duck for core stability and postural reactions. -Jab L , R duck for cross body coordination and postural control/righting reactions 10x -Jab R, L duck  for cross body coordination and postural control/righting reactions 10x -Cross body elbow for abdominal strengthening and postural challenge x 60 seconds  seated isometric hip flexion against PT hand with upright posture and arms crossed 15x     Seated rest breaks and use of ice pack utilized to keep body temperature at functional range to reduce MS exacerbation.      Pt educated throughout session about proper posture and technique with exercises.  Improved exercise technique, movement at target joints, use of target muscles after min to mod verbal, visual, tactile cues.                        PT Education - 02/24/19 1234    Education provided  Yes    Education Details  exercise technique, body mechanics    Person(s) Educated  Patient    Methods  Explanation;Demonstration;Tactile cues;Verbal   cues    Comprehension  Verbalized understanding;Returned demonstration;Verbal cues required;Tactile cues required       PT Short Term Goals - 01/15/19 1241      PT SHORT TERM GOAL #1   Title  Patient will be compliant with HEP 4-5 days/week for improved carryover between sessions.     Baseline  HEP compliancy between sessions 6/17 compliant    Time  2    Period  Weeks    Status  Achieved    Target Date  10/15/18      PT SHORT TERM GOAL #2   Title  Patient will report no falls/LOB to indicate improved stability with mobility     Baseline  6/17: no falls, one episode of instability 7/20: no falls    Time  2    Period  Weeks    Status  Achieved    Target Date  11/06/18        PT Long Term Goals - 02/14/19 0001      PT LONG TERM GOAL #1   Title  Patient will perform TUG in 40 seconds or less with walker to improve safe negotiation of environment.     Baseline  10/9: deferred due to pain 5/26: 61 seconds 6/17: 60 seconds 7/20: 59 seconds 8/26:  55 seconds    Time  8    Period  Weeks    Status  Partially Met    Target Date  03/12/19      PT LONG TERM GOAL #2   Title  Pt's 33mT will improve to at least 0.5 m/s for improved safety ambulating in home    Baseline  0.14 m/s; 5/15: .248m  7/10: .36 m/s  8/20: .42 m/s  9/26: 0.2661m10/14: .26 m/s improved hip and knee clearance 11/15: 0.40m62mmproved hip and knee clearance; 12/5: .29 m/s improved step length and forward moment: decreased foot drag. 05/09/2018: 0.21 m/s 2/6: .237 m/s 5/26: .23 m/s 6/17: .24 m/s  7/20: 0.26 m/s 8/26: 0.26 m/s 10/8: 0.28 m/s    Time  8     Period  Weeks    Status  Partially Met    Target Date  03/12/19      PT LONG TERM GOAL #3   Title  Pt's Bil hip F strength will improve to at least 2+/5 BLEs for improved gait mechanics and safety    Baseline  10/8: deferred due to pain L: 2/5, R: 1/5 7/10: 2/5 bilaterally  8/20: 2/5 bilat 9/26: 2/5 bilat 10/14: 2/5 bilaterally ,improved hip flexor muscle activation 2/5: improved hip flexor and quad activation: 12/5: 2/5 ; 05/09/2018: 2/5 2/6: 3/5 knee, 2 to 2+/5 hip 5/26: 2-/5 hips 3/5 knees 6/17: 2-/5 hips 3/5 knees    Time  8    Period  Weeks    Status  Partially Met    Target Date  03/12/19      PT LONG TERM GOAL #4   Title  Pt's ABC Scale will improve to at least 40% to demonstrate improved balance    Baseline  10/9: 32.1% 28.13% 5/15; 24.4% 7/10: 23.4% 8/20: 22. 2% 9/26: 25.3% 10/14: 26.5% 11/15: 24.38% 12/5: 26%; 05/09/2018: 29% 2/6: 23.75% 526: 30% 6/17: 26.8%  7/2: 28% 8/26: 30.3%    Time  8    Period  Weeks    Status  Partially Met    Target Date  03/12/19      PT LONG TERM GOAL #5   Title  Patient will  negotiate one step ~4 inches to enter house independently to increase functional independence in natural environment    Baseline  10/9: has lifted 1.5 inch in previous sessions /20: unable to do 9/26: patient too fatigued/not feeling well to perform 10/14: unable to perform at this time 11/15: unable to perform at this time; 12/5: able to lift 1 inch; 05/09/2018: unable to clear 4" step, able to lift foot ~ 1in 5/26: has been unable to perform 6/17: able to lift foot 1.25 inch 8/26: has been able to step up onto 1.5 inch step in previous sessions    Time  8    Period  Weeks    Status  Partially Met    Target Date  03/12/19      PT LONG TERM GOAL #6   Title  Patient will negotiate 30 ft of changing surfaces outside such as pavement and parking lot to allow patient to walk into church.    Baseline  10/9: walks 20 ft outside at store now 12/5: does not walk outside 2/24: unable to  test due to rain 5/26: unable since COVID 6/17: raining 7/20 too hot to test 8/26: unable to walk outside due to heat and COVID    Time  8    Period  Weeks    Status  Partially Met    Target Date  03/12/19      PT LONG TERM GOAL #7   Title  Patient will stand 5 minutes to increase standing duration to allow patient to return to washing dishes and clothes.     Baseline  10/9: deferred due to pain 12/5: 56 seconds; 05/09/2018: 1 min 18 sec 2/6: 2 minutes last session 5/26: 1 minute 36 seconds limited by breathing 6/17: terminated due to abdomen pain  7/20 terminated due to overheating 8/26: able to stand 3 minutes at home    Time  8    Period  Weeks    Status  Partially Met    Target Date  03/12/19            Plan - 02/24/19 1239    Clinical Impression Statement  Patient tolerated session well this morning with average fatigue noted with prolonged standing interventions. Patient is activating posterior chain muscles with improved quality and amplitude, lifting LLE off ground for first time this session. Patient's core is limited in prolonged contraction and will benefit from continued strengthening. The patient will continue to benefit from skilled therapeutic intervention to address deficits in strength, mobility, balance, and function for improved overall QOL and safety    Rehab Potential  Good    PT Frequency  2x / week    PT Duration  8 weeks    PT Treatment/Interventions  ADLs/Self Care Home Management;Aquatic Therapy;Biofeedback;Cryotherapy;Electrical Stimulation;Iontophoresis 43m/ml Dexamethasone;Traction;Moist Heat;Ultrasound;DME Instruction;Gait training;Stair training;Functional mobility training;Therapeutic activities;Therapeutic exercise;Balance training;Neuromuscular re-education;Patient/family education;Orthotic Fit/Training;Wheelchair mobility training;Manual techniques;Compression bandaging;Passive range of motion;Dry needling;Energy conservation;Taping;Splinting    PT Next  Visit Plan  strength/balance    PT Home Exercise Plan  to initiate next session (please incorporate hip flexor and adductor stretches in HEP if possible)    Consulted and Agree with Plan of Care  Patient       Patient will benefit from skilled therapeutic intervention in order to improve the following deficits and impairments:  Abnormal gait, Decreased activity tolerance, Decreased balance, Decreased coordination, Decreased endurance, Decreased knowledge of use of DME, Decreased mobility, Decreased range of motion, Decreased safety awareness, Decreased strength, Difficulty walking, Impaired perceived functional  ability, Impaired sensation, Impaired tone, Impaired UE functional use, Improper body mechanics, Postural dysfunction  Visit Diagnosis: Muscle weakness (generalized)  Other abnormalities of gait and mobility  Unsteadiness on feet     Problem List Patient Active Problem List   Diagnosis Date Noted  . Abscess of female pelvis   . SVT (supraventricular tachycardia) ([place])   . Radial styloid tenosynovitis 03/12/2018  . Wheelchair confinement 02/27/2018  . Localized osteoporosis with current pathological fracture with routine healing 01/19/2017  . Wrist fracture 01/16/2017  . Sprain of ankle 03/23/2016  . Closed fracture of lateral malleolus 03/16/2016  . Health care maintenance 01/24/2016  . Blood pressure elevated without history of HTN 10/25/2015  . Essential hypertension 10/25/2015  . Multiple sclerosis ([place]) 10/02/2015  . Chronic left shoulder pain 07/19/2015  . Multiple sclerosis exacerbation ([place]) 07/14/2015  . MS (multiple sclerosis) ([place]) 11/26/2014  . Increased body mass index 11/26/2014  . HPV test positive 11/26/2014  . Status post laparoscopic supracervical hysterectomy 11/26/2014  . Galactorrhea 11/26/2014  . Back ache 05/21/2014  . Adiposity 05/21/2014  . Disordered sleep 05/21/2014  . Muscle spasticity 05/21/2014  . Spasticity 05/21/2014  . Calculus of  kidney 12/09/2013  . Renal colic 61/47/0929  . Hypercholesteremia 08/19/2013  . Hereditary and idiopathic neuropathy 08/19/2013  . Hypercholesterolemia without hypertriglyceridemia 08/19/2013  . Bladder infection, chronic 07/25/2012  . Disorder of bladder function 07/25/2012  . Incomplete bladder emptying 07/25/2012  . Microscopic hematuria 07/25/2012  . Right upper quadrant pain 07/25/2012   Janna Arch, PT, DPT   02/24/2019, 12:40 PM  [place] [facility] [address] [place], [place], [address] Phone: [phone]   Fax:  [phone]  Name: Lisa Williams MRN: [id number] Date of Birth: 11-18-1960

## 2019-02-25 ENCOUNTER — Encounter: Payer: Self-pay | Admitting: Pharmacy Technician

## 2019-02-25 NOTE — Progress Notes (Signed)
Patient has been approved for drug assistance by [place] for Rituxan. The enrollment period is from 02/27/19-02/27/20 based on off label. First DOS covered is 02/28/19.

## 2019-02-26 ENCOUNTER — Other Ambulatory Visit: Payer: Self-pay

## 2019-02-26 ENCOUNTER — Ambulatory Visit: Payer: Medicare HMO

## 2019-02-26 DIAGNOSIS — R2689 Other abnormalities of gait and mobility: Secondary | ICD-10-CM

## 2019-02-26 DIAGNOSIS — R2681 Unsteadiness on feet: Secondary | ICD-10-CM

## 2019-02-26 DIAGNOSIS — M6281 Muscle weakness (generalized): Secondary | ICD-10-CM

## 2019-02-26 NOTE — Therapy (Signed)
[place] MAIN [facility] SERVICES [place], [place], [address] Phone: [phone]   Fax:  [phone]  Physical Therapy Treatment  Patient Details  Name: Lisa Williams MRN: [id number] Date of Birth: [birth year] Referring Provider (PT): Gurney Maxin, MD   Encounter Date: 02/26/2019  PT End of Session - 02/26/19 1229    Visit Number  [id number]    Number of Visits  129    Date for PT Re-Evaluation  03/12/19    Authorization Type  3/10 PN 10/8    PT Start Time  1030    PT Stop Time  1110    PT Time Calculation (min)  40 min    Equipment Utilized During Treatment  Gait belt    Activity Tolerance  Patient tolerated treatment well   heat intolerance   Behavior During Therapy  [facility] for tasks assessed/performed       Past Medical History:  Diagnosis Date  . Abdominal pain, right upper quadrant   . Back pain   . Calculus of kidney 12/09/2013  . Chronic back pain    unspecified  . Chronic left shoulder pain 07/19/2015  . Functional disorder of bladder    other  . Galactorrhea 11/26/2014   Chronic   . Hereditary and idiopathic neuropathy 08/19/2013  . HPV test positive   . Hypercholesteremia 08/19/2013  . Incomplete bladder emptying   . Microscopic hematuria   . MS (multiple sclerosis) ([place])   . Muscle spasticity 05/21/2014  . Nonspecific findings on examination of urine    other  . Osteopenia   . Status post laparoscopic supracervical hysterectomy 11/26/2014  . Tobacco user 11/26/2014  . Wrist fracture     Past Surgical History:  Procedure Laterality Date  . bilateral tubal ligation  1996  . BREAST CYST EXCISION Left 2002  . KNEE SURGERY     right  . LAPAROSCOPIC SUPRACERVICAL HYSTERECTOMY  08/05/2013  . ORIF WRIST FRACTURE Left 01/17/2017   Procedure: OPEN REDUCTION INTERNAL FIXATION (ORIF) WRIST FRACTURE;  Surgeon: Lovell Sheehan, MD;  Location: [facility] ORS;  Service: Orthopedics;  Laterality: Left;  . TUBAL LIGATION  Bilateral   . VAGINAL HYSTERECTOMY  03/2006    There were no vitals filed for this visit.  Subjective Assessment - 02/26/19 1228    Subjective  Patient reports today is a good day. No falls or LOB since last session.    Pertinent History  Pt is a 58 y/o F who presents with BLE weakness and difficulty ambulating due to h/o MS that was diagnosed in 1995. Pt denies any pain associated. Pt was admitted to the hospital in November 2018 due to MS exacerbation. Nov-Dec to HHPT-->[facility] until end of Jan. After rehab the pt was able to ambulate up to 100 ft with the RW. Pt has Bil AFO, her therapist at [place] thinks she needs a new AFO as her feet still drag when she ambulates, pt reports HHPT mentioned a spring AFO. Pt has custom AFOs that were originally made 4 years ago. Goals: improve balance, to be able to get into the shower with her tub bench without assist, to be able to get up 2 steps at sister's home with railings on both sides but too far apart to reach both sides, to be able to drive adapted car. Pt is staying at other sister's house with one step to get inside and currently picking up L leg with UEs and then stepping in. Pt steps down  leading with LLE. Pt wants to work on walking side to side as this is challenging and she had not yet progressed to this at rehab. Pt able to get in/out the car with assist only to bring BLEs into the car. Hoping to drive by June or July with adapted car (already has one). Pt needs assist bringing LEs into and out of hospital bed. Pt able to perform sit>stand without assist from [facility] with a pillow on the seat for a boost. Pt denies any falls in the past 6 months. Pt has been increasing her time alone at home up to a few hours at a time to become more independent. Pt has both manual and power WC. Uses power WC at home, uses manual WC out in community. Pt able to self propel manual WC. Pt needs assist putting pants around ankles but she can pull them up. Pt can put  on her shoes but needs help getting her leg up to do so. Pt able to tie her shoelaces on her own. Pt has a tub/shower unit at home with a tub bench. No grab bars in the shower. Has hand held shower head. Pt currently taking a sponge bath. Pt has a regular height toilet with a BSC over top. Pt with h/o L wrist fx with no precautions, pt wore her soft brace to evaluation for comfort but reports her wrist has healed and is doing well following OT.     Currently in Pain?  No/denies          Standing with RW: Large step forward, rocking with preswing, initial contact swing x 6 each side  Ambulate with RW and CGA, first trial 25 ft, second trial 40 ft, with wheelchair follow. Cueing for weight shift onto RLE with L hip flexion for gait mechanics and L foot clearance.   seated:  weighted ball (3000 gr)  -seated toss with PT for pertuation and righting reactions 60 seconds -chest press seated 15x  -overhead press 15x -arc of motion 10x -modified forward and backwards upper body crunches 10x  -LAQ 10x each LE, modified with AAROM RLE  -hip flexion isometrics 10x 5 second holds each LE    Seated rest breaks and use of ice pack utilized to keep body temperature at functional range to reduce MS exacerbation.      Pt educated throughout session about proper posture and technique with exercises. Improved exercise technique, movement at target joints, use of target muscles after min to mod verbal, visual, tactile cues.                PT Education - 02/26/19 1228    Education provided  Yes    Education Details  exercise technique, body mechanics    Person(s) Educated  Patient    Methods  Explanation;Demonstration;Tactile cues;Verbal cues    Comprehension  Verbalized understanding;Returned demonstration;Verbal cues required;Tactile cues required       PT Short Term Goals - 01/15/19 1241      PT SHORT TERM GOAL #1   Title  Patient will be compliant with HEP 4-5 days/week for  improved carryover between sessions.     Baseline  HEP compliancy between sessions 6/17 compliant    Time  2    Period  Weeks    Status  Achieved    Target Date  10/15/18      PT SHORT TERM GOAL #2   Title  Patient will report no falls/LOB to indicate improved stability with mobility  Baseline  6/17: no falls, one episode of instability 7/20: no falls    Time  2    Period  Weeks    Status  Achieved    Target Date  11/06/18        PT Long Term Goals - 02/14/19 0001      PT LONG TERM GOAL #1   Title  Patient will perform TUG in 40 seconds or less with walker to improve safe negotiation of environment.     Baseline  10/9: deferred due to pain 5/26: 61 seconds 6/17: 60 seconds 7/20: 59 seconds 8/26:  55 seconds    Time  8    Period  Weeks    Status  Partially Met    Target Date  03/12/19      PT LONG TERM GOAL #2   Title  Pt's 6mT will improve to at least 0.5 m/s for improved safety ambulating in home    Baseline  0.14 m/s; 5/15: .264m  7/10: .36 m/s  8/20: .42 m/s  9/26: 0.2640m10/14: .26 m/s improved hip and knee clearance 11/15: 0.69m41mmproved hip and knee clearance; 12/5: .29 m/s improved step length and forward moment: decreased foot drag. 05/09/2018: 0.21 m/s 2/6: .237 m/s 5/26: .23 m/s 6/17: .24 m/s  7/20: 0.26 m/s 8/26: 0.26 m/s 10/8: 0.28 m/s    Time  8    Period  Weeks    Status  Partially Met    Target Date  03/12/19      PT LONG TERM GOAL #3   Title  Pt's Bil hip F strength will improve to at least 2+/5 BLEs for improved gait mechanics and safety    Baseline  10/8: deferred due to pain L: 2/5, R: 1/5 7/10: 2/5 bilaterally  8/20: 2/5 bilat 9/26: 2/5 bilat 10/14: 2/5 bilaterally ,improved hip flexor muscle activation 2/5: improved hip flexor and quad activation: 12/5: 2/5 ; 05/09/2018: 2/5 2/6: 3/5 knee, 2 to 2+/5 hip 5/26: 2-/5 hips 3/5 knees 6/17: 2-/5 hips 3/5 knees    Time  8    Period  Weeks    Status  Partially Met    Target Date  03/12/19      PT LONG  TERM GOAL #4   Title  Pt's ABC Scale will improve to at least 40% to demonstrate improved balance    Baseline  10/9: 32.1% 28.13% 5/15; 24.4% 7/10: 23.4% 8/20: 22. 2% 9/26: 25.3% 10/14: 26.5% 11/15: 24.38% 12/5: 26%; 05/09/2018: 29% 2/6: 23.75% 526: 30% 6/17: 26.8%  7/2: 28% 8/26: 30.3%    Time  8    Period  Weeks    Status  Partially Met    Target Date  03/12/19      PT LONG TERM GOAL #5   Title  Patient will negotiate one step ~4 inches to enter house independently to increase functional independence in natural environment    Baseline  10/9: has lifted 1.5 inch in previous sessions /20: unable to do 9/26: patient too fatigued/not feeling well to perform 10/14: unable to perform at this time 11/15: unable to perform at this time; 12/5: able to lift 1 inch; 05/09/2018: unable to clear 4" step, able to lift foot ~ 1in 5/26: has been unable to perform 6/17: able to lift foot 1.25 inch 8/26: has been able to step up onto 1.5 inch step in previous sessions    Time  8    Period  Weeks    Status  Partially Met  Target Date  03/12/19      PT LONG TERM GOAL #6   Title  Patient will negotiate 30 ft of changing surfaces outside such as pavement and parking lot to allow patient to walk into church.    Baseline  10/9: walks 20 ft outside at store now 12/5: does not walk outside 2/24: unable to test due to rain 5/26: unable since COVID 6/17: raining 7/20 too hot to test 8/26: unable to walk outside due to heat and COVID    Time  8    Period  Weeks    Status  Partially Met    Target Date  03/12/19      PT LONG TERM GOAL #7   Title  Patient will stand 5 minutes to increase standing duration to allow patient to return to washing dishes and clothes.     Baseline  10/9: deferred due to pain 12/5: 56 seconds; 05/09/2018: 1 min 18 sec 2/6: 2 minutes last session 5/26: 1 minute 36 seconds limited by breathing 6/17: terminated due to abdomen pain  7/20 terminated due to overheating 8/26: able to stand 3 minutes at  home    Time  8    Period  Weeks    Status  Partially Met    Target Date  03/12/19            Plan - 02/26/19 1236    Clinical Impression Statement  Patient continues to demonstrate excellent motivation throughout session. Initially she is challenged with LLE foot clearance on carpet however with tactile and verbal cueing is able to perform more efficient weight shift for offloading and clearance. The patient will continue to benefit from skilled therapeutic intervention to address deficits in strength, mobility, balance, and function for improved overall QOL and safety    Rehab Potential  Good    PT Frequency  2x / week    PT Duration  8 weeks    PT Treatment/Interventions  ADLs/Self Care Home Management;Aquatic Therapy;Biofeedback;Cryotherapy;Electrical Stimulation;Iontophoresis 48m/ml Dexamethasone;Traction;Moist Heat;Ultrasound;DME Instruction;Gait training;Stair training;Functional mobility training;Therapeutic activities;Therapeutic exercise;Balance training;Neuromuscular re-education;Patient/family education;Orthotic Fit/Training;Wheelchair mobility training;Manual techniques;Compression bandaging;Passive range of motion;Dry needling;Energy conservation;Taping;Splinting    PT Next Visit Plan  strength/balance    PT Home Exercise Plan  to initiate next session (please incorporate hip flexor and adductor stretches in HEP if possible)    Consulted and Agree with Plan of Care  Patient       Patient will benefit from skilled therapeutic intervention in order to improve the following deficits and impairments:  Abnormal gait, Decreased activity tolerance, Decreased balance, Decreased coordination, Decreased endurance, Decreased knowledge of use of DME, Decreased mobility, Decreased range of motion, Decreased safety awareness, Decreased strength, Difficulty walking, Impaired perceived functional ability, Impaired sensation, Impaired tone, Impaired UE functional use, Improper body mechanics,  Postural dysfunction  Visit Diagnosis: Muscle weakness (generalized)  Other abnormalities of gait and mobility  Unsteadiness on feet     Problem List Patient Active Problem List   Diagnosis Date Noted  . Abscess of female pelvis   . SVT (supraventricular tachycardia) ([place]   . Radial styloid tenosynovitis 03/12/2018  . Wheelchair confinement 02/27/2018  . Localized osteoporosis with current pathological fracture with routine healing 01/19/2017  . Wrist fracture 01/16/2017  . Sprain of ankle 03/23/2016  . Closed fracture of lateral malleolus 03/16/2016  . Health care maintenance 01/24/2016  . Blood pressure elevated without history of HTN 10/25/2015  . Essential hypertension 10/25/2015  . [place] 10/02/2015  . Chronic left  shoulder pain 07/19/2015  . Multiple sclerosis exacerbation ([place]) 07/14/2015  . MS (multiple sclerosis) ([place]) 11/26/2014  . Increased body mass index 11/26/2014  . HPV test positive 11/26/2014  . Status post laparoscopic supracervical hysterectomy 11/26/2014  . Galactorrhea 11/26/2014  . Back ache 05/21/2014  . Adiposity 05/21/2014  . Disordered sleep 05/21/2014  . Muscle spasticity 05/21/2014  . Spasticity 05/21/2014  . Calculus of kidney 12/09/2013  . Renal colic 37/48/2707  . Hypercholesteremia 08/19/2013  . Hereditary and idiopathic neuropathy 08/19/2013  . Hypercholesterolemia without hypertriglyceridemia 08/19/2013  . Bladder infection, chronic 07/25/2012  . Disorder of bladder function 07/25/2012  . Incomplete bladder emptying 07/25/2012  . Microscopic hematuria 07/25/2012  . Right upper quadrant pain 07/25/2012    Janna Arch, PT, DPT   02/26/2019, 12:43 PM  [place] MAIN [facility] SERVICES [address] [place], [place], [address] Phone: [phone]   Fax:  [phone]  Name: Lisa Williams MRN: [id number] Date of Birth: 1960-11-29

## 2019-02-28 ENCOUNTER — Other Ambulatory Visit: Payer: Self-pay

## 2019-02-28 ENCOUNTER — Other Ambulatory Visit: Payer: Self-pay | Admitting: Oncology

## 2019-02-28 ENCOUNTER — Encounter: Payer: Self-pay | Admitting: Oncology

## 2019-02-28 ENCOUNTER — Inpatient Hospital Stay: Payer: Medicare HMO

## 2019-02-28 ENCOUNTER — Inpatient Hospital Stay: Payer: Medicare HMO | Attending: Oncology

## 2019-02-28 ENCOUNTER — Inpatient Hospital Stay (HOSPITAL_BASED_OUTPATIENT_CLINIC_OR_DEPARTMENT_OTHER): Payer: Medicare HMO | Admitting: Oncology

## 2019-02-28 VITALS — BP 145/71 | HR 64 | Temp 97.4°F | Resp 18 | Wt 274.0 lb

## 2019-02-28 VITALS — BP 154/73 | HR 83 | Temp 97.4°F | Resp 18

## 2019-02-28 DIAGNOSIS — Z803 Family history of malignant neoplasm of breast: Secondary | ICD-10-CM | POA: Diagnosis not present

## 2019-02-28 DIAGNOSIS — Z5112 Encounter for antineoplastic immunotherapy: Secondary | ICD-10-CM | POA: Diagnosis not present

## 2019-02-28 DIAGNOSIS — G35 Multiple sclerosis: Secondary | ICD-10-CM | POA: Insufficient documentation

## 2019-02-28 DIAGNOSIS — Z8042 Family history of malignant neoplasm of prostate: Secondary | ICD-10-CM | POA: Diagnosis not present

## 2019-02-28 DIAGNOSIS — M8580 Other specified disorders of bone density and structure, unspecified site: Secondary | ICD-10-CM | POA: Insufficient documentation

## 2019-02-28 DIAGNOSIS — E7800 Pure hypercholesterolemia, unspecified: Secondary | ICD-10-CM | POA: Insufficient documentation

## 2019-02-28 DIAGNOSIS — Z8041 Family history of malignant neoplasm of ovary: Secondary | ICD-10-CM | POA: Insufficient documentation

## 2019-02-28 DIAGNOSIS — Z79899 Other long term (current) drug therapy: Secondary | ICD-10-CM | POA: Diagnosis not present

## 2019-02-28 DIAGNOSIS — F17210 Nicotine dependence, cigarettes, uncomplicated: Secondary | ICD-10-CM | POA: Diagnosis not present

## 2019-02-28 LAB — COMPREHENSIVE METABOLIC PANEL
ALT: 26 U/L (ref 0–44)
AST: 23 U/L (ref 15–41)
Albumin: 3.8 g/dL (ref 3.5–5.0)
Alkaline Phosphatase: 101 U/L (ref 38–126)
Anion gap: 7 (ref 5–15)
BUN: 21 mg/dL — ABNORMAL HIGH (ref 6–20)
CO2: 24 mmol/L (ref 22–32)
Calcium: 9 mg/dL (ref 8.9–10.3)
Chloride: 109 mmol/L (ref 98–111)
Creatinine, Ser: 0.67 mg/dL (ref 0.44–1.00)
GFR calc Af Amer: >60 mL/min (ref >60)
GFR calc non Af Amer: >60 mL/min (ref >60)
Glucose, Bld: 96 mg/dL (ref 70–99)
Potassium: 4.3 mmol/L (ref 3.5–5.1)
Sodium: 140 mmol/L (ref 135–145)
Total Bilirubin: 0.7 mg/dL (ref 0.3–1.2)
Total Protein: 7 g/dL (ref 6.5–8.1)

## 2019-02-28 LAB — CBC WITH DIFFERENTIAL/PLATELET
Abs Immature Granulocytes: 0 10*3/uL (ref 0.00–0.07)
Basophils Absolute: 0 10*3/uL (ref 0.0–0.1)
Basophils Relative: 0 %
Eosinophils Absolute: 0.1 10*3/uL (ref 0.0–0.5)
Eosinophils Relative: 1 %
HCT: 37.7 % (ref 36.0–46.0)
Hemoglobin: 13.1 g/dL (ref 12.0–15.0)
Lymphocytes Relative: 35 %
Lymphs Abs: 2.4 10*3/uL (ref 0.7–4.0)
MCH: 26 pg (ref 26.0–34.0)
MCHC: 34.7 g/dL (ref 30.0–36.0)
MCV: 74.8 fL — ABNORMAL LOW (ref 80.0–100.0)
Monocytes Absolute: 0.4 10*3/uL (ref 0.1–1.0)
Monocytes Relative: 6 %
Neutro Abs: 4 10*3/uL (ref 1.7–7.7)
Neutrophils Relative %: 58 %
Platelets: 235 10*3/uL (ref 150–400)
RBC: 5.04 MIL/uL (ref 3.87–5.11)
RDW: 14.1 % (ref 11.5–15.5)
Smear Review: DECREASED
WBC: 6.9 10*3/uL (ref 4.0–10.5)
nRBC: 0 % (ref 0.0–0.2)

## 2019-02-28 MED ORDER — ACETAMINOPHEN 500 MG PO TABS
1000.0000 mg | ORAL_TABLET | Freq: Once | ORAL | Status: AC
  Administered 2019-02-28: 1000 mg via ORAL
  Filled 2019-02-28: qty 2

## 2019-02-28 MED ORDER — SODIUM CHLORIDE 0.9 % IV SOLN
425.0000 mg/m2 | Freq: Once | INTRAVENOUS | Status: AC
  Administered 2019-02-28: 1000 mg via INTRAVENOUS
  Filled 2019-02-28: qty 100

## 2019-02-28 MED ORDER — METHYLPREDNISOLONE SODIUM SUCC 125 MG IJ SOLR
100.0000 mg | Freq: Once | INTRAMUSCULAR | Status: AC
  Administered 2019-02-28: 100 mg via INTRAVENOUS
  Filled 2019-02-28: qty 2

## 2019-02-28 MED ORDER — SODIUM CHLORIDE 0.9 % IV SOLN: 425.0000 mg/m2 | Freq: Once | INTRAVENOUS | Status: DC

## 2019-02-28 MED ORDER — SODIUM CHLORIDE 0.9 % IV SOLN
Freq: Once | INTRAVENOUS | Status: AC
  Administered 2019-02-28: 10:00:00 via INTRAVENOUS
  Filled 2019-02-28: qty 250

## 2019-02-28 MED ORDER — DIPHENHYDRAMINE HCL 25 MG PO CAPS
50.0000 mg | ORAL_CAPSULE | Freq: Once | ORAL | Status: AC
  Administered 2019-02-28: 50 mg via ORAL
  Filled 2019-02-28: qty 2

## 2019-02-28 NOTE — Addendum Note (Signed)
Addended by: Faythe Casa E on: 02/28/2019 10:10 AM   Modules accepted: Orders

## 2019-02-28 NOTE — Progress Notes (Signed)
Pt tolerated infusion well. PT and VS stable at discharge.

## 2019-02-28 NOTE — Progress Notes (Signed)
[facility]-  Cancer Center  Clinic day:  02/28/2019  Chief Complaint: Lisa Williams is a 58 y.o. female with multiple sclerosis who seen for 6 month assessment prior to next cycle Rituxan.  HPI:  Patient was last seen in the hematology clinic on 06/12/2018.  At that time, she continued to have lower extremity weakness but improving.  She was back at home participating in outpatient physical therapy.  She was walking.  She denied any MS related symptoms at present.  Her strength and coordination continue to improve daily. She received Rituxin.   CBC on 04/19/2018 revealed hematocrit 36.9, hemoglobin 12.7, MCV 76.7, platelets 259, WBC 8000.  CMP was stable.  During the interim, patient continues to improve.  She participates in twice weekly outpatient physical therapy.  She continues to ambulate with the assistance of a walker/cane.  She denies any MS related symptoms at this time.  Her strength and coordination are continuing to improve.   She was evaluated by Dr. Malvin JohnsPotter on 12/30/2018.  He increased her rituximab to 1000 mg infusions beginning in October 2020.  Following infusions will be in 12 months.  Instructed her to start gabapentin 100 mg twice daily for 2 weeks then increase to 200 mg twice daily and continue.  She has history of neurogenic bladder and is currently stable on oxybutynin 10 mg daily.  She denies any B symptoms, infections and maintains an adequate appetite.  She is eating well.  Today's weight is 274 pounds which is a 5 pound increase.  She denies any pain.   Past Medical History:  Diagnosis Date  . Abdominal pain, right upper quadrant   . Back pain   . Calculus of kidney 12/09/2013  . Chronic back pain    unspecified  . Chronic left shoulder pain 07/19/2015  . Functional disorder of bladder    other  . Galactorrhea 11/26/2014   Chronic   . Hereditary and idiopathic neuropathy 08/19/2013  . HPV test positive   . Hypercholesteremia  08/19/2013  . Incomplete bladder emptying   . Microscopic hematuria   . MS (multiple sclerosis) (HCC)   . Muscle spasticity 05/21/2014  . Nonspecific findings on examination of urine    other  . Osteopenia   . Status post laparoscopic supracervical hysterectomy 11/26/2014  . Tobacco user 11/26/2014  . Wrist fracture     Past Surgical History:  Procedure Laterality Date  . bilateral tubal ligation  1996  . BREAST CYST EXCISION Left 2002  . KNEE SURGERY     right  . LAPAROSCOPIC SUPRACERVICAL HYSTERECTOMY  08/05/2013  . ORIF WRIST FRACTURE Left 01/17/2017   Procedure: OPEN REDUCTION INTERNAL FIXATION (ORIF) WRIST FRACTURE;  Surgeon: Lyndle HerrlichBowers, James R, MD;  Location: [facility] ORS;  Service: Orthopedics;  Laterality: Left;  . TUBAL LIGATION Bilateral   . VAGINAL HYSTERECTOMY  03/2006    Family History  Problem Relation Age of Onset  . Sickle cell trait Sister   . Hypertension Sister   . Supraventricular tachycardia Sister   . Hypertension Sister   . Diabetes Maternal Grandmother   . Liver cancer Maternal Grandmother   . Diabetes Maternal Aunt   . Breast cancer Maternal Aunt        great MAT  . Ovarian cancer Paternal Grandmother   . Nephrolithiasis Father   . Hypertension Father   . Prostate cancer Father   . Kidney disease Father   . Heart failure Father   . Hypertension Sister   .  Osteoarthritis Mother   . Hypertension Mother   . Hyperlipidemia Mother   . GU problems Neg Hx   . Urolithiasis Neg Hx     Social History:  reports that she has quit smoking. Her smoking use included cigarettes. She smoked 0.50 packs per day. She has never used smokeless tobacco. She reports that she does not drink alcohol or use drugs.  She lives in [place].  She is disabled secondary to her multiple sclerosis.  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  She has been in the [facility] since [facility]/2017.  The patient is alone today.  Allergies:   Allergies  Allergen Reactions  . Amlodipine Other (See Comments)    edema  . Povidone Iodine Rash    Current Medications: Current Outpatient Medications  Medication Sig Dispense Refill  . baclofen (LIORESAL) 20 MG tablet Take 20 mg by mouth 3 (three) times daily.     . Cholecalciferol (EQL VITAMIN D3) 25 MCG (1000 UT) tablet Take 2,000 Units by mouth daily.    Marland Kitchen. oxybutynin (DITROPAN) 5 MG tablet Take 1 tablet (5 mg total) by mouth 2 (two) times daily. 180 tablet 3  . telmisartan (MICARDIS) 40 MG tablet Take 40 mg by mouth daily.     No current facility-administered medications for this visit.     Review of Systems  Constitutional: Negative.  Negative for chills, fever, malaise/fatigue and weight loss.  HENT: Negative for congestion, ear pain and tinnitus.   Eyes: Negative.  Negative for blurred vision and double vision.  Respiratory: Negative.  Negative for cough, sputum production and shortness of breath.   Cardiovascular: Negative.  Negative for chest pain, palpitations and leg swelling.  Gastrointestinal: Negative.  Negative for abdominal pain, constipation, diarrhea, nausea and vomiting.  Genitourinary: Negative for dysuria, frequency and urgency.  Musculoskeletal: Negative for back pain and falls.  Skin: Negative.  Negative for rash.  Neurological: Positive for weakness (Lower extremity). Negative for headaches.  Endo/Heme/Allergies: Negative.  Does not bruise/bleed easily.  Psychiatric/Behavioral: Negative.  Negative for depression. The patient is not nervous/anxious and does not have insomnia.    Performance status (ECOG): 3 - Symptomatic, >50% confined to bed  Physical Exam: Physical Exam Constitutional:      Appearance: Normal appearance.     Comments: In wheelchair  HENT:     Head: Normocephalic and atraumatic.  Eyes:     Pupils: Pupils are equal, round, and reactive to light.  Neck:     Musculoskeletal: Normal range of motion.  Cardiovascular:     Rate and  Rhythm: Normal rate and regular rhythm.     Heart sounds: Normal heart sounds. No murmur.  Pulmonary:     Effort: Pulmonary effort is normal.     Breath sounds: Normal breath sounds. No wheezing.  Abdominal:     General: Bowel sounds are normal. There is no distension.     Palpations: Abdomen is soft.     Tenderness: There is no abdominal tenderness.  Musculoskeletal: Normal range of motion.  Skin:    General: Skin is warm and dry.     Findings: No rash.  Neurological:     Mental Status: She is alert and oriented to person, place, and time.  Psychiatric:        Judgment: Judgment normal.    There were no vitals taken for this visit. Lifestyle  Physical activity  . Days per week: Not on file  . Minutes per  session: Not on file     No visits with results within 3 Day(s) from this visit.  Latest known visit with results is:  Admission on 04/15/2018, Discharged on 04/19/2018  Component Date Value Ref Range Status  . WBC 04/15/2018 11.0* 4.0 - 10.5 K/uL Final  . RBC 04/15/2018 5.08  3.87 - 5.11 MIL/uL Final  . Hemoglobin 04/15/2018 13.4  12.0 - 15.0 g/dL Final  . HCT 04/15/2018 38.7  36.0 - 46.0 % Final  . MCV 04/15/2018 76.2* 80.0 - 100.0 fL Final  . MCH 04/15/2018 26.4  26.0 - 34.0 pg Final  . MCHC 04/15/2018 34.6  30.0 - 36.0 g/dL Final  . RDW 04/15/2018 13.9  11.5 - 15.5 % Final  . Platelets 04/15/2018 247  150 - 400 K/uL Final  . nRBC 04/15/2018 0.0  0.0 - 0.2 % Final  . Neutrophils Relative % 04/15/2018 75  % Final  . Neutro Abs 04/15/2018 8.3* 1.7 - 7.7 K/uL Final  . Lymphocytes Relative 04/15/2018 14  % Final  . Lymphs Abs 04/15/2018 1.5  0.7 - 4.0 K/uL Final  . Monocytes Relative 04/15/2018 9  % Final  . Monocytes Absolute 04/15/2018 1.0  0.1 - 1.0 K/uL Final  . Eosinophils Relative 04/15/2018 1  % Final  . Eosinophils Absolute 04/15/2018 0.1  0.0 - 0.5 K/uL Final  . Basophils Relative 04/15/2018 1  % Final  . Basophils Absolute 04/15/2018 0.1  0.0 - 0.1 K/uL  Final  . WBC Morphology 04/15/2018 MORPHOLOGY UNREMARKABLE   Final  . RBC Morphology 04/15/2018 MORPHOLOGY UNREMARKABLE   Final  . Smear Review 04/15/2018 Normal platelet morphology   Final  . Immature Granulocytes 04/15/2018 0  % Final  . Abs Immature Granulocytes 04/15/2018 0.04  0.00 - 0.07 K/uL Final   Performed at [facility], [address] [place], [place] [address]  . Sodium 04/15/2018 138  135 - 145 mmol/L Final  . Potassium 04/15/2018 4.2  3.5 - 5.1 mmol/L Final  . Chloride 04/15/2018 108  98 - 111 mmol/L Final  . CO2 04/15/2018 26  22 - 32 mmol/L Final  . Glucose, Bld 04/15/2018 114* 70 - 99 mg/dL Final  . BUN 04/15/2018 22* 6 - 20 mg/dL Final  . Creatinine, Ser 04/15/2018 0.73  0.44 - 1.00 mg/dL Final  . Calcium 04/15/2018 9.0  8.9 - 10.3 mg/dL Final  . Total Protein 04/15/2018 7.4  6.5 - 8.1 g/dL Final  . Albumin 04/15/2018 4.0  3.5 - 5.0 g/dL Final  . AST 04/15/2018 16  15 - 41 U/L Final  . ALT 04/15/2018 16  0 - 44 U/L Final  . Alkaline Phosphatase 04/15/2018 122  38 - 126 U/L Final  . Total Bilirubin 04/15/2018 0.5  0.3 - 1.2 mg/dL Final  . GFR calc non Af Amer 04/15/2018 >60  >60 mL/min Final  . GFR calc Af Amer 04/15/2018 >60  >60 mL/min Final  . Anion gap 04/15/2018 4* 5 - 15 Final   Performed at [facility], [address] [place], [place] [address]  . Color, Urine 04/15/2018 YELLOW* YELLOW Final  . APPearance 04/15/2018 CLEAR* CLEAR Final  . Specific Gravity, Urine 04/15/2018 1.026  1.005 - 1.030 Final  . pH 04/15/2018 5.0  5.0 - 8.0 Final  . Glucose, UA 04/15/2018 NEGATIVE  NEGATIVE mg/dL Final  . Hgb urine dipstick 04/15/2018 NEGATIVE  NEGATIVE Final  . Bilirubin Urine 04/15/2018 NEGATIVE  NEGATIVE Final  . Ketones, ur 04/15/2018 NEGATIVE  NEGATIVE  mg/dL Final  . Protein, ur 78/29/5621 NEGATIVE  NEGATIVE mg/dL Final  . Nitrite 30/86/5784 NEGATIVE  NEGATIVE Final  . Leukocytes, UA 04/15/2018 TRACE* NEGATIVE Final  . RBC / HPF  04/15/2018 0-5  0 - 5 RBC/hpf Final  . WBC, UA 04/15/2018 0-5  0 - 5 WBC/hpf Final  . Bacteria, UA 04/15/2018 NONE SEEN  NONE SEEN Final  . Squamous Epithelial / LPF 04/15/2018 0-5  0 - 5 Final   Performed at [facility], [address] [place], [place] [address]  . HIV Screen 4th Generation wRfx 04/17/2018 Non Reactive  Non Reactive Final   Comment: (NOTE) Performed At: [facility] [address] [place], [place] [id number] Jolene Schimke MD [id number]   . Weight 04/17/2018 3,920  oz Final  . Height 04/17/2018 71  in Final  . BP 04/17/2018 110/60  mmHg Final  . TSH 04/17/2018 0.442  0.350 - 4.500 uIU/mL Final   Comment: Performed by a 3rd Generation assay with a functional sensitivity of <=0.01 uIU/mL. Performed at [facility], [address] [place], [place] [address]   . Magnesium 04/17/2018 2.2  1.7 - 2.4 mg/dL Final   Performed at [facility], [address] [place], [place] [address]  . WBC 04/19/2018 8.0  4.0 - 10.5 K/uL Final  . RBC 04/19/2018 4.81  3.87 - 5.11 MIL/uL Final  . Hemoglobin 04/19/2018 12.7  12.0 - 15.0 g/dL Final  . HCT 38/75/6433 36.9  36.0 - 46.0 % Final  . MCV 04/19/2018 76.7* 80.0 - 100.0 fL Final  . MCH 04/19/2018 26.4  26.0 - 34.0 pg Final  . MCHC 04/19/2018 34.4  30.0 - 36.0 g/dL Final  . RDW 29/51/8841 13.9  11.5 - 15.5 % Final  . Platelets 04/19/2018 259  150 - 400 K/uL Final  . nRBC 04/19/2018 0.0  0.0 - 0.2 % Final   Performed at [facility], [address], [place], [place] [address]  . Sodium 04/19/2018 140  135 - 145 mmol/L Final  . Potassium 04/19/2018 3.8  3.5 - 5.1 mmol/L Final  . Chloride 04/19/2018 109  98 - 111 mmol/L Final  . CO2 04/19/2018 25  22 - 32 mmol/L Final  . Glucose, Bld 04/19/2018 97  70 - 99 mg/dL Final  . BUN 01/60/1093 20  6 - 20 mg/dL Final  . Creatinine, Ser 04/19/2018 0.64  0.44 - 1.00 mg/dL Final  . Calcium 23/55/7322 8.3* 8.9 - 10.3 mg/dL Final  . GFR calc  non Af Amer 04/19/2018 >60  >60 mL/min Final  . GFR calc Af Amer 04/19/2018 >60  >60 mL/min Final  . Anion gap 04/19/2018 6  5 - 15 Final   Performed at [facility], [address] [place], [place] [address]    Assessment:  Lisa Williams is a 58 y.o. female with multiple sclerosis diagnosed in 1995.  She was previously treated with Copaxone. . Hepatitis B core antibody total and hepatitis B surface antibody quantitative was negative on 07/14/2016.  Hepatitis testing on 12/16/2015 was negative for the following: Hepatitis B surface antigen, hepatitis Be antigen, hepatitis B core IgM antibody, hepatitis B core total antibody, hepatitis B E antibody, hepatitis B surface antibody, and hepatitis C antibody.  She has received Rituxan (09/06/2016 - current).  She had an MS flare and received Solumedrol 1000 mg daily x 5 (03/05/2017 - 03/09/2017).  She is currently resides at [facility].  Evaluated by Dr. [name] on  12/30/2018 and instructed to increase Rituxan to 1000 mg infusions.  Next infusion will be in 12 months.  Continue physical therapy.  Start gabapentin 100 mg twice daily for 2 weeks then increase to 200 mg twice daily.  Symptomatically, she has lower extremity weakness.  Exam reveals improvement in distal lower extremity strength since last visit.   Plan: 1.  1000 mg Rituxan today per Dr. [name]. 2.  RTC in 12 months for MD assessment, labs (CBC with diff, CMP) and Rituxan infusion.    Mauro Kaufmann, NP  02/28/2019, 8:45 AM

## 2019-03-03 ENCOUNTER — Other Ambulatory Visit: Payer: Self-pay

## 2019-03-03 ENCOUNTER — Ambulatory Visit: Payer: Medicare HMO

## 2019-03-03 DIAGNOSIS — R2681 Unsteadiness on feet: Secondary | ICD-10-CM

## 2019-03-03 DIAGNOSIS — M6281 Muscle weakness (generalized): Secondary | ICD-10-CM

## 2019-03-03 DIAGNOSIS — R2689 Other abnormalities of gait and mobility: Secondary | ICD-10-CM

## 2019-03-03 NOTE — Therapy (Signed)
[place] [facility] [place], [place], [address] Phone: [phone]   Fax:  [phone]  Physical Therapy Treatment  Patient Details  Name: Lisa Williams MRN: [id number] Date of Birth: 08-23-1960 Referring Provider (PT): Gurney Maxin, MD   Encounter Date: 03/03/2019  PT End of Session - 03/03/19 1237    Visit Number  [id number]    Number of Visits  129    Date for PT Re-Evaluation  03/12/19    Authorization Type  4/10 PN 10/8    PT Start Time  1017    PT Stop Time  1101    PT Time Calculation (min)  44 min    Equipment Utilized During Treatment  Gait belt    Activity Tolerance  Patient tolerated treatment well;Patient limited by fatigue   heat intolerance   Behavior During Therapy  [facility] for tasks assessed/performed       Past Medical History:  Diagnosis Date  . Abdominal pain, right upper quadrant   . Back pain   . Calculus of kidney 12/09/2013  . Chronic back pain    unspecified  . Chronic left shoulder pain 07/19/2015  . Functional disorder of bladder    other  . Galactorrhea 11/26/2014   Chronic   . Hereditary and idiopathic neuropathy 08/19/2013  . HPV test positive   . Hypercholesteremia 08/19/2013  . Incomplete bladder emptying   . Microscopic hematuria   . MS (multiple sclerosis) ([place])   . Muscle spasticity 05/21/2014  . Nonspecific findings on examination of urine    other  . Osteopenia   . Status post laparoscopic supracervical hysterectomy 11/26/2014  . Tobacco user 11/26/2014  . Wrist fracture     Past Surgical History:  Procedure Laterality Date  . bilateral tubal ligation  1996  . BREAST CYST EXCISION Left 2002  . KNEE SURGERY     right  . LAPAROSCOPIC SUPRACERVICAL HYSTERECTOMY  08/05/2013  . ORIF WRIST FRACTURE Left 01/17/2017   Procedure: OPEN REDUCTION INTERNAL FIXATION (ORIF) WRIST FRACTURE;  Surgeon: Lovell Sheehan, MD;  Location: [facility] ORS;  Service: Orthopedics;  Laterality:  Left;  . TUBAL LIGATION Bilateral   . VAGINAL HYSTERECTOMY  03/2006    There were no vitals filed for this visit.  Subjective Assessment - 03/03/19 1235    Subjective  Patient received her transfusion on Friday, was very fatigued for a few days. Has been compliant with HEP, no falls or LOB since last session.    Pertinent History  Pt is a 58 y/o F who presents with BLE weakness and difficulty ambulating due to h/o MS that was diagnosed in 1995. Pt denies any pain associated. Pt was admitted to the hospital in November 2018 due to MS exacerbation. Nov-Dec to HHPT-->[facility] until end of Jan. After rehab the pt was able to ambulate up to 100 ft with the RW. Pt has Bil AFO, her therapist at [place] thinks she needs a new AFO as her feet still drag when she ambulates, pt reports HHPT mentioned a spring AFO. Pt has custom AFOs that were originally made 4 years ago. Goals: improve balance, to be able to get into the shower with her tub bench without assist, to be able to get up 2 steps at sister's home with railings on both sides but too far apart to reach both sides, to be able to drive adapted car. Pt is staying at other sister's house with one step to get inside and  currently picking up L leg with UEs and then stepping in. Pt steps down leading with LLE. Pt wants to work on walking side to side as this is challenging and she had not yet progressed to this at rehab. Pt able to get in/out the car with assist only to bring BLEs into the car. Hoping to drive by June or July with adapted car (already has one). Pt needs assist bringing LEs into and out of hospital bed. Pt able to perform sit>stand without assist from [facility] with a pillow on the seat for a boost. Pt denies any falls in the past 6 months. Pt has been increasing her time alone at home up to a few hours at a time to become more independent. Pt has both manual and power WC. Uses power WC at home, uses manual WC out in community. Pt able to self  propel manual WC. Pt needs assist putting pants around ankles but she can pull them up. Pt can put on her shoes but needs help getting her leg up to do so. Pt able to tie her shoelaces on her own. Pt has a tub/shower unit at home with a tub bench. No grab bars in the shower. Has hand held shower head. Pt currently taking a sponge bath. Pt has a regular height toilet with a BSC over top. Pt with h/o L wrist fx with no precautions, pt wore her soft brace to evaluation for comfort but reports her wrist has healed and is doing well following OT.     Currently in Pain?  No/denies            in // bars    Standing weight shift with foot clearing BLE: 2 minutes    In // bars: large step forward 8x each LE. BUE support; on seated rest break between LE's.   Ambulate in // bars with focus on hip/knee flexion for foot clearance x2 length of // bars.    Band across // bars mid thigh height: tactile/visual cue for knee/hip flexion for foot clearance, knee band 6x each LE.   Standing hip extension 8x each LE; able to lift LLE off ground/clearing ground.     Sit to stand technique with UE support, decreased reliance upon UE's with improved eccentric control, no "plopping". Multiple performances throughout session    Seated:    Cross body jabs with increasing difficulty and range, increasing challenge to L, R, duck for core stability and postural reactions. -Jab L , R duck for cross body coordination and postural control/righting reactions 10x -Jab R, L duck  for cross body coordination and postural control/righting reactions 10x -Cross body elbow for abdominal strengthening and postural challenge x 60 seconds       Seated rest breaks and use of ice pack utilized to keep body temperature at functional range to reduce MS exacerbation.      Pt educated throughout session about proper posture and technique with exercises. Improved exercise technique, movement at target joints, use of target muscles after  min to mod verbal, visual, tactile cues.                       PT Education - 03/03/19 1237    Education provided  Yes    Education Details  exercise technique, body mechanics    Person(s) Educated  Patient    Methods  Explanation;Demonstration;Tactile cues;Verbal cues    Comprehension  Verbalized understanding;Returned demonstration;Verbal cues required;Tactile cues required  PT Short Term Goals - 01/15/19 1241      PT SHORT TERM GOAL #1   Title  Patient will be compliant with HEP 4-5 days/week for improved carryover between sessions.     Baseline  HEP compliancy between sessions 6/17 compliant    Time  2    Period  Weeks    Status  Achieved    Target Date  10/15/18      PT SHORT TERM GOAL #2   Title  Patient will report no falls/LOB to indicate improved stability with mobility     Baseline  6/17: no falls, one episode of instability 7/20: no falls    Time  2    Period  Weeks    Status  Achieved    Target Date  11/06/18        PT Long Term Goals - 02/14/19 0001      PT LONG TERM GOAL #1   Title  Patient will perform TUG in 40 seconds or less with walker to improve safe negotiation of environment.     Baseline  10/9: deferred due to pain 5/26: 61 seconds 6/17: 60 seconds 7/20: 59 seconds 8/26:  55 seconds    Time  8    Period  Weeks    Status  Partially Met    Target Date  03/12/19      PT LONG TERM GOAL #2   Title  Pt's 73mT will improve to at least 0.5 m/s for improved safety ambulating in home    Baseline  0.14 m/s; 5/15: .230m  7/10: .36 m/s  8/20: .42 m/s  9/26: 0.2658m10/14: .26 m/s improved hip and knee clearance 11/15: 0.31m97mmproved hip and knee clearance; 12/5: .29 m/s improved step length and forward moment: decreased foot drag. 05/09/2018: 0.21 m/s 2/6: .237 m/s 5/26: .23 m/s 6/17: .24 m/s  7/20: 0.26 m/s 8/26: 0.26 m/s 10/8: 0.28 m/s    Time  8    Period  Weeks    Status  Partially Met    Target Date  03/12/19      PT LONG  TERM GOAL #3   Title  Pt's Bil hip F strength will improve to at least 2+/5 BLEs for improved gait mechanics and safety    Baseline  10/8: deferred due to pain L: 2/5, R: 1/5 7/10: 2/5 bilaterally  8/20: 2/5 bilat 9/26: 2/5 bilat 10/14: 2/5 bilaterally ,improved hip flexor muscle activation 2/5: improved hip flexor and quad activation: 12/5: 2/5 ; 05/09/2018: 2/5 2/6: 3/5 knee, 2 to 2+/5 hip 5/26: 2-/5 hips 3/5 knees 6/17: 2-/5 hips 3/5 knees    Time  8    Period  Weeks    Status  Partially Met    Target Date  03/12/19      PT LONG TERM GOAL #4   Title  Pt's ABC Scale will improve to at least 40% to demonstrate improved balance    Baseline  10/9: 32.1% 28.13% 5/15; 24.4% 7/10: 23.4% 8/20: 22. 2% 9/26: 25.3% 10/14: 26.5% 11/15: 24.38% 12/5: 26%; 05/09/2018: 29% 2/6: 23.75% 526: 30% 6/17: 26.8%  7/2: 28% 8/26: 30.3%    Time  8    Period  Weeks    Status  Partially Met    Target Date  03/12/19      PT LONG TERM GOAL #5   Title  Patient will negotiate one step ~4 inches to enter house independently to increase functional independence in natural environment  Baseline  10/9: has lifted 1.5 inch in previous sessions /20: unable to do 9/26: patient too fatigued/not feeling well to perform 10/14: unable to perform at this time 11/15: unable to perform at this time; 12/5: able to lift 1 inch; 05/09/2018: unable to clear 4" step, able to lift foot ~ 1in 5/26: has been unable to perform 6/17: able to lift foot 1.25 inch 8/26: has been able to step up onto 1.5 inch step in previous sessions    Time  8    Period  Weeks    Status  Partially Met    Target Date  03/12/19      PT LONG TERM GOAL #6   Title  Patient will negotiate 30 ft of changing surfaces outside such as pavement and parking lot to allow patient to walk into church.    Baseline  10/9: walks 20 ft outside at store now 12/5: does not walk outside 2/24: unable to test due to rain 5/26: unable since COVID 6/17: raining 7/20 too hot to test 8/26:  unable to walk outside due to heat and COVID    Time  8    Period  Weeks    Status  Partially Met    Target Date  03/12/19      PT LONG TERM GOAL #7   Title  Patient will stand 5 minutes to increase standing duration to allow patient to return to washing dishes and clothes.     Baseline  10/9: deferred due to pain 12/5: 56 seconds; 05/09/2018: 1 min 18 sec 2/6: 2 minutes last session 5/26: 1 minute 36 seconds limited by breathing 6/17: terminated due to abdomen pain  7/20 terminated due to overheating 8/26: able to stand 3 minutes at home    Time  8    Period  Weeks    Status  Partially Met    Target Date  03/12/19            Plan - 03/03/19 1242    Clinical Impression Statement  Patient presents to physical therapy with excellent motivation this session despite fatigue from recent infusion. Patient demonstrated improved foot clearance with ambulation in // bars with verbal and tactile cueing. The patient will continue to benefit from skilled therapeutic intervention to address deficits in strength, mobility, balance, and function for improved overall QOL and safety    Rehab Potential  Good    PT Frequency  2x / week    PT Duration  8 weeks    PT Treatment/Interventions  ADLs/Self Care Home Management;Aquatic Therapy;Biofeedback;Cryotherapy;Electrical Stimulation;Iontophoresis 17m/ml Dexamethasone;Traction;Moist Heat;Ultrasound;DME Instruction;Gait training;Stair training;Functional mobility training;Therapeutic activities;Therapeutic exercise;Balance training;Neuromuscular re-education;Patient/family education;Orthotic Fit/Training;Wheelchair mobility training;Manual techniques;Compression bandaging;Passive range of motion;Dry needling;Energy conservation;Taping;Splinting    PT Next Visit Plan  strength/balance    PT Home Exercise Plan  to initiate next session (please incorporate hip flexor and adductor stretches in HEP if possible)    Consulted and Agree with Plan of Care  Patient        Patient will benefit from skilled therapeutic intervention in order to improve the following deficits and impairments:  Abnormal gait, Decreased activity tolerance, Decreased balance, Decreased coordination, Decreased endurance, Decreased knowledge of use of DME, Decreased mobility, Decreased range of motion, Decreased safety awareness, Decreased strength, Difficulty walking, Impaired perceived functional ability, Impaired sensation, Impaired tone, Impaired UE functional use, Improper body mechanics, Postural dysfunction  Visit Diagnosis: Muscle weakness (generalized)  Other abnormalities of gait and mobility  Unsteadiness on feet     Problem  List Patient Active Problem List   Diagnosis Date Noted  . Abscess of female pelvis   . SVT (supraventricular tachycardia) ([place])   . Radial styloid tenosynovitis 03/12/2018  . Wheelchair confinement 02/27/2018  . Localized osteoporosis with current pathological fracture with routine healing 01/19/2017  . Wrist fracture 01/16/2017  . Sprain of ankle 03/23/2016  . Closed fracture of lateral malleolus 03/16/2016  . Health care maintenance 01/24/2016  . Blood pressure elevated without history of HTN 10/25/2015  . Essential hypertension 10/25/2015  . Multiple sclerosis ([place]) 10/02/2015  . Chronic left shoulder pain 07/19/2015  . Multiple sclerosis exacerbation ([place]) 07/14/2015  . MS (multiple sclerosis) ([place]) 11/26/2014  . Increased body mass index 11/26/2014  . HPV test positive 11/26/2014  . Status post laparoscopic supracervical hysterectomy 11/26/2014  . Galactorrhea 11/26/2014  . Back ache 05/21/2014  . Adiposity 05/21/2014  . Disordered sleep 05/21/2014  . Muscle spasticity 05/21/2014  . Spasticity 05/21/2014  . Calculus of kidney 12/09/2013  . Renal colic 52/12/221  . Hypercholesteremia 08/19/2013  . Hereditary and idiopathic neuropathy 08/19/2013  . Hypercholesterolemia without hypertriglyceridemia 08/19/2013  . Bladder  infection, chronic 07/25/2012  . Disorder of bladder function 07/25/2012  . Incomplete bladder emptying 07/25/2012  . Microscopic hematuria 07/25/2012  . Right upper quadrant pain 07/25/2012   Janna Arch, PT, DPT   03/03/2019, 12:50 PM  [place] [facility] SERVICES [address] [place], [place], [address] Phone: [phone]   Fax:  [phone]  Name: Lisa Williams MRN: [id number] Date of Birth: 1960-05-27

## 2019-03-05 ENCOUNTER — Ambulatory Visit: Payer: Medicare HMO

## 2019-03-05 ENCOUNTER — Other Ambulatory Visit: Payer: Self-pay

## 2019-03-05 DIAGNOSIS — R2689 Other abnormalities of gait and mobility: Secondary | ICD-10-CM

## 2019-03-05 DIAGNOSIS — R2681 Unsteadiness on feet: Secondary | ICD-10-CM

## 2019-03-05 DIAGNOSIS — M6281 Muscle weakness (generalized): Secondary | ICD-10-CM

## 2019-03-05 NOTE — Therapy (Signed)
[place] MAIN [facility] SERVICES [place], [place], [address] Phone: [phone]   Fax:  [phone]  Physical Therapy Treatment  Patient Details  Name: Lisa Williams MRN: [id number] Date of Birth: 09-26-1960 Referring Provider (PT): Gurney Maxin, MD   Encounter Date: 03/05/2019  PT End of Session - 03/05/19 1231    Visit Number  125    Number of Visits  129    Date for PT Re-Evaluation  03/12/19    Authorization Type  5/10 PN 10/8    PT Start Time  1026    PT Stop Time  1110    PT Time Calculation (min)  44 min    Equipment Utilized During Treatment  Gait belt    Activity Tolerance  Patient tolerated treatment well;Patient limited by fatigue   heat intolerance   Behavior During Therapy  [facility] for tasks assessed/performed       Past Medical History:  Diagnosis Date  . Abdominal pain, right upper quadrant   . Back pain   . Calculus of kidney 12/09/2013  . Chronic back pain    unspecified  . Chronic left shoulder pain 07/19/2015  . Functional disorder of bladder    other  . Galactorrhea 11/26/2014   Chronic   . Hereditary and idiopathic neuropathy 08/19/2013  . HPV test positive   . Hypercholesteremia 08/19/2013  . Incomplete bladder emptying   . Microscopic hematuria   . MS (multiple sclerosis) ([place])   . Muscle spasticity 05/21/2014  . Nonspecific findings on examination of urine    other  . Osteopenia   . Status post laparoscopic supracervical hysterectomy 11/26/2014  . Tobacco user 11/26/2014  . Wrist fracture     Past Surgical History:  Procedure Laterality Date  . bilateral tubal ligation  1996  . BREAST CYST EXCISION Left 2002  . KNEE SURGERY     right  . LAPAROSCOPIC SUPRACERVICAL HYSTERECTOMY  08/05/2013  . ORIF WRIST FRACTURE Left 01/17/2017   Procedure: OPEN REDUCTION INTERNAL FIXATION (ORIF) WRIST FRACTURE;  Surgeon: Lovell Sheehan, MD;  Location: [facility] ORS;  Service: Orthopedics;  Laterality:  Left;  . TUBAL LIGATION Bilateral   . VAGINAL HYSTERECTOMY  03/2006    There were no vitals filed for this visit.  Subjective Assessment - 03/05/19 1216    Subjective  Patient reports compliance with HEP, no falls or LOB since last session. Is feeling a little tired this session.    Pertinent History  Pt is a 58 y/o F who presents with BLE weakness and difficulty ambulating due to h/o MS that was diagnosed in 1995. Pt denies any pain associated. Pt was admitted to the hospital in November 2018 due to MS exacerbation. Nov-Dec to HHPT-->[facility] until end of Jan. After rehab the pt was able to ambulate up to 100 ft with the RW. Pt has Bil AFO, her therapist at [facility] thinks she needs a new AFO as her feet still drag when she ambulates, pt reports HHPT mentioned a spring AFO. Pt has custom AFOs that were originally made 4 years ago. Goals: improve balance, to be able to get into the shower with her tub bench without assist, to be able to get up 2 steps at sister's home with railings on both sides but too far apart to reach both sides, to be able to drive adapted car. Pt is staying at other sister's house with one step to get inside and currently picking up L leg with  UEs and then stepping in. Pt steps down leading with LLE. Pt wants to work on walking side to side as this is challenging and she had not yet progressed to this at rehab. Pt able to get in/out the car with assist only to bring BLEs into the car. Hoping to drive by June or July with adapted car (already has one). Pt needs assist bringing LEs into and out of hospital bed. Pt able to perform sit>stand without assist from [facility] with a pillow on the seat for a boost. Pt denies any falls in the past 6 months. Pt has been increasing her time alone at home up to a few hours at a time to become more independent. Pt has both manual and power WC. Uses power WC at home, uses manual WC out in community. Pt able to self propel manual WC. Pt needs assist  putting pants around ankles but she can pull them up. Pt can put on her shoes but needs help getting her leg up to do so. Pt able to tie her shoelaces on her own. Pt has a tub/shower unit at home with a tub bench. No grab bars in the shower. Has hand held shower head. Pt currently taking a sponge bath. Pt has a regular height toilet with a BSC over top. Pt with h/o L wrist fx with no precautions, pt wore her soft brace to evaluation for comfort but reports her wrist has healed and is doing well following OT.     Currently in Pain?  No/denies         Standing with RW: -static stand 2x 30 seconds for upright posture and neutral midline correction -hip flexion towards GTB on RW : slight ability to clear floor with LLE, 8x each LE  Ambulate 56 ft with RW and w/c follow with CGA. Able to perform with no episodes of foot scuffing. Fatigued towards end of duration with decreased step length and increased trunk flexion.    5lb weighted bar seated:  -row 15x with cueing for upright posture -single arm row, 10x each arm -scap retraction with upright pull 10x   Weighted ball (3000 Gr)  -modified seated posterior lean with forward crunch for full trunk/abdomen activation x10 -arc from one knee to another x10  Isometric hip flexion 10x 3 second holds RTB hip abduction each LE 10x  seated hamstring stretch with LE on PT leg for optimal lengthening 60 seconds       Seated rest breaks and use of ice pack utilized to keep body temperature at functional range to reduce MS exacerbation.      Pt educated throughout session about proper posture and technique with exercises. Improved exercise technique, movement at target joints, use of target muscles after min to mod verbal, visual, tactile cues.            PT Education - 03/05/19 1230    Education provided  Yes    Education Details  exercise technique, body  mechanics    Person(s) Educated  Patient    Methods   Explanation;Demonstration;Tactile cues;Verbal cues    Comprehension  Verbalized understanding;Returned demonstration;Verbal cues required;Tactile cues required       PT Short Term Goals - 01/15/19 1241      PT SHORT TERM GOAL #1   Title  Patient will be compliant with HEP 4-5 days/week for improved carryover between sessions.     Baseline  HEP compliancy between sessions 6/17 compliant    Time  2  Period  Weeks    Status  Achieved    Target Date  10/15/18      PT SHORT TERM GOAL #2   Title  Patient will report no falls/LOB to indicate improved stability with mobility     Baseline  6/17: no falls, one episode of instability 7/20: no falls    Time  2    Period  Weeks    Status  Achieved    Target Date  11/06/18        PT Long Term Goals - 02/14/19 0001      PT LONG TERM GOAL #1   Title  Patient will perform TUG in 40 seconds or less with walker to improve safe negotiation of environment.     Baseline  10/9: deferred due to pain 5/26: 61 seconds 6/17: 60 seconds 7/20: 59 seconds 8/26:  55 seconds    Time  8    Period  Weeks    Status  Partially Met    Target Date  03/12/19      PT LONG TERM GOAL #2   Title  Pt's 53mT will improve to at least 0.5 m/s for improved safety ambulating in home    Baseline  0.14 m/s; 5/15: .268m  7/10: .36 m/s  8/20: .42 m/s  9/26: 0.266m10/14: .26 m/s improved hip and knee clearance 11/15: 0.53m61mmproved hip and knee clearance; 12/5: .29 m/s improved step length and forward moment: decreased foot drag. 05/09/2018: 0.21 m/s 2/6: .237 m/s 5/26: .23 m/s 6/17: .24 m/s  7/20: 0.26 m/s 8/26: 0.26 m/s 10/8: 0.28 m/s    Time  8    Period  Weeks    Status  Partially Met    Target Date  03/12/19      PT LONG TERM GOAL #3   Title  Pt's Bil hip F strength will improve to at least 2+/5 BLEs for improved gait mechanics and safety    Baseline  10/8: deferred due to pain L: 2/5, R: 1/5 7/10: 2/5 bilaterally  8/20: 2/5 bilat 9/26: 2/5 bilat 10/14: 2/5  bilaterally ,improved hip flexor muscle activation 2/5: improved hip flexor and quad activation: 12/5: 2/5 ; 05/09/2018: 2/5 2/6: 3/5 knee, 2 to 2+/5 hip 5/26: 2-/5 hips 3/5 knees 6/17: 2-/5 hips 3/5 knees    Time  8    Period  Weeks    Status  Partially Met    Target Date  03/12/19      PT LONG TERM GOAL #4   Title  Pt's ABC Scale will improve to at least 40% to demonstrate improved balance    Baseline  10/9: 32.1% 28.13% 5/15; 24.4% 7/10: 23.4% 8/20: 22. 2% 9/26: 25.3% 10/14: 26.5% 11/15: 24.38% 12/5: 26%; 05/09/2018: 29% 2/6: 23.75% 526: 30% 6/17: 26.8%  7/2: 28% 8/26: 30.3%    Time  8    Period  Weeks    Status  Partially Met    Target Date  03/12/19      PT LONG TERM GOAL #5   Title  Patient will negotiate one step ~4 inches to enter house independently to increase functional independence in natural environment    Baseline  10/9: has lifted 1.5 inch in previous sessions /20: unable to do 9/26: patient too fatigued/not feeling well to perform 10/14: unable to perform at this time 11/15: unable to perform at this time; 12/5: able to lift 1 inch; 05/09/2018: unable to clear 4" step, able to lift foot ~ 1in 5/26:  has been unable to perform 6/17: able to lift foot 1.25 inch 8/26: has been able to step up onto 1.5 inch step in previous sessions    Time  8    Period  Weeks    Status  Partially Met    Target Date  03/12/19      PT LONG TERM GOAL #6   Title  Patient will negotiate 30 ft of changing surfaces outside such as pavement and parking lot to allow patient to walk into church.    Baseline  10/9: walks 20 ft outside at store now 12/5: does not walk outside 2/24: unable to test due to rain 5/26: unable since COVID 6/17: raining 7/20 too hot to test 8/26: unable to walk outside due to heat and COVID    Time  8    Period  Weeks    Status  Partially Met    Target Date  03/12/19      PT LONG TERM GOAL #7   Title  Patient will stand 5 minutes to increase standing duration to allow patient to  return to washing dishes and clothes.     Baseline  10/9: deferred due to pain 12/5: 56 seconds; 05/09/2018: 1 min 18 sec 2/6: 2 minutes last session 5/26: 1 minute 36 seconds limited by breathing 6/17: terminated due to abdomen pain  7/20 terminated due to overheating 8/26: able to stand 3 minutes at home    Time  8    Period  Weeks    Status  Partially Met    Target Date  03/12/19            Plan - 03/05/19 1232    Clinical Impression Statement  Patient presents with good motivation throughout session despite fatigue. Patient is demonstrating better foot clearance bilaterally with decreased episodes of foot shuffle with ambulation. Patient fatigues in standing quickly this session resulting in focus on seated tasks this session. The patient will continue to benefit from skilled therapeutic intervention to address deficits in strength, mobility, balance, and function for improved overall QOL and safety    Rehab Potential  Good    PT Frequency  2x / week    PT Duration  8 weeks    PT Treatment/Interventions  ADLs/Self Care Home Management;Aquatic Therapy;Biofeedback;Cryotherapy;Electrical Stimulation;Iontophoresis 53m/ml Dexamethasone;Traction;Moist Heat;Ultrasound;DME Instruction;Gait training;Stair training;Functional mobility training;Therapeutic activities;Therapeutic exercise;Balance training;Neuromuscular re-education;Patient/family education;Orthotic Fit/Training;Wheelchair mobility training;Manual techniques;Compression bandaging;Passive range of motion;Dry needling;Energy conservation;Taping;Splinting    PT Next Visit Plan  strength/balance    PT Home Exercise Plan  to initiate next session (please incorporate hip flexor and adductor stretches in HEP if possible)    Consulted and Agree with Plan of Care  Patient       Patient will benefit from skilled therapeutic intervention in order to improve the following deficits and impairments:  Abnormal gait, Decreased activity tolerance,  Decreased balance, Decreased coordination, Decreased endurance, Decreased knowledge of use of DME, Decreased mobility, Decreased range of motion, Decreased safety awareness, Decreased strength, Difficulty walking, Impaired perceived functional ability, Impaired sensation, Impaired tone, Impaired UE functional use, Improper body mechanics, Postural dysfunction  Visit Diagnosis: Muscle weakness (generalized)  Other abnormalities of gait and mobility  Unsteadiness on feet     Problem List Patient Active Problem List   Diagnosis Date Noted  . Abscess of female pelvis   . SV[place]cardia) [place]   . Radial styloid tenosynovitis 03/12/2018  . Wheelchair confinement 02/27/2018  . Localized osteoporosis with current pathological fracture with routine healing  01/19/2017  . Wrist fracture 01/16/2017  . Sprain of ankle 03/23/2016  . Closed fracture of lateral malleolus 03/16/2016  . Health care maintenance 01/24/2016  . Blood pressure elevated without history of HTN 10/25/2015  . Essential hypertension 10/25/2015  . Multiple sclerosis ([place]) 10/02/2015  . Chronic left shoulder pain 07/19/2015  . Multiple sclerosis exacerbation ([place]) 07/14/2015  . MS (multiple sclerosis) ([place]) 11/26/2014  . Increased body mass index 11/26/2014  . HPV test positive 11/26/2014  . Status post laparoscopic supracervical hysterectomy 11/26/2014  . Galactorrhea 11/26/2014  . Back ache 05/21/2014  . Adiposity 05/21/2014  . Disordered sleep 05/21/2014  . Muscle spasticity 05/21/2014  . Spasticity 05/21/2014  . Calculus of kidney 12/09/2013  . Renal colic 32/06/3341  . Hypercholesteremia 08/19/2013  . Hereditary and idiopathic neuropathy 08/19/2013  . Hypercholesterolemia without hypertriglyceridemia 08/19/2013  . Bladder infection, chronic 07/25/2012  . Disorder of bladder function 07/25/2012  . Incomplete bladder emptying 07/25/2012  . Microscopic hematuria 07/25/2012  . Right upper  quadrant pain 07/25/2012   Janna Arch, PT, DPT   03/05/2019, 12:34 PM  [place] MAIN [facility] SERVICES [address] [place], [place], [address] Phone: [phone]   Fax:  [phone]  Name: Lisa Williams MRN: [id number] Date of Birth: 1961/03/05

## 2019-03-10 ENCOUNTER — Other Ambulatory Visit: Payer: Self-pay | Admitting: Internal Medicine

## 2019-03-10 DIAGNOSIS — Z1231 Encounter for screening mammogram for malignant neoplasm of breast: Secondary | ICD-10-CM

## 2019-03-11 ENCOUNTER — Other Ambulatory Visit: Payer: Self-pay

## 2019-03-11 ENCOUNTER — Ambulatory Visit: Payer: Medicare HMO | Attending: Neurology

## 2019-03-11 DIAGNOSIS — R2689 Other abnormalities of gait and mobility: Secondary | ICD-10-CM | POA: Diagnosis present

## 2019-03-11 DIAGNOSIS — M6281 Muscle weakness (generalized): Secondary | ICD-10-CM | POA: Diagnosis present

## 2019-03-11 DIAGNOSIS — R2681 Unsteadiness on feet: Secondary | ICD-10-CM | POA: Diagnosis present

## 2019-03-11 NOTE — Therapy (Signed)
[place] MAIN [facility] SERVICES [place], [place], [address] Phone: [phone]   Fax:  [phone]  Physical Therapy Treatment  Patient Details  Name: Lisa Williams MRN: [id number] Date of Birth: [birth year] Referring Provider (PT): Gurney Maxin, MD   Encounter Date: 03/11/2019  PT End of Session - 03/11/19 1233    Visit Number  [id number]    Number of Visits  129    Date for PT Re-Evaluation  03/12/19    Authorization Type  6/10 PN 10/8    PT Start Time  1030    PT Stop Time  1112    PT Time Calculation (min)  42 min    Equipment Utilized During Treatment  Gait belt    Activity Tolerance  Patient tolerated treatment well   heat intolerance   Behavior During Therapy  [facility] for tasks assessed/performed       Past Medical History:  Diagnosis Date  . Abdominal pain, right upper quadrant   . Back pain   . Calculus of kidney 12/09/2013  . Chronic back pain    unspecified  . Chronic left shoulder pain 07/19/2015  . Functional disorder of bladder    other  . Galactorrhea 11/26/2014   Chronic   . Hereditary and idiopathic neuropathy 08/19/2013  . HPV test positive   . Hypercholesteremia 08/19/2013  . Incomplete bladder emptying   . Microscopic hematuria   . MS (multiple sclerosis) ([place])   . Muscle spasticity 05/21/2014  . Nonspecific findings on examination of urine    other  . Osteopenia   . Status post laparoscopic supracervical hysterectomy 11/26/2014  . Tobacco user 11/26/2014  . Wrist fracture     Past Surgical History:  Procedure Laterality Date  . bilateral tubal ligation  1996  . BREAST CYST EXCISION Left 2002  . KNEE SURGERY     right  . LAPAROSCOPIC SUPRACERVICAL HYSTERECTOMY  08/05/2013  . ORIF WRIST FRACTURE Left 01/17/2017   Procedure: OPEN REDUCTION INTERNAL FIXATION (ORIF) WRIST FRACTURE;  Surgeon: Lovell Sheehan, MD;  Location: [facility] ORS;  Service: Orthopedics;  Laterality: Left;  . TUBAL LIGATION  Bilateral   . VAGINAL HYSTERECTOMY  03/2006    There were no vitals filed for this visit.  Subjective Assessment - 03/11/19 1225    Subjective  Patient reported compliance with HEP, no falls, pain, or complaints this session, stated she felt pretty good.    Pertinent History  Pt is a 58 y/o F who presents with BLE weakness and difficulty ambulating due to h/o MS that was diagnosed in 1995. Pt denies any pain associated. Pt was admitted to the hospital in November 2018 due to MS exacerbation. Nov-Dec to HHPT-->[facility] until end of Jan. After rehab the pt was able to ambulate up to 100 ft with the RW. Pt has Bil AFO, her therapist at [place] thinks she needs a new AFO as her feet still drag when she ambulates, pt reports HHPT mentioned a spring AFO. Pt has custom AFOs that were originally made 4 years ago. Goals: improve balance, to be able to get into the shower with her tub bench without assist, to be able to get up 2 steps at sister's home with railings on both sides but too far apart to reach both sides, to be able to drive adapted car. Pt is staying at other sister's house with one step to get inside and currently picking up L leg with UEs and then stepping in.  Pt steps down leading with LLE. Pt wants to work on walking side to side as this is challenging and she had not yet progressed to this at rehab. Pt able to get in/out the car with assist only to bring BLEs into the car. Hoping to drive by June or July with adapted car (already has one). Pt needs assist bringing LEs into and out of hospital bed. Pt able to perform sit>stand without assist from [facility] with a pillow on the seat for a boost. Pt denies any falls in the past 6 months. Pt has been increasing her time alone at home up to a few hours at a time to become more independent. Pt has both manual and power WC. Uses power WC at home, uses manual WC out in community. Pt able to self propel manual WC. Pt needs assist putting pants around ankles  but she can pull them up. Pt can put on her shoes but needs help getting her leg up to do so. Pt able to tie her shoelaces on her own. Pt has a tub/shower unit at home with a tub bench. No grab bars in the shower. Has hand held shower head. Pt currently taking a sponge bath. Pt has a regular height toilet with a BSC over top. Pt with h/o L wrist fx with no precautions, pt wore her soft brace to evaluation for comfort but reports her wrist has healed and is doing well following OT.     Limitations  Lifting;Standing;Walking;House hold activities    How long can you sit comfortably?  n/a    How long can you stand comfortably?  3 minutes without UE assist, limited by fatigue    How long can you walk comfortably?  100 ft, limited by fatigue    Currently in Pain?  No/denies        in // bars  Standing weight shift with foot clearing BLE: 2 minutes  In // bars: large stepforward8x each LE. BUE support; on seated rest break between LE's.  Ambulate in // bars with focus on hip/knee flexion for foot clearance x2 length of // bars.   Band across // bars mid thigh height: tactile/visual cue for knee/hip flexion for foot clearance, knee band10x each LE.  Standing hip extension8x each LE; able to clear both feet ea rep   Sit to stand technique with UE support, decreased reliance upon UE's with improved eccentric control, no "plopping". Multiple performances throughout session  Seated: Cross body jabs with increasing difficulty and range, increasing challenge to L, R, duck for core stability and postural reactions. -Jab L , R duck for cross body coordination and postural control/righting reactions 10x -Jab R, L duck for cross body coordination and postural control/righting reactions 10x -Cross body elbowfor abdominal strengthening and postural challenge x 60 seconds  Seated rest breaks and use of ice pack utilized to keep body temperature at functional range to reduce MS  exacerbation.  Pt educated throughout session about proper posture and technique with exercises. Improved exercise technique, movement at target joints, use of target muscles after min to mod verbal, visual, tactile cues.  Pt response/clinical impression: The patient demonstrated excellent motivation throughout session, pt fatigued towards end of session and after standing exercises. Good foot clearance noted with all standing exercises this session. The patient would benefit from further skilled PT intervention to maximize mobility ,safety, and independence.     PT Education - 03/11/19 1230    Education provided  Yes  Education Details  exercise technique, body mechanics    Person(s) Educated  Patient    Methods  Explanation;Demonstration;Verbal cues    Comprehension  Verbalized understanding;Returned demonstration;Verbal cues required;Tactile cues required       PT Short Term Goals - 01/15/19 1241      PT SHORT TERM GOAL #1   Title  Patient will be compliant with HEP 4-5 days/week for improved carryover between sessions.     Baseline  HEP compliancy between sessions 6/17 compliant    Time  2    Period  Weeks    Status  Achieved    Target Date  10/15/18      PT SHORT TERM GOAL #2   Title  Patient will report no falls/LOB to indicate improved stability with mobility     Baseline  6/17: no falls, one episode of instability 7/20: no falls    Time  2    Period  Weeks    Status  Achieved    Target Date  11/06/18        PT Long Term Goals - 02/14/19 0001      PT LONG TERM GOAL #1   Title  Patient will perform TUG in 40 seconds or less with walker to improve safe negotiation of environment.     Baseline  10/9: deferred due to pain 5/26: 61 seconds 6/17: 60 seconds 7/20: 59 seconds 8/26:  55 seconds    Time  8    Period  Weeks    Status  Partially Met    Target Date  03/12/19      PT LONG TERM GOAL #2   Title  Pt's 90mT will improve to at least 0.5 m/s for improved  safety ambulating in home    Baseline  0.14 m/s; 5/15: .285m  7/10: .36 m/s  8/20: .42 m/s  9/26: 0.2614m10/14: .26 m/s improved hip and knee clearance 11/15: 0.52m57mmproved hip and knee clearance; 12/5: .29 m/s improved step length and forward moment: decreased foot drag. 05/09/2018: 0.21 m/s 2/6: .237 m/s 5/26: .23 m/s 6/17: .24 m/s  7/20: 0.26 m/s 8/26: 0.26 m/s 10/8: 0.28 m/s    Time  8    Period  Weeks    Status  Partially Met    Target Date  03/12/19      PT LONG TERM GOAL #3   Title  Pt's Bil hip F strength will improve to at least 2+/5 BLEs for improved gait mechanics and safety    Baseline  10/8: deferred due to pain L: 2/5, R: 1/5 7/10: 2/5 bilaterally  8/20: 2/5 bilat 9/26: 2/5 bilat 10/14: 2/5 bilaterally ,improved hip flexor muscle activation 2/5: improved hip flexor and quad activation: 12/5: 2/5 ; 05/09/2018: 2/5 2/6: 3/5 knee, 2 to 2+/5 hip 5/26: 2-/5 hips 3/5 knees 6/17: 2-/5 hips 3/5 knees    Time  8    Period  Weeks    Status  Partially Met    Target Date  03/12/19      PT LONG TERM GOAL #4   Title  Pt's ABC Scale will improve to at least 40% to demonstrate improved balance    Baseline  10/9: 32.1% 28.13% 5/15; 24.4% 7/10: 23.4% 8/20: 22. 2% 9/26: 25.3% 10/14: 26.5% 11/15: 24.38% 12/5: 26%; 05/09/2018: 29% 2/6: 23.75% 526: 30% 6/17: 26.8%  7/2: 28% 8/26: 30.3%    Time  8    Period  Weeks    Status  Partially Met  Target Date  03/12/19      PT LONG TERM GOAL #5   Title  Patient will negotiate one step ~4 inches to enter house independently to increase functional independence in natural environment    Baseline  10/9: has lifted 1.5 inch in previous sessions /20: unable to do 9/26: patient too fatigued/not feeling well to perform 10/14: unable to perform at this time 11/15: unable to perform at this time; 12/5: able to lift 1 inch; 05/09/2018: unable to clear 4" step, able to lift foot ~ 1in 5/26: has been unable to perform 6/17: able to lift foot 1.25 inch 8/26: has been able  to step up onto 1.5 inch step in previous sessions    Time  8    Period  Weeks    Status  Partially Met    Target Date  03/12/19      PT LONG TERM GOAL #6   Title  Patient will negotiate 30 ft of changing surfaces outside such as pavement and parking lot to allow patient to walk into church.    Baseline  10/9: walks 20 ft outside at store now 12/5: does not walk outside 2/24: unable to test due to rain 5/26: unable since COVID 6/17: raining 7/20 too hot to test 8/26: unable to walk outside due to heat and COVID    Time  8    Period  Weeks    Status  Partially Met    Target Date  03/12/19      PT LONG TERM GOAL #7   Title  Patient will stand 5 minutes to increase standing duration to allow patient to return to washing dishes and clothes.     Baseline  10/9: deferred due to pain 12/5: 56 seconds; 05/09/2018: 1 min 18 sec 2/6: 2 minutes last session 5/26: 1 minute 36 seconds limited by breathing 6/17: terminated due to abdomen pain  7/20 terminated due to overheating 8/26: able to stand 3 minutes at home    Time  8    Period  Weeks    Status  Partially Met    Target Date  03/12/19            Plan - 03/11/19 1231    Clinical Impression Statement  The patient demonstrated excellent motivation throughout session, pt fatigued towards end of session and after standing exercises. Good foot clearance noted with all standing exercises this session. The patient would benefit from further skilled PT intervention to maximize mobility ,safety, and independence.    Rehab Potential  Good    PT Frequency  2x / week    PT Duration  8 weeks    PT Treatment/Interventions  ADLs/Self Care Home Management;Aquatic Therapy;Biofeedback;Cryotherapy;Electrical Stimulation;Iontophoresis 12m/ml Dexamethasone;Traction;Moist Heat;Ultrasound;DME Instruction;Gait training;Stair training;Functional mobility training;Therapeutic activities;Therapeutic exercise;Balance training;Neuromuscular re-education;Patient/family  education;Orthotic Fit/Training;Wheelchair mobility training;Manual techniques;Compression bandaging;Passive range of motion;Dry needling;Energy conservation;Taping;Splinting    PT Next Visit Plan  strength/balance    PT Home Exercise Plan  to initiate next session (please incorporate hip flexor and adductor stretches in HEP if possible)    Consulted and Agree with Plan of Care  Patient       Patient will benefit from skilled therapeutic intervention in order to improve the following deficits and impairments:  Abnormal gait, Decreased activity tolerance, Decreased balance, Decreased coordination, Decreased endurance, Decreased knowledge of use of DME, Decreased mobility, Decreased range of motion, Decreased safety awareness, Decreased strength, Difficulty walking, Impaired perceived functional ability, Impaired sensation, Impaired tone, Impaired UE functional use, Improper  body mechanics, Postural dysfunction  Visit Diagnosis: Muscle weakness (generalized)  Other abnormalities of gait and mobility  Unsteadiness on feet     Problem List Patient Active Problem List   Diagnosis Date Noted  . Abscess of female pelvis   . SVT (supraventricular tachycardia) ([place])   . Radial styloid tenosynovitis 03/12/2018  . Wheelchair confinement 02/27/2018  . Localized osteoporosis with current pathological fracture with routine healing 01/19/2017  . Wrist fracture 01/16/2017  . Sprain of ankle 03/23/2016  . Closed fracture of lateral malleolus 03/16/2016  . Health care maintenance 01/24/2016  . Blood pressure elevated without history of HTN 10/25/2015  . Essential hypertension 10/25/2015  . Multiple sclerosis ([place]) 10/02/2015  . Chronic left shoulder pain 07/19/2015  . Multiple sclerosis exacerbation ([place]) 07/14/2015  . MS (multiple sclerosis) ([place]) 11/26/2014  . Increased body mass index 11/26/2014  . HPV test positive 11/26/2014  . Status post laparoscopic supracervical hysterectomy 11/26/2014   . Galactorrhea 11/26/2014  . Back ache 05/21/2014  . Adiposity 05/21/2014  . Disordered sleep 05/21/2014  . Muscle spasticity 05/21/2014  . Spasticity 05/21/2014  . Calculus of kidney 12/09/2013  . Renal colic 19/54/2481  . Hypercholesteremia 08/19/2013  . Hereditary and idiopathic neuropathy 08/19/2013  . Hypercholesterolemia without hypertriglyceridemia 08/19/2013  . Bladder infection, chronic 07/25/2012  . Disorder of bladder function 07/25/2012  . Incomplete bladder emptying 07/25/2012  . Microscopic hematuria 07/25/2012  . Right upper quadrant pain 07/25/2012    Lieutenant Diego PT, DPT 12:34 PM,03/11/19 [place] MAIN [facility] SERVICES [address] [place], [place], [address] Phone: [phone]   Fax:  [phone]  Name: Lisa Williams MRN: [id number] Date of Birth: [birth year]

## 2019-03-12 ENCOUNTER — Ambulatory Visit: Payer: Medicare HMO | Admitting: Urology

## 2019-03-12 ENCOUNTER — Encounter: Payer: Self-pay | Admitting: Urology

## 2019-03-12 ENCOUNTER — Other Ambulatory Visit: Payer: Self-pay

## 2019-03-12 VITALS — BP 144/76 | HR 74 | Ht 71.0 in | Wt 274.0 lb

## 2019-03-12 DIAGNOSIS — N3281 Overactive bladder: Secondary | ICD-10-CM

## 2019-03-12 LAB — BLADDER SCAN AMB NON-IMAGING

## 2019-03-12 MED ORDER — OXYBUTYNIN CHLORIDE ER 10 MG PO TB24: 10.0000 mg | ORAL_TABLET | Freq: Every day | ORAL | 11 refills | Status: DC

## 2019-03-12 NOTE — Progress Notes (Signed)
03/12/2019 9:19 AM   Lisa Williams 1960/10/20 [id number]  Referring provider: Lauro Regulus, MD [address] [facility] [place],  [place] [address]  Chief Complaint  Patient presents with  . Follow-up    HPI: 58 year old female with MS and urinary urgency frequency and manage and oxybutynin who returns today for routine annual follow-up.  Overall, she is doing extremely well this year.  Her symptoms are well controlled on oxybutynin.  For financial reasons, she ended up taking oxybutynin 5 mg immediate release dosing twice daily which was effective.  She would prefer to transition back to oxybutynin 10 mg XL daily when her insurance changes in January.  She feels like her bladder is well controlled on this medication with no significant urgency or frequency.  No UTIs this year.  No flank pain or kidney stone episodes.  No new diagnoses or significant medical changes this year.   PMH: Past Medical History:  Diagnosis Date  . Abdominal pain, right upper quadrant   . Back pain   . Calculus of kidney 12/09/2013  . Chronic back pain    unspecified  . Chronic left shoulder pain 07/19/2015  . Functional disorder of bladder    other  . Galactorrhea 11/26/2014   Chronic   . Hereditary and idiopathic neuropathy 08/19/2013  . HPV test positive   . Hypercholesteremia 08/19/2013  . Incomplete bladder emptying   . Microscopic hematuria   . MS (multiple sclerosis) ([facility])   . Muscle spasticity 05/21/2014  . Nonspecific findings on examination of urine    other  . Osteopenia   . Status post laparoscopic supracervical hysterectomy 11/26/2014  . Tobacco user 11/26/2014  . Wrist fracture     Surgical History: Past Surgical History:  Procedure Laterality Date  . bilateral tubal ligation  1996  . BREAST CYST EXCISION Left 2002  . KNEE SURGERY     right  . LAPAROSCOPIC SUPRACERVICAL HYSTERECTOMY  08/05/2013  . ORIF WRIST FRACTURE Left 01/17/2017   Procedure: OPEN REDUCTION INTERNAL FIXATION (ORIF) WRIST FRACTURE;  Surgeon: Lyndle Herrlich, MD;  Location: [facility] ORS;  Service: Orthopedics;  Laterality: Left;  . TUBAL LIGATION Bilateral   . VAGINAL HYSTERECTOMY  03/2006    Home Medications:  Allergies as of 03/12/2019      Reactions   Amlodipine Other (See Comments)   edema   Povidone Iodine Rash      Medication List       Accurate as of March 12, 2019 11:59 PM. If you have any questions, ask your nurse or doctor.        STOP taking these medications   oxybutynin 5 MG tablet Commonly known as: DITROPAN Replaced by: oxybutynin 10 MG 24 hr tablet Stopped by: Vanna Scotland, MD     TAKE these medications   baclofen 20 MG tablet Commonly known as: LIORESAL Take 20 mg by mouth 3 (three) times daily.   EQL Vitamin D3 25 MCG (1000 UT) tablet Generic drug: Cholecalciferol Take 2,000 Units by mouth daily.   gabapentin 100 MG capsule Commonly known as: NEURONTIN   MULTIPLE VITAMIN PO Take 1 tablet by mouth daily.   oxybutynin 10 MG 24 hr tablet Commonly known as: DITROPAN-XL Take 1 tablet (10 mg total) by mouth daily. Replaces: oxybutynin 5 MG tablet Started by: Vanna Scotland, MD   telmisartan 40 MG tablet Commonly known as: MICARDIS Take 40 mg by mouth daily.       Allergies:  Allergies  Allergen Reactions  . Amlodipine Other (See Comments)    edema  . Povidone Iodine Rash    Family History: Family History  Problem Relation Age of Onset  . Sickle cell trait Sister   . Hypertension Sister   . Supraventricular tachycardia Sister   . Hypertension Sister   . Diabetes Maternal Grandmother   . Liver cancer Maternal Grandmother   . Diabetes Maternal Aunt   . Breast cancer Maternal Aunt        great MAT  . Ovarian cancer Paternal Grandmother   . Nephrolithiasis Father   . Hypertension Father   . Prostate cancer Father   . Kidney disease Father   . Heart failure Father   . Hypertension Sister   .  Osteoarthritis Mother   . Hypertension Mother   . Hyperlipidemia Mother   . GU problems Neg Hx   . Urolithiasis Neg Hx     Social History:  reports that she has quit smoking. Her smoking use included cigarettes. She smoked 0.50 packs per day. She has never used smokeless tobacco. She reports that she does not drink alcohol or use drugs.  ROS: 12 point review of systems performed and is otherwise negative than as per HPI.  She does have some chronic issues including gait instability, weakness which are unchanged and chronic.  Physical Exam: BP (!) 144/76   Pulse 74   Ht 5\' 11"  (1.803 m)   Wt 274 lb (124.3 kg)   BMI 38.22 kg/m   Constitutional:  Alert and oriented, No acute distress.  In wheelchair today. HEENT: [place] AT, moist mucus membranes.  Trachea midline, no masses. Cardiovascular: No clubbing, cyanosis, or edema. Respiratory: Normal respiratory effort, no increased work of breathing. Skin: No rashes, bruises or suspicious lesions. Neurologic: Grossly intact, no focal deficits, moving all 4 extremities. Psychiatric: Normal mood and affect.  Laboratory Data: Lab Results  Component Value Date   WBC 6.9 02/28/2019   HGB 13.1 02/28/2019   HCT 37.7 02/28/2019   MCV 74.8 (L) 02/28/2019   PLT 235 02/28/2019    Lab Results  Component Value Date   CREATININE 0.67 02/28/2019    Lab Results  Component Value Date   HGBA1C 5.1 11/29/2016    Pertinent Imaging: Results for orders placed or performed in visit on 03/12/19  Bladder Scan (Post Void Residual) in office  Result Value Ref Range   Scan Result 44ml      Assessment & Plan:    1. OAB (overactive bladder) Symptoms well controlled on oxybutynin 5 mg twice daily Transition to oxybutynin 10 mg XL Medical bladder emptying today No recent UTIs or any alarm symptoms Given her overall stability, I believe is reasonable at this point to follow-up with her primary care and have them refill this medication.  She should  return if her symptoms start to worsen or she has any side effects or questions.  She understands all this and will return as needed. - Bladder Scan (Post Void Residual) in office   Return if symptoms worsen or fail to improve.  Hollice Espy, MD  [facility] [address] [place], [place] [address] [phone]

## 2019-03-13 ENCOUNTER — Ambulatory Visit: Payer: Medicare HMO

## 2019-03-13 DIAGNOSIS — M6281 Muscle weakness (generalized): Secondary | ICD-10-CM

## 2019-03-13 DIAGNOSIS — R2681 Unsteadiness on feet: Secondary | ICD-10-CM

## 2019-03-13 DIAGNOSIS — R2689 Other abnormalities of gait and mobility: Secondary | ICD-10-CM

## 2019-03-13 NOTE — Therapy (Signed)
[place] MAIN [facility] SERVICES [address] [place], [place], [address] Phone: [phone]   Fax:  [phone]  Physical Therapy Treatment/ RECERT  Patient Details  Name: Lisa Williams MRN: [id number] Date of Birth: 09-Jun-1960 Referring Provider (PT): Gurney Maxin, MD   Encounter Date: 03/13/2019  PT End of Session - 03/13/19 1125    Visit Number  [id number]    Number of Visits  143    Date for PT Re-Evaluation  05/08/19    Authorization Type  7/10 PN 10/8    PT Start Time  1030    PT Stop Time  1104    PT Time Calculation (min)  34 min    Equipment Utilized During Treatment  Gait belt    Activity Tolerance  Patient tolerated treatment well   heat intolerance   Behavior During Therapy  [facility] for tasks assessed/performed       Past Medical History:  Diagnosis Date  . Abdominal pain, right upper quadrant   . Back pain   . Calculus of kidney 12/09/2013  . Chronic back pain    unspecified  . Chronic left shoulder pain 07/19/2015  . Functional disorder of bladder    other  . Galactorrhea 11/26/2014   Chronic   . Hereditary and idiopathic neuropathy 08/19/2013  . HPV test positive   . Hypercholesteremia 08/19/2013  . Incomplete bladder emptying   . Microscopic hematuria   . MS (multiple sclerosis) ([place])   . Muscle spasticity 05/21/2014  . Nonspecific findings on examination of urine    other  . Osteopenia   . Status post laparoscopic supracervical hysterectomy 11/26/2014  . Tobacco user 11/26/2014  . Wrist fracture     Past Surgical History:  Procedure Laterality Date  . bilateral tubal ligation  1996  . BREAST CYST EXCISION Left 2002  . KNEE SURGERY     right  . LAPAROSCOPIC SUPRACERVICAL HYSTERECTOMY  08/05/2013  . ORIF WRIST FRACTURE Left 01/17/2017   Procedure: OPEN REDUCTION INTERNAL FIXATION (ORIF) WRIST FRACTURE;  Surgeon: Lovell Sheehan, MD;  Location: [facility] ORS;  Service: Orthopedics;  Laterality: Left;  . TUBAL  LIGATION Bilateral   . VAGINAL HYSTERECTOMY  03/2006    There were no vitals filed for this visit.  Subjective Assessment - 03/13/19 1100    Subjective  Patient reports compliance with HEP, she has had no falls or LOB since last session.    Pertinent History  Pt is a 58 y/o F who presents with BLE weakness and difficulty ambulating due to h/o MS that was diagnosed in 1995. Pt denies any pain associated. Pt was admitted to the hospital in November 2018 due to MS exacerbation. Nov-Dec to HHPT-->[facility] until end of Jan. After rehab the pt was able to ambulate up to 100 ft with the RW. Pt has Bil AFO, her therapist at [place] thinks she needs a new AFO as her feet still drag when she ambulates, pt reports HHPT mentioned a spring AFO. Pt has custom AFOs that were originally made 4 years ago. Goals: improve balance, to be able to get into the shower with her tub bench without assist, to be able to get up 2 steps at sister's home with railings on both sides but too far apart to reach both sides, to be able to drive adapted car. Pt is staying at other sister's house with one step to get inside and currently picking up L leg with UEs and then stepping in. Pt  steps down leading with LLE. Pt wants to work on walking side to side as this is challenging and she had not yet progressed to this at rehab. Pt able to get in/out the car with assist only to bring BLEs into the car. Hoping to drive by June or July with adapted car (already has one). Pt needs assist bringing LEs into and out of hospital bed. Pt able to perform sit>stand without assist from [facility] with a pillow on the seat for a boost. Pt denies any falls in the past 6 months. Pt has been increasing her time alone at home up to a few hours at a time to become more independent. Pt has both manual and power WC. Uses power WC at home, uses manual WC out in community. Pt able to self propel manual WC. Pt needs assist putting pants around ankles but she can pull  them up. Pt can put on her shoes but needs help getting her leg up to do so. Pt able to tie her shoelaces on her own. Pt has a tub/shower unit at home with a tub bench. No grab bars in the shower. Has hand held shower head. Pt currently taking a sponge bath. Pt has a regular height toilet with a BSC over top. Pt with h/o L wrist fx with no precautions, pt wore her soft brace to evaluation for comfort but reports her wrist has healed and is doing well following OT.     Limitations  Lifting;Standing;Walking;House hold activities    How long can you sit comfortably?  n/a    How long can you stand comfortably?  3 minutes without UE assist, limited by fatigue    How long can you walk comfortably?  100 ft, limited by fatigue    Currently in Pain?  No/denies       Goals:  TUG: 45 seconds with RW  10 MWT: 0.30 m/s   ABC : 32.2%  4" step: see goals  30 ft outside: see goals 5 minute stand: able to stand duration of a bath (sponge bath in room) >9 minutes at home  Static stand prior to fatigue after ambulation: 1 min 28 seconds x 2   new goal: take bath in bathroom.   seated cross body windmills 12x each side   Sit to stand technique with UE support, decreased reliance upon UE's with improved eccentric control, no "plopping". Multiple performances throughout session   Seated rest breaks and use of ice pack utilized to keep body temperature at functional range to reduce MS exacerbation.   Pt educated throughout session about proper posture and technique with exercises. Improved exercise technique, movement at target joints, use of target muscles after min to mod verbal, visual, tactile cues.              PT Education - 03/13/19 1124    Education provided  Yes    Education Details  goals, POC    Person(s) Educated  Patient    Methods  Explanation;Demonstration;Tactile cues;Verbal cues    Comprehension  Verbalized understanding;Other (comment);Returned demonstration;Verbal cues  required;Tactile cues required       PT Short Term Goals - 03/13/19 1733      PT SHORT TERM GOAL #1   Title  Patient will be compliant with HEP 4-5 days/week for improved carryover between sessions.     Baseline  HEP compliancy between sessions 6/17 compliant    Time  2    Period  Weeks    Status  Achieved    Target Date  10/15/18      PT SHORT TERM GOAL #2   Title  Patient will report no falls/LOB to indicate improved stability with mobility     Baseline  6/17: no falls, one episode of instability 7/20: no falls    Time  2    Period  Weeks    Status  Achieved    Target Date  11/06/18        PT Long Term Goals - 03/13/19 0001      PT LONG TERM GOAL #1   Title  Patient will perform TUG in 40 seconds or less with walker to improve safe negotiation of environment.     Baseline  11/5: 45 seconds 10/9: deferred due to pain 5/26: 61 seconds 6/17: 60 seconds 7/20: 59 seconds 8/26:  55 seconds    Time  8    Period  Weeks    Status  Partially Met    Target Date  05/08/19      PT LONG TERM GOAL #2   Title  Pt's 47mT will improve to at least 0.5 m/s for improved safety ambulating in home    Baseline  03/13/19: 0.30 m/s 0.14 m/s; 5/15: .250m  7/10: .36 m/s  8/20: .42 m/s  9/26: 0.2668m10/14: .26 m/s improved hip and knee clearance 11/15: 0.79m97mmproved hip and knee clearance; 12/5: .29 m/s improved step length and forward moment: decreased foot drag. 05/09/2018: 0.21 m/s 2/6: .237 m/s 5/26: .23 m/s 6/17: .24 m/s  7/20: 0.26 m/s 8/26: 0.26 m/s 10/8: 0.28 m/s    Time  8    Period  Weeks    Status  Partially Met    Target Date  05/08/19      PT LONG TERM GOAL #3   Title  Pt's Bil hip F strength will improve to at least 2+/5 BLEs for improved gait mechanics and safety    Baseline  11/5: Hips: 2/5 knees 3+/5 10/8: deferred due to pain L: 2/5, R: 1/5 7/10: 2/5 bilaterally  8/20: 2/5 bilat 9/26: 2/5 bilat 10/14: 2/5 bilaterally ,improved hip flexor muscle activation 2/5: improved hip  flexor and quad activation: 12/5: 2/5 ; 05/09/2018: 2/5 2/6: 3/5 knee, 2 to 2+/5 hip 5/26: 2-/5 hips 3/5 knees 6/17: 2-/5 hips 3/5 knees    Time  8    Period  Weeks    Status  Partially Met    Target Date  05/08/19      PT LONG TERM GOAL #4   Title  Pt's ABC Scale will improve to at least 40% to demonstrate improved balance    Baseline  11/5: 32.2% 10/9: 32.1% 28.13% 5/15; 24.4% 7/10: 23.4% 8/20: 22. 2% 9/26: 25.3% 10/14: 26.5% 11/15: 24.38% 12/5: 26%; 05/09/2018: 29% 2/6: 23.75% 526: 30% 6/17: 26.8%  7/2: 28% 8/26: 30.3%    Time  8    Period  Weeks    Status  Partially Met    Target Date  05/08/19      PT LONG TERM GOAL #5   Title  Patient will negotiate one step ~4 inches to enter house independently to increase functional independence in natural environment    Baseline  11/5: able to lift 1.75 inch 10/9: has lifted 1.5 inch in previous sessions /20: unable to do 9/26: patient too fatigued/not feeling well to perform 10/14: unable to perform at this time 11/15: unable to perform at this time; 12/5: able to lift 1 inch; 05/09/2018: unable  to clear 4" step, able to lift foot ~ 1in 5/26: has been unable to perform 6/17: able to lift foot 1.25 inch 8/26: has been able to step up onto 1.5 inch step in previous sessions    Time  8    Period  Weeks    Status  Partially Met    Target Date  05/08/19      PT LONG TERM GOAL #6   Title  Patient will negotiate 30 ft of changing surfaces outside such as pavement and parking lot to allow patient to walk into church.    Baseline  11/5; walks outside to cart at store ~20 ft 10/9: walks 20 ft outside at store now 12/5: does not walk outside 2/24: unable to test due to rain 5/26: unable since COVID 6/17: raining 7/20 too hot to test 8/26: unable to walk outside due to heat and COVID    Time  8    Period  Weeks    Status  Partially Met    Target Date  05/08/19      PT LONG TERM GOAL #7   Title  Patient will stand 5 minutes to increase standing duration to  allow patient to return to washing dishes and clothes.     Baseline  11/5: able to perform duration of sponge bath in room ~9 minutes 10/9: deferred due to pain 12/5: 56 seconds; 05/09/2018: 1 min 18 sec 2/6: 2 minutes last session 5/26: 1 minute 36 seconds limited by breathing 6/17: terminated due to abdomen pain  7/20 terminated due to overheating 8/26: able to stand 3 minutes at home    Time  8    Period  Weeks    Status  Achieved      PT LONG TERM GOAL #8   Title  Patient will negotiate tub with mod I to be able to bath/shower in bathroom requiring stability and ability to negotiate small area.     Baseline  11/5: has to sponge bath standing up in room     Time  8    Period  Weeks    Status  New    Target Date  05/08/19            Plan - 03/13/19 1433    Clinical Impression Statement  Patient is demonstrating excellent progress towards goals at this time. She is improving her TUG speed by 10 seconds to 45 seconds with RW, Her 10 MWT improved to 0.30 m/s with RW and CGA. She is now able to stand for her bath (sponge bath) in her room without needing to sit > 9 minutes without sitting now. Patient is activating posterior chain muscles with improved quality and amplitude, lifting LLE for better clearance now. The patient will continue to benefit from skilled therapeutic intervention to address deficits in strength, mobility, balance, and function for improved overall QOL and safety    Rehab Potential  Good    PT Frequency  2x / week    PT Duration  8 weeks    PT Treatment/Interventions  ADLs/Self Care Home Management;Aquatic Therapy;Biofeedback;Cryotherapy;Electrical Stimulation;Iontophoresis 43m/ml Dexamethasone;Traction;Moist Heat;Ultrasound;DME Instruction;Gait training;Stair training;Functional mobility training;Therapeutic activities;Therapeutic exercise;Balance training;Neuromuscular re-education;Patient/family education;Orthotic Fit/Training;Wheelchair mobility training;Manual  techniques;Compression bandaging;Passive range of motion;Dry needling;Energy conservation;Taping;Splinting    PT Next Visit Plan  strength/balance    PT Home Exercise Plan  to initiate next session (please incorporate hip flexor and adductor stretches in HEP if possible)    Consulted and Agree with Plan of Care  Patient  Patient will benefit from skilled therapeutic intervention in order to improve the following deficits and impairments:  Abnormal gait, Decreased activity tolerance, Decreased balance, Decreased coordination, Decreased endurance, Decreased knowledge of use of DME, Decreased mobility, Decreased range of motion, Decreased safety awareness, Decreased strength, Difficulty walking, Impaired perceived functional ability, Impaired sensation, Impaired tone, Impaired UE functional use, Improper body mechanics, Postural dysfunction  Visit Diagnosis: Muscle weakness (generalized)  Other abnormalities of gait and mobility  Unsteadiness on feet     Problem List Patient Active Problem List   Diagnosis Date Noted  . Abscess of female pelvis   . SVT (supraventricular tachycardia) ([place])   . Radial styloid tenosynovitis 03/12/2018  . Wheelchair confinement 02/27/2018  . Localized osteoporosis with current pathological fracture with routine healing 01/19/2017  . Wrist fracture 01/16/2017  . Sprain of ankle 03/23/2016  . Closed fracture of lateral malleolus 03/16/2016  . Health care maintenance 01/24/2016  . Blood pressure elevated without history of HTN 10/25/2015  . Essential hypertension 10/25/2015  . Multiple sclerosis ([place]) 10/02/2015  . Chronic left shoulder pain 07/19/2015  . Multiple sclerosis exacerbation ([place]) 07/14/2015  . MS (multiple sclerosis) ([place]) 11/26/2014  . Increased body mass index 11/26/2014  . HPV test positive 11/26/2014  . Status post laparoscopic supracervical hysterectomy 11/26/2014  . Galactorrhea 11/26/2014  . Back ache 05/21/2014  . Adiposity  05/21/2014  . Disordered sleep 05/21/2014  . Muscle spasticity 05/21/2014  . Spasticity 05/21/2014  . Calculus of kidney 12/09/2013  . Renal colic 38/18/2993  . Hypercholesteremia 08/19/2013  . Hereditary and idiopathic neuropathy 08/19/2013  . Hypercholesterolemia without hypertriglyceridemia 08/19/2013  . Bladder infection, chronic 07/25/2012  . Disorder of bladder function 07/25/2012  . Incomplete bladder emptying 07/25/2012  . Microscopic hematuria 07/25/2012  . Right upper quadrant pain 07/25/2012   Janna Arch, PT, DPT   03/13/2019, 5:43 PM  [place] MAIN [facility] SERVICES [address] [place], [place], [address] Phone: [phone]   Fax:  [phone]  Name: Lisa Williams MRN: [id number] Date of Birth: Jul 13, 1960

## 2019-03-18 ENCOUNTER — Ambulatory Visit: Payer: Medicare HMO

## 2019-03-18 DIAGNOSIS — R2689 Other abnormalities of gait and mobility: Secondary | ICD-10-CM

## 2019-03-18 DIAGNOSIS — M6281 Muscle weakness (generalized): Secondary | ICD-10-CM | POA: Diagnosis not present

## 2019-03-18 DIAGNOSIS — R2681 Unsteadiness on feet: Secondary | ICD-10-CM

## 2019-03-18 NOTE — Therapy (Signed)
[place] MAIN [facility] SERVICES [address] [place], [place], [address] Phone: [phone]   Fax:  [phone]  Physical Therapy Treatment  Patient Details  Name: Lisa Williams MRN: [id number] Date of Birth: 15-Jan-1961 Referring Provider (PT): Gurney Maxin, MD   Encounter Date: 03/18/2019  PT End of Session - 03/18/19 1222    Visit Number  [id number]    Number of Visits  143    Date for PT Re-Evaluation  05/08/19    Authorization Type  8/10 PN 10/8    PT Start Time  1015    PT Stop Time  1058    PT Time Calculation (min)  43 min    Equipment Utilized During Treatment  Gait belt    Activity Tolerance  Patient tolerated treatment well   heat intolerance   Behavior During Therapy  [facility] for tasks assessed/performed       Past Medical History:  Diagnosis Date  . Abdominal pain, right upper quadrant   . Back pain   . Calculus of kidney 12/09/2013  . Chronic back pain    unspecified  . Chronic left shoulder pain 07/19/2015  . Functional disorder of bladder    other  . Galactorrhea 11/26/2014   Chronic   . Hereditary and idiopathic neuropathy 08/19/2013  . HPV test positive   . Hypercholesteremia 08/19/2013  . Incomplete bladder emptying   . Microscopic hematuria   . MS (multiple sclerosis) ([place])   . Muscle spasticity 05/21/2014  . Nonspecific findings on examination of urine    other  . Osteopenia   . Status post laparoscopic supracervical hysterectomy 11/26/2014  . Tobacco user 11/26/2014  . Wrist fracture     Past Surgical History:  Procedure Laterality Date  . bilateral tubal ligation  1996  . BREAST CYST EXCISION Left 2002  . KNEE SURGERY     right  . LAPAROSCOPIC SUPRACERVICAL HYSTERECTOMY  08/05/2013  . ORIF WRIST FRACTURE Left 01/17/2017   Procedure: OPEN REDUCTION INTERNAL FIXATION (ORIF) WRIST FRACTURE;  Surgeon: Lovell Sheehan, MD;  Location: [facility] ORS;  Service: Orthopedics;  Laterality: Left;  . TUBAL LIGATION  Bilateral   . VAGINAL HYSTERECTOMY  03/2006    There were no vitals filed for this visit.  Subjective Assessment - 03/18/19 1221    Subjective  Patient presents with good motivation. No falls or LOB since last session.    Pertinent History  Pt is a 58 y/o F who presents with BLE weakness and difficulty ambulating due to h/o MS that was diagnosed in 1995. Pt denies any pain associated. Pt was admitted to the hospital in November 2018 due to MS exacerbation. Nov-Dec to HHPT-->[facility] until end of Jan. After rehab the pt was able to ambulate up to 100 ft with the RW. Pt has Bil AFO, her therapist at [place] thinks she needs a new AFO as her feet still drag when she ambulates, pt reports HHPT mentioned a spring AFO. Pt has custom AFOs that were originally made 4 years ago. Goals: improve balance, to be able to get into the shower with her tub bench without assist, to be able to get up 2 steps at sister's home with railings on both sides but too far apart to reach both sides, to be able to drive adapted car. Pt is staying at other sister's house with one step to get inside and currently picking up L leg with UEs and then stepping in. Pt steps down leading with  LLE. Pt wants to work on walking side to side as this is challenging and she had not yet progressed to this at rehab. Pt able to get in/out the car with assist only to bring BLEs into the car. Hoping to drive by June or July with adapted car (already has one). Pt needs assist bringing LEs into and out of hospital bed. Pt able to perform sit>stand without assist from [facility] with a pillow on the seat for a boost. Pt denies any falls in the past 6 months. Pt has been increasing her time alone at home up to a few hours at a time to become more independent. Pt has both manual and power WC. Uses power WC at home, uses manual WC out in community. Pt able to self propel manual WC. Pt needs assist putting pants around ankles but she can pull them up. Pt can put  on her shoes but needs help getting her leg up to do so. Pt able to tie her shoelaces on her own. Pt has a tub/shower unit at home with a tub bench. No grab bars in the shower. Has hand held shower head. Pt currently taking a sponge bath. Pt has a regular height toilet with a BSC over top. Pt with h/o L wrist fx with no precautions, pt wore her soft brace to evaluation for comfort but reports her wrist has healed and is doing well following OT.     Limitations  Lifting;Standing;Walking;House hold activities    How long can you sit comfortably?  n/a    How long can you stand comfortably?  3 minutes without UE assist, limited by fatigue    How long can you walk comfortably?  100 ft, limited by fatigue    Currently in Pain?  No/denies            in // bars   Standing weight shift with foot clearing BLE: 2 minutes   Use of speed ladder for visual cueing/object negotiation, step into box, out of box 6-8x each LE, improved clearance with challenge for posterior muscle control for return of foot to position.   Seated:  Isometrics: -hip flexion into PT hands 12x 3 second holds each LE, single LE at a time -hip abduction into PT hands 12x 3 second holds with improved contraction with repetition -hip adduction into PT hands 12x 3 second holds with improved muscle contraction with repetition  Swiss ball: -TrA activation press between knees and hands 10x 3 second holds -straight arm raises 10x -rotational twists: 10x each LE for core activation and postural muscle challenge  LAQ 10x each LE with RLE more challenged than LLE at this time.   Hamstring stretch 60 seconds each LE with PT overpressure      Seated rest breaks and use of ice pack utilized to keep body temperature at functional range to reduce MS exacerbation.      Pt educated throughout session about proper posture and technique with exercises. Improved exercise technique, movement at target joints, use of target muscles after min  to mod verbal, visual, tactile cues.                 PT Education - 03/18/19 1221    Education provided  Yes    Education Details  exercise technique, foot clearance    Person(s) Educated  Patient    Methods  Explanation;Demonstration;Tactile cues;Verbal cues    Comprehension  Verbalized understanding;Returned demonstration;Verbal cues required;Tactile cues required  PT Short Term Goals - 03/13/19 1733      PT SHORT TERM GOAL #1   Title  Patient will be compliant with HEP 4-5 days/week for improved carryover between sessions.     Baseline  HEP compliancy between sessions 6/17 compliant    Time  2    Period  Weeks    Status  Achieved    Target Date  10/15/18      PT SHORT TERM GOAL #2   Title  Patient will report no falls/LOB to indicate improved stability with mobility     Baseline  6/17: no falls, one episode of instability 7/20: no falls    Time  2    Period  Weeks    Status  Achieved    Target Date  11/06/18        PT Long Term Goals - 03/13/19 0001      PT LONG TERM GOAL #1   Title  Patient will perform TUG in 40 seconds or less with walker to improve safe negotiation of environment.     Baseline  11/5: 45 seconds 10/9: deferred due to pain 5/26: 61 seconds 6/17: 60 seconds 7/20: 59 seconds 8/26:  55 seconds    Time  8    Period  Weeks    Status  Partially Met    Target Date  05/08/19      PT LONG TERM GOAL #2   Title  Pt's 76mT will improve to at least 0.5 m/s for improved safety ambulating in home    Baseline  03/13/19: 0.30 m/s 0.14 m/s; 5/15: .239m  7/10: .36 m/s  8/20: .42 m/s  9/26: 0.2672m10/14: .26 m/s improved hip and knee clearance 11/15: 0.59m109mmproved hip and knee clearance; 12/5: .29 m/s improved step length and forward moment: decreased foot drag. 05/09/2018: 0.21 m/s 2/6: .237 m/s 5/26: .23 m/s 6/17: .24 m/s  7/20: 0.26 m/s 8/26: 0.26 m/s 10/8: 0.28 m/s    Time  8    Period  Weeks    Status  Partially Met    Target Date  05/08/19       PT LONG TERM GOAL #3   Title  Pt's Bil hip F strength will improve to at least 2+/5 BLEs for improved gait mechanics and safety    Baseline  11/5: Hips: 2/5 knees 3+/5 10/8: deferred due to pain L: 2/5, R: 1/5 7/10: 2/5 bilaterally  8/20: 2/5 bilat 9/26: 2/5 bilat 10/14: 2/5 bilaterally ,improved hip flexor muscle activation 2/5: improved hip flexor and quad activation: 12/5: 2/5 ; 05/09/2018: 2/5 2/6: 3/5 knee, 2 to 2+/5 hip 5/26: 2-/5 hips 3/5 knees 6/17: 2-/5 hips 3/5 knees    Time  8    Period  Weeks    Status  Partially Met    Target Date  05/08/19      PT LONG TERM GOAL #4   Title  Pt's ABC Scale will improve to at least 40% to demonstrate improved balance    Baseline  11/5: 32.2% 10/9: 32.1% 28.13% 5/15; 24.4% 7/10: 23.4% 8/20: 22. 2% 9/26: 25.3% 10/14: 26.5% 11/15: 24.38% 12/5: 26%; 05/09/2018: 29% 2/6: 23.75% 526: 30% 6/17: 26.8%  7/2: 28% 8/26: 30.3%    Time  8    Period  Weeks    Status  Partially Met    Target Date  05/08/19      PT LONG TERM GOAL #5   Title  Patient will negotiate one step ~4 inches to  enter house independently to increase functional independence in natural environment    Baseline  11/5: able to lift 1.75 inch 10/9: has lifted 1.5 inch in previous sessions /20: unable to do 9/26: patient too fatigued/not feeling well to perform 10/14: unable to perform at this time 11/15: unable to perform at this time; 12/5: able to lift 1 inch; 05/09/2018: unable to clear 4" step, able to lift foot ~ 1in 5/26: has been unable to perform 6/17: able to lift foot 1.25 inch 8/26: has been able to step up onto 1.5 inch step in previous sessions    Time  8    Period  Weeks    Status  Partially Met    Target Date  05/08/19      PT LONG TERM GOAL #6   Title  Patient will negotiate 30 ft of changing surfaces outside such as pavement and parking lot to allow patient to walk into church.    Baseline  11/5; walks outside to cart at store ~20 ft 10/9: walks 20 ft outside at store now  12/5: does not walk outside 2/24: unable to test due to rain 5/26: unable since COVID 6/17: raining 7/20 too hot to test 8/26: unable to walk outside due to heat and COVID    Time  8    Period  Weeks    Status  Partially Met    Target Date  05/08/19      PT LONG TERM GOAL #7   Title  Patient will stand 5 minutes to increase standing duration to allow patient to return to washing dishes and clothes.     Baseline  11/5: able to perform duration of sponge bath in room ~9 minutes 10/9: deferred due to pain 12/5: 56 seconds; 05/09/2018: 1 min 18 sec 2/6: 2 minutes last session 5/26: 1 minute 36 seconds limited by breathing 6/17: terminated due to abdomen pain  7/20 terminated due to overheating 8/26: able to stand 3 minutes at home    Time  8    Period  Weeks    Status  Achieved      PT LONG TERM GOAL #8   Title  Patient will negotiate tub with mod I to be able to bath/shower in bathroom requiring stability and ability to negotiate small area.     Baseline  11/5: has to sponge bath standing up in room     Time  8    Period  Weeks    Status  New    Target Date  05/08/19            Plan - 03/18/19 1224    Clinical Impression Statement  Patient is progressing with foot clearance in standing with increased ability to negotiate obstacle of speed ladder. She is challenged with eccentric control returning to starting position with fatigue limiting repetitions. Patient continues to demonstrate excellent motivation throughout session. The patient will continue to benefit from skilled therapeutic intervention to address deficits in strength, mobility, balance, and function for improved overall QOL and safety    Rehab Potential  Good    PT Frequency  2x / week    PT Duration  8 weeks    PT Treatment/Interventions  ADLs/Self Care Home Management;Aquatic Therapy;Biofeedback;Cryotherapy;Electrical Stimulation;Iontophoresis 55m/ml Dexamethasone;Traction;Moist Heat;Ultrasound;DME Instruction;Gait  training;Stair training;Functional mobility training;Therapeutic activities;Therapeutic exercise;Balance training;Neuromuscular re-education;Patient/family education;Orthotic Fit/Training;Wheelchair mobility training;Manual techniques;Compression bandaging;Passive range of motion;Dry needling;Energy conservation;Taping;Splinting    PT Next Visit Plan  strength/balance    PT Home Exercise Plan  to  initiate next session (please incorporate hip flexor and adductor stretches in HEP if possible)    Consulted and Agree with Plan of Care  Patient       Patient will benefit from skilled therapeutic intervention in order to improve the following deficits and impairments:  Abnormal gait, Decreased activity tolerance, Decreased balance, Decreased coordination, Decreased endurance, Decreased knowledge of use of DME, Decreased mobility, Decreased range of motion, Decreased safety awareness, Decreased strength, Difficulty walking, Impaired perceived functional ability, Impaired sensation, Impaired tone, Impaired UE functional use, Improper body mechanics, Postural dysfunction  Visit Diagnosis: Muscle weakness (generalized)  Other abnormalities of gait and mobility  Unsteadiness on feet     Problem List Patient Active Problem List   Diagnosis Date Noted  . Abscess of female pelvis   . SVT (supraventricular tachycardia) ([place])   . Radial styloid tenosynovitis 03/12/2018  . Wheelchair confinement 02/27/2018  . Localized osteoporosis with current pathological fracture with routine healing 01/19/2017  . Wrist fracture 01/16/2017  . Sprain of ankle 03/23/2016  . Closed fracture of lateral malleolus 03/16/2016  . Health care maintenance 01/24/2016  . Blood pressure elevated without history of HTN 10/25/2015  . Essential hypertension 10/25/2015  . Multiple sclerosis ([place]) 10/02/2015  . Chronic left shoulder pain 07/19/2015  . Multiple sclerosis exacerbation ([place]) 07/14/2015  . MS (multiple sclerosis)  ([place]) 11/26/2014  . Increased body mass index 11/26/2014  . HPV test positive 11/26/2014  . Status post laparoscopic supracervical hysterectomy 11/26/2014  . Galactorrhea 11/26/2014  . Back ache 05/21/2014  . Adiposity 05/21/2014  . Disordered sleep 05/21/2014  . Muscle spasticity 05/21/2014  . Spasticity 05/21/2014  . Calculus of kidney 12/09/2013  . Renal colic 54/88/4573  . Hypercholesteremia 08/19/2013  . Hereditary and idiopathic neuropathy 08/19/2013  . Hypercholesterolemia without hypertriglyceridemia 08/19/2013  . Bladder infection, chronic 07/25/2012  . Disorder of bladder function 07/25/2012  . Incomplete bladder emptying 07/25/2012  . Microscopic hematuria 07/25/2012  . Right upper quadrant pain 07/25/2012   Janna Arch, PT, DPT   03/18/2019, 12:25 PM  [place] MAIN [facility] SERVICES [address] [place], [place], [address] Phone: [phone]   Fax:  [phone]  Name: Rovena Hearld Gjerde MRN: [id number] Date of Birth: 27-Jan-1961

## 2019-03-20 ENCOUNTER — Other Ambulatory Visit: Payer: Self-pay

## 2019-03-20 ENCOUNTER — Ambulatory Visit: Payer: Medicare HMO

## 2019-03-20 DIAGNOSIS — R2681 Unsteadiness on feet: Secondary | ICD-10-CM

## 2019-03-20 DIAGNOSIS — M6281 Muscle weakness (generalized): Secondary | ICD-10-CM | POA: Diagnosis not present

## 2019-03-20 DIAGNOSIS — R2689 Other abnormalities of gait and mobility: Secondary | ICD-10-CM

## 2019-03-20 NOTE — Therapy (Signed)
[place] MAIN [facility] SERVICES [address] [place], [place], [address] Phone: [phone]   Fax:  [phone]  Physical Therapy Treatment  Patient Details  Name: Lisa Williams MRN: [id number] Date of Birth: 08-Nov-1960 Referring Provider (PT): Gurney Maxin, MD   Encounter Date: 03/20/2019  PT End of Session - 03/20/19 1213    Visit Number  [id number]    Number of Visits  143    Date for PT Re-Evaluation  05/08/19    Authorization Type  9/10 PN 10/8    PT Start Time  1015    PT Stop Time  1057    PT Time Calculation (min)  42 min    Equipment Utilized During Treatment  Gait belt    Activity Tolerance  Patient tolerated treatment well   heat intolerance   Behavior During Therapy  [facility] for tasks assessed/performed       Past Medical History:  Diagnosis Date  . Abdominal pain, right upper quadrant   . Back pain   . Calculus of kidney 12/09/2013  . Chronic back pain    unspecified  . Chronic left shoulder pain 07/19/2015  . Functional disorder of bladder    other  . Galactorrhea 11/26/2014   Chronic   . Hereditary and idiopathic neuropathy 08/19/2013  . HPV test positive   . Hypercholesteremia 08/19/2013  . Incomplete bladder emptying   . Microscopic hematuria   . MS (multiple sclerosis) ([place])   . Muscle spasticity 05/21/2014  . Nonspecific findings on examination of urine    other  . Osteopenia   . Status post laparoscopic supracervical hysterectomy 11/26/2014  . Tobacco user 11/26/2014  . Wrist fracture     Past Surgical History:  Procedure Laterality Date  . bilateral tubal ligation  1996  . BREAST CYST EXCISION Left 2002  . KNEE SURGERY     right  . LAPAROSCOPIC SUPRACERVICAL HYSTERECTOMY  08/05/2013  . ORIF WRIST FRACTURE Left 01/17/2017   Procedure: OPEN REDUCTION INTERNAL FIXATION (ORIF) WRIST FRACTURE;  Surgeon: Lovell Sheehan, MD;  Location: [facility] ORS;  Service: Orthopedics;  Laterality: Left;  . TUBAL LIGATION  Bilateral   . VAGINAL HYSTERECTOMY  03/2006    There were no vitals filed for this visit.  Subjective Assessment - 03/20/19 1113    Subjective  Patient reports wearing her heavier shoes due to weather. Has been compliant with HEP.    Pertinent History  Pt is a 58 y/o F who presents with BLE weakness and difficulty ambulating due to h/o MS that was diagnosed in 1995. Pt denies any pain associated. Pt was admitted to the hospital in November 2018 due to MS exacerbation. Nov-Dec to HHPT-->[facility] until end of Jan. After rehab the pt was able to ambulate up to 100 ft with the RW. Pt has Bil AFO, her therapist at [facility] thinks she needs a new AFO as her feet still drag when she ambulates, pt reports HHPT mentioned a spring AFO. Pt has custom AFOs that were originally made 4 years ago. Goals: improve balance, to be able to get into the shower with her tub bench without assist, to be able to get up 2 steps at sister's home with railings on both sides but too far apart to reach both sides, to be able to drive adapted car. Pt is staying at other sister's house with one step to get inside and currently picking up L leg with UEs and then stepping in. Pt steps down  leading with LLE. Pt wants to work on walking side to side as this is challenging and she had not yet progressed to this at rehab. Pt able to get in/out the car with assist only to bring BLEs into the car. Hoping to drive by June or July with adapted car (already has one). Pt needs assist bringing LEs into and out of hospital bed. Pt able to perform sit>stand without assist from [facility] with a pillow on the seat for a boost. Pt denies any falls in the past 6 months. Pt has been increasing her time alone at home up to a few hours at a time to become more independent. Pt has both manual and power WC. Uses power WC at home, uses manual WC out in community. Pt able to self propel manual WC. Pt needs assist putting pants around ankles but she can pull them  up. Pt can put on her shoes but needs help getting her leg up to do so. Pt able to tie her shoelaces on her own. Pt has a tub/shower unit at home with a tub bench. No grab bars in the shower. Has hand held shower head. Pt currently taking a sponge bath. Pt has a regular height toilet with a BSC over top. Pt with h/o L wrist fx with no precautions, pt wore her soft brace to evaluation for comfort but reports her wrist has healed and is doing well following OT.     Limitations  Lifting;Standing;Walking;House hold activities    How long can you sit comfortably?  n/a    How long can you stand comfortably?  3 minutes without UE assist, limited by fatigue    How long can you walk comfortably?  100 ft, limited by fatigue    Currently in Pain?  No/denies          Standing with RW: -static stand 2x 30 seconds for upright posture and neutral midline correction -hip flexion towards GTB on RW : slight ability to clear floor with LLE, 8x each LE   Ambulate 56 ft with RW and w/c follow with CGA. Able to perform with no episodes of foot scuffing. Fatigued towards end of duration with decreased step length and increased trunk flexion.   3000lb weighted ball seated:  -row 15x with cueing for upright posture Reverse crunch 15x with cueing for control of eccentric/concentric hold.  -upright raise with straight arm 15x -arc motion 10x each side  -scap retraction with upright pull 10x    Weighted ball (3000 Gr)  -modified seated posterior lean with forward crunch for full trunk/abdomen activation x10 -arc from one knee to another x10   Isometric hip flexion 10x 3 second holds RTB hip abduction each LE 10x  seated hamstring stretch with LE on PT leg for optimal lengthening 60 seconds      Seated rest breaks and use of ice pack utilized to keep body temperature at functional range to reduce MS exacerbation.      Pt educated throughout session about proper posture and technique with exercises. Improved  exercise technique, movement at target joints, use of target muscles after min to mod verbal, visual, tactile cues.                        PT Education - 03/20/19 1213    Education provided  Yes    Education Details  exercise technique, body mechanics    Person(s) Educated  Patient    Methods  Explanation;Demonstration;Tactile cues;Verbal cues    Comprehension  Verbalized understanding;Returned demonstration;Tactile cues required;Verbal cues required       PT Short Term Goals - 03/13/19 1733      PT SHORT TERM GOAL #1   Title  Patient will be compliant with HEP 4-5 days/week for improved carryover between sessions.     Baseline  HEP compliancy between sessions 6/17 compliant    Time  2    Period  Weeks    Status  Achieved    Target Date  10/15/18      PT SHORT TERM GOAL #2   Title  Patient will report no falls/LOB to indicate improved stability with mobility     Baseline  6/17: no falls, one episode of instability 7/20: no falls    Time  2    Period  Weeks    Status  Achieved    Target Date  11/06/18        PT Long Term Goals - 03/13/19 0001      PT LONG TERM GOAL #1   Title  Patient will perform TUG in 40 seconds or less with walker to improve safe negotiation of environment.     Baseline  11/5: 45 seconds 10/9: deferred due to pain 5/26: 61 seconds 6/17: 60 seconds 7/20: 59 seconds 8/26:  55 seconds    Time  8    Period  Weeks    Status  Partially Met    Target Date  05/08/19      PT LONG TERM GOAL #2   Title  Pt's 22mT will improve to at least 0.5 m/s for improved safety ambulating in home    Baseline  03/13/19: 0.30 m/s 0.14 m/s; 5/15: .242m  7/10: .36 m/s  8/20: .42 m/s  9/26: 0.2617m10/14: .26 m/s improved hip and knee clearance 11/15: 0.71m43mmproved hip and knee clearance; 12/5: .29 m/s improved step length and forward moment: decreased foot drag. 05/09/2018: 0.21 m/s 2/6: .237 m/s 5/26: .23 m/s 6/17: .24 m/s  7/20: 0.26 m/s 8/26: 0.26 m/s  10/8: 0.28 m/s    Time  8    Period  Weeks    Status  Partially Met    Target Date  05/08/19      PT LONG TERM GOAL #3   Title  Pt's Bil hip F strength will improve to at least 2+/5 BLEs for improved gait mechanics and safety    Baseline  11/5: Hips: 2/5 knees 3+/5 10/8: deferred due to pain L: 2/5, R: 1/5 7/10: 2/5 bilaterally  8/20: 2/5 bilat 9/26: 2/5 bilat 10/14: 2/5 bilaterally ,improved hip flexor muscle activation 2/5: improved hip flexor and quad activation: 12/5: 2/5 ; 05/09/2018: 2/5 2/6: 3/5 knee, 2 to 2+/5 hip 5/26: 2-/5 hips 3/5 knees 6/17: 2-/5 hips 3/5 knees    Time  8    Period  Weeks    Status  Partially Met    Target Date  05/08/19      PT LONG TERM GOAL #4   Title  Pt's ABC Scale will improve to at least 40% to demonstrate improved balance    Baseline  11/5: 32.2% 10/9: 32.1% 28.13% 5/15; 24.4% 7/10: 23.4% 8/20: 22. 2% 9/26: 25.3% 10/14: 26.5% 11/15: 24.38% 12/5: 26%; 05/09/2018: 29% 2/6: 23.75% 526: 30% 6/17: 26.8%  7/2: 28% 8/26: 30.3%    Time  8    Period  Weeks    Status  Partially Met    Target Date  05/08/19  PT LONG TERM GOAL #5   Title  Patient will negotiate one step ~4 inches to enter house independently to increase functional independence in natural environment    Baseline  11/5: able to lift 1.75 inch 10/9: has lifted 1.5 inch in previous sessions /20: unable to do 9/26: patient too fatigued/not feeling well to perform 10/14: unable to perform at this time 11/15: unable to perform at this time; 12/5: able to lift 1 inch; 05/09/2018: unable to clear 4" step, able to lift foot ~ 1in 5/26: has been unable to perform 6/17: able to lift foot 1.25 inch 8/26: has been able to step up onto 1.5 inch step in previous sessions    Time  8    Period  Weeks    Status  Partially Met    Target Date  05/08/19      PT LONG TERM GOAL #6   Title  Patient will negotiate 30 ft of changing surfaces outside such as pavement and parking lot to allow patient to walk into church.     Baseline  11/5; walks outside to cart at store ~20 ft 10/9: walks 20 ft outside at store now 12/5: does not walk outside 2/24: unable to test due to rain 5/26: unable since COVID 6/17: raining 7/20 too hot to test 8/26: unable to walk outside due to heat and COVID    Time  8    Period  Weeks    Status  Partially Met    Target Date  05/08/19      PT LONG TERM GOAL #7   Title  Patient will stand 5 minutes to increase standing duration to allow patient to return to washing dishes and clothes.     Baseline  11/5: able to perform duration of sponge bath in room ~9 minutes 10/9: deferred due to pain 12/5: 56 seconds; 05/09/2018: 1 min 18 sec 2/6: 2 minutes last session 5/26: 1 minute 36 seconds limited by breathing 6/17: terminated due to abdomen pain  7/20 terminated due to overheating 8/26: able to stand 3 minutes at home    Time  8    Period  Weeks    Status  Achieved      PT LONG TERM GOAL #8   Title  Patient will negotiate tub with mod I to be able to bath/shower in bathroom requiring stability and ability to negotiate small area.     Baseline  11/5: has to sponge bath standing up in room     Time  8    Period  Weeks    Status  New    Target Date  05/08/19            Plan - 03/20/19 1219    Clinical Impression Statement  Patient progressing with improved gait mechanics with increased foot clearance despite wearing heavier shoe. She continues to have excellent motivation throughout physical therapy session.  The patient will continue to benefit from skilled therapeutic intervention to address deficits in strength, mobility, balance, and function for improved overall QOL and safety    Rehab Potential  Good    PT Frequency  2x / week    PT Duration  8 weeks    PT Treatment/Interventions  ADLs/Self Care Home Management;Aquatic Therapy;Biofeedback;Cryotherapy;Electrical Stimulation;Iontophoresis 64m/ml Dexamethasone;Traction;Moist Heat;Ultrasound;DME Instruction;Gait training;Stair  training;Functional mobility training;Therapeutic activities;Therapeutic exercise;Balance training;Neuromuscular re-education;Patient/family education;Orthotic Fit/Training;Wheelchair mobility training;Manual techniques;Compression bandaging;Passive range of motion;Dry needling;Energy conservation;Taping;Splinting    PT Next Visit Plan  strength/balance    PT Home Exercise  Plan  to initiate next session (please incorporate hip flexor and adductor stretches in HEP if possible)    Consulted and Agree with Plan of Care  Patient       Patient will benefit from skilled therapeutic intervention in order to improve the following deficits and impairments:  Abnormal gait, Decreased activity tolerance, Decreased balance, Decreased coordination, Decreased endurance, Decreased knowledge of use of DME, Decreased mobility, Decreased range of motion, Decreased safety awareness, Decreased strength, Difficulty walking, Impaired perceived functional ability, Impaired sensation, Impaired tone, Impaired UE functional use, Improper body mechanics, Postural dysfunction  Visit Diagnosis: Muscle weakness (generalized)  Other abnormalities of gait and mobility  Unsteadiness on feet     Problem List Patient Active Problem List   Diagnosis Date Noted  . Abscess of female pelvis   . SVT (supraventricular tachycardia) ([place])   . Radial styloid tenosynovitis 03/12/2018  . Wheelchair confinement 02/27/2018  . Localized osteoporosis with current pathological fracture with routine healing 01/19/2017  . Wrist fracture 01/16/2017  . Sprain of ankle 03/23/2016  . Closed fracture of lateral malleolus 03/16/2016  . Health care maintenance 01/24/2016  . Blood pressure elevated without history of HTN 10/25/2015  . Essential hypertension 10/25/2015  . Multiple sclerosis ([place]) 10/02/2015  . Chronic left shoulder pain 07/19/2015  . Multiple sclerosis exacerbation ([place]) 07/14/2015  . MS (multiple sclerosis) ([place]) 11/26/2014   . Increased body mass index 11/26/2014  . HPV test positive 11/26/2014  . Status post laparoscopic supracervical hysterectomy 11/26/2014  . Galactorrhea 11/26/2014  . Back ache 05/21/2014  . Adiposity 05/21/2014  . Disordered sleep 05/21/2014  . Muscle spasticity 05/21/2014  . Spasticity 05/21/2014  . Calculus of kidney 12/09/2013  . Renal colic 48/59/2763  . Hypercholesteremia 08/19/2013  . Hereditary and idiopathic neuropathy 08/19/2013  . Hypercholesterolemia without hypertriglyceridemia 08/19/2013  . Bladder infection, chronic 07/25/2012  . Disorder of bladder function 07/25/2012  . Incomplete bladder emptying 07/25/2012  . Microscopic hematuria 07/25/2012  . Right upper quadrant pain 07/25/2012   Janna Arch, PT, DPT   03/20/2019, 12:20 PM  [place] MAIN [facility] SERVICES [address] [place], [place], [address] Phone: [phone]   Fax:  [phone]  Name: Lisa Williams MRN: [id number] Date of Birth: 04-10-1961

## 2019-03-25 ENCOUNTER — Ambulatory Visit: Payer: Medicare HMO

## 2019-03-25 ENCOUNTER — Other Ambulatory Visit: Payer: Self-pay

## 2019-03-25 DIAGNOSIS — R2681 Unsteadiness on feet: Secondary | ICD-10-CM

## 2019-03-25 DIAGNOSIS — M6281 Muscle weakness (generalized): Secondary | ICD-10-CM | POA: Diagnosis not present

## 2019-03-25 DIAGNOSIS — R2689 Other abnormalities of gait and mobility: Secondary | ICD-10-CM

## 2019-03-25 NOTE — Therapy (Signed)
[place] MAIN [facility] SERVICES [address] [place], [place], [address] Phone: [phone]   Fax:  [phone]  Physical Therapy Treatment Physical Therapy Progress Note   Dates of reporting period  02/13/19   to   03/25/19  Patient Details  Name: Lisa Williams MRN: [id number] Date of Birth: 01-26-1961 Referring Provider (PT): Gurney Maxin, MD   Encounter Date: 03/25/2019  PT End of Session - 03/25/19 1318    Visit Number  [id number]    Number of Visits  143    Date for PT Re-Evaluation  05/08/19    Authorization Type  10/10 PN 10/8; next note 1/10 PN 03/25/19    PT Start Time  1030    PT Stop Time  1111    PT Time Calculation (min)  41 min    Equipment Utilized During Treatment  Gait belt    Activity Tolerance  Patient tolerated treatment well   heat intolerance   Behavior During Therapy  WFL for tasks assessed/performed       Past Medical History:  Diagnosis Date  . Abdominal pain, right upper quadrant   . Back pain   . Calculus of kidney 12/09/2013  . Chronic back pain    unspecified  . Chronic left shoulder pain 07/19/2015  . Functional disorder of bladder    other  . Galactorrhea 11/26/2014   Chronic   . Hereditary and idiopathic neuropathy 08/19/2013  . HPV test positive   . Hypercholesteremia 08/19/2013  . Incomplete bladder emptying   . Microscopic hematuria   . MS (multiple sclerosis) ([place])   . Muscle spasticity 05/21/2014  . Nonspecific findings on examination of urine    other  . Osteopenia   . Status post laparoscopic supracervical hysterectomy 11/26/2014  . Tobacco user 11/26/2014  . Wrist fracture     Past Surgical History:  Procedure Laterality Date  . bilateral tubal ligation  1996  . BREAST CYST EXCISION Left 2002  . KNEE SURGERY     right  . LAPAROSCOPIC SUPRACERVICAL HYSTERECTOMY  08/05/2013  . ORIF WRIST FRACTURE Left 01/17/2017   Procedure: OPEN REDUCTION INTERNAL FIXATION (ORIF) WRIST FRACTURE;   Surgeon: Lovell Sheehan, MD;  Location: [facility] ORS;  Service: Orthopedics;  Laterality: Left;  . TUBAL LIGATION Bilateral   . VAGINAL HYSTERECTOMY  03/2006    There were no vitals filed for this visit.  Subjective Assessment - 03/25/19 1315    Subjective  Patient reports compliance with HEP. Slight discomfort in R wrist with noted tightness of forearm, wearing wrist wrap with no pain at the moment.    Pertinent History  Pt is a 58 y/o F who presents with BLE weakness and difficulty ambulating due to h/o MS that was diagnosed in 1995. Pt denies any pain associated. Pt was admitted to the hospital in November 2018 due to MS exacerbation. Nov-Dec to HHPT-->[facility] until end of Jan. After rehab the pt was able to ambulate up to 100 ft with the RW. Pt has Bil AFO, her therapist at [place] thinks she needs a new AFO as her feet still drag when she ambulates, pt reports HHPT mentioned a spring AFO. Pt has custom AFOs that were originally made 4 years ago. Goals: improve balance, to be able to get into the shower with her tub bench without assist, to be able to get up 2 steps at sister's home with railings on both sides but too far apart to reach both sides, to be  able to drive adapted car. Pt is staying at other sister's house with one step to get inside and currently picking up L leg with UEs and then stepping in. Pt steps down leading with LLE. Pt wants to work on walking side to side as this is challenging and she had not yet progressed to this at rehab. Pt able to get in/out the car with assist only to bring BLEs into the car. Hoping to drive by June or July with adapted car (already has one). Pt needs assist bringing LEs into and out of hospital bed. Pt able to perform sit>stand without assist from [facility] with a pillow on the seat for a boost. Pt denies any falls in the past 6 months. Pt has been increasing her time alone at home up to a few hours at a time to become more independent. Pt has both manual  and power WC. Uses power WC at home, uses manual WC out in community. Pt able to self propel manual WC. Pt needs assist putting pants around ankles but she can pull them up. Pt can put on her shoes but needs help getting her leg up to do so. Pt able to tie her shoelaces on her own. Pt has a tub/shower unit at home with a tub bench. No grab bars in the shower. Has hand held shower head. Pt currently taking a sponge bath. Pt has a regular height toilet with a BSC over top. Pt with h/o L wrist fx with no precautions, pt wore her soft brace to evaluation for comfort but reports her wrist has healed and is doing well following OT.     Limitations  Lifting;Standing;Walking;House hold activities    How long can you sit comfortably?  n/a    How long can you stand comfortably?  3 minutes without UE assist, limited by fatigue    How long can you walk comfortably?  100 ft, limited by fatigue    Currently in Pain?  No/denies       Standing in // bars:   Standing weight shift with foot clearing BLE: 2 minutes  Tandem stance: modified, one foot forward, SUE support horizontal head turns 30 seconds- 1 minute.   Ambulate 2x length of // bars with focus on weight shift and hip flexion for foot clearance with CGA.     Seated:  Isometrics: -hip flexion into PT hands 12x 3 second holds each LE, single LE at a time -hip abduction into PT hands 12x 3 second holds with improved contraction with repetition -hip adduction into PT hands 12x 3 second holds with improved muscle contraction with repetition  STM to R forearm musculature with focus on trigger point reduction x 2 minutes  Cross body jabs with increasing difficulty and range, increasing challenge to L, R, duck for core stability and postural reactions. -Jab L , R duck for cross body coordination and postural control/righting reactions 10x -Jab R, L duck for cross body coordination and postural control/righting reactions 10x -Cross body elbowfor  abdominal strengthening and postural challenge x 60 seconds   Seated rest breaks and use of ice pack utilized to keep body temperature at functional range to reduce MS exacerbation.  Pt educated throughout session about proper posture and technique with exercises. Improved exercise technique, movement at target joints, use of target muscles after min to mod verbal, visual, tactile cues.   Patient's condition has the potential to improve in response to therapy. Maximum improvement is yet to be obtained. The  anticipated improvement is attainable and reasonable in a generally predictable time.  Patient reports she is improving with independent mobility, strength, and ability to remain standing.             PT Education - 03/25/19 1317    Education provided  Yes    Education Details  exercise technique, body mechanics    Person(s) Educated  Patient    Methods  Explanation;Demonstration;Tactile cues;Verbal cues    Comprehension  Verbalized understanding;Returned demonstration;Verbal cues required;Tactile cues required       PT Short Term Goals - 03/13/19 1733      PT SHORT TERM GOAL #1   Title  Patient will be compliant with HEP 4-5 days/week for improved carryover between sessions.     Baseline  HEP compliancy between sessions 6/17 compliant    Time  2    Period  Weeks    Status  Achieved    Target Date  10/15/18      PT SHORT TERM GOAL #2   Title  Patient will report no falls/LOB to indicate improved stability with mobility     Baseline  6/17: no falls, one episode of instability 7/20: no falls    Time  2    Period  Weeks    Status  Achieved    Target Date  11/06/18        PT Long Term Goals - 03/13/19 0001      PT LONG TERM GOAL #1   Title  Patient will perform TUG in 40 seconds or less with walker to improve safe negotiation of environment.     Baseline  11/5: 45 seconds 10/9: deferred due to pain 5/26: 61 seconds 6/17: 60 seconds 7/20: 59 seconds 8/26:  55  seconds    Time  8    Period  Weeks    Status  Partially Met    Target Date  05/08/19      PT LONG TERM GOAL #2   Title  Pt's 14mT will improve to at least 0.5 m/s for improved safety ambulating in home    Baseline  03/13/19: 0.30 m/s 0.14 m/s; 5/15: .242m  7/10: .36 m/s  8/20: .42 m/s  9/26: 0.2662m10/14: .26 m/s improved hip and knee clearance 11/15: 0.71m49mmproved hip and knee clearance; 12/5: .29 m/s improved step length and forward moment: decreased foot drag. 05/09/2018: 0.21 m/s 2/6: .237 m/s 5/26: .23 m/s 6/17: .24 m/s  7/20: 0.26 m/s 8/26: 0.26 m/s 10/8: 0.28 m/s    Time  8    Period  Weeks    Status  Partially Met    Target Date  05/08/19      PT LONG TERM GOAL #3   Title  Pt's Bil hip F strength will improve to at least 2+/5 BLEs for improved gait mechanics and safety    Baseline  11/5: Hips: 2/5 knees 3+/5 10/8: deferred due to pain L: 2/5, R: 1/5 7/10: 2/5 bilaterally  8/20: 2/5 bilat 9/26: 2/5 bilat 10/14: 2/5 bilaterally ,improved hip flexor muscle activation 2/5: improved hip flexor and quad activation: 12/5: 2/5 ; 05/09/2018: 2/5 2/6: 3/5 knee, 2 to 2+/5 hip 5/26: 2-/5 hips 3/5 knees 6/17: 2-/5 hips 3/5 knees    Time  8    Period  Weeks    Status  Partially Met    Target Date  05/08/19      PT LONG TERM GOAL #4   Title  Pt's ABC Scale will improve to  at least 40% to demonstrate improved balance    Baseline  11/5: 32.2% 10/9: 32.1% 28.13% 5/15; 24.4% 7/10: 23.4% 8/20: 22. 2% 9/26: 25.3% 10/14: 26.5% 11/15: 24.38% 12/5: 26%; 05/09/2018: 29% 2/6: 23.75% 526: 30% 6/17: 26.8%  7/2: 28% 8/26: 30.3%    Time  8    Period  Weeks    Status  Partially Met    Target Date  05/08/19      PT LONG TERM GOAL #5   Title  Patient will negotiate one step ~4 inches to enter house independently to increase functional independence in natural environment    Baseline  11/5: able to lift 1.75 inch 10/9: has lifted 1.5 inch in previous sessions /20: unable to do 9/26: patient too fatigued/not  feeling well to perform 10/14: unable to perform at this time 11/15: unable to perform at this time; 12/5: able to lift 1 inch; 05/09/2018: unable to clear 4" step, able to lift foot ~ 1in 5/26: has been unable to perform 6/17: able to lift foot 1.25 inch 8/26: has been able to step up onto 1.5 inch step in previous sessions    Time  8    Period  Weeks    Status  Partially Met    Target Date  05/08/19      PT LONG TERM GOAL #6   Title  Patient will negotiate 30 ft of changing surfaces outside such as pavement and parking lot to allow patient to walk into church.    Baseline  11/5; walks outside to cart at store ~20 ft 10/9: walks 20 ft outside at store now 12/5: does not walk outside 2/24: unable to test due to rain 5/26: unable since COVID 6/17: raining 7/20 too hot to test 8/26: unable to walk outside due to heat and COVID    Time  8    Period  Weeks    Status  Partially Met    Target Date  05/08/19      PT LONG TERM GOAL #7   Title  Patient will stand 5 minutes to increase standing duration to allow patient to return to washing dishes and clothes.     Baseline  11/5: able to perform duration of sponge bath in room ~9 minutes 10/9: deferred due to pain 12/5: 56 seconds; 05/09/2018: 1 min 18 sec 2/6: 2 minutes last session 5/26: 1 minute 36 seconds limited by breathing 6/17: terminated due to abdomen pain  7/20 terminated due to overheating 8/26: able to stand 3 minutes at home    Time  8    Period  Weeks    Status  Achieved      PT LONG TERM GOAL #8   Title  Patient will negotiate tub with mod I to be able to bath/shower in bathroom requiring stability and ability to negotiate small area.     Baseline  11/5: has to sponge bath standing up in room     Time  8    Period  Weeks    Status  New    Target Date  05/08/19            Plan - 03/25/19 1320    Clinical Impression Statement  Goals performed on 03/11/19, please refer to this note for further details on  progress.  Patient is  activating posterior chain muscles with improved quality and amplitude, lifting LLE for better clearance now. Patient's condition has the potential to improve in response to therapy. Maximum improvement is yet  to be obtained. The anticipated improvement is attainable and reasonable in a generally predictable time.The patient will continue to benefit from skilled therapeutic intervention to address deficits in strength, mobility, balance, and function for improved overall QOL and safety    Rehab Potential  Good    PT Frequency  2x / week    PT Duration  8 weeks    PT Treatment/Interventions  ADLs/Self Care Home Management;Aquatic Therapy;Biofeedback;Cryotherapy;Electrical Stimulation;Iontophoresis 25m/ml Dexamethasone;Traction;Moist Heat;Ultrasound;DME Instruction;Gait training;Stair training;Functional mobility training;Therapeutic activities;Therapeutic exercise;Balance training;Neuromuscular re-education;Patient/family education;Orthotic Fit/Training;Wheelchair mobility training;Manual techniques;Compression bandaging;Passive range of motion;Dry needling;Energy conservation;Taping;Splinting    PT Next Visit Plan  strength/balance    PT Home Exercise Plan  to initiate next session (please incorporate hip flexor and adductor stretches in HEP if possible)    Consulted and Agree with Plan of Care  Patient       Patient will benefit from skilled therapeutic intervention in order to improve the following deficits and impairments:  Abnormal gait, Decreased activity tolerance, Decreased balance, Decreased coordination, Decreased endurance, Decreased knowledge of use of DME, Decreased mobility, Decreased range of motion, Decreased safety awareness, Decreased strength, Difficulty walking, Impaired perceived functional ability, Impaired sensation, Impaired tone, Impaired UE functional use, Improper body mechanics, Postural dysfunction  Visit Diagnosis: Muscle weakness (generalized)  Other abnormalities of  gait and mobility  Unsteadiness on feet     Problem List Patient Active Problem List   Diagnosis Date Noted  . Abscess of female pelvis   . SVT (supraventricular tachycardia) ([place]   . Radial styloid tenosynovitis 03/12/2018  . Wheelchair confinement 02/27/2018  . Localized osteoporosis with current pathological fracture with routine healing 01/19/2017  . Wrist fracture 01/16/2017  . Sprain of ankle 03/23/2016  . Closed fracture of lateral malleolus 03/16/2016  . Health care maintenance 01/24/2016  . Blood pressure elevated without history of HTN 10/25/2015  . Essential hypertension 10/25/2015  . Multiple sclerosis ([place] 10/02/2015  . Chronic left shoulder pain 07/19/2015  . Multiple sclerosis exacerbation ([place] 07/14/2015  . MS (multiple sclerosis) ([place] 11/26/2014  . Increased body mass index 11/26/2014  . HPV test positive 11/26/2014  . Status post laparoscopic supracervical hysterectomy 11/26/2014  . Galactorrhea 11/26/2014  . Back ache 05/21/2014  . Adiposity 05/21/2014  . Disordered sleep 05/21/2014  . Muscle spasticity 05/21/2014  . Spasticity 05/21/2014  . Calculus of kidney 12/09/2013  . Renal colic 032/99/2426 . Hypercholesteremia 08/19/2013  . Hereditary and idiopathic neuropathy 08/19/2013  . Hypercholesterolemia without hypertriglyceridemia 08/19/2013  . Bladder infection, chronic 07/25/2012  . Disorder of bladder function 07/25/2012  . Incomplete bladder emptying 07/25/2012  . Microscopic hematuria 07/25/2012  . Right upper quadrant pain 07/25/2012   MJanna Arch PT, DPT   03/25/2019, 1:21 PM  [place] [facility]SERVICES [address]RPrairie du [place]ien [place] [address]Phone: [phone]  Fax:  [phone] Name: Lisa Williams MRN: [id number] of Birth: [birth year]

## 2019-03-27 ENCOUNTER — Ambulatory Visit: Payer: Medicare HMO

## 2019-03-27 ENCOUNTER — Other Ambulatory Visit: Payer: Self-pay

## 2019-03-27 DIAGNOSIS — R2689 Other abnormalities of gait and mobility: Secondary | ICD-10-CM

## 2019-03-27 DIAGNOSIS — M6281 Muscle weakness (generalized): Secondary | ICD-10-CM | POA: Diagnosis not present

## 2019-03-27 DIAGNOSIS — R2681 Unsteadiness on feet: Secondary | ICD-10-CM

## 2019-03-27 NOTE — Therapy (Signed)
[place] MAIN [facility] SERVICES [address] [place], [place], [address] Phone: [phone]   Fax:  [phone]  Physical Therapy Treatment  Patient Details  Name: Lisa Williams MRN: [id number] Date of Birth: Mar 17, 1961 Referring Provider (PT): Gurney Maxin, MD   Encounter Date: 03/27/2019  PT End of Session - 03/27/19 1519    Visit Number  [id number]    Number of Visits  143    Date for PT Re-Evaluation  05/08/19    Authorization Type  1/10 PN 03/25/19    PT Start Time  1030    PT Stop Time  1113    PT Time Calculation (min)  43 min    Equipment Utilized During Treatment  Gait belt    Activity Tolerance  Patient tolerated treatment well   heat intolerance   Behavior During Therapy  [facility] for tasks assessed/performed       Past Medical History:  Diagnosis Date  . Abdominal pain, right upper quadrant   . Back pain   . Calculus of kidney 12/09/2013  . Chronic back pain    unspecified  . Chronic left shoulder pain 07/19/2015  . Functional disorder of bladder    other  . Galactorrhea 11/26/2014   Chronic   . Hereditary and idiopathic neuropathy 08/19/2013  . HPV test positive   . Hypercholesteremia 08/19/2013  . Incomplete bladder emptying   . Microscopic hematuria   . MS (multiple sclerosis) ([place])   . Muscle spasticity 05/21/2014  . Nonspecific findings on examination of urine    other  . Osteopenia   . Status post laparoscopic supracervical hysterectomy 11/26/2014  . Tobacco user 11/26/2014  . Wrist fracture     Past Surgical History:  Procedure Laterality Date  . bilateral tubal ligation  1996  . BREAST CYST EXCISION Left 2002  . KNEE SURGERY     right  . LAPAROSCOPIC SUPRACERVICAL HYSTERECTOMY  08/05/2013  . ORIF WRIST FRACTURE Left 01/17/2017   Procedure: OPEN REDUCTION INTERNAL FIXATION (ORIF) WRIST FRACTURE;  Surgeon: Lovell Sheehan, MD;  Location: [facility] ORS;  Service: Orthopedics;  Laterality: Left;  . TUBAL LIGATION  Bilateral   . VAGINAL HYSTERECTOMY  03/2006    There were no vitals filed for this visit.  Subjective Assessment - 03/27/19 1430    Subjective  Patient reports compliance with HEP. No falls or LOB since last session.    Pertinent History  Pt is a 58 y/o F who presents with BLE weakness and difficulty ambulating due to h/o MS that was diagnosed in 1995. Pt denies any pain associated. Pt was admitted to the hospital in November 2018 due to MS exacerbation. Nov-Dec to HHPT-->[facility] until end of Jan. After rehab the pt was able to ambulate up to 100 ft with the RW. Pt has Bil AFO, her therapist at [facility] thinks she needs a new AFO as her feet still drag when she ambulates, pt reports HHPT mentioned a spring AFO. Pt has custom AFOs that were originally made 4 years ago. Goals: improve balance, to be able to get into the shower with her tub bench without assist, to be able to get up 2 steps at sister's home with railings on both sides but too far apart to reach both sides, to be able to drive adapted car. Pt is staying at other sister's house with one step to get inside and currently picking up L leg with UEs and then stepping in. Pt steps down leading with  LLE. Pt wants to work on walking side to side as this is challenging and she had not yet progressed to this at rehab. Pt able to get in/out the car with assist only to bring BLEs into the car. Hoping to drive by June or July with adapted car (already has one). Pt needs assist bringing LEs into and out of hospital bed. Pt able to perform sit>stand without assist from [facility] with a pillow on the seat for a boost. Pt denies any falls in the past 6 months. Pt has been increasing her time alone at home up to a few hours at a time to become more independent. Pt has both manual and power WC. Uses power WC at home, uses manual WC out in community. Pt able to self propel manual WC. Pt needs assist putting pants around ankles but she can pull them up. Pt can put  on her shoes but needs help getting her leg up to do so. Pt able to tie her shoelaces on her own. Pt has a tub/shower unit at home with a tub bench. No grab bars in the shower. Has hand held shower head. Pt currently taking a sponge bath. Pt has a regular height toilet with a BSC over top. Pt with h/o L wrist fx with no precautions, pt wore her soft brace to evaluation for comfort but reports her wrist has healed and is doing well following OT.     Limitations  Lifting;Standing;Walking;House hold activities    How long can you sit comfortably?  n/a    How long can you stand comfortably?  3 minutes without UE assist, limited by fatigue    How long can you walk comfortably?  100 ft, limited by fatigue    Currently in Pain?  No/denies             Standing with RW: -static stand 2x 30 seconds for upright posture and neutral midline correction -hip flexion /march with RW : slight ability to clear floor with LLE, 8x each LE   Ambulate 66 ft with RW and w/c follow with CGA. Able to perform with no episodes of foot scuffing. Cueing for weight shift and hip flex for foot clearance.     Weighted bar (5lb) -circles clockwise with straight arm 15x , counterclockwise 15x -row 15x -single arm row 15x very challenging -straight arm raise 10x -overhead press 10x   RTB hip abduction 10x  RTB df 10x each LE  Isometrics: -hip flexion into PT hands 12x 3 second holds each LE, single LE at a time -hip abduction into PT hands 12x 3 second holds with improved contraction with repetition -hip adduction into PT hands 12x 3 second holds with improved muscle contraction with repetition -knee extension 10x 3 second holds     Seated rest breaks and use of ice pack utilized to keep body temperature at functional range to reduce MS exacerbation.      Pt educated throughout session about proper posture and technique with exercises. Improved exercise technique, movement at target joints, use of target muscles  after min to mod verbal, visual, tactile cues.                    PT Education - 03/27/19 1430    Education provided  Yes    Education Details  exercise technique, body mechanics    Person(s) Educated  Patient    Methods  Explanation;Demonstration;Tactile cues;Verbal cues    Comprehension  Verbalized understanding;Returned demonstration;Verbal  cues required;Tactile cues required       PT Short Term Goals - 03/13/19 1733      PT SHORT TERM GOAL #1   Title  Patient will be compliant with HEP 4-5 days/week for improved carryover between sessions.     Baseline  HEP compliancy between sessions 6/17 compliant    Time  2    Period  Weeks    Status  Achieved    Target Date  10/15/18      PT SHORT TERM GOAL #2   Title  Patient will report no falls/LOB to indicate improved stability with mobility     Baseline  6/17: no falls, one episode of instability 7/20: no falls    Time  2    Period  Weeks    Status  Achieved    Target Date  11/06/18        PT Long Term Goals - 03/13/19 0001      PT LONG TERM GOAL #1   Title  Patient will perform TUG in 40 seconds or less with walker to improve safe negotiation of environment.     Baseline  11/5: 45 seconds 10/9: deferred due to pain 5/26: 61 seconds 6/17: 60 seconds 7/20: 59 seconds 8/26:  55 seconds    Time  8    Period  Weeks    Status  Partially Met    Target Date  05/08/19      PT LONG TERM GOAL #2   Title  Pt's 56mT will improve to at least 0.5 m/s for improved safety ambulating in home    Baseline  03/13/19: 0.30 m/s 0.14 m/s; 5/15: .261m  7/10: .36 m/s  8/20: .42 m/s  9/26: 0.2659m10/14: .26 m/s improved hip and knee clearance 11/15: 0.14m38mmproved hip and knee clearance; 12/5: .29 m/s improved step length and forward moment: decreased foot drag. 05/09/2018: 0.21 m/s 2/6: .237 m/s 5/26: .23 m/s 6/17: .24 m/s  7/20: 0.26 m/s 8/26: 0.26 m/s 10/8: 0.28 m/s    Time  8    Period  Weeks    Status  Partially Met     Target Date  05/08/19      PT LONG TERM GOAL #3   Title  Pt's Bil hip F strength will improve to at least 2+/5 BLEs for improved gait mechanics and safety    Baseline  11/5: Hips: 2/5 knees 3+/5 10/8: deferred due to pain L: 2/5, R: 1/5 7/10: 2/5 bilaterally  8/20: 2/5 bilat 9/26: 2/5 bilat 10/14: 2/5 bilaterally ,improved hip flexor muscle activation 2/5: improved hip flexor and quad activation: 12/5: 2/5 ; 05/09/2018: 2/5 2/6: 3/5 knee, 2 to 2+/5 hip 5/26: 2-/5 hips 3/5 knees 6/17: 2-/5 hips 3/5 knees    Time  8    Period  Weeks    Status  Partially Met    Target Date  05/08/19      PT LONG TERM GOAL #4   Title  Pt's ABC Scale will improve to at least 40% to demonstrate improved balance    Baseline  11/5: 32.2% 10/9: 32.1% 28.13% 5/15; 24.4% 7/10: 23.4% 8/20: 22. 2% 9/26: 25.3% 10/14: 26.5% 11/15: 24.38% 12/5: 26%; 05/09/2018: 29% 2/6: 23.75% 526: 30% 6/17: 26.8%  7/2: 28% 8/26: 30.3%    Time  8    Period  Weeks    Status  Partially Met    Target Date  05/08/19      PT LONG TERM GOAL #5  Title  Patient will negotiate one step ~4 inches to enter house independently to increase functional independence in natural environment    Baseline  11/5: able to lift 1.75 inch 10/9: has lifted 1.5 inch in previous sessions /20: unable to do 9/26: patient too fatigued/not feeling well to perform 10/14: unable to perform at this time 11/15: unable to perform at this time; 12/5: able to lift 1 inch; 05/09/2018: unable to clear 4" step, able to lift foot ~ 1in 5/26: has been unable to perform 6/17: able to lift foot 1.25 inch 8/26: has been able to step up onto 1.5 inch step in previous sessions    Time  8    Period  Weeks    Status  Partially Met    Target Date  05/08/19      PT LONG TERM GOAL #6   Title  Patient will negotiate 30 ft of changing surfaces outside such as pavement and parking lot to allow patient to walk into church.    Baseline  11/5; walks outside to cart at store ~20 ft 10/9: walks 20 ft  outside at store now 12/5: does not walk outside 2/24: unable to test due to rain 5/26: unable since COVID 6/17: raining 7/20 too hot to test 8/26: unable to walk outside due to heat and COVID    Time  8    Period  Weeks    Status  Partially Met    Target Date  05/08/19      PT LONG TERM GOAL #7   Title  Patient will stand 5 minutes to increase standing duration to allow patient to return to washing dishes and clothes.     Baseline  11/5: able to perform duration of sponge bath in room ~9 minutes 10/9: deferred due to pain 12/5: 56 seconds; 05/09/2018: 1 min 18 sec 2/6: 2 minutes last session 5/26: 1 minute 36 seconds limited by breathing 6/17: terminated due to abdomen pain  7/20 terminated due to overheating 8/26: able to stand 3 minutes at home    Time  8    Period  Weeks    Status  Achieved      PT LONG TERM GOAL #8   Title  Patient will negotiate tub with mod I to be able to bath/shower in bathroom requiring stability and ability to negotiate small area.     Baseline  11/5: has to sponge bath standing up in room     Time  8    Period  Weeks    Status  New    Target Date  05/08/19            Plan - 03/27/19 1524    Clinical Impression Statement  Patient presents with excellent motivation throughout physical therapy session. She continues to progress with functional strengthening and mobility with decreasing episodes of foot scuffing with ambulation. Isometric contractions are progressing with visible contractions. The patient will continue to benefit from skilled therapeutic intervention to address deficits in strength, mobility, balance, and function for improved overall QOL and safety    Rehab Potential  Good    PT Frequency  2x / week    PT Duration  8 weeks    PT Treatment/Interventions  ADLs/Self Care Home Management;Aquatic Therapy;Biofeedback;Cryotherapy;Electrical Stimulation;Iontophoresis 67m/ml Dexamethasone;Traction;Moist Heat;Ultrasound;DME Instruction;Gait  training;Stair training;Functional mobility training;Therapeutic activities;Therapeutic exercise;Balance training;Neuromuscular re-education;Patient/family education;Orthotic Fit/Training;Wheelchair mobility training;Manual techniques;Compression bandaging;Passive range of motion;Dry needling;Energy conservation;Taping;Splinting    PT Next Visit Plan  strength/balance    PT Home  Exercise Plan  to initiate next session (please incorporate hip flexor and adductor stretches in HEP if possible)    Consulted and Agree with Plan of Care  Patient       Patient will benefit from skilled therapeutic intervention in order to improve the following deficits and impairments:  Abnormal gait, Decreased activity tolerance, Decreased balance, Decreased coordination, Decreased endurance, Decreased knowledge of use of DME, Decreased mobility, Decreased range of motion, Decreased safety awareness, Decreased strength, Difficulty walking, Impaired perceived functional ability, Impaired sensation, Impaired tone, Impaired UE functional use, Improper body mechanics, Postural dysfunction  Visit Diagnosis: Muscle weakness (generalized)  Other abnormalities of gait and mobility  Unsteadiness on feet     Problem List Patient Active Problem List   Diagnosis Date Noted  . Abscess of female pelvis   . SVT (supraventricular tachycardia) ([place])   . Radial styloid tenosynovitis 03/12/2018  . Wheelchair confinement 02/27/2018  . Localized osteoporosis with current pathological fracture with routine healing 01/19/2017  . Wrist fracture 01/16/2017  . Sprain of ankle 03/23/2016  . Closed fracture of lateral malleolus 03/16/2016  . Health care maintenance 01/24/2016  . Blood pressure elevated without history of HTN 10/25/2015  . Essential hypertension 10/25/2015  . Multiple sclerosis ([place]) 10/02/2015  . Chronic left shoulder pain 07/19/2015  . Multiple sclerosis exacerbation ([place]) 07/14/2015  . MS (multiple sclerosis)  ([place]) 11/26/2014  . Increased body mass index 11/26/2014  . HPV test positive 11/26/2014  . Status post laparoscopic supracervical hysterectomy 11/26/2014  . Galactorrhea 11/26/2014  . Back ache 05/21/2014  . Adiposity 05/21/2014  . Disordered sleep 05/21/2014  . Muscle spasticity 05/21/2014  . Spasticity 05/21/2014  . Calculus of kidney 12/09/2013  . Renal colic 14/97/0263  . Hypercholesteremia 08/19/2013  . Hereditary and idiopathic neuropathy 08/19/2013  . Hypercholesterolemia without hypertriglyceridemia 08/19/2013  . Bladder infection, chronic 07/25/2012  . Disorder of bladder function 07/25/2012  . Incomplete bladder emptying 07/25/2012  . Microscopic hematuria 07/25/2012  . Right upper quadrant pain 07/25/2012   Janna Arch, PT, DPT   03/27/2019, 3:29 PM  [place] MAIN [facility] SERVICES [address] [place], [place], [address] Phone: [phone]   Fax:  [phone]  Name: Aneliz Carbary Holzschuh MRN: [id number] Date of Birth: [birth year]

## 2019-04-01 ENCOUNTER — Other Ambulatory Visit: Payer: Self-pay

## 2019-04-01 ENCOUNTER — Ambulatory Visit: Payer: Medicare HMO

## 2019-04-01 DIAGNOSIS — M6281 Muscle weakness (generalized): Secondary | ICD-10-CM | POA: Diagnosis not present

## 2019-04-01 DIAGNOSIS — R2689 Other abnormalities of gait and mobility: Secondary | ICD-10-CM

## 2019-04-01 DIAGNOSIS — R2681 Unsteadiness on feet: Secondary | ICD-10-CM

## 2019-04-01 NOTE — Therapy (Signed)
[facility] [address] [place], [place], [address] Phone: [phone]   Fax:  [phone]  Physical Therapy Treatment  Patient Details  Name: Lisa Williams MRN: [id number] Date of Birth: 02/13/1961 Referring Provider (PT): Zachary Potter, MD   Encounter Date: 04/01/2019  PT End of Session - 04/01/19 1514    Visit Number  132    Number of Visits  143    Date for PT Re-Evaluation  05/08/19    Authorization Type  2/10 PN 03/25/19    PT Start Time  1030    PT Stop Time  1114    PT Time Calculation (min)  44 min    Equipment Utilized During Treatment  Gait belt    Activity Tolerance  Patient tolerated treatment well   heat intolerance   Behavior During Therapy  WFL for tasks assessed/performed       Past Medical History:  Diagnosis Date  . Abdominal pain, right upper quadrant   . Back pain   . Calculus of kidney 12/09/2013  . Chronic back pain    unspecified  . Chronic left shoulder pain 07/19/2015  . Functional disorder of bladder    other  . Galactorrhea 11/26/2014   Chronic   . Hereditary and idiopathic neuropathy 08/19/2013  . HPV test positive   . Hypercholesteremia 08/19/2013  . Incomplete bladder emptying   . Microscopic hematuria   . MS (multiple sclerosis) ([facility])   . Muscle spasticity 05/21/2014  . Nonspecific findings on examination of urine    other  . Osteopenia   . Status post laparoscopic supracervical hysterectomy 11/26/2014  . Tobacco user 11/26/2014  . Wrist fracture     Past Surgical History:  Procedure Laterality Date  . bilateral tubal ligation  1996  . BREAST CYST EXCISION Left 2002  . KNEE SURGERY     right  . LAPAROSCOPIC SUPRACERVICAL HYSTERECTOMY  08/05/2013  . ORIF WRIST FRACTURE Left 01/17/2017   Procedure: OPEN REDUCTION INTERNAL FIXATION (ORIF) WRIST FRACTURE;  Surgeon: Bowers, James R, MD;  Location: [facility] ORS;  Service: Orthopedics;  Laterality: Left;  . TUBAL LIGATION  Bilateral   . VAGINAL HYSTERECTOMY  03/2006    There were no vitals filed for this visit.  Subjective Assessment - 04/01/19 1432    Subjective  Patietn reports compliance with HEP. Reports no falls or LOB since last session. No questions or concerns at this time.    Pertinent History  Pt is a 58 y/o F who presents with BLE weakness and difficulty ambulating due to h/o MS that was diagnosed in 1995. Pt denies any pain associated. Pt was admitted to the hospital in November 2018 due to MS exacerbation. Nov-Dec to [facility] until end of Jan. After rehab the pt was able to ambulate up to 100 ft with the RW. Pt has Bil AFO, her therapist at [facility] thinks she needs a new AFO as her feet still drag when she ambulates, pt reports HHPT mentioned a spring AFO. Pt has custom AFOs that were originally made 4 years ago. Goals: improve balance, to be able to get into the shower with her tub bench without assist, to be able to get up 2 steps at sister's home with railings on both sides but too far apart to reach both sides, to be able to drive adapted car. Pt is staying at other sister's house with one step to get inside and currently picking up L leg with UEs and   then stepping in. Pt steps down leading with LLE. Pt wants to work on walking side to side as this is challenging and she had not yet progressed to this at rehab. Pt able to get in/out the car with assist only to bring BLEs into the car. Hoping to drive by June or July with adapted car (already has one). Pt needs assist bringing LEs into and out of hospital bed. Pt able to perform sit>stand without assist from WC with a pillow on the seat for a boost. Pt denies any falls in the past 6 months. Pt has been increasing her time alone at home up to a few hours at a time to become more independent. Pt has both manual and power WC. Uses power WC at home, uses manual WC out in community. Pt able to self propel manual WC. Pt needs assist putting pants  around ankles but she can pull them up. Pt can put on her shoes but needs help getting her leg up to do so. Pt able to tie her shoelaces on her own. Pt has a tub/shower unit at home with a tub bench. No grab bars in the shower. Has hand held shower head. Pt currently taking a sponge bath. Pt has a regular height toilet with a BSC over top. Pt with h/o L wrist fx with no precautions, pt wore her soft brace to evaluation for comfort but reports her wrist has healed and is doing well following OT.     Limitations  Lifting;Standing;Walking;House hold activities    How long can you sit comfortably?  n/a    How long can you stand comfortably?  3 minutes without UE assist, limited by fatigue    How long can you walk comfortably?  100 ft, limited by fatigue    Currently in Pain?  No/denies          Standing in // bars: Large step from one line to another 7x each LE. Seated rest break. After completing patient reports feeling overheated and faint. Patient sat down, ice applied to back of neck and abdomen patient reports immediate relief. Rest with ice for cooling performed. Patietn reports her shirt is too heavy and thick for exercise. Rest of session performed in seated:  Seated:  Isometrics: -hip flexion into PT hands 12x 3 second holds each LE, single LE at a time -hip abduction into PT hands 12x 3 second holds with improved contraction with repetition -hip adduction into PT hands 12x 3 second holds with improved muscle contraction with repetition -LAQ into PT hands 12x 3 second holds,  -hamstring curl into PT hand 12x 3 second holds  Middle delt lateral raises with elbows flexed 12x   21's: opp UE raised entire time: lateral straight arm raise 7, 45 degree straight arm raise 7x, forwards straight arm raise 7x   Seated rest breaks and use of ice pack utilized to keep body temperature at functional range to reduce MS exacerbation.  Pt educated throughout session about proper posture and  technique with exercises. Improved exercise technique, movement at target joints, use of target muscles after min to mod verbal, visual, tactile cues.                     PT Education - 04/01/19 1510    Education provided  Yes    Education Details  exercise technique, body mechanics, overheating    Person(s) Educated  Patient    Methods  Explanation;Demonstration;Tactile cues;Verbal cues      Comprehension  Verbalized understanding;Returned demonstration;Verbal cues required       PT Short Term Goals - 03/13/19 1733      PT SHORT TERM GOAL #1   Title  Patient will be compliant with HEP 4-5 days/week for improved carryover between sessions.     Baseline  HEP compliancy between sessions 6/17 compliant    Time  2    Period  Weeks    Status  Achieved    Target Date  10/15/18      PT SHORT TERM GOAL #2   Title  Patient will report no falls/LOB to indicate improved stability with mobility     Baseline  6/17: no falls, one episode of instability 7/20: no falls    Time  2    Period  Weeks    Status  Achieved    Target Date  11/06/18        PT Long Term Goals - 03/13/19 0001      PT LONG TERM GOAL #1   Title  Patient will perform TUG in 40 seconds or less with walker to improve safe negotiation of environment.     Baseline  11/5: 45 seconds 10/9: deferred due to pain 5/26: 61 seconds 6/17: 60 seconds 7/20: 59 seconds 8/26:  55 seconds    Time  8    Period  Weeks    Status  Partially Met    Target Date  05/08/19      PT LONG TERM GOAL #2   Title  Pt's 10mWT will improve to at least 0.5 m/s for improved safety ambulating in home    Baseline  03/13/19: 0.30 m/s 0.14 m/s; 5/15: .26m/s  7/10: .36 m/s  8/20: .42 m/s  9/26: 0.26m/s 10/14: .26 m/s improved hip and knee clearance 11/15: 0.28m/s improved hip and knee clearance; 12/5: .29 m/s improved step length and forward moment: decreased foot drag. 05/09/2018: 0.21 m/s 2/6: .237 m/s 5/26: .23 m/s 6/17: .24 m/s  7/20: 0.26  m/s 8/26: 0.26 m/s 10/8: 0.28 m/s    Time  8    Period  Weeks    Status  Partially Met    Target Date  05/08/19      PT LONG TERM GOAL #3   Title  Pt's Bil hip F strength will improve to at least 2+/5 BLEs for improved gait mechanics and safety    Baseline  11/5: Hips: 2/5 knees 3+/5 10/8: deferred due to pain L: 2/5, R: 1/5 7/10: 2/5 bilaterally  8/20: 2/5 bilat 9/26: 2/5 bilat 10/14: 2/5 bilaterally ,improved hip flexor muscle activation 2/5: improved hip flexor and quad activation: 12/5: 2/5 ; 05/09/2018: 2/5 2/6: 3/5 knee, 2 to 2+/5 hip 5/26: 2-/5 hips 3/5 knees 6/17: 2-/5 hips 3/5 knees    Time  8    Period  Weeks    Status  Partially Met    Target Date  05/08/19      PT LONG TERM GOAL #4   Title  Pt's ABC Scale will improve to at least 40% to demonstrate improved balance    Baseline  11/5: 32.2% 10/9: 32.1% 28.13% 5/15; 24.4% 7/10: 23.4% 8/20: 22. 2% 9/26: 25.3% 10/14: 26.5% 11/15: 24.38% 12/5: 26%; 05/09/2018: 29% 2/6: 23.75% 526: 30% 6/17: 26.8%  7/2: 28% 8/26: 30.3%    Time  8    Period  Weeks    Status  Partially Met    Target Date  05/08/19      PT LONG TERM GOAL #  5   Title  Patient will negotiate one step ~4 inches to enter house independently to increase functional independence in natural environment    Baseline  11/5: able to lift 1.75 inch 10/9: has lifted 1.5 inch in previous sessions /20: unable to do 9/26: patient too fatigued/not feeling well to perform 10/14: unable to perform at this time 11/15: unable to perform at this time; 12/5: able to lift 1 inch; 05/09/2018: unable to clear 4" step, able to lift foot ~ 1in 5/26: has been unable to perform 6/17: able to lift foot 1.25 inch 8/26: has been able to step up onto 1.5 inch step in previous sessions    Time  8    Period  Weeks    Status  Partially Met    Target Date  05/08/19      PT LONG TERM GOAL #6   Title  Patient will negotiate 30 ft of changing surfaces outside such as pavement and parking lot to allow patient to  walk into church.    Baseline  11/5; walks outside to cart at store ~20 ft 10/9: walks 20 ft outside at store now 12/5: does not walk outside 2/24: unable to test due to rain 5/26: unable since COVID 6/17: raining 7/20 too hot to test 8/26: unable to walk outside due to heat and COVID    Time  8    Period  Weeks    Status  Partially Met    Target Date  05/08/19      PT LONG TERM GOAL #7   Title  Patient will stand 5 minutes to increase standing duration to allow patient to return to washing dishes and clothes.     Baseline  11/5: able to perform duration of sponge bath in room ~9 minutes 10/9: deferred due to pain 12/5: 56 seconds; 05/09/2018: 1 min 18 sec 2/6: 2 minutes last session 5/26: 1 minute 36 seconds limited by breathing 6/17: terminated due to abdomen pain  7/20 terminated due to overheating 8/26: able to stand 3 minutes at home    Time  8    Period  Weeks    Status  Achieved      PT LONG TERM GOAL #8   Title  Patient will negotiate tub with mod I to be able to bath/shower in bathroom requiring stability and ability to negotiate small area.     Baseline  11/5: has to sponge bath standing up in room     Time  8    Period  Weeks    Status  New    Target Date  05/08/19            Plan - 04/01/19 1514    Clinical Impression Statement  Patient experienced episode of overheating requiring ice to be applied to back of neck and abdomen for temperature reduction. Patient reports return to normal and states she must have been wearing too thick of a shirt. Seated interventions performed without another incidence of overheating. The patient will continue to benefit from skilled therapeutic intervention to address deficits in strength, mobility, balance, and function for improved overall QOL and safety    Rehab Potential  Good    PT Frequency  2x / week    PT Duration  8 weeks    PT Treatment/Interventions  ADLs/Self Care Home Management;Aquatic Therapy;Biofeedback;Cryotherapy;Electrical  Stimulation;Iontophoresis 4mg/ml Dexamethasone;Traction;Moist Heat;Ultrasound;DME Instruction;Gait training;Stair training;Functional mobility training;Therapeutic activities;Therapeutic exercise;Balance training;Neuromuscular re-education;Patient/family education;Orthotic Fit/Training;Wheelchair mobility training;Manual techniques;Compression bandaging;Passive range of motion;Dry needling;Energy conservation;Taping;Splinting      PT Next Visit Plan  strength/balance    PT Home Exercise Plan  to initiate next session (please incorporate hip flexor and adductor stretches in HEP if possible)    Consulted and Agree with Plan of Care  Patient       Patient will benefit from skilled therapeutic intervention in order to improve the following deficits and impairments:  Abnormal gait, Decreased activity tolerance, Decreased balance, Decreased coordination, Decreased endurance, Decreased knowledge of use of DME, Decreased mobility, Decreased range of motion, Decreased safety awareness, Decreased strength, Difficulty walking, Impaired perceived functional ability, Impaired sensation, Impaired tone, Impaired UE functional use, Improper body mechanics, Postural dysfunction  Visit Diagnosis: Muscle weakness (generalized)  Other abnormalities of gait and mobility  Unsteadiness on feet     Problem List Patient Active Problem List   Diagnosis Date Noted  . Abscess of female pelvis   . SVT (supraventricular tachycardia) ([place])   . Radial styloid tenosynovitis 03/12/2018  . Wheelchair confinement 02/27/2018  . Localized osteoporosis with current pathological fracture with routine healing 01/19/2017  . Wrist fracture 01/16/2017  . Sprain of ankle 03/23/2016  . Closed fracture of lateral malleolus 03/16/2016  . Health care maintenance 01/24/2016  . Blood pressure elevated without history of HTN 10/25/2015  . Essential hypertension 10/25/2015  . Multiple sclerosis ([place]) 10/02/2015  . Chronic left shoulder  pain 07/19/2015  . Multiple sclerosis exacerbation ([place]) 07/14/2015  . MS (multiple sclerosis) ([place]) 11/26/2014  . Increased body mass index 11/26/2014  . HPV test positive 11/26/2014  . Status post laparoscopic supracervical hysterectomy 11/26/2014  . Galactorrhea 11/26/2014  . Back ache 05/21/2014  . Adiposity 05/21/2014  . Disordered sleep 05/21/2014  . Muscle spasticity 05/21/2014  . Spasticity 05/21/2014  . Calculus of kidney 12/09/2013  . Renal colic 10/29/7626  . Hypercholesteremia 08/19/2013  . Hereditary and idiopathic neuropathy 08/19/2013  . Hypercholesterolemia without hypertriglyceridemia 08/19/2013  . Bladder infection, chronic 07/25/2012  . Disorder of bladder function 07/25/2012  . Incomplete bladder emptying 07/25/2012  . Microscopic hematuria 07/25/2012  . Right upper quadrant pain 07/25/2012   Janna Arch, PT, DPT   04/01/2019, 3:15 PM  [place] [facility] [address] [place], [place], [address] Phone: [phone]   Fax:  [phone]  Name: Lisa Williams MRN: [id number] Date of Birth: Nov 17, 1960

## 2019-04-03 IMAGING — DX DG CHEST 1V PORT
1 series · 1 of 1 positions shown · non-contrast
Comparison: 10/02/2015

CLINICAL DATA: Preop

EXAM:
PORTABLE CHEST 1 VIEW

[chest ap]
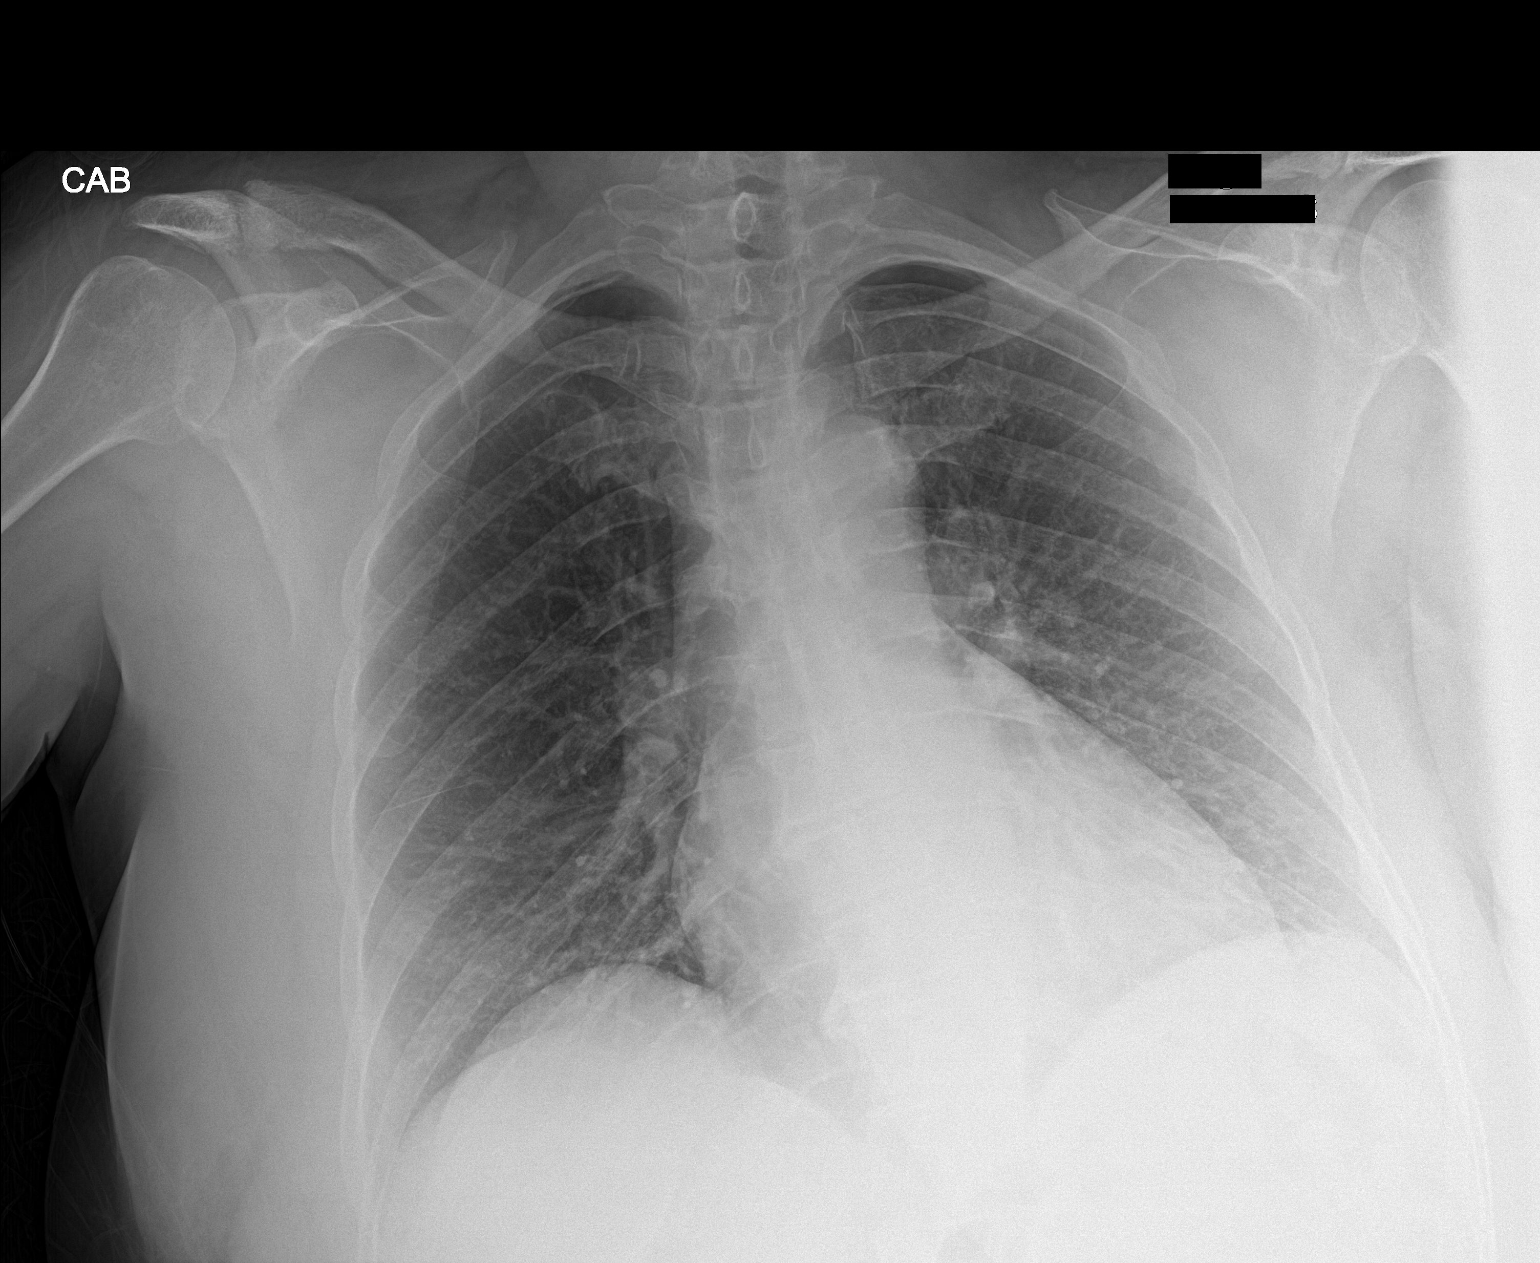

[1 of 1 positions shown; findings below may reference images not displayed]

FINDINGS: Heart is borderline in size. Lungs are clear. No effusions or edema.
No acute bony abnormality.
IMPRESSION: Borderline heart size.  No active disease.

## 2019-04-08 ENCOUNTER — Other Ambulatory Visit: Payer: Self-pay

## 2019-04-08 ENCOUNTER — Ambulatory Visit: Payer: Medicare HMO | Attending: Neurology

## 2019-04-08 DIAGNOSIS — R2689 Other abnormalities of gait and mobility: Secondary | ICD-10-CM | POA: Diagnosis present

## 2019-04-08 DIAGNOSIS — M6281 Muscle weakness (generalized): Secondary | ICD-10-CM | POA: Insufficient documentation

## 2019-04-08 DIAGNOSIS — R2681 Unsteadiness on feet: Secondary | ICD-10-CM | POA: Diagnosis present

## 2019-04-08 NOTE — Therapy (Signed)
[place] MAIN [facility] SERVICES [address] [place], [place], [address] Phone: [phone]   Fax:  [phone]  Physical Therapy Treatment  Patient Details  Name: Lisa Williams MRN: [id number] Date of Birth: Apr 27, 1961 Referring Provider (PT): Gurney Maxin, MD   Encounter Date: 04/08/2019  PT End of Session - 04/08/19 1254    Visit Number  [id number]    Number of Visits  143    Date for PT Re-Evaluation  05/08/19    Authorization Type  3/10 PN 03/25/19    PT Start Time  1029    PT Stop Time  1108    PT Time Calculation (min)  39 min    Equipment Utilized During Treatment  Gait belt    Activity Tolerance  Patient tolerated treatment well   heat intolerance   Behavior During Therapy  [facility] for tasks assessed/performed       Past Medical History:  Diagnosis Date  . Abdominal pain, right upper quadrant   . Back pain   . Calculus of kidney 12/09/2013  . Chronic back pain    unspecified  . Chronic left shoulder pain 07/19/2015  . Functional disorder of bladder    other  . Galactorrhea 11/26/2014   Chronic   . Hereditary and idiopathic neuropathy 08/19/2013  . HPV test positive   . Hypercholesteremia 08/19/2013  . Incomplete bladder emptying   . Microscopic hematuria   . MS (multiple sclerosis) ([place])   . Muscle spasticity 05/21/2014  . Nonspecific findings on examination of urine    other  . Osteopenia   . Status post laparoscopic supracervical hysterectomy 11/26/2014  . Tobacco user 11/26/2014  . Wrist fracture     Past Surgical History:  Procedure Laterality Date  . bilateral tubal ligation  1996  . BREAST CYST EXCISION Left 2002  . KNEE SURGERY     right  . LAPAROSCOPIC SUPRACERVICAL HYSTERECTOMY  08/05/2013  . ORIF WRIST FRACTURE Left 01/17/2017   Procedure: OPEN REDUCTION INTERNAL FIXATION (ORIF) WRIST FRACTURE;  Surgeon: Lovell Sheehan, MD;  Location: [facility] ORS;  Service: Orthopedics;  Laterality: Left;  . TUBAL LIGATION  Bilateral   . VAGINAL HYSTERECTOMY  03/2006    There were no vitals filed for this visit.  Subjective Assessment - 04/08/19 1248    Subjective  Patient reports feeling a little stiffer with the cold weather. Has been trying to eat healthier. No falls or LOB since last session.    Pertinent History  Pt is a 58 y/o F who presents with BLE weakness and difficulty ambulating due to h/o MS that was diagnosed in 1995. Pt denies any pain associated. Pt was admitted to the hospital in November 2018 due to MS exacerbation. Nov-Dec to HHPT-->[facility] until end of Jan. After rehab the pt was able to ambulate up to 100 ft with the RW. Pt has Bil AFO, her therapist at [facility] thinks she needs a new AFO as her feet still drag when she ambulates, pt reports HHPT mentioned a spring AFO. Pt has custom AFOs that were originally made 4 years ago. Goals: improve balance, to be able to get into the shower with her tub bench without assist, to be able to get up 2 steps at sister's home with railings on both sides but too far apart to reach both sides, to be able to drive adapted car. Pt is staying at other sister's house with one step to get inside and currently picking up L leg  with UEs and then stepping in. Pt steps down leading with LLE. Pt wants to work on walking side to side as this is challenging and she had not yet progressed to this at rehab. Pt able to get in/out the car with assist only to bring BLEs into the car. Hoping to drive by June or July with adapted car (already has one). Pt needs assist bringing LEs into and out of hospital bed. Pt able to perform sit>stand without assist from [facility] with a pillow on the seat for a boost. Pt denies any falls in the past 6 months. Pt has been increasing her time alone at home up to a few hours at a time to become more independent. Pt has both manual and power WC. Uses power WC at home, uses manual WC out in community. Pt able to self propel manual WC. Pt needs assist  putting pants around ankles but she can pull them up. Pt can put on her shoes but needs help getting her leg up to do so. Pt able to tie her shoelaces on her own. Pt has a tub/shower unit at home with a tub bench. No grab bars in the shower. Has hand held shower head. Pt currently taking a sponge bath. Pt has a regular height toilet with a BSC over top. Pt with h/o L wrist fx with no precautions, pt wore her soft brace to evaluation for comfort but reports her wrist has healed and is doing well following OT.     Limitations  Lifting;Standing;Walking;House hold activities    How long can you sit comfortably?  n/a    How long can you stand comfortably?  3 minutes without UE assist, limited by fatigue    How long can you walk comfortably?  100 ft, limited by fatigue    Currently in Pain?  No/denies        standing terminated HR 114 with Spo2 94%    Seated BP s/p standing: 123/58 pulse 82   Patient educated on need to consume more water, monitor HR and BP at home. Educated on signs and symptoms that will require her to seek medical attention.   Seated:  Isometrics: -hip flexion into PT hands 12x 3 second holds each LE, single LE at a time -hip abduction into PT hands 12x 3 second holds with improved contraction with repetition -hip adduction into PT hands 12x 3 second holds with improved muscle contraction with repetition -LAQ into PT hands 12x 3 second holds,  -hamstring curl into PT hand 12x 3 second holds  Weighted ball (2000gr) -chest press 15x -diagonal PNF 10x each direction -straight arm raise 10x   Seated rest breaks and use of ice pack utilized to keep body temperature at functional range to reduce MS exacerbation.  Pt educated throughout session about proper posture and technique with exercises. Improved exercise technique, movement at target joints, use of target muscles after min to mod verbal, visual, tactile cues.  Patient unable to tolerate standing this session.  Patient educated on need to consume more water, monitor HR and BP at home. Educated on signs and symptoms that will require her to seek medical attention. Patient tolerated seated interventions well. The patient will continue to benefit from skilled therapeutic intervention to address deficits in strength, mobility, balance, and function for improved overall QOL and safety                    PT Education - 04/08/19 1249  Education provided  Yes    Education Details  monitor HR and BP after lunch.    Person(s) Educated  Patient    Methods  Explanation    Comprehension  Verbalized understanding       PT Short Term Goals - 03/13/19 1733      PT SHORT TERM GOAL #1   Title  Patient will be compliant with HEP 4-5 days/week for improved carryover between sessions.     Baseline  HEP compliancy between sessions 6/17 compliant    Time  2    Period  Weeks    Status  Achieved    Target Date  10/15/18      PT SHORT TERM GOAL #2   Title  Patient will report no falls/LOB to indicate improved stability with mobility     Baseline  6/17: no falls, one episode of instability 7/20: no falls    Time  2    Period  Weeks    Status  Achieved    Target Date  11/06/18        PT Long Term Goals - 03/13/19 0001      PT LONG TERM GOAL #1   Title  Patient will perform TUG in 40 seconds or less with walker to improve safe negotiation of environment.     Baseline  11/5: 45 seconds 10/9: deferred due to pain 5/26: 61 seconds 6/17: 60 seconds 7/20: 59 seconds 8/26:  55 seconds    Time  8    Period  Weeks    Status  Partially Met    Target Date  05/08/19      PT LONG TERM GOAL #2   Title  Pt's 30mT will improve to at least 0.5 m/s for improved safety ambulating in home    Baseline  03/13/19: 0.30 m/s 0.14 m/s; 5/15: .242m  7/10: .36 m/s  8/20: .42 m/s  9/26: 0.265m10/14: .26 m/s improved hip and knee clearance 11/15: 0.79m2mmproved hip and knee clearance; 12/5: .29 m/s improved step  length and forward moment: decreased foot drag. 05/09/2018: 0.21 m/s 2/6: .237 m/s 5/26: .23 m/s 6/17: .24 m/s  7/20: 0.26 m/s 8/26: 0.26 m/s 10/8: 0.28 m/s    Time  8    Period  Weeks    Status  Partially Met    Target Date  05/08/19      PT LONG TERM GOAL #3   Title  Pt's Bil hip F strength will improve to at least 2+/5 BLEs for improved gait mechanics and safety    Baseline  11/5: Hips: 2/5 knees 3+/5 10/8: deferred due to pain L: 2/5, R: 1/5 7/10: 2/5 bilaterally  8/20: 2/5 bilat 9/26: 2/5 bilat 10/14: 2/5 bilaterally ,improved hip flexor muscle activation 2/5: improved hip flexor and quad activation: 12/5: 2/5 ; 05/09/2018: 2/5 2/6: 3/5 knee, 2 to 2+/5 hip 5/26: 2-/5 hips 3/5 knees 6/17: 2-/5 hips 3/5 knees    Time  8    Period  Weeks    Status  Partially Met    Target Date  05/08/19      PT LONG TERM GOAL #4   Title  Pt's ABC Scale will improve to at least 40% to demonstrate improved balance    Baseline  11/5: 32.2% 10/9: 32.1% 28.13% 5/15; 24.4% 7/10: 23.4% 8/20: 22. 2% 9/26: 25.3% 10/14: 26.5% 11/15: 24.38% 12/5: 26%; 05/09/2018: 29% 2/6: 23.75% 526: 30% 6/17: 26.8%  7/2: 28% 8/26: 30.3%    Time  8  Period  Weeks    Status  Partially Met    Target Date  05/08/19      PT LONG TERM GOAL #5   Title  Patient will negotiate one step ~4 inches to enter house independently to increase functional independence in natural environment    Baseline  11/5: able to lift 1.75 inch 10/9: has lifted 1.5 inch in previous sessions /20: unable to do 9/26: patient too fatigued/not feeling well to perform 10/14: unable to perform at this time 11/15: unable to perform at this time; 12/5: able to lift 1 inch; 05/09/2018: unable to clear 4" step, able to lift foot ~ 1in 5/26: has been unable to perform 6/17: able to lift foot 1.25 inch 8/26: has been able to step up onto 1.5 inch step in previous sessions    Time  8    Period  Weeks    Status  Partially Met    Target Date  05/08/19      PT LONG TERM GOAL #6    Title  Patient will negotiate 30 ft of changing surfaces outside such as pavement and parking lot to allow patient to walk into church.    Baseline  11/5; walks outside to cart at store ~20 ft 10/9: walks 20 ft outside at store now 12/5: does not walk outside 2/24: unable to test due to rain 5/26: unable since COVID 6/17: raining 7/20 too hot to test 8/26: unable to walk outside due to heat and COVID    Time  8    Period  Weeks    Status  Partially Met    Target Date  05/08/19      PT LONG TERM GOAL #7   Title  Patient will stand 5 minutes to increase standing duration to allow patient to return to washing dishes and clothes.     Baseline  11/5: able to perform duration of sponge bath in room ~9 minutes 10/9: deferred due to pain 12/5: 56 seconds; 05/09/2018: 1 min 18 sec 2/6: 2 minutes last session 5/26: 1 minute 36 seconds limited by breathing 6/17: terminated due to abdomen pain  7/20 terminated due to overheating 8/26: able to stand 3 minutes at home    Time  8    Period  Weeks    Status  Achieved      PT LONG TERM GOAL #8   Title  Patient will negotiate tub with mod I to be able to bath/shower in bathroom requiring stability and ability to negotiate small area.     Baseline  11/5: has to sponge bath standing up in room     Time  8    Period  Weeks    Status  New    Target Date  05/08/19            Plan - 04/08/19 1255    Clinical Impression Statement  Patient unable to tolerate standing this session. Patient educated on need to consume more water, monitor HR and BP at home. Educated on signs and symptoms that will require her to seek medical attention. Patient tolerated seated interventions well. The patient will continue to benefit from skilled therapeutic intervention to address deficits in strength, mobility, balance, and function for improved overall QOL and safety    Rehab Potential  Good    PT Frequency  2x / week    PT Duration  8 weeks    PT Treatment/Interventions   ADLs/Self Care Home Management;Aquatic Therapy;Biofeedback;Cryotherapy;Electrical Stimulation;Iontophoresis 2m/ml Dexamethasone;Traction;Moist  Heat;Ultrasound;DME Instruction;Gait training;Stair training;Functional mobility training;Therapeutic activities;Therapeutic exercise;Balance training;Neuromuscular re-education;Patient/family education;Orthotic Fit/Training;Wheelchair mobility training;Manual techniques;Compression bandaging;Passive range of motion;Dry needling;Energy conservation;Taping;Splinting    PT Next Visit Plan  strength/balance    PT Home Exercise Plan  to initiate next session (please incorporate hip flexor and adductor stretches in HEP if possible)    Consulted and Agree with Plan of Care  Patient       Patient will benefit from skilled therapeutic intervention in order to improve the following deficits and impairments:  Abnormal gait, Decreased activity tolerance, Decreased balance, Decreased coordination, Decreased endurance, Decreased knowledge of use of DME, Decreased mobility, Decreased range of motion, Decreased safety awareness, Decreased strength, Difficulty walking, Impaired perceived functional ability, Impaired sensation, Impaired tone, Impaired UE functional use, Improper body mechanics, Postural dysfunction  Visit Diagnosis: Muscle weakness (generalized)  Other abnormalities of gait and mobility  Unsteadiness on feet     Problem List Patient Active Problem List   Diagnosis Date Noted  . Abscess of female pelvis   . SVT (supraventricular tachycardia) ([place])   . Radial styloid tenosynovitis 03/12/2018  . Wheelchair confinement 02/27/2018  . Localized osteoporosis with current pathological fracture with routine healing 01/19/2017  . Wrist fracture 01/16/2017  . Sprain of ankle 03/23/2016  . Closed fracture of lateral malleolus 03/16/2016  . Health care maintenance 01/24/2016  . Blood pressure elevated without history of HTN 10/25/2015  . Essential  hypertension 10/25/2015  . Multiple sclerosis ([place]) 10/02/2015  . Chronic left shoulder pain 07/19/2015  . Multiple sclerosis exacerbation ([place]) 07/14/2015  . MS (multiple sclerosis) ([place]) 11/26/2014  . Increased body mass index 11/26/2014  . HPV test positive 11/26/2014  . Status post laparoscopic supracervical hysterectomy 11/26/2014  . Galactorrhea 11/26/2014  . Back ache 05/21/2014  . Adiposity 05/21/2014  . Disordered sleep 05/21/2014  . Muscle spasticity 05/21/2014  . Spasticity 05/21/2014  . Calculus of kidney 12/09/2013  . Renal colic 82/10/154  . Hypercholesteremia 08/19/2013  . Hereditary and idiopathic neuropathy 08/19/2013  . Hypercholesterolemia without hypertriglyceridemia 08/19/2013  . Bladder infection, chronic 07/25/2012  . Disorder of bladder function 07/25/2012  . Incomplete bladder emptying 07/25/2012  . Microscopic hematuria 07/25/2012  . Right upper quadrant pain 07/25/2012   Janna Arch, PT, DPT   04/08/2019, 12:56 PM  [place] MAIN [facility] SERVICES [address] [place], [place], [address] Phone: [phone]   Fax:  [phone]  Name: Lisa Williams MRN: [id number] Date of Birth: 1960/08/02

## 2019-04-10 ENCOUNTER — Other Ambulatory Visit: Payer: Self-pay

## 2019-04-10 ENCOUNTER — Ambulatory Visit: Payer: Medicare HMO

## 2019-04-10 DIAGNOSIS — M6281 Muscle weakness (generalized): Secondary | ICD-10-CM | POA: Diagnosis not present

## 2019-04-10 DIAGNOSIS — R2689 Other abnormalities of gait and mobility: Secondary | ICD-10-CM

## 2019-04-10 DIAGNOSIS — R2681 Unsteadiness on feet: Secondary | ICD-10-CM

## 2019-04-10 NOTE — Therapy (Signed)
[place] [facility] SERVICES [place], [place], [address] Phone: [phone]   Fax:  [phone]  Physical Therapy Treatment  Patient Details  Name: Lisa Williams MRN: [id number] Date of Birth: 11-24-1960 Referring Provider (PT): Gurney Maxin, MD   Encounter Date: 04/10/2019  PT End of Session - 04/10/19 1225    Visit Number  [id number]    Number of Visits  143    Date for PT Re-Evaluation  05/08/19    Authorization Type  4/10 PN 03/25/19    PT Start Time  1024    PT Stop Time  1108    PT Time Calculation (min)  44 min    Equipment Utilized During Treatment  Gait belt    Activity Tolerance  Patient tolerated treatment well   heat intolerance   Behavior During Therapy  [facility] for tasks assessed/performed       Past Medical History:  Diagnosis Date  . Abdominal pain, right upper quadrant   . Back pain   . Calculus of kidney 12/09/2013  . Chronic back pain    unspecified  . Chronic left shoulder pain 07/19/2015  . Functional disorder of bladder    other  . Galactorrhea 11/26/2014   Chronic   . Hereditary and idiopathic neuropathy 08/19/2013  . HPV test positive   . Hypercholesteremia 08/19/2013  . Incomplete bladder emptying   . Microscopic hematuria   . MS (multiple sclerosis) ([place])   . Muscle spasticity 05/21/2014  . Nonspecific findings on examination of urine    other  . Osteopenia   . Status post laparoscopic supracervical hysterectomy 11/26/2014  . Tobacco user 11/26/2014  . Wrist fracture     Past Surgical History:  Procedure Laterality Date  . bilateral tubal ligation  1996  . BREAST CYST EXCISION Left 2002  . KNEE SURGERY     right  . LAPAROSCOPIC SUPRACERVICAL HYSTERECTOMY  08/05/2013  . ORIF WRIST FRACTURE Left 01/17/2017   Procedure: OPEN REDUCTION INTERNAL FIXATION (ORIF) WRIST FRACTURE;  Surgeon: Lovell Sheehan, MD;  Location: [facility] ORS;  Service: Orthopedics;  Laterality: Left;  . TUBAL LIGATION  Bilateral   . VAGINAL HYSTERECTOMY  03/2006    There were no vitals filed for this visit.  Subjective Assessment - 04/10/19 1224    Subjective  Patient reports feeling lateral trunk pain and stiffness due to the cold weather. no falls or LOB since last session.    Pertinent History  Pt is a 58 y/o F who presents with BLE weakness and difficulty ambulating due to h/o MS that was diagnosed in 1995. Pt denies any pain associated. Pt was admitted to the hospital in November 2018 due to MS exacerbation. Nov-Dec to HHPT-->[facility] until end of Jan. After rehab the pt was able to ambulate up to 100 ft with the RW. Pt has Bil AFO, her therapist at [facility] thinks she needs a new AFO as her feet still drag when she ambulates, pt reports HHPT mentioned a spring AFO. Pt has custom AFOs that were originally made 4 years ago. Goals: improve balance, to be able to get into the shower with her tub bench without assist, to be able to get up 2 steps at sister's home with railings on both sides but too far apart to reach both sides, to be able to drive adapted car. Pt is staying at other sister's house with one step to get inside and currently picking up L leg with UEs and  then stepping in. Pt steps down leading with LLE. Pt wants to work on walking side to side as this is challenging and she had not yet progressed to this at rehab. Pt able to get in/out the car with assist only to bring BLEs into the car. Hoping to drive by June or July with adapted car (already has one). Pt needs assist bringing LEs into and out of hospital bed. Pt able to perform sit>stand without assist from [facility] with a pillow on the seat for a boost. Pt denies any falls in the past 6 months. Pt has been increasing her time alone at home up to a few hours at a time to become more independent. Pt has both manual and power WC. Uses power WC at home, uses manual WC out in community. Pt able to self propel manual WC. Pt needs assist putting pants around  ankles but she can pull them up. Pt can put on her shoes but needs help getting her leg up to do so. Pt able to tie her shoelaces on her own. Pt has a tub/shower unit at home with a tub bench. No grab bars in the shower. Has hand held shower head. Pt currently taking a sponge bath. Pt has a regular height toilet with a BSC over top. Pt with h/o L wrist fx with no precautions, pt wore her soft brace to evaluation for comfort but reports her wrist has healed and is doing well following OT.     Limitations  Lifting;Standing;Walking;House hold activities    How long can you sit comfortably?  n/a    How long can you stand comfortably?  3 minutes without UE assist, limited by fatigue    How long can you walk comfortably?  100 ft, limited by fatigue    Currently in Pain?  No/denies            Supine : Single knee to chest in varying arc of diagonals for hip and low back pain relief 30 second holds x 12 each LE Hamstring stretch 60 second holds x 3 trials each LE Popliteal angle stretch 60 second holds x 2 trials each LE LE rocking for low back pain relief 60 seconds  Adductor stretch 60 seconds LLE x3 trials   3000lb weighted ball seated:  -row 15x with cueing for upright posture Reverse crunch 15x with cueing for control of eccentric/concentric hold.  -upright raise with straight arm 15x -arc motion 10x each side  -scap retraction with upright pull 10x    Weighted ball (3000 Gr)  -modified seated posterior lean with forward crunch for full trunk/abdomen activation x10 -arc from one knee to another x10   Isometric hip flexion 10x 3 second holds RTB hip abduction each LE 10x  seated hamstring stretch with LE on PT leg for optimal lengthening 60 seconds   2.5 lb ankle weights -LAQ 10x each LE     Seated rest breaks and use of ice pack utilized to keep body temperature at functional range to reduce MS exacerbation.      Pt educated throughout session about proper posture and technique  with exercises. Improved exercise technique, movement at target joints, use of target muscles after min to mod verbal, visual, tactile cues.     Patient presents with lower back pain and muscle tissue tension that was reduced with combined manual and therex. Patient initially was challenged with transfers to plinth table but improved with prolonged treatment. The patient will continue to benefit from  skilled therapeutic intervention to address deficits in strength, mobility, balance, and function for improved overall QOL and safety                  PT Education - 04/10/19 1225    Education provided  Yes    Education Details  exercise technique, manual    Person(s) Educated  Patient    Methods  Explanation;Demonstration;Tactile cues;Verbal cues    Comprehension  Verbalized understanding;Returned demonstration;Verbal cues required;Tactile cues required       PT Short Term Goals - 03/13/19 1733      PT SHORT TERM GOAL #1   Title  Patient will be compliant with HEP 4-5 days/week for improved carryover between sessions.     Baseline  HEP compliancy between sessions 6/17 compliant    Time  2    Period  Weeks    Status  Achieved    Target Date  10/15/18      PT SHORT TERM GOAL #2   Title  Patient will report no falls/LOB to indicate improved stability with mobility     Baseline  6/17: no falls, one episode of instability 7/20: no falls    Time  2    Period  Weeks    Status  Achieved    Target Date  11/06/18        PT Long Term Goals - 03/13/19 0001      PT LONG TERM GOAL #1   Title  Patient will perform TUG in 40 seconds or less with walker to improve safe negotiation of environment.     Baseline  11/5: 45 seconds 10/9: deferred due to pain 5/26: 61 seconds 6/17: 60 seconds 7/20: 59 seconds 8/26:  55 seconds    Time  8    Period  Weeks    Status  Partially Met    Target Date  05/08/19      PT LONG TERM GOAL #2   Title  Pt's 5mT will improve to at least 0.5 m/s  for improved safety ambulating in home    Baseline  03/13/19: 0.30 m/s 0.14 m/s; 5/15: .290m  7/10: .36 m/s  8/20: .42 m/s  9/26: 0.2649m10/14: .26 m/s improved hip and knee clearance 11/15: 0.37m54mmproved hip and knee clearance; 12/5: .29 m/s improved step length and forward moment: decreased foot drag. 05/09/2018: 0.21 m/s 2/6: .237 m/s 5/26: .23 m/s 6/17: .24 m/s  7/20: 0.26 m/s 8/26: 0.26 m/s 10/8: 0.28 m/s    Time  8    Period  Weeks    Status  Partially Met    Target Date  05/08/19      PT LONG TERM GOAL #3   Title  Pt's Bil hip F strength will improve to at least 2+/5 BLEs for improved gait mechanics and safety    Baseline  11/5: Hips: 2/5 knees 3+/5 10/8: deferred due to pain L: 2/5, R: 1/5 7/10: 2/5 bilaterally  8/20: 2/5 bilat 9/26: 2/5 bilat 10/14: 2/5 bilaterally ,improved hip flexor muscle activation 2/5: improved hip flexor and quad activation: 12/5: 2/5 ; 05/09/2018: 2/5 2/6: 3/5 knee, 2 to 2+/5 hip 5/26: 2-/5 hips 3/5 knees 6/17: 2-/5 hips 3/5 knees    Time  8    Period  Weeks    Status  Partially Met    Target Date  05/08/19      PT LONG TERM GOAL #4   Title  Pt's ABC Scale will improve to at least 40% to  demonstrate improved balance    Baseline  11/5: 32.2% 10/9: 32.1% 28.13% 5/15; 24.4% 7/10: 23.4% 8/20: 22. 2% 9/26: 25.3% 10/14: 26.5% 11/15: 24.38% 12/5: 26%; 05/09/2018: 29% 2/6: 23.75% 526: 30% 6/17: 26.8%  7/2: 28% 8/26: 30.3%    Time  8    Period  Weeks    Status  Partially Met    Target Date  05/08/19      PT LONG TERM GOAL #5   Title  Patient will negotiate one step ~4 inches to enter house independently to increase functional independence in natural environment    Baseline  11/5: able to lift 1.75 inch 10/9: has lifted 1.5 inch in previous sessions /20: unable to do 9/26: patient too fatigued/not feeling well to perform 10/14: unable to perform at this time 11/15: unable to perform at this time; 12/5: able to lift 1 inch; 05/09/2018: unable to clear 4" step, able to lift  foot ~ 1in 5/26: has been unable to perform 6/17: able to lift foot 1.25 inch 8/26: has been able to step up onto 1.5 inch step in previous sessions    Time  8    Period  Weeks    Status  Partially Met    Target Date  05/08/19      PT LONG TERM GOAL #6   Title  Patient will negotiate 30 ft of changing surfaces outside such as pavement and parking lot to allow patient to walk into church.    Baseline  11/5; walks outside to cart at store ~20 ft 10/9: walks 20 ft outside at store now 12/5: does not walk outside 2/24: unable to test due to rain 5/26: unable since COVID 6/17: raining 7/20 too hot to test 8/26: unable to walk outside due to heat and COVID    Time  8    Period  Weeks    Status  Partially Met    Target Date  05/08/19      PT LONG TERM GOAL #7   Title  Patient will stand 5 minutes to increase standing duration to allow patient to return to washing dishes and clothes.     Baseline  11/5: able to perform duration of sponge bath in room ~9 minutes 10/9: deferred due to pain 12/5: 56 seconds; 05/09/2018: 1 min 18 sec 2/6: 2 minutes last session 5/26: 1 minute 36 seconds limited by breathing 6/17: terminated due to abdomen pain  7/20 terminated due to overheating 8/26: able to stand 3 minutes at home    Time  8    Period  Weeks    Status  Achieved      PT LONG TERM GOAL #8   Title  Patient will negotiate tub with mod I to be able to bath/shower in bathroom requiring stability and ability to negotiate small area.     Baseline  11/5: has to sponge bath standing up in room     Time  8    Period  Weeks    Status  New    Target Date  05/08/19            Plan - 04/10/19 1242    Clinical Impression Statement  Patient presents with lower back pain and muscle tissue tension that was reduced with combined manual and therex. Patient initially was challenged with transfers to plinth table but improved with prolonged treatment. The patient will continue to benefit from skilled therapeutic  intervention to address deficits in strength, mobility, balance, and function for  improved overall QOL and safety    Rehab Potential  Good    PT Frequency  2x / week    PT Duration  8 weeks    PT Treatment/Interventions  ADLs/Self Care Home Management;Aquatic Therapy;Biofeedback;Cryotherapy;Electrical Stimulation;Iontophoresis 57m/ml Dexamethasone;Traction;Moist Heat;Ultrasound;DME Instruction;Gait training;Stair training;Functional mobility training;Therapeutic activities;Therapeutic exercise;Balance training;Neuromuscular re-education;Patient/family education;Orthotic Fit/Training;Wheelchair mobility training;Manual techniques;Compression bandaging;Passive range of motion;Dry needling;Energy conservation;Taping;Splinting    PT Next Visit Plan  strength/balance    PT Home Exercise Plan  to initiate next session (please incorporate hip flexor and adductor stretches in HEP if possible)    Consulted and Agree with Plan of Care  Patient       Patient will benefit from skilled therapeutic intervention in order to improve the following deficits and impairments:  Abnormal gait, Decreased activity tolerance, Decreased balance, Decreased coordination, Decreased endurance, Decreased knowledge of use of DME, Decreased mobility, Decreased range of motion, Decreased safety awareness, Decreased strength, Difficulty walking, Impaired perceived functional ability, Impaired sensation, Impaired tone, Impaired UE functional use, Improper body mechanics, Postural dysfunction  Visit Diagnosis: Muscle weakness (generalized)  Other abnormalities of gait and mobility  Unsteadiness on feet     Problem List Patient Active Problem List   Diagnosis Date Noted  . Abscess of female pelvis   . SVT (supraventricular tachycardia) ([place]   . Radial styloid tenosynovitis 03/12/2018  . Wheelchair confinement 02/27/2018  . Localized osteoporosis with current pathological fracture with routine healing 01/19/2017  . Wrist  fracture 01/16/2017  . Sprain of ankle 03/23/2016  . Closed fracture of lateral malleolus 03/16/2016  . Health care maintenance 01/24/2016  . Blood pressure elevated without history of HTN 10/25/2015  . Essential hypertension 10/25/2015  . Multiple sclerosis ([place] 10/02/2015  . Chronic left shoulder pain 07/19/2015  . [place] exacerbation ([place] 07/14/2015  . MS [place] sclerosis) ([place] 11/26/2014  . Increased body mass index 11/26/2014  . HPV test positive 11/26/2014  . Status post laparoscopic supracervical hysterectomy 11/26/2014  . Galactorrhea 11/26/2014  . Back ache 05/21/2014  . Adiposity 05/21/2014  . Disordered sleep 05/21/2014  . Muscle spasticity 05/21/2014  . Spasticity 05/21/2014  . Calculus of kidney 12/09/2013  . Renal colic 035/45/6256 . Hypercholesteremia 08/19/2013  . Hereditary and idiopathic neuropathy 08/19/2013  . Hypercholesterolemia without hypertriglyceridemia 08/19/2013  . Bladder infection, chronic 07/25/2012  . Disorder of bladder function 07/25/2012  . Incomplete bladder emptying 07/25/2012  . Microscopic hematuria 07/25/2012  . Right upper quadrant pain 07/25/2012   MJanna Arch PT, DPT   04/10/2019, 12:43 PM  [facility] [address] [address]Phone: [phone]  Fax:  [phone] Name: JElfreida HeggsRamseur MRN: [id number] of Birth: 602-18-1962

## 2019-04-15 ENCOUNTER — Other Ambulatory Visit: Payer: Self-pay

## 2019-04-15 ENCOUNTER — Ambulatory Visit: Payer: Medicare HMO

## 2019-04-15 DIAGNOSIS — R2689 Other abnormalities of gait and mobility: Secondary | ICD-10-CM

## 2019-04-15 DIAGNOSIS — M6281 Muscle weakness (generalized): Secondary | ICD-10-CM

## 2019-04-15 DIAGNOSIS — R2681 Unsteadiness on feet: Secondary | ICD-10-CM

## 2019-04-15 NOTE — Therapy (Signed)
[place] MAIN [facility] SERVICES [place], [place], [address] Phone: [phone]   Fax:  [phone]  Physical Therapy Treatment  Patient Details  Name: Lisa Williams MRN: [id number] Date of Birth: 08-Sep-1960 Referring Provider (PT): Gurney Maxin, MD   Encounter Date: 04/15/2019  PT End of Session - 04/16/19 0823    Visit Number  [id number]    Number of Visits  143    Date for PT Re-Evaluation  05/08/19    Authorization Type  5/10 PN 03/25/19    PT Start Time  1030    PT Stop Time  1113    PT Time Calculation (min)  43 min    Equipment Utilized During Treatment  Gait belt    Activity Tolerance  Patient tolerated treatment well   heat intolerance   Behavior During Therapy  [facility] for tasks assessed/performed       Past Medical History:  Diagnosis Date  . Abdominal pain, right upper quadrant   . Back pain   . Calculus of kidney 12/09/2013  . Chronic back pain    unspecified  . Chronic left shoulder pain 07/19/2015  . Functional disorder of bladder    other  . Galactorrhea 11/26/2014   Chronic   . Hereditary and idiopathic neuropathy 08/19/2013  . HPV test positive   . Hypercholesteremia 08/19/2013  . Incomplete bladder emptying   . Microscopic hematuria   . MS (multiple sclerosis) ([place])   . Muscle spasticity 05/21/2014  . Nonspecific findings on examination of urine    other  . Osteopenia   . Status post laparoscopic supracervical hysterectomy 11/26/2014  . Tobacco user 11/26/2014  . Wrist fracture     Past Surgical History:  Procedure Laterality Date  . bilateral tubal ligation  1996  . BREAST CYST EXCISION Left 2002  . KNEE SURGERY     right  . LAPAROSCOPIC SUPRACERVICAL HYSTERECTOMY  08/05/2013  . ORIF WRIST FRACTURE Left 01/17/2017   Procedure: OPEN REDUCTION INTERNAL FIXATION (ORIF) WRIST FRACTURE;  Surgeon: Lovell Sheehan, MD;  Location: [facility] ORS;  Service: Orthopedics;  Laterality: Left;  . TUBAL LIGATION  Bilateral   . VAGINAL HYSTERECTOMY  03/2006    There were no vitals filed for this visit.  Subjective Assessment - 04/16/19 0821    Subjective  Patient reports compliance with HEP. No falls or LOB since last session.    Pertinent History  Pt is a 58 y/o F who presents with BLE weakness and difficulty ambulating due to h/o MS that was diagnosed in 1995. Pt denies any pain associated. Pt was admitted to the hospital in November 2018 due to MS exacerbation. Nov-Dec to HHPT-->[facility] until end of Jan. After rehab the pt was able to ambulate up to 100 ft with the RW. Pt has Bil AFO, her therapist at [facility] thinks she needs a new AFO as her feet still drag when she ambulates, pt reports HHPT mentioned a spring AFO. Pt has custom AFOs that were originally made 4 years ago. Goals: improve balance, to be able to get into the shower with her tub bench without assist, to be able to get up 2 steps at sister's home with railings on both sides but too far apart to reach both sides, to be able to drive adapted car. Pt is staying at other sister's house with one step to get inside and currently picking up L leg with UEs and then stepping in. Pt steps down leading with  LLE. Pt wants to work on walking side to side as this is challenging and she had not yet progressed to this at rehab. Pt able to get in/out the car with assist only to bring BLEs into the car. Hoping to drive by June or July with adapted car (already has one). Pt needs assist bringing LEs into and out of hospital bed. Pt able to perform sit>stand without assist from [facility] with a pillow on the seat for a boost. Pt denies any falls in the past 6 months. Pt has been increasing her time alone at home up to a few hours at a time to become more independent. Pt has both manual and power WC. Uses power WC at home, uses manual WC out in community. Pt able to self propel manual WC. Pt needs assist putting pants around ankles but she can pull them up. Pt can put  on her shoes but needs help getting her leg up to do so. Pt able to tie her shoelaces on her own. Pt has a tub/shower unit at home with a tub bench. No grab bars in the shower. Has hand held shower head. Pt currently taking a sponge bath. Pt has a regular height toilet with a BSC over top. Pt with h/o L wrist fx with no precautions, pt wore her soft brace to evaluation for comfort but reports her wrist has healed and is doing well following OT.     Limitations  Lifting;Standing;Walking;House hold activities    How long can you sit comfortably?  n/a    How long can you stand comfortably?  3 minutes without UE assist, limited by fatigue    How long can you walk comfortably?  100 ft, limited by fatigue    Currently in Pain?  No/denies           Standing in // bars:  weight left and right with focus on hip shift rather than trunk shift, BUE support, 12x each side  large step LLE 8x, RLE 10x, more challenging LLE; BUE support, heavy use of UE's  hip flexion into band 10x each LE, fatiguing to patient, BUE support. Increased trunk flexion.  Hip extension into band 10x each LE, more challenging with LLE, BUE support   Seated;   Isometrics: -hip flexion into PT hands 12x 3 second holds each LE, single LE at a time   Cross body jabs with increasing difficulty and range, increasing challenge to L, R, duck for core stability and postural reactions. -Jab L , R duck for cross body coordination and postural control/righting reactions 10x -Jab R, L duck for cross body coordination and postural control/righting reactions 10x -Cross body elbowfor abdominal strengthening and postural challenge x 60 seconds  Seated rest breaks and use of ice pack utilized to keep body temperature at functional range to reduce MS exacerbation.  Pt educated throughout session about proper posture and technique with exercises. Improved exercise technique, movement at target joints, use of target muscles after  min to mod verbal, visual, tactile cues.  Patient presents with excellent motivation throughout physical therapy session. She continues to be challenged with LLE>RLE in regards to coordination and muscle contraction with increased fatigue in limb. The patient will continue to benefit from skilled therapeutic intervention to address deficits in strength, mobility, balance, and function for improved overall QOL and safety   PT Education - 04/16/19 0821    Education provided  Yes    Education Details  exercise technique, body mechanics  Person(s) Educated  Patient    Methods  Explanation;Demonstration;Tactile cues;Verbal cues    Comprehension  Verbalized understanding;Returned demonstration;Verbal cues required;Tactile cues required       PT Short Term Goals - 03/13/19 1733      PT SHORT TERM GOAL #1   Title  Patient will be compliant with HEP 4-5 days/week for improved carryover between sessions.     Baseline  HEP compliancy between sessions 6/17 compliant    Time  2    Period  Weeks    Status  Achieved    Target Date  10/15/18      PT SHORT TERM GOAL #2   Title  Patient will report no falls/LOB to indicate improved stability with mobility     Baseline  6/17: no falls, one episode of instability 7/20: no falls    Time  2    Period  Weeks    Status  Achieved    Target Date  11/06/18        PT Long Term Goals - 03/13/19 0001      PT LONG TERM GOAL #1   Title  Patient will perform TUG in 40 seconds or less with walker to improve safe negotiation of environment.     Baseline  11/5: 45 seconds 10/9: deferred due to pain 5/26: 61 seconds 6/17: 60 seconds 7/20: 59 seconds 8/26:  55 seconds    Time  8    Period  Weeks    Status  Partially Met    Target Date  05/08/19      PT LONG TERM GOAL #2   Title  Pt's 69mT will improve to at least 0.5 m/s for improved safety ambulating in home    Baseline  03/13/19: 0.30 m/s 0.14 m/s; 5/15: .236m  7/10: .36 m/s  8/20: .42 m/s  9/26:  0.2614m10/14: .26 m/s improved hip and knee clearance 11/15: 0.56m58mmproved hip and knee clearance; 12/5: .29 m/s improved step length and forward moment: decreased foot drag. 05/09/2018: 0.21 m/s 2/6: .237 m/s 5/26: .23 m/s 6/17: .24 m/s  7/20: 0.26 m/s 8/26: 0.26 m/s 10/8: 0.28 m/s    Time  8    Period  Weeks    Status  Partially Met    Target Date  05/08/19      PT LONG TERM GOAL #3   Title  Pt's Bil hip F strength will improve to at least 2+/5 BLEs for improved gait mechanics and safety    Baseline  11/5: Hips: 2/5 knees 3+/5 10/8: deferred due to pain L: 2/5, R: 1/5 7/10: 2/5 bilaterally  8/20: 2/5 bilat 9/26: 2/5 bilat 10/14: 2/5 bilaterally ,improved hip flexor muscle activation 2/5: improved hip flexor and quad activation: 12/5: 2/5 ; 05/09/2018: 2/5 2/6: 3/5 knee, 2 to 2+/5 hip 5/26: 2-/5 hips 3/5 knees 6/17: 2-/5 hips 3/5 knees    Time  8    Period  Weeks    Status  Partially Met    Target Date  05/08/19      PT LONG TERM GOAL #4   Title  Pt's ABC Scale will improve to at least 40% to demonstrate improved balance    Baseline  11/5: 32.2% 10/9: 32.1% 28.13% 5/15; 24.4% 7/10: 23.4% 8/20: 22. 2% 9/26: 25.3% 10/14: 26.5% 11/15: 24.38% 12/5: 26%; 05/09/2018: 29% 2/6: 23.75% 526: 30% 6/17: 26.8%  7/2: 28% 8/26: 30.3%    Time  8    Period  Weeks    Status  Partially  Met    Target Date  05/08/19      PT LONG TERM GOAL #5   Title  Patient will negotiate one step ~4 inches to enter house independently to increase functional independence in natural environment    Baseline  11/5: able to lift 1.75 inch 10/9: has lifted 1.5 inch in previous sessions /20: unable to do 9/26: patient too fatigued/not feeling well to perform 10/14: unable to perform at this time 11/15: unable to perform at this time; 12/5: able to lift 1 inch; 05/09/2018: unable to clear 4" step, able to lift foot ~ 1in 5/26: has been unable to perform 6/17: able to lift foot 1.25 inch 8/26: has been able to step up onto 1.5 inch step in  previous sessions    Time  8    Period  Weeks    Status  Partially Met    Target Date  05/08/19      PT LONG TERM GOAL #6   Title  Patient will negotiate 30 ft of changing surfaces outside such as pavement and parking lot to allow patient to walk into church.    Baseline  11/5; walks outside to cart at store ~20 ft 10/9: walks 20 ft outside at store now 12/5: does not walk outside 2/24: unable to test due to rain 5/26: unable since COVID 6/17: raining 7/20 too hot to test 8/26: unable to walk outside due to heat and COVID    Time  8    Period  Weeks    Status  Partially Met    Target Date  05/08/19      PT LONG TERM GOAL #7   Title  Patient will stand 5 minutes to increase standing duration to allow patient to return to washing dishes and clothes.     Baseline  11/5: able to perform duration of sponge bath in room ~9 minutes 10/9: deferred due to pain 12/5: 56 seconds; 05/09/2018: 1 min 18 sec 2/6: 2 minutes last session 5/26: 1 minute 36 seconds limited by breathing 6/17: terminated due to abdomen pain  7/20 terminated due to overheating 8/26: able to stand 3 minutes at home    Time  8    Period  Weeks    Status  Achieved      PT LONG TERM GOAL #8   Title  Patient will negotiate tub with mod I to be able to bath/shower in bathroom requiring stability and ability to negotiate small area.     Baseline  11/5: has to sponge bath standing up in room     Time  8    Period  Weeks    Status  New    Target Date  05/08/19            Plan - 04/16/19 0825    Clinical Impression Statement  Patient presents with excellent motivation throughout physical therapy session. She continues to be challenged with LLE>RLE in regards to coordination and muscle contraction with increased fatigue in limb. The patient will continue to benefit from skilled therapeutic intervention to address deficits in strength, mobility, balance, and function for improved overall QOL and safety    Rehab Potential  Good     PT Frequency  2x / week    PT Duration  8 weeks    PT Treatment/Interventions  ADLs/Self Care Home Management;Aquatic Therapy;Biofeedback;Cryotherapy;Electrical Stimulation;Iontophoresis 65m/ml Dexamethasone;Traction;Moist Heat;Ultrasound;DME Instruction;Gait training;Stair training;Functional mobility training;Therapeutic activities;Therapeutic exercise;Balance training;Neuromuscular re-education;Patient/family education;Orthotic Fit/Training;Wheelchair mobility training;Manual techniques;Compression bandaging;Passive range of motion;Dry needling;Energy  conservation;Taping;Splinting    PT Next Visit Plan  strength/balance    PT Home Exercise Plan  to initiate next session (please incorporate hip flexor and adductor stretches in HEP if possible)    Consulted and Agree with Plan of Care  Patient       Patient will benefit from skilled therapeutic intervention in order to improve the following deficits and impairments:  Abnormal gait, Decreased activity tolerance, Decreased balance, Decreased coordination, Decreased endurance, Decreased knowledge of use of DME, Decreased mobility, Decreased range of motion, Decreased safety awareness, Decreased strength, Difficulty walking, Impaired perceived functional ability, Impaired sensation, Impaired tone, Impaired UE functional use, Improper body mechanics, Postural dysfunction  Visit Diagnosis: Muscle weakness (generalized)  Other abnormalities of gait and mobility  Unsteadiness on feet     Problem List Patient Active Problem List   Diagnosis Date Noted  . Abscess of female pelvis   . SVT (supraventricular tachycardia) ([place])   . Radial styloid tenosynovitis 03/12/2018  . Wheelchair confinement 02/27/2018  . Localized osteoporosis with current pathological fracture with routine healing 01/19/2017  . Wrist fracture 01/16/2017  . Sprain of ankle 03/23/2016  . Closed fracture of lateral malleolus 03/16/2016  . Health care maintenance 01/24/2016  .  Blood pressure elevated without history of HTN 10/25/2015  . Essential hypertension 10/25/2015  . Multiple sclerosis ([place]) 10/02/2015  . Chronic left shoulder pain 07/19/2015  . Multiple sclerosis exacerbation ([place]) 07/14/2015  . MS (multiple sclerosis) ([place]) 11/26/2014  . Increased body mass index 11/26/2014  . HPV test positive 11/26/2014  . Status post laparoscopic supracervical hysterectomy 11/26/2014  . Galactorrhea 11/26/2014  . Back ache 05/21/2014  . Adiposity 05/21/2014  . Disordered sleep 05/21/2014  . Muscle spasticity 05/21/2014  . Spasticity 05/21/2014  . Calculus of kidney 12/09/2013  . Renal colic 35/32/9924  . Hypercholesteremia 08/19/2013  . Hereditary and idiopathic neuropathy 08/19/2013  . Hypercholesterolemia without hypertriglyceridemia 08/19/2013  . Bladder infection, chronic 07/25/2012  . Disorder of bladder function 07/25/2012  . Incomplete bladder emptying 07/25/2012  . Microscopic hematuria 07/25/2012  . Right upper quadrant pain 07/25/2012   Janna Arch, PT, DPT   04/16/2019, 8:26 AM  [place] MAIN [facility] SERVICES [address] [place], [place], [address] Phone: [phone]   Fax:  [phone]  Name: Lisa Williams MRN: [id number] Date of Birth: Sep 14, 1960

## 2019-04-17 ENCOUNTER — Other Ambulatory Visit: Payer: Self-pay

## 2019-04-17 ENCOUNTER — Ambulatory Visit: Payer: Medicare HMO

## 2019-04-17 DIAGNOSIS — M6281 Muscle weakness (generalized): Secondary | ICD-10-CM | POA: Diagnosis not present

## 2019-04-17 DIAGNOSIS — R2689 Other abnormalities of gait and mobility: Secondary | ICD-10-CM

## 2019-04-17 DIAGNOSIS — R2681 Unsteadiness on feet: Secondary | ICD-10-CM

## 2019-04-17 NOTE — Therapy (Signed)
[facility] [address] [place], [place], [address] Phone: [phone]   Fax:  [phone]  Physical Therapy Treatment  Patient Details  Name: Lisa Williams MRN: [id number] Date of Birth: 10/24/1960 Referring Provider (PT): Zachary Potter, MD   Encounter Date: 04/17/2019  PT End of Session - 04/17/19 1652    Visit Number  [id number]    Number of Visits  143    Date for PT Re-Evaluation  05/08/19    Authorization Type  6/10 PN 03/25/19    PT Start Time  1030    PT Stop Time  1112    PT Time Calculation (min)  42 min    Equipment Utilized During Treatment  Gait belt    Activity Tolerance  Patient tolerated treatment well   heat intolerance   Behavior During Therapy  WFL for tasks assessed/performed       Past Medical History:  Diagnosis Date  . Abdominal pain, right upper quadrant   . Back pain   . Calculus of kidney 12/09/2013  . Chronic back pain    unspecified  . Chronic left shoulder pain 07/19/2015  . Functional disorder of bladder    other  . Galactorrhea 11/26/2014   Chronic   . Hereditary and idiopathic neuropathy 08/19/2013  . HPV test positive   . Hypercholesteremia 08/19/2013  . Incomplete bladder emptying   . Microscopic hematuria   . MS (multiple sclerosis) (HCC)   . Muscle spasticity 05/21/2014  . Nonspecific findings on examination of urine    other  . Osteopenia   . Status post laparoscopic supracervical hysterectomy 11/26/2014  . Tobacco user 11/26/2014  . Wrist fracture     Past Surgical History:  Procedure Laterality Date  . bilateral tubal ligation  1996  . BREAST CYST EXCISION Left 2002  . KNEE SURGERY     right  . LAPAROSCOPIC SUPRACERVICAL HYSTERECTOMY  08/05/2013  . ORIF WRIST FRACTURE Left 01/17/2017   Procedure: OPEN REDUCTION INTERNAL FIXATION (ORIF) WRIST FRACTURE;  Surgeon: Bowers, James R, MD;  Location: [facility] ORS;  Service: Orthopedics;  Laterality: Left;  . TUBAL LIGATION  Bilateral   . VAGINAL HYSTERECTOMY  03/2006    There were no vitals filed for this visit.  Subjective Assessment - 04/17/19 1651    Subjective  Patient reports no falls or LOB since last session. Has been compliant with her HEP, has noticed an improvement with her walking at home.    Pertinent History  Pt is a 58 y/o F who presents with BLE weakness and difficulty ambulating due to h/o MS that was diagnosed in 1995. Pt denies any pain associated. Pt was admitted to the hospital in November 2018 due to MS exacerbation. Nov-Dec to [facility] until end of Jan. After rehab the pt was able to ambulate up to 100 ft with the RW. Pt has Bil AFO, her therapist at [facility] thinks she needs a new AFO as her feet still drag when she ambulates, pt reports HHPT mentioned a spring AFO. Pt has custom AFOs that were originally made 4 years ago. Goals: improve balance, to be able to get into the shower with her tub bench without assist, to be able to get up 2 steps at sister's home with railings on both sides but too far apart to reach both sides, to be able to drive adapted car. Pt is staying at other sister's house with one step to get inside and currently picking up L   leg with UEs and then stepping in. Pt steps down leading with LLE. Pt wants to work on walking side to side as this is challenging and she had not yet progressed to this at rehab. Pt able to get in/out the car with assist only to bring BLEs into the car. Hoping to drive by June or July with adapted car (already has one). Pt needs assist bringing LEs into and out of hospital bed. Pt able to perform sit>stand without assist from WC with a pillow on the seat for a boost. Pt denies any falls in the past 6 months. Pt has been increasing her time alone at home up to a few hours at a time to become more independent. Pt has both manual and power WC. Uses power WC at home, uses manual WC out in community. Pt able to self propel manual WC. Pt needs assist  putting pants around ankles but she can pull them up. Pt can put on her shoes but needs help getting her leg up to do so. Pt able to tie her shoelaces on her own. Pt has a tub/shower unit at home with a tub bench. No grab bars in the shower. Has hand held shower head. Pt currently taking a sponge bath. Pt has a regular height toilet with a BSC over top. Pt with h/o L wrist fx with no precautions, pt wore her soft brace to evaluation for comfort but reports her wrist has healed and is doing well following OT.     Limitations  Lifting;Standing;Walking;House hold activities    How long can you sit comfortably?  n/a    How long can you stand comfortably?  3 minutes without UE assist, limited by fatigue    How long can you walk comfortably?  100 ft, limited by fatigue    Currently in Pain?  No/denies             Treatment:  Standing weight shift with RW and CGA 60 seconds, good shift with hips with no LOB  Ambulate 52 ft with RW and CGA, cueing for weight shift and hip flexion for improved foot clearance. Slight foot drag last 5 ft due to fatigue. Improved step length and swing phase noted with ambulation.   Standing with RW: yellow ribbon tied in front of walker as a visual cue: hip /knee flexion to attempt to touch ribbon for single limb muscle activation/sequencing for pre-gait stability; 10x each LE, almost able to touch ribbon with LLE, challenging with RLE  Standing with RW: knee abduction/adduction control for focus on widening BOS for increased stability x 60 seconds  Seated: Weighted ball (3000 gr) -forward raise then upward raise 10x -lateral side to side raise/arc 10x each side -scap retraction with lateral pull 10x each diagonal  Lateral reaches side to side for lateral trunk stability/muscle activation. 10x each side; very fatiguing to patient  Seated hip flexion isometric with arms crossed and upright posture 10x each side.     Seated rest breaks and use of ice pack  utilized to keep body temperature at functional range to reduce MS exacerbation.  Pt educated throughout session about proper posture and technique with exercises. Improved exercise technique, movement at target joints, use of target muscles after min to mod verbal, visual, tactile cues.   Patient continues to progress with functional ambulation mechanics with improved step length and swing phase. Decreased episodes of "scuffing" of foot noted with ambulation, only appearing once patient fatigued. Patient continues to demonstrate excellent   motivation throughout session. The patient will continue to benefit from skilled therapeutic intervention to address deficits in strength, mobility, balance, and function for improved overall QOL and safety              PT Education - 04/17/19 1652    Education provided  Yes    Education Details  exercise technique, body mechanics    Person(s) Educated  Patient    Methods  Explanation;Demonstration;Tactile cues;Verbal cues    Comprehension  Verbalized understanding;Returned demonstration;Verbal cues required;Tactile cues required       PT Short Term Goals - 03/13/19 1733      PT SHORT TERM GOAL #1   Title  Patient will be compliant with HEP 4-5 days/week for improved carryover between sessions.     Baseline  HEP compliancy between sessions 6/17 compliant    Time  2    Period  Weeks    Status  Achieved    Target Date  10/15/18      PT SHORT TERM GOAL #2   Title  Patient will report no falls/LOB to indicate improved stability with mobility     Baseline  6/17: no falls, one episode of instability 7/20: no falls    Time  2    Period  Weeks    Status  Achieved    Target Date  11/06/18        PT Long Term Goals - 03/13/19 0001      PT LONG TERM GOAL #1   Title  Patient will perform TUG in 40 seconds or less with walker to improve safe negotiation of environment.     Baseline  11/5: 45 seconds 10/9: deferred due to pain 5/26: 61  seconds 6/17: 60 seconds 7/20: 59 seconds 8/26:  55 seconds    Time  8    Period  Weeks    Status  Partially Met    Target Date  05/08/19      PT LONG TERM GOAL #2   Title  Pt's 10mWT will improve to at least 0.5 m/s for improved safety ambulating in home    Baseline  03/13/19: 0.30 m/s 0.14 m/s; 5/15: .26m/s  7/10: .36 m/s  8/20: .42 m/s  9/26: 0.26m/s 10/14: .26 m/s improved hip and knee clearance 11/15: 0.28m/s improved hip and knee clearance; 12/5: .29 m/s improved step length and forward moment: decreased foot drag. 05/09/2018: 0.21 m/s 2/6: .237 m/s 5/26: .23 m/s 6/17: .24 m/s  7/20: 0.26 m/s 8/26: 0.26 m/s 10/8: 0.28 m/s    Time  8    Period  Weeks    Status  Partially Met    Target Date  05/08/19      PT LONG TERM GOAL #3   Title  Pt's Bil hip F strength will improve to at least 2+/5 BLEs for improved gait mechanics and safety    Baseline  11/5: Hips: 2/5 knees 3+/5 10/8: deferred due to pain L: 2/5, R: 1/5 7/10: 2/5 bilaterally  8/20: 2/5 bilat 9/26: 2/5 bilat 10/14: 2/5 bilaterally ,improved hip flexor muscle activation 2/5: improved hip flexor and quad activation: 12/5: 2/5 ; 05/09/2018: 2/5 2/6: 3/5 knee, 2 to 2+/5 hip 5/26: 2-/5 hips 3/5 knees 6/17: 2-/5 hips 3/5 knees    Time  8    Period  Weeks    Status  Partially Met    Target Date  05/08/19      PT LONG TERM GOAL #4   Title  Pt's ABC Scale   will improve to at least 40% to demonstrate improved balance    Baseline  11/5: 32.2% 10/9: 32.1% 28.13% 5/15; 24.4% 7/10: 23.4% 8/20: 22. 2% 9/26: 25.3% 10/14: 26.5% 11/15: 24.38% 12/5: 26%; 05/09/2018: 29% 2/6: 23.75% 526: 30% 6/17: 26.8%  7/2: 28% 8/26: 30.3%    Time  8    Period  Weeks    Status  Partially Met    Target Date  05/08/19      PT LONG TERM GOAL #5   Title  Patient will negotiate one step ~4 inches to enter house independently to increase functional independence in natural environment    Baseline  11/5: able to lift 1.75 inch 10/9: has lifted 1.5 inch in previous sessions  /20: unable to do 9/26: patient too fatigued/not feeling well to perform 10/14: unable to perform at this time 11/15: unable to perform at this time; 12/5: able to lift 1 inch; 05/09/2018: unable to clear 4" step, able to lift foot ~ 1in 5/26: has been unable to perform 6/17: able to lift foot 1.25 inch 8/26: has been able to step up onto 1.5 inch step in previous sessions    Time  8    Period  Weeks    Status  Partially Met    Target Date  05/08/19      PT LONG TERM GOAL #6   Title  Patient will negotiate 30 ft of changing surfaces outside such as pavement and parking lot to allow patient to walk into church.    Baseline  11/5; walks outside to cart at store ~20 ft 10/9: walks 20 ft outside at store now 12/5: does not walk outside 2/24: unable to test due to rain 5/26: unable since COVID 6/17: raining 7/20 too hot to test 8/26: unable to walk outside due to heat and COVID    Time  8    Period  Weeks    Status  Partially Met    Target Date  05/08/19      PT LONG TERM GOAL #7   Title  Patient will stand 5 minutes to increase standing duration to allow patient to return to washing dishes and clothes.     Baseline  11/5: able to perform duration of sponge bath in room ~9 minutes 10/9: deferred due to pain 12/5: 56 seconds; 05/09/2018: 1 min 18 sec 2/6: 2 minutes last session 5/26: 1 minute 36 seconds limited by breathing 6/17: terminated due to abdomen pain  7/20 terminated due to overheating 8/26: able to stand 3 minutes at home    Time  8    Period  Weeks    Status  Achieved      PT LONG TERM GOAL #8   Title  Patient will negotiate tub with mod I to be able to bath/shower in bathroom requiring stability and ability to negotiate small area.     Baseline  11/5: has to sponge bath standing up in room     Time  8    Period  Weeks    Status  New    Target Date  05/08/19            Plan - 04/17/19 1654    Clinical Impression Statement  Patient continues to progress with functional  ambulation mechanics with improved step length and swing phase. Decreased episodes of "scuffing" of foot noted with ambulation, only appearing once patient fatigued. Patient continues to demonstrate excellent motivation throughout session. The patient will continue to benefit from skilled   therapeutic intervention to address deficits in strength, mobility, balance, and function for improved overall QOL and safety    Rehab Potential  Good    PT Frequency  2x / week    PT Duration  8 weeks    PT Treatment/Interventions  ADLs/Self Care Home Management;Aquatic Therapy;Biofeedback;Cryotherapy;Electrical Stimulation;Iontophoresis 4mg/ml Dexamethasone;Traction;Moist Heat;Ultrasound;DME Instruction;Gait training;Stair training;Functional mobility training;Therapeutic activities;Therapeutic exercise;Balance training;Neuromuscular re-education;Patient/family education;Orthotic Fit/Training;Wheelchair mobility training;Manual techniques;Compression bandaging;Passive range of motion;Dry needling;Energy conservation;Taping;Splinting    PT Next Visit Plan  strength/balance    PT Home Exercise Plan  to initiate next session (please incorporate hip flexor and adductor stretches in HEP if possible)    Consulted and Agree with Plan of Care  Patient       Patient will benefit from skilled therapeutic intervention in order to improve the following deficits and impairments:  Abnormal gait, Decreased activity tolerance, Decreased balance, Decreased coordination, Decreased endurance, Decreased knowledge of use of DME, Decreased mobility, Decreased range of motion, Decreased safety awareness, Decreased strength, Difficulty walking, Impaired perceived functional ability, Impaired sensation, Impaired tone, Impaired UE functional use, Improper body mechanics, Postural dysfunction  Visit Diagnosis: Muscle weakness (generalized)  Other abnormalities of gait and mobility  Unsteadiness on feet     Problem List Patient  Active Problem List   Diagnosis Date Noted  . Abscess of female pelvis   . SVT (supraventricular tachycardia) (HCC)   . Radial styloid tenosynovitis 03/12/2018  . Wheelchair confinement 02/27/2018  . Localized osteoporosis with current pathological fracture with routine healing 01/19/2017  . Wrist fracture 01/16/2017  . Sprain of ankle 03/23/2016  . Closed fracture of lateral malleolus 03/16/2016  . Health care maintenance 01/24/2016  . Blood pressure elevated without history of HTN 10/25/2015  . Essential hypertension 10/25/2015  . Multiple sclerosis (HCC) 10/02/2015  . Chronic left shoulder pain 07/19/2015  . Multiple sclerosis exacerbation (HCC) 07/14/2015  . MS (multiple sclerosis) (HCC) 11/26/2014  . Increased body mass index 11/26/2014  . HPV test positive 11/26/2014  . Status post laparoscopic supracervical hysterectomy 11/26/2014  . Galactorrhea 11/26/2014  . Back ache 05/21/2014  . Adiposity 05/21/2014  . Disordered sleep 05/21/2014  . Muscle spasticity 05/21/2014  . Spasticity 05/21/2014  . Calculus of kidney 12/09/2013  . Renal colic 12/09/2013  . Hypercholesteremia 08/19/2013  . Hereditary and idiopathic neuropathy 08/19/2013  . Hypercholesterolemia without hypertriglyceridemia 08/19/2013  . Bladder infection, chronic 07/25/2012  . Disorder of bladder function 07/25/2012  . Incomplete bladder emptying 07/25/2012  . Microscopic hematuria 07/25/2012  . Right upper quadrant pain 07/25/2012     , PT, DPT   04/17/2019, 4:55 PM  [facility] [address] [place], [place], [address] Phone: [phone]   Fax:  [phone]  Name: Lisa Williams MRN: [id number] Date of Birth: 09/12/1960   

## 2019-04-21 ENCOUNTER — Other Ambulatory Visit: Payer: Self-pay

## 2019-04-21 ENCOUNTER — Encounter: Payer: Self-pay | Admitting: Emergency Medicine

## 2019-04-21 ENCOUNTER — Emergency Department
Admission: EM | Admit: 2019-04-21 | Discharge: 2019-04-21 | Disposition: A | Discharge status: Home / Self Care | Payer: Medicare HMO | Attending: Emergency Medicine | Admitting: Emergency Medicine

## 2019-04-21 DIAGNOSIS — Z79899 Other long term (current) drug therapy: Secondary | ICD-10-CM | POA: Insufficient documentation

## 2019-04-21 DIAGNOSIS — R110 Nausea: Secondary | ICD-10-CM | POA: Insufficient documentation

## 2019-04-21 DIAGNOSIS — R42 Dizziness and giddiness: Secondary | ICD-10-CM | POA: Diagnosis present

## 2019-04-21 DIAGNOSIS — Z87891 Personal history of nicotine dependence: Secondary | ICD-10-CM | POA: Diagnosis not present

## 2019-04-21 LAB — CBC
HCT: 37.3 % (ref 36.0–46.0)
Hemoglobin: 13.5 g/dL (ref 12.0–15.0)
MCH: 26 pg (ref 26.0–34.0)
MCHC: 36.2 g/dL — ABNORMAL HIGH (ref 30.0–36.0)
MCV: 71.7 fL — ABNORMAL LOW (ref 80.0–100.0)
Platelets: 266 10*3/uL (ref 150–400)
RBC: 5.2 MIL/uL — ABNORMAL HIGH (ref 3.87–5.11)
RDW: 14.6 % (ref 11.5–15.5)
WBC: 7.6 10*3/uL (ref 4.0–10.5)
nRBC: 0 % (ref 0.0–0.2)

## 2019-04-21 LAB — DIFFERENTIAL
Abs Immature Granulocytes: 0.03 10*3/uL (ref 0.00–0.07)
Basophils Absolute: 0.1 10*3/uL (ref 0.0–0.1)
Basophils Relative: 1 %
Eosinophils Absolute: 0.1 10*3/uL (ref 0.0–0.5)
Eosinophils Relative: 2 %
Immature Granulocytes: 0 %
Lymphocytes Relative: 24 %
Lymphs Abs: 1.8 10*3/uL (ref 0.7–4.0)
Monocytes Absolute: 0.6 10*3/uL (ref 0.1–1.0)
Monocytes Relative: 8 %
Neutro Abs: 5 10*3/uL (ref 1.7–7.7)
Neutrophils Relative %: 65 %

## 2019-04-21 LAB — COMPREHENSIVE METABOLIC PANEL
ALT: 17 U/L (ref 0–44)
AST: 19 U/L (ref 15–41)
Albumin: 4 g/dL (ref 3.5–5.0)
Alkaline Phosphatase: 98 U/L (ref 38–126)
Anion gap: 10 (ref 5–15)
BUN: 25 mg/dL — ABNORMAL HIGH (ref 6–20)
CO2: 23 mmol/L (ref 22–32)
Calcium: 8.8 mg/dL — ABNORMAL LOW (ref 8.9–10.3)
Chloride: 108 mmol/L (ref 98–111)
Creatinine, Ser: 0.54 mg/dL (ref 0.44–1.00)
GFR calc Af Amer: >60 mL/min (ref >60)
GFR calc non Af Amer: >60 mL/min (ref >60)
Glucose, Bld: 104 mg/dL — ABNORMAL HIGH (ref 70–99)
Potassium: 3.6 mmol/L (ref 3.5–5.1)
Sodium: 141 mmol/L (ref 135–145)
Total Bilirubin: 0.9 mg/dL (ref 0.3–1.2)
Total Protein: 7.6 g/dL (ref 6.5–8.1)

## 2019-04-21 MED ORDER — SODIUM CHLORIDE 0.9 % IV SOLN
1000.0000 mL | Freq: Once | INTRAVENOUS | Status: AC
  Administered 2019-04-21: 12:00:00 1000 mL via INTRAVENOUS

## 2019-04-21 MED ORDER — ONDANSETRON 4 MG PO TBDP: 4.0000 mg | ORAL_TABLET | Freq: Three times a day (TID) | ORAL | 0 refills | Status: DC | PRN

## 2019-04-21 MED ORDER — ONDANSETRON HCL 4 MG/2ML IJ SOLN
4.0000 mg | Freq: Once | INTRAMUSCULAR | Status: AC
  Administered 2019-04-21: 12:00:00 4 mg via INTRAVENOUS
  Filled 2019-04-21: qty 2

## 2019-04-21 NOTE — ED Provider Notes (Signed)
[facility] Emergency Department Provider Note   ____________________________________________    I have reviewed the triage vital signs and the nursing notes.   HISTORY  Chief Complaint Nausea and Dizziness     HPI Lisa Williams is a 58 y.o. female with a history of MS who presents with complaints of dizziness and nausea that started this morning after she sat up and turned her head.  She reports that the room seemed like he was spinning at that time.  Since then she no longer has vertiginous symptoms or feels somewhat lightheaded and nauseated.  She feels like if she could have a big burp he would feel much better.  Denies abdominal pain otherwise.  Has not take anything for this.  No neuro deficits.  No headache.  Past Medical History:  Diagnosis Date  . Abdominal pain, right upper quadrant   . Back pain   . Calculus of kidney 12/09/2013  . Chronic back pain    unspecified  . Chronic left shoulder pain 07/19/2015  . Functional disorder of bladder    other  . Galactorrhea 11/26/2014   Chronic   . Hereditary and idiopathic neuropathy 08/19/2013  . HPV test positive   . Hypercholesteremia 08/19/2013  . Incomplete bladder emptying   . Microscopic hematuria   . MS (multiple sclerosis) (HCC)   . Muscle spasticity 05/21/2014  . Nonspecific findings on examination of urine    other  . Osteopenia   . Status post laparoscopic supracervical hysterectomy 11/26/2014  . Tobacco user 11/26/2014  . Wrist fracture     Patient Active Problem List   Diagnosis Date Noted  . Abscess of female pelvis   . SVT (supraventricular tachycardia) (HCC)   . Radial styloid tenosynovitis 03/12/2018  . Wheelchair confinement 02/27/2018  . Localized osteoporosis with current pathological fracture with routine healing 01/19/2017  . Wrist fracture 01/16/2017  . Sprain of ankle 03/23/2016  . Closed fracture of lateral malleolus 03/16/2016  . Health care maintenance  01/24/2016  . Blood pressure elevated without history of HTN 10/25/2015  . Essential hypertension 10/25/2015  . Multiple sclerosis (HCC) 10/02/2015  . Chronic left shoulder pain 07/19/2015  . Multiple sclerosis exacerbation (HCC) 07/14/2015  . MS (multiple sclerosis) (HCC) 11/26/2014  . Increased body mass index 11/26/2014  . HPV test positive 11/26/2014  . Status post laparoscopic supracervical hysterectomy 11/26/2014  . Galactorrhea 11/26/2014  . Back ache 05/21/2014  . Adiposity 05/21/2014  . Disordered sleep 05/21/2014  . Muscle spasticity 05/21/2014  . Spasticity 05/21/2014  . Calculus of kidney 12/09/2013  . Renal colic 12/09/2013  . Hypercholesteremia 08/19/2013  . Hereditary and idiopathic neuropathy 08/19/2013  . Hypercholesterolemia without hypertriglyceridemia 08/19/2013  . Bladder infection, chronic 07/25/2012  . Disorder of bladder function 07/25/2012  . Incomplete bladder emptying 07/25/2012  . Microscopic hematuria 07/25/2012  . Right upper quadrant pain 07/25/2012    Past Surgical History:  Procedure Laterality Date  . bilateral tubal ligation  1996  . BREAST CYST EXCISION Left 2002  . KNEE SURGERY     right  . LAPAROSCOPIC SUPRACERVICAL HYSTERECTOMY  08/05/2013  . ORIF WRIST FRACTURE Left 01/17/2017   Procedure: OPEN REDUCTION INTERNAL FIXATION (ORIF) WRIST FRACTURE;  Surgeon: Lyndle Herrlich, MD;  Location: [facility] ORS;  Service: Orthopedics;  Laterality: Left;  . TUBAL LIGATION Bilateral   . VAGINAL HYSTERECTOMY  03/2006    Prior to Admission medications   Medication Sig Start Date End Date Taking? Authorizing Provider  baclofen (LIORESAL) 20 MG tablet Take 20 mg by mouth 3 (three) times daily.     [provider]  Cholecalciferol (EQL VITAMIN D3) 25 MCG (1000 UT) tablet Take 2,000 Units by mouth daily.    [provider]  gabapentin (NEURONTIN) 100 MG capsule  01/03/19   [provider]  MULTIPLE VITAMIN PO Take 1 tablet by mouth  daily.    [provider]  ondansetron (ZOFRAN ODT) 4 MG disintegrating tablet Take 1 tablet (4 mg total) by mouth every 8 (eight) hours as needed. 04/21/19   Jene Every, MD  oxybutynin (DITROPAN-XL) 10 MG 24 hr tablet Take 1 tablet (10 mg total) by mouth daily. 03/12/19   Vanna Scotland, MD  telmisartan (MICARDIS) 40 MG tablet Take 40 mg by mouth daily. 10/05/17   [provider]     Allergies Amlodipine and Povidone iodine  Family History  Problem Relation Age of Onset  . Sickle cell trait Sister   . Hypertension Sister   . Supraventricular tachycardia Sister   . Hypertension Sister   . Diabetes Maternal Grandmother   . Liver cancer Maternal Grandmother   . Diabetes Maternal Aunt   . Breast cancer Maternal Aunt        great MAT  . Ovarian cancer Paternal Grandmother   . Nephrolithiasis Father   . Hypertension Father   . Prostate cancer Father   . Kidney disease Father   . Heart failure Father   . Hypertension Sister   . Osteoarthritis Mother   . Hypertension Mother   . Hyperlipidemia Mother   . GU problems Neg Hx   . Urolithiasis Neg Hx     Social History Social History   Tobacco Use  . Smoking status: Former Smoker    Packs/day: 0.50    Types: Cigarettes  . Smokeless tobacco: Never Used  Substance Use Topics  . Alcohol use: No    Alcohol/week: 0.0 standard drinks  . Drug use: No    Review of Systems  Constitutional: No fever/chills Eyes: As above ENT: No sore throat. Cardiovascular: No palpitations Respiratory: Denies shortness of breath. Gastrointestinal: As above Genitourinary: Negative for dysuria. Musculoskeletal: Negative for back pain. Skin: Negative for rash. Neurological: Negative for headaches or weakness   ____________________________________________   PHYSICAL EXAM:  VITAL SIGNS: ED Triage Vitals  Enc Vitals Group     BP 04/21/19 0938 138/89     Pulse Rate 04/21/19 0938 75     Resp 04/21/19 0938 20     Temp  04/21/19 0938 98.3 F (36.8 C)     Temp Source 04/21/19 0938 Oral     SpO2 04/21/19 0938 99 %     Weight 04/21/19 0939 120.2 kg (265 lb)     Height 04/21/19 0939 1.803 m (5\' 11" )     Head Circumference --      Peak Flow --      Pain Score 04/21/19 0939 0     Pain Loc --      Pain Edu? --      Excl. in GC? --     Constitutional: Alert and oriented. No acute distress. Pleasant and interactive Eyes: Conjunctivae are normal.  No nystagmus  Nose: No congestion/rhinnorhea. Mouth/Throat: Mucous membranes are moist.    Cardiovascular: Normal rate, regular rhythm.   Good peripheral circulation. Respiratory: Normal respiratory effort.  No retractions. Gastrointestinal: Soft and nontender. No distention.    Musculoskeletal: No lower extremity tenderness nor edema.  Warm and well perfused  Neurologic:  Normal speech and language. No gross focal neurologic deficits are appreciated.  Skin:  Skin is warm, dry and intact. No rash noted. Psychiatric: Mood and affect are normal. Speech and behavior are normal.  ____________________________________________   LABS (all labs ordered are listed, but only abnormal results are displayed)  Labs Reviewed  CBC - Abnormal; Notable for the following components:      Result Value   RBC 5.20 (*)    MCV 71.7 (*)    MCHC 36.2 (*)    All other components within normal limits  COMPREHENSIVE METABOLIC PANEL - Abnormal; Notable for the following components:   Glucose, Bld 104 (*)    BUN 25 (*)    Calcium 8.8 (*)    All other components within normal limits  DIFFERENTIAL   ____________________________________________  EKG  ED ECG REPORT I, Lavonia Drafts, the attending physician, personally viewed and interpreted this ECG.  Date: 04/21/2019  Rhythm: normal sinus rhythm QRS Axis: normal Intervals: normal ST/T Wave abnormalities: normal Narrative Interpretation: no evidence of acute  ischemia  ____________________________________________  RADIOLOGY  Patient refused imaging ____________________________________________   PROCEDURES  Procedure(s) performed: No  Procedures   Critical Care performed: No ____________________________________________   INITIAL IMPRESSION / ASSESSMENT AND PLAN / ED COURSE  Pertinent labs & imaging results that were available during my care of the patient were reviewed by me and considered in my medical decision making (see chart for details).  Patient well-appearing in no acute distress.  Mild nausea with lightheadedness however overall exam is reassuring.  No chest pain shortness of breath.  No continued vertigo symptoms, reports symptoms have improved on her own.  Will treat with IV Zofran, IV fluids and reevaluate.  Lab work is overall reassuring  After fluids and Zofran patient reports symptoms improved greatly.  Given reassuring work-up here and improved symptoms appropriate for discharge at this time with outpatient follow-up.  Patient agrees with this plan.    ____________________________________________   FINAL CLINICAL IMPRESSION(S) / ED DIAGNOSES  Final diagnoses:  Nausea  Dizziness        Note:  This document was prepared using Dragon voice recognition software and may include unintentional dictation errors.   Lavonia Drafts, MD 04/21/19 [phone]

## 2019-04-21 NOTE — ED Notes (Signed)
See triage note  Presents with dizziness   States she noticed dizziness when she got up this am  Presented via EMS   Also has has some nausea  stateas sx's ease off when she lies flat  Hx of MS

## 2019-04-21 NOTE — ED Triage Notes (Signed)
Pt reports when she woke up she was lightheaded and dizzy and nauseated and that is not normal for her. Denies pain or SOB.

## 2019-04-21 NOTE — ED Triage Notes (Signed)
Pt refusing to get a CT scan, states she has had one before and does not need another.

## 2019-04-21 NOTE — ED Triage Notes (Signed)
In via EMS from home with c/o dizziness when she stood up this am. Per EMS pt reports she got nauseated as well but when she laid back down flat the symptoms went away. Pt with hx of MS but is able to get around. BP 201/96, pt with hx of HTN and has not taken meds this am.

## 2019-04-22 ENCOUNTER — Ambulatory Visit: Payer: Medicare HMO

## 2019-04-24 ENCOUNTER — Ambulatory Visit: Payer: Medicare HMO

## 2019-04-24 ENCOUNTER — Other Ambulatory Visit: Payer: Self-pay

## 2019-04-24 DIAGNOSIS — M6281 Muscle weakness (generalized): Secondary | ICD-10-CM

## 2019-04-24 DIAGNOSIS — R2681 Unsteadiness on feet: Secondary | ICD-10-CM

## 2019-04-24 DIAGNOSIS — R2689 Other abnormalities of gait and mobility: Secondary | ICD-10-CM

## 2019-04-24 NOTE — Therapy (Signed)
[place] [facility] [address] [place], [place], [address] Phone: [phone]   Fax:  [phone]  Physical Therapy Treatment  Patient Details  Name: Lisa Williams MRN: [id number] Date of Birth: 1960/11/12 Referring Provider (PT): Gurney Maxin, MD   Encounter Date: 04/24/2019  PT End of Session - 04/24/19 1603    Visit Number  137    Number of Visits  143    Date for PT Re-Evaluation  05/08/19    Authorization Type  7/10 PN 03/25/19    PT Start Time  1030    PT Stop Time  1111    PT Time Calculation (min)  41 min    Equipment Utilized During Treatment  Gait belt    Activity Tolerance  Patient tolerated treatment well   heat intolerance   Behavior During Therapy  [facility] for tasks assessed/performed       Past Medical History:  Diagnosis Date  . Abdominal pain, right upper quadrant   . Back pain   . Calculus of kidney 12/09/2013  . Chronic back pain    unspecified  . Chronic left shoulder pain 07/19/2015  . Functional disorder of bladder    other  . Galactorrhea 11/26/2014   Chronic   . Hereditary and idiopathic neuropathy 08/19/2013  . HPV test positive   . Hypercholesteremia 08/19/2013  . Incomplete bladder emptying   . Microscopic hematuria   . MS (multiple sclerosis) ([place])   . Muscle spasticity 05/21/2014  . Nonspecific findings on examination of urine    other  . Osteopenia   . Status post laparoscopic supracervical hysterectomy 11/26/2014  . Tobacco user 11/26/2014  . Wrist fracture     Past Surgical History:  Procedure Laterality Date  . bilateral tubal ligation  1996  . BREAST CYST EXCISION Left 2002  . KNEE SURGERY     right  . LAPAROSCOPIC SUPRACERVICAL HYSTERECTOMY  08/05/2013  . ORIF WRIST FRACTURE Left 01/17/2017   Procedure: OPEN REDUCTION INTERNAL FIXATION (ORIF) WRIST FRACTURE;  Surgeon: Lovell Sheehan, MD;  Location: [facility] ORS;  Service: Orthopedics;  Laterality: Left;  . TUBAL LIGATION  Bilateral   . VAGINAL HYSTERECTOMY  03/2006    There were no vitals filed for this visit.  Subjective Assessment - 04/24/19 1601    Subjective  Patient went to the ED since last session due to dizziness and dehydration. Did not have to be hospitalized thankfully but is still tired. No falls or LOB since last session.    Pertinent History  Pt is a 58 y/o F who presents with BLE weakness and difficulty ambulating due to h/o MS that was diagnosed in 1995. Pt denies any pain associated. Pt was admitted to the hospital in November 2018 due to MS exacerbation. Nov-Dec to HHPT-->[facility] until end of Jan. After rehab the pt was able to ambulate up to 100 ft with the RW. Pt has Bil AFO, her therapist at [facility] thinks she needs a new AFO as her feet still drag when she ambulates, pt reports HHPT mentioned a spring AFO. Pt has custom AFOs that were originally made 4 years ago. Goals: improve balance, to be able to get into the shower with her tub bench without assist, to be able to get up 2 steps at sister's home with railings on both sides but too far apart to reach both sides, to be able to drive adapted car. Pt is staying at other sister's house with one step to  get inside and currently picking up L leg with UEs and then stepping in. Pt steps down leading with LLE. Pt wants to work on walking side to side as this is challenging and she had not yet progressed to this at rehab. Pt able to get in/out the car with assist only to bring BLEs into the car. Hoping to drive by June or July with adapted car (already has one). Pt needs assist bringing LEs into and out of hospital bed. Pt able to perform sit>stand without assist from [facility] with a pillow on the seat for a boost. Pt denies any falls in the past 6 months. Pt has been increasing her time alone at home up to a few hours at a time to become more independent. Pt has both manual and power WC. Uses power WC at home, uses manual WC out in community. Pt able to  self propel manual WC. Pt needs assist putting pants around ankles but she can pull them up. Pt can put on her shoes but needs help getting her leg up to do so. Pt able to tie her shoelaces on her own. Pt has a tub/shower unit at home with a tub bench. No grab bars in the shower. Has hand held shower head. Pt currently taking a sponge bath. Pt has a regular height toilet with a BSC over top. Pt with h/o L wrist fx with no precautions, pt wore her soft brace to evaluation for comfort but reports her wrist has healed and is doing well following OT.     Limitations  Lifting;Standing;Walking;House hold activities    How long can you sit comfortably?  n/a    How long can you stand comfortably?  3 minutes without UE assist, limited by fatigue    How long can you walk comfortably?  100 ft, limited by fatigue    Currently in Pain?  No/denies            Standing in // bars:    weight left and right with focus on hip shift rather than trunk shift, BUE support, 10x each side  large step LLE 6x, RLE 6x,  BUE support, heavy use of UE's  Ambulate length of // bars 2x length with BUE support, cueing for weight shift and foot clearance.     Seated;   Isometrics: -hip flexion into PT hands 12x 3 second holds each LE, single LE at a time -hip abduction into PT hands 12x 3 second holds -hip adduction into PT hands 12x 3 second holds  Weighted ball (3000 Gr)  -modified seated posterior lean with forward crunch for full trunk/abdomen activation x10 -arc from one knee to another x10  -upright raise with straight arm 15x -forward lateral shift onto side of each LE 10x   Seated rest breaks and use of ice pack utilized to keep body temperature at functional range to reduce MS exacerbation.  Pt educated throughout session about proper posture and technique with exercises. Improved exercise technique, movement at target joints, use of target muscles after min to mod verbal, visual, tactile  cues   Patient more fatigued this session due to recent visit to ED. She continues to be very motivated throughout session despite this fatigue. Core stabilization continues to progress with patient tolerating more difficult interventions. The patient will continue to benefit from skilled therapeutic intervention to address deficits in strength, mobility, balance, and function for improved overall QOL and safety       PT Education - 04/24/19  1603    Education provided  Yes    Education Details  exercise technique, body mechanics    Person(s) Educated  Patient    Methods  Explanation;Tactile cues;Verbal cues;Demonstration    Comprehension  Verbalized understanding;Returned demonstration;Verbal cues required;Tactile cues required       PT Short Term Goals - 03/13/19 1733      PT SHORT TERM GOAL #1   Title  Patient will be compliant with HEP 4-5 days/week for improved carryover between sessions.     Baseline  HEP compliancy between sessions 6/17 compliant    Time  2    Period  Weeks    Status  Achieved    Target Date  10/15/18      PT SHORT TERM GOAL #2   Title  Patient will report no falls/LOB to indicate improved stability with mobility     Baseline  6/17: no falls, one episode of instability 7/20: no falls    Time  2    Period  Weeks    Status  Achieved    Target Date  11/06/18        PT Long Term Goals - 03/13/19 0001      PT LONG TERM GOAL #1   Title  Patient will perform TUG in 40 seconds or less with walker to improve safe negotiation of environment.     Baseline  11/5: 45 seconds 10/9: deferred due to pain 5/26: 61 seconds 6/17: 60 seconds 7/20: 59 seconds 8/26:  55 seconds    Time  8    Period  Weeks    Status  Partially Met    Target Date  05/08/19      PT LONG TERM GOAL #2   Title  Pt's 53mT will improve to at least 0.5 m/s for improved safety ambulating in home    Baseline  03/13/19: 0.30 m/s 0.14 m/s; 5/15: .214m  7/10: .36 m/s  8/20: .42 m/s  9/26:  0.2675m10/14: .26 m/s improved hip and knee clearance 11/15: 0.61m38mmproved hip and knee clearance; 12/5: .29 m/s improved step length and forward moment: decreased foot drag. 05/09/2018: 0.21 m/s 2/6: .237 m/s 5/26: .23 m/s 6/17: .24 m/s  7/20: 0.26 m/s 8/26: 0.26 m/s 10/8: 0.28 m/s    Time  8    Period  Weeks    Status  Partially Met    Target Date  05/08/19      PT LONG TERM GOAL #3   Title  Pt's Bil hip F strength will improve to at least 2+/5 BLEs for improved gait mechanics and safety    Baseline  11/5: Hips: 2/5 knees 3+/5 10/8: deferred due to pain L: 2/5, R: 1/5 7/10: 2/5 bilaterally  8/20: 2/5 bilat 9/26: 2/5 bilat 10/14: 2/5 bilaterally ,improved hip flexor muscle activation 2/5: improved hip flexor and quad activation: 12/5: 2/5 ; 05/09/2018: 2/5 2/6: 3/5 knee, 2 to 2+/5 hip 5/26: 2-/5 hips 3/5 knees 6/17: 2-/5 hips 3/5 knees    Time  8    Period  Weeks    Status  Partially Met    Target Date  05/08/19      PT LONG TERM GOAL #4   Title  Pt's ABC Scale will improve to at least 40% to demonstrate improved balance    Baseline  11/5: 32.2% 10/9: 32.1% 28.13% 5/15; 24.4% 7/10: 23.4% 8/20: 22. 2% 9/26: 25.3% 10/14: 26.5% 11/15: 24.38% 12/5: 26%; 05/09/2018: 29% 2/6: 23.75% 526: 30% 6/17: 26.8%  7/2:  28% 8/26: 30.3%    Time  8    Period  Weeks    Status  Partially Met    Target Date  05/08/19      PT LONG TERM GOAL #5   Title  Patient will negotiate one step ~4 inches to enter house independently to increase functional independence in natural environment    Baseline  11/5: able to lift 1.75 inch 10/9: has lifted 1.5 inch in previous sessions /20: unable to do 9/26: patient too fatigued/not feeling well to perform 10/14: unable to perform at this time 11/15: unable to perform at this time; 12/5: able to lift 1 inch; 05/09/2018: unable to clear 4" step, able to lift foot ~ 1in 5/26: has been unable to perform 6/17: able to lift foot 1.25 inch 8/26: has been able to step up onto 1.5 inch step in  previous sessions    Time  8    Period  Weeks    Status  Partially Met    Target Date  05/08/19      PT LONG TERM GOAL #6   Title  Patient will negotiate 30 ft of changing surfaces outside such as pavement and parking lot to allow patient to walk into church.    Baseline  11/5; walks outside to cart at store ~20 ft 10/9: walks 20 ft outside at store now 12/5: does not walk outside 2/24: unable to test due to rain 5/26: unable since COVID 6/17: raining 7/20 too hot to test 8/26: unable to walk outside due to heat and COVID    Time  8    Period  Weeks    Status  Partially Met    Target Date  05/08/19      PT LONG TERM GOAL #7   Title  Patient will stand 5 minutes to increase standing duration to allow patient to return to washing dishes and clothes.     Baseline  11/5: able to perform duration of sponge bath in room ~9 minutes 10/9: deferred due to pain 12/5: 56 seconds; 05/09/2018: 1 min 18 sec 2/6: 2 minutes last session 5/26: 1 minute 36 seconds limited by breathing 6/17: terminated due to abdomen pain  7/20 terminated due to overheating 8/26: able to stand 3 minutes at home    Time  8    Period  Weeks    Status  Achieved      PT LONG TERM GOAL #8   Title  Patient will negotiate tub with mod I to be able to bath/shower in bathroom requiring stability and ability to negotiate small area.     Baseline  11/5: has to sponge bath standing up in room     Time  8    Period  Weeks    Status  New    Target Date  05/08/19            Plan - 04/24/19 1604    Clinical Impression Statement  Patient more fatigued this session due to recent visit to ED. She continues to be very motivated throughout session despite this fatigue. Core stabilization continues to progress with patient tolerating more difficult interventions. The patient will continue to benefit from skilled therapeutic intervention to address deficits in strength, mobility, balance, and function for improved overall QOL and safety     Rehab Potential  Good    PT Frequency  2x / week    PT Duration  8 weeks    PT Treatment/Interventions  ADLs/Self Care  Home Management;Aquatic Therapy;Biofeedback;Cryotherapy;Electrical Stimulation;Iontophoresis 82m/ml Dexamethasone;Traction;Moist Heat;Ultrasound;DME Instruction;Gait training;Stair training;Functional mobility training;Therapeutic activities;Therapeutic exercise;Balance training;Neuromuscular re-education;Patient/family education;Orthotic Fit/Training;Wheelchair mobility training;Manual techniques;Compression bandaging;Passive range of motion;Dry needling;Energy conservation;Taping;Splinting    PT Next Visit Plan  strength/balance    PT Home Exercise Plan  to initiate next session (please incorporate hip flexor and adductor stretches in HEP if possible)    Consulted and Agree with Plan of Care  Patient       Patient will benefit from skilled therapeutic intervention in order to improve the following deficits and impairments:  Abnormal gait, Decreased activity tolerance, Decreased balance, Decreased coordination, Decreased endurance, Decreased knowledge of use of DME, Decreased mobility, Decreased range of motion, Decreased safety awareness, Decreased strength, Difficulty walking, Impaired perceived functional ability, Impaired sensation, Impaired tone, Impaired UE functional use, Improper body mechanics, Postural dysfunction  Visit Diagnosis: Muscle weakness (generalized)  Other abnormalities of gait and mobility  Unsteadiness on feet     Problem List Patient Active Problem List   Diagnosis Date Noted  . Abscess of female pelvis   . SVT (supraventricular tachycardia) ([place]   . Radial styloid tenosynovitis 03/12/2018  . Wheelchair confinement 02/27/2018  . Localized osteoporosis with current pathological fracture with routine healing 01/19/2017  . Wrist fracture 01/16/2017  . Sprain of ankle 03/23/2016  . Closed fracture of lateral malleolus 03/16/2016  . Health  care maintenance 01/24/2016  . Blood pressure elevated without history of HTN 10/25/2015  . Essential hypertension 10/25/2015  . Multiple sclerosis ([place] 10/02/2015  . Chronic left shoulder pain 07/19/2015  . Multiple sclerosis exacerbation ([place] 07/14/2015  . MS (multiple sclerosis) ([place] 11/26/2014  . Increased body mass index 11/26/2014  . HPV test positive 11/26/2014  . Status post laparoscopic supracervical hysterectomy 11/26/2014  . Galactorrhea 11/26/2014  . Back ache 05/21/2014  . Adiposity 05/21/2014  . Disordered sleep 05/21/2014  . Muscle spasticity 05/21/2014  . Spasticity 05/21/2014  . Calculus of kidney 12/09/2013  . Renal colic 096/28/3662 . Hypercholesteremia 08/19/2013  . Hereditary and idiopathic neuropathy 08/19/2013  . Hypercholesterolemia without hypertriglyceridemia 08/19/2013  . Bladder infection, chronic 07/25/2012  . Disorder of bladder function 07/25/2012  . Incomplete bladder emptying 07/25/2012  . Microscopic hematuria 07/25/2012  . Right upper quadrant pain 07/25/2012   MJanna Arch PT, DPT   04/24/2019, 4:07 PM  [place] [address] [address]Phone: [phone]  Fax:  [phone] Name: JAlexza NorbeckRamseur MRN: [id number] of Birth: 601/29/1962

## 2019-04-29 ENCOUNTER — Ambulatory Visit
Admission: RE | Admit: 2019-04-29 | Discharge: 2019-04-29 | Disposition: A | Discharge status: Home / Self Care | Payer: Medicare HMO | Source: Ambulatory Visit | Attending: Internal Medicine | Admitting: Internal Medicine

## 2019-04-29 ENCOUNTER — Other Ambulatory Visit: Payer: Self-pay

## 2019-04-29 ENCOUNTER — Ambulatory Visit: Payer: Medicare HMO

## 2019-04-29 DIAGNOSIS — M6281 Muscle weakness (generalized): Secondary | ICD-10-CM | POA: Diagnosis not present

## 2019-04-29 DIAGNOSIS — R2689 Other abnormalities of gait and mobility: Secondary | ICD-10-CM

## 2019-04-29 DIAGNOSIS — Z1231 Encounter for screening mammogram for malignant neoplasm of breast: Secondary | ICD-10-CM | POA: Diagnosis not present

## 2019-04-29 DIAGNOSIS — R2681 Unsteadiness on feet: Secondary | ICD-10-CM

## 2019-04-29 NOTE — Therapy (Signed)
[place] [facility] SERVICES [place], [place], [address] Phone: [phone]   Fax:  [phone]  Physical Therapy Treatment  Patient Details  Name: Lisa Williams MRN: [id number] Date of Birth: Sep 15, 1960 Referring Provider (PT): Gurney Maxin, MD   Encounter Date: 04/29/2019  PT End of Session - 04/29/19 1144    Visit Number  [id number]    Number of Visits  143    Date for PT Re-Evaluation  05/08/19    Authorization Type  8/10 PN 03/25/19    PT Start Time  1030    PT Stop Time  1114    PT Time Calculation (min)  44 min    Equipment Utilized During Treatment  Gait belt    Activity Tolerance  Patient tolerated treatment well   heat intolerance   Behavior During Therapy  [facility] for tasks assessed/performed       Past Medical History:  Diagnosis Date  . Abdominal pain, right upper quadrant   . Back pain   . Calculus of kidney 12/09/2013  . Chronic back pain    unspecified  . Chronic left shoulder pain 07/19/2015  . Functional disorder of bladder    other  . Galactorrhea 11/26/2014   Chronic   . Hereditary and idiopathic neuropathy 08/19/2013  . HPV test positive   . Hypercholesteremia 08/19/2013  . Incomplete bladder emptying   . Microscopic hematuria   . MS (multiple sclerosis) ([place])   . Muscle spasticity 05/21/2014  . Nonspecific findings on examination of urine    other  . Osteopenia   . Status post laparoscopic supracervical hysterectomy 11/26/2014  . Tobacco user 11/26/2014  . Wrist fracture     Past Surgical History:  Procedure Laterality Date  . bilateral tubal ligation  1996  . BREAST CYST EXCISION Left 2002  . KNEE SURGERY     right  . LAPAROSCOPIC SUPRACERVICAL HYSTERECTOMY  08/05/2013  . ORIF WRIST FRACTURE Left 01/17/2017   Procedure: OPEN REDUCTION INTERNAL FIXATION (ORIF) WRIST FRACTURE;  Surgeon: Lovell Sheehan, MD;  Location: [facility] ORS;  Service: Orthopedics;  Laterality: Left;  . TUBAL LIGATION  Bilateral   . VAGINAL HYSTERECTOMY  03/2006    There were no vitals filed for this visit.  Subjective Assessment - 04/29/19 1143    Subjective  Patient reports she is doing well. No falls or LOB since last session. Has been compliant with HEP.    Pertinent History  Pt is a 58 y/o F who presents with BLE weakness and difficulty ambulating due to h/o MS that was diagnosed in 1995. Pt denies any pain associated. Pt was admitted to the hospital in November 2018 due to MS exacerbation. Nov-Dec to HHPT-->[facility] until end of Jan. After rehab the pt was able to ambulate up to 100 ft with the RW. Pt has Bil AFO, her therapist at [facility] thinks she needs a new AFO as her feet still drag when she ambulates, pt reports HHPT mentioned a spring AFO. Pt has custom AFOs that were originally made 4 years ago. Goals: improve balance, to be able to get into the shower with her tub bench without assist, to be able to get up 2 steps at sister's home with railings on both sides but too far apart to reach both sides, to be able to drive adapted car. Pt is staying at other sister's house with one step to get inside and currently picking up L leg with UEs and then stepping  in. Pt steps down leading with LLE. Pt wants to work on walking side to side as this is challenging and she had not yet progressed to this at rehab. Pt able to get in/out the car with assist only to bring BLEs into the car. Hoping to drive by June or July with adapted car (already has one). Pt needs assist bringing LEs into and out of hospital bed. Pt able to perform sit>stand without assist from [facility] with a pillow on the seat for a boost. Pt denies any falls in the past 6 months. Pt has been increasing her time alone at home up to a few hours at a time to become more independent. Pt has both manual and power WC. Uses power WC at home, uses manual WC out in community. Pt able to self propel manual WC. Pt needs assist putting pants around ankles but she can  pull them up. Pt can put on her shoes but needs help getting her leg up to do so. Pt able to tie her shoelaces on her own. Pt has a tub/shower unit at home with a tub bench. No grab bars in the shower. Has hand held shower head. Pt currently taking a sponge bath. Pt has a regular height toilet with a BSC over top. Pt with h/o L wrist fx with no precautions, pt wore her soft brace to evaluation for comfort but reports her wrist has healed and is doing well following OT.     Limitations  Lifting;Standing;Walking;House hold activities    How long can you sit comfortably?  n/a    How long can you stand comfortably?  3 minutes without UE assist, limited by fatigue    How long can you walk comfortably?  100 ft, limited by fatigue    Currently in Pain?  No/denies          standing in // bars:  weight shift left and right with focus on hip shift rather than pulling with UE's 15x. step over green line  BUE support 10x each side; cueing for "knee to sky for clearance"  Standing abduction facing forwards 8x each   Standing interventions require seated icing breaks between switching of limbs due to fatigue.    Seated;   Isometrics: -hip flexion into PT hands 12x 3 second holds each LE, single LE at a time -hip abduction into PT hands 12x 3 second holds  -hip adduction into PT hands 12x 3 second holds   Cross body jabs with increasing difficulty and range, increasing challenge to L, R, duck for core stability and postural reactions. -Jab L , R duck for cross body coordination and postural control/righting reactions 10x -Jab R, L duck for cross body coordination and postural control/righting reactions 10x -Cross body elbowfor abdominal strengthening and postural challenge x 60 seconds Seated rest breaks and use of ice pack utilized to keep body temperature at functional range to reduce MS exacerbation.  Pt educated throughout session about proper posture and technique with exercises.  Improved exercise technique, movement at target joints, use of target muscles after min to mod verbal, visual, tactile cues.  Patient demonstrated improved ability to negotiate an obstacle on the ground with improved hip/foot clearance bilaterally with cueing for shift and clearing knee to ceiling. Patient continues to progress with functional core and stability in seated position with challenge to prolonged muscle recruitment patterns. The patient will continue to benefit from skilled therapeutic intervention to address deficits in strength, mobility, balance, and function for  improved overall QOL and safety                 PT Education - 04/29/19 1144    Education provided  Yes    Education Details  exercise technique, body mechanics    Person(s) Educated  Patient    Methods  Explanation;Demonstration;Tactile cues;Verbal cues    Comprehension  Verbalized understanding;Returned demonstration;Verbal cues required;Tactile cues required       PT Short Term Goals - 03/13/19 1733      PT SHORT TERM GOAL #1   Title  Patient will be compliant with HEP 4-5 days/week for improved carryover between sessions.     Baseline  HEP compliancy between sessions 6/17 compliant    Time  2    Period  Weeks    Status  Achieved    Target Date  10/15/18      PT SHORT TERM GOAL #2   Title  Patient will report no falls/LOB to indicate improved stability with mobility     Baseline  6/17: no falls, one episode of instability 7/20: no falls    Time  2    Period  Weeks    Status  Achieved    Target Date  11/06/18        PT Long Term Goals - 03/13/19 0001      PT LONG TERM GOAL #1   Title  Patient will perform TUG in 40 seconds or less with walker to improve safe negotiation of environment.     Baseline  11/5: 45 seconds 10/9: deferred due to pain 5/26: 61 seconds 6/17: 60 seconds 7/20: 59 seconds 8/26:  55 seconds    Time  8    Period  Weeks    Status  Partially Met    Target Date   05/08/19      PT LONG TERM GOAL #2   Title  Pt's 75mT will improve to at least 0.5 m/s for improved safety ambulating in home    Baseline  03/13/19: 0.30 m/s 0.14 m/s; 5/15: .233m  7/10: .36 m/s  8/20: .42 m/s  9/26: 0.2614m10/14: .26 m/s improved hip and knee clearance 11/15: 0.51m71mmproved hip and knee clearance; 12/5: .29 m/s improved step length and forward moment: decreased foot drag. 05/09/2018: 0.21 m/s 2/6: .237 m/s 5/26: .23 m/s 6/17: .24 m/s  7/20: 0.26 m/s 8/26: 0.26 m/s 10/8: 0.28 m/s    Time  8    Period  Weeks    Status  Partially Met    Target Date  05/08/19      PT LONG TERM GOAL #3   Title  Pt's Bil hip F strength will improve to at least 2+/5 BLEs for improved gait mechanics and safety    Baseline  11/5: Hips: 2/5 knees 3+/5 10/8: deferred due to pain L: 2/5, R: 1/5 7/10: 2/5 bilaterally  8/20: 2/5 bilat 9/26: 2/5 bilat 10/14: 2/5 bilaterally ,improved hip flexor muscle activation 2/5: improved hip flexor and quad activation: 12/5: 2/5 ; 05/09/2018: 2/5 2/6: 3/5 knee, 2 to 2+/5 hip 5/26: 2-/5 hips 3/5 knees 6/17: 2-/5 hips 3/5 knees    Time  8    Period  Weeks    Status  Partially Met    Target Date  05/08/19      PT LONG TERM GOAL #4   Title  Pt's ABC Scale will improve to at least 40% to demonstrate improved balance    Baseline  11/5: 32.2% 10/9: 32.1% 28.13%  5/15; 24.4% 7/10: 23.4% 8/20: 22. 2% 9/26: 25.3% 10/14: 26.5% 11/15: 24.38% 12/5: 26%; 05/09/2018: 29% 2/6: 23.75% 526: 30% 6/17: 26.8%  7/2: 28% 8/26: 30.3%    Time  8    Period  Weeks    Status  Partially Met    Target Date  05/08/19      PT LONG TERM GOAL #5   Title  Patient will negotiate one step ~4 inches to enter house independently to increase functional independence in natural environment    Baseline  11/5: able to lift 1.75 inch 10/9: has lifted 1.5 inch in previous sessions /20: unable to do 9/26: patient too fatigued/not feeling well to perform 10/14: unable to perform at this time 11/15: unable to  perform at this time; 12/5: able to lift 1 inch; 05/09/2018: unable to clear 4" step, able to lift foot ~ 1in 5/26: has been unable to perform 6/17: able to lift foot 1.25 inch 8/26: has been able to step up onto 1.5 inch step in previous sessions    Time  8    Period  Weeks    Status  Partially Met    Target Date  05/08/19      PT LONG TERM GOAL #6   Title  Patient will negotiate 30 ft of changing surfaces outside such as pavement and parking lot to allow patient to walk into church.    Baseline  11/5; walks outside to cart at store ~20 ft 10/9: walks 20 ft outside at store now 12/5: does not walk outside 2/24: unable to test due to Lisa 5/26: unable since COVID 6/17: raining 7/20 too hot to test 8/26: unable to walk outside due to heat and COVID    Time  8    Period  Weeks    Status  Partially Met    Target Date  05/08/19      PT LONG TERM GOAL #7   Title  Patient will stand 5 minutes to increase standing duration to allow patient to return to washing dishes and clothes.     Baseline  11/5: able to perform duration of sponge bath in room ~9 minutes 10/9: deferred due to pain 12/5: 56 seconds; 05/09/2018: 1 min 18 sec 2/6: 2 minutes last session 5/26: 1 minute 36 seconds limited by breathing 6/17: terminated due to abdomen pain  7/20 terminated due to overheating 8/26: able to stand 3 minutes at home    Time  8    Period  Weeks    Status  Achieved      PT LONG TERM GOAL #8   Title  Patient will negotiate tub with mod I to be able to bath/shower in bathroom requiring stability and ability to negotiate small area.     Baseline  11/5: has to sponge bath standing up in room     Time  8    Period  Weeks    Status  New    Target Date  05/08/19            Plan - 04/29/19 1150    Clinical Impression Statement  Patient demonstrated improved ability to negotiate an obstacle on the ground with improved hip/foot clearance bilaterally with cueing for shift and clearing knee to ceiling. Patient  continues to progress with functional core and stability in seated position with challenge to prolonged muscle recruitment patterns. The patient will continue to benefit from skilled therapeutic intervention to address deficits in strength, mobility, balance, and function for improved  overall QOL and safety    Rehab Potential  Good    PT Frequency  2x / week    PT Duration  8 weeks    PT Treatment/Interventions  ADLs/Self Care Home Management;Aquatic Therapy;Biofeedback;Cryotherapy;Electrical Stimulation;Iontophoresis 59m/ml Dexamethasone;Traction;Moist Heat;Ultrasound;DME Instruction;Gait training;Stair training;Functional mobility training;Therapeutic activities;Therapeutic exercise;Balance training;Neuromuscular re-education;Patient/family education;Orthotic Fit/Training;Wheelchair mobility training;Manual techniques;Compression bandaging;Passive range of motion;Dry needling;Energy conservation;Taping;Splinting    PT Next Visit Plan  strength/balance    PT Home Exercise Plan  to initiate next session (please incorporate hip flexor and adductor stretches in HEP if possible)    Consulted and Agree with Plan of Care  Patient       Patient will benefit from skilled therapeutic intervention in order to improve the following deficits and impairments:  Abnormal gait, Decreased activity tolerance, Decreased balance, Decreased coordination, Decreased endurance, Decreased knowledge of use of DME, Decreased mobility, Decreased range of motion, Decreased safety awareness, Decreased strength, Difficulty walking, Impaired perceived functional ability, Impaired sensation, Impaired tone, Impaired UE functional use, Improper body mechanics, Postural dysfunction  Visit Diagnosis: Muscle weakness (generalized)  Other abnormalities of gait and mobility  Unsteadiness on feet     Problem List Patient Active Problem List   Diagnosis Date Noted  . Abscess of female pelvis   . SVT (supraventricular tachycardia)  ([place]   . Radial styloid tenosynovitis 03/12/2018  . Wheelchair confinement 02/27/2018  . Localized osteoporosis with current pathological fracture with routine healing 01/19/2017  . Wrist fracture 01/16/2017  . Sprain of ankle 03/23/2016  . Closed fracture of lateral malleolus 03/16/2016  . Health care maintenance 01/24/2016  . Blood pressure elevated without history of HTN 10/25/2015  . Essential hypertension 10/25/2015  . Multiple sclerosis ([place] 10/02/2015  . Chronic left shoulder pain 07/19/2015  . Multiple sclerosis exacerbation ([place] 07/14/2015  . MS (multiple sclerosis) ([place] 11/26/2014  . Increased body mass index 11/26/2014  . HPV test positive 11/26/2014  . Status post laparoscopic supracervical hysterectomy 11/26/2014  . Galactorrhea 11/26/2014  . Back ache 05/21/2014  . Adiposity 05/21/2014  . Disordered sleep 05/21/2014  . Muscle spasticity 05/21/2014  . Spasticity 05/21/2014  . Calculus of kidney 12/09/2013  . Renal colic 033/54/5625 . Hypercholesteremia 08/19/2013  . Hereditary and idiopathic neuropathy 08/19/2013  . Hypercholesterolemia without hypertriglyceridemia 08/19/2013  . Bladder infection, chronic 07/25/2012  . Disorder of bladder function 07/25/2012  . Incomplete bladder emptying 07/25/2012  . Microscopic hematuria 07/25/2012  . Right upper quadrant pain 07/25/2012   MJanna Arch PT, DPT   04/29/2019, 12:56 PM  [place] [address] [address]Phone: [phone]  Fax:  [phone] Name: JNikaya NasbyRamseur MRN: [id number] of Birth: [birth year]

## 2019-05-01 ENCOUNTER — Ambulatory Visit: Payer: Medicare HMO

## 2019-05-01 ENCOUNTER — Other Ambulatory Visit: Payer: Self-pay

## 2019-05-01 DIAGNOSIS — R2689 Other abnormalities of gait and mobility: Secondary | ICD-10-CM

## 2019-05-01 DIAGNOSIS — R2681 Unsteadiness on feet: Secondary | ICD-10-CM

## 2019-05-01 DIAGNOSIS — M6281 Muscle weakness (generalized): Secondary | ICD-10-CM

## 2019-05-01 NOTE — Therapy (Signed)
[place] MAIN [facility] SERVICES [address] [place], [place], [address] Phone: [phone]   Fax:  [phone]  Physical Therapy Treatment  Patient Details  Name: Sunday Klos Umali MRN: [id number] Date of Birth: 1960/11/23 Referring Provider (PT): Gurney Maxin, MD   Encounter Date: 05/01/2019  PT End of Session - 05/01/19 1219    Visit Number  [id number]    Number of Visits  143    Date for PT Re-Evaluation  05/08/19    Authorization Type  9/10 PN 03/25/19    PT Start Time  1030    PT Stop Time  1116    PT Time Calculation (min)  46 min    Equipment Utilized During Treatment  Gait belt    Activity Tolerance  Patient tolerated treatment well   heat intolerance   Behavior During Therapy  [facility] for tasks assessed/performed       Past Medical History:  Diagnosis Date  . Abdominal pain, right upper quadrant   . Back pain   . Calculus of kidney 12/09/2013  . Chronic back pain    unspecified  . Chronic left shoulder pain 07/19/2015  . Functional disorder of bladder    other  . Galactorrhea 11/26/2014   Chronic   . Hereditary and idiopathic neuropathy 08/19/2013  . HPV test positive   . Hypercholesteremia 08/19/2013  . Incomplete bladder emptying   . Microscopic hematuria   . MS (multiple sclerosis) ([place])   . Muscle spasticity 05/21/2014  . Nonspecific findings on examination of urine    other  . Osteopenia   . Status post laparoscopic supracervical hysterectomy 11/26/2014  . Tobacco user 11/26/2014  . Wrist fracture     Past Surgical History:  Procedure Laterality Date  . bilateral tubal ligation  1996  . BREAST CYST EXCISION Left 2002  . KNEE SURGERY     right  . LAPAROSCOPIC SUPRACERVICAL HYSTERECTOMY  08/05/2013  . ORIF WRIST FRACTURE Left 01/17/2017   Procedure: OPEN REDUCTION INTERNAL FIXATION (ORIF) WRIST FRACTURE;  Surgeon: Lovell Sheehan, MD;  Location: [facility] ORS;  Service: Orthopedics;  Laterality: Left;  . TUBAL LIGATION  Bilateral   . VAGINAL HYSTERECTOMY  03/2006    There were no vitals filed for this visit.  Subjective Assessment - 05/01/19 1217    Subjective  Patient reports no falls or LOB since last session. Is compliant with with HEP.    Pertinent History  Pt is a 58 y/o F who presents with BLE weakness and difficulty ambulating due to h/o MS that was diagnosed in 1995. Pt denies any pain associated. Pt was admitted to the hospital in November 2018 due to MS exacerbation. Nov-Dec to HHPT-->[facility] until end of Jan. After rehab the pt was able to ambulate up to 100 ft with the RW. Pt has Bil AFO, her therapist at [place] thinks she needs a new AFO as her feet still drag when she ambulates, pt reports HHPT mentioned a spring AFO. Pt has custom AFOs that were originally made 4 years ago. Goals: improve balance, to be able to get into the shower with her tub bench without assist, to be able to get up 2 steps at sister's home with railings on both sides but too far apart to reach both sides, to be able to drive adapted car. Pt is staying at other sister's house with one step to get inside and currently picking up L leg with UEs and then stepping in. Pt steps down  leading with LLE. Pt wants to work on walking side to side as this is challenging and she had not yet progressed to this at rehab. Pt able to get in/out the car with assist only to bring BLEs into the car. Hoping to drive by June or July with adapted car (already has one). Pt needs assist bringing LEs into and out of hospital bed. Pt able to perform sit>stand without assist from [facility] with a pillow on the seat for a boost. Pt denies any falls in the past 6 months. Pt has been increasing her time alone at home up to a few hours at a time to become more independent. Pt has both manual and power WC. Uses power WC at home, uses manual WC out in community. Pt able to self propel manual WC. Pt needs assist putting pants around ankles but she can pull them up. Pt  can put on her shoes but needs help getting her leg up to do so. Pt able to tie her shoelaces on her own. Pt has a tub/shower unit at home with a tub bench. No grab bars in the shower. Has hand held shower head. Pt currently taking a sponge bath. Pt has a regular height toilet with a BSC over top. Pt with h/o L wrist fx with no precautions, pt wore her soft brace to evaluation for comfort but reports her wrist has healed and is doing well following OT.     Limitations  Lifting;Standing;Walking;House hold activities    How long can you sit comfortably?  n/a    How long can you stand comfortably?  3 minutes without UE assist, limited by fatigue    How long can you walk comfortably?  100 ft, limited by fatigue    Currently in Pain?  No/denies             Treatment: Standing with RW: weight shifts L, R with focus on hip shift rather than pulling with UE's 10x each side, challenge with stability due to wet shoes  Ambulate in carpet hallway with RW , CGA, and w/c follow. Cueing for shifting of weight and hip/knee flexion for foot clearance. Occasional shuffle stepping of LLE due to friction of wet shoe on carpet. x49 ft.   Standing with RW: lateral mini step x 8 RLE, seated rest break then performed LLE with excellent knee flexion x 9    Seated: Weighted ball: (3000 gr) -overhead raise/press 10x; cueing for elbows in -chest press/row 10x with focus on scapular retraction  -arc from lateral knee to opposite knee 10x  Arms at 90 hands together forward lean, bring arms out into abduction back in together and lean back, x 10  RTB knee abduction 15x Ball squeeze adduction 10x 3 second holds     Seated rest breaks and use of ice pack utilized to keep body temperature at functional range to reduce MS exacerbation.  Pt educated throughout session about proper posture and technique with exercises. Improved exercise technique, movement at target joints, use of target muscles after min to mod  verbal, visual, tactile cues.   Patient initially challenged with standing interventions due to wet shoes however improved as feet dried. Increased coordination with unilateral LE movement performed with excellent knee flexion of LLE demonstrated. The patient will continue to benefit from skilled therapeutic intervention to address deficits in strength, mobility, balance, and function for improved overall QOL and safety             PT Education -  05/01/19 1219    Education provided  Yes    Education Details  exercise technique, body mechanics    Person(s) Educated  Patient    Methods  Explanation;Demonstration;Tactile cues;Verbal cues    Comprehension  Verbalized understanding;Returned demonstration;Verbal cues required;Tactile cues required       PT Short Term Goals - 03/13/19 1733      PT SHORT TERM GOAL #1   Title  Patient will be compliant with HEP 4-5 days/week for improved carryover between sessions.     Baseline  HEP compliancy between sessions 6/17 compliant    Time  2    Period  Weeks    Status  Achieved    Target Date  10/15/18      PT SHORT TERM GOAL #2   Title  Patient will report no falls/LOB to indicate improved stability with mobility     Baseline  6/17: no falls, one episode of instability 7/20: no falls    Time  2    Period  Weeks    Status  Achieved    Target Date  11/06/18        PT Long Term Goals - 03/13/19 0001      PT LONG TERM GOAL #1   Title  Patient will perform TUG in 40 seconds or less with walker to improve safe negotiation of environment.     Baseline  11/5: 45 seconds 10/9: deferred due to pain 5/26: 61 seconds 6/17: 60 seconds 7/20: 59 seconds 8/26:  55 seconds    Time  8    Period  Weeks    Status  Partially Met    Target Date  05/08/19      PT LONG TERM GOAL #2   Title  Pt's 62mT will improve to at least 0.5 m/s for improved safety ambulating in home    Baseline  03/13/19: 0.30 m/s 0.14 m/s; 5/15: .21m  7/10: .36 m/s   8/20: .42 m/s  9/26: 0.2675m10/14: .26 m/s improved hip and knee clearance 11/15: 0.34m42mmproved hip and knee clearance; 12/5: .29 m/s improved step length and forward moment: decreased foot drag. 05/09/2018: 0.21 m/s 2/6: .237 m/s 5/26: .23 m/s 6/17: .24 m/s  7/20: 0.26 m/s 8/26: 0.26 m/s 10/8: 0.28 m/s    Time  8    Period  Weeks    Status  Partially Met    Target Date  05/08/19      PT LONG TERM GOAL #3   Title  Pt's Bil hip F strength will improve to at least 2+/5 BLEs for improved gait mechanics and safety    Baseline  11/5: Hips: 2/5 knees 3+/5 10/8: deferred due to pain L: 2/5, R: 1/5 7/10: 2/5 bilaterally  8/20: 2/5 bilat 9/26: 2/5 bilat 10/14: 2/5 bilaterally ,improved hip flexor muscle activation 2/5: improved hip flexor and quad activation: 12/5: 2/5 ; 05/09/2018: 2/5 2/6: 3/5 knee, 2 to 2+/5 hip 5/26: 2-/5 hips 3/5 knees 6/17: 2-/5 hips 3/5 knees    Time  8    Period  Weeks    Status  Partially Met    Target Date  05/08/19      PT LONG TERM GOAL #4   Title  Pt's ABC Scale will improve to at least 40% to demonstrate improved balance    Baseline  11/5: 32.2% 10/9: 32.1% 28.13% 5/15; 24.4% 7/10: 23.4% 8/20: 22. 2% 9/26: 25.3% 10/14: 26.5% 11/15: 24.38% 12/5: 26%; 05/09/2018: 29% 2/6: 23.75% 526: 30% 6/17: 26.8%  7/2: 28% 8/26: 30.3%    Time  8    Period  Weeks    Status  Partially Met    Target Date  05/08/19      PT LONG TERM GOAL #5   Title  Patient will negotiate one step ~4 inches to enter house independently to increase functional independence in natural environment    Baseline  11/5: able to lift 1.75 inch 10/9: has lifted 1.5 inch in previous sessions /20: unable to do 9/26: patient too fatigued/not feeling well to perform 10/14: unable to perform at this time 11/15: unable to perform at this time; 12/5: able to lift 1 inch; 05/09/2018: unable to clear 4" step, able to lift foot ~ 1in 5/26: has been unable to perform 6/17: able to lift foot 1.25 inch 8/26: has been able to step up  onto 1.5 inch step in previous sessions    Time  8    Period  Weeks    Status  Partially Met    Target Date  05/08/19      PT LONG TERM GOAL #6   Title  Patient will negotiate 30 ft of changing surfaces outside such as pavement and parking lot to allow patient to walk into church.    Baseline  11/5; walks outside to cart at store ~20 ft 10/9: walks 20 ft outside at store now 12/5: does not walk outside 2/24: unable to test due to rain 5/26: unable since COVID 6/17: raining 7/20 too hot to test 8/26: unable to walk outside due to heat and COVID    Time  8    Period  Weeks    Status  Partially Met    Target Date  05/08/19      PT LONG TERM GOAL #7   Title  Patient will stand 5 minutes to increase standing duration to allow patient to return to washing dishes and clothes.     Baseline  11/5: able to perform duration of sponge bath in room ~9 minutes 10/9: deferred due to pain 12/5: 56 seconds; 05/09/2018: 1 min 18 sec 2/6: 2 minutes last session 5/26: 1 minute 36 seconds limited by breathing 6/17: terminated due to abdomen pain  7/20 terminated due to overheating 8/26: able to stand 3 minutes at home    Time  8    Period  Weeks    Status  Achieved      PT LONG TERM GOAL #8   Title  Patient will negotiate tub with mod I to be able to bath/shower in bathroom requiring stability and ability to negotiate small area.     Baseline  11/5: has to sponge bath standing up in room     Time  8    Period  Weeks    Status  New    Target Date  05/08/19            Plan - 05/01/19 1221    Clinical Impression Statement  Patient initially challenged with standing interventions due to wet shoes however improved as feet dried. Increased coordination with unilateral LE movement performed with excellent knee flexion of LLE demonstrated. The patient will continue to benefit from skilled therapeutic intervention to address deficits in strength, mobility, balance, and function for improved overall QOL and  safety    Rehab Potential  Good    PT Frequency  2x / week    PT Duration  8 weeks    PT Treatment/Interventions  ADLs/Self Care Home Management;Aquatic Therapy;Biofeedback;Cryotherapy;Dealer  Stimulation;Iontophoresis 47m/ml Dexamethasone;Traction;Moist Heat;Ultrasound;DME Instruction;Gait training;Stair training;Functional mobility training;Therapeutic activities;Therapeutic exercise;Balance training;Neuromuscular re-education;Patient/family education;Orthotic Fit/Training;Wheelchair mobility training;Manual techniques;Compression bandaging;Passive range of motion;Dry needling;Energy conservation;Taping;Splinting    PT Next Visit Plan  strength/balance    PT Home Exercise Plan  to initiate next session (please incorporate hip flexor and adductor stretches in HEP if possible)    Consulted and Agree with Plan of Care  Patient       Patient will benefit from skilled therapeutic intervention in order to improve the following deficits and impairments:  Abnormal gait, Decreased activity tolerance, Decreased balance, Decreased coordination, Decreased endurance, Decreased knowledge of use of DME, Decreased mobility, Decreased range of motion, Decreased safety awareness, Decreased strength, Difficulty walking, Impaired perceived functional ability, Impaired sensation, Impaired tone, Impaired UE functional use, Improper body mechanics, Postural dysfunction  Visit Diagnosis: Muscle weakness (generalized)  Other abnormalities of gait and mobility  Unsteadiness on feet     Problem List Patient Active Problem List   Diagnosis Date Noted  . Abscess of female pelvis   . SVT (supraventricular tachycardia) ([place]   . Radial styloid tenosynovitis 03/12/2018  . Wheelchair confinement 02/27/2018  . Localized osteoporosis with current pathological fracture with routine healing 01/19/2017  . Wrist fracture 01/16/2017  . Sprain of ankle 03/23/2016  . Closed fracture of lateral malleolus 03/16/2016  .  Health care maintenance 01/24/2016  . Blood pressure elevated without history of HTN 10/25/2015  . Essential hypertension 10/25/2015  . Multiple sclerosis ([place] 10/02/2015  . Chronic left shoulder pain 07/19/2015  . Multiple sclerosis exacerbation ([place] 07/14/2015  . MS (multiple sclerosis) ([place] 11/26/2014  . Increased body mass index 11/26/2014  . HPV test positive 11/26/2014  . Status post laparoscopic supracervical hysterectomy 11/26/2014  . Galactorrhea 11/26/2014  . Back ache 05/21/2014  . Adiposity 05/21/2014  . Disordered sleep 05/21/2014  . Muscle spasticity 05/21/2014  . Spasticity 05/21/2014  . Calculus of kidney 12/09/2013  . Renal colic 029/24/4628 . Hypercholesteremia 08/19/2013  . Hereditary and idiopathic neuropathy 08/19/2013  . Hypercholesterolemia without hypertriglyceridemia 08/19/2013  . Bladder infection, chronic 07/25/2012  . Disorder of bladder function 07/25/2012  . Incomplete bladder emptying 07/25/2012  . Microscopic hematuria 07/25/2012  . Right upper quadrant pain 07/25/2012   MJanna Arch PT, DPT   05/01/2019, 12:22 PM  [place] [facility] [address] [address]Phone: [phone]  Fax:  [phone] Name: JKatja BlueRamseur MRN: [id number] of Birth: [birth year]

## 2019-05-06 ENCOUNTER — Ambulatory Visit: Payer: Medicare HMO

## 2019-05-06 ENCOUNTER — Other Ambulatory Visit: Payer: Self-pay

## 2019-05-06 DIAGNOSIS — M6281 Muscle weakness (generalized): Secondary | ICD-10-CM | POA: Diagnosis not present

## 2019-05-06 DIAGNOSIS — R2689 Other abnormalities of gait and mobility: Secondary | ICD-10-CM

## 2019-05-06 DIAGNOSIS — R2681 Unsteadiness on feet: Secondary | ICD-10-CM

## 2019-05-06 NOTE — Therapy (Signed)
[place] [facility] SERVICES [address] [place], [place], [address] Phone: [phone]   Fax:  [phone]  Physical Therapy Treatment Physical Therapy/ Progress Note/ RECERT   Dates of reporting period  03/25/19  to   05/06/19   Patient Details  Name: Lisa Williams MRN: [id number] Date of Birth: 1960-06-29 Referring Provider (PT): Gurney Maxin, MD   Encounter Date: 05/06/2019  PT End of Session - 05/06/19 7517    Visit Number  140    Number of Visits  164    Date for PT Re-Evaluation  07/29/19    Authorization Type  10/10 ; next session 1/10 PN 05/06/19    PT Start Time  1030    PT Stop Time  1116    PT Time Calculation (min)  46 min    Equipment Utilized During Treatment  Gait belt    Activity Tolerance  Patient tolerated treatment well   heat intolerance   Behavior During Therapy  WFL for tasks assessed/performed       Past Medical History:  Diagnosis Date  . Abdominal pain, right upper quadrant   . Back pain   . Calculus of kidney 12/09/2013  . Chronic back pain    unspecified  . Chronic left shoulder pain 07/19/2015  . Functional disorder of bladder    other  . Galactorrhea 11/26/2014   Chronic   . Hereditary and idiopathic neuropathy 08/19/2013  . HPV test positive   . Hypercholesteremia 08/19/2013  . Incomplete bladder emptying   . Microscopic hematuria   . MS (multiple sclerosis) ([place])   . Muscle spasticity 05/21/2014  . Nonspecific findings on examination of urine    other  . Osteopenia   . Status post laparoscopic supracervical hysterectomy 11/26/2014  . Tobacco user 11/26/2014  . Wrist fracture     Past Surgical History:  Procedure Laterality Date  . bilateral tubal ligation  1996  . BREAST CYST EXCISION Left 2002  . KNEE SURGERY     right  . LAPAROSCOPIC SUPRACERVICAL HYSTERECTOMY  08/05/2013  . ORIF WRIST FRACTURE Left 01/17/2017   Procedure: OPEN REDUCTION INTERNAL FIXATION (ORIF) WRIST  FRACTURE;  Surgeon: Lovell Sheehan, MD;  Location: [facility] ORS;  Service: Orthopedics;  Laterality: Left;  . TUBAL LIGATION Bilateral   . VAGINAL HYSTERECTOMY  03/2006    There were no vitals filed for this visit.  Subjective Assessment - 05/06/19 1208    Subjective  Patient reports having a bad day yesterday with lots of cramping and tightness due to the weather. Has been compliant with HEP. No pain or cramps this morning.    Pertinent History  Pt is a 58 y/o F who presents with BLE weakness and difficulty ambulating due to h/o MS that was diagnosed in 1995. Pt denies any pain associated. Pt was admitted to the hospital in November 2018 due to MS exacerbation. Nov-Dec to HHPT-->[facility] until end of Jan. After rehab the pt was able to ambulate up to 100 ft with the RW. Pt has Bil AFO, her therapist at [facility] thinks she needs a new AFO as her feet still drag when she ambulates, pt reports HHPT mentioned a spring AFO. Pt has custom AFOs that were originally made 4 years ago. Goals: improve balance, to be able to get into the shower with her tub bench without assist, to be able to get up 2 steps at sister's home with railings on both sides but too far apart to reach  both sides, to be able to drive adapted car. Pt is staying at other sister's house with one step to get inside and currently picking up L leg with UEs and then stepping in. Pt steps down leading with LLE. Pt wants to work on walking side to side as this is challenging and she had not yet progressed to this at rehab. Pt able to get in/out the car with assist only to bring BLEs into the car. Hoping to drive by June or July with adapted car (already has one). Pt needs assist bringing LEs into and out of hospital bed. Pt able to perform sit>stand without assist from [facility] with a pillow on the seat for a boost. Pt denies any falls in the past 6 months. Pt has been increasing her time alone at home up to a few hours at a time to become more  independent. Pt has both manual and power WC. Uses power WC at home, uses manual WC out in community. Pt able to self propel manual WC. Pt needs assist putting pants around ankles but she can pull them up. Pt can put on her shoes but needs help getting her leg up to do so. Pt able to tie her shoelaces on her own. Pt has a tub/shower unit at home with a tub bench. No grab bars in the shower. Has hand held shower head. Pt currently taking a sponge bath. Pt has a regular height toilet with a BSC over top. Pt with h/o L wrist fx with no precautions, pt wore her soft brace to evaluation for comfort but reports her wrist has healed and is doing well following OT.     Limitations  Lifting;Standing;Walking;House hold activities    How long can you sit comfortably?  n/a    How long can you stand comfortably?  3 minutes without UE assist, limited by fatigue    How long can you walk comfortably?  100 ft, limited by fatigue    Currently in Pain?  No/denies            TUG: 41 seconds with RW 10 MWT: 44 seconds first trial due to obstacles and stiffness: 0.31 m/s second trial BLE strength: Hips 2+/5 abduction/adduction, flexion 2/5  Knees 3+/5  ABC: 34.7% 1 step: 1.9 inch lift with BUE support  30 ft outside: able to ambulate to cart in store with RW. Too cold to perform this session    Treat: Swiss ball: TrA contractions 10x 3 second holds pressing into ball with knees and hands Swiss ball overhead raises 12x with focus on upright posture and core stability Seated hamstring stretch with PT overpressure 30 second holds x 2 trials each LE  Patient's condition has the potential to improve in response to therapy. Maximum improvement is yet to be obtained. The anticipated improvement is attainable and reasonable in a generally predictable time.  Patient reports she is more independent and moving better every time, feels like progress is slow but steady.                    PT Education -  05/06/19 1210    Education provided  Yes    Education Details  exercise technique, goals, POC    Person(s) Educated  Patient    Methods  Explanation;Demonstration;Tactile cues;Verbal cues    Comprehension  Verbalized understanding;Returned demonstration;Verbal cues required;Tactile cues required       PT Short Term Goals - 03/13/19 1733      PT  SHORT TERM GOAL #1   Title  Patient will be compliant with HEP 4-5 days/week for improved carryover between sessions.     Baseline  HEP compliancy between sessions 6/17 compliant    Time  2    Period  Weeks    Status  Achieved    Target Date  10/15/18      PT SHORT TERM GOAL #2   Title  Patient will report no falls/LOB to indicate improved stability with mobility     Baseline  6/17: no falls, one episode of instability 7/20: no falls    Time  2    Period  Weeks    Status  Achieved    Target Date  11/06/18        PT Long Term Goals - 05/06/19 0001      PT LONG TERM GOAL #1   Title  Patient will perform TUG in 40 seconds or less with walker to improve safe negotiation of environment.     Baseline  12/29: 41 seconds with RW 11/5: 45 seconds 10/9: deferred due to pain 5/26: 61 seconds 6/17: 60 seconds 7/20: 59 seconds 8/26:  55 seconds    Time  12    Period  Weeks    Status  Partially Met    Target Date  07/29/19      PT LONG TERM GOAL #2   Title  Pt's 39mT will improve to at least 0.5 m/s for improved safety ambulating in home    Baseline  12/29: 0.31 m/s 03/13/19: 0.30 m/s 0.14 m/s; 5/15: .257m  7/10: .36 m/s  8/20: .42 m/s  9/26: 0.2626m10/14: .26 m/s improved hip and knee clearance 11/15: 0.43m86mmproved hip and knee clearance; 12/5: .29 m/s improved step length and forward moment: decreased foot drag. 05/09/2018: 0.21 m/s 2/6: .237 m/s 5/26: .23 m/s 6/17: .24 m/s  7/20: 0.26 m/s 8/26: 0.26 m/s 10/8: 0.28 m/s    Time  12    Period  Weeks    Status  Partially Met    Target Date  07/29/19      PT LONG TERM GOAL #3   Title  Pt's  Bil hip F strength will improve to at least 2+/5 BLEs for improved gait mechanics and safety    Baseline  12/29:  Hips 2+/5 abduction/adduction, flexion 2/5  Knees 3+/5  11/5: Hips: 2/5 knees 3+/5 10/8: deferred due to pain L: 2/5, R: 1/5 7/10: 2/5 bilaterally  8/20: 2/5 bilat 9/26: 2/5 bilat 10/14: 2/5 bilaterally ,improved hip flexor muscle activation 2/5: improved hip flexor and quad activation: 12/5: 2/5 ; 05/09/2018: 2/5 2/6: 3/5 knee, 2 to 2+/5 hip 5/26: 2-/5 hips 3/5 knees 6/17: 2-/5 hips 3/5 knees    Time  12    Period  Weeks    Status  Partially Met    Target Date  07/29/19      PT LONG TERM GOAL #4   Title  Pt's ABC Scale will improve to at least 40% to demonstrate improved balance    Baseline  12/29: 34.7% 11/5: 32.2% 10/9: 32.1% 28.13% 5/15; 24.4% 7/10: 23.4% 8/20: 22. 2% 9/26: 25.3% 10/14: 26.5% 11/15: 24.38% 12/5: 26%; 05/09/2018: 29% 2/6: 23.75% 526: 30% 6/17: 26.8%  7/2: 28% 8/26: 30.3%    Time  12    Period  Weeks    Status  Partially Met    Target Date  07/29/19      PT LONG TERM GOAL #5   Title  Patient will negotiate one step ~4 inches to enter house independently to increase functional independence in natural environment    Baseline  12/29: 1.9 inch lift after multiple attempts11/5: able to lift 1.75 inch 10/9: has lifted 1.5 inch in previous sessions /20: unable to do 9/26: patient too fatigued/not feeling well to perform 10/14: unable to perform at this time 11/15: unable to perform at this time; 12/5: able to lift 1 inch; 05/09/2018: unable to clear 4" step, able to lift foot ~ 1in 5/26: has been unable to perform 6/17: able to lift foot 1.25 inch 8/26: has been able to step up onto 1.5 inch step in previous sessions    Time  12    Period  Weeks    Status  Partially Met    Target Date  07/29/19      PT LONG TERM GOAL #6   Title  Patient will negotiate 30 ft of changing surfaces outside such as pavement and parking lot to allow patient to walk into church.    Baseline   12/29: ambulate to cart for stores, too cold to perform for PT this session but has performed multiple times at home11/5; walks outside to cart at store ~20 ft 10/9: walks 20 ft outside at store now 12/5: does not walk outside 2/24: unable to test due to rain 5/26: unable since COVID 6/17: raining 7/20 too hot to test 8/26: unable to walk outside due to heat and COVID    Time  8    Period  Weeks    Status  Achieved      PT LONG TERM GOAL #8   Title  Patient will negotiate tub with mod I to be able to bath/shower in bathroom requiring stability and ability to negotiate small area.     Baseline  12/29: unable to step over current step safely; has to sponge bath standing up in room 11/5: has to sponge bath standing up in room     Time  12    Period  Weeks    Status  On-going    Target Date  07/29/19            Plan - 05/06/19 1212    Clinical Impression Statement  Patient is demonstrating progress towards functional goals. Due to progressive steady progress over this year patient would benefit from increasing duration between recerts so increasing cert from 8 weeks to 12 weeks will benefit and reflect patients current progression. Patient is now able to lift her LE's more with decreasing reliance upon LE's resulting in improved mobilty and ambulation. Patient's condition has the potential to improve in response to therapy. Maximum improvement is yet to be obtained. The anticipated improvement is attainable and reasonable in a generally predictable time.The patient will continue to benefit from skilled therapeutic intervention to address deficits in strength, mobility, balance, and function for improved overall QOL and safety    Rehab Potential  Good    PT Frequency  2x / week    PT Duration  12 weeks    PT Treatment/Interventions  ADLs/Self Care Home Management;Aquatic Therapy;Biofeedback;Cryotherapy;Electrical Stimulation;Iontophoresis 69m/ml Dexamethasone;Traction;Moist Heat;Ultrasound;DME  Instruction;Gait training;Stair training;Functional mobility training;Therapeutic activities;Therapeutic exercise;Balance training;Neuromuscular re-education;Patient/family education;Orthotic Fit/Training;Wheelchair mobility training;Manual techniques;Compression bandaging;Passive range of motion;Dry needling;Energy conservation;Taping;Splinting    PT Next Visit Plan  strength/balance    PT Home Exercise Plan  to initiate next session (please incorporate hip flexor and adductor stretches in HEP if possible)    Consulted and Agree with Plan of Care  Patient       Patient will benefit from skilled therapeutic intervention in order to improve the following deficits and impairments:  Abnormal gait, Decreased activity tolerance, Decreased balance, Decreased coordination, Decreased endurance, Decreased knowledge of use of DME, Decreased mobility, Decreased range of motion, Decreased safety awareness, Decreased strength, Difficulty walking, Impaired perceived functional ability, Impaired sensation, Impaired tone, Impaired UE functional use, Improper body mechanics, Postural dysfunction  Visit Diagnosis: Muscle weakness (generalized)  Other abnormalities of gait and mobility  Unsteadiness on feet     Problem List Patient Active Problem List   Diagnosis Date Noted  . Abscess of female pelvis   . SVT (supraventricular tachycardia) ([place])   . Radial styloid tenosynovitis 03/12/2018  . Wheelchair confinement 02/27/2018  . Localized osteoporosis with current pathological fracture with routine healing 01/19/2017  . Wrist fracture 01/16/2017  . Sprain of ankle 03/23/2016  . Closed fracture of lateral malleolus 03/16/2016  . Health care maintenance 01/24/2016  . Blood pressure elevated without history of HTN 10/25/2015  . Essential hypertension 10/25/2015  . Multiple sclerosis ([place]) 10/02/2015  . Chronic left shoulder pain 07/19/2015  . Multiple sclerosis exacerbation ([place]) 07/14/2015  . MS  (multiple sclerosis) ([place]) 11/26/2014  . Increased body mass index 11/26/2014  . HPV test positive 11/26/2014  . Status post laparoscopic supracervical hysterectomy 11/26/2014  . Galactorrhea 11/26/2014  . Back ache 05/21/2014  . Adiposity 05/21/2014  . Disordered sleep 05/21/2014  . Muscle spasticity 05/21/2014  . Spasticity 05/21/2014  . Calculus of kidney 12/09/2013  . Renal colic 14/44/5848  . Hypercholesteremia 08/19/2013  . Hereditary and idiopathic neuropathy 08/19/2013  . Hypercholesterolemia without hypertriglyceridemia 08/19/2013  . Bladder infection, chronic 07/25/2012  . Disorder of bladder function 07/25/2012  . Incomplete bladder emptying 07/25/2012  . Microscopic hematuria 07/25/2012  . Right upper quadrant pain 07/25/2012   Janna Arch, PT, DPT   05/06/2019, 12:17 PM  [place] [facility] SERVICES [address] [place], [place], [address] Phone: [phone]   Fax:  [phone]  Name: Lisa Williams MRN: [id number] Date of Birth: 20-Apr-1961

## 2019-05-08 ENCOUNTER — Other Ambulatory Visit: Payer: Self-pay

## 2019-05-08 ENCOUNTER — Ambulatory Visit: Payer: Medicare HMO

## 2019-05-08 DIAGNOSIS — M6281 Muscle weakness (generalized): Secondary | ICD-10-CM

## 2019-05-08 DIAGNOSIS — R2689 Other abnormalities of gait and mobility: Secondary | ICD-10-CM

## 2019-05-08 DIAGNOSIS — R2681 Unsteadiness on feet: Secondary | ICD-10-CM

## 2019-05-08 NOTE — Therapy (Signed)
[place] MAIN [facility] SERVICES [address] [place], [place], [address] Phone: [phone]   Fax:  [phone]  Physical Therapy Treatment  Patient Details  Name: Lisa Williams MRN: [id number] Date of Birth: 1961-03-08 Referring Provider (PT): Gurney Maxin, MD   Encounter Date: 05/08/2019  PT End of Session - 05/08/19 1605    Visit Number  [id number]    Number of Visits  164    Date for PT Re-Evaluation  07/29/19    Authorization Type  1/10 PN 05/06/19    PT Start Time  1030    PT Stop Time  1112    PT Time Calculation (min)  42 min    Equipment Utilized During Treatment  Gait belt    Activity Tolerance  Patient tolerated treatment well   heat intolerance   Behavior During Therapy  [facility] for tasks assessed/performed       Past Medical History:  Diagnosis Date  . Abdominal pain, right upper quadrant   . Back pain   . Calculus of kidney 12/09/2013  . Chronic back pain    unspecified  . Chronic left shoulder pain 07/19/2015  . Functional disorder of bladder    other  . Galactorrhea 11/26/2014   Chronic   . Hereditary and idiopathic neuropathy 08/19/2013  . HPV test positive   . Hypercholesteremia 08/19/2013  . Incomplete bladder emptying   . Microscopic hematuria   . MS (multiple sclerosis) ([place])   . Muscle spasticity 05/21/2014  . Nonspecific findings on examination of urine    other  . Osteopenia   . Status post laparoscopic supracervical hysterectomy 11/26/2014  . Tobacco user 11/26/2014  . Wrist fracture     Past Surgical History:  Procedure Laterality Date  . bilateral tubal ligation  1996  . BREAST CYST EXCISION Left 2002  . KNEE SURGERY     right  . LAPAROSCOPIC SUPRACERVICAL HYSTERECTOMY  08/05/2013  . ORIF WRIST FRACTURE Left 01/17/2017   Procedure: OPEN REDUCTION INTERNAL FIXATION (ORIF) WRIST FRACTURE;  Surgeon: Lovell Sheehan, MD;  Location: [facility] ORS;  Service: Orthopedics;  Laterality: Left;  . TUBAL LIGATION  Bilateral   . VAGINAL HYSTERECTOMY  03/2006    There were no vitals filed for this visit.  Subjective Assessment - 05/08/19 1604    Subjective  Patient reports feeling a bit stiff this morning, no falls or LOB, no pain just stiffness.    Pertinent History  Pt is a 58 y/o F who presents with BLE weakness and difficulty ambulating due to h/o MS that was diagnosed in 1995. Pt denies any pain associated. Pt was admitted to the hospital in November 2018 due to MS exacerbation. Nov-Dec to HHPT-->[facility] until end of Jan. After rehab the pt was able to ambulate up to 100 ft with the RW. Pt has Bil AFO, her therapist at [place] thinks she needs a new AFO as her feet still drag when she ambulates, pt reports HHPT mentioned a spring AFO. Pt has custom AFOs that were originally made 4 years ago. Goals: improve balance, to be able to get into the shower with her tub bench without assist, to be able to get up 2 steps at sister's home with railings on both sides but too far apart to reach both sides, to be able to drive adapted car. Pt is staying at other sister's house with one step to get inside and currently picking up L leg with UEs and then stepping in. Pt  steps down leading with LLE. Pt wants to work on walking side to side as this is challenging and she had not yet progressed to this at rehab. Pt able to get in/out the car with assist only to bring BLEs into the car. Hoping to drive by June or July with adapted car (already has one). Pt needs assist bringing LEs into and out of hospital bed. Pt able to perform sit>stand without assist from [facility] with a pillow on the seat for a boost. Pt denies any falls in the past 6 months. Pt has been increasing her time alone at home up to a few hours at a time to become more independent. Pt has both manual and power WC. Uses power WC at home, uses manual WC out in community. Pt able to self propel manual WC. Pt needs assist putting pants around ankles but she can pull  them up. Pt can put on her shoes but needs help getting her leg up to do so. Pt able to tie her shoelaces on her own. Pt has a tub/shower unit at home with a tub bench. No grab bars in the shower. Has hand held shower head. Pt currently taking a sponge bath. Pt has a regular height toilet with a BSC over top. Pt with h/o L wrist fx with no precautions, pt wore her soft brace to evaluation for comfort but reports her wrist has healed and is doing well following OT.     Limitations  Lifting;Standing;Walking;House hold activities    How long can you sit comfortably?  n/a    How long can you stand comfortably?  3 minutes without UE assist, limited by fatigue    How long can you walk comfortably?  100 ft, limited by fatigue    Currently in Pain?  No/denies              Standing in // bars:  weight shift left and right with focus on hip shift rather than pulling with UE's 15x. Knee flexion hitting band across // bars 10x each LE; cueing for "knee to sky for clearance"  Standing abduction facing forwards 8x each  large step, in semi tandem stance let go with one hand, 5 seconds, 6x each LE, progressed to no hand holding once in semi tandem stance.  Standing interventions require seated icing breaks for reducton of internal body temperature between switching of limbs due to fatigue.     Seated; Isometrics: -hip flexion into PT hands 12x 3 second holds each LE, single LE at a time -hip abduction into PT hands 12x 3 second holds  -hip adduction into PT hands 12x 3 second holds  5lb bar: -rows 15x with cueing for body mechanics and keeping shoulders retracted -single arm pullback/row 12x each UE -straight arm raise with back unsupported 10x -chest press 12x with no back support  -circles clockwise 10x, counterclockwise 10x     Seated rest breaks and use of ice pack utilized to keep body temperature at functional range to reduce MS exacerbation.  Pt educated throughout session  about proper posture and technique with exercises. Improved exercise technique, movement at target joints, use of target muscles after min to mod verbal, visual, tactile cues.      PT Education - 05/08/19 1604    Education provided  Yes    Education Details  exercise technique, body mechanics    Person(s) Educated  Patient    Methods  Explanation;Demonstration;Tactile cues;Verbal cues    Comprehension  Verbalized  understanding;Returned demonstration;Verbal cues required;Tactile cues required       PT Short Term Goals - 03/13/19 1733      PT SHORT TERM GOAL #1   Title  Patient will be compliant with HEP 4-5 days/week for improved carryover between sessions.     Baseline  HEP compliancy between sessions 6/17 compliant    Time  2    Period  Weeks    Status  Achieved    Target Date  10/15/18      PT SHORT TERM GOAL #2   Title  Patient will report no falls/LOB to indicate improved stability with mobility     Baseline  6/17: no falls, one episode of instability 7/20: no falls    Time  2    Period  Weeks    Status  Achieved    Target Date  11/06/18        PT Long Term Goals - 05/06/19 0001      PT LONG TERM GOAL #1   Title  Patient will perform TUG in 40 seconds or less with walker to improve safe negotiation of environment.     Baseline  12/29: 41 seconds with RW 11/5: 45 seconds 10/9: deferred due to pain 5/26: 61 seconds 6/17: 60 seconds 7/20: 59 seconds 8/26:  55 seconds    Time  12    Period  Weeks    Status  Partially Met    Target Date  07/29/19      PT LONG TERM GOAL #2   Title  Pt's 82mT will improve to at least 0.5 m/s for improved safety ambulating in home    Baseline  12/29: 0.31 m/s 03/13/19: 0.30 m/s 0.14 m/s; 5/15: .259m  7/10: .36 m/s  8/20: .42 m/s  9/26: 0.2630m10/14: .26 m/s improved hip and knee clearance 11/15: 0.29m80mmproved hip and knee clearance; 12/5: .29 m/s improved step length and forward moment: decreased foot drag. 05/09/2018: 0.21 m/s 2/6:  .237 m/s 5/26: .23 m/s 6/17: .24 m/s  7/20: 0.26 m/s 8/26: 0.26 m/s 10/8: 0.28 m/s    Time  12    Period  Weeks    Status  Partially Met    Target Date  07/29/19      PT LONG TERM GOAL #3   Title  Pt's Bil hip F strength will improve to at least 2+/5 BLEs for improved gait mechanics and safety    Baseline  12/29:  Hips 2+/5 abduction/adduction, flexion 2/5  Knees 3+/5  11/5: Hips: 2/5 knees 3+/5 10/8: deferred due to pain L: 2/5, R: 1/5 7/10: 2/5 bilaterally  8/20: 2/5 bilat 9/26: 2/5 bilat 10/14: 2/5 bilaterally ,improved hip flexor muscle activation 2/5: improved hip flexor and quad activation: 12/5: 2/5 ; 05/09/2018: 2/5 2/6: 3/5 knee, 2 to 2+/5 hip 5/26: 2-/5 hips 3/5 knees 6/17: 2-/5 hips 3/5 knees    Time  12    Period  Weeks    Status  Partially Met    Target Date  07/29/19      PT LONG TERM GOAL #4   Title  Pt's ABC Scale will improve to at least 40% to demonstrate improved balance    Baseline  12/29: 34.7% 11/5: 32.2% 10/9: 32.1% 28.13% 5/15; 24.4% 7/10: 23.4% 8/20: 22. 2% 9/26: 25.3% 10/14: 26.5% 11/15: 24.38% 12/5: 26%; 05/09/2018: 29% 2/6: 23.75% 526: 30% 6/17: 26.8%  7/2: 28% 8/26: 30.3%    Time  12    Period  Weeks  Status  Partially Met    Target Date  07/29/19      PT LONG TERM GOAL #5   Title  Patient will negotiate one step ~4 inches to enter house independently to increase functional independence in natural environment    Baseline  12/29: 1.9 inch lift after multiple attempts11/5: able to lift 1.75 inch 10/9: has lifted 1.5 inch in previous sessions /20: unable to do 9/26: patient too fatigued/not feeling well to perform 10/14: unable to perform at this time 11/15: unable to perform at this time; 12/5: able to lift 1 inch; 05/09/2018: unable to clear 4" step, able to lift foot ~ 1in 5/26: has been unable to perform 6/17: able to lift foot 1.25 inch 8/26: has been able to step up onto 1.5 inch step in previous sessions    Time  12    Period  Weeks    Status  Partially Met     Target Date  07/29/19      PT LONG TERM GOAL #6   Title  Patient will negotiate 30 ft of changing surfaces outside such as pavement and parking lot to allow patient to walk into church.    Baseline  12/29: ambulate to cart for stores, too cold to perform for PT this session but has performed multiple times at home11/5; walks outside to cart at store ~20 ft 10/9: walks 20 ft outside at store now 12/5: does not walk outside 2/24: unable to test due to rain 5/26: unable since COVID 6/17: raining 7/20 too hot to test 8/26: unable to walk outside due to heat and COVID    Time  8    Period  Weeks    Status  Achieved      PT LONG TERM GOAL #8   Title  Patient will negotiate tub with mod I to be able to bath/shower in bathroom requiring stability and ability to negotiate small area.     Baseline  12/29: unable to step over current step safely; has to sponge bath standing up in room 11/5: has to sponge bath standing up in room     Time  12    Period  Weeks    Status  On-going    Target Date  07/29/19            Plan - 05/08/19 1610    Clinical Impression Statement  Patient is progressing with functional stability with decreased assistance from UE's once positioned in standing, she continues to require UE support for LE movement in standing. Patient continues to progress with functional core and stability in seated position with challenge to prolonged muscle recruitment patterns. The patient will continue to benefit from skilled therapeutic intervention to address deficits in strength, mobility, balance, and function for improved overall QOL and safety    Rehab Potential  Good    PT Frequency  2x / week    PT Duration  12 weeks    PT Treatment/Interventions  ADLs/Self Care Home Management;Aquatic Therapy;Biofeedback;Cryotherapy;Electrical Stimulation;Iontophoresis 51m/ml Dexamethasone;Traction;Moist Heat;Ultrasound;DME Instruction;Gait training;Stair training;Functional mobility  training;Therapeutic activities;Therapeutic exercise;Balance training;Neuromuscular re-education;Patient/family education;Orthotic Fit/Training;Wheelchair mobility training;Manual techniques;Compression bandaging;Passive range of motion;Dry needling;Energy conservation;Taping;Splinting    PT Next Visit Plan  strength/balance    PT Home Exercise Plan  to initiate next session (please incorporate hip flexor and adductor stretches in HEP if possible)    Consulted and Agree with Plan of Care  Patient       Patient will benefit from skilled therapeutic intervention in order to  improve the following deficits and impairments:  Abnormal gait, Decreased activity tolerance, Decreased balance, Decreased coordination, Decreased endurance, Decreased knowledge of use of DME, Decreased mobility, Decreased range of motion, Decreased safety awareness, Decreased strength, Difficulty walking, Impaired perceived functional ability, Impaired sensation, Impaired tone, Impaired UE functional use, Improper body mechanics, Postural dysfunction  Visit Diagnosis: Muscle weakness (generalized)  Other abnormalities of gait and mobility  Unsteadiness on feet     Problem List Patient Active Problem List   Diagnosis Date Noted  . Abscess of female pelvis   . SVT (supraventricular tachycardia) ([place])   . Radial styloid tenosynovitis 03/12/2018  . Wheelchair confinement 02/27/2018  . Localized osteoporosis with current pathological fracture with routine healing 01/19/2017  . Wrist fracture 01/16/2017  . Sprain of ankle 03/23/2016  . Closed fracture of lateral malleolus 03/16/2016  . Health care maintenance 01/24/2016  . Blood pressure elevated without history of HTN 10/25/2015  . Essential hypertension 10/25/2015  . Multiple sclerosis ([place]) 10/02/2015  . Chronic left shoulder pain 07/19/2015  . Multiple sclerosis exacerbation ([place]) 07/14/2015  . MS (multiple sclerosis) ([place]) 11/26/2014  . Increased body mass index  11/26/2014  . HPV test positive 11/26/2014  . Status post laparoscopic supracervical hysterectomy 11/26/2014  . Galactorrhea 11/26/2014  . Back ache 05/21/2014  . Adiposity 05/21/2014  . Disordered sleep 05/21/2014  . Muscle spasticity 05/21/2014  . Spasticity 05/21/2014  . Calculus of kidney 12/09/2013  . Renal colic 61/48/3073  . Hypercholesteremia 08/19/2013  . Hereditary and idiopathic neuropathy 08/19/2013  . Hypercholesterolemia without hypertriglyceridemia 08/19/2013  . Bladder infection, chronic 07/25/2012  . Disorder of bladder function 07/25/2012  . Incomplete bladder emptying 07/25/2012  . Microscopic hematuria 07/25/2012  . Right upper quadrant pain 07/25/2012   Janna Arch, PT, DPT   05/08/2019, 4:12 PM  [place] MAIN [facility] SERVICES [address] [place], [place], [address] Phone: [phone]   Fax:  [phone]  Name: Lisa Williams MRN: [id number] Date of Birth: [birth year]

## 2019-05-13 ENCOUNTER — Ambulatory Visit: Payer: PPO | Attending: Neurology

## 2019-05-13 ENCOUNTER — Other Ambulatory Visit: Payer: Self-pay

## 2019-05-13 DIAGNOSIS — R2689 Other abnormalities of gait and mobility: Secondary | ICD-10-CM | POA: Diagnosis not present

## 2019-05-13 DIAGNOSIS — M5412 Radiculopathy, cervical region: Secondary | ICD-10-CM | POA: Diagnosis not present

## 2019-05-13 DIAGNOSIS — R2681 Unsteadiness on feet: Secondary | ICD-10-CM | POA: Diagnosis not present

## 2019-05-13 DIAGNOSIS — M6281 Muscle weakness (generalized): Secondary | ICD-10-CM

## 2019-05-13 NOTE — Therapy (Signed)
[place] MAIN [facility] SERVICES [address] [place], [place], [address] Phone: [phone]   Fax:  [phone]  Physical Therapy Treatment  Patient Details  Name: Lisa Williams MRN: [id number] Date of Birth: [birth year] No data recorded  Encounter Date: 05/13/2019  PT End of Session - 05/13/19 1755    Visit Number  [id number]    Number of Visits  164    Date for PT Re-Evaluation  07/29/19    Authorization Type  2/10 PN 05/06/19    PT Start Time  1030    PT Stop Time  1113    PT Time Calculation (min)  43 min    Equipment Utilized During Treatment  Gait belt    Activity Tolerance  Patient tolerated treatment well   heat intolerance   Behavior During Therapy  WFL for tasks assessed/performed       Past Medical History:  Diagnosis Date  . Abdominal pain, right upper quadrant   . Back pain   . Calculus of kidney 12/09/2013  . Chronic back pain    unspecified  . Chronic left shoulder pain 07/19/2015  . Functional disorder of bladder    other  . Galactorrhea 11/26/2014   Chronic   . Hereditary and idiopathic neuropathy 08/19/2013  . HPV test positive   . Hypercholesteremia 08/19/2013  . Incomplete bladder emptying   . Microscopic hematuria   . MS (multiple sclerosis) ([place])   . Muscle spasticity 05/21/2014  . Nonspecific findings on examination of urine    other  . Osteopenia   . Status post laparoscopic supracervical hysterectomy 11/26/2014  . Tobacco user 11/26/2014  . Wrist fracture     Past Surgical History:  Procedure Laterality Date  . bilateral tubal ligation  1996  . BREAST CYST EXCISION Left 2002  . KNEE SURGERY     right  . LAPAROSCOPIC SUPRACERVICAL HYSTERECTOMY  08/05/2013  . ORIF WRIST FRACTURE Left 01/17/2017   Procedure: OPEN REDUCTION INTERNAL FIXATION (ORIF) WRIST FRACTURE;  Surgeon: Lovell Sheehan, MD;  Location: [facility] ORS;  Service: Orthopedics;  Laterality: Left;  . TUBAL LIGATION Bilateral   . VAGINAL  HYSTERECTOMY  03/2006    There were no vitals filed for this visit.  Subjective Assessment - 05/13/19 1755    Subjective  Patient reports compliance with HEP, no falls or LOB since last session.    Pertinent History  Pt is a 59 y/o F who presents with BLE weakness and difficulty ambulating due to h/o MS that was diagnosed in 1995. Pt denies any pain associated. Pt was admitted to the hospital in November 2018 due to MS exacerbation. Nov-Dec to HHPT-->[facility] until end of Jan. After rehab the pt was able to ambulate up to 100 ft with the RW. Pt has Bil AFO, her therapist at [place] thinks she needs a new AFO as her feet still drag when she ambulates, pt reports HHPT mentioned a spring AFO. Pt has custom AFOs that were originally made 4 years ago. Goals: improve balance, to be able to get into the shower with her tub bench without assist, to be able to get up 2 steps at sister's home with railings on both sides but too far apart to reach both sides, to be able to drive adapted car. Pt is staying at other sister's house with one step to get inside and currently picking up L leg with UEs and then stepping in. Pt steps down leading with LLE. Pt wants to  work on walking side to side as this is challenging and she had not yet progressed to this at rehab. Pt able to get in/out the car with assist only to bring BLEs into the car. Hoping to drive by June or July with adapted car (already has one). Pt needs assist bringing LEs into and out of hospital bed. Pt able to perform sit>stand without assist from [facility] with a pillow on the seat for a boost. Pt denies any falls in the past 6 months. Pt has been increasing her time alone at home up to a few hours at a time to become more independent. Pt has both manual and power WC. Uses power WC at home, uses manual WC out in community. Pt able to self propel manual WC. Pt needs assist putting pants around ankles but she can pull them up. Pt can put on her shoes but needs  help getting her leg up to do so. Pt able to tie her shoelaces on her own. Pt has a tub/shower unit at home with a tub bench. No grab bars in the shower. Has hand held shower head. Pt currently taking a sponge bath. Pt has a regular height toilet with a BSC over top. Pt with h/o L wrist fx with no precautions, pt wore her soft brace to evaluation for comfort but reports her wrist has healed and is doing well following OT.     Limitations  Lifting;Standing;Walking;House hold activities    How long can you sit comfortably?  n/a    How long can you stand comfortably?  3 minutes without UE assist, limited by fatigue    How long can you walk comfortably?  100 ft, limited by fatigue    Currently in Pain?  No/denies             Standing in // bars:  weight shiftleft and right with focus on hip shift rather than pulling with UE's 15x. Knee flexion hitting band across // bars 10x each LE; cueing for "knee to sky for clearance"  Standing hip extension 8x each Le large step, in semi tandem stance, 10-15 seconds no UE support  3x each LE,  once in semi tandem stance.  Standing interventions require seated icing breaks for reducton of internal body temperature between switching of limbs due to fatigue.    Seated; Isometrics: -hip flexion into PT hands 12x 3 second holds each LE, single LE at a time weighted ball (2000 gr) -chest press 15x -straight arm overhead raises 15x -scapular retractions 15x     Seated rest breaks and use of ice pack utilized to keep body temperature at functional range to reduce MS exacerbation.  Pt educated throughout session about proper posture and technique with exercises. Improved exercise technique, movement at target joints, use of target muscles after min to mod verbal, visual, tactile cues.   Patient presents to PT with excellent motivation. She is progressing in standing stability and mobility with increased tolerance in tandem stance  position. Increased clearance of LE's with knee flexion. The patient will continue to benefit from skilled therapeutic intervention to address deficits in strength, mobility, balance, and function for improved overall QOL and safety            PT Education - 05/13/19 1755    Education provided  Yes    Education Details  exercise technique, body mechanics    Person(s) Educated  Patient    Methods  Explanation;Demonstration;Tactile cues;Verbal cues    Comprehension  Verbalized understanding;Returned demonstration;Verbal cues required;Tactile cues required       PT Short Term Goals - 03/13/19 1733      PT SHORT TERM GOAL #1   Title  Patient will be compliant with HEP 4-5 days/week for improved carryover between sessions.     Baseline  HEP compliancy between sessions 6/17 compliant    Time  2    Period  Weeks    Status  Achieved    Target Date  10/15/18      PT SHORT TERM GOAL #2   Title  Patient will report no falls/LOB to indicate improved stability with mobility     Baseline  6/17: no falls, one episode of instability 7/20: no falls    Time  2    Period  Weeks    Status  Achieved    Target Date  11/06/18        PT Long Term Goals - 05/06/19 0001      PT LONG TERM GOAL #1   Title  Patient will perform TUG in 40 seconds or less with walker to improve safe negotiation of environment.     Baseline  12/29: 41 seconds with RW 11/5: 45 seconds 10/9: deferred due to pain 5/26: 61 seconds 6/17: 60 seconds 7/20: 59 seconds 8/26:  55 seconds    Time  12    Period  Weeks    Status  Partially Met    Target Date  07/29/19      PT LONG TERM GOAL #2   Title  Pt's 66mT will improve to at least 0.5 m/s for improved safety ambulating in home    Baseline  12/29: 0.31 m/s 03/13/19: 0.30 m/s 0.14 m/s; 5/15: .279m  7/10: .36 m/s  8/20: .42 m/s  9/26: 0.2613m10/14: .26 m/s improved hip and knee clearance 11/15: 0.61m49mmproved hip and knee clearance; 12/5: .29 m/s improved step length  and forward moment: decreased foot drag. 05/09/2018: 0.21 m/s 2/6: .237 m/s 5/26: .23 m/s 6/17: .24 m/s  7/20: 0.26 m/s 8/26: 0.26 m/s 10/8: 0.28 m/s    Time  12    Period  Weeks    Status  Partially Met    Target Date  07/29/19      PT LONG TERM GOAL #3   Title  Pt's Bil hip F strength will improve to at least 2+/5 BLEs for improved gait mechanics and safety    Baseline  12/29:  Hips 2+/5 abduction/adduction, flexion 2/5  Knees 3+/5  11/5: Hips: 2/5 knees 3+/5 10/8: deferred due to pain L: 2/5, R: 1/5 7/10: 2/5 bilaterally  8/20: 2/5 bilat 9/26: 2/5 bilat 10/14: 2/5 bilaterally ,improved hip flexor muscle activation 2/5: improved hip flexor and quad activation: 12/5: 2/5 ; 05/09/2018: 2/5 2/6: 3/5 knee, 2 to 2+/5 hip 5/26: 2-/5 hips 3/5 knees 6/17: 2-/5 hips 3/5 knees    Time  12    Period  Weeks    Status  Partially Met    Target Date  07/29/19      PT LONG TERM GOAL #4   Title  Pt's ABC Scale will improve to at least 40% to demonstrate improved balance    Baseline  12/29: 34.7% 11/5: 32.2% 10/9: 32.1% 28.13% 5/15; 24.4% 7/10: 23.4% 8/20: 22. 2% 9/26: 25.3% 10/14: 26.5% 11/15: 24.38% 12/5: 26%; 05/09/2018: 29% 2/6: 23.75% 526: 30% 6/17: 26.8%  7/2: 28% 8/26: 30.3%    Time  12    Period  Weeks  Status  Partially Met    Target Date  07/29/19      PT LONG TERM GOAL #5   Title  Patient will negotiate one step ~4 inches to enter house independently to increase functional independence in natural environment    Baseline  12/29: 1.9 inch lift after multiple attempts11/5: able to lift 1.75 inch 10/9: has lifted 1.5 inch in previous sessions /20: unable to do 9/26: patient too fatigued/not feeling well to perform 10/14: unable to perform at this time 11/15: unable to perform at this time; 12/5: able to lift 1 inch; 05/09/2018: unable to clear 4" step, able to lift foot ~ 1in 5/26: has been unable to perform 6/17: able to lift foot 1.25 inch 8/26: has been able to step up onto 1.5 inch step in previous  sessions    Time  12    Period  Weeks    Status  Partially Met    Target Date  07/29/19      PT LONG TERM GOAL #6   Title  Patient will negotiate 30 ft of changing surfaces outside such as pavement and parking lot to allow patient to walk into church.    Baseline  12/29: ambulate to cart for stores, too cold to perform for PT this session but has performed multiple times at home11/5; walks outside to cart at store ~20 ft 10/9: walks 20 ft outside at store now 12/5: does not walk outside 2/24: unable to test due to rain 5/26: unable since COVID 6/17: raining 7/20 too hot to test 8/26: unable to walk outside due to heat and COVID    Time  8    Period  Weeks    Status  Achieved      PT LONG TERM GOAL #8   Title  Patient will negotiate tub with mod I to be able to bath/shower in bathroom requiring stability and ability to negotiate small area.     Baseline  12/29: unable to step over current step safely; has to sponge bath standing up in room 11/5: has to sponge bath standing up in room     Time  12    Period  Weeks    Status  On-going    Target Date  07/29/19            Plan - 05/13/19 1808    Clinical Impression Statement  Patient presents to PT with excellent motivation. She is progressing in standing stability and mobility with increased tolerance in tandem stance position. Increased clearance of LE's with knee flexion. The patient will continue to benefit from skilled therapeutic intervention to address deficits in strength, mobility, balance, and function for improved overall QOL and safety    Rehab Potential  Good    PT Frequency  2x / week    PT Duration  12 weeks    PT Treatment/Interventions  ADLs/Self Care Home Management;Aquatic Therapy;Biofeedback;Cryotherapy;Electrical Stimulation;Iontophoresis '4mg'$ /ml Dexamethasone;Traction;Moist Heat;Ultrasound;DME Instruction;Gait training;Stair training;Functional mobility training;Therapeutic activities;Therapeutic exercise;Balance  training;Neuromuscular re-education;Patient/family education;Orthotic Fit/Training;Wheelchair mobility training;Manual techniques;Compression bandaging;Passive range of motion;Dry needling;Energy conservation;Taping;Splinting    PT Next Visit Plan  strength/balance    PT Home Exercise Plan  to initiate next session (please incorporate hip flexor and adductor stretches in HEP if possible)    Consulted and Agree with Plan of Care  Patient       Patient will benefit from skilled therapeutic intervention in order to improve the following deficits and impairments:  Abnormal gait, Decreased activity tolerance, Decreased balance, Decreased coordination,  Decreased endurance, Decreased knowledge of use of DME, Decreased mobility, Decreased range of motion, Decreased safety awareness, Decreased strength, Difficulty walking, Impaired perceived functional ability, Impaired sensation, Impaired tone, Impaired UE functional use, Improper body mechanics, Postural dysfunction  Visit Diagnosis: Muscle weakness (generalized)  Other abnormalities of gait and mobility  Unsteadiness on feet     Problem List Patient Active Problem List   Diagnosis Date Noted  . Abscess of female pelvis   . SVT (supraventricular tachycardia) ([place])   . Radial styloid tenosynovitis 03/12/2018  . Wheelchair confinement 02/27/2018  . Localized osteoporosis with current pathological fracture with routine healing 01/19/2017  . Wrist fracture 01/16/2017  . Sprain of ankle 03/23/2016  . Closed fracture of lateral malleolus 03/16/2016  . Health care maintenance 01/24/2016  . Blood pressure elevated without history of HTN 10/25/2015  . Essential hypertension 10/25/2015  . Multiple sclerosis ([place]) 10/02/2015  . Chronic left shoulder pain 07/19/2015  . Multiple sclerosis exacerbation ([place]) 07/14/2015  . MS (multiple sclerosis) ([place]) 11/26/2014  . Increased body mass index 11/26/2014  . HPV test positive 11/26/2014  . Status post  laparoscopic supracervical hysterectomy 11/26/2014  . Galactorrhea 11/26/2014  . Back ache 05/21/2014  . Adiposity 05/21/2014  . Disordered sleep 05/21/2014  . Muscle spasticity 05/21/2014  . Spasticity 05/21/2014  . Calculus of kidney 12/09/2013  . Renal colic 00/51/1021  . Hypercholesteremia 08/19/2013  . Hereditary and idiopathic neuropathy 08/19/2013  . Hypercholesterolemia without hypertriglyceridemia 08/19/2013  . Bladder infection, chronic 07/25/2012  . Disorder of bladder function 07/25/2012  . Incomplete bladder emptying 07/25/2012  . Microscopic hematuria 07/25/2012  . Right upper quadrant pain 07/25/2012   Janna Arch, PT, DPT   05/13/2019, 6:09 PM  [place] MAIN [facility] SERVICES [address] [place], [place], [address] Phone: [phone]   Fax:  [phone]  Name: Rami Waddle Shanholtzer MRN: [id number] Date of Birth: 09/18/1960

## 2019-05-15 ENCOUNTER — Ambulatory Visit: Payer: PPO

## 2019-05-15 ENCOUNTER — Other Ambulatory Visit: Payer: Self-pay

## 2019-05-15 DIAGNOSIS — M6281 Muscle weakness (generalized): Secondary | ICD-10-CM

## 2019-05-15 DIAGNOSIS — R2689 Other abnormalities of gait and mobility: Secondary | ICD-10-CM

## 2019-05-15 DIAGNOSIS — R2681 Unsteadiness on feet: Secondary | ICD-10-CM

## 2019-05-15 NOTE — Therapy (Signed)
[place] MAIN [facility] SERVICES [address] [place], [place], [address] Phone: [phone]   Fax:  [phone]  Physical Therapy Treatment  Patient Details  Name: Lisa Williams MRN: [id number] Date of Birth: 05-11-1960 No data recorded  Encounter Date: 05/15/2019  PT End of Session - 05/15/19 2031    Visit Number  [id number]    Number of Visits  164    Date for PT Re-Evaluation  07/29/19    Authorization Type  3/10 PN 05/06/19    PT Start Time  1031    PT Stop Time  1113    PT Time Calculation (min)  42 min    Equipment Utilized During Treatment  Gait belt    Activity Tolerance  Patient tolerated treatment well   heat intolerance   Behavior During Therapy  WFL for tasks assessed/performed       Past Medical History:  Diagnosis Date  . Abdominal pain, right upper quadrant   . Back pain   . Calculus of kidney 12/09/2013  . Chronic back pain    unspecified  . Chronic left shoulder pain 07/19/2015  . Functional disorder of bladder    other  . Galactorrhea 11/26/2014   Chronic   . Hereditary and idiopathic neuropathy 08/19/2013  . HPV test positive   . Hypercholesteremia 08/19/2013  . Incomplete bladder emptying   . Microscopic hematuria   . MS (multiple sclerosis) ([place])   . Muscle spasticity 05/21/2014  . Nonspecific findings on examination of urine    other  . Osteopenia   . Status post laparoscopic supracervical hysterectomy 11/26/2014  . Tobacco user 11/26/2014  . Wrist fracture     Past Surgical History:  Procedure Laterality Date  . bilateral tubal ligation  1996  . BREAST CYST EXCISION Left 2002  . KNEE SURGERY     right  . LAPAROSCOPIC SUPRACERVICAL HYSTERECTOMY  08/05/2013  . ORIF WRIST FRACTURE Left 01/17/2017   Procedure: OPEN REDUCTION INTERNAL FIXATION (ORIF) WRIST FRACTURE;  Surgeon: Lovell Sheehan, MD;  Location: [facility] ORS;  Service: Orthopedics;  Laterality: Left;  . TUBAL LIGATION Bilateral   . VAGINAL  HYSTERECTOMY  03/2006    There were no vitals filed for this visit.  Subjective Assessment - 05/15/19 2030    Subjective  Patient reports not sleeping much the day before but is feeling ok today. No falls or LOB since last session.    Pertinent History  Pt is a 59 y/o F who presents with BLE weakness and difficulty ambulating due to h/o MS that was diagnosed in 1995. Pt denies any pain associated. Pt was admitted to the hospital in November 2018 due to MS exacerbation. Nov-Dec to HHPT-->[facility] until end of Jan. After rehab the pt was able to ambulate up to 100 ft with the RW. Pt has Bil AFO, her therapist at [facility] thinks she needs a new AFO as her feet still drag when she ambulates, pt reports HHPT mentioned a spring AFO. Pt has custom AFOs that were originally made 4 years ago. Goals: improve balance, to be able to get into the shower with her tub bench without assist, to be able to get up 2 steps at sister's home with railings on both sides but too far apart to reach both sides, to be able to drive adapted car. Pt is staying at other sister's house with one step to get inside and currently picking up L leg with UEs and then stepping in. Pt  steps down leading with LLE. Pt wants to work on walking side to side as this is challenging and she had not yet progressed to this at rehab. Pt able to get in/out the car with assist only to bring BLEs into the car. Hoping to drive by June or July with adapted car (already has one). Pt needs assist bringing LEs into and out of hospital bed. Pt able to perform sit>stand without assist from [facility] with a pillow on the seat for a boost. Pt denies any falls in the past 6 months. Pt has been increasing her time alone at home up to a few hours at a time to become more independent. Pt has both manual and power WC. Uses power WC at home, uses manual WC out in community. Pt able to self propel manual WC. Pt needs assist putting pants around ankles but she can pull them  up. Pt can put on her shoes but needs help getting her leg up to do so. Pt able to tie her shoelaces on her own. Pt has a tub/shower unit at home with a tub bench. No grab bars in the shower. Has hand held shower head. Pt currently taking a sponge bath. Pt has a regular height toilet with a BSC over top. Pt with h/o L wrist fx with no precautions, pt wore her soft brace to evaluation for comfort but reports her wrist has healed and is doing well following OT.     Limitations  Lifting;Standing;Walking;House hold activities    How long can you sit comfortably?  n/a    How long can you stand comfortably?  3 minutes without UE assist, limited by fatigue    How long can you walk comfortably?  100 ft, limited by fatigue    Currently in Pain?  No/denies                Treatment: Standing with RW: weight shifts L, R with focus on hip shift rather than pulling with UE's 10x each side, challenge with stability due to wet shoes   Ambulate in carpet hallway with RW , CGA, and w/c follow. Cueing for shifting of weight and hip/knee flexion for foot clearance. Occasional shuffle stepping of LLE x 79 ft    Standing with RW: step over blue theraband on floor 8x each LE, good weight shift and step length ; seated rest break between LE;s   standing with RW: single UE raises opposite arms  Seated: Cross body jabs with increasing difficulty and range, increasing challenge to L, R, duck for core stability and postural reactions. -Jab L , R duck for cross body coordination and postural control/righting reactions 10x -Jab R, L duck for cross body coordination and postural control/righting reactions 10x -Cross body elbowfor abdominal strengthening and postural challenge x 60 seconds -upper cut for abdominal strengthening and postural challenge x 60 seconds  Arms at 90 hands together forward lean, bring arms out into abduction back in together and lean back, x 10         Seated rest breaks and use of  ice pack utilized to keep body temperature at functional range to reduce MS exacerbation.      Pt educated throughout session about proper posture and technique with exercises. Improved exercise technique, movement at target joints, use of target muscles after min to mod verbal, visual, tactile cues.                     PT Education -  05/15/19 2031    Education provided  Yes    Education Details  exercise technique, body mechanics    Person(s) Educated  Patient    Methods  Explanation;Demonstration;Tactile cues;Verbal cues    Comprehension  Verbalized understanding;Returned demonstration;Verbal cues required;Tactile cues required       PT Short Term Goals - 03/13/19 1733      PT SHORT TERM GOAL #1   Title  Patient will be compliant with HEP 4-5 days/week for improved carryover between sessions.     Baseline  HEP compliancy between sessions 6/17 compliant    Time  2    Period  Weeks    Status  Achieved    Target Date  10/15/18      PT SHORT TERM GOAL #2   Title  Patient will report no falls/LOB to indicate improved stability with mobility     Baseline  6/17: no falls, one episode of instability 7/20: no falls    Time  2    Period  Weeks    Status  Achieved    Target Date  11/06/18        PT Long Term Goals - 05/06/19 0001      PT LONG TERM GOAL #1   Title  Patient will perform TUG in 40 seconds or less with walker to improve safe negotiation of environment.     Baseline  12/29: 41 seconds with RW 11/5: 45 seconds 10/9: deferred due to pain 5/26: 61 seconds 6/17: 60 seconds 7/20: 59 seconds 8/26:  55 seconds    Time  12    Period  Weeks    Status  Partially Met    Target Date  07/29/19      PT LONG TERM GOAL #2   Title  Pt's 94mT will improve to at least 0.5 m/s for improved safety ambulating in home    Baseline  12/29: 0.31 m/s 03/13/19: 0.30 m/s 0.14 m/s; 5/15: .268m  7/10: .36 m/s  8/20: .42 m/s  9/26: 0.2653m10/14: .26 m/s improved hip and knee  clearance 11/15: 0.55m15mmproved hip and knee clearance; 12/5: .29 m/s improved step length and forward moment: decreased foot drag. 05/09/2018: 0.21 m/s 2/6: .237 m/s 5/26: .23 m/s 6/17: .24 m/s  7/20: 0.26 m/s 8/26: 0.26 m/s 10/8: 0.28 m/s    Time  12    Period  Weeks    Status  Partially Met    Target Date  07/29/19      PT LONG TERM GOAL #3   Title  Pt's Bil hip F strength will improve to at least 2+/5 BLEs for improved gait mechanics and safety    Baseline  12/29:  Hips 2+/5 abduction/adduction, flexion 2/5  Knees 3+/5  11/5: Hips: 2/5 knees 3+/5 10/8: deferred due to pain L: 2/5, R: 1/5 7/10: 2/5 bilaterally  8/20: 2/5 bilat 9/26: 2/5 bilat 10/14: 2/5 bilaterally ,improved hip flexor muscle activation 2/5: improved hip flexor and quad activation: 12/5: 2/5 ; 05/09/2018: 2/5 2/6: 3/5 knee, 2 to 2+/5 hip 5/26: 2-/5 hips 3/5 knees 6/17: 2-/5 hips 3/5 knees    Time  12    Period  Weeks    Status  Partially Met    Target Date  07/29/19      PT LONG TERM GOAL #4   Title  Pt's ABC Scale will improve to at least 40% to demonstrate improved balance    Baseline  12/29: 34.7% 11/5: 32.2% 10/9: 32.1% 28.13% 5/15; 24.4% 7/10:  23.4% 8/20: 22. 2% 9/26: 25.3% 10/14: 26.5% 11/15: 24.38% 12/5: 26%; 05/09/2018: 29% 2/6: 23.75% 526: 30% 6/17: 26.8%  7/2: 28% 8/26: 30.3%    Time  12    Period  Weeks    Status  Partially Met    Target Date  07/29/19      PT LONG TERM GOAL #5   Title  Patient will negotiate one step ~4 inches to enter house independently to increase functional independence in natural environment    Baseline  12/29: 1.9 inch lift after multiple attempts11/5: able to lift 1.75 inch 10/9: has lifted 1.5 inch in previous sessions /20: unable to do 9/26: patient too fatigued/not feeling well to perform 10/14: unable to perform at this time 11/15: unable to perform at this time; 12/5: able to lift 1 inch; 05/09/2018: unable to clear 4" step, able to lift foot ~ 1in 5/26: has been unable to perform 6/17:  able to lift foot 1.25 inch 8/26: has been able to step up onto 1.5 inch step in previous sessions    Time  12    Period  Weeks    Status  Partially Met    Target Date  07/29/19      PT LONG TERM GOAL #6   Title  Patient will negotiate 30 ft of changing surfaces outside such as pavement and parking lot to allow patient to walk into church.    Baseline  12/29: ambulate to cart for stores, too cold to perform for PT this session but has performed multiple times at home11/5; walks outside to cart at store ~20 ft 10/9: walks 20 ft outside at store now 12/5: does not walk outside 2/24: unable to test due to rain 5/26: unable since COVID 6/17: raining 7/20 too hot to test 8/26: unable to walk outside due to heat and COVID    Time  8    Period  Weeks    Status  Achieved      PT LONG TERM GOAL #8   Title  Patient will negotiate tub with mod I to be able to bath/shower in bathroom requiring stability and ability to negotiate small area.     Baseline  12/29: unable to step over current step safely; has to sponge bath standing up in room 11/5: has to sponge bath standing up in room     Time  12    Period  Weeks    Status  On-going    Target Date  07/29/19            Plan - 05/15/19 2034    Clinical Impression Statement  Patient demonstrated continued progression of capacity for functional ambulation with prolonged mobility and no resting breaks. She continues to have decreased episodes of L foot drag Core and trunk stability continue to be challenged by power movements allowing for continued focus of progression for stability and carryover to functional mobility and transfers. The patient will continue to benefit from skilled therapeutic intervention to address deficits in strength, mobility, balance, and function for improved overall QOL and safety    Rehab Potential  Good    PT Frequency  2x / week    PT Duration  12 weeks    PT Treatment/Interventions  ADLs/Self Care Home Management;Aquatic  Therapy;Biofeedback;Cryotherapy;Electrical Stimulation;Iontophoresis 46m/ml Dexamethasone;Traction;Moist Heat;Ultrasound;DME Instruction;Gait training;Stair training;Functional mobility training;Therapeutic activities;Therapeutic exercise;Balance training;Neuromuscular re-education;Patient/family education;Orthotic Fit/Training;Wheelchair mobility training;Manual techniques;Compression bandaging;Passive range of motion;Dry needling;Energy conservation;Taping;Splinting    PT Next Visit Plan  strength/balance    PT  Home Exercise Plan  to initiate next session (please incorporate hip flexor and adductor stretches in HEP if possible)    Consulted and Agree with Plan of Care  Patient       Patient will benefit from skilled therapeutic intervention in order to improve the following deficits and impairments:  Abnormal gait, Decreased activity tolerance, Decreased balance, Decreased coordination, Decreased endurance, Decreased knowledge of use of DME, Decreased mobility, Decreased range of motion, Decreased safety awareness, Decreased strength, Difficulty walking, Impaired perceived functional ability, Impaired sensation, Impaired tone, Impaired UE functional use, Improper body mechanics, Postural dysfunction  Visit Diagnosis: Muscle weakness (generalized)  Other abnormalities of gait and mobility  Unsteadiness on feet     Problem List Patient Active Problem List   Diagnosis Date Noted  . Abscess of female pelvis   . SVT (supraventricular tachycardia) ([place])   . Radial styloid tenosynovitis 03/12/2018  . Wheelchair confinement 02/27/2018  . Localized osteoporosis with current pathological fracture with routine healing 01/19/2017  . Wrist fracture 01/16/2017  . Sprain of ankle 03/23/2016  . Closed fracture of lateral malleolus 03/16/2016  . Health care maintenance 01/24/2016  . Blood pressure elevated without history of HTN 10/25/2015  . Essential hypertension 10/25/2015  . Multiple sclerosis  ([place]) 10/02/2015  . Chronic left shoulder pain 07/19/2015  . Multiple sclerosis exacerbation ([place]) 07/14/2015  . MS (multiple sclerosis) ([place]) 11/26/2014  . Increased body mass index 11/26/2014  . HPV test positive 11/26/2014  . Status post laparoscopic supracervical hysterectomy 11/26/2014  . Galactorrhea 11/26/2014  . Back ache 05/21/2014  . Adiposity 05/21/2014  . Disordered sleep 05/21/2014  . Muscle spasticity 05/21/2014  . Spasticity 05/21/2014  . Calculus of kidney 12/09/2013  . Renal colic 62/83/1517  . Hypercholesteremia 08/19/2013  . Hereditary and idiopathic neuropathy 08/19/2013  . Hypercholesterolemia without hypertriglyceridemia 08/19/2013  . Bladder infection, chronic 07/25/2012  . Disorder of bladder function 07/25/2012  . Incomplete bladder emptying 07/25/2012  . Microscopic hematuria 07/25/2012  . Right upper quadrant pain 07/25/2012   Janna Arch, PT, DPT   05/15/2019, 8:39 PM  [place] MAIN [facility] SERVICES [address] [place], [place], [address] Phone: [phone]   Fax:  [phone]  Name: Rheya [place] MRN: [id number] Date of Birth: [birth year]

## 2019-05-17 ENCOUNTER — Other Ambulatory Visit: Payer: Self-pay | Admitting: Urology

## 2019-05-20 ENCOUNTER — Other Ambulatory Visit: Payer: Self-pay

## 2019-05-20 ENCOUNTER — Ambulatory Visit: Payer: PPO

## 2019-05-20 DIAGNOSIS — M6281 Muscle weakness (generalized): Secondary | ICD-10-CM | POA: Diagnosis not present

## 2019-05-20 DIAGNOSIS — R2689 Other abnormalities of gait and mobility: Secondary | ICD-10-CM

## 2019-05-20 DIAGNOSIS — R2681 Unsteadiness on feet: Secondary | ICD-10-CM

## 2019-05-20 NOTE — Therapy (Signed)
[place] MAIN [facility] SERVICES [address] [place], [place], [address] Phone: [phone]   Fax:  [phone]  Physical Therapy Treatment  Patient Details  Name: Lisa Williams MRN: [id number] Date of Birth: Dec 24, 1960 No data recorded  Encounter Date: 05/20/2019  PT End of Session - 05/20/19 1310    Visit Number  [id number]    Number of Visits  164    Date for PT Re-Evaluation  07/29/19    Authorization Type  4/10 PN 05/06/19    PT Start Time  1030    PT Stop Time  1114    PT Time Calculation (min)  44 min    Equipment Utilized During Treatment  Gait belt    Activity Tolerance  Patient tolerated treatment well   heat intolerance   Behavior During Therapy  WFL for tasks assessed/performed       Past Medical History:  Diagnosis Date  . Abdominal pain, right upper quadrant   . Back pain   . Calculus of kidney 12/09/2013  . Chronic back pain    unspecified  . Chronic left shoulder pain 07/19/2015  . Functional disorder of bladder    other  . Galactorrhea 11/26/2014   Chronic   . Hereditary and idiopathic neuropathy 08/19/2013  . HPV test positive   . Hypercholesteremia 08/19/2013  . Incomplete bladder emptying   . Microscopic hematuria   . MS (multiple sclerosis) ([place])   . Muscle spasticity 05/21/2014  . Nonspecific findings on examination of urine    other  . Osteopenia   . Status post laparoscopic supracervical hysterectomy 11/26/2014  . Tobacco user 11/26/2014  . Wrist fracture     Past Surgical History:  Procedure Laterality Date  . bilateral tubal ligation  1996  . BREAST CYST EXCISION Left 2002  . KNEE SURGERY     right  . LAPAROSCOPIC SUPRACERVICAL HYSTERECTOMY  08/05/2013  . ORIF WRIST FRACTURE Left 01/17/2017   Procedure: OPEN REDUCTION INTERNAL FIXATION (ORIF) WRIST FRACTURE;  Surgeon: Lovell Sheehan, MD;  Location: [facility] ORS;  Service: Orthopedics;  Laterality: Left;  . TUBAL LIGATION Bilateral   . VAGINAL  HYSTERECTOMY  03/2006    There were no vitals filed for this visit.  Subjective Assessment - 05/20/19 1306    Subjective  Patient reports L shoulder pain that had been cramping past couple of days. Patient reports compliance with HEP. No falls or LOB since last session.    Pertinent History  Pt is a 59 y/o F who presents with BLE weakness and difficulty ambulating due to h/o MS that was diagnosed in 1995. Pt denies any pain associated. Pt was admitted to the hospital in November 2018 due to MS exacerbation. Nov-Dec to HHPT-->[facility] until end of Jan. After rehab the pt was able to ambulate up to 100 ft with the RW. Pt has Bil AFO, her therapist at [facility] thinks she needs a new AFO as her feet still drag when she ambulates, pt reports HHPT mentioned a spring AFO. Pt has custom AFOs that were originally made 4 years ago. Goals: improve balance, to be able to get into the shower with her tub bench without assist, to be able to get up 2 steps at sister's home with railings on both sides but too far apart to reach both sides, to be able to drive adapted car. Pt is staying at other sister's house with one step to get inside and currently picking up L leg with UEs  and then stepping in. Pt steps down leading with LLE. Pt wants to work on walking side to side as this is challenging and she had not yet progressed to this at rehab. Pt able to get in/out the car with assist only to bring BLEs into the car. Hoping to drive by June or July with adapted car (already has one). Pt needs assist bringing LEs into and out of hospital bed. Pt able to perform sit>stand without assist from [facility] with a pillow on the seat for a boost. Pt denies any falls in the past 6 months. Pt has been increasing her time alone at home up to a few hours at a time to become more independent. Pt has both manual and power WC. Uses power WC at home, uses manual WC out in community. Pt able to self propel manual WC. Pt needs assist putting pants  around ankles but she can pull them up. Pt can put on her shoes but needs help getting her leg up to do so. Pt able to tie her shoelaces on her own. Pt has a tub/shower unit at home with a tub bench. No grab bars in the shower. Has hand held shower head. Pt currently taking a sponge bath. Pt has a regular height toilet with a BSC over top. Pt with h/o L wrist fx with no precautions, pt wore her soft brace to evaluation for comfort but reports her wrist has healed and is doing well following OT.     Limitations  Lifting;Standing;Walking;House hold activities    How long can you sit comfortably?  n/a    How long can you stand comfortably?  3 minutes without UE assist, limited by fatigue    How long can you walk comfortably?  100 ft, limited by fatigue    Currently in Pain?  Yes    Pain Score  3     Pain Location  Shoulder    Pain Orientation  Left    Pain Descriptors / Indicators  Aching;Throbbing    Pain Type  Acute pain    Pain Onset  More than a month ago    Pain Frequency  Intermittent        L shoulder K tape for scapular retraction and depression 3 pieces, patient educated on safe tape wear and verbalized understanding.  L shoulder retraction and depression overpressure mobilizations 10 seconds    Standing : Weight shift laterally; BUE support, cueing for use of LE's for hip shift rather than use of UE's.  Step over raised BTB across in // bars 10x, each LE Large step forward and backwards one LE at a time ; seated rest break between standing interventions.   seated:   2lb bar  Outward row up tall and reverse overhead raise 10; 10x opposite direction second set  pertubation's to rainbow ball with scapular retraction and straight arm raise 30 seconds   pertubation's to rainbow ball with overhead raise, cueing for scapular depression 30 seconds   seated reach to tap cones: laterally (reach across body), forward, and opp laterally (reach across body) 10x. For core stability and  reach with stability   isometric holds: -flexion 10x 3 second holds -abduction 10x 3 second holds -adduction 10x 3 second holds  Seated rest breaks and use of ice pack utilized to keep body temperature at functional range to reduce MS exacerbation.  Pt educated throughout session about proper posture and technique with exercises. Improved exercise technique, movement at target joints, use of  target muscles after min to mod verbal, visual, tactile cues.                  PT Education - 05/20/19 1310    Education provided  Yes    Education Details  exercise technique, body mechanics    Person(s) Educated  Patient    Methods  Explanation;Demonstration;Tactile cues;Verbal cues    Comprehension  Verbalized understanding;Returned demonstration;Verbal cues required;Tactile cues required       PT Short Term Goals - 03/13/19 1733      PT SHORT TERM GOAL #1   Title  Patient will be compliant with HEP 4-5 days/week for improved carryover between sessions.     Baseline  HEP compliancy between sessions 6/17 compliant    Time  2    Period  Weeks    Status  Achieved    Target Date  10/15/18      PT SHORT TERM GOAL #2   Title  Patient will report no falls/LOB to indicate improved stability with mobility     Baseline  6/17: no falls, one episode of instability 7/20: no falls    Time  2    Period  Weeks    Status  Achieved    Target Date  11/06/18        PT Long Term Goals - 05/06/19 0001      PT LONG TERM GOAL #1   Title  Patient will perform TUG in 40 seconds or less with walker to improve safe negotiation of environment.     Baseline  12/29: 41 seconds with RW 11/5: 45 seconds 10/9: deferred due to pain 5/26: 61 seconds 6/17: 60 seconds 7/20: 59 seconds 8/26:  55 seconds    Time  12    Period  Weeks    Status  Partially Met    Target Date  07/29/19      PT LONG TERM GOAL #2   Title  Pt's 30mT will improve to at least 0.5 m/s for improved safety ambulating in  home    Baseline  12/29: 0.31 m/s 03/13/19: 0.30 m/s 0.14 m/s; 5/15: .221m  7/10: .36 m/s  8/20: .42 m/s  9/26: 0.2681m10/14: .26 m/s improved hip and knee clearance 11/15: 0.82m38mmproved hip and knee clearance; 12/5: .29 m/s improved step length and forward moment: decreased foot drag. 05/09/2018: 0.21 m/s 2/6: .237 m/s 5/26: .23 m/s 6/17: .24 m/s  7/20: 0.26 m/s 8/26: 0.26 m/s 10/8: 0.28 m/s    Time  12    Period  Weeks    Status  Partially Met    Target Date  07/29/19      PT LONG TERM GOAL #3   Title  Pt's Bil hip F strength will improve to at least 2+/5 BLEs for improved gait mechanics and safety    Baseline  12/29:  Hips 2+/5 abduction/adduction, flexion 2/5  Knees 3+/5  11/5: Hips: 2/5 knees 3+/5 10/8: deferred due to pain L: 2/5, R: 1/5 7/10: 2/5 bilaterally  8/20: 2/5 bilat 9/26: 2/5 bilat 10/14: 2/5 bilaterally ,improved hip flexor muscle activation 2/5: improved hip flexor and quad activation: 12/5: 2/5 ; 05/09/2018: 2/5 2/6: 3/5 knee, 2 to 2+/5 hip 5/26: 2-/5 hips 3/5 knees 6/17: 2-/5 hips 3/5 knees    Time  12    Period  Weeks    Status  Partially Met    Target Date  07/29/19      PT LONG TERM GOAL #4  Title  Pt's ABC Scale will improve to at least 40% to demonstrate improved balance    Baseline  12/29: 34.7% 11/5: 32.2% 10/9: 32.1% 28.13% 5/15; 24.4% 7/10: 23.4% 8/20: 22. 2% 9/26: 25.3% 10/14: 26.5% 11/15: 24.38% 12/5: 26%; 05/09/2018: 29% 2/6: 23.75% 526: 30% 6/17: 26.8%  7/2: 28% 8/26: 30.3%    Time  12    Period  Weeks    Status  Partially Met    Target Date  07/29/19      PT LONG TERM GOAL #5   Title  Patient will negotiate one step ~4 inches to enter house independently to increase functional independence in natural environment    Baseline  12/29: 1.9 inch lift after multiple attempts11/5: able to lift 1.75 inch 10/9: has lifted 1.5 inch in previous sessions /20: unable to do 9/26: patient too fatigued/not feeling well to perform 10/14: unable to perform at this time 11/15:  unable to perform at this time; 12/5: able to lift 1 inch; 05/09/2018: unable to clear 4" step, able to lift foot ~ 1in 5/26: has been unable to perform 6/17: able to lift foot 1.25 inch 8/26: has been able to step up onto 1.5 inch step in previous sessions    Time  12    Period  Weeks    Status  Partially Met    Target Date  07/29/19      PT LONG TERM GOAL #6   Title  Patient will negotiate 30 ft of changing surfaces outside such as pavement and parking lot to allow patient to walk into church.    Baseline  12/29: ambulate to cart for stores, too cold to perform for PT this session but has performed multiple times at home11/5; walks outside to cart at store ~20 ft 10/9: walks 20 ft outside at store now 12/5: does not walk outside 2/24: unable to test due to rain 5/26: unable since COVID 6/17: raining 7/20 too hot to test 8/26: unable to walk outside due to heat and COVID    Time  8    Period  Weeks    Status  Achieved      PT LONG TERM GOAL #8   Title  Patient will negotiate tub with mod I to be able to bath/shower in bathroom requiring stability and ability to negotiate small area.     Baseline  12/29: unable to step over current step safely; has to sponge bath standing up in room 11/5: has to sponge bath standing up in room     Time  12    Period  Weeks    Status  On-going    Target Date  07/29/19            Plan - 05/20/19 1311    Clinical Impression Statement  Patient initially presented with L shoulder/scapular pain with "pinching" with movement. Pain reduced with manual, therex, and use of tape. Patient verbalized understanding of taping education. Continued progress with functional standing and seated mobility interventions. The patient will continue to benefit from skilled therapeutic intervention to address deficits in strength, mobility, balance, and function for improved overall QOL and safety    Rehab Potential  Good    PT Frequency  2x / week    PT Duration  12 weeks     PT Treatment/Interventions  ADLs/Self Care Home Management;Aquatic Therapy;Biofeedback;Cryotherapy;Electrical Stimulation;Iontophoresis 41m/ml Dexamethasone;Traction;Moist Heat;Ultrasound;DME Instruction;Gait training;Stair training;Functional mobility training;Therapeutic activities;Therapeutic exercise;Balance training;Neuromuscular re-education;Patient/family education;Orthotic Fit/Training;Wheelchair mobility training;Manual techniques;Compression bandaging;Passive range of motion;Dry needling;Energy conservation;Taping;Splinting  PT Next Visit Plan  strength/balance    PT Home Exercise Plan  to initiate next session (please incorporate hip flexor and adductor stretches in HEP if possible)    Consulted and Agree with Plan of Care  Patient       Patient will benefit from skilled therapeutic intervention in order to improve the following deficits and impairments:  Abnormal gait, Decreased activity tolerance, Decreased balance, Decreased coordination, Decreased endurance, Decreased knowledge of use of DME, Decreased mobility, Decreased range of motion, Decreased safety awareness, Decreased strength, Difficulty walking, Impaired perceived functional ability, Impaired sensation, Impaired tone, Impaired UE functional use, Improper body mechanics, Postural dysfunction  Visit Diagnosis: Muscle weakness (generalized)  Other abnormalities of gait and mobility  Unsteadiness on feet     Problem List Patient Active Problem List   Diagnosis Date Noted  . Abscess of female pelvis   . SVT (supraventricular tachycardia) ([place])   . Radial styloid tenosynovitis 03/12/2018  . Wheelchair confinement 02/27/2018  . Localized osteoporosis with current pathological fracture with routine healing 01/19/2017  . Wrist fracture 01/16/2017  . Sprain of ankle 03/23/2016  . Closed fracture of lateral malleolus 03/16/2016  . Health care maintenance 01/24/2016  . Blood pressure elevated without history of HTN  10/25/2015  . Essential hypertension 10/25/2015  . Multiple sclerosis ([place]) 10/02/2015  . Chronic left shoulder pain 07/19/2015  . Multiple sclerosis exacerbation ([place]) 07/14/2015  . MS (multiple sclerosis) ([place]) 11/26/2014  . Increased body mass index 11/26/2014  . HPV test positive 11/26/2014  . Status post laparoscopic supracervical hysterectomy 11/26/2014  . Galactorrhea 11/26/2014  . Back ache 05/21/2014  . Adiposity 05/21/2014  . Disordered sleep 05/21/2014  . Muscle spasticity 05/21/2014  . Spasticity 05/21/2014  . Calculus of kidney 12/09/2013  . Renal colic 16/02/9603  . Hypercholesteremia 08/19/2013  . Hereditary and idiopathic neuropathy 08/19/2013  . Hypercholesterolemia without hypertriglyceridemia 08/19/2013  . Bladder infection, chronic 07/25/2012  . Disorder of bladder function 07/25/2012  . Incomplete bladder emptying 07/25/2012  . Microscopic hematuria 07/25/2012  . Right upper quadrant pain 07/25/2012   Janna Arch, PT, DPT   05/20/2019, 1:12 PM  [place] MAIN [facility] SERVICES [address] [place], [place], [address] Phone: [phone]   Fax:  [phone]  Name: Lisa Williams MRN: [id number] Date of Birth: 1961-04-20

## 2019-05-22 ENCOUNTER — Ambulatory Visit: Payer: PPO

## 2019-05-27 ENCOUNTER — Other Ambulatory Visit: Payer: Self-pay

## 2019-05-27 ENCOUNTER — Ambulatory Visit: Payer: PPO

## 2019-05-27 DIAGNOSIS — M6281 Muscle weakness (generalized): Secondary | ICD-10-CM | POA: Diagnosis not present

## 2019-05-27 DIAGNOSIS — M5412 Radiculopathy, cervical region: Secondary | ICD-10-CM

## 2019-05-27 DIAGNOSIS — R2681 Unsteadiness on feet: Secondary | ICD-10-CM

## 2019-05-27 DIAGNOSIS — R2689 Other abnormalities of gait and mobility: Secondary | ICD-10-CM

## 2019-05-27 NOTE — Therapy (Signed)
[place] MAIN [facility] SERVICES [address] [place], [place], [address] Phone: [phone]   Fax:  [phone]  Physical Therapy Treatment  Patient Details  Name: Lisa Williams MRN: [id number] Date of Birth: 04/04/1961 No data recorded  Encounter Date: 05/27/2019  PT End of Session - 05/27/19 1132    Visit Number  [id number]    Number of Visits  164    Date for PT Re-Evaluation  07/29/19    Authorization Type  5/10 PN 05/06/19    PT Start Time  1030    PT Stop Time  1115    PT Time Calculation (min)  45 min    Equipment Utilized During Treatment  Gait belt    Activity Tolerance  Patient tolerated treatment well;Other (comment)   heat intolerance   Behavior During Therapy  WFL for tasks assessed/performed       Past Medical History:  Diagnosis Date  . Abdominal pain, right upper quadrant   . Back pain   . Calculus of kidney 12/09/2013  . Chronic back pain    unspecified  . Chronic left shoulder pain 07/19/2015  . Functional disorder of bladder    other  . Galactorrhea 11/26/2014   Chronic   . Hereditary and idiopathic neuropathy 08/19/2013  . HPV test positive   . Hypercholesteremia 08/19/2013  . Incomplete bladder emptying   . Microscopic hematuria   . MS (multiple sclerosis) ([place])   . Muscle spasticity 05/21/2014  . Nonspecific findings on examination of urine    other  . Osteopenia   . Status post laparoscopic supracervical hysterectomy 11/26/2014  . Tobacco user 11/26/2014  . Wrist fracture     Past Surgical History:  Procedure Laterality Date  . bilateral tubal ligation  1996  . BREAST CYST EXCISION Left 2002  . KNEE SURGERY     right  . LAPAROSCOPIC SUPRACERVICAL HYSTERECTOMY  08/05/2013  . ORIF WRIST FRACTURE Left 01/17/2017   Procedure: OPEN REDUCTION INTERNAL FIXATION (ORIF) WRIST FRACTURE;  Surgeon: Lovell Sheehan, MD;  Location: [facility] ORS;  Service: Orthopedics;  Laterality: Left;  . TUBAL LIGATION Bilateral   .  VAGINAL HYSTERECTOMY  03/2006    There were no vitals filed for this visit.  Subjective Assessment - 05/27/19 1047    Subjective  Patient reported no shoulder pain at start of session. Compliant with HEP, no falls or stumbles since last visit.    Patient is accompained by:  Family member    Pertinent History  Pt is a 59 y/o F who presents with BLE weakness and difficulty ambulating due to h/o MS that was diagnosed in 1995. Pt denies any pain associated. Pt was admitted to the hospital in November 2018 due to MS exacerbation. Nov-Dec to HHPT-->[facility] until end of Jan. After rehab the pt was able to ambulate up to 100 ft with the RW. Pt has Bil AFO, her therapist at [facility] thinks she needs a new AFO as her feet still drag when she ambulates, pt reports HHPT mentioned a spring AFO. Pt has custom AFOs that were originally made 4 years ago. Goals: improve balance, to be able to get into the shower with her tub bench without assist, to be able to get up 2 steps at sister's home with railings on both sides but too far apart to reach both sides, to be able to drive adapted car. Pt is staying at other sister's house with one step to get inside and currently picking  up L leg with UEs and then stepping in. Pt steps down leading with LLE. Pt wants to work on walking side to side as this is challenging and she had not yet progressed to this at rehab. Pt able to get in/out the car with assist only to bring BLEs into the car. Hoping to drive by June or July with adapted car (already has one). Pt needs assist bringing LEs into and out of hospital bed. Pt able to perform sit>stand without assist from [facility] with a pillow on the seat for a boost. Pt denies any falls in the past 6 months. Pt has been increasing her time alone at home up to a few hours at a time to become more independent. Pt has both manual and power WC. Uses power WC at home, uses manual WC out in community. Pt able to self propel manual WC. Pt needs  assist putting pants around ankles but she can pull them up. Pt can put on her shoes but needs help getting her leg up to do so. Pt able to tie her shoelaces on her own. Pt has a tub/shower unit at home with a tub bench. No grab bars in the shower. Has hand held shower head. Pt currently taking a sponge bath. Pt has a regular height toilet with a BSC over top. Pt with h/o L wrist fx with no precautions, pt wore her soft brace to evaluation for comfort but reports her wrist has healed and is doing well following OT.     Limitations  Lifting;Standing;Walking;House hold activities    How long can you sit comfortably?  n/a    How long can you stand comfortably?  3 minutes without UE assist, limited by fatigue    How long can you walk comfortably?  100 ft, limited by fatigue    Patient Stated Goals  see above    Currently in Pain?  No/denies       Treatment  Standing: Weight shift laterally; BUE support, cueing for use of LE's for hip shift rather than use of UE's x30secs Mini squats x10 with BUE support pt reported using majority of legs to achieve  Seated: Seated rows with GTB without trunk support 2x12 Shoulder reactive isometrics during ranges of shoulder flexion x30sec ea with BTB pertubation's to rainbow ball with overhead raise, cueing for scapular depression 30 seconds seated lumbar flexion and lateral flexion x5-8 ea direction Seated in wheelchair forward motion performed with hamstring curl; pt utilized UE to reposition LE ea round  isometric holds without trunk support:  -flexion 10x 3 second holds -extension 10 x3 second holds -lateral flexion L and R 10 x 3 second  Seated rest breaks and use of ice pack utilized to keep body temperature at functional range to reduce MS exacerbation.     Pt educated throughout session about proper posture and technique with exercises. Improved exercise technique, movement at target joints, use of target muscles after min to mod verbal, visual,  tactile cues.   Pt response/clinical impression: The patient reported fatigue at end of session, stated she felt worked out. Pt challenged by core exercises as well as hamstring activation in sitting. The patient would benefit from further skilled PT intervention to improve function, QOL, and safety.    PT Education - 05/27/19 1048    Education provided  Yes    Education Details  exercise technique, body mechanics    Person(s) Educated  Patient    Methods  Explanation;Demonstration;Tactile cues;Verbal cues  Comprehension  Verbalized understanding;Returned demonstration;Tactile cues required;Verbal cues required       PT Short Term Goals - 03/13/19 1733      PT SHORT TERM GOAL #1   Title  Patient will be compliant with HEP 4-5 days/week for improved carryover between sessions.     Baseline  HEP compliancy between sessions 6/17 compliant    Time  2    Period  Weeks    Status  Achieved    Target Date  10/15/18      PT SHORT TERM GOAL #2   Title  Patient will report no falls/LOB to indicate improved stability with mobility     Baseline  6/17: no falls, one episode of instability 7/20: no falls    Time  2    Period  Weeks    Status  Achieved    Target Date  11/06/18        PT Long Term Goals - 05/06/19 0001      PT LONG TERM GOAL #1   Title  Patient will perform TUG in 40 seconds or less with walker to improve safe negotiation of environment.     Baseline  12/29: 41 seconds with RW 11/5: 45 seconds 10/9: deferred due to pain 5/26: 61 seconds 6/17: 60 seconds 7/20: 59 seconds 8/26:  55 seconds    Time  12    Period  Weeks    Status  Partially Met    Target Date  07/29/19      PT LONG TERM GOAL #2   Title  Pt's 58mT will improve to at least 0.5 m/s for improved safety ambulating in home    Baseline  12/29: 0.31 m/s 03/13/19: 0.30 m/s 0.14 m/s; 5/15: .273m  7/10: .36 m/s  8/20: .42 m/s  9/26: 0.2636m10/14: .26 m/s improved hip and knee clearance 11/15: 0.60m41mmproved hip  and knee clearance; 12/5: .29 m/s improved step length and forward moment: decreased foot drag. 05/09/2018: 0.21 m/s 2/6: .237 m/s 5/26: .23 m/s 6/17: .24 m/s  7/20: 0.26 m/s 8/26: 0.26 m/s 10/8: 0.28 m/s    Time  12    Period  Weeks    Status  Partially Met    Target Date  07/29/19      PT LONG TERM GOAL #3   Title  Pt's Bil hip F strength will improve to at least 2+/5 BLEs for improved gait mechanics and safety    Baseline  12/29:  Hips 2+/5 abduction/adduction, flexion 2/5  Knees 3+/5  11/5: Hips: 2/5 knees 3+/5 10/8: deferred due to pain L: 2/5, R: 1/5 7/10: 2/5 bilaterally  8/20: 2/5 bilat 9/26: 2/5 bilat 10/14: 2/5 bilaterally ,improved hip flexor muscle activation 2/5: improved hip flexor and quad activation: 12/5: 2/5 ; 05/09/2018: 2/5 2/6: 3/5 knee, 2 to 2+/5 hip 5/26: 2-/5 hips 3/5 knees 6/17: 2-/5 hips 3/5 knees    Time  12    Period  Weeks    Status  Partially Met    Target Date  07/29/19      PT LONG TERM GOAL #4   Title  Pt's ABC Scale will improve to at least 40% to demonstrate improved balance    Baseline  12/29: 34.7% 11/5: 32.2% 10/9: 32.1% 28.13% 5/15; 24.4% 7/10: 23.4% 8/20: 22. 2% 9/26: 25.3% 10/14: 26.5% 11/15: 24.38% 12/5: 26%; 05/09/2018: 29% 2/6: 23.75% 526: 30% 6/17: 26.8%  7/2: 28% 8/26: 30.3%    Time  12    Period  Weeks  Status  Partially Met    Target Date  07/29/19      PT LONG TERM GOAL #5   Title  Patient will negotiate one step ~4 inches to enter house independently to increase functional independence in natural environment    Baseline  12/29: 1.9 inch lift after multiple attempts11/5: able to lift 1.75 inch 10/9: has lifted 1.5 inch in previous sessions /20: unable to do 9/26: patient too fatigued/not feeling well to perform 10/14: unable to perform at this time 11/15: unable to perform at this time; 12/5: able to lift 1 inch; 05/09/2018: unable to clear 4" step, able to lift foot ~ 1in 5/26: has been unable to perform 6/17: able to lift foot 1.25 inch 8/26: has  been able to step up onto 1.5 inch step in previous sessions    Time  12    Period  Weeks    Status  Partially Met    Target Date  07/29/19      PT LONG TERM GOAL #6   Title  Patient will negotiate 30 ft of changing surfaces outside such as pavement and parking lot to allow patient to walk into church.    Baseline  12/29: ambulate to cart for stores, too cold to perform for PT this session but has performed multiple times at home11/5; walks outside to cart at store ~20 ft 10/9: walks 20 ft outside at store now 12/5: does not walk outside 2/24: unable to test due to rain 5/26: unable since COVID 6/17: raining 7/20 too hot to test 8/26: unable to walk outside due to heat and COVID    Time  8    Period  Weeks    Status  Achieved      PT LONG TERM GOAL #8   Title  Patient will negotiate tub with mod I to be able to bath/shower in bathroom requiring stability and ability to negotiate small area.     Baseline  12/29: unable to step over current step safely; has to sponge bath standing up in room 11/5: has to sponge bath standing up in room     Time  12    Period  Weeks    Status  On-going    Target Date  07/29/19            Plan - 05/27/19 1124    Clinical Impression Statement  The patient reported fatigue at end of session, stated she felt worked out. Pt challenged by core exercises as well as hamstring activation in sitting. The patient would benefit from further skilled PT intervention to improve function, QOL, and safety.    Rehab Potential  Good    PT Frequency  2x / week    PT Duration  12 weeks    PT Treatment/Interventions  ADLs/Self Care Home Management;Aquatic Therapy;Biofeedback;Cryotherapy;Electrical Stimulation;Iontophoresis '4mg'$ /ml Dexamethasone;Traction;Moist Heat;Ultrasound;DME Instruction;Gait training;Stair training;Functional mobility training;Therapeutic activities;Therapeutic exercise;Balance training;Neuromuscular re-education;Patient/family education;Orthotic  Fit/Training;Wheelchair mobility training;Manual techniques;Compression bandaging;Passive range of motion;Dry needling;Energy conservation;Taping;Splinting    PT Next Visit Plan  strength/balance    PT Home Exercise Plan  to initiate next session (please incorporate hip flexor and adductor stretches in HEP if possible)    Consulted and Agree with Plan of Care  Patient       Patient will benefit from skilled therapeutic intervention in order to improve the following deficits and impairments:  Abnormal gait, Decreased activity tolerance, Decreased balance, Decreased coordination, Decreased endurance, Decreased knowledge of use of DME, Decreased mobility, Decreased range of motion,  Decreased safety awareness, Decreased strength, Difficulty walking, Impaired perceived functional ability, Impaired sensation, Impaired tone, Impaired UE functional use, Improper body mechanics, Postural dysfunction  Visit Diagnosis: Muscle weakness (generalized)  Other abnormalities of gait and mobility  Unsteadiness on feet  Radiculopathy, cervical region     Problem List Patient Active Problem List   Diagnosis Date Noted  . Abscess of female pelvis   . SVT (supraventricular tachycardia) ([place])   . Radial styloid tenosynovitis 03/12/2018  . Wheelchair confinement 02/27/2018  . Localized osteoporosis with current pathological fracture with routine healing 01/19/2017  . Wrist fracture 01/16/2017  . Sprain of ankle 03/23/2016  . Closed fracture of lateral malleolus 03/16/2016  . Health care maintenance 01/24/2016  . Blood pressure elevated without history of HTN 10/25/2015  . Essential hypertension 10/25/2015  . Multiple sclerosis ([place]) 10/02/2015  . Chronic left shoulder pain 07/19/2015  . Multiple sclerosis exacerbation ([place]) 07/14/2015  . MS (multiple sclerosis) ([place]) 11/26/2014  . Increased body mass index 11/26/2014  . HPV test positive 11/26/2014  . Status post laparoscopic supracervical  hysterectomy 11/26/2014  . Galactorrhea 11/26/2014  . Back ache 05/21/2014  . Adiposity 05/21/2014  . Disordered sleep 05/21/2014  . Muscle spasticity 05/21/2014  . Spasticity 05/21/2014  . Calculus of kidney 12/09/2013  . Renal colic 97/94/9971  . Hypercholesteremia 08/19/2013  . Hereditary and idiopathic neuropathy 08/19/2013  . Hypercholesterolemia without hypertriglyceridemia 08/19/2013  . Bladder infection, chronic 07/25/2012  . Disorder of bladder function 07/25/2012  . Incomplete bladder emptying 07/25/2012  . Microscopic hematuria 07/25/2012  . Right upper quadrant pain 07/25/2012    Lieutenant Diego PT, DPT 11:35 AM,05/27/19   [place] MAIN [facility] SERVICES [address] [place], [place], [address] Phone: [phone]   Fax:  [phone]  Name: Lisa Williams MRN: [id number] Date of Birth: [birth year]

## 2019-05-29 ENCOUNTER — Ambulatory Visit: Payer: PPO

## 2019-05-29 ENCOUNTER — Other Ambulatory Visit: Payer: Self-pay

## 2019-05-29 DIAGNOSIS — M6281 Muscle weakness (generalized): Secondary | ICD-10-CM | POA: Diagnosis not present

## 2019-05-29 DIAGNOSIS — R2681 Unsteadiness on feet: Secondary | ICD-10-CM

## 2019-05-29 DIAGNOSIS — R2689 Other abnormalities of gait and mobility: Secondary | ICD-10-CM

## 2019-05-29 NOTE — Therapy (Signed)
[place] MAIN [facility] SERVICES [address] [place], [place], [address] Phone: [phone]   Fax:  [phone]  Physical Therapy Treatment  Patient Details  Name: Lisa Williams MRN: [id number] Date of Birth: 10/21/1960 No data recorded  Encounter Date: 05/29/2019  PT End of Session - 05/29/19 1114    Visit Number  [id number]    Number of Visits  164    Date for PT Re-Evaluation  07/29/19    Authorization Type  6/10 PN 05/06/19    PT Start Time  1030    PT Stop Time  1112    PT Time Calculation (min)  42 min    Equipment Utilized During Treatment  Gait belt    Activity Tolerance  Patient tolerated treatment well;Other (comment)   heat intolerance   Behavior During Therapy  WFL for tasks assessed/performed       Past Medical History:  Diagnosis Date  . Abdominal pain, right upper quadrant   . Back pain   . Calculus of kidney 12/09/2013  . Chronic back pain    unspecified  . Chronic left shoulder pain 07/19/2015  . Functional disorder of bladder    other  . Galactorrhea 11/26/2014   Chronic   . Hereditary and idiopathic neuropathy 08/19/2013  . HPV test positive   . Hypercholesteremia 08/19/2013  . Incomplete bladder emptying   . Microscopic hematuria   . MS (multiple sclerosis) ([place])   . Muscle spasticity 05/21/2014  . Nonspecific findings on examination of urine    other  . Osteopenia   . Status post laparoscopic supracervical hysterectomy 11/26/2014  . Tobacco user 11/26/2014  . Wrist fracture     Past Surgical History:  Procedure Laterality Date  . bilateral tubal ligation  1996  . BREAST CYST EXCISION Left 2002  . KNEE SURGERY     right  . LAPAROSCOPIC SUPRACERVICAL HYSTERECTOMY  08/05/2013  . ORIF WRIST FRACTURE Left 01/17/2017   Procedure: OPEN REDUCTION INTERNAL FIXATION (ORIF) WRIST FRACTURE;  Surgeon: Lovell Sheehan, MD;  Location: [facility] ORS;  Service: Orthopedics;  Laterality: Left;  . TUBAL LIGATION Bilateral   .  VAGINAL HYSTERECTOMY  03/2006    There were no vitals filed for this visit.  Subjective Assessment - 05/29/19 1112    Subjective  Patient reports no falls or LOB since last PT session, reports no pain this session.    Patient is accompained by:  Family member    Pertinent History  Pt is a 59 y/o F who presents with BLE weakness and difficulty ambulating due to h/o MS that was diagnosed in 1995. Pt denies any pain associated. Pt was admitted to the hospital in November 2018 due to MS exacerbation. Nov-Dec to HHPT-->[facility] until end of Jan. After rehab the pt was able to ambulate up to 100 ft with the RW. Pt has Bil AFO, her therapist at [place] thinks she needs a new AFO as her feet still drag when she ambulates, pt reports HHPT mentioned a spring AFO. Pt has custom AFOs that were originally made 4 years ago. Goals: improve balance, to be able to get into the shower with her tub bench without assist, to be able to get up 2 steps at sister's home with railings on both sides but too far apart to reach both sides, to be able to drive adapted car. Pt is staying at other sister's house with one step to get inside and currently picking up L leg with  UEs and then stepping in. Pt steps down leading with LLE. Pt wants to work on walking side to side as this is challenging and she had not yet progressed to this at rehab. Pt able to get in/out the car with assist only to bring BLEs into the car. Hoping to drive by June or July with adapted car (already has one). Pt needs assist bringing LEs into and out of hospital bed. Pt able to perform sit>stand without assist from [facility] with a pillow on the seat for a boost. Pt denies any falls in the past 6 months. Pt has been increasing her time alone at home up to a few hours at a time to become more independent. Pt has both manual and power WC. Uses power WC at home, uses manual WC out in community. Pt able to self propel manual WC. Pt needs assist putting pants around  ankles but she can pull them up. Pt can put on her shoes but needs help getting her leg up to do so. Pt able to tie her shoelaces on her own. Pt has a tub/shower unit at home with a tub bench. No grab bars in the shower. Has hand held shower head. Pt currently taking a sponge bath. Pt has a regular height toilet with a BSC over top. Pt with h/o L wrist fx with no precautions, pt wore her soft brace to evaluation for comfort but reports her wrist has healed and is doing well following OT.     Limitations  Lifting;Standing;Walking;House hold activities    How long can you sit comfortably?  n/a    How long can you stand comfortably?  3 minutes without UE assist, limited by fatigue    How long can you walk comfortably?  100 ft, limited by fatigue    Patient Stated Goals  see above    Currently in Pain?  No/denies         Treatment: Standing hip hike for glutea activation. 10x each LE, seated rest break between interventions  Standing with RW: marching to GTB 10x each LE.   Ambulate with RW 76 ft with RW and CGA with cueing for foot clearance, negotiation of obstacles and turning.  2 seated rest breaks.    seated: supine facing body blade  Straight forward 10x each UE Lateral 10x each UE    isometric holds: -flexion 10x 3 second holds -abduction 10x 3 second holds -adduction 10x 3 second holds   Seated rest breaks and use of ice pack utilized to keep body temperature at functional range to reduce MS exacerbation.  Pt educated throughout session about proper posture and technique with exercises. Improved exercise technique, movement at target joints, use of target muscles after min to mod verbal, visual, tactile cues.                  PT Education - 05/29/19 1112    Education provided  Yes    Education Details  exercise technique, body mechanics    Person(s) Educated  Patient    Methods  Explanation;Demonstration;Tactile cues;Verbal cues    Comprehension   Verbalized understanding;Returned demonstration;Verbal cues required;Tactile cues required       PT Short Term Goals - 03/13/19 1733      PT SHORT TERM GOAL #1   Title  Patient will be compliant with HEP 4-5 days/week for improved carryover between sessions.     Baseline  HEP compliancy between sessions 6/17 compliant    Time  2  Period  Weeks    Status  Achieved    Target Date  10/15/18      PT SHORT TERM GOAL #2   Title  Patient will report no falls/LOB to indicate improved stability with mobility     Baseline  6/17: no falls, one episode of instability 7/20: no falls    Time  2    Period  Weeks    Status  Achieved    Target Date  11/06/18        PT Long Term Goals - 05/06/19 0001      PT LONG TERM GOAL #1   Title  Patient will perform TUG in 40 seconds or less with walker to improve safe negotiation of environment.     Baseline  12/29: 41 seconds with RW 11/5: 45 seconds 10/9: deferred due to pain 5/26: 61 seconds 6/17: 60 seconds 7/20: 59 seconds 8/26:  55 seconds    Time  12    Period  Weeks    Status  Partially Met    Target Date  07/29/19      PT LONG TERM GOAL #2   Title  Pt's 46mT will improve to at least 0.5 m/s for improved safety ambulating in home    Baseline  12/29: 0.31 m/s 03/13/19: 0.30 m/s 0.14 m/s; 5/15: .283m  7/10: .36 m/s  8/20: .42 m/s  9/26: 0.2645m10/14: .26 m/s improved hip and knee clearance 11/15: 0.58m37mmproved hip and knee clearance; 12/5: .29 m/s improved step length and forward moment: decreased foot drag. 05/09/2018: 0.21 m/s 2/6: .237 m/s 5/26: .23 m/s 6/17: .24 m/s  7/20: 0.26 m/s 8/26: 0.26 m/s 10/8: 0.28 m/s    Time  12    Period  Weeks    Status  Partially Met    Target Date  07/29/19      PT LONG TERM GOAL #3   Title  Pt's Bil hip F strength will improve to at least 2+/5 BLEs for improved gait mechanics and safety    Baseline  12/29:  Hips 2+/5 abduction/adduction, flexion 2/5  Knees 3+/5  11/5: Hips: 2/5 knees 3+/5 10/8:  deferred due to pain L: 2/5, R: 1/5 7/10: 2/5 bilaterally  8/20: 2/5 bilat 9/26: 2/5 bilat 10/14: 2/5 bilaterally ,improved hip flexor muscle activation 2/5: improved hip flexor and quad activation: 12/5: 2/5 ; 05/09/2018: 2/5 2/6: 3/5 knee, 2 to 2+/5 hip 5/26: 2-/5 hips 3/5 knees 6/17: 2-/5 hips 3/5 knees    Time  12    Period  Weeks    Status  Partially Met    Target Date  07/29/19      PT LONG TERM GOAL #4   Title  Pt's ABC Scale will improve to at least 40% to demonstrate improved balance    Baseline  12/29: 34.7% 11/5: 32.2% 10/9: 32.1% 28.13% 5/15; 24.4% 7/10: 23.4% 8/20: 22. 2% 9/26: 25.3% 10/14: 26.5% 11/15: 24.38% 12/5: 26%; 05/09/2018: 29% 2/6: 23.75% 526: 30% 6/17: 26.8%  7/2: 28% 8/26: 30.3%    Time  12    Period  Weeks    Status  Partially Met    Target Date  07/29/19      PT LONG TERM GOAL #5   Title  Patient will negotiate one step ~4 inches to enter house independently to increase functional independence in natural environment    Baseline  12/29: 1.9 inch lift after multiple attempts11/5: able to lift 1.75 inch 10/9: has lifted 1.5 inch in previous  sessions /20: unable to do 9/26: patient too fatigued/not feeling well to perform 10/14: unable to perform at this time 11/15: unable to perform at this time; 12/5: able to lift 1 inch; 05/09/2018: unable to clear 4" step, able to lift foot ~ 1in 5/26: has been unable to perform 6/17: able to lift foot 1.25 inch 8/26: has been able to step up onto 1.5 inch step in previous sessions    Time  12    Period  Weeks    Status  Partially Met    Target Date  07/29/19      PT LONG TERM GOAL #6   Title  Patient will negotiate 30 ft of changing surfaces outside such as pavement and parking lot to allow patient to walk into church.    Baseline  12/29: ambulate to cart for stores, too cold to perform for PT this session but has performed multiple times at home11/5; walks outside to cart at store ~20 ft 10/9: walks 20 ft outside at store now 12/5: does  not walk outside 2/24: unable to test due to rain 5/26: unable since COVID 6/17: raining 7/20 too hot to test 8/26: unable to walk outside due to heat and COVID    Time  8    Period  Weeks    Status  Achieved      PT LONG TERM GOAL #8   Title  Patient will negotiate tub with mod I to be able to bath/shower in bathroom requiring stability and ability to negotiate small area.     Baseline  12/29: unable to step over current step safely; has to sponge bath standing up in room 11/5: has to sponge bath standing up in room     Time  12    Period  Weeks    Status  On-going    Target Date  07/29/19            Plan - 05/29/19 1358    Clinical Impression Statement  Patient presents to physical therapy with good motivation. She continues to demonstrate increased capacity for mobility with decreased need for rest break and decreased length of rest breaks between interventions. The patient will continue to benefit from skilled therapeutic intervention to address deficits in strength, mobility, balance, and function for improved overall QOL and safety    Rehab Potential  Good    PT Frequency  2x / week    PT Duration  12 weeks    PT Treatment/Interventions  ADLs/Self Care Home Management;Aquatic Therapy;Biofeedback;Cryotherapy;Electrical Stimulation;Iontophoresis 60m/ml Dexamethasone;Traction;Moist Heat;Ultrasound;DME Instruction;Gait training;Stair training;Functional mobility training;Therapeutic activities;Therapeutic exercise;Balance training;Neuromuscular re-education;Patient/family education;Orthotic Fit/Training;Wheelchair mobility training;Manual techniques;Compression bandaging;Passive range of motion;Dry needling;Energy conservation;Taping;Splinting    PT Next Visit Plan  strength/balance    PT Home Exercise Plan  to initiate next session (please incorporate hip flexor and adductor stretches in HEP if possible)    Consulted and Agree with Plan of Care  Patient       Patient will benefit  from skilled therapeutic intervention in order to improve the following deficits and impairments:  Abnormal gait, Decreased activity tolerance, Decreased balance, Decreased coordination, Decreased endurance, Decreased knowledge of use of DME, Decreased mobility, Decreased range of motion, Decreased safety awareness, Decreased strength, Difficulty walking, Impaired perceived functional ability, Impaired sensation, Impaired tone, Impaired UE functional use, Improper body mechanics, Postural dysfunction  Visit Diagnosis: Muscle weakness (generalized)  Other abnormalities of gait and mobility  Unsteadiness on feet     Problem List Patient Active Problem List  Diagnosis Date Noted  . Abscess of female pelvis   . SVT (supraventricular tachycardia) ([place])   . Radial styloid tenosynovitis 03/12/2018  . Wheelchair confinement 02/27/2018  . Localized osteoporosis with current pathological fracture with routine healing 01/19/2017  . Wrist fracture 01/16/2017  . Sprain of ankle 03/23/2016  . Closed fracture of lateral malleolus 03/16/2016  . Health care maintenance 01/24/2016  . Blood pressure elevated without history of HTN 10/25/2015  . Essential hypertension 10/25/2015  . Multiple sclerosis ([place]) 10/02/2015  . Chronic left shoulder pain 07/19/2015  . Multiple sclerosis exacerbation ([place]) 07/14/2015  . MS (multiple sclerosis) ([place]) 11/26/2014  . Increased body mass index 11/26/2014  . HPV test positive 11/26/2014  . Status post laparoscopic supracervical hysterectomy 11/26/2014  . Galactorrhea 11/26/2014  . Back ache 05/21/2014  . Adiposity 05/21/2014  . Disordered sleep 05/21/2014  . Muscle spasticity 05/21/2014  . Spasticity 05/21/2014  . Calculus of kidney 12/09/2013  . Renal colic 60/63/0160  . Hypercholesteremia 08/19/2013  . Hereditary and idiopathic neuropathy 08/19/2013  . Hypercholesterolemia without hypertriglyceridemia 08/19/2013  . Bladder infection, chronic 07/25/2012   . Disorder of bladder function 07/25/2012  . Incomplete bladder emptying 07/25/2012  . Microscopic hematuria 07/25/2012  . Right upper quadrant pain 07/25/2012   Janna Arch, PT, DPT   05/29/2019, 1:58 PM  [place] MAIN [facility] SERVICES [address] [place], [place], [address] Phone: [phone]   Fax:  [phone]  Name: Lisa Williams MRN: [id number] Date of Birth: 31-Oct-1960

## 2019-06-03 ENCOUNTER — Other Ambulatory Visit: Payer: Self-pay

## 2019-06-03 ENCOUNTER — Ambulatory Visit: Payer: PPO

## 2019-06-03 DIAGNOSIS — M6281 Muscle weakness (generalized): Secondary | ICD-10-CM | POA: Diagnosis not present

## 2019-06-03 DIAGNOSIS — R2681 Unsteadiness on feet: Secondary | ICD-10-CM

## 2019-06-03 DIAGNOSIS — R2689 Other abnormalities of gait and mobility: Secondary | ICD-10-CM

## 2019-06-03 NOTE — Therapy (Signed)
[place] MAIN [facility] SERVICES [address] [place], [place], [address] Phone: [phone]   Fax:  [phone]  Physical Therapy Treatment  Patient Details  Name: Lisa Williams MRN: [id number] Date of Birth: Nov 03, 1960 No data recorded  Encounter Date: 06/03/2019  PT End of Session - 06/03/19 1714    Visit Number  [id number]    Number of Visits  164    Date for PT Re-Evaluation  07/29/19    Authorization Type  7/10 PN 05/06/19    PT Start Time  1030    PT Stop Time  1113    PT Time Calculation (min)  43 min    Equipment Utilized During Treatment  Gait belt    Activity Tolerance  Patient tolerated treatment well;Other (comment)   heat intolerance   Behavior During Therapy  WFL for tasks assessed/performed       Past Medical History:  Diagnosis Date  . Abdominal pain, right upper quadrant   . Back pain   . Calculus of kidney 12/09/2013  . Chronic back pain    unspecified  . Chronic left shoulder pain 07/19/2015  . Functional disorder of bladder    other  . Galactorrhea 11/26/2014   Chronic   . Hereditary and idiopathic neuropathy 08/19/2013  . HPV test positive   . Hypercholesteremia 08/19/2013  . Incomplete bladder emptying   . Microscopic hematuria   . MS (multiple sclerosis) ([place])   . Muscle spasticity 05/21/2014  . Nonspecific findings on examination of urine    other  . Osteopenia   . Status post laparoscopic supracervical hysterectomy 11/26/2014  . Tobacco user 11/26/2014  . Wrist fracture     Past Surgical History:  Procedure Laterality Date  . bilateral tubal ligation  1996  . BREAST CYST EXCISION Left 2002  . KNEE SURGERY     right  . LAPAROSCOPIC SUPRACERVICAL HYSTERECTOMY  08/05/2013  . ORIF WRIST FRACTURE Left 01/17/2017   Procedure: OPEN REDUCTION INTERNAL FIXATION (ORIF) WRIST FRACTURE;  Surgeon: Lovell Sheehan, MD;  Location: [facility] ORS;  Service: Orthopedics;  Laterality: Left;  . TUBAL LIGATION Bilateral   .  VAGINAL HYSTERECTOMY  03/2006    There were no vitals filed for this visit.  Subjective Assessment - 06/03/19 1712    Subjective  Patient reports feeling tired but no pain this session. Has been compliant with HEP, no falls or LOB since last session.    Patient is accompained by:  Family member    Pertinent History  Pt is a 59 y/o F who presents with BLE weakness and difficulty ambulating due to h/o MS that was diagnosed in 1995. Pt denies any pain associated. Pt was admitted to the hospital in November 2018 due to MS exacerbation. Nov-Dec to HHPT-->[facility] until end of Jan. After rehab the pt was able to ambulate up to 100 ft with the RW. Pt has Bil AFO, her therapist at [place] thinks she needs a new AFO as her feet still drag when she ambulates, pt reports HHPT mentioned a spring AFO. Pt has custom AFOs that were originally made 4 years ago. Goals: improve balance, to be able to get into the shower with her tub bench without assist, to be able to get up 2 steps at sister's home with railings on both sides but too far apart to reach both sides, to be able to drive adapted car. Pt is staying at other sister's house with one step to get inside and  currently picking up L leg with UEs and then stepping in. Pt steps down leading with LLE. Pt wants to work on walking side to side as this is challenging and she had not yet progressed to this at rehab. Pt able to get in/out the car with assist only to bring BLEs into the car. Hoping to drive by June or July with adapted car (already has one). Pt needs assist bringing LEs into and out of hospital bed. Pt able to perform sit>stand without assist from [facility] with a pillow on the seat for a boost. Pt denies any falls in the past 6 months. Pt has been increasing her time alone at home up to a few hours at a time to become more independent. Pt has both manual and power WC. Uses power WC at home, uses manual WC out in community. Pt able to self propel manual WC. Pt  needs assist putting pants around ankles but she can pull them up. Pt can put on her shoes but needs help getting her leg up to do so. Pt able to tie her shoelaces on her own. Pt has a tub/shower unit at home with a tub bench. No grab bars in the shower. Has hand held shower head. Pt currently taking a sponge bath. Pt has a regular height toilet with a BSC over top. Pt with h/o L wrist fx with no precautions, pt wore her soft brace to evaluation for comfort but reports her wrist has healed and is doing well following OT.     Limitations  Lifting;Standing;Walking;House hold activities    How long can you sit comfortably?  n/a    How long can you stand comfortably?  3 minutes without UE assist, limited by fatigue    How long can you walk comfortably?  100 ft, limited by fatigue    Patient Stated Goals  see above    Currently in Pain?  No/denies        Treatment: Standing in // bars:   weight shifts L, R with focus on hip shift rather than pulling with UE's 10x each side large step let go with one hand then the other for no UE support, 4x each LE Weight shift with hip extension 8x RLE, 4x LLE, more fatigued requiring termination.    Standing interventions require seated icing breaks between switching of limbs due to fatigue.    Seated; BTB paloff press 12x each UE Modified windmills 5x each side BTB rows 15x with max cueing for decreased shoulder elevation  Isometrics: -hip flexion into PT hands 12x 3 second holds each LE, single LE at a time -hip abduction into PT hands 12x 3 second holds  -hip adduction into PT hands 12x 3 second holds  Cross body jabs with increasing difficulty and range, increasing challenge to L, R, duck for core stability and postural reactions. -Jab L , R duck for cross body coordination and postural control/righting reactions 10x -Jab R, L duck for cross body coordination and postural control/righting reactions 10x -Cross body elbowfor abdominal  strengthening and postural challenge x 60 seconds Seated rest breaks and use of ice pack utilized to keep body temperature at functional range to reduce MS exacerbation.  Pt educated throughout session about proper posture and technique with exercises. Improved exercise technique, movement at target joints, use of target muscles after min to mod verbal, visual, tactile cues.                  PT Education -  06/03/19 1713    Education provided  Yes    Education Details  exercise technique, body mechanics    Person(s) Educated  Patient    Methods  Explanation;Demonstration;Tactile cues;Verbal cues    Comprehension  Verbalized understanding;Returned demonstration;Verbal cues required;Tactile cues required       PT Short Term Goals - 03/13/19 1733      PT SHORT TERM GOAL #1   Title  Patient will be compliant with HEP 4-5 days/week for improved carryover between sessions.     Baseline  HEP compliancy between sessions 6/17 compliant    Time  2    Period  Weeks    Status  Achieved    Target Date  10/15/18      PT SHORT TERM GOAL #2   Title  Patient will report no falls/LOB to indicate improved stability with mobility     Baseline  6/17: no falls, one episode of instability 7/20: no falls    Time  2    Period  Weeks    Status  Achieved    Target Date  11/06/18        PT Long Term Goals - 05/06/19 0001      PT LONG TERM GOAL #1   Title  Patient will perform TUG in 40 seconds or less with walker to improve safe negotiation of environment.     Baseline  12/29: 41 seconds with RW 11/5: 45 seconds 10/9: deferred due to pain 5/26: 61 seconds 6/17: 60 seconds 7/20: 59 seconds 8/26:  55 seconds    Time  12    Period  Weeks    Status  Partially Met    Target Date  07/29/19      PT LONG TERM GOAL #2   Title  Pt's 9mT will improve to at least 0.5 m/s for improved safety ambulating in home    Baseline  12/29: 0.31 m/s 03/13/19: 0.30 m/s 0.14 m/s; 5/15: .264m  7/10:  .36 m/s  8/20: .42 m/s  9/26: 0.263m10/14: .26 m/s improved hip and knee clearance 11/15: 0.44m50mmproved hip and knee clearance; 12/5: .29 m/s improved step length and forward moment: decreased foot drag. 05/09/2018: 0.21 m/s 2/6: .237 m/s 5/26: .23 m/s 6/17: .24 m/s  7/20: 0.26 m/s 8/26: 0.26 m/s 10/8: 0.28 m/s    Time  12    Period  Weeks    Status  Partially Met    Target Date  07/29/19      PT LONG TERM GOAL #3   Title  Pt's Bil hip F strength will improve to at least 2+/5 BLEs for improved gait mechanics and safety    Baseline  12/29:  Hips 2+/5 abduction/adduction, flexion 2/5  Knees 3+/5  11/5: Hips: 2/5 knees 3+/5 10/8: deferred due to pain L: 2/5, R: 1/5 7/10: 2/5 bilaterally  8/20: 2/5 bilat 9/26: 2/5 bilat 10/14: 2/5 bilaterally ,improved hip flexor muscle activation 2/5: improved hip flexor and quad activation: 12/5: 2/5 ; 05/09/2018: 2/5 2/6: 3/5 knee, 2 to 2+/5 hip 5/26: 2-/5 hips 3/5 knees 6/17: 2-/5 hips 3/5 knees    Time  12    Period  Weeks    Status  Partially Met    Target Date  07/29/19      PT LONG TERM GOAL #4   Title  Pt's ABC Scale will improve to at least 40% to demonstrate improved balance    Baseline  12/29: 34.7% 11/5: 32.2% 10/9: 32.1% 28.13% 5/15; 24.4% 7/10:  23.4% 8/20: 22. 2% 9/26: 25.3% 10/14: 26.5% 11/15: 24.38% 12/5: 26%; 05/09/2018: 29% 2/6: 23.75% 526: 30% 6/17: 26.8%  7/2: 28% 8/26: 30.3%    Time  12    Period  Weeks    Status  Partially Met    Target Date  07/29/19      PT LONG TERM GOAL #5   Title  Patient will negotiate one step ~4 inches to enter house independently to increase functional independence in natural environment    Baseline  12/29: 1.9 inch lift after multiple attempts11/5: able to lift 1.75 inch 10/9: has lifted 1.5 inch in previous sessions /20: unable to do 9/26: patient too fatigued/not feeling well to perform 10/14: unable to perform at this time 11/15: unable to perform at this time; 12/5: able to lift 1 inch; 05/09/2018: unable to  clear 4" step, able to lift foot ~ 1in 5/26: has been unable to perform 6/17: able to lift foot 1.25 inch 8/26: has been able to step up onto 1.5 inch step in previous sessions    Time  12    Period  Weeks    Status  Partially Met    Target Date  07/29/19      PT LONG TERM GOAL #6   Title  Patient will negotiate 30 ft of changing surfaces outside such as pavement and parking lot to allow patient to walk into church.    Baseline  12/29: ambulate to cart for stores, too cold to perform for PT this session but has performed multiple times at home11/5; walks outside to cart at store ~20 ft 10/9: walks 20 ft outside at store now 12/5: does not walk outside 2/24: unable to test due to rain 5/26: unable since COVID 6/17: raining 7/20 too hot to test 8/26: unable to walk outside due to heat and COVID    Time  8    Period  Weeks    Status  Achieved      PT LONG TERM GOAL #8   Title  Patient will negotiate tub with mod I to be able to bath/shower in bathroom requiring stability and ability to negotiate small area.     Baseline  12/29: unable to step over current step safely; has to sponge bath standing up in room 11/5: has to sponge bath standing up in room     Time  12    Period  Weeks    Status  On-going    Target Date  07/29/19            Plan - 06/03/19 1719    Clinical Impression Statement  Patient presents with good motivation with physical therapy despite fatigue. Due to fatigue decreased interventions in standing performed however patient is able to tolerate seated interventions with occasional rest breaks. Stabilization of trunk with pertubation's is improving. The patient will continue to benefit from skilled therapeutic intervention to address deficits in strength, mobility, balance, and function for improved overall QOL and safety    Rehab Potential  Good    PT Frequency  2x / week    PT Duration  12 weeks    PT Treatment/Interventions  ADLs/Self Care Home Management;Aquatic  Therapy;Biofeedback;Cryotherapy;Electrical Stimulation;Iontophoresis 101m/ml Dexamethasone;Traction;Moist Heat;Ultrasound;DME Instruction;Gait training;Stair training;Functional mobility training;Therapeutic activities;Therapeutic exercise;Balance training;Neuromuscular re-education;Patient/family education;Orthotic Fit/Training;Wheelchair mobility training;Manual techniques;Compression bandaging;Passive range of motion;Dry needling;Energy conservation;Taping;Splinting    PT Next Visit Plan  strength/balance    PT Home Exercise Plan  to initiate next session (please incorporate hip flexor and adductor stretches  in HEP if possible)    Consulted and Agree with Plan of Care  Patient       Patient will benefit from skilled therapeutic intervention in order to improve the following deficits and impairments:  Abnormal gait, Decreased activity tolerance, Decreased balance, Decreased coordination, Decreased endurance, Decreased knowledge of use of DME, Decreased mobility, Decreased range of motion, Decreased safety awareness, Decreased strength, Difficulty walking, Impaired perceived functional ability, Impaired sensation, Impaired tone, Impaired UE functional use, Improper body mechanics, Postural dysfunction  Visit Diagnosis: Muscle weakness (generalized)  Other abnormalities of gait and mobility  Unsteadiness on feet     Problem List Patient Active Problem List   Diagnosis Date Noted  . Abscess of female pelvis   . SVT (supraventricular tachycardia) ([place])   . Radial styloid tenosynovitis 03/12/2018  . Wheelchair confinement 02/27/2018  . Localized osteoporosis with current pathological fracture with routine healing 01/19/2017  . Wrist fracture 01/16/2017  . Sprain of ankle 03/23/2016  . Closed fracture of lateral malleolus 03/16/2016  . Health care maintenance 01/24/2016  . Blood pressure elevated without history of HTN 10/25/2015  . Essential hypertension 10/25/2015  . Multiple sclerosis  ([place]) 10/02/2015  . Chronic left shoulder pain 07/19/2015  . Multiple sclerosis exacerbation ([place]) 07/14/2015  . MS (multiple sclerosis) ([place]) 11/26/2014  . Increased body mass index 11/26/2014  . HPV test positive 11/26/2014  . Status post laparoscopic supracervical hysterectomy 11/26/2014  . Galactorrhea 11/26/2014  . Back ache 05/21/2014  . Adiposity 05/21/2014  . Disordered sleep 05/21/2014  . Muscle spasticity 05/21/2014  . Spasticity 05/21/2014  . Calculus of kidney 12/09/2013  . Renal colic 94/70/9628  . Hypercholesteremia 08/19/2013  . Hereditary and idiopathic neuropathy 08/19/2013  . Hypercholesterolemia without hypertriglyceridemia 08/19/2013  . Bladder infection, chronic 07/25/2012  . Disorder of bladder function 07/25/2012  . Incomplete bladder emptying 07/25/2012  . Microscopic hematuria 07/25/2012  . Right upper quadrant pain 07/25/2012   Janna Arch, PT, DPT   06/03/2019, 5:21 PM  [place] MAIN [facility] SERVICES [address] [place], [place], [address] Phone: [phone]   Fax:  [phone]  Name: Lisa Williams MRN: [id number] Date of Birth: June 22, 1960

## 2019-06-05 ENCOUNTER — Ambulatory Visit: Payer: PPO

## 2019-06-10 ENCOUNTER — Other Ambulatory Visit: Payer: Self-pay

## 2019-06-10 ENCOUNTER — Ambulatory Visit: Payer: PPO | Attending: Neurology

## 2019-06-10 DIAGNOSIS — R2681 Unsteadiness on feet: Secondary | ICD-10-CM | POA: Diagnosis not present

## 2019-06-10 DIAGNOSIS — R2689 Other abnormalities of gait and mobility: Secondary | ICD-10-CM | POA: Insufficient documentation

## 2019-06-10 DIAGNOSIS — M6281 Muscle weakness (generalized): Secondary | ICD-10-CM | POA: Diagnosis not present

## 2019-06-10 NOTE — Therapy (Signed)
[place] MAIN [facility] SERVICES [address] [place], [place], [address] Phone: [phone]   Fax:  [phone]  Physical Therapy Treatment  Patient Details  Name: Lisa Williams MRN: [id number] Date of Birth: 19-Jan-1961 No data recorded  Encounter Date: 06/10/2019  PT End of Session - 06/10/19 1125    Visit Number  148    Number of Visits  164    Date for PT Re-Evaluation  07/29/19    Authorization Type  8/10 PN 05/06/19    PT Start Time  1029    PT Stop Time  1111    PT Time Calculation (min)  42 min    Equipment Utilized During Treatment  Gait belt    Activity Tolerance  Patient tolerated treatment well;Other (comment)   heat intolerance   Behavior During Therapy  WFL for tasks assessed/performed       Past Medical History:  Diagnosis Date  . Abdominal pain, right upper quadrant   . Back pain   . Calculus of kidney 12/09/2013  . Chronic back pain    unspecified  . Chronic left shoulder pain 07/19/2015  . Functional disorder of bladder    other  . Galactorrhea 11/26/2014   Chronic   . Hereditary and idiopathic neuropathy 08/19/2013  . HPV test positive   . Hypercholesteremia 08/19/2013  . Incomplete bladder emptying   . Microscopic hematuria   . MS (multiple sclerosis) ([place])   . Muscle spasticity 05/21/2014  . Nonspecific findings on examination of urine    other  . Osteopenia   . Status post laparoscopic supracervical hysterectomy 11/26/2014  . Tobacco user 11/26/2014  . Wrist fracture     Past Surgical History:  Procedure Laterality Date  . bilateral tubal ligation  1996  . BREAST CYST EXCISION Left 2002  . KNEE SURGERY     right  . LAPAROSCOPIC SUPRACERVICAL HYSTERECTOMY  08/05/2013  . ORIF WRIST FRACTURE Left 01/17/2017   Procedure: OPEN REDUCTION INTERNAL FIXATION (ORIF) WRIST FRACTURE;  Surgeon: Lovell Sheehan, MD;  Location: [facility] ORS;  Service: Orthopedics;  Laterality: Left;  . TUBAL LIGATION Bilateral   .  VAGINAL HYSTERECTOMY  03/2006    There were no vitals filed for this visit.  Subjective Assessment - 06/10/19 1122    Subjective  Patient reports compliance with HEP. Had a good weekend but was primarily inside due to the weather. No falls or LOB since last session.    Patient is accompained by:  Family member    Pertinent History  Pt is a 59 y/o F who presents with BLE weakness and difficulty ambulating due to h/o MS that was diagnosed in 1995. Pt denies any pain associated. Pt was admitted to the hospital in November 2018 due to MS exacerbation. Nov-Dec to HHPT-->[facility] until end of Jan. After rehab the pt was able to ambulate up to 100 ft with the RW. Pt has Bil AFO, her therapist at [facility] thinks she needs a new AFO as her feet still drag when she ambulates, pt reports HHPT mentioned a spring AFO. Pt has custom AFOs that were originally made 4 years ago. Goals: improve balance, to be able to get into the shower with her tub bench without assist, to be able to get up 2 steps at sister's home with railings on both sides but too far apart to reach both sides, to be able to drive adapted car. Pt is staying at other sister's house with one step to  get inside and currently picking up L leg with UEs and then stepping in. Pt steps down leading with LLE. Pt wants to work on walking side to side as this is challenging and she had not yet progressed to this at rehab. Pt able to get in/out the car with assist only to bring BLEs into the car. Hoping to drive by June or July with adapted car (already has one). Pt needs assist bringing LEs into and out of hospital bed. Pt able to perform sit>stand without assist from [facility] with a pillow on the seat for a boost. Pt denies any falls in the past 6 months. Pt has been increasing her time alone at home up to a few hours at a time to become more independent. Pt has both manual and power WC. Uses power WC at home, uses manual WC out in community. Pt able to self  propel manual WC. Pt needs assist putting pants around ankles but she can pull them up. Pt can put on her shoes but needs help getting her leg up to do so. Pt able to tie her shoelaces on her own. Pt has a tub/shower unit at home with a tub bench. No grab bars in the shower. Has hand held shower head. Pt currently taking a sponge bath. Pt has a regular height toilet with a BSC over top. Pt with h/o L wrist fx with no precautions, pt wore her soft brace to evaluation for comfort but reports her wrist has healed and is doing well following OT.     Limitations  Lifting;Standing;Walking;House hold activities    How long can you sit comfortably?  n/a    How long can you stand comfortably?  3 minutes without UE assist, limited by fatigue    How long can you walk comfortably?  100 ft, limited by fatigue    Patient Stated Goals  see above    Currently in Pain?  No/denies           Treatment: Standing in // bars:    weight shifts L, R with focus on hip shift rather than pulling with UE's 10x each side Ambulate distance of // bars 2x length with cueing for widened step length and foot clearance with repetition.  [place]o ball transfer with single UE  Support, 2x each UE, more challenge with LUE than right Lateral stepping with single limb at a time, BUE support 5x each LE, increased ability to clear foot bilaterally.     Seated;  BRB lat pulls 15x with PT holding opp end RTB ER pulls with cueing for elbows at side. 15x  RTB D2 PNF pattern pulling with PT assistance for hand placement 10x each LE RTB bicep curls 12x each LE     Isometrics: -hip flexion into PT hands 12x 3 second holds each LE, single LE at a time -hip abduction into PT hands 12x 3 second holds  -hip adduction into PT hands 12x 3 second holds        Seated rest breaks and use of ice pack utilized to keep body temperature at functional range to reduce MS exacerbation.      Pt educated throughout session about proper posture and  technique with exercises. Improved exercise technique, movement at target joints, use of target muscles after min to mod verbal, visual, tactile cues.                       PT Education - 06/10/19 1123  Education provided  Yes    Education Details  exercise technique, body mechanics    Person(s) Educated  Patient    Methods  Explanation;Demonstration;Tactile cues;Verbal cues    Comprehension  Verbalized understanding;Returned demonstration;Verbal cues required;Tactile cues required;Need further instruction       PT Short Term Goals - 03/13/19 1733      PT SHORT TERM GOAL #1   Title  Patient will be compliant with HEP 4-5 days/week for improved carryover between sessions.     Baseline  HEP compliancy between sessions 6/17 compliant    Time  2    Period  Weeks    Status  Achieved    Target Date  10/15/18      PT SHORT TERM GOAL #2   Title  Patient will report no falls/LOB to indicate improved stability with mobility     Baseline  6/17: no falls, one episode of instability 7/20: no falls    Time  2    Period  Weeks    Status  Achieved    Target Date  11/06/18        PT Long Term Goals - 05/06/19 0001      PT LONG TERM GOAL #1   Title  Patient will perform TUG in 40 seconds or less with walker to improve safe negotiation of environment.     Baseline  12/29: 41 seconds with RW 11/5: 45 seconds 10/9: deferred due to pain 5/26: 61 seconds 6/17: 60 seconds 7/20: 59 seconds 8/26:  55 seconds    Time  12    Period  Weeks    Status  Partially Met    Target Date  07/29/19      PT LONG TERM GOAL #2   Title  Pt's 41mT will improve to at least 0.5 m/s for improved safety ambulating in home    Baseline  12/29: 0.31 m/s 03/13/19: 0.30 m/s 0.14 m/s; 5/15: .282m  7/10: .36 m/s  8/20: .42 m/s  9/26: 0.2661m10/14: .26 m/s improved hip and knee clearance 11/15: 0.12m90mmproved hip and knee clearance; 12/5: .29 m/s improved step length and forward moment: decreased foot  drag. 05/09/2018: 0.21 m/s 2/6: .237 m/s 5/26: .23 m/s 6/17: .24 m/s  7/20: 0.26 m/s 8/26: 0.26 m/s 10/8: 0.28 m/s    Time  12    Period  Weeks    Status  Partially Met    Target Date  07/29/19      PT LONG TERM GOAL #3   Title  Pt's Bil hip F strength will improve to at least 2+/5 BLEs for improved gait mechanics and safety    Baseline  12/29:  Hips 2+/5 abduction/adduction, flexion 2/5  Knees 3+/5  11/5: Hips: 2/5 knees 3+/5 10/8: deferred due to pain L: 2/5, R: 1/5 7/10: 2/5 bilaterally  8/20: 2/5 bilat 9/26: 2/5 bilat 10/14: 2/5 bilaterally ,improved hip flexor muscle activation 2/5: improved hip flexor and quad activation: 12/5: 2/5 ; 05/09/2018: 2/5 2/6: 3/5 knee, 2 to 2+/5 hip 5/26: 2-/5 hips 3/5 knees 6/17: 2-/5 hips 3/5 knees    Time  12    Period  Weeks    Status  Partially Met    Target Date  07/29/19      PT LONG TERM GOAL #4   Title  Pt's ABC Scale will improve to at least 40% to demonstrate improved balance    Baseline  12/29: 34.7% 11/5: 32.2% 10/9: 32.1% 28.13% 5/15; 24.4% 7/10: 23.4% 8/20: 22.  2% 9/26: 25.3% 10/14: 26.5% 11/15: 24.38% 12/5: 26%; 05/09/2018: 29% 2/6: 23.75% 526: 30% 6/17: 26.8%  7/2: 28% 8/26: 30.3%    Time  12    Period  Weeks    Status  Partially Met    Target Date  07/29/19      PT LONG TERM GOAL #5   Title  Patient will negotiate one step ~4 inches to enter house independently to increase functional independence in natural environment    Baseline  12/29: 1.9 inch lift after multiple attempts11/5: able to lift 1.75 inch 10/9: has lifted 1.5 inch in previous sessions /20: unable to do 9/26: patient too fatigued/not feeling well to perform 10/14: unable to perform at this time 11/15: unable to perform at this time; 12/5: able to lift 1 inch; 05/09/2018: unable to clear 4" step, able to lift foot ~ 1in 5/26: has been unable to perform 6/17: able to lift foot 1.25 inch 8/26: has been able to step up onto 1.5 inch step in previous sessions    Time  12    Period  Weeks     Status  Partially Met    Target Date  07/29/19      PT LONG TERM GOAL #6   Title  Patient will negotiate 30 ft of changing surfaces outside such as pavement and parking lot to allow patient to walk into church.    Baseline  12/29: ambulate to cart for stores, too cold to perform for PT this session but has performed multiple times at home11/5; walks outside to cart at store ~20 ft 10/9: walks 20 ft outside at store now 12/5: does not walk outside 2/24: unable to test due to rain 5/26: unable since COVID 6/17: raining 7/20 too hot to test 8/26: unable to walk outside due to heat and COVID    Time  8    Period  Weeks    Status  Achieved      PT LONG TERM GOAL #8   Title  Patient will negotiate tub with mod I to be able to bath/shower in bathroom requiring stability and ability to negotiate small area.     Baseline  12/29: unable to step over current step safely; has to sponge bath standing up in room 11/5: has to sponge bath standing up in room     Time  12    Period  Weeks    Status  On-going    Target Date  07/29/19            Plan - 06/10/19 1125    Clinical Impression Statement  Patient presents to physical therapy with excellent motivation.  She continues to progress with stability in standing with decreased UE support with dynamic motions/reaches. She demonstrated ability to perform lateral steps with foot clearance. The patient will continue to benefit from skilled therapeutic intervention to address deficits in strength, mobility, balance, and function for improved overall QOL and safety    Rehab Potential  Good    PT Frequency  2x / week    PT Duration  12 weeks    PT Treatment/Interventions  ADLs/Self Care Home Management;Aquatic Therapy;Biofeedback;Cryotherapy;Electrical Stimulation;Iontophoresis 33m/ml Dexamethasone;Traction;Moist Heat;Ultrasound;DME Instruction;Gait training;Stair training;Functional mobility training;Therapeutic activities;Therapeutic exercise;Balance  training;Neuromuscular re-education;Patient/family education;Orthotic Fit/Training;Wheelchair mobility training;Manual techniques;Compression bandaging;Passive range of motion;Dry needling;Energy conservation;Taping;Splinting    PT Next Visit Plan  strength/balance    PT Home Exercise Plan  to initiate next session (please incorporate hip flexor and adductor stretches in HEP if possible)  Consulted and Agree with Plan of Care  Patient       Patient will benefit from skilled therapeutic intervention in order to improve the following deficits and impairments:  Abnormal gait, Decreased activity tolerance, Decreased balance, Decreased coordination, Decreased endurance, Decreased knowledge of use of DME, Decreased mobility, Decreased range of motion, Decreased safety awareness, Decreased strength, Difficulty walking, Impaired perceived functional ability, Impaired sensation, Impaired tone, Impaired UE functional use, Improper body mechanics, Postural dysfunction  Visit Diagnosis: Muscle weakness (generalized)  Other abnormalities of gait and mobility  Unsteadiness on feet     Problem List Patient Active Problem List   Diagnosis Date Noted  . Abscess of female pelvis   . SVT (supraventricular tachycardia) ([place])   . Radial styloid tenosynovitis 03/12/2018  . Wheelchair confinement 02/27/2018  . Localized osteoporosis with current pathological fracture with routine healing 01/19/2017  . Wrist fracture 01/16/2017  . Sprain of ankle 03/23/2016  . Closed fracture of lateral malleolus 03/16/2016  . Health care maintenance 01/24/2016  . Blood pressure elevated without history of HTN 10/25/2015  . Essential hypertension 10/25/2015  . Multiple sclerosis ([place]) 10/02/2015  . Chronic left shoulder pain 07/19/2015  . Multiple sclerosis exacerbation ([place]) 07/14/2015  . MS (multiple sclerosis) ([place]) 11/26/2014  . Increased body mass index 11/26/2014  . HPV test positive 11/26/2014  . Status post  laparoscopic supracervical hysterectomy 11/26/2014  . Galactorrhea 11/26/2014  . Back ache 05/21/2014  . Adiposity 05/21/2014  . Disordered sleep 05/21/2014  . Muscle spasticity 05/21/2014  . Spasticity 05/21/2014  . Calculus of kidney 12/09/2013  . Renal colic 45/14/6047  . Hypercholesteremia 08/19/2013  . Hereditary and idiopathic neuropathy 08/19/2013  . Hypercholesterolemia without hypertriglyceridemia 08/19/2013  . Bladder infection, chronic 07/25/2012  . Disorder of bladder function 07/25/2012  . Incomplete bladder emptying 07/25/2012  . Microscopic hematuria 07/25/2012  . Right upper quadrant pain 07/25/2012   Janna Arch, PT, DPT   06/10/2019, 11:28 AM  [place] MAIN [facility] SERVICES [address] [place], [place], [address] Phone: [phone]   Fax:  [phone]  Name: Lisa Williams MRN: [id number] Date of Birth: 27-May-1960

## 2019-06-12 ENCOUNTER — Other Ambulatory Visit: Payer: Self-pay

## 2019-06-12 ENCOUNTER — Ambulatory Visit: Payer: PPO

## 2019-06-12 DIAGNOSIS — R2689 Other abnormalities of gait and mobility: Secondary | ICD-10-CM

## 2019-06-12 DIAGNOSIS — R2681 Unsteadiness on feet: Secondary | ICD-10-CM

## 2019-06-12 DIAGNOSIS — M6281 Muscle weakness (generalized): Secondary | ICD-10-CM

## 2019-06-12 NOTE — Therapy (Signed)
[place] MAIN [facility] SERVICES [address] [place], [place], [address] Phone: [phone]   Fax:  [phone]  Physical Therapy Treatment  Patient Details  Name: Lisa Williams MRN: [id number] Date of Birth: 1960-06-14 No data recorded  Encounter Date: 06/12/2019  PT End of Session - 06/12/19 1204    Visit Number  [id number]    Number of Visits  164    Date for PT Re-Evaluation  07/29/19    Authorization Type  9/10 PN 05/06/19    PT Start Time  1030    PT Stop Time  1113    PT Time Calculation (min)  43 min    Equipment Utilized During Treatment  Gait belt    Activity Tolerance  Patient tolerated treatment well;Other (comment)   heat intolerance   Behavior During Therapy  WFL for tasks assessed/performed       Past Medical History:  Diagnosis Date  . Abdominal pain, right upper quadrant   . Back pain   . Calculus of kidney 12/09/2013  . Chronic back pain    unspecified  . Chronic left shoulder pain 07/19/2015  . Functional disorder of bladder    other  . Galactorrhea 11/26/2014   Chronic   . Hereditary and idiopathic neuropathy 08/19/2013  . HPV test positive   . Hypercholesteremia 08/19/2013  . Incomplete bladder emptying   . Microscopic hematuria   . MS (multiple sclerosis) ([place])   . Muscle spasticity 05/21/2014  . Nonspecific findings on examination of urine    other  . Osteopenia   . Status post laparoscopic supracervical hysterectomy 11/26/2014  . Tobacco user 11/26/2014  . Wrist fracture     Past Surgical History:  Procedure Laterality Date  . bilateral tubal ligation  1996  . BREAST CYST EXCISION Left 2002  . KNEE SURGERY     right  . LAPAROSCOPIC SUPRACERVICAL HYSTERECTOMY  08/05/2013  . ORIF WRIST FRACTURE Left 01/17/2017   Procedure: OPEN REDUCTION INTERNAL FIXATION (ORIF) WRIST FRACTURE;  Surgeon: Lovell Sheehan, MD;  Location: [facility] ORS;  Service: Orthopedics;  Laterality: Left;  . TUBAL LIGATION Bilateral   .  VAGINAL HYSTERECTOMY  03/2006    There were no vitals filed for this visit.  Subjective Assessment - 06/12/19 1203    Subjective  Patient reports having a good couple days, was able to run multiple errands yesterday without fatigue. No falls or LOB since last session, has been compliant with HEP.    Patient is accompained by:  Family member    Pertinent History  Pt is a 59 y/o F who presents with BLE weakness and difficulty ambulating due to h/o MS that was diagnosed in 1995. Pt denies any pain associated. Pt was admitted to the hospital in November 2018 due to MS exacerbation. Nov-Dec to HHPT-->[facility] until end of Jan. After rehab the pt was able to ambulate up to 100 ft with the RW. Pt has Bil AFO, her therapist at [place] thinks she needs a new AFO as her feet still drag when she ambulates, pt reports HHPT mentioned a spring AFO. Pt has custom AFOs that were originally made 4 years ago. Goals: improve balance, to be able to get into the shower with her tub bench without assist, to be able to get up 2 steps at sister's home with railings on both sides but too far apart to reach both sides, to be able to drive adapted car. Pt is staying at other sister's house  with one step to get inside and currently picking up L leg with UEs and then stepping in. Pt steps down leading with LLE. Pt wants to work on walking side to side as this is challenging and she had not yet progressed to this at rehab. Pt able to get in/out the car with assist only to bring BLEs into the car. Hoping to drive by June or July with adapted car (already has one). Pt needs assist bringing LEs into and out of hospital bed. Pt able to perform sit>stand without assist from [facility] with a pillow on the seat for a boost. Pt denies any falls in the past 6 months. Pt has been increasing her time alone at home up to a few hours at a time to become more independent. Pt has both manual and power WC. Uses power WC at home, uses manual WC out in  community. Pt able to self propel manual WC. Pt needs assist putting pants around ankles but she can pull them up. Pt can put on her shoes but needs help getting her leg up to do so. Pt able to tie her shoelaces on her own. Pt has a tub/shower unit at home with a tub bench. No grab bars in the shower. Has hand held shower head. Pt currently taking a sponge bath. Pt has a regular height toilet with a BSC over top. Pt with h/o L wrist fx with no precautions, pt wore her soft brace to evaluation for comfort but reports her wrist has healed and is doing well following OT.     Limitations  Lifting;Standing;Walking;House hold activities    How long can you sit comfortably?  n/a    How long can you stand comfortably?  3 minutes without UE assist, limited by fatigue    How long can you walk comfortably?  100 ft, limited by fatigue    Patient Stated Goals  see above    Currently in Pain?  No/denies            Standing with RW: Standing weight shifts L and R with focus on gluteal/hip shift rather than pull with UE's. 15x each LE  RTB around knees, stand up,step forward into modified tandem hold 10-15 seconds then return to neutral position and sit down. 2x each LE, first time patient has ever used resistance bands with stepping    Ambulate >50 ft with RW and CGA with w/c follow. Weaving around obstacles and patients with good foot clearance bilaterally and decreased shuffle steps.   Seated: Memory interactive game between PT and SPT; dance reproducing second interactive movement focusing on dynamic movements challenging core, trunk, and UE's, x 8 minutes with one rest break. Very fatiguing to patient.    Seated rest breaks and use of ice pack utilized to keep body temperature at functional range to reduce MS exacerbation.  Pt educated throughout session about proper posture and technique with exercises. Improved exercise technique, movement at target joints, use of target muscles after min to  mod verbal, visual, tactile cues.             PT Education - 06/12/19 1203    Education provided  Yes    Education Details  exercise technique body mechanics    Person(s) Educated  Patient    Methods  Explanation;Demonstration;Tactile cues;Verbal cues    Comprehension  Verbalized understanding;Returned demonstration;Verbal cues required;Tactile cues required       PT Short Term Goals - 03/13/19 1733  PT SHORT TERM GOAL #1   Title  Patient will be compliant with HEP 4-5 days/week for improved carryover between sessions.     Baseline  HEP compliancy between sessions 6/17 compliant    Time  2    Period  Weeks    Status  Achieved    Target Date  10/15/18      PT SHORT TERM GOAL #2   Title  Patient will report no falls/LOB to indicate improved stability with mobility     Baseline  6/17: no falls, one episode of instability 7/20: no falls    Time  2    Period  Weeks    Status  Achieved    Target Date  11/06/18        PT Long Term Goals - 05/06/19 0001      PT LONG TERM GOAL #1   Title  Patient will perform TUG in 40 seconds or less with walker to improve safe negotiation of environment.     Baseline  12/29: 41 seconds with RW 11/5: 45 seconds 10/9: deferred due to pain 5/26: 61 seconds 6/17: 60 seconds 7/20: 59 seconds 8/26:  55 seconds    Time  12    Period  Weeks    Status  Partially Met    Target Date  07/29/19      PT LONG TERM GOAL #2   Title  Pt's 14mT will improve to at least 0.5 m/s for improved safety ambulating in home    Baseline  12/29: 0.31 m/s 03/13/19: 0.30 m/s 0.14 m/s; 5/15: .235m  7/10: .36 m/s  8/20: .42 m/s  9/26: 0.2639m10/14: .26 m/s improved hip and knee clearance 11/15: 0.35m74mmproved hip and knee clearance; 12/5: .29 m/s improved step length and forward moment: decreased foot drag. 05/09/2018: 0.21 m/s 2/6: .237 m/s 5/26: .23 m/s 6/17: .24 m/s  7/20: 0.26 m/s 8/26: 0.26 m/s 10/8: 0.28 m/s    Time  12    Period  Weeks    Status   Partially Met    Target Date  07/29/19      PT LONG TERM GOAL #3   Title  Pt's Bil hip F strength will improve to at least 2+/5 BLEs for improved gait mechanics and safety    Baseline  12/29:  Hips 2+/5 abduction/adduction, flexion 2/5  Knees 3+/5  11/5: Hips: 2/5 knees 3+/5 10/8: deferred due to pain L: 2/5, R: 1/5 7/10: 2/5 bilaterally  8/20: 2/5 bilat 9/26: 2/5 bilat 10/14: 2/5 bilaterally ,improved hip flexor muscle activation 2/5: improved hip flexor and quad activation: 12/5: 2/5 ; 05/09/2018: 2/5 2/6: 3/5 knee, 2 to 2+/5 hip 5/26: 2-/5 hips 3/5 knees 6/17: 2-/5 hips 3/5 knees    Time  12    Period  Weeks    Status  Partially Met    Target Date  07/29/19      PT LONG TERM GOAL #4   Title  Pt's ABC Scale will improve to at least 40% to demonstrate improved balance    Baseline  12/29: 34.7% 11/5: 32.2% 10/9: 32.1% 28.13% 5/15; 24.4% 7/10: 23.4% 8/20: 22. 2% 9/26: 25.3% 10/14: 26.5% 11/15: 24.38% 12/5: 26%; 05/09/2018: 29% 2/6: 23.75% 526: 30% 6/17: 26.8%  7/2: 28% 8/26: 30.3%    Time  12    Period  Weeks    Status  Partially Met    Target Date  07/29/19      PT LONG TERM GOAL #5   Title  Patient will negotiate one step ~4 inches to enter house independently to increase functional independence in natural environment    Baseline  12/29: 1.9 inch lift after multiple attempts11/5: able to lift 1.75 inch 10/9: has lifted 1.5 inch in previous sessions /20: unable to do 9/26: patient too fatigued/not feeling well to perform 10/14: unable to perform at this time 11/15: unable to perform at this time; 12/5: able to lift 1 inch; 05/09/2018: unable to clear 4" step, able to lift foot ~ 1in 5/26: has been unable to perform 6/17: able to lift foot 1.25 inch 8/26: has been able to step up onto 1.5 inch step in previous sessions    Time  12    Period  Weeks    Status  Partially Met    Target Date  07/29/19      PT LONG TERM GOAL #6   Title  Patient will negotiate 30 ft of changing surfaces outside such as  pavement and parking lot to allow patient to walk into church.    Baseline  12/29: ambulate to cart for stores, too cold to perform for PT this session but has performed multiple times at home11/5; walks outside to cart at store ~20 ft 10/9: walks 20 ft outside at store now 12/5: does not walk outside 2/24: unable to test due to rain 5/26: unable since COVID 6/17: raining 7/20 too hot to test 8/26: unable to walk outside due to heat and COVID    Time  8    Period  Weeks    Status  Achieved      PT LONG TERM GOAL #8   Title  Patient will negotiate tub with mod I to be able to bath/shower in bathroom requiring stability and ability to negotiate small area.     Baseline  12/29: unable to step over current step safely; has to sponge bath standing up in room 11/5: has to sponge bath standing up in room     Time  12    Period  Weeks    Status  On-going    Target Date  07/29/19            Plan - 06/12/19 1307    Clinical Impression Statement  Patient is demonstrating progression of strength as can be seen in ability to perform stepping in standing with resistance band for first time. Prolonged pertubation's to trunk stability was fatiguing to patient by end of session. The patient will continue to benefit from skilled therapeutic intervention to address deficits in strength, mobility, balance, and function for improved overall QOL and safety    Rehab Potential  Good    PT Frequency  2x / week    PT Duration  12 weeks    PT Treatment/Interventions  ADLs/Self Care Home Management;Aquatic Therapy;Biofeedback;Cryotherapy;Electrical Stimulation;Iontophoresis 76m/ml Dexamethasone;Traction;Moist Heat;Ultrasound;DME Instruction;Gait training;Stair training;Functional mobility training;Therapeutic activities;Therapeutic exercise;Balance training;Neuromuscular re-education;Patient/family education;Orthotic Fit/Training;Wheelchair mobility training;Manual techniques;Compression bandaging;Passive range of  motion;Dry needling;Energy conservation;Taping;Splinting    PT Next Visit Plan  progress note    PT Home Exercise Plan  to initiate next session (please incorporate hip flexor and adductor stretches in HEP if possible)    Consulted and Agree with Plan of Care  Patient       Patient will benefit from skilled therapeutic intervention in order to improve the following deficits and impairments:  Abnormal gait, Decreased activity tolerance, Decreased balance, Decreased coordination, Decreased endurance, Decreased knowledge of use of DME, Decreased mobility, Decreased range of motion, Decreased safety awareness,  Decreased strength, Difficulty walking, Impaired perceived functional ability, Impaired sensation, Impaired tone, Impaired UE functional use, Improper body mechanics, Postural dysfunction  Visit Diagnosis: Muscle weakness (generalized)  Other abnormalities of gait and mobility  Unsteadiness on feet     Problem List Patient Active Problem List   Diagnosis Date Noted  . Abscess of female pelvis   . SVT (supraventricular tachycardia) ([place])   . Radial styloid tenosynovitis 03/12/2018  . Wheelchair confinement 02/27/2018  . Localized osteoporosis with current pathological fracture with routine healing 01/19/2017  . Wrist fracture 01/16/2017  . Sprain of ankle 03/23/2016  . Closed fracture of lateral malleolus 03/16/2016  . Health care maintenance 01/24/2016  . Blood pressure elevated without history of HTN 10/25/2015  . Essential hypertension 10/25/2015  . Multiple sclerosis ([place]) 10/02/2015  . Chronic left shoulder pain 07/19/2015  . Multiple sclerosis exacerbation ([place]) 07/14/2015  . MS (multiple sclerosis) ([place]) 11/26/2014  . Increased body mass index 11/26/2014  . HPV test positive 11/26/2014  . Status post laparoscopic supracervical hysterectomy 11/26/2014  . Galactorrhea 11/26/2014  . Back ache 05/21/2014  . Adiposity 05/21/2014  . Disordered sleep 05/21/2014  . Muscle  spasticity 05/21/2014  . Spasticity 05/21/2014  . Calculus of kidney 12/09/2013  . Renal colic 71/24/5809  . Hypercholesteremia 08/19/2013  . Hereditary and idiopathic neuropathy 08/19/2013  . Hypercholesterolemia without hypertriglyceridemia 08/19/2013  . Bladder infection, chronic 07/25/2012  . Disorder of bladder function 07/25/2012  . Incomplete bladder emptying 07/25/2012  . Microscopic hematuria 07/25/2012  . Right upper quadrant pain 07/25/2012    Janna Arch, PT, DPT   06/12/2019, 1:09 PM  [place] MAIN [facility] SERVICES [address] [place], [place], [address] Phone: [phone]   Fax:  [phone]  Name: Lisa Williams MRN: [id number] Date of Birth: [birth year]

## 2019-06-17 ENCOUNTER — Ambulatory Visit: Payer: PPO

## 2019-06-17 ENCOUNTER — Other Ambulatory Visit: Payer: Self-pay

## 2019-06-17 DIAGNOSIS — R2689 Other abnormalities of gait and mobility: Secondary | ICD-10-CM

## 2019-06-17 DIAGNOSIS — R2681 Unsteadiness on feet: Secondary | ICD-10-CM

## 2019-06-17 DIAGNOSIS — M6281 Muscle weakness (generalized): Secondary | ICD-10-CM

## 2019-06-17 NOTE — Therapy (Signed)
[place] MAIN [facility] SERVICES [address] [place], [place], [address] Phone: [phone]   Fax:  [phone]  Physical Therapy Treatment Physical Therapy Progress Note   Dates of reporting period  05/06/19   to  06/17/19  Patient Details  Name: Lisa Williams MRN: [id number] Date of Birth: 25-Jan-1961 No data recorded  Encounter Date: 06/17/2019  PT End of Session - 06/17/19 1142    Visit Number  150    Number of Visits  164    Date for PT Re-Evaluation  07/29/19    Authorization Type  10/10 PN 05/06/19    PT Start Time  1026    PT Stop Time  1106    PT Time Calculation (min)  40 min    Equipment Utilized During Treatment  Gait belt    Activity Tolerance  Patient tolerated treatment well;Other (comment)   heat intolerance   Behavior During Therapy  WFL for tasks assessed/performed       Past Medical History:  Diagnosis Date  . Abdominal pain, right upper quadrant   . Back pain   . Calculus of kidney 12/09/2013  . Chronic back pain    unspecified  . Chronic left shoulder pain 07/19/2015  . Functional disorder of bladder    other  . Galactorrhea 11/26/2014   Chronic   . Hereditary and idiopathic neuropathy 08/19/2013  . HPV test positive   . Hypercholesteremia 08/19/2013  . Incomplete bladder emptying   . Microscopic hematuria   . MS (multiple sclerosis) ([place])   . Muscle spasticity 05/21/2014  . Nonspecific findings on examination of urine    other  . Osteopenia   . Status post laparoscopic supracervical hysterectomy 11/26/2014  . Tobacco user 11/26/2014  . Wrist fracture     Past Surgical History:  Procedure Laterality Date  . bilateral tubal ligation  1996  . BREAST CYST EXCISION Left 2002  . KNEE SURGERY     right  . LAPAROSCOPIC SUPRACERVICAL HYSTERECTOMY  08/05/2013  . ORIF WRIST FRACTURE Left 01/17/2017   Procedure: OPEN REDUCTION INTERNAL FIXATION (ORIF) WRIST FRACTURE;  Surgeon: Lovell Sheehan, MD;  Location:  [facility] ORS;  Service: Orthopedics;  Laterality: Left;  . TUBAL LIGATION Bilateral   . VAGINAL HYSTERECTOMY  03/2006    There were no vitals filed for this visit.  Subjective Assessment - 06/17/19 1140    Subjective  Patient reports no falls or LOB since last session and reports no pain today.    Patient is accompained by:  Family member    Pertinent History  Pt is a 59 y/o F who presents with BLE weakness and difficulty ambulating due to h/o MS that was diagnosed in 1995. Pt denies any pain associated. Pt was admitted to the hospital in November 2018 due to MS exacerbation. Nov-Dec to HHPT-->[facility] until end of Jan. After rehab the pt was able to ambulate up to 100 ft with the RW. Pt has Bil AFO, her therapist at [place] thinks she needs a new AFO as her feet still drag when she ambulates, pt reports HHPT mentioned a spring AFO. Pt has custom AFOs that were originally made 4 years ago. Goals: improve balance, to be able to get into the shower with her tub bench without assist, to be able to get up 2 steps at sister's home with railings on both sides but too far apart to reach both sides, to be able to drive adapted car. Pt is staying at  other sister's house with one step to get inside and currently picking up L leg with UEs and then stepping in. Pt steps down leading with LLE. Pt wants to work on walking side to side as this is challenging and she had not yet progressed to this at rehab. Pt able to get in/out the car with assist only to bring BLEs into the car. Hoping to drive by June or July with adapted car (already has one). Pt needs assist bringing LEs into and out of hospital bed. Pt able to perform sit>stand without assist from [facility] with a pillow on the seat for a boost. Pt denies any falls in the past 6 months. Pt has been increasing her time alone at home up to a few hours at a time to become more independent. Pt has both manual and power WC. Uses power WC at home, uses manual WC out in  community. Pt able to self propel manual WC. Pt needs assist putting pants around ankles but she can pull them up. Pt can put on her shoes but needs help getting her leg up to do so. Pt able to tie her shoelaces on her own. Pt has a tub/shower unit at home with a tub bench. No grab bars in the shower. Has hand held shower head. Pt currently taking a sponge bath. Pt has a regular height toilet with a BSC over top. Pt with h/o L wrist fx with no precautions, pt wore her soft brace to evaluation for comfort but reports her wrist has healed and is doing well following OT.     Limitations  Lifting;Standing;Walking;House hold activities    How long can you sit comfortably?  n/a    How long can you stand comfortably?  3 minutes without UE assist, limited by fatigue    How long can you walk comfortably?  100 ft, limited by fatigue    Patient Stated Goals  see above    Currently in Pain?  No/denies    Multiple Pain Sites  No       All tests followed by seated rest break with ice pack:  25mwalk test with walker, WC follow: trial 1 44 sec, trial 2 38.3 sec (0.26 m/s), one episode of shooting pain on R ribs upon standing that relieved quickly and one near LOB midway through 1st trial  TUG with walker: trial 1 42 sec, trial 2 36 sec, 1st trial hindered by PT holding onto walker  MMT seated in WC: hip abd/add 2+/5, hip flex 2/5, knee flex 3/5, knee ext 3+/5, DF 1/5, PF 2/5  ABC: 30.6%  Step height deferred  Bathroom mobility: patient unable to negotiate tub yet at this time.   Patient reported with excellent motivation. Gait speed has increased since beginning PT, and mechanics have improved. She continues to be challenged with LE strength and standing balance tasks. Patient's condition has the potential to improve in response to therapy. Maximum improvement is yet to be obtained. Patient would continue to benefit from skilled PT to increase LE strength, mobility, and stability in order to improve QOL,  function, and safety with ADLs.     Patient's condition has the potential to improve in response to therapy. Maximum improvement is yet to be obtained. The anticipated improvement is attainable and reasonable in a generally predictable time. Patient reports feeling more steady and being able to do ADLs with increased independence. She reports having difficulty transferring into bath/shower and wanting to continue PT to continue  progress.               PT Short Term Goals - 03/13/19 1733      PT SHORT TERM GOAL #1   Title  Patient will be compliant with HEP 4-5 days/week for improved carryover between sessions.     Baseline  HEP compliancy between sessions 6/17 compliant    Time  2    Period  Weeks    Status  Achieved    Target Date  10/15/18      PT SHORT TERM GOAL #2   Title  Patient will report no falls/LOB to indicate improved stability with mobility     Baseline  6/17: no falls, one episode of instability 7/20: no falls    Time  2    Period  Weeks    Status  Achieved    Target Date  11/06/18        PT Long Term Goals - 06/17/19 0001      PT LONG TERM GOAL #1   Title  Patient will perform TUG in 40 seconds or less with walker to improve safe negotiation of environment.     Baseline  2/9: 38 sec with RW 12/29: 41 seconds with RW 11/5: 45 seconds 10/9: deferred due to pain 5/26: 61 seconds 6/17: 60 seconds 7/20: 59 seconds 8/26:  55 seconds    Time  12    Period  Weeks    Status  Achieved      PT LONG TERM GOAL #2   Title  Pt's 41mT will improve to at least 0.5 m/s for improved safety ambulating in home    Baseline  2/9: 0.263m 12/29: 0.31 m/s 03/13/19: 0.30 m/s 0.14 m/s; 5/15: .2610m 7/10: .36 m/s  8/20: .42 m/s  9/26: 0.41m56m0/14: .26 m/s improved hip and knee clearance 11/15: 0.64m/24mproved hip and knee clearance; 12/5: .29 m/s improved step length and forward moment: decreased foot drag. 05/09/2018: 0.21 m/s 2/6: .237 m/s 5/26: .23 m/s 6/17: .24 m/s  7/20:  0.26 m/s 8/26: 0.26 m/s 10/8: 0.28 m/s    Time  12    Period  Weeks    Status  Partially Met    Target Date  08/12/19      PT LONG TERM GOAL #3   Title  Pt's Bil hip F strength will improve to at least 2+/5 BLEs for improved gait mechanics and safety    Baseline  2/9: Hip abd/add 2+/5, hip flex 2/5, knee flex 3/5, knee ext 3+/5; 12/29:  Hips 2+/5 abduction/adduction, flexion 2/5  Knees 3+/5  11/5: Hips: 2/5 knees 3+/5 10/8: deferred due to pain L: 2/5, R: 1/5 7/10: 2/5 bilaterally  8/20: 2/5 bilat 9/26: 2/5 bilat 10/14: 2/5 bilaterally ,improved hip flexor muscle activation 2/5: improved hip flexor and quad activation: 12/5: 2/5 ; 05/09/2018: 2/5 2/6: 3/5 knee, 2 to 2+/5 hip 5/26: 2-/5 hips 3/5 knees 6/17: 2-/5 hips 3/5 knees    Time  12    Period  Weeks    Status  Partially Met    Target Date  08/12/19      PT LONG TERM GOAL #4   Title  Pt's ABC Scale will improve to at least 40% to demonstrate improved balance    Baseline  2/9: 30.6% 12/29: 34.7% 11/5: 32.2% 10/9: 32.1% 28.13% 5/15; 24.4% 7/10: 23.4% 8/20: 22. 2% 9/26: 25.3% 10/14: 26.5% 11/15: 24.38% 12/5: 26%; 05/09/2018: 29% 2/6: 23.75% 526: 30% 6/17: 26.8%  7/2: 28%  8/26: 30.3%    Time  12    Period  Weeks    Status  Partially Met    Target Date  09/09/19      PT LONG TERM GOAL #5   Title  Patient will negotiate one step ~4 inches to enter house independently to increase functional independence in natural environment    Baseline  12/29: 1.9 inch lift after multiple attempts11/5: able to lift 1.75 inch 10/9: has lifted 1.5 inch in previous sessions /20: unable to do 9/26: patient too fatigued/not feeling well to perform 10/14: unable to perform at this time 11/15: unable to perform at this time; 12/5: able to lift 1 inch; 05/09/2018: unable to clear 4" step, able to lift foot ~ 1in 5/26: has been unable to perform 6/17: able to lift foot 1.25 inch 8/26: has been able to step up onto 1.5 inch step in previous sessions    Time  12    Period   Weeks    Status  Deferred      PT LONG TERM GOAL #8   Title  Patient will negotiate tub with mod I to be able to bath/shower in bathroom requiring stability and ability to negotiate small area.     Status  Unable to assess            Plan - 06/17/19 1153    Clinical Impression Statement  Patient reported with excellent motivation. Gait speed has increased since beginning PT, and mechanics have improved. She continues to be challenged with LE strength and standing balance tasks. Patient's condition has the potential to improve in response to therapy. Maximum improvement is yet to be obtained. Patient would continue to benefit from skilled PT to increase LE strength, mobility, and stability in order to improve QOL, function, and safety with ADLs.    Rehab Potential  Good    PT Frequency  2x / week    PT Duration  12 weeks    PT Treatment/Interventions  ADLs/Self Care Home Management;Aquatic Therapy;Biofeedback;Cryotherapy;Electrical Stimulation;Iontophoresis 42m/ml Dexamethasone;Traction;Moist Heat;Ultrasound;DME Instruction;Gait training;Stair training;Functional mobility training;Therapeutic activities;Therapeutic exercise;Balance training;Neuromuscular re-education;Patient/family education;Orthotic Fit/Training;Wheelchair mobility training;Manual techniques;Compression bandaging;Passive range of motion;Dry needling;Energy conservation;Taping;Splinting    PT Next Visit Plan  progress note    PT Home Exercise Plan  to initiate next session (please incorporate hip flexor and adductor stretches in HEP if possible)    Consulted and Agree with Plan of Care  Patient       Patient will benefit from skilled therapeutic intervention in order to improve the following deficits and impairments:  Abnormal gait, Decreased activity tolerance, Decreased balance, Decreased coordination, Decreased endurance, Decreased knowledge of use of DME, Decreased mobility, Decreased range of motion, Decreased safety  awareness, Decreased strength, Difficulty walking, Impaired perceived functional ability, Impaired sensation, Impaired tone, Impaired UE functional use, Improper body mechanics, Postural dysfunction  Visit Diagnosis: No diagnosis found.     Problem List Patient Active Problem List   Diagnosis Date Noted  . Abscess of female pelvis   . SVT (supraventricular tachycardia) ([facility]   . Radial styloid tenosynovitis 03/12/2018  . Wheelchair confinement 02/27/2018  . Localized osteoporosis with current pathological fracture with routine healing 01/19/2017  . Wrist fracture 01/16/2017  . Sprain of ankle 03/23/2016  . Closed fracture of lateral malleolus 03/16/2016  . Health care maintenance 01/24/2016  . Blood pressure elevated without history of HTN 10/25/2015  . Essential hypertension 10/25/2015  . Multiple sclerosis ([place] 10/02/2015  . Chronic left shoulder pain 07/19/2015  .  Multiple sclerosis exacerbation ([place]) 07/14/2015  . MS (multiple sclerosis) ([place]) 11/26/2014  . Increased body mass index 11/26/2014  . HPV test positive 11/26/2014  . Status post laparoscopic supracervical hysterectomy 11/26/2014  . Galactorrhea 11/26/2014  . Back ache 05/21/2014  . Adiposity 05/21/2014  . Disordered sleep 05/21/2014  . Muscle spasticity 05/21/2014  . Spasticity 05/21/2014  . Calculus of kidney 12/09/2013  . Renal colic 11/01/9483  . Hypercholesteremia 08/19/2013  . Hereditary and idiopathic neuropathy 08/19/2013  . Hypercholesterolemia without hypertriglyceridemia 08/19/2013  . Bladder infection, chronic 07/25/2012  . Disorder of bladder function 07/25/2012  . Incomplete bladder emptying 07/25/2012  . Microscopic hematuria 07/25/2012  . Right upper quadrant pain 07/25/2012    Florinda Marker, SPT  This entire session was performed under direct supervision and direction of a licensed therapist/therapist assistant . I have personally read, edited and approve of the note as written.  Janna Arch, PT, DPT   06/17/2019, 12:31 PM  [place] MAIN [facility] SERVICES [address] [place], [place], [address] Phone: [phone]   Fax:  [phone]  Name: Ranell Finelli Scheaffer MRN: [id number] Date of Birth: 1960/08/21

## 2019-06-19 ENCOUNTER — Ambulatory Visit: Payer: PPO

## 2019-06-19 ENCOUNTER — Other Ambulatory Visit: Payer: Self-pay

## 2019-06-19 DIAGNOSIS — M6281 Muscle weakness (generalized): Secondary | ICD-10-CM | POA: Diagnosis not present

## 2019-06-19 DIAGNOSIS — R2681 Unsteadiness on feet: Secondary | ICD-10-CM

## 2019-06-19 DIAGNOSIS — R2689 Other abnormalities of gait and mobility: Secondary | ICD-10-CM

## 2019-06-19 NOTE — Therapy (Signed)
[place] MAIN [facility] SERVICES [address] [place], [place], [address] Phone: [phone]   Fax:  [phone]  Physical Therapy Treatment  Patient Details  Name: Lisa Williams MRN: [id number] Date of Birth: [birth year] No data recorded  Encounter Date: 06/19/2019  PT End of Session - 06/19/19 1047    Visit Number  [id number]    Number of Visits  164    Date for PT Re-Evaluation  07/29/19    Authorization Type  10/10 PN 05/06/19; next session 2/10 PN 06/17/19    PT Start Time  1026    PT Stop Time  1107    PT Time Calculation (min)  41 min    Equipment Utilized During Treatment  Gait belt    Activity Tolerance  Patient tolerated treatment well;Other (comment)   heat intolerance   Behavior During Therapy  WFL for tasks assessed/performed       Past Medical History:  Diagnosis Date  . Abdominal pain, right upper quadrant   . Back pain   . Calculus of kidney 12/09/2013  . Chronic back pain    unspecified  . Chronic left shoulder pain 07/19/2015  . Functional disorder of bladder    other  . Galactorrhea 11/26/2014   Chronic   . Hereditary and idiopathic neuropathy 08/19/2013  . HPV test positive   . Hypercholesteremia 08/19/2013  . Incomplete bladder emptying   . Microscopic hematuria   . MS (multiple sclerosis) ([place])   . Muscle spasticity 05/21/2014  . Nonspecific findings on examination of urine    other  . Osteopenia   . Status post laparoscopic supracervical hysterectomy 11/26/2014  . Tobacco user 11/26/2014  . Wrist fracture     Past Surgical History:  Procedure Laterality Date  . bilateral tubal ligation  1996  . BREAST CYST EXCISION Left 2002  . KNEE SURGERY     right  . LAPAROSCOPIC SUPRACERVICAL HYSTERECTOMY  08/05/2013  . ORIF WRIST FRACTURE Left 01/17/2017   Procedure: OPEN REDUCTION INTERNAL FIXATION (ORIF) WRIST FRACTURE;  Surgeon: Lovell Sheehan, MD;  Location: [facility] ORS;  Service: Orthopedics;  Laterality: Left;  .  TUBAL LIGATION Bilateral   . VAGINAL HYSTERECTOMY  03/2006    There were no vitals filed for this visit.  Therapeutic Exercise: (Seated rest breaks with ice pack between exercise sets) Tic tac toe, standing in // bars with CGA, 3x with seated rest break in between each game, for standing with SUE support   Basketball into hoop, standing in // bars with CGA, 3 min total with rest breaks in seated between  Dynamic stability challenging in standing: Dancing in // bars with CGA, alternating BUE support and no UE support, involving low LE kicks for foot clearance and hip flexion as well as weight shifting between LEs for weight acceptance, 2 min total broken up with rest breaks  Seated dancing, UE motion including shoulder flexion/crossbody reaches/abduction/scapular elevation, ~2 min, for endurance and UE/core strength with COM changes      Patient reported with excellent motivation and tolerated PT well. She demonstrated progression in stability and endurance, as noted with increased exercises in standing in // bars and for longer durations. Patient was able to stand in // bars without UE support for a brief amount of time, and was able to perform low cross-body kicks with foot clearance. She required seated rest breaks with ice pack between exercise sets with fatigue. This patient will continue to benefit from skilled PT intervention in  order to build upon improvements in stability, strength, and coordination in order to improve QOL and safety during ADLs.         PT Education - 06/19/19 1021    Education provided  Yes    Education Details  exercise technique, body mechanics    Person(s) Educated  Patient    Methods  Explanation;Demonstration;Tactile cues;Verbal cues    Comprehension  Verbalized understanding;Returned demonstration;Verbal cues required;Tactile cues required       PT Short Term Goals - 03/13/19 1733      PT SHORT TERM GOAL #1   Title  Patient will be compliant  with HEP 4-5 days/week for improved carryover between sessions.     Baseline  HEP compliancy between sessions 6/17 compliant    Time  2    Period  Weeks    Status  Achieved    Target Date  10/15/18      PT SHORT TERM GOAL #2   Title  Patient will report no falls/LOB to indicate improved stability with mobility     Baseline  6/17: no falls, one episode of instability 7/20: no falls    Time  2    Period  Weeks    Status  Achieved    Target Date  11/06/18        PT Long Term Goals - 06/17/19 0001      PT LONG TERM GOAL #1   Title  Patient will perform TUG in 40 seconds or less with walker to improve safe negotiation of environment.     Baseline  2/9: 38 sec with RW 12/29: 41 seconds with RW 11/5: 45 seconds 10/9: deferred due to pain 5/26: 61 seconds 6/17: 60 seconds 7/20: 59 seconds 8/26:  55 seconds    Time  12    Period  Weeks    Status  Achieved      PT LONG TERM GOAL #2   Title  Pt's 13mT will improve to at least 0.5 m/s for improved safety ambulating in home    Baseline  2/9: 0.243m 12/29: 0.31 m/s 03/13/19: 0.30 m/s 0.14 m/s; 5/15: .2642m 7/10: .36 m/s  8/20: .42 m/s  9/26: 0.46m53m0/14: .26 m/s improved hip and knee clearance 11/15: 0.72m/14mproved hip and knee clearance; 12/5: .29 m/s improved step length and forward moment: decreased foot drag. 05/09/2018: 0.21 m/s 2/6: .237 m/s 5/26: .23 m/s 6/17: .24 m/s  7/20: 0.26 m/s 8/26: 0.26 m/s 10/8: 0.28 m/s    Time  12    Period  Weeks    Status  Partially Met    Target Date  08/12/19      PT LONG TERM GOAL #3   Title  Pt's Bil hip F strength will improve to at least 2+/5 BLEs for improved gait mechanics and safety    Baseline  2/9: Hip abd/add 2+/5, hip flex 2/5, knee flex 3/5, knee ext 3+/5; 12/29:  Hips 2+/5 abduction/adduction, flexion 2/5  Knees 3+/5  11/5: Hips: 2/5 knees 3+/5 10/8: deferred due to pain L: 2/5, R: 1/5 7/10: 2/5 bilaterally  8/20: 2/5 bilat 9/26: 2/5 bilat 10/14: 2/5 bilaterally ,improved hip flexor  muscle activation 2/5: improved hip flexor and quad activation: 12/5: 2/5 ; 05/09/2018: 2/5 2/6: 3/5 knee, 2 to 2+/5 hip 5/26: 2-/5 hips 3/5 knees 6/17: 2-/5 hips 3/5 knees    Time  12    Period  Weeks    Status  Partially Met    Target Date  08/12/19      PT LONG TERM GOAL #4   Title  Pt's ABC Scale will improve to at least 40% to demonstrate improved balance    Baseline  2/9: 30.6% 12/29: 34.7% 11/5: 32.2% 10/9: 32.1% 28.13% 5/15; 24.4% 7/10: 23.4% 8/20: 22. 2% 9/26: 25.3% 10/14: 26.5% 11/15: 24.38% 12/5: 26%; 05/09/2018: 29% 2/6: 23.75% 526: 30% 6/17: 26.8%  7/2: 28% 8/26: 30.3%    Time  12    Period  Weeks    Status  Partially Met    Target Date  09/09/19      PT LONG TERM GOAL #5   Title  Patient will negotiate one step ~4 inches to enter house independently to increase functional independence in natural environment    Baseline  12/29: 1.9 inch lift after multiple attempts11/5: able to lift 1.75 inch 10/9: has lifted 1.5 inch in previous sessions /20: unable to do 9/26: patient too fatigued/not feeling well to perform 10/14: unable to perform at this time 11/15: unable to perform at this time; 12/5: able to lift 1 inch; 05/09/2018: unable to clear 4" step, able to lift foot ~ 1in 5/26: has been unable to perform 6/17: able to lift foot 1.25 inch 8/26: has been able to step up onto 1.5 inch step in previous sessions    Time  12    Period  Weeks    Status  Deferred      PT LONG TERM GOAL #8   Title  Patient will negotiate tub with mod I to be able to bath/shower in bathroom requiring stability and ability to negotiate small area.     Status  Unable to assess            Plan - 06/19/19 1120    Clinical Impression Statement  Patient reported with excellent motivation and tolerated PT well. She demonstrated progression in stability and endurance, as noted with increased exercises in standing in // bars and for longer durations. Patient was able to stand in // bars without UE support for a  brief amount of time, and was able to perform low cross-body kicks with foot clearance. She required seated rest breaks with ice pack between exercise sets with fatigue. This patient will continue to benefit from skilled PT intervention in order to build upon improvements in stability, strength, and coordination in order to improve QOL and safety during ADLs.    Rehab Potential  Good    PT Frequency  2x / week    PT Duration  12 weeks    PT Treatment/Interventions  ADLs/Self Care Home Management;Aquatic Therapy;Biofeedback;Cryotherapy;Electrical Stimulation;Iontophoresis 47m/ml Dexamethasone;Traction;Moist Heat;Ultrasound;DME Instruction;Gait training;Stair training;Functional mobility training;Therapeutic activities;Therapeutic exercise;Balance training;Neuromuscular re-education;Patient/family education;Orthotic Fit/Training;Wheelchair mobility training;Manual techniques;Compression bandaging;Passive range of motion;Dry needling;Energy conservation;Taping;Splinting    PT Next Visit Plan  progress note    PT Home Exercise Plan  to initiate next session (please incorporate hip flexor and adductor stretches in HEP if possible)    Consulted and Agree with Plan of Care  Patient       Patient will benefit from skilled therapeutic intervention in order to improve the following deficits and impairments:  Abnormal gait, Decreased activity tolerance, Decreased balance, Decreased coordination, Decreased endurance, Decreased knowledge of use of DME, Decreased mobility, Decreased range of motion, Decreased safety awareness, Decreased strength, Difficulty walking, Impaired perceived functional ability, Impaired sensation, Impaired tone, Impaired UE functional use, Improper body mechanics, Postural dysfunction  Visit Diagnosis: Muscle weakness (generalized)  Other abnormalities of gait and mobility  Unsteadiness  on feet     Problem List Patient Active Problem List   Diagnosis Date Noted  . Abscess of  female pelvis   . SVT (supraventricular tachycardia) ([place])   . Radial styloid tenosynovitis 03/12/2018  . Wheelchair confinement 02/27/2018  . Localized osteoporosis with current pathological fracture with routine healing 01/19/2017  . Wrist fracture 01/16/2017  . Sprain of ankle 03/23/2016  . Closed fracture of lateral malleolus 03/16/2016  . Health care maintenance 01/24/2016  . Blood pressure elevated without history of HTN 10/25/2015  . Essential hypertension 10/25/2015  . Multiple sclerosis ([place]) 10/02/2015  . Chronic left shoulder pain 07/19/2015  . Multiple sclerosis exacerbation ([place]) 07/14/2015  . MS (multiple sclerosis) ([place]) 11/26/2014  . Increased body mass index 11/26/2014  . HPV test positive 11/26/2014  . Status post laparoscopic supracervical hysterectomy 11/26/2014  . Galactorrhea 11/26/2014  . Back ache 05/21/2014  . Adiposity 05/21/2014  . Disordered sleep 05/21/2014  . Muscle spasticity 05/21/2014  . Spasticity 05/21/2014  . Calculus of kidney 12/09/2013  . Renal colic 93/40/6840  . Hypercholesteremia 08/19/2013  . Hereditary and idiopathic neuropathy 08/19/2013  . Hypercholesterolemia without hypertriglyceridemia 08/19/2013  . Bladder infection, chronic 07/25/2012  . Disorder of bladder function 07/25/2012  . Incomplete bladder emptying 07/25/2012  . Microscopic hematuria 07/25/2012  . Right upper quadrant pain 07/25/2012    Florinda Marker, SPT This entire session was performed under direct supervision and direction of a licensed therapist/therapist assistant . I have personally read, edited and approve of the note as written.  Janna Arch, PT, DPT   06/19/2019, 11:30 AM  [place] MAIN [facility] SERVICES [address] [place], [place], [address] Phone: [phone]   Fax:  [phone]  Name: Lisa Williams MRN: [id number] Date of Birth: [birth year]

## 2019-06-24 ENCOUNTER — Other Ambulatory Visit: Payer: Self-pay

## 2019-06-24 ENCOUNTER — Ambulatory Visit: Payer: PPO

## 2019-06-24 DIAGNOSIS — R2681 Unsteadiness on feet: Secondary | ICD-10-CM

## 2019-06-24 DIAGNOSIS — M6281 Muscle weakness (generalized): Secondary | ICD-10-CM | POA: Diagnosis not present

## 2019-06-24 DIAGNOSIS — R2689 Other abnormalities of gait and mobility: Secondary | ICD-10-CM

## 2019-06-24 NOTE — Therapy (Signed)
[place] MAIN [facility] SERVICES [place], [place], [address] Phone: [phone]   Fax:  [phone]  Physical Therapy Treatment  Patient Details  Name: Lisa Williams MRN: [id number] Date of Birth: Nov 09, 1960 No data recorded  Encounter Date: 06/24/2019  PT End of Session - 06/24/19 1118    Visit Number  [id number]    Number of Visits  164    Date for PT Re-Evaluation  07/29/19    Authorization Type  3/10 PN 06/17/19    PT Start Time  1030    PT Stop Time  1108    PT Time Calculation (min)  38 min    Equipment Utilized During Treatment  Gait belt    Activity Tolerance  Patient tolerated treatment well;Other (comment)   heat intolerance   Behavior During Therapy  WFL for tasks assessed/performed       Past Medical History:  Diagnosis Date  . Abdominal pain, right upper quadrant   . Back pain   . Calculus of kidney 12/09/2013  . Chronic back pain    unspecified  . Chronic left shoulder pain 07/19/2015  . Functional disorder of bladder    other  . Galactorrhea 11/26/2014   Chronic   . Hereditary and idiopathic neuropathy 08/19/2013  . HPV test positive   . Hypercholesteremia 08/19/2013  . Incomplete bladder emptying   . Microscopic hematuria   . MS (multiple sclerosis) ([place])   . Muscle spasticity 05/21/2014  . Nonspecific findings on examination of urine    other  . Osteopenia   . Status post laparoscopic supracervical hysterectomy 11/26/2014  . Tobacco user 11/26/2014  . Wrist fracture     Past Surgical History:  Procedure Laterality Date  . bilateral tubal ligation  1996  . BREAST CYST EXCISION Left 2002  . KNEE SURGERY     right  . LAPAROSCOPIC SUPRACERVICAL HYSTERECTOMY  08/05/2013  . ORIF WRIST FRACTURE Left 01/17/2017   Procedure: OPEN REDUCTION INTERNAL FIXATION (ORIF) WRIST FRACTURE;  Surgeon: Lovell Sheehan, MD;  Location: [facility] ORS;  Service: Orthopedics;  Laterality: Left;  . TUBAL LIGATION Bilateral   .  VAGINAL HYSTERECTOMY  03/2006    There were no vitals filed for this visit.  Subjective Assessment - 06/24/19 1224    Subjective  Patient reports no falls/LOB since last session. No pain today.    Patient is accompained by:  Family member    Pertinent History  Pt is a 59 y/o F who presents with BLE weakness and difficulty ambulating due to h/o MS that was diagnosed in 1995. Pt denies any pain associated. Pt was admitted to the hospital in November 2018 due to MS exacerbation. Nov-Dec to HHPT-->[facility] until end of Jan. After rehab the pt was able to ambulate up to 100 ft with the RW. Pt has Bil AFO, her therapist at [facility] thinks she needs a new AFO as her feet still drag when she ambulates, pt reports HHPT mentioned a spring AFO. Pt has custom AFOs that were originally made 4 years ago. Goals: improve balance, to be able to get into the shower with her tub bench without assist, to be able to get up 2 steps at sister's home with railings on both sides but too far apart to reach both sides, to be able to drive adapted car. Pt is staying at other sister's house with one step to get inside and currently picking up L leg with UEs and then stepping in.  Pt steps down leading with LLE. Pt wants to work on walking side to side as this is challenging and she had not yet progressed to this at rehab. Pt able to get in/out the car with assist only to bring BLEs into the car. Hoping to drive by June or July with adapted car (already has one). Pt needs assist bringing LEs into and out of hospital bed. Pt able to perform sit>stand without assist from [facility] with a pillow on the seat for a boost. Pt denies any falls in the past 6 months. Pt has been increasing her time alone at home up to a few hours at a time to become more independent. Pt has both manual and power WC. Uses power WC at home, uses manual WC out in community. Pt able to self propel manual WC. Pt needs assist putting pants around ankles but she can pull  them up. Pt can put on her shoes but needs help getting her leg up to do so. Pt able to tie her shoelaces on her own. Pt has a tub/shower unit at home with a tub bench. No grab bars in the shower. Has hand held shower head. Pt currently taking a sponge bath. Pt has a regular height toilet with a BSC over top. Pt with h/o L wrist fx with no precautions, pt wore her soft brace to evaluation for comfort but reports her wrist has healed and is doing well following OT.     Limitations  Lifting;Standing;Walking;House hold activities    How long can you sit comfortably?  n/a    How long can you stand comfortably?  3 minutes without UE assist, limited by fatigue    How long can you walk comfortably?  100 ft, limited by fatigue    Patient Stated Goals  see above     Seated rest breaks with ice pack as needed with fatigue   Therapeutic Exercise: Seated rainbow ball bounce (center, lateral, crossbody) and against wall, 2 min with seated rest breaks with ice pack, for coordination and reaction speed  Seated leg press into Bosu on ground, 10x each LE  Seated bilateral calf raises with range allowed by orthotic, 10x  Seated rainbow ball adduction, 10x  Seated ball roll out and in, 10x, for core/back extensor activation, cueing for L shoulder depression  // bars, CGA: Standing hip shift, alternating BUE and no UE support, for LE weight acceptance prior to standing/walking exercises, 30 sec  Step up to airex, BUE support, 10x total each LE, monitoring for fatigue/over-exertion      Pt reported with excellent motivation and tolerated PT well. She was able to perform exercises for longer duration today, as displayed with airex step ups and the seated ball bounce. She was able to recruit proper musculature for Bosu leg press and able to achieve allowed ankle ROM with seated calf raises. She was also able to stand without UE support for a brief time. Pt continues to be challenged with stability and LE  strength and mobility. She requires seated rest breaks with ice pack when having difficulty with thermoregulation. Pt will continue to benefit from skilled PT intervention to build upon improvements in strength, balance, and coordination in order to improve QOL and safety.        PT Education - 06/24/19 1022    Education provided  Yes    Education Details  exercise technique, body mechanics    Person(s) Educated  Patient    Methods  Explanation;Demonstration;Tactile cues;Verbal  cues    Comprehension  Verbalized understanding;Returned demonstration;Verbal cues required;Tactile cues required       PT Short Term Goals - 03/13/19 1733      PT SHORT TERM GOAL #1   Title  Patient will be compliant with HEP 4-5 days/week for improved carryover between sessions.     Baseline  HEP compliancy between sessions 6/17 compliant    Time  2    Period  Weeks    Status  Achieved    Target Date  10/15/18      PT SHORT TERM GOAL #2   Title  Patient will report no falls/LOB to indicate improved stability with mobility     Baseline  6/17: no falls, one episode of instability 7/20: no falls    Time  2    Period  Weeks    Status  Achieved    Target Date  11/06/18        PT Long Term Goals - 06/17/19 0001      PT LONG TERM GOAL #1   Title  Patient will perform TUG in 40 seconds or less with walker to improve safe negotiation of environment.     Baseline  2/9: 38 sec with RW 12/29: 41 seconds with RW 11/5: 45 seconds 10/9: deferred due to pain 5/26: 61 seconds 6/17: 60 seconds 7/20: 59 seconds 8/26:  55 seconds    Time  12    Period  Weeks    Status  Achieved      PT LONG TERM GOAL #2   Title  Pt's 41mT will improve to at least 0.5 m/s for improved safety ambulating in home    Baseline  2/9: 0.273m 12/29: 0.31 m/s 03/13/19: 0.30 m/s 0.14 m/s; 5/15: .2634m 7/10: .36 m/s  8/20: .42 m/s  9/26: 0.16m27m0/14: .26 m/s improved hip and knee clearance 11/15: 0.41m/73mproved hip and knee clearance;  12/5: .29 m/s improved step length and forward moment: decreased foot drag. 05/09/2018: 0.21 m/s 2/6: .237 m/s 5/26: .23 m/s 6/17: .24 m/s  7/20: 0.26 m/s 8/26: 0.26 m/s 10/8: 0.28 m/s    Time  12    Period  Weeks    Status  Partially Met    Target Date  08/12/19      PT LONG TERM GOAL #3   Title  Pt's Bil hip F strength will improve to at least 2+/5 BLEs for improved gait mechanics and safety    Baseline  2/9: Hip abd/add 2+/5, hip flex 2/5, knee flex 3/5, knee ext 3+/5; 12/29:  Hips 2+/5 abduction/adduction, flexion 2/5  Knees 3+/5  11/5: Hips: 2/5 knees 3+/5 10/8: deferred due to pain L: 2/5, R: 1/5 7/10: 2/5 bilaterally  8/20: 2/5 bilat 9/26: 2/5 bilat 10/14: 2/5 bilaterally ,improved hip flexor muscle activation 2/5: improved hip flexor and quad activation: 12/5: 2/5 ; 05/09/2018: 2/5 2/6: 3/5 knee, 2 to 2+/5 hip 5/26: 2-/5 hips 3/5 knees 6/17: 2-/5 hips 3/5 knees    Time  12    Period  Weeks    Status  Partially Met    Target Date  08/12/19      PT LONG TERM GOAL #4   Title  Pt's ABC Scale will improve to at least 40% to demonstrate improved balance    Baseline  2/9: 30.6% 12/29: 34.7% 11/5: 32.2% 10/9: 32.1% 28.13% 5/15; 24.4% 7/10: 23.4% 8/20: 22. 2% 9/26: 25.3% 10/14: 26.5% 11/15: 24.38% 12/5: 26%; 05/09/2018: 29% 2/6: 23.75% 526: 30% 6/17:  26.8%  7/2: 28% 8/26: 30.3%    Time  12    Period  Weeks    Status  Partially Met    Target Date  09/09/19      PT LONG TERM GOAL #5   Title  Patient will negotiate one step ~4 inches to enter house independently to increase functional independence in natural environment    Baseline  12/29: 1.9 inch lift after multiple attempts11/5: able to lift 1.75 inch 10/9: has lifted 1.5 inch in previous sessions /20: unable to do 9/26: patient too fatigued/not feeling well to perform 10/14: unable to perform at this time 11/15: unable to perform at this time; 12/5: able to lift 1 inch; 05/09/2018: unable to clear 4" step, able to lift foot ~ 1in 5/26: has been  unable to perform 6/17: able to lift foot 1.25 inch 8/26: has been able to step up onto 1.5 inch step in previous sessions    Time  12    Period  Weeks    Status  Deferred      PT LONG TERM GOAL #8   Title  Patient will negotiate tub with mod I to be able to bath/shower in bathroom requiring stability and ability to negotiate small area.     Status  Unable to assess            Plan - 06/24/19 1128    Clinical Impression Statement  Pt reported with excellent motivation and tolerated PT well. She was able to perform exercises for longer duration today, as displayed with airex step ups and the seated ball bounce. She was able to recruit proper musculature for Bosu leg press and able to achieve allowed ankle ROM with seated calf raises. She was also able to stand without UE support for a brief time. Pt continues to be challenged with stability and LE strength and mobility. She requires seated rest breaks with ice pack when having MS exacerbation. Pt will continue to benefit from skilled PT intervention to build upon improvements in strength, balance, and coordination in order to improve QOL and safety.    Rehab Potential  Good    PT Frequency  2x / week    PT Duration  12 weeks    PT Treatment/Interventions  ADLs/Self Care Home Management;Aquatic Therapy;Biofeedback;Cryotherapy;Electrical Stimulation;Iontophoresis 9m/ml Dexamethasone;Traction;Moist Heat;Ultrasound;DME Instruction;Gait training;Stair training;Functional mobility training;Therapeutic activities;Therapeutic exercise;Balance training;Neuromuscular re-education;Patient/family education;Orthotic Fit/Training;Wheelchair mobility training;Manual techniques;Compression bandaging;Passive range of motion;Dry needling;Energy conservation;Taping;Splinting    PT Next Visit Plan  progress note    PT Home Exercise Plan  to initiate next session (please incorporate hip flexor and adductor stretches in HEP if possible)    Consulted and Agree with  Plan of Care  Patient       Patient will benefit from skilled therapeutic intervention in order to improve the following deficits and impairments:  Abnormal gait, Decreased activity tolerance, Decreased balance, Decreased coordination, Decreased endurance, Decreased knowledge of use of DME, Decreased mobility, Decreased range of motion, Decreased safety awareness, Decreased strength, Difficulty walking, Impaired perceived functional ability, Impaired sensation, Impaired tone, Impaired UE functional use, Improper body mechanics, Postural dysfunction  Visit Diagnosis: Muscle weakness (generalized)  Other abnormalities of gait and mobility  Unsteadiness on feet     Problem List Patient Active Problem List   Diagnosis Date Noted  . Abscess of female pelvis   . [place]   . Radial styloid tenosynovitis 03/12/2018  . Wheelchair confinement 02/27/2018  . Localized osteoporosis with current pathological fracture  with routine healing 01/19/2017  . Wrist fracture 01/16/2017  . Sprain of ankle 03/23/2016  . Closed fracture of lateral malleolus 03/16/2016  . Health care maintenance 01/24/2016  . Blood pressure elevated without history of HTN 10/25/2015  . Essential hypertension 10/25/2015  . Multiple sclerosis ([place]) 10/02/2015  . Chronic left shoulder pain 07/19/2015  . Multiple sclerosis exacerbation ([place]) 07/14/2015  . MS (multiple sclerosis) ([place]) 11/26/2014  . Increased body mass index 11/26/2014  . HPV test positive 11/26/2014  . Status post laparoscopic supracervical hysterectomy 11/26/2014  . Galactorrhea 11/26/2014  . Back ache 05/21/2014  . Adiposity 05/21/2014  . Disordered sleep 05/21/2014  . Muscle spasticity 05/21/2014  . Spasticity 05/21/2014  . Calculus of kidney 12/09/2013  . Renal colic 72/82/0601  . Hypercholesteremia 08/19/2013  . Hereditary and idiopathic neuropathy 08/19/2013  . Hypercholesterolemia without hypertriglyceridemia  08/19/2013  . Bladder infection, chronic 07/25/2012  . Disorder of bladder function 07/25/2012  . Incomplete bladder emptying 07/25/2012  . Microscopic hematuria 07/25/2012  . Right upper quadrant pain 07/25/2012    Florinda Marker, SPT This entire session was performed under direct supervision and direction of a licensed therapist/therapist assistant . I have personally read, edited and approve of the note as written.  Janna Arch, PT, DPT   06/24/2019, 12:25 PM  [place] MAIN [facility] SERVICES [address] [place], [place], [address] Phone: [phone]   Fax:  [phone]  Name: Lisa Williams MRN: [id number] Date of Birth: [birth year]

## 2019-06-26 ENCOUNTER — Ambulatory Visit: Payer: PPO

## 2019-07-01 ENCOUNTER — Ambulatory Visit: Payer: PPO

## 2019-07-01 ENCOUNTER — Other Ambulatory Visit: Payer: Self-pay

## 2019-07-01 DIAGNOSIS — R2689 Other abnormalities of gait and mobility: Secondary | ICD-10-CM

## 2019-07-01 DIAGNOSIS — M6281 Muscle weakness (generalized): Secondary | ICD-10-CM

## 2019-07-01 DIAGNOSIS — R2681 Unsteadiness on feet: Secondary | ICD-10-CM

## 2019-07-01 NOTE — Therapy (Signed)
[place] MAIN [facility] SERVICES [address] [place], [place], [address] Phone: [phone]   Fax:  [phone]  Physical Therapy Treatment  Patient Details  Name: Lisa Williams MRN: [id number] Date of Birth: November 28, 1960 No data recorded  Encounter Date: 07/01/2019  PT End of Session - 07/01/19 1119    Visit Number  [id number]    Number of Visits  164    Date for PT Re-Evaluation  07/29/19    Authorization Type  4/10 PN 06/17/19    PT Start Time  1030    PT Stop Time  1113    PT Time Calculation (min)  43 min    Equipment Utilized During Treatment  Gait belt    Activity Tolerance  Patient tolerated treatment well;Other (comment)   heat intolerance   Behavior During Therapy  WFL for tasks assessed/performed       Past Medical History:  Diagnosis Date  . Abdominal pain, right upper quadrant   . Back pain   . Calculus of kidney 12/09/2013  . Chronic back pain    unspecified  . Chronic left shoulder pain 07/19/2015  . Functional disorder of bladder    other  . Galactorrhea 11/26/2014   Chronic   . Hereditary and idiopathic neuropathy 08/19/2013  . HPV test positive   . Hypercholesteremia 08/19/2013  . Incomplete bladder emptying   . Microscopic hematuria   . MS (multiple sclerosis) ([place])   . Muscle spasticity 05/21/2014  . Nonspecific findings on examination of urine    other  . Osteopenia   . Status post laparoscopic supracervical hysterectomy 11/26/2014  . Tobacco user 11/26/2014  . Wrist fracture     Past Surgical History:  Procedure Laterality Date  . bilateral tubal ligation  1996  . BREAST CYST EXCISION Left 2002  . KNEE SURGERY     right  . LAPAROSCOPIC SUPRACERVICAL HYSTERECTOMY  08/05/2013  . ORIF WRIST FRACTURE Left 01/17/2017   Procedure: OPEN REDUCTION INTERNAL FIXATION (ORIF) WRIST FRACTURE;  Surgeon: Lovell Sheehan, MD;  Location: [facility] ORS;  Service: Orthopedics;  Laterality: Left;  . TUBAL LIGATION Bilateral   .  VAGINAL HYSTERECTOMY  03/2006    There were no vitals filed for this visit.  Subjective Assessment - 07/01/19 1121    Subjective  Patient reports sharp spasm down RLE recently and during session. No falls/LOB since last session.    Patient is accompained by:  Family member    Pertinent History  Pt is a 59 y/o F who presents with BLE weakness and difficulty ambulating due to h/o MS that was diagnosed in 1995. Pt denies any pain associated. Pt was admitted to the hospital in November 2018 due to MS exacerbation. Nov-Dec to HHPT-->[facility] until end of Jan. After rehab the pt was able to ambulate up to 100 ft with the RW. Pt has Bil AFO, her therapist at [place] thinks she needs a new AFO as her feet still drag when she ambulates, pt reports HHPT mentioned a spring AFO. Pt has custom AFOs that were originally made 4 years ago. Goals: improve balance, to be able to get into the shower with her tub bench without assist, to be able to get up 2 steps at sister's home with railings on both sides but too far apart to reach both sides, to be able to drive adapted car. Pt is staying at other sister's house with one step to get inside and currently picking up L leg with  UEs and then stepping in. Pt steps down leading with LLE. Pt wants to work on walking side to side as this is challenging and she had not yet progressed to this at rehab. Pt able to get in/out the car with assist only to bring BLEs into the car. Hoping to drive by June or July with adapted car (already has one). Pt needs assist bringing LEs into and out of hospital bed. Pt able to perform sit>stand without assist from [facility] with a pillow on the seat for a boost. Pt denies any falls in the past 6 months. Pt has been increasing her time alone at home up to a few hours at a time to become more independent. Pt has both manual and power WC. Uses power WC at home, uses manual WC out in community. Pt able to self propel manual WC. Pt needs assist putting  pants around ankles but she can pull them up. Pt can put on her shoes but needs help getting her leg up to do so. Pt able to tie her shoelaces on her own. Pt has a tub/shower unit at home with a tub bench. No grab bars in the shower. Has hand held shower head. Pt currently taking a sponge bath. Pt has a regular height toilet with a BSC over top. Pt with h/o L wrist fx with no precautions, pt wore her soft brace to evaluation for comfort but reports her wrist has healed and is doing well following OT.     Limitations  Lifting;Standing;Walking;House hold activities    How long can you sit comfortably?  n/a    How long can you stand comfortably?  3 minutes without UE assist, limited by fatigue    How long can you walk comfortably?  100 ft, limited by fatigue    Patient Stated Goals  see above    Currently in Pain?  No/denies    Pain Score  0-No pain    Multiple Pain Sites  No      Seated rest breaks with ice pack between exercises/sets for exacerbation MS symptoms.  Therapeutic Exercise: // bars, CGA, BUE support: Standing weight shifts, lateral 20 sec, each LE forward 15 sec  Step through, 5x each LE, cueing for foot clearance  Ambulating, 2x 2 lengths of // bars, cueing for weight shift and LE lift for foot clearance  Seated with WC against wall for stabilization: Side ball tosses to PT with yellow weighted ball, 10x total alternating sides, with trunk rotation for abdominal strength  Single leg march in PF, 10x each LE, for muscle recruitment  Windmills with YTB crossbody resistance, PT holding at contralateral LE, 10x each UE, rest between sides  Abduction against PT resistance, 10x 5 sec holds, for abductor musculature recruitment  Rainbow ball adduction, 10x 5 sec holds  Forward weight shift/rock, to replicate initiation of STS, 10x, cueing for pressing through LE    Patient reported with excellent motivation and tolerated exercises well. She displayed increased motion with  muscle activation this session, as noted during PF and abdduction/adduction exercises. Patient performed well with weight shift onto each LE in standing and was able to clear foot during gait exercises. She continues to be limited by MS exacerbation symptoms, requiring seated rest breaks with ice pack when fatigued. Patient reported sharp spasm pain down RLE that relieved with forward trunk lean stretch. Patient will benefit from further skilled PT intervention to build upon improvements in strength and balance in order to improve QOL and  safety.                             PT Education - 07/01/19 1022    Education provided  Yes    Education Details  exercise technique, body mechanics    Person(s) Educated  Patient    Methods  Explanation;Demonstration;Tactile cues;Verbal cues    Comprehension  Verbalized understanding;Returned demonstration;Verbal cues required;Tactile cues required       PT Short Term Goals - 03/13/19 1733      PT SHORT TERM GOAL #1   Title  Patient will be compliant with HEP 4-5 days/week for improved carryover between sessions.     Baseline  HEP compliancy between sessions 6/17 compliant    Time  2    Period  Weeks    Status  Achieved    Target Date  10/15/18      PT SHORT TERM GOAL #2   Title  Patient will report no falls/LOB to indicate improved stability with mobility     Baseline  6/17: no falls, one episode of instability 7/20: no falls    Time  2    Period  Weeks    Status  Achieved    Target Date  11/06/18        PT Long Term Goals - 06/17/19 0001      PT LONG TERM GOAL #1   Title  Patient will perform TUG in 40 seconds or less with walker to improve safe negotiation of environment.     Baseline  2/9: 38 sec with RW 12/29: 41 seconds with RW 11/5: 45 seconds 10/9: deferred due to pain 5/26: 61 seconds 6/17: 60 seconds 7/20: 59 seconds 8/26:  55 seconds    Time  12    Period  Weeks    Status  Achieved      PT LONG TERM GOAL  #2   Title  Pt's 7mT will improve to at least 0.5 m/s for improved safety ambulating in home    Baseline  2/9: 0.28m 12/29: 0.31 m/s 03/13/19: 0.30 m/s 0.14 m/s; 5/15: .2667m 7/10: .36 m/s  8/20: .42 m/s  9/26: 0.47m60m0/14: .26 m/s improved hip and knee clearance 11/15: 0.8m/58mproved hip and knee clearance; 12/5: .29 m/s improved step length and forward moment: decreased foot drag. 05/09/2018: 0.21 m/s 2/6: .237 m/s 5/26: .23 m/s 6/17: .24 m/s  7/20: 0.26 m/s 8/26: 0.26 m/s 10/8: 0.28 m/s    Time  12    Period  Weeks    Status  Partially Met    Target Date  08/12/19      PT LONG TERM GOAL #3   Title  Pt's Bil hip F strength will improve to at least 2+/5 BLEs for improved gait mechanics and safety    Baseline  2/9: Hip abd/add 2+/5, hip flex 2/5, knee flex 3/5, knee ext 3+/5; 12/29:  Hips 2+/5 abduction/adduction, flexion 2/5  Knees 3+/5  11/5: Hips: 2/5 knees 3+/5 10/8: deferred due to pain L: 2/5, R: 1/5 7/10: 2/5 bilaterally  8/20: 2/5 bilat 9/26: 2/5 bilat 10/14: 2/5 bilaterally ,improved hip flexor muscle activation 2/5: improved hip flexor and quad activation: 12/5: 2/5 ; 05/09/2018: 2/5 2/6: 3/5 knee, 2 to 2+/5 hip 5/26: 2-/5 hips 3/5 knees 6/17: 2-/5 hips 3/5 knees    Time  12    Period  Weeks    Status  Partially Met    Target Date  08/12/19      PT LONG TERM GOAL #4   Title  Pt's ABC Scale will improve to at least 40% to demonstrate improved balance    Baseline  2/9: 30.6% 12/29: 34.7% 11/5: 32.2% 10/9: 32.1% 28.13% 5/15; 24.4% 7/10: 23.4% 8/20: 22. 2% 9/26: 25.3% 10/14: 26.5% 11/15: 24.38% 12/5: 26%; 05/09/2018: 29% 2/6: 23.75% 526: 30% 6/17: 26.8%  7/2: 28% 8/26: 30.3%    Time  12    Period  Weeks    Status  Partially Met    Target Date  09/09/19      PT LONG TERM GOAL #5   Title  Patient will negotiate one step ~4 inches to enter house independently to increase functional independence in natural environment    Baseline  12/29: 1.9 inch lift after multiple attempts11/5: able  to lift 1.75 inch 10/9: has lifted 1.5 inch in previous sessions /20: unable to do 9/26: patient too fatigued/not feeling well to perform 10/14: unable to perform at this time 11/15: unable to perform at this time; 12/5: able to lift 1 inch; 05/09/2018: unable to clear 4" step, able to lift foot ~ 1in 5/26: has been unable to perform 6/17: able to lift foot 1.25 inch 8/26: has been able to step up onto 1.5 inch step in previous sessions    Time  12    Period  Weeks    Status  Deferred      PT LONG TERM GOAL #8   Title  Patient will negotiate tub with mod I to be able to bath/shower in bathroom requiring stability and ability to negotiate small area.     Status  Unable to assess            Plan - 07/01/19 1210    Clinical Impression Statement  Patient reported with excellent motivation and tolerated exercises well. She displayed increased motion with muscle activation this session, as noted during PF and abdduction/adduction exercises. Patient performed well with weight shift onto each LE in standing and was able to clear foot during gait exercises. She continues to be limited by MS exacerbation symptoms, requiring seated rest breaks with ice pack when fatigued. Patient reported sharp spasm pain down RLE that relieved with forward trunk lean stretch. Patient will benefit from further skilled PT intervention to build upon improvements in strength and balance in order to improve QOL and safety.    Rehab Potential  Good    PT Frequency  2x / week    PT Duration  12 weeks    PT Treatment/Interventions  ADLs/Self Care Home Management;Aquatic Therapy;Biofeedback;Cryotherapy;Electrical Stimulation;Iontophoresis 7m/ml Dexamethasone;Traction;Moist Heat;Ultrasound;DME Instruction;Gait training;Stair training;Functional mobility training;Therapeutic activities;Therapeutic exercise;Balance training;Neuromuscular re-education;Patient/family education;Orthotic Fit/Training;Wheelchair mobility training;Manual  techniques;Compression bandaging;Passive range of motion;Dry needling;Energy conservation;Taping;Splinting    PT Next Visit Plan  progress note    PT Home Exercise Plan  to initiate next session (please incorporate hip flexor and adductor stretches in HEP if possible)    Consulted and Agree with Plan of Care  Patient       Patient will benefit from skilled therapeutic intervention in order to improve the following deficits and impairments:  Abnormal gait, Decreased activity tolerance, Decreased balance, Decreased coordination, Decreased endurance, Decreased knowledge of use of DME, Decreased mobility, Decreased range of motion, Decreased safety awareness, Decreased strength, Difficulty walking, Impaired perceived functional ability, Impaired sensation, Impaired tone, Impaired UE functional use, Improper body mechanics, Postural dysfunction  Visit Diagnosis: Muscle weakness (generalized)  Other abnormalities of gait and mobility  Unsteadiness  on feet     Problem List Patient Active Problem List   Diagnosis Date Noted  . Abscess of female pelvis   . SVT (supraventricular tachycardia) ([place])   . Radial styloid tenosynovitis 03/12/2018  . Wheelchair confinement 02/27/2018  . Localized osteoporosis with current pathological fracture with routine healing 01/19/2017  . Wrist fracture 01/16/2017  . Sprain of ankle 03/23/2016  . Closed fracture of lateral malleolus 03/16/2016  . Health care maintenance 01/24/2016  . Blood pressure elevated without history of HTN 10/25/2015  . Essential hypertension 10/25/2015  . Multiple sclerosis ([place]) 10/02/2015  . Chronic left shoulder pain 07/19/2015  . Multiple sclerosis exacerbation ([place]) 07/14/2015  . MS (multiple sclerosis) ([place]) 11/26/2014  . Increased body mass index 11/26/2014  . HPV test positive 11/26/2014  . Status post laparoscopic supracervical hysterectomy 11/26/2014  . Galactorrhea 11/26/2014  . Back ache 05/21/2014  . Adiposity  05/21/2014  . Disordered sleep 05/21/2014  . Muscle spasticity 05/21/2014  . Spasticity 05/21/2014  . Calculus of kidney 12/09/2013  . Renal colic 66/10/43  . Hypercholesteremia 08/19/2013  . Hereditary and idiopathic neuropathy 08/19/2013  . Hypercholesterolemia without hypertriglyceridemia 08/19/2013  . Bladder infection, chronic 07/25/2012  . Disorder of bladder function 07/25/2012  . Incomplete bladder emptying 07/25/2012  . Microscopic hematuria 07/25/2012  . Right upper quadrant pain 07/25/2012    Florinda Marker, SPT This entire session was performed under direct supervision and direction of a licensed therapist/therapist assistant . I have personally read, edited and approve of the note as written.  Janna Arch, PT, DPT   07/01/2019, 12:57 PM  [place] MAIN [facility] SERVICES [address] [place], [place], [address] Phone: [phone]   Fax:  [phone]  Name: Lisa Williams MRN: [id number] Date of Birth: [birth year]

## 2019-07-03 ENCOUNTER — Ambulatory Visit: Payer: PPO

## 2019-07-03 ENCOUNTER — Other Ambulatory Visit: Payer: Self-pay

## 2019-07-03 DIAGNOSIS — R2681 Unsteadiness on feet: Secondary | ICD-10-CM

## 2019-07-03 DIAGNOSIS — M6281 Muscle weakness (generalized): Secondary | ICD-10-CM

## 2019-07-03 DIAGNOSIS — R2689 Other abnormalities of gait and mobility: Secondary | ICD-10-CM

## 2019-07-03 NOTE — Therapy (Signed)
[place] MAIN [facility] SERVICES [address] [place], [place], [address] Phone: [phone]   Fax:  [phone]  Physical Therapy Treatment  Patient Details  Name: Lisa Williams MRN: [id number] Date of Birth: [birth year] No data recorded  Encounter Date: 07/03/2019  PT End of Session - 07/03/19 1023    Visit Number  [id number]    Number of Visits  164    Date for PT Re-Evaluation  07/29/19    Authorization Type  5/10 PN 06/17/19    PT Start Time  1029    PT Stop Time  1113    PT Time Calculation (min)  44 min    Equipment Utilized During Treatment  Gait belt    Activity Tolerance  Patient tolerated treatment well;Other (comment)   heat intolerance   Behavior During Therapy  WFL for tasks assessed/performed       Past Medical History:  Diagnosis Date  . Abdominal pain, right upper quadrant   . Back pain   . Calculus of kidney 12/09/2013  . Chronic back pain    unspecified  . Chronic left shoulder pain 07/19/2015  . Functional disorder of bladder    other  . Galactorrhea 11/26/2014   Chronic   . Hereditary and idiopathic neuropathy 08/19/2013  . HPV test positive   . Hypercholesteremia 08/19/2013  . Incomplete bladder emptying   . Microscopic hematuria   . MS (multiple sclerosis) ([place])   . Muscle spasticity 05/21/2014  . Nonspecific findings on examination of urine    other  . Osteopenia   . Status post laparoscopic supracervical hysterectomy 11/26/2014  . Tobacco user 11/26/2014  . Wrist fracture     Past Surgical History:  Procedure Laterality Date  . bilateral tubal ligation  1996  . BREAST CYST EXCISION Left 2002  . KNEE SURGERY     right  . LAPAROSCOPIC SUPRACERVICAL HYSTERECTOMY  08/05/2013  . ORIF WRIST FRACTURE Left 01/17/2017   Procedure: OPEN REDUCTION INTERNAL FIXATION (ORIF) WRIST FRACTURE;  Surgeon: Lovell Sheehan, MD;  Location: [facility] ORS;  Service: Orthopedics;  Laterality: Left;  . TUBAL LIGATION Bilateral   .  VAGINAL HYSTERECTOMY  03/2006    There were no vitals filed for this visit.  Subjective Assessment - 07/03/19 1138    Subjective  Patient reports feeling fatigued today and continues to have sharp spasms down RLE. No falls/LOB since last session.    Patient is accompained by:  Family member    Pertinent History  Pt is a 59 y/o F who presents with BLE weakness and difficulty ambulating due to h/o MS that was diagnosed in 1995. Pt denies any pain associated. Pt was admitted to the hospital in November 2018 due to MS exacerbation. Nov-Dec to HHPT-->[facility] until end of Jan. After rehab the pt was able to ambulate up to 100 ft with the RW. Pt has Bil AFO, her therapist at [facility] thinks she needs a new AFO as her feet still drag when she ambulates, pt reports HHPT mentioned a spring AFO. Pt has custom AFOs that were originally made 4 years ago. Goals: improve balance, to be able to get into the shower with her tub bench without assist, to be able to get up 2 steps at sister's home with railings on both sides but too far apart to reach both sides, to be able to drive adapted car. Pt is staying at other sister's house with one step to get inside and currently picking up  L leg with UEs and then stepping in. Pt steps down leading with LLE. Pt wants to work on walking side to side as this is challenging and she had not yet progressed to this at rehab. Pt able to get in/out the car with assist only to bring BLEs into the car. Hoping to drive by June or July with adapted car (already has one). Pt needs assist bringing LEs into and out of hospital bed. Pt able to perform sit>stand without assist from [facility] with a pillow on the seat for a boost. Pt denies any falls in the past 6 months. Pt has been increasing her time alone at home up to a few hours at a time to become more independent. Pt has both manual and power WC. Uses power WC at home, uses manual WC out in community. Pt able to self propel manual WC. Pt  needs assist putting pants around ankles but she can pull them up. Pt can put on her shoes but needs help getting her leg up to do so. Pt able to tie her shoelaces on her own. Pt has a tub/shower unit at home with a tub bench. No grab bars in the shower. Has hand held shower head. Pt currently taking a sponge bath. Pt has a regular height toilet with a BSC over top. Pt with h/o L wrist fx with no precautions, pt wore her soft brace to evaluation for comfort but reports her wrist has healed and is doing well following OT.     Limitations  Lifting;Standing;Walking;House hold activities    How long can you sit comfortably?  n/a    How long can you stand comfortably?  3 minutes without UE assist, limited by fatigue    How long can you walk comfortably?  100 ft, limited by fatigue    Patient Stated Goals  see above    Currently in Pain?  Yes    Pain Location  Leg    Pain Orientation  Right    Pain Descriptors / Indicators  Spasm    Pain Frequency  Intermittent    Multiple Pain Sites  No     Seated rest breaks with ice pack required between exercises with MS exacerbation of symptoms.   Therapeutic Exercise:  Walker with CGA: Standing lateral weight shifts in front of plinth, 30 sec, for LE warm up and stability prior to gait  Ambulating 50 ft, WC follow, cueing for foot clearance  Seated in WC: Dance challenge alternating dance moves with PT, 2x 1 minute, including UE movements and trunk rotation for strength/endurance/coordination challenge  Boxing to mitts (crossbody punches, uppercuts, elbows), 20 sec each with rests between, for power and endurance  Bosu leg press, 12x each LE, for LE strength  Tricep push ups from WC arm rests, 12x  Balloon pass with PT, 2x 15 sec   Patient reported with excellent motivation and tolerated PT well. She presented with RLE spasms, so exercises were modified to be seated in WC. Patient performed well with coordination and upper body strength, and was  able to recruit proper musculature for LE strength exercises. She continues to be challenged with LE strength and endurance due to onset of MS symptoms. She requires seated rest breaks with ice pack during exacerbation of these symptoms due to difficulty with thermoregulation. Patient will benefit from further skilled PT intervention to build upon strength, balance, and coordnation improvements in order to improve QOL and safety.  PT Education - 07/03/19 1022    Education provided  Yes    Education Details  exercise technique, body mechanics    Person(s) Educated  Patient    Methods  Explanation;Demonstration;Tactile cues;Verbal cues    Comprehension  Verbalized understanding;Returned demonstration;Verbal cues required;Tactile cues required       PT Short Term Goals - 03/13/19 1733      PT SHORT TERM GOAL #1   Title  Patient will be compliant with HEP 4-5 days/week for improved carryover between sessions.     Baseline  HEP compliancy between sessions 6/17 compliant    Time  2    Period  Weeks    Status  Achieved    Target Date  10/15/18      PT SHORT TERM GOAL #2   Title  Patient will report no falls/LOB to indicate improved stability with mobility     Baseline  6/17: no falls, one episode of instability 7/20: no falls    Time  2    Period  Weeks    Status  Achieved    Target Date  11/06/18        PT Long Term Goals - 06/17/19 0001      PT LONG TERM GOAL #1   Title  Patient will perform TUG in 40 seconds or less with walker to improve safe negotiation of environment.     Baseline  2/9: 38 sec with RW 12/29: 41 seconds with RW 11/5: 45 seconds 10/9: deferred due to pain 5/26: 61 seconds 6/17: 60 seconds 7/20: 59 seconds 8/26:  55 seconds    Time  12    Period  Weeks    Status  Achieved      PT LONG TERM GOAL #2   Title  Pt's 34mT will improve to at least 0.5 m/s for improved safety ambulating in home    Baseline  2/9: 0.263m  12/29: 0.31 m/s 03/13/19: 0.30 m/s 0.14 m/s; 5/15: .2676m 7/10: .36 m/s  8/20: .42 m/s  9/26: 0.23m69m0/14: .26 m/s improved hip and knee clearance 11/15: 0.36m/79mproved hip and knee clearance; 12/5: .29 m/s improved step length and forward moment: decreased foot drag. 05/09/2018: 0.21 m/s 2/6: .237 m/s 5/26: .23 m/s 6/17: .24 m/s  7/20: 0.26 m/s 8/26: 0.26 m/s 10/8: 0.28 m/s    Time  12    Period  Weeks    Status  Partially Met    Target Date  08/12/19      PT LONG TERM GOAL #3   Title  Pt's Bil hip F strength will improve to at least 2+/5 BLEs for improved gait mechanics and safety    Baseline  2/9: Hip abd/add 2+/5, hip flex 2/5, knee flex 3/5, knee ext 3+/5; 12/29:  Hips 2+/5 abduction/adduction, flexion 2/5  Knees 3+/5  11/5: Hips: 2/5 knees 3+/5 10/8: deferred due to pain L: 2/5, R: 1/5 7/10: 2/5 bilaterally  8/20: 2/5 bilat 9/26: 2/5 bilat 10/14: 2/5 bilaterally ,improved hip flexor muscle activation 2/5: improved hip flexor and quad activation: 12/5: 2/5 ; 05/09/2018: 2/5 2/6: 3/5 knee, 2 to 2+/5 hip 5/26: 2-/5 hips 3/5 knees 6/17: 2-/5 hips 3/5 knees    Time  12    Period  Weeks    Status  Partially Met    Target Date  08/12/19      PT LONG TERM GOAL #4   Title  Pt's ABC Scale will improve to at least 40% to demonstrate improved balance  Baseline  2/9: 30.6% 12/29: 34.7% 11/5: 32.2% 10/9: 32.1% 28.13% 5/15; 24.4% 7/10: 23.4% 8/20: 22. 2% 9/26: 25.3% 10/14: 26.5% 11/15: 24.38% 12/5: 26%; 05/09/2018: 29% 2/6: 23.75% 526: 30% 6/17: 26.8%  7/2: 28% 8/26: 30.3%    Time  12    Period  Weeks    Status  Partially Met    Target Date  09/09/19      PT LONG TERM GOAL #5   Title  Patient will negotiate one step ~4 inches to enter house independently to increase functional independence in natural environment    Baseline  12/29: 1.9 inch lift after multiple attempts11/5: able to lift 1.75 inch 10/9: has lifted 1.5 inch in previous sessions /20: unable to do 9/26: patient too fatigued/not feeling  well to perform 10/14: unable to perform at this time 11/15: unable to perform at this time; 12/5: able to lift 1 inch; 05/09/2018: unable to clear 4" step, able to lift foot ~ 1in 5/26: has been unable to perform 6/17: able to lift foot 1.25 inch 8/26: has been able to step up onto 1.5 inch step in previous sessions    Time  12    Period  Weeks    Status  Deferred      PT LONG TERM GOAL #8   Title  Patient will negotiate tub with mod I to be able to bath/shower in bathroom requiring stability and ability to negotiate small area.     Status  Unable to assess            Plan - 07/03/19 1150    Clinical Impression Statement  Patient reported with excellent motivation and tolerated PT well. She presented with RLE spasms, so exercises were modified to be seated in WC. Patient performed well with coordination and upper body strength, and was able to recruit proper musculature for LE strength exercises. She continues to be challenged with LE strength and endurance due to onset of MS symptoms. She requires seated rest breaks with ice pack during exacerbation of these symptoms due to difficulty with thermoregulation. Patient will benefit from further skilled PT intervention to build upon strength, balance, and coordnation improvements in order to improve QOL and safety.    Rehab Potential  Good    PT Frequency  2x / week    PT Duration  12 weeks    PT Treatment/Interventions  ADLs/Self Care Home Management;Aquatic Therapy;Biofeedback;Cryotherapy;Electrical Stimulation;Iontophoresis 35m/ml Dexamethasone;Traction;Moist Heat;Ultrasound;DME Instruction;Gait training;Stair training;Functional mobility training;Therapeutic activities;Therapeutic exercise;Balance training;Neuromuscular re-education;Patient/family education;Orthotic Fit/Training;Wheelchair mobility training;Manual techniques;Compression bandaging;Passive range of motion;Dry needling;Energy conservation;Taping;Splinting    PT Next Visit Plan   progress note    PT Home Exercise Plan  to initiate next session (please incorporate hip flexor and adductor stretches in HEP if possible)    Consulted and Agree with Plan of Care  Patient       Patient will benefit from skilled therapeutic intervention in order to improve the following deficits and impairments:  Abnormal gait, Decreased activity tolerance, Decreased balance, Decreased coordination, Decreased endurance, Decreased knowledge of use of DME, Decreased mobility, Decreased range of motion, Decreased safety awareness, Decreased strength, Difficulty walking, Impaired perceived functional ability, Impaired sensation, Impaired tone, Impaired UE functional use, Improper body mechanics, Postural dysfunction  Visit Diagnosis: Muscle weakness (generalized)  Other abnormalities of gait and mobility  Unsteadiness on feet     Problem List Patient Active Problem List   Diagnosis Date Noted  . Abscess of female pelvis   . SVT [place]ricular [place]cardia) ([place]   .  Radial styloid tenosynovitis 03/12/2018  . Wheelchair confinement 02/27/2018  . Localized osteoporosis with current pathological fracture with routine healing 01/19/2017  . Wrist fracture 01/16/2017  . Sprain of ankle 03/23/2016  . Closed fracture of lateral malleolus 03/16/2016  . Health care maintenance 01/24/2016  . Blood pressure elevated without history of HTN 10/25/2015  . Essential hypertension 10/25/2015  . Multiple sclerosis ([place]) 10/02/2015  . Chronic left shoulder pain 07/19/2015  . Multiple sclerosis exacerbation ([place]) 07/14/2015  . MS (multiple sclerosis) ([place]) 11/26/2014  . Increased body mass index 11/26/2014  . HPV test positive 11/26/2014  . Status post laparoscopic supracervical hysterectomy 11/26/2014  . Galactorrhea 11/26/2014  . Back ache 05/21/2014  . Adiposity 05/21/2014  . Disordered sleep 05/21/2014  . Muscle spasticity 05/21/2014  . Spasticity 05/21/2014  . Calculus of kidney 12/09/2013   . Renal colic 22/77/3750  . Hypercholesteremia 08/19/2013  . Hereditary and idiopathic neuropathy 08/19/2013  . Hypercholesterolemia without hypertriglyceridemia 08/19/2013  . Bladder infection, chronic 07/25/2012  . Disorder of bladder function 07/25/2012  . Incomplete bladder emptying 07/25/2012  . Microscopic hematuria 07/25/2012  . Right upper quadrant pain 07/25/2012    Florinda Marker, SPT This entire session was performed under direct supervision and direction of a licensed therapist/therapist assistant . I have personally read, edited and approve of the note as written.  Janna Arch, PT, DPT   07/03/2019, 1:25 PM  [place] MAIN [facility] SERVICES [address] [place], [place], [address] Phone: [phone]   Fax:  [phone]  Name: Lisa Williams MRN: [id number] Date of Birth: Mar 03, 1961

## 2019-07-08 ENCOUNTER — Other Ambulatory Visit: Payer: Self-pay

## 2019-07-08 ENCOUNTER — Ambulatory Visit: Payer: PPO | Attending: Neurology

## 2019-07-08 DIAGNOSIS — M6281 Muscle weakness (generalized): Secondary | ICD-10-CM | POA: Insufficient documentation

## 2019-07-08 DIAGNOSIS — M5412 Radiculopathy, cervical region: Secondary | ICD-10-CM | POA: Diagnosis not present

## 2019-07-08 DIAGNOSIS — R2681 Unsteadiness on feet: Secondary | ICD-10-CM | POA: Insufficient documentation

## 2019-07-08 DIAGNOSIS — R2689 Other abnormalities of gait and mobility: Secondary | ICD-10-CM | POA: Insufficient documentation

## 2019-07-08 NOTE — Therapy (Signed)
[place] MAIN [facility] SERVICES [address] [place], [place], [address] Phone: [phone]   Fax:  [phone]  Physical Therapy Treatment  Patient Details  Name: Lisa Williams MRN: [id number] Date of Birth: 13-May-1960 No data recorded  Encounter Date: 07/08/2019  PT End of Session - 07/08/19 1116    Visit Number  155    Number of Visits  164    Date for PT Re-Evaluation  07/29/19    Authorization Type  5/10 PN 06/17/19    PT Start Time  1017    PT Stop Time  1100    PT Time Calculation (min)  43 min    Equipment Utilized During Treatment  Gait belt    Activity Tolerance  Patient tolerated treatment well;Other (comment)   heat intolerance   Behavior During Therapy  WFL for tasks assessed/performed       Past Medical History:  Diagnosis Date  . Abdominal pain, right upper quadrant   . Back pain   . Calculus of kidney 12/09/2013  . Chronic back pain    unspecified  . Chronic left shoulder pain 07/19/2015  . Functional disorder of bladder    other  . Galactorrhea 11/26/2014   Chronic   . Hereditary and idiopathic neuropathy 08/19/2013  . HPV test positive   . Hypercholesteremia 08/19/2013  . Incomplete bladder emptying   . Microscopic hematuria   . MS (multiple sclerosis) ([place])   . Muscle spasticity 05/21/2014  . Nonspecific findings on examination of urine    other  . Osteopenia   . Status post laparoscopic supracervical hysterectomy 11/26/2014  . Tobacco user 11/26/2014  . Wrist fracture     Past Surgical History:  Procedure Laterality Date  . bilateral tubal ligation  1996  . BREAST CYST EXCISION Left 2002  . KNEE SURGERY     right  . LAPAROSCOPIC SUPRACERVICAL HYSTERECTOMY  08/05/2013  . ORIF WRIST FRACTURE Left 01/17/2017   Procedure: OPEN REDUCTION INTERNAL FIXATION (ORIF) WRIST FRACTURE;  Surgeon: Lovell Sheehan, MD;  Location: [facility] ORS;  Service: Orthopedics;  Laterality: Left;  . TUBAL LIGATION Bilateral   .  VAGINAL HYSTERECTOMY  03/2006    There were no vitals filed for this visit.  Subjective Assessment - 07/08/19 1114    Subjective  Patient reports she got her first COVID shot over the weekend, no negative affects. No falls or LOB since last session.    Patient is accompained by:  Family member    Pertinent History  Pt is a 59 y/o F who presents with BLE weakness and difficulty ambulating due to h/o MS that was diagnosed in 1995. Pt denies any pain associated. Pt was admitted to the hospital in November 2018 due to MS exacerbation. Nov-Dec to HHPT-->[facility] until end of Jan. After rehab the pt was able to ambulate up to 100 ft with the RW. Pt has Bil AFO, her therapist at [facility] thinks she needs a new AFO as her feet still drag when she ambulates, pt reports HHPT mentioned a spring AFO. Pt has custom AFOs that were originally made 4 years ago. Goals: improve balance, to be able to get into the shower with her tub bench without assist, to be able to get up 2 steps at sister's home with railings on both sides but too far apart to reach both sides, to be able to drive adapted car. Pt is staying at other sister's house with one step to get inside and  currently picking up L leg with UEs and then stepping in. Pt steps down leading with LLE. Pt wants to work on walking side to side as this is challenging and she had not yet progressed to this at rehab. Pt able to get in/out the car with assist only to bring BLEs into the car. Hoping to drive by June or July with adapted car (already has one). Pt needs assist bringing LEs into and out of hospital bed. Pt able to perform sit>stand without assist from [facility] with a pillow on the seat for a boost. Pt denies any falls in the past 6 months. Pt has been increasing her time alone at home up to a few hours at a time to become more independent. Pt has both manual and power WC. Uses power WC at home, uses manual WC out in community. Pt able to self propel manual WC. Pt  needs assist putting pants around ankles but she can pull them up. Pt can put on her shoes but needs help getting her leg up to do so. Pt able to tie her shoelaces on her own. Pt has a tub/shower unit at home with a tub bench. No grab bars in the shower. Has hand held shower head. Pt currently taking a sponge bath. Pt has a regular height toilet with a BSC over top. Pt with h/o L wrist fx with no precautions, pt wore her soft brace to evaluation for comfort but reports her wrist has healed and is doing well following OT.     Limitations  Lifting;Standing;Walking;House hold activities    How long can you sit comfortably?  n/a    How long can you stand comfortably?  3 minutes without UE assist, limited by fatigue    How long can you walk comfortably?  100 ft, limited by fatigue    Patient Stated Goals  see above    Currently in Pain?  No/denies       In // bars: CGA with cueing for body mechanics and sequencing for stabilization and mobility  weight shift in // bars 10x each side   Ambulation in // bars with focus on slow controlled weight shift with knee flexion/foot clearance  Bilateral knee flexion with UE support 5x each LE  Step forward and backwards 8x each LE with heavy BUE support ; seated rest break between LE's  alteranting Lateral step outs 5x    weighted ball (3000 gr) seated exercise: D1/2 pattern wood chops 10x each direction, rest break between Forward press with BUE's ; cueing for upright posture, core contraction, stabilization 15x Upward press, forward reach, row back arc of motion 10x   Isometric against PT resistance: 10x 3 second holds -hip flexion; single LE at a time, cueing for neutral alignment -hip abduction/ bilateral at same time, reduce UE support -hip adduction bilateral at same time, reduce UE support    Seated rest breaks and use of ice pack utilized to keep body temperature at functional range to reduce MS exacerbation.  Pt educated throughout  session about proper posture and technique with exercises. Improved exercise technique, movement at target joints, use of target muscles after min to mod verbal, visual, tactile cues.                  PT Education - 07/08/19 1115    Education provided  Yes    Education Details  exercise technique, body mechanics    Person(s) Educated  Patient    Methods  Explanation;Demonstration;Tactile  cues;Verbal cues    Comprehension  Verbalized understanding;Returned demonstration;Verbal cues required;Tactile cues required       PT Short Term Goals - 03/13/19 1733      PT SHORT TERM GOAL #1   Title  Patient will be compliant with HEP 4-5 days/week for improved carryover between sessions.     Baseline  HEP compliancy between sessions 6/17 compliant    Time  2    Period  Weeks    Status  Achieved    Target Date  10/15/18      PT SHORT TERM GOAL #2   Title  Patient will report no falls/LOB to indicate improved stability with mobility     Baseline  6/17: no falls, one episode of instability 7/20: no falls    Time  2    Period  Weeks    Status  Achieved    Target Date  11/06/18        PT Long Term Goals - 06/17/19 0001      PT LONG TERM GOAL #1   Title  Patient will perform TUG in 40 seconds or less with walker to improve safe negotiation of environment.     Baseline  2/9: 38 sec with RW 12/29: 41 seconds with RW 11/5: 45 seconds 10/9: deferred due to pain 5/26: 61 seconds 6/17: 60 seconds 7/20: 59 seconds 8/26:  55 seconds    Time  12    Period  Weeks    Status  Achieved      PT LONG TERM GOAL #2   Title  Pt's 63mT will improve to at least 0.5 m/s for improved safety ambulating in home    Baseline  2/9: 0.258m 12/29: 0.31 m/s 03/13/19: 0.30 m/s 0.14 m/s; 5/15: .262m 7/10: .36 m/s  8/20: .42 m/s  9/26: 0.53m110m0/14: .26 m/s improved hip and knee clearance 11/15: 0.93m/27mproved hip and knee clearance; 12/5: .29 m/s improved step length and forward moment: decreased foot  drag. 05/09/2018: 0.21 m/s 2/6: .237 m/s 5/26: .23 m/s 6/17: .24 m/s  7/20: 0.26 m/s 8/26: 0.26 m/s 10/8: 0.28 m/s    Time  12    Period  Weeks    Status  Partially Met    Target Date  08/12/19      PT LONG TERM GOAL #3   Title  Pt's Bil hip F strength will improve to at least 2+/5 BLEs for improved gait mechanics and safety    Baseline  2/9: Hip abd/add 2+/5, hip flex 2/5, knee flex 3/5, knee ext 3+/5; 12/29:  Hips 2+/5 abduction/adduction, flexion 2/5  Knees 3+/5  11/5: Hips: 2/5 knees 3+/5 10/8: deferred due to pain L: 2/5, R: 1/5 7/10: 2/5 bilaterally  8/20: 2/5 bilat 9/26: 2/5 bilat 10/14: 2/5 bilaterally ,improved hip flexor muscle activation 2/5: improved hip flexor and quad activation: 12/5: 2/5 ; 05/09/2018: 2/5 2/6: 3/5 knee, 2 to 2+/5 hip 5/26: 2-/5 hips 3/5 knees 6/17: 2-/5 hips 3/5 knees    Time  12    Period  Weeks    Status  Partially Met    Target Date  08/12/19      PT LONG TERM GOAL #4   Title  Pt's ABC Scale will improve to at least 40% to demonstrate improved balance    Baseline  2/9: 30.6% 12/29: 34.7% 11/5: 32.2% 10/9: 32.1% 28.13% 5/15; 24.4% 7/10: 23.4% 8/20: 22. 2% 9/26: 25.3% 10/14: 26.5% 11/15: 24.38% 12/5: 26%; 05/09/2018: 29% 2/6: 23.75% 526: 30%  6/17: 26.8%  7/2: 28% 8/26: 30.3%    Time  12    Period  Weeks    Status  Partially Met    Target Date  09/09/19      PT LONG TERM GOAL #5   Title  Patient will negotiate one step ~4 inches to enter house independently to increase functional independence in natural environment    Baseline  12/29: 1.9 inch lift after multiple attempts11/5: able to lift 1.75 inch 10/9: has lifted 1.5 inch in previous sessions /20: unable to do 9/26: patient too fatigued/not feeling well to perform 10/14: unable to perform at this time 11/15: unable to perform at this time; 12/5: able to lift 1 inch; 05/09/2018: unable to clear 4" step, able to lift foot ~ 1in 5/26: has been unable to perform 6/17: able to lift foot 1.25 inch 8/26: has been able to  step up onto 1.5 inch step in previous sessions    Time  12    Period  Weeks    Status  Deferred      PT LONG TERM GOAL #8   Title  Patient will negotiate tub with mod I to be able to bath/shower in bathroom requiring stability and ability to negotiate small area.     Status  Unable to assess            Plan - 07/08/19 1231    Clinical Impression Statement  Patient presents to physical therapy with excellent motivation. Continued focus on capacity for prolonged standing with decreasing rest breaks and increased stabilization of LE's. She is able to flex and extend lower extremities with heavy reliance upon UE's however is able to stabilize with decreased support when extended. Patient will benefit from further skilled PT intervention to build upon strength, balance, and coordnation improvements in order to improve QOL and safety.    Rehab Potential  Good    PT Frequency  2x / week    PT Duration  12 weeks    PT Treatment/Interventions  ADLs/Self Care Home Management;Aquatic Therapy;Biofeedback;Cryotherapy;Electrical Stimulation;Iontophoresis '4mg'$ /ml Dexamethasone;Traction;Moist Heat;Ultrasound;DME Instruction;Gait training;Stair training;Functional mobility training;Therapeutic activities;Therapeutic exercise;Balance training;Neuromuscular re-education;Patient/family education;Orthotic Fit/Training;Wheelchair mobility training;Manual techniques;Compression bandaging;Passive range of motion;Dry needling;Energy conservation;Taping;Splinting    PT Next Visit Plan  progress note    PT Home Exercise Plan  to initiate next session (please incorporate hip flexor and adductor stretches in HEP if possible)    Consulted and Agree with Plan of Care  Patient       Patient will benefit from skilled therapeutic intervention in order to improve the following deficits and impairments:  Abnormal gait, Decreased activity tolerance, Decreased balance, Decreased coordination, Decreased endurance, Decreased  knowledge of use of DME, Decreased mobility, Decreased range of motion, Decreased safety awareness, Decreased strength, Difficulty walking, Impaired perceived functional ability, Impaired sensation, Impaired tone, Impaired UE functional use, Improper body mechanics, Postural dysfunction  Visit Diagnosis: Muscle weakness (generalized)  Other abnormalities of gait and mobility  Unsteadiness on feet     Problem List Patient Active Problem List   Diagnosis Date Noted  . Abscess of female pelvis   . SVT (supraventricular tachycardia) ([place])   . Radial styloid tenosynovitis 03/12/2018  . Wheelchair confinement 02/27/2018  . Localized osteoporosis with current pathological fracture with routine healing 01/19/2017  . Wrist fracture 01/16/2017  . Sprain of ankle 03/23/2016  . Closed fracture of lateral malleolus 03/16/2016  . Health care maintenance 01/24/2016  . Blood pressure elevated without history of HTN 10/25/2015  . Essential  hypertension 10/25/2015  . Multiple sclerosis ([place]) 10/02/2015  . Chronic left shoulder pain 07/19/2015  . Multiple sclerosis exacerbation ([place]) 07/14/2015  . MS (multiple sclerosis) ([place]) 11/26/2014  . Increased body mass index 11/26/2014  . HPV test positive 11/26/2014  . Status post laparoscopic supracervical hysterectomy 11/26/2014  . Galactorrhea 11/26/2014  . Back ache 05/21/2014  . Adiposity 05/21/2014  . Disordered sleep 05/21/2014  . Muscle spasticity 05/21/2014  . Spasticity 05/21/2014  . Calculus of kidney 12/09/2013  . Renal colic 67/05/1001  . Hypercholesteremia 08/19/2013  . Hereditary and idiopathic neuropathy 08/19/2013  . Hypercholesterolemia without hypertriglyceridemia 08/19/2013  . Bladder infection, chronic 07/25/2012  . Disorder of bladder function 07/25/2012  . Incomplete bladder emptying 07/25/2012  . Microscopic hematuria 07/25/2012  . Right upper quadrant pain 07/25/2012   Janna Arch, PT, DPT   07/08/2019, 12:32  PM  [place] MAIN [facility] SERVICES [address] [place], [place], [address] Phone: [phone]   Fax:  [phone]  Name: Joanell Cressler Copes MRN: [id number] Date of Birth: May 06, 1961

## 2019-07-09 DIAGNOSIS — R252 Cramp and spasm: Secondary | ICD-10-CM | POA: Diagnosis not present

## 2019-07-09 DIAGNOSIS — G35 Multiple sclerosis: Secondary | ICD-10-CM | POA: Diagnosis not present

## 2019-07-09 DIAGNOSIS — G479 Sleep disorder, unspecified: Secondary | ICD-10-CM | POA: Diagnosis not present

## 2019-07-09 DIAGNOSIS — I10 Essential (primary) hypertension: Secondary | ICD-10-CM | POA: Diagnosis not present

## 2019-07-09 DIAGNOSIS — M25512 Pain in left shoulder: Secondary | ICD-10-CM | POA: Diagnosis not present

## 2019-07-09 DIAGNOSIS — G8929 Other chronic pain: Secondary | ICD-10-CM | POA: Diagnosis not present

## 2019-07-10 ENCOUNTER — Other Ambulatory Visit: Payer: Self-pay

## 2019-07-10 ENCOUNTER — Ambulatory Visit: Payer: PPO

## 2019-07-10 DIAGNOSIS — R2681 Unsteadiness on feet: Secondary | ICD-10-CM

## 2019-07-10 DIAGNOSIS — M6281 Muscle weakness (generalized): Secondary | ICD-10-CM | POA: Diagnosis not present

## 2019-07-10 DIAGNOSIS — R2689 Other abnormalities of gait and mobility: Secondary | ICD-10-CM

## 2019-07-10 NOTE — Therapy (Signed)
[place] MAIN [facility] SERVICES [address] [place], [place], [address] Phone: [phone]   Fax:  [phone]  Physical Therapy Treatment  Patient Details  Name: Lisa Williams MRN: [id number] Date of Birth: 1960-05-13 No data recorded  Encounter Date: 07/10/2019  PT End of Session - 07/10/19 1257    Visit Number  156    Number of Visits  164    Date for PT Re-Evaluation  07/29/19    Authorization Type  6/10 PN 06/17/19    PT Start Time  1020    PT Stop Time  1111    PT Time Calculation (min)  51 min    Equipment Utilized During Treatment  Gait belt    Activity Tolerance  Patient tolerated treatment well;Other (comment)   heat intolerance   Behavior During Therapy  WFL for tasks assessed/performed       Past Medical History:  Diagnosis Date  . Abdominal pain, right upper quadrant   . Back pain   . Calculus of kidney 12/09/2013  . Chronic back pain    unspecified  . Chronic left shoulder pain 07/19/2015  . Functional disorder of bladder    other  . Galactorrhea 11/26/2014   Chronic   . Hereditary and idiopathic neuropathy 08/19/2013  . HPV test positive   . Hypercholesteremia 08/19/2013  . Incomplete bladder emptying   . Microscopic hematuria   . MS (multiple sclerosis) ([place])   . Muscle spasticity 05/21/2014  . Nonspecific findings on examination of urine    other  . Osteopenia   . Status post laparoscopic supracervical hysterectomy 11/26/2014  . Tobacco user 11/26/2014  . Wrist fracture     Past Surgical History:  Procedure Laterality Date  . bilateral tubal ligation  1996  . BREAST CYST EXCISION Left 2002  . KNEE SURGERY     right  . LAPAROSCOPIC SUPRACERVICAL HYSTERECTOMY  08/05/2013  . ORIF WRIST FRACTURE Left 01/17/2017   Procedure: OPEN REDUCTION INTERNAL FIXATION (ORIF) WRIST FRACTURE;  Surgeon: Lovell Sheehan, MD;  Location: [facility] ORS;  Service: Orthopedics;  Laterality: Left;  . TUBAL LIGATION Bilateral   .  VAGINAL HYSTERECTOMY  03/2006    There were no vitals filed for this visit.  Subjective Assessment - 07/10/19 1256    Subjective  Patient reports she has a cyst on her leg but is going to the doctor next week to get it checked out. No falls or LOB since last session.    Patient is accompained by:  Family member    Pertinent History  Pt is a 59 y/o F who presents with BLE weakness and difficulty ambulating due to h/o MS that was diagnosed in 1995. Pt denies any pain associated. Pt was admitted to the hospital in November 2018 due to MS exacerbation. Nov-Dec to HHPT-->[facility] until end of Jan. After rehab the pt was able to ambulate up to 100 ft with the RW. Pt has Bil AFO, her therapist at [place] [facility] thinks she needs a new AFO as her feet still drag when she ambulates, pt reports HHPT mentioned a spring AFO. Pt has custom AFOs that were originally made 4 years ago. Goals: improve balance, to be able to get into the shower with her tub bench without assist, to be able to get up 2 steps at sister's home with railings on both sides but too far apart to reach both sides, to be able to drive adapted car. Pt is staying at other sister's  house with one step to get inside and currently picking up L leg with UEs and then stepping in. Pt steps down leading with LLE. Pt wants to work on walking side to side as this is challenging and she had not yet progressed to this at rehab. Pt able to get in/out the car with assist only to bring BLEs into the car. Hoping to drive by June or July with adapted car (already has one). Pt needs assist bringing LEs into and out of hospital bed. Pt able to perform sit>stand without assist from [facility] with a pillow on the seat for a boost. Pt denies any falls in the past 6 months. Pt has been increasing her time alone at home up to a few hours at a time to become more independent. Pt has both manual and power WC. Uses power WC at home, uses manual WC out in community. Pt able to self  propel manual WC. Pt needs assist putting pants around ankles but she can pull them up. Pt can put on her shoes but needs help getting her leg up to do so. Pt able to tie her shoelaces on her own. Pt has a tub/shower unit at home with a tub bench. No grab bars in the shower. Has hand held shower head. Pt currently taking a sponge bath. Pt has a regular height toilet with a BSC over top. Pt with h/o L wrist fx with no precautions, pt wore her soft brace to evaluation for comfort but reports her wrist has healed and is doing well following OT.     Limitations  Lifting;Standing;Walking;House hold activities    How long can you sit comfortably?  n/a    How long can you stand comfortably?  3 minutes without UE assist, limited by fatigue    How long can you walk comfortably?  100 ft, limited by fatigue    Patient Stated Goals  see above    Currently in Pain?  No/denies        Treatment: Standing with RW: weight shifts L, R with focus on hip shift rather than pulling with UE's 10x each side,    Ambulate in carpet hallway with RW , CGA, and w/c follow. Cueing for shifting of weight and hip/knee flexion for foot clearance. Occasional shuffle stepping of LLE due to friction of shoe on carpet. x49 ft.    Standing with RW: forward step and rock 5x each LE     Seated: GTB scaption pull stabilization 10x each LE GTB ER 10x cues for elbows in  Weighted bar 5lb  -overhead raise/press 10x; cueing for elbows in -chest press/row 10x with focus on scapular retraction  -arc from lateral knee to opposite knee 10x  -vertical hold with circumduction into clockwise circles 10x, counterclockwise 10x -tricep press 15x  Hamstring stretch 60 seconds each LE with PT overpressure   Bathroom mobility: stabilization provided to chair, assistance opening and closing door.       Seated rest breaks and use of ice pack utilized to keep body temperature at functional range to reduce MS exacerbation.      Pt educated  throughout session about proper posture and technique with exercises. Improved exercise technique, movement at target joints, use of target muscles after min to mod verbal, visual, tactile cues.                         PT Education - 07/10/19 1257    Education provided  Yes    Education Details  exercise technique, body mechanics    Person(s) Educated  Patient    Methods  Explanation;Demonstration;Tactile cues;Verbal cues    Comprehension  Verbalized understanding;Returned demonstration;Verbal cues required;Tactile cues required       PT Short Term Goals - 03/13/19 1733      PT SHORT TERM GOAL #1   Title  Patient will be compliant with HEP 4-5 days/week for improved carryover between sessions.     Baseline  HEP compliancy between sessions 6/17 compliant    Time  2    Period  Weeks    Status  Achieved    Target Date  10/15/18      PT SHORT TERM GOAL #2   Title  Patient will report no falls/LOB to indicate improved stability with mobility     Baseline  6/17: no falls, one episode of instability 7/20: no falls    Time  2    Period  Weeks    Status  Achieved    Target Date  11/06/18        PT Long Term Goals - 06/17/19 0001      PT LONG TERM GOAL #1   Title  Patient will perform TUG in 40 seconds or less with walker to improve safe negotiation of environment.     Baseline  2/9: 38 sec with RW 12/29: 41 seconds with RW 11/5: 45 seconds 10/9: deferred due to pain 5/26: 61 seconds 6/17: 60 seconds 7/20: 59 seconds 8/26:  55 seconds    Time  12    Period  Weeks    Status  Achieved      PT LONG TERM GOAL #2   Title  Pt's 63mT will improve to at least 0.5 m/s for improved safety ambulating in home    Baseline  2/9: 0.253m 12/29: 0.31 m/s 03/13/19: 0.30 m/s 0.14 m/s; 5/15: .2637m 7/10: .36 m/s  8/20: .42 m/s  9/26: 0.53m9m0/14: .26 m/s improved hip and knee clearance 11/15: 0.54m/4mproved hip and knee clearance; 12/5: .29 m/s improved step length and  forward moment: decreased foot drag. 05/09/2018: 0.21 m/s 2/6: .237 m/s 5/26: .23 m/s 6/17: .24 m/s  7/20: 0.26 m/s 8/26: 0.26 m/s 10/8: 0.28 m/s    Time  12    Period  Weeks    Status  Partially Met    Target Date  08/12/19      PT LONG TERM GOAL #3   Title  Pt's Bil hip F strength will improve to at least 2+/5 BLEs for improved gait mechanics and safety    Baseline  2/9: Hip abd/add 2+/5, hip flex 2/5, knee flex 3/5, knee ext 3+/5; 12/29:  Hips 2+/5 abduction/adduction, flexion 2/5  Knees 3+/5  11/5: Hips: 2/5 knees 3+/5 10/8: deferred due to pain L: 2/5, R: 1/5 7/10: 2/5 bilaterally  8/20: 2/5 bilat 9/26: 2/5 bilat 10/14: 2/5 bilaterally ,improved hip flexor muscle activation 2/5: improved hip flexor and quad activation: 12/5: 2/5 ; 05/09/2018: 2/5 2/6: 3/5 knee, 2 to 2+/5 hip 5/26: 2-/5 hips 3/5 knees 6/17: 2-/5 hips 3/5 knees    Time  12    Period  Weeks    Status  Partially Met    Target Date  08/12/19      PT LONG TERM GOAL #4   Title  Pt's ABC Scale will improve to at least 40% to demonstrate improved balance    Baseline  2/9: 30.6% 12/29: 34.7% 11/5: 32.2%  10/9: 32.1% 28.13% 5/15; 24.4% 7/10: 23.4% 8/20: 22. 2% 9/26: 25.3% 10/14: 26.5% 11/15: 24.38% 12/5: 26%; 05/09/2018: 29% 2/6: 23.75% 526: 30% 6/17: 26.8%  7/2: 28% 8/26: 30.3%    Time  12    Period  Weeks    Status  Partially Met    Target Date  09/09/19      PT LONG TERM GOAL #5   Title  Patient will negotiate one step ~4 inches to enter house independently to increase functional independence in natural environment    Baseline  12/29: 1.9 inch lift after multiple attempts11/5: able to lift 1.75 inch 10/9: has lifted 1.5 inch in previous sessions /20: unable to do 9/26: patient too fatigued/not feeling well to perform 10/14: unable to perform at this time 11/15: unable to perform at this time; 12/5: able to lift 1 inch; 05/09/2018: unable to clear 4" step, able to lift foot ~ 1in 5/26: has been unable to perform 6/17: able to lift foot  1.25 inch 8/26: has been able to step up onto 1.5 inch step in previous sessions    Time  12    Period  Weeks    Status  Deferred      PT LONG TERM GOAL #8   Title  Patient will negotiate tub with mod I to be able to bath/shower in bathroom requiring stability and ability to negotiate small area.     Status  Unable to assess            Plan - 07/10/19 1258    Clinical Impression Statement  Patient is more fatigued this session with increased tremors and fatigue with repeated motion. Frequent rest breaks required due to patient "overheating" with ice pack used to reduce temperature for continuation of therapy session. Patient will benefit from further skilled PT intervention to build upon strength, balance, and coordnation improvements in order to improve QOL and safety.    Rehab Potential  Good    PT Frequency  2x / week    PT Duration  12 weeks    PT Treatment/Interventions  ADLs/Self Care Home Management;Aquatic Therapy;Biofeedback;Cryotherapy;Electrical Stimulation;Iontophoresis 36m/ml Dexamethasone;Traction;Moist Heat;Ultrasound;DME Instruction;Gait training;Stair training;Functional mobility training;Therapeutic activities;Therapeutic exercise;Balance training;Neuromuscular re-education;Patient/family education;Orthotic Fit/Training;Wheelchair mobility training;Manual techniques;Compression bandaging;Passive range of motion;Dry needling;Energy conservation;Taping;Splinting    PT Next Visit Plan  progress note    PT Home Exercise Plan  to initiate next session (please incorporate hip flexor and adductor stretches in HEP if possible)    Consulted and Agree with Plan of Care  Patient       Patient will benefit from skilled therapeutic intervention in order to improve the following deficits and impairments:  Abnormal gait, Decreased activity tolerance, Decreased balance, Decreased coordination, Decreased endurance, Decreased knowledge of use of DME, Decreased mobility, Decreased range of  motion, Decreased safety awareness, Decreased strength, Difficulty walking, Impaired perceived functional ability, Impaired sensation, Impaired tone, Impaired UE functional use, Improper body mechanics, Postural dysfunction  Visit Diagnosis: Muscle weakness (generalized)  Other abnormalities of gait and mobility  Unsteadiness on feet     Problem List Patient Active Problem List   Diagnosis Date Noted  . Abscess of female pelvis   . SVT (supraventricular tachycardia) ([place]   . Radial styloid tenosynovitis 03/12/2018  . Wheelchair confinement 02/27/2018  . Localized osteoporosis with current pathological fracture with routine healing 01/19/2017  . Wrist fracture 01/16/2017  . Sprain of ankle 03/23/2016  . Closed fracture of lateral malleolus 03/16/2016  . Health care maintenance 01/24/2016  . Blood  pressure elevated without history of HTN 10/25/2015  . Essential hypertension 10/25/2015  . Multiple sclerosis ([place]) 10/02/2015  . Chronic left shoulder pain 07/19/2015  . Multiple sclerosis exacerbation ([place]) 07/14/2015  . MS (multiple sclerosis) ([place]) 11/26/2014  . Increased body mass index 11/26/2014  . HPV test positive 11/26/2014  . Status post laparoscopic supracervical hysterectomy 11/26/2014  . Galactorrhea 11/26/2014  . Back ache 05/21/2014  . Adiposity 05/21/2014  . Disordered sleep 05/21/2014  . Muscle spasticity 05/21/2014  . Spasticity 05/21/2014  . Calculus of kidney 12/09/2013  . Renal colic 92/49/3241  . Hypercholesteremia 08/19/2013  . Hereditary and idiopathic neuropathy 08/19/2013  . Hypercholesterolemia without hypertriglyceridemia 08/19/2013  . Bladder infection, chronic 07/25/2012  . Disorder of bladder function 07/25/2012  . Incomplete bladder emptying 07/25/2012  . Microscopic hematuria 07/25/2012  . Right upper quadrant pain 07/25/2012    Janna Arch, PT, DPT   07/10/2019, 12:59 PM  [place] MAIN [facility]  SERVICES [address] [place], [place], [address] Phone: [phone]   Fax:  [phone]  Name: Lisa Williams MRN: [id number] Date of Birth: January 29, 1961

## 2019-07-15 ENCOUNTER — Ambulatory Visit: Payer: PPO

## 2019-07-15 ENCOUNTER — Other Ambulatory Visit: Payer: Self-pay

## 2019-07-15 DIAGNOSIS — R2681 Unsteadiness on feet: Secondary | ICD-10-CM

## 2019-07-15 DIAGNOSIS — I10 Essential (primary) hypertension: Secondary | ICD-10-CM | POA: Diagnosis not present

## 2019-07-15 DIAGNOSIS — M6281 Muscle weakness (generalized): Secondary | ICD-10-CM | POA: Diagnosis not present

## 2019-07-15 DIAGNOSIS — E669 Obesity, unspecified: Secondary | ICD-10-CM | POA: Diagnosis not present

## 2019-07-15 DIAGNOSIS — Z993 Dependence on wheelchair: Secondary | ICD-10-CM | POA: Diagnosis not present

## 2019-07-15 DIAGNOSIS — R2689 Other abnormalities of gait and mobility: Secondary | ICD-10-CM

## 2019-07-15 DIAGNOSIS — G35 Multiple sclerosis: Secondary | ICD-10-CM | POA: Diagnosis not present

## 2019-07-15 NOTE — Therapy (Signed)
[place] MAIN [facility] SERVICES [address] [place], [place], [address] Phone: [phone]   Fax:  [phone]  Physical Therapy Treatment  Patient Details  Name: Lisa Williams MRN: [id number] Date of Birth: 26-Nov-1960 No data recorded  Encounter Date: 07/15/2019  PT End of Session - 07/15/19 1223    Visit Number  157    Number of Visits  164    Date for PT Re-Evaluation  07/29/19    Authorization Type  7/10 PN 06/17/19    PT Start Time  1029    PT Stop Time  1100    PT Time Calculation (min)  31 min    Equipment Utilized During Treatment  Gait belt    Activity Tolerance  Patient tolerated treatment well;Other (comment)   heat intolerance   Behavior During Therapy  WFL for tasks assessed/performed       Past Medical History:  Diagnosis Date  . Abdominal pain, right upper quadrant   . Back pain   . Calculus of kidney 12/09/2013  . Chronic back pain    unspecified  . Chronic left shoulder pain 07/19/2015  . Functional disorder of bladder    other  . Galactorrhea 11/26/2014   Chronic   . Hereditary and idiopathic neuropathy 08/19/2013  . HPV test positive   . Hypercholesteremia 08/19/2013  . Incomplete bladder emptying   . Microscopic hematuria   . MS (multiple sclerosis) ([place])   . Muscle spasticity 05/21/2014  . Nonspecific findings on examination of urine    other  . Osteopenia   . Status post laparoscopic supracervical hysterectomy 11/26/2014  . Tobacco user 11/26/2014  . Wrist fracture     Past Surgical History:  Procedure Laterality Date  . bilateral tubal ligation  1996  . BREAST CYST EXCISION Left 2002  . KNEE SURGERY     right  . LAPAROSCOPIC SUPRACERVICAL HYSTERECTOMY  08/05/2013  . ORIF WRIST FRACTURE Left 01/17/2017   Procedure: OPEN REDUCTION INTERNAL FIXATION (ORIF) WRIST FRACTURE;  Surgeon: Lovell Sheehan, MD;  Location: [facility] ORS;  Service: Orthopedics;  Laterality: Left;  . TUBAL LIGATION Bilateral   .  VAGINAL HYSTERECTOMY  03/2006    There were no vitals filed for this visit.  Subjective Assessment - 07/15/19 1222    Subjective  Patient has a doctor appointment today that will cut her PT session short. No falls or LOB since last session.    Patient is accompained by:  Family member    Pertinent History  Pt is a 59 y/o F who presents with BLE weakness and difficulty ambulating due to h/o MS that was diagnosed in 1995. Pt denies any pain associated. Pt was admitted to the hospital in November 2018 due to MS exacerbation. Nov-Dec to HHPT-->[facility] until end of Jan. After rehab the pt was able to ambulate up to 100 ft with the RW. Pt has Bil AFO, her therapist at [facility] thinks she needs a new AFO as her feet still drag when she ambulates, pt reports HHPT mentioned a spring AFO. Pt has custom AFOs that were originally made 4 years ago. Goals: improve balance, to be able to get into the shower with her tub bench without assist, to be able to get up 2 steps at sister's home with railings on both sides but too far apart to reach both sides, to be able to drive adapted car. Pt is staying at other sister's house with one step to get inside and currently  picking up L leg with UEs and then stepping in. Pt steps down leading with LLE. Pt wants to work on walking side to side as this is challenging and she had not yet progressed to this at rehab. Pt able to get in/out the car with assist only to bring BLEs into the car. Hoping to drive by June or July with adapted car (already has one). Pt needs assist bringing LEs into and out of hospital bed. Pt able to perform sit>stand without assist from [facility] with a pillow on the seat for a boost. Pt denies any falls in the past 6 months. Pt has been increasing her time alone at home up to a few hours at a time to become more independent. Pt has both manual and power WC. Uses power WC at home, uses manual WC out in community. Pt able to self propel manual WC. Pt needs  assist putting pants around ankles but she can pull them up. Pt can put on her shoes but needs help getting her leg up to do so. Pt able to tie her shoelaces on her own. Pt has a tub/shower unit at home with a tub bench. No grab bars in the shower. Has hand held shower head. Pt currently taking a sponge bath. Pt has a regular height toilet with a BSC over top. Pt with h/o L wrist fx with no precautions, pt wore her soft brace to evaluation for comfort but reports her wrist has healed and is doing well following OT.     Limitations  Lifting;Standing;Walking;House hold activities    How long can you sit comfortably?  n/a    How long can you stand comfortably?  3 minutes without UE assist, limited by fatigue    How long can you walk comfortably?  100 ft, limited by fatigue    Patient Stated Goals  see above    Currently in Pain?  No/denies          In // bars: CGA with cueing for body mechanics and sequencing for stabilization and mobility  weight shift in // bars 10x each side   Bilateral knee flexion into theraband with UE support 5x each LE  Step forward and backwards 8x each LE with heavy BUE support ; seated rest break between LE's  Lateral step outs/abduction 5x each LE; seated rest break between LE's Mini squats with excessive UE support 5x with chair behind.   Swiss ball seated:  Press down between UE's LE's for TrA activation 10x  Straight arm raises 10x  Cross body D2 pattern 10x each direction         Seated rest breaks and use of ice pack utilized to keep body temperature at functional range to reduce MS exacerbation.      Pt educated throughout session about proper posture and technique with exercises. Improved exercise technique, movement at target joints, use of target muscles after min to mod verbal, visual, tactile cues.                 PT Education - 07/15/19 1223    Education provided  Yes    Education Details  exercise technique, body mechanics     Person(s) Educated  Patient    Methods  Explanation;Demonstration;Tactile cues;Verbal cues    Comprehension  Verbalized understanding;Returned demonstration;Verbal cues required;Tactile cues required       PT Short Term Goals - 03/13/19 1733      PT SHORT TERM GOAL #1   Title  Patient will be compliant with HEP 4-5 days/week for improved carryover between sessions.     Baseline  HEP compliancy between sessions 6/17 compliant    Time  2    Period  Weeks    Status  Achieved    Target Date  10/15/18      PT SHORT TERM GOAL #2   Title  Patient will report no falls/LOB to indicate improved stability with mobility     Baseline  6/17: no falls, one episode of instability 7/20: no falls    Time  2    Period  Weeks    Status  Achieved    Target Date  11/06/18        PT Long Term Goals - 06/17/19 0001      PT LONG TERM GOAL #1   Title  Patient will perform TUG in 40 seconds or less with walker to improve safe negotiation of environment.     Baseline  2/9: 38 sec with RW 12/29: 41 seconds with RW 11/5: 45 seconds 10/9: deferred due to pain 5/26: 61 seconds 6/17: 60 seconds 7/20: 59 seconds 8/26:  55 seconds    Time  12    Period  Weeks    Status  Achieved      PT LONG TERM GOAL #2   Title  Pt's 39mT will improve to at least 0.5 m/s for improved safety ambulating in home    Baseline  2/9: 0.236m 12/29: 0.31 m/s 03/13/19: 0.30 m/s 0.14 m/s; 5/15: .2651m 7/10: .36 m/s  8/20: .42 m/s  9/26: 0.29m62m0/14: .26 m/s improved hip and knee clearance 11/15: 0.103m/75mproved hip and knee clearance; 12/5: .29 m/s improved step length and forward moment: decreased foot drag. 05/09/2018: 0.21 m/s 2/6: .237 m/s 5/26: .23 m/s 6/17: .24 m/s  7/20: 0.26 m/s 8/26: 0.26 m/s 10/8: 0.28 m/s    Time  12    Period  Weeks    Status  Partially Met    Target Date  08/12/19      PT LONG TERM GOAL #3   Title  Pt's Bil hip F strength will improve to at least 2+/5 BLEs for improved gait mechanics and safety     Baseline  2/9: Hip abd/add 2+/5, hip flex 2/5, knee flex 3/5, knee ext 3+/5; 12/29:  Hips 2+/5 abduction/adduction, flexion 2/5  Knees 3+/5  11/5: Hips: 2/5 knees 3+/5 10/8: deferred due to pain L: 2/5, R: 1/5 7/10: 2/5 bilaterally  8/20: 2/5 bilat 9/26: 2/5 bilat 10/14: 2/5 bilaterally ,improved hip flexor muscle activation 2/5: improved hip flexor and quad activation: 12/5: 2/5 ; 05/09/2018: 2/5 2/6: 3/5 knee, 2 to 2+/5 hip 5/26: 2-/5 hips 3/5 knees 6/17: 2-/5 hips 3/5 knees    Time  12    Period  Weeks    Status  Partially Met    Target Date  08/12/19      PT LONG TERM GOAL #4   Title  Pt's ABC Scale will improve to at least 40% to demonstrate improved balance    Baseline  2/9: 30.6% 12/29: 34.7% 11/5: 32.2% 10/9: 32.1% 28.13% 5/15; 24.4% 7/10: 23.4% 8/20: 22. 2% 9/26: 25.3% 10/14: 26.5% 11/15: 24.38% 12/5: 26%; 05/09/2018: 29% 2/6: 23.75% 526: 30% 6/17: 26.8%  7/2: 28% 8/26: 30.3%    Time  12    Period  Weeks    Status  Partially Met    Target Date  09/09/19      PT LONG TERM  GOAL #5   Title  Patient will negotiate one step ~4 inches to enter house independently to increase functional independence in natural environment    Baseline  12/29: 1.9 inch lift after multiple attempts11/5: able to lift 1.75 inch 10/9: has lifted 1.5 inch in previous sessions /20: unable to do 9/26: patient too fatigued/not feeling well to perform 10/14: unable to perform at this time 11/15: unable to perform at this time; 12/5: able to lift 1 inch; 05/09/2018: unable to clear 4" step, able to lift foot ~ 1in 5/26: has been unable to perform 6/17: able to lift foot 1.25 inch 8/26: has been able to step up onto 1.5 inch step in previous sessions    Time  12    Period  Weeks    Status  Deferred      PT LONG TERM GOAL #8   Title  Patient will negotiate tub with mod I to be able to bath/shower in bathroom requiring stability and ability to negotiate small area.     Status  Unable to assess            Plan - 07/15/19  1228    Clinical Impression Statement  Patient session limited due to having to leave early for a doctors appointment. Standing tolerance improving with decreased need for rest breaks and increased use of LE musculature. Patient will benefit from further skilled PT intervention to build upon strength, balance, and coordnation improvements in order to improve QOL and safety.    Rehab Potential  Good    PT Frequency  2x / week    PT Duration  12 weeks    PT Treatment/Interventions  ADLs/Self Care Home Management;Aquatic Therapy;Biofeedback;Cryotherapy;Electrical Stimulation;Iontophoresis 58m/ml Dexamethasone;Traction;Moist Heat;Ultrasound;DME Instruction;Gait training;Stair training;Functional mobility training;Therapeutic activities;Therapeutic exercise;Balance training;Neuromuscular re-education;Patient/family education;Orthotic Fit/Training;Wheelchair mobility training;Manual techniques;Compression bandaging;Passive range of motion;Dry needling;Energy conservation;Taping;Splinting    PT Next Visit Plan  progress note    PT Home Exercise Plan  to initiate next session (please incorporate hip flexor and adductor stretches in HEP if possible)    Consulted and Agree with Plan of Care  Patient       Patient will benefit from skilled therapeutic intervention in order to improve the following deficits and impairments:  Abnormal gait, Decreased activity tolerance, Decreased balance, Decreased coordination, Decreased endurance, Decreased knowledge of use of DME, Decreased mobility, Decreased range of motion, Decreased safety awareness, Decreased strength, Difficulty walking, Impaired perceived functional ability, Impaired sensation, Impaired tone, Impaired UE functional use, Improper body mechanics, Postural dysfunction  Visit Diagnosis: Muscle weakness (generalized)  Other abnormalities of gait and mobility  Unsteadiness on feet     Problem List Patient Active Problem List   Diagnosis Date Noted   . Abscess of female pelvis   . SVT (supraventricular tachycardia) ([place]   . Radial styloid tenosynovitis 03/12/2018  . Wheelchair confinement 02/27/2018  . Localized osteoporosis with current pathological fracture with routine healing 01/19/2017  . Wrist fracture 01/16/2017  . Sprain of ankle 03/23/2016  . Closed fracture of lateral malleolus 03/16/2016  . Health care maintenance 01/24/2016  . Blood pressure elevated without history of HTN 10/25/2015  . Essential hypertension 10/25/2015  . Multiple sclerosis ([place] 10/02/2015  . Chronic left shoulder pain 07/19/2015  . Multiple sclerosis exacerbation ([place] 07/14/2015  . MS (multiple sclerosis) ([place] 11/26/2014  . Increased body mass index 11/26/2014  . HPV test positive 11/26/2014  . Status post laparoscopic supracervical hysterectomy 11/26/2014  . Galactorrhea 11/26/2014  . Back ache 05/21/2014  .  Adiposity 05/21/2014  . Disordered sleep 05/21/2014  . Muscle spasticity 05/21/2014  . Spasticity 05/21/2014  . Calculus of kidney 12/09/2013  . Renal colic 61/54/8845  . Hypercholesteremia 08/19/2013  . Hereditary and idiopathic neuropathy 08/19/2013  . Hypercholesterolemia without hypertriglyceridemia 08/19/2013  . Bladder infection, chronic 07/25/2012  . Disorder of bladder function 07/25/2012  . Incomplete bladder emptying 07/25/2012  . Microscopic hematuria 07/25/2012  . Right upper quadrant pain 07/25/2012   Janna Arch, PT, DPT   07/15/2019, 12:29 PM  [place] MAIN [facility] SERVICES [address] [place], [place], [address] Phone: [phone]   Fax:  [phone]  Name: Lisa Williams MRN: [id number] Date of Birth: 1961-03-26

## 2019-07-17 ENCOUNTER — Other Ambulatory Visit: Payer: Self-pay

## 2019-07-17 ENCOUNTER — Ambulatory Visit: Payer: PPO

## 2019-07-17 DIAGNOSIS — M6281 Muscle weakness (generalized): Secondary | ICD-10-CM

## 2019-07-17 DIAGNOSIS — R2681 Unsteadiness on feet: Secondary | ICD-10-CM

## 2019-07-17 DIAGNOSIS — R2689 Other abnormalities of gait and mobility: Secondary | ICD-10-CM

## 2019-07-17 DIAGNOSIS — M5412 Radiculopathy, cervical region: Secondary | ICD-10-CM

## 2019-07-17 NOTE — Therapy (Signed)
[place] MAIN [facility] SERVICES [address] [place], [place], [address] Phone: [phone]   Fax:  [phone]  Physical Therapy Treatment  Patient Details  Name: Lisa Williams MRN: [id number] Date of Birth: March 02, 1961 No data recorded  Encounter Date: 07/17/2019  PT End of Session - 07/17/19 1028    Visit Number  [id number]    Number of Visits  164    Date for PT Re-Evaluation  07/29/19    Authorization Type  8/10 PN 06/17/19    PT Start Time  1030    PT Stop Time  1115    PT Time Calculation (min)  45 min    Equipment Utilized During Treatment  Gait belt    Activity Tolerance  Patient tolerated treatment well;Other (comment)    Behavior During Therapy  WFL for tasks assessed/performed       Past Medical History:  Diagnosis Date  . Abdominal pain, right upper quadrant   . Back pain   . Calculus of kidney 12/09/2013  . Chronic back pain    unspecified  . Chronic left shoulder pain 07/19/2015  . Functional disorder of bladder    other  . Galactorrhea 11/26/2014   Chronic   . Hereditary and idiopathic neuropathy 08/19/2013  . HPV test positive   . Hypercholesteremia 08/19/2013  . Incomplete bladder emptying   . Microscopic hematuria   . MS (multiple sclerosis) ([place])   . Muscle spasticity 05/21/2014  . Nonspecific findings on examination of urine    other  . Osteopenia   . Status post laparoscopic supracervical hysterectomy 11/26/2014  . Tobacco user 11/26/2014  . Wrist fracture     Past Surgical History:  Procedure Laterality Date  . bilateral tubal ligation  1996  . BREAST CYST EXCISION Left 2002  . KNEE SURGERY     right  . LAPAROSCOPIC SUPRACERVICAL HYSTERECTOMY  08/05/2013  . ORIF WRIST FRACTURE Left 01/17/2017   Procedure: OPEN REDUCTION INTERNAL FIXATION (ORIF) WRIST FRACTURE;  Surgeon: Lovell Sheehan, MD;  Location: [facility] ORS;  Service: Orthopedics;  Laterality: Left;  . TUBAL LIGATION Bilateral   . VAGINAL HYSTERECTOMY   03/2006    There were no vitals filed for this visit.  Subjective Assessment - 07/17/19 1025    Subjective  Patient reported that she is doing well today, does have some muscle spasms in her back.    Patient is accompained by:  Family member    Pertinent History  Pt is a 59 y/o F who presents with BLE weakness and difficulty ambulating due to h/o MS that was diagnosed in 1995. Pt denies any pain associated. Pt was admitted to the hospital in November 2018 due to MS exacerbation. Nov-Dec to HHPT-->[facility] until end of Jan. After rehab the pt was able to ambulate up to 100 ft with the RW. Pt has Bil AFO, her therapist at [facility] thinks she needs a new AFO as her feet still drag when she ambulates, pt reports HHPT mentioned a spring AFO. Pt has custom AFOs that were originally made 4 years ago. Goals: improve balance, to be able to get into the shower with her tub bench without assist, to be able to get up 2 steps at sister's home with railings on both sides but too far apart to reach both sides, to be able to drive adapted car. Pt is staying at other sister's house with one step to get inside and currently picking up L leg with UEs and  then stepping in. Pt steps down leading with LLE. Pt wants to work on walking side to side as this is challenging and she had not yet progressed to this at rehab. Pt able to get in/out the car with assist only to bring BLEs into the car. Hoping to drive by June or July with adapted car (already has one). Pt needs assist bringing LEs into and out of hospital bed. Pt able to perform sit>stand without assist from [facility] with a pillow on the seat for a boost. Pt denies any falls in the past 6 months. Pt has been increasing her time alone at home up to a few hours at a time to become more independent. Pt has both manual and power WC. Uses power WC at home, uses manual WC out in community. Pt able to self propel manual WC. Pt needs assist putting pants around ankles but she can  pull them up. Pt can put on her shoes but needs help getting her leg up to do so. Pt able to tie her shoelaces on her own. Pt has a tub/shower unit at home with a tub bench. No grab bars in the shower. Has hand held shower head. Pt currently taking a sponge bath. Pt has a regular height toilet with a BSC over top. Pt with h/o L wrist fx with no precautions, pt wore her soft brace to evaluation for comfort but reports her wrist has healed and is doing well following OT.     Limitations  Lifting;Standing;Walking;House hold activities    How long can you sit comfortably?  n/a    How long can you stand comfortably?  3 minutes without UE assist, limited by fatigue    How long can you walk comfortably?  100 ft, limited by fatigue    Patient Stated Goals  see above    Currently in Pain?  No/denies    Pain Score  2     Pain Location  Flank    Pain Orientation  Right    Pain Descriptors / Indicators  Spasm    Pain Type  Acute pain    Pain Onset  More than a month ago        Treatment:   Therapeutic exercises: Standing with RW: weight shifts L, R with focus on hip shift rather than pulling with UE's 10x each side 2 sets with seated rest break in between sets  Ambulate in carpet hallway with RW , supervision per pt request, and w/c follow. Cueing for shifting of weight and hip/knee flexion for foot clearance. Occasional shuffle stepping of LLE due to friction of shoe on carpet. x51 ft.    Seated:  Weighted bar 5lb   -overhead raise/press 10x; cueing for elbows in  -chest press/row 10x with focus on scapular retraction   -arc from lateral knee to opposite knee 10x  Hamstring stretch 60 seconds each LE with PT overpressure   x2 Lumbar flexion with therapy ball to L to stretch R flank/low back     Seated rest breaks and use of ice pack utilized to keep body temperature at functional range to reduce MS exacerbation.      Pt educated throughout session about proper posture and technique with  exercises. Improved exercise technique, movement at target joints, use of target muscles after min to mod verbal, visual, tactile cues.     Pt response/clinical impression: Pt has reported muscle spasms of R flank/side. Stated they are intermittent, comes and goes, reproducible with palpation (  potentially around superior R glute, and QL pain). Pt may benefit from assessment/trial of TDN. The patient demonstrated excellent motivation during session. The patient exhibited decreased bilateral foot clearance with fatigue while ambulating this session. The patient would benefit from further skilled PT intervention to maximize QOL, safety, and functional mobility.     PT Education - 07/17/19 1025    Education provided  Yes    Education Details  exercise technique, body mechanics    Person(s) Educated  Patient    Methods  Explanation;Demonstration;Tactile cues;Verbal cues    Comprehension  Verbalized understanding;Returned demonstration;Verbal cues required;Tactile cues required       PT Short Term Goals - 03/13/19 1733      PT SHORT TERM GOAL #1   Title  Patient will be compliant with HEP 4-5 days/week for improved carryover between sessions.     Baseline  HEP compliancy between sessions 6/17 compliant    Time  2    Period  Weeks    Status  Achieved    Target Date  10/15/18      PT SHORT TERM GOAL #2   Title  Patient will report no falls/LOB to indicate improved stability with mobility     Baseline  6/17: no falls, one episode of instability 7/20: no falls    Time  2    Period  Weeks    Status  Achieved    Target Date  11/06/18        PT Long Term Goals - 06/17/19 0001      PT LONG TERM GOAL #1   Title  Patient will perform TUG in 40 seconds or less with walker to improve safe negotiation of environment.     Baseline  2/9: 38 sec with RW 12/29: 41 seconds with RW 11/5: 45 seconds 10/9: deferred due to pain 5/26: 61 seconds 6/17: 60 seconds 7/20: 59 seconds 8/26:  55 seconds     Time  12    Period  Weeks    Status  Achieved      PT LONG TERM GOAL #2   Title  Pt's 32mT will improve to at least 0.5 m/s for improved safety ambulating in home    Baseline  2/9: 0.243m 12/29: 0.31 m/s 03/13/19: 0.30 m/s 0.14 m/s; 5/15: .2622m 7/10: .36 m/s  8/20: .42 m/s  9/26: 0.62m15m0/14: .26 m/s improved hip and knee clearance 11/15: 0.58m/55mproved hip and knee clearance; 12/5: .29 m/s improved step length and forward moment: decreased foot drag. 05/09/2018: 0.21 m/s 2/6: .237 m/s 5/26: .23 m/s 6/17: .24 m/s  7/20: 0.26 m/s 8/26: 0.26 m/s 10/8: 0.28 m/s    Time  12    Period  Weeks    Status  Partially Met    Target Date  08/12/19      PT LONG TERM GOAL #3   Title  Pt's Bil hip F strength will improve to at least 2+/5 BLEs for improved gait mechanics and safety    Baseline  2/9: Hip abd/add 2+/5, hip flex 2/5, knee flex 3/5, knee ext 3+/5; 12/29:  Hips 2+/5 abduction/adduction, flexion 2/5  Knees 3+/5  11/5: Hips: 2/5 knees 3+/5 10/8: deferred due to pain L: 2/5, R: 1/5 7/10: 2/5 bilaterally  8/20: 2/5 bilat 9/26: 2/5 bilat 10/14: 2/5 bilaterally ,improved hip flexor muscle activation 2/5: improved hip flexor and quad activation: 12/5: 2/5 ; 05/09/2018: 2/5 2/6: 3/5 knee, 2 to 2+/5 hip 5/26: 2-/5 hips 3/5 knees 6/17: 2-/5 hips  3/5 knees    Time  12    Period  Weeks    Status  Partially Met    Target Date  08/12/19      PT LONG TERM GOAL #4   Title  Pt's ABC Scale will improve to at least 40% to demonstrate improved balance    Baseline  2/9: 30.6% 12/29: 34.7% 11/5: 32.2% 10/9: 32.1% 28.13% 5/15; 24.4% 7/10: 23.4% 8/20: 22. 2% 9/26: 25.3% 10/14: 26.5% 11/15: 24.38% 12/5: 26%; 05/09/2018: 29% 2/6: 23.75% 526: 30% 6/17: 26.8%  7/2: 28% 8/26: 30.3%    Time  12    Period  Weeks    Status  Partially Met    Target Date  09/09/19      PT LONG TERM GOAL #5   Title  Patient will negotiate one step ~4 inches to enter house independently to increase functional independence in natural  environment    Baseline  12/29: 1.9 inch lift after multiple attempts11/5: able to lift 1.75 inch 10/9: has lifted 1.5 inch in previous sessions /20: unable to do 9/26: patient too fatigued/not feeling well to perform 10/14: unable to perform at this time 11/15: unable to perform at this time; 12/5: able to lift 1 inch; 05/09/2018: unable to clear 4" step, able to lift foot ~ 1in 5/26: has been unable to perform 6/17: able to lift foot 1.25 inch 8/26: has been able to step up onto 1.5 inch step in previous sessions    Time  12    Period  Weeks    Status  Deferred      PT LONG TERM GOAL #8   Title  Patient will negotiate tub with mod I to be able to bath/shower in bathroom requiring stability and ability to negotiate small area.     Status  Unable to assess            Plan - 07/17/19 1026    Clinical Impression Statement  Pt has reported muscle spasms of R flank/side. Stated they are intermittent, comes and goes, reproducible with palpation (potentially around superior R glute, and QL pain). Pt may benefit from assessment/trial of TDN. The patient demonstrated excellent motivation during session. The patient exhibited decreased bilateral foot clearance with fatigue while ambulating this session. The patient would benefit from further skilled PT intervention to maximize QOL, safety, and functional mobility.    Rehab Potential  Good    PT Frequency  2x / week    PT Duration  12 weeks    PT Treatment/Interventions  ADLs/Self Care Home Management;Aquatic Therapy;Biofeedback;Cryotherapy;Electrical Stimulation;Iontophoresis 67m/ml Dexamethasone;Traction;Moist Heat;Ultrasound;DME Instruction;Gait training;Stair training;Functional mobility training;Therapeutic activities;Therapeutic exercise;Balance training;Neuromuscular re-education;Patient/family education;Orthotic Fit/Training;Wheelchair mobility training;Manual techniques;Compression bandaging;Passive range of motion;Dry needling;Energy  conservation;Taping;Splinting    PT Next Visit Plan  continue weight bearing, functional mobility/ambulation, core strength    PT Home Exercise Plan  to initiate next session (please incorporate hip flexor and adductor stretches in HEP if possible)    Consulted and Agree with Plan of Care  Patient       Patient will benefit from skilled therapeutic intervention in order to improve the following deficits and impairments:  Abnormal gait, Decreased activity tolerance, Decreased balance, Decreased coordination, Decreased endurance, Decreased knowledge of use of DME, Decreased mobility, Decreased range of motion, Decreased safety awareness, Decreased strength, Difficulty walking, Impaired perceived functional ability, Impaired sensation, Impaired tone, Impaired UE functional use, Improper body mechanics, Postural dysfunction  Visit Diagnosis: Muscle weakness (generalized)  Other abnormalities of gait and mobility  Unsteadiness on feet  Radiculopathy, cervical region     Problem List Patient Active Problem List   Diagnosis Date Noted  . Abscess of female pelvis   . SVT (supraventricular tachycardia) ([place])   . Radial styloid tenosynovitis 03/12/2018  . Wheelchair confinement 02/27/2018  . Localized osteoporosis with current pathological fracture with routine healing 01/19/2017  . Wrist fracture 01/16/2017  . Sprain of ankle 03/23/2016  . Closed fracture of lateral malleolus 03/16/2016  . Health care maintenance 01/24/2016  . Blood pressure elevated without history of HTN 10/25/2015  . Essential hypertension 10/25/2015  . Multiple sclerosis ([place]) 10/02/2015  . Chronic left shoulder pain 07/19/2015  . Multiple sclerosis exacerbation ([place]) 07/14/2015  . MS (multiple sclerosis) ([place]) 11/26/2014  . Increased body mass index 11/26/2014  . HPV test positive 11/26/2014  . Status post laparoscopic supracervical hysterectomy 11/26/2014  . Galactorrhea 11/26/2014  . Back ache 05/21/2014  .  Adiposity 05/21/2014  . Disordered sleep 05/21/2014  . Muscle spasticity 05/21/2014  . Spasticity 05/21/2014  . Calculus of kidney 12/09/2013  . Renal colic 79/81/0254  . Hypercholesteremia 08/19/2013  . Hereditary and idiopathic neuropathy 08/19/2013  . Hypercholesterolemia without hypertriglyceridemia 08/19/2013  . Bladder infection, chronic 07/25/2012  . Disorder of bladder function 07/25/2012  . Incomplete bladder emptying 07/25/2012  . Microscopic hematuria 07/25/2012  . Right upper quadrant pain 07/25/2012    Lieutenant Diego PT, DPT 11:26 AM,07/17/19   [place] MAIN [facility] SERVICES [address] [place], [place], [address] Phone: [phone]   Fax:  [phone]  Name: Lisa Williams MRN: [id number] Date of Birth: 09-Mar-1961

## 2019-07-22 ENCOUNTER — Other Ambulatory Visit: Payer: Self-pay

## 2019-07-22 ENCOUNTER — Ambulatory Visit: Payer: PPO

## 2019-07-22 DIAGNOSIS — M6281 Muscle weakness (generalized): Secondary | ICD-10-CM | POA: Diagnosis not present

## 2019-07-22 DIAGNOSIS — R2689 Other abnormalities of gait and mobility: Secondary | ICD-10-CM

## 2019-07-22 DIAGNOSIS — R2681 Unsteadiness on feet: Secondary | ICD-10-CM

## 2019-07-22 NOTE — Therapy (Signed)
[place] MAIN [facility] SERVICES [address] [place], [place], [address] Phone: [phone]   Fax:  [phone]  Physical Therapy Treatment  Patient Details  Name: Lisa Williams MRN: [id number] Date of Birth: Sep 23, 1960 No data recorded  Encounter Date: 07/22/2019  PT End of Session - 07/22/19 1135    Visit Number  159    Number of Visits  164    Date for PT Re-Evaluation  07/29/19    Authorization Type  9/10 PN 06/17/19    PT Start Time  1028    PT Stop Time  1111    PT Time Calculation (min)  43 min    Equipment Utilized During Treatment  Gait belt    Activity Tolerance  Patient tolerated treatment well;Other (comment)    Behavior During Therapy  WFL for tasks assessed/performed       Past Medical History:  Diagnosis Date  . Abdominal pain, right upper quadrant   . Back pain   . Calculus of kidney 12/09/2013  . Chronic back pain    unspecified  . Chronic left shoulder pain 07/19/2015  . Functional disorder of bladder    other  . Galactorrhea 11/26/2014   Chronic   . Hereditary and idiopathic neuropathy 08/19/2013  . HPV test positive   . Hypercholesteremia 08/19/2013  . Incomplete bladder emptying   . Microscopic hematuria   . MS (multiple sclerosis) ([place])   . Muscle spasticity 05/21/2014  . Nonspecific findings on examination of urine    other  . Osteopenia   . Status post laparoscopic supracervical hysterectomy 11/26/2014  . Tobacco user 11/26/2014  . Wrist fracture     Past Surgical History:  Procedure Laterality Date  . bilateral tubal ligation  1996  . BREAST CYST EXCISION Left 2002  . KNEE SURGERY     right  . LAPAROSCOPIC SUPRACERVICAL HYSTERECTOMY  08/05/2013  . ORIF WRIST FRACTURE Left 01/17/2017   Procedure: OPEN REDUCTION INTERNAL FIXATION (ORIF) WRIST FRACTURE;  Surgeon: Lovell Sheehan, MD;  Location: [facility] ORS;  Service: Orthopedics;  Laterality: Left;  . TUBAL LIGATION Bilateral   . VAGINAL HYSTERECTOMY   03/2006    There were no vitals filed for this visit.  Subjective Assessment - 07/22/19 1132    Subjective  Patient reports having frequent spasms of R trunk, has started taking gabapentin but is still imited in standing. No falls just discomfort.    Patient is accompained by:  Family member    Pertinent History  Pt is a 59 y/o F who presents with BLE weakness and difficulty ambulating due to h/o MS that was diagnosed in 1995. Pt denies any pain associated. Pt was admitted to the hospital in November 2018 due to MS exacerbation. Nov-Dec to HHPT-->[facility] until end of Jan. After rehab the pt was able to ambulate up to 100 ft with the RW. Pt has Bil AFO, her therapist at [place] thinks she needs a new AFO as her feet still drag when she ambulates, pt reports HHPT mentioned a spring AFO. Pt has custom AFOs that were originally made 4 years ago. Goals: improve balance, to be able to get into the shower with her tub bench without assist, to be able to get up 2 steps at sister's home with railings on both sides but too far apart to reach both sides, to be able to drive adapted car. Pt is staying at other sister's house with one step to get inside and currently picking  up L leg with UEs and then stepping in. Pt steps down leading with LLE. Pt wants to work on walking side to side as this is challenging and she had not yet progressed to this at rehab. Pt able to get in/out the car with assist only to bring BLEs into the car. Hoping to drive by June or July with adapted car (already has one). Pt needs assist bringing LEs into and out of hospital bed. Pt able to perform sit>stand without assist from [facility] with a pillow on the seat for a boost. Pt denies any falls in the past 6 months. Pt has been increasing her time alone at home up to a few hours at a time to become more independent. Pt has both manual and power WC. Uses power WC at home, uses manual WC out in community. Pt able to self propel manual WC. Pt  needs assist putting pants around ankles but she can pull them up. Pt can put on her shoes but needs help getting her leg up to do so. Pt able to tie her shoelaces on her own. Pt has a tub/shower unit at home with a tub bench. No grab bars in the shower. Has hand held shower head. Pt currently taking a sponge bath. Pt has a regular height toilet with a BSC over top. Pt with h/o L wrist fx with no precautions, pt wore her soft brace to evaluation for comfort but reports her wrist has healed and is doing well following OT.     Limitations  Lifting;Standing;Walking;House hold activities    How long can you sit comfortably?  n/a    How long can you stand comfortably?  3 minutes without UE assist, limited by fatigue    How long can you walk comfortably?  100 ft, limited by fatigue    Patient Stated Goals  see above    Currently in Pain?  Yes    Pain Score  6     Pain Location  Back    Pain Orientation  Right;Mid;Lower    Pain Descriptors / Indicators  Spasm;Tightness    Pain Type  Acute pain    Pain Onset  In the past 7 days    Pain Frequency  Intermittent    Aggravating Factors   transfers, standing    Pain Relieving Factors  stretching, STM    Effect of Pain on Daily Activities  limits transfers and mobility         Patient presents with high levels of R mid/low back pain with limited ability to perform standing and active mobility.   Standing with // bars -sit to stand with increased episodes of R trunk spasm, requires moment to "catch breath", weight shifts L, R, limited to R due to spasm in trunk x 10 -forward step , cueing for exhale rather than holding breath, x 8 each LE, seated rest break between LE's. Standing terminated due to excessive pain in R lateral trunk.   Seated: Assessment of R trunk:  Palpable muscle knots and limited muscle tissue length spanning across R pelvic crest to ~ 1 inch above bra line).   -STM with implementation of effleurage and ptrissage of R  paraspinals, quadratus lumborum, and lat x 12 minutes. Patient reports decreased pain afterwards  PVC pipe: forward stretch with PT guidance, forward pull 10x 15 second holds, lateral with diagonal pull from PT 8x 15 second holds each direction  Swiss ball: forward rollout with prolonged hold 10x 15 second holds,  diagonal to the L reach and hold x 10 x 10 second holds.      Pt educated throughout session about proper posture and technique with exercises. Improved exercise technique, movement at target joints, use of target muscles after min to mod verbal, visual, tactile cues.                      PT Education - 07/22/19 1134    Education provided  Yes    Education Details  exercise technique, body mechanics, trunk stretch, dry needling for future session    Person(s) Educated  Patient    Methods  Explanation;Demonstration;Tactile cues;Verbal cues    Comprehension  Verbalized understanding;Returned demonstration;Verbal cues required;Tactile cues required       PT Short Term Goals - 03/13/19 1733      PT SHORT TERM GOAL #1   Title  Patient will be compliant with HEP 4-5 days/week for improved carryover between sessions.     Baseline  HEP compliancy between sessions 6/17 compliant    Time  2    Period  Weeks    Status  Achieved    Target Date  10/15/18      PT SHORT TERM GOAL #2   Title  Patient will report no falls/LOB to indicate improved stability with mobility     Baseline  6/17: no falls, one episode of instability 7/20: no falls    Time  2    Period  Weeks    Status  Achieved    Target Date  11/06/18        PT Long Term Goals - 06/17/19 0001      PT LONG TERM GOAL #1   Title  Patient will perform TUG in 40 seconds or less with walker to improve safe negotiation of environment.     Baseline  2/9: 38 sec with RW 12/29: 41 seconds with RW 11/5: 45 seconds 10/9: deferred due to pain 5/26: 61 seconds 6/17: 60 seconds 7/20: 59 seconds 8/26:  55 seconds     Time  12    Period  Weeks    Status  Achieved      PT LONG TERM GOAL #2   Title  Pt's 63mT will improve to at least 0.5 m/s for improved safety ambulating in home    Baseline  2/9: 0.241m 12/29: 0.31 m/s 03/13/19: 0.30 m/s 0.14 m/s; 5/15: .2667m 7/10: .36 m/s  8/20: .42 m/s  9/26: 0.41m79m0/14: .26 m/s improved hip and knee clearance 11/15: 0.67m/25mproved hip and knee clearance; 12/5: .29 m/s improved step length and forward moment: decreased foot drag. 05/09/2018: 0.21 m/s 2/6: .237 m/s 5/26: .23 m/s 6/17: .24 m/s  7/20: 0.26 m/s 8/26: 0.26 m/s 10/8: 0.28 m/s    Time  12    Period  Weeks    Status  Partially Met    Target Date  08/12/19      PT LONG TERM GOAL #3   Title  Pt's Bil hip F strength will improve to at least 2+/5 BLEs for improved gait mechanics and safety    Baseline  2/9: Hip abd/add 2+/5, hip flex 2/5, knee flex 3/5, knee ext 3+/5; 12/29:  Hips 2+/5 abduction/adduction, flexion 2/5  Knees 3+/5  11/5: Hips: 2/5 knees 3+/5 10/8: deferred due to pain L: 2/5, R: 1/5 7/10: 2/5 bilaterally  8/20: 2/5 bilat 9/26: 2/5 bilat 10/14: 2/5 bilaterally ,improved hip flexor muscle activation 2/5: improved hip flexor and quad  activation: 12/5: 2/5 ; 05/09/2018: 2/5 2/6: 3/5 knee, 2 to 2+/5 hip 5/26: 2-/5 hips 3/5 knees 6/17: 2-/5 hips 3/5 knees    Time  12    Period  Weeks    Status  Partially Met    Target Date  08/12/19      PT LONG TERM GOAL #4   Title  Pt's ABC Scale will improve to at least 40% to demonstrate improved balance    Baseline  2/9: 30.6% 12/29: 34.7% 11/5: 32.2% 10/9: 32.1% 28.13% 5/15; 24.4% 7/10: 23.4% 8/20: 22. 2% 9/26: 25.3% 10/14: 26.5% 11/15: 24.38% 12/5: 26%; 05/09/2018: 29% 2/6: 23.75% 526: 30% 6/17: 26.8%  7/2: 28% 8/26: 30.3%    Time  12    Period  Weeks    Status  Partially Met    Target Date  09/09/19      PT LONG TERM GOAL #5   Title  Patient will negotiate one step ~4 inches to enter house independently to increase functional independence in natural  environment    Baseline  12/29: 1.9 inch lift after multiple attempts11/5: able to lift 1.75 inch 10/9: has lifted 1.5 inch in previous sessions /20: unable to do 9/26: patient too fatigued/not feeling well to perform 10/14: unable to perform at this time 11/15: unable to perform at this time; 12/5: able to lift 1 inch; 05/09/2018: unable to clear 4" step, able to lift foot ~ 1in 5/26: has been unable to perform 6/17: able to lift foot 1.25 inch 8/26: has been able to step up onto 1.5 inch step in previous sessions    Time  12    Period  Weeks    Status  Deferred      PT LONG TERM GOAL #8   Title  Patient will negotiate tub with mod I to be able to bath/shower in bathroom requiring stability and ability to negotiate small area.     Status  Unable to assess            Plan - 07/22/19 1137    Clinical Impression Statement  Patient presents with high levels of R mid/low back pain with limited ability to perform standing and active mobility. STM combined with prolonged muscle lengthening holds reduced patient pain to 1/10 by end of session. Discussion of use of TDN in future session for reduction of pain performed with patient agreeing to assessment. The patient would benefit from further skilled PT intervention to maximize QOL, safety, and functional mobility    Rehab Potential  Good    PT Frequency  2x / week    PT Duration  12 weeks    PT Treatment/Interventions  ADLs/Self Care Home Management;Aquatic Therapy;Biofeedback;Cryotherapy;Electrical Stimulation;Iontophoresis 57m/ml Dexamethasone;Traction;Moist Heat;Ultrasound;DME Instruction;Gait training;Stair training;Functional mobility training;Therapeutic activities;Therapeutic exercise;Balance training;Neuromuscular re-education;Patient/family education;Orthotic Fit/Training;Wheelchair mobility training;Manual techniques;Compression bandaging;Passive range of motion;Dry needling;Energy conservation;Taping;Splinting    PT Next Visit Plan  progress  note or TDN    PT Home Exercise Plan  to initiate next session (please incorporate hip flexor and adductor stretches in HEP if possible)    Consulted and Agree with Plan of Care  Patient       Patient will benefit from skilled therapeutic intervention in order to improve the following deficits and impairments:  Abnormal gait, Decreased activity tolerance, Decreased balance, Decreased coordination, Decreased endurance, Decreased knowledge of use of DME, Decreased mobility, Decreased range of motion, Decreased safety awareness, Decreased strength, Difficulty walking, Impaired perceived functional ability, Impaired sensation, Impaired tone, Impaired UE functional use, Improper  body mechanics, Postural dysfunction  Visit Diagnosis: Muscle weakness (generalized)  Other abnormalities of gait and mobility  Unsteadiness on feet     Problem List Patient Active Problem List   Diagnosis Date Noted  . Abscess of female pelvis   . SVT (supraventricular tachycardia) ([place])   . Radial styloid tenosynovitis 03/12/2018  . Wheelchair confinement 02/27/2018  . Localized osteoporosis with current pathological fracture with routine healing 01/19/2017  . Wrist fracture 01/16/2017  . Sprain of ankle 03/23/2016  . Closed fracture of lateral malleolus 03/16/2016  . Health care maintenance 01/24/2016  . Blood pressure elevated without history of HTN 10/25/2015  . Essential hypertension 10/25/2015  . Multiple sclerosis ([place]) 10/02/2015  . Chronic left shoulder pain 07/19/2015  . Multiple sclerosis exacerbation ([place]) 07/14/2015  . MS (multiple sclerosis) ([place]) 11/26/2014  . Increased body mass index 11/26/2014  . HPV test positive 11/26/2014  . Status post laparoscopic supracervical hysterectomy 11/26/2014  . Galactorrhea 11/26/2014  . Back ache 05/21/2014  . Adiposity 05/21/2014  . Disordered sleep 05/21/2014  . Muscle spasticity 05/21/2014  . Spasticity 05/21/2014  . Calculus of kidney 12/09/2013   . Renal colic 51/06/5850  . Hypercholesteremia 08/19/2013  . Hereditary and idiopathic neuropathy 08/19/2013  . Hypercholesterolemia without hypertriglyceridemia 08/19/2013  . Bladder infection, chronic 07/25/2012  . Disorder of bladder function 07/25/2012  . Incomplete bladder emptying 07/25/2012  . Microscopic hematuria 07/25/2012  . Right upper quadrant pain 07/25/2012   Janna Arch, PT, DPT   07/22/2019, 11:38 AM  [place] MAIN [facility] SERVICES [address] [place], [place], [address] Phone: [phone]   Fax:  [phone]  Name: Lisa Williams MRN: [id number] Date of Birth: Aug 26, 1960

## 2019-07-24 ENCOUNTER — Other Ambulatory Visit: Payer: Self-pay

## 2019-07-24 ENCOUNTER — Ambulatory Visit: Payer: PPO

## 2019-07-24 DIAGNOSIS — M6281 Muscle weakness (generalized): Secondary | ICD-10-CM | POA: Diagnosis not present

## 2019-07-24 DIAGNOSIS — R2681 Unsteadiness on feet: Secondary | ICD-10-CM

## 2019-07-24 DIAGNOSIS — R2689 Other abnormalities of gait and mobility: Secondary | ICD-10-CM

## 2019-07-24 NOTE — Therapy (Signed)
[place] MAIN [facility] SERVICES [address] [place], [place], [address] Phone: [phone]   Fax:  [phone]  Physical Therapy Treatment Physical Therapy Progress Note   Dates of reporting period  06/17/19  to   07/24/19  Patient Details  Name: Lisa Williams MRN: [id number] Date of Birth: 26-Jul-1960 No data recorded  Encounter Date: 07/24/2019  PT End of Session - 07/24/19 1509    Visit Number  160    Number of Visits  164    Date for PT Re-Evaluation  07/29/19    Authorization Type  10/10 PN 06/17/19; next session 1/10 PN 07/24/19    PT Start Time  1015    PT Stop Time  1104    PT Time Calculation (min)  49 min    Equipment Utilized During Treatment  Gait belt    Activity Tolerance  Patient tolerated treatment well;Other (comment)    Behavior During Therapy  WFL for tasks assessed/performed       Past Medical History:  Diagnosis Date  . Abdominal pain, right upper quadrant   . Back pain   . Calculus of kidney 12/09/2013  . Chronic back pain    unspecified  . Chronic left shoulder pain 07/19/2015  . Functional disorder of bladder    other  . Galactorrhea 11/26/2014   Chronic   . Hereditary and idiopathic neuropathy 08/19/2013  . HPV test positive   . Hypercholesteremia 08/19/2013  . Incomplete bladder emptying   . Microscopic hematuria   . MS (multiple sclerosis) ([place])   . Muscle spasticity 05/21/2014  . Nonspecific findings on examination of urine    other  . Osteopenia   . Status post laparoscopic supracervical hysterectomy 11/26/2014  . Tobacco user 11/26/2014  . Wrist fracture     Past Surgical History:  Procedure Laterality Date  . bilateral tubal ligation  1996  . BREAST CYST EXCISION Left 2002  . KNEE SURGERY     right  . LAPAROSCOPIC SUPRACERVICAL HYSTERECTOMY  08/05/2013  . ORIF WRIST FRACTURE Left 01/17/2017   Procedure: OPEN REDUCTION INTERNAL FIXATION (ORIF) WRIST FRACTURE;  Surgeon: Lovell Sheehan, MD;   Location: [facility] ORS;  Service: Orthopedics;  Laterality: Left;  . TUBAL LIGATION Bilateral   . VAGINAL HYSTERECTOMY  03/2006    There were no vitals filed for this visit.  Subjective Assessment - 07/24/19 1023    Subjective  Patient reports her back spasms have improved but still are limiting her ability to move. No falls or LOB since last session.    Patient is accompained by:  Family member    Pertinent History  Pt is a 59 y/o F who presents with BLE weakness and difficulty ambulating due to h/o MS that was diagnosed in 1995. Pt denies any pain associated. Pt was admitted to the hospital in November 2018 due to MS exacerbation. Nov-Dec to HHPT-->[facility] until end of Jan. After rehab the pt was able to ambulate up to 100 ft with the RW. Pt has Bil AFO, her therapist at [place] thinks she needs a new AFO as her feet still drag when she ambulates, pt reports HHPT mentioned a spring AFO. Pt has custom AFOs that were originally made 4 years ago. Goals: improve balance, to be able to get into the shower with her tub bench without assist, to be able to get up 2 steps at sister's home with railings on both sides but too far apart to reach both sides, to  be able to drive adapted car. Pt is staying at other sister's house with one step to get inside and currently picking up L leg with UEs and then stepping in. Pt steps down leading with LLE. Pt wants to work on walking side to side as this is challenging and she had not yet progressed to this at rehab. Pt able to get in/out the car with assist only to bring BLEs into the car. Hoping to drive by June or July with adapted car (already has one). Pt needs assist bringing LEs into and out of hospital bed. Pt able to perform sit>stand without assist from [facility] with a pillow on the seat for a boost. Pt denies any falls in the past 6 months. Pt has been increasing her time alone at home up to a few hours at a time to become more independent. Pt has both manual and  power WC. Uses power WC at home, uses manual WC out in community. Pt able to self propel manual WC. Pt needs assist putting pants around ankles but she can pull them up. Pt can put on her shoes but needs help getting her leg up to do so. Pt able to tie her shoelaces on her own. Pt has a tub/shower unit at home with a tub bench. No grab bars in the shower. Has hand held shower head. Pt currently taking a sponge bath. Pt has a regular height toilet with a BSC over top. Pt with h/o L wrist fx with no precautions, pt wore her soft brace to evaluation for comfort but reports her wrist has healed and is doing well following OT.     Limitations  Lifting;Standing;Walking;House hold activities    How long can you sit comfortably?  n/a    How long can you stand comfortably?  3 minutes without UE assist, limited by fatigue    How long can you walk comfortably?  100 ft, limited by fatigue    Patient Stated Goals  see above    Currently in Pain?  Yes    Pain Score  4     Pain Location  Back    Pain Orientation  Lower;Mid;Right    Pain Descriptors / Indicators  Spasm    Pain Type  Acute pain    Pain Onset  In the past 7 days    Pain Frequency  Intermittent    Aggravating Factors   transfers, standing    Pain Relieving Factors  stretching, STM            Trigger Point Dry Needling (TDN), unbilled Education performed with patient regarding potential benefit of TDN. Reviewed precautions and risks with patient. Pt provided verbal consent to treatment. TDN performed to R Iliocostalis with 1, 0.30 x 75 single needle placement. Due to body habitus and lateral placement required difficulty reaching muscle so switched to 0.30 x 100 needles. Performed 2 additional placements with deep ache reported by patient. Performed an additional placement to R multifidus with 1, 0.30 x 100 needle around L4 with deep ache reported. Pt reports decrease in muscle spasm in R flank once she returns to sitting. Pistoning technique  utilized. Jason Huprich PT, DPT [facility]   Transition to sitting EOB to prone for needling: mod-max x2 assist for LE's and UE's, prone to sitting EOB mod x2 assist  elevated plinth table: ER/IR- toes touching floor 15x each LE, occasional need for assistance for muscle activation,  LAQ: 15x each LE, challenging with last 4. Rest break  between LE's RTB UE ER 15x ; cues for keeping elbows against body RTB adduction 12x each LE against PT resistance Marching isometric 10x against PT resistance RTB row 15x cues for elbows against sides.    Use of boxing gloves and mitts:  Cross body punches to mitts in PT hands, varying placement of mitts for reaching inside/outside BOS , perturbations to core with return to COM tasking challenging but able to be performed x 45 seconds x 3 trials  Upper cut jab into mitts into PT hands, varying placement of mitts for reaching inside/outside BOS , perturbations to core with return to [facility] tasking challenging but able to be performed x 45 seconds x 2 trials    Patient's condition has the potential to improve in response to therapy. Maximum improvement is yet to be obtained. The anticipated improvement is attainable and reasonable in a generally predictable time.  Patient reports she is able to move better than before her fall. She feels stronger (when not in new pain from back) and wants to be able to negotiate steps.               PT Education - 07/24/19 1025    Education provided  Yes    Education Details  body mechanics, dry needling.    Person(s) Educated  Patient    Methods  Explanation;Demonstration;Tactile cues;Verbal cues    Comprehension  Verbalized understanding;Returned demonstration;Verbal cues required;Tactile cues required       PT Short Term Goals - 03/13/19 1733      PT SHORT TERM GOAL #1   Title  Patient will be compliant with HEP 4-5 days/week for improved carryover between sessions.     Baseline  HEP compliancy between sessions 6/17  compliant    Time  2    Period  Weeks    Status  Achieved    Target Date  10/15/18      PT SHORT TERM GOAL #2   Title  Patient will report no falls/LOB to indicate improved stability with mobility     Baseline  6/17: no falls, one episode of instability 7/20: no falls    Time  2    Period  Weeks    Status  Achieved    Target Date  11/06/18        PT Long Term Goals - 06/17/19 0001      PT LONG TERM GOAL #1   Title  Patient will perform TUG in 40 seconds or less with walker to improve safe negotiation of environment.     Baseline  2/9: 38 sec with RW 12/29: 41 seconds with RW 11/5: 45 seconds 10/9: deferred due to pain 5/26: 61 seconds 6/17: 60 seconds 7/20: 59 seconds 8/26:  55 seconds    Time  12    Period  Weeks    Status  Achieved      PT LONG TERM GOAL #2   Title  Pt's 23mT will improve to at least 0.5 m/s for improved safety ambulating in home    Baseline  2/9: 0.270m 12/29: 0.31 m/s 03/13/19: 0.30 m/s 0.14 m/s; 5/15: .2622m 7/10: .36 m/s  8/20: .42 m/s  9/26: 0.20m65m0/14: .26 m/s improved hip and knee clearance 11/15: 0.75m/57mproved hip and knee clearance; 12/5: .29 m/s improved step length and forward moment: decreased foot drag. 05/09/2018: 0.21 m/s 2/6: .237 m/s 5/26: .23 m/s 6/17: .24 m/s  7/20: 0.26 m/s 8/26: 0.26 m/s 10/8: 0.28 m/s  Time  12    Period  Weeks    Status  Partially Met    Target Date  08/12/19      PT LONG TERM GOAL #3   Title  Pt's Bil hip F strength will improve to at least 2+/5 BLEs for improved gait mechanics and safety    Baseline  2/9: Hip abd/add 2+/5, hip flex 2/5, knee flex 3/5, knee ext 3+/5; 12/29:  Hips 2+/5 abduction/adduction, flexion 2/5  Knees 3+/5  11/5: Hips: 2/5 knees 3+/5 10/8: deferred due to pain L: 2/5, R: 1/5 7/10: 2/5 bilaterally  8/20: 2/5 bilat 9/26: 2/5 bilat 10/14: 2/5 bilaterally ,improved hip flexor muscle activation 2/5: improved hip flexor and quad activation: 12/5: 2/5 ; 05/09/2018: 2/5 2/6: 3/5 knee, 2 to 2+/5 hip  5/26: 2-/5 hips 3/5 knees 6/17: 2-/5 hips 3/5 knees    Time  12    Period  Weeks    Status  Partially Met    Target Date  08/12/19      PT LONG TERM GOAL #4   Title  Pt's ABC Scale will improve to at least 40% to demonstrate improved balance    Baseline  2/9: 30.6% 12/29: 34.7% 11/5: 32.2% 10/9: 32.1% 28.13% 5/15; 24.4% 7/10: 23.4% 8/20: 22. 2% 9/26: 25.3% 10/14: 26.5% 11/15: 24.38% 12/5: 26%; 05/09/2018: 29% 2/6: 23.75% 526: 30% 6/17: 26.8%  7/2: 28% 8/26: 30.3%    Time  12    Period  Weeks    Status  Partially Met    Target Date  09/09/19      PT LONG TERM GOAL #5   Title  Patient will negotiate one step ~4 inches to enter house independently to increase functional independence in natural environment    Baseline  12/29: 1.9 inch lift after multiple attempts11/5: able to lift 1.75 inch 10/9: has lifted 1.5 inch in previous sessions /20: unable to do 9/26: patient too fatigued/not feeling well to perform 10/14: unable to perform at this time 11/15: unable to perform at this time; 12/5: able to lift 1 inch; 05/09/2018: unable to clear 4" step, able to lift foot ~ 1in 5/26: has been unable to perform 6/17: able to lift foot 1.25 inch 8/26: has been able to step up onto 1.5 inch step in previous sessions    Time  12    Period  Weeks    Status  Deferred      PT LONG TERM GOAL #8   Title  Patient will negotiate tub with mod I to be able to bath/shower in bathroom requiring stability and ability to negotiate small area.     Status  Unable to assess            Plan - 07/24/19 1605    Clinical Impression Statement  Goals deferred to next session due to need for dry needling for tremors/spasms in back limiting ability to transfer/ambulate without pain. Core stability and LE strengthening performed in seated with patient demonstrating excellent motivation and progression of ability to maintain COM. Patient's condition has the potential to improve in response to therapy. Maximum improvement is yet  to be obtained. The anticipated improvement is attainable and reasonable in a generally predictable time.  The patient would benefit from further skilled PT intervention to maximize QOL, safety, and functional mobility    Rehab Potential  Good    PT Frequency  2x / week    PT Duration  12 weeks    PT Treatment/Interventions  ADLs/Self  Care Home Management;Aquatic Therapy;Biofeedback;Cryotherapy;Electrical Stimulation;Iontophoresis 74m/ml Dexamethasone;Traction;Moist Heat;Ultrasound;DME Instruction;Gait training;Stair training;Functional mobility training;Therapeutic activities;Therapeutic exercise;Balance training;Neuromuscular re-education;Patient/family education;Orthotic Fit/Training;Wheelchair mobility training;Manual techniques;Compression bandaging;Passive range of motion;Dry needling;Energy conservation;Taping;Splinting    PT Next Visit Plan  goals    PT Home Exercise Plan  to initiate next session (please incorporate hip flexor and adductor stretches in HEP if possible)    Consulted and Agree with Plan of Care  Patient       Patient will benefit from skilled therapeutic intervention in order to improve the following deficits and impairments:  Abnormal gait, Decreased activity tolerance, Decreased balance, Decreased coordination, Decreased endurance, Decreased knowledge of use of DME, Decreased mobility, Decreased range of motion, Decreased safety awareness, Decreased strength, Difficulty walking, Impaired perceived functional ability, Impaired sensation, Impaired tone, Impaired UE functional use, Improper body mechanics, Postural dysfunction  Visit Diagnosis: Muscle weakness (generalized)  Other abnormalities of gait and mobility  Unsteadiness on feet     Problem List Patient Active Problem List   Diagnosis Date Noted  . Abscess of female pelvis   . SV[place]cardia) [place]   . Radial styloid tenosynovitis 03/12/2018  . Wheelchair confinement 02/27/2018  .  Localized osteoporosis with current pathological fracture with routine healing 01/19/2017  . Wrist fracture 01/16/2017  . Sprain of ankle 03/23/2016  . Closed fracture of lateral malleolus 03/16/2016  . Health care maintenance 01/24/2016  . Blood pressure elevated without history of HTN 10/25/2015  . Essential hypertension 10/25/2015  . Multiple sclerosis ([place] 10/02/2015  . Chronic left shoulder pain 07/19/2015  . [place] exacerbation ([place] 07/14/2015  . [place] 11/26/2014  . Increased body mass index 11/26/2014  . HPV test positive 11/26/2014  . Status post laparoscopic supracervical hysterectomy 11/26/2014  . Galactorrhea 11/26/2014  . Back ache 05/21/2014  . Adiposity 05/21/2014  . Disordered sleep 05/21/2014  . Muscle spasticity 05/21/2014  . Spasticity 05/21/2014  . Calculus of kidney 12/09/2013  . Renal colic 094/50/3888 . Hypercholesteremia 08/19/2013  . Hereditary and idiopathic neuropathy 08/19/2013  . Hypercholesterolemia without hypertriglyceridemia 08/19/2013  . Bladder infection, chronic 07/25/2012  . Disorder of bladder function 07/25/2012  . Incomplete bladder emptying 07/25/2012  . Microscopic hematuria 07/25/2012  . Right upper quadrant pain 07/25/2012   MJanna Arch PT, DPT   07/24/2019, 4:06 PM  [place] [address] [address]Phone: [phone]  Fax:  [phone] Name: Lisa StanderRamseur MRN: [id number] of Birth: 616-Dec-1962

## 2019-07-29 ENCOUNTER — Ambulatory Visit: Payer: PPO

## 2019-07-29 ENCOUNTER — Other Ambulatory Visit: Payer: Self-pay

## 2019-07-29 DIAGNOSIS — R2689 Other abnormalities of gait and mobility: Secondary | ICD-10-CM

## 2019-07-29 DIAGNOSIS — M6281 Muscle weakness (generalized): Secondary | ICD-10-CM | POA: Diagnosis not present

## 2019-07-29 DIAGNOSIS — R2681 Unsteadiness on feet: Secondary | ICD-10-CM

## 2019-07-29 NOTE — Therapy (Signed)
[place] MAIN [facility] SERVICES [address] [place], [place], [address] Phone: [phone]   Fax:  [phone]  Physical Therapy Treatment/ RECERT  Patient Details  Name: Lisa Williams MRN: [id number] Date of Birth: Aug 09, 1960 No data recorded  Encounter Date: 07/29/2019  PT End of Session - 07/29/19 1152    Visit Number  161    Number of Visits  185    Date for PT Re-Evaluation  10/21/19    Authorization Type  1/10 PN 07/24/19    PT Start Time  1019    PT Stop Time  1105    PT Time Calculation (min)  46 min    Equipment Utilized During Treatment  Gait belt    Activity Tolerance  Patient tolerated treatment well;Other (comment)    Behavior During Therapy  WFL for tasks assessed/performed       Past Medical History:  Diagnosis Date  . Abdominal pain, right upper quadrant   . Back pain   . Calculus of kidney 12/09/2013  . Chronic back pain    unspecified  . Chronic left shoulder pain 07/19/2015  . Functional disorder of bladder    other  . Galactorrhea 11/26/2014   Chronic   . Hereditary and idiopathic neuropathy 08/19/2013  . HPV test positive   . Hypercholesteremia 08/19/2013  . Incomplete bladder emptying   . Microscopic hematuria   . MS (multiple sclerosis) ([place])   . Muscle spasticity 05/21/2014  . Nonspecific findings on examination of urine    other  . Osteopenia   . Status post laparoscopic supracervical hysterectomy 11/26/2014  . Tobacco user 11/26/2014  . Wrist fracture     Past Surgical History:  Procedure Laterality Date  . bilateral tubal ligation  1996  . BREAST CYST EXCISION Left 2002  . KNEE SURGERY     right  . LAPAROSCOPIC SUPRACERVICAL HYSTERECTOMY  08/05/2013  . ORIF WRIST FRACTURE Left 01/17/2017   Procedure: OPEN REDUCTION INTERNAL FIXATION (ORIF) WRIST FRACTURE;  Surgeon: Lovell Sheehan, MD;  Location: [facility] ORS;  Service: Orthopedics;  Laterality: Left;  . TUBAL LIGATION Bilateral   . VAGINAL  HYSTERECTOMY  03/2006    There were no vitals filed for this visit.  Subjective Assessment - 07/29/19 1150    Subjective  Patient reports no falls or LOB since last session. Has been feeling better since she was dry needled.    Patient is accompained by:  Family member    Pertinent History  Pt is a 59 y/o F who presents with BLE weakness and difficulty ambulating due to h/o MS that was diagnosed in 1995. Pt denies any pain associated. Pt was admitted to the hospital in November 2018 due to MS exacerbation. Nov-Dec to HHPT-->[facility] until end of Jan. After rehab the pt was able to ambulate up to 100 ft with the RW. Pt has Bil AFO, her therapist at [facility] thinks she needs a new AFO as her feet still drag when she ambulates, pt reports HHPT mentioned a spring AFO. Pt has custom AFOs that were originally made 4 years ago. Goals: improve balance, to be able to get into the shower with her tub bench without assist, to be able to get up 2 steps at sister's home with railings on both sides but too far apart to reach both sides, to be able to drive adapted car. Pt is staying at other sister's house with one step to get inside and currently picking up L leg  with UEs and then stepping in. Pt steps down leading with LLE. Pt wants to work on walking side to side as this is challenging and she had not yet progressed to this at rehab. Pt able to get in/out the car with assist only to bring BLEs into the car. Hoping to drive by June or July with adapted car (already has one). Pt needs assist bringing LEs into and out of hospital bed. Pt able to perform sit>stand without assist from [facility] with a pillow on the seat for a boost. Pt denies any falls in the past 6 months. Pt has been increasing her time alone at home up to a few hours at a time to become more independent. Pt has both manual and power WC. Uses power WC at home, uses manual WC out in community. Pt able to self propel manual WC. Pt needs assist putting pants  around ankles but she can pull them up. Pt can put on her shoes but needs help getting her leg up to do so. Pt able to tie her shoelaces on her own. Pt has a tub/shower unit at home with a tub bench. No grab bars in the shower. Has hand held shower head. Pt currently taking a sponge bath. Pt has a regular height toilet with a BSC over top. Pt with h/o L wrist fx with no precautions, pt wore her soft brace to evaluation for comfort but reports her wrist has healed and is doing well following OT.     Limitations  Lifting;Standing;Walking;House hold activities    How long can you sit comfortably?  n/a    How long can you stand comfortably?  3 minutes without UE assist, limited by fatigue    How long can you walk comfortably?  100 ft, limited by fatigue    Patient Stated Goals  see above    Currently in Pain?  No/denies           Goals:  10 MWT=0.29 m/s with RW  ( improvement from February )  TUG: 35 seconds  Strength: see flowsheet ABC= 36.6%  1 step: 3 in against wall/ unable to go onto step  Tub : still waiting on shower chair     Treatment:  Isometric against PT hands: 12x 5-8 second holds -adduction; against PT hands bilateral at the same time -abduction; against PT hands, bilateral at the same time -hip flexion ; unilateral , one LE at a time against PT hand  GTB alternating ER with thumbs up with pronation      Pt educated throughout session about proper posture and technique with exercises. Improved exercise technique, movement at target joints, use of target muscles after min to mod verbal, visual, tactile cues. Seated rest breaks and use of ice pack utilized to keep body temperature at functional range to reduce MS exacerbation.   Patient has met her TUG goal allowing her to progress that goal to a more functional speed. Her ability to clear her feet with ambulation with decreased reliance upon walker and UE's is allowing for increased velocity and stability with mobility  and transfers. 10 MWT gait speed is improving from previous month as well as patients self reflected stability. Her ability to clear an object to progress towards negotiation of steps is improving however continues to be an area of progress. The patient would benefit from further skilled PT intervention to maximize QOL, safety, and functional mobility.  PT Education - 07/29/19 1151    Education provided  Yes    Education Details  goals, Geophysicist/field seismologist) Educated  Patient    Methods  Explanation;Demonstration;Tactile cues;Verbal cues    Comprehension  Verbalized understanding;Returned demonstration;Verbal cues required;Tactile cues required       PT Short Term Goals - 07/29/19 1203      PT SHORT TERM GOAL #1   Title  Patient will be compliant with HEP 4-5 days/week for improved carryover between sessions.     Baseline  HEP compliancy between sessions 6/17 compliant    Time  2    Period  Weeks    Status  Achieved    Target Date  10/15/18      PT SHORT TERM GOAL #2   Title  Patient will report no falls/LOB to indicate improved stability with mobility     Baseline  6/17: no falls, one episode of instability 7/20: no falls    Time  2    Period  Weeks    Status  Achieved    Target Date  11/06/18        PT Long Term Goals - 07/29/19 0001      PT LONG TERM GOAL #1   Title  Patient will perform TUG in 40 seconds or less with walker to improve safe negotiation of environment.     Baseline  2/9: 38 sec with RW 12/29: 41 seconds with RW 11/5: 45 seconds 10/9: deferred due to pain 5/26: 61 seconds 6/17: 60 seconds 7/20: 59 seconds 8/26:  55 seconds    Time  12    Period  Weeks    Status  Achieved      PT LONG TERM GOAL #2   Title  Pt's 22mT will improve to at least 0.5 m/s for improved safety ambulating in home    Baseline  07/29/19: 0.29 m/s 2/9: 0.220m 12/29: 0.31 m/s 03/13/19: 0.30 m/s 0.14 m/s; 5/15: .2655m 7/10: .36 m/s  8/20: .42 m/s  9/26:  0.67m81m0/14: .26 m/s improved hip and knee clearance 11/15: 0.39m/12mproved hip and knee clearance; 12/5: .29 m/s improved step length and forward moment: decreased foot drag. 05/09/2018: 0.21 m/s 2/6: .237 m/s 5/26: .23 m/s 6/17: .24 m/s  7/20: 0.26 m/s 8/26: 0.26 m/s 10/8: 0.28 m/s    Time  12    Period  Weeks    Status  Partially Met    Target Date  10/21/19      PT LONG TERM GOAL #3   Title  Pt's Bil hip F strength will improve to at least 2+/5 BLEs for improved gait mechanics and safety    Baseline  2/9: Hip abd/add 2+/5, hip flex 2/5, knee flex 3/5, knee ext 3+/5; 12/29:  Hips 2+/5 abduction/adduction, flexion 2/5  Knees 3+/5  11/5: Hips: 2/5 knees 3+/5 10/8: deferred due to pain L: 2/5, R: 1/5 7/10: 2/5 bilaterally  8/20: 2/5 bilat 9/26: 2/5 bilat 10/14: 2/5 bilaterally ,improved hip flexor muscle activation 2/5: improved hip flexor and quad activation: 12/5: 2/5 ; 05/09/2018: 2/5 2/6: 3/5 knee, 2 to 2+/5 hip 5/26: 2-/5 hips 3/5 knees 6/17: 2-/5 hips 3/5 knees    Time  12    Period  Weeks    Status  Partially Met    Target Date  10/21/19      PT LONG TERM GOAL #4   Title  Pt's ABC Scale will improve to at least 40% to demonstrate  improved balance    Baseline  07/29/19: 36.6% 2/9: 30.6% 12/29: 34.7% 11/5: 32.2% 10/9: 32.1% 28.13% 5/15; 24.4% 7/10: 23.4% 8/20: 22. 2% 9/26: 25.3% 10/14: 26.5% 11/15: 24.38% 12/5: 26%; 05/09/2018: 29% 2/6: 23.75% 526: 30% 6/17: 26.8%  7/2: 28% 8/26: 30.3%    Time  12    Period  Weeks    Status  Partially Met    Target Date  10/21/19      PT LONG TERM GOAL #5   Title  Patient will negotiate one step ~4 inches to enter house independently to increase functional independence in natural environment    Baseline  07/29/19: 3 inch against wall 12/29: 1.9 inch lift after multiple attempts11/5: able to lift 1.75 inch 10/9: has lifted 1.5 inch in previous sessions /20: unable to do 9/26: patient too fatigued/not feeling well to perform 10/14: unable to perform at this  time 11/15: unable to perform at this time; 12/5: able to lift 1 inch; 05/09/2018: unable to clear 4" step, able to lift foot ~ 1in 5/26: has been unable to perform 6/17: able to lift foot 1.25 inch 8/26: has been able to step up onto 1.5 inch step in previous sessions    Time  12    Period  Weeks    Status  New    Target Date  10/21/19      PT LONG TERM GOAL #6   Title  Patient will negotiate tub with mod I to be able to bath/shower in bathroom requiring stability and ability to negotiate small area.     Baseline  07/29/19: awaiting new shower chair 12/29: unable to step over current step safely; has to sponge bath standing up in room 11/5: has to sponge bath standing up in room     Time  12    Period  Weeks    Status  Partially Met      PT LONG TERM GOAL #7   Title  Patient will perform TUG in 20 seconds or less with walker to improve safe negotiation of environment.     Baseline  07/29/19: 35 seconds with RW     Time  12    Period  Weeks    Status  New    Target Date  10/21/19            Plan - 07/29/19 1225    Clinical Impression Statement  Patient has met her TUG goal allowing her to progress that goal to a more functional speed. Her ability to clear her feet with ambulation with decreased reliance upon walker and UE's is allowing for increased velocity and stability with mobility and transfers. 10 MWT gait speed is improving from previous month as well as patients self reflected stability. Her ability to clear an object to progress towards negotiation of steps is improving however continues to be an area of progress. The patient would benefit from further skilled PT intervention to maximize QOL, safety, and functional mobility.    Rehab Potential  Good    PT Frequency  2x / week    PT Duration  12 weeks    PT Treatment/Interventions  ADLs/Self Care Home Management;Aquatic Therapy;Biofeedback;Cryotherapy;Electrical Stimulation;Iontophoresis 54m/ml Dexamethasone;Traction;Moist  Heat;Ultrasound;DME Instruction;Gait training;Stair training;Functional mobility training;Therapeutic activities;Therapeutic exercise;Balance training;Neuromuscular re-education;Patient/family education;Orthotic Fit/Training;Wheelchair mobility training;Manual techniques;Compression bandaging;Passive range of motion;Dry needling;Energy conservation;Taping;Splinting    PT Next Visit Plan  step negotiation    PT Home Exercise Plan  to initiate next session (please incorporate hip flexor and adductor stretches in  HEP if possible)    Consulted and Agree with Plan of Care  Patient       Patient will benefit from skilled therapeutic intervention in order to improve the following deficits and impairments:  Abnormal gait, Decreased activity tolerance, Decreased balance, Decreased coordination, Decreased endurance, Decreased knowledge of use of DME, Decreased mobility, Decreased range of motion, Decreased safety awareness, Decreased strength, Difficulty walking, Impaired perceived functional ability, Impaired sensation, Impaired tone, Impaired UE functional use, Improper body mechanics, Postural dysfunction  Visit Diagnosis: Muscle weakness (generalized)  Other abnormalities of gait and mobility  Unsteadiness on feet     Problem List Patient Active Problem List   Diagnosis Date Noted  . Abscess of female pelvis   . SVT (supraventricular tachycardia) ([place])   . Radial styloid tenosynovitis 03/12/2018  . Wheelchair confinement 02/27/2018  . Localized osteoporosis with current pathological fracture with routine healing 01/19/2017  . Wrist fracture 01/16/2017  . Sprain of ankle 03/23/2016  . Closed fracture of lateral malleolus 03/16/2016  . Health care maintenance 01/24/2016  . Blood pressure elevated without history of HTN 10/25/2015  . Essential hypertension 10/25/2015  . Multiple sclerosis ([place]) 10/02/2015  . Chronic left shoulder pain 07/19/2015  . Multiple sclerosis exacerbation ([place])  07/14/2015  . MS (multiple sclerosis) ([place]) 11/26/2014  . Increased body mass index 11/26/2014  . HPV test positive 11/26/2014  . Status post laparoscopic supracervical hysterectomy 11/26/2014  . Galactorrhea 11/26/2014  . Back ache 05/21/2014  . Adiposity 05/21/2014  . Disordered sleep 05/21/2014  . Muscle spasticity 05/21/2014  . Spasticity 05/21/2014  . Calculus of kidney 12/09/2013  . Renal colic 57/33/4483  . Hypercholesteremia 08/19/2013  . Hereditary and idiopathic neuropathy 08/19/2013  . Hypercholesterolemia without hypertriglyceridemia 08/19/2013  . Bladder infection, chronic 07/25/2012  . Disorder of bladder function 07/25/2012  . Incomplete bladder emptying 07/25/2012  . Microscopic hematuria 07/25/2012  . Right upper quadrant pain 07/25/2012   Janna Arch, PT, DPT   07/29/2019, 12:26 PM  [place] MAIN [facility] SERVICES [address] [place], [place], [address] Phone: [phone]   Fax:  [phone]  Name: Lisa Williams MRN: [id number] Date of Birth: 26-Jan-1961

## 2019-07-31 ENCOUNTER — Other Ambulatory Visit: Payer: Self-pay

## 2019-07-31 ENCOUNTER — Ambulatory Visit: Payer: PPO

## 2019-07-31 DIAGNOSIS — R531 Weakness: Secondary | ICD-10-CM | POA: Diagnosis not present

## 2019-07-31 DIAGNOSIS — N319 Neuromuscular dysfunction of bladder, unspecified: Secondary | ICD-10-CM | POA: Diagnosis not present

## 2019-07-31 DIAGNOSIS — Z832 Family history of diseases of the blood and blood-forming organs and certain disorders involving the immune mechanism: Secondary | ICD-10-CM | POA: Diagnosis not present

## 2019-07-31 DIAGNOSIS — Z20822 Contact with and (suspected) exposure to covid-19: Secondary | ICD-10-CM | POA: Diagnosis not present

## 2019-07-31 DIAGNOSIS — Z841 Family history of disorders of kidney and ureter: Secondary | ICD-10-CM | POA: Diagnosis not present

## 2019-07-31 DIAGNOSIS — G609 Hereditary and idiopathic neuropathy, unspecified: Secondary | ICD-10-CM | POA: Diagnosis not present

## 2019-07-31 DIAGNOSIS — G35 Multiple sclerosis: Secondary | ICD-10-CM | POA: Diagnosis not present

## 2019-07-31 DIAGNOSIS — I10 Essential (primary) hypertension: Secondary | ICD-10-CM | POA: Diagnosis not present

## 2019-07-31 DIAGNOSIS — Z83438 Family history of other disorder of lipoprotein metabolism and other lipidemia: Secondary | ICD-10-CM | POA: Diagnosis not present

## 2019-07-31 DIAGNOSIS — Z800 Family history of malignant neoplasm of digestive organs: Secondary | ICD-10-CM | POA: Diagnosis not present

## 2019-07-31 DIAGNOSIS — N393 Stress incontinence (female) (male): Secondary | ICD-10-CM | POA: Diagnosis not present

## 2019-07-31 DIAGNOSIS — R2681 Unsteadiness on feet: Secondary | ICD-10-CM

## 2019-07-31 DIAGNOSIS — M6281 Muscle weakness (generalized): Secondary | ICD-10-CM

## 2019-07-31 DIAGNOSIS — R001 Bradycardia, unspecified: Secondary | ICD-10-CM | POA: Diagnosis not present

## 2019-07-31 DIAGNOSIS — R29898 Other symptoms and signs involving the musculoskeletal system: Secondary | ICD-10-CM | POA: Diagnosis not present

## 2019-07-31 DIAGNOSIS — Z79899 Other long term (current) drug therapy: Secondary | ICD-10-CM | POA: Diagnosis not present

## 2019-07-31 DIAGNOSIS — N3289 Other specified disorders of bladder: Secondary | ICD-10-CM | POA: Diagnosis not present

## 2019-07-31 DIAGNOSIS — Z803 Family history of malignant neoplasm of breast: Secondary | ICD-10-CM | POA: Diagnosis not present

## 2019-07-31 DIAGNOSIS — M8580 Other specified disorders of bone density and structure, unspecified site: Secondary | ICD-10-CM | POA: Diagnosis not present

## 2019-07-31 DIAGNOSIS — Z87891 Personal history of nicotine dependence: Secondary | ICD-10-CM | POA: Diagnosis not present

## 2019-07-31 DIAGNOSIS — Z8041 Family history of malignant neoplasm of ovary: Secondary | ICD-10-CM | POA: Diagnosis not present

## 2019-07-31 DIAGNOSIS — Z8249 Family history of ischemic heart disease and other diseases of the circulatory system: Secondary | ICD-10-CM | POA: Diagnosis not present

## 2019-07-31 DIAGNOSIS — Z8042 Family history of malignant neoplasm of prostate: Secondary | ICD-10-CM | POA: Diagnosis not present

## 2019-07-31 DIAGNOSIS — R5381 Other malaise: Secondary | ICD-10-CM | POA: Diagnosis not present

## 2019-07-31 DIAGNOSIS — R339 Retention of urine, unspecified: Secondary | ICD-10-CM | POA: Diagnosis not present

## 2019-07-31 DIAGNOSIS — R2689 Other abnormalities of gait and mobility: Secondary | ICD-10-CM

## 2019-07-31 DIAGNOSIS — Z87442 Personal history of urinary calculi: Secondary | ICD-10-CM | POA: Diagnosis not present

## 2019-07-31 DIAGNOSIS — Z833 Family history of diabetes mellitus: Secondary | ICD-10-CM | POA: Diagnosis not present

## 2019-07-31 NOTE — Therapy (Signed)
[place] [facility] SERVICES [address] [place], [place], [address] Phone: [phone]   Fax:  [phone]  Physical Therapy Treatment  Patient Details  Name: Lisa Williams MRN: [id number] Date of Birth: March 01, 1961 No data recorded  Encounter Date: 07/31/2019  PT End of Session - 07/31/19 1315    Visit Number  [id number]    Number of Visits  185    Date for PT Re-Evaluation  10/21/19    Authorization Type  2/10 PN 07/24/19    PT Start Time  1020    PT Stop Time  1102    PT Time Calculation (min)  42 min    Equipment Utilized During Treatment  Gait belt    Activity Tolerance  Patient tolerated treatment well;Other (comment)    Behavior During Therapy  WFL for tasks assessed/performed       Past Medical History:  Diagnosis Date  . Abdominal pain, right upper quadrant   . Back pain   . Calculus of kidney 12/09/2013  . Chronic back pain    unspecified  . Chronic left shoulder pain 07/19/2015  . Functional disorder of bladder    other  . Galactorrhea 11/26/2014   Chronic   . Hereditary and idiopathic neuropathy 08/19/2013  . HPV test positive   . Hypercholesteremia 08/19/2013  . Incomplete bladder emptying   . Microscopic hematuria   . MS (multiple sclerosis) ([place])   . Muscle spasticity 05/21/2014  . Nonspecific findings on examination of urine    other  . Osteopenia   . Status post laparoscopic supracervical hysterectomy 11/26/2014  . Tobacco user 11/26/2014  . Wrist fracture     Past Surgical History:  Procedure Laterality Date  . bilateral tubal ligation  1996  . BREAST CYST EXCISION Left 2002  . KNEE SURGERY     right  . LAPAROSCOPIC SUPRACERVICAL HYSTERECTOMY  08/05/2013  . ORIF WRIST FRACTURE Left 01/17/2017   Procedure: OPEN REDUCTION INTERNAL FIXATION (ORIF) WRIST FRACTURE;  Surgeon: Lovell Sheehan, MD;  Location: [facility] ORS;  Service: Orthopedics;  Laterality: Left;  . TUBAL LIGATION Bilateral   . VAGINAL  HYSTERECTOMY  03/2006    There were no vitals filed for this visit.  Subjective Assessment - 07/31/19 1313    Subjective  Patient reports she had a hot flash yesterday but was able to cool herself down. Felt ok when she woke up this morning. No falls or LOB, is getting her second COVID shot saturday    Patient is accompained by:  Family member    Pertinent History  Pt is a 59 y/o F who presents with BLE weakness and difficulty ambulating due to h/o MS that was diagnosed in 1995. Pt denies any pain associated. Pt was admitted to the hospital in November 2018 due to MS exacerbation. Nov-Dec to HHPT-->[facility] until end of Jan. After rehab the pt was able to ambulate up to 100 ft with the RW. Pt has Bil AFO, her therapist at [facility] thinks she needs a new AFO as her feet still drag when she ambulates, pt reports HHPT mentioned a spring AFO. Pt has custom AFOs that were originally made 4 years ago. Goals: improve balance, to be able to get into the shower with her tub bench without assist, to be able to get up 2 steps at sister's home with railings on both sides but too far apart to reach both sides, to be able to drive adapted car. Pt is staying at  other sister's house with one step to get inside and currently picking up L leg with UEs and then stepping in. Pt steps down leading with LLE. Pt wants to work on walking side to side as this is challenging and she had not yet progressed to this at rehab. Pt able to get in/out the car with assist only to bring BLEs into the car. Hoping to drive by June or July with adapted car (already has one). Pt needs assist bringing LEs into and out of hospital bed. Pt able to perform sit>stand without assist from [facility] with a pillow on the seat for a boost. Pt denies any falls in the past 6 months. Pt has been increasing her time alone at home up to a few hours at a time to become more independent. Pt has both manual and power WC. Uses power WC at home, uses manual WC out  in community. Pt able to self propel manual WC. Pt needs assist putting pants around ankles but she can pull them up. Pt can put on her shoes but needs help getting her leg up to do so. Pt able to tie her shoelaces on her own. Pt has a tub/shower unit at home with a tub bench. No grab bars in the shower. Has hand held shower head. Pt currently taking a sponge bath. Pt has a regular height toilet with a BSC over top. Pt with h/o L wrist fx with no precautions, pt wore her soft brace to evaluation for comfort but reports her wrist has healed and is doing well following OT.     Limitations  Lifting;Standing;Walking;House hold activities    How long can you sit comfortably?  n/a    How long can you stand comfortably?  3 minutes without UE assist, limited by fatigue    How long can you walk comfortably?  100 ft, limited by fatigue    Patient Stated Goals  see above    Currently in Pain?  No/denies          In // bars: CGA with cueing for body mechanics and sequencing for stabilization and mobility   weight shift in // bars 15x each side   Bilateral knee flexion into theraband with UE support 8x each LE  Step over and back theraband on floor with focus on hip flexion and foot clearance 8x bilaterally, seated rest break between LE switch.  Step forward and backwards 8x each LE with heavy BUE support ; seated rest break between LE's    Swiss ball seated:  Press down between UE's LE's for TrA activation 15x ; 3 second holds Straight arm raises 10x  Cross body D2 pattern 10x each direction focus on thumb up to thumb across for additional arc of motion  tricep upward press 15x          Seated rest breaks and use of ice pack utilized to keep body temperature at functional range to reduce MS overheating exacerbation.      Pt educated throughout session about proper posture and technique with exercises. Improved exercise technique, movement at target joints, use of target muscles after min to mod  verbal, visual, tactile cues.                         PT Education - 07/31/19 1314    Education provided  Yes    Education Details  Economist, exercise technique,    Person(s) Educated  Patient  Methods  Explanation;Demonstration;Tactile cues;Verbal cues    Comprehension  Verbalized understanding;Returned demonstration;Verbal cues required;Tactile cues required       PT Short Term Goals - 07/29/19 1203      PT SHORT TERM GOAL #1   Title  Patient will be compliant with HEP 4-5 days/week for improved carryover between sessions.     Baseline  HEP compliancy between sessions 6/17 compliant    Time  2    Period  Weeks    Status  Achieved    Target Date  10/15/18      PT SHORT TERM GOAL #2   Title  Patient will report no falls/LOB to indicate improved stability with mobility     Baseline  6/17: no falls, one episode of instability 7/20: no falls    Time  2    Period  Weeks    Status  Achieved    Target Date  11/06/18        PT Long Term Goals - 07/29/19 0001      PT LONG TERM GOAL #1   Title  Patient will perform TUG in 40 seconds or less with walker to improve safe negotiation of environment.     Baseline  2/9: 38 sec with RW 12/29: 41 seconds with RW 11/5: 45 seconds 10/9: deferred due to pain 5/26: 61 seconds 6/17: 60 seconds 7/20: 59 seconds 8/26:  55 seconds    Time  12    Period  Weeks    Status  Achieved      PT LONG TERM GOAL #2   Title  Pt's 84mT will improve to at least 0.5 m/s for improved safety ambulating in home    Baseline  07/29/19: 0.29 m/s 2/9: 0.261m 12/29: 0.31 m/s 03/13/19: 0.30 m/s 0.14 m/s; 5/15: .2655m 7/10: .36 m/s  8/20: .42 m/s  9/26: 0.55m53m0/14: .26 m/s improved hip and knee clearance 11/15: 0.24m/59mproved hip and knee clearance; 12/5: .29 m/s improved step length and forward moment: decreased foot drag. 05/09/2018: 0.21 m/s 2/6: .237 m/s 5/26: .23 m/s 6/17: .24 m/s  7/20: 0.26 m/s 8/26: 0.26 m/s 10/8: 0.28 m/s    Time   12    Period  Weeks    Status  Partially Met    Target Date  10/21/19      PT LONG TERM GOAL #3   Title  Pt's Bil hip F strength will improve to at least 2+/5 BLEs for improved gait mechanics and safety    Baseline  2/9: Hip abd/add 2+/5, hip flex 2/5, knee flex 3/5, knee ext 3+/5; 12/29:  Hips 2+/5 abduction/adduction, flexion 2/5  Knees 3+/5  11/5: Hips: 2/5 knees 3+/5 10/8: deferred due to pain L: 2/5, R: 1/5 7/10: 2/5 bilaterally  8/20: 2/5 bilat 9/26: 2/5 bilat 10/14: 2/5 bilaterally ,improved hip flexor muscle activation 2/5: improved hip flexor and quad activation: 12/5: 2/5 ; 05/09/2018: 2/5 2/6: 3/5 knee, 2 to 2+/5 hip 5/26: 2-/5 hips 3/5 knees 6/17: 2-/5 hips 3/5 knees    Time  12    Period  Weeks    Status  Partially Met    Target Date  10/21/19      PT LONG TERM GOAL #4   Title  Pt's ABC Scale will improve to at least 40% to demonstrate improved balance    Baseline  07/29/19: 36.6% 2/9: 30.6% 12/29: 34.7% 11/5: 32.2% 10/9: 32.1% 28.13% 5/15; 24.4% 7/10: 23.4% 8/20: 22. 2% 9/26: 25.3% 10/14: 26.5% 11/15: 24.38%  12/5: 26%; 05/09/2018: 29% 2/6: 23.75% 526: 30% 6/17: 26.8%  7/2: 28% 8/26: 30.3%    Time  12    Period  Weeks    Status  Partially Met    Target Date  10/21/19      PT LONG TERM GOAL #5   Title  Patient will negotiate one step ~4 inches to enter house independently to increase functional independence in natural environment    Baseline  07/29/19: 3 inch against wall 12/29: 1.9 inch lift after multiple attempts11/5: able to lift 1.75 inch 10/9: has lifted 1.5 inch in previous sessions /20: unable to do 9/26: patient too fatigued/not feeling well to perform 10/14: unable to perform at this time 11/15: unable to perform at this time; 12/5: able to lift 1 inch; 05/09/2018: unable to clear 4" step, able to lift foot ~ 1in 5/26: has been unable to perform 6/17: able to lift foot 1.25 inch 8/26: has been able to step up onto 1.5 inch step in previous sessions    Time  12    Period  Weeks     Status  New    Target Date  10/21/19      PT LONG TERM GOAL #6   Title  Patient will negotiate tub with mod I to be able to bath/shower in bathroom requiring stability and ability to negotiate small area.     Baseline  07/29/19: awaiting new shower chair 12/29: unable to step over current step safely; has to sponge bath standing up in room 11/5: has to sponge bath standing up in room     Time  12    Period  Weeks    Status  Partially Met      PT LONG TERM GOAL #7   Title  Patient will perform TUG in 20 seconds or less with walker to improve safe negotiation of environment.     Baseline  07/29/19: 35 seconds with RW     Time  12    Period  Weeks    Status  New    Target Date  10/21/19            Plan - 07/31/19 1317    Clinical Impression Statement  Patient tolerated increased repetition of seated and standing interventions indicating improved capacity for functional mobility. Patient continues to be challenged with foot clearance for step negotiation however is improving slowly. The patient would benefit from further skilled PT intervention to maximize QOL, safety, and functional mobility    Rehab Potential  Good    PT Frequency  2x / week    PT Duration  12 weeks    PT Treatment/Interventions  ADLs/Self Care Home Management;Aquatic Therapy;Biofeedback;Cryotherapy;Electrical Stimulation;Iontophoresis 23m/ml Dexamethasone;Traction;Moist Heat;Ultrasound;DME Instruction;Gait training;Stair training;Functional mobility training;Therapeutic activities;Therapeutic exercise;Balance training;Neuromuscular re-education;Patient/family education;Orthotic Fit/Training;Wheelchair mobility training;Manual techniques;Compression bandaging;Passive range of motion;Dry needling;Energy conservation;Taping;Splinting    PT Next Visit Plan  step negotiation    PT Home Exercise Plan  to initiate next session (please incorporate hip flexor and adductor stretches in HEP if possible)    Consulted and Agree  with Plan of Care  Patient       Patient will benefit from skilled therapeutic intervention in order to improve the following deficits and impairments:  Abnormal gait, Decreased activity tolerance, Decreased balance, Decreased coordination, Decreased endurance, Decreased knowledge of use of DME, Decreased mobility, Decreased range of motion, Decreased safety awareness, Decreased strength, Difficulty walking, Impaired perceived functional ability, Impaired sensation, Impaired tone, Impaired UE functional use, Improper body  mechanics, Postural dysfunction  Visit Diagnosis: Muscle weakness (generalized)  Other abnormalities of gait and mobility  Unsteadiness on feet     Problem List Patient Active Problem List   Diagnosis Date Noted  . Abscess of female pelvis   . SVT (supraventricular tachycardia) ([place])   . Radial styloid tenosynovitis 03/12/2018  . Wheelchair confinement 02/27/2018  . Localized osteoporosis with current pathological fracture with routine healing 01/19/2017  . Wrist fracture 01/16/2017  . Sprain of ankle 03/23/2016  . Closed fracture of lateral malleolus 03/16/2016  . Health care maintenance 01/24/2016  . Blood pressure elevated without history of HTN 10/25/2015  . Essential hypertension 10/25/2015  . Multiple sclerosis ([place]) 10/02/2015  . Chronic left shoulder pain 07/19/2015  . Multiple sclerosis exacerbation ([place]) 07/14/2015  . MS (multiple sclerosis) ([place]) 11/26/2014  . Increased body mass index 11/26/2014  . HPV test positive 11/26/2014  . Status post laparoscopic supracervical hysterectomy 11/26/2014  . Galactorrhea 11/26/2014  . Back ache 05/21/2014  . Adiposity 05/21/2014  . Disordered sleep 05/21/2014  . Muscle spasticity 05/21/2014  . Spasticity 05/21/2014  . Calculus of kidney 12/09/2013  . Renal colic 76/72/0947  . Hypercholesteremia 08/19/2013  . Hereditary and idiopathic neuropathy 08/19/2013  . Hypercholesterolemia without  hypertriglyceridemia 08/19/2013  . Bladder infection, chronic 07/25/2012  . Disorder of bladder function 07/25/2012  . Incomplete bladder emptying 07/25/2012  . Microscopic hematuria 07/25/2012  . Right upper quadrant pain 07/25/2012   Janna Arch, PT, DPT   07/31/2019, 1:27 PM  [place] [facility] SERVICES [address] [place], [place], [address] Phone: [phone]   Fax:  [phone]  Name: Cortina Vultaggio Ekstrom MRN: [id number] Date of Birth: 06/22/1960

## 2019-07-31 NOTE — Addendum Note (Signed)
Addended by: Claudie Fisherman on: 07/31/2019 09:11 AM   Modules accepted: Orders

## 2019-08-03 ENCOUNTER — Other Ambulatory Visit: Payer: Self-pay

## 2019-08-03 ENCOUNTER — Encounter: Payer: Self-pay | Admitting: *Deleted

## 2019-08-03 ENCOUNTER — Inpatient Hospital Stay
Admission: EM | Admit: 2019-08-03 | Discharge: 2019-08-08 | DRG: 060 | Disposition: A | Discharge status: Rehabilitation Facility | Payer: PPO | Attending: Internal Medicine | Admitting: Internal Medicine

## 2019-08-03 DIAGNOSIS — Z8041 Family history of malignant neoplasm of ovary: Secondary | ICD-10-CM

## 2019-08-03 DIAGNOSIS — M8580 Other specified disorders of bone density and structure, unspecified site: Secondary | ICD-10-CM | POA: Diagnosis present

## 2019-08-03 DIAGNOSIS — R339 Retention of urine, unspecified: Secondary | ICD-10-CM | POA: Diagnosis not present

## 2019-08-03 DIAGNOSIS — G35 Multiple sclerosis: Principal | ICD-10-CM | POA: Diagnosis present

## 2019-08-03 DIAGNOSIS — N319 Neuromuscular dysfunction of bladder, unspecified: Secondary | ICD-10-CM | POA: Diagnosis present

## 2019-08-03 DIAGNOSIS — Z87891 Personal history of nicotine dependence: Secondary | ICD-10-CM

## 2019-08-03 DIAGNOSIS — R001 Bradycardia, unspecified: Secondary | ICD-10-CM | POA: Diagnosis present

## 2019-08-03 DIAGNOSIS — Z800 Family history of malignant neoplasm of digestive organs: Secondary | ICD-10-CM | POA: Diagnosis not present

## 2019-08-03 DIAGNOSIS — G609 Hereditary and idiopathic neuropathy, unspecified: Secondary | ICD-10-CM | POA: Diagnosis present

## 2019-08-03 DIAGNOSIS — Z832 Family history of diseases of the blood and blood-forming organs and certain disorders involving the immune mechanism: Secondary | ICD-10-CM

## 2019-08-03 DIAGNOSIS — Z803 Family history of malignant neoplasm of breast: Secondary | ICD-10-CM | POA: Diagnosis not present

## 2019-08-03 DIAGNOSIS — Z841 Family history of disorders of kidney and ureter: Secondary | ICD-10-CM

## 2019-08-03 DIAGNOSIS — Z833 Family history of diabetes mellitus: Secondary | ICD-10-CM

## 2019-08-03 DIAGNOSIS — Z83438 Family history of other disorder of lipoprotein metabolism and other lipidemia: Secondary | ICD-10-CM | POA: Diagnosis not present

## 2019-08-03 DIAGNOSIS — Z79899 Other long term (current) drug therapy: Secondary | ICD-10-CM | POA: Diagnosis not present

## 2019-08-03 DIAGNOSIS — Z87442 Personal history of urinary calculi: Secondary | ICD-10-CM

## 2019-08-03 DIAGNOSIS — Z8042 Family history of malignant neoplasm of prostate: Secondary | ICD-10-CM

## 2019-08-03 DIAGNOSIS — Z8249 Family history of ischemic heart disease and other diseases of the circulatory system: Secondary | ICD-10-CM

## 2019-08-03 DIAGNOSIS — I10 Essential (primary) hypertension: Secondary | ICD-10-CM | POA: Diagnosis present

## 2019-08-03 DIAGNOSIS — N393 Stress incontinence (female) (male): Secondary | ICD-10-CM | POA: Diagnosis present

## 2019-08-03 DIAGNOSIS — R29898 Other symptoms and signs involving the musculoskeletal system: Secondary | ICD-10-CM | POA: Diagnosis not present

## 2019-08-03 DIAGNOSIS — Z20822 Contact with and (suspected) exposure to covid-19: Secondary | ICD-10-CM | POA: Diagnosis present

## 2019-08-03 DIAGNOSIS — N3289 Other specified disorders of bladder: Secondary | ICD-10-CM | POA: Diagnosis not present

## 2019-08-03 LAB — COMPREHENSIVE METABOLIC PANEL
ALT: 24 U/L (ref 0–44)
AST: 22 U/L (ref 15–41)
Albumin: 4.1 g/dL (ref 3.5–5.0)
Alkaline Phosphatase: 124 U/L (ref 38–126)
Anion gap: 7 (ref 5–15)
BUN: 20 mg/dL (ref 6–20)
CO2: 24 mmol/L (ref 22–32)
Calcium: 9.2 mg/dL (ref 8.9–10.3)
Chloride: 110 mmol/L (ref 98–111)
Creatinine, Ser: 0.85 mg/dL (ref 0.44–1.00)
GFR calc Af Amer: >60 mL/min (ref >60)
GFR calc non Af Amer: >60 mL/min (ref >60)
Glucose, Bld: 111 mg/dL — ABNORMAL HIGH (ref 70–99)
Potassium: 4 mmol/L (ref 3.5–5.1)
Sodium: 141 mmol/L (ref 135–145)
Total Bilirubin: 0.6 mg/dL (ref 0.3–1.2)
Total Protein: 7.7 g/dL (ref 6.5–8.1)

## 2019-08-03 LAB — CBC
HCT: 41.6 % (ref 36.0–46.0)
Hemoglobin: 14.4 g/dL (ref 12.0–15.0)
MCH: 26.4 pg (ref 26.0–34.0)
MCHC: 34.6 g/dL (ref 30.0–36.0)
MCV: 76.2 fL — ABNORMAL LOW (ref 80.0–100.0)
Platelets: 254 10*3/uL (ref 150–400)
RBC: 5.46 MIL/uL — ABNORMAL HIGH (ref 3.87–5.11)
RDW: 14 % (ref 11.5–15.5)
WBC: 8.4 10*3/uL (ref 4.0–10.5)
nRBC: 0 % (ref 0.0–0.2)

## 2019-08-03 MED ORDER — ONDANSETRON 4 MG PO TBDP: 4.0000 mg | ORAL_TABLET | Freq: Three times a day (TID) | ORAL | Status: DC | PRN

## 2019-08-03 MED ORDER — VITAMIN D 25 MCG (1000 UNIT) PO TABS
2000.0000 [IU] | ORAL_TABLET | Freq: Every day | ORAL | Status: DC
  Administered 2019-08-04 – 2019-08-08 (×5): 2000 [IU] via ORAL
  Filled 2019-08-03 (×5): qty 2

## 2019-08-03 MED ORDER — OXYBUTYNIN CHLORIDE 5 MG PO TABS: 5.0000 mg | ORAL_TABLET | Freq: Two times a day (BID) | ORAL | Status: DC

## 2019-08-03 MED ORDER — ENOXAPARIN SODIUM 40 MG/0.4ML ~~LOC~~ SOLN
40.0000 mg | SUBCUTANEOUS | Status: DC
  Administered 2019-08-03 – 2019-08-07 (×5): 40 mg via SUBCUTANEOUS
  Filled 2019-08-03 (×5): qty 0.4

## 2019-08-03 MED ORDER — ACETAMINOPHEN 500 MG PO TABS
1000.0000 mg | ORAL_TABLET | Freq: Once | ORAL | Status: AC
  Administered 2019-08-03: 1000 mg via ORAL
  Filled 2019-08-03: qty 2

## 2019-08-03 MED ORDER — IRBESARTAN 150 MG PO TABS
75.0000 mg | ORAL_TABLET | Freq: Every day | ORAL | Status: DC
  Filled 2019-08-03 (×2): qty 1

## 2019-08-03 MED ORDER — SODIUM CHLORIDE 0.9% FLUSH: 3.0000 mL | Freq: Once | INTRAVENOUS | Status: DC

## 2019-08-03 MED ORDER — OXYBUTYNIN CHLORIDE ER 10 MG PO TB24
10.0000 mg | ORAL_TABLET | Freq: Every day | ORAL | Status: DC
  Administered 2019-08-04 – 2019-08-08 (×5): 10 mg via ORAL
  Filled 2019-08-03 (×6): qty 1

## 2019-08-03 MED ORDER — SODIUM CHLORIDE 0.9 % IV SOLN: INTRAVENOUS | Status: DC

## 2019-08-03 MED ORDER — GABAPENTIN 100 MG PO CAPS: 100.0000 mg | ORAL_CAPSULE | Freq: Every day | ORAL | Status: DC

## 2019-08-03 MED ORDER — ADULT MULTIVITAMIN W/MINERALS CH
ORAL_TABLET | Freq: Every day | ORAL | Status: DC
  Administered 2019-08-05 – 2019-08-08 (×4): 1 via ORAL
  Filled 2019-08-03 (×5): qty 1

## 2019-08-03 MED ORDER — ONDANSETRON HCL 4 MG PO TABS: 4.0000 mg | ORAL_TABLET | Freq: Four times a day (QID) | ORAL | Status: DC | PRN

## 2019-08-03 MED ORDER — TRAZODONE HCL 50 MG PO TABS: 25.0000 mg | ORAL_TABLET | Freq: Every evening | ORAL | Status: DC | PRN

## 2019-08-03 MED ORDER — ONDANSETRON HCL 4 MG/2ML IJ SOLN: 4.0000 mg | Freq: Four times a day (QID) | INTRAMUSCULAR | Status: DC | PRN

## 2019-08-03 MED ORDER — BACLOFEN 10 MG PO TABS
20.0000 mg | ORAL_TABLET | Freq: Three times a day (TID) | ORAL | Status: DC
  Administered 2019-08-04 – 2019-08-08 (×13): 20 mg via ORAL
  Filled 2019-08-03 (×17): qty 2

## 2019-08-03 MED ORDER — MAGNESIUM HYDROXIDE 400 MG/5ML PO SUSP: 30.0000 mL | Freq: Every day | ORAL | Status: DC | PRN

## 2019-08-03 NOTE — ED Notes (Signed)
Admitting provider at bedside.

## 2019-08-03 NOTE — ED Notes (Signed)
ED Provider at bedside. 

## 2019-08-03 NOTE — ED Provider Notes (Signed)
[facility] Emergency Department Provider Note  ____________________________________________   First MD Initiated Contact with Patient 08/03/19 2052     (approximate)  I have reviewed the triage vital signs and the nursing notes.  History  Chief Complaint Weakness    HPI Lisa Williams is a 59 y.o. female with a history of relapsing remitting MS, neurogenic bladder who presents to the ED with concern for MS flare.  Patient states she had her second COVID vaccine yesterday, Moderna.  She was doing well until this afternoon around 3 PM when she developed acute bilateral lower extremity weakness and decreased sensation.  Reports symptoms are present from the waist down.  She has been unable to walk since then.  Denies any upper extremity findings.  No difficulty breathing, speaking, swallowing.  Denies any progressive or ascending component, states symptoms all started acutely at the same time.  She states this is consistent with prior MS flares.   Past Medical Hx Past Medical History:  Diagnosis Date  . Abdominal pain, right upper quadrant   . Back pain   . Calculus of kidney 12/09/2013  . Chronic back pain    unspecified  . Chronic left shoulder pain 07/19/2015  . Functional disorder of bladder    other  . Galactorrhea 11/26/2014   Chronic   . Hereditary and idiopathic neuropathy 08/19/2013  . HPV test positive   . Hypercholesteremia 08/19/2013  . Incomplete bladder emptying   . Microscopic hematuria   . MS (multiple sclerosis) ([place])   . Muscle spasticity 05/21/2014  . Nonspecific findings on examination of urine    other  . Osteopenia   . Status post laparoscopic supracervical hysterectomy 11/26/2014  . Tobacco user 11/26/2014  . Wrist fracture     Problem List Patient Active Problem List   Diagnosis Date Noted  . Abscess of female pelvis   . SVT (supraventricular tachycardia) ([place])   . Radial styloid tenosynovitis 03/12/2018  .  Wheelchair confinement 02/27/2018  . Localized osteoporosis with current pathological fracture with routine healing 01/19/2017  . Wrist fracture 01/16/2017  . Sprain of ankle 03/23/2016  . Closed fracture of lateral malleolus 03/16/2016  . Health care maintenance 01/24/2016  . Blood pressure elevated without history of HTN 10/25/2015  . Essential hypertension 10/25/2015  . Multiple sclerosis ([place]) 10/02/2015  . Chronic left shoulder pain 07/19/2015  . Multiple sclerosis exacerbation ([place]) 07/14/2015  . MS (multiple sclerosis) ([place]) 11/26/2014  . Increased body mass index 11/26/2014  . HPV test positive 11/26/2014  . Status post laparoscopic supracervical hysterectomy 11/26/2014  . Galactorrhea 11/26/2014  . Back ache 05/21/2014  . Adiposity 05/21/2014  . Disordered sleep 05/21/2014  . Muscle spasticity 05/21/2014  . Spasticity 05/21/2014  . Calculus of kidney 12/09/2013  . Renal colic 53/61/4431  . Hypercholesteremia 08/19/2013  . Hereditary and idiopathic neuropathy 08/19/2013  . Hypercholesterolemia without hypertriglyceridemia 08/19/2013  . Bladder infection, chronic 07/25/2012  . Disorder of bladder function 07/25/2012  . Incomplete bladder emptying 07/25/2012  . Microscopic hematuria 07/25/2012  . Right upper quadrant pain 07/25/2012    Past Surgical Hx Past Surgical History:  Procedure Laterality Date  . bilateral tubal ligation  1996  . BREAST CYST EXCISION Left 2002  . KNEE SURGERY     right  . LAPAROSCOPIC SUPRACERVICAL HYSTERECTOMY  08/05/2013  . ORIF WRIST FRACTURE Left 01/17/2017   Procedure: OPEN REDUCTION INTERNAL FIXATION (ORIF) WRIST FRACTURE;  Surgeon: Lovell Sheehan, MD;  Location: [facility] ORS;  Service:  Orthopedics;  Laterality: Left;  . TUBAL LIGATION Bilateral   . VAGINAL HYSTERECTOMY  03/2006    Medications Prior to Admission medications   Medication Sig Start Date End Date Taking? Authorizing Provider  baclofen (LIORESAL) 20 MG tablet Take 20 mg by  mouth 3 (three) times daily.     [provider]  Cholecalciferol (EQL VITAMIN D3) 25 MCG (1000 UT) tablet Take 2,000 Units by mouth daily.    [provider]  gabapentin (NEURONTIN) 100 MG capsule  01/03/19   [provider]  MULTIPLE VITAMIN PO Take 1 tablet by mouth daily.    [provider]  ondansetron (ZOFRAN ODT) 4 MG disintegrating tablet Take 1 tablet (4 mg total) by mouth every 8 (eight) hours as needed. 04/21/19   Jene Every, MD  oxybutynin (DITROPAN) 5 MG tablet TAKE 1 TABLET BY MOUTH TWICE A DAY 05/19/19   Vanna Scotland, MD  oxybutynin (DITROPAN-XL) 10 MG 24 hr tablet Take 1 tablet (10 mg total) by mouth daily. 03/12/19   Vanna Scotland, MD  telmisartan (MICARDIS) 40 MG tablet Take 40 mg by mouth daily. 10/05/17   [provider]    Allergies Amlodipine and Povidone iodine  Family Hx Family History  Problem Relation Age of Onset  . Sickle cell trait Sister   . Hypertension Sister   . Supraventricular tachycardia Sister   . Hypertension Sister   . Diabetes Maternal Grandmother   . Liver cancer Maternal Grandmother   . Diabetes Maternal Aunt   . Breast cancer Maternal Aunt        great MAT  . Ovarian cancer Paternal Grandmother   . Nephrolithiasis Father   . Hypertension Father   . Prostate cancer Father   . Kidney disease Father   . Heart failure Father   . Hypertension Sister   . Osteoarthritis Mother   . Hypertension Mother   . Hyperlipidemia Mother   . GU problems Neg Hx   . Urolithiasis Neg Hx     Social Hx Social History   Tobacco Use  . Smoking status: Former Smoker    Packs/day: 0.50    Types: Cigarettes  . Smokeless tobacco: Never Used  Substance Use Topics  . Alcohol use: No    Alcohol/week: 0.0 standard drinks  . Drug use: No     Review of Systems  Constitutional: Negative for fever. Negative for chills. Eyes: Negative for visual changes. ENT: Negative for sore throat. Cardiovascular:  Negative for chest pain. Respiratory: Negative for shortness of breath. Gastrointestinal: Negative for nausea. Negative for vomiting.  Genitourinary: Negative for dysuria. Musculoskeletal: Negative for leg swelling. Skin: Negative for rash. Neurological: Negative for headaches.  Positive for bilateral lower extremity weakness.  Physical Exam  Vital Signs: ED Triage Vitals  Enc Vitals Group     BP 08/03/19 2044 (!) 157/87     Pulse Rate 08/03/19 2044 (!) 101     Resp 08/03/19 2044 18     Temp 08/03/19 2044 98.8 F (37.1 C)     Temp Source 08/03/19 2044 Oral     SpO2 08/03/19 2044 97 %     Weight 08/03/19 2046 280 lb (127 kg)     Height 08/03/19 2046 5\' 11"  (1.803 m)     Head Circumference --      Peak Flow --      Pain Score 08/03/19 2046 0     Pain Loc --      Pain Edu? --  Excl. in GC? --     Constitutional: Alert and oriented. Well appearing. NAD.  Head: Normocephalic. Atraumatic. Eyes: Conjunctivae clear. Sclera anicteric. Pupils equal and symmetric. Nose: No masses or lesions. No congestion or rhinorrhea. Mouth/Throat: Wearing mask.  Neck: No stridor. Trachea midline.  Cardiovascular: Normal rate, regular rhythm. Extremities well perfused. Respiratory: Normal respiratory effort.  Lungs CTAB. Gastrointestinal: Soft. Non-distended. Non-tender.  Genitourinary: Deferred. Musculoskeletal: No lower extremity edema. No deformities. Neurologic:  Normal speech and language.  Cranial nerves II through XII intact.  Face symmetric.  No droop.  Normal speech.  No dysarthria or dysphagia.  BUE strength 5/5 and symmetric.  BUE sensation intact to light touch.  BLE strength 1/5 and symmetric.  BLE sensation present, but decreased compared to normal, improves slightly at the level of the distal thigh. Skin: Skin is warm, dry and intact. No rash noted. Psychiatric: Mood and affect are appropriate for situation.  EKG  N/A    Radiology   N/A   Procedures  Procedure(s)  performed (including critical care):  Procedures   Initial Impression / Assessment and Plan / MDM / ED Course  59 y.o. female who presents to the ED for bilateral lower extremity weakness, concern for MS flare after receiving her second COVID vaccine  Ddx: MS flare, electrolyte abnormality, anemia, Guillain Barre (though this seems less likely given no ascending component)  Will plan for basic labs, discussion with Neurology.  Discussed obtaining MRI imaging with the patient, but at this time she is adamantly against obtaining MRI due to claustrophobia.  States the last time she had an MRI she had to heavily medicated prior to last imaging which was several years ago.   Clinical Course as of Aug 04 146  Wynelle Link Aug 03, 2019  2138 Discussed case with on-call [facility] neurology Dr. Amada Jupiter who agrees that presentation seems most consistent with recrudescence of prior MS symptoms in the setting of her vaccination and expected inflammatory response.  MRI would be most ideal to evaluate for acute lesions, however patient is declining this at this time.  She will require hospitalization regardless due to inability to walk.  Dr. Amada Jupiter recommends admission for this regardless as well, and have neurology evaluate in the AM regarding appropriateness for empiric steroids versus possibility of imaging with medication at that time.  Will discuss with hospitalist for admission. Patient is agreeable.  [SM]    Clinical Course User Index [SM] Miguel Aschoff., MD     _______________________________   As part of my medical decision making I have reviewed available labs, radiology tests, reviewed old records,  and discussed with consultants (Neurology).   Final Clinical Impression(s) / ED Diagnosis  Final diagnoses:  Weakness of both lower extremities       Note:  This document was prepared using Dragon voice recognition software and may include unintentional dictation errors.   Miguel Aschoff.,  MD 08/04/19 [phone]

## 2019-08-03 NOTE — H&P (Signed)
[place] at [facility]   PATIENT NAME: Lisa Williams    MR#:  [id number]  DATE OF BIRTH:  [birth year]  DATE OF ADMISSION:  08/03/2019  PRIMARY CARE PHYSICIAN: Lauro Regulus, MD   REQUESTING/REFERRING PHYSICIAN: Paschal Dopp, MD CHIEF COMPLAINT:   Chief Complaint  Patient presents with  . Weakness    HISTORY OF PRESENT ILLNESS:  Lisa Williams  is a 59 y.o. African-American female with a known history of multiple sclerosis, who presented to the emergency room with the onset of bilateral lower extremity weakness with decreased sensations that started today at 3 PM.  She had her second Covid vaccine yesterday around 12 noon.  She denies any fever or chills.  No nausea or vomiting or abdominal pain or diarrhea.  No headache or dizziness or blurred vision.  No arm soreness or redness or induration or swelling.  No rashes or worsening skin redness or induration.  She stated that this is exactly like her last MS flare that occurred in December 2019.  Upon presentation to the emergency room, blood pressure was 157/87 with a pulse of 101 respiratory rate 18 and temperature 98.8 with pulse currently 97% on room air.  Labs revealed unremarkable CMP and CBC.  The patient was given 1 g of p.o. Tylenol.  Contact was made with Dr. Amada Jupiter with neurology on-call at [facility].  The recommendation was for holding off on steroids and having the patient be evaluated by her primary neurologist first.  She will be admitted to medical monitored bed for further evaluation and management. PAST MEDICAL HISTORY:   Past Medical History:  Diagnosis Date  . Abdominal pain, right upper quadrant   . Back pain   . Calculus of kidney 12/09/2013  . Chronic back pain    unspecified  . Chronic left shoulder pain 07/19/2015  . Functional disorder of bladder    other  . Galactorrhea 11/26/2014   Chronic   . Hereditary and idiopathic neuropathy 08/19/2013  . HPV test positive     . Hypercholesteremia 08/19/2013  . Incomplete bladder emptying   . Microscopic hematuria   . MS (multiple sclerosis) (HCC)   . Muscle spasticity 05/21/2014  . Nonspecific findings on examination of urine    other  . Osteopenia   . Status post laparoscopic supracervical hysterectomy 11/26/2014  . Tobacco user 11/26/2014  . Wrist fracture   Hypertension  PAST SURGICAL HISTORY:   Past Surgical History:  Procedure Laterality Date  . bilateral tubal ligation  1996  . BREAST CYST EXCISION Left 2002  . KNEE SURGERY     right  . LAPAROSCOPIC SUPRACERVICAL HYSTERECTOMY  08/05/2013  . ORIF WRIST FRACTURE Left 01/17/2017   Procedure: OPEN REDUCTION INTERNAL FIXATION (ORIF) WRIST FRACTURE;  Surgeon: Lyndle Herrlich, MD;  Location: [facility] ORS;  Service: Orthopedics;  Laterality: Left;  . TUBAL LIGATION Bilateral   . VAGINAL HYSTERECTOMY  03/2006    SOCIAL HISTORY:   Social History   Tobacco Use  . Smoking status: Former Smoker    Packs/day: 0.50    Types: Cigarettes  . Smokeless tobacco: Never Used  Substance Use Topics  . Alcohol use: No    Alcohol/week: 0.0 standard drinks    FAMILY HISTORY:   Family History  Problem Relation Age of Onset  . Sickle cell trait Sister   . Hypertension Sister   . Supraventricular tachycardia Sister   . Hypertension Sister   . Diabetes Maternal Grandmother   . Liver  cancer Maternal Grandmother   . Diabetes Maternal Aunt   . Breast cancer Maternal Aunt        great MAT  . Ovarian cancer Paternal Grandmother   . Nephrolithiasis Father   . Hypertension Father   . Prostate cancer Father   . Kidney disease Father   . Heart failure Father   . Hypertension Sister   . Osteoarthritis Mother   . Hypertension Mother   . Hyperlipidemia Mother   . GU problems Neg Hx   . Urolithiasis Neg Hx     DRUG ALLERGIES:   Allergies  Allergen Reactions  . Amlodipine Other (See Comments)    edema  . Povidone Iodine Rash    REVIEW OF SYSTEMS:    ROS As per history of present illness. All pertinent systems were reviewed above. Constitutional,  HEENT, cardiovascular, respiratory, GI, GU, musculoskeletal, neuro, psychiatric, endocrine,  integumentary and hematologic systems were reviewed and are otherwise  negative/unremarkable except for positive findings mentioned above in the HPI.   MEDICATIONS AT HOME:   Prior to Admission medications   Medication Sig Start Date End Date Taking? Authorizing Provider  baclofen (LIORESAL) 20 MG tablet Take 20 mg by mouth 3 (three) times daily.     [provider]  Cholecalciferol (EQL VITAMIN D3) 25 MCG (1000 UT) tablet Take 2,000 Units by mouth daily.    [provider]  gabapentin (NEURONTIN) 100 MG capsule  01/03/19   [provider]  MULTIPLE VITAMIN PO Take 1 tablet by mouth daily.    [provider]  ondansetron (ZOFRAN ODT) 4 MG disintegrating tablet Take 1 tablet (4 mg total) by mouth every 8 (eight) hours as needed. 04/21/19   Jene Every, MD  oxybutynin (DITROPAN) 5 MG tablet TAKE 1 TABLET BY MOUTH TWICE A DAY 05/19/19   Vanna Scotland, MD  oxybutynin (DITROPAN-XL) 10 MG 24 hr tablet Take 1 tablet (10 mg total) by mouth daily. 03/12/19   Vanna Scotland, MD  telmisartan (MICARDIS) 40 MG tablet Take 40 mg by mouth daily. 10/05/17   [provider]      VITAL SIGNS:  Blood pressure (!) 157/87, pulse (!) 101, temperature 98.8 F (37.1 C), temperature source Oral, resp. rate 18, height 5\' 11"  (1.803 m), weight 127 kg, SpO2 97 %.  PHYSICAL EXAMINATION:  Physical Exam  GENERAL:  59 y.o.-year-old African-American female patient lying in the bed with no acute distress.  EYES: Pupils equal, round, reactive to light and accommodation. No scleral icterus. Extraocular muscles intact.  HEENT: Head atraumatic, normocephalic. Oropharynx and nasopharynx clear.  NECK:  Supple, no jugular venous distention. No thyroid enlargement, no tenderness.   LUNGS: Normal breath sounds bilaterally, no wheezing, rales,rhonchi or crepitation. No use of accessory muscles of respiration.  CARDIOVASCULAR: Regular rate and rhythm, S1, S2 normal. No murmurs, rubs, or gallops.  ABDOMEN: Soft, nondistended, nontender. Bowel sounds present. No organomegaly or mass.  EXTREMITIES: No pedal edema, cyanosis, or clubbing.  She has ankle-foot orthotics for drop feet. NEUROLOGIC: Cranial nerves II through XII are intact. Muscle strength 3/5 in the left lower extremity and 4/5 in the right lower extremity and 5/5 in both upper extremities.  Sensation to light touch was slightly diminished in both lower extremities.. Gait not checked.  PSYCHIATRIC: The patient is alert and oriented x 3.  Normal affect and good eye contact. SKIN: No obvious rash, lesion, or ulcer.   LABORATORY PANEL:   CBC Recent Labs  Lab 08/03/19 2046  WBC 8.4  HGB 14.4  HCT 41.6  PLT 254   ------------------------------------------------------------------------------------------------------------------  Chemistries  Recent Labs  Lab 08/03/19 2046  NA 141  K 4.0  CL 110  CO2 24  GLUCOSE 111*  BUN 20  CREATININE 0.85  CALCIUM 9.2  AST 22  ALT 24  ALKPHOS 124  BILITOT 0.6   ------------------------------------------------------------------------------------------------------------------  Cardiac Enzymes No results for input(s): TROPONINI in the last 168 hours. ------------------------------------------------------------------------------------------------------------------  RADIOLOGY:  No results found.    IMPRESSION AND PLAN:  1.  Multiple sclerosis exacerbation/flare manifested with bilateral lower extremity weakness and decreased light touch sensation. -The patient will be admitted to medical monitored bed. -Neurochecks will be performed every 4 hours for 24 hours. -Neurology consultation will be obtained by Dr. Doy Mince in a.m.  I notified her about the  patient. -We will hold off on IV steroids at this time and continue monitoring per neurologist on-call recommendation. -Physical therapy consult will be obtained. -We will continue her baclofen.  2.  Hypertension. -We will continue her ARB therapy.  3.  Incomplete bladder emptying. -We will continue Ditropan XL.  4.  DVT prophylaxis. -Subcutaneous Lovenox.  All the records are reviewed and case discussed with ED provider. The plan of care was discussed in details with the patient (and family). I answered all questions. The patient agreed to proceed with the above mentioned plan. Further management will depend upon hospital course.   CODE STATUS: Full code  TOTAL TIME TAKING CARE OF THIS PATIENT: 55 minutes.    Christel Mormon M.D on 08/03/2019 at 10:03 PM  [facility]   From 7 PM-7 AM, contact night-coverage [email or web address]  CC: Primary care physician; Kirk Ruths, MD   Note: This dictation was prepared with Dragon dictation along with smaller phrase technology. Any transcriptional errors that result from this process are unintentional.

## 2019-08-03 NOTE — ED Triage Notes (Signed)
Pt reports decreased mobility. Pt reports new onset of leg weakness starting at 1500 today. Pt states Mederma shot yesterday and believes that is the cause of her leg weakness. Pt denies pain. Pt has PMH of MS

## 2019-08-04 DIAGNOSIS — G35 Multiple sclerosis: Principal | ICD-10-CM

## 2019-08-04 LAB — POCT PREGNANCY, URINE: Preg Test, Ur: NEGATIVE

## 2019-08-04 LAB — URINALYSIS, COMPLETE (UACMP) WITH MICROSCOPIC
Bacteria, UA: NONE SEEN
Bilirubin Urine: NEGATIVE
Glucose, UA: NEGATIVE mg/dL
Hgb urine dipstick: NEGATIVE
Ketones, ur: 20 mg/dL — AB
Leukocytes,Ua: NEGATIVE
Nitrite: NEGATIVE
Protein, ur: NEGATIVE mg/dL
Specific Gravity, Urine: 1.034 — ABNORMAL HIGH (ref 1.005–1.030)
pH: 5 (ref 5.0–8.0)

## 2019-08-04 LAB — BASIC METABOLIC PANEL
Anion gap: 5 (ref 5–15)
BUN: 27 mg/dL — ABNORMAL HIGH (ref 6–20)
CO2: 22 mmol/L (ref 22–32)
Calcium: 8.5 mg/dL — ABNORMAL LOW (ref 8.9–10.3)
Chloride: 112 mmol/L — ABNORMAL HIGH (ref 98–111)
Creatinine, Ser: 0.69 mg/dL (ref 0.44–1.00)
GFR calc Af Amer: >60 mL/min (ref >60)
GFR calc non Af Amer: >60 mL/min (ref >60)
Glucose, Bld: 95 mg/dL (ref 70–99)
Potassium: 3.9 mmol/L (ref 3.5–5.1)
Sodium: 139 mmol/L (ref 135–145)

## 2019-08-04 LAB — CBC
HCT: 34.9 % — ABNORMAL LOW (ref 36.0–46.0)
Hemoglobin: 12.3 g/dL (ref 12.0–15.0)
MCH: 26.6 pg (ref 26.0–34.0)
MCHC: 35.2 g/dL (ref 30.0–36.0)
MCV: 75.5 fL — ABNORMAL LOW (ref 80.0–100.0)
Platelets: 213 10*3/uL (ref 150–400)
RBC: 4.62 MIL/uL (ref 3.87–5.11)
RDW: 14 % (ref 11.5–15.5)
WBC: 6.7 10*3/uL (ref 4.0–10.5)
nRBC: 0 % (ref 0.0–0.2)

## 2019-08-04 LAB — SARS CORONAVIRUS 2 (TAT 6-24 HRS): SARS Coronavirus 2: NEGATIVE

## 2019-08-04 LAB — HIV ANTIBODY (ROUTINE TESTING W REFLEX): HIV Screen 4th Generation wRfx: NONREACTIVE

## 2019-08-04 MED ORDER — SODIUM CHLORIDE 0.9 % IV SOLN
1000.0000 mg | Freq: Every day | INTRAVENOUS | Status: AC
  Administered 2019-08-04 – 2019-08-06 (×3): 1000 mg via INTRAVENOUS
  Filled 2019-08-04 (×3): qty 8

## 2019-08-04 MED ORDER — GABAPENTIN 100 MG PO CAPS
100.0000 mg | ORAL_CAPSULE | Freq: Two times a day (BID) | ORAL | Status: DC
  Administered 2019-08-04 – 2019-08-08 (×9): 100 mg via ORAL
  Filled 2019-08-04 (×9): qty 1

## 2019-08-04 MED ORDER — GABAPENTIN 100 MG PO CAPS: 100.0000 mg | ORAL_CAPSULE | Freq: Two times a day (BID) | ORAL | Status: DC

## 2019-08-04 MED ORDER — IRBESARTAN 150 MG PO TABS
150.0000 mg | ORAL_TABLET | Freq: Every day | ORAL | Status: DC
  Administered 2019-08-04 – 2019-08-08 (×5): 150 mg via ORAL
  Filled 2019-08-04 (×4): qty 1

## 2019-08-04 NOTE — Consult Note (Signed)
Reason for Consult:LE weakness Requesting Physician: Mayford Knife  CC: LE weakness  I have been asked by Dr. Mayford Knife to see this patient in consultation for MS exacerbation.  HPI: Lisa Williams Nurse is an 59 y.o. female with a history of MS who is well known to this facility.  Patient reports getting her COVID vaccination at the end of last week.  On Sunday awakened at baseline.  At 3pm noted that she has difficulty standing and ambulating.  Was also urinating frequently.  No incontinence. No sensory complaints.  This is what her past exacerbations have been like.     Past Medical History:  Diagnosis Date  . Abdominal pain, right upper quadrant   . Back pain   . Calculus of kidney 12/09/2013  . Chronic back pain    unspecified  . Chronic left shoulder pain 07/19/2015  . Functional disorder of bladder    other  . Galactorrhea 11/26/2014   Chronic   . Hereditary and idiopathic neuropathy 08/19/2013  . HPV test positive   . Hypercholesteremia 08/19/2013  . Incomplete bladder emptying   . Microscopic hematuria   . MS (multiple sclerosis) (HCC)   . Muscle spasticity 05/21/2014  . Nonspecific findings on examination of urine    other  . Osteopenia   . Status post laparoscopic supracervical hysterectomy 11/26/2014  . Tobacco user 11/26/2014  . Wrist fracture     Past Surgical History:  Procedure Laterality Date  . bilateral tubal ligation  1996  . BREAST CYST EXCISION Left 2002  . KNEE SURGERY     right  . LAPAROSCOPIC SUPRACERVICAL HYSTERECTOMY  08/05/2013  . ORIF WRIST FRACTURE Left 01/17/2017   Procedure: OPEN REDUCTION INTERNAL FIXATION (ORIF) WRIST FRACTURE;  Surgeon: Lyndle Herrlich, MD;  Location: [facility] ORS;  Service: Orthopedics;  Laterality: Left;  . TUBAL LIGATION Bilateral   . VAGINAL HYSTERECTOMY  03/2006    Family History  Problem Relation Age of Onset  . Sickle cell trait Sister   . Hypertension Sister   . Supraventricular tachycardia Sister   . Hypertension Sister    . Diabetes Maternal Grandmother   . Liver cancer Maternal Grandmother   . Diabetes Maternal Aunt   . Breast cancer Maternal Aunt        great MAT  . Ovarian cancer Paternal Grandmother   . Nephrolithiasis Father   . Hypertension Father   . Prostate cancer Father   . Kidney disease Father   . Heart failure Father   . Hypertension Sister   . Osteoarthritis Mother   . Hypertension Mother   . Hyperlipidemia Mother   . GU problems Neg Hx   . Urolithiasis Neg Hx     Social History:  reports that she has quit smoking. Her smoking use included cigarettes. She smoked 0.50 packs per day. She has never used smokeless tobacco. She reports that she does not drink alcohol or use drugs.  Allergies  Allergen Reactions  . Amlodipine Other (See Comments)    edema  . Povidone Iodine Rash    Medications:  I have reviewed the patient's current medications. Prior to Admission:  Medications Prior to Admission  Medication Sig Dispense Refill Last Dose  . baclofen (LIORESAL) 20 MG tablet Take 20 mg by mouth 3 (three) times daily.    08/03/2019 at 1500  . Cholecalciferol (EQL VITAMIN D3) 25 MCG (1000 UT) tablet Take 2,000 Units by mouth daily.   08/03/2019 at 0900  . gabapentin (NEURONTIN) 100 MG capsule Take 100  mg by mouth 2 (two) times daily.    08/03/2019 at 0900  . ibandronate (BONIVA) 150 MG tablet TAKE 1 TABLET BY MOUTH EVERY 30 DAYS FIRST THING IN THE MORNING WITH A FULL GLASS OF WATER. DO NOT EAT, DRINK OR LIE DOWN FOR 60 MINUTES AFTER   07/16/2019 at Unknown  . oxybutynin (DITROPAN-XL) 10 MG 24 hr tablet Take 1 tablet (10 mg total) by mouth daily. 30 tablet 11 08/03/2019 at 0900  . telmisartan (MICARDIS) 40 MG tablet Take 40 mg by mouth daily.   08/03/2019 at 0900  . oxybutynin (DITROPAN) 5 MG tablet TAKE 1 TABLET BY MOUTH TWICE A DAY 180 tablet 1    Scheduled: . baclofen  20 mg Oral TID  . cholecalciferol  2,000 Units Oral Daily  . enoxaparin (LOVENOX) injection  40 mg Subcutaneous Q24H  .  gabapentin  100 mg Oral BID  . irbesartan  75 mg Oral Daily  . multivitamin with minerals   Oral Daily  . oxybutynin  10 mg Oral Daily  . sodium chloride flush  3 mL Intravenous Once    ROS: History obtained from the patient  General ROS: negative for - chills, fatigue, fever, night sweats, weight gain or weight loss Psychological ROS: negative for - behavioral disorder, hallucinations, memory difficulties, mood swings or suicidal ideation Ophthalmic ROS: negative for - blurry vision, double vision, eye pain or loss of vision ENT ROS: negative for - epistaxis, nasal discharge, oral lesions, sore throat, tinnitus or vertigo Allergy and Immunology ROS: negative for - hives or itchy/watery eyes Hematological and Lymphatic ROS: negative for - bleeding problems, bruising or swollen lymph nodes Endocrine ROS: negative for - galactorrhea, hair pattern changes, polydipsia/polyuria or temperature intolerance Respiratory ROS: negative for - cough, hemoptysis, shortness of breath or wheezing Cardiovascular ROS: negative for - chest pain, dyspnea on exertion, edema or irregular heartbeat Gastrointestinal ROS: negative for - abdominal pain, diarrhea, hematemesis, nausea/vomiting or stool incontinence Genito-Urinary ROS: negative for - dysuria, hematuria, incontinence or urinary frequency/urgency Musculoskeletal ROS: negative for - joint swelling or muscular weakness Neurological ROS: as noted in HPI Dermatological ROS: negative for rash and skin lesion changes  Physical Examination: Blood pressure 138/70, pulse (!) 54, temperature 98.3 F (36.8 C), temperature source Oral, resp. rate 19, height 5\' 11"  (1.803 m), weight 122.5 kg, SpO2 99 %.  HEENT-  Normocephalic, no lesions, without obvious abnormality.  Normal external eye and conjunctiva.  Normal TM's bilaterally.  Normal auditory canals and external ears. Normal external nose, mucus membranes and septum.  Normal pharynx. Cardiovascular- S1, S2  normal, pulses palpable throughout   Lungs- chest clear, no wheezing, rales, normal symmetric air entry Abdomen- soft, non-tender; bowel sounds normal; no masses,  no organomegaly Extremities- mild LE edema Lymph-no adenopathy palpable Musculoskeletal-no joint tenderness, deformity or swelling Skin-warm and dry, no hyperpigmentation, vitiligo, or suspicious lesions  Neurological Examination   Mental Status: Alert, oriented, thought content appropriate.  Speech fluent without evidence of aphasia.  Able to follow 3 step commands without difficulty. Cranial Nerves: II: Visual fields grossly normal, pupils equal, round, reactive to light and accommodation III,IV, VI: ptosis not present, extra-ocular motions intact bilaterally V,VII: smile symmetric, facial light touch sensation normal bilaterally VIII: hearing normal bilaterally IX,X: gag reflex present XI: bilateral shoulder shrug XII: midline tongue extension Motor: Right : Upper extremity   5/5    Left:     Upper extremity   5/5 Unable to lift either lower extremity off the bed Tone and  bulk:normal tone throughout; no atrophy noted Sensory: Pinprick and light touch intact throughout, bilaterally Deep Tendon Reflexes: Symmetric throughout Plantars: Right: mute   Left: mute Cerebellar: Normal finger-to-nose testing bilaterally Gait: not tested due to safety concerns    Laboratory Studies:   Basic Metabolic Panel: Recent Labs  Lab 08/03/19 2046 08/04/19 0414  NA 141 139  K 4.0 3.9  CL 110 112*  CO2 24 22  GLUCOSE 111* 95  BUN 20 27*  CREATININE 0.85 0.69  CALCIUM 9.2 8.5*    Liver Function Tests: Recent Labs  Lab 08/03/19 2046  AST 22  ALT 24  ALKPHOS 124  BILITOT 0.6  PROT 7.7  ALBUMIN 4.1   No results for input(s): LIPASE, AMYLASE in the last 168 hours. No results for input(s): AMMONIA in the last 168 hours.  CBC: Recent Labs  Lab 08/03/19 2046 08/04/19 0414  WBC 8.4 6.7  HGB 14.4 12.3  HCT 41.6  34.9*  MCV 76.2* 75.5*  PLT 254 213    Cardiac Enzymes: No results for input(s): CKTOTAL, CKMB, CKMBINDEX, TROPONINI in the last 168 hours.  BNP: Invalid input(s): POCBNP  CBG: No results for input(s): GLUCAP in the last 168 hours.  Microbiology: Results for orders placed or performed during the hospital encounter of 08/03/19  SARS CORONAVIRUS 2 (TAT 6-24 HRS) Nasopharyngeal Nasopharyngeal Swab     Status: None   Collection Time: 08/03/19 10:50 PM   Specimen: Nasopharyngeal Swab  Result Value Ref Range Status   SARS Coronavirus 2 NEGATIVE NEGATIVE Final    Comment: (NOTE) SARS-CoV-2 target nucleic acids are NOT DETECTED. The SARS-CoV-2 RNA is generally detectable in upper and lower respiratory specimens during the acute phase of infection. Negative results do not preclude SARS-CoV-2 infection, do not rule out co-infections with other pathogens, and should not be used as the sole basis for treatment or other patient management decisions. Negative results must be combined with clinical observations, patient history, and epidemiological information. The expected result is Negative. Fact Sheet for Patients: HairSlick.no Fact Sheet for Healthcare Providers: quierodirigir.com This test is not yet approved or cleared by the [place] FDA and  has been authorized for detection and/or diagnosis of SARS-CoV-2 by FDA under an Emergency Use Authorization (EUA). This EUA will remain  in effect (meaning this test can be used) for the duration of the COVID-19 declaration under Section 56 4(b)(1) of the Act, 21 U.S.C. section 360bbb-3(b)(1), unless the authorization is terminated or revoked sooner. Performed at [facility], [address] [place], [place] [address]     Coagulation Studies: No results for input(s): LABPROT, INR in the last 72 hours.  Urinalysis:  Recent Labs  Lab 08/03/19 2250  COLORURINE YELLOW*   LABSPEC 1.034*  PHURINE 5.0  GLUCOSEU NEGATIVE  HGBUR NEGATIVE  BILIRUBINUR NEGATIVE  KETONESUR 20*  PROTEINUR NEGATIVE  NITRITE NEGATIVE  LEUKOCYTESUR NEGATIVE    Lipid Panel:     Component Value Date/Time   CHOL 187 11/29/2016 0000   TRIG 64 11/29/2016 0000   HDL 55 11/29/2016 0000   CHOLHDL 3.4 11/29/2016 0000   LDLCALC 119 (H) 11/29/2016 0000    HgbA1C:  Lab Results  Component Value Date   HGBA1C 5.1 11/29/2016    Urine Drug Screen:  No results found for: LABOPIA, COCAINSCRNUR, LABBENZ, AMPHETMU, THCU, LABBARB  Alcohol Level: No results for input(s): ETH in the last 168 hours.  Other results: EKG: normal sinus rhythm at 86 bpm.  Imaging: No results found.  Assessment/Plan: 59 year old female with a history of MS presenting with BLE weakness.  MS exacerbation suspected.    Recommendations: 1. Solumedrol 1000mg  daily for 3 days.  Will re-evaluate at that time for need of further steroid treatment. 2. GI prophylaxis 3. PT/OT consults  , MD Neurology [phone] 08/04/2019, 10:06 AM

## 2019-08-04 NOTE — Progress Notes (Signed)
PROGRESS NOTE    Lisa Williams  PRF:[id number] DOB: 14-Jul-1960 DOA: 08/03/2019 PCP: Kirk Ruths, MD       Assessment & Plan:   Active Problems:   Multiple sclerosis exacerbation (HCC)   Multiple sclerosis exacerbation: relapsing/remitting. W/ bilateral lower extremity weakness and decreased light touch sensation. Will continue on home dose of baclofen. PT consulted. Solumedrol 1000 mg daily x 3 days as per neuro. Neuro following and recs apprec   Hypertension: continue irbesartan   Incomplete bladder emptying: will continue Ditropan XL.   DVT prophylaxis: lovenox Code Status: full  Family Communication:  Disposition Plan: depends on PT recs   Consultants:  Neuro   Procedures:    Antimicrobials:   Subjective: Pt c/o b/l LE weakness  Objective: Vitals:   08/04/19 0330 08/04/19 0400 08/04/19 0430 08/04/19 0500  BP:      Pulse: 66 68 65 64  Resp: 13 (!) 21 17 12   Temp:      TempSrc:      SpO2: 95% 96% 95% 95%  Weight:      Height:       No intake or output data in the 24 hours ending 08/04/19 0814 Filed Weights   08/03/19 2046  Weight: 127 kg    Examination:  General exam: Appears calm and comfortable  Respiratory system: Clear to auscultation. Respiratory effort normal. No rales, rhonchi Cardiovascular system: S1 & S2 +. No  rubs, gallops or clicks.  Gastrointestinal system: Abdomen is nondistended, soft and nontender. Normal bowel sounds heard. Central nervous system: Alert and oriented. B/L LE weakness. Psychiatry: Judgement and insight appear normal. Mood & affect appropriate.     Data Reviewed: I have personally reviewed following labs and imaging studies  CBC: Recent Labs  Lab 08/03/19 2046 08/04/19 0414  WBC 8.4 6.7  HGB 14.4 12.3  HCT 41.6 34.9*  MCV 76.2* 75.5*  PLT 254 935   Basic Metabolic Panel: Recent Labs  Lab 08/03/19 2046 08/04/19 0414  NA 141 139  K 4.0 3.9  CL 110 112*  CO2 24 22  GLUCOSE  111* 95  BUN 20 27*  CREATININE 0.85 0.69  CALCIUM 9.2 8.5*   GFR: Estimated Creatinine Clearance: 114.3 mL/min (by C-G formula based on SCr of 0.69 mg/dL). Liver Function Tests: Recent Labs  Lab 08/03/19 2046  AST 22  ALT 24  ALKPHOS 124  BILITOT 0.6  PROT 7.7  ALBUMIN 4.1   No results for input(s): LIPASE, AMYLASE in the last 168 hours. No results for input(s): AMMONIA in the last 168 hours. Coagulation Profile: No results for input(s): INR, PROTIME in the last 168 hours. Cardiac Enzymes: No results for input(s): CKTOTAL, CKMB, CKMBINDEX, TROPONINI in the last 168 hours. BNP (last 3 results) No results for input(s): PROBNP in the last 8760 hours. HbA1C: No results for input(s): HGBA1C in the last 72 hours. CBG: No results for input(s): GLUCAP in the last 168 hours. Lipid Profile: No results for input(s): CHOL, HDL, LDLCALC, TRIG, CHOLHDL, LDLDIRECT in the last 72 hours. Thyroid Function Tests: No results for input(s): TSH, T4TOTAL, FREET4, T3FREE, THYROIDAB in the last 72 hours. Anemia Panel: No results for input(s): VITAMINB12, FOLATE, FERRITIN, TIBC, IRON, RETICCTPCT in the last 72 hours. Sepsis Labs: No results for input(s): PROCALCITON, LATICACIDVEN in the last 168 hours.  Recent Results (from the past 240 hour(s))  SARS CORONAVIRUS 2 (TAT 6-24 HRS) Nasopharyngeal Nasopharyngeal Swab     Status: None   Collection Time: 08/03/19 10:50 PM  Specimen: Nasopharyngeal Swab  Result Value Ref Range Status   SARS Coronavirus 2 NEGATIVE NEGATIVE Final    Comment: (NOTE) SARS-CoV-2 target nucleic acids are NOT DETECTED. The SARS-CoV-2 RNA is generally detectable in upper and lower respiratory specimens during the acute phase of infection. Negative results do not preclude SARS-CoV-2 infection, do not rule out co-infections with other pathogens, and should not be used as the sole basis for treatment or other patient management decisions. Negative results must be combined  with clinical observations, patient history, and epidemiological information. The expected result is Negative. Fact Sheet for Patients: HairSlick.no Fact Sheet for Healthcare Providers: quierodirigir.com This test is not yet approved or cleared by the [place] FDA and  has been authorized for detection and/or diagnosis of SARS-CoV-2 by FDA under an Emergency Use Authorization (EUA). This EUA will remain  in effect (meaning this test can be used) for the duration of the COVID-19 declaration under Section 56 4(b)(1) of the Act, 21 U.S.C. section 360bbb-3(b)(1), unless the authorization is terminated or revoked sooner. Performed at [facility], [address] [place], [place] [address]          Radiology Studies: No results found.      Scheduled Meds: . baclofen  20 mg Oral TID  . cholecalciferol  2,000 Units Oral Daily  . enoxaparin (LOVENOX) injection  40 mg Subcutaneous Q24H  . gabapentin  100 mg Oral QHS  . irbesartan  75 mg Oral Daily  . multivitamin with minerals   Oral Daily  . oxybutynin  10 mg Oral Daily  . sodium chloride flush  3 mL Intravenous Once   Continuous Infusions: . sodium chloride 100 mL/hr at 08/03/19 2305     LOS: 1 day    Time spent: 30 mins    Charise Killian, MD [facility] Pager [phone]  If 7PM-7AM, please contact night-coverage [email or web address] 08/04/2019, 8:14 AM

## 2019-08-04 NOTE — ED Notes (Signed)
Patient resting quietly with eyes closed at this time.

## 2019-08-04 NOTE — Evaluation (Signed)
Physical Therapy Evaluation Patient Details Name: Lisa Williams MRN: [id number] DOB: [birth year] Today's Date: 08/04/2019   History of Present Illness  Patient is a pleasant 59 year old withPMH includes back pain, MS, chronic left shoulder pain, galactorrhea, hereditary and idiopathic neuropathy, HPV, hypercholesteremia, osteopenia, SVT, wrist fx, and HTN. Patient recieved COVID 19 vaccine (second dose) on saturday, on sunday c/o of weakness, inability to get out of chair and incontinance. Brought to the ED and admitted for MS exacerbation.  Clinical Impression  Patient is a pleasant 59 year old female who presents with sudden onset weakness and limited mobility. Patient prior to admission has been seeing this write in outpatient physical therapy twice a week. Noticeable deterioration in LE and UE strength noted with patient declining from walking with RW and AFO's in hallway less than a week ago to not being able to transfer STS with assistance and raised bed. Patient is eager to participate with physical therapy evaluation and is able to roll L and R without assistance for bedding change. Supine to sit transition required min A for RLE onto/off of bed. Once in sitting patient is able to tolerate seated EOB without assistance for multiple minutes with increasing challenge of task. Patient was unable to transition sitting to standing with UE assistance, use of walker, and PT, buckling of knees and inability to press up due to fatigue. Patient would benefit from skilled physical therapy to increase strength, mobility, and transfers. Placement/aid required pending patient performance with PT in next 3-4 physical therapy sessions, if patient is able to tolerate stand pivot transfer safely she is safe for Home with HHPT and aide.     Follow Up Recommendations Other (comment)(Home Health pending patient's ability to stand pivot transfer in next 3-4 physical therapy session.)    Equipment  Recommendations  3in1 (PT)    Recommendations for Other Services OT consult     Precautions / Restrictions Precautions Precautions: Fall;Other (comment) Precaution Comments: MS Restrictions Weight Bearing Restrictions: No      Mobility  Bed Mobility Overal bed mobility: Needs Assistance Bed Mobility: Supine to Sit;Sit to Supine;Rolling Rolling: Independent   Supine to sit: Min assist Sit to supine: Min assist   General bed mobility comments: Patient able to transition supine to sit and sit to supine with min A for RLE onto/off bed.She is ind. with rolling and scooting within bed with use of arm rails  Transfers                 General transfer comment: unable to tolerate STS this session from raised matt table. Transfered previously with slide board to/from wheelchair per patient report.  Ambulation/Gait             General Gait Details: unable to attempt this session  Stairs            Wheelchair Mobility    Modified Rankin (Stroke Patients Only)       Balance Overall balance assessment: Needs assistance Sitting-balance support: Single extremity supported;Feet supported Sitting balance-Leahy Scale: Fair Sitting balance - Comments: able to mantain EOB without back support with occasional SUE support touches to recenter self. Postural control: Posterior lean     Standing balance comment: unable to tolerate standing this session                             Pertinent Vitals/Pain Pain Assessment: No/denies pain    Home Living Family/patient expects to  be discharged to:: Private residence Living Arrangements: Other relatives;Parent Available Help at Discharge: Family;Available 24 hours/day Type of Home: House Home Access: Ramped entrance     Home Layout: Two level;Able to live on main level with bedroom/bathroom Home Equipment: Walker - 2 wheels;Grab bars - tub/shower;Grab bars - toilet;Wheelchair - manual;Shower seat - built  in;Hand held shower head      Prior Function Level of Independence: Independent with assistive device(s)         Comments: ambulates short distance with outpatient PT, uses manual chair at home with RW for mobility, drives with hand controls, dresses ind.     Hand Dominance   Dominant Hand: Right    Extremity/Trunk Assessment   Upper Extremity Assessment Upper Extremity Assessment: Defer to OT evaluation    Lower Extremity Assessment Lower Extremity Assessment: Generalized weakness(tested EOB: hips 2/5 (hip flexion 2-/5  knees 3/5; sensation diminished bilaterally)    Cervical / Trunk Assessment Cervical / Trunk Assessment: Normal  Communication   Communication: No difficulties  Cognition Arousal/Alertness: Awake/alert Behavior During Therapy: WFL for tasks assessed/performed Overall Cognitive Status: Within Functional Limits for tasks assessed                                 General Comments: Patient has slight tremors of head and hands, A and O x 4      General Comments General comments (skin integrity, edema, etc.): increased edema bilaterally, noticeable indentation from AFOs    Exercises Other Exercises Other Exercises: Patient educated and performed safe bed mobility. supine ankle pumps 15x, rolling R and L for bedding change   Assessment/Plan    PT Assessment Patient needs continued PT services  PT Problem List Decreased strength;Decreased activity tolerance;Decreased balance;Decreased knowledge of use of DME;Decreased mobility;Impaired sensation       PT Treatment Interventions DME instruction;Gait training;Functional mobility training;Neuromuscular re-education;Balance training;Therapeutic exercise;Therapeutic activities;Patient/family education    PT Goals (Current goals can be found in the Care Plan section)  Acute Rehab PT Goals Patient Stated Goal: to get strength back and go home PT Goal Formulation: With patient Time For Goal  Achievement: 08/18/19 Potential to Achieve Goals: Fair    Frequency Min 2X/week   Barriers to discharge   would benefit from trial period of 3-4 PT sessions for placement and aide determination.    Co-evaluation               AM-PAC PT "6 Clicks" Mobility  Outcome Measure Help needed turning from your back to your side while in a flat bed without using bedrails?: A Little Help needed moving from lying on your back to sitting on the side of a flat bed without using bedrails?: A Lot Help needed moving to and from a bed to a chair (including a wheelchair)?: A Lot Help needed standing up from a chair using your arms (e.g., wheelchair or bedside chair)?: A Lot Help needed to walk in hospital room?: A Lot Help needed climbing 3-5 steps with a railing? : Total 6 Click Score: 12    End of Session Equipment Utilized During Treatment: Gait belt Activity Tolerance: Patient tolerated treatment well;Other (comment);Patient limited by fatigue Patient left: in bed;with call bell/phone within reach Nurse Communication: Mobility status PT Visit Diagnosis: Unsteadiness on feet (R26.81);Other abnormalities of gait and mobility (R26.89);Muscle weakness (generalized) (M62.81);Difficulty in walking, not elsewhere classified (R26.2);Other symptoms and signs involving the nervous system (R29.898)    Time:  6825-7493 PT Time Calculation (min) (ACUTE ONLY): 39 min   Charges:   PT Evaluation $PT Eval Moderate Complexity: 1 Mod PT Treatments $Therapeutic Activity: 8-22 mins       Precious Bard, PT, DPT    Precious Bard 08/04/2019, 3:00 PM

## 2019-08-05 ENCOUNTER — Ambulatory Visit: Payer: PPO

## 2019-08-05 MED ORDER — ACETAMINOPHEN 500 MG PO TABS
1000.0000 mg | ORAL_TABLET | Freq: Four times a day (QID) | ORAL | Status: DC | PRN
  Administered 2019-08-05: 1000 mg via ORAL
  Filled 2019-08-05 (×2): qty 2

## 2019-08-05 NOTE — Progress Notes (Signed)
Subjective: Patient able to move her legs better today.  No side effects noted from Solumedrol.    Objective: Current vital signs: BP 133/61 (BP Location: Right Arm)   Pulse 99   Temp 97.8 F (36.6 C) (Oral)   Resp 19   Ht 5\' 11"  (1.803 m)   Wt 123.1 kg   SpO2 98%   BMI 37.85 kg/m  Vital signs in last 24 hours: Temp:  [97.8 F (36.6 C)-98.3 F (36.8 C)] 97.8 F (36.6 C) (03/30 1136) Pulse Rate:  [59-99] 99 (03/30 1336) Resp:  [19] 19 (03/29 1540) BP: (121-135)/(61-67) 133/61 (03/30 1136) SpO2:  [98 %-100 %] 98 % (03/30 1136) Weight:  [123.1 kg] 123.1 kg (03/30 0410)  Intake/Output from previous day: 03/29 0701 - 03/30 0700 In: 2435.3 [P.O.:940; I.V.:1495.3] Out: 1400 [Urine:1400] Intake/Output this shift: Total I/O In: 240 [P.O.:240] Out: 950 [Urine:950] Nutritional status:  Diet Order            Diet Heart Room service appropriate? Yes; Fluid consistency: Thin  Diet effective now              Neurologic Exam: Mental Status: Alert, oriented, thought content appropriate.  Speech fluent without evidence of aphasia.  Able to follow 3 step commands without difficulty. Cranial Nerves: II: Discs flat bilaterally; Visual fields grossly normal, pupils equal, round, reactive to light and accommodation III,IV, VI: ptosis not present, extra-ocular motions intact bilaterally V,VII: smile symmetric, facial light touch sensation normal bilaterally VIII: hearing normal bilaterally IX,X: gag reflex present XI: bilateral shoulder shrug XII: midline tongue extension Motor: 5/5 in the BUE's.  Able to lift each lower extremity a few inches off the bed.   Sensory: Pinprick and light touch intact throughout, bilaterally   Lab Results: Basic Metabolic Panel: Recent Labs  Lab 08/03/19 2046 08/04/19 0414  NA 141 139  K 4.0 3.9  CL 110 112*  CO2 24 22  GLUCOSE 111* 95  BUN 20 27*  CREATININE 0.85 0.69  CALCIUM 9.2 8.5*    Liver Function Tests: Recent Labs  Lab  08/03/19 2046  AST 22  ALT 24  ALKPHOS 124  BILITOT 0.6  PROT 7.7  ALBUMIN 4.1   No results for input(s): LIPASE, AMYLASE in the last 168 hours. No results for input(s): AMMONIA in the last 168 hours.  CBC: Recent Labs  Lab 08/03/19 2046 08/04/19 0414  WBC 8.4 6.7  HGB 14.4 12.3  HCT 41.6 34.9*  MCV 76.2* 75.5*  PLT 254 213    Cardiac Enzymes: No results for input(s): CKTOTAL, CKMB, CKMBINDEX, TROPONINI in the last 168 hours.  Lipid Panel: No results for input(s): CHOL, TRIG, HDL, CHOLHDL, VLDL, LDLCALC in the last 168 hours.  CBG: No results for input(s): GLUCAP in the last 168 hours.  Microbiology: Results for orders placed or performed during the hospital encounter of 08/03/19  SARS CORONAVIRUS 2 (TAT 6-24 HRS) Nasopharyngeal Nasopharyngeal Swab     Status: None   Collection Time: 08/03/19 10:50 PM   Specimen: Nasopharyngeal Swab  Result Value Ref Range Status   SARS Coronavirus 2 NEGATIVE NEGATIVE Final    Comment: (NOTE) SARS-CoV-2 target nucleic acids are NOT DETECTED. The SARS-CoV-2 RNA is generally detectable in upper and lower respiratory specimens during the acute phase of infection. Negative results do not preclude SARS-CoV-2 infection, do not rule out co-infections with other pathogens, and should not be used as the sole basis for treatment or other patient management decisions. Negative results must be combined with  clinical observations, patient history, and epidemiological information. The expected result is Negative. Fact Sheet for Patients: HairSlick.no Fact Sheet for Healthcare Providers: quierodirigir.com This test is not yet approved or cleared by the [place] FDA and  has been authorized for detection and/or diagnosis of SARS-CoV-2 by FDA under an Emergency Use Authorization (EUA). This EUA will remain  in effect (meaning this test can be used) for the duration of the COVID-19  declaration under Section 56 4(b)(1) of the Act, 21 U.S.C. section 360bbb-3(b)(1), unless the authorization is terminated or revoked sooner. Performed at [facility], [address] [place], [place] [address]     Coagulation Studies: No results for input(s): LABPROT, INR in the last 72 hours.  Imaging: No results found.  Medications:  I have reviewed the patient's current medications. Scheduled: . baclofen  20 mg Oral TID  . cholecalciferol  2,000 Units Oral Daily  . enoxaparin (LOVENOX) injection  40 mg Subcutaneous Q24H  . gabapentin  100 mg Oral BID  . irbesartan  150 mg Oral Daily  . multivitamin with minerals   Oral Daily  . oxybutynin  10 mg Oral Daily  . sodium chloride flush  3 mL Intravenous Once    Assessment/Plan: 59 year old female with a history of MS presenting with BLE weakness.  MS exacerbation suspected.  Today is day #2 of IV solumedrol.  Improved strength noted in the LE's.    Recommendations: 1. Solumedrol 1000mg  daily for 3 days.  Will re-evaluate at that time for need of further steroid treatment. 2. GI prophylaxis 3. PT/OT consults    LOS: 2 days   , MD Neurology [phone] 08/05/2019  1:45 PM

## 2019-08-05 NOTE — Evaluation (Addendum)
Occupational Therapy Evaluation Patient Details Name: Lisa Williams MRN: [id number] DOB: Sep 19, 1960 Today's Date: 08/05/2019    History of Present Illness Patient is a pleasant 59 year old withPMH includes back pain, MS, chronic left shoulder pain, galactorrhea, hereditary and idiopathic neuropathy, HPV, hypercholesteremia, osteopenia, SVT, wrist fx, and HTN. Patient recieved COVID 19 vaccine (second dose) on saturday, on sunday c/o of weakness, inability to get out of chair and incontinance. Brought to the ED and admitted for MS exacerbation.   Clinical Impression   Lisa Williams was seen for OT evaluation this date. Prior to hospital admission, pt was active and modified independnet in all BADL management. She endorses driving using hand controls, using a 2WW for functional mobility, and seeing her outpatient physical therapist regularly. She states she is eager to get back to shopping and going out to eat, both hobbies she enjoys, but has had to limit significantly due to the pandemic. Pt lives with her mother and sister in a 2 level home with a ramped entrance. Currently pt demonstrates impairments as described below (See OT problem list) which functionally limit her ability to perform ADL/self-care tasks. Pt currently requires moderate to maximal assistance to perform LB ADL management as well as set up/supervision assist for UB ADL tasks due to decreased fxl mobility. Pt attempts fxl transfer to room recliner this date, but is unable to progress mobility beyond weight shifting in standing at EOB. She is motivated to go farther but recognizes safety risk for pushing beyond her functional abilities and returns to sitting EOB with poor eccentric control noted. Pt requires +2 max assist to return to supine. Pt would benefit from skilled OT to address noted impairments and functional limitations (see below for any additional details) in order to maximize safety and independence while minimizing  falls risk and caregiver burden. Upon hospital discharge, recommend CIR for acute intensive rehabilitative services s/p acute DC.    Follow Up Recommendations  CIR   Equipment Recommendations  Other (comment)(Bari tub transfer bench)    Recommendations for Other Services       Precautions / Restrictions Precautions Precautions: Fall;Other (comment) Precaution Comments: MS Restrictions Weight Bearing Restrictions: No      Mobility Bed Mobility Overal bed mobility: Needs Assistance Bed Mobility: Supine to Sit;Sit to Supine Rolling: Independent   Supine to sit: Supervision;HOB elevated Sit to supine: +2 for physical assistance;Max assist   General bed mobility comments: Pt comes to sitting EOB with increased time/effort to perform but no physical assist from therapist this date. She requires +2 max assist to return to supine 2/2 fatigue after mobility attempts.  Transfers Overall transfer level: Needs assistance   Transfers: Sit to/from Stand;Lateral/Scoot Transfers Sit to Stand: Mod assist;From elevated surface        Lateral/Scoot Transfers: Supervision;Min guard      Balance Overall balance assessment: Needs assistance Sitting-balance support: Single extremity supported;Feet supported;No upper extremity supported Sitting balance-Leahy Scale: Fair Sitting balance - Comments: Steady static and dynamic sitting w/o LOB noted. Occasional use of 1 UE support  when performing dynamic sitting balance tasks.   Standing balance support: Bilateral upper extremity supported Standing balance-Leahy Scale: Fair Standing balance comment: Heavy BUE use on RW when standing. Able to weight shift with BUE support/mod A for balance.                           ADL either performed or assessed with clinical judgement   ADL Overall  ADL's : Needs assistance/impaired                                       General ADL Comments: Pt reports she is independent for  all BADL management at baseline. She uses a Manual WC for fxl mobility on "bad days" and using a 2WW for mobility on good days. She uses a hospital bed, long handle reacher, and hand held showerhead as additional support for baseline function. Pt currently requires moderate to max assist for LB ADL management including dressing and bathing. She requires max assist to don/dof hospital socks at bed level this date. She demonstrates limited ability to perform SLR to assist.     Vision Baseline Vision/History: Wears glasses Wears Glasses: At all times Patient Visual Report: No change from baseline       Perception     Praxis      Pertinent Vitals/Pain Pain Assessment: No/denies pain     Hand Dominance Right   Extremity/Trunk Assessment Upper Extremity Assessment Upper Extremity Assessment: Generalized weakness(BUE grossly 4-/5. Grip/FMC WFLs.)   Lower Extremity Assessment Lower Extremity Assessment: Generalized weakness;Defer to PT evaluation   Cervical / Trunk Assessment Cervical / Trunk Assessment: Normal   Communication Communication Communication: No difficulties   Cognition Arousal/Alertness: Awake/alert Behavior During Therapy: WFL for tasks assessed/performed Overall Cognitive Status: Within Functional Limits for tasks assessed                                 General Comments: Pt  A and O x 4, pleasant, conversational. Endorses feeling much better from time of initial admit.   General Comments       Exercises Other Exercises Other Exercises: Pt educated in falls prevention strategies, safe use of AE/DME for ADL mgt with time to discuss commercially available options for Bari TTBs, and routines modficiations to support safety and functional independence upon hospital DC. Other Exercises: OT engages pt in ADL/fxl mobility as described above. See ADL section for additional details.   Shoulder Instructions      Home Living Family/patient expects to be  discharged to:: Private residence Living Arrangements: Other relatives;Parent Available Help at Discharge: Family;Available 24 hours/day Type of Home: House Home Access: Ramped entrance     Home Layout: Two level;Able to live on main level with bedroom/bathroom     Bathroom Shower/Tub: Teacher, early years/pre: Standard Bathroom Accessibility: Yes How Accessible: Accessible via walker Home Equipment: Walker - 2 wheels;Grab bars - tub/shower;Grab bars - toilet;Wheelchair - manual;Shower seat - built in;Hand held shower head   Additional Comments: Pt denies falls hx in past year      Prior Functioning/Environment Level of Independence: Independent with assistive device(s)        Comments: ambulates short distance with outpatient PT, uses manual chair at home with RW for mobility, drives with hand controls, dresses ind.        OT Problem List: Decreased strength;Decreased coordination;Decreased range of motion;Decreased activity tolerance;Decreased safety awareness;Impaired balance (sitting and/or standing);Decreased knowledge of use of DME or AE;Pain      OT Treatment/Interventions: Self-care/ADL training;Therapeutic exercise;Therapeutic activities;DME and/or AE instruction;Patient/family education;Balance training    OT Goals(Current goals can be found in the care plan section) Acute Rehab OT Goals Patient Stated Goal: to get strength back and go home OT Goal Formulation: With patient  Time For Goal Achievement: 08/19/19 Potential to Achieve Goals: Good  OT Frequency: Min 2X/week   Barriers to D/C:            Co-evaluation              AM-PAC OT "6 Clicks" Daily Activity     Outcome Measure Help from another person eating meals?: None Help from another person taking care of personal grooming?: A Little Help from another person toileting, which includes using toliet, bedpan, or urinal?: A Lot Help from another person bathing (including washing,  rinsing, drying)?: A Lot Help from another person to put on and taking off regular upper body clothing?: None Help from another person to put on and taking off regular lower body clothing?: A Lot 6 Click Score: 17   End of Session Equipment Utilized During Treatment: Gait belt;Rolling walker Nurse Communication: Other (comment)(Primary RN notified pt tele monitor came off during transfer attempts.)  Activity Tolerance: Patient tolerated treatment well Patient left: in bed;with call bell/phone within reach  OT Visit Diagnosis: Other abnormalities of gait and mobility (R26.89);Muscle weakness (generalized) (M62.81);Other symptoms and signs involving the nervous system (Q33.354)                Time: 5625-6389 OT Time Calculation (min): 53 min Charges:  OT General Charges $OT Visit: 1 Visit OT Evaluation $OT Eval Moderate Complexity: 1 Mod OT Treatments $Self Care/Home Management : 38-52 mins  Rockney Ghee, M.S., OTR/L Ascom: [phone] 08/05/19, 10:59 AM

## 2019-08-05 NOTE — Progress Notes (Signed)
PROGRESS NOTE    Lisa Williams  GEX:[id number] DOB: 04-30-1961 DOA: 08/03/2019 PCP: Kirk Ruths, MD       Assessment & Plan:   Active Problems:   Multiple sclerosis exacerbation (HCC)   Multiple sclerosis exacerbation: relapsing/remitting. W/ bilateral lower extremity weakness and decreased light touch sensation which has improved today. Will continue on home dose of baclofen. PT/OT recs home health. Solumedrol 1000 mg daily x 3 days as per neuro. Neuro following and recs apprec   Hypertension: continue irbesartan   Incomplete bladder emptying: will continue Ditropan XL.   DVT prophylaxis: lovenox Code Status: full  Family Communication:  Disposition Plan: will likely d/c home w/ home health in another 24-48 hours   Consultants:  Neuro   Procedures:    Antimicrobials:   Subjective: Pt c/o b/l LE weakness but improved from day prior  Objective: Vitals:   08/04/19 1540 08/04/19 1946 08/05/19 0410 08/05/19 0756  BP: 128/63 135/67 124/62 121/67  Pulse: 65 69 62 60  Resp: 19     Temp: 98 F (36.7 C) 98.1 F (36.7 C) 97.8 F (36.6 C) 98.3 F (36.8 C)  TempSrc: Oral Oral Oral Oral  SpO2: 98% 100% 100% 99%  Weight:   123.1 kg   Height:        Intake/Output Summary (Last 24 hours) at 08/05/2019 0814 Last data filed at 08/05/2019 0413 Gross per 24 hour  Intake 2435.3 ml  Output 1400 ml  Net 1035.3 ml   Filed Weights   08/03/19 2046 08/04/19 0916 08/05/19 0410  Weight: 127 kg 122.5 kg 123.1 kg    Examination:  General exam: Appears calm and comfortable  Respiratory system: Clear to auscultation. Respiratory effort normal. No wheezes Cardiovascular system: S1 & S2 +. No  rubs, gallops or clicks.  Gastrointestinal system: Abdomen is nondistended, soft and nontender. Normal bowel sounds heard. Central nervous system: Alert and oriented. B/L LE weakness. Psychiatry: Judgement and insight appear normal. Mood & affect appropriate.      Data Reviewed: I have personally reviewed following labs and imaging studies  CBC: Recent Labs  Lab 08/03/19 2046 08/04/19 0414  WBC 8.4 6.7  HGB 14.4 12.3  HCT 41.6 34.9*  MCV 76.2* 75.5*  PLT 254 010   Basic Metabolic Panel: Recent Labs  Lab 08/03/19 2046 08/04/19 0414  NA 141 139  K 4.0 3.9  CL 110 112*  CO2 24 22  GLUCOSE 111* 95  BUN 20 27*  CREATININE 0.85 0.69  CALCIUM 9.2 8.5*   GFR: Estimated Creatinine Clearance: 112.3 mL/min (by C-G formula based on SCr of 0.69 mg/dL). Liver Function Tests: Recent Labs  Lab 08/03/19 2046  AST 22  ALT 24  ALKPHOS 124  BILITOT 0.6  PROT 7.7  ALBUMIN 4.1   No results for input(s): LIPASE, AMYLASE in the last 168 hours. No results for input(s): AMMONIA in the last 168 hours. Coagulation Profile: No results for input(s): INR, PROTIME in the last 168 hours. Cardiac Enzymes: No results for input(s): CKTOTAL, CKMB, CKMBINDEX, TROPONINI in the last 168 hours. BNP (last 3 results) No results for input(s): PROBNP in the last 8760 hours. HbA1C: No results for input(s): HGBA1C in the last 72 hours. CBG: No results for input(s): GLUCAP in the last 168 hours. Lipid Profile: No results for input(s): CHOL, HDL, LDLCALC, TRIG, CHOLHDL, LDLDIRECT in the last 72 hours. Thyroid Function Tests: No results for input(s): TSH, T4TOTAL, FREET4, T3FREE, THYROIDAB in the last 72 hours. Anemia Panel: No  results for input(s): VITAMINB12, FOLATE, FERRITIN, TIBC, IRON, RETICCTPCT in the last 72 hours. Sepsis Labs: No results for input(s): PROCALCITON, LATICACIDVEN in the last 168 hours.  Recent Results (from the past 240 hour(s))  SARS CORONAVIRUS 2 (TAT 6-24 HRS) Nasopharyngeal Nasopharyngeal Swab     Status: None   Collection Time: 08/03/19 10:50 PM   Specimen: Nasopharyngeal Swab  Result Value Ref Range Status   SARS Coronavirus 2 NEGATIVE NEGATIVE Final    Comment: (NOTE) SARS-CoV-2 target nucleic acids are NOT  DETECTED. The SARS-CoV-2 RNA is generally detectable in upper and lower respiratory specimens during the acute phase of infection. Negative results do not preclude SARS-CoV-2 infection, do not rule out co-infections with other pathogens, and should not be used as the sole basis for treatment or other patient management decisions. Negative results must be combined with clinical observations, patient history, and epidemiological information. The expected result is Negative. Fact Sheet for Patients: HairSlick.no Fact Sheet for Healthcare Providers: quierodirigir.com This test is not yet approved or cleared by the [place] FDA and  has been authorized for detection and/or diagnosis of SARS-CoV-2 by FDA under an Emergency Use Authorization (EUA). This EUA will remain  in effect (meaning this test can be used) for the duration of the COVID-19 declaration under Section 56 4(b)(1) of the Act, 21 U.S.C. section 360bbb-3(b)(1), unless the authorization is terminated or revoked sooner. Performed at [facility], [address], [place], [place] [address]          Radiology Studies: No results found.      Scheduled Meds: . baclofen  20 mg Oral TID  . cholecalciferol  2,000 Units Oral Daily  . enoxaparin (LOVENOX) injection  40 mg Subcutaneous Q24H  . gabapentin  100 mg Oral BID  . irbesartan  150 mg Oral Daily  . multivitamin with minerals   Oral Daily  . oxybutynin  10 mg Oral Daily  . sodium chloride flush  3 mL Intravenous Once   Continuous Infusions: . sodium chloride 100 mL/hr at 08/05/19 0644  . methylPREDNISolone (SOLU-MEDROL) injection 1,000 mg (08/04/19 1239)     LOS: 2 days    Time spent: 30 mins    Charise Killian, MD [facility] Pager [phone]  If 7PM-7AM, please contact night-coverage [email or web address] 08/05/2019, 8:14 AM

## 2019-08-05 NOTE — Progress Notes (Signed)
Physical Therapy Treatment Patient Details Name: Lisa Williams MRN: [id number] DOB: [birth year] Today's Date: 08/05/2019    History of Present Illness Patient is a pleasant 59 year old [place] includes back pain, MS, chronic left shoulder pain, galactorrhea, hereditary and idiopathic neuropathy, HPV, hypercholesteremia, osteopenia, SVT, wrist fx, and HTN. Patient recieved COVID 19 vaccine (second dose) on saturday, on sunday c/o of weakness, inability to get out of chair and incontinance. Brought to the ED and admitted for MS exacerbation.    PT Comments    Pt was long sitting in bed upon arriving with supportive sister and mother in room. Pt agrees to PT session and is motivated and pleasant throughout. She reports not pain and that she feels better day by day. She has resting HR of 88 bpm that elevated to 140s with standing activity. She was able to progress from long sitting to short sit EOB with CGA however required Mod assist of one to return to supine after performing standing activity. Pt fatigues quickly and required extended rest between all activities. Therapist assisted pt with placement of BLE AFO/shoes. She stood EOB 3 x with +2 Min assist for safety for protecting knees from buckling. Unsafe to progress away from EOB 2/2 to elevated HR and BLEs weakness. Pt gives maximal effort throughout session and is very motivated to get better. Lengthy discussion between pt/pt's family members/ and evaluating therapist to discuss DC disposition. Pt does not feel she can safely return home at this time. Pt's sister works during day and pt's mother is elderly and unable to perform assistance required. This author recommends DC to CIR with evaluating therapist's approval. Pt is very motivated and agreeable to go to CIR if accepted. She will benifit from CIR to address safe functional mobility deficits allowing her to return to PLOF.   Follow Up Recommendations  CIR     Equipment  Recommendations  3in1 (PT)    Recommendations for Other Services       Precautions / Restrictions Precautions Precautions: Fall Precaution Comments: MS Required Braces or Orthoses: Other Brace(BLEs AFOs) Restrictions Weight Bearing Restrictions: No    Mobility  Bed Mobility Overal bed mobility: Needs Assistance Bed Mobility: Supine to Sit;Sit to Supine Rolling: Min guard   Supine to sit: HOB elevated;Min guard Sit to supine: Mod assist;+2 for physical assistance;+2 for safety/equipment   General bed mobility comments: Pt was able to progress from supine (HOB slightly elevated) to short sit EOB with increased time and CGA for safety. pt relys on bed rails and bed controls to perform. She required Mod assist + 2nd person for safety to reposition back to supine after OOB activity.  Transfers Overall transfer level: Needs assistance Equipment used: Rolling walker (2 wheeled) Transfers: Sit to/from Stand Sit to Stand: +2 physical assistance;+2 safety/equipment;Min assist;From elevated surface         General transfer comment: Pt was able to stand 3 x EOB prior to fatigue and requesting to discontinue. Pt's resting HR was 88bpm that elevated to 140s with standing. Therapist assisted pt with placement of BLE AFO/shoes. VCs during standing for relaxation and overall technique improvements. +2 required to block knees to prevent buckling with standing. Pt was able to march in place with poor foor clearance. Due to elevated HR with minimal activity, therapist did not feel safe to progress to ambulation.  Ambulation/Gait             General Gait Details: unsafe to trial at this time.   Stairs  Wheelchair Mobility    Modified Rankin (Stroke Patients Only)       Balance                                            Cognition Arousal/Alertness: Awake/alert Behavior During Therapy: WFL for tasks assessed/performed Overall Cognitive Status:  Within Functional Limits for tasks assessed                                 General Comments: Pt  A and O x 4, pleasant, conversational. Endorses feeling much better from time of initial admit.      Exercises      General Comments        Pertinent Vitals/Pain Pain Assessment: No/denies pain    Home Living                      Prior Function            PT Goals (current goals can now be found in the care plan section) Acute Rehab PT Goals Patient Stated Goal: " To get back to moving like I was before all this started" Progress towards PT goals: Progressing toward goals    Frequency    Min 2X/week      PT Plan Discharge plan needs to be updated    Co-evaluation              AM-PAC PT "6 Clicks" Mobility   Outcome Measure  Help needed turning from your back to your side while in a flat bed without using bedrails?: A Little Help needed moving from lying on your back to sitting on the side of a flat bed without using bedrails?: A Lot Help needed moving to and from a bed to a chair (including a wheelchair)?: A Lot Help needed standing up from a chair using your arms (e.g., wheelchair or bedside chair)?: A Lot Help needed to walk in hospital room?: A Lot Help needed climbing 3-5 steps with a railing? : Total 6 Click Score: 12    End of Session Equipment Utilized During Treatment: Gait belt Activity Tolerance: Patient tolerated treatment well;Patient limited by fatigue Patient left: in bed;with call bell/phone within reach Nurse Communication: Mobility status PT Visit Diagnosis: Unsteadiness on feet (R26.81);Other abnormalities of gait and mobility (R26.89);Muscle weakness (generalized) (M62.81);Difficulty in walking, not elsewhere classified (R26.2);Other symptoms and signs involving the nervous system (R29.898)     Time: 8299-3716 PT Time Calculation (min) (ACUTE ONLY): 38 min  Charges:  $Therapeutic Activity: 38-52 mins                      Jetta Lout PTA 08/05/19, 3:30 PM

## 2019-08-05 NOTE — Progress Notes (Signed)
Rehab Admissions Coordinator Note:  Per PT/OT recommendation, patient was screened by Stephania Fragmin for appropriateness for an Inpatient Acute Rehab Consult.  At this time, we are recommending Inpatient Rehab consult. I will place a consult order per our protocol.    Stephania Fragmin 08/05/2019, 3:44 PM  I can be reached at [phone].

## 2019-08-05 NOTE — Plan of Care (Signed)
  Problem: Education: Goal: Knowledge of General Education information will improve Description: Including pain rating scale, medication(s)/side effects and non-pharmacologic comfort measures Outcome: Progressing   Problem: Health Behavior/Discharge Planning: Goal: Ability to manage health-related needs will improve Outcome: Progressing   Problem: Clinical Measurements: Goal: Ability to maintain clinical measurements within normal limits will improve Outcome: Not Progressing Note: BUN is elevated at 27. Will continue to monitor renal labs for the remainder of the shift. Jari Favre [facility]

## 2019-08-05 NOTE — Plan of Care (Signed)
  Problem: Activity: Goal: Risk for activity intolerance will decrease Outcome: Progressing   Problem: Coping: Goal: Level of anxiety will decrease Outcome: Progressing   

## 2019-08-06 MED ORDER — SODIUM CHLORIDE 0.9 % IV SOLN
1000.0000 mg | Freq: Every day | INTRAVENOUS | Status: AC
  Administered 2019-08-07 – 2019-08-08 (×2): 1000 mg via INTRAVENOUS
  Filled 2019-08-06 (×3): qty 8

## 2019-08-06 NOTE — Progress Notes (Signed)
PROGRESS NOTE    Lisa Williams  RJJ:[id number] DOB: May 22, 1960 DOA: 08/03/2019 PCP: Lauro Regulus, MD       Assessment & Plan:   Active Problems:   Multiple sclerosis exacerbation (HCC)   Multiple sclerosis exacerbation: relapsing/remitting. W/ bilateral lower extremity weakness and decreased light touch sensation which has improved today. Will continue on home dose of baclofen, gabapentin. PT is now recs CIR. Solumedrol 1000 mg daily x 2 days more to equal 5 days total as per neuro. Neuro following and recs apprec   Hypertension: continue irbesartan   Incomplete bladder emptying: will continue oxybutynin    DVT prophylaxis: lovenox Code Status: full  Family Communication:  Disposition Plan: PT is now recs CIR, and pt is agreeable to go to CIR    Consultants:  Neuro   Procedures:    Antimicrobials:   Subjective: Pt c/o b/l LE weakness but pt was able walk a bit today w/ PT.  Objective: Vitals:   08/05/19 1612 08/05/19 1943 08/06/19 0446 08/06/19 0812  BP: (!) 141/70 (!) 145/87 119/71 (!) 111/57  Pulse: 86 92 63 (!) 51  Resp:  15 18 18   Temp: 97.9 F (36.6 C) 98.4 F (36.9 C) 98.2 F (36.8 C) 98.2 F (36.8 C)  TempSrc: Oral Oral Oral Oral  SpO2: 97% 93% 100% 97%  Weight:   123.1 kg   Height:        Intake/Output Summary (Last 24 hours) at 08/06/2019 0821 Last data filed at 08/06/2019 0457 Gross per 24 hour  Intake 480 ml  Output 2350 ml  Net -1870 ml   Filed Weights   08/04/19 0916 08/05/19 0410 08/06/19 0446  Weight: 122.5 kg 123.1 kg 123.1 kg    Examination:  General exam: Appears calm and comfortable  Respiratory system: Clear to auscultation. Respiratory effort normal. No rales, rhonchi Cardiovascular system: S1 & S2 +. No  rubs, gallops or clicks.  Gastrointestinal system: Abdomen is nondistended, soft and nontender. Normal bowel sounds heard. Central nervous system: Alert and oriented. B/L LE weakness. Psychiatry:  Judgement and insight appear normal. Mood & affect appropriate.     Data Reviewed: I have personally reviewed following labs and imaging studies  CBC: Recent Labs  Lab 08/03/19 2046 08/04/19 0414  WBC 8.4 6.7  HGB 14.4 12.3  HCT 41.6 34.9*  MCV 76.2* 75.5*  PLT 254 213   Basic Metabolic Panel: Recent Labs  Lab 08/03/19 2046 08/04/19 0414  NA 141 139  K 4.0 3.9  CL 110 112*  CO2 24 22  GLUCOSE 111* 95  BUN 20 27*  CREATININE 0.85 0.69  CALCIUM 9.2 8.5*   GFR: Estimated Creatinine Clearance: 112.3 mL/min (by C-G formula based on SCr of 0.69 mg/dL). Liver Function Tests: Recent Labs  Lab 08/03/19 2046  AST 22  ALT 24  ALKPHOS 124  BILITOT 0.6  PROT 7.7  ALBUMIN 4.1   No results for input(s): LIPASE, AMYLASE in the last 168 hours. No results for input(s): AMMONIA in the last 168 hours. Coagulation Profile: No results for input(s): INR, PROTIME in the last 168 hours. Cardiac Enzymes: No results for input(s): CKTOTAL, CKMB, CKMBINDEX, TROPONINI in the last 168 hours. BNP (last 3 results) No results for input(s): PROBNP in the last 8760 hours. HbA1C: No results for input(s): HGBA1C in the last 72 hours. CBG: No results for input(s): GLUCAP in the last 168 hours. Lipid Profile: No results for input(s): CHOL, HDL, LDLCALC, TRIG, CHOLHDL, LDLDIRECT in the last 72  hours. Thyroid Function Tests: No results for input(s): TSH, T4TOTAL, FREET4, T3FREE, THYROIDAB in the last 72 hours. Anemia Panel: No results for input(s): VITAMINB12, FOLATE, FERRITIN, TIBC, IRON, RETICCTPCT in the last 72 hours. Sepsis Labs: No results for input(s): PROCALCITON, LATICACIDVEN in the last 168 hours.  Recent Results (from the past 240 hour(s))  SARS CORONAVIRUS 2 (TAT 6-24 HRS) Nasopharyngeal Nasopharyngeal Swab     Status: None   Collection Time: 08/03/19 10:50 PM   Specimen: Nasopharyngeal Swab  Result Value Ref Range Status   SARS Coronavirus 2 NEGATIVE NEGATIVE Final     Comment: (NOTE) SARS-CoV-2 target nucleic acids are NOT DETECTED. The SARS-CoV-2 RNA is generally detectable in upper and lower respiratory specimens during the acute phase of infection. Negative results do not preclude SARS-CoV-2 infection, do not rule out co-infections with other pathogens, and should not be used as the sole basis for treatment or other patient management decisions. Negative results must be combined with clinical observations, patient history, and epidemiological information. The expected result is Negative. Fact Sheet for Patients: SugarRoll.be Fact Sheet for Healthcare Providers: https://www.woods-mathews.com/ This test is not yet approved or cleared by the [place] FDA and  has been authorized for detection and/or diagnosis of SARS-CoV-2 by FDA under an Emergency Use Authorization (EUA). This EUA will remain  in effect (meaning this test can be used) for the duration of the COVID-19 declaration under Section 56 4(b)(1) of the Act, 21 U.S.C. section 360bbb-3(b)(1), unless the authorization is terminated or revoked sooner. Performed at [facility], [place] [address], [place], [place] [address]          Radiology Studies: No results found.      Scheduled Meds: . baclofen  20 mg Oral TID  . cholecalciferol  2,000 Units Oral Daily  . enoxaparin (LOVENOX) injection  40 mg Subcutaneous Q24H  . gabapentin  100 mg Oral BID  . irbesartan  150 mg Oral Daily  . multivitamin with minerals   Oral Daily  . oxybutynin  10 mg Oral Daily  . sodium chloride flush  3 mL Intravenous Once   Continuous Infusions: . methylPREDNISolone (SOLU-MEDROL) injection 1,000 mg (08/05/19 0929)     LOS: 3 days    Time spent: 30 mins    Wyvonnia Dusky, MD [facility] Pager [phone]  If 7PM-7AM, please contact night-coverage [email or web address] 08/06/2019, 8:21 AM

## 2019-08-06 NOTE — Progress Notes (Signed)
Physical Therapy Treatment Patient Details Name: Lisa Williams MRN: [id number] DOB: [birth year] Today's Date: 08/06/2019    History of Present Illness Patient is a pleasant 59 year old withPMH includes back pain, MS, chronic left shoulder pain, galactorrhea, hereditary and idiopathic neuropathy, HPV, hypercholesteremia, osteopenia, SVT, wrist fx, and HTN. Patient recieved COVID 19 vaccine (second dose) on saturday, on sunday c/o of weakness, inability to get out of chair and incontinance. Brought to the ED and admitted for MS exacerbation.    PT Comments    Pt was long sitting in bed upon therapist arriving. She agrees to PT sessio and is highly motivated throughout. Pt verbalizes her excitement to being evaluated for CIR placement. She continues to feel she is unsafe to return home without performing rehab first. Pt's resting HR 88 bpm. She was able to exit R side of bed (HOB elevated) with CGA + use of bed rails. Pt sat EOB x several minutes while therapist assisted with placement of AFOs/shoes. Pt is unable to I'ly place AFOs at this time. Pt stood with +2 min assist + gait belt for safety. Ambulated 50 ft with w/c follow + min assist. HR elevation to 150 bpm. With 45 sec. seated rest + relaxation techniques, HR decreased to 90s. Pt very fatigued after one trial of walking. She required + 2 min assist to return to long sitting in bed 2/2 to fatigue and inability to progress BLEs back into bed. Overall pt tolerated session well. She continues to be improving and very motivated for rehab. Therapist continues to recommend DC to CIR to assist pt to returning to PLOF.      Follow Up Recommendations  CIR     Equipment Recommendations  3in1 (PT)    Recommendations for Other Services       Precautions / Restrictions Precautions Precautions: Fall Precaution Comments: MS Required Braces or Orthoses: Other Brace(BLE AFOs) Restrictions Weight Bearing Restrictions: No    Mobility  Bed  Mobility Overal bed mobility: Needs Assistance Bed Mobility: Supine to Sit;Sit to Supine     Supine to sit: HOB elevated;Min guard Sit to supine: +2 for physical assistance;Min assist;HOB elevated   General bed mobility comments: Pt was able to progress from long sitting to short sit with CGA. increased time to perform with use of bedrails. She did however require +2 min assist to return to long sitting in bed after OOB activity. One therapist assist BLEs into bed while 2nd person controlled upper body decent.  Transfers Overall transfer level: Needs assistance Equipment used: Rolling walker (2 wheeled) Transfers: Sit to/from Stand Sit to Stand: +2 safety/equipment;+2 physical assistance;Min assist         General transfer comment: Pt was able to stand EOB 3 x throughout session with +2 min assist for safety. Bed height was elevated and vcs throughout for technique. Once standing, pt require only CGA for static standing balance but min assist for dynamic/ambulation  Ambulation/Gait Ambulation/Gait assistance: Min assist;+2 safety/equipment Gait Distance (Feet): 50 Feet Assistive device: Rolling walker (2 wheeled) Gait Pattern/deviations: Decreased step length - right;Wide base of support;Decreased step length - left Gait velocity: decreased   General Gait Details: Pt was able to tolerated ambulation ~ 50 ft with RW + w/c follow for safety. HR elevated to 151 bpm but quickly resolves to 90s after only 45 sec seated rest. PT requires increased time to ambulate with BLE AFOs donned. She has to ambulate with severe klateral wt shift to allow opposite LE advancement. Min  assist throughout for safety. Pt very fatigued post ambulation   Stairs             Wheelchair Mobility    Modified Rankin (Stroke Patients Only)       Balance Overall balance assessment: Needs assistance Sitting-balance support: Feet supported;No upper extremity supported Sitting balance-Leahy Scale:  Fair     Standing balance support: Bilateral upper extremity supported Standing balance-Leahy Scale: Fair Standing balance comment: Heavy BUE use on RW when standing. Able to weight shift with BUE support/mod A for balance.                            Cognition Arousal/Alertness: Awake/alert Behavior During Therapy: WFL for tasks assessed/performed Overall Cognitive Status: Within Functional Limits for tasks assessed                                 General Comments: Pt is A and O x 4 and very motivated.       Exercises      General Comments        Pertinent Vitals/Pain Pain Assessment: No/denies pain    Home Living                      Prior Function            PT Goals (current goals can now be found in the care plan section) Acute Rehab PT Goals Patient Stated Goal: " I hope I can get in to CIR befor I go home" Progress towards PT goals: Progressing toward goals    Frequency    Min 2X/week      PT Plan Current plan remains appropriate    Co-evaluation              AM-PAC PT "6 Clicks" Mobility   Outcome Measure  Help needed turning from your back to your side while in a flat bed without using bedrails?: A Little Help needed moving from lying on your back to sitting on the side of a flat bed without using bedrails?: A Lot Help needed moving to and from a bed to a chair (including a wheelchair)?: A Lot Help needed standing up from a chair using your arms (e.g., wheelchair or bedside chair)?: A Lot Help needed to walk in hospital room?: A Lot Help needed climbing 3-5 steps with a railing? : A Lot 6 Click Score: 13    End of Session Equipment Utilized During Treatment: Gait belt Activity Tolerance: Patient tolerated treatment well;Patient limited by fatigue Patient left: in bed;with call bell/phone within reach;with bed alarm set Nurse Communication: Mobility status PT Visit Diagnosis: Unsteadiness on feet  (R26.81);Other abnormalities of gait and mobility (R26.89);Muscle weakness (generalized) (M62.81);Difficulty in walking, not elsewhere classified (R26.2);Other symptoms and signs involving the nervous system (R29.898)     Time: 6283-6629 PT Time Calculation (min) (ACUTE ONLY): 20 min  Charges:  $Gait Training: 8-22 mins                     Julaine Fusi PTA 08/06/19, 11:53 AM

## 2019-08-06 NOTE — Progress Notes (Signed)
Subjective: Patient awake and alert.  Tolerating Solumedrol.  Working with therapy.    Objective: Current vital signs: BP (!) 111/57 (BP Location: Right Arm)   Pulse (!) 51   Temp 98.2 F (36.8 C) (Oral)   Resp 18   Ht 5\' 11"  (1.803 m)   Wt 123.1 kg   SpO2 97%   BMI 37.84 kg/m  Vital signs in last 24 hours: Temp:  [97.8 F (36.6 C)-98.4 F (36.9 C)] 98.2 F (36.8 C) (03/31 10-10-1988) Pulse Rate:  [51-99] 51 (03/31 0812) Resp:  [15-18] 18 (03/31 0812) BP: (111-145)/(57-87) 111/57 (03/31 0812) SpO2:  [93 %-100 %] 97 % (03/31 0812) Weight:  [123.1 kg] 123.1 kg (03/31 0446)  Intake/Output from previous day: 03/30 0701 - 03/31 0700 In: 480 [P.O.:480] Out: 2350 [Urine:2350] Intake/Output this shift: Total I/O In: 240 [P.O.:240] Out: 200 [Urine:200] Nutritional status:  Diet Order            Diet Heart Room service appropriate? Yes; Fluid consistency: Thin  Diet effective now              Neurologic Exam: Mental Status: Alert, oriented, thought content appropriate.  Speech fluent without evidence of aphasia.  Able to follow 3 step commands without difficulty. Cranial Nerves: II: Visual fields grossly normal, pupils equal, round, reactive to light and accommodation III,IV, VI: ptosis not present, extra-ocular motions intact bilaterally V,VII: smile symmetric, facial light touch sensation normal bilaterally VIII: hearing normal bilaterally IX,X: gag reflex present XI: bilateral shoulder shrug XII: midline tongue extension Motor: 5/5 in the BUE's.  Unable to bend at the knees.  Able to lift legs a few inches off the bed.  Sensory: Pinprick and light touch intact throughout, bilaterally  Lab Results: Basic Metabolic Panel: Recent Labs  Lab 08/03/19 2046 08/04/19 0414  NA 141 139  K 4.0 3.9  CL 110 112*  CO2 24 22  GLUCOSE 111* 95  BUN 20 27*  CREATININE 0.85 0.69  CALCIUM 9.2 8.5*    Liver Function Tests: Recent Labs  Lab 08/03/19 2046  AST 22  ALT 24   ALKPHOS 124  BILITOT 0.6  PROT 7.7  ALBUMIN 4.1   No results for input(s): LIPASE, AMYLASE in the last 168 hours. No results for input(s): AMMONIA in the last 168 hours.  CBC: Recent Labs  Lab 08/03/19 2046 08/04/19 0414  WBC 8.4 6.7  HGB 14.4 12.3  HCT 41.6 34.9*  MCV 76.2* 75.5*  PLT 254 213    Cardiac Enzymes: No results for input(s): CKTOTAL, CKMB, CKMBINDEX, TROPONINI in the last 168 hours.  Lipid Panel: No results for input(s): CHOL, TRIG, HDL, CHOLHDL, VLDL, LDLCALC in the last 168 hours.  CBG: No results for input(s): GLUCAP in the last 168 hours.  Microbiology: Results for orders placed or performed during the hospital encounter of 08/03/19  SARS CORONAVIRUS 2 (TAT 6-24 HRS) Nasopharyngeal Nasopharyngeal Swab     Status: None   Collection Time: 08/03/19 10:50 PM   Specimen: Nasopharyngeal Swab  Result Value Ref Range Status   SARS Coronavirus 2 NEGATIVE NEGATIVE Final    Comment: (NOTE) SARS-CoV-2 target nucleic acids are NOT DETECTED. The SARS-CoV-2 RNA is generally detectable in upper and lower respiratory specimens during the acute phase of infection. Negative results do not preclude SARS-CoV-2 infection, do not rule out co-infections with other pathogens, and should not be used as the sole basis for treatment or other patient management decisions. Negative results must be combined with clinical observations, patient  history, and epidemiological information. The expected result is Negative. Fact Sheet for Patients: SugarRoll.be Fact Sheet for Healthcare Providers: https://www.woods-mathews.com/ This test is not yet approved or cleared by the [place] FDA and  has been authorized for detection and/or diagnosis of SARS-CoV-2 by FDA under an Emergency Use Authorization (EUA). This EUA will remain  in effect (meaning this test can be used) for the duration of the COVID-19 declaration under Section 56 4(b)(1)  of the Act, 21 U.S.C. section 360bbb-3(b)(1), unless the authorization is terminated or revoked sooner. Performed at [facility], [place] [address], [place], [place] [address]     Coagulation Studies: No results for input(s): LABPROT, INR in the last 72 hours.  Imaging: No results found.  Medications:  I have reviewed the patient's current medications. Scheduled: . baclofen  20 mg Oral TID  . cholecalciferol  2,000 Units Oral Daily  . enoxaparin (LOVENOX) injection  40 mg Subcutaneous Q24H  . gabapentin  100 mg Oral BID  . irbesartan  150 mg Oral Daily  . multivitamin with minerals   Oral Daily  . oxybutynin  10 mg Oral Daily  . sodium chloride flush  3 mL Intravenous Once    Assessment/Plan: 59 year old female with a history of MS presenting with BLE weakness. MS exacerbation suspected. Today is day #3 of IV solumedrol.  Some mproved strength noted in the LE's.    Recommendations: 1. Solumedrol 1000mg  daily for 2 more days.  2. GI prophylaxis 3. PT/OT.  Patient being evaluated for CIR.     LOS: 3 days   Alexis Goodell, MD Neurology [phone] 08/06/2019  10:51 AM

## 2019-08-06 NOTE — PMR Pre-admission (Signed)
PMR Admission Coordinator Pre-Admission Assessment  Patient: Lisa Williams is an 59 y.o., female MRN: [id number] DOB: [birth year] Height: 5\' 11"  (180.3 cm) Weight: 123.1 kg  Insurance Information HMO:     PPO: yes     PCP:      IPA:      80/20:      OTHER:  PRIMARY: [employer]      Policy#: [id number]      Subscriber: pt CM Name: Lisa Williams.      Phone#: [phone]     Fax#: [phone] Pre-Cert#: [id number]      Employer:  [employer] ([id number]) provided by Lisa Williams with [facility] for admit to CIR. Pt is approved for 7 days. They have epic access for clinical updates. Lisa Williams's phone number for questions (p): [phone] Benefits:  Phone #: [phone]     Name: 3/31 Eff. Date: 05/09/2019     Deduct: none      Out of Pocket Max: $3400      Life Max: none CIR: $295 co pay per day days 1 until 6      SNF: no copay days 1 until 20; $175 co pay per day days 21 until 100 Outpatient: $15 per visit     Co-Pay: visits per medical neccesity Home Health: 100%      Co-Pay: visits per medical neccesity DME: 80%     Co-Pay: 205 Providers: in network  SECONDARY: none     Medicaid Application Date:       Case Manager:  Disability Application Date:       Case Worker:   The "Data Collection Information Summary" for patients in Inpatient Rehabilitation Facilities with attached "Privacy Act Statement-Health Care Records" was provided and verbally reviewed with: Patient  Emergency Contact Information Contact Information    Name Relation Home Work Mobile   Lisa Williams Sister [phone]    Lisa Williams Sister [phone]    Lisa Williams [phone]     Lisa Williams Mother [phone]        Current Medical History  Patient Admitting Diagnosis:  MS exacerbation  History of Present Illness: 59 year old female with known history of multiple sclerosis, presented 08/03/2019 with onset of bilateral lower extremity weakness with decreased sensations  that began at 3 pm. Patient had her second COVID vaccine the day before. Resembled last MS flare that occurred December 2019.   Neurology consulted with recommendation for Solumedrol iv for 3 days GI prophylaxis as well as PT And OT evals with suspected MS exacerbation. Noted improvements in LES and 2 additional days of solumedrol recommended for total of 5 days.     Patient's medical record from [facility]  has been reviewed by the rehabilitation admission coordinator and physician.  Past Medical History  Past Medical History:  Diagnosis Date  . Abdominal pain, right upper quadrant   . Back pain   . Calculus of kidney 12/09/2013  . Chronic back pain    unspecified  . Chronic left shoulder pain 07/19/2015  . Functional disorder of bladder    other  . Galactorrhea 11/26/2014   Chronic   . Hereditary and idiopathic neuropathy 08/19/2013  . HPV test positive   . Hypercholesteremia 08/19/2013  . Incomplete bladder emptying   . Microscopic hematuria   . MS (multiple sclerosis) (HCC)   . Muscle spasticity 05/21/2014  . Nonspecific findings on examination of urine    other  . Osteopenia   . Status post laparoscopic supracervical hysterectomy 11/26/2014  .  Tobacco user 11/26/2014  . Wrist fracture     Family History   family history includes Breast cancer in her maternal aunt; Diabetes in her maternal aunt and maternal grandmother; Heart failure in her father; Hyperlipidemia in her mother; Hypertension in her father, mother, sister, sister, and sister; Kidney disease in her father; Liver cancer in her maternal grandmother; Nephrolithiasis in her father; Osteoarthritis in her mother; Ovarian cancer in her paternal grandmother; Prostate cancer in her father; Sickle cell trait in her sister; Supraventricular tachycardia in her sister.  Prior Rehab/Hospitalizations Has the patient had prior rehab or hospitalizations prior to admission? Yes  Has the patient had major surgery during  100 days prior to admission? No   Current Medications  Current Facility-Administered Medications:  .  acetaminophen (TYLENOL) tablet 1,000 mg, 1,000 mg, Oral, Q6H PRN, Lisa Williams, 1,000 mg at 08/05/19 2031 .  baclofen (LIORESAL) tablet 20 mg, 20 mg, Oral, TID, Lisa Williams, 20 mg at 08/06/19 1439 .  cholecalciferol (VITAMIN D3) tablet 2,000 Units, 2,000 Units, Oral, Daily, Lisa Williams, 2,000 Units at 08/06/19 [phone] .  enoxaparin (LOVENOX) injection 40 mg, 40 mg, Subcutaneous, Q24H, Lisa Williams, 40 mg at 08/05/19 2129 .  gabapentin (NEURONTIN) capsule 100 mg, 100 mg, Oral, BID, Lisa Williams, 100 mg at 08/06/19 0840 .  irbesartan (AVAPRO) tablet 150 mg, 150 mg, Oral, Daily, Lisa Williams, 150 mg at 08/06/19 0840 .  magnesium hydroxide (MILK OF MAGNESIA) suspension 30 mL, 30 mL, Oral, Daily PRN, Lisa Williams .  Melene Muller ON 08/07/2019] methylPREDNISolone sodium succinate (SOLU-MEDROL) 1,000 mg in sodium chloride 0.9 % 50 mL IVPB, 1,000 mg, Intravenous, Daily, Lisa Williams .  multivitamin with minerals tablet, , Oral, Daily, Lisa Williams, 1 tablet at 08/06/19 0840 .  ondansetron (ZOFRAN) tablet 4 mg, 4 mg, Oral, Q6H PRN **OR** ondansetron (ZOFRAN) injection 4 mg, 4 mg, Intravenous, Q6H PRN, Lisa Williams .  oxybutynin (DITROPAN-XL) 24 hr tablet 10 mg, 10 mg, Oral, Daily, Lisa Williams, 10 mg at 08/06/19 0840 .  sodium chloride flush (NS) 0.9 % injection 3 mL, 3 mL, Intravenous, Once, Lisa Williams .  traZODone (DESYREL) tablet 25 mg, 25 mg, Oral, QHS PRN, Lisa Williams  Patients Current Diet:  Diet Order            Diet Heart Room service appropriate? Yes; Fluid consistency: Thin  Diet effective now              Precautions / Restrictions Precautions Precautions: Fall Precaution Comments: MS Restrictions Weight Bearing Restrictions: No   Has the patient had 2 or more falls or a fall with injury in the past year?  No  Prior Activity Level Limited Community (1-2x/wk): outpatietn therapy; drives with hand controls  Sponge bathes for tub bench felt unsafe. Uses RW in home. Drives self to outpatient therapy using hand controlled vehicle. Doe shave manual and motorized wheelchairs that have not used very recently.   Prior Functional Level Self Care: Did the patient need help bathing, dressing, using the toilet or eating? Independent sponge bathes due to tub bench felt unsafe  Indoor Mobility: Did the patient need assistance with walking from room to room (with or without device)? Independent  Stairs: Did the patient need assistance with internal or external stairs (with or without device)? Independent  Functional Cognition: Did the patient need help planning regular tasks such as  shopping or remembering to take medications? Independent  Home Assistive Devices / Equipment Home Assistive Devices/Equipment: Environmental consultant (specify type), Wheelchair, Hospital bed, Eyeglasses, Bedside commode/3-in-1 Home Equipment: Dan Humphreys - 2 wheels, Grab bars - tub/shower, Grab bars - toilet, Wheelchair - manual, Shower seat - built in, [employer] held shower head  Prior Device Use: Indicate devices/aids used by the patient prior to current illness, exacerbation or injury? Walker  Current Functional Level Cognition  Overall Cognitive Status: Within Functional Limits for tasks assessed Orientation Level: Oriented X4 General Comments: Pt is A and O x 4 and very motivated.     Extremity Assessment (includes Sensation/Coordination)  Upper Extremity Assessment: Generalized weakness(BUE grossly 4-/5. Grip/FMC WFLs.)  Lower Extremity Assessment: Generalized weakness, Defer to PT evaluation    ADLs  Overall ADL's : Needs assistance/impaired General ADL Comments: Pt reports she is independent for all BADL management at baseline. She uses a Manual WC for fxl mobility on "bad days" and using a 2WW for mobility on good days. She uses a  hospital bed, long handle reacher, and hand held showerhead as additional support for baseline function. Pt currently requires moderate to max assist for LB ADL management including dressing and bathing. She requires max assist to don/dof hospital socks at bed level this date. She demonstrates limited ability to perform SLR to assist.    Mobility  Overal bed mobility: Needs Assistance Bed Mobility: Supine to Sit, Sit to Supine Rolling: Min guard Supine to sit: HOB elevated, Min guard Sit to supine: +2 for physical assistance, Min assist, HOB elevated General bed mobility comments: Pt was able to progress from long sitting to short sit with CGA. increased time to perform with use of bedrails. She did however require +2 min assist to return to long sitting in bed after OOB activity. One therapist assist BLEs into bed while 2nd person controlled upper body decent.    Transfers  Overall transfer level: Needs assistance Equipment used: Rolling walker (2 wheeled) Transfers: Sit to/from Stand Sit to Stand: +2 safety/equipment, +2 physical assistance, Min assist  Lateral/Scoot Transfers: Supervision, Min guard General transfer comment: Pt was able to stand EOB 3 x throughout session with +2 min assist for safety. Bed height was elevated and vcs throughout for technique. Once standing, pt require only CGA for static standing balance but min assist for dynamic/ambulation    Ambulation / Gait / Stairs / Wheelchair Mobility  Ambulation/Gait Ambulation/Gait assistance: Min assist, +2 safety/equipment Gait Distance (Feet): 50 Feet Assistive device: Rolling walker (2 wheeled) Gait Pattern/deviations: Decreased step length - right, Wide base of support, Decreased step length - left General Gait Details: Pt was able to tolerated ambulation ~ 50 ft with RW + w/c follow for safety. HR elevated to 151 bpm but quickly resolves to 90s after only 45 sec seated rest. PT requires increased time to ambulate with BLE  AFOs donned. She has to ambulate with severe klateral wt shift to allow opposite LE advancement. Min assist throughout for safety. Pt very fatigued post ambulation Gait velocity: decreased    Posture / Balance Dynamic Sitting Balance Sitting balance - Comments: Steady static and dynamic sitting w/o LOB noted. Occasional use of 1 UE support  when performing dynamic sitting balance tasks. Balance Overall balance assessment: Needs assistance Sitting-balance support: Feet supported, No upper extremity supported Sitting balance-Leahy Scale: Fair Sitting balance - Comments: Steady static and dynamic sitting w/o LOB noted. Occasional use of 1 UE support  when performing dynamic sitting balance tasks. Postural control: Posterior  lean Standing balance support: Bilateral upper extremity supported Standing balance-Leahy Scale: Fair Standing balance comment: Heavy BUE use on RW when standing. Able to weight shift with BUE support/mod A for balance.    Special needs/care consideration BiPAP/CPAP  N/a CPM  N/a Continuous Drip IV  N/a Dialysis n/a Life Vest  N/a Oxygen  N/a Special Bed  N/a Trach Size  N/a Wound Vac n/a Skin  intact Bowel mgmt: continent, last BM: 08/06/19 Bladder mgmt:  continent Diabetic mgmt:  N/a Behavioral consideration  N/a Chemo/radiation n/a Designated visitor is : (she will determine once she gets to CIR per her request).    Previous Home Environment  Living Arrangements: Other relatives(lives with sister and Mother)  Lives With: Other (Comment)(sister, [name] and mother) Available Help at Discharge: Family, Available 24 hours/day Type of Home: House Home Layout: Two level, Able to live on main level with bedroom/bathroom Home Access: Ramped entrance Bathroom Shower/Tub: Chiropodist: Standard Bathroom Accessibility: Yes How Accessible: Accessible via walker [place]: No Additional Comments: Pt denies falls hx in past  year  Discharge Living Setting Plans for Discharge Living Setting: Lives with (comment)(sister, [facility] and her elderly Mom) Type of Home at Discharge: House Discharge Home Layout: Able to live on main level with bedroom/bathroom, Two level Discharge Home Access: Stanley entrance Discharge Bathroom Shower/Tub: Tub/shower unit Discharge Bathroom Toilet: Standard(with 3 n 1 over toliet) Discharge Bathroom Accessibility: Yes How Accessible: Accessible via walker Does the patient have any problems obtaining your medications?: No  Social/Family/Support Systems Contact Information: sister, [name] Anticipated Caregiver: sister Anticipated Caregiver's Contact Information: see above Ability/Limitations of Caregiver: sister works days; Mom has early dementia Caregiver Availability: Evenings only Discharge Plan Discussed with Primary Caregiver: Yes Is Caregiver In Agreement with Plan?: Yes Does Caregiver/Family have Issues with Lodging/Transportation while Pt is in Rehab?: No  Goals/Additional Needs Patient/Family Goal for Rehab: Mod I to supervision with PT and OT Expected length of stay: ELOS 7 to 10 days Equipment Needs: Patietn states tub bench at home not felt safe therefore she sponge bathes Pt/Family Agrees to Admission and willing to participate: Yes Program Orientation Provided & Reviewed with Pt/Caregiver Including Roles  & Responsibilities: Yes  Barriers to Discharge: Decreased caregiver support  Decrease burden of Care through IP rehab admission: n/a  Possible need for SNF placement upon discharge: n/a  Patient Condition: I have reviewed medical records from [facility], spoken with CM, and patient. I discussed via phone for inpatient rehabilitation assessment.  Patient will benefit from ongoing PT and OT, can actively participate in 3 hours of therapy a day 5 days of the week, and can make measurable gains during the admission.  Patient will also benefit from  the coordinated team approach during an Inpatient Acute Rehabilitation admission.  The patient will receive intensive therapy as well as Rehabilitation physician, nursing, social worker, and care management interventions.  Due to bladder management, bowel management, safety, skin/wound care, disease management, medication administration, pain management and patient education the patient requires 24 hour a day rehabilitation nursing.  The patient is currently min assist with mobility and basic ADLs.  Discharge setting and therapy post discharge at home with outpatient is anticipated.  Patient has agreed to participate in the Acute Inpatient Rehabilitation Program and will admit 08/08/19.  Preadmission Screen Completed By: Danne Baxter, RN MSN with brief updates by Cleatrice Burke, 08/06/2019 3:34 PM ______________________________________________________________________   Discussed status with Dr. Naaman Plummer on 08/08/19 at 10:24AM and  received approval for admission today.  Admission Coordinator: Ottie Glazier, RN MSN  Beckie Salts Foye Spurling, RN, time 10:25AM/Date 08/08/19   Assessment/Plan: Diagnosis: MS exacerbation 1. Does the need for close, 24 hr/day Medical supervision in concert with the patient's rehab needs make it unreasonable for this patient to be served in a less intensive setting? Yes 2. Co-Morbidities requiring supervision/potential complications: spasticity, chronic pain 3. Due to bladder management, bowel management, safety, skin/wound care, disease management, medication administration, pain management and patient education, does the patient require 24 hr/day rehab nursing? Yes 4. Does the patient require coordinated care of a physician, rehab nurse, PT and OT to address physical and functional deficits in the context of the above medical diagnosis(es)? Yes Addressing deficits in the following areas: balance, endurance, locomotion, strength, transferring, bowel/bladder control,  bathing, dressing, feeding, grooming, toileting and psychosocial support 5. Can the patient actively participate in an intensive therapy program of at least 3 hrs of therapy 5 days a week? Yes 6. The potential for patient to make measurable gains while on inpatient rehab is excellent 7. Anticipated functional outcomes upon discharge from inpatient rehab: modified independent and supervision PT, modified independent and supervision OT, n/a SLP 8. Estimated rehab length of stay to reach the above functional goals is: 7-11 days 9. Anticipated discharge destination: Home 10. Overall Rehab/Functional Prognosis: excellent   Williams Signature: Ranelle Oyster, Williams, [facility] Rehabilitation 08/08/2019

## 2019-08-06 NOTE — Plan of Care (Signed)
  Problem: Education: Goal: Knowledge of General Education information will improve Description: Including pain rating scale, medication(s)/side effects and non-pharmacologic comfort measures Outcome: Progressing   Problem: Health Behavior/Discharge Planning: Goal: Ability to manage health-related needs will improve Outcome: Not Progressing Note: Patient needs at least one more day of IVPB Solu-Medrol per neurology. Will continue to monitor for the remainder of the shift. Jari Favre [facility]

## 2019-08-06 NOTE — Progress Notes (Signed)
Inpatient Rehabilitation Admissions Coordinator  I have begun insurance authorization with health team [employer] for a possible inpt rehab admit pending insurance approval when pt medically ready for d/c.  Ottie Glazier, RN, MSN Rehab Admissions Coordinator [phone] 08/06/2019 3:13 PM

## 2019-08-06 NOTE — Care Management Important Message (Signed)
Important Message  Patient Details  Name: Lisa Williams MRN: [id number] Date of Birth: [birth year]   Medicare Important Message Given:  Yes     Bernadette Hoit 08/06/2019, 11:19 AM

## 2019-08-06 NOTE — Progress Notes (Signed)
Inpatient Rehabilitation Admissions Coordinator  Inpatient rehab consult received. I spoke with patient by phone. We discussed goals and expectations of an inpt rehab admit and patient prefers CIR rather than other rehab venues. Highly motivated and independent pta. Has done well with outpatient therapies. I await further medical plans to be outlined today before proceeding with [employer] authorization for an inpt rehab admit at [facility]. Patient is an excellent candidate pending insurance approval and bed availability when patient medically ready for discharge.  Ottie Glazier, RN, MSN Rehab Admissions Coordinator [phone] 08/06/2019 8:41 AM

## 2019-08-07 ENCOUNTER — Ambulatory Visit: Payer: PPO

## 2019-08-07 LAB — BASIC METABOLIC PANEL
Anion gap: 8 (ref 5–15)
BUN: 28 mg/dL — ABNORMAL HIGH (ref 6–20)
CO2: 25 mmol/L (ref 22–32)
Calcium: 9.3 mg/dL (ref 8.9–10.3)
Chloride: 108 mmol/L (ref 98–111)
Creatinine, Ser: 0.66 mg/dL (ref 0.44–1.00)
GFR calc Af Amer: >60 mL/min (ref >60)
GFR calc non Af Amer: >60 mL/min (ref >60)
Glucose, Bld: 133 mg/dL — ABNORMAL HIGH (ref 70–99)
Potassium: 4 mmol/L (ref 3.5–5.1)
Sodium: 141 mmol/L (ref 135–145)

## 2019-08-07 LAB — IRON AND TIBC
Iron: 71 ug/dL (ref 28–170)
Saturation Ratios: 28 % (ref 10.4–31.8)
TIBC: 253 ug/dL (ref 250–450)
UIBC: 182 ug/dL

## 2019-08-07 LAB — FERRITIN: Ferritin: 36 ng/mL (ref 11–307)

## 2019-08-07 LAB — CBC
HCT: 38 % (ref 36.0–46.0)
Hemoglobin: 13.2 g/dL (ref 12.0–15.0)
MCH: 26.3 pg (ref 26.0–34.0)
MCHC: 34.7 g/dL (ref 30.0–36.0)
MCV: 75.7 fL — ABNORMAL LOW (ref 80.0–100.0)
Platelets: 256 10*3/uL (ref 150–400)
RBC: 5.02 MIL/uL (ref 3.87–5.11)
RDW: 14.2 % (ref 11.5–15.5)
WBC: 14.3 10*3/uL — ABNORMAL HIGH (ref 4.0–10.5)
nRBC: 0 % (ref 0.0–0.2)

## 2019-08-07 MED ORDER — FAMOTIDINE 20 MG PO TABS
20.0000 mg | ORAL_TABLET | Freq: Two times a day (BID) | ORAL | Status: DC
  Administered 2019-08-07 (×2): 20 mg via ORAL
  Filled 2019-08-07 (×3): qty 1

## 2019-08-07 NOTE — Progress Notes (Signed)
PROGRESS NOTE    Lisa Williams  NLG:[id number] DOB: 05-29-1960 DOA: 08/03/2019 PCP: Kirk Ruths, MD       Assessment & Plan:   Active Problems:   Multiple sclerosis exacerbation (HCC)   Multiple sclerosis exacerbation: relapsing/remitting. W/ bilateral lower extremity weakness and decreased light touch sensation which has improved today. Will continue on home dose of baclofen, gabapentin. PT is now recs CIR. Solumedrol 1000 mg daily x 1 day more to equal 5 days total as per neuro. Neuro following and recs apprec   Hypertension: continue irbesartan   Incomplete bladder emptying: will continue oxybutynin   Sinus bradycardia: chronic as per pt. Asymptomatic. Not on any BB or CCB. Will continue to monitor on tele    DVT prophylaxis: lovenox Code Status: full  Family Communication:  Disposition Plan: PT is now recs CIR, and pt is agreeable to go to CIR; can d/c to CIR when cleared by neuro    Consultants:  Neuro   Procedures:    Antimicrobials:   Subjective: Pt c/o fatigue  Objective: Vitals:   08/06/19 1540 08/06/19 2000 08/07/19 0421 08/07/19 0728  BP: 122/67 138/74 (!) 142/70 112/61  Pulse: (!) 51 64 (!) 58 (!) 43  Resp: 18  12 18   Temp: 98.1 F (36.7 C) 98.5 F (36.9 C) 97.6 F (36.4 C) 98.7 F (37.1 C)  TempSrc: Oral Oral Oral   SpO2: 97% 97% 96% 98%  Weight:   122.3 kg   Height:        Intake/Output Summary (Last 24 hours) at 08/07/2019 0840 Last data filed at 08/07/2019 0534 Gross per 24 hour  Intake 720 ml  Output 1350 ml  Net -630 ml   Filed Weights   08/05/19 0410 08/06/19 0446 08/07/19 0421  Weight: 123.1 kg 123.1 kg 122.3 kg    Examination:  General exam: Appears calm and comfortable  Respiratory system: Clear to auscultation. No accessory muscle use. No wheezes Cardiovascular system: S1 & S2 +. No  rubs, gallops or clicks.  Gastrointestinal system: Abdomen is nondistended, soft and nontender. Normal bowel sounds  heard. Central nervous system: Alert and oriented. B/L LE weakness. Psychiatry: Judgement and insight appear normal. Mood & affect appropriate.     Data Reviewed: I have personally reviewed following labs and imaging studies  CBC: Recent Labs  Lab 08/03/19 2046 08/04/19 0414 08/07/19 0450  WBC 8.4 6.7 14.3*  HGB 14.4 12.3 13.2  HCT 41.6 34.9* 38.0  MCV 76.2* 75.5* 75.7*  PLT 254 213 081   Basic Metabolic Panel: Recent Labs  Lab 08/03/19 2046 08/04/19 0414 08/07/19 0450  NA 141 139 141  K 4.0 3.9 4.0  CL 110 112* 108  CO2 24 22 25   GLUCOSE 111* 95 133*  BUN 20 27* 28*  CREATININE 0.85 0.69 0.66  CALCIUM 9.2 8.5* 9.3   GFR: Estimated Creatinine Clearance: 111.9 mL/min (by C-G formula based on SCr of 0.66 mg/dL). Liver Function Tests: Recent Labs  Lab 08/03/19 2046  AST 22  ALT 24  ALKPHOS 124  BILITOT 0.6  PROT 7.7  ALBUMIN 4.1   No results for input(s): LIPASE, AMYLASE in the last 168 hours. No results for input(s): AMMONIA in the last 168 hours. Coagulation Profile: No results for input(s): INR, PROTIME in the last 168 hours. Cardiac Enzymes: No results for input(s): CKTOTAL, CKMB, CKMBINDEX, TROPONINI in the last 168 hours. BNP (last 3 results) No results for input(s): PROBNP in the last 8760 hours. HbA1C: No results for  input(s): HGBA1C in the last 72 hours. CBG: No results for input(s): GLUCAP in the last 168 hours. Lipid Profile: No results for input(s): CHOL, HDL, LDLCALC, TRIG, CHOLHDL, LDLDIRECT in the last 72 hours. Thyroid Function Tests: No results for input(s): TSH, T4TOTAL, FREET4, T3FREE, THYROIDAB in the last 72 hours. Anemia Panel: Recent Labs    08/07/19 0450  FERRITIN 36  TIBC 253  IRON 71   Sepsis Labs: No results for input(s): PROCALCITON, LATICACIDVEN in the last 168 hours.  Recent Results (from the past 240 hour(s))  SARS CORONAVIRUS 2 (TAT 6-24 HRS) Nasopharyngeal Nasopharyngeal Swab     Status: None   Collection Time:  08/03/19 10:50 PM   Specimen: Nasopharyngeal Swab  Result Value Ref Range Status   SARS Coronavirus 2 NEGATIVE NEGATIVE Final    Comment: (NOTE) SARS-CoV-2 target nucleic acids are NOT DETECTED. The SARS-CoV-2 RNA is generally detectable in upper and lower respiratory specimens during the acute phase of infection. Negative results do not preclude SARS-CoV-2 infection, do not rule out co-infections with other pathogens, and should not be used as the sole basis for treatment or other patient management decisions. Negative results must be combined with clinical observations, patient history, and epidemiological information. The expected result is Negative. Fact Sheet for Patients: HairSlick.no Fact Sheet for Healthcare Providers: quierodirigir.com This test is not yet approved or cleared by the [place] FDA and  has been authorized for detection and/or diagnosis of SARS-CoV-2 by FDA under an Emergency Use Authorization (EUA). This EUA will remain  in effect (meaning this test can be used) for the duration of the COVID-19 declaration under Section 56 4(b)(1) of the Act, 21 U.S.C. section 360bbb-3(b)(1), unless the authorization is terminated or revoked sooner. Performed at [facility], [address], [place], [place] [address]          Radiology Studies: No results found.      Scheduled Meds: . baclofen  20 mg Oral TID  . cholecalciferol  2,000 Units Oral Daily  . enoxaparin (LOVENOX) injection  40 mg Subcutaneous Q24H  . gabapentin  100 mg Oral BID  . irbesartan  150 mg Oral Daily  . multivitamin with minerals   Oral Daily  . oxybutynin  10 mg Oral Daily  . sodium chloride flush  3 mL Intravenous Once   Continuous Infusions: . methylPREDNISolone (SOLU-MEDROL) injection       LOS: 4 days    Time spent: 30 mins    Charise Killian, MD [facility] Pager [phone]  If 7PM-7AM,  please contact night-coverage [email or web address] 08/07/2019, 8:40 AM

## 2019-08-07 NOTE — H&P (Signed)
Physical Medicine and Rehabilitation Admission H&P    Chief Complaint  Patient presents with  . Weakness  : HPI: Lisa Williams is a 59 year old right-handed African-American female with history of multiple sclerosis diagnosed 1995 with multiple exacerbations followed by Dr. [facility], hypertension, hereditary and idiopathic neuropathy, HPV chronic back pain, tobacco abuse, hyperlipidemia and neurogenic bladder.  Per chart review patient independent with assistive device prior to admission for ambulating short distances and uses a manual wheelchair at home and was receiving some outpatient therapies.  She does drive with hand controls.  Presented 08/03/2019 with increasing bilateral lower extremity weakness and decrease in sensation.  Patient had just received her Covid vaccine the day prior.  Admission chemistries unremarkable, SARS coronavirus negative urinalysis negative nitrite.  Neurology follow-up suspect MS exacerbation placed on 5 days IV Solu-Medrol.  Subcutaneous Lovenox for DVT prophylaxis.  Therapy evaluations completed and patient was admitted for a comprehensive rehab program tolerating a regular diet.  Review of Systems  Constitutional: Negative for chills and fever.  HENT: Negative for hearing loss.   Eyes: Negative for blurred vision and double vision.  Respiratory: Negative for cough and shortness of breath.   Cardiovascular: Negative for chest pain and palpitations.  Gastrointestinal: Positive for constipation. Negative for heartburn and nausea.  Genitourinary: Negative for dysuria, flank pain and hematuria.       Bladder stress incontinence  Musculoskeletal: Positive for back pain, joint pain and myalgias.       Chronic back pain  Skin: Negative for rash.  Neurological: Positive for weakness.       Spasticity  All other systems reviewed and are negative.  Past Medical History:  Diagnosis Date  . Abdominal pain, right upper quadrant   .  Back pain   . Calculus of kidney 12/09/2013  . Chronic back pain    unspecified  . Chronic left shoulder pain 07/19/2015  . Functional disorder of bladder    other  . Galactorrhea 11/26/2014   Chronic   . Hereditary and idiopathic neuropathy 08/19/2013  . HPV test positive   . Hypercholesteremia 08/19/2013  . Incomplete bladder emptying   . Microscopic hematuria   . MS (multiple sclerosis) (HCC)   . Muscle spasticity 05/21/2014  . Nonspecific findings on examination of urine    other  . Osteopenia   . Status post laparoscopic supracervical hysterectomy 11/26/2014  . Tobacco user 11/26/2014  . Wrist fracture    Past Surgical History:  Procedure Laterality Date  . bilateral tubal ligation  1996  . BREAST CYST EXCISION Left 2002  . KNEE SURGERY     right  . LAPAROSCOPIC SUPRACERVICAL HYSTERECTOMY  08/05/2013  . ORIF WRIST FRACTURE Left 01/17/2017   Procedure: OPEN REDUCTION INTERNAL FIXATION (ORIF) WRIST FRACTURE;  Surgeon: Lyndle Herrlich, MD;  Location: [facility] ORS;  Service: Orthopedics;  Laterality: Left;  . TUBAL LIGATION Bilateral   . VAGINAL HYSTERECTOMY  03/2006   Family History  Problem Relation Age of Onset  . Sickle cell trait Sister   . Hypertension Sister   . Supraventricular tachycardia Sister   . Hypertension Sister   . Diabetes Maternal Grandmother   . Liver cancer Maternal Grandmother   . Diabetes Maternal Aunt   . Breast cancer Maternal Aunt        great MAT  . Ovarian cancer Paternal Grandmother   . Nephrolithiasis Father   . Hypertension Father   . Prostate cancer Father   . Kidney disease Father   .  Heart failure Father   . Hypertension Sister   . Osteoarthritis Mother   . Hypertension Mother   . Hyperlipidemia Mother   . GU problems Neg Hx   . Urolithiasis Neg Hx    Social History:  reports that she has quit smoking. Her smoking use included cigarettes. She smoked 0.50 packs per day. She has never used smokeless tobacco. She reports that she does not  drink alcohol or use drugs. Allergies:  Allergies  Allergen Reactions  . Amlodipine Other (See Comments)    edema  . Povidone Iodine Rash   Medications Prior to Admission  Medication Sig Dispense Refill  . baclofen (LIORESAL) 20 MG tablet Take 20 mg by mouth 3 (three) times daily.     . Cholecalciferol (EQL VITAMIN D3) 25 MCG (1000 UT) tablet Take 2,000 Units by mouth daily.    Marland Kitchen gabapentin (NEURONTIN) 100 MG capsule Take 100 mg by mouth 2 (two) times daily.     Marland Kitchen ibandronate (BONIVA) 150 MG tablet TAKE 1 TABLET BY MOUTH EVERY 30 DAYS FIRST THING IN THE MORNING WITH A FULL GLASS OF WATER. DO NOT EAT, DRINK OR LIE DOWN FOR 60 MINUTES AFTER    . oxybutynin (DITROPAN-XL) 10 MG 24 hr tablet Take 1 tablet (10 mg total) by mouth daily. 30 tablet 11  . telmisartan (MICARDIS) 40 MG tablet Take 40 mg by mouth daily.    Marland Kitchen oxybutynin (DITROPAN) 5 MG tablet TAKE 1 TABLET BY MOUTH TWICE A DAY 180 tablet 1    Drug Regimen Review Drug regimen was reviewed and remains appropriate with no significant issues identified  Home: Home Living Family/patient expects to be discharged to:: Private residence Living Arrangements: Other relatives(lives with sister and Mother) Available Help at Discharge: Family, Available 24 hours/day Type of Home: House Home Access: Ramped entrance Home Layout: Two level, Able to live on main level with bedroom/bathroom Bathroom Shower/Tub: Engineer, manufacturing systems: Standard Bathroom Accessibility: Yes Home Equipment: Environmental consultant - 2 wheels, Grab bars - tub/shower, Grab bars - toilet, Wheelchair - manual, Shower seat - built in, Hand held shower head Additional Comments: Pt denies falls hx in past year  Lives With: Other (Comment)(sister, [name] and mother)   Functional History: Prior Function Level of Independence: Independent with assistive device(s) Comments: ambulates short distance with outpatient PT, uses manual chair at home with RW for mobility, drives with  hand controls, dresses ind.  Functional Status:  Mobility: Bed Mobility Overal bed mobility: Needs Assistance Bed Mobility: Supine to Sit, Sit to Supine Rolling: Min guard Supine to sit: HOB elevated, Min guard Sit to supine: +2 for physical assistance, Min assist, HOB elevated General bed mobility comments: Pt was able to progress from long sitting to short sit with CGA. increased time to perform with use of bedrails. She did however require +2 min assist to return to long sitting in bed after OOB activity. One therapist assist BLEs into bed while 2nd person controlled upper body decent. Transfers Overall transfer level: Needs assistance Equipment used: Rolling walker (2 wheeled) Transfers: Sit to/from Stand Sit to Stand: +2 safety/equipment, +2 physical assistance, Min assist  Lateral/Scoot Transfers: Supervision, Min guard General transfer comment: Pt was able to stand EOB 3 x throughout session with +2 min assist for safety. Bed height was elevated and vcs throughout for technique. Once standing, pt require only CGA for static standing balance but min assist for dynamic/ambulation Ambulation/Gait Ambulation/Gait assistance: Min assist, +2 safety/equipment Gait Distance (Feet): 50 Feet Assistive device: Rolling  walker (2 wheeled) Gait Pattern/deviations: Decreased step length - right, Wide base of support, Decreased step length - left General Gait Details: Pt was able to tolerated ambulation ~ 50 ft with RW + w/c follow for safety. HR elevated to 151 bpm but quickly resolves to 90s after only 45 sec seated rest. PT requires increased time to ambulate with BLE AFOs donned. She has to ambulate with severe klateral wt shift to allow opposite LE advancement. Min assist throughout for safety. Pt very fatigued post ambulation Gait velocity: decreased    ADL: ADL Overall ADL's : Needs assistance/impaired General ADL Comments: Pt reports she is independent for all BADL management at  baseline. She uses a Manual WC for fxl mobility on "bad days" and using a 2WW for mobility on good days. She uses a hospital bed, long handle reacher, and hand held showerhead as additional support for baseline function. Pt currently requires moderate to max assist for LB ADL management including dressing and bathing. She requires max assist to don/dof hospital socks at bed level this date. She demonstrates limited ability to perform SLR to assist.  Cognition: Cognition Overall Cognitive Status: Within Functional Limits for tasks assessed Orientation Level: Oriented X4 Cognition Arousal/Alertness: Awake/alert Behavior During Therapy: WFL for tasks assessed/performed Overall Cognitive Status: Within Functional Limits for tasks assessed General Comments: Pt is A and O x 4 and very motivated.   Physical Exam: Blood pressure (!) 142/70, pulse (!) 58, temperature 97.6 F (36.4 C), temperature source Oral, resp. rate 12, height 5\' 11"  (1.803 m), weight 122.3 kg, SpO2 96 %. Physical Exam  Constitutional: She is oriented to person, place, and time. She appears well-developed and well-nourished. No distress.  HENT:  Head: Normocephalic.  Nose: Nose normal.  Mouth/Throat: Oropharynx is clear and moist.  Eyes: Pupils are equal, round, and reactive to light. Conjunctivae and EOM are normal.  Neck: No thyromegaly present.  Cardiovascular: Normal rate and regular rhythm. Exam reveals no friction rub.  No murmur heard. Respiratory: Effort normal. No respiratory distress. She has no wheezes.  GI: Soft. She exhibits no distension. There is no abdominal tenderness.  Musculoskeletal:        General: Normal range of motion.     Cervical back: Normal range of motion.  Neurological: She is alert and oriented to person, place, and time.     Skin: Skin is warm and dry. She is not diaphoretic. No erythema.  Psychiatric: She has a normal mood and affect. Her behavior is normal.    Results for orders placed  or performed during the hospital encounter of 08/03/19 (from the past 48 hour(s))  CBC     Status: Abnormal   Collection Time: 08/07/19  4:50 AM  Result Value Ref Range   WBC 14.3 (H) 4.0 - 10.5 K/uL   RBC 5.02 3.87 - 5.11 MIL/uL   Hemoglobin 13.2 12.0 - 15.0 g/dL   HCT 10/07/19 40.9 - 81.1 %   MCV 75.7 (L) 80.0 - 100.0 fL   MCH 26.3 26.0 - 34.0 pg   MCHC 34.7 30.0 - 36.0 g/dL   RDW 91.4 78.2 - 95.6 %   Platelets 256 150 - 400 K/uL   nRBC 0.0 0.0 - 0.2 %    Comment: Performed at [facility], [address] [place], [place]  Basic metabolic panel     Status: Abnormal   Collection Time: 08/07/19  4:50 AM  Result Value Ref Range   Sodium 141 135 - 145 mmol/L  Potassium 4.0 3.5 - 5.1 mmol/L   Chloride 108 98 - 111 mmol/L   CO2 25 22 - 32 mmol/L   Glucose, Bld 133 (H) 70 - 99 mg/dL    Comment: Glucose reference range applies only to samples taken after fasting for at least 8 hours.   BUN 28 (H) 6 - 20 mg/dL   Creatinine, Ser 0.66 0.44 - 1.00 mg/dL   Calcium 9.3 8.9 - 10.3 mg/dL   GFR calc non Af Amer >60 >60 mL/min   GFR calc Af Amer >60 >60 mL/min   Anion gap 8 5 - 15    Comment: Performed at [facility], [address] [place], [place] [address]  Iron and TIBC     Status: None   Collection Time: 08/07/19  4:50 AM  Result Value Ref Range   Iron 71 28 - 170 ug/dL   TIBC 253 250 - 450 ug/dL   Saturation Ratios 28 10.4 - 31.8 %   UIBC 182 ug/dL    Comment: Performed at [facility], [address] [place], [place] [address]  Ferritin     Status: None   Collection Time: 08/07/19  4:50 AM  Result Value Ref Range   Ferritin 36 11 - 307 ng/mL    Comment: Performed at [facility], [address] [place], [place] [address]   No results found.     Medical Problem List and Plan: 1.  Increasing bilateral lower extremity weakness with spasticity secondary to MS exacerbation.  IV Solu-Medrol completed.  Patient diagnosed 51 with  multiple MS exacerbations in the past followed by Dr. Larinda Buttery clinic  -patient may  shower  -ELOS/Goals: 7-10 days, mod I to supervision goals 2.  Antithrombotics: -DVT/anticoagulation: Lovenox  -antiplatelet therapy: N/A 3. Pain Management/history of hereditary and idiopathic neuropathy: Neurontin 100 mg twice daily, baclofen 20 mg 3 times daily 4. Mood: Provide emotional support  -antipsychotic agents: N/A 5. Neuropsych: This patient is capable of making decisions on her own behalf. 6. Skin/Wound Care: Routine skin checks 7. Fluids/Electrolytes/Nutrition: Routine in and outs with follow-up chemistries 8.  Hypertension.  Avapro 150 mg daily.  Monitor with increased mobility 9.  Neurogenic bladder.  Check PVR x3.  Continue Ditropan 10 mg daily 10.  Tobacco abuse.  Counseling       Cathlyn Parsons, PA-C 08/07/2019

## 2019-08-07 NOTE — Progress Notes (Signed)
Physical Therapy Treatment Patient Details Name: Lisa Williams MRN: [id number] DOB: 10/24/1960 Today's Date: 08/07/2019    History of Present Illness Patient is a pleasant 59 year old withPMH includes back pain, MS, chronic left shoulder pain, galactorrhea, hereditary and idiopathic neuropathy, HPV, hypercholesteremia, osteopenia, SVT, wrist fx, and HTN. Patient recieved COVID 19 vaccine (second dose) on saturday, on sunday c/o of weakness, inability to get out of chair and incontinance. Brought to the ED and admitted for MS exacerbation.    PT Comments    Pt was long sitting in bed upon arriving. She agrees to PT session and is motivated and cooperative throughout. " I've had a rough morning. It took 5 attempts to get my IV started and now it hurts when I use this hand (referring to RUE)." Pt required slightly increased assistance this date to get out of bed. Min assist to get to EOB sitting but mod-max to return to supine after OOB activity. Pt was able to stand 2 x  however unable to tolerate pain in IV site. Unable to ambulate due to this. RN aware. Therapist did discuss use/order of contracture boot (PRAFO) to prevent ankle contractures. Pt is usually in her personal AFOs throughout the day but since admission, only wearing short periods of time during therapy sessions. Recommend use of PRAFO boot to maintain ankle ROM/ decrease likelihood of contracture N educated of proper use. Overall pt was limited by pain this date but PT will continue to progress as able per POC. Continued recommendation of CIR to assist pt with returning to PLOF addressing deficits with gait, strength, and safe functional mobility.     Follow Up Recommendations  CIR     Equipment Recommendations  3in1 (PT)    Recommendations for Other Services       Precautions / Restrictions Precautions Precautions: Fall Precaution Comments: MS Required Braces or Orthoses: Other Brace(BLE AFOs) Restrictions Weight  Bearing Restrictions: No    Mobility  Bed Mobility Overal bed mobility: Needs Assistance Bed Mobility: Supine to Sit;Sit to Supine Rolling: Min assist   Supine to sit: HOB elevated;Min assist Sit to supine: Mod assist;Max assist;HOB elevated;+2 for safety/equipment(+2 for safety only)   General bed mobility comments: Pt was able to progress from long sitting in bed to short sitting EOB with min assist + use of bed rails and vcs for sequencing. Pt required slightly increased assistance this date 2/2 to fatigue from morning activity  Transfers Overall transfer level: Needs assistance Equipment used: Rolling walker (2 wheeled) Transfers: Sit to/from Stand Sit to Stand: +2 safety/equipment;+2 physical assistance;Min assist         General transfer comment: +2 min assist to safely stand EOB 2 x. Unable to tolerate standing this date 2/2 to pain in IV site( R wrist). pt unable to tolerate standingmore than 20 sec.  Ambulation/Gait             General Gait Details: unable to progress this date 2/2 to IV site pain limitations   Stairs             Wheelchair Mobility    Modified Rankin (Stroke Patients Only)       Balance   Sitting-balance support: Feet supported;No upper extremity supported Sitting balance-Leahy Scale: Fair Sitting balance - Comments: Steady static and dynamic sitting w/o LOB noted. Occasional use of 1 UE support  when performing dynamic sitting balance tasks. Postural control: Posterior lean Standing balance support: Bilateral upper extremity supported Standing balance-Leahy Scale: Fair Standing  balance comment: Heavy BUE use on RW when standing. unable to tolerate standing more than 20 sec this date 2/2 to pain in IV site                            Cognition Arousal/Alertness: Awake/alert Behavior During Therapy: WFL for tasks assessed/performed Overall Cognitive Status: Within Functional Limits for tasks assessed                                  General Comments: Pt is A and O x 4 and very motivated.       Exercises General Exercises - Lower Extremity Ankle Circles/Pumps: PROM;Both;5 reps;Supine Long Arc Quad: AROM;10 reps;Both;Seated    General Comments        Pertinent Vitals/Pain Pain Assessment: 0-10 Pain Score: 3  Pain Location: IV site Pain Descriptors / Indicators: Sharp Pain Intervention(s): Limited activity within patient's tolerance;Monitored during session    Home Living                      Prior Function            PT Goals (current goals can now be found in the care plan section) Acute Rehab PT Goals Patient Stated Goal: " I want to get stronger so I can get home" Progress towards PT goals: Progressing toward goals    Frequency    Min 2X/week      PT Plan Current plan remains appropriate    Co-evaluation              AM-PAC PT "6 Clicks" Mobility   Outcome Measure  Help needed turning from your back to your side while in a flat bed without using bedrails?: A Little Help needed moving from lying on your back to sitting on the side of a flat bed without using bedrails?: A Lot Help needed moving to and from a bed to a chair (including a wheelchair)?: A Lot Help needed standing up from a chair using your arms (e.g., wheelchair or bedside chair)?: A Lot Help needed to walk in hospital room?: A Lot Help needed climbing 3-5 steps with a railing? : A Lot 6 Click Score: 13    End of Session Equipment Utilized During Treatment: Gait belt;Other (comment)(PRAFO boots) Activity Tolerance: Patient limited by pain(limited by pain in IV site RUE Wrist) Patient left: in bed;with call bell/phone within reach;with bed alarm set;with family/visitor present Nurse Communication: Mobility status PT Visit Diagnosis: Unsteadiness on feet (R26.81);Other abnormalities of gait and mobility (R26.89);Muscle weakness (generalized) (M62.81);Difficulty in walking, not  elsewhere classified (R26.2);Other symptoms and signs involving the nervous system (R29.898)     Time: 9326-7124 PT Time Calculation (min) (ACUTE ONLY): 46 min  Charges:  $Therapeutic Exercise: 8-22 mins $Therapeutic Activity: 23-37 mins                     Julaine Fusi PTA 08/07/19, 2:18 PM

## 2019-08-07 NOTE — Progress Notes (Addendum)
OT Cancellation Note  Patient Details Name: Lisa Williams MRN: [id number] DOB: 1960-09-17   Cancelled Treatment:    Reason Eval/Treat Not Completed: Pain limiting ability to participate;Other (comment). OT attempted to see pt for tx session this date. Upon arrival to pt room, pt resting comfortably in bed. Pt explains she has had multiple IV sticks this date 2/2 her original IV site becoming swollen. Pt now with new PIV on radial side distal to her wrist. Pt endorses this causes significant pain with mobility attempts. She endorses she is eager to participate in OT sessions, but would prefer to have session at a later time when the IV is less painful. Will hold OT tx at this time and re-attempt next date as available.   Rockney Ghee, M.S., OTR/L Ascom: [phone] 08/07/19, 2:58 PM

## 2019-08-07 NOTE — Progress Notes (Addendum)
Subjective: Continued LE weakness with some mild improvement noted from yesterday.  Was able to ambulate with therapy on yesterday.    Objective: Current vital signs: BP 112/61 (BP Location: Right Arm)   Pulse (!) 43   Temp 98.7 F (37.1 C)   Resp 18   Ht 5\' 11"  (1.803 m)   Wt 122.3 kg   SpO2 98%   BMI 37.62 kg/m  Vital signs in last 24 hours: Temp:  [97.6 F (36.4 C)-98.7 F (37.1 C)] 98.7 F (37.1 C) (04/01 0728) Pulse Rate:  [43-64] 43 (04/01 0728) Resp:  [12-20] 18 (04/01 0728) BP: (112-142)/(58-74) 112/61 (04/01 0728) SpO2:  [96 %-100 %] 98 % (04/01 0728) Weight:  [122.3 kg] 122.3 kg (04/01 0421)  Intake/Output from previous day: 03/31 0701 - 04/01 0700 In: 720 [P.O.:720] Out: 1350 [Urine:1350] Intake/Output this shift: Total I/O In: 240 [P.O.:240] Out: -  Nutritional status:  Diet Order            Diet Heart Room service appropriate? Yes; Fluid consistency: Thin  Diet effective now              Neurologic Exam: Mental Status: Alert, oriented, thought content appropriate.  Speech fluent without evidence of aphasia.  Able to follow 3 step commands without difficulty. Cranial Nerves: II: Visual fields grossly normal, pupils equal, round, reactive to light and accommodation III,IV, VI: ptosis not present, extra-ocular motions intact bilaterally V,VII: smile symmetric, facial light touch sensation normal bilaterally VIII: hearing normal bilaterally IX,X: gag reflex present XI: bilateral shoulder shrug XII: midline tongue extension Motor: 5/5 in the BUE's.  Trace bending at the knee noted on the right.  Able to lift legs a few inches off the bed.  Sensory: Pinprick and light touch intact throughout, bilaterally   Lab Results: Basic Metabolic Panel: Recent Labs  Lab 08/03/19 2046 08/04/19 0414 08/07/19 0450  NA 141 139 141  K 4.0 3.9 4.0  CL 110 112* 108  CO2 24 22 25   GLUCOSE 111* 95 133*  BUN 20 27* 28*  CREATININE 0.85 0.69 0.66  CALCIUM  9.2 8.5* 9.3    Liver Function Tests: Recent Labs  Lab 08/03/19 2046  AST 22  ALT 24  ALKPHOS 124  BILITOT 0.6  PROT 7.7  ALBUMIN 4.1   No results for input(s): LIPASE, AMYLASE in the last 168 hours. No results for input(s): AMMONIA in the last 168 hours.  CBC: Recent Labs  Lab 08/03/19 2046 08/04/19 0414 08/07/19 0450  WBC 8.4 6.7 14.3*  HGB 14.4 12.3 13.2  HCT 41.6 34.9* 38.0  MCV 76.2* 75.5* 75.7*  PLT 254 213 256    Cardiac Enzymes: No results for input(s): CKTOTAL, CKMB, CKMBINDEX, TROPONINI in the last 168 hours.  Lipid Panel: No results for input(s): CHOL, TRIG, HDL, CHOLHDL, VLDL, LDLCALC in the last 168 hours.  CBG: No results for input(s): GLUCAP in the last 168 hours.  Microbiology: Results for orders placed or performed during the hospital encounter of 08/03/19  SARS CORONAVIRUS 2 (TAT 6-24 HRS) Nasopharyngeal Nasopharyngeal Swab     Status: None   Collection Time: 08/03/19 10:50 PM   Specimen: Nasopharyngeal Swab  Result Value Ref Range Status   SARS Coronavirus 2 NEGATIVE NEGATIVE Final    Comment: (NOTE) SARS-CoV-2 target nucleic acids are NOT DETECTED. The SARS-CoV-2 RNA is generally detectable in upper and lower respiratory specimens during the acute phase of infection. Negative results do not preclude SARS-CoV-2 infection, do not rule out co-infections with  other pathogens, and should not be used as the sole basis for treatment or other patient management decisions. Negative results must be combined with clinical observations, patient history, and epidemiological information. The expected result is Negative. Fact Sheet for Patients: SugarRoll.be Fact Sheet for Healthcare Providers: https://www.woods-mathews.com/ This test is not yet approved or cleared by the [place] FDA and  has been authorized for detection and/or diagnosis of SARS-CoV-2 by FDA under an Emergency Use Authorization (EUA).  This EUA will remain  in effect (meaning this test can be used) for the duration of the COVID-19 declaration under Section 56 4(b)(1) of the Act, 21 U.S.C. section 360bbb-3(b)(1), unless the authorization is terminated or revoked sooner. Performed at [facility], [place] [address], [place], [place] [address]     Coagulation Studies: No results for input(s): LABPROT, INR in the last 72 hours.  Imaging: No results found.  Medications:  I have reviewed the patient's current medications. Scheduled: . baclofen  20 mg Oral TID  . cholecalciferol  2,000 Units Oral Daily  . enoxaparin (LOVENOX) injection  40 mg Subcutaneous Q24H  . famotidine  20 mg Oral BID  . gabapentin  100 mg Oral BID  . irbesartan  150 mg Oral Daily  . multivitamin with minerals   Oral Daily  . oxybutynin  10 mg Oral Daily  . sodium chloride flush  3 mL Intravenous Once    Assessment/Plan: 59 year old female with a history of MS presenting with BLE weakness. MS exacerbation suspected.Today is day #4 of 5 IV solumedrol. Some improved strength noted in the LE's.   Recommendations: 1. Solumedrol 1000mg  daily for 1 more day. No taper with oral steroids required after IV doses 2. GI prophylaxis 3. PT/OT.  Patient being evaluated for CIR.     LOS: 4 days   Lisa Goodell, MD Neurology [phone] 08/07/2019  10:39 AM

## 2019-08-07 NOTE — Progress Notes (Signed)
Inpatient Rehab Admissions Coordinator:   Received insurance authorization for CIR; however, I do not have a bed available for this patient today.  Will f/u in the AM for possible admission to CIR once last dose of IV solumedrol has been administered tomorrow.  Either myself, or my coworker Tresa Endo, will f/u tomorrow.   Estill Dooms, PT, DPT Admissions Coordinator [phone] 08/07/19  3:43 PM

## 2019-08-08 ENCOUNTER — Other Ambulatory Visit: Payer: Self-pay

## 2019-08-08 ENCOUNTER — Encounter (HOSPITAL_COMMUNITY): Payer: Self-pay | Admitting: Physical Medicine & Rehabilitation

## 2019-08-08 ENCOUNTER — Inpatient Hospital Stay (HOSPITAL_COMMUNITY)
Admission: RE | Admit: 2019-08-08 | Discharge: 2019-08-19 | DRG: 945 | Disposition: A | Discharge status: Home Health | Payer: PPO | Source: Intra-hospital | Attending: Physical Medicine & Rehabilitation | Admitting: Physical Medicine & Rehabilitation

## 2019-08-08 DIAGNOSIS — R5381 Other malaise: Principal | ICD-10-CM | POA: Diagnosis present

## 2019-08-08 DIAGNOSIS — N319 Neuromuscular dysfunction of bladder, unspecified: Secondary | ICD-10-CM | POA: Diagnosis not present

## 2019-08-08 DIAGNOSIS — Z720 Tobacco use: Secondary | ICD-10-CM | POA: Diagnosis not present

## 2019-08-08 DIAGNOSIS — E7800 Pure hypercholesterolemia, unspecified: Secondary | ICD-10-CM | POA: Diagnosis present

## 2019-08-08 DIAGNOSIS — G609 Hereditary and idiopathic neuropathy, unspecified: Secondary | ICD-10-CM | POA: Diagnosis present

## 2019-08-08 DIAGNOSIS — Z803 Family history of malignant neoplasm of breast: Secondary | ICD-10-CM | POA: Diagnosis not present

## 2019-08-08 DIAGNOSIS — R0989 Other specified symptoms and signs involving the circulatory and respiratory systems: Secondary | ICD-10-CM | POA: Diagnosis not present

## 2019-08-08 DIAGNOSIS — Z841 Family history of disorders of kidney and ureter: Secondary | ICD-10-CM | POA: Diagnosis not present

## 2019-08-08 DIAGNOSIS — Z83438 Family history of other disorder of lipoprotein metabolism and other lipidemia: Secondary | ICD-10-CM | POA: Diagnosis not present

## 2019-08-08 DIAGNOSIS — N3289 Other specified disorders of bladder: Secondary | ICD-10-CM

## 2019-08-08 DIAGNOSIS — E8809 Other disorders of plasma-protein metabolism, not elsewhere classified: Secondary | ICD-10-CM | POA: Diagnosis not present

## 2019-08-08 DIAGNOSIS — Z79899 Other long term (current) drug therapy: Secondary | ICD-10-CM

## 2019-08-08 DIAGNOSIS — I10 Essential (primary) hypertension: Secondary | ICD-10-CM | POA: Diagnosis present

## 2019-08-08 DIAGNOSIS — Z833 Family history of diabetes mellitus: Secondary | ICD-10-CM

## 2019-08-08 DIAGNOSIS — Z7983 Long term (current) use of bisphosphonates: Secondary | ICD-10-CM

## 2019-08-08 DIAGNOSIS — M792 Neuralgia and neuritis, unspecified: Secondary | ICD-10-CM

## 2019-08-08 DIAGNOSIS — M62838 Other muscle spasm: Secondary | ICD-10-CM | POA: Diagnosis not present

## 2019-08-08 DIAGNOSIS — Z832 Family history of diseases of the blood and blood-forming organs and certain disorders involving the immune mechanism: Secondary | ICD-10-CM

## 2019-08-08 DIAGNOSIS — G8929 Other chronic pain: Secondary | ICD-10-CM | POA: Diagnosis present

## 2019-08-08 DIAGNOSIS — G35 Multiple sclerosis: Secondary | ICD-10-CM | POA: Diagnosis present

## 2019-08-08 DIAGNOSIS — K592 Neurogenic bowel, not elsewhere classified: Secondary | ICD-10-CM

## 2019-08-08 DIAGNOSIS — Z8041 Family history of malignant neoplasm of ovary: Secondary | ICD-10-CM

## 2019-08-08 DIAGNOSIS — Z8042 Family history of malignant neoplasm of prostate: Secondary | ICD-10-CM

## 2019-08-08 DIAGNOSIS — Z800 Family history of malignant neoplasm of digestive organs: Secondary | ICD-10-CM

## 2019-08-08 DIAGNOSIS — R001 Bradycardia, unspecified: Secondary | ICD-10-CM

## 2019-08-08 DIAGNOSIS — M549 Dorsalgia, unspecified: Secondary | ICD-10-CM | POA: Diagnosis present

## 2019-08-08 DIAGNOSIS — E46 Unspecified protein-calorie malnutrition: Secondary | ICD-10-CM | POA: Diagnosis not present

## 2019-08-08 DIAGNOSIS — Z8249 Family history of ischemic heart disease and other diseases of the circulatory system: Secondary | ICD-10-CM

## 2019-08-08 DIAGNOSIS — Z888 Allergy status to other drugs, medicaments and biological substances status: Secondary | ICD-10-CM

## 2019-08-08 LAB — CBC
HCT: 36.3 % (ref 36.0–46.0)
HCT: 42.8 % (ref 36.0–46.0)
Hemoglobin: 12.8 g/dL (ref 12.0–15.0)
Hemoglobin: 14.7 g/dL (ref 12.0–15.0)
MCH: 26.2 pg (ref 26.0–34.0)
MCH: 26.2 pg (ref 26.0–34.0)
MCHC: 35.3 g/dL (ref 30.0–36.0)
MCHC: 34.3 g/dL (ref 30.0–36.0)
MCV: 74.2 fL — ABNORMAL LOW (ref 80.0–100.0)
MCV: 76.2 fL — ABNORMAL LOW (ref 80.0–100.0)
Platelets: 236 10*3/uL (ref 150–400)
Platelets: 264 10*3/uL (ref 150–400)
RBC: 4.89 MIL/uL (ref 3.87–5.11)
RBC: 5.62 MIL/uL — ABNORMAL HIGH (ref 3.87–5.11)
RDW: 13.9 % (ref 11.5–15.5)
RDW: 14.2 % (ref 11.5–15.5)
WBC: 12.7 10*3/uL — ABNORMAL HIGH (ref 4.0–10.5)
WBC: 12.8 10*3/uL — ABNORMAL HIGH (ref 4.0–10.5)
nRBC: 0 % (ref 0.0–0.2)
nRBC: 0 % (ref 0.0–0.2)

## 2019-08-08 LAB — CREATININE, SERUM
Creatinine, Ser: 0.89 mg/dL (ref 0.44–1.00)
GFR calc Af Amer: >60 mL/min (ref >60)
GFR calc non Af Amer: >60 mL/min (ref >60)

## 2019-08-08 LAB — BASIC METABOLIC PANEL
Anion gap: 8 (ref 5–15)
BUN: 30 mg/dL — ABNORMAL HIGH (ref 6–20)
CO2: 25 mmol/L (ref 22–32)
Calcium: 8.7 mg/dL — ABNORMAL LOW (ref 8.9–10.3)
Chloride: 107 mmol/L (ref 98–111)
Creatinine, Ser: 0.75 mg/dL (ref 0.44–1.00)
GFR calc Af Amer: >60 mL/min (ref >60)
GFR calc non Af Amer: >60 mL/min (ref >60)
Glucose, Bld: 126 mg/dL — ABNORMAL HIGH (ref 70–99)
Potassium: 4.5 mmol/L (ref 3.5–5.1)
Sodium: 140 mmol/L (ref 135–145)

## 2019-08-08 MED ORDER — ENOXAPARIN SODIUM 40 MG/0.4ML ~~LOC~~ SOLN: 40.0000 mg | SUBCUTANEOUS | Status: DC

## 2019-08-08 MED ORDER — ADULT MULTIVITAMIN W/MINERALS CH
1.0000 | ORAL_TABLET | Freq: Every day | ORAL | Status: DC
  Administered 2019-08-19: 08:00:00 1 via ORAL
  Filled 2019-08-08 (×10): qty 1

## 2019-08-08 MED ORDER — FAMOTIDINE 20 MG PO TABS
20.0000 mg | ORAL_TABLET | Freq: Two times a day (BID) | ORAL | Status: DC
  Administered 2019-08-11 – 2019-08-19 (×14): 20 mg via ORAL
  Filled 2019-08-08 (×23): qty 1

## 2019-08-08 MED ORDER — ONDANSETRON HCL 4 MG PO TABS: 4.0000 mg | ORAL_TABLET | Freq: Four times a day (QID) | ORAL | Status: DC | PRN

## 2019-08-08 MED ORDER — ONDANSETRON HCL 4 MG/2ML IJ SOLN: 4.0000 mg | Freq: Four times a day (QID) | INTRAMUSCULAR | Status: DC | PRN

## 2019-08-08 MED ORDER — VITAMIN D 25 MCG (1000 UNIT) PO TABS
2000.0000 [IU] | ORAL_TABLET | Freq: Every day | ORAL | Status: DC
  Administered 2019-08-09 – 2019-08-19 (×11): 2000 [IU] via ORAL
  Filled 2019-08-08 (×13): qty 2

## 2019-08-08 MED ORDER — BACLOFEN 20 MG PO TABS
20.0000 mg | ORAL_TABLET | Freq: Three times a day (TID) | ORAL | Status: DC
  Administered 2019-08-08 – 2019-08-19 (×33): 20 mg via ORAL
  Filled 2019-08-08 (×33): qty 1

## 2019-08-08 MED ORDER — IRBESARTAN 300 MG PO TABS
150.0000 mg | ORAL_TABLET | Freq: Every day | ORAL | Status: DC
  Administered 2019-08-09 – 2019-08-13 (×5): 150 mg via ORAL
  Filled 2019-08-08 (×5): qty 1

## 2019-08-08 MED ORDER — OXYBUTYNIN CHLORIDE ER 10 MG PO TB24
10.0000 mg | ORAL_TABLET | Freq: Every day | ORAL | Status: DC
  Administered 2019-08-09 – 2019-08-19 (×11): 10 mg via ORAL
  Filled 2019-08-08 (×11): qty 1

## 2019-08-08 MED ORDER — ENOXAPARIN SODIUM 40 MG/0.4ML ~~LOC~~ SOLN
40.0000 mg | SUBCUTANEOUS | Status: DC
  Administered 2019-08-08 – 2019-08-18 (×11): 40 mg via SUBCUTANEOUS
  Filled 2019-08-08 (×11): qty 0.4

## 2019-08-08 MED ORDER — GABAPENTIN 100 MG PO CAPS
100.0000 mg | ORAL_CAPSULE | Freq: Two times a day (BID) | ORAL | Status: DC
  Administered 2019-08-08 – 2019-08-19 (×22): 100 mg via ORAL
  Filled 2019-08-08 (×22): qty 1

## 2019-08-08 MED ORDER — SORBITOL 70 % SOLN
30.0000 mL | Freq: Every day | Status: DC | PRN
  Administered 2019-08-13: 17:00:00 30 mL via ORAL
  Filled 2019-08-08: qty 30

## 2019-08-08 NOTE — Progress Notes (Signed)
Subjective: Patient reports continued improvement.  To go to CIR today.    Objective: Current vital signs: BP 129/63 (BP Location: Left Arm)   Pulse 60   Temp 97.9 F (36.6 C) (Oral)   Resp 18   Ht 5\' 11"  (1.803 m)   Wt 122.3 kg   SpO2 97%   BMI 37.62 kg/m  Vital signs in last 24 hours: Temp:  [97.9 F (36.6 C)-98.6 F (37 C)] 97.9 F (36.6 C) (04/02 0359) Pulse Rate:  [50-60] 60 (04/02 0359) Resp:  [16-18] 18 (04/02 0359) BP: (128-141)/(51-67) 129/63 (04/02 0359) SpO2:  [97 %-98 %] 97 % (04/02 0359)  Intake/Output from previous day: 04/01 0701 - 04/02 0700 In: 480 [P.O.:480] Out: 0  Intake/Output this shift: No intake/output data recorded. Nutritional status:  Diet Order            Diet Heart Room service appropriate? Yes; Fluid consistency: Thin  Diet effective now              Neurologic Exam: Mental Status: Alert, oriented, thought content appropriate. Speech fluent without evidence of aphasia. Able to follow 3 step commands without difficulty. Cranial Nerves: II: Visual fields grossly normal, pupils equal, round, reactive to light and accommodation III,IV, VI: ptosis not present, extra-ocular motions intact bilaterally V,VII: smile symmetric, facial light touch sensation normal bilaterally VIII: hearing normal bilaterally IX,X: gag reflex present XI: bilateral shoulder shrug XII: midline tongue extension Motor: 5/5 in the BUE's. Trace bending at the knee noted bilaterally. Able to lift legs more inches off the bed.  Sensory: Pinprick and light touch intact throughout, bilaterally   Lab Results: Basic Metabolic Panel: Recent Labs  Lab 08/03/19 2046 08/03/19 2046 08/04/19 0414 08/07/19 0450 08/08/19 0452  NA 141  --  139 141 140  K 4.0  --  3.9 4.0 4.5  CL 110  --  112* 108 107  CO2 24  --  22 25 25   GLUCOSE 111*  --  95 133* 126*  BUN 20  --  27* 28* 30*  CREATININE 0.85  --  0.69 0.66 0.75  CALCIUM 9.2   < > 8.5* 9.3 8.7*   < > =  values in this interval not displayed.    Liver Function Tests: Recent Labs  Lab 08/03/19 2046  AST 22  ALT 24  ALKPHOS 124  BILITOT 0.6  PROT 7.7  ALBUMIN 4.1   No results for input(s): LIPASE, AMYLASE in the last 168 hours. No results for input(s): AMMONIA in the last 168 hours.  CBC: Recent Labs  Lab 08/03/19 2046 08/04/19 0414 08/07/19 0450 08/08/19 0452  WBC 8.4 6.7 14.3* 12.7*  HGB 14.4 12.3 13.2 12.8  HCT 41.6 34.9* 38.0 36.3  MCV 76.2* 75.5* 75.7* 74.2*  PLT 254 213 256 236    Cardiac Enzymes: No results for input(s): CKTOTAL, CKMB, CKMBINDEX, TROPONINI in the last 168 hours.  Lipid Panel: No results for input(s): CHOL, TRIG, HDL, CHOLHDL, VLDL, LDLCALC in the last 168 hours.  CBG: No results for input(s): GLUCAP in the last 168 hours.  Microbiology: Results for orders placed or performed during the hospital encounter of 08/03/19  SARS CORONAVIRUS 2 (TAT 6-24 HRS) Nasopharyngeal Nasopharyngeal Swab     Status: None   Collection Time: 08/03/19 10:50 PM   Specimen: Nasopharyngeal Swab  Result Value Ref Range Status   SARS Coronavirus 2 NEGATIVE NEGATIVE Final    Comment: (NOTE) SARS-CoV-2 target nucleic acids are NOT DETECTED. The SARS-CoV-2 RNA is  generally detectable in upper and lower respiratory specimens during the acute phase of infection. Negative results do not preclude SARS-CoV-2 infection, do not rule out co-infections with other pathogens, and should not be used as the sole basis for treatment or other patient management decisions. Negative results must be combined with clinical observations, patient history, and epidemiological information. The expected result is Negative. Fact Sheet for Patients: HairSlick.no Fact Sheet for Healthcare Providers: quierodirigir.com This test is not yet approved or cleared by the [place] FDA and  has been authorized for detection and/or diagnosis  of SARS-CoV-2 by FDA under an Emergency Use Authorization (EUA). This EUA will remain  in effect (meaning this test can be used) for the duration of the COVID-19 declaration under Section 56 4(b)(1) of the Act, 21 U.S.C. section 360bbb-3(b)(1), unless the authorization is terminated or revoked sooner. Performed at [facility], [address] [place], [place] [address]     Coagulation Studies: No results for input(s): LABPROT, INR in the last 72 hours.  Imaging: No results found.  Medications:  I have reviewed the patient's current medications. Scheduled: . baclofen  20 mg Oral TID  . cholecalciferol  2,000 Units Oral Daily  . enoxaparin (LOVENOX) injection  40 mg Subcutaneous Q24H  . famotidine  20 mg Oral BID  . gabapentin  100 mg Oral BID  . irbesartan  150 mg Oral Daily  . multivitamin with minerals   Oral Daily  . oxybutynin  10 mg Oral Daily  . sodium chloride flush  3 mL Intravenous Once    Assessment/Plan: 59 year old female with a history of MS presenting with BLE weakness. MS exacerbation suspected.Today is last day of IV solumedrol. Someimproved strength noted in the LE's.   Recommendations: 1. No taper with oral steroids required after IV doses 2. Patient stable for CIR transfer   LOS: 5 days   Thana Farr, MD Neurology [phone] 08/08/2019  9:06 AM

## 2019-08-08 NOTE — Progress Notes (Signed)
Patient arrived on unit, oriented to unit. Reviewed medicines, therapy schedule, rehab routine and plan of care. States an understanding of information reviewed. Lisa Williams L [name]  

## 2019-08-08 NOTE — Progress Notes (Signed)
Ch visited with Pt sitting up in bed. Pt was in high spirits, "I am excited that I am going to go to in-patient rehab at [facility]" I look forward to therapy working for me. Pt told EVS staff in the room that she is going to [facility] for 7-10 days. Pt requested prayer for health to get better. Ch prayed with Pt. Pt was grateful, and was excited to be discharged.

## 2019-08-08 NOTE — Progress Notes (Signed)
Offered pt assistance with bath at this time and pt declined.

## 2019-08-08 NOTE — Progress Notes (Signed)
OT Cancellation Note  Patient Details Name: Lisa Williams MRN: [id number] DOB: 12-23-1960   Cancelled Treatment:    Reason Eval/Treat Not Completed: Other (comment)   Patient declines treatment stating she will be discharging shortly.  Patient with no questions or concerns.  Will follow up as appropriate and available.  Thank you.  Wynn Maudlin 08/08/2019, 11:33 AM

## 2019-08-08 NOTE — Progress Notes (Addendum)
Ranelle Oyster, MD  Physician  Physical Medicine and Rehabilitation  PMR Pre-admission     Signed  Date of Service:  08/06/2019  3:34 PM      Related encounter: ED to Hosp-Admission (Discharged) from 08/03/2019 in [facility] TELEMETRY (2A)      Signed        PMR Admission Coordinator Pre-Admission Assessment   Patient: Lisa Williams is an 59 y.o., female MRN: [id number] DOB: 20-Apr-1961 Height: 5\' 11"  (180.3 cm) Weight: 123.1 kg   Insurance Information HMO:     PPO: yes     PCP:      IPA:      80/20:      OTHER:  PRIMARY: [employer]      Policy#: [id number]      Subscriber: pt CM Name: Courtney Paris.      Phone#: [phone]     Fax#: [phone] Pre-Cert#: [id number]      Employer:  [employer] ([id number]) provided by [name] with [facility] for admit to CIR. Pt is approved for 7 days. They have epic access for clinical updates. [name] phone number for questions (p): [phone] Benefits:  Phone #: [phone]     Name: 3/31 Eff. Date: 05/09/2019     Deduct: none      Out of Pocket Max: $3400      Life Max: none CIR: $295 co pay per day days 1 until 6      SNF: no copay days 1 until 20; $175 co pay per day days 21 until 100 Outpatient: $15 per visit     Co-Pay: visits per medical neccesity Home Health: 100%      Co-Pay: visits per medical neccesity DME: 80%     Co-Pay: 205 Providers: in network  SECONDARY: none      Medicaid Application Date:       Case Manager:  Disability Application Date:       Case Worker:    The "Data Collection Information Summary" for patients in Inpatient Rehabilitation Facilities with attached "Privacy Act Statement-Health Care Records" was provided and verbally reviewed with: Patient   Emergency Contact Information         Contact Information     Name Relation Home Work Mobile    Alred,Sheila Sister [phone]      Lisowski,Sharon Sister [phone]      Peggyann Juba  [phone]        Mair,Margaret Mother [phone]             Current Medical History  Patient Admitting Diagnosis:  MS exacerbation   History of Present Illness: 59 year old female with known history of multiple sclerosis, presented 08/03/2019 with onset of bilateral lower extremity weakness with decreased sensations that began at 3 pm. Patient had her second COVID vaccine the day before. Resembled last MS flare that occurred December 2019.    Neurology consulted with recommendation for Solumedrol iv for 3 days GI prophylaxis as well as PT And OT evals with suspected MS exacerbation. Noted improvements in LES and 2 additional days of solumedrol recommended for total of 5 days.    Patient's medical record from [facility]  has been reviewed by the rehabilitation admission coordinator and physician.   Past Medical History      Past Medical History:  Diagnosis Date  . Abdominal pain, right upper quadrant    . Back pain    . Calculus of kidney 12/09/2013  . Chronic back  pain      unspecified  . Chronic left shoulder pain 07/19/2015  . Functional disorder of bladder      other  . Galactorrhea 11/26/2014    Chronic   . Hereditary and idiopathic neuropathy 08/19/2013  . HPV test positive    . Hypercholesteremia 08/19/2013  . Incomplete bladder emptying    . Microscopic hematuria    . MS (multiple sclerosis) (HCC)    . Muscle spasticity 05/21/2014  . Nonspecific findings on examination of urine      other  . Osteopenia    . Status post laparoscopic supracervical hysterectomy 11/26/2014  . Tobacco user 11/26/2014  . Wrist fracture        Family History   family history includes Breast cancer in her maternal aunt; Diabetes in her maternal aunt and maternal grandmother; Heart failure in her father; Hyperlipidemia in her mother; Hypertension in her father, mother, sister, sister, and sister; Kidney disease in her father; Liver cancer in her maternal grandmother;  Nephrolithiasis in her father; Osteoarthritis in her mother; Ovarian cancer in her paternal grandmother; Prostate cancer in her father; Sickle cell trait in her sister; Supraventricular tachycardia in her sister.   Prior Rehab/Hospitalizations Has the patient had prior rehab or hospitalizations prior to admission? Yes   Has the patient had major surgery during 100 days prior to admission? No               Current Medications   Current Facility-Administered Medications:  .  acetaminophen (TYLENOL) tablet 1,000 mg, 1,000 mg, Oral, Q6H PRN, Manuela Schwartz, NP, 1,000 mg at 08/05/19 2031 .  baclofen (LIORESAL) tablet 20 mg, 20 mg, Oral, TID, Mansy, Jan A, MD, 20 mg at 08/06/19 1439 .  cholecalciferol (VITAMIN D3) tablet 2,000 Units, 2,000 Units, Oral, Daily, Mansy, Vernetta Honey, MD, 2,000 Units at 08/06/19 [phone] .  enoxaparin (LOVENOX) injection 40 mg, 40 mg, Subcutaneous, Q24H, Mansy, Jan A, MD, 40 mg at 08/05/19 2129 .  gabapentin (NEURONTIN) capsule 100 mg, 100 mg, Oral, BID, Charise Killian, MD, 100 mg at 08/06/19 0840 .  irbesartan (AVAPRO) tablet 150 mg, 150 mg, Oral, Daily, Charise Killian, MD, 150 mg at 08/06/19 0840 .  magnesium hydroxide (MILK OF MAGNESIA) suspension 30 mL, 30 mL, Oral, Daily PRN, Mansy, Jan A, MD .  Melene Muller ON 08/07/2019] methylPREDNISolone sodium succinate (SOLU-MEDROL) 1,000 mg in sodium chloride 0.9 % 50 mL IVPB, 1,000 mg, Intravenous, Daily, Thana Farr, MD .  multivitamin with minerals tablet, , Oral, Daily, Mansy, Vernetta Honey, MD, 1 tablet at 08/06/19 0840 .  ondansetron (ZOFRAN) tablet 4 mg, 4 mg, Oral, Q6H PRN **OR** ondansetron (ZOFRAN) injection 4 mg, 4 mg, Intravenous, Q6H PRN, Mansy, Jan A, MD .  oxybutynin (DITROPAN-XL) 24 hr tablet 10 mg, 10 mg, Oral, Daily, Mansy, Jan A, MD, 10 mg at 08/06/19 0840 .  sodium chloride flush (NS) 0.9 % injection 3 mL, 3 mL, Intravenous, Once, Monks, Sarah L., MD .  traZODone (DESYREL) tablet 25 mg, 25 mg, Oral, QHS PRN, Mansy,  Vernetta Honey, MD   Patients Current Diet:     Diet Order                      Diet Heart Room service appropriate? Yes; Fluid consistency: Thin  Diet effective now                   Precautions / Restrictions Precautions Precautions: Fall Precaution Comments: MS Restrictions Weight Bearing  Restrictions: No    Has the patient had 2 or more falls or a fall with injury in the past year? No   Prior Activity Level Limited Community (1-2x/wk): outpatietn therapy; drives with hand controls   Sponge bathes for tub bench felt unsafe. Uses RW in home. Drives self to outpatient therapy using hand controlled vehicle. Doe shave manual and motorized wheelchairs that have not used very recently.    Prior Functional Level Self Care: Did the patient need help bathing, dressing, using the toilet or eating? Independent sponge bathes due to tub bench felt unsafe   Indoor Mobility: Did the patient need assistance with walking from room to room (with or without device)? Independent   Stairs: Did the patient need assistance with internal or external stairs (with or without device)? Independent   Functional Cognition: Did the patient need help planning regular tasks such as shopping or remembering to take medications? Independent   Home Assistive Devices / Equipment Home Assistive Devices/Equipment: Environmental consultant (specify type), Wheelchair, Hospital bed, Eyeglasses, Bedside commode/3-in-1 Home Equipment: Dan Humphreys - 2 wheels, Grab bars - tub/shower, Grab bars - toilet, Wheelchair - manual, Shower seat - built in, [employer] held shower head   Prior Device Use: Indicate devices/aids used by the patient prior to current illness, exacerbation or injury? Walker   Current Functional Level Cognition   Overall Cognitive Status: Within Functional Limits for tasks assessed Orientation Level: Oriented X4 General Comments: Pt is A and O x 4 and very motivated.     Extremity Assessment (includes Sensation/Coordination)    Upper Extremity Assessment: Generalized weakness(BUE grossly 4-/5. Grip/FMC WFLs.)  Lower Extremity Assessment: Generalized weakness, Defer to PT evaluation     ADLs   Overall ADL's : Needs assistance/impaired General ADL Comments: Pt reports she is independent for all BADL management at baseline. She uses a Manual WC for fxl mobility on "bad days" and using a 2WW for mobility on good days. She uses a hospital bed, long handle reacher, and hand held showerhead as additional support for baseline function. Pt currently requires moderate to max assist for LB ADL management including dressing and bathing. She requires max assist to don/dof hospital socks at bed level this date. She demonstrates limited ability to perform SLR to assist.     Mobility   Overal bed mobility: Needs Assistance Bed Mobility: Supine to Sit, Sit to Supine Rolling: Min guard Supine to sit: HOB elevated, Min guard Sit to supine: +2 for physical assistance, Min assist, HOB elevated General bed mobility comments: Pt was able to progress from long sitting to short sit with CGA. increased time to perform with use of bedrails. She did however require +2 min assist to return to long sitting in bed after OOB activity. One therapist assist BLEs into bed while 2nd person controlled upper body decent.     Transfers   Overall transfer level: Needs assistance Equipment used: Rolling walker (2 wheeled) Transfers: Sit to/from Stand Sit to Stand: +2 safety/equipment, +2 physical assistance, Min assist  Lateral/Scoot Transfers: Supervision, Min guard General transfer comment: Pt was able to stand EOB 3 x throughout session with +2 min assist for safety. Bed height was elevated and vcs throughout for technique. Once standing, pt require only CGA for static standing balance but min assist for dynamic/ambulation     Ambulation / Gait / Stairs / Wheelchair Mobility   Ambulation/Gait Ambulation/Gait assistance: Min assist, +2  safety/equipment Gait Distance (Feet): 50 Feet Assistive device: Rolling walker (2 wheeled) Gait Pattern/deviations: Decreased  step length - right, Wide base of support, Decreased step length - left General Gait Details: Pt was able to tolerated ambulation ~ 50 ft with RW + w/c follow for safety. HR elevated to 151 bpm but quickly resolves to 90s after only 45 sec seated rest. PT requires increased time to ambulate with BLE AFOs donned. She has to ambulate with severe klateral wt shift to allow opposite LE advancement. Min assist throughout for safety. Pt very fatigued post ambulation Gait velocity: decreased     Posture / Balance Dynamic Sitting Balance Sitting balance - Comments: Steady static and dynamic sitting w/o LOB noted. Occasional use of 1 UE support  when performing dynamic sitting balance tasks. Balance Overall balance assessment: Needs assistance Sitting-balance support: Feet supported, No upper extremity supported Sitting balance-Leahy Scale: Fair Sitting balance - Comments: Steady static and dynamic sitting w/o LOB noted. Occasional use of 1 UE support  when performing dynamic sitting balance tasks. Postural control: Posterior lean Standing balance support: Bilateral upper extremity supported Standing balance-Leahy Scale: Fair Standing balance comment: Heavy BUE use on RW when standing. Able to weight shift with BUE support/mod A for balance.     Special needs/care consideration BiPAP/CPAP  N/a CPM  N/a Continuous Drip IV  N/a Dialysis n/a Life Vest  N/a Oxygen  N/a Special Bed  N/a Trach Size  N/a Wound Vac n/a Skin  intact Bowel mgmt: continent, last BM: 08/06/19 Bladder mgmt:  continent Diabetic mgmt:  N/a Behavioral consideration  N/a Chemo/radiation n/a Designated visitor is : (she will determine once she gets to CIR per her request).     Previous Home Environment  Living Arrangements: Other relatives(lives with sister and Mother)  Lives With: Other  (Comment)(sister, [name] and mother) Available Help at Discharge: Family, Available 24 hours/day Type of Home: House Home Layout: Two level, Able to live on main level with bedroom/bathroom Home Access: Ramped entrance Bathroom Shower/Tub: Engineer, manufacturing systems: Standard Bathroom Accessibility: Yes How Accessible: Accessible via walker Home Care Services: No Additional Comments: Pt denies falls hx in past year   Discharge Living Setting Plans for Discharge Living Setting: Lives with (comment)(sister, [facility] and her elderly Mom) Type of Home at Discharge: House Discharge Home Layout: Able to live on main level with bedroom/bathroom, Two level Discharge Home Access: Ramped entrance Discharge Bathroom Shower/Tub: Tub/shower unit Discharge Bathroom Toilet: Standard(with 3 n 1 over toliet) Discharge Bathroom Accessibility: Yes How Accessible: Accessible via walker Does the patient have any problems obtaining your medications?: No   Social/Family/Support Systems Contact Information: sister, [name] Anticipated Caregiver: sister Anticipated Caregiver's Contact Information: see above Ability/Limitations of Caregiver: sister works days; Mom has early dementia Caregiver Availability: Evenings only Discharge Plan Discussed with Primary Caregiver: Yes Is Caregiver In Agreement with Plan?: Yes Does Caregiver/Family have Issues with Lodging/Transportation while Pt is in Rehab?: No   Goals/Additional Needs Patient/Family Goal for Rehab: Mod I to supervision with PT and OT Expected length of stay: ELOS 7 to 10 days Equipment Needs: Patietn states tub bench at home not felt safe therefore she sponge bathes Pt/Family Agrees to Admission and willing to participate: Yes Program Orientation Provided & Reviewed with Pt/Caregiver Including Roles  & Responsibilities: Yes  Barriers to Discharge: Decreased caregiver support   Decrease burden of Care through IP rehab admission: n/a    Possible need for SNF placement upon discharge: n/a   Patient Condition: I have reviewed medical records from [facility], spoken with CM, and patient. I discussed via  phone for inpatient rehabilitation assessment.  Patient will benefit from ongoing PT and OT, can actively participate in 3 hours of therapy a day 5 days of the week, and can make measurable gains during the admission.  Patient will also benefit from the coordinated team approach during an Inpatient Acute Rehabilitation admission.  The patient will receive intensive therapy as well as Rehabilitation physician, nursing, social worker, and care management interventions.  Due to bladder management, bowel management, safety, skin/wound care, disease management, medication administration, pain management and patient education the patient requires 24 hour a day rehabilitation nursing.  The patient is currently min assist with mobility and basic ADLs.  Discharge setting and therapy post discharge at home with outpatient is anticipated.  Patient has agreed to participate in the Acute Inpatient Rehabilitation Program and will admit 08/08/19.   Preadmission Screen Completed By: Danne Baxter, RN MSN 08/06/2019 3:34 PM with brief updates by Raechel Ache, OTR/L on day of admit.  ______________________________________________________________________   Discussed status with Dr. Naaman Plummer on 08/08/19 at 10:24AM and received approval for admission today.   Admission Coordinator: Danne Baxter, RN MSN  Julious Payer, Audelia Acton, RN, with brief updates on day of admit by Raechel Ache OTR/L at time 10:25AM/Date 08/08/19    Assessment/Plan: Diagnosis: MS exacerbation 1. Does the need for close, 24 hr/day Medical supervision in concert with the patient's rehab needs make it unreasonable for this patient to be served in a less intensive setting? Yes 2. Co-Morbidities requiring supervision/potential complications: spasticity, chronic pain 3. Due  to bladder management, bowel management, safety, skin/wound care, disease management, medication administration, pain management and patient education, does the patient require 24 hr/day rehab nursing? Yes 4. Does the patient require coordinated care of a physician, rehab nurse, PT and OT to address physical and functional deficits in the context of the above medical diagnosis(es)? Yes Addressing deficits in the following areas: balance, endurance, locomotion, strength, transferring, bowel/bladder control, bathing, dressing, feeding, grooming, toileting and psychosocial support 5. Can the patient actively participate in an intensive therapy program of at least 3 hrs of therapy 5 days a week? Yes 6. The potential for patient to make measurable gains while on inpatient rehab is excellent 7. Anticipated functional outcomes upon discharge from inpatient rehab: modified independent and supervision PT, modified independent and supervision OT, n/a SLP 8. Estimated rehab length of stay to reach the above functional goals is: 7-11 days 9. Anticipated discharge destination: Home 10. Overall Rehab/Functional Prognosis: excellent     MD Signature: Meredith Staggers, MD, [facility] 08/08/2019         Revision History

## 2019-08-08 NOTE — Discharge Instructions (Signed)
OK for regular diet post discharge once at [facility]

## 2019-08-08 NOTE — Discharge Summary (Signed)
Physician Discharge Summary  Lisa Williams JTT:[id number] DOB: [birth year] DOA: 08/03/2019  PCP: Lauro Regulus, MD  Admit date: 08/03/2019 Discharge date: 08/08/2019  Admitted From: Home Disposition:  CIR  Recommendations for Outpatient Follow-up:  1. Follow up with PCP in 1-2 weeks 2. Follow-up with neurology as directed  Home Health: None Equipment/Devices: None Discharge Condition: Stable CODE STATUS: Full Diet recommendation: Regular  Brief/Interim Summary:59 y.o. African-American female with a known history of multiple sclerosis, who presented to the emergency room with the onset of bilateral lower extremity weakness with decreased sensations that started today at 3 PM.  She had her second Covid vaccine yesterday around 12 noon.  She denies any fever or chills.  No nausea or vomiting or abdominal pain or diarrhea.  No headache or dizziness or blurred vision.  No arm soreness or redness or induration or swelling.  No rashes or worsening skin redness or induration.  She stated that this is exactly like her last MS flare that occurred in December 2019.  Upon presentation to the emergency room, blood pressure was 157/87 with a pulse of 101 respiratory rate 18 and temperature 98.8 with pulse currently 97% on room air.  Labs revealed unremarkable CMP and CBC.  The patient was given 1 g of p.o. Tylenol.  Contact was made with Dr. Amada Jupiter with neurology on-call at [facility].  The recommendation was for holding off on steroids and having the patient be evaluated by her primary neurologist first.  She will be admitted to medical monitored bed for further evaluation and management.  4/2: Patient completed high-dose Solu-Medrol burst in house.  Received last dose on the day of discharge.  Evaluated by neurology.  Stable for discharge to inpatient rehab.  No oral prednisone taper necessary.  Patient stable for discharge to CIR   Discharge Diagnoses:  Active  Problems:   Multiple sclerosis exacerbation (HCC)  Multiple sclerosis exacerbation: relapsing/remitting.  W/ bilateral lower extremity weakness and decreased light touchsensation which has improved Status post 5 days of Solu-Medrol 1000 mg daily Neurology following Stable for discharge Continue home dose of baclofen, gabapentin  Hypertension: continue irbesartan   Incomplete bladder emptying: will continue oxybutynin   Sinus bradycardia: chronic as per pt. Asymptomatic. Not on any BB or CCB. Will continue to monitor on tele   Discharge Instructions  Discharge Instructions    Diet general   Complete by: As directed    Regular diet ok   Increase activity slowly   Complete by: As directed      Allergies as of 08/08/2019      Reactions   Amlodipine Other (See Comments)   edema   Povidone Iodine Rash      Medication List    TAKE these medications   baclofen 20 MG tablet Commonly known as: LIORESAL Take 20 mg by mouth 3 (three) times daily.   EQL Vitamin D3 25 MCG (1000 UT) tablet Generic drug: Cholecalciferol Take 2,000 Units by mouth daily.   gabapentin 100 MG capsule Commonly known as: NEURONTIN Take 100 mg by mouth 2 (two) times daily.   ibandronate 150 MG tablet Commonly known as: BONIVA TAKE 1 TABLET BY MOUTH EVERY 30 DAYS FIRST THING IN THE MORNING WITH A FULL GLASS OF WATER. DO NOT EAT, DRINK OR LIE DOWN FOR 60 MINUTES AFTER   oxybutynin 10 MG 24 hr tablet Commonly known as: DITROPAN-XL Take 1 tablet (10 mg total) by mouth daily.   oxybutynin 5 MG tablet Commonly known as:  DITROPAN TAKE 1 TABLET BY MOUTH TWICE A DAY   telmisartan 40 MG tablet Commonly known as: MICARDIS Take 40 mg by mouth daily.       Allergies  Allergen Reactions  . Amlodipine Other (See Comments)    edema  . Povidone Iodine Rash    Consultations:  Neurology-[facility]   Procedures/Studies:  No results found. (Echo, Carotid, EGD, Colonoscopy, ERCP)     Subjective: Seen and examined on the day of discharge No complaints, feels well Medically stable for discharge to CIR  Discharge Exam: Vitals:   08/07/19 2000 08/08/19 0359  BP: (!) 141/67 129/63  Pulse: (!) 53 60  Resp: 17 18  Temp: 98.6 F (37 C) 97.9 F (36.6 C)  SpO2: 98% 97%   Vitals:   08/07/19 0728 08/07/19 1152 08/07/19 2000 08/08/19 0359  BP: 112/61 (!) 128/51 (!) 141/67 129/63  Pulse: (!) 43 (!) 50 (!) 53 60  Resp: 18 16 17 18   Temp: 98.7 F (37.1 C) 98.2 F (36.8 C) 98.6 F (37 C) 97.9 F (36.6 C)  TempSrc:   Oral Oral  SpO2: 98% 98% 98% 97%  Weight:      Height:        General: Pt is alert, awake, not in acute distress Cardiovascular: RRR, S1/S2 +, no rubs, no gallops Respiratory: CTA bilaterally, no wheezing, no rhonchi Abdominal: Soft, NT, ND, bowel sounds + Extremities: no edema, no cyanosis    The results of significant diagnostics from this hospitalization (including imaging, microbiology, ancillary and laboratory) are listed below for reference.     Microbiology: Recent Results (from the past 240 hour(s))  SARS CORONAVIRUS 2 (TAT 6-24 HRS) Nasopharyngeal Nasopharyngeal Swab     Status: None   Collection Time: 08/03/19 10:50 PM   Specimen: Nasopharyngeal Swab  Result Value Ref Range Status   SARS Coronavirus 2 NEGATIVE NEGATIVE Final    Comment: (NOTE) SARS-CoV-2 target nucleic acids are NOT DETECTED. The SARS-CoV-2 RNA is generally detectable in upper and lower respiratory specimens during the acute phase of infection. Negative results do not preclude SARS-CoV-2 infection, do not rule out co-infections with other pathogens, and should not be used as the sole basis for treatment or other patient management decisions. Negative results must be combined with clinical observations, patient history, and epidemiological information. The expected result is Negative. Fact Sheet for Patients: 08/05/19 Fact  Sheet for Healthcare Providers: HairSlick.no This test is not yet approved or cleared by the quierodirigir.com FDA and  has been authorized for detection and/or diagnosis of SARS-CoV-2 by FDA under an Emergency Use Authorization (EUA). This EUA will remain  in effect (meaning this test can be used) for the duration of the COVID-19 declaration under Section 56 4(b)(1) of the Act, 21 U.S.C. section 360bbb-3(b)(1), unless the authorization is terminated or revoked sooner. Performed at [facility], [address] [place], [place]      Labs: BNP (last 3 results) No results for input(s): BNP in the last 8760 hours. Basic Metabolic Panel: Recent Labs  Lab 08/03/19 2046 08/04/19 0414 08/07/19 0450 08/08/19 0452  NA 141 139 141 140  K 4.0 3.9 4.0 4.5  CL 110 112* 108 107  CO2 24 22 25 25   GLUCOSE 111* 95 133* 126*  BUN 20 27* 28* 30*  CREATININE 0.85 0.69 0.66 0.75  CALCIUM 9.2 8.5* 9.3 8.7*   Liver Function Tests: Recent Labs  Lab 08/03/19 2046  AST 22  ALT 24  ALKPHOS 124  BILITOT 0.6  PROT 7.7  ALBUMIN 4.1   No results for input(s): LIPASE, AMYLASE in the last 168 hours. No results for input(s): AMMONIA in the last 168 hours. CBC: Recent Labs  Lab 08/03/19 2046 08/04/19 0414 08/07/19 0450 08/08/19 0452  WBC 8.4 6.7 14.3* 12.7*  HGB 14.4 12.3 13.2 12.8  HCT 41.6 34.9* 38.0 36.3  MCV 76.2* 75.5* 75.7* 74.2*  PLT 254 213 256 236   Cardiac Enzymes: No results for input(s): CKTOTAL, CKMB, CKMBINDEX, TROPONINI in the last 168 hours. BNP: Invalid input(s): POCBNP CBG: No results for input(s): GLUCAP in the last 168 hours. D-Dimer No results for input(s): DDIMER in the last 72 hours. Hgb A1c No results for input(s): HGBA1C in the last 72 hours. Lipid Profile No results for input(s): CHOL, HDL, LDLCALC, TRIG, CHOLHDL, LDLDIRECT in the last 72 hours. Thyroid function studies No results for input(s): TSH, T4TOTAL,  T3FREE, THYROIDAB in the last 72 hours.  Invalid input(s): FREET3 Anemia work up Recent Labs    08/07/19 0450  FERRITIN 36  TIBC 253  IRON 71   Urinalysis    Component Value Date/Time   COLORURINE YELLOW (A) 08/03/2019 2250   APPEARANCEUR CLEAR (A) 08/03/2019 2250   APPEARANCEUR Cloudy 07/31/2011 1838   LABSPEC 1.034 (H) 08/03/2019 2250   LABSPEC 1.030 07/31/2011 1838   PHURINE 5.0 08/03/2019 2250   GLUCOSEU NEGATIVE 08/03/2019 2250   GLUCOSEU Negative 07/31/2011 1838   HGBUR NEGATIVE 08/03/2019 2250   BILIRUBINUR NEGATIVE 08/03/2019 2250   BILIRUBINUR Negative 07/31/2011 1838   KETONESUR 20 (A) 08/03/2019 2250   PROTEINUR NEGATIVE 08/03/2019 2250   NITRITE NEGATIVE 08/03/2019 2250   LEUKOCYTESUR NEGATIVE 08/03/2019 2250   LEUKOCYTESUR Negative 07/31/2011 1838   Sepsis Labs Invalid input(s): PROCALCITONIN,  WBC,  LACTICIDVEN Microbiology Recent Results (from the past 240 hour(s))  SARS CORONAVIRUS 2 (TAT 6-24 HRS) Nasopharyngeal Nasopharyngeal Swab     Status: None   Collection Time: 08/03/19 10:50 PM   Specimen: Nasopharyngeal Swab  Result Value Ref Range Status   SARS Coronavirus 2 NEGATIVE NEGATIVE Final    Comment: (NOTE) SARS-CoV-2 target nucleic acids are NOT DETECTED. The SARS-CoV-2 RNA is generally detectable in upper and lower respiratory specimens during the acute phase of infection. Negative results do not preclude SARS-CoV-2 infection, do not rule out co-infections with other pathogens, and should not be used as the sole basis for treatment or other patient management decisions. Negative results must be combined with clinical observations, patient history, and epidemiological information. The expected result is Negative. Fact Sheet for Patients: SugarRoll.be Fact Sheet for Healthcare Providers: https://www.woods-mathews.com/ This test is not yet approved or cleared by the [place] FDA and  has been  authorized for detection and/or diagnosis of SARS-CoV-2 by FDA under an Emergency Use Authorization (EUA). This EUA will remain  in effect (meaning this test can be used) for the duration of the COVID-19 declaration under Section 56 4(b)(1) of the Act, 21 U.S.C. section 360bbb-3(b)(1), unless the authorization is terminated or revoked sooner. Performed at [facility], [place] [address] [place], [place] [address]      Time coordinating discharge: Over 30 minutes  SIGNED:   Sidney Ace, MD  [facility] 08/08/2019, 11:34 AM Pager   If 7PM-7AM, please contact night-coverage

## 2019-08-08 NOTE — TOC Transition Note (Signed)
Transition of Care [facility]) - CM/SW Discharge Note   Patient Details  Name: Shuree Brossart Knope MRN: [id number] Date of Birth: 1961-04-28  [facility]) CM/SW Contact:  Shawn Route, RN Phone Number: 08/08/2019, 11:12 AM   Clinical Narrative:     Patient to transfer to CIR today via [employer].  Discharge packet given to RN, MD and bedside nurse to complete EMTALA.  No further TOC needs at this time, please re-consult for new needs.     Final next level of care: IP Rehab Facility Barriers to Discharge: Barriers Resolved   Patient Goals and CMS Choice        Discharge Placement                Patient to be transferred to facility by: [facility]   Patient and family notified of of transfer: 08/08/19  Discharge Plan and Services                                     Social Determinants of Health (SDOH) Interventions     Readmission Risk Interventions No flowsheet data found.

## 2019-08-08 NOTE — Care Management Important Message (Signed)
Important Message  Patient Details  Name: Liane Tribbey Waters MRN: [id number] Date of Birth: 1961/01/25   Medicare Important Message Given:  Yes     Johnell Comings 08/08/2019, 12:20 PM

## 2019-08-08 NOTE — Progress Notes (Signed)
Report given to [facility].

## 2019-08-08 NOTE — Progress Notes (Signed)
Inpatient Rehabilitation-Admissions Coordinator   I have received medical clearance from Dr. Georgeann Oppenheim for admit to CIR today. I have notified the patient her her approval and bed opening and she wants to proceed with CIR today. We reviewed insurance benefits letter and consent forms. AC has notified RN and TOC team of plan for today. AC has scheduled [facility] pick [facility] to [facility].   [facility] unable to confirm a time for pickup but estimated around 1:15.   Cheri Rous, OTR/L  Rehab Admissions Coordinator  [phone] 08/08/2019 11:34 AM

## 2019-08-09 ENCOUNTER — Inpatient Hospital Stay (HOSPITAL_COMMUNITY): Payer: PPO

## 2019-08-09 ENCOUNTER — Inpatient Hospital Stay (HOSPITAL_COMMUNITY): Payer: PPO | Admitting: Physical Therapy

## 2019-08-09 DIAGNOSIS — G35 Multiple sclerosis: Secondary | ICD-10-CM

## 2019-08-09 DIAGNOSIS — I10 Essential (primary) hypertension: Secondary | ICD-10-CM

## 2019-08-09 DIAGNOSIS — G609 Hereditary and idiopathic neuropathy, unspecified: Secondary | ICD-10-CM

## 2019-08-09 NOTE — Evaluation (Signed)
Occupational Therapy Assessment and Plan  Patient Details  Name: Lisa Williams MRN: [id number] Date of Birth: 12/09/1960  OT Diagnosis: muscle weakness (generalized) Rehab Potential: Rehab Potential (ACUTE ONLY): Good ELOS: 7-10 days   Today's Date: 08/09/2019 OT Individual Time: 0254-2706 and 1330-1430 OT Individual Time Calculation (min): 75 min  And 60 min    Problem List:  Patient Active Problem List   Diagnosis Date Noted  . Abscess of female pelvis   . SVT (supraventricular tachycardia) ([place])   . Radial styloid tenosynovitis 03/12/2018  . Wheelchair confinement 02/27/2018  . Localized osteoporosis with current pathological fracture with routine healing 01/19/2017  . Wrist fracture 01/16/2017  . Sprain of ankle 03/23/2016  . Closed fracture of lateral malleolus 03/16/2016  . Health care maintenance 01/24/2016  . Blood pressure elevated without history of HTN 10/25/2015  . Essential hypertension 10/25/2015  . Multiple sclerosis ([place]) 10/02/2015  . Chronic left shoulder pain 07/19/2015  . Multiple sclerosis exacerbation ([place]) 07/14/2015  . MS (multiple sclerosis) ([place]) 11/26/2014  . Increased body mass index 11/26/2014  . HPV test positive 11/26/2014  . Status post laparoscopic supracervical hysterectomy 11/26/2014  . Galactorrhea 11/26/2014  . Back ache 05/21/2014  . Adiposity 05/21/2014  . Disordered sleep 05/21/2014  . Muscle spasticity 05/21/2014  . Spasticity 05/21/2014  . Calculus of kidney 12/09/2013  . Renal colic 23/76/2831  . Hypercholesteremia 08/19/2013  . Hereditary and idiopathic neuropathy 08/19/2013  . Hypercholesterolemia without hypertriglyceridemia 08/19/2013  . Bladder infection, chronic 07/25/2012  . Disorder of bladder function 07/25/2012  . Incomplete bladder emptying 07/25/2012  . Microscopic hematuria 07/25/2012  . Right upper quadrant pain 07/25/2012    Past Medical History:  Past Medical History:  Diagnosis Date  .  Abdominal pain, right upper quadrant   . Back pain   . Calculus of kidney 12/09/2013  . Chronic back pain    unspecified  . Chronic left shoulder pain 07/19/2015  . Functional disorder of bladder    other  . Galactorrhea 11/26/2014   Chronic   . Hereditary and idiopathic neuropathy 08/19/2013  . HPV test positive   . Hypercholesteremia 08/19/2013  . Incomplete bladder emptying   . Microscopic hematuria   . MS (multiple sclerosis) ([place])   . Muscle spasticity 05/21/2014  . Nonspecific findings on examination of urine    other  . Osteopenia   . Status post laparoscopic supracervical hysterectomy 11/26/2014  . Tobacco user 11/26/2014  . Wrist fracture    Past Surgical History:  Past Surgical History:  Procedure Laterality Date  . bilateral tubal ligation  1996  . BREAST CYST EXCISION Left 2002  . KNEE SURGERY     right  . LAPAROSCOPIC SUPRACERVICAL HYSTERECTOMY  08/05/2013  . ORIF WRIST FRACTURE Left 01/17/2017   Procedure: OPEN REDUCTION INTERNAL FIXATION (ORIF) WRIST FRACTURE;  Surgeon: Lovell Sheehan, MD;  Location: [facility] ORS;  Service: Orthopedics;  Laterality: Left;  . TUBAL LIGATION Bilateral   . VAGINAL HYSTERECTOMY  03/2006    Assessment & Plan Clinical Impression: Patient is a 59 y.o. year old female with recent admission to the hospital. Lisa Williams. Lisa Williams a 59 year old right-handed African-American female with history ofmultiple sclerosis diagnosed 1995 with multiple exacerbations followed by Dr. Larinda Buttery clinic,hypertension, hereditary and idiopathic neuropathy, HPV chronic back pain, tobacco abuse, hyperlipidemia and neurogenic bladder.Per chart review patient independent with assistive device prior to admissionfor ambulating short distances and uses a manual wheelchair at homeand was receiving some outpatient therapies.She does drive with  hand controls. Presented 08/03/2019 with increasing bilateral lower extremity weakness and decrease in sensation.  Patient had just received her Covid vaccine the day prior. Admission chemistries unremarkable, SARS coronavirus negative urinalysis negative nitrite. Neurology follow-up suspect MS exacerbation placed on 5 days IV Solu-Medrol. Subcutaneous Lovenox for DVT prophylaxis. Therapy evaluations completed and patient was admitted for a comprehensive rehab program tolerating a regular diet.  Patient transferred to CIR on 08/08/2019 .  Patient currently requires min with basic self-care skills secondary to muscle weakness, decreased cardiorespiratoy endurance and decreased standing balance, decreased postural control and decreased balance strategies.  Prior to hospitalization, patient could complete ADLs with Mod I overall and min A for bathing.  Patient will benefit from skilled intervention to increase independence with basic self-care skills prior to discharge home with sister and mother.  Anticipate patient will require intermittent supervision and follow up home health.  OT - End of Session Activity Tolerance: Decreased this session Endurance Deficit: Yes Endurance Deficit Description: mild-moderate decreased endurance OT Assessment Rehab Potential (ACUTE ONLY): Good OT Patient demonstrates impairments in the following area(s): Balance;Endurance;Motor OT Basic ADL's Functional Problem(s): Bathing;Dressing;Toileting OT Transfers Functional Problem(s): Toilet;Tub/Shower OT Additional Impairment(s): None OT Plan OT Intensity: Minimum of 1-2 x/day, 45 to 90 minutes OT Frequency: 5 out of 7 days OT Duration/Estimated Length of Stay: 7-10 days OT Treatment/Interventions: Balance/vestibular training;Discharge planning;Self Care/advanced ADL retraining;Therapeutic Activities;UE/LE Coordination activities;Functional mobility training;Patient/family education;Therapeutic Exercise;Community reintegration;Neuromuscular re-education;Psychosocial support;UE/LE Strength taining/ROM;Wheelchair  propulsion/positioning OT Self Feeding Anticipated Outcome(s): Mod I OT Basic Self-Care Anticipated Outcome(s): Supervision- Mod I OT Toileting Anticipated Outcome(s): Supervision OT Bathroom Transfers Anticipated Outcome(s): Supervision OT Recommendation Patient destination: Home Follow Up Recommendations: Home health OT Equipment Recommended: Tub/shower bench Equipment Details: bariatric TTB; Pt has all other equipment   Skilled Therapeutic Intervention Session 1: OT evaluation completed with OT providing education on IPR, role of OT, and ELOS. OT engaged client in ADL retraining completing sponge bath while seated EOB. Pt required min A to position BLEs using crossover technique when donning pants and washing feet. Educated on use of reacher/sock-aid, however pt reports only able to use reacher for doffing socks. Pt required min A for sit<>stand and standing balance with ADLs, then assist for thoroughness with buttocks hygiene. Completed stand pivot transfer bed<>BSC with min A. Of note, pt demonstrates poor controlled descent with stand>sit during self-care tasks. At end of session, pt left sitting in w/c with all needs in reach. OT updated nursing staff on CLOF and transfer recommendations.   Session 2: OT session focused on ADL retraining, strengthening, sit<>stand, and functional endurance. Pt ambulated short distance from room >toilet with min A, completing toileting task with CGA for balance during clothing management. Upon returning to w/c, pt propelled self to ADL apartment and therapy gym to promote UB strength/endurance. Educated pt on TTB and encouraged practicing as it was appropriate size for her weight, however pt declined and requested to wait for a bariatric chair. Educated on grab bars for increased safety with showers at home. In therapy gym, pt completed reciprocal heel slides 3x 10 reps with min A. Practiced sit<>stand 5x from mat table of various heights. Pt required mod A  sit<>stand from lower surface and min-CGA for higher services. At end of session, pt left sitting in w/c with all needs in reach.    OT Evaluation Precautions/Restrictions  Precautions Precautions: None General Chart Reviewed: Yes Vital Signs  Pain Pain Assessment Pain Scale: 0-10 Home Living/Prior Functioning Home Living Family/patient expects to be discharged  to:: Private residence Available Help at Discharge: Family Type of Home: House Home Access: Stairs to enter Technical brewer of Steps: 1 Home Layout: One level Bathroom Shower/Tub: Government social research officer Accessibility: Yes Additional Comments: pt already has manual & motorized w/c, RW, hospital bed  Lives With: Family IADL History Homemaking Responsibilities: No Current License: Yes Prior Function Level of Independence: Needs assistance with ADLs Bath: Minimal  Able to Take Stairs?: No Driving: Yes Vocation: On disability Leisure: Hobbies-yes (Comment) Comments: "shopping & hanging out" ADL   Vision Baseline Vision/History: No visual deficits Patient Visual Report: No change from baseline Vision Assessment?: No apparent visual deficits Perception  Perception: Within Functional Limits Praxis Praxis: Intact Cognition Overall Cognitive Status: Within Functional Limits for tasks assessed Arousal/Alertness: Awake/alert Orientation Level: Person;Situation;Place Person: Oriented Place: Oriented Situation: Oriented Year: 2021 Month: April Day of Week: Correct Memory: Appears intact Immediate Memory Recall: Sock;Blue;Bed Memory Recall Sock: Without Cue Memory Recall Blue: Without Cue Memory Recall Bed: Without Cue Attention: Alternating Awareness: Appears intact Problem Solving: Appears intact Safety/Judgment: Appears intact Sensation Sensation Light Touch: Appears Intact Stereognosis: Not tested Coordination Gross Motor Movements are Fluid and Coordinated:  No Fine Motor Movements are Fluid and Coordinated: Yes Coordination and Movement Description: impaired BLE Motor  Motor Motor - Skilled Clinical Observations: compensatory movement strategies 2/2 MS, generalized deconditioning Mobility  Bed Mobility Bed Mobility: Rolling Right;Supine to Sit;Sit to Supine(bed flat, bed rails in hospital bed) Rolling Right: Supervision/verbal cueing Supine to Sit: Minimal Assistance - Patient > 75% Sit to Supine: Moderate Assistance - Patient 50-74%(pt reports this is baseline) Transfers Sit to Stand: Minimal Assistance - Patient > 75% Stand to Sit: Minimal Assistance - Patient > 75%  Trunk/Postural Assessment  Cervical Assessment Cervical Assessment: Within Functional Limits Thoracic Assessment Thoracic Assessment: Within Functional Limits Lumbar Assessment Lumbar Assessment: Within Functional Limits Postural Control Postural Control: Within Functional Limits Righting Reactions: delayed Protective Responses: delayed  Balance Balance Balance Assessed: Yes Static Sitting Balance Static Sitting - Balance Support: Feet supported Static Sitting - Level of Assistance: 5: Stand by assistance Dynamic Standing Balance Dynamic Standing - Balance Support: During functional activity Dynamic Standing - Level of Assistance: 4: Min assist Dynamic Standing - Balance Activities: (during gait with RW) Extremity/Trunk Assessment RUE Assessment RUE Assessment: Within Functional Limits General Strength Comments: 4-/5 LUE Assessment LUE Assessment: Within Functional Limits General Strength Comments: 4-/5     Refer to Care Plan for Long Term Goals  Recommendations for other services: None    Discharge Criteria: Patient will be discharged from OT if patient refuses treatment 3 consecutive times without medical reason, if treatment goals not met, if there is a change in medical status, if patient makes no progress towards goals or if patient is discharged  from hospital.  The above assessment, treatment plan, treatment alternatives and goals were discussed and mutually agreed upon: by patient  Duayne Cal 08/09/2019, 12:43 PM

## 2019-08-09 NOTE — Evaluation (Addendum)
Physical Therapy Assessment and Plan  Patient Details  Name: Lisa Williams MRN: [id number] Date of Birth: March 19, 1961  PT Diagnosis: Abnormality of gait, Difficulty walking and Muscle weakness Rehab Potential: Good ELOS: 1 week   Today's Date: 08/09/2019 PT Individual Time: 6761-9509 PT Individual Time Calculation (min): 56 min    Problem List:  Patient Active Problem List   Diagnosis Date Noted  . Abscess of female pelvis   . SVT (supraventricular tachycardia) ([place])   . Radial styloid tenosynovitis 03/12/2018  . Wheelchair confinement 02/27/2018  . Localized osteoporosis with current pathological fracture with routine healing 01/19/2017  . Wrist fracture 01/16/2017  . Sprain of ankle 03/23/2016  . Closed fracture of lateral malleolus 03/16/2016  . Health care maintenance 01/24/2016  . Blood pressure elevated without history of HTN 10/25/2015  . Essential hypertension 10/25/2015  . Multiple sclerosis ([place]) 10/02/2015  . Chronic left shoulder pain 07/19/2015  . Multiple sclerosis exacerbation ([place]) 07/14/2015  . MS (multiple sclerosis) ([place]) 11/26/2014  . Increased body mass index 11/26/2014  . HPV test positive 11/26/2014  . Status post laparoscopic supracervical hysterectomy 11/26/2014  . Galactorrhea 11/26/2014  . Back ache 05/21/2014  . Adiposity 05/21/2014  . Disordered sleep 05/21/2014  . Muscle spasticity 05/21/2014  . Spasticity 05/21/2014  . Calculus of kidney 12/09/2013  . Renal colic 32/67/1245  . Hypercholesteremia 08/19/2013  . Hereditary and idiopathic neuropathy 08/19/2013  . Hypercholesterolemia without hypertriglyceridemia 08/19/2013  . Bladder infection, chronic 07/25/2012  . Disorder of bladder function 07/25/2012  . Incomplete bladder emptying 07/25/2012  . Microscopic hematuria 07/25/2012  . Right upper quadrant pain 07/25/2012    Past Medical History:  Past Medical History:  Diagnosis Date  . Abdominal pain, right upper quadrant   .  Back pain   . Calculus of kidney 12/09/2013  . Chronic back pain    unspecified  . Chronic left shoulder pain 07/19/2015  . Functional disorder of bladder    other  . Galactorrhea 11/26/2014   Chronic   . Hereditary and idiopathic neuropathy 08/19/2013  . HPV test positive   . Hypercholesteremia 08/19/2013  . Incomplete bladder emptying   . Microscopic hematuria   . MS (multiple sclerosis) ([place])   . Muscle spasticity 05/21/2014  . Nonspecific findings on examination of urine    other  . Osteopenia   . Status post laparoscopic supracervical hysterectomy 11/26/2014  . Tobacco user 11/26/2014  . Wrist fracture    Past Surgical History:  Past Surgical History:  Procedure Laterality Date  . bilateral tubal ligation  1996  . BREAST CYST EXCISION Left 2002  . KNEE SURGERY     right  . LAPAROSCOPIC SUPRACERVICAL HYSTERECTOMY  08/05/2013  . ORIF WRIST FRACTURE Left 01/17/2017   Procedure: OPEN REDUCTION INTERNAL FIXATION (ORIF) WRIST FRACTURE;  Surgeon: Lovell Sheehan, MD;  Location: [facility] ORS;  Service: Orthopedics;  Laterality: Left;  . TUBAL LIGATION Bilateral   . VAGINAL HYSTERECTOMY  03/2006    Assessment & Plan Clinical Impression: Patient is a 59 y.o. year old female with recent admission to the hospital on Lisa Williams. Lisa Williams is a 59 year old right-handed African-American female with history of multiple sclerosis diagnosed 1995 with multiple exacerbations followed by Dr. Larinda Williams clinic, hypertension, hereditary and idiopathic neuropathy, HPV chronic back pain, tobacco abuse, hyperlipidemia and neurogenic bladder.  Per chart review patient independent with assistive device prior to admission for ambulating short distances and uses a manual wheelchair at home and was receiving some outpatient therapies.  She does drive with hand controls.  Presented 08/03/2019 with increasing bilateral lower extremity weakness and decrease in sensation.  Patient had just received her Covid  vaccine the day prior.  Admission chemistries unremarkable, SARS coronavirus negative urinalysis negative nitrite.  Neurology follow-up suspect MS exacerbation placed on 5 days IV Solu-Medrol.  Subcutaneous Lovenox for DVT prophylaxis.  Therapy evaluations completed and patient was admitted for a comprehensive rehab program tolerating a regular diet.  Patient transferred to CIR on 08/08/2019 .   Patient currently requires min with mobility secondary to muscle weakness, decreased cardiorespiratoy endurance, decreased coordination, and decreased standing balance, decreased postural control and decreased balance strategies.  Prior to hospitalization, patient was modified independent  with mobility and lived with (mother & sister) in a House home.  Home access is  (make shift ramped entrance, but pt prefers to ambulate car>door and sit in w/c parked at door thus bypassing small threshold step as pt is unable to negotiate steps at this time).  Patient will benefit from skilled PT intervention to maximize safe functional mobility, minimize fall risk and decrease caregiver burden for planned discharge home with intermittent assist.  Anticipate patient will benefit from follow up OP at discharge.  PT - End of Session Activity Tolerance: Tolerates 30+ min activity with multiple rests Endurance Deficit: Yes Endurance Deficit Description: generalized deconditioning PT Assessment Rehab Potential (ACUTE/IP ONLY): Good PT Patient demonstrates impairments in the following area(s): Balance;Endurance;Motor PT Transfers Functional Problem(s): Bed to Chair;Bed Mobility;Car PT Locomotion Functional Problem(s): Wheelchair Mobility;Ambulation PT Plan PT Intensity: Minimum of 1-2 x/day ,45 to 90 minutes PT Frequency: 5 out of 7 days PT Duration Estimated Length of Stay: 1 week PT Treatment/Interventions: Ambulation/gait training;Community reintegration;DME/adaptive equipment instruction;Neuromuscular  re-education;Psychosocial support;UE/LE Strength taining/ROM;Wheelchair propulsion/positioning;UE/LE Coordination activities;Therapeutic Activities;Skin care/wound management;Balance/vestibular training;Discharge planning;Pain management;Functional mobility training;Disease management/prevention;Patient/family education;Splinting/orthotics;Therapeutic Exercise PT Transfers Anticipated Outcome(s): supervision with LRAD PT Locomotion Anticipated Outcome(s): supervision gait with RW for household distances, mod I w/c level PT Recommendation Recommendations for Other Services: Therapeutic Recreation consult Therapeutic Recreation Interventions: Stress management;Outing/community reintergration Follow Up Recommendations: Outpatient PT(unless pt does not have transportation) Patient destination: Home Equipment Recommended: None recommended by PT(pt already has DME)  Skilled Therapeutic Intervention Pt received in w/c & agreeable to tx. Pt already dressed with personal custom B AFO's donned. Pt provides PLOF & home set up information & therapist educates pt on ELOS, daily therapy schedule, weekly team meetings, need for staff assistance when wanting to get OOB or out of w/c & other CIR information. Provided pt with w/c cushion to prevent skin breakdown & promote OOB tolerance. Pt completes car transfer at SUV simulated height with min assist to ambulate to car but pt unable to place BLE in/out of car & unwilling to attempt with PT assistance 2/2 elevated floor board & pt reporting her personal SUV is more accommodating. Pt completes sit<>stand transfers with min assist with BUE pushing up on armrests. Pt ambulates 50 ft with RW & min assist with gait pattern noted below, as well as ambulated in room/bathroom with RW & min assist. Pt completes toilet transfer with RW & min assist from elevated seat height; pt with continent void with significantly extra time (pt reports this is baseline) and pt is able to manage  clothing with close supervision<>CGA for balance with pt holding to RW with 1UE at all times. Pt propels w/c with BUE & supervision x 125 ft. Pt completes bed mobility in hospital bed in room with bed flat, with rails with  mod assist for sit>supine, supervision supine>sit. Pt does report some fatigue after gait trials with rest breaks provided. At end of session pt left in w/c with chair alarm donned & all needs at hand.  PLOF information: Pt reports she required assistance to swing BLE into her hospital bed at home. Pt has been attending OPPT for past 1.5 years and has progressed to ambulating ~60 ft with RW. Pt reports she is close to her baseline with her gait pattern, but is still a little "shaky" & unbalanced during transfers/standing. Pt's mother & sister provide assistance at home. Pt drives a [employer] with hand controls. Pt has a make shift ramped entrance (2x4 with plywood) that she can use if necessary for her w/c to get in/out of the house but pt prefers to ambulate from car>door with RW & sit in w/c parked at doorway then use w/c inside (this allows pt to bypass small threshold step to enter house, as pt reports she has not be able to negotiate stairs in OPPT yet). Pt has compensatory movement strategies since MS diagnosis and is a good advocate for herself, willing to try challenging things in therapy without being unsafe. Pt also uses a TTB at home. Pt does not sit on any furniture, only sitting in her w/c at home, as she is unable to transfer off of low furniture.   Addendum: educated pt on inability to drive until cleared by MD.  PT Evaluation Precautions/Restrictions Precautions Precautions: Fall Required Braces or Orthoses: (pt has personal B AFO's) Restrictions Weight Bearing Restrictions: No   General Chart Reviewed: Yes Additional Pertinent History: MS, chronic L shoulder pain, neuropathy, HPV, hypercholesterolemia, osteopenia, SVT, wrist fx, HTN Response to Previous  Treatment: Patient reporting fatigue but able to participate. Family/Caregiver Present: No    Pain Pt reports discomfort on buttocks 2/2 no cushion in w/c - provided pt with cushion with pt reporting increased comfort.  Home Living/Prior Functioning Home Living Available Help at Discharge: Family;Available 24 hours/day(mom home 24 hrs/day, sister works but assists at night) Type of Home: House Home Access: (make shift ramped entrance, but pt prefers to ambulate car>door and sit in w/c parked at door thus bypassing small threshold step as pt is unable to negotiate steps at this time) Home Layout: Two level;Able to live on main level with bedroom/bathroom  Lives With: (mother & sister) Prior Function Level of Independence: (mod I for short household distances with RW, mod I w/c level in home)  Able to Take Stairs?: No Driving: Yes(SUV with hand controls) Vocation: On disability Leisure: Hobbies-yes (Comment) Comments: "shopping & hanging out" Pt already has RW, manual & motorized w/c, hospital bed.  Vision/Perception  Pt wears glasses at all times at baseline. No changes in baseline vision. Perception & praxis appear WNL.  Cognition Overall Cognitive Status: Within Functional Limits for tasks assessed Arousal/Alertness: Awake/alert Orientation Level: Oriented X4 Memory: Appears intact Awareness: Appears intact Problem Solving: Appears intact Safety/Judgment: Appears intact  Sensation Sensation Light Touch: Not tested Stereognosis: Not tested Coordination Gross Motor Movements are Fluid and Coordinated: No Coordination and Movement Description: impaired BLE  Motor  Motor Motor - Skilled Clinical Observations: compensatory movement strategies 2/2 MS, generalized deconditioning   Mobility Bed Mobility Bed Mobility: Rolling Right;Supine to Sit;Sit to Supine(bed flat, bed rails in hospital bed) Rolling Right: Supervision/verbal cueing Supine to Sit: Supervision/Verbal  cueing Sit to Supine: Moderate Assistance - Patient 50-74%(pt reports this is baseline) Transfers Transfers: Sit to Stand;Stand to Sit Sit to Stand:  Minimal Assistance - Patient > 75% Stand to Sit: Minimal Assistance - Patient > 75% Transfer (Assistive device): (armrests, RW)  Locomotion  Gait Ambulation: Yes Gait Assistance: Minimal Assistance - Patient > 75% Gait Distance (Feet): 50 Feet Assistive device: Rolling walker(personal custom B AFO's) Gait Gait: Yes Gait Pattern: Impaired Gait Pattern: Decreased step length - right;Decreased step length - left;Step-to pattern;Decreased stance time - left;Decreased stride length;Decreased hip/knee flexion - right;Decreased hip/knee flexion - left;Decreased dorsiflexion - right;Decreased dorsiflexion - left;Decreased weight shift to left;Poor foot clearance - right;Poor foot clearance - left(decreased foot clearance BLE, very minimal B hip/knee flexion during swing phase) Gait velocity: decreased Stairs / Additional Locomotion Stairs: No Wheelchair Mobility Wheelchair Mobility: Yes Wheelchair Assistance: Chartered loss adjuster: Both upper extremities Wheelchair Parts Management: Independent Distance: 125 ft   Trunk/Postural Assessment  Postural Control Postural Control: Deficits on evaluation Righting Reactions: delayed Protective Responses: delayed   Restaurant manager, fast food Assessed: Yes Static Sitting Balance Static Sitting - Balance Support: Feet supported Static Sitting - Level of Assistance: 5: Stand by assistance Dynamic Standing Balance Dynamic Standing - Balance Support: During functional activity;Bilateral upper extremity supported Dynamic Standing - Level of Assistance: 4: Min assist Dynamic Standing - Balance Activities: (during gait with RW)  Extremity Assessment  RUE Assessment RUE Assessment: Within Functional Limits LUE Assessment LUE Assessment: Within Functional Limits RLE  Assessment RLE Assessment: (not formally tested, pt with personal custom B AFO's, grossly 3+/5 as pt able to weight bear without buckling but definite weakness in isolated muscle groups as noted by gait pattern) LLE Assessment LLE Assessment: (not formally tested, pt with personal custom B AFO's, grossly 3+/5 as pt able to weight bear without buckling but definite weakness in isolated muscle groups as noted by gait pattern)    Refer to Care Plan for Long Term Goals  Recommendations for other services: Therapeutic Recreation  Kitchen group, Stress management and Outing/community reintegration  Discharge Criteria: Patient will be discharged from PT if patient refuses treatment 3 consecutive times without medical reason, if treatment goals not met, if there is a change in medical status, if patient makes no progress towards goals or if patient is discharged from hospital.  The above assessment, treatment plan, treatment alternatives and goals were discussed and mutually agreed upon: by patient  Waunita Schooner 08/09/2019, 11:23 AM

## 2019-08-09 NOTE — Progress Notes (Signed)
Patient noted in bed throughout the shift. She was alert & responsive. No c/o pain or discomfort. She refused scheduled pepcid last night, stated that she did not need it because she was no longer on the steroids. Patient was noted to have had bradycardia. Last night, her pulse was 50, this morning it was 41. She was asymptomatic. Noted in a note from internal medicine that patient had sinus bradycardia. Communication left for the provider on rounds for an evaluation. No acute distress noted. Report given to oncoming nurse.

## 2019-08-09 NOTE — H&P (Signed)
Physical Medicine and Rehabilitation Admission H&P        Chief Complaint  Patient presents with  . Weakness  : HPI: Lisa Williams is a 59 year old right-handed African-American female with history of multiple sclerosis diagnosed 1995 with multiple exacerbations followed by Dr. [facility], hypertension, hereditary and idiopathic neuropathy, HPV chronic back pain, tobacco abuse, hyperlipidemia and neurogenic bladder.  Per chart review patient independent with assistive device prior to admission for ambulating short distances and uses a manual wheelchair at home and was receiving some outpatient therapies.  She does drive with hand controls.  Presented 08/03/2019 with increasing bilateral lower extremity weakness and decrease in sensation.  Patient had just received her Covid vaccine the day prior.  Admission chemistries unremarkable, SARS coronavirus negative urinalysis negative nitrite.  Neurology follow-up suspect MS exacerbation placed on 5 days IV Solu-Medrol.  Subcutaneous Lovenox for DVT prophylaxis.  Therapy evaluations completed and patient was admitted for a comprehensive rehab program tolerating a regular diet.   Review of Systems  Constitutional: Negative for chills and fever.  HENT: Negative for hearing loss.   Eyes: Negative for blurred vision and double vision.  Respiratory: Negative for cough and shortness of breath.   Cardiovascular: Negative for chest pain and palpitations.  Gastrointestinal: Positive for constipation. Negative for heartburn and nausea.  Genitourinary: Negative for dysuria, flank pain and hematuria.       Bladder stress incontinence  Musculoskeletal: Positive for back pain, joint pain and myalgias.       Chronic back pain  Skin: Negative for rash.  Neurological: Positive for weakness.       Spasticity  All other systems reviewed and are negative.       Past Medical History:  Diagnosis Date  . Abdominal pain, right upper  quadrant    . Back pain    . Calculus of kidney 12/09/2013  . Chronic back pain      unspecified  . Chronic left shoulder pain 07/19/2015  . Functional disorder of bladder      other  . Galactorrhea 11/26/2014    Chronic   . Hereditary and idiopathic neuropathy 08/19/2013  . HPV test positive    . Hypercholesteremia 08/19/2013  . Incomplete bladder emptying    . Microscopic hematuria    . MS (multiple sclerosis) ([place])    . Muscle spasticity 05/21/2014  . Nonspecific findings on examination of urine      other  . Osteopenia    . Status post laparoscopic supracervical hysterectomy 11/26/2014  . Tobacco user 11/26/2014  . Wrist fracture           Past Surgical History:  Procedure Laterality Date  . bilateral tubal ligation   1996  . BREAST CYST EXCISION Left 2002  . KNEE SURGERY        right  . LAPAROSCOPIC SUPRACERVICAL HYSTERECTOMY   08/05/2013  . ORIF WRIST FRACTURE Left 01/17/2017    Procedure: OPEN REDUCTION INTERNAL FIXATION (ORIF) WRIST FRACTURE;  Surgeon: Lisa Sheehan, MD;  Location: [facility] ORS;  Service: Orthopedics;  Laterality: Left;  . TUBAL LIGATION Bilateral    . VAGINAL HYSTERECTOMY   03/2006         Family History  Problem Relation Age of Onset  . Sickle cell trait Sister    . Hypertension Sister    . Supraventricular tachycardia Sister    . Hypertension Sister    . Diabetes Maternal Grandmother    . Liver cancer  Maternal Grandmother    . Diabetes Maternal Aunt    . Breast cancer Maternal Aunt          great MAT  . Ovarian cancer Paternal Grandmother    . Nephrolithiasis Father    . Hypertension Father    . Prostate cancer Father    . Kidney disease Father    . Heart failure Father    . Hypertension Sister    . Osteoarthritis Mother    . Hypertension Mother    . Hyperlipidemia Mother    . GU problems Neg Hx    . Urolithiasis Neg Hx      Social History:  reports that she has quit smoking. Her smoking use included cigarettes. She smoked 0.50 packs per  day. She has never used smokeless tobacco. She reports that she does not drink alcohol or use drugs. Allergies:       Allergies  Allergen Reactions  . Amlodipine Other (See Comments)      edema  . Povidone Iodine Rash          Medications Prior to Admission  Medication Sig Dispense Refill  . baclofen (LIORESAL) 20 MG tablet Take 20 mg by mouth 3 (three) times daily.       . Cholecalciferol (EQL VITAMIN D3) 25 MCG (1000 UT) tablet Take 2,000 Units by mouth daily.      Marland Kitchen gabapentin (NEURONTIN) 100 MG capsule Take 100 mg by mouth 2 (two) times daily.       Marland Kitchen ibandronate (BONIVA) 150 MG tablet TAKE 1 TABLET BY MOUTH EVERY 30 DAYS FIRST THING IN THE MORNING WITH A FULL GLASS OF WATER. DO NOT EAT, DRINK OR LIE DOWN FOR 60 MINUTES AFTER      . oxybutynin (DITROPAN-XL) 10 MG 24 hr tablet Take 1 tablet (10 mg total) by mouth daily. 30 tablet 11  . telmisartan (MICARDIS) 40 MG tablet Take 40 mg by mouth daily.      Marland Kitchen oxybutynin (DITROPAN) 5 MG tablet TAKE 1 TABLET BY MOUTH TWICE A DAY 180 tablet 1      Drug Regimen Review Drug regimen was reviewed and remains appropriate with no significant issues identified   Home: Home Living Family/patient expects to be discharged to:: Private residence Living Arrangements: Other relatives(lives with sister and Mother) Available Help at Discharge: Family, Available 24 hours/day Type of Home: House Home Access: Ramped entrance Home Layout: Two level, Able to live on main level with bedroom/bathroom Bathroom Shower/Tub: Engineer, manufacturing systems: Standard Bathroom Accessibility: Yes Home Equipment: Environmental consultant - 2 wheels, Grab bars - tub/shower, Grab bars - toilet, Wheelchair - manual, Shower seat - built in, Hand held shower head Additional Comments: Pt denies falls hx in past year  Lives With: Other (Comment)(sister, Lisa Williams and mother)   Functional History: Prior Function Level of Independence: Independent with assistive device(s) Comments:  ambulates short distance with outpatient PT, uses manual chair at home with RW for mobility, drives with hand controls, dresses ind.   Functional Status:  Mobility: Bed Mobility Overal bed mobility: Needs Assistance Bed Mobility: Supine to Sit, Sit to Supine Rolling: Min guard Supine to sit: HOB elevated, Min guard Sit to supine: +2 for physical assistance, Min assist, HOB elevated General bed mobility comments: Pt was able to progress from long sitting to short sit with CGA. increased time to perform with use of bedrails. She did however require +2 min assist to return to long sitting in bed after OOB activity. One therapist assist  BLEs into bed while 2nd person controlled upper body decent. Transfers Overall transfer level: Needs assistance Equipment used: Rolling walker (2 wheeled) Transfers: Sit to/from Stand Sit to Stand: +2 safety/equipment, +2 physical assistance, Min assist  Lateral/Scoot Transfers: Supervision, Min guard General transfer comment: Pt was able to stand EOB 3 x throughout session with +2 min assist for safety. Bed height was elevated and vcs throughout for technique. Once standing, pt require only CGA for static standing balance but min assist for dynamic/ambulation Ambulation/Gait Ambulation/Gait assistance: Min assist, +2 safety/equipment Gait Distance (Feet): 50 Feet Assistive device: Rolling walker (2 wheeled) Gait Pattern/deviations: Decreased step length - right, Wide base of support, Decreased step length - left General Gait Details: Pt was able to tolerated ambulation ~ 50 ft with RW + w/c follow for safety. HR elevated to 151 bpm but quickly resolves to 90s after only 45 sec seated rest. PT requires increased time to ambulate with BLE AFOs donned. She has to ambulate with severe klateral wt shift to allow opposite LE advancement. Min assist throughout for safety. Pt very fatigued post ambulation Gait velocity: decreased   ADL: ADL Overall ADL's : Needs  assistance/impaired General ADL Comments: Pt reports she is independent for all BADL management at baseline. She uses a Manual WC for fxl mobility on "bad days" and using a 2WW for mobility on good days. She uses a hospital bed, long handle reacher, and hand held showerhead as additional support for baseline function. Pt currently requires moderate to max assist for LB ADL management including dressing and bathing. She requires max assist to don/dof hospital socks at bed level this date. She demonstrates limited ability to perform SLR to assist.   Cognition: Cognition Overall Cognitive Status: Within Functional Limits for tasks assessed Orientation Level: Oriented X4 Cognition Arousal/Alertness: Awake/alert Behavior During Therapy: WFL for tasks assessed/performed Overall Cognitive Status: Within Functional Limits for tasks assessed General Comments: Pt is A and O x 4 and very motivated.    Physical Exam: Blood pressure (!) 142/70, pulse (!) 58, temperature 97.6 F (36.4 C), temperature source Oral, resp. rate 12, height 5\' 11"  (1.803 m), weight 122.3 kg, SpO2 96 %. Physical Exam  Constitutional: She is oriented to person, place, and time. She appears well-developed and well-nourished. No distress. obese HENT:  Head: Normocephalic.  Nose: Nose normal.  Mouth/Throat: Oropharynx is clear and moist.  Eyes: Pupils are equal, round, and reactive to light. Conjunctivae and EOM are normal.  Neck: No thyromegaly present.  Cardiovascular: Normal rate and regular rhythm. Exam reveals no friction rub.  No murmur heard. Respiratory: Effort normal. No respiratory distress. She has no wheezes.  GI: Soft. She exhibits no distension. There is no abdominal tenderness.  Musculoskeletal:        General: Normal range of motion.     Cervical back: Normal range of motion.  Neurological: She is alert and oriented to person, place, and time.     Normal CN. UE motor 5/5. LE:   1+ to 2- HF, KE and similar in  both ankles. Has AFO's in place which fit appropriately. Decreased LT/PP in both legs. DTR's trace.  Skin: Skin is warm and dry. She is not diaphoretic. No erythema.  Psychiatric: She has a normal mood and affect. Her behavior is normal.      Lab Results Last 48 Hours  Results for orders placed or performed during the hospital encounter of 08/03/19 (from the past 48 hour(s))  CBC     Status: Abnormal  Collection Time: 08/07/19  4:50 AM  Result Value Ref Range    WBC 14.3 (H) 4.0 - 10.5 K/uL    RBC 5.02 3.87 - 5.11 MIL/uL    Hemoglobin 13.2 12.0 - 15.0 g/dL    HCT 21.3 08.6 - 57.8 %    MCV 75.7 (L) 80.0 - 100.0 fL    MCH 26.3 26.0 - 34.0 pg    MCHC 34.7 30.0 - 36.0 g/dL    RDW 46.9 62.9 - 52.8 %    Platelets 256 150 - 400 K/uL    nRBC 0.0 0.0 - 0.2 %      Comment: Performed at [facility], [address], [place], [place] [address]  Basic metabolic panel     Status: Abnormal    Collection Time: 08/07/19  4:50 AM  Result Value Ref Range    Sodium 141 135 - 145 mmol/L    Potassium 4.0 3.5 - 5.1 mmol/L    Chloride 108 98 - 111 mmol/L    CO2 25 22 - 32 mmol/L    Glucose, Bld 133 (H) 70 - 99 mg/dL      Comment: Glucose reference range applies only to samples taken after fasting for at least 8 hours.    BUN 28 (H) 6 - 20 mg/dL    Creatinine, Ser 4.01 0.44 - 1.00 mg/dL    Calcium 9.3 8.9 - 02.7 mg/dL    GFR calc non Af Amer >60 >60 mL/min    GFR calc Af Amer >60 >60 mL/min    Anion gap 8 5 - 15      Comment: Performed at [facility], [address] [place], [place] [address]  Iron and TIBC     Status: None    Collection Time: 08/07/19  4:50 AM  Result Value Ref Range    Iron 71 28 - 170 ug/dL    TIBC 440 347 - 425 ug/dL    Saturation Ratios 28 10.4 - 31.8 %    UIBC 182 ug/dL      Comment: Performed at [facility], [address] [place], [place] [address]  Ferritin     Status: None    Collection Time: 08/07/19  4:50 AM  Result Value Ref  Range    Ferritin 36 11 - 307 ng/mL      Comment: Performed at [facility], [address], [place], [place] [address]      Imaging Results (Last 48 hours)  No results found.           Medical Problem List and Plan: 1.  Increasing bilateral lower extremity weakness with spasticity secondary to MS exacerbation.  IV Solu-Medrol completed.  Patient diagnosed 14 with multiple MS exacerbations in the past followed by Dr. Bjorn Loser clinic             -patient may  shower             -ELOS/Goals: 7-10 days, mod I to supervision goals. Has signficant baseline LE weakness.  2.  Antithrombotics: -DVT/anticoagulation: Lovenox             -antiplatelet therapy: N/A 3. Pain Management/history of hereditary and idiopathic neuropathy: Neurontin 100 mg twice daily, baclofen 20 mg 3 times daily 4. Mood: Provide emotional support             -antipsychotic agents: N/A 5. Neuropsych: This patient is capable of making decisions on her own behalf. 6. Skin/Wound Care: Routine skin checks  7. Fluids/Electrolytes/Nutrition: Routine in and outs with follow-up chemistries 8.  Hypertension.  Avapro 150 mg daily.  Monitor with increased mobility  -controlled at present 9.  Neurogenic bladder.  Check PVR x3.    -voiding, sometimes incontinently  -Continue Ditropan 10 mg daily 10.  Tobacco abuse.  Counseling           Mcarthur Rossetti Angiulli, PA-C    I have personally performed a face to face diagnostic evaluation of this patient and formulated the key components of the plan.  Additionally, I have personally reviewed laboratory data, imaging studies, as well as relevant notes and concur with the physician assistant's documentation above.  The patient's status has not changed from the original H&P.  Any changes in documentation from the acute care chart have been noted above.  Ranelle Oyster, MD, [place]

## 2019-08-10 NOTE — Progress Notes (Signed)
[place] PHYSICAL MEDICINE & REHABILITATION PROGRESS NOTE   Subjective/Complaints: Had a good night. Pleased with tolerance of therapy. No pain.   ROS: Patient denies fever, rash, sore throat, blurred vision, nausea, vomiting, diarrhea, cough, shortness of breath or chest pain, joint or back pain, headache, or mood change.    Objective:   No results found. Recent Labs    08/08/19 0452 08/08/19 1442  WBC 12.7* 12.8*  HGB 12.8 14.7  HCT 36.3 42.8  PLT 236 264   Recent Labs    08/08/19 0452 08/08/19 1442  NA 140  --   K 4.5  --   CL 107  --   CO2 25  --   GLUCOSE 126*  --   BUN 30*  --   CREATININE 0.75 0.89  CALCIUM 8.7*  --     Intake/Output Summary (Last 24 hours) at 08/10/2019 0814 Last data filed at 08/09/2019 1834 Gross per 24 hour  Intake 240 ml  Output --  Net 240 ml     Physical Exam: Vital Signs Blood pressure 101/63, pulse (!) 49, temperature 97.6 F (36.4 C), temperature source Oral, resp. rate 18, height 5\' 11"  (1.803 m), weight 122.6 kg, SpO2 98 %. Constitutional: No distress . Vital signs reviewed. obese HEENT: EOMI, oral membranes moist Neck: supple Cardiovascular: RRR without murmur. No JVD    Respiratory/Chest: CTA Bilaterally without wheezes or rales. Normal effort    GI/Abdomen: BS +, non-tender, non-distended Ext: no clubbing, cyanosis, or edema Psych: pleasant and cooperative Musculoskeletal:  General: Normal range of motion.  Cervical back: Normal range of motion.  Neurological: She isalertand oriented to person, place, and time.   Normal CN. UE motor 5/5. LE:   1+ to 2- HF, KE,  1-2/5 ADF/PF with heel cord contractures. Decreased LT/PP in both legs. DTR's trace.  Skin: Skin iswarmand dry. She isnot diaphoretic. Noerythema.   .    Assessment/Plan: 1. Functional deficits secondary to MS exacerbation which require 3+ hours per day of interdisciplinary therapy in a comprehensive inpatient rehab setting.  Physiatrist  is providing close team supervision and 24 hour management of active medical problems listed below.  Physiatrist and rehab team continue to assess barriers to discharge/monitor patient progress toward functional and medical goals  Care Tool:  Bathing    Body parts bathed by patient: Right arm, Left lower leg, Left arm, Chest, Abdomen, Right lower leg, Front perineal area, Right upper leg, Left upper leg, Face   Body parts bathed by helper: Buttocks     Bathing assist Assist Level: Minimal Assistance - Patient > 75%     Upper Body Dressing/Undressing Upper body dressing   What is the patient wearing?: Bra, Pull over shirt    Upper body assist Assist Level: Set up assist    Lower Body Dressing/Undressing Lower body dressing      What is the patient wearing?: Underwear/pull up, Pants     Lower body assist Assist for lower body dressing: Minimal Assistance - Patient > 75%     Toileting Toileting    Toileting assist Assist for toileting: Minimal Assistance - Patient > 75%     Transfers Chair/bed transfer  Transfers assist     Chair/bed transfer assist level: Minimal Assistance - Patient > 75%     Locomotion Ambulation   Ambulation assist      Assist level: Minimal Assistance - Patient > 75% Assistive device: Walker-rolling Max distance: 50 ft   Walk 10 feet activity   Assist  Assist level: Minimal Assistance - Patient > 75% Assistive device: Walker-rolling   Walk 50 feet activity   Assist    Assist level: Minimal Assistance - Patient > 75% Assistive device: Walker-rolling    Walk 150 feet activity   Assist Walk 150 feet activity did not occur: Safety/medical concerns         Walk 10 feet on uneven surface  activity   Assist Walk 10 feet on uneven surfaces activity did not occur: Safety/medical concerns         Wheelchair     Assist Will patient use wheelchair at discharge?: Yes Type of Wheelchair: Manual     Wheelchair assist level: Supervision/Verbal cueing Max wheelchair distance: 125 ft    Wheelchair 50 feet with 2 turns activity    Assist        Assist Level: Supervision/Verbal cueing   Wheelchair 150 feet activity     Assist      Assist Level: Minimal Assistance - Patient > 75%   Blood pressure 101/63, pulse (!) 49, temperature 97.6 F (36.4 C), temperature source Oral, resp. rate 18, height 5\' 11"  (1.803 m), weight 122.6 kg, SpO2 98 %.  Medical Problem List and Plan: 1.Increasing bilateral lower extremity weakness with spasticitysecondary to MS exacerbation. IV Solu-Medrol completed. Patient diagnosed 80 with multiple MS exacerbations in the past followed by Dr. 80 clinic -patient may shower -ELOS/Goals: 7-10 days, mod I to supervision goals. Has signficant baseline LE weakness.   --Continue CIR therapies including PT, OT   -reviewed return to driving considerations today 2. Antithrombotics: -DVT/anticoagulation:Lovenox -antiplatelet therapy: N/A 3. Pain Management/history of hereditary and idiopathic neuropathy:Neurontin 100 mg twice daily, baclofen 20 mg 3 times daily 4. Mood:Provide emotional support -antipsychotic agents: N/A 5. Neuropsych: This patientiscapable of making decisions on herown behalf. 6. Skin/Wound Care:Routine skin checks 7. Fluids/Electrolytes/Nutrition:Routine in and outs with follow-up chemistries 8. Hypertension. Avapro 150 mg daily. Monitor with increased mobility             -remains controlled at present 9. Neurogenic bladder.  .              -seems to be voiding continently now             -Continue Ditropan 10 mg daily 10. Tobacco abuse. Counseling    LOS: 2 days A FACE TO FACE EVALUATION WAS PERFORMED  Bjorn Loser 08/10/2019, 8:14 AM

## 2019-08-11 ENCOUNTER — Inpatient Hospital Stay (HOSPITAL_COMMUNITY): Payer: PPO | Admitting: Occupational Therapy

## 2019-08-11 ENCOUNTER — Inpatient Hospital Stay (HOSPITAL_COMMUNITY): Payer: PPO | Admitting: Physical Therapy

## 2019-08-11 ENCOUNTER — Inpatient Hospital Stay (HOSPITAL_COMMUNITY): Payer: PPO

## 2019-08-11 LAB — CBC WITH DIFFERENTIAL/PLATELET
Abs Immature Granulocytes: 0.11 10*3/uL — ABNORMAL HIGH (ref 0.00–0.07)
Basophils Absolute: 0 10*3/uL (ref 0.0–0.1)
Basophils Relative: 0 %
Eosinophils Absolute: 0.2 10*3/uL (ref 0.0–0.5)
Eosinophils Relative: 2 %
HCT: 38.7 % (ref 36.0–46.0)
Hemoglobin: 13.4 g/dL (ref 12.0–15.0)
Immature Granulocytes: 1 %
Lymphocytes Relative: 24 %
Lymphs Abs: 2.7 10*3/uL (ref 0.7–4.0)
MCH: 26.1 pg (ref 26.0–34.0)
MCHC: 34.6 g/dL (ref 30.0–36.0)
MCV: 75.3 fL — ABNORMAL LOW (ref 80.0–100.0)
Monocytes Absolute: 1 10*3/uL (ref 0.1–1.0)
Monocytes Relative: 9 %
Neutro Abs: 7 10*3/uL (ref 1.7–7.7)
Neutrophils Relative %: 64 %
Platelets: 235 10*3/uL (ref 150–400)
RBC: 5.14 MIL/uL — ABNORMAL HIGH (ref 3.87–5.11)
RDW: 14.1 % (ref 11.5–15.5)
WBC: 11.1 10*3/uL — ABNORMAL HIGH (ref 4.0–10.5)
nRBC: 0 % (ref 0.0–0.2)

## 2019-08-11 LAB — COMPREHENSIVE METABOLIC PANEL
ALT: 55 U/L — ABNORMAL HIGH (ref 0–44)
AST: 22 U/L (ref 15–41)
Albumin: 2.8 g/dL — ABNORMAL LOW (ref 3.5–5.0)
Alkaline Phosphatase: 80 U/L (ref 38–126)
Anion gap: 8 (ref 5–15)
BUN: 28 mg/dL — ABNORMAL HIGH (ref 6–20)
CO2: 29 mmol/L (ref 22–32)
Calcium: 8.4 mg/dL — ABNORMAL LOW (ref 8.9–10.3)
Chloride: 104 mmol/L (ref 98–111)
Creatinine, Ser: 1.01 mg/dL — ABNORMAL HIGH (ref 0.44–1.00)
GFR calc Af Amer: >60 mL/min (ref >60)
GFR calc non Af Amer: >60 mL/min (ref >60)
Glucose, Bld: 90 mg/dL (ref 70–99)
Potassium: 3.9 mmol/L (ref 3.5–5.1)
Sodium: 141 mmol/L (ref 135–145)
Total Bilirubin: 0.4 mg/dL (ref 0.3–1.2)
Total Protein: 5.5 g/dL — ABNORMAL LOW (ref 6.5–8.1)

## 2019-08-11 NOTE — Progress Notes (Signed)
Lisa Williams PHYSICAL MEDICINE & REHABILITATION PROGRESS NOTE   Subjective/Complaints:  Pt reports most of MS exacerbation is weakness- more "weak than anything" no visual disturbances, no pain, usually has BM QOD to Q2 days- is going regularly for her.   Denies feeling constipated.  Voiding well-    ROS:  Pt denies SOB, abd pain, CP, N/V/C/D, and vision changes  Objective:   No results found. Recent Labs    08/11/19 0518  WBC 11.1*  HGB 13.4  HCT 38.7  PLT 235   Recent Labs    08/11/19 0518  NA 141  K 3.9  CL 104  CO2 29  GLUCOSE 90  BUN 28*  CREATININE 1.01*  CALCIUM 8.4*    Intake/Output Summary (Last 24 hours) at 08/11/2019 1601 Last data filed at 08/11/2019 1230 Gross per 24 hour  Intake 592 ml  Output --  Net 592 ml     Physical Exam: Vital Signs Blood pressure (!) 114/57, pulse 66, temperature 97.7 F (36.5 C), temperature source Oral, resp. rate 20, height 5\' 11"  (1.803 m), weight 122.6 kg, SpO2 100 %. Constitutional: sitting up in bed, appropriate, NAD HEENT: oral mucosa moist; conjugate gaze Neck: supple Cardiovascular: RRR- no JVD    Respiratory/Chest: CTA b/L    GI/Abdomen:soft, NT, ND, (+)BS Ext: no clubbing, cyanosis, or edema Psych: pleasant and cooperative Musculoskeletal:  General: Normal range of motion.  Cervical back: Normal range of motion.  Neurological: She isalertand oriented to person, place, and time.   Normal CN. UE motor 5/5. LE:   1+ to 2- HF, KE,  1-2/5- unchanged today  ADF/PF with heel cord contractures. Decreased LT/PP in both legs. DTR's trace.  Skin: Skin iswarmand dry. She isnot diaphoretic. Noerythema.   .    Assessment/Plan: 1. Functional deficits secondary to MS exacerbation which require 3+ hours per day of interdisciplinary therapy in a comprehensive inpatient rehab setting.  Physiatrist is providing close team supervision and 24 hour management of active medical problems listed  below.  Physiatrist and rehab team continue to assess barriers to discharge/monitor patient progress toward functional and medical goals  Care Tool:  Bathing    Body parts bathed by patient: Right arm, Left lower leg, Left arm, Chest, Abdomen, Right lower leg, Front perineal area, Right upper leg, Left upper leg, Face   Body parts bathed by helper: Buttocks     Bathing assist Assist Level: Minimal Assistance - Patient > 75%     Upper Body Dressing/Undressing Upper body dressing   What is the patient wearing?: Bra, Pull over shirt    Upper body assist Assist Level: Set up assist    Lower Body Dressing/Undressing Lower body dressing      What is the patient wearing?: Underwear/pull up, Pants     Lower body assist Assist for lower body dressing: Moderate Assistance - Patient 50 - 74%     Toileting Toileting    Toileting assist Assist for toileting: Maximal Assistance - Patient 25 - 49%     Transfers Chair/bed transfer  Transfers assist     Chair/bed transfer assist level: Moderate Assistance - Patient 50 - 74%     Locomotion Ambulation   Ambulation assist      Assist level: Minimal Assistance - Patient > 75% Assistive device: Walker-rolling Max distance: 50'   Walk 10 feet activity   Assist     Assist level: Minimal Assistance - Patient > 75% Assistive device: Walker-rolling   Walk 50 feet activity  Assist    Assist level: Minimal Assistance - Patient > 75% Assistive device: Walker-rolling    Walk 150 feet activity   Assist Walk 150 feet activity did not occur: Safety/medical concerns         Walk 10 feet on uneven surface  activity   Assist Walk 10 feet on uneven surfaces activity did not occur: Safety/medical concerns         Wheelchair     Assist Will patient use wheelchair at discharge?: Yes Type of Wheelchair: Manual    Wheelchair assist level: Supervision/Verbal cueing Max wheelchair distance: 150'     Wheelchair 50 feet with 2 turns activity    Assist        Assist Level: Supervision/Verbal cueing   Wheelchair 150 feet activity     Assist      Assist Level: Supervision/Verbal cueing   Blood pressure (!) 114/57, pulse 66, temperature 97.7 F (36.5 C), temperature source Oral, resp. rate 20, height 5\' 11"  (1.803 m), weight 122.6 kg, SpO2 100 %.  Medical Problem List and Plan: 1.Increasing bilateral lower extremity weakness with spasticitysecondary to MS exacerbation. IV Solu-Medrol completed. Patient diagnosed 41 with multiple MS exacerbations in the past followed by Dr. Larinda But[facility] clinic -patient may shower -ELOS/Goals: 7-10 days, mod I to supervision goals. Has signficant baseline LE weakness.   --Continue CIR therapies including PT, OT   -reviewed return to driving considerations today 2. Antithrombotics: -DVT/anticoagulation:Lovenox -antiplatelet therapy: N/A 3. Pain Management/history of hereditary and idiopathic neuropathy:Neurontin 100 mg twice daily, baclofen 20 mg 3 times daily  4/5- pain is well controlled- con't meds 4. Mood:Provide emotional support -antipsychotic agents: N/A 5. Neuropsych: This patientiscapable of making decisions on herown behalf. 6. Skin/Wound Care:Routine skin checks 7. Fluids/Electrolytes/Nutrition:Routine in and outs with follow-up chemistries 8. Hypertension. Avapro 150 mg daily. Monitor with increased mobility             -4/5- BP is controlled- con't regimen 9. Neurogenic bladder.  .              -seems to be voiding continently now             -Continue Ditropan 10 mg daily  4/5- voiding well 10. Tobacco abuse. Counseling 11. Constipation/neurogenic bowel  4/5- pt says she normally goes q2-3 days- which is normal for her- denies constipation, but will ned to monitor closely.     LOS: 3 days A FACE TO FACE EVALUATION WAS  PERFORMED  Binh Doten 08/11/2019, 4:01 PM

## 2019-08-11 NOTE — Progress Notes (Signed)
Physical Therapy Session Note  Patient Details  Name: Lisa Williams MRN: [id number] Date of Birth: Apr 10, 1961  Today's Date: 08/11/2019 PT Individual Time: 2863-8177 PT Individual Time Calculation (min): 30 min   Short Term Goals: Week 1:  PT Short Term Goal 1 (Week 1): STG = LTG due to short ELOS.  Skilled Therapeutic Interventions/Progress Updates:    Pt received seated in w/c in room, agreeable to PT session. No complaints of pain. Dependent transport to/from therapy gym via w/c for time and energy conservation. Sit to stand with max A to RW. Attempt to have pt scoot forwards to edge of chair, pt reports she is unable to stand this way and prefers to be scooted all the way back in the chair. Ambulation x 50 ft with RW and min A with w/c follow for safety. Pt exhibits decreased B hip and knee flexion during gait with hip circumduction used to advance LE, decreased LLE clearance with onset of fatigue. Pt exhibits no eccentric control when returning to sitting in w/c and "plops" down into chair. Manual w/c propulsion x 150 ft with use of BUE at Supervision level for global endurance training. Obtained thicker cushion for w/c for increased seat height to assist with transfers. However, pt unable to come to a full stand to RW again due to fatigue. Squat pivot transfer w/c to/from bed with mod A. Once seated in w/c again with higher cushion pt still unable to stand to RW due to LE fatigue. Pt left seated in w/c in room with needs in reach at end of session.  Therapy Documentation Precautions:  Precautions Precautions: None Required Braces or Orthoses: (pt has personal B AFO's) Restrictions Weight Bearing Restrictions: No    Therapy/Group: Individual Therapy   Peter Congo, PT, DPT  08/11/2019, 3:38 PM

## 2019-08-11 NOTE — Progress Notes (Signed)
Occupational Therapy Session Note  Patient Details  Name: Lisa Williams MRN: [id number] Date of Birth: 1960-09-17  Today's Date: 08/11/2019 OT Individual Time: 0803-0900 and 1300-1330 OT Individual Time Calculation (min): 57 min and and 30 mins   Short Term Goals: Week 1:  OT Short Term Goal 1 (Week 1): STG=LTGs due to short ELOS  Skilled Therapeutic Interventions/Progress Updates:    Session 1: Upon entering the room, pt supine in bed with no c/o pain but does report fatigue this morning. Pt required min A for supine > EOB. Pt making multiple attempts to stand from elevated bed and eventually needing mod lifting assistance with min A stand pivot transfer into wheelchair. Pt was unable to stand again in bathroom with assistance and performed lateral scoot transfer from wheelchair > BSC over toilet with mod A. Pt able to void this session and transferred back to wheelchair in same manner. Pt then transferred onto TTB with mod lateral scoot transfer with therapist steady equipment needs. Pt remained seated for bathing task and needing assistance to wash buttocks and B feet. Pt remained in wheelchair for dressing tasks. Pt again making multiple attempts to stand unsuccessfully with becoming frustrated. Figure four position utilized to thread clothing onto feet and pt performing wheelchair push up while therapist pulled pants the rest of the way over buttocks. OT assisted pt with donning AFOs and she was able to thread shoes on and tie R shoe herself. Pt remained seated in wheelchair at end of session. Call bell and all needed items within reach upon exiting the room.   Session 2: Upon entering the room, pt seated in wheelchair with no c/o pain and agreeable to OT intervention. Pt propelled wheelchair 100' with supervision to tub room. Pt providing pictures and description of bathroom set up as this is great concern to her. She will need bariatric TTB secondary to body habitus and needing  balance support while seated. OT also suggested steady strips placed in bottom of tub to reduce risk of fall. Pt returned to room in same manner as above. OT providing pt with paper handout of exercises and given level three resistive band. OT demonstrating B UE strengthening exercises with pt returning a few reps of each secondary to time. OT asked pt to work on exercises tonight and will be reviewed at next session further. Pt remained in wheelchair with call bell and all needed items within reach.   Therapy Documentation Precautions:  Precautions Precautions: None Required Braces or Orthoses: (pt has personal B AFO's) Restrictions Weight Bearing Restrictions: No   Therapy/Group: Individual Therapy  Alen Bleacher 08/11/2019, 12:47 PM

## 2019-08-11 NOTE — Progress Notes (Signed)
Social Work Assessment and Plan   Patient Details  Name: Lisa Williams MRN: [id number] Date of Birth: 1961/04/28  Today's Date: 08/11/2019  Problem List:  Patient Active Problem List   Diagnosis Date Noted  . Abscess of female pelvis   . SVT (supraventricular tachycardia) ([place])   . Radial styloid tenosynovitis 03/12/2018  . Wheelchair confinement 02/27/2018  . Localized osteoporosis with current pathological fracture with routine healing 01/19/2017  . Wrist fracture 01/16/2017  . Sprain of ankle 03/23/2016  . Closed fracture of lateral malleolus 03/16/2016  . Health care maintenance 01/24/2016  . Blood pressure elevated without history of HTN 10/25/2015  . Essential hypertension 10/25/2015  . Multiple sclerosis ([place]) 10/02/2015  . Chronic left shoulder pain 07/19/2015  . Multiple sclerosis exacerbation ([place]) 07/14/2015  . MS (multiple sclerosis) ([place]) 11/26/2014  . Increased body mass index 11/26/2014  . HPV test positive 11/26/2014  . Status post laparoscopic supracervical hysterectomy 11/26/2014  . Galactorrhea 11/26/2014  . Back ache 05/21/2014  . Adiposity 05/21/2014  . Disordered sleep 05/21/2014  . Muscle spasticity 05/21/2014  . Spasticity 05/21/2014  . Calculus of kidney 12/09/2013  . Renal colic 75/02/2584  . Hypercholesteremia 08/19/2013  . Hereditary and idiopathic neuropathy 08/19/2013  . Hypercholesterolemia without hypertriglyceridemia 08/19/2013  . Bladder infection, chronic 07/25/2012  . Disorder of bladder function 07/25/2012  . Incomplete bladder emptying 07/25/2012  . Microscopic hematuria 07/25/2012  . Right upper quadrant pain 07/25/2012   Past Medical History:  Past Medical History:  Diagnosis Date  . Abdominal pain, right upper quadrant   . Back pain   . Calculus of kidney 12/09/2013  . Chronic back pain    unspecified  . Chronic left shoulder pain 07/19/2015  . Functional disorder of bladder    other  . Galactorrhea 11/26/2014   Chronic   . Hereditary and idiopathic neuropathy 08/19/2013  . HPV test positive   . Hypercholesteremia 08/19/2013  . Incomplete bladder emptying   . Microscopic hematuria   . MS (multiple sclerosis) ([place])   . Muscle spasticity 05/21/2014  . Nonspecific findings on examination of urine    other  . Osteopenia   . Status post laparoscopic supracervical hysterectomy 11/26/2014  . Tobacco user 11/26/2014  . Wrist fracture    Past Surgical History:  Past Surgical History:  Procedure Laterality Date  . bilateral tubal ligation  1996  . BREAST CYST EXCISION Left 2002  . KNEE SURGERY     right  . LAPAROSCOPIC SUPRACERVICAL HYSTERECTOMY  08/05/2013  . ORIF WRIST FRACTURE Left 01/17/2017   Procedure: OPEN REDUCTION INTERNAL FIXATION (ORIF) WRIST FRACTURE;  Surgeon: Lovell Sheehan, MD;  Location: [facility] ORS;  Service: Orthopedics;  Laterality: Left;  . TUBAL LIGATION Bilateral   . VAGINAL HYSTERECTOMY  03/2006   Social History:  reports that she has quit smoking. Her smoking use included cigarettes. She smoked 0.50 packs per day. She has never used smokeless tobacco. She reports that she does not drink alcohol or use drugs.  Family / Support Systems Marital Status: Divorced How Long?: More than 5 yers Spouse/Significant Other: N/A Children: N/A Other Supports: Mother, sister, aunts and cousins Anticipated Caregiver: Lives with sister Ability/Limitations of Caregiver: Limited hearing Caregiver Availability: 24/7 Family Dynamics: N/a  Social History Preferred language: English Religion: Christian Cultural Background: Pt worked in IT sales professional: Geophysicist/field seismologist Read: Yes Write: Yes Employment Status: Disabled Public relations account executive Issues: No   Abuse/Neglect Abuse/Neglect Assessment Can Be Completed: Yes Physical Abuse: Denies Verbal Abuse: Denies  Sexual Abuse: Denies Exploitation of patient/patient's resources: Denies Self-Neglect: Denies  Emotional Status Pt's  affect, behavior and adjustment status: N/A Recent Psychosocial Issues: N/A Psychiatric History: N/A Substance Abuse History: N/A  Patient / Family Perceptions, Expectations & Goals Pt/Family understanding of illness & functional limitations: Yes  Recruitment consultant: None Premorbid Home Care/DME Agencies: None Transportation available at discharge: Sister able to transport if pt able to enter sisters cars (Both have SUV's). If not able to get in and out of cars will need medical transport  Discharge Planning Living Arrangements: Other relatives, Parent(Lives with sister) Support Systems: Friends/neighbors, Parent, Other relatives Type of Residence: Private residence(2 Level Home, pt set up down stairs) Insurance Resources: Multimedia programmer (specify)(Health Team Advantage) Financial Resources: SSD Financial Screen Referred: Yes Living Expenses: Lives with family(Pays cable, electricity and provides groceries) Money Management: Patient Does the patient have any problems obtaining your medications?: Yes (Describe) Patient/Family Preliminary Plans: Goal to return home with sister Social Work Anticipated Follow Up Needs: HH/OP([facility]) Expected length of stay: 7-11 Days  Clinical Impression SW met patient, very pleasant and motivated. Introduced self, explained role and discharge process. Patient is not a English as a second language teacher.  Dyanne Iha 08/11/2019, 11:44 AM

## 2019-08-11 NOTE — Care Management (Signed)
Inpatient Rehabilitation Center Individual Statement of Services  Patient Name:  Lisa Williams  Date:  08/11/2019  Welcome to the Inpatient Rehabilitation Center.  Our goal is to provide you with an individualized program based on your diagnosis and situation, designed to meet your specific needs.  With this comprehensive rehabilitation program, you will be expected to participate in at least 3 hours of rehabilitation therapies Monday-Friday, with modified therapy programming on the weekends.  Your rehabilitation program will include the following services:  Physical Therapy (PT), Occupational Therapy (OT), Speech Therapy (ST), 24 hour per day rehabilitation nursing, Therapeutic Recreaction (TR), Neuropsychology, Case Management (Social Worker), Rehabilitation Medicine, Nutrition Services, Pharmacy Services and Other  Weekly team conferences will be held on Wednesdays to discuss your progress.  Your Social Worker will talk with you frequently to get your input and to update you on team discussions.  Team conferences with you and your family in attendance may also be held.  Expected length of stay: 7-10 Days  Overall anticipated outcome: MOD I to Supervision  Depending on your progress and recovery, your program may change. Your Social Worker will coordinate services and will keep you informed of any changes. Your Social Worker's name and contact numbers are listed  below.  The following services may also be recommended but are not provided by the Inpatient Rehabilitation Center:    Home Health Rehabiltiation Services  Outpatient Rehabilitation Services    Arrangements will be made to provide these services after discharge if needed.  Arrangements include referral to agencies that provide these services.  Your insurance has been verified to be: Health Team Advantage Your primary doctor is:  Einar Crow  Pertinent information will be shared with your doctor and your  insurance company.  Social Worker:  Lavera Guise, [place] [phone] or ([phone]    Information discussed with and copy given to patient by: Andria Rhein, 08/11/2019, 11:51 AM

## 2019-08-11 NOTE — Progress Notes (Signed)
Physical Therapy Session Note  Patient Details  Name: Lisa Williams MRN: [id number] Date of Birth: 12-29-1960  Today's Date: 08/11/2019 PT Individual Time: 1000-1056 PT Individual Time Calculation (min): 56 min   Short Term Goals: Week 1:  PT Short Term Goal 1 (Week 1): STG = LTG due to short ELOS.  Skilled Therapeutic Interventions/Progress Updates:   Received pt sitting in WC, pt agreeable to therapy, and denied any pain during session. Session focused on functional mobility/transfers, LE strength, dynamic standing balance/coordination, and improved endurance with activity. Pt transported to gym in [facility] total assist for time management purposes. Pt transferred stand<>pivot WC<>mat with RW min A. Pt transferred sit<>stand with RW from elevated mat x 3 reps and performed standing horseshoe toss x 3 trials without UE support min A. Pt required multiple extended rest breaks throughout session due to increased fatigue. Pt attempted to stand from [facility] to ambulate x 5 trials with RW however was unable to stand completely upright due to increased fatigue, LE weakness, and low surface of chair. Therapist educated pt on importance of "not overdoing it" in terms of therapy and pt agreed. Provided pt with ice pack during session to cool her off. Pt played zoom ball 2x20 while sitting in WC with emphasis on UE strength/coodination. Pt reported 7/10 fatigue at end of session. Pt transported back to room in [facility] total assist. Pt reported urge to use restroom and transferred stand<>pivot WC<>commode with RW min A. Pt able to doff pants with CGA. Concluded session with pt on commode, needs within reach, and instructed pt to call for nursing when done; pt agreed.   Therapy Documentation Precautions:  Precautions Precautions: None Required Braces or Orthoses: (pt has personal B AFO's) Restrictions Weight Bearing Restrictions: No  Therapy/Group: Individual Therapy Martin Majestic PT, DPT    08/11/2019, 7:26 AM

## 2019-08-11 NOTE — Plan of Care (Signed)
  Problem: Consults Goal: RH GENERAL PATIENT EDUCATION Description: See Patient Education module for education specifics. Outcome: Progressing   Problem: RH SAFETY Goal: RH STG ADHERE TO SAFETY PRECAUTIONS W/ASSISTANCE/DEVICE Description: STG Adhere to Safety Precautions With mod I Assistance/Device. Outcome: Progressing   

## 2019-08-11 NOTE — IPOC Note (Signed)
Overall Plan of Care [facility]) Patient Details Name: Lisa Williams MRN: [id number] DOB: Sep 30, 1960  Admitting Diagnosis: Multiple sclerosis exacerbation [facility])  Hospital Problems: Principal Problem:   Multiple sclerosis exacerbation (H[facility]) Active Problems:   MS (multiple sclerosis) ([facility])   Muscle spasticity   Essential hypertension     Functional Problem List: Nursing Endurance, Motor, Safety, Sensory  PT Balance, Endurance, Motor  OT Balance, Endurance, Motor  SLP    TR         Basic ADL's: OT Bathing, Dressing, Toileting     Advanced  ADL's: OT       Transfers: PT Bed to Chair, Bed Mobility, Car  OT Toilet, Tub/Shower     Locomotion: PT Wheelchair Mobility, Ambulation     Additional Impairments: OT None  SLP        TR      Anticipated Outcomes Item Anticipated Outcome  Self Feeding Mod I  Swallowing      Basic self-care  Supervision- Mod I  Engineer, technical sales Transfers Supervision  Bowel/Bladder  Mod I assist  Transfers  supervision with LRAD  Locomotion  supervision gait with RW for household distances, mod I w/c level  Communication     Cognition     Pain  < 3  Safety/Judgment  Mod I assist   Therapy Plan: PT Intensity: Minimum of 1-2 x/day ,45 to 90 minutes PT Frequency: 5 out of 7 days PT Duration Estimated Length of Stay: 1 week OT Intensity: Minimum of 1-2 x/day, 45 to 90 minutes OT Frequency: 5 out of 7 days OT Duration/Estimated Length of Stay: 7-10 days     Due to the current state of emergency, patients may not be receiving their 3-hours of Medicare-mandated therapy.   Team Interventions: Nursing Interventions Patient/Family Education, Disease Management/Prevention, Medication Management, Discharge Planning  PT interventions Ambulation/gait training, Community reintegration, DME/adaptive equipment instruction, Neuromuscular re-education, Psychosocial support, UE/LE Strength taining/ROM, Wheelchair  propulsion/positioning, UE/LE Coordination activities, Therapeutic Activities, Skin care/wound management, Warden/ranger, Discharge planning, Pain management, Functional mobility training, Disease management/prevention, Patient/family education, Splinting/orthotics, Therapeutic Exercise  OT Interventions Balance/vestibular training, Discharge planning, Self Care/advanced ADL retraining, Therapeutic Activities, UE/LE Coordination activities, Functional mobility training, Patient/family education, Therapeutic Exercise, Community reintegration, Neuromuscular re-education, Psychosocial support, UE/LE Strength taining/ROM, Wheelchair propulsion/positioning  SLP Interventions    TR Interventions    SW/CM Interventions Discharge Planning, Psychosocial Support, Patient/Family Education   Barriers to Discharge MD  Medical stability, Home enviroment access/loayout, Incontinence, Neurogenic bowel and bladder, Lack of/limited family support, Weight and Weight bearing restrictions  Nursing      PT      OT      SLP      SW       Team Discharge Planning: Destination: PT-Home ,OT- Home , SLP-  Projected Follow-up: PT-Outpatient PT(unless pt does not have transportation), OT-  Home health OT, SLP-  Projected Equipment Needs: PT-None recommended by PT(pt already has DME), OT- Tub/shower bench, SLP-  Equipment Details: PT- , OT-bariatric TTB; Pt has all other equipment Patient/family involved in discharge planning: PT- Patient,  OT-Patient, SLP-   MD ELOS: 7-10 days Medical Rehab Prognosis:  Good Assessment: Pt is a 59 yr old female with hx of HTN and MS with spasticity admitted with Ms exacerbation to work on LE weakness and mobility/ADLs-  Will also try to work on likely neurogenic bowel and bladder.   Goals mod I to supervision.    See Team Conference Notes for weekly updates  to the plan of care

## 2019-08-12 ENCOUNTER — Inpatient Hospital Stay (HOSPITAL_COMMUNITY): Payer: PPO

## 2019-08-12 ENCOUNTER — Inpatient Hospital Stay (HOSPITAL_COMMUNITY): Payer: PPO | Admitting: Occupational Therapy

## 2019-08-12 ENCOUNTER — Ambulatory Visit: Payer: PPO

## 2019-08-12 MED ORDER — METHYLPREDNISOLONE 4 MG PO TBPK
4.0000 mg | ORAL_TABLET | ORAL | Status: AC
  Administered 2019-08-12: 15:00:00 4 mg via ORAL

## 2019-08-12 MED ORDER — METHYLPREDNISOLONE 4 MG PO TBPK
4.0000 mg | ORAL_TABLET | Freq: Three times a day (TID) | ORAL | Status: AC
  Administered 2019-08-13 (×3): 4 mg via ORAL

## 2019-08-12 MED ORDER — METHYLPREDNISOLONE 4 MG PO TBPK
8.0000 mg | ORAL_TABLET | Freq: Every morning | ORAL | Status: AC
  Administered 2019-08-12: 12:00:00 8 mg via ORAL
  Filled 2019-08-12: qty 21

## 2019-08-12 MED ORDER — METHYLPREDNISOLONE 4 MG PO TBPK
4.0000 mg | ORAL_TABLET | ORAL | Status: AC
  Administered 2019-08-12: 18:00:00 4 mg via ORAL

## 2019-08-12 MED ORDER — METHYLPREDNISOLONE 4 MG PO TBPK
8.0000 mg | ORAL_TABLET | Freq: Every evening | ORAL | Status: AC
  Administered 2019-08-13: 22:00:00 8 mg via ORAL

## 2019-08-12 MED ORDER — METHYLPREDNISOLONE 4 MG PO TBPK
8.0000 mg | ORAL_TABLET | Freq: Every evening | ORAL | Status: AC
  Administered 2019-08-12: 21:00:00 8 mg via ORAL

## 2019-08-12 MED ORDER — METHYLPREDNISOLONE 4 MG PO TBPK
4.0000 mg | ORAL_TABLET | Freq: Four times a day (QID) | ORAL | Status: AC
  Administered 2019-08-14 – 2019-08-16 (×9): 4 mg via ORAL

## 2019-08-12 NOTE — Progress Notes (Signed)
Occupational Therapy Session Note  Patient Details  Name: Lisa Williams MRN: [id number] Date of Birth: June 09, 1960  Today's Date: 08/12/2019 OT Individual Time: 0919-8022 OT Individual Time Calculation (min): 60 min    Short Term Goals: Week 1:  OT Short Term Goal 1 (Week 1): STG=LTGs due to short ELOS  Skilled Therapeutic Interventions/Progress Updates:  Patient met seated in wc in agreement with OT treatment session. Skilled OT services provided with focus on therapeutic exercise and therapeutic activity as detailed below. Wc mobility to ortho gym with supervision A. Sit to stand from wc to RW x2 with Mod A and stand-pivot transfer to elevated mat table with Mod A. Patient notes difficulty with sit to stands from low surfaces secondary to BLE weakness. Patient completed sit to stand x5 with Mod A and education on adequate rest breaks between trials. Patient demonstrates poor pacing and awareness of need for rest breaks this date requiring cueing. Patient ambulated ~5-66f with RW and compensatory strategies secondary to BLE weakness. Arm ergometer for 2 bouts of 3:381m with education on low RPM with focus on endurance. Patient states that RPM of 20-25 with resistance level of 5 was too easy for her insisting on RPM of 30-35. Patient becoming very fatigued with second bout requiring extended rest break prior to return to room. Total assist for transfer back to room secondary to fatigue and time management. Patient indicating need to void. Attempted toilet transfer but patient too fatigued. Patient declined physical assistance for sit to stand transfer stating that it throws off her balance. Patient notes that she is wearing a brief with request for therapist to place towel underneath her bottom to prevent soiling of wc cushion. Patient may benefit from drop arm commode and education on squat-pivot transfers to conserve energy and maintain level of independence. Request for different type of  leg rests with patient noting standard leg rests are difficult to get her legs on and off of.  Patient in agreement with elevating leg rests but patient unable to provide secondary to time constraints. Please provide at next session. Reacher provided per patients request as she uses it to doff/don socks at home. Session concluded with patient seated in wc with call bell within reach and family present.    Therapy Documentation Precautions:  Precautions Precautions: None Required Braces or Orthoses: (pt has personal B AFO's) Restrictions Weight Bearing Restrictions: No  Therapy/Group: Individual Therapy  Lisa Williams 08/12/2019, 7:53 AM

## 2019-08-12 NOTE — Progress Notes (Signed)
Occupational Therapy Session Note  Patient Details  Name: Lisa Williams MRN: [id number] Date of Birth: 03-14-1961  Today's Date: 08/12/2019 OT Individual Time: 0539-7673 OT Individual Time Calculation (min): 56 min    Short Term Goals: Week 1:  OT Short Term Goal 1 (Week 1): STG=LTGs due to short ELOS  Skilled Therapeutic Interventions/Progress Updates:    Treatment session with focus on functional mobility, sit > stand, and dynamic sitting and standing balance during self-care tasks.  Pt received semi-reclined in bed stating hopeful for a better day during therapy this date.  Pt completed rolling and sidelying to sitting with increased time but no physical assist aside for bedrails.  Engaged in bathing seated at EOB with setup for items.  Attempted sit > stand x4 from elevated EOB with RW, pt unable to come to full upright standing or transition LUE from bed to RW despite max assist.  Completed lateral scoot to w/c with mod assist.  Pt then completed sit > stand from w/c with use of bilateral arm rests with mod assist.  Attempted to pull pants over hips while standing, however required physical assistance due to BLE weakness and instability in standing without UE support.  Pt remained upright in w/c to complete UB dressing and grooming tasks at overall setup level.  MD arrived during session, pt and therapist spoke with MD regarding increased BLE weakness and difficulty with sit > stand over last 2 days.  Therapy Documentation Precautions:  Precautions Precautions: None Required Braces or Orthoses: (pt has personal B AFO's) Restrictions Weight Bearing Restrictions: No General:   Vital Signs: Therapy Vitals Temp: 97.8 F (36.6 C) Temp Source: Oral Pulse Rate: 68 Resp: 16 BP: (!) 99/55 Patient Position (if appropriate): Lying Oxygen Therapy SpO2: 97 % O2 Device: Room Air Pain: Pain Assessment Pain Scale: 0-10 Pain Score: 0-No pain   Therapy/Group: Individual  Therapy  Rosalio Loud 08/12/2019, 8:43 AM

## 2019-08-12 NOTE — Plan of Care (Signed)
  Problem: RH Dressing Goal: LTG Patient will perform lower body dressing w/assist (OT) Description: LTG: Patient will perform lower body dressing with assist, with/without cues in positioning using equipment (OT) Flowsheets (Taken 08/12/2019 0844) LTG: Pt will perform lower body dressing with assistance level of: (downgraded due to difficulty with sit > stand) Supervision/Verbal cueing Note: downgraded due to difficulty with sit > stand   Problem: RH Toileting Goal: LTG Patient will perform toileting task (3/3 steps) with assistance level (OT) Description: LTG: Patient will perform toileting task (3/3 steps) with assistance level (OT)  Flowsheets (Taken 08/12/2019 0844) LTG: Pt will perform toileting task (3/3 steps) with assistance level: (downgraded due to difficulty with sit > stand) Supervision/Verbal cueing Note: downgraded due to difficulty with sit > stand   Problem: RH Toilet Transfers Goal: LTG Patient will perform toilet transfers w/assist (OT) Description: LTG: Patient will perform toilet transfers with assist, with/without cues using equipment (OT) Flowsheets (Taken 08/12/2019 0844) LTG: Pt will perform toilet transfers with assistance level of: (downgraded due to difficulty with sit > stand) Supervision/Verbal cueing Note: downgraded due to difficulty with sit > stand

## 2019-08-12 NOTE — Progress Notes (Signed)
Physical Therapy Session Note  Patient Details  Name: Lisa Williams MRN: [id number] Date of Birth: Nov 10, 1960  Today's Date: 08/12/2019 PT Individual Time: 9480-1655 PT Individual Time Calculation (min): 69 min   Short Term Goals: Week 1:  PT Short Term Goal 1 (Week 1): STG = LTG due to short ELOS.  Skilled Therapeutic Interventions/Progress Updates:   Received pt sitting in WC, pt agreeable to therapy, and denied any pain during session. Session focused on functional mobility/transfers, LE strength, dynamic standing balance/coordination, ambulation, and improved activity tolerance. Pt performed WC mobility >139ft using bilateral UEs and supervision to dayroom. Pt transferred stand<>pivot WC<>Nustep with RW min A. Pt required total assist to position LEs on Nustep foot plates. Pt performed bilateral UE/LE strengthening on Nustep at workload 4 for 8 minutes for a total of 415 steps. Therapist provided pt with ice pack to assist with overheating and fatigue throughout session. Pt reported burning sensation after activity and required extensive rest break. Pt attempted to transfer stand<>pivot Nustep<>WC with RW however pt unable to stand upright and transferred lateral scoot with min A. Pt transferred sit<>stand x 2 trials mod A and ambulated 4ft x 2 trials with RW min A. Pt relies on UE strength to swing LEs forward due to decreased LE strength. Pt transferred sit<>stand at table with bilateral UE support, however pt unable to stand completely upright and grip onto table. Pt attempted sit<>stand again using RW with mod A. While standing with RW and table in front, worked on lateral weight shifting reaching outside BOS to stack cones from R side of the table to the L x 5 reps. However, pt with decreased stability and increased bilateral knee flexion and pt returned to sitting position for safety. Pt stated "I felt like my knees were going to buckle". Discussed trying activity in standing frame  during next session for increased stability. Pt performed wheelchair pushups 2 x 11 reps with supervision and rest breaks in between. Pt transported back to room in [facility] total assist. Concluded session with pt sitting in WC, needs within reach, and chair pad alarm on.   Therapy Documentation Precautions:  Precautions Precautions: None Required Braces or Orthoses: (pt has personal B AFO's) Restrictions Weight Bearing Restrictions: No  Therapy/Group: Individual Therapy Martin Majestic PT, DPT   08/12/2019, 7:32 AM

## 2019-08-12 NOTE — Progress Notes (Signed)
[facility] PHYSICAL MEDICINE & REHABILITATION PROGRESS NOTE   Subjective/Complaints: Feeling more weakness in her bilateral lower extremities. Felt she did well with therapy on Friday and Saturday but notes increased weakness yesterday and today. Denies other symptoms such as headache or dizziness.   ROS:  Pt denies SOB, abd pain, CP, N/V/C/D, and vision changes  Objective:   No results found. Recent Labs    08/11/19 0518  WBC 11.1*  HGB 13.4  HCT 38.7  PLT 235   Recent Labs    08/11/19 0518  NA 141  K 3.9  CL 104  CO2 29  GLUCOSE 90  BUN 28*  CREATININE 1.01*  CALCIUM 8.4*    Intake/Output Summary (Last 24 hours) at 08/12/2019 0934 Last data filed at 08/12/2019 0754 Gross per 24 hour  Intake 1072 ml  Output --  Net 1072 ml     Physical Exam: Vital Signs Blood pressure (!) 99/55, pulse 68, temperature 97.8 F (36.6 C), temperature source Oral, resp. rate 16, height 5\' 11"  (1.803 m), weight 122.6 kg, SpO2 97 %. Constitutional: sitting up in bed, appropriate, NAD, sitting in wheelchair comfortably.  HEENT: oral mucosa moist; conjugate gaze Neck: supple Cardiovascular: RRR- no JVD    Respiratory/Chest: CTA b/L    GI/Abdomen:soft, NT, ND, (+)BS Ext: no clubbing, cyanosis, or edema Psych: pleasant and cooperative Musculoskeletal:  General: Normal range of motion.  Cervical back: Normal range of motion.  Neurological: She isalertand oriented to person, place, and time.   Normal CN. UE motor 5/5. LE:   0/5 HF, KE, DF, PF  ADF/PF with heel cord contractures. Decreased LT/PP in both legs. DTR's trace.  Skin: Skin iswarmand dry. She isnot diaphoretic. Noerythema.   . Assessment/Plan: 1. Functional deficits secondary to MS exacerbation which require 3+ hours per day of interdisciplinary therapy in a comprehensive inpatient rehab setting.  Physiatrist is providing close team supervision and 24 hour management of active medical problems listed  below.  Physiatrist and rehab team continue to assess barriers to discharge/monitor patient progress toward functional and medical goals  Care Tool:  Bathing    Body parts bathed by patient: Right arm, Left lower leg, Left arm, Chest, Abdomen, Right lower leg, Front perineal area, Right upper leg, Left upper leg, Face   Body parts bathed by helper: Buttocks     Bathing assist Assist Level: Minimal Assistance - Patient > 75%     Upper Body Dressing/Undressing Upper body dressing   What is the patient wearing?: Bra, Pull over shirt    Upper body assist Assist Level: Set up assist    Lower Body Dressing/Undressing Lower body dressing      What is the patient wearing?: Underwear/pull up, Pants     Lower body assist Assist for lower body dressing: Moderate Assistance - Patient 50 - 74%     Toileting Toileting    Toileting assist Assist for toileting: Maximal Assistance - Patient 25 - 49%     Transfers Chair/bed transfer  Transfers assist     Chair/bed transfer assist level: Moderate Assistance - Patient 50 - 74%     Locomotion Ambulation   Ambulation assist      Assist level: Minimal Assistance - Patient > 75% Assistive device: Walker-rolling Max distance: 50'   Walk 10 feet activity   Assist     Assist level: Minimal Assistance - Patient > 75% Assistive device: Walker-rolling   Walk 50 feet activity   Assist    Assist level: Minimal Assistance -  Patient > 75% Assistive device: Walker-rolling    Walk 150 feet activity   Assist Walk 150 feet activity did not occur: Safety/medical concerns         Walk 10 feet on uneven surface  activity   Assist Walk 10 feet on uneven surfaces activity did not occur: Safety/medical concerns         Wheelchair     Assist Will patient use wheelchair at discharge?: Yes Type of Wheelchair: Manual    Wheelchair assist level: Supervision/Verbal cueing Max wheelchair distance: 100'     Wheelchair 50 feet with 2 turns activity    Assist        Assist Level: Supervision/Verbal cueing   Wheelchair 150 feet activity     Assist      Assist Level: Supervision/Verbal cueing   Blood pressure (!) 99/55, pulse 68, temperature 97.8 F (36.6 C), temperature source Oral, resp. rate 16, height 5\' 11"  (1.803 m), weight 122.6 kg, SpO2 97 %.  Medical Problem List and Plan: 1.Increasing bilateral lower extremity weakness with spasticitysecondary to MS exacerbation. IV Solu-Medrol completed. Patient diagnosed 43 with multiple MS exacerbations in the past followed by Dr. 80 clinic -patient may shower -ELOS/Goals: 7-10 days, mod I to supervision goals. Has signficant baseline LE weakness.   --Continue CIR therapies including PT, OT   -reviewed return to driving considerations today 2. Antithrombotics: -DVT/anticoagulation:Lovenox -antiplatelet therapy: N/A 3. Pain Management/history of hereditary and idiopathic neuropathy:Neurontin 100 mg twice daily, baclofen 20 mg 3 times daily  4/5- pain is well controlled- con't meds 4. Mood:Provide emotional support -antipsychotic agents: N/A 5. Neuropsych: This patientiscapable of making decisions on herown behalf. 6. Skin/Wound Care:Routine skin checks 7. Fluids/Electrolytes/Nutrition:Routine in and outs with follow-up chemistries 8. Hypertension. Avapro 150 mg daily. Monitor with increased mobility             -4/5- BP is controlled- con't regimen 9. Neurogenic bladder.  .              -seems to be voiding continently now             -Continue Ditropan 10 mg daily  4/6- voiding well 10. Tobacco abuse. Counseling 11. Constipation/neurogenic bowel  4/6- pt says she normally goes q2-3 days- which is normal for her- denies constipation, but will ned to monitor closely.  12. Worsening bilateral lower extremity weakness: Patient states  that in past MS exacerbations she received a steroid taper after receiving IV Solumedrol and tolerated this well. Will start steroid taper today. Curbsiding neurology for recommendations as well.     LOS: 4 days A FACE TO FACE EVALUATION WAS PERFORMED  6/6 Valynn Schamberger 08/12/2019, 9:34 AM

## 2019-08-12 NOTE — Progress Notes (Signed)
Patient had a low blood pressure reading this morning. No symptoms noted. Will continue to monitor & pass on to oncoming shift. Communication left for the provider for evaluation.

## 2019-08-12 NOTE — Plan of Care (Signed)
  Problem: RH Car Transfers Goal: LTG Patient will perform car transfers with assist (PT) Description: LTG: Patient will perform car transfers with assistance (PT). Flowsheets (Taken 08/12/2019 1231) LTG: Pt will perform car transfers with assist:: (downgraded due to LE weakness from MS and decreased balance) Contact Guard/Touching assist Note: downgraded due to LE weakness from MS and decreased balance   Problem: RH Ambulation Goal: LTG Patient will ambulate in controlled environment (PT) Description: LTG: Patient will ambulate in a controlled environment, # of feet with assistance (PT). Flowsheets (Taken 08/12/2019 1231) LTG: Pt will ambulate in controlled environ  assist needed:: (downgraded due to LE weakness from MS and dereased balance) Contact Guard/Touching assist LTG: Ambulation distance in controlled environment: 47ft with LRAD Note: downgraded due to LE weakness from MS and decreased balance Goal: LTG Patient will ambulate in home environment (PT) Description: LTG: Patient will ambulate in home environment, # of feet with assistance (PT). Flowsheets (Taken 08/12/2019 1231) LTG: Pt will ambulate in home environ  assist needed:: (downgraded due to LE weakness from MS and decreased balance) Contact Guard/Touching assist LTG: Ambulation distance in home environment: 43ft with LRAD Note: downgraded due to LE weakness from MS and decreased balance

## 2019-08-13 ENCOUNTER — Inpatient Hospital Stay (HOSPITAL_COMMUNITY): Payer: PPO | Admitting: Occupational Therapy

## 2019-08-13 ENCOUNTER — Inpatient Hospital Stay (HOSPITAL_COMMUNITY): Payer: PPO

## 2019-08-13 MED ORDER — IRBESARTAN 75 MG PO TABS
75.0000 mg | ORAL_TABLET | Freq: Every day | ORAL | Status: DC
  Administered 2019-08-14: 75 mg via ORAL
  Filled 2019-08-13: qty 1

## 2019-08-13 MED ORDER — SENNA 8.6 MG PO TABS: 1.0000 | ORAL_TABLET | Freq: Every evening | ORAL | Status: DC | PRN

## 2019-08-13 NOTE — Progress Notes (Signed)
Physical Therapy Session Note  Patient Details  Name: Lisa Williams MRN: [id number] Date of Birth: [birth year]  Today's Date: 08/13/2019 PT Individual Time: 0900-1014 PT Individual Time Calculation (min): 74 min   Short Term Goals: Week 1:  PT Short Term Goal 1 (Week 1): STG = LTG due to short ELOS.  Skilled Therapeutic Interventions/Progress Updates:   Received pt sitting in WC, pt agreeable to therapy, and denied any pain during session. Session focused on functional mobility/transfers, LE strength, dynamic standing balance/coordination, ambulation, and improved endurance with activity. Therapist provided pt with ice pack during session to assist with overheating and increased fatigue. Pt required multiple extended rest breaks throughout session. Pt performed WC mobility 175ft using bilateral UEs and supervision to ortho gym. Pt transferred sit<>stand min A in standing frame. Pt reported feeling faint and slightly SOB while standing in frame and quickly transferred stand<>sitting in WC. Pt transferred sit<>stand min A trial 2 in standing frame, however once standing pt reported feeling claustrophobic and requested to sit. Pt transported to gym in [facility] total assist. Pt ambulated 66ft with RW min A with 4 turns. Pt required extensive rest break after ambulation and reported 4/10 fatigue afterwards. Pt performed the following UE exercises seated in WC with supervision, verbal cues for technique, and increased time for rest breaks: -Bicep curls 2x12 with 3lb dumbbell -Overhead chest press 2x12 with 3lb dumbbell -Trunk rotations with 6lb medicine ball 2x10 bilaterally  -Chest press with 6lb medicine ball 2x10  Pt transferred sit<>stand with RW x 5 trials min A. Pt required multiple extended rest breaks throughout sit<>stand activity due to increased fatigue. Pt transported back to room in [facility] total assist. Concluded session with pt sitting in WC, needs within reach, and chair pad alarm on.    Therapy Documentation Precautions:  Precautions Precautions: None Required Braces or Orthoses: (pt has personal B AFO's) Restrictions Weight Bearing Restrictions: No  Therapy/Group: Individual Therapy Martin Majestic PT, DPT   08/13/2019, 7:32 AM

## 2019-08-13 NOTE — Progress Notes (Signed)
Social Work Patient ID: Lisa Williams, female   DOB: 1960-09-05, 60 y.o.   MRN: [id number] Sw went to give patient team conference updates. Patient d/c date was set for this Saturday 4/10, patient has agreed for therapy to continue to work with her to improve endurance. New d/c date Tuesday 08/19/19.  Will follow up with updates, questions or concerns.

## 2019-08-13 NOTE — Progress Notes (Signed)
Occupational Therapy Session Note  Patient Details  Name: Lisa Williams MRN: [id number] Date of Birth: 1960-11-28  Today's Date: 08/13/2019 OT Individual Time: 1305-1400 OT Individual Time Calculation (min): 55 min    Short Term Goals: Week 1:  OT Short Term Goal 1 (Week 1): STG=LTGs due to short ELOS  Skilled Therapeutic Interventions/Progress Updates:    Treatment session with focus on functional transfers and sit <> stand from various surfaces.  Pt received upright in w/c reporting fatigue but agreeable to therapy session. Pt reports need to toilet.  Completed stand pivot transfer w/c > BSC over toilet with RW and Min assist - Min guard.  Pt with heavy reliance on RW during mobility (reports baseline).  Completed clothing management with min assist to adjust pants over hips post toileting.  Pt unsuccessful with BM, RN plans to assist with disimpaction after session.  Discussed tub/shower transfers in home.  Pt propelled w/c to ADL tubroom without assist.  Therapist demonstrated tub transfer with tub bench, pt reports not wanting to attempt as she is concerned that tub bench is too narrow for her.  Discussed measurements of bariatric tub bench vs standard (plan to attempt to locate bariatric tub bench to allow for practice).  Completed simulated tub/shower transfers from edge of therapy mat with focus on sit > stand from surface without arm rest and narrow spacing.  Pt required 2 attempts prior to standing but able to do with min assist 1st attempt and mod assist 2nd attempt.  Pt reports increased fatigue due to all activity completed this date.  Pt returned to room and left upright in w/c with all needs in reach.  Therapy Documentation Precautions:  Precautions Precautions: None Required Braces or Orthoses: (pt has personal B AFO's) Restrictions Weight Bearing Restrictions: No General:   Vital Signs: Therapy Vitals Temp: 98.1 F (36.7 C) Temp Source: Oral Pulse Rate: (!)  56 Resp: 16 BP: 125/61 Patient Position (if appropriate): Sitting Oxygen Therapy SpO2: 100 % O2 Device: Room Air Pain:  Pt with no c/o pain   Therapy/Group: Individual Therapy  Rosalio Loud 08/13/2019, 3:25 PM

## 2019-08-13 NOTE — Progress Notes (Addendum)
Occupational Therapy Session Note  Patient Details  Name: Lisa Williams MRN: [id number] Date of Birth: 11-09-1960  Today's Date: 08/13/2019 OT Individual Time: 1100-1158 OT Individual Time Calculation (min): 58 min    Short Term Goals: Week 1:  OT Short Term Goal 1 (Week 1): STG=LTGs due to short ELOS  Skilled Therapeutic Interventions/Progress Updates:  Patient met seated in wc in agreement with OT treatment session. Skilled services provided with focus on therapeutic activity and therapeutic exercise as detailed below. Leg rests on wc changed from standard to elevating leg rests for increased comfort and ease with transfers. Wc mobility from room to therapy gym w/o rest break. Education on energy conservation strategies with patient noting use of techniques from attendance at an [facility] a few years ago. Patient reports that she tries to manage her body temperature as mobility becomes difficult when she is over heated. Patient also reports utilization of a daily schedule to prioritize her day with outings occurring in the mornings when the weather is cooler. OT provided education on self-awareness of need for rest breaks during treatment sessions with patient demonstrating understanding with functional activity this date. Patient completed multiple sit to stands and stand-pivot transfers wc <> elevated mat table. Therapeutic exercise with use of green thera-band for 2 sets x 10 reps each of BUE shoulder flex, abduction and elbow flexion with min cues for pacing and form. Connect-4 played in sitting on mat table with incorporation of lateral scooting R <> L to retrieve pieces to increase BUE strength and decrease need for assistance with functional transfers. Total assist for transit back to room to conserve energy. Session concluded with patient seated in wc with call bell within reach and all needs met.   Therapy Documentation Precautions:  Precautions Precautions: None Required  Braces or Orthoses: (pt has personal B AFO's) Restrictions Weight Bearing Restrictions: No   Therapy/Group: Individual Therapy  Cesia Orf R How[place]-Davis 08/13/2019, 8:02 AM

## 2019-08-13 NOTE — Patient Care Conference (Signed)
Inpatient RehabilitationTeam Conference and Plan of Care Update Date: 08/13/2019   Time: 11:55 AM   Patient Name: Lisa Williams      Medical Record Number: [id number]  Date of Birth: [birth year] Sex: Female         Room/Bed: 4M08C/4M08C-02 Payor Info: Payor: [employer] / Plan: [employer] PPO / Product Type: *No Product type* /    Admit Date/Time:  08/08/2019  1:59 PM  Primary Diagnosis:  Multiple sclerosis exacerbation (HCC)  Patient Active Problem List   Diagnosis Date Noted  . Abscess of female pelvis   . SVT (supraventricular tachycardia) (HCC)   . Radial styloid tenosynovitis 03/12/2018  . Wheelchair confinement 02/27/2018  . Localized osteoporosis with current pathological fracture with routine healing 01/19/2017  . Wrist fracture 01/16/2017  . Sprain of ankle 03/23/2016  . Closed fracture of lateral malleolus 03/16/2016  . Health care maintenance 01/24/2016  . Blood pressure elevated without history of HTN 10/25/2015  . Essential hypertension 10/25/2015  . Multiple sclerosis (HCC) 10/02/2015  . Chronic left shoulder pain 07/19/2015  . Multiple sclerosis exacerbation (HCC) 07/14/2015  . MS (multiple sclerosis) (HCC) 11/26/2014  . Increased body mass index 11/26/2014  . HPV test positive 11/26/2014  . Status post laparoscopic supracervical hysterectomy 11/26/2014  . Galactorrhea 11/26/2014  . Back ache 05/21/2014  . Adiposity 05/21/2014  . Disordered sleep 05/21/2014  . Muscle spasticity 05/21/2014  . Spasticity 05/21/2014  . Calculus of kidney 12/09/2013  . Renal colic 12/09/2013  . Hypercholesteremia 08/19/2013  . Hereditary and idiopathic neuropathy 08/19/2013  . Hypercholesterolemia without hypertriglyceridemia 08/19/2013  . Bladder infection, chronic 07/25/2012  . Disorder of bladder function 07/25/2012  . Incomplete bladder emptying 07/25/2012  . Microscopic hematuria 07/25/2012  . Right upper quadrant pain 07/25/2012    Expected  Discharge Date: Expected Discharge Date: 08/16/19  Team Members Present: Physician leading conference: Dr. Sula Soda Care Coodinator Present: Lavera Guise, BSW;Genie Cj Beecher, RN, MSN Nurse Present: Other (comment)(Daniel [place], RN) PT Present: Raechel Chute, PT OT Present: Rosalio Loud, OT SLP Present: Wanda Plump, SLP PPS Coordinator present : Edson Snowball, PT     Current Status/Progress Goal Weekly Team Focus  Bowel/Bladder   continent of bowel & bladder, LBM 4/5  remain continent  monitor & assist as needed   Swallow/Nutrition/ Hydration             ADL's   mod assist lateral scoot transfers, Mod-max assist sit > stand, Min assist bathing, Setup UB dressing, Mod assist LB dressing at sit> stand level  Supervision overall, downgraded toileting and transfers goals to Supervision      Mobility   bed mobility min A, transfers with RW mod A, ambulation 28ft with RW min A, WC mobility 184ft supervision  supervision  functional mobility/transfers, dynamic standing balance/coordination, ambulation, endurance   Communication             Safety/Cognition/ Behavioral Observations            Pain   no c/o pain, has baclofen scheduled  remain pain free  assess & treat as needed   Skin   BLE edema, no skin break down noted  no new areas of skin break down  assess q shift    Rehab Goals Patient on target to meet rehab goals: Yes Rehab Goals Revised: N/A *See Care Plan and progress notes for long and short-term goals.     Barriers to Discharge  Current Status/Progress Possible Resolutions Date Resolved   Nursing  PT  Other (comments)  LE weakness secondary to MS              OT                  SLP                SW                Discharge Planning/Teaching Needs:  Goal to return home with sister      Team Discussion: MD increased weakness, steroid taper started.  RN cont B/B, BM 4/5, BLE edema.  OT mod A lat scoot transfer, mod/max sit to  stand, min A bathing, mod A LB dressing.  PT bed min A, transfers min/mod, amb 54' min A RW, wants to DC home Saturday.   Revisions to Treatment Plan: N/A     Medical Summary Current Status: Weakness has improved with steroid taper, patient is agreeable to discharge date of 4/14, last BM was 3/31 and patient is agreeable to Senna HS prn Weekly Focus/Goal: Start Senna HS PRN, monitor for BM, continue steroid taper, continue intensive therapies, caregiver training, education regarding preventing over-fatigue  Barriers to Discharge: Medical stability   Possible Resolutions to Barriers: Continued intensive therapies, steroid taper, monitoring of constipation, continued HTN management/titration of Avapro   Continued Need for Acute Rehabilitation Level of Care: The patient requires daily medical management by a physician with specialized training in physical medicine and rehabilitation for the following reasons: Direction of a multidisciplinary physical rehabilitation program to maximize functional independence : Yes Medical management of patient stability for increased activity during participation in an intensive rehabilitation regime.: Yes Analysis of laboratory values and/or radiology reports with any subsequent need for medication adjustment and/or medical intervention. : Yes   I attest that I was present, lead the team conference, and concur with the assessment and plan of the team.   Retta Diones 08/13/2019, 7:54 PM   Team conference was held via web/ teleconference due to [place] - 19

## 2019-08-13 NOTE — Progress Notes (Signed)
[facility] PHYSICAL MEDICINE & REHABILITATION PROGRESS NOTE   Subjective/Complaints: Tends to overdo herself in therapy resulting in fatigue afterward. Walked 50+ feet today.  She did do much better with PT today as compared to yesterday.  She feels much stronger today as well.  Has not had a BM since 31st. Willing to try Senna PRN tonight.   ROS:  Pt denies SOB, abd pain, CP, N/V/C/D, and vision changes  Objective:   No results found. Recent Labs    08/11/19 0518  WBC 11.1*  HGB 13.4  HCT 38.7  PLT 235   Recent Labs    08/11/19 0518  NA 141  K 3.9  CL 104  CO2 29  GLUCOSE 90  BUN 28*  CREATININE 1.01*  CALCIUM 8.4*    Intake/Output Summary (Last 24 hours) at 08/13/2019 1153 Last data filed at 08/13/2019 0900 Gross per 24 hour  Intake 350 ml  Output --  Net 350 ml     Physical Exam: Vital Signs Blood pressure (!) 108/51, pulse (!) 51, temperature 98.3 F (36.8 C), temperature source Oral, resp. rate 18, height 5\' 11"  (1.803 m), weight 122.6 kg, SpO2 96 %. Constitutional: sitting up in bed, appropriate, NAD, sitting in wheelchair comfortably.  HEENT: oral mucosa moist; conjugate gaze Neck: supple Cardiovascular: RRR- no JVD    Respiratory/Chest: CTA b/L    GI/Abdomen:soft, NT, ND, (+)BS Ext: no clubbing, cyanosis, or edema Psych: pleasant and cooperative Musculoskeletal:  General: Normal range of motion.  Cervical back: Normal range of motion.  Neurological: She isalertand oriented to person, place, and time.   Normal CN. UE motor 5/5. LE:   3/5 HF, 3/5 KE, DF, PF  ADF/PF with heel cord contractures. Decreased LT/PP in both legs. DTR's trace.  Skin: Skin iswarmand dry. She isnot diaphoretic. Noerythema.   . Assessment/Plan: 1. Functional deficits secondary to MS exacerbation which require 3+ hours per day of interdisciplinary therapy in a comprehensive inpatient rehab setting.  Physiatrist is providing close team supervision and 24 hour  management of active medical problems listed below.  Physiatrist and rehab team continue to assess barriers to discharge/monitor patient progress toward functional and medical goals  Care Tool:  Bathing    Body parts bathed by patient: Right arm, Left lower leg, Left arm, Chest, Abdomen, Right lower leg, Front perineal area, Right upper leg, Left upper leg, Face   Body parts bathed by helper: Buttocks     Bathing assist Assist Level: Minimal Assistance - Patient > 75%     Upper Body Dressing/Undressing Upper body dressing   What is the patient wearing?: Bra, Pull over shirt    Upper body assist Assist Level: Set up assist    Lower Body Dressing/Undressing Lower body dressing      What is the patient wearing?: Underwear/pull up, Pants     Lower body assist Assist for lower body dressing: Moderate Assistance - Patient 50 - 74%     Toileting Toileting    Toileting assist Assist for toileting: Maximal Assistance - Patient 25 - 49%     Transfers Chair/bed transfer  Transfers assist     Chair/bed transfer assist level: Moderate Assistance - Patient 50 - 74%     Locomotion Ambulation   Ambulation assist      Assist level: Minimal Assistance - Patient > 75% Assistive device: Walker-rolling Max distance: 20ft   Walk 10 feet activity   Assist     Assist level: Minimal Assistance - Patient > 75% Assistive  device: Walker-rolling   Walk 50 feet activity   Assist    Assist level: Minimal Assistance - Patient > 75% Assistive device: Walker-rolling    Walk 150 feet activity   Assist Walk 150 feet activity did not occur: Safety/medical concerns         Walk 10 feet on uneven surface  activity   Assist Walk 10 feet on uneven surfaces activity did not occur: Safety/medical concerns         Wheelchair     Assist Will patient use wheelchair at discharge?: Yes Type of Wheelchair: Manual    Wheelchair assist level: Supervision/Verbal  cueing Max wheelchair distance: 149ft    Wheelchair 50 feet with 2 turns activity    Assist        Assist Level: Supervision/Verbal cueing   Wheelchair 150 feet activity     Assist      Assist Level: Supervision/Verbal cueing   Blood pressure (!) 108/51, pulse (!) 51, temperature 98.3 F (36.8 C), temperature source Oral, resp. rate 18, height 5\' 11"  (1.803 m), weight 122.6 kg, SpO2 96 %.  Medical Problem List and Plan: 1.Increasing bilateral lower extremity weakness with spasticitysecondary to MS exacerbation. IV Solu-Medrol completed. Patient diagnosed 34 with multiple MS exacerbations in the past followed by Dr. [facility]inda [facility] clinic -patient may shower -ELOS/Goals: 7-10 days, mod I to supervision goals. Has signficant baseline LE weakness.   --Continue CIR therapies including PT, OT   -reviewed return to driving considerations  Team conference 4/7. DC Tuesday 4/13.  2. Antithrombotics: -DVT/anticoagulation:Lovenox -antiplatelet therapy: N/A 3. Pain Management/history of hereditary and idiopathic neuropathy:Neurontin 100 mg twice daily, baclofen 20 mg 3 times daily  4/7- denies pain 4. Mood:Provide emotional support -antipsychotic agents: N/A 5. Neuropsych: This patientiscapable of making decisions on herown behalf. 6. Skin/Wound Care:Routine skin checks 7. Fluids/Electrolytes/Nutrition:Routine in and outs with follow-up chemistries 8. Hypertension. Avapro 150 mg daily. Monitor with increased mobility             -4/7: Has been hypotensive past couple of days. Will adjust dose of Avapro to 75mg  daily.  9. Neurogenic bladder.  .              -seems to be voiding continently now             -Continue Ditropan 10 mg daily  4/6- voiding well 10. Tobacco abuse. Counseling 11. Constipation/neurogenic bowel  4/6- pt says she normally goes q2-3 days- which is normal for her- denies  constipation, but will ned to monitor closely.   4/7: Added Senna PRN HS as last BM was 31st.  12. Worsening bilateral lower extremity weakness: Patient states that in past MS exacerbations she received a steroid taper after receiving IV Solumedrol and tolerated this well. Will start steroid taper today. [place] neurology for recommendations as well.  4/7: much improved with steroid taper. Advised regarding overfatigue as well.      LOS: 5 days A FACE TO FACE EVALUATION WAS PERFORMED  Clide Deutscher Jeannett Dekoning 08/13/2019, 11:53 AM

## 2019-08-14 ENCOUNTER — Inpatient Hospital Stay (HOSPITAL_COMMUNITY): Payer: PPO

## 2019-08-14 ENCOUNTER — Inpatient Hospital Stay (HOSPITAL_COMMUNITY): Payer: PPO | Admitting: *Deleted

## 2019-08-14 ENCOUNTER — Ambulatory Visit: Payer: PPO

## 2019-08-14 ENCOUNTER — Inpatient Hospital Stay (HOSPITAL_COMMUNITY): Payer: PPO | Admitting: Occupational Therapy

## 2019-08-14 MED ORDER — IRBESARTAN 75 MG PO TABS
37.5000 mg | ORAL_TABLET | Freq: Every day | ORAL | Status: DC
  Administered 2019-08-15: 37.5 mg via ORAL
  Filled 2019-08-14: qty 0.5

## 2019-08-14 NOTE — Progress Notes (Signed)
[facility] PHYSICAL MEDICINE & REHABILITATION PROGRESS NOTE   Subjective/Complaints:  Got self dressed this AM- feeling good- just had a BM- was feeling constipated, but not anymore.      ROS:   Pt denies SOB, abd pain, CP, N/V/C/D, and vision changes   Objective:   No results found. No results for input(s): WBC, HGB, HCT, PLT in the last 72 hours. No results for input(s): NA, K, CL, CO2, GLUCOSE, BUN, CREATININE, CALCIUM in the last 72 hours.  Intake/Output Summary (Last 24 hours) at 08/14/2019 1025 Last data filed at 08/14/2019 0720 Gross per 24 hour  Intake 800 ml  Output --  Net 800 ml     Physical Exam: Vital Signs Blood pressure (!) 103/59, pulse (!) 55, temperature 98.5 F (36.9 C), temperature source Oral, resp. rate 18, height 5\' 11"  (1.803 m), weight 122.6 kg, SpO2 97 %. Constitutional:  Awake, alert, appropriate, sitting up in manual w/c, with CNA at bedside, NAD HEENT: oral mucosa moist; conjugate gaze Neck: supple Cardiovascular: RRR_ no JVD  Respiratory/Chest: CTA B/L- good air movement    GI/Abdomen:soft, NT, ND, (+)BS Ext: no clubbing, cyanosis, or edema Psych: appropriate Musculoskeletal:  General: Normal range of motion.  Cervical back: Normal range of motion.  Neurological: She isalertand oriented to person, place, and time.   Normal CN. UE motor 5/5. LE:   3/5 HF, 3/5 KE, DF, PF  ADF/PF with heel cord contractures. Decreased LT/PP in both legs. DTR's trace.  Skin: skin warm, dry- no skin breakdown seen.   . Assessment/Plan: 1. Functional deficits secondary to MS exacerbation which require 3+ hours per day of interdisciplinary therapy in a comprehensive inpatient rehab setting.  Physiatrist is providing close team supervision and 24 hour management of active medical problems listed below.  Physiatrist and rehab team continue to assess barriers to discharge/monitor patient progress toward functional and medical goals  Care  Tool:  Bathing    Body parts bathed by patient: Right arm, Left lower leg, Left arm, Chest, Abdomen, Right lower leg, Front perineal area, Right upper leg, Left upper leg, Face   Body parts bathed by helper: Buttocks     Bathing assist Assist Level: Minimal Assistance - Patient > 75%     Upper Body Dressing/Undressing Upper body dressing   What is the patient wearing?: Bra, Pull over shirt    Upper body assist Assist Level: Set up assist    Lower Body Dressing/Undressing Lower body dressing      What is the patient wearing?: Underwear/pull up, Pants     Lower body assist Assist for lower body dressing: Moderate Assistance - Patient 50 - 74%     Toileting Toileting    Toileting assist Assist for toileting: Minimal Assistance - Patient > 75%     Transfers Chair/bed transfer  Transfers assist     Chair/bed transfer assist level: Moderate Assistance - Patient 50 - 74%     Locomotion Ambulation   Ambulation assist      Assist level: Minimal Assistance - Patient > 75% Assistive device: Walker-rolling Max distance: 5ft   Walk 10 feet activity   Assist     Assist level: Minimal Assistance - Patient > 75% Assistive device: Walker-rolling   Walk 50 feet activity   Assist    Assist level: Minimal Assistance - Patient > 75% Assistive device: Walker-rolling    Walk 150 feet activity   Assist Walk 150 feet activity did not occur: Safety/medical concerns  Walk 10 feet on uneven surface  activity   Assist Walk 10 feet on uneven surfaces activity did not occur: Safety/medical concerns         Wheelchair     Assist Will patient use wheelchair at discharge?: Yes Type of Wheelchair: Manual    Wheelchair assist level: Supervision/Verbal cueing Max wheelchair distance: 19ft    Wheelchair 50 feet with 2 turns activity    Assist        Assist Level: Supervision/Verbal cueing   Wheelchair 150 feet activity      Assist      Assist Level: Supervision/Verbal cueing   Blood pressure (!) 103/59, pulse (!) 55, temperature 98.5 F (36.9 C), temperature source Oral, resp. rate 18, height 5\' 11"  (1.803 m), weight 122.6 kg, SpO2 97 %.  Medical Problem List and Plan: 1.Increasing bilateral lower extremity weakness with spasticitysecondary to MS exacerbation. IV Solu-Medrol completed. Patient diagnosed 12 with multiple MS exacerbations in the past followed by Dr. 80 clinic -patient may shower -ELOS/Goals: 7-10 days, mod I to supervision goals. Has signficant baseline LE weakness.   --Continue CIR therapies including PT, OT   -reviewed return to driving considerations  Team conference 4/7. DC Tuesday 4/13.  2. Antithrombotics: -DVT/anticoagulation:Lovenox -antiplatelet therapy: N/A 3. Pain Management/history of hereditary and idiopathic neuropathy:Neurontin 100 mg twice daily, baclofen 20 mg 3 times daily  4/7- denies pain 4. Mood:Provide emotional support -antipsychotic agents: N/A 5. Neuropsych: This patientiscapable of making decisions on herown behalf. 6. Skin/Wound Care:Routine skin checks 7. Fluids/Electrolytes/Nutrition:Routine in and outs with follow-up chemistries 8. Hypertension. Avapro 150 mg daily. Monitor with increased mobility             -4/7: Has been hypotensive past couple of days. Will adjust dose of Avapro to 75mg  daily.  4/8- soft BP 103/59-will reduce to a 1/2 of pill if possible- since BP so low.   9. Neurogenic bladder.  .              -seems to be voiding continently now             -Continue Ditropan 10 mg daily  4/8- voiding well- no complaints 10. Tobacco abuse. Counseling 11. Constipation/neurogenic bowel  4/6- pt says she normally goes q2-3 days- which is normal for her- denies constipation, but will ned to monitor closely.   4/7: Added Senna PRN HS as last BM was 31st.    4/8- Had good BM- feeling good- denies additional constipation 12. Worsening bilateral lower extremity weakness: Patient states that in past MS exacerbations she received a steroid taper after receiving IV Solumedrol and tolerated this well. Will start steroid taper today. [place] neurology for recommendations as well.  4/7: much improved with steroid taper. Advised regarding overfatigue as well.      LOS: 6 days A FACE TO FACE EVALUATION WAS PERFORMED  Lisa Williams 08/14/2019, 10:25 AM

## 2019-08-14 NOTE — Evaluation (Signed)
Recreational Therapy Assessment and Plan  Patient Details  Name: Lisa Williams MRN: [id number] Date of Birth: 1960/10/25 Today's Date: 08/14/2019  Rehab Potential:Good  ELOS:  discharge 4/10   Assessment Problem List:      Patient Active Problem List   Diagnosis Date Noted  . Abscess of female pelvis   . SVT (supraventricular tachycardia) ([place])   . Radial styloid tenosynovitis 03/12/2018  . Wheelchair confinement 02/27/2018  . Localized osteoporosis with current pathological fracture with routine healing 01/19/2017  . Wrist fracture 01/16/2017  . Sprain of ankle 03/23/2016  . Closed fracture of lateral malleolus 03/16/2016  . Health care maintenance 01/24/2016  . Blood pressure elevated without history of HTN 10/25/2015  . Essential hypertension 10/25/2015  . Multiple sclerosis ([place]) 10/02/2015  . Chronic left shoulder pain 07/19/2015  . Multiple sclerosis exacerbation ([place]) 07/14/2015  . MS (multiple sclerosis) ([place]) 11/26/2014  . Increased body mass index 11/26/2014  . HPV test positive 11/26/2014  . Status post laparoscopic supracervical hysterectomy 11/26/2014  . Galactorrhea 11/26/2014  . Back ache 05/21/2014  . Adiposity 05/21/2014  . Disordered sleep 05/21/2014  . Muscle spasticity 05/21/2014  . Spasticity 05/21/2014  . Calculus of kidney 12/09/2013  . Renal colic 86/76/1950  . Hypercholesteremia 08/19/2013  . Hereditary and idiopathic neuropathy 08/19/2013  . Hypercholesterolemia without hypertriglyceridemia 08/19/2013  . Bladder infection, chronic 07/25/2012  . Disorder of bladder function 07/25/2012  . Incomplete bladder emptying 07/25/2012  . Microscopic hematuria 07/25/2012  . Right upper quadrant pain 07/25/2012    Past Medical History:      Past Medical History:  Diagnosis Date  . Abdominal pain, right upper quadrant   . Back pain   . Calculus of kidney 12/09/2013  . Chronic back pain    unspecified  . Chronic left shoulder  pain 07/19/2015  . Functional disorder of bladder    other  . Galactorrhea 11/26/2014   Chronic   . Hereditary and idiopathic neuropathy 08/19/2013  . HPV test positive   . Hypercholesteremia 08/19/2013  . Incomplete bladder emptying   . Microscopic hematuria   . MS (multiple sclerosis) ([place])   . Muscle spasticity 05/21/2014  . Nonspecific findings on examination of urine    other  . Osteopenia   . Status post laparoscopic supracervical hysterectomy 11/26/2014  . Tobacco user 11/26/2014  . Wrist fracture    Past Surgical History:  Past Surgical History:  Procedure Laterality Date  . bilateral tubal ligation  1996  . BREAST CYST EXCISION Left 2002  . KNEE SURGERY     right  . LAPAROSCOPIC SUPRACERVICAL HYSTERECTOMY  08/05/2013  . ORIF WRIST FRACTURE Left 01/17/2017   Procedure: OPEN REDUCTION INTERNAL FIXATION (ORIF) WRIST FRACTURE;  Surgeon: Lovell Sheehan, MD;  Location: [facility] ORS;  Service: Orthopedics;  Laterality: Left;  . TUBAL LIGATION Bilateral   . VAGINAL HYSTERECTOMY  03/2006    Assessment & Plan Clinical Impression: Patient is a 59 y.o. year old female with recent admission to the hospital on Airen R. [name] a 59 year old right-handed African-American female with history ofmultiple sclerosis diagnosed 1995 with multiple exacerbations followed by Dr. Larinda [facility] clinic,hypertension, hereditary and idiopathic neuropathy, HPV chronic back pain, tobacco abuse, hyperlipidemia and neurogenic bladder.Per chart review patient independent with assistive device prior to admissionfor ambulating short distances and uses a manual wheelchair at homeand was receiving some outpatient therapies.She does drive with hand controls. Presented 08/03/2019 with increasing bilateral lower extremity weakness and decrease in sensation. Patient had just received  her Covid vaccine the day prior. Admission chemistries unremarkable, SARS coronavirus negative  urinalysis negative nitrite. Neurology follow-up suspect MS exacerbation placed on 5 days IV Solu-Medrol. Subcutaneous Lovenox for DVT prophylaxis. Therapy evaluations completed and patient was admitted for a comprehensive rehab program tolerating a regular diet.  Patient transferred to CIR on 08/08/2019 .   Met with pt today to discuss TR services, activity analysis with modifications, relaxation strategies, energy conservation and community pursuits.  Pt able to verbalize understanding of all of the above and is willing and motivated to regain further independence.    Plan No further TR as pt is expected to discharge 4/10  Recommendations for other services: None   Discharge Criteria: Patient will be discharged from TR if patient refuses treatment 3 consecutive times without medical reason.  If treatment goals not met, if there is a change in medical status, if patient makes no progress towards goals or if patient is discharged from hospital.  The above assessment, treatment plan, treatment alternatives and goals were discussed and mutually agreed upon: by patient  Bruno 08/14/2019, 4:23 PM

## 2019-08-14 NOTE — Plan of Care (Signed)
  Problem: Consults Goal: RH GENERAL PATIENT EDUCATION Description: See Patient Education module for education specifics. Outcome: Progressing   Problem: RH SAFETY Goal: RH STG ADHERE TO SAFETY PRECAUTIONS W/ASSISTANCE/DEVICE Description: STG Adhere to Safety Precautions With mod I Assistance/Device. Outcome: Progressing

## 2019-08-14 NOTE — Progress Notes (Signed)
Occupational Therapy Session Note  Patient Details  Name: Lisa Williams MRN: [id number] Date of Birth: [birth year]  Today's Date: 08/14/2019 OT Individual Time: 1415-1530 OT Individual Time Calculation (min): 75 min    Short Term Goals: Week 1:  OT Short Term Goal 1 (Week 1): STG=LTGs due to short ELOS  Skilled Therapeutic Interventions/Progress Updates:  Patient met seated in wc in agreement with OT treatment session. Skilled OT services provided with focus on therapeutic exercise and therapeutic activity as detailed below. Patient requesting to go outside this session. Community wc mobility through doorways, around obstacles, in elevators, and on uneven surfaces outside with supervision A. Patient completed therapeutic exercise with focus on BUE strengthening in prep for functional transfers, ambulation, and self-care tasks. Patient demonstrates good return demonstration of energy conservation strategies taking adequate rest breaks throughout. Session concluded with patient seated in wc with all needs met.   Zoom ball for shoulder scaption, shoulder abduction, and shoulder flexion 1x10 reps Wood choppers with 2lb weighted ball 2x15 reps  Wc push-ups 2 x12 reps  Therapy Documentation Precautions:  Precautions Precautions: None Required Braces or Orthoses: (pt has personal B AFO's) Restrictions Weight Bearing Restrictions: No  Therapy/Group: Individual Therapy  [place] R Howerton-Davis 08/14/2019, 4:34 PM

## 2019-08-14 NOTE — Progress Notes (Signed)
Physical Therapy Session Note  Patient Details  Name: Lisa Williams MRN: [id number] Date of Birth: 10/13/1960  Today's Date: 08/14/2019 PT Individual Time: 7322-0254 and 1000-1056 PT Individual Time Calculation (min): 58 min and 56 min  Short Term Goals: Week 1:  PT Short Term Goal 1 (Week 1): STG = LTG due to short ELOS.  Skilled Therapeutic Interventions/Progress Updates:   Treatment Session 1: 0800-0858 58 min Received pt sitting in WC, pt agreeable to therapy, and denied any pain during session. Session focused on functional mobility/transfers, LE strength, dynamic standing balance/coordination, and improved activity tolerance. Pt continues to require extensive rest breaks throughout session due to increased fatigue and overheating. No ice packs available for pt to use during session; however pt took extensive rest breaks for energy conservation. Pt performed WC mobility 130ft using bilateral UEs and supervision to ortho gym. Pt ambulated 21ft x 2 trials with RW min A to car to perform simulated car transfer. However, once standing at car pt reported "I can't do this today". Pt requested to try car transfer again another time. Pt performed WC mobility additional 156ft using bilateral UEs and supervision to gym and transferred stand<>pivot WC<>mat with RW min A. Pt transferred sit<>stand with RW min A x 6 trials. While standing pt worked on dynamic standing balance and reaching outside BOS hooking/unhooking clothespins to basketball net with min A/CGA. Pt worked on dynamic sitting balance, core strength, and UE strength bouncing ball against rebounder 3x20 with supervision. Pt reported 3/10 fatigue after activity and required multiple rest breaks throughout session for energy conservation. Pt transferred mat<>WC stand<>pivot with RW min A. Pt transported back to room in [facility] total assist. Concluded session with pt sitting in WC, needs within reach, and chair pad alarm on.   Treatment Session  2: 1000-1056 56 min Received pt sitting in WC, pt agreeable to therapy, and denied any pain during session. Session focused on functional mobility/transfers, LE strength, dynamic standing balance/coordination, ambulation, and improved activity tolerance. Pt performed WC mobility 154ft using bilateral UEs and supervision to therapy gym. Pt ambulated 44ft with RW min A. Pt required verbal cues for energy conservation strategies and control with movement. Pt required extensive rest breaks in between activities due to increased fatigue. Pt performed WC mobility additional 131ft with bilateral UEs and supervision to dayroom. Pt transferred stand<>pivot WC<>mat with RW min A. Pt transferred sit<>stand with RW x 3 trials with min A from elevated mat. While standing worked on dynamic standing balance playing connect 4 with CGA/min A for balance. Pt declined the Nustep today stating "my legs are too tired", however pt agreeable to UE strengthening exercises. Pt performed the following exercises sitting on mat with supervision and verbal cues for technique: -Isometric shoulder extensions into physioball at midline x12 and at diagonal angle x 10 reps with 5 second isometric hold -Isometric shoulder adduction holding physioball with therapist providing perturbations 2x20 -Active assisted shoulder flexion rolling physioball diagonally x10 reps bilaterally Pt transferred mat<>WC stand<>pivot with RW min A. Pt transported back to room in [facility] total assist. Concluded session with pt sitting in [facility], needs within reach, and chair pad alarm not on however pt about to call nursing to assist her to bathroom.   Therapy Documentation Precautions:  Precautions Precautions: None Required Braces or Orthoses: (pt has personal B AFO's) Restrictions Weight Bearing Restrictions: No   Therapy/Group: Individual Therapy Martin Majestic PT, DPT   08/14/2019, 7:24 AM

## 2019-08-15 ENCOUNTER — Inpatient Hospital Stay (HOSPITAL_COMMUNITY): Payer: PPO

## 2019-08-15 ENCOUNTER — Inpatient Hospital Stay (HOSPITAL_COMMUNITY): Payer: PPO | Admitting: Occupational Therapy

## 2019-08-15 LAB — CREATININE, SERUM
Creatinine, Ser: 0.78 mg/dL (ref 0.44–1.00)
GFR calc Af Amer: >60 mL/min (ref >60)
GFR calc non Af Amer: >60 mL/min (ref >60)

## 2019-08-15 NOTE — Progress Notes (Signed)
[place] PHYSICAL MEDICINE & REHABILITATION PROGRESS NOTE   Subjective/Complaints:  Pt reports wants shower/bath- feeling good- just had BM just now. Denies pain; denies HA and more weakness.     ROS:   Pt denies SOB, abd pain, CP, N/V/C/D, and vision changes    Objective:   No results found. No results for input(s): WBC, HGB, HCT, PLT in the last 72 hours. Recent Labs    08/15/19 0534  CREATININE 0.78    Intake/Output Summary (Last 24 hours) at 08/15/2019 0952 Last data filed at 08/15/2019 0902 Gross per 24 hour  Intake 888 ml  Output --  Net 888 ml     Physical Exam: Vital Signs Blood pressure 109/67, pulse (!) 46, temperature 97.9 F (36.6 C), temperature source Oral, resp. rate 18, height 5\' 11"  (1.803 m), weight 122.6 kg, SpO2 97 %. Constitutional: awake, sitting up in bed; appropriate, wants to get dressed, NAD HEENT: conjugate gaze Neck: supple Cardiovascular: bradycardic regular rhythm Respiratory/Chest: CTA B/L    GI/Abdomen:soft, NT, ND, (+) hypoactive Ext: no clubbing, cyanosis, or edema Psych: appropriate Musculoskeletal:  General: Normal range of motion.  Cervical back: Normal range of motion.  Neurological: She isalertand oriented to person, place, and time.   Normal CN. UE motor 5/5. LE:   3/5 HF, 3/5 KE, DF, PF- strength unchanged today on exa 4/9  ADF/PF with heel cord contractures. Decreased LT/PP in both legs. DTR's trace.  Skin: skin warm, dry- no skin breakdown seen.   . Assessment/Plan: 1. Functional deficits secondary to MS exacerbation which require 3+ hours per day of interdisciplinary therapy in a comprehensive inpatient rehab setting.  Physiatrist is providing close team supervision and 24 hour management of active medical problems listed below.  Physiatrist and rehab team continue to assess barriers to discharge/monitor patient progress toward functional and medical goals  Care Tool:  Bathing    Body parts bathed  by patient: Right arm, Left lower leg, Left arm, Chest, Abdomen, Right lower leg, Front perineal area, Right upper leg, Left upper leg, Face   Body parts bathed by helper: Buttocks     Bathing assist Assist Level: Minimal Assistance - Patient > 75%     Upper Body Dressing/Undressing Upper body dressing   What is the patient wearing?: Bra, Pull over shirt    Upper body assist Assist Level: Set up assist    Lower Body Dressing/Undressing Lower body dressing      What is the patient wearing?: Underwear/pull up, Pants     Lower body assist Assist for lower body dressing: Moderate Assistance - Patient 50 - 74%     Toileting Toileting    Toileting assist Assist for toileting: Minimal Assistance - Patient > 75%     Transfers Chair/bed transfer  Transfers assist     Chair/bed transfer assist level: Minimal Assistance - Patient > 75%     Locomotion Ambulation   Ambulation assist      Assist level: Minimal Assistance - Patient > 75% Assistive device: Walker-rolling Max distance: 12ft   Walk 10 feet activity   Assist     Assist level: Minimal Assistance - Patient > 75% Assistive device: Walker-rolling   Walk 50 feet activity   Assist    Assist level: Minimal Assistance - Patient > 75% Assistive device: Walker-rolling    Walk 150 feet activity   Assist Walk 150 feet activity did not occur: Safety/medical concerns         Walk 10 feet on uneven surface  activity   Assist Walk 10 feet on uneven surfaces activity did not occur: Safety/medical concerns         Wheelchair     Assist Will patient use wheelchair at discharge?: Yes Type of Wheelchair: Manual    Wheelchair assist level: Supervision/Verbal cueing Max wheelchair distance: 16ft    Wheelchair 50 feet with 2 turns activity    Assist        Assist Level: Supervision/Verbal cueing   Wheelchair 150 feet activity     Assist      Assist Level:  Supervision/Verbal cueing   Blood pressure 109/67, pulse (!) 46, temperature 97.9 F (36.6 C), temperature source Oral, resp. rate 18, height 5\' 11"  (1.803 m), weight 122.6 kg, SpO2 97 %.  Medical Problem List and Plan: 1.Increasing bilateral lower extremity weakness with spasticitysecondary to MS exacerbation. IV Solu-Medrol completed. Patient diagnosed 53 with multiple MS exacerbations in the past followed by Dr. [facility] -patient may shower -ELOS/Goals: 7-10 days, mod I to supervision goals. Has signficant baseline LE weakness.   --Continue CIR therapies including PT, OT   -reviewed return to driving considerations  Team conference 4/7. DC Tuesday 4/13.  2. Antithrombotics: -DVT/anticoagulation:Lovenox -antiplatelet therapy: N/A 3. Pain Management/history of hereditary and idiopathic neuropathy:Neurontin 100 mg twice daily, baclofen 20 mg 3 times daily  4/7- denies pain 4. Mood:Provide emotional support -antipsychotic agents: N/A 5. Neuropsych: This patientiscapable of making decisions on herown behalf. 6. Skin/Wound Care:Routine skin checks 7. Fluids/Electrolytes/Nutrition:Routine in and outs with follow-up chemistries 8. Hypertension. Avapro 150 mg daily. Monitor with increased mobility             -4/7: Has been hypotensive past couple of days. Will adjust dose of Avapro to 75mg  daily.  4/8- soft BP 103/59-will reduce to a 1/2 of pill if possible- since BP so low.    4/9- BG still soft 108/50s- pulse 46! will stop Avapro for now and monitor 9. Neurogenic bladder.  .              -seems to be voiding continently now             -Continue Ditropan 10 mg daily  4/8- voiding well- no complaints 10. Tobacco abuse. Counseling 11. Constipation/neurogenic bowel  4/6- pt says she normally goes q2-3 days- which is normal for her- denies constipation, but will ned to monitor closely.   4/9- LBM  this AM- con't regimen 12. Worsening bilateral lower extremity weakness: Patient states that in past MS exacerbations she received a steroid taper after receiving IV Solumedrol and tolerated this well. Will start steroid taper today. [place] for recommendations as well.  4/9- no additional weakness/fatigue- doing well    LOS: 7 days A FACE TO FACE EVALUATION WAS PERFORMED  Bradyn Soward 08/15/2019, 9:52 AM

## 2019-08-15 NOTE — Progress Notes (Signed)
Occupational Therapy Session Note  Patient Details  Name: Shequila Neglia Fesler MRN: [id number] Date of Birth: [birth year]  Today's Date: 08/15/2019 OT Individual Time: 3790-2409 OT Individual Time Calculation (min): 56 min    Short Term Goals: Week 1:  OT Short Term Goal 1 (Week 1): STG=LTGs due to short ELOS  Skilled Therapeutic Interventions/Progress Updates:    Treatment session with focus on activity tolerance and education on energy conservation.  Pt received upright in w/c rehashing all she had done today during previous therapy sessions.  Pt reports legs were fatigued and requested to defer any standing or ambulation.  Pt agreeable to going outside.  Pt propelled w/c through hallways, on/off elevators, and through doorways without assist.  Engaged in BUE and core strengthening with theraband while outside in the shade.  Pt able to recall previously provided exercises.  Engaged in education regarding energy conservation strategies, provided handouts and noted specific strategies that had worked for pt in the past and how to expand upon those strategies.  Pt reports getting overheated and requested to return to room. Discussed good awareness of overheating and strategies to decrease onset.  Pt has good strategies in place and reports becoming more aware through therapy sessions of when/how to build in rest breaks.  Pt pleased with progress.  Therapy Documentation Precautions:  Precautions Precautions: None Required Braces or Orthoses: (pt has personal B AFO's) Restrictions Weight Bearing Restrictions: No General:   Vital Signs: Therapy Vitals Temp: 97.8 F (36.6 C) Temp Source: Oral Pulse Rate: 67 Resp: 18 BP: (!) 142/87 Patient Position (if appropriate): Sitting Oxygen Therapy SpO2: 99 % O2 Device: Room Air Pain:  Pt with no c/o pain   Therapy/Group: Individual Therapy  Rosalio Loud 08/15/2019, 3:26 PM

## 2019-08-15 NOTE — Progress Notes (Signed)
Physical Therapy Session Note  Patient Details  Name: Orion Vandervort Vespa MRN: [id number] Date of Birth: 18-Dec-1960  Today's Date: 08/15/2019 PT Individual Time: 0800-0857 PT Individual Time Calculation (min): 57 min   Short Term Goals: Week 1:  PT Short Term Goal 1 (Week 1): STG = LTG due to short ELOS.  Skilled Therapeutic Interventions/Progress Updates:   Received pt sitting EOB getting dressed, pt agreeable to therapy, and denied any pain during session. Session focused on dressing, functional mobility/transfers, LE strength, dynamic standing balance/coordination, ambulation, simulated car transfer, and improved activity tolerance. Pt required multiple rest breaks throughout session due to increased fatigue. Pt demonstrates improvements with energy conservation strategies throughout sessions. Pt donned underwear, pants, socks, shoes, and AFOs sitting EOB with supervision and increased time. Pt required min A to lift LEs onto lap to don socks due to slippery floor. Pt reports at home she has carpet and is able to lift her LEs independently. Pt transferred sit<>stand with RW from elevated bed with CGA and required min A to pull pants/underwear over hips. Required set up assist to wash bottom in standing with CGA and support on RW. Pt transferred stand<>pivot bed<>WC with RW CGA. Pt donned pull over shirt sitting in WC with set up assist. Pt brushed teeth and combed hair sitting in WC independently. Pt performed WC mobility 150ft using bilateral UEs independently to ortho gym. Pt performed simulated car transfer with RW from SUV height with mod A for LE management. Pt performed WC mobility additional 58ft using bilateral UEs independently to gym. Pt ambulated 57ft with RW min A. Pt required extensive rest break after ambulation. Pt performed the following exercises seated in [facility] with supervision and verbal cues for technique: -Overhead chest press to 90 degree chest press with 4lb dowel x12   -Diagonal D2 PNF pattern with 4lb dowel x 12 bilaterally -Biceps curls with 3lb dumbbells 2x12 bilaterally Pt transported back to room in [facility] total assist. Concluded session with pt sitting in WC, needs within reach, and chair pad not on however pt reporting she is calling for nursing assistance to use restroom.   Therapy Documentation Precautions:  Precautions Precautions: None Required Braces or Orthoses: (pt has personal B AFO's) Restrictions Weight Bearing Restrictions: No  Therapy/Group: Individual Therapy Martin Majestic PT, DPT   08/15/2019, 7:34 AM

## 2019-08-15 NOTE — Plan of Care (Signed)
  Problem: Consults Goal: RH GENERAL PATIENT EDUCATION Description: See Patient Education module for education specifics. Outcome: Progressing   Problem: RH SAFETY Goal: RH STG ADHERE TO SAFETY PRECAUTIONS W/ASSISTANCE/DEVICE Description: STG Adhere to Safety Precautions With mod I Assistance/Device. Outcome: Progressing   

## 2019-08-15 NOTE — Progress Notes (Signed)
Occupational Therapy Session Note  Patient Details  Name: Lisa Williams MRN: [id number] Date of Birth: 05-10-1960  Today's Date: 08/15/2019 OT Individual Time: 6222-9798 OT Individual Time Calculation (min): 68 min    Short Term Goals: Week 1:  OT Short Term Goal 1 (Week 1): STG=LTGs due to short ELOS  Skilled Therapeutic Interventions/Progress Updates:  Patient met seated in wc in agreement with OT treatment session. Skilled OT treatment session with focus on community re-integration and therapeutic activity as detailed below. Wc mobility from room <> [facility]. Patient request to visit gift shop and [employer] to purchase a few items with ability to maneuver wc in tight spaces and around obstacles. Therapeutic activity with focus on sit to stands and standing tolerance with incorporation of Wii game. Patient completed 6 sit to stands with knowledge of need for rest breaks between. Patient becoming slightly more unsteady with fatigue. Patient able to maintain standing balance for 10-15 sec with unilateral support with LUE on RW. Patient left seated in wc with call bell within reach and all needs met.   Therapy Documentation Precautions:  Precautions Precautions: None Required Braces or Orthoses: (pt has personal B AFO's) Restrictions Weight Bearing Restrictions: No  Therapy/Group: Individual Therapy  Tamyah Cutbirth R Howerton-Davis 08/15/2019, 7:59 AM

## 2019-08-16 NOTE — Progress Notes (Signed)
[place] PHYSICAL MEDICINE & REHABILITATION PROGRESS NOTE   Subjective/Complaints: Feels very well.  Asks why her BP med was discontinued.  Denies pain Denies constipation    ROS:  Pt denies SOB, abd pain, CP, N/V/C/D, and vision changes  Objective:   No results found. No results for input(s): WBC, HGB, HCT, PLT in the last 72 hours. Recent Labs    08/15/19 0534  CREATININE 0.78    Intake/Output Summary (Last 24 hours) at 08/16/2019 1415 Last data filed at 08/16/2019 1230 Gross per 24 hour  Intake 756 ml  Output --  Net 756 ml     Physical Exam: Vital Signs Blood pressure 128/64, pulse (!) 58, temperature 97.7 F (36.5 C), resp. rate 20, height 5\' 11"  (1.803 m), weight 121.8 kg, SpO2 100 %. Constitutional: awake, sitting up in chair comfortably HEENT: conjugate gaze Neck: supple Cardiovascular: bradycardic regular rhythm Respiratory/Chest: CTA B/L    GI/Abdomen:soft, NT, ND, (+) hypoactive Ext: no clubbing, cyanosis, or edema Psych: appropriate Musculoskeletal:  General: Normal range of motion.  Cervical back: Normal range of motion.  Neurological: She isalertand oriented to person, place, and time.   Normal CN. UE motor 5/5. LE:    RLE: 0/5 throughout LLE: 1/5 HF, 3/5 KE, AFO in place.   ADF/PF with heel cord contractures. Decreased LT/PP in both legs. DTR's trace.  Skin: skin warm, dry- no skin breakdown seen.   . Assessment/Plan: 1. Functional deficits secondary to MS exacerbation which require 3+ hours per day of interdisciplinary therapy in a comprehensive inpatient rehab setting.  Physiatrist is providing close team supervision and 24 hour management of active medical problems listed below.  Physiatrist and rehab team continue to assess barriers to discharge/monitor patient progress toward functional and medical goals  Care Tool:  Bathing    Body parts bathed by patient: Right arm, Left lower leg, Left arm, Chest, Abdomen, Right  lower leg, Front perineal area, Right upper leg, Left upper leg, Face   Body parts bathed by helper: Buttocks     Bathing assist Assist Level: Minimal Assistance - Patient > 75%     Upper Body Dressing/Undressing Upper body dressing   What is the patient wearing?: Pull over shirt    Upper body assist Assist Level: Set up assist    Lower Body Dressing/Undressing Lower body dressing      What is the patient wearing?: Underwear/pull up, Pants     Lower body assist Assist for lower body dressing: Minimal Assistance - Patient > 75%     Toileting Toileting    Toileting assist Assist for toileting: Minimal Assistance - Patient > 75%     Transfers Chair/bed transfer  Transfers assist     Chair/bed transfer assist level: Minimal Assistance - Patient > 75%     Locomotion Ambulation   Ambulation assist      Assist level: Contact Guard/Touching assist Assistive device: Walker-rolling Max distance: 67ft   Walk 10 feet activity   Assist     Assist level: Contact Guard/Touching assist Assistive device: Walker-rolling   Walk 50 feet activity   Assist    Assist level: Minimal Assistance - Patient > 75% Assistive device: Walker-rolling    Walk 150 feet activity   Assist Walk 150 feet activity did not occur: Safety/medical concerns         Walk 10 feet on uneven surface  activity   Assist Walk 10 feet on uneven surfaces activity did not occur: Safety/medical concerns  Wheelchair     Assist Will patient use wheelchair at discharge?: Yes Type of Wheelchair: Manual    Wheelchair assist level: Independent Max wheelchair distance: 171ft    Wheelchair 50 feet with 2 turns activity    Assist        Assist Level: Independent   Wheelchair 150 feet activity     Assist      Assist Level: Independent   Blood pressure 128/64, pulse (!) 58, temperature 97.7 F (36.5 C), resp. rate 20, height 5\' 11"  (1.803 m), weight  121.8 kg, SpO2 100 %.  Medical Problem List and Plan: 1.Increasing bilateral lower extremity weakness with spasticitysecondary to MS exacerbation. IV Solu-Medrol completed. Patient diagnosed 51 with multiple MS exacerbations in the past followed by Dr. [facility] clinic -patient may shower -ELOS/Goals: 7-10 days, mod I to supervision goals. Has signficant baseline LE weakness.   --Continue CIR therapies including PT, OT   -reviewed return to driving considerations  Team conference 4/7. DC Tuesday 4/13.  2. Antithrombotics: -DVT/anticoagulation:Lovenox -antiplatelet therapy: N/A 3. Pain Management/history of hereditary and idiopathic neuropathy:Neurontin 100 mg twice daily, baclofen 20 mg 3 times daily  4/10- denies pain 4. Mood:Provide emotional support -antipsychotic agents: N/A 5. Neuropsych: This patientiscapable of making decisions on herown behalf. 6. Skin/Wound Care:Routine skin checks 7. Fluids/Electrolytes/Nutrition:Routine in and outs with follow-up chemistries 8. Hypertension. Avapro 150 mg daily. Monitor with increased mobility             -4/7: Has been hypotensive past couple of days. Will adjust dose of Avapro to 75mg  daily.  4/8- soft BP 103/59-will reduce to a 1/2 of pill if possible- since BP so low.    4/9- BG still soft 108/50s- pulse 46! will stop Avapro for now and monitor  4/10: Reviewed most recent Bps with patient and advised that Avapro was stopped because she was hypotensive and bradycardic. Advised her to monitor BP at home daily (she has monitor) and to log readings, and to bring readings to follow-up appointment with PCP or PM&R so medication can be added if needed.  9. Neurogenic bladder.  .              -seems to be voiding continently now             -Continue Ditropan 10 mg daily  4/10- voiding well- no complaints 10. Tobacco abuse. Counseling 11. Constipation/neurogenic  bowel  4/6- pt says she normally goes q2-3 days- which is normal for her- denies constipation, but will ned to monitor closely.   4/9- LBM this AM- con't regimen 12. Worsening bilateral lower extremity weakness: Patient states that in past MS exacerbations she received a steroid taper after receiving IV Solumedrol and tolerated this well. Will start steroid taper today. [place] neurology for recommendations as well.  4/9- no additional weakness/fatigue- doing well    LOS: 8 days A FACE TO FACE EVALUATION WAS PERFORMED  Jernie Schutt P Aubrei Bouchie 08/16/2019, 2:15 PM

## 2019-08-17 ENCOUNTER — Inpatient Hospital Stay (HOSPITAL_COMMUNITY): Payer: PPO | Admitting: Occupational Therapy

## 2019-08-17 NOTE — Progress Notes (Addendum)
Occupational Therapy Session Note  Patient Details  Name: Lisa Williams MRN: [id number] Date of Birth: 1960/10/26  Today's Date: 08/17/2019 OT Individual Time: 1194-1740 OT Individual Time Calculation (min): 77 min    Short Term Goals: Week 1:  OT Short Term Goal 1 (Week 1): STG=LTGs due to short ELOS  Skilled Therapeutic Interventions/Progress Updates:    Treatment session with focus on functional transfers, sit > stand, and dynamic standing balance during self-care tasks.  Pt received upright in bed reporting desire to shower.  Therapist provided pt with different shower chair with BUE arm support and increased height to aid in transfers (per pt request).  Pt completed bed mobility with supervision, stand pivot transfers bed > w/c > shower seat with RW with CGA.  Pt completed bathing with setup assist at seated level.  Pt reports not feeling strong enough to stand to transfer out of shower as she reports fatigue and overheated.  Required +2 for stability of equipment to allow pt to transfer multiple squats Supervision back to w/c (one person stabilizing shower seat and other stabilizing w/c).  Provided pt with ice pack and rest break prior to completing dressing tasks.  Due to dressing in w/c pt required assistance to obtain figure 4 positioning to allow pt to don underwear, pants, and socks/shoes.  Pt unable to stand despite 3 attempts, therefore therapist pulled underwear and pants over hips while pt maintained push up from w/c.  Extensive education provided about energy conservation strategies and when to engage in bathing at shower to allow for increased activity tolerance.  Upon passing later in the AM, pt reports feeling better after cooling down and ability to ambulate in/out of bathroom with RW with nurse tech for toileting needs.  Therapy Documentation Precautions:  Precautions Precautions: None Required Braces or Orthoses: (pt has personal B AFO's) Restrictions Weight  Bearing Restrictions: No Pain:   Pt with no c/o pain  Therapy/Group: Individual Therapy  Rosalio Loud 08/17/2019, 10:27 AM

## 2019-08-17 NOTE — Progress Notes (Signed)
Lisa Williams PHYSICAL MEDICINE & REHABILITATION PROGRESS NOTE   Subjective/Complaints: Feels very well.  Feels strength is improving, fluctuates on exam Denies pain Denies constipation   ROS:  Pt denies SOB, abd pain, CP, N/V/C/D, and vision changes  Objective:   No results found. No results for input(s): WBC, HGB, HCT, PLT in the last 72 hours. Recent Labs    08/15/19 0534  CREATININE 0.78    Intake/Output Summary (Last 24 hours) at 08/17/2019 1342 Last data filed at 08/17/2019 0832 Gross per 24 hour  Intake 460 ml  Output --  Net 460 ml     Physical Exam: Vital Signs Blood pressure (!) 113/57, pulse (!) 53, temperature 98.5 F (36.9 C), temperature source Oral, resp. rate 18, height 5\' 11"  (1.803 m), weight 121.5 kg, SpO2 99 %. Constitutional: awake, sitting up in chair comfortably HEENT: conjugate gaze Neck: supple Cardiovascular: bradycardic regular rhythm Respiratory/Chest: CTA B/L    GI/Abdomen:soft, NT, ND, (+) hypoactive Ext: no clubbing, cyanosis, or edema Psych: appropriate Musculoskeletal:  General: Normal range of motion.  Cervical back: Normal range of motion.  Neurological: She isalertand oriented to person, place, and time.   Normal CN. UE motor 5/5. LE:    RLE: 0/5 HF, KE, 3/5 DF, PF LLE: 1/5 HF, 3/5 KE, 3/5 DF/PF  ADF/PF with heel cord contractures. Decreased LT/PP in both legs. DTR's trace.  Skin: skin warm, dry- no skin breakdown seen.   . Assessment/Plan: 1. Functional deficits secondary to MS exacerbation which require 3+ hours per day of interdisciplinary therapy in a comprehensive inpatient rehab setting.  Physiatrist is providing close team supervision and 24 hour management of active medical problems listed below.  Physiatrist and rehab team continue to assess barriers to discharge/monitor patient progress toward functional and medical goals  Care Tool:  Bathing    Body parts bathed by patient: Right arm, Left lower  leg, Left arm, Chest, Abdomen, Right lower leg, Front perineal area, Right upper leg, Left upper leg, Face   Body parts bathed by helper: Buttocks     Bathing assist Assist Level: Set up assist     Upper Body Dressing/Undressing Upper body dressing   What is the patient wearing?: Pull over shirt    Upper body assist Assist Level: Independent    Lower Body Dressing/Undressing Lower body dressing      What is the patient wearing?: Underwear/pull up, Pants     Lower body assist Assist for lower body dressing: Moderate Assistance - Patient 50 - 74%     Toileting Toileting    Toileting assist Assist for toileting: Minimal Assistance - Patient > 75%     Transfers Chair/bed transfer  Transfers assist     Chair/bed transfer assist level: Contact Guard/Touching assist     Locomotion Ambulation   Ambulation assist      Assist level: Contact Guard/Touching assist Assistive device: Walker-rolling Max distance: 82ft   Walk 10 feet activity   Assist     Assist level: Contact Guard/Touching assist Assistive device: Walker-rolling   Walk 50 feet activity   Assist    Assist level: Minimal Assistance - Patient > 75% Assistive device: Walker-rolling    Walk 150 feet activity   Assist Walk 150 feet activity did not occur: Safety/medical concerns         Walk 10 feet on uneven surface  activity   Assist Walk 10 feet on uneven surfaces activity did not occur: Safety/medical concerns  Wheelchair     Assist Will patient use wheelchair at discharge?: Yes Type of Wheelchair: Manual    Wheelchair assist level: Independent Max wheelchair distance: 15ft    Wheelchair 50 feet with 2 turns activity    Assist        Assist Level: Independent   Wheelchair 150 feet activity     Assist      Assist Level: Independent   Blood pressure (!) 113/57, pulse (!) 53, temperature 98.5 F (36.9 C), temperature source Oral, resp.  rate 18, height 5\' 11"  (1.803 m), weight 121.5 kg, SpO2 99 %.  Medical Problem List and Plan: 1.Increasing bilateral lower extremity weakness with spasticitysecondary to MS exacerbation. IV Solu-Medrol completed. Patient diagnosed 32 with multiple MS exacerbations in the past followed by Dr. 80 clinic -patient may shower -ELOS/Goals: 7-10 days, mod I to supervision goals. Has signficant baseline LE weakness.   --Continue CIR therapies including PT, OT   -reviewed return to driving considerations  Team conference 4/7. DC Tuesday 4/13.  2. Antithrombotics: -DVT/anticoagulation:Lovenox -antiplatelet therapy: N/A 3. Pain Management/history of hereditary and idiopathic neuropathy:Neurontin 100 mg twice daily, baclofen 20 mg 3 times daily  4/10- denies pain 4. Mood:Provide emotional support -antipsychotic agents: N/A 5. Neuropsych: This patientiscapable of making decisions on herown behalf. 6. Skin/Wound Care:Routine skin checks 7. Fluids/Electrolytes/Nutrition:Routine in and outs with follow-up chemistries 8. Hypertension. Avapro 150 mg daily. Monitor with increased mobility             -4/7: Has been hypotensive past couple of days. Will adjust dose of Avapro to 75mg  daily.  4/8- soft BP 103/59-will reduce to a 1/2 of pill if possible- since BP so low.    4/9- BG still soft 108/50s- pulse 46! will stop Avapro for now and monitor  4/10: Reviewed most recent Bps with patient and advised that Avapro was stopped because she was hypotensive and bradycardic. Advised her to monitor BP at home daily (she has monitor) and to log readings, and to bring readings to follow-up appointment with PCP or PM&R so medication can be added if needed.   4/11: Bps still soft off Avapro.  9. Neurogenic bladder.  .              -seems to be voiding continently now             -Continue Ditropan 10 mg daily  4/11- voiding  well 10. Tobacco abuse. Counseling 11. Constipation/neurogenic bowel  4/11- moving bowels at her baseline/  12. Worsening bilateral lower extremity weakness: Patient states that in past MS exacerbations she received a steroid taper after receiving IV Solumedrol and tolerated this well. Will start steroid taper today. Curbsiding neurology for recommendations as well.  4/11- strength continues to improve    LOS: 9 days A FACE TO FACE EVALUATION WAS PERFORMED  6/11 P Rosalie Gelpi 08/17/2019, 1:42 PM

## 2019-08-17 NOTE — Discharge Summary (Addendum)
Physician Discharge Summary  Patient ID: Lisa Williams MRN: [id number] DOB/AGE: 58/07/1960 58 y.o.  Admit date: 08/08/2019 Discharge date: 08/19/2019  Discharge Diagnoses:  Principal Problem:   Multiple sclerosis exacerbation ([place]) Active Problems:   MS (multiple sclerosis) ([place])   Muscle spasticity   Essential hypertension   Neurogenic bowel   Neurogenic bladder   Labile blood pressure   Neuropathic pain DVT prophylaxis Neurogenic bladder Tobacco abuse Constipation neurogenic bowel  Discharged Condition: Stable  Significant Diagnostic Studies: No results found.  Labs:  Basic Metabolic Panel: Recent Labs  Lab 08/15/19 0534  CREATININE 0.78    CBC: No results for input(s): WBC, NEUTROABS, HGB, HCT, MCV, PLT in the last 168 hours.  CBG: No results for input(s): GLUCAP in the last 168 hours.  Family history.  Sister with sickle cell trait, hypertension, supraventricular tachycardia hypertension.  Paternal grandmother with diabetes as well as liver cancer.  Maternal aunt with diabetes and breast cancer.  Negative for colon cancer rectal cancer or esophageal cancer  Brief HPI:   Lisa Williams is a 59 y.o. right-handed female with history of multiple sclerosis diagnosed in 1995 with multiple exacerbations followed by Dr. [facility] clinic, hypertension, hereditary and idiopathic neuropathy, HPV chronic back pain tobacco abuse hyperlipidemia and neurogenic bladder.  Per chart review independent with assistive device prior to admission ambulating short distances using a manual wheelchair outside the home had been receiving outpatient therapies.  She did drive with hand controls.  Presented 08/03/2019 with increasing bilateral lower extremity weakness and decrease in sensation.  Patient just received her Covid vaccine the day prior.  Admission chemistries unremarkable SARS coronavirus negative urinalysis negative nitrite.  Neurology follow-up suspect  MS exacerbation placed on 5 days Solu-Medrol.  Subcutaneous Lovenox for DVT prophylaxis.  Therapy evaluations completed patient was admitted for a comprehensive rehab program   Hospital Course: Lisa Williams was admitted to rehab 08/08/2019 for inpatient therapies to consist of PT, ST and OT at least three hours five days a week. Past admission physiatrist, therapy team and rehab RN have worked together to provide customized collaborative inpatient rehab.  Pertaining to patient's MS exacerbation she had completed IV course of Solu-Medrol she would follow Dr. Gurney [facility]in [facility].  She remained on Lovenox for DVT prophylaxis no bleeding episodes.  Pain managed as well as history of hereditary and idiopathic neuropathy Neurontin baclofen as directed.  Blood pressure was somewhat soft Avapro was discontinued she would follow-up with her primary MD for ongoing monitoring and care..  Neurogenic bowel and bladder seems to be voiding constantly maintained on low-dose Ditropan or bowel program was modified.   Blood pressures were monitored on TID basis and remained somewhat soft and monitored  She is continent of bowel and bladder.  She has made gains during rehab stay and is attending therapies  She will continue to receive follow up therapies   after discharge  Rehab course: During patient's stay in rehab weekly team conferences were held to monitor patient's progress, set goals and discuss barriers to discharge. At admission, patient required minimal assist ambulate 50 feet rolling walker, minimal guard for rolling, minimal assist sit to stand.  Moderate to max assist for lower body ADLs management occluding dressing and bathing  Physical exam.  Blood pressure 142/70 pulse 58 temperature 97.6 respirations 18 oxygen saturation 96% room air Constitutional.  Alert oriented well-developed HEENT Head.  Normocephalic and atraumatic Neck.  Supple nontender no JVD without  thyromegaly Cardiac regular rate  rhythm without any extra sounds or murmur heard Abdomen.  Soft nontender positive bowel sounds without rebound Eyes.  Pupils round and reactive to light no discharge.nystagmus Respiratory effort normal no respiratory distress without wheeze Musculoskeletal.  Normal range of motion Neurological.  Normal cranial nerve upper extremities 5 out of 5 lower extremities 1+ to 2 - hip flexors knee extension and similar of both ankles.  She had AFOs in place which fit appropriately.  Decreased light touch proprioception in both legs DTRs trace.  She  has had improvement in activity tolerance, balance, postural control as well as ability to compensate for deficits. She has had improvement in functional use RUE/LUE  and RLE/LLE as well as improvement in awareness.  Working with energy conservation techniques.  Sessions focused on addressing functional mobility transfers lower extremity strength dynamic standing balance coordination ambulation simulated car transfers and improved activity tolerance.  She did require multiple rest breaks.  Patient demonstrates improvement with energy conservation strategies throughout sessions.  She is able to put on her undergarments pants socks shoes AFOs sitting edge of bed supervision increased time.  Required minimal assist to lift lower extremities onto lap to don her socks.  Transferred stand pivot bed wheelchair rolling walker contact-guard assist.  Donned pullover shirt sitting wheelchair set up assist.  Propels her wheelchair supervision.  Ambulate 75 feet with assistive device.  Full family teaching completed plan discharge to home       Disposition: Discharge to home    Diet: Regular  Special Instructions: No driving smoking or alcohol  Medications at discharge 1.  Tylenol as needed 2.  Baclofen 20 mg p.o. 3 times daily 3.  Vitamin D 2000 units p.o. daily 4.  Pepcid 20 mg p.o. twice daily 5.  Neurontin 100 mg p.o. twice  daily 6.  Multivitamin daily 7.  Ditropan 10 mg p.o. daily  Discharge Instructions     Ambulatory referral to Physical Medicine Rehab   Complete by: As directed    Moderate complexity follow-up 1 to 2 weeks multiple sclerosis exacerbation       Follow-up Information     Marcello Fennel, MD Follow up.   Specialty: Physical Medicine and Rehabilitation Why: Office to call for appointment Contact information: [address] [place] [address] [place] [address] [phone]         Morene Crocker, MD Follow up.   Specialty: Neurology Why: Call for appointment Contact information: [address] [facility] [place] [address] [phone]            Signed: Charlton Amor 08/19/2019, 5:07 AM Patient was seen, face-face, and physical exam performed by me on day of discharge, less than 30 minutes of total time spent.. Please see progress note from day of discharge as well.  Maryla Morrow, MD, ABPMR

## 2019-08-18 ENCOUNTER — Inpatient Hospital Stay (HOSPITAL_COMMUNITY): Payer: PPO

## 2019-08-18 ENCOUNTER — Inpatient Hospital Stay (HOSPITAL_COMMUNITY): Payer: PPO | Admitting: Occupational Therapy

## 2019-08-18 DIAGNOSIS — N319 Neuromuscular dysfunction of bladder, unspecified: Secondary | ICD-10-CM

## 2019-08-18 DIAGNOSIS — M792 Neuralgia and neuritis, unspecified: Secondary | ICD-10-CM

## 2019-08-18 DIAGNOSIS — M62838 Other muscle spasm: Secondary | ICD-10-CM

## 2019-08-18 DIAGNOSIS — K592 Neurogenic bowel, not elsewhere classified: Secondary | ICD-10-CM

## 2019-08-18 DIAGNOSIS — R0989 Other specified symptoms and signs involving the circulatory and respiratory systems: Secondary | ICD-10-CM

## 2019-08-18 MED ORDER — OXYBUTYNIN CHLORIDE ER 10 MG PO TB24: 10.0000 mg | ORAL_TABLET | Freq: Every day | ORAL | 11 refills | Status: DC

## 2019-08-18 MED ORDER — ADULT MULTIVITAMIN W/MINERALS CH: 1.0000 | ORAL_TABLET | Freq: Every day | ORAL | Status: DC

## 2019-08-18 MED ORDER — BACLOFEN 20 MG PO TABS: 20.0000 mg | ORAL_TABLET | Freq: Three times a day (TID) | ORAL | 0 refills | Status: DC

## 2019-08-18 MED ORDER — FAMOTIDINE 20 MG PO TABS: 20.0000 mg | ORAL_TABLET | Freq: Two times a day (BID) | ORAL | 0 refills | Status: DC

## 2019-08-18 MED ORDER — GABAPENTIN 100 MG PO CAPS: 100.0000 mg | ORAL_CAPSULE | Freq: Two times a day (BID) | ORAL | 0 refills | Status: DC

## 2019-08-18 NOTE — Progress Notes (Signed)
Lisa Williams PHYSICAL MEDICINE & REHABILITATION PROGRESS NOTE   Subjective/Complaints: Patient seen sitting up in bed this morning.  She states she slept well overnight.  She states she is looking forward to discharge tomorrow.  Discussed resting orthosis with patient.   ROS: Denies CP, SOB, N/V/D  Objective:   No results found. No results for input(s): WBC, HGB, HCT, PLT in the last 72 hours. No results for input(s): NA, K, CL, CO2, GLUCOSE, BUN, CREATININE, CALCIUM in the last 72 hours.  Intake/Output Summary (Last 24 hours) at 08/18/2019 0915 Last data filed at 08/18/2019 0848 Gross per 24 hour  Intake 756 ml  Output --  Net 756 ml     Physical Exam: Vital Signs Blood pressure (!) 96/48, pulse (!) 58, temperature 97.8 F (36.6 C), temperature source Oral, resp. rate 19, height 5\' 11"  (1.803 m), weight 122.4 kg, SpO2 100 %. Constitutional: No distress . Vital signs reviewed. HENT: Normocephalic.  Atraumatic. Eyes: EOMI. No discharge. Cardiovascular: No JVD. Respiratory: Normal effort.  No stridor. GI: Non-distended. Skin: Warm and dry.  Intact. Psych: Normal mood.  Normal behavior. Musc: No edema in extremities.  No tenderness in extremities. Neurological: Alert Motor: Bilateral upper extremities: 4+/5 proximal distal Left lower extremity: Hip flexion, knee extension 2/5, ankle dorsiflexion 2/5 Right lower extremity: Hip flexion, knee extension 1+-2 -/5, ankle dorsiflexion 0/5  Assessment/Plan: 1. Functional deficits secondary to MS exacerbation which require 3+ hours per day of interdisciplinary therapy in a comprehensive inpatient rehab setting.  Physiatrist is providing close team supervision and 24 hour management of active medical problems listed below.  Physiatrist and rehab team continue to assess barriers to discharge/monitor patient progress toward functional and medical goals  Care Tool:  Bathing    Body parts bathed by patient: Right arm, Left lower leg,  Left arm, Chest, Abdomen, Right lower leg, Front perineal area, Right upper leg, Left upper leg, Face   Body parts bathed by helper: Buttocks     Bathing assist Assist Level: Set up assist     Upper Body Dressing/Undressing Upper body dressing   What is the patient wearing?: Pull over shirt    Upper body assist Assist Level: Independent    Lower Body Dressing/Undressing Lower body dressing      What is the patient wearing?: Underwear/pull up, Pants     Lower body assist Assist for lower body dressing: Minimal Assistance - Patient > 75%     Toileting Toileting    Toileting assist Assist for toileting: Minimal Assistance - Patient > 75%     Transfers Chair/bed transfer  Transfers assist     Chair/bed transfer assist level: Supervision/Verbal cueing(RW)     Locomotion Ambulation   Ambulation assist      Assist level: Contact Guard/Touching assist Assistive device: Walker-rolling Max distance: 28ft   Walk 10 feet activity   Assist     Assist level: Contact Guard/Touching assist Assistive device: Walker-rolling   Walk 50 feet activity   Assist    Assist level: Contact Guard/Touching assist Assistive device: Walker-rolling    Walk 150 feet activity   Assist Walk 150 feet activity did not occur: Safety/medical concerns(fatigue, generalized weakness, decreased postural control)         Walk 10 feet on uneven surface  activity   Assist Walk 10 feet on uneven surfaces activity did not occur: Safety/medical concerns(fatigue, generalized weakness, decreased postural control)         Wheelchair     Assist Will patient use  wheelchair at discharge?: Yes Type of Wheelchair: Manual    Wheelchair assist level: Independent Max wheelchair distance: 125ft    Wheelchair 50 feet with 2 turns activity    Assist        Assist Level: Independent   Wheelchair 150 feet activity     Assist      Assist Level: Independent    Blood pressure (!) 96/48, pulse (!) 58, temperature 97.8 F (36.6 C), temperature source Oral, resp. rate 19, height 5\' 11"  (1.803 m), weight 122.4 kg, SpO2 100 %.  Medical Problem List and Plan: 1.Increasing bilateral lower extremity weakness with spasticitysecondary to MS exacerbation. IV Solu-Medrol completed. Patient diagnosed 10 with multiple MS exacerbations in the past followed by Dr. Larinda [facility] clinic  Continue CIR  B/l PRAFOs ordered 2. Antithrombotics: -DVT/anticoagulation:Lovenox  Creatinine within normal limits on 4/9 -antiplatelet therapy: N/A 3. Pain Management/history of hereditary and idiopathic neuropathy:Neurontin 100 mg twice daily, baclofen 20 mg 3 times daily  Controlled with meds on 4/12 4. Mood:Provide emotional support -antipsychotic agents: N/A 5. Neuropsych: This patientiscapable of making decisions on herown behalf. 6. Skin/Wound Care:Routine skin checks 7. Fluids/Electrolytes/Nutrition:Routine in and outs 8. Hypertension. Avapro 150 mg daily DC'd. Monitor with increased mobility  Monitor BP at home daily (she has monitor) and to log readings, and to bring readings to follow-up appointment with PCP   Labile on 4/12 9. Neurogenic bladder.  .              -seems to be voiding continently now             -Continue Ditropan 10 mg daily  Improving 10. Tobacco abuse. Counseling 11. Constipation/neurogenic bowel  At baseline 12. Bilateral lower extremity weakness: Patient states that in past MS exacerbations she received a steroid taper after receiving IV Solumedrol and tolerated this well.   Steroid taper completed  LOS: 10 days A FACE TO FACE EVALUATION WAS PERFORMED  Lisa Williams 08/18/2019, 9:15 AM

## 2019-08-18 NOTE — Progress Notes (Signed)
Occupational Therapy Session Note  Patient Details  Name: Lisa Williams MRN: [id number] Date of Birth: March 20, 1961  Today's Date: 08/18/2019 OT Individual Time: 1030-1130 OT Individual Time Calculation (min): 60 min    Short Term Goals: Week 1:  OT Short Term Goal 1 (Week 1): STG=LTGs due to short ELOS  Skilled Therapeutic Interventions/Progress Updates:    Treatment session with focus on functional transfers, w/c mobility in home environment, and activity tolerance.  Pt received upright in w/c having already dressed during PT session.  Pt continues to require assist to cross BLE in to figure 4 position for LB dressing (reports able to do this at home from EOB in her home), inconsistently requires assist to pull pants over hips in standing position.  Completed toilet transfer in ADL apt with RW with close supervision completing short distance ambulation to simulate home setup.  Pt able to complete clothing management with close supervision during toileting task.  Engaged in home making tasks in ADL apt with simulated meal prep, pt demonstrating good awareness of w/c positioning to obtain items from refrigerator, cabinets, and oven without assist.  Discussed various strategies to continue to conserve energy during home making tasks. Engaged in Wii bowling activity in sitting with focus on core strengthening and activity tolerance.  Pt reports plan to defer increased standing tasks as she was "conserving energy" as she has PT session this afternoon.  Pt reports ready for d/c home tomorrow.  Therapy Documentation Precautions:  Precautions Precautions: Other (comment), Fall Precaution Comments: MS Required Braces or Orthoses: (pt has personal B AFO's) Restrictions Weight Bearing Restrictions: No Vital Signs: Therapy Vitals Temp: 98.6 F (37 C) Temp Source: Oral Pulse Rate: 73 Resp: 17 BP: 137/66 Patient Position (if appropriate): Sitting Oxygen Therapy SpO2: 99 % O2 Device:  Room Air Pain: Pain Assessment Pain Scale: 0-10 Pain Score: 0-No pain   Therapy/Group: Individual Therapy  Rosalio Loud 08/18/2019, 3:27 PM

## 2019-08-18 NOTE — Progress Notes (Signed)
Orthopedic Tech Progress Note Patient Details:  Alixis Harmon Langelier [birth year] [id number] Called in order to [facility] for BLE PRAFO Patient ID: Sundi Slevin Colonna, female   DOB: Jul 27, 1960, 59 y.o.   MRN: [id number]   Donald Pore 08/18/2019, 9:45 AM

## 2019-08-18 NOTE — Progress Notes (Signed)
Physical Therapy Session Note  Patient Details  Name: Lisa Williams MRN: [id number] Date of Birth: 03-28-1961  Today's Date: 08/18/2019 PT Individual Time: 4008-6761 and 1300-1410 PT Individual Time Calculation (min): 48 min  and 70 min and Today's Date: 08/18/2019 PT Missed Time: 12 Minutes Missed Time Reason: Patient fatigue  Short Term Goals: Week 1:  PT Short Term Goal 1 (Week 1): STG = LTG due to short ELOS.   Skilled Therapeutic Interventions/Progress Updates:   Treatment Session 1: [phone] 48 min Received pt supine in bed, pt agreeable to therapy, and denied any pain during session. Session focused on discharge planning, dressing, functional mobility/transfers, LE strength, dynamic standing balance/coordination, simulated car tranfers, and improved activity tolerance. Pt transferred supine<>sitting EOB with supervision with HOB elevated with use of bed features and sit<>supine with min A for LE management. Pt reports she has a hospital bed at home and requires assistance from her sister to get her legs into bed at home. While sitting EOB pt donned underwear, pants, socks, and bilateral AFOs independently. However, pt required min A to help lift LE into figure four position to don socks/shoes due to slippery floor surface. Pt transferred sit<>stand with RW and close supervision and required set up assist to hand pt washcloth to clean bottom and required min A to pull pants over hips in standing. Pt transferred stand<>pivot with RW and close supervision to [facility]. Pt performed WC parts management independently and performed WC mobility 155ft using bilateral UEs independently to therapy gym. Pt performed simulated car transfer with RW CGA and increased time due to fatigue. After car transfer pt reporting feeling like she was overheating and requested to return to room and continue therapy this afternoon. Pt transported back to room in [facility] total assist. Concluded session with pt sitting in  WC, needs within reach, and chair pad alarm on. Therapist provided pt with ice pack to assist with overheating. 12 minutes missed of skilled physical therapy due to fatigue.   Treatment Session 2: 1300-1410 70 min Received pt sitting in WC, pt agreeable to therapy, and denied any pain during session. Session focused on discharge planning, functional mobility/transfers, UE/LE strength, dynamic standing balance/coordination, ambulation, and improved activity tolerance. Pt reported urge to use restroom and transferred stand<>pivot WC<>commode with RW and supervision. Pt required min A to doff underwear and pants in standing. Pt unable to void. Pt transferred sit<>stand with RW with supervision from commode and ambulated 26ft with RW supervision to WC. Pt performed WC mobility 158ft using bilateral UEs independently to therapy gym. Pt ambulated 37ft with RW CGA/close supervision. Pt required extensive rest breaks throughout session due to increased fatigue. Pt transferred stand<>pivot WC<>mat with RW CGA. Worked on dynamic seated balance playing zoom ball overhead and at diagonal angles 3x20 reps with supervision. Pt reported 4/10 fatigue after activity. Pt performed the following exercises seated on mat with supervision and verbal cues for technique: -triceps extensions on yoga block 2x10  -overhead chest press<>horizontal chess press<> diagonal rotation x8 reps with 5lb dowel -bicep curls with 5lb dowel 2x10 Pt transferred stand<>pivot mat<>WC stand<>pivot with RW CGA. Pt transported back to room in [facility] total assist. Concluded session with pt sitting in WC, needs within reach, and chair pad alarm on.   Therapy Documentation Precautions:  Precautions Precautions: None Required Braces or Orthoses: (pt has personal B AFO's) Restrictions Weight Bearing Restrictions: No  Therapy/Group: Individual Therapy Martin Majestic PT, DPT   08/18/2019, 7:35 AM

## 2019-08-18 NOTE — Progress Notes (Signed)
Occupational Therapy Discharge Summary  Patient Details  Name: Lisa Williams MRN: [id number] Date of Birth: 22-Jan-1961   Patient has met 5 of 7 long term goals due to improved activity tolerance, improved balance, ability to compensate for deficits and improved awareness.  Patient to discharge at overall Supervision level.  Patient's care partner is independent to provide the necessary intermittent physical assistance at discharge.  Patient continues to require assist to cross BLE in to figure 4 position to then allow pt to independently complete LB dressing and donning socks/shoes/AFOs and inconsistently requires assistance to pull LB clothing over hips at sit > stand level as pt typically fatiguing by this point in self-care tasks.  Pt will require physical assistance with shower transfers, plans to purchase bariatric tub bench independently and focus on with HHOT.  Pt reports she has been spongebathing for ~1 year and will work on tub transfers when she feels stronger.  Reasons goals not met: Patient continues to require assist to cross BLE in to figure 4 position to then allow pt to independently complete LB dressing and donning socks/shoes/AFOs and inconsistently requires assistance to pull LB clothing over hips at sit > stand level as pt typically fatiguing by this point in self-care tasks.  Pt will require physical assistance with shower transfers, plans to purchase bariatric tub bench independently and focus on with HHOT.  Pt reports she has been spongebathing for ~1 year and will work on tub transfers when she feels stronger.  Recommendation:  Patient will benefit from ongoing skilled OT services in home health setting to continue to advance functional skills in the area of BADL and Reduce care partner burden.  Equipment: No equipment provided  Reasons for discharge: treatment goals met and discharge from hospital  Patient/family agrees with progress made and goals achieved:  Yes  OT Discharge Precautions/Restrictions  Precautions Precautions: Other (comment);Fall Precaution Comments: MS Vital Signs Therapy Vitals Temp: 98.6 F (37 C) Temp Source: Oral Pulse Rate: 73 Resp: 17 BP: 137/66 Patient Position (if appropriate): Sitting Oxygen Therapy SpO2: 99 % O2 Device: Room Air Pain Pain Assessment Pain Scale: 0-10 Pain Score: 0-No pain ADL ADL Eating: Independent Grooming: Independent Where Assessed-Grooming: Sitting at sink Upper Body Bathing: Setup Where Assessed-Upper Body Bathing: Shower, Edge of bed Lower Body Bathing: Setup Where Assessed-Lower Body Bathing: Shower, Edge of bed Upper Body Dressing: Modified independent (Device) Lower Body Dressing: Minimal assistance Where Assessed-Lower Body Dressing: Edge of bed, Wheelchair Toileting: Supervision/safety Toilet Transfer: Close supervision Toilet Transfer Method: Stand pivot, Counselling psychologist: Bedside commode(over toilet) Vision Baseline Vision/History: No visual deficits Wears Glasses: At all times Patient Visual Report: No change from baseline Vision Assessment?: No apparent visual deficits Perception  Perception: Within Functional Limits Praxis Praxis: Intact Cognition Overall Cognitive Status: Within Functional Limits for tasks assessed Arousal/Alertness: Awake/alert Orientation Level: Oriented X4 Attention: Alternating Alternating Attention: Appears intact Memory: Appears intact Awareness: Appears intact Problem Solving: Appears intact Safety/Judgment: Appears intact Sensation Sensation Light Touch: Appears Intact Proprioception: Impaired by gross assessment Additional Comments: Sensation WFL Coordination Gross Motor Movements are Fluid and Coordinated: No Fine Motor Movements are Fluid and Coordinated: Yes Coordination and Movement Description: grossly uncoordinated due to bilateral LE weakness, decreased postural control, fatigue, and hx of  MS Finger Nose Finger Test: [facility] bilaterally Heel Shin Test: unable to perform bilaterally Mobility  Bed Mobility Bed Mobility: Rolling Right;Supine to Sit;Sit to Supine;Rolling Left Rolling Right: Independent Rolling Left: Independent Supine to Sit: Supervision/Verbal cueing Sit to Supine:  Minimal Assistance - Patient > 75% Transfers Sit to Stand: Supervision/Verbal cueing(RW) Stand to Sit: Supervision/Verbal cueing(RW)  Extremity/Trunk Assessment RUE Assessment RUE Assessment: Within Functional Limits General Strength Comments: 4-/5 LUE Assessment LUE Assessment: Within Functional Limits General Strength Comments: Sande Brothers 08/18/2019, 3:33 PM

## 2019-08-18 NOTE — Discharge Instructions (Signed)
Inpatient Rehab Discharge Instructions  Lisa Williams Discharge date and time: No discharge date for patient encounter.   Activities/Precautions/ Functional Status: Activity: activity as tolerated Diet: regular diet Wound Care: none needed Functional status:  ___ No restrictions     ___ Walk up steps independently ___ 24/7 supervision/assistance   ___ Walk up steps with assistance ___ Intermittent supervision/assistance  ___ Bathe/dress independently ___ Walk with walker     _x__ Bathe/dress with assistance ___ Walk Independently    ___ Shower independently ___ Walk with assistance    ___ Shower with assistance ___ No alcohol     ___ Return to work/school ________  COMMUNITY REFERRALS UPON DISCHARGE:    Home Health:   PT     OT                     Agency: [facility] Phone: [phone]    Special Instructions: No driving smoking or alcohol   My questions have been answered and I understand these instructions. I will adhere to these goals and the provided educational materials after my discharge from the hospital.  Patient/Caregiver Signature _______________________________ Date __________  Clinician Signature _______________________________________ Date __________  Please bring this form and your medication list with you to all your follow-up doctor's appointments.

## 2019-08-18 NOTE — Progress Notes (Signed)
Physical Therapy Discharge Summary  Patient Details  Name: Lisa Williams MRN: [id number] Date of Birth: 1960-05-10  Patient has met 9 of 9 long term goals due to improved activity tolerance, improved balance, improved postural control, increased strength and improved coordination. Patient to discharge at a wheelchair level Supervision. Pt's family did not attend formal family education training, however pt verbalized and demonstrated confidence with all tasks for discharge home. Pt's mother and sister have been assisting pt prior to hospital admission due to [place] and pt verbalized confidence with ability for family to assist upon discharge.   All goals met  Recommendation:  Patient will benefit from ongoing skilled PT services in home health setting and eventually outpatient PT to continue to advance safe functional mobility, address ongoing impairments in transfers, LE strength, dynamic standing balance/coordination, ambulation, stair navigation, endurance, and to minimize fall risk.  Equipment: No equipment provided  Reasons for discharge: treatment goals met  Patient/family agrees with progress made and goals achieved: Yes  PT Discharge Precautions/Restrictions Precautions Precautions: Other (comment);Fall Precaution Comments: MS Restrictions Weight Bearing Restrictions: No Cognition Overall Cognitive Status: Within Functional Limits for tasks assessed Arousal/Alertness: Awake/alert Orientation Level: Oriented X4 Memory: Appears intact Awareness: Appears intact Problem Solving: Appears intact Safety/Judgment: Appears intact Sensation Sensation Light Touch: Appears Intact Proprioception: Impaired by gross assessment Additional Comments: Sensation WFL Coordination Gross Motor Movements are Fluid and Coordinated: No Fine Motor Movements are Fluid and Coordinated: Yes Coordination and Movement Description: grossly uncoordinated due to bilateral LE weakness, decreased  postural control, fatigue, and hx of MS Finger Nose Finger Test: [facility] bilaterally Heel Shin Test: unable to perform bilaterally Motor  Motor Motor: Abnormal postural alignment and control Motor - Skilled Clinical Observations: grossly uncoordinated due to bilateral LE weakness, decreased postural control, fatigue, and compensatory movement strategies due to MS  Mobility Bed Mobility Bed Mobility: Rolling Right;Supine to Sit;Sit to Supine;Rolling Left Rolling Right: Independent Rolling Left: Independent Supine to Sit: Supervision/Verbal cueing Sit to Supine: Minimal Assistance - Patient > 75% Transfers Transfers: Sit to Stand;Stand Pivot Transfers Sit to Stand: Supervision/Verbal cueing(RW) Stand to Sit: Supervision/Verbal cueing(RW) Stand Pivot Transfers: Supervision/Verbal cueing(RW) Stand Pivot Transfer Details: Verbal cues for precautions/safety;Verbal cues for technique;Verbal cues for safe use of DME/AE Stand Pivot Transfer Details (indicate cue type and reason): verbal cues to regain balance prior to transferring Transfer (Assistive device): Rolling walker Locomotion  Gait Ambulation: Yes Gait Assistance: Contact Guard/Touching assist Gait Distance (Feet): 54 Feet Assistive device: Rolling walker;Other (Comment)(personal custom bilateral AFOs) Gait Assistance Details: Verbal cues for safe use of DME/AE;Verbal cues for technique Gait Assistance Details: verbal cues for RW safety and energy conservation Gait Gait: Yes Gait Pattern: Impaired Gait Pattern: Decreased step length - right;Decreased step length - left;Step-to pattern;Decreased stance time - left;Decreased stride length;Decreased hip/knee flexion - right;Decreased hip/knee flexion - left;Decreased dorsiflexion - right;Decreased dorsiflexion - left;Decreased weight shift to left;Poor foot clearance - right;Poor foot clearance - left;Wide base of support Gait velocity: decreased Stairs / Additional Locomotion Stairs:  No Architect: Yes Wheelchair Assistance: Independent with Camera operator: Both upper extremities Wheelchair Parts Management: Independent Distance: 13f  Trunk/Postural Assessment  Cervical Assessment Cervical Assessment: Within Functional Limits Thoracic Assessment Thoracic Assessment: Within Functional Limits Lumbar Assessment Lumbar Assessment: Exceptions to WFL(posterior pelvic tilt) Postural Control Postural Control: Deficits on evaluation  Balance Balance Balance Assessed: Yes Static Sitting Balance Static Sitting - Balance Support: Feet supported;No upper extremity supported Static Sitting - Level of Assistance: 7: Independent  Dynamic Sitting Balance Dynamic Sitting - Balance Support: No upper extremity supported;Feet supported Dynamic Sitting - Level of Assistance: 7: Independent Static Standing Balance Static Standing - Balance Support: Bilateral upper extremity supported(RW) Static Standing - Level of Assistance: 5: Stand by assistance(supervision) Dynamic Standing Balance Dynamic Standing - Balance Support: Bilateral upper extremity supported(RW) Dynamic Standing - Level of Assistance: 5: Stand by assistance(close supervision) Extremity Assessment  RLE Assessment RLE Assessment: Exceptions to [facility] General Strength Comments: Pt unable to acheive active movement while sitting in WC, therefore unable to formally test. However, gross functional strength 3+/5 due to pt's ability to transfer sit<>stand without buckling LLE Assessment LLE Assessment: Exceptions to [facility] General Strength Comments: Pt unable to acheive active movement while sitting in WC, therefore unable to formally test. However, gross functional strength 3+/5 due to pt's ability to transfer sit<>stand without buckling  Alfonse Alpers PT, DPT  08/18/2019, 7:37 AM

## 2019-08-19 ENCOUNTER — Ambulatory Visit: Payer: PPO

## 2019-08-19 DIAGNOSIS — E46 Unspecified protein-calorie malnutrition: Secondary | ICD-10-CM

## 2019-08-19 DIAGNOSIS — E8809 Other disorders of plasma-protein metabolism, not elsewhere classified: Secondary | ICD-10-CM

## 2019-08-19 MED ORDER — PRO-STAT SUGAR FREE PO LIQD: 30.0000 mL | Freq: Two times a day (BID) | ORAL | Status: DC

## 2019-08-19 NOTE — Progress Notes (Signed)
Patient discharged to home, accompanied by her daughter. 

## 2019-08-19 NOTE — Progress Notes (Signed)
Social Work Discharge Note   The overall goal for the admission was met for:   Discharge location: Yes, Home  Length of Stay: Yes, 11 Days  Discharge activity level: Yes, Independent   Home/community participation: Yes  Services provided included: MD, RD, PT, OT, SLP, RN, CM, TR, Pharmacy, Neuropsych and SW  Financial Services: Other: [employer]   Follow-up services arranged: Home Health: [facility]  Comments (or additional information): Patient has all DME in home.   Patient/Family verbalized understanding of follow-up arrangements: Yes  Individual responsible for coordination of the follow-up plan: Self, [phone]  Confirmed correct DME delivered: Dyanne Iha 08/19/2019    Dyanne Iha

## 2019-08-19 NOTE — Progress Notes (Signed)
[place] PHYSICAL MEDICINE & REHABILITATION PROGRESS NOTE   Subjective/Complaints: Patient seen sitting up in her chair this morning.  She is excited for discharge and states she woke up early to get ready.  She has questions regarding follow-up appointments.   ROS: Denies CP, SOB, N/V/D  Objective:   No results found. No results for input(s): WBC, HGB, HCT, PLT in the last 72 hours. No results for input(s): NA, K, CL, CO2, GLUCOSE, BUN, CREATININE, CALCIUM in the last 72 hours.  Intake/Output Summary (Last 24 hours) at 08/19/2019 0841 Last data filed at 08/19/2019 0823 Gross per 24 hour  Intake 1184 ml  Output --  Net 1184 ml     Physical Exam: Vital Signs Blood pressure 105/65, pulse (!) 57, temperature 98 F (36.7 C), resp. rate 17, height 5\' 11"  (1.803 m), weight 122.4 kg, SpO2 95 %. Constitutional: No distress . Vital signs reviewed. HENT: Normocephalic.  Atraumatic. Eyes: EOMI. No discharge. Cardiovascular: No JVD. Respiratory: Normal effort.  No stridor. GI: Non-distended. Skin: Warm and dry.  Intact. Psych: Normal mood.  Normal behavior. Musc: Lower extremity edema Neurological: Alert Motor: Bilateral upper extremities: 4+/5 proximal distal Left lower extremity: Hip flexion, knee extension 2-/5, ankle dorsiflexion 2/5 Right lower extremity: Hip flexion, knee extension 1+/5, ankle dorsiflexion 0/5  Assessment/Plan: 1. Functional deficits secondary to MS exacerbation which require 3+ hours per day of interdisciplinary therapy in a comprehensive inpatient rehab setting.  Physiatrist is providing close team supervision and 24 hour management of active medical problems listed below.  Physiatrist and rehab team continue to assess barriers to discharge/monitor patient progress toward functional and medical goals  Care Tool:  Bathing    Body parts bathed by patient: Right arm, Left lower leg, Left arm, Chest, Abdomen, Right lower leg, Front perineal area, Right  upper leg, Left upper leg, Face   Body parts bathed by helper: Buttocks     Bathing assist Assist Level: Set up assist     Upper Body Dressing/Undressing Upper body dressing   What is the patient wearing?: Pull over shirt    Upper body assist Assist Level: Independent    Lower Body Dressing/Undressing Lower body dressing      What is the patient wearing?: Underwear/pull up, Pants     Lower body assist Assist for lower body dressing: Minimal Assistance - Patient > 75%     Toileting Toileting    Toileting assist Assist for toileting: Supervision/Verbal cueing     Transfers Chair/bed transfer  Transfers assist     Chair/bed transfer assist level: Supervision/Verbal cueing(RW)     Locomotion Ambulation   Ambulation assist      Assist level: Contact Guard/Touching assist Assistive device: Walker-rolling Max distance: 49ft   Walk 10 feet activity   Assist     Assist level: Contact Guard/Touching assist Assistive device: Walker-rolling   Walk 50 feet activity   Assist    Assist level: Contact Guard/Touching assist Assistive device: Walker-rolling    Walk 150 feet activity   Assist Walk 150 feet activity did not occur: Safety/medical concerns(fatigue, generalized weakness, decreased postural control)         Walk 10 feet on uneven surface  activity   Assist Walk 10 feet on uneven surfaces activity did not occur: Safety/medical concerns(fatigue, generalized weakness, decreased postural control)         Wheelchair     Assist Will patient use wheelchair at discharge?: Yes Type of Wheelchair: Manual    Wheelchair assist level: Independent  Max wheelchair distance: 17ft    Wheelchair 50 feet with 2 turns activity    Assist        Assist Level: Independent   Wheelchair 150 feet activity     Assist      Assist Level: Independent   Blood pressure 105/65, pulse (!) 57, temperature 98 F (36.7 C), resp. rate 17,  height 5\' 11"  (1.803 m), weight 122.4 kg, SpO2 95 %.  Medical Problem List and Plan: 1.Increasing bilateral lower extremity weakness with spasticitysecondary to MS exacerbation. IV Solu-Medrol completed. Patient diagnosed 63 with multiple MS exacerbations in the past followed by Dr. [facility] clinic  DC today  Will see patient for hospital follow-up in 1 month post-discharge  B/l PRAFOs nightly, patient tolerating 2. Antithrombotics: -DVT/anticoagulation:Lovenox  Creatinine within normal limits on 4/9 -antiplatelet therapy: N/A 3. Pain Management/history of hereditary and idiopathic neuropathy:Neurontin 100 mg twice daily, baclofen 20 mg 3 times daily  Controlled with meds on 4/13 4. Mood:Provide emotional support -antipsychotic agents: N/A 5. Neuropsych: This patientiscapable of making decisions on herown behalf. 6. Skin/Wound Care:Routine skin checks 7. Fluids/Electrolytes/Nutrition:Routine in and outs 8. Hypertension. Avapro 150 mg daily DC'd. Monitor with increased mobility  Monitor BP at home daily (she has monitor) and to log readings, and to bring readings to follow-up appointment with PCP   Controlled on 4/13 9. Neurogenic bladder.  .              -seems to be voiding continently now             -Continue Ditropan 10 mg daily  Improving 10. Tobacco abuse. Counseling 11. Constipation/neurogenic bowel  At baseline 12. Bilateral lower extremity weakness: Patient states that in past MS exacerbations she received a steroid taper after receiving IV Solumedrol and tolerated this well.   Steroid taper completed 13.  Moderate hypoalbuminemia  Supplement initiated on 4/13  LOS: 11 days A FACE TO FACE EVALUATION WAS PERFORMED  Coreon Simkins 5/13 08/19/2019, 8:41 AM

## 2019-08-21 ENCOUNTER — Ambulatory Visit: Payer: PPO

## 2019-08-22 DIAGNOSIS — N393 Stress incontinence (female) (male): Secondary | ICD-10-CM | POA: Diagnosis not present

## 2019-08-22 DIAGNOSIS — F17210 Nicotine dependence, cigarettes, uncomplicated: Secondary | ICD-10-CM | POA: Diagnosis not present

## 2019-08-22 DIAGNOSIS — Z8781 Personal history of (healed) traumatic fracture: Secondary | ICD-10-CM | POA: Diagnosis not present

## 2019-08-22 DIAGNOSIS — G8929 Other chronic pain: Secondary | ICD-10-CM | POA: Diagnosis not present

## 2019-08-22 DIAGNOSIS — M8580 Other specified disorders of bone density and structure, unspecified site: Secondary | ICD-10-CM | POA: Diagnosis not present

## 2019-08-22 DIAGNOSIS — K592 Neurogenic bowel, not elsewhere classified: Secondary | ICD-10-CM | POA: Diagnosis not present

## 2019-08-22 DIAGNOSIS — N200 Calculus of kidney: Secondary | ICD-10-CM | POA: Diagnosis not present

## 2019-08-22 DIAGNOSIS — E785 Hyperlipidemia, unspecified: Secondary | ICD-10-CM | POA: Diagnosis not present

## 2019-08-22 DIAGNOSIS — M62838 Other muscle spasm: Secondary | ICD-10-CM | POA: Diagnosis not present

## 2019-08-22 DIAGNOSIS — K5900 Constipation, unspecified: Secondary | ICD-10-CM | POA: Diagnosis not present

## 2019-08-22 DIAGNOSIS — N319 Neuromuscular dysfunction of bladder, unspecified: Secondary | ICD-10-CM | POA: Diagnosis not present

## 2019-08-22 DIAGNOSIS — G609 Hereditary and idiopathic neuropathy, unspecified: Secondary | ICD-10-CM | POA: Diagnosis not present

## 2019-08-22 DIAGNOSIS — G35 Multiple sclerosis: Secondary | ICD-10-CM | POA: Diagnosis not present

## 2019-08-22 DIAGNOSIS — I10 Essential (primary) hypertension: Secondary | ICD-10-CM | POA: Diagnosis not present

## 2019-08-22 DIAGNOSIS — M25512 Pain in left shoulder: Secondary | ICD-10-CM | POA: Diagnosis not present

## 2019-08-25 ENCOUNTER — Ambulatory Visit: Payer: PPO

## 2019-08-26 ENCOUNTER — Ambulatory Visit: Payer: PPO

## 2019-08-28 ENCOUNTER — Ambulatory Visit: Payer: PPO

## 2019-09-02 ENCOUNTER — Ambulatory Visit: Payer: PPO

## 2019-09-04 ENCOUNTER — Ambulatory Visit: Payer: PPO

## 2019-09-09 ENCOUNTER — Ambulatory Visit: Payer: PPO

## 2019-09-11 ENCOUNTER — Ambulatory Visit: Payer: PPO

## 2019-09-12 DIAGNOSIS — G609 Hereditary and idiopathic neuropathy, unspecified: Secondary | ICD-10-CM | POA: Diagnosis not present

## 2019-09-12 DIAGNOSIS — M25512 Pain in left shoulder: Secondary | ICD-10-CM | POA: Diagnosis not present

## 2019-09-12 DIAGNOSIS — G300 Alzheimer's disease with early onset: Secondary | ICD-10-CM | POA: Diagnosis not present

## 2019-09-12 DIAGNOSIS — I10 Essential (primary) hypertension: Secondary | ICD-10-CM | POA: Diagnosis not present

## 2019-09-12 DIAGNOSIS — G8929 Other chronic pain: Secondary | ICD-10-CM | POA: Diagnosis not present

## 2019-09-12 DIAGNOSIS — N319 Neuromuscular dysfunction of bladder, unspecified: Secondary | ICD-10-CM | POA: Diagnosis not present

## 2019-09-12 DIAGNOSIS — E785 Hyperlipidemia, unspecified: Secondary | ICD-10-CM | POA: Diagnosis not present

## 2019-09-13 DIAGNOSIS — K592 Neurogenic bowel, not elsewhere classified: Secondary | ICD-10-CM | POA: Diagnosis not present

## 2019-09-13 DIAGNOSIS — M62838 Other muscle spasm: Secondary | ICD-10-CM | POA: Diagnosis not present

## 2019-09-13 DIAGNOSIS — M8580 Other specified disorders of bone density and structure, unspecified site: Secondary | ICD-10-CM | POA: Diagnosis not present

## 2019-09-13 DIAGNOSIS — K5900 Constipation, unspecified: Secondary | ICD-10-CM | POA: Diagnosis not present

## 2019-09-13 DIAGNOSIS — E785 Hyperlipidemia, unspecified: Secondary | ICD-10-CM | POA: Diagnosis not present

## 2019-09-13 DIAGNOSIS — G35 Multiple sclerosis: Secondary | ICD-10-CM | POA: Diagnosis not present

## 2019-09-13 DIAGNOSIS — N393 Stress incontinence (female) (male): Secondary | ICD-10-CM | POA: Diagnosis not present

## 2019-09-13 DIAGNOSIS — F17210 Nicotine dependence, cigarettes, uncomplicated: Secondary | ICD-10-CM | POA: Diagnosis not present

## 2019-09-13 DIAGNOSIS — G609 Hereditary and idiopathic neuropathy, unspecified: Secondary | ICD-10-CM | POA: Diagnosis not present

## 2019-09-13 DIAGNOSIS — I10 Essential (primary) hypertension: Secondary | ICD-10-CM | POA: Diagnosis not present

## 2019-09-13 DIAGNOSIS — M25512 Pain in left shoulder: Secondary | ICD-10-CM | POA: Diagnosis not present

## 2019-09-13 DIAGNOSIS — N319 Neuromuscular dysfunction of bladder, unspecified: Secondary | ICD-10-CM | POA: Diagnosis not present

## 2019-09-13 DIAGNOSIS — Z8781 Personal history of (healed) traumatic fracture: Secondary | ICD-10-CM | POA: Diagnosis not present

## 2019-09-13 DIAGNOSIS — N200 Calculus of kidney: Secondary | ICD-10-CM | POA: Diagnosis not present

## 2019-09-13 DIAGNOSIS — G8929 Other chronic pain: Secondary | ICD-10-CM | POA: Diagnosis not present

## 2019-09-16 ENCOUNTER — Ambulatory Visit: Payer: PPO

## 2019-09-18 ENCOUNTER — Ambulatory Visit: Payer: PPO

## 2019-09-22 ENCOUNTER — Encounter: Payer: PPO | Admitting: Physical Medicine & Rehabilitation

## 2019-09-23 ENCOUNTER — Other Ambulatory Visit: Payer: Self-pay

## 2019-09-23 ENCOUNTER — Ambulatory Visit: Payer: PPO | Attending: Neurology

## 2019-09-23 DIAGNOSIS — R2681 Unsteadiness on feet: Secondary | ICD-10-CM | POA: Diagnosis not present

## 2019-09-23 DIAGNOSIS — G35 Multiple sclerosis: Secondary | ICD-10-CM | POA: Insufficient documentation

## 2019-09-23 DIAGNOSIS — R2689 Other abnormalities of gait and mobility: Secondary | ICD-10-CM

## 2019-09-23 DIAGNOSIS — M6281 Muscle weakness (generalized): Secondary | ICD-10-CM | POA: Diagnosis not present

## 2019-09-23 NOTE — Therapy (Signed)
[facility] MAIN [facility] SERVICES [address] [place], [place], [address] Phone: [phone]   Fax:  [phone]  Physical Therapy Evaluation  Patient Details  Name: Lisa Williams MRN: [id number] Date of Birth: 11-Nov-1960 Referring Provider (PT): Theora Master, MD   Encounter Date: 09/23/2019  PT End of Session - 09/24/19 1133    Visit Number  1    Number of Visits  24    Date for PT Re-Evaluation  12/16/19    Authorization Type  1/10 eval 5/18    PT Start Time  1015    PT Stop Time  1100    PT Time Calculation (min)  45 min    Equipment Utilized During Treatment  Gait belt;Other (comment)   bilateral AFOs   Activity Tolerance  Patient tolerated treatment well    Behavior During Therapy  WFL for tasks assessed/performed       Past Medical History:  Diagnosis Date  . Abdominal pain, right upper quadrant   . Back pain   . Calculus of kidney 12/09/2013  . Chronic back pain    unspecified  . Chronic left shoulder pain 07/19/2015  . Functional disorder of bladder    other  . Galactorrhea 11/26/2014   Chronic   . Hereditary and idiopathic neuropathy 08/19/2013  . HPV test positive   . Hypercholesteremia 08/19/2013  . Incomplete bladder emptying   . Microscopic hematuria   . MS (multiple sclerosis) (HCC)   . Muscle spasticity 05/21/2014  . Nonspecific findings on examination of urine    other  . Osteopenia   . Status post laparoscopic supracervical hysterectomy 11/26/2014  . Tobacco user 11/26/2014  . Wrist fracture     Past Surgical History:  Procedure Laterality Date  . bilateral tubal ligation  1996  . BREAST CYST EXCISION Left 2002  . KNEE SURGERY     right  . LAPAROSCOPIC SUPRACERVICAL HYSTERECTOMY  08/05/2013  . ORIF WRIST FRACTURE Left 01/17/2017   Procedure: OPEN REDUCTION INTERNAL FIXATION (ORIF) WRIST FRACTURE;  Surgeon: Lyndle Herrlich, MD;  Location: [facility] ORS;  Service: Orthopedics;  Laterality: Left;  . TUBAL  LIGATION Bilateral   . VAGINAL HYSTERECTOMY  03/2006    There were no vitals filed for this visit.   Subjective Assessment - 09/24/19 1130    Subjective  Patient is a pleasant 59 year old female who returns for evaluation s/p MS exacerbation.    Pertinent History  Patient is previous patient of this therapist, was admitted to the hospital on 08/03/19 for MS exacerbation and then went to inpatient rehab on 08/08/19. Patient then received home health therapy and now is returning to outpatient PT. PMh includes HTN, SVT, neurogenic bowel, MS (1995), neuropathy, hx of wrist fx, HPV, Hypercholesteremia, adiposity, osteopenia, spasticity. She wears bilateral AFOs and uses a RW and/or wheelchair for mobility. She drives an adapted car.    Limitations  Lifting;Standing;Walking;House hold activities    How long can you sit comfortably?  n/a    How long can you stand comfortably?  4 minutes    How long can you walk comfortably?  walked 50 ft in inpatient rehab    Patient Stated Goals  return to PLOF, increase walking, get into shower, increase mobility    Currently in Pain?  No/denies            PAIN: Patient reports no pain, just occasional stiffness  POSTURE: Seated: forward trunk lean, RLE slightly externally rotated Standing: forward trunk  lean, heavy reliance upon UE's/AD for movement, external rotation of bilateral feet with RLE > LLE.   PROM/AROM: Limited hip flexion bilaterally  STRENGTH:  Graded on a 0-5 scale Muscle Group Left Right  Shoulder flex 4/5 4/5  Shoulder Abd 4/5 4/5  Shoulder Ext 4/5 4/5  Shoulder IR/ER 4/5 4/5  Elbow 4/5 4/5  Wrist/hand 4-/5 4-/5  Hip Flex 2-/5 2-/5  Hip Abd 2-/5 2-/5  Hip Add 2-/5 2-/5  Hip Ext 2-/5 2-/5  Hip IR/ER 2-/5 2-/5  Knee Flex 1+/5 1+/5  Knee Ext 2-/5 2-/5  Ankle DF 1/5 1/5  Ankle PF 1/5 1/5   SENSATION: Limited distally   SPECIAL TESTS: Coordination: limited by muscle strength  FUNCTIONAL MOBILITY: Sit to stand, heavy  reliance upon UE's for transition, Tricep push Weight shift in chair: tricep press up utilized   BALANCE: Dynamic Sitting Balance  Normal Able to sit unsupported and weight shift across midline maximally   Good Able to sit unsupported and weight shift across midline moderately   Good-/Fair+ Able to sit unsupported and weight shift across midline minimally   Fair Minimal weight shifting ipsilateral/front, difficulty crossing midline x  Fair- Reach to ipsilateral side and unable to weight shift   Poor + Able to sit unsupported with min A and reach to ipsilateral side, unable to weight shift   Poor Able to sit unsupported with mod A and reach ipsilateral/front-can't cross midline     Standing Dynamic Balance  Normal Stand independently unsupported, able to weight shift and cross midline maximally   Good Stand independently unsupported, able to weight shift and cross midline moderately   Good-/Fair+ Stand independently unsupported, able to weight shift across midline minimally   Fair Stand independently unsupported, weight shift, and reach ipsilaterally, loss of balance when crossing midline   Poor+ Able to stand with Min A and reach ipsilaterally, unable to weight shift   Poor Able to stand with Mod A and minimally reach ipsilaterally, unable to cross midline. x    Static Sitting Balance  Normal Able to maintain balance against maximal resistance   Good Able to maintain balance against moderate resistance   Good-/Fair+ Accepts minimal resistance x  Fair Able to sit unsupported without balance loss and without UE support   Poor+ Able to maintain with Minimal assistance from individual or chair   Poor Unable to maintain balance-requires mod/max support from individual or chair     Static Standing Balance  Normal Able to maintain standing balance against maximal resistance   Good Able to maintain standing balance against moderate resistance   Good-/Fair+ Able to maintain standing balance  against minimal resistance   Fair Able to stand unsupported without UE support and without LOB for 1-2 min   Fair- Requires Min A and UE support to maintain standing without loss of balance x  Poor+ Requires mod A and UE support to maintain standing without loss of balance   Poor Requires max A and UE support to maintain standing balance without loss       GAIT: Patient ambulates with RW, bilateral AFO's. Forward trunk lean with limited foot clearance bilaterally. Limited hip extension with a thrust technique utilized for foot propulsion.   OUTCOME MEASURES: TEST Outcome Interpretation  5 times sit<>stand 43 sec heavy use of UE's, knees adducted together.  >81 yo, >15 sec indicates increased risk for falls  10 meter walk test           42 sec  m/s <1.0 m/s indicates increased risk for falls; limited community ambulator  Timed up and Go  47               sec <14 sec indicates increased risk for falls  FOTO 48/100 Discharge goal score of 53                    Objective measurements completed on examination: See above findings.              PT Education - 09/24/19 1132    Education provided  Yes    Education Details  POC, goals,    Person(s) Educated  Patient    Methods  Explanation;Demonstration;Tactile cues;Verbal cues    Comprehension  Verbalized understanding;Returned demonstration;Verbal cues required;Tactile cues required       PT Short Term Goals - 09/24/19 1241      PT SHORT TERM GOAL #1   Title  Patient will be independent in home exercise program to improve strength/mobility for better functional independence with ADLs.    Baseline  5/19: HEP next session    Time  4    Period  Weeks    Status  New    Target Date  10/22/19      PT SHORT TERM GOAL #2   Title  Patient will perform a sit to stand with SUE support for increased ind. with transfers.    Baseline  5/19: heavy BUE support    Time  4    Period  Weeks    Status  New    Target Date   10/22/19        PT Long Term Goals - 09/24/19 0001      PT LONG TERM GOAL #1   Title  Patient will increase FOTO score to equal to or greater than  53/100   to demonstrate statistically significant improvement in mobility and quality of life.     Baseline  5/18: 48/100    Time  12    Period  Weeks    Status  New    Target Date  12/16/19      PT LONG TERM GOAL #2   Title  Patient (< 80 years old) will complete five times sit to stand test in < 20 seconds indicating an increased LE strength and improved balance.    Baseline  5/19: 42 seconds     Time  12    Period  Weeks    Status  New    Target Date  12/16/19      PT LONG TERM GOAL #3   Title  Patient will increase 10 meter walk test to <20 seconds as to improve gait speed for better community ambulation and to reduce fall risk.    Baseline  5/19: 42 seconds    Time  12    Period  Weeks    Status  New    Target Date  12/16/19      PT LONG TERM GOAL #4   Title  Patient will reduce timed up and go to <11 seconds to reduce fall risk and demonstrate improved transfer/gait ability.    Baseline  5/19: 47 seconds    Time  12    Period  Weeks    Status  New    Target Date  12/16/19             Plan - 09/24/19 1236    Clinical Impression Statement  Patient is a  very pleasant, very motivated 59 year old female who is returning to outpatient PT s/p hospitalization and inpatient rehab for MS exacerbation. Patient has decreased transfer velocity and requires use of UE's, object negotiation and capacity for prolonged ambulation is a challenge for patient. 5x STs performed in 43 seconds with heavy use of UE's and knees adducted, 10 MWT performed in 42 seconds with use of RW, TUG 47 seconds with challenge during turning portions, and FOTO score of 48/100. Patient will benefit from skilled physical therapy to improve strength, transfers, and capacity for functional mobility to return to PLOF and maximize quality of life.    Personal  Factors and Comorbidities  Age;Comorbidity 3+;Fitness;Past/Current Experience;Sex;Time since onset of injury/illness/exacerbation    Comorbidities  HTN, SVT, neurogenic bowel, MS (1995), neuropathy, hx of wrist fx, HPV, Hypercholesteremia, adiposity, osteopenia, spasticity.    Examination-Activity Limitations  Bathing;Bed Mobility;Bend;Carry;Continence;Dressing;Hygiene/Grooming;Stairs;Squat;Reach Overhead;Locomotion Level;Lift;Stand;Transfers    Examination-Participation Restrictions  Church;Cleaning;Community Activity;Laundry;Volunteer;Shop;Meal Prep;Yard Work    Conservation officer, historic buildings  Evolving/Moderate complexity    Clinical Decision Making  Moderate    Rehab Potential  Good    PT Frequency  2x / week    PT Duration  12 weeks    PT Treatment/Interventions  ADLs/Self Care Home Management;Aquatic Therapy;Biofeedback;Cryotherapy;Electrical Stimulation;Iontophoresis 4mg /ml Dexamethasone;Traction;Moist Heat;Ultrasound;DME Instruction;Gait training;Stair training;Functional mobility training;Therapeutic activities;Therapeutic exercise;Balance training;Neuromuscular re-education;Patient/family education;Orthotic Fit/Training;Wheelchair mobility training;Manual techniques;Compression bandaging;Passive range of motion;Dry needling;Energy conservation;Taping;Splinting;Vestibular    PT Next Visit Plan  step negotiation    PT Home Exercise Plan  maintain current HEP from HHPT    Consulted and Agree with Plan of Care  Patient       Patient will benefit from skilled therapeutic intervention in order to improve the following deficits and impairments:  Abnormal gait, Decreased activity tolerance, Decreased balance, Decreased coordination, Decreased endurance, Decreased mobility, Decreased range of motion, Decreased strength, Difficulty walking, Impaired perceived functional ability, Impaired sensation, Impaired tone, Impaired UE functional use, Improper body mechanics, Postural dysfunction,  Cardiopulmonary status limiting activity, Impaired flexibility, Increased muscle spasms  Visit Diagnosis: Multiple sclerosis exacerbation (HCC)  Muscle weakness (generalized)  Other abnormalities of gait and mobility     Problem List Patient Active Problem List   Diagnosis Date Noted  . Hypoalbuminemia due to protein-calorie malnutrition (HCC)   . Neurogenic bowel   . Neurogenic bladder   . Labile blood pressure   . Neuropathic pain   . Abscess of female pelvis   . SVT (supraventricular tachycardia) (HCC)   . Radial styloid tenosynovitis 03/12/2018  . Wheelchair confinement 02/27/2018  . Localized osteoporosis with current pathological fracture with routine healing 01/19/2017  . Wrist fracture 01/16/2017  . Sprain of ankle 03/23/2016  . Closed fracture of lateral malleolus 03/16/2016  . Health care maintenance 01/24/2016  . Blood pressure elevated without history of HTN 10/25/2015  . Essential hypertension 10/25/2015  . Multiple sclerosis (HCC) 10/02/2015  . Chronic left shoulder pain 07/19/2015  . Multiple sclerosis exacerbation (HCC) 07/14/2015  . MS (multiple sclerosis) (HCC) 11/26/2014  . Increased body mass index 11/26/2014  . HPV test positive 11/26/2014  . Status post laparoscopic supracervical hysterectomy 11/26/2014  . Galactorrhea 11/26/2014  . Back ache 05/21/2014  . Adiposity 05/21/2014  . Disordered sleep 05/21/2014  . Muscle spasticity 05/21/2014  . Spasticity 05/21/2014  . Calculus of kidney 12/09/2013  . Renal colic 12/09/2013  . Hypercholesteremia 08/19/2013  . Hereditary and idiopathic neuropathy 08/19/2013  . Hypercholesterolemia without hypertriglyceridemia 08/19/2013  . Bladder infection, chronic 07/25/2012  . Disorder of bladder function  07/25/2012  . Incomplete bladder emptying 07/25/2012  . Microscopic hematuria 07/25/2012  . Right upper quadrant pain 07/25/2012   Precious Bard, PT, DPT   09/24/2019, 1:02 PM  [facility] MAIN [facility] SERVICES [address] [place], [place], [address] Phone: [phone]   Fax:  [phone]  Name: Lisa Williams MRN: [id number] Date of Birth: 12-01-1960

## 2019-09-25 ENCOUNTER — Other Ambulatory Visit: Payer: Self-pay

## 2019-09-25 ENCOUNTER — Ambulatory Visit: Payer: PPO

## 2019-09-25 DIAGNOSIS — R2681 Unsteadiness on feet: Secondary | ICD-10-CM

## 2019-09-25 DIAGNOSIS — G35 Multiple sclerosis: Secondary | ICD-10-CM | POA: Diagnosis not present

## 2019-09-25 DIAGNOSIS — R2689 Other abnormalities of gait and mobility: Secondary | ICD-10-CM

## 2019-09-25 DIAGNOSIS — M6281 Muscle weakness (generalized): Secondary | ICD-10-CM

## 2019-09-25 NOTE — Therapy (Signed)
[facility] MAIN [facility] SERVICES [address] [place], [place], [address] Phone: [phone]   Fax:  [phone]  Physical Therapy Treatment  Patient Details  Name: Lisa Williams MRN: [id number] Date of Birth: 1960/12/03 Referring Provider (PT): Theora Master, MD   Encounter Date: 09/25/2019  PT End of Session - 09/25/19 1108    Visit Number  2    Number of Visits  24    Date for PT Re-Evaluation  12/16/19    Authorization Type  2/10 eval 5/18    PT Start Time  1015    PT Stop Time  1059    PT Time Calculation (min)  44 min    Equipment Utilized During Treatment  Gait belt;Other (comment)   bilateral AFOs   Activity Tolerance  Patient tolerated treatment well    Behavior During Therapy  WFL for tasks assessed/performed       Past Medical History:  Diagnosis Date  . Abdominal pain, right upper quadrant   . Back pain   . Calculus of kidney 12/09/2013  . Chronic back pain    unspecified  . Chronic left shoulder pain 07/19/2015  . Functional disorder of bladder    other  . Galactorrhea 11/26/2014   Chronic   . Hereditary and idiopathic neuropathy 08/19/2013  . HPV test positive   . Hypercholesteremia 08/19/2013  . Incomplete bladder emptying   . Microscopic hematuria   . MS (multiple sclerosis) (HCC)   . Muscle spasticity 05/21/2014  . Nonspecific findings on examination of urine    other  . Osteopenia   . Status post laparoscopic supracervical hysterectomy 11/26/2014  . Tobacco user 11/26/2014  . Wrist fracture     Past Surgical History:  Procedure Laterality Date  . bilateral tubal ligation  1996  . BREAST CYST EXCISION Left 2002  . KNEE SURGERY     right  . LAPAROSCOPIC SUPRACERVICAL HYSTERECTOMY  08/05/2013  . ORIF WRIST FRACTURE Left 01/17/2017   Procedure: OPEN REDUCTION INTERNAL FIXATION (ORIF) WRIST FRACTURE;  Surgeon: Lyndle Herrlich, MD;  Location: [facility] ORS;  Service: Orthopedics;  Laterality: Left;  . TUBAL  LIGATION Bilateral   . VAGINAL HYSTERECTOMY  03/2006    There were no vitals filed for this visit.  Subjective Assessment - 09/25/19 1107    Subjective  Patient reports no falls or LOB, is overheated from outside temperature.    Pertinent History  Patient is previous patient of this therapist, was admitted to the hospital on 08/03/19 for MS exacerbation and then went to inpatient rehab on 08/08/19. Patient then received home health therapy and now is returning to outpatient PT. PMh includes HTN, SVT, neurogenic bowel, MS (1995), neuropathy, hx of wrist fx, HPV, Hypercholesteremia, adiposity, osteopenia, spasticity. She wears bilateral AFOs and uses a RW and/or wheelchair for mobility. She drives an adapted car.    Limitations  Lifting;Standing;Walking;House hold activities    How long can you sit comfortably?  n/a    How long can you stand comfortably?  4 minutes    How long can you walk comfortably?  walked 50 ft in inpatient rehab    Patient Stated Goals  return to PLOF, increase walking, get into shower, increase mobility    Currently in Pain?  No/denies             In // bars: CGA with cueing for body mechanics and sequencing for stabilization and mobility  weight shift in // bars 10x each side  Bilateral knee flexion into theraband with UE support 5x each LE  Step forward and backwards 8x each LE with heavy BUE support ; seated rest break between LE's  Static stand throwing ball at target x 6 before trembling results in needing to sit down and rest. SUE support required  Ambulate in hallway with RW: Close CGA with w/c follow. Use of cues for shifting weight and lifting LE for limb progression 40 ft + 20 ft with seated rest break  Seated exercise: Weighted ball (3000 gr) PNF pattern with lateral trunk flexion/extension overexaggeration 8x each direction Forward trunk lean with open prayer arm raise 10x each LE         Seated rest breaks and use of ice pack utilized to keep body  temperature at functional range to reduce MS exacerbation.      Pt educated throughout session about proper posture and technique with exercises. Improved exercise technique, movement at target joints, use of target muscles after min to mod verbal, visual, tactile cues.  Patient presents with excellent motivation despite fatigue from outside higher temperature. Patient fatigued quickly and demonstrated limited capacity for functional mobility due to fatigue. The patient would benefit from further skilled PT intervention to maximize QOL, safety, and functional mobility                  PT Education - 09/25/19 1108    Education provided  Yes    Education Details  exercise technique, body mechanics    Person(s) Educated  Patient    Methods  Explanation;Demonstration;Tactile cues;Verbal cues    Comprehension  Verbalized understanding;Returned demonstration;Verbal cues required;Tactile cues required       PT Short Term Goals - 09/24/19 1241      PT SHORT TERM GOAL #1   Title  Patient will be independent in home exercise program to improve strength/mobility for better functional independence with ADLs.    Baseline  5/19: HEP next session    Time  4    Period  Weeks    Status  New    Target Date  10/22/19      PT SHORT TERM GOAL #2   Title  Patient will perform a sit to stand with SUE support for increased ind. with transfers.    Baseline  5/19: heavy BUE support    Time  4    Period  Weeks    Status  New    Target Date  10/22/19        PT Long Term Goals - 09/24/19 0001      PT LONG TERM GOAL #1   Title  Patient will increase FOTO score to equal to or greater than  53/100   to demonstrate statistically significant improvement in mobility and quality of life.     Baseline  5/18: 48/100    Time  12    Period  Weeks    Status  New    Target Date  12/16/19      PT LONG TERM GOAL #2   Title  Patient (< 56 years old) will complete five times sit to stand test in <  20 seconds indicating an increased LE strength and improved balance.    Baseline  5/19: 42 seconds     Time  12    Period  Weeks    Status  New    Target Date  12/16/19      PT LONG TERM GOAL #3   Title  Patient will  increase 10 meter walk test to <20 seconds as to improve gait speed for better community ambulation and to reduce fall risk.    Baseline  5/19: 42 seconds    Time  12    Period  Weeks    Status  New    Target Date  12/16/19      PT LONG TERM GOAL #4   Title  Patient will reduce timed up and go to <11 seconds to reduce fall risk and demonstrate improved transfer/gait ability.    Baseline  5/19: 47 seconds    Time  12    Period  Weeks    Status  New    Target Date  12/16/19            Plan - 09/25/19 1109    Clinical Impression Statement  Patient presents with excellent motivation despite fatigue from outside higher temperature. Patient fatigued quickly and demonstrated limited capacity for functional mobility due to fatigue. The patient would benefit from further skilled PT intervention to maximize QOL, safety, and functional mobility    Personal Factors and Comorbidities  Age;Comorbidity 3+;Fitness;Past/Current Experience;Sex;Time since onset of injury/illness/exacerbation    Comorbidities  HTN, SVT, neurogenic bowel, MS (1995), neuropathy, hx of wrist fx, HPV, Hypercholesteremia, adiposity, osteopenia, spasticity.    Examination-Activity Limitations  Bathing;Bed Mobility;Bend;Carry;Continence;Dressing;Hygiene/Grooming;Stairs;Squat;Reach Overhead;Locomotion Level;Lift;Stand;Transfers    Examination-Participation Restrictions  Church;Cleaning;Community Activity;Laundry;Volunteer;Shop;Meal Prep;Yard Work    Conservation officer, historic buildings  Evolving/Moderate complexity    Rehab Potential  Good    PT Frequency  2x / week    PT Duration  12 weeks    PT Treatment/Interventions  ADLs/Self Care Home Management;Aquatic Therapy;Biofeedback;Cryotherapy;Electrical  Stimulation;Iontophoresis 4mg /ml Dexamethasone;Traction;Moist Heat;Ultrasound;DME Instruction;Gait training;Stair training;Functional mobility training;Therapeutic activities;Therapeutic exercise;Balance training;Neuromuscular re-education;Patient/family education;Orthotic Fit/Training;Wheelchair mobility training;Manual techniques;Compression bandaging;Passive range of motion;Dry needling;Energy conservation;Taping;Splinting;Vestibular    PT Next Visit Plan  step negotiation    PT Home Exercise Plan  maintain current HEP from HHPT    Consulted and Agree with Plan of Care  Patient       Patient will benefit from skilled therapeutic intervention in order to improve the following deficits and impairments:  Abnormal gait, Decreased activity tolerance, Decreased balance, Decreased coordination, Decreased endurance, Decreased mobility, Decreased range of motion, Decreased strength, Difficulty walking, Impaired perceived functional ability, Impaired sensation, Impaired tone, Impaired UE functional use, Improper body mechanics, Postural dysfunction, Cardiopulmonary status limiting activity, Impaired flexibility, Increased muscle spasms  Visit Diagnosis: Multiple sclerosis exacerbation (HCC)  Muscle weakness (generalized)  Other abnormalities of gait and mobility  Unsteadiness on feet     Problem List Patient Active Problem List   Diagnosis Date Noted  . Hypoalbuminemia due to protein-calorie malnutrition (HCC)   . Neurogenic bowel   . Neurogenic bladder   . Labile blood pressure   . Neuropathic pain   . Abscess of female pelvis   . SVT (supraventricular tachycardia) (HCC)   . Radial styloid tenosynovitis 03/12/2018  . Wheelchair confinement 02/27/2018  . Localized osteoporosis with current pathological fracture with routine healing 01/19/2017  . Wrist fracture 01/16/2017  . Sprain of ankle 03/23/2016  . Closed fracture of lateral malleolus 03/16/2016  . Health care maintenance  01/24/2016  . Blood pressure elevated without history of HTN 10/25/2015  . Essential hypertension 10/25/2015  . Multiple sclerosis (HCC) 10/02/2015  . Chronic left shoulder pain 07/19/2015  . Multiple sclerosis exacerbation (HCC) 07/14/2015  . MS (multiple sclerosis) (HCC) 11/26/2014  . Increased body mass index 11/26/2014  . HPV test positive 11/26/2014  .  Status post laparoscopic supracervical hysterectomy 11/26/2014  . Galactorrhea 11/26/2014  . Back ache 05/21/2014  . Adiposity 05/21/2014  . Disordered sleep 05/21/2014  . Muscle spasticity 05/21/2014  . Spasticity 05/21/2014  . Calculus of kidney 12/09/2013  . Renal colic 27/78/2423  . Hypercholesteremia 08/19/2013  . Hereditary and idiopathic neuropathy 08/19/2013  . Hypercholesterolemia without hypertriglyceridemia 08/19/2013  . Bladder infection, chronic 07/25/2012  . Disorder of bladder function 07/25/2012  . Incomplete bladder emptying 07/25/2012  . Microscopic hematuria 07/25/2012  . Right upper quadrant pain 07/25/2012   Janna Arch, PT, DPT   09/25/2019, 11:10 AM  [place] MAIN [facility] SERVICES [address] [place], [place], [address] Phone: [phone]   Fax:  [phone]  Name: Lisa Williams MRN: [id number] Date of Birth: [birth year]

## 2019-09-30 ENCOUNTER — Ambulatory Visit: Payer: PPO

## 2019-10-02 ENCOUNTER — Ambulatory Visit: Payer: PPO

## 2019-10-02 ENCOUNTER — Other Ambulatory Visit: Payer: Self-pay

## 2019-10-02 DIAGNOSIS — M6281 Muscle weakness (generalized): Secondary | ICD-10-CM

## 2019-10-02 DIAGNOSIS — R2689 Other abnormalities of gait and mobility: Secondary | ICD-10-CM

## 2019-10-02 DIAGNOSIS — G35 Multiple sclerosis: Secondary | ICD-10-CM

## 2019-10-02 DIAGNOSIS — R2681 Unsteadiness on feet: Secondary | ICD-10-CM

## 2019-10-02 NOTE — Therapy (Signed)
[facility] MAIN [facility] SERVICES [address] [place], [place], [address] Phone: [phone]   Fax:  [phone]  Physical Therapy Treatment  Patient Details  Name: Lisa Williams MRN: [id number] Date of Birth: 12-Jan-1961 Referring Provider (PT): Theora Master, MD   Encounter Date: 10/02/2019  PT End of Session - 10/02/19 1420    Visit Number  3    Number of Visits  24    Date for PT Re-Evaluation  12/16/19    Authorization Type  3/10 eval 5/18    PT Start Time  1015    PT Stop Time  1057    PT Time Calculation (min)  42 min    Equipment Utilized During Treatment  Gait belt;Other (comment)   bilateral AFOs   Activity Tolerance  Patient tolerated treatment well    Behavior During Therapy  WFL for tasks assessed/performed       Past Medical History:  Diagnosis Date  . Abdominal pain, right upper quadrant   . Back pain   . Calculus of kidney 12/09/2013  . Chronic back pain    unspecified  . Chronic left shoulder pain 07/19/2015  . Functional disorder of bladder    other  . Galactorrhea 11/26/2014   Chronic   . Hereditary and idiopathic neuropathy 08/19/2013  . HPV test positive   . Hypercholesteremia 08/19/2013  . Incomplete bladder emptying   . Microscopic hematuria   . MS (multiple sclerosis) (HCC)   . Muscle spasticity 05/21/2014  . Nonspecific findings on examination of urine    other  . Osteopenia   . Status post laparoscopic supracervical hysterectomy 11/26/2014  . Tobacco user 11/26/2014  . Wrist fracture     Past Surgical History:  Procedure Laterality Date  . bilateral tubal ligation  1996  . BREAST CYST EXCISION Left 2002  . KNEE SURGERY     right  . LAPAROSCOPIC SUPRACERVICAL HYSTERECTOMY  08/05/2013  . ORIF WRIST FRACTURE Left 01/17/2017   Procedure: OPEN REDUCTION INTERNAL FIXATION (ORIF) WRIST FRACTURE;  Surgeon: Lyndle Herrlich, MD;  Location: [facility] ORS;  Service: Orthopedics;  Laterality: Left;  . TUBAL  LIGATION Bilateral   . VAGINAL HYSTERECTOMY  03/2006    There were no vitals filed for this visit.  Subjective Assessment - 10/02/19 1419    Subjective  Patient presents with new neck fan to help her from overheating. Reports no falls or LOB since last session.    Pertinent History  Patient is previous patient of this therapist, was admitted to the hospital on 08/03/19 for MS exacerbation and then went to inpatient rehab on 08/08/19. Patient then received home health therapy and now is returning to outpatient PT. PMh includes HTN, SVT, neurogenic bowel, MS (1995), neuropathy, hx of wrist fx, HPV, Hypercholesteremia, adiposity, osteopenia, spasticity. She wears bilateral AFOs and uses a RW and/or wheelchair for mobility. She drives an adapted car.    Limitations  Lifting;Standing;Walking;House hold activities    How long can you sit comfortably?  n/a    How long can you stand comfortably?  4 minutes    How long can you walk comfortably?  walked 50 ft in inpatient rehab    Patient Stated Goals  return to PLOF, increase walking, get into shower, increase mobility    Currently in Pain?  No/denies          Treatment:      In // bars: CGA with cueing for body mechanics and sequencing for stabilization and  mobility  weight shift in // bars 10x each side   Static stand with TrA contraction 10x 3 second holds Bilateral knee flexion into theraband with UE support 5x each LE  Step forward and backwards 8x each LE with heavy BUE support ; seated rest break between LE's  Static stand throwing ball at target x 6 before trembling results in needing to sit down and rest. SUE support required  Step over 2inch band on ground; cues for hip flexion then forward progression. x10   Seated:  Balloon taps reaching inside/outside BOS  Use of an ice pack between interventions assists in cooling patient's temperature down to allow reduction of overheating and improve capacity for tolerance of interventions.     Pt educated throughout session about proper posture and technique with exercises. Improved exercise technique, movement at target joints, use of target muscles after min to mod verbal, visual, tactile cues.                 PT Education - 10/02/19 1419    Education provided  Yes    Education Details  exercise technique, body mechanics    Person(s) Educated  Patient    Methods  Explanation;Demonstration;Tactile cues;Verbal cues    Comprehension  Verbalized understanding;Returned demonstration;Verbal cues required;Tactile cues required       PT Short Term Goals - 09/24/19 1241      PT SHORT TERM GOAL #1   Title  Patient will be independent in home exercise program to improve strength/mobility for better functional independence with ADLs.    Baseline  5/19: HEP next session    Time  4    Period  Weeks    Status  New    Target Date  10/22/19      PT SHORT TERM GOAL #2   Title  Patient will perform a sit to stand with SUE support for increased ind. with transfers.    Baseline  5/19: heavy BUE support    Time  4    Period  Weeks    Status  New    Target Date  10/22/19        PT Long Term Goals - 09/24/19 0001      PT LONG TERM GOAL #1   Title  Patient will increase FOTO score to equal to or greater than  53/100   to demonstrate statistically significant improvement in mobility and quality of life.     Baseline  5/18: 48/100    Time  12    Period  Weeks    Status  New    Target Date  12/16/19      PT LONG TERM GOAL #2   Title  Patient (< 9 years old) will complete five times sit to stand test in < 20 seconds indicating an increased LE strength and improved balance.    Baseline  5/19: 42 seconds     Time  12    Period  Weeks    Status  New    Target Date  12/16/19      PT LONG TERM GOAL #3   Title  Patient will increase 10 meter walk test to <20 seconds as to improve gait speed for better community ambulation and to reduce fall risk.    Baseline   5/19: 42 seconds    Time  12    Period  Weeks    Status  New    Target Date  12/16/19      PT  LONG TERM GOAL #4   Title  Patient will reduce timed up and go to <11 seconds to reduce fall risk and demonstrate improved transfer/gait ability.    Baseline  5/19: 47 seconds    Time  12    Period  Weeks    Status  New    Target Date  12/16/19            Plan - 10/02/19 1818    Clinical Impression Statement  Patient is challenged with sequencing required for step negotiation with frequent advancement of limb with knee extended rather than flexed. This improved with repetition and verbal cueing for sequencing. She continues to have excellent motivation throughout session despite fatigue. The patient would benefit from further skilled PT intervention to maximize QOL, safety, and functional mobility    Personal Factors and Comorbidities  Age;Comorbidity 3+;Fitness;Past/Current Experience;Sex;Time since onset of injury/illness/exacerbation    Comorbidities  HTN, SVT, neurogenic bowel, MS (1995), neuropathy, hx of wrist fx, HPV, Hypercholesteremia, adiposity, osteopenia, spasticity.    Examination-Activity Limitations  Bathing;Bed Mobility;Bend;Carry;Continence;Dressing;Hygiene/Grooming;Stairs;Squat;Reach Overhead;Locomotion Level;Lift;Stand;Transfers    Examination-Participation Restrictions  Church;Cleaning;Community Activity;Laundry;Volunteer;Shop;Meal Prep;Yard Work    Conservation officer, historic buildings  Evolving/Moderate complexity    Rehab Potential  Good    PT Frequency  2x / week    PT Duration  12 weeks    PT Treatment/Interventions  ADLs/Self Care Home Management;Aquatic Therapy;Biofeedback;Cryotherapy;Electrical Stimulation;Iontophoresis 4mg /ml Dexamethasone;Traction;Moist Heat;Ultrasound;DME Instruction;Gait training;Stair training;Functional mobility training;Therapeutic activities;Therapeutic exercise;Balance training;Neuromuscular re-education;Patient/family education;Orthotic  Fit/Training;Wheelchair mobility training;Manual techniques;Compression bandaging;Passive range of motion;Dry needling;Energy conservation;Taping;Splinting;Vestibular    PT Next Visit Plan  step negotiation    PT Home Exercise Plan  maintain current HEP from HHPT    Consulted and Agree with Plan of Care  Patient       Patient will benefit from skilled therapeutic intervention in order to improve the following deficits and impairments:  Abnormal gait, Decreased activity tolerance, Decreased balance, Decreased coordination, Decreased endurance, Decreased mobility, Decreased range of motion, Decreased strength, Difficulty walking, Impaired perceived functional ability, Impaired sensation, Impaired tone, Impaired UE functional use, Improper body mechanics, Postural dysfunction, Cardiopulmonary status limiting activity, Impaired flexibility, Increased muscle spasms  Visit Diagnosis: Multiple sclerosis exacerbation (HCC)  Muscle weakness (generalized)  Other abnormalities of gait and mobility  Unsteadiness on feet     Problem List Patient Active Problem List   Diagnosis Date Noted  . Hypoalbuminemia due to protein-calorie malnutrition (HCC)   . Neurogenic bowel   . Neurogenic bladder   . Labile blood pressure   . Neuropathic pain   . Abscess of female pelvis   . SVT (supraventricular tachycardia) (HCC)   . Radial styloid tenosynovitis 03/12/2018  . Wheelchair confinement 02/27/2018  . Localized osteoporosis with current pathological fracture with routine healing 01/19/2017  . Wrist fracture 01/16/2017  . Sprain of ankle 03/23/2016  . Closed fracture of lateral malleolus 03/16/2016  . Health care maintenance 01/24/2016  . Blood pressure elevated without history of HTN 10/25/2015  . Essential hypertension 10/25/2015  . Multiple sclerosis (HCC) 10/02/2015  . Chronic left shoulder pain 07/19/2015  . Multiple sclerosis exacerbation (HCC) 07/14/2015  . MS (multiple sclerosis) (HCC)  11/26/2014  . Increased body mass index 11/26/2014  . HPV test positive 11/26/2014  . Status post laparoscopic supracervical hysterectomy 11/26/2014  . Galactorrhea 11/26/2014  . Back ache 05/21/2014  . Adiposity 05/21/2014  . Disordered sleep 05/21/2014  . Muscle spasticity 05/21/2014  . Spasticity 05/21/2014  . Calculus of kidney 12/09/2013  . Renal colic 12/09/2013  .  Hypercholesteremia 08/19/2013  . Hereditary and idiopathic neuropathy 08/19/2013  . Hypercholesterolemia without hypertriglyceridemia 08/19/2013  . Bladder infection, chronic 07/25/2012  . Disorder of bladder function 07/25/2012  . Incomplete bladder emptying 07/25/2012  . Microscopic hematuria 07/25/2012  . Right upper quadrant pain 07/25/2012   Precious Bard, PT, DPT   10/02/2019, 6:20 PM  [facility] MAIN [facility] SERVICES [address] [place], [place], [address] Phone: [phone]   Fax:  [phone]  Name: Oria Klimas Koy MRN: [id number] Date of Birth: 16-Jan-1961

## 2019-10-07 ENCOUNTER — Other Ambulatory Visit: Payer: Self-pay

## 2019-10-07 ENCOUNTER — Ambulatory Visit: Payer: PPO | Attending: Neurology

## 2019-10-07 DIAGNOSIS — R2681 Unsteadiness on feet: Secondary | ICD-10-CM | POA: Insufficient documentation

## 2019-10-07 DIAGNOSIS — R2689 Other abnormalities of gait and mobility: Secondary | ICD-10-CM | POA: Diagnosis not present

## 2019-10-07 DIAGNOSIS — G35 Multiple sclerosis: Secondary | ICD-10-CM | POA: Insufficient documentation

## 2019-10-07 DIAGNOSIS — M6281 Muscle weakness (generalized): Secondary | ICD-10-CM | POA: Insufficient documentation

## 2019-10-07 NOTE — Therapy (Signed)
[place] MAIN [facility] SERVICES [address] [place], [place], [address] Phone: [phone]   Fax:  [phone]  Physical Therapy Treatment  Patient Details  Name: Lisa Williams MRN: [id number] Date of Birth: 02-05-1961 Referring Provider (PT): Gurney Maxin, MD   Encounter Date: 10/07/2019  PT End of Session - 10/07/19 1013    Visit Number  4    Number of Visits  24    Date for PT Re-Evaluation  12/16/19    Authorization Type  4/10 eval 5/18    PT Start Time  1015    PT Stop Time  1059    PT Time Calculation (min)  44 min    Equipment Utilized During Treatment  Gait belt;Other (comment)   bilateral AFOs   Activity Tolerance  Patient tolerated treatment well    Behavior During Therapy  WFL for tasks assessed/performed       Past Medical History:  Diagnosis Date  . Abdominal pain, right upper quadrant   . Back pain   . Calculus of kidney 12/09/2013  . Chronic back pain    unspecified  . Chronic left shoulder pain 07/19/2015  . Functional disorder of bladder    other  . Galactorrhea 11/26/2014   Chronic   . Hereditary and idiopathic neuropathy 08/19/2013  . HPV test positive   . Hypercholesteremia 08/19/2013  . Incomplete bladder emptying   . Microscopic hematuria   . MS (multiple sclerosis) ([place])   . Muscle spasticity 05/21/2014  . Nonspecific findings on examination of urine    other  . Osteopenia   . Status post laparoscopic supracervical hysterectomy 11/26/2014  . Tobacco user 11/26/2014  . Wrist fracture     Past Surgical History:  Procedure Laterality Date  . bilateral tubal ligation  1996  . BREAST CYST EXCISION Left 2002  . KNEE SURGERY     right  . LAPAROSCOPIC SUPRACERVICAL HYSTERECTOMY  08/05/2013  . ORIF WRIST FRACTURE Left 01/17/2017   Procedure: OPEN REDUCTION INTERNAL FIXATION (ORIF) WRIST FRACTURE;  Surgeon: Lovell Sheehan, MD;  Location: [facility] ORS;  Service: Orthopedics;  Laterality: Left;  . TUBAL  LIGATION Bilateral   . VAGINAL HYSTERECTOMY  03/2006    There were no vitals filed for this visit.  Subjective Assessment - 10/07/19 1212    Subjective  Patient reports occasional spasms in R rib cage with movement. No falls or LOB since last session.    Pertinent History  Patient is previous patient of this therapist, was admitted to the hospital on 08/03/19 for MS exacerbation and then went to inpatient rehab on 08/08/19. Patient then received home health therapy and now is returning to outpatient PT. PMh includes HTN, SVT, neurogenic bowel, MS (1995), neuropathy, hx of wrist fx, HPV, Hypercholesteremia, adiposity, osteopenia, spasticity. She wears bilateral AFOs and uses a RW and/or wheelchair for mobility. She drives an adapted car.    Limitations  Lifting;Standing;Walking;House hold activities    How long can you sit comfortably?  n/a    How long can you stand comfortably?  4 minutes    How long can you walk comfortably?  walked 50 ft in inpatient rehab    Patient Stated Goals  return to PLOF, increase walking, get into shower, increase mobility    Currently in Pain?  No/denies              Treatment: Standing in // bars:  weight shifts L, R with focus on hip shift rather than pulling  with UE's 10x each side PVC pipe; forward tap backwards tap reaching 10x, very challenging Pool noodle holding in midline 10 seconds x 3 trials   Pain relief sequencing: Arm pull stretch in lateral side bend and reach 2x 30 seconds in lateral plane 2x 30 seconds in diagonal plane Scapular mobilizations into depression and retraction with glenohumeral extension/lateral abduction overpressure x 3 minutes PVC pipe lateral diagonal stretch push/pull 10x, forward 10x  Seated; GTB rows 15x; cues for chin tuck shoulder retraction and depression  GTB ER pulls with cueing for elbows at side. 15x  PVC pipe lat straight arm raises with posterior pelvic tilt w/o back support 10x airex pad (blue side  up) press down one LE at a time 15x; min A for putting leg onto airex pad.   Isometrics: -hip flexion into PT hands 12x 3 second holds each LE, single LE at a time -hip abduction into PT hands 12x 3 second holds  -hip adduction into PT hands 12x 3 second holds   Seated rest breaks and use of ice pack utilized to keep body temperature at functional range to reduce MS exacerbation.  Pt educated throughout session about proper posture and technique with exercises. Improved exercise technique, movement at target joints, use of target muscles after min to mod verbal, visual, tactile cues.                     PT Education - 10/07/19 1013    Education provided  Yes    Education Details  exercise technique, body mechanics    Person(s) Educated  Patient    Methods  Explanation;Demonstration;Tactile cues;Verbal cues    Comprehension  Verbalized understanding;Returned demonstration;Verbal cues required;Tactile cues required       PT Short Term Goals - 09/24/19 1241      PT SHORT TERM GOAL #1   Title  Patient will be independent in home exercise program to improve strength/mobility for better functional independence with ADLs.    Baseline  5/19: HEP next session    Time  4    Period  Weeks    Status  New    Target Date  10/22/19      PT SHORT TERM GOAL #2   Title  Patient will perform a sit to stand with SUE support for increased ind. with transfers.    Baseline  5/19: heavy BUE support    Time  4    Period  Weeks    Status  New    Target Date  10/22/19        PT Long Term Goals - 09/24/19 0001      PT LONG TERM GOAL #1   Title  Patient will increase FOTO score to equal to or greater than  53/100   to demonstrate statistically significant improvement in mobility and quality of life.     Baseline  5/18: 48/100    Time  12    Period  Weeks    Status  New    Target Date  12/16/19      PT LONG TERM GOAL #2   Title  Patient (< 27 years old) will  complete five times sit to stand test in < 20 seconds indicating an increased LE strength and improved balance.    Baseline  5/19: 42 seconds     Time  12    Period  Weeks    Status  New    Target Date  12/16/19  PT LONG TERM GOAL #3   Title  Patient will increase 10 meter walk test to <20 seconds as to improve gait speed for better community ambulation and to reduce fall risk.    Baseline  5/19: 42 seconds    Time  12    Period  Weeks    Status  New    Target Date  12/16/19      PT LONG TERM GOAL #4   Title  Patient will reduce timed up and go to <11 seconds to reduce fall risk and demonstrate improved transfer/gait ability.    Baseline  5/19: 47 seconds    Time  12    Period  Weeks    Status  New    Target Date  12/16/19            Plan - 10/07/19 1219    Clinical Impression Statement  Patient is progressing with static stability without LOB, this is very fatiguing to her. Occasional spasm of R trunk reduced with manual and therex with patient reporting decreased discomfort by end of session. She remains highly motivated throughout session. The patient would benefit from further skilled PT intervention to maximize QOL, safety, and functional mobility    Personal Factors and Comorbidities  Age;Comorbidity 3+;Fitness;Past/Current Experience;Sex;Time since onset of injury/illness/exacerbation    Comorbidities  HTN, SVT, neurogenic bowel, MS (1995), neuropathy, hx of wrist fx, HPV, Hypercholesteremia, adiposity, osteopenia, spasticity.    Examination-Activity Limitations  Bathing;Bed Mobility;Bend;Carry;Continence;Dressing;Hygiene/Grooming;Stairs;Squat;Reach Overhead;Locomotion Level;Lift;Stand;Transfers    Examination-Participation Restrictions  Church;Cleaning;Community Activity;Laundry;Volunteer;Shop;Meal Prep;Yard Work    Conservation officer, historic buildings  Evolving/Moderate complexity    Rehab Potential  Good    PT Frequency  2x / week    PT Duration  12 weeks    PT  Treatment/Interventions  ADLs/Self Care Home Management;Aquatic Therapy;Biofeedback;Cryotherapy;Electrical Stimulation;Iontophoresis 4mg /ml Dexamethasone;Traction;Moist Heat;Ultrasound;DME Instruction;Gait training;Stair training;Functional mobility training;Therapeutic activities;Therapeutic exercise;Balance training;Neuromuscular re-education;Patient/family education;Orthotic Fit/Training;Wheelchair mobility training;Manual techniques;Compression bandaging;Passive range of motion;Dry needling;Energy conservation;Taping;Splinting;Vestibular    PT Next Visit Plan  step negotiation    PT Home Exercise Plan  maintain current HEP from [facility]    Consulted and Agree with Plan of Care  Patient       Patient will benefit from skilled therapeutic intervention in order to improve the following deficits and impairments:  Abnormal gait, Decreased activity tolerance, Decreased balance, Decreased coordination, Decreased endurance, Decreased mobility, Decreased range of motion, Decreased strength, Difficulty walking, Impaired perceived functional ability, Impaired sensation, Impaired tone, Impaired UE functional use, Improper body mechanics, Postural dysfunction, Cardiopulmonary status limiting activity, Impaired flexibility, Increased muscle spasms  Visit Diagnosis: Multiple sclerosis exacerbation (HCC)  Muscle weakness (generalized)  Other abnormalities of gait and mobility  Unsteadiness on feet     Problem List Patient Active Problem List   Diagnosis Date Noted  . Hypoalbuminemia due to protein-calorie malnutrition (HCC)   . Neurogenic bowel   . Neurogenic bladder   . Labile blood pressure   . Neuropathic pain   . Abscess of female pelvis   . SVT (supraventricular tachycardia) (HCC)   . Radial styloid tenosynovitis 03/12/2018  . Wheelchair confinement 02/27/2018  . Localized osteoporosis with current pathological fracture with routine healing 01/19/2017  . Wrist fracture 01/16/2017  . Sprain of  ankle 03/23/2016  . Closed fracture of lateral malleolus 03/16/2016  . Health care maintenance 01/24/2016  . Blood pressure elevated without history of HTN 10/25/2015  . Essential hypertension 10/25/2015  . Multiple sclerosis (HCC) 10/02/2015  . Chronic left shoulder pain 07/19/2015  . Multiple  sclerosis exacerbation (HCC) 07/14/2015  . MS (multiple sclerosis) (HCC) 11/26/2014  . Increased body mass index 11/26/2014  . HPV test positive 11/26/2014  . Status post laparoscopic supracervical hysterectomy 11/26/2014  . Galactorrhea 11/26/2014  . Back ache 05/21/2014  . Adiposity 05/21/2014  . Disordered sleep 05/21/2014  . Muscle spasticity 05/21/2014  . Spasticity 05/21/2014  . Calculus of kidney 12/09/2013  . Renal colic 12/09/2013  . Hypercholesteremia 08/19/2013  . Hereditary and idiopathic neuropathy 08/19/2013  . Hypercholesterolemia without hypertriglyceridemia 08/19/2013  . Bladder infection, chronic 07/25/2012  . Disorder of bladder function 07/25/2012  . Incomplete bladder emptying 07/25/2012  . Microscopic hematuria 07/25/2012  . Right upper quadrant pain 07/25/2012   Precious Bard, PT, DPT   10/07/2019, 12:20 PM  [place] [facility] MAIN [facility] SERVICES [address] [place], [place], [address] Phone: [phone]   Fax:  [phone]  Name: Lisa Williams MRN: [id number] Date of Birth: Nov 28, 1960

## 2019-10-09 ENCOUNTER — Other Ambulatory Visit: Payer: Self-pay

## 2019-10-09 ENCOUNTER — Ambulatory Visit: Payer: PPO

## 2019-10-09 DIAGNOSIS — M6281 Muscle weakness (generalized): Secondary | ICD-10-CM

## 2019-10-09 DIAGNOSIS — R2689 Other abnormalities of gait and mobility: Secondary | ICD-10-CM

## 2019-10-09 DIAGNOSIS — G35 Multiple sclerosis: Secondary | ICD-10-CM | POA: Diagnosis not present

## 2019-10-09 NOTE — Therapy (Signed)
[place] [facility] MAIN [facility] SERVICES [address] [place], [place], [address] Phone: [phone]   Fax:  [phone]  Physical Therapy Treatment  Patient Details  Name: Lisa Williams MRN: [id number] Date of Birth: 1961-04-26 Referring Provider (PT): Theora Master, MD   Encounter Date: 10/09/2019  PT End of Session - 10/09/19 1338    Visit Number  5    Number of Visits  24    Date for PT Re-Evaluation  12/16/19    Authorization Type  5/10 eval 5/18    PT Start Time  1015    PT Stop Time  1100    PT Time Calculation (min)  45 min    Equipment Utilized During Treatment  Gait belt;Other (comment)   bilateral AFOs   Activity Tolerance  Patient tolerated treatment well    Behavior During Therapy  [facility] for tasks assessed/performed       Past Medical History:  Diagnosis Date   Abdominal pain, right upper quadrant    Back pain    Calculus of kidney 12/09/2013   Chronic back pain    unspecified   Chronic left shoulder pain 07/19/2015   Functional disorder of bladder    other   Galactorrhea 11/26/2014   Chronic    Hereditary and idiopathic neuropathy 08/19/2013   HPV test positive    Hypercholesteremia 08/19/2013   Incomplete bladder emptying    Microscopic hematuria    MS (multiple sclerosis) (HCC)    Muscle spasticity 05/21/2014   Nonspecific findings on examination of urine    other   Osteopenia    Status post laparoscopic supracervical hysterectomy 11/26/2014   Tobacco user 11/26/2014   Wrist fracture     Past Surgical History:  Procedure Laterality Date   bilateral tubal ligation  1996   BREAST CYST EXCISION Left 2002   KNEE SURGERY     right   LAPAROSCOPIC SUPRACERVICAL HYSTERECTOMY  08/05/2013   ORIF WRIST FRACTURE Left 01/17/2017   Procedure: OPEN REDUCTION INTERNAL FIXATION (ORIF) WRIST FRACTURE;  Surgeon: Lyndle Herrlich, MD;  Location: [facility] ORS;  Service: Orthopedics;  Laterality: Left;   TUBAL  LIGATION Bilateral    VAGINAL HYSTERECTOMY  03/2006    There were no vitals filed for this visit.  Subjective Assessment - 10/09/19 1336    Subjective  Pt reports she has not fallen since last session.  She focuses on energy conservation at home.  She does some walking with her FWW.    Pertinent History  Patient is previous patient of this therapist, was admitted to the hospital on 08/03/19 for MS exacerbation and then went to inpatient rehab on 08/08/19. Patient then received home health therapy and now is returning to outpatient PT. PMh includes HTN, SVT, neurogenic bowel, MS (1995), neuropathy, hx of wrist fx, HPV, Hypercholesteremia, adiposity, osteopenia, spasticity. She wears bilateral AFOs and uses a RW and/or wheelchair for mobility. She drives an adapted car.    Limitations  Lifting;Standing;Walking;House hold activities    How long can you sit comfortably?  n/a    How long can you stand comfortably?  4 minutes    How long can you walk comfortably?  walked 50 ft in inpatient rehab    Patient Stated Goals  return to PLOF, increase walking, get into shower, increase mobility    Currently in Pain?  No/denies       Treatment Today:   Standing in // bars: 2 trials: weight shifts L, R with focus on  hip shift rather than pulling with UE's 10x each side  Walking 50 ft with her FWW in hallway, PT SBA with gait belt and wheelchair follow.    Seated: GTB rows 15x; cues for chin tuck shoulder retraction and depression  GTB ER pulls with cueing for elbows at side. 15x  Isometrics: -hip flexion into PT hands 12x 3 second holds each LE, single LE at a time -hip abduction into PT hands 12x 3 second holds  -hip adduction into PT hands 12x 3 second holds   Seated rest breaks and use of ice pack utilized to keep body temperature at functional range to reduce MS exacerbation.      PT Short Term Goals - 09/24/19 1241      PT SHORT TERM GOAL #1   Title  Patient will be independent  in home exercise program to improve strength/mobility for better functional independence with ADLs.    Baseline  5/19: HEP next session    Time  4    Period  Weeks    Status  New    Target Date  10/22/19      PT SHORT TERM GOAL #2   Title  Patient will perform a sit to stand with SUE support for increased ind. with transfers.    Baseline  5/19: heavy BUE support    Time  4    Period  Weeks    Status  New    Target Date  10/22/19        PT Long Term Goals - 09/24/19 0001      PT LONG TERM GOAL #1   Title  Patient will increase FOTO score to equal to or greater than  53/100   to demonstrate statistically significant improvement in mobility and quality of life.     Baseline  5/18: 48/100    Time  12    Period  Weeks    Status  New    Target Date  12/16/19      PT LONG TERM GOAL #2   Title  Patient (< 6 years old) will complete five times sit to stand test in < 20 seconds indicating an increased LE strength and improved balance.    Baseline  5/19: 42 seconds     Time  12    Period  Weeks    Status  New    Target Date  12/16/19      PT LONG TERM GOAL #3   Title  Patient will increase 10 meter walk test to <20 seconds as to improve gait speed for better community ambulation and to reduce fall risk.    Baseline  5/19: 42 seconds    Time  12    Period  Weeks    Status  New    Target Date  12/16/19      PT LONG TERM GOAL #4   Title  Patient will reduce timed up and go to <11 seconds to reduce fall risk and demonstrate improved transfer/gait ability.    Baseline  5/19: 47 seconds    Time  12    Period  Weeks    Status  New    Target Date  12/16/19            Plan - 10/09/19 1339    Clinical Impression Statement  Pt able to amb 50 ft with FWW on flat surface in clinic today.  She is very motivated to participate in tx.  She fatigues quickly with  standing activities and does require frequent rest breaks during session.    Personal Factors and Comorbidities   Age;Comorbidity 3+;Fitness;Past/Current Experience;Sex;Time since onset of injury/illness/exacerbation    Comorbidities  HTN, SVT, neurogenic bowel, MS (1995), neuropathy, hx of wrist fx, HPV, Hypercholesteremia, adiposity, osteopenia, spasticity.    Examination-Activity Limitations  Bathing;Bed Mobility;Bend;Carry;Continence;Dressing;Hygiene/Grooming;Stairs;Squat;Reach Overhead;Locomotion Level;Lift;Stand;Transfers    Examination-Participation Restrictions  Church;Cleaning;Community Activity;Laundry;Volunteer;Shop;Meal Prep;Yard Work    Merchant navy officer  Evolving/Moderate complexity    Rehab Potential  Good    PT Frequency  2x / week    PT Duration  12 weeks    PT Treatment/Interventions  ADLs/Self Care Home Management;Aquatic Therapy;Biofeedback;Cryotherapy;Electrical Stimulation;Iontophoresis 4mg /ml Dexamethasone;Traction;Moist Heat;Ultrasound;DME Instruction;Gait training;Stair training;Functional mobility training;Therapeutic activities;Therapeutic exercise;Balance training;Neuromuscular re-education;Patient/family education;Orthotic Fit/Training;Wheelchair mobility training;Manual techniques;Compression bandaging;Passive range of motion;Dry needling;Energy conservation;Taping;Splinting;Vestibular    PT Next Visit Plan  step negotiation    PT Home Exercise Plan  maintain current HEP from [place] and Agree with Plan of Care  Patient       Patient will benefit from skilled therapeutic intervention in order to improve the following deficits and impairments:  Abnormal gait, Decreased activity tolerance, Decreased balance, Decreased coordination, Decreased endurance, Decreased mobility, Decreased range of motion, Decreased strength, Difficulty walking, Impaired perceived functional ability, Impaired sensation, Impaired tone, Impaired UE functional use, Improper body mechanics, Postural dysfunction, Cardiopulmonary status limiting activity, Impaired flexibility, Increased  muscle spasms  Visit Diagnosis: Multiple sclerosis exacerbation (HCC)  Muscle weakness (generalized)  Other abnormalities of gait and mobility     Problem List Patient Active Problem List   Diagnosis Date Noted   Hypoalbuminemia due to protein-calorie malnutrition (HCC)    Neurogenic bowel    Neurogenic bladder    Labile blood pressure    Neuropathic pain    Abscess of female pelvis    SVT (supraventricular tachycardia) (HCC)    Radial styloid tenosynovitis 03/12/2018   Wheelchair confinement 02/27/2018   Localized osteoporosis with current pathological fracture with routine healing 01/19/2017   Wrist fracture 01/16/2017   Sprain of ankle 03/23/2016   Closed fracture of lateral malleolus 03/16/2016   Health care maintenance 01/24/2016   Blood pressure elevated without history of HTN 10/25/2015   Essential hypertension 10/25/2015   Multiple sclerosis ([place]) 10/02/2015   Chronic left shoulder pain 07/19/2015   Multiple sclerosis exacerbation ([place]) 07/14/2015   MS (multiple sclerosis) ([place]) 11/26/2014   Increased body mass index 11/26/2014   HPV test positive 11/26/2014   Status post laparoscopic supracervical hysterectomy 11/26/2014   Galactorrhea 11/26/2014   Back ache 05/21/2014   Adiposity 05/21/2014   Disordered sleep 05/21/2014   Muscle spasticity 05/21/2014   Spasticity 05/21/2014   Calculus of kidney 17/49/4496   Renal colic 75/91/6384   Hypercholesteremia 08/19/2013   Hereditary and idiopathic neuropathy 08/19/2013   Hypercholesterolemia without hypertriglyceridemia 08/19/2013   Bladder infection, chronic 07/25/2012   Disorder of bladder function 07/25/2012   Incomplete bladder emptying 07/25/2012   Microscopic hematuria 07/25/2012   Right upper quadrant pain 07/25/2012    Pincus Badder 10/09/2019, 1:42 PM Merdis Delay, PT, DPT Physical Therapist - [facility]  Outpatient  Physical Therapy- [place] [address] [place], [place], [address] Phone: [phone]   Fax:  [phone]  Name: Lisa Williams MRN: [id number] Date of Birth: 1961-03-06

## 2019-10-14 ENCOUNTER — Ambulatory Visit: Payer: PPO

## 2019-10-14 ENCOUNTER — Other Ambulatory Visit: Payer: Self-pay

## 2019-10-14 DIAGNOSIS — G35 Multiple sclerosis: Secondary | ICD-10-CM

## 2019-10-14 DIAGNOSIS — R2681 Unsteadiness on feet: Secondary | ICD-10-CM

## 2019-10-14 DIAGNOSIS — R2689 Other abnormalities of gait and mobility: Secondary | ICD-10-CM

## 2019-10-14 DIAGNOSIS — M6281 Muscle weakness (generalized): Secondary | ICD-10-CM

## 2019-10-14 NOTE — Therapy (Signed)
[place] [facility] [place] [facility] MAIN [facility] SERVICES [address] [place], [place], [address] Phone: [phone]   Fax:  [phone]  Physical Therapy Treatment  Patient Details  Name: Lisa Williams MRN: [id number] Date of Birth: [birth year] Referring Provider (PT): Theora Master, MD   Encounter Date: 10/14/2019  PT End of Session - 10/14/19 1215    Visit Number  6    Number of Visits  24    Date for PT Re-Evaluation  12/16/19    Authorization Type  6/10 eval 5/18    PT Start Time  1015    PT Stop Time  1056    PT Time Calculation (min)  41 min    Equipment Utilized During Treatment  Gait belt;Other (comment)   bilateral AFOs   Activity Tolerance  Patient tolerated treatment well    Behavior During Therapy  WFL for tasks assessed/performed       Past Medical History:  Diagnosis Date  . Abdominal pain, right upper quadrant   . Back pain   . Calculus of kidney 12/09/2013  . Chronic back pain    unspecified  . Chronic left shoulder pain 07/19/2015  . Functional disorder of bladder    other  . Galactorrhea 11/26/2014   Chronic   . Hereditary and idiopathic neuropathy 08/19/2013  . HPV test positive   . Hypercholesteremia 08/19/2013  . Incomplete bladder emptying   . Microscopic hematuria   . MS (multiple sclerosis) (HCC)   . Muscle spasticity 05/21/2014  . Nonspecific findings on examination of urine    other  . Osteopenia   . Status post laparoscopic supracervical hysterectomy 11/26/2014  . Tobacco user 11/26/2014  . Wrist fracture     Past Surgical History:  Procedure Laterality Date  . bilateral tubal ligation  1996  . BREAST CYST EXCISION Left 2002  . KNEE SURGERY     right  . LAPAROSCOPIC SUPRACERVICAL HYSTERECTOMY  08/05/2013  . ORIF WRIST FRACTURE Left 01/17/2017   Procedure: OPEN REDUCTION INTERNAL FIXATION (ORIF) WRIST FRACTURE;  Surgeon: Lyndle Herrlich, MD;  Location: [facility] ORS;  Service: Orthopedics;  Laterality: Left;  . TUBAL  LIGATION Bilateral   . VAGINAL HYSTERECTOMY  03/2006    There were no vitals filed for this visit.  Subjective Assessment - 10/14/19 1214    Subjective  Patient reports feeling a little overheated today. No falls or LOB since last session. Has recieved her new recliner chair at home and hasn't had any spasms since then.    Pertinent History  Patient is previous patient of this therapist, was admitted to the hospital on 08/03/19 for MS exacerbation and then went to inpatient rehab on 08/08/19. Patient then received home health therapy and now is returning to outpatient PT. PMh includes HTN, SVT, neurogenic bowel, MS (1995), neuropathy, hx of wrist fx, HPV, Hypercholesteremia, adiposity, osteopenia, spasticity. She wears bilateral AFOs and uses a RW and/or wheelchair for mobility. She drives an adapted car.    Limitations  Lifting;Standing;Walking;House hold activities    How long can you sit comfortably?  n/a    How long can you stand comfortably?  4 minutes    How long can you walk comfortably?  walked 50 ft in inpatient rehab    Patient Stated Goals  return to PLOF, increase walking, get into shower, increase mobility    Currently in Pain?  No/denies          Treatment Today:    Standing in // bars:  2 trials: weight shifts L, R with focus on hip shift rather than pulling with UE's 10x each side  hip extension 8x each LE, Seated rest break between each LE,  Static stand 4x 10 second holds no UE support, very challenging to patient Ambulate length of // bars 2x, cues for upright posture, weight shift and step length.    Seated: Seated upright reach for ball and throw at target for pertubation 20x  Y reach and raise 10x  Hamstring stretch on PT LE for overpressure 30 second holds x 2 trials each LE  -hip flexion into PT hands 12x 3 second holds each LE, single LE at a time -hip abduction into PT hands 12x 3 second holds  -hip adduction into PT hands 12x 3 second holds        Seated  rest breaks and use of ice pack utilized to keep body temperature at functional range to reduce MS exacerbation.  Patient does demonstrate excellent motivation throughout session despite frequent overheating requiring rest breaks and use of ice to cool internal body temperature. Patient fatigued quickly and demonstrated limited capacity for functional mobility due to fatigue. Decreased muscle activation against PT resistance noted due to fatigue/pain. The patient would benefit from further skilled PT intervention to maximize QOL, safety, and functional mobility                      PT Education - 10/14/19 1215    Education provided  Yes    Education Details  exercise technique, body mechanics    Person(s) Educated  Patient    Methods  Explanation;Demonstration;Tactile cues;Verbal cues    Comprehension  Verbalized understanding;Returned demonstration;Verbal cues required;Tactile cues required       PT Short Term Goals - 09/24/19 1241      PT SHORT TERM GOAL #1   Title  Patient will be independent in home exercise program to improve strength/mobility for better functional independence with ADLs.    Baseline  5/19: HEP next session    Time  4    Period  Weeks    Status  New    Target Date  10/22/19      PT SHORT TERM GOAL #2   Title  Patient will perform a sit to stand with SUE support for increased ind. with transfers.    Baseline  5/19: heavy BUE support    Time  4    Period  Weeks    Status  New    Target Date  10/22/19        PT Long Term Goals - 09/24/19 0001      PT LONG TERM GOAL #1   Title  Patient will increase FOTO score to equal to or greater than  53/100   to demonstrate statistically significant improvement in mobility and quality of life.     Baseline  5/18: 48/100    Time  12    Period  Weeks    Status  New    Target Date  12/16/19      PT LONG TERM GOAL #2   Title  Patient (< 96 years old) will complete five times sit to stand test in < 20  seconds indicating an increased LE strength and improved balance.    Baseline  5/19: 42 seconds     Time  12    Period  Weeks    Status  New    Target Date  12/16/19  PT LONG TERM GOAL #3   Title  Patient will increase 10 meter walk test to <20 seconds as to improve gait speed for better community ambulation and to reduce fall risk.    Baseline  5/19: 42 seconds    Time  12    Period  Weeks    Status  New    Target Date  12/16/19      PT LONG TERM GOAL #4   Title  Patient will reduce timed up and go to <11 seconds to reduce fall risk and demonstrate improved transfer/gait ability.    Baseline  5/19: 47 seconds    Time  12    Period  Weeks    Status  New    Target Date  12/16/19            Plan - 10/14/19 1224    Clinical Impression Statement  Patient does demonstrate excellent motivation throughout session despite frequent overheating requiring rest breaks and use of ice to cool internal body temperature. Patient fatigued quickly and demonstrated limited capacity for functional mobility due to fatigue. Decreased muscle activation against PT resistance noted due to fatigue/pain. The patient would benefit from further skilled PT intervention to maximize QOL, safety, and functional mobility    Personal Factors and Comorbidities  Age;Comorbidity 3+;Fitness;Past/Current Experience;Sex;Time since onset of injury/illness/exacerbation    Comorbidities  HTN, SVT, neurogenic bowel, MS (1995), neuropathy, hx of wrist fx, HPV, Hypercholesteremia, adiposity, osteopenia, spasticity.    Examination-Activity Limitations  Bathing;Bed Mobility;Bend;Carry;Continence;Dressing;Hygiene/Grooming;Stairs;Squat;Reach Overhead;Locomotion Level;Lift;Stand;Transfers    Examination-Participation Restrictions  Church;Cleaning;Community Activity;Laundry;Volunteer;Shop;Meal Prep;Yard Work    [employer]chant [employer] officer  Evolving/Moderate complexity    Rehab Potential  Good    PT Frequency  2x / week     PT Duration  12 weeks    PT Treatment/Interventions  ADLs/Self Care Home Management;Aquatic Therapy;Biofeedback;Cryotherapy;Electrical Stimulation;Iontophoresis 4mg /ml Dexamethasone;Traction;Moist Heat;Ultrasound;DME Instruction;Gait training;Stair training;Functional mobility training;Therapeutic activities;Therapeutic exercise;Balance training;Neuromuscular re-education;Patient/family education;Orthotic Fit/Training;Wheelchair mobility training;Manual techniques;Compression bandaging;Passive range of motion;Dry needling;Energy conservation;Taping;Splinting;Vestibular    PT Next Visit Plan  step negotiation    PT Home Exercise Plan  maintain current HEP from [place] and Agree with Plan of Care  Patient       Patient will benefit from skilled therapeutic intervention in order to improve the following deficits and impairments:  Abnormal gait, Decreased activity tolerance, Decreased balance, Decreased coordination, Decreased endurance, Decreased mobility, Decreased range of motion, Decreased strength, Difficulty walking, Impaired perceived functional ability, Impaired sensation, Impaired tone, Impaired UE functional use, Improper body mechanics, Postural dysfunction, Cardiopulmonary status limiting activity, Impaired flexibility, Increased muscle spasms  Visit Diagnosis: Multiple sclerosis exacerbation (HCC)  Muscle weakness (generalized)  Other abnormalities of gait and mobility  Unsteadiness on feet     Problem List Patient Active Problem List   Diagnosis Date Noted  . Hypoalbuminemia due to protein-calorie malnutrition ([place])   . Neurogenic bowel   . Neurogenic bladder   . Labile blood pressure   . Neuropathic pain   . Abscess of female pelvis   . SVT (supraventricular tachycardia) ([place])   . Radial styloid tenosynovitis 03/12/2018  . Wheelchair confinement 02/27/2018  . Localized osteoporosis with current pathological fracture with routine healing 01/19/2017  . Wrist  fracture 01/16/2017  . Sprain of ankle 03/23/2016  . Closed fracture of lateral malleolus 03/16/2016  . Health care maintenance 01/24/2016  . Blood pressure elevated without history of HTN 10/25/2015  . Essential hypertension 10/25/2015  . Multiple sclerosis ([place]) 10/02/2015  . Chronic left  shoulder pain 07/19/2015  . Multiple sclerosis exacerbation (HCC) 07/14/2015  . MS (multiple sclerosis) (HCC) 11/26/2014  . Increased body mass index 11/26/2014  . HPV test positive 11/26/2014  . Status post laparoscopic supracervical hysterectomy 11/26/2014  . Galactorrhea 11/26/2014  . Back ache 05/21/2014  . Adiposity 05/21/2014  . Disordered sleep 05/21/2014  . Muscle spasticity 05/21/2014  . Spasticity 05/21/2014  . Calculus of kidney 12/09/2013  . Renal colic 12/09/2013  . Hypercholesteremia 08/19/2013  . Hereditary and idiopathic neuropathy 08/19/2013  . Hypercholesterolemia without hypertriglyceridemia 08/19/2013  . Bladder infection, chronic 07/25/2012  . Disorder of bladder function 07/25/2012  . Incomplete bladder emptying 07/25/2012  . Microscopic hematuria 07/25/2012  . Right upper quadrant pain 07/25/2012   Precious Bard, PT, DPT    10/14/2019, 12:25 PM  [place] [facility] MAIN [facility] SERVICES [address] [place], [place], [address] Phone: [phone]   Fax:  [phone]  Name: Lisa Williams MRN: [id number] Date of Birth: [birth year]

## 2019-10-16 ENCOUNTER — Ambulatory Visit: Payer: PPO

## 2019-10-17 ENCOUNTER — Other Ambulatory Visit: Payer: Self-pay

## 2019-10-17 ENCOUNTER — Ambulatory Visit: Payer: PPO

## 2019-10-17 DIAGNOSIS — R2681 Unsteadiness on feet: Secondary | ICD-10-CM

## 2019-10-17 DIAGNOSIS — G35 Multiple sclerosis: Secondary | ICD-10-CM | POA: Diagnosis not present

## 2019-10-17 DIAGNOSIS — M6281 Muscle weakness (generalized): Secondary | ICD-10-CM

## 2019-10-17 DIAGNOSIS — R2689 Other abnormalities of gait and mobility: Secondary | ICD-10-CM

## 2019-10-17 NOTE — Therapy (Signed)
[facility] MAIN [facility] SERVICES [address] [place], [place], [address] Phone: [phone]   Fax:  [phone]  Physical Therapy Treatment  Patient Details  Name: Lisa Williams MRN: [id number] Date of Birth: 01/01/1961 Referring Provider (PT): Theora Master, MD   Encounter Date: 10/17/2019   PT End of Session - 10/17/19 1110    Visit Number 7    Number of Visits 24    Date for PT Re-Evaluation 12/16/19    Authorization Type 7/10 eval 5/18    PT Start Time 0958    PT Stop Time 1042    PT Time Calculation (min) 44 min    Equipment Utilized During Treatment Gait belt;Other (comment)   bilateral AFOs   Activity Tolerance Patient tolerated treatment well    Behavior During Therapy WFL for tasks assessed/performed           Past Medical History:  Diagnosis Date  . Abdominal pain, right upper quadrant   . Back pain   . Calculus of kidney 12/09/2013  . Chronic back pain    unspecified  . Chronic left shoulder pain 07/19/2015  . Functional disorder of bladder    other  . Galactorrhea 11/26/2014   Chronic   . Hereditary and idiopathic neuropathy 08/19/2013  . HPV test positive   . Hypercholesteremia 08/19/2013  . Incomplete bladder emptying   . Microscopic hematuria   . MS (multiple sclerosis) (HCC)   . Muscle spasticity 05/21/2014  . Nonspecific findings on examination of urine    other  . Osteopenia   . Status post laparoscopic supracervical hysterectomy 11/26/2014  . Tobacco user 11/26/2014  . Wrist fracture     Past Surgical History:  Procedure Laterality Date  . bilateral tubal ligation  1996  . BREAST CYST EXCISION Left 2002  . KNEE SURGERY     right  . LAPAROSCOPIC SUPRACERVICAL HYSTERECTOMY  08/05/2013  . ORIF WRIST FRACTURE Left 01/17/2017   Procedure: OPEN REDUCTION INTERNAL FIXATION (ORIF) WRIST FRACTURE;  Surgeon: Lyndle Herrlich, MD;  Location: [facility] ORS;  Service: Orthopedics;  Laterality: Left;  . TUBAL  LIGATION Bilateral   . VAGINAL HYSTERECTOMY  03/2006    There were no vitals filed for this visit.   Subjective Assessment - 10/17/19 1108    Subjective Patient reports she has had no falls or LOB since last session. Has been compliant with HEP.    Pertinent History Patient is previous patient of this therapist, was admitted to the hospital on 08/03/19 for MS exacerbation and then went to inpatient rehab on 08/08/19. Patient then received home health therapy and now is returning to outpatient PT. PMh includes HTN, SVT, neurogenic bowel, MS (1995), neuropathy, hx of wrist fx, HPV, Hypercholesteremia, adiposity, osteopenia, spasticity. She wears bilateral AFOs and uses a RW and/or wheelchair for mobility. She drives an adapted car.    Limitations Lifting;Standing;Walking;House hold activities    How long can you sit comfortably? n/a    How long can you stand comfortably? 4 minutes    How long can you walk comfortably? walked 50 ft in inpatient rehab    Patient Stated Goals return to PLOF, increase walking, get into shower, increase mobility    Currently in Pain? No/denies             Treatment:  Sit to stand from w/c with RW. Weight shifts/march 12x each LE  Ambulate with RW and w/c follow with close CGA. Cues for shift and lift; terminated early  due to floors being cleaned and approaching wet surface.  Sit to stand, ambulate 6 ft to cone, turn around cone and return to chair to sit down. ; challenging to turn to the left, cues for keeping feet inside RW  Sit to stand, ambulate around cone, then figure 8 around additional cone 4 ft away from first and then return to chair, close CGA, very fatiguing with increased drag with second turn.   Seated: Upright posture, lean forward reach for ball, throw into hoop for pertubation's, coordination, and trunk stability x 5 minutes  Upright posture without back support, reaching with coordination for hand clap bilateral hands, x 4 minutes, very  challenging initially to maintain sequence with upright posture and pace but improved with familiarity to cue.   isometric contraction against PT resistance: -hip flexion 10x 3 second hold -hip abduction 10x 3 second hold -hip adduction 10x 3 second hold  Mod A for SLR abd/add 8x each LE  Trunk extension with bilateral UE's in IR for full pectoral stretch 12x   Patient utilizes ice pack for reduction of internal temperature to reduce risk of MS exacerbation and allow continuation of progression of interventions.                          PT Education - 10/17/19 1109    Education provided Yes    Education Details exercise technique, body mechanics    Person(s) Educated Patient    Methods Explanation;Demonstration;Tactile cues;Verbal cues    Comprehension Verbalized understanding;Returned demonstration;Verbal cues required;Tactile cues required            PT Short Term Goals - 09/24/19 1241      PT SHORT TERM GOAL #1   Title Patient will be independent in home exercise program to improve strength/mobility for better functional independence with ADLs.    Baseline 5/19: HEP next session    Time 4    Period Weeks    Status New    Target Date 10/22/19      PT SHORT TERM GOAL #2   Title Patient will perform a sit to stand with SUE support for increased ind. with transfers.    Baseline 5/19: heavy BUE support    Time 4    Period Weeks    Status New    Target Date 10/22/19             PT Long Term Goals - 09/24/19 0001      PT LONG TERM GOAL #1   Title Patient will increase FOTO score to equal to or greater than  53/100   to demonstrate statistically significant improvement in mobility and quality of life.     Baseline 5/18: 48/100    Time 12    Period Weeks    Status New    Target Date 12/16/19      PT LONG TERM GOAL #2   Title Patient (< 65 years old) will complete five times sit to stand test in < 20 seconds indicating an increased LE strength  and improved balance.    Baseline 5/19: 42 seconds     Time 12    Period Weeks    Status New    Target Date 12/16/19      PT LONG TERM GOAL #3   Title Patient will increase 10 meter walk test to <20 seconds as to improve gait speed for better community ambulation and to reduce fall risk.    Baseline  5/19: 42 seconds    Time 12    Period Weeks    Status New    Target Date 12/16/19      PT LONG TERM GOAL #4   Title Patient will reduce timed up and go to <11 seconds to reduce fall risk and demonstrate improved transfer/gait ability.    Baseline 5/19: 47 seconds    Time 12    Period Weeks    Status New    Target Date 12/16/19                 Plan - 10/17/19 1116    Clinical Impression Statement Patient presents with excellent motivation. Progression of trunk stability interventions performed with patient tolerating them well. Standing negotiation with turning is challenging for patient and will be an area for continued focus. The patient would benefit from further skilled PT intervention to maximize QOL, safety, and functional mobility    Personal Factors and Comorbidities Age;Comorbidity 3+;Fitness;Past/Current Experience;Sex;Time since onset of injury/illness/exacerbation    Comorbidities HTN, SVT, neurogenic bowel, MS (1995), neuropathy, hx of wrist fx, HPV, Hypercholesteremia, adiposity, osteopenia, spasticity.    Examination-Activity Limitations Bathing;Bed Mobility;Bend;Carry;Continence;Dressing;Hygiene/Grooming;Stairs;Squat;Reach Overhead;Locomotion Level;Lift;Stand;Transfers    Examination-Participation Restrictions Church;Cleaning;Community Activity;Laundry;Volunteer;Shop;Meal Prep;Yard Work    [employer]chant [employer] officer Evolving/Moderate complexity    Rehab Potential Good    PT Frequency 2x / week    PT Duration 12 weeks    PT Treatment/Interventions ADLs/Self Care Home Management;Aquatic Therapy;Biofeedback;Cryotherapy;Electrical Stimulation;Iontophoresis  4mg /ml Dexamethasone;Traction;Moist Heat;Ultrasound;DME Instruction;Gait training;Stair training;Functional mobility training;Therapeutic activities;Therapeutic exercise;Balance training;Neuromuscular re-education;Patient/family education;Orthotic Fit/Training;Wheelchair mobility training;Manual techniques;Compression bandaging;Passive range of motion;Dry needling;Energy conservation;Taping;Splinting;Vestibular    PT Next Visit Plan step negotiation    PT Home Exercise Plan maintain current HEP from [place] and Agree with Plan of Care Patient           Patient will benefit from skilled therapeutic intervention in order to improve the following deficits and impairments:  Abnormal gait, Decreased activity tolerance, Decreased balance, Decreased coordination, Decreased endurance, Decreased mobility, Decreased range of motion, Decreased strength, Difficulty walking, Impaired perceived functional ability, Impaired sensation, Impaired tone, Impaired UE functional use, Improper body mechanics, Postural dysfunction, Cardiopulmonary status limiting activity, Impaired flexibility, Increased muscle spasms  Visit Diagnosis: Multiple sclerosis exacerbation (HCC)  Muscle weakness (generalized)  Other abnormalities of gait and mobility  Unsteadiness on feet     Problem List Patient Active Problem List   Diagnosis Date Noted  . Hypoalbuminemia due to protein-calorie malnutrition ([place])   . Neurogenic bowel   . Neurogenic bladder   . Labile blood pressure   . Neuropathic pain   . Abscess of female pelvis   . SVT (supraventricular tachycardia) ([place])   . Radial styloid tenosynovitis 03/12/2018  . Wheelchair confinement 02/27/2018  . Localized osteoporosis with current pathological fracture with routine healing 01/19/2017  . Wrist fracture 01/16/2017  . Sprain of ankle 03/23/2016  . Closed fracture of lateral malleolus 03/16/2016  . Health care maintenance 01/24/2016  . Blood pressure  elevated without history of HTN 10/25/2015  . Essential hypertension 10/25/2015  . Multiple sclerosis ([place]) 10/02/2015  . Chronic left shoulder pain 07/19/2015  . Multiple sclerosis exacerbation ([place]) 07/14/2015  . MS (multiple sclerosis) ([place]) 11/26/2014  . Increased body mass index 11/26/2014  . HPV test positive 11/26/2014  . Status post laparoscopic supracervical hysterectomy 11/26/2014  . Galactorrhea 11/26/2014  . Back ache 05/21/2014  . Adiposity 05/21/2014  . Disordered sleep 05/21/2014  . Muscle spasticity 05/21/2014  . Spasticity 05/21/2014  .  Calculus of kidney 12/09/2013  . Renal colic 12/09/2013  . Hypercholesteremia 08/19/2013  . Hereditary and idiopathic neuropathy 08/19/2013  . Hypercholesterolemia without hypertriglyceridemia 08/19/2013  . Bladder infection, chronic 07/25/2012  . Disorder of bladder function 07/25/2012  . Incomplete bladder emptying 07/25/2012  . Microscopic hematuria 07/25/2012  . Right upper quadrant pain 07/25/2012   Precious Bard, PT, DPT   10/17/2019, 11:17 AM  [place] [facility] MAIN [facility] SERVICES [address] [place], [place], [address] Phone: [phone]   Fax:  [phone]  Name: Janiyah Beery Kendle MRN: [id number] Date of Birth: 08-Mar-1961

## 2019-10-21 ENCOUNTER — Other Ambulatory Visit: Payer: Self-pay

## 2019-10-21 ENCOUNTER — Ambulatory Visit: Payer: PPO

## 2019-10-21 DIAGNOSIS — G35 Multiple sclerosis: Secondary | ICD-10-CM

## 2019-10-21 DIAGNOSIS — R2689 Other abnormalities of gait and mobility: Secondary | ICD-10-CM

## 2019-10-21 DIAGNOSIS — M6281 Muscle weakness (generalized): Secondary | ICD-10-CM

## 2019-10-21 NOTE — Therapy (Signed)
[place] MAIN [facility] SERVICES [address] [place], [place], [address] Phone: [phone]   Fax:  [phone]  Physical Therapy Treatment  Patient Details  Name: Lisa Williams MRN: [id number] Date of Birth: 1960-09-16 Referring Provider (PT): Gurney Maxin, MD   Encounter Date: 10/21/2019   PT End of Session - 10/21/19 1159    Visit Number 8    Number of Visits 24    Date for PT Re-Evaluation 12/16/19    Authorization Type 8/10 eval 5/18    PT Start Time 1015    PT Stop Time 1100    PT Time Calculation (min) 45 min    Equipment Utilized During Treatment Gait belt;Other (comment)   bilateral AFOs   Activity Tolerance Patient tolerated treatment well    Behavior During Therapy WFL for tasks assessed/performed           Past Medical History:  Diagnosis Date  . Abdominal pain, right upper quadrant   . Back pain   . Calculus of kidney 12/09/2013  . Chronic back pain    unspecified  . Chronic left shoulder pain 07/19/2015  . Functional disorder of bladder    other  . Galactorrhea 11/26/2014   Chronic   . Hereditary and idiopathic neuropathy 08/19/2013  . HPV test positive   . Hypercholesteremia 08/19/2013  . Incomplete bladder emptying   . Microscopic hematuria   . MS (multiple sclerosis) ([place])   . Muscle spasticity 05/21/2014  . Nonspecific findings on examination of urine    other  . Osteopenia   . Status post laparoscopic supracervical hysterectomy 11/26/2014  . Tobacco user 11/26/2014  . Wrist fracture     Past Surgical History:  Procedure Laterality Date  . bilateral tubal ligation  1996  . BREAST CYST EXCISION Left 2002  . KNEE SURGERY     right  . LAPAROSCOPIC SUPRACERVICAL HYSTERECTOMY  08/05/2013  . ORIF WRIST FRACTURE Left 01/17/2017   Procedure: OPEN REDUCTION INTERNAL FIXATION (ORIF) WRIST FRACTURE;  Surgeon: Lovell Sheehan, MD;  Location: [facility] ORS;  Service: Orthopedics;  Laterality: Left;  . TUBAL  LIGATION Bilateral   . VAGINAL HYSTERECTOMY  03/2006    There were no vitals filed for this visit.   Subjective Assessment - 10/21/19 1158    Subjective Pt states she has been practicing her HEP.  She feels like she has good energy for today's session.    Pertinent History Patient is previous patient of this therapist, was admitted to the hospital on 08/03/19 for MS exacerbation and then went to inpatient rehab on 08/08/19. Patient then received home health therapy and now is returning to outpatient PT. PMh includes HTN, SVT, neurogenic bowel, MS (1995), neuropathy, hx of wrist fx, HPV, Hypercholesteremia, adiposity, osteopenia, spasticity. She wears bilateral AFOs and uses a RW and/or wheelchair for mobility. She drives an adapted car.    Limitations Lifting;Standing;Walking;House hold activities    How long can you sit comfortably? n/a    How long can you stand comfortably? 4 minutes    How long can you walk comfortably? walked 50 ft in inpatient rehab    Patient Stated Goals return to PLOF, increase walking, get into shower, increase mobility            Treatment Today: Sit to stand from [facility] to parallel bars x2  Standing in // bars:   2 trials: weight shifts L, R with focus on hip shift rather than pulling with UE's 10x each side  hip extension 8x each LE, Seated rest break between each LE,  Static stand 4x 10 second holds no UE support, very challenging to patient Ambulate length of // bars 2x, cues for upright posture, weight shift and step length- pt had difficulty to .   Seated rows: 2x15 blue t-band Seated shoulder extension: 2x15 blue t-band  Shoulder ER b/l: 2x15 blue t-band  Ball toss: 2x10 straight forward, R and L (to promote trunk rot) 15x ea   isometric contraction against PT resistance: -hip flexion 10x 3 second hold -hip abduction 10x 3 second hold -hip adduction (with ball between knees)     PT Education - 10/21/19 1159    Education provided Yes    Education  Details exercise technique, body mechanics    Person(s) Educated Patient    Methods Explanation;Demonstration;Tactile cues    Comprehension Verbalized understanding;Returned demonstration;Verbal cues required            PT Short Term Goals - 09/24/19 1241      PT SHORT TERM GOAL #1   Title Patient will be independent in home exercise program to improve strength/mobility for better functional independence with ADLs.    Baseline 5/19: HEP next session    Time 4    Period Weeks    Status New    Target Date 10/22/19      PT SHORT TERM GOAL #2   Title Patient will perform a sit to stand with SUE support for increased ind. with transfers.    Baseline 5/19: heavy BUE support    Time 4    Period Weeks    Status New    Target Date 10/22/19             PT Long Term Goals - 09/24/19 0001      PT LONG TERM GOAL #1   Title Patient will increase FOTO score to equal to or greater than  53/100   to demonstrate statistically significant improvement in mobility and quality of life.     Baseline 5/18: 48/100    Time 12    Period Weeks    Status New    Target Date 12/16/19      PT LONG TERM GOAL #2   Title Patient (< 68 years old) will complete five times sit to stand test in < 20 seconds indicating an increased LE strength and improved balance.    Baseline 5/19: 42 seconds     Time 12    Period Weeks    Status New    Target Date 12/16/19      PT LONG TERM GOAL #3   Title Patient will increase 10 meter walk test to <20 seconds as to improve gait speed for better community ambulation and to reduce fall risk.    Baseline 5/19: 42 seconds    Time 12    Period Weeks    Status New    Target Date 12/16/19      PT LONG TERM GOAL #4   Title Patient will reduce timed up and go to <11 seconds to reduce fall risk and demonstrate improved transfer/gait ability.    Baseline 5/19: 47 seconds    Time 12    Period Weeks    Status New    Target Date 12/16/19                 Plan  - 10/21/19 1200    Clinical Impression Statement Pt was challenged appropriately with trunk stability exercises today.  She had more difficulty turning to the R than to the L today when changing directions.  She should cont to benefit from skilled PT to maximize QOIL, safety, and mobility.    Personal Factors and Comorbidities Age;Comorbidity 3+;Fitness;Past/Current Experience;Sex;Time since onset of injury/illness/exacerbation    Comorbidities HTN, SVT, neurogenic bowel, MS (1995), neuropathy, hx of wrist fx, HPV, Hypercholesteremia, adiposity, osteopenia, spasticity.    Examination-Activity Limitations Bathing;Bed Mobility;Bend;Carry;Continence;Dressing;Hygiene/Grooming;Stairs;Squat;Reach Overhead;Locomotion Level;Lift;Stand;Transfers    Examination-Participation Restrictions Church;Cleaning;Community Activity;Laundry;Volunteer;Shop;Meal Prep;Yard Work    Conservation officer, historic buildings Evolving/Moderate complexity    Rehab Potential Good    PT Frequency 2x / week    PT Duration 12 weeks    PT Treatment/Interventions ADLs/Self Care Home Management;Aquatic Therapy;Biofeedback;Cryotherapy;Electrical Stimulation;Iontophoresis 4mg /ml Dexamethasone;Traction;Moist Heat;Ultrasound;DME Instruction;Gait training;Stair training;Functional mobility training;Therapeutic activities;Therapeutic exercise;Balance training;Neuromuscular re-education;Patient/family education;Orthotic Fit/Training;Wheelchair mobility training;Manual techniques;Compression bandaging;Passive range of motion;Dry needling;Energy conservation;Taping;Splinting;Vestibular    PT Next Visit Plan step negotiation, turning directions to the L and R    PT Home Exercise Plan maintain current HEP from HHPT    Consulted and Agree with Plan of Care Patient           Patient will benefit from skilled therapeutic intervention in order to improve the following deficits and impairments:  Abnormal gait, Decreased activity tolerance, Decreased  balance, Decreased coordination, Decreased endurance, Decreased mobility, Decreased range of motion, Decreased strength, Difficulty walking, Impaired perceived functional ability, Impaired sensation, Impaired tone, Impaired UE functional use, Improper body mechanics, Postural dysfunction, Cardiopulmonary status limiting activity, Impaired flexibility, Increased muscle spasms  Visit Diagnosis: Multiple sclerosis exacerbation (HCC)  Muscle weakness (generalized)  Other abnormalities of gait and mobility     Problem List Patient Active Problem List   Diagnosis Date Noted  . Hypoalbuminemia due to protein-calorie malnutrition (HCC)   . Neurogenic bowel   . Neurogenic bladder   . Labile blood pressure   . Neuropathic pain   . Abscess of female pelvis   . SVT (supraventricular tachycardia) (HCC)   . Radial styloid tenosynovitis 03/12/2018  . Wheelchair confinement 02/27/2018  . Localized osteoporosis with current pathological fracture with routine healing 01/19/2017  . Wrist fracture 01/16/2017  . Sprain of ankle 03/23/2016  . Closed fracture of lateral malleolus 03/16/2016  . Health care maintenance 01/24/2016  . Blood pressure elevated without history of HTN 10/25/2015  . Essential hypertension 10/25/2015  . Multiple sclerosis (HCC) 10/02/2015  . Chronic left shoulder pain 07/19/2015  . Multiple sclerosis exacerbation (HCC) 07/14/2015  . MS (multiple sclerosis) (HCC) 11/26/2014  . Increased body mass index 11/26/2014  . HPV test positive 11/26/2014  . Status post laparoscopic supracervical hysterectomy 11/26/2014  . Galactorrhea 11/26/2014  . Back ache 05/21/2014  . Adiposity 05/21/2014  . Disordered sleep 05/21/2014  . Muscle spasticity 05/21/2014  . Spasticity 05/21/2014  . Calculus of kidney 12/09/2013  . Renal colic 12/09/2013  . Hypercholesteremia 08/19/2013  . Hereditary and idiopathic neuropathy 08/19/2013  . Hypercholesterolemia without hypertriglyceridemia  08/19/2013  . Bladder infection, chronic 07/25/2012  . Disorder of bladder function 07/25/2012  . Incomplete bladder emptying 07/25/2012  . Microscopic hematuria 07/25/2012  . Right upper quadrant pain 07/25/2012    07/27/2012 10/21/2019, 12:02 PM 10/23/2019, PT, DPT  [place] [facility] MAIN [facility] [address] [place], [place], [place] Phone: [phone]   Fax:  [phone]  Name: Lisa Williams MRN: [id number] Date of Birth: Jun 04, 1960

## 2019-10-23 ENCOUNTER — Ambulatory Visit: Payer: PPO

## 2019-10-23 ENCOUNTER — Other Ambulatory Visit: Payer: Self-pay

## 2019-10-23 DIAGNOSIS — M6281 Muscle weakness (generalized): Secondary | ICD-10-CM

## 2019-10-23 DIAGNOSIS — G35 Multiple sclerosis: Secondary | ICD-10-CM | POA: Diagnosis not present

## 2019-10-23 DIAGNOSIS — R2689 Other abnormalities of gait and mobility: Secondary | ICD-10-CM

## 2019-10-23 DIAGNOSIS — R2681 Unsteadiness on feet: Secondary | ICD-10-CM

## 2019-10-23 NOTE — Therapy (Signed)
[place] MAIN [facility] SERVICES [address] [place], [place], [address] Phone: [phone]   Fax:  [phone]  Physical Therapy Treatment  Patient Details  Name: Lisa Williams MRN: [id number] Date of Birth: December 21, 1960 Referring Provider (PT): Gurney Maxin, MD   Encounter Date: 10/23/2019   PT End of Session - 10/23/19 1110    Visit Number 9    Number of Visits 24    Date for PT Re-Evaluation 12/16/19    Authorization Type 9/10 eval 5/18    PT Start Time 1015    PT Stop Time 1059    PT Time Calculation (min) 44 min    Equipment Utilized During Treatment Gait belt;Other (comment)   bilateral AFOs   Activity Tolerance Patient tolerated treatment well    Behavior During Therapy WFL for tasks assessed/performed           Past Medical History:  Diagnosis Date  . Abdominal pain, right upper quadrant   . Back pain   . Calculus of kidney 12/09/2013  . Chronic back pain    unspecified  . Chronic left shoulder pain 07/19/2015  . Functional disorder of bladder    other  . Galactorrhea 11/26/2014   Chronic   . Hereditary and idiopathic neuropathy 08/19/2013  . HPV test positive   . Hypercholesteremia 08/19/2013  . Incomplete bladder emptying   . Microscopic hematuria   . MS (multiple sclerosis) ([place])   . Muscle spasticity 05/21/2014  . Nonspecific findings on examination of urine    other  . Osteopenia   . Status post laparoscopic supracervical hysterectomy 11/26/2014  . Tobacco user 11/26/2014  . Wrist fracture     Past Surgical History:  Procedure Laterality Date  . bilateral tubal ligation  1996  . BREAST CYST EXCISION Left 2002  . KNEE SURGERY     right  . LAPAROSCOPIC SUPRACERVICAL HYSTERECTOMY  08/05/2013  . ORIF WRIST FRACTURE Left 01/17/2017   Procedure: OPEN REDUCTION INTERNAL FIXATION (ORIF) WRIST FRACTURE;  Surgeon: Lovell Sheehan, MD;  Location: [facility] ORS;  Service: Orthopedics;  Laterality: Left;  . TUBAL  LIGATION Bilateral   . VAGINAL HYSTERECTOMY  03/2006    There were no vitals filed for this visit.   Subjective Assessment - 10/23/19 1109    Subjective Patient reports no falls or LOB since last session. Has been compliant with HEP.    Pertinent History Patient is previous patient of this therapist, was admitted to the hospital on 08/03/19 for MS exacerbation and then went to inpatient rehab on 08/08/19. Patient then received home health therapy and now is returning to outpatient PT. PMh includes HTN, SVT, neurogenic bowel, MS (1995), neuropathy, hx of wrist fx, HPV, Hypercholesteremia, adiposity, osteopenia, spasticity. She wears bilateral AFOs and uses a RW and/or wheelchair for mobility. She drives an adapted car.    Limitations Lifting;Standing;Walking;House hold activities    How long can you sit comfortably? n/a    How long can you stand comfortably? 4 minutes    How long can you walk comfortably? walked 50 ft in inpatient rehab    Patient Stated Goals return to PLOF, increase walking, get into shower, increase mobility    Currently in Pain? No/denies                  Sit to stand from w/c with RW. Weight shifts/march 12x each LE   Ambulate with RW and w/c follow with close CGA. Cues for shift and lift; 60  ft first trial, seated rest break of 3 minutes, then additional 40 ft for 100 ft total.     Seated: Upright posture, lean forward reach inside/outside BOS for balloon taps for coordination, sequencing, timing of muscle recruitment .   BTB paloff press 15x each side; challenging.   isometric contraction against PT resistance: -hip flexion 10x 3 second hold -hip abduction 10x 3 second hold -hip adduction 10x 3 second hold      Patient utilizes ice pack for reduction of internal temperature to reduce risk of MS exacerbation and allow continuation of progression of interventions.     Patient is progressing with functional ambulation performing a duration of 100 ft with  one seated rest break. She is improving with functional capacity for prolonged ambulation tolerating the increased duration well. The patient would benefit from further skilled PT intervention to maximize QOL, safety, and functional mobility                PT Education - 10/23/19 1109    Education provided Yes    Education Details exercise technique, ambulation, body mechanics    Person(s) Educated Patient    Methods Demonstration;Explanation;Tactile cues;Verbal cues    Comprehension Verbalized understanding;Returned demonstration;Verbal cues required;Tactile cues required            PT Short Term Goals - 09/24/19 1241      PT SHORT TERM GOAL #1   Title Patient will be independent in home exercise program to improve strength/mobility for better functional independence with ADLs.    Baseline 5/19: HEP next session    Time 4    Period Weeks    Status New    Target Date 10/22/19      PT SHORT TERM GOAL #2   Title Patient will perform a sit to stand with SUE support for increased ind. with transfers.    Baseline 5/19: heavy BUE support    Time 4    Period Weeks    Status New    Target Date 10/22/19             PT Long Term Goals - 09/24/19 0001      PT LONG TERM GOAL #1   Title Patient will increase FOTO score to equal to or greater than  53/100   to demonstrate statistically significant improvement in mobility and quality of life.     Baseline 5/18: 48/100    Time 12    Period Weeks    Status New    Target Date 12/16/19      PT LONG TERM GOAL #2   Title Patient (< 6 years old) will complete five times sit to stand test in < 20 seconds indicating an increased LE strength and improved balance.    Baseline 5/19: 42 seconds     Time 12    Period Weeks    Status New    Target Date 12/16/19      PT LONG TERM GOAL #3   Title Patient will increase 10 meter walk test to <20 seconds as to improve gait speed for better community ambulation and to reduce fall  risk.    Baseline 5/19: 42 seconds    Time 12    Period Weeks    Status New    Target Date 12/16/19      PT LONG TERM GOAL #4   Title Patient will reduce timed up and go to <11 seconds to reduce fall risk and demonstrate improved transfer/gait ability.  Baseline 5/19: 47 seconds    Time 12    Period Weeks    Status New    Target Date 12/16/19                 Plan - 10/23/19 1111    Clinical Impression Statement Patient is progressing with functional ambulation performing a duration of 100 ft with one seated rest break. She is improving with functional capacity for prolonged ambulation tolerating the increased duration well. The patient would benefit from further skilled PT intervention to maximize QOL, safety, and functional mobility    Personal Factors and Comorbidities Age;Comorbidity 3+;Fitness;Past/Current Experience;Sex;Time since onset of injury/illness/exacerbation    Comorbidities HTN, SVT, neurogenic bowel, MS (1995), neuropathy, hx of wrist fx, HPV, Hypercholesteremia, adiposity, osteopenia, spasticity.    Examination-Activity Limitations Bathing;Bed Mobility;Bend;Carry;Continence;Dressing;Hygiene/Grooming;Stairs;Squat;Reach Overhead;Locomotion Level;Lift;Stand;Transfers    Examination-Participation Restrictions Church;Cleaning;Community Activity;Laundry;Volunteer;Shop;Meal Prep;Yard Work    Conservation officer, historic buildings Evolving/Moderate complexity    Rehab Potential Good    PT Frequency 2x / week    PT Duration 12 weeks    PT Treatment/Interventions ADLs/Self Care Home Management;Aquatic Therapy;Biofeedback;Cryotherapy;Electrical Stimulation;Iontophoresis 4mg /ml Dexamethasone;Traction;Moist Heat;Ultrasound;DME Instruction;Gait training;Stair training;Functional mobility training;Therapeutic activities;Therapeutic exercise;Balance training;Neuromuscular re-education;Patient/family education;Orthotic Fit/Training;Wheelchair mobility training;Manual  techniques;Compression bandaging;Passive range of motion;Dry needling;Energy conservation;Taping;Splinting;Vestibular    PT Next Visit Plan step negotiation, turning directions to the L and R    PT Home Exercise Plan maintain current HEP from HHPT    Consulted and Agree with Plan of Care Patient           Patient will benefit from skilled therapeutic intervention in order to improve the following deficits and impairments:  Abnormal gait, Decreased activity tolerance, Decreased balance, Decreased coordination, Decreased endurance, Decreased mobility, Decreased range of motion, Decreased strength, Difficulty walking, Impaired perceived functional ability, Impaired sensation, Impaired tone, Impaired UE functional use, Improper body mechanics, Postural dysfunction, Cardiopulmonary status limiting activity, Impaired flexibility, Increased muscle spasms  Visit Diagnosis: Multiple sclerosis exacerbation (HCC)  Muscle weakness (generalized)  Other abnormalities of gait and mobility  Unsteadiness on feet     Problem List Patient Active Problem List   Diagnosis Date Noted  . Hypoalbuminemia due to protein-calorie malnutrition (HCC)   . Neurogenic bowel   . Neurogenic bladder   . Labile blood pressure   . Neuropathic pain   . Abscess of female pelvis   . SVT (supraventricular tachycardia) (HCC)   . Radial styloid tenosynovitis 03/12/2018  . Wheelchair confinement 02/27/2018  . Localized osteoporosis with current pathological fracture with routine healing 01/19/2017  . Wrist fracture 01/16/2017  . Sprain of ankle 03/23/2016  . Closed fracture of lateral malleolus 03/16/2016  . Health care maintenance 01/24/2016  . Blood pressure elevated without history of HTN 10/25/2015  . Essential hypertension 10/25/2015  . Multiple sclerosis (HCC) 10/02/2015  . Chronic left shoulder pain 07/19/2015  . Multiple sclerosis exacerbation (HCC) 07/14/2015  . MS (multiple sclerosis) (HCC) 11/26/2014  .  Increased body mass index 11/26/2014  . HPV test positive 11/26/2014  . Status post laparoscopic supracervical hysterectomy 11/26/2014  . Galactorrhea 11/26/2014  . Back ache 05/21/2014  . Adiposity 05/21/2014  . Disordered sleep 05/21/2014  . Muscle spasticity 05/21/2014  . Spasticity 05/21/2014  . Calculus of kidney 12/09/2013  . Renal colic 12/09/2013  . Hypercholesteremia 08/19/2013  . Hereditary and idiopathic neuropathy 08/19/2013  . Hypercholesterolemia without hypertriglyceridemia 08/19/2013  . Bladder infection, chronic 07/25/2012  . Disorder of bladder function 07/25/2012  . Incomplete bladder emptying 07/25/2012  . Microscopic hematuria 07/25/2012  .  Right upper quadrant pain 07/25/2012   Precious Bard, PT, DPT   10/23/2019, 11:12 AM  [facility] MAIN [facility] SERVICES [address] [place], [place], [address] Phone: [phone]   Fax:  [phone]  Name: Zakariah Dejarnette Cajamarca MRN: [id number] Date of Birth: 04-15-1961

## 2019-10-28 ENCOUNTER — Other Ambulatory Visit: Payer: Self-pay

## 2019-10-28 ENCOUNTER — Ambulatory Visit: Payer: PPO

## 2019-10-28 DIAGNOSIS — R2681 Unsteadiness on feet: Secondary | ICD-10-CM

## 2019-10-28 DIAGNOSIS — G35 Multiple sclerosis: Secondary | ICD-10-CM

## 2019-10-28 DIAGNOSIS — M6281 Muscle weakness (generalized): Secondary | ICD-10-CM

## 2019-10-28 DIAGNOSIS — R2689 Other abnormalities of gait and mobility: Secondary | ICD-10-CM

## 2019-10-28 NOTE — Therapy (Signed)
[place] MAIN [facility] SERVICES [address] [place], [place], [address] Phone: [phone]   Fax:  [phone]  Physical Therapy Treatment Physical Therapy Progress Note   Dates of reporting period  09/24/19   to   10/28/19   Patient Details  Name: Netta Fodge Mimnaugh MRN: [id number] Date of Birth: 1960/06/13 Referring Provider (PT): Gurney Maxin, MD   Encounter Date: 10/28/2019   PT End of Session - 10/28/19 5809    Visit Number 10    Number of Visits 24    Date for PT Re-Evaluation 12/16/19    Authorization Type 10/10 eval 5/18; next session 1/10 PN 6/22    PT Start Time 1015    PT Stop Time 1101    PT Time Calculation (min) 46 min    Equipment Utilized During Treatment Gait belt;Other (comment)   bilateral AFOs   Activity Tolerance Patient tolerated treatment well    Behavior During Therapy WFL for tasks assessed/performed           Past Medical History:  Diagnosis Date  . Abdominal pain, right upper quadrant   . Back pain   . Calculus of kidney 12/09/2013  . Chronic back pain    unspecified  . Chronic left shoulder pain 07/19/2015  . Functional disorder of bladder    other  . Galactorrhea 11/26/2014   Chronic   . Hereditary and idiopathic neuropathy 08/19/2013  . HPV test positive   . Hypercholesteremia 08/19/2013  . Incomplete bladder emptying   . Microscopic hematuria   . MS (multiple sclerosis) ([place])   . Muscle spasticity 05/21/2014  . Nonspecific findings on examination of urine    other  . Osteopenia   . Status post laparoscopic supracervical hysterectomy 11/26/2014  . Tobacco user 11/26/2014  . Wrist fracture     Past Surgical History:  Procedure Laterality Date  . bilateral tubal ligation  1996  . BREAST CYST EXCISION Left 2002  . KNEE SURGERY     right  . LAPAROSCOPIC SUPRACERVICAL HYSTERECTOMY  08/05/2013  . ORIF WRIST FRACTURE Left 01/17/2017   Procedure: OPEN REDUCTION INTERNAL FIXATION (ORIF) WRIST  FRACTURE;  Surgeon: Lovell Sheehan, MD;  Location: [facility] ORS;  Service: Orthopedics;  Laterality: Left;  . TUBAL LIGATION Bilateral   . VAGINAL HYSTERECTOMY  03/2006    There were no vitals filed for this visit.   Subjective Assessment - 10/28/19 1202    Subjective Patient reports no falls or LOB since last session. Has been compliant with HEP; no falls since last session.    Pertinent History Patient is previous patient of this therapist, was admitted to the hospital on 08/03/19 for MS exacerbation and then went to inpatient rehab on 08/08/19. Patient then received home health therapy and now is returning to outpatient PT. PMh includes HTN, SVT, neurogenic bowel, MS (1995), neuropathy, hx of wrist fx, HPV, Hypercholesteremia, adiposity, osteopenia, spasticity. She wears bilateral AFOs and uses a RW and/or wheelchair for mobility. She drives an adapted car.    Limitations Lifting;Standing;Walking;House hold activities    How long can you sit comfortably? n/a    How long can you stand comfortably? 4 minutes    How long can you walk comfortably? walked 50 ft in inpatient rehab    Patient Stated Goals return to PLOF, increase walking, get into shower, increase mobility    Currently in Pain? No/denies            Goals:  FOTO 46.99  5xSTS:  40.86 seconds, unable to reach full stand last two attempts.  10 MWT: 36. 1 seconds with RW  TUG: 45. 4 seconds with RW    Treatment:  Seated boxing:  Cross body punches to mitts on PT hands with no back support for focused core stabilization with crossing midline 2x 60 seconds  Cross body punch with secondary combination of elbow for full cross body rotation to PT mitt for trunk stability, coordination, and cardiovascular challenge x 60 seconds Upper cut to mitts in PT hands for core activation without back support with perturbations 60 seconds  isometric contraction in seated against PT resistance: -hip flexion 10x 3 second hold -hip abduction 10x 3  second hold -hip adduction 10x 3 second hold  Seated hamstring stretch with LE propped onto therapists leg for optimal positioning 60 seconds each LE   Patient utilizes ice pack for reduction of internal temperature to reduce risk of MS exacerbation and allow continuation of progression of interventions.    Patient's condition has the potential to improve in response to therapy. Maximum improvement is yet to be obtained. The anticipated improvement is attainable and reasonable in a generally predictable time.  Patient reports she is improving with her strength and how well she is moving.     Patient is progressing towards functional goals, increasing her velocity of ambulation during her 10 MWT. Her sit to stand transfers require heavy BUE support and patient is more challenged due to lower height of her new wheelchair. Her FOTO dropped one point despite her improvement in answering the question of ambulating in a room and the rest of the answers staying the same. Patient's condition has the potential to improve in response to therapy. Maximum improvement is yet to be obtained. The anticipated improvement is attainable and reasonable in a generally predictable time.  The patient would benefit from further skilled PT intervention to maximize QOL, safety, and functional mobility            PT Education - 10/28/19 1203    Education provided Yes    Education Details goals, exercise technique    Person(s) Educated Patient    Methods Explanation;Demonstration;Tactile cues;Verbal cues    Comprehension Verbalized understanding;Returned demonstration;Tactile cues required;Verbal cues required            PT Short Term Goals - 10/28/19 1207      PT SHORT TERM GOAL #1   Title Patient will be independent in home exercise program to improve strength/mobility for better functional independence with ADLs.    Baseline 5/19: HEP next session 6/22: HEP compliant    Time 4    Period Weeks      Status Partially Met    Target Date 10/22/19      PT SHORT TERM GOAL #2   Title Patient will perform a sit to stand with SUE support for increased ind. with transfers.    Baseline 5/19: heavy BUE support 6/22: requires one hand on walker one hand on w/c    Time 4    Period Weeks    Status On-going    Target Date 10/22/19             PT Long Term Goals - 10/28/19 0001      PT LONG TERM GOAL #1   Title Patient will increase FOTO score to equal to or greater than  53/100   to demonstrate statistically significant improvement in mobility and quality of life.     Baseline 6/22: 46.99% 5/18: 48/100  Time 12    Period Weeks    Status On-going    Target Date 12/16/19      PT LONG TERM GOAL #2   Title Patient (< 1 years old) will complete five times sit to stand test in < 20 seconds indicating an increased LE strength and improved balance.    Baseline 6/22: 40. 86 sec; unable to reach full stand last two attempts 5/19: 42 seconds     Time 12    Period Weeks    Status Partially Met    Target Date 12/16/19      PT LONG TERM GOAL #3   Title Patient will increase 10 meter walk test to <20 seconds as to improve gait speed for better community ambulation and to reduce fall risk.    Baseline 6/22: 36.1 seconds 5/19: 42 seconds    Time 12    Period Weeks    Status Partially Met    Target Date 12/16/19      PT LONG TERM GOAL #4   Title Patient will reduce timed up and go to <11 seconds to reduce fall risk and demonstrate improved transfer/gait ability.    Baseline 6/22: 45.4 seconds 5/19: 47 seconds    Time 12    Period Weeks    Status Partially Met    Target Date 12/16/19                 Plan - 10/28/19 1207    Clinical Impression Statement Patient is progressing towards functional goals, increasing her velocity of ambulation during her 10 MWT. Her sit to stand transfers require heavy BUE support and patient is more challenged due to lower height of her new  wheelchair. Her FOTO dropped one point despite her improvement in answering the question of ambulating in a room and the rest of the answers staying the same. Patient's condition has the potential to improve in response to therapy. Maximum improvement is yet to be obtained. The anticipated improvement is attainable and reasonable in a generally predictable time.  The patient would benefit from further skilled PT intervention to maximize QOL, safety, and functional mobility    Personal Factors and Comorbidities Age;Comorbidity 3+;Fitness;Past/Current Experience;Sex;Time since onset of injury/illness/exacerbation    Comorbidities HTN, SVT, neurogenic bowel, MS (1995), neuropathy, hx of wrist fx, HPV, Hypercholesteremia, adiposity, osteopenia, spasticity.    Examination-Activity Limitations Bathing;Bed Mobility;Bend;Carry;Continence;Dressing;Hygiene/Grooming;Stairs;Squat;Reach Overhead;Locomotion Level;Lift;Stand;Transfers    Examination-Participation Restrictions Church;Cleaning;Community Activity;Laundry;Volunteer;Shop;Meal Prep;Yard Work    [employer]chant [employer] officer Evolving/Moderate complexity    Rehab Potential Good    PT Frequency 2x / week    PT Duration 12 weeks    PT Treatment/Interventions ADLs/Self Care Home Management;Aquatic Therapy;Biofeedback;Cryotherapy;Electrical Stimulation;Iontophoresis 54m/ml Dexamethasone;Traction;Moist Heat;Ultrasound;DME Instruction;Gait training;Stair training;Functional mobility training;Therapeutic activities;Therapeutic exercise;Balance training;Neuromuscular re-education;Patient/family education;Orthotic Fit/Training;Wheelchair mobility training;Manual techniques;Compression bandaging;Passive range of motion;Dry needling;Energy conservation;Taping;Splinting;Vestibular    PT Next Visit Plan step negotiation, turning directions to the L and R    PT Home Exercise Plan maintain current HEP from HHPT    Consulted and Agree with Plan of Care Patient            Patient will benefit from skilled therapeutic intervention in order to improve the following deficits and impairments:  Abnormal gait, Decreased activity tolerance, Decreased balance, Decreased coordination, Decreased endurance, Decreased mobility, Decreased range of motion, Decreased strength, Difficulty walking, Impaired perceived functional ability, Impaired sensation, Impaired tone, Impaired UE functional use, Improper body mechanics, Postural dysfunction, Cardiopulmonary status limiting activity, Impaired flexibility, Increased muscle spasms  Visit Diagnosis:  Multiple sclerosis exacerbation (HCC)  Muscle weakness (generalized)  Other abnormalities of gait and mobility  Unsteadiness on feet     Problem List Patient Active Problem List   Diagnosis Date Noted  . Hypoalbuminemia due to protein-calorie malnutrition ([place])   . Neurogenic bowel   . Neurogenic bladder   . Labile blood pressure   . Neuropathic pain   . Abscess of female pelvis   . SVT (supraventricular tachycardia) ([place])   . Radial styloid tenosynovitis 03/12/2018  . Wheelchair confinement 02/27/2018  . Localized osteoporosis with current pathological fracture with routine healing 01/19/2017  . Wrist fracture 01/16/2017  . Sprain of ankle 03/23/2016  . Closed fracture of lateral malleolus 03/16/2016  . Health care maintenance 01/24/2016  . Blood pressure elevated without history of HTN 10/25/2015  . Essential hypertension 10/25/2015  . Multiple sclerosis ([place]) 10/02/2015  . Chronic left shoulder pain 07/19/2015  . Multiple sclerosis exacerbation ([place]) 07/14/2015  . MS (multiple sclerosis) ([place]) 11/26/2014  . Increased body mass index 11/26/2014  . HPV test positive 11/26/2014  . Status post laparoscopic supracervical hysterectomy 11/26/2014  . Galactorrhea 11/26/2014  . Back ache 05/21/2014  . Adiposity 05/21/2014  . Disordered sleep 05/21/2014  . Muscle spasticity 05/21/2014  . Spasticity 05/21/2014  .  Calculus of kidney 12/09/2013  . Renal colic 80/32/1224  . Hypercholesteremia 08/19/2013  . Hereditary and idiopathic neuropathy 08/19/2013  . Hypercholesterolemia without hypertriglyceridemia 08/19/2013  . Bladder infection, chronic 07/25/2012  . Disorder of bladder function 07/25/2012  . Incomplete bladder emptying 07/25/2012  . Microscopic hematuria 07/25/2012  . Right upper quadrant pain 07/25/2012   Janna Arch, PT, DPT   10/28/2019, 12:22 PM  [place] MAIN [facility] SERVICES [address] [place], [place], [address] Phone: [phone]   Fax:  [phone]  Name: Jackolyn Geron Spees MRN: [id number] Date of Birth: 05-03-1961

## 2019-10-30 ENCOUNTER — Ambulatory Visit: Payer: PPO

## 2019-10-30 ENCOUNTER — Other Ambulatory Visit: Payer: Self-pay

## 2019-10-30 DIAGNOSIS — R2681 Unsteadiness on feet: Secondary | ICD-10-CM

## 2019-10-30 DIAGNOSIS — G35 Multiple sclerosis: Secondary | ICD-10-CM

## 2019-10-30 DIAGNOSIS — M6281 Muscle weakness (generalized): Secondary | ICD-10-CM

## 2019-10-30 DIAGNOSIS — R2689 Other abnormalities of gait and mobility: Secondary | ICD-10-CM

## 2019-10-30 NOTE — Therapy (Signed)
[place] MAIN [facility] SERVICES [address] [place], [place], [address] Phone: [phone]   Fax:  [phone]  Physical Therapy Treatment  Patient Details  Name: Lisa Williams MRN: [id number] Date of Birth: Aug 05, 1960 Referring Provider (PT): Gurney Maxin, MD   Encounter Date: 10/30/2019   PT End of Session - 10/30/19 1652    Visit Number 11    Number of Visits 24    Date for PT Re-Evaluation 12/16/19    Authorization Type 1/10 PN 6/22    PT Start Time 1017    PT Stop Time 1100    PT Time Calculation (min) 43 min    Equipment Utilized During Treatment Gait belt;Other (comment)   bilateral AFOs   Activity Tolerance Patient tolerated treatment well    Behavior During Therapy WFL for tasks assessed/performed           Past Medical History:  Diagnosis Date  . Abdominal pain, right upper quadrant   . Back pain   . Calculus of kidney 12/09/2013  . Chronic back pain    unspecified  . Chronic left shoulder pain 07/19/2015  . Functional disorder of bladder    other  . Galactorrhea 11/26/2014   Chronic   . Hereditary and idiopathic neuropathy 08/19/2013  . HPV test positive   . Hypercholesteremia 08/19/2013  . Incomplete bladder emptying   . Microscopic hematuria   . MS (multiple sclerosis) ([place])   . Muscle spasticity 05/21/2014  . Nonspecific findings on examination of urine    other  . Osteopenia   . Status post laparoscopic supracervical hysterectomy 11/26/2014  . Tobacco user 11/26/2014  . Wrist fracture     Past Surgical History:  Procedure Laterality Date  . bilateral tubal ligation  1996  . BREAST CYST EXCISION Left 2002  . KNEE SURGERY     right  . LAPAROSCOPIC SUPRACERVICAL HYSTERECTOMY  08/05/2013  . ORIF WRIST FRACTURE Left 01/17/2017   Procedure: OPEN REDUCTION INTERNAL FIXATION (ORIF) WRIST FRACTURE;  Surgeon: Lovell Sheehan, MD;  Location: [facility] ORS;  Service: Orthopedics;  Laterality: Left;  . TUBAL  LIGATION Bilateral   . VAGINAL HYSTERECTOMY  03/2006    There were no vitals filed for this visit.   Subjective Assessment - 10/30/19 1110    Subjective Patient reports feeling very energetic today. Has been compliant with HEP and reports no falls or LOB since last session.    Pertinent History Patient is previous patient of this therapist, was admitted to the hospital on 08/03/19 for MS exacerbation and then went to inpatient rehab on 08/08/19. Patient then received home health therapy and now is returning to outpatient PT. PMh includes HTN, SVT, neurogenic bowel, MS (1995), neuropathy, hx of wrist fx, HPV, Hypercholesteremia, adiposity, osteopenia, spasticity. She wears bilateral AFOs and uses a RW and/or wheelchair for mobility. She drives an adapted car.    Limitations Lifting;Standing;Walking;House hold activities    How long can you sit comfortably? n/a    How long can you stand comfortably? 4 minutes    How long can you walk comfortably? walked 50 ft in inpatient rehab    Patient Stated Goals return to PLOF, increase walking, get into shower, increase mobility    Currently in Pain? No/denies               Standing in // bars:   weight shifts L, R with focus on hip shift rather than pulling with UE's 10x each side Step over green  theraband x 8 trials each LE; seated rest break between.  Ambulate length of // bars 2x, cues for upright posture, weight shift and step length. Focus on foot clearance "stomp march"   standing abduction step laterally: very challenging LLE 6x each LE; seated rest break between  seated Balloon taps reaching inside/outside BOS for core stability and stabilization against pertubation's.   isometric contraction against PT resistance: -hip flexion 10x 3 second hold -hip abduction 10x 3 second hold -hip adduction 10x 3 second hold    Seated hamstring stretch with LE propped onto therapists leg for optimal positioning 60 seconds each LE   Patient  utilizes ice pack for reduction of internal temperature to reduce risk of MS exacerbation and allow continuation of progression of interventions.      Patient is progressing with tolerance of performing standing interventions. Reduced need for heavy reliance upon UE's for foot clearance this session resulting with improved trunk positioning with mobility. LLE continues to be more challenging to progress with partial weight shift only to RLE. The patient would benefit from further skilled PT intervention to maximize QOL, safety, and functional mobility               PT Education - 10/30/19 1649    Education provided Yes    Education Details exercise technique, body mechanics    Person(s) Educated Patient    Methods Explanation;Demonstration;Tactile cues;Verbal cues    Comprehension Verbalized understanding;Returned demonstration;Verbal cues required;Tactile cues required            PT Short Term Goals - 10/28/19 1207      PT SHORT TERM GOAL #1   Title Patient will be independent in home exercise program to improve strength/mobility for better functional independence with ADLs.    Baseline 5/19: HEP next session 6/22: HEP compliant    Time 4    Period Weeks    Status Partially Met    Target Date 10/22/19      PT SHORT TERM GOAL #2   Title Patient will perform a sit to stand with SUE support for increased ind. with transfers.    Baseline 5/19: heavy BUE support 6/22: requires one hand on walker one hand on w/c    Time 4    Period Weeks    Status On-going    Target Date 10/22/19             PT Long Term Goals - 10/28/19 0001      PT LONG TERM GOAL #1   Title Patient will increase FOTO score to equal to or greater than  53/100   to demonstrate statistically significant improvement in mobility and quality of life.     Baseline 6/22: 46.99% 5/18: 48/100    Time 12    Period Weeks    Status On-going    Target Date 12/16/19      PT LONG TERM GOAL #2   Title  Patient (< 92 years old) will complete five times sit to stand test in < 20 seconds indicating an increased LE strength and improved balance.    Baseline 6/22: 40. 86 sec; unable to reach full stand last two attempts 5/19: 42 seconds     Time 12    Period Weeks    Status Partially Met    Target Date 12/16/19      PT LONG TERM GOAL #3   Title Patient will increase 10 meter walk test to <20 seconds as to improve gait speed for better  community ambulation and to reduce fall risk.    Baseline 6/22: 36.1 seconds 5/19: 42 seconds    Time 12    Period Weeks    Status Partially Met    Target Date 12/16/19      PT LONG TERM GOAL #4   Title Patient will reduce timed up and go to <11 seconds to reduce fall risk and demonstrate improved transfer/gait ability.    Baseline 6/22: 45.4 seconds 5/19: 47 seconds    Time 12    Period Weeks    Status Partially Met    Target Date 12/16/19                 Plan - 10/30/19 1654    Clinical Impression Statement Patient is progressing with tolerance of performing standing interventions. Reduced need for heavy reliance upon UE's for foot clearance this session resulting with improved trunk positioning with mobility. LLE continues to be more challenging to progress with partial weight shift only to RLE. The patient would benefit from further skilled PT intervention to maximize QOL, safety, and functional mobility    Personal Factors and Comorbidities Age;Comorbidity 3+;Fitness;Past/Current Experience;Sex;Time since onset of injury/illness/exacerbation    Comorbidities HTN, SVT, neurogenic bowel, MS (1995), neuropathy, hx of wrist fx, HPV, Hypercholesteremia, adiposity, osteopenia, spasticity.    Examination-Activity Limitations Bathing;Bed Mobility;Bend;Carry;Continence;Dressing;Hygiene/Grooming;Stairs;Squat;Reach Overhead;Locomotion Level;Lift;Stand;Transfers    Examination-Participation Restrictions Church;Cleaning;Community  Activity;Laundry;Volunteer;Shop;Meal Prep;Yard Work    [employer]chant [employer] officer Evolving/Moderate complexity    Rehab Potential Good    PT Frequency 2x / week    PT Duration 12 weeks    PT Treatment/Interventions ADLs/Self Care Home Management;Aquatic Therapy;Biofeedback;Cryotherapy;Electrical Stimulation;Iontophoresis 43m/ml Dexamethasone;Traction;Moist Heat;Ultrasound;DME Instruction;Gait training;Stair training;Functional mobility training;Therapeutic activities;Therapeutic exercise;Balance training;Neuromuscular re-education;Patient/family education;Orthotic Fit/Training;Wheelchair mobility training;Manual techniques;Compression bandaging;Passive range of motion;Dry needling;Energy conservation;Taping;Splinting;Vestibular    PT Next Visit Plan step negotiation, turning directions to the L and R    PT Home Exercise Plan maintain current HEP from HHPT    Consulted and Agree with Plan of Care Patient           Patient will benefit from skilled therapeutic intervention in order to improve the following deficits and impairments:  Abnormal gait, Decreased activity tolerance, Decreased balance, Decreased coordination, Decreased endurance, Decreased mobility, Decreased range of motion, Decreased strength, Difficulty walking, Impaired perceived functional ability, Impaired sensation, Impaired tone, Impaired UE functional use, Improper body mechanics, Postural dysfunction, Cardiopulmonary status limiting activity, Impaired flexibility, Increased muscle spasms  Visit Diagnosis: Multiple sclerosis exacerbation (HCC)  Muscle weakness (generalized)  Other abnormalities of gait and mobility  Unsteadiness on feet     Problem List Patient Active Problem List   Diagnosis Date Noted  . Hypoalbuminemia due to protein-calorie malnutrition ([place]   . Neurogenic bowel   . Neurogenic bladder   . Labile blood pressure   . Neuropathic pain   . Abscess of female pelvis   . SVT  (supraventricular tachycardia) ([place]   . Radial styloid tenosynovitis 03/12/2018  . Wheelchair confinement 02/27/2018  . Localized osteoporosis with current pathological fracture with routine healing 01/19/2017  . Wrist fracture 01/16/2017  . Sprain of ankle 03/23/2016  . Closed fracture of lateral malleolus 03/16/2016  . Health care maintenance 01/24/2016  . Blood pressure elevated without history of HTN 10/25/2015  . Essential hypertension 10/25/2015  . Multiple sclerosis ([place] 10/02/2015  . Chronic left shoulder pain 07/19/2015  . Multiple sclerosis exacerbation ([place] 07/14/2015  . MS (multiple sclerosis) ([place] 11/26/2014  . Increased body mass index 11/26/2014  . HPV test  positive 11/26/2014  . Status post laparoscopic supracervical hysterectomy 11/26/2014  . Galactorrhea 11/26/2014  . Back ache 05/21/2014  . Adiposity 05/21/2014  . Disordered sleep 05/21/2014  . Muscle spasticity 05/21/2014  . Spasticity 05/21/2014  . Calculus of kidney 12/09/2013  . Renal colic 16/58/0063  . Hypercholesteremia 08/19/2013  . Hereditary and idiopathic neuropathy 08/19/2013  . Hypercholesterolemia without hypertriglyceridemia 08/19/2013  . Bladder infection, chronic 07/25/2012  . Disorder of bladder function 07/25/2012  . Incomplete bladder emptying 07/25/2012  . Microscopic hematuria 07/25/2012  . Right upper quadrant pain 07/25/2012   Janna Arch, PT, DPT   10/30/2019, 4:55 PM  [place] MAIN [facility] SERVICES [address] [place], [place], [address] Phone: [phone]   Fax:  [phone]  Name: Melenda Bielak Volkov MRN: [id number] Date of Birth: January 01, 1961

## 2019-11-04 ENCOUNTER — Ambulatory Visit: Payer: PPO

## 2019-11-04 ENCOUNTER — Other Ambulatory Visit: Payer: Self-pay

## 2019-11-04 DIAGNOSIS — G35 Multiple sclerosis: Secondary | ICD-10-CM | POA: Diagnosis not present

## 2019-11-04 DIAGNOSIS — R2689 Other abnormalities of gait and mobility: Secondary | ICD-10-CM

## 2019-11-04 DIAGNOSIS — M6281 Muscle weakness (generalized): Secondary | ICD-10-CM

## 2019-11-04 DIAGNOSIS — R2681 Unsteadiness on feet: Secondary | ICD-10-CM

## 2019-11-04 NOTE — Therapy (Signed)
[place] MAIN [facility] SERVICES [address] [place], [place], [address] Phone: [phone]   Fax:  [phone]  Physical Therapy Treatment  Patient Details  Name: Lisa Williams MRN: [id number] Date of Birth: 02-05-1961 Referring Provider (PT): Gurney Maxin, MD   Encounter Date: 11/04/2019   PT End of Session - 11/04/19 1736    Visit Number 12    Number of Visits 24    Date for PT Re-Evaluation 12/16/19    Authorization Type 2/10 PN 6/22    PT Start Time 1015    PT Stop Time 1059    PT Time Calculation (min) 44 min    Equipment Utilized During Treatment Gait belt;Other (comment)   bilateral AFOs   Activity Tolerance Patient tolerated treatment well    Behavior During Therapy WFL for tasks assessed/performed           Past Medical History:  Diagnosis Date  . Abdominal pain, right upper quadrant   . Back pain   . Calculus of kidney 12/09/2013  . Chronic back pain    unspecified  . Chronic left shoulder pain 07/19/2015  . Functional disorder of bladder    other  . Galactorrhea 11/26/2014   Chronic   . Hereditary and idiopathic neuropathy 08/19/2013  . HPV test positive   . Hypercholesteremia 08/19/2013  . Incomplete bladder emptying   . Microscopic hematuria   . MS (multiple sclerosis) ([place])   . Muscle spasticity 05/21/2014  . Nonspecific findings on examination of urine    other  . Osteopenia   . Status post laparoscopic supracervical hysterectomy 11/26/2014  . Tobacco user 11/26/2014  . Wrist fracture     Past Surgical History:  Procedure Laterality Date  . bilateral tubal ligation  1996  . BREAST CYST EXCISION Left 2002  . KNEE SURGERY     right  . LAPAROSCOPIC SUPRACERVICAL HYSTERECTOMY  08/05/2013  . ORIF WRIST FRACTURE Left 01/17/2017   Procedure: OPEN REDUCTION INTERNAL FIXATION (ORIF) WRIST FRACTURE;  Surgeon: Lovell Sheehan, MD;  Location: [facility] ORS;  Service: Orthopedics;  Laterality: Left;  . TUBAL  LIGATION Bilateral   . VAGINAL HYSTERECTOMY  03/2006    There were no vitals filed for this visit.   Subjective Assessment - 11/04/19 1735    Subjective Patient presents to PT feeling good, no falls or LOB over the weekend. has been compliant with HEP    Pertinent History Patient is previous patient of this therapist, was admitted to the hospital on 08/03/19 for MS exacerbation and then went to inpatient rehab on 08/08/19. Patient then received home health therapy and now is returning to outpatient PT. PMh includes HTN, SVT, neurogenic bowel, MS (1995), neuropathy, hx of wrist fx, HPV, Hypercholesteremia, adiposity, osteopenia, spasticity. She wears bilateral AFOs and uses a RW and/or wheelchair for mobility. She drives an adapted car.    Limitations Lifting;Standing;Walking;House hold activities    How long can you sit comfortably? n/a    How long can you stand comfortably? 4 minutes    How long can you walk comfortably? walked 50 ft in inpatient rehab    Patient Stated Goals return to PLOF, increase walking, get into shower, increase mobility    Currently in Pain? No/denies           Standing in // bars:   weight shifts L, R with focus on hip shift rather than pulling with UE's 10x each side Ambulate length of // bars 2x, cues for upright  posture, weight shift and step length. Focus on foot clearance   standing abduction step laterally: very challenging LLE 6x each LE; no rest break required Tandem stance static stand w/o UE support 10-20 second holds x 3 trials each foot placement; seated rest break required   seated isometric contraction against PT resistance: -hip flexion 10x 3 second hold -hip abduction 10x 3 second hold  -2lb dumbbells: -upward chest press into lateral abduction"chicken wing" x10 -supinated ER 12x -tricep press 12x each UE  -GTB paloff press 10x each side  - Seated hamstring stretch with LE propped onto therapists leg for optimal positioning 60 seconds each  LE     Patient utilizes ice pack for reduction of internal temperature to reduce risk of MS exacerbation and allow continuation of progression of interventions.   Patient continues to present with excellent motivation. She demonstrates improved weight shift allowing for lateral foot movement with decreased shuffle this session as well as able to tolerate increased interventions prior to needing to sit down this session.  The patient would benefit from further skilled PT intervention to maximize QOL, safety, and functional mobility                       PT Education - 11/04/19 1735    Education provided Yes    Education Details exercise technique, body mechanics    Person(s) Educated Patient    Methods Explanation;Demonstration;Tactile cues;Verbal cues    Comprehension Verbalized understanding;Returned demonstration;Verbal cues required;Tactile cues required            PT Short Term Goals - 10/28/19 1207      PT SHORT TERM GOAL #1   Title Patient will be independent in home exercise program to improve strength/mobility for better functional independence with ADLs.    Baseline 5/19: HEP next session 6/22: HEP compliant    Time 4    Period Weeks    Status Partially Met    Target Date 10/22/19      PT SHORT TERM GOAL #2   Title Patient will perform a sit to stand with SUE support for increased ind. with transfers.    Baseline 5/19: heavy BUE support 6/22: requires one hand on walker one hand on w/c    Time 4    Period Weeks    Status On-going    Target Date 10/22/19             PT Long Term Goals - 10/28/19 0001      PT LONG TERM GOAL #1   Title Patient will increase FOTO score to equal to or greater than  53/100   to demonstrate statistically significant improvement in mobility and quality of life.     Baseline 6/22: 46.99% 5/18: 48/100    Time 12    Period Weeks    Status On-going    Target Date 12/16/19      PT LONG TERM GOAL #2   Title Patient  (< 69 years old) will complete five times sit to stand test in < 20 seconds indicating an increased LE strength and improved balance.    Baseline 6/22: 40. 86 sec; unable to reach full stand last two attempts 5/19: 42 seconds     Time 12    Period Weeks    Status Partially Met    Target Date 12/16/19      PT LONG TERM GOAL #3   Title Patient will increase 10 meter walk test to <20 seconds  as to improve gait speed for better community ambulation and to reduce fall risk.    Baseline 6/22: 36.1 seconds 5/19: 42 seconds    Time 12    Period Weeks    Status Partially Met    Target Date 12/16/19      PT LONG TERM GOAL #4   Title Patient will reduce timed up and go to <11 seconds to reduce fall risk and demonstrate improved transfer/gait ability.    Baseline 6/22: 45.4 seconds 5/19: 47 seconds    Time 12    Period Weeks    Status Partially Met    Target Date 12/16/19                 Plan - 11/04/19 1737    Clinical Impression Statement Patient continues to present with excellent motivation. She demonstrates improved weight shift allowing for lateral foot movement with decreased shuffle this session as well as able to tolerate increased interventions prior to needing to sit down this session.  The patient would benefit from further skilled PT intervention to maximize QOL, safety, and functional mobility    Personal Factors and Comorbidities Age;Comorbidity 3+;Fitness;Past/Current Experience;Sex;Time since onset of injury/illness/exacerbation    Comorbidities HTN, SVT, neurogenic bowel, MS (1995), neuropathy, hx of wrist fx, HPV, Hypercholesteremia, adiposity, osteopenia, spasticity.    Examination-Activity Limitations Bathing;Bed Mobility;Bend;Carry;Continence;Dressing;Hygiene/Grooming;Stairs;Squat;Reach Overhead;Locomotion Level;Lift;Stand;Transfers    Examination-Participation Restrictions Church;Cleaning;Community Activity;Laundry;Volunteer;Shop;Meal Prep;Yard Work     [employer]chant [employer] officer Evolving/Moderate complexity    Rehab Potential Good    PT Frequency 2x / week    PT Duration 12 weeks    PT Treatment/Interventions ADLs/Self Care Home Management;Aquatic Therapy;Biofeedback;Cryotherapy;Electrical Stimulation;Iontophoresis 79m/ml Dexamethasone;Traction;Moist Heat;Ultrasound;DME Instruction;Gait training;Stair training;Functional mobility training;Therapeutic activities;Therapeutic exercise;Balance training;Neuromuscular re-education;Patient/family education;Orthotic Fit/Training;Wheelchair mobility training;Manual techniques;Compression bandaging;Passive range of motion;Dry needling;Energy conservation;Taping;Splinting;Vestibular    PT Next Visit Plan step negotiation, turning directions to the L and R    PT Home Exercise Plan maintain current HEP from HHPT    Consulted and Agree with Plan of Care Patient           Patient will benefit from skilled therapeutic intervention in order to improve the following deficits and impairments:  Abnormal gait, Decreased activity tolerance, Decreased balance, Decreased coordination, Decreased endurance, Decreased mobility, Decreased range of motion, Decreased strength, Difficulty walking, Impaired perceived functional ability, Impaired sensation, Impaired tone, Impaired UE functional use, Improper body mechanics, Postural dysfunction, Cardiopulmonary status limiting activity, Impaired flexibility, Increased muscle spasms  Visit Diagnosis: Multiple sclerosis exacerbation (HCC)  Muscle weakness (generalized)  Other abnormalities of gait and mobility  Unsteadiness on feet     Problem List Patient Active Problem List   Diagnosis Date Noted  . Hypoalbuminemia due to protein-calorie malnutrition ([place]   . Neurogenic bowel   . Neurogenic bladder   . Labile blood pressure   . Neuropathic pain   . Abscess of female pelvis   . SVT (supraventricular tachycardia) ([place]   . Radial styloid tenosynovitis  03/12/2018  . Wheelchair confinement 02/27/2018  . Localized osteoporosis with current pathological fracture with routine healing 01/19/2017  . Wrist fracture 01/16/2017  . Sprain of ankle 03/23/2016  . Closed fracture of lateral malleolus 03/16/2016  . Health care maintenance 01/24/2016  . Blood pressure elevated without history of HTN 10/25/2015  . Essential hypertension 10/25/2015  . Multiple sclerosis ([place] 10/02/2015  . Chronic left shoulder pain 07/19/2015  . Multiple sclerosis exacerbation ([place] 07/14/2015  . MS (multiple sclerosis) ([place] 11/26/2014  . Increased body mass index 11/26/2014  .  HPV test positive 11/26/2014  . Status post laparoscopic supracervical hysterectomy 11/26/2014  . Galactorrhea 11/26/2014  . Back ache 05/21/2014  . Adiposity 05/21/2014  . Disordered sleep 05/21/2014  . Muscle spasticity 05/21/2014  . Spasticity 05/21/2014  . Calculus of kidney 12/09/2013  . Renal colic 98/42/1031  . Hypercholesteremia 08/19/2013  . Hereditary and idiopathic neuropathy 08/19/2013  . Hypercholesterolemia without hypertriglyceridemia 08/19/2013  . Bladder infection, chronic 07/25/2012  . Disorder of bladder function 07/25/2012  . Incomplete bladder emptying 07/25/2012  . Microscopic hematuria 07/25/2012  . Right upper quadrant pain 07/25/2012   Janna Arch, PT, DPT   11/04/2019, 5:39 PM  [place] MAIN [facility] SERVICES [address] [place], [place], [address] Phone: [phone]   Fax:  [phone]  Name: Lisa Williams MRN: [id number] Date of Birth: July 13, 1960

## 2019-11-05 ENCOUNTER — Ambulatory Visit: Payer: PPO

## 2019-11-05 ENCOUNTER — Other Ambulatory Visit: Payer: Self-pay

## 2019-11-05 DIAGNOSIS — M6281 Muscle weakness (generalized): Secondary | ICD-10-CM

## 2019-11-05 DIAGNOSIS — R2681 Unsteadiness on feet: Secondary | ICD-10-CM

## 2019-11-05 DIAGNOSIS — G35 Multiple sclerosis: Secondary | ICD-10-CM | POA: Diagnosis not present

## 2019-11-05 DIAGNOSIS — R2689 Other abnormalities of gait and mobility: Secondary | ICD-10-CM

## 2019-11-05 NOTE — Therapy (Signed)
[place] MAIN [facility] SERVICES [address] [place], [place], [address] Phone: [phone]   Fax:  [phone]  Physical Therapy Treatment  Patient Details  Name: Lisa Williams MRN: [id number] Date of Birth: May 23, 1960 Referring Provider (PT): Gurney Maxin, MD   Encounter Date: 11/05/2019   PT End of Session - 11/05/19 1250    Visit Number 13    Number of Visits 24    Date for PT Re-Evaluation 12/16/19    Authorization Type 3/10 PN 6/22    PT Start Time 1115    PT Stop Time 1159    PT Time Calculation (min) 44 min    Equipment Utilized During Treatment Gait belt;Other (comment)   bilateral AFOs   Activity Tolerance Patient tolerated treatment well    Behavior During Therapy WFL for tasks assessed/performed           Past Medical History:  Diagnosis Date  . Abdominal pain, right upper quadrant   . Back pain   . Calculus of kidney 12/09/2013  . Chronic back pain    unspecified  . Chronic left shoulder pain 07/19/2015  . Functional disorder of bladder    other  . Galactorrhea 11/26/2014   Chronic   . Hereditary and idiopathic neuropathy 08/19/2013  . HPV test positive   . Hypercholesteremia 08/19/2013  . Incomplete bladder emptying   . Microscopic hematuria   . MS (multiple sclerosis) ([place])   . Muscle spasticity 05/21/2014  . Nonspecific findings on examination of urine    other  . Osteopenia   . Status post laparoscopic supracervical hysterectomy 11/26/2014  . Tobacco user 11/26/2014  . Wrist fracture     Past Surgical History:  Procedure Laterality Date  . bilateral tubal ligation  1996  . BREAST CYST EXCISION Left 2002  . KNEE SURGERY     right  . LAPAROSCOPIC SUPRACERVICAL HYSTERECTOMY  08/05/2013  . ORIF WRIST FRACTURE Left 01/17/2017   Procedure: OPEN REDUCTION INTERNAL FIXATION (ORIF) WRIST FRACTURE;  Surgeon: Lovell Sheehan, MD;  Location: [facility] ORS;  Service: Orthopedics;  Laterality: Left;  . TUBAL  LIGATION Bilateral   . VAGINAL HYSTERECTOMY  03/2006    There were no vitals filed for this visit.   Subjective Assessment - 11/05/19 1248    Subjective Patient has been doing well, compliant with HEP, feeling good after yesterday.    Pertinent History Patient is previous patient of this therapist, was admitted to the hospital on 08/03/19 for MS exacerbation and then went to inpatient rehab on 08/08/19. Patient then received home health therapy and now is returning to outpatient PT. PMh includes HTN, SVT, neurogenic bowel, MS (1995), neuropathy, hx of wrist fx, HPV, Hypercholesteremia, adiposity, osteopenia, spasticity. She wears bilateral AFOs and uses a RW and/or wheelchair for mobility. She drives an adapted car.    Limitations Lifting;Standing;Walking;House hold activities    How long can you sit comfortably? n/a    How long can you stand comfortably? 4 minutes    How long can you walk comfortably? walked 50 ft in inpatient rehab    Patient Stated Goals return to PLOF, increase walking, get into shower, increase mobility    Currently in Pain? No/denies             Treatment:  Ambulate 100 ft with RW and wheelchair follow with CGA, cues for foot clearance and sequencing. Furthest ambulation to date for patient.  Seated:  5lb bar:  -reverse row 15x; cues for scapular  retraction -overhead press 15x cues for scapular retraction - paddle row 15x alternating side; cues for core contraction with upright posture   balloon taps reaching inside/outside BOS for core stability/pertubations. X 6 minutes   Seated hamstring stretch with LE propped onto therapists leg for optimal positioning 60 seconds each LE x 3 trials each LE      Bathroom mobility: STS walk into bathroom, single UE support for STS to/from toilet, assist in negotiating curves  In bathroom.      Patient utilizes ice pack for reduction of internal temperature to reduce risk of MS exacerbation and allow continuation of  progression of interventions.                    PT Education - 11/05/19 1250    Education provided Yes    Education Details exercise technique, body mechanics    Person(s) Educated Patient    Methods Explanation;Demonstration;Tactile cues;Verbal cues    Comprehension Verbalized understanding;Returned demonstration;Verbal cues required;Tactile cues required            PT Short Term Goals - 10/28/19 1207      PT SHORT TERM GOAL #1   Title Patient will be independent in home exercise program to improve strength/mobility for better functional independence with ADLs.    Baseline 5/19: HEP next session 6/22: HEP compliant    Time 4    Period Weeks    Status Partially Met    Target Date 10/22/19      PT SHORT TERM GOAL #2   Title Patient will perform a sit to stand with SUE support for increased ind. with transfers.    Baseline 5/19: heavy BUE support 6/22: requires one hand on walker one hand on w/c    Time 4    Period Weeks    Status On-going    Target Date 10/22/19             PT Long Term Goals - 10/28/19 0001      PT LONG TERM GOAL #1   Title Patient will increase FOTO score to equal to or greater than  53/100   to demonstrate statistically significant improvement in mobility and quality of life.     Baseline 6/22: 46.99% 5/18: 48/100    Time 12    Period Weeks    Status On-going    Target Date 12/16/19      PT LONG TERM GOAL #2   Title Patient (< 58 years old) will complete five times sit to stand test in < 20 seconds indicating an increased LE strength and improved balance.    Baseline 6/22: 40. 86 sec; unable to reach full stand last two attempts 5/19: 42 seconds     Time 12    Period Weeks    Status Partially Met    Target Date 12/16/19      PT LONG TERM GOAL #3   Title Patient will increase 10 meter walk test to <20 seconds as to improve gait speed for better community ambulation and to reduce fall risk.    Baseline 6/22: 36.1 seconds 5/19:  42 seconds    Time 12    Period Weeks    Status Partially Met    Target Date 12/16/19      PT LONG TERM GOAL #4   Title Patient will reduce timed up and go to <11 seconds to reduce fall risk and demonstrate improved transfer/gait ability.    Baseline 6/22: 45.4 seconds 5/19: 47  seconds    Time 12    Period Weeks    Status Partially Met    Target Date 12/16/19                 Plan - 11/05/19 1256    Clinical Impression Statement Patient demonstrated excellent capacity for ambulation with furthest ambulation distance to this date total; ambulating 100 ft without rest breaks this session. Bathroom mobility performed with assistance for negotiation of the turns and stalls. The patient would benefit from further skilled PT intervention to maximize QOL, safety, and functional mobility    Personal Factors and Comorbidities Age;Comorbidity 3+;Fitness;Past/Current Experience;Sex;Time since onset of injury/illness/exacerbation    Comorbidities HTN, SVT, neurogenic bowel, MS (1995), neuropathy, hx of wrist fx, HPV, Hypercholesteremia, adiposity, osteopenia, spasticity.    Examination-Activity Limitations Bathing;Bed Mobility;Bend;Carry;Continence;Dressing;Hygiene/Grooming;Stairs;Squat;Reach Overhead;Locomotion Level;Lift;Stand;Transfers    Examination-Participation Restrictions Church;Cleaning;Community Activity;Laundry;Volunteer;Shop;Meal Prep;Yard Work    [employer]chant [employer] officer Evolving/Moderate complexity    Rehab Potential Good    PT Frequency 2x / week    PT Duration 12 weeks    PT Treatment/Interventions ADLs/Self Care Home Management;Aquatic Therapy;Biofeedback;Cryotherapy;Electrical Stimulation;Iontophoresis 70m/ml Dexamethasone;Traction;Moist Heat;Ultrasound;DME Instruction;Gait training;Stair training;Functional mobility training;Therapeutic activities;Therapeutic exercise;Balance training;Neuromuscular re-education;Patient/family education;Orthotic Fit/Training;Wheelchair  mobility training;Manual techniques;Compression bandaging;Passive range of motion;Dry needling;Energy conservation;Taping;Splinting;Vestibular    PT Next Visit Plan step negotiation, turning directions to the L and R    PT Home Exercise Plan maintain current HEP from HHPT    Consulted and Agree with Plan of Care Patient           Patient will benefit from skilled therapeutic intervention in order to improve the following deficits and impairments:  Abnormal gait, Decreased activity tolerance, Decreased balance, Decreased coordination, Decreased endurance, Decreased mobility, Decreased range of motion, Decreased strength, Difficulty walking, Impaired perceived functional ability, Impaired sensation, Impaired tone, Impaired UE functional use, Improper body mechanics, Postural dysfunction, Cardiopulmonary status limiting activity, Impaired flexibility, Increased muscle spasms  Visit Diagnosis: Multiple sclerosis exacerbation (HCC)  Muscle weakness (generalized)  Other abnormalities of gait and mobility  Unsteadiness on feet     Problem List Patient Active Problem List   Diagnosis Date Noted  . Hypoalbuminemia due to protein-calorie malnutrition ([place]   . Neurogenic bowel   . Neurogenic bladder   . Labile blood pressure   . Neuropathic pain   . Abscess of female pelvis   . SVT (supraventricular tachycardia) ([place]   . Radial styloid tenosynovitis 03/12/2018  . Wheelchair confinement 02/27/2018  . Localized osteoporosis with current pathological fracture with routine healing 01/19/2017  . Wrist fracture 01/16/2017  . Sprain of ankle 03/23/2016  . Closed fracture of lateral malleolus 03/16/2016  . Health care maintenance 01/24/2016  . Blood pressure elevated without history of HTN 10/25/2015  . Essential hypertension 10/25/2015  . Multiple sclerosis ([place] 10/02/2015  . Chronic left shoulder pain 07/19/2015  . Multiple sclerosis exacerbation ([place] 07/14/2015  . MS (multiple  sclerosis) ([place] 11/26/2014  . Increased body mass index 11/26/2014  . HPV test positive 11/26/2014  . Status post laparoscopic supracervical hysterectomy 11/26/2014  . Galactorrhea 11/26/2014  . Back ache 05/21/2014  . Adiposity 05/21/2014  . Disordered sleep 05/21/2014  . Muscle spasticity 05/21/2014  . Spasticity 05/21/2014  . Calculus of kidney 12/09/2013  . Renal colic 024/23/5361 . Hypercholesteremia 08/19/2013  . Hereditary and idiopathic neuropathy 08/19/2013  . Hypercholesterolemia without hypertriglyceridemia 08/19/2013  . Bladder infection, chronic 07/25/2012  . Disorder of bladder function 07/25/2012  . Incomplete bladder emptying 07/25/2012  . Microscopic hematuria 07/25/2012  .  Right upper quadrant pain 07/25/2012   Janna Arch, PT, DPT   11/05/2019, 12:58 PM  [place] MAIN [facility] SERVICES [address] [place], [place], [address] Phone: [phone]   Fax:  [phone]  Name: Sloka Volante Sisler MRN: [id number] Date of Birth: Jun 02, 1960

## 2019-11-06 ENCOUNTER — Ambulatory Visit: Payer: PPO

## 2019-11-11 ENCOUNTER — Other Ambulatory Visit: Payer: Self-pay

## 2019-11-11 ENCOUNTER — Ambulatory Visit: Payer: PPO | Attending: Neurology

## 2019-11-11 DIAGNOSIS — R2689 Other abnormalities of gait and mobility: Secondary | ICD-10-CM | POA: Diagnosis not present

## 2019-11-11 DIAGNOSIS — G35 Multiple sclerosis: Secondary | ICD-10-CM

## 2019-11-11 DIAGNOSIS — M6281 Muscle weakness (generalized): Secondary | ICD-10-CM | POA: Insufficient documentation

## 2019-11-11 DIAGNOSIS — R2681 Unsteadiness on feet: Secondary | ICD-10-CM | POA: Diagnosis not present

## 2019-11-11 NOTE — Therapy (Signed)
[place] MAIN [facility] SERVICES [address] [place], [place], [address] Phone: [phone]   Fax:  [phone]  Physical Therapy Treatment  Patient Details  Name: Lisa Williams MRN: [id number] Date of Birth: 1960/11/30 Referring Provider (PT): Gurney Maxin, MD   Encounter Date: 11/11/2019   PT End of Session - 11/12/19 1005    Visit Number 14    Number of Visits 24    Date for PT Re-Evaluation 12/16/19    Authorization Type 4/10 PN 6/22    PT Start Time 1015    PT Stop Time 1059    PT Time Calculation (min) 44 min    Equipment Utilized During Treatment Gait belt;Other (comment)   bilateral AFOs   Activity Tolerance Patient tolerated treatment well    Behavior During Therapy WFL for tasks assessed/performed           Past Medical History:  Diagnosis Date  . Abdominal pain, right upper quadrant   . Back pain   . Calculus of kidney 12/09/2013  . Chronic back pain    unspecified  . Chronic left shoulder pain 07/19/2015  . Functional disorder of bladder    other  . Galactorrhea 11/26/2014   Chronic   . Hereditary and idiopathic neuropathy 08/19/2013  . HPV test positive   . Hypercholesteremia 08/19/2013  . Incomplete bladder emptying   . Microscopic hematuria   . MS (multiple sclerosis) ([place])   . Muscle spasticity 05/21/2014  . Nonspecific findings on examination of urine    other  . Osteopenia   . Status post laparoscopic supracervical hysterectomy 11/26/2014  . Tobacco user 11/26/2014  . Wrist fracture     Past Surgical History:  Procedure Laterality Date  . bilateral tubal ligation  1996  . BREAST CYST EXCISION Left 2002  . KNEE SURGERY     right  . LAPAROSCOPIC SUPRACERVICAL HYSTERECTOMY  08/05/2013  . ORIF WRIST FRACTURE Left 01/17/2017   Procedure: OPEN REDUCTION INTERNAL FIXATION (ORIF) WRIST FRACTURE;  Surgeon: Lovell Sheehan, MD;  Location: [facility] ORS;  Service: Orthopedics;  Laterality: Left;  . TUBAL  LIGATION Bilateral   . VAGINAL HYSTERECTOMY  03/2006    There were no vitals filed for this visit.   Subjective Assessment - 11/11/19 1729    Subjective Patient reports having a good weekend but is feeling hot today due to the high temperatures outside.    Pertinent History Patient is previous patient of this therapist, was admitted to the hospital on 08/03/19 for MS exacerbation and then went to inpatient rehab on 08/08/19. Patient then received home health therapy and now is returning to outpatient PT. PMh includes HTN, SVT, neurogenic bowel, MS (1995), neuropathy, hx of wrist fx, HPV, Hypercholesteremia, adiposity, osteopenia, spasticity. She wears bilateral AFOs and uses a RW and/or wheelchair for mobility. She drives an adapted car.    Limitations Lifting;Standing;Walking;House hold activities    How long can you sit comfortably? n/a    How long can you stand comfortably? 4 minutes    How long can you walk comfortably? walked 50 ft in inpatient rehab    Patient Stated Goals return to PLOF, increase walking, get into shower, increase mobility    Currently in Pain? No/denies                  Standing in // bars:   weight shifts L, R with focus on hip shift rather than pulling with UE's 10x each side Step over 1.5  inch raised theraband across bars, 5x each LE, two episodes requiring min A to return to starting position. Tandem stance static stand w/o UE support 10-20 second holds x 3 trials each foot placement; seated rest break required   seated isometric contraction against PT resistance: -hip flexion 10x 3 second hold -hip abduction 10x 3 second hold -hip adduction 10x 3 second hold   Seated boxing: cues for sequencing, body mechanics, and pace/rhythm for functional contraction and timing of muscle recruitment: -Cross body punches to mitts on PT hands with no back support for focused core stabilization with crossing midline 2x 60 seconds  -Cross body punch with secondary  combination of elbow for full cross body rotation to PT mitt for trunk stability, coordination, and cardiovascular challenge x 60 seconds -Upper cut to mitts in PT hands for core activation without back support with perturbations 60 seconds  Modified french pectoral stretch 2x 30 second holds   Seated hamstring stretch with LE propped onto therapists leg for optimal positioning 60 seconds each LE     Patient utilizes ice pack for reduction of internal temperature to reduce risk of MS exacerbation and allow continuation of progression of interventions.                       PT Education - 11/12/19 1005    Education provided Yes    Education Details exercise technique, body mechanics    Person(s) Educated Patient    Methods Explanation;Demonstration;Tactile cues;Verbal cues    Comprehension Verbalized understanding;Returned demonstration;Verbal cues required;Tactile cues required            PT Short Term Goals - 10/28/19 1207      PT SHORT TERM GOAL #1   Title Patient will be independent in home exercise program to improve strength/mobility for better functional independence with ADLs.    Baseline 5/19: HEP next session 6/22: HEP compliant    Time 4    Period Weeks    Status Partially Met    Target Date 10/22/19      PT SHORT TERM GOAL #2   Title Patient will perform a sit to stand with SUE support for increased ind. with transfers.    Baseline 5/19: heavy BUE support 6/22: requires one hand on walker one hand on w/c    Time 4    Period Weeks    Status On-going    Target Date 10/22/19             PT Long Term Goals - 10/28/19 0001      PT LONG TERM GOAL #1   Title Patient will increase FOTO score to equal to or greater than  53/100   to demonstrate statistically significant improvement in mobility and quality of life.     Baseline 6/22: 46.99% 5/18: 48/100    Time 12    Period Weeks    Status On-going    Target Date 12/16/19      PT LONG TERM  GOAL #2   Title Patient (< 30 years old) will complete five times sit to stand test in < 20 seconds indicating an increased LE strength and improved balance.    Baseline 6/22: 40. 86 sec; unable to reach full stand last two attempts 5/19: 42 seconds     Time 12    Period Weeks    Status Partially Met    Target Date 12/16/19      PT LONG TERM GOAL #3   Title Patient  will increase 10 meter walk test to <20 seconds as to improve gait speed for better community ambulation and to reduce fall risk.    Baseline 6/22: 36.1 seconds 5/19: 42 seconds    Time 12    Period Weeks    Status Partially Met    Target Date 12/16/19      PT LONG TERM GOAL #4   Title Patient will reduce timed up and go to <11 seconds to reduce fall risk and demonstrate improved transfer/gait ability.    Baseline 6/22: 45.4 seconds 5/19: 47 seconds    Time 12    Period Weeks    Status Partially Met    Target Date 12/16/19                 Plan - 11/12/19 1006    Clinical Impression Statement Patient is challenged with overheating this session requiring more frequent rest breaks and use of ice than previous sessions. Education and performance of exercise tolerance and pacing of self for safety performed.  The patient would benefit from further skilled PT intervention to maximize QOL, safety, and functional mobility    Personal Factors and Comorbidities Age;Comorbidity 3+;Fitness;Past/Current Experience;Sex;Time since onset of injury/illness/exacerbation    Comorbidities HTN, SVT, neurogenic bowel, MS (1995), neuropathy, hx of wrist fx, HPV, Hypercholesteremia, adiposity, osteopenia, spasticity.    Examination-Activity Limitations Bathing;Bed Mobility;Bend;Carry;Continence;Dressing;Hygiene/Grooming;Stairs;Squat;Reach Overhead;Locomotion Level;Lift;Stand;Transfers    Examination-Participation Restrictions Church;Cleaning;Community Activity;Laundry;Volunteer;Shop;Meal Prep;Yard Work    [employer] officer  Evolving/Moderate complexity    Rehab Potential Good    PT Frequency 2x / week    PT Duration 12 weeks    PT Treatment/Interventions ADLs/Self Care Home Management;Aquatic Therapy;Biofeedback;Cryotherapy;Electrical Stimulation;Iontophoresis 83m/ml Dexamethasone;Traction;Moist Heat;Ultrasound;DME Instruction;Gait training;Stair training;Functional mobility training;Therapeutic activities;Therapeutic exercise;Balance training;Neuromuscular re-education;Patient/family education;Orthotic Fit/Training;Wheelchair mobility training;Manual techniques;Compression bandaging;Passive range of motion;Dry needling;Energy conservation;Taping;Splinting;Vestibular    PT Next Visit Plan step negotiation, turning directions to the L and R    PT Home Exercise Plan maintain current HEP from HHPT    Consulted and Agree with Plan of Care Patient           Patient will benefit from skilled therapeutic intervention in order to improve the following deficits and impairments:  Abnormal gait, Decreased activity tolerance, Decreased balance, Decreased coordination, Decreased endurance, Decreased mobility, Decreased range of motion, Decreased strength, Difficulty walking, Impaired perceived functional ability, Impaired sensation, Impaired tone, Impaired UE functional use, Improper body mechanics, Postural dysfunction, Cardiopulmonary status limiting activity, Impaired flexibility, Increased muscle spasms  Visit Diagnosis: Multiple sclerosis exacerbation (HCC)  Muscle weakness (generalized)  Other abnormalities of gait and mobility  Unsteadiness on feet     Problem List Patient Active Problem List   Diagnosis Date Noted  . Hypoalbuminemia due to protein-calorie malnutrition ([place]   . Neurogenic bowel   . Neurogenic bladder   . Labile blood pressure   . Neuropathic pain   . Abscess of female pelvis   . SVT (supraventricular tachycardia) ([place]   . Radial styloid tenosynovitis 03/12/2018  . Wheelchair  confinement 02/27/2018  . Localized osteoporosis with current pathological fracture with routine healing 01/19/2017  . Wrist fracture 01/16/2017  . Sprain of ankle 03/23/2016  . Closed fracture of lateral malleolus 03/16/2016  . Health care maintenance 01/24/2016  . Blood pressure elevated without history of HTN 10/25/2015  . Essential hypertension 10/25/2015  . Multiple sclerosis ([place] 10/02/2015  . Chronic left shoulder pain 07/19/2015  . Multiple sclerosis exacerbation ([place] 07/14/2015  . MS (multiple sclerosis) ([place] 11/26/2014  . Increased body mass  index 11/26/2014  . HPV test positive 11/26/2014  . Status post laparoscopic supracervical hysterectomy 11/26/2014  . Galactorrhea 11/26/2014  . Back ache 05/21/2014  . Adiposity 05/21/2014  . Disordered sleep 05/21/2014  . Muscle spasticity 05/21/2014  . Spasticity 05/21/2014  . Calculus of kidney 12/09/2013  . Renal colic 08/91/0026  . Hypercholesteremia 08/19/2013  . Hereditary and idiopathic neuropathy 08/19/2013  . Hypercholesterolemia without hypertriglyceridemia 08/19/2013  . Bladder infection, chronic 07/25/2012  . Disorder of bladder function 07/25/2012  . Incomplete bladder emptying 07/25/2012  . Microscopic hematuria 07/25/2012  . Right upper quadrant pain 07/25/2012   Janna Arch, PT, DPT   11/12/2019, 10:07 AM  [place] MAIN [facility] SERVICES [address] [place], [place], [address] Phone: [phone]   Fax:  [phone]  Name: Lisa Williams MRN: [id number] Date of Birth: May 02, 1961

## 2019-11-13 ENCOUNTER — Ambulatory Visit: Payer: PPO

## 2019-11-13 ENCOUNTER — Other Ambulatory Visit: Payer: Self-pay

## 2019-11-13 DIAGNOSIS — R2681 Unsteadiness on feet: Secondary | ICD-10-CM

## 2019-11-13 DIAGNOSIS — G35 Multiple sclerosis: Secondary | ICD-10-CM | POA: Diagnosis not present

## 2019-11-13 DIAGNOSIS — M6281 Muscle weakness (generalized): Secondary | ICD-10-CM

## 2019-11-13 DIAGNOSIS — R2689 Other abnormalities of gait and mobility: Secondary | ICD-10-CM

## 2019-11-13 NOTE — Therapy (Signed)
[place] MAIN [facility] SERVICES [address] [place], [place], [address] Phone: [phone]   Fax:  [phone]  Physical Therapy Treatment  Patient Details  Name: Lisa Williams MRN: [id number] Date of Birth: 1961-02-03 Referring Provider (PT): Gurney Maxin, MD   Encounter Date: 11/13/2019   PT End of Session - 11/14/19 1041    Visit Number 15    Number of Visits 24    Date for PT Re-Evaluation 12/16/19    Authorization Type 5/10 PN 6/22    PT Start Time 1027    PT Stop Time 1113    PT Time Calculation (min) 46 min    Equipment Utilized During Treatment Gait belt;Other (comment)   bilateral AFOs   Activity Tolerance Patient tolerated treatment well    Behavior During Therapy WFL for tasks assessed/performed           Past Medical History:  Diagnosis Date  . Abdominal pain, right upper quadrant   . Back pain   . Calculus of kidney 12/09/2013  . Chronic back pain    unspecified  . Chronic left shoulder pain 07/19/2015  . Functional disorder of bladder    other  . Galactorrhea 11/26/2014   Chronic   . Hereditary and idiopathic neuropathy 08/19/2013  . HPV test positive   . Hypercholesteremia 08/19/2013  . Incomplete bladder emptying   . Microscopic hematuria   . MS (multiple sclerosis) ([place])   . Muscle spasticity 05/21/2014  . Nonspecific findings on examination of urine    other  . Osteopenia   . Status post laparoscopic supracervical hysterectomy 11/26/2014  . Tobacco user 11/26/2014  . Wrist fracture     Past Surgical History:  Procedure Laterality Date  . bilateral tubal ligation  1996  . BREAST CYST EXCISION Left 2002  . KNEE SURGERY     right  . LAPAROSCOPIC SUPRACERVICAL HYSTERECTOMY  08/05/2013  . ORIF WRIST FRACTURE Left 01/17/2017   Procedure: OPEN REDUCTION INTERNAL FIXATION (ORIF) WRIST FRACTURE;  Surgeon: Lovell Sheehan, MD;  Location: [facility] ORS;  Service: Orthopedics;  Laterality: Left;  . TUBAL  LIGATION Bilateral   . VAGINAL HYSTERECTOMY  03/2006    There were no vitals filed for this visit.   Subjective Assessment - 11/14/19 1039    Subjective Patient reports she was able to help do laundry for the first time yesterday. No falls or LOB since last session.    Pertinent History Patient is previous patient of this therapist, was admitted to the hospital on 08/03/19 for MS exacerbation and then went to inpatient rehab on 08/08/19. Patient then received home health therapy and now is returning to outpatient PT. PMh includes HTN, SVT, neurogenic bowel, MS (1995), neuropathy, hx of wrist fx, HPV, Hypercholesteremia, adiposity, osteopenia, spasticity. She wears bilateral AFOs and uses a RW and/or wheelchair for mobility. She drives an adapted car.    Limitations Lifting;Standing;Walking;House hold activities    How long can you sit comfortably? n/a    How long can you stand comfortably? 4 minutes    How long can you walk comfortably? walked 50 ft in inpatient rehab    Patient Stated Goals return to PLOF, increase walking, get into shower, increase mobility    Currently in Pain? No/denies              Treatment    Treatment:   Ambulate 70 ft with RW and wheelchair follow with CGA, cues for foot clearance and sequencing. Furthest ambulation to  date for patient.   Standing reach for balls in bucket to the R, then stand back up and toss into hoops, x 8   Seated:  Reach outside BOS grab a basketball, sit upright without support and throw into hoop x 4 minutes  balloon taps reaching inside/outside BOS for core stability/pertubations. X 6 minutes    Seated hamstring stretch with LE propped onto therapists leg for optimal positioning 60 seconds each LE x 3 trials each LE   isometric contraction against PT resistance: -hip flexion 10x 3 second hold -hip abduction 10x 3 second hold   Ball adduction squeeze between knees 10x 5 second holds  glute squeeze 12x 3 second holds  Patient  utilizes ice pack for reduction of internal temperature to reduce risk of MS exacerbation and allow continuation of progression of interventions.   Patient demonstrates improved stability in standing position with great reach to grab and object and ability to retain COM against pertubation's. Upright posture with reaching in seated is additionally progressing with increased length of time tolerated. The patient would benefit from further skilled PT intervention to maximize QOL, safety, and functional mobility                   PT Education - 11/14/19 1040    Education provided Yes    Education Details exercise technique, body mechanics    Person(s) Educated Patient    Methods Explanation;Demonstration;Tactile cues;Verbal cues    Comprehension Verbalized understanding;Returned demonstration;Verbal cues required;Tactile cues required            PT Short Term Goals - 10/28/19 1207      PT SHORT TERM GOAL #1   Title Patient will be independent in home exercise program to improve strength/mobility for better functional independence with ADLs.    Baseline 5/19: HEP next session 6/22: HEP compliant    Time 4    Period Weeks    Status Partially Met    Target Date 10/22/19      PT SHORT TERM GOAL #2   Title Patient will perform a sit to stand with SUE support for increased ind. with transfers.    Baseline 5/19: heavy BUE support 6/22: requires one hand on walker one hand on w/c    Time 4    Period Weeks    Status On-going    Target Date 10/22/19             PT Long Term Goals - 10/28/19 0001      PT LONG TERM GOAL #1   Title Patient will increase FOTO score to equal to or greater than  53/100   to demonstrate statistically significant improvement in mobility and quality of life.     Baseline 6/22: 46.99% 5/18: 48/100    Time 12    Period Weeks    Status On-going    Target Date 12/16/19      PT LONG TERM GOAL #2   Title Patient (< 49 years old) will complete  five times sit to stand test in < 20 seconds indicating an increased LE strength and improved balance.    Baseline 6/22: 40. 86 sec; unable to reach full stand last two attempts 5/19: 42 seconds     Time 12    Period Weeks    Status Partially Met    Target Date 12/16/19      PT LONG TERM GOAL #3   Title Patient will increase 10 meter walk test to <20 seconds as to  improve gait speed for better community ambulation and to reduce fall risk.    Baseline 6/22: 36.1 seconds 5/19: 42 seconds    Time 12    Period Weeks    Status Partially Met    Target Date 12/16/19      PT LONG TERM GOAL #4   Title Patient will reduce timed up and go to <11 seconds to reduce fall risk and demonstrate improved transfer/gait ability.    Baseline 6/22: 45.4 seconds 5/19: 47 seconds    Time 12    Period Weeks    Status Partially Met    Target Date 12/16/19                 Plan - 11/14/19 1042    Clinical Impression Statement Patient demonstrates improved stability in standing position with great reach to grab and object and ability to retain COM against pertubation's. Upright posture with reaching in seated is additionally progressing with increased length of time tolerated. The patient would benefit from further skilled PT intervention to maximize QOL, safety, and functional mobility    Personal Factors and Comorbidities Age;Comorbidity 3+;Fitness;Past/Current Experience;Sex;Time since onset of injury/illness/exacerbation    Comorbidities HTN, SVT, neurogenic bowel, MS (1995), neuropathy, hx of wrist fx, HPV, Hypercholesteremia, adiposity, osteopenia, spasticity.    Examination-Activity Limitations Bathing;Bed Mobility;Bend;Carry;Continence;Dressing;Hygiene/Grooming;Stairs;Squat;Reach Overhead;Locomotion Level;Lift;Stand;Transfers    Examination-Participation Restrictions Church;Cleaning;Community Activity;Laundry;Volunteer;Shop;Meal Prep;Yard Work    [employer]chant [employer] officer Evolving/Moderate  complexity    Rehab Potential Good    PT Frequency 2x / week    PT Duration 12 weeks    PT Treatment/Interventions ADLs/Self Care Home Management;Aquatic Therapy;Biofeedback;Cryotherapy;Electrical Stimulation;Iontophoresis 28m/ml Dexamethasone;Traction;Moist Heat;Ultrasound;DME Instruction;Gait training;Stair training;Functional mobility training;Therapeutic activities;Therapeutic exercise;Balance training;Neuromuscular re-education;Patient/family education;Orthotic Fit/Training;Wheelchair mobility training;Manual techniques;Compression bandaging;Passive range of motion;Dry needling;Energy conservation;Taping;Splinting;Vestibular    PT Next Visit Plan step negotiation, turning directions to the L and R    PT Home Exercise Plan maintain current HEP from HHPT    Consulted and Agree with Plan of Care Patient           Patient will benefit from skilled therapeutic intervention in order to improve the following deficits and impairments:  Abnormal gait, Decreased activity tolerance, Decreased balance, Decreased coordination, Decreased endurance, Decreased mobility, Decreased range of motion, Decreased strength, Difficulty walking, Impaired perceived functional ability, Impaired sensation, Impaired tone, Impaired UE functional use, Improper body mechanics, Postural dysfunction, Cardiopulmonary status limiting activity, Impaired flexibility, Increased muscle spasms  Visit Diagnosis: Multiple sclerosis exacerbation (HCC)  Muscle weakness (generalized)  Other abnormalities of gait and mobility  Unsteadiness on feet     Problem List Patient Active Problem List   Diagnosis Date Noted  . Hypoalbuminemia due to protein-calorie malnutrition ([place]   . Neurogenic bowel   . Neurogenic bladder   . Labile blood pressure   . Neuropathic pain   . Abscess of female pelvis   . SVT (supraventricular tachycardia) ([place]   . Radial styloid tenosynovitis 03/12/2018  . Wheelchair confinement 02/27/2018  .  Localized osteoporosis with current pathological fracture with routine healing 01/19/2017  . Wrist fracture 01/16/2017  . Sprain of ankle 03/23/2016  . Closed fracture of lateral malleolus 03/16/2016  . Health care maintenance 01/24/2016  . Blood pressure elevated without history of HTN 10/25/2015  . Essential hypertension 10/25/2015  . Multiple sclerosis ([place] 10/02/2015  . Chronic left shoulder pain 07/19/2015  . Multiple sclerosis exacerbation ([place] 07/14/2015  . MS (multiple sclerosis) ([place] 11/26/2014  . Increased body mass index 11/26/2014  . HPV test positive 11/26/2014  .  Status post laparoscopic supracervical hysterectomy 11/26/2014  . Galactorrhea 11/26/2014  . Back ache 05/21/2014  . Adiposity 05/21/2014  . Disordered sleep 05/21/2014  . Muscle spasticity 05/21/2014  . Spasticity 05/21/2014  . Calculus of kidney 12/09/2013  . Renal colic 40/68/4033  . Hypercholesteremia 08/19/2013  . Hereditary and idiopathic neuropathy 08/19/2013  . Hypercholesterolemia without hypertriglyceridemia 08/19/2013  . Bladder infection, chronic 07/25/2012  . Disorder of bladder function 07/25/2012  . Incomplete bladder emptying 07/25/2012  . Microscopic hematuria 07/25/2012  . Right upper quadrant pain 07/25/2012   Janna Arch, PT, DPT   11/14/2019, 10:45 AM  [place] MAIN [facility] SERVICES [address] [place], [place], [address] Phone: [phone]   Fax:  [phone]  Name: Lisa Williams MRN: [id number] Date of Birth: 26-Oct-1960

## 2019-11-18 ENCOUNTER — Other Ambulatory Visit: Payer: Self-pay

## 2019-11-18 ENCOUNTER — Ambulatory Visit: Payer: PPO

## 2019-11-18 DIAGNOSIS — R2681 Unsteadiness on feet: Secondary | ICD-10-CM

## 2019-11-18 DIAGNOSIS — M6281 Muscle weakness (generalized): Secondary | ICD-10-CM

## 2019-11-18 DIAGNOSIS — R2689 Other abnormalities of gait and mobility: Secondary | ICD-10-CM

## 2019-11-18 DIAGNOSIS — G35 Multiple sclerosis: Secondary | ICD-10-CM | POA: Diagnosis not present

## 2019-11-18 NOTE — Therapy (Signed)
[place] MAIN [facility] SERVICES [place], [place], [address] Phone: [phone]   Fax:  [phone]  Physical Therapy Treatment  Patient Details  Name: Lisa Williams MRN: [id number] Date of Birth: 1961-05-04 Referring Provider (PT): Gurney Maxin, MD   Encounter Date: 11/18/2019   PT End of Session - 11/18/19 1225    Visit Number 16    Number of Visits 24    Date for PT Re-Evaluation 12/16/19    Authorization Type 6/10 PN 6/22    PT Start Time 1014    PT Stop Time 1056    PT Time Calculation (min) 42 min    Equipment Utilized During Treatment Gait belt;Other (comment)   bilateral AFOs   Activity Tolerance Patient tolerated treatment well    Behavior During Therapy WFL for tasks assessed/performed           Past Medical History:  Diagnosis Date  . Abdominal pain, right upper quadrant   . Back pain   . Calculus of kidney 12/09/2013  . Chronic back pain    unspecified  . Chronic left shoulder pain 07/19/2015  . Functional disorder of bladder    other  . Galactorrhea 11/26/2014   Chronic   . Hereditary and idiopathic neuropathy 08/19/2013  . HPV test positive   . Hypercholesteremia 08/19/2013  . Incomplete bladder emptying   . Microscopic hematuria   . MS (multiple sclerosis) ([place])   . Muscle spasticity 05/21/2014  . Nonspecific findings on examination of urine    other  . Osteopenia   . Status post laparoscopic supracervical hysterectomy 11/26/2014  . Tobacco user 11/26/2014  . Wrist fracture     Past Surgical History:  Procedure Laterality Date  . bilateral tubal ligation  1996  . BREAST CYST EXCISION Left 2002  . KNEE SURGERY     right  . LAPAROSCOPIC SUPRACERVICAL HYSTERECTOMY  08/05/2013  . ORIF WRIST FRACTURE Left 01/17/2017   Procedure: OPEN REDUCTION INTERNAL FIXATION (ORIF) WRIST FRACTURE;  Surgeon: Lovell Sheehan, MD;  Location: [facility] ORS;  Service: Orthopedics;  Laterality: Left;  . TUBAL  LIGATION Bilateral   . VAGINAL HYSTERECTOMY  03/2006    There were no vitals filed for this visit.   Subjective Assessment - 11/18/19 1222    Subjective Patient reports having a good day. No falls or LOB since last session, compliant with HEP.    Pertinent History Patient is previous patient of this therapist, was admitted to the hospital on 08/03/19 for MS exacerbation and then went to inpatient rehab on 08/08/19. Patient then received home health therapy and now is returning to outpatient PT. PMh includes HTN, SVT, neurogenic bowel, MS (1995), neuropathy, hx of wrist fx, HPV, Hypercholesteremia, adiposity, osteopenia, spasticity. She wears bilateral AFOs and uses a RW and/or wheelchair for mobility. She drives an adapted car.    Limitations Lifting;Standing;Walking;House hold activities    How long can you sit comfortably? n/a    How long can you stand comfortably? 4 minutes    How long can you walk comfortably? walked 50 ft in inpatient rehab    Patient Stated Goals return to PLOF, increase walking, get into shower, increase mobility    Currently in Pain? No/denies                    Treatment:   Ambulate 90 ft with RW and wheelchair follow with CGA, cues for foot clearance and sequencing. Furthest ambulation to date for patient.  Standing with RW : modified squat: knee flex; 3x    Tandem stance static hold 10 seconds x 3 trials each side   seated  Seated hamstring stretch with LE propped onto therapists leg for optimal positioning 60 seconds each LE x 3 trials each LE    Weighted ball (3000 gr) -overhead press 10x -diagonal D2 pattern 8x each direction  -weighted ball toss 15x -circles 8x clockwise 8x counterclockwise  3lb dumbbell -seated fly 10x -cross body punches 10x each UE -tricep extension 12x each UE   isometric contraction against PT resistance: -hip flexion 10x 3 second hold -hip abduction 10x 3 second hold      Patient utilizes ice pack for reduction  of internal temperature to reduce risk of MS exacerbation and allow continuation of progression of interventions.                   PT Education - 11/18/19 1223    Education provided Yes    Education Details exercise technique, body mechanics    Person(s) Educated Patient    Methods Explanation;Demonstration;Tactile cues;Verbal cues    Comprehension Verbalized understanding;Returned demonstration;Verbal cues required;Tactile cues required            PT Short Term Goals - 10/28/19 1207      PT SHORT TERM GOAL #1   Title Patient will be independent in home exercise program to improve strength/mobility for better functional independence with ADLs.    Baseline 5/19: HEP next session 6/22: HEP compliant    Time 4    Period Weeks    Status Partially Met    Target Date 10/22/19      PT SHORT TERM GOAL #2   Title Patient will perform a sit to stand with SUE support for increased ind. with transfers.    Baseline 5/19: heavy BUE support 6/22: requires one hand on walker one hand on w/c    Time 4    Period Weeks    Status On-going    Target Date 10/22/19             PT Long Term Goals - 10/28/19 0001      PT LONG TERM GOAL #1   Title Patient will increase FOTO score to equal to or greater than  53/100   to demonstrate statistically significant improvement in mobility and quality of life.     Baseline 6/22: 46.99% 5/18: 48/100    Time 12    Period Weeks    Status On-going    Target Date 12/16/19      PT LONG TERM GOAL #2   Title Patient (< 48 years old) will complete five times sit to stand test in < 20 seconds indicating an increased LE strength and improved balance.    Baseline 6/22: 40. 86 sec; unable to reach full stand last two attempts 5/19: 42 seconds     Time 12    Period Weeks    Status Partially Met    Target Date 12/16/19      PT LONG TERM GOAL #3   Title Patient will increase 10 meter walk test to <20 seconds as to improve gait speed for better  community ambulation and to reduce fall risk.    Baseline 6/22: 36.1 seconds 5/19: 42 seconds    Time 12    Period Weeks    Status Partially Met    Target Date 12/16/19      PT LONG TERM GOAL #4   Title Patient  will reduce timed up and go to <11 seconds to reduce fall risk and demonstrate improved transfer/gait ability.    Baseline 6/22: 45.4 seconds 5/19: 47 seconds    Time 12    Period Weeks    Status Partially Met    Target Date 12/16/19                 Plan - 11/18/19 1234    Clinical Impression Statement Patient continues to progress with functional capacity for mobility with increased tolerance for task performance. Stability with mobility continues to be an area for improvement.  The patient would benefit from further skilled PT intervention to maximize QOL, safety, and functional mobility    Personal Factors and Comorbidities Age;Comorbidity 3+;Fitness;Past/Current Experience;Sex;Time since onset of injury/illness/exacerbation    Comorbidities HTN, SVT, neurogenic bowel, MS (1995), neuropathy, hx of wrist fx, HPV, Hypercholesteremia, adiposity, osteopenia, spasticity.    Examination-Activity Limitations Bathing;Bed Mobility;Bend;Carry;Continence;Dressing;Hygiene/Grooming;Stairs;Squat;Reach Overhead;Locomotion Level;Lift;Stand;Transfers    Examination-Participation Restrictions Church;Cleaning;Community Activity;Laundry;Volunteer;Shop;Meal Prep;Yard Work    [employer] officer Evolving/Moderate complexity    Rehab Potential Good    PT Frequency 2x / week    PT Duration 12 weeks    PT Treatment/Interventions ADLs/Self Care Home Management;Aquatic Therapy;Biofeedback;Cryotherapy;Electrical Stimulation;Iontophoresis 18m/ml Dexamethasone;Traction;Moist Heat;Ultrasound;DME Instruction;Gait training;Stair training;Functional mobility training;Therapeutic activities;Therapeutic exercise;Balance training;Neuromuscular re-education;Patient/family education;Orthotic  Fit/Training;Wheelchair mobility training;Manual techniques;Compression bandaging;Passive range of motion;Dry needling;Energy conservation;Taping;Splinting;Vestibular    PT Next Visit Plan step negotiation, turning directions to the L and R    PT Home Exercise Plan maintain current HEP from HHPT    Consulted and Agree with Plan of Care Patient           Patient will benefit from skilled therapeutic intervention in order to improve the following deficits and impairments:  Abnormal gait, Decreased activity tolerance, Decreased balance, Decreased coordination, Decreased endurance, Decreased mobility, Decreased range of motion, Decreased strength, Difficulty walking, Impaired perceived functional ability, Impaired sensation, Impaired tone, Impaired UE functional use, Improper body mechanics, Postural dysfunction, Cardiopulmonary status limiting activity, Impaired flexibility, Increased muscle spasms  Visit Diagnosis: Multiple sclerosis exacerbation (HCC)  Muscle weakness (generalized)  Other abnormalities of gait and mobility  Unsteadiness on feet     Problem List Patient Active Problem List   Diagnosis Date Noted  . Hypoalbuminemia due to protein-calorie malnutrition ([place]   . Neurogenic bowel   . Neurogenic bladder   . Labile blood pressure   . Neuropathic pain   . Abscess of female pelvis   . SVT (supraventricular tachycardia) ([place]   . Radial styloid tenosynovitis 03/12/2018  . Wheelchair confinement 02/27/2018  . Localized osteoporosis with current pathological fracture with routine healing 01/19/2017  . Wrist fracture 01/16/2017  . Sprain of ankle 03/23/2016  . Closed fracture of lateral malleolus 03/16/2016  . Health care maintenance 01/24/2016  . Blood pressure elevated without history of HTN 10/25/2015  . Essential hypertension 10/25/2015  . Multiple sclerosis ([place] 10/02/2015  . Chronic left shoulder pain 07/19/2015  . Multiple sclerosis exacerbation ([place] 07/14/2015   . MS (multiple sclerosis) ([place] 11/26/2014  . Increased body mass index 11/26/2014  . HPV test positive 11/26/2014  . Status post laparoscopic supracervical hysterectomy 11/26/2014  . Galactorrhea 11/26/2014  . Back ache 05/21/2014  . Adiposity 05/21/2014  . Disordered sleep 05/21/2014  . Muscle spasticity 05/21/2014  . Spasticity 05/21/2014  . Calculus of kidney 12/09/2013  . Renal colic 064/38/3818 . Hypercholesteremia 08/19/2013  . Hereditary and idiopathic neuropathy 08/19/2013  . Hypercholesterolemia without hypertriglyceridemia 08/19/2013  . Bladder infection, chronic  07/25/2012  . Disorder of bladder function 07/25/2012  . Incomplete bladder emptying 07/25/2012  . Microscopic hematuria 07/25/2012  . Right upper quadrant pain 07/25/2012   Janna Arch, PT, DPT   11/18/2019, 12:36 PM  [place] MAIN [facility] SERVICES [address] [place], [place], [address] Phone: [phone]   Fax:  [phone]  Name: Tashiana Lamarca Maclin MRN: [id number] Date of Birth: [birth year]

## 2019-11-20 ENCOUNTER — Ambulatory Visit: Payer: PPO

## 2019-11-20 ENCOUNTER — Other Ambulatory Visit: Payer: Self-pay

## 2019-11-20 DIAGNOSIS — R2689 Other abnormalities of gait and mobility: Secondary | ICD-10-CM

## 2019-11-20 DIAGNOSIS — R2681 Unsteadiness on feet: Secondary | ICD-10-CM

## 2019-11-20 DIAGNOSIS — G35 Multiple sclerosis: Secondary | ICD-10-CM

## 2019-11-20 DIAGNOSIS — M6281 Muscle weakness (generalized): Secondary | ICD-10-CM

## 2019-11-20 NOTE — Therapy (Signed)
[place] MAIN [facility] SERVICES [address] [place], [place], [address] Phone: [phone]   Fax:  [phone]  Physical Therapy Treatment  Patient Details  Name: Lisa Williams MRN: [id number] Date of Birth: 12-05-1960 Referring Provider (PT): Gurney Maxin, MD   Encounter Date: 11/20/2019   PT End of Session - 11/20/19 1126    Visit Number 17    Number of Visits 24    Date for PT Re-Evaluation 12/16/19    Authorization Type 7/10 PN 6/22    PT Start Time 1015    PT Stop Time 1059    PT Time Calculation (min) 44 min    Equipment Utilized During Treatment Gait belt;Other (comment)   bilateral AFOs   Activity Tolerance Patient tolerated treatment well    Behavior During Therapy [facility] for tasks assessed/performed           Past Medical History:  Diagnosis Date   Abdominal pain, right upper quadrant    Back pain    Calculus of kidney 12/09/2013   Chronic back pain    unspecified   Chronic left shoulder pain 07/19/2015   Functional disorder of bladder    other   Galactorrhea 11/26/2014   Chronic    Hereditary and idiopathic neuropathy 08/19/2013   HPV test positive    Hypercholesteremia 08/19/2013   Incomplete bladder emptying    Microscopic hematuria    MS (multiple sclerosis) (HCC)    Muscle spasticity 05/21/2014   Nonspecific findings on examination of urine    other   Osteopenia    Status post laparoscopic supracervical hysterectomy 11/26/2014   Tobacco user 11/26/2014   Wrist fracture     Past Surgical History:  Procedure Laterality Date   bilateral tubal ligation  1996   BREAST CYST EXCISION Left 2002   KNEE SURGERY     right   LAPAROSCOPIC SUPRACERVICAL HYSTERECTOMY  08/05/2013   ORIF WRIST FRACTURE Left 01/17/2017   Procedure: OPEN REDUCTION INTERNAL FIXATION (ORIF) WRIST FRACTURE;  Surgeon: Lovell Sheehan, MD;  Location: [facility] ORS;  Service: Orthopedics;  Laterality: Left;   TUBAL  LIGATION Bilateral    VAGINAL HYSTERECTOMY  03/2006    There were no vitals filed for this visit.   Subjective Assessment - 11/20/19 1124    Subjective Patient reports having spasms of her R side this morning. No falls or LOB since last session.    Pertinent History Patient is previous patient of this therapist, was admitted to the hospital on 08/03/19 for MS exacerbation and then went to inpatient rehab on 08/08/19. Patient then received home health therapy and now is returning to outpatient PT. PMh includes HTN, SVT, neurogenic bowel, MS (1995), neuropathy, hx of wrist fx, HPV, Hypercholesteremia, adiposity, osteopenia, spasticity. She wears bilateral AFOs and uses a RW and/or wheelchair for mobility. She drives an adapted car.    Limitations Lifting;Standing;Walking;House hold activities    How long can you sit comfortably? n/a    How long can you stand comfortably? 4 minutes    How long can you walk comfortably? walked 50 ft in inpatient rehab    Patient Stated Goals return to PLOF, increase walking, get into shower, increase mobility    Currently in Pain? Yes    Pain Score 6     Pain Location Flank    Pain Orientation Right    Pain Descriptors / Indicators Spasm    Pain Type Acute pain    Pain Onset Today  Pain Frequency Intermittent                Treatment:   In // bars:  Standing weight shifts with focus on lateral movement with LE's rather than UE's  airex pad in front of patient for visual cue: focus on weight shift, hp flexion and knee flex to attempt to lift leg onto pad, able to scuff top twice with RLE, unable with LLE however shows improved segmentation of hip and knee x 8 each LE  Standing without support:modified raise of PVC pipe 10x; 2 sets, seated rest break between, challenging to core stability  Seated: R scapular retraction and depression with PT overpressure x 15 for 5 second holds  STM to lower thoracic/upper lumbar lateral paraspinal and accessory  musculature for spasm relief x 4 minutes with implementation of effleurage and ptrissage. Patient reports no spasms by end of manual.   lateral lean to pick up ball outside BOS, return to COM, then toss ball into hoop for coordination, stability, and pertubation to core. X 15   Upright posture without back support, clap in patterns with PT for core stability and capacity for stabilization against pertubation's with dual task x 75 seconds     Patient utilizes ice pack for reduction of internal temperature to reduce risk of MS exacerbation and allow continuation of progression of interventions.     Pt educated throughout session about proper posture and technique with exercises. Improved exercise technique, movement at target joints, use of target muscles after min to mod verbal, visual, tactile cues.      Patient demonstrates improved tolerance of stability within COM with increasing range to perimeter of range of stability in both standing and seated. Patient is progressing with UE movements in standing without UE support.  Patient initially limited due to right flank spasms that were reduced with manual to no pain allowing for continuation of session. The patient would benefit from further skilled PT intervention to maximize QOL, safety, and functional mobility       PT Education - 11/20/19 1125    Education provided Yes    Education Details exercise technique, body mechanics    Person(s) Educated Patient    Methods Explanation;Demonstration;Tactile cues;Verbal cues;Handout    Comprehension Verbalized understanding;Returned demonstration;Verbal cues required;Tactile cues required            PT Short Term Goals - 10/28/19 1207      PT SHORT TERM GOAL #1   Title Patient will be independent in home exercise program to improve strength/mobility for better functional independence with ADLs.    Baseline 5/19: HEP next session 6/22: HEP compliant    Time 4    Period Weeks    Status  Partially Met    Target Date 10/22/19      PT SHORT TERM GOAL #2   Title Patient will perform a sit to stand with SUE support for increased ind. with transfers.    Baseline 5/19: heavy BUE support 6/22: requires one hand on walker one hand on w/c    Time 4    Period Weeks    Status On-going    Target Date 10/22/19             PT Long Term Goals - 10/28/19 0001      PT LONG TERM GOAL #1   Title Patient will increase FOTO score to equal to or greater than  53/100   to demonstrate statistically significant improvement in mobility and quality of life.  Baseline 6/22: 46.99% 5/18: 48/100    Time 12    Period Weeks    Status On-going    Target Date 12/16/19      PT LONG TERM GOAL #2   Title Patient (< 81 years old) will complete five times sit to stand test in < 20 seconds indicating an increased LE strength and improved balance.    Baseline 6/22: 40. 86 sec; unable to reach full stand last two attempts 5/19: 42 seconds     Time 12    Period Weeks    Status Partially Met    Target Date 12/16/19      PT LONG TERM GOAL #3   Title Patient will increase 10 meter walk test to <20 seconds as to improve gait speed for better community ambulation and to reduce fall risk.    Baseline 6/22: 36.1 seconds 5/19: 42 seconds    Time 12    Period Weeks    Status Partially Met    Target Date 12/16/19      PT LONG TERM GOAL #4   Title Patient will reduce timed up and go to <11 seconds to reduce fall risk and demonstrate improved transfer/gait ability.    Baseline 6/22: 45.4 seconds 5/19: 47 seconds    Time 12    Period Weeks    Status Partially Met    Target Date 12/16/19                 Plan - 11/20/19 1129    Clinical Impression Statement Patient demonstrates improved tolerance of stability within COM with increasing range to perimeter of range of stability in both standing and seated. Patient is progressing with UE movements in standing without UE support.  Patient initially  limited due to right flank spasms that were reduced with manual to no pain allowing for continuation of session. The patient would benefit from further skilled PT intervention to maximize QOL, safety, and functional mobility    Personal Factors and Comorbidities Age;Comorbidity 3+;Fitness;Past/Current Experience;Sex;Time since onset of injury/illness/exacerbation    Comorbidities HTN, SVT, neurogenic bowel, MS (1995), neuropathy, hx of wrist fx, HPV, Hypercholesteremia, adiposity, osteopenia, spasticity.    Examination-Activity Limitations Bathing;Bed Mobility;Bend;Carry;Continence;Dressing;Hygiene/Grooming;Stairs;Squat;Reach Overhead;Locomotion Level;Lift;Stand;Transfers    Examination-Participation Restrictions Church;Cleaning;Community Activity;Laundry;Volunteer;Shop;Meal Prep;Yard Work    [employer] officer Evolving/Moderate complexity    Rehab Potential Good    PT Frequency 2x / week    PT Duration 12 weeks    PT Treatment/Interventions ADLs/Self Care Home Management;Aquatic Therapy;Biofeedback;Cryotherapy;Electrical Stimulation;Iontophoresis 27m/ml Dexamethasone;Traction;Moist Heat;Ultrasound;DME Instruction;Gait training;Stair training;Functional mobility training;Therapeutic activities;Therapeutic exercise;Balance training;Neuromuscular re-education;Patient/family education;Orthotic Fit/Training;Wheelchair mobility training;Manual techniques;Compression bandaging;Passive range of motion;Dry needling;Energy conservation;Taping;Splinting;Vestibular    PT Next Visit Plan step negotiation, turning directions to the L and R    PT Home Exercise Plan maintain current HEP from HHPT    Consulted and Agree with Plan of Care Patient           Patient will benefit from skilled therapeutic intervention in order to improve the following deficits and impairments:  Abnormal gait, Decreased activity tolerance, Decreased balance, Decreased coordination, Decreased endurance, Decreased  mobility, Decreased range of motion, Decreased strength, Difficulty walking, Impaired perceived functional ability, Impaired sensation, Impaired tone, Impaired UE functional use, Improper body mechanics, Postural dysfunction, Cardiopulmonary status limiting activity, Impaired flexibility, Increased muscle spasms  Visit Diagnosis: Multiple sclerosis exacerbation (HCC)  Muscle weakness (generalized)  Other abnormalities of gait and mobility  Unsteadiness on feet     Problem List Patient Active Problem List  Diagnosis Date Noted   Hypoalbuminemia due to protein-calorie malnutrition (HCC)    Neurogenic bowel    Neurogenic bladder    Labile blood pressure    Neuropathic pain    Abscess of female pelvis    SVT (supraventricular tachycardia) (HCC)    Radial styloid tenosynovitis 03/12/2018   Wheelchair confinement 02/27/2018   Localized osteoporosis with current pathological fracture with routine healing 01/19/2017   Wrist fracture 01/16/2017   Sprain of ankle 03/23/2016   Closed fracture of lateral malleolus 03/16/2016   Health care maintenance 01/24/2016   Blood pressure elevated without history of HTN 10/25/2015   Essential hypertension 10/25/2015   Multiple sclerosis ([place]) 10/02/2015   Chronic left shoulder pain 07/19/2015   Multiple sclerosis exacerbation ([place]) 07/14/2015   MS (multiple sclerosis) ([place]) 11/26/2014   Increased body mass index 11/26/2014   HPV test positive 11/26/2014   Status post laparoscopic supracervical hysterectomy 11/26/2014   Galactorrhea 11/26/2014   Back ache 05/21/2014   Adiposity 05/21/2014   Disordered sleep 05/21/2014   Muscle spasticity 05/21/2014   Spasticity 05/21/2014   Calculus of kidney 02/58/5277   Renal colic 82/42/3536   Hypercholesteremia 08/19/2013   Hereditary and idiopathic neuropathy 08/19/2013   Hypercholesterolemia without hypertriglyceridemia 08/19/2013   Bladder infection, chronic  07/25/2012   Disorder of bladder function 07/25/2012   Incomplete bladder emptying 07/25/2012   Microscopic hematuria 07/25/2012   Right upper quadrant pain 07/25/2012   Janna Arch, PT, DPT   11/20/2019, 11:30 AM  [place] MAIN [facility] SERVICES [address] [place], [place], [address] Phone: [phone]   Fax:  [phone]  Name: Lisa Williams MRN: [id number] Date of Birth: 07/13/1960

## 2019-11-25 ENCOUNTER — Other Ambulatory Visit: Payer: Self-pay

## 2019-11-25 ENCOUNTER — Ambulatory Visit: Payer: PPO

## 2019-11-25 DIAGNOSIS — G35 Multiple sclerosis: Secondary | ICD-10-CM

## 2019-11-25 DIAGNOSIS — R2689 Other abnormalities of gait and mobility: Secondary | ICD-10-CM

## 2019-11-25 DIAGNOSIS — R2681 Unsteadiness on feet: Secondary | ICD-10-CM

## 2019-11-25 DIAGNOSIS — M6281 Muscle weakness (generalized): Secondary | ICD-10-CM

## 2019-11-25 NOTE — Therapy (Signed)
[place] MAIN [facility] SERVICES [address] [place], [place], [address] Phone: [phone]   Fax:  [phone]  Physical Therapy Treatment  Patient Details  Name: Lisa Williams MRN: [id number] Date of Birth: May 31, 1960 Referring Provider (PT): Gurney Maxin, MD   Encounter Date: 11/25/2019   PT End of Session - 11/25/19 1312    Visit Number 18    Number of Visits 24    Date for PT Re-Evaluation 12/16/19    Authorization Type 8/10 PN 6/22    PT Start Time 1015    PT Stop Time 1059    PT Time Calculation (min) 44 min    Equipment Utilized During Treatment Gait belt;Other (comment)   bilateral AFOs   Activity Tolerance Patient tolerated treatment well    Behavior During Therapy [facility] for tasks assessed/performed           Past Medical History:  Diagnosis Date   Abdominal pain, right upper quadrant    Back pain    Calculus of kidney 12/09/2013   Chronic back pain    unspecified   Chronic left shoulder pain 07/19/2015   Functional disorder of bladder    other   Galactorrhea 11/26/2014   Chronic    Hereditary and idiopathic neuropathy 08/19/2013   HPV test positive    Hypercholesteremia 08/19/2013   Incomplete bladder emptying    Microscopic hematuria    MS (multiple sclerosis) (HCC)    Muscle spasticity 05/21/2014   Nonspecific findings on examination of urine    other   Osteopenia    Status post laparoscopic supracervical hysterectomy 11/26/2014   Tobacco user 11/26/2014   Wrist fracture     Past Surgical History:  Procedure Laterality Date   bilateral tubal ligation  1996   BREAST CYST EXCISION Left 2002   KNEE SURGERY     right   LAPAROSCOPIC SUPRACERVICAL HYSTERECTOMY  08/05/2013   ORIF WRIST FRACTURE Left 01/17/2017   Procedure: OPEN REDUCTION INTERNAL FIXATION (ORIF) WRIST FRACTURE;  Surgeon: Lovell Sheehan, MD;  Location: [facility] ORS;  Service: Orthopedics;  Laterality: Left;   TUBAL  LIGATION Bilateral    VAGINAL HYSTERECTOMY  03/2006    There were no vitals filed for this visit.   Subjective Assessment - 11/25/19 1032    Subjective Patient reports no falls or LOB since last session. Compliance with with HEP. no complaints or concerns at this time.    Pertinent History Patient is previous patient of this therapist, was admitted to the hospital on 08/03/19 for MS exacerbation and then went to inpatient rehab on 08/08/19. Patient then received home health therapy and now is returning to outpatient PT. PMh includes HTN, SVT, neurogenic bowel, MS (1995), neuropathy, hx of wrist fx, HPV, Hypercholesteremia, adiposity, osteopenia, spasticity. She wears bilateral AFOs and uses a RW and/or wheelchair for mobility. She drives an adapted car.    Limitations Lifting;Standing;Walking;House hold activities    How long can you sit comfortably? n/a    How long can you stand comfortably? 4 minutes    How long can you walk comfortably? walked 50 ft in inpatient rehab    Patient Stated Goals return to PLOF, increase walking, get into shower, increase mobility    Currently in Pain? No/denies               standing in // bars; contact guard with focus on core activation and stability  -static stand playing tic tac toe for pertubations and reach -forward ambulation  length of // bars, backwards ambulation back ; rest break after each one x 3 trials  -tandem stance decrease to no UE support 5x 15 second holds each LE  Seated: cues for body mechanics and sequencing for optimal muscle recruitment:   Green swiss ball  -TrA activation 10x 3 second holds -straight arm raise 10x 3 second hold -arc from opp LE to other LE with semicircle phase 10x each direction -cross body TrA activation single LE single UE press 10x each side  Patient utilizes ice pack for reduction of internal temperature to reduce risk of MS exacerbation and allow continuation of progression of interventions.     Pt  educated throughout session about proper posture and technique with exercises. Improved exercise technique, movement at target joints, use of target muscles after min to mod verbal, visual, tactile cues.                      PT Education - 11/25/19 1312    Education provided Yes    Education Details exercise technique, body mechanics    Person(s) Educated Patient    Methods Explanation;Demonstration;Tactile cues;Verbal cues    Comprehension Verbalized understanding;Returned demonstration;Verbal cues required;Tactile cues required            PT Short Term Goals - 10/28/19 1207      PT SHORT TERM GOAL #1   Title Patient will be independent in home exercise program to improve strength/mobility for better functional independence with ADLs.    Baseline 5/19: HEP next session 6/22: HEP compliant    Time 4    Period Weeks    Status Partially Met    Target Date 10/22/19      PT SHORT TERM GOAL #2   Title Patient will perform a sit to stand with SUE support for increased ind. with transfers.    Baseline 5/19: heavy BUE support 6/22: requires one hand on walker one hand on w/c    Time 4    Period Weeks    Status On-going    Target Date 10/22/19             PT Long Term Goals - 10/28/19 0001      PT LONG TERM GOAL #1   Title Patient will increase FOTO score to equal to or greater than  53/100   to demonstrate statistically significant improvement in mobility and quality of life.     Baseline 6/22: 46.99% 5/18: 48/100    Time 12    Period Weeks    Status On-going    Target Date 12/16/19      PT LONG TERM GOAL #2   Title Patient (< 20 years old) will complete five times sit to stand test in < 20 seconds indicating an increased LE strength and improved balance.    Baseline 6/22: 40. 86 sec; unable to reach full stand last two attempts 5/19: 42 seconds     Time 12    Period Weeks    Status Partially Met    Target Date 12/16/19      PT LONG TERM GOAL #3    Title Patient will increase 10 meter walk test to <20 seconds as to improve gait speed for better community ambulation and to reduce fall risk.    Baseline 6/22: 36.1 seconds 5/19: 42 seconds    Time 12    Period Weeks    Status Partially Met    Target Date 12/16/19  PT LONG TERM GOAL #4   Title Patient will reduce timed up and go to <11 seconds to reduce fall risk and demonstrate improved transfer/gait ability.    Baseline 6/22: 45.4 seconds 5/19: 47 seconds    Time 12    Period Weeks    Status Partially Met    Target Date 12/16/19                 Plan - 11/25/19 1313    Clinical Impression Statement Patient presents with excellent motivation throughout session despite frequent episodes of "heating up" requiring use of ice pack for reduction of temperatures. Core stability for obtaining semi neutral position in standing to increase balance/stability without UE support tolerated well. The patient would benefit from further skilled PT intervention to maximize QOL, safety, and functional mobility    Personal Factors and Comorbidities Age;Comorbidity 3+;Fitness;Past/Current Experience;Sex;Time since onset of injury/illness/exacerbation    Comorbidities HTN, SVT, neurogenic bowel, MS (1995), neuropathy, hx of wrist fx, HPV, Hypercholesteremia, adiposity, osteopenia, spasticity.    Examination-Activity Limitations Bathing;Bed Mobility;Bend;Carry;Continence;Dressing;Hygiene/Grooming;Stairs;Squat;Reach Overhead;Locomotion Level;Lift;Stand;Transfers    Examination-Participation Restrictions Church;Cleaning;Community Activity;Laundry;Volunteer;Shop;Meal Prep;Yard Work    [employer]chant [employer] officer Evolving/Moderate complexity    Rehab Potential Good    PT Frequency 2x / week    PT Duration 12 weeks    PT Treatment/Interventions ADLs/Self Care Home Management;Aquatic Therapy;Biofeedback;Cryotherapy;Electrical Stimulation;Iontophoresis 40m/ml Dexamethasone;Traction;Moist  Heat;Ultrasound;DME Instruction;Gait training;Stair training;Functional mobility training;Therapeutic activities;Therapeutic exercise;Balance training;Neuromuscular re-education;Patient/family education;Orthotic Fit/Training;Wheelchair mobility training;Manual techniques;Compression bandaging;Passive range of motion;Dry needling;Energy conservation;Taping;Splinting;Vestibular    PT Next Visit Plan step negotiation, turning directions to the L and R    PT Home Exercise Plan maintain current HEP from [facility] Agree with Plan of Care Patient           Patient will benefit from skilled therapeutic intervention in order to improve the following deficits and impairments:  Abnormal gait, Decreased activity tolerance, Decreased balance, Decreased coordination, Decreased endurance, Decreased mobility, Decreased range of motion, Decreased strength, Difficulty walking, Impaired perceived functional ability, Impaired sensation, Impaired tone, Impaired UE functional use, Improper body mechanics, Postural dysfunction, Cardiopulmonary status limiting activity, Impaired flexibility, Increased muscle spasms  Visit Diagnosis: Multiple sclerosis exacerbation (HCC)  Muscle weakness (generalized)  Other abnormalities of gait and mobility  Unsteadiness on feet     Problem List Patient Active Problem List   Diagnosis Date Noted   Hypoalbuminemia due to protein-calorie malnutrition ([facility]    Neurogenic bowel    Neurogenic bladder    Labile blood pressure    Neuropathic pain    Abscess of female pelvis    SVT (supraventricular tachycardia) (HCC)    Radial styloid tenosynovitis 03/12/2018   Wheelchair confinement 02/27/2018   Localized osteoporosis with current pathological fracture with routine healing 01/19/2017   Wrist fracture 01/16/2017   Sprain of ankle 03/23/2016   Closed fracture of lateral malleolus 03/16/2016   Health care maintenance 01/24/2016   Blood pressure  elevated without history of HTN 10/25/2015   Essential hypertension 10/25/2015   Multiple sclerosis ([facility] 10/02/2015   Chronic left shoulder pain 07/19/2015   Multiple sclerosis exacerbation ([facility] 07/14/2015   MS (multiple sclerosis) ([facility] 11/26/2014   Increased body mass index 11/26/2014   HPV test positive 11/26/2014   Status post laparoscopic supracervical hysterectomy 11/26/2014   Galactorrhea 11/26/2014   Back ache 05/21/2014   Adiposity 05/21/2014   Disordered sleep 05/21/2014   Muscle spasticity 05/21/2014   Spasticity 05/21/2014   Calculus of kidney 091/47/8295  Renal colic 062/13/0865  Hypercholesteremia 08/19/2013   Hereditary and idiopathic neuropathy 08/19/2013   Hypercholesterolemia without hypertriglyceridemia 08/19/2013   Bladder infection, chronic 07/25/2012   Disorder of bladder function 07/25/2012   Incomplete bladder emptying 07/25/2012   Microscopic hematuria 07/25/2012   Right upper quadrant pain 07/25/2012   Janna Arch, PT, DPT   11/25/2019, 1:14 PM  [place] MAIN [facility] SERVICES [address] [place], [place], [address] Phone: [phone]   Fax:  [phone]  Name: Lisa Williams MRN: [id number] Date of Birth: [birth year]

## 2019-11-27 ENCOUNTER — Other Ambulatory Visit: Payer: Self-pay

## 2019-11-27 ENCOUNTER — Ambulatory Visit: Payer: PPO

## 2019-11-27 DIAGNOSIS — R2689 Other abnormalities of gait and mobility: Secondary | ICD-10-CM

## 2019-11-27 DIAGNOSIS — M6281 Muscle weakness (generalized): Secondary | ICD-10-CM

## 2019-11-27 DIAGNOSIS — R2681 Unsteadiness on feet: Secondary | ICD-10-CM

## 2019-11-27 DIAGNOSIS — G35 Multiple sclerosis: Secondary | ICD-10-CM

## 2019-11-27 NOTE — Therapy (Signed)
[place] [facility] REHAB SERVICES [address] [place], [place], [address] Phone: [phone]   Fax:  [phone]  Physical Therapy Treatment  Patient Details  Name: Lisa Williams MRN: [id number] Date of Birth: 07/13/1960 Referring Provider (PT): Zachary Potter, MD   Encounter Date: 11/27/2019   PT End of Session - 11/28/19 0840    Visit Number 19    Number of Visits 24    Date for PT Re-Evaluation 12/16/19    Authorization Type 9/10 PN 6/22    PT Start Time 1100    PT Stop Time 1144    PT Time Calculation (min) 44 min    Equipment Utilized During Treatment Gait belt;Other (comment)   bilateral AFOs   Activity Tolerance Patient tolerated treatment well    Behavior During Therapy WFL for tasks assessed/performed           Past Medical History:  Diagnosis Date  . Abdominal pain, right upper quadrant   . Back pain   . Calculus of kidney 12/09/2013  . Chronic back pain    unspecified  . Chronic left shoulder pain 07/19/2015  . Functional disorder of bladder    other  . Galactorrhea 11/26/2014   Chronic   . Hereditary and idiopathic neuropathy 08/19/2013  . HPV test positive   . Hypercholesteremia 08/19/2013  . Incomplete bladder emptying   . Microscopic hematuria   . MS (multiple sclerosis) (HCC)   . Muscle spasticity 05/21/2014  . Nonspecific findings on examination of urine    other  . Osteopenia   . Status post laparoscopic supracervical hysterectomy 11/26/2014  . Tobacco user 11/26/2014  . Wrist fracture     Past Surgical History:  Procedure Laterality Date  . bilateral tubal ligation  1996  . BREAST CYST EXCISION Left 2002  . KNEE SURGERY     right  . LAPAROSCOPIC SUPRACERVICAL HYSTERECTOMY  08/05/2013  . ORIF WRIST FRACTURE Left 01/17/2017   Procedure: OPEN REDUCTION INTERNAL FIXATION (ORIF) WRIST FRACTURE;  Surgeon: Bowers, James R, MD;  Location: [facility] ORS;  Service: Orthopedics;  Laterality: Left;  . TUBAL  LIGATION Bilateral   . VAGINAL HYSTERECTOMY  03/2006    There were no vitals filed for this visit.   Subjective Assessment - 11/28/19 0838    Subjective Patient reports she is feeling good today. Has been compliant with HEP, was able to get up from the salon hair washing station for the first time yesterday.    Pertinent History Patient is previous patient of this therapist, was admitted to the hospital on 08/03/19 for MS exacerbation and then went to inpatient rehab on 08/08/19. Patient then received home health therapy and now is returning to outpatient PT. PMh includes HTN, SVT, neurogenic bowel, MS (1995), neuropathy, hx of wrist fx, HPV, Hypercholesteremia, adiposity, osteopenia, spasticity. She wears bilateral AFOs and uses a RW and/or wheelchair for mobility. She drives an adapted car.    Limitations Lifting;Standing;Walking;House hold activities    How long can you sit comfortably? n/a    How long can you stand comfortably? 4 minutes    How long can you walk comfortably? walked 50 ft in inpatient rehab    Patient Stated Goals return to PLOF, increase walking, get into shower, increase mobility    Currently in Pain? No/denies                 Treatment:   Ambulate 105 ft with RW and wheelchair follow with CGA, cues for foot   clearance and sequencing. Furthest ambulation to date for patient. Sat and rest and then ambulated an additional 13 ft for 118 ft total.   Seated:  Seated bowling with weighted ball for core contraction, pertubation's with momentum and stability x 4 minutes  Seated boxing: cues for sequencing, body mechanics, and pace/rhythm for functional contraction and timing of muscle recruitment: -Cross body punches to mitts on PT hands with no back support for focused core stabilization with crossing midline 2x 60 seconds  -Cross body punch with secondary combination of elbow for full cross body rotation to PT mitt for trunk stability, coordination, and cardiovascular  challenge x 60 seconds -Upper cut to mitts in PT hands for core activation without back support with perturbations 60 seconds     isometric contraction against PT resistance: -hip flexion 10x 3 second hold -hip abduction 10x 3 second hold -hip adduction 10x 3 second holds      Patient utilizes ice pack for reduction of internal temperature to reduce risk of MS exacerbation and allow continuation of progression of interventions.                    PT Education - 11/28/19 0839    Education provided Yes    Education Details exercise technique, body mechanics    Person(s) Educated Patient    Methods Explanation;Demonstration;Tactile cues;Verbal cues    Comprehension Verbalized understanding;Returned demonstration;Verbal cues required;Tactile cues required            PT Short Term Goals - 10/28/19 1207      PT SHORT TERM GOAL #1   Title Patient will be independent in home exercise program to improve strength/mobility for better functional independence with ADLs.    Baseline 5/19: HEP next session 6/22: HEP compliant    Time 4    Period Weeks    Status Partially Met    Target Date 10/22/19      PT SHORT TERM GOAL #2   Title Patient will perform a sit to stand with SUE support for increased ind. with transfers.    Baseline 5/19: heavy BUE support 6/22: requires one hand on walker one hand on w/c    Time 4    Period Weeks    Status On-going    Target Date 10/22/19             PT Long Term Goals - 10/28/19 0001      PT LONG TERM GOAL #1   Title Patient will increase FOTO score to equal to or greater than  53/100   to demonstrate statistically significant improvement in mobility and quality of life.     Baseline 6/22: 46.99% 5/18: 48/100    Time 12    Period Weeks    Status On-going    Target Date 12/16/19      PT LONG TERM GOAL #2   Title Patient (< 8 years old) will complete five times sit to stand test in < 20 seconds indicating an increased LE  strength and improved balance.    Baseline 6/22: 40. 86 sec; unable to reach full stand last two attempts 5/19: 42 seconds     Time 12    Period Weeks    Status Partially Met    Target Date 12/16/19      PT LONG TERM GOAL #3   Title Patient will increase 10 meter walk test to <20 seconds as to improve gait speed for better community ambulation and to reduce fall risk.  Baseline 6/22: 36.1 seconds 5/19: 42 seconds    Time 12    Period Weeks    Status Partially Met    Target Date 12/16/19      PT LONG TERM GOAL #4   Title Patient will reduce timed up and go to <11 seconds to reduce fall risk and demonstrate improved transfer/gait ability.    Baseline 6/22: 45.4 seconds 5/19: 47 seconds    Time 12    Period Weeks    Status Partially Met    Target Date 12/16/19                 Plan - 11/28/19 0840    Clinical Impression Statement .Patient continues to demonstrate excellent motivation, performing furthest ambulation to date this session, then sitting resting and ambulating an additional length. Core stability for obtaining semi neutral position in standing to increase balance/stability without UE support tolerated well. The patient would benefit from further skilled PT intervention to maximize QOL, safety, and functional mobility    Personal Factors and Comorbidities Age;Comorbidity 3+;Fitness;Past/Current Experience;Sex;Time since onset of injury/illness/exacerbation    Comorbidities HTN, SVT, neurogenic bowel, MS (1995), neuropathy, hx of wrist fx, HPV, Hypercholesteremia, adiposity, osteopenia, spasticity.    Examination-Activity Limitations Bathing;Bed Mobility;Bend;Carry;Continence;Dressing;Hygiene/Grooming;Stairs;Squat;Reach Overhead;Locomotion Level;Lift;Stand;Transfers    Examination-Participation Restrictions Church;Cleaning;Community Activity;Laundry;Volunteer;Shop;Meal Prep;Yard Work    [employer]chant [employer] officer Evolving/Moderate complexity    Rehab Potential  Good    PT Frequency 2x / week    PT Duration 12 weeks    PT Treatment/Interventions ADLs/Self Care Home Management;Aquatic Therapy;Biofeedback;Cryotherapy;Electrical Stimulation;Iontophoresis 69m/ml Dexamethasone;Traction;Moist Heat;Ultrasound;DME Instruction;Gait training;Stair training;Functional mobility training;Therapeutic activities;Therapeutic exercise;Balance training;Neuromuscular re-education;Patient/family education;Orthotic Fit/Training;Wheelchair mobility training;Manual techniques;Compression bandaging;Passive range of motion;Dry needling;Energy conservation;Taping;Splinting;Vestibular    PT Next Visit Plan step negotiation, turning directions to the L and R    PT Home Exercise Plan maintain current HEP from [facility]    Consulted and Agree with Plan of Care Patient           Patient will benefit from skilled therapeutic intervention in order to improve the following deficits and impairments:  Abnormal gait, Decreased activity tolerance, Decreased balance, Decreased coordination, Decreased endurance, Decreased mobility, Decreased range of motion, Decreased strength, Difficulty walking, Impaired perceived functional ability, Impaired sensation, Impaired tone, Impaired UE functional use, Improper body mechanics, Postural dysfunction, Cardiopulmonary status limiting activity, Impaired flexibility, Increased muscle spasms  Visit Diagnosis: Multiple sclerosis exacerbation (HCC)  Muscle weakness (generalized)  Other abnormalities of gait and mobility  Unsteadiness on feet     Problem List Patient Active Problem List   Diagnosis Date Noted  . Hypoalbuminemia due to protein-calorie malnutrition ([facility]   . Neurogenic bowel   . Neurogenic bladder   . Labile blood pressure   . Neuropathic pain   . Abscess of female pelvis   . SVT (supraventricular tachycardia) ([facility]   . Radial styloid tenosynovitis 03/12/2018  . Wheelchair confinement 02/27/2018  . Localized osteoporosis with  current pathological fracture with routine healing 01/19/2017  . Wrist fracture 01/16/2017  . Sprain of ankle 03/23/2016  . Closed fracture of lateral malleolus 03/16/2016  . Health care maintenance 01/24/2016  . Blood pressure elevated without history of HTN 10/25/2015  . Essential hypertension 10/25/2015  . Multiple sclerosis ([facility] 10/02/2015  . Chronic left shoulder pain 07/19/2015  . Multiple sclerosis exacerbation ([facility] 07/14/2015  . MS (multiple sclerosis) ([facility] 11/26/2014  . Increased body mass index 11/26/2014  . HPV test positive 11/26/2014  . Status post laparoscopic supracervical hysterectomy 11/26/2014  . Galactorrhea 11/26/2014  .  Back ache 05/21/2014  . Adiposity 05/21/2014  . Disordered sleep 05/21/2014  . Muscle spasticity 05/21/2014  . Spasticity 05/21/2014  . Calculus of kidney 12/09/2013  . Renal colic 12/09/2013  . Hypercholesteremia 08/19/2013  . Hereditary and idiopathic neuropathy 08/19/2013  . Hypercholesterolemia without hypertriglyceridemia 08/19/2013  . Bladder infection, chronic 07/25/2012  . Disorder of bladder function 07/25/2012  . Incomplete bladder emptying 07/25/2012  . Microscopic hematuria 07/25/2012  . Right upper quadrant pain 07/25/2012    , PT, DPT   11/28/2019, 8:43 AM  [place] [facility] MA[facility] REHAB SERVICES [address] [place], [place], [address] Phone: [phone]   Fax:  [phone]  Name: Lisa Williams MRN: [id number] Date of Birth: 01/02/1961   

## 2019-12-02 ENCOUNTER — Ambulatory Visit: Payer: PPO

## 2019-12-02 ENCOUNTER — Other Ambulatory Visit: Payer: Self-pay

## 2019-12-02 DIAGNOSIS — G35 Multiple sclerosis: Secondary | ICD-10-CM

## 2019-12-02 DIAGNOSIS — R2681 Unsteadiness on feet: Secondary | ICD-10-CM

## 2019-12-02 DIAGNOSIS — G35D Multiple sclerosis, unspecified: Secondary | ICD-10-CM

## 2019-12-02 DIAGNOSIS — R2689 Other abnormalities of gait and mobility: Secondary | ICD-10-CM

## 2019-12-02 DIAGNOSIS — M6281 Muscle weakness (generalized): Secondary | ICD-10-CM

## 2019-12-02 NOTE — Therapy (Signed)
[place] MAIN [facility] SERVICES [address] [place], [place], [address] Phone: [phone]   Fax:  [phone]  Physical Therapy Treatment Physical Therapy Progress Note   Dates of reporting period  10/28/19   to   12/02/19  Patient Details  Name: Lisa Williams MRN: [id number] Date of Birth: January 15, 1961 Referring Provider (PT): Gurney Maxin, MD   Encounter Date: 12/02/2019   PT End of Session - 12/02/19 1722    Visit Number 20    Number of Visits 24    Date for PT Re-Evaluation 12/16/19    Authorization Type 10/10 PN 6/22; next session 1/10 PN 7/27    PT Start Time 1015    PT Stop Time 1059    PT Time Calculation (min) 44 min    Equipment Utilized During Treatment Gait belt;Other (comment)   bilateral AFOs   Activity Tolerance Patient tolerated treatment well    Behavior During Therapy WFL for tasks assessed/performed           Past Medical History:  Diagnosis Date  . Abdominal pain, right upper quadrant   . Back pain   . Calculus of kidney 12/09/2013  . Chronic back pain    unspecified  . Chronic left shoulder pain 07/19/2015  . Functional disorder of bladder    other  . Galactorrhea 11/26/2014   Chronic   . Hereditary and idiopathic neuropathy 08/19/2013  . HPV test positive   . Hypercholesteremia 08/19/2013  . Incomplete bladder emptying   . Microscopic hematuria   . MS (multiple sclerosis) ([place])   . Muscle spasticity 05/21/2014  . Nonspecific findings on examination of urine    other  . Osteopenia   . Status post laparoscopic supracervical hysterectomy 11/26/2014  . Tobacco user 11/26/2014  . Wrist fracture     Past Surgical History:  Procedure Laterality Date  . bilateral tubal ligation  1996  . BREAST CYST EXCISION Left 2002  . KNEE SURGERY     right  . LAPAROSCOPIC SUPRACERVICAL HYSTERECTOMY  08/05/2013  . ORIF WRIST FRACTURE Left 01/17/2017   Procedure: OPEN REDUCTION INTERNAL FIXATION (ORIF) WRIST  FRACTURE;  Surgeon: Lovell Sheehan, MD;  Location: [facility] ORS;  Service: Orthopedics;  Laterality: Left;  . TUBAL LIGATION Bilateral   . VAGINAL HYSTERECTOMY  03/2006    There were no vitals filed for this visit.   Subjective Assessment - 12/02/19 1718    Subjective Patient reports she has been having to have conversations with her neurologist due to them "messing" up her documentation and now has to get a DMV test despite using hand controls since 2008 .    Pertinent History Patient is previous patient of this therapist, was admitted to the hospital on 08/03/19 for MS exacerbation and then went to inpatient rehab on 08/08/19. Patient then received home health therapy and now is returning to outpatient PT. PMh includes HTN, SVT, neurogenic bowel, MS (1995), neuropathy, hx of wrist fx, HPV, Hypercholesteremia, adiposity, osteopenia, spasticity. She wears bilateral AFOs and uses a RW and/or wheelchair for mobility. She drives an adapted car.    Limitations Lifting;Standing;Walking;House hold activities    How long can you sit comfortably? n/a    How long can you stand comfortably? 4 minutes    How long can you walk comfortably? walked 50 ft in inpatient rehab    Patient Stated Goals return to PLOF, increase walking, get into shower, increase mobility    Currently in Pain? No/denies  Goals:  FOTO: 48.1  5x STS: 26.8 seconds with one hand on walker one hand on RW, one partial stand  10 MWT : 36.01 TUG: 43.65 seconds with RW   Treatment: isometric contraction against PT resistance: -hip flexion 10x 3 second hold -hip abduction 10x 3 second hold -hip adduction 10x 3 second hold  Seated shadow boxing: cues for sequencing, body mechanics, and pace/rhythm for functional contraction and timing of muscle recruitment:  Patient utilizes ice pack for reduction of internal temperature to reduce risk of MS exacerbation and allow continuation of progression of interventions.    Pt educated throughout session about proper posture and technique with exercises. Improved exercise technique, movement at target joints, use of target muscles after min to mod verbal, visual, tactile cues.  Patient's condition has the potential to improve in response to therapy. Maximum improvement is yet to be obtained. The anticipated improvement is attainable and reasonable in a generally predictable time.  Patient reports she is able to do more at home with less support, is able to transfer easier and walk more at home.                  PT Education - 12/02/19 1720    Education provided Yes    Education Details exercise technique, body mechanics    Person(s) Educated Patient    Methods Explanation;Demonstration;Tactile cues;Verbal cues    Comprehension Verbalized understanding;Returned demonstration;Verbal cues required;Tactile cues required            PT Short Term Goals - 10/28/19 1207      PT SHORT TERM GOAL #1   Title Patient will be independent in home exercise program to improve strength/mobility for better functional independence with ADLs.    Baseline 5/19: HEP next session 6/22: HEP compliant    Time 4    Period Weeks    Status Partially Met    Target Date 10/22/19      PT SHORT TERM GOAL #2   Title Patient will perform a sit to stand with SUE support for increased ind. with transfers.    Baseline 5/19: heavy BUE support 6/22: requires one hand on walker one hand on w/c    Time 4    Period Weeks    Status On-going    Target Date 10/22/19             PT Long Term Goals - 12/02/19 0001      PT LONG TERM GOAL #1   Title Patient will increase FOTO score to equal to or greater than  53/100   to demonstrate statistically significant improvement in mobility and quality of life.     Baseline 7/27:  48.1% 6/22: 46.99% 5/18: 48/100    Time 12    Period Weeks    Status On-going    Target Date 12/16/19      PT LONG TERM GOAL #2   Title Patient (< 68  years old) will complete five times sit to stand test in < 20 seconds indicating an increased LE strength and improved balance.    Baseline 7/27: 26.8 seconds with one partial stand 6/22: 40. 86 sec; unable to reach full stand last two attempts 5/19: 42 seconds     Time 12    Period Weeks    Status Partially Met    Target Date 12/16/19      PT LONG TERM GOAL #3   Title Patient will increase 10 meter walk test to <20 seconds as to  improve gait speed for better community ambulation and to reduce fall risk.    Baseline 7/27: 36 seconds with RW 6/22: 36.1 seconds 5/19: 42 seconds    Time 12    Period Weeks    Status Partially Met    Target Date 12/16/19      PT LONG TERM GOAL #4   Title Patient will reduce timed up and go to <11 seconds to reduce fall risk and demonstrate improved transfer/gait ability.    Baseline 7/27: 43.65 6/22: 45.4 seconds 5/19: 47 seconds    Time 12    Period Weeks    Status Partially Met    Target Date 12/16/19                 Plan - 12/02/19 1724    Clinical Impression Statement Patient has made excellent progress towards functional goals increase speed of ambulation and transfers as well as increasing FOTO goal. Stability with mobility continues to be an area for improvement. Patient's condition has the potential to improve in response to therapy. Maximum improvement is yet to be obtained. The anticipated improvement is attainable and reasonable in a generally predictable time.The patient would benefit from further skilled PT intervention to maximize QOL, safety, and functional mobility    Personal Factors and Comorbidities Age;Comorbidity 3+;Fitness;Past/Current Experience;Sex;Time since onset of injury/illness/exacerbation    Comorbidities HTN, SVT, neurogenic bowel, MS (1995), neuropathy, hx of wrist fx, HPV, Hypercholesteremia, adiposity, osteopenia, spasticity.    Examination-Activity Limitations Bathing;Bed  Mobility;Bend;Carry;Continence;Dressing;Hygiene/Grooming;Stairs;Squat;Reach Overhead;Locomotion Level;Lift;Stand;Transfers    Examination-Participation Restrictions Church;Cleaning;Community Activity;Laundry;Volunteer;Shop;Meal Prep;Yard Work    [employer]chant [employer] officer Evolving/Moderate complexity    Rehab Potential Good    PT Frequency 2x / week    PT Duration 12 weeks    PT Treatment/Interventions ADLs/Self Care Home Management;Aquatic Therapy;Biofeedback;Cryotherapy;Electrical Stimulation;Iontophoresis 72m/ml Dexamethasone;Traction;Moist Heat;Ultrasound;DME Instruction;Gait training;Stair training;Functional mobility training;Therapeutic activities;Therapeutic exercise;Balance training;Neuromuscular re-education;Patient/family education;Orthotic Fit/Training;Wheelchair mobility training;Manual techniques;Compression bandaging;Passive range of motion;Dry needling;Energy conservation;Taping;Splinting;Vestibular    PT Next Visit Plan step negotiation, turning directions to the L and R    PT Home Exercise Plan maintain current HEP from HHPT    Consulted and Agree with Plan of Care Patient           Patient will benefit from skilled therapeutic intervention in order to improve the following deficits and impairments:  Abnormal gait, Decreased activity tolerance, Decreased balance, Decreased coordination, Decreased endurance, Decreased mobility, Decreased range of motion, Decreased strength, Difficulty walking, Impaired perceived functional ability, Impaired sensation, Impaired tone, Impaired UE functional use, Improper body mechanics, Postural dysfunction, Cardiopulmonary status limiting activity, Impaired flexibility, Increased muscle spasms  Visit Diagnosis: Multiple sclerosis exacerbation (HCC)  Muscle weakness (generalized)  Other abnormalities of gait and mobility  Unsteadiness on feet     Problem List Patient Active Problem List   Diagnosis Date Noted  . Hypoalbuminemia  due to protein-calorie malnutrition ([place]   . Neurogenic bowel   . Neurogenic bladder   . Labile blood pressure   . Neuropathic pain   . Abscess of female pelvis   . SVT (supraventricular tachycardia) ([place]   . Radial styloid tenosynovitis 03/12/2018  . Wheelchair confinement 02/27/2018  . Localized osteoporosis with current pathological fracture with routine healing 01/19/2017  . Wrist fracture 01/16/2017  . Sprain of ankle 03/23/2016  . Closed fracture of lateral malleolus 03/16/2016  . Health care maintenance 01/24/2016  . Blood pressure elevated without history of HTN 10/25/2015  . Essential hypertension 10/25/2015  . Multiple sclerosis ([place] 10/02/2015  . Chronic left  shoulder pain 07/19/2015  . Multiple sclerosis exacerbation ([place]) 07/14/2015  . MS (multiple sclerosis) ([place]) 11/26/2014  . Increased body mass index 11/26/2014  . HPV test positive 11/26/2014  . Status post laparoscopic supracervical hysterectomy 11/26/2014  . Galactorrhea 11/26/2014  . Back ache 05/21/2014  . Adiposity 05/21/2014  . Disordered sleep 05/21/2014  . Muscle spasticity 05/21/2014  . Spasticity 05/21/2014  . Calculus of kidney 12/09/2013  . Renal colic 33/82/5053  . Hypercholesteremia 08/19/2013  . Hereditary and idiopathic neuropathy 08/19/2013  . Hypercholesterolemia without hypertriglyceridemia 08/19/2013  . Bladder infection, chronic 07/25/2012  . Disorder of bladder function 07/25/2012  . Incomplete bladder emptying 07/25/2012  . Microscopic hematuria 07/25/2012  . Right upper quadrant pain 07/25/2012   Janna Arch, PT, DPT   12/02/2019, 5:25 PM  [place] MAIN [facility] SERVICES [address] [place], [place], [address] Phone: [phone]   Fax:  [phone]  Name: Lisa Williams MRN: [id number] Date of Birth: [birth year]

## 2019-12-04 ENCOUNTER — Ambulatory Visit: Payer: PPO

## 2019-12-04 ENCOUNTER — Other Ambulatory Visit: Payer: Self-pay

## 2019-12-04 DIAGNOSIS — R2689 Other abnormalities of gait and mobility: Secondary | ICD-10-CM

## 2019-12-04 DIAGNOSIS — M6281 Muscle weakness (generalized): Secondary | ICD-10-CM

## 2019-12-04 DIAGNOSIS — R2681 Unsteadiness on feet: Secondary | ICD-10-CM

## 2019-12-04 DIAGNOSIS — G35 Multiple sclerosis: Secondary | ICD-10-CM

## 2019-12-04 NOTE — Therapy (Signed)
[facility] SERVICES [place], [place], [address] Phone: [phone]   Fax:  [phone]  Physical Therapy Treatment  Patient Details  Name: Lisa Williams MRN: [id number] Date of Birth: 1960-09-16 Referring Provider (PT): Gurney Maxin, MD   Encounter Date: 12/04/2019   PT End of Session - 12/04/19 1109    Visit Number 21    Number of Visits 24    Date for PT Re-Evaluation 12/16/19    Authorization Type 1/10 PN 7/27    PT Start Time 1016    PT Stop Time 1100    PT Time Calculation (min) 44 min    Equipment Utilized During Treatment Gait belt;Other (comment)   bilateral AFOs   Activity Tolerance Patient tolerated treatment well    Behavior During Therapy WFL for tasks assessed/performed           Past Medical History:  Diagnosis Date  . Abdominal pain, right upper quadrant   . Back pain   . Calculus of kidney 12/09/2013  . Chronic back pain    unspecified  . Chronic left shoulder pain 07/19/2015  . Functional disorder of bladder    other  . Galactorrhea 11/26/2014   Chronic   . Hereditary and idiopathic neuropathy 08/19/2013  . HPV test positive   . Hypercholesteremia 08/19/2013  . Incomplete bladder emptying   . Microscopic hematuria   . MS (multiple sclerosis) ([place])   . Muscle spasticity 05/21/2014  . Nonspecific findings on examination of urine    other  . Osteopenia   . Status post laparoscopic supracervical hysterectomy 11/26/2014  . Tobacco user 11/26/2014  . Wrist fracture     Past Surgical History:  Procedure Laterality Date  . bilateral tubal ligation  1996  . BREAST CYST EXCISION Left 2002  . KNEE SURGERY     right  . LAPAROSCOPIC SUPRACERVICAL HYSTERECTOMY  08/05/2013  . ORIF WRIST FRACTURE Left 01/17/2017   Procedure: OPEN REDUCTION INTERNAL FIXATION (ORIF) WRIST FRACTURE;  Surgeon: Lovell Sheehan, MD;  Location: [facility] ORS;  Service: Orthopedics;  Laterality: Left;  . TUBAL  LIGATION Bilateral   . VAGINAL HYSTERECTOMY  03/2006    There were no vitals filed for this visit.   Subjective Assessment - 12/04/19 1107    Subjective Patient presents with increased feeling of heat due to high temperatures outside. has been compliant with HEP and now is able to take sponge baths in bathroom standing up.    Pertinent History Patient is previous patient of this therapist, was admitted to the hospital on 08/03/19 for MS exacerbation and then went to inpatient rehab on 08/08/19. Patient then received home health therapy and now is returning to outpatient PT. PMh includes HTN, SVT, neurogenic bowel, MS (1995), neuropathy, hx of wrist fx, HPV, Hypercholesteremia, adiposity, osteopenia, spasticity. She wears bilateral AFOs and uses a RW and/or wheelchair for mobility. She drives an adapted car.    Limitations Lifting;Standing;Walking;House hold activities    How long can you sit comfortably? n/a    How long can you stand comfortably? 4 minutes    How long can you walk comfortably? walked 50 ft in inpatient rehab    Patient Stated Goals return to PLOF, increase walking, get into shower, increase mobility    Currently in Pain? No/denies           standing in // bars; contact guard with focus on core activation and stability  -static stand reach lateral to grab ball,  single UE to no UE support throw at target 4 ft away. 15 balls, x 2 trials with seated rest break between sets -standing weight shift laterally x 60 seconds -standing partial squat with BUE support 5-6x, two sets for 11 total. Seated rest break with ice between sets     Seated: cues for body mechanics and sequencing for optimal muscle recruitment:    -Balloon taps reaching inside/outside BOS for stability, mobility, core activation, and muscle righting reactions x 3 minutes -forward lean with reach to ceiling, open arm abduction and return to resting position x 10 -hamstring stretch with forward reach 2x 30 second  holds    Patient utilizes ice pack for reduction of internal temperature to reduce risk of MS exacerbation and allow continuation of progression of interventions.     Pt educated throughout session about proper posture and technique with exercises. Improved exercise technique, movement at target joints, use of target muscles after min to mod verbal, visual, tactile cues.                           PT Education - 12/04/19 1109    Education provided Yes    Education Details exercise technique, stability    Person(s) Educated Patient    Methods Explanation;Demonstration;Tactile cues;Verbal cues    Comprehension Verbalized understanding;Returned demonstration;Verbal cues required;Tactile cues required            PT Short Term Goals - 10/28/19 1207      PT SHORT TERM GOAL #1   Title Patient will be independent in home exercise program to improve strength/mobility for better functional independence with ADLs.    Baseline 5/19: HEP next session 6/22: HEP compliant    Time 4    Period Weeks    Status Partially Met    Target Date 10/22/19      PT SHORT TERM GOAL #2   Title Patient will perform a sit to stand with SUE support for increased ind. with transfers.    Baseline 5/19: heavy BUE support 6/22: requires one hand on walker one hand on w/c    Time 4    Period Weeks    Status On-going    Target Date 10/22/19             PT Long Term Goals - 12/02/19 0001      PT LONG TERM GOAL #1   Title Patient will increase FOTO score to equal to or greater than  53/100   to demonstrate statistically significant improvement in mobility and quality of life.     Baseline 7/27:  48.1% 6/22: 46.99% 5/18: 48/100    Time 12    Period Weeks    Status On-going    Target Date 12/16/19      PT LONG TERM GOAL #2   Title Patient (< 65 years old) will complete five times sit to stand test in < 20 seconds indicating an increased LE strength and improved balance.    Baseline  7/27: 26.8 seconds with one partial stand 6/22: 40. 86 sec; unable to reach full stand last two attempts 5/19: 42 seconds     Time 12    Period Weeks    Status Partially Met    Target Date 12/16/19      PT LONG TERM GOAL #3   Title Patient will increase 10 meter walk test to <20 seconds as to improve gait speed for better community ambulation and to reduce  fall risk.    Baseline 7/27: 36 seconds with RW 6/22: 36.1 seconds 5/19: 42 seconds    Time 12    Period Weeks    Status Partially Met    Target Date 12/16/19      PT LONG TERM GOAL #4   Title Patient will reduce timed up and go to <11 seconds to reduce fall risk and demonstrate improved transfer/gait ability.    Baseline 7/27: 43.65 6/22: 45.4 seconds 5/19: 47 seconds    Time 12    Period Weeks    Status Partially Met    Target Date 12/16/19                 Plan - 12/04/19 1115    Clinical Impression Statement Patient required more frequent rest breaks with ice this session due to high temperatures outside. Patient is progressing with stability in standing with decreased need for UE support at this time. Improved ability to flex knees and hips with good control and safety tolerated. The patient would benefit from further skilled PT intervention to maximize QOL, safety, and functional mobility    Personal Factors and Comorbidities Age;Comorbidity 3+;Fitness;Past/Current Experience;Sex;Time since onset of injury/illness/exacerbation    Comorbidities HTN, SVT, neurogenic bowel, MS (1995), neuropathy, hx of wrist fx, HPV, Hypercholesteremia, adiposity, osteopenia, spasticity.    Examination-Activity Limitations Bathing;Bed Mobility;Bend;Carry;Continence;Dressing;Hygiene/Grooming;Stairs;Squat;Reach Overhead;Locomotion Level;Lift;Stand;Transfers    Examination-Participation Restrictions Church;Cleaning;Community Activity;Laundry;Volunteer;Shop;Meal Prep;Yard Work    [employer]chant [employer] officer Evolving/Moderate complexity     Rehab Potential Good    PT Frequency 2x / week    PT Duration 12 weeks    PT Treatment/Interventions ADLs/Self Care Home Management;Aquatic Therapy;Biofeedback;Cryotherapy;Electrical Stimulation;Iontophoresis 57m/ml Dexamethasone;Traction;Moist Heat;Ultrasound;DME Instruction;Gait training;Stair training;Functional mobility training;Therapeutic activities;Therapeutic exercise;Balance training;Neuromuscular re-education;Patient/family education;Orthotic Fit/Training;Wheelchair mobility training;Manual techniques;Compression bandaging;Passive range of motion;Dry needling;Energy conservation;Taping;Splinting;Vestibular    PT Next Visit Plan step negotiation, turning directions to the L and R    PT Home Exercise Plan maintain current HEP from HHPT    Consulted and Agree with Plan of Care Patient           Patient will benefit from skilled therapeutic intervention in order to improve the following deficits and impairments:  Abnormal gait, Decreased activity tolerance, Decreased balance, Decreased coordination, Decreased endurance, Decreased mobility, Decreased range of motion, Decreased strength, Difficulty walking, Impaired perceived functional ability, Impaired sensation, Impaired tone, Impaired UE functional use, Improper body mechanics, Postural dysfunction, Cardiopulmonary status limiting activity, Impaired flexibility, Increased muscle spasms  Visit Diagnosis: Multiple sclerosis exacerbation (HCC)  Muscle weakness (generalized)  Other abnormalities of gait and mobility  Unsteadiness on feet     Problem List Patient Active Problem List   Diagnosis Date Noted  . Hypoalbuminemia due to protein-calorie malnutrition ([place]   . Neurogenic bowel   . Neurogenic bladder   . Labile blood pressure   . Neuropathic pain   . Abscess of female pelvis   . SVT (supraventricular tachycardia) ([place]   . Radial styloid tenosynovitis 03/12/2018  . Wheelchair confinement 02/27/2018  . Localized  osteoporosis with current pathological fracture with routine healing 01/19/2017  . Wrist fracture 01/16/2017  . Sprain of ankle 03/23/2016  . Closed fracture of lateral malleolus 03/16/2016  . Health care maintenance 01/24/2016  . Blood pressure elevated without history of HTN 10/25/2015  . Essential hypertension 10/25/2015  . Multiple sclerosis ([place] 10/02/2015  . Chronic left shoulder pain 07/19/2015  . Multiple sclerosis exacerbation ([place] 07/14/2015  . MS (multiple sclerosis) ([place] 11/26/2014  . Increased body mass index 11/26/2014  .  HPV test positive 11/26/2014  . Status post laparoscopic supracervical hysterectomy 11/26/2014  . Galactorrhea 11/26/2014  . Back ache 05/21/2014  . Adiposity 05/21/2014  . Disordered sleep 05/21/2014  . Muscle spasticity 05/21/2014  . Spasticity 05/21/2014  . Calculus of kidney 12/09/2013  . Renal colic 71/29/2909  . Hypercholesteremia 08/19/2013  . Hereditary and idiopathic neuropathy 08/19/2013  . Hypercholesterolemia without hypertriglyceridemia 08/19/2013  . Bladder infection, chronic 07/25/2012  . Disorder of bladder function 07/25/2012  . Incomplete bladder emptying 07/25/2012  . Microscopic hematuria 07/25/2012  . Right upper quadrant pain 07/25/2012   Janna Arch, PT, DPT   12/04/2019, 11:20 AM  [place] [facility] SERVICES [address] [place], [place], [address] Phone: [phone]   Fax:  [phone]  Name: Marlyn Tondreau Mcguire MRN: [id number] Date of Birth: 1960/07/12

## 2019-12-09 ENCOUNTER — Ambulatory Visit: Payer: PPO | Attending: Neurology

## 2019-12-09 ENCOUNTER — Other Ambulatory Visit: Payer: Self-pay

## 2019-12-09 DIAGNOSIS — G35 Multiple sclerosis: Secondary | ICD-10-CM | POA: Insufficient documentation

## 2019-12-09 DIAGNOSIS — R2681 Unsteadiness on feet: Secondary | ICD-10-CM | POA: Insufficient documentation

## 2019-12-09 DIAGNOSIS — M6281 Muscle weakness (generalized): Secondary | ICD-10-CM | POA: Diagnosis not present

## 2019-12-09 DIAGNOSIS — R2689 Other abnormalities of gait and mobility: Secondary | ICD-10-CM | POA: Insufficient documentation

## 2019-12-09 NOTE — Therapy (Signed)
[place] MAIN [facility] SERVICES [address] [place], [place], [address] Phone: [phone]   Fax:  [phone]  Physical Therapy Treatment  Patient Details  Name: Lisa Williams MRN: [id number] Date of Birth: 30-Jul-1960 Referring Provider (PT): Gurney Maxin, MD   Encounter Date: 12/09/2019   PT End of Session - 12/09/19 0998    Visit Number 22    Number of Visits 24    Date for PT Re-Evaluation 12/16/19    Authorization Type 2/10 PN 7/27    PT Start Time 1015    PT Stop Time 1059    PT Time Calculation (min) 44 min    Equipment Utilized During Treatment Gait belt;Other (comment)   bilateral AFOs   Activity Tolerance Patient tolerated treatment well    Behavior During Therapy [facility] for tasks assessed/performed           Past Medical History:  Diagnosis Date   Abdominal pain, right upper quadrant    Back pain    Calculus of kidney 12/09/2013   Chronic back pain    unspecified   Chronic left shoulder pain 07/19/2015   Functional disorder of bladder    other   Galactorrhea 11/26/2014   Chronic    Hereditary and idiopathic neuropathy 08/19/2013   HPV test positive    Hypercholesteremia 08/19/2013   Incomplete bladder emptying    Microscopic hematuria    MS (multiple sclerosis) (HCC)    Muscle spasticity 05/21/2014   Nonspecific findings on examination of urine    other   Osteopenia    Status post laparoscopic supracervical hysterectomy 11/26/2014   Tobacco user 11/26/2014   Wrist fracture     Past Surgical History:  Procedure Laterality Date   bilateral tubal ligation  1996   BREAST CYST EXCISION Left 2002   KNEE SURGERY     right   LAPAROSCOPIC SUPRACERVICAL HYSTERECTOMY  08/05/2013   ORIF WRIST FRACTURE Left 01/17/2017   Procedure: OPEN REDUCTION INTERNAL FIXATION (ORIF) WRIST FRACTURE;  Surgeon: Lovell Sheehan, MD;  Location: [facility] ORS;  Service: Orthopedics;  Laterality: Left;   TUBAL  LIGATION Bilateral    VAGINAL HYSTERECTOMY  03/2006    There were no vitals filed for this visit.   Subjective Assessment - 12/09/19 1537    Subjective Patient reports no falls or LOB since last session. Has been able to be more independent at home.    Pertinent History Patient is previous patient of this therapist, was admitted to the hospital on 08/03/19 for MS exacerbation and then went to inpatient rehab on 08/08/19. Patient then received home health therapy and now is returning to outpatient PT. PMh includes HTN, SVT, neurogenic bowel, MS (1995), neuropathy, hx of wrist fx, HPV, Hypercholesteremia, adiposity, osteopenia, spasticity. She wears bilateral AFOs and uses a RW and/or wheelchair for mobility. She drives an adapted car.    Limitations Lifting;Standing;Walking;House hold activities    How long can you sit comfortably? n/a    How long can you stand comfortably? 4 minutes    How long can you walk comfortably? walked 50 ft in inpatient rehab    Patient Stated Goals return to PLOF, increase walking, get into shower, increase mobility    Currently in Pain? No/denies                standing in // bars; contact guard with focus on core activation and stability  -ambulate forwards with focus on weight shift and foot clearance to end of //  bars, return back to starting position with backwards ambulation, cues for one hand movement at a time to increase SUE support demand x 4 with seated rest breaks -standing weight shift laterally x 60 seconds -standing modified tandem stance 2x 20-30 seconds with no UE support   Seated: cues for body mechanics and sequencing for optimal muscle recruitment:    -lateral trunk lean 12x each side; occasional Min A to return to center  -forward lean with reach to ceiling, open arm abduction and return to resting position x 10  -hamstring stretch with forward reach 2x 30 second holds   -GTB row 20x; cues for reduction of elevation of shoulders and  increasing scapular retraction and depression   -GTB ER 15x ; cues for elbows at side  -GTB chest pull 10x with focus on scapular retraction and depression  Isometric LE into PT hand 10x 3 second holds each attempt: -hip flexion, abduction, adduction, hamstring     Patient utilizes ice pack for reduction of internal temperature to reduce risk of MS exacerbation and allow continuation of progression of interventions.     Pt educated throughout session about proper posture and technique with exercises. Improved exercise technique, movement at target joints, use of target muscles after min to mod verbal, visual, tactile cues.                      PT Education - 12/09/19 1540    Education provided Yes    Education Details exercise technique, body mechanics    Person(s) Educated Patient    Methods Explanation;Demonstration;Tactile cues;Verbal cues    Comprehension Verbalized understanding;Returned demonstration;Verbal cues required;Tactile cues required            PT Short Term Goals - 10/28/19 1207      PT SHORT TERM GOAL #1   Title Patient will be independent in home exercise program to improve strength/mobility for better functional independence with ADLs.    Baseline 5/19: HEP next session 6/22: HEP compliant    Time 4    Period Weeks    Status Partially Met    Target Date 10/22/19      PT SHORT TERM GOAL #2   Title Patient will perform a sit to stand with SUE support for increased ind. with transfers.    Baseline 5/19: heavy BUE support 6/22: requires one hand on walker one hand on w/c    Time 4    Period Weeks    Status On-going    Target Date 10/22/19             PT Long Term Goals - 12/02/19 0001      PT LONG TERM GOAL #1   Title Patient will increase FOTO score to equal to or greater than  53/100   to demonstrate statistically significant improvement in mobility and quality of life.     Baseline 7/27:  48.1% 6/22: 46.99% 5/18: 48/100    Time  12    Period Weeks    Status On-going    Target Date 12/16/19      PT LONG TERM GOAL #2   Title Patient (< 47 years old) will complete five times sit to stand test in < 20 seconds indicating an increased LE strength and improved balance.    Baseline 7/27: 26.8 seconds with one partial stand 6/22: 40. 86 sec; unable to reach full stand last two attempts 5/19: 42 seconds     Time 12    Period  Weeks    Status Partially Met    Target Date 12/16/19      PT LONG TERM GOAL #3   Title Patient will increase 10 meter walk test to <20 seconds as to improve gait speed for better community ambulation and to reduce fall risk.    Baseline 7/27: 36 seconds with RW 6/22: 36.1 seconds 5/19: 42 seconds    Time 12    Period Weeks    Status Partially Met    Target Date 12/16/19      PT LONG TERM GOAL #4   Title Patient will reduce timed up and go to <11 seconds to reduce fall risk and demonstrate improved transfer/gait ability.    Baseline 7/27: 43.65 6/22: 45.4 seconds 5/19: 47 seconds    Time 12    Period Weeks    Status Partially Met    Target Date 12/16/19                 Plan - 12/09/19 1543    Clinical Impression Statement Patient continues to progress with functional mobility with increased standing tolerance and stability with decreasing UE support. Core activation and trunk stabilization continues to be areas for improvement and stability with increasing range outside BOS. The patient would benefit from further skilled PT intervention to maximize QOL, safety, and functional mobility    Personal Factors and Comorbidities Age;Comorbidity 3+;Fitness;Past/Current Experience;Sex;Time since onset of injury/illness/exacerbation    Comorbidities HTN, SVT, neurogenic bowel, MS (1995), neuropathy, hx of wrist fx, HPV, Hypercholesteremia, adiposity, osteopenia, spasticity.    Examination-Activity Limitations Bathing;Bed Mobility;Bend;Carry;Continence;Dressing;Hygiene/Grooming;Stairs;Squat;Reach  Overhead;Locomotion Level;Lift;Stand;Transfers    Examination-Participation Restrictions Church;Cleaning;Community Activity;Laundry;Volunteer;Shop;Meal Prep;Yard Work    [employer]chant [employer] officer Evolving/Moderate complexity    Rehab Potential Good    PT Frequency 2x / week    PT Duration 12 weeks    PT Treatment/Interventions ADLs/Self Care Home Management;Aquatic Therapy;Biofeedback;Cryotherapy;Electrical Stimulation;Iontophoresis 32m/ml Dexamethasone;Traction;Moist Heat;Ultrasound;DME Instruction;Gait training;Stair training;Functional mobility training;Therapeutic activities;Therapeutic exercise;Balance training;Neuromuscular re-education;Patient/family education;Orthotic Fit/Training;Wheelchair mobility training;Manual techniques;Compression bandaging;Passive range of motion;Dry needling;Energy conservation;Taping;Splinting;Vestibular    PT Next Visit Plan step negotiation, turning directions to the L and R    PT Home Exercise Plan [place] [facility] [facility] [place] [place] Agree with Plan of Care Patient           Patient will benefit from skilled therapeutic intervention in order to improve the following deficits and impairments:  Abnormal gait, Decreased activity tolerance, Decreased balance, Decreased coordination, Decreased endurance, Decreased mobility, Decreased range of motion, Decreased strength, Difficulty walking, Impaired perceived functional ability, Impaired sensation, Impaired tone, Impaired UE functional use, Improper body mechanics, Postural dysfunction, Cardiopulmonary status limiting activity, Impaired flexibility, Increased muscle spasms  Visit Diagnosis: Multiple sclerosis exacerbation (HCC)  Muscle weakness (generalized)  Other abnormalities of gait and mobility  Unsteadiness on feet     Problem List Patient Active Problem List   Diagnosis Date Noted   Hypoalbuminemia due to protein-calorie malnutrition ([place]    Neurogenic bowel     Neurogenic bladder    Labile blood pressure    Neuropathic pain    Abscess of female pelvis    SVT (supraventricular tachycardia) (HCC)    Radial styloid tenosynovitis 03/12/2018   Wheelchair confinement 02/27/2018   Localized osteoporosis with current pathological fracture with routine healing 01/19/2017   Wrist fracture 01/16/2017   Sprain of ankle 03/23/2016   Closed fracture of lateral malleolus 03/16/2016   Health care maintenance 01/24/2016   Blood pressure elevated without history of HTN 10/25/2015  Essential hypertension 10/25/2015   Multiple sclerosis ([place]) 10/02/2015   Chronic left shoulder pain 07/19/2015   Multiple sclerosis exacerbation ([place]) 07/14/2015   MS (multiple sclerosis) ([place]) 11/26/2014   Increased body mass index 11/26/2014   HPV test positive 11/26/2014   Status post laparoscopic supracervical hysterectomy 11/26/2014   Galactorrhea 11/26/2014   Back ache 05/21/2014   Adiposity 05/21/2014   Disordered sleep 05/21/2014   Muscle spasticity 05/21/2014   Spasticity 05/21/2014   Calculus of kidney 34/14/4360   Renal colic 16/58/0063   Hypercholesteremia 08/19/2013   Hereditary and idiopathic neuropathy 08/19/2013   Hypercholesterolemia without hypertriglyceridemia 08/19/2013   Bladder infection, chronic 07/25/2012   Disorder of bladder function 07/25/2012   Incomplete bladder emptying 07/25/2012   Microscopic hematuria 07/25/2012   Right upper quadrant pain 07/25/2012   Janna Arch, PT, DPT   12/09/2019, 3:45 PM  [facility] MAIN [facility] SERVICES [address] [place], [place], [address] Phone: [phone]   Fax:  [phone]  Name: Lisa Williams MRN: [id number] Date of Birth: Dec 06, 1960

## 2019-12-11 ENCOUNTER — Ambulatory Visit: Payer: PPO

## 2019-12-11 ENCOUNTER — Other Ambulatory Visit: Payer: Self-pay

## 2019-12-11 DIAGNOSIS — M6281 Muscle weakness (generalized): Secondary | ICD-10-CM

## 2019-12-11 DIAGNOSIS — G35 Multiple sclerosis: Secondary | ICD-10-CM

## 2019-12-11 DIAGNOSIS — R2681 Unsteadiness on feet: Secondary | ICD-10-CM

## 2019-12-11 DIAGNOSIS — R2689 Other abnormalities of gait and mobility: Secondary | ICD-10-CM

## 2019-12-11 NOTE — Therapy (Signed)
[place] MAIN [facility] SERVICES [address] [place], [place], [address] Phone: [phone]   Fax:  [phone]  Physical Therapy Treatment  Patient Details  Name: Lisa Williams MRN: [id number] Date of Birth: 05/03/1961 Referring Provider (PT): Gurney Maxin, MD   Encounter Date: 12/11/2019   PT End of Session - 12/11/19 1247    Visit Number 23    Number of Visits 24    Date for PT Re-Evaluation 12/16/19    Authorization Type 3/10 PN 7/27    PT Start Time 1015    PT Stop Time 1100    PT Time Calculation (min) 45 min    Equipment Utilized During Treatment Gait belt;Other (comment)   bilateral AFOs   Activity Tolerance Patient tolerated treatment well    Behavior During Therapy WFL for tasks assessed/performed           Past Medical History:  Diagnosis Date  . Abdominal pain, right upper quadrant   . Back pain   . Calculus of kidney 12/09/2013  . Chronic back pain    unspecified  . Chronic left shoulder pain 07/19/2015  . Functional disorder of bladder    other  . Galactorrhea 11/26/2014   Chronic   . Hereditary and idiopathic neuropathy 08/19/2013  . HPV test positive   . Hypercholesteremia 08/19/2013  . Incomplete bladder emptying   . Microscopic hematuria   . MS (multiple sclerosis) ([place])   . Muscle spasticity 05/21/2014  . Nonspecific findings on examination of urine    other  . Osteopenia   . Status post laparoscopic supracervical hysterectomy 11/26/2014  . Tobacco user 11/26/2014  . Wrist fracture     Past Surgical History:  Procedure Laterality Date  . bilateral tubal ligation  1996  . BREAST CYST EXCISION Left 2002  . KNEE SURGERY     right  . LAPAROSCOPIC SUPRACERVICAL HYSTERECTOMY  08/05/2013  . ORIF WRIST FRACTURE Left 01/17/2017   Procedure: OPEN REDUCTION INTERNAL FIXATION (ORIF) WRIST FRACTURE;  Surgeon: Lovell Sheehan, MD;  Location: [facility] ORS;  Service: Orthopedics;  Laterality: Left;  . TUBAL  LIGATION Bilateral   . VAGINAL HYSTERECTOMY  03/2006    There were no vitals filed for this visit.   Subjective Assessment - 12/11/19 1244    Subjective Pt has no new complaints upon arrival today; she reports the wheelchair she is using is newer- the one she had before was not able to be fixed so she was issued a new one.    Pertinent History Patient is previous patient of this therapist, was admitted to the hospital on 08/03/19 for MS exacerbation and then went to inpatient rehab on 08/08/19. Patient then received home health therapy and now is returning to outpatient PT. PMh includes HTN, SVT, neurogenic bowel, MS (1995), neuropathy, hx of wrist fx, HPV, Hypercholesteremia, adiposity, osteopenia, spasticity. She wears bilateral AFOs and uses a RW and/or wheelchair for mobility. She drives an adapted car.    Limitations Lifting;Standing;Walking;House hold activities    How long can you sit comfortably? n/a    How long can you stand comfortably? 4 minutes    How long can you walk comfortably? walked 50 ft in inpatient rehab    Patient Stated Goals return to PLOF, increase walking, get into shower, increase mobility           Treatment Today:   standing in // bars; contact guard with focus on core activation and stability  -ambulate forwards with focus  on weight shift and foot clearance to end of // bars, return back to starting position with backwards ambulation, cues for one hand movement at a time to increase SUE support demand x 4 with seated rest breaks -standing weight shift laterally x 60 seconds -standing modified tandem stance 2x 20-30 seconds with no UE support   Seated: cues for body mechanics and sequencing for optimal muscle recruitment:    -forward lean with reach to ceiling, open arm abduction and return to resting position x 10   -hamstring stretch with forward reach 2x 30 second holds with LE prop on PT knee   -GTB row 20x; cues for reduction of elevation of shoulders  and increasing scapular retraction and depression    -GTB ER 15x ; cues for elbows at side   -GTB chest pull 10x with focus on scapular retraction and depression   Isometric LE into PT hand 10x 3 second holds each attempt: -hip flexion, abduction, adduction, hamstring      Patient utilizes ice pack for reduction of internal temperature to reduce risk of MS exacerbation and allow continuation of progression of interventions.     Pt educated throughout session about proper posture and technique with exercises. Improved exercise technique, movement at target joints, use of target muscles after min to mod verbal, visual, tactile cues.       PT Education - 12/11/19 1246    Education provided Yes    Education Details exercise technique/body Programmer, systems) Educated Patient    Methods Explanation;Demonstration;Tactile cues;Verbal cues    Comprehension Verbalized understanding;Returned demonstration;Verbal cues required;Tactile cues required            PT Short Term Goals - 10/28/19 1207      PT SHORT TERM GOAL #1   Title Patient will be independent in home exercise program to improve strength/mobility for better functional independence with ADLs.    Baseline 5/19: HEP next session 6/22: HEP compliant    Time 4    Period Weeks    Status Partially Met    Target Date 10/22/19      PT SHORT TERM GOAL #2   Title Patient will perform a sit to stand with SUE support for increased ind. with transfers.    Baseline 5/19: heavy BUE support 6/22: requires one hand on walker one hand on w/c    Time 4    Period Weeks    Status On-going    Target Date 10/22/19             PT Long Term Goals - 12/02/19 0001      PT LONG TERM GOAL #1   Title Patient will increase FOTO score to equal to or greater than  53/100   to demonstrate statistically significant improvement in mobility and quality of life.     Baseline 7/27:  48.1% 6/22: 46.99% 5/18: 48/100    Time 12    Period Weeks     Status On-going    Target Date 12/16/19      PT LONG TERM GOAL #2   Title Patient (< 33 years old) will complete five times sit to stand test in < 20 seconds indicating an increased LE strength and improved balance.    Baseline 7/27: 26.8 seconds with one partial stand 6/22: 40. 86 sec; unable to reach full stand last two attempts 5/19: 42 seconds     Time 12    Period Weeks    Status Partially Met  Target Date 12/16/19      PT LONG TERM GOAL #3   Title Patient will increase 10 meter walk test to <20 seconds as to improve gait speed for better community ambulation and to reduce fall risk.    Baseline 7/27: 36 seconds with RW 6/22: 36.1 seconds 5/19: 42 seconds    Time 12    Period Weeks    Status Partially Met    Target Date 12/16/19      PT LONG TERM GOAL #4   Title Patient will reduce timed up and go to <11 seconds to reduce fall risk and demonstrate improved transfer/gait ability.    Baseline 7/27: 43.65 6/22: 45.4 seconds 5/19: 47 seconds    Time 12    Period Weeks    Status Partially Met    Target Date 12/16/19                 Plan - 12/11/19 1248    Clinical Impression Statement Pt demonstrates ability to weight shift R and L with heavy UE leaning on parallel bars.  Able to perform modified tandem stance for 20-30 sec intervals.  Frequent rest breaks with ice pack required to be able to complete session.  Pt would cont to benefit from skilled PT to maximize QOL, safety, and functional mobility.    Personal Factors and Comorbidities Age;Comorbidity 3+;Fitness;Past/Current Experience;Sex;Time since onset of injury/illness/exacerbation    Comorbidities HTN, SVT, neurogenic bowel, MS (1995), neuropathy, hx of wrist fx, HPV, Hypercholesteremia, adiposity, osteopenia, spasticity.    Examination-Activity Limitations Bathing;Bed Mobility;Bend;Carry;Continence;Dressing;Hygiene/Grooming;Stairs;Squat;Reach Overhead;Locomotion Level;Lift;Stand;Transfers     Examination-Participation Restrictions Church;Cleaning;Community Activity;Laundry;Volunteer;Shop;Meal Prep;Yard Work    Merchant [employer] officer Evolving/Moderate complexity    Rehab Potential Good    PT Frequency 2x / week    PT Duration 12 weeks    PT Treatment/Interventions ADLs/Self Care Home Management;Aquatic Therapy;Biofeedback;Cryotherapy;Electrical Stimulation;Iontophoresis 57m/ml Dexamethasone;Traction;Moist Heat;Ultrasound;DME Instruction;Gait training;Stair training;Functional mobility training;Therapeutic activities;Therapeutic exercise;Balance training;Neuromuscular re-education;Patient/family education;Orthotic Fit/Training;Wheelchair mobility training;Manual techniques;Compression bandaging;Passive range of motion;Dry needling;Energy conservation;Taping;Splinting;Vestibular    PT Next Visit Plan step negotiation, turning directions to the L and R    PT Home Exercise Plan maintain current HEP from [facility]    Consulted and Agree with Plan of Care Patient           Patient will benefit from skilled therapeutic intervention in order to improve the following deficits and impairments:  Abnormal gait, Decreased activity tolerance, Decreased balance, Decreased coordination, Decreased endurance, Decreased mobility, Decreased range of motion, Decreased strength, Difficulty walking, Impaired perceived functional ability, Impaired sensation, Impaired tone, Impaired UE functional use, Improper body mechanics, Postural dysfunction, Cardiopulmonary status limiting activity, Impaired flexibility, Increased muscle spasms  Visit Diagnosis: Multiple sclerosis exacerbation (HCC)  Muscle weakness (generalized)  Other abnormalities of gait and mobility  Unsteadiness on feet     Problem List Patient Active Problem List   Diagnosis Date Noted  . Hypoalbuminemia due to protein-calorie malnutrition ([place]   . Neurogenic bowel   . Neurogenic bladder   . Labile blood pressure   .  Neuropathic pain   . Abscess of female pelvis   . SVT (supraventricular tachycardia) ([place]   . Radial styloid tenosynovitis 03/12/2018  . Wheelchair confinement 02/27/2018  . Localized osteoporosis with current pathological fracture with routine healing 01/19/2017  . Wrist fracture 01/16/2017  . Sprain of ankle 03/23/2016  . Closed fracture of lateral malleolus 03/16/2016  . Health care maintenance 01/24/2016  . Blood pressure elevated without history of HTN 10/25/2015  . Essential hypertension 10/25/2015  .  Multiple sclerosis ([place]) 10/02/2015  . Chronic left shoulder pain 07/19/2015  . Multiple sclerosis exacerbation ([place]) 07/14/2015  . MS (multiple sclerosis) ([place]) 11/26/2014  . Increased body mass index 11/26/2014  . HPV test positive 11/26/2014  . Status post laparoscopic supracervical hysterectomy 11/26/2014  . Galactorrhea 11/26/2014  . Back ache 05/21/2014  . Adiposity 05/21/2014  . Disordered sleep 05/21/2014  . Muscle spasticity 05/21/2014  . Spasticity 05/21/2014  . Calculus of kidney 12/09/2013  . Renal colic 96/72/8979  . Hypercholesteremia 08/19/2013  . Hereditary and idiopathic neuropathy 08/19/2013  . Hypercholesterolemia without hypertriglyceridemia 08/19/2013  . Bladder infection, chronic 07/25/2012  . Disorder of bladder function 07/25/2012  . Incomplete bladder emptying 07/25/2012  . Microscopic hematuria 07/25/2012  . Right upper quadrant pain 07/25/2012    Pincus Badder 12/11/2019, 12:55 PM Merdis Delay, PT, DPT Physical Therapist - [facility]  Outpatient Physical Therapy- [place] MAIN [facility] SERVICES [address] [place], [place], [address] Phone: [phone]   Fax:  [phone]  Name: Lisa Williams MRN: [id number] Date of Birth: January 29, 1961

## 2019-12-16 ENCOUNTER — Other Ambulatory Visit: Payer: Self-pay

## 2019-12-16 ENCOUNTER — Ambulatory Visit: Payer: PPO

## 2019-12-16 DIAGNOSIS — R2689 Other abnormalities of gait and mobility: Secondary | ICD-10-CM

## 2019-12-16 DIAGNOSIS — G35 Multiple sclerosis: Secondary | ICD-10-CM | POA: Diagnosis not present

## 2019-12-16 DIAGNOSIS — M6281 Muscle weakness (generalized): Secondary | ICD-10-CM

## 2019-12-16 DIAGNOSIS — R2681 Unsteadiness on feet: Secondary | ICD-10-CM

## 2019-12-16 NOTE — Therapy (Signed)
[place] MAIN [facility] SERVICES [address] [place], [place], [address] Phone: [phone]   Fax:  [phone]  Physical Therapy Treatment/RECERT  Patient Details  Name: Lisa Williams MRN: [id number] Date of Birth: 1961/05/01 Referring Provider (PT): Gurney Maxin, MD   Encounter Date: 12/16/2019   PT End of Session - 12/16/19 9024    Visit Number 24    Number of Visits 48    Date for PT Re-Evaluation 03/09/20    Authorization Type 4/10 PN 7/27    PT Start Time 1015    PT Stop Time 1057    PT Time Calculation (min) 42 min    Equipment Utilized During Treatment Gait belt;Other (comment)   bilateral AFOs   Activity Tolerance Patient tolerated treatment well    Behavior During Therapy WFL for tasks assessed/performed           Past Medical History:  Diagnosis Date  . Abdominal pain, right upper quadrant   . Back pain   . Calculus of kidney 12/09/2013  . Chronic back pain    unspecified  . Chronic left shoulder pain 07/19/2015  . Functional disorder of bladder    other  . Galactorrhea 11/26/2014   Chronic   . Hereditary and idiopathic neuropathy 08/19/2013  . HPV test positive   . Hypercholesteremia 08/19/2013  . Incomplete bladder emptying   . Microscopic hematuria   . MS (multiple sclerosis) ([place])   . Muscle spasticity 05/21/2014  . Nonspecific findings on examination of urine    other  . Osteopenia   . Status post laparoscopic supracervical hysterectomy 11/26/2014  . Tobacco user 11/26/2014  . Wrist fracture     Past Surgical History:  Procedure Laterality Date  . bilateral tubal ligation  1996  . BREAST CYST EXCISION Left 2002  . KNEE SURGERY     right  . LAPAROSCOPIC SUPRACERVICAL HYSTERECTOMY  08/05/2013  . ORIF WRIST FRACTURE Left 01/17/2017   Procedure: OPEN REDUCTION INTERNAL FIXATION (ORIF) WRIST FRACTURE;  Surgeon: Lovell Sheehan, MD;  Location: [facility] ORS;  Service: Orthopedics;  Laterality: Left;  . TUBAL  LIGATION Bilateral   . VAGINAL HYSTERECTOMY  03/2006    There were no vitals filed for this visit.   Subjective Assessment - 12/16/19 1315    Subjective Patient reports feeling warm today, needs to use ice more often today. No falls or LOB since last session.  Is thinking about getting a new walker since hers is falling apart.    Pertinent History Patient is previous patient of this therapist, was admitted to the hospital on 08/03/19 for MS exacerbation and then went to inpatient rehab on 08/08/19. Patient then received home health therapy and now is returning to outpatient PT. PMh includes HTN, SVT, neurogenic bowel, MS (1995), neuropathy, hx of wrist fx, HPV, Hypercholesteremia, adiposity, osteopenia, spasticity. She wears bilateral AFOs and uses a RW and/or wheelchair for mobility. She drives an adapted car.    Limitations Lifting;Standing;Walking;House hold activities    How long can you sit comfortably? n/a    How long can you stand comfortably? 4 minutes    How long can you walk comfortably? walked 50 ft in inpatient rehab    Patient Stated Goals return to PLOF, increase walking, get into shower, increase mobility    Currently in Pain? No/denies               Goals performed 4 sessions prior on 12/02/19, please refer to these for details on  progressions.   Treatment Today:     Ambulate65 ft with RW and wheelchair follow with CGA, cues for foot clearance and sequencing. Sat and rest and then ambulated an additional 75 ft with good increased step length and weight shift. Increased challenges of turning corners and entering through doors performed this session.    Standing reach for balls in bucket stand with SUE support on RW and throw into hoop 10x each side, seated rest break between UE switch.    Seated: cues for body mechanics and sequencing for optimal muscle recruitment:   Reach outside BOS grab a basketball, sit upright without support and throw into hoop x 4 minutes for  pertubation's, core stabilization, and righting reactions.   -forward lean with reach to ceiling, open arm abduction and return to resting position x 10  -modified seated windmills 10x each side for core stability, upright posture, reach and ability to return to [facility] without UE support.     isometric contraction against PT resistance: -hip flexion 10x 3 second hold -hip abduction 10x 3 second hold -hip adduction 10x 3 second holds   Patient utilizes ice pack for reduction of internal temperature to reduce risk of MS exacerbation and allow continuation of progression of interventions.   Pt educated throughout session about proper posture and technique with exercises. Improved exercise technique, movement at target joints, use of target muscles after min to mod verbal, visual, tactile cues.     Goals performed 4 sessions prior on 12/02/19, please refer to these for details on progressions. She is progressing with capacity for functional ambulation duration as well as stability in standing position.  She is able to stabilize herself in standing for short durations with single UE support while dynamically reachign with other UE allowing independence with iADLs and ADLs. A new functional mobility goal will be added to POC to continue progressing patient's independence. The patient would benefit from further skilled PT intervention to maximize QOL, safety, and functional mobility            PT Education - 12/16/19 1317    Education provided Yes    Education Details POC, exercise technique, body mechanics    Person(s) Educated Patient    Methods Explanation;Demonstration;Tactile cues;Verbal cues    Comprehension Verbalized understanding;Returned demonstration;Verbal cues required;Tactile cues required            PT Short Term Goals - 12/16/19 1319      PT SHORT TERM GOAL #1   Title Patient will be independent in home exercise program to improve strength/mobility for better  functional independence with ADLs.    Baseline 5/19: HEP next session 6/22: HEP compliant 8/10 : HEP progressions compliant    Time 4    Period Weeks    Status Partially Met    Target Date 01/13/20      PT SHORT TERM GOAL #2   Title Patient will perform a sit to stand with SUE support for increased ind. with transfers.    Baseline 5/19: heavy BUE support 6/22: requires one hand on walker one hand on w/c 8/10: able to perform intermittently (some days can some days cannot)    Time 4    Period Weeks    Status Partially Met    Target Date 01/13/20             PT Long Term Goals - 12/16/19 0001      PT LONG TERM GOAL #1   Title Patient will increase FOTO score to  equal to or greater than  53/100   to demonstrate statistically significant improvement in mobility and quality of life.     Baseline 7/27:  48.1% 6/22: 46.99% 5/18: 48/100    Time 12    Period Weeks    Status Partially Met    Target Date 03/09/20      PT LONG TERM GOAL #2   Title Patient (< 26 years old) will complete five times sit to stand test in < 20 seconds indicating an increased LE strength and improved balance.    Baseline 7/27: 26.8 seconds with one partial stand 6/22: 40. 86 sec; unable to reach full stand last two attempts 5/19: 42 seconds     Time 12    Period Weeks    Status Partially Met    Target Date 03/09/20      PT LONG TERM GOAL #3   Title Patient will increase 10 meter walk test to <20 seconds as to improve gait speed for better community ambulation and to reduce fall risk.    Baseline 7/27: 36 seconds with RW 6/22: 36.1 seconds 5/19: 42 seconds    Time 12    Period Weeks    Status Partially Met    Target Date 03/09/20      PT LONG TERM GOAL #4   Title Patient will reduce timed up and go to <11 seconds to reduce fall risk and demonstrate improved transfer/gait ability.    Baseline 7/27: 43.65 6/22: 45.4 seconds 5/19: 47 seconds    Time 12    Period Weeks    Status Partially Met    Target  Date 03/09/20      PT LONG TERM GOAL #5   Title Patient will ambulate from physical therapy gym to elevator down the hall, ride elevator to first floor, and walk to valet with RW and chair follow for increased capacity for mobility and ind.    Baseline 8/10: unable to ambulate to elevator without rest break    Time 12    Period Weeks    Status New    Target Date 03/09/20                 Plan - 12/16/19 1325    Clinical Impression Statement Goals performed 4 sessions prior on 12/02/19, please refer to these for details on progressions. She is progressing with capacity for functional ambulation duration as well as stability in standing position.  She is able to stabilize herself in standing for short durations with single UE support while dynamically reachign with other UE allowing independence with iADLs and ADLs. A new functional mobility goal will be added to POC to continue progressing patient's independence. The patient would benefit from further skilled PT intervention to maximize QOL, safety, and functional mobility    Personal Factors and Comorbidities Age;Comorbidity 3+;Fitness;Past/Current Experience;Sex;Time since onset of injury/illness/exacerbation    Comorbidities HTN, SVT, neurogenic bowel, MS (1995), neuropathy, hx of wrist fx, HPV, Hypercholesteremia, adiposity, osteopenia, spasticity.    Examination-Activity Limitations Bathing;Bed Mobility;Bend;Carry;Continence;Dressing;Hygiene/Grooming;Stairs;Squat;Reach Overhead;Locomotion Level;Lift;Stand;Transfers    Examination-Participation Restrictions Church;Cleaning;Community Activity;Laundry;Volunteer;Shop;Meal Prep;Yard Work    [employer] Evolving/Moderate complexity    Rehab Potential Good    PT Frequency 2x / week    PT Duration 12 weeks    PT Treatment/Interventions ADLs/Self Care Home Management;Aquatic Therapy;Biofeedback;Cryotherapy;Electrical Stimulation;Iontophoresis 71m/ml  Dexamethasone;Traction;Moist Heat;Ultrasound;DME Instruction;Gait training;Stair training;Functional mobility training;Therapeutic activities;Therapeutic exercise;Balance training;Neuromuscular re-education;Patient/family education;Orthotic Fit/Training;Wheelchair mobility training;Manual techniques;Compression bandaging;Passive range of motion;Dry needling;Energy conservation;Taping;Splinting;Vestibular    PT Next Visit  Plan step negotiation, turning directions to the L and R    PT Home Exercise Plan maintain current HEP from HHPT    Consulted and Agree with Plan of Care Patient           Patient will benefit from skilled therapeutic intervention in order to improve the following deficits and impairments:  Abnormal gait, Decreased activity tolerance, Decreased balance, Decreased coordination, Decreased endurance, Decreased mobility, Decreased range of motion, Decreased strength, Difficulty walking, Impaired perceived functional ability, Impaired sensation, Impaired tone, Impaired UE functional use, Improper body mechanics, Postural dysfunction, Cardiopulmonary status limiting activity, Impaired flexibility, Increased muscle spasms  Visit Diagnosis: Multiple sclerosis exacerbation (H[facility])  Muscle weakness (generalized)  Other abnormalities of gait and mobility  Unsteadiness on feet     Problem List Patient Active Problem List   Diagnosis Date Noted  . Hypoalbuminemia due to protein-calorie malnutrition ([place])   . Neurogenic bowel   . Neurogenic bladder   . Labile blood pressure   . Neuropathic pain   . Abscess of female pelvis   . SVT (supraventricular tachycardia) ([place])   . Radial styloid tenosynovitis 03/12/2018  . Wheelchair confinement 02/27/2018  . Localized osteoporosis with current pathological fracture with routine healing 01/19/2017  . Wrist fracture 01/16/2017  . Sprain of ankle 03/23/2016  . Closed fracture of lateral malleolus 03/16/2016  . Health care maintenance  01/24/2016  . Blood pressure elevated without history of HTN 10/25/2015  . Essential hypertension 10/25/2015  . Multiple sclerosis ([place]) 10/02/2015  . Chronic left shoulder pain 07/19/2015  . Multiple sclerosis exacerbation ([place]) 07/14/2015  . MS (multiple sclerosis) ([place]) 11/26/2014  . Increased body mass index 11/26/2014  . HPV test positive 11/26/2014  . Status post laparoscopic supracervical hysterectomy 11/26/2014  . Galactorrhea 11/26/2014  . Back ache 05/21/2014  . Adiposity 05/21/2014  . Disordered sleep 05/21/2014  . Muscle spasticity 05/21/2014  . Spasticity 05/21/2014  . Calculus of kidney 12/09/2013  . Renal colic 71/85/5015  . Hypercholesteremia 08/19/2013  . Hereditary and idiopathic neuropathy 08/19/2013  . Hypercholesterolemia without hypertriglyceridemia 08/19/2013  . Bladder infection, chronic 07/25/2012  . Disorder of bladder function 07/25/2012  . Incomplete bladder emptying 07/25/2012  . Microscopic hematuria 07/25/2012  . Right upper quadrant pain 07/25/2012   Janna Arch, PT, DPT   12/16/2019, 1:27 PM  [place] MAIN [facility] SERVICES [address] [place], [place], [address] Phone: [phone]   Fax:  [phone]  Name: Lisa Williams MRN: [id number] Date of Birth: Oct 16, 1960

## 2019-12-18 ENCOUNTER — Ambulatory Visit: Payer: PPO

## 2019-12-18 ENCOUNTER — Other Ambulatory Visit: Payer: Self-pay

## 2019-12-18 DIAGNOSIS — R2681 Unsteadiness on feet: Secondary | ICD-10-CM

## 2019-12-18 DIAGNOSIS — M6281 Muscle weakness (generalized): Secondary | ICD-10-CM

## 2019-12-18 DIAGNOSIS — G35 Multiple sclerosis: Secondary | ICD-10-CM

## 2019-12-18 DIAGNOSIS — R2689 Other abnormalities of gait and mobility: Secondary | ICD-10-CM

## 2019-12-18 NOTE — Therapy (Signed)
[place] MAIN [facility] SERVICES [address] [place], [place], [address] Phone: [phone]   Fax:  [phone]  Physical Therapy Treatment  Patient Details  Name: Selda Jalbert Levengood MRN: [id number] Date of Birth: 1960/10/28 Referring Provider (PT): Gurney Maxin, MD   Encounter Date: 12/18/2019   PT End of Session - 12/18/19 1252    Visit Number 25    Number of Visits 48    Date for PT Re-Evaluation 03/09/20    Authorization Type 5/10 PN 7/27    PT Start Time 1015    PT Stop Time 1055    PT Time Calculation (min) 40 min    Equipment Utilized During Treatment Gait belt;Other (comment)   bilateral AFOs   Activity Tolerance Patient tolerated treatment well    Behavior During Therapy [facility] for tasks assessed/performed           Past Medical History:  Diagnosis Date   Abdominal pain, right upper quadrant    Back pain    Calculus of kidney 12/09/2013   Chronic back pain    unspecified   Chronic left shoulder pain 07/19/2015   Functional disorder of bladder    other   Galactorrhea 11/26/2014   Chronic    Hereditary and idiopathic neuropathy 08/19/2013   HPV test positive    Hypercholesteremia 08/19/2013   Incomplete bladder emptying    Microscopic hematuria    MS (multiple sclerosis) (HCC)    Muscle spasticity 05/21/2014   Nonspecific findings on examination of urine    other   Osteopenia    Status post laparoscopic supracervical hysterectomy 11/26/2014   Tobacco user 11/26/2014   Wrist fracture     Past Surgical History:  Procedure Laterality Date   bilateral tubal ligation  1996   BREAST CYST EXCISION Left 2002   KNEE SURGERY     right   LAPAROSCOPIC SUPRACERVICAL HYSTERECTOMY  08/05/2013   ORIF WRIST FRACTURE Left 01/17/2017   Procedure: OPEN REDUCTION INTERNAL FIXATION (ORIF) WRIST FRACTURE;  Surgeon: Lovell Sheehan, MD;  Location: [facility] ORS;  Service: Orthopedics;  Laterality: Left;   TUBAL  LIGATION Bilateral    VAGINAL HYSTERECTOMY  03/2006    There were no vitals filed for this visit.   Subjective Assessment - 12/18/19 1251    Subjective Patient reports she is getting a new standard walker with front wheels by Drive on Saturday.  Is having difficulty due to the heat index today.    Pertinent History Patient is previous patient of this therapist, was admitted to the hospital on 08/03/19 for MS exacerbation and then went to inpatient rehab on 08/08/19. Patient then received home health therapy and now is returning to outpatient PT. PMh includes HTN, SVT, neurogenic bowel, MS (1995), neuropathy, hx of wrist fx, HPV, Hypercholesteremia, adiposity, osteopenia, spasticity. She wears bilateral AFOs and uses a RW and/or wheelchair for mobility. She drives an adapted car.    Limitations Lifting;Standing;Walking;House hold activities    How long can you sit comfortably? n/a    How long can you stand comfortably? 4 minutes    How long can you walk comfortably? walked 50 ft in inpatient rehab    Patient Stated Goals return to PLOF, increase walking, get into shower, increase mobility    Currently in Pain? No/denies           Patient reports she is getting a new standard walker with front wheels by [address] on Saturday.    Standing in // bars: CGA  with cues for stabilization and safety Weight shift L and R 15x Tandem stance without UE support 20-30 seconds with occasional UE taps to stabilize self, each LE placement Lateral stepping/abduction step 8x each LE, improved foot clearance Marching with BUE support, improved foot clearance bilaterally. 20x     Seated: cues for body mechanics and sequencing for optimal muscle recruitment:    GTB ER 15x; tactile cueing for keeping elbows at side GTB row with PT holding opp end, mod cueing for proper body mechanics 20x GTB overhead to chest pull 20x UE abduction raise,bring forward, then bring down, up, out again for UE and core stability  10x TrA contraction without back support 10x 5 second holds      Patient utilizes ice pack for reduction of internal temperature to reduce risk of MS exacerbation and allow continuation of progression of interventions.     Pt educated throughout session about proper posture and technique with exercises. Improved exercise technique, movement at target joints, use of target muscles after min to mod verbal, visual, tactile cues.                    PT Education - 12/18/19 1252    Education provided Yes    Education Details exercise technique, body mechanics    Person(s) Educated Patient    Methods Explanation;Demonstration;Tactile cues;Verbal cues    Comprehension Verbalized understanding;Returned demonstration;Verbal cues required;Tactile cues required            PT Short Term Goals - 12/16/19 1319      PT SHORT TERM GOAL #1   Title Patient will be independent in home exercise program to improve strength/mobility for better functional independence with ADLs.    Baseline 5/19: HEP next session 6/22: HEP compliant 8/10 : HEP progressions compliant    Time 4    Period Weeks    Status Partially Met    Target Date 01/13/20      PT SHORT TERM GOAL #2   Title Patient will perform a sit to stand with SUE support for increased ind. with transfers.    Baseline 5/19: heavy BUE support 6/22: requires one hand on walker one hand on w/c 8/10: able to perform intermittently (some days can some days cannot)    Time 4    Period Weeks    Status Partially Met    Target Date 01/13/20             PT Long Term Goals - 12/16/19 0001      PT LONG TERM GOAL #1   Title Patient will increase FOTO score to equal to or greater than  53/100   to demonstrate statistically significant improvement in mobility and quality of life.     Baseline 7/27:  48.1% 6/22: 46.99% 5/18: 48/100    Time 12    Period Weeks    Status Partially Met    Target Date 03/09/20      PT LONG TERM GOAL #2    Title Patient (< 68 years old) will complete five times sit to stand test in < 20 seconds indicating an increased LE strength and improved balance.    Baseline 7/27: 26.8 seconds with one partial stand 6/22: 40. 86 sec; unable to reach full stand last two attempts 5/19: 42 seconds     Time 12    Period Weeks    Status Partially Met    Target Date 03/09/20      PT LONG TERM GOAL #3  Title Patient will increase 10 meter walk test to <20 seconds as to improve gait speed for better community ambulation and to reduce fall risk.    Baseline 7/27: 36 seconds with RW 6/22: 36.1 seconds 5/19: 42 seconds    Time 12    Period Weeks    Status Partially Met    Target Date 03/09/20      PT LONG TERM GOAL #4   Title Patient will reduce timed up and go to <11 seconds to reduce fall risk and demonstrate improved transfer/gait ability.    Baseline 7/27: 43.65 6/22: 45.4 seconds 5/19: 47 seconds    Time 12    Period Weeks    Status Partially Met    Target Date 03/09/20      PT LONG TERM GOAL #5   Title Patient will ambulate from physical therapy gym to elevator down the hall, ride elevator to first floor, and walk to valet with RW and chair follow for increased capacity for mobility and ind.    Baseline 8/10: unable to ambulate to elevator without rest break    Time 12    Period Weeks    Status New    Target Date 03/09/20                 Plan - 12/18/19 1254    Clinical Impression Statement Patient demonstrates excellent motivation throughout physical therapy session despite fatigue from high heat index. Patient does require more frequent rest breaks with use of ice due to overheating however continues to progress with strengthening and stability interventions. The patient would benefit from further skilled PT intervention to maximize QOL, safety, and functional mobility    Personal Factors and Comorbidities Age;Comorbidity 3+;Fitness;Past/Current Experience;Sex;Time since onset of  injury/illness/exacerbation    Comorbidities HTN, SVT, neurogenic bowel, MS (1995), neuropathy, hx of wrist fx, HPV, Hypercholesteremia, adiposity, osteopenia, spasticity.    Examination-Activity Limitations Bathing;Bed Mobility;Bend;Carry;Continence;Dressing;Hygiene/Grooming;Stairs;Squat;Reach Overhead;Locomotion Level;Lift;Stand;Transfers    Examination-Participation Restrictions Church;Cleaning;Community Activity;Laundry;Volunteer;Shop;Meal Prep;Yard Work    [employer]chant [employer] officer Evolving/Moderate complexity    Rehab Potential Good    PT Frequency 2x / week    PT Duration 12 weeks    PT Treatment/Interventions ADLs/Self Care Home Management;Aquatic Therapy;Biofeedback;Cryotherapy;Electrical Stimulation;Iontophoresis 72m/ml Dexamethasone;Traction;Moist Heat;Ultrasound;DME Instruction;Gait training;Stair training;Functional mobility training;Therapeutic activities;Therapeutic exercise;Balance training;Neuromuscular re-education;Patient/family education;Orthotic Fit/Training;Wheelchair mobility training;Manual techniques;Compression bandaging;Passive range of motion;Dry needling;Energy conservation;Taping;Splinting;Vestibular    PT Next Visit Plan step negotiation, turning directions to the L and R    PT Home Exercise Plan maintain current HEP from HHPT    Consulted and Agree with Plan of Care Patient           Patient will benefit from skilled therapeutic intervention in order to improve the following deficits and impairments:  Abnormal gait, Decreased activity tolerance, Decreased balance, Decreased coordination, Decreased endurance, Decreased mobility, Decreased range of motion, Decreased strength, Difficulty walking, Impaired perceived functional ability, Impaired sensation, Impaired tone, Impaired UE functional use, Improper body mechanics, Postural dysfunction, Cardiopulmonary status limiting activity, Impaired flexibility, Increased muscle spasms  Visit Diagnosis: Multiple  sclerosis exacerbation (HCC)  Muscle weakness (generalized)  Other abnormalities of gait and mobility  Unsteadiness on feet     Problem List Patient Active Problem List   Diagnosis Date Noted   Hypoalbuminemia due to protein-calorie malnutrition (HCC)    Neurogenic bowel    Neurogenic bladder    Labile blood pressure    Neuropathic pain    Abscess of female pelvis    SVT (supraventricular tachycardia) (HCC)  Radial styloid tenosynovitis 03/12/2018   Wheelchair confinement 02/27/2018   Localized osteoporosis with current pathological fracture with routine healing 01/19/2017   Wrist fracture 01/16/2017   Sprain of ankle 03/23/2016   Closed fracture of lateral malleolus 03/16/2016   Health care maintenance 01/24/2016   Blood pressure elevated without history of HTN 10/25/2015   Essential hypertension 10/25/2015   Multiple sclerosis ([place]) 10/02/2015   Chronic left shoulder pain 07/19/2015   Multiple sclerosis exacerbation ([place]) 07/14/2015   MS (multiple sclerosis) ([place]) 11/26/2014   Increased body mass index 11/26/2014   HPV test positive 11/26/2014   Status post laparoscopic supracervical hysterectomy 11/26/2014   Galactorrhea 11/26/2014   Back ache 05/21/2014   Adiposity 05/21/2014   Disordered sleep 05/21/2014   Muscle spasticity 05/21/2014   Spasticity 05/21/2014   Calculus of kidney 71/29/2909   Renal colic 07/07/4994   Hypercholesteremia 08/19/2013   Hereditary and idiopathic neuropathy 08/19/2013   Hypercholesterolemia without hypertriglyceridemia 08/19/2013   Bladder infection, chronic 07/25/2012   Disorder of bladder function 07/25/2012   Incomplete bladder emptying 07/25/2012   Microscopic hematuria 07/25/2012   Right upper quadrant pain 07/25/2012   Janna Arch, PT, DPT   12/18/2019, 12:55 PM  [place] MAIN [facility] SERVICES [address] [place], [place], [address] Phone:  [phone]   Fax:  [phone]  Name: Sereniti Wan Fratus MRN: [id number] Date of Birth: October 21, 1960

## 2019-12-23 ENCOUNTER — Other Ambulatory Visit: Payer: Self-pay

## 2019-12-23 ENCOUNTER — Ambulatory Visit: Payer: PPO

## 2019-12-23 DIAGNOSIS — G35 Multiple sclerosis: Secondary | ICD-10-CM | POA: Diagnosis not present

## 2019-12-23 DIAGNOSIS — R2689 Other abnormalities of gait and mobility: Secondary | ICD-10-CM

## 2019-12-23 DIAGNOSIS — M6281 Muscle weakness (generalized): Secondary | ICD-10-CM

## 2019-12-23 DIAGNOSIS — R2681 Unsteadiness on feet: Secondary | ICD-10-CM

## 2019-12-23 NOTE — Therapy (Signed)
[place] MAIN [facility] SERVICES [address] [place], [place], [address] Phone: [phone]   Fax:  [phone]  Physical Therapy Treatment  Patient Details  Name: Lisa Williams MRN: [id number] Date of Birth: 1960/10/27 Referring Provider (PT): Gurney Maxin, MD   Encounter Date: 12/23/2019   PT End of Session - 12/23/19 1108    Visit Number 26    Number of Visits 48    Date for PT Re-Evaluation 03/09/20    Authorization Type 6/10 PN 7/27    PT Start Time 1015    PT Stop Time 1057    PT Time Calculation (min) 42 min    Equipment Utilized During Treatment Gait belt;Other (comment)   bilateral AFOs   Activity Tolerance Patient tolerated treatment well    Behavior During Therapy WFL for tasks assessed/performed           Past Medical History:  Diagnosis Date  . Abdominal pain, right upper quadrant   . Back pain   . Calculus of kidney 12/09/2013  . Chronic back pain    unspecified  . Chronic left shoulder pain 07/19/2015  . Functional disorder of bladder    other  . Galactorrhea 11/26/2014   Chronic   . Hereditary and idiopathic neuropathy 08/19/2013  . HPV test positive   . Hypercholesteremia 08/19/2013  . Incomplete bladder emptying   . Microscopic hematuria   . MS (multiple sclerosis) ([place])   . Muscle spasticity 05/21/2014  . Nonspecific findings on examination of urine    other  . Osteopenia   . Status post laparoscopic supracervical hysterectomy 11/26/2014  . Tobacco user 11/26/2014  . Wrist fracture     Past Surgical History:  Procedure Laterality Date  . bilateral tubal ligation  1996  . BREAST CYST EXCISION Left 2002  . KNEE SURGERY     right  . LAPAROSCOPIC SUPRACERVICAL HYSTERECTOMY  08/05/2013  . ORIF WRIST FRACTURE Left 01/17/2017   Procedure: OPEN REDUCTION INTERNAL FIXATION (ORIF) WRIST FRACTURE;  Surgeon: Lovell Sheehan, MD;  Location: [facility] ORS;  Service: Orthopedics;  Laterality: Left;  . TUBAL  LIGATION Bilateral   . VAGINAL HYSTERECTOMY  03/2006    There were no vitals filed for this visit.   Subjective Assessment - 12/23/19 1104    Subjective Patient reports her new walker is not wide enough so had to order a different one. Is switching neurologists in the upcoming months. No falls or LOB since last session.    Pertinent History Patient is previous patient of this therapist, was admitted to the hospital on 08/03/19 for MS exacerbation and then went to inpatient rehab on 08/08/19. Patient then received home health therapy and now is returning to outpatient PT. PMh includes HTN, SVT, neurogenic bowel, MS (1995), neuropathy, hx of wrist fx, HPV, Hypercholesteremia, adiposity, osteopenia, spasticity. She wears bilateral AFOs and uses a RW and/or wheelchair for mobility. She drives an adapted car.    Limitations Lifting;Standing;Walking;House hold activities    How long can you sit comfortably? n/a    How long can you stand comfortably? 4 minutes    How long can you walk comfortably? walked 50 ft in inpatient rehab    Patient Stated Goals return to PLOF, increase walking, get into shower, increase mobility    Currently in Pain? No/denies              Standing in // bars: CGA with cues for stabilization and safety Weight shift L and R 15x  Tandem stance without UE support 20-30 seconds with occasional UE taps to stabilize self, each LE placement Static stand w/o UE support: PVC pipe raises 15x Static stand w/o UE support PVC pipe bicep curl 10x Standing with UE support, step back with one leg, posterior pelvic tilt for anterior hip lengthening 30-60 second hold each LE    Seated: cues for body mechanics and sequencing for optimal muscle recruitment:    Rainbow ball pass to wall with upright posture w/o back support for pertubation, core stability, and reaction timing x 15 Rainbow ball bounce pass to wall with upright posture w/o back support, to increase challenge x 10 very  challenging rainbow ball chest press 10x with adduction squeeze Rainbow ball adduction ball squeeze 15x between knees.  Cross body UE reach down then up in PNF 2 modified pattern for "disco" move x 8 each side, very challenging    Patient utilizes ice pack for reduction of internal temperature to reduce risk of MS exacerbation and allow continuation of progression of interventions.     Pt educated throughout session about proper posture and technique with exercises. Improved exercise technique, movement at target joints, use of target muscles after min to mod verbal, visual, tactile cues.                         PT Education - 12/23/19 1105    Education provided Yes    Education Details exercise technique, body mechanics    Person(s) Educated Patient    Methods Explanation;Demonstration;Tactile cues;Verbal cues    Comprehension Verbalized understanding;Returned demonstration;Verbal cues required;Tactile cues required            PT Short Term Goals - 12/16/19 1319      PT SHORT TERM GOAL #1   Title Patient will be independent in home exercise program to improve strength/mobility for better functional independence with ADLs.    Baseline 5/19: HEP next session 6/22: HEP compliant 8/10 : HEP progressions compliant    Time 4    Period Weeks    Status Partially Met    Target Date 01/13/20      PT SHORT TERM GOAL #2   Title Patient will perform a sit to stand with SUE support for increased ind. with transfers.    Baseline 5/19: heavy BUE support 6/22: requires one hand on walker one hand on w/c 8/10: able to perform intermittently (some days can some days cannot)    Time 4    Period Weeks    Status Partially Met    Target Date 01/13/20             PT Long Term Goals - 12/16/19 0001      PT LONG TERM GOAL #1   Title Patient will increase FOTO score to equal to or greater than  53/100   to demonstrate statistically significant improvement in mobility and  quality of life.     Baseline 7/27:  48.1% 6/22: 46.99% 5/18: 48/100    Time 12    Period Weeks    Status Partially Met    Target Date 03/09/20      PT LONG TERM GOAL #2   Title Patient (< 53 years old) will complete five times sit to stand test in < 20 seconds indicating an increased LE strength and improved balance.    Baseline 7/27: 26.8 seconds with one partial stand 6/22: 40. 86 sec; unable to reach full stand last two attempts 5/19: 42  seconds     Time 12    Period Weeks    Status Partially Met    Target Date 03/09/20      PT LONG TERM GOAL #3   Title Patient will increase 10 meter walk test to <20 seconds as to improve gait speed for better community ambulation and to reduce fall risk.    Baseline 7/27: 36 seconds with RW 6/22: 36.1 seconds 5/19: 42 seconds    Time 12    Period Weeks    Status Partially Met    Target Date 03/09/20      PT LONG TERM GOAL #4   Title Patient will reduce timed up and go to <11 seconds to reduce fall risk and demonstrate improved transfer/gait ability.    Baseline 7/27: 43.65 6/22: 45.4 seconds 5/19: 47 seconds    Time 12    Period Weeks    Status Partially Met    Target Date 03/09/20      PT LONG TERM GOAL #5   Title Patient will ambulate from physical therapy gym to elevator down the hall, ride elevator to first floor, and walk to valet with RW and chair follow for increased capacity for mobility and ind.    Baseline 8/10: unable to ambulate to elevator without rest break    Time 12    Period Weeks    Status New    Target Date 03/09/20                 Plan - 12/23/19 1352    Clinical Impression Statement Patient continues to progress with stabilization with decreased UE support with increased tolerance of intervention duration. Standing lengthening of hip flexors are challenging for patient and will be a continued area for advancement/focus. Core stability and reaction timing are challenging but progressing in tolerance. The  patient would benefit from further skilled PT intervention to maximize QOL, safety, and functional mobility    Personal Factors and Comorbidities Age;Comorbidity 3+;Fitness;Past/Current Experience;Sex;Time since onset of injury/illness/exacerbation    Comorbidities HTN, SVT, neurogenic bowel, MS (1995), neuropathy, hx of wrist fx, HPV, Hypercholesteremia, adiposity, osteopenia, spasticity.    Examination-Activity Limitations Bathing;Bed Mobility;Bend;Carry;Continence;Dressing;Hygiene/Grooming;Stairs;Squat;Reach Overhead;Locomotion Level;Lift;Stand;Transfers    Examination-Participation Restrictions Church;Cleaning;Community Activity;Laundry;Volunteer;Shop;Meal Prep;Yard Work    [employer]chant [employer] officer Evolving/Moderate complexity    Rehab Potential Good    PT Frequency 2x / week    PT Duration 12 weeks    PT Treatment/Interventions ADLs/Self Care Home Management;Aquatic Therapy;Biofeedback;Cryotherapy;Electrical Stimulation;Iontophoresis 32m/ml Dexamethasone;Traction;Moist Heat;Ultrasound;DME Instruction;Gait training;Stair training;Functional mobility training;Therapeutic activities;Therapeutic exercise;Balance training;Neuromuscular re-education;Patient/family education;Orthotic Fit/Training;Wheelchair mobility training;Manual techniques;Compression bandaging;Passive range of motion;Dry needling;Energy conservation;Taping;Splinting;Vestibular    PT Next Visit Plan step negotiation, turning directions to the L and R    PT Home Exercise Plan maintain current HEP from HHPT    Consulted and Agree with Plan of Care Patient           Patient will benefit from skilled therapeutic intervention in order to improve the following deficits and impairments:  Abnormal gait, Decreased activity tolerance, Decreased balance, Decreased coordination, Decreased endurance, Decreased mobility, Decreased range of motion, Decreased strength, Difficulty walking, Impaired perceived functional ability, Impaired  sensation, Impaired tone, Impaired UE functional use, Improper body mechanics, Postural dysfunction, Cardiopulmonary status limiting activity, Impaired flexibility, Increased muscle spasms  Visit Diagnosis: Multiple sclerosis exacerbation (HCC)  Muscle weakness (generalized)  Other abnormalities of gait and mobility  Unsteadiness on feet     Problem List Patient Active Problem List   Diagnosis Date Noted  . Hypoalbuminemia  due to protein-calorie malnutrition ([place])   . Neurogenic bowel   . Neurogenic bladder   . Labile blood pressure   . Neuropathic pain   . Abscess of female pelvis   . SVT (supraventricular tachycardia) ([place])   . Radial styloid tenosynovitis 03/12/2018  . Wheelchair confinement 02/27/2018  . Localized osteoporosis with current pathological fracture with routine healing 01/19/2017  . Wrist fracture 01/16/2017  . Sprain of ankle 03/23/2016  . Closed fracture of lateral malleolus 03/16/2016  . Health care maintenance 01/24/2016  . Blood pressure elevated without history of HTN 10/25/2015  . Essential hypertension 10/25/2015  . Multiple sclerosis ([place]) 10/02/2015  . Chronic left shoulder pain 07/19/2015  . Multiple sclerosis exacerbation ([place]) 07/14/2015  . MS (multiple sclerosis) ([place]) 11/26/2014  . Increased body mass index 11/26/2014  . HPV test positive 11/26/2014  . Status post laparoscopic supracervical hysterectomy 11/26/2014  . Galactorrhea 11/26/2014  . Back ache 05/21/2014  . Adiposity 05/21/2014  . Disordered sleep 05/21/2014  . Muscle spasticity 05/21/2014  . Spasticity 05/21/2014  . Calculus of kidney 12/09/2013  . Renal colic 91/06/8900  . Hypercholesteremia 08/19/2013  . Hereditary and idiopathic neuropathy 08/19/2013  . Hypercholesterolemia without hypertriglyceridemia 08/19/2013  . Bladder infection, chronic 07/25/2012  . Disorder of bladder function 07/25/2012  . Incomplete bladder emptying 07/25/2012  . Microscopic hematuria  07/25/2012  . Right upper quadrant pain 07/25/2012   Janna Arch, PT, DPT   12/23/2019, 1:53 PM  [place] MAIN [facility] SERVICES [address] [place], [place], [address] Phone: [phone]   Fax:  [phone]  Name: Lisa Williams MRN: [id number] Date of Birth: [birth year]

## 2019-12-25 ENCOUNTER — Ambulatory Visit: Payer: PPO

## 2019-12-25 ENCOUNTER — Other Ambulatory Visit: Payer: Self-pay

## 2019-12-25 DIAGNOSIS — M6281 Muscle weakness (generalized): Secondary | ICD-10-CM

## 2019-12-25 DIAGNOSIS — G35 Multiple sclerosis: Secondary | ICD-10-CM

## 2019-12-25 DIAGNOSIS — R2689 Other abnormalities of gait and mobility: Secondary | ICD-10-CM

## 2019-12-25 DIAGNOSIS — R2681 Unsteadiness on feet: Secondary | ICD-10-CM

## 2019-12-25 NOTE — Therapy (Signed)
[place] MAIN [facility] SERVICES [address] [place], [place], [address] Phone: [phone]   Fax:  [phone]  Physical Therapy Treatment  Patient Details  Name: Lisa Williams MRN: [id number] Date of Birth: April 26, 1961 Referring Provider (PT): Gurney Maxin, MD   Encounter Date: 12/25/2019   PT End of Session - 12/25/19 1136    Visit Number 27    Number of Visits 48    Date for PT Re-Evaluation 03/09/20    Authorization Type 7/10 PN 7/27    PT Start Time 1015    PT Stop Time 1053    PT Time Calculation (min) 38 min    Equipment Utilized During Treatment Gait belt;Other (comment)   bilateral AFOs   Activity Tolerance Patient tolerated treatment well    Behavior During Therapy [facility] for tasks assessed/performed           Past Medical History:  Diagnosis Date   Abdominal pain, right upper quadrant    Back pain    Calculus of kidney 12/09/2013   Chronic back pain    unspecified   Chronic left shoulder pain 07/19/2015   Functional disorder of bladder    other   Galactorrhea 11/26/2014   Chronic    Hereditary and idiopathic neuropathy 08/19/2013   HPV test positive    Hypercholesteremia 08/19/2013   Incomplete bladder emptying    Microscopic hematuria    MS (multiple sclerosis) (HCC)    Muscle spasticity 05/21/2014   Nonspecific findings on examination of urine    other   Osteopenia    Status post laparoscopic supracervical hysterectomy 11/26/2014   Tobacco user 11/26/2014   Wrist fracture     Past Surgical History:  Procedure Laterality Date   bilateral tubal ligation  1996   BREAST CYST EXCISION Left 2002   KNEE SURGERY     right   LAPAROSCOPIC SUPRACERVICAL HYSTERECTOMY  08/05/2013   ORIF WRIST FRACTURE Left 01/17/2017   Procedure: OPEN REDUCTION INTERNAL FIXATION (ORIF) WRIST FRACTURE;  Surgeon: Lovell Sheehan, MD;  Location: [facility] ORS;  Service: Orthopedics;  Laterality: Left;   TUBAL  LIGATION Bilateral    VAGINAL HYSTERECTOMY  03/2006    There were no vitals filed for this visit.   Subjective Assessment - 12/25/19 1135    Subjective Patient reports waking up with a new back pain that is limiting and diffuse fatigue as well as a headache.    Pertinent History Patient is previous patient of this therapist, was admitted to the hospital on 08/03/19 for MS exacerbation and then went to inpatient rehab on 08/08/19. Patient then received home health therapy and now is returning to outpatient PT. PMh includes HTN, SVT, neurogenic bowel, MS (1995), neuropathy, hx of wrist fx, HPV, Hypercholesteremia, adiposity, osteopenia, spasticity. She wears bilateral AFOs and uses a RW and/or wheelchair for mobility. She drives an adapted car.    Limitations Lifting;Standing;Walking;House hold activities    How long can you sit comfortably? n/a    How long can you stand comfortably? 4 minutes    How long can you walk comfortably? walked 50 ft in inpatient rehab    Patient Stated Goals return to PLOF, increase walking, get into shower, increase mobility    Currently in Pain? Yes    Pain Score 6     Pain Location Back    Pain Orientation Lower    Pain Descriptors / Indicators Aching;Stabbing    Pain Type Acute pain    Pain Onset Today  Pain Frequency Constant    Aggravating Factors  standing, walking, transfers    Pain Relieving Factors rest    Effect of Pain on Daily Activities limits mobility             Treatment:  Ambulate with RW with CGA and w/c follow, increased episodes of excessive forward trunk flexion due to pain in low back 60 ft. Adjustment of height of walker unsuccessful in reducing pain.   seated on plinth table with support on walker: STM to bilateral quadratus and lumbar paraspinals with implementation of effleurage and pettrisage for pain reduction x 9 minutes  Seated in wheelchair: Trunk extension with arms crossed 10x; pain relieving with repetition    Posterior pelvic tilt with 3 second holds, 12x very challenging for patient TrA activation 10x 3 second holds, cues for breathing and core activation Pectoral stretch with external rotation and UE horizontal abduction 15x   stand pivot transfer x2 with cues for sequencing, safety awareness and body mechanics    Patient utilizes ice pack for reduction of internal temperature to reduce risk of MS exacerbation and allow continuation of progression of interventions.     Pt educated throughout session about proper posture and technique with exercises. Improved exercise technique, movement at target joints, use of target muscles after min to mod verbal, visual, tactile cues.                    PT Education - 12/25/19 1136    Education provided Yes    Education Details pain reduction techniques    Person(s) Educated Patient    Methods Explanation;Demonstration;Tactile cues;Verbal cues    Comprehension Verbalized understanding;Returned demonstration;Verbal cues required;Tactile cues required            PT Short Term Goals - 12/16/19 1319      PT SHORT TERM GOAL #1   Title Patient will be independent in home exercise program to improve strength/mobility for better functional independence with ADLs.    Baseline 5/19: HEP next session 6/22: HEP compliant 8/10 : HEP progressions compliant    Time 4    Period Weeks    Status Partially Met    Target Date 01/13/20      PT SHORT TERM GOAL #2   Title Patient will perform a sit to stand with SUE support for increased ind. with transfers.    Baseline 5/19: heavy BUE support 6/22: requires one hand on walker one hand on w/c 8/10: able to perform intermittently (some days can some days cannot)    Time 4    Period Weeks    Status Partially Met    Target Date 01/13/20             PT Long Term Goals - 12/16/19 0001      PT LONG TERM GOAL #1   Title Patient will increase FOTO score to equal to or greater than  53/100   to  demonstrate statistically significant improvement in mobility and quality of life.     Baseline 7/27:  48.1% 6/22: 46.99% 5/18: 48/100    Time 12    Period Weeks    Status Partially Met    Target Date 03/09/20      PT LONG TERM GOAL #2   Title Patient (< 56 years old) will complete five times sit to stand test in < 20 seconds indicating an increased LE strength and improved balance.    Baseline 7/27: 26.8 seconds with one partial stand 6/22:  40. 86 sec; unable to reach full stand last two attempts 5/19: 42 seconds     Time 12    Period Weeks    Status Partially Met    Target Date 03/09/20      PT LONG TERM GOAL #3   Title Patient will increase 10 meter walk test to <20 seconds as to improve gait speed for better community ambulation and to reduce fall risk.    Baseline 7/27: 36 seconds with RW 6/22: 36.1 seconds 5/19: 42 seconds    Time 12    Period Weeks    Status Partially Met    Target Date 03/09/20      PT LONG TERM GOAL #4   Title Patient will reduce timed up and go to <11 seconds to reduce fall risk and demonstrate improved transfer/gait ability.    Baseline 7/27: 43.65 6/22: 45.4 seconds 5/19: 47 seconds    Time 12    Period Weeks    Status Partially Met    Target Date 03/09/20      PT LONG TERM GOAL #5   Title Patient will ambulate from physical therapy gym to elevator down the hall, ride elevator to first floor, and walk to valet with RW and chair follow for increased capacity for mobility and ind.    Baseline 8/10: unable to ambulate to elevator without rest break    Time 12    Period Weeks    Status New    Target Date 03/09/20                 Plan - 12/25/19 1137    Clinical Impression Statement Patient's session limited by low back pain, utilization of manual and therex was successful in reducing pain per patient report. Core strengthening and extension repeated motions reduced symptoms with patient demonstrating understanding for self carryover between  sessions as needed. The patient would benefit from further skilled PT intervention to maximize QOL, safety, and functional mobility    Personal Factors and Comorbidities Age;Comorbidity 3+;Fitness;Past/Current Experience;Sex;Time since onset of injury/illness/exacerbation    Comorbidities HTN, SVT, neurogenic bowel, MS (1995), neuropathy, hx of wrist fx, HPV, Hypercholesteremia, adiposity, osteopenia, spasticity.    Examination-Activity Limitations Bathing;Bed Mobility;Bend;Carry;Continence;Dressing;Hygiene/Grooming;Stairs;Squat;Reach Overhead;Locomotion Level;Lift;Stand;Transfers    Examination-Participation Restrictions Church;Cleaning;Community Activity;Laundry;Volunteer;Shop;Meal Prep;Yard Work    [employer] officer Evolving/Moderate complexity    Rehab Potential Good    PT Frequency 2x / week    PT Duration 12 weeks    PT Treatment/Interventions ADLs/Self Care Home Management;Aquatic Therapy;Biofeedback;Cryotherapy;Electrical Stimulation;Iontophoresis 4m/ml Dexamethasone;Traction;Moist Heat;Ultrasound;DME Instruction;Gait training;Stair training;Functional mobility training;Therapeutic activities;Therapeutic exercise;Balance training;Neuromuscular re-education;Patient/family education;Orthotic Fit/Training;Wheelchair mobility training;Manual techniques;Compression bandaging;Passive range of motion;Dry needling;Energy conservation;Taping;Splinting;Vestibular    PT Next Visit Plan step negotiation, turning directions to the L and R    PT Home Exercise Plan maintain current HEP from HHPT    Consulted and Agree with Plan of Care Patient           Patient will benefit from skilled therapeutic intervention in order to improve the following deficits and impairments:  Abnormal gait, Decreased activity tolerance, Decreased balance, Decreased coordination, Decreased endurance, Decreased mobility, Decreased range of motion, Decreased strength, Difficulty walking, Impaired perceived  functional ability, Impaired sensation, Impaired tone, Impaired UE functional use, Improper body mechanics, Postural dysfunction, Cardiopulmonary status limiting activity, Impaired flexibility, Increased muscle spasms  Visit Diagnosis: Multiple sclerosis exacerbation (HCC)  Muscle weakness (generalized)  Other abnormalities of gait and mobility  Unsteadiness on feet     Problem List Patient Active Problem List  Diagnosis Date Noted   Hypoalbuminemia due to protein-calorie malnutrition (HCC)    Neurogenic bowel    Neurogenic bladder    Labile blood pressure    Neuropathic pain    Abscess of female pelvis    SVT (supraventricular tachycardia) (HCC)    Radial styloid tenosynovitis 03/12/2018   Wheelchair confinement 02/27/2018   Localized osteoporosis with current pathological fracture with routine healing 01/19/2017   Wrist fracture 01/16/2017   Sprain of ankle 03/23/2016   Closed fracture of lateral malleolus 03/16/2016   Health care maintenance 01/24/2016   Blood pressure elevated without history of HTN 10/25/2015   Essential hypertension 10/25/2015   Multiple sclerosis ([place]) 10/02/2015   Chronic left shoulder pain 07/19/2015   Multiple sclerosis exacerbation ([place]) 07/14/2015   MS (multiple sclerosis) ([place]) 11/26/2014   Increased body mass index 11/26/2014   HPV test positive 11/26/2014   Status post laparoscopic supracervical hysterectomy 11/26/2014   Galactorrhea 11/26/2014   Back ache 05/21/2014   Adiposity 05/21/2014   Disordered sleep 05/21/2014   Muscle spasticity 05/21/2014   Spasticity 05/21/2014   Calculus of kidney 28/20/8138   Renal colic 87/19/5974   Hypercholesteremia 08/19/2013   Hereditary and idiopathic neuropathy 08/19/2013   Hypercholesterolemia without hypertriglyceridemia 08/19/2013   Bladder infection, chronic 07/25/2012   Disorder of bladder function 07/25/2012   Incomplete bladder emptying 07/25/2012    Microscopic hematuria 07/25/2012   Right upper quadrant pain 07/25/2012   Janna Arch, PT, DPT   12/25/2019, 11:39 AM  [place] MAIN [facility] SERVICES [address] [place], [place], [address] Phone: [phone]   Fax:  [phone]  Name: Lisa Williams MRN: [id number] Date of Birth: 1960-09-29

## 2019-12-30 ENCOUNTER — Ambulatory Visit: Payer: PPO

## 2019-12-30 ENCOUNTER — Other Ambulatory Visit: Payer: Self-pay

## 2019-12-30 DIAGNOSIS — R2689 Other abnormalities of gait and mobility: Secondary | ICD-10-CM

## 2019-12-30 DIAGNOSIS — R2681 Unsteadiness on feet: Secondary | ICD-10-CM

## 2019-12-30 DIAGNOSIS — G35 Multiple sclerosis: Secondary | ICD-10-CM | POA: Diagnosis not present

## 2019-12-30 DIAGNOSIS — M6281 Muscle weakness (generalized): Secondary | ICD-10-CM

## 2019-12-30 NOTE — Therapy (Signed)
[place] MAIN [facility] SERVICES [address] [place], [place], [address] Phone: [phone]   Fax:  [phone]  Physical Therapy Treatment  Patient Details  Name: Lisa Williams MRN: [id number] Date of Birth: [birth year] Referring Provider (PT): Gurney Maxin, MD   Encounter Date: 12/30/2019   PT End of Session - 12/30/19 1316    Visit Number 28    Number of Visits 48    Date for PT Re-Evaluation 03/09/20    Authorization Type 8/10 PN 7/27    PT Start Time 8850    PT Stop Time 1058    PT Time Calculation (min) 43 min    Equipment Utilized During Treatment Gait belt;Other (comment)   bilateral AFOs   Activity Tolerance Patient tolerated treatment well    Behavior During Therapy [facility] for tasks assessed/performed           Past Medical History:  Diagnosis Date   Abdominal pain, right upper quadrant    Back pain    Calculus of kidney 12/09/2013   Chronic back pain    unspecified   Chronic left shoulder pain 07/19/2015   Functional disorder of bladder    other   Galactorrhea 11/26/2014   Chronic    Hereditary and idiopathic neuropathy 08/19/2013   HPV test positive    Hypercholesteremia 08/19/2013   Incomplete bladder emptying    Microscopic hematuria    MS (multiple sclerosis) (HCC)    Muscle spasticity 05/21/2014   Nonspecific findings on examination of urine    other   Osteopenia    Status post laparoscopic supracervical hysterectomy 11/26/2014   Tobacco user 11/26/2014   Wrist fracture     Past Surgical History:  Procedure Laterality Date   bilateral tubal ligation  1996   BREAST CYST EXCISION Left 2002   KNEE SURGERY     right   LAPAROSCOPIC SUPRACERVICAL HYSTERECTOMY  08/05/2013   ORIF WRIST FRACTURE Left 01/17/2017   Procedure: OPEN REDUCTION INTERNAL FIXATION (ORIF) WRIST FRACTURE;  Surgeon: Lovell Sheehan, MD;  Location: [facility] ORS;  Service: Orthopedics;  Laterality: Left;   TUBAL  LIGATION Bilateral    VAGINAL HYSTERECTOMY  03/2006    There were no vitals filed for this visit.   Subjective Assessment - 12/30/19 1315    Subjective Patient forgot her neck cooler today. No falls or LOB since last session.    Pertinent History Patient is previous patient of this therapist, was admitted to the hospital on 08/03/19 for MS exacerbation and then went to inpatient rehab on 08/08/19. Patient then received home health therapy and now is returning to outpatient PT. PMh includes HTN, SVT, neurogenic bowel, MS (1995), neuropathy, hx of wrist fx, HPV, Hypercholesteremia, adiposity, osteopenia, spasticity. She wears bilateral AFOs and uses a RW and/or wheelchair for mobility. She drives an adapted car.    Limitations Lifting;Standing;Walking;House hold activities    How long can you sit comfortably? n/a    How long can you stand comfortably? 4 minutes    How long can you walk comfortably? walked 50 ft in inpatient rehab    Patient Stated Goals return to PLOF, increase walking, get into shower, increase mobility    Currently in Pain? No/denies                 Standing in // bars with CGA and cues for body mechanics and safety: -standing weight shift 10x each side -static stand and toss nonweighted small ball to/from with SPT x 12  with occasional need for UE support with excessive pertubations -static stand with UE clapping for reach and sequencing with dual task x 60 seconds  -lateral stepping 5x each LE, no rest break required between LE changes -modified bilateral knee flexion/mini squats with w/c behind x 5    Seated: cues for body mechanics and sequencing for optimal muscle recruitment:   -Reach outside BOS grab a basketball, sit upright without support and throw into hoop x 4 minutes for pertubation's, core stabilization, and righting  -Posterior pelvic tilt with 3 second holds, 12x very challenging for patient -Pectoral stretch with external rotation and UE horizontal  abduction 15x  -forward lean with reach to ceiling, open arm abduction and return to resting position x 10 -modified lateral reach with "airplane" arm abduction 10x each side cues for core stabilization while returning to COM -weighted ball (2000 gr) straight arm raise 10x, weighted ball toss 10x   isometric contraction against PT resistance: -hip flexion 10x 3 second hold -hip abduction 10x 3 second hold -hip adduction 10x 3 second holds    Patient utilizes ice pack for reduction of internal temperature to reduce risk of MS exacerbation and allow continuation of progression of interventions.     Pt educated throughout session about proper posture and technique with exercises. Improved exercise technique, movement at target joints, use of target muscles after min to mod verbal, visual, tactile cues.                   PT Education - 12/30/19 1315    Education provided Yes    Education Details exercise technique, body mechanics    Person(s) Educated Patient    Methods Explanation;Demonstration;Tactile cues;Verbal cues    Comprehension Verbalized understanding;Returned demonstration;Verbal cues required;Tactile cues required            PT Short Term Goals - 12/16/19 1319      PT SHORT TERM GOAL #1   Title Patient will be independent in home exercise program to improve strength/mobility for better functional independence with ADLs.    Baseline 5/19: HEP next session 6/22: HEP compliant 8/10 : HEP progressions compliant    Time 4    Period Weeks    Status Partially Met    Target Date 01/13/20      PT SHORT TERM GOAL #2   Title Patient will perform a sit to stand with SUE support for increased ind. with transfers.    Baseline 5/19: heavy BUE support 6/22: requires one hand on walker one hand on w/c 8/10: able to perform intermittently (some days can some days cannot)    Time 4    Period Weeks    Status Partially Met    Target Date 01/13/20             PT Long  Term Goals - 12/16/19 0001      PT LONG TERM GOAL #1   Title Patient will increase FOTO score to equal to or greater than  53/100   to demonstrate statistically significant improvement in mobility and quality of life.     Baseline 7/27:  48.1% 6/22: 46.99% 5/18: 48/100    Time 12    Period Weeks    Status Partially Met    Target Date 03/09/20      PT LONG TERM GOAL #2   Title Patient (< 69 years old) will complete five times sit to stand test in < 20 seconds indicating an increased LE strength and improved balance.  Baseline 7/27: 26.8 seconds with one partial stand 6/22: 40. 86 sec; unable to reach full stand last two attempts 5/19: 42 seconds     Time 12    Period Weeks    Status Partially Met    Target Date 03/09/20      PT LONG TERM GOAL #3   Title Patient will increase 10 meter walk test to <20 seconds as to improve gait speed for better community ambulation and to reduce fall risk.    Baseline 7/27: 36 seconds with RW 6/22: 36.1 seconds 5/19: 42 seconds    Time 12    Period Weeks    Status Partially Met    Target Date 03/09/20      PT LONG TERM GOAL #4   Title Patient will reduce timed up and go to <11 seconds to reduce fall risk and demonstrate improved transfer/gait ability.    Baseline 7/27: 43.65 6/22: 45.4 seconds 5/19: 47 seconds    Time 12    Period Weeks    Status Partially Met    Target Date 03/09/20      PT LONG TERM GOAL #5   Title Patient will ambulate from physical therapy gym to elevator down the hall, ride elevator to first floor, and walk to valet with RW and chair follow for increased capacity for mobility and ind.    Baseline 8/10: unable to ambulate to elevator without rest break    Time 12    Period Weeks    Status New    Target Date 03/09/20                 Plan - 12/30/19 1316    Clinical Impression Statement Patient presents with excellent motivation despite increased temperatures and affect of heat on patient. Improved static  standing tolerance as well as ability to weight shift with decreasing reliance upon UE's noted. Seated interventions focused on core activation progressed. The patient would benefit from further skilled PT intervention to maximize QOL, safety, and functional mobility    Personal Factors and Comorbidities Age;Comorbidity 3+;Fitness;Past/Current Experience;Sex;Time since onset of injury/illness/exacerbation    Comorbidities HTN, SVT, neurogenic bowel, MS (1995), neuropathy, hx of wrist fx, HPV, Hypercholesteremia, adiposity, osteopenia, spasticity.    Examination-Activity Limitations Bathing;Bed Mobility;Bend;Carry;Continence;Dressing;Hygiene/Grooming;Stairs;Squat;Reach Overhead;Locomotion Level;Lift;Stand;Transfers    Examination-Participation Restrictions Church;Cleaning;Community Activity;Laundry;Volunteer;Shop;Meal Prep;Yard Work    [employer]chant [employer] officer Evolving/Moderate complexity    Rehab Potential Good    PT Frequency 2x / week    PT Duration 12 weeks    PT Treatment/Interventions ADLs/Self Care Home Management;Aquatic Therapy;Biofeedback;Cryotherapy;Electrical Stimulation;Iontophoresis 4mg /ml Dexamethasone;Traction;Moist Heat;Ultrasound;DME Instruction;Gait training;Stair training;Functional mobility training;Therapeutic activities;Therapeutic exercise;Balance training;Neuromuscular re-education;Patient/family education;Orthotic Fit/Training;Wheelchair mobility training;Manual techniques;Compression bandaging;Passive range of motion;Dry needling;Energy conservation;Taping;Splinting;Vestibular    PT Next Visit Plan step negotiation, turning directions to the L and R    PT Home Exercise Plan maintain current HEP from HHPT    Consulted and Agree with Plan of Care Patient           Patient will benefit from skilled therapeutic intervention in order to improve the following deficits and impairments:  Abnormal gait, Decreased activity tolerance, Decreased balance, Decreased  coordination, Decreased endurance, Decreased mobility, Decreased range of motion, Decreased strength, Difficulty walking, Impaired perceived functional ability, Impaired sensation, Impaired tone, Impaired UE functional use, Improper body mechanics, Postural dysfunction, Cardiopulmonary status limiting activity, Impaired flexibility, Increased muscle spasms  Visit Diagnosis: Multiple sclerosis exacerbation (HCC)  Muscle weakness (generalized)  Other abnormalities of gait and mobility  Unsteadiness on feet  Problem List Patient Active Problem List   Diagnosis Date Noted   Hypoalbuminemia due to protein-calorie malnutrition ([place])    Neurogenic bowel    Neurogenic bladder    Labile blood pressure    Neuropathic pain    Abscess of female pelvis    SVT (supraventricular tachycardia) (HCC)    Radial styloid tenosynovitis 03/12/2018   Wheelchair confinement 02/27/2018   Localized osteoporosis with current pathological fracture with routine healing 01/19/2017   Wrist fracture 01/16/2017   Sprain of ankle 03/23/2016   Closed fracture of lateral malleolus 03/16/2016   Health care maintenance 01/24/2016   Blood pressure elevated without history of HTN 10/25/2015   Essential hypertension 10/25/2015   Multiple sclerosis ([place]) 10/02/2015   Chronic left shoulder pain 07/19/2015   Multiple sclerosis exacerbation ([place]) 07/14/2015   MS (multiple sclerosis) ([place]) 11/26/2014   Increased body mass index 11/26/2014   HPV test positive 11/26/2014   Status post laparoscopic supracervical hysterectomy 11/26/2014   Galactorrhea 11/26/2014   Back ache 05/21/2014   Adiposity 05/21/2014   Disordered sleep 05/21/2014   Muscle spasticity 05/21/2014   Spasticity 05/21/2014   Calculus of kidney 36/04/2448   Renal colic 75/30/0511   Hypercholesteremia 08/19/2013   Hereditary and idiopathic neuropathy 08/19/2013   Hypercholesterolemia without hypertriglyceridemia  08/19/2013   Bladder infection, chronic 07/25/2012   Disorder of bladder function 07/25/2012   Incomplete bladder emptying 07/25/2012   Microscopic hematuria 07/25/2012   Right upper quadrant pain 07/25/2012   Janna Arch, PT, DPT   12/30/2019, 1:19 PM  [place] [facility] MAIN [facility] SERVICES [place], [place], [address] Phone: [phone]   Fax:  [phone]  Name: Lisa [place] MRN: [id number] Date of Birth: 12/14/1960

## 2020-01-01 ENCOUNTER — Ambulatory Visit: Payer: PPO

## 2020-01-01 ENCOUNTER — Other Ambulatory Visit: Payer: Self-pay

## 2020-01-01 DIAGNOSIS — R2681 Unsteadiness on feet: Secondary | ICD-10-CM

## 2020-01-01 DIAGNOSIS — G35 Multiple sclerosis: Secondary | ICD-10-CM | POA: Diagnosis not present

## 2020-01-01 DIAGNOSIS — M6281 Muscle weakness (generalized): Secondary | ICD-10-CM

## 2020-01-01 DIAGNOSIS — R2689 Other abnormalities of gait and mobility: Secondary | ICD-10-CM

## 2020-01-01 NOTE — Therapy (Signed)
[place] MAIN [facility] SERVICES [address] [place], [place], [address] Phone: [phone]   Fax:  [phone]  Physical Therapy Treatment  Patient Details  Name: Lisa Williams MRN: [id number] Date of Birth: Oct 02, 1960 Referring Provider (PT): Gurney Maxin, MD   Encounter Date: 01/01/2020   PT End of Session - 01/01/20 1155    Visit Number 29    Number of Visits 48    Date for PT Re-Evaluation 03/09/20    Authorization Type 9/10 PN 7/27    PT Start Time 1020    PT Stop Time 1104    PT Time Calculation (min) 44 min    Equipment Utilized During Treatment Gait belt;Other (comment)   bilateral AFOs   Activity Tolerance Patient tolerated treatment well    Behavior During Therapy WFL for tasks assessed/performed           Past Medical History:  Diagnosis Date  . Abdominal pain, right upper quadrant   . Back pain   . Calculus of kidney 12/09/2013  . Chronic back pain    unspecified  . Chronic left shoulder pain 07/19/2015  . Functional disorder of bladder    other  . Galactorrhea 11/26/2014   Chronic   . Hereditary and idiopathic neuropathy 08/19/2013  . HPV test positive   . Hypercholesteremia 08/19/2013  . Incomplete bladder emptying   . Microscopic hematuria   . MS (multiple sclerosis) ([place])   . Muscle spasticity 05/21/2014  . Nonspecific findings on examination of urine    other  . Osteopenia   . Status post laparoscopic supracervical hysterectomy 11/26/2014  . Tobacco user 11/26/2014  . Wrist fracture     Past Surgical History:  Procedure Laterality Date  . bilateral tubal ligation  1996  . BREAST CYST EXCISION Left 2002  . KNEE SURGERY     right  . LAPAROSCOPIC SUPRACERVICAL HYSTERECTOMY  08/05/2013  . ORIF WRIST FRACTURE Left 01/17/2017   Procedure: OPEN REDUCTION INTERNAL FIXATION (ORIF) WRIST FRACTURE;  Surgeon: Lovell Sheehan, MD;  Location: [facility] ORS;  Service: Orthopedics;  Laterality: Left;  . TUBAL  LIGATION Bilateral   . VAGINAL HYSTERECTOMY  03/2006    There were no vitals filed for this visit.   Subjective Assessment - 01/01/20 1154    Subjective Patient presents with neck cooler and new walker. Had an upsetting experience prior to PT session however is eager to participate in session.    Pertinent History Patient is previous patient of this therapist, was admitted to the hospital on 08/03/19 for MS exacerbation and then went to inpatient rehab on 08/08/19. Patient then received home health therapy and now is returning to outpatient PT. PMh includes HTN, SVT, neurogenic bowel, MS (1995), neuropathy, hx of wrist fx, HPV, Hypercholesteremia, adiposity, osteopenia, spasticity. She wears bilateral AFOs and uses a RW and/or wheelchair for mobility. She drives an adapted car.    Limitations Lifting;Standing;Walking;House hold activities    How long can you sit comfortably? n/a    How long can you stand comfortably? 4 minutes    How long can you walk comfortably? walked 50 ft in inpatient rehab    Patient Stated Goals return to PLOF, increase walking, get into shower, increase mobility    Currently in Pain? No/denies                 Ambulate59f with RW and wheelchair follow with CGA, cues for foot clearance and sequencing. Sat and rest and then ambulated an  additional 65 ft with good increased step length and weight shift.   Standing static w/o UE support 2x 15 seconds   Seated: cues for body mechanics and sequencing for optimal muscle recruitment:  Seated boxing: cues for sequencing, body mechanics, and pace/rhythm for functional contraction and timing of muscle recruitment: -Cross body punches to mitts on PT hands with no back support for focused core stabilization with crossing midline 2x 60 seconds  -Cross body punch with secondary combination of elbow for full cross body rotation to PT mitt for trunk stability, coordination, and cardiovascular challenge x 60 seconds -Upper cut  to mitts in PT hands for core activation without back support with perturbations 60 seconds  3lb dumbbell: -row 15x -abduction to flexion to back to midline 10x -chest press 10x -tricep extension 10x     isometric contraction against PT resistance: -hip flexion 10x 3 second hold -hip abduction 10x 3 second hold -hip adduction 10x 3 second holds  Trunk extension with arms crossed 10x; pain relieving with repetition  Posterior pelvic tilt with 3 second holds, 12x very challenging for patient  Pectoral stretch with external rotation and UE horizontal abduction 15x    Patient utilizes ice pack for reduction of internal temperature to reduce risk of MS exacerbation and allow continuation of progression of interventions.     Pt educated throughout session about proper posture and technique with exercises. Improved exercise technique, movement at target joints, use of target muscles after min to mod verbal, visual, tactile cues.                          PT Education - 01/01/20 1155    Education provided Yes    Education Details exercise technique, body mechanics    Person(s) Educated Patient    Methods Explanation;Demonstration;Tactile cues;Verbal cues    Comprehension Verbalized understanding;Returned demonstration;Verbal cues required;Tactile cues required            PT Short Term Goals - 12/16/19 1319      PT SHORT TERM GOAL #1   Title Patient will be independent in home exercise program to improve strength/mobility for better functional independence with ADLs.    Baseline 5/19: HEP next session 6/22: HEP compliant 8/10 : HEP progressions compliant    Time 4    Period Weeks    Status Partially Met    Target Date 01/13/20      PT SHORT TERM GOAL #2   Title Patient will perform a sit to stand with SUE support for increased ind. with transfers.    Baseline 5/19: heavy BUE support 6/22: requires one hand on walker one hand on w/c 8/10: able to perform  intermittently (some days can some days cannot)    Time 4    Period Weeks    Status Partially Met    Target Date 01/13/20             PT Long Term Goals - 12/16/19 0001      PT LONG TERM GOAL #1   Title Patient will increase FOTO score to equal to or greater than  53/100   to demonstrate statistically significant improvement in mobility and quality of life.     Baseline 7/27:  48.1% 6/22: 46.99% 5/18: 48/100    Time 12    Period Weeks    Status Partially Met    Target Date 03/09/20      PT LONG TERM GOAL #2   Title Patient (<  33 years old) will complete five times sit to stand test in < 20 seconds indicating an increased LE strength and improved balance.    Baseline 7/27: 26.8 seconds with one partial stand 6/22: 40. 86 sec; unable to reach full stand last two attempts 5/19: 42 seconds     Time 12    Period Weeks    Status Partially Met    Target Date 03/09/20      PT LONG TERM GOAL #3   Title Patient will increase 10 meter walk test to <20 seconds as to improve gait speed for better community ambulation and to reduce fall risk.    Baseline 7/27: 36 seconds with RW 6/22: 36.1 seconds 5/19: 42 seconds    Time 12    Period Weeks    Status Partially Met    Target Date 03/09/20      PT LONG TERM GOAL #4   Title Patient will reduce timed up and go to <11 seconds to reduce fall risk and demonstrate improved transfer/gait ability.    Baseline 7/27: 43.65 6/22: 45.4 seconds 5/19: 47 seconds    Time 12    Period Weeks    Status Partially Met    Target Date 03/09/20      PT LONG TERM GOAL #5   Title Patient will ambulate from physical therapy gym to elevator down the hall, ride elevator to first floor, and walk to valet with RW and chair follow for increased capacity for mobility and ind.    Baseline 8/10: unable to ambulate to elevator without rest break    Time 12    Period Weeks    Status New    Target Date 03/09/20                 Plan - 01/01/20 1200     Clinical Impression Statement Patient presents with high motivation despite being upset. She is eager to participate in every intervention and requires cueing for rest breaks due to overheating. Increased core stability and righting reactions progression tolerated. The patient would benefit from further skilled PT intervention to maximize QOL, safety, and functional mobility    Personal Factors and Comorbidities Age;Comorbidity 3+;Fitness;Past/Current Experience;Sex;Time since onset of injury/illness/exacerbation    Comorbidities HTN, SVT, neurogenic bowel, MS (1995), neuropathy, hx of wrist fx, HPV, Hypercholesteremia, adiposity, osteopenia, spasticity.    Examination-Activity Limitations Bathing;Bed Mobility;Bend;Carry;Continence;Dressing;Hygiene/Grooming;Stairs;Squat;Reach Overhead;Locomotion Level;Lift;Stand;Transfers    Examination-Participation Restrictions Church;Cleaning;Community Activity;Laundry;Volunteer;Shop;Meal Prep;Yard Work    [employer] Evolving/Moderate complexity    Rehab Potential Good    PT Frequency 2x / week    PT Duration 12 weeks    PT Treatment/Interventions ADLs/Self Care Home Management;Aquatic Therapy;Biofeedback;Cryotherapy;Electrical Stimulation;Iontophoresis 31m/ml Dexamethasone;Traction;Moist Heat;Ultrasound;DME Instruction;Gait training;Stair training;Functional mobility training;Therapeutic activities;Therapeutic exercise;Balance training;Neuromuscular re-education;Patient/family education;Orthotic Fit/Training;Wheelchair mobility training;Manual techniques;Compression bandaging;Passive range of motion;Dry needling;Energy conservation;Taping;Splinting;Vestibular    PT Next Visit Plan step negotiation, turning directions to the L and R    PT Home Exercise Plan maintain current HEP from HHPT    Consulted and Agree with Plan of Care Patient           Patient will benefit from skilled therapeutic intervention in order to improve the following  deficits and impairments:  Abnormal gait, Decreased activity tolerance, Decreased balance, Decreased coordination, Decreased endurance, Decreased mobility, Decreased range of motion, Decreased strength, Difficulty walking, Impaired perceived functional ability, Impaired sensation, Impaired tone, Impaired UE functional use, Improper body mechanics, Postural dysfunction, Cardiopulmonary status limiting activity, Impaired flexibility, Increased muscle spasms  Visit Diagnosis:  Multiple sclerosis exacerbation ([facility])  Muscle weakness (generalized)  Other abnormalities of gait and mobility  Unsteadiness on feet     Problem List Patient Active Problem List   Diagnosis Date Noted  . Hypoalbuminemia due to protein-calorie malnutrition ([place])   . Neurogenic bowel   . Neurogenic bladder   . Labile blood pressure   . Neuropathic pain   . Abscess of female pelvis   . SVT (supraventricular tachycardia) ([place])   . Radial styloid tenosynovitis 03/12/2018  . Wheelchair confinement 02/27/2018  . Localized osteoporosis with current pathological fracture with routine healing 01/19/2017  . Wrist fracture 01/16/2017  . Sprain of ankle 03/23/2016  . Closed fracture of lateral malleolus 03/16/2016  . Health care maintenance 01/24/2016  . Blood pressure elevated without history of HTN 10/25/2015  . Essential hypertension 10/25/2015  . Multiple sclerosis ([place]) 10/02/2015  . Chronic left shoulder pain 07/19/2015  . Multiple sclerosis exacerbation ([place]) 07/14/2015  . MS (multiple sclerosis) ([place]) 11/26/2014  . Increased body mass index 11/26/2014  . HPV test positive 11/26/2014  . Status post laparoscopic supracervical hysterectomy 11/26/2014  . Galactorrhea 11/26/2014  . Back ache 05/21/2014  . Adiposity 05/21/2014  . Disordered sleep 05/21/2014  . Muscle spasticity 05/21/2014  . Spasticity 05/21/2014  . Calculus of kidney 12/09/2013  . Renal colic 54/00/8676  . Hypercholesteremia 08/19/2013  .  Hereditary and idiopathic neuropathy 08/19/2013  . Hypercholesterolemia without hypertriglyceridemia 08/19/2013  . Bladder infection, chronic 07/25/2012  . Disorder of bladder function 07/25/2012  . Incomplete bladder emptying 07/25/2012  . Microscopic hematuria 07/25/2012  . Right upper quadrant pain 07/25/2012   Janna Arch, PT, DPT   01/01/2020, 12:01 PM  [place] MAIN [facility] SERVICES [address] [place], [place], [address] Phone: [phone]   Fax:  [phone]  Name: Lisa Williams MRN: [id number] Date of Birth: 08-Apr-1961

## 2020-01-06 ENCOUNTER — Other Ambulatory Visit: Payer: Self-pay

## 2020-01-06 ENCOUNTER — Ambulatory Visit: Payer: PPO

## 2020-01-06 DIAGNOSIS — R2689 Other abnormalities of gait and mobility: Secondary | ICD-10-CM

## 2020-01-06 DIAGNOSIS — G35 Multiple sclerosis: Secondary | ICD-10-CM

## 2020-01-06 DIAGNOSIS — R2681 Unsteadiness on feet: Secondary | ICD-10-CM

## 2020-01-06 DIAGNOSIS — M6281 Muscle weakness (generalized): Secondary | ICD-10-CM

## 2020-01-06 NOTE — Therapy (Signed)
[place] MAIN [facility] SERVICES [address] [place], [place], [address] Phone: [phone]   Fax:  [phone]  Physical Therapy Treatment Physical Therapy Progress Note   Dates of reporting period  12/02/19   to   01/06/20  Patient Details  Name: Lisa Williams MRN: [id number] Date of Birth: 03-May-1961 Referring Provider (PT): Gurney Maxin, MD   Encounter Date: 01/06/2020   PT End of Session - 01/06/20 1151    Visit Number 30    Number of Visits 48    Date for PT Re-Evaluation 03/09/20    Authorization Type 10/10 PN 7/27; next session 1/10 Pn 01/06/20    PT Start Time 1015    PT Stop Time 1100    PT Time Calculation (min) 45 min    Equipment Utilized During Treatment Gait belt;Other (comment)   bilateral AFOs   Activity Tolerance Patient tolerated treatment well    Behavior During Therapy [facility] for tasks assessed/performed           Past Medical History:  Diagnosis Date   Abdominal pain, right upper quadrant    Back pain    Calculus of kidney 12/09/2013   Chronic back pain    unspecified   Chronic left shoulder pain 07/19/2015   Functional disorder of bladder    other   Galactorrhea 11/26/2014   Chronic    Hereditary and idiopathic neuropathy 08/19/2013   HPV test positive    Hypercholesteremia 08/19/2013   Incomplete bladder emptying    Microscopic hematuria    MS (multiple sclerosis) (HCC)    Muscle spasticity 05/21/2014   Nonspecific findings on examination of urine    other   Osteopenia    Status post laparoscopic supracervical hysterectomy 11/26/2014   Tobacco user 11/26/2014   Wrist fracture     Past Surgical History:  Procedure Laterality Date   bilateral tubal ligation  1996   BREAST CYST EXCISION Left 2002   KNEE SURGERY     right   LAPAROSCOPIC SUPRACERVICAL HYSTERECTOMY  08/05/2013   ORIF WRIST FRACTURE Left 01/17/2017   Procedure: OPEN REDUCTION INTERNAL FIXATION (ORIF) WRIST  FRACTURE;  Surgeon: Lovell Sheehan, MD;  Location: [facility] ORS;  Service: Orthopedics;  Laterality: Left;   TUBAL LIGATION Bilateral    VAGINAL HYSTERECTOMY  03/2006    There were no vitals filed for this visit.   Subjective Assessment - 01/06/20 1141    Subjective Patient reports fatigue from extreme heat outside today. Has been compliant with HEP    Pertinent History Patient is previous patient of this therapist, was admitted to the hospital on 08/03/19 for MS exacerbation and then went to inpatient rehab on 08/08/19. Patient then received home health therapy and now is returning to outpatient PT. PMh includes HTN, SVT, neurogenic bowel, MS (1995), neuropathy, hx of wrist fx, HPV, Hypercholesteremia, adiposity, osteopenia, spasticity. She wears bilateral AFOs and uses a RW and/or wheelchair for mobility. She drives an adapted car.    Limitations Lifting;Standing;Walking;House hold activities    How long can you sit comfortably? n/a    How long can you stand comfortably? 4 minutes    How long can you walk comfortably? walked 50 ft in inpatient rehab    Patient Stated Goals return to PLOF, increase walking, get into shower, increase mobility    Currently in Pain? No/denies                  Goals:  STS with SUE support FOTO  48.13 5x STS: defer to next session due to fatigue.  10 MWT: 35.10 TUG: 50. 8 seconds with RW; extreme fatigue and overheating.  Ambulate from PT gym to elevator: ambulate 24 ft with ability to static stand reach to open door and then walk through door with RW, seated rest break after 24 ft then stand and walk additional 26 ft . Terminated by therapist due to overheating of patient for reduction of risk or exacerbation.    Treatment: Swiss ball:  -overhead raises with TrA contraction 10x -TrA contraction press between hands and knees 10x 5 second holds -alternating rainbow with supination/pronation of ball 10x each side -forward rollout and return back to  upright posture 10x  Patient utilizes ice pack for reduction of internal temperature to reduce risk of MS exacerbation and allow continuation of progression of interventions.  Pt educated throughout session about proper posture and technique with exercises. Improved exercise technique, movement at target joints, use of target muscles after min to mod verbal, visual, tactile cues.  Patient's condition has the potential to improve in response to therapy. Maximum improvement is yet to be obtained. The anticipated improvement is attainable and reasonable in a generally predictable time.  Patient reports she is becoming more independent in her standing tasks. Is more tired this session due to the extreme heat outside.                    PT Education - 01/06/20 1141    Education provided Yes    Education Details goals, POC, body mechanics    Person(s) Educated Patient    Methods Explanation;Demonstration;Tactile cues;Verbal cues    Comprehension Verbalized understanding;Returned demonstration;Verbal cues required;Tactile cues required            PT Short Term Goals - 01/06/20 1228      PT SHORT TERM GOAL #1   Title Patient will be independent in home exercise program to improve strength/mobility for better functional independence with ADLs.    Baseline 5/19: HEP next session 6/22: HEP compliant 8/10 : HEP progressions compliant 8/31 HEP compliant    Time 4    Period Weeks    Status Partially Met    Target Date 01/13/20      PT SHORT TERM GOAL #2   Title Patient will perform a sit to stand with SUE support for increased ind. with transfers.    Baseline 5/19: heavy BUE support 6/22: requires one hand on walker one hand on w/c 8/10: able to perform intermittently (some days can some days cannot) 8/31: attempt next session    Time 4    Period Weeks    Status Partially Met    Target Date 01/13/20             PT Long Term Goals - 01/06/20 0001      PT LONG TERM  GOAL #1   Title Patient will increase FOTO score to equal to or greater than  53/100   to demonstrate statistically significant improvement in mobility and quality of life.     Baseline 8/31: 48.13% 7/27:  48.1% 6/22: 46.99% 5/18: 48/100    Time 12    Period Weeks    Status Partially Met    Target Date 03/09/20      PT LONG TERM GOAL #2   Title Patient (< 69 years old) will complete five times sit to stand test in < 20 seconds indicating an increased LE strength and improved balance.  Baseline 7/27: 26.8 seconds with one partial stand 6/22: 40. 86 sec; unable to reach full stand last two attempts 5/19: 42 seconds     Time 12    Period Weeks    Status Partially Met    Target Date 03/09/20      PT LONG TERM GOAL #3   Title Patient will increase 10 meter walk test to <20 seconds as to improve gait speed for better community ambulation and to reduce fall risk.    Baseline  8/31: 35.1 seconds with RW 7/27: 36 seconds with RW 6/22: 36.1 seconds 5/19: 42 seconds    Time 12    Period Weeks    Status Partially Met    Target Date 03/09/20      PT LONG TERM GOAL #4   Title Patient will reduce timed up and go to <11 seconds to reduce fall risk and demonstrate improved transfer/gait ability.    Baseline  8/31: 50.8 seconds with RW 7/27: 43.65 6/22: 45.4 seconds 5/19: 47 seconds    Time 12    Period Weeks    Status On-going    Target Date 03/09/20      PT LONG TERM GOAL #5   Title Patient will ambulate from physical therapy gym to elevator down the hall, ride elevator to first floor, and walk to valet with RW and chair follow for increased capacity for mobility and ind.    Baseline 8/31: able to open door ~50 ft with one rest break 8/10: unable to ambulate to elevator without rest break    Time 12    Period Weeks    Status Partially Met    Target Date 03/09/20                 Plan - 01/06/20 1227    Clinical Impression Statement Patient's goal progression limited by patient  being affected by high heat levels resulting in increased fatigue and episodes of heating up. Patient did improve 10MWT by one second and is increasing her distance of ambulation with increased dynamic stability allowing her to open doors while standing. Patient's condition has the potential to improve in response to therapy. Maximum improvement is yet to be obtained. The anticipated improvement is attainable and reasonable in a generally predictable time. The patient would benefit from further skilled PT intervention to maximize QOL, safety, and functional mobility    Personal Factors and Comorbidities Age;Comorbidity 3+;Fitness;Past/Current Experience;Sex;Time since onset of injury/illness/exacerbation    Comorbidities HTN, SVT, neurogenic bowel, MS (1995), neuropathy, hx of wrist fx, HPV, Hypercholesteremia, adiposity, osteopenia, spasticity.    Examination-Activity Limitations Bathing;Bed Mobility;Bend;Carry;Continence;Dressing;Hygiene/Grooming;Stairs;Squat;Reach Overhead;Locomotion Level;Lift;Stand;Transfers    Examination-Participation Restrictions Church;Cleaning;Community Activity;Laundry;Volunteer;Shop;Meal Prep;Yard Work    [employer]chant [employer] officer Evolving/Moderate complexity    Rehab Potential Good    PT Frequency 2x / week    PT Duration 12 weeks    PT Treatment/Interventions ADLs/Self Care Home Management;Aquatic Therapy;Biofeedback;Cryotherapy;Electrical Stimulation;Iontophoresis 22m/ml Dexamethasone;Traction;Moist Heat;Ultrasound;DME Instruction;Gait training;Stair training;Functional mobility training;Therapeutic activities;Therapeutic exercise;Balance training;Neuromuscular re-education;Patient/family education;Orthotic Fit/Training;Wheelchair mobility training;Manual techniques;Compression bandaging;Passive range of motion;Dry needling;Energy conservation;Taping;Splinting;Vestibular    PT Next Visit Plan step negotiation, turning directions to the L and R    PT Home Exercise  Plan maintain current HEP from HHPT    Consulted and Agree with Plan of Care Patient           Patient will benefit from skilled therapeutic intervention in order to improve the following deficits and impairments:  Abnormal gait, Decreased activity tolerance, Decreased balance, Decreased coordination, Decreased  endurance, Decreased mobility, Decreased range of motion, Decreased strength, Difficulty walking, Impaired perceived functional ability, Impaired sensation, Impaired tone, Impaired UE functional use, Improper body mechanics, Postural dysfunction, Cardiopulmonary status limiting activity, Impaired flexibility, Increased muscle spasms  Visit Diagnosis: Multiple sclerosis exacerbation (HCC)  Muscle weakness (generalized)  Other abnormalities of gait and mobility  Unsteadiness on feet     Problem List Patient Active Problem List   Diagnosis Date Noted   Hypoalbuminemia due to protein-calorie malnutrition ([place])    Neurogenic bowel    Neurogenic bladder    Labile blood pressure    Neuropathic pain    Abscess of female pelvis    SVT (supraventricular tachycardia) (HCC)    Radial styloid tenosynovitis 03/12/2018   Wheelchair confinement 02/27/2018   Localized osteoporosis with current pathological fracture with routine healing 01/19/2017   Wrist fracture 01/16/2017   Sprain of ankle 03/23/2016   Closed fracture of lateral malleolus 03/16/2016   Health care maintenance 01/24/2016   Blood pressure elevated without history of HTN 10/25/2015   Essential hypertension 10/25/2015   Multiple sclerosis ([place]) 10/02/2015   Chronic left shoulder pain 07/19/2015   Multiple sclerosis exacerbation ([place]) 07/14/2015   MS (multiple sclerosis) ([place]) 11/26/2014   Increased body mass index 11/26/2014   HPV test positive 11/26/2014   Status post laparoscopic supracervical hysterectomy 11/26/2014   Galactorrhea 11/26/2014   Back ache 05/21/2014   Adiposity  05/21/2014   Disordered sleep 05/21/2014   Muscle spasticity 05/21/2014   Spasticity 05/21/2014   Calculus of kidney 78/58/8502   Renal colic 77/41/2878   Hypercholesteremia 08/19/2013   Hereditary and idiopathic neuropathy 08/19/2013   Hypercholesterolemia without hypertriglyceridemia 08/19/2013   Bladder infection, chronic 07/25/2012   Disorder of bladder function 07/25/2012   Incomplete bladder emptying 07/25/2012   Microscopic hematuria 07/25/2012   Right upper quadrant pain 07/25/2012   Janna Arch, PT, DPT   01/06/2020, 12:32 PM  [facility] MAIN [facility] SERVICES [address] [place], [place], [address] Phone: [phone]   Fax:  [phone]  Name: Xitlally Mooneyham Hellen MRN: [id number] Date of Birth: 05-22-1960

## 2020-01-08 ENCOUNTER — Other Ambulatory Visit: Payer: Self-pay

## 2020-01-08 ENCOUNTER — Ambulatory Visit: Payer: PPO | Attending: Neurology

## 2020-01-08 DIAGNOSIS — R2689 Other abnormalities of gait and mobility: Secondary | ICD-10-CM | POA: Diagnosis not present

## 2020-01-08 DIAGNOSIS — R2681 Unsteadiness on feet: Secondary | ICD-10-CM | POA: Insufficient documentation

## 2020-01-08 DIAGNOSIS — G35 Multiple sclerosis: Secondary | ICD-10-CM | POA: Diagnosis not present

## 2020-01-08 DIAGNOSIS — M6281 Muscle weakness (generalized): Secondary | ICD-10-CM | POA: Diagnosis not present

## 2020-01-08 NOTE — Therapy (Signed)
[place] MAIN [facility] SERVICES [address] [place], [place], [address] Phone: [phone]   Fax:  [phone]  Physical Therapy Treatment  Patient Details  Name: Lisa Williams MRN: [id number] Date of Birth: [birth year] Referring Provider (PT): Gurney Maxin, MD   Encounter Date: 01/08/2020   PT End of Session - 01/08/20 1458    Visit Number 31    Number of Visits 48    Date for PT Re-Evaluation 03/09/20    Authorization Type 1/10 Pn 01/06/20    PT Start Time 1015    PT Stop Time 1059    PT Time Calculation (min) 44 min    Equipment Utilized During Treatment Gait belt;Other (comment)   bilateral AFOs   Activity Tolerance Patient tolerated treatment well    Behavior During Therapy WFL for tasks assessed/performed           Past Medical History:  Diagnosis Date  . Abdominal pain, right upper quadrant   . Back pain   . Calculus of kidney 12/09/2013  . Chronic back pain    unspecified  . Chronic left shoulder pain 07/19/2015  . Functional disorder of bladder    other  . Galactorrhea 11/26/2014   Chronic   . Hereditary and idiopathic neuropathy 08/19/2013  . HPV test positive   . Hypercholesteremia 08/19/2013  . Incomplete bladder emptying   . Microscopic hematuria   . MS (multiple sclerosis) ([place])   . Muscle spasticity 05/21/2014  . Nonspecific findings on examination of urine    other  . Osteopenia   . Status post laparoscopic supracervical hysterectomy 11/26/2014  . Tobacco user 11/26/2014  . Wrist fracture     Past Surgical History:  Procedure Laterality Date  . bilateral tubal ligation  1996  . BREAST CYST EXCISION Left 2002  . KNEE SURGERY     right  . LAPAROSCOPIC SUPRACERVICAL HYSTERECTOMY  08/05/2013  . ORIF WRIST FRACTURE Left 01/17/2017   Procedure: OPEN REDUCTION INTERNAL FIXATION (ORIF) WRIST FRACTURE;  Surgeon: Lovell Sheehan, MD;  Location: [facility] ORS;  Service: Orthopedics;  Laterality: Left;  . TUBAL  LIGATION Bilateral   . VAGINAL HYSTERECTOMY  03/2006    There were no vitals filed for this visit.   Subjective Assessment - 01/08/20 1457    Subjective Patient presents with walker ready to participate in physical therapy this morning. Has been compliant with HEP.    Pertinent History Patient is previous patient of this therapist, was admitted to the hospital on 08/03/19 for MS exacerbation and then went to inpatient rehab on 08/08/19. Patient then received home health therapy and now is returning to outpatient PT. PMh includes HTN, SVT, neurogenic bowel, MS (1995), neuropathy, hx of wrist fx, HPV, Hypercholesteremia, adiposity, osteopenia, spasticity. She wears bilateral AFOs and uses a RW and/or wheelchair for mobility. She drives an adapted car.    Limitations Lifting;Standing;Walking;House hold activities    How long can you sit comfortably? n/a    How long can you stand comfortably? 4 minutes    How long can you walk comfortably? walked 50 ft in inpatient rehab    Patient Stated Goals return to PLOF, increase walking, get into shower, increase mobility    Currently in Pain? No/denies                  5x STS: 24.32    In // bars: Weight shift L and R 15x cues for upight posture and LE movement rather than pull with  UE Hip extension/step back 8x each LE, focus on gluteal squeeze and activation  Modified tandem stance, letting go with UE's then static stand 10-15 seconds x2 trials each LE placement PVC pipe raises with no UE support 10x; cues for core activation for upright posture   Seated PVC pipe overhead raises 10x PVC pipe row with focus on scapular retraction and depression 15x PVC pipe overhead press 10x PVC pipe alternating rows 15x each side  Toileting: assistance for set up: placement of walker, holding wheelchair, opening door. Closing door, and return to walker after toileting completed.     Patient utilizes ice pack for reduction of internal temperature to  reduce risk of MS exacerbation and allow continuation of progression of interventions.     Pt educated throughout session about proper posture and technique with exercises. Improved exercise technique, movement at target joints, use of target muscles after min to mod verbal, visual, tactile cues.                 PT Education - 01/08/20 1458    Education provided Yes    Education Details exercise technique, body mechanics    Person(s) Educated Patient    Methods Explanation;Demonstration;Tactile cues;Verbal cues    Comprehension Verbalized understanding;Returned demonstration;Verbal cues required;Tactile cues required            PT Short Term Goals - 01/06/20 1228      PT SHORT TERM GOAL #1   Title Patient will be independent in home exercise program to improve strength/mobility for better functional independence with ADLs.    Baseline 5/19: HEP next session 6/22: HEP compliant 8/10 : HEP progressions compliant 8/31 HEP compliant    Time 4    Period Weeks    Status Partially Met    Target Date 01/13/20      PT SHORT TERM GOAL #2   Title Patient will perform a sit to stand with SUE support for increased ind. with transfers.    Baseline 5/19: heavy BUE support 6/22: requires one hand on walker one hand on w/c 8/10: able to perform intermittently (some days can some days cannot) 8/31: attempt next session    Time 4    Period Weeks    Status Partially Met    Target Date 01/13/20             PT Long Term Goals - 01/06/20 0001      PT LONG TERM GOAL #1   Title Patient will increase FOTO score to equal to or greater than  53/100   to demonstrate statistically significant improvement in mobility and quality of life.     Baseline 8/31: 48.13% 7/27:  48.1% 6/22: 46.99% 5/18: 48/100    Time 12    Period Weeks    Status Partially Met    Target Date 03/09/20      PT LONG TERM GOAL #2   Title Patient (< 64 years old) will complete five times sit to stand test in < 20  seconds indicating an increased LE strength and improved balance.    Baseline 7/27: 26.8 seconds with one partial stand 6/22: 40. 86 sec; unable to reach full stand last two attempts 5/19: 42 seconds     Time 12    Period Weeks    Status Partially Met    Target Date 03/09/20      PT LONG TERM GOAL #3   Title Patient will increase 10 meter walk test to <20 seconds as to improve  gait speed for better community ambulation and to reduce fall risk.    Baseline  8/31: 35.1 seconds with RW 7/27: 36 seconds with RW 6/22: 36.1 seconds 5/19: 42 seconds    Time 12    Period Weeks    Status Partially Met    Target Date 03/09/20      PT LONG TERM GOAL #4   Title Patient will reduce timed up and go to <11 seconds to reduce fall risk and demonstrate improved transfer/gait ability.    Baseline  8/31: 50.8 seconds with RW 7/27: 43.65 6/22: 45.4 seconds 5/19: 47 seconds    Time 12    Period Weeks    Status On-going    Target Date 03/09/20      PT LONG TERM GOAL #5   Title Patient will ambulate from physical therapy gym to elevator down the hall, ride elevator to first floor, and walk to valet with RW and chair follow for increased capacity for mobility and ind.    Baseline 8/31: able to open door ~50 ft with one rest break 8/10: unable to ambulate to elevator without rest break    Time 12    Period Weeks    Status Partially Met    Target Date 03/09/20                 Plan - 01/08/20 1509    Clinical Impression Statement Patient presents with excellent motivation to physical therapy session. Standing interventions tolerated well with increased portion of session focused on standing. Toileting performed at end of session required assistance for set up due to location. The patient would benefit from further skilled PT intervention to maximize QOL, safety, and functional mobility    Personal Factors and Comorbidities Age;Comorbidity 3+;Fitness;Past/Current Experience;Sex;Time since onset of  injury/illness/exacerbation    Comorbidities HTN, SVT, neurogenic bowel, MS (1995), neuropathy, hx of wrist fx, HPV, Hypercholesteremia, adiposity, osteopenia, spasticity.    Examination-Activity Limitations Bathing;Bed Mobility;Bend;Carry;Continence;Dressing;Hygiene/Grooming;Stairs;Squat;Reach Overhead;Locomotion Level;Lift;Stand;Transfers    Examination-Participation Restrictions Church;Cleaning;Community Activity;Laundry;Volunteer;Shop;Meal Prep;Yard Work    [employer]chant [employer] officer Evolving/Moderate complexity    Rehab Potential Good    PT Frequency 2x / week    PT Duration 12 weeks    PT Treatment/Interventions ADLs/Self Care Home Management;Aquatic Therapy;Biofeedback;Cryotherapy;Electrical Stimulation;Iontophoresis 7m/ml Dexamethasone;Traction;Moist Heat;Ultrasound;DME Instruction;Gait training;Stair training;Functional mobility training;Therapeutic activities;Therapeutic exercise;Balance training;Neuromuscular re-education;Patient/family education;Orthotic Fit/Training;Wheelchair mobility training;Manual techniques;Compression bandaging;Passive range of motion;Dry needling;Energy conservation;Taping;Splinting;Vestibular    PT Next Visit Plan step negotiation, turning directions to the L and R    PT Home Exercise Plan maintain current HEP from HHPT    Consulted and Agree with Plan of Care Patient           Patient will benefit from skilled therapeutic intervention in order to improve the following deficits and impairments:  Abnormal gait, Decreased activity tolerance, Decreased balance, Decreased coordination, Decreased endurance, Decreased mobility, Decreased range of motion, Decreased strength, Difficulty walking, Impaired perceived functional ability, Impaired sensation, Impaired tone, Impaired UE functional use, Improper body mechanics, Postural dysfunction, Cardiopulmonary status limiting activity, Impaired flexibility, Increased muscle spasms  Visit Diagnosis: Multiple  sclerosis exacerbation (HCC)  Muscle weakness (generalized)  Other abnormalities of gait and mobility  Unsteadiness on feet     Problem List Patient Active Problem List   Diagnosis Date Noted  . Hypoalbuminemia due to protein-calorie malnutrition ([place]   . Neurogenic bowel   . Neurogenic bladder   . Labile blood pressure   . Neuropathic pain   . Abscess of female pelvis   .  SVT (supraventricular tachycardia) ([place])   . Radial styloid tenosynovitis 03/12/2018  . Wheelchair confinement 02/27/2018  . Localized osteoporosis with current pathological fracture with routine healing 01/19/2017  . Wrist fracture 01/16/2017  . Sprain of ankle 03/23/2016  . Closed fracture of lateral malleolus 03/16/2016  . Health care maintenance 01/24/2016  . Blood pressure elevated without history of HTN 10/25/2015  . Essential hypertension 10/25/2015  . Multiple sclerosis ([place]) 10/02/2015  . Chronic left shoulder pain 07/19/2015  . Multiple sclerosis exacerbation ([place]) 07/14/2015  . MS (multiple sclerosis) ([place]) 11/26/2014  . Increased body mass index 11/26/2014  . HPV test positive 11/26/2014  . Status post laparoscopic supracervical hysterectomy 11/26/2014  . Galactorrhea 11/26/2014  . Back ache 05/21/2014  . Adiposity 05/21/2014  . Disordered sleep 05/21/2014  . Muscle spasticity 05/21/2014  . Spasticity 05/21/2014  . Calculus of kidney 12/09/2013  . Renal colic 61/54/8845  . Hypercholesteremia 08/19/2013  . Hereditary and idiopathic neuropathy 08/19/2013  . Hypercholesterolemia without hypertriglyceridemia 08/19/2013  . Bladder infection, chronic 07/25/2012  . Disorder of bladder function 07/25/2012  . Incomplete bladder emptying 07/25/2012  . Microscopic hematuria 07/25/2012  . Right upper quadrant pain 07/25/2012   Janna Arch, PT, DPT   01/08/2020, 3:10 PM  [place] MAIN [facility] SERVICES [address] [place], [place], [address] Phone:  [phone]   Fax:  [phone]  Name: Lisa Williams MRN: [id number] Date of Birth: 1960/11/29

## 2020-01-13 ENCOUNTER — Other Ambulatory Visit: Payer: Self-pay

## 2020-01-13 ENCOUNTER — Ambulatory Visit: Payer: PPO

## 2020-01-13 DIAGNOSIS — G35 Multiple sclerosis: Secondary | ICD-10-CM

## 2020-01-13 DIAGNOSIS — R2689 Other abnormalities of gait and mobility: Secondary | ICD-10-CM

## 2020-01-13 DIAGNOSIS — M6281 Muscle weakness (generalized): Secondary | ICD-10-CM

## 2020-01-13 DIAGNOSIS — R2681 Unsteadiness on feet: Secondary | ICD-10-CM

## 2020-01-13 NOTE — Therapy (Signed)
[place] MAIN [facility] SERVICES [address] [place], [place], [address] Phone: [phone]   Fax:  [phone]  Physical Therapy Treatment  Patient Details  Name: Lisa Williams MRN: [id number] Date of Birth: Jun 18, 1960 Referring Provider (PT): Gurney Maxin, MD   Encounter Date: 01/13/2020   PT End of Session - 01/13/20 1253    Visit Number 32    Number of Visits 48    Date for PT Re-Evaluation 03/09/20    Authorization Type 2/10 Pn 01/06/20    PT Start Time 1015    PT Stop Time 1055    PT Time Calculation (min) 40 min    Equipment Utilized During Treatment Gait belt;Other (comment)   bilateral AFOs   Activity Tolerance Patient tolerated treatment well    Behavior During Therapy WFL for tasks assessed/performed           Past Medical History:  Diagnosis Date  . Abdominal pain, right upper quadrant   . Back pain   . Calculus of kidney 12/09/2013  . Chronic back pain    unspecified  . Chronic left shoulder pain 07/19/2015  . Functional disorder of bladder    other  . Galactorrhea 11/26/2014   Chronic   . Hereditary and idiopathic neuropathy 08/19/2013  . HPV test positive   . Hypercholesteremia 08/19/2013  . Incomplete bladder emptying   . Microscopic hematuria   . MS (multiple sclerosis) ([place])   . Muscle spasticity 05/21/2014  . Nonspecific findings on examination of urine    other  . Osteopenia   . Status post laparoscopic supracervical hysterectomy 11/26/2014  . Tobacco user 11/26/2014  . Wrist fracture     Past Surgical History:  Procedure Laterality Date  . bilateral tubal ligation  1996  . BREAST CYST EXCISION Left 2002  . KNEE SURGERY     right  . LAPAROSCOPIC SUPRACERVICAL HYSTERECTOMY  08/05/2013  . ORIF WRIST FRACTURE Left 01/17/2017   Procedure: OPEN REDUCTION INTERNAL FIXATION (ORIF) WRIST FRACTURE;  Surgeon: Lovell Sheehan, MD;  Location: [facility] ORS;  Service: Orthopedics;  Laterality: Left;  . TUBAL  LIGATION Bilateral   . VAGINAL HYSTERECTOMY  03/2006    There were no vitals filed for this visit.   Subjective Assessment - 01/13/20 1022    Subjective Patient has a doctor appointment after this session. Reports no falls or LOB since last session.    Pertinent History Patient is previous patient of this therapist, was admitted to the hospital on 08/03/19 for MS exacerbation and then went to inpatient rehab on 08/08/19. Patient then received home health therapy and now is returning to outpatient PT. PMh includes HTN, SVT, neurogenic bowel, MS (1995), neuropathy, hx of wrist fx, HPV, Hypercholesteremia, adiposity, osteopenia, spasticity. She wears bilateral AFOs and uses a RW and/or wheelchair for mobility. She drives an adapted car.    Limitations Lifting;Standing;Walking;House hold activities    How long can you sit comfortably? n/a    How long can you stand comfortably? 4 minutes    How long can you walk comfortably? walked 50 ft in inpatient rehab    Patient Stated Goals return to PLOF, increase walking, get into shower, increase mobility    Currently in Pain? No/denies                In // bars: Weight shift L and R 15x cues for upright posture and LE movement rather than pull with UE Modified tandem stance, letting go with UE's then  static stand 10-15 seconds x2 trials each LE placement PVC pipe raises with no UE support 10x; cues for core activation for upright posture   forward and backwards ambulation in // bars improved hip extension bilaterally  Seated PVC pipe overhead raises 10x PVC pipe row with focus on scapular retraction and depression 15x PVC pipe overhead press 10x   Weighted ball (2000 gr) chest press 10x, arc pull 10x, weighted pass 10x .    Hamstring length 60 seconds x2 trials each LE   Patient utilizes ice pack for reduction of internal temperature to reduce risk of MS exacerbation and allow continuation of progression of interventions.     Pt educated  throughout session about proper posture and technique with exercises. Improved exercise technique, movement at target joints, use of target muscles after min to mod verbal, visual, tactile cues.                     PT Education - 01/13/20 1252    Education provided Yes    Education Details exercise technique, body mechanics    Person(s) Educated Patient    Methods Explanation;Demonstration;Tactile cues;Verbal cues    Comprehension Verbalized understanding;Returned demonstration;Verbal cues required;Tactile cues required            PT Short Term Goals - 01/06/20 1228      PT SHORT TERM GOAL #1   Title Patient will be independent in home exercise program to improve strength/mobility for better functional independence with ADLs.    Baseline 5/19: HEP next session 6/22: HEP compliant 8/10 : HEP progressions compliant 8/31 HEP compliant    Time 4    Period Weeks    Status Partially Met    Target Date 01/13/20      PT SHORT TERM GOAL #2   Title Patient will perform a sit to stand with SUE support for increased ind. with transfers.    Baseline 5/19: heavy BUE support 6/22: requires one hand on walker one hand on w/c 8/10: able to perform intermittently (some days can some days cannot) 8/31: attempt next session    Time 4    Period Weeks    Status Partially Met    Target Date 01/13/20             PT Long Term Goals - 01/06/20 0001      PT LONG TERM GOAL #1   Title Patient will increase FOTO score to equal to or greater than  53/100   to demonstrate statistically significant improvement in mobility and quality of life.     Baseline 8/31: 48.13% 7/27:  48.1% 6/22: 46.99% 5/18: 48/100    Time 12    Period Weeks    Status Partially Met    Target Date 03/09/20      PT LONG TERM GOAL #2   Title Patient (< 10 years old) will complete five times sit to stand test in < 20 seconds indicating an increased LE strength and improved balance.    Baseline 7/27: 26.8 seconds  with one partial stand 6/22: 40. 86 sec; unable to reach full stand last two attempts 5/19: 42 seconds     Time 12    Period Weeks    Status Partially Met    Target Date 03/09/20      PT LONG TERM GOAL #3   Title Patient will increase 10 meter walk test to <20 seconds as to improve gait speed for better community ambulation and to reduce fall  risk.    Baseline  8/31: 35.1 seconds with RW 7/27: 36 seconds with RW 6/22: 36.1 seconds 5/19: 42 seconds    Time 12    Period Weeks    Status Partially Met    Target Date 03/09/20      PT LONG TERM GOAL #4   Title Patient will reduce timed up and go to <11 seconds to reduce fall risk and demonstrate improved transfer/gait ability.    Baseline  8/31: 50.8 seconds with RW 7/27: 43.65 6/22: 45.4 seconds 5/19: 47 seconds    Time 12    Period Weeks    Status On-going    Target Date 03/09/20      PT LONG TERM GOAL #5   Title Patient will ambulate from physical therapy gym to elevator down the hall, ride elevator to first floor, and walk to valet with RW and chair follow for increased capacity for mobility and ind.    Baseline 8/31: able to open door ~50 ft with one rest break 8/10: unable to ambulate to elevator without rest break    Time 12    Period Weeks    Status Partially Met    Target Date 03/09/20                 Plan - 01/13/20 1310    Clinical Impression Statement Patient session limited by having to leave early for physician appointment. Patient has multiple episodes of overheating requiring rest breaks for return to therapeutic range. Core progression and stabilization tolerated well. The patient would benefit from further skilled PT intervention to maximize QOL, safety, and functional mobility    Personal Factors and Comorbidities Age;Comorbidity 3+;Fitness;Past/Current Experience;Sex;Time since onset of injury/illness/exacerbation    Comorbidities HTN, SVT, neurogenic bowel, MS (1995), neuropathy, hx of wrist fx, HPV,  Hypercholesteremia, adiposity, osteopenia, spasticity.    Examination-Activity Limitations Bathing;Bed Mobility;Bend;Carry;Continence;Dressing;Hygiene/Grooming;Stairs;Squat;Reach Overhead;Locomotion Level;Lift;Stand;Transfers    Examination-Participation Restrictions Church;Cleaning;Community Activity;Laundry;Volunteer;Shop;Meal Prep;Yard Work    [employer]chant [employer] officer Evolving/Moderate complexity    Rehab Potential Good    PT Frequency 2x / week    PT Duration 12 weeks    PT Treatment/Interventions ADLs/Self Care Home Management;Aquatic Therapy;Biofeedback;Cryotherapy;Electrical Stimulation;Iontophoresis 49m/ml Dexamethasone;Traction;Moist Heat;Ultrasound;DME Instruction;Gait training;Stair training;Functional mobility training;Therapeutic activities;Therapeutic exercise;Balance training;Neuromuscular re-education;Patient/family education;Orthotic Fit/Training;Wheelchair mobility training;Manual techniques;Compression bandaging;Passive range of motion;Dry needling;Energy conservation;Taping;Splinting;Vestibular    PT Next Visit Plan step negotiation, turning directions to the L and R    PT Home Exercise Plan maintain current HEP from HHPT    Consulted and Agree with Plan of Care Patient           Patient will benefit from skilled therapeutic intervention in order to improve the following deficits and impairments:  Abnormal gait, Decreased activity tolerance, Decreased balance, Decreased coordination, Decreased endurance, Decreased mobility, Decreased range of motion, Decreased strength, Difficulty walking, Impaired perceived functional ability, Impaired sensation, Impaired tone, Impaired UE functional use, Improper body mechanics, Postural dysfunction, Cardiopulmonary status limiting activity, Impaired flexibility, Increased muscle spasms  Visit Diagnosis: Multiple sclerosis exacerbation (HCC)  Muscle weakness (generalized)  Other abnormalities of gait and mobility  Unsteadiness  on feet     Problem List Patient Active Problem List   Diagnosis Date Noted  . Hypoalbuminemia due to protein-calorie malnutrition ([place]   . Neurogenic bowel   . Neurogenic bladder   . Labile blood pressure   . Neuropathic pain   . Abscess of female pelvis   . SVT ([place]ra[place]ricular tachycardia) ([place]   . Radial styloid tenosynovitis 03/12/2018  . Wheelchair  confinement 02/27/2018  . Localized osteoporosis with current pathological fracture with routine healing 01/19/2017  . Wrist fracture 01/16/2017  . Sprain of ankle 03/23/2016  . Closed fracture of lateral malleolus 03/16/2016  . Health care maintenance 01/24/2016  . Blood pressure elevated without history of HTN 10/25/2015  . Essential hypertension 10/25/2015  . Multiple sclerosis ([place]) 10/02/2015  . Chronic left shoulder pain 07/19/2015  . Multiple sclerosis exacerbation ([place]) 07/14/2015  . MS (multiple sclerosis) ([place]) 11/26/2014  . Increased body mass index 11/26/2014  . HPV test positive 11/26/2014  . Status post laparoscopic supracervical hysterectomy 11/26/2014  . Galactorrhea 11/26/2014  . Back ache 05/21/2014  . Adiposity 05/21/2014  . Disordered sleep 05/21/2014  . Muscle spasticity 05/21/2014  . Spasticity 05/21/2014  . Calculus of kidney 12/09/2013  . Renal colic 25/48/6282  . Hypercholesteremia 08/19/2013  . Hereditary and idiopathic neuropathy 08/19/2013  . Hypercholesterolemia without hypertriglyceridemia 08/19/2013  . Bladder infection, chronic 07/25/2012  . Disorder of bladder function 07/25/2012  . Incomplete bladder emptying 07/25/2012  . Microscopic hematuria 07/25/2012  . Right upper quadrant pain 07/25/2012   Janna Arch, PT, DPT   01/13/2020, 1:17 PM  [place] MAIN [facility] SERVICES [address] [place], [place], [address] Phone: [phone]   Fax:  [phone]  Name: Mileidy Atkin Vankirk MRN: [id number] Date of Birth: 1960/06/10

## 2020-01-15 ENCOUNTER — Other Ambulatory Visit: Payer: Self-pay

## 2020-01-15 ENCOUNTER — Ambulatory Visit: Payer: PPO

## 2020-01-15 DIAGNOSIS — R2689 Other abnormalities of gait and mobility: Secondary | ICD-10-CM

## 2020-01-15 DIAGNOSIS — M6281 Muscle weakness (generalized): Secondary | ICD-10-CM

## 2020-01-15 DIAGNOSIS — G35 Multiple sclerosis: Secondary | ICD-10-CM | POA: Diagnosis not present

## 2020-01-15 DIAGNOSIS — R2681 Unsteadiness on feet: Secondary | ICD-10-CM

## 2020-01-15 NOTE — Therapy (Signed)
[place] MAIN [facility] SERVICES [address] [place], [place], [address] Phone: [phone]   Fax:  [phone]  Physical Therapy Treatment  Patient Details  Name: Lisa Williams MRN: [id number] Date of Birth: June 22, 1960 Referring Provider (PT): Gurney Maxin, MD   Encounter Date: 01/15/2020   PT End of Session - 01/16/20 1032    Visit Number 33    Number of Visits 48    Date for PT Re-Evaluation 03/09/20    Authorization Type 3/10 Pn 01/06/20    PT Start Time 1015    PT Stop Time 1101    PT Time Calculation (min) 46 min    Equipment Utilized During Treatment Gait belt;Other (comment)   bilateral AFOs   Activity Tolerance Patient tolerated treatment well    Behavior During Therapy WFL for tasks assessed/performed           Past Medical History:  Diagnosis Date  . Abdominal pain, right upper quadrant   . Back pain   . Calculus of kidney 12/09/2013  . Chronic back pain    unspecified  . Chronic left shoulder pain 07/19/2015  . Functional disorder of bladder    other  . Galactorrhea 11/26/2014   Chronic   . Hereditary and idiopathic neuropathy 08/19/2013  . HPV test positive   . Hypercholesteremia 08/19/2013  . Incomplete bladder emptying   . Microscopic hematuria   . MS (multiple sclerosis) ([place])   . Muscle spasticity 05/21/2014  . Nonspecific findings on examination of urine    other  . Osteopenia   . Status post laparoscopic supracervical hysterectomy 11/26/2014  . Tobacco user 11/26/2014  . Wrist fracture     Past Surgical History:  Procedure Laterality Date  . bilateral tubal ligation  1996  . BREAST CYST EXCISION Left 2002  . KNEE SURGERY     right  . LAPAROSCOPIC SUPRACERVICAL HYSTERECTOMY  08/05/2013  . ORIF WRIST FRACTURE Left 01/17/2017   Procedure: OPEN REDUCTION INTERNAL FIXATION (ORIF) WRIST FRACTURE;  Surgeon: Lovell Sheehan, MD;  Location: [facility] ORS;  Service: Orthopedics;  Laterality: Left;  . TUBAL  LIGATION Bilateral   . VAGINAL HYSTERECTOMY  03/2006    There were no vitals filed for this visit.   Subjective Assessment - 01/16/20 1029    Subjective Patient reports having a good day; no falls or LOB since last session. Has been compliant with HEP. Having a "stitch" in her side    Pertinent History Patient is previous patient of this therapist, was admitted to the hospital on 08/03/19 for MS exacerbation and then went to inpatient rehab on 08/08/19. Patient then received home health therapy and now is returning to outpatient PT. PMh includes HTN, SVT, neurogenic bowel, MS (1995), neuropathy, hx of wrist fx, HPV, Hypercholesteremia, adiposity, osteopenia, spasticity. She wears bilateral AFOs and uses a RW and/or wheelchair for mobility. She drives an adapted car.    Limitations Lifting;Standing;Walking;House hold activities    How long can you sit comfortably? n/a    How long can you stand comfortably? 4 minutes    How long can you walk comfortably? walked 50 ft in inpatient rehab    Patient Stated Goals return to PLOF, increase walking, get into shower, increase mobility    Currently in Pain? Yes    Pain Score 2     Pain Location Flank    Pain Orientation Right    Pain Descriptors / Indicators Cramping;Jabbing    Pain Type Acute pain  Pain Onset Today    Pain Frequency Intermittent            Standing with RW: Weight shift; slight pain in R side noted:   Ambulate with RW with wheelchair follow: close CGA with cues for weight shift and upright posture for foot clearance 50 ft, x 2 trials with one seated rest break  static stand with SUE support multiple trials 30 seconds each UE.   seated: Silver large swiss ball forward and lateral trunk rollout x 10 each direction (forward, L diagonal, R diagonal) R trunk stretch with overpressure into L sidebend 30 seconds x2 trials Silver large swiss ball: - forward ball roll with core contraction for return to upright posture  10x -overhead raise 10x -chest press 10x -alternating rainbow with supination/pronation of ball 10x each side  Seated boxing: cues for sequencing, body mechanics, and pace/rhythm for functional contraction and timing of muscle recruitment: -Cross body punches to mitts on PT hands with no back support for focused core stabilization with crossing midline 2x 60 seconds  -Cross body punch with secondary combination of elbow for full cross body rotation to PT mitt for trunk stability, coordination, and cardiovascular challenge x 60 seconds -Upper cut to mitts in PT hands for core activation without back support with perturbations 60 seconds    Patient utilizes ice pack for reduction of internal temperature to reduce risk of MS exacerbation and allow continuation of progression of interventions.  Pt educated throughout session about proper posture and technique with exercises. Improved exercise technique, movement at target joints, use of target muscles after min to mod verbal, visual, tactile cues.                     PT Education - 01/16/20 1031    Education provided Yes    Education Details exercise technique, body mechanics    Person(s) Educated Patient    Methods Explanation;Demonstration;Tactile cues;Verbal cues    Comprehension Verbalized understanding;Returned demonstration;Verbal cues required;Tactile cues required            PT Short Term Goals - 01/06/20 1228      PT SHORT TERM GOAL #1   Title Patient will be independent in home exercise program to improve strength/mobility for better functional independence with ADLs.    Baseline 5/19: HEP next session 6/22: HEP compliant 8/10 : HEP progressions compliant 8/31 HEP compliant    Time 4    Period Weeks    Status Partially Met    Target Date 01/13/20      PT SHORT TERM GOAL #2   Title Patient will perform a sit to stand with SUE support for increased ind. with transfers.    Baseline 5/19: heavy BUE  support 6/22: requires one hand on walker one hand on w/c 8/10: able to perform intermittently (some days can some days cannot) 8/31: attempt next session    Time 4    Period Weeks    Status Partially Met    Target Date 01/13/20             PT Long Term Goals - 01/06/20 0001      PT LONG TERM GOAL #1   Title Patient will increase FOTO score to equal to or greater than  53/100   to demonstrate statistically significant improvement in mobility and quality of life.     Baseline 8/31: 48.13% 7/27:  48.1% 6/22: 46.99% 5/18: 48/100    Time 12    Period Weeks  Status Partially Met    Target Date 03/09/20      PT LONG TERM GOAL #2   Title Patient (< 69 years old) will complete five times sit to stand test in < 20 seconds indicating an increased LE strength and improved balance.    Baseline 7/27: 26.8 seconds with one partial stand 6/22: 40. 86 sec; unable to reach full stand last two attempts 5/19: 42 seconds     Time 12    Period Weeks    Status Partially Met    Target Date 03/09/20      PT LONG TERM GOAL #3   Title Patient will increase 10 meter walk test to <20 seconds as to improve gait speed for better community ambulation and to reduce fall risk.    Baseline  8/31: 35.1 seconds with RW 7/27: 36 seconds with RW 6/22: 36.1 seconds 5/19: 42 seconds    Time 12    Period Weeks    Status Partially Met    Target Date 03/09/20      PT LONG TERM GOAL #4   Title Patient will reduce timed up and go to <11 seconds to reduce fall risk and demonstrate improved transfer/gait ability.    Baseline  8/31: 50.8 seconds with RW 7/27: 43.65 6/22: 45.4 seconds 5/19: 47 seconds    Time 12    Period Weeks    Status On-going    Target Date 03/09/20      PT LONG TERM GOAL #5   Title Patient will ambulate from physical therapy gym to elevator down the hall, ride elevator to first floor, and walk to valet with RW and chair follow for increased capacity for mobility and ind.    Baseline 8/31: able  to open door ~50 ft with one rest break 8/10: unable to ambulate to elevator without rest break    Time 12    Period Weeks    Status Partially Met    Target Date 03/09/20                 Plan - 01/16/20 1033    Clinical Impression Statement Patient initially is challenged in standing due to slight pain in R rib/thoracic region that is reduced with rollout and stretching. Seated interventions required after prolonged standing results in excessive fatigue and increased trembling of patient. The patient would benefit from further skilled PT intervention to maximize QOL, safety, and functional mobility    Personal Factors and Comorbidities Age;Comorbidity 3+;Fitness;Past/Current Experience;Sex;Time since onset of injury/illness/exacerbation    Comorbidities HTN, SVT, neurogenic bowel, MS (1995), neuropathy, hx of wrist fx, HPV, Hypercholesteremia, adiposity, osteopenia, spasticity.    Examination-Activity Limitations Bathing;Bed Mobility;Bend;Carry;Continence;Dressing;Hygiene/Grooming;Stairs;Squat;Reach Overhead;Locomotion Level;Lift;Stand;Transfers    Examination-Participation Restrictions Church;Cleaning;Community Activity;Laundry;Volunteer;Shop;Meal Prep;Yard Work    Merchant [employer] officer Evolving/Moderate complexity    Rehab Potential Good    PT Frequency 2x / week    PT Duration 12 weeks    PT Treatment/Interventions ADLs/Self Care Home Management;Aquatic Therapy;Biofeedback;Cryotherapy;Electrical Stimulation;Iontophoresis 66m/ml Dexamethasone;Traction;Moist Heat;Ultrasound;DME Instruction;Gait training;Stair training;Functional mobility training;Therapeutic activities;Therapeutic exercise;Balance training;Neuromuscular re-education;Patient/family education;Orthotic Fit/Training;Wheelchair mobility training;Manual techniques;Compression bandaging;Passive range of motion;Dry needling;Energy conservation;Taping;Splinting;Vestibular    PT Next Visit Plan step negotiation, turning  directions to the L and R    PT Home Exercise Plan maintain current HEP from HHPT    Consulted and Agree with Plan of Care Patient           Patient will benefit from skilled therapeutic intervention in order to improve the following deficits and impairments:  Abnormal gait, Decreased activity tolerance, Decreased balance, Decreased coordination, Decreased endurance, Decreased mobility, Decreased range of motion, Decreased strength, Difficulty walking, Impaired perceived functional ability, Impaired sensation, Impaired tone, Impaired UE functional use, Improper body mechanics, Postural dysfunction, Cardiopulmonary status limiting activity, Impaired flexibility, Increased muscle spasms  Visit Diagnosis: Multiple sclerosis exacerbation (HCC)  Muscle weakness (generalized)  Other abnormalities of gait and mobility  Unsteadiness on feet     Problem List Patient Active Problem List   Diagnosis Date Noted  . Hypoalbuminemia due to protein-calorie malnutrition ([place])   . Neurogenic bowel   . Neurogenic bladder   . Labile blood pressure   . Neuropathic pain   . Abscess of female pelvis   . SVT (supraventricular tachycardia) ([place])   . Radial styloid tenosynovitis 03/12/2018  . Wheelchair confinement 02/27/2018  . Localized osteoporosis with current pathological fracture with routine healing 01/19/2017  . Wrist fracture 01/16/2017  . Sprain of ankle 03/23/2016  . Closed fracture of lateral malleolus 03/16/2016  . Health care maintenance 01/24/2016  . Blood pressure elevated without history of HTN 10/25/2015  . Essential hypertension 10/25/2015  . Multiple sclerosis ([place]) 10/02/2015  . Chronic left shoulder pain 07/19/2015  . Multiple sclerosis exacerbation ([place]) 07/14/2015  . MS (multiple sclerosis) ([place]) 11/26/2014  . Increased body mass index 11/26/2014  . HPV test positive 11/26/2014  . Status post laparoscopic supracervical hysterectomy 11/26/2014  . Galactorrhea 11/26/2014    . Back ache 05/21/2014  . Adiposity 05/21/2014  . Disordered sleep 05/21/2014  . Muscle spasticity 05/21/2014  . Spasticity 05/21/2014  . Calculus of kidney 12/09/2013  . Renal colic 77/61/6076  . Hypercholesteremia 08/19/2013  . Hereditary and idiopathic neuropathy 08/19/2013  . Hypercholesterolemia without hypertriglyceridemia 08/19/2013  . Bladder infection, chronic 07/25/2012  . Disorder of bladder function 07/25/2012  . Incomplete bladder emptying 07/25/2012  . Microscopic hematuria 07/25/2012  . Right upper quadrant pain 07/25/2012   Janna Arch, PT, DPT   01/16/2020, 10:34 AM  [place] MAIN [facility] SERVICES [place], [place], [address] Phone: [phone]   Fax:  [phone]  Name: Kendallyn Lippold Marich MRN: [id number] Date of Birth: 1960-11-04

## 2020-01-20 ENCOUNTER — Other Ambulatory Visit: Payer: Self-pay

## 2020-01-20 ENCOUNTER — Ambulatory Visit: Payer: PPO

## 2020-01-20 DIAGNOSIS — M6281 Muscle weakness (generalized): Secondary | ICD-10-CM

## 2020-01-20 DIAGNOSIS — R2681 Unsteadiness on feet: Secondary | ICD-10-CM

## 2020-01-20 DIAGNOSIS — G35 Multiple sclerosis: Secondary | ICD-10-CM

## 2020-01-20 DIAGNOSIS — R2689 Other abnormalities of gait and mobility: Secondary | ICD-10-CM

## 2020-01-20 NOTE — Therapy (Signed)
[place] MAIN [facility] SERVICES [address] [place], [place], [address] Phone: [phone]   Fax:  [phone]  Physical Therapy Treatment  Patient Details  Name: Lisa Williams MRN: [id number] Date of Birth: August 23, 1960 Referring Provider (PT): Gurney Maxin, MD   Encounter Date: 01/20/2020   PT End of Session - 01/20/20 4403    Visit Number 34    Number of Visits 48    Date for PT Re-Evaluation 03/09/20    Authorization Type 4/10 Pn 01/06/20    PT Start Time 1015    PT Stop Time 1058    PT Time Calculation (min) 43 min    Equipment Utilized During Treatment Gait belt;Other (comment)   bilateral AFOs   Activity Tolerance Patient tolerated treatment well    Behavior During Therapy [facility] for tasks assessed/performed           Past Medical History:  Diagnosis Date   Abdominal pain, right upper quadrant    Back pain    Calculus of kidney 12/09/2013   Chronic back pain    unspecified   Chronic left shoulder pain 07/19/2015   Functional disorder of bladder    other   Galactorrhea 11/26/2014   Chronic    Hereditary and idiopathic neuropathy 08/19/2013   HPV test positive    Hypercholesteremia 08/19/2013   Incomplete bladder emptying    Microscopic hematuria    MS (multiple sclerosis) (HCC)    Muscle spasticity 05/21/2014   Nonspecific findings on examination of urine    other   Osteopenia    Status post laparoscopic supracervical hysterectomy 11/26/2014   Tobacco user 11/26/2014   Wrist fracture     Past Surgical History:  Procedure Laterality Date   bilateral tubal ligation  1996   BREAST CYST EXCISION Left 2002   KNEE SURGERY     right   LAPAROSCOPIC SUPRACERVICAL HYSTERECTOMY  08/05/2013   ORIF WRIST FRACTURE Left 01/17/2017   Procedure: OPEN REDUCTION INTERNAL FIXATION (ORIF) WRIST FRACTURE;  Surgeon: Lovell Sheehan, MD;  Location: [facility] ORS;  Service: Orthopedics;  Laterality: Left;   TUBAL  LIGATION Bilateral    VAGINAL HYSTERECTOMY  03/2006    There were no vitals filed for this visit.   Subjective Assessment - 01/20/20 1013    Subjective Patient reports having a good day; no falls or LOB since last session. Has been compliant with HEP. Having a "stitch" in her side    Pertinent History Patient is previous patient of this therapist, was admitted to the hospital on 08/03/19 for MS exacerbation and then went to inpatient rehab on 08/08/19. Patient then received home health therapy and now is returning to outpatient PT. PMh includes HTN, SVT, neurogenic bowel, MS (1995), neuropathy, hx of wrist fx, HPV, Hypercholesteremia, adiposity, osteopenia, spasticity. She wears bilateral AFOs and uses a RW and/or wheelchair for mobility. She drives an adapted car.    Limitations Lifting;Standing;Walking;House hold activities    How long can you sit comfortably? n/a    How long can you stand comfortably? 4 minutes    How long can you walk comfortably? walked 50 ft in inpatient rehab    Patient Stated Goals return to PLOF, increase walking, get into shower, increase mobility    Pain Onset Today             in bar:  One bar: STS: terminated due to discomfort on single bar:     In // bars: Weight shift L and R  15x cues for upright posture and LE movement rather than pull with UE Rainbow ball pass with SPT, SUE support 8x ; 2 sets  Occasional ability to release with BUE support   forward and backwards ambulation in // bars improved hip extension bilaterally 2x length Backwards ambulation 2x length of // bars, cues for gluteal activation and upright posture   Seated GTB row 10x, cues for proper alignment GTB single arm row 10x each UE GTB overhead raise 10x  Upright posture eccentric posterior trunk lean, return to upright position 8x, lateral L 8x, lateral R 8x Alternating sidebends, challenging to bring self back to Com as fatiguing 8x each side Upright posture clapping patterning  with PT for coordination, stabilization with pertubation's, and sequencing of muscle activation.  odified LAQ 10x each LE, min A for RLE to maintain neutral alignment   isometric contraction against PT resistance: -hip flexion 10x 3 second hold -hip abduction 10x 3 second hold -hip adduction 10x 3 second holds    Patient utilizes ice pack for reduction of internal temperature to reduce risk of MS exacerbation and allow continuation of progression of interventions.     Pt educated throughout session about proper posture and technique with exercises. Improved exercise technique, movement at target joints, use of target muscles after min to mod verbal, visual, tactile cues.                        PT Education - 01/20/20 1239    Education provided Yes    Education Details exercise technique, body mechanics    Person(s) Educated Patient    Methods Explanation;Demonstration;Tactile cues;Verbal cues    Comprehension Verbalized understanding;Returned demonstration;Verbal cues required;Tactile cues required            PT Short Term Goals - 01/06/20 1228      PT SHORT TERM GOAL #1   Title Patient will be independent in home exercise program to improve strength/mobility for better functional independence with ADLs.    Baseline 5/19: HEP next session 6/22: HEP compliant 8/10 : HEP progressions compliant 8/31 HEP compliant    Time 4    Period Weeks    Status Partially Met    Target Date 01/13/20      PT SHORT TERM GOAL #2   Title Patient will perform a sit to stand with SUE support for increased ind. with transfers.    Baseline 5/19: heavy BUE support 6/22: requires one hand on walker one hand on w/c 8/10: able to perform intermittently (some days can some days cannot) 8/31: attempt next session    Time 4    Period Weeks    Status Partially Met    Target Date 01/13/20             PT Long Term Goals - 01/06/20 0001      PT LONG TERM GOAL #1   Title Patient will  increase FOTO score to equal to or greater than  53/100   to demonstrate statistically significant improvement in mobility and quality of life.     Baseline 8/31: 48.13% 7/27:  48.1% 6/22: 46.99% 5/18: 48/100    Time 12    Period Weeks    Status Partially Met    Target Date 03/09/20      PT LONG TERM GOAL #2   Title Patient (< 49 years old) will complete five times sit to stand test in < 20 seconds indicating an increased LE strength and improved  balance.    Baseline 7/27: 26.8 seconds with one partial stand 6/22: 40. 86 sec; unable to reach full stand last two attempts 5/19: 42 seconds     Time 12    Period Weeks    Status Partially Met    Target Date 03/09/20      PT LONG TERM GOAL #3   Title Patient will increase 10 meter walk test to <20 seconds as to improve gait speed for better community ambulation and to reduce fall risk.    Baseline  8/31: 35.1 seconds with RW 7/27: 36 seconds with RW 6/22: 36.1 seconds 5/19: 42 seconds    Time 12    Period Weeks    Status Partially Met    Target Date 03/09/20      PT LONG TERM GOAL #4   Title Patient will reduce timed up and go to <11 seconds to reduce fall risk and demonstrate improved transfer/gait ability.    Baseline  8/31: 50.8 seconds with RW 7/27: 43.65 6/22: 45.4 seconds 5/19: 47 seconds    Time 12    Period Weeks    Status On-going    Target Date 03/09/20      PT LONG TERM GOAL #5   Title Patient will ambulate from physical therapy gym to elevator down the hall, ride elevator to first floor, and walk to valet with RW and chair follow for increased capacity for mobility and ind.    Baseline 8/31: able to open door ~50 ft with one rest break 8/10: unable to ambulate to elevator without rest break    Time 12    Period Weeks    Status Partially Met    Target Date 03/09/20                 Plan - 01/20/20 1243    Clinical Impression Statement Patient continues to progress with functional stabilization and strengthening  interventions. She is able to tolerate pertubations in standing and sitting this session with frequent rests from standing due to fatigue. Core activation and ability to retain COM continues to be an area of focus. The patient would benefit from further skilled PT intervention to maximize QOL, safety, and functional mobility    Personal Factors and Comorbidities Age;Comorbidity 3+;Fitness;Past/Current Experience;Sex;Time since onset of injury/illness/exacerbation    Comorbidities HTN, SVT, neurogenic bowel, MS (1995), neuropathy, hx of wrist fx, HPV, Hypercholesteremia, adiposity, osteopenia, spasticity.    Examination-Activity Limitations Bathing;Bed Mobility;Bend;Carry;Continence;Dressing;Hygiene/Grooming;Stairs;Squat;Reach Overhead;Locomotion Level;Lift;Stand;Transfers    Examination-Participation Restrictions Church;Cleaning;Community Activity;Laundry;Volunteer;Shop;Meal Prep;Yard Work    [employer]chant [employer] officer Evolving/Moderate complexity    Rehab Potential Good    PT Frequency 2x / week    PT Duration 12 weeks    PT Treatment/Interventions ADLs/Self Care Home Management;Aquatic Therapy;Biofeedback;Cryotherapy;Electrical Stimulation;Iontophoresis 39m/ml Dexamethasone;Traction;Moist Heat;Ultrasound;DME Instruction;Gait training;Stair training;Functional mobility training;Therapeutic activities;Therapeutic exercise;Balance training;Neuromuscular re-education;Patient/family education;Orthotic Fit/Training;Wheelchair mobility training;Manual techniques;Compression bandaging;Passive range of motion;Dry needling;Energy conservation;Taping;Splinting;Vestibular    PT Next Visit Plan step negotiation, turning directions to the L and R    PT Home Exercise Plan maintain current HEP from HHPT    Consulted and Agree with Plan of Care Patient           Patient will benefit from skilled therapeutic intervention in order to improve the following deficits and impairments:  Abnormal gait, Decreased  activity tolerance, Decreased balance, Decreased coordination, Decreased endurance, Decreased mobility, Decreased range of motion, Decreased strength, Difficulty walking, Impaired perceived functional ability, Impaired sensation, Impaired tone, Impaired UE functional use, Improper body mechanics, Postural dysfunction,  Cardiopulmonary status limiting activity, Impaired flexibility, Increased muscle spasms  Visit Diagnosis: Multiple sclerosis exacerbation (HCC)  Muscle weakness (generalized)  Other abnormalities of gait and mobility  Unsteadiness on feet     Problem List Patient Active Problem List   Diagnosis Date Noted   Hypoalbuminemia due to protein-calorie malnutrition ([place])    Neurogenic bowel    Neurogenic bladder    Labile blood pressure    Neuropathic pain    Abscess of female pelvis    SVT (supraventricular tachycardia) (HCC)    Radial styloid tenosynovitis 03/12/2018   Wheelchair confinement 02/27/2018   Localized osteoporosis with current pathological fracture with routine healing 01/19/2017   Wrist fracture 01/16/2017   Sprain of ankle 03/23/2016   Closed fracture of lateral malleolus 03/16/2016   Health care maintenance 01/24/2016   Blood pressure elevated without history of HTN 10/25/2015   Essential hypertension 10/25/2015   Multiple sclerosis ([place]) 10/02/2015   Chronic left shoulder pain 07/19/2015   Multiple sclerosis exacerbation ([place]) 07/14/2015   MS (multiple sclerosis) ([place]) 11/26/2014   Increased body mass index 11/26/2014   HPV test positive 11/26/2014   Status post laparoscopic supracervical hysterectomy 11/26/2014   Galactorrhea 11/26/2014   Back ache 05/21/2014   Adiposity 05/21/2014   Disordered sleep 05/21/2014   Muscle spasticity 05/21/2014   Spasticity 05/21/2014   Calculus of kidney 87/18/3672   Renal colic 55/00/1642   Hypercholesteremia 08/19/2013   Hereditary and idiopathic neuropathy 08/19/2013    Hypercholesterolemia without hypertriglyceridemia 08/19/2013   Bladder infection, chronic 07/25/2012   Disorder of bladder function 07/25/2012   Incomplete bladder emptying 07/25/2012   Microscopic hematuria 07/25/2012   Right upper quadrant pain 07/25/2012   Janna Arch, PT, DPT   01/20/2020, 12:44 PM  [place] MAIN [facility] SERVICES [place], [place], [address] Phone: [phone]   Fax:  [phone]  Name: Lisa Williams MRN: [id number] Date of Birth: 02/28/1961

## 2020-01-22 ENCOUNTER — Other Ambulatory Visit: Payer: Self-pay

## 2020-01-22 ENCOUNTER — Ambulatory Visit: Payer: PPO

## 2020-01-22 DIAGNOSIS — G35 Multiple sclerosis: Secondary | ICD-10-CM

## 2020-01-22 DIAGNOSIS — M6281 Muscle weakness (generalized): Secondary | ICD-10-CM

## 2020-01-22 DIAGNOSIS — R2681 Unsteadiness on feet: Secondary | ICD-10-CM

## 2020-01-22 DIAGNOSIS — R2689 Other abnormalities of gait and mobility: Secondary | ICD-10-CM

## 2020-01-22 NOTE — Therapy (Signed)
[place] MAIN [facility] SERVICES [place], [place], [address] Phone: [phone]   Fax:  [phone]  Physical Therapy Treatment  Patient Details  Name: Lisa Williams MRN: [id number] Date of Birth: June 03, 1960 Referring Provider (PT): Gurney Maxin, MD   Encounter Date: 01/22/2020   PT End of Session - 01/22/20 1120    Visit Number 35    Number of Visits 48    Date for PT Re-Evaluation 03/09/20    Authorization Type 5/10 Pn 01/06/20    PT Start Time 1015    PT Stop Time 1059    PT Time Calculation (min) 44 min    Equipment Utilized During Treatment Gait belt;Other (comment)   bilateral AFOs   Activity Tolerance Patient tolerated treatment well    Behavior During Therapy WFL for tasks assessed/performed           Past Medical History:  Diagnosis Date  . Abdominal pain, right upper quadrant   . Back pain   . Calculus of kidney 12/09/2013  . Chronic back pain    unspecified  . Chronic left shoulder pain 07/19/2015  . Functional disorder of bladder    other  . Galactorrhea 11/26/2014   Chronic   . Hereditary and idiopathic neuropathy 08/19/2013  . HPV test positive   . Hypercholesteremia 08/19/2013  . Incomplete bladder emptying   . Microscopic hematuria   . MS (multiple sclerosis) ([place])   . Muscle spasticity 05/21/2014  . Nonspecific findings on examination of urine    other  . Osteopenia   . Status post laparoscopic supracervical hysterectomy 11/26/2014  . Tobacco user 11/26/2014  . Wrist fracture     Past Surgical History:  Procedure Laterality Date  . bilateral tubal ligation  1996  . BREAST CYST EXCISION Left 2002  . KNEE SURGERY     right  . LAPAROSCOPIC SUPRACERVICAL HYSTERECTOMY  08/05/2013  . ORIF WRIST FRACTURE Left 01/17/2017   Procedure: OPEN REDUCTION INTERNAL FIXATION (ORIF) WRIST FRACTURE;  Surgeon: Lovell Sheehan, MD;  Location: [facility] ORS;  Service: Orthopedics;  Laterality: Left;  . TUBAL  LIGATION Bilateral   . VAGINAL HYSTERECTOMY  03/2006    There were no vitals filed for this visit.   Subjective Assessment - 01/22/20 1118    Subjective Patient reports feelign good today, is excited to walk today. No falls or LOB since last session.    Pertinent History Patient is previous patient of this therapist, was admitted to the hospital on 08/03/19 for MS exacerbation and then went to inpatient rehab on 08/08/19. Patient then received home health therapy and now is returning to outpatient PT. PMh includes HTN, SVT, neurogenic bowel, MS (1995), neuropathy, hx of wrist fx, HPV, Hypercholesteremia, adiposity, osteopenia, spasticity. She wears bilateral AFOs and uses a RW and/or wheelchair for mobility. She drives an adapted car.    Limitations Lifting;Standing;Walking;House hold activities    How long can you sit comfortably? n/a    How long can you stand comfortably? 4 minutes    How long can you walk comfortably? walked 50 ft in inpatient rehab    Patient Stated Goals return to PLOF, increase walking, get into shower, increase mobility    Currently in Pain? No/denies              Standing with RW: Weight shift; slight pain in R side noted:   Ambulate with RW with wheelchair follow: close CGA with cues for weight shift and upright posture for  foot clearance 60 ft,20 ft, 30 ft with seated rest breaks. Increased fatigue with increased extremal rotation of RLE.  static stand with SUE support multiple trials 30 seconds each UE.   seated: rainbow ball:  -chest press to SPT; sit edge of w/c upright posture no back support: 15x  -bounce pass to SPT; sit edge of w/c upright posture no back support: 15x  -adduction ball squeeze 10x 3 second holds -overhead raise, half circle L, half circle R 8x with rest after 5x.     Patient utilizes ice pack for reduction of internal temperature to reduce risk of MS exacerbation and allow continuation of progression of interventions.     Pt  educated throughout session about proper posture and technique with exercises. Improved exercise technique, movement at target joints, use of target muscles after min to mod verbal, visual, tactile cues.                         PT Education - 01/22/20 1119    Education provided Yes    Education Details exercise technique, body mechanics    Person(s) Educated Patient    Methods Explanation;Demonstration;Tactile cues;Verbal cues    Comprehension Verbalized understanding;Returned demonstration;Verbal cues required;Tactile cues required            PT Short Term Goals - 01/06/20 1228      PT SHORT TERM GOAL #1   Title Patient will be independent in home exercise program to improve strength/mobility for better functional independence with ADLs.    Baseline 5/19: HEP next session 6/22: HEP compliant 8/10 : HEP progressions compliant 8/31 HEP compliant    Time 4    Period Weeks    Status Partially Met    Target Date 01/13/20      PT SHORT TERM GOAL #2   Title Patient will perform a sit to stand with SUE support for increased ind. with transfers.    Baseline 5/19: heavy BUE support 6/22: requires one hand on walker one hand on w/c 8/10: able to perform intermittently (some days can some days cannot) 8/31: attempt next session    Time 4    Period Weeks    Status Partially Met    Target Date 01/13/20             PT Long Term Goals - 01/06/20 0001      PT LONG TERM GOAL #1   Title Patient will increase FOTO score to equal to or greater than  53/100   to demonstrate statistically significant improvement in mobility and quality of life.     Baseline 8/31: 48.13% 7/27:  48.1% 6/22: 46.99% 5/18: 48/100    Time 12    Period Weeks    Status Partially Met    Target Date 03/09/20      PT LONG TERM GOAL #2   Title Patient (< 68 years old) will complete five times sit to stand test in < 20 seconds indicating an increased LE strength and improved balance.    Baseline  7/27: 26.8 seconds with one partial stand 6/22: 40. 86 sec; unable to reach full stand last two attempts 5/19: 42 seconds     Time 12    Period Weeks    Status Partially Met    Target Date 03/09/20      PT LONG TERM GOAL #3   Title Patient will increase 10 meter walk test to <20 seconds as to improve gait speed for better  community ambulation and to reduce fall risk.    Baseline  8/31: 35.1 seconds with RW 7/27: 36 seconds with RW 6/22: 36.1 seconds 5/19: 42 seconds    Time 12    Period Weeks    Status Partially Met    Target Date 03/09/20      PT LONG TERM GOAL #4   Title Patient will reduce timed up and go to <11 seconds to reduce fall risk and demonstrate improved transfer/gait ability.    Baseline  8/31: 50.8 seconds with RW 7/27: 43.65 6/22: 45.4 seconds 5/19: 47 seconds    Time 12    Period Weeks    Status On-going    Target Date 03/09/20      PT LONG TERM GOAL #5   Title Patient will ambulate from physical therapy gym to elevator down the hall, ride elevator to first floor, and walk to valet with RW and chair follow for increased capacity for mobility and ind.    Baseline 8/31: able to open door ~50 ft with one rest break 8/10: unable to ambulate to elevator without rest break    Time 12    Period Weeks    Status Partially Met    Target Date 03/09/20                 Plan - 01/22/20 1122    Clinical Impression Statement Patient is motivated to progress capacity for functional mobility as she continues to progress duration of ambulation requiring occasional rest breaks due to overheating. Seated strengthening of core progressing with increased reach and pertubation. The patient would benefit from further skilled PT intervention to maximize QOL, safety, and functional mobility    Personal Factors and Comorbidities Age;Comorbidity 3+;Fitness;Past/Current Experience;Sex;Time since onset of injury/illness/exacerbation    Comorbidities HTN, SVT, neurogenic bowel, MS (1995),  neuropathy, hx of wrist fx, HPV, Hypercholesteremia, adiposity, osteopenia, spasticity.    Examination-Activity Limitations Bathing;Bed Mobility;Bend;Carry;Continence;Dressing;Hygiene/Grooming;Stairs;Squat;Reach Overhead;Locomotion Level;Lift;Stand;Transfers    Examination-Participation Restrictions Church;Cleaning;Community Activity;Laundry;Volunteer;Shop;Meal Prep;Yard Work    [employer]chant [employer] officer Evolving/Moderate complexity    Rehab Potential Good    PT Frequency 2x / week    PT Duration 12 weeks    PT Treatment/Interventions ADLs/Self Care Home Management;Aquatic Therapy;Biofeedback;Cryotherapy;Electrical Stimulation;Iontophoresis 88m/ml Dexamethasone;Traction;Moist Heat;Ultrasound;DME Instruction;Gait training;Stair training;Functional mobility training;Therapeutic activities;Therapeutic exercise;Balance training;Neuromuscular re-education;Patient/family education;Orthotic Fit/Training;Wheelchair mobility training;Manual techniques;Compression bandaging;Passive range of motion;Dry needling;Energy conservation;Taping;Splinting;Vestibular    PT Next Visit Plan step negotiation, turning directions to the L and R    PT Home Exercise Plan maintain current HEP from HHPT    Consulted and Agree with Plan of Care Patient           Patient will benefit from skilled therapeutic intervention in order to improve the following deficits and impairments:  Abnormal gait, Decreased activity tolerance, Decreased balance, Decreased coordination, Decreased endurance, Decreased mobility, Decreased range of motion, Decreased strength, Difficulty walking, Impaired perceived functional ability, Impaired sensation, Impaired tone, Impaired UE functional use, Improper body mechanics, Postural dysfunction, Cardiopulmonary status limiting activity, Impaired flexibility, Increased muscle spasms  Visit Diagnosis: Multiple sclerosis exacerbation (HCC)  Muscle weakness (generalized)  Other abnormalities of  gait and mobility  Unsteadiness on feet     Problem List Patient Active Problem List   Diagnosis Date Noted  . Hypoalbuminemia due to protein-calorie malnutrition ([place]   . Neurogenic bowel   . Neurogenic bladder   . Labile blood pressure   . Neuropathic pain   . Abscess of female pelvis   . SVT (supraventricular tachycardia) ([place]   .  Radial styloid tenosynovitis 03/12/2018  . Wheelchair confinement 02/27/2018  . Localized osteoporosis with current pathological fracture with routine healing 01/19/2017  . Wrist fracture 01/16/2017  . Sprain of ankle 03/23/2016  . Closed fracture of lateral malleolus 03/16/2016  . Health care maintenance 01/24/2016  . Blood pressure elevated without history of HTN 10/25/2015  . Essential hypertension 10/25/2015  . Multiple sclerosis ([place]) 10/02/2015  . Chronic left shoulder pain 07/19/2015  . Multiple sclerosis exacerbation ([place]) 07/14/2015  . MS (multiple sclerosis) ([place]) 11/26/2014  . Increased body mass index 11/26/2014  . HPV test positive 11/26/2014  . Status post laparoscopic supracervical hysterectomy 11/26/2014  . Galactorrhea 11/26/2014  . Back ache 05/21/2014  . Adiposity 05/21/2014  . Disordered sleep 05/21/2014  . Muscle spasticity 05/21/2014  . Spasticity 05/21/2014  . Calculus of kidney 12/09/2013  . Renal colic 12/21/8385  . Hypercholesteremia 08/19/2013  . Hereditary and idiopathic neuropathy 08/19/2013  . Hypercholesterolemia without hypertriglyceridemia 08/19/2013  . Bladder infection, chronic 07/25/2012  . Disorder of bladder function 07/25/2012  . Incomplete bladder emptying 07/25/2012  . Microscopic hematuria 07/25/2012  . Right upper quadrant pain 07/25/2012   Janna Arch, PT, DPT    01/22/2020, 11:45 AM  [place] MAIN [facility] SERVICES [address] [place], [place], [address] Phone: [phone]   Fax:  [phone]  Name: Lisa Williams MRN:  [id number] Date of Birth: 11/21/1960

## 2020-01-27 ENCOUNTER — Other Ambulatory Visit: Payer: Self-pay

## 2020-01-27 ENCOUNTER — Ambulatory Visit: Payer: PPO

## 2020-01-27 DIAGNOSIS — G35 Multiple sclerosis: Secondary | ICD-10-CM | POA: Diagnosis not present

## 2020-01-27 DIAGNOSIS — R2681 Unsteadiness on feet: Secondary | ICD-10-CM

## 2020-01-27 DIAGNOSIS — R2689 Other abnormalities of gait and mobility: Secondary | ICD-10-CM

## 2020-01-27 DIAGNOSIS — M6281 Muscle weakness (generalized): Secondary | ICD-10-CM

## 2020-01-27 NOTE — Therapy (Signed)
[place] MAIN [facility] SERVICES [address] [place], [place], [address] Phone: [phone]   Fax:  [phone]  Physical Therapy Treatment  Patient Details  Name: Lisa Williams MRN: [id number] Date of Birth: May 19, 1960 Referring Provider (PT): Gurney Maxin, MD   Encounter Date: 01/27/2020   PT End of Session - 01/27/20 1407    Visit Number 36    Number of Visits 48    Date for PT Re-Evaluation 03/09/20    Authorization Type 6/10 Pn 01/06/20    PT Start Time 1017    PT Stop Time 1059    PT Time Calculation (min) 42 min    Equipment Utilized During Treatment Gait belt;Other (comment)   bilateral AFOs   Activity Tolerance Patient tolerated treatment well    Behavior During Therapy WFL for tasks assessed/performed           Past Medical History:  Diagnosis Date  . Abdominal pain, right upper quadrant   . Back pain   . Calculus of kidney 12/09/2013  . Chronic back pain    unspecified  . Chronic left shoulder pain 07/19/2015  . Functional disorder of bladder    other  . Galactorrhea 11/26/2014   Chronic   . Hereditary and idiopathic neuropathy 08/19/2013  . HPV test positive   . Hypercholesteremia 08/19/2013  . Incomplete bladder emptying   . Microscopic hematuria   . MS (multiple sclerosis) ([place])   . Muscle spasticity 05/21/2014  . Nonspecific findings on examination of urine    other  . Osteopenia   . Status post laparoscopic supracervical hysterectomy 11/26/2014  . Tobacco user 11/26/2014  . Wrist fracture     Past Surgical History:  Procedure Laterality Date  . bilateral tubal ligation  1996  . BREAST CYST EXCISION Left 2002  . KNEE SURGERY     right  . LAPAROSCOPIC SUPRACERVICAL HYSTERECTOMY  08/05/2013  . ORIF WRIST FRACTURE Left 01/17/2017   Procedure: OPEN REDUCTION INTERNAL FIXATION (ORIF) WRIST FRACTURE;  Surgeon: Lovell Sheehan, MD;  Location: [facility] ORS;  Service: Orthopedics;  Laterality: Left;  . TUBAL  LIGATION Bilateral   . VAGINAL HYSTERECTOMY  03/2006    There were no vitals filed for this visit.   Subjective Assessment - 01/27/20 1404    Subjective Patient reports it has been a good morning. No pains reported. no falls or LOB since last session    Pertinent History Patient is previous patient of this therapist, was admitted to the hospital on 08/03/19 for MS exacerbation and then went to inpatient rehab on 08/08/19. Patient then received home health therapy and now is returning to outpatient PT. PMh includes HTN, SVT, neurogenic bowel, MS (1995), neuropathy, hx of wrist fx, HPV, Hypercholesteremia, adiposity, osteopenia, spasticity. She wears bilateral AFOs and uses a RW and/or wheelchair for mobility. She drives an adapted car.    Limitations Lifting;Standing;Walking;House hold activities    How long can you sit comfortably? n/a    How long can you stand comfortably? 4 minutes    How long can you walk comfortably? walked 50 ft in inpatient rehab    Patient Stated Goals return to PLOF, increase walking, get into shower, increase mobility    Currently in Pain? No/denies                  Standing in // bars: Weight shift; standing shift L and R 10x each side  Sit to stand, stabilize then let go of UE support  and pass ball back and forth return to sitting 5x, seated rest break then additional 3 x. For 8x total, very challenging   Stand take a step forward, then back 6x each LE   seated: rainbow ball:  -chest press to SPT; sit edge of w/c upright posture no back support: 15x  -overhead raise, half circle L, half circle R 8x with rest after 5x.    -twists 10x each side   Lateral abduction 10x each side, cues for recentering COM   isometric contraction against PT resistance: -hip flexion 10x 3 second hold -hip abduction 10x 3 second hold -hip adduction 10x 3 second holds   Patient utilizes ice pack for reduction of internal temperature to reduce risk of MS exacerbation and  allow continuation of progression of interventions.     Pt educated throughout session about proper posture and technique with exercises. Improved exercise technique, movement at target joints, use of target muscles after min to mod verbal, visual, tactile cues.                   PT Education - 01/27/20 1404    Education provided Yes    Education Details exercise technique, body mechanics    Person(s) Educated Patient    Methods Explanation;Demonstration;Tactile cues;Verbal cues    Comprehension Verbalized understanding;Returned demonstration;Verbal cues required;Tactile cues required            PT Short Term Goals - 01/06/20 1228      PT SHORT TERM GOAL #1   Title Patient will be independent in home exercise program to improve strength/mobility for better functional independence with ADLs.    Baseline 5/19: HEP next session 6/22: HEP compliant 8/10 : HEP progressions compliant 8/31 HEP compliant    Time 4    Period Weeks    Status Partially Met    Target Date 01/13/20      PT SHORT TERM GOAL #2   Title Patient will perform a sit to stand with SUE support for increased ind. with transfers.    Baseline 5/19: heavy BUE support 6/22: requires one hand on walker one hand on w/c 8/10: able to perform intermittently (some days can some days cannot) 8/31: attempt next session    Time 4    Period Weeks    Status Partially Met    Target Date 01/13/20             PT Long Term Goals - 01/06/20 0001      PT LONG TERM GOAL #1   Title Patient will increase FOTO score to equal to or greater than  53/100   to demonstrate statistically significant improvement in mobility and quality of life.     Baseline 8/31: 48.13% 7/27:  48.1% 6/22: 46.99% 5/18: 48/100    Time 12    Period Weeks    Status Partially Met    Target Date 03/09/20      PT LONG TERM GOAL #2   Title Patient (< 21 years old) will complete five times sit to stand test in < 20 seconds indicating an increased  LE strength and improved balance.    Baseline 7/27: 26.8 seconds with one partial stand 6/22: 40. 86 sec; unable to reach full stand last two attempts 5/19: 42 seconds     Time 12    Period Weeks    Status Partially Met    Target Date 03/09/20      PT LONG TERM GOAL #3   Title Patient  will increase 10 meter walk test to <20 seconds as to improve gait speed for better community ambulation and to reduce fall risk.    Baseline  8/31: 35.1 seconds with RW 7/27: 36 seconds with RW 6/22: 36.1 seconds 5/19: 42 seconds    Time 12    Period Weeks    Status Partially Met    Target Date 03/09/20      PT LONG TERM GOAL #4   Title Patient will reduce timed up and go to <11 seconds to reduce fall risk and demonstrate improved transfer/gait ability.    Baseline  8/31: 50.8 seconds with RW 7/27: 43.65 6/22: 45.4 seconds 5/19: 47 seconds    Time 12    Period Weeks    Status On-going    Target Date 03/09/20      PT LONG TERM GOAL #5   Title Patient will ambulate from physical therapy gym to elevator down the hall, ride elevator to first floor, and walk to valet with RW and chair follow for increased capacity for mobility and ind.    Baseline 8/31: able to open door ~50 ft with one rest break 8/10: unable to ambulate to elevator without rest break    Time 12    Period Weeks    Status Partially Met    Target Date 03/09/20                 Plan - 01/27/20 1408    Clinical Impression Statement Patient is progressing with functional stabilization without UE support despite fatigue with no episodes of instability. Patient is limited by repeated sit to stands followed subsequently by balance. The patient would benefit from further skilled PT intervention to maximize QOL, safety, and functional mobility    Personal Factors and Comorbidities Age;Comorbidity 3+;Fitness;Past/Current Experience;Sex;Time since onset of injury/illness/exacerbation    Comorbidities HTN, SVT, neurogenic bowel, MS (1995),  neuropathy, hx of wrist fx, HPV, Hypercholesteremia, adiposity, osteopenia, spasticity.    Examination-Activity Limitations Bathing;Bed Mobility;Bend;Carry;Continence;Dressing;Hygiene/Grooming;Stairs;Squat;Reach Overhead;Locomotion Level;Lift;Stand;Transfers    Examination-Participation Restrictions Church;Cleaning;Community Activity;Laundry;Volunteer;Shop;Meal Prep;Yard Work    [employer] officer Evolving/Moderate complexity    Rehab Potential Good    PT Frequency 2x / week    PT Duration 12 weeks    PT Treatment/Interventions ADLs/Self Care Home Management;Aquatic Therapy;Biofeedback;Cryotherapy;Electrical Stimulation;Iontophoresis 71m/ml Dexamethasone;Traction;Moist Heat;Ultrasound;DME Instruction;Gait training;Stair training;Functional mobility training;Therapeutic activities;Therapeutic exercise;Balance training;Neuromuscular re-education;Patient/family education;Orthotic Fit/Training;Wheelchair mobility training;Manual techniques;Compression bandaging;Passive range of motion;Dry needling;Energy conservation;Taping;Splinting;Vestibular    PT Next Visit Plan step negotiation, turning directions to the L and R    PT Home Exercise Plan maintain current HEP from HHPT    Consulted and Agree with Plan of Care Patient           Patient will benefit from skilled therapeutic intervention in order to improve the following deficits and impairments:  Abnormal gait, Decreased activity tolerance, Decreased balance, Decreased coordination, Decreased endurance, Decreased mobility, Decreased range of motion, Decreased strength, Difficulty walking, Impaired perceived functional ability, Impaired sensation, Impaired tone, Impaired UE functional use, Improper body mechanics, Postural dysfunction, Cardiopulmonary status limiting activity, Impaired flexibility, Increased muscle spasms  Visit Diagnosis: Multiple sclerosis exacerbation (H[facility])  Muscle weakness (generalized)  Other abnormalities of  gait and mobility  Unsteadiness on feet     Problem List Patient Active Problem List   Diagnosis Date Noted  . Hypoalbuminemia due to protein-calorie malnutrition ([place]   . Neurogenic bowel   . Neurogenic bladder   . Labile blood pressure   . Neuropathic pain   . Abscess of  female pelvis   . SVT (supraventricular tachycardia) ([place])   . Radial styloid tenosynovitis 03/12/2018  . Wheelchair confinement 02/27/2018  . Localized osteoporosis with current pathological fracture with routine healing 01/19/2017  . Wrist fracture 01/16/2017  . Sprain of ankle 03/23/2016  . Closed fracture of lateral malleolus 03/16/2016  . Health care maintenance 01/24/2016  . Blood pressure elevated without history of HTN 10/25/2015  . Essential hypertension 10/25/2015  . Multiple sclerosis ([place]) 10/02/2015  . Chronic left shoulder pain 07/19/2015  . Multiple sclerosis exacerbation ([place]) 07/14/2015  . MS (multiple sclerosis) ([place]) 11/26/2014  . Increased body mass index 11/26/2014  . HPV test positive 11/26/2014  . Status post laparoscopic supracervical hysterectomy 11/26/2014  . Galactorrhea 11/26/2014  . Back ache 05/21/2014  . Adiposity 05/21/2014  . Disordered sleep 05/21/2014  . Muscle spasticity 05/21/2014  . Spasticity 05/21/2014  . Calculus of kidney 12/09/2013  . Renal colic 31/51/7616  . Hypercholesteremia 08/19/2013  . Hereditary and idiopathic neuropathy 08/19/2013  . Hypercholesterolemia without hypertriglyceridemia 08/19/2013  . Bladder infection, chronic 07/25/2012  . Disorder of bladder function 07/25/2012  . Incomplete bladder emptying 07/25/2012  . Microscopic hematuria 07/25/2012  . Right upper quadrant pain 07/25/2012   Janna Arch, PT, DPT   01/27/2020, 2:09 PM  [place] MAIN [facility] SERVICES [address] [place], [place], [address] Phone: [phone]   Fax:  [phone]  Name: Lisa Williams MRN: [id number] Date  of Birth: [birth year]

## 2020-01-29 ENCOUNTER — Other Ambulatory Visit: Payer: Self-pay

## 2020-01-29 ENCOUNTER — Ambulatory Visit: Payer: PPO

## 2020-01-29 DIAGNOSIS — G35 Multiple sclerosis: Secondary | ICD-10-CM

## 2020-01-29 DIAGNOSIS — R2681 Unsteadiness on feet: Secondary | ICD-10-CM

## 2020-01-29 DIAGNOSIS — M6281 Muscle weakness (generalized): Secondary | ICD-10-CM

## 2020-01-29 DIAGNOSIS — R2689 Other abnormalities of gait and mobility: Secondary | ICD-10-CM

## 2020-01-29 NOTE — Therapy (Signed)
[place] MAIN [facility] SERVICES [address] [place], [place], [address] Phone: [phone]   Fax:  [phone]  Physical Therapy Treatment  Patient Details  Name: Lisa Williams MRN: [id number] Date of Birth: 02/28/1961 Referring Provider (PT): Gurney Maxin, MD   Encounter Date: 01/29/2020   PT End of Session - 01/29/20 1233    Visit Number 37    Number of Visits 48    Date for PT Re-Evaluation 03/09/20    Authorization Type 7/10 Pn 01/06/20    PT Start Time 1015    PT Stop Time 1058    PT Time Calculation (min) 43 min    Equipment Utilized During Treatment Gait belt;Other (comment)   bilateral AFOs   Activity Tolerance Patient tolerated treatment well    Behavior During Therapy WFL for tasks assessed/performed           Past Medical History:  Diagnosis Date  . Abdominal pain, right upper quadrant   . Back pain   . Calculus of kidney 12/09/2013  . Chronic back pain    unspecified  . Chronic left shoulder pain 07/19/2015  . Functional disorder of bladder    other  . Galactorrhea 11/26/2014   Chronic   . Hereditary and idiopathic neuropathy 08/19/2013  . HPV test positive   . Hypercholesteremia 08/19/2013  . Incomplete bladder emptying   . Microscopic hematuria   . MS (multiple sclerosis) ([place])   . Muscle spasticity 05/21/2014  . Nonspecific findings on examination of urine    other  . Osteopenia   . Status post laparoscopic supracervical hysterectomy 11/26/2014  . Tobacco user 11/26/2014  . Wrist fracture     Past Surgical History:  Procedure Laterality Date  . bilateral tubal ligation  1996  . BREAST CYST EXCISION Left 2002  . KNEE SURGERY     right  . LAPAROSCOPIC SUPRACERVICAL HYSTERECTOMY  08/05/2013  . ORIF WRIST FRACTURE Left 01/17/2017   Procedure: OPEN REDUCTION INTERNAL FIXATION (ORIF) WRIST FRACTURE;  Surgeon: Lovell Sheehan, MD;  Location: [facility] ORS;  Service: Orthopedics;  Laterality: Left;  . TUBAL  LIGATION Bilateral   . VAGINAL HYSTERECTOMY  03/2006    There were no vitals filed for this visit.   Subjective Assessment - 01/29/20 1231    Subjective Patient reports having a good day, enjoying the colder weather. No falls or LOB since last session. Is finding it easier to use the bathroom in public areas.    Pertinent History Patient is previous patient of this therapist, was admitted to the hospital on 08/03/19 for MS exacerbation and then went to inpatient rehab on 08/08/19. Patient then received home health therapy and now is returning to outpatient PT. PMh includes HTN, SVT, neurogenic bowel, MS (1995), neuropathy, hx of wrist fx, HPV, Hypercholesteremia, adiposity, osteopenia, spasticity. She wears bilateral AFOs and uses a RW and/or wheelchair for mobility. She drives an adapted car.    Limitations Lifting;Standing;Walking;House hold activities    How long can you sit comfortably? n/a    How long can you stand comfortably? 4 minutes    How long can you walk comfortably? walked 50 ft in inpatient rehab    Patient Stated Goals return to PLOF, increase walking, get into shower, increase mobility    Currently in Pain? No/denies                   standing:  Ambulate with RW and wheelchair follow, negotiate one turn to the R  around a corner, one seated rest break after ~45 ft , ambulate additional 50 ft after rest break to reach end of hallway for progression towards ability to ambulate from gym to elevator.   Seated: 5lb bar: -chest press 10x, cues for scapular retraction  -alternating row single arm pull back, cues for scapular retraction 10x each side -overhead press 10x -bar vertical, clockwise circle 10x, counterclockwise 10x   hamstring curl isometric 12x each LE 3 second holds Hip flexion isometric against PT resistance 12x each LE 3 second holds  Scapular retractions 10x with tactile cueing for retraction and depression rather than compensatory elevation   Seated  trunk extension and return to COM 8x, very challenging for patient.     Patient utilizes ice pack for reduction of internal temperature to reduce risk of MS exacerbation and allow continuation of progression of interventions.     Pt educated throughout session about proper posture and technique with exercises. Improved exercise technique, movement at target joints, use of target muscles after min to mod verbal, visual, tactile cues.             PT Education - 01/29/20 1232    Education provided Yes    Education Details exercise technique, body mechanics    Person(s) Educated Patient    Methods Explanation;Demonstration;Tactile cues;Verbal cues    Comprehension Verbalized understanding;Returned demonstration;Verbal cues required;Tactile cues required            PT Short Term Goals - 01/06/20 1228      PT SHORT TERM GOAL #1   Title Patient will be independent in home exercise program to improve strength/mobility for better functional independence with ADLs.    Baseline 5/19: HEP next session 6/22: HEP compliant 8/10 : HEP progressions compliant 8/31 HEP compliant    Time 4    Period Weeks    Status Partially Met    Target Date 01/13/20      PT SHORT TERM GOAL #2   Title Patient will perform a sit to stand with SUE support for increased ind. with transfers.    Baseline 5/19: heavy BUE support 6/22: requires one hand on walker one hand on w/c 8/10: able to perform intermittently (some days can some days cannot) 8/31: attempt next session    Time 4    Period Weeks    Status Partially Met    Target Date 01/13/20             PT Long Term Goals - 01/06/20 0001      PT LONG TERM GOAL #1   Title Patient will increase FOTO score to equal to or greater than  53/100   to demonstrate statistically significant improvement in mobility and quality of life.     Baseline 8/31: 48.13% 7/27:  48.1% 6/22: 46.99% 5/18: 48/100    Time 12    Period Weeks    Status Partially Met     Target Date 03/09/20      PT LONG TERM GOAL #2   Title Patient (< 99 years old) will complete five times sit to stand test in < 20 seconds indicating an increased LE strength and improved balance.    Baseline 7/27: 26.8 seconds with one partial stand 6/22: 40. 86 sec; unable to reach full stand last two attempts 5/19: 42 seconds     Time 12    Period Weeks    Status Partially Met    Target Date 03/09/20      PT LONG TERM  GOAL #3   Title Patient will increase 10 meter walk test to <20 seconds as to improve gait speed for better community ambulation and to reduce fall risk.    Baseline  8/31: 35.1 seconds with RW 7/27: 36 seconds with RW 6/22: 36.1 seconds 5/19: 42 seconds    Time 12    Period Weeks    Status Partially Met    Target Date 03/09/20      PT LONG TERM GOAL #4   Title Patient will reduce timed up and go to <11 seconds to reduce fall risk and demonstrate improved transfer/gait ability.    Baseline  8/31: 50.8 seconds with RW 7/27: 43.65 6/22: 45.4 seconds 5/19: 47 seconds    Time 12    Period Weeks    Status On-going    Target Date 03/09/20      PT LONG TERM GOAL #5   Title Patient will ambulate from physical therapy gym to elevator down the hall, ride elevator to first floor, and walk to valet with RW and chair follow for increased capacity for mobility and ind.    Baseline 8/31: able to open door ~50 ft with one rest break 8/10: unable to ambulate to elevator without rest break    Time 12    Period Weeks    Status Partially Met    Target Date 03/09/20                 Plan - 01/29/20 1235    Clinical Impression Statement Patient demonstrates excellent progression towards functional mobility and ambulation with increased stability when negotiating turns and hallways with people/distractions. UE and core stabilization improving with decreased need for rest and increased resistance tolerated. The patient would benefit from further skilled PT intervention to maximize  QOL, safety, and functional mobility    Personal Factors and Comorbidities Age;Comorbidity 3+;Fitness;Past/Current Experience;Sex;Time since onset of injury/illness/exacerbation    Comorbidities HTN, SVT, neurogenic bowel, MS (1995), neuropathy, hx of wrist fx, HPV, Hypercholesteremia, adiposity, osteopenia, spasticity.    Examination-Activity Limitations Bathing;Bed Mobility;Bend;Carry;Continence;Dressing;Hygiene/Grooming;Stairs;Squat;Reach Overhead;Locomotion Level;Lift;Stand;Transfers    Examination-Participation Restrictions Church;Cleaning;Community Activity;Laundry;Volunteer;Shop;Meal Prep;Yard Work    [employer]chant [employer] officer Evolving/Moderate complexity    Rehab Potential Good    PT Frequency 2x / week    PT Duration 12 weeks    PT Treatment/Interventions ADLs/Self Care Home Management;Aquatic Therapy;Biofeedback;Cryotherapy;Electrical Stimulation;Iontophoresis 21m/ml Dexamethasone;Traction;Moist Heat;Ultrasound;DME Instruction;Gait training;Stair training;Functional mobility training;Therapeutic activities;Therapeutic exercise;Balance training;Neuromuscular re-education;Patient/family education;Orthotic Fit/Training;Wheelchair mobility training;Manual techniques;Compression bandaging;Passive range of motion;Dry needling;Energy conservation;Taping;Splinting;Vestibular    PT Next Visit Plan step negotiation, turning directions to the L and R    PT Home Exercise Plan maintain current HEP from HHPT    Consulted and Agree with Plan of Care Patient           Patient will benefit from skilled therapeutic intervention in order to improve the following deficits and impairments:  Abnormal gait, Decreased activity tolerance, Decreased balance, Decreased coordination, Decreased endurance, Decreased mobility, Decreased range of motion, Decreased strength, Difficulty walking, Impaired perceived functional ability, Impaired sensation, Impaired tone, Impaired UE functional use, Improper body  mechanics, Postural dysfunction, Cardiopulmonary status limiting activity, Impaired flexibility, Increased muscle spasms  Visit Diagnosis: Multiple sclerosis exacerbation (HCC)  Muscle weakness (generalized)  Other abnormalities of gait and mobility  Unsteadiness on feet     Problem List Patient Active Problem List   Diagnosis Date Noted  . Hypoalbuminemia due to protein-calorie malnutrition ([place]   . Neurogenic bowel   . Neurogenic bladder   . Labile blood  pressure   . Neuropathic pain   . Abscess of female pelvis   . SVT (supraventricular tachycardia) ([place])   . Radial styloid tenosynovitis 03/12/2018  . Wheelchair confinement 02/27/2018  . Localized osteoporosis with current pathological fracture with routine healing 01/19/2017  . Wrist fracture 01/16/2017  . Sprain of ankle 03/23/2016  . Closed fracture of lateral malleolus 03/16/2016  . Health care maintenance 01/24/2016  . Blood pressure elevated without history of HTN 10/25/2015  . Essential hypertension 10/25/2015  . Multiple sclerosis ([place]) 10/02/2015  . Chronic left shoulder pain 07/19/2015  . Multiple sclerosis exacerbation ([place]) 07/14/2015  . MS (multiple sclerosis) ([place]) 11/26/2014  . Increased body mass index 11/26/2014  . HPV test positive 11/26/2014  . Status post laparoscopic supracervical hysterectomy 11/26/2014  . Galactorrhea 11/26/2014  . Back ache 05/21/2014  . Adiposity 05/21/2014  . Disordered sleep 05/21/2014  . Muscle spasticity 05/21/2014  . Spasticity 05/21/2014  . Calculus of kidney 12/09/2013  . Renal colic 07/37/1062  . Hypercholesteremia 08/19/2013  . Hereditary and idiopathic neuropathy 08/19/2013  . Hypercholesterolemia without hypertriglyceridemia 08/19/2013  . Bladder infection, chronic 07/25/2012  . Disorder of bladder function 07/25/2012  . Incomplete bladder emptying 07/25/2012  . Microscopic hematuria 07/25/2012  . Right upper quadrant pain 07/25/2012   Janna Arch, PT,  DPT   01/29/2020, 12:37 PM  [place] MAIN [facility] SERVICES [address] [place], [place], [address] Phone: [phone]   Fax:  [phone]  Name: Lisa Williams MRN: [id number] Date of Birth: June 22, 1960

## 2020-02-03 ENCOUNTER — Ambulatory Visit: Payer: PPO

## 2020-02-03 ENCOUNTER — Other Ambulatory Visit: Payer: Self-pay

## 2020-02-03 DIAGNOSIS — R2681 Unsteadiness on feet: Secondary | ICD-10-CM

## 2020-02-03 DIAGNOSIS — G35 Multiple sclerosis: Secondary | ICD-10-CM | POA: Diagnosis not present

## 2020-02-03 DIAGNOSIS — R2689 Other abnormalities of gait and mobility: Secondary | ICD-10-CM

## 2020-02-03 DIAGNOSIS — M6281 Muscle weakness (generalized): Secondary | ICD-10-CM

## 2020-02-03 NOTE — Therapy (Signed)
[place] MAIN [facility] SERVICES [address] [place], [place], [address] Phone: [phone]   Fax:  [phone]  Physical Therapy Treatment  Patient Details  Name: Lisa Williams MRN: [id number] Date of Birth: [birth year] Referring Provider (PT): Gurney Maxin, MD   Encounter Date: 02/03/2020   PT End of Session - 02/03/20 1127    Visit Number 38    Number of Visits 48    Date for PT Re-Evaluation 03/09/20    Authorization Type 8/10 Pn 01/06/20    PT Start Time 1015    PT Stop Time 1059    PT Time Calculation (min) 44 min    Equipment Utilized During Treatment Gait belt;Other (comment)   bilateral AFOs   Activity Tolerance Patient tolerated treatment well    Behavior During Therapy WFL for tasks assessed/performed           Past Medical History:  Diagnosis Date  . Abdominal pain, right upper quadrant   . Back pain   . Calculus of kidney 12/09/2013  . Chronic back pain    unspecified  . Chronic left shoulder pain 07/19/2015  . Functional disorder of bladder    other  . Galactorrhea 11/26/2014   Chronic   . Hereditary and idiopathic neuropathy 08/19/2013  . HPV test positive   . Hypercholesteremia 08/19/2013  . Incomplete bladder emptying   . Microscopic hematuria   . MS (multiple sclerosis) ([place])   . Muscle spasticity 05/21/2014  . Nonspecific findings on examination of urine    other  . Osteopenia   . Status post laparoscopic supracervical hysterectomy 11/26/2014  . Tobacco user 11/26/2014  . Wrist fracture     Past Surgical History:  Procedure Laterality Date  . bilateral tubal ligation  1996  . BREAST CYST EXCISION Left 2002  . KNEE SURGERY     right  . LAPAROSCOPIC SUPRACERVICAL HYSTERECTOMY  08/05/2013  . ORIF WRIST FRACTURE Left 01/17/2017   Procedure: OPEN REDUCTION INTERNAL FIXATION (ORIF) WRIST FRACTURE;  Surgeon: Lovell Sheehan, MD;  Location: [facility] ORS;  Service: Orthopedics;  Laterality: Left;  . TUBAL  LIGATION Bilateral   . VAGINAL HYSTERECTOMY  03/2006    There were no vitals filed for this visit.   Subjective Assessment - 02/03/20 1125    Subjective Patient reports having a good day. Has been compliant with HEP, no falls or LOB since last session.    Pertinent History Patient is previous patient of this therapist, was admitted to the hospital on 08/03/19 for MS exacerbation and then went to inpatient rehab on 08/08/19. Patient then received home health therapy and now is returning to outpatient PT. PMh includes HTN, SVT, neurogenic bowel, MS (1995), neuropathy, hx of wrist fx, HPV, Hypercholesteremia, adiposity, osteopenia, spasticity. She wears bilateral AFOs and uses a RW and/or wheelchair for mobility. She drives an adapted car.    Limitations Lifting;Standing;Walking;House hold activities    How long can you sit comfortably? n/a    How long can you stand comfortably? 4 minutes    How long can you walk comfortably? walked 50 ft in inpatient rehab    Patient Stated Goals return to PLOF, increase walking, get into shower, increase mobility    Currently in Pain? No/denies              in // bars: Single arm punch 10-15x with SUE to no UE support, seated rest breaks between arms.  Forward/backwards ambulation cues for LE progression rather than arm propulsion  4x length of // bars, seated rest breaks.  Weight shift; standing shift L and R 10x each side  STS transfer with walker to plinth table; CGA, slide transfer to return to chair to the R, CGA  Raised plinth table:  -RTB resisted hamstring curl 12x each LE ; rest break between -Seated trunk extension and return to COM 10x, very challenging for patient.  -lateral seated trunk side bend to reach forearm to table and return to seated upright posture. 8x each side, very challenging for patient.   Seated in w/c:  green swiss ball: -TrA contraction pressing into swiss ball with BUE and BLE 10x 3 second hold -unilateral UE raise  with TrA contraction pressing with opp UE 10x each UE -swiss ball twist alternating hand pattern for core and shoulder activation 10x each direction -forward trunk with shoulder protraction, retraction 20x    Patient utilizes ice pack for reduction of internal temperature to reduce risk of MS exacerbation and allow continuation of progression of interventions.  Pt educated throughout session about proper posture and technique with exercises. Improved exercise technique, movement at target joints, use of target muscles after min to mod verbal, visual, tactile cues.                  PT Education - 02/03/20 1125    Education provided Yes    Education Details exercise technique, body mechanics, stabilization    Person(s) Educated Patient    Methods Explanation;Demonstration;Tactile cues;Verbal cues    Comprehension Verbalized understanding;Returned demonstration;Verbal cues required;Tactile cues required            PT Short Term Goals - 01/06/20 1228      PT SHORT TERM GOAL #1   Title Patient will be independent in home exercise program to improve strength/mobility for better functional independence with ADLs.    Baseline 5/19: HEP next session 6/22: HEP compliant 8/10 : HEP progressions compliant 8/31 HEP compliant    Time 4    Period Weeks    Status Partially Met    Target Date 01/13/20      PT SHORT TERM GOAL #2   Title Patient will perform a sit to stand with SUE support for increased ind. with transfers.    Baseline 5/19: heavy BUE support 6/22: requires one hand on walker one hand on w/c 8/10: able to perform intermittently (some days can some days cannot) 8/31: attempt next session    Time 4    Period Weeks    Status Partially Met    Target Date 01/13/20             PT Long Term Goals - 01/06/20 0001      PT LONG TERM GOAL #1   Title Patient will increase FOTO score to equal to or greater than  53/100   to demonstrate statistically significant  improvement in mobility and quality of life.     Baseline 8/31: 48.13% 7/27:  48.1% 6/22: 46.99% 5/18: 48/100    Time 12    Period Weeks    Status Partially Met    Target Date 03/09/20      PT LONG TERM GOAL #2   Title Patient (< 41 years old) will complete five times sit to stand test in < 20 seconds indicating an increased LE strength and improved balance.    Baseline 7/27: 26.8 seconds with one partial stand 6/22: 40. 86 sec; unable to reach full stand last two attempts 5/19: 42 seconds  Time 12    Period Weeks    Status Partially Met    Target Date 03/09/20      PT LONG TERM GOAL #3   Title Patient will increase 10 meter walk test to <20 seconds as to improve gait speed for better community ambulation and to reduce fall risk.    Baseline  8/31: 35.1 seconds with RW 7/27: 36 seconds with RW 6/22: 36.1 seconds 5/19: 42 seconds    Time 12    Period Weeks    Status Partially Met    Target Date 03/09/20      PT LONG TERM GOAL #4   Title Patient will reduce timed up and go to <11 seconds to reduce fall risk and demonstrate improved transfer/gait ability.    Baseline  8/31: 50.8 seconds with RW 7/27: 43.65 6/22: 45.4 seconds 5/19: 47 seconds    Time 12    Period Weeks    Status On-going    Target Date 03/09/20      PT LONG TERM GOAL #5   Title Patient will ambulate from physical therapy gym to elevator down the hall, ride elevator to first floor, and walk to valet with RW and chair follow for increased capacity for mobility and ind.    Baseline 8/31: able to open door ~50 ft with one rest break 8/10: unable to ambulate to elevator without rest break    Time 12    Period Weeks    Status Partially Met    Target Date 03/09/20                 Plan - 02/03/20 1128    Clinical Impression Statement Patient is progressing with functional stabilization interventions in standing and sitting with increased reach outside of BOS without UE support. Patient is able to contract LE  musculature with more frequent subsequent attempts compared to previous sessions. The patient would benefit from further skilled PT intervention to maximize QOL, safety, and functional mobility    Personal Factors and Comorbidities Age;Comorbidity 3+;Fitness;Past/Current Experience;Sex;Time since onset of injury/illness/exacerbation    Comorbidities HTN, SVT, neurogenic bowel, MS (1995), neuropathy, hx of wrist fx, HPV, Hypercholesteremia, adiposity, osteopenia, spasticity.    Examination-Activity Limitations Bathing;Bed Mobility;Bend;Carry;Continence;Dressing;Hygiene/Grooming;Stairs;Squat;Reach Overhead;Locomotion Level;Lift;Stand;Transfers    Examination-Participation Restrictions Church;Cleaning;Community Activity;Laundry;Volunteer;Shop;Meal Prep;Yard Work    [employer] officer Evolving/Moderate complexity    Rehab Potential Good    PT Frequency 2x / week    PT Duration 12 weeks    PT Treatment/Interventions ADLs/Self Care Home Management;Aquatic Therapy;Biofeedback;Cryotherapy;Electrical Stimulation;Iontophoresis 40m/ml Dexamethasone;Traction;Moist Heat;Ultrasound;DME Instruction;Gait training;Stair training;Functional mobility training;Therapeutic activities;Therapeutic exercise;Balance training;Neuromuscular re-education;Patient/family education;Orthotic Fit/Training;Wheelchair mobility training;Manual techniques;Compression bandaging;Passive range of motion;Dry needling;Energy conservation;Taping;Splinting;Vestibular    PT Next Visit Plan step negotiation, turning directions to the L and R    PT Home Exercise Plan maintain current HEP from HHPT    Consulted and Agree with Plan of Care Patient           Patient will benefit from skilled therapeutic intervention in order to improve the following deficits and impairments:  Abnormal gait, Decreased activity tolerance, Decreased balance, Decreased coordination, Decreased endurance, Decreased mobility, Decreased range of motion,  Decreased strength, Difficulty walking, Impaired perceived functional ability, Impaired sensation, Impaired tone, Impaired UE functional use, Improper body mechanics, Postural dysfunction, Cardiopulmonary status limiting activity, Impaired flexibility, Increased muscle spasms  Visit Diagnosis: Multiple sclerosis exacerbation (HCC)  Muscle weakness (generalized)  Other abnormalities of gait and mobility  Unsteadiness on feet     Problem List Patient Active  Problem List   Diagnosis Date Noted  . Hypoalbuminemia due to protein-calorie malnutrition ([place])   . Neurogenic bowel   . Neurogenic bladder   . Labile blood pressure   . Neuropathic pain   . Abscess of female pelvis   . SVT (supraventricular tachycardia) ([place])   . Radial styloid tenosynovitis 03/12/2018  . Wheelchair confinement 02/27/2018  . Localized osteoporosis with current pathological fracture with routine healing 01/19/2017  . Wrist fracture 01/16/2017  . Sprain of ankle 03/23/2016  . Closed fracture of lateral malleolus 03/16/2016  . Health care maintenance 01/24/2016  . Blood pressure elevated without history of HTN 10/25/2015  . Essential hypertension 10/25/2015  . Multiple sclerosis ([place]) 10/02/2015  . Chronic left shoulder pain 07/19/2015  . Multiple sclerosis exacerbation ([place]) 07/14/2015  . MS (multiple sclerosis) ([place]) 11/26/2014  . Increased body mass index 11/26/2014  . HPV test positive 11/26/2014  . Status post laparoscopic supracervical hysterectomy 11/26/2014  . Galactorrhea 11/26/2014  . Back ache 05/21/2014  . Adiposity 05/21/2014  . Disordered sleep 05/21/2014  . Muscle spasticity 05/21/2014  . Spasticity 05/21/2014  . Calculus of kidney 12/09/2013  . Renal colic 34/35/6861  . Hypercholesteremia 08/19/2013  . Hereditary and idiopathic neuropathy 08/19/2013  . Hypercholesterolemia without hypertriglyceridemia 08/19/2013  . Bladder infection, chronic 07/25/2012  . Disorder of bladder  function 07/25/2012  . Incomplete bladder emptying 07/25/2012  . Microscopic hematuria 07/25/2012  . Right upper quadrant pain 07/25/2012   Janna Arch, PT, DPT   02/03/2020, 11:31 AM  [place] MAIN [facility] SERVICES [address] [place], [place], [address] Phone: [phone]   Fax:  [phone]  Name: Lisa Williams MRN: [id number] Date of Birth: 03/29/1961

## 2020-02-05 ENCOUNTER — Ambulatory Visit: Payer: PPO

## 2020-02-05 ENCOUNTER — Other Ambulatory Visit: Payer: Self-pay

## 2020-02-05 DIAGNOSIS — M6281 Muscle weakness (generalized): Secondary | ICD-10-CM

## 2020-02-05 DIAGNOSIS — R2681 Unsteadiness on feet: Secondary | ICD-10-CM

## 2020-02-05 DIAGNOSIS — G35 Multiple sclerosis: Secondary | ICD-10-CM | POA: Diagnosis not present

## 2020-02-05 DIAGNOSIS — R2689 Other abnormalities of gait and mobility: Secondary | ICD-10-CM

## 2020-02-05 NOTE — Therapy (Signed)
[place] MAIN [facility] SERVICES [place], [place], [address] Phone: [phone]   Fax:  [phone]  Physical Therapy Treatment  Patient Details  Name: Lisa Williams MRN: [id number] Date of Birth: 1960-10-04 Referring Provider (PT): Gurney Maxin, MD   Encounter Date: 02/05/2020   PT End of Session - 02/05/20 1230    Visit Number 39    Number of Visits 48    Date for PT Re-Evaluation 03/09/20    Authorization Type 9/10 Pn 01/06/20    PT Start Time 1015    PT Stop Time 1059    PT Time Calculation (min) 44 min    Equipment Utilized During Treatment Gait belt;Other (comment)   bilateral AFOs   Activity Tolerance Patient tolerated treatment well    Behavior During Therapy WFL for tasks assessed/performed           Past Medical History:  Diagnosis Date  . Abdominal pain, right upper quadrant   . Back pain   . Calculus of kidney 12/09/2013  . Chronic back pain    unspecified  . Chronic left shoulder pain 07/19/2015  . Functional disorder of bladder    other  . Galactorrhea 11/26/2014   Chronic   . Hereditary and idiopathic neuropathy 08/19/2013  . HPV test positive   . Hypercholesteremia 08/19/2013  . Incomplete bladder emptying   . Microscopic hematuria   . MS (multiple sclerosis) ([place])   . Muscle spasticity 05/21/2014  . Nonspecific findings on examination of urine    other  . Osteopenia   . Status post laparoscopic supracervical hysterectomy 11/26/2014  . Tobacco user 11/26/2014  . Wrist fracture     Past Surgical History:  Procedure Laterality Date  . bilateral tubal ligation  1996  . BREAST CYST EXCISION Left 2002  . KNEE SURGERY     right  . LAPAROSCOPIC SUPRACERVICAL HYSTERECTOMY  08/05/2013  . ORIF WRIST FRACTURE Left 01/17/2017   Procedure: OPEN REDUCTION INTERNAL FIXATION (ORIF) WRIST FRACTURE;  Surgeon: Lovell Sheehan, MD;  Location: [facility] ORS;  Service: Orthopedics;  Laterality: Left;  . TUBAL  LIGATION Bilateral   . VAGINAL HYSTERECTOMY  03/2006    There were no vitals filed for this visit.   Subjective Assessment - 02/05/20 1228    Subjective Patient reports having a good day due to the mild weather. Was very exhausted after last session.  Has been compliant with HEP, no falls or LOB since last session.    Pertinent History Patient is previous patient of this therapist, was admitted to the hospital on 08/03/19 for MS exacerbation and then went to inpatient rehab on 08/08/19. Patient then received home health therapy and now is returning to outpatient PT. PMh includes HTN, SVT, neurogenic bowel, MS (1995), neuropathy, hx of wrist fx, HPV, Hypercholesteremia, adiposity, osteopenia, spasticity. She wears bilateral AFOs and uses a RW and/or wheelchair for mobility. She drives an adapted car.    Limitations Lifting;Standing;Walking;House hold activities    How long can you sit comfortably? n/a    How long can you stand comfortably? 4 minutes    How long can you walk comfortably? walked 50 ft in inpatient rehab    Patient Stated Goals return to PLOF, increase walking, get into shower, increase mobility    Currently in Pain? No/denies                Standing:   Ambulate with RW and wheelchair follow, 189.10 ft with three rest breaks  in ~32 minutes  From therapy room through doors, around two corners, pressing onto open door, down halway to entry way of elevator forprogression towards ability to ambulate from gym to elevator.    Seated: TrA contraction 12x 3 second holds.  Hamstring lengthening 60 seconds x 2 trials with overpressure each LE Lateral touches and return to COM x 8 each side    Patient utilizes ice pack for reduction of internal temperature to reduce risk of MS exacerbation and allow continuation of progression of interventions.     Pt educated throughout session about proper posture and technique with exercises. Improved exercise technique, movement at target  joints, use of target muscles after min to mod verbal, visual, tactile cues.                     PT Education - 02/05/20 1229    Education provided Yes    Education Details exercise technique, body mechanics, ambulation capacity    Person(s) Educated Patient    Methods Explanation;Demonstration;Tactile cues;Verbal cues    Comprehension Verbalized understanding;Returned demonstration;Verbal cues required;Tactile cues required            PT Short Term Goals - 01/06/20 1228      PT SHORT TERM GOAL #1   Title Patient will be independent in home exercise program to improve strength/mobility for better functional independence with ADLs.    Baseline 5/19: HEP next session 6/22: HEP compliant 8/10 : HEP progressions compliant 8/31 HEP compliant    Time 4    Period Weeks    Status Partially Met    Target Date 01/13/20      PT SHORT TERM GOAL #2   Title Patient will perform a sit to stand with SUE support for increased ind. with transfers.    Baseline 5/19: heavy BUE support 6/22: requires one hand on walker one hand on w/c 8/10: able to perform intermittently (some days can some days cannot) 8/31: attempt next session    Time 4    Period Weeks    Status Partially Met    Target Date 01/13/20             PT Long Term Goals - 01/06/20 0001      PT LONG TERM GOAL #1   Title Patient will increase FOTO score to equal to or greater than  53/100   to demonstrate statistically significant improvement in mobility and quality of life.     Baseline 8/31: 48.13% 7/27:  48.1% 6/22: 46.99% 5/18: 48/100    Time 12    Period Weeks    Status Partially Met    Target Date 03/09/20      PT LONG TERM GOAL #2   Title Patient (< 65 years old) will complete five times sit to stand test in < 20 seconds indicating an increased LE strength and improved balance.    Baseline 7/27: 26.8 seconds with one partial stand 6/22: 40. 86 sec; unable to reach full stand last two attempts 5/19: 42  seconds     Time 12    Period Weeks    Status Partially Met    Target Date 03/09/20      PT LONG TERM GOAL #3   Title Patient will increase 10 meter walk test to <20 seconds as to improve gait speed for better community ambulation and to reduce fall risk.    Baseline  8/31: 35.1 seconds with RW 7/27: 36 seconds with RW 6/22: 36.1 seconds 5/19:  42 seconds    Time 12    Period Weeks    Status Partially Met    Target Date 03/09/20      PT LONG TERM GOAL #4   Title Patient will reduce timed up and go to <11 seconds to reduce fall risk and demonstrate improved transfer/gait ability.    Baseline  8/31: 50.8 seconds with RW 7/27: 43.65 6/22: 45.4 seconds 5/19: 47 seconds    Time 12    Period Weeks    Status On-going    Target Date 03/09/20      PT LONG TERM GOAL #5   Title Patient will ambulate from physical therapy gym to elevator down the hall, ride elevator to first floor, and walk to valet with RW and chair follow for increased capacity for mobility and ind.    Baseline 8/31: able to open door ~50 ft with one rest break 8/10: unable to ambulate to elevator without rest break    Time 12    Period Weeks    Status Partially Met    Target Date 03/09/20                 Plan - 02/05/20 1257    Clinical Impression Statement Patient presents to physical therapy with excellent motivation. Patient progressing with functional ambulation capacity ambulating 189 ft with rest breaks for first time of that direction with significant progression towards functional goal. The patient would benefit from further skilled PT intervention to maximize QOL, safety, and functional mobility    Personal Factors and Comorbidities Age;Comorbidity 3+;Fitness;Past/Current Experience;Sex;Time since onset of injury/illness/exacerbation    Comorbidities HTN, SVT, neurogenic bowel, MS (1995), neuropathy, hx of wrist fx, HPV, Hypercholesteremia, adiposity, osteopenia, spasticity.    Examination-Activity  Limitations Bathing;Bed Mobility;Bend;Carry;Continence;Dressing;Hygiene/Grooming;Stairs;Squat;Reach Overhead;Locomotion Level;Lift;Stand;Transfers    Examination-Participation Restrictions Church;Cleaning;Community Activity;Laundry;Volunteer;Shop;Meal Prep;Yard Work    [employer] officer Evolving/Moderate complexity    Rehab Potential Good    PT Frequency 2x / week    PT Duration 12 weeks    PT Treatment/Interventions ADLs/Self Care Home Management;Aquatic Therapy;Biofeedback;Cryotherapy;Electrical Stimulation;Iontophoresis 38m/ml Dexamethasone;Traction;Moist Heat;Ultrasound;DME Instruction;Gait training;Stair training;Functional mobility training;Therapeutic activities;Therapeutic exercise;Balance training;Neuromuscular re-education;Patient/family education;Orthotic Fit/Training;Wheelchair mobility training;Manual techniques;Compression bandaging;Passive range of motion;Dry needling;Energy conservation;Taping;Splinting;Vestibular    PT Next Visit Plan step negotiation, turning directions to the L and R    PT Home Exercise Plan maintain current HEP from HHPT    Consulted and Agree with Plan of Care Patient           Patient will benefit from skilled therapeutic intervention in order to improve the following deficits and impairments:  Abnormal gait, Decreased activity tolerance, Decreased balance, Decreased coordination, Decreased endurance, Decreased mobility, Decreased range of motion, Decreased strength, Difficulty walking, Impaired perceived functional ability, Impaired sensation, Impaired tone, Impaired UE functional use, Improper body mechanics, Postural dysfunction, Cardiopulmonary status limiting activity, Impaired flexibility, Increased muscle spasms  Visit Diagnosis: Multiple sclerosis exacerbation (HCC)  Muscle weakness (generalized)  Other abnormalities of gait and mobility  Unsteadiness on feet     Problem List Patient Active Problem List   Diagnosis Date  Noted  . Hypoalbuminemia due to protein-calorie malnutrition ([place]   . Neurogenic bowel   . Neurogenic bladder   . Labile blood pressure   . Neuropathic pain   . Abscess of female pelvis   . SVT (supraventricular tachycardia) ([place]   . Radial styloid tenosynovitis 03/12/2018  . Wheelchair confinement 02/27/2018  . Localized osteoporosis with current pathological fracture with routine healing 01/19/2017  . Wrist fracture 01/16/2017  .  Sprain of ankle 03/23/2016  . Closed fracture of lateral malleolus 03/16/2016  . Health care maintenance 01/24/2016  . Blood pressure elevated without history of HTN 10/25/2015  . Essential hypertension 10/25/2015  . Multiple sclerosis ([place]) 10/02/2015  . Chronic left shoulder pain 07/19/2015  . Multiple sclerosis exacerbation ([place]) 07/14/2015  . MS (multiple sclerosis) ([place]) 11/26/2014  . Increased body mass index 11/26/2014  . HPV test positive 11/26/2014  . Status post laparoscopic supracervical hysterectomy 11/26/2014  . Galactorrhea 11/26/2014  . Back ache 05/21/2014  . Adiposity 05/21/2014  . Disordered sleep 05/21/2014  . Muscle spasticity 05/21/2014  . Spasticity 05/21/2014  . Calculus of kidney 12/09/2013  . Renal colic 00/52/5910  . Hypercholesteremia 08/19/2013  . Hereditary and idiopathic neuropathy 08/19/2013  . Hypercholesterolemia without hypertriglyceridemia 08/19/2013  . Bladder infection, chronic 07/25/2012  . Disorder of bladder function 07/25/2012  . Incomplete bladder emptying 07/25/2012  . Microscopic hematuria 07/25/2012  . Right upper quadrant pain 07/25/2012   Janna Arch, PT, DPT   02/05/2020, 1:03 PM  [place] MAIN [facility] SERVICES [address] [place], [place], [address] Phone: [phone]   Fax:  [phone]  Name: Lisa Williams MRN: [id number] Date of Birth: 03-13-1961

## 2020-02-10 ENCOUNTER — Other Ambulatory Visit: Payer: Self-pay

## 2020-02-10 ENCOUNTER — Ambulatory Visit: Payer: PPO | Attending: Neurology

## 2020-02-10 DIAGNOSIS — R2689 Other abnormalities of gait and mobility: Secondary | ICD-10-CM | POA: Insufficient documentation

## 2020-02-10 DIAGNOSIS — G35 Multiple sclerosis: Secondary | ICD-10-CM | POA: Diagnosis not present

## 2020-02-10 DIAGNOSIS — R2681 Unsteadiness on feet: Secondary | ICD-10-CM | POA: Diagnosis not present

## 2020-02-10 DIAGNOSIS — M6281 Muscle weakness (generalized): Secondary | ICD-10-CM | POA: Insufficient documentation

## 2020-02-10 NOTE — Therapy (Signed)
[place] MAIN [facility] SERVICES [address] [place], [place], [address] Phone: [phone]   Fax:  [phone]  Physical Therapy Treatment Physical Therapy Progress Note        Dates of reporting period  01/06/2020   to   02/10/2020  Patient Details  Name: Lisa Williams MRN: [id number] Date of Birth: December 13, 1960 Referring Provider (PT): Gurney Maxin, MD   Encounter Date: 02/10/2020   PT End of Session - 02/10/20 1113    Visit Number 40    Number of Visits 48    Date for PT Re-Evaluation 03/09/20    Authorization Type 10/10 PN 02/10/20; next session 1/10 PN 10/5    PT Start Time 1015    PT Stop Time 1059    PT Time Calculation (min) 44 min    Equipment Utilized During Treatment Gait belt;Other (comment)   bilateral AFOs   Activity Tolerance Patient tolerated treatment well    Behavior During Therapy WFL for tasks assessed/performed           Past Medical History:  Diagnosis Date  . Abdominal pain, right upper quadrant   . Back pain   . Calculus of kidney 12/09/2013  . Chronic back pain    unspecified  . Chronic left shoulder pain 07/19/2015  . Functional disorder of bladder    other  . Galactorrhea 11/26/2014   Chronic   . Hereditary and idiopathic neuropathy 08/19/2013  . HPV test positive   . Hypercholesteremia 08/19/2013  . Incomplete bladder emptying   . Microscopic hematuria   . MS (multiple sclerosis) ([place])   . Muscle spasticity 05/21/2014  . Nonspecific findings on examination of urine    other  . Osteopenia   . Status post laparoscopic supracervical hysterectomy 11/26/2014  . Tobacco user 11/26/2014  . Wrist fracture     Past Surgical History:  Procedure Laterality Date  . bilateral tubal ligation  1996  . BREAST CYST EXCISION Left 2002  . KNEE SURGERY     right  . LAPAROSCOPIC SUPRACERVICAL HYSTERECTOMY  08/05/2013  . ORIF WRIST FRACTURE Left 01/17/2017   Procedure: OPEN REDUCTION INTERNAL FIXATION (ORIF)  WRIST FRACTURE;  Surgeon: Lovell Sheehan, MD;  Location: [facility] ORS;  Service: Orthopedics;  Laterality: Left;  . TUBAL LIGATION Bilateral   . VAGINAL HYSTERECTOMY  03/2006    There were no vitals filed for this visit.   Subjective Assessment - 02/10/20 1016    Subjective Patient notes feeling good today despite rainy weather. Denies falls or LOB since last session    Pertinent History Patient is previous patient of this therapist, was admitted to the hospital on 08/03/19 for MS exacerbation and then went to inpatient rehab on 08/08/19. Patient then received home health therapy and now is returning to outpatient PT. PMh includes HTN, SVT, neurogenic bowel, MS (1995), neuropathy, hx of wrist fx, HPV, Hypercholesteremia, adiposity, osteopenia, spasticity. She wears bilateral AFOs and uses a RW and/or wheelchair for mobility. She drives an adapted car.    Limitations Lifting;Standing;Walking;House hold activities    How long can you sit comfortably? n/a    How long can you stand comfortably? 4 minutes    How long can you walk comfortably? walked 50 ft in inpatient rehab    Patient Stated Goals return to PLOF, increase walking, get into shower, increase mobility    Currently in Pain? No/denies           Goal Performance: See in goal section for  performance.   Treatment:  - Green theraband rows to promote upright posture 10x - Horizontal reach and pass with green physioball. 5x each direction (10x total) - Forward and backward reach and pass with green physioball. 2 sets of 5 passes each direction (20x total)  Ice pack donned during therapeutic rest breaks in order to avoid exacerbation of MS symptoms.   Patient's condition has the potential to improve in response to therapy. Maximum improvement is yet to be obtained. The anticipated improvement is attainable and reasonable in a generally predictable time.  Patient reports being able to do more around the home, completing more tasks with less  difficulty. Patient continues to feel unsure standing in shower. Patient would like to be able to take a step into garage in order to leave home, walk through parking lot into grocery store with minimal difficulty.                       PT Education - 02/10/20 1011    Education provided Yes    Education Details goals, body mechanics, exercise technique    Person(s) Educated Patient    Methods Explanation;Demonstration;Tactile cues;Verbal cues    Comprehension Verbalized understanding;Returned demonstration;Verbal cues required;Tactile cues required            PT Short Term Goals - 02/10/20 1110      PT SHORT TERM GOAL #1   Title Patient will be independent in home exercise program to improve strength/mobility for better functional independence with ADLs.    Baseline 5/19: HEP next session 6/22: HEP compliant 8/10 : HEP progressions compliant 8/31 HEP compliant 10/5: HEP compliant    Time 4    Period Weeks    Status Achieved    Target Date 01/13/20      PT SHORT TERM GOAL #2   Title Patient will perform a sit to stand with SUE support for increased ind. with transfers.    Baseline 5/19: heavy BUE support 6/22: requires one hand on walker one hand on w/c 8/10: able to perform intermittently (some days can some days cannot) 8/31: attempt next session 10/5: BUE reliance, patient unable to complete 4 full stands. Attempt next session    Time 4    Period Weeks    Status Partially Met    Target Date 01/13/20             PT Long Term Goals - 02/10/20 0001      PT LONG TERM GOAL #1   Title Patient will increase FOTO score to equal to or greater than  53/100   to demonstrate statistically significant improvement in mobility and quality of life.     Baseline 8/31: 48.13% 7/27:  48.1% 6/22: 46.99% 5/18: 48/100 10/5: 49/100    Time 12    Period Weeks    Status Partially Met    Target Date 03/09/20      PT LONG TERM GOAL #2   Title Patient (< 80 years old) will  complete five times sit to stand test in < 20 seconds indicating an increased LE strength and improved balance.    Baseline 7/27: 26.8 seconds with one partial stand 6/22: 40. 86 sec; unable to reach full stand last two attempts 5/19: 42 seconds 10/5: BUE reliance, patient unable to complete 4 full stands. Attempt next session    Time 12    Period Weeks    Status Partially Met    Target Date 03/09/20  PT LONG TERM GOAL #3   Title Patient will increase 10 meter walk test to <20 seconds as to improve gait speed for better community ambulation and to reduce fall risk.    Baseline  8/31: 35.1 seconds with RW 7/27: 36 seconds with RW 6/22: 36.1 seconds 5/19: 42 seconds 10/5: 31.9 seconds with RW    Time 12    Period Weeks    Status Partially Met    Target Date 03/09/20      PT LONG TERM GOAL #4   Title Patient will reduce timed up and go to <11 seconds to reduce fall risk and demonstrate improved transfer/gait ability.    Baseline  8/31: 50.8 seconds with RW 7/27: 43.65 6/22: 45.4 seconds 5/19: 47 seconds 10/5: 38.5 seconds with RW    Time 12    Period Weeks    Status On-going    Target Date 03/09/20      PT LONG TERM GOAL #5   Title Patient will ambulate from physical therapy gym to elevator down the hall, ride elevator to first floor, and walk to valet with RW and chair follow for increased capacity for mobility and ind.    Baseline 8/31: able to open door ~50 ft with one rest break 8/10: unable to ambulate to elevator without rest break 9/30: able to ambulate to elevator with 3 rest breaks    Time 12    Period Weeks    Status Partially Met    Target Date 03/09/20                 Plan - 02/10/20 1250    Clinical Impression Statement Patient has made improvements in gait speed as seen by changes in TUG and 10MWT times, but still remains a fall risk indicated by time to complete task. Patient's activity tolerance has increased, seen through shorter therapeutic rest breaks to  cool off. Patient's condition has the potential to improve in response to therapy. Maximum improvement is yet to be obtained. The anticipated improvement is attainable and reasonable in a generally predictable time. The patient would benefit from further skilled PT intervention to maximize QOL, safety, and functional mobility    Personal Factors and Comorbidities Age;Comorbidity 3+;Fitness;Past/Current Experience;Sex;Time since onset of injury/illness/exacerbation    Comorbidities HTN, SVT, neurogenic bowel, MS (1995), neuropathy, hx of wrist fx, HPV, Hypercholesteremia, adiposity, osteopenia, spasticity.    Examination-Activity Limitations Bathing;Bed Mobility;Bend;Carry;Continence;Dressing;Hygiene/Grooming;Stairs;Squat;Reach Overhead;Locomotion Level;Lift;Stand;Transfers    Examination-Participation Restrictions Church;Cleaning;Community Activity;Laundry;Volunteer;Shop;Meal Prep;Yard Work    [employer] Evolving/Moderate complexity    Rehab Potential Good    PT Frequency 2x / week    PT Duration 12 weeks    PT Treatment/Interventions ADLs/Self Care Home Management;Aquatic Therapy;Biofeedback;Cryotherapy;Electrical Stimulation;Iontophoresis 49m/ml Dexamethasone;Traction;Moist Heat;Ultrasound;DME Instruction;Gait training;Stair training;Functional mobility training;Therapeutic activities;Therapeutic exercise;Balance training;Neuromuscular re-education;Patient/family education;Orthotic Fit/Training;Wheelchair mobility training;Manual techniques;Compression bandaging;Passive range of motion;Dry needling;Energy conservation;Taping;Splinting;Vestibular    PT Next Visit Plan step negotiation, turning directions to the L and R    PT Home Exercise Plan maintain current HEP from HH[facility]    Consulted and Agree with Plan of Care Patient           Patient will benefit from skilled therapeutic intervention in order to improve the following deficits and impairments:  Abnormal gait, Decreased  activity tolerance, Decreased balance, Decreased coordination, Decreased endurance, Decreased mobility, Decreased range of motion, Decreased strength, Difficulty walking, Impaired perceived functional ability, Impaired sensation, Impaired tone, Impaired UE functional use, Improper body mechanics, Postural dysfunction, Cardiopulmonary status limiting activity, Impaired flexibility, Increased  muscle spasms  Visit Diagnosis: Multiple sclerosis exacerbation ([facility])  Muscle weakness (generalized)  Other abnormalities of gait and mobility  Unsteadiness on feet     Problem List Patient Active Problem List   Diagnosis Date Noted  . Hypoalbuminemia due to protein-calorie malnutrition ([place])   . Neurogenic bowel   . Neurogenic bladder   . Labile blood pressure   . Neuropathic pain   . Abscess of female pelvis   . SVT (supraventricular tachycardia) ([place])   . Radial styloid tenosynovitis 03/12/2018  . Wheelchair confinement 02/27/2018  . Localized osteoporosis with current pathological fracture with routine healing 01/19/2017  . Wrist fracture 01/16/2017  . Sprain of ankle 03/23/2016  . Closed fracture of lateral malleolus 03/16/2016  . Health care maintenance 01/24/2016  . Blood pressure elevated without history of HTN 10/25/2015  . Essential hypertension 10/25/2015  . Multiple sclerosis ([place]) 10/02/2015  . Chronic left shoulder pain 07/19/2015  . Multiple sclerosis exacerbation ([place]) 07/14/2015  . MS (multiple sclerosis) ([place]) 11/26/2014  . Increased body mass index 11/26/2014  . HPV test positive 11/26/2014  . Status post laparoscopic supracervical hysterectomy 11/26/2014  . Galactorrhea 11/26/2014  . Back ache 05/21/2014  . Adiposity 05/21/2014  . Disordered sleep 05/21/2014  . Muscle spasticity 05/21/2014  . Spasticity 05/21/2014  . Calculus of kidney 12/09/2013  . Renal colic 16/11/3708  . Hypercholesteremia 08/19/2013  . Hereditary and idiopathic neuropathy 08/19/2013  .  Hypercholesterolemia without hypertriglyceridemia 08/19/2013  . Bladder infection, chronic 07/25/2012  . Disorder of bladder function 07/25/2012  . Incomplete bladder emptying 07/25/2012  . Microscopic hematuria 07/25/2012  . Right upper quadrant pain 07/25/2012   Tonny Bollman, SPT  This entire session was performed under direct supervision and direction of a licensed therapist/therapist assistant . I have personally read, edited and approve of the note as written.  Janna Arch, PT, DPT   02/10/2020, 4:11 PM  [place] MAIN [facility] SERVICES [address] [place], [place], [address] Phone: [phone]   Fax:  [phone]  Name: Joretta Eads Kage MRN: [id number] Date of Birth: 19-Sep-1960

## 2020-02-12 ENCOUNTER — Ambulatory Visit: Payer: PPO

## 2020-02-12 ENCOUNTER — Other Ambulatory Visit: Payer: Self-pay

## 2020-02-12 DIAGNOSIS — E038 Other specified hypothyroidism: Secondary | ICD-10-CM | POA: Diagnosis not present

## 2020-02-12 DIAGNOSIS — Z0000 Encounter for general adult medical examination without abnormal findings: Secondary | ICD-10-CM | POA: Diagnosis not present

## 2020-02-12 DIAGNOSIS — G35 Multiple sclerosis: Secondary | ICD-10-CM | POA: Diagnosis not present

## 2020-02-12 DIAGNOSIS — E669 Obesity, unspecified: Secondary | ICD-10-CM | POA: Diagnosis not present

## 2020-02-12 DIAGNOSIS — R2681 Unsteadiness on feet: Secondary | ICD-10-CM

## 2020-02-12 DIAGNOSIS — M6281 Muscle weakness (generalized): Secondary | ICD-10-CM

## 2020-02-12 DIAGNOSIS — R2689 Other abnormalities of gait and mobility: Secondary | ICD-10-CM

## 2020-02-12 DIAGNOSIS — E559 Vitamin D deficiency, unspecified: Secondary | ICD-10-CM | POA: Diagnosis not present

## 2020-02-12 DIAGNOSIS — I10 Essential (primary) hypertension: Secondary | ICD-10-CM | POA: Diagnosis not present

## 2020-02-12 DIAGNOSIS — Z993 Dependence on wheelchair: Secondary | ICD-10-CM | POA: Diagnosis not present

## 2020-02-12 NOTE — Therapy (Signed)
[place] MAIN [facility] SERVICES [address] [place], [place], [address] Phone: [phone]   Fax:  [phone]  Physical Therapy Treatment  Patient Details  Name: Lisa Williams MRN: [id number] Date of Birth: Jun 18, 1960 Referring Provider (PT): Gurney Maxin, MD   Encounter Date: 02/12/2020   PT End of Session - 02/12/20 1050    Visit Number 41    Number of Visits 48    Date for PT Re-Evaluation 03/09/20    Authorization Type 1/10 PN 02/10/20    PT Start Time 1015    PT Stop Time 1045    PT Time Calculation (min) 30 min    Equipment Utilized During Treatment Gait belt;Other (comment)   bilateral AFOs   Activity Tolerance Patient tolerated treatment well    Behavior During Therapy WFL for tasks assessed/performed           Past Medical History:  Diagnosis Date  . Abdominal pain, right upper quadrant   . Back pain   . Calculus of kidney 12/09/2013  . Chronic back pain    unspecified  . Chronic left shoulder pain 07/19/2015  . Functional disorder of bladder    other  . Galactorrhea 11/26/2014   Chronic   . Hereditary and idiopathic neuropathy 08/19/2013  . HPV test positive   . Hypercholesteremia 08/19/2013  . Incomplete bladder emptying   . Microscopic hematuria   . MS (multiple sclerosis) ([place])   . Muscle spasticity 05/21/2014  . Nonspecific findings on examination of urine    other  . Osteopenia   . Status post laparoscopic supracervical hysterectomy 11/26/2014  . Tobacco user 11/26/2014  . Wrist fracture     Past Surgical History:  Procedure Laterality Date  . bilateral tubal ligation  1996  . BREAST CYST EXCISION Left 2002  . KNEE SURGERY     right  . LAPAROSCOPIC SUPRACERVICAL HYSTERECTOMY  08/05/2013  . ORIF WRIST FRACTURE Left 01/17/2017   Procedure: OPEN REDUCTION INTERNAL FIXATION (ORIF) WRIST FRACTURE;  Surgeon: Lovell Sheehan, MD;  Location: [facility] ORS;  Service: Orthopedics;  Laterality: Left;  . TUBAL  LIGATION Bilateral   . VAGINAL HYSTERECTOMY  03/2006    There were no vitals filed for this visit.   Subjective Assessment - 02/12/20 1049    Subjective Patient denies falls or LOB since last session. Notes that lower back has been hurting for the past 2 days but does not know the cause. Patient has to leave session early due to doctor's appt.    Pertinent History Patient is previous patient of this therapist, was admitted to the hospital on 08/03/19 for MS exacerbation and then went to inpatient rehab on 08/08/19. Patient then received home health therapy and now is returning to outpatient PT. PMh includes HTN, SVT, neurogenic bowel, MS (1995), neuropathy, hx of wrist fx, HPV, Hypercholesteremia, adiposity, osteopenia, spasticity. She wears bilateral AFOs and uses a RW and/or wheelchair for mobility. She drives an adapted car.    Limitations Lifting;Standing;Walking;House hold activities    How long can you sit comfortably? n/a    How long can you stand comfortably? 4 minutes    How long can you walk comfortably? walked 50 ft in inpatient rehab    Patient Stated Goals return to PLOF, increase walking, get into shower, increase mobility    Currently in Pain? Yes    Pain Score 2     Pain Location Back    Pain Orientation Lower;Right;Left    Pain Descriptors /  Indicators Sore;Aching    Pain Onset In the past 7 days              Treatment:  Seated: Inverted cone lateral abduction passes without back support. 12x 2 sets. PT cues for core activation throughout task.  Forward trunk Ball roll outs with silver physioball. 12x with 5 second holds  Overhead arcs with green physioball 10 x 2 sets.  Horizontal reach and pass with green physioball. 8x each direction (16x total)   STM to bilateral lumbar paraspinals and quadratus lumborum for pain reduction with implementation of effleurage and ptrissage x 11 minutes with patient in w/c to decrease pain with transitions  Ice donned  throughout session to minimize exacerbation of MS symptoms.   Session duration limited due to having to leave early for physician appointment. Patient reports reduction of pain by end of session                      PT Education - 02/12/20 1003    Education provided Yes    Education Details exercise technique, body mechanics, breathing    Person(s) Educated Patient    Methods Explanation;Demonstration;Tactile cues;Verbal cues    Comprehension Verbalized understanding;Tactile cues required;Returned demonstration;Verbal cues required            PT Short Term Goals - 02/10/20 1110      PT SHORT TERM GOAL #1   Title Patient will be independent in home exercise program to improve strength/mobility for better functional independence with ADLs.    Baseline 5/19: HEP next session 6/22: HEP compliant 8/10 : HEP progressions compliant 8/31 HEP compliant 10/5: HEP compliant    Time 4    Period Weeks    Status Achieved    Target Date 01/13/20      PT SHORT TERM GOAL #2   Title Patient will perform a sit to stand with SUE support for increased ind. with transfers.    Baseline 5/19: heavy BUE support 6/22: requires one hand on walker one hand on w/c 8/10: able to perform intermittently (some days can some days cannot) 8/31: attempt next session 10/5: BUE reliance, patient unable to complete 4 full stands. Attempt next session    Time 4    Period Weeks    Status Partially Met    Target Date 01/13/20             PT Long Term Goals - 02/10/20 0001      PT LONG TERM GOAL #1   Title Patient will increase FOTO score to equal to or greater than  53/100   to demonstrate statistically significant improvement in mobility and quality of life.     Baseline 8/31: 48.13% 7/27:  48.1% 6/22: 46.99% 5/18: 48/100 10/5: 49/100    Time 12    Period Weeks    Status Partially Met    Target Date 03/09/20      PT LONG TERM GOAL #2   Title Patient (< 26 years old) will complete five times  sit to stand test in < 20 seconds indicating an increased LE strength and improved balance.    Baseline 7/27: 26.8 seconds with one partial stand 6/22: 40. 86 sec; unable to reach full stand last two attempts 5/19: 42 seconds 10/5: BUE reliance, patient unable to complete 4 full stands. Attempt next session    Time 12    Period Weeks    Status Partially Met    Target Date 03/09/20  PT LONG TERM GOAL #3   Title Patient will increase 10 meter walk test to <20 seconds as to improve gait speed for better community ambulation and to reduce fall risk.    Baseline  8/31: 35.1 seconds with RW 7/27: 36 seconds with RW 6/22: 36.1 seconds 5/19: 42 seconds 10/5: 31.9 seconds with RW    Time 12    Period Weeks    Status Partially Met    Target Date 03/09/20      PT LONG TERM GOAL #4   Title Patient will reduce timed up and go to <11 seconds to reduce fall risk and demonstrate improved transfer/gait ability.    Baseline  8/31: 50.8 seconds with RW 7/27: 43.65 6/22: 45.4 seconds 5/19: 47 seconds 10/5: 38.5 seconds with RW    Time 12    Period Weeks    Status On-going    Target Date 03/09/20      PT LONG TERM GOAL #5   Title Patient will ambulate from physical therapy gym to elevator down the hall, ride elevator to first floor, and walk to valet with RW and chair follow for increased capacity for mobility and ind.    Baseline 8/31: able to open door ~50 ft with one rest break 8/10: unable to ambulate to elevator without rest break 9/30: able to ambulate to elevator with 3 rest breaks    Time 12    Period Weeks    Status Partially Met    Target Date 03/09/20                 Plan - 02/12/20 1056    Clinical Impression Statement Patient limited by back pain this session. Interventions targeted releasing tension and pain reduction. Patient able to perform seated exercise with no bouts of back spasm or provocation of pain. The patient would benefit from further skilled PT intervention to  maximize QOL, safety, and functional mobility    Personal Factors and Comorbidities Age;Comorbidity 3+;Fitness;Past/Current Experience;Sex;Time since onset of injury/illness/exacerbation    Comorbidities HTN, SVT, neurogenic bowel, MS (1995), neuropathy, hx of wrist fx, HPV, Hypercholesteremia, adiposity, osteopenia, spasticity.    Examination-Activity Limitations Bathing;Bed Mobility;Bend;Carry;Continence;Dressing;Hygiene/Grooming;Stairs;Squat;Reach Overhead;Locomotion Level;Lift;Stand;Transfers    Examination-Participation Restrictions Church;Cleaning;Community Activity;Laundry;Volunteer;Shop;Meal Prep;Yard Work    [employer]chant [employer] officer Evolving/Moderate complexity    Rehab Potential Good    PT Frequency 2x / week    PT Duration 12 weeks    PT Treatment/Interventions ADLs/Self Care Home Management;Aquatic Therapy;Biofeedback;Cryotherapy;Electrical Stimulation;Iontophoresis 34m/ml Dexamethasone;Traction;Moist Heat;Ultrasound;DME Instruction;Gait training;Stair training;Functional mobility training;Therapeutic activities;Therapeutic exercise;Balance training;Neuromuscular re-education;Patient/family education;Orthotic Fit/Training;Wheelchair mobility training;Manual techniques;Compression bandaging;Passive range of motion;Dry needling;Energy conservation;Taping;Splinting;Vestibular    PT Next Visit Plan step negotiation, turning directions to the L and R    PT Home Exercise Plan maintain current HEP from HHPT    Consulted and Agree with Plan of Care Patient           Patient will benefit from skilled therapeutic intervention in order to improve the following deficits and impairments:  Abnormal gait, Decreased activity tolerance, Decreased balance, Decreased coordination, Decreased endurance, Decreased mobility, Decreased range of motion, Decreased strength, Difficulty walking, Impaired perceived functional ability, Impaired sensation, Impaired tone, Impaired UE functional use, Improper  body mechanics, Postural dysfunction, Cardiopulmonary status limiting activity, Impaired flexibility, Increased muscle spasms  Visit Diagnosis: Multiple sclerosis exacerbation (HCC)  Muscle weakness (generalized)  Other abnormalities of gait and mobility  Unsteadiness on feet     Problem List Patient Active Problem List   Diagnosis Date Noted  . Hypoalbuminemia  due to protein-calorie malnutrition ([place])   . Neurogenic bowel   . Neurogenic bladder   . Labile blood pressure   . Neuropathic pain   . Abscess of female pelvis   . SVT (supraventricular tachycardia) ([place])   . Radial styloid tenosynovitis 03/12/2018  . Wheelchair confinement 02/27/2018  . Localized osteoporosis with current pathological fracture with routine healing 01/19/2017  . Wrist fracture 01/16/2017  . Sprain of ankle 03/23/2016  . Closed fracture of lateral malleolus 03/16/2016  . Health care maintenance 01/24/2016  . Blood pressure elevated without history of HTN 10/25/2015  . Essential hypertension 10/25/2015  . Multiple sclerosis ([place]) 10/02/2015  . Chronic left shoulder pain 07/19/2015  . Multiple sclerosis exacerbation ([place]) 07/14/2015  . MS (multiple sclerosis) ([place]) 11/26/2014  . Increased body mass index 11/26/2014  . HPV test positive 11/26/2014  . Status post laparoscopic supracervical hysterectomy 11/26/2014  . Galactorrhea 11/26/2014  . Back ache 05/21/2014  . Adiposity 05/21/2014  . Disordered sleep 05/21/2014  . Muscle spasticity 05/21/2014  . Spasticity 05/21/2014  . Calculus of kidney 12/09/2013  . Renal colic 99/37/1696  . Hypercholesteremia 08/19/2013  . Hereditary and idiopathic neuropathy 08/19/2013  . Hypercholesterolemia without hypertriglyceridemia 08/19/2013  . Bladder infection, chronic 07/25/2012  . Disorder of bladder function 07/25/2012  . Incomplete bladder emptying 07/25/2012  . Microscopic hematuria 07/25/2012  . Right upper quadrant pain 07/25/2012   Tonny Bollman, SPT  This entire session was performed under direct supervision and direction of a licensed therapist/therapist assistant . I have personally read, edited and approve of the note as written.  Janna Arch, PT, DPT   02/12/2020, 10:57 AM  [place] MAIN [facility] SERVICES [address] [place], [place], [address] Phone: [phone]   Fax:  [phone]  Name: Lisa Williams MRN: [id number] Date of Birth: Oct 12, 1960

## 2020-02-17 ENCOUNTER — Other Ambulatory Visit: Payer: Self-pay

## 2020-02-17 ENCOUNTER — Ambulatory Visit: Payer: PPO

## 2020-02-17 DIAGNOSIS — G35 Multiple sclerosis: Secondary | ICD-10-CM

## 2020-02-17 DIAGNOSIS — R2681 Unsteadiness on feet: Secondary | ICD-10-CM

## 2020-02-17 DIAGNOSIS — M6281 Muscle weakness (generalized): Secondary | ICD-10-CM

## 2020-02-17 DIAGNOSIS — R2689 Other abnormalities of gait and mobility: Secondary | ICD-10-CM

## 2020-02-17 NOTE — Therapy (Signed)
[place] MAIN [facility] SERVICES [address] [place], [place], [address] Phone: [phone]   Fax:  [phone]  Physical Therapy Treatment  Patient Details  Name: Lisa Williams MRN: [id number] Date of Birth: 04-13-1961 Referring Provider (PT): Gurney Maxin, MD   Encounter Date: 02/17/2020   PT End of Session - 02/17/20 1106    Visit Number 42    Number of Visits 48    Date for PT Re-Evaluation 03/09/20    Authorization Type 1/10 PN 02/10/20    PT Start Time 1015    PT Stop Time 1059    PT Time Calculation (min) 44 min    Equipment Utilized During Treatment Gait belt;Other (comment)   bilateral AFOs   Activity Tolerance Patient tolerated treatment well    Behavior During Therapy WFL for tasks assessed/performed           Past Medical History:  Diagnosis Date  . Abdominal pain, right upper quadrant   . Back pain   . Calculus of kidney 12/09/2013  . Chronic back pain    unspecified  . Chronic left shoulder pain 07/19/2015  . Functional disorder of bladder    other  . Galactorrhea 11/26/2014   Chronic   . Hereditary and idiopathic neuropathy 08/19/2013  . HPV test positive   . Hypercholesteremia 08/19/2013  . Incomplete bladder emptying   . Microscopic hematuria   . MS (multiple sclerosis) ([place])   . Muscle spasticity 05/21/2014  . Nonspecific findings on examination of urine    other  . Osteopenia   . Status post laparoscopic supracervical hysterectomy 11/26/2014  . Tobacco user 11/26/2014  . Wrist fracture     Past Surgical History:  Procedure Laterality Date  . bilateral tubal ligation  1996  . BREAST CYST EXCISION Left 2002  . KNEE SURGERY     right  . LAPAROSCOPIC SUPRACERVICAL HYSTERECTOMY  08/05/2013  . ORIF WRIST FRACTURE Left 01/17/2017   Procedure: OPEN REDUCTION INTERNAL FIXATION (ORIF) WRIST FRACTURE;  Surgeon: Lovell Sheehan, MD;  Location: [facility] ORS;  Service: Orthopedics;  Laterality: Left;  . TUBAL  LIGATION Bilateral   . VAGINAL HYSTERECTOMY  03/2006    There were no vitals filed for this visit.   Subjective Assessment - 02/17/20 1105    Subjective Patient states having a very good day today. Denies falls or LOB since last session. Patient has been practicing standing ADLs with bed behind for safety.    Pertinent History Patient is previous patient of this therapist, was admitted to the hospital on 08/03/19 for MS exacerbation and then went to inpatient rehab on 08/08/19. Patient then received home health therapy and now is returning to outpatient PT. PMh includes HTN, SVT, neurogenic bowel, MS (1995), neuropathy, hx of wrist fx, HPV, Hypercholesteremia, adiposity, osteopenia, spasticity. She wears bilateral AFOs and uses a RW and/or wheelchair for mobility. She drives an adapted car.    Limitations Lifting;Standing;Walking;House hold activities    How long can you sit comfortably? n/a    How long can you stand comfortably? 4 minutes    How long can you walk comfortably? walked 50 ft in inpatient rehab    Patient Stated Goals return to PLOF, increase walking, get into shower, increase mobility    Currently in Pain? No/denies    Pain Onset In the past 7 days            Treatment:  Seated: Alternating uppercuts to PT hands. PT brings targets further away  to facilitate forward lean and exaggerated reaches. 30 sec x 2 bouts.  Ball squeezes with green small ball. 12x 3 second holds  LAQ beginning with knees flexed 45 degrees. PT assists with fingertips under toes. 10x each leg  Core activation with green physioball. 10 x 2 sets with 3 second holds.  Alternating twist hand pattern with green physioball for core and shoulder activation 10x each direction (20x total)  Standing in // Bars: Chest passes with rainbow ball. Patient requires SUE support on bar throughout task. Patient attempts to stand without UE support but demonstrates lateral displacement. Performed 8x.  Alternating  cross jabs to challenge upright tolerance and dynamic standing balance with perturbations. 10 x 2 sets. Patient able to be without UE support for ~2 seconds before requiring SUE support to stabilize.   Ice donned throughout session to minimize exacerbation of MS symptoms.                     PT Education - 02/17/20 1014    Education provided Yes    Education Details pacing, exercise technique, body mechanics    Person(s) Educated Patient    Methods Explanation;Demonstration;Tactile cues;Verbal cues    Comprehension Verbalized understanding;Tactile cues required;Returned demonstration;Verbal cues required            PT Short Term Goals - 02/10/20 1110      PT SHORT TERM GOAL #1   Title Patient will be independent in home exercise program to improve strength/mobility for better functional independence with ADLs.    Baseline 5/19: HEP next session 6/22: HEP compliant 8/10 : HEP progressions compliant 8/31 HEP compliant 10/5: HEP compliant    Time 4    Period Weeks    Status Achieved    Target Date 01/13/20      PT SHORT TERM GOAL #2   Title Patient will perform a sit to stand with SUE support for increased ind. with transfers.    Baseline 5/19: heavy BUE support 6/22: requires one hand on walker one hand on w/c 8/10: able to perform intermittently (some days can some days cannot) 8/31: attempt next session 10/5: BUE reliance, patient unable to complete 4 full stands. Attempt next session    Time 4    Period Weeks    Status Partially Met    Target Date 01/13/20             PT Long Term Goals - 02/10/20 0001      PT LONG TERM GOAL #1   Title Patient will increase FOTO score to equal to or greater than  53/100   to demonstrate statistically significant improvement in mobility and quality of life.     Baseline 8/31: 48.13% 7/27:  48.1% 6/22: 46.99% 5/18: 48/100 10/5: 49/100    Time 12    Period Weeks    Status Partially Met    Target Date 03/09/20      PT  LONG TERM GOAL #2   Title Patient (< 28 years old) will complete five times sit to stand test in < 20 seconds indicating an increased LE strength and improved balance.    Baseline 7/27: 26.8 seconds with one partial stand 6/22: 40. 86 sec; unable to reach full stand last two attempts 5/19: 42 seconds 10/5: BUE reliance, patient unable to complete 4 full stands. Attempt next session    Time 12    Period Weeks    Status Partially Met    Target Date 03/09/20  PT LONG TERM GOAL #3   Title Patient will increase 10 meter walk test to <20 seconds as to improve gait speed for better community ambulation and to reduce fall risk.    Baseline  8/31: 35.1 seconds with RW 7/27: 36 seconds with RW 6/22: 36.1 seconds 5/19: 42 seconds 10/5: 31.9 seconds with RW    Time 12    Period Weeks    Status Partially Met    Target Date 03/09/20      PT LONG TERM GOAL #4   Title Patient will reduce timed up and go to <11 seconds to reduce fall risk and demonstrate improved transfer/gait ability.    Baseline  8/31: 50.8 seconds with RW 7/27: 43.65 6/22: 45.4 seconds 5/19: 47 seconds 10/5: 38.5 seconds with RW    Time 12    Period Weeks    Status On-going    Target Date 03/09/20      PT LONG TERM GOAL #5   Title Patient will ambulate from physical therapy gym to elevator down the hall, ride elevator to first floor, and walk to valet with RW and chair follow for increased capacity for mobility and ind.    Baseline 8/31: able to open door ~50 ft with one rest break 8/10: unable to ambulate to elevator without rest break 9/30: able to ambulate to elevator with 3 rest breaks    Time 12    Period Weeks    Status Partially Met    Target Date 03/09/20                 Plan - 02/17/20 1120    Clinical Impression Statement Patient demonstrates improved balance and upright tolerance this session. Patient able to stand without UE support during dynamic balance interventions. Patient has excellent motivation  this date. The patient would benefit from further skilled PT intervention to maximize QOL, safety, and functional mobility.    Personal Factors and Comorbidities Age;Comorbidity 3+;Fitness;Past/Current Experience;Sex;Time since onset of injury/illness/exacerbation    Comorbidities HTN, SVT, neurogenic bowel, MS (1995), neuropathy, hx of wrist fx, HPV, Hypercholesteremia, adiposity, osteopenia, spasticity.    Examination-Activity Limitations Bathing;Bed Mobility;Bend;Carry;Continence;Dressing;Hygiene/Grooming;Stairs;Squat;Reach Overhead;Locomotion Level;Lift;Stand;Transfers    Examination-Participation Restrictions Church;Cleaning;Community Activity;Laundry;Volunteer;Shop;Meal Prep;Yard Work    [employer]chant [employer] officer Evolving/Moderate complexity    Rehab Potential Good    PT Frequency 2x / week    PT Duration 12 weeks    PT Treatment/Interventions ADLs/Self Care Home Management;Aquatic Therapy;Biofeedback;Cryotherapy;Electrical Stimulation;Iontophoresis 46m/ml Dexamethasone;Traction;Moist Heat;Ultrasound;DME Instruction;Gait training;Stair training;Functional mobility training;Therapeutic activities;Therapeutic exercise;Balance training;Neuromuscular re-education;Patient/family education;Orthotic Fit/Training;Wheelchair mobility training;Manual techniques;Compression bandaging;Passive range of motion;Dry needling;Energy conservation;Taping;Splinting;Vestibular    PT Next Visit Plan step negotiation, turning directions to the L and R    PT Home Exercise Plan maintain current HEP from HHPT    Consulted and Agree with Plan of Care Patient           Patient will benefit from skilled therapeutic intervention in order to improve the following deficits and impairments:  Abnormal gait, Decreased activity tolerance, Decreased balance, Decreased coordination, Decreased endurance, Decreased mobility, Decreased range of motion, Decreased strength, Difficulty walking, Impaired perceived functional  ability, Impaired sensation, Impaired tone, Impaired UE functional use, Improper body mechanics, Postural dysfunction, Cardiopulmonary status limiting activity, Impaired flexibility, Increased muscle spasms  Visit Diagnosis: Multiple sclerosis exacerbation (HCC)  Muscle weakness (generalized)  Other abnormalities of gait and mobility  Unsteadiness on feet     Problem List Patient Active Problem List   Diagnosis Date Noted  . Hypoalbuminemia due to protein-calorie malnutrition (  HCC)   . Neurogenic bowel   . Neurogenic bladder   . Labile blood pressure   . Neuropathic pain   . Abscess of female pelvis   . SVT (supraventricular tachycardia) ([place])   . Radial styloid tenosynovitis 03/12/2018  . Wheelchair confinement 02/27/2018  . Localized osteoporosis with current pathological fracture with routine healing 01/19/2017  . Wrist fracture 01/16/2017  . Sprain of ankle 03/23/2016  . Closed fracture of lateral malleolus 03/16/2016  . Health care maintenance 01/24/2016  . Blood pressure elevated without history of HTN 10/25/2015  . Essential hypertension 10/25/2015  . Multiple sclerosis ([place]) 10/02/2015  . Chronic left shoulder pain 07/19/2015  . Multiple sclerosis exacerbation ([place]) 07/14/2015  . MS (multiple sclerosis) ([place]) 11/26/2014  . Increased body mass index 11/26/2014  . HPV test positive 11/26/2014  . Status post laparoscopic supracervical hysterectomy 11/26/2014  . Galactorrhea 11/26/2014  . Back ache 05/21/2014  . Adiposity 05/21/2014  . Disordered sleep 05/21/2014  . Muscle spasticity 05/21/2014  . Spasticity 05/21/2014  . Calculus of kidney 12/09/2013  . Renal colic 73/42/8768  . Hypercholesteremia 08/19/2013  . Hereditary and idiopathic neuropathy 08/19/2013  . Hypercholesterolemia without hypertriglyceridemia 08/19/2013  . Bladder infection, chronic 07/25/2012  . Disorder of bladder function 07/25/2012  . Incomplete bladder emptying 07/25/2012  .  Microscopic hematuria 07/25/2012  . Right upper quadrant pain 07/25/2012   Tonny Bollman, SPT  This entire session was performed under direct supervision and direction of a licensed therapist/therapist assistant . I have personally read, edited and approve of the note as written.  Janna Arch, PT, DPT   02/17/2020, 1:38 PM  [place] MAIN [facility] SERVICES [address] [place], [place], [address] Phone: [phone]   Fax:  [phone]  Name: Lisa Williams MRN: [id number] Date of Birth: November 29, 1960

## 2020-02-19 ENCOUNTER — Other Ambulatory Visit: Payer: Self-pay

## 2020-02-19 ENCOUNTER — Ambulatory Visit: Payer: PPO

## 2020-02-19 DIAGNOSIS — M6281 Muscle weakness (generalized): Secondary | ICD-10-CM

## 2020-02-19 DIAGNOSIS — R2681 Unsteadiness on feet: Secondary | ICD-10-CM

## 2020-02-19 DIAGNOSIS — G35 Multiple sclerosis: Secondary | ICD-10-CM | POA: Diagnosis not present

## 2020-02-19 DIAGNOSIS — G35D Multiple sclerosis, unspecified: Secondary | ICD-10-CM

## 2020-02-19 DIAGNOSIS — R2689 Other abnormalities of gait and mobility: Secondary | ICD-10-CM

## 2020-02-19 NOTE — Therapy (Signed)
[place] MAIN [facility] SERVICES [address] [place], [place], [address] Phone: [phone]   Fax:  [phone]  Physical Therapy Treatment  Patient Details  Name: Lisa Williams MRN: [id number] Date of Birth: Apr 11, 1961 Referring Provider (PT): Gurney Maxin, MD   Encounter Date: 02/19/2020   PT End of Session - 02/19/20 1142    Visit Number 43    Number of Visits 48    Date for PT Re-Evaluation 03/09/20    Authorization Type 3/10 PN 02/10/20    PT Start Time 1013    PT Stop Time 1057    PT Time Calculation (min) 44 min    Equipment Utilized During Treatment Gait belt;Other (comment)   bilateral AFOs   Activity Tolerance Patient tolerated treatment well    Behavior During Therapy WFL for tasks assessed/performed           Past Medical History:  Diagnosis Date  . Abdominal pain, right upper quadrant   . Back pain   . Calculus of kidney 12/09/2013  . Chronic back pain    unspecified  . Chronic left shoulder pain 07/19/2015  . Functional disorder of bladder    other  . Galactorrhea 11/26/2014   Chronic   . Hereditary and idiopathic neuropathy 08/19/2013  . HPV test positive   . Hypercholesteremia 08/19/2013  . Incomplete bladder emptying   . Microscopic hematuria   . MS (multiple sclerosis) ([place])   . Muscle spasticity 05/21/2014  . Nonspecific findings on examination of urine    other  . Osteopenia   . Status post laparoscopic supracervical hysterectomy 11/26/2014  . Tobacco user 11/26/2014  . Wrist fracture     Past Surgical History:  Procedure Laterality Date  . bilateral tubal ligation  1996  . BREAST CYST EXCISION Left 2002  . KNEE SURGERY     right  . LAPAROSCOPIC SUPRACERVICAL HYSTERECTOMY  08/05/2013  . ORIF WRIST FRACTURE Left 01/17/2017   Procedure: OPEN REDUCTION INTERNAL FIXATION (ORIF) WRIST FRACTURE;  Surgeon: Lovell Sheehan, MD;  Location: [facility] ORS;  Service: Orthopedics;  Laterality: Left;  . TUBAL  LIGATION Bilateral   . VAGINAL HYSTERECTOMY  03/2006    There were no vitals filed for this visit.   Subjective Assessment - 02/19/20 1014    Subjective Patient is having a good day today. Denies falls or LOB since last session.    Pertinent History Patient is previous patient of this therapist, was admitted to the hospital on 08/03/19 for MS exacerbation and then went to inpatient rehab on 08/08/19. Patient then received home health therapy and now is returning to outpatient PT. PMh includes HTN, SVT, neurogenic bowel, MS (1995), neuropathy, hx of wrist fx, HPV, Hypercholesteremia, adiposity, osteopenia, spasticity. She wears bilateral AFOs and uses a RW and/or wheelchair for mobility. She drives an adapted car.    Limitations Lifting;Standing;Walking;House hold activities    How long can you sit comfortably? n/a    How long can you stand comfortably? 4 minutes    How long can you walk comfortably? walked 50 ft in inpatient rehab    Patient Stated Goals return to PLOF, increase walking, get into shower, increase mobility    Currently in Pain? No/denies    Pain Onset In the past 7 days           Treatment: 5xSTS: 38 seconds to complete 4 full stands. Reliance on UE.  Gait: Patient ambulates ~9f with RW. CGA and wheelchair follow for safety. Patient  able to navigate opening doors, turns, and thresholds independently but with inc time to perform. Patient demonstrates inc reliance on BUE by end of gait trial.  Seated: Core activation with alternating arm raises with green physioball. 10 raises each arm (20x total)  Horizontal reach and pass with green physioball. 7x each direction (14x total)  Forward leans in chair with blue medicine ball 10x  Overhead press with blue medicine ball. PT cues for core activation during task. 10x  Rows with blue medicine ball. PT cues for core activation during task. 10x  Hamstring lengthening 60 seconds x 2 trials with overpressure each  LE  Isometric hip flexion against PT 10 x 2 seconds holds each leg  Isometric hip abduction against PT 10 x 2 seconds holds each leg  Isometric hip adduction against PT 10 x 2 second holds each leg   Ice pack donned throughout session to minimize exacerbation of MS symptoms in order to perform interventions.   Pt educated throughout session about proper posture and technique with exercises. Improved exercise technique, movement at target joints, use of target muscles after min to mod verbal, visual, tactile cues.                       PT Education - 02/19/20 1142    Education provided Yes    Education Details exercise technique, body mechanics    Person(s) Educated Patient    Methods Explanation;Demonstration;Tactile cues;Verbal cues    Comprehension Tactile cues required;Verbalized understanding;Returned demonstration;Verbal cues required            PT Short Term Goals - 02/10/20 1110      PT SHORT TERM GOAL #1   Title Patient will be independent in home exercise program to improve strength/mobility for better functional independence with ADLs.    Baseline 5/19: HEP next session 6/22: HEP compliant 8/10 : HEP progressions compliant 8/31 HEP compliant 10/5: HEP compliant    Time 4    Period Weeks    Status Achieved    Target Date 01/13/20      PT SHORT TERM GOAL #2   Title Patient will perform a sit to stand with SUE support for increased ind. with transfers.    Baseline 5/19: heavy BUE support 6/22: requires one hand on walker one hand on w/c 8/10: able to perform intermittently (some days can some days cannot) 8/31: attempt next session 10/5: BUE reliance, patient unable to complete 4 full stands. Attempt next session    Time 4    Period Weeks    Status Partially Met    Target Date 01/13/20             PT Long Term Goals - 02/10/20 0001      PT LONG TERM GOAL #1   Title Patient will increase FOTO score to equal to or greater than  53/100   to  demonstrate statistically significant improvement in mobility and quality of life.     Baseline 8/31: 48.13% 7/27:  48.1% 6/22: 46.99% 5/18: 48/100 10/5: 49/100    Time 12    Period Weeks    Status Partially Met    Target Date 03/09/20      PT LONG TERM GOAL #2   Title Patient (< 36 years old) will complete five times sit to stand test in < 20 seconds indicating an increased LE strength and improved balance.    Baseline 7/27: 26.8 seconds with one partial stand 6/22: 40. 86 sec;  unable to reach full stand last two attempts 5/19: 42 seconds 10/5: BUE reliance, patient unable to complete 4 full stands. Attempt next session    Time 12    Period Weeks    Status Partially Met    Target Date 03/09/20      PT LONG TERM GOAL #3   Title Patient will increase 10 meter walk test to <20 seconds as to improve gait speed for better community ambulation and to reduce fall risk.    Baseline  8/31: 35.1 seconds with RW 7/27: 36 seconds with RW 6/22: 36.1 seconds 5/19: 42 seconds 10/5: 31.9 seconds with RW    Time 12    Period Weeks    Status Partially Met    Target Date 03/09/20      PT LONG TERM GOAL #4   Title Patient will reduce timed up and go to <11 seconds to reduce fall risk and demonstrate improved transfer/gait ability.    Baseline  8/31: 50.8 seconds with RW 7/27: 43.65 6/22: 45.4 seconds 5/19: 47 seconds 10/5: 38.5 seconds with RW    Time 12    Period Weeks    Status On-going    Target Date 03/09/20      PT LONG TERM GOAL #5   Title Patient will ambulate from physical therapy gym to elevator down the hall, ride elevator to first floor, and walk to valet with RW and chair follow for increased capacity for mobility and ind.    Baseline 8/31: able to open door ~50 ft with one rest break 8/10: unable to ambulate to elevator without rest break 9/30: able to ambulate to elevator with 3 rest breaks    Time 12    Period Weeks    Status Partially Met    Target Date 03/09/20                  Plan - 02/19/20 1154    Clinical Impression Statement Patient appears extremely motivated to perform exercise this session despite body language portraying patient as fatigued. Patient able to perform interventions with no provocation of pain. Gait trial performed at end of session this date, with patient able to perform with good foot clearance and speed until fatigue. The patient would benefit from further skilled PT intervention to maximize QOL, safety, and functional mobility.    Personal Factors and Comorbidities Age;Comorbidity 3+;Fitness;Past/Current Experience;Sex;Time since onset of injury/illness/exacerbation    Comorbidities HTN, SVT, neurogenic bowel, MS (1995), neuropathy, hx of wrist fx, HPV, Hypercholesteremia, adiposity, osteopenia, spasticity.    Examination-Activity Limitations Bathing;Bed Mobility;Bend;Carry;Continence;Dressing;Hygiene/Grooming;Stairs;Squat;Reach Overhead;Locomotion Level;Lift;Stand;Transfers    Examination-Participation Restrictions Church;Cleaning;Community Activity;Laundry;Volunteer;Shop;Meal Prep;Yard Work    [employer] Evolving/Moderate complexity    Rehab Potential Good    PT Frequency 2x / week    PT Duration 12 weeks    PT Treatment/Interventions ADLs/Self Care Home Management;Aquatic Therapy;Biofeedback;Cryotherapy;Electrical Stimulation;Iontophoresis 45m/ml Dexamethasone;Traction;Moist Heat;Ultrasound;DME Instruction;Gait training;Stair training;Functional mobility training;Therapeutic activities;Therapeutic exercise;Balance training;Neuromuscular re-education;Patient/family education;Orthotic Fit/Training;Wheelchair mobility training;Manual techniques;Compression bandaging;Passive range of motion;Dry needling;Energy conservation;Taping;Splinting;Vestibular    PT Next Visit Plan step negotiation, turning directions to the L and R    PT Home Exercise Plan maintain current HEP from HHPT    Consulted and Agree with Plan  of Care Patient           Patient will benefit from skilled therapeutic intervention in order to improve the following deficits and impairments:  Abnormal gait, Decreased activity tolerance, Decreased balance, Decreased coordination, Decreased endurance, Decreased mobility, Decreased range of motion, Decreased  strength, Difficulty walking, Impaired perceived functional ability, Impaired sensation, Impaired tone, Impaired UE functional use, Improper body mechanics, Postural dysfunction, Cardiopulmonary status limiting activity, Impaired flexibility, Increased muscle spasms  Visit Diagnosis: Multiple sclerosis exacerbation ([facility])  Muscle weakness (generalized)  Other abnormalities of gait and mobility  Unsteadiness on feet     Problem List Patient Active Problem List   Diagnosis Date Noted  . Hypoalbuminemia due to protein-calorie malnutrition ([place])   . Neurogenic bowel   . Neurogenic bladder   . Labile blood pressure   . Neuropathic pain   . Abscess of female pelvis   . SVT (supraventricular tachycardia) ([place])   . Radial styloid tenosynovitis 03/12/2018  . Wheelchair confinement 02/27/2018  . Localized osteoporosis with current pathological fracture with routine healing 01/19/2017  . Wrist fracture 01/16/2017  . Sprain of ankle 03/23/2016  . Closed fracture of lateral malleolus 03/16/2016  . Health care maintenance 01/24/2016  . Blood pressure elevated without history of HTN 10/25/2015  . Essential hypertension 10/25/2015  . Multiple sclerosis ([place]) 10/02/2015  . Chronic left shoulder pain 07/19/2015  . Multiple sclerosis exacerbation ([place]) 07/14/2015  . MS (multiple sclerosis) ([place]) 11/26/2014  . Increased body mass index 11/26/2014  . HPV test positive 11/26/2014  . Status post laparoscopic supracervical hysterectomy 11/26/2014  . Galactorrhea 11/26/2014  . Back ache 05/21/2014  . Adiposity 05/21/2014  . Disordered sleep 05/21/2014  . Muscle spasticity 05/21/2014  .  Spasticity 05/21/2014  . Calculus of kidney 12/09/2013  . Renal colic 75/19/8242  . Hypercholesteremia 08/19/2013  . Hereditary and idiopathic neuropathy 08/19/2013  . Hypercholesterolemia without hypertriglyceridemia 08/19/2013  . Bladder infection, chronic 07/25/2012  . Disorder of bladder function 07/25/2012  . Incomplete bladder emptying 07/25/2012  . Microscopic hematuria 07/25/2012  . Right upper quadrant pain 07/25/2012   Tonny Bollman, SPT  This entire session was performed under direct supervision and direction of a licensed therapist/therapist assistant . I have personally read, edited and approve of the note as written.  Janna Arch, PT, DPT   02/19/2020, 11:56 AM  [place] MAIN [facility] SERVICES [address] [place], [place], [address] Phone: [phone]   Fax:  [phone]  Name: Lisa Williams MRN: [id number] Date of Birth: [birth year]

## 2020-02-20 ENCOUNTER — Ambulatory Visit: Payer: PPO | Admitting: Neurology

## 2020-02-24 ENCOUNTER — Other Ambulatory Visit: Payer: Self-pay

## 2020-02-24 ENCOUNTER — Ambulatory Visit: Payer: PPO

## 2020-02-24 DIAGNOSIS — R2689 Other abnormalities of gait and mobility: Secondary | ICD-10-CM

## 2020-02-24 DIAGNOSIS — G35 Multiple sclerosis: Secondary | ICD-10-CM

## 2020-02-24 DIAGNOSIS — M6281 Muscle weakness (generalized): Secondary | ICD-10-CM

## 2020-02-24 DIAGNOSIS — R2681 Unsteadiness on feet: Secondary | ICD-10-CM

## 2020-02-24 NOTE — Therapy (Signed)
[place] MAIN [facility] SERVICES [address] [place], [place], [address] Phone: [phone]   Fax:  [phone]  Physical Therapy Treatment  Patient Details  Name: Lisa Williams Alfred MRN: [id number] Date of Birth: Sep 07, 1960 Referring Provider (PT): Gurney Maxin, MD   Encounter Date: 02/24/2020   PT End of Session - 02/24/20 1146    Visit Number 44    Number of Visits 48    Date for PT Re-Evaluation 03/09/20    Authorization Type 4/10 PN 02/10/20    PT Start Time 1015    PT Stop Time 1059    PT Time Calculation (min) 44 min    Equipment Utilized During Treatment Gait belt;Other (comment)   bilateral AFOs   Activity Tolerance Patient tolerated treatment well    Behavior During Therapy WFL for tasks assessed/performed           Past Medical History:  Diagnosis Date  . Abdominal pain, right upper quadrant   . Back pain   . Calculus of kidney 12/09/2013  . Chronic back pain    unspecified  . Chronic left shoulder pain 07/19/2015  . Functional disorder of bladder    other  . Galactorrhea 11/26/2014   Chronic   . Hereditary and idiopathic neuropathy 08/19/2013  . HPV test positive   . Hypercholesteremia 08/19/2013  . Incomplete bladder emptying   . Microscopic hematuria   . MS (multiple sclerosis) ([place])   . Muscle spasticity 05/21/2014  . Nonspecific findings on examination of urine    other  . Osteopenia   . Status post laparoscopic supracervical hysterectomy 11/26/2014  . Tobacco user 11/26/2014  . Wrist fracture     Past Surgical History:  Procedure Laterality Date  . bilateral tubal ligation  1996  . BREAST CYST EXCISION Left 2002  . KNEE SURGERY     right  . LAPAROSCOPIC SUPRACERVICAL HYSTERECTOMY  08/05/2013  . ORIF WRIST FRACTURE Left 01/17/2017   Procedure: OPEN REDUCTION INTERNAL FIXATION (ORIF) WRIST FRACTURE;  Surgeon: Lovell Sheehan, MD;  Location: [facility] ORS;  Service: Orthopedics;  Laterality: Left;  . TUBAL  LIGATION Bilateral   . VAGINAL HYSTERECTOMY  03/2006    There were no vitals filed for this visit.   Subjective Assessment - 02/24/20 1145    Subjective Patient notes feeling good today due to cooler weather. Patient has been practicing standing at home with inc ease of execution. Denies falls or LOB since last session.    Pertinent History Patient is previous patient of this therapist, was admitted to the hospital on 08/03/19 for MS exacerbation and then went to inpatient rehab on 08/08/19. Patient then received home health therapy and now is returning to outpatient PT. PMh includes HTN, SVT, neurogenic bowel, MS (1995), neuropathy, hx of wrist fx, HPV, Hypercholesteremia, adiposity, osteopenia, spasticity. She wears bilateral AFOs and uses a RW and/or wheelchair for mobility. She drives an adapted car.    Limitations Lifting;Standing;Walking;House hold activities    How long can you sit comfortably? n/a    How long can you stand comfortably? 4 minutes    How long can you walk comfortably? walked 50 ft in inpatient rehab    Patient Stated Goals return to PLOF, increase walking, get into shower, increase mobility    Currently in Pain? No/denies    Pain Onset In the past 7 days            Treatment:  Standing in // Bars:  Shooting basketballs into hoop  to challenge upright tolerance with perturbations. Patient requires SUE support on bar throughout task. 15 x 2 sets; seated rest break between sets   On Mat Table: Sit to stands from elevated mat table to promote BLE use during task, as pt tends to rely on BUE to come to stand. Patient demonstrates improved ability to use BLE and glute strength to rise to standing. Performed 10x, improved with repetition.  Seated:  With Yellow Resistance Band around wrists: Repeated horizontal abduction with palms up, 30 seconds Repeated horizontal abduction with thumbs up, 30 seconds Repeated horizontal abduction with palms down, 30  seconds  Alternating twist hand pattern with green physioball for core and shoulder activation 10x each direction (20x total)    Patient utilizes ice pack for reduction of internal temperature to reduce risk of MS exacerbation and allow continuation of progression of interventions.  Pt educated throughout session about proper posture and technique with exercises. Improved exercise technique, movement at target joints, use of target muscles after min to mod verbal, visual, tactile cues.                        PT Education - 02/24/20 1146    Education provided Yes    Education Details Economist, exercise technique    Person(s) Educated Patient    Methods Explanation;Demonstration;Tactile cues;Verbal cues    Comprehension Tactile cues required;Verbalized understanding;Returned demonstration;Verbal cues required            PT Short Term Goals - 02/10/20 1110      PT SHORT TERM GOAL #1   Title Patient will be independent in home exercise program to improve strength/mobility for better functional independence with ADLs.    Baseline 5/19: HEP next session 6/22: HEP compliant 8/10 : HEP progressions compliant 8/31 HEP compliant 10/5: HEP compliant    Time 4    Period Weeks    Status Achieved    Target Date 01/13/20      PT SHORT TERM GOAL #2   Title Patient will perform a sit to stand with SUE support for increased ind. with transfers.    Baseline 5/19: heavy BUE support 6/22: requires one hand on walker one hand on w/c 8/10: able to perform intermittently (some days can some days cannot) 8/31: attempt next session 10/5: BUE reliance, patient unable to complete 4 full stands. Attempt next session    Time 4    Period Weeks    Status Partially Met    Target Date 01/13/20             PT Long Term Goals - 02/10/20 0001      PT LONG TERM GOAL #1   Title Patient will increase FOTO score to equal to or greater than  53/100   to demonstrate statistically  significant improvement in mobility and quality of life.     Baseline 8/31: 48.13% 7/27:  48.1% 6/22: 46.99% 5/18: 48/100 10/5: 49/100    Time 12    Period Weeks    Status Partially Met    Target Date 03/09/20      PT LONG TERM GOAL #2   Title Patient (< 48 years old) will complete five times sit to stand test in < 20 seconds indicating an increased LE strength and improved balance.    Baseline 7/27: 26.8 seconds with one partial stand 6/22: 40. 86 sec; unable to reach full stand last two attempts 5/19: 42 seconds 10/5: BUE reliance, patient unable to  complete 4 full stands. Attempt next session    Time 12    Period Weeks    Status Partially Met    Target Date 03/09/20      PT LONG TERM GOAL #3   Title Patient will increase 10 meter walk test to <20 seconds as to improve gait speed for better community ambulation and to reduce fall risk.    Baseline  8/31: 35.1 seconds with RW 7/27: 36 seconds with RW 6/22: 36.1 seconds 5/19: 42 seconds 10/5: 31.9 seconds with RW    Time 12    Period Weeks    Status Partially Met    Target Date 03/09/20      PT LONG TERM GOAL #4   Title Patient will reduce timed up and go to <11 seconds to reduce fall risk and demonstrate improved transfer/gait ability.    Baseline  8/31: 50.8 seconds with RW 7/27: 43.65 6/22: 45.4 seconds 5/19: 47 seconds 10/5: 38.5 seconds with RW    Time 12    Period Weeks    Status On-going    Target Date 03/09/20      PT LONG TERM GOAL #5   Title Patient will ambulate from physical therapy gym to elevator down the hall, ride elevator to first floor, and walk to valet with RW and chair follow for increased capacity for mobility and ind.    Baseline 8/31: able to open door ~50 ft with one rest break 8/10: unable to ambulate to elevator without rest break 9/30: able to ambulate to elevator with 3 rest breaks    Time 12    Period Weeks    Status Partially Met    Target Date 03/09/20                 Plan - 02/24/20  1223    Clinical Impression Statement Patient shows efficiency and proper form with sit to stands when focusing on using BLE to stand. Patient able to perform exercise without provocation of pain. Standing interventions performed with less need for seated rest breaks. The patient would benefit from further skilled PT intervention to maximize QOL, safety, and functional mobility.    Personal Factors and Comorbidities Age;Comorbidity 3+;Fitness;Past/Current Experience;Sex;Time since onset of injury/illness/exacerbation    Comorbidities HTN, SVT, neurogenic bowel, MS (1995), neuropathy, hx of wrist fx, HPV, Hypercholesteremia, adiposity, osteopenia, spasticity.    Examination-Activity Limitations Bathing;Bed Mobility;Bend;Carry;Continence;Dressing;Hygiene/Grooming;Stairs;Squat;Reach Overhead;Locomotion Level;Lift;Stand;Transfers    Examination-Participation Restrictions Church;Cleaning;Community Activity;Laundry;Volunteer;Shop;Meal Prep;Yard Work    [employer] officer Evolving/Moderate complexity    Rehab Potential Good    PT Frequency 2x / week    PT Duration 12 weeks    PT Treatment/Interventions ADLs/Self Care Home Management;Aquatic Therapy;Biofeedback;Cryotherapy;Electrical Stimulation;Iontophoresis 42m/ml Dexamethasone;Traction;Moist Heat;Ultrasound;DME Instruction;Gait training;Stair training;Functional mobility training;Therapeutic activities;Therapeutic exercise;Balance training;Neuromuscular re-education;Patient/family education;Orthotic Fit/Training;Wheelchair mobility training;Manual techniques;Compression bandaging;Passive range of motion;Dry needling;Energy conservation;Taping;Splinting;Vestibular    PT Next Visit Plan step negotiation, turning directions to the L and R    PT Home Exercise Plan maintain current HEP from HHPT    Consulted and Agree with Plan of Care Patient           Patient will benefit from skilled therapeutic intervention in order to improve the  following deficits and impairments:  Abnormal gait, Decreased activity tolerance, Decreased balance, Decreased coordination, Decreased endurance, Decreased mobility, Decreased range of motion, Decreased strength, Difficulty walking, Impaired perceived functional ability, Impaired sensation, Impaired tone, Impaired UE functional use, Improper body mechanics, Postural dysfunction, Cardiopulmonary status limiting activity, Impaired flexibility, Increased muscle spasms  Visit Diagnosis: Multiple sclerosis exacerbation ([facility])  Muscle weakness (generalized)  Other abnormalities of gait and mobility  Unsteadiness on feet     Problem List Patient Active Problem List   Diagnosis Date Noted  . Hypoalbuminemia due to protein-calorie malnutrition ([place])   . Neurogenic bowel   . Neurogenic bladder   . Labile blood pressure   . Neuropathic pain   . Abscess of female pelvis   . SVT (supraventricular tachycardia) ([place])   . Radial styloid tenosynovitis 03/12/2018  . Wheelchair confinement 02/27/2018  . Localized osteoporosis with current pathological fracture with routine healing 01/19/2017  . Wrist fracture 01/16/2017  . Sprain of ankle 03/23/2016  . Closed fracture of lateral malleolus 03/16/2016  . Health care maintenance 01/24/2016  . Blood pressure elevated without history of HTN 10/25/2015  . Essential hypertension 10/25/2015  . Multiple sclerosis ([place]) 10/02/2015  . Chronic left shoulder pain 07/19/2015  . Multiple sclerosis exacerbation ([place]) 07/14/2015  . MS (multiple sclerosis) ([place]) 11/26/2014  . Increased body mass index 11/26/2014  . HPV test positive 11/26/2014  . Status post laparoscopic supracervical hysterectomy 11/26/2014  . Galactorrhea 11/26/2014  . Back ache 05/21/2014  . Adiposity 05/21/2014  . Disordered sleep 05/21/2014  . Muscle spasticity 05/21/2014  . Spasticity 05/21/2014  . Calculus of kidney 12/09/2013  . Renal colic 88/87/5797  . Hypercholesteremia  08/19/2013  . Hereditary and idiopathic neuropathy 08/19/2013  . Hypercholesterolemia without hypertriglyceridemia 08/19/2013  . Bladder infection, chronic 07/25/2012  . Disorder of bladder function 07/25/2012  . Incomplete bladder emptying 07/25/2012  . Microscopic hematuria 07/25/2012  . Right upper quadrant pain 07/25/2012   Tonny Bollman, SPT  This entire session was performed under direct supervision and direction of a licensed therapist/therapist assistant . I have personally read, edited and approve of the note as written.  Janna Arch, PT, DPT   02/24/2020, 1:27 PM  [place] [facility]IN [facility] SERVICES [address] [place], [place], [address] Phone: [phone]   Fax:  [phone]  Name: Lataya Varnell Humm MRN: [id number] Date of Birth: June 09, 1960

## 2020-02-26 ENCOUNTER — Other Ambulatory Visit: Payer: Self-pay

## 2020-02-26 ENCOUNTER — Ambulatory Visit: Payer: PPO

## 2020-02-26 DIAGNOSIS — G35 Multiple sclerosis: Secondary | ICD-10-CM

## 2020-02-26 DIAGNOSIS — R2689 Other abnormalities of gait and mobility: Secondary | ICD-10-CM

## 2020-02-26 DIAGNOSIS — M6281 Muscle weakness (generalized): Secondary | ICD-10-CM

## 2020-02-26 DIAGNOSIS — R2681 Unsteadiness on feet: Secondary | ICD-10-CM

## 2020-02-26 NOTE — Therapy (Signed)
[place] MAIN [facility] SERVICES [address] [place], [place], [address] Phone: [phone]   Fax:  [phone]  Physical Therapy Treatment  Patient Details  Name: Lisa Williams MRN: [id number] Date of Birth: January 28, 1961 Referring Provider (PT): Gurney Maxin, MD   Encounter Date: 02/26/2020   PT End of Session - 02/26/20 1215    Visit Number 45    Number of Visits 48    Date for PT Re-Evaluation 03/09/20    Authorization Type 5/10 PN 02/10/20    PT Start Time 1015    PT Stop Time 1059    PT Time Calculation (min) 44 min    Equipment Utilized During Treatment Gait belt;Other (comment)   bilateral AFOs   Activity Tolerance Patient tolerated treatment well    Behavior During Therapy WFL for tasks assessed/performed           Past Medical History:  Diagnosis Date  . Abdominal pain, right upper quadrant   . Back pain   . Calculus of kidney 12/09/2013  . Chronic back pain    unspecified  . Chronic left shoulder pain 07/19/2015  . Functional disorder of bladder    other  . Galactorrhea 11/26/2014   Chronic   . Hereditary and idiopathic neuropathy 08/19/2013  . HPV test positive   . Hypercholesteremia 08/19/2013  . Incomplete bladder emptying   . Microscopic hematuria   . MS (multiple sclerosis) ([place])   . Muscle spasticity 05/21/2014  . Nonspecific findings on examination of urine    other  . Osteopenia   . Status post laparoscopic supracervical hysterectomy 11/26/2014  . Tobacco user 11/26/2014  . Wrist fracture     Past Surgical History:  Procedure Laterality Date  . bilateral tubal ligation  1996  . BREAST CYST EXCISION Left 2002  . KNEE SURGERY     right  . LAPAROSCOPIC SUPRACERVICAL HYSTERECTOMY  08/05/2013  . ORIF WRIST FRACTURE Left 01/17/2017   Procedure: OPEN REDUCTION INTERNAL FIXATION (ORIF) WRIST FRACTURE;  Surgeon: Lovell Sheehan, MD;  Location: [facility] ORS;  Service: Orthopedics;  Laterality: Left;  . TUBAL  LIGATION Bilateral   . VAGINAL HYSTERECTOMY  03/2006    There were no vitals filed for this visit.   Subjective Assessment - 02/26/20 1213    Subjective Patient is feeling good today. Patient states that she as able to stand from low chair at beauty salon with increased ease. Denies falls or LOB since last session.    Pertinent History Patient is previous patient of this therapist, was admitted to the hospital on 08/03/19 for MS exacerbation and then went to inpatient rehab on 08/08/19. Patient then received home health therapy and now is returning to outpatient PT. PMh includes HTN, SVT, neurogenic bowel, MS (1995), neuropathy, hx of wrist fx, HPV, Hypercholesteremia, adiposity, osteopenia, spasticity. She wears bilateral AFOs and uses a RW and/or wheelchair for mobility. She drives an adapted car.    Limitations Lifting;Standing;Walking;House hold activities    How long can you sit comfortably? n/a    How long can you stand comfortably? 4 minutes    How long can you walk comfortably? walked 50 ft in inpatient rehab    Patient Stated Goals return to PLOF, increase walking, get into shower, increase mobility    Currently in Pain? No/denies    Pain Onset In the past 7 days          Treatment:  Gait: Patient ambulates 118f with RW. CGA and wheelchair follow  for safety. Patient able to navigate opening doors, turns, and thresholds independently but with inc time to perform. Patient demonstrates inc reliance on BUE by end of gait trial.  Seated: Overhead press with blue medicine ball. PT cues for core activation during task. 10x  Rows with blue medicine ball. PT cues for core activation during task. 10x  Core activation with alternating arm raises with green physioball. 10 raises each arm (20x total)  Horizontal reach and pass with green physioball. 7x each direction (14x total)  Alternating uppercuts to PT hands. PT brings targets further away to facilitate forward lean and exaggerated  reaches. x30 sec   Alternating cross punches into PT hands. PT cues for upright posture and core activation throughout task. x 30 seconds  Ice pack donned throughout session to minimize exacerbation of MS symptoms in order to perform interventions.    Pt educated throughout session about proper posture and technique with exercises. Improved exercise technique, movement at target joints, use of target muscles after min to mod verbal, visual, tactile cues.                         PT Education - 02/26/20 1214    Education provided Yes    Education Details Economist, exercise technique    Person(s) Educated Patient    Methods Explanation;Demonstration;Tactile cues;Verbal cues    Comprehension Verbalized understanding;Tactile cues required;Returned demonstration;Verbal cues required            PT Short Term Goals - 02/10/20 1110      PT SHORT TERM GOAL #1   Title Patient will be independent in home exercise program to improve strength/mobility for better functional independence with ADLs.    Baseline 5/19: HEP next session 6/22: HEP compliant 8/10 : HEP progressions compliant 8/31 HEP compliant 10/5: HEP compliant    Time 4    Period Weeks    Status Achieved    Target Date 01/13/20      PT SHORT TERM GOAL #2   Title Patient will perform a sit to stand with SUE support for increased ind. with transfers.    Baseline 5/19: heavy BUE support 6/22: requires one hand on walker one hand on w/c 8/10: able to perform intermittently (some days can some days cannot) 8/31: attempt next session 10/5: BUE reliance, patient unable to complete 4 full stands. Attempt next session    Time 4    Period Weeks    Status Partially Met    Target Date 01/13/20             PT Long Term Goals - 02/10/20 0001      PT LONG TERM GOAL #1   Title Patient will increase FOTO score to equal to or greater than  53/100   to demonstrate statistically significant improvement in mobility  and quality of life.     Baseline 8/31: 48.13% 7/27:  48.1% 6/22: 46.99% 5/18: 48/100 10/5: 49/100    Time 12    Period Weeks    Status Partially Met    Target Date 03/09/20      PT LONG TERM GOAL #2   Title Patient (< 73 years old) will complete five times sit to stand test in < 20 seconds indicating an increased LE strength and improved balance.    Baseline 7/27: 26.8 seconds with one partial stand 6/22: 40. 86 sec; unable to reach full stand last two attempts 5/19: 42 seconds 10/5: BUE reliance, patient  unable to complete 4 full stands. Attempt next session    Time 12    Period Weeks    Status Partially Met    Target Date 03/09/20      PT LONG TERM GOAL #3   Title Patient will increase 10 meter walk test to <20 seconds as to improve gait speed for better community ambulation and to reduce fall risk.    Baseline  8/31: 35.1 seconds with RW 7/27: 36 seconds with RW 6/22: 36.1 seconds 5/19: 42 seconds 10/5: 31.9 seconds with RW    Time 12    Period Weeks    Status Partially Met    Target Date 03/09/20      PT LONG TERM GOAL #4   Title Patient will reduce timed up and go to <11 seconds to reduce fall risk and demonstrate improved transfer/gait ability.    Baseline  8/31: 50.8 seconds with RW 7/27: 43.65 6/22: 45.4 seconds 5/19: 47 seconds 10/5: 38.5 seconds with RW    Time 12    Period Weeks    Status On-going    Target Date 03/09/20      PT LONG TERM GOAL #5   Title Patient will ambulate from physical therapy gym to elevator down the hall, ride elevator to first floor, and walk to valet with RW and chair follow for increased capacity for mobility and ind.    Baseline 8/31: able to open door ~50 ft with one rest break 8/10: unable to ambulate to elevator without rest break 9/30: able to ambulate to elevator with 3 rest breaks    Time 12    Period Weeks    Status Partially Met    Target Date 03/09/20                 Plan - 02/26/20 1230    Clinical Impression Statement  Patient demonstrates improvement in gait mechanics this session, as she is able to ambulate further distances with less reliance on BUE. Patient able to perform punches quicker with less fatigue. The patient would benefit from further skilled PT intervention to maximize QOL, safety, and functional mobility.    Personal Factors and Comorbidities Age;Comorbidity 3+;Fitness;Past/Current Experience;Sex;Time since onset of injury/illness/exacerbation    Comorbidities HTN, SVT, neurogenic bowel, MS (1995), neuropathy, hx of wrist fx, HPV, Hypercholesteremia, adiposity, osteopenia, spasticity.    Examination-Activity Limitations Bathing;Bed Mobility;Bend;Carry;Continence;Dressing;Hygiene/Grooming;Stairs;Squat;Reach Overhead;Locomotion Level;Lift;Stand;Transfers    Examination-Participation Restrictions Church;Cleaning;Community Activity;Laundry;Volunteer;Shop;Meal Prep;Yard Work    Merchant [employer] officer Evolving/Moderate complexity    Rehab Potential Good    PT Frequency 2x / week    PT Duration 12 weeks    PT Treatment/Interventions ADLs/Self Care Home Management;Aquatic Therapy;Biofeedback;Cryotherapy;Electrical Stimulation;Iontophoresis 81m/ml Dexamethasone;Traction;Moist Heat;Ultrasound;DME Instruction;Gait training;Stair training;Functional mobility training;Therapeutic activities;Therapeutic exercise;Balance training;Neuromuscular re-education;Patient/family education;Orthotic Fit/Training;Wheelchair mobility training;Manual techniques;Compression bandaging;Passive range of motion;Dry needling;Energy conservation;Taping;Splinting;Vestibular    PT Next Visit Plan step negotiation, turning directions to the L and R    PT Home Exercise Plan maintain current HEP from HHPT    Consulted and Agree with Plan of Care Patient           Patient will benefit from skilled therapeutic intervention in order to improve the following deficits and impairments:  Abnormal gait, Decreased activity  tolerance, Decreased balance, Decreased coordination, Decreased endurance, Decreased mobility, Decreased range of motion, Decreased strength, Difficulty walking, Impaired perceived functional ability, Impaired sensation, Impaired tone, Impaired UE functional use, Improper body mechanics, Postural dysfunction, Cardiopulmonary status limiting activity, Impaired flexibility, Increased muscle spasms  Visit Diagnosis: Multiple  sclerosis exacerbation (HCC)  Muscle weakness (generalized)  Other abnormalities of gait and mobility  Unsteadiness on feet     Problem List Patient Active Problem List   Diagnosis Date Noted  . Hypoalbuminemia due to protein-calorie malnutrition ([place])   . Neurogenic bowel   . Neurogenic bladder   . Labile blood pressure   . Neuropathic pain   . Abscess of female pelvis   . SVT (supraventricular tachycardia) ([place])   . Radial styloid tenosynovitis 03/12/2018  . Wheelchair confinement 02/27/2018  . Localized osteoporosis with current pathological fracture with routine healing 01/19/2017  . Wrist fracture 01/16/2017  . Sprain of ankle 03/23/2016  . Closed fracture of lateral malleolus 03/16/2016  . Health care maintenance 01/24/2016  . Blood pressure elevated without history of HTN 10/25/2015  . Essential hypertension 10/25/2015  . Multiple sclerosis ([place]) 10/02/2015  . Chronic left shoulder pain 07/19/2015  . Multiple sclerosis exacerbation ([place]) 07/14/2015  . MS (multiple sclerosis) ([place]) 11/26/2014  . Increased body mass index 11/26/2014  . HPV test positive 11/26/2014  . Status post laparoscopic supracervical hysterectomy 11/26/2014  . Galactorrhea 11/26/2014  . Back ache 05/21/2014  . Adiposity 05/21/2014  . Disordered sleep 05/21/2014  . Muscle spasticity 05/21/2014  . Spasticity 05/21/2014  . Calculus of kidney 12/09/2013  . Renal colic 16/02/9603  . Hypercholesteremia 08/19/2013  . Hereditary and idiopathic neuropathy 08/19/2013  .  Hypercholesterolemia without hypertriglyceridemia 08/19/2013  . Bladder infection, chronic 07/25/2012  . Disorder of bladder function 07/25/2012  . Incomplete bladder emptying 07/25/2012  . Microscopic hematuria 07/25/2012  . Right upper quadrant pain 07/25/2012   Tonny Bollman, SPT  This entire session was performed under direct supervision and direction of a licensed therapist/therapist assistant . I have personally read, edited and approve of the note as written.  Janna Arch, PT, DPT   02/26/2020, 4:52 PM  [place] MAIN [facility] SERVICES [address] [place], [place], [address] Phone: [phone]   Fax:  [phone]  Name: Niyati Heinke Mcgriff MRN: [id number] Date of Birth: 10-Sep-1960

## 2020-02-27 ENCOUNTER — Other Ambulatory Visit: Payer: PPO

## 2020-02-27 ENCOUNTER — Ambulatory Visit: Payer: PPO

## 2020-02-27 ENCOUNTER — Ambulatory Visit: Payer: PPO | Admitting: Oncology

## 2020-03-01 ENCOUNTER — Ambulatory Visit (INDEPENDENT_AMBULATORY_CARE_PROVIDER_SITE_OTHER): Payer: PPO | Admitting: Neurology

## 2020-03-01 ENCOUNTER — Telehealth: Payer: Self-pay | Admitting: Neurology

## 2020-03-01 ENCOUNTER — Encounter: Payer: Self-pay | Admitting: Neurology

## 2020-03-01 VITALS — BP 156/86 | HR 78 | Ht 71.0 in

## 2020-03-01 DIAGNOSIS — Z5181 Encounter for therapeutic drug level monitoring: Secondary | ICD-10-CM | POA: Diagnosis not present

## 2020-03-01 DIAGNOSIS — G35 Multiple sclerosis: Secondary | ICD-10-CM

## 2020-03-01 NOTE — Progress Notes (Signed)
Reason for visit: Multiple sclerosis  Referring physician: Dr. Deliah Williams Landrigan is a 59 y.o. female  History of present illness:  [name] is a 59 year old right-handed black female with a history of multiple sclerosis that was diagnosed in 89.  The patient had just undergone some surgery on her right knee and had started to drag her leg.  The patient at one point was unable to get up out of bed, she went to the emergency room and underwent further evaluation.  The patient was found to have white matter lesions in the brain that were consistent with multiple sclerosis.  She was treated for multiple sclerosis through Dr. Suzan Slick, she was placed on Avonex and was on this for a number of years.  The patient eventually went off of Avonex and was not on any disease modifying agents for several years.  The patient was placed on Tecfidera in 2018 but could not tolerate the medication due to problems with diarrhea and bloating of the stomach.  The patient has not been able to work since 2001 secondary to severe fatigue associated with MS.  She has developed bilateral lower extremity weakness, she has difficulty with ambulation, she uses bilateral AFO braces and uses a walker for short distance travel and a wheelchair for longer distances.  She is able to operate a motor vehicle with hand controls.  She reports no significant issues controlling the bowels or the bladder.  She currently is in physical therapy.  She has decreased sensation in lower extremities.  She has been treated with rituximab for her multiple sclerosis over the last 2 years.  She is unsure that she wants to continue this mode of therapy.  She comes to this office for further evaluation management of her multiple sclerosis.  She denies any vision changes or difficulty with memory or concentration.  Currently, the patient lives with her mother and sister, she is independent for most of her activities of daily  living.  Past Medical History:  Diagnosis Date  . Abdominal pain, right upper quadrant   . Back pain   . Calculus of kidney 12/09/2013  . Chronic back pain    unspecified  . Chronic left shoulder pain 07/19/2015  . Functional disorder of bladder    other  . Galactorrhea 11/26/2014   Chronic   . Hereditary and idiopathic neuropathy 08/19/2013  . HPV test positive   . Hypercholesteremia 08/19/2013  . Incomplete bladder emptying   . Microscopic hematuria   . MS (multiple sclerosis) (HCC)   . Muscle spasticity 05/21/2014  . Nonspecific findings on examination of urine    other  . Osteopenia   . Status post laparoscopic supracervical hysterectomy 11/26/2014  . Tobacco user 11/26/2014  . Wrist fracture     Past Surgical History:  Procedure Laterality Date  . bilateral tubal ligation  1996  . BREAST CYST EXCISION Left 2002  . KNEE SURGERY     right  . LAPAROSCOPIC SUPRACERVICAL HYSTERECTOMY  08/05/2013  . ORIF WRIST FRACTURE Left 01/17/2017   Procedure: OPEN REDUCTION INTERNAL FIXATION (ORIF) WRIST FRACTURE;  Surgeon: Lyndle Herrlich, MD;  Location: [facility] ORS;  Service: Orthopedics;  Laterality: Left;  . TUBAL LIGATION Bilateral   . VAGINAL HYSTERECTOMY  03/2006    Family History  Problem Relation Age of Onset  . Sickle cell trait Sister   . Hypertension Sister   . Supraventricular tachycardia Sister   . Hypertension Sister   . Diabetes Maternal  Grandmother   . Liver cancer Maternal Grandmother   . Diabetes Maternal Aunt   . Breast cancer Maternal Aunt        great MAT  . Ovarian cancer Paternal Grandmother   . Nephrolithiasis Father   . Hypertension Father   . Prostate cancer Father   . Kidney disease Father   . Heart failure Father   . Hypertension Sister   . Osteoarthritis Mother   . Hypertension Mother   . Hyperlipidemia Mother   . GU problems Neg Hx   . Urolithiasis Neg Hx     Social history:  reports that she has quit smoking. Her smoking use included cigarettes.  She smoked 0.50 packs per day. She has never used smokeless tobacco. She reports that she does not drink alcohol and does not use drugs.  Medications:  Prior to Admission medications   Medication Sig Start Date End Date Taking? Authorizing Provider  baclofen (LIORESAL) 20 MG tablet Take 1 tablet (20 mg total) by mouth 3 (three) times daily. 08/18/19   Angiulli, Mcarthur Rossetti, PA-C  Cholecalciferol (EQL VITAMIN D3) 25 MCG (1000 UT) tablet Take 2,000 Units by mouth daily.    [provider]  famotidine (PEPCID) 20 MG tablet Take 1 tablet (20 mg total) by mouth 2 (two) times daily. 08/18/19   Angiulli, Mcarthur Rossetti, PA-C  gabapentin (NEURONTIN) 100 MG capsule Take 1 capsule (100 mg total) by mouth 2 (two) times daily. 08/18/19   Angiulli, Mcarthur Rossetti, PA-C  ibandronate (BONIVA) 150 MG tablet TAKE 1 TABLET BY MOUTH EVERY 30 DAYS FIRST THING IN THE MORNING WITH A FULL GLASS OF WATER. DO NOT EAT, DRINK OR LIE DOWN FOR 60 MINUTES AFTER 05/12/19   [provider]  Multiple Vitamin (MULTIVITAMIN WITH MINERALS) TABS tablet Take 1 tablet by mouth daily. 08/19/19   Angiulli, Mcarthur Rossetti, PA-C  oxybutynin (DITROPAN-XL) 10 MG 24 hr tablet Take 1 tablet (10 mg total) by mouth daily. 08/18/19   Angiulli, Mcarthur Rossetti, PA-C      Allergies  Allergen Reactions  . Amlodipine Other (See Comments)    edema  . Povidone Iodine Rash    ROS:  Out of a complete 14 system review of symptoms, the patient complains only of the following symptoms, and all other reviewed systems are negative.  Walking difficulty Numbness in the legs Fatigue  Blood pressure (!) 156/86, pulse 78, height 5\' 11"  (1.803 m).  Physical Exam  General: The patient is alert and cooperative at the time of the examination.  Patient is moderately to markedly obese.  Eyes: Pupils are equal, round, and reactive to light. Discs are flat bilaterally.  Neck: The neck is supple, no carotid bruits are noted.  Respiratory: The respiratory examination is  clear.  Cardiovascular: The cardiovascular examination reveals a regular rate and rhythm, no obvious murmurs or rubs are noted.  Skin: Extremities are with 2+ edema below the knees bilaterally  Neurologic Exam  Mental status: The patient is alert and oriented x 3 at the time of the examination. The patient has apparent normal recent and remote memory, with an apparently normal attention span and concentration ability.  Cranial nerves: Facial symmetry is present. There is good sensation of the face to pinprick and soft touch bilaterally. The strength of the facial muscles and the muscles to head turning and shoulder shrug are normal bilaterally. Speech is well enunciated, no aphasia or dysarthria is noted. Extraocular movements are full. Visual fields are full. The tongue is midline,  and the patient has symmetric elevation of the soft palate. No obvious hearing deficits are noted.  Motor: The motor testing reveals 5 over 5 strength of the upper extremities.  The patient has diffuse 3/5 strength in both lower extremities. Good symmetric motor tone is noted throughout.  Sensory: Sensory testing is intact to pinprick, soft touch, vibration sensation, and position sense on the upper extremities.  The patient has decreased pinprick sensation in the left leg as compared to the right, decreased vibration and position sense is seen in both legs no evidence of extinction is noted.  Coordination: Cerebellar testing reveals good finger-nose-finger bilaterally.  The patient not able to perform heel-to-shin on either side.  Gait and station: The patient is able to walk short distances with a walker, the gait is wide-based, slow and deliberate.  Reflexes: Deep tendon reflexes are symmetric and are depressed bilaterally. Toes are upgoing bilaterally.   Assessment/Plan:  1.  Multiple sclerosis  2.  Gait disorder  The patient currently is on rituximab for her multiple sclerosis, she does not wish to  continue the medication.  We discussed potentially starting on Mayzent which has indication for both relapsing remitting and secondary progressive multiple sclerosis.  The patient has developed a significant paraparesis associated with a gait disorder.  The patient will undergo blood work today.  We will try to initiate the Mayzent in the near future, she will see her ophthalmologist.  The patient will follow up here in 6 months.  We will set the patient up for MRI of the brain and cervical spine, she claims that she is extremely claustrophobic and needs general anesthesia.   Marlan Palau MD 03/01/2020 9:23 AM  [facility] [address] [place], [place] [address]  Phone [phone] Fax [phone]

## 2020-03-01 NOTE — Telephone Encounter (Signed)
Health team/medicaid no auth faxed order to [name]. They will reach out to the patient to schedule.

## 2020-03-01 NOTE — Telephone Encounter (Signed)
Patient is scheduled at [facility] for 03/18/20. I put H&P form on Nurse [employer].

## 2020-03-02 ENCOUNTER — Other Ambulatory Visit: Payer: Self-pay

## 2020-03-02 ENCOUNTER — Ambulatory Visit: Payer: PPO

## 2020-03-02 DIAGNOSIS — M6281 Muscle weakness (generalized): Secondary | ICD-10-CM

## 2020-03-02 DIAGNOSIS — G35 Multiple sclerosis: Secondary | ICD-10-CM | POA: Diagnosis not present

## 2020-03-02 DIAGNOSIS — R2689 Other abnormalities of gait and mobility: Secondary | ICD-10-CM

## 2020-03-02 DIAGNOSIS — R2681 Unsteadiness on feet: Secondary | ICD-10-CM

## 2020-03-02 NOTE — Therapy (Signed)
[place] MAIN [facility] SERVICES [address] [place], [place], [address] Phone: [phone]   Fax:  [phone]  Physical Therapy Treatment  Patient Details  Name: Lisa Williams MRN: [id number] Date of Birth: 1960/08/10 Referring Provider (PT): Gurney Maxin, MD   Encounter Date: 03/02/2020   PT End of Session - 03/02/20 1130    Visit Number 46    Number of Visits 48    Date for PT Re-Evaluation 03/09/20    Authorization Type 6/10 PN 02/10/20    PT Start Time 1015    PT Stop Time 1058    PT Time Calculation (min) 43 min    Equipment Utilized During Treatment Gait belt;Other (comment)   bilateral AFOs   Activity Tolerance Patient tolerated treatment well    Behavior During Therapy WFL for tasks assessed/performed           Past Medical History:  Diagnosis Date  . Abdominal pain, right upper quadrant   . Back pain   . Calculus of kidney 12/09/2013  . Chronic back pain    unspecified  . Chronic left shoulder pain 07/19/2015  . Functional disorder of bladder    other  . Galactorrhea 11/26/2014   Chronic   . Hereditary and idiopathic neuropathy 08/19/2013  . HPV test positive   . Hypercholesteremia 08/19/2013  . Incomplete bladder emptying   . Microscopic hematuria   . MS (multiple sclerosis) ([place])   . Muscle spasticity 05/21/2014  . Nonspecific findings on examination of urine    other  . Osteopenia   . Status post laparoscopic supracervical hysterectomy 11/26/2014  . Tobacco user 11/26/2014  . Wrist fracture     Past Surgical History:  Procedure Laterality Date  . bilateral tubal ligation  1996  . BREAST CYST EXCISION Left 2002  . KNEE SURGERY     right  . LAPAROSCOPIC SUPRACERVICAL HYSTERECTOMY  08/05/2013  . ORIF WRIST FRACTURE Left 01/17/2017   Procedure: OPEN REDUCTION INTERNAL FIXATION (ORIF) WRIST FRACTURE;  Surgeon: Lovell Sheehan, MD;  Location: [facility] ORS;  Service: Orthopedics;  Laterality: Left;  . TUBAL  LIGATION Bilateral   . VAGINAL HYSTERECTOMY  03/2006    There were no vitals filed for this visit.   Subjective Assessment - 03/02/20 1129    Subjective Patient is feeling good today due to cooler weather. Patient states having appt with doctor yesterday and was positive about the encounter. Denies falls or LOB since last session    Pertinent History Patient is previous patient of this therapist, was admitted to the hospital on 08/03/19 for MS exacerbation and then went to inpatient rehab on 08/08/19. Patient then received home health therapy and now is returning to outpatient PT. PMh includes HTN, SVT, neurogenic bowel, MS (1995), neuropathy, hx of wrist fx, HPV, Hypercholesteremia, adiposity, osteopenia, spasticity. She wears bilateral AFOs and uses a RW and/or wheelchair for mobility. She drives an adapted car.    Limitations Lifting;Standing;Walking;House hold activities    How long can you sit comfortably? n/a    How long can you stand comfortably? 4 minutes    How long can you walk comfortably? walked 50 ft in inpatient rehab    Patient Stated Goals return to PLOF, increase walking, get into shower, increase mobility    Currently in Pain? No/denies    Pain Onset In the past 7 days          Treatment:  Standing in // Bars: Alternating cross jabs to challenge upright  tolerance and dynamic standing balance with perturbations. 10 x 2 sets. Patient able to be without UE support for ~4 seconds before requiring SUE support to stabilize.  Dart throwing with SUE support to further challenge upright tolerance and balance. CGA for safety. 10 x 1 set    Seated: Ball squeezes 12 x 3 second holds. Resisted hip abduction with red resistance band. 12x 3 second holds. Steer the ships with 5# bar. 10x 2 sets Alternating row with 5# bar. 10 x 2 sets each arm.  Ice pack donned throughout session to minimize exacerbation of MS symptoms in order to perform interventions.    Pt educated throughout  session about proper posture and technique with exercises. Improved exercise technique, movement at target joints, use of target muscles after min to mod verbal, visual, tactile cues.                          PT Education - 03/02/20 1130    Education provided Yes    Education Details exercise technique, breathing, body mechanics    Person(s) Educated Patient    Methods Explanation;Demonstration;Tactile cues;Verbal cues    Comprehension Tactile cues required;Verbalized understanding;Returned demonstration;Verbal cues required            PT Short Term Goals - 02/10/20 1110      PT SHORT TERM GOAL #1   Title Patient will be independent in home exercise program to improve strength/mobility for better functional independence with ADLs.    Baseline 5/19: HEP next session 6/22: HEP compliant 8/10 : HEP progressions compliant 8/31 HEP compliant 10/5: HEP compliant    Time 4    Period Weeks    Status Achieved    Target Date 01/13/20      PT SHORT TERM GOAL #2   Title Patient will perform a sit to stand with SUE support for increased ind. with transfers.    Baseline 5/19: heavy BUE support 6/22: requires one hand on walker one hand on w/c 8/10: able to perform intermittently (some days can some days cannot) 8/31: attempt next session 10/5: BUE reliance, patient unable to complete 4 full stands. Attempt next session    Time 4    Period Weeks    Status Partially Met    Target Date 01/13/20             PT Long Term Goals - 02/10/20 0001      PT LONG TERM GOAL #1   Title Patient will increase FOTO score to equal to or greater than  53/100   to demonstrate statistically significant improvement in mobility and quality of life.     Baseline 8/31: 48.13% 7/27:  48.1% 6/22: 46.99% 5/18: 48/100 10/5: 49/100    Time 12    Period Weeks    Status Partially Met    Target Date 03/09/20      PT LONG TERM GOAL #2   Title Patient (< 67 years old) will complete five times sit  to stand test in < 20 seconds indicating an increased LE strength and improved balance.    Baseline 7/27: 26.8 seconds with one partial stand 6/22: 40. 86 sec; unable to reach full stand last two attempts 5/19: 42 seconds 10/5: BUE reliance, patient unable to complete 4 full stands. Attempt next session    Time 12    Period Weeks    Status Partially Met    Target Date 03/09/20      PT LONG  TERM GOAL #3   Title Patient will increase 10 meter walk test to <20 seconds as to improve gait speed for better community ambulation and to reduce fall risk.    Baseline  8/31: 35.1 seconds with RW 7/27: 36 seconds with RW 6/22: 36.1 seconds 5/19: 42 seconds 10/5: 31.9 seconds with RW    Time 12    Period Weeks    Status Partially Met    Target Date 03/09/20      PT LONG TERM GOAL #4   Title Patient will reduce timed up and go to <11 seconds to reduce fall risk and demonstrate improved transfer/gait ability.    Baseline  8/31: 50.8 seconds with RW 7/27: 43.65 6/22: 45.4 seconds 5/19: 47 seconds 10/5: 38.5 seconds with RW    Time 12    Period Weeks    Status On-going    Target Date 03/09/20      PT LONG TERM GOAL #5   Title Patient will ambulate from physical therapy gym to elevator down the hall, ride elevator to first floor, and walk to valet with RW and chair follow for increased capacity for mobility and ind.    Baseline 8/31: able to open door ~50 ft with one rest break 8/10: unable to ambulate to elevator without rest break 9/30: able to ambulate to elevator with 3 rest breaks    Time 12    Period Weeks    Status Partially Met    Target Date 03/09/20                 Plan - 03/02/20 1134    Clinical Impression Statement Patient displays improved unsupported dynamic standing balance, evidenced by being able to stand for ~4 seconds before leaning forward. Patient demonstrates improved muscular endurance in BUEs. Patient able to perform interventions with no provocation of pain. The  patient would benefit from further skilled PT intervention to maximize QOL, safety, and functional mobility.    Personal Factors and Comorbidities Age;Comorbidity 3+;Fitness;Past/Current Experience;Sex;Time since onset of injury/illness/exacerbation    Comorbidities HTN, SVT, neurogenic bowel, MS (1995), neuropathy, hx of wrist fx, HPV, Hypercholesteremia, adiposity, osteopenia, spasticity.    Examination-Activity Limitations Bathing;Bed Mobility;Bend;Carry;Continence;Dressing;Hygiene/Grooming;Stairs;Squat;Reach Overhead;Locomotion Level;Lift;Stand;Transfers    Examination-Participation Restrictions Church;Cleaning;Community Activity;Laundry;Volunteer;Shop;Meal Prep;Yard Work    [employer] Evolving/Moderate complexity    Rehab Potential Good    PT Frequency 2x / week    PT Duration 12 weeks    PT Treatment/Interventions ADLs/Self Care Home Management;Aquatic Therapy;Biofeedback;Cryotherapy;Electrical Stimulation;Iontophoresis 68m/ml Dexamethasone;Traction;Moist Heat;Ultrasound;DME Instruction;Gait training;Stair training;Functional mobility training;Therapeutic activities;Therapeutic exercise;Balance training;Neuromuscular re-education;Patient/family education;Orthotic Fit/Training;Wheelchair mobility training;Manual techniques;Compression bandaging;Passive range of motion;Dry needling;Energy conservation;Taping;Splinting;Vestibular    PT Next Visit Plan step negotiation, turning directions to the L and R    PT Home Exercise Plan maintain current HEP from HHPT    Consulted and Agree with Plan of Care Patient           Patient will benefit from skilled therapeutic intervention in order to improve the following deficits and impairments:  Abnormal gait, Decreased activity tolerance, Decreased balance, Decreased coordination, Decreased endurance, Decreased mobility, Decreased range of motion, Decreased strength, Difficulty walking, Impaired perceived functional ability, Impaired  sensation, Impaired tone, Impaired UE functional use, Improper body mechanics, Postural dysfunction, Cardiopulmonary status limiting activity, Impaired flexibility, Increased muscle spasms  Visit Diagnosis: Multiple sclerosis exacerbation (H[facility])  Muscle weakness (generalized)  Other abnormalities of gait and mobility  Unsteadiness on feet     Problem List Patient Active Problem List   Diagnosis Date  Noted  . Hypoalbuminemia due to protein-calorie malnutrition ([place])   . Neurogenic bowel   . Neurogenic bladder   . Labile blood pressure   . Neuropathic pain   . Abscess of female pelvis   . SVT (supraventricular tachycardia) ([place])   . Radial styloid tenosynovitis 03/12/2018  . Wheelchair confinement 02/27/2018  . Localized osteoporosis with current pathological fracture with routine healing 01/19/2017  . Wrist fracture 01/16/2017  . Sprain of ankle 03/23/2016  . Closed fracture of lateral malleolus 03/16/2016  . Health care maintenance 01/24/2016  . Blood pressure elevated without history of HTN 10/25/2015  . Essential hypertension 10/25/2015  . Multiple sclerosis ([place]) 10/02/2015  . Chronic left shoulder pain 07/19/2015  . Multiple sclerosis exacerbation ([place]) 07/14/2015  . MS (multiple sclerosis) ([place]) 11/26/2014  . Increased body mass index 11/26/2014  . HPV test positive 11/26/2014  . Status post laparoscopic supracervical hysterectomy 11/26/2014  . Galactorrhea 11/26/2014  . Back ache 05/21/2014  . Adiposity 05/21/2014  . Disordered sleep 05/21/2014  . Muscle spasticity 05/21/2014  . Spasticity 05/21/2014  . Calculus of kidney 12/09/2013  . Renal colic 61/25/4832  . Hypercholesteremia 08/19/2013  . Hereditary and idiopathic neuropathy 08/19/2013  . Hypercholesterolemia without hypertriglyceridemia 08/19/2013  . Bladder infection, chronic 07/25/2012  . Disorder of bladder function 07/25/2012  . Incomplete bladder emptying 07/25/2012  . Microscopic hematuria  07/25/2012  . Right upper quadrant pain 07/25/2012   Tonny Bollman, SPT This entire session was performed under direct supervision and direction of a licensed therapist/therapist assistant . I have personally read, edited and approve of the note as written.  Janna Arch, PT, DPT   03/02/2020, 11:35 AM  [place] MAIN [facility] SERVICES [address] [place], [place], [address] Phone: [phone]   Fax:  [phone]  Name: Lisa Williams MRN: [id number] Date of Birth: 1960/08/31

## 2020-03-04 ENCOUNTER — Other Ambulatory Visit: Payer: Self-pay

## 2020-03-04 ENCOUNTER — Ambulatory Visit: Payer: PPO

## 2020-03-04 ENCOUNTER — Telehealth: Payer: Self-pay | Admitting: Neurology

## 2020-03-04 DIAGNOSIS — G35 Multiple sclerosis: Secondary | ICD-10-CM | POA: Diagnosis not present

## 2020-03-04 DIAGNOSIS — R2681 Unsteadiness on feet: Secondary | ICD-10-CM

## 2020-03-04 DIAGNOSIS — R2689 Other abnormalities of gait and mobility: Secondary | ICD-10-CM

## 2020-03-04 DIAGNOSIS — M6281 Muscle weakness (generalized): Secondary | ICD-10-CM

## 2020-03-04 NOTE — Telephone Encounter (Signed)
H&P form faxed to [facility] at [phone] on 03/04/2020.

## 2020-03-04 NOTE — Therapy (Signed)
[place] MAIN [facility] SERVICES [address] [place], [place], [address] Phone: [phone]   Fax:  [phone]  Physical Therapy Treatment  Patient Details  Name: Lisa Williams MRN: [id number] Date of Birth: 08/11/1960 Referring Provider (PT): Gurney Maxin, MD   Encounter Date: 03/04/2020   PT End of Session - 03/04/20 1157    Visit Number 47    Number of Visits 48    Date for PT Re-Evaluation 03/09/20    Authorization Type 7/10 PN 02/10/20    PT Start Time 1015    PT Stop Time 1059    PT Time Calculation (min) 44 min    Equipment Utilized During Treatment Gait belt;Other (comment)   bilateral AFOs   Activity Tolerance Patient tolerated treatment well    Behavior During Therapy WFL for tasks assessed/performed           Past Medical History:  Diagnosis Date  . Abdominal pain, right upper quadrant   . Back pain   . Calculus of kidney 12/09/2013  . Chronic back pain    unspecified  . Chronic left shoulder pain 07/19/2015  . Functional disorder of bladder    other  . Galactorrhea 11/26/2014   Chronic   . Hereditary and idiopathic neuropathy 08/19/2013  . HPV test positive   . Hypercholesteremia 08/19/2013  . Incomplete bladder emptying   . Microscopic hematuria   . MS (multiple sclerosis) ([place])   . Muscle spasticity 05/21/2014  . Nonspecific findings on examination of urine    other  . Osteopenia   . Status post laparoscopic supracervical hysterectomy 11/26/2014  . Tobacco user 11/26/2014  . Wrist fracture     Past Surgical History:  Procedure Laterality Date  . bilateral tubal ligation  1996  . BREAST CYST EXCISION Left 2002  . KNEE SURGERY     right  . LAPAROSCOPIC SUPRACERVICAL HYSTERECTOMY  08/05/2013  . ORIF WRIST FRACTURE Left 01/17/2017   Procedure: OPEN REDUCTION INTERNAL FIXATION (ORIF) WRIST FRACTURE;  Surgeon: Lovell Sheehan, MD;  Location: [facility] ORS;  Service: Orthopedics;  Laterality: Left;  . TUBAL  LIGATION Bilateral   . VAGINAL HYSTERECTOMY  03/2006    There were no vitals filed for this visit.   Subjective Assessment - 03/04/20 1156    Subjective Patient is feeling good today due to cooler weather. Denies falls or LOB since last session    Pertinent History Patient is previous patient of this therapist, was admitted to the hospital on 08/03/19 for MS exacerbation and then went to inpatient rehab on 08/08/19. Patient then received home health therapy and now is returning to outpatient PT. PMh includes HTN, SVT, neurogenic bowel, MS (1995), neuropathy, hx of wrist fx, HPV, Hypercholesteremia, adiposity, osteopenia, spasticity. She wears bilateral AFOs and uses a RW and/or wheelchair for mobility. She drives an adapted car.    Limitations Lifting;Standing;Walking;House hold activities    How long can you sit comfortably? n/a    How long can you stand comfortably? 4 minutes    How long can you walk comfortably? walked 50 ft in inpatient rehab    Patient Stated Goals return to PLOF, increase walking, get into shower, increase mobility    Currently in Pain? No/denies    Pain Onset In the past 7 days           Treatment:   On Mat Table: Sit to stands from elevated mat table to promote BLE use during task, as pt tends to rely  on BUE to come to stand. Patient demonstrates improved ability to use BLE and glute strength to rise to standing. Performed 10x, improved with repetition.  Ball kicks with feet off ground. Patient able to actively activate muscles to kick instead of using momentum from previous kick. 12x each leg x 2 sets.  Gait: Patient ambulates~72f with RW. CGA and wheelchair follow for safety. Patient able to navigate opening doors, turns, and thresholds independently but with inc time to perform.Patient demonstrates inc reliance on BUE by end of gait trial.  Seated: Core activation with green physioball in lap. Performed 10 x 3 second holds  Ice pack donned throughout  session to minimize exacerbation of MS symptoms in order to perform interventions.  Pt educated throughout session about proper posture and technique with exercises. Improved exercise technique, movement at target joints, use of target muscles after min to mod verbal, visual, tactile cues.                       PT Education - 03/04/20 1156    Education provided Yes    Education Details body mechanics, exercise technique    Person(s) Educated Patient    Methods Tactile cues;Demonstration;Explanation;Verbal cues    Comprehension Verbalized understanding;Tactile cues required;Returned demonstration;Verbal cues required            PT Short Term Goals - 02/10/20 1110      PT SHORT TERM GOAL #1   Title Patient will be independent in home exercise program to improve strength/mobility for better functional independence with ADLs.    Baseline 5/19: HEP next session 6/22: HEP compliant 8/10 : HEP progressions compliant 8/31 HEP compliant 10/5: HEP compliant    Time 4    Period Weeks    Status Achieved    Target Date 01/13/20      PT SHORT TERM GOAL #2   Title Patient will perform a sit to stand with SUE support for increased ind. with transfers.    Baseline 5/19: heavy BUE support 6/22: requires one hand on walker one hand on w/c 8/10: able to perform intermittently (some days can some days cannot) 8/31: attempt next session 10/5: BUE reliance, patient unable to complete 4 full stands. Attempt next session    Time 4    Period Weeks    Status Partially Met    Target Date 01/13/20             PT Long Term Goals - 02/10/20 0001      PT LONG TERM GOAL #1   Title Patient will increase FOTO score to equal to or greater than  53/100   to demonstrate statistically significant improvement in mobility and quality of life.     Baseline 8/31: 48.13% 7/27:  48.1% 6/22: 46.99% 5/18: 48/100 10/5: 49/100    Time 12    Period Weeks    Status Partially Met    Target Date  03/09/20      PT LONG TERM GOAL #2   Title Patient (< 664years old) will complete five times sit to stand test in < 20 seconds indicating an increased LE strength and improved balance.    Baseline 7/27: 26.8 seconds with one partial stand 6/22: 40. 86 sec; unable to reach full stand last two attempts 5/19: 42 seconds 10/5: BUE reliance, patient unable to complete 4 full stands. Attempt next session    Time 12    Period Weeks    Status Partially Met  Target Date 03/09/20      PT LONG TERM GOAL #3   Title Patient will increase 10 meter walk test to <20 seconds as to improve gait speed for better community ambulation and to reduce fall risk.    Baseline  8/31: 35.1 seconds with RW 7/27: 36 seconds with RW 6/22: 36.1 seconds 5/19: 42 seconds 10/5: 31.9 seconds with RW    Time 12    Period Weeks    Status Partially Met    Target Date 03/09/20      PT LONG TERM GOAL #4   Title Patient will reduce timed up and go to <11 seconds to reduce fall risk and demonstrate improved transfer/gait ability.    Baseline  8/31: 50.8 seconds with RW 7/27: 43.65 6/22: 45.4 seconds 5/19: 47 seconds 10/5: 38.5 seconds with RW    Time 12    Period Weeks    Status On-going    Target Date 03/09/20      PT LONG TERM GOAL #5   Title Patient will ambulate from physical therapy gym to elevator down the hall, ride elevator to first floor, and walk to valet with RW and chair follow for increased capacity for mobility and ind.    Baseline 8/31: able to open door ~50 ft with one rest break 8/10: unable to ambulate to elevator without rest break 9/30: able to ambulate to elevator with 3 rest breaks    Time 12    Period Weeks    Status Partially Met    Target Date 03/09/20                 Plan - 03/04/20 1204    Clinical Impression Statement Patient displays improvement in sit to stand task, as she is able to better recruit LE musculature instead of relying on BUEs. Patient able to ambulate longer distances  despite initiating therapy with LE strengthening interventions. Patient requires longer therapeutic rest breaks during gait trial due to inc fatigue. Patient able to perform interventions with no provocation of pain. The patient would benefit from further skilled PT intervention to maximize QOL, safety, and functional mobility.    Personal Factors and Comorbidities Age;Comorbidity 3+;Fitness;Past/Current Experience;Sex;Time since onset of injury/illness/exacerbation    Comorbidities HTN, SVT, neurogenic bowel, MS (1995), neuropathy, hx of wrist fx, HPV, Hypercholesteremia, adiposity, osteopenia, spasticity.    Examination-Activity Limitations Bathing;Bed Mobility;Bend;Carry;Continence;Dressing;Hygiene/Grooming;Stairs;Squat;Reach Overhead;Locomotion Level;Lift;Stand;Transfers    Examination-Participation Restrictions Church;Cleaning;Community Activity;Laundry;Volunteer;Shop;Meal Prep;Yard Work    [employer] Evolving/Moderate complexity    Rehab Potential Good    PT Frequency 2x / week    PT Duration 12 weeks    PT Treatment/Interventions ADLs/Self Care Home Management;Aquatic Therapy;Biofeedback;Cryotherapy;Electrical Stimulation;Iontophoresis 39m/ml Dexamethasone;Traction;Moist Heat;Ultrasound;DME Instruction;Gait training;Stair training;Functional mobility training;Therapeutic activities;Therapeutic exercise;Balance training;Neuromuscular re-education;Patient/family education;Orthotic Fit/Training;Wheelchair mobility training;Manual techniques;Compression bandaging;Passive range of motion;Dry needling;Energy conservation;Taping;Splinting;Vestibular    PT Next Visit Plan step negotiation, turning directions to the L and R    PT Home Exercise Plan maintain current HEP from HHPT    Consulted and Agree with Plan of Care Patient           Patient will benefit from skilled therapeutic intervention in order to improve the following deficits and impairments:  Abnormal gait,  Decreased activity tolerance, Decreased balance, Decreased coordination, Decreased endurance, Decreased mobility, Decreased range of motion, Decreased strength, Difficulty walking, Impaired perceived functional ability, Impaired sensation, Impaired tone, Impaired UE functional use, Improper body mechanics, Postural dysfunction, Cardiopulmonary status limiting activity, Impaired flexibility, Increased muscle spasms  Visit Diagnosis: Multiple  sclerosis exacerbation (H[facility])  Muscle weakness (generalized)  Other abnormalities of gait and mobility  Unsteadiness on feet     Problem List Patient Active Problem List   Diagnosis Date Noted  . Hypoalbuminemia due to protein-calorie malnutrition ([place])   . Neurogenic bowel   . Neurogenic bladder   . Labile blood pressure   . Neuropathic pain   . Abscess of female pelvis   . SVT (supraventricular tachycardia) ([place])   . Radial styloid tenosynovitis 03/12/2018  . Wheelchair confinement 02/27/2018  . Localized osteoporosis with current pathological fracture with routine healing 01/19/2017  . Wrist fracture 01/16/2017  . Sprain of ankle 03/23/2016  . Closed fracture of lateral malleolus 03/16/2016  . Health care maintenance 01/24/2016  . Blood pressure elevated without history of HTN 10/25/2015  . Essential hypertension 10/25/2015  . Multiple sclerosis ([place]) 10/02/2015  . Chronic left shoulder pain 07/19/2015  . Multiple sclerosis exacerbation ([place]) 07/14/2015  . MS (multiple sclerosis) ([place]) 11/26/2014  . Increased body mass index 11/26/2014  . HPV test positive 11/26/2014  . Status post laparoscopic supracervical hysterectomy 11/26/2014  . Galactorrhea 11/26/2014  . Back ache 05/21/2014  . Adiposity 05/21/2014  . Disordered sleep 05/21/2014  . Muscle spasticity 05/21/2014  . Spasticity 05/21/2014  . Calculus of kidney 12/09/2013  . Renal colic 85/63/1497  . Hypercholesteremia 08/19/2013  . Hereditary and idiopathic neuropathy  08/19/2013  . Hypercholesterolemia without hypertriglyceridemia 08/19/2013  . Bladder infection, chronic 07/25/2012  . Disorder of bladder function 07/25/2012  . Incomplete bladder emptying 07/25/2012  . Microscopic hematuria 07/25/2012  . Right upper quadrant pain 07/25/2012   Tonny Bollman, SPT  This entire session was performed under direct supervision and direction of a licensed therapist/therapist assistant . I have personally read, edited and approve of the note as written.  Janna Arch, PT, DPT   03/04/2020, 12:04 PM  [place] MAIN [facility] SERVICES [address] [place], [place], [address] Phone: [phone]   Fax:  [phone]  Name: Caliann Leckrone Schiffman MRN: [id number] Date of Birth: 09/10/1960

## 2020-03-08 LAB — COMPREHENSIVE METABOLIC PANEL
ALT: 16 IU/L (ref 0–32)
AST: 15 IU/L (ref 0–40)
Albumin/Globulin Ratio: 1.7 (ref 1.2–2.2)
Albumin: 4.3 g/dL (ref 3.8–4.9)
Alkaline Phosphatase: 136 IU/L — ABNORMAL HIGH (ref 44–121)
BUN/Creatinine Ratio: 34 — ABNORMAL HIGH (ref 9–23)
BUN: 22 mg/dL (ref 6–24)
Bilirubin Total: 0.5 mg/dL (ref 0.0–1.2)
CO2: 23 mmol/L (ref 20–29)
Calcium: 8.9 mg/dL (ref 8.7–10.2)
Chloride: 107 mmol/L — ABNORMAL HIGH (ref 96–106)
Creatinine, Ser: 0.64 mg/dL (ref 0.57–1.00)
GFR calc Af Amer: 114 mL/min/{1.73_m2} (ref >59)
GFR calc non Af Amer: 99 mL/min/{1.73_m2} (ref >59)
Globulin, Total: 2.6 g/dL (ref 1.5–4.5)
Glucose: 89 mg/dL (ref 65–99)
Potassium: 4 mmol/L (ref 3.5–5.2)
Sodium: 144 mmol/L (ref 134–144)
Total Protein: 6.9 g/dL (ref 6.0–8.5)

## 2020-03-08 LAB — CBC WITH DIFFERENTIAL/PLATELET
Basophils Absolute: 0.1 10*3/uL (ref 0.0–0.2)
Basos: 1 %
EOS (ABSOLUTE): 0.1 10*3/uL (ref 0.0–0.4)
Eos: 2 %
Hematocrit: 40.6 % (ref 34.0–46.6)
Hemoglobin: 13.1 g/dL (ref 11.1–15.9)
Immature Grans (Abs): 0 10*3/uL (ref 0.0–0.1)
Immature Granulocytes: 0 %
Lymphocytes Absolute: 1.6 10*3/uL (ref 0.7–3.1)
Lymphs: 24 %
MCH: 26.3 pg — ABNORMAL LOW (ref 26.6–33.0)
MCHC: 32.3 g/dL (ref 31.5–35.7)
MCV: 81 fL (ref 79–97)
Monocytes Absolute: 0.5 10*3/uL (ref 0.1–0.9)
Monocytes: 7 %
Neutrophils Absolute: 4.5 10*3/uL (ref 1.4–7.0)
Neutrophils: 66 %
Platelets: 227 10*3/uL (ref 150–450)
RBC: 4.99 x10E6/uL (ref 3.77–5.28)
RDW: 14.9 % (ref 11.7–15.4)
WBC: 6.9 10*3/uL (ref 3.4–10.8)

## 2020-03-08 LAB — QUANTIFERON-TB GOLD PLUS
QuantiFERON Mitogen Value: 3.84 IU/mL
QuantiFERON Nil Value: 0.02 IU/mL
QuantiFERON TB1 Ag Value: 0.03 IU/mL
QuantiFERON TB2 Ag Value: 0.02 IU/mL
QuantiFERON-TB Gold Plus: NEGATIVE

## 2020-03-08 LAB — HEPATITIS C ANTIBODY: Hep C Virus Ab: 0.1 s/co ratio (ref 0.0–0.9)

## 2020-03-08 LAB — HEPATITIS B CORE ANTIBODY, TOTAL: Hep B Core Total Ab: NEGATIVE

## 2020-03-08 LAB — CYTOCHROME P450 2C9 GENOTYPING

## 2020-03-08 LAB — HEPATITIS B SURFACE ANTIBODY,QUALITATIVE: Hep B Surface Ab, Qual: NONREACTIVE

## 2020-03-08 LAB — VARICELLA ZOSTER ANTIBODY, IGG: Varicella zoster IgG: 3051 index (ref >165)

## 2020-03-09 ENCOUNTER — Other Ambulatory Visit: Payer: Self-pay

## 2020-03-09 ENCOUNTER — Telehealth: Payer: Self-pay | Admitting: Emergency Medicine

## 2020-03-09 ENCOUNTER — Ambulatory Visit: Payer: PPO | Attending: Neurology

## 2020-03-09 DIAGNOSIS — M6281 Muscle weakness (generalized): Secondary | ICD-10-CM

## 2020-03-09 DIAGNOSIS — R2689 Other abnormalities of gait and mobility: Secondary | ICD-10-CM | POA: Insufficient documentation

## 2020-03-09 DIAGNOSIS — G35 Multiple sclerosis: Secondary | ICD-10-CM | POA: Diagnosis not present

## 2020-03-09 DIAGNOSIS — R2681 Unsteadiness on feet: Secondary | ICD-10-CM | POA: Insufficient documentation

## 2020-03-09 NOTE — Addendum Note (Signed)
Addended by: Claudie Fisherman on: 03/09/2020 03:49 PM   Modules accepted: Orders

## 2020-03-09 NOTE — Therapy (Signed)
[place] MAIN [facility] SERVICES [address] [place], [place], [address] Phone: [phone]   Fax:  [phone]  Physical Therapy Treatment/RECERT  Patient Details  Name: Lisa Williams MRN: [id number] Date of Birth: 09-09-1960 Referring Provider (PT): Gurney Maxin, MD   Encounter Date: 03/09/2020   PT End of Session - 03/09/20 1030    Visit Number 48    Number of Visits 72    Date for PT Re-Evaluation 06/01/20    Authorization Type 8/10 PN 02/10/20    PT Start Time 1013    PT Stop Time 1058    PT Time Calculation (min) 45 min    Equipment Utilized During Treatment Gait belt;Other (comment)   bilateral AFOs   Activity Tolerance Patient tolerated treatment well    Behavior During Therapy WFL for tasks assessed/performed           Past Medical History:  Diagnosis Date  . Abdominal pain, right upper quadrant   . Back pain   . Calculus of kidney 12/09/2013  . Chronic back pain    unspecified  . Chronic left shoulder pain 07/19/2015  . Functional disorder of bladder    other  . Galactorrhea 11/26/2014   Chronic   . Hereditary and idiopathic neuropathy 08/19/2013  . HPV test positive   . Hypercholesteremia 08/19/2013  . Incomplete bladder emptying   . Microscopic hematuria   . MS (multiple sclerosis) ([place])   . Muscle spasticity 05/21/2014  . Nonspecific findings on examination of urine    other  . Osteopenia   . Status post laparoscopic supracervical hysterectomy 11/26/2014  . Tobacco user 11/26/2014  . Wrist fracture     Past Surgical History:  Procedure Laterality Date  . bilateral tubal ligation  1996  . BREAST CYST EXCISION Left 2002  . KNEE SURGERY     right  . LAPAROSCOPIC SUPRACERVICAL HYSTERECTOMY  08/05/2013  . ORIF WRIST FRACTURE Left 01/17/2017   Procedure: OPEN REDUCTION INTERNAL FIXATION (ORIF) WRIST FRACTURE;  Surgeon: Lovell Sheehan, MD;  Location: [facility] ORS;  Service: Orthopedics;  Laterality: Left;  .  TUBAL LIGATION Bilateral   . VAGINAL HYSTERECTOMY  03/2006    There were no vitals filed for this visit.   Subjective Assessment - 03/09/20 1100    Subjective Patient reports doing well today, had a very busy weekend with her church but was able to tolerate short durations of social engagements prior to needing to rest. No falls or LOB since last session.    Pertinent History Patient is previous patient of this therapist, was admitted to the hospital on 08/03/19 for MS exacerbation and then went to inpatient rehab on 08/08/19. Patient then received home health therapy and now is returning to outpatient PT. PMh includes HTN, SVT, neurogenic bowel, MS (1995), neuropathy, hx of wrist fx, HPV, Hypercholesteremia, adiposity, osteopenia, spasticity. She wears bilateral AFOs and uses a RW and/or wheelchair for mobility. She drives an adapted car.    Limitations Lifting;Standing;Walking;House hold activities    How long can you sit comfortably? n/a    How long can you stand comfortably? 4 minutes    How long can you walk comfortably? walked 50 ft in inpatient rehab    Patient Stated Goals return to PLOF, increase walking, get into shower, increase mobility    Currently in Pain? No/denies             FOTO: 48.13%  5x sts: 23.71 one hand on walker one hand on  w/c.  10 mwt: 29.9 seconds with RW  TUG: 35.72  Patient will ambulate from physical therapy gym to elevator down the hall, ride elevator to first floor, and walk to valet with RW and chair follow for increased capacity for mobility and ind; able to walk to elevator with 3 rest breaks.   New goal: add single limb lift leg.   Treatment:  Pick up cone off side of 4" step at angle, return to top of step , 2x each UE; one hand on RW.  Seated french stretch for pectoral release and postural correction 10x 5 second holds     Pt educated throughout session about proper posture and technique with exercises. Improved exercise technique, movement  at target joints, use of target muscles after min to mod verbal, visual, tactile cues.  Seated rest breaks and use of ice pack utilized to keep body temperature at functional range to reduce MS exacerbation    Patient is progressing with functional goals, improving speed of transfers as well as control of eccentric return. Patient broke 30 seconds for first time for 10 MWT this session indicating improved ambulatory mechanics and velocity. Her ability to negotiate turns is improving additionally as can be seen in TUG progression. New goal of being able to lift LE for step negotiation will be added so patient can enter/exit sister's home. Continued focus on duration ambulation for negotiation of rehab and hospital. The patient would benefit from further skilled PT intervention to maximize QOL, safety, and functional mobility                     PT Education - 03/09/20 1012    Education provided Yes    Education Details goals, POC    Person(s) Educated Patient    Methods Explanation;Demonstration;Tactile cues;Verbal cues    Comprehension Verbalized understanding;Returned demonstration;Verbal cues required;Tactile cues required            PT Short Term Goals - 02/10/20 1110      PT SHORT TERM GOAL #1   Title Patient will be independent in home exercise program to improve strength/mobility for better functional independence with ADLs.    Baseline 5/19: HEP next session 6/22: HEP compliant 8/10 : HEP progressions compliant 8/31 HEP compliant 10/5: HEP compliant    Time 4    Period Weeks    Status Achieved    Target Date 01/13/20      PT SHORT TERM GOAL #2   Title Patient will perform a sit to stand with SUE support for increased ind. with transfers.    Baseline 5/19: heavy BUE support 6/22: requires one hand on walker one hand on w/c 8/10: able to perform intermittently (some days can some days cannot) 8/31: attempt next session 10/5: BUE reliance, patient unable to  complete 4 full stands. Attempt next session    Time 4    Period Weeks    Status Partially Met    Target Date 01/13/20             PT Long Term Goals - 03/09/20 0001      PT LONG TERM GOAL #1   Title Patient will increase FOTO score to equal to or greater than  53/100   to demonstrate statistically significant improvement in mobility and quality of life.     Baseline 8/31: 48.13% 7/27:  48.1% 6/22: 46.99% 5/18: 48/100 10/5: 49/100 11/2: 48%     Time 12    Period Weeks  Status Partially Met    Target Date 06/01/20      PT LONG TERM GOAL #2   Title Patient (< 65 years old) will complete five times sit to stand test in < 20 seconds indicating an increased LE strength and improved balance.    Baseline 7/27: 26.8 seconds with one partial stand 6/22: 40. 86 sec; unable to reach full stand last two attempts 5/19: 42 seconds 10/5: BUE reliance, patient unable to complete 4 full stands. Attempt next session 11/2: 23.71 seconds    Time 12    Status Partially Met    Target Date 06/01/20      PT LONG TERM GOAL #3   Title Patient will increase 10 meter walk test to <20 seconds as to improve gait speed for better community ambulation and to reduce fall risk.    Baseline  8/31: 35.1 seconds with RW 7/27: 36 seconds with RW 6/22: 36.1 seconds 5/19: 42 seconds 10/5: 31.9 seconds with RW 11/2: 29.9 seconds with RW     Time 12    Period Weeks    Status Partially Met    Target Date 06/01/20      PT LONG TERM GOAL #4   Title Patient will reduce timed up and go to <11 seconds to reduce fall risk and demonstrate improved transfer/gait ability.    Baseline  8/31: 50.8 seconds with RW 7/27: 43.65 6/22: 45.4 seconds 5/19: 47 seconds 10/5: 38.5 seconds with RW 11/2: 35.72 seconds with RW     Time 12    Period Weeks    Status Partially Met    Target Date 06/01/20      PT LONG TERM GOAL #5   Title Patient will ambulate from physical therapy gym to elevator down the hall, ride elevator to first  floor, and walk to valet with RW and chair follow for increased capacity for mobility and ind.    Baseline 8/31: able to open door ~50 ft with one rest break 8/10: unable to ambulate to elevator without rest break 9/30: able to ambulate to elevator with 3 rest breaks    Time 12    Period Weeks    Status Partially Met    Target Date 06/01/20      PT LONG TERM GOAL #6   Title Patient will be able to stand with RW and lift one leg onto 4" step for carryover to negotiation of steps at home.     Baseline 11/2: unable to perform     Time 12    Period Weeks    Status New    Target Date 06/01/20                 Plan - 03/09/20 1540    Clinical Impression Statement Patient is progressing with functional goals, improving speed of transfers as well as control of eccentric return. Patient broke 30 seconds for first time for 10 MWT this session indicating improved ambulatory mechanics and velocity. Her ability to negotiate turns is improving additionally as can be seen in TUG progression. New goal of being able to lift LE for step negotiation will be added so patient can enter/exit sister's home. Continued focus on duration ambulation for negotiation of rehab and hospital. The patient would benefit from further skilled PT intervention to maximize QOL, safety, and functional mobility    Personal Factors and Comorbidities Age;Comorbidity 3+;Fitness;Past/Current Experience;Sex;Time since onset of injury/illness/exacerbation    Comorbidities HTN, SVT, neurogenic bowel, MS (1995), neuropathy, hx of  wrist fx, HPV, Hypercholesteremia, adiposity, osteopenia, spasticity.    Examination-Activity Limitations Bathing;Bed Mobility;Bend;Carry;Continence;Dressing;Hygiene/Grooming;Stairs;Squat;Reach Overhead;Locomotion Level;Lift;Stand;Transfers    Examination-Participation Restrictions Church;Cleaning;Community Activity;Laundry;Volunteer;Shop;Meal Prep;Yard Work    [employer] officer  Evolving/Moderate complexity    Rehab Potential Good    PT Frequency 2x / week    PT Duration 12 weeks    PT Treatment/Interventions ADLs/Self Care Home Management;Aquatic Therapy;Biofeedback;Cryotherapy;Electrical Stimulation;Iontophoresis 67m/ml Dexamethasone;Traction;Moist Heat;Ultrasound;DME Instruction;Gait training;Stair training;Functional mobility training;Therapeutic activities;Therapeutic exercise;Balance training;Neuromuscular re-education;Patient/family education;Orthotic Fit/Training;Wheelchair mobility training;Manual techniques;Compression bandaging;Passive range of motion;Dry needling;Energy conservation;Taping;Splinting;Vestibular    PT Next Visit Plan step negotiation, turning directions to the L and R    PT Home Exercise Plan maintain current HEP from HHPT    Consulted and Agree with Plan of Care Patient           Patient will benefit from skilled therapeutic intervention in order to improve the following deficits and impairments:  Abnormal gait, Decreased activity tolerance, Decreased balance, Decreased coordination, Decreased endurance, Decreased mobility, Decreased range of motion, Decreased strength, Difficulty walking, Impaired perceived functional ability, Impaired sensation, Impaired tone, Impaired UE functional use, Improper body mechanics, Postural dysfunction, Cardiopulmonary status limiting activity, Impaired flexibility, Increased muscle spasms  Visit Diagnosis: Multiple sclerosis exacerbation ([facility])  Muscle weakness (generalized)  Other abnormalities of gait and mobility  Unsteadiness on feet     Problem List Patient Active Problem List   Diagnosis Date Noted  . Hypoalbuminemia due to protein-calorie malnutrition ([place]   . Neurogenic bowel   . Neurogenic bladder   . Labile blood pressure   . Neuropathic pain   . Abscess of female pelvis   . SVT (supraventricular tachycardia) ([place]   . Radial styloid tenosynovitis 03/12/2018  . Wheelchair  confinement 02/27/2018  . Localized osteoporosis with current pathological fracture with routine healing 01/19/2017  . Wrist fracture 01/16/2017  . Sprain of ankle 03/23/2016  . Closed fracture of lateral malleolus 03/16/2016  . Health care maintenance 01/24/2016  . Blood pressure elevated without history of HTN 10/25/2015  . Essential hypertension 10/25/2015  . Multiple sclerosis ([place] 10/02/2015  . Chronic left shoulder pain 07/19/2015  . Multiple sclerosis exacerbation ([place] 07/14/2015  . MS (multiple sclerosis) ([place] 11/26/2014  . Increased body mass index 11/26/2014  . HPV test positive 11/26/2014  . Status post laparoscopic supracervical hysterectomy 11/26/2014  . Galactorrhea 11/26/2014  . Back ache 05/21/2014  . Adiposity 05/21/2014  . Disordered sleep 05/21/2014  . Muscle spasticity 05/21/2014  . Spasticity 05/21/2014  . Calculus of kidney 12/09/2013  . Renal colic 045/85/9292 . Hypercholesteremia 08/19/2013  . Hereditary and idiopathic neuropathy 08/19/2013  . Hypercholesterolemia without hypertriglyceridemia 08/19/2013  . Bladder infection, chronic 07/25/2012  . Disorder of bladder function 07/25/2012  . Incomplete bladder emptying 07/25/2012  . Microscopic hematuria 07/25/2012  . Right upper quadrant pain 07/25/2012   MJanna Arch PT, DPT   03/09/2020, 3:41 PM  [facility] [address] [place] [address]Phone: [phone]  Fax:  [phone] Name: JAkacia BoltzRamseur MRN: [id number] of Birth: [birth year]

## 2020-03-09 NOTE — Telephone Encounter (Signed)
-----   Message from York Spaniel, MD sent at 03/09/2020 10:24 AM EDT ----- This patient is being set up for initiation of treatment with Mayzent.  Her genetic testing allows for a 2 mg dosing of the medication.  The patient needs an ophthalmology evaluation, she had an EKG done to the emergency room April 2021 but likely needs another study before we start.  Please contact her and make sure that she is getting her eye doctor evaluation and have her call her primary doctor to get an EKG.  The form for the Mayzent is on my desk, please initiate the start form and try to get coverage for the medication, the patient will be taking the 2 mg dose.

## 2020-03-09 NOTE — Telephone Encounter (Signed)
Called and spoke to patient.  Patient has an MRI set up for 11/11 and eye evaluation on 11/4.  She stated she would call her PCP for EKG and call me back with date.

## 2020-03-10 NOTE — Telephone Encounter (Signed)
It appears pt has scheduled her EKG for 11/4 at her PCP office.

## 2020-03-11 ENCOUNTER — Ambulatory Visit: Payer: PPO

## 2020-03-11 ENCOUNTER — Other Ambulatory Visit: Payer: Self-pay

## 2020-03-11 DIAGNOSIS — R2689 Other abnormalities of gait and mobility: Secondary | ICD-10-CM

## 2020-03-11 DIAGNOSIS — R2681 Unsteadiness on feet: Secondary | ICD-10-CM

## 2020-03-11 DIAGNOSIS — Z79899 Other long term (current) drug therapy: Secondary | ICD-10-CM | POA: Diagnosis not present

## 2020-03-11 DIAGNOSIS — G35 Multiple sclerosis: Secondary | ICD-10-CM | POA: Diagnosis not present

## 2020-03-11 DIAGNOSIS — M6281 Muscle weakness (generalized): Secondary | ICD-10-CM

## 2020-03-11 NOTE — Therapy (Signed)
[place] MAIN [facility] SERVICES [address] [place], [place], [address] Phone: [phone]   Fax:  [phone]  Physical Therapy Treatment  Patient Details  Name: Lisa Williams MRN: [id number] Date of Birth: [birth year] Referring Provider (PT): Gurney Maxin, MD   Encounter Date: 03/11/2020   PT End of Session - 03/12/20 0727    Visit Number 49    Number of Visits 72    Date for PT Re-Evaluation 06/01/20    Authorization Type 9/10 PN 02/10/20    PT Start Time 1015    PT Stop Time 1059    PT Time Calculation (min) 44 min    Equipment Utilized During Treatment Gait belt;Other (comment)   bilateral AFOs   Activity Tolerance Patient tolerated treatment well    Behavior During Therapy WFL for tasks assessed/performed           Past Medical History:  Diagnosis Date  . Abdominal pain, right upper quadrant   . Back pain   . Calculus of kidney 12/09/2013  . Chronic back pain    unspecified  . Chronic left shoulder pain 07/19/2015  . Functional disorder of bladder    other  . Galactorrhea 11/26/2014   Chronic   . Hereditary and idiopathic neuropathy 08/19/2013  . HPV test positive   . Hypercholesteremia 08/19/2013  . Incomplete bladder emptying   . Microscopic hematuria   . MS (multiple sclerosis) ([place])   . Muscle spasticity 05/21/2014  . Nonspecific findings on examination of urine    other  . Osteopenia   . Status post laparoscopic supracervical hysterectomy 11/26/2014  . Tobacco user 11/26/2014  . Wrist fracture     Past Surgical History:  Procedure Laterality Date  . bilateral tubal ligation  1996  . BREAST CYST EXCISION Left 2002  . KNEE SURGERY     right  . LAPAROSCOPIC SUPRACERVICAL HYSTERECTOMY  08/05/2013  . ORIF WRIST FRACTURE Left 01/17/2017   Procedure: OPEN REDUCTION INTERNAL FIXATION (ORIF) WRIST FRACTURE;  Surgeon: Lovell Sheehan, MD;  Location: [facility] ORS;  Service: Orthopedics;  Laterality: Left;  . TUBAL  LIGATION Bilateral   . VAGINAL HYSTERECTOMY  03/2006    There were no vitals filed for this visit.   Subjective Assessment - 03/12/20 0725    Subjective Patient reports having a good day, had to have an EEG earlier today so is a little tired. No falls or LOB since last session.    Pertinent History Patient is previous patient of this therapist, was admitted to the hospital on 08/03/19 for MS exacerbation and then went to inpatient rehab on 08/08/19. Patient then received home health therapy and now is returning to outpatient PT. PMh includes HTN, SVT, neurogenic bowel, MS (1995), neuropathy, hx of wrist fx, HPV, Hypercholesteremia, adiposity, osteopenia, spasticity. She wears bilateral AFOs and uses a RW and/or wheelchair for mobility. She drives an adapted car.    Limitations Lifting;Standing;Walking;House hold activities    How long can you sit comfortably? n/a    How long can you stand comfortably? 4 minutes    How long can you walk comfortably? walked 50 ft in inpatient rehab    Patient Stated Goals return to PLOF, increase walking, get into shower, increase mobility    Currently in Pain? No/denies             Standing in // bars:  6" step on side: bent over and  pick up 3 cones each UE and put back down  x 2 each side; seated rest break between switching sides 3lb weight pick up; very challenging, one near LOB forward,  Knee bends/ mini squat 5x with UE support and w/c behind, 2 sets PVC pipe chest press 10x, 2 sets  Seated: PVC pipe:  -chest press 15x cues for scapular retraction and depression -alternating row 15x each side -overhead alternate hand placement press 10x, cues for core contraction   isometric contraction against PT resistance: -hip flexion 10x 3 second hold -hip abduction 10x 3 second hold -hip adduction 10x 3 second holds     Patient utilizes ice pack for reduction of internal temperature to reduce risk of MS exacerbation and allow continuation of progression  of interventions.     Pt educated throughout session about proper posture and technique with exercises. Improved exercise technique, movement at target joints, use of target muscles after min to mod verbal, visual, tactile cues.                        PT Education - 03/12/20 0726    Education provided Yes    Education Details exercise technique, body mechanics, stabilization    Person(s) Educated Patient    Methods Explanation;Demonstration;Tactile cues;Verbal cues    Comprehension Verbalized understanding;Returned demonstration;Verbal cues required;Tactile cues required            PT Short Term Goals - 02/10/20 1110      PT SHORT TERM GOAL #1   Title Patient will be independent in home exercise program to improve strength/mobility for better functional independence with ADLs.    Baseline 5/19: HEP next session 6/22: HEP compliant 8/10 : HEP progressions compliant 8/31 HEP compliant 10/5: HEP compliant    Time 4    Period Weeks    Status Achieved    Target Date 01/13/20      PT SHORT TERM GOAL #2   Title Patient will perform a sit to stand with SUE support for increased ind. with transfers.    Baseline 5/19: heavy BUE support 6/22: requires one hand on walker one hand on w/c 8/10: able to perform intermittently (some days can some days cannot) 8/31: attempt next session 10/5: BUE reliance, patient unable to complete 4 full stands. Attempt next session    Time 4    Period Weeks    Status Partially Met    Target Date 01/13/20             PT Long Term Goals - 03/09/20 0001      PT LONG TERM GOAL #1   Title Patient will increase FOTO score to equal to or greater than  53/100   to demonstrate statistically significant improvement in mobility and quality of life.     Baseline 8/31: 48.13% 7/27:  48.1% 6/22: 46.99% 5/18: 48/100 10/5: 49/100 11/2: 48%     Time 12    Period Weeks    Status Partially Met    Target Date 06/01/20      PT LONG TERM GOAL #2    Title Patient (< 13 years old) will complete five times sit to stand test in < 20 seconds indicating an increased LE strength and improved balance.    Baseline 7/27: 26.8 seconds with one partial stand 6/22: 40. 86 sec; unable to reach full stand last two attempts 5/19: 42 seconds 10/5: BUE reliance, patient unable to complete 4 full stands. Attempt next session 11/2: 23.71 seconds    Time 12    Status  Partially Met    Target Date 06/01/20      PT LONG TERM GOAL #3   Title Patient will increase 10 meter walk test to <20 seconds as to improve gait speed for better community ambulation and to reduce fall risk.    Baseline  8/31: 35.1 seconds with RW 7/27: 36 seconds with RW 6/22: 36.1 seconds 5/19: 42 seconds 10/5: 31.9 seconds with RW 11/2: 29.9 seconds with RW     Time 12    Period Weeks    Status Partially Met    Target Date 06/01/20      PT LONG TERM GOAL #4   Title Patient will reduce timed up and go to <11 seconds to reduce fall risk and demonstrate improved transfer/gait ability.    Baseline  8/31: 50.8 seconds with RW 7/27: 43.65 6/22: 45.4 seconds 5/19: 47 seconds 10/5: 38.5 seconds with RW 11/2: 35.72 seconds with RW     Time 12    Period Weeks    Status Partially Met    Target Date 06/01/20      PT LONG TERM GOAL #5   Title Patient will ambulate from physical therapy gym to elevator down the hall, ride elevator to first floor, and walk to valet with RW and chair follow for increased capacity for mobility and ind.    Baseline 8/31: able to open door ~50 ft with one rest break 8/10: unable to ambulate to elevator without rest break 9/30: able to ambulate to elevator with 3 rest breaks    Time 12    Period Weeks    Status Partially Met    Target Date 06/01/20      PT LONG TERM GOAL #6   Title Patient will be able to stand with RW and lift one leg onto 4" step for carryover to negotiation of steps at home.     Baseline 11/2: unable to perform     Time 12    Period Weeks     Status New    Target Date 06/01/20                 Plan - 03/12/20 7616    Clinical Impression Statement Patient continues to present with excellent motivation throughout session. Prolonged standing tolerance combined with increasing stabilization in standing allows for progression of interventions. Patient is able to tolerate hip and knee movements to break chain of extension in standing this session. The patient would benefit from further skilled PT intervention to maximize QOL, safety, and functional mobility    Personal Factors and Comorbidities Age;Comorbidity 3+;Fitness;Past/Current Experience;Sex;Time since onset of injury/illness/exacerbation    Comorbidities HTN, SVT, neurogenic bowel, MS (1995), neuropathy, hx of wrist fx, HPV, Hypercholesteremia, adiposity, osteopenia, spasticity.    Examination-Activity Limitations Bathing;Bed Mobility;Bend;Carry;Continence;Dressing;Hygiene/Grooming;Stairs;Squat;Reach Overhead;Locomotion Level;Lift;Stand;Transfers    Examination-Participation Restrictions Church;Cleaning;Community Activity;Laundry;Volunteer;Shop;Meal Prep;Yard Work    [employer] Evolving/Moderate complexity    Rehab Potential Good    PT Frequency 2x / week    PT Duration 12 weeks    PT Treatment/Interventions ADLs/Self Care Home Management;Aquatic Therapy;Biofeedback;Cryotherapy;Electrical Stimulation;Iontophoresis 78m/ml Dexamethasone;Traction;Moist Heat;Ultrasound;DME Instruction;Gait training;Stair training;Functional mobility training;Therapeutic activities;Therapeutic exercise;Balance training;Neuromuscular re-education;Patient/family education;Orthotic Fit/Training;Wheelchair mobility training;Manual techniques;Compression bandaging;Passive range of motion;Dry needling;Energy conservation;Taping;Splinting;Vestibular    PT Next Visit Plan step negotiation, turning directions to the L and R    PT Home Exercise Plan maintain current HEP from HHPT     Consulted and Agree with Plan of Care Patient  Patient will benefit from skilled therapeutic intervention in order to improve the following deficits and impairments:  Abnormal gait, Decreased activity tolerance, Decreased balance, Decreased coordination, Decreased endurance, Decreased mobility, Decreased range of motion, Decreased strength, Difficulty walking, Impaired perceived functional ability, Impaired sensation, Impaired tone, Impaired UE functional use, Improper body mechanics, Postural dysfunction, Cardiopulmonary status limiting activity, Impaired flexibility, Increased muscle spasms  Visit Diagnosis: Multiple sclerosis exacerbation (HCC)  Muscle weakness (generalized)  Other abnormalities of gait and mobility  Unsteadiness on feet     Problem List Patient Active Problem List   Diagnosis Date Noted  . Hypoalbuminemia due to protein-calorie malnutrition ([place])   . Neurogenic bowel   . Neurogenic bladder   . Labile blood pressure   . Neuropathic pain   . Abscess of female pelvis   . SVT (supraventricular tachycardia) ([place])   . Radial styloid tenosynovitis 03/12/2018  . Wheelchair confinement 02/27/2018  . Localized osteoporosis with current pathological fracture with routine healing 01/19/2017  . Wrist fracture 01/16/2017  . Sprain of ankle 03/23/2016  . Closed fracture of lateral malleolus 03/16/2016  . Health care maintenance 01/24/2016  . Blood pressure elevated without history of HTN 10/25/2015  . Essential hypertension 10/25/2015  . Multiple sclerosis ([place]) 10/02/2015  . Chronic left shoulder pain 07/19/2015  . Multiple sclerosis exacerbation ([place]) 07/14/2015  . MS (multiple sclerosis) ([place]) 11/26/2014  . Increased body mass index 11/26/2014  . HPV test positive 11/26/2014  . Status post laparoscopic supracervical hysterectomy 11/26/2014  . Galactorrhea 11/26/2014  . Back ache 05/21/2014  . Adiposity 05/21/2014  . Disordered sleep 05/21/2014  .  Muscle spasticity 05/21/2014  . Spasticity 05/21/2014  . Calculus of kidney 12/09/2013  . Renal colic 11/26/5748  . Hypercholesteremia 08/19/2013  . Hereditary and idiopathic neuropathy 08/19/2013  . Hypercholesterolemia without hypertriglyceridemia 08/19/2013  . Bladder infection, chronic 07/25/2012  . Disorder of bladder function 07/25/2012  . Incomplete bladder emptying 07/25/2012  . Microscopic hematuria 07/25/2012  . Right upper quadrant pain 07/25/2012   Janna Arch, PT, DPT   03/12/2020, 7:30 AM  [place] MAIN [facility] SERVICES [place], [place], [address] Phone: [phone]   Fax:  [phone]  Name: Melrose Kearse Shakoor MRN: [id number] Date of Birth: Jul 17, 1960

## 2020-03-15 ENCOUNTER — Other Ambulatory Visit
Admission: RE | Admit: 2020-03-15 | Discharge: 2020-03-15 | Disposition: A | Discharge status: Home / Self Care | Payer: PPO | Source: Ambulatory Visit | Attending: Neurology | Admitting: Neurology

## 2020-03-15 ENCOUNTER — Other Ambulatory Visit: Payer: Self-pay

## 2020-03-15 DIAGNOSIS — Z20822 Contact with and (suspected) exposure to covid-19: Secondary | ICD-10-CM | POA: Insufficient documentation

## 2020-03-15 DIAGNOSIS — Z01818 Encounter for other preprocedural examination: Secondary | ICD-10-CM | POA: Insufficient documentation

## 2020-03-15 DIAGNOSIS — K1370 Unspecified lesions of oral mucosa: Secondary | ICD-10-CM | POA: Diagnosis not present

## 2020-03-15 LAB — SARS CORONAVIRUS 2 (TAT 6-24 HRS): SARS Coronavirus 2: NEGATIVE

## 2020-03-15 NOTE — Telephone Encounter (Signed)
Pt returned phone call, eye appt was changed to December 9. Pt stated, will contact you as soon as I go to my eye appt.

## 2020-03-15 NOTE — Telephone Encounter (Signed)
Received EKG report from Dr. Ewell Poe office dated 11/4/201.  I have yet to receive eye exam results for Lisa Williams. I called pt to ask which office she has seen so that I may request results.  I called pt to discuss. No answer, left a message asking her to call back. If pt calls back please discuss this with her and let Lisa Williams know.

## 2020-03-16 ENCOUNTER — Ambulatory Visit: Payer: PPO

## 2020-03-17 NOTE — Progress Notes (Signed)
LM for patient to remain NPO after MN for solids may have clear liquids until 5am, take Baclofen and Ditropan in am with sips, may take Tylenol prn. Please arrive at 0545am and report to the admitting department. You will need to have someone present to drive you home and present 24 hours after anesthesia. Instructed to call [phone] is she has any questions.

## 2020-03-17 NOTE — Telephone Encounter (Signed)
Received this notice from Marcelino Duster, rep with [employer]: "If you need assistance scheduling services we can assist with that as well.  We can schedule those for all patients (we assist with all patients  currently/regardless of coverage due to the covid pandemic). You can send those on over and our nurse will schedule."  I spoke with Dr. Anne Hahn. He is agreeable to the ME screening being completed thru [employer] if possible.  I called pt and discussed this with her. She is agreeable to having [employer] assist with the ME screening. She will also keep her annual eye exam scheduled in February.  Faxed [employer] start form. Received a receipt of confirmation.

## 2020-03-17 NOTE — Progress Notes (Signed)
Anesthesia Chart Review: Lisa Williams   Case: [id number] Date/Time: 03/18/20 0800   Procedure: MRI WITH ANESTHESIA CERVICAL SPINE AND BRAIN  WITH AND WITHOUT CONTRAST (N/A )   Anesthesia type: General   Pre-op diagnosis: M.S   [facility]: [facility] RAD[facility]OLOGY [facility] / [facility] OR   Surgeons: Radiologist, Medication, MD      DISCUSSION: Patient is a 59 year old female scheduled for MRI under anesthesia due to claustrophobia. MRI was ordered by neurologist Dr. Anne Hahn, last visit 03/01/20 (H&P).   History includes former smoker, Multiple Sclerosis (diagnosed 43; hospitalized 08/2019 with exacerbation), hypercholesterolemia, incomplete bladder emptying. SVT/atrial tachycardia in setting of MS flare 04/2018 (saw cardiology; consider EP referral if recurrent symptoms).   According to Dr. Anne Hahn' 03/01/20 note, she is currently on rituximab for MS but does not wish to continue this medication. [employer] will be considered. Brain and c-spine MRI ordered.    Presurgical COVID-19 test negative on 03/15/20. Anesthesia team to evaluate on the day of procedure.     VS:  BP Readings from Last 3 Encounters:  03/01/20 (!) 156/86  08/19/19 105/65  08/08/19 140/66   Pulse Readings from Last 3 Encounters:  03/01/20 78  08/19/19 (!) 57  08/08/19 (!) 46    PROVIDERS: Lauro Regulus, MD is PCP  York Spaniel, MD is neurologist - She was evaluated by cardiologist End, Cristal Deer, MD on 04/17/18 for tachycardia, likely SVT "favorable atrial tachycardia" (HR up to 180 bpm) believed to be brought on by stress related to MS flare (rhythm terminated with IV metoprolol and diltiazem). He wrote, "I  do not believe this represents atrial fibrillation or flutter. Therefore, anticoagulation is not indicated." Echo showed LVH with no significant structural abnormalities. No further cardiac work-up or intervention recommended at that time; however, if recurrent symptoms could consider EP referral.   LABS: Last lab  results include: Lab Results  Component Value Date   WBC 6.9 03/01/2020   HGB 13.1 03/01/2020   HCT 40.6 03/01/2020   PLT 227 03/01/2020   GLUCOSE 89 03/01/2020   ALT 16 03/01/2020   AST 15 03/01/2020   NA 144 03/01/2020   K 4.0 03/01/2020   CL 107 (H) 03/01/2020   CREATININE 0.64 03/01/2020   BUN 22 03/01/2020   CO2 23 03/01/2020    EKG: 08/03/19: Normal sinus rhythm Possible Left atrial enlargement Minimal voltage criteria for LVH, may be normal variant ( R in aVL ) Cannot rule out Anterior infarct, age undetermined   CV: Echo 04/17/18: Study Conclusions  - Left ventricle: The cavity size was normal. Wall thickness was  increased increased in a pattern of mild to moderate LVH.  Systolic function was normal. The estimated ejection fraction was  in the range of 60% to 65%. Wall motion was normal; there were no  regional wall motion abnormalities. Doppler parameters are  consistent with abnormal left ventricular relaxation (grade 1  diastolic dysfunction).  - Right ventricle: The cavity size was normal. Systolic function  was normal.   Nuclear stress test 02/21/13: Summary   1. No significant wall motion abnormality noted.   2. Pharmacological myocardial perfusion study with no significant  ischemia.   3. The estimated ejection fraction is 65%.   4. There are no EKG changes concerning for ischemia.   5. There is no artifact noted on this study.    Past Medical History:  Diagnosis Date  . Abdominal pain, right upper quadrant   . Back pain   . Calculus of  kidney 12/09/2013  . Chronic back pain    unspecified  . Chronic left shoulder pain 07/19/2015  . Functional disorder of bladder    other  . Galactorrhea 11/26/2014   Chronic   . Hereditary and idiopathic neuropathy 08/19/2013  . HPV test positive   . Hypercholesteremia 08/19/2013  . Incomplete bladder emptying   . Microscopic hematuria   . MS (multiple sclerosis) (HCC)   . Muscle spasticity  05/21/2014  . Nonspecific findings on examination of urine    other  . Osteopenia   . Status post laparoscopic supracervical hysterectomy 11/26/2014  . Tobacco user 11/26/2014  . Wrist fracture     Past Surgical History:  Procedure Laterality Date  . bilateral tubal ligation  1996  . BREAST CYST EXCISION Left 2002  . KNEE SURGERY     right  . LAPAROSCOPIC SUPRACERVICAL HYSTERECTOMY  08/05/2013  . ORIF WRIST FRACTURE Left 01/17/2017   Procedure: OPEN REDUCTION INTERNAL FIXATION (ORIF) WRIST FRACTURE;  Surgeon: Lyndle Herrlich, MD;  Location: [facility] ORS;  Service: Orthopedics;  Laterality: Left;  . TUBAL LIGATION Bilateral   . VAGINAL HYSTERECTOMY  03/2006    MEDICATIONS: No current facility-administered medications for this encounter.   Marland Kitchen acetaminophen (TYLENOL) 500 MG tablet  . baclofen (LIORESAL) 20 MG tablet  . Cholecalciferol (EQL VITAMIN D3) 25 MCG (1000 UT) tablet  . gabapentin (NEURONTIN) 100 MG capsule  . ibandronate (BONIVA) 150 MG tablet  . oxybutynin (DITROPAN-XL) 10 MG 24 hr tablet  . telmisartan (MICARDIS) 40 MG tablet  . famotidine (PEPCID) 20 MG tablet  . Multiple Vitamin (MULTIVITAMIN WITH MINERALS) TABS tablet    Shonna Chock, PA-C Surgical Short Stay/Anesthesiology [facility] Phone [phone] [facility] Phone [phone] 03/17/2020 10:54 AM

## 2020-03-17 NOTE — Anesthesia Preprocedure Evaluation (Addendum)
Anesthesia Evaluation  Patient identified by MRN, date of birth, ID band Patient awake    Reviewed: Allergy & Precautions, NPO status , Patient's Chart, lab work & pertinent test results  History of Anesthesia Complications Negative for: history of anesthetic complications  Airway Mallampati: II  TM Distance: >3 FB Neck ROM: Full    Dental  (+) Teeth Intact   Pulmonary neg pulmonary ROS, former smoker,    Pulmonary exam normal        Cardiovascular hypertension, Normal cardiovascular exam+ dysrhythmias Supra Ventricular Tachycardia      Neuro/Psych MS negative psych ROS   GI/Hepatic negative GI ROS, Neg liver ROS,   Endo/Other  negative endocrine ROS  Renal/GU negative Renal ROS  negative genitourinary   Musculoskeletal negative musculoskeletal ROS (+)   Abdominal   Peds  Hematology negative hematology ROS (+)   Anesthesia Other Findings  Echo 04/17/18: Study Conclusions  - Left ventricle: The cavity size was normal. Wall thickness was  increased increased in a pattern of mild to moderate LVH.  Systolic function was normal. The estimated ejection fraction was  in the range of 60% to 65%. Wall motion was normal; there were no  regional wall motion abnormalities. Doppler parameters are  consistent with abnormal left ventricular relaxation (grade 1  diastolic dysfunction).  - Right ventricle: The cavity size was normal. Systolic function  was normal.   Nuclear stress test 02/21/13: Summary  1. No significant wall motion abnormality noted.  2. Pharmacological myocardial perfusion study with no significant  ischemia.  3. The estimated ejection fraction is 65%.  4. There are no EKG changes concerning for ischemia.  5. There is no artifact noted on this study.   Reproductive/Obstetrics                           Anesthesia Physical Anesthesia Plan  ASA:  III  Anesthesia Plan: General   Post-op Pain Management:    Induction: Intravenous  PONV Risk Score and Plan: 3 and Ondansetron, Dexamethasone, Midazolam and Treatment may vary due to age or medical condition  Airway Management Planned: LMA and Oral ETT  Additional Equipment: None  Intra-op Plan:   Post-operative Plan: Extubation in OR  Informed Consent: I have reviewed the patients History and Physical, chart, labs and discussed the procedure including the risks, benefits and alternatives for the proposed anesthesia with the patient or authorized representative who has indicated his/her understanding and acceptance.     Dental advisory given  Plan Discussed with:   Anesthesia Plan Comments: (PAT note written 03/17/2020 by Shonna Chock, PA-C. )      Anesthesia Quick Evaluation

## 2020-03-17 NOTE — Telephone Encounter (Signed)
Dr. Anne Hahn reviewed EKG from 03/11/2020 and deemed it acceptable. Still waiting on ME screening.

## 2020-03-18 ENCOUNTER — Encounter (HOSPITAL_COMMUNITY): Payer: Self-pay | Admitting: Neurology

## 2020-03-18 ENCOUNTER — Ambulatory Visit (HOSPITAL_COMMUNITY): Payer: PPO | Admitting: Vascular Surgery

## 2020-03-18 ENCOUNTER — Ambulatory Visit: Payer: PPO

## 2020-03-18 ENCOUNTER — Ambulatory Visit (HOSPITAL_COMMUNITY): Admission: RE | Admit: 2020-03-18 | Payer: PPO | Source: Ambulatory Visit

## 2020-03-18 ENCOUNTER — Encounter (HOSPITAL_COMMUNITY)
Admission: RE | Disposition: A | Discharge status: Home / Self Care | Payer: Self-pay | Source: Ambulatory Visit | Attending: Neurology

## 2020-03-18 ENCOUNTER — Ambulatory Visit (HOSPITAL_COMMUNITY)
Admission: RE | Admit: 2020-03-18 | Discharge: 2020-03-18 | Disposition: A | Discharge status: Home / Self Care | Payer: PPO | Source: Ambulatory Visit | Attending: Neurology | Admitting: Neurology

## 2020-03-18 ENCOUNTER — Encounter (HOSPITAL_COMMUNITY): Payer: Self-pay

## 2020-03-18 ENCOUNTER — Telehealth: Payer: Self-pay | Admitting: Neurology

## 2020-03-18 DIAGNOSIS — R9082 White matter disease, unspecified: Secondary | ICD-10-CM | POA: Diagnosis not present

## 2020-03-18 DIAGNOSIS — F40240 Claustrophobia: Secondary | ICD-10-CM | POA: Diagnosis not present

## 2020-03-18 DIAGNOSIS — Z87891 Personal history of nicotine dependence: Secondary | ICD-10-CM | POA: Diagnosis not present

## 2020-03-18 DIAGNOSIS — Z79899 Other long term (current) drug therapy: Secondary | ICD-10-CM | POA: Diagnosis not present

## 2020-03-18 DIAGNOSIS — G9389 Other specified disorders of brain: Secondary | ICD-10-CM | POA: Diagnosis not present

## 2020-03-18 DIAGNOSIS — G35 Multiple sclerosis: Secondary | ICD-10-CM | POA: Insufficient documentation

## 2020-03-18 DIAGNOSIS — R531 Weakness: Secondary | ICD-10-CM | POA: Diagnosis not present

## 2020-03-18 DIAGNOSIS — I10 Essential (primary) hypertension: Secondary | ICD-10-CM | POA: Diagnosis not present

## 2020-03-18 DIAGNOSIS — M4802 Spinal stenosis, cervical region: Secondary | ICD-10-CM | POA: Diagnosis not present

## 2020-03-18 DIAGNOSIS — G8220 Paraplegia, unspecified: Secondary | ICD-10-CM | POA: Diagnosis not present

## 2020-03-18 DIAGNOSIS — E7800 Pure hypercholesterolemia, unspecified: Secondary | ICD-10-CM | POA: Diagnosis not present

## 2020-03-18 DIAGNOSIS — Z7983 Long term (current) use of bisphosphonates: Secondary | ICD-10-CM | POA: Insufficient documentation

## 2020-03-18 DIAGNOSIS — I471 Supraventricular tachycardia: Secondary | ICD-10-CM | POA: Diagnosis not present

## 2020-03-18 DIAGNOSIS — M50222 Other cervical disc displacement at C5-C6 level: Secondary | ICD-10-CM | POA: Diagnosis not present

## 2020-03-18 DIAGNOSIS — G9589 Other specified diseases of spinal cord: Secondary | ICD-10-CM | POA: Diagnosis not present

## 2020-03-18 HISTORY — PX: RADIOLOGY WITH ANESTHESIA: SHX6223

## 2020-03-18 HISTORY — DX: Other specified postprocedural states: Z98.890

## 2020-03-18 HISTORY — DX: Essential (primary) hypertension: I10

## 2020-03-18 HISTORY — DX: Other complications of anesthesia, initial encounter: T88.59XA

## 2020-03-18 HISTORY — DX: Other specified postprocedural states: R11.2

## 2020-03-18 LAB — BASIC METABOLIC PANEL
Anion gap: 6 (ref 5–15)
BUN: 20 mg/dL (ref 6–20)
CO2: 26 mmol/L (ref 22–32)
Calcium: 9 mg/dL (ref 8.9–10.3)
Chloride: 109 mmol/L (ref 98–111)
Creatinine, Ser: 0.78 mg/dL (ref 0.44–1.00)
GFR, Estimated: >60 mL/min (ref >60)
Glucose, Bld: 96 mg/dL (ref 70–99)
Potassium: 4.1 mmol/L (ref 3.5–5.1)
Sodium: 141 mmol/L (ref 135–145)

## 2020-03-18 SURGERY — MRI WITH ANESTHESIA
Anesthesia: General

## 2020-03-18 MED ORDER — ONDANSETRON HCL 4 MG/2ML IJ SOLN: 4.0000 mg | Freq: Once | INTRAMUSCULAR | Status: DC | PRN

## 2020-03-18 MED ORDER — SUCCINYLCHOLINE CHLORIDE 200 MG/10ML IV SOSY
PREFILLED_SYRINGE | INTRAVENOUS | Status: DC | PRN
  Administered 2020-03-18: 140 mg via INTRAVENOUS

## 2020-03-18 MED ORDER — PHENYLEPHRINE HCL-NACL 10-0.9 MG/250ML-% IV SOLN
INTRAVENOUS | Status: DC | PRN
  Administered 2020-03-18: 20 ug/min via INTRAVENOUS

## 2020-03-18 MED ORDER — FENTANYL CITRATE (PF) 100 MCG/2ML IJ SOLN
INTRAMUSCULAR | Status: DC | PRN
  Administered 2020-03-18: 100 ug via INTRAVENOUS

## 2020-03-18 MED ORDER — LIDOCAINE 2% (20 MG/ML) 5 ML SYRINGE
INTRAMUSCULAR | Status: DC | PRN
  Administered 2020-03-18: 100 mg via INTRAVENOUS

## 2020-03-18 MED ORDER — DEXAMETHASONE SODIUM PHOSPHATE 10 MG/ML IJ SOLN
INTRAMUSCULAR | Status: DC | PRN
  Administered 2020-03-18: 10 mg via INTRAVENOUS

## 2020-03-18 MED ORDER — GADOBUTROL 1 MMOL/ML IV SOLN
10.0000 mL | Freq: Once | INTRAVENOUS | Status: AC | PRN
  Administered 2020-03-18: 10 mL via INTRAVENOUS

## 2020-03-18 MED ORDER — CHLORHEXIDINE GLUCONATE 0.12 % MT SOLN
15.0000 mL | Freq: Once | OROMUCOSAL | Status: AC
  Administered 2020-03-18: 15 mL via OROMUCOSAL
  Filled 2020-03-18: qty 15

## 2020-03-18 MED ORDER — ORAL CARE MOUTH RINSE: 15.0000 mL | Freq: Once | OROMUCOSAL | Status: AC

## 2020-03-18 MED ORDER — LACTATED RINGERS IV SOLN: INTRAVENOUS | Status: DC

## 2020-03-18 MED ORDER — ONDANSETRON HCL 4 MG/2ML IJ SOLN
INTRAMUSCULAR | Status: DC | PRN
  Administered 2020-03-18: 4 mg via INTRAVENOUS

## 2020-03-18 MED ORDER — PROPOFOL 10 MG/ML IV BOLUS
INTRAVENOUS | Status: DC | PRN
  Administered 2020-03-18: 200 mg via INTRAVENOUS

## 2020-03-18 MED ORDER — AMISULPRIDE (ANTIEMETIC) 5 MG/2ML IV SOLN: 10.0000 mg | Freq: Once | INTRAVENOUS | Status: DC | PRN

## 2020-03-18 NOTE — Anesthesia Procedure Notes (Addendum)
Procedure Name: Intubation Date/Time: 03/18/2020 8:50 AM Performed by: Cleda Daub, CRNA Pre-anesthesia Checklist: Patient identified, Emergency Drugs available, Suction available and Patient being monitored Patient Re-evaluated:Patient Re-evaluated prior to induction Oxygen Delivery Method: Circle system utilized Preoxygenation: Pre-oxygenation with 100% oxygen Induction Type: IV induction Laryngoscope Size: Mac and 3 Grade View: Grade I Tube type: Oral Tube size: 7.0 mm Number of attempts: 1 Airway Equipment and Method: Stylet and Oral airway Placement Confirmation: ETT inserted through vocal cords under direct vision,  positive ETCO2 and breath sounds checked- equal and bilateral Secured at: 22 cm Tube secured with: Tape Dental Injury: Teeth and Oropharynx as per pre-operative assessment

## 2020-03-18 NOTE — H&P (Signed)
Please refer to full history and physical examination done on 01 March 2020.

## 2020-03-18 NOTE — Telephone Encounter (Signed)
I called the patient.  The patient does have changes in the brain consistent with MS, patchy involvement of the spinal cord.  At the C6-7 level there appears to be some impingement of the right side of the spinal cord with spinal stenosis, no encephalomalacia seen from this.  The spinal cord lesions appear to be mainly from multiple sclerosis.  We will need to follow this issue over time, I am not clear that this spinal stenosis is causing any symptoms currently.  The patient is being switched over to [facility].   MRI brain 03/18/20:  IMPRESSION: Scattered foci of abnormal T2 and FLAIR signal within the cerebral hemispheric white matter, 10-20 foci in each hemisphere. Some cortical and subcortical involvement particularly at the right parietal vertex. The findings are consistent with chronic multiple sclerosis. No lesions show restricted diffusion or contrast Enhancement.   MRI cervical 03/18/20:  IMPRESSION: 1. Multiple foci of abnormal T2 and FLAIR signal affecting the cervical and upper thoracic spinal cord consistent with demyelinating disease. No areas show abnormal contrast enhancement. See above for more detailed description of cord lesions. 2. C6-7: Central to right-sided disc herniation. Spinal stenosis to the right of midline with AP diameter only 5.3 mm. Effacement of the subarachnoid space and some cord deformity on the right. Disc material also extends into the proximal foramen on the left. Foraminal stenosis left worse than right could affect the exiting C7 nerves. 3. C5-6: Central to left-sided disc herniation. No cord compression. Moderate left foraminal narrowing.

## 2020-03-18 NOTE — Transfer of Care (Signed)
Immediate Anesthesia Transfer of Care Note  Patient: Lisa Williams  Procedure(s) Performed: MRI WITH ANESTHESIA CERVICAL SPINE AND BRAIN  WITH AND WITHOUT CONTRAST (N/A )  Patient Location: PACU  Anesthesia Type:General  Level of Consciousness: awake, alert , oriented and patient cooperative  Airway & Oxygen Therapy: Patient Spontanous Breathing and Patient connected to face mask oxygen  Post-op Assessment: Report given to RN and Post -op Vital signs reviewed and stable  Post vital signs: Reviewed and stable  Last Vitals:  Vitals Value Taken Time  BP 167/97 03/18/20 1020  Temp 36.8 C 03/18/20 1020  Pulse 87 03/18/20 1025  Resp 17 03/18/20 1025  SpO2 97 % 03/18/20 1025  Vitals shown include unvalidated device data.  Last Pain:  Vitals:   03/18/20 1020  TempSrc:   PainSc: 0-No pain         Complications: No complications documented.

## 2020-03-18 NOTE — Anesthesia Postprocedure Evaluation (Signed)
Anesthesia Post Note  Patient: Lisa Williams  Procedure(s) Performed: MRI WITH ANESTHESIA CERVICAL SPINE AND BRAIN  WITH AND WITHOUT CONTRAST (N/A )     Patient location during evaluation: PACU Anesthesia Type: General Level of consciousness: awake and alert Pain management: pain level controlled Vital Signs Assessment: post-procedure vital signs reviewed and stable Respiratory status: spontaneous breathing, nonlabored ventilation and respiratory function stable Cardiovascular status: blood pressure returned to baseline and stable Postop Assessment: no apparent nausea or vomiting Anesthetic complications: no   No complications documented.  Last Vitals:  Vitals:   03/18/20 1035 03/18/20 1050  BP: (!) 154/82 (!) 150/94  Pulse: 85 87  Resp: 15 18  Temp:  36.6 C  SpO2: 95% 96%    Last Pain:  Vitals:   03/18/20 1050  TempSrc:   PainSc: 0-No pain                 Lucretia Kern

## 2020-03-19 ENCOUNTER — Other Ambulatory Visit: Payer: Self-pay | Admitting: Internal Medicine

## 2020-03-19 ENCOUNTER — Encounter (HOSPITAL_COMMUNITY): Payer: Self-pay | Admitting: Radiology

## 2020-03-19 DIAGNOSIS — Z1231 Encounter for screening mammogram for malignant neoplasm of breast: Secondary | ICD-10-CM

## 2020-03-22 NOTE — Telephone Encounter (Signed)
PA for [facility] completed on Cover My meds. (Key: [id number])  This request has received a favorable outcome and is approved.  Enrollment Form  BELDDRLM  Complete  Primary Prior Authorization [id number] Approved  PA Case: [id number], Status: Approved, Coverage Starts on: 03/22/2020 12:00:00 AM, Coverage Ends on: 03/22/2021 12:00:00 AM.

## 2020-03-23 ENCOUNTER — Other Ambulatory Visit: Payer: Self-pay

## 2020-03-23 ENCOUNTER — Ambulatory Visit: Payer: PPO

## 2020-03-23 DIAGNOSIS — R2681 Unsteadiness on feet: Secondary | ICD-10-CM

## 2020-03-23 DIAGNOSIS — G35 Multiple sclerosis: Secondary | ICD-10-CM | POA: Diagnosis not present

## 2020-03-23 DIAGNOSIS — M6281 Muscle weakness (generalized): Secondary | ICD-10-CM

## 2020-03-23 DIAGNOSIS — R2689 Other abnormalities of gait and mobility: Secondary | ICD-10-CM

## 2020-03-23 NOTE — Telephone Encounter (Signed)
Pt's ME screening scheduled tentatively for 03/29/2020.

## 2020-03-23 NOTE — Therapy (Signed)
[place] MAIN [facility] SERVICES [address] [place], [place], [address] Phone: [phone]   Fax:  [phone]  Physical Therapy Treatment Physical Therapy Progress Note   Dates of reporting period  02/10/20   to   03/23/20  Patient Details  Name: Lisa Williams MRN: [id number] Date of Birth: 01-31-1961 Referring Provider (PT): Gurney Maxin, MD   Encounter Date: 03/23/2020   PT End of Session - 03/23/20 1012    Visit Number 50    Number of Visits 29    Date for PT Re-Evaluation 06/01/20    Authorization Type 10/10 PN 02/10/20; next session 1/10 PN 03/23/20    PT Start Time 1014    PT Stop Time 1059    PT Time Calculation (min) 45 min    Equipment Utilized During Treatment Gait belt;Other (comment)   bilateral AFOs   Activity Tolerance Patient tolerated treatment well    Behavior During Therapy [facility] for tasks assessed/performed           Past Medical History:  Diagnosis Date   Abdominal pain, right upper quadrant    Back pain    Calculus of kidney 12/09/2013   Chronic back pain    unspecified   Chronic left shoulder pain 7/68/0881   Complication of anesthesia    Functional disorder of bladder    other   Galactorrhea 11/26/2014   Chronic    Hereditary and idiopathic neuropathy 08/19/2013   HPV test positive    Hypercholesteremia 08/19/2013   Hypertension    Incomplete bladder emptying    Microscopic hematuria    MS (multiple sclerosis) (HCC)    Muscle spasticity 05/21/2014   Nonspecific findings on examination of urine    other   Osteopenia    PONV (postoperative nausea and vomiting)    Status post laparoscopic supracervical hysterectomy 11/26/2014   Tobacco user 11/26/2014   Wrist fracture     Past Surgical History:  Procedure Laterality Date   bilateral tubal ligation  1996   BREAST CYST EXCISION Left 2002   FRACTURE SURGERY     KNEE SURGERY     right   LAPAROSCOPIC SUPRACERVICAL  HYSTERECTOMY  08/05/2013   ORIF WRIST FRACTURE Left 01/17/2017   Procedure: OPEN REDUCTION INTERNAL FIXATION (ORIF) WRIST FRACTURE;  Surgeon: Lovell Sheehan, MD;  Location: [facility];  Service: Orthopedics;  Laterality: Left;   RADIOLOGY WITH ANESTHESIA N/A 03/18/2020   Procedure: MRI WITH ANESTHESIA CERVICAL SPINE AND BRAIN  WITH AND WITHOUT CONTRAST;  Surgeon: Radiologist, Medication, MD;  Location: [place];  Service: Radiology;  Laterality: N/A;   TUBAL LIGATION Bilateral    VAGINAL HYSTERECTOMY  03/2006    There were no vitals filed for this visit.   Subjective Assessment - 03/23/20 1711    Subjective Patient reports having some low back pain today. Missed last week due to dental procedure and MRI. No falls or LOB since last session.    Pertinent History Patient is previous patient of this therapist, was admitted to the hospital on 08/03/19 for MS exacerbation and then went to inpatient rehab on 08/08/19. Patient then received home health therapy and now is returning to outpatient PT. PMh includes HTN, SVT, neurogenic bowel, MS (1995), neuropathy, hx of wrist fx, HPV, Hypercholesteremia, adiposity, osteopenia, spasticity. She wears bilateral AFOs and uses a RW and/or wheelchair for mobility. She drives an adapted car.    Limitations Lifting;Standing;Walking;House hold activities    How long can you sit comfortably? n/a  How long can you stand comfortably? 4 minutes    How long can you walk comfortably? walked 50 ft in inpatient rehab    Patient Stated Goals return to PLOF, increase walking, get into shower, increase mobility    Currently in Pain? Yes    Pain Score 2     Pain Location Back    Pain Orientation Left;Lower    Pain Descriptors / Indicators Aching    Pain Type Acute pain    Pain Onset Today    Pain Frequency Intermittent                 Missed last week due to getting an MRI and having to get a dental procedure done.      Standing in // bars:  Static stand  weight shift 10x each side focus on hip shift  Step over theraband and back 6x each LE, more challenging for LLE than RLE, requires seated rest breaks between LE's Tandem stance finger tips 6x each foot placement, no rest break required.  Knee bends/ mini squat 5x with UE support and w/c behind, 2 sets    Seated: STM with implementation of effleurage and ptrissage for pain reduction of L lumbar paraspinals and quadratus Swiss ball forward trunk rollout 10x 10 second holds each direction with patient reporting pain reduction.   french pectoral stretch 3x30 second holds   isometric contraction against PT resistance: -hip flexion 10x 3 second hold -hip abduction 10x 3 second hold -hip adduction 10x 3 second holds     Patient utilizes ice pack for reduction of internal temperature to reduce risk of MS exacerbation and allow continuation of progression of interventions.     Pt educated throughout session about proper posture and technique with exercises. Improved exercise technique, movement at target joints, use of target muscles after min to mod verbal, visual, tactile cues.    Patient's condition has the potential to improve in response to therapy. Maximum improvement is yet to be obtained. The anticipated improvement is attainable and reasonable in a generally predictable time.  Patient reports she is improving in strength and ability to stand with less support. Wants to be able to negotiate a small step                     PT Education - 03/23/20 1011    Education provided Yes    Education Details exercise technique, body mechanics    Person(s) Educated Patient    Methods Explanation;Demonstration;Tactile cues;Verbal cues    Comprehension Verbalized understanding;Returned demonstration;Verbal cues required;Tactile cues required            PT Short Term Goals - 02/10/20 1110      PT SHORT TERM GOAL #1   Title Patient will be independent in home exercise program  to improve strength/mobility for better functional independence with ADLs.    Baseline 5/19: HEP next session 6/22: HEP compliant 8/10 : HEP progressions compliant 8/31 HEP compliant 10/5: HEP compliant    Time 4    Period Weeks    Status Achieved    Target Date 01/13/20      PT SHORT TERM GOAL #2   Title Patient will perform a sit to stand with SUE support for increased ind. with transfers.    Baseline 5/19: heavy BUE support 6/22: requires one hand on walker one hand on w/c 8/10: able to perform intermittently (some days can some days cannot) 8/31: attempt next session 10/5: BUE reliance, patient  unable to complete 4 full stands. Attempt next session    Time 4    Period Weeks    Status Partially Met    Target Date 01/13/20             PT Long Term Goals - 03/09/20 0001      PT LONG TERM GOAL #1   Title Patient will increase FOTO score to equal to or greater than  53/100   to demonstrate statistically significant improvement in mobility and quality of life.     Baseline 8/31: 48.13% 7/27:  48.1% 6/22: 46.99% 5/18: 48/100 10/5: 49/100 11/2: 48%     Time 12    Period Weeks    Status Partially Met    Target Date 06/01/20      PT LONG TERM GOAL #2   Title Patient (< 31 years old) will complete five times sit to stand test in < 20 seconds indicating an increased LE strength and improved balance.    Baseline 7/27: 26.8 seconds with one partial stand 6/22: 40. 86 sec; unable to reach full stand last two attempts 5/19: 42 seconds 10/5: BUE reliance, patient unable to complete 4 full stands. Attempt next session 11/2: 23.71 seconds    Time 12    Status Partially Met    Target Date 06/01/20      PT LONG TERM GOAL #3   Title Patient will increase 10 meter walk test to <20 seconds as to improve gait speed for better community ambulation and to reduce fall risk.    Baseline  8/31: 35.1 seconds with RW 7/27: 36 seconds with RW 6/22: 36.1 seconds 5/19: 42 seconds 10/5: 31.9 seconds with RW  11/2: 29.9 seconds with RW     Time 12    Period Weeks    Status Partially Met    Target Date 06/01/20      PT LONG TERM GOAL #4   Title Patient will reduce timed up and go to <11 seconds to reduce fall risk and demonstrate improved transfer/gait ability.    Baseline  8/31: 50.8 seconds with RW 7/27: 43.65 6/22: 45.4 seconds 5/19: 47 seconds 10/5: 38.5 seconds with RW 11/2: 35.72 seconds with RW     Time 12    Period Weeks    Status Partially Met    Target Date 06/01/20      PT LONG TERM GOAL #5   Title Patient will ambulate from physical therapy gym to elevator down the hall, ride elevator to first floor, and walk to valet with RW and chair follow for increased capacity for mobility and ind.    Baseline 8/31: able to open door ~50 ft with one rest break 8/10: unable to ambulate to elevator without rest break 9/30: able to ambulate to elevator with 3 rest breaks    Time 12    Period Weeks    Status Partially Met    Target Date 06/01/20      PT LONG TERM GOAL #6   Title Patient will be able to stand with RW and lift one leg onto 4" step for carryover to negotiation of steps at home.     Baseline 11/2: unable to perform     Time 12    Period Weeks    Status New    Target Date 06/01/20                 Plan - 03/23/20 1713    Clinical Impression Statement Patient performed goals  on 03/09/20; please refer to this note for further details at this time. Back pain reduced with manual and therex interventions with patient reporting no pain by end of session.  Prolonged standing tolerance combined with increasing stabilization in standing allows for progression of interventions. Patient's condition has the potential to improve in response to therapy. Maximum improvement is yet to be obtained. The anticipated improvement is attainable and reasonable in a generally predictable time.  The patient would benefit from further skilled PT intervention to maximize QOL, safety, and functional  mobility    Personal Factors and Comorbidities Age;Comorbidity 3+;Fitness;Past/Current Experience;Sex;Time since onset of injury/illness/exacerbation    Comorbidities HTN, SVT, neurogenic bowel, MS (1995), neuropathy, hx of wrist fx, HPV, Hypercholesteremia, adiposity, osteopenia, spasticity.    Examination-Activity Limitations Bathing;Bed Mobility;Bend;Carry;Continence;Dressing;Hygiene/Grooming;Stairs;Squat;Reach Overhead;Locomotion Level;Lift;Stand;Transfers    Examination-Participation Restrictions Church;Cleaning;Community Activity;Laundry;Volunteer;Shop;Meal Prep;Yard Work    [employer] Evolving/Moderate complexity    Rehab Potential Good    PT Frequency 2x / week    PT Duration 12 weeks    PT Treatment/Interventions ADLs/Self Care Home Management;Aquatic Therapy;Biofeedback;Cryotherapy;Electrical Stimulation;Iontophoresis 46m/ml Dexamethasone;Traction;Moist Heat;Ultrasound;DME Instruction;Gait training;Stair training;Functional mobility training;Therapeutic activities;Therapeutic exercise;Balance training;Neuromuscular re-education;Patient/family education;Orthotic Fit/Training;Wheelchair mobility training;Manual techniques;Compression bandaging;Passive range of motion;Dry needling;Energy conservation;Taping;Splinting;Vestibular    PT Next Visit Plan step negotiation, turning directions to the L and R    PT Home Exercise Plan maintain current HEP from [place] Agree with Plan of Care Patient           Patient will benefit from skilled therapeutic intervention in order to improve the following deficits and impairments:  Abnormal gait, Decreased activity tolerance, Decreased balance, Decreased coordination, Decreased endurance, Decreased mobility, Decreased range of motion, Decreased strength, Difficulty walking, Impaired perceived functional ability, Impaired sensation, Impaired tone, Impaired UE functional use, Improper body mechanics, Postural  dysfunction, Cardiopulmonary status limiting activity, Impaired flexibility, Increased muscle spasms  Visit Diagnosis: Multiple sclerosis exacerbation (HCC)  Muscle weakness (generalized)  Other abnormalities of gait and mobility  Unsteadiness on feet     Problem List Patient Active Problem List   Diagnosis Date Noted   Hypoalbuminemia due to protein-calorie malnutrition ([facility]    Neurogenic bowel    Neurogenic bladder    Labile blood pressure    Neuropathic pain    Abscess of female pelvis    SVT (supraventricular tachycardia) (HCC)    Radial styloid tenosynovitis 03/12/2018   Wheelchair confinement 02/27/2018   Localized osteoporosis with current pathological fracture with routine healing 01/19/2017   Wrist fracture 01/16/2017   Sprain of ankle 03/23/2016   Closed fracture of lateral malleolus 03/16/2016   Health care maintenance 01/24/2016   Blood pressure elevated without history of HTN 10/25/2015   Essential hypertension 10/25/2015   Multiple sclerosis ([facility] 10/02/2015   Chronic left shoulder pain 07/19/2015   Multiple sclerosis exacerbation ([facility] 07/14/2015   MS (multiple sclerosis) ([facility] 11/26/2014   Increased body mass index 11/26/2014   HPV test positive 11/26/2014   Status post laparoscopic supracervical hysterectomy 11/26/2014   Galactorrhea 11/26/2014   Back ache 05/21/2014   Adiposity 05/21/2014   Disordered sleep 05/21/2014   Muscle spasticity 05/21/2014   Spasticity 05/21/2014   Calculus of kidney 014/43/1540  Renal colic 008/67/6195  Hypercholesteremia 08/19/2013   Hereditary and idiopathic neuropathy 08/19/2013   Hypercholesterolemia without hypertriglyceridemia 08/19/2013   Bladder infection, chronic 07/25/2012   Disorder of bladder function 07/25/2012   Incomplete bladder emptying 07/25/2012   Microscopic hematuria 07/25/2012   Right upper quadrant pain 07/25/2012  Janna Arch, PT, DPT   03/23/2020,  5:14 PM  [place] MAIN [facility] SERVICES [address] [place], [place], [address] Phone: [phone]   Fax:  [phone]  Name: Lisa Williams MRN: [id number] Date of Birth: 1960/05/23

## 2020-03-24 NOTE — H&P (Signed)
  Reason for visit: Multiple sclerosis  Referring physician: Dr. Potter  Lisa Williams is a 59 y.o. female  History of present illness:  Lisa Williams is a 59-year-old right-handed black female with a history of multiple sclerosis that was diagnosed in 1995.  The patient had just undergone some surgery on her right knee and had started to drag her leg.  The patient at one point was unable to get up out of bed, she went to the emergency room and underwent further evaluation.  The patient was found to have white matter lesions in the brain that were consistent with multiple sclerosis.  She was treated for multiple sclerosis through Dr. Peter Clarke, she was placed on Avonex and was on this for a number of years.  The patient eventually went off of Avonex and was not on any disease modifying agents for several years.  The patient was placed on Tecfidera in 2018 but could not tolerate the medication due to problems with diarrhea and bloating of the stomach.  The patient has not been able to work since 2001 secondary to severe fatigue associated with MS.  She has developed bilateral lower extremity weakness, she has difficulty with ambulation, she uses bilateral AFO braces and uses a walker for short distance travel and a wheelchair for longer distances.  She is able to operate a motor vehicle with hand controls.  She reports no significant issues controlling the bowels or the bladder.  She currently is in physical therapy.  She has decreased sensation in lower extremities.  She has been treated with rituximab for her multiple sclerosis over the last 2 years.  She is unsure that she wants to continue this mode of therapy.  She comes to this office for further evaluation management of her multiple sclerosis.  She denies any vision changes or difficulty with memory or concentration.  Currently, the patient lives with her mother and sister, she is independent for most of her activities of daily  living.  Past Medical History:  Diagnosis Date  . Abdominal pain, right upper quadrant   . Back pain   . Calculus of kidney 12/09/2013  . Chronic back pain    unspecified  . Chronic left shoulder pain 07/19/2015  . Functional disorder of bladder    other  . Galactorrhea 11/26/2014   Chronic   . Hereditary and idiopathic neuropathy 08/19/2013  . HPV test positive   . Hypercholesteremia 08/19/2013  . Incomplete bladder emptying   . Microscopic hematuria   . MS (multiple sclerosis) (HCC)   . Muscle spasticity 05/21/2014  . Nonspecific findings on examination of urine    other  . Osteopenia   . Status post laparoscopic supracervical hysterectomy 11/26/2014  . Tobacco user 11/26/2014  . Wrist fracture     Past Surgical History:  Procedure Laterality Date  . bilateral tubal ligation  1996  . BREAST CYST EXCISION Left 2002  . KNEE SURGERY     right  . LAPAROSCOPIC SUPRACERVICAL HYSTERECTOMY  08/05/2013  . ORIF WRIST FRACTURE Left 01/17/2017   Procedure: OPEN REDUCTION INTERNAL FIXATION (ORIF) WRIST FRACTURE;  Surgeon: Bowers, James R, MD;  Location: [facility] ORS;  Service: Orthopedics;  Laterality: Left;  . TUBAL LIGATION Bilateral   . VAGINAL HYSTERECTOMY  03/2006    Family History  Problem Relation Age of Onset  . Sickle cell trait Sister   . Hypertension Sister   . Supraventricular tachycardia Sister   . Hypertension Sister   . Diabetes Maternal   Grandmother   . Liver cancer Maternal Grandmother   . Diabetes Maternal Aunt   . Breast cancer Maternal Aunt        great MAT  . Ovarian cancer Paternal Grandmother   . Nephrolithiasis Father   . Hypertension Father   . Prostate cancer Father   . Kidney disease Father   . Heart failure Father   . Hypertension Sister   . Osteoarthritis Mother   . Hypertension Mother   . Hyperlipidemia Mother   . GU problems Neg Hx   . Urolithiasis Neg Hx     Social history:  reports that she has quit smoking. Her smoking use included cigarettes.  She smoked 0.50 packs per day. She has never used smokeless tobacco. She reports that she does not drink alcohol and does not use drugs.  Medications:  Prior to Admission medications   Medication Sig Start Date End Date Taking? Authorizing Provider  baclofen (LIORESAL) 20 MG tablet Take 1 tablet (20 mg total) by mouth 3 (three) times daily. 08/18/19   Angiulli, Daniel J, PA-C  Cholecalciferol (EQL VITAMIN D3) 25 MCG (1000 UT) tablet Take 2,000 Units by mouth daily.    [provider]  famotidine (PEPCID) 20 MG tablet Take 1 tablet (20 mg total) by mouth 2 (two) times daily. 08/18/19   Angiulli, Daniel J, PA-C  gabapentin (NEURONTIN) 100 MG capsule Take 1 capsule (100 mg total) by mouth 2 (two) times daily. 08/18/19   Angiulli, Daniel J, PA-C  ibandronate (BONIVA) 150 MG tablet TAKE 1 TABLET BY MOUTH EVERY 30 DAYS FIRST THING IN THE MORNING WITH A FULL GLASS OF WATER. DO NOT EAT, DRINK OR LIE DOWN FOR 60 MINUTES AFTER 05/12/19   [provider]  Multiple Vitamin (MULTIVITAMIN WITH MINERALS) TABS tablet Take 1 tablet by mouth daily. 08/19/19   Angiulli, Daniel J, PA-C  oxybutynin (DITROPAN-XL) 10 MG 24 hr tablet Take 1 tablet (10 mg total) by mouth daily. 08/18/19   Angiulli, Daniel J, PA-C      Allergies  Allergen Reactions  . Amlodipine Other (See Comments)    edema  . Povidone Iodine Rash    ROS:  Out of a complete 14 system review of symptoms, the patient complains only of the following symptoms, and all other reviewed systems are negative.  Walking difficulty Numbness in the legs Fatigue  Blood pressure (!) 156/86, pulse 78, height 5' 11" (1.803 m).  Physical Exam  General: The patient is alert and cooperative at the time of the examination.  Patient is moderately to markedly obese.  Eyes: Pupils are equal, round, and reactive to light. Discs are flat bilaterally.  Neck: The neck is supple, no carotid bruits are noted.  Respiratory: The respiratory examination is  clear.  Cardiovascular: The cardiovascular examination reveals a regular rate and rhythm, no obvious murmurs or rubs are noted.  Skin: Extremities are with 2+ edema below the knees bilaterally  Neurologic Exam  Mental status: The patient is alert and oriented x 3 at the time of the examination. The patient has apparent normal recent and remote memory, with an apparently normal attention span and concentration ability.  Cranial nerves: Facial symmetry is present. There is good sensation of the face to pinprick and soft touch bilaterally. The strength of the facial muscles and the muscles to head turning and shoulder shrug are normal bilaterally. Speech is well enunciated, no aphasia or dysarthria is noted. Extraocular movements are full. Visual fields are full. The tongue is midline,   and the patient has symmetric elevation of the soft palate. No obvious hearing deficits are noted.  Motor: The motor testing reveals 5 over 5 strength of the upper extremities.  The patient has diffuse 3/5 strength in both lower extremities. Good symmetric motor tone is noted throughout.  Sensory: Sensory testing is intact to pinprick, soft touch, vibration sensation, and position sense on the upper extremities.  The patient has decreased pinprick sensation in the left leg as compared to the right, decreased vibration and position sense is seen in both legs no evidence of extinction is noted.  Coordination: Cerebellar testing reveals good finger-nose-finger bilaterally.  The patient not able to perform heel-to-shin on either side.  Gait and station: The patient is able to walk short distances with a walker, the gait is wide-based, slow and deliberate.  Reflexes: Deep tendon reflexes are symmetric and are depressed bilaterally. Toes are upgoing bilaterally.   Assessment/Plan:  1.  Multiple sclerosis  2.  Gait disorder  The patient currently is on rituximab for her multiple sclerosis, she does not wish to  continue the medication.  We discussed potentially starting on Mayzent which has indication for both relapsing remitting and secondary progressive multiple sclerosis.  The patient has developed a significant paraparesis associated with a gait disorder.  The patient will undergo blood work today.  We will try to initiate the Mayzent in the near future, she will see her ophthalmologist.  The patient will follow up here in 6 months.  We will set the patient up for MRI of the brain and cervical spine, she claims that she is extremely claustrophobic and needs general anesthesia.   C. Keith Braiden Presutti MD 03/01/2020 9:23 AM  [facility] [address] , [place] [address]  Phone [phone] Fax [phone]  

## 2020-03-25 ENCOUNTER — Ambulatory Visit: Payer: PPO

## 2020-03-25 ENCOUNTER — Other Ambulatory Visit: Payer: Self-pay

## 2020-03-25 DIAGNOSIS — M6281 Muscle weakness (generalized): Secondary | ICD-10-CM

## 2020-03-25 DIAGNOSIS — G35 Multiple sclerosis: Secondary | ICD-10-CM

## 2020-03-25 DIAGNOSIS — R2681 Unsteadiness on feet: Secondary | ICD-10-CM

## 2020-03-25 DIAGNOSIS — R2689 Other abnormalities of gait and mobility: Secondary | ICD-10-CM

## 2020-03-25 NOTE — Therapy (Signed)
[place] [facility]IN [facility] SERVICES [address] [place], [place], [address] Phone: [phone]   Fax:  [phone]  Physical Therapy Treatment  Patient Details  Name: Lisa Williams MRN: [id number] Date of Birth: 03/19/1961 Referring Provider (PT): Gurney Maxin, MD   Encounter Date: 03/25/2020   PT End of Session - 03/25/20 1309    Visit Number 51    Number of Visits 72    Date for PT Re-Evaluation 06/01/20    Authorization Type 1/10 PN 03/23/20    PT Start Time 1016    PT Stop Time 1059    PT Time Calculation (min) 43 min    Equipment Utilized During Treatment Gait belt;Other (comment)   bilateral AFOs   Activity Tolerance Patient tolerated treatment well    Behavior During Therapy WFL for tasks assessed/performed           Past Medical History:  Diagnosis Date  . Abdominal pain, right upper quadrant   . Back pain   . Calculus of kidney 12/09/2013  . Chronic back pain    unspecified  . Chronic left shoulder pain 07/19/2015  . Complication of anesthesia   . Functional disorder of bladder    other  . Galactorrhea 11/26/2014   Chronic   . Hereditary and idiopathic neuropathy 08/19/2013  . HPV test positive   . Hypercholesteremia 08/19/2013  . Hypertension   . Incomplete bladder emptying   . Microscopic hematuria   . MS (multiple sclerosis) ([place])   . Muscle spasticity 05/21/2014  . Nonspecific findings on examination of urine    other  . Osteopenia   . PONV (postoperative nausea and vomiting)   . Status post laparoscopic supracervical hysterectomy 11/26/2014  . Tobacco user 11/26/2014  . Wrist fracture     Past Surgical History:  Procedure Laterality Date  . bilateral tubal ligation  1996  . BREAST CYST EXCISION Left 2002  . FRACTURE SURGERY    . KNEE SURGERY     right  . LAPAROSCOPIC SUPRACERVICAL HYSTERECTOMY  08/05/2013  . ORIF WRIST FRACTURE Left 01/17/2017   Procedure: OPEN REDUCTION INTERNAL FIXATION (ORIF)  WRIST FRACTURE;  Surgeon: Lovell Sheehan, MD;  Location: [facility] ORS;  Service: Orthopedics;  Laterality: Left;  . RADIOLOGY WITH ANESTHESIA N/A 03/18/2020   Procedure: MRI WITH ANESTHESIA CERVICAL SPINE AND BRAIN  WITH AND WITHOUT CONTRAST;  Surgeon: Radiologist, Medication, MD;  Location: [place] [facility] [place];  Service: Radiology;  Laterality: N/A;  . TUBAL LIGATION Bilateral   . VAGINAL HYSTERECTOMY  03/2006    There were no vitals filed for this visit.   Subjective Assessment - 03/25/20 1307    Subjective Patient reports no falls or LOB since last session. Still feeling tired from last week but eager to participate this session.    Pertinent History Patient is previous patient of this therapist, was admitted to the hospital on 08/03/19 for MS exacerbation and then went to inpatient rehab on 08/08/19. Patient then received home health therapy and now is returning to outpatient PT. PMh includes HTN, SVT, neurogenic bowel, MS (1995), neuropathy, hx of wrist fx, HPV, Hypercholesteremia, adiposity, osteopenia, spasticity. She wears bilateral AFOs and uses a RW and/or wheelchair for mobility. She drives an adapted car.    Limitations Lifting;Standing;Walking;House hold activities    How long can you sit comfortably? n/a    How long can you stand comfortably? 4 minutes    How long can you walk comfortably? walked 50 ft in inpatient rehab  Patient Stated Goals return to PLOF, increase walking, get into shower, increase mobility    Currently in Pain? No/denies    Pain Onset --               Gait: Patient ambulates 29f, seated rest break then additional 40 ft with RW. CGA and wheelchair follow for safety. Patient able to navigate opening doors, turns, and thresholds independently but with inc time to perform. Patient demonstrates inc reliance on BUE by end of gait trial.   Seated: Sit to stands 5x  isometric contraction against PT resistance: -hip flexion 10x 3 second hold -hip abduction 10x 3 second  hold -hip adduction 10x 3 second holds Pectoral stretch combined with arnold's 15x GTB row 15x  GTB bilateral ER 15x   Car transfer, bring wheelchair close to door, STS with RW, walk 3 steps, reach across and sit down, pick up LE with UE"s to bring into car. Put w/c in back of car and walker next to patient.   Ice pack donned throughout session to minimize exacerbation of MS symptoms in order to perform interventions.    Pt educated throughout session about proper posture and technique with exercises. Improved exercise technique, movement at target joints, use of target muscles after min to mod verbal, visual, tactile cues.                       PT Education - 03/25/20 1309    Education provided Yes    Education Details exercise technique, body mechanics    Person(s) Educated Patient    Methods Explanation;Demonstration;Tactile cues;Verbal cues    Comprehension Verbalized understanding;Returned demonstration;Verbal cues required;Tactile cues required            PT Short Term Goals - 02/10/20 1110      PT SHORT TERM GOAL #1   Title Patient will be independent in home exercise program to improve strength/mobility for better functional independence with ADLs.    Baseline 5/19: HEP next session 6/22: HEP compliant 8/10 : HEP progressions compliant 8/31 HEP compliant 10/5: HEP compliant    Time 4    Period Weeks    Status Achieved    Target Date 01/13/20      PT SHORT TERM GOAL #2   Title Patient will perform a sit to stand with SUE support for increased ind. with transfers.    Baseline 5/19: heavy BUE support 6/22: requires one hand on walker one hand on w/c 8/10: able to perform intermittently (some days can some days cannot) 8/31: attempt next session 10/5: BUE reliance, patient unable to complete 4 full stands. Attempt next session    Time 4    Period Weeks    Status Partially Met    Target Date 01/13/20             PT Long Term Goals - 03/09/20 0001       PT LONG TERM GOAL #1   Title Patient will increase FOTO score to equal to or greater than  53/100   to demonstrate statistically significant improvement in mobility and quality of life.     Baseline 8/31: 48.13% 7/27:  48.1% 6/22: 46.99% 5/18: 48/100 10/5: 49/100 11/2: 48%     Time 12    Period Weeks    Status Partially Met    Target Date 06/01/20      PT LONG TERM GOAL #2   Title Patient (< 675years old) will complete five times sit to  stand test in < 20 seconds indicating an increased LE strength and improved balance.    Baseline 7/27: 26.8 seconds with one partial stand 6/22: 40. 86 sec; unable to reach full stand last two attempts 5/19: 42 seconds 10/5: BUE reliance, patient unable to complete 4 full stands. Attempt next session 11/2: 23.71 seconds    Time 12    Status Partially Met    Target Date 06/01/20      PT LONG TERM GOAL #3   Title Patient will increase 10 meter walk test to <20 seconds as to improve gait speed for better community ambulation and to reduce fall risk.    Baseline  8/31: 35.1 seconds with RW 7/27: 36 seconds with RW 6/22: 36.1 seconds 5/19: 42 seconds 10/5: 31.9 seconds with RW 11/2: 29.9 seconds with RW     Time 12    Period Weeks    Status Partially Met    Target Date 06/01/20      PT LONG TERM GOAL #4   Title Patient will reduce timed up and go to <11 seconds to reduce fall risk and demonstrate improved transfer/gait ability.    Baseline  8/31: 50.8 seconds with RW 7/27: 43.65 6/22: 45.4 seconds 5/19: 47 seconds 10/5: 38.5 seconds with RW 11/2: 35.72 seconds with RW     Time 12    Period Weeks    Status Partially Met    Target Date 06/01/20      PT LONG TERM GOAL #5   Title Patient will ambulate from physical therapy gym to elevator down the hall, ride elevator to first floor, and walk to valet with RW and chair follow for increased capacity for mobility and ind.    Baseline 8/31: able to open door ~50 ft with one rest break 8/10: unable to  ambulate to elevator without rest break 9/30: able to ambulate to elevator with 3 rest breaks    Time 12    Period Weeks    Status Partially Met    Target Date 06/01/20      PT LONG TERM GOAL #6   Title Patient will be able to stand with RW and lift one leg onto 4" step for carryover to negotiation of steps at home.     Baseline 11/2: unable to perform     Time 12    Period Weeks    Status New    Target Date 06/01/20                 Plan - 03/25/20 1310    Clinical Impression Statement Patient tolerated ambulation well despite fatigue from procedures last week. Ambulation terminated when patient began overheating. Seated interventions performed with ice pack applied to allow for continued strengthening in a safe environment. The patient would benefit from further skilled PT intervention to maximize QOL, safety, and functional mobility    Personal Factors and Comorbidities Age;Comorbidity 3+;Fitness;Past/Current Experience;Sex;Time since onset of injury/illness/exacerbation    Comorbidities HTN, SVT, neurogenic bowel, MS (1995), neuropathy, hx of wrist fx, HPV, Hypercholesteremia, adiposity, osteopenia, spasticity.    Examination-Activity Limitations Bathing;Bed Mobility;Bend;Carry;Continence;Dressing;Hygiene/Grooming;Stairs;Squat;Reach Overhead;Locomotion Level;Lift;Stand;Transfers    Examination-Participation Restrictions Church;Cleaning;Community Activity;Laundry;Volunteer;Shop;Meal Prep;Yard Work    [employer] Evolving/Moderate complexity    Rehab Potential Good    PT Frequency 2x / week    PT Duration 12 weeks    PT Treatment/Interventions ADLs/Self Care Home Management;Aquatic Therapy;Biofeedback;Cryotherapy;Electrical Stimulation;Iontophoresis 31m/ml Dexamethasone;Traction;Moist Heat;Ultrasound;DME Instruction;Gait training;Stair training;Functional mobility training;Therapeutic activities;Therapeutic exercise;Balance training;Neuromuscular  re-education;Patient/family education;Orthotic Fit/Training;Wheelchair mobility training;Manual  techniques;Compression bandaging;Passive range of motion;Dry needling;Energy conservation;Taping;Splinting;Vestibular    PT Next Visit Plan step negotiation, turning directions to the L and R    PT Home Exercise Plan maintain current HEP from HHPT    Consulted and Agree with Plan of Care Patient           Patient will benefit from skilled therapeutic intervention in order to improve the following deficits and impairments:  Abnormal gait, Decreased activity tolerance, Decreased balance, Decreased coordination, Decreased endurance, Decreased mobility, Decreased range of motion, Decreased strength, Difficulty walking, Impaired perceived functional ability, Impaired sensation, Impaired tone, Impaired UE functional use, Improper body mechanics, Postural dysfunction, Cardiopulmonary status limiting activity, Impaired flexibility, Increased muscle spasms  Visit Diagnosis: Multiple sclerosis exacerbation (HCC)  Muscle weakness (generalized)  Other abnormalities of gait and mobility  Unsteadiness on feet     Problem List Patient Active Problem List   Diagnosis Date Noted  . Hypoalbuminemia due to protein-calorie malnutrition ([place])   . Neurogenic bowel   . Neurogenic bladder   . Labile blood pressure   . Neuropathic pain   . Abscess of female pelvis   . SVT (supraventricular tachycardia) ([place])   . Radial styloid tenosynovitis 03/12/2018  . Wheelchair confinement 02/27/2018  . Localized osteoporosis with current pathological fracture with routine healing 01/19/2017  . Wrist fracture 01/16/2017  . Sprain of ankle 03/23/2016  . Closed fracture of lateral malleolus 03/16/2016  . Health care maintenance 01/24/2016  . Blood pressure elevated without history of HTN 10/25/2015  . Essential hypertension 10/25/2015  . Multiple sclerosis ([place]) 10/02/2015  . Chronic left shoulder pain 07/19/2015  .  Multiple sclerosis exacerbation ([place]) 07/14/2015  . MS (multiple sclerosis) ([place]) 11/26/2014  . Increased body mass index 11/26/2014  . HPV test positive 11/26/2014  . Status post laparoscopic supracervical hysterectomy 11/26/2014  . Galactorrhea 11/26/2014  . Back ache 05/21/2014  . Adiposity 05/21/2014  . Disordered sleep 05/21/2014  . Muscle spasticity 05/21/2014  . Spasticity 05/21/2014  . Calculus of kidney 12/09/2013  . Renal colic 62/22/9798  . Hypercholesteremia 08/19/2013  . Hereditary and idiopathic neuropathy 08/19/2013  . Hypercholesterolemia without hypertriglyceridemia 08/19/2013  . Bladder infection, chronic 07/25/2012  . Disorder of bladder function 07/25/2012  . Incomplete bladder emptying 07/25/2012  . Microscopic hematuria 07/25/2012  . Right upper quadrant pain 07/25/2012   Janna Arch, PT, DPT   03/25/2020, 1:12 PM  [place] [facility]IN [facility] SERVICES [address] [place], [place], [address] Phone: [phone]   Fax:  [phone]  Name: Georgeanna Radziewicz Viscardi MRN: [id number] Date of Birth: Mar 13, 1961

## 2020-03-29 ENCOUNTER — Telehealth: Payer: Self-pay | Admitting: *Deleted

## 2020-03-29 NOTE — Telephone Encounter (Signed)
EYE EXAM done today,  Results pending.

## 2020-03-30 ENCOUNTER — Ambulatory Visit: Payer: PPO

## 2020-03-30 ENCOUNTER — Other Ambulatory Visit: Payer: Self-pay

## 2020-03-30 ENCOUNTER — Telehealth: Payer: Self-pay | Admitting: Urology

## 2020-03-30 ENCOUNTER — Other Ambulatory Visit: Payer: Self-pay | Admitting: Urology

## 2020-03-30 DIAGNOSIS — G35 Multiple sclerosis: Secondary | ICD-10-CM

## 2020-03-30 DIAGNOSIS — R2681 Unsteadiness on feet: Secondary | ICD-10-CM

## 2020-03-30 DIAGNOSIS — M6281 Muscle weakness (generalized): Secondary | ICD-10-CM

## 2020-03-30 DIAGNOSIS — R2689 Other abnormalities of gait and mobility: Secondary | ICD-10-CM

## 2020-03-30 NOTE — Therapy (Signed)
[place] MAIN [facility] SERVICES [address] [place], [place], [address] Phone: [phone]   Fax:  [phone]  Physical Therapy Treatment  Patient Details  Name: Lisa Williams MRN: [id number] Date of Birth: 10-22-1960 Referring Provider (PT): Gurney Maxin, MD   Encounter Date: 03/30/2020   PT End of Session - 03/30/20 1717    Visit Number 52    Number of Visits 72    Date for PT Re-Evaluation 06/01/20    Authorization Type 2/10 PN 03/23/20    PT Start Time 1015    PT Stop Time 1058    PT Time Calculation (min) 43 min    Equipment Utilized During Treatment Gait belt;Other (comment)   bilateral AFOs   Activity Tolerance Patient tolerated treatment well    Behavior During Therapy WFL for tasks assessed/performed           Past Medical History:  Diagnosis Date  . Abdominal pain, right upper quadrant   . Back pain   . Calculus of kidney 12/09/2013  . Chronic back pain    unspecified  . Chronic left shoulder pain 07/19/2015  . Complication of anesthesia   . Functional disorder of bladder    other  . Galactorrhea 11/26/2014   Chronic   . Hereditary and idiopathic neuropathy 08/19/2013  . HPV test positive   . Hypercholesteremia 08/19/2013  . Hypertension   . Incomplete bladder emptying   . Microscopic hematuria   . MS (multiple sclerosis) ([place])   . Muscle spasticity 05/21/2014  . Nonspecific findings on examination of urine    other  . Osteopenia   . PONV (postoperative nausea and vomiting)   . Status post laparoscopic supracervical hysterectomy 11/26/2014  . Tobacco user 11/26/2014  . Wrist fracture     Past Surgical History:  Procedure Laterality Date  . bilateral tubal ligation  1996  . BREAST CYST EXCISION Left 2002  . FRACTURE SURGERY    . KNEE SURGERY     right  . LAPAROSCOPIC SUPRACERVICAL HYSTERECTOMY  08/05/2013  . ORIF WRIST FRACTURE Left 01/17/2017   Procedure: OPEN REDUCTION INTERNAL FIXATION (ORIF)  WRIST FRACTURE;  Surgeon: Lovell Sheehan, MD;  Location: [facility] ORS;  Service: Orthopedics;  Laterality: Left;  . RADIOLOGY WITH ANESTHESIA N/A 03/18/2020   Procedure: MRI WITH ANESTHESIA CERVICAL SPINE AND BRAIN  WITH AND WITHOUT CONTRAST;  Surgeon: Radiologist, Medication, MD;  Location: [facility];  Service: Radiology;  Laterality: N/A;  . TUBAL LIGATION Bilateral   . VAGINAL HYSTERECTOMY  03/2006    There were no vitals filed for this visit.   Subjective Assessment - 03/30/20 1715    Subjective Patient reports recent onset of full body spasms that begin at rib cage and radiate to feet. No falls but nothing seems to improve her spasms.    Pertinent History Patient is previous patient of this therapist, was admitted to the hospital on 08/03/19 for MS exacerbation and then went to inpatient rehab on 08/08/19. Patient then received home health therapy and now is returning to outpatient PT. PMh includes HTN, SVT, neurogenic bowel, MS (1995), neuropathy, hx of wrist fx, HPV, Hypercholesteremia, adiposity, osteopenia, spasticity. She wears bilateral AFOs and uses a RW and/or wheelchair for mobility. She drives an adapted car.    Limitations Lifting;Standing;Walking;House hold activities    How long can you sit comfortably? n/a    How long can you stand comfortably? 4 minutes    How long can you walk comfortably? walked 50  ft in inpatient rehab    Patient Stated Goals return to PLOF, increase walking, get into shower, increase mobility    Currently in Pain? No/denies               Treatment: Standing in // bars: Weight shift with focus on lateral shift utilizing hips/LE's rather than pulling with UE's x 10 each side Lateral stepping 10x each side, terminated early on LLE due to spasms  Seated: Swiss ball forward rollout 10x Lateral/diagonal swiss ball rollout 4x each angle TrA contraction pressing swiss ball between knees and hands 10x 3 second holds TrA contraction pressing swiss ball between  knees and hands with UE raise 10x each UE Overhead with trunk extension lean swiss ball raise 10x with cues for arc of motion to PT hand GTB row 15x alternating lateral trunk leans, challenging to return to COM 8x each side   isometric contraction against PT resistance: -hip flexion 10x 3 second hold -hip abduction 10x 3 second hold -hip adduction 10x 3 second holds      Ice pack donned throughout session to minimize exacerbation of MS symptoms in order to perform interventions.    Pt educated throughout session about proper posture and technique with exercises. Improved exercise technique, movement at target joints, use of target muscles after min to mod verbal, visual, tactile cues.                   PT Education - 03/30/20 1716    Education provided Yes    Education Details exercise technique, body mechanics    Person(s) Educated Patient    Methods Explanation;Demonstration;Tactile cues;Verbal cues    Comprehension Verbalized understanding;Returned demonstration;Verbal cues required;Tactile cues required            PT Short Term Goals - 02/10/20 1110      PT SHORT TERM GOAL #1   Title Patient will be independent in home exercise program to improve strength/mobility for better functional independence with ADLs.    Baseline 5/19: HEP next session 6/22: HEP compliant 8/10 : HEP progressions compliant 8/31 HEP compliant 10/5: HEP compliant    Time 4    Period Weeks    Status Achieved    Target Date 01/13/20      PT SHORT TERM GOAL #2   Title Patient will perform a sit to stand with SUE support for increased ind. with transfers.    Baseline 5/19: heavy BUE support 6/22: requires one hand on walker one hand on w/c 8/10: able to perform intermittently (some days can some days cannot) 8/31: attempt next session 10/5: BUE reliance, patient unable to complete 4 full stands. Attempt next session    Time 4    Period Weeks    Status Partially Met    Target Date  01/13/20             PT Long Term Goals - 03/09/20 0001      PT LONG TERM GOAL #1   Title Patient will increase FOTO score to equal to or greater than  53/100   to demonstrate statistically significant improvement in mobility and quality of life.     Baseline 8/31: 48.13% 7/27:  48.1% 6/22: 46.99% 5/18: 48/100 10/5: 49/100 11/2: 48%     Time 12    Period Weeks    Status Partially Met    Target Date 06/01/20      PT LONG TERM GOAL #2   Title Patient (< 27 years old) will complete  five times sit to stand test in < 20 seconds indicating an increased LE strength and improved balance.    Baseline 7/27: 26.8 seconds with one partial stand 6/22: 40. 86 sec; unable to reach full stand last two attempts 5/19: 42 seconds 10/5: BUE reliance, patient unable to complete 4 full stands. Attempt next session 11/2: 23.71 seconds    Time 12    Status Partially Met    Target Date 06/01/20      PT LONG TERM GOAL #3   Title Patient will increase 10 meter walk test to <20 seconds as to improve gait speed for better community ambulation and to reduce fall risk.    Baseline  8/31: 35.1 seconds with RW 7/27: 36 seconds with RW 6/22: 36.1 seconds 5/19: 42 seconds 10/5: 31.9 seconds with RW 11/2: 29.9 seconds with RW     Time 12    Period Weeks    Status Partially Met    Target Date 06/01/20      PT LONG TERM GOAL #4   Title Patient will reduce timed up and go to <11 seconds to reduce fall risk and demonstrate improved transfer/gait ability.    Baseline  8/31: 50.8 seconds with RW 7/27: 43.65 6/22: 45.4 seconds 5/19: 47 seconds 10/5: 38.5 seconds with RW 11/2: 35.72 seconds with RW     Time 12    Period Weeks    Status Partially Met    Target Date 06/01/20      PT LONG TERM GOAL #5   Title Patient will ambulate from physical therapy gym to elevator down the hall, ride elevator to first floor, and walk to valet with RW and chair follow for increased capacity for mobility and ind.    Baseline 8/31: able  to open door ~50 ft with one rest break 8/10: unable to ambulate to elevator without rest break 9/30: able to ambulate to elevator with 3 rest breaks    Time 12    Period Weeks    Status Partially Met    Target Date 06/01/20      PT LONG TERM GOAL #6   Title Patient will be able to stand with RW and lift one leg onto 4" step for carryover to negotiation of steps at home.     Baseline 11/2: unable to perform     Time 12    Period Weeks    Status New    Target Date 06/01/20                 Plan - 03/30/20 1723    Clinical Impression Statement Patient's session limited today due to frequent episodes of spasms throughout body. Patient agreeable to discuss new change with physician if continue to persist. Patient continues to remain highly motivated throughout session despite spasms. The patient would benefit from further skilled PT intervention to maximize QOL, safety, and functional mobility    Personal Factors and Comorbidities Age;Comorbidity 3+;Fitness;Past/Current Experience;Sex;Time since onset of injury/illness/exacerbation    Comorbidities HTN, SVT, neurogenic bowel, MS (1995), neuropathy, hx of wrist fx, HPV, Hypercholesteremia, adiposity, osteopenia, spasticity.    Examination-Activity Limitations Bathing;Bed Mobility;Bend;Carry;Continence;Dressing;Hygiene/Grooming;Stairs;Squat;Reach Overhead;Locomotion Level;Lift;Stand;Transfers    Examination-Participation Restrictions Church;Cleaning;Community Activity;Laundry;Volunteer;Shop;Meal Prep;Yard Work    [employer]chant [employer] officer Evolving/Moderate complexity    Rehab Potential Good    PT Frequency 2x / week    PT Duration 12 weeks    PT Treatment/Interventions ADLs/Self Care Home Management;Aquatic Therapy;Biofeedback;Cryotherapy;Electrical Stimulation;Iontophoresis 44m/ml Dexamethasone;Traction;Moist Heat;Ultrasound;DME Instruction;Gait training;Stair training;Functional mobility training;Therapeutic  activities;Therapeutic exercise;Balance  training;Neuromuscular re-education;Patient/family education;Orthotic Fit/Training;Wheelchair mobility training;Manual techniques;Compression bandaging;Passive range of motion;Dry needling;Energy conservation;Taping;Splinting;Vestibular    PT Next Visit Plan step negotiation, turning directions to the L and R    PT Home Exercise Plan maintain current HEP from HHPT    Consulted and Agree with Plan of Care Patient           Patient will benefit from skilled therapeutic intervention in order to improve the following deficits and impairments:  Abnormal gait, Decreased activity tolerance, Decreased balance, Decreased coordination, Decreased endurance, Decreased mobility, Decreased range of motion, Decreased strength, Difficulty walking, Impaired perceived functional ability, Impaired sensation, Impaired tone, Impaired UE functional use, Improper body mechanics, Postural dysfunction, Cardiopulmonary status limiting activity, Impaired flexibility, Increased muscle spasms  Visit Diagnosis: Multiple sclerosis exacerbation ([facility])  Muscle weakness (generalized)  Other abnormalities of gait and mobility  Unsteadiness on feet     Problem List Patient Active Problem List   Diagnosis Date Noted  . Hypoalbuminemia due to protein-calorie malnutrition ([place])   . Neurogenic bowel   . Neurogenic bladder   . Labile blood pressure   . Neuropathic pain   . Abscess of female pelvis   . SVT (supraventricular tachycardia) ([place])   . Radial styloid tenosynovitis 03/12/2018  . Wheelchair confinement 02/27/2018  . Localized osteoporosis with current pathological fracture with routine healing 01/19/2017  . Wrist fracture 01/16/2017  . Sprain of ankle 03/23/2016  . Closed fracture of lateral malleolus 03/16/2016  . Health care maintenance 01/24/2016  . Blood pressure elevated without history of HTN 10/25/2015  . Essential hypertension 10/25/2015  . Multiple sclerosis  ([place]) 10/02/2015  . Chronic left shoulder pain 07/19/2015  . Multiple sclerosis exacerbation ([place]) 07/14/2015  . MS (multiple sclerosis) ([place]) 11/26/2014  . Increased body mass index 11/26/2014  . HPV test positive 11/26/2014  . Status post laparoscopic supracervical hysterectomy 11/26/2014  . Galactorrhea 11/26/2014  . Back ache 05/21/2014  . Adiposity 05/21/2014  . Disordered sleep 05/21/2014  . Muscle spasticity 05/21/2014  . Spasticity 05/21/2014  . Calculus of kidney 12/09/2013  . Renal colic 40/98/1191  . Hypercholesteremia 08/19/2013  . Hereditary and idiopathic neuropathy 08/19/2013  . Hypercholesterolemia without hypertriglyceridemia 08/19/2013  . Bladder infection, chronic 07/25/2012  . Disorder of bladder function 07/25/2012  . Incomplete bladder emptying 07/25/2012  . Microscopic hematuria 07/25/2012  . Right upper quadrant pain 07/25/2012   Janna Arch, PT, DPT   03/30/2020, 5:25 PM  [place] MAIN [facility] SERVICES [address] [place], [place], [address] Phone: [phone]   Fax:  [phone]  Name: Meryn Sarracino Hisle MRN: [id number] Date of Birth: 1960/08/04

## 2020-03-30 NOTE — Telephone Encounter (Signed)
Error

## 2020-03-31 ENCOUNTER — Telehealth: Payer: Self-pay | Admitting: Neurology

## 2020-03-31 NOTE — Telephone Encounter (Signed)
The patient has had an ophthalmology evaluation with macular edema screen on 30 March 2020.  No evidence of macular edema was seen in either eye.  This was an in home evaluation.

## 2020-03-31 NOTE — Telephone Encounter (Signed)
I spoke with Dr. Anne Hahn. He reviewed pt's ME screening and has cleared pt for mayzent therapy.   I called pt and explained this to her. I advised her to expect a call from the Specialty Pharmacy within a week to set up shipment. Pt verbalized understanding.

## 2020-03-31 NOTE — Telephone Encounter (Signed)
Received eye exam results. Will give to Dr. Anne Hahn for review.

## 2020-04-05 ENCOUNTER — Telehealth: Payer: Self-pay | Admitting: Neurology

## 2020-04-05 NOTE — Telephone Encounter (Signed)
I called pt to discuss. No answer, left a message asking her to call me back. 

## 2020-04-05 NOTE — Telephone Encounter (Signed)
Noted  

## 2020-04-05 NOTE — Telephone Encounter (Signed)
Noted. I called [facility] and confirmed mayzent 2mg  RX.

## 2020-04-05 NOTE — Telephone Encounter (Signed)
See telephone note from 04/05/20.

## 2020-04-05 NOTE — Telephone Encounter (Signed)
I called [facility], spoke with Brett Canales, pharmacist, and confirmed mayzent 2mg  RX.

## 2020-04-05 NOTE — Telephone Encounter (Signed)
Pt. is asking to get a call back from RN.

## 2020-04-05 NOTE — Telephone Encounter (Signed)
Received this notice from the [employer] rep, Marcelino Duster: "She told the Mayzent Adherence nurse that her doctor is wanting her to have a first dose observation (FDO) is that correct? It is not required but is performed at the request of the HCP if indicated. We are happy to coordinate if needed."

## 2020-04-05 NOTE — Telephone Encounter (Signed)
I called the patient.  The patient does not need a first dose observation on the Mayzent.  She will take the medication when it comes in through the pharmacy.

## 2020-04-05 NOTE — Telephone Encounter (Signed)
Pt returned my call. She reports that Dr. Anne Hahn indicated during her appt that an FDO for mayzent would be necessary. This was not reflected on the mayzent start form. Pt would like an FDO as long as Dr. Anne Hahn does agree. [employer] would set the FDO up at her home in the next few weeks. I advised her that Dr. Anne Hahn is out of the office this week but I would discuss this with him next week. Pt verbalized understanding and agreement.

## 2020-04-05 NOTE — Telephone Encounter (Signed)
Phone rep worked Risk manager.  [email or web address] has called re: the Siponimod Fumarate (MAYZENT) 2 MG TABS for pt.  There is information that needs verifying for the prescription referral.  Victorino Dike is asking to be called back at [phone] option 2 ask for the Prescriber Contact Team.  Victorino Dike states if she is not avail please leave a vm including your last name.   Victorino Dike states the Rx will not be filled until they have all needed information.

## 2020-04-06 ENCOUNTER — Other Ambulatory Visit: Payer: Self-pay

## 2020-04-06 ENCOUNTER — Encounter: Payer: Self-pay | Admitting: Urology

## 2020-04-06 ENCOUNTER — Ambulatory Visit: Payer: PPO

## 2020-04-06 ENCOUNTER — Ambulatory Visit (INDEPENDENT_AMBULATORY_CARE_PROVIDER_SITE_OTHER): Payer: PPO | Admitting: Urology

## 2020-04-06 VITALS — BP 138/80 | HR 60

## 2020-04-06 DIAGNOSIS — N3281 Overactive bladder: Secondary | ICD-10-CM

## 2020-04-06 DIAGNOSIS — M6281 Muscle weakness (generalized): Secondary | ICD-10-CM

## 2020-04-06 DIAGNOSIS — G35 Multiple sclerosis: Secondary | ICD-10-CM | POA: Diagnosis not present

## 2020-04-06 DIAGNOSIS — R2681 Unsteadiness on feet: Secondary | ICD-10-CM

## 2020-04-06 DIAGNOSIS — R2689 Other abnormalities of gait and mobility: Secondary | ICD-10-CM

## 2020-04-06 LAB — BLADDER SCAN AMB NON-IMAGING: Scan Result: 10

## 2020-04-06 MED ORDER — OXYBUTYNIN CHLORIDE ER 10 MG PO TB24
10.0000 mg | ORAL_TABLET | Freq: Every day | ORAL | 3 refills | Status: DC
Start: 2020-04-06 — End: 2021-04-06

## 2020-04-06 MED ORDER — OXYBUTYNIN CHLORIDE ER 10 MG PO TB24
10.0000 mg | ORAL_TABLET | Freq: Every day | ORAL | 11 refills | Status: DC
Start: 2020-04-06 — End: 2020-04-06

## 2020-04-06 NOTE — Progress Notes (Signed)
04/06/2020 12:49 PM   Lam Bjorklund Felter [birth year] [id number]  Referring provider: Lauro Regulus, MD [address] [facility] [place],  [place] [address]  Chief Complaint  Patient presents with  . Follow-up    HPI: 59 year old female with MS and urinary frequency who returns today for routine follow-up.  She was last seen about a year ago.   Her symptoms are well controlled on oxybutynin.  For financial reasons, she ended up taking oxybutynin 5 mg immediate release dosing twice daily which was effective.  She elected to transition back to oxybutynin 10 mg XL daily.  She feels like her bladder is well controlled on this medication with no significant urgency or frequency.  No UTIs this year.  No flank pain or kidney stone episodes.  No new diagnoses or significant medical changes this year.  She is under the care of a new neurologist and is slightly changed her medications.  No progressive or change in her neurological symptoms.   PMH: Past Medical History:  Diagnosis Date  . Abdominal pain, right upper quadrant   . Back pain   . Calculus of kidney 12/09/2013  . Chronic back pain    unspecified  . Chronic left shoulder pain 07/19/2015  . Complication of anesthesia   . Functional disorder of bladder    other  . Galactorrhea 11/26/2014   Chronic   . Hereditary and idiopathic neuropathy 08/19/2013  . HPV test positive   . Hypercholesteremia 08/19/2013  . Hypertension   . Incomplete bladder emptying   . Microscopic hematuria   . MS (multiple sclerosis) ([facility])   . Muscle spasticity 05/21/2014  . Nonspecific findings on examination of urine    other  . Osteopenia   . PONV (postoperative nausea and vomiting)   . Status post laparoscopic supracervical hysterectomy 11/26/2014  . Tobacco user 11/26/2014  . Wrist fracture     Surgical History: Past Surgical History:  Procedure Laterality Date  . bilateral tubal ligation  1996  . BREAST  CYST EXCISION Left 2002  . FRACTURE SURGERY    . KNEE SURGERY     right  . LAPAROSCOPIC SUPRACERVICAL HYSTERECTOMY  08/05/2013  . ORIF WRIST FRACTURE Left 01/17/2017   Procedure: OPEN REDUCTION INTERNAL FIXATION (ORIF) WRIST FRACTURE;  Surgeon: Lyndle Herrlich, MD;  Location: [facility];  Service: Orthopedics;  Laterality: Left;  . RADIOLOGY WITH ANESTHESIA N/A 03/18/2020   Procedure: MRI WITH ANESTHESIA CERVICAL SPINE AND BRAIN  WITH AND WITHOUT CONTRAST;  Surgeon: Radiologist, Medication, MD;  Location: [facility];  Service: Radiology;  Laterality: N/A;  . TUBAL LIGATION Bilateral   . VAGINAL HYSTERECTOMY  03/2006    Home Medications:  Allergies as of 04/06/2020      Reactions   Amlodipine Swelling   Povidone Iodine Rash      Medication List       Accurate as of April 06, 2020 12:49 PM. If you have any questions, ask your nurse or doctor.        STOP taking these medications   famotidine 20 MG tablet Commonly known as: PEPCID Stopped by: Vanna Scotland, MD   multivitamin with minerals Tabs tablet Stopped by: Vanna Scotland, MD     TAKE these medications   acetaminophen 500 MG tablet Commonly known as: TYLENOL Take 500-1,000 mg by mouth every 6 (six) hours as needed for moderate pain or headache.   baclofen 20 MG tablet Commonly known as: LIORESAL Take 1 tablet (20 mg  total) by mouth 3 (three) times daily.   EQL Vitamin D3 25 MCG (1000 UT) tablet Generic drug: Cholecalciferol Take 2,000 Units by mouth daily.   gabapentin 100 MG capsule Commonly known as: NEURONTIN Take 1 capsule (100 mg total) by mouth 2 (two) times daily.   ibandronate 150 MG tablet Commonly known as: BONIVA Take 150 mg by mouth every 30 (thirty) days.   Mayzent 2 MG Tabs Generic drug: Siponimod Fumarate Take 2 mg by mouth daily.   oxybutynin 10 MG 24 hr tablet Commonly known as: DITROPAN-XL Take 1 tablet (10 mg total) by mouth daily.   telmisartan 40 MG tablet Commonly known as:  MICARDIS Take 40 mg by mouth daily.       Allergies:  Allergies  Allergen Reactions  . Amlodipine Swelling  . Povidone Iodine Rash    Family History: Family History  Problem Relation Age of Onset  . Sickle cell trait Sister   . Hypertension Sister   . Supraventricular tachycardia Sister   . Hypertension Sister   . Diabetes Maternal Grandmother   . Liver cancer Maternal Grandmother   . Diabetes Maternal Aunt   . Breast cancer Maternal Aunt        great MAT  . Ovarian cancer Paternal Grandmother   . Nephrolithiasis Father   . Hypertension Father   . Prostate cancer Father   . Kidney disease Father   . Heart failure Father   . Hypertension Sister   . Osteoarthritis Mother   . Hypertension Mother   . Hyperlipidemia Mother   . GU problems Neg Hx   . Urolithiasis Neg Hx     Social History:  reports that she has quit smoking. Her smoking use included cigarettes. She smoked 0.50 packs per day. She has never used smokeless tobacco. She reports that she does not drink alcohol and does not use drugs.   Physical Exam: BP 138/80   Pulse 60   Constitutional:  Alert and oriented, No acute distress.  In wheelchair. HEENT: [place] AT, moist mucus membranes.  Trachea midline, no masses. Cardiovascular: No clubbing, cyanosis, or edema. Respiratory: Normal respiratory effort, no increased work of breathing. Skin: No rashes, bruises or suspicious lesions. Neurologic: Grossly intact, no focal deficits, moving all 4 extremities. Psychiatric: Normal mood and affect.  Laboratory Data: Lab Results  Component Value Date   WBC 6.9 03/01/2020   HGB 13.1 03/01/2020   HCT 40.6 03/01/2020   MCV 81 03/01/2020   PLT 227 03/01/2020    Lab Results  Component Value Date   CREATININE 0.78 03/18/2020     Lab Results  Component Value Date   HGBA1C 5.1 11/29/2016    Urinalysis    Component Value Date/Time   COLORURINE YELLOW (A) 08/03/2019 2250   APPEARANCEUR CLEAR (A) 08/03/2019 2250    APPEARANCEUR Cloudy 07/31/2011 1838   LABSPEC 1.034 (H) 08/03/2019 2250   LABSPEC 1.030 07/31/2011 1838   PHURINE 5.0 08/03/2019 2250   GLUCOSEU NEGATIVE 08/03/2019 2250   GLUCOSEU Negative 07/31/2011 1838   HGBUR NEGATIVE 08/03/2019 2250   BILIRUBINUR NEGATIVE 08/03/2019 2250   BILIRUBINUR Negative 07/31/2011 1838   KETONESUR 20 (A) 08/03/2019 2250   PROTEINUR NEGATIVE 08/03/2019 2250   NITRITE NEGATIVE 08/03/2019 2250   LEUKOCYTESUR NEGATIVE 08/03/2019 2250   LEUKOCYTESUR Negative 07/31/2011 1838    Lab Results  Component Value Date   BACTERIA NONE SEEN 08/03/2019    Pertinent Imaging: Results for orders placed or performed in visit on 04/06/20  Bladder Scan (  Post Void Residual) in office  Result Value Ref Range   Scan Result 10      Assessment & Plan:    1. OAB (overactive bladder) Symptoms stable on oxybutynin 10 mg XL without side effects  Adequate bladder emptying today  We will continue this regimen,   Follow up annually for medication refill or adjustments as needed - Bladder Scan (Post Void Residual) in office   Vanna Scotland, MD  [facility] [address] [place], [place] [address] [phone]

## 2020-04-06 NOTE — Therapy (Signed)
[place] MAIN [facility] SERVICES [address] [place], [place], [address] Phone: [phone]   Fax:  [phone]  Physical Therapy Treatment  Patient Details  Name: Lisa Williams MRN: [id number] Date of Birth: [birth year] Referring Provider (PT): Gurney Maxin, MD   Encounter Date: 04/06/2020   PT End of Session - 04/06/20 1253    Visit Number [id number]    Number of Visits 72    Date for PT Re-Evaluation 06/01/20    Authorization Type 3/10 PN 03/23/20    PT Start Time 1016    PT Stop Time 1059    PT Time Calculation (min) 43 min    Equipment Utilized During Treatment Gait belt;Other (comment)   bilateral AFOs   Activity Tolerance Patient tolerated treatment well    Behavior During Therapy [facility] for tasks assessed/performed           Past Medical History:  Diagnosis Date   Abdominal pain, right upper quadrant    Back pain    Calculus of kidney 12/09/2013   Chronic back pain    unspecified   Chronic left shoulder pain 1/65/5374   Complication of anesthesia    Functional disorder of bladder    other   Galactorrhea 11/26/2014   Chronic    Hereditary and idiopathic neuropathy 08/19/2013   HPV test positive    Hypercholesteremia 08/19/2013   Hypertension    Incomplete bladder emptying    Microscopic hematuria    MS (multiple sclerosis) (HCC)    Muscle spasticity 05/21/2014   Nonspecific findings on examination of urine    other   Osteopenia    PONV (postoperative nausea and vomiting)    Status post laparoscopic supracervical hysterectomy 11/26/2014   Tobacco user 11/26/2014   Wrist fracture     Past Surgical History:  Procedure Laterality Date   bilateral tubal ligation  1996   BREAST CYST EXCISION Left 2002   FRACTURE SURGERY     KNEE SURGERY     right   LAPAROSCOPIC SUPRACERVICAL HYSTERECTOMY  08/05/2013   ORIF WRIST FRACTURE Left 01/17/2017   Procedure: OPEN REDUCTION INTERNAL FIXATION (ORIF)  WRIST FRACTURE;  Surgeon: Lovell Sheehan, MD;  Location: [facility];  Service: Orthopedics;  Laterality: Left;   RADIOLOGY WITH ANESTHESIA N/A 03/18/2020   Procedure: MRI WITH ANESTHESIA CERVICAL SPINE AND BRAIN  WITH AND WITHOUT CONTRAST;  Surgeon: Radiologist, Medication, MD;  Location: [facility];  Service: Radiology;  Laterality: N/A;   TUBAL LIGATION Bilateral    VAGINAL HYSTERECTOMY  03/2006    There were no vitals filed for this visit.   Subjective Assessment - 04/06/20 1252    Subjective Patient reports compliance with HEP. Has a doctor appointment this afternoon. No falls or LOB since last session, her spasms have decreased    Pertinent History Patient is previous patient of this therapist, was admitted to the hospital on 08/03/19 for MS exacerbation and then went to inpatient rehab on 08/08/19. Patient then received home health therapy and now is returning to outpatient PT. PMh includes HTN, SVT, neurogenic bowel, MS (1995), neuropathy, hx of wrist fx, HPV, Hypercholesteremia, adiposity, osteopenia, spasticity. She wears bilateral AFOs and uses a RW and/or wheelchair for mobility. She drives an adapted car.    Limitations Lifting;Standing;Walking;House hold activities    How long can you sit comfortably? n/a    How long can you stand comfortably? 4 minutes    How long can you walk comfortably? walked 50 ft in inpatient rehab  Patient Stated Goals return to PLOF, increase walking, get into shower, increase mobility    Currently in Pain? No/denies             Standing in // bars:  -Standing weight shifts 10x each side; UE support focus on hip shift rather than pull with arm -Knee bends/ mini squat 5x with UE support and w/c behind, 2 sets -6" step  Bend over and reach, pick up cone and pass midline x2  -unilateral knee bends 8x each LE, challenging with LLE more than RLE  Seated:  BTB row 15x    isometric contraction against PT resistance: -hip flexion 10x 3 second hold -hip  abduction 10x 3 second hold -hip adduction 10x 3 second holds  Patient utilizes ice pack for reduction of internal temperature to reduce risk of MS exacerbation and allow continuation of progression of interventions.  Pt educated throughout session about proper posture and technique with exercises. Improved exercise technique, movement at target joints, use of target muscles after min to mod verbal, visual, tactile cues.                      PT Education - 04/06/20 1253    Education provided Yes    Education Details exercise technique, body mechanics    Person(s) Educated Patient    Methods Explanation;Demonstration;Tactile cues;Verbal cues    Comprehension Verbalized understanding;Returned demonstration;Verbal cues required;Tactile cues required            PT Short Term Goals - 02/10/20 1110      PT SHORT TERM GOAL #1   Title Patient will be independent in home exercise program to improve strength/mobility for better functional independence with ADLs.    Baseline 5/19: HEP next session 6/22: HEP compliant 8/10 : HEP progressions compliant 8/31 HEP compliant 10/5: HEP compliant    Time 4    Period Weeks    Status Achieved    Target Date 01/13/20      PT SHORT TERM GOAL #2   Title Patient will perform a sit to stand with SUE support for increased ind. with transfers.    Baseline 5/19: heavy BUE support 6/22: requires one hand on walker one hand on w/c 8/10: able to perform intermittently (some days can some days cannot) 8/31: attempt next session 10/5: BUE reliance, patient unable to complete 4 full stands. Attempt next session    Time 4    Period Weeks    Status Partially Met    Target Date 01/13/20             PT Long Term Goals - 03/09/20 0001      PT LONG TERM GOAL #1   Title Patient will increase FOTO score to equal to or greater than  53/100   to demonstrate statistically significant improvement in mobility and quality of life.     Baseline  8/31: 48.13% 7/27:  48.1% 6/22: 46.99% 5/18: 48/100 10/5: 49/100 11/2: 48%     Time 12    Period Weeks    Status Partially Met    Target Date 06/01/20      PT LONG TERM GOAL #2   Title Patient (< 35 years old) will complete five times sit to stand test in < 20 seconds indicating an increased LE strength and improved balance.    Baseline 7/27: 26.8 seconds with one partial stand 6/22: 40. 86 sec; unable to reach full stand last two attempts 5/19: 42 seconds 10/5: BUE reliance,  patient unable to complete 4 full stands. Attempt next session 11/2: 23.71 seconds    Time 12    Status Partially Met    Target Date 06/01/20      PT LONG TERM GOAL #3   Title Patient will increase 10 meter walk test to <20 seconds as to improve gait speed for better community ambulation and to reduce fall risk.    Baseline  8/31: 35.1 seconds with RW 7/27: 36 seconds with RW 6/22: 36.1 seconds 5/19: 42 seconds 10/5: 31.9 seconds with RW 11/2: 29.9 seconds with RW     Time 12    Period Weeks    Status Partially Met    Target Date 06/01/20      PT LONG TERM GOAL #4   Title Patient will reduce timed up and go to <11 seconds to reduce fall risk and demonstrate improved transfer/gait ability.    Baseline  8/31: 50.8 seconds with RW 7/27: 43.65 6/22: 45.4 seconds 5/19: 47 seconds 10/5: 38.5 seconds with RW 11/2: 35.72 seconds with RW     Time 12    Period Weeks    Status Partially Met    Target Date 06/01/20      PT LONG TERM GOAL #5   Title Patient will ambulate from physical therapy gym to elevator down the hall, ride elevator to first floor, and walk to valet with RW and chair follow for increased capacity for mobility and ind.    Baseline 8/31: able to open door ~50 ft with one rest break 8/10: unable to ambulate to elevator without rest break 9/30: able to ambulate to elevator with 3 rest breaks    Time 12    Period Weeks    Status Partially Met    Target Date 06/01/20      PT LONG TERM GOAL #6   Title  Patient will be able to stand with RW and lift one leg onto 4" step for carryover to negotiation of steps at home.     Baseline 11/2: unable to perform     Time 12    Period Weeks    Status New    Target Date 06/01/20                 Plan - 04/06/20 1738    Clinical Impression Statement Patient progressing with standing balance and control of LE's; tolerating increased challenge well. Crossing midline is improving in ease with decreased need for loss of balance. Patient is challenged to perform unilateral LE movement without the opposite LE's involvement but will continue to focus on task for progression of ambulatory mechanics.  Patient continues to remain highly motivated throughout session despite spasms. The patient would benefit from further skilled PT intervention to maximize QOL, safety, and functional mobility    Personal Factors and Comorbidities Age;Comorbidity 3+;Fitness;Past/Current Experience;Sex;Time since onset of injury/illness/exacerbation    Comorbidities HTN, SVT, neurogenic bowel, MS (1995), neuropathy, hx of wrist fx, HPV, Hypercholesteremia, adiposity, osteopenia, spasticity.    Examination-Activity Limitations Bathing;Bed Mobility;Bend;Carry;Continence;Dressing;Hygiene/Grooming;Stairs;Squat;Reach Overhead;Locomotion Level;Lift;Stand;Transfers    Examination-Participation Restrictions Church;Cleaning;Community Activity;Laundry;Volunteer;Shop;Meal Prep;Yard Work    [employer] Evolving/Moderate complexity    Rehab Potential Good    PT Frequency 2x / week    PT Duration 12 weeks    PT Treatment/Interventions ADLs/Self Care Home Management;Aquatic Therapy;Biofeedback;Cryotherapy;Electrical Stimulation;Iontophoresis 61m/ml Dexamethasone;Traction;Moist Heat;Ultrasound;DME Instruction;Gait training;Stair training;Functional mobility training;Therapeutic activities;Therapeutic exercise;Balance training;Neuromuscular re-education;Patient/family  education;Orthotic Fit/Training;Wheelchair mobility training;Manual techniques;Compression bandaging;Passive range of motion;Dry needling;Energy conservation;Taping;Splinting;Vestibular    PT Next Visit  Plan step negotiation, turning directions to the L and R    PT Home Exercise Plan maintain current HEP from [place] and Agree with Plan of Care Patient           Patient will benefit from skilled therapeutic intervention in order to improve the following deficits and impairments:  Abnormal gait, Decreased activity tolerance, Decreased balance, Decreased coordination, Decreased endurance, Decreased mobility, Decreased range of motion, Decreased strength, Difficulty walking, Impaired perceived functional ability, Impaired sensation, Impaired tone, Impaired UE functional use, Improper body mechanics, Postural dysfunction, Cardiopulmonary status limiting activity, Impaired flexibility, Increased muscle spasms  Visit Diagnosis: Multiple sclerosis exacerbation (HCC)  Muscle weakness (generalized)  Other abnormalities of gait and mobility  Unsteadiness on feet     Problem List Patient Active Problem List   Diagnosis Date Noted   Hypoalbuminemia due to protein-calorie malnutrition (HCC)    Neurogenic bowel    Neurogenic bladder    Labile blood pressure    Neuropathic pain    Abscess of female pelvis    SVT (supraventricular tachycardia) (HCC)    Radial styloid tenosynovitis 03/12/2018   Wheelchair confinement 02/27/2018   Localized osteoporosis with current pathological fracture with routine healing 01/19/2017   Wrist fracture 01/16/2017   Sprain of ankle 03/23/2016   Closed fracture of lateral malleolus 03/16/2016   Health care maintenance 01/24/2016   Blood pressure elevated without history of HTN 10/25/2015   Essential hypertension 10/25/2015   Multiple sclerosis ([place]) 10/02/2015   Chronic left shoulder pain 07/19/2015   Multiple sclerosis  exacerbation ([place]) 07/14/2015   MS (multiple sclerosis) ([place]) 11/26/2014   Increased body mass index 11/26/2014   HPV test positive 11/26/2014   Status post laparoscopic supracervical hysterectomy 11/26/2014   Galactorrhea 11/26/2014   Back ache 05/21/2014   Adiposity 05/21/2014   Disordered sleep 05/21/2014   Muscle spasticity 05/21/2014   Spasticity 05/21/2014   Calculus of kidney 35/78/9784   Renal colic 78/41/2820   Hypercholesteremia 08/19/2013   Hereditary and idiopathic neuropathy 08/19/2013   Hypercholesterolemia without hypertriglyceridemia 08/19/2013   Bladder infection, chronic 07/25/2012   Disorder of bladder function 07/25/2012   Incomplete bladder emptying 07/25/2012   Microscopic hematuria 07/25/2012   Right upper quadrant pain 07/25/2012   Janna Arch, PT, DPT   04/06/2020, 5:39 PM  [facility] MAIN [facility] SERVICES [address] [place], [place], [address] Phone: [phone]   Fax:  [phone]  Name: Lisa Williams MRN: [id number] Date of Birth: 08-Jun-1960

## 2020-04-08 ENCOUNTER — Other Ambulatory Visit: Payer: Self-pay | Admitting: Neurology

## 2020-04-08 ENCOUNTER — Ambulatory Visit: Payer: PPO | Attending: Neurology

## 2020-04-08 ENCOUNTER — Other Ambulatory Visit: Payer: Self-pay

## 2020-04-08 DIAGNOSIS — M5412 Radiculopathy, cervical region: Secondary | ICD-10-CM | POA: Diagnosis not present

## 2020-04-08 DIAGNOSIS — R2689 Other abnormalities of gait and mobility: Secondary | ICD-10-CM | POA: Insufficient documentation

## 2020-04-08 DIAGNOSIS — M6281 Muscle weakness (generalized): Secondary | ICD-10-CM | POA: Insufficient documentation

## 2020-04-08 DIAGNOSIS — G35 Multiple sclerosis: Secondary | ICD-10-CM | POA: Insufficient documentation

## 2020-04-08 DIAGNOSIS — R2681 Unsteadiness on feet: Secondary | ICD-10-CM | POA: Diagnosis not present

## 2020-04-08 MED ORDER — GABAPENTIN 100 MG PO CAPS
100.0000 mg | ORAL_CAPSULE | Freq: Two times a day (BID) | ORAL | 3 refills | Status: DC
Start: 2020-04-08 — End: 2021-06-06

## 2020-04-08 MED ORDER — BACLOFEN 20 MG PO TABS: 20.0000 mg | ORAL_TABLET | Freq: Three times a day (TID) | ORAL | 3 refills | Status: DC

## 2020-04-08 NOTE — Therapy (Signed)
[place] MAIN [facility] SERVICES [address] [place], [place], [address] Phone: [phone]   Fax:  [phone]  Physical Therapy Treatment  Patient Details  Name: Lisa Williams MRN: [id number] Date of Birth: 1960-07-01 Referring Provider (PT): Gurney Maxin, MD   Encounter Date: 04/08/2020   PT End of Session - 04/08/20 1354    Visit Number [id number]    Number of Visits 72    Date for PT Re-Evaluation 06/01/20    Authorization Type 4/10 PN 03/23/20    PT Start Time 1015    PT Stop Time 1058    PT Time Calculation (min) 43 min    Equipment Utilized During Treatment Gait belt;Other (comment)   bilateral AFOs   Activity Tolerance Patient tolerated treatment well    Behavior During Therapy WFL for tasks assessed/performed           Past Medical History:  Diagnosis Date  . Abdominal pain, right upper quadrant   . Back pain   . Calculus of kidney 12/09/2013  . Chronic back pain    unspecified  . Chronic left shoulder pain 07/19/2015  . Complication of anesthesia   . Functional disorder of bladder    other  . Galactorrhea 11/26/2014   Chronic   . Hereditary and idiopathic neuropathy 08/19/2013  . HPV test positive   . Hypercholesteremia 08/19/2013  . Hypertension   . Incomplete bladder emptying   . Microscopic hematuria   . MS (multiple sclerosis) ([place])   . Muscle spasticity 05/21/2014  . Nonspecific findings on examination of urine    other  . Osteopenia   . PONV (postoperative nausea and vomiting)   . Status post laparoscopic supracervical hysterectomy 11/26/2014  . Tobacco user 11/26/2014  . Wrist fracture     Past Surgical History:  Procedure Laterality Date  . bilateral tubal ligation  1996  . BREAST CYST EXCISION Left 2002  . FRACTURE SURGERY    . KNEE SURGERY     right  . LAPAROSCOPIC SUPRACERVICAL HYSTERECTOMY  08/05/2013  . ORIF WRIST FRACTURE Left 01/17/2017   Procedure: OPEN REDUCTION INTERNAL FIXATION (ORIF)  WRIST FRACTURE;  Surgeon: Lovell Sheehan, MD;  Location: [facility];  Service: Orthopedics;  Laterality: Left;  . RADIOLOGY WITH ANESTHESIA N/A 03/18/2020   Procedure: MRI WITH ANESTHESIA CERVICAL SPINE AND BRAIN  WITH AND WITHOUT CONTRAST;  Surgeon: Radiologist, Medication, MD;  Location: [place];  Service: Radiology;  Laterality: N/A;  . TUBAL LIGATION Bilateral   . VAGINAL HYSTERECTOMY  03/2006    There were no vitals filed for this visit.   Subjective Assessment - 04/08/20 1353    Subjective Patient reports compliance with HEP. Had a good doctor appointment after last session. Will start her new medication for MS on saturday.    Pertinent History Patient is previous patient of this therapist, was admitted to the hospital on 08/03/19 for MS exacerbation and then went to inpatient rehab on 08/08/19. Patient then received home health therapy and now is returning to outpatient PT. PMh includes HTN, SVT, neurogenic bowel, MS (1995), neuropathy, hx of wrist fx, HPV, Hypercholesteremia, adiposity, osteopenia, spasticity. She wears bilateral AFOs and uses a RW and/or wheelchair for mobility. She drives an adapted car.    Limitations Lifting;Standing;Walking;House hold activities    How long can you sit comfortably? n/a    How long can you stand comfortably? 4 minutes    How long can you walk comfortably? walked 50 ft in inpatient rehab  Patient Stated Goals return to PLOF, increase walking, get into shower, increase mobility    Currently in Pain? No/denies            Gait: Patient ambulates 79f, seated rest break then additional 40 ft with RW. CGA and wheelchair follow for safety. Patient able to navigate opening doors, turns, and thresholds independently but with inc time to perform. Patient demonstrates inc reliance on BUE by end of gait trial.   Seated: Sit to stands 5x with focus on eccentric control and keeping knees apart.  Cross body rotation 2x 60 seconds for core activation and  stabilization in midline.  Hand clap with PT for core activation, sequencing, dual task, and UE strengthening x 45 seconds x 3 trials Posterior trunk lean 4x 20 second holds  Pectoral stretch combined with arnold's 15x GTB row 15x  GTB bilateral ER 15x      Ice pack donned throughout session to minimize exacerbation of MS symptoms in order to perform interventions.    Pt educated throughout session about proper posture and technique with exercises. Improved exercise technique, movement at target joints, use of target muscles after min to mod verbal, visual, tactile cues.                         PT Education - 04/08/20 1353    Education provided Yes    Education Details exercise technique, body mechanics, gait negotiation    Person(s) Educated Patient    Methods Explanation;Demonstration;Tactile cues;Verbal cues    Comprehension Verbalized understanding;Returned demonstration;Verbal cues required;Tactile cues required            PT Short Term Goals - 02/10/20 1110      PT SHORT TERM GOAL #1   Title Patient will be independent in home exercise program to improve strength/mobility for better functional independence with ADLs.    Baseline 5/19: HEP next session 6/22: HEP compliant 8/10 : HEP progressions compliant 8/31 HEP compliant 10/5: HEP compliant    Time 4    Period Weeks    Status Achieved    Target Date 01/13/20      PT SHORT TERM GOAL #2   Title Patient will perform a sit to stand with SUE support for increased ind. with transfers.    Baseline 5/19: heavy BUE support 6/22: requires one hand on walker one hand on w/c 8/10: able to perform intermittently (some days can some days cannot) 8/31: attempt next session 10/5: BUE reliance, patient unable to complete 4 full stands. Attempt next session    Time 4    Period Weeks    Status Partially Met    Target Date 01/13/20             PT Long Term Goals - 03/09/20 0001      PT LONG TERM GOAL #1    Title Patient will increase FOTO score to equal to or greater than  53/100   to demonstrate statistically significant improvement in mobility and quality of life.     Baseline 8/31: 48.13% 7/27:  48.1% 6/22: 46.99% 5/18: 48/100 10/5: 49/100 11/2: 48%     Time 12    Period Weeks    Status Partially Met    Target Date 06/01/20      PT LONG TERM GOAL #2   Title Patient (< 639years old) will complete five times sit to stand test in < 20 seconds indicating an increased LE strength and improved balance.  Baseline 7/27: 26.8 seconds with one partial stand 6/22: 40. 86 sec; unable to reach full stand last two attempts 5/19: 42 seconds 10/5: BUE reliance, patient unable to complete 4 full stands. Attempt next session 11/2: 23.71 seconds    Time 12    Status Partially Met    Target Date 06/01/20      PT LONG TERM GOAL #3   Title Patient will increase 10 meter walk test to <20 seconds as to improve gait speed for better community ambulation and to reduce fall risk.    Baseline  8/31: 35.1 seconds with RW 7/27: 36 seconds with RW 6/22: 36.1 seconds 5/19: 42 seconds 10/5: 31.9 seconds with RW 11/2: 29.9 seconds with RW     Time 12    Period Weeks    Status Partially Met    Target Date 06/01/20      PT LONG TERM GOAL #4   Title Patient will reduce timed up and go to <11 seconds to reduce fall risk and demonstrate improved transfer/gait ability.    Baseline  8/31: 50.8 seconds with RW 7/27: 43.65 6/22: 45.4 seconds 5/19: 47 seconds 10/5: 38.5 seconds with RW 11/2: 35.72 seconds with RW     Time 12    Period Weeks    Status Partially Met    Target Date 06/01/20      PT LONG TERM GOAL #5   Title Patient will ambulate from physical therapy gym to elevator down the hall, ride elevator to first floor, and walk to valet with RW and chair follow for increased capacity for mobility and ind.    Baseline 8/31: able to open door ~50 ft with one rest break 8/10: unable to ambulate to elevator without rest  break 9/30: able to ambulate to elevator with 3 rest breaks    Time 12    Period Weeks    Status Partially Met    Target Date 06/01/20      PT LONG TERM GOAL #6   Title Patient will be able to stand with RW and lift one leg onto 4" step for carryover to negotiation of steps at home.     Baseline 11/2: unable to perform     Time 12    Period Weeks    Status New    Target Date 06/01/20                 Plan - 04/08/20 1357    Clinical Impression Statement Patient is progressing with functional ambulation , negotiation multiple turns and having to push open doors prior to needing rest break. Patient fatigued by end of ambulation requiring rest break. She then participated in sit to stands with focus on slow eccentric control and keeping knees apart for optimal LE recruitment and reduction on reliance upon UE's. The patient would benefit from further skilled PT intervention to maximize QOL, safety, and functional mobility    Personal Factors and Comorbidities Age;Comorbidity 3+;Fitness;Past/Current Experience;Sex;Time since onset of injury/illness/exacerbation    Comorbidities HTN, SVT, neurogenic bowel, MS (1995), neuropathy, hx of wrist fx, HPV, Hypercholesteremia, adiposity, osteopenia, spasticity.    Examination-Activity Limitations Bathing;Bed Mobility;Bend;Carry;Continence;Dressing;Hygiene/Grooming;Stairs;Squat;Reach Overhead;Locomotion Level;Lift;Stand;Transfers    Examination-Participation Restrictions Church;Cleaning;Community Activity;Laundry;Volunteer;Shop;Meal Prep;Yard Work    [employer] Evolving/Moderate complexity    Rehab Potential Good    PT Frequency 2x / week    PT Duration 12 weeks    PT Treatment/Interventions ADLs/Self Care Home Management;Aquatic Therapy;Biofeedback;Cryotherapy;Electrical Stimulation;Iontophoresis 79m/ml Dexamethasone;Traction;Moist Heat;Ultrasound;DME Instruction;Gait training;Stair training;Functional mobility  training;Therapeutic  activities;Therapeutic exercise;Balance training;Neuromuscular re-education;Patient/family education;Orthotic Fit/Training;Wheelchair mobility training;Manual techniques;Compression bandaging;Passive range of motion;Dry needling;Energy conservation;Taping;Splinting;Vestibular    PT Next Visit Plan step negotiation, turning directions to the L and R    PT Home Exercise Plan maintain current HEP from HHPT    Consulted and Agree with Plan of Care Patient           Patient will benefit from skilled therapeutic intervention in order to improve the following deficits and impairments:  Abnormal gait, Decreased activity tolerance, Decreased balance, Decreased coordination, Decreased endurance, Decreased mobility, Decreased range of motion, Decreased strength, Difficulty walking, Impaired perceived functional ability, Impaired sensation, Impaired tone, Impaired UE functional use, Improper body mechanics, Postural dysfunction, Cardiopulmonary status limiting activity, Impaired flexibility, Increased muscle spasms  Visit Diagnosis: Multiple sclerosis exacerbation ([facility])  Muscle weakness (generalized)  Other abnormalities of gait and mobility  Unsteadiness on feet     Problem List Patient Active Problem List   Diagnosis Date Noted  . Hypoalbuminemia due to protein-calorie malnutrition ([place])   . Neurogenic bowel   . Neurogenic bladder   . Labile blood pressure   . Neuropathic pain   . Abscess of female pelvis   . SVT (supraventricular tachycardia) ([place])   . Radial styloid tenosynovitis 03/12/2018  . Wheelchair confinement 02/27/2018  . Localized osteoporosis with current pathological fracture with routine healing 01/19/2017  . Wrist fracture 01/16/2017  . Sprain of ankle 03/23/2016  . Closed fracture of lateral malleolus 03/16/2016  . Health care maintenance 01/24/2016  . Blood pressure elevated without history of HTN 10/25/2015  . Essential hypertension 10/25/2015  .  Multiple sclerosis ([place]) 10/02/2015  . Chronic left shoulder pain 07/19/2015  . Multiple sclerosis exacerbation ([place]) 07/14/2015  . MS (multiple sclerosis) ([place]) 11/26/2014  . Increased body mass index 11/26/2014  . HPV test positive 11/26/2014  . Status post laparoscopic supracervical hysterectomy 11/26/2014  . Galactorrhea 11/26/2014  . Back ache 05/21/2014  . Adiposity 05/21/2014  . Disordered sleep 05/21/2014  . Muscle spasticity 05/21/2014  . Spasticity 05/21/2014  . Calculus of kidney 12/09/2013  . Renal colic 14/38/8875  . Hypercholesteremia 08/19/2013  . Hereditary and idiopathic neuropathy 08/19/2013  . Hypercholesterolemia without hypertriglyceridemia 08/19/2013  . Bladder infection, chronic 07/25/2012  . Disorder of bladder function 07/25/2012  . Incomplete bladder emptying 07/25/2012  . Microscopic hematuria 07/25/2012  . Right upper quadrant pain 07/25/2012   Janna Arch, PT, DPT   04/08/2020, 1:59 PM  [place] MAIN [facility] SERVICES [address] [place], [place], [address] Phone: [phone]   Fax:  [phone]  Name: Lisa Williams MRN: [id number] Date of Birth: 1961-04-22

## 2020-04-13 ENCOUNTER — Ambulatory Visit: Payer: PPO

## 2020-04-13 ENCOUNTER — Encounter: Payer: Self-pay | Admitting: Physical Therapy

## 2020-04-13 ENCOUNTER — Other Ambulatory Visit: Payer: Self-pay

## 2020-04-13 DIAGNOSIS — R2681 Unsteadiness on feet: Secondary | ICD-10-CM

## 2020-04-13 DIAGNOSIS — M6281 Muscle weakness (generalized): Secondary | ICD-10-CM

## 2020-04-13 DIAGNOSIS — G35 Multiple sclerosis: Secondary | ICD-10-CM

## 2020-04-13 DIAGNOSIS — R2689 Other abnormalities of gait and mobility: Secondary | ICD-10-CM

## 2020-04-13 DIAGNOSIS — M5412 Radiculopathy, cervical region: Secondary | ICD-10-CM

## 2020-04-13 NOTE — Therapy (Signed)
[place] MAIN [facility] SERVICES [address] [place], [place], [address] Phone: [phone]   Fax:  [phone]  Physical Therapy Treatment  Patient Details  Name: Lisa Williams MRN: [id number] Date of Birth: 31-Jul-1960 Referring Provider (PT): Gurney Maxin, MD   Encounter Date: 04/13/2020   PT End of Session - 04/13/20 1059    Visit Number 55    Number of Visits 72    Date for PT Re-Evaluation 06/01/20    Authorization Type 5/10 PN 03/23/20    PT Start Time 1015    PT Stop Time 1055    PT Time Calculation (min) 40 min    Equipment Utilized During Treatment Gait belt;Other (comment)   ice pack   Activity Tolerance Patient tolerated treatment well    Behavior During Therapy WFL for tasks assessed/performed           Past Medical History:  Diagnosis Date  . Abdominal pain, right upper quadrant   . Back pain   . Calculus of kidney 12/09/2013  . Chronic back pain    unspecified  . Chronic left shoulder pain 07/19/2015  . Complication of anesthesia   . Functional disorder of bladder    other  . Galactorrhea 11/26/2014   Chronic   . Hereditary and idiopathic neuropathy 08/19/2013  . HPV test positive   . Hypercholesteremia 08/19/2013  . Hypertension   . Incomplete bladder emptying   . Microscopic hematuria   . MS (multiple sclerosis) ([place])   . Muscle spasticity 05/21/2014  . Nonspecific findings on examination of urine    other  . Osteopenia   . PONV (postoperative nausea and vomiting)   . Status post laparoscopic supracervical hysterectomy 11/26/2014  . Tobacco user 11/26/2014  . Wrist fracture     Past Surgical History:  Procedure Laterality Date  . bilateral tubal ligation  1996  . BREAST CYST EXCISION Left 2002  . FRACTURE SURGERY    . KNEE SURGERY     right  . LAPAROSCOPIC SUPRACERVICAL HYSTERECTOMY  08/05/2013  . ORIF WRIST FRACTURE Left 01/17/2017   Procedure: OPEN REDUCTION INTERNAL FIXATION (ORIF) WRIST  FRACTURE;  Surgeon: Lovell Sheehan, MD;  Location: [facility];  Service: Orthopedics;  Laterality: Left;  . RADIOLOGY WITH ANESTHESIA N/A 03/18/2020   Procedure: MRI WITH ANESTHESIA CERVICAL SPINE AND BRAIN  WITH AND WITHOUT CONTRAST;  Surgeon: Radiologist, Medication, MD;  Location: [facility];  Service: Radiology;  Laterality: N/A;  . TUBAL LIGATION Bilateral   . VAGINAL HYSTERECTOMY  03/2006    There were no vitals filed for this visit.   Subjective Assessment - 04/13/20 1058    Subjective Patient reported that she is doing well today, and have been pleased with her functional improvements lately. No issues with her new medication, but did report HR in the 50s.    Patient is accompained by: Family member    Pertinent History Patient is previous patient of this therapist, was admitted to the hospital on 08/03/19 for MS exacerbation and then went to inpatient rehab on 08/08/19. Patient then received home health therapy and now is returning to outpatient PT. PMh includes HTN, SVT, neurogenic bowel, MS (1995), neuropathy, hx of wrist fx, HPV, Hypercholesteremia, adiposity, osteopenia, spasticity. She wears bilateral AFOs and uses a RW and/or wheelchair for mobility. She drives an adapted car.    Limitations Lifting;Standing;Walking;House hold activities    How long can you sit comfortably? n/a    How long can you stand comfortably?  4 minutes    How long can you walk comfortably? walked 50 ft in inpatient rehab    Currently in Pain? No/denies            TREATMENT: new med, day 4 of it.  Watch HR: 69 at start of session  Standing in // bars:    -Standing weight shifts 20x x 2 bouts each side; UE support focus on hip shift rather than pull with arm   -Knee bends/ mini squat 5x x 2 bouts with UE support and w/c behind, 2 sets   -6" step turned vertically  Bend over and reach, pick up cone (4 cones) and pass midline x2    -unilateral knee bends 8x each LE, challenging with LLE more than RLE         Seated:    BTB row 15x    isometric contraction against PT resistance:   -hip flexion 10x 3 second hold   -hip abduction 10x 3 second hold   -hip adduction 10x 3 second holds    Seated isometric holds of core activation without back support multidirectional 2x20 pushes     Patient utilizes ice pack for reduction of internal temperature to reduce risk of MS exacerbation and allow continuation of progression of interventions.        Pt educated throughout session about proper posture and technique with exercises. Improved exercise technique, movement at target joints, use of target muscles after min to mod verbal, visual, tactile cues.     pt response/clinical impression: Pt exhibited fatigue with standing exercises today, mild buckling noted with reaching attempts during second bout but pt able to safely finish the exercise. Pt reported good muscle work with core activation as well. Overall the patient continues to show progression towards goals and would benefit from further skilled PT.     PT Education - 04/13/20 1059    Education provided Yes    Education Details exercise technique    Person(s) Educated Patient    Methods Explanation;Demonstration;Tactile cues;Verbal cues    Comprehension Verbalized understanding;Returned demonstration;Verbal cues required;Tactile cues required            PT Short Term Goals - 02/10/20 1110      PT SHORT TERM GOAL #1   Title Patient will be independent in home exercise program to improve strength/mobility for better functional independence with ADLs.    Baseline 5/19: HEP next session 6/22: HEP compliant 8/10 : HEP progressions compliant 8/31 HEP compliant 10/5: HEP compliant    Time 4    Period Weeks    Status Achieved    Target Date 01/13/20      PT SHORT TERM GOAL #2   Title Patient will perform a sit to stand with SUE support for increased ind. with transfers.    Baseline 5/19: heavy BUE support 6/22: requires one hand on  walker one hand on w/c 8/10: able to perform intermittently (some days can some days cannot) 8/31: attempt next session 10/5: BUE reliance, patient unable to complete 4 full stands. Attempt next session    Time 4    Period Weeks    Status Partially Met    Target Date 01/13/20             PT Long Term Goals - 03/09/20 0001      PT LONG TERM GOAL #1   Title Patient will increase FOTO score to equal to or greater than  53/100   to demonstrate statistically significant improvement in  mobility and quality of life.     Baseline 8/31: 48.13% 7/27:  48.1% 6/22: 46.99% 5/18: 48/100 10/5: 49/100 11/2: 48%     Time 12    Period Weeks    Status Partially Met    Target Date 06/01/20      PT LONG TERM GOAL #2   Title Patient (< 57 years old) will complete five times sit to stand test in < 20 seconds indicating an increased LE strength and improved balance.    Baseline 7/27: 26.8 seconds with one partial stand 6/22: 40. 86 sec; unable to reach full stand last two attempts 5/19: 42 seconds 10/5: BUE reliance, patient unable to complete 4 full stands. Attempt next session 11/2: 23.71 seconds    Time 12    Status Partially Met    Target Date 06/01/20      PT LONG TERM GOAL #3   Title Patient will increase 10 meter walk test to <20 seconds as to improve gait speed for better community ambulation and to reduce fall risk.    Baseline  8/31: 35.1 seconds with RW 7/27: 36 seconds with RW 6/22: 36.1 seconds 5/19: 42 seconds 10/5: 31.9 seconds with RW 11/2: 29.9 seconds with RW     Time 12    Period Weeks    Status Partially Met    Target Date 06/01/20      PT LONG TERM GOAL #4   Title Patient will reduce timed up and go to <11 seconds to reduce fall risk and demonstrate improved transfer/gait ability.    Baseline  8/31: 50.8 seconds with RW 7/27: 43.65 6/22: 45.4 seconds 5/19: 47 seconds 10/5: 38.5 seconds with RW 11/2: 35.72 seconds with RW     Time 12    Period Weeks    Status Partially Met     Target Date 06/01/20      PT LONG TERM GOAL #5   Title Patient will ambulate from physical therapy gym to elevator down the hall, ride elevator to first floor, and walk to valet with RW and chair follow for increased capacity for mobility and ind.    Baseline 8/31: able to open door ~50 ft with one rest break 8/10: unable to ambulate to elevator without rest break 9/30: able to ambulate to elevator with 3 rest breaks    Time 12    Period Weeks    Status Partially Met    Target Date 06/01/20      PT LONG TERM GOAL #6   Title Patient will be able to stand with RW and lift one leg onto 4" step for carryover to negotiation of steps at home.     Baseline 11/2: unable to perform     Time 12    Period Weeks    Status New    Target Date 06/01/20                 Plan - 04/13/20 1059    Personal Factors and Comorbidities Age;Comorbidity 3+;Fitness;Past/Current Experience;Sex;Time since onset of injury/illness/exacerbation    Comorbidities HTN, SVT, neurogenic bowel, MS (1995), neuropathy, hx of wrist fx, HPV, Hypercholesteremia, adiposity, osteopenia, spasticity.    Examination-Activity Limitations Bathing;Bed Mobility;Bend;Carry;Continence;Dressing;Hygiene/Grooming;Stairs;Squat;Reach Overhead;Locomotion Level;Lift;Stand;Transfers    Examination-Participation Restrictions Church;Cleaning;Community Activity;Laundry;Volunteer;Shop;Meal Prep;Yard Work    [employer] Evolving/Moderate complexity    Rehab Potential Good    PT Frequency 2x / week    PT Duration 12 weeks    PT Treatment/Interventions ADLs/Self Care Home Management;Aquatic Therapy;Biofeedback;Cryotherapy;Electrical Stimulation;Iontophoresis 82m/ml  Dexamethasone;Traction;Moist Heat;Ultrasound;DME Instruction;Gait training;Stair training;Functional mobility training;Therapeutic activities;Therapeutic exercise;Balance training;Neuromuscular re-education;Patient/family education;Orthotic Fit/Training;Wheelchair  mobility training;Manual techniques;Compression bandaging;Passive range of motion;Dry needling;Energy conservation;Taping;Splinting;Vestibular    PT Next Visit Plan step negotiation, turning directions to the L and R    PT Home Exercise Plan maintain current HEP from HHPT    Consulted and Agree with Plan of Care Patient           Patient will benefit from skilled therapeutic intervention in order to improve the following deficits and impairments:  Abnormal gait, Decreased activity tolerance, Decreased balance, Decreased coordination, Decreased endurance, Decreased mobility, Decreased range of motion, Decreased strength, Difficulty walking, Impaired perceived functional ability, Impaired sensation, Impaired tone, Impaired UE functional use, Improper body mechanics, Postural dysfunction, Cardiopulmonary status limiting activity, Impaired flexibility, Increased muscle spasms  Visit Diagnosis: Multiple sclerosis exacerbation ([facility])  Muscle weakness (generalized)  Other abnormalities of gait and mobility  Unsteadiness on feet  Radiculopathy, cervical region     Problem List Patient Active Problem List   Diagnosis Date Noted  . Hypoalbuminemia due to protein-calorie malnutrition ([place])   . Neurogenic bowel   . Neurogenic bladder   . Labile blood pressure   . Neuropathic pain   . Abscess of female pelvis   . SVT (supraventricular tachycardia) ([place])   . Radial styloid tenosynovitis 03/12/2018  . Wheelchair confinement 02/27/2018  . Localized osteoporosis with current pathological fracture with routine healing 01/19/2017  . Wrist fracture 01/16/2017  . Sprain of ankle 03/23/2016  . Closed fracture of lateral malleolus 03/16/2016  . Health care maintenance 01/24/2016  . Blood pressure elevated without history of HTN 10/25/2015  . Essential hypertension 10/25/2015  . Multiple sclerosis ([place]) 10/02/2015  . Chronic left shoulder pain 07/19/2015  . Multiple sclerosis exacerbation ([place])  07/14/2015  . MS (multiple sclerosis) ([place]) 11/26/2014  . Increased body mass index 11/26/2014  . HPV test positive 11/26/2014  . Status post laparoscopic supracervical hysterectomy 11/26/2014  . Galactorrhea 11/26/2014  . Back ache 05/21/2014  . Adiposity 05/21/2014  . Disordered sleep 05/21/2014  . Muscle spasticity 05/21/2014  . Spasticity 05/21/2014  . Calculus of kidney 12/09/2013  . Renal colic 35/82/5189  . Hypercholesteremia 08/19/2013  . Hereditary and idiopathic neuropathy 08/19/2013  . Hypercholesterolemia without hypertriglyceridemia 08/19/2013  . Bladder infection, chronic 07/25/2012  . Disorder of bladder function 07/25/2012  . Incomplete bladder emptying 07/25/2012  . Microscopic hematuria 07/25/2012  . Right upper quadrant pain 07/25/2012    Lieutenant Diego PT, DPT 1:06 PM,04/13/20   [place] MAIN [facility] SERVICES [address] [place], [place], [address] Phone: [phone]   Fax:  [phone]  Name: Tychelle Purkey Carswell MRN: [id number] Date of Birth: 02/07/1961

## 2020-04-14 NOTE — Telephone Encounter (Signed)
I called pt. She received her mayznet shipment in the mail. She starts the maintenance dose tomorrow. She has had no adverse side effects from [facility] and seems to be tolerating it well. Pt will call us for questions or concerns.

## 2020-04-15 ENCOUNTER — Ambulatory Visit: Payer: PPO

## 2020-04-15 ENCOUNTER — Other Ambulatory Visit: Payer: Self-pay

## 2020-04-15 DIAGNOSIS — M6281 Muscle weakness (generalized): Secondary | ICD-10-CM

## 2020-04-15 DIAGNOSIS — R2681 Unsteadiness on feet: Secondary | ICD-10-CM

## 2020-04-15 DIAGNOSIS — R2689 Other abnormalities of gait and mobility: Secondary | ICD-10-CM

## 2020-04-15 DIAGNOSIS — G35 Multiple sclerosis: Secondary | ICD-10-CM | POA: Diagnosis not present

## 2020-04-15 NOTE — Therapy (Signed)
[place] MAIN [facility] SERVICES [address] [place], [place], [address] Phone: [phone]   Fax:  [phone]  Physical Therapy Treatment  Patient Details  Name: Lisa Williams MRN: [id number] Date of Birth: 12-02-1960 Referring Provider (PT): Gurney Maxin, MD   Encounter Date: 04/15/2020   PT End of Session - 04/15/20 1215    Visit Number [id number]    Number of Visits 72    Date for PT Re-Evaluation 06/01/20    Authorization Type 6/10 PN 03/23/20    PT Start Time 1015    PT Stop Time 1058    PT Time Calculation (min) 43 min    Equipment Utilized During Treatment Gait belt;Other (comment)   ice pack   Activity Tolerance Patient tolerated treatment well    Behavior During Therapy WFL for tasks assessed/performed           Past Medical History:  Diagnosis Date  . Abdominal pain, right upper quadrant   . Back pain   . Calculus of kidney 12/09/2013  . Chronic back pain    unspecified  . Chronic left shoulder pain 07/19/2015  . Complication of anesthesia   . Functional disorder of bladder    other  . Galactorrhea 11/26/2014   Chronic   . Hereditary and idiopathic neuropathy 08/19/2013  . HPV test positive   . Hypercholesteremia 08/19/2013  . Hypertension   . Incomplete bladder emptying   . Microscopic hematuria   . MS (multiple sclerosis) ([place])   . Muscle spasticity 05/21/2014  . Nonspecific findings on examination of urine    other  . Osteopenia   . PONV (postoperative nausea and vomiting)   . Status post laparoscopic supracervical hysterectomy 11/26/2014  . Tobacco user 11/26/2014  . Wrist fracture     Past Surgical History:  Procedure Laterality Date  . bilateral tubal ligation  1996  . BREAST CYST EXCISION Left 2002  . FRACTURE SURGERY    . KNEE SURGERY     right  . LAPAROSCOPIC SUPRACERVICAL HYSTERECTOMY  08/05/2013  . ORIF WRIST FRACTURE Left 01/17/2017   Procedure: OPEN REDUCTION INTERNAL FIXATION (ORIF) WRIST  FRACTURE;  Surgeon: Lovell Sheehan, MD;  Location: [facility];  Service: Orthopedics;  Laterality: Left;  . RADIOLOGY WITH ANESTHESIA N/A 03/18/2020   Procedure: MRI WITH ANESTHESIA CERVICAL SPINE AND BRAIN  WITH AND WITHOUT CONTRAST;  Surgeon: Radiologist, Medication, MD;  Location: [facility];  Service: Radiology;  Laterality: N/A;  . TUBAL LIGATION Bilateral   . VAGINAL HYSTERECTOMY  03/2006    There were no vitals filed for this visit.   Subjective Assessment - 04/15/20 1213    Subjective Patient reports feeling good, is really liking her new medication, feeling more energized.    Patient is accompained by: Family member    Pertinent History Patient is previous patient of this therapist, was admitted to the hospital on 08/03/19 for MS exacerbation and then went to inpatient rehab on 08/08/19. Patient then received home health therapy and now is returning to outpatient PT. PMh includes HTN, SVT, neurogenic bowel, MS (1995), neuropathy, hx of wrist fx, HPV, Hypercholesteremia, adiposity, osteopenia, spasticity. She wears bilateral AFOs and uses a RW and/or wheelchair for mobility. She drives an adapted car.    Limitations Lifting;Standing;Walking;House hold activities    How long can you sit comfortably? n/a    How long can you stand comfortably? 4 minutes    How long can you walk comfortably? walked 50 ft in inpatient rehab  Currently in Pain? No/denies               Gait: Patient ambulates 146f, seated rest break then additional 60 ft with RW. CGA and wheelchair follow for safety. Patient able to navigate opening doors, turns, and thresholds independently but with inc time to perform. Patient demonstrates inc reliance on BUE by end of gait trial.   Seated:  Cross body rotation 2x 60 seconds for core activation and stabilization in midline.  Hand clap with PT for core activation, sequencing, dual task, and UE strengthening x 45 seconds x 3 trials Posterior trunk lean 4x 20 second holds   Pectoral stretch combined with arnold's 15x Arrow pectoral tensioner 10x Row 15x       Ice pack donned throughout session to minimize exacerbation of MS symptoms in order to perform interventions.    Pt educated throughout session about proper posture and technique with exercises. Improved exercise technique, movement at target joints, use of target muscles after min to mod verbal, visual, tactile cues.                        PT Education - 04/15/20 1215    Education provided Yes    Education Details exercise technique    Person(s) Educated Patient    Methods Explanation;Demonstration;Tactile cues;Verbal cues    Comprehension Verbalized understanding;Returned demonstration;Verbal cues required;Tactile cues required            PT Short Term Goals - 02/10/20 1110      PT SHORT TERM GOAL #1   Title Patient will be independent in home exercise program to improve strength/mobility for better functional independence with ADLs.    Baseline 5/19: HEP next session 6/22: HEP compliant 8/10 : HEP progressions compliant 8/31 HEP compliant 10/5: HEP compliant    Time 4    Period Weeks    Status Achieved    Target Date 01/13/20      PT SHORT TERM GOAL #2   Title Patient will perform a sit to stand with SUE support for increased ind. with transfers.    Baseline 5/19: heavy BUE support 6/22: requires one hand on walker one hand on w/c 8/10: able to perform intermittently (some days can some days cannot) 8/31: attempt next session 10/5: BUE reliance, patient unable to complete 4 full stands. Attempt next session    Time 4    Period Weeks    Status Partially Met    Target Date 01/13/20             PT Long Term Goals - 03/09/20 0001      PT LONG TERM GOAL #1   Title Patient will increase FOTO score to equal to or greater than  53/100   to demonstrate statistically significant improvement in mobility and quality of life.     Baseline 8/31: 48.13% 7/27:  48.1% 6/22:  46.99% 5/18: 48/100 10/5: 49/100 11/2: 48%     Time 12    Period Weeks    Status Partially Met    Target Date 06/01/20      PT LONG TERM GOAL #2   Title Patient (< 617years old) will complete five times sit to stand test in < 20 seconds indicating an increased LE strength and improved balance.    Baseline 7/27: 26.8 seconds with one partial stand 6/22: 40. 86 sec; unable to reach full stand last two attempts 5/19: 42 seconds 10/5: BUE reliance, patient unable to complete  4 full stands. Attempt next session 11/2: 23.71 seconds    Time 12    Status Partially Met    Target Date 06/01/20      PT LONG TERM GOAL #3   Title Patient will increase 10 meter walk test to <20 seconds as to improve gait speed for better community ambulation and to reduce fall risk.    Baseline  8/31: 35.1 seconds with RW 7/27: 36 seconds with RW 6/22: 36.1 seconds 5/19: 42 seconds 10/5: 31.9 seconds with RW 11/2: 29.9 seconds with RW     Time 12    Period Weeks    Status Partially Met    Target Date 06/01/20      PT LONG TERM GOAL #4   Title Patient will reduce timed up and go to <11 seconds to reduce fall risk and demonstrate improved transfer/gait ability.    Baseline  8/31: 50.8 seconds with RW 7/27: 43.65 6/22: 45.4 seconds 5/19: 47 seconds 10/5: 38.5 seconds with RW 11/2: 35.72 seconds with RW     Time 12    Period Weeks    Status Partially Met    Target Date 06/01/20      PT LONG TERM GOAL #5   Title Patient will ambulate from physical therapy gym to elevator down the hall, ride elevator to first floor, and walk to valet with RW and chair follow for increased capacity for mobility and ind.    Baseline 8/31: able to open door ~50 ft with one rest break 8/10: unable to ambulate to elevator without rest break 9/30: able to ambulate to elevator with 3 rest breaks    Time 12    Period Weeks    Status Partially Met    Target Date 06/01/20      PT LONG TERM GOAL #6   Title Patient will be able to stand with  RW and lift one leg onto 4" step for carryover to negotiation of steps at home.     Baseline 11/2: unable to perform     Time 12    Period Weeks    Status New    Target Date 06/01/20                 Plan - 04/15/20 1217    Clinical Impression Statement Patient demonstrates excellent motivation throughout session. She is able to perform 170 ft of ambulation with only one seated rest break indicating progression of capacity for functional mobility. Patient is fatigued by end of session. The patient would benefit from further skilled PT intervention to maximize QOL, safety, and functional mobility    Personal Factors and Comorbidities Age;Comorbidity 3+;Fitness;Past/Current Experience;Sex;Time since onset of injury/illness/exacerbation    Comorbidities HTN, SVT, neurogenic bowel, MS (1995), neuropathy, hx of wrist fx, HPV, Hypercholesteremia, adiposity, osteopenia, spasticity.    Examination-Activity Limitations Bathing;Bed Mobility;Bend;Carry;Continence;Dressing;Hygiene/Grooming;Stairs;Squat;Reach Overhead;Locomotion Level;Lift;Stand;Transfers    Examination-Participation Restrictions Church;Cleaning;Community Activity;Laundry;Volunteer;Shop;Meal Prep;Yard Work    [employer] Evolving/Moderate complexity    Rehab Potential Good    PT Frequency 2x / week    PT Duration 12 weeks    PT Treatment/Interventions ADLs/Self Care Home Management;Aquatic Therapy;Biofeedback;Cryotherapy;Electrical Stimulation;Iontophoresis 61m/ml Dexamethasone;Traction;Moist Heat;Ultrasound;DME Instruction;Gait training;Stair training;Functional mobility training;Therapeutic activities;Therapeutic exercise;Balance training;Neuromuscular re-education;Patient/family education;Orthotic Fit/Training;Wheelchair mobility training;Manual techniques;Compression bandaging;Passive range of motion;Dry needling;Energy conservation;Taping;Splinting;Vestibular    PT Next Visit Plan step negotiation, turning  directions to the L and R    PT Home Exercise Plan maintain current HEP from HHPT    Consulted and Agree with Plan of  Care Patient           Patient will benefit from skilled therapeutic intervention in order to improve the following deficits and impairments:  Abnormal gait,Decreased activity tolerance,Decreased balance,Decreased coordination,Decreased endurance,Decreased mobility,Decreased range of motion,Decreased strength,Difficulty walking,Impaired perceived functional ability,Impaired sensation,Impaired tone,Impaired UE functional use,Improper body mechanics,Postural dysfunction,Cardiopulmonary status limiting activity,Impaired flexibility,Increased muscle spasms  Visit Diagnosis: Multiple sclerosis exacerbation ([facility])  Muscle weakness (generalized)  Other abnormalities of gait and mobility  Unsteadiness on feet     Problem List Patient Active Problem List   Diagnosis Date Noted  . Hypoalbuminemia due to protein-calorie malnutrition ([place])   . Neurogenic bowel   . Neurogenic bladder   . Labile blood pressure   . Neuropathic pain   . Abscess of female pelvis   . SVT (supraventricular tachycardia) ([place])   . Radial styloid tenosynovitis 03/12/2018  . Wheelchair confinement 02/27/2018  . Localized osteoporosis with current pathological fracture with routine healing 01/19/2017  . Wrist fracture 01/16/2017  . Sprain of ankle 03/23/2016  . Closed fracture of lateral malleolus 03/16/2016  . Health care maintenance 01/24/2016  . Blood pressure elevated without history of HTN 10/25/2015  . Essential hypertension 10/25/2015  . Multiple sclerosis ([place]) 10/02/2015  . Chronic left shoulder pain 07/19/2015  . Multiple sclerosis exacerbation ([place]) 07/14/2015  . MS (multiple sclerosis) ([place]) 11/26/2014  . Increased body mass index 11/26/2014  . HPV test positive 11/26/2014  . Status post laparoscopic supracervical hysterectomy 11/26/2014  . Galactorrhea 11/26/2014  . Back ache  05/21/2014  . Adiposity 05/21/2014  . Disordered sleep 05/21/2014  . Muscle spasticity 05/21/2014  . Spasticity 05/21/2014  . Calculus of kidney 12/09/2013  . Renal colic 07/06/3141  . Hypercholesteremia 08/19/2013  . Hereditary and idiopathic neuropathy 08/19/2013  . Hypercholesterolemia without hypertriglyceridemia 08/19/2013  . Bladder infection, chronic 07/25/2012  . Disorder of bladder function 07/25/2012  . Incomplete bladder emptying 07/25/2012  . Microscopic hematuria 07/25/2012  . Right upper quadrant pain 07/25/2012   Janna Arch, PT, DPT   04/15/2020, 12:19 PM  [place] MAIN [facility] SERVICES [address] [place], [place], [address] Phone: [phone]   Fax:  [phone]  Name: Omar Orrego Flater MRN: [id number] Date of Birth: 1960/08/27

## 2020-04-20 ENCOUNTER — Ambulatory Visit: Payer: PPO

## 2020-04-22 ENCOUNTER — Ambulatory Visit: Payer: PPO

## 2020-04-22 ENCOUNTER — Other Ambulatory Visit: Payer: Self-pay

## 2020-04-22 DIAGNOSIS — R2689 Other abnormalities of gait and mobility: Secondary | ICD-10-CM

## 2020-04-22 DIAGNOSIS — M6281 Muscle weakness (generalized): Secondary | ICD-10-CM

## 2020-04-22 DIAGNOSIS — G35 Multiple sclerosis: Secondary | ICD-10-CM | POA: Diagnosis not present

## 2020-04-22 DIAGNOSIS — R2681 Unsteadiness on feet: Secondary | ICD-10-CM

## 2020-04-22 NOTE — Therapy (Signed)
[facility] [address] [place], [place], [address] Phone: [phone]   Fax:  [phone]  Physical Therapy Treatment  Patient Details  Name: Lisa Williams MRN: [id number] Date of Birth: 01/29/1961 Referring Provider (PT): Zachary Potter, MD   Encounter Date: 04/22/2020   PT End of Session - 04/22/20 1146    Visit Number [id number]    Number of Visits 72    Date for PT Re-Evaluation 06/01/20    Authorization Type 7/10 PN 03/23/20    PT Start Time 1015    PT Stop Time 1059    PT Time Calculation (min) 44 min    Equipment Utilized During Treatment Gait belt;Other (comment)   ice pack   Activity Tolerance Patient tolerated treatment well    Behavior During Therapy WFL for tasks assessed/performed           Past Medical History:  Diagnosis Date  . Abdominal pain, right upper quadrant   . Back pain   . Calculus of kidney 12/09/2013  . Chronic back pain    unspecified  . Chronic left shoulder pain 07/19/2015  . Complication of anesthesia   . Functional disorder of bladder    other  . Galactorrhea 11/26/2014   Chronic   . Hereditary and idiopathic neuropathy 08/19/2013  . HPV test positive   . Hypercholesteremia 08/19/2013  . Hypertension   . Incomplete bladder emptying   . Microscopic hematuria   . MS (multiple sclerosis) (HCC)   . Muscle spasticity 05/21/2014  . Nonspecific findings on examination of urine    other  . Osteopenia   . PONV (postoperative nausea and vomiting)   . Status post laparoscopic supracervical hysterectomy 11/26/2014  . Tobacco user 11/26/2014  . Wrist fracture     Past Surgical History:  Procedure Laterality Date  . bilateral tubal ligation  1996  . BREAST CYST EXCISION Left 2002  . FRACTURE SURGERY    . KNEE SURGERY     right  . LAPAROSCOPIC SUPRACERVICAL HYSTERECTOMY  08/05/2013  . ORIF WRIST FRACTURE Left 01/17/2017   Procedure: OPEN REDUCTION INTERNAL FIXATION (ORIF) WRIST  FRACTURE;  Surgeon: Bowers, James R, MD;  Location: [facility] ORS;  Service: Orthopedics;  Laterality: Left;  . RADIOLOGY WITH ANESTHESIA N/A 03/18/2020   Procedure: MRI WITH ANESTHESIA CERVICAL SPINE AND BRAIN  WITH AND WITHOUT CONTRAST;  Surgeon: Radiologist, Medication, MD;  Location: [facility] OR;  Service: Radiology;  Laterality: N/A;  . TUBAL LIGATION Bilateral   . VAGINAL HYSTERECTOMY  03/2006    There were no vitals filed for this visit.   Subjective Assessment - 04/22/20 1133    Subjective Patient reports having a busy morning. Has been compliant with HEP. No falls or LOB.    Patient is accompained by: Family member    Pertinent History Patient is previous patient of this therapist, was admitted to the hospital on 08/03/19 for MS exacerbation and then went to inpatient rehab on 08/08/19. Patient then received home health therapy and now is returning to outpatient PT. PMh includes HTN, SVT, neurogenic bowel, MS (1995), neuropathy, hx of wrist fx, HPV, Hypercholesteremia, adiposity, osteopenia, spasticity. She wears bilateral AFOs and uses a RW and/or wheelchair for mobility. She drives an adapted car.    Limitations Lifting;Standing;Walking;House hold activities    How long can you sit comfortably? n/a    How long can you stand comfortably? 4 minutes    How long can you walk comfortably? walked 50 ft in   inpatient rehab    Currently in Pain? No/denies               Standing in // bars:  -Standing weight shifts 10x each side; UE support focus on hip shift rather than pull with arm -Knee bends/ mini squat5x with UE support and w/c behind, 2 sets -6" step  Bend over and reach, pick up cone and pass midline x2  -step over green theraband 5x each LE, cues for shift of LE's.   Seated:  Cross body rotation 2x 60 seconds for core activation and stabilization in midline.  Swiss ball forward and lateral rollout 8x each direction.  Hand clap with PT for core activation, sequencing, dual task, and UE  strengthening x 45 seconds x 3 trials Posterior trunk lean 4x 20 second holds  Pectoral stretch combined with arnold's 15x    Ice pack donned throughout session to minimize exacerbation of MS symptoms in order to perform interventions.    Pt educated throughout session about proper posture and technique with exercises. Improved exercise technique, movement at target joints, use of target muscles after min to mod verbal, visual, tactile cues.                       PT Education - 04/22/20 1146    Education provided Yes    Education Details exercise technique, body mechanics    Person(s) Educated Patient    Methods Explanation;Demonstration;Tactile cues;Verbal cues    Comprehension Verbalized understanding;Returned demonstration;Verbal cues required;Tactile cues required            PT Short Term Goals - 02/10/20 1110      PT SHORT TERM GOAL #1   Title Patient will be independent in home exercise program to improve strength/mobility for better functional independence with ADLs.    Baseline 5/19: HEP next session 6/22: HEP compliant 8/10 : HEP progressions compliant 8/31 HEP compliant 10/5: HEP compliant    Time 4    Period Weeks    Status Achieved    Target Date 01/13/20      PT SHORT TERM GOAL #2   Title Patient will perform a sit to stand with SUE support for increased ind. with transfers.    Baseline 5/19: heavy BUE support 6/22: requires one hand on walker one hand on w/c 8/10: able to perform intermittently (some days can some days cannot) 8/31: attempt next session 10/5: BUE reliance, patient unable to complete 4 full stands. Attempt next session    Time 4    Period Weeks    Status Partially Met    Target Date 01/13/20             PT Long Term Goals - 03/09/20 0001      PT LONG TERM GOAL #1   Title Patient will increase FOTO score to equal to or greater than  53/100   to demonstrate statistically significant improvement in mobility and quality of  life.     Baseline 8/31: 48.13% 7/27:  48.1% 6/22: 46.99% 5/18: 48/100 10/5: 49/100 11/2: 48%     Time 12    Period Weeks    Status Partially Met    Target Date 06/01/20      PT LONG TERM GOAL #2   Title Patient (< 67 years old) will complete five times sit to stand test in < 20 seconds indicating an increased LE strength and improved balance.    Baseline 7/27: 26.8 seconds with one partial stand  6/22: 40. 86 sec; unable to reach full stand last two attempts 5/19: 42 seconds 10/5: BUE reliance, patient unable to complete 4 full stands. Attempt next session 11/2: 23.71 seconds    Time 12    Status Partially Met    Target Date 06/01/20      PT LONG TERM GOAL #3   Title Patient will increase 10 meter walk test to <20 seconds as to improve gait speed for better community ambulation and to reduce fall risk.    Baseline  8/31: 35.1 seconds with RW 7/27: 36 seconds with RW 6/22: 36.1 seconds 5/19: 42 seconds 10/5: 31.9 seconds with RW 11/2: 29.9 seconds with RW     Time 12    Period Weeks    Status Partially Met    Target Date 06/01/20      PT LONG TERM GOAL #4   Title Patient will reduce timed up and go to <11 seconds to reduce fall risk and demonstrate improved transfer/gait ability.    Baseline  8/31: 50.8 seconds with RW 7/27: 43.65 6/22: 45.4 seconds 5/19: 47 seconds 10/5: 38.5 seconds with RW 11/2: 35.72 seconds with RW     Time 12    Period Weeks    Status Partially Met    Target Date 06/01/20      PT LONG TERM GOAL #5   Title Patient will ambulate from physical therapy gym to elevator down the hall, ride elevator to first floor, and walk to valet with RW and chair follow for increased capacity for mobility and ind.    Baseline 8/31: able to open door ~50 ft with one rest break 8/10: unable to ambulate to elevator without rest break 9/30: able to ambulate to elevator with 3 rest breaks    Time 12    Period Weeks    Status Partially Met    Target Date 06/01/20      PT LONG TERM  GOAL #6   Title Patient will be able to stand with RW and lift one leg onto 4" step for carryover to negotiation of steps at home.     Baseline 11/2: unable to perform     Time 12    Period Weeks    Status New    Target Date 06/01/20                 Plan - 04/22/20 1217    Clinical Impression Statement Patient is improving with her ability to reach and grab objects and return to upright posture in standing position. She is able to tolerate majority of her session in standing this session. Patient is fatigued by end of session. The patient would benefit from further skilled PT intervention to maximize QOL, safety, and functional mobility    Personal Factors and Comorbidities Age;Comorbidity 3+;Fitness;Past/Current Experience;Sex;Time since onset of injury/illness/exacerbation    Comorbidities HTN, SVT, neurogenic bowel, MS (1995), neuropathy, hx of wrist fx, HPV, Hypercholesteremia, adiposity, osteopenia, spasticity.    Examination-Activity Limitations Bathing;Bed Mobility;Bend;Carry;Continence;Dressing;Hygiene/Grooming;Stairs;Squat;Reach Overhead;Locomotion Level;Lift;Stand;Transfers    Examination-Participation Restrictions Church;Cleaning;Community Activity;Laundry;Volunteer;Shop;Meal Prep;Yard Work    Stability/Clinical Decision Making Evolving/Moderate complexity    Rehab Potential Good    PT Frequency 2x / week    PT Duration 12 weeks    PT Treatment/Interventions ADLs/Self Care Home Management;Aquatic Therapy;Biofeedback;Cryotherapy;Electrical Stimulation;Iontophoresis 4mg/ml Dexamethasone;Traction;Moist Heat;Ultrasound;DME Instruction;Gait training;Stair training;Functional mobility training;Therapeutic activities;Therapeutic exercise;Balance training;Neuromuscular re-education;Patient/family education;Orthotic Fit/Training;Wheelchair mobility training;Manual techniques;Compression bandaging;Passive range of motion;Dry needling;Energy conservation;Taping;Splinting;Vestibular    PT  Next Visit Plan step negotiation, turning directions   to the L and R    PT Home Exercise Plan maintain current HEP from [facility]    Consulted and Agree with Plan of Care Patient           Patient will benefit from skilled therapeutic intervention in order to improve the following deficits and impairments:  Abnormal gait,Decreased activity tolerance,Decreased balance,Decreased coordination,Decreased endurance,Decreased mobility,Decreased range of motion,Decreased strength,Difficulty walking,Impaired perceived functional ability,Impaired sensation,Impaired tone,Impaired UE functional use,Improper body mechanics,Postural dysfunction,Cardiopulmonary status limiting activity,Impaired flexibility,Increased muscle spasms  Visit Diagnosis: Multiple sclerosis exacerbation (HCC)  Muscle weakness (generalized)  Other abnormalities of gait and mobility  Unsteadiness on feet     Problem List Patient Active Problem List   Diagnosis Date Noted  . Hypoalbuminemia due to protein-calorie malnutrition (HCC)   . Neurogenic bowel   . Neurogenic bladder   . Labile blood pressure   . Neuropathic pain   . Abscess of female pelvis   . SVT (supraventricular tachycardia) (HCC)   . Radial styloid tenosynovitis 03/12/2018  . Wheelchair confinement 02/27/2018  . Localized osteoporosis with current pathological fracture with routine healing 01/19/2017  . Wrist fracture 01/16/2017  . Sprain of ankle 03/23/2016  . Closed fracture of lateral malleolus 03/16/2016  . Health care maintenance 01/24/2016  . Blood pressure elevated without history of HTN 10/25/2015  . Essential hypertension 10/25/2015  . Multiple sclerosis (HCC) 10/02/2015  . Chronic left shoulder pain 07/19/2015  . Multiple sclerosis exacerbation (HCC) 07/14/2015  . MS (multiple sclerosis) (HCC) 11/26/2014  . Increased body mass index 11/26/2014  . HPV test positive 11/26/2014  . Status post laparoscopic supracervical hysterectomy 11/26/2014  .  Galactorrhea 11/26/2014  . Back ache 05/21/2014  . Adiposity 05/21/2014  . Disordered sleep 05/21/2014  . Muscle spasticity 05/21/2014  . Spasticity 05/21/2014  . Calculus of kidney 12/09/2013  . Renal colic 12/09/2013  . Hypercholesteremia 08/19/2013  . Hereditary and idiopathic neuropathy 08/19/2013  . Hypercholesterolemia without hypertriglyceridemia 08/19/2013  . Bladder infection, chronic 07/25/2012  . Disorder of bladder function 07/25/2012  . Incomplete bladder emptying 07/25/2012  . Microscopic hematuria 07/25/2012  . Right upper quadrant pain 07/25/2012    , PT, DPT   04/22/2020, 12:18 PM  [facility] [address] [place], [place], [address] Phone: [phone]   Fax:  [phone]  Name: Lisa Williams MRN: [id number] Date of Birth: 08/10/1960   

## 2020-04-27 ENCOUNTER — Ambulatory Visit: Payer: PPO

## 2020-04-27 ENCOUNTER — Other Ambulatory Visit: Payer: Self-pay

## 2020-04-27 DIAGNOSIS — R2681 Unsteadiness on feet: Secondary | ICD-10-CM

## 2020-04-27 DIAGNOSIS — R2689 Other abnormalities of gait and mobility: Secondary | ICD-10-CM

## 2020-04-27 DIAGNOSIS — G35 Multiple sclerosis: Secondary | ICD-10-CM | POA: Diagnosis not present

## 2020-04-27 DIAGNOSIS — M6281 Muscle weakness (generalized): Secondary | ICD-10-CM

## 2020-04-27 NOTE — Therapy (Signed)
[place] MAIN [facility] SERVICES [address] [place], [place], [address] Phone: [phone]   Fax:  [phone]  Physical Therapy Treatment  Patient Details  Name: Lisa Williams MRN: [id number] Date of Birth: November 06, 1960 Referring Provider (PT): Gurney Maxin, MD   Encounter Date: 04/27/2020   PT End of Session - 04/27/20 2993    Visit Number 69    Number of Visits 72    Date for PT Re-Evaluation 06/01/20    Authorization Type 8/10 PN 03/23/20    PT Start Time 1015    PT Stop Time 1101    PT Time Calculation (min) 46 min    Equipment Utilized During Treatment Gait belt;Other (comment)   ice pack   Activity Tolerance Patient tolerated treatment well    Behavior During Therapy WFL for tasks assessed/performed           Past Medical History:  Diagnosis Date  . Abdominal pain, right upper quadrant   . Back pain   . Calculus of kidney 12/09/2013  . Chronic back pain    unspecified  . Chronic left shoulder pain 07/19/2015  . Complication of anesthesia   . Functional disorder of bladder    other  . Galactorrhea 11/26/2014   Chronic   . Hereditary and idiopathic neuropathy 08/19/2013  . HPV test positive   . Hypercholesteremia 08/19/2013  . Hypertension   . Incomplete bladder emptying   . Microscopic hematuria   . MS (multiple sclerosis) ([place])   . Muscle spasticity 05/21/2014  . Nonspecific findings on examination of urine    other  . Osteopenia   . PONV (postoperative nausea and vomiting)   . Status post laparoscopic supracervical hysterectomy 11/26/2014  . Tobacco user 11/26/2014  . Wrist fracture     Past Surgical History:  Procedure Laterality Date  . bilateral tubal ligation  1996  . BREAST CYST EXCISION Left 2002  . FRACTURE SURGERY    . KNEE SURGERY     right  . LAPAROSCOPIC SUPRACERVICAL HYSTERECTOMY  08/05/2013  . ORIF WRIST FRACTURE Left 01/17/2017   Procedure: OPEN REDUCTION INTERNAL FIXATION (ORIF) WRIST  FRACTURE;  Surgeon: Lovell Sheehan, MD;  Location: [facility];  Service: Orthopedics;  Laterality: Left;  . RADIOLOGY WITH ANESTHESIA N/A 03/18/2020   Procedure: MRI WITH ANESTHESIA CERVICAL SPINE AND BRAIN  WITH AND WITHOUT CONTRAST;  Surgeon: Radiologist, Medication, MD;  Location: [place];  Service: Radiology;  Laterality: N/A;  . TUBAL LIGATION Bilateral   . VAGINAL HYSTERECTOMY  03/2006    There were no vitals filed for this visit.   Subjective Assessment - 04/27/20 1136    Subjective Patient reports compliance with HEP. Is excited to walk today and atttempt to beat her previous distance from the week prior.    Patient is accompained by: Family member    Pertinent History Patient is previous patient of this therapist, was admitted to the hospital on 08/03/19 for MS exacerbation and then went to inpatient rehab on 08/08/19. Patient then received home health therapy and now is returning to outpatient PT. PMh includes HTN, SVT, neurogenic bowel, MS (1995), neuropathy, hx of wrist fx, HPV, Hypercholesteremia, adiposity, osteopenia, spasticity. She wears bilateral AFOs and uses a RW and/or wheelchair for mobility. She drives an adapted car.    Limitations Lifting;Standing;Walking;House hold activities    How long can you sit comfortably? n/a    How long can you stand comfortably? 4 minutes    How long can you  walk comfortably? walked 50 ft in inpatient rehab    Currently in Pain? No/denies                Gait: Patient ambulates132f, seated rest break then additional 7[place] CGA and wheelchair follow for safety. Patient able to navigate opening doors, turns, and thresholds independently but with inc time to perform.Patient demonstrates inc reliance on BUE by end of gait trial.     Seated:  Cross body rotation 2x 60 seconds for core activation and stabilization in midline.  Forward/backward trunk lean for modified seated sit up x 12; very challenging  Hand clap with PT for core  activation, sequencing, dual task, and UE strengthening x 45 seconds x 3 trials  isometric contraction against PT resistance: -hip flexion 10x 3 second hold -hip abduction 10x 3 second hold -hip adduction 10x 3 second holds     Ice pack donned throughout session to minimize exacerbation of MS symptoms in order to perform interventions.    Pt educated throughout session about proper posture and technique with exercises. Improved exercise technique, movement at target joints, use of target muscles after min to mod verbal, visual, tactile cues.                  PT Education - 04/27/20 1138    Education provided Yes    Education Details exercise technique, body mechanics    Person(s) Educated Patient    Methods Explanation;Demonstration;Tactile cues;Verbal cues    Comprehension Verbalized understanding;Verbal cues required;Tactile cues required;Returned demonstration            PT Short Term Goals - 02/10/20 1110      PT SHORT TERM GOAL #1   Title Patient will be independent in home exercise program to improve strength/mobility for better functional independence with ADLs.    Baseline 5/19: HEP next session 6/22: HEP compliant 8/10 : HEP progressions compliant 8/31 HEP compliant 10/5: HEP compliant    Time 4    Period Weeks    Status Achieved    Target Date 01/13/20      PT SHORT TERM GOAL #2   Title Patient will perform a sit to stand with SUE support for increased ind. with transfers.    Baseline 5/19: heavy BUE support 6/22: requires one hand on walker one hand on w/c 8/10: able to perform intermittently (some days can some days cannot) 8/31: attempt next session 10/5: BUE reliance, patient unable to complete 4 full stands. Attempt next session    Time 4    Period Weeks    Status Partially Met    Target Date 01/13/20             PT Long Term Goals - 03/09/20 0001      PT LONG TERM GOAL #1   Title Patient will increase FOTO score to equal to or greater  than  53/100   to demonstrate statistically significant improvement in mobility and quality of life.     Baseline 8/31: 48.13% 7/27:  48.1% 6/22: 46.99% 5/18: 48/100 10/5: 49/100 11/2: 48%     Time 12    Period Weeks    Status Partially Met    Target Date 06/01/20      PT LONG TERM GOAL #2   Title Patient (< 622years old) will complete five times sit to stand test in < 20 seconds indicating an increased LE strength and improved balance.    Baseline 7/27: 26.8 seconds with one partial stand  6/22: 40. 86 sec; unable to reach full stand last two attempts 5/19: 42 seconds 10/5: BUE reliance, patient unable to complete 4 full stands. Attempt next session 11/2: 23.71 seconds    Time 12    Status Partially Met    Target Date 06/01/20      PT LONG TERM GOAL #3   Title Patient will increase 10 meter walk test to <20 seconds as to improve gait speed for better community ambulation and to reduce fall risk.    Baseline  8/31: 35.1 seconds with RW 7/27: 36 seconds with RW 6/22: 36.1 seconds 5/19: 42 seconds 10/5: 31.9 seconds with RW 11/2: 29.9 seconds with RW     Time 12    Period Weeks    Status Partially Met    Target Date 06/01/20      PT LONG TERM GOAL #4   Title Patient will reduce timed up and go to <11 seconds to reduce fall risk and demonstrate improved transfer/gait ability.    Baseline  8/31: 50.8 seconds with RW 7/27: 43.65 6/22: 45.4 seconds 5/19: 47 seconds 10/5: 38.5 seconds with RW 11/2: 35.72 seconds with RW     Time 12    Period Weeks    Status Partially Met    Target Date 06/01/20      PT LONG TERM GOAL #5   Title Patient will ambulate from physical therapy gym to elevator down the hall, ride elevator to first floor, and walk to valet with RW and chair follow for increased capacity for mobility and ind.    Baseline 8/31: able to open door ~50 ft with one rest break 8/10: unable to ambulate to elevator without rest break 9/30: able to ambulate to elevator with 3 rest breaks     Time 12    Period Weeks    Status Partially Met    Target Date 06/01/20      PT LONG TERM GOAL #6   Title Patient will be able to stand with RW and lift one leg onto 4" step for carryover to negotiation of steps at home.     Baseline 11/2: unable to perform     Time 12    Period Weeks    Status New    Target Date 06/01/20                 Plan - 04/27/20 1309    Clinical Impression Statement Patient demonstrates excellent motivation throughout physical therapy session. Ambulating a further duration than previous with only one seated rest break indicating and improved capacity for functional mobility The patient would benefit from further skilled PT intervention to maximize QOL, safety, and functional mobility    Personal Factors and Comorbidities Age;Comorbidity 3+;Fitness;Past/Current Experience;Sex;Time since onset of injury/illness/exacerbation    Comorbidities HTN, SVT, neurogenic bowel, MS (1995), neuropathy, hx of wrist fx, HPV, Hypercholesteremia, adiposity, osteopenia, spasticity.    Examination-Activity Limitations Bathing;Bed Mobility;Bend;Carry;Continence;Dressing;Hygiene/Grooming;Stairs;Squat;Reach Overhead;Locomotion Level;Lift;Stand;Transfers    Examination-Participation Restrictions Church;Cleaning;Community Activity;Laundry;Volunteer;Shop;Meal Prep;Yard Work    [employer] Evolving/Moderate complexity    Rehab Potential Good    PT Frequency 2x / week    PT Duration 12 weeks    PT Treatment/Interventions ADLs/Self Care Home Management;Aquatic Therapy;Biofeedback;Cryotherapy;Electrical Stimulation;Iontophoresis 90m/ml Dexamethasone;Traction;Moist Heat;Ultrasound;DME Instruction;Gait training;Stair training;Functional mobility training;Therapeutic activities;Therapeutic exercise;Balance training;Neuromuscular re-education;Patient/family education;Orthotic Fit/Training;Wheelchair mobility training;Manual techniques;Compression bandaging;Passive range  of motion;Dry needling;Energy conservation;Taping;Splinting;Vestibular    PT Next Visit Plan step negotiation, turning directions to the L and R    PT Home Exercise Plan  maintain current HEP from [place] and Agree with Plan of Care Patient           Patient will benefit from skilled therapeutic intervention in order to improve the following deficits and impairments:  Abnormal gait,Decreased activity tolerance,Decreased balance,Decreased coordination,Decreased endurance,Decreased mobility,Decreased range of motion,Decreased strength,Difficulty walking,Impaired perceived functional ability,Impaired sensation,Impaired tone,Impaired UE functional use,Improper body mechanics,Postural dysfunction,Cardiopulmonary status limiting activity,Impaired flexibility,Increased muscle spasms  Visit Diagnosis: Multiple sclerosis exacerbation (H[facility])  Muscle weakness (generalized)  Other abnormalities of gait and mobility  Unsteadiness on feet     Problem List Patient Active Problem List   Diagnosis Date Noted  . Hypoalbuminemia due to protein-calorie malnutrition ([place])   . Neurogenic bowel   . Neurogenic bladder   . Labile blood pressure   . Neuropathic pain   . Abscess of female pelvis   . SVT (supraventricular tachycardia) ([place])   . Radial styloid tenosynovitis 03/12/2018  . Wheelchair confinement 02/27/2018  . Localized osteoporosis with current pathological fracture with routine healing 01/19/2017  . Wrist fracture 01/16/2017  . Sprain of ankle 03/23/2016  . Closed fracture of lateral malleolus 03/16/2016  . Health care maintenance 01/24/2016  . Blood pressure elevated without history of HTN 10/25/2015  . Essential hypertension 10/25/2015  . Multiple sclerosis ([place]) 10/02/2015  . Chronic left shoulder pain 07/19/2015  . Multiple sclerosis exacerbation ([place]) 07/14/2015  . MS (multiple sclerosis) ([place]) 11/26/2014  . Increased body mass index 11/26/2014  . HPV test positive  11/26/2014  . Status post laparoscopic supracervical hysterectomy 11/26/2014  . Galactorrhea 11/26/2014  . Back ache 05/21/2014  . Adiposity 05/21/2014  . Disordered sleep 05/21/2014  . Muscle spasticity 05/21/2014  . Spasticity 05/21/2014  . Calculus of kidney 12/09/2013  . Renal colic 96/28/3662  . Hypercholesteremia 08/19/2013  . Hereditary and idiopathic neuropathy 08/19/2013  . Hypercholesterolemia without hypertriglyceridemia 08/19/2013  . Bladder infection, chronic 07/25/2012  . Disorder of bladder function 07/25/2012  . Incomplete bladder emptying 07/25/2012  . Microscopic hematuria 07/25/2012  . Right upper quadrant pain 07/25/2012   Janna Arch, PT, DPT   04/27/2020, 1:14 PM  [place] MAIN [facility] SERVICES [address] [place], [place], [address] Phone: [phone]   Fax:  [phone]  Name: Lisa Williams MRN: [id number] Date of Birth: 06-06-1960

## 2020-04-29 ENCOUNTER — Other Ambulatory Visit: Payer: Self-pay

## 2020-04-29 ENCOUNTER — Ambulatory Visit: Payer: PPO

## 2020-04-29 DIAGNOSIS — R2689 Other abnormalities of gait and mobility: Secondary | ICD-10-CM

## 2020-04-29 DIAGNOSIS — G35 Multiple sclerosis: Secondary | ICD-10-CM | POA: Diagnosis not present

## 2020-04-29 DIAGNOSIS — M6281 Muscle weakness (generalized): Secondary | ICD-10-CM

## 2020-04-29 DIAGNOSIS — R2681 Unsteadiness on feet: Secondary | ICD-10-CM

## 2020-04-29 NOTE — Therapy (Signed)
[place] MAIN [facility] SERVICES [address] [place], [place], [address] Phone: [phone]   Fax:  [phone]  Physical Therapy Treatment  Patient Details  Name: Lisa Williams MRN: [id number] Date of Birth: [birth year] Referring Provider (PT): Gurney Maxin, MD   Encounter Date: 04/29/2020   PT End of Session - 04/29/20 1014    Visit Number 61    Number of Visits 72    Date for PT Re-Evaluation 06/01/20    Authorization Type 9/10 PN 03/23/20    PT Start Time 1015    PT Stop Time 1059    PT Time Calculation (min) 44 min    Equipment Utilized During Treatment Gait belt;Other (comment)   ice pack   Activity Tolerance Patient tolerated treatment well    Behavior During Therapy WFL for tasks assessed/performed           Past Medical History:  Diagnosis Date  . Abdominal pain, right upper quadrant   . Back pain   . Calculus of kidney 12/09/2013  . Chronic back pain    unspecified  . Chronic left shoulder pain 07/19/2015  . Complication of anesthesia   . Functional disorder of bladder    other  . Galactorrhea 11/26/2014   Chronic   . Hereditary and idiopathic neuropathy 08/19/2013  . HPV test positive   . Hypercholesteremia 08/19/2013  . Hypertension   . Incomplete bladder emptying   . Microscopic hematuria   . MS (multiple sclerosis) ([place])   . Muscle spasticity 05/21/2014  . Nonspecific findings on examination of urine    other  . Osteopenia   . PONV (postoperative nausea and vomiting)   . Status post laparoscopic supracervical hysterectomy 11/26/2014  . Tobacco user 11/26/2014  . Wrist fracture     Past Surgical History:  Procedure Laterality Date  . bilateral tubal ligation  1996  . BREAST CYST EXCISION Left 2002  . FRACTURE SURGERY    . KNEE SURGERY     right  . LAPAROSCOPIC SUPRACERVICAL HYSTERECTOMY  08/05/2013  . ORIF WRIST FRACTURE Left 01/17/2017   Procedure: OPEN REDUCTION INTERNAL FIXATION (ORIF) WRIST  FRACTURE;  Surgeon: Lovell Sheehan, MD;  Location: [facility];  Service: Orthopedics;  Laterality: Left;  . RADIOLOGY WITH ANESTHESIA N/A 03/18/2020   Procedure: MRI WITH ANESTHESIA CERVICAL SPINE AND BRAIN  WITH AND WITHOUT CONTRAST;  Surgeon: Radiologist, Medication, MD;  Location: [place];  Service: Radiology;  Laterality: N/A;  . TUBAL LIGATION Bilateral   . VAGINAL HYSTERECTOMY  03/2006    There were no vitals filed for this visit.   Subjective Assessment - 04/29/20 1419    Subjective Patient reports having low back pain and spasming of R trunk. She additionally reports some increased weakness of RLE for the past day.    Patient is accompained by: Family member    Pertinent History Patient is previous patient of this therapist, was admitted to the hospital on 08/03/19 for MS exacerbation and then went to inpatient rehab on 08/08/19. Patient then received home health therapy and now is returning to outpatient PT. PMh includes HTN, SVT, neurogenic bowel, MS (1995), neuropathy, hx of wrist fx, HPV, Hypercholesteremia, adiposity, osteopenia, spasticity. She wears bilateral AFOs and uses a RW and/or wheelchair for mobility. She drives an adapted car.    Limitations Lifting;Standing;Walking;House hold activities    How long can you sit comfortably? n/a    How long can you stand comfortably? 4 minutes    How long  can you walk comfortably? walked 50 ft in inpatient rehab    Currently in Pain? Yes    Pain Score 3     Pain Location Back    Pain Orientation Lower    Pain Descriptors / Indicators Aching    Pain Type Chronic pain    Pain Onset Yesterday    Pain Frequency Intermittent             Patient standing interventions terminated due to spasm of back.   Seated:  STM to lumbar spine with focus on L parapsinals and quadratus lumborum x 9 minutes; patient reports relief of spasm by end of session.    Forward trunk lean with PT guidance for lengthening of musculature 4x 30 second  holds Forward/backward trunk lean for modified seated sit up x 12; very challenging  Green swiss ball:  TrA contraction pressing swiss ball between knees and hands 10x 3 second holds TrA contraction pressing swiss ball between knees and hands with UE raise 10x each UE Overhead with trunk extension lean swiss ball raise 10x with cues for arc of motion to PT hand  Posterior trunk lean 4x 20 second holds  Pectoral stretch combined with arnold's 15x LAQ 10x with Min A each LE  isometric contraction against PT resistance: -hip flexion 10x 3 second hold -hip abduction 10x 3 second hold -hip adduction 10x 3 second holds   Ice pack donned throughout session to minimize exacerbation of MS symptoms in order to perform interventions.  Pt educated throughout session about proper posture and technique with exercises. Improved exercise technique, movement at target joints, use of target muscles after min to mod verbal, visual, tactile cues.                       PT Education - 04/29/20 1013    Education provided Yes    Education Details exercise technique, body mechanics    Person(s) Educated Patient    Methods Explanation;Demonstration;Tactile cues;Verbal cues    Comprehension Verbalized understanding;Returned demonstration;Verbal cues required;Tactile cues required            PT Short Term Goals - 02/10/20 1110      PT SHORT TERM GOAL #1   Title Patient will be independent in home exercise program to improve strength/mobility for better functional independence with ADLs.    Baseline 5/19: HEP next session 6/22: HEP compliant 8/10 : HEP progressions compliant 8/31 HEP compliant 10/5: HEP compliant    Time 4    Period Weeks    Status Achieved    Target Date 01/13/20      PT SHORT TERM GOAL #2   Title Patient will perform a sit to stand with SUE support for increased ind. with transfers.    Baseline 5/19: heavy BUE support 6/22: requires one hand on walker one  hand on w/c 8/10: able to perform intermittently (some days can some days cannot) 8/31: attempt next session 10/5: BUE reliance, patient unable to complete 4 full stands. Attempt next session    Time 4    Period Weeks    Status Partially Met    Target Date 01/13/20             PT Long Term Goals - 03/09/20 0001      PT LONG TERM GOAL #1   Title Patient will increase FOTO score to equal to or greater than  53/100   to demonstrate statistically significant improvement in mobility and quality of life.  Baseline 8/31: 48.13% 7/27:  48.1% 6/22: 46.99% 5/18: 48/100 10/5: 49/100 11/2: 48%     Time 12    Period Weeks    Status Partially Met    Target Date 06/01/20      PT LONG TERM GOAL #2   Title Patient (< 30 years old) will complete five times sit to stand test in < 20 seconds indicating an increased LE strength and improved balance.    Baseline 7/27: 26.8 seconds with one partial stand 6/22: 40. 86 sec; unable to reach full stand last two attempts 5/19: 42 seconds 10/5: BUE reliance, patient unable to complete 4 full stands. Attempt next session 11/2: 23.71 seconds    Time 12    Status Partially Met    Target Date 06/01/20      PT LONG TERM GOAL #3   Title Patient will increase 10 meter walk test to <20 seconds as to improve gait speed for better community ambulation and to reduce fall risk.    Baseline  8/31: 35.1 seconds with RW 7/27: 36 seconds with RW 6/22: 36.1 seconds 5/19: 42 seconds 10/5: 31.9 seconds with RW 11/2: 29.9 seconds with RW     Time 12    Period Weeks    Status Partially Met    Target Date 06/01/20      PT LONG TERM GOAL #4   Title Patient will reduce timed up and go to <11 seconds to reduce fall risk and demonstrate improved transfer/gait ability.    Baseline  8/31: 50.8 seconds with RW 7/27: 43.65 6/22: 45.4 seconds 5/19: 47 seconds 10/5: 38.5 seconds with RW 11/2: 35.72 seconds with RW     Time 12    Period Weeks    Status Partially Met    Target Date  06/01/20      PT LONG TERM GOAL #5   Title Patient will ambulate from physical therapy gym to elevator down the hall, ride elevator to first floor, and walk to valet with RW and chair follow for increased capacity for mobility and ind.    Baseline 8/31: able to open door ~50 ft with one rest break 8/10: unable to ambulate to elevator without rest break 9/30: able to ambulate to elevator with 3 rest breaks    Time 12    Period Weeks    Status Partially Met    Target Date 06/01/20      PT LONG TERM GOAL #6   Title Patient will be able to stand with RW and lift one leg onto 4" step for carryover to negotiation of steps at home.     Baseline 11/2: unable to perform     Time 12    Period Weeks    Status New    Target Date 06/01/20                 Plan - 04/29/20 1537    Clinical Impression Statement Patient's session is limited by back pain and spasming of L trunk. She is educated on need to track new onset of weakness of LLE and report to doctor if continues to persist. Patient agreeable. The patient would benefit from further skilled PT intervention to maximize QOL, safety, and functional mobility    Personal Factors and Comorbidities Age;Comorbidity 3+;Fitness;Past/Current Experience;Sex;Time since onset of injury/illness/exacerbation    Comorbidities HTN, SVT, neurogenic bowel, MS (1995), neuropathy, hx of wrist fx, HPV, Hypercholesteremia, adiposity, osteopenia, spasticity.    Examination-Activity Limitations Bathing;Bed Mobility;Bend;Carry;Continence;Dressing;Hygiene/Grooming;Stairs;Squat;Reach Overhead;Locomotion Level;Lift;Stand;Transfers  Examination-Participation Restrictions Church;Cleaning;Community Activity;Laundry;Volunteer;Shop;Meal Prep;Yard Work    [employer] Evolving/Moderate complexity    Rehab Potential Good    PT Frequency 2x / week    PT Duration 12 weeks    PT Treatment/Interventions ADLs/Self Care Home Management;Aquatic  Therapy;Biofeedback;Cryotherapy;Electrical Stimulation;Iontophoresis 29m/ml Dexamethasone;Traction;Moist Heat;Ultrasound;DME Instruction;Gait training;Stair training;Functional mobility training;Therapeutic activities;Therapeutic exercise;Balance training;Neuromuscular re-education;Patient/family education;Orthotic Fit/Training;Wheelchair mobility training;Manual techniques;Compression bandaging;Passive range of motion;Dry needling;Energy conservation;Taping;Splinting;Vestibular    PT Next Visit Plan step negotiation, turning directions to the L and R    PT Home Exercise Plan maintain current HEP from [place] Agree with Plan of Care Patient           Patient will benefit from skilled therapeutic intervention in order to improve the following deficits and impairments:  Abnormal gait,Decreased activity tolerance,Decreased balance,Decreased coordination,Decreased endurance,Decreased mobility,Decreased range of motion,Decreased strength,Difficulty walking,Impaired perceived functional ability,Impaired sensation,Impaired tone,Impaired UE functional use,Improper body mechanics,Postural dysfunction,Cardiopulmonary status limiting activity,Impaired flexibility,Increased muscle spasms  Visit Diagnosis: Multiple sclerosis exacerbation (HCC)  Muscle weakness (generalized)  Other abnormalities of gait and mobility  Unsteadiness on feet     Problem List Patient Active Problem List   Diagnosis Date Noted  . Hypoalbuminemia due to protein-calorie malnutrition ([place]   . Neurogenic bowel   . Neurogenic bladder   . Labile blood pressure   . Neuropathic pain   . Abscess of female pelvis   . SVT (supraventricular tachycardia) ([place]   . Radial styloid tenosynovitis 03/12/2018  . Wheelchair confinement 02/27/2018  . Localized osteoporosis with current pathological fracture with routine healing 01/19/2017  . Wrist fracture 01/16/2017  . Sprain of ankle 03/23/2016  . Closed fracture of  lateral malleolus 03/16/2016  . Health care maintenance 01/24/2016  . Blood pressure elevated without history of HTN 10/25/2015  . Essential hypertension 10/25/2015  . Multiple sclerosis ([place] 10/02/2015  . Chronic left shoulder pain 07/19/2015  . Multiple sclerosis exacerbation ([place] 07/14/2015  . MS (multiple sclerosis) ([place] 11/26/2014  . Increased body mass index 11/26/2014  . HPV test positive 11/26/2014  . Status post laparoscopic supracervical hysterectomy 11/26/2014  . Galactorrhea 11/26/2014  . Back ache 05/21/2014  . Adiposity 05/21/2014  . Disordered sleep 05/21/2014  . Muscle spasticity 05/21/2014  . Spasticity 05/21/2014  . Calculus of kidney 12/09/2013  . Renal colic 070/05/7492 . Hypercholesteremia 08/19/2013  . Hereditary and idiopathic neuropathy 08/19/2013  . Hypercholesterolemia without hypertriglyceridemia 08/19/2013  . Bladder infection, chronic 07/25/2012  . Disorder of bladder function 07/25/2012  . Incomplete bladder emptying 07/25/2012  . Microscopic hematuria 07/25/2012  . Right upper quadrant pain 07/25/2012   MJanna Arch PT, DPT   04/29/2020, 3:38 PM  CDeer CreekMAIN REdward Mccready [facility] HospitalSERVICES [address]RFaribault [place] [address]Phone: [phone]  Fax:  [phone] Name: JManal KreutzerRamseur MRN: [id number] of Birth: 6Jun 19, 1962

## 2020-05-03 ENCOUNTER — Other Ambulatory Visit: Payer: Self-pay | Admitting: Neurology

## 2020-05-03 DIAGNOSIS — Z5181 Encounter for therapeutic drug level monitoring: Secondary | ICD-10-CM

## 2020-05-04 ENCOUNTER — Other Ambulatory Visit: Payer: Self-pay

## 2020-05-04 ENCOUNTER — Ambulatory Visit
Admission: RE | Admit: 2020-05-04 | Discharge: 2020-05-04 | Disposition: A | Discharge status: Home / Self Care | Payer: PPO | Source: Ambulatory Visit | Attending: Internal Medicine | Admitting: Internal Medicine

## 2020-05-04 ENCOUNTER — Ambulatory Visit: Payer: PPO

## 2020-05-04 DIAGNOSIS — G35 Multiple sclerosis: Secondary | ICD-10-CM | POA: Diagnosis not present

## 2020-05-04 DIAGNOSIS — M6281 Muscle weakness (generalized): Secondary | ICD-10-CM

## 2020-05-04 DIAGNOSIS — Z1231 Encounter for screening mammogram for malignant neoplasm of breast: Secondary | ICD-10-CM | POA: Insufficient documentation

## 2020-05-04 DIAGNOSIS — R2689 Other abnormalities of gait and mobility: Secondary | ICD-10-CM

## 2020-05-04 DIAGNOSIS — R2681 Unsteadiness on feet: Secondary | ICD-10-CM

## 2020-05-04 NOTE — Therapy (Signed)
[place] [facility] [address] [place], [place], [address] Phone: [phone]   Fax:  [phone]  Physical Therapy Treatment Physical Therapy Progress Note   Dates of reporting period  03/23/20   to   05/04/20  Patient Details  Name: Calle Schader Cheever MRN: [id number] Date of Birth: 1961/01/17 Referring Provider (PT): Gurney Maxin, MD   Encounter Date: 05/04/2020   PT End of Session - 05/04/20 1009    Visit Number 60    Number of Visits 72    Date for PT Re-Evaluation 06/01/20    Authorization Type 10/10 (PN done today)    PT Start Time 1015    PT Stop Time 1059    PT Time Calculation (min) 44 min    Equipment Utilized During Treatment Gait belt;Other (comment)   ice pack   Activity Tolerance Patient tolerated treatment well    Behavior During Therapy WFL for tasks assessed/performed           Past Medical History:  Diagnosis Date  . Abdominal pain, right upper quadrant   . Back pain   . Calculus of kidney 12/09/2013  . Chronic back pain    unspecified  . Chronic left shoulder pain 07/19/2015  . Complication of anesthesia   . Functional disorder of bladder    other  . Galactorrhea 11/26/2014   Chronic   . Hereditary and idiopathic neuropathy 08/19/2013  . HPV test positive   . Hypercholesteremia 08/19/2013  . Hypertension   . Incomplete bladder emptying   . Microscopic hematuria   . MS (multiple sclerosis) ([place])   . Muscle spasticity 05/21/2014  . Nonspecific findings on examination of urine    other  . Osteopenia   . PONV (postoperative nausea and vomiting)   . Status post laparoscopic supracervical hysterectomy 11/26/2014  . Tobacco user 11/26/2014  . Wrist fracture     Past Surgical History:  Procedure Laterality Date  . bilateral tubal ligation  1996  . BREAST CYST EXCISION Left 2002  . FRACTURE SURGERY    . KNEE SURGERY     right  . LAPAROSCOPIC SUPRACERVICAL HYSTERECTOMY  08/05/2013  .  ORIF WRIST FRACTURE Left 01/17/2017   Procedure: OPEN REDUCTION INTERNAL FIXATION (ORIF) WRIST FRACTURE;  Surgeon: Lovell Sheehan, MD;  Location: [facility];  Service: Orthopedics;  Laterality: Left;  . RADIOLOGY WITH ANESTHESIA N/A 03/18/2020   Procedure: MRI WITH ANESTHESIA CERVICAL SPINE AND BRAIN  WITH AND WITHOUT CONTRAST;  Surgeon: Radiologist, Medication, MD;  Location: [facility];  Service: Radiology;  Laterality: N/A;  . TUBAL LIGATION Bilateral   . VAGINAL HYSTERECTOMY  03/2006    There were no vitals filed for this visit.   Subjective Assessment - 05/04/20 1011    Subjective Pt reports her back is feeling about the same.  She has contacted her neurologist to ask questions about new medication and getting labwork.    Patient is accompained by: Family member    Pertinent History Patient is previous patient of this therapist, was admitted to the hospital on 08/03/19 for MS exacerbation and then went to inpatient rehab on 08/08/19. Patient then received home health therapy and now is returning to outpatient PT. PMh includes HTN, SVT, neurogenic bowel, MS (1995), neuropathy, hx of wrist fx, HPV, Hypercholesteremia, adiposity, osteopenia, spasticity. She wears bilateral AFOs and uses a RW and/or wheelchair for mobility. She drives an adapted car.    Limitations Lifting;Standing;Walking;House hold activities    How long can  you sit comfortably? n/a    How long can you stand comfortably? 4 minutes    How long can you walk comfortably? walked 50 ft in inpatient rehab    Currently in Pain? Yes    Pain Score 3     Pain Location Back    Pain Onset Yesterday            Treatment Today: Standing: In parallel bars: -X10 mini squats, pt relying heavily on UE support -staggered stance L foot in front, R behind to initiate R hip flexion movement for stepping R foot up onto step, pt unable to clear 4 inch step or 2 inch step with multiple attempts of each, transitioned to practicing movement pattern  with "kicking" small round hedgehog on floor  Seated:  Cross body rotation 2x 60 seconds for core activation and stabilization in midline.  Forward/backward trunk lean for modified seated sit up x 12; very challenging  Green swiss ball:  -TrA contraction pressing swiss ball between knees and hands 10x 3 second holds T-rA contraction pressing swiss ball between knees and hands with UE raise 10x each UE -Overhead with trunk extension lean swiss ball raise 10x with cues for arc of motion to PT hand   isometric contraction against PT resistance: -hip flexion 10x 3 second hold -hip abduction 10x 3 second hold -hip adduction 10x 3 second holds  -LAQ x10 with min A ea LE       PT Education - 05/04/20 1009    Education provided Yes    Education Details exercise technique    Person(s) Educated Patient    Methods Explanation;Demonstration;Verbal cues    Comprehension Verbalized understanding;Returned demonstration;Verbal cues required            PT Short Term Goals - 05/04/20 1010      PT SHORT TERM GOAL #1   Title Patient will be independent in home exercise program to improve strength/mobility for better functional independence with ADLs.    Baseline 5/19: HEP next session 6/22: HEP compliant 8/10 : HEP progressions compliant 8/31 HEP compliant 10/5: HEP compliant    Time 4    Period Weeks    Status Achieved    Target Date 01/13/20      PT SHORT TERM GOAL #2   Title Patient will perform a sit to stand with SUE support for increased ind. with transfers.    Baseline 5/19: heavy BUE support 6/22: requires one hand on walker one hand on w/c 8/10: able to perform intermittently (some days can some days cannot) 8/31: attempt next session 10/5: BUE reliance, patient unable to complete 4 full stands. Attempt next session    Time 4    Period Weeks    Status Partially Met    Target Date 01/13/20             PT Long Term Goals - 03/09/20 0001      PT LONG TERM GOAL #1   Title  Patient will increase FOTO score to equal to or greater than  53/100   to demonstrate statistically significant improvement in mobility and quality of life.     Baseline 8/31: 48.13% 7/27:  48.1% 6/22: 46.99% 5/18: 48/100 10/5: 49/100 11/2: 48%     Time 12    Period Weeks    Status Partially Met    Target Date 06/01/20      PT LONG TERM GOAL #2   Title Patient (< 48 years old) will complete five times sit  to stand test in < 20 seconds indicating an increased LE strength and improved balance.    Baseline 7/27: 26.8 seconds with one partial stand 6/22: 40. 86 sec; unable to reach full stand last two attempts 5/19: 42 seconds 10/5: BUE reliance, patient unable to complete 4 full stands. Attempt next session 11/2: 23.71 seconds    Time 12    Status Partially Met    Target Date 06/01/20      PT LONG TERM GOAL #3   Title Patient will increase 10 meter walk test to <20 seconds as to improve gait speed for better community ambulation and to reduce fall risk.    Baseline  8/31: 35.1 seconds with RW 7/27: 36 seconds with RW 6/22: 36.1 seconds 5/19: 42 seconds 10/5: 31.9 seconds with RW 11/2: 29.9 seconds with RW     Time 12    Period Weeks    Status Partially Met    Target Date 06/01/20      PT LONG TERM GOAL #4   Title Patient will reduce timed up and go to <11 seconds to reduce fall risk and demonstrate improved transfer/gait ability.    Baseline  8/31: 50.8 seconds with RW 7/27: 43.65 6/22: 45.4 seconds 5/19: 47 seconds 10/5: 38.5 seconds with RW 11/2: 35.72 seconds with RW     Time 12    Period Weeks    Status Partially Met    Target Date 06/01/20      PT LONG TERM GOAL #5   Title Patient will ambulate from physical therapy gym to elevator down the hall, ride elevator to first floor, and walk to valet with RW and chair follow for increased capacity for mobility and ind.    Baseline 8/31: able to open door ~50 ft with one rest break 8/10: unable to ambulate to elevator without rest break  9/30: able to ambulate to elevator with 3 rest breaks    Time 12    Period Weeks    Status Partially Met    Target Date 06/01/20      PT LONG TERM GOAL #6   Title Patient will be able to stand with RW and lift one leg onto 4" step for carryover to negotiation of steps at home.     Baseline 11/2: unable to perform     Time 12    Period Weeks    Status New    Target Date 06/01/20                 Plan - 05/04/20 1211    Clinical Impression Statement Pt able to perform seated and standing therapeutic exercises today; held off on amb with RW as she did not have her device with her today.  Attempted R hip flexion with placement of foot onto 4 inch step to simulate her home environment 1 step into garage; pt unable to actively flex hip to clear foot and place it on step; also attempted 2 inch and this was too high.  Overall she should cont to benefit from skilled PT interventions to maximize QOL, safety, and functional mobility.    Personal Factors and Comorbidities Age;Comorbidity 3+;Fitness;Past/Current Experience;Sex;Time since onset of injury/illness/exacerbation    Comorbidities HTN, SVT, neurogenic bowel, MS (1995), neuropathy, hx of wrist fx, HPV, Hypercholesteremia, adiposity, osteopenia, spasticity.    Examination-Activity Limitations Bathing;Bed Mobility;Bend;Carry;Continence;Dressing;Hygiene/Grooming;Stairs;Squat;Reach Overhead;Locomotion Level;Lift;Stand;Transfers    Examination-Participation Restrictions Church;Cleaning;Community Activity;Laundry;Volunteer;Shop;Meal Prep;Yard Work    [employer] Evolving/Moderate complexity    Rehab Potential Good  PT Frequency 2x / week    PT Duration 12 weeks    PT Treatment/Interventions ADLs/Self Care Home Management;Aquatic Therapy;Biofeedback;Cryotherapy;Electrical Stimulation;Iontophoresis 92m/ml Dexamethasone;Traction;Moist Heat;Ultrasound;DME Instruction;Gait training;Stair training;Functional mobility  training;Therapeutic activities;Therapeutic exercise;Balance training;Neuromuscular re-education;Patient/family education;Orthotic Fit/Training;Wheelchair mobility training;Manual techniques;Compression bandaging;Passive range of motion;Dry needling;Energy conservation;Taping;Splinting;Vestibular    PT Next Visit Plan step negotiation, turning directions to the L and R    PT Home Exercise Plan maintain current HEP from [place] Agree with Plan of Care Patient           Patient will benefit from skilled therapeutic intervention in order to improve the following deficits and impairments:  Abnormal gait,Decreased activity tolerance,Decreased balance,Decreased coordination,Decreased endurance,Decreased mobility,Decreased range of motion,Decreased strength,Difficulty walking,Impaired perceived functional ability,Impaired sensation,Impaired tone,Impaired UE functional use,Improper body mechanics,Postural dysfunction,Cardiopulmonary status limiting activity,Impaired flexibility,Increased muscle spasms  Visit Diagnosis: Multiple sclerosis exacerbation (HCC)  Muscle weakness (generalized)  Other abnormalities of gait and mobility  Unsteadiness on feet     Problem List Patient Active Problem List   Diagnosis Date Noted  . Hypoalbuminemia due to protein-calorie malnutrition ([place]   . Neurogenic bowel   . Neurogenic bladder   . Labile blood pressure   . Neuropathic pain   . Abscess of female pelvis   . SVT (supraventricular tachycardia) ([place]   . Radial styloid tenosynovitis 03/12/2018  . Wheelchair confinement 02/27/2018  . Localized osteoporosis with current pathological fracture with routine healing 01/19/2017  . Wrist fracture 01/16/2017  . Sprain of ankle 03/23/2016  . Closed fracture of lateral malleolus 03/16/2016  . Health care maintenance 01/24/2016  . Blood pressure elevated without history of HTN 10/25/2015  . Essential hypertension 10/25/2015  . Multiple  sclerosis ([place] 10/02/2015  . Chronic left shoulder pain 07/19/2015  . Multiple sclerosis exacerbation ([place] 07/14/2015  . MS (multiple sclerosis) ([place] 11/26/2014  . Increased body mass index 11/26/2014  . HPV test positive 11/26/2014  . Status post laparoscopic supracervical hysterectomy 11/26/2014  . Galactorrhea 11/26/2014  . Back ache 05/21/2014  . Adiposity 05/21/2014  . Disordered sleep 05/21/2014  . Muscle spasticity 05/21/2014  . Spasticity 05/21/2014  . Calculus of kidney 12/09/2013  . Renal colic 019/75/8832 . Hypercholesteremia 08/19/2013  . Hereditary and idiopathic neuropathy 08/19/2013  . Hypercholesterolemia without hypertriglyceridemia 08/19/2013  . Bladder infection, chronic 07/25/2012  . Disorder of bladder function 07/25/2012  . Incomplete bladder emptying 07/25/2012  . Microscopic hematuria 07/25/2012  . Right upper quadrant pain 07/25/2012    RPincus Badder12/28/2021, 12:19 PM RMerdis Delay PT, DPT Physical Therapist - [facility]- [place] [facility] ClinicSERV[facility] [address] [place] [address]Phone: [phone]  Fax:  [phone] Name: JVermell MadridRamseur MRN: [id number] of Birth: [birth year]

## 2020-05-06 ENCOUNTER — Other Ambulatory Visit: Payer: Self-pay

## 2020-05-06 ENCOUNTER — Ambulatory Visit: Payer: PPO

## 2020-05-06 DIAGNOSIS — M6281 Muscle weakness (generalized): Secondary | ICD-10-CM

## 2020-05-06 DIAGNOSIS — G35 Multiple sclerosis: Secondary | ICD-10-CM

## 2020-05-06 DIAGNOSIS — R2689 Other abnormalities of gait and mobility: Secondary | ICD-10-CM

## 2020-05-06 DIAGNOSIS — R2681 Unsteadiness on feet: Secondary | ICD-10-CM

## 2020-05-06 NOTE — Therapy (Signed)
[place] [facility] [address] [place], [place], [address] Phone: [phone]   Fax:  [phone]  Physical Therapy Treatment  Patient Details  Name: Lisa Williams MRN: [id number] Date of Birth: 21-Oct-1960 Referring Provider (PT): Gurney Maxin, MD   Encounter Date: 05/06/2020   PT End of Session - 05/06/20 1236    Visit Number 20    Number of Visits 72    Date for PT Re-Evaluation 06/01/20    Authorization Type 1/10    PT Start Time 1015    PT Stop Time 1101    PT Time Calculation (min) 46 min    Equipment Utilized During Treatment Gait belt;Other (comment)   ice pack   Activity Tolerance Patient tolerated treatment well    Behavior During Therapy WFL for tasks assessed/performed           Past Medical History:  Diagnosis Date  . Abdominal pain, right upper quadrant   . Back pain   . Calculus of kidney 12/09/2013  . Chronic back pain    unspecified  . Chronic left shoulder pain 07/19/2015  . Complication of anesthesia   . Functional disorder of bladder    other  . Galactorrhea 11/26/2014   Chronic   . Hereditary and idiopathic neuropathy 08/19/2013  . HPV test positive   . Hypercholesteremia 08/19/2013  . Hypertension   . Incomplete bladder emptying   . Microscopic hematuria   . MS (multiple sclerosis) ([facility])   . Muscle spasticity 05/21/2014  . Nonspecific findings on examination of urine    other  . Osteopenia   . PONV (postoperative nausea and vomiting)   . Status post laparoscopic supracervical hysterectomy 11/26/2014  . Tobacco user 11/26/2014  . Wrist fracture     Past Surgical History:  Procedure Laterality Date  . bilateral tubal ligation  1996  . BREAST CYST EXCISION Left 2002  . FRACTURE SURGERY    . KNEE SURGERY     right  . LAPAROSCOPIC SUPRACERVICAL HYSTERECTOMY  08/05/2013  . ORIF WRIST FRACTURE Left 01/17/2017   Procedure: OPEN REDUCTION INTERNAL FIXATION (ORIF) WRIST FRACTURE;   Surgeon: Lovell Sheehan, MD;  Location: [facility];  Service: Orthopedics;  Laterality: Left;  . RADIOLOGY WITH ANESTHESIA N/A 03/18/2020   Procedure: MRI WITH ANESTHESIA CERVICAL SPINE AND BRAIN  WITH AND WITHOUT CONTRAST;  Surgeon: Radiologist, Medication, MD;  Location: [facility];  Service: Radiology;  Laterality: N/A;  . TUBAL LIGATION Bilateral   . VAGINAL HYSTERECTOMY  03/2006    There were no vitals filed for this visit.   Subjective Assessment - 05/06/20 1235    Subjective Patient reports spasms and swelling of her limbs. Physician is aware of new spasming.    Patient is accompained by: Family member    Pertinent History Patient is previous patient of this therapist, was admitted to the hospital on 08/03/19 for MS exacerbation and then went to inpatient rehab on 08/08/19. Patient then received home health therapy and now is returning to outpatient PT. PMh includes HTN, SVT, neurogenic bowel, MS (1995), neuropathy, hx of wrist fx, HPV, Hypercholesteremia, adiposity, osteopenia, spasticity. She wears bilateral AFOs and uses a RW and/or wheelchair for mobility. She drives an adapted car.    Limitations Lifting;Standing;Walking;House hold activities    How long can you sit comfortably? n/a    How long can you stand comfortably? 4 minutes    How long can you walk comfortably? walked 50 ft in inpatient rehab  Currently in Pain? Yes    Pain Score 4     Pain Location Back    Pain Orientation Lower    Pain Descriptors / Indicators Aching    Pain Type Chronic pain    Pain Onset Yesterday    Pain Frequency Intermittent             Goals;    FOTO: 46.9 TUG: 43.58 wheels wet: 48 seconds second attempt  10 MWT: deferred due to spasm 5x STS: 30.32 one near LOB    Treatment:    Patient standing interventions terminated due to spasm of back.   Seated:  STM to lumbar spine with focus on L parapsinals and quadratus lumborum x 9 minutes; patient reports relief of spasm by end of session.   5 rounds of seated boxing with cross body coordination, spatial awareness, and stabilization of 30-60 seconds   Ice pack donned throughout session to minimize exacerbation of MS symptoms in order to perform interventions.  Pt educated throughout session about proper posture and technique with exercises. Improved exercise technique, movement at target joints, use of target muscles after min to mod verbal, visual, tactile cues.                  PT Education - 05/06/20 1236    Education provided Yes    Education Details exercise technique, body mechanics    Person(s) Educated Patient    Methods Explanation;Demonstration;Tactile cues;Verbal cues;Handout    Comprehension Verbalized understanding;Returned demonstration;Verbal cues required;Tactile cues required            PT Short Term Goals - 05/04/20 1010      PT SHORT TERM GOAL #1   Title Patient will be independent in home exercise program to improve strength/mobility for better functional independence with ADLs.    Baseline 5/19: HEP next session 6/22: HEP compliant 8/10 : HEP progressions compliant 8/31 HEP compliant 10/5: HEP compliant    Time 4    Period Weeks    Status Achieved    Target Date 01/13/20      PT SHORT TERM GOAL #2   Title Patient will perform a sit to stand with SUE support for increased ind. with transfers.    Baseline 5/19: heavy BUE support 6/22: requires one hand on walker one hand on w/c 8/10: able to perform intermittently (some days can some days cannot) 8/31: attempt next session 10/5: BUE reliance, patient unable to complete 4 full stands. Attempt next session    Time 4    Period Weeks    Status Partially Met    Target Date 01/13/20             PT Long Term Goals - 05/06/20 0001      PT LONG TERM GOAL #1   Title Patient will increase FOTO score to equal to or greater than  53/100   to demonstrate statistically significant improvement in mobility and quality of life.      Baseline 8/31: 48.13% 7/27:  48.1% 6/22: 46.99% 5/18: 48/100 10/5: 49/100 11/2: 48%  12/30: 46.9%    Time 12    Period Weeks    Status Partially Met    Target Date 06/01/20      PT LONG TERM GOAL #2   Title Patient (< 60 years old) will complete five times sit to stand test in < 20 seconds indicating an increased LE strength and improved balance.    Baseline 7/27: 26.8 seconds with one partial stand 6/22:   40. 86 sec; unable to reach full stand last two attempts 5/19: 42 seconds 10/5: BUE reliance, patient unable to complete 4 full stands. Attempt next session 11/2: 23.71 seconds 12/30: 30.32 with one near LOB due to spasms    Time 12    Period Weeks    Status Partially Met    Target Date 06/01/20      PT LONG TERM GOAL #3   Title Patient will increase 10 meter walk test to <20 seconds as to improve gait speed for better community ambulation and to reduce fall risk.    Baseline 8/31: 35.1 seconds with RW 7/27: 36 seconds with RW 6/22: 36.1 seconds 5/19: 42 seconds 10/5: 31.9 seconds with RW 11/2: 29.9 seconds with RW 12/30: deferred due to spasm    Time 12    Period Weeks    Status On-going    Target Date 06/01/20      PT LONG TERM GOAL #4   Title Patient will reduce timed up and go to <11 seconds to reduce fall risk and demonstrate improved transfer/gait ability.    Baseline 8/31: 50.8 seconds with RW 7/27: 43.65 6/22: 45.4 seconds 5/19: 47 seconds 10/5: 38.5 seconds with RW 11/2: 35.72 seconds with RW 12/30: 43.5 seconds with wheels wet.    Time 12    Period Weeks    Status Partially Met    Target Date 06/01/20      PT LONG TERM GOAL #5   Title Patient will ambulate from physical therapy gym to elevator down the hall, ride elevator to first floor, and walk to valet with RW and chair follow for increased capacity for mobility and ind.    Baseline 8/31: able to open door ~50 ft with one rest break 8/10: unable to ambulate to elevator without rest break 9/30: able to ambulate to  elevator with 3 rest breaks    Time 12    Period Weeks    Status Partially Met    Target Date 06/01/20      PT LONG TERM GOAL #6   Title Patient will be able to stand with RW and lift one leg onto 4" step for carryover to negotiation of steps at home.     Baseline 11/2: unable to perform     Time 12    Period Weeks    Status New    Target Date 06/01/20                 Plan - 05/06/20 1245    Clinical Impression Statement Patient's goals limited due to spasms in back and swelling of limbs. Patient's physician is aware of changes to swelling and weakness of limb s/p medicine change. Patient challenged with standing with frequent spasms and near LOB. Manual to low back reduced spasms but did not eliminate them completely. The patient would benefit from further skilled PT intervention to maximize QOL, safety, and functional mobility    Personal Factors and Comorbidities Age;Comorbidity 3+;Fitness;Past/Current Experience;Sex;Time since onset of injury/illness/exacerbation    Comorbidities HTN, SVT, neurogenic bowel, MS (1995), neuropathy, hx of wrist fx, HPV, Hypercholesteremia, adiposity, osteopenia, spasticity.    Examination-Activity Limitations Bathing;Bed Mobility;Bend;Carry;Continence;Dressing;Hygiene/Grooming;Stairs;Squat;Reach Overhead;Locomotion Level;Lift;Stand;Transfers    Examination-Participation Restrictions Church;Cleaning;Community Activity;Laundry;Volunteer;Shop;Meal Prep;Yard Work    Stability/Clinical Decision Making Evolving/Moderate complexity    Rehab Potential Good    PT Frequency 2x / week    PT Duration 12 weeks    PT Treatment/Interventions ADLs/Self Care Home Management;Aquatic Therapy;Biofeedback;Cryotherapy;Electrical Stimulation;Iontophoresis 4mg/ml Dexamethasone;Traction;Moist Heat;Ultrasound;DME Instruction;Gait training;Stair training;Functional   mobility training;Therapeutic activities;Therapeutic exercise;Balance training;Neuromuscular  re-education;Patient/family education;Orthotic Fit/Training;Wheelchair mobility training;Manual techniques;Compression bandaging;Passive range of motion;Dry needling;Energy conservation;Taping;Splinting;Vestibular    PT Next Visit Plan step negotiation, turning directions to the L and R    PT Home Exercise Plan maintain current HEP from [facility]    Consulted and Agree with Plan of Care Patient           Patient will benefit from skilled therapeutic intervention in order to improve the following deficits and impairments:  Abnormal gait,Decreased activity tolerance,Decreased balance,Decreased coordination,Decreased endurance,Decreased mobility,Decreased range of motion,Decreased strength,Difficulty walking,Impaired perceived functional ability,Impaired sensation,Impaired tone,Impaired UE functional use,Improper body mechanics,Postural dysfunction,Cardiopulmonary status limiting activity,Impaired flexibility,Increased muscle spasms  Visit Diagnosis: Multiple sclerosis exacerbation (HCC)  Muscle weakness (generalized)  Other abnormalities of gait and mobility  Unsteadiness on feet     Problem List Patient Active Problem List   Diagnosis Date Noted  . Hypoalbuminemia due to protein-calorie malnutrition ([facility])   . Neurogenic bowel   . Neurogenic bladder   . Labile blood pressure   . Neuropathic pain   . Abscess of female pelvis   . SVT (supraventricular tachycardia) ([facility])   . Radial styloid tenosynovitis 03/12/2018  . Wheelchair confinement 02/27/2018  . Localized osteoporosis with current pathological fracture with routine healing 01/19/2017  . Wrist fracture 01/16/2017  . Sprain of ankle 03/23/2016  . Closed fracture of lateral malleolus 03/16/2016  . Health care maintenance 01/24/2016  . Blood pressure elevated without history of HTN 10/25/2015  . Essential hypertension 10/25/2015  . Multiple sclerosis (HCC) 10/02/2015  . Chronic left shoulder pain 07/19/2015  . Multiple sclerosis  exacerbation ([facility]CC) 07/14/2015  . MS (multiple sclerosis) ([facility]) 11/26/2014  . Increased body mass index 11/26/2014  . HPV test positive 11/26/2014  . Status post laparoscopic supracervical hysterectomy 11/26/2014  . Galactorrhea 11/26/2014  . Back ache 05/21/2014  . Adiposity 05/21/2014  . Disordered sleep 05/21/2014  . Muscle spasticity 05/21/2014  . Spasticity 05/21/2014  . Calculus of kidney 12/09/2013  . Renal colic 12/09/2013  . Hypercholesteremia 08/19/2013  . Hereditary and idiopathic neuropathy 08/19/2013  . Hypercholesterolemia without hypertriglyceridemia 08/19/2013  . Bladder infection, chronic 07/25/2012  . Disorder of bladder function 07/25/2012  . Incomplete bladder emptying 07/25/2012  . Microscopic hematuria 07/25/2012  . Right upper quadrant pain 07/25/2012    , PT, DPT   05/06/2020, 12:47 PM  Lisa [facility] [address] [place], [place], [address] Phone: [phone]   Fax:  [phone]  Name: Lisa Williams MRN: [id number] Date of Birth: 05/08/1960   

## 2020-05-11 ENCOUNTER — Other Ambulatory Visit: Payer: Self-pay

## 2020-05-11 ENCOUNTER — Ambulatory Visit: Payer: PPO | Attending: Neurology

## 2020-05-11 DIAGNOSIS — G35 Multiple sclerosis: Secondary | ICD-10-CM | POA: Diagnosis not present

## 2020-05-11 DIAGNOSIS — R2689 Other abnormalities of gait and mobility: Secondary | ICD-10-CM | POA: Diagnosis not present

## 2020-05-11 DIAGNOSIS — M5412 Radiculopathy, cervical region: Secondary | ICD-10-CM | POA: Insufficient documentation

## 2020-05-11 DIAGNOSIS — R2681 Unsteadiness on feet: Secondary | ICD-10-CM | POA: Diagnosis not present

## 2020-05-11 DIAGNOSIS — M6281 Muscle weakness (generalized): Secondary | ICD-10-CM | POA: Diagnosis not present

## 2020-05-11 NOTE — Therapy (Signed)
[place] MAIN [facility] SERVICES [address] [place], [place], [address] Phone: [phone]   Fax:  [phone]  Physical Therapy Treatment  Patient Details  Name: Lisa Williams MRN: [id number] Date of Birth: 21-Nov-1960 Referring Provider (PT): Gurney Maxin, MD   Encounter Date: 05/11/2020   PT End of Session - 05/11/20 1037    Visit Number [id number]    Number of Visits 72    Date for PT Re-Evaluation 06/01/20    Authorization Type 2/10    PT Start Time 1015    PT Stop Time 1054    PT Time Calculation (min) 39 min    Equipment Utilized During Treatment Gait belt;Other (comment)   ice pack   Activity Tolerance Patient tolerated treatment well    Behavior During Therapy WFL for tasks assessed/performed           Past Medical History:  Diagnosis Date  . Abdominal pain, right upper quadrant   . Back pain   . Calculus of kidney 12/09/2013  . Chronic back pain    unspecified  . Chronic left shoulder pain 07/19/2015  . Complication of anesthesia   . Functional disorder of bladder    other  . Galactorrhea 11/26/2014   Chronic   . Hereditary and idiopathic neuropathy 08/19/2013  . HPV test positive   . Hypercholesteremia 08/19/2013  . Hypertension   . Incomplete bladder emptying   . Microscopic hematuria   . MS (multiple sclerosis) ([place])   . Muscle spasticity 05/21/2014  . Nonspecific findings on examination of urine    other  . Osteopenia   . PONV (postoperative nausea and vomiting)   . Status post laparoscopic supracervical hysterectomy 11/26/2014  . Tobacco user 11/26/2014  . Wrist fracture     Past Surgical History:  Procedure Laterality Date  . bilateral tubal ligation  1996  . BREAST CYST EXCISION Left 2002  . FRACTURE SURGERY    . KNEE SURGERY     right  . LAPAROSCOPIC SUPRACERVICAL HYSTERECTOMY  08/05/2013  . ORIF WRIST FRACTURE Left 01/17/2017   Procedure: OPEN REDUCTION INTERNAL FIXATION (ORIF) WRIST FRACTURE;   Surgeon: Lovell Sheehan, MD;  Location: [facility];  Service: Orthopedics;  Laterality: Left;  . RADIOLOGY WITH ANESTHESIA N/A 03/18/2020   Procedure: MRI WITH ANESTHESIA CERVICAL SPINE AND BRAIN  WITH AND WITHOUT CONTRAST;  Surgeon: Radiologist, Medication, MD;  Location: [place];  Service: Radiology;  Laterality: N/A;  . TUBAL LIGATION Bilateral   . VAGINAL HYSTERECTOMY  03/2006    There were no vitals filed for this visit.   Subjective Assessment - 05/11/20 1019    Subjective Patient reported so far today she is doing well    Pertinent History Patient is previous patient of this therapist, was admitted to the hospital on 08/03/19 for MS exacerbation and then went to inpatient rehab on 08/08/19. Patient then received home health therapy and now is returning to outpatient PT. PMh includes HTN, SVT, neurogenic bowel, MS (1995), neuropathy, hx of wrist fx, HPV, Hypercholesteremia, adiposity, osteopenia, spasticity. She wears bilateral AFOs and uses a RW and/or wheelchair for mobility. She drives an adapted car.    Limitations Lifting;Standing;Walking;House hold activities    How long can you sit comfortably? n/a    How long can you stand comfortably? 4 minutes    How long can you walk comfortably? walked 50 ft in inpatient rehab             Standing in //  bars: -Standing weight shifts 2x10 each side; UE support focus on hip shift rather than pull with arm -Knee bends/ mini squat5x with UE support and w/c behind, 2 sets with seated rest break between sets -6" step (on horizontal side) Bend over and reach, pick upcone and pass midline (4 cones, and pt reset cones after picking up all cones) seated rest breaks between sets (two sets total)   Seated: Swiss ball forward and lateral rollout 8x each direction.  Trunk isometrics with Pt resistance 3x30seconds ea direction Anterior and Posterior trunk leans x10    Ice pack donned throughout session to minimize exacerbation of MS symptoms  in order to perform interventions.   pt response/clinical impression: Pt with improved tolerance to upright exercises this session, decreased pt reported back spasms. She did exhibit fatigue while standing but very motivated to perform as many interventions as able. The patient would benefit from continued skilled PT intervention to maximize QOL, safety, and function.      PT Education - 05/11/20 1150    Education provided Yes    Education Details exercise technique, body mechanics    Person(s) Educated Patient    Methods Explanation;Demonstration;Tactile cues;Verbal cues    Comprehension Verbalized understanding;Returned demonstration;Verbal cues required;Tactile cues required            PT Short Term Goals - 05/04/20 1010      PT SHORT TERM GOAL #1   Title Patient will be independent in home exercise program to improve strength/mobility for better functional independence with ADLs.    Baseline 5/19: HEP next session 6/22: HEP compliant 8/10 : HEP progressions compliant 8/31 HEP compliant 10/5: HEP compliant    Time 4    Period Weeks    Status Achieved    Target Date 01/13/20      PT SHORT TERM GOAL #2   Title Patient will perform a sit to stand with SUE support for increased ind. with transfers.    Baseline 5/19: heavy BUE support 6/22: requires one hand on walker one hand on w/c 8/10: able to perform intermittently (some days can some days cannot) 8/31: attempt next session 10/5: BUE reliance, patient unable to complete 4 full stands. Attempt next session    Time 4    Period Weeks    Status Partially Met    Target Date 01/13/20             PT Long Term Goals - 05/06/20 0001      PT LONG TERM GOAL #1   Title Patient will increase FOTO score to equal to or greater than  53/100   to demonstrate statistically significant improvement in mobility and quality of life.     Baseline 8/31: 48.13% 7/27:  48.1% 6/22: 46.99% 5/18: 48/100 10/5: 49/100 11/2: 48%  12/30: 46.9%     Time 12    Period Weeks    Status Partially Met    Target Date 06/01/20      PT LONG TERM GOAL #2   Title Patient (< 73 years old) will complete five times sit to stand test in < 20 seconds indicating an increased LE strength and improved balance.    Baseline 7/27: 26.8 seconds with one partial stand 6/22: 40. 86 sec; unable to reach full stand last two attempts 5/19: 42 seconds 10/5: BUE reliance, patient unable to complete 4 full stands. Attempt next session 11/2: 23.71 seconds 12/30: 30.32 with one near LOB due to spasms    Time 12  Period Weeks    Status Partially Met    Target Date 06/01/20      PT LONG TERM GOAL #3   Title Patient will increase 10 meter walk test to <20 seconds as to improve gait speed for better community ambulation and to reduce fall risk.    Baseline 8/31: 35.1 seconds with RW 7/27: 36 seconds with RW 6/22: 36.1 seconds 5/19: 42 seconds 10/5: 31.9 seconds with RW 11/2: 29.9 seconds with RW 12/30: deferred due to spasm    Time 12    Period Weeks    Status On-going    Target Date 06/01/20      PT LONG TERM GOAL #4   Title Patient will reduce timed up and go to <11 seconds to reduce fall risk and demonstrate improved transfer/gait ability.    Baseline 8/31: 50.8 seconds with RW 7/27: 43.65 6/22: 45.4 seconds 5/19: 47 seconds 10/5: 38.5 seconds with RW 11/2: 35.72 seconds with RW 12/30: 43.5 seconds with wheels wet.    Time 12    Period Weeks    Status Partially Met    Target Date 06/01/20      PT LONG TERM GOAL #5   Title Patient will ambulate from physical therapy gym to elevator down the hall, ride elevator to first floor, and walk to valet with RW and chair follow for increased capacity for mobility and ind.    Baseline 8/31: able to open door ~50 ft with one rest break 8/10: unable to ambulate to elevator without rest break 9/30: able to ambulate to elevator with 3 rest breaks    Time 12    Period Weeks    Status Partially Met    Target Date 06/01/20       PT LONG TERM GOAL #6   Title Patient will be able to stand with RW and lift one leg onto 4" step for carryover to negotiation of steps at home.     Baseline 11/2: unable to perform     Time 12    Period Weeks    Status New    Target Date 06/01/20                 Plan - 05/11/20 1150    Clinical Impression Statement Pt with improved tolerance to upright exercises this session, decreased pt reported back spasms. She did exhibit fatigue while standing but very motivated to perform as many interventions as able. The patient would benefit from continued skilled PT intervention to maximize QOL, safety, and function.    Personal Factors and Comorbidities Age;Comorbidity 3+;Fitness;Past/Current Experience;Sex;Time since onset of injury/illness/exacerbation    Comorbidities HTN, SVT, neurogenic bowel, MS (1995), neuropathy, hx of wrist fx, HPV, Hypercholesteremia, adiposity, osteopenia, spasticity.    Examination-Activity Limitations Bathing;Bed Mobility;Bend;Carry;Continence;Dressing;Hygiene/Grooming;Stairs;Squat;Reach Overhead;Locomotion Level;Lift;Stand;Transfers    Examination-Participation Restrictions Church;Cleaning;Community Activity;Laundry;Volunteer;Shop;Meal Prep;Yard Work    [employer] Evolving/Moderate complexity    Rehab Potential Good    PT Frequency 2x / week    PT Duration 12 weeks    PT Treatment/Interventions ADLs/Self Care Home Management;Aquatic Therapy;Biofeedback;Cryotherapy;Electrical Stimulation;Iontophoresis 51m/ml Dexamethasone;Traction;Moist Heat;Ultrasound;DME Instruction;Gait training;Stair training;Functional mobility training;Therapeutic activities;Therapeutic exercise;Balance training;Neuromuscular re-education;Patient/family education;Orthotic Fit/Training;Wheelchair mobility training;Manual techniques;Compression bandaging;Passive range of motion;Dry needling;Energy conservation;Taping;Splinting;Vestibular    PT Next Visit Plan step  negotiation, turning directions to the L and R    PT Home Exercise Plan maintain current HEP from HHPT    Consulted and Agree with Plan of Care Patient  Patient will benefit from skilled therapeutic intervention in order to improve the following deficits and impairments:  Abnormal gait,Decreased activity tolerance,Decreased balance,Decreased coordination,Decreased endurance,Decreased mobility,Decreased range of motion,Decreased strength,Difficulty walking,Impaired perceived functional ability,Impaired sensation,Impaired tone,Impaired UE functional use,Improper body mechanics,Postural dysfunction,Cardiopulmonary status limiting activity,Impaired flexibility,Increased muscle spasms  Visit Diagnosis: Multiple sclerosis exacerbation ([place])  Muscle weakness (generalized)  Other abnormalities of gait and mobility  Unsteadiness on feet  Radiculopathy, cervical region     Problem List Patient Active Problem List   Diagnosis Date Noted  . Hypoalbuminemia due to protein-calorie malnutrition ([place])   . Neurogenic bowel   . Neurogenic bladder   . Labile blood pressure   . Neuropathic pain   . Abscess of female pelvis   . SVT (supraventricular tachycardia) ([place])   . Radial styloid tenosynovitis 03/12/2018  . Wheelchair confinement 02/27/2018  . Localized osteoporosis with current pathological fracture with routine healing 01/19/2017  . Wrist fracture 01/16/2017  . Sprain of ankle 03/23/2016  . Closed fracture of lateral malleolus 03/16/2016  . Health care maintenance 01/24/2016  . Blood pressure elevated without history of HTN 10/25/2015  . Essential hypertension 10/25/2015  . Multiple sclerosis ([place]) 10/02/2015  . Chronic left shoulder pain 07/19/2015  . Multiple sclerosis exacerbation ([place]) 07/14/2015  . MS (multiple sclerosis) ([place]) 11/26/2014  . Increased body mass index 11/26/2014  . HPV test positive 11/26/2014  . Status post laparoscopic supracervical hysterectomy  11/26/2014  . Galactorrhea 11/26/2014  . Back ache 05/21/2014  . Adiposity 05/21/2014  . Disordered sleep 05/21/2014  . Muscle spasticity 05/21/2014  . Spasticity 05/21/2014  . Calculus of kidney 12/09/2013  . Renal colic 56/38/7564  . Hypercholesteremia 08/19/2013  . Hereditary and idiopathic neuropathy 08/19/2013  . Hypercholesterolemia without hypertriglyceridemia 08/19/2013  . Bladder infection, chronic 07/25/2012  . Disorder of bladder function 07/25/2012  . Incomplete bladder emptying 07/25/2012  . Microscopic hematuria 07/25/2012  . Right upper quadrant pain 07/25/2012   Lieutenant Diego PT, DPT 11:52 AM,05/11/20   [place] MAIN [facility] SERVICES [address] [place], [place], [address] Phone: [phone]   Fax:  [phone]  Name: Elizabelle Fite Piatek MRN: [id number] Date of Birth: July 29, 1960

## 2020-05-13 ENCOUNTER — Ambulatory Visit: Payer: PPO

## 2020-05-13 ENCOUNTER — Other Ambulatory Visit: Payer: Self-pay | Admitting: Neurology

## 2020-05-13 ENCOUNTER — Other Ambulatory Visit: Payer: Self-pay

## 2020-05-13 DIAGNOSIS — G35 Multiple sclerosis: Secondary | ICD-10-CM | POA: Diagnosis not present

## 2020-05-13 DIAGNOSIS — Z5181 Encounter for therapeutic drug level monitoring: Secondary | ICD-10-CM | POA: Diagnosis not present

## 2020-05-13 DIAGNOSIS — R2681 Unsteadiness on feet: Secondary | ICD-10-CM

## 2020-05-13 DIAGNOSIS — M6281 Muscle weakness (generalized): Secondary | ICD-10-CM

## 2020-05-13 DIAGNOSIS — R2689 Other abnormalities of gait and mobility: Secondary | ICD-10-CM

## 2020-05-13 NOTE — Therapy (Signed)
[place] [facility]IN [facility] SERVICES [address] [place], [place], [address] Phone: [phone]   Fax:  [phone]  Physical Therapy Treatment  Patient Details  Name: Lisa Williams MRN: [id number] Date of Birth: 01-17-1961 Referring Provider (PT): Gurney Maxin, MD   Encounter Date: 05/13/2020   PT End of Session - 05/13/20 1702    Visit Number 46    Number of Visits 72    Date for PT Re-Evaluation 06/01/20    Authorization Type 3/10    PT Start Time 9381    PT Stop Time 1058    PT Time Calculation (min) 43 min    Equipment Utilized During Treatment Gait belt;Other (comment)   ice pack   Activity Tolerance Patient tolerated treatment well    Behavior During Therapy WFL for tasks assessed/performed           Past Medical History:  Diagnosis Date  . Abdominal pain, right upper quadrant   . Back pain   . Calculus of kidney 12/09/2013  . Chronic back pain    unspecified  . Chronic left shoulder pain 07/19/2015  . Complication of anesthesia   . Functional disorder of bladder    other  . Galactorrhea 11/26/2014   Chronic   . Hereditary and idiopathic neuropathy 08/19/2013  . HPV test positive   . Hypercholesteremia 08/19/2013  . Hypertension   . Incomplete bladder emptying   . Microscopic hematuria   . MS (multiple sclerosis) ([place])   . Muscle spasticity 05/21/2014  . Nonspecific findings on examination of urine    other  . Osteopenia   . PONV (postoperative nausea and vomiting)   . Status post laparoscopic supracervical hysterectomy 11/26/2014  . Tobacco user 11/26/2014  . Wrist fracture     Past Surgical History:  Procedure Laterality Date  . bilateral tubal ligation  1996  . BREAST CYST EXCISION Left 2002  . FRACTURE SURGERY    . KNEE SURGERY     right  . LAPAROSCOPIC SUPRACERVICAL HYSTERECTOMY  08/05/2013  . ORIF WRIST FRACTURE Left 01/17/2017   Procedure: OPEN REDUCTION INTERNAL FIXATION (ORIF) WRIST FRACTURE;   Surgeon: Lovell Sheehan, MD;  Location: [facility];  Service: Orthopedics;  Laterality: Left;  . RADIOLOGY WITH ANESTHESIA N/A 03/18/2020   Procedure: MRI WITH ANESTHESIA CERVICAL SPINE AND BRAIN  WITH AND WITHOUT CONTRAST;  Surgeon: Radiologist, Medication, MD;  Location: [place];  Service: Radiology;  Laterality: N/A;  . TUBAL LIGATION Bilateral   . VAGINAL HYSTERECTOMY  03/2006    There were no vitals filed for this visit.   Subjective Assessment - 05/13/20 1701    Subjective Patient reports increased spasms today. No falls or LOB since last session.    Pertinent History Patient is previous patient of this therapist, was admitted to the hospital on 08/03/19 for MS exacerbation and then went to inpatient rehab on 08/08/19. Patient then received home health therapy and now is returning to outpatient PT. PMh includes HTN, SVT, neurogenic bowel, MS (1995), neuropathy, hx of wrist fx, HPV, Hypercholesteremia, adiposity, osteopenia, spasticity. She wears bilateral AFOs and uses a RW and/or wheelchair for mobility. She drives an adapted car.    Limitations Lifting;Standing;Walking;House hold activities    How long can you sit comfortably? n/a    How long can you stand comfortably? 4 minutes    How long can you walk comfortably? walked 50 ft in inpatient rehab    Currently in Pain? Yes    Pain Score  4     Pain Location Back    Pain Orientation Right;Left    Pain Descriptors / Indicators Aching    Pain Type Neuropathic pain    Pain Onset Yesterday    Pain Frequency Intermittent                 Gait: Patient ambulates 70f, seated rest break then additional 40 ft with RW. CGA and wheelchair follow for safety. Patient able to navigate opening doors, turns, and thresholds independently but with inc time to perform. Patient demonstrates inc reliance on BUE by end of gait trial.Terminated early due to spasms throughout body   Seated:  TrA activation 10x pressing into swiss ball Swiss ball  forward, lateral, and diagonal rollouts 10x each angle  Forward/backward trunk lean for modified seated sit up x 12; very challenging  Hand clap with PT for core activation, sequencing, dual task, and UE strengthening x 45 seconds x 3 trials Hamstring stretch on PT leg 2x30 seconds; x 2 sets  RTB abduction 15x Adduction ball squeeze 15x   isometric contraction against PT resistance: -hip flexion 10x 3 second hold       Ice pack donned throughout session to minimize exacerbation of MS symptoms in order to perform interventions.    Pt educated throughout session about proper posture and technique with exercises. Improved exercise technique, movement at target joints, use of target muscles after min to mod verbal, visual, tactile cues.    Patient's session is limited by muscle spasms requiring termination of standing interventions. Patient is improving her LE strength with decreased need for rest breaks between seated interventions. She tolerated resisted leg abduction this session. She did exhibit fatigue while standing but very motivated to perform as many interventions as able. The patient would benefit from continued skilled PT intervention to maximize QOL, safety, and function.                    PT Education - 05/13/20 1702    Education provided Yes    Education Details exercise technique, body mechanics    Person(s) Educated Patient    Methods Explanation;Demonstration;Tactile cues;Verbal cues    Comprehension Verbalized understanding;Returned demonstration;Verbal cues required;Tactile cues required            PT Short Term Goals - 05/04/20 1010      PT SHORT TERM GOAL #1   Title Patient will be independent in home exercise program to improve strength/mobility for better functional independence with ADLs.    Baseline 5/19: HEP next session 6/22: HEP compliant 8/10 : HEP progressions compliant 8/31 HEP compliant 10/5: HEP compliant    Time 4    Period Weeks     Status Achieved    Target Date 01/13/20      PT SHORT TERM GOAL #2   Title Patient will perform a sit to stand with SUE support for increased ind. with transfers.    Baseline 5/19: heavy BUE support 6/22: requires one hand on walker one hand on w/c 8/10: able to perform intermittently (some days can some days cannot) 8/31: attempt next session 10/5: BUE reliance, patient unable to complete 4 full stands. Attempt next session    Time 4    Period Weeks    Status Partially Met    Target Date 01/13/20             PT Long Term Goals - 05/06/20 0001      PT LONG TERM GOAL #1  Title Patient will increase FOTO score to equal to or greater than  53/100   to demonstrate statistically significant improvement in mobility and quality of life.     Baseline 8/31: 48.13% 7/27:  48.1% 6/22: 46.99% 5/18: 48/100 10/5: 49/100 11/2: 48%  12/30: 46.9%    Time 12    Period Weeks    Status Partially Met    Target Date 06/01/20      PT LONG TERM GOAL #2   Title Patient (< 76 years old) will complete five times sit to stand test in < 20 seconds indicating an increased LE strength and improved balance.    Baseline 7/27: 26.8 seconds with one partial stand 6/22: 40. 86 sec; unable to reach full stand last two attempts 5/19: 42 seconds 10/5: BUE reliance, patient unable to complete 4 full stands. Attempt next session 11/2: 23.71 seconds 12/30: 30.32 with one near LOB due to spasms    Time 12    Period Weeks    Status Partially Met    Target Date 06/01/20      PT LONG TERM GOAL #3   Title Patient will increase 10 meter walk test to <20 seconds as to improve gait speed for better community ambulation and to reduce fall risk.    Baseline 8/31: 35.1 seconds with RW 7/27: 36 seconds with RW 6/22: 36.1 seconds 5/19: 42 seconds 10/5: 31.9 seconds with RW 11/2: 29.9 seconds with RW 12/30: deferred due to spasm    Time 12    Period Weeks    Status On-going    Target Date 06/01/20      PT LONG TERM GOAL #4    Title Patient will reduce timed up and go to <11 seconds to reduce fall risk and demonstrate improved transfer/gait ability.    Baseline 8/31: 50.8 seconds with RW 7/27: 43.65 6/22: 45.4 seconds 5/19: 47 seconds 10/5: 38.5 seconds with RW 11/2: 35.72 seconds with RW 12/30: 43.5 seconds with wheels wet.    Time 12    Period Weeks    Status Partially Met    Target Date 06/01/20      PT LONG TERM GOAL #5   Title Patient will ambulate from physical therapy gym to elevator down the hall, ride elevator to first floor, and walk to valet with RW and chair follow for increased capacity for mobility and ind.    Baseline 8/31: able to open door ~50 ft with one rest break 8/10: unable to ambulate to elevator without rest break 9/30: able to ambulate to elevator with 3 rest breaks    Time 12    Period Weeks    Status Partially Met    Target Date 06/01/20      PT LONG TERM GOAL #6   Title Patient will be able to stand with RW and lift one leg onto 4" step for carryover to negotiation of steps at home.     Baseline 11/2: unable to perform     Time 12    Period Weeks    Status New    Target Date 06/01/20                 Plan - 05/13/20 1706    Clinical Impression Statement Patient's session is limited by muscle spasms requiring termination of standing interventions. Patient is improving her LE strength with decreased need for rest breaks between seated interventions. She tolerated resisted leg abduction this session. She did exhibit fatigue while standing but very motivated  to perform as many interventions as able. The patient would benefit from continued skilled PT intervention to maximize QOL, safety, and function.    Personal Factors and Comorbidities Age;Comorbidity 3+;Fitness;Past/Current Experience;Sex;Time since onset of injury/illness/exacerbation    Comorbidities HTN, SVT, neurogenic bowel, MS (1995), neuropathy, hx of wrist fx, HPV, Hypercholesteremia, adiposity, osteopenia, spasticity.     Examination-Activity Limitations Bathing;Bed Mobility;Bend;Carry;Continence;Dressing;Hygiene/Grooming;Stairs;Squat;Reach Overhead;Locomotion Level;Lift;Stand;Transfers    Examination-Participation Restrictions Church;Cleaning;Community Activity;Laundry;Volunteer;Shop;Meal Prep;Yard Work    [employer] Evolving/Moderate complexity    Rehab Potential Good    PT Frequency 2x / week    PT Duration 12 weeks    PT Treatment/Interventions ADLs/Self Care Home Management;Aquatic Therapy;Biofeedback;Cryotherapy;Electrical Stimulation;Iontophoresis 78m/ml Dexamethasone;Traction;Moist Heat;Ultrasound;DME Instruction;Gait training;Stair training;Functional mobility training;Therapeutic activities;Therapeutic exercise;Balance training;Neuromuscular re-education;Patient/family education;Orthotic Fit/Training;Wheelchair mobility training;Manual techniques;Compression bandaging;Passive range of motion;Dry needling;Energy conservation;Taping;Splinting;Vestibular    PT Next Visit Plan step negotiation, turning directions to the L and R    PT Home Exercise Plan [facility] [place] Agree with Plan of Care Patient           Patient will benefit from skilled therapeutic intervention in order to improve the following deficits and impairments:  Abnormal gait,Decreased activity tolerance,Decreased balance,Decreased coordination,Decreased endurance,Decreased mobility,Decreased range of motion,Decreased strength,Difficulty walking,Impaired perceived functional ability,Impaired sensation,Impaired tone,Impaired UE functional use,Improper body mechanics,Postural dysfunction,Cardiopulmonary status limiting activity,Impaired flexibility,Increased muscle spasms  Visit Diagnosis: Multiple sclerosis exacerbation ([facility])  Muscle weakness (generalized)  Other abnormalities of gait and mobility  Unsteadiness on feet     Problem List Patient Active Problem List   Diagnosis  Date Noted  . Hypoalbuminemia due to protein-calorie malnutrition ([place]   . Neurogenic bowel   . Neurogenic bladder   . Labile blood pressure   . Neuropathic pain   . Abscess of female pelvis   . SVT (supraventricular tachycardia) ([place]   . Radial styloid tenosynovitis 03/12/2018  . Wheelchair confinement 02/27/2018  . Localized osteoporosis with current pathological fracture with routine healing 01/19/2017  . Wrist fracture 01/16/2017  . Sprain of ankle 03/23/2016  . Closed fracture of lateral malleolus 03/16/2016  . Health care maintenance 01/24/2016  . Blood pressure elevated without history of HTN 10/25/2015  . Essential hypertension 10/25/2015  . Multiple sclerosis ([place] 10/02/2015  . Chronic left shoulder pain 07/19/2015  . Multiple sclerosis exacerbation ([facility] 07/14/2015  . MS (multiple sclerosis) ([place] 11/26/2014  . Increased body mass index 11/26/2014  . HPV test positive 11/26/2014  . Status post laparoscopic supracervical hysterectomy 11/26/2014  . Galactorrhea 11/26/2014  . Back ache 05/21/2014  . Adiposity 05/21/2014  . Disordered sleep 05/21/2014  . Muscle spasticity 05/21/2014  . Spasticity 05/21/2014  . Calculus of kidney 12/09/2013  . Renal colic 028/00/3491 . Hypercholesteremia 08/19/2013  . Hereditary and idiopathic neuropathy 08/19/2013  . Hypercholesterolemia without hypertriglyceridemia 08/19/2013  . Bladder infection, chronic 07/25/2012  . Disorder of bladder function 07/25/2012  . Incomplete bladder emptying 07/25/2012  . Microscopic hematuria 07/25/2012  . Right upper quadrant pain 07/25/2012   MJanna Arch PT, DPT   05/13/2020, 5:07 PM  [facility] [address] [address]Phone: [phone]  Fax:  [phone] Name: Lisa FallerRamseur MRN: [id number] of Birth: [birth year]

## 2020-05-14 LAB — CBC WITH DIFFERENTIAL/PLATELET
Basophils Absolute: 0 10*3/uL (ref 0.0–0.2)
Basos: 0 %
EOS (ABSOLUTE): 0.1 10*3/uL (ref 0.0–0.4)
Eos: 2 %
Hematocrit: 40.5 % (ref 34.0–46.6)
Hemoglobin: 13.5 g/dL (ref 11.1–15.9)
Immature Grans (Abs): 0 10*3/uL (ref 0.0–0.1)
Immature Granulocytes: 0 %
Lymphocytes Absolute: 0.3 10*3/uL — ABNORMAL LOW (ref 0.7–3.1)
Lymphs: 6 %
MCH: 26.7 pg (ref 26.6–33.0)
MCHC: 33.3 g/dL (ref 31.5–35.7)
MCV: 80 fL (ref 79–97)
Monocytes Absolute: 0.5 10*3/uL (ref 0.1–0.9)
Monocytes: 10 %
Neutrophils Absolute: 4.3 10*3/uL (ref 1.4–7.0)
Neutrophils: 82 %
Platelets: 235 10*3/uL (ref 150–450)
RBC: 5.05 x10E6/uL (ref 3.77–5.28)
RDW: 14.6 % (ref 11.7–15.4)
WBC: 5.3 10*3/uL (ref 3.4–10.8)

## 2020-05-14 LAB — COMPREHENSIVE METABOLIC PANEL
ALT: 31 IU/L (ref 0–32)
AST: 20 IU/L (ref 0–40)
Albumin/Globulin Ratio: 1.5 (ref 1.2–2.2)
Albumin: 4 g/dL (ref 3.8–4.9)
Alkaline Phosphatase: 133 IU/L — ABNORMAL HIGH (ref 44–121)
BUN/Creatinine Ratio: 26 — ABNORMAL HIGH (ref 9–23)
BUN: 18 mg/dL (ref 6–24)
Bilirubin Total: 0.5 mg/dL (ref 0.0–1.2)
CO2: 23 mmol/L (ref 20–29)
Calcium: 8.8 mg/dL (ref 8.7–10.2)
Chloride: 106 mmol/L (ref 96–106)
Creatinine, Ser: 0.7 mg/dL (ref 0.57–1.00)
GFR calc Af Amer: 110 mL/min/{1.73_m2} (ref >59)
GFR calc non Af Amer: 96 mL/min/{1.73_m2} (ref >59)
Globulin, Total: 2.6 g/dL (ref 1.5–4.5)
Glucose: 84 mg/dL (ref 65–99)
Potassium: 4.6 mmol/L (ref 3.5–5.2)
Sodium: 142 mmol/L (ref 134–144)
Total Protein: 6.6 g/dL (ref 6.0–8.5)

## 2020-05-14 LAB — SEDIMENTATION RATE: Sed Rate: 12 mm/hr (ref 0–40)

## 2020-05-14 LAB — TSH: TSH: 7.75 u[IU]/mL — ABNORMAL HIGH (ref 0.450–4.500)

## 2020-05-18 ENCOUNTER — Other Ambulatory Visit: Payer: Self-pay

## 2020-05-18 ENCOUNTER — Ambulatory Visit: Payer: PPO

## 2020-05-18 DIAGNOSIS — M6281 Muscle weakness (generalized): Secondary | ICD-10-CM

## 2020-05-18 DIAGNOSIS — G35 Multiple sclerosis: Secondary | ICD-10-CM

## 2020-05-18 DIAGNOSIS — R2689 Other abnormalities of gait and mobility: Secondary | ICD-10-CM

## 2020-05-18 DIAGNOSIS — R2681 Unsteadiness on feet: Secondary | ICD-10-CM

## 2020-05-18 NOTE — Therapy (Signed)
[place] MAIN [facility] SERVICES [address] [place], [place], [address] Phone: [phone]   Fax:  [phone]  Physical Therapy Treatment  Patient Details  Name: Lisa Williams MRN: [id number] Date of Birth: [birth year] Referring Provider (PT): Gurney Maxin, MD   Encounter Date: 05/18/2020   PT End of Session - 05/18/20 1310    Visit Number 30    Number of Visits 72    Date for PT Re-Evaluation 06/01/20    Authorization Type 4/10    PT Start Time 1015    PT Stop Time 1059    PT Time Calculation (min) 44 min    Equipment Utilized During Treatment Gait belt;Other (comment)   ice pack   Activity Tolerance Patient tolerated treatment well    Behavior During Therapy WFL for tasks assessed/performed           Past Medical History:  Diagnosis Date  . Abdominal pain, right upper quadrant   . Back pain   . Calculus of kidney 12/09/2013  . Chronic back pain    unspecified  . Chronic left shoulder pain 07/19/2015  . Complication of anesthesia   . Functional disorder of bladder    other  . Galactorrhea 11/26/2014   Chronic   . Hereditary and idiopathic neuropathy 08/19/2013  . HPV test positive   . Hypercholesteremia 08/19/2013  . Hypertension   . Incomplete bladder emptying   . Microscopic hematuria   . MS (multiple sclerosis) ([place])   . Muscle spasticity 05/21/2014  . Nonspecific findings on examination of urine    other  . Osteopenia   . PONV (postoperative nausea and vomiting)   . Status post laparoscopic supracervical hysterectomy 11/26/2014  . Tobacco user 11/26/2014  . Wrist fracture     Past Surgical History:  Procedure Laterality Date  . bilateral tubal ligation  1996  . BREAST CYST EXCISION Left 2002  . FRACTURE SURGERY    . KNEE SURGERY     right  . LAPAROSCOPIC SUPRACERVICAL HYSTERECTOMY  08/05/2013  . ORIF WRIST FRACTURE Left 01/17/2017   Procedure: OPEN REDUCTION INTERNAL FIXATION (ORIF) WRIST FRACTURE;   Surgeon: Lovell Sheehan, MD;  Location: [facility];  Service: Orthopedics;  Laterality: Left;  . RADIOLOGY WITH ANESTHESIA N/A 03/18/2020   Procedure: MRI WITH ANESTHESIA CERVICAL SPINE AND BRAIN  WITH AND WITHOUT CONTRAST;  Surgeon: Radiologist, Medication, MD;  Location: [place];  Service: Radiology;  Laterality: N/A;  . TUBAL LIGATION Bilateral   . VAGINAL HYSTERECTOMY  03/2006    There were no vitals filed for this visit.   Subjective Assessment - 05/18/20 1306    Subjective Patient reports increased spasming of trunk. No falls or LOB since last session. Was told she is having low thyroid levels at her doctor appointment yesterday.    Pertinent History Patient is previous patient of this therapist, was admitted to the hospital on 08/03/19 for MS exacerbation and then went to inpatient rehab on 08/08/19. Patient then received home health therapy and now is returning to outpatient PT. PMh includes HTN, SVT, neurogenic bowel, MS (1995), neuropathy, hx of wrist fx, HPV, Hypercholesteremia, adiposity, osteopenia, spasticity. She wears bilateral AFOs and uses a RW and/or wheelchair for mobility. She drives an adapted car.    Limitations Lifting;Standing;Walking;House hold activities    How long can you sit comfortably? n/a    How long can you stand comfortably? 4 minutes    How long can you walk comfortably? walked 50 ft in  inpatient rehab    Currently in Pain? Yes    Pain Score 4     Pain Location Back    Pain Orientation Right;Left;Lower    Pain Descriptors / Indicators Aching    Pain Type Neuropathic pain    Pain Onset Yesterday    Pain Frequency Intermittent                  Standing in // bars:  -Standing weight shifts 2x10 each side; UE support focus on hip shift rather than pull with arm -Modified tandem stance hold 30 seconds x 2 trials each LE placement       Patient standing interventions terminated due to spasm of back.   Seated:  STM to lumbar spinewith focus on L  parapsinals and quadratus lumborum x 11 minutes; patient reports relief of spasm by end of session   Lateral reach to pick up cone on 6" step  Bend over and reach, pick up cone and pass to opposite side (4 cones, and pt reset cones after picking up all cones) x 4 trials each side Hand clap with PT for core activation, sequencing, dual task, and UE strengthening x 45 seconds x 3 trials Hamstring stretch on PT leg 2x30 seconds; x 2 sets  RTB abduction 15x Adduction ball squeeze 15x  isometric contraction against PT resistance: -hip flexion 10x 3 second hold -hip abduction 10x 3 second hold     Ice pack donned throughout session to minimize exacerbation of MS symptoms in order to perform interventions.     Pt educated throughout session about proper posture and technique with exercises. Improved exercise technique, movement at target joints, use of target muscles after min to mod verbal, visual, tactile cues                    PT Education - 05/18/20 1309    Education provided Yes    Education Details exercise technique, pain reduction, core activity    Person(s) Educated Patient    Methods Explanation;Demonstration;Tactile cues;Verbal cues    Comprehension Verbalized understanding;Returned demonstration;Verbal cues required;Tactile cues required            PT Short Term Goals - 05/04/20 1010      PT SHORT TERM GOAL #1   Title Patient will be independent in home exercise program to improve strength/mobility for better functional independence with ADLs.    Baseline 5/19: HEP next session 6/22: HEP compliant 8/10 : HEP progressions compliant 8/31 HEP compliant 10/5: HEP compliant    Time 4    Period Weeks    Status Achieved    Target Date 01/13/20      PT SHORT TERM GOAL #2   Title Patient will perform a sit to stand with SUE support for increased ind. with transfers.    Baseline 5/19: heavy BUE support 6/22: requires one hand on walker one hand on w/c 8/10:  able to perform intermittently (some days can some days cannot) 8/31: attempt next session 10/5: BUE reliance, patient unable to complete 4 full stands. Attempt next session    Time 4    Period Weeks    Status Partially Met    Target Date 01/13/20             PT Long Term Goals - 05/06/20 0001      PT LONG TERM GOAL #1   Title Patient will increase FOTO score to equal to or greater than  53/100   to demonstrate  statistically significant improvement in mobility and quality of life.     Baseline 8/31: 48.13% 7/27:  48.1% 6/22: 46.99% 5/18: 48/100 10/5: 49/100 11/2: 48%  12/30: 46.9%    Time 12    Period Weeks    Status Partially Met    Target Date 06/01/20      PT LONG TERM GOAL #2   Title Patient (< 6 years old) will complete five times sit to stand test in < 20 seconds indicating an increased LE strength and improved balance.    Baseline 7/27: 26.8 seconds with one partial stand 6/22: 40. 86 sec; unable to reach full stand last two attempts 5/19: 42 seconds 10/5: BUE reliance, patient unable to complete 4 full stands. Attempt next session 11/2: 23.71 seconds 12/30: 30.32 with one near LOB due to spasms    Time 12    Period Weeks    Status Partially Met    Target Date 06/01/20      PT LONG TERM GOAL #3   Title Patient will increase 10 meter walk test to <20 seconds as to improve gait speed for better community ambulation and to reduce fall risk.    Baseline 8/31: 35.1 seconds with RW 7/27: 36 seconds with RW 6/22: 36.1 seconds 5/19: 42 seconds 10/5: 31.9 seconds with RW 11/2: 29.9 seconds with RW 12/30: deferred due to spasm    Time 12    Period Weeks    Status On-going    Target Date 06/01/20      PT LONG TERM GOAL #4   Title Patient will reduce timed up and go to <11 seconds to reduce fall risk and demonstrate improved transfer/gait ability.    Baseline 8/31: 50.8 seconds with RW 7/27: 43.65 6/22: 45.4 seconds 5/19: 47 seconds 10/5: 38.5 seconds with RW 11/2: 35.72 seconds  with RW 12/30: 43.5 seconds with wheels wet.    Time 12    Period Weeks    Status Partially Met    Target Date 06/01/20      PT LONG TERM GOAL #5   Title Patient will ambulate from physical therapy gym to elevator down the hall, ride elevator to first floor, and walk to valet with RW and chair follow for increased capacity for mobility and ind.    Baseline 8/31: able to open door ~50 ft with one rest break 8/10: unable to ambulate to elevator without rest break 9/30: able to ambulate to elevator with 3 rest breaks    Time 12    Period Weeks    Status Partially Met    Target Date 06/01/20      PT LONG TERM GOAL #6   Title Patient will be able to stand with RW and lift one leg onto 4" step for carryover to negotiation of steps at home.     Baseline 11/2: unable to perform     Time 12    Period Weeks    Status New    Target Date 06/01/20                 Plan - 05/18/20 1316    Clinical Impression Statement Patient's session today is limited by lateral trunk spasms that are reduced by end of session.Manual to low back reduced spasms but did not eliminate them completely Core activation continues to progress however and patient remains highly motivated throughout session. The patient would benefit from continued skilled PT intervention to maximize QOL, safety, and function.    Personal Factors and  Comorbidities Age;Comorbidity 3+;Fitness;Past/Current Experience;Sex;Time since onset of injury/illness/exacerbation    Comorbidities HTN, SVT, neurogenic bowel, MS (1995), neuropathy, hx of wrist fx, HPV, Hypercholesteremia, adiposity, osteopenia, spasticity.    Examination-Activity Limitations Bathing;Bed Mobility;Bend;Carry;Continence;Dressing;Hygiene/Grooming;Stairs;Squat;Reach Overhead;Locomotion Level;Lift;Stand;Transfers    Examination-Participation Restrictions Church;Cleaning;Community Activity;Laundry;Volunteer;Shop;Meal Prep;Yard Work    [employer]  Evolving/Moderate complexity    Rehab Potential Good    PT Frequency 2x / week    PT Duration 12 weeks    PT Treatment/Interventions ADLs/Self Care Home Management;Aquatic Therapy;Biofeedback;Cryotherapy;Electrical Stimulation;Iontophoresis 83m/ml Dexamethasone;Traction;Moist Heat;Ultrasound;DME Instruction;Gait training;Stair training;Functional mobility training;Therapeutic activities;Therapeutic exercise;Balance training;Neuromuscular re-education;Patient/family education;Orthotic Fit/Training;Wheelchair mobility training;Manual techniques;Compression bandaging;Passive range of motion;Dry needling;Energy conservation;Taping;Splinting;Vestibular    PT Next Visit Plan step negotiation, turning directions to the L and R    PT Home Exercise Plan maintain current HEP from [facility] Agree with Plan of Care Patient           Patient will benefit from skilled therapeutic intervention in order to improve the following deficits and impairments:  Abnormal gait,Decreased activity tolerance,Decreased balance,Decreased coordination,Decreased endurance,Decreased mobility,Decreased range of motion,Decreased strength,Difficulty walking,Impaired perceived functional ability,Impaired sensation,Impaired tone,Impaired UE functional use,Improper body mechanics,Postural dysfunction,Cardiopulmonary status limiting activity,Impaired flexibility,Increased muscle spasms  Visit Diagnosis: Multiple sclerosis exacerbation ([facility])  Muscle weakness (generalized)  Other abnormalities of gait and mobility  Unsteadiness on feet     Problem List Patient Active Problem List   Diagnosis Date Noted  . Hypoalbuminemia due to protein-calorie malnutrition ([place]   . Neurogenic bowel   . Neurogenic bladder   . Labile blood pressure   . Neuropathic pain   . Abscess of female pelvis   . SVT (supraventricular tachycardia) ([place]   . Radial styloid tenosynovitis 03/12/2018  . Wheelchair confinement 02/27/2018  .  Localized osteoporosis with current pathological fracture with routine healing 01/19/2017  . Wrist fracture 01/16/2017  . Sprain of ankle 03/23/2016  . Closed fracture of lateral malleolus 03/16/2016  . Health care maintenance 01/24/2016  . Blood pressure elevated without history of HTN 10/25/2015  . Essential hypertension 10/25/2015  . Multiple sclerosis ([place] 10/02/2015  . Chronic left shoulder pain 07/19/2015  . [place] exacerbation ([place] 07/14/2015  . MS (multiple sclerosis) ([place] 11/26/2014  . Increased body mass index 11/26/2014  . HPV test positive 11/26/2014  . Status post laparoscopic supracervical hysterectomy 11/26/2014  . Galactorrhea 11/26/2014  . Back ache 05/21/2014  . Adiposity 05/21/2014  . Disordered sleep 05/21/2014  . Muscle spasticity 05/21/2014  . Spasticity 05/21/2014  . Calculus of kidney 12/09/2013  . Renal colic 016/02/9603 . Hypercholesteremia 08/19/2013  . Hereditary and idiopathic neuropathy 08/19/2013  . Hypercholesterolemia without hypertriglyceridemia 08/19/2013  . Bladder infection, chronic 07/25/2012  . Disorder of bladder function 07/25/2012  . Incomplete bladder emptying 07/25/2012  . Microscopic hematuria 07/25/2012  . Right upper quadrant pain 07/25/2012   MJanna Arch PT, DPT   05/18/2020, 1:17 PM  [place] [address] [address]Phone: [phone]  Fax:  [phone] Name: Lisa Williams MRN: [id number] of Birth: 603/20/1962

## 2020-05-20 ENCOUNTER — Ambulatory Visit: Payer: PPO

## 2020-05-20 ENCOUNTER — Other Ambulatory Visit: Payer: Self-pay

## 2020-05-20 DIAGNOSIS — R2681 Unsteadiness on feet: Secondary | ICD-10-CM

## 2020-05-20 DIAGNOSIS — G35 Multiple sclerosis: Secondary | ICD-10-CM | POA: Diagnosis not present

## 2020-05-20 DIAGNOSIS — R2689 Other abnormalities of gait and mobility: Secondary | ICD-10-CM

## 2020-05-20 DIAGNOSIS — M6281 Muscle weakness (generalized): Secondary | ICD-10-CM

## 2020-05-20 NOTE — Therapy (Signed)
[facility] [address] [place], [place], [address] Phone: [phone]   Fax:  [phone]  Physical Therapy Treatment  Patient Details  Name: Lisa Williams MRN: [id number] Date of Birth: 12/13/1960 Referring Provider (PT): Zachary Potter, MD   Encounter Date: 05/20/2020   PT End of Session - 05/20/20 1014    Visit Number 65    Number of Visits 72    Date for PT Re-Evaluation 06/01/20    Authorization Type 5/10    PT Start Time 1016    PT Stop Time 1100    PT Time Calculation (min) 44 min    Equipment Utilized During Treatment Gait belt;Other (comment)   ice pack   Activity Tolerance Patient tolerated treatment well    Behavior During Therapy WFL for tasks assessed/performed           Past Medical History:  Diagnosis Date  . Abdominal pain, right upper quadrant   . Back pain   . Calculus of kidney 12/09/2013  . Chronic back pain    unspecified  . Chronic left shoulder pain 07/19/2015  . Complication of anesthesia   . Functional disorder of bladder    other  . Galactorrhea 11/26/2014   Chronic   . Hereditary and idiopathic neuropathy 08/19/2013  . HPV test positive   . Hypercholesteremia 08/19/2013  . Hypertension   . Incomplete bladder emptying   . Microscopic hematuria   . MS (multiple sclerosis) (HCC)   . Muscle spasticity 05/21/2014  . Nonspecific findings on examination of urine    other  . Osteopenia   . PONV (postoperative nausea and vomiting)   . Status post laparoscopic supracervical hysterectomy 11/26/2014  . Tobacco user 11/26/2014  . Wrist fracture     Past Surgical History:  Procedure Laterality Date  . bilateral tubal ligation  1996  . BREAST CYST EXCISION Left 2002  . FRACTURE SURGERY    . KNEE SURGERY     right  . LAPAROSCOPIC SUPRACERVICAL HYSTERECTOMY  08/05/2013  . ORIF WRIST FRACTURE Left 01/17/2017   Procedure: OPEN REDUCTION INTERNAL FIXATION (ORIF) WRIST FRACTURE;   Surgeon: Bowers, James R, MD;  Location: [facility] ORS;  Service: Orthopedics;  Laterality: Left;  . RADIOLOGY WITH ANESTHESIA N/A 03/18/2020   Procedure: MRI WITH ANESTHESIA CERVICAL SPINE AND BRAIN  WITH AND WITHOUT CONTRAST;  Surgeon: Radiologist, Medication, MD;  Location: [facility] OR;  Service: Radiology;  Laterality: N/A;  . TUBAL LIGATION Bilateral   . VAGINAL HYSTERECTOMY  03/2006    There were no vitals filed for this visit.   Subjective Assessment - 05/20/20 1013    Subjective Patient reports no falls or LOB since last session. Has been compliant with HEP.    Pertinent History Patient is previous patient of this therapist, was admitted to the hospital on 08/03/19 for MS exacerbation and then went to inpatient rehab on 08/08/19. Patient then received home health therapy and now is returning to outpatient PT. PMh includes HTN, SVT, neurogenic bowel, MS (1995), neuropathy, hx of wrist fx, HPV, Hypercholesteremia, adiposity, osteopenia, spasticity. She wears bilateral AFOs and uses a RW and/or wheelchair for mobility. She drives an adapted car.    Limitations Lifting;Standing;Walking;House hold activities    How long can you sit comfortably? n/a    How long can you stand comfortably? 4 minutes    How long can you walk comfortably? walked 50 ft in inpatient rehab    Currently in Pain? No/denies                  Gait: Patient ambulates 181ft, 2 seated rest breaks CGA and wheelchair follow for safety. Patient able to navigate opening doors, turns, and thresholds independently but with inc time to perform. Patient demonstrates inc reliance on BUE by end of gait trial.   Seated:  RTB row 15x RTB abduction 15x  Forward/backward trunk lean for modified seated sit up x 12; very challenging   hamstring lengthening 30 seconds x2 trials each LE  isometric contraction against PT resistance: -hip flexion 10x 3 second hold -hip abduction 10x 3 second hold      Ice pack donned throughout session to  minimize exacerbation of MS symptoms in order to perform interventions.    Pt educated throughout session about proper posture and technique with exercises. Improved exercise technique, movement at target joints, use of target muscles after min to mod verbal, visual, tactile cues.                           PT Education - 05/20/20 1014    Education provided Yes    Education Details exercise technique, body mechanics    Person(s) Educated Patient    Methods Explanation;Demonstration;Tactile cues;Verbal cues    Comprehension Verbalized understanding;Returned demonstration;Verbal cues required;Tactile cues required            PT Short Term Goals - 05/04/20 1010      PT SHORT TERM GOAL #1   Title Patient will be independent in home exercise program to improve strength/mobility for better functional independence with ADLs.    Baseline 5/19: HEP next session 6/22: HEP compliant 8/10 : HEP progressions compliant 8/31 HEP compliant 10/5: HEP compliant    Time 4    Period Weeks    Status Achieved    Target Date 01/13/20      PT SHORT TERM GOAL #2   Title Patient will perform a sit to stand with SUE support for increased ind. with transfers.    Baseline 5/19: heavy BUE support 6/22: requires one hand on walker one hand on w/c 8/10: able to perform intermittently (some days can some days cannot) 8/31: attempt next session 10/5: BUE reliance, patient unable to complete 4 full stands. Attempt next session    Time 4    Period Weeks    Status Partially Met    Target Date 01/13/20             PT Long Term Goals - 05/06/20 0001      PT LONG TERM GOAL #1   Title Patient will increase FOTO score to equal to or greater than  53/100   to demonstrate statistically significant improvement in mobility and quality of life.     Baseline 8/31: 48.13% 7/27:  48.1% 6/22: 46.99% 5/18: 48/100 10/5: 49/100 11/2: 48%  12/30: 46.9%    Time 12    Period Weeks    Status Partially Met     Target Date 06/01/20      PT LONG TERM GOAL #2   Title Patient (< 60 years old) will complete five times sit to stand test in < 20 seconds indicating an increased LE strength and improved balance.    Baseline 7/27: 26.8 seconds with one partial stand 6/22: 40. 86 sec; unable to reach full stand last two attempts 5/19: 42 seconds 10/5: BUE reliance, patient unable to complete 4 full stands. Attempt next session 11/2: 23.71 seconds 12/30: 30.32 with one near LOB due to spasms    Time 12      Period Weeks    Status Partially Met    Target Date 06/01/20      PT LONG TERM GOAL #3   Title Patient will increase 10 meter walk test to <20 seconds as to improve gait speed for better community ambulation and to reduce fall risk.    Baseline 8/31: 35.1 seconds with RW 7/27: 36 seconds with RW 6/22: 36.1 seconds 5/19: 42 seconds 10/5: 31.9 seconds with RW 11/2: 29.9 seconds with RW 12/30: deferred due to spasm    Time 12    Period Weeks    Status On-going    Target Date 06/01/20      PT LONG TERM GOAL #4   Title Patient will reduce timed up and go to <11 seconds to reduce fall risk and demonstrate improved transfer/gait ability.    Baseline 8/31: 50.8 seconds with RW 7/27: 43.65 6/22: 45.4 seconds 5/19: 47 seconds 10/5: 38.5 seconds with RW 11/2: 35.72 seconds with RW 12/30: 43.5 seconds with wheels wet.    Time 12    Period Weeks    Status Partially Met    Target Date 06/01/20      PT LONG TERM GOAL #5   Title Patient will ambulate from physical therapy gym to elevator down the hall, ride elevator to first floor, and walk to valet with RW and chair follow for increased capacity for mobility and ind.    Baseline 8/31: able to open door ~50 ft with one rest break 8/10: unable to ambulate to elevator without rest break 9/30: able to ambulate to elevator with 3 rest breaks    Time 12    Period Weeks    Status Partially Met    Target Date 06/01/20      PT LONG TERM GOAL #6   Title Patient will be  able to stand with RW and lift one leg onto 4" step for carryover to negotiation of steps at home.     Baseline 11/2: unable to perform     Time 12    Period Weeks    Status New    Target Date 06/01/20                 Plan - 05/20/20 1240    Clinical Impression Statement Patient ambulated furthest distance to date this session with only two seated rest breaks needed. Patient very fatigued by end of ambulation limiting rest of sessions task performance. Patient continues to progress with functional mobility with no episodes of spasms limiting her today. The patient would benefit from continued skilled PT intervention to maximize QOL, safety, and function.    Personal Factors and Comorbidities Age;Comorbidity 3+;Fitness;Past/Current Experience;Sex;Time since onset of injury/illness/exacerbation    Comorbidities HTN, SVT, neurogenic bowel, MS (1995), neuropathy, hx of wrist fx, HPV, Hypercholesteremia, adiposity, osteopenia, spasticity.    Examination-Activity Limitations Bathing;Bed Mobility;Bend;Carry;Continence;Dressing;Hygiene/Grooming;Stairs;Squat;Reach Overhead;Locomotion Level;Lift;Stand;Transfers    Examination-Participation Restrictions Church;Cleaning;Community Activity;Laundry;Volunteer;Shop;Meal Prep;Yard Work    Stability/Clinical Decision Making Evolving/Moderate complexity    Rehab Potential Good    PT Frequency 2x / week    PT Duration 12 weeks    PT Treatment/Interventions ADLs/Self Care Home Management;Aquatic Therapy;Biofeedback;Cryotherapy;Electrical Stimulation;Iontophoresis 4mg/ml Dexamethasone;Traction;Moist Heat;Ultrasound;DME Instruction;Gait training;Stair training;Functional mobility training;Therapeutic activities;Therapeutic exercise;Balance training;Neuromuscular re-education;Patient/family education;Orthotic Fit/Training;Wheelchair mobility training;Manual techniques;Compression bandaging;Passive range of motion;Dry needling;Energy  conservation;Taping;Splinting;Vestibular    PT Next Visit Plan step negotiation, turning directions to the L and R    PT Home Exercise Plan maintain current HEP from [facility]    Consulted and Agree with Plan   of Care Patient           Patient will benefit from skilled therapeutic intervention in order to improve the following deficits and impairments:  Abnormal gait,Decreased activity tolerance,Decreased balance,Decreased coordination,Decreased endurance,Decreased mobility,Decreased range of motion,Decreased strength,Difficulty walking,Impaired perceived functional ability,Impaired sensation,Impaired tone,Impaired UE functional use,Improper body mechanics,Postural dysfunction,Cardiopulmonary status limiting activity,Impaired flexibility,Increased muscle spasms  Visit Diagnosis: Multiple sclerosis exacerbation (HCC)  Muscle weakness (generalized)  Other abnormalities of gait and mobility  Unsteadiness on feet     Problem List Patient Active Problem List   Diagnosis Date Noted  . Hypoalbuminemia due to protein-calorie malnutrition (HCC)   . Neurogenic bowel   . Neurogenic bladder   . Labile blood pressure   . Neuropathic pain   . Abscess of female pelvis   . SVT (supraventricular tachycardia) (HCC)   . Radial styloid tenosynovitis 03/12/2018  . Wheelchair confinement 02/27/2018  . Localized osteoporosis with current pathological fracture with routine healing 01/19/2017  . Wrist fracture 01/16/2017  . Sprain of ankle 03/23/2016  . Closed fracture of lateral malleolus 03/16/2016  . Health care maintenance 01/24/2016  . Blood pressure elevated without history of HTN 10/25/2015  . Essential hypertension 10/25/2015  . Multiple sclerosis (HCC) 10/02/2015  . Chronic left shoulder pain 07/19/2015  . Multiple sclerosis exacerbation (HCC) 07/14/2015  . MS (multiple sclerosis) (HCC) 11/26/2014  . Increased body mass index 11/26/2014  . HPV test positive 11/26/2014  . Status post  laparoscopic supracervical hysterectomy 11/26/2014  . Galactorrhea 11/26/2014  . Back ache 05/21/2014  . Adiposity 05/21/2014  . Disordered sleep 05/21/2014  . Muscle spasticity 05/21/2014  . Spasticity 05/21/2014  . Calculus of kidney 12/09/2013  . Renal colic 12/09/2013  . Hypercholesteremia 08/19/2013  . Hereditary and idiopathic neuropathy 08/19/2013  . Hypercholesterolemia without hypertriglyceridemia 08/19/2013  . Bladder infection, chronic 07/25/2012  . Disorder of bladder function 07/25/2012  . Incomplete bladder emptying 07/25/2012  . Microscopic hematuria 07/25/2012  . Right upper quadrant pain 07/25/2012    , PT, DPT   05/20/2020, 12:42 PM  [facility] [address] [place], [place], [address] Phone: [phone]   Fax:  [phone]  Name: Lisa Williams MRN: [id number] Date of Birth: 08/18/1960   

## 2020-05-21 ENCOUNTER — Other Ambulatory Visit: Payer: Self-pay | Admitting: Neurology

## 2020-05-21 MED ORDER — TIZANIDINE HCL 2 MG PO TABS: 2.0000 mg | ORAL_TABLET | Freq: Three times a day (TID) | ORAL | 1 refills | Status: DC

## 2020-05-21 NOTE — Progress Notes (Signed)
Tizanidine 2

## 2020-05-25 ENCOUNTER — Ambulatory Visit: Payer: PPO

## 2020-05-27 ENCOUNTER — Ambulatory Visit: Payer: PPO

## 2020-05-27 DIAGNOSIS — R2689 Other abnormalities of gait and mobility: Secondary | ICD-10-CM

## 2020-05-27 DIAGNOSIS — R2681 Unsteadiness on feet: Secondary | ICD-10-CM

## 2020-05-27 DIAGNOSIS — M6281 Muscle weakness (generalized): Secondary | ICD-10-CM

## 2020-05-27 DIAGNOSIS — G35 Multiple sclerosis: Secondary | ICD-10-CM

## 2020-05-27 NOTE — Therapy (Signed)
[place] MAIN [facility] SERVICES [address] [place], [place], [address] Phone: [phone]   Fax:  [phone]  Physical Therapy Treatment  Patient Details  Name: Lisa Williams MRN: [id number] Date of Birth: 1961-04-25 Referring Provider (PT): Gurney Maxin, MD   Encounter Date: 05/27/2020   PT End of Session - 05/27/20 1234    Visit Number [id number]    Number of Visits 72    Date for PT Re-Evaluation 06/01/20    Authorization Type 6/10    PT Start Time 1018    PT Stop Time 1059    PT Time Calculation (min) 41 min    Equipment Utilized During Treatment Gait belt;Other (comment)   ice pack   Activity Tolerance Patient tolerated treatment well    Behavior During Therapy WFL for tasks assessed/performed           Past Medical History:  Diagnosis Date  . Abdominal pain, right upper quadrant   . Back pain   . Calculus of kidney 12/09/2013  . Chronic back pain    unspecified  . Chronic left shoulder pain 07/19/2015  . Complication of anesthesia   . Functional disorder of bladder    other  . Galactorrhea 11/26/2014   Chronic   . Hereditary and idiopathic neuropathy 08/19/2013  . HPV test positive   . Hypercholesteremia 08/19/2013  . Hypertension   . Incomplete bladder emptying   . Microscopic hematuria   . MS (multiple sclerosis) ([place])   . Muscle spasticity 05/21/2014  . Nonspecific findings on examination of urine    other  . Osteopenia   . PONV (postoperative nausea and vomiting)   . Status post laparoscopic supracervical hysterectomy 11/26/2014  . Tobacco user 11/26/2014  . Wrist fracture     Past Surgical History:  Procedure Laterality Date  . bilateral tubal ligation  1996  . BREAST CYST EXCISION Left 2002  . FRACTURE SURGERY    . KNEE SURGERY     right  . LAPAROSCOPIC SUPRACERVICAL HYSTERECTOMY  08/05/2013  . ORIF WRIST FRACTURE Left 01/17/2017   Procedure: OPEN REDUCTION INTERNAL FIXATION (ORIF) WRIST FRACTURE;   Surgeon: Lovell Sheehan, MD;  Location: [facility];  Service: Orthopedics;  Laterality: Left;  . RADIOLOGY WITH ANESTHESIA N/A 03/18/2020   Procedure: MRI WITH ANESTHESIA CERVICAL SPINE AND BRAIN  WITH AND WITHOUT CONTRAST;  Surgeon: Radiologist, Medication, MD;  Location: [facility];  Service: Radiology;  Laterality: N/A;  . TUBAL LIGATION Bilateral   . VAGINAL HYSTERECTOMY  03/2006    There were no vitals filed for this visit.   Subjective Assessment - 05/27/20 1231    Subjective Patient reports her doctor has given her medicine for her spasms that has helped tremendously.    Pertinent History Patient is previous patient of this therapist, was admitted to the hospital on 08/03/19 for MS exacerbation and then went to inpatient rehab on 08/08/19. Patient then received home health therapy and now is returning to outpatient PT. PMh includes HTN, SVT, neurogenic bowel, MS (1995), neuropathy, hx of wrist fx, HPV, Hypercholesteremia, adiposity, osteopenia, spasticity. She wears bilateral AFOs and uses a RW and/or wheelchair for mobility. She drives an adapted car.    Limitations Lifting;Standing;Walking;House hold activities    How long can you sit comfortably? n/a    How long can you stand comfortably? 4 minutes    How long can you walk comfortably? walked 50 ft in inpatient rehab    Currently in Pain? No/denies  Gait: Patient ambulates6f, 2 seated rest breaks CGA and wheelchair follow for safety. Patient able to navigate opening doors, turns, and thresholds independently but with inc time to perform.Patient demonstrates inc reliance on BUE by end of gait trial.  Seated: Lateral reach to pick up cone ongroundBend over and reach, pick upcone and pass to opposite side (4 cones, and pt reset cones after picking up all cones) x 6 trials each side Hand clap with PT for core activation, sequencing, dual task, and UE strengthening x 45 seconds x 3 trials hamstring lengthening 30  seconds x2 trials each LE  isometric contraction against PT resistance: -hip flexion 10x 3 second hold -hip abduction 10x 3 second hold    Ice pack donned throughout session to minimize exacerbation of MS symptoms in order to perform interventions.  Pt educated throughout session about proper posture and technique with exercises. Improved exercise technique, movement at target joints, use of target muscles after min to mod verbal, visual, tactile cues.                            PT Education - 05/27/20 1233    Education provided Yes    Education Details exercise technique, body mechanics    Person(s) Educated Patient    Methods Explanation;Demonstration;Tactile cues;Verbal cues    Comprehension Verbalized understanding;Returned demonstration;Verbal cues required;Tactile cues required            PT Short Term Goals - 05/04/20 1010      PT SHORT TERM GOAL #1   Title Patient will be independent in home exercise program to improve strength/mobility for better functional independence with ADLs.    Baseline 5/19: HEP next session 6/22: HEP compliant 8/10 : HEP progressions compliant 8/31 HEP compliant 10/5: HEP compliant    Time 4    Period Weeks    Status Achieved    Target Date 01/13/20      PT SHORT TERM GOAL #2   Title Patient will perform a sit to stand with SUE support for increased ind. with transfers.    Baseline 5/19: heavy BUE support 6/22: requires one hand on walker one hand on w/c 8/10: able to perform intermittently (some days can some days cannot) 8/31: attempt next session 10/5: BUE reliance, patient unable to complete 4 full stands. Attempt next session    Time 4    Period Weeks    Status Partially Met    Target Date 01/13/20             PT Long Term Goals - 05/06/20 0001      PT LONG TERM GOAL #1   Title Patient will increase FOTO score to equal to or greater than  53/100   to demonstrate statistically significant improvement  in mobility and quality of life.     Baseline 8/31: 48.13% 7/27:  48.1% 6/22: 46.99% 5/18: 48/100 10/5: 49/100 11/2: 48%  12/30: 46.9%    Time 12    Period Weeks    Status Partially Met    Target Date 06/01/20      PT LONG TERM GOAL #2   Title Patient (< 629years old) will complete five times sit to stand test in < 20 seconds indicating an increased LE strength and improved balance.    Baseline 7/27: 26.8 seconds with one partial stand 6/22: 40. 86 sec; unable to reach full stand last two attempts 5/19: 42 seconds 10/5: BUE reliance, patient unable to  complete 4 full stands. Attempt next session 11/2: 23.71 seconds 12/30: 30.32 with one near LOB due to spasms    Time 12    Period Weeks    Status Partially Met    Target Date 06/01/20      PT LONG TERM GOAL #3   Title Patient will increase 10 meter walk test to <20 seconds as to improve gait speed for better community ambulation and to reduce fall risk.    Baseline 8/31: 35.1 seconds with RW 7/27: 36 seconds with RW 6/22: 36.1 seconds 5/19: 42 seconds 10/5: 31.9 seconds with RW 11/2: 29.9 seconds with RW 12/30: deferred due to spasm    Time 12    Period Weeks    Status On-going    Target Date 06/01/20      PT LONG TERM GOAL #4   Title Patient will reduce timed up and go to <11 seconds to reduce fall risk and demonstrate improved transfer/gait ability.    Baseline 8/31: 50.8 seconds with RW 7/27: 43.65 6/22: 45.4 seconds 5/19: 47 seconds 10/5: 38.5 seconds with RW 11/2: 35.72 seconds with RW 12/30: 43.5 seconds with wheels wet.    Time 12    Period Weeks    Status Partially Met    Target Date 06/01/20      PT LONG TERM GOAL #5   Title Patient will ambulate from physical therapy gym to elevator down the hall, ride elevator to first floor, and walk to valet with RW and chair follow for increased capacity for mobility and ind.    Baseline 8/31: able to open door ~50 ft with one rest break 8/10: unable to ambulate to elevator without rest  break 9/30: able to ambulate to elevator with 3 rest breaks    Time 12    Period Weeks    Status Partially Met    Target Date 06/01/20      PT LONG TERM GOAL #6   Title Patient will be able to stand with RW and lift one leg onto 4" step for carryover to negotiation of steps at home.     Baseline 11/2: unable to perform     Time 12    Period Weeks    Status New    Target Date 06/01/20                 Plan - 05/27/20 1236    Clinical Impression Statement Patient's duration of ambulation is limited this session due to fatigue however her gait mechanics continue to improve with better weight shift and ability to distinguish between hip and knee flexion for a smoother more complete step bilaterally. Core activation is progressing with increasing complexity and difficulty leveled activities reaching. The patient would benefit from continued skilled PT intervention to maximize QOL, safety, and function.    Personal Factors and Comorbidities Age;Comorbidity 3+;Fitness;Past/Current Experience;Sex;Time since onset of injury/illness/exacerbation    Comorbidities HTN, SVT, neurogenic bowel, MS (1995), neuropathy, hx of wrist fx, HPV, Hypercholesteremia, adiposity, osteopenia, spasticity.    Examination-Activity Limitations Bathing;Bed Mobility;Bend;Carry;Continence;Dressing;Hygiene/Grooming;Stairs;Squat;Reach Overhead;Locomotion Level;Lift;Stand;Transfers    Examination-Participation Restrictions Church;Cleaning;Community Activity;Laundry;Volunteer;Shop;Meal Prep;Yard Work    [employer] Evolving/Moderate complexity    Rehab Potential Good    PT Frequency 2x / week    PT Duration 12 weeks    PT Treatment/Interventions ADLs/Self Care Home Management;Aquatic Therapy;Biofeedback;Cryotherapy;Electrical Stimulation;Iontophoresis 76m/ml Dexamethasone;Traction;Moist Heat;Ultrasound;DME Instruction;Gait training;Stair training;Functional mobility training;Therapeutic  activities;Therapeutic exercise;Balance training;Neuromuscular re-education;Patient/family education;Orthotic Fit/Training;Wheelchair mobility training;Manual techniques;Compression bandaging;Passive range of motion;Dry needling;Energy conservation;Taping;Splinting;Vestibular  PT Next Visit Plan step negotiation, turning directions to the L and R    PT Home Exercise Plan maintain current HEP from [place] and Agree with Plan of Care Patient           Patient will benefit from skilled therapeutic intervention in order to improve the following deficits and impairments:  Abnormal gait,Decreased activity tolerance,Decreased balance,Decreased coordination,Decreased endurance,Decreased mobility,Decreased range of motion,Decreased strength,Difficulty walking,Impaired perceived functional ability,Impaired sensation,Impaired tone,Impaired UE functional use,Improper body mechanics,Postural dysfunction,Cardiopulmonary status limiting activity,Impaired flexibility,Increased muscle spasms  Visit Diagnosis: Multiple sclerosis exacerbation ([facility])  Muscle weakness (generalized)  Other abnormalities of gait and mobility  Unsteadiness on feet     Problem List Patient Active Problem List   Diagnosis Date Noted  . Hypoalbuminemia due to protein-calorie malnutrition ([place])   . Neurogenic bowel   . Neurogenic bladder   . Labile blood pressure   . Neuropathic pain   . Abscess of female pelvis   . SVT (supraventricular tachycardia) ([place])   . Radial styloid tenosynovitis 03/12/2018  . Wheelchair confinement 02/27/2018  . Localized osteoporosis with current pathological fracture with routine healing 01/19/2017  . Wrist fracture 01/16/2017  . Sprain of ankle 03/23/2016  . Closed fracture of lateral malleolus 03/16/2016  . Health care maintenance 01/24/2016  . Blood pressure elevated without history of HTN 10/25/2015  . Essential hypertension 10/25/2015  . Multiple sclerosis ([place]) 10/02/2015   . Chronic left shoulder pain 07/19/2015  . Multiple sclerosis exacerbation ([place]) 07/14/2015  . MS (multiple sclerosis) ([place]) 11/26/2014  . Increased body mass index 11/26/2014  . HPV test positive 11/26/2014  . Status post laparoscopic supracervical hysterectomy 11/26/2014  . Galactorrhea 11/26/2014  . Back ache 05/21/2014  . Adiposity 05/21/2014  . Disordered sleep 05/21/2014  . Muscle spasticity 05/21/2014  . Spasticity 05/21/2014  . Calculus of kidney 12/09/2013  . Renal colic 39/07/90  . Hypercholesteremia 08/19/2013  . Hereditary and idiopathic neuropathy 08/19/2013  . Hypercholesterolemia without hypertriglyceridemia 08/19/2013  . Bladder infection, chronic 07/25/2012  . Disorder of bladder function 07/25/2012  . Incomplete bladder emptying 07/25/2012  . Microscopic hematuria 07/25/2012  . Right upper quadrant pain 07/25/2012   Janna Arch, PT, DPT   05/27/2020, 12:38 PM  [place] MAIN [facility] SERVICES [address] [place], [place], [address] Phone: [phone]   Fax:  [phone]  Name: Jonay Hitchcock Ohlson MRN: [id number] Date of Birth: 08-May-1961

## 2020-06-01 ENCOUNTER — Other Ambulatory Visit: Payer: Self-pay

## 2020-06-01 ENCOUNTER — Ambulatory Visit: Payer: PPO

## 2020-06-01 DIAGNOSIS — M6281 Muscle weakness (generalized): Secondary | ICD-10-CM

## 2020-06-01 DIAGNOSIS — R2681 Unsteadiness on feet: Secondary | ICD-10-CM

## 2020-06-01 DIAGNOSIS — G35 Multiple sclerosis: Secondary | ICD-10-CM | POA: Diagnosis not present

## 2020-06-01 DIAGNOSIS — R2689 Other abnormalities of gait and mobility: Secondary | ICD-10-CM

## 2020-06-01 NOTE — Therapy (Signed)
[facility] MAIN [facility] SERVICES [address] [place], [place], [address] Phone: [phone]   Fax:  [phone]  Physical Therapy Treatment/RECERT  Patient Details  Name: Lisa Williams MRN: [id number] Date of Birth: [birth year] Referring Provider (PT): Gurney Maxin, MD   Encounter Date: 06/01/2020   PT End of Session - 06/01/20 1609    Visit Number 75    Number of Visits 91    Date for PT Re-Evaluation 08/24/20    Authorization Type 7/10    PT Start Time 1016    PT Stop Time 1100    PT Time Calculation (min) 44 min    Equipment Utilized During Treatment Gait belt;Other (comment)   ice pack   Activity Tolerance Patient tolerated treatment well    Behavior During Therapy WFL for tasks assessed/performed           Past Medical History:  Diagnosis Date  . Abdominal pain, right upper quadrant   . Back pain   . Calculus of kidney 12/09/2013  . Chronic back pain    unspecified  . Chronic left shoulder pain 07/19/2015  . Complication of anesthesia   . Functional disorder of bladder    other  . Galactorrhea 11/26/2014   Chronic   . Hereditary and idiopathic neuropathy 08/19/2013  . HPV test positive   . Hypercholesteremia 08/19/2013  . Hypertension   . Incomplete bladder emptying   . Microscopic hematuria   . MS (multiple sclerosis) ([place])   . Muscle spasticity 05/21/2014  . Nonspecific findings on examination of urine    other  . Osteopenia   . PONV (postoperative nausea and vomiting)   . Status post laparoscopic supracervical hysterectomy 11/26/2014  . Tobacco user 11/26/2014  . Wrist fracture     Past Surgical History:  Procedure Laterality Date  . bilateral tubal ligation  1996  . BREAST CYST EXCISION Left 2002  . FRACTURE SURGERY    . KNEE SURGERY     right  . LAPAROSCOPIC SUPRACERVICAL HYSTERECTOMY  08/05/2013  . ORIF WRIST FRACTURE Left 01/17/2017   Procedure: OPEN REDUCTION INTERNAL FIXATION (ORIF) WRIST  FRACTURE;  Surgeon: Lovell Sheehan, MD;  Location: [facility];  Service: Orthopedics;  Laterality: Left;  . RADIOLOGY WITH ANESTHESIA N/A 03/18/2020   Procedure: MRI WITH ANESTHESIA CERVICAL SPINE AND BRAIN  WITH AND WITHOUT CONTRAST;  Surgeon: Radiologist, Medication, MD;  Location: [facility];  Service: Radiology;  Laterality: N/A;  . TUBAL LIGATION Bilateral   . VAGINAL HYSTERECTOMY  03/2006    There were no vitals filed for this visit.   Subjective Assessment - 06/01/20 1259    Subjective Patient reports her spasms are improving however does feel a little warm today. No falls or LOB since last session. Has been compliant with HEP.    Pertinent History Patient is previous patient of this therapist, was admitted to the hospital on 08/03/19 for MS exacerbation and then went to inpatient rehab on 08/08/19. Patient then received home health therapy and now is returning to outpatient PT. PMh includes HTN, SVT, neurogenic bowel, MS (1995), neuropathy, hx of wrist fx, HPV, Hypercholesteremia, adiposity, osteopenia, spasticity. She wears bilateral AFOs and uses a RW and/or wheelchair for mobility. She drives an adapted car.    Limitations Lifting;Standing;Walking;House hold activities    How long can you sit comfortably? n/a    How long can you stand comfortably? 4 minutes    How long can you walk comfortably? walked 50 ft in inpatient  rehab    Currently in Pain? No/denies                 RECERT 5x STS: 70.48 seconds one hand on RW one hand on chair.  FOTO: 46.99% 10 MWT: deferred due to overheating.  TUG: terminated due to overheated. ,  Ambulate hall: able to perform with two seated rest breaks on 05/20/20 (181 ft with 2 seated rest breaks)  Stand and lift one leg onto 4" step : R 0.76 inch, L unable to clear   New goal: ABC: 30.63%   Treatment:  isometric contraction against PT resistance: -hip flexion 10x 3 second hold -hip abduction 10x 3 second hold -hip adduction 10x 3 second holds    3lb dumbell:  -three way ER 8x each direction  -lateral and forward raises 10x -woodchop 10x each direction  Ice pack donned throughout session to minimize exacerbation of MS symptoms in order to perform interventions.  Pt educated throughout session about proper posture and technique with exercises. Improved exercise technique, movement at target joints, use of target muscles after min to mod verbal, visual, tactile cues.      Patient's goals are limited in performance this session due to patient overheating requiring termination of tasks to reduce risk of exacerbation. She has made excellent progress with functional ambulation capacity ambulating >181 ft with only two seated rest breaks. She additionally is improving with functional mobility with decreased time requiring for sit to stand transition. The TUG and 10 MWT will be performed next session, please refer to that note for further details. Patient's quality of movement and control of mobility has improved since last recert and will continue improving with focused therapeutic intervention.  The patient would benefit from continued skilled PT intervention to maximize QOL, safety, and function.                PT Education - 06/01/20 1607    Education provided Yes    Education Details exercise technique, body mechanics, goals. POC    Person(s) Educated Patient    Methods Explanation;Demonstration;Tactile cues;Verbal cues    Comprehension Verbalized understanding;Returned demonstration;Verbal cues required;Tactile cues required            PT Short Term Goals - 06/01/20 1611      PT SHORT TERM GOAL #1   Title Patient will be independent in home exercise program to improve strength/mobility for better functional independence with ADLs.    Baseline 5/19: HEP next session 6/22: HEP compliant 8/10 : HEP progressions compliant 8/31 HEP compliant 10/5: HEP compliant    Time 4    Period Weeks    Status Achieved     Target Date 01/13/20      PT SHORT TERM GOAL #2   Title Patient will perform a sit to stand with SUE support for increased ind. with transfers.    Baseline 5/19: heavy BUE support 6/22: requires one hand on walker one hand on w/c 8/10: able to perform intermittently (some days can some days cannot) 8/31: attempt next session 10/5: BUE reliance, patient unable to complete 4 full stands. Attempt next session 1/25: able to perform from elevated surface only    Time 4    Period Weeks    Status Partially Met    Target Date 06/29/20             PT Long Term Goals - 06/01/20 0001      PT LONG TERM GOAL #1   Title Patient will increase  FOTO score to equal to or greater than  53/100   to demonstrate statistically significant improvement in mobility and quality of life.     Baseline 8/31: 48.13% 7/27:  48.1% 6/22: 46.99% 5/18: 48/100 10/5: 49/100 11/2: 48%  12/30: 46.9% 1/25: 46.99%    Time 12    Period Weeks    Status Partially Met    Target Date 08/24/20      PT LONG TERM GOAL #2   Title Patient (< 41 years old) will complete five times sit to stand test in < 20 seconds indicating an increased LE strength and improved balance.    Baseline 7/27: 26.8 seconds with one partial stand 6/22: 40. 86 sec; unable to reach full stand last two attempts 5/19: 42 seconds 10/5: BUE reliance, patient unable to complete 4 full stands. Attempt next session 11/2: 23.71 seconds 12/30: 30.32 with one near LOB due to spasms 1/25: 27.11 seconds with one hand on RW one hand on chair    Time 12    Period Weeks    Status Partially Met    Target Date 08/24/20      PT LONG TERM GOAL #3   Title Patient will increase 10 meter walk test to <20 seconds as to improve gait speed for better community ambulation and to reduce fall risk.    Baseline 8/31: 35.1 seconds with RW 7/27: 36 seconds with RW 6/22: 36.1 seconds 5/19: 42 seconds 10/5: 31.9 seconds with RW 11/2: 29.9 seconds with RW 12/30: deferred due to spasm 1/25:  defer to next session    Time 12    Period Weeks    Status Deferred    Target Date 08/24/20      PT LONG TERM GOAL #4   Title Patient will reduce timed up and go to <11 seconds to reduce fall risk and demonstrate improved transfer/gait ability.    Baseline 8/31: 50.8 seconds with RW 7/27: 43.65 6/22: 45.4 seconds 5/19: 47 seconds 10/5: 38.5 seconds with RW 11/2: 35.72 seconds with RW 12/30: 43.5 seconds with wheels wet. 1/25: deferred    Time 12    Period Weeks    Status Partially Met    Target Date 08/24/20      PT LONG TERM GOAL #5   Title Patient will ambulate from physical therapy gym to elevator down the hall, ride elevator to first floor, and walk to valet with RW and chair follow for increased capacity for mobility and ind.    Baseline 8/31: able to open door ~50 ft with one rest break 8/10: unable to ambulate to elevator without rest break 9/30: able to ambulate to elevator with 3 rest breaks 1/25: 181 ft with 2 seated rest break (to elevator)    Time 12    Period Weeks    Status Partially Met    Target Date 08/24/20      PT LONG TERM GOAL #6   Title Patient will be able to stand with RW and lift one leg onto 4" step for carryover to negotiation of steps at home.     Baseline 11/2: unable to perform 1/25: R foot 0.75 inch lift    Time 12    Period Weeks    Status Partially Met    Target Date 08/24/20                 Plan - 06/01/20 1611    Clinical Impression Statement Patient's goals are limited in performance this session due to  patient overheating requiring termination of tasks to reduce risk of exacerbation. She has made excellent progress with functional ambulation capacity ambulating >181 ft with only two seated rest breaks. She additionally is improving with functional mobility with decreased time requiring for sit to stand transition. The TUG and 10 MWT will be performed next session, please refer to that note for further details. Patient's quality of movement  and control of mobility has improved since last recert and will continue improving with focused therapeutic intervention.  The patient would benefit from continued skilled PT intervention to maximize QOL, safety, and function.    Personal Factors and Comorbidities Age;Comorbidity 3+;Fitness;Past/Current Experience;Sex;Time since onset of injury/illness/exacerbation    Comorbidities HTN, SVT, neurogenic bowel, MS (1995), neuropathy, hx of wrist fx, HPV, Hypercholesteremia, adiposity, osteopenia, spasticity.    Examination-Activity Limitations Bathing;Bed Mobility;Bend;Carry;Continence;Dressing;Hygiene/Grooming;Stairs;Squat;Reach Overhead;Locomotion Level;Lift;Stand;Transfers    Examination-Participation Restrictions Church;Cleaning;Community Activity;Laundry;Volunteer;Shop;Meal Prep;Yard Work    [employer] Evolving/Moderate complexity    Rehab Potential Good    PT Frequency 2x / week    PT Duration 12 weeks    PT Treatment/Interventions ADLs/Self Care Home Management;Aquatic Therapy;Biofeedback;Cryotherapy;Electrical Stimulation;Iontophoresis 23m/ml Dexamethasone;Traction;Moist Heat;Ultrasound;DME Instruction;Gait training;Stair training;Functional mobility training;Therapeutic activities;Therapeutic exercise;Balance training;Neuromuscular re-education;Patient/family education;Orthotic Fit/Training;Wheelchair mobility training;Manual techniques;Compression bandaging;Passive range of motion;Dry needling;Energy conservation;Taping;Splinting;Vestibular    PT Next Visit Plan step negotiation, turning directions to the L and R    PT Home Exercise Plan [place] Agree with Plan of Care Patient           Patient will benefit from skilled therapeutic intervention in order to improve the following deficits and impairments:  Abnormal gait,Decreased activity tolerance,Decreased balance,Decreased coordination,Decreased endurance,Decreased  mobility,Decreased range of motion,Decreased strength,Difficulty walking,Impaired perceived functional ability,Impaired sensation,Impaired tone,Impaired UE functional use,Improper body mechanics,Postural dysfunction,Cardiopulmonary status limiting activity,Impaired flexibility,Increased muscle spasms  Visit Diagnosis: Multiple sclerosis exacerbation ([facility])  Muscle weakness (generalized)  Other abnormalities of gait and mobility  Unsteadiness on feet     Problem List Patient Active Problem List   Diagnosis Date Noted  . Hypoalbuminemia due to protein-calorie malnutrition ([place]   . Neurogenic bowel   . Neurogenic bladder   . Labile blood pressure   . Neuropathic pain   . Abscess of female pelvis   . SVT (supraventricular tachycardia) ([place]   . Radial styloid tenosynovitis 03/12/2018  . Wheelchair confinement 02/27/2018  . Localized osteoporosis with current pathological fracture with routine healing 01/19/2017  . Wrist fracture 01/16/2017  . Sprain of ankle 03/23/2016  . Closed fracture of lateral malleolus 03/16/2016  . Health care maintenance 01/24/2016  . Blood pressure elevated without history of HTN 10/25/2015  . Essential hypertension 10/25/2015  . Multiple sclerosis ([place] 10/02/2015  . Chronic left shoulder pain 07/19/2015  . Multiple sclerosis exacerbation ([place] 07/14/2015  . MS (multiple sclerosis) ([place] 11/26/2014  . Increased body mass index 11/26/2014  . HPV test positive 11/26/2014  . Status post laparoscopic supracervical hysterectomy 11/26/2014  . Galactorrhea 11/26/2014  . Back ache 05/21/2014  . Adiposity 05/21/2014  . Disordered sleep 05/21/2014  . Muscle spasticity 05/21/2014  . Spasticity 05/21/2014  . Calculus of kidney 12/09/2013  . Renal colic 004/[id number]/9774 . Hypercholesteremia 08/19/2013  . Hereditary and idiopathic neuropathy 08/19/2013  . Hypercholesterolemia without hypertriglyceridemia 08/19/2013  . Bladder infection, chronic 07/25/2012  .  Disorder of bladder function 07/25/2012  . Incomplete bladder emptying 07/25/2012  . Microscopic hematuria 07/25/2012  . Right upper quadrant pain 07/25/2012   MJanna Arch PT, DPT  06/01/2020, 4:20 PM  [place] MAIN [facility] SERVICES [address] [place], [place], [address] Phone: [phone]   Fax:  [phone]  Name: Akua Blethen Duley MRN: [id number] Date of Birth: [birth year]

## 2020-06-03 ENCOUNTER — Ambulatory Visit: Payer: PPO

## 2020-06-03 ENCOUNTER — Other Ambulatory Visit: Payer: Self-pay

## 2020-06-03 DIAGNOSIS — R2689 Other abnormalities of gait and mobility: Secondary | ICD-10-CM

## 2020-06-03 DIAGNOSIS — G35 Multiple sclerosis: Secondary | ICD-10-CM | POA: Diagnosis not present

## 2020-06-03 DIAGNOSIS — M6281 Muscle weakness (generalized): Secondary | ICD-10-CM

## 2020-06-03 DIAGNOSIS — R2681 Unsteadiness on feet: Secondary | ICD-10-CM

## 2020-06-03 NOTE — Therapy (Signed)
[place] MAIN [facility] SERVICES [address] [place], [place], [address] Phone: [phone]   Fax:  [phone]  Physical Therapy Treatment  Patient Details  Name: Lisa Williams MRN: [id number] Date of Birth: 03-20-1961 Referring Provider (PT): Gurney Maxin, MD   Encounter Date: 06/03/2020   PT End of Session - 06/03/20 6314    Visit Number 39    Number of Visits 91    Date for PT Re-Evaluation 08/24/20    Authorization Type 8/10    PT Start Time 1016    PT Stop Time 1059    PT Time Calculation (min) 43 min    Equipment Utilized During Treatment Gait belt;Other (comment)   ice pack   Activity Tolerance Patient tolerated treatment well    Behavior During Therapy WFL for tasks assessed/performed           Past Medical History:  Diagnosis Date  . Abdominal pain, right upper quadrant   . Back pain   . Calculus of kidney 12/09/2013  . Chronic back pain    unspecified  . Chronic left shoulder pain 07/19/2015  . Complication of anesthesia   . Functional disorder of bladder    other  . Galactorrhea 11/26/2014   Chronic   . Hereditary and idiopathic neuropathy 08/19/2013  . HPV test positive   . Hypercholesteremia 08/19/2013  . Hypertension   . Incomplete bladder emptying   . Microscopic hematuria   . MS (multiple sclerosis) ([place])   . Muscle spasticity 05/21/2014  . Nonspecific findings on examination of urine    other  . Osteopenia   . PONV (postoperative nausea and vomiting)   . Status post laparoscopic supracervical hysterectomy 11/26/2014  . Tobacco user 11/26/2014  . Wrist fracture     Past Surgical History:  Procedure Laterality Date  . bilateral tubal ligation  1996  . BREAST CYST EXCISION Left 2002  . FRACTURE SURGERY    . KNEE SURGERY     right  . LAPAROSCOPIC SUPRACERVICAL HYSTERECTOMY  08/05/2013  . ORIF WRIST FRACTURE Left 01/17/2017   Procedure: OPEN REDUCTION INTERNAL FIXATION (ORIF) WRIST FRACTURE;   Surgeon: Lovell Sheehan, MD;  Location: [facility];  Service: Orthopedics;  Laterality: Left;  . RADIOLOGY WITH ANESTHESIA N/A 03/18/2020   Procedure: MRI WITH ANESTHESIA CERVICAL SPINE AND BRAIN  WITH AND WITHOUT CONTRAST;  Surgeon: Radiologist, Medication, MD;  Location: [facility];  Service: Radiology;  Laterality: N/A;  . TUBAL LIGATION Bilateral   . VAGINAL HYSTERECTOMY  03/2006    There were no vitals filed for this visit.   Subjective Assessment - 06/03/20 1617    Subjective Patient is aware she needs to do two of her tests from her last session today. Has eaten and prepared for it.    Pertinent History Patient is previous patient of this therapist, was admitted to the hospital on 08/03/19 for MS exacerbation and then went to inpatient rehab on 08/08/19. Patient then received home health therapy and now is returning to outpatient PT. PMh includes HTN, SVT, neurogenic bowel, MS (1995), neuropathy, hx of wrist fx, HPV, Hypercholesteremia, adiposity, osteopenia, spasticity. She wears bilateral AFOs and uses a RW and/or wheelchair for mobility. She drives an adapted car.    Limitations Lifting;Standing;Walking;House hold activities    How long can you sit comfortably? n/a    How long can you stand comfortably? 4 minutes    How long can you walk comfortably? walked 50 ft in inpatient rehab  Currently in Pain? No/denies               TUG: 38.9 seconds with RW (improved) 10 MWT: First trial 32 seconds with RW and w/c follow ; second trial 29 seconds with RW (improvement)  Treatment Seated boxing: cues for sequencing, body mechanics, and pace/rhythm for functional contraction and timing of muscle recruitment: -Cross body punches to mitts on PT hands with no back support for focused core stabilization with crossing midline 2x 60 seconds  -Cross body punch with secondary combination of elbow for full cross body rotation to PT mitt for trunk stability, coordination, and cardiovascular challenge  x 60 seconds -Upper cut to mitts in PT hands for core activation without back support with perturbations 60 seconds  isometric contraction against PT resistance: -hip flexion 10x 3 second hold -hip abduction 10x 3 second hold -hip adduction 10x 3 second holds     Ice pack donned throughout session to minimize exacerbation of MS symptoms in order to perform interventions.  Pt educated throughout session about proper posture and technique with exercises. Improved exercise technique, movement at target joints, use of target muscles after min to mod verbal, visual, tactile cues.    Patient demonstrates progression of 10 MWT and TUG goals. She is able to perform seated interventions afterwards but standing interventions terminated due to knee buckling from fatigue. Patient continues to demonstrate excellent motivation throughout physical therapy session. Patient's quality of movement and control of mobility has improved since last recert and will continue improving with focused therapeutic intervention. The patient would benefit from continued skilled PT intervention to maximize QOL, safety, and function                PT Education - 06/03/20 1618    Education provided Yes    Education Details goals, Geophysicist/field seismologist) Educated Patient    Methods Demonstration;Explanation;Tactile cues;Verbal cues    Comprehension Verbalized understanding;Returned demonstration;Verbal cues required;Tactile cues required            PT Short Term Goals - 06/01/20 1611      PT SHORT TERM GOAL #1   Title Patient will be independent in home exercise program to improve strength/mobility for better functional independence with ADLs.    Baseline 5/19: HEP next session 6/22: HEP compliant 8/10 : HEP progressions compliant 8/31 HEP compliant 10/5: HEP compliant    Time 4    Period Weeks    Status Achieved    Target Date 01/13/20      PT SHORT TERM GOAL #2   Title Patient will perform a  sit to stand with SUE support for increased ind. with transfers.    Baseline 5/19: heavy BUE support 6/22: requires one hand on walker one hand on w/c 8/10: able to perform intermittently (some days can some days cannot) 8/31: attempt next session 10/5: BUE reliance, patient unable to complete 4 full stands. Attempt next session 1/25: able to perform from elevated surface only    Time 4    Period Weeks    Status Partially Met    Target Date 06/29/20             PT Long Term Goals - 06/03/20 0001      PT LONG TERM GOAL #1   Title Patient will increase FOTO score to equal to or greater than  53/100   to demonstrate statistically significant improvement in mobility and quality of life.     Baseline 8/31: 48.13% 7/27:  48.1% 6/22: 46.99% 5/18: 48/100 10/5: 49/100 11/2: 48%  12/30: 46.9% 1/25: 46.99%    Time 12    Period Weeks    Status Partially Met    Target Date 08/24/20      PT LONG TERM GOAL #2   Title Patient (< 27 years old) will complete five times sit to stand test in < 20 seconds indicating an increased LE strength and improved balance.    Baseline 7/27: 26.8 seconds with one partial stand 6/22: 40. 86 sec; unable to reach full stand last two attempts 5/19: 42 seconds 10/5: BUE reliance, patient unable to complete 4 full stands. Attempt next session 11/2: 23.71 seconds 12/30: 30.32 with one near LOB due to spasms 1/25: 27.11 seconds with one hand on RW one hand on chair    Time 12    Period Weeks    Status Partially Met    Target Date 08/24/20      PT LONG TERM GOAL #3   Title Patient will increase 10 meter walk test to <20 seconds as to improve gait speed for better community ambulation and to reduce fall risk.    Baseline 8/31: 35.1 seconds with RW 7/27: 36 seconds with RW 6/22: 36.1 seconds 5/19: 42 seconds 10/5: 31.9 seconds with RW 11/2: 29.9 seconds with RW 12/30: deferred due to spasm 1/25: defer to next session 1/27: 29 seconds with RW    Time 12    Status Partially Met     Target Date 08/24/20      PT LONG TERM GOAL #4   Title Patient will reduce timed up and go to <11 seconds to reduce fall risk and demonstrate improved transfer/gait ability.    Baseline 8/31: 50.8 seconds with RW 7/27: 43.65 6/22: 45.4 seconds 5/19: 47 seconds 10/5: 38.5 seconds with RW 11/2: 35.72 seconds with RW 12/30: 43.5 seconds with wheels wet. 1/25: deferred 1/27: 38.9 seconds    Time 12    Period Weeks    Status Partially Met    Target Date 08/24/20      PT LONG TERM GOAL #5   Title Patient will ambulate from physical therapy gym to elevator down the hall, ride elevator to first floor, and walk to valet with RW and chair follow for increased capacity for mobility and ind.    Baseline 8/31: able to open door ~50 ft with one rest break 8/10: unable to ambulate to elevator without rest break 9/30: able to ambulate to elevator with 3 rest breaks 1/25: 181 ft with 2 seated rest break (to elevator)    Time 12    Period Weeks    Status Partially Met    Target Date 08/24/20      PT LONG TERM GOAL #6   Title Patient will be able to stand with RW and lift one leg onto 4" step for carryover to negotiation of steps at home.     Baseline 11/2: unable to perform 1/25: R foot 0.75 inch lift    Time 12    Period Weeks    Status Partially Met    Target Date 08/24/20                 Plan - 06/03/20 1623    Clinical Impression Statement Patient demonstrates progression of 10 MWT and TUG goals. She is able to perform seated interventions afterwards but standing interventions terminated due to knee buckling from fatigue. Patient continues to demonstrate excellent motivation throughout physical therapy session. Patient's  quality of movement and control of mobility has improved since last recert and will continue improving with focused therapeutic intervention. The patient would benefit from continued skilled PT intervention to maximize QOL, safety, and function    Personal Factors and  Comorbidities Age;Comorbidity 3+;Fitness;Past/Current Experience;Sex;Time since onset of injury/illness/exacerbation    Comorbidities HTN, SVT, neurogenic bowel, MS (1995), neuropathy, hx of wrist fx, HPV, Hypercholesteremia, adiposity, osteopenia, spasticity.    Examination-Activity Limitations Bathing;Bed Mobility;Bend;Carry;Continence;Dressing;Hygiene/Grooming;Stairs;Squat;Reach Overhead;Locomotion Level;Lift;Stand;Transfers    Examination-Participation Restrictions Church;Cleaning;Community Activity;Laundry;Volunteer;Shop;Meal Prep;Yard Work    Merchant navy officer Evolving/Moderate complexity    Rehab Potential Good    PT Frequency 2x / week    PT Duration 12 weeks    PT Treatment/Interventions ADLs/Self Care Home Management;Aquatic Therapy;Biofeedback;Cryotherapy;Electrical Stimulation;Iontophoresis 20m/ml Dexamethasone;Traction;Moist Heat;Ultrasound;DME Instruction;Gait training;Stair training;Functional mobility training;Therapeutic activities;Therapeutic exercise;Balance training;Neuromuscular re-education;Patient/family education;Orthotic Fit/Training;Wheelchair mobility training;Manual techniques;Compression bandaging;Passive range of motion;Dry needling;Energy conservation;Taping;Splinting;Vestibular    PT Next Visit Plan step negotiation, turning directions to the L and R    PT Home Exercise Plan maintain current [place]P [place] Agree with Plan of Care Patient           Patient will benefit from skilled therapeutic intervention in order to improve the following deficits and impairments:  Abnormal gait,Decreased activity tolerance,Decreased balance,Decreased coordination,Decreased endurance,Decreased mobility,Decreased range of motion,Decreased strength,Difficulty walking,Impaired perceived functional ability,Impaired sensation,Impaired tone,Impaired UE functional use,Improper body mechanics,Postural dysfunction,Cardiopulmonary status limiting  activity,Impaired flexibility,Increased muscle spasms  Visit Diagnosis: Multiple sclerosis exacerbation (HCC)  Muscle weakness (generalized)  Other abnormalities of gait and mobility  Unsteadiness on feet     Problem List Patient Active Problem List   Diagnosis Date Noted  . Hypoalbuminemia due to protein-calorie malnutrition ([place]   . Neurogenic bowel   . Neurogenic bladder   . Labile blood pressure   . Neuropathic pain   . Abscess of female pelvis   . SVT (supraventricular tachycardia) ([place]   . Radial styloid tenosynovitis 03/12/2018  . Wheelchair confinement 02/27/2018  . Localized osteoporosis with current pathological fracture with routine healing 01/19/2017  . Wrist fracture 01/16/2017  . Sprain of ankle 03/23/2016  . Closed fracture of lateral malleolus 03/16/2016  . Health care maintenance 01/24/2016  . Blood pressure elevated without history of HTN 10/25/2015  . Essential hypertension 10/25/2015  . Multiple sclerosis ([place] 10/02/2015  . Chronic left shoulder pain 07/19/2015  . Multiple sclerosis exacerbation ([place] 07/14/2015  . MS (multiple sclerosis) ([place] 11/26/2014  . Increased body mass index 11/26/2014  . HPV test positive 11/26/2014  . Status post laparoscopic supracervical hysterectomy 11/26/2014  . Galactorrhea 11/26/2014  . Back ache 05/21/2014  . Adiposity 05/21/2014  . Disordered sleep 05/21/2014  . Muscle spasticity 05/21/2014  . Spasticity 05/21/2014  . Calculus of kidney 12/09/2013  . Renal colic 002/03/1551 . Hypercholesteremia 08/19/2013  . Hereditary and idiopathic neuropathy 08/19/2013  . Hypercholesterolemia without hypertriglyceridemia 08/19/2013  . Bladder infection, chronic 07/25/2012  . Disorder of bladder function 07/25/2012  . Incomplete bladder emptying 07/25/2012  . Microscopic hematuria 07/25/2012  . Right upper quadrant pain 07/25/2012   MJanna Arch PT, DPT   06/03/2020, 4:24 PM  [facility] [address] [place] [address]Phone: [phone]  Fax:  [phone] Name: Lisa Williams MRN: [id number] of Birth: [birth year]

## 2020-06-08 ENCOUNTER — Ambulatory Visit: Payer: PPO | Attending: Neurology

## 2020-06-08 ENCOUNTER — Other Ambulatory Visit: Payer: Self-pay

## 2020-06-08 DIAGNOSIS — R2689 Other abnormalities of gait and mobility: Secondary | ICD-10-CM | POA: Diagnosis not present

## 2020-06-08 DIAGNOSIS — M6281 Muscle weakness (generalized): Secondary | ICD-10-CM | POA: Diagnosis not present

## 2020-06-08 DIAGNOSIS — G35 Multiple sclerosis: Secondary | ICD-10-CM | POA: Insufficient documentation

## 2020-06-08 DIAGNOSIS — R2681 Unsteadiness on feet: Secondary | ICD-10-CM | POA: Diagnosis not present

## 2020-06-08 NOTE — Therapy (Signed)
[place] MAIN [facility] [address] [place], [place], [address] Phone: [phone]   Fax:  [phone]  Physical Therapy Treatment  Patient Details  Name: Lisa Williams MRN: [id number] Date of Birth: [birth year] Referring Provider (PT): Gurney Maxin, MD   Encounter Date: 06/08/2020   PT End of Session - 06/08/20 1239    Visit Number 17    Number of Visits 91    Date for PT Re-Evaluation 08/24/20    Authorization Type 9/10    PT Start Time 1015    PT Stop Time 1059    PT Time Calculation (min) 44 min    Equipment Utilized During Treatment Gait belt;Other (comment)   ice pack   Activity Tolerance Patient tolerated treatment well    Behavior During Therapy WFL for tasks assessed/performed           Past Medical History:  Diagnosis Date  . Abdominal pain, right upper quadrant   . Back pain   . Calculus of kidney 12/09/2013  . Chronic back pain    unspecified  . Chronic left shoulder pain 07/19/2015  . Complication of anesthesia   . Functional disorder of bladder    other  . Galactorrhea 11/26/2014   Chronic   . Hereditary and idiopathic neuropathy 08/19/2013  . HPV test positive   . Hypercholesteremia 08/19/2013  . Hypertension   . Incomplete bladder emptying   . Microscopic hematuria   . MS (multiple sclerosis) ([place])   . Muscle spasticity 05/21/2014  . Nonspecific findings on examination of urine    other  . Osteopenia   . PONV (postoperative nausea and vomiting)   . Status post laparoscopic supracervical hysterectomy 11/26/2014  . Tobacco user 11/26/2014  . Wrist fracture     Past Surgical History:  Procedure Laterality Date  . bilateral tubal ligation  1996  . BREAST CYST EXCISION Left 2002  . FRACTURE SURGERY    . KNEE SURGERY     right  . LAPAROSCOPIC SUPRACERVICAL HYSTERECTOMY  08/05/2013  . ORIF WRIST FRACTURE Left 01/17/2017   Procedure: OPEN REDUCTION INTERNAL FIXATION (ORIF) WRIST FRACTURE;   Surgeon: Lovell Sheehan, MD;  Location: [facility];  Service: Orthopedics;  Laterality: Left;  . RADIOLOGY WITH ANESTHESIA N/A 03/18/2020   Procedure: MRI WITH ANESTHESIA CERVICAL SPINE AND BRAIN  WITH AND WITHOUT CONTRAST;  Surgeon: Radiologist, Medication, MD;  Location: [place];  Service: Radiology;  Laterality: N/A;  . TUBAL LIGATION Bilateral   . VAGINAL HYSTERECTOMY  03/2006    There were no vitals filed for this visit.   Subjective Assessment - 06/08/20 1236    Subjective Patient reports she has been having some spasms despite medicine. No falls or LOB since last session.    Pertinent History Patient is previous patient of this therapist, was admitted to the hospital on 08/03/19 for MS exacerbation and then went to inpatient rehab on 08/08/19. Patient then received home health therapy and now is returning to outpatient PT. PMh includes HTN, SVT, neurogenic bowel, MS (1995), neuropathy, hx of wrist fx, HPV, Hypercholesteremia, adiposity, osteopenia, spasticity. She wears bilateral AFOs and uses a RW and/or wheelchair for mobility. She drives an adapted car.    Limitations Lifting;Standing;Walking;House hold activities    How long can you sit comfortably? n/a    How long can you stand comfortably? 4 minutes    How long can you walk comfortably? walked 50 ft in inpatient rehab    Currently in Pain? No/denies  Standing in // bars: -Standing weight shifts2x10each side; UE support focus on hip shift rather than pull with arm -forward reach and grab cone off side of 6" step and pass to PT x 4 each side.   Patient standing interventions terminated due to spasm of back.   Seated: STM to lumbar spinewith focus on L parapsinals and quadratus lumborum x 11 minutes; patient reports relief of spasm by end of session  Swiss ball forward reach 10x forward, 10x diagonal  Hand clap with PT for core activation, sequencing, dual task, and UE strengthening x 45 seconds x 3  trials Hamstring stretch on PT leg 2x30 seconds; x 2 sets  RTB abduction 15x Adduction ball squeeze 15x     Ice pack donned throughout session to minimize exacerbation of MS symptoms in order to perform interventions.    Pt educated throughout session about proper posture and technique with exercises. Improved exercise technique, movement at target joints, use of target muscles after min to mod verbal, visual, tactile cues                      PT Education - 06/08/20 1238    Education provided Yes    Education Details exercise technique, body mechanics    Person(s) Educated Patient    Methods Explanation;Demonstration;Tactile cues;Verbal cues    Comprehension Verbalized understanding;Returned demonstration;Verbal cues required;Tactile cues required            PT Short Term Goals - 06/01/20 1611      PT SHORT TERM GOAL #1   Title Patient will be independent in home exercise program to improve strength/mobility for better functional independence with ADLs.    Baseline 5/19: HEP next session 6/22: HEP compliant 8/10 : HEP progressions compliant 8/31 HEP compliant 10/5: HEP compliant    Time 4    Period Weeks    Status Achieved    Target Date 01/13/20      PT SHORT TERM GOAL #2   Title Patient will perform a sit to stand with SUE support for increased ind. with transfers.    Baseline 5/19: heavy BUE support 6/22: requires one hand on walker one hand on w/c 8/10: able to perform intermittently (some days can some days cannot) 8/31: attempt next session 10/5: BUE reliance, patient unable to complete 4 full stands. Attempt next session 1/25: able to perform from elevated surface only    Time 4    Period Weeks    Status Partially Met    Target Date 06/29/20             PT Long Term Goals - 06/03/20 0001      PT LONG TERM GOAL #1   Title Patient will increase FOTO score to equal to or greater than  53/100   to demonstrate statistically significant  improvement in mobility and quality of life.     Baseline 8/31: 48.13% 7/27:  48.1% 6/22: 46.99% 5/18: 48/100 10/5: 49/100 11/2: 48%  12/30: 46.9% 1/25: 46.99%    Time 12    Period Weeks    Status Partially Met    Target Date 08/24/20      PT LONG TERM GOAL #2   Title Patient (< 59 years old) will complete five times sit to stand test in < 20 seconds indicating an increased LE strength and improved balance.    Baseline 7/27: 26.8 seconds with one partial stand 6/22: 40. 86 sec; unable to reach full stand last two attempts  5/19: 42 seconds 10/5: BUE reliance, patient unable to complete 4 full stands. Attempt next session 11/2: 23.71 seconds 12/30: 30.32 with one near LOB due to spasms 1/25: 27.11 seconds with one hand on RW one hand on chair    Time 12    Period Weeks    Status Partially Met    Target Date 08/24/20      PT LONG TERM GOAL #3   Title Patient will increase 10 meter walk test to <20 seconds as to improve gait speed for better community ambulation and to reduce fall risk.    Baseline 8/31: 35.1 seconds with RW 7/27: 36 seconds with RW 6/22: 36.1 seconds 5/19: 42 seconds 10/5: 31.9 seconds with RW 11/2: 29.9 seconds with RW 12/30: deferred due to spasm 1/25: defer to next session 1/27: 29 seconds with RW    Time 12    Status Partially Met    Target Date 08/24/20      PT LONG TERM GOAL #4   Title Patient will reduce timed up and go to <11 seconds to reduce fall risk and demonstrate improved transfer/gait ability.    Baseline 8/31: 50.8 seconds with RW 7/27: 43.65 6/22: 45.4 seconds 5/19: 47 seconds 10/5: 38.5 seconds with RW 11/2: 35.72 seconds with RW 12/30: 43.5 seconds with wheels wet. 1/25: deferred 1/27: 38.9 seconds    Time 12    Period Weeks    Status Partially Met    Target Date 08/24/20      PT LONG TERM GOAL #5   Title Patient will ambulate from physical therapy gym to elevator down the hall, ride elevator to first floor, and walk to valet with RW and chair follow  for increased capacity for mobility and ind.    Baseline 8/31: able to open door ~50 ft with one rest break 8/10: unable to ambulate to elevator without rest break 9/30: able to ambulate to elevator with 3 rest breaks 1/25: 181 ft with 2 seated rest break (to elevator)    Time 12    Period Weeks    Status Partially Met    Target Date 08/24/20      PT LONG TERM GOAL #6   Title Patient will be able to stand with RW and lift one leg onto 4" step for carryover to negotiation of steps at home.     Baseline 11/2: unable to perform 1/25: R foot 0.75 inch lift    Time 12    Period Weeks    Status Partially Met    Target Date 08/24/20                 Plan - 06/08/20 1243    Clinical Impression Statement Patient's standing is limited by back spasms. She continues to be highly motivated despite pain. Core activation is progressing with increasing complexity and difficulty leveled activities reaching. The patient would benefit from continued skilled PT intervention to maximize QOL, safety, and function    Personal Factors and Comorbidities Age;Comorbidity 3+;Fitness;Past/Current Experience;Sex;Time since onset of injury/illness/exacerbation    Comorbidities HTN, SVT, neurogenic bowel, MS (1995), neuropathy, hx of wrist fx, HPV, Hypercholesteremia, adiposity, osteopenia, spasticity.    Examination-Activity Limitations Bathing;Bed Mobility;Bend;Carry;Continence;Dressing;Hygiene/Grooming;Stairs;Squat;Reach Overhead;Locomotion Level;Lift;Stand;Transfers    Examination-Participation Restrictions Church;Cleaning;Community Activity;Laundry;Volunteer;Shop;Meal Prep;Yard Work    [employer] Evolving/Moderate complexity    Rehab Potential Good    PT Frequency 2x / week    PT Duration 12 weeks    PT Treatment/Interventions ADLs/Self Care Home Management;Aquatic Therapy;Biofeedback;Cryotherapy;Electrical Stimulation;Iontophoresis 3m/ml  Dexamethasone;Traction;Moist  Heat;Ultrasound;DME Instruction;Gait training;Stair training;Functional mobility training;Therapeutic activities;Therapeutic exercise;Balance training;Neuromuscular re-education;Patient/family education;Orthotic Fit/Training;Wheelchair mobility training;Manual techniques;Compression bandaging;Passive range of motion;Dry needling;Energy conservation;Taping;Splinting;Vestibular    PT Next Visit Plan step negotiation, turning directions to the L and R    PT Home Exercise Plan maintain current HEP from [place] and Agree with Plan of Care Patient           Patient will benefit from skilled therapeutic intervention in order to improve the following deficits and impairments:  Abnormal gait,Decreased activity tolerance,Decreased balance,Decreased coordination,Decreased endurance,Decreased mobility,Decreased range of motion,Decreased strength,Difficulty walking,Impaired perceived functional ability,Impaired sensation,Impaired tone,Impaired UE functional use,Improper body mechanics,Postural dysfunction,Cardiopulmonary status limiting activity,Impaired flexibility,Increased muscle spasms  Visit Diagnosis: Multiple sclerosis exacerbation ([facility])  Muscle weakness (generalized)  Other abnormalities of gait and mobility  Unsteadiness on feet     Problem List Patient Active Problem List   Diagnosis Date Noted  . Hypoalbuminemia due to protein-calorie malnutrition ([place])   . Neurogenic bowel   . Neurogenic bladder   . Labile blood pressure   . Neuropathic pain   . Abscess of female pelvis   . SVT (supraventricular tachycardia) ([place])   . Radial styloid tenosynovitis 03/12/2018  . Wheelchair confinement 02/27/2018  . Localized osteoporosis with current pathological fracture with routine healing 01/19/2017  . Wrist fracture 01/16/2017  . Sprain of ankle 03/23/2016  . Closed fracture of lateral malleolus 03/16/2016  . Health care maintenance 01/24/2016  . Blood pressure elevated without  history of HTN 10/25/2015  . Essential hypertension 10/25/2015  . Multiple sclerosis ([place]) 10/02/2015  . Chronic left shoulder pain 07/19/2015  . Multiple sclerosis exacerbation ([place]) 07/14/2015  . MS (multiple sclerosis) ([place]) 11/26/2014  . Increased body mass index 11/26/2014  . HPV test positive 11/26/2014  . Status post laparoscopic supracervical hysterectomy 11/26/2014  . Galactorrhea 11/26/2014  . Back ache 05/21/2014  . Adiposity 05/21/2014  . Disordered sleep 05/21/2014  . Muscle spasticity 05/21/2014  . Spasticity 05/21/2014  . Calculus of kidney 12/09/2013  . Renal colic 17/40/8144  . Hypercholesteremia 08/19/2013  . Hereditary and idiopathic neuropathy 08/19/2013  . Hypercholesterolemia without hypertriglyceridemia 08/19/2013  . Bladder infection, chronic 07/25/2012  . Disorder of bladder function 07/25/2012  . Incomplete bladder emptying 07/25/2012  . Microscopic hematuria 07/25/2012  . Right upper quadrant pain 07/25/2012   Janna Arch, PT, DPT   06/08/2020, 12:45 PM  [place] MAIN [facility] SERVICES [address] [place], [place], [address] Phone: [phone]   Fax:  [phone]  Name: Lisa Williams MRN: [id number] Date of Birth: 06/19/1960

## 2020-06-10 ENCOUNTER — Ambulatory Visit: Payer: PPO

## 2020-06-10 ENCOUNTER — Other Ambulatory Visit: Payer: Self-pay

## 2020-06-10 DIAGNOSIS — M6281 Muscle weakness (generalized): Secondary | ICD-10-CM

## 2020-06-10 DIAGNOSIS — R2681 Unsteadiness on feet: Secondary | ICD-10-CM

## 2020-06-10 DIAGNOSIS — R2689 Other abnormalities of gait and mobility: Secondary | ICD-10-CM

## 2020-06-10 DIAGNOSIS — G35 Multiple sclerosis: Secondary | ICD-10-CM

## 2020-06-10 NOTE — Therapy (Signed)
[place] [facility] SERVICES [address] [place], [place], [address] Phone: [phone]   Fax:  [phone]  Physical Therapy Treatment Physical Therapy Progress Note   Dates of reporting period  05/04/20   to   06/10/20   Patient Details  Name: Lisa Williams MRN: [id number] Date of Birth: 04-Dec-1960 Referring Provider (PT): Gurney Maxin, MD   Encounter Date: 06/10/2020   PT End of Session - 06/10/20 1252    Visit Number 70    Number of Visits 91    Date for PT Re-Evaluation 08/24/20    Authorization Type 10/10; next session 1/10 PN 2/3    PT Start Time 1015    PT Stop Time 1058    PT Time Calculation (min) 43 min    Equipment Utilized During Treatment Gait belt;Other (comment)   ice pack   Activity Tolerance Patient tolerated treatment well    Behavior During Therapy WFL for tasks assessed/performed           Past Medical History:  Diagnosis Date  . Abdominal pain, right upper quadrant   . Back pain   . Calculus of kidney 12/09/2013  . Chronic back pain    unspecified  . Chronic left shoulder pain 07/19/2015  . Complication of anesthesia   . Functional disorder of bladder    other  . Galactorrhea 11/26/2014   Chronic   . Hereditary and idiopathic neuropathy 08/19/2013  . HPV test positive   . Hypercholesteremia 08/19/2013  . Hypertension   . Incomplete bladder emptying   . Microscopic hematuria   . MS (multiple sclerosis) ([place])   . Muscle spasticity 05/21/2014  . Nonspecific findings on examination of urine    other  . Osteopenia   . PONV (postoperative nausea and vomiting)   . Status post laparoscopic supracervical hysterectomy 11/26/2014  . Tobacco user 11/26/2014  . Wrist fracture     Past Surgical History:  Procedure Laterality Date  . bilateral tubal ligation  1996  . BREAST CYST EXCISION Left 2002  . FRACTURE SURGERY    . KNEE SURGERY     right  . LAPAROSCOPIC SUPRACERVICAL HYSTERECTOMY  08/05/2013   . ORIF WRIST FRACTURE Left 01/17/2017   Procedure: OPEN REDUCTION INTERNAL FIXATION (ORIF) WRIST FRACTURE;  Surgeon: Lovell Sheehan, MD;  Location: [facility] ORS;  Service: Orthopedics;  Laterality: Left;  . RADIOLOGY WITH ANESTHESIA N/A 03/18/2020   Procedure: MRI WITH ANESTHESIA CERVICAL SPINE AND BRAIN  WITH AND WITHOUT CONTRAST;  Surgeon: Radiologist, Medication, MD;  Location: [facility];  Service: Radiology;  Laterality: N/A;  . TUBAL LIGATION Bilateral   . VAGINAL HYSTERECTOMY  03/2006    There were no vitals filed for this visit.   Subjective Assessment - 06/10/20 1251    Subjective Patient reports feeling well. No falls or LOB since last session.    Pertinent History Patient is previous patient of this therapist, was admitted to the hospital on 08/03/19 for MS exacerbation and then went to inpatient rehab on 08/08/19. Patient then received home health therapy and now is returning to outpatient PT. PMh includes HTN, SVT, neurogenic bowel, MS (1995), neuropathy, hx of wrist fx, HPV, Hypercholesteremia, adiposity, osteopenia, spasticity. She wears bilateral AFOs and uses a RW and/or wheelchair for mobility. She drives an adapted car.    Limitations Lifting;Standing;Walking;House hold activities    How long can you sit comfortably? n/a    How long can you stand comfortably? 4 minutes    How  long can you walk comfortably? walked 50 ft in inpatient rehab    Currently in Pain? No/denies                  Gait: Patient ambulates215f, 3 seated rest breaks CGA and wheelchair follow for safety. Patient able to navigate opening doors, turns, and thresholds independently but with inc time to perform.Patient demonstrates inc reliance on BUE by end of gait trial.  Seated: Balloon taps reaching inside/outside BOS x 4 minutes RTB abduction 15x  Forward/backward trunk lean for modified seated sit upx12; very challenging  isometric contraction against PT resistance: -hip flexion 10x 3  second hold -hip abduction 10x 3 second hold   Ice pack donned throughout session to minimize exacerbation of MS symptoms in order to perform interventions.  Pt educated throughout session about proper posture and technique with exercises. Improved exercise technique, movement at target joints, use of target muscles after min to mod verbal, visual, tactile cues.     Patient ambulated for the longest duration to date this session with only three seated rest breaks. Patient very fatigued by end of session but pleased with progress. The patient would benefit from continued skilled PT intervention to maximize QOL, safety, and function                   PT Education - 06/10/20 1252    Education provided Yes    Education Details exercise technique, body mechanics    Person(s) Educated Patient    Methods Explanation;Demonstration;Tactile cues;Verbal cues    Comprehension Verbalized understanding;Returned demonstration;Verbal cues required;Tactile cues required            PT Short Term Goals - 06/01/20 1611      PT SHORT TERM GOAL #1   Title Patient will be independent in home exercise program to improve strength/mobility for better functional independence with ADLs.    Baseline 5/19: HEP next session 6/22: HEP compliant 8/10 : HEP progressions compliant 8/31 HEP compliant 10/5: HEP compliant    Time 4    Period Weeks    Status Achieved    Target Date 01/13/20      PT SHORT TERM GOAL #2   Title Patient will perform a sit to stand with SUE support for increased ind. with transfers.    Baseline 5/19: heavy BUE support 6/22: requires one hand on walker one hand on w/c 8/10: able to perform intermittently (some days can some days cannot) 8/31: attempt next session 10/5: BUE reliance, patient unable to complete 4 full stands. Attempt next session 1/25: able to perform from elevated surface only    Time 4    Period Weeks    Status Partially Met    Target Date 06/29/20              PT Long Term Goals - 06/03/20 0001      PT LONG TERM GOAL #1   Title Patient will increase FOTO score to equal to or greater than  53/100   to demonstrate statistically significant improvement in mobility and quality of life.     Baseline 8/31: 48.13% 7/27:  48.1% 6/22: 46.99% 5/18: 48/100 10/5: 49/100 11/2: 48%  12/30: 46.9% 1/25: 46.99%    Time 12    Period Weeks    Status Partially Met    Target Date 08/24/20      PT LONG TERM GOAL #2   Title Patient (< 639years old) will complete five times sit to stand test in <  20 seconds indicating an increased LE strength and improved balance.    Baseline 7/27: 26.8 seconds with one partial stand 6/22: 40. 86 sec; unable to reach full stand last two attempts 5/19: 42 seconds 10/5: BUE reliance, patient unable to complete 4 full stands. Attempt next session 11/2: 23.71 seconds 12/30: 30.32 with one near LOB due to spasms 1/25: 27.11 seconds with one hand on RW one hand on chair    Time 12    Period Weeks    Status Partially Met    Target Date 08/24/20      PT LONG TERM GOAL #3   Title Patient will increase 10 meter walk test to <20 seconds as to improve gait speed for better community ambulation and to reduce fall risk.    Baseline 8/31: 35.1 seconds with RW 7/27: 36 seconds with RW 6/22: 36.1 seconds 5/19: 42 seconds 10/5: 31.9 seconds with RW 11/2: 29.9 seconds with RW 12/30: deferred due to spasm 1/25: defer to next session 1/27: 29 seconds with RW    Time 12    Status Partially Met    Target Date 08/24/20      PT LONG TERM GOAL #4   Title Patient will reduce timed up and go to <11 seconds to reduce fall risk and demonstrate improved transfer/gait ability.    Baseline 8/31: 50.8 seconds with RW 7/27: 43.65 6/22: 45.4 seconds 5/19: 47 seconds 10/5: 38.5 seconds with RW 11/2: 35.72 seconds with RW 12/30: 43.5 seconds with wheels wet. 1/25: deferred 1/27: 38.9 seconds    Time 12    Period Weeks    Status Partially Met    Target  Date 08/24/20      PT LONG TERM GOAL #5   Title Patient will ambulate from physical therapy gym to elevator down the hall, ride elevator to first floor, and walk to valet with RW and chair follow for increased capacity for mobility and ind.    Baseline 8/31: able to open door ~50 ft with one rest break 8/10: unable to ambulate to elevator without rest break 9/30: able to ambulate to elevator with 3 rest breaks 1/25: 181 ft with 2 seated rest break (to elevator)    Time 12    Period Weeks    Status Partially Met    Target Date 08/24/20      PT LONG TERM GOAL #6   Title Patient will be able to stand with RW and lift one leg onto 4" step for carryover to negotiation of steps at home.     Baseline 11/2: unable to perform 1/25: R foot 0.75 inch lift    Time 12    Period Weeks    Status Partially Met    Target Date 08/24/20                 Plan - 06/10/20 1255    Clinical Impression Statement Patient ambulated for the longest duration to date this session with only three seated rest breaks. Patient very fatigued by end of session but pleased with progress. The patient would benefit from continued skilled PT intervention to maximize QOL, safety, and function    Personal Factors and Comorbidities Age;Comorbidity 3+;Fitness;Past/Current Experience;Sex;Time since onset of injury/illness/exacerbation    Comorbidities HTN, SVT, neurogenic bowel, MS (1995), neuropathy, hx of wrist fx, HPV, Hypercholesteremia, adiposity, osteopenia, spasticity.    Examination-Activity Limitations Bathing;Bed Mobility;Bend;Carry;Continence;Dressing;Hygiene/Grooming;Stairs;Squat;Reach Overhead;Locomotion Level;Lift;Stand;Transfers    Examination-Participation Restrictions Church;Cleaning;Community Activity;Laundry;Volunteer;Shop;Meal Prep;Yard Work    [employer] Evolving/Moderate complexity  Rehab Potential Good    PT Frequency 2x / week    PT Duration 12 weeks    PT  Treatment/Interventions ADLs/Self Care Home Management;Aquatic Therapy;Biofeedback;Cryotherapy;Electrical Stimulation;Iontophoresis 65m/ml Dexamethasone;Traction;Moist Heat;Ultrasound;DME Instruction;Gait training;Stair training;Functional mobility training;Therapeutic activities;Therapeutic exercise;Balance training;Neuromuscular re-education;Patient/family education;Orthotic Fit/Training;Wheelchair mobility training;Manual techniques;Compression bandaging;Passive range of motion;Dry needling;Energy conservation;Taping;Splinting;Vestibular    PT Next Visit Plan step negotiation, turning directions to the L and R    PT Home Exercise Plan maintain current HEP from [facility] Agree with Plan of Care Patient           Patient will benefit from skilled therapeutic intervention in order to improve the following deficits and impairments:  Abnormal gait,Decreased activity tolerance,Decreased balance,Decreased coordination,Decreased endurance,Decreased mobility,Decreased range of motion,Decreased strength,Difficulty walking,Impaired perceived functional ability,Impaired sensation,Impaired tone,Impaired UE functional use,Improper body mechanics,Postural dysfunction,Cardiopulmonary status limiting activity,Impaired flexibility,Increased muscle spasms  Visit Diagnosis: Multiple sclerosis exacerbation ([facility])  Muscle weakness (generalized)  Other abnormalities of gait and mobility  Unsteadiness on feet     Problem List Patient Active Problem List   Diagnosis Date Noted  . Hypoalbuminemia due to protein-calorie malnutrition ([place]   . Neurogenic bowel   . Neurogenic bladder   . Labile blood pressure   . Neuropathic pain   . Abscess of female pelvis   . SVT (supraventricular tachycardia) ([place]   . Radial styloid tenosynovitis 03/12/2018  . Wheelchair confinement 02/27/2018  . Localized osteoporosis with current pathological fracture with routine healing 01/19/2017  . Wrist fracture  01/16/2017  . Sprain of ankle 03/23/2016  . Closed fracture of lateral malleolus 03/16/2016  . Health care maintenance 01/24/2016  . Blood pressure elevated without history of HTN 10/25/2015  . Essential hypertension 10/25/2015  . Multiple sclerosis ([facility] 10/02/2015  . Chronic left shoulder pain 07/19/2015  . Multiple sclerosis exacerbation ([place] 07/14/2015  . MS (multiple sclerosis) ([place] 11/26/2014  . Increased body mass index 11/26/2014  . HPV test positive 11/26/2014  . Status post laparoscopic supracervical hysterectomy 11/26/2014  . Galactorrhea 11/26/2014  . Back ache 05/21/2014  . Adiposity 05/21/2014  . Disordered sleep 05/21/2014  . Muscle spasticity 05/21/2014  . Spasticity 05/21/2014  . Calculus of kidney 12/09/2013  . Renal colic 072/25/7505 . Hypercholesteremia 08/19/2013  . Hereditary and idiopathic neuropathy 08/19/2013  . Hypercholesterolemia without hypertriglyceridemia 08/19/2013  . Bladder infection, chronic 07/25/2012  . Disorder of bladder function 07/25/2012  . Incomplete bladder emptying 07/25/2012  . Microscopic hematuria 07/25/2012  . Right upper quadrant pain 07/25/2012   MJanna Williams PT, DPT   06/10/2020, 12:55 PM  [facility] [address] [address]Phone: [phone]  Fax:  [phone] Name: JSivan CuelloRamseur MRN: [id number] of Birth: [birth year]

## 2020-06-15 ENCOUNTER — Ambulatory Visit: Payer: PPO

## 2020-06-15 ENCOUNTER — Other Ambulatory Visit: Payer: Self-pay

## 2020-06-15 DIAGNOSIS — R2689 Other abnormalities of gait and mobility: Secondary | ICD-10-CM

## 2020-06-15 DIAGNOSIS — G35 Multiple sclerosis: Secondary | ICD-10-CM

## 2020-06-15 DIAGNOSIS — M6281 Muscle weakness (generalized): Secondary | ICD-10-CM

## 2020-06-15 DIAGNOSIS — R2681 Unsteadiness on feet: Secondary | ICD-10-CM

## 2020-06-15 NOTE — Therapy (Signed)
[place] [facility] SERVICES [address] [place], [place], [address] Phone: [phone]   Fax:  [phone]  Physical Therapy Treatment  Patient Details  Name: Lisa Williams MRN: [id number] Date of Birth: [birth year] Referring Provider (PT): Gurney Maxin, MD   Encounter Date: 06/15/2020   PT End of Session - 06/15/20 1223    Visit Number 40    Number of Visits 91    Date for PT Re-Evaluation 08/24/20    Authorization Type 1/10 PN 2/3    PT Start Time 1005    PT Stop Time 1051    PT Time Calculation (min) 46 min    Equipment Utilized During Treatment Gait belt;Other (comment)   ice pack   Activity Tolerance Patient tolerated treatment well    Behavior During Therapy WFL for tasks assessed/performed           Past Medical History:  Diagnosis Date  . Abdominal pain, right upper quadrant   . Back pain   . Calculus of kidney 12/09/2013  . Chronic back pain    unspecified  . Chronic left shoulder pain 07/19/2015  . Complication of anesthesia   . Functional disorder of bladder    other  . Galactorrhea 11/26/2014   Chronic   . Hereditary and idiopathic neuropathy 08/19/2013  . HPV test positive   . Hypercholesteremia 08/19/2013  . Hypertension   . Incomplete bladder emptying   . Microscopic hematuria   . MS (multiple sclerosis) ([place])   . Muscle spasticity 05/21/2014  . Nonspecific findings on examination of urine    other  . Osteopenia   . PONV (postoperative nausea and vomiting)   . Status post laparoscopic supracervical hysterectomy 11/26/2014  . Tobacco user 11/26/2014  . Wrist fracture     Past Surgical History:  Procedure Laterality Date  . bilateral tubal ligation  1996  . BREAST CYST EXCISION Left 2002  . FRACTURE SURGERY    . KNEE SURGERY     right  . LAPAROSCOPIC SUPRACERVICAL HYSTERECTOMY  08/05/2013  . ORIF WRIST FRACTURE Left 01/17/2017   Procedure: OPEN REDUCTION INTERNAL FIXATION (ORIF) WRIST FRACTURE;   Surgeon: Lovell Sheehan, MD;  Location: [facility];  Service: Orthopedics;  Laterality: Left;  . RADIOLOGY WITH ANESTHESIA N/A 03/18/2020   Procedure: MRI WITH ANESTHESIA CERVICAL SPINE AND BRAIN  WITH AND WITHOUT CONTRAST;  Surgeon: Radiologist, Medication, MD;  Location: [place];  Service: Radiology;  Laterality: N/A;  . TUBAL LIGATION Bilateral   . VAGINAL HYSTERECTOMY  03/2006    There were no vitals filed for this visit.   Subjective Assessment - 06/15/20 1221    Subjective Patient reports new onset of L upper trap/cervical pain that began yesterday and limited her mobility.    Pertinent History Patient is previous patient of this therapist, was admitted to the hospital on 08/03/19 for MS exacerbation and then went to inpatient rehab on 08/08/19. Patient then received home health therapy and now is returning to outpatient PT. PMh includes HTN, SVT, neurogenic bowel, MS (1995), neuropathy, hx of wrist fx, HPV, Hypercholesteremia, adiposity, osteopenia, spasticity. She wears bilateral AFOs and uses a RW and/or wheelchair for mobility. She drives an adapted car.    Limitations Lifting;Standing;Walking;House hold activities    How long can you sit comfortably? n/a    How long can you stand comfortably? 4 minutes    How long can you walk comfortably? walked 50 ft in inpatient rehab    Currently in Pain?  Yes    Pain Score 5     Pain Location Neck    Pain Orientation Left    Pain Descriptors / Indicators Aching    Pain Type Chronic pain    Pain Onset Yesterday    Pain Frequency Constant                     Standing in // bars:  -Standing weight shifts 2x10 each side; UE support focus on hip shift rather than pull with arm -step forward/tandem stance hold with UE support 4x each LE placement, without UE support 3x10 second holds each LE   -mini squat with chair behind 8x    Seated:  STM to L cervical and upper trap musculature  x 11 minutes; implementation of effleurage and pettrisage  as well as trigger point.  patient reports relief of spasm by end of session  LUE nerve glide 15x  Towel cervical stretch side bend 3x30 second holds Towel stretch extension 2x30 second holds  Hamstring stretch on PT leg 2x30 seconds; x 2 sets  RTB abduction 15x Adduction ball squeeze 15x         Ice pack donned throughout session to minimize exacerbation of MS symptoms in order to perform interventions.      Pt educated throughout session about proper posture and technique with exercises. Improved exercise technique, movement at target joints, use of target muscles after min to mod verbal, visual, tactile cues                        PT Education - 06/15/20 1223    Education provided Yes    Education Details exercise technique, body mechanics, pain reduction technique    Person(s) Educated Patient    Methods Explanation;Demonstration;Tactile cues;Verbal cues    Comprehension Verbalized understanding;Returned demonstration;Verbal cues required;Tactile cues required            PT Short Term Goals - 06/01/20 1611      PT SHORT TERM GOAL #1   Title Patient will be independent in home exercise program to improve strength/mobility for better functional independence with ADLs.    Baseline 5/19: HEP next session 6/22: HEP compliant 8/10 : HEP progressions compliant 8/31 HEP compliant 10/5: HEP compliant    Time 4    Period Weeks    Status Achieved    Target Date 01/13/20      PT SHORT TERM GOAL #2   Title Patient will perform a sit to stand with SUE support for increased ind. with transfers.    Baseline 5/19: heavy BUE support 6/22: requires one hand on walker one hand on w/c 8/10: able to perform intermittently (some days can some days cannot) 8/31: attempt next session 10/5: BUE reliance, patient unable to complete 4 full stands. Attempt next session 1/25: able to perform from elevated surface only    Time 4    Period Weeks    Status Partially Met    Target  Date 06/29/20             PT Long Term Goals - 06/03/20 0001      PT LONG TERM GOAL #1   Title Patient will increase FOTO score to equal to or greater than  53/100   to demonstrate statistically significant improvement in mobility and quality of life.     Baseline 8/31: 48.13% 7/27:  48.1% 6/22: 46.99% 5/18: 48/100 10/5: 49/100 11/2: 48%  12/30: 46.9% 1/25: 46.99%  Time 12    Period Weeks    Status Partially Met    Target Date 08/24/20      PT LONG TERM GOAL #2   Title Patient (< 99 years old) will complete five times sit to stand test in < 20 seconds indicating an increased LE strength and improved balance.    Baseline 7/27: 26.8 seconds with one partial stand 6/22: 40. 86 sec; unable to reach full stand last two attempts 5/19: 42 seconds 10/5: BUE reliance, patient unable to complete 4 full stands. Attempt next session 11/2: 23.71 seconds 12/30: 30.32 with one near LOB due to spasms 1/25: 27.11 seconds with one hand on RW one hand on chair    Time 12    Period Weeks    Status Partially Met    Target Date 08/24/20      PT LONG TERM GOAL #3   Title Patient will increase 10 meter walk test to <20 seconds as to improve gait speed for better community ambulation and to reduce fall risk.    Baseline 8/31: 35.1 seconds with RW 7/27: 36 seconds with RW 6/22: 36.1 seconds 5/19: 42 seconds 10/5: 31.9 seconds with RW 11/2: 29.9 seconds with RW 12/30: deferred due to spasm 1/25: defer to next session 1/27: 29 seconds with RW    Time 12    Status Partially Met    Target Date 08/24/20      PT LONG TERM GOAL #4   Title Patient will reduce timed up and go to <11 seconds to reduce fall risk and demonstrate improved transfer/gait ability.    Baseline 8/31: 50.8 seconds with RW 7/27: 43.65 6/22: 45.4 seconds 5/19: 47 seconds 10/5: 38.5 seconds with RW 11/2: 35.72 seconds with RW 12/30: 43.5 seconds with wheels wet. 1/25: deferred 1/27: 38.9 seconds    Time 12    Period Weeks    Status Partially  Met    Target Date 08/24/20      PT LONG TERM GOAL #5   Title Patient will ambulate from physical therapy gym to elevator down the hall, ride elevator to first floor, and walk to valet with RW and chair follow for increased capacity for mobility and ind.    Baseline 8/31: able to open door ~50 ft with one rest break 8/10: unable to ambulate to elevator without rest break 9/30: able to ambulate to elevator with 3 rest breaks 1/25: 181 ft with 2 seated rest break (to elevator)    Time 12    Period Weeks    Status Partially Met    Target Date 08/24/20      PT LONG TERM GOAL #6   Title Patient will be able to stand with RW and lift one leg onto 4" step for carryover to negotiation of steps at home.     Baseline 11/2: unable to perform 1/25: R foot 0.75 inch lift    Time 12    Period Weeks    Status Partially Met    Target Date 08/24/20                 Plan - 06/15/20 1237    Clinical Impression Statement Patient presents with new onset of L upper trap/cervical pain limiting prolonged standing. Pain is reduced by end of session with manual and therex implementation. Patient re-educated on towel stretch and nerve glides and demonstrated understanding. The patient would benefit from continued skilled PT intervention to maximize QOL, safety, and function    Personal  Factors and Comorbidities Age;Comorbidity 3+;Fitness;Past/Current Experience;Sex;Time since onset of injury/illness/exacerbation    Comorbidities HTN, SVT, neurogenic bowel, MS (1995), neuropathy, hx of wrist fx, HPV, Hypercholesteremia, adiposity, osteopenia, spasticity.    Examination-Activity Limitations Bathing;Bed Mobility;Bend;Carry;Continence;Dressing;Hygiene/Grooming;Stairs;Squat;Reach Overhead;Locomotion Level;Lift;Stand;Transfers    Examination-Participation Restrictions Church;Cleaning;Community Activity;Laundry;Volunteer;Shop;Meal Prep;Yard Work    [employer] Evolving/Moderate complexity     Rehab Potential Good    PT Frequency 2x / week    PT Duration 12 weeks    PT Treatment/Interventions ADLs/Self Care Home Management;Aquatic Therapy;Biofeedback;Cryotherapy;Electrical Stimulation;Iontophoresis 107m/ml Dexamethasone;Traction;Moist Heat;Ultrasound;DME Instruction;Gait training;Stair training;Functional mobility training;Therapeutic activities;Therapeutic exercise;Balance training;Neuromuscular re-education;Patient/family education;Orthotic Fit/Training;Wheelchair mobility training;Manual techniques;Compression bandaging;Passive range of motion;Dry needling;Energy conservation;Taping;Splinting;Vestibular    PT Next Visit Plan step negotiation, turning directions to the L and R    PT Home Exercise Plan [place] Agree with Plan of Care Patient           Patient will benefit from skilled therapeutic intervention in order to improve the following deficits and impairments:  Abnormal gait,Decreased activity tolerance,Decreased balance,Decreased coordination,Decreased endurance,Decreased mobility,Decreased range of motion,Decreased strength,Difficulty walking,Impaired perceived functional ability,Impaired sensation,Impaired tone,Impaired UE functional use,Improper body mechanics,Postural dysfunction,Cardiopulmonary status limiting activity,Impaired flexibility,Increased muscle spasms  Visit Diagnosis: Multiple sclerosis exacerbation ([facility])  Muscle weakness (generalized)  Other abnormalities of gait and mobility  Unsteadiness on feet     Problem List Patient Active Problem List   Diagnosis Date Noted  . Hypoalbuminemia due to protein-calorie malnutrition ([place]   . Neurogenic bowel   . Neurogenic bladder   . Labile blood pressure   . Neuropathic pain   . Abscess of female pelvis   . SVT (supraventricular tachycardia) ([place]   . Radial styloid tenosynovitis 03/12/2018  . Wheelchair confinement 02/27/2018  . Localized osteoporosis with  current pathological fracture with routine healing 01/19/2017  . Wrist fracture 01/16/2017  . Sprain of ankle 03/23/2016  . Closed fracture of lateral malleolus 03/16/2016  . Health care maintenance 01/24/2016  . Blood pressure elevated without history of HTN 10/25/2015  . Essential hypertension 10/25/2015  . Multiple sclerosis ([facility] 10/02/2015  . Chronic left shoulder pain 07/19/2015  . Multiple sclerosis exacerbation ([place] 07/14/2015  . MS (multiple sclerosis) ([place] 11/26/2014  . Increased body mass index 11/26/2014  . HPV test positive 11/26/2014  . Status post laparoscopic supracervical hysterectomy 11/26/2014  . Galactorrhea 11/26/2014  . Back ache 05/21/2014  . Adiposity 05/21/2014  . Disordered sleep 05/21/2014  . Muscle spasticity 05/21/2014  . Spasticity 05/21/2014  . Calculus of kidney 12/09/2013  . Renal colic 062/44/6950 . Hypercholesteremia 08/19/2013  . Hereditary and idiopathic neuropathy 08/19/2013  . Hypercholesterolemia without hypertriglyceridemia 08/19/2013  . Bladder infection, chronic 07/25/2012  . Disorder of bladder function 07/25/2012  . Incomplete bladder emptying 07/25/2012  . Microscopic hematuria 07/25/2012  . Right upper quadrant pain 07/25/2012   MJanna Arch PT, DPT   06/15/2020, 12:38 PM  [facility] [address] [address]Phone: [phone]  Fax:  [phone] Name: Lisa RuzichRamseur MRN: [id number] of Birth: 6Nov 03, 1962

## 2020-06-16 NOTE — Addendum Note (Signed)
Addended by: Claudie Fisherman on: 06/16/2020 05:41 PM   Modules accepted: Orders

## 2020-06-17 ENCOUNTER — Other Ambulatory Visit: Payer: Self-pay

## 2020-06-17 ENCOUNTER — Ambulatory Visit: Payer: PPO

## 2020-06-17 DIAGNOSIS — R2689 Other abnormalities of gait and mobility: Secondary | ICD-10-CM

## 2020-06-17 DIAGNOSIS — G35 Multiple sclerosis: Secondary | ICD-10-CM | POA: Diagnosis not present

## 2020-06-17 DIAGNOSIS — R2681 Unsteadiness on feet: Secondary | ICD-10-CM

## 2020-06-17 DIAGNOSIS — M6281 Muscle weakness (generalized): Secondary | ICD-10-CM

## 2020-06-17 NOTE — Therapy (Signed)
[place] MAIN [facility] SERVICES [address] [place], [place], [address] Phone: [phone]   Fax:  [phone]  Physical Therapy Treatment  Patient Details  Name: Lisa Williams Kary MRN: [id number] Date of Birth: 13-May-1960 Referring Provider (PT): Gurney Maxin, MD   Encounter Date: 06/17/2020   PT End of Session - 06/17/20 1411    Visit Number [id number]    Number of Visits 91    Date for PT Re-Evaluation 08/24/20    Authorization Type 2/10 PN 2/3    PT Start Time 1015    PT Stop Time 1059    PT Time Calculation (min) 44 min    Equipment Utilized During Treatment Gait belt;Other (comment)   ice pack   Activity Tolerance Patient tolerated treatment well    Behavior During Therapy WFL for tasks assessed/performed           Past Medical History:  Diagnosis Date  . Abdominal pain, right upper quadrant   . Back pain   . Calculus of kidney 12/09/2013  . Chronic back pain    unspecified  . Chronic left shoulder pain 07/19/2015  . Complication of anesthesia   . Functional disorder of bladder    other  . Galactorrhea 11/26/2014   Chronic   . Hereditary and idiopathic neuropathy 08/19/2013  . HPV test positive   . Hypercholesteremia 08/19/2013  . Hypertension   . Incomplete bladder emptying   . Microscopic hematuria   . MS (multiple sclerosis) ([place])   . Muscle spasticity 05/21/2014  . Nonspecific findings on examination of urine    other  . Osteopenia   . PONV (postoperative nausea and vomiting)   . Status post laparoscopic supracervical hysterectomy 11/26/2014  . Tobacco user 11/26/2014  . Wrist fracture     Past Surgical History:  Procedure Laterality Date  . bilateral tubal ligation  1996  . BREAST CYST EXCISION Left 2002  . FRACTURE SURGERY    . KNEE SURGERY     right  . LAPAROSCOPIC SUPRACERVICAL HYSTERECTOMY  08/05/2013  . ORIF WRIST FRACTURE Left 01/17/2017   Procedure: OPEN REDUCTION INTERNAL FIXATION (ORIF) WRIST  FRACTURE;  Surgeon: Lovell Sheehan, MD;  Location: [facility];  Service: Orthopedics;  Laterality: Left;  . RADIOLOGY WITH ANESTHESIA N/A 03/18/2020   Procedure: MRI WITH ANESTHESIA CERVICAL SPINE AND BRAIN  WITH AND WITHOUT CONTRAST;  Surgeon: Radiologist, Medication, MD;  Location: [employer] [facility];  Service: Radiology;  Laterality: N/A;  . TUBAL LIGATION Bilateral   . VAGINAL HYSTERECTOMY  03/2006    There were no vitals filed for this visit.   Subjective Assessment - 06/17/20 1409    Subjective Patient reports her upper trap continues to be painful. No falls or LOB since last session.    Pertinent History Patient is previous patient of this therapist, was admitted to the hospital on 08/03/19 for MS exacerbation and then went to inpatient rehab on 08/08/19. Patient then received home health therapy and now is returning to outpatient PT. PMh includes HTN, SVT, neurogenic bowel, MS (1995), neuropathy, hx of wrist fx, HPV, Hypercholesteremia, adiposity, osteopenia, spasticity. She wears bilateral AFOs and uses a RW and/or wheelchair for mobility. She drives an adapted car.    Limitations Lifting;Standing;Walking;House hold activities    How long can you sit comfortably? n/a    How long can you stand comfortably? 4 minutes    How long can you walk comfortably? walked 50 ft in inpatient rehab    Currently in Pain?  Yes    Pain Score 3     Pain Location Neck    Pain Orientation Left    Pain Descriptors / Indicators Aching    Pain Type Chronic pain    Pain Onset Yesterday    Pain Frequency Constant                 Gait: Patient ambulates 175ft, 1 seated rest breaks CGA and wheelchair follow for safety. Patient able to differentiate knee and hip flexion/extension allowing for a more neutral gait pattern. Patient demonstrates inc reliance on BUE by end of gait trial.     Seated:  Balloon taps reaching inside/outside BOS x 4 minutes Swiss ball trunk flexion 10x, diagonal 10x  Swiss ball overhead  raise 12x   isometric contraction against PT resistance: -hip flexion 10x 3 second hold -hip abduction 10x 3 second hold    STM to L cervical and upper trap musculature  x15minutes; implementation of effleurage and pettrisage as well as trigger point.  patient reports relief of spasm by end of session  Ice pack donned throughout session to minimize exacerbation of MS symptoms in order to perform interventions.    Pt educated throughout session about proper posture and technique with exercises. Improved exercise technique, movement at target joints, use of target muscles after min to mod verbal, visual, tactile cues.       Patient's ambulation continues to be limited by upper trap pain this session. Core activation is progressing with increasing complexity and difficulty leveled activities reaching. Pain is reduced by end of session with manual and therex. The patient would benefit from continued skilled PT intervention to maximize QOL, safety, and function                      PT Education - 06/17/20 1411    Education provided Yes    Education Details exercise technique, body mechanics    Person(s) Educated Patient    Methods Explanation;Demonstration;Tactile cues;Verbal cues    Comprehension Verbalized understanding;Returned demonstration;Verbal cues required;Tactile cues required            PT Short Term Goals - 06/01/20 1611      PT SHORT TERM GOAL #1   Title Patient will be independent in home exercise program to improve strength/mobility for better functional independence with ADLs.    Baseline 5/19: HEP next session 6/22: HEP compliant 8/10 : HEP progressions compliant 8/31 HEP compliant 10/5: HEP compliant    Time 4    Period Weeks    Status Achieved    Target Date 01/13/20      PT SHORT TERM GOAL #2   Title Patient will perform a sit to stand with SUE support for increased ind. with transfers.    Baseline 5/19: heavy BUE support 6/22: requires one  hand on walker one hand on w/c 8/10: able to perform intermittently (some days can some days cannot) 8/31: attempt next session 10/5: BUE reliance, patient unable to complete 4 full stands. Attempt next session 1/25: able to perform from elevated surface only    Time 4    Period Weeks    Status Partially Met    Target Date 06/29/20             PT Long Term Goals - 06/03/20 0001      PT LONG TERM GOAL #1   Title Patient will increase FOTO score to equal to or greater than  53/100   to demonstrate statistically  significant improvement in mobility and quality of life.     Baseline 8/31: 48.13% 7/27:  48.1% 6/22: 46.99% 5/18: 48/100 10/5: 49/100 11/2: 48%  12/30: 46.9% 1/25: 46.99%    Time 12    Period Weeks    Status Partially Met    Target Date 08/24/20      PT LONG TERM GOAL #2   Title Patient (< 64 years old) will complete five times sit to stand test in < 20 seconds indicating an increased LE strength and improved balance.    Baseline 7/27: 26.8 seconds with one partial stand 6/22: 40. 86 sec; unable to reach full stand last two attempts 5/19: 42 seconds 10/5: BUE reliance, patient unable to complete 4 full stands. Attempt next session 11/2: 23.71 seconds 12/30: 30.32 with one near LOB due to spasms 1/25: 27.11 seconds with one hand on RW one hand on chair    Time 12    Period Weeks    Status Partially Met    Target Date 08/24/20      PT LONG TERM GOAL #3   Title Patient will increase 10 meter walk test to <20 seconds as to improve gait speed for better community ambulation and to reduce fall risk.    Baseline 8/31: 35.1 seconds with RW 7/27: 36 seconds with RW 6/22: 36.1 seconds 5/19: 42 seconds 10/5: 31.9 seconds with RW 11/2: 29.9 seconds with RW 12/30: deferred due to spasm 1/25: defer to next session 1/27: 29 seconds with RW    Time 12    Status Partially Met    Target Date 08/24/20      PT LONG TERM GOAL #4   Title Patient will reduce timed up and go to <11 seconds to  reduce fall risk and demonstrate improved transfer/gait ability.    Baseline 8/31: 50.8 seconds with RW 7/27: 43.65 6/22: 45.4 seconds 5/19: 47 seconds 10/5: 38.5 seconds with RW 11/2: 35.72 seconds with RW 12/30: 43.5 seconds with wheels wet. 1/25: deferred 1/27: 38.9 seconds    Time 12    Period Weeks    Status Partially Met    Target Date 08/24/20      PT LONG TERM GOAL #5   Title Patient will ambulate from physical therapy gym to elevator down the hall, ride elevator to first floor, and walk to valet with RW and chair follow for increased capacity for mobility and ind.    Baseline 8/31: able to open door ~50 ft with one rest break 8/10: unable to ambulate to elevator without rest break 9/30: able to ambulate to elevator with 3 rest breaks 1/25: 181 ft with 2 seated rest break (to elevator)    Time 12    Period Weeks    Status Partially Met    Target Date 08/24/20      PT LONG TERM GOAL #6   Title Patient will be able to stand with RW and lift one leg onto 4" step for carryover to negotiation of steps at home.     Baseline 11/2: unable to perform 1/25: R foot 0.75 inch lift    Time 12    Period Weeks    Status Partially Met    Target Date 08/24/20                 Plan - 06/17/20 1415    Clinical Impression Statement Patient's ambulation continues to be limited by upper trap pain this session. Core activation is progressing with increasing complexity and difficulty  leveled activities reaching. Pain is reduced by end of session with manual and therex. The patient would benefit from continued skilled PT intervention to maximize QOL, safety, and function    Personal Factors and Comorbidities Age;Comorbidity 3+;Fitness;Past/Current Experience;Sex;Time since onset of injury/illness/exacerbation    Comorbidities HTN, SVT, neurogenic bowel, MS (1995), neuropathy, hx of wrist fx, HPV, Hypercholesteremia, adiposity, osteopenia, spasticity.    Examination-Activity Limitations Bathing;Bed  Mobility;Bend;Carry;Continence;Dressing;Hygiene/Grooming;Stairs;Squat;Reach Overhead;Locomotion Level;Lift;Stand;Transfers    Examination-Participation Restrictions Church;Cleaning;Community Activity;Laundry;Volunteer;Shop;Meal Prep;Yard Work    [employer] Evolving/Moderate complexity    Rehab Potential Good    PT Frequency 2x / week    PT Duration 12 weeks    PT Treatment/Interventions ADLs/Self Care Home Management;Aquatic Therapy;Biofeedback;Cryotherapy;Electrical Stimulation;Iontophoresis 17m/ml Dexamethasone;Traction;Moist Heat;Ultrasound;DME Instruction;Gait training;Stair training;Functional mobility training;Therapeutic activities;Therapeutic exercise;Balance training;Neuromuscular re-education;Patient/family education;Orthotic Fit/Training;Wheelchair mobility training;Manual techniques;Compression bandaging;Passive range of motion;Dry needling;Energy conservation;Taping;Splinting;Vestibular    PT Next Visit Plan step negotiation, turning directions to the L and R    PT Home Exercise Plan [place] [facility] [facility] [place]gree with Plan of Care Patient           Patient will benefit from skilled therapeutic intervention in order to improve the following deficits and impairments:  Abnormal gait,Decreased activity tolerance,Decreased balance,Decreased coordination,Decreased endurance,Decreased mobility,Decreased range of motion,Decreased strength,Difficulty walking,Impaired perceived functional ability,Impaired sensation,Impaired tone,Impaired UE functional use,Improper body mechanics,Postural dysfunction,Cardiopulmonary status limiting activity,Impaired flexibility,Increased muscle spasms  Visit Diagnosis: Multiple sclerosis exacerbation ([facility])  Muscle weakness (generalized)  Other abnormalities of gait and mobility  Unsteadiness on feet     Problem List Patient Active Problem List   Diagnosis Date Noted  . Hypoalbuminemia due to  protein-calorie malnutrition ([place]   . Neurogenic bowel   . Neurogenic bladder   . Labile blood pressure   . Neuropathic pain   . Abscess of female pelvis   . SVT (supraventricular tachycardia) ([place]   . Radial styloid tenosynovitis 03/12/2018  . Wheelchair confinement 02/27/2018  . Localized osteoporosis with current pathological fracture with routine healing 01/19/2017  . Wrist fracture 01/16/2017  . Sprain of ankle 03/23/2016  . Closed fracture of lateral malleolus 03/16/2016  . Health care maintenance 01/24/2016  . Blood pressure elevated without history of HTN 10/25/2015  . Essential hypertension 10/25/2015  . Multiple sclerosis ([place] 10/02/2015  . Chronic left shoulder pain 07/19/2015  . Multiple sclerosis exacerbation ([place] 07/14/2015  . MS (multiple sclerosis) ([place] 11/26/2014  . Increased body mass index 11/26/2014  . HPV test positive 11/26/2014  . Status post laparoscopic supracervical hysterectomy 11/26/2014  . Galactorrhea 11/26/2014  . Back ache 05/21/2014  . Adiposity 05/21/2014  . Disordered sleep 05/21/2014  . Muscle spasticity 05/21/2014  . Spasticity 05/21/2014  . Calculus of kidney 12/09/2013  . Renal colic 038/25/0539 . Hypercholesteremia 08/19/2013  . Hereditary and idiopathic neuropathy 08/19/2013  . Hypercholesterolemia without hypertriglyceridemia 08/19/2013  . Bladder infection, chronic 07/25/2012  . Disorder of bladder function 07/25/2012  . Incomplete bladder emptying 07/25/2012  . Microscopic hematuria 07/25/2012  . Right upper quadrant pain 07/25/2012   MJanna Arch PT, DPT   06/17/2020, 2:16 PM  [facility]MAIN [facility] [address]RGraford [place] [address]Phone: [phone]  Fax:  [phone] Name: JMaud RubendallRamseur MRN: [id number] of Birth: [birth year]

## 2020-06-22 ENCOUNTER — Ambulatory Visit: Payer: PPO

## 2020-06-24 ENCOUNTER — Ambulatory Visit: Payer: PPO

## 2020-06-29 ENCOUNTER — Other Ambulatory Visit: Payer: Self-pay

## 2020-06-29 ENCOUNTER — Ambulatory Visit: Payer: PPO

## 2020-06-29 DIAGNOSIS — R2681 Unsteadiness on feet: Secondary | ICD-10-CM

## 2020-06-29 DIAGNOSIS — R2689 Other abnormalities of gait and mobility: Secondary | ICD-10-CM

## 2020-06-29 DIAGNOSIS — G35 Multiple sclerosis: Secondary | ICD-10-CM

## 2020-06-29 DIAGNOSIS — M6281 Muscle weakness (generalized): Secondary | ICD-10-CM

## 2020-06-29 NOTE — Therapy (Signed)
[place] [address] [place], [place], [address] Phone: [phone]   Fax:  [phone]  Physical Therapy Treatment  Patient Details  Name: Lisa Williams MRN: [id number] Date of Birth: 1961-01-19 Referring Provider (PT): Gurney Maxin, MD   Encounter Date: 06/29/2020   PT End of Session - 06/29/20 1225    Visit Number 13    Number of Visits 91    Date for PT Re-Evaluation 08/24/20    Authorization Type 3/10 PN 2/3    PT Start Time 1015    PT Stop Time 1059    PT Time Calculation (min) 44 min    Equipment Utilized During Treatment Gait belt;Other (comment)   ice pack   Activity Tolerance Patient tolerated treatment well    Behavior During Therapy WFL for tasks assessed/performed           Past Medical History:  Diagnosis Date  . Abdominal pain, right upper quadrant   . Back pain   . Calculus of kidney 12/09/2013  . Chronic back pain    unspecified  . Chronic left shoulder pain 07/19/2015  . Complication of anesthesia   . Functional disorder of bladder    other  . Galactorrhea 11/26/2014   Chronic   . Hereditary and idiopathic neuropathy 08/19/2013  . HPV test positive   . Hypercholesteremia 08/19/2013  . Hypertension   . Incomplete bladder emptying   . Microscopic hematuria   . MS (multiple sclerosis) ([place])   . Muscle spasticity 05/21/2014  . Nonspecific findings on examination of urine    other  . Osteopenia   . PONV (postoperative nausea and vomiting)   . Status post laparoscopic supracervical hysterectomy 11/26/2014  . Tobacco user 11/26/2014  . Wrist fracture     Past Surgical History:  Procedure Laterality Date  . bilateral tubal ligation  1996  . BREAST CYST EXCISION Left 2002  . FRACTURE SURGERY    . KNEE SURGERY     right  . LAPAROSCOPIC SUPRACERVICAL HYSTERECTOMY  08/05/2013  . ORIF WRIST FRACTURE Left 01/17/2017   Procedure: OPEN REDUCTION INTERNAL FIXATION (ORIF) WRIST  FRACTURE;  Surgeon: Lovell Sheehan, MD;  Location: [facility];  Service: Orthopedics;  Laterality: Left;  . RADIOLOGY WITH ANESTHESIA N/A 03/18/2020   Procedure: MRI WITH ANESTHESIA CERVICAL SPINE AND BRAIN  WITH AND WITHOUT CONTRAST;  Surgeon: Radiologist, Medication, MD;  Location: [facility];  Service: Radiology;  Laterality: N/A;  . TUBAL LIGATION Bilateral   . VAGINAL HYSTERECTOMY  03/2006    There were no vitals filed for this visit.   Subjective Assessment - 06/29/20 1222    Subjective Patient returning after a week absense due to death in family and therapist being out sick. Has been having increasing upper trap pain limiting her mobility.    Pertinent History Patient is previous patient of this therapist, was admitted to the hospital on 08/03/19 for MS exacerbation and then went to inpatient rehab on 08/08/19. Patient then received home health therapy and now is returning to outpatient PT. PMh includes HTN, SVT, neurogenic bowel, MS (1995), neuropathy, hx of wrist fx, HPV, Hypercholesteremia, adiposity, osteopenia, spasticity. She wears bilateral AFOs and uses a RW and/or wheelchair for mobility. She drives an adapted car.    Limitations Lifting;Standing;Walking;House hold activities    How long can you sit comfortably? n/a    How long can you stand comfortably? 4 minutes    How long can you walk comfortably? walked 50  ft in inpatient rehab    Currently in Pain? Yes    Pain Score 7     Pain Location Shoulder    Pain Orientation Left    Pain Descriptors / Indicators Aching;Spasm    Pain Type Acute pain    Pain Radiating Towards upper trap    Pain Onset 1 to 4 weeks ago    Pain Frequency Constant             Seated:   Ultrasound:1.5 cm squared with 3 MHzx 8 minutes to L upper trap.   isometric contraction against PT resistance: -hip flexion 10x 3 second hold -hip abduction 10x 3 second hold -hamstring 10x 3 second holds each LE  RTB around knees: abduction 15x Squeezing ball  between knees, adduction 15x 3 second holds   Forward backwards trunk flexion/extension 15x Lateral trunk side bend 10x each side   STM to L cervical and upper trap musculature  x 11 minutes; implementation of effleurage and pettrisage as well as trigger point.  patient reports relief of spasm by end of session   Ice pack donned throughout session to minimize exacerbation of MS symptoms in order to perform interventions.    Pt educated throughout session about proper posture and technique with exercises. Improved exercise technique, movement at target joints, use of target muscles after min to mod verbal, visual, tactile cues.     Patient's session focused on pain reduction initially. Standing interventions not tolerated this session due to pain. Patient reports relief of majority of pain by end of session.  The patient would benefit from continued skilled PT intervention to maximize QOL, safety, and function                         PT Education - 06/29/20 1224    Education provided Yes    Education Details exercise technique, body mechanics,    Person(s) Educated Patient    Methods Explanation;Demonstration;Tactile cues;Verbal cues    Comprehension Verbalized understanding;Returned demonstration;Verbal cues required;Tactile cues required            PT Short Term Goals - 06/01/20 1611      PT SHORT TERM GOAL #1   Title Patient will be independent in home exercise program to improve strength/mobility for better functional independence with ADLs.    Baseline 5/19: HEP next session 6/22: HEP compliant 8/10 : HEP progressions compliant 8/31 HEP compliant 10/5: HEP compliant    Time 4    Period Weeks    Status Achieved    Target Date 01/13/20      PT SHORT TERM GOAL #2   Title Patient will perform a sit to stand with SUE support for increased ind. with transfers.    Baseline 5/19: heavy BUE support 6/22: requires one hand on walker one hand on w/c 8/10: able to  perform intermittently (some days can some days cannot) 8/31: attempt next session 10/5: BUE reliance, patient unable to complete 4 full stands. Attempt next session 1/25: able to perform from elevated surface only    Time 4    Period Weeks    Status Partially Met    Target Date 06/29/20             PT Long Term Goals - 06/03/20 0001      PT LONG TERM GOAL #1   Title Patient will increase FOTO score to equal to or greater than  53/100   to demonstrate statistically significant improvement  in mobility and quality of life.     Baseline 8/31: 48.13% 7/27:  48.1% 6/22: 46.99% 5/18: 48/100 10/5: 49/100 11/2: 48%  12/30: 46.9% 1/25: 46.99%    Time 12    Period Weeks    Status Partially Met    Target Date 08/24/20      PT LONG TERM GOAL #2   Title Patient (< 40 years old) will complete five times sit to stand test in < 20 seconds indicating an increased LE strength and improved balance.    Baseline 7/27: 26.8 seconds with one partial stand 6/22: 40. 86 sec; unable to reach full stand last two attempts 5/19: 42 seconds 10/5: BUE reliance, patient unable to complete 4 full stands. Attempt next session 11/2: 23.71 seconds 12/30: 30.32 with one near LOB due to spasms 1/25: 27.11 seconds with one hand on RW one hand on chair    Time 12    Period Weeks    Status Partially Met    Target Date 08/24/20      PT LONG TERM GOAL #3   Title Patient will increase 10 meter walk test to <20 seconds as to improve gait speed for better community ambulation and to reduce fall risk.    Baseline 8/31: 35.1 seconds with RW 7/27: 36 seconds with RW 6/22: 36.1 seconds 5/19: 42 seconds 10/5: 31.9 seconds with RW 11/2: 29.9 seconds with RW 12/30: deferred due to spasm 1/25: defer to next session 1/27: 29 seconds with RW    Time 12    Status Partially Met    Target Date 08/24/20      PT LONG TERM GOAL #4   Title Patient will reduce timed up and go to <11 seconds to reduce fall risk and demonstrate improved  transfer/gait ability.    Baseline 8/31: 50.8 seconds with RW 7/27: 43.65 6/22: 45.4 seconds 5/19: 47 seconds 10/5: 38.5 seconds with RW 11/2: 35.72 seconds with RW 12/30: 43.5 seconds with wheels wet. 1/25: deferred 1/27: 38.9 seconds    Time 12    Period Weeks    Status Partially Met    Target Date 08/24/20      PT LONG TERM GOAL #5   Title Patient will ambulate from physical therapy gym to elevator down the hall, ride elevator to first floor, and walk to valet with RW and chair follow for increased capacity for mobility and ind.    Baseline 8/31: able to open door ~50 ft with one rest break 8/10: unable to ambulate to elevator without rest break 9/30: able to ambulate to elevator with 3 rest breaks 1/25: 181 ft with 2 seated rest break (to elevator)    Time 12    Period Weeks    Status Partially Met    Target Date 08/24/20      PT LONG TERM GOAL #6   Title Patient will be able to stand with RW and lift one leg onto 4" step for carryover to negotiation of steps at home.     Baseline 11/2: unable to perform 1/25: R foot 0.75 inch lift    Time 12    Period Weeks    Status Partially Met    Target Date 08/24/20                 Plan - 06/29/20 1243    Clinical Impression Statement Patient's session focused on pain reduction initially. Standing interventions not tolerated this session due to pain. Patient reports relief of majority of pain  by end of session.  The patient would benefit from continued skilled PT intervention to maximize QOL, safety, and function    Personal Factors and Comorbidities Age;Comorbidity 3+;Fitness;Past/Current Experience;Sex;Time since onset of injury/illness/exacerbation    Comorbidities HTN, SVT, neurogenic bowel, MS (1995), neuropathy, hx of wrist fx, HPV, Hypercholesteremia, adiposity, osteopenia, spasticity.    Examination-Activity Limitations Bathing;Bed Mobility;Bend;Carry;Continence;Dressing;Hygiene/Grooming;Stairs;Squat;Reach Overhead;Locomotion  Level;Lift;Stand;Transfers    Examination-Participation Restrictions Church;Cleaning;Community Activity;Laundry;Volunteer;Shop;Meal Prep;Yard Work    [employer] Evolving/Moderate complexity    Rehab Potential Good    PT Frequency 2x / week    PT Duration 12 weeks    PT Treatment/Interventions ADLs/Self Care Home Management;Aquatic Therapy;Biofeedback;Cryotherapy;Electrical Stimulation;Iontophoresis 65m/ml Dexamethasone;Traction;Moist Heat;Ultrasound;DME Instruction;Gait training;Stair training;Functional mobility training;Therapeutic activities;Therapeutic exercise;Balance training;Neuromuscular re-education;Patient/family education;Orthotic Fit/Training;Wheelchair mobility training;Manual techniques;Compression bandaging;Passive range of motion;Dry needling;Energy conservation;Taping;Splinting;Vestibular    PT Next Visit Plan step negotiation, turning directions to the L and R    PT Home Exercise Plan maintain current [facility]P from [facility] [place] Agree with Plan of Care Patient           Patient will benefit from skilled therapeutic intervention in order to improve the following deficits and impairments:  Abnormal gait,Decreased activity tolerance,Decreased balance,Decreased coordination,Decreased endurance,Decreased mobility,Decreased range of motion,Decreased strength,Difficulty walking,Impaired perceived functional ability,Impaired sensation,Impaired tone,Impaired UE functional use,Improper body mechanics,Postural dysfunction,Cardiopulmonary status limiting activity,Impaired flexibility,Increased muscle spasms  Visit Diagnosis: Multiple sclerosis exacerbation (H[facility])  Muscle weakness (generalized)  Other abnormalities of gait and mobility  Unsteadiness on feet     Problem List Patient Active Problem List   Diagnosis Date Noted  . Hypoalbuminemia due to protein-calorie malnutrition ([place]   . Neurogenic bowel   . Neurogenic bladder   . Labile blood  pressure   . Neuropathic pain   . Abscess of female pelvis   . SVT (supraventricular tachycardia) ([facility]   . Radial styloid tenosynovitis 03/12/2018  . Wheelchair confinement 02/27/2018  . Localized osteoporosis with current pathological fracture with routine healing 01/19/2017  . Wrist fracture 01/16/2017  . Sprain of ankle 03/23/2016  . Closed fracture of lateral malleolus 03/16/2016  . Health care maintenance 01/24/2016  . Blood pressure elevated without history of HTN 10/25/2015  . Essential hypertension 10/25/2015  . Multiple sclerosis ([place] 10/02/2015  . Chronic left shoulder pain 07/19/2015  . Multiple sclerosis exacerbation ([place] 07/14/2015  . MS (multiple sclerosis) ([place] 11/26/2014  . Increased body mass index 11/26/2014  . HPV test positive 11/26/2014  . Status post laparoscopic supracervical hysterectomy 11/26/2014  . Galactorrhea 11/26/2014  . Back ache 05/21/2014  . Adiposity 05/21/2014  . Disordered sleep 05/21/2014  . Muscle spasticity 05/21/2014  . Spasticity 05/21/2014  . Calculus of kidney 12/09/2013  . Renal colic 092/92/4462 . Hypercholesteremia 08/19/2013  . Hereditary and idiopathic neuropathy 08/19/2013  . Hypercholesterolemia without hypertriglyceridemia 08/19/2013  . Bladder infection, chronic 07/25/2012  . Disorder of bladder function 07/25/2012  . Incomplete bladder emptying 07/25/2012  . Microscopic hematuria 07/25/2012  . Right upper quadrant pain 07/25/2012   MJanna Arch PT, DPT   06/29/2020, 12:45 PM  [facility] LLCSERVICES [address]RGonvick [place] [address]Phone: [phone]  Fax:  [phone] Name: Lisa Williams MRN: [id number] of Birth: [birth year]

## 2020-07-01 ENCOUNTER — Ambulatory Visit: Payer: PPO

## 2020-07-01 ENCOUNTER — Other Ambulatory Visit: Payer: Self-pay

## 2020-07-01 DIAGNOSIS — R2681 Unsteadiness on feet: Secondary | ICD-10-CM

## 2020-07-01 DIAGNOSIS — R2689 Other abnormalities of gait and mobility: Secondary | ICD-10-CM

## 2020-07-01 DIAGNOSIS — M6281 Muscle weakness (generalized): Secondary | ICD-10-CM

## 2020-07-01 DIAGNOSIS — G35 Multiple sclerosis: Secondary | ICD-10-CM | POA: Diagnosis not present

## 2020-07-01 NOTE — Therapy (Signed)
[place] [facility] [address] [place], [place], [address] Phone: [phone]   Fax:  [phone]  Physical Therapy Treatment  Patient Details  Name: Lisa Williams MRN: [id number] Date of Birth: [birth year] Referring Provider (PT): Gurney Maxin, MD   Encounter Date: 07/01/2020   PT End of Session - 07/01/20 1303    Visit Number 37    Number of Visits 91    Date for PT Re-Evaluation 08/24/20    Authorization Type 4/10 PN 2/3    PT Start Time 1015    PT Stop Time 1101    PT Time Calculation (min) 46 min    Equipment Utilized During Treatment Gait belt;Other (comment)   ice pack   Activity Tolerance Patient tolerated treatment well    Behavior During Therapy WFL for tasks assessed/performed           Past Medical History:  Diagnosis Date  . Abdominal pain, right upper quadrant   . Back pain   . Calculus of kidney 12/09/2013  . Chronic back pain    unspecified  . Chronic left shoulder pain 07/19/2015  . Complication of anesthesia   . Functional disorder of bladder    other  . Galactorrhea 11/26/2014   Chronic   . Hereditary and idiopathic neuropathy 08/19/2013  . HPV test positive   . Hypercholesteremia 08/19/2013  . Hypertension   . Incomplete bladder emptying   . Microscopic hematuria   . MS (multiple sclerosis) ([facility])   . Muscle spasticity 05/21/2014  . Nonspecific findings on examination of urine    other  . Osteopenia   . PONV (postoperative nausea and vomiting)   . Status post laparoscopic supracervical hysterectomy 11/26/2014  . Tobacco user 11/26/2014  . Wrist fracture     Past Surgical History:  Procedure Laterality Date  . bilateral tubal ligation  1996  . BREAST CYST EXCISION Left 2002  . FRACTURE SURGERY    . KNEE SURGERY     right  . LAPAROSCOPIC SUPRACERVICAL HYSTERECTOMY  08/05/2013  . ORIF WRIST FRACTURE Left 01/17/2017   Procedure: OPEN REDUCTION INTERNAL FIXATION (ORIF) WRIST  FRACTURE;  Surgeon: Lovell Sheehan, MD;  Location: [facility];  Service: Orthopedics;  Laterality: Left;  . RADIOLOGY WITH ANESTHESIA N/A 03/18/2020   Procedure: MRI WITH ANESTHESIA CERVICAL SPINE AND BRAIN  WITH AND WITHOUT CONTRAST;  Surgeon: Radiologist, Medication, MD;  Location: [facility];  Service: Radiology;  Laterality: N/A;  . TUBAL LIGATION Bilateral   . VAGINAL HYSTERECTOMY  03/2006    There were no vitals filed for this visit.   Subjective Assessment - 07/01/20 1249    Subjective Patient reports compliance with HEP. shoulder is improving but continues to be a limiting factor for patient at this time. No falls or LOB since last session    Pertinent History Patient is previous patient of this therapist, was admitted to the hospital on 08/03/19 for MS exacerbation and then went to inpatient rehab on 08/08/19. Patient then received home health therapy and now is returning to outpatient PT. PMh includes HTN, SVT, neurogenic bowel, MS (1995), neuropathy, hx of wrist fx, HPV, Hypercholesteremia, adiposity, osteopenia, spasticity. She wears bilateral AFOs and uses a RW and/or wheelchair for mobility. She drives an adapted car.    Limitations Lifting;Standing;Walking;House hold activities    How long can you sit comfortably? n/a    How long can you stand comfortably? 4 minutes    How long can you walk comfortably? walked  50 ft in inpatient rehab    Currently in Pain? Yes    Pain Score 5     Pain Location Shoulder    Pain Orientation Left    Pain Descriptors / Indicators Aching    Pain Type Acute pain    Pain Onset 1 to 4 weeks ago    Pain Frequency Constant                  Gait: Patient ambulates 75f, 2 seated rest breaks CGA and wheelchair follow for safety. Patient able to differentiate knee and hip flexion/extension allowing for a more neutral gait pattern. Patient demonstrates inc reliance on BUE by end of gait trial. Terminated by therapist due to increased upper trap flexion and  pain.     Seated:  Ultrasound:1.5 cm squared with 3 MHzx 8 minutes to L upper trap.   isometric contraction against PT resistance: -hip flexion 10x 3 second hold -hip abduction 10x 3 second hold -hamstring 10x 3 second holds each LE  RTB around knees: abduction 15x Squeezing ball between knees, adduction 15x 3 second holds  Forward backwards trunk flexion/extension 15x Lateral trunk side bend 10x each side   Ice pack donned throughout session to minimize exacerbation of MS symptoms in order to perform interventions.    Pt educated throughout session about proper posture and technique with exercises. Improved exercise technique, movement at target joints, use of target muscles after min to mod verbal, visual, tactile cues.       Patient's upper trap pain continues to be main limitation of ambulation thi session. She remains highly motivated with therapist being the one to stop ambulation due to threat of further injury to patient. Patient reports decreased pain by end of session after ultrasound. The patient would benefit from continued skilled PT intervention to maximize QOL, safety, and function                      PT Education - 07/01/20 1302    Education provided Yes    Education Details exercise technique, body mechanics    Person(s) Educated Patient    Methods Explanation;Demonstration;Tactile cues;Verbal cues    Comprehension Verbalized understanding;Returned demonstration;Verbal cues required;Tactile cues required            PT Short Term Goals - 06/01/20 1611      PT SHORT TERM GOAL #1   Title Patient will be independent in home exercise program to improve strength/mobility for better functional independence with ADLs.    Baseline 5/19: HEP next session 6/22: HEP compliant 8/10 : HEP progressions compliant 8/31 HEP compliant 10/5: HEP compliant    Time 4    Period Weeks    Status Achieved    Target Date 01/13/20      PT SHORT TERM GOAL #2    Title Patient will perform a sit to stand with SUE support for increased ind. with transfers.    Baseline 5/19: heavy BUE support 6/22: requires one hand on walker one hand on w/c 8/10: able to perform intermittently (some days can some days cannot) 8/31: attempt next session 10/5: BUE reliance, patient unable to complete 4 full stands. Attempt next session 1/25: able to perform from elevated surface only    Time 4    Period Weeks    Status Partially Met    Target Date 06/29/20             PT Long Term Goals - 06/03/20 0001  PT LONG TERM GOAL #1   Title Patient will increase FOTO score to equal to or greater than  53/100   to demonstrate statistically significant improvement in mobility and quality of life.     Baseline 8/31: 48.13% 7/27:  48.1% 6/22: 46.99% 5/18: 48/100 10/5: 49/100 11/2: 48%  12/30: 46.9% 1/25: 46.99%    Time 12    Period Weeks    Status Partially Met    Target Date 08/24/20      PT LONG TERM GOAL #2   Title Patient (< 50 years old) will complete five times sit to stand test in < 20 seconds indicating an increased LE strength and improved balance.    Baseline 7/27: 26.8 seconds with one partial stand 6/22: 40. 86 sec; unable to reach full stand last two attempts 5/19: 42 seconds 10/5: BUE reliance, patient unable to complete 4 full stands. Attempt next session 11/2: 23.71 seconds 12/30: 30.32 with one near LOB due to spasms 1/25: 27.11 seconds with one hand on RW one hand on chair    Time 12    Period Weeks    Status Partially Met    Target Date 08/24/20      PT LONG TERM GOAL #3   Title Patient will increase 10 meter walk test to <20 seconds as to improve gait speed for better community ambulation and to reduce fall risk.    Baseline 8/31: 35.1 seconds with RW 7/27: 36 seconds with RW 6/22: 36.1 seconds 5/19: 42 seconds 10/5: 31.9 seconds with RW 11/2: 29.9 seconds with RW 12/30: deferred due to spasm 1/25: defer to next session 1/27: 29 seconds with RW     Time 12    Status Partially Met    Target Date 08/24/20      PT LONG TERM GOAL #4   Title Patient will reduce timed up and go to <11 seconds to reduce fall risk and demonstrate improved transfer/gait ability.    Baseline 8/31: 50.8 seconds with RW 7/27: 43.65 6/22: 45.4 seconds 5/19: 47 seconds 10/5: 38.5 seconds with RW 11/2: 35.72 seconds with RW 12/30: 43.5 seconds with wheels wet. 1/25: deferred 1/27: 38.9 seconds    Time 12    Period Weeks    Status Partially Met    Target Date 08/24/20      PT LONG TERM GOAL #5   Title Patient will ambulate from physical therapy gym to elevator down the hall, ride elevator to first floor, and walk to valet with RW and chair follow for increased capacity for mobility and ind.    Baseline 8/31: able to open door ~50 ft with one rest break 8/10: unable to ambulate to elevator without rest break 9/30: able to ambulate to elevator with 3 rest breaks 1/25: 181 ft with 2 seated rest break (to elevator)    Time 12    Period Weeks    Status Partially Met    Target Date 08/24/20      PT LONG TERM GOAL #6   Title Patient will be able to stand with RW and lift one leg onto 4" step for carryover to negotiation of steps at home.     Baseline 11/2: unable to perform 1/25: R foot 0.75 inch lift    Time 12    Period Weeks    Status Partially Met    Target Date 08/24/20                 Plan - 07/01/20 1314  Clinical Impression Statement Patient's upper trap pain continues to be main limitation of ambulation thi session. She remains highly motivated with therapist being the one to stop ambulation due to threat of further injury to patient. Patient reports decreased pain by end of session after ultrasound. The patient would benefit from continued skilled PT intervention to maximize QOL, safety, and function    Personal Factors and Comorbidities Age;Comorbidity 3+;Fitness;Past/Current Experience;Sex;Time since onset of injury/illness/exacerbation     Comorbidities HTN, SVT, neurogenic bowel, MS (1995), neuropathy, hx of wrist fx, HPV, Hypercholesteremia, adiposity, osteopenia, spasticity.    Examination-Activity Limitations Bathing;Bed Mobility;Bend;Carry;Continence;Dressing;Hygiene/Grooming;Stairs;Squat;Reach Overhead;Locomotion Level;Lift;Stand;Transfers    Examination-Participation Restrictions Church;Cleaning;Community Activity;Laundry;Volunteer;Shop;Meal Prep;Yard Work    [employer] Evolving/Moderate complexity    Rehab Potential Good    PT Frequency 2x / week    PT Duration 12 weeks    PT Treatment/Interventions ADLs/Self Care Home Management;Aquatic Therapy;Biofeedback;Cryotherapy;Electrical Stimulation;Iontophoresis 24m/ml Dexamethasone;Traction;Moist Heat;Ultrasound;DME Instruction;Gait training;Stair training;Functional mobility training;Therapeutic activities;Therapeutic exercise;Balance training;Neuromuscular re-education;Patient/family education;Orthotic Fit/Training;Wheelchair mobility training;Manual techniques;Compression bandaging;Passive range of motion;Dry needling;Energy conservation;Taping;Splinting;Vestibular    PT Next Visit Plan step negotiation, turning directions to the L and R    PT Home Exercise Plan [place] Agree with Plan of Care Patient           Patient will benefit from skilled therapeutic intervention in order to improve the following deficits and impairments:  Abnormal gait,Decreased activity tolerance,Decreased balance,Decreased coordination,Decreased endurance,Decreased mobility,Decreased range of motion,Decreased strength,Difficulty walking,Impaired perceived functional ability,Impaired sensation,Impaired tone,Impaired UE functional use,Improper body mechanics,Postural dysfunction,Cardiopulmonary status limiting activity,Impaired flexibility,Increased muscle spasms  Visit Diagnosis: Multiple sclerosis exacerbation ([facility])  Muscle weakness  (generalized)  Other abnormalities of gait and mobility  Unsteadiness on feet     Problem List Patient Active Problem List   Diagnosis Date Noted  . Hypoalbuminemia due to protein-calorie malnutrition ([facility]   . Neurogenic bowel   . Neurogenic bladder   . Labile blood pressure   . Neuropathic pain   . Abscess of female pelvis   . SVT (supraventricular tachycardia) ([facility]   . Radial styloid tenosynovitis 03/12/2018  . Wheelchair confinement 02/27/2018  . Localized osteoporosis with current pathological fracture with routine healing 01/19/2017  . Wrist fracture 01/16/2017  . Sprain of ankle 03/23/2016  . Closed fracture of lateral malleolus 03/16/2016  . Health care maintenance 01/24/2016  . Blood pressure elevated without history of HTN 10/25/2015  . Essential hypertension 10/25/2015  . Multiple sclerosis ([facility] 10/02/2015  . Chronic left shoulder pain 07/19/2015  . Multiple sclerosis exacerbation ([facility] 07/14/2015  . MS (multiple sclerosis) ([place] 11/26/2014  . Increased body mass index 11/26/2014  . HPV test positive 11/26/2014  . Status post laparoscopic supracervical hysterectomy 11/26/2014  . Galactorrhea 11/26/2014  . Back ache 05/21/2014  . Adiposity 05/21/2014  . Disordered sleep 05/21/2014  . Muscle spasticity 05/21/2014  . Spasticity 05/21/2014  . Calculus of kidney 12/09/2013  . Renal colic 078/93/8101 . Hypercholesteremia 08/19/2013  . Hereditary and idiopathic neuropathy 08/19/2013  . Hypercholesterolemia without hypertriglyceridemia 08/19/2013  . Bladder infection, chronic 07/25/2012  . Disorder of bladder function 07/25/2012  . Incomplete bladder emptying 07/25/2012  . Microscopic hematuria 07/25/2012  . Right upper quadrant pain 07/25/2012   MJanna Arch PT, DPT   07/01/2020, 1:16 PM  [facility] [address] [address]Phone: [phone]  Fax:  [phone] Name:  Lisa RylanderRamseur MRN: [id number] of Birth: 605/28/1962

## 2020-07-06 ENCOUNTER — Other Ambulatory Visit: Payer: Self-pay

## 2020-07-06 ENCOUNTER — Ambulatory Visit: Payer: PPO | Attending: Neurology

## 2020-07-06 DIAGNOSIS — R2681 Unsteadiness on feet: Secondary | ICD-10-CM | POA: Diagnosis not present

## 2020-07-06 DIAGNOSIS — G35 Multiple sclerosis: Secondary | ICD-10-CM | POA: Diagnosis not present

## 2020-07-06 DIAGNOSIS — R2689 Other abnormalities of gait and mobility: Secondary | ICD-10-CM | POA: Insufficient documentation

## 2020-07-06 DIAGNOSIS — M6281 Muscle weakness (generalized): Secondary | ICD-10-CM | POA: Insufficient documentation

## 2020-07-06 NOTE — Therapy (Signed)
[facility] SERVICES [address] [place], [place], [address] Phone: [phone]   Fax:  [phone]  Physical Therapy Treatment  Patient Details  Name: Lisa Williams Linsey MRN: [id number] Date of Birth: 1960/12/25 Referring Provider (PT): Gurney Maxin, MD   Encounter Date: 07/06/2020   PT End of Session - 07/06/20 1333    Visit Number 34    Number of Visits 91    Date for PT Re-Evaluation 08/24/20    Authorization Type 5/10 PN 2/3    PT Start Time 1015    PT Stop Time 1059    PT Time Calculation (min) 44 min    Equipment Utilized During Treatment Gait belt;Other (comment)   ice pack   Activity Tolerance Patient tolerated treatment well    Behavior During Therapy WFL for tasks assessed/performed           Past Medical History:  Diagnosis Date  . Abdominal pain, right upper quadrant   . Back pain   . Calculus of kidney 12/09/2013  . Chronic back pain    unspecified  . Chronic left shoulder pain 07/19/2015  . Complication of anesthesia   . Functional disorder of bladder    other  . Galactorrhea 11/26/2014   Chronic   . Hereditary and idiopathic neuropathy 08/19/2013  . HPV test positive   . Hypercholesteremia 08/19/2013  . Hypertension   . Incomplete bladder emptying   . Microscopic hematuria   . MS (multiple sclerosis) ([place])   . Muscle spasticity 05/21/2014  . Nonspecific findings on examination of urine    other  . Osteopenia   . PONV (postoperative nausea and vomiting)   . Status post laparoscopic supracervical hysterectomy 11/26/2014  . Tobacco user 11/26/2014  . Wrist fracture     Past Surgical History:  Procedure Laterality Date  . bilateral tubal ligation  1996  . BREAST CYST EXCISION Left 2002  . FRACTURE SURGERY    . KNEE SURGERY     right  . LAPAROSCOPIC SUPRACERVICAL HYSTERECTOMY  08/05/2013  . ORIF WRIST FRACTURE Left 01/17/2017   Procedure: OPEN REDUCTION INTERNAL FIXATION (ORIF) WRIST FRACTURE;   Surgeon: Lovell Sheehan, MD;  Location: [facility];  Service: Orthopedics;  Laterality: Left;  . RADIOLOGY WITH ANESTHESIA N/A 03/18/2020   Procedure: MRI WITH ANESTHESIA CERVICAL SPINE AND BRAIN  WITH AND WITHOUT CONTRAST;  Surgeon: Radiologist, Medication, MD;  Location: [facility];  Service: Radiology;  Laterality: N/A;  . TUBAL LIGATION Bilateral   . VAGINAL HYSTERECTOMY  03/2006    There were no vitals filed for this visit.   Subjective Assessment - 07/06/20 1331    Subjective Patient reports feeling very stiff right now. Has been busy with family matters since last session. No falls or LOB.    Pertinent History Patient is previous patient of this therapist, was admitted to the hospital on 08/03/19 for MS exacerbation and then went to inpatient rehab on 08/08/19. Patient then received home health therapy and now is returning to outpatient PT. PMh includes HTN, SVT, neurogenic bowel, MS (1995), neuropathy, hx of wrist fx, HPV, Hypercholesteremia, adiposity, osteopenia, spasticity. She wears bilateral AFOs and uses a RW and/or wheelchair for mobility. She drives an adapted car.    Limitations Lifting;Standing;Walking;House hold activities    How long can you sit comfortably? n/a    How long can you stand comfortably? 4 minutes    How long can you walk comfortably? walked 50 ft in inpatient rehab  Currently in Pain? No/denies                 Standing in // bars:  -Standing weight shifts 2x10 each side; UE support focus on hip shift rather than pull with arm -Modified tandem stance hold 30 seconds x 2 trials each LE placement  -hip flexion into GTB across // bars 10x each LE no rest break -modified squat 3x, terminated due to fatigue.      Seated:    Lateral reach to pick up cone on 6" step  Bend over and reach, pick up cone and pass to opposite side (4 cones, and pt reset cones after picking up all cones) x 4 trials each side Hamstring stretch on PT leg 2x30 seconds; x 2 sets  RTB  abduction 15x Adduction ball squeeze 15x         Ice pack donned throughout session to minimize exacerbation of MS symptoms in order to perform interventions.      Pt educated throughout session about proper posture and technique with exercises. Improved exercise technique, movement at target joints, use of target muscles after min to mod verbal, visual, tactile cues  Patient requires more frequent rest breaks this session due to fatigue and increased feeling of "stiffness". Patient educated on importance of not pushing self to point of exacerbation. Patient is improving in control of muscle recruitment with ability to flex hip and knee unilaterally with increased fluidity of recruitment. The patient would benefit from continued skilled PT intervention to maximize QOL, safety, and function                     PT Education - 07/06/20 1332    Education provided Yes    Education Details exercise technique, body mechanics    Person(s) Educated Patient    Methods Explanation;Demonstration;Verbal cues;Tactile cues    Comprehension Verbalized understanding;Returned demonstration;Verbal cues required;Tactile cues required            PT Short Term Goals - 06/01/20 1611      PT SHORT TERM GOAL #1   Title Patient will be independent in home exercise program to improve strength/mobility for better functional independence with ADLs.    Baseline 5/19: HEP next session 6/22: HEP compliant 8/10 : HEP progressions compliant 8/31 HEP compliant 10/5: HEP compliant    Time 4    Period Weeks    Status Achieved    Target Date 01/13/20      PT SHORT TERM GOAL #2   Title Patient will perform a sit to stand with SUE support for increased ind. with transfers.    Baseline 5/19: heavy BUE support 6/22: requires one hand on walker one hand on w/c 8/10: able to perform intermittently (some days can some days cannot) 8/31: attempt next session 10/5: BUE reliance, patient unable to complete 4  full stands. Attempt next session 1/25: able to perform from elevated surface only    Time 4    Period Weeks    Status Partially Met    Target Date 06/29/20             PT Long Term Goals - 06/03/20 0001      PT LONG TERM GOAL #1   Title Patient will increase FOTO score to equal to or greater than  53/100   to demonstrate statistically significant improvement in mobility and quality of life.     Baseline 8/31: 48.13% 7/27:  48.1% 6/22: 46.99% 5/18: 48/100 10/5: 49/100 11/2:  48%  12/30: 46.9% 1/25: 46.99%    Time 12    Period Weeks    Status Partially Met    Target Date 08/24/20      PT LONG TERM GOAL #2   Title Patient (< 80 years old) will complete five times sit to stand test in < 20 seconds indicating an increased LE strength and improved balance.    Baseline 7/27: 26.8 seconds with one partial stand 6/22: 40. 86 sec; unable to reach full stand last two attempts 5/19: 42 seconds 10/5: BUE reliance, patient unable to complete 4 full stands. Attempt next session 11/2: 23.71 seconds 12/30: 30.32 with one near LOB due to spasms 1/25: 27.11 seconds with one hand on RW one hand on chair    Time 12    Period Weeks    Status Partially Met    Target Date 08/24/20      PT LONG TERM GOAL #3   Title Patient will increase 10 meter walk test to <20 seconds as to improve gait speed for better community ambulation and to reduce fall risk.    Baseline 8/31: 35.1 seconds with RW 7/27: 36 seconds with RW 6/22: 36.1 seconds 5/19: 42 seconds 10/5: 31.9 seconds with RW 11/2: 29.9 seconds with RW 12/30: deferred due to spasm 1/25: defer to next session 1/27: 29 seconds with RW    Time 12    Status Partially Met    Target Date 08/24/20      PT LONG TERM GOAL #4   Title Patient will reduce timed up and go to <11 seconds to reduce fall risk and demonstrate improved transfer/gait ability.    Baseline 8/31: 50.8 seconds with RW 7/27: 43.65 6/22: 45.4 seconds 5/19: 47 seconds 10/5: 38.5 seconds with RW  11/2: 35.72 seconds with RW 12/30: 43.5 seconds with wheels wet. 1/25: deferred 1/27: 38.9 seconds    Time 12    Period Weeks    Status Partially Met    Target Date 08/24/20      PT LONG TERM GOAL #5   Title Patient will ambulate from physical therapy gym to elevator down the hall, ride elevator to first floor, and walk to valet with RW and chair follow for increased capacity for mobility and ind.    Baseline 8/31: able to open door ~50 ft with one rest break 8/10: unable to ambulate to elevator without rest break 9/30: able to ambulate to elevator with 3 rest breaks 1/25: 181 ft with 2 seated rest break (to elevator)    Time 12    Period Weeks    Status Partially Met    Target Date 08/24/20      PT LONG TERM GOAL #6   Title Patient will be able to stand with RW and lift one leg onto 4" step for carryover to negotiation of steps at home.     Baseline 11/2: unable to perform 1/25: R foot 0.75 inch lift    Time 12    Period Weeks    Status Partially Met    Target Date 08/24/20                 Plan - 07/06/20 1333    Clinical Impression Statement Patient requires more frequent rest breaks this session due to fatigue and increased feeling of "stiffness". Patient educated on importance of not pushing self to point of exacerbation. Patient is improving in control of muscle recruitment with ability to flex hip and knee unilaterally with increased  fluidity of recruitment. The patient would benefit from continued skilled PT intervention to maximize QOL, safety, and function    Personal Factors and Comorbidities Age;Comorbidity 3+;Fitness;Past/Current Experience;Sex;Time since onset of injury/illness/exacerbation    Comorbidities HTN, SVT, neurogenic bowel, MS (1995), neuropathy, hx of wrist fx, HPV, Hypercholesteremia, adiposity, osteopenia, spasticity.    Examination-Activity Limitations Bathing;Bed Mobility;Bend;Carry;Continence;Dressing;Hygiene/Grooming;Stairs;Squat;Reach  Overhead;Locomotion Level;Lift;Stand;Transfers    Examination-Participation Restrictions Church;Cleaning;Community Activity;Laundry;Volunteer;Shop;Meal Prep;Yard Work    [employer] Evolving/Moderate complexity    Rehab Potential Good    PT Frequency 2x / week    PT Duration 12 weeks    PT Treatment/Interventions ADLs/Self Care Home Management;Aquatic Therapy;Biofeedback;Cryotherapy;Electrical Stimulation;Iontophoresis 53m/ml Dexamethasone;Traction;Moist Heat;Ultrasound;DME Instruction;Gait training;Stair training;Functional mobility training;Therapeutic activities;Therapeutic exercise;Balance training;Neuromuscular re-education;Patient/family education;Orthotic Fit/Training;Wheelchair mobility training;Manual techniques;Compression bandaging;Passive range of motion;Dry needling;Energy conservation;Taping;Splinting;Vestibular    PT Next Visit Plan step negotiation, turning directions to the L and R    PT Home Exercise Plan maintain current HEP from [place] Agree with Plan of Care Patient           Patient will benefit from skilled therapeutic intervention in order to improve the following deficits and impairments:  Abnormal gait,Decreased activity tolerance,Decreased balance,Decreased coordination,Decreased endurance,Decreased mobility,Decreased range of motion,Decreased strength,Difficulty walking,Impaired perceived functional ability,Impaired sensation,Impaired tone,Impaired UE functional use,Improper body mechanics,Postural dysfunction,Cardiopulmonary status limiting activity,Impaired flexibility,Increased muscle spasms  Visit Diagnosis: Multiple sclerosis exacerbation ([facility])  Muscle weakness (generalized)  Other abnormalities of gait and mobility  Unsteadiness on feet     Problem List Patient Active Problem List   Diagnosis Date Noted  . Hypoalbuminemia due to protein-calorie malnutrition ([place]   . Neurogenic bowel   . Neurogenic bladder   .  Labile blood pressure   . Neuropathic pain   . Abscess of female pelvis   . SVT (supraventricular tachycardia) ([place]   . Radial styloid tenosynovitis 03/12/2018  . Wheelchair confinement 02/27/2018  . Localized osteoporosis with current pathological fracture with routine healing 01/19/2017  . Wrist fracture 01/16/2017  . Sprain of ankle 03/23/2016  . Closed fracture of lateral malleolus 03/16/2016  . Health care maintenance 01/24/2016  . Blood pressure elevated without history of HTN 10/25/2015  . Essential hypertension 10/25/2015  . Multiple sclerosis ([place] 10/02/2015  . Chronic left shoulder pain 07/19/2015  . Multiple sclerosis exacerbation ([place] 07/14/2015  . MS (multiple sclerosis) ([place] 11/26/2014  . Increased body mass index 11/26/2014  . HPV test positive 11/26/2014  . Status post laparoscopic supracervical hysterectomy 11/26/2014  . Galactorrhea 11/26/2014  . Back ache 05/21/2014  . Adiposity 05/21/2014  . Disordered sleep 05/21/2014  . Muscle spasticity 05/21/2014  . Spasticity 05/21/2014  . Calculus of kidney 12/09/2013  . Renal colic 067/20/9470 . Hypercholesteremia 08/19/2013  . Hereditary and idiopathic neuropathy 08/19/2013  . Hypercholesterolemia without hypertriglyceridemia 08/19/2013  . Bladder infection, chronic 07/25/2012  . Disorder of bladder function 07/25/2012  . Incomplete bladder emptying 07/25/2012  . Microscopic hematuria 07/25/2012  . Right upper quadrant pain 07/25/2012   MJanna Arch PT, DPT   07/06/2020, 1:34 PM  CA[facility]MAIN [facility] IncSERVICES [address] [address]Phone: [phone]  Fax:  [phone] Name: JSyanne LooneyRamseur MRN: [id number] of Birth: [birth year]

## 2020-07-08 ENCOUNTER — Ambulatory Visit: Payer: PPO

## 2020-07-08 ENCOUNTER — Other Ambulatory Visit: Payer: Self-pay

## 2020-07-08 DIAGNOSIS — R2681 Unsteadiness on feet: Secondary | ICD-10-CM

## 2020-07-08 DIAGNOSIS — G35 Multiple sclerosis: Secondary | ICD-10-CM

## 2020-07-08 DIAGNOSIS — M6281 Muscle weakness (generalized): Secondary | ICD-10-CM

## 2020-07-08 DIAGNOSIS — R2689 Other abnormalities of gait and mobility: Secondary | ICD-10-CM

## 2020-07-08 NOTE — Therapy (Signed)
[place] [facility] SERVICES [place], [place], [address] Phone: [phone]   Fax:  [phone]  Physical Therapy Treatment  Patient Details  Name: Lisa Williams MRN: [id number] Date of Birth: [birth year] Referring Provider (PT): Gurney Maxin, MD   Encounter Date: 07/08/2020   PT End of Session - 07/08/20 1050    Visit Number 17    Number of Visits 91    Date for PT Re-Evaluation 08/24/20    Authorization Type 6/10 PN 2/3    PT Start Time 1016    PT Stop Time 1100    PT Time Calculation (min) 44 min    Equipment Utilized During Treatment Gait belt;Other (comment)   ice pack   Activity Tolerance Patient tolerated treatment well    Behavior During Therapy WFL for tasks assessed/performed           Past Medical History:  Diagnosis Date  . Abdominal pain, right upper quadrant   . Back pain   . Calculus of kidney 12/09/2013  . Chronic back pain    unspecified  . Chronic left shoulder pain 07/19/2015  . Complication of anesthesia   . Functional disorder of bladder    other  . Galactorrhea 11/26/2014   Chronic   . Hereditary and idiopathic neuropathy 08/19/2013  . HPV test positive   . Hypercholesteremia 08/19/2013  . Hypertension   . Incomplete bladder emptying   . Microscopic hematuria   . MS (multiple sclerosis) ([place])   . Muscle spasticity 05/21/2014  . Nonspecific findings on examination of urine    other  . Osteopenia   . PONV (postoperative nausea and vomiting)   . Status post laparoscopic supracervical hysterectomy 11/26/2014  . Tobacco user 11/26/2014  . Wrist fracture     Past Surgical History:  Procedure Laterality Date  . bilateral tubal ligation  1996  . BREAST CYST EXCISION Left 2002  . FRACTURE SURGERY    . KNEE SURGERY     right  . LAPAROSCOPIC SUPRACERVICAL HYSTERECTOMY  08/05/2013  . ORIF WRIST FRACTURE Left 01/17/2017   Procedure: OPEN REDUCTION INTERNAL FIXATION (ORIF) WRIST FRACTURE;   Surgeon: Lovell Sheehan, MD;  Location: [facility];  Service: Orthopedics;  Laterality: Left;  . RADIOLOGY WITH ANESTHESIA N/A 03/18/2020   Procedure: MRI WITH ANESTHESIA CERVICAL SPINE AND BRAIN  WITH AND WITHOUT CONTRAST;  Surgeon: Radiologist, Medication, MD;  Location: [facility];  Service: Radiology;  Laterality: N/A;  . TUBAL LIGATION Bilateral   . VAGINAL HYSTERECTOMY  03/2006    There were no vitals filed for this visit.   Subjective Assessment - 07/08/20 1048    Subjective Pt reports stiffness is better. No changes since last session. No LOB or LOB.    Pertinent History Patient is previous patient of this therapist, was admitted to the hospital on 08/03/19 for MS exacerbation and then went to inpatient rehab on 08/08/19. Patient then received home health therapy and now is returning to outpatient PT. PMh includes HTN, SVT, neurogenic bowel, MS (1995), neuropathy, hx of wrist fx, HPV, Hypercholesteremia, adiposity, osteopenia, spasticity. She wears bilateral AFOs and uses a RW and/or wheelchair for mobility. She drives an adapted car.    Limitations Lifting;Standing;Walking;House hold activities    How long can you sit comfortably? n/a    How long can you stand comfortably? 4 minutes    How long can you walk comfortably? walked 50 ft in inpatient rehab    Patient Stated Goals return to  PLOF, increase walking, get into shower, increase mobility    Currently in Pain? No/denies                  Gait: Patient ambulates 2102f, 2 seated rest breaks CGA and wheelchair follow for safety. Patient able to navigate opening doors, turns, and thresholds independently but with inc time to perform. Patient demonstrates inc reliance on BUE by end of gait trial.    Seated:   isometric contraction against PT resistance: -hip flexion 10x 3 second hold -hip abduction 10x 3 second hold -hip adduction 10x 3 second hold    Ice pack donned throughout session to minimize exacerbation of MS symptoms in  order to perform interventions.    Pt educated throughout session about proper posture and technique with exercises. Improved exercise technique, movement at target joints, use of target muscles after min to mod verbal, visual, tactile cues.       Patient is highly motivated and is progressing with functional ambulation capacity and mobility with decreased rest breaks. She was very fatigued by end of session requiirng ice pack and cues for breathing. Isometric contractions continue to improve with strength and increased resistance applied by PT. The patient would benefit from continued skilled PT intervention to maximize QOL, safety, and function                        PT Education - 07/08/20 1050    Education provided Yes    Education Details exercise technique, body mechanics    Person(s) Educated Patient    Methods Explanation;Demonstration;Tactile cues;Verbal cues    Comprehension Verbalized understanding;Returned demonstration;Verbal cues required;Tactile cues required            PT Short Term Goals - 06/01/20 1611      PT SHORT TERM GOAL #1   Title Patient will be independent in home exercise program to improve strength/mobility for better functional independence with ADLs.    Baseline 5/19: HEP next session 6/22: HEP compliant 8/10 : HEP progressions compliant 8/31 HEP compliant 10/5: HEP compliant    Time 4    Period Weeks    Status Achieved    Target Date 01/13/20      PT SHORT TERM GOAL #2   Title Patient will perform a sit to stand with SUE support for increased ind. with transfers.    Baseline 5/19: heavy BUE support 6/22: requires one hand on walker one hand on w/c 8/10: able to perform intermittently (some days can some days cannot) 8/31: attempt next session 10/5: BUE reliance, patient unable to complete 4 full stands. Attempt next session 1/25: able to perform from elevated surface only    Time 4    Period Weeks    Status Partially Met    Target  Date 06/29/20             PT Long Term Goals - 06/03/20 0001      PT LONG TERM GOAL #1   Title Patient will increase FOTO score to equal to or greater than  53/100   to demonstrate statistically significant improvement in mobility and quality of life.     Baseline 8/31: 48.13% 7/27:  48.1% 6/22: 46.99% 5/18: 48/100 10/5: 49/100 11/2: 48%  12/30: 46.9% 1/25: 46.99%    Time 12    Period Weeks    Status Partially Met    Target Date 08/24/20      PT LONG TERM GOAL #2   Title  Patient (< 54 years old) will complete five times sit to stand test in < 20 seconds indicating an increased LE strength and improved balance.    Baseline 7/27: 26.8 seconds with one partial stand 6/22: 40. 86 sec; unable to reach full stand last two attempts 5/19: 42 seconds 10/5: BUE reliance, patient unable to complete 4 full stands. Attempt next session 11/2: 23.71 seconds 12/30: 30.32 with one near LOB due to spasms 1/25: 27.11 seconds with one hand on RW one hand on chair    Time 12    Period Weeks    Status Partially Met    Target Date 08/24/20      PT LONG TERM GOAL #3   Title Patient will increase 10 meter walk test to <20 seconds as to improve gait speed for better community ambulation and to reduce fall risk.    Baseline 8/31: 35.1 seconds with RW 7/27: 36 seconds with RW 6/22: 36.1 seconds 5/19: 42 seconds 10/5: 31.9 seconds with RW 11/2: 29.9 seconds with RW 12/30: deferred due to spasm 1/25: defer to next session 1/27: 29 seconds with RW    Time 12    Status Partially Met    Target Date 08/24/20      PT LONG TERM GOAL #4   Title Patient will reduce timed up and go to <11 seconds to reduce fall risk and demonstrate improved transfer/gait ability.    Baseline 8/31: 50.8 seconds with RW 7/27: 43.65 6/22: 45.4 seconds 5/19: 47 seconds 10/5: 38.5 seconds with RW 11/2: 35.72 seconds with RW 12/30: 43.5 seconds with wheels wet. 1/25: deferred 1/27: 38.9 seconds    Time 12    Period Weeks    Status Partially  Met    Target Date 08/24/20      PT LONG TERM GOAL #5   Title Patient will ambulate from physical therapy gym to elevator down the hall, ride elevator to first floor, and walk to valet with RW and chair follow for increased capacity for mobility and ind.    Baseline 8/31: able to open door ~50 ft with one rest break 8/10: unable to ambulate to elevator without rest break 9/30: able to ambulate to elevator with 3 rest breaks 1/25: 181 ft with 2 seated rest break (to elevator)    Time 12    Period Weeks    Status Partially Met    Target Date 08/24/20      PT LONG TERM GOAL #6   Title Patient will be able to stand with RW and lift one leg onto 4" step for carryover to negotiation of steps at home.     Baseline 11/2: unable to perform 1/25: R foot 0.75 inch lift    Time 12    Period Weeks    Status Partially Met    Target Date 08/24/20                 Plan - 07/08/20 1051    Clinical Impression Statement Patient is highly motivated and is progressing with functional ambulation capacity and mobility with decreased rest breaks. She was very fatigued by end of session requiirng ice pack and cues for breathing. Isometric contractions continue to improve with strength and increased resistance applied by PT. The patient would benefit from continued skilled PT intervention to maximize QOL, safety, and function    Personal Factors and Comorbidities Age;Comorbidity 3+;Fitness;Past/Current Experience;Sex;Time since onset of injury/illness/exacerbation    Comorbidities HTN, SVT, neurogenic bowel, MS (1995), neuropathy, hx of  wrist fx, HPV, Hypercholesteremia, adiposity, osteopenia, spasticity.    Examination-Activity Limitations Bathing;Bed Mobility;Bend;Carry;Continence;Dressing;Hygiene/Grooming;Stairs;Squat;Reach Overhead;Locomotion Level;Lift;Stand;Transfers    Examination-Participation Restrictions Church;Cleaning;Community Activity;Laundry;Volunteer;Shop;Meal Prep;Yard Work     [employer] Evolving/Moderate complexity    Rehab Potential Good    PT Frequency 2x / week    PT Duration 12 weeks    PT Treatment/Interventions ADLs/Self Care Home Management;Aquatic Therapy;Biofeedback;Cryotherapy;Electrical Stimulation;Iontophoresis 35m/ml Dexamethasone;Traction;Moist Heat;Ultrasound;DME Instruction;Gait training;Stair training;Functional mobility training;Therapeutic activities;Therapeutic exercise;Balance training;Neuromuscular re-education;Patient/family education;Orthotic Fit/Training;Wheelchair mobility training;Manual techniques;Compression bandaging;Passive range of motion;Dry needling;Energy conservation;Taping;Splinting;Vestibular    PT Next Visit Plan step negotiation, turning directions to the L and R    PT Home Exercise Plan maintain current HEP from [place] Agree with Plan of Care Patient           Patient will benefit from skilled therapeutic intervention in order to improve the following deficits and impairments:  Abnormal gait,Decreased activity tolerance,Decreased balance,Decreased coordination,Decreased endurance,Decreased mobility,Decreased range of motion,Decreased strength,Difficulty walking,Impaired perceived functional ability,Impaired sensation,Impaired tone,Impaired UE functional use,Improper body mechanics,Postural dysfunction,Cardiopulmonary status limiting activity,Impaired flexibility,Increased muscle spasms  Visit Diagnosis: Multiple sclerosis exacerbation ([facility])  Muscle weakness (generalized)  Other abnormalities of gait and mobility  Unsteadiness on feet     Problem List Patient Active Problem List   Diagnosis Date Noted  . Hypoalbuminemia due to protein-calorie malnutrition ([place]   . Neurogenic bowel   . Neurogenic bladder   . Labile blood pressure   . Neuropathic pain   . Abscess of female pelvis   . SVT (supraventricular tachycardia) ([place]   . Radial styloid tenosynovitis 03/12/2018  .  Wheelchair confinement 02/27/2018  . Localized osteoporosis with current pathological fracture with routine healing 01/19/2017  . Wrist fracture 01/16/2017  . Sprain of ankle 03/23/2016  . Closed fracture of lateral malleolus 03/16/2016  . Health care maintenance 01/24/2016  . Blood pressure elevated without history of HTN 10/25/2015  . Essential hypertension 10/25/2015  . Multiple sclerosis ([place] 10/02/2015  . Chronic left shoulder pain 07/19/2015  . Multiple sclerosis exacerbation ([place] 07/14/2015  . MS (multiple sclerosis) ([place] 11/26/2014  . Increased body mass index 11/26/2014  . HPV test positive 11/26/2014  . Status post laparoscopic supracervical hysterectomy 11/26/2014  . Galactorrhea 11/26/2014  . Back ache 05/21/2014  . Adiposity 05/21/2014  . Disordered sleep 05/21/2014  . Muscle spasticity 05/21/2014  . Spasticity 05/21/2014  . Calculus of kidney 12/09/2013  . Renal colic 001/74/9449 . Hypercholesteremia 08/19/2013  . Hereditary and idiopathic neuropathy 08/19/2013  . Hypercholesterolemia without hypertriglyceridemia 08/19/2013  . Bladder infection, chronic 07/25/2012  . Disorder of bladder function 07/25/2012  . Incomplete bladder emptying 07/25/2012  . Microscopic hematuria 07/25/2012  . Right upper quadrant pain 07/25/2012    MJanna Arch PT, DPT   07/08/2020, 1:01 PM  [facility] [facility] [address] [address]Phone: [phone]  Fax:  [phone] Name: JHaizel GatchellRamseur MRN: [id number] of Birth: 6Feb 10, 1962

## 2020-07-13 ENCOUNTER — Ambulatory Visit: Payer: PPO

## 2020-07-13 ENCOUNTER — Other Ambulatory Visit: Payer: Self-pay

## 2020-07-13 DIAGNOSIS — G35 Multiple sclerosis: Secondary | ICD-10-CM | POA: Diagnosis not present

## 2020-07-13 DIAGNOSIS — R2681 Unsteadiness on feet: Secondary | ICD-10-CM

## 2020-07-13 DIAGNOSIS — M6281 Muscle weakness (generalized): Secondary | ICD-10-CM

## 2020-07-13 DIAGNOSIS — R2689 Other abnormalities of gait and mobility: Secondary | ICD-10-CM

## 2020-07-13 NOTE — Therapy (Signed)
[place] MAIN [facility] SERVICES [address] [place], [place], [address] Phone: [phone]   Fax:  [phone]  Physical Therapy Treatment  Patient Details  Name: Lisa Williams MRN: [id number] Date of Birth: 05-21-1960 Referring Provider (PT): Gurney Maxin, MD   Encounter Date: 07/13/2020   PT End of Session - 07/13/20 1207    Visit Number [id number]    Number of Visits 91    Date for PT Re-Evaluation 08/24/20    Authorization Type 76/10 PN 2/3    PT Start Time 1015    PT Stop Time 1059    PT Time Calculation (min) 44 min    Equipment Utilized During Treatment Gait belt;Other (comment)   ice pack   Activity Tolerance Patient tolerated treatment well    Behavior During Therapy WFL for tasks assessed/performed           Past Medical History:  Diagnosis Date  . Abdominal pain, right upper quadrant   . Back pain   . Calculus of kidney 12/09/2013  . Chronic back pain    unspecified  . Chronic left shoulder pain 07/19/2015  . Complication of anesthesia   . Functional disorder of bladder    other  . Galactorrhea 11/26/2014   Chronic   . Hereditary and idiopathic neuropathy 08/19/2013  . HPV test positive   . Hypercholesteremia 08/19/2013  . Hypertension   . Incomplete bladder emptying   . Microscopic hematuria   . MS (multiple sclerosis) ([place])   . Muscle spasticity 05/21/2014  . Nonspecific findings on examination of urine    other  . Osteopenia   . PONV (postoperative nausea and vomiting)   . Status post laparoscopic supracervical hysterectomy 11/26/2014  . Tobacco user 11/26/2014  . Wrist fracture     Past Surgical History:  Procedure Laterality Date  . bilateral tubal ligation  1996  . BREAST CYST EXCISION Left 2002  . FRACTURE SURGERY    . KNEE SURGERY     right  . LAPAROSCOPIC SUPRACERVICAL HYSTERECTOMY  08/05/2013  . ORIF WRIST FRACTURE Left 01/17/2017   Procedure: OPEN REDUCTION INTERNAL FIXATION (ORIF) WRIST  FRACTURE;  Surgeon: Lovell Sheehan, MD;  Location: [facility];  Service: Orthopedics;  Laterality: Left;  . RADIOLOGY WITH ANESTHESIA N/A 03/18/2020   Procedure: MRI WITH ANESTHESIA CERVICAL SPINE AND BRAIN  WITH AND WITHOUT CONTRAST;  Surgeon: Radiologist, Medication, MD;  Location: [place];  Service: Radiology;  Laterality: N/A;  . TUBAL LIGATION Bilateral   . VAGINAL HYSTERECTOMY  03/2006    There were no vitals filed for this visit.   Subjective Assessment - 07/13/20 1205    Subjective Patient reports feeling good today. No falls or LOB since last session.    Pertinent History Patient is previous patient of this therapist, was admitted to the hospital on 08/03/19 for MS exacerbation and then went to inpatient rehab on 08/08/19. Patient then received home health therapy and now is returning to outpatient PT. PMh includes HTN, SVT, neurogenic bowel, MS (1995), neuropathy, hx of wrist fx, HPV, Hypercholesteremia, adiposity, osteopenia, spasticity. She wears bilateral AFOs and uses a RW and/or wheelchair for mobility. She drives an adapted car.    Limitations Lifting;Standing;Walking;House hold activities    How long can you sit comfortably? n/a    How long can you stand comfortably? 4 minutes    How long can you walk comfortably? walked 50 ft in inpatient rehab    Patient Stated Goals return to PLOF, increase  walking, get into shower, increase mobility    Currently in Pain? No/denies                Standing in // bars:  -Standing weight shifts 2x10 each side; UE support focus on hip shift rather than pull with arm -hip flexion into GTB across // bars 10x each LE no rest break -cones on short side of 6" step on side, SUE support, reach down pick up cone and pass across midline 3 cones, x2 trials each UE      Seated:   green dynadisc presses 15x each LE; min A for setup Hamstring stretch on PT leg 2x30 seconds; x 2 sets  RTB abduction 15x Adduction ball squeeze 15x Middle delt raises  15x Single arm raise with opp UE in straight arm abduction 10x each UE Posterior delt raise 15x Lateral flexion and abduction half circle arcs 10x each UE   Ice pack donned throughout session to minimize exacerbation of MS symptoms in order to perform interventions.      Pt educated throughout session about proper posture and technique with exercises. Improved exercise technique, movement at target joints, use of target muscles after min to mod verbal, visual, tactile cues  Patient tolerated progressive standing and seated interventions well with no episodes of LOB. She is able to reach lower without losing balance as seen in session today. Upper extremity and core continue to progress with strengthening and injury reduction interventions.  The patient would benefit from continued skilled PT intervention to maximize QOL, safety, and function                        PT Education - 07/13/20 1206    Education provided Yes    Education Details exercise technique, body mechanics    Person(s) Educated Patient    Methods Explanation;Demonstration;Tactile cues;Verbal cues    Comprehension Verbalized understanding;Returned demonstration;Verbal cues required;Tactile cues required            PT Short Term Goals - 06/01/20 1611      PT SHORT TERM GOAL #1   Title Patient will be independent in home exercise program to improve strength/mobility for better functional independence with ADLs.    Baseline 5/19: HEP next session 6/22: HEP compliant 8/10 : HEP progressions compliant 8/31 HEP compliant 10/5: HEP compliant    Time 4    Period Weeks    Status Achieved    Target Date 01/13/20      PT SHORT TERM GOAL #2   Title Patient will perform a sit to stand with SUE support for increased ind. with transfers.    Baseline 5/19: heavy BUE support 6/22: requires one hand on walker one hand on w/c 8/10: able to perform intermittently (some days can some days cannot) 8/31: attempt next  session 10/5: BUE reliance, patient unable to complete 4 full stands. Attempt next session 1/25: able to perform from elevated surface only    Time 4    Period Weeks    Status Partially Met    Target Date 06/29/20             PT Long Term Goals - 06/03/20 0001      PT LONG TERM GOAL #1   Title Patient will increase FOTO score to equal to or greater than  53/100   to demonstrate statistically significant improvement in mobility and quality of life.     Baseline 8/31: 48.13% 7/27:  48.1% 6/22: 46.99%  5/18: 48/100 10/5: 49/100 11/2: 48%  12/30: 46.9% 1/25: 46.99%    Time 12    Period Weeks    Status Partially Met    Target Date 08/24/20      PT LONG TERM GOAL #2   Title Patient (< 31 years old) will complete five times sit to stand test in < 20 seconds indicating an increased LE strength and improved balance.    Baseline 7/27: 26.8 seconds with one partial stand 6/22: 40. 86 sec; unable to reach full stand last two attempts 5/19: 42 seconds 10/5: BUE reliance, patient unable to complete 4 full stands. Attempt next session 11/2: 23.71 seconds 12/30: 30.32 with one near LOB due to spasms 1/25: 27.11 seconds with one hand on RW one hand on chair    Time 12    Period Weeks    Status Partially Met    Target Date 08/24/20      PT LONG TERM GOAL #3   Title Patient will increase 10 meter walk test to <20 seconds as to improve gait speed for better community ambulation and to reduce fall risk.    Baseline 8/31: 35.1 seconds with RW 7/27: 36 seconds with RW 6/22: 36.1 seconds 5/19: 42 seconds 10/5: 31.9 seconds with RW 11/2: 29.9 seconds with RW 12/30: deferred due to spasm 1/25: defer to next session 1/27: 29 seconds with RW    Time 12    Status Partially Met    Target Date 08/24/20      PT LONG TERM GOAL #4   Title Patient will reduce timed up and go to <11 seconds to reduce fall risk and demonstrate improved transfer/gait ability.    Baseline 8/31: 50.8 seconds with RW 7/27: 43.65 6/22:  45.4 seconds 5/19: 47 seconds 10/5: 38.5 seconds with RW 11/2: 35.72 seconds with RW 12/30: 43.5 seconds with wheels wet. 1/25: deferred 1/27: 38.9 seconds    Time 12    Period Weeks    Status Partially Met    Target Date 08/24/20      PT LONG TERM GOAL #5   Title Patient will ambulate from physical therapy gym to elevator down the hall, ride elevator to first floor, and walk to valet with RW and chair follow for increased capacity for mobility and ind.    Baseline 8/31: able to open door ~50 ft with one rest break 8/10: unable to ambulate to elevator without rest break 9/30: able to ambulate to elevator with 3 rest breaks 1/25: 181 ft with 2 seated rest break (to elevator)    Time 12    Period Weeks    Status Partially Met    Target Date 08/24/20      PT LONG TERM GOAL #6   Title Patient will be able to stand with RW and lift one leg onto 4" step for carryover to negotiation of steps at home.     Baseline 11/2: unable to perform 1/25: R foot 0.75 inch lift    Time 12    Period Weeks    Status Partially Met    Target Date 08/24/20                 Plan - 07/13/20 1212    Clinical Impression Statement Patient tolerated progressive standing and seated interventions well with no episodes of LOB. She is able to reach lower without losing balance as seen in session today. Upper extremity and core continue to progress with strengthening and injury reduction interventions.  The patient would benefit from continued skilled PT intervention to maximize QOL, safety, and function    Personal Factors and Comorbidities Age;Comorbidity 3+;Fitness;Past/Current Experience;Sex;Time since onset of injury/illness/exacerbation    Comorbidities HTN, SVT, neurogenic bowel, MS (1995), neuropathy, hx of wrist fx, HPV, Hypercholesteremia, adiposity, osteopenia, spasticity.    Examination-Activity Limitations Bathing;Bed Mobility;Bend;Carry;Continence;Dressing;Hygiene/Grooming;Stairs;Squat;Reach  Overhead;Locomotion Level;Lift;Stand;Transfers    Examination-Participation Restrictions Church;Cleaning;Community Activity;Laundry;Volunteer;Shop;Meal Prep;Yard Work    [employer] Evolving/Moderate complexity    Rehab Potential Good    PT Frequency 2x / week    PT Duration 12 weeks    PT Treatment/Interventions ADLs/Self Care Home Management;Aquatic Therapy;Biofeedback;Cryotherapy;Electrical Stimulation;Iontophoresis 10m/ml Dexamethasone;Traction;Moist Heat;Ultrasound;DME Instruction;Gait training;Stair training;Functional mobility training;Therapeutic activities;Therapeutic exercise;Balance training;Neuromuscular re-education;Patient/family education;Orthotic Fit/Training;Wheelchair mobility training;Manual techniques;Compression bandaging;Passive range of motion;Dry needling;Energy conservation;Taping;Splinting;Vestibular    PT Next Visit Plan step negotiation, turning directions to the L and R    PT Home Exercise Plan maintain current HEP from [facility] Agree with Plan of Care Patient           Patient will benefit from skilled therapeutic intervention in order to improve the following deficits and impairments:  Abnormal gait,Decreased activity tolerance,Decreased balance,Decreased coordination,Decreased endurance,Decreased mobility,Decreased range of motion,Decreased strength,Difficulty walking,Impaired perceived functional ability,Impaired sensation,Impaired tone,Impaired UE functional use,Improper body mechanics,Postural dysfunction,Cardiopulmonary status limiting activity,Impaired flexibility,Increased muscle spasms  Visit Diagnosis: Multiple sclerosis exacerbation ([facility])  Muscle weakness (generalized)  Other abnormalities of gait and mobility  Unsteadiness on feet     Problem List Patient Active Problem List   Diagnosis Date Noted  . Hypoalbuminemia due to protein-calorie malnutrition ([place]   . Neurogenic bowel   . Neurogenic bladder   .  Labile blood pressure   . Neuropathic pain   . Abscess of female pelvis   . SVT (supraventricular tachycardia) ([place]   . Radial styloid tenosynovitis 03/12/2018  . Wheelchair confinement 02/27/2018  . Localized osteoporosis with current pathological fracture with routine healing 01/19/2017  . Wrist fracture 01/16/2017  . Sprain of ankle 03/23/2016  . Closed fracture of lateral malleolus 03/16/2016  . Health care maintenance 01/24/2016  . Blood pressure elevated without history of HTN 10/25/2015  . Essential hypertension 10/25/2015  . Multiple sclerosis ([place] 10/02/2015  . Chronic left shoulder pain 07/19/2015  . Multiple sclerosis exacerbation ([place] 07/14/2015  . MS (multiple sclerosis) ([place] 11/26/2014  . Increased body mass index 11/26/2014  . HPV test positive 11/26/2014  . Status post laparoscopic supracervical hysterectomy 11/26/2014  . Galactorrhea 11/26/2014  . Back ache 05/21/2014  . Adiposity 05/21/2014  . Disordered sleep 05/21/2014  . Muscle spasticity 05/21/2014  . Spasticity 05/21/2014  . Calculus of kidney 12/09/2013  . Renal colic 073/71/0626 . Hypercholesteremia 08/19/2013  . Hereditary and idiopathic neuropathy 08/19/2013  . Hypercholesterolemia without hypertriglyceridemia 08/19/2013  . Bladder infection, chronic 07/25/2012  . Disorder of bladder function 07/25/2012  . Incomplete bladder emptying 07/25/2012  . Microscopic hematuria 07/25/2012  . Right upper quadrant pain 07/25/2012   MJanna Williams PT, DPT   07/13/2020, 12:20 PM  CEddyMAIN RProv[facility] CenterSERVICES [address] [place] [address]Phone: [phone]  Fax:  [phone] Name: Lisa Williams MRN: [id number] of Birth: 606/16/1962

## 2020-07-15 ENCOUNTER — Ambulatory Visit: Payer: PPO

## 2020-07-15 ENCOUNTER — Other Ambulatory Visit: Payer: Self-pay

## 2020-07-15 DIAGNOSIS — G35 Multiple sclerosis: Secondary | ICD-10-CM | POA: Diagnosis not present

## 2020-07-15 DIAGNOSIS — R2681 Unsteadiness on feet: Secondary | ICD-10-CM

## 2020-07-15 DIAGNOSIS — R2689 Other abnormalities of gait and mobility: Secondary | ICD-10-CM

## 2020-07-15 DIAGNOSIS — M6281 Muscle weakness (generalized): Secondary | ICD-10-CM

## 2020-07-15 NOTE — Therapy (Signed)
[place] [facility] SERVICES [address] [place], [place], [address] Phone: [phone]   Fax:  [phone]  Physical Therapy Treatment  Patient Details  Name: Lisa Williams MRN: [id number] Date of Birth: 1960/12/05 Referring Provider (PT): Gurney Maxin, MD   Encounter Date: 07/15/2020   PT End of Session - 07/15/20 5597    Visit Number 60    Number of Visits 91    Date for PT Re-Evaluation 08/24/20    Authorization Type 8/10 PN 2/3    PT Start Time 4163    PT Stop Time 1058    PT Time Calculation (min) 43 min    Equipment Utilized During Treatment Gait belt;Other (comment)   ice pack   Activity Tolerance Patient tolerated treatment well    Behavior During Therapy WFL for tasks assessed/performed           Past Medical History:  Diagnosis Date  . Abdominal pain, right upper quadrant   . Back pain   . Calculus of kidney 12/09/2013  . Chronic back pain    unspecified  . Chronic left shoulder pain 07/19/2015  . Complication of anesthesia   . Functional disorder of bladder    other  . Galactorrhea 11/26/2014   Chronic   . Hereditary and idiopathic neuropathy 08/19/2013  . HPV test positive   . Hypercholesteremia 08/19/2013  . Hypertension   . Incomplete bladder emptying   . Microscopic hematuria   . MS (multiple sclerosis) ([place])   . Muscle spasticity 05/21/2014  . Nonspecific findings on examination of urine    other  . Osteopenia   . PONV (postoperative nausea and vomiting)   . Status post laparoscopic supracervical hysterectomy 11/26/2014  . Tobacco user 11/26/2014  . Wrist fracture     Past Surgical History:  Procedure Laterality Date  . bilateral tubal ligation  1996  . BREAST CYST EXCISION Left 2002  . FRACTURE SURGERY    . KNEE SURGERY     right  . LAPAROSCOPIC SUPRACERVICAL HYSTERECTOMY  08/05/2013  . ORIF WRIST FRACTURE Left 01/17/2017   Procedure: OPEN REDUCTION INTERNAL FIXATION (ORIF) WRIST  FRACTURE;  Surgeon: Lovell Sheehan, MD;  Location: [facility];  Service: Orthopedics;  Laterality: Left;  . RADIOLOGY WITH ANESTHESIA N/A 03/18/2020   Procedure: MRI WITH ANESTHESIA CERVICAL SPINE AND BRAIN  WITH AND WITHOUT CONTRAST;  Surgeon: Radiologist, Medication, MD;  Location: [place];  Service: Radiology;  Laterality: N/A;  . TUBAL LIGATION Bilateral   . VAGINAL HYSTERECTOMY  03/2006    There were no vitals filed for this visit.   Subjective Assessment - 07/15/20 1635    Subjective Patient reports no LOB or falls since last session. Feels good today and has brought walker to practice ambulation.    Pertinent History Patient is previous patient of this therapist, was admitted to the hospital on 08/03/19 for MS exacerbation and then went to inpatient rehab on 08/08/19. Patient then received home health therapy and now is returning to outpatient PT. PMh includes HTN, SVT, neurogenic bowel, MS (1995), neuropathy, hx of wrist fx, HPV, Hypercholesteremia, adiposity, osteopenia, spasticity. She wears bilateral AFOs and uses a RW and/or wheelchair for mobility. She drives an adapted car.    Limitations Lifting;Standing;Walking;House hold activities    How long can you sit comfortably? n/a    How long can you stand comfortably? 4 minutes    How long can you walk comfortably? walked 50 ft in inpatient rehab  Patient Stated Goals return to PLOF, increase walking, get into shower, increase mobility    Currently in Pain? No/denies                  Gait: Patient ambulates 830f, 1 seated rest break CGA and wheelchair follow for safety. Patient able to navigate opening doors, turns, and thresholds independently but with inc time to perform. Patient demonstrates inc reliance on BUE by end of gait trial. Longest duration ambulation to date.     Seated:   Seated boxing: cues for sequencing, body mechanics, and pace/rhythm for functional contraction and timing of muscle recruitment: -Cross body  punches to mitts on PT hands with no back support for focused core stabilization with crossing midline 2x 60 seconds  -Cross body punch with secondary combination of elbow for full cross body rotation to PT mitt for trunk stability, coordination, and cardiovascular challenge x 60 seconds -Upper cut to mitts in PT hands for core activation without back support with perturbations 60 seconds  iIce pack donned throughout session to minimize exacerbation of MS symptoms in order to perform interventions.    Pt educated throughout session about proper posture and technique with exercises. Improved exercise technique, movement at target joints, use of target muscles after min to mod verbal, visual, tactile cues.                              PT Education - 07/15/20 1636    Education provided Yes    Education Details exercise technique, bdy mechanics    Person(s) Educated Patient    Methods Explanation;Tactile cues;Demonstration;Verbal cues    Comprehension Verbalized understanding;Returned demonstration;Verbal cues required;Tactile cues required            PT Short Term Goals - 06/01/20 1611      PT SHORT TERM GOAL #1   Title Patient will be independent in home exercise program to improve strength/mobility for better functional independence with ADLs.    Baseline 5/19: HEP next session 6/22: HEP compliant 8/10 : HEP progressions compliant 8/31 HEP compliant 10/5: HEP compliant    Time 4    Period Weeks    Status Achieved    Target Date 01/13/20      PT SHORT TERM GOAL #2   Title Patient will perform a sit to stand with SUE support for increased ind. with transfers.    Baseline 5/19: heavy BUE support 6/22: requires one hand on walker one hand on w/c 8/10: able to perform intermittently (some days can some days cannot) 8/31: attempt next session 10/5: BUE reliance, patient unable to complete 4 full stands. Attempt next session 1/25: able to perform from elevated surface  only    Time 4    Period Weeks    Status Partially Met    Target Date 06/29/20             PT Long Term Goals - 06/03/20 0001      PT LONG TERM GOAL #1   Title Patient will increase FOTO score to equal to or greater than  53/100   to demonstrate statistically significant improvement in mobility and quality of life.     Baseline 8/31: 48.13% 7/27:  48.1% 6/22: 46.99% 5/18: 48/100 10/5: 49/100 11/2: 48%  12/30: 46.9% 1/25: 46.99%    Time 12    Period Weeks    Status Partially Met    Target Date 08/24/20  PT LONG TERM GOAL #2   Title Patient (< 40 years old) will complete five times sit to stand test in < 20 seconds indicating an increased LE strength and improved balance.    Baseline 7/27: 26.8 seconds with one partial stand 6/22: 40. 86 sec; unable to reach full stand last two attempts 5/19: 42 seconds 10/5: BUE reliance, patient unable to complete 4 full stands. Attempt next session 11/2: 23.71 seconds 12/30: 30.32 with one near LOB due to spasms 1/25: 27.11 seconds with one hand on RW one hand on chair    Time 12    Period Weeks    Status Partially Met    Target Date 08/24/20      PT LONG TERM GOAL #3   Title Patient will increase 10 meter walk test to <20 seconds as to improve gait speed for better community ambulation and to reduce fall risk.    Baseline 8/31: 35.1 seconds with RW 7/27: 36 seconds with RW 6/22: 36.1 seconds 5/19: 42 seconds 10/5: 31.9 seconds with RW 11/2: 29.9 seconds with RW 12/30: deferred due to spasm 1/25: defer to next session 1/27: 29 seconds with RW    Time 12    Status Partially Met    Target Date 08/24/20      PT LONG TERM GOAL #4   Title Patient will reduce timed up and go to <11 seconds to reduce fall risk and demonstrate improved transfer/gait ability.    Baseline 8/31: 50.8 seconds with RW 7/27: 43.65 6/22: 45.4 seconds 5/19: 47 seconds 10/5: 38.5 seconds with RW 11/2: 35.72 seconds with RW 12/30: 43.5 seconds with wheels wet. 1/25: deferred  1/27: 38.9 seconds    Time 12    Period Weeks    Status Partially Met    Target Date 08/24/20      PT LONG TERM GOAL #5   Title Patient will ambulate from physical therapy gym to elevator down the hall, ride elevator to first floor, and walk to valet with RW and chair follow for increased capacity for mobility and ind.    Baseline 8/31: able to open door ~50 ft with one rest break 8/10: unable to ambulate to elevator without rest break 9/30: able to ambulate to elevator with 3 rest breaks 1/25: 181 ft with 2 seated rest break (to elevator)    Time 12    Period Weeks    Status Partially Met    Target Date 08/24/20      PT LONG TERM GOAL #6   Title Patient will be able to stand with RW and lift one leg onto 4" step for carryover to negotiation of steps at home.     Baseline 11/2: unable to perform 1/25: R foot 0.75 inch lift    Time 12    Period Weeks    Status Partially Met    Target Date 08/24/20                 Plan - 07/15/20 1649    Clinical Impression Statement Patient ambulated further duration to date with least amount of rest breaks this session. She continues to progress with functional ambulation and capacity for functional mobility. Patient is highly motivated and eager to continue progression of care.  Upper extremity and core continue to progress with strengthening and injury reduction interventions. The patient would benefit from continued skilled PT intervention to maximize QOL, safety, and function    Personal Factors and Comorbidities Age;Comorbidity 3+;Fitness;Past/Current Experience;Sex;Time since onset of  injury/illness/exacerbation    Comorbidities HTN, SVT, neurogenic bowel, MS (1995), neuropathy, hx of wrist fx, HPV, Hypercholesteremia, adiposity, osteopenia, spasticity.    Examination-Activity Limitations Bathing;Bed Mobility;Bend;Carry;Continence;Dressing;Hygiene/Grooming;Stairs;Squat;Reach Overhead;Locomotion Level;Lift;Stand;Transfers     Examination-Participation Restrictions Church;Cleaning;Community Activity;Laundry;Volunteer;Shop;Meal Prep;Yard Work    [employer] Evolving/Moderate complexity    Rehab Potential Good    PT Frequency 2x / week    PT Duration 12 weeks    PT Treatment/Interventions ADLs/Self Care Home Management;Aquatic Therapy;Biofeedback;Cryotherapy;Electrical Stimulation;Iontophoresis 65m/ml Dexamethasone;Traction;Moist Heat;Ultrasound;DME Instruction;Gait training;Stair training;Functional mobility training;Therapeutic activities;Therapeutic exercise;Balance training;Neuromuscular re-education;Patient/family education;Orthotic Fit/Training;Wheelchair mobility training;Manual techniques;Compression bandaging;Passive range of motion;Dry needling;Energy conservation;Taping;Splinting;Vestibular    PT Next Visit Plan step negotiation, turning directions to the L and R    PT Home Exercise Plan maintain current HEP from [facility] Agree with Plan of Care Patient           Patient will benefit from skilled therapeutic intervention in order to improve the following deficits and impairments:  Abnormal gait,Decreased activity tolerance,Decreased balance,Decreased coordination,Decreased endurance,Decreased mobility,Decreased range of motion,Decreased strength,Difficulty walking,Impaired perceived functional ability,Impaired sensation,Impaired tone,Impaired UE functional use,Improper body mechanics,Postural dysfunction,Cardiopulmonary status limiting activity,Impaired flexibility,Increased muscle spasms  Visit Diagnosis: Multiple sclerosis exacerbation ([facility])  Muscle weakness (generalized)  Other abnormalities of gait and mobility  Unsteadiness on feet     Problem List Patient Active Problem List   Diagnosis Date Noted  . Hypoalbuminemia due to protein-calorie malnutrition ([place]   . Neurogenic bowel   . Neurogenic bladder   . Labile blood pressure   . Neuropathic pain   .  Abscess of female pelvis   . SVT (supraventricular tachycardia) ([place]   . Radial styloid tenosynovitis 03/12/2018  . Wheelchair confinement 02/27/2018  . Localized osteoporosis with current pathological fracture with routine healing 01/19/2017  . Wrist fracture 01/16/2017  . Sprain of ankle 03/23/2016  . Closed fracture of lateral malleolus 03/16/2016  . Health care maintenance 01/24/2016  . Blood pressure elevated without history of HTN 10/25/2015  . Essential hypertension 10/25/2015  . Multiple sclerosis ([place] 10/02/2015  . Chronic left shoulder pain 07/19/2015  . Multiple sclerosis exacerbation ([place] 07/14/2015  . MS (multiple sclerosis) ([place] 11/26/2014  . Increased body mass index 11/26/2014  . HPV test positive 11/26/2014  . Status post laparoscopic supracervical hysterectomy 11/26/2014  . Galactorrhea 11/26/2014  . Back ache 05/21/2014  . Adiposity 05/21/2014  . Disordered sleep 05/21/2014  . Muscle spasticity 05/21/2014  . Spasticity 05/21/2014  . Calculus of kidney 12/09/2013  . Renal colic 046/80/3212 . Hypercholesteremia 08/19/2013  . Hereditary and idiopathic neuropathy 08/19/2013  . Hypercholesterolemia without hypertriglyceridemia 08/19/2013  . Bladder infection, chronic 07/25/2012  . Disorder of bladder function 07/25/2012  . Incomplete bladder emptying 07/25/2012  . Microscopic hematuria 07/25/2012  . Right upper quadrant pain 07/25/2012   MJanna Arch PT, DPT   07/15/2020, 4:50 PM  [facility]egalMAIN [facility]SERV[facility] [address] [place] [address]Phone: [phone]  Fax:  [phone] Name: Lisa Williams MRN: [id number] of Birth: [birth year]

## 2020-07-20 ENCOUNTER — Ambulatory Visit: Payer: PPO

## 2020-07-20 ENCOUNTER — Other Ambulatory Visit: Payer: Self-pay

## 2020-07-20 DIAGNOSIS — M6281 Muscle weakness (generalized): Secondary | ICD-10-CM

## 2020-07-20 DIAGNOSIS — G35 Multiple sclerosis: Secondary | ICD-10-CM

## 2020-07-20 DIAGNOSIS — R2689 Other abnormalities of gait and mobility: Secondary | ICD-10-CM

## 2020-07-20 DIAGNOSIS — R2681 Unsteadiness on feet: Secondary | ICD-10-CM

## 2020-07-20 NOTE — Therapy (Signed)
[place] MAIN [facility] SERVICES [address] [place], [place], [address] Phone: [phone]   Fax:  [phone]  Physical Therapy Treatment  Patient Details  Name: Decie Verne Cypert MRN: [id number] Date of Birth: Jul 26, 1960 Referring Provider (PT): Gurney Maxin, MD   Encounter Date: 07/20/2020   PT End of Session - 07/20/20 1029    Visit Number 42    Number of Visits 91    Date for PT Re-Evaluation 08/24/20    PT Start Time 1016    PT Stop Time 1056    PT Time Calculation (min) 40 min    Equipment Utilized During Treatment Gait belt;Other (comment)    Activity Tolerance Patient tolerated treatment well    Behavior During Therapy [facility] for tasks assessed/performed           Past Medical History:  Diagnosis Date  . Abdominal pain, right upper quadrant   . Back pain   . Calculus of kidney 12/09/2013  . Chronic back pain    unspecified  . Chronic left shoulder pain 07/19/2015  . Complication of anesthesia   . Functional disorder of bladder    other  . Galactorrhea 11/26/2014   Chronic   . Hereditary and idiopathic neuropathy 08/19/2013  . HPV test positive   . Hypercholesteremia 08/19/2013  . Hypertension   . Incomplete bladder emptying   . Microscopic hematuria   . MS (multiple sclerosis) ([place])   . Muscle spasticity 05/21/2014  . Nonspecific findings on examination of urine    other  . Osteopenia   . PONV (postoperative nausea and vomiting)   . Status post laparoscopic supracervical hysterectomy 11/26/2014  . Tobacco user 11/26/2014  . Wrist fracture     Past Surgical History:  Procedure Laterality Date  . bilateral tubal ligation  1996  . BREAST CYST EXCISION Left 2002  . FRACTURE SURGERY    . KNEE SURGERY     right  . LAPAROSCOPIC SUPRACERVICAL HYSTERECTOMY  08/05/2013  . ORIF WRIST FRACTURE Left 01/17/2017   Procedure: OPEN REDUCTION INTERNAL FIXATION (ORIF) WRIST FRACTURE;  Surgeon: Lovell Sheehan, MD;  Location:  [facility];  Service: Orthopedics;  Laterality: Left;  . RADIOLOGY WITH ANESTHESIA N/A 03/18/2020   Procedure: MRI WITH ANESTHESIA CERVICAL SPINE AND BRAIN  WITH AND WITHOUT CONTRAST;  Surgeon: Radiologist, Medication, MD;  Location: [place];  Service: Radiology;  Laterality: N/A;  . TUBAL LIGATION Bilateral   . VAGINAL HYSTERECTOMY  03/2006    There were no vitals filed for this visit.   Subjective Assessment - 07/20/20 1028    Subjective No updates today since prior session. Pt doing well.    Pertinent History Patient is previous patient of this therapist, was admitted to the hospital on 08/03/19 for MS exacerbation and then went to inpatient rehab on 08/08/19. Patient then received home health therapy and now is returning to outpatient PT. PMh includes HTN, SVT, neurogenic bowel, MS (1995), neuropathy, hx of wrist fx, HPV, Hypercholesteremia, adiposity, osteopenia, spasticity. She wears bilateral AFOs and uses a RW and/or wheelchair for mobility. She drives an adapted car.    Currently in Pain? No/denies          INTERVENTION THIS DATE:   Standing in // bars: -Standing weight shifts 2x10 each side; UE support focus on hip shift rather than pull with arm -hip flexion into GTB across // bars 10x each LE no rest break -standing c single UE support on // bars; forward, downward reach to  vertical platform to pick up 4 cones, then return 4 cones with CL UE.  *2 sets c vertical step height, 2 sets with long-ways orientation of step    Seated:  -green dynadisc presses 15x each LE; min A for setup -Hamstring stretch on PT leg 2x30 seconds; x 2 sets  -RTB seated band-clam 1x15 (hips at ~90 degree) -Adduction ball squeeze 15x -Bilat synchronous shoulder ABD to 90 degrees 1x15 (A/ROM, no resistance) -bilat horizontal adduction of shoulders, elbows bent (fly machine) A/ROM 1x15  -unilateral shoulder flexion A/ROM c CL arm in isometric abduction 10x      PT Short Term Goals - 06/01/20 1611      PT  SHORT TERM GOAL #1   Title Patient will be independent in home exercise program to improve strength/mobility for better functional independence with ADLs.    Baseline 5/19: HEP next session 6/22: HEP compliant 8/10 : HEP progressions compliant 8/31 HEP compliant 10/5: HEP compliant    Time 4    Period Weeks    Status Achieved    Target Date 01/13/20      PT SHORT TERM GOAL #2   Title Patient will perform a sit to stand with SUE support for increased ind. with transfers.    Baseline 5/19: heavy BUE support 6/22: requires one hand on walker one hand on w/c 8/10: able to perform intermittently (some days can some days cannot) 8/31: attempt next session 10/5: BUE reliance, patient unable to complete 4 full stands. Attempt next session 1/25: able to perform from elevated surface only    Time 4    Period Weeks    Status Partially Met    Target Date 06/29/20             PT Long Term Goals - 06/03/20 0001      PT LONG TERM GOAL #1   Title Patient will increase FOTO score to equal to or greater than  53/100   to demonstrate statistically significant improvement in mobility and quality of life.     Baseline 8/31: 48.13% 7/27:  48.1% 6/22: 46.99% 5/18: 48/100 10/5: 49/100 11/2: 48%  12/30: 46.9% 1/25: 46.99%    Time 12    Period Weeks    Status Partially Met    Target Date 08/24/20      PT LONG TERM GOAL #2   Title Patient (< 48 years old) will complete five times sit to stand test in < 20 seconds indicating an increased LE strength and improved balance.    Baseline 7/27: 26.8 seconds with one partial stand 6/22: 40. 86 sec; unable to reach full stand last two attempts 5/19: 42 seconds 10/5: BUE reliance, patient unable to complete 4 full stands. Attempt next session 11/2: 23.71 seconds 12/30: 30.32 with one near LOB due to spasms 1/25: 27.11 seconds with one hand on RW one hand on chair    Time 12    Period Weeks    Status Partially Met    Target Date 08/24/20      PT LONG TERM GOAL #3    Title Patient will increase 10 meter walk test to <20 seconds as to improve gait speed for better community ambulation and to reduce fall risk.    Baseline 8/31: 35.1 seconds with RW 7/27: 36 seconds with RW 6/22: 36.1 seconds 5/19: 42 seconds 10/5: 31.9 seconds with RW 11/2: 29.9 seconds with RW 12/30: deferred due to spasm 1/25: defer to next session 1/27: 29 seconds with RW  Time 12    Status Partially Met    Target Date 08/24/20      PT LONG TERM GOAL #4   Title Patient will reduce timed up and go to <11 seconds to reduce fall risk and demonstrate improved transfer/gait ability.    Baseline 8/31: 50.8 seconds with RW 7/27: 43.65 6/22: 45.4 seconds 5/19: 47 seconds 10/5: 38.5 seconds with RW 11/2: 35.72 seconds with RW 12/30: 43.5 seconds with wheels wet. 1/25: deferred 1/27: 38.9 seconds    Time 12    Period Weeks    Status Partially Met    Target Date 08/24/20      PT LONG TERM GOAL #5   Title Patient will ambulate from physical therapy gym to elevator down the hall, ride elevator to first floor, and walk to valet with RW and chair follow for increased capacity for mobility and ind.    Baseline 8/31: able to open door ~50 ft with one rest break 8/10: unable to ambulate to elevator without rest break 9/30: able to ambulate to elevator with 3 rest breaks 1/25: 181 ft with 2 seated rest break (to elevator)    Time 12    Period Weeks    Status Partially Met    Target Date 08/24/20      PT LONG TERM GOAL #6   Title Patient will be able to stand with RW and lift one leg onto 4" step for carryover to negotiation of steps at home.     Baseline 11/2: unable to perform 1/25: R foot 0.75 inch lift    Time 12    Period Weeks    Status Partially Met    Target Date 08/24/20                 Plan - 07/20/20 1030    Clinical Impression Statement Continued with current plan of care as laid out in evaluation and recent prior sessions. Pt remains motivated to advance progress toward  goals. Rest breaks provided as needed, pt quick to ask when needed. Pt does require varying levels of assistance and cuing for completion of exercises for correct form and sometimes due to pain/weakness. Pt closely monitored throughout session to maximize patient safety during interventions. Pt continues to demonstrate progress toward goals AEB progression of some interventions this date either in volume or intensity.   Personal Factors and Comorbidities Age;Comorbidity 3+;Fitness;Past/Current Experience;Sex;Time since onset of injury/illness/exacerbation    Comorbidities HTN, SVT, neurogenic bowel, MS (1995), neuropathy, hx of wrist fx, HPV, Hypercholesteremia, adiposity, osteopenia, spasticity.    Examination-Activity Limitations Bathing;Bed Mobility;Bend;Carry;Continence;Dressing;Hygiene/Grooming;Stairs;Squat;Reach Overhead;Locomotion Level;Lift;Stand;Transfers    Examination-Participation Restrictions Church;Cleaning;Community Activity;Laundry;Volunteer;Shop;Meal Prep;Yard Work    [employer] Evolving/Moderate complexity    Clinical Decision Making Moderate    Rehab Potential Good    PT Frequency 2x / week    PT Duration 12 weeks    PT Treatment/Interventions ADLs/Self Care Home Management;Aquatic Therapy;Biofeedback;Cryotherapy;Electrical Stimulation;Iontophoresis 29m/ml Dexamethasone;Traction;Moist Heat;Ultrasound;DME Instruction;Gait training;Stair training;Functional mobility training;Therapeutic activities;Therapeutic exercise;Balance training;Neuromuscular re-education;Patient/family education;Orthotic Fit/Training;Wheelchair mobility training;Manual techniques;Compression bandaging;Passive range of motion;Dry needling;Energy conservation;Taping;Splinting;Vestibular    PT Next Visit Plan step negotiation, turning directions to the L and R    PT Home Exercise Plan maintain current HEP from HHPT    Consulted and Agree with Plan of Care Patient           Patient will  benefit from skilled therapeutic intervention in order to improve the following deficits and impairments:  Abnormal gait,Decreased activity tolerance,Decreased balance,Decreased coordination,Decreased endurance,Decreased mobility,Decreased range  of motion,Decreased strength,Difficulty walking,Impaired perceived functional ability,Impaired sensation,Impaired tone,Impaired UE functional use,Improper body mechanics,Postural dysfunction,Cardiopulmonary status limiting activity,Impaired flexibility,Increased muscle spasms  Visit Diagnosis: Multiple sclerosis exacerbation ([place])  Muscle weakness (generalized)  Other abnormalities of gait and mobility  Unsteadiness on feet     Problem List Patient Active Problem List   Diagnosis Date Noted  . Hypoalbuminemia due to protein-calorie malnutrition ([place])   . Neurogenic bowel   . Neurogenic bladder   . Labile blood pressure   . Neuropathic pain   . Abscess of female pelvis   . SVT (supraventricular tachycardia) ([place])   . Radial styloid tenosynovitis 03/12/2018  . Wheelchair confinement 02/27/2018  . Localized osteoporosis with current pathological fracture with routine healing 01/19/2017  . Wrist fracture 01/16/2017  . Sprain of ankle 03/23/2016  . Closed fracture of lateral malleolus 03/16/2016  . Health care maintenance 01/24/2016  . Blood pressure elevated without history of HTN 10/25/2015  . Essential hypertension 10/25/2015  . Multiple sclerosis ([place]) 10/02/2015  . Chronic left shoulder pain 07/19/2015  . Multiple sclerosis exacerbation ([place]) 07/14/2015  . MS (multiple sclerosis) ([place]) 11/26/2014  . Increased body mass index 11/26/2014  . HPV test positive 11/26/2014  . Status post laparoscopic supracervical hysterectomy 11/26/2014  . Galactorrhea 11/26/2014  . Back ache 05/21/2014  . Adiposity 05/21/2014  . Disordered sleep 05/21/2014  . Muscle spasticity 05/21/2014  . Spasticity 05/21/2014  . Calculus of kidney 12/09/2013  .  Renal colic 00/92/3300  . Hypercholesteremia 08/19/2013  . Hereditary and idiopathic neuropathy 08/19/2013  . Hypercholesterolemia without hypertriglyceridemia 08/19/2013  . Bladder infection, chronic 07/25/2012  . Disorder of bladder function 07/25/2012  . Incomplete bladder emptying 07/25/2012  . Microscopic hematuria 07/25/2012  . Right upper quadrant pain 07/25/2012   10:48 AM, 07/20/20 Etta Grandchild, PT, DPT Physical Therapist - [place] [phone]     Etta Grandchild 07/20/2020, 10:36 AM  [place] MAIN [facility] SERVICES [address] [place], [place], [address] Phone: [phone]   Fax:  [phone]  Name: Stephanie Littman Erich MRN: [id number] Date of Birth: 1960-06-08

## 2020-07-22 ENCOUNTER — Telehealth: Payer: Self-pay | Admitting: Neurology

## 2020-07-22 ENCOUNTER — Ambulatory Visit: Payer: PPO

## 2020-07-22 ENCOUNTER — Encounter: Payer: Self-pay | Admitting: Neurology

## 2020-07-22 NOTE — Telephone Encounter (Signed)
I have dictated a letter for the patient, she wishes to have a letter mailed to her.

## 2020-07-22 NOTE — Telephone Encounter (Signed)
Pt called stating that she is needing a letter from the provider stating that she has been diagnosed with MS and that she has has had it since 1995. Pt is applying to get a Cooling Vest and they are needing this letter. Please advise.

## 2020-07-27 ENCOUNTER — Other Ambulatory Visit: Payer: Self-pay

## 2020-07-27 ENCOUNTER — Ambulatory Visit: Payer: PPO

## 2020-07-27 DIAGNOSIS — R2681 Unsteadiness on feet: Secondary | ICD-10-CM

## 2020-07-27 DIAGNOSIS — R2689 Other abnormalities of gait and mobility: Secondary | ICD-10-CM

## 2020-07-27 DIAGNOSIS — M6281 Muscle weakness (generalized): Secondary | ICD-10-CM

## 2020-07-27 DIAGNOSIS — G35 Multiple sclerosis: Secondary | ICD-10-CM | POA: Diagnosis not present

## 2020-07-27 NOTE — Therapy (Signed)
[place] MAIN [facility] SERVICES [place], [place], [address] Phone: [phone]   Fax:  [phone]  Physical Therapy Treatment Physical Therapy Progress Note   Dates of reporting period  06/10/2020  to   07/27/2020   Patient Details  Name: Lisa Williams MRN: [id number] Date of Birth: [birth year] Referring Provider (PT): Gurney Maxin, MD   Encounter Date: 07/27/2020   PT End of Session - 07/27/20 1228    Visit Number 80    Number of Visits 91    Date for PT Re-Evaluation 08/24/20    PT Start Time 1018    PT Stop Time 1100    PT Time Calculation (min) 42 min    Equipment Utilized During Treatment Gait belt;Other (comment)    Activity Tolerance Patient tolerated treatment well;Patient limited by fatigue    Behavior During Therapy [facility] for tasks assessed/performed           Past Medical History:  Diagnosis Date  . Abdominal pain, right upper quadrant   . Back pain   . Calculus of kidney 12/09/2013  . Chronic back pain    unspecified  . Chronic left shoulder pain 07/19/2015  . Complication of anesthesia   . Functional disorder of bladder    other  . Galactorrhea 11/26/2014   Chronic   . Hereditary and idiopathic neuropathy 08/19/2013  . HPV test positive   . Hypercholesteremia 08/19/2013  . Hypertension   . Incomplete bladder emptying   . Microscopic hematuria   . MS (multiple sclerosis) ([place])   . Muscle spasticity 05/21/2014  . Nonspecific findings on examination of urine    other  . Osteopenia   . PONV (postoperative nausea and vomiting)   . Status post laparoscopic supracervical hysterectomy 11/26/2014  . Tobacco user 11/26/2014  . Wrist fracture     Past Surgical History:  Procedure Laterality Date  . bilateral tubal ligation  1996  . BREAST CYST EXCISION Left 2002  . FRACTURE SURGERY    . KNEE SURGERY     right  . LAPAROSCOPIC SUPRACERVICAL HYSTERECTOMY  08/05/2013  . ORIF WRIST FRACTURE Left  01/17/2017   Procedure: OPEN REDUCTION INTERNAL FIXATION (ORIF) WRIST FRACTURE;  Surgeon: Lovell Sheehan, MD;  Location: [facility];  Service: Orthopedics;  Laterality: Left;  . RADIOLOGY WITH ANESTHESIA N/A 03/18/2020   Procedure: MRI WITH ANESTHESIA CERVICAL SPINE AND BRAIN  WITH AND WITHOUT CONTRAST;  Surgeon: Radiologist, Medication, MD;  Location: [place];  Service: Radiology;  Laterality: N/A;  . TUBAL LIGATION Bilateral   . VAGINAL HYSTERECTOMY  03/2006    There were no vitals filed for this visit.   Subjective Assessment - 07/27/20 1019    Subjective Pt reports no pain. Pt reports no changes since previous session or falls/near falls. Pt does report she feels overheated today.    Pertinent History Patient is previous patient of this therapist, was admitted to the hospital on 08/03/19 for MS exacerbation and then went to inpatient rehab on 08/08/19. Patient then received home health therapy and now is returning to outpatient PT. PMh includes HTN, SVT, neurogenic bowel, MS (1995), neuropathy, hx of wrist fx, HPV, Hypercholesteremia, adiposity, osteopenia, spasticity. She wears bilateral AFOs and uses a RW and/or wheelchair for mobility. She drives an adapted car.    Currently in Pain? No/denies          TREATMENT - reassessment of goals  5xSTS 37.5 sec use of BUEs (decreased)   Standing at walker  with 4" step; heel not fully off ground, but pt slightly able to lift R LE to tap side of step while holding onto RW with both hands, CGA  Transfer from [facility] to RW: pt pushes with LUE off WC with RUE on RW as cue for balance 1x, CGA  TUG: 82 seconds (decreased)  10MWT: deferred  FOTO 42 (decreased)   Walking to elevator to be performed future session   Seated:   -Hamstring stretch on B leg 2x30 seconds B LEs pt reports no pain  -GTB seated band-clam 1x15 (hips at ~90 degree); pt rates "easy"  Adduction ball squeeze 2x15: pt rates exercise as easy   Assessment: Pt goals reassessed today  as pt was able. Her FOTO score was slightly decreased to 42%. Pt testing affected by pt feeling overheated and not having her usual RW, likely impacting her strength with all tests. Pt saw decreases in all assessments performed (TUG, 5xSTS and functional tasks at [facility]). Due to pt not feeling well 10MWT and other ambulation goal testing deferred to future session. Other tests also to be reassessed future session when pt has her own walker and feeling better. Patient's condition has the potential to improve in response to therapy. Maximum improvement is yet to be obtained. The anticipated improvement is attainable and reasonable in a generally predictable time.  Patient reports she has performed better on these tasks in the past when feeling better. Pt will continue to benefit from further skilled physical therapy to maximize QOL, safety and function.   Plan - 07/27/20 1730    Clinical Impression Statement Pt goals reassessed today as pt was able. Her FOTO score was slightly decreased to 42%. Pt testing affected by pt feeling overheated and not having her usual RW, likely impacting her strength with all tests. Pt saw decreases in all assessments performed (TUG, 5xSTS and functional tasks at [facility]). Due to pt not feeling well 10MWT and other ambulation goal testing deferred to future session. Other tests also to be reassessed future session when pt has her own walker and feeling better. Patient's condition has the potential to improve in response to therapy. Maximum improvement is yet to be obtained. The anticipated improvement is attainable and reasonable in a generally predictable time.  Patient reports she has performed better on these tasks in the past when feeling better. Pt will continue to benefit from further skilled physical therapy to maximize QOL, safety and function.    Personal Factors and Comorbidities Age;Comorbidity 3+;Fitness;Past/Current Experience;Sex;Time since onset of injury/illness/exacerbation     Comorbidities HTN, SVT, neurogenic bowel, MS (1995), neuropathy, hx of wrist fx, HPV, Hypercholesteremia, adiposity, osteopenia, spasticity.    Examination-Activity Limitations Bathing;Bed Mobility;Bend;Carry;Continence;Dressing;Hygiene/Grooming;Stairs;Squat;Reach Overhead;Locomotion Level;Lift;Stand;Transfers    Examination-Participation Restrictions Church;Cleaning;Community Activity;Laundry;Volunteer;Shop;Meal Prep;Yard Work    [employer] Evolving/Moderate complexity    Rehab Potential Good    PT Frequency 2x / week    PT Duration 12 weeks    PT Treatment/Interventions ADLs/Self Care Home Management;Aquatic Therapy;Biofeedback;Cryotherapy;Electrical Stimulation;Iontophoresis 51m/ml Dexamethasone;Traction;Moist Heat;Ultrasound;DME Instruction;Gait training;Stair training;Functional mobility training;Therapeutic activities;Therapeutic exercise;Balance training;Neuromuscular re-education;Patient/family education;Orthotic Fit/Training;Wheelchair mobility training;Manual techniques;Compression bandaging;Passive range of motion;Dry needling;Energy conservation;Taping;Splinting;Vestibular    PT Next Visit Plan step negotiation, turning directions to the L and R; re-test goals    PT Home Exercise Plan maintain current HEP from HHPT    Consulted and Agree with Plan of Care Patient             PT Short Term Goals - 07/27/20 1023  PT SHORT TERM GOAL #1   Title Patient will be independent in home exercise program to improve strength/mobility for better functional independence with ADLs.    Baseline 5/19: HEP next session 6/22: HEP compliant 8/10 : HEP progressions compliant 8/31 HEP compliant 10/5: HEP compliant    Time 4    Period Weeks    Status Achieved    Target Date 01/13/20      PT SHORT TERM GOAL #2   Title Patient will perform a sit to stand with SUE support for increased ind. with transfers.    Baseline 5/19: heavy BUE support 6/22: requires one hand on  walker one hand on w/c 8/10: able to perform intermittently (some days can some days cannot) 8/31: attempt next session 10/5: BUE reliance, patient unable to complete 4 full stands. Attempt next session 1/25: able to perform from elevated surface only; 3/22 pt pushes through L arm off of wheelchair with R arm on RW for tactile cue/balance to perform transfer    Time 4    Period Weeks    Status Partially Met    Target Date 08/24/20             PT Long Term Goals - 07/27/20 0001      PT LONG TERM GOAL #1   Title Patient will increase FOTO score to equal to or greater than  53/100   to demonstrate statistically significant improvement in mobility and quality of life.    Baseline 8/31: 48.13% 7/27:  48.1% 6/22: 46.99% 5/18: 48/100 10/5: 49/100 11/2: 48%  12/30: 46.9% 1/25: 46.99%; 07/27/2020 FOTO 42%    Time 12    Period Weeks    Status Partially Met    Target Date 08/24/20      PT LONG TERM GOAL #2   Title Patient (< 53 years old) will complete five times sit to stand test in < 20 seconds indicating an increased LE strength and improved balance.    Baseline 7/27: 26.8 seconds with one partial stand 6/22: 40. 86 sec; unable to reach full stand last two attempts 5/19: 42 seconds 10/5: BUE reliance, patient unable to complete 4 full stands. Attempt next session 11/2: 23.71 seconds 12/30: 30.32 with one near LOB due to spasms 1/25: 27.11 seconds with one hand on RW one hand on chair; 37.5 seconds with use of BUEs    Time 12    Period Weeks    Status Partially Met    Target Date 08/24/20      PT LONG TERM GOAL #3   Title Patient will increase 10 meter walk test to <20 seconds as to improve gait speed for better community ambulation and to reduce fall risk.    Baseline 8/31: 35.1 seconds with RW 7/27: 36 seconds with RW 6/22: 36.1 seconds 5/19: 42 seconds 10/5: 31.9 seconds with RW 11/2: 29.9 seconds with RW 12/30: deferred due to spasm 1/25: defer to next session 1/27: 29 seconds with RW;  07/27/2020 deferred    Time 12    Period Weeks    Status Partially Met    Target Date 08/24/20      PT LONG TERM GOAL #4   Title Patient will reduce timed up and go to <11 seconds to reduce fall risk and demonstrate improved transfer/gait ability.    Baseline 8/31: 50.8 seconds with RW 7/27: 43.65 6/22: 45.4 seconds 5/19: 47 seconds 10/5: 38.5 seconds with RW 11/2: 35.72 seconds with RW 12/30: 43.5 seconds with wheels wet. 1/25:  deferred 1/27: 38.9 seconds; 07/27/2020 82 seconds with RW, CGA    Time 12    Period Weeks    Status Partially Met    Target Date 08/24/20      PT LONG TERM GOAL #5   Title Patient will ambulate from physical therapy gym to elevator down the hall, ride elevator to first floor, and walk to valet with RW and chair follow for increased capacity for mobility and ind.    Baseline 8/31: able to open door ~50 ft with one rest break 8/10: unable to ambulate to elevator without rest break 9/30: able to ambulate to elevator with 3 rest breaks 1/25: 181 ft with 2 seated rest break (to elevator); 07/27/2020 deferred    Time 12    Period Weeks    Status Partially Met    Target Date 08/24/20      PT LONG TERM GOAL #6   Title Patient will be able to stand with RW and lift one leg onto 4" step for carryover to negotiation of steps at home.    Baseline 11/2: unable to perform 1/25: R foot 0.75 inch lift; 07/27/2020 heel not fully off ground, but pt slightly able to lift R LE to tap side of step while holding onto RW with both hands    Time 12    Period Weeks    Status Partially Met    Target Date 08/24/20            Patient will benefit from skilled therapeutic intervention in order to improve the following deficits and impairments:  Abnormal gait,Decreased activity tolerance,Decreased balance,Decreased coordination,Decreased endurance,Decreased mobility,Decreased range of motion,Decreased strength,Difficulty walking,Impaired perceived functional ability,Impaired  sensation,Impaired tone,Impaired UE functional use,Improper body mechanics,Postural dysfunction,Cardiopulmonary status limiting activity,Impaired flexibility,Increased muscle spasms  Visit Diagnosis: Muscle weakness (generalized)  Other abnormalities of gait and mobility  Unsteadiness on feet     Problem List Patient Active Problem List   Diagnosis Date Noted  . Hypoalbuminemia due to protein-calorie malnutrition ([place])   . Neurogenic bowel   . Neurogenic bladder   . Labile blood pressure   . Neuropathic pain   . Abscess of female pelvis   . SVT (supraventricular tachycardia) ([place])   . Radial styloid tenosynovitis 03/12/2018  . Wheelchair confinement 02/27/2018  . Localized osteoporosis with current pathological fracture with routine healing 01/19/2017  . Wrist fracture 01/16/2017  . Sprain of ankle 03/23/2016  . Closed fracture of lateral malleolus 03/16/2016  . Health care maintenance 01/24/2016  . Blood pressure elevated without history of HTN 10/25/2015  . Essential hypertension 10/25/2015  . Multiple sclerosis ([place]) 10/02/2015  . Chronic left shoulder pain 07/19/2015  . Multiple sclerosis exacerbation ([place]) 07/14/2015  . MS (multiple sclerosis) ([place]) 11/26/2014  . Increased body mass index 11/26/2014  . HPV test positive 11/26/2014  . Status post laparoscopic supracervical hysterectomy 11/26/2014  . Galactorrhea 11/26/2014  . Back ache 05/21/2014  . Adiposity 05/21/2014  . Disordered sleep 05/21/2014  . Muscle spasticity 05/21/2014  . Spasticity 05/21/2014  . Calculus of kidney 12/09/2013  . Renal colic 25/95/6387  . Hypercholesteremia 08/19/2013  . Hereditary and idiopathic neuropathy 08/19/2013  . Hypercholesterolemia without hypertriglyceridemia 08/19/2013  . Bladder infection, chronic 07/25/2012  . Disorder of bladder function 07/25/2012  . Incomplete bladder emptying 07/25/2012  . Microscopic hematuria 07/25/2012  . Right upper quadrant pain 07/25/2012    Ricard Dillon PT, DPT 07/27/2020, 5:32 PM  [place] MAIN [facility] SERVICES [place]  [place], [place], [address] Phone: [phone]   Fax:  [phone]  Name: Aften Lipsey Flinn MRN: [id number] Date of Birth: 1960/05/30

## 2020-07-29 ENCOUNTER — Ambulatory Visit: Payer: PPO

## 2020-07-29 ENCOUNTER — Other Ambulatory Visit: Payer: Self-pay

## 2020-07-29 DIAGNOSIS — G35 Multiple sclerosis: Secondary | ICD-10-CM | POA: Diagnosis not present

## 2020-07-29 DIAGNOSIS — R2689 Other abnormalities of gait and mobility: Secondary | ICD-10-CM

## 2020-07-29 DIAGNOSIS — R2681 Unsteadiness on feet: Secondary | ICD-10-CM

## 2020-07-29 DIAGNOSIS — M6281 Muscle weakness (generalized): Secondary | ICD-10-CM

## 2020-07-29 NOTE — Therapy (Signed)
[place] MAIN [facility] SERVICES [address] [place], [place], [address] Phone: [phone]   Fax:  [phone]  Physical Therapy Treatment  Patient Details  Name: Lisa Williams MRN: [id number] Date of Birth: 1960/05/09 Referring Provider (PT): Gurney Maxin, MD   Encounter Date: 07/29/2020   PT End of Session - 07/30/20 0803    Visit Number 81    Number of Visits 91    Date for PT Re-Evaluation 08/24/20    PT Start Time 1016    PT Stop Time 1059    PT Time Calculation (min) 43 min    Equipment Utilized During Treatment Gait belt;Other (comment)    Activity Tolerance Patient tolerated treatment well;Patient limited by fatigue    Behavior During Therapy [facility] for tasks assessed/performed           Past Medical History:  Diagnosis Date  . Abdominal pain, right upper quadrant   . Back pain   . Calculus of kidney 12/09/2013  . Chronic back pain    unspecified  . Chronic left shoulder pain 07/19/2015  . Complication of anesthesia   . Functional disorder of bladder    other  . Galactorrhea 11/26/2014   Chronic   . Hereditary and idiopathic neuropathy 08/19/2013  . HPV test positive   . Hypercholesteremia 08/19/2013  . Hypertension   . Incomplete bladder emptying   . Microscopic hematuria   . MS (multiple sclerosis) ([place])   . Muscle spasticity 05/21/2014  . Nonspecific findings on examination of urine    other  . Osteopenia   . PONV (postoperative nausea and vomiting)   . Status post laparoscopic supracervical hysterectomy 11/26/2014  . Tobacco user 11/26/2014  . Wrist fracture     Past Surgical History:  Procedure Laterality Date  . bilateral tubal ligation  1996  . BREAST CYST EXCISION Left 2002  . FRACTURE SURGERY    . KNEE SURGERY     right  . LAPAROSCOPIC SUPRACERVICAL HYSTERECTOMY  08/05/2013  . ORIF WRIST FRACTURE Left 01/17/2017   Procedure: OPEN REDUCTION INTERNAL FIXATION (ORIF) WRIST FRACTURE;  Surgeon:  Lovell Sheehan, MD;  Location: [facility];  Service: Orthopedics;  Laterality: Left;  . RADIOLOGY WITH ANESTHESIA N/A 03/18/2020   Procedure: MRI WITH ANESTHESIA CERVICAL SPINE AND BRAIN  WITH AND WITHOUT CONTRAST;  Surgeon: Radiologist, Medication, MD;  Location: [place];  Service: Radiology;  Laterality: N/A;  . TUBAL LIGATION Bilateral   . VAGINAL HYSTERECTOMY  03/2006    There were no vitals filed for this visit.   Subjective Assessment - 07/30/20 0803    Subjective Pt says she feels better today. No pain or other changes reported.    Pertinent History Patient is previous patient of this therapist, was admitted to the hospital on 08/03/19 for MS exacerbation and then went to inpatient rehab on 08/08/19. Patient then received home health therapy and now is returning to outpatient PT. PMh includes HTN, SVT, neurogenic bowel, MS (1995), neuropathy, hx of wrist fx, HPV, Hypercholesteremia, adiposity, osteopenia, spasticity. She wears bilateral AFOs and uses a RW and/or wheelchair for mobility. She drives an adapted car.    Currently in Pain? No/denies             PT Short Term Goals - 07/27/20 1023      PT SHORT TERM GOAL #1   Title Patient will be independent in home exercise program to improve strength/mobility for better functional independence with ADLs.    Baseline 5/19:  HEP next session 6/22: HEP compliant 8/10 : HEP progressions compliant 8/31 HEP compliant 10/5: HEP compliant    Time 4    Period Weeks    Status Achieved    Target Date 01/13/20      PT SHORT TERM GOAL #2   Title Patient will perform a sit to stand with SUE support for increased ind. with transfers.    Baseline 5/19: heavy BUE support 6/22: requires one hand on walker one hand on w/c 8/10: able to perform intermittently (some days can some days cannot) 8/31: attempt next session 10/5: BUE reliance, patient unable to complete 4 full stands. Attempt next session 1/25: able to perform from elevated surface only; 3/22 pt  pushes through L arm off of wheelchair with R arm on RW for tactile cue/balance to perform transfer    Time 4    Period Weeks    Status Partially Met    Target Date 08/24/20             PT Long Term Goals - 07/30/20 0001      PT LONG TERM GOAL #1   Title Patient will increase FOTO score to equal to or greater than  53/100   to demonstrate statistically significant improvement in mobility and quality of life.    Baseline 8/31: 48.13% 7/27:  48.1% 6/22: 46.99% 5/18: 48/100 10/5: 49/100 11/2: 48%  12/30: 46.9% 1/25: 46.99%; 07/27/2020 FOTO 42%    Time 12    Period Weeks    Status Partially Met    Target Date 08/24/20      PT LONG TERM GOAL #2   Title Patient (< 95 years old) will complete five times sit to stand test in < 20 seconds indicating an increased LE strength and improved balance.    Baseline 7/27: 26.8 seconds with one partial stand 6/22: 40. 86 sec; unable to reach full stand last two attempts 5/19: 42 seconds 10/5: BUE reliance, patient unable to complete 4 full stands. Attempt next session 11/2: 23.71 seconds 12/30: 30.32 with one near LOB due to spasms 1/25: 27.11 seconds with one hand on RW one hand on chair; 37.5 seconds with use of BUEs    Time 12    Period Weeks    Status Partially Met    Target Date 08/24/20      PT LONG TERM GOAL #3   Title Patient will increase 10 meter walk test to <20 seconds as to improve gait speed for better community ambulation and to reduce fall risk.    Baseline 8/31: 35.1 seconds with RW 7/27: 36 seconds with RW 6/22: 36.1 seconds 5/19: 42 seconds 10/5: 31.9 seconds with RW 11/2: 29.9 seconds with RW 12/30: deferred due to spasm 1/25: defer to next session 1/27: 29 seconds with RW; 07/27/2020 deferred; 07/29/2020 41.5 sec (0.24 m/s)    Time 12    Period Weeks    Status Partially Met    Target Date 08/24/20      PT LONG TERM GOAL #4   Title Patient will reduce timed up and go to <11 seconds to reduce fall risk and demonstrate improved  transfer/gait ability.    Baseline 8/31: 50.8 seconds with RW 7/27: 43.65 6/22: 45.4 seconds 5/19: 47 seconds 10/5: 38.5 seconds with RW 11/2: 35.72 seconds with RW 12/30: 43.5 seconds with wheels wet. 1/25: deferred 1/27: 38.9 seconds; 07/27/2020 82 seconds with RW, CGA; 07/29/2020 44 seconds    Time 12    Period Weeks  Status Partially Met    Target Date 08/24/20      PT LONG TERM GOAL #5   Title Patient will ambulate from physical therapy gym to elevator down the hall, ride elevator to first floor, and walk to valet with RW and chair follow for increased capacity for mobility and ind.    Baseline 8/31: able to open door ~50 ft with one rest break 8/10: unable to ambulate to elevator without rest break 9/30: able to ambulate to elevator with 3 rest breaks 1/25: 181 ft with 2 seated rest break (to elevator); 07/27/2020 deferred    Time 12    Period Weeks    Status Partially Met    Target Date 08/24/20      PT LONG TERM GOAL #6   Title Patient will be able to stand with RW and lift one leg onto 4" step for carryover to negotiation of steps at home.    Baseline 11/2: unable to perform 1/25: R foot 0.75 inch lift; 07/27/2020 heel not fully off ground, but pt slightly able to lift R LE to tap side of step while holding onto RW with both hands    Time 12    Period Weeks    Status Partially Met    Target Date 08/24/20          TREATMENT   Re-tested the following:  TUG  Trial 1: 55.2 sec Trial 2: 44 sec   10MWT:  41.5 sec; 0.24 m/s  Seated:    Hamstring stretch on PT leg 2x30 seconds  Adduction ball squeeze 15x  Middle delt raises 15x; pt rates exercise as "hard"  Single arm raise with opp UE in straight arm abduction 12x each UE; pt rates exercise as "medium"  Posterior delt raise 15x; pt rates exercise "medium"  Lateral flexion and abduction half circle arcs 12x each directoin forward and backward UE   Seated scapular squeezes 2x15  Stability ball rollouts forward and  side-to-side 10x each  Upper trap stretch 2x30 sec  Green ball core raises 2x12; pt rates as "medium"  No pain reported with exercises  Education was provided throughout in the form of demonstration, VC/TC to facilitate movement at target joints and correct muscle activation with exercises.    Plan - 07/30/20 0800    Clinical Impression Statement Pt almost completed all testing from previous progress assessment. She preformed two trials of the TUG, of which her best score was 44 seconds. Pt completed 10MWT with a gait speed of 0.24 m/s with RW and WC follow, CGA. The patient was too fatigued after testing to performing remaining ambulation goal, but was able to complete multiple seated therex. The pt will benefit from further skilled therapy to maximize mobility, gait ability, and LE strength to improve QOL.    Personal Factors and Comorbidities Age;Comorbidity 3+;Fitness;Past/Current Experience;Sex;Time since onset of injury/illness/exacerbation    Comorbidities HTN, SVT, neurogenic bowel, MS (1995), neuropathy, hx of wrist fx, HPV, Hypercholesteremia, adiposity, osteopenia, spasticity.    Examination-Activity Limitations Bathing;Bed Mobility;Bend;Carry;Continence;Dressing;Hygiene/Grooming;Stairs;Squat;Reach Overhead;Locomotion Level;Lift;Stand;Transfers    Examination-Participation Restrictions Church;Cleaning;Community Activity;Laundry;Volunteer;Shop;Meal Prep;Yard Work    [employer] Evolving/Moderate complexity    Rehab Potential Good    PT Frequency 2x / week    PT Duration 12 weeks    PT Treatment/Interventions ADLs/Self Care Home Management;Aquatic Therapy;Biofeedback;Cryotherapy;Electrical Stimulation;Iontophoresis 41m/ml Dexamethasone;Traction;Moist Heat;Ultrasound;DME Instruction;Gait training;Stair training;Functional mobility training;Therapeutic activities;Therapeutic exercise;Balance training;Neuromuscular re-education;Patient/family education;Orthotic  Fit/Training;Wheelchair mobility training;Manual techniques;Compression bandaging;Passive range of motion;Dry needling;Energy conservation;Taping;Splinting;Vestibular    PT Next Visit  Plan step negotiation, turning directions to the L and R; re-test goals    PT Home Exercise Plan maintain current HEP from [place] and Agree with Plan of Care Patient            Patient will benefit from skilled therapeutic intervention in order to improve the following deficits and impairments:  Abnormal gait,Decreased activity tolerance,Decreased balance,Decreased coordination,Decreased endurance,Decreased mobility,Decreased range of motion,Decreased strength,Difficulty walking,Impaired perceived functional ability,Impaired sensation,Impaired tone,Impaired UE functional use,Improper body mechanics,Postural dysfunction,Cardiopulmonary status limiting activity,Impaired flexibility,Increased muscle spasms  Visit Diagnosis: Muscle weakness (generalized)  Other abnormalities of gait and mobility  Unsteadiness on feet     Problem List Patient Active Problem List   Diagnosis Date Noted  . Hypoalbuminemia due to protein-calorie malnutrition ([place])   . Neurogenic bowel   . Neurogenic bladder   . Labile blood pressure   . Neuropathic pain   . Abscess of female pelvis   . SVT (supraventricular tachycardia) ([place])   . Radial styloid tenosynovitis 03/12/2018  . Wheelchair confinement 02/27/2018  . Localized osteoporosis with current pathological fracture with routine healing 01/19/2017  . Wrist fracture 01/16/2017  . Sprain of ankle 03/23/2016  . Closed fracture of lateral malleolus 03/16/2016  . Health care maintenance 01/24/2016  . Blood pressure elevated without history of HTN 10/25/2015  . Essential hypertension 10/25/2015  . Multiple sclerosis ([place]) 10/02/2015  . Chronic left shoulder pain 07/19/2015  . Multiple sclerosis exacerbation ([place]) 07/14/2015  . MS (multiple sclerosis) ([place])  11/26/2014  . Increased body mass index 11/26/2014  . HPV test positive 11/26/2014  . Status post laparoscopic supracervical hysterectomy 11/26/2014  . Galactorrhea 11/26/2014  . Back ache 05/21/2014  . Adiposity 05/21/2014  . Disordered sleep 05/21/2014  . Muscle spasticity 05/21/2014  . Spasticity 05/21/2014  . Calculus of kidney 12/09/2013  . Renal colic 78/24/2353  . Hypercholesteremia 08/19/2013  . Hereditary and idiopathic neuropathy 08/19/2013  . Hypercholesterolemia without hypertriglyceridemia 08/19/2013  . Bladder infection, chronic 07/25/2012  . Disorder of bladder function 07/25/2012  . Incomplete bladder emptying 07/25/2012  . Microscopic hematuria 07/25/2012  . Right upper quadrant pain 07/25/2012   Ricard Dillon PT, DPT 07/30/2020, 8:05 AM  [place] MAIN [facility] SERVICES [address] [place], [place], [address] Phone: [phone]   Fax:  [phone]  Name: Etheline Geppert [place] MRN: [id number] Date of Birth: 08-08-1960

## 2020-08-03 ENCOUNTER — Other Ambulatory Visit: Payer: Self-pay

## 2020-08-03 ENCOUNTER — Ambulatory Visit: Payer: PPO

## 2020-08-03 DIAGNOSIS — R2681 Unsteadiness on feet: Secondary | ICD-10-CM

## 2020-08-03 DIAGNOSIS — M6281 Muscle weakness (generalized): Secondary | ICD-10-CM

## 2020-08-03 DIAGNOSIS — R2689 Other abnormalities of gait and mobility: Secondary | ICD-10-CM

## 2020-08-03 DIAGNOSIS — G35 Multiple sclerosis: Secondary | ICD-10-CM | POA: Diagnosis not present

## 2020-08-03 NOTE — Therapy (Signed)
[place] MAIN [facility] SERVICES [address] [place], [place], [address] Phone: [phone]   Fax:  [phone]  Physical Therapy Treatment  Patient Details  Name: Lisa Williams MRN: [id number] Date of Birth: 12-27-1960 Referring Provider (PT): Gurney Maxin, MD   Encounter Date: 08/03/2020   PT End of Session - 08/03/20 1025    Visit Number [id number]    Number of Visits 91    Date for PT Re-Evaluation 08/24/20    PT Start Time 4665    PT Stop Time 1100    PT Time Calculation (min) 46 min    Equipment Utilized During Treatment Gait belt;Other (comment)    Activity Tolerance Patient tolerated treatment well;Patient limited by fatigue    Behavior During Therapy [facility] for tasks assessed/performed           Past Medical History:  Diagnosis Date  . Abdominal pain, right upper quadrant   . Back pain   . Calculus of kidney 12/09/2013  . Chronic back pain    unspecified  . Chronic left shoulder pain 07/19/2015  . Complication of anesthesia   . Functional disorder of bladder    other  . Galactorrhea 11/26/2014   Chronic   . Hereditary and idiopathic neuropathy 08/19/2013  . HPV test positive   . Hypercholesteremia 08/19/2013  . Hypertension   . Incomplete bladder emptying   . Microscopic hematuria   . MS (multiple sclerosis) ([place])   . Muscle spasticity 05/21/2014  . Nonspecific findings on examination of urine    other  . Osteopenia   . PONV (postoperative nausea and vomiting)   . Status post laparoscopic supracervical hysterectomy 11/26/2014  . Tobacco user 11/26/2014  . Wrist fracture     Past Surgical History:  Procedure Laterality Date  . bilateral tubal ligation  1996  . BREAST CYST EXCISION Left 2002  . FRACTURE SURGERY    . KNEE SURGERY     right  . LAPAROSCOPIC SUPRACERVICAL HYSTERECTOMY  08/05/2013  . ORIF WRIST FRACTURE Left 01/17/2017   Procedure: OPEN REDUCTION INTERNAL FIXATION (ORIF) WRIST FRACTURE;  Surgeon:  Lovell Sheehan, MD;  Location: [facility];  Service: Orthopedics;  Laterality: Left;  . RADIOLOGY WITH ANESTHESIA N/A 03/18/2020   Procedure: MRI WITH ANESTHESIA CERVICAL SPINE AND BRAIN  WITH AND WITHOUT CONTRAST;  Surgeon: Radiologist, Medication, MD;  Location: [place];  Service: Radiology;  Laterality: N/A;  . TUBAL LIGATION Bilateral   . VAGINAL HYSTERECTOMY  03/2006    There were no vitals filed for this visit.   Subjective Assessment - 08/03/20 1018    Subjective Patient reports no falls or LOB since last session. Has been compliant with HEP. Having light back pain from the cold, has been stiff with getting up this morning.    Pertinent History Patient is previous patient of this therapist, was admitted to the hospital on 08/03/19 for MS exacerbation and then went to inpatient rehab on 08/08/19. Patient then received home health therapy and now is returning to outpatient PT. PMh includes HTN, SVT, neurogenic bowel, MS (1995), neuropathy, hx of wrist fx, HPV, Hypercholesteremia, adiposity, osteopenia, spasticity. She wears bilateral AFOs and uses a RW and/or wheelchair for mobility. She drives an adapted car.    Currently in Pain? No/denies              in // bars:  -ambulate forwards/backwards 3 laps( 6 lengths of // bars) cues for weight shift and picking up LE's for foot clearance,  heavy UE support. : patient complaints of heating up afterwards.    -Standing weight shifts with hip/knee flexion x10 each side; UE support focus on hip shift rather than pull with arm; terminated after 8x each LE ;more challenged with LE movement this session.     Seated:   silver swiss ball forward rollout 10x each direction: forward/backward lateral R/lateral L, lateral L lateral R for 30 total with 5 second holds Green swiss ball overhead raises with back unsupported 15x ; 2 sets Green swiss ball lateral twists with back unsupported 10x; 2 sets; cues for head to follow ball rotation.  LAQ modified 10x  each LE; RLE AAROM with PT assistance, 2 sets each LE ; decreased motion with fatigue.  GTB around knee: abduction 10x, adduction 10x against PT resistance   Seated boxing: cues for sequencing, body mechanics, and pace/rhythm for functional contraction and timing of muscle recruitment: -Cross body punches to mitts outside BOS on PT hands with no back support for focused core stabilization with crossing midline  60 seconds  -Cross body punch with secondary combination of elbow for full cross body rotation to PT mitt for trunk stability, coordination, and cardiovascular challenge x 60 seconds -Upper cut to mitts in PT hands for core activation without back support with perturbations 60 seconds    Ice pack donned throughout session to minimize exacerbation of MS symptoms in order to perform interventions.      Pt educated throughout session about proper posture and technique with exercises. Improved exercise technique, movement at target joints, use of target muscles after min to mod verbal, visual, tactile cues     Patient has more frequent episodes of "heating up" requiring extended rest breaks throughout session to return internal temperature to normal temperature. Standing interventions terminated early due to increased temperature and LE difficulties. She tolerates seated interventions well. The patient would benefit from continued skilled PT intervention to maximize QOL, safety, and function               PT Education - 08/03/20 1025    Education provided Yes    Education Details exercise technique, body mechanics    Person(s) Educated Patient    Methods Explanation;Demonstration;Tactile cues;Verbal cues    Comprehension Verbalized understanding;Returned demonstration;Verbal cues required;Tactile cues required            PT Short Term Goals - 07/27/20 1023      PT SHORT TERM GOAL #1   Title Patient will be independent in home exercise program to improve  strength/mobility for better functional independence with ADLs.    Baseline 5/19: HEP next session 6/22: HEP compliant 8/10 : HEP progressions compliant 8/31 HEP compliant 10/5: HEP compliant    Time 4    Period Weeks    Status Achieved    Target Date 01/13/20      PT SHORT TERM GOAL #2   Title Patient will perform a sit to stand with SUE support for increased ind. with transfers.    Baseline 5/19: heavy BUE support 6/22: requires one hand on walker one hand on w/c 8/10: able to perform intermittently (some days can some days cannot) 8/31: attempt next session 10/5: BUE reliance, patient unable to complete 4 full stands. Attempt next session 1/25: able to perform from elevated surface only; 3/22 pt pushes through L arm off of wheelchair with R arm on RW for tactile cue/balance to perform transfer    Time 4    Period Weeks    Status  Partially Met    Target Date 08/24/20             PT Long Term Goals - 07/30/20 0001      PT LONG TERM GOAL #1   Title Patient will increase FOTO score to equal to or greater than  53/100   to demonstrate statistically significant improvement in mobility and quality of life.    Baseline 8/31: 48.13% 7/27:  48.1% 6/22: 46.99% 5/18: 48/100 10/5: 49/100 11/2: 48%  12/30: 46.9% 1/25: 46.99%; 07/27/2020 FOTO 42%    Time 12    Period Weeks    Status Partially Met    Target Date 08/24/20      PT LONG TERM GOAL #2   Title Patient (< 55 years old) will complete five times sit to stand test in < 20 seconds indicating an increased LE strength and improved balance.    Baseline 7/27: 26.8 seconds with one partial stand 6/22: 40. 86 sec; unable to reach full stand last two attempts 5/19: 42 seconds 10/5: BUE reliance, patient unable to complete 4 full stands. Attempt next session 11/2: 23.71 seconds 12/30: 30.32 with one near LOB due to spasms 1/25: 27.11 seconds with one hand on RW one hand on chair; 37.5 seconds with use of BUEs    Time 12    Period Weeks    Status  Partially Met    Target Date 08/24/20      PT LONG TERM GOAL #3   Title Patient will increase 10 meter walk test to <20 seconds as to improve gait speed for better community ambulation and to reduce fall risk.    Baseline 8/31: 35.1 seconds with RW 7/27: 36 seconds with RW 6/22: 36.1 seconds 5/19: 42 seconds 10/5: 31.9 seconds with RW 11/2: 29.9 seconds with RW 12/30: deferred due to spasm 1/25: defer to next session 1/27: 29 seconds with RW; 07/27/2020 deferred; 07/29/2020 41.5 sec (0.24 m/s)    Time 12    Period Weeks    Status Partially Met    Target Date 08/24/20      PT LONG TERM GOAL #4   Title Patient will reduce timed up and go to <11 seconds to reduce fall risk and demonstrate improved transfer/gait ability.    Baseline 8/31: 50.8 seconds with RW 7/27: 43.65 6/22: 45.4 seconds 5/19: 47 seconds 10/5: 38.5 seconds with RW 11/2: 35.72 seconds with RW 12/30: 43.5 seconds with wheels wet. 1/25: deferred 1/27: 38.9 seconds; 07/27/2020 82 seconds with RW, CGA; 07/29/2020 44 seconds    Time 12    Period Weeks    Status Partially Met    Target Date 08/24/20      PT LONG TERM GOAL #5   Title Patient will ambulate from physical therapy gym to elevator down the hall, ride elevator to first floor, and walk to valet with RW and chair follow for increased capacity for mobility and ind.    Baseline 8/31: able to open door ~50 ft with one rest break 8/10: unable to ambulate to elevator without rest break 9/30: able to ambulate to elevator with 3 rest breaks 1/25: 181 ft with 2 seated rest break (to elevator); 07/27/2020 deferred    Time 12    Period Weeks    Status Partially Met    Target Date 08/24/20      PT LONG TERM GOAL #6   Title Patient will be able to stand with RW and lift one leg onto 4" step for carryover to  negotiation of steps at home.    Baseline 11/2: unable to perform 1/25: R foot 0.75 inch lift; 07/27/2020 heel not fully off ground, but pt slightly able to lift R LE to tap side of  step while holding onto RW with both hands    Time 12    Period Weeks    Status Partially Met    Target Date 08/24/20                 Plan - 08/03/20 1034    Clinical Impression Statement Patient has more frequent episodes of "heating up" requiring extended rest breaks throughout session to return internal temperature to normal temperature. Standing interventions terminated early due to increased temperature and LE difficulties. She tolerates seated interventions well. The patient would benefit from continued skilled PT intervention to maximize QOL, safety, and function    Personal Factors and Comorbidities Age;Comorbidity 3+;Fitness;Past/Current Experience;Sex;Time since onset of injury/illness/exacerbation    Comorbidities HTN, SVT, neurogenic bowel, MS (1995), neuropathy, hx of wrist fx, HPV, Hypercholesteremia, adiposity, osteopenia, spasticity.    Examination-Activity Limitations Bathing;Bed Mobility;Bend;Carry;Continence;Dressing;Hygiene/Grooming;Stairs;Squat;Reach Overhead;Locomotion Level;Lift;Stand;Transfers    Examination-Participation Restrictions Church;Cleaning;Community Activity;Laundry;Volunteer;Shop;Meal Prep;Yard Work    [employer] Evolving/Moderate complexity    Rehab Potential Good    PT Frequency 2x / week    PT Duration 12 weeks    PT Treatment/Interventions ADLs/Self Care Home Management;Aquatic Therapy;Biofeedback;Cryotherapy;Electrical Stimulation;Iontophoresis 75m/ml Dexamethasone;Traction;Moist Heat;Ultrasound;DME Instruction;Gait training;Stair training;Functional mobility training;Therapeutic activities;Therapeutic exercise;Balance training;Neuromuscular re-education;Patient/family education;Orthotic Fit/Training;Wheelchair mobility training;Manual techniques;Compression bandaging;Passive range of motion;Dry needling;Energy conservation;Taping;Splinting;Vestibular    PT Next Visit Plan step negotiation, turning directions to the L and R;  re-test goals    PT Home Exercise Plan maintain current HEP from [facility] Agree with Plan of Care Patient           Patient will benefit from skilled therapeutic intervention in order to improve the following deficits and impairments:  Abnormal gait,Decreased activity tolerance,Decreased balance,Decreased coordination,Decreased endurance,Decreased mobility,Decreased range of motion,Decreased strength,Difficulty walking,Impaired perceived functional ability,Impaired sensation,Impaired tone,Impaired UE functional use,Improper body mechanics,Postural dysfunction,Cardiopulmonary status limiting activity,Impaired flexibility,Increased muscle spasms  Visit Diagnosis: Muscle weakness (generalized)  Other abnormalities of gait and mobility  Unsteadiness on feet     Problem List Patient Active Problem List   Diagnosis Date Noted  . Hypoalbuminemia due to protein-calorie malnutrition ([place]   . Neurogenic bowel   . Neurogenic bladder   . Labile blood pressure   . Neuropathic pain   . Abscess of female pelvis   . SVT (supraventricular tachycardia) ([place]   . Radial styloid tenosynovitis 03/12/2018  . Wheelchair confinement 02/27/2018  . Localized osteoporosis with current pathological fracture with routine healing 01/19/2017  . Wrist fracture 01/16/2017  . Sprain of ankle 03/23/2016  . Closed fracture of lateral malleolus 03/16/2016  . Health care maintenance 01/24/2016  . Blood pressure elevated without history of HTN 10/25/2015  . Essential hypertension 10/25/2015  . Multiple sclerosis ([place] 10/02/2015  . Chronic left shoulder pain 07/19/2015  . [place] sclerosis exacerbation ([place] 07/14/2015  . MS (multiple sclerosis) ([place] 11/26/2014  . Increased body mass index 11/26/2014  . HPV test positive 11/26/2014  . Status post laparoscopic supracervical hysterectomy 11/26/2014  . Galactorrhea 11/26/2014  . Back ache 05/21/2014  . Adiposity 05/21/2014  . Disordered sleep  05/21/2014  . Muscle spasticity 05/21/2014  . Spasticity 05/21/2014  . Calculus of kidney 12/09/2013  . Renal colic 050/93/2671 . Hypercholesteremia 08/19/2013  . Hereditary and idiopathic neuropathy 08/19/2013  . Hypercholesterolemia  without hypertriglyceridemia 08/19/2013  . Bladder infection, chronic 07/25/2012  . Disorder of bladder function 07/25/2012  . Incomplete bladder emptying 07/25/2012  . Microscopic hematuria 07/25/2012  . Right upper quadrant pain 07/25/2012   Janna Arch, PT, DPT   08/03/2020, 11:01 AM  [place] MAIN [facility] SERVICES [address] [place], [place], [address] Phone: [phone]   Fax:  [phone]  Name: Lisa Williams MRN: [id number] Date of Birth: 1961-02-24

## 2020-08-05 ENCOUNTER — Ambulatory Visit: Payer: PPO

## 2020-08-05 ENCOUNTER — Other Ambulatory Visit: Payer: Self-pay

## 2020-08-05 ENCOUNTER — Telehealth: Payer: Self-pay | Admitting: Neurology

## 2020-08-05 DIAGNOSIS — M6281 Muscle weakness (generalized): Secondary | ICD-10-CM

## 2020-08-05 DIAGNOSIS — G35 Multiple sclerosis: Secondary | ICD-10-CM | POA: Diagnosis not present

## 2020-08-05 DIAGNOSIS — R2689 Other abnormalities of gait and mobility: Secondary | ICD-10-CM

## 2020-08-05 DIAGNOSIS — Z5181 Encounter for therapeutic drug level monitoring: Secondary | ICD-10-CM

## 2020-08-05 DIAGNOSIS — R2681 Unsteadiness on feet: Secondary | ICD-10-CM

## 2020-08-05 MED ORDER — PREDNISONE 5 MG PO TABS: ORAL_TABLET | ORAL | 0 refills | Status: DC

## 2020-08-05 NOTE — Telephone Encounter (Signed)
Pt called stating that she just got out her PT appt and the therapist wanted her to inform the provider that she is starting to feel numbness from her R ankle up to her thigh. Pt states that she has started to drag her R leg something she has not done in quite a while. Pt states she is taking her Mayzent and is needing to be advised.

## 2020-08-05 NOTE — Telephone Encounter (Signed)
The patient is having some numbness in the right leg up to the midthigh level without significant weakness or agit instability.  We will check blood work, the patient wishes to have the blood work request faxed to [facility] in [place].

## 2020-08-05 NOTE — Addendum Note (Signed)
Addended by: Tamera Stands D on: 08/05/2020 02:17 PM   Modules accepted: Orders

## 2020-08-05 NOTE — Therapy (Signed)
[place] MAIN [facility] SERVICES [address] [place], [place], [address] Phone: [phone]   Fax:  [phone]  Physical Therapy Treatment  Patient Details  Name: Lisa Williams MRN: [id number] Date of Birth: 02-Feb-1961 Referring Provider (PT): Gurney Maxin, MD   Encounter Date: 08/05/2020   PT End of Session - 08/05/20 1006    Visit Number 17    Number of Visits 91    Date for PT Re-Evaluation 08/24/20    PT Start Time 1013    PT Stop Time 1058    PT Time Calculation (min) 45 min    Equipment Utilized During Treatment Gait belt;Other (comment)    Activity Tolerance Patient tolerated treatment well;Patient limited by fatigue    Behavior During Therapy [facility] for tasks assessed/performed           Past Medical History:  Diagnosis Date  . Abdominal pain, right upper quadrant   . Back pain   . Calculus of kidney 12/09/2013  . Chronic back pain    unspecified  . Chronic left shoulder pain 07/19/2015  . Complication of anesthesia   . Functional disorder of bladder    other  . Galactorrhea 11/26/2014   Chronic   . Hereditary and idiopathic neuropathy 08/19/2013  . HPV test positive   . Hypercholesteremia 08/19/2013  . Hypertension   . Incomplete bladder emptying   . Microscopic hematuria   . MS (multiple sclerosis) ([place])   . Muscle spasticity 05/21/2014  . Nonspecific findings on examination of urine    other  . Osteopenia   . PONV (postoperative nausea and vomiting)   . Status post laparoscopic supracervical hysterectomy 11/26/2014  . Tobacco user 11/26/2014  . Wrist fracture     Past Surgical History:  Procedure Laterality Date  . bilateral tubal ligation  1996  . BREAST CYST EXCISION Left 2002  . FRACTURE SURGERY    . KNEE SURGERY     right  . LAPAROSCOPIC SUPRACERVICAL HYSTERECTOMY  08/05/2013  . ORIF WRIST FRACTURE Left 01/17/2017   Procedure: OPEN REDUCTION INTERNAL FIXATION (ORIF) WRIST FRACTURE;  Surgeon:  Lovell Sheehan, MD;  Location: [facility] OR[facility];  Service: Orthopedics;  Laterality: Left;  . RADIOLOGY WITH ANESTHESIA N/A 03/18/2020   Procedure: MRI WITH ANESTHESIA CERVICAL SPINE AND BRAIN  WITH AND WITHOUT CONTRAST;  Surgeon: Radiologist, Medication, MD;  Location: [place];  Service: Radiology;  Laterality: N/A;  . TUBAL LIGATION Bilateral   . VAGINAL HYSTERECTOMY  03/2006    There were no vitals filed for this visit.   Subjective Assessment - 08/05/20 1020    Subjective Patient reports right leg is numb and tingling that started yesterday. Feels weak as well. Will contact her doctor today.    Pertinent History Patient is previous patient of this therapist, was admitted to the hospital on 08/03/19 for MS exacerbation and then went to inpatient rehab on 08/08/19. Patient then received home health therapy and now is returning to outpatient PT. PMh includes HTN, SVT, neurogenic bowel, MS (1995), neuropathy, hx of wrist fx, HPV, Hypercholesteremia, adiposity, osteopenia, spasticity. She wears bilateral AFOs and uses a RW and/or wheelchair for mobility. She drives an adapted car.    Currently in Pain? Yes    Pain Score 4     Pain Location Leg    Pain Orientation Right    Pain Descriptors / Indicators Aching    Pain Type Chronic pain;Neuropathic pain    Pain Onset Yesterday    Pain Frequency  Constant            Patient reports right leg is numb and tingling that started yesterday. Feels weak as well. Will contact her doctor today.    standing with RW Ambulate 40 ft with RW; hyperextension of RLE and limited foot clearance of RLE; ambulation terminated   Seated:   silver swiss ball forward rollout 10x each direction: forward/backward lateral R/lateral L, lateral L lateral R for 30 total with 5 second holds Green dynadisc hamstring isometric 10x 3 second holds each LE ; x 2 sets  Green dynadisc df/pf 12x each LE  Balloon taps reaching inside/outside BOS x 3 minutes Weighted ball (2000 gr) chest  press 12x, straight arm raise 10x GTB around knee: abduction 10x, adduction 10x against PT resistance       Ice pack donned throughout session to minimize exacerbation of MS symptoms in order to perform interventions.      Pt educated throughout session about proper posture and technique with exercises. Improved exercise technique, movement at target joints, use of target muscles after min to mod verbal, visual, tactile cues                          PT Education - 08/05/20 1006    Education provided Yes    Education Details exercise technique, body mechanics    Person(s) Educated Patient    Methods Explanation;Demonstration;Tactile cues;Verbal cues    Comprehension Verbalized understanding;Returned demonstration;Verbal cues required;Tactile cues required            PT Short Term Goals - 07/27/20 1023      PT SHORT TERM GOAL #1   Title Patient will be independent in home exercise program to improve strength/mobility for better functional independence with ADLs.    Baseline 5/19: HEP next session 6/22: HEP compliant 8/10 : HEP progressions compliant 8/31 HEP compliant 10/5: HEP compliant    Time 4    Period Weeks    Status Achieved    Target Date 01/13/20      PT SHORT TERM GOAL #2   Title Patient will perform a sit to stand with SUE support for increased ind. with transfers.    Baseline 5/19: heavy BUE support 6/22: requires one hand on walker one hand on w/c 8/10: able to perform intermittently (some days can some days cannot) 8/31: attempt next session 10/5: BUE reliance, patient unable to complete 4 full stands. Attempt next session 1/25: able to perform from elevated surface only; 3/22 pt pushes through L arm off of wheelchair with R arm on RW for tactile cue/balance to perform transfer    Time 4    Period Weeks    Status Partially Met    Target Date 08/24/20             PT Long Term Goals - 07/30/20 0001      PT LONG TERM GOAL #1   Title  Patient will increase FOTO score to equal to or greater than  53/100   to demonstrate statistically significant improvement in mobility and quality of life.    Baseline 8/31: 48.13% 7/27:  48.1% 6/22: 46.99% 5/18: 48/100 10/5: 49/100 11/2: 48%  12/30: 46.9% 1/25: 46.99%; 07/27/2020 FOTO 42%    Time 12    Period Weeks    Status Partially Met    Target Date 08/24/20      PT LONG TERM GOAL #2   Title Patient (< 32 years old) will  complete five times sit to stand test in < 20 seconds indicating an increased LE strength and improved balance.    Baseline 7/27: 26.8 seconds with one partial stand 6/22: 40. 86 sec; unable to reach full stand last two attempts 5/19: 42 seconds 10/5: BUE reliance, patient unable to complete 4 full stands. Attempt next session 11/2: 23.71 seconds 12/30: 30.32 with one near LOB due to spasms 1/25: 27.11 seconds with one hand on RW one hand on chair; 37.5 seconds with use of BUEs    Time 12    Period Weeks    Status Partially Met    Target Date 08/24/20      PT LONG TERM GOAL #3   Title Patient will increase 10 meter walk test to <20 seconds as to improve gait speed for better community ambulation and to reduce fall risk.    Baseline 8/31: 35.1 seconds with RW 7/27: 36 seconds with RW 6/22: 36.1 seconds 5/19: 42 seconds 10/5: 31.9 seconds with RW 11/2: 29.9 seconds with RW 12/30: deferred due to spasm 1/25: defer to next session 1/27: 29 seconds with RW; 07/27/2020 deferred; 07/29/2020 41.5 sec (0.24 m/s)    Time 12    Period Weeks    Status Partially Met    Target Date 08/24/20      PT LONG TERM GOAL #4   Title Patient will reduce timed up and go to <11 seconds to reduce fall risk and demonstrate improved transfer/gait ability.    Baseline 8/31: 50.8 seconds with RW 7/27: 43.65 6/22: 45.4 seconds 5/19: 47 seconds 10/5: 38.5 seconds with RW 11/2: 35.72 seconds with RW 12/30: 43.5 seconds with wheels wet. 1/25: deferred 1/27: 38.9 seconds; 07/27/2020 82 seconds with RW, CGA;  07/29/2020 44 seconds    Time 12    Period Weeks    Status Partially Met    Target Date 08/24/20      PT LONG TERM GOAL #5   Title Patient will ambulate from physical therapy gym to elevator down the hall, ride elevator to first floor, and walk to valet with RW and chair follow for increased capacity for mobility and ind.    Baseline 8/31: able to open door ~50 ft with one rest break 8/10: unable to ambulate to elevator without rest break 9/30: able to ambulate to elevator with 3 rest breaks 1/25: 181 ft with 2 seated rest break (to elevator); 07/27/2020 deferred    Time 12    Period Weeks    Status Partially Met    Target Date 08/24/20      PT LONG TERM GOAL #6   Title Patient will be able to stand with RW and lift one leg onto 4" step for carryover to negotiation of steps at home.    Baseline 11/2: unable to perform 1/25: R foot 0.75 inch lift; 07/27/2020 heel not fully off ground, but pt slightly able to lift R LE to tap side of step while holding onto RW with both hands    Time 12    Period Weeks    Status Partially Met    Target Date 08/24/20                 Plan - 08/05/20 1102    Clinical Impression Statement Patient's session limited by  right numbness and pain this session. She remains highly motivated despite pain and limited activation of RLE. Ambulation was terminated due to poor foot clearance and high fall risk. The patient would benefit from  continued skilled PT intervention to maximize QOL, safety, and function    Personal Factors and Comorbidities Age;Comorbidity 3+;Fitness;Past/Current Experience;Sex;Time since onset of injury/illness/exacerbation    Comorbidities HTN, SVT, neurogenic bowel, MS (1995), neuropathy, hx of wrist fx, HPV, Hypercholesteremia, adiposity, osteopenia, spasticity.    Examination-Activity Limitations Bathing;Bed Mobility;Bend;Carry;Continence;Dressing;Hygiene/Grooming;Stairs;Squat;Reach Overhead;Locomotion Level;Lift;Stand;Transfers     Examination-Participation Restrictions Church;Cleaning;Community Activity;Laundry;Volunteer;Shop;Meal Prep;Yard Work    [employer] Evolving/Moderate complexity    Rehab Potential Good    PT Frequency 2x / week    PT Duration 12 weeks    PT Treatment/Interventions ADLs/Self Care Home Management;Aquatic Therapy;Biofeedback;Cryotherapy;Electrical Stimulation;Iontophoresis 12m/ml Dexamethasone;Traction;Moist Heat;Ultrasound;DME Instruction;Gait training;Stair training;Functional mobility training;Therapeutic activities;Therapeutic exercise;Balance training;Neuromuscular re-education;Patient/family education;Orthotic Fit/Training;Wheelchair mobility training;Manual techniques;Compression bandaging;Passive range of motion;Dry needling;Energy conservation;Taping;Splinting;Vestibular    PT Next Visit Plan step negotiation, turning directions to the L and R; re-test goals    PT Home Exercise Plan maintain [place] HEP [place] Agree with Plan of Care Patient           Patient will benefit from skilled therapeutic intervention in order to improve the following deficits and impairments:  Abnormal gait,Decreased activity tolerance,Decreased balance,Decreased coordination,Decreased endurance,Decreased mobility,Decreased range of motion,Decreased strength,Difficulty walking,Impaired perceived functional ability,Impaired sensation,Impaired tone,Impaired UE functional use,Improper body mechanics,Postural dysfunction,Cardiopulmonary status limiting activity,Impaired flexibility,Increased muscle spasms  Visit Diagnosis: Muscle weakness (generalized)  Other abnormalities of gait and mobility  Unsteadiness on feet     Problem List Patient Active Problem List   Diagnosis Date Noted  . Hypoalbuminemia due to protein-calorie malnutrition ([place]   . Neurogenic bowel   . Neurogenic bladder   . Labile blood pressure   . Neuropathic pain   . Abscess of female pelvis    . SVT (supraventricular tachycardia) ([place]   . Radial styloid tenosynovitis 03/12/2018  . Wheelchair confinement 02/27/2018  . Localized osteoporosis with current pathological fracture with routine healing 01/19/2017  . Wrist fracture 01/16/2017  . Sprain of ankle 03/23/2016  . Closed fracture of lateral malleolus 03/16/2016  . Health care maintenance 01/24/2016  . Blood pressure elevated without history of HTN 10/25/2015  . Essential hypertension 10/25/2015  . Multiple sclerosis ([place] 10/02/2015  . Chronic left shoulder pain 07/19/2015  . Multiple sclerosis exacerbation ([place] 07/14/2015  . MS (multiple sclerosis) ([place] 11/26/2014  . Increased body mass index 11/26/2014  . HPV test positive 11/26/2014  . Status post laparoscopic supracervical hysterectomy 11/26/2014  . Galactorrhea 11/26/2014  . Back ache 05/21/2014  . Adiposity 05/21/2014  . Disordered sleep 05/21/2014  . Muscle spasticity 05/21/2014  . Spasticity 05/21/2014  . Calculus of kidney 12/09/2013  . Renal colic 007/04/1974 . Hypercholesteremia 08/19/2013  . Hereditary and idiopathic neuropathy 08/19/2013  . Hypercholesterolemia without hypertriglyceridemia 08/19/2013  . Bladder infection, chronic 07/25/2012  . Disorder of bladder function 07/25/2012  . Incomplete bladder emptying 07/25/2012  . Microscopic hematuria 07/25/2012  . Right upper quadrant pain 07/25/2012   MJanna Arch PT, DPT    08/05/2020, 1:05 PM  [facility] [address] [address]Phone: [phone]  Fax:  [phone] Name: JFayne McguffeeRamseur MRN: [id number] of Birth: 6Dec 08, 1962

## 2020-08-07 ENCOUNTER — Other Ambulatory Visit: Payer: Self-pay | Admitting: Neurology

## 2020-08-09 DIAGNOSIS — Z5181 Encounter for therapeutic drug level monitoring: Secondary | ICD-10-CM | POA: Diagnosis not present

## 2020-08-10 ENCOUNTER — Other Ambulatory Visit: Payer: Self-pay

## 2020-08-10 ENCOUNTER — Ambulatory Visit: Payer: PPO | Attending: Neurology

## 2020-08-10 DIAGNOSIS — G35 Multiple sclerosis: Secondary | ICD-10-CM | POA: Diagnosis not present

## 2020-08-10 DIAGNOSIS — R2681 Unsteadiness on feet: Secondary | ICD-10-CM | POA: Insufficient documentation

## 2020-08-10 DIAGNOSIS — M6281 Muscle weakness (generalized): Secondary | ICD-10-CM | POA: Diagnosis not present

## 2020-08-10 DIAGNOSIS — R2689 Other abnormalities of gait and mobility: Secondary | ICD-10-CM | POA: Insufficient documentation

## 2020-08-10 LAB — CBC WITH DIFFERENTIAL/PLATELET
Basophils Absolute: 0 10*3/uL (ref 0.0–0.2)
Basos: 0 %
EOS (ABSOLUTE): 0.1 10*3/uL (ref 0.0–0.4)
Eos: 1 %
Hematocrit: 38.1 % (ref 34.0–46.6)
Hemoglobin: 12.7 g/dL (ref 11.1–15.9)
Immature Grans (Abs): 0 10*3/uL (ref 0.0–0.1)
Immature Granulocytes: 0 %
Lymphocytes Absolute: 0.3 10*3/uL — ABNORMAL LOW (ref 0.7–3.1)
Lymphs: 6 %
MCH: 25.9 pg — ABNORMAL LOW (ref 26.6–33.0)
MCHC: 33.3 g/dL (ref 31.5–35.7)
MCV: 78 fL — ABNORMAL LOW (ref 79–97)
Monocytes Absolute: 0.7 10*3/uL (ref 0.1–0.9)
Monocytes: 14 %
Neutrophils Absolute: 4.1 10*3/uL (ref 1.4–7.0)
Neutrophils: 79 %
Platelets: 235 10*3/uL (ref 150–450)
RBC: 4.91 x10E6/uL (ref 3.77–5.28)
RDW: 14.6 % (ref 11.7–15.4)
WBC: 5.2 10*3/uL (ref 3.4–10.8)

## 2020-08-10 LAB — URINALYSIS, ROUTINE W REFLEX MICROSCOPIC
Bilirubin, UA: NEGATIVE
Glucose, UA: NEGATIVE
Ketones, UA: NEGATIVE
Nitrite, UA: NEGATIVE
RBC, UA: NEGATIVE
Specific Gravity, UA: >=1.03 — AB (ref 1.005–1.030)
Urobilinogen, Ur: 1 mg/dL (ref 0.2–1.0)
pH, UA: 5.5 (ref 5.0–7.5)

## 2020-08-10 LAB — COMPREHENSIVE METABOLIC PANEL
ALT: 45 IU/L — ABNORMAL HIGH (ref 0–32)
AST: 24 IU/L (ref 0–40)
Albumin/Globulin Ratio: 1.6 (ref 1.2–2.2)
Albumin: 3.9 g/dL (ref 3.8–4.9)
Alkaline Phosphatase: 118 IU/L (ref 44–121)
BUN/Creatinine Ratio: 34 — ABNORMAL HIGH (ref 9–23)
BUN: 23 mg/dL (ref 6–24)
Bilirubin Total: 0.4 mg/dL (ref 0.0–1.2)
CO2: 22 mmol/L (ref 20–29)
Calcium: 8.8 mg/dL (ref 8.7–10.2)
Chloride: 106 mmol/L (ref 96–106)
Creatinine, Ser: 0.68 mg/dL (ref 0.57–1.00)
Globulin, Total: 2.4 g/dL (ref 1.5–4.5)
Glucose: 88 mg/dL (ref 65–99)
Potassium: 3.7 mmol/L (ref 3.5–5.2)
Sodium: 142 mmol/L (ref 134–144)
Total Protein: 6.3 g/dL (ref 6.0–8.5)
eGFR: 101 mL/min/{1.73_m2} (ref >59)

## 2020-08-10 LAB — MICROSCOPIC EXAMINATION
Casts: NONE SEEN /lpf
Epithelial Cells (non renal): >10 /hpf — AB (ref 0–10)

## 2020-08-10 NOTE — Therapy (Signed)
[place] MAIN [facility] SERVICES [address] [place], [place], [address] Phone: [phone]   Fax:  [phone]  Physical Therapy Treatment  Patient Details  Name: Lisa Williams MRN: [id number] Date of Birth: Aug 18, 1960 Referring Provider (PT): Gurney Maxin, MD   Encounter Date: 08/10/2020   PT End of Session - 08/10/20 1248    Visit Number [id number]    Number of Visits 91    Date for PT Re-Evaluation 08/24/20    PT Start Time 4166    PT Stop Time 1059    PT Time Calculation (min) 44 min    Equipment Utilized During Treatment Gait belt;Other (comment)    Activity Tolerance Patient tolerated treatment well;Patient limited by fatigue    Behavior During Therapy [facility] for tasks assessed/performed           Past Medical History:  Diagnosis Date  . Abdominal pain, right upper quadrant   . Back pain   . Calculus of kidney 12/09/2013  . Chronic back pain    unspecified  . Chronic left shoulder pain 07/19/2015  . Complication of anesthesia   . Functional disorder of bladder    other  . Galactorrhea 11/26/2014   Chronic   . Hereditary and idiopathic neuropathy 08/19/2013  . HPV test positive   . Hypercholesteremia 08/19/2013  . Hypertension   . Incomplete bladder emptying   . Microscopic hematuria   . MS (multiple sclerosis) ([place])   . Muscle spasticity 05/21/2014  . Nonspecific findings on examination of urine    other  . Osteopenia   . PONV (postoperative nausea and vomiting)   . Status post laparoscopic supracervical hysterectomy 11/26/2014  . Tobacco user 11/26/2014  . Wrist fracture     Past Surgical History:  Procedure Laterality Date  . bilateral tubal ligation  1996  . BREAST CYST EXCISION Left 2002  . FRACTURE SURGERY    . KNEE SURGERY     right  . LAPAROSCOPIC SUPRACERVICAL HYSTERECTOMY  08/05/2013  . ORIF WRIST FRACTURE Left 01/17/2017   Procedure: OPEN REDUCTION INTERNAL FIXATION (ORIF) WRIST FRACTURE;  Surgeon: Lovell Sheehan, MD;  Location: [facility] ORS;  Service: Orthopedics;  Laterality: Left;  . RADIOLOGY WITH ANESTHESIA N/A 03/18/2020   Procedure: MRI WITH ANESTHESIA CERVICAL SPINE AND BRAIN  WITH AND WITHOUT CONTRAST;  Surgeon: Radiologist, Medication, MD;  Location: [facility];  Service: Radiology;  Laterality: N/A;  . TUBAL LIGATION Bilateral   . VAGINAL HYSTERECTOMY  03/2006    There were no vitals filed for this visit.   Subjective Assessment - 08/10/20 1247    Subjective Patient was put on medication for potential MS exacerbation. She did blood work and urine sample yesterday, has not heard yet.    Pertinent History Patient is previous patient of this therapist, was admitted to the hospital on 08/03/19 for MS exacerbation and then went to inpatient rehab on 08/08/19. Patient then received home health therapy and now is returning to outpatient PT. PMh includes HTN, SVT, neurogenic bowel, MS (1995), neuropathy, hx of wrist fx, HPV, Hypercholesteremia, adiposity, osteopenia, spasticity. She wears bilateral AFOs and uses a RW and/or wheelchair for mobility. She drives an adapted car.    Currently in Pain? No/denies    Pain Onset Yesterday             Patient was put on medication for potential MS exacerbation. She did blood work and urine sample yesterday, has not heard yet.    Gait: Patient ambulates~662f,1seated  rest break CGA and wheelchair follow for safety. Patient able to navigate opening doors, turns, and thresholds independently but with inc time to perform. Patient fatigued by end of ambulation.      Seated:  RTB ER 15x  RTB abduction 15x  RTB overhead raises with back unsupported 15x   RTB around knee: abduction 15x,  Green ball adduction squeeze 15x     Ice pack donned throughout session to minimize exacerbation of MS symptoms in order to perform interventions.      Pt educated throughout session about proper posture and technique with exercises. Improved exercise technique,  movement at target joints, use of target muscles after min to mod verbal, visual, tactile cues                     PT Education - 08/10/20 1247    Education provided Yes    Education Details ambulation, need for rest breaks    Person(s) Educated Patient    Methods Explanation;Demonstration;Tactile cues;Verbal cues    Comprehension Verbalized understanding;Returned demonstration;Verbal cues required;Tactile cues required            PT Short Term Goals - 07/27/20 1023      PT SHORT TERM GOAL #1   Title Patient will be independent in home exercise program to improve strength/mobility for better functional independence with ADLs.    Baseline 5/19: HEP next session 6/22: HEP compliant 8/10 : HEP progressions compliant 8/31 HEP compliant 10/5: HEP compliant    Time 4    Period Weeks    Status Achieved    Target Date 01/13/20      PT SHORT TERM GOAL #2   Title Patient will perform a sit to stand with SUE support for increased ind. with transfers.    Baseline 5/19: heavy BUE support 6/22: requires one hand on walker one hand on w/c 8/10: able to perform intermittently (some days can some days cannot) 8/31: attempt next session 10/5: BUE reliance, patient unable to complete 4 full stands. Attempt next session 1/25: able to perform from elevated surface only; 3/22 pt pushes through L arm off of wheelchair with R arm on RW for tactile cue/balance to perform transfer    Time 4    Period Weeks    Status Partially Met    Target Date 08/24/20             PT Long Term Goals - 07/30/20 0001      PT LONG TERM GOAL #1   Title Patient will increase FOTO score to equal to or greater than  53/100   to demonstrate statistically significant improvement in mobility and quality of life.    Baseline 8/31: 48.13% 7/27:  48.1% 6/22: 46.99% 5/18: 48/100 10/5: 49/100 11/2: 48%  12/30: 46.9% 1/25: 46.99%; 07/27/2020 FOTO 42%    Time 12    Period Weeks    Status Partially Met    Target  Date 08/24/20      PT LONG TERM GOAL #2   Title Patient (< 66 years old) will complete five times sit to stand test in < 20 seconds indicating an increased LE strength and improved balance.    Baseline 7/27: 26.8 seconds with one partial stand 6/22: 40. 86 sec; unable to reach full stand last two attempts 5/19: 42 seconds 10/5: BUE reliance, patient unable to complete 4 full stands. Attempt next session 11/2: 23.71 seconds 12/30: 30.32 with one near LOB due to spasms 1/25: 27.11 seconds with  one hand on RW one hand on chair; 37.5 seconds with use of BUEs    Time 12    Period Weeks    Status Partially Met    Target Date 08/24/20      PT LONG TERM GOAL #3   Title Patient will increase 10 meter walk test to <20 seconds as to improve gait speed for better community ambulation and to reduce fall risk.    Baseline 8/31: 35.1 seconds with RW 7/27: 36 seconds with RW 6/22: 36.1 seconds 5/19: 42 seconds 10/5: 31.9 seconds with RW 11/2: 29.9 seconds with RW 12/30: deferred due to spasm 1/25: defer to next session 1/27: 29 seconds with RW; 07/27/2020 deferred; 07/29/2020 41.5 sec (0.24 m/s)    Time 12    Period Weeks    Status Partially Met    Target Date 08/24/20      PT LONG TERM GOAL #4   Title Patient will reduce timed up and go to <11 seconds to reduce fall risk and demonstrate improved transfer/gait ability.    Baseline 8/31: 50.8 seconds with RW 7/27: 43.65 6/22: 45.4 seconds 5/19: 47 seconds 10/5: 38.5 seconds with RW 11/2: 35.72 seconds with RW 12/30: 43.5 seconds with wheels wet. 1/25: deferred 1/27: 38.9 seconds; 07/27/2020 82 seconds with RW, CGA; 07/29/2020 44 seconds    Time 12    Period Weeks    Status Partially Met    Target Date 08/24/20      PT LONG TERM GOAL #5   Title Patient will ambulate from physical therapy gym to elevator down the hall, ride elevator to first floor, and walk to valet with RW and chair follow for increased capacity for mobility and ind.    Baseline 8/31: able to  open door ~50 ft with one rest break 8/10: unable to ambulate to elevator without rest break 9/30: able to ambulate to elevator with 3 rest breaks 1/25: 181 ft with 2 seated rest break (to elevator); 07/27/2020 deferred    Time 12    Period Weeks    Status Partially Met    Target Date 08/24/20      PT LONG TERM GOAL #6   Title Patient will be able to stand with RW and lift one leg onto 4" step for carryover to negotiation of steps at home.    Baseline 11/2: unable to perform 1/25: R foot 0.75 inch lift; 07/27/2020 heel not fully off ground, but pt slightly able to lift R LE to tap side of step while holding onto RW with both hands    Time 12    Period Weeks    Status Partially Met    Target Date 08/24/20                 Plan - 08/10/20 1248    Clinical Impression Statement Patient was able to tolerate ambulation this session despite recent onset of MS exacerbation symptoms. She requires additional time for task performance as well as increased rest breaks this session but remains highly motivated throughout session. The patient would benefit from continued skilled PT intervention to maximize QOL, safety, and function    Personal Factors and Comorbidities Age;Comorbidity 3+;Fitness;Past/Current Experience;Sex;Time since onset of injury/illness/exacerbation    Comorbidities HTN, SVT, neurogenic bowel, MS (1995), neuropathy, hx of wrist fx, HPV, Hypercholesteremia, adiposity, osteopenia, spasticity.    Examination-Activity Limitations Bathing;Bed Mobility;Bend;Carry;Continence;Dressing;Hygiene/Grooming;Stairs;Squat;Reach Overhead;Locomotion Level;Lift;Stand;Transfers    Examination-Participation Restrictions Church;Cleaning;Community Activity;Laundry;Volunteer;Shop;Meal Prep;Yard Work    [employer] Evolving/Moderate complexity  Rehab Potential Good    PT Frequency 2x / week    PT Duration 12 weeks    PT Treatment/Interventions ADLs/Self Care Home  Management;Aquatic Therapy;Biofeedback;Cryotherapy;Electrical Stimulation;Iontophoresis 38m/ml Dexamethasone;Traction;Moist Heat;Ultrasound;DME Instruction;Gait training;Stair training;Functional mobility training;Therapeutic activities;Therapeutic exercise;Balance training;Neuromuscular re-education;Patient/family education;Orthotic Fit/Training;Wheelchair mobility training;Manual techniques;Compression bandaging;Passive range of motion;Dry needling;Energy conservation;Taping;Splinting;Vestibular    PT Next Visit Plan step negotiation, turning directions to the L and R; re-test goals    PT Home Exercise Plan [place] with Plan of Care Patient           Patient will benefit from skilled therapeutic intervention in order to improve the following deficits and impairments:  Abnormal gait,Decreased activity tolerance,Decreased balance,Decreased coordination,Decreased endurance,Decreased mobility,Decreased range of motion,Decreased strength,Difficulty walking,Impaired perceived functional ability,Impaired sensation,Impaired tone,Impaired UE functional use,Improper body mechanics,Postural dysfunction,Cardiopulmonary status limiting activity,Impaired flexibility,Increased muscle spasms  Visit Diagnosis: Muscle weakness (generalized)  Other abnormalities of gait and mobility  Unsteadiness on feet     Problem List Patient Active Problem List   Diagnosis Date Noted  . Hypoalbuminemia due to protein-calorie malnutrition ([place]   . Neurogenic bowel   . Neurogenic bladder   . Labile blood pressure   . Neuropathic pain   . Abscess of female pelvis   . SVT (supraventricular tachycardia) ([place]   . Radial styloid tenosynovitis 03/12/2018  . Wheelchair confinement 02/27/2018  . Localized osteoporosis with current pathological fracture with routine healing 01/19/2017  . Wrist fracture 01/16/2017  . Sprain of ankle 03/23/2016  . Closed fracture of lateral  malleolus 03/16/2016  . Health care maintenance 01/24/2016  . Blood pressure elevated without history of HTN 10/25/2015  . Essential hypertension 10/25/2015  . Multiple sclerosis ([place] 10/02/2015  . Chronic left shoulder pain 07/19/2015  . Multiple [place] exacerbation ([place] 07/14/2015  . MS (multiple sclerosis) ([place] 11/26/2014  . Increased body mass index 11/26/2014  . HPV test positive 11/26/2014  . Status post laparoscopic supracervical hysterectomy 11/26/2014  . Galactorrhea 11/26/2014  . Back ache 05/21/2014  . Adiposity 05/21/2014  . Disordered sleep 05/21/2014  . Muscle spasticity 05/21/2014  . Spasticity 05/21/2014  . Calculus of kidney 12/09/2013  . Renal colic 018/84/1660 . Hypercholesteremia 08/19/2013  . Hereditary and idiopathic neuropathy 08/19/2013  . Hypercholesterolemia without hypertriglyceridemia 08/19/2013  . Bladder infection, chronic 07/25/2012  . Disorder of bladder function 07/25/2012  . Incomplete bladder emptying 07/25/2012  . Microscopic hematuria 07/25/2012  . Right upper quadrant pain 07/25/2012   MJanna Williams PT, DPT   08/10/2020, 12:49 PM  [facility] [address]RF[place] [address]Phone: [phone]  Fax:  [phone] Name: JElysse PolidoreRamseur MRN: [id number] of Birth: [birth year]

## 2020-08-11 ENCOUNTER — Telehealth: Payer: Self-pay | Admitting: Neurology

## 2020-08-11 MED ORDER — SULFAMETHOXAZOLE-TRIMETHOPRIM 800-160 MG PO TABS: 1.0000 | ORAL_TABLET | Freq: Two times a day (BID) | ORAL | 0 refills | Status: DC

## 2020-08-11 NOTE — Telephone Encounter (Signed)
I called the patient.  The urine study showed evidence of a bladder infection, I will treat with Bactrim.  The patient had relatively unremarkable blood work with exception that the ALT was slightly elevated.  I will send in a 6-day course of Bactrim.  The patient claims that she is doing well with her symptoms of numbness, she is in physical therapy and doing well with this.

## 2020-08-12 ENCOUNTER — Other Ambulatory Visit: Payer: Self-pay

## 2020-08-12 ENCOUNTER — Ambulatory Visit: Payer: PPO

## 2020-08-12 DIAGNOSIS — I10 Essential (primary) hypertension: Secondary | ICD-10-CM | POA: Diagnosis not present

## 2020-08-12 DIAGNOSIS — R2681 Unsteadiness on feet: Secondary | ICD-10-CM

## 2020-08-12 DIAGNOSIS — R2689 Other abnormalities of gait and mobility: Secondary | ICD-10-CM

## 2020-08-12 DIAGNOSIS — G35 Multiple sclerosis: Secondary | ICD-10-CM | POA: Diagnosis not present

## 2020-08-12 DIAGNOSIS — M6281 Muscle weakness (generalized): Secondary | ICD-10-CM | POA: Diagnosis not present

## 2020-08-12 DIAGNOSIS — E669 Obesity, unspecified: Secondary | ICD-10-CM | POA: Diagnosis not present

## 2020-08-12 NOTE — Therapy (Signed)
[place] MAIN [facility] SERVICES [address] [place], [place], [address] Phone: [phone]   Fax:  [phone]  Physical Therapy Treatment  Patient Details  Name: Lisa Williams MRN: [id number] Date of Birth: 1961-04-12 Referring Provider (PT): Gurney Maxin, MD   Encounter Date: 08/12/2020   PT End of Session - 08/12/20 1351    Visit Number 18    Number of Visits 91    Date for PT Re-Evaluation 08/24/20    PT Start Time 7353    PT Stop Time 1057    PT Time Calculation (min) 42 min    Equipment Utilized During Treatment Gait belt;Other (comment)    Activity Tolerance Patient tolerated treatment well;Patient limited by fatigue    Behavior During Therapy [facility] for tasks assessed/performed           Past Medical History:  Diagnosis Date  . Abdominal pain, right upper quadrant   . Back pain   . Calculus of kidney 12/09/2013  . Chronic back pain    unspecified  . Chronic left shoulder pain 07/19/2015  . Complication of anesthesia   . Functional disorder of bladder    other  . Galactorrhea 11/26/2014   Chronic   . Hereditary and idiopathic neuropathy 08/19/2013  . HPV test positive   . Hypercholesteremia 08/19/2013  . Hypertension   . Incomplete bladder emptying   . Microscopic hematuria   . MS (multiple sclerosis) ([place])   . Muscle spasticity 05/21/2014  . Nonspecific findings on examination of urine    other  . Osteopenia   . PONV (postoperative nausea and vomiting)   . Status post laparoscopic supracervical hysterectomy 11/26/2014  . Tobacco user 11/26/2014  . Wrist fracture     Past Surgical History:  Procedure Laterality Date  . bilateral tubal ligation  1996  . BREAST CYST EXCISION Left 2002  . FRACTURE SURGERY    . KNEE SURGERY     right  . LAPAROSCOPIC SUPRACERVICAL HYSTERECTOMY  08/05/2013  . ORIF WRIST FRACTURE Left 01/17/2017   Procedure: OPEN REDUCTION INTERNAL FIXATION (ORIF) WRIST FRACTURE;  Surgeon: Lovell Sheehan, MD;  Location: [facility];  Service: Orthopedics;  Laterality: Left;  . RADIOLOGY WITH ANESTHESIA N/A 03/18/2020   Procedure: MRI WITH ANESTHESIA CERVICAL SPINE AND BRAIN  WITH AND WITHOUT CONTRAST;  Surgeon: Radiologist, Medication, MD;  Location: [place];  Service: Radiology;  Laterality: N/A;  . TUBAL LIGATION Bilateral   . VAGINAL HYSTERECTOMY  03/2006    There were no vitals filed for this visit.   Subjective Assessment - 08/12/20 1350    Subjective Patient was diagnosed with bladder infection. Is on medication for it now and notes having constipation as well. No falls or LOB since last session but does have weakness in legs.    Pertinent History Patient is previous patient of this therapist, was admitted to the hospital on 08/03/19 for MS exacerbation and then went to inpatient rehab on 08/08/19. Patient then received home health therapy and now is returning to outpatient PT. PMh includes HTN, SVT, neurogenic bowel, MS (1995), neuropathy, hx of wrist fx, HPV, Hypercholesteremia, adiposity, osteopenia, spasticity. She wears bilateral AFOs and uses a RW and/or wheelchair for mobility. She drives an adapted car.    Currently in Pain? No/denies                   in // bars:  -standing weight shift 10x    -Standing weight shifts with hip/knee flexion  x10 each side; UE support focus on hip shift rather than pull with arm; terminated after 8x each LE ;more challenged with LE movement this session.   Seated:    Green swiss ball overhead raises with back unsupported 15x ; 2 sets Hamstring isometric into PT hand 10x 3 second holds  GTB around knee: abduction 10x, adduction 10x against PT resistance   french pectoral stretch and movement 10x  Seated boxing: cues for sequencing, body mechanics, and pace/rhythm for functional contraction and timing of muscle recruitment: -Cross body punches to mitts outside BOS on PT hands with no back support for focused core stabilization with  crossing midline  60 seconds  -Cross body punch with secondary combination of elbow for full cross body rotation to PT mitt for trunk stability, coordination, and cardiovascular challenge x 60 seconds -Upper cut to mitts in PT hands for core activation without back support with perturbations 60 seconds     Ice pack donned throughout session to minimize exacerbation of MS symptoms in order to perform interventions.      Pt educated throughout session about proper posture and technique with exercises. Improved exercise technique, movement at target joints, use of target muscles after min to mod verbal, visual, tactile cues    Patient's session limited by new onset of bladder infection resulting in decreased LE strength and stability. She is unsafe in standing and requires seated position for rest of session. She remains highly motivated throughout the session despite the fatigue. The patient would benefit from continued skilled PT intervention to maximize QOL, safety, and function                  PT Education - 08/12/20 1351    Education provided Yes    Education Details exercise technique, body mechanics    Person(s) Educated Patient    Methods Explanation;Demonstration;Tactile cues;Verbal cues    Comprehension Verbalized understanding;Returned demonstration;Verbal cues required;Tactile cues required            PT Short Term Goals - 07/27/20 1023      PT SHORT TERM GOAL #1   Title Patient will be independent in home exercise program to improve strength/mobility for better functional independence with ADLs.    Baseline 5/19: HEP next session 6/22: HEP compliant 8/10 : HEP progressions compliant 8/31 HEP compliant 10/5: HEP compliant    Time 4    Period Weeks    Status Achieved    Target Date 01/13/20      PT SHORT TERM GOAL #2   Title Patient will perform a sit to stand with SUE support for increased ind. with transfers.    Baseline 5/19: heavy BUE support 6/22:  requires one hand on walker one hand on w/c 8/10: able to perform intermittently (some days can some days cannot) 8/31: attempt next session 10/5: BUE reliance, patient unable to complete 4 full stands. Attempt next session 1/25: able to perform from elevated surface only; 3/22 pt pushes through L arm off of wheelchair with R arm on RW for tactile cue/balance to perform transfer    Time 4    Period Weeks    Status Partially Met    Target Date 08/24/20             PT Long Term Goals - 07/30/20 0001      PT LONG TERM GOAL #1   Title Patient will increase FOTO score to equal to or greater than  53/100   to demonstrate statistically significant improvement  in mobility and quality of life.    Baseline 8/31: 48.13% 7/27:  48.1% 6/22: 46.99% 5/18: 48/100 10/5: 49/100 11/2: 48%  12/30: 46.9% 1/25: 46.99%; 07/27/2020 FOTO 42%    Time 12    Period Weeks    Status Partially Met    Target Date 08/24/20      PT LONG TERM GOAL #2   Title Patient (< 2 years old) will complete five times sit to stand test in < 20 seconds indicating an increased LE strength and improved balance.    Baseline 7/27: 26.8 seconds with one partial stand 6/22: 40. 86 sec; unable to reach full stand last two attempts 5/19: 42 seconds 10/5: BUE reliance, patient unable to complete 4 full stands. Attempt next session 11/2: 23.71 seconds 12/30: 30.32 with one near LOB due to spasms 1/25: 27.11 seconds with one hand on RW one hand on chair; 37.5 seconds with use of BUEs    Time 12    Period Weeks    Status Partially Met    Target Date 08/24/20      PT LONG TERM GOAL #3   Title Patient will increase 10 meter walk test to <20 seconds as to improve gait speed for better community ambulation and to reduce fall risk.    Baseline 8/31: 35.1 seconds with RW 7/27: 36 seconds with RW 6/22: 36.1 seconds 5/19: 42 seconds 10/5: 31.9 seconds with RW 11/2: 29.9 seconds with RW 12/30: deferred due to spasm 1/25: defer to next session 1/27: 29  seconds with RW; 07/27/2020 deferred; 07/29/2020 41.5 sec (0.24 m/s)    Time 12    Period Weeks    Status Partially Met    Target Date 08/24/20      PT LONG TERM GOAL #4   Title Patient will reduce timed up and go to <11 seconds to reduce fall risk and demonstrate improved transfer/gait ability.    Baseline 8/31: 50.8 seconds with RW 7/27: 43.65 6/22: 45.4 seconds 5/19: 47 seconds 10/5: 38.5 seconds with RW 11/2: 35.72 seconds with RW 12/30: 43.5 seconds with wheels wet. 1/25: deferred 1/27: 38.9 seconds; 07/27/2020 82 seconds with RW, CGA; 07/29/2020 44 seconds    Time 12    Period Weeks    Status Partially Met    Target Date 08/24/20      PT LONG TERM GOAL #5   Title Patient will ambulate from physical therapy gym to elevator down the hall, ride elevator to first floor, and walk to valet with RW and chair follow for increased capacity for mobility and ind.    Baseline 8/31: able to open door ~50 ft with one rest break 8/10: unable to ambulate to elevator without rest break 9/30: able to ambulate to elevator with 3 rest breaks 1/25: 181 ft with 2 seated rest break (to elevator); 07/27/2020 deferred    Time 12    Period Weeks    Status Partially Met    Target Date 08/24/20      PT LONG TERM GOAL #6   Title Patient will be able to stand with RW and lift one leg onto 4" step for carryover to negotiation of steps at home.    Baseline 11/2: unable to perform 1/25: R foot 0.75 inch lift; 07/27/2020 heel not fully off ground, but pt slightly able to lift R LE to tap side of step while holding onto RW with both hands    Time 12    Period Weeks    Status Partially  Met    Target Date 08/24/20                 Plan - 08/12/20 1352    Clinical Impression Statement Patient's session limited by new onset of bladder infection resulting in decreased LE strength and stability. She is unsafe in standing and requires seated position for rest of session. She remains highly motivated throughout the  session despite the fatigue. The patient would benefit from continued skilled PT intervention to maximize QOL, safety, and function    Personal Factors and Comorbidities Age;Comorbidity 3+;Fitness;Past/Current Experience;Sex;Time since onset of injury/illness/exacerbation    Comorbidities HTN, SVT, neurogenic bowel, MS (1995), neuropathy, hx of wrist fx, HPV, Hypercholesteremia, adiposity, osteopenia, spasticity.    Examination-Activity Limitations Bathing;Bed Mobility;Bend;Carry;Continence;Dressing;Hygiene/Grooming;Stairs;Squat;Reach Overhead;Locomotion Level;Lift;Stand;Transfers    Examination-Participation Restrictions Church;Cleaning;Community Activity;Laundry;Volunteer;Shop;Meal Prep;Yard Work    [employer] Evolving/Moderate complexity    Rehab Potential Good    PT Frequency 2x / week    PT Duration 12 weeks    PT Treatment/Interventions ADLs/Self Care Home Management;Aquatic Therapy;Biofeedback;Cryotherapy;Electrical Stimulation;Iontophoresis 58m/ml Dexamethasone;Traction;Moist Heat;Ultrasound;DME Instruction;Gait training;Stair training;Functional mobility training;Therapeutic activities;Therapeutic exercise;Balance training;Neuromuscular re-education;Patient/family education;Orthotic Fit/Training;Wheelchair mobility training;Manual techniques;Compression bandaging;Passive range of motion;Dry needling;Energy conservation;Taping;Splinting;Vestibular    PT Next Visit Plan step negotiation, turning directions to the L and R; re-test goals    PT Home Exercise Plan maintain current HEP from [facility] Agree with Plan of Care Patient           Patient will benefit from skilled therapeutic intervention in order to improve the following deficits and impairments:  Abnormal gait,Decreased activity tolerance,Decreased balance,Decreased coordination,Decreased endurance,Decreased mobility,Decreased range of motion,Decreased strength,Difficulty walking,Impaired perceived  functional ability,Impaired sensation,Impaired tone,Impaired UE functional use,Improper body mechanics,Postural dysfunction,Cardiopulmonary status limiting activity,Impaired flexibility,Increased muscle spasms  Visit Diagnosis: Muscle weakness (generalized)  Other abnormalities of gait and mobility  Unsteadiness on feet     Problem List Patient Active Problem List   Diagnosis Date Noted  . Hypoalbuminemia due to protein-calorie malnutrition ([place]   . Neurogenic bowel   . Neurogenic bladder   . Labile blood pressure   . Neuropathic pain   . Abscess of female pelvis   . SVT (supraventricular tachycardia) ([place]   . Radial styloid tenosynovitis 03/12/2018  . Wheelchair confinement 02/27/2018  . Localized osteoporosis with current pathological fracture with routine healing 01/19/2017  . Wrist fracture 01/16/2017  . Sprain of ankle 03/23/2016  . Closed fracture of lateral malleolus 03/16/2016  . Health care maintenance 01/24/2016  . Blood pressure elevated without history of HTN 10/25/2015  . Essential hypertension 10/25/2015  . Multiple sclerosis ([place] 10/02/2015  . Chronic left shoulder pain 07/19/2015  . Multiple sclerosis exacerbation ([place] 07/14/2015  . MS (multiple sclerosis) ([place] 11/26/2014  . Increased body mass index 11/26/2014  . HPV test positive 11/26/2014  . Status post laparoscopic supracervical hysterectomy 11/26/2014  . Galactorrhea 11/26/2014  . Back ache 05/21/2014  . Adiposity 05/21/2014  . Disordered sleep 05/21/2014  . Muscle spasticity 05/21/2014  . Spasticity 05/21/2014  . Calculus of kidney 12/09/2013  . Renal colic 049/44/9675 . Hypercholesteremia 08/19/2013  . Hereditary and idiopathic neuropathy 08/19/2013  . Hypercholesterolemia without hypertriglyceridemia 08/19/2013  . Bladder infection, chronic 07/25/2012  . Disorder of bladder function 07/25/2012  . Incomplete bladder emptying 07/25/2012  . Microscopic hematuria 07/25/2012  . Right upper  quadrant pain 07/25/2012   MJanna Arch PT, DPT   08/12/2020, 1:53 PM  [facility] [address]  [place], [place], [address] Phone: [phone]   Fax:  [phone]  Name: Lisa Williams MRN: [id number] Date of Birth: 1960/07/24

## 2020-08-17 ENCOUNTER — Ambulatory Visit: Payer: PPO

## 2020-08-17 ENCOUNTER — Other Ambulatory Visit: Payer: Self-pay

## 2020-08-17 DIAGNOSIS — R2681 Unsteadiness on feet: Secondary | ICD-10-CM

## 2020-08-17 DIAGNOSIS — R2689 Other abnormalities of gait and mobility: Secondary | ICD-10-CM

## 2020-08-17 DIAGNOSIS — M6281 Muscle weakness (generalized): Secondary | ICD-10-CM

## 2020-08-17 NOTE — Therapy (Signed)
[place] [facility] SERVICES [address] [place], [place], [address] Phone: [phone]   Fax:  [phone]  Physical Therapy Treatment  Patient Details  Name: Lisa Williams MRN: [id number] Date of Birth: 07-19-1960 Referring Provider (PT): Gurney Maxin, MD   Encounter Date: 08/17/2020   PT End of Session - 08/17/20 1233    Visit Number 13    Number of Visits 91    Date for PT Re-Evaluation 08/24/20    PT Start Time 1016    PT Stop Time 1059    PT Time Calculation (min) 43 min    Equipment Utilized During Treatment Gait belt;Other (comment)    Activity Tolerance Patient tolerated treatment well;Patient limited by fatigue    Behavior During Therapy [facility] for tasks assessed/performed           Past Medical History:  Diagnosis Date  . Abdominal pain, right upper quadrant   . Back pain   . Calculus of kidney 12/09/2013  . Chronic back pain    unspecified  . Chronic left shoulder pain 07/19/2015  . Complication of anesthesia   . Functional disorder of bladder    other  . Galactorrhea 11/26/2014   Chronic   . Hereditary and idiopathic neuropathy 08/19/2013  . HPV test positive   . Hypercholesteremia 08/19/2013  . Hypertension   . Incomplete bladder emptying   . Microscopic hematuria   . MS (multiple sclerosis) ([place])   . Muscle spasticity 05/21/2014  . Nonspecific findings on examination of urine    other  . Osteopenia   . PONV (postoperative nausea and vomiting)   . Status post laparoscopic supracervical hysterectomy 11/26/2014  . Tobacco user 11/26/2014  . Wrist fracture     Past Surgical History:  Procedure Laterality Date  . bilateral tubal ligation  1996  . BREAST CYST EXCISION Left 2002  . FRACTURE SURGERY    . KNEE SURGERY     right  . LAPAROSCOPIC SUPRACERVICAL HYSTERECTOMY  08/05/2013  . ORIF WRIST FRACTURE Left 01/17/2017   Procedure: OPEN REDUCTION INTERNAL FIXATION (ORIF) WRIST FRACTURE;  Surgeon:  Lovell Sheehan, MD;  Location: [facility];  Service: Orthopedics;  Laterality: Left;  . RADIOLOGY WITH ANESTHESIA N/A 03/18/2020   Procedure: MRI WITH ANESTHESIA CERVICAL SPINE AND BRAIN  WITH AND WITHOUT CONTRAST;  Surgeon: Radiologist, Medication, MD;  Location: [place];  Service: Radiology;  Laterality: N/A;  . TUBAL LIGATION Bilateral   . VAGINAL HYSTERECTOMY  03/2006    There were no vitals filed for this visit.   Subjective Assessment - 08/17/20 1020    Subjective Patient had an episode of dizziness and nausea yesterday after ingesting fried food. Still having bladder issues.    Pertinent History Patient is previous patient of this therapist, was admitted to the hospital on 08/03/19 for MS exacerbation and then went to inpatient rehab on 08/08/19. Patient then received home health therapy and now is returning to outpatient PT. PMh includes HTN, SVT, neurogenic bowel, MS (1995), neuropathy, hx of wrist fx, HPV, Hypercholesteremia, adiposity, osteopenia, spasticity. She wears bilateral AFOs and uses a RW and/or wheelchair for mobility. She drives an adapted car.    Currently in Pain? No/denies                    in // bars:  -standing weight shift 10x    -Standing weight shifts with hip/knee flexion x10 each side; UE support focus on hip shift rather than pull with  arm; terminated after 8x each LE ;more challenged with LE movement this session.  -18" step cone pick up 4 cones and reach across midline to pass to PT    Seated:   df/pf into green dynadisc 10x 3 second holds each LE  Hamstring isometric into dynadisc 10x 3 second holds  GTB around knee: abduction 10x, adduction 10x against PT resistance  Green ball adduction squeeze 10x 3 second holds  Lateral trunk raises 10x each side  AAROM LAQ 10x each LE with tactile cueing.  GTB row 10x GTB ER 10x with assistance with wrapping hand.      Ice pack donned throughout session to minimize exacerbation of MS symptoms in order to  perform interventions.      Pt educated throughout session about proper posture and technique with exercises. Improved exercise technique, movement at target joints, use of target muscles after min to mod verbal, visual, tactile cues                       PT Education - 08/17/20 1226    Education provided Yes    Education Details exercise technique, body mechanics    Person(s) Educated Patient    Methods Explanation;Demonstration;Tactile cues;Verbal cues    Comprehension Verbalized understanding;Returned demonstration;Verbal cues required;Tactile cues required            PT Short Term Goals - 07/27/20 1023      PT SHORT TERM GOAL #1   Title Patient will be independent in home exercise program to improve strength/mobility for better functional independence with ADLs.    Baseline 5/19: HEP next session 6/22: HEP compliant 8/10 : HEP progressions compliant 8/31 HEP compliant 10/5: HEP compliant    Time 4    Period Weeks    Status Achieved    Target Date 01/13/20      PT SHORT TERM GOAL #2   Title Patient will perform a sit to stand with SUE support for increased ind. with transfers.    Baseline 5/19: heavy BUE support 6/22: requires one hand on walker one hand on w/c 8/10: able to perform intermittently (some days can some days cannot) 8/31: attempt next session 10/5: BUE reliance, patient unable to complete 4 full stands. Attempt next session 1/25: able to perform from elevated surface only; 3/22 pt pushes through L arm off of wheelchair with R arm on RW for tactile cue/balance to perform transfer    Time 4    Period Weeks    Status Partially Met    Target Date 08/24/20             PT Long Term Goals - 07/30/20 0001      PT LONG TERM GOAL #1   Title Patient will increase FOTO score to equal to or greater than  53/100   to demonstrate statistically significant improvement in mobility and quality of life.    Baseline 8/31: 48.13% 7/27:  48.1% 6/22: 46.99%  5/18: 48/100 10/5: 49/100 11/2: 48%  12/30: 46.9% 1/25: 46.99%; 07/27/2020 FOTO 42%    Time 12    Period Weeks    Status Partially Met    Target Date 08/24/20      PT LONG TERM GOAL #2   Title Patient (< 77 years old) will complete five times sit to stand test in < 20 seconds indicating an increased LE strength and improved balance.    Baseline 7/27: 26.8 seconds with one partial stand 6/22: 40. 86 sec; unable  to reach full stand last two attempts 5/19: 42 seconds 10/5: BUE reliance, patient unable to complete 4 full stands. Attempt next session 11/2: 23.71 seconds 12/30: 30.32 with one near LOB due to spasms 1/25: 27.11 seconds with one hand on RW one hand on chair; 37.5 seconds with use of BUEs    Time 12    Period Weeks    Status Partially Met    Target Date 08/24/20      PT LONG TERM GOAL #3   Title Patient will increase 10 meter walk test to <20 seconds as to improve gait speed for better community ambulation and to reduce fall risk.    Baseline 8/31: 35.1 seconds with RW 7/27: 36 seconds with RW 6/22: 36.1 seconds 5/19: 42 seconds 10/5: 31.9 seconds with RW 11/2: 29.9 seconds with RW 12/30: deferred due to spasm 1/25: defer to next session 1/27: 29 seconds with RW; 07/27/2020 deferred; 07/29/2020 41.5 sec (0.24 m/s)    Time 12    Period Weeks    Status Partially Met    Target Date 08/24/20      PT LONG TERM GOAL #4   Title Patient will reduce timed up and go to <11 seconds to reduce fall risk and demonstrate improved transfer/gait ability.    Baseline 8/31: 50.8 seconds with RW 7/27: 43.65 6/22: 45.4 seconds 5/19: 47 seconds 10/5: 38.5 seconds with RW 11/2: 35.72 seconds with RW 12/30: 43.5 seconds with wheels wet. 1/25: deferred 1/27: 38.9 seconds; 07/27/2020 82 seconds with RW, CGA; 07/29/2020 44 seconds    Time 12    Period Weeks    Status Partially Met    Target Date 08/24/20      PT LONG TERM GOAL #5   Title Patient will ambulate from physical therapy gym to elevator down the  hall, ride elevator to first floor, and walk to valet with RW and chair follow for increased capacity for mobility and ind.    Baseline 8/31: able to open door ~50 ft with one rest break 8/10: unable to ambulate to elevator without rest break 9/30: able to ambulate to elevator with 3 rest breaks 1/25: 181 ft with 2 seated rest break (to elevator); 07/27/2020 deferred    Time 12    Period Weeks    Status Partially Met    Target Date 08/24/20      PT LONG TERM GOAL #6   Title Patient will be able to stand with RW and lift one leg onto 4" step for carryover to negotiation of steps at home.    Baseline 11/2: unable to perform 1/25: R foot 0.75 inch lift; 07/27/2020 heel not fully off ground, but pt slightly able to lift R LE to tap side of step while holding onto RW with both hands    Time 12    Period Weeks    Status Partially Met    Target Date 08/24/20                 Plan - 08/17/20 1242    Clinical Impression Statement Patient has increased episodes of trembling and fatigue throughout session. She continues to remain highly motivated throughout session despite recent decrease in function from exacerbation. Seated interventions continue to be beneficial for patient for core, LE and UE strengthening. The patient would benefit from continued skilled PT intervention to maximize QOL, safety, and function    Personal Factors and Comorbidities Age;Comorbidity 3+;Fitness;Past/Current Experience;Sex;Time since onset of injury/illness/exacerbation    Comorbidities HTN, SVT,  neurogenic bowel, MS (1995), neuropathy, hx of wrist fx, HPV, Hypercholesteremia, adiposity, osteopenia, spasticity.    Examination-Activity Limitations Bathing;Bed Mobility;Bend;Carry;Continence;Dressing;Hygiene/Grooming;Stairs;Squat;Reach Overhead;Locomotion Level;Lift;Stand;Transfers    Examination-Participation Restrictions Church;Cleaning;Community Activity;Laundry;Volunteer;Shop;Meal Prep;Yard Work    Copy Evolving/Moderate complexity    Rehab Potential Good    PT Frequency 2x / week    PT Duration 12 weeks    PT Treatment/Interventions ADLs/Self Care Home Management;Aquatic Therapy;Biofeedback;Cryotherapy;Electrical Stimulation;Iontophoresis 45m/ml Dexamethasone;Traction;Moist Heat;Ultrasound;DME Instruction;Gait training;Stair training;Functional mobility training;Therapeutic activities;Therapeutic exercise;Balance training;Neuromuscular re-education;Patient/family education;Orthotic Fit/Training;Wheelchair mobility training;Manual techniques;Compression bandaging;Passive range of motion;Dry needling;Energy conservation;Taping;Splinting;Vestibular    PT Next Visit Plan step negotiation, turning directions to the L and R; re-test goals    PT Home Exercise Plan maintain current HEP from [place] Agree with Plan of Care Patient           Patient will benefit from skilled therapeutic intervention in order to improve the following deficits and impairments:  Abnormal gait,Decreased activity tolerance,Decreased balance,Decreased coordination,Decreased endurance,Decreased mobility,Decreased range of motion,Decreased strength,Difficulty walking,Impaired perceived functional ability,Impaired sensation,Impaired tone,Impaired UE functional use,Improper body mechanics,Postural dysfunction,Cardiopulmonary status limiting activity,Impaired flexibility,Increased muscle spasms  Visit Diagnosis: Muscle weakness (generalized)  Other abnormalities of gait and mobility  Unsteadiness on feet     Problem List Patient Active Problem List   Diagnosis Date Noted  . Hypoalbuminemia due to protein-calorie malnutrition ([place]   . Neurogenic bowel   . Neurogenic bladder   . Labile blood pressure   . Neuropathic pain   . Abscess of female pelvis   . SVT (supraventricular tachycardia) ([place]   . Radial styloid tenosynovitis 03/12/2018  . Wheelchair confinement 02/27/2018  . Localized  osteoporosis with current pathological fracture with routine healing 01/19/2017  . Wrist fracture 01/16/2017  . Sprain of ankle 03/23/2016  . Closed fracture of lateral malleolus 03/16/2016  . Health care maintenance 01/24/2016  . Blood pressure elevated without history of HTN 10/25/2015  . Essential hypertension 10/25/2015  . Multiple sclerosis ([place] 10/02/2015  . Chronic left shoulder pain 07/19/2015  . Multiple sclerosis exacerbation ([place] 07/14/2015  . MS (multiple sclerosis) ([place] 11/26/2014  . Increased body mass index 11/26/2014  . HPV test positive 11/26/2014  . Status post laparoscopic supracervical hysterectomy 11/26/2014  . Galactorrhea 11/26/2014  . Back ache 05/21/2014  . Adiposity 05/21/2014  . Disordered sleep 05/21/2014  . Muscle spasticity 05/21/2014  . Spasticity 05/21/2014  . Calculus of kidney 12/09/2013  . Renal colic 068/07/2120 . Hypercholesteremia 08/19/2013  . Hereditary and idiopathic neuropathy 08/19/2013  . Hypercholesterolemia without hypertriglyceridemia 08/19/2013  . Bladder infection, chronic 07/25/2012  . Disorder of bladder function 07/25/2012  . Incomplete bladder emptying 07/25/2012  . Microscopic hematuria 07/25/2012  . Right upper quadrant pain 07/25/2012   MJanna Arch PT, DPT   08/17/2020, 12:43 PM  [facility]MAIN [facility] CenterSERV[facility] [address] [address]Phone: [phone]  Fax:  [phone] Name: Lisa WaldmanRamseur MRN: [id number] of Birth: 608/10/1960

## 2020-08-19 ENCOUNTER — Other Ambulatory Visit: Payer: Self-pay

## 2020-08-19 ENCOUNTER — Ambulatory Visit: Payer: PPO

## 2020-08-19 DIAGNOSIS — R2681 Unsteadiness on feet: Secondary | ICD-10-CM

## 2020-08-19 DIAGNOSIS — M6281 Muscle weakness (generalized): Secondary | ICD-10-CM | POA: Diagnosis not present

## 2020-08-19 DIAGNOSIS — R2689 Other abnormalities of gait and mobility: Secondary | ICD-10-CM

## 2020-08-19 NOTE — Therapy (Signed)
[place] MAIN [facility] SERVICES [address] [place], [place], [address] Phone: [phone]   Fax:  [phone]  Physical Therapy Treatment  Patient Details  Name: Lisa Williams MRN: [id number] Date of Birth: 1956-10-24 Referring Provider (PT): Gurney Maxin, MD   Encounter Date: 08/19/2020   PT End of Session - 08/19/20 1007    Visit Number 36    Number of Visits 91    Date for PT Re-Evaluation 08/24/20    PT Start Time 0321    PT Stop Time 1059    PT Time Calculation (min) 45 min    Equipment Utilized During Treatment Gait belt;Other (comment)    Activity Tolerance Patient tolerated treatment well;Patient limited by fatigue    Behavior During Therapy [facility] for tasks assessed/performed           Past Medical History:  Diagnosis Date  . Abdominal pain, right upper quadrant   . Back pain   . Calculus of kidney 12/09/2013  . Chronic back pain    unspecified  . Chronic left shoulder pain 07/19/2015  . Complication of anesthesia   . Functional disorder of bladder    other  . Galactorrhea 11/26/2014   Chronic   . Hereditary and idiopathic neuropathy 08/19/2013  . HPV test positive   . Hypercholesteremia 08/19/2013  . Hypertension   . Incomplete bladder emptying   . Microscopic hematuria   . MS (multiple sclerosis) ([place])   . Muscle spasticity 05/21/2014  . Nonspecific findings on examination of urine    other  . Osteopenia   . PONV (postoperative nausea and vomiting)   . Status post laparoscopic supracervical hysterectomy 11/26/2014  . Tobacco user 11/26/2014  . Wrist fracture     Past Surgical History:  Procedure Laterality Date  . bilateral tubal ligation  1996  . BREAST CYST EXCISION Left 2002  . FRACTURE SURGERY    . KNEE SURGERY     right  . LAPAROSCOPIC SUPRACERVICAL HYSTERECTOMY  08/05/2013  . ORIF WRIST FRACTURE Left 01/17/2017   Procedure: OPEN REDUCTION INTERNAL FIXATION (ORIF) WRIST FRACTURE;  Surgeon:  Lovell Sheehan, MD;  Location: [facility] OR[facility];  Service: Orthopedics;  Laterality: Left;  . RADIOLOGY WITH ANESTHESIA N/A 03/18/2020   Procedure: MRI WITH ANESTHESIA CERVICAL SPINE AND BRAIN  WITH AND WITHOUT CONTRAST;  Surgeon: Radiologist, Medication, MD;  Location: [place];  Service: Radiology;  Laterality: N/A;  . TUBAL LIGATION Bilateral   . VAGINAL HYSTERECTOMY  03/2006    There were no vitals filed for this visit.   Subjective Assessment - 08/19/20 1340    Subjective Patient reports severe fatigue. Had an episodes of weakness yesterday at [employer] and couldn't get up from chair.    Pertinent History Patient is previous patient of this therapist, was admitted to the hospital on 08/03/19 for MS exacerbation and then went to inpatient rehab on 08/08/19. Patient then received home health therapy and now is returning to outpatient PT. PMh includes HTN, SVT, neurogenic bowel, MS (1995), neuropathy, hx of wrist fx, HPV, Hypercholesteremia, adiposity, osteopenia, spasticity. She wears bilateral AFOs and uses a RW and/or wheelchair for mobility. She drives an adapted car.    Currently in Pain? No/denies              Patient unable to perform standing interventions due to fatigue.     Seated:  RTB ER 15x  RTB around knee: abduction 15x,  Green ball adduction squeeze 15x df/pf into green dynadisc  10x 3 second holds each LE  Hamstring isometric into dynadisc 10x 3 second holds Lateral trunk raises 10x each side   5lb bar:  -row 12x cues for shoulder retraction and depression -straight arm raise 10x, very challenging for patient to perform  -clockwise circle 10x, counterclockwise 10x -straight arm circle 10x each direction   -alternating row 10x each UE  Ice pack donned throughout session to minimize exacerbation of MS symptoms in order to perform interventions.      Pt educated throughout session about proper posture and technique with exercises. Improved exercise technique, movement  at target joints, use of target muscles after min to mod verbal, visual, tactile cues     Patient's session is limited due to patient overheating and severe exhaustion. She is educated on monitoring symptoms and relaxing at home to avoid over-exacerbation of symptoms. Patient is aware of upcoming recert however is still recovering from recent exacerbation. The patient would benefit from continued skilled PT intervention to maximize QOL, safety, and function                    PT Education - 08/19/20 1005    Education provided Yes    Education Details exercise technique, body mechanicss    Person(s) Educated Patient    Methods Explanation;Demonstration;Tactile cues;Verbal cues    Comprehension Verbalized understanding;Returned demonstration;Verbal cues required;Tactile cues required            PT Short Term Goals - 07/27/20 1023      PT SHORT TERM GOAL #1   Title Patient will be independent in home exercise program to improve strength/mobility for better functional independence with ADLs.    Baseline 5/19: HEP next session 6/22: HEP compliant 8/10 : HEP progressions compliant 8/31 HEP compliant 10/5: HEP compliant    Time 4    Period Weeks    Status Achieved    Target Date 01/13/20      PT SHORT TERM GOAL #2   Title Patient will perform a sit to stand with SUE support for increased ind. with transfers.    Baseline 5/19: heavy BUE support 6/22: requires one hand on walker one hand on w/c 8/10: able to perform intermittently (some days can some days cannot) 8/31: attempt next session 10/5: BUE reliance, patient unable to complete 4 full stands. Attempt next session 1/25: able to perform from elevated surface only; 3/22 pt pushes through L arm off of wheelchair with R arm on RW for tactile cue/balance to perform transfer    Time 4    Period Weeks    Status Partially Met    Target Date 08/24/20             PT Long Term Goals - 07/30/20 0001      PT LONG TERM GOAL  #1   Title Patient will increase FOTO score to equal to or greater than  53/100   to demonstrate statistically significant improvement in mobility and quality of life.    Baseline 8/31: 48.13% 7/27:  48.1% 6/22: 46.99% 5/18: 48/100 10/5: 49/100 11/2: 48%  12/30: 46.9% 1/25: 46.99%; 07/27/2020 FOTO 42%    Time 12    Period Weeks    Status Partially Met    Target Date 08/24/20      PT LONG TERM GOAL #2   Title Patient (< 12 years old) will complete five times sit to stand test in < 20 seconds indicating an increased LE strength and improved balance.    Baseline  7/27: 26.8 seconds with one partial stand 6/22: 40. 86 sec; unable to reach full stand last two attempts 5/19: 42 seconds 10/5: BUE reliance, patient unable to complete 4 full stands. Attempt next session 11/2: 23.71 seconds 12/30: 30.32 with one near LOB due to spasms 1/25: 27.11 seconds with one hand on RW one hand on chair; 37.5 seconds with use of BUEs    Time 12    Period Weeks    Status Partially Met    Target Date 08/24/20      PT LONG TERM GOAL #3   Title Patient will increase 10 meter walk test to <20 seconds as to improve gait speed for better community ambulation and to reduce fall risk.    Baseline 8/31: 35.1 seconds with RW 7/27: 36 seconds with RW 6/22: 36.1 seconds 5/19: 42 seconds 10/5: 31.9 seconds with RW 11/2: 29.9 seconds with RW 12/30: deferred due to spasm 1/25: defer to next session 1/27: 29 seconds with RW; 07/27/2020 deferred; 07/29/2020 41.5 sec (0.24 m/s)    Time 12    Period Weeks    Status Partially Met    Target Date 08/24/20      PT LONG TERM GOAL #4   Title Patient will reduce timed up and go to <11 seconds to reduce fall risk and demonstrate improved transfer/gait ability.    Baseline 8/31: 50.8 seconds with RW 7/27: 43.65 6/22: 45.4 seconds 5/19: 47 seconds 10/5: 38.5 seconds with RW 11/2: 35.72 seconds with RW 12/30: 43.5 seconds with wheels wet. 1/25: deferred 1/27: 38.9 seconds; 07/27/2020 82 seconds  with RW, CGA; 07/29/2020 44 seconds    Time 12    Period Weeks    Status Partially Met    Target Date 08/24/20      PT LONG TERM GOAL #5   Title Patient will ambulate from physical therapy gym to elevator down the hall, ride elevator to first floor, and walk to valet with RW and chair follow for increased capacity for mobility and ind.    Baseline 8/31: able to open door ~50 ft with one rest break 8/10: unable to ambulate to elevator without rest break 9/30: able to ambulate to elevator with 3 rest breaks 1/25: 181 ft with 2 seated rest break (to elevator); 07/27/2020 deferred    Time 12    Period Weeks    Status Partially Met    Target Date 08/24/20      PT LONG TERM GOAL #6   Title Patient will be able to stand with RW and lift one leg onto 4" step for carryover to negotiation of steps at home.    Baseline 11/2: unable to perform 1/25: R foot 0.75 inch lift; 07/27/2020 heel not fully off ground, but pt slightly able to lift R LE to tap side of step while holding onto RW with both hands    Time 12    Period Weeks    Status Partially Met    Target Date 08/24/20                 Plan - 08/19/20 1358    Clinical Impression Statement Patient's session is limited due to patient overheating and severe exhaustion. She is educated on monitoring symptoms and relaxing at home to avoid over-exacerbation of symptoms. Patient is aware of upcoming recert however is still recovering from recent exacerbation. The patient would benefit from continued skilled PT intervention to maximize QOL, safety, and function    Personal Factors and Comorbidities Age;Comorbidity  3+;Fitness;Past/Current Experience;Sex;Time since onset of injury/illness/exacerbation    Comorbidities HTN, SVT, neurogenic bowel, MS (1995), neuropathy, hx of wrist fx, HPV, Hypercholesteremia, adiposity, osteopenia, spasticity.    Examination-Activity Limitations Bathing;Bed  Mobility;Bend;Carry;Continence;Dressing;Hygiene/Grooming;Stairs;Squat;Reach Overhead;Locomotion Level;Lift;Stand;Transfers    Examination-Participation Restrictions Church;Cleaning;Community Activity;Laundry;Volunteer;Shop;Meal Prep;Yard Work    [employer] Evolving/Moderate complexity    Rehab Potential Good    PT Frequency 2x / week    PT Duration 12 weeks    PT Treatment/Interventions ADLs/Self Care Home Management;Aquatic Therapy;Biofeedback;Cryotherapy;Electrical Stimulation;Iontophoresis 14m/ml Dexamethasone;Traction;Moist Heat;Ultrasound;DME Instruction;Gait training;Stair training;Functional mobility training;Therapeutic activities;Therapeutic exercise;Balance training;Neuromuscular re-education;Patient/family education;Orthotic Fit/Training;Wheelchair mobility training;Manual techniques;Compression bandaging;Passive range of motion;Dry needling;Energy conservation;Taping;Splinting;Vestibular    PT Next Visit Plan step negotiation, turning directions to the L and R; re-test goals    PT Home Exercise Plan [place] Agree with Plan of Care Patient           Patient will benefit from skilled therapeutic intervention in order to improve the following deficits and impairments:  Abnormal gait,Decreased activity tolerance,Decreased balance,Decreased coordination,Decreased endurance,Decreased mobility,Decreased range of motion,Decreased strength,Difficulty walking,Impaired perceived functional ability,Impaired sensation,Impaired tone,Impaired UE functional use,Improper body mechanics,Postural dysfunction,Cardiopulmonary status limiting activity,Impaired flexibility,Increased muscle spasms  Visit Diagnosis: Muscle weakness (generalized)  Other abnormalities of gait and mobility  Unsteadiness on feet     Problem List Patient Active Problem List   Diagnosis Date Noted  . Hypoalbuminemia due to protein-calorie malnutrition ([place]    . Neurogenic bowel   . Neurogenic bladder   . Labile blood pressure   . Neuropathic pain   . Abscess of female pelvis   . SVT (supraventricular tachycardia) ([place]   . Radial styloid tenosynovitis 03/12/2018  . Wheelchair confinement 02/27/2018  . Localized osteoporosis with current pathological fracture with routine healing 01/19/2017  . Wrist fracture 01/16/2017  . Sprain of ankle 03/23/2016  . Closed fracture of lateral malleolus 03/16/2016  . Health care maintenance 01/24/2016  . Blood pressure elevated without history of HTN 10/25/2015  . Essential hypertension 10/25/2015  . Multiple sclerosis ([place] 10/02/2015  . Chronic left shoulder pain 07/19/2015  . Multiple sclerosis exacerbation ([place] 07/14/2015  . MS (multiple sclerosis) ([place] 11/26/2014  . Increased body mass index 11/26/2014  . HPV test positive 11/26/2014  . Status post laparoscopic supracervical hysterectomy 11/26/2014  . Galactorrhea 11/26/2014  . Back ache 05/21/2014  . Adiposity 05/21/2014  . Disordered sleep 05/21/2014  . Muscle spasticity 05/21/2014  . Spasticity 05/21/2014  . Calculus of kidney 12/09/2013  . Renal colic 032/95/1884 . Hypercholesteremia 08/19/2013  . Hereditary and idiopathic neuropathy 08/19/2013  . Hypercholesterolemia without hypertriglyceridemia 08/19/2013  . Bladder infection, chronic 07/25/2012  . Disorder of bladder function 07/25/2012  . Incomplete bladder emptying 07/25/2012  . Microscopic hematuria 07/25/2012  . Right upper quadrant pain 07/25/2012   MJanna Arch PT, DPT   08/19/2020, 1:59 PM  [place] [facility] LtdSERVICES [address] [address]Phone: [phone]  Fax:  [phone] Name: JRyelle RuvalcabaRamseur MRN: [id number] of Birth: 626-Oct-1962

## 2020-08-24 ENCOUNTER — Ambulatory Visit: Payer: PPO

## 2020-08-26 ENCOUNTER — Other Ambulatory Visit: Payer: Self-pay

## 2020-08-26 ENCOUNTER — Ambulatory Visit: Payer: PPO

## 2020-08-26 DIAGNOSIS — G35D Multiple sclerosis, unspecified: Secondary | ICD-10-CM

## 2020-08-26 DIAGNOSIS — R2689 Other abnormalities of gait and mobility: Secondary | ICD-10-CM

## 2020-08-26 DIAGNOSIS — G35 Multiple sclerosis: Secondary | ICD-10-CM

## 2020-08-26 DIAGNOSIS — M6281 Muscle weakness (generalized): Secondary | ICD-10-CM | POA: Diagnosis not present

## 2020-08-26 DIAGNOSIS — R2681 Unsteadiness on feet: Secondary | ICD-10-CM

## 2020-08-26 NOTE — Therapy (Signed)
[place] MAIN [facility] SERVICES [address] [place], [place], [address] Phone: [phone]   Fax:  [phone]  Physical Therapy Treatment/RECERT  Patient Details  Name: Lisa Williams MRN: [id number] Date of Birth: 1961-04-07 Referring Provider (PT): Gurney Maxin, MD   Encounter Date: 08/26/2020   PT End of Session - 08/26/20 1108    Visit Number [id number]    Number of Visits 100    Date for PT Re-Evaluation 11/18/20    PT Start Time 9628    PT Stop Time 3662    PT Time Calculation (min) 43 min    Equipment Utilized During Treatment Gait belt;Other (comment)    Activity Tolerance Patient tolerated treatment well;Patient limited by fatigue    Behavior During Therapy [facility] for tasks assessed/performed           Past Medical History:  Diagnosis Date  . Abdominal pain, right upper quadrant   . Back pain   . Calculus of kidney 12/09/2013  . Chronic back pain    unspecified  . Chronic left shoulder pain 07/19/2015  . Complication of anesthesia   . Functional disorder of bladder    other  . Galactorrhea 11/26/2014   Chronic   . Hereditary and idiopathic neuropathy 08/19/2013  . HPV test positive   . Hypercholesteremia 08/19/2013  . Hypertension   . Incomplete bladder emptying   . Microscopic hematuria   . MS (multiple sclerosis) ([place])   . Muscle spasticity 05/21/2014  . Nonspecific findings on examination of urine    other  . Osteopenia   . PONV (postoperative nausea and vomiting)   . Status post laparoscopic supracervical hysterectomy 11/26/2014  . Tobacco user 11/26/2014  . Wrist fracture     Past Surgical History:  Procedure Laterality Date  . bilateral tubal ligation  1996  . BREAST CYST EXCISION Left 2002  . FRACTURE SURGERY    . KNEE SURGERY     right  . LAPAROSCOPIC SUPRACERVICAL HYSTERECTOMY  08/05/2013  . ORIF WRIST FRACTURE Left 01/17/2017   Procedure: OPEN REDUCTION INTERNAL FIXATION (ORIF) WRIST FRACTURE;   Surgeon: Lovell Sheehan, MD;  Location: [facility] ORS;  Service: Orthopedics;  Laterality: Left;  . RADIOLOGY WITH ANESTHESIA N/A 03/18/2020   Procedure: MRI WITH ANESTHESIA CERVICAL SPINE AND BRAIN  WITH AND WITHOUT CONTRAST;  Surgeon: Radiologist, Medication, MD;  Location: [place];  Service: Radiology;  Laterality: N/A;  . TUBAL LIGATION Bilateral   . VAGINAL HYSTERECTOMY  03/2006    There were no vitals filed for this visit.   Subjective Assessment - 08/26/20 1108    Subjective Patient has started to improve after her recent MS exacerbation. No falls since last session.    Pertinent History Patient is previous patient of this therapist, was admitted to the hospital on 08/03/19 for MS exacerbation and then went to inpatient rehab on 08/08/19. Patient then received home health therapy and now is returning to outpatient PT. PMh includes HTN, SVT, neurogenic bowel, MS (1995), neuropathy, hx of wrist fx, HPV, Hypercholesteremia, adiposity, osteopenia, spasticity. She wears bilateral AFOs and uses a RW and/or wheelchair for mobility. She drives an adapted car.    Limitations Lifting;Standing;Walking;House hold activities    How long can you sit comfortably? n/a    How long can you stand comfortably? 4 minutes    How long can you walk comfortably? walked 50 ft in inpatient rehab    Patient Stated Goals return to PLOF, increase walking, get into shower, increase  mobility    Currently in Pain? No/denies                  Goals  STS with SUE support FOTO: 47% (IMPROVED)  10 MWT: 32.3 seconds (IMPROVED) TUG: 38 seconds with RW (IMPROVED) 5x STS: 28.2 seconds (IMPROVED) Ambulate from gym to valet able to perform with one seated rest break to elevator.  Stand with RW and lift one leg onto 4" step. : able to clear foot ~3/4 inch with RLE   Treatment: GTB ER 15x GTB straight arm raise 10x GTB abduction around knees 12x Hamstring lengthening 60 seconds x 2 trials.    Ice pack donned  throughout session to minimize exacerbation of MS symptoms in order to perform interventions.   Pt educated throughout session about proper posture and technique with exercises. Improved exercise technique, movement at target joints, use of target muscles after min to mod verbal, visual, tactile cues     Despite patient's recent MS exacerbation she has shown excellent progress towards functional goals. She has improved her gait speed and transfer speed. Sit to stands continue to require UE support at this time however are improving in functionality with increased LE activation. She remains highly motivated despite recent exacerbation and has a good prognosis. The patient would benefit from continued skilled PT intervention to maximize QOL, safety, and function            PT Education - 08/26/20 1108    Education provided Yes    Education Details exercise technique, body mechanics    Person(s) Educated Patient    Methods Explanation;Demonstration;Tactile cues;Verbal cues    Comprehension Verbalized understanding;Returned demonstration;Verbal cues required;Tactile cues required            PT Short Term Goals - 08/26/20 1115      PT SHORT TERM GOAL #1   Title Patient will be independent in home exercise program to improve strength/mobility for better functional independence with ADLs.    Baseline 5/19: HEP next session 6/22: HEP compliant 8/10 : HEP progressions compliant 8/31 HEP compliant 10/5: HEP compliant    Time 4    Period Weeks    Status Achieved    Target Date 01/13/20      PT SHORT TERM GOAL #2   Title Patient will perform a sit to stand with SUE support for increased ind. with transfers.    Baseline 5/19: heavy BUE support 6/22: requires one hand on walker one hand on w/c 8/10: able to perform intermittently (some days can some days cannot) 8/31: attempt next session 10/5: BUE reliance, patient unable to complete 4 full stands. Attempt next session 1/25: able to  perform from elevated surface only; 3/22 pt pushes through L arm off of wheelchair with R arm on RW for tactile cue/balance to perform transfer 4/21: requires use of UE's    Time 4    Period Weeks    Status On-going    Target Date 09/23/20             PT Long Term Goals - 08/26/20 0001      PT LONG TERM GOAL #1   Title Patient will increase FOTO score to equal to or greater than  53/100   to demonstrate statistically significant improvement in mobility and quality of life.    Baseline 8/31: 48.13% 7/27:  48.1% 6/22: 46.99% 5/18: 48/100 10/5: 49/100 11/2: 48%  12/30: 46.9% 1/25: 46.99%; 07/27/2020 FOTO 42% 4/21: 47%    Time 12  Period Weeks    Status Partially Met    Target Date 11/18/20      PT LONG TERM GOAL #2   Title Patient (< 16 years old) will complete five times sit to stand test in < 20 seconds indicating an increased LE strength and improved balance.    Baseline 7/27: 26.8 seconds with one partial stand 6/22: 40. 86 sec; unable to reach full stand last two attempts 5/19: 42 seconds 10/5: BUE reliance, patient unable to complete 4 full stands. Attempt next session 11/2: 23.71 seconds 12/30: 30.32 with one near LOB due to spasms 1/25: 27.11 seconds with one hand on RW one hand on chair; 37.5 seconds with use of BUEs 4/21: 28.2 seconds    Time 12    Period Weeks    Status Partially Met    Target Date 11/18/20      PT LONG TERM GOAL #3   Title Patient will increase 10 meter walk test to <20 seconds as to improve gait speed for better community ambulation and to reduce fall risk.    Baseline 8/31: 35.1 seconds with RW 7/27: 36 seconds with RW 6/22: 36.1 seconds 5/19: 42 seconds 10/5: 31.9 seconds with RW 11/2: 29.9 seconds with RW 12/30: deferred due to spasm 1/25: defer to next session 1/27: 29 seconds with RW; 07/27/2020 deferred; 07/29/2020 41.5 sec (0.24 m/s) 4/21: 32.3 seconds with RW    Time 12    Period Weeks    Status Partially Met    Target Date 11/18/20      PT LONG  TERM GOAL #4   Title Patient will reduce timed up and go to <11 seconds to reduce fall risk and demonstrate improved transfer/gait ability.    Baseline 8/31: 50.8 seconds with RW 7/27: 43.65 6/22: 45.4 seconds 5/19: 47 seconds 10/5: 38.5 seconds with RW 11/2: 35.72 seconds with RW 12/30: 43.5 seconds with wheels wet. 1/25: deferred 1/27: 38.9 seconds; 07/27/2020 82 seconds with RW, CGA; 07/29/2020 44 seconds 4/21: 38 seconds with RW    Time 12    Period Weeks    Status Partially Met    Target Date 11/18/20      PT LONG TERM GOAL #5   Title Patient will ambulate from physical therapy gym to elevator down the hall, ride elevator to first floor, and walk to valet with RW and chair follow for increased capacity for mobility and ind.    Baseline 8/31: able to open door ~50 ft with one rest break 8/10: unable to ambulate to elevator without rest break 9/30: able to ambulate to elevator with 3 rest breaks 1/25: 181 ft with 2 seated rest break (to elevator); 07/27/2020 deferred 4/21 able to perform with one seated rest break to elevator unable to get into elevator yet    Time 12    Period Weeks    Status Partially Met    Target Date 11/18/20      PT LONG TERM GOAL #6   Title Patient will be able to stand with RW and lift one leg onto 4" step for carryover to negotiation of steps at home.    Baseline 11/2: unable to perform 1/25: R foot 0.75 inch lift; 07/27/2020 heel not fully off ground, but pt slightly able to lift R LE to tap side of step while holding onto RW with both hands 4/21: able to clear foot ~3/4 inch with RLE    Time 12    Period Weeks    Status Partially  Met    Target Date 11/18/20                 Plan - 08/26/20 1110    Clinical Impression Statement Despite patient's recent MS exacerbation she has shown excellent progress towards functional goals. She has improved her gait speed and transfer speed. Sit to stands continue to require UE support at this time however are improving in  functionality with increased LE activation. She remains highly motivated despite recent exacerbation and has a good prognosis. The patient would benefit from continued skilled PT intervention to maximize QOL, safety, and function    Personal Factors and Comorbidities Age;Comorbidity 3+;Fitness;Past/Current Experience;Sex;Time since onset of injury/illness/exacerbation    Comorbidities HTN, SVT, neurogenic bowel, MS (1995), neuropathy, hx of wrist fx, HPV, Hypercholesteremia, adiposity, osteopenia, spasticity.    Examination-Activity Limitations Bathing;Bed Mobility;Bend;Carry;Continence;Dressing;Hygiene/Grooming;Stairs;Squat;Reach Overhead;Locomotion Level;Lift;Stand;Transfers    Examination-Participation Restrictions Church;Cleaning;Community Activity;Laundry;Volunteer;Shop;Meal Prep;Yard Work    [employer] Evolving/Moderate complexity    Rehab Potential Good    PT Frequency 2x / week    PT Duration 12 weeks    PT Treatment/Interventions ADLs/Self Care Home Management;Aquatic Therapy;Biofeedback;Cryotherapy;Electrical Stimulation;Iontophoresis 4mg /ml Dexamethasone;Traction;Moist Heat;Ultrasound;DME Instruction;Gait training;Stair training;Functional mobility training;Therapeutic activities;Therapeutic exercise;Balance training;Neuromuscular re-education;Patient/family education;Orthotic Fit/Training;Wheelchair mobility training;Manual techniques;Compression bandaging;Passive range of motion;Dry needling;Energy conservation;Taping;Splinting;Vestibular    PT Next Visit Plan step negotiation, turning directions to the L and R; re-test goals    PT Home Exercise Plan maintain current HEP from [place] and Agree with Plan of Care Patient           Patient will benefit from skilled therapeutic intervention in order to improve the following deficits and impairments:  Abnormal gait,Decreased activity tolerance,Decreased balance,Decreased coordination,Decreased  endurance,Decreased mobility,Decreased range of motion,Decreased strength,Difficulty walking,Impaired perceived functional ability,Impaired sensation,Impaired tone,Impaired UE functional use,Improper body mechanics,Postural dysfunction,Cardiopulmonary status limiting activity,Impaired flexibility,Increased muscle spasms  Visit Diagnosis: Muscle weakness (generalized)  Other abnormalities of gait and mobility  Unsteadiness on feet  Multiple sclerosis exacerbation ([place])     Problem List Patient Active Problem List   Diagnosis Date Noted  . Hypoalbuminemia due to protein-calorie malnutrition ([place])   . Neurogenic bowel   . Neurogenic bladder   . Labile blood pressure   . Neuropathic pain   . Abscess of female pelvis   . SVT (supraventricular tachycardia) ([place])   . Radial styloid tenosynovitis 03/12/2018  . Wheelchair confinement 02/27/2018  . Localized osteoporosis with current pathological fracture with routine healing 01/19/2017  . Wrist fracture 01/16/2017  . Sprain of ankle 03/23/2016  . Closed fracture of lateral malleolus 03/16/2016  . Health care maintenance 01/24/2016  . Blood pressure elevated without history of HTN 10/25/2015  . Essential hypertension 10/25/2015  . Multiple sclerosis ([place]) 10/02/2015  . Chronic left shoulder pain 07/19/2015  . Multiple sclerosis exacerbation ([place]) 07/14/2015  . MS (multiple sclerosis) ([place]) 11/26/2014  . Increased body mass index 11/26/2014  . HPV test positive 11/26/2014  . Status post laparoscopic supracervical hysterectomy 11/26/2014  . Galactorrhea 11/26/2014  . Back ache 05/21/2014  . Adiposity 05/21/2014  . Disordered sleep 05/21/2014  . Muscle spasticity 05/21/2014  . Spasticity 05/21/2014  . Calculus of kidney 12/09/2013  . Renal colic 37/62/8315  . Hypercholesteremia 08/19/2013  . Hereditary and idiopathic neuropathy 08/19/2013  . Hypercholesterolemia without hypertriglyceridemia 08/19/2013  . Bladder infection,  chronic 07/25/2012  . Disorder of bladder function 07/25/2012  . Incomplete bladder emptying 07/25/2012  . Microscopic hematuria 07/25/2012  . Right upper quadrant pain 07/25/2012   Janna Arch,  PT, DPT   08/26/2020, 11:54 AM  [place] MAIN [facility] SERVICES [address] [place], [place], [address] Phone: [phone]   Fax:  [phone]  Name: Lisa Williams MRN: [id number] Date of Birth: [birth year]

## 2020-08-26 NOTE — Addendum Note (Signed)
Addended by: Claudie Fisherman on: 08/26/2020 11:56 AM   Modules accepted: Orders

## 2020-08-31 ENCOUNTER — Ambulatory Visit (INDEPENDENT_AMBULATORY_CARE_PROVIDER_SITE_OTHER): Payer: PPO | Admitting: Neurology

## 2020-08-31 ENCOUNTER — Encounter: Payer: Self-pay | Admitting: Neurology

## 2020-08-31 ENCOUNTER — Other Ambulatory Visit: Payer: Self-pay

## 2020-08-31 VITALS — BP 137/68 | HR 75 | Ht 71.0 in | Wt 262.0 lb

## 2020-08-31 DIAGNOSIS — G35 Multiple sclerosis: Secondary | ICD-10-CM | POA: Diagnosis not present

## 2020-08-31 NOTE — Progress Notes (Signed)
Reason for visit: Multiple sclerosis  Lisa Williams is an 60 y.o. female  History of present illness:  Lisa Williams is a 60 year old right-handed black female with a history of multiple sclerosis with a paraparesis.  The patient recently had an exacerbation with numbness and some slight weakness of the right leg.  She has not had any alteration in her ability to ambulate, she is actively working with physical therapy.  She is on Mayzent, she tolerates the drug quite well.  She is able to walk about 180 feet with several rests using a walker.  She has a vehicle with hand controls and she is able to drive.  She has not noted any change in function or sensation of her arms, she has not had any vision changes.  She reports no falls.  Past Medical History:  Diagnosis Date  . Abdominal pain, right upper quadrant   . Back pain   . Calculus of kidney 12/09/2013  . Chronic back pain    unspecified  . Chronic left shoulder pain 07/19/2015  . Complication of anesthesia   . Functional disorder of bladder    other  . Galactorrhea 11/26/2014   Chronic   . Hereditary and idiopathic neuropathy 08/19/2013  . HPV test positive   . Hypercholesteremia 08/19/2013  . Hypertension   . Incomplete bladder emptying   . Microscopic hematuria   . MS (multiple sclerosis) ([facility])   . Muscle spasticity 05/21/2014  . Nonspecific findings on examination of urine    other  . Osteopenia   . PONV (postoperative nausea and vomiting)   . Status post laparoscopic supracervical hysterectomy 11/26/2014  . Tobacco user 11/26/2014  . Wrist fracture     Past Surgical History:  Procedure Laterality Date  . bilateral tubal ligation  1996  . BREAST CYST EXCISION Left 2002  . FRACTURE SURGERY    . KNEE SURGERY     right  . LAPAROSCOPIC SUPRACERVICAL HYSTERECTOMY  08/05/2013  . ORIF WRIST FRACTURE Left 01/17/2017   Procedure: OPEN REDUCTION INTERNAL FIXATION (ORIF) WRIST FRACTURE;  Surgeon: Lyndle Herrlich, MD;   Location: [facility] ORS;  Service: Orthopedics;  Laterality: Left;  . RADIOLOGY WITH ANESTHESIA N/A 03/18/2020   Procedure: MRI WITH ANESTHESIA CERVICAL SPINE AND BRAIN  WITH AND WITHOUT CONTRAST;  Surgeon: Radiologist, Medication, MD;  Location: [facility] OR;  Service: Radiology;  Laterality: N/A;  . TUBAL LIGATION Bilateral   . VAGINAL HYSTERECTOMY  03/2006    Family History  Problem Relation Age of Onset  . Sickle cell trait Sister   . Hypertension Sister   . Supraventricular tachycardia Sister   . Hypertension Sister   . Diabetes Maternal Grandmother   . Liver cancer Maternal Grandmother   . Diabetes Maternal Aunt   . Breast cancer Maternal Aunt        great MAT  . Ovarian cancer Paternal Grandmother   . Nephrolithiasis Father   . Hypertension Father   . Prostate cancer Father   . Kidney disease Father   . Heart failure Father   . Hypertension Sister   . Osteoarthritis Mother   . Hypertension Mother   . Hyperlipidemia Mother   . GU problems Neg Hx   . Urolithiasis Neg Hx     Social history:  reports that she has quit smoking. Her smoking use included cigarettes. She smoked 0.50 packs per day. She has never used smokeless tobacco. She reports that she does not drink alcohol and does not use drugs.  Allergies  Allergen Reactions  . Amlodipine Swelling  . Povidone Iodine Rash    Medications:  Prior to Admission medications   Medication Sig Start Date End Date Taking? Authorizing Provider  acetaminophen (TYLENOL) 500 MG tablet Take 500-1,000 mg by mouth every 6 (six) hours as needed for moderate pain or headache.   Yes [provider]  baclofen (LIORESAL) 20 MG tablet Take 1 tablet (20 mg total) by mouth 3 (three) times daily. 04/08/20  Yes York Spaniel, MD  Cholecalciferol 25 MCG (1000 UT) tablet Take 2,000 Units by mouth daily.   Yes [provider]  gabapentin (NEURONTIN) 100 MG capsule Take 1 capsule (100 mg total) by mouth 2 (two) times daily. 04/08/20   Yes York Spaniel, MD  ibandronate (BONIVA) 150 MG tablet Take 150 mg by mouth every 30 (thirty) days.  05/12/19  Yes [provider]  oxybutynin (DITROPAN-XL) 10 MG 24 hr tablet Take 1 tablet (10 mg total) by mouth daily. 04/06/20  Yes Vanna Scotland, MD  predniSONE (DELTASONE) 5 MG tablet Begin taking 6 tablets daily, taper by one tablet every other day until off the medication. 08/05/20  Yes York Spaniel, MD  Siponimod Fumarate (MAYZENT) 2 MG TABS Take 2 mg by mouth daily.   Yes York Spaniel, MD  sulfamethoxazole-trimethoprim (BACTRIM DS) 800-160 MG tablet Take 1 tablet by mouth 2 (two) times daily. 08/11/20  Yes York Spaniel, MD  telmisartan (MICARDIS) 40 MG tablet Take 40 mg by mouth daily.   Yes [provider]  tiZANidine (ZANAFLEX) 2 MG tablet TAKE 1 TABLET(2 MG) BY MOUTH THREE TIMES DAILY 08/10/20  Yes York Spaniel, MD    ROS:  Out of a complete 14 system review of symptoms, the patient complains only of the following symptoms, and all other reviewed systems are negative.  Walking difficulty Leg numbness  Blood pressure 137/68, pulse 75, height 5\' 11"  (1.803 m), weight 262 lb (118.8 kg).  Physical Exam  General: The patient is alert and cooperative at the time of the examination.  The patient is moderately obese.  Skin: No significant peripheral edema is noted.   Neurologic Exam  Mental status: The patient is alert and oriented x 3 at the time of the examination. The patient has apparent normal recent and remote memory, with an apparently normal attention span and concentration ability.   Cranial nerves: Facial symmetry is present. Speech is normal, no aphasia or dysarthria is noted. Extraocular movements are full. Visual fields are full.  Pupils are equal, round, and reactive to light.  Discs are flat bilaterally.  Motor: The patient has good strength in the upper extremities.  With the lower extremities, the patient is unable to flex the  hips against gravity on either side, she can partially extend the knees with support under the knees.  She wears bilateral AFO braces.  Sensory examination: Soft touch sensation is symmetric on the face, arms, and legs.  Coordination: The patient has good finger-nose-finger bilaterally.  She is not able to perform heel-to-shin on either side.  Gait and station: The patient is able to stand using a walker, she can walk short distance with a walker.  There is a bilateral circumduction type gait.  Reflexes: Deep tendon reflexes are symmetric.   MRI brain 03/18/20:  IMPRESSION: Scattered foci of abnormal T2 and FLAIR signal within the cerebral hemispheric white matter, 10-20 foci in each hemisphere. Some cortical and subcortical involvement particularly at the right parietal vertex.  The findings are consistent with chronic multiple sclerosis. No lesions show restricted diffusion or contrast Enhancement.   MRI cervical 03/18/20:  IMPRESSION: 1. Multiple foci of abnormal T2 and FLAIR signal affecting the cervical and upper thoracic spinal cord consistent with demyelinating disease. No areas show abnormal contrast enhancement. See above for more detailed description of cord lesions. 2. C6-7: Central to right-sided disc herniation. Spinal stenosis to the right of midline with AP diameter only 5.3 mm. Effacement of the subarachnoid space and some cord deformity on the right. Disc material also extends into the proximal foramen on the left. Foraminal stenosis left worse than right could affect the exiting C7 nerves. 3. C5-6: Central to left-sided disc herniation. No cord compression. Moderate left foraminal narrowing.    Assessment/Plan:  1.  Multiple sclerosis  2.  Gait disorder  The patient had a recent exacerbation with her multiple sclerosis.  She did have a bladder infection that was treated and she was given a course of steroids.  She is working with physical therapy  currently.  She will continue the Mayzent, there has been a mild elevation in alkaline phosphatase and liver enzymes, this will be followed.  She will follow-up otherwise in 5 months.  Marlan Palau MD 08/31/2020 11:25 AM  [facility] [address] [place], [place] [address]  Phone [phone] Fax [phone]

## 2020-09-02 ENCOUNTER — Other Ambulatory Visit: Payer: Self-pay

## 2020-09-02 ENCOUNTER — Ambulatory Visit: Payer: PPO

## 2020-09-02 DIAGNOSIS — M6281 Muscle weakness (generalized): Secondary | ICD-10-CM | POA: Diagnosis not present

## 2020-09-02 DIAGNOSIS — G35 Multiple sclerosis: Secondary | ICD-10-CM

## 2020-09-02 DIAGNOSIS — R2681 Unsteadiness on feet: Secondary | ICD-10-CM

## 2020-09-02 DIAGNOSIS — R2689 Other abnormalities of gait and mobility: Secondary | ICD-10-CM

## 2020-09-02 NOTE — Therapy (Signed)
[place] MAIN [facility] SERVICES [address] [place], [place], [address] Phone: [phone]   Fax:  [phone]  Physical Therapy Treatment  Patient Details  Name: Lisa Williams MRN: [id number] Date of Birth: 1960/10/02 Referring Provider (PT): Gurney Maxin, MD   Encounter Date: 09/02/2020   PT End of Session - 09/02/20 1004    Visit Number 28    Number of Visits 100    Date for PT Re-Evaluation 11/18/20    PT Start Time 0623    PT Stop Time 1059    PT Time Calculation (min) 45 min    Equipment Utilized During Treatment Gait belt;Other (comment)    Activity Tolerance Patient tolerated treatment well;Patient limited by fatigue    Behavior During Therapy [facility] for tasks assessed/performed           Past Medical History:  Diagnosis Date  . Abdominal pain, right upper quadrant   . Back pain   . Calculus of kidney 12/09/2013  . Chronic back pain    unspecified  . Chronic left shoulder pain 07/19/2015  . Complication of anesthesia   . Functional disorder of bladder    other  . Galactorrhea 11/26/2014   Chronic   . Hereditary and idiopathic neuropathy 08/19/2013  . HPV test positive   . Hypercholesteremia 08/19/2013  . Hypertension   . Incomplete bladder emptying   . Microscopic hematuria   . MS (multiple sclerosis) ([place])   . Muscle spasticity 05/21/2014  . Nonspecific findings on examination of urine    other  . Osteopenia   . PONV (postoperative nausea and vomiting)   . Status post laparoscopic supracervical hysterectomy 11/26/2014  . Tobacco user 11/26/2014  . Wrist fracture     Past Surgical History:  Procedure Laterality Date  . bilateral tubal ligation  1996  . BREAST CYST EXCISION Left 2002  . FRACTURE SURGERY    . KNEE SURGERY     right  . LAPAROSCOPIC SUPRACERVICAL HYSTERECTOMY  08/05/2013  . ORIF WRIST FRACTURE Left 01/17/2017   Procedure: OPEN REDUCTION INTERNAL FIXATION (ORIF) WRIST FRACTURE;  Surgeon:  Lovell Sheehan, MD;  Location: [facility] OR[facility];  Service: Orthopedics;  Laterality: Left;  . RADIOLOGY WITH ANESTHESIA N/A 03/18/2020   Procedure: MRI WITH ANESTHESIA CERVICAL SPINE AND BRAIN  WITH AND WITHOUT CONTRAST;  Surgeon: Radiologist, Medication, MD;  Location: [facility];  Service: Radiology;  Laterality: N/A;  . TUBAL LIGATION Bilateral   . VAGINAL HYSTERECTOMY  03/2006    There were no vitals filed for this visit.   Subjective Assessment - 09/02/20 1221    Subjective Patient has seen neurologist since last session, reports her physician is retiring. No changes to medicine at this time. No falls or LOB since last session.    Pertinent History Patient is previous patient of this therapist, was admitted to the hospital on 08/03/19 for MS exacerbation and then went to inpatient rehab on 08/08/19. Patient then received home health therapy and now is returning to outpatient PT. PMh includes HTN, SVT, neurogenic bowel, MS (1995), neuropathy, hx of wrist fx, HPV, Hypercholesteremia, adiposity, osteopenia, spasticity. She wears bilateral AFOs and uses a RW and/or wheelchair for mobility. She drives an adapted car.    Limitations Lifting;Standing;Walking;House hold activities    How long can you sit comfortably? n/a    How long can you stand comfortably? 4 minutes    How long can you walk comfortably? walked 50 ft in inpatient rehab    Patient  Stated Goals return to PLOF, increase walking, get into shower, increase mobility    Currently in Pain? No/denies                      Gait: Patient ambulates >149f, 2 seated rest breaks CGA and wheelchair follow for safety. Ambulate from PT waiting room to elevators, walking into elevator turning around in narrow space, stabilizing against pertubation of elevator, exiting elevator at next level and walking to valet.  Patient able to navigate opening doors, turns, and thresholds independently but with inc time to perform. Patient demonstrates inc reliance on  BUE by end of gait trial. This is patients first attempt and success at elevator in standing position.     Seated:  RTB row 15x each LE RTB Y elevation 12x RTB ER 12x Lateral side bends 10x each side Green ball between knees adduction 10x 3 second holds RTB around knees: abduction 10x very challenging for patient to perform.    Ice pack donned throughout session to minimize exacerbation of MS symptoms in order to perform interventions.    Pt educated throughout session about proper posture and technique with exercises. Improved exercise technique, movement at target joints, use of target muscles after min to mod verbal, visual, tactile cues   Patient successfully negotiated ambulation from PT waiting room to the elevator, into elevator, and [place] She successfully stood in moving elevator without LOB for first and first successful attempt indicating improved stability and capacity for functional mobility with two seated rest breaks. The patient would benefit from continued skilled PT intervention to maximize QOL, safety, and function                  PT Education - 09/02/20 1003    Education provided Yes    Education Details exercise technique, body mechanics    Person(s) Educated Patient    Methods Explanation;Demonstration;Tactile cues;Verbal cues    Comprehension Verbalized understanding;Returned demonstration;Verbal cues required;Tactile cues required            PT Short Term Goals - 08/26/20 1115      PT SHORT TERM GOAL #1   Title Patient will be independent in home exercise program to improve strength/mobility for better functional independence with ADLs.    Baseline 5/19: HEP next session 6/22: HEP compliant 8/10 : HEP progressions compliant 8/31 HEP compliant 10/5: HEP compliant    Time 4    Period Weeks    Status Achieved    Target Date 01/13/20      PT SHORT TERM GOAL #2   Title Patient will perform a sit to stand with SUE support for  increased ind. with transfers.    Baseline 5/19: heavy BUE support 6/22: requires one hand on walker one hand on w/c 8/10: able to perform intermittently (some days can some days cannot) 8/31: attempt next session 10/5: BUE reliance, patient unable to complete 4 full stands. Attempt next session 1/25: able to perform from elevated surface only; 3/22 pt pushes through L arm off of wheelchair with R arm on RW for tactile cue/balance to perform transfer 4/21: requires use of UE's    Time 4    Period Weeks    Status On-going    Target Date 09/23/20             PT Long Term Goals - 08/26/20 0001      PT LONG TERM GOAL #1   Title Patient will increase FOTO score  to equal to or greater than  53/100   to demonstrate statistically significant improvement in mobility and quality of life.    Baseline 8/31: 48.13% 7/27:  48.1% 6/22: 46.99% 5/18: 48/100 10/5: 49/100 11/2: 48%  12/30: 46.9% 1/25: 46.99%; 07/27/2020 FOTO 42% 4/21: 47%    Time 12    Period Weeks    Status Partially Met    Target Date 11/18/20      PT LONG TERM GOAL #2   Title Patient (< 42 years old) will complete five times sit to stand test in < 20 seconds indicating an increased LE strength and improved balance.    Baseline 7/27: 26.8 seconds with one partial stand 6/22: 40. 86 sec; unable to reach full stand last two attempts 5/19: 42 seconds 10/5: BUE reliance, patient unable to complete 4 full stands. Attempt next session 11/2: 23.71 seconds 12/30: 30.32 with one near LOB due to spasms 1/25: 27.11 seconds with one hand on RW one hand on chair; 37.5 seconds with use of BUEs 4/21: 28.2 seconds    Time 12    Period Weeks    Status Partially Met    Target Date 11/18/20      PT LONG TERM GOAL #3   Title Patient will increase 10 meter walk test to <20 seconds as to improve gait speed for better community ambulation and to reduce fall risk.    Baseline 8/31: 35.1 seconds with RW 7/27: 36 seconds with RW 6/22: 36.1 seconds 5/19: 42  seconds 10/5: 31.9 seconds with RW 11/2: 29.9 seconds with RW 12/30: deferred due to spasm 1/25: defer to next session 1/27: 29 seconds with RW; 07/27/2020 deferred; 07/29/2020 41.5 sec (0.24 m/s) 4/21: 32.3 seconds with RW    Time 12    Period Weeks    Status Partially Met    Target Date 11/18/20      PT LONG TERM GOAL #4   Title Patient will reduce timed up and go to <11 seconds to reduce fall risk and demonstrate improved transfer/gait ability.    Baseline 8/31: 50.8 seconds with RW 7/27: 43.65 6/22: 45.4 seconds 5/19: 47 seconds 10/5: 38.5 seconds with RW 11/2: 35.72 seconds with RW 12/30: 43.5 seconds with wheels wet. 1/25: deferred 1/27: 38.9 seconds; 07/27/2020 82 seconds with RW, CGA; 07/29/2020 44 seconds 4/21: 38 seconds with RW    Time 12    Period Weeks    Status Partially Met    Target Date 11/18/20      PT LONG TERM GOAL #5   Title Patient will ambulate from physical therapy gym to elevator down the hall, ride elevator to first floor, and walk to valet with RW and chair follow for increased capacity for mobility and ind.    Baseline 8/31: able to open door ~50 ft with one rest break 8/10: unable to ambulate to elevator without rest break 9/30: able to ambulate to elevator with 3 rest breaks 1/25: 181 ft with 2 seated rest break (to elevator); 07/27/2020 deferred 4/21 able to perform with one seated rest break to elevator unable to get into elevator yet    Time 12    Period Weeks    Status Partially Met    Target Date 11/18/20      PT LONG TERM GOAL #6   Title Patient will be able to stand with RW and lift one leg onto 4" step for carryover to negotiation of steps at home.    Baseline 11/2: unable to perform  1/25: R foot 0.75 inch lift; 07/27/2020 heel not fully off ground, but pt slightly able to lift R LE to tap side of step while holding onto RW with both hands 4/21: able to clear foot ~3/4 inch with RLE    Time 12    Period Weeks    Status Partially Met    Target Date 11/18/20                  Plan - 09/02/20 1228    Clinical Impression Statement Patient successfully negotiated ambulation from PT waiting room to the elevator, into elevator, and up to [place] parking. She successfully stood in moving elevator without LOB for first and first successful attempt indicating improved stability and capacity for functional mobility with two seated rest breaks. The patient would benefit from continued skilled PT intervention to maximize QOL, safety, and function    Personal Factors and Comorbidities Age;Comorbidity 3+;Fitness;Past/Current Experience;Sex;Time since onset of injury/illness/exacerbation    Comorbidities HTN, SVT, neurogenic bowel, MS (1995), neuropathy, hx of wrist fx, HPV, Hypercholesteremia, adiposity, osteopenia, spasticity.    Examination-Activity Limitations Bathing;Bed Mobility;Bend;Carry;Continence;Dressing;Hygiene/Grooming;Stairs;Squat;Reach Overhead;Locomotion Level;Lift;Stand;Transfers    Examination-Participation Restrictions Church;Cleaning;Community Activity;Laundry;Volunteer;Shop;Meal Prep;Yard Work    [employer] Evolving/Moderate complexity    Rehab Potential Good    PT Frequency 2x / week    PT Duration 12 weeks    PT Treatment/Interventions ADLs/Self Care Home Management;Aquatic Therapy;Biofeedback;Cryotherapy;Electrical Stimulation;Iontophoresis 55m/ml Dexamethasone;Traction;Moist Heat;Ultrasound;DME Instruction;Gait training;Stair training;Functional mobility training;Therapeutic activities;Therapeutic exercise;Balance training;Neuromuscular re-education;Patient/family education;Orthotic Fit/Training;Wheelchair mobility training;Manual techniques;Compression bandaging;Passive range of motion;Dry needling;Energy conservation;Taping;Splinting;Vestibular    PT Next Visit Plan step negotiation, turning directions to the L and R; re-test goals    PT Home Exercise Plan maintain current HEP from [facility]gree with  Plan of Care Patient           Patient will benefit from skilled therapeutic intervention in order to improve the following deficits and impairments:  Abnormal gait,Decreased activity tolerance,Decreased balance,Decreased coordination,Decreased endurance,Decreased mobility,Decreased range of motion,Decreased strength,Difficulty walking,Impaired perceived functional ability,Impaired sensation,Impaired tone,Impaired UE functional use,Improper body mechanics,Postural dysfunction,Cardiopulmonary status limiting activity,Impaired flexibility,Increased muscle spasms  Visit Diagnosis: Muscle weakness (generalized)  Other abnormalities of gait and mobility  Unsteadiness on feet  Multiple sclerosis exacerbation ([place]     Problem List Patient Active Problem List   Diagnosis Date Noted  . Hypoalbuminemia due to protein-calorie malnutrition ([place]   . Neurogenic bowel   . Neurogenic bladder   . Labile blood pressure   . Neuropathic pain   . Abscess of female pelvis   . SVT (supraventricular tachycardia) ([place]   . Radial styloid tenosynovitis 03/12/2018  . Wheelchair confinement 02/27/2018  . Localized osteoporosis with current pathological fracture with routine healing 01/19/2017  . Wrist fracture 01/16/2017  . Sprain of ankle 03/23/2016  . Closed fracture of lateral malleolus 03/16/2016  . Health care maintenance 01/24/2016  . Blood pressure elevated without history of HTN 10/25/2015  . Essential hypertension 10/25/2015  . Multiple sclerosis ([place] 10/02/2015  . Chronic left shoulder pain 07/19/2015  . Multiple sclerosis exacerbation ([place] 07/14/2015  . MS (multiple sclerosis) ([place] 11/26/2014  . Increased body mass index 11/26/2014  . HPV test positive 11/26/2014  . Status post laparoscopic supracervical hysterectomy 11/26/2014  . Galactorrhea 11/26/2014  . Back ache 05/21/2014  . Adiposity 05/21/2014  . Disordered sleep 05/21/2014  . Muscle spasticity 05/21/2014  . Spasticity  05/21/2014  . Calculus of kidney 12/09/2013  . Renal colic 081/44/8185 . Hypercholesteremia 08/19/2013  .  Hereditary and idiopathic neuropathy 08/19/2013  . Hypercholesterolemia without hypertriglyceridemia 08/19/2013  . Bladder infection, chronic 07/25/2012  . Disorder of bladder function 07/25/2012  . Incomplete bladder emptying 07/25/2012  . Microscopic hematuria 07/25/2012  . Right upper quadrant pain 07/25/2012   Janna Arch, PT, DPT   09/02/2020, 12:29 PM  [place] MAIN [facility] SERVICES [address] [place], [place], [address] Phone: [phone]   Fax:  [phone]  Name: Jenicka Coxe Hermiz MRN: [id number] Date of Birth: 14-Mar-1961

## 2020-09-07 ENCOUNTER — Other Ambulatory Visit: Payer: Self-pay

## 2020-09-07 ENCOUNTER — Ambulatory Visit: Payer: PPO | Attending: Neurology

## 2020-09-07 DIAGNOSIS — R2681 Unsteadiness on feet: Secondary | ICD-10-CM | POA: Diagnosis not present

## 2020-09-07 DIAGNOSIS — M6281 Muscle weakness (generalized): Secondary | ICD-10-CM | POA: Diagnosis not present

## 2020-09-07 DIAGNOSIS — G35 Multiple sclerosis: Secondary | ICD-10-CM | POA: Insufficient documentation

## 2020-09-07 DIAGNOSIS — R2689 Other abnormalities of gait and mobility: Secondary | ICD-10-CM | POA: Diagnosis not present

## 2020-09-07 NOTE — Therapy (Signed)
[facility] SERVICES [place], [place], [address] Phone: [phone]   Fax:  [phone]  Physical Therapy Treatment /Physical Therapy Progress Note   Dates of reporting period  07/27/20  to   09/07/20  Patient Details  Name: Lisa Williams MRN: [id number] Date of Birth: 1960-12-11 Referring Provider (PT): Gurney Maxin, MD   Encounter Date: 09/07/2020   PT End of Session - 09/07/20 1008    Visit Number 90    Number of Visits 100    Date for PT Re-Evaluation 11/18/20    Authorization Type Next session 1/10 PN 5/3    PT Start Time 1014    PT Stop Time 1059    PT Time Calculation (min) 45 min    Equipment Utilized During Treatment Gait belt;Other (comment)    Activity Tolerance Patient tolerated treatment well;Patient limited by fatigue    Behavior During Therapy [facility] for tasks assessed/performed           Past Medical History:  Diagnosis Date  . Abdominal pain, right upper quadrant   . Back pain   . Calculus of kidney 12/09/2013  . Chronic back pain    unspecified  . Chronic left shoulder pain 07/19/2015  . Complication of anesthesia   . Functional disorder of bladder    other  . Galactorrhea 11/26/2014   Chronic   . Hereditary and idiopathic neuropathy 08/19/2013  . HPV test positive   . Hypercholesteremia 08/19/2013  . Hypertension   . Incomplete bladder emptying   . Microscopic hematuria   . MS (multiple sclerosis) ([place])   . Muscle spasticity 05/21/2014  . Nonspecific findings on examination of urine    other  . Osteopenia   . PONV (postoperative nausea and vomiting)   . Status post laparoscopic supracervical hysterectomy 11/26/2014  . Tobacco user 11/26/2014  . Wrist fracture     Past Surgical History:  Procedure Laterality Date  . bilateral tubal ligation  1996  . BREAST CYST EXCISION Left 2002  . FRACTURE SURGERY    . KNEE SURGERY     right  . LAPAROSCOPIC SUPRACERVICAL HYSTERECTOMY   08/05/2013  . ORIF WRIST FRACTURE Left 01/17/2017   Procedure: OPEN REDUCTION INTERNAL FIXATION (ORIF) WRIST FRACTURE;  Surgeon: Lovell Sheehan, MD;  Location: [facility] ORS;  Service: Orthopedics;  Laterality: Left;  . RADIOLOGY WITH ANESTHESIA N/A 03/18/2020   Procedure: MRI WITH ANESTHESIA CERVICAL SPINE AND BRAIN  WITH AND WITHOUT CONTRAST;  Surgeon: Radiologist, Medication, MD;  Location: [place];  Service: Radiology;  Laterality: N/A;  . TUBAL LIGATION Bilateral   . VAGINAL HYSTERECTOMY  03/2006    There were no vitals filed for this visit.   Subjective Assessment - 09/07/20 1230    Subjective Patient reports the heat is making her a little more tired today. Had a great weekend, no negative effects from the vigorous session last session.    Pertinent History Patient is previous patient of this therapist, was admitted to the hospital on 08/03/19 for MS exacerbation and then went to inpatient rehab on 08/08/19. Patient then received home health therapy and now is returning to outpatient PT. PMh includes HTN, SVT, neurogenic bowel, MS (1995), neuropathy, hx of wrist fx, HPV, Hypercholesteremia, adiposity, osteopenia, spasticity. She wears bilateral AFOs and uses a RW and/or wheelchair for mobility. She drives an adapted car.    Limitations Lifting;Standing;Walking;House hold activities    How long can you sit comfortably? n/a    How  long can you stand comfortably? 4 minutes    How long can you walk comfortably? walked 50 ft in inpatient rehab    Patient Stated Goals return to PLOF, increase walking, get into shower, increase mobility    Currently in Pain? No/denies               Progress note; goals performed 4/21 please refer to this note for further details        Standing in // bars:  -Standing weight shifts 2x10 each side; UE support focus on hip shift rather than pull with arm -step over YTB on ground 5x each leg, seated rest break between!  -PVC pipe raise 10x occasional UE support  required      Seated:  Seated boxing: cues for sequencing, body mechanics, and pace/rhythm for functional contraction and timing of muscle recruitment: -Cross body punches to mitts on PT hands with no back support for focused core stabilization with crossing midline 2x 60 seconds  -Cross body punch with secondary combination of elbow for full cross body rotation to PT mitt for trunk stability, coordination, and cardiovascular challenge x 60 seconds -Upper cut to mitts in PT hands for core activation without back support with perturbations 60 seconds  green dynadisc presses 15x each LE; min A for setup Hamstring stretch on PT leg 2x30 seconds; x 2 sets  RTB abduction 15x Adduction ball squeeze 15x Single arm raise with opp UE in straight arm abduction 10x each UE Posterior delt raise 15x     Ice pack donned throughout session to minimize exacerbation of MS symptoms in order to perform interventions.      Pt educated throughout session about proper posture and technique with exercises. Improved exercise technique, movement at target joints, use of target muscles after min to mod verbal, visual, tactile cues    Patient's condition has the potential to improve in response to therapy. Maximum improvement is yet to be obtained. The anticipated improvement is attainable and reasonable in a generally predictable time.  Patient reports she is more independent but not yet where she would like to be.      Patient's goals performed on 01/04/92 for recert. Please refer to this note for details on progression towards goals at this time. Patient's condition has the potential to improve in response to therapy. Maximum improvement is yet to be obtained. The anticipated improvement is attainable and reasonable in a generally predictable time.The patient would benefit from continued skilled PT intervention to maximize QOL, safety, and function                         PT Education -  09/07/20 1007    Education provided Yes    Education Details exercise technique, body mechanics    Person(s) Educated Patient    Methods Explanation;Demonstration;Tactile cues;Verbal cues    Comprehension Verbalized understanding;Returned demonstration;Verbal cues required;Tactile cues required            PT Short Term Goals - 08/26/20 1115      PT SHORT TERM GOAL #1   Title Patient will be independent in home exercise program to improve strength/mobility for better functional independence with ADLs.    Baseline 5/19: HEP next session 6/22: HEP compliant 8/10 : HEP progressions compliant 8/31 HEP compliant 10/5: HEP compliant    Time 4    Period Weeks    Status Achieved    Target Date 01/13/20      PT SHORT  TERM GOAL #2   Title Patient will perform a sit to stand with SUE support for increased ind. with transfers.    Baseline 5/19: heavy BUE support 6/22: requires one hand on walker one hand on w/c 8/10: able to perform intermittently (some days can some days cannot) 8/31: attempt next session 10/5: BUE reliance, patient unable to complete 4 full stands. Attempt next session 1/25: able to perform from elevated surface only; 3/22 pt pushes through L arm off of wheelchair with R arm on RW for tactile cue/balance to perform transfer 4/21: requires use of UE's    Time 4    Period Weeks    Status On-going    Target Date 09/23/20             PT Long Term Goals - 08/26/20 0001      PT LONG TERM GOAL #1   Title Patient will increase FOTO score to equal to or greater than  53/100   to demonstrate statistically significant improvement in mobility and quality of life.    Baseline 8/31: 48.13% 7/27:  48.1% 6/22: 46.99% 5/18: 48/100 10/5: 49/100 11/2: 48%  12/30: 46.9% 1/25: 46.99%; 07/27/2020 FOTO 42% 4/21: 47%    Time 12    Period Weeks    Status Partially Met    Target Date 11/18/20      PT LONG TERM GOAL #2   Title Patient (< 66 years old) will complete five times sit to stand test  in < 20 seconds indicating an increased LE strength and improved balance.    Baseline 7/27: 26.8 seconds with one partial stand 6/22: 40. 86 sec; unable to reach full stand last two attempts 5/19: 42 seconds 10/5: BUE reliance, patient unable to complete 4 full stands. Attempt next session 11/2: 23.71 seconds 12/30: 30.32 with one near LOB due to spasms 1/25: 27.11 seconds with one hand on RW one hand on chair; 37.5 seconds with use of BUEs 4/21: 28.2 seconds    Time 12    Period Weeks    Status Partially Met    Target Date 11/18/20      PT LONG TERM GOAL #3   Title Patient will increase 10 meter walk test to <20 seconds as to improve gait speed for better community ambulation and to reduce fall risk.    Baseline 8/31: 35.1 seconds with RW 7/27: 36 seconds with RW 6/22: 36.1 seconds 5/19: 42 seconds 10/5: 31.9 seconds with RW 11/2: 29.9 seconds with RW 12/30: deferred due to spasm 1/25: defer to next session 1/27: 29 seconds with RW; 07/27/2020 deferred; 07/29/2020 41.5 sec (0.24 m/s) 4/21: 32.3 seconds with RW    Time 12    Period Weeks    Status Partially Met    Target Date 11/18/20      PT LONG TERM GOAL #4   Title Patient will reduce timed up and go to <11 seconds to reduce fall risk and demonstrate improved transfer/gait ability.    Baseline 8/31: 50.8 seconds with RW 7/27: 43.65 6/22: 45.4 seconds 5/19: 47 seconds 10/5: 38.5 seconds with RW 11/2: 35.72 seconds with RW 12/30: 43.5 seconds with wheels wet. 1/25: deferred 1/27: 38.9 seconds; 07/27/2020 82 seconds with RW, CGA; 07/29/2020 44 seconds 4/21: 38 seconds with RW    Time 12    Period Weeks    Status Partially Met    Target Date 11/18/20      PT LONG TERM GOAL #5   Title Patient will ambulate from  physical therapy gym to elevator down the hall, ride elevator to first floor, and walk to valet with RW and chair follow for increased capacity for mobility and ind.    Baseline 8/31: able to open door ~50 ft with one rest break 8/10:  unable to ambulate to elevator without rest break 9/30: able to ambulate to elevator with 3 rest breaks 1/25: 181 ft with 2 seated rest break (to elevator); 07/27/2020 deferred 4/21 able to perform with one seated rest break to elevator unable to get into elevator yet    Time 12    Period Weeks    Status Partially Met    Target Date 11/18/20      PT LONG TERM GOAL #6   Title Patient will be able to stand with RW and lift one leg onto 4" step for carryover to negotiation of steps at home.    Baseline 11/2: unable to perform 1/25: R foot 0.75 inch lift; 07/27/2020 heel not fully off ground, but pt slightly able to lift R LE to tap side of step while holding onto RW with both hands 4/21: able to clear foot ~3/4 inch with RLE    Time 12    Period Weeks    Status Partially Met    Target Date 11/18/20                 Plan - 09/07/20 1230    Clinical Impression Statement Patient's goals performed on 01/04/55 for recert. Please refer to this note for details on progression towards goals at this time. Patient's condition has the potential to improve in response to therapy. Maximum improvement is yet to be obtained. The anticipated improvement is attainable and reasonable in a generally predictable time.The patient would benefit from continued skilled PT intervention to maximize QOL, safety, and function    Personal Factors and Comorbidities Age;Comorbidity 3+;Fitness;Past/Current Experience;Sex;Time since onset of injury/illness/exacerbation    Comorbidities HTN, SVT, neurogenic bowel, MS (1995), neuropathy, hx of wrist fx, HPV, Hypercholesteremia, adiposity, osteopenia, spasticity.    Examination-Activity Limitations Bathing;Bed Mobility;Bend;Carry;Continence;Dressing;Hygiene/Grooming;Stairs;Squat;Reach Overhead;Locomotion Level;Lift;Stand;Transfers    Examination-Participation Restrictions Church;Cleaning;Community Activity;Laundry;Volunteer;Shop;Meal Prep;Yard Work    Multimedia programmer Evolving/Moderate complexity    Rehab Potential Good    PT Frequency 2x / week    PT Duration 12 weeks    PT Treatment/Interventions ADLs/Self Care Home Management;Aquatic Therapy;Biofeedback;Cryotherapy;Electrical Stimulation;Iontophoresis 60m/ml Dexamethasone;Traction;Moist Heat;Ultrasound;DME Instruction;Gait training;Stair training;Functional mobility training;Therapeutic activities;Therapeutic exercise;Balance training;Neuromuscular re-education;Patient/family education;Orthotic Fit/Training;Wheelchair mobility training;Manual techniques;Compression bandaging;Passive range of motion;Dry needling;Energy conservation;Taping;Splinting;Vestibular    PT Next Visit Plan step negotiation, turning directions to the L and R; re-test goals    PT Home Exercise Plan maintain current HEP from [facility] Agree with Plan of Care Patient           Patient will benefit from skilled therapeutic intervention in order to improve the following deficits and impairments:  Abnormal gait,Decreased activity tolerance,Decreased balance,Decreased coordination,Decreased endurance,Decreased mobility,Decreased range of motion,Decreased strength,Difficulty walking,Impaired perceived functional ability,Impaired sensation,Impaired tone,Impaired UE functional use,Improper body mechanics,Postural dysfunction,Cardiopulmonary status limiting activity,Impaired flexibility,Increased muscle spasms  Visit Diagnosis: Muscle weakness (generalized)  Other abnormalities of gait and mobility  Unsteadiness on feet  Multiple sclerosis exacerbation [place]     Problem List Patient Active Problem List   Diagnosis Date Noted  . Hypoalbuminemia due to protein-calorie malnutrition ([place]   . Neurogenic bowel   . Neurogenic bladder   . Labile blood pressure   . Neuropathic pain   . Abscess of female pelvis   .  SVT (supraventricular tachycardia) ([place])   . Radial styloid tenosynovitis 03/12/2018  . Wheelchair  confinement 02/27/2018  . Localized osteoporosis with current pathological fracture with routine healing 01/19/2017  . Wrist fracture 01/16/2017  . Sprain of ankle 03/23/2016  . Closed fracture of lateral malleolus 03/16/2016  . Health care maintenance 01/24/2016  . Blood pressure elevated without history of HTN 10/25/2015  . Essential hypertension 10/25/2015  . Multiple sclerosis ([place]) 10/02/2015  . Chronic left shoulder pain 07/19/2015  . Multiple sclerosis exacerbation ([place]) 07/14/2015  . MS (multiple sclerosis) ([place]) 11/26/2014  . Increased body mass index 11/26/2014  . HPV test positive 11/26/2014  . Status post laparoscopic supracervical hysterectomy 11/26/2014  . Galactorrhea 11/26/2014  . Back ache 05/21/2014  . Adiposity 05/21/2014  . Disordered sleep 05/21/2014  . Muscle spasticity 05/21/2014  . Spasticity 05/21/2014  . Calculus of kidney 12/09/2013  . Renal colic 37/00/5259  . Hypercholesteremia 08/19/2013  . Hereditary and idiopathic neuropathy 08/19/2013  . Hypercholesterolemia without hypertriglyceridemia 08/19/2013  . Bladder infection, chronic 07/25/2012  . Disorder of bladder function 07/25/2012  . Incomplete bladder emptying 07/25/2012  . Microscopic hematuria 07/25/2012  . Right upper quadrant pain 07/25/2012   Janna Arch, PT, DPT   09/07/2020, 12:32 PM  [place] [facility] SERVICES [address] [place], [place], [address] Phone: [phone]   Fax:  [phone]  Name: Maribeth Jiles Beazley MRN: [id number] Date of Birth: 14-Aug-1960

## 2020-09-09 ENCOUNTER — Other Ambulatory Visit: Payer: Self-pay

## 2020-09-09 ENCOUNTER — Ambulatory Visit: Payer: PPO

## 2020-09-09 DIAGNOSIS — R2689 Other abnormalities of gait and mobility: Secondary | ICD-10-CM

## 2020-09-09 DIAGNOSIS — M6281 Muscle weakness (generalized): Secondary | ICD-10-CM

## 2020-09-09 DIAGNOSIS — R2681 Unsteadiness on feet: Secondary | ICD-10-CM

## 2020-09-09 NOTE — Therapy (Signed)
[place] MAIN [facility] SERVICES [place], [place], [address] Phone: [phone]   Fax:  [phone]  Physical Therapy Treatment  Patient Details  Name: Lisa Williams MRN: [id number] Date of Birth: 06-21-1960 Referring Provider (PT): Gurney Maxin, MD   Encounter Date: 09/09/2020   PT End of Session - 09/09/20 1120    Visit Number 91    Number of Visits 100    Date for PT Re-Evaluation 11/18/20    Authorization Type 1/10 PN 5/3    PT Start Time 1015    PT Stop Time 1058    PT Time Calculation (min) 43 min    Equipment Utilized During Treatment Gait belt;Other (comment)    Activity Tolerance Patient tolerated treatment well;Patient limited by fatigue    Behavior During Therapy [facility] for tasks assessed/performed           Past Medical History:  Diagnosis Date  . Abdominal pain, right upper quadrant   . Back pain   . Calculus of kidney 12/09/2013  . Chronic back pain    unspecified  . Chronic left shoulder pain 07/19/2015  . Complication of anesthesia   . Functional disorder of bladder    other  . Galactorrhea 11/26/2014   Chronic   . Hereditary and idiopathic neuropathy 08/19/2013  . HPV test positive   . Hypercholesteremia 08/19/2013  . Hypertension   . Incomplete bladder emptying   . Microscopic hematuria   . MS (multiple sclerosis) ([place])   . Muscle spasticity 05/21/2014  . Nonspecific findings on examination of urine    other  . Osteopenia   . PONV (postoperative nausea and vomiting)   . Status post laparoscopic supracervical hysterectomy 11/26/2014  . Tobacco user 11/26/2014  . Wrist fracture     Past Surgical History:  Procedure Laterality Date  . bilateral tubal ligation  1996  . BREAST CYST EXCISION Left 2002  . FRACTURE SURGERY    . KNEE SURGERY     right  . LAPAROSCOPIC SUPRACERVICAL HYSTERECTOMY  08/05/2013  . ORIF WRIST FRACTURE Left 01/17/2017   Procedure: OPEN REDUCTION INTERNAL FIXATION  (ORIF) WRIST FRACTURE;  Surgeon: Lovell Sheehan, MD;  Location: [facility];  Service: Orthopedics;  Laterality: Left;  . RADIOLOGY WITH ANESTHESIA N/A 03/18/2020   Procedure: MRI WITH ANESTHESIA CERVICAL SPINE AND BRAIN  WITH AND WITHOUT CONTRAST;  Surgeon: Radiologist, Medication, MD;  Location: [facility];  Service: Radiology;  Laterality: N/A;  . TUBAL LIGATION Bilateral   . VAGINAL HYSTERECTOMY  03/2006    There were no vitals filed for this visit.   Subjective Assessment - 09/09/20 1119    Subjective Patient reports it is hot outside but is in pleasant spirits. No falls or LOB since last session.    Pertinent History Patient is previous patient of this therapist, was admitted to the hospital on 08/03/19 for MS exacerbation and then went to inpatient rehab on 08/08/19. Patient then received home health therapy and now is returning to outpatient PT. PMh includes HTN, SVT, neurogenic bowel, MS (1995), neuropathy, hx of wrist fx, HPV, Hypercholesteremia, adiposity, osteopenia, spasticity. She wears bilateral AFOs and uses a RW and/or wheelchair for mobility. She drives an adapted car.    Limitations Lifting;Standing;Walking;House hold activities    How long can you sit comfortably? n/a    How long can you stand comfortably? 4 minutes    How long can you walk comfortably? walked 50 ft in inpatient rehab    Patient  Stated Goals return to PLOF, increase walking, get into shower, increase mobility    Currently in Pain? No/denies                   Gait: Patient ambulates 75f, 2 seated rest breaks CGA and wheelchair follow for safety. Ambulate from PT waiting room towards elevator however had to stop due to heat exacerbation.  Patient able to navigate opening doors, turns, and thresholds independently but with inc time to perform. Patient demonstrates inc reliance on BUE by end of gait trial. This is patients first attempt and success at elevator in standing position.     Seated:  5lb bar: Chest  press 10x; cues for core activation and alignment of elbows Row 10x cue for scapular retraction; good body mechanics Clockwise row 10x, counterclockwise 10x Vertical row 10x; very challenging Lateral side bends 10x each side Green ball between knees adduction 10x 3 second holds RTB around knees: abduction 10x very challenging for patient to perform.    Ice pack donned throughout session to minimize exacerbation of MS symptoms in order to perform interventions.    Pt educated throughout session about proper posture and technique with exercises. Improved exercise technique, movement at target joints, use of target muscles after min to mod verbal, visual, tactile cues   Patient is intermittently heating up throughout session requiring constant monitoring and rest breaks. Patient remains highly motivated.despite fatigue. Weighted bar strengthening interventions tolerated well. The patient would benefit from continued skilled PT intervention to maximize QOL, safety, and function                       PT Education - 09/09/20 1119    Education provided Yes    Education Details exercise technique, body mechanics    Person(s) Educated Patient    Methods Explanation;Demonstration;Tactile cues;Verbal cues    Comprehension Verbalized understanding;Returned demonstration;Verbal cues required;Tactile cues required            PT Short Term Goals - 08/26/20 1115      PT SHORT TERM GOAL #1   Title Patient will be independent in home exercise program to improve strength/mobility for better functional independence with ADLs.    Baseline 5/19: HEP next session 6/22: HEP compliant 8/10 : HEP progressions compliant 8/31 HEP compliant 10/5: HEP compliant    Time 4    Period Weeks    Status Achieved    Target Date 01/13/20      PT SHORT TERM GOAL #2   Title Patient will perform a sit to stand with SUE support for increased ind. with transfers.    Baseline 5/19: heavy BUE support  6/22: requires one hand on walker one hand on w/c 8/10: able to perform intermittently (some days can some days cannot) 8/31: attempt next session 10/5: BUE reliance, patient unable to complete 4 full stands. Attempt next session 1/25: able to perform from elevated surface only; 3/22 pt pushes through L arm off of wheelchair with R arm on RW for tactile cue/balance to perform transfer 4/21: requires use of UE's    Time 4    Period Weeks    Status On-going    Target Date 09/23/20             PT Long Term Goals - 08/26/20 0001      PT LONG TERM GOAL #1   Title Patient will increase FOTO score to equal to or greater than  53/100   to demonstrate statistically  significant improvement in mobility and quality of life.    Baseline 8/31: 48.13% 7/27:  48.1% 6/22: 46.99% 5/18: 48/100 10/5: 49/100 11/2: 48%  12/30: 46.9% 1/25: 46.99%; 07/27/2020 FOTO 42% 4/21: 47%    Time 12    Period Weeks    Status Partially Met    Target Date 11/18/20      PT LONG TERM GOAL #2   Title Patient (< 37 years old) will complete five times sit to stand test in < 20 seconds indicating an increased LE strength and improved balance.    Baseline 7/27: 26.8 seconds with one partial stand 6/22: 40. 86 sec; unable to reach full stand last two attempts 5/19: 42 seconds 10/5: BUE reliance, patient unable to complete 4 full stands. Attempt next session 11/2: 23.71 seconds 12/30: 30.32 with one near LOB due to spasms 1/25: 27.11 seconds with one hand on RW one hand on chair; 37.5 seconds with use of BUEs 4/21: 28.2 seconds    Time 12    Period Weeks    Status Partially Met    Target Date 11/18/20      PT LONG TERM GOAL #3   Title Patient will increase 10 meter walk test to <20 seconds as to improve gait speed for better community ambulation and to reduce fall risk.    Baseline 8/31: 35.1 seconds with RW 7/27: 36 seconds with RW 6/22: 36.1 seconds 5/19: 42 seconds 10/5: 31.9 seconds with RW 11/2: 29.9 seconds with RW 12/30:  deferred due to spasm 1/25: defer to next session 1/27: 29 seconds with RW; 07/27/2020 deferred; 07/29/2020 41.5 sec (0.24 m/s) 4/21: 32.3 seconds with RW    Time 12    Period Weeks    Status Partially Met    Target Date 11/18/20      PT LONG TERM GOAL #4   Title Patient will reduce timed up and go to <11 seconds to reduce fall risk and demonstrate improved transfer/gait ability.    Baseline 8/31: 50.8 seconds with RW 7/27: 43.65 6/22: 45.4 seconds 5/19: 47 seconds 10/5: 38.5 seconds with RW 11/2: 35.72 seconds with RW 12/30: 43.5 seconds with wheels wet. 1/25: deferred 1/27: 38.9 seconds; 07/27/2020 82 seconds with RW, CGA; 07/29/2020 44 seconds 4/21: 38 seconds with RW    Time 12    Period Weeks    Status Partially Met    Target Date 11/18/20      PT LONG TERM GOAL #5   Title Patient will ambulate from physical therapy gym to elevator down the hall, ride elevator to first floor, and walk to valet with RW and chair follow for increased capacity for mobility and ind.    Baseline 8/31: able to open door ~50 ft with one rest break 8/10: unable to ambulate to elevator without rest break 9/30: able to ambulate to elevator with 3 rest breaks 1/25: 181 ft with 2 seated rest break (to elevator); 07/27/2020 deferred 4/21 able to perform with one seated rest break to elevator unable to get into elevator yet    Time 12    Period Weeks    Status Partially Met    Target Date 11/18/20      PT LONG TERM GOAL #6   Title Patient will be able to stand with RW and lift one leg onto 4" step for carryover to negotiation of steps at home.    Baseline 11/2: unable to perform 1/25: R foot 0.75 inch lift; 07/27/2020 heel not fully off ground, but  pt slightly able to lift R LE to tap side of step while holding onto RW with both hands 4/21: able to clear foot ~3/4 inch with RLE    Time 12    Period Weeks    Status Partially Met    Target Date 11/18/20                 Plan - 09/09/20 1219    Clinical  Impression Statement Patient is intermittently heating up throughout session requiring constant monitoring and rest breaks. Patient remains highly motivated.despite fatigue. Weighted bar strengthening interventions tolerated well. The patient would benefit from continued skilled PT intervention to maximize QOL, safety, and function    Personal Factors and Comorbidities Age;Comorbidity 3+;Fitness;Past/Current Experience;Sex;Time since onset of injury/illness/exacerbation    Comorbidities HTN, SVT, neurogenic bowel, MS (1995), neuropathy, hx of wrist fx, HPV, Hypercholesteremia, adiposity, osteopenia, spasticity.    Examination-Activity Limitations Bathing;Bed Mobility;Bend;Carry;Continence;Dressing;Hygiene/Grooming;Stairs;Squat;Reach Overhead;Locomotion Level;Lift;Stand;Transfers    Examination-Participation Restrictions Church;Cleaning;Community Activity;Laundry;Volunteer;Shop;Meal Prep;Yard Work    [employer] Evolving/Moderate complexity    Rehab Potential Good    PT Frequency 2x / week    PT Duration 12 weeks    PT Treatment/Interventions ADLs/Self Care Home Management;Aquatic Therapy;Biofeedback;Cryotherapy;Electrical Stimulation;Iontophoresis 55m/ml Dexamethasone;Traction;Moist Heat;Ultrasound;DME Instruction;Gait training;Stair training;Functional mobility training;Therapeutic activities;Therapeutic exercise;Balance training;Neuromuscular re-education;Patient/family education;Orthotic Fit/Training;Wheelchair mobility training;Manual techniques;Compression bandaging;Passive range of motion;Dry needling;Energy conservation;Taping;Splinting;Vestibular    PT Next Visit Plan step negotiation, turning directions to the L and R; re-test goals    PT Home Exercise Plan maintain [place] Agree with Plan of Care Patient           Patient will benefit from skilled therapeutic intervention in order to improve the following deficits and impairments:   Abnormal gait,Decreased activity tolerance,Decreased balance,Decreased coordination,Decreased endurance,Decreased mobility,Decreased range of motion,Decreased strength,Difficulty walking,Impaired perceived functional ability,Impaired sensation,Impaired tone,Impaired UE functional use,Improper body mechanics,Postural dysfunction,Cardiopulmonary status limiting activity,Impaired flexibility,Increased muscle spasms  Visit Diagnosis: Muscle weakness (generalized)  Other abnormalities of gait and mobility  Unsteadiness on feet     Problem List Patient Active Problem List   Diagnosis Date Noted  . Hypoalbuminemia due to protein-calorie malnutrition ([place]   . Neurogenic bowel   . Neurogenic bladder   . Labile blood pressure   . Neuropathic pain   . Abscess of female pelvis   . SVT (supra[place]ricular tachycardia) ([place]   . Radial styloid tenosynovitis 03/12/2018  . Wheelchair confinement 02/27/2018  . Localized osteoporosis with current pathological fracture with routine healing 01/19/2017  . Wrist fracture 01/16/2017  . Sprain of ankle 03/23/2016  . Closed fracture of lateral malleolus 03/16/2016  . Health care maintenance 01/24/2016  . Blood pressure elevated without history of HTN 10/25/2015  . Essential hypertension 10/25/2015  . Multiple sclerosis ([place] 10/02/2015  . Chronic left shoulder pain 07/19/2015  . Multiple sclerosis exacerbation ([place] 07/14/2015  . MS (multiple sclerosis) ([place] 11/26/2014  . Increased body mass index 11/26/2014  . HPV test positive 11/26/2014  . Status post laparoscopic supracervical hysterectomy 11/26/2014  . Galactorrhea 11/26/2014  . Back ache 05/21/2014  . Adiposity 05/21/2014  . Disordered sleep 05/21/2014  . Muscle spasticity 05/21/2014  . Spasticity 05/21/2014  . Calculus of kidney 12/09/2013  . Renal colic 030/13/1438 . Hypercholesteremia 08/19/2013  . Hereditary and idiopathic neuropathy 08/19/2013  . Hypercholesterolemia without  hypertriglyceridemia 08/19/2013  . Bladder infection, chronic 07/25/2012  . Disorder of bladder function 07/25/2012  . Incomplete bladder emptying 07/25/2012  . Microscopic hematuria 07/25/2012  . Right  upper quadrant pain 07/25/2012   Janna Arch, PT, DPT   09/09/2020, 12:25 PM  [place] MAIN [facility] SERVICES [address] [place], [place], [address] Phone: [phone]   Fax:  [phone]  Name: Lisa Williams MRN: [id number] Date of Birth: 1961-04-08

## 2020-09-13 DIAGNOSIS — H5203 Hypermetropia, bilateral: Secondary | ICD-10-CM | POA: Diagnosis not present

## 2020-09-14 ENCOUNTER — Other Ambulatory Visit: Payer: Self-pay

## 2020-09-14 ENCOUNTER — Ambulatory Visit: Payer: PPO

## 2020-09-14 DIAGNOSIS — M6281 Muscle weakness (generalized): Secondary | ICD-10-CM

## 2020-09-14 DIAGNOSIS — R2681 Unsteadiness on feet: Secondary | ICD-10-CM

## 2020-09-14 DIAGNOSIS — R2689 Other abnormalities of gait and mobility: Secondary | ICD-10-CM

## 2020-09-14 NOTE — Therapy (Signed)
[place] MAIN [facility] SERVICES [place], [place], [address] Phone: [phone]   Fax:  [phone]  Physical Therapy Treatment  Patient Details  Name: Lisa Williams MRN: [id number] Date of Birth: 07-03-1960 Referring Provider (PT): Gurney Maxin, MD   Encounter Date: 09/14/2020   PT End of Session - 09/14/20 1240    Visit Number [id number]    Number of Visits 100    Date for PT Re-Evaluation 11/18/20    Authorization Type 2/10 PN 5/3    PT Start Time 1015    PT Stop Time 1059    PT Time Calculation (min) 44 min    Equipment Utilized During Treatment Gait belt;Other (comment)    Activity Tolerance Patient tolerated treatment well;Patient limited by fatigue    Behavior During Therapy [facility] for tasks assessed/performed           Past Medical History:  Diagnosis Date  . Abdominal pain, right upper quadrant   . Back pain   . Calculus of kidney 12/09/2013  . Chronic back pain    unspecified  . Chronic left shoulder pain 07/19/2015  . Complication of anesthesia   . Functional disorder of bladder    other  . Galactorrhea 11/26/2014   Chronic   . Hereditary and idiopathic neuropathy 08/19/2013  . HPV test positive   . Hypercholesteremia 08/19/2013  . Hypertension   . Incomplete bladder emptying   . Microscopic hematuria   . MS (multiple sclerosis) ([place])   . Muscle spasticity 05/21/2014  . Nonspecific findings on examination of urine    other  . Osteopenia   . PONV (postoperative nausea and vomiting)   . Status post laparoscopic supracervical hysterectomy 11/26/2014  . Tobacco user 11/26/2014  . Wrist fracture     Past Surgical History:  Procedure Laterality Date  . bilateral tubal ligation  1996  . BREAST CYST EXCISION Left 2002  . FRACTURE SURGERY    . KNEE SURGERY     right  . LAPAROSCOPIC SUPRACERVICAL HYSTERECTOMY  08/05/2013  . ORIF WRIST FRACTURE Left 01/17/2017   Procedure: OPEN REDUCTION INTERNAL FIXATION  (ORIF) WRIST FRACTURE;  Surgeon: Lovell Sheehan, MD;  Location: [facility];  Service: Orthopedics;  Laterality: Left;  . RADIOLOGY WITH ANESTHESIA N/A 03/18/2020   Procedure: MRI WITH ANESTHESIA CERVICAL SPINE AND BRAIN  WITH AND WITHOUT CONTRAST;  Surgeon: Radiologist, Medication, MD;  Location: [place];  Service: Radiology;  Laterality: N/A;  . TUBAL LIGATION Bilateral   . VAGINAL HYSTERECTOMY  03/2006    There were no vitals filed for this visit.   Subjective Assessment - 09/14/20 1239    Subjective Patient reports she had a busy weekend but is doing well. No falls or LOB since last session.    Pertinent History Patient is previous patient of this therapist, was admitted to the hospital on 08/03/19 for MS exacerbation and then went to inpatient rehab on 08/08/19. Patient then received home health therapy and now is returning to outpatient PT. PMh includes HTN, SVT, neurogenic bowel, MS (1995), neuropathy, hx of wrist fx, HPV, Hypercholesteremia, adiposity, osteopenia, spasticity. She wears bilateral AFOs and uses a RW and/or wheelchair for mobility. She drives an adapted car.    Limitations Lifting;Standing;Walking;House hold activities    How long can you sit comfortably? n/a    How long can you stand comfortably? 4 minutes    How long can you walk comfortably? walked 50 ft in inpatient rehab    Patient  Stated Goals return to PLOF, increase walking, get into shower, increase mobility    Currently in Pain? No/denies               Standing in // bars:  -Standing weight shifts 2x10 each side; UE support focus on hip shift rather than pull with arm Reach down and pick up 4 cones from 6" step on short side and reach cross body to hand to PT x2 trials each UE. One seated rest break,  -modified tandem stance 20 second holds very challenging with LLE posterior placement -modified squat with chair behind 10x        Seated:  Weighted ball: (3000 gr)  -chest press 10x  With cues for upright  posture -straight arm raise 10x -woodchop d1 and d2 patterning 10x each with rest break between -half arc 10x each direction    Hamstring isometric 10x each LE  Hamstring stretch on PT leg 2x30 seconds; x 2 sets  RTB abduction 15x Adduction ball squeeze 15x Single arm raise with opp UE in straight arm abduction 10x each UE      Ice pack donned throughout session to minimize exacerbation of MS symptoms in order to perform interventions.      Pt educated throughout session about proper posture and technique with exercises. Improved exercise technique, movement at target joints, use of target muscles after min to mod verbal, visual, tactile cues      Patient tolerated increased standing interventions as well as intensity of standing interventions. She is highly motivated throughout physical therapy session. She does require frequent seated rest breaks throughout physical therapy session due to fatigue and overheating. The patient would benefit from continued skilled PT intervention to maximize QOL, safety, and function                     PT Education - 09/14/20 1240    Education provided Yes    Education Details exercise technique, body mechanics    Person(s) Educated Patient    Methods Explanation;Demonstration;Tactile cues;Verbal cues    Comprehension Verbalized understanding;Returned demonstration;Verbal cues required;Tactile cues required            PT Short Term Goals - 08/26/20 1115      PT SHORT TERM GOAL #1   Title Patient will be independent in home exercise program to improve strength/mobility for better functional independence with ADLs.    Baseline 5/19: HEP next session 6/22: HEP compliant 8/10 : HEP progressions compliant 8/31 HEP compliant 10/5: HEP compliant    Time 4    Period Weeks    Status Achieved    Target Date 01/13/20      PT SHORT TERM GOAL #2   Title Patient will perform a sit to stand with SUE support for increased ind. with  transfers.    Baseline 5/19: heavy BUE support 6/22: requires one hand on walker one hand on w/c 8/10: able to perform intermittently (some days can some days cannot) 8/31: attempt next session 10/5: BUE reliance, patient unable to complete 4 full stands. Attempt next session 1/25: able to perform from elevated surface only; 3/22 pt pushes through L arm off of wheelchair with R arm on RW for tactile cue/balance to perform transfer 4/21: requires use of UE's    Time 4    Period Weeks    Status On-going    Target Date 09/23/20             PT Long Term Goals -  08/26/20 0001      PT LONG TERM GOAL #1   Title Patient will increase FOTO score to equal to or greater than  53/100   to demonstrate statistically significant improvement in mobility and quality of life.    Baseline 8/31: 48.13% 7/27:  48.1% 6/22: 46.99% 5/18: 48/100 10/5: 49/100 11/2: 48%  12/30: 46.9% 1/25: 46.99%; 07/27/2020 FOTO 42% 4/21: 47%    Time 12    Period Weeks    Status Partially Met    Target Date 11/18/20      PT LONG TERM GOAL #2   Title Patient (< 26 years old) will complete five times sit to stand test in < 20 seconds indicating an increased LE strength and improved balance.    Baseline 7/27: 26.8 seconds with one partial stand 6/22: 40. 86 sec; unable to reach full stand last two attempts 5/19: 42 seconds 10/5: BUE reliance, patient unable to complete 4 full stands. Attempt next session 11/2: 23.71 seconds 12/30: 30.32 with one near LOB due to spasms 1/25: 27.11 seconds with one hand on RW one hand on chair; 37.5 seconds with use of BUEs 4/21: 28.2 seconds    Time 12    Period Weeks    Status Partially Met    Target Date 11/18/20      PT LONG TERM GOAL #3   Title Patient will increase 10 meter walk test to <20 seconds as to improve gait speed for better community ambulation and to reduce fall risk.    Baseline 8/31: 35.1 seconds with RW 7/27: 36 seconds with RW 6/22: 36.1 seconds 5/19: 42 seconds 10/5: 31.9  seconds with RW 11/2: 29.9 seconds with RW 12/30: deferred due to spasm 1/25: defer to next session 1/27: 29 seconds with RW; 07/27/2020 deferred; 07/29/2020 41.5 sec (0.24 m/s) 4/21: 32.3 seconds with RW    Time 12    Period Weeks    Status Partially Met    Target Date 11/18/20      PT LONG TERM GOAL #4   Title Patient will reduce timed up and go to <11 seconds to reduce fall risk and demonstrate improved transfer/gait ability.    Baseline 8/31: 50.8 seconds with RW 7/27: 43.65 6/22: 45.4 seconds 5/19: 47 seconds 10/5: 38.5 seconds with RW 11/2: 35.72 seconds with RW 12/30: 43.5 seconds with wheels wet. 1/25: deferred 1/27: 38.9 seconds; 07/27/2020 82 seconds with RW, CGA; 07/29/2020 44 seconds 4/21: 38 seconds with RW    Time 12    Period Weeks    Status Partially Met    Target Date 11/18/20      PT LONG TERM GOAL #5   Title Patient will ambulate from physical therapy gym to elevator down the hall, ride elevator to first floor, and walk to valet with RW and chair follow for increased capacity for mobility and ind.    Baseline 8/31: able to open door ~50 ft with one rest break 8/10: unable to ambulate to elevator without rest break 9/30: able to ambulate to elevator with 3 rest breaks 1/25: 181 ft with 2 seated rest break (to elevator); 07/27/2020 deferred 4/21 able to perform with one seated rest break to elevator unable to get into elevator yet    Time 12    Period Weeks    Status Partially Met    Target Date 11/18/20      PT LONG TERM GOAL #6   Title Patient will be able to stand with RW and lift one  leg onto 4" step for carryover to negotiation of steps at home.    Baseline 11/2: unable to perform 1/25: R foot 0.75 inch lift; 07/27/2020 heel not fully off ground, but pt slightly able to lift R LE to tap side of step while holding onto RW with both hands 4/21: able to clear foot ~3/4 inch with RLE    Time 12    Period Weeks    Status Partially Met    Target Date 11/18/20                  Plan - 09/14/20 1242    Clinical Impression Statement Patient tolerated increased standing interventions as well as intensity of standing interventions. She is highly motivated throughout physical therapy session. She does require frequent seated rest breaks throughout physical therapy session due to fatigue and overheating. The patient would benefit from continued skilled PT intervention to maximize QOL, safety, and function    Personal Factors and Comorbidities Age;Comorbidity 3+;Fitness;Past/Current Experience;Sex;Time since onset of injury/illness/exacerbation    Comorbidities HTN, SVT, neurogenic bowel, MS (1995), neuropathy, hx of wrist fx, HPV, Hypercholesteremia, adiposity, osteopenia, spasticity.    Examination-Activity Limitations Bathing;Bed Mobility;Bend;Carry;Continence;Dressing;Hygiene/Grooming;Stairs;Squat;Reach Overhead;Locomotion Level;Lift;Stand;Transfers    Examination-Participation Restrictions Church;Cleaning;Community Activity;Laundry;Volunteer;Shop;Meal Prep;Yard Work    [employer] Evolving/Moderate complexity    Rehab Potential Good    PT Frequency 2x / week    PT Duration 12 weeks    PT Treatment/Interventions ADLs/Self Care Home Management;Aquatic Therapy;Biofeedback;Cryotherapy;Electrical Stimulation;Iontophoresis 67m/ml Dexamethasone;Traction;Moist Heat;Ultrasound;DME Instruction;Gait training;Stair training;Functional mobility training;Therapeutic activities;Therapeutic exercise;Balance training;Neuromuscular re-education;Patient/family education;Orthotic Fit/Training;Wheelchair mobility training;Manual techniques;Compression bandaging;Passive range of motion;Dry needling;Energy conservation;Taping;Splinting;Vestibular    PT Next Visit Plan step negotiation, turning directions to the L and R; re-test goals    PT Home Exercise Plan maintain current HEP from [place] Agree with Plan of Care Patient           Patient  will benefit from skilled therapeutic intervention in order to improve the following deficits and impairments:  Abnormal gait,Decreased activity tolerance,Decreased balance,Decreased coordination,Decreased endurance,Decreased mobility,Decreased range of motion,Decreased strength,Difficulty walking,Impaired perceived functional ability,Impaired sensation,Impaired tone,Impaired UE functional use,Improper body mechanics,Postural dysfunction,Cardiopulmonary status limiting activity,Impaired flexibility,Increased muscle spasms  Visit Diagnosis: Muscle weakness (generalized)  Other abnormalities of gait and mobility  Unsteadiness on feet     Problem List Patient Active Problem List   Diagnosis Date Noted  . Hypoalbuminemia due to protein-calorie malnutrition ([place]   . Neurogenic bowel   . Neurogenic bladder   . Labile blood pressure   . Neuropathic pain   . Abscess of female pelvis   . SVT (supraventricular tachycardia) ([place]   . Radial styloid tenosynovitis 03/12/2018  . Wheelchair confinement 02/27/2018  . Localized osteoporosis with current pathological fracture with routine healing 01/19/2017  . Wrist fracture 01/16/2017  . Sprain of ankle 03/23/2016  . Closed fracture of lateral malleolus 03/16/2016  . Health care maintenance 01/24/2016  . Blood pressure elevated without history of HTN 10/25/2015  . Essential hypertension 10/25/2015  . Multiple sclerosis ([place] 10/02/2015  . Chronic left shoulder pain 07/19/2015  . Multiple sclerosis exacerbation ([place] 07/14/2015  . MS (multiple sclerosis) ([place] 11/26/2014  . Increased body mass index 11/26/2014  . HPV test positive 11/26/2014  . Status post laparoscopic supracervical hysterectomy 11/26/2014  . Galactorrhea 11/26/2014  . Back ache 05/21/2014  . Adiposity 05/21/2014  . Disordered sleep 05/21/2014  . Muscle spasticity 05/21/2014  . Spasticity 05/21/2014  . Calculus of kidney 12/09/2013  . Renal colic 024/40/1027 .  Hypercholesteremia 08/19/2013  . Hereditary and idiopathic neuropathy 08/19/2013  . Hypercholesterolemia without hypertriglyceridemia 08/19/2013  . Bladder infection, chronic 07/25/2012  . Disorder of bladder function 07/25/2012  . Incomplete bladder emptying 07/25/2012  . Microscopic hematuria 07/25/2012  . Right upper quadrant pain 07/25/2012   Janna Arch, PT, DPT   09/14/2020, 12:45 PM  [place] MAIN [facility] SERVICES [address] [place], [place], [address] Phone: [phone]   Fax:  [phone]  Name: Lisa Williams MRN: [id number] Date of Birth: 07-Aug-1960

## 2020-09-16 ENCOUNTER — Other Ambulatory Visit: Payer: Self-pay

## 2020-09-16 ENCOUNTER — Ambulatory Visit: Payer: PPO

## 2020-09-16 DIAGNOSIS — M6281 Muscle weakness (generalized): Secondary | ICD-10-CM | POA: Diagnosis not present

## 2020-09-16 DIAGNOSIS — R2689 Other abnormalities of gait and mobility: Secondary | ICD-10-CM

## 2020-09-16 DIAGNOSIS — R2681 Unsteadiness on feet: Secondary | ICD-10-CM

## 2020-09-16 NOTE — Therapy (Signed)
[place] MAIN [facility] SERVICES [address] [place], [place], [address] Phone: [phone]   Fax:  [phone]  Physical Therapy Treatment  Patient Details  Name: Lisa Williams MRN: [id number] Date of Birth: [birth year] Referring Provider (PT): Gurney Maxin, MD   Encounter Date: 09/16/2020   PT End of Session - 09/16/20 1008    Visit Number [id number]    Number of Visits 100    Date for PT Re-Evaluation 11/18/20    Authorization Type 3/10 PN 5/3    PT Start Time 1014    PT Stop Time 1058    PT Time Calculation (min) 44 min    Equipment Utilized During Treatment Gait belt;Other (comment)    Activity Tolerance Patient tolerated treatment well;Patient limited by fatigue    Behavior During Therapy [facility] for tasks assessed/performed           Past Medical History:  Diagnosis Date  . Abdominal pain, right upper quadrant   . Back pain   . Calculus of kidney 12/09/2013  . Chronic back pain    unspecified  . Chronic left shoulder pain 07/19/2015  . Complication of anesthesia   . Functional disorder of bladder    other  . Galactorrhea 11/26/2014   Chronic   . Hereditary and idiopathic neuropathy 08/19/2013  . HPV test positive   . Hypercholesteremia 08/19/2013  . Hypertension   . Incomplete bladder emptying   . Microscopic hematuria   . MS (multiple sclerosis) ([place])   . Muscle spasticity 05/21/2014  . Nonspecific findings on examination of urine    other  . Osteopenia   . PONV (postoperative nausea and vomiting)   . Status post laparoscopic supracervical hysterectomy 11/26/2014  . Tobacco user 11/26/2014  . Wrist fracture     Past Surgical History:  Procedure Laterality Date  . bilateral tubal ligation  1996  . BREAST CYST EXCISION Left 2002  . FRACTURE SURGERY    . KNEE SURGERY     right  . LAPAROSCOPIC SUPRACERVICAL HYSTERECTOMY  08/05/2013  . ORIF WRIST FRACTURE Left 01/17/2017   Procedure: OPEN REDUCTION INTERNAL FIXATION  (ORIF) WRIST FRACTURE;  Surgeon: Lovell Sheehan, MD;  Location: [facility];  Service: Orthopedics;  Laterality: Left;  . RADIOLOGY WITH ANESTHESIA N/A 03/18/2020   Procedure: MRI WITH ANESTHESIA CERVICAL SPINE AND BRAIN  WITH AND WITHOUT CONTRAST;  Surgeon: Radiologist, Medication, MD;  Location: [facility];  Service: Radiology;  Laterality: N/A;  . TUBAL LIGATION Bilateral   . VAGINAL HYSTERECTOMY  03/2006    There were no vitals filed for this visit.   Subjective Assessment - 09/16/20 1212    Subjective Patient reports she is feeling good. No falls or LOB, is going shopping after PT    Pertinent History Patient is previous patient of this therapist, was admitted to the hospital on 08/03/19 for MS exacerbation and then went to inpatient rehab on 08/08/19. Patient then received home health therapy and now is returning to outpatient PT. PMh includes HTN, SVT, neurogenic bowel, MS (1995), neuropathy, hx of wrist fx, HPV, Hypercholesteremia, adiposity, osteopenia, spasticity. She wears bilateral AFOs and uses a RW and/or wheelchair for mobility. She drives an adapted car.    Limitations Lifting;Standing;Walking;House hold activities    How long can you sit comfortably? n/a    How long can you stand comfortably? 4 minutes    How long can you walk comfortably? walked 50 ft in inpatient rehab    Patient Stated Goals return  to PLOF, increase walking, get into shower, increase mobility    Currently in Pain? No/denies                    Gait: Patient ambulates120f,3seated rest breaksCGA and wheelchair follow for safety.Ambulate from PT waiting room towards elevator however had to stop due to heat exacerbation. Patient able to navigate opening doors, turns, and thresholds independently but with inc time to perform.Patient demonstrates inc reliance on BUE by end of gait trial.This is patients first attempt and success at elevator in standing position.    Seated:  RTB abduction 15x Adduction  ball squeeze 20x Balloon taps reaching inside/outside BOS x 3 minutes  LAQ modified 10x each LE; RLE AAROM with PT assistance, 2 sets each LE ; decreased motion with fatigue  5lb dumbbells: -bicep curl 10x cues for upright posture -straight arm alternating raise without back support 10x each UE -chest press 10x each UE -single arm bent over row 10x each UE  Ice pack donned throughout session to minimize exacerbation of MS symptoms in order to perform interventions.      Pt educated throughout session about proper posture and technique with exercises. Improved exercise technique, movement at target joints, use of target muscles after min to mod verbal, visual, tactile cues     Patient has improved step length and foot clearance bilaterally with ambulation this session. Increased trunk flexion noted as patient fatigued and utilized UE's more. She is highly motivated throughout physical therapy session. She does require frequent seated rest breaks throughout physical therapy session due to fatigue and overheating.The patient would benefit from continued skilled PT intervention to maximize QOL, safety, and function                       PT Education - 09/16/20 1008    Education provided Yes    Education Details exercise technique, body mechanics    Person(s) Educated Patient    Methods Explanation;Demonstration;Tactile cues;Verbal cues    Comprehension Verbalized understanding;Returned demonstration;Verbal cues required;Tactile cues required            PT Short Term Goals - 08/26/20 1115      PT SHORT TERM GOAL #1   Title Patient will be independent in home exercise program to improve strength/mobility for better functional independence with ADLs.    Baseline 5/19: HEP next session 6/22: HEP compliant 8/10 : HEP progressions compliant 8/31 HEP compliant 10/5: HEP compliant    Time 4    Period Weeks    Status Achieved    Target Date 01/13/20      PT SHORT TERM  GOAL #2   Title Patient will perform a sit to stand with SUE support for increased ind. with transfers.    Baseline 5/19: heavy BUE support 6/22: requires one hand on walker one hand on w/c 8/10: able to perform intermittently (some days can some days cannot) 8/31: attempt next session 10/5: BUE reliance, patient unable to complete 4 full stands. Attempt next session 1/25: able to perform from elevated surface only; 3/22 pt pushes through L arm off of wheelchair with R arm on RW for tactile cue/balance to perform transfer 4/21: requires use of UE's    Time 4    Period Weeks    Status On-going    Target Date 09/23/20             PT Long Term Goals - 08/26/20 0001      PT LONG  TERM GOAL #1   Title Patient will increase FOTO score to equal to or greater than  53/100   to demonstrate statistically significant improvement in mobility and quality of life.    Baseline 8/31: 48.13% 7/27:  48.1% 6/22: 46.99% 5/18: 48/100 10/5: 49/100 11/2: 48%  12/30: 46.9% 1/25: 46.99%; 07/27/2020 FOTO 42% 4/21: 47%    Time 12    Period Weeks    Status Partially Met    Target Date 11/18/20      PT LONG TERM GOAL #2   Title Patient (< 46 years old) will complete five times sit to stand test in < 20 seconds indicating an increased LE strength and improved balance.    Baseline 7/27: 26.8 seconds with one partial stand 6/22: 40. 86 sec; unable to reach full stand last two attempts 5/19: 42 seconds 10/5: BUE reliance, patient unable to complete 4 full stands. Attempt next session 11/2: 23.71 seconds 12/30: 30.32 with one near LOB due to spasms 1/25: 27.11 seconds with one hand on RW one hand on chair; 37.5 seconds with use of BUEs 4/21: 28.2 seconds    Time 12    Period Weeks    Status Partially Met    Target Date 11/18/20      PT LONG TERM GOAL #3   Title Patient will increase 10 meter walk test to <20 seconds as to improve gait speed for better community ambulation and to reduce fall risk.    Baseline 8/31: 35.1  seconds with RW 7/27: 36 seconds with RW 6/22: 36.1 seconds 5/19: 42 seconds 10/5: 31.9 seconds with RW 11/2: 29.9 seconds with RW 12/30: deferred due to spasm 1/25: defer to next session 1/27: 29 seconds with RW; 07/27/2020 deferred; 07/29/2020 41.5 sec (0.24 m/s) 4/21: 32.3 seconds with RW    Time 12    Period Weeks    Status Partially Met    Target Date 11/18/20      PT LONG TERM GOAL #4   Title Patient will reduce timed up and go to <11 seconds to reduce fall risk and demonstrate improved transfer/gait ability.    Baseline 8/31: 50.8 seconds with RW 7/27: 43.65 6/22: 45.4 seconds 5/19: 47 seconds 10/5: 38.5 seconds with RW 11/2: 35.72 seconds with RW 12/30: 43.5 seconds with wheels wet. 1/25: deferred 1/27: 38.9 seconds; 07/27/2020 82 seconds with RW, CGA; 07/29/2020 44 seconds 4/21: 38 seconds with RW    Time 12    Period Weeks    Status Partially Met    Target Date 11/18/20      PT LONG TERM GOAL #5   Title Patient will ambulate from physical therapy gym to elevator down the hall, ride elevator to first floor, and walk to valet with RW and chair follow for increased capacity for mobility and ind.    Baseline 8/31: able to open door ~50 ft with one rest break 8/10: unable to ambulate to elevator without rest break 9/30: able to ambulate to elevator with 3 rest breaks 1/25: 181 ft with 2 seated rest break (to elevator); 07/27/2020 deferred 4/21 able to perform with one seated rest break to elevator unable to get into elevator yet    Time 12    Period Weeks    Status Partially Met    Target Date 11/18/20      PT LONG TERM GOAL #6   Title Patient will be able to stand with RW and lift one leg onto 4" step for carryover to negotiation of  steps at home.    Baseline 11/2: unable to perform 1/25: R foot 0.75 inch lift; 07/27/2020 heel not fully off ground, but pt slightly able to lift R LE to tap side of step while holding onto RW with both hands 4/21: able to clear foot ~3/4 inch with RLE    Time  12    Period Weeks    Status Partially Met    Target Date 11/18/20                 Plan - 09/16/20 1216    Clinical Impression Statement Patient has improved step length and foot clearance bilaterally with ambulation this session. Increased trunk flexion noted as patient fatigued and utilized UE's more. She is highly motivated throughout physical therapy session. She does require frequent seated rest breaks throughout physical therapy session due to fatigue and overheating.The patient would benefit from continued skilled PT intervention to maximize QOL, safety, and function    Personal Factors and Comorbidities Age;Comorbidity 3+;Fitness;Past/Current Experience;Sex;Time since onset of injury/illness/exacerbation    Comorbidities HTN, SVT, neurogenic bowel, MS (1995), neuropathy, hx of wrist fx, HPV, Hypercholesteremia, adiposity, osteopenia, spasticity.    Examination-Activity Limitations Bathing;Bed Mobility;Bend;Carry;Continence;Dressing;Hygiene/Grooming;Stairs;Squat;Reach Overhead;Locomotion Level;Lift;Stand;Transfers    Examination-Participation Restrictions Church;Cleaning;Community Activity;Laundry;Volunteer;Shop;Meal Prep;Yard Work    [employer] Evolving/Moderate complexity    Rehab Potential Good    PT Frequency 2x / week    PT Duration 12 weeks    PT Treatment/Interventions ADLs/Self Care Home Management;Aquatic Therapy;Biofeedback;Cryotherapy;Electrical Stimulation;Iontophoresis 74m/ml Dexamethasone;Traction;Moist Heat;Ultrasound;DME Instruction;Gait training;Stair training;Functional mobility training;Therapeutic activities;Therapeutic exercise;Balance training;Neuromuscular re-education;Patient/family education;Orthotic Fit/Training;Wheelchair mobility training;Manual techniques;Compression bandaging;Passive range of motion;Dry needling;Energy conservation;Taping;Splinting;Vestibular    PT Next Visit Plan step negotiation, turning directions to the L and  R; re-test goals    PT Home Exercise Plan maintain current [facility]P from [facility] Agree with Plan of Care Patient           Patient will benefit from skilled therapeutic intervention in order to improve the following deficits and impairments:  Abnormal gait,Decreased activity tolerance,Decreased balance,Decreased coordination,Decreased endurance,Decreased mobility,Decreased range of motion,Decreased strength,Difficulty walking,Impaired perceived functional ability,Impaired sensation,Impaired tone,Impaired UE functional use,Improper body mechanics,Postural dysfunction,Cardiopulmonary status limiting activity,Impaired flexibility,Increased muscle spasms  Visit Diagnosis: Muscle weakness (generalized)  Other abnormalities of gait and mobility  Unsteadiness on feet     Problem List Patient Active Problem List   Diagnosis Date Noted  . Hypoalbuminemia due to protein-calorie malnutrition ([place]   . Neurogenic bowel   . Neurogenic bladder   . Labile blood pressure   . Neuropathic pain   . Abscess of female pelvis   . SVT (supraventricular tachycardia) ([place]   . Radial styloid tenosynovitis 03/12/2018  . Wheelchair confinement 02/27/2018  . Localized osteoporosis with current pathological fracture with routine healing 01/19/2017  . Wrist fracture 01/16/2017  . Sprain of ankle 03/23/2016  . Closed fracture of lateral malleolus 03/16/2016  . Health care maintenance 01/24/2016  . Blood pressure elevated without history of HTN 10/25/2015  . Essential hypertension 10/25/2015  . Multiple sclerosis ([place] 10/02/2015  . Chronic left shoulder pain 07/19/2015  . Multiple sclerosis exacerbation ([facility] 07/14/2015  . MS (multiple sclerosis) ([place] 11/26/2014  . Increased body mass index 11/26/2014  . HPV test positive 11/26/2014  . Status post laparoscopic supracervical hysterectomy 11/26/2014  . Galactorrhea 11/26/2014  . Back ache 05/21/2014  . Adiposity 05/21/2014  . Disordered  sleep 05/21/2014  . Muscle spasticity 05/21/2014  . Spasticity 05/21/2014  . Calculus of kidney 12/09/2013  .  Renal colic 48/59/2763  . Hypercholesteremia 08/19/2013  . Hereditary and idiopathic neuropathy 08/19/2013  . Hypercholesterolemia without hypertriglyceridemia 08/19/2013  . Bladder infection, chronic 07/25/2012  . Disorder of bladder function 07/25/2012  . Incomplete bladder emptying 07/25/2012  . Microscopic hematuria 07/25/2012  . Right upper quadrant pain 07/25/2012   Janna Arch, PT, DPT   09/16/2020, 12:18 PM  [place] MAIN [facility] SERVICES [address] [place], [place], [address] Phone: [phone]   Fax:  [phone]  Name: Lisa Williams MRN: [id number] Date of Birth: 1960/11/12

## 2020-09-21 ENCOUNTER — Other Ambulatory Visit: Payer: Self-pay

## 2020-09-21 ENCOUNTER — Ambulatory Visit: Payer: PPO

## 2020-09-21 DIAGNOSIS — R2689 Other abnormalities of gait and mobility: Secondary | ICD-10-CM

## 2020-09-21 DIAGNOSIS — M6281 Muscle weakness (generalized): Secondary | ICD-10-CM

## 2020-09-21 DIAGNOSIS — R2681 Unsteadiness on feet: Secondary | ICD-10-CM

## 2020-09-21 NOTE — Therapy (Signed)
[place] MAIN [facility] SERVICES [address] [place], [place], [address] Phone: [phone]   Fax:  [phone]  Physical Therapy Treatment  Patient Details  Name: Lisa Williams MRN: [id number] Date of Birth: 02/27/1961 Referring Provider (PT): Gurney Maxin, MD   Encounter Date: 09/21/2020   PT End of Session - 09/21/20 1004    Visit Number 94    Number of Visits 100    Date for PT Re-Evaluation 11/18/20    Authorization Type 4/10 PN 5/3    PT Start Time 1013    PT Stop Time 1058    PT Time Calculation (min) 45 min    Equipment Utilized During Treatment Gait belt;Other (comment)    Activity Tolerance Patient tolerated treatment well;Patient limited by fatigue    Behavior During Therapy [facility] for tasks assessed/performed           Past Medical History:  Diagnosis Date  . Abdominal pain, right upper quadrant   . Back pain   . Calculus of kidney 12/09/2013  . Chronic back pain    unspecified  . Chronic left shoulder pain 07/19/2015  . Complication of anesthesia   . Functional disorder of bladder    other  . Galactorrhea 11/26/2014   Chronic   . Hereditary and idiopathic neuropathy 08/19/2013  . HPV test positive   . Hypercholesteremia 08/19/2013  . Hypertension   . Incomplete bladder emptying   . Microscopic hematuria   . MS (multiple sclerosis) ([place])   . Muscle spasticity 05/21/2014  . Nonspecific findings on examination of urine    other  . Osteopenia   . PONV (postoperative nausea and vomiting)   . Status post laparoscopic supracervical hysterectomy 11/26/2014  . Tobacco user 11/26/2014  . Wrist fracture     Past Surgical History:  Procedure Laterality Date  . bilateral tubal ligation  1996  . BREAST CYST EXCISION Left 2002  . FRACTURE SURGERY    . KNEE SURGERY     right  . LAPAROSCOPIC SUPRACERVICAL HYSTERECTOMY  08/05/2013  . ORIF WRIST FRACTURE Left 01/17/2017   Procedure: OPEN REDUCTION INTERNAL FIXATION  (ORIF) WRIST FRACTURE;  Surgeon: Lovell Sheehan, MD;  Location: [facility];  Service: Orthopedics;  Laterality: Left;  . RADIOLOGY WITH ANESTHESIA N/A 03/18/2020   Procedure: MRI WITH ANESTHESIA CERVICAL SPINE AND BRAIN  WITH AND WITHOUT CONTRAST;  Surgeon: Radiologist, Medication, MD;  Location: [facility];  Service: Radiology;  Laterality: N/A;  . TUBAL LIGATION Bilateral   . VAGINAL HYSTERECTOMY  03/2006    There were no vitals filed for this visit.   Subjective Assessment - 09/21/20 1240    Subjective Patient reports no falls or LOB, brought her walker for walking today due to it potentially being to hot to ambulate thursday.    Pertinent History Patient is previous patient of this therapist, was admitted to the hospital on 08/03/19 for MS exacerbation and then went to inpatient rehab on 08/08/19. Patient then received home health therapy and now is returning to outpatient PT. PMh includes HTN, SVT, neurogenic bowel, MS (1995), neuropathy, hx of wrist fx, HPV, Hypercholesteremia, adiposity, osteopenia, spasticity. She wears bilateral AFOs and uses a RW and/or wheelchair for mobility. She drives an adapted car.    Limitations Lifting;Standing;Walking;House hold activities    How long can you sit comfortably? n/a    How long can you stand comfortably? 4 minutes    How long can you walk comfortably? walked 50 ft in inpatient rehab  Patient Stated Goals return to PLOF, increase walking, get into shower, increase mobility    Currently in Pain? No/denies                  Gait: Patient ambulates>180f,3seated rest breaksCGA and wheelchair follow for safety. Ambulate from PT gym  to elevators, walking into elevator turning around in narrow space, stabilizing against pertubation of elevator, exiting elevator at next level and walking to valet Patient able to navigate opening doors, turns, and thresholds independently but with inc time to perform. Patient demonstrates inc reliance on BUE by end  of gait trial. This is patients first attempt and success at elevator in standing position.      Seated:  Hamstring lengthening 30 seconds x 2 trials each LE RTB row 15x  RTB ER 10x Ice pack donned throughout session to minimize exacerbation of MS symptoms in order to perform interventions.      Pt educated throughout session about proper posture and technique with exercises. Improved exercise technique, movement at target joints, use of target muscles after min to mod verbal, visual, tactile cues     Patient ambulated from inside PT gym to elevator, stood in elevator, pressed button for floor, turned full 360 in elevator and walked towards valet with only three seated rest breaks indicating maximal distance of ambulation to date. The patient would benefit from continued skilled PT intervention to maximize QOL, safety, and function                    PT Education - 09/21/20 1004    Education provided Yes    Education Details exercise technique, body mechanics    Person(s) Educated Patient    Methods Explanation;Demonstration;Tactile cues;Verbal cues    Comprehension Verbalized understanding;Returned demonstration;Verbal cues required;Tactile cues required            PT Short Term Goals - 08/26/20 1115      PT SHORT TERM GOAL #1   Title Patient will be independent in home exercise program to improve strength/mobility for better functional independence with ADLs.    Baseline 5/19: HEP next session 6/22: HEP compliant 8/10 : HEP progressions compliant 8/31 HEP compliant 10/5: HEP compliant    Time 4    Period Weeks    Status Achieved    Target Date 01/13/20      PT SHORT TERM GOAL #2   Title Patient will perform a sit to stand with SUE support for increased ind. with transfers.    Baseline 5/19: heavy BUE support 6/22: requires one hand on walker one hand on w/c 8/10: able to perform intermittently (some days can some days cannot) 8/31: attempt next session 10/5:  BUE reliance, patient unable to complete 4 full stands. Attempt next session 1/25: able to perform from elevated surface only; 3/22 pt pushes through L arm off of wheelchair with R arm on RW for tactile cue/balance to perform transfer 4/21: requires use of UE's    Time 4    Period Weeks    Status On-going    Target Date 09/23/20             PT Long Term Goals - 08/26/20 0001      PT LONG TERM GOAL #1   Title Patient will increase FOTO score to equal to or greater than  53/100   to demonstrate statistically significant improvement in mobility and quality of life.    Baseline 8/31: 48.13% 7/27:  48.1% 6/22: 46.99% 5/18: 48/100  10/5: 49/100 11/2: 48%  12/30: 46.9% 1/25: 46.99%; 07/27/2020 FOTO 42% 4/21: 47%    Time 12    Period Weeks    Status Partially Met    Target Date 11/18/20      PT LONG TERM GOAL #2   Title Patient (< 11 years old) will complete five times sit to stand test in < 20 seconds indicating an increased LE strength and improved balance.    Baseline 7/27: 26.8 seconds with one partial stand 6/22: 40. 86 sec; unable to reach full stand last two attempts 5/19: 42 seconds 10/5: BUE reliance, patient unable to complete 4 full stands. Attempt next session 11/2: 23.71 seconds 12/30: 30.32 with one near LOB due to spasms 1/25: 27.11 seconds with one hand on RW one hand on chair; 37.5 seconds with use of BUEs 4/21: 28.2 seconds    Time 12    Period Weeks    Status Partially Met    Target Date 11/18/20      PT LONG TERM GOAL #3   Title Patient will increase 10 meter walk test to <20 seconds as to improve gait speed for better community ambulation and to reduce fall risk.    Baseline 8/31: 35.1 seconds with RW 7/27: 36 seconds with RW 6/22: 36.1 seconds 5/19: 42 seconds 10/5: 31.9 seconds with RW 11/2: 29.9 seconds with RW 12/30: deferred due to spasm 1/25: defer to next session 1/27: 29 seconds with RW; 07/27/2020 deferred; 07/29/2020 41.5 sec (0.24 m/s) 4/21: 32.3 seconds with RW     Time 12    Period Weeks    Status Partially Met    Target Date 11/18/20      PT LONG TERM GOAL #4   Title Patient will reduce timed up and go to <11 seconds to reduce fall risk and demonstrate improved transfer/gait ability.    Baseline 8/31: 50.8 seconds with RW 7/27: 43.65 6/22: 45.4 seconds 5/19: 47 seconds 10/5: 38.5 seconds with RW 11/2: 35.72 seconds with RW 12/30: 43.5 seconds with wheels wet. 1/25: deferred 1/27: 38.9 seconds; 07/27/2020 82 seconds with RW, CGA; 07/29/2020 44 seconds 4/21: 38 seconds with RW    Time 12    Period Weeks    Status Partially Met    Target Date 11/18/20      PT LONG TERM GOAL #5   Title Patient will ambulate from physical therapy gym to elevator down the hall, ride elevator to first floor, and walk to valet with RW and chair follow for increased capacity for mobility and ind.    Baseline 8/31: able to open door ~50 ft with one rest break 8/10: unable to ambulate to elevator without rest break 9/30: able to ambulate to elevator with 3 rest breaks 1/25: 181 ft with 2 seated rest break (to elevator); 07/27/2020 deferred 4/21 able to perform with one seated rest break to elevator unable to get into elevator yet    Time 12    Period Weeks    Status Partially Met    Target Date 11/18/20      PT LONG TERM GOAL #6   Title Patient will be able to stand with RW and lift one leg onto 4" step for carryover to negotiation of steps at home.    Baseline 11/2: unable to perform 1/25: R foot 0.75 inch lift; 07/27/2020 heel not fully off ground, but pt slightly able to lift R LE to tap side of step while holding onto RW with both hands 4/21: able  to clear foot ~3/4 inch with RLE    Time 12    Period Weeks    Status Partially Met    Target Date 11/18/20                 Plan - 09/21/20 1241    Clinical Impression Statement Patient ambulated from inside PT gym to elevator, stood in elevator, pressed button for floor, turned full 360 in elevator and walked towards  valet with only three seated rest breaks indicating maximal distance of ambulation to date. The patient would benefit from continued skilled PT intervention to maximize QOL, safety, and function    Personal Factors and Comorbidities Age;Comorbidity 3+;Fitness;Past/Current Experience;Sex;Time since onset of injury/illness/exacerbation    Comorbidities HTN, SVT, neurogenic bowel, MS (1995), neuropathy, hx of wrist fx, HPV, Hypercholesteremia, adiposity, osteopenia, spasticity.    Examination-Activity Limitations Bathing;Bed Mobility;Bend;Carry;Continence;Dressing;Hygiene/Grooming;Stairs;Squat;Reach Overhead;Locomotion Level;Lift;Stand;Transfers    Examination-Participation Restrictions Church;Cleaning;Community Activity;Laundry;Volunteer;Shop;Meal Prep;Yard Work    [employer] Evolving/Moderate complexity    Rehab Potential Good    PT Frequency 2x / week    PT Duration 12 weeks    PT Treatment/Interventions ADLs/Self Care Home Management;Aquatic Therapy;Biofeedback;Cryotherapy;Electrical Stimulation;Iontophoresis 29m/ml Dexamethasone;Traction;Moist Heat;Ultrasound;DME Instruction;Gait training;Stair training;Functional mobility training;Therapeutic activities;Therapeutic exercise;Balance training;Neuromuscular re-education;Patient/family education;Orthotic Fit/Training;Wheelchair mobility training;Manual techniques;Compression bandaging;Passive range of motion;Dry needling;Energy conservation;Taping;Splinting;Vestibular    PT Next Visit Plan step negotiation, turning directions to the L and R; re-test goals    PT Home Exercise Plan [facility] Agree with Plan of Care Patient           Patient will benefit from skilled therapeutic intervention in order to improve the following deficits and impairments:  Abnormal gait,Decreased activity tolerance,Decreased balance,Decreased coordination,Decreased endurance,Decreased mobility,Decreased range of  motion,Decreased strength,Difficulty walking,Impaired perceived functional ability,Impaired sensation,Impaired tone,Impaired UE functional use,Improper body mechanics,Postural dysfunction,Cardiopulmonary status limiting activity,Impaired flexibility,Increased muscle spasms  Visit Diagnosis: Muscle weakness (generalized)  Other abnormalities of gait and mobility  Unsteadiness on feet     Problem List Patient Active Problem List   Diagnosis Date Noted  . Hypoalbuminemia due to protein-calorie malnutrition ([place]   . Neurogenic bowel   . Neurogenic bladder   . Labile blood pressure   . Neuropathic pain   . Abscess of female pelvis   . SVT (supraventricular tachycardia) ([place]   . Radial styloid tenosynovitis 03/12/2018  . Wheelchair confinement 02/27/2018  . Localized osteoporosis with current pathological fracture with routine healing 01/19/2017  . Wrist fracture 01/16/2017  . Sprain of ankle 03/23/2016  . Closed fracture of lateral malleolus 03/16/2016  . Health care maintenance 01/24/2016  . Blood pressure elevated without history of HTN 10/25/2015  . Essential hypertension 10/25/2015  . Multiple sclerosis ([place] 10/02/2015  . Chronic left shoulder pain 07/19/2015  . Multiple sclerosis exacerbation ([place] 07/14/2015  . MS (multiple sclerosis) ([place] 11/26/2014  . Increased body mass index 11/26/2014  . HPV test positive 11/26/2014  . Status post laparoscopic supracervical hysterectomy 11/26/2014  . Galactorrhea 11/26/2014  . Back ache 05/21/2014  . Adiposity 05/21/2014  . Disordered sleep 05/21/2014  . Muscle spasticity 05/21/2014  . Spasticity 05/21/2014  . Calculus of kidney 12/09/2013  . Renal colic 024/26/8341 . Hypercholesteremia 08/19/2013  . Hereditary and idiopathic neuropathy 08/19/2013  . Hypercholesterolemia without hypertriglyceridemia 08/19/2013  . Bladder infection, chronic 07/25/2012  . Disorder of bladder function 07/25/2012  . Incomplete bladder emptying  07/25/2012  . Microscopic hematuria 07/25/2012  . Right upper quadrant pain 07/25/2012   MJanna Arch  PT, DPT   09/21/2020, 12:42 PM  [place] MAIN [facility] SERVICES [address] [place], [place], [address] Phone: [phone]   Fax:  [phone]  Name: Lisa Williams MRN: [id number] Date of Birth: 29-Mar-1961

## 2020-09-23 ENCOUNTER — Other Ambulatory Visit: Payer: Self-pay

## 2020-09-23 ENCOUNTER — Ambulatory Visit: Payer: PPO

## 2020-09-23 DIAGNOSIS — M6281 Muscle weakness (generalized): Secondary | ICD-10-CM

## 2020-09-23 DIAGNOSIS — R2689 Other abnormalities of gait and mobility: Secondary | ICD-10-CM

## 2020-09-23 DIAGNOSIS — R2681 Unsteadiness on feet: Secondary | ICD-10-CM

## 2020-09-23 NOTE — Therapy (Signed)
[place] MAIN [facility] SERVICES [address] [place], [place], [address] Phone: [phone]   Fax:  [phone]  Physical Therapy Treatment  Patient Details  Name: Lisa Williams MRN: [id number] Date of Birth: [birth year] Referring Provider (PT): Gurney Maxin, MD   Encounter Date: 09/23/2020   PT End of Session - 09/23/20 1059    Visit Number 95    Number of Visits 100    Date for PT Re-Evaluation 11/18/20    Authorization Type 5/10 PN 5/3    PT Start Time 1015    PT Stop Time 1059    PT Time Calculation (min) 44 min    Equipment Utilized During Treatment Gait belt;Other (comment)    Activity Tolerance Patient tolerated treatment well;Patient limited by fatigue    Behavior During Therapy [facility] for tasks assessed/performed           Past Medical History:  Diagnosis Date  . Abdominal pain, right upper quadrant   . Back pain   . Calculus of kidney 12/09/2013  . Chronic back pain    unspecified  . Chronic left shoulder pain 07/19/2015  . Complication of anesthesia   . Functional disorder of bladder    other  . Galactorrhea 11/26/2014   Chronic   . Hereditary and idiopathic neuropathy 08/19/2013  . HPV test positive   . Hypercholesteremia 08/19/2013  . Hypertension   . Incomplete bladder emptying   . Microscopic hematuria   . MS (multiple sclerosis) ([place])   . Muscle spasticity 05/21/2014  . Nonspecific findings on examination of urine    other  . Osteopenia   . PONV (postoperative nausea and vomiting)   . Status post laparoscopic supracervical hysterectomy 11/26/2014  . Tobacco user 11/26/2014  . Wrist fracture     Past Surgical History:  Procedure Laterality Date  . bilateral tubal ligation  1996  . BREAST CYST EXCISION Left 2002  . FRACTURE SURGERY    . KNEE SURGERY     right  . LAPAROSCOPIC SUPRACERVICAL HYSTERECTOMY  08/05/2013  . ORIF WRIST FRACTURE Left 01/17/2017   Procedure: OPEN REDUCTION INTERNAL FIXATION  (ORIF) WRIST FRACTURE;  Surgeon: Lovell Sheehan, MD;  Location: [facility] ORS;  Service: Orthopedics;  Laterality: Left;  . RADIOLOGY WITH ANESTHESIA N/A 03/18/2020   Procedure: MRI WITH ANESTHESIA CERVICAL SPINE AND BRAIN  WITH AND WITHOUT CONTRAST;  Surgeon: Radiologist, Medication, MD;  Location: [place];  Service: Radiology;  Laterality: N/A;  . TUBAL LIGATION Bilateral   . VAGINAL HYSTERECTOMY  03/2006    There were no vitals filed for this visit.   Subjective Assessment - 09/23/20 1028    Subjective Patient results fatigue from the heat. No falls or LOB since last session.    Pertinent History Patient is previous patient of this therapist, was admitted to the hospital on 08/03/19 for MS exacerbation and then went to inpatient rehab on 08/08/19. Patient then received home health therapy and now is returning to outpatient PT. PMh includes HTN, SVT, neurogenic bowel, MS (1995), neuropathy, hx of wrist fx, HPV, Hypercholesteremia, adiposity, osteopenia, spasticity. She wears bilateral AFOs and uses a RW and/or wheelchair for mobility. She drives an adapted car.    Limitations Lifting;Standing;Walking;House hold activities    How long can you sit comfortably? n/a    How long can you stand comfortably? 4 minutes    How long can you walk comfortably? walked 50 ft in inpatient rehab    Patient Stated Goals return to PLOF,  increase walking, get into shower, increase mobility    Currently in Pain? No/denies                Standing in // bars:  -Standing weight shifts 2x10 each side; UE support focus on hip shift rather than pull with arm -modified tandem stance 20 second holds very challenging with LLE posterior placement -modified squat with chair behind 10x -terminated after 5 due to increased trembling        Seated:    Hamstring isometric on dynadisc 10x 3 second holds each LE  Hamstring stretch on PT leg 2x30 seconds; x 2 sets  Adduction ball squeeze 15x Modified windmills 10x each  UE Angels wing flaps (full range abduction ) 10x RTB row 15x  AAROM LAQ 10x each LE      Ice pack donned throughout session to minimize exacerbation of MS symptoms in order to perform interventions.      Pt educated throughout session about proper posture and technique with exercises. Improved exercise technique, movement at target joints, use of target muscles after min to mod verbal, visual, tactile cues     Patient requires increase rest breaks and ice applied due to temperatures outside affecting patient. She fatigued more quickly this session however remains highly motivated throughout session. Core stabilization for carryover to standing and ambulation is challenging and will continue to be an area of focus. The patient would benefit from continued skilled PT intervention to maximize QOL, safety, and function                  PT Education - 09/23/20 1058    Education provided Yes    Education Details exercise technique, body mechanics    Person(s) Educated Patient    Methods Explanation;Demonstration;Tactile cues;Verbal cues    Comprehension Verbalized understanding;Returned demonstration;Verbal cues required;Tactile cues required            PT Short Term Goals - 08/26/20 1115      PT SHORT TERM GOAL #1   Title Patient will be independent in home exercise program to improve strength/mobility for better functional independence with ADLs.    Baseline 5/19: HEP next session 6/22: HEP compliant 8/10 : HEP progressions compliant 8/31 HEP compliant 10/5: HEP compliant    Time 4    Period Weeks    Status Achieved    Target Date 01/13/20      PT SHORT TERM GOAL #2   Title Patient will perform a sit to stand with SUE support for increased ind. with transfers.    Baseline 5/19: heavy BUE support 6/22: requires one hand on walker one hand on w/c 8/10: able to perform intermittently (some days can some days cannot) 8/31: attempt next session 10/5: BUE reliance, patient  unable to complete 4 full stands. Attempt next session 1/25: able to perform from elevated surface only; 3/22 pt pushes through L arm off of wheelchair with R arm on RW for tactile cue/balance to perform transfer 4/21: requires use of UE's    Time 4    Period Weeks    Status On-going    Target Date 09/23/20             PT Long Term Goals - 08/26/20 0001      PT LONG TERM GOAL #1   Title Patient will increase FOTO score to equal to or greater than  53/100   to demonstrate statistically significant improvement in mobility and quality of life.    Baseline 8/31:  48.13% 7/27:  48.1% 6/22: 46.99% 5/18: 48/100 10/5: 49/100 11/2: 48%  12/30: 46.9% 1/25: 46.99%; 07/27/2020 FOTO 42% 4/21: 47%    Time 12    Period Weeks    Status Partially Met    Target Date 11/18/20      PT LONG TERM GOAL #2   Title Patient (< 60 years old) will complete five times sit to stand test in < 20 seconds indicating an increased LE strength and improved balance.    Baseline 7/27: 26.8 seconds with one partial stand 6/22: 40. 86 sec; unable to reach full stand last two attempts 5/19: 42 seconds 10/5: BUE reliance, patient unable to complete 4 full stands. Attempt next session 11/2: 23.71 seconds 12/30: 30.32 with one near LOB due to spasms 1/25: 27.11 seconds with one hand on RW one hand on chair; 37.5 seconds with use of BUEs 4/21: 28.2 seconds    Time 12    Period Weeks    Status Partially Met    Target Date 11/18/20      PT LONG TERM GOAL #3   Title Patient will increase 10 meter walk test to <20 seconds as to improve gait speed for better community ambulation and to reduce fall risk.    Baseline 8/31: 35.1 seconds with RW 7/27: 36 seconds with RW 6/22: 36.1 seconds 5/19: 42 seconds 10/5: 31.9 seconds with RW 11/2: 29.9 seconds with RW 12/30: deferred due to spasm 1/25: defer to next session 1/27: 29 seconds with RW; 07/27/2020 deferred; 07/29/2020 41.5 sec (0.24 m/s) 4/21: 32.3 seconds with RW    Time 12    Period  Weeks    Status Partially Met    Target Date 11/18/20      PT LONG TERM GOAL #4   Title Patient will reduce timed up and go to <11 seconds to reduce fall risk and demonstrate improved transfer/gait ability.    Baseline 8/31: 50.8 seconds with RW 7/27: 43.65 6/22: 45.4 seconds 5/19: 47 seconds 10/5: 38.5 seconds with RW 11/2: 35.72 seconds with RW 12/30: 43.5 seconds with wheels wet. 1/25: deferred 1/27: 38.9 seconds; 07/27/2020 82 seconds with RW, CGA; 07/29/2020 44 seconds 4/21: 38 seconds with RW    Time 12    Period Weeks    Status Partially Met    Target Date 11/18/20      PT LONG TERM GOAL #5   Title Patient will ambulate from physical therapy gym to elevator down the hall, ride elevator to first floor, and walk to valet with RW and chair follow for increased capacity for mobility and ind.    Baseline 8/31: able to open door ~50 ft with one rest break 8/10: unable to ambulate to elevator without rest break 9/30: able to ambulate to elevator with 3 rest breaks 1/25: 181 ft with 2 seated rest break (to elevator); 07/27/2020 deferred 4/21 able to perform with one seated rest break to elevator unable to get into elevator yet    Time 12    Period Weeks    Status Partially Met    Target Date 11/18/20      PT LONG TERM GOAL #6   Title Patient will be able to stand with RW and lift one leg onto 4" step for carryover to negotiation of steps at home.    Baseline 11/2: unable to perform 1/25: R foot 0.75 inch lift; 07/27/2020 heel not fully off ground, but pt slightly able to lift R LE to tap side of step while  holding onto RW with both hands 4/21: able to clear foot ~3/4 inch with RLE    Time 12    Period Weeks    Status Partially Met    Target Date 11/18/20                 Plan - 09/23/20 1255    Clinical Impression Statement Patient requires increase rest breaks and ice applied due to temperatures outside affecting patient. She fatigued more quickly this session however remains highly  motivated throughout session. Core stabilization for carryover to standing and ambulation is challenging and will continue to be an area of focus. The patient would benefit from continued skilled PT intervention to maximize QOL, safety, and function    Personal Factors and Comorbidities Age;Comorbidity 3+;Fitness;Past/Current Experience;Sex;Time since onset of injury/illness/exacerbation    Comorbidities HTN, SVT, neurogenic bowel, MS (1995), neuropathy, hx of wrist fx, HPV, Hypercholesteremia, adiposity, osteopenia, spasticity.    Examination-Activity Limitations Bathing;Bed Mobility;Bend;Carry;Continence;Dressing;Hygiene/Grooming;Stairs;Squat;Reach Overhead;Locomotion Level;Lift;Stand;Transfers    Examination-Participation Restrictions Church;Cleaning;Community Activity;Laundry;Volunteer;Shop;Meal Prep;Yard Work    [employer] Evolving/Moderate complexity    Rehab Potential Good    PT Frequency 2x / week    PT Duration 12 weeks    PT Treatment/Interventions ADLs/Self Care Home Management;Aquatic Therapy;Biofeedback;Cryotherapy;Electrical Stimulation;Iontophoresis 58m/ml Dexamethasone;Traction;Moist Heat;Ultrasound;DME Instruction;Gait training;Stair training;Functional mobility training;Therapeutic activities;Therapeutic exercise;Balance training;Neuromuscular re-education;Patient/family education;Orthotic Fit/Training;Wheelchair mobility training;Manual techniques;Compression bandaging;Passive range of motion;Dry needling;Energy conservation;Taping;Splinting;Vestibular    PT Next Visit Plan step negotiation, turning directions to the L and R; re-test goals    PT Home Exercise Plan [place]gree with Plan of Care Patient           Patient will benefit from skilled therapeutic intervention in order to improve the following deficits and impairments:  Abnormal gait,Decreased activity tolerance,Decreased balance,Decreased  coordination,Decreased endurance,Decreased mobility,Decreased range of motion,Decreased strength,Difficulty walking,Impaired perceived functional ability,Impaired sensation,Impaired tone,Impaired UE functional use,Improper body mechanics,Postural dysfunction,Cardiopulmonary status limiting activity,Impaired flexibility,Increased muscle spasms  Visit Diagnosis: Muscle weakness (generalized)  Other abnormalities of gait and mobility  Unsteadiness on feet     Problem List Patient Active Problem List   Diagnosis Date Noted  . Hypoalbuminemia due to protein-calorie malnutrition ([place]   . Neurogenic bowel   . Neurogenic bladder   . Labile blood pressure   . Neuropathic pain   . Abscess of female pelvis   . SVT (supraventricular tachycardia) ([place] [address]   . Radial styloid tenosynovitis 03/12/2018  . Wheelchair confinement 02/27/2018  . Localized osteoporosis with current pathological fracture with routine healing 01/19/2017  . Wrist fracture 01/16/2017  . Sprain of ankle 03/23/2016  . Closed fracture of lateral malleolus 03/16/2016  . Health care maintenance 01/24/2016  . Blood pressure elevated without history of HTN 10/25/2015  . Essential hypertension 10/25/2015  . Multiple sclerosis ([place] 10/02/2015  . Chronic left shoulder pain 07/19/2015  . [place] exacerbation ([place] 07/14/2015  . MS (multiple sclerosis) ([place] 11/26/2014  . Increased body mass index 11/26/2014  . HPV test positive 11/26/2014  . Status post laparoscopic supracervical hysterectomy 11/26/2014  . Galactorrhea 11/26/2014  . Back ache 05/21/2014  . Adiposity 05/21/2014  . Disordered sleep 05/21/2014  . Muscle spasticity 05/21/2014  . Spasticity 05/21/2014  . Calculus of kidney 12/09/2013  . Renal colic 033/29/[id number] . Hypercholesteremia 08/19/2013  . Hereditary and idiopathic neuropathy 08/19/2013  . Hypercholesterolemia without hypertriglyceridemia 08/19/2013  . Bladder infection, chronic 07/25/2012  .  Disorder of bladder function 07/25/2012  . Incomplete bladder emptying 07/25/2012  .  Microscopic hematuria 07/25/2012  . Right upper quadrant pain 07/25/2012   Janna Arch, PT, DPT   09/23/2020, 12:56 PM  [place] MAIN [facility] SERVICES [address] [place], [place], [address] Phone: [phone]   Fax:  [phone]  Name: Lisa Williams MRN: [id number] Date of Birth: Nov 14, 1960

## 2020-09-28 ENCOUNTER — Ambulatory Visit: Payer: PPO

## 2020-09-28 ENCOUNTER — Other Ambulatory Visit: Payer: Self-pay

## 2020-09-28 DIAGNOSIS — M6281 Muscle weakness (generalized): Secondary | ICD-10-CM

## 2020-09-28 DIAGNOSIS — R2681 Unsteadiness on feet: Secondary | ICD-10-CM

## 2020-09-28 DIAGNOSIS — R2689 Other abnormalities of gait and mobility: Secondary | ICD-10-CM

## 2020-09-28 NOTE — Therapy (Signed)
[place] MAIN [facility] SERVICES [address] [place], [place], [address] Phone: [phone]   Fax:  [phone]  Physical Therapy Treatment  Patient Details  Name: Lisa Williams MRN: [id number] Date of Birth: [birth year] No data recorded  Encounter Date: 09/28/2020   PT End of Session - 09/28/20 1321    Visit Number 96    Number of Visits 100    Date for PT Re-Evaluation 11/18/20    Authorization Type 6/10 PN 5/3    PT Start Time 1015    PT Stop Time 1100    PT Time Calculation (min) 45 min    Equipment Utilized During Treatment Gait belt;Other (comment)    Activity Tolerance Patient tolerated treatment well;Patient limited by fatigue    Behavior During Therapy [facility] for tasks assessed/performed           Past Medical History:  Diagnosis Date  . Abdominal pain, right upper quadrant   . Back pain   . Calculus of kidney 12/09/2013  . Chronic back pain    unspecified  . Chronic left shoulder pain 07/19/2015  . Complication of anesthesia   . Functional disorder of bladder    other  . Galactorrhea 11/26/2014   Chronic   . Hereditary and idiopathic neuropathy 08/19/2013  . HPV test positive   . Hypercholesteremia 08/19/2013  . Hypertension   . Incomplete bladder emptying   . Microscopic hematuria   . MS (multiple sclerosis) ([place])   . Muscle spasticity 05/21/2014  . Nonspecific findings on examination of urine    other  . Osteopenia   . PONV (postoperative nausea and vomiting)   . Status post laparoscopic supracervical hysterectomy 11/26/2014  . Tobacco user 11/26/2014  . Wrist fracture     Past Surgical History:  Procedure Laterality Date  . bilateral tubal ligation  1996  . BREAST CYST EXCISION Left 2002  . FRACTURE SURGERY    . KNEE SURGERY     right  . LAPAROSCOPIC SUPRACERVICAL HYSTERECTOMY  08/05/2013  . ORIF WRIST FRACTURE Left 01/17/2017   Procedure: OPEN REDUCTION INTERNAL FIXATION (ORIF) WRIST FRACTURE;  Surgeon:  Lovell Sheehan, MD;  Location: [facility] ORS;  Service: Orthopedics;  Laterality: Left;  . RADIOLOGY WITH ANESTHESIA N/A 03/18/2020   Procedure: MRI WITH ANESTHESIA CERVICAL SPINE AND BRAIN  WITH AND WITHOUT CONTRAST;  Surgeon: Radiologist, Medication, MD;  Location: [place];  Service: Radiology;  Laterality: N/A;  . TUBAL LIGATION Bilateral   . VAGINAL HYSTERECTOMY  03/2006    There were no vitals filed for this visit.   Subjective Assessment - 09/28/20 1234    Subjective Patient stayed inside over the weekend because of the heat. She had one episode of weakness when she overheated but was safe and managed her symptoms well.    Pertinent History Patient is previous patient of this therapist, was admitted to the hospital on 08/03/19 for MS exacerbation and then went to inpatient rehab on 08/08/19. Patient then received home health therapy and now is returning to outpatient PT. PMh includes HTN, SVT, neurogenic bowel, MS (1995), neuropathy, hx of wrist fx, HPV, Hypercholesteremia, adiposity, osteopenia, spasticity. She wears bilateral AFOs and uses a RW and/or wheelchair for mobility. She drives an adapted car.    Limitations Lifting;Standing;Walking;House hold activities    How long can you sit comfortably? n/a    How long can you stand comfortably? 4 minutes    How long can you walk comfortably? walked 50 ft in inpatient  rehab    Patient Stated Goals return to PLOF, increase walking, get into shower, increase mobility    Currently in Pain? No/denies                   Standing in // bars:  -Standing weight shifts 2x10 each side; UE support focus on hip shift rather than pull with arm -PVC pipe raises 10x, 2 sets, seated rest between interventions  -modified squat with chair behind 10x -terminated after 5 due to increased trembling  -knee flexion into theraband strung across // bars with focus on dissociating L and RLE 8x each LE   Seated:  Hamstring isometric on dynadisc 10x 3 second holds  each LE  Large swiss ball forward and diagonal stretch 2 minutes Hamstring stretch on PT leg 2x30 seconds; x 2 sets  Adduction ball squeeze 15x Modified windmills 10x each UE AAROM LAQ 10x each LE   Seated boxing: cues for sequencing, body mechanics, and pace/rhythm for functional contraction and timing of muscle recruitment: -Cross body punches to mitts on PT hands with no back support for focused core stabilization with crossing midline 2x 60 seconds  -Cross body punch with secondary combination of elbow for full cross body rotation to PT mitt for trunk stability, coordination, and cardiovascular challenge x 60 seconds -Upper cut to mitts in PT hands for core activation without back support with perturbations 60 seconds    Ice pack donned throughout session to minimize exacerbation of MS symptoms in order to perform interventions.      Pt educated throughout session about proper posture and technique with exercises. Improved exercise technique, movement at target joints, use of target muscles after min to mod verbal, visual, tactile cues  Patient is highly motivated throughout physical therapy session and is able to tolerate prolonged standing this session. Seated coordination and stabilization interventions additionally tolerated well with patient very fatigued by end of session. Patient does have intermittent back spasms that reduced after large swiss ball forward stretch.  The patient would benefit from continued skilled PT intervention to maximize QOL, safety, and function                      PT Education - 09/28/20 1236    Education provided Yes    Education Details exercise technique, body mechanics    Person(s) Educated Patient    Methods Explanation;Demonstration;Tactile cues;Verbal cues    Comprehension Verbalized understanding;Returned demonstration;Tactile cues required;Verbal cues required            PT Short Term Goals - 08/26/20 1115      PT SHORT  TERM GOAL #1   Title Patient will be independent in home exercise program to improve strength/mobility for better functional independence with ADLs.    Baseline 5/19: HEP next session 6/22: HEP compliant 8/10 : HEP progressions compliant 8/31 HEP compliant 10/5: HEP compliant    Time 4    Period Weeks    Status Achieved    Target Date 01/13/20      PT SHORT TERM GOAL #2   Title Patient will perform a sit to stand with SUE support for increased ind. with transfers.    Baseline 5/19: heavy BUE support 6/22: requires one hand on walker one hand on w/c 8/10: able to perform intermittently (some days can some days cannot) 8/31: attempt next session 10/5: BUE reliance, patient unable to complete 4 full stands. Attempt next session 1/25: able to perform from elevated surface only; 3/22  pt pushes through L arm off of wheelchair with R arm on RW for tactile cue/balance to perform transfer 4/21: requires use of UE's    Time 4    Period Weeks    Status On-going    Target Date 09/23/20             PT Long Term Goals - 08/26/20 0001      PT LONG TERM GOAL #1   Title Patient will increase FOTO score to equal to or greater than  53/100   to demonstrate statistically significant improvement in mobility and quality of life.    Baseline 8/31: 48.13% 7/27:  48.1% 6/22: 46.99% 5/18: 48/100 10/5: 49/100 11/2: 48%  12/30: 46.9% 1/25: 46.99%; 07/27/2020 FOTO 42% 4/21: 47%    Time 12    Period Weeks    Status Partially Met    Target Date 11/18/20      PT LONG TERM GOAL #2   Title Patient (< 22 years old) will complete five times sit to stand test in < 20 seconds indicating an increased LE strength and improved balance.    Baseline 7/27: 26.8 seconds with one partial stand 6/22: 40. 86 sec; unable to reach full stand last two attempts 5/19: 42 seconds 10/5: BUE reliance, patient unable to complete 4 full stands. Attempt next session 11/2: 23.71 seconds 12/30: 30.32 with one near LOB due to spasms 1/25: 27.11  seconds with one hand on RW one hand on chair; 37.5 seconds with use of BUEs 4/21: 28.2 seconds    Time 12    Period Weeks    Status Partially Met    Target Date 11/18/20      PT LONG TERM GOAL #3   Title Patient will increase 10 meter walk test to <20 seconds as to improve gait speed for better community ambulation and to reduce fall risk.    Baseline 8/31: 35.1 seconds with RW 7/27: 36 seconds with RW 6/22: 36.1 seconds 5/19: 42 seconds 10/5: 31.9 seconds with RW 11/2: 29.9 seconds with RW 12/30: deferred due to spasm 1/25: defer to next session 1/27: 29 seconds with RW; 07/27/2020 deferred; 07/29/2020 41.5 sec (0.24 m/s) 4/21: 32.3 seconds with RW    Time 12    Period Weeks    Status Partially Met    Target Date 11/18/20      PT LONG TERM GOAL #4   Title Patient will reduce timed up and go to <11 seconds to reduce fall risk and demonstrate improved transfer/gait ability.    Baseline 8/31: 50.8 seconds with RW 7/27: 43.65 6/22: 45.4 seconds 5/19: 47 seconds 10/5: 38.5 seconds with RW 11/2: 35.72 seconds with RW 12/30: 43.5 seconds with wheels wet. 1/25: deferred 1/27: 38.9 seconds; 07/27/2020 82 seconds with RW, CGA; 07/29/2020 44 seconds 4/21: 38 seconds with RW    Time 12    Period Weeks    Status Partially Met    Target Date 11/18/20      PT LONG TERM GOAL #5   Title Patient will ambulate from physical therapy gym to elevator down the hall, ride elevator to first floor, and walk to valet with RW and chair follow for increased capacity for mobility and ind.    Baseline 8/31: able to open door ~50 ft with one rest break 8/10: unable to ambulate to elevator without rest break 9/30: able to ambulate to elevator with 3 rest breaks 1/25: 181 ft with 2 seated rest break (to elevator); 07/27/2020 deferred 4/21  able to perform with one seated rest break to elevator unable to get into elevator yet    Time 12    Period Weeks    Status Partially Met    Target Date 11/18/20      PT LONG TERM GOAL #6    Title Patient will be able to stand with RW and lift one leg onto 4" step for carryover to negotiation of steps at home.    Baseline 11/2: unable to perform 1/25: R foot 0.75 inch lift; 07/27/2020 heel not fully off ground, but pt slightly able to lift R LE to tap side of step while holding onto RW with both hands 4/21: able to clear foot ~3/4 inch with RLE    Time 12    Period Weeks    Status Partially Met    Target Date 11/18/20                 Plan - 09/28/20 1323    Clinical Impression Statement Patient is highly motivated throughout physical therapy session and is able to tolerate prolonged standing this session. Seated coordination and stabilization interventions additionally tolerated well with patient very fatigued by end of session. Patient does have intermittent back spasms that reduced after large swiss ball forward stretch.  The patient would benefit from continued skilled PT intervention to maximize QOL, safety, and function    Personal Factors and Comorbidities Age;Comorbidity 3+;Fitness;Past/Current Experience;Sex;Time since onset of injury/illness/exacerbation    Comorbidities HTN, SVT, neurogenic bowel, MS (1995), neuropathy, hx of wrist fx, HPV, Hypercholesteremia, adiposity, osteopenia, spasticity.    Examination-Activity Limitations Bathing;Bed Mobility;Bend;Carry;Continence;Dressing;Hygiene/Grooming;Stairs;Squat;Reach Overhead;Locomotion Level;Lift;Stand;Transfers    Examination-Participation Restrictions Church;Cleaning;Community Activity;Laundry;Volunteer;Shop;Meal Prep;Yard Work    [employer] Evolving/Moderate complexity    Rehab Potential Good    PT Frequency 2x / week    PT Duration 12 weeks    PT Treatment/Interventions ADLs/Self Care Home Management;Aquatic Therapy;Biofeedback;Cryotherapy;Electrical Stimulation;Iontophoresis 19m/ml Dexamethasone;Traction;Moist Heat;Ultrasound;DME Instruction;Gait training;Stair training;Functional  mobility training;Therapeutic activities;Therapeutic exercise;Balance training;Neuromuscular re-education;Patient/family education;Orthotic Fit/Training;Wheelchair mobility training;Manual techniques;Compression bandaging;Passive range of motion;Dry needling;Energy conservation;Taping;Splinting;Vestibular    PT Next Visit Plan step negotiation, turning directions to the L and R; re-test goals    PT Home Exercise Plan maintain [place]P [place] Agree with Plan of Care Patient           Patient will benefit from skilled therapeutic intervention in order to improve the following deficits and impairments:  Abnormal gait,Decreased activity tolerance,Decreased balance,Decreased coordination,Decreased endurance,Decreased mobility,Decreased range of motion,Decreased strength,Difficulty walking,Impaired perceived functional ability,Impaired sensation,Impaired tone,Impaired UE functional use,Improper body mechanics,Postural dysfunction,Cardiopulmonary status limiting activity,Impaired flexibility,Increased muscle spasms  Visit Diagnosis: Muscle weakness (generalized)  Other abnormalities of gait and mobility  Unsteadiness on feet     Problem List Patient Active Problem List   Diagnosis Date Noted  . Hypoalbuminemia due to protein-calorie malnutrition ([place]   . Neurogenic bowel   . Neurogenic bladder   . Labile blood pressure   . Neuropathic pain   . Abscess of female pelvis   . SVT (supraventricular tachycardia) ([place]   . Radial styloid tenosynovitis 03/12/2018  . Wheelchair confinement 02/27/2018  . Localized osteoporosis with current pathological fracture with routine healing 01/19/2017  . Wrist fracture 01/16/2017  . Sprain of ankle 03/23/2016  . Closed fracture of lateral malleolus 03/16/2016  . Health care maintenance 01/24/2016  . Blood pressure elevated without history of HTN 10/25/2015  . Essential hypertension 10/25/2015  . Multiple sclerosis ([place]  10/02/2015  . Chronic left shoulder  pain 07/19/2015  . Multiple sclerosis exacerbation ([place]) 07/14/2015  . MS (multiple sclerosis) ([place]) 11/26/2014  . Increased body mass index 11/26/2014  . HPV test positive 11/26/2014  . Status post laparoscopic supracervical hysterectomy 11/26/2014  . Galactorrhea 11/26/2014  . Back ache 05/21/2014  . Adiposity 05/21/2014  . Disordered sleep 05/21/2014  . Muscle spasticity 05/21/2014  . Spasticity 05/21/2014  . Calculus of kidney 12/09/2013  . Renal colic 06/07/4386  . Hypercholesteremia 08/19/2013  . Hereditary and idiopathic neuropathy 08/19/2013  . Hypercholesterolemia without hypertriglyceridemia 08/19/2013  . Bladder infection, chronic 07/25/2012  . Disorder of bladder function 07/25/2012  . Incomplete bladder emptying 07/25/2012  . Microscopic hematuria 07/25/2012  . Right upper quadrant pain 07/25/2012   Janna Arch, PT, DPT   09/28/2020, 1:23 PM  [place] MAIN [facility] SERVICES [address] [place], [place], [address] Phone: [phone]   Fax:  [phone]  Name: Diane Hanel Treadwell MRN: [id number] Date of Birth: 02/18/1961

## 2020-09-30 ENCOUNTER — Ambulatory Visit: Payer: PPO

## 2020-09-30 ENCOUNTER — Other Ambulatory Visit: Payer: Self-pay

## 2020-09-30 DIAGNOSIS — R2681 Unsteadiness on feet: Secondary | ICD-10-CM

## 2020-09-30 DIAGNOSIS — M6281 Muscle weakness (generalized): Secondary | ICD-10-CM | POA: Diagnosis not present

## 2020-09-30 DIAGNOSIS — G35 Multiple sclerosis: Secondary | ICD-10-CM

## 2020-09-30 DIAGNOSIS — R2689 Other abnormalities of gait and mobility: Secondary | ICD-10-CM

## 2020-09-30 NOTE — Therapy (Signed)
[place] MAIN [facility] SERVICES [address] [place], [place], [address] Phone: [phone]   Fax:  [phone]  Physical Therapy Treatment  Patient Details  Name: Lisa Williams MRN: [id number] Date of Birth: Mar 24, 1961 No data recorded  Encounter Date: 09/30/2020   PT End of Session - 09/30/20 1326    Visit Number 97    Number of Visits 100    Date for PT Re-Evaluation 11/18/20    Authorization Type 7/10 PN 5/3    PT Start Time 1015    PT Stop Time 1059    PT Time Calculation (min) 44 min    Equipment Utilized During Treatment Gait belt;Other (comment)    Activity Tolerance Patient tolerated treatment well;Patient limited by fatigue    Behavior During Therapy [facility] for tasks assessed/performed           Past Medical History:  Diagnosis Date  . Abdominal pain, right upper quadrant   . Back pain   . Calculus of kidney 12/09/2013  . Chronic back pain    unspecified  . Chronic left shoulder pain 07/19/2015  . Complication of anesthesia   . Functional disorder of bladder    other  . Galactorrhea 11/26/2014   Chronic   . Hereditary and idiopathic neuropathy 08/19/2013  . HPV test positive   . Hypercholesteremia 08/19/2013  . Hypertension   . Incomplete bladder emptying   . Microscopic hematuria   . MS (multiple sclerosis) ([place])   . Muscle spasticity 05/21/2014  . Nonspecific findings on examination of urine    other  . Osteopenia   . PONV (postoperative nausea and vomiting)   . Status post laparoscopic supracervical hysterectomy 11/26/2014  . Tobacco user 11/26/2014  . Wrist fracture     Past Surgical History:  Procedure Laterality Date  . bilateral tubal ligation  1996  . BREAST CYST EXCISION Left 2002  . FRACTURE SURGERY    . KNEE SURGERY     right  . LAPAROSCOPIC SUPRACERVICAL HYSTERECTOMY  08/05/2013  . ORIF WRIST FRACTURE Left 01/17/2017   Procedure: OPEN REDUCTION INTERNAL FIXATION (ORIF) WRIST FRACTURE;  Surgeon:  Lovell Sheehan, MD;  Location: [facility];  Service: Orthopedics;  Laterality: Left;  . RADIOLOGY WITH ANESTHESIA N/A 03/18/2020   Procedure: MRI WITH ANESTHESIA CERVICAL SPINE AND BRAIN  WITH AND WITHOUT CONTRAST;  Surgeon: Radiologist, Medication, MD;  Location: [facility];  Service: Radiology;  Laterality: N/A;  . TUBAL LIGATION Bilateral   . VAGINAL HYSTERECTOMY  03/2006    There were no vitals filed for this visit.   Subjective Assessment - 09/30/20 1324    Subjective Patient is eager to walk today. Reports she is feeling good, no spasms today.    Pertinent History Patient is previous patient of this therapist, was admitted to the hospital on 08/03/19 for MS exacerbation and then went to inpatient rehab on 08/08/19. Patient then received home health therapy and now is returning to outpatient PT. PMh includes HTN, SVT, neurogenic bowel, MS (1995), neuropathy, hx of wrist fx, HPV, Hypercholesteremia, adiposity, osteopenia, spasticity. She wears bilateral AFOs and uses a RW and/or wheelchair for mobility. She drives an adapted car.    Limitations Lifting;Standing;Walking;House hold activities    How long can you sit comfortably? n/a    How long can you stand comfortably? 4 minutes    How long can you walk comfortably? walked 50 ft in inpatient rehab    Patient Stated Goals return to PLOF, increase walking, get  into shower, increase mobility    Currently in Pain? No/denies              Gait: Patient ambulates >166f, 3 seated rest breaks CGA and wheelchair follow for safety. Ambulate hospital front door, to elevators, walking into elevator turning around in narrow space, stabilizing against pertubation of elevator, exiting elevator at next level and bathrooms on lower level Patient able to navigate opening doors, turns, and thresholds independently but with inc time to perform. Patient demonstrates inc reliance on BUE by end of gait trial. This is patients first attempt and success at elevator in  standing position.      Seated:  Hamstring lengthening 30 seconds x 2 trials each LE PVC pipe row 15x PVC pipe rainbow cross body rotations 8x PVC pipe bicep curl into overhead raise 10x PVC pipe row into overhead press 10x Green ball between knees adduction 10x 3 second holds RTB around bilateral knees abduction 10x  Ice pack donned throughout session to minimize exacerbation of MS symptoms in order to perform interventions.      Pt educated throughout session about proper posture and technique with exercises. Improved exercise technique, movement at target joints, use of target muscles after min to mod verbal, visual, tactile cues     Patient is highly motivated and able to negotiate crowded front lobby of hospital with walking as well as wait for elevator with people rapidly walking past. Ambulation terminated earlier due to fatigue resulting in hold body trembling with patient making it to restrooms on lower level rather than down the hall to the rehab gym. The patient would benefit from continued skilled PT intervention to maximize QOL, safety, and function                          PT Education - 09/30/20 1325    Education provided Yes    Education Details exercise technique, body mechanics    Person(s) Educated Patient    Methods Explanation;Demonstration;Verbal cues;Tactile cues    Comprehension Verbalized understanding;Returned demonstration;Tactile cues required;Verbal cues required            PT Short Term Goals - 08/26/20 1115      PT SHORT TERM GOAL #1   Title Patient will be independent in home exercise program to improve strength/mobility for better functional independence with ADLs.    Baseline 5/19: HEP next session 6/22: HEP compliant 8/10 : HEP progressions compliant 8/31 HEP compliant 10/5: HEP compliant    Time 4    Period Weeks    Status Achieved    Target Date 01/13/20      PT SHORT TERM GOAL #2   Title Patient will perform a sit  to stand with SUE support for increased ind. with transfers.    Baseline 5/19: heavy BUE support 6/22: requires one hand on walker one hand on w/c 8/10: able to perform intermittently (some days can some days cannot) 8/31: attempt next session 10/5: BUE reliance, patient unable to complete 4 full stands. Attempt next session 1/25: able to perform from elevated surface only; 3/22 pt pushes through L arm off of wheelchair with R arm on RW for tactile cue/balance to perform transfer 4/21: requires use of UE's    Time 4    Period Weeks    Status On-going    Target Date 09/23/20             PT Long Term Goals - 08/26/20 0001  PT LONG TERM GOAL #1   Title Patient will increase FOTO score to equal to or greater than  53/100   to demonstrate statistically significant improvement in mobility and quality of life.    Baseline 8/31: 48.13% 7/27:  48.1% 6/22: 46.99% 5/18: 48/100 10/5: 49/100 11/2: 48%  12/30: 46.9% 1/25: 46.99%; 07/27/2020 FOTO 42% 4/21: 47%    Time 12    Period Weeks    Status Partially Met    Target Date 11/18/20      PT LONG TERM GOAL #2   Title Patient (< 38 years old) will complete five times sit to stand test in < 20 seconds indicating an increased LE strength and improved balance.    Baseline 7/27: 26.8 seconds with one partial stand 6/22: 40. 86 sec; unable to reach full stand last two attempts 5/19: 42 seconds 10/5: BUE reliance, patient unable to complete 4 full stands. Attempt next session 11/2: 23.71 seconds 12/30: 30.32 with one near LOB due to spasms 1/25: 27.11 seconds with one hand on RW one hand on chair; 37.5 seconds with use of BUEs 4/21: 28.2 seconds    Time 12    Period Weeks    Status Partially Met    Target Date 11/18/20      PT LONG TERM GOAL #3   Title Patient will increase 10 meter walk test to <20 seconds as to improve gait speed for better community ambulation and to reduce fall risk.    Baseline 8/31: 35.1 seconds with RW 7/27: 36 seconds with RW  6/22: 36.1 seconds 5/19: 42 seconds 10/5: 31.9 seconds with RW 11/2: 29.9 seconds with RW 12/30: deferred due to spasm 1/25: defer to next session 1/27: 29 seconds with RW; 07/27/2020 deferred; 07/29/2020 41.5 sec (0.24 m/s) 4/21: 32.3 seconds with RW    Time 12    Period Weeks    Status Partially Met    Target Date 11/18/20      PT LONG TERM GOAL #4   Title Patient will reduce timed up and go to <11 seconds to reduce fall risk and demonstrate improved transfer/gait ability.    Baseline 8/31: 50.8 seconds with RW 7/27: 43.65 6/22: 45.4 seconds 5/19: 47 seconds 10/5: 38.5 seconds with RW 11/2: 35.72 seconds with RW 12/30: 43.5 seconds with wheels wet. 1/25: deferred 1/27: 38.9 seconds; 07/27/2020 82 seconds with RW, CGA; 07/29/2020 44 seconds 4/21: 38 seconds with RW    Time 12    Period Weeks    Status Partially Met    Target Date 11/18/20      PT LONG TERM GOAL #5   Title Patient will ambulate from physical therapy gym to elevator down the hall, ride elevator to first floor, and walk to valet with RW and chair follow for increased capacity for mobility and ind.    Baseline 8/31: able to open door ~50 ft with one rest break 8/10: unable to ambulate to elevator without rest break 9/30: able to ambulate to elevator with 3 rest breaks 1/25: 181 ft with 2 seated rest break (to elevator); 07/27/2020 deferred 4/21 able to perform with one seated rest break to elevator unable to get into elevator yet    Time 12    Period Weeks    Status Partially Met    Target Date 11/18/20      PT LONG TERM GOAL #6   Title Patient will be able to stand with RW and lift one leg onto 4" step for carryover to  negotiation of steps at home.    Baseline 11/2: unable to perform 1/25: R foot 0.75 inch lift; 07/27/2020 heel not fully off ground, but pt slightly able to lift R LE to tap side of step while holding onto RW with both hands 4/21: able to clear foot ~3/4 inch with RLE    Time 12    Period Weeks    Status Partially  Met    Target Date 11/18/20                 Plan - 09/30/20 1330    Clinical Impression Statement Patient is highly motivated and able to negotiate crowded front lobby of hospital with walking as well as wait for elevator with people rapidly walking past. Ambulation terminated earlier due to fatigue resulting in hold body trembling with patient making it to restrooms on lower level rather than down the hall to the rehab gym. The patient would benefit from continued skilled PT intervention to maximize QOL, safety, and function    Personal Factors and Comorbidities Age;Comorbidity 3+;Fitness;Past/Current Experience;Sex;Time since onset of injury/illness/exacerbation    Comorbidities HTN, SVT, neurogenic bowel, MS (1995), neuropathy, hx of wrist fx, HPV, Hypercholesteremia, adiposity, osteopenia, spasticity.    Examination-Activity Limitations Bathing;Bed Mobility;Bend;Carry;Continence;Dressing;Hygiene/Grooming;Stairs;Squat;Reach Overhead;Locomotion Level;Lift;Stand;Transfers    Examination-Participation Restrictions Church;Cleaning;Community Activity;Laundry;Volunteer;Shop;Meal Prep;Yard Work    [employer] Evolving/Moderate complexity    Rehab Potential Good    PT Frequency 2x / week    PT Duration 12 weeks    PT Treatment/Interventions ADLs/Self Care Home Management;Aquatic Therapy;Biofeedback;Cryotherapy;Electrical Stimulation;Iontophoresis 70m/ml Dexamethasone;Traction;Moist Heat;Ultrasound;DME Instruction;Gait training;Stair training;Functional mobility training;Therapeutic activities;Therapeutic exercise;Balance training;Neuromuscular re-education;Patient/family education;Orthotic Fit/Training;Wheelchair mobility training;Manual techniques;Compression bandaging;Passive range of motion;Dry needling;Energy conservation;Taping;Splinting;Vestibular    PT Next Visit Plan step negotiation, turning directions to the L and R; re-test goals    PT Home Exercise Plan maintain  [place]gree with Plan of Care Patient           Patient will benefit from skilled therapeutic intervention in order to improve the following deficits and impairments:  Abnormal gait,Decreased activity tolerance,Decreased balance,Decreased coordination,Decreased endurance,Decreased mobility,Decreased range of motion,Decreased strength,Difficulty walking,Impaired perceived functional ability,Impaired sensation,Impaired tone,Impaired UE functional use,Improper body mechanics,Postural dysfunction,Cardiopulmonary status limiting activity,Impaired flexibility,Increased muscle spasms  Visit Diagnosis: Muscle weakness (generalized)  Other abnormalities of gait and mobility  Unsteadiness on feet  Multiple sclerosis exacerbation ([place]     Problem List Patient Active Problem List   Diagnosis Date Noted  . Hypoalbuminemia due to protein-calorie malnutrition ([place]   . Neurogenic bowel   . Neurogenic bladder   . Labile blood pressure   . Neuropathic pain   . Abscess of female pelvis   . SVT (supraventricular tachycardia) ([place]   . Radial styloid tenosynovitis 03/12/2018  . Wheelchair confinement 02/27/2018  . Localized osteoporosis with current pathological fracture with routine healing 01/19/2017  . Wrist fracture 01/16/2017  . Sprain of ankle 03/23/2016  . Closed fracture of lateral malleolus 03/16/2016  . Health care maintenance 01/24/2016  . Blood pressure elevated without history of HTN 10/25/2015  . Essential hypertension 10/25/2015  . Multiple sclerosis ([place] 10/02/2015  . Chronic left shoulder pain 07/19/2015  . Multiple sclerosis exacerbation ([place] 07/14/2015  . MS (multiple sclerosis) ([place] 11/26/2014  . Increased body mass index 11/26/2014  . HPV test positive 11/26/2014  . Status post laparoscopic supracervical hysterectomy 11/26/2014  . Galactorrhea 11/26/2014  . Back ache 05/21/2014  . Adiposity 05/21/2014  . Disordered sleep  05/21/2014  .  Muscle spasticity 05/21/2014  . Spasticity 05/21/2014  . Calculus of kidney 12/09/2013  . Renal colic 00/41/5930  . Hypercholesteremia 08/19/2013  . Hereditary and idiopathic neuropathy 08/19/2013  . Hypercholesterolemia without hypertriglyceridemia 08/19/2013  . Bladder infection, chronic 07/25/2012  . Disorder of bladder function 07/25/2012  . Incomplete bladder emptying 07/25/2012  . Microscopic hematuria 07/25/2012  . Right upper quadrant pain 07/25/2012   Janna Arch, PT, DPT   09/30/2020, 1:31 PM  [place] MAIN [facility] SERVICES [address] [place], [place], [address] Phone: [phone]   Fax:  [phone]  Name: Deloria Brassfield Banos MRN: [id number] Date of Birth: 11-05-1960

## 2020-10-05 ENCOUNTER — Ambulatory Visit: Payer: PPO

## 2020-10-05 ENCOUNTER — Encounter: Payer: Self-pay | Admitting: Urology

## 2020-10-05 ENCOUNTER — Other Ambulatory Visit: Payer: Self-pay

## 2020-10-05 ENCOUNTER — Ambulatory Visit (INDEPENDENT_AMBULATORY_CARE_PROVIDER_SITE_OTHER): Payer: PPO | Admitting: Urology

## 2020-10-05 VITALS — BP 164/94 | HR 88 | Ht 71.0 in | Wt 250.0 lb

## 2020-10-05 DIAGNOSIS — R300 Dysuria: Secondary | ICD-10-CM | POA: Diagnosis not present

## 2020-10-05 DIAGNOSIS — M6281 Muscle weakness (generalized): Secondary | ICD-10-CM

## 2020-10-05 DIAGNOSIS — R2681 Unsteadiness on feet: Secondary | ICD-10-CM

## 2020-10-05 DIAGNOSIS — R2689 Other abnormalities of gait and mobility: Secondary | ICD-10-CM

## 2020-10-05 NOTE — Therapy (Signed)
[place] MAIN [facility] SERVICES [address] [place], [place], [address] Phone: [phone]   Fax:  [phone]  Physical Therapy Treatment  Patient Details  Name: Lisa Williams MRN: [id number] Date of Birth: 01/13/1961 No data recorded  Encounter Date: 10/05/2020   PT End of Session - 10/05/20 1005    Visit Number 98    Number of Visits 100    Date for PT Re-Evaluation 11/18/20    Authorization Type 8/10 PN 5/3    PT Start Time 1015    PT Stop Time 1059    PT Time Calculation (min) 44 min    Equipment Utilized During Treatment Gait belt;Other (comment)    Activity Tolerance Patient tolerated treatment well;Patient limited by fatigue    Behavior During Therapy [facility] for tasks assessed/performed           Past Medical History:  Diagnosis Date  . Abdominal pain, right upper quadrant   . Back pain   . Calculus of kidney 12/09/2013  . Chronic back pain    unspecified  . Chronic left shoulder pain 07/19/2015  . Complication of anesthesia   . Functional disorder of bladder    other  . Galactorrhea 11/26/2014   Chronic   . Hereditary and idiopathic neuropathy 08/19/2013  . HPV test positive   . Hypercholesteremia 08/19/2013  . Hypertension   . Incomplete bladder emptying   . Microscopic hematuria   . MS (multiple sclerosis) ([place])   . Muscle spasticity 05/21/2014  . Nonspecific findings on examination of urine    other  . Osteopenia   . PONV (postoperative nausea and vomiting)   . Status post laparoscopic supracervical hysterectomy 11/26/2014  . Tobacco user 11/26/2014  . Wrist fracture     Past Surgical History:  Procedure Laterality Date  . bilateral tubal ligation  1996  . BREAST CYST EXCISION Left 2002  . FRACTURE SURGERY    . KNEE SURGERY     right  . LAPAROSCOPIC SUPRACERVICAL HYSTERECTOMY  08/05/2013  . ORIF WRIST FRACTURE Left 01/17/2017   Procedure: OPEN REDUCTION INTERNAL FIXATION (ORIF) WRIST FRACTURE;  Surgeon:  Lovell Sheehan, MD;  Location: [facility] ORS;  Service: Orthopedics;  Laterality: Left;  . RADIOLOGY WITH ANESTHESIA N/A 03/18/2020   Procedure: MRI WITH ANESTHESIA CERVICAL SPINE AND BRAIN  WITH AND WITHOUT CONTRAST;  Surgeon: Radiologist, Medication, MD;  Location: [place];  Service: Radiology;  Laterality: N/A;  . TUBAL LIGATION Bilateral   . VAGINAL HYSTERECTOMY  03/2006    There were no vitals filed for this visit.   Subjective Assessment - 10/05/20 1226    Subjective Patient reports she is worried she may have a UTI. Is going to the doctor this afternoon.    Pertinent History Patient is previous patient of this therapist, was admitted to the hospital on 08/03/19 for MS exacerbation and then went to inpatient rehab on 08/08/19. Patient then received home health therapy and now is returning to outpatient PT. PMh includes HTN, SVT, neurogenic bowel, MS (1995), neuropathy, hx of wrist fx, HPV, Hypercholesteremia, adiposity, osteopenia, spasticity. She wears bilateral AFOs and uses a RW and/or wheelchair for mobility. She drives an adapted car.    Limitations Lifting;Standing;Walking;House hold activities    How long can you sit comfortably? n/a    How long can you stand comfortably? 4 minutes    How long can you walk comfortably? walked 50 ft in inpatient rehab    Patient Stated Goals return to PLOF,  increase walking, get into shower, increase mobility    Currently in Pain? No/denies                   Standing in // bars:  -Standing weight shifts 2x10 each side; UE support focus on hip shift rather than pull with arm -standing terminated due to excessive fatigue and trembling.   Seated:  Hamstring isometric on dynadisc 10x 3 second holds each LE  Hamstring stretch on PT leg 2x30 seconds; x 2 sets  Adduction ball squeeze 15x AAROM LAQ 10x each LE  GTB: row 15x GTB abduction around bilateral LE 10x GRB straight arm flexion with abduction 10x GTB ER 10x Weighted ball (2000 gr) -chest  press 20x,  -woodchop D1 10x, woodchop D1 10x   -overhead raise 10x -straight arm raise 10x  Ice pack donned throughout session to minimize exacerbation of MS symptoms in order to perform interventions.      Pt educated throughout session about proper posture and technique with exercises. Improved exercise technique, movement at target joints, use of target muscles after min to mod verbal, visual, tactile cues   Patient's session is limited due to patient fatigue and increased trembling. Patient will go to physician this afternoon to test for a UTI. Patient challenged with prolonged muscle recruitment initially with noticeable trembles throughout entire body limiting session to seated position. The patient would benefit from continued skilled PT intervention to maximize QOL, safety, and function                        PT Education - 10/05/20 1004    Education provided Yes    Education Details exercise technique, body mechanics    Person(s) Educated Patient    Methods Explanation;Demonstration;Tactile cues;Verbal cues    Comprehension Verbalized understanding;Returned demonstration;Verbal cues required;Tactile cues required            PT Short Term Goals - 08/26/20 1115      PT SHORT TERM GOAL #1   Title Patient will be independent in home exercise program to improve strength/mobility for better functional independence with ADLs.    Baseline 5/19: HEP next session 6/22: HEP compliant 8/10 : HEP progressions compliant 8/31 HEP compliant 10/5: HEP compliant    Time 4    Period Weeks    Status Achieved    Target Date 01/13/20      PT SHORT TERM GOAL #2   Title Patient will perform a sit to stand with SUE support for increased ind. with transfers.    Baseline 5/19: heavy BUE support 6/22: requires one hand on walker one hand on w/c 8/10: able to perform intermittently (some days can some days cannot) 8/31: attempt next session 10/5: BUE reliance, patient unable to  complete 4 full stands. Attempt next session 1/25: able to perform from elevated surface only; 3/22 pt pushes through L arm off of wheelchair with R arm on RW for tactile cue/balance to perform transfer 4/21: requires use of UE's    Time 4    Period Weeks    Status On-going    Target Date 09/23/20             PT Long Term Goals - 08/26/20 0001      PT LONG TERM GOAL #1   Title Patient will increase FOTO score to equal to or greater than  53/100   to demonstrate statistically significant improvement in mobility and quality of life.    Baseline  8/31: 48.13% 7/27:  48.1% 6/22: 46.99% 5/18: 48/100 10/5: 49/100 11/2: 48%  12/30: 46.9% 1/25: 46.99%; 07/27/2020 FOTO 42% 4/21: 47%    Time 12    Period Weeks    Status Partially Met    Target Date 11/18/20      PT LONG TERM GOAL #2   Title Patient (< 91 years old) will complete five times sit to stand test in < 20 seconds indicating an increased LE strength and improved balance.    Baseline 7/27: 26.8 seconds with one partial stand 6/22: 40. 86 sec; unable to reach full stand last two attempts 5/19: 42 seconds 10/5: BUE reliance, patient unable to complete 4 full stands. Attempt next session 11/2: 23.71 seconds 12/30: 30.32 with one near LOB due to spasms 1/25: 27.11 seconds with one hand on RW one hand on chair; 37.5 seconds with use of BUEs 4/21: 28.2 seconds    Time 12    Period Weeks    Status Partially Met    Target Date 11/18/20      PT LONG TERM GOAL #3   Title Patient will increase 10 meter walk test to <20 seconds as to improve gait speed for better community ambulation and to reduce fall risk.    Baseline 8/31: 35.1 seconds with RW 7/27: 36 seconds with RW 6/22: 36.1 seconds 5/19: 42 seconds 10/5: 31.9 seconds with RW 11/2: 29.9 seconds with RW 12/30: deferred due to spasm 1/25: defer to next session 1/27: 29 seconds with RW; 07/27/2020 deferred; 07/29/2020 41.5 sec (0.24 m/s) 4/21: 32.3 seconds with RW    Time 12    Period Weeks     Status Partially Met    Target Date 11/18/20      PT LONG TERM GOAL #4   Title Patient will reduce timed up and go to <11 seconds to reduce fall risk and demonstrate improved transfer/gait ability.    Baseline 8/31: 50.8 seconds with RW 7/27: 43.65 6/22: 45.4 seconds 5/19: 47 seconds 10/5: 38.5 seconds with RW 11/2: 35.72 seconds with RW 12/30: 43.5 seconds with wheels wet. 1/25: deferred 1/27: 38.9 seconds; 07/27/2020 82 seconds with RW, CGA; 07/29/2020 44 seconds 4/21: 38 seconds with RW    Time 12    Period Weeks    Status Partially Met    Target Date 11/18/20      PT LONG TERM GOAL #5   Title Patient will ambulate from physical therapy gym to elevator down the hall, ride elevator to first floor, and walk to valet with RW and chair follow for increased capacity for mobility and ind.    Baseline 8/31: able to open door ~50 ft with one rest break 8/10: unable to ambulate to elevator without rest break 9/30: able to ambulate to elevator with 3 rest breaks 1/25: 181 ft with 2 seated rest break (to elevator); 07/27/2020 deferred 4/21 able to perform with one seated rest break to elevator unable to get into elevator yet    Time 12    Period Weeks    Status Partially Met    Target Date 11/18/20      PT LONG TERM GOAL #6   Title Patient will be able to stand with RW and lift one leg onto 4" step for carryover to negotiation of steps at home.    Baseline 11/2: unable to perform 1/25: R foot 0.75 inch lift; 07/27/2020 heel not fully off ground, but pt slightly able to lift R LE to tap side of step  while holding onto RW with both hands 4/21: able to clear foot ~3/4 inch with RLE    Time 12    Period Weeks    Status Partially Met    Target Date 11/18/20                 Plan - 10/05/20 1227    Clinical Impression Statement Patient's session is limited due to patient fatigue and increased trembling. Patient will go to physician this afternoon to test for a UTI. Patient challenged with prolonged  muscle recruitment initially with noticeable trembles throughout entire body limiting session to seated position. The patient would benefit from continued skilled PT intervention to maximize QOL, safety, and function    Personal Factors and Comorbidities Age;Comorbidity 3+;Fitness;Past/Current Experience;Sex;Time since onset of injury/illness/exacerbation    Comorbidities HTN, SVT, neurogenic bowel, MS (1995), neuropathy, hx of wrist fx, HPV, Hypercholesteremia, adiposity, osteopenia, spasticity.    Examination-Activity Limitations Bathing;Bed Mobility;Bend;Carry;Continence;Dressing;Hygiene/Grooming;Stairs;Squat;Reach Overhead;Locomotion Level;Lift;Stand;Transfers    Examination-Participation Restrictions Church;Cleaning;Community Activity;Laundry;Volunteer;Shop;Meal Prep;Yard Work    [employer] Evolving/Moderate complexity    Rehab Potential Good    PT Frequency 2x / week    PT Duration 12 weeks    PT Treatment/Interventions ADLs/Self Care Home Management;Aquatic Therapy;Biofeedback;Cryotherapy;Electrical Stimulation;Iontophoresis 15m/ml Dexamethasone;Traction;Moist Heat;Ultrasound;DME Instruction;Gait training;Stair training;Functional mobility training;Therapeutic activities;Therapeutic exercise;Balance training;Neuromuscular re-education;Patient/family education;Orthotic Fit/Training;Wheelchair mobility training;Manual techniques;Compression bandaging;Passive range of motion;Dry needling;Energy conservation;Taping;Splinting;Vestibular    PT Next Visit Plan step negotiation, turning directions to the L and R; re-test goals    PT Home Exercise Plan [place] Agree with Plan of Care Patient           Patient will benefit from skilled therapeutic intervention in order to improve the following deficits and impairments:  Abnormal gait,Decreased activity tolerance,Decreased balance,Decreased coordination,Decreased endurance,Decreased  mobility,Decreased range of motion,Decreased strength,Difficulty walking,Impaired perceived functional ability,Impaired sensation,Impaired tone,Impaired UE functional use,Improper body mechanics,Postural dysfunction,Cardiopulmonary status limiting activity,Impaired flexibility,Increased muscle spasms  Visit Diagnosis: Muscle weakness (generalized)  Other abnormalities of gait and mobility  Unsteadiness on feet     Problem List Patient Active Problem List   Diagnosis Date Noted  . Hypoalbuminemia due to protein-calorie malnutrition ([place]   . Neurogenic bowel   . Neurogenic bladder   . Labile blood pressure   . Neuropathic pain   . Abscess of female pelvis   . SVT (supra[place]ricular tachycardia) ([place]   . Radial styloid tenosynovitis 03/12/2018  . Wheelchair confinement 02/27/2018  . Localized osteoporosis with current pathological fracture with routine healing 01/19/2017  . Wrist fracture 01/16/2017  . Sprain of ankle 03/23/2016  . Closed fracture of lateral malleolus 03/16/2016  . Health care maintenance 01/24/2016  . Blood pressure elevated without history of HTN 10/25/2015  . Essential hypertension 10/25/2015  . Multiple sclerosis ([place] 10/02/2015  . Chronic left shoulder pain 07/19/2015  . Multiple sclerosis exacerbation ([place] 07/14/2015  . MS (multiple sclerosis) ([place] 11/26/2014  . Increased body mass index 11/26/2014  . HPV test positive 11/26/2014  . Status post laparoscopic supracervical hysterectomy 11/26/2014  . Galactorrhea 11/26/2014  . Back ache 05/21/2014  . Adiposity 05/21/2014  . Disordered sleep 05/21/2014  . Muscle spasticity 05/21/2014  . Spasticity 05/21/2014  . Calculus of kidney 12/09/2013  . Renal colic 069/[id number]/9528 . Hypercholesteremia 08/19/2013  . Hereditary and idiopathic neuropathy 08/19/2013  . Hypercholesterolemia without hypertriglyceridemia 08/19/2013  . Bladder infection, chronic 07/25/2012  . Disorder of bladder function 07/25/2012  .  Incomplete bladder emptying 07/25/2012  . Microscopic  hematuria 07/25/2012  . Right upper quadrant pain 07/25/2012   Janna Arch, PT, DPT   10/05/2020, 12:28 PM  [place] MAIN [facility] SERVICES [address] [place], [place], [address] Phone: [phone]   Fax:  [phone]  Name: Purity Irmen Feher MRN: [id number] Date of Birth: [birth year]

## 2020-10-05 NOTE — Progress Notes (Signed)
10/05/2020 2:49 PM   Lisa Williams 05-20-1960 [id number]  Referring provider: Lauro Regulus, MD [address] [facility] [place] [facility] [place],  [place] [address]  Chief Complaint  Patient presents with  . Dysuria   Urological history: 1. OAB -contributing factors of age, MS and obesity -PVR 80 mL -managed with oxybutynin XL 10 mg daily    HPI: Lisa Williams is a 60 y.o. female who presents today for burning with urination.  She started to experience a slight burning in the vaginal area which she felt may have been the result of using a different soap, but she wanted to make sure she did not have an UTI.  She increased her cranberry juice and Activa yogurt   Her symptoms have been improving over the last few days.  UA is bland.   PVR 80 mL.    PMH: Past Medical History:  Diagnosis Date  . Abdominal pain, right upper quadrant   . Back pain   . Calculus of kidney 12/09/2013  . Chronic back pain    unspecified  . Chronic left shoulder pain 07/19/2015  . Complication of anesthesia   . Functional disorder of bladder    other  . Galactorrhea 11/26/2014   Chronic   . Hereditary and idiopathic neuropathy 08/19/2013  . HPV test positive   . Hypercholesteremia 08/19/2013  . Hypertension   . Incomplete bladder emptying   . Microscopic hematuria   . MS (multiple sclerosis) ([facility])   . Muscle spasticity 05/21/2014  . Nonspecific findings on examination of urine    other  . Osteopenia   . PONV (postoperative nausea and vomiting)   . Status post laparoscopic supracervical hysterectomy 11/26/2014  . Tobacco user 11/26/2014  . Wrist fracture     Surgical History: Past Surgical History:  Procedure Laterality Date  . bilateral tubal ligation  1996  . BREAST CYST EXCISION Left 2002  . FRACTURE SURGERY    . KNEE SURGERY     right  . LAPAROSCOPIC SUPRACERVICAL HYSTERECTOMY  08/05/2013  . ORIF WRIST FRACTURE Left 01/17/2017   Procedure: OPEN  REDUCTION INTERNAL FIXATION (ORIF) WRIST FRACTURE;  Surgeon: Lyndle Herrlich, MD;  Location: [facility] ORS;  Service: Orthopedics;  Laterality: Left;  . RADIOLOGY WITH ANESTHESIA N/A 03/18/2020   Procedure: MRI WITH ANESTHESIA CERVICAL SPINE AND BRAIN  WITH AND WITHOUT CONTRAST;  Surgeon: Radiologist, Medication, MD;  Location: [facility];  Service: Radiology;  Laterality: N/A;  . TUBAL LIGATION Bilateral   . VAGINAL HYSTERECTOMY  03/2006    Home Medications:  Allergies as of 10/05/2020      Reactions   Amlodipine Swelling   Povidone Iodine Rash      Medication List       Accurate as of Oct 05, 2020  2:49 PM. If you have any questions, ask your nurse or doctor.        acetaminophen 500 MG tablet Commonly known as: TYLENOL Take 500-1,000 mg by mouth every 6 (six) hours as needed for moderate pain or headache.   baclofen 20 MG tablet Commonly known as: LIORESAL Take 1 tablet (20 mg total) by mouth 3 (three) times daily.   Cholecalciferol 25 MCG (1000 UT) tablet Take 2,000 Units by mouth daily.   gabapentin 100 MG capsule Commonly known as: NEURONTIN Take 1 capsule (100 mg total) by mouth 2 (two) times daily.   ibandronate 150 MG tablet Commonly known as: BONIVA Take 150 mg by mouth every 30 (  thirty) days.   Mayzent 2 MG Tabs Generic drug: Siponimod Fumarate Take 2 mg by mouth daily.   oxybutynin 10 MG 24 hr tablet Commonly known as: DITROPAN-XL Take 1 tablet (10 mg total) by mouth daily.   predniSONE 5 MG tablet Commonly known as: DELTASONE Begin taking 6 tablets daily, taper by one tablet every other day until off the medication.   sulfamethoxazole-trimethoprim 800-160 MG tablet Commonly known as: BACTRIM DS Take 1 tablet by mouth 2 (two) times daily.   telmisartan 40 MG tablet Commonly known as: MICARDIS Take 40 mg by mouth daily.   tiZANidine 2 MG tablet Commonly known as: ZANAFLEX TAKE 1 TABLET(2 MG) BY MOUTH THREE TIMES DAILY       Allergies:  Allergies   Allergen Reactions  . Amlodipine Swelling  . Povidone Iodine Rash    Family History: Family History  Problem Relation Age of Onset  . Sickle cell trait Sister   . Hypertension Sister   . Supraventricular tachycardia Sister   . Hypertension Sister   . Diabetes Maternal Grandmother   . Liver cancer Maternal Grandmother   . Diabetes Maternal Aunt   . Breast cancer Maternal Aunt        great MAT  . Ovarian cancer Paternal Grandmother   . Nephrolithiasis Father   . Hypertension Father   . Prostate cancer Father   . Kidney disease Father   . Heart failure Father   . Hypertension Sister   . Osteoarthritis Mother   . Hypertension Mother   . Hyperlipidemia Mother   . GU problems Neg Hx   . Urolithiasis Neg Hx     Social History:  reports that she has quit smoking. Her smoking use included cigarettes. She smoked 0.50 packs per day. She has never used smokeless tobacco. She reports that she does not drink alcohol and does not use drugs.  ROS: Pertinent ROS in HPI  Physical Exam: BP (!) 164/94   Pulse 88   Ht 5\' 11"  (1.803 m)   Wt 250 lb (113.4 kg)   BMI 34.87 kg/m   Constitutional:  Well nourished. Alert and oriented, No acute distress. HEENT: [employer] AT, mask in place.  Trachea midline Cardiovascular: No clubbing, cyanosis, or edema. Respiratory: Normal respiratory effort, no increased work of breathing. Neurologic: Grossly intact, no focal deficits, moving all 4 extremities. Psychiatric: Normal mood and affect.   Laboratory Data: Lab Results  Component Value Date   WBC 5.2 08/09/2020   HGB 12.7 08/09/2020   HCT 38.1 08/09/2020   MCV 78 (L) 08/09/2020   PLT 235 08/09/2020    Lab Results  Component Value Date   CREATININE 0.68 08/09/2020    Lab Results  Component Value Date   TSH 7.750 (H) 05/13/2020    Lab Results  Component Value Date   AST 24 08/09/2020   Lab Results  Component Value Date   ALT 45 (H) 08/09/2020    Urinalysis Component     Latest  Ref Rng & Units 10/05/2020  Specific Gravity, UA     1.005 - 1.030 1.025  pH, UA     5.0 - 7.5 5.0  Color, UA     Yellow Orange  Appearance Ur     Clear Hazy (A)  Leukocytes,UA     Negative Negative  Protein,UA     Negative/Trace Negative  Glucose, UA     Negative Negative  Ketones, UA     Negative Negative  RBC, UA  Negative Negative  Bilirubin, UA     Negative Negative  Urobilinogen, Ur     0.2 - 1.0 mg/dL 1.0  Nitrite, UA     Negative Negative  Microscopic Examination      See below:   Component     Latest Ref Rng & Units 10/05/2020          WBC, UA     0 - 5 /hpf 0-5  RBC     0 - 2 /hpf 0-2  Epithelial Cells (non renal)     0 - 10 /hpf None seen  Casts     None seen /lpf Present (A)  Cast Type     N/A Hyaline casts  Bacteria, UA     None seen/Few None seen  I have reviewed the labs.   Assessment & Plan:   1. Dysuria -UA -urine culture -We discussed that as her symptoms are improving to hold on prescribing any antibiotic at this time -If urine culture is negative or symptoms do not abate over time with antibiotic, we may need to refer to gynecology as she describes a more vaginal burning  2. OAB -continue oxybutynin   Return for Pending urine culture results .  These notes generated with voice recognition software. I apologize for typographical errors.  Michiel Cowboy, PA-C  [facility] [address]  [address] [place], [place] [address] [phone]

## 2020-10-05 NOTE — Progress Notes (Signed)
In and Out Catheterization  Patient is present today for a I & O catheterization due to dysuria. Patient was cleaned and prepped in a sterile fashion with Hybaclens . A 14FR cath was inserted no complications were noted , 65ml of urine return was noted, urine was orange in color. A clean urine sample was collected for UA,UCX. Bladder was drained  And catheter was removed with out difficulty.    Preformed by: Teressa Lower, CMA

## 2020-10-06 LAB — URINALYSIS, COMPLETE
Bilirubin, UA: NEGATIVE
Glucose, UA: NEGATIVE
Ketones, UA: NEGATIVE
Leukocytes,UA: NEGATIVE
Nitrite, UA: NEGATIVE
Protein,UA: NEGATIVE
RBC, UA: NEGATIVE
Specific Gravity, UA: 1.025 (ref 1.005–1.030)
Urobilinogen, Ur: 1 mg/dL (ref 0.2–1.0)
pH, UA: 5 (ref 5.0–7.5)

## 2020-10-06 LAB — MICROSCOPIC EXAMINATION
Bacteria, UA: NONE SEEN
Epithelial Cells (non renal): NONE SEEN /hpf (ref 0–10)

## 2020-10-07 ENCOUNTER — Ambulatory Visit: Payer: PPO | Attending: Neurology

## 2020-10-07 ENCOUNTER — Other Ambulatory Visit: Payer: Self-pay

## 2020-10-07 DIAGNOSIS — R2681 Unsteadiness on feet: Secondary | ICD-10-CM | POA: Diagnosis not present

## 2020-10-07 DIAGNOSIS — R2689 Other abnormalities of gait and mobility: Secondary | ICD-10-CM | POA: Insufficient documentation

## 2020-10-07 DIAGNOSIS — M6281 Muscle weakness (generalized): Secondary | ICD-10-CM | POA: Diagnosis not present

## 2020-10-07 DIAGNOSIS — G35 Multiple sclerosis: Secondary | ICD-10-CM | POA: Diagnosis not present

## 2020-10-07 NOTE — Therapy (Signed)
[place] MAIN [facility] SERVICES [address] [place], [place], [address] Phone: [phone]   Fax:  [phone]  Physical Therapy Treatment  Patient Details  Name: Lisa Williams MRN: [id number] Date of Birth: 03/16/1961 No data recorded  Encounter Date: 10/07/2020   PT End of Session - 10/07/20 1243    Visit Number 99    Number of Visits 100    Date for PT Re-Evaluation 11/18/20    Authorization Type 9/10 PN 5/3    PT Start Time 1017    PT Stop Time 1100    PT Time Calculation (min) 43 min    Equipment Utilized During Treatment Gait belt;Other (comment)    Activity Tolerance Patient tolerated treatment well;Patient limited by fatigue    Behavior During Therapy [facility] for tasks assessed/performed           Past Medical History:  Diagnosis Date  . Abdominal pain, right upper quadrant   . Back pain   . Calculus of kidney 12/09/2013  . Chronic back pain    unspecified  . Chronic left shoulder pain 07/19/2015  . Complication of anesthesia   . Functional disorder of bladder    other  . Galactorrhea 11/26/2014   Chronic   . Hereditary and idiopathic neuropathy 08/19/2013  . HPV test positive   . Hypercholesteremia 08/19/2013  . Hypertension   . Incomplete bladder emptying   . Microscopic hematuria   . MS (multiple sclerosis) ([place])   . Muscle spasticity 05/21/2014  . Nonspecific findings on examination of urine    other  . Osteopenia   . PONV (postoperative nausea and vomiting)   . Status post laparoscopic supracervical hysterectomy 11/26/2014  . Tobacco user 11/26/2014  . Wrist fracture     Past Surgical History:  Procedure Laterality Date  . bilateral tubal ligation  1996  . BREAST CYST EXCISION Left 2002  . FRACTURE SURGERY    . KNEE SURGERY     right  . LAPAROSCOPIC SUPRACERVICAL HYSTERECTOMY  08/05/2013  . ORIF WRIST FRACTURE Left 01/17/2017   Procedure: OPEN REDUCTION INTERNAL FIXATION (ORIF) WRIST FRACTURE;  Surgeon:  Lovell Sheehan, MD;  Location: [facility] OR[facility];  Service: Orthopedics;  Laterality: Left;  . RADIOLOGY WITH ANESTHESIA N/A 03/18/2020   Procedure: MRI WITH ANESTHESIA CERVICAL SPINE AND BRAIN  WITH AND WITHOUT CONTRAST;  Surgeon: Radiologist, Medication, MD;  Location: [facility];  Service: Radiology;  Laterality: N/A;  . TUBAL LIGATION Bilateral   . VAGINAL HYSTERECTOMY  03/2006    There were no vitals filed for this visit.   Subjective Assessment - 10/07/20 1242    Subjective Patient reports she is waiting to hear from the doc about if she has a UTI or not. Has her neck freezer brace due to the higher temps.    Pertinent History Patient is previous patient of this therapist, was admitted to the hospital on 08/03/19 for MS exacerbation and then went to inpatient rehab on 08/08/19. Patient then received home health therapy and now is returning to outpatient PT. PMh includes HTN, SVT, neurogenic bowel, MS (1995), neuropathy, hx of wrist fx, HPV, Hypercholesteremia, adiposity, osteopenia, spasticity. She wears bilateral AFOs and uses a RW and/or wheelchair for mobility. She drives an adapted car.    Limitations Lifting;Standing;Walking;House hold activities    How long can you sit comfortably? n/a    How long can you stand comfortably? 4 minutes    How long can you walk comfortably? walked 50 ft in  inpatient rehab    Patient Stated Goals return to PLOF, increase walking, get into shower, increase mobility    Currently in Pain? No/denies                  Standing with RW:  Negotiation of three cones weaving between with RW and CGA x4 trials with rest break in the middle.   Sit to stand: terminated after fatigue resulting in buckling of limbs.      Seated:  Seated boxing: cues for sequencing, body mechanics, and pace/rhythm for functional contraction and timing of muscle recruitment: -Cross body punches to mitts on PT hands with no back support for focused core stabilization with crossing midline  2x 60 seconds  -Cross body punch with secondary combination of elbow for full cross body rotation to PT mitt for trunk stability, coordination, and cardiovascular challenge x 60 seconds -Upper cut to mitts in PT hands for core activation without back support with perturbations 60 seconds  Ice pack donned throughout session to minimize exacerbation of MS symptoms in order to perform interventions.      Pt educated throughout session about proper posture and technique with exercises. Improved exercise technique, movement at target joints, use of target muscles after min to mod verbal, visual, tactile cues     Increased rest breaks required throughout physical therapy session due to increased temperatures resulting in diffuse fatigue throughout body. She is highly motivated despite the fatigue and is able to safely negotiate cones without knocking a cone over, crossing her limbs, or losing her balance. The patient would benefit from continued skilled PT intervention to maximize QOL, safety, and function                        PT Education - 10/07/20 1242    Education provided Yes    Education Details exercise technique, object negotiation    Person(s) Educated Patient    Methods Explanation;Demonstration;Tactile cues;Verbal cues    Comprehension Verbalized understanding;Returned demonstration;Verbal cues required;Tactile cues required            PT Short Term Goals - 08/26/20 1115      PT SHORT TERM GOAL #1   Title Patient will be independent in home exercise program to improve strength/mobility for better functional independence with ADLs.    Baseline 5/19: HEP next session 6/22: HEP compliant 8/10 : HEP progressions compliant 8/31 HEP compliant 10/5: HEP compliant    Time 4    Period Weeks    Status Achieved    Target Date 01/13/20      PT SHORT TERM GOAL #2   Title Patient will perform a sit to stand with SUE support for increased ind. with transfers.    Baseline  5/19: heavy BUE support 6/22: requires one hand on walker one hand on w/c 8/10: able to perform intermittently (some days can some days cannot) 8/31: attempt next session 10/5: BUE reliance, patient unable to complete 4 full stands. Attempt next session 1/25: able to perform from elevated surface only; 3/22 pt pushes through L arm off of wheelchair with R arm on RW for tactile cue/balance to perform transfer 4/21: requires use of UE's    Time 4    Period Weeks    Status On-going    Target Date 09/23/20             PT Long Term Goals - 08/26/20 0001      PT LONG TERM GOAL #1     Title Patient will increase FOTO score to equal to or greater than  53/100   to demonstrate statistically significant improvement in mobility and quality of life.    Baseline 8/31: 48.13% 7/27:  48.1% 6/22: 46.99% 5/18: 48/100 10/5: 49/100 11/2: 48%  12/30: 46.9% 1/25: 46.99%; 07/27/2020 FOTO 42% 4/21: 47%    Time 12    Period Weeks    Status Partially Met    Target Date 11/18/20      PT LONG TERM GOAL #2   Title Patient (< 21 years old) will complete five times sit to stand test in < 20 seconds indicating an increased LE strength and improved balance.    Baseline 7/27: 26.8 seconds with one partial stand 6/22: 40. 86 sec; unable to reach full stand last two attempts 5/19: 42 seconds 10/5: BUE reliance, patient unable to complete 4 full stands. Attempt next session 11/2: 23.71 seconds 12/30: 30.32 with one near LOB due to spasms 1/25: 27.11 seconds with one hand on RW one hand on chair; 37.5 seconds with use of BUEs 4/21: 28.2 seconds    Time 12    Period Weeks    Status Partially Met    Target Date 11/18/20      PT LONG TERM GOAL #3   Title Patient will increase 10 meter walk test to <20 seconds as to improve gait speed for better community ambulation and to reduce fall risk.    Baseline 8/31: 35.1 seconds with RW 7/27: 36 seconds with RW 6/22: 36.1 seconds 5/19: 42 seconds 10/5: 31.9 seconds with RW 11/2: 29.9  seconds with RW 12/30: deferred due to spasm 1/25: defer to next session 1/27: 29 seconds with RW; 07/27/2020 deferred; 07/29/2020 41.5 sec (0.24 m/s) 4/21: 32.3 seconds with RW    Time 12    Period Weeks    Status Partially Met    Target Date 11/18/20      PT LONG TERM GOAL #4   Title Patient will reduce timed up and go to <11 seconds to reduce fall risk and demonstrate improved transfer/gait ability.    Baseline 8/31: 50.8 seconds with RW 7/27: 43.65 6/22: 45.4 seconds 5/19: 47 seconds 10/5: 38.5 seconds with RW 11/2: 35.72 seconds with RW 12/30: 43.5 seconds with wheels wet. 1/25: deferred 1/27: 38.9 seconds; 07/27/2020 82 seconds with RW, CGA; 07/29/2020 44 seconds 4/21: 38 seconds with RW    Time 12    Period Weeks    Status Partially Met    Target Date 11/18/20      PT LONG TERM GOAL #5   Title Patient will ambulate from physical therapy gym to elevator down the hall, ride elevator to first floor, and walk to valet with RW and chair follow for increased capacity for mobility and ind.    Baseline 8/31: able to open door ~50 ft with one rest break 8/10: unable to ambulate to elevator without rest break 9/30: able to ambulate to elevator with 3 rest breaks 1/25: 181 ft with 2 seated rest break (to elevator); 07/27/2020 deferred 4/21 able to perform with one seated rest break to elevator unable to get into elevator yet    Time 12    Period Weeks    Status Partially Met    Target Date 11/18/20      PT LONG TERM GOAL #6   Title Patient will be able to stand with RW and lift one leg onto 4" step for carryover to negotiation of steps at home.  Baseline 11/2: unable to perform 1/25: R foot 0.75 inch lift; 07/27/2020 heel not fully off ground, but pt slightly able to lift R LE to tap side of step while holding onto RW with both hands 4/21: able to clear foot ~3/4 inch with RLE    Time 12    Period Weeks    Status Partially Met    Target Date 11/18/20                 Plan - 10/07/20  1244    Clinical Impression Statement Increased rest breaks required throughout physical therapy session due to increased temperatures resulting in diffuse fatigue throughout body. She is highly motivated despite the fatigue and is able to safely negotiate cones without knocking a cone over, crossing her limbs, or losing her balance. The patient would benefit from continued skilled PT intervention to maximize QOL, safety, and function    Personal Factors and Comorbidities Age;Comorbidity 3+;Fitness;Past/Current Experience;Sex;Time since onset of injury/illness/exacerbation    Comorbidities HTN, SVT, neurogenic bowel, MS (1995), neuropathy, hx of wrist fx, HPV, Hypercholesteremia, adiposity, osteopenia, spasticity.    Examination-Activity Limitations Bathing;Bed Mobility;Bend;Carry;Continence;Dressing;Hygiene/Grooming;Stairs;Squat;Reach Overhead;Locomotion Level;Lift;Stand;Transfers    Examination-Participation Restrictions Church;Cleaning;Community Activity;Laundry;Volunteer;Shop;Meal Prep;Yard Work    [employer] Evolving/Moderate complexity    Rehab Potential Good    PT Frequency 2x / week    PT Duration 12 weeks    PT Treatment/Interventions ADLs/Self Care Home Management;Aquatic Therapy;Biofeedback;Cryotherapy;Electrical Stimulation;Iontophoresis 36m/ml Dexamethasone;Traction;Moist Heat;Ultrasound;DME Instruction;Gait training;Stair training;Functional mobility training;Therapeutic activities;Therapeutic exercise;Balance training;Neuromuscular re-education;Patient/family education;Orthotic Fit/Training;Wheelchair mobility training;Manual techniques;Compression bandaging;Passive range of motion;Dry needling;Energy conservation;Taping;Splinting;Vestibular    PT Next Visit Plan step negotiation, turning directions to the L and R; re-test goals    PT Home Exercise Plan maintain [place] [facility]P [place] Agree with Plan of Care Patient           Patient will  benefit from skilled therapeutic intervention in order to improve the following deficits and impairments:  Abnormal gait,Decreased activity tolerance,Decreased balance,Decreased coordination,Decreased endurance,Decreased mobility,Decreased range of motion,Decreased strength,Difficulty walking,Impaired perceived functional ability,Impaired sensation,Impaired tone,Impaired UE functional use,Improper body mechanics,Postural dysfunction,Cardiopulmonary status limiting activity,Impaired flexibility,Increased muscle spasms  Visit Diagnosis: Muscle weakness (generalized)  Other abnormalities of gait and mobility  Unsteadiness on feet  Multiple sclerosis exacerbation ([place]     Problem List Patient Active Problem List   Diagnosis Date Noted  . Hypoalbuminemia due to protein-calorie malnutrition ([place]   . Neurogenic bowel   . Neurogenic bladder   . Labile blood pressure   . Neuropathic pain   . Abscess of female pelvis   . SVT (supraventricular tachycardia) ([place]   . Radial styloid tenosynovitis 03/12/2018  . Wheelchair confinement 02/27/2018  . Localized osteoporosis with current pathological fracture with routine healing 01/19/2017  . Wrist fracture 01/16/2017  . Sprain of ankle 03/23/2016  . Closed fracture of lateral malleolus 03/16/2016  . Health care maintenance 01/24/2016  . Blood pressure elevated without history of HTN 10/25/2015  . Essential hypertension 10/25/2015  . Multiple sclerosis ([place] 10/02/2015  . Chronic left shoulder pain 07/19/2015  . [place] sclerosis exacerbation ([place] 07/14/2015  . MS (multiple sclerosis) ([place] 11/26/2014  . Increased body mass index 11/26/2014  . HPV test positive 11/26/2014  . Status post laparoscopic supracervical hysterectomy 11/26/2014  . Galactorrhea 11/26/2014  . Back ache 05/21/2014  . Adiposity 05/21/2014  . Disordered sleep 05/21/2014  . Muscle spasticity 05/21/2014  . Spasticity 05/21/2014  . Calculus of kidney 12/09/2013  .  Renal colic 016/02/9603 .  Hypercholesteremia 08/19/2013  . Hereditary and idiopathic neuropathy 08/19/2013  . Hypercholesterolemia without hypertriglyceridemia 08/19/2013  . Bladder infection, chronic 07/25/2012  . Disorder of bladder function 07/25/2012  . Incomplete bladder emptying 07/25/2012  . Microscopic hematuria 07/25/2012  . Right upper quadrant pain 07/25/2012    , PT, DPT    10/07/2020, 12:45 PM  [place] [facility] [address] [place], [place], [address] Phone: [phone]   Fax:  [phone]  Name: Lisa Williams MRN: [id number] Date of Birth: 01/16/1961   

## 2020-10-08 LAB — CULTURE, URINE COMPREHENSIVE

## 2020-10-12 ENCOUNTER — Other Ambulatory Visit: Payer: Self-pay

## 2020-10-12 ENCOUNTER — Ambulatory Visit: Payer: PPO

## 2020-10-12 DIAGNOSIS — R2681 Unsteadiness on feet: Secondary | ICD-10-CM

## 2020-10-12 DIAGNOSIS — M6281 Muscle weakness (generalized): Secondary | ICD-10-CM

## 2020-10-12 DIAGNOSIS — R2689 Other abnormalities of gait and mobility: Secondary | ICD-10-CM

## 2020-10-12 DIAGNOSIS — G35 Multiple sclerosis: Secondary | ICD-10-CM

## 2020-10-12 NOTE — Therapy (Signed)
[place] MAIN [facility] SERVICES [address] [place], [place], [address] Phone: [phone]   Fax:  [phone]  Physical Therapy Treatment /Physical Therapy Progress Note   Dates of reporting period  09/07/20  to   10/12/20  Patient Details  Name: Lisa Williams MRN: [id number] Date of Birth: [birth year] No data recorded  Encounter Date: 10/12/2020   PT End of Session - 10/12/20 1248    Visit Number 100    Number of Visits 112    Date for PT Re-Evaluation 11/18/20    Authorization Type 10/10 PN 5/3; next session 1/10 PN 10/12/20    PT Start Time 1015    PT Stop Time 1101    PT Time Calculation (min) 46 min    Equipment Utilized During Treatment Gait belt;Other (comment)    Activity Tolerance Patient tolerated treatment well;Patient limited by fatigue    Behavior During Therapy [facility] for tasks assessed/performed           Past Medical History:  Diagnosis Date  . Abdominal pain, right upper quadrant   . Back pain   . Calculus of kidney 12/09/2013  . Chronic back pain    unspecified  . Chronic left shoulder pain 07/19/2015  . Complication of anesthesia   . Functional disorder of bladder    other  . Galactorrhea 11/26/2014   Chronic   . Hereditary and idiopathic neuropathy 08/19/2013  . HPV test positive   . Hypercholesteremia 08/19/2013  . Hypertension   . Incomplete bladder emptying   . Microscopic hematuria   . MS (multiple sclerosis) ([place])   . Muscle spasticity 05/21/2014  . Nonspecific findings on examination of urine    other  . Osteopenia   . PONV (postoperative nausea and vomiting)   . Status post laparoscopic supracervical hysterectomy 11/26/2014  . Tobacco user 11/26/2014  . Wrist fracture     Past Surgical History:  Procedure Laterality Date  . bilateral tubal ligation  1996  . BREAST CYST EXCISION Left 2002  . FRACTURE SURGERY    . KNEE SURGERY     right  . LAPAROSCOPIC SUPRACERVICAL HYSTERECTOMY  08/05/2013  .  ORIF WRIST FRACTURE Left 01/17/2017   Procedure: OPEN REDUCTION INTERNAL FIXATION (ORIF) WRIST FRACTURE;  Surgeon: Lovell Sheehan, MD;  Location: [facility] ORS;  Service: Orthopedics;  Laterality: Left;  . RADIOLOGY WITH ANESTHESIA N/A 03/18/2020   Procedure: MRI WITH ANESTHESIA CERVICAL SPINE AND BRAIN  WITH AND WITHOUT CONTRAST;  Surgeon: Radiologist, Medication, MD;  Location: [place];  Service: Radiology;  Laterality: N/A;  . TUBAL LIGATION Bilateral   . VAGINAL HYSTERECTOMY  03/2006    There were no vitals filed for this visit.   Subjective Assessment - 10/12/20 1245    Subjective Patient reports she is having intermittent back spasms since this morning.    Pertinent History Patient is previous patient of this therapist, was admitted to the hospital on 08/03/19 for MS exacerbation and then went to inpatient rehab on 08/08/19. Patient then received home health therapy and now is returning to outpatient PT. PMh includes HTN, SVT, neurogenic bowel, MS (1995), neuropathy, hx of wrist fx, HPV, Hypercholesteremia, adiposity, osteopenia, spasticity. She wears bilateral AFOs and uses a RW and/or wheelchair for mobility. She drives an adapted car.    Limitations Lifting;Standing;Walking;House hold activities    How long can you sit comfortably? n/a    How long can you stand comfortably? 4 minutes    How long can you walk  comfortably? walked 50 ft in inpatient rehab    Patient Stated Goals return to PLOF, increase walking, get into shower, increase mobility    Currently in Pain? No/denies                   Goals:  STS SUE support: requires BUE support  FOTO: 44%  5x STS: 27.67 with BUE support  10 MWT: 38.71 with RW  TUG 42.31 with RW  Ambulate from gym to valet: able to perform with three rest breaks Stand and lift leg onto 4" step : 0.75 inch RLE   Treatment: Large swiss ball forward and diagonal rollout x2 minutes Hamstring lengthening with overpressure 2x 40 seconds each LE  Seated  STM with implementation of effleurage and ptrissage for spasm reduction x 9 minutes     Patient's condition has the potential to improve in response to therapy. Maximum improvement is yet to be obtained. The anticipated improvement is attainable and reasonable in a generally predictable time.  Patient reports she is improving with her ability to move, but is having spasms today.   Patient did not make progress towards goals this session except for sit to stand goal as she is having excessive spasms throughout back/body affecting her mobility and stability. It additionally is a higher temperature outside which fatigues the patient. She has in general been progressing with her functional mobility with today being an exception.  Patient's condition has the potential to improve in response to therapy. Maximum improvement is yet to be obtained. The anticipated improvement is attainable and reasonable in a generally predictable time.  The patient would benefit from continued skilled PT intervention to maximize QOL, safety, and function            PT Education - 10/12/20 1246    Education provided Yes    Education Details goals, Geophysicist/field seismologist) Educated Patient    Methods Explanation;Demonstration;Tactile cues;Verbal cues    Comprehension Verbalized understanding;Returned demonstration;Verbal cues required;Tactile cues required            PT Short Term Goals - 08/26/20 1115      PT SHORT TERM GOAL #1   Title Patient will be independent in home exercise program to improve strength/mobility for better functional independence with ADLs.    Baseline 5/19: HEP next session 6/22: HEP compliant 8/10 : HEP progressions compliant 8/31 HEP compliant 10/5: HEP compliant    Time 4    Period Weeks    Status Achieved    Target Date 01/13/20      PT SHORT TERM GOAL #2   Title Patient will perform a sit to stand with SUE support for increased ind. with transfers.    Baseline 5/19: heavy  BUE support 6/22: requires one hand on walker one hand on w/c 8/10: able to perform intermittently (some days can some days cannot) 8/31: attempt next session 10/5: BUE reliance, patient unable to complete 4 full stands. Attempt next session 1/25: able to perform from elevated surface only; 3/22 pt pushes through L arm off of wheelchair with R arm on RW for tactile cue/balance to perform transfer 4/21: requires use of UE's    Time 4    Period Weeks    Status On-going    Target Date 09/23/20             PT Long Term Goals - 10/12/20 0001      PT LONG TERM GOAL #1   Title Patient will increase  FOTO score to equal to or greater than  53/100   to demonstrate statistically significant improvement in mobility and quality of life.    Baseline 8/31: 48.13% 7/27:  48.1% 6/22: 46.99% 5/18: 48/100 10/5: 49/100 11/2: 48%  12/30: 46.9% 1/25: 46.99%; 07/27/2020 FOTO 42% 4/21: 47% 6/7: 44%    Time 12    Period Weeks    Status Partially Met    Target Date 11/18/20      PT LONG TERM GOAL #2   Title Patient (< 27 years old) will complete five times sit to stand test in < 20 seconds indicating an increased LE strength and improved balance.    Baseline 7/27: 26.8 seconds with one partial stand 6/22: 40. 86 sec; unable to reach full stand last two attempts 5/19: 42 seconds 10/5: BUE reliance, patient unable to complete 4 full stands. Attempt next session 11/2: 23.71 seconds 12/30: 30.32 with one near LOB due to spasms 1/25: 27.11 seconds with one hand on RW one hand on chair; 37.5 seconds with use of BUEs 4/21: 28.2 seconds 6/7: 27.67 with BUE    Time 12    Period Weeks    Status Partially Met    Target Date 11/18/20      PT LONG TERM GOAL #3   Title Patient will increase 10 meter walk test to <20 seconds as to improve gait speed for better community ambulation and to reduce fall risk.    Baseline 8/31: 35.1 seconds with RW 7/27: 36 seconds with RW 6/22: 36.1 seconds 5/19: 42 seconds 10/5: 31.9 seconds with  RW 11/2: 29.9 seconds with RW 12/30: deferred due to spasm 1/25: defer to next session 1/27: 29 seconds with RW; 07/27/2020 deferred; 07/29/2020 41.5 sec (0.24 m/s) 4/21: 32.3 seconds with RW 6/7: 38.71    Time 12    Period Weeks    Status Partially Met    Target Date 11/18/20      PT LONG TERM GOAL #4   Title Patient will reduce timed up and go to <11 seconds to reduce fall risk and demonstrate improved transfer/gait ability.    Baseline 8/31: 50.8 seconds with RW 7/27: 43.65 6/22: 45.4 seconds 5/19: 47 seconds 10/5: 38.5 seconds with RW 11/2: 35.72 seconds with RW 12/30: 43.5 seconds with wheels wet. 1/25: deferred 1/27: 38.9 seconds; 07/27/2020 82 seconds with RW, CGA; 07/29/2020 44 seconds 4/21: 38 seconds with RW 6/7: 42.31 with RW    Time 12    Period Weeks    Status Partially Met    Target Date 11/18/20      PT LONG TERM GOAL #5   Title Patient will ambulate from physical therapy gym to elevator down the hall, ride elevator to first floor, and walk to valet with RW and chair follow for increased capacity for mobility and ind.    Baseline 8/31: able to open door ~50 ft with one rest break 8/10: unable to ambulate to elevator without rest break 9/30: able to ambulate to elevator with 3 rest breaks 1/25: 181 ft with 2 seated rest break (to elevator); 07/27/2020 deferred 4/21 able to perform with one seated rest break to elevator unable to get into elevator yet 6/7: able to perform with three rest breaks    Time 12    Period Weeks    Status Partially Met    Target Date 11/18/20      PT LONG TERM GOAL #6   Title Patient will be able to stand with RW and  lift one leg onto 4" step for carryover to negotiation of steps at home.    Baseline 11/2: unable to perform 1/25: R foot 0.75 inch lift; 07/27/2020 heel not fully off ground, but pt slightly able to lift R LE to tap side of step while holding onto RW with both hands 4/21: able to clear foot ~3/4 inch with RLE 6/7 .75 with RLE    Time 12     Period Weeks    Status Partially Met    Target Date 11/18/20                 Plan - 10/12/20 1252    Clinical Impression Statement Patient did not make progress towards goals this session except for sit to stand goal as she is having excessive spasms throughout back/body affecting her mobility and stability. It additionally is a higher temperature outside which fatigues the patient. She has in general been progressing with her functional mobility with today being an exception.  Patient's condition has the potential to improve in response to therapy. Maximum improvement is yet to be obtained. The anticipated improvement is attainable and reasonable in a generally predictable time.  The patient would benefit from continued skilled PT intervention to maximize QOL, safety, and function    Personal Factors and Comorbidities Age;Comorbidity 3+;Fitness;Past/Current Experience;Sex;Time since onset of injury/illness/exacerbation    Comorbidities HTN, SVT, neurogenic bowel, MS (1995), neuropathy, hx of wrist fx, HPV, Hypercholesteremia, adiposity, osteopenia, spasticity.    Examination-Activity Limitations Bathing;Bed Mobility;Bend;Carry;Continence;Dressing;Hygiene/Grooming;Stairs;Squat;Reach Overhead;Locomotion Level;Lift;Stand;Transfers    Examination-Participation Restrictions Church;Cleaning;Community Activity;Laundry;Volunteer;Shop;Meal Prep;Yard Work    Merchant [employer] officer Evolving/Moderate complexity    Rehab Potential Good    PT Frequency 2x / week    PT Duration 12 weeks    PT Treatment/Interventions ADLs/Self Care Home Management;Aquatic Therapy;Biofeedback;Cryotherapy;Electrical Stimulation;Iontophoresis 44m/ml Dexamethasone;Traction;Moist Heat;Ultrasound;DME Instruction;Gait training;Stair training;Functional mobility training;Therapeutic activities;Therapeutic exercise;Balance training;Neuromuscular re-education;Patient/family education;Orthotic Fit/Training;Wheelchair  mobility training;Manual techniques;Compression bandaging;Passive range of motion;Dry needling;Energy conservation;Taping;Splinting;Vestibular    PT Next Visit Plan step negotiation, turning directions to the L and R; re-test goals    PT Home Exercise Plan maintain current HEP from [facility] Agree with Plan of Care Patient           Patient will benefit from skilled therapeutic intervention in order to improve the following deficits and impairments:  Abnormal gait,Decreased activity tolerance,Decreased balance,Decreased coordination,Decreased endurance,Decreased mobility,Decreased range of motion,Decreased strength,Difficulty walking,Impaired perceived functional ability,Impaired sensation,Impaired tone,Impaired UE functional use,Improper body mechanics,Postural dysfunction,Cardiopulmonary status limiting activity,Impaired flexibility,Increased muscle spasms  Visit Diagnosis: Muscle weakness (generalized)  Other abnormalities of gait and mobility  Unsteadiness on feet  Multiple sclerosis exacerbation ([place]     Problem List Patient Active Problem List   Diagnosis Date Noted  . Hypoalbuminemia due to protein-calorie malnutrition ([place]   . Neurogenic bowel   . Neurogenic bladder   . Labile blood pressure   . Neuropathic pain   . Abscess of female pelvis   . SVT (supraventricular tachycardia) ([place]   . Radial styloid tenosynovitis 03/12/2018  . Wheelchair confinement 02/27/2018  . Localized osteoporosis with current pathological fracture with routine healing 01/19/2017  . Wrist fracture 01/16/2017  . Sprain of ankle 03/23/2016  . Closed fracture of lateral malleolus 03/16/2016  . Health care maintenance 01/24/2016  . Blood pressure elevated without history of HTN 10/25/2015  . Essential hypertension 10/25/2015  . Multiple sclerosis ([place] 10/02/2015  . Chronic left shoulder pain 07/19/2015  . Multiple sclerosis exacerbation ([place] 07/14/2015  . MS (multiple  sclerosis) ([place]) 11/26/2014  . Increased body mass index 11/26/2014  . HPV test positive 11/26/2014  . Status post laparoscopic supracervical hysterectomy 11/26/2014  . Galactorrhea 11/26/2014  . Back ache 05/21/2014  . Adiposity 05/21/2014  . Disordered sleep 05/21/2014  . Muscle spasticity 05/21/2014  . Spasticity 05/21/2014  . Calculus of kidney 12/09/2013  . Renal colic 60/63/0160  . Hypercholesteremia 08/19/2013  . Hereditary and idiopathic neuropathy 08/19/2013  . Hypercholesterolemia without hypertriglyceridemia 08/19/2013  . Bladder infection, chronic 07/25/2012  . Disorder of bladder function 07/25/2012  . Incomplete bladder emptying 07/25/2012  . Microscopic hematuria 07/25/2012  . Right upper quadrant pain 07/25/2012   Janna Arch, PT, DPT   10/12/2020, 12:56 PM  [place] MAIN [facility] SERVICES [address] [place], [place], [address] Phone: [phone]   Fax:  [phone]  Name: Shalaunda Weatherholtz Kinn MRN: [id number] Date of Birth: August 12, 1960

## 2020-10-14 ENCOUNTER — Other Ambulatory Visit: Payer: Self-pay

## 2020-10-14 ENCOUNTER — Ambulatory Visit: Payer: PPO | Admitting: Physical Therapy

## 2020-10-14 ENCOUNTER — Encounter: Payer: Self-pay | Admitting: Physical Therapy

## 2020-10-14 DIAGNOSIS — R2689 Other abnormalities of gait and mobility: Secondary | ICD-10-CM

## 2020-10-14 DIAGNOSIS — R2681 Unsteadiness on feet: Secondary | ICD-10-CM

## 2020-10-14 DIAGNOSIS — M6281 Muscle weakness (generalized): Secondary | ICD-10-CM

## 2020-10-14 NOTE — Therapy (Signed)
[place] MAIN [facility] SERVICES [address] [place], [place], [address] Phone: [phone]   Fax:  [phone]  Physical Therapy Treatment  Patient Details  Name: Lisa Williams MRN: [id number] Date of Birth: 1960/05/27 No data recorded  Encounter Date: 10/14/2020   PT End of Session - 10/14/20 1018     Visit Number 101    Number of Visits 112    Date for PT Re-Evaluation 11/18/20    Authorization Type 10/10 PN 5/3; next session 1/10 PN 10/12/20    PT Start Time 1016    PT Stop Time 1100    PT Time Calculation (min) 44 min    Equipment Utilized During Treatment Gait belt;Other (comment)    Activity Tolerance Patient tolerated treatment well;Patient limited by fatigue    Behavior During Therapy [facility] for tasks assessed/performed             Past Medical History:  Diagnosis Date   Abdominal pain, right upper quadrant    Back pain    Calculus of kidney 12/09/2013   Chronic back pain    unspecified   Chronic left shoulder pain 9/73/5329   Complication of anesthesia    Functional disorder of bladder    other   Galactorrhea 11/26/2014   Chronic    Hereditary and idiopathic neuropathy 08/19/2013   HPV test positive    Hypercholesteremia 08/19/2013   Hypertension    Incomplete bladder emptying    Microscopic hematuria    MS (multiple sclerosis) (HCC)    Muscle spasticity 05/21/2014   Nonspecific findings on examination of urine    other   Osteopenia    PONV (postoperative nausea and vomiting)    Status post laparoscopic supracervical hysterectomy 11/26/2014   Tobacco user 11/26/2014   Wrist fracture     Past Surgical History:  Procedure Laterality Date   bilateral tubal ligation  1996   BREAST CYST EXCISION Left 2002   FRACTURE SURGERY     KNEE SURGERY     right   LAPAROSCOPIC SUPRACERVICAL HYSTERECTOMY  08/05/2013   ORIF WRIST FRACTURE Left 01/17/2017   Procedure: OPEN REDUCTION INTERNAL FIXATION (ORIF) WRIST FRACTURE;   Surgeon: Lovell Sheehan, MD;  Location: [facility];  Service: Orthopedics;  Laterality: Left;   RADIOLOGY WITH ANESTHESIA N/A 03/18/2020   Procedure: MRI WITH ANESTHESIA CERVICAL SPINE AND BRAIN  WITH AND WITHOUT CONTRAST;  Surgeon: Radiologist, Medication, MD;  Location: [place];  Service: Radiology;  Laterality: N/A;   TUBAL LIGATION Bilateral    VAGINAL HYSTERECTOMY  03/2006    There were no vitals filed for this visit.   Subjective Assessment - 10/14/20 1017     Subjective Patient reports doing well today, no pain; States, "We normally walk on Thursdays."    Pertinent History Patient is previous patient of this therapist, was admitted to the hospital on 08/03/19 for MS exacerbation and then went to inpatient rehab on 08/08/19. Patient then received home health therapy and now is returning to outpatient PT. PMh includes HTN, SVT, neurogenic bowel, MS (1995), neuropathy, hx of wrist fx, HPV, Hypercholesteremia, adiposity, osteopenia, spasticity. She wears bilateral AFOs and uses a RW and/or wheelchair for mobility. She drives an adapted car.    Limitations Lifting;Standing;Walking;House hold activities    How long can you sit comfortably? n/a    How long can you stand comfortably? 4 minutes    How long can you walk comfortably? walked 50 ft in inpatient rehab    Patient  Stated Goals return to PLOF, increase walking, get into shower, increase mobility    Currently in Pain? No/denies    Multiple Pain Sites No              TREATMENT: Patient ambulates >129f, 2 seated rest breaks CGA and wheelchair follow for safety. Patient required min VCs for correct technique including to increase core stabilization to facilitate better hip flexion to advance limb forward. Patient demonstrates inc reliance on BUE by end of gait trial. She started exhibiting increased circumduction with LLE towards end of distance with increased fatigue reported.     Seated:  Instructed patient in BUE strengthening and  postural strengthening: BUE low row green tband x15 reps x2 sets;  Single UE shoulder ER x15 reps each UE, decreased ROM noted in LUE; required min VCs to keep elbow by side to isolate shoulder ER;  Shoulder extension with green tband with shoulder flexion AAROM x10 reps each UE;   4# handweight in BUE: -bicep curl x20 reps; -alternate UE lift x10 reps each, 3# on LUE with min VCs to avoid painful ROM with elbow bent for better tolerance;     Seated shoulder mid row x10 reps each UE with cues to avoid painful ROM; Finished with posterior shoulder rolls x10 reps;  Pt educated throughout session about proper posture and technique with exercises. Improved exercise technique, movement at target joints, use of target muscles after min to mod verbal, visual, tactile cues     Increased rest breaks required throughout physical therapy session due to increased temperatures resulting in diffuse fatigue throughout body. She is highly motivated despite the fatigue .The patient would benefit from continued skilled PT intervention to maximize QOL, safety, and function                         PT Education - 10/14/20 1017     Education provided Yes    Education Details strengthening/gait safety;    Person(s) Educated Patient    Methods Explanation;Verbal cues    Comprehension Verbalized understanding;Returned demonstration;Verbal cues required;Need further instruction              PT Short Term Goals - 08/26/20 1115       PT SHORT TERM GOAL #1   Title Patient will be independent in home exercise program to improve strength/mobility for better functional independence with ADLs.    Baseline 5/19: HEP next session 6/22: HEP compliant 8/10 : HEP progressions compliant 8/31 HEP compliant 10/5: HEP compliant    Time 4    Period Weeks    Status Achieved    Target Date 01/13/20      PT SHORT TERM GOAL #2   Title Patient will perform a sit to stand with SUE support for  increased ind. with transfers.    Baseline 5/19: heavy BUE support 6/22: requires one hand on walker one hand on w/c 8/10: able to perform intermittently (some days can some days cannot) 8/31: attempt next session 10/5: BUE reliance, patient unable to complete 4 full stands. Attempt next session 1/25: able to perform from elevated surface only; 3/22 pt pushes through L arm off of wheelchair with R arm on RW for tactile cue/balance to perform transfer 4/21: requires use of UE's    Time 4    Period Weeks    Status On-going    Target Date 09/23/20               PT  Long Term Goals - 10/12/20 0001       PT LONG TERM GOAL #1   Title Patient will increase FOTO score to equal to or greater than  53/100   to demonstrate statistically significant improvement in mobility and quality of life.    Baseline 8/31: 48.13% 7/27:  48.1% 6/22: 46.99% 5/18: 48/100 10/5: 49/100 11/2: 48%  12/30: 46.9% 1/25: 46.99%; 07/27/2020 FOTO 42% 4/21: 47% 6/7: 44%    Time 12    Period Weeks    Status Partially Met    Target Date 11/18/20      PT LONG TERM GOAL #2   Title Patient (< 25 years old) will complete five times sit to stand test in < 20 seconds indicating an increased LE strength and improved balance.    Baseline 7/27: 26.8 seconds with one partial stand 6/22: 40. 86 sec; unable to reach full stand last two attempts 5/19: 42 seconds 10/5: BUE reliance, patient unable to complete 4 full stands. Attempt next session 11/2: 23.71 seconds 12/30: 30.32 with one near LOB due to spasms 1/25: 27.11 seconds with one hand on RW one hand on chair; 37.5 seconds with use of BUEs 4/21: 28.2 seconds 6/7: 27.67 with BUE    Time 12    Period Weeks    Status Partially Met    Target Date 11/18/20      PT LONG TERM GOAL #3   Title Patient will increase 10 meter walk test to <20 seconds as to improve gait speed for better community ambulation and to reduce fall risk.    Baseline 8/31: 35.1 seconds with RW 7/27: 36 seconds with  RW 6/22: 36.1 seconds 5/19: 42 seconds 10/5: 31.9 seconds with RW 11/2: 29.9 seconds with RW 12/30: deferred due to spasm 1/25: defer to next session 1/27: 29 seconds with RW; 07/27/2020 deferred; 07/29/2020 41.5 sec (0.24 m/s) 4/21: 32.3 seconds with RW 6/7: 38.71    Time 12    Period Weeks    Status Partially Met    Target Date 11/18/20      PT LONG TERM GOAL #4   Title Patient will reduce timed up and go to <11 seconds to reduce fall risk and demonstrate improved transfer/gait ability.    Baseline 8/31: 50.8 seconds with RW 7/27: 43.65 6/22: 45.4 seconds 5/19: 47 seconds 10/5: 38.5 seconds with RW 11/2: 35.72 seconds with RW 12/30: 43.5 seconds with wheels wet. 1/25: deferred 1/27: 38.9 seconds; 07/27/2020 82 seconds with RW, CGA; 07/29/2020 44 seconds 4/21: 38 seconds with RW 6/7: 42.31 with RW    Time 12    Period Weeks    Status Partially Met    Target Date 11/18/20      PT LONG TERM GOAL #5   Title Patient will ambulate from physical therapy gym to elevator down the hall, ride elevator to first floor, and walk to valet with RW and chair follow for increased capacity for mobility and ind.    Baseline 8/31: able to open door ~50 ft with one rest break 8/10: unable to ambulate to elevator without rest break 9/30: able to ambulate to elevator with 3 rest breaks 1/25: 181 ft with 2 seated rest break (to elevator); 07/27/2020 deferred 4/21 able to perform with one seated rest break to elevator unable to get into elevator yet 6/7: able to perform with three rest breaks    Time 12    Period Weeks    Status Partially Met    Target Date  11/18/20      PT LONG TERM GOAL #6   Title Patient will be able to stand with RW and lift one leg onto 4" step for carryover to negotiation of steps at home.    Baseline 11/2: unable to perform 1/25: R foot 0.75 inch lift; 07/27/2020 heel not fully off ground, but pt slightly able to lift R LE to tap side of step while holding onto RW with both hands 4/21: able to  clear foot ~3/4 inch with RLE 6/7 .75 with RLE    Time 12    Period Weeks    Status Partially Met    Target Date 11/18/20                   Plan - 10/14/20 1110     Clinical Impression Statement Patient motivated and participated well within session. She was instructed in advanced gait tasks with instruction for improved core stabilization and increased ankle DF to facilitate better step length. Patient does exhibit increased circumduction with fatigue requiring short seated rest break. Utilized cryotherapy throughout session to help with reducing overheating for better tolerance. Patient instructed in advanced UE and postural strengthening. Patient does require min VCs for proper exercise technique. She would benefit from additional skilled PT intervention to improve strength, balance and mobility;    Personal Factors and Comorbidities Age;Comorbidity 3+;Fitness;Past/Current Experience;Sex;Time since onset of injury/illness/exacerbation    Comorbidities HTN, SVT, neurogenic bowel, MS (1995), neuropathy, hx of wrist fx, HPV, Hypercholesteremia, adiposity, osteopenia, spasticity.    Examination-Activity Limitations Bathing;Bed Mobility;Bend;Carry;Continence;Dressing;Hygiene/Grooming;Stairs;Squat;Reach Overhead;Locomotion Level;Lift;Stand;Transfers    Examination-Participation Restrictions Church;Cleaning;Community Activity;Laundry;Volunteer;Shop;Meal Prep;Yard Work    [employer] Evolving/Moderate complexity    Rehab Potential Good    PT Frequency 2x / week    PT Duration 12 weeks    PT Treatment/Interventions ADLs/Self Care Home Management;Aquatic Therapy;Biofeedback;Cryotherapy;Electrical Stimulation;Iontophoresis 5m/ml Dexamethasone;Traction;Moist Heat;Ultrasound;DME Instruction;Gait training;Stair training;Functional mobility training;Therapeutic activities;Therapeutic exercise;Balance training;Neuromuscular re-education;Patient/family education;Orthotic  Fit/Training;Wheelchair mobility training;Manual techniques;Compression bandaging;Passive range of motion;Dry needling;Energy conservation;Taping;Splinting;Vestibular    PT Next Visit Plan step negotiation, turning directions to the L and R; re-test goals    PT Home Exercise Plan maintain current HEP from [facility] Agree with Plan of Care Patient             Patient will benefit from skilled therapeutic intervention in order to improve the following deficits and impairments:  Abnormal gait, Decreased activity tolerance, Decreased balance, Decreased coordination, Decreased endurance, Decreased mobility, Decreased range of motion, Decreased strength, Difficulty walking, Impaired perceived functional ability, Impaired sensation, Impaired tone, Impaired UE functional use, Improper body mechanics, Postural dysfunction, Cardiopulmonary status limiting activity, Impaired flexibility, Increased muscle spasms  Visit Diagnosis: Muscle weakness (generalized)  Other abnormalities of gait and mobility  Unsteadiness on feet     Problem List Patient Active Problem List   Diagnosis Date Noted   Hypoalbuminemia due to protein-calorie malnutrition (HCC)    Neurogenic bowel    Neurogenic bladder    Labile blood pressure    Neuropathic pain    Abscess of female pelvis    SVT (supraventricular tachycardia) (HCC)    Radial styloid tenosynovitis 03/12/2018   Wheelchair confinement 02/27/2018   Localized osteoporosis with current pathological fracture with routine healing 01/19/2017   Wrist fracture 01/16/2017   Sprain of ankle 03/23/2016   Closed fracture of lateral malleolus 03/16/2016   Health care maintenance 01/24/2016   Blood pressure elevated without history of HTN 10/25/2015   Essential hypertension 10/25/2015  Multiple sclerosis ([place]) 10/02/2015   Chronic left shoulder pain 07/19/2015   Multiple sclerosis exacerbation ([place]) 07/14/2015   MS (multiple sclerosis) ([place])  11/26/2014   Increased body mass index 11/26/2014   HPV test positive 11/26/2014   Status post laparoscopic supracervical hysterectomy 11/26/2014   Galactorrhea 11/26/2014   Back ache 05/21/2014   Adiposity 05/21/2014   Disordered sleep 05/21/2014   Muscle spasticity 05/21/2014   Spasticity 05/21/2014   Calculus of kidney 34/96/1164   Renal colic 35/39/1225   Hypercholesteremia 08/19/2013   Hereditary and idiopathic neuropathy 08/19/2013   Hypercholesterolemia without hypertriglyceridemia 08/19/2013   Bladder infection, chronic 07/25/2012   Disorder of bladder function 07/25/2012   Incomplete bladder emptying 07/25/2012   Microscopic hematuria 07/25/2012   Right upper quadrant pain 07/25/2012    Kyrah Schiro PT, DPT 10/14/2020, 11:14 AM  [facility] [address] [place], [place], [address] Phone: [phone]   Fax:  [phone]  Name: Lisa Williams MRN: [id number] Date of Birth: 12-14-1960

## 2020-10-19 ENCOUNTER — Other Ambulatory Visit: Payer: Self-pay

## 2020-10-19 ENCOUNTER — Ambulatory Visit: Payer: PPO

## 2020-10-19 DIAGNOSIS — M6281 Muscle weakness (generalized): Secondary | ICD-10-CM

## 2020-10-19 DIAGNOSIS — R2681 Unsteadiness on feet: Secondary | ICD-10-CM

## 2020-10-19 DIAGNOSIS — R2689 Other abnormalities of gait and mobility: Secondary | ICD-10-CM

## 2020-10-19 NOTE — Therapy (Signed)
[place] MAIN [facility] SERVICES [address] [place], [place], [address] Phone: [phone]   Fax:  [phone]  Physical Therapy Treatment  Patient Details  Name: Lisa Williams MRN: [id number] Date of Birth: 09-02-1960 No data recorded  Encounter Date: 10/19/2020   PT End of Session - 10/19/20 1105     Visit Number 102    Number of Visits 112    Date for PT Re-Evaluation 11/18/20    Authorization Type 2/10 PN 10/12/20    PT Start Time 1015    PT Stop Time 1058    PT Time Calculation (min) 43 min    Equipment Utilized During Treatment Gait belt;Other (comment)    Activity Tolerance Patient tolerated treatment well;Patient limited by fatigue    Behavior During Therapy [facility] for tasks assessed/performed             Past Medical History:  Diagnosis Date   Abdominal pain, right upper quadrant    Back pain    Calculus of kidney 12/09/2013   Chronic back pain    unspecified   Chronic left shoulder pain 8/83/2549   Complication of anesthesia    Functional disorder of bladder    other   Galactorrhea 11/26/2014   Chronic    Hereditary and idiopathic neuropathy 08/19/2013   HPV test positive    Hypercholesteremia 08/19/2013   Hypertension    Incomplete bladder emptying    Microscopic hematuria    MS (multiple sclerosis) (HCC)    Muscle spasticity 05/21/2014   Nonspecific findings on examination of urine    other   Osteopenia    PONV (postoperative nausea and vomiting)    Status post laparoscopic supracervical hysterectomy 11/26/2014   Tobacco user 11/26/2014   Wrist fracture     Past Surgical History:  Procedure Laterality Date   bilateral tubal ligation  1996   BREAST CYST EXCISION Left 2002   FRACTURE SURGERY     KNEE SURGERY     right   LAPAROSCOPIC SUPRACERVICAL HYSTERECTOMY  08/05/2013   ORIF WRIST FRACTURE Left 01/17/2017   Procedure: OPEN REDUCTION INTERNAL FIXATION (ORIF) WRIST FRACTURE;  Surgeon: Lovell Sheehan,  MD;  Location: [facility];  Service: Orthopedics;  Laterality: Left;   RADIOLOGY WITH ANESTHESIA N/A 03/18/2020   Procedure: MRI WITH ANESTHESIA CERVICAL SPINE AND BRAIN  WITH AND WITHOUT CONTRAST;  Surgeon: Radiologist, Medication, MD;  Location: [place];  Service: Radiology;  Laterality: N/A;   TUBAL LIGATION Bilateral    VAGINAL HYSTERECTOMY  03/2006    There were no vitals filed for this visit.   Subjective Assessment - 10/19/20 1104     Subjective Patient reports she is doing well. Is raining outside, had her birthday yesterday but did not go out much due to the heat.    Pertinent History Patient is previous patient of this therapist, was admitted to the hospital on 08/03/19 for MS exacerbation and then went to inpatient rehab on 08/08/19. Patient then received home health therapy and now is returning to outpatient PT. PMh includes HTN, SVT, neurogenic bowel, MS (1995), neuropathy, hx of wrist fx, HPV, Hypercholesteremia, adiposity, osteopenia, spasticity. She wears bilateral AFOs and uses a RW and/or wheelchair for mobility. She drives an adapted car.    Limitations Lifting;Standing;Walking;House hold activities    How long can you sit comfortably? n/a    How long can you stand comfortably? 4 minutes    How long can you walk comfortably? walked 50 ft in inpatient  rehab    Patient Stated Goals return to PLOF, increase walking, get into shower, increase mobility    Currently in Pain? No/denies               Treatment: Seated: Hamstring isometric on dynadisc 10x 3 second holds each LE pressing through heel, 10x 3 second holds each LE pressing through toes RTB adduction 15x each LE; very challenging for patient RTB abduction bilateral LE at the same time 10x RTB ER 10x neutral grip, 10x inverse grip; cues for keeping elbow to body.  RTB cross body woodchop patterning 10x each side RTB straight arm flexion 10x each side, Pt assistance to stabilize band for optimal resistance.  Trunk  flexion/extension 10x with focus on core contraction.   PVC pipe: -overhead raise with scapular retraction 10x -row 10x with focus on neutral alignment and scapular retraction -overhand row 10x, underhand row circles 10x; very fatiguing, rest between required.  -single arm alternating row 10x each side, focus on full scapular retraction and depression.        Ice pack donned throughout session to minimize exacerbation of MS symptoms in order to perform interventions.      Pt educated throughout session about proper posture and technique with exercises. Improved exercise technique, movement at target joints, use of target muscles after min to mod verbal, visual, tactile cues     Patient tolerated progressive seated strengthening interventions well. She requires intermittent rest breaks with ice pack for reduction of heating for optimal performance of tasks. Patient is highly motivated throughout session and needs only intermittent external cueing for postural alignment. The patient would benefit from continued skilled PT intervention to maximize QOL, safety, and function              PT Education - 10/19/20 1105     Education provided Yes    Education Details exercise technique, body mechanics    Person(s) Educated Patient    Methods Explanation;Demonstration;Tactile cues;Verbal cues    Comprehension Verbalized understanding;Returned demonstration;Verbal cues required;Tactile cues required              PT Short Term Goals - 08/26/20 1115       PT SHORT TERM GOAL #1   Title Patient will be independent in home exercise program to improve strength/mobility for better functional independence with ADLs.    Baseline 5/19: HEP next session 6/22: HEP compliant 8/10 : HEP progressions compliant 8/31 HEP compliant 10/5: HEP compliant    Time 4    Period Weeks    Status Achieved    Target Date 01/13/20      PT SHORT TERM GOAL #2   Title Patient will perform a sit to  stand with SUE support for increased ind. with transfers.    Baseline 5/19: heavy BUE support 6/22: requires one hand on walker one hand on w/c 8/10: able to perform intermittently (some days can some days cannot) 8/31: attempt next session 10/5: BUE reliance, patient unable to complete 4 full stands. Attempt next session 1/25: able to perform from elevated surface only; 3/22 pt pushes through L arm off of wheelchair with R arm on RW for tactile cue/balance to perform transfer 4/21: requires use of UE's    Time 4    Period Weeks    Status On-going    Target Date 09/23/20               PT Long Term Goals - 10/12/20 0001       PT  LONG TERM GOAL #1   Title Patient will increase FOTO score to equal to or greater than  53/100   to demonstrate statistically significant improvement in mobility and quality of life.    Baseline 8/31: 48.13% 7/27:  48.1% 6/22: 46.99% 5/18: 48/100 10/5: 49/100 11/2: 48%  12/30: 46.9% 1/25: 46.99%; 07/27/2020 FOTO 42% 4/21: 47% 6/7: 44%    Time 12    Period Weeks    Status Partially Met    Target Date 11/18/20      PT LONG TERM GOAL #2   Title Patient (< 85 years old) will complete five times sit to stand test in < 20 seconds indicating an increased LE strength and improved balance.    Baseline 7/27: 26.8 seconds with one partial stand 6/22: 40. 86 sec; unable to reach full stand last two attempts 5/19: 42 seconds 10/5: BUE reliance, patient unable to complete 4 full stands. Attempt next session 11/2: 23.71 seconds 12/30: 30.32 with one near LOB due to spasms 1/25: 27.11 seconds with one hand on RW one hand on chair; 37.5 seconds with use of BUEs 4/21: 28.2 seconds 6/7: 27.67 with BUE    Time 12    Period Weeks    Status Partially Met    Target Date 11/18/20      PT LONG TERM GOAL #3   Title Patient will increase 10 meter walk test to <20 seconds as to improve gait speed for better community ambulation and to reduce fall risk.    Baseline 8/31: 35.1 seconds  with RW 7/27: 36 seconds with RW 6/22: 36.1 seconds 5/19: 42 seconds 10/5: 31.9 seconds with RW 11/2: 29.9 seconds with RW 12/30: deferred due to spasm 1/25: defer to next session 1/27: 29 seconds with RW; 07/27/2020 deferred; 07/29/2020 41.5 sec (0.24 m/s) 4/21: 32.3 seconds with RW 6/7: 38.71    Time 12    Period Weeks    Status Partially Met    Target Date 11/18/20      PT LONG TERM GOAL #4   Title Patient will reduce timed up and go to <11 seconds to reduce fall risk and demonstrate improved transfer/gait ability.    Baseline 8/31: 50.8 seconds with RW 7/27: 43.65 6/22: 45.4 seconds 5/19: 47 seconds 10/5: 38.5 seconds with RW 11/2: 35.72 seconds with RW 12/30: 43.5 seconds with wheels wet. 1/25: deferred 1/27: 38.9 seconds; 07/27/2020 82 seconds with RW, CGA; 07/29/2020 44 seconds 4/21: 38 seconds with RW 6/7: 42.31 with RW    Time 12    Period Weeks    Status Partially Met    Target Date 11/18/20      PT LONG TERM GOAL #5   Title Patient will ambulate from physical therapy gym to elevator down the hall, ride elevator to first floor, and walk to valet with RW and chair follow for increased capacity for mobility and ind.    Baseline 8/31: able to open door ~50 ft with one rest break 8/10: unable to ambulate to elevator without rest break 9/30: able to ambulate to elevator with 3 rest breaks 1/25: 181 ft with 2 seated rest break (to elevator); 07/27/2020 deferred 4/21 able to perform with one seated rest break to elevator unable to get into elevator yet 6/7: able to perform with three rest breaks    Time 12    Period Weeks    Status Partially Met    Target Date 11/18/20      PT LONG TERM GOAL #6  Title Patient will be able to stand with RW and lift one leg onto 4" step for carryover to negotiation of steps at home.    Baseline 11/2: unable to perform 1/25: R foot 0.75 inch lift; 07/27/2020 heel not fully off ground, but pt slightly able to lift R LE to tap side of step while holding onto RW with  both hands 4/21: able to clear foot ~3/4 inch with RLE 6/7 .75 with RLE    Time 12    Period Weeks    Status Partially Met    Target Date 11/18/20                   Plan - 10/19/20 1116     Clinical Impression Statement Patient tolerated progressive seated strengthening interventions well. She requires intermittent rest breaks with ice pack for reduction of heating for optimal performance of tasks. Patient is highly motivated throughout session and needs only intermittent external cueing for postural alignment. The patient would benefit from continued skilled PT intervention to maximize QOL, safety, and function    Personal Factors and Comorbidities Age;Comorbidity 3+;Fitness;Past/Current Experience;Sex;Time since onset of injury/illness/exacerbation    Comorbidities HTN, SVT, neurogenic bowel, MS (1995), neuropathy, hx of wrist fx, HPV, Hypercholesteremia, adiposity, osteopenia, spasticity.    Examination-Activity Limitations Bathing;Bed Mobility;Bend;Carry;Continence;Dressing;Hygiene/Grooming;Stairs;Squat;Reach Overhead;Locomotion Level;Lift;Stand;Transfers    Examination-Participation Restrictions Church;Cleaning;Community Activity;Laundry;Volunteer;Shop;Meal Prep;Yard Work    [employer] Evolving/Moderate complexity    Rehab Potential Good    PT Frequency 2x / week    PT Duration 12 weeks    PT Treatment/Interventions ADLs/Self Care Home Management;Aquatic Therapy;Biofeedback;Cryotherapy;Electrical Stimulation;Iontophoresis 37m/ml Dexamethasone;Traction;Moist Heat;Ultrasound;DME Instruction;Gait training;Stair training;Functional mobility training;Therapeutic activities;Therapeutic exercise;Balance training;Neuromuscular re-education;Patient/family education;Orthotic Fit/Training;Wheelchair mobility training;Manual techniques;Compression bandaging;Passive range of motion;Dry needling;Energy conservation;Taping;Splinting;Vestibular    PT Next Visit Plan step  negotiation, turning directions to the L and R; re-test goals    PT Home Exercise Plan maintain current HEP from [facility] Agree with Plan of Care Patient             Patient will benefit from skilled therapeutic intervention in order to improve the following deficits and impairments:  Abnormal gait, Decreased activity tolerance, Decreased balance, Decreased coordination, Decreased endurance, Decreased mobility, Decreased range of motion, Decreased strength, Difficulty walking, Impaired perceived functional ability, Impaired sensation, Impaired tone, Impaired UE functional use, Improper body mechanics, Postural dysfunction, Cardiopulmonary status limiting activity, Impaired flexibility, Increased muscle spasms  Visit Diagnosis: Muscle weakness (generalized)  Unsteadiness on feet  Other abnormalities of gait and mobility     Problem List Patient Active Problem List   Diagnosis Date Noted   Hypoalbuminemia due to protein-calorie malnutrition (HCC)    Neurogenic bowel    Neurogenic bladder    Labile blood pressure    Neuropathic pain    Abscess of female pelvis    SVT (supraventricular tachycardia) (HCC)    Radial styloid tenosynovitis 03/12/2018   Wheelchair confinement 02/27/2018   Localized osteoporosis with current pathological fracture with routine healing 01/19/2017   Wrist fracture 01/16/2017   Sprain of ankle 03/23/2016   Closed fracture of lateral malleolus 03/16/2016   Health care maintenance 01/24/2016   Blood pressure elevated without history of HTN 10/25/2015   Essential hypertension 10/25/2015   Multiple [place] ([place] 10/02/2015   Chronic left shoulder pain 07/19/2015   [place] ([place] 07/14/2015   [place] ([place]) ([place] 11/26/2014   Increased body mass index 11/26/2014   HPV test positive 11/26/2014   Status post laparoscopic  supracervical hysterectomy 11/26/2014   Galactorrhea 11/26/2014   Back ache 05/21/2014    Adiposity 05/21/2014   Disordered sleep 05/21/2014   Muscle spasticity 05/21/2014   Spasticity 05/21/2014   Calculus of kidney 25/18/9842   Renal colic 03/07/2810   Hypercholesteremia 08/19/2013   Hereditary and idiopathic neuropathy 08/19/2013   Hypercholesterolemia without hypertriglyceridemia 08/19/2013   Bladder infection, chronic 07/25/2012   Disorder of bladder function 07/25/2012   Incomplete bladder emptying 07/25/2012   Microscopic hematuria 07/25/2012   Right upper quadrant pain 07/25/2012   Janna Arch, PT, DPT   10/19/2020, 11:17 AM  [place] MAIN [facility] SERVICES [address] [place], [place], [address] Phone: [phone]   Fax:  [phone]  Name: Lisa Williams MRN: [id number] Date of Birth: [birth year]

## 2020-10-21 ENCOUNTER — Ambulatory Visit: Payer: PPO

## 2020-10-21 ENCOUNTER — Other Ambulatory Visit: Payer: Self-pay

## 2020-10-21 DIAGNOSIS — R2689 Other abnormalities of gait and mobility: Secondary | ICD-10-CM

## 2020-10-21 DIAGNOSIS — M6281 Muscle weakness (generalized): Secondary | ICD-10-CM

## 2020-10-21 DIAGNOSIS — R2681 Unsteadiness on feet: Secondary | ICD-10-CM

## 2020-10-21 DIAGNOSIS — G35 Multiple sclerosis: Secondary | ICD-10-CM

## 2020-10-21 NOTE — Therapy (Signed)
[place] MAIN [facility] SERVICES [address] [place], [place], [address] Phone: [phone]   Fax:  [phone]  Physical Therapy Treatment  Patient Details  Name: Lisa Williams MRN: [id number] Date of Birth: January 20, 1961 No data recorded  Encounter Date: 10/21/2020   PT End of Session - 10/21/20 2017     Visit Number 103    Number of Visits 112    Date for PT Re-Evaluation 11/18/20    Authorization Type 3/10 PN 10/12/20    PT Start Time 1015    PT Stop Time 1059    PT Time Calculation (min) 44 min    Equipment Utilized During Treatment Gait belt;Other (comment)    Activity Tolerance Patient tolerated treatment well;Patient limited by fatigue    Behavior During Therapy [facility] for tasks assessed/performed             Past Medical History:  Diagnosis Date   Abdominal pain, right upper quadrant    Back pain    Calculus of kidney 12/09/2013   Chronic back pain    unspecified   Chronic left shoulder pain 8/54/6270   Complication of anesthesia    Functional disorder of bladder    other   Galactorrhea 11/26/2014   Chronic    Hereditary and idiopathic neuropathy 08/19/2013   HPV test positive    Hypercholesteremia 08/19/2013   Hypertension    Incomplete bladder emptying    Microscopic hematuria    MS (multiple sclerosis) (HCC)    Muscle spasticity 05/21/2014   Nonspecific findings on examination of urine    other   Osteopenia    PONV (postoperative nausea and vomiting)    Status post laparoscopic supracervical hysterectomy 11/26/2014   Tobacco user 11/26/2014   Wrist fracture     Past Surgical History:  Procedure Laterality Date   bilateral tubal ligation  1996   BREAST CYST EXCISION Left 2002   FRACTURE SURGERY     KNEE SURGERY     right   LAPAROSCOPIC SUPRACERVICAL HYSTERECTOMY  08/05/2013   ORIF WRIST FRACTURE Left 01/17/2017   Procedure: OPEN REDUCTION INTERNAL FIXATION (ORIF) WRIST FRACTURE;  Surgeon: Lovell Sheehan,  MD;  Location: [facility];  Service: Orthopedics;  Laterality: Left;   RADIOLOGY WITH ANESTHESIA N/A 03/18/2020   Procedure: MRI WITH ANESTHESIA CERVICAL SPINE AND BRAIN  WITH AND WITHOUT CONTRAST;  Surgeon: Radiologist, Medication, MD;  Location: [facility];  Service: Radiology;  Laterality: N/A;   TUBAL LIGATION Bilateral    VAGINAL HYSTERECTOMY  03/2006    There were no vitals filed for this visit.   Subjective Assessment - 10/21/20 2015     Subjective Patient reports it is hot outside today but she is doing ok. No falls or LOB since last session.    Pertinent History Patient is previous patient of this therapist, was admitted to the hospital on 08/03/19 for MS exacerbation and then went to inpatient rehab on 08/08/19. Patient then received home health therapy and now is returning to outpatient PT. PMh includes HTN, SVT, neurogenic bowel, MS (1995), neuropathy, hx of wrist fx, HPV, Hypercholesteremia, adiposity, osteopenia, spasticity. She wears bilateral AFOs and uses a RW and/or wheelchair for mobility. She drives an adapted car.    Limitations Lifting;Standing;Walking;House hold activities    How long can you sit comfortably? n/a    How long can you stand comfortably? 4 minutes    How long can you walk comfortably? walked 50 ft in inpatient rehab  Patient Stated Goals return to PLOF, increase walking, get into shower, increase mobility    Currently in Pain? No/denies                 TREATMENT: Patient ambulates >56f, 1 seated rest breaks CGA and wheelchair follow for safety. Patient required min VCs for correct technique including to increase core stabilization to facilitate better hip flexion to advance limb forward. Patient demonstrates inc reliance on BUE by end of gait trial. She started exhibiting increased circumduction with LLE towards end of distance with increased fatigue reported.      Seated:  Basketball reach: arc around patient slightly outside BOS, reach for ball and  throw to hoop x 20 balls in varying positions around patient Overhead shoulder activation: [employer] dance x 2 minutes very fatiguing by end. Hand clap for coordination with upright trunk position and pertubation to stabilization x 3 minutes RTB abduction 10x RTB adduction 10x each LE one time at a time Lateral trunk leans 10x each side     Seated shoulder mid row x10 reps each UE with cues to avoid painful ROM; Finished with posterior shoulder rolls x10 reps;   Pt educated throughout session about proper posture and technique with exercises. Improved exercise technique, movement at target joints, use of target muscles after min to mod verbal, visual, tactile cues   Patient is highly motivated throughout physical therapy session despite fatigue from overheating. Utilized cryotherapy throughout session to help with reducing overheating for better tolerance. Patient is challenged with prolonged task performance with repeated muscle recruitment. The patient would benefit from continued skilled PT intervention to maximize QOL, safety, and function                     PT Education - 10/21/20 2016     Education provided Yes    Education Details exercise technique, body mechanics    Person(s) Educated Patient    Methods Explanation;Demonstration;Tactile cues;Verbal cues    Comprehension Verbalized understanding;Returned demonstration;Verbal cues required;Tactile cues required              PT Short Term Goals - 08/26/20 1115       PT SHORT TERM GOAL #1   Title Patient will be independent in home exercise program to improve strength/mobility for better functional independence with ADLs.    Baseline 5/19: HEP next session 6/22: HEP compliant 8/10 : HEP progressions compliant 8/31 HEP compliant 10/5: HEP compliant    Time 4    Period Weeks    Status Achieved    Target Date 01/13/20      PT SHORT TERM GOAL #2   Title Patient will perform a sit to stand with SUE support for  increased ind. with transfers.    Baseline 5/19: heavy BUE support 6/22: requires one hand on walker one hand on w/c 8/10: able to perform intermittently (some days can some days cannot) 8/31: attempt next session 10/5: BUE reliance, patient unable to complete 4 full stands. Attempt next session 1/25: able to perform from elevated surface only; 3/22 pt pushes through L arm off of wheelchair with R arm on RW for tactile cue/balance to perform transfer 4/21: requires use of UE's    Time 4    Period Weeks    Status On-going    Target Date 09/23/20               PT Long Term Goals - 10/12/20 0001       PT LONG  TERM GOAL #1   Title Patient will increase FOTO score to equal to or greater than  53/100   to demonstrate statistically significant improvement in mobility and quality of life.    Baseline 8/31: 48.13% 7/27:  48.1% 6/22: 46.99% 5/18: 48/100 10/5: 49/100 11/2: 48%  12/30: 46.9% 1/25: 46.99%; 07/27/2020 FOTO 42% 4/21: 47% 6/7: 44%    Time 12    Period Weeks    Status Partially Met    Target Date 11/18/20      PT LONG TERM GOAL #2   Title Patient (< 31 years old) will complete five times sit to stand test in < 20 seconds indicating an increased LE strength and improved balance.    Baseline 7/27: 26.8 seconds with one partial stand 6/22: 40. 86 sec; unable to reach full stand last two attempts 5/19: 42 seconds 10/5: BUE reliance, patient unable to complete 4 full stands. Attempt next session 11/2: 23.71 seconds 12/30: 30.32 with one near LOB due to spasms 1/25: 27.11 seconds with one hand on RW one hand on chair; 37.5 seconds with use of BUEs 4/21: 28.2 seconds 6/7: 27.67 with BUE    Time 12    Period Weeks    Status Partially Met    Target Date 11/18/20      PT LONG TERM GOAL #3   Title Patient will increase 10 meter walk test to <20 seconds as to improve gait speed for better community ambulation and to reduce fall risk.    Baseline 8/31: 35.1 seconds with RW 7/27: 36 seconds with  RW 6/22: 36.1 seconds 5/19: 42 seconds 10/5: 31.9 seconds with RW 11/2: 29.9 seconds with RW 12/30: deferred due to spasm 1/25: defer to next session 1/27: 29 seconds with RW; 07/27/2020 deferred; 07/29/2020 41.5 sec (0.24 m/s) 4/21: 32.3 seconds with RW 6/7: 38.71    Time 12    Period Weeks    Status Partially Met    Target Date 11/18/20      PT LONG TERM GOAL #4   Title Patient will reduce timed up and go to <11 seconds to reduce fall risk and demonstrate improved transfer/gait ability.    Baseline 8/31: 50.8 seconds with RW 7/27: 43.65 6/22: 45.4 seconds 5/19: 47 seconds 10/5: 38.5 seconds with RW 11/2: 35.72 seconds with RW 12/30: 43.5 seconds with wheels wet. 1/25: deferred 1/27: 38.9 seconds; 07/27/2020 82 seconds with RW, CGA; 07/29/2020 44 seconds 4/21: 38 seconds with RW 6/7: 42.31 with RW    Time 12    Period Weeks    Status Partially Met    Target Date 11/18/20      PT LONG TERM GOAL #5   Title Patient will ambulate from physical therapy gym to elevator down the hall, ride elevator to first floor, and walk to valet with RW and chair follow for increased capacity for mobility and ind.    Baseline 8/31: able to open door ~50 ft with one rest break 8/10: unable to ambulate to elevator without rest break 9/30: able to ambulate to elevator with 3 rest breaks 1/25: 181 ft with 2 seated rest break (to elevator); 07/27/2020 deferred 4/21 able to perform with one seated rest break to elevator unable to get into elevator yet 6/7: able to perform with three rest breaks    Time 12    Period Weeks    Status Partially Met    Target Date 11/18/20      PT LONG TERM GOAL #6   Title  Patient will be able to stand with RW and lift one leg onto 4" step for carryover to negotiation of steps at home.    Baseline 11/2: unable to perform 1/25: R foot 0.75 inch lift; 07/27/2020 heel not fully off ground, but pt slightly able to lift R LE to tap side of step while holding onto RW with both hands 4/21: able to  clear foot ~3/4 inch with RLE 6/7 .75 with RLE    Time 12    Period Weeks    Status Partially Met    Target Date 11/18/20                   Plan - 10/21/20 2024     Clinical Impression Statement Patient is highly motivated throughout physical therapy session despite fatigue from overheating. Utilized cryotherapy throughout session to help with reducing overheating for better tolerance. Patient is challenged with prolonged task performance with repeated muscle recruitment. The patient would benefit from continued skilled PT intervention to maximize QOL, safety, and function    Personal Factors and Comorbidities Age;Comorbidity 3+;Fitness;Past/Current Experience;Sex;Time since onset of injury/illness/exacerbation    Comorbidities HTN, SVT, neurogenic bowel, MS (1995), neuropathy, hx of wrist fx, HPV, Hypercholesteremia, adiposity, osteopenia, spasticity.    Examination-Activity Limitations Bathing;Bed Mobility;Bend;Carry;Continence;Dressing;Hygiene/Grooming;Stairs;Squat;Reach Overhead;Locomotion Level;Lift;Stand;Transfers    Examination-Participation Restrictions Church;Cleaning;Community Activity;Laundry;Volunteer;Shop;Meal Prep;Yard Work    [employer] Evolving/Moderate complexity    Rehab Potential Good    PT Frequency 2x / week    PT Duration 12 weeks    PT Treatment/Interventions ADLs/Self Care Home Management;Aquatic Therapy;Biofeedback;Cryotherapy;Electrical Stimulation;Iontophoresis 44m/ml Dexamethasone;Traction;Moist Heat;Ultrasound;DME Instruction;Gait training;Stair training;Functional mobility training;Therapeutic activities;Therapeutic exercise;Balance training;Neuromuscular re-education;Patient/family education;Orthotic Fit/Training;Wheelchair mobility training;Manual techniques;Compression bandaging;Passive range of motion;Dry needling;Energy conservation;Taping;Splinting;Vestibular    PT Next Visit Plan step negotiation, turning directions to the L  and R; re-test goals    PT Home Exercise Plan [facility] [place] Agree with Plan of Care Patient             Patient will benefit from skilled therapeutic intervention in order to improve the following deficits and impairments:  Abnormal gait, Decreased activity tolerance, Decreased balance, Decreased coordination, Decreased endurance, Decreased mobility, Decreased range of motion, Decreased strength, Difficulty walking, Impaired perceived functional ability, Impaired sensation, Impaired tone, Impaired UE functional use, Improper body mechanics, Postural dysfunction, Cardiopulmonary status limiting activity, Impaired flexibility, Increased muscle spasms  Visit Diagnosis: Muscle weakness (generalized)  Other abnormalities of gait and mobility  Unsteadiness on feet  Multiple sclerosis exacerbation (HCC)     Problem List Patient Active Problem List   Diagnosis Date Noted   Hypoalbuminemia due to protein-calorie malnutrition ([place]    Neurogenic bowel    Neurogenic bladder    Labile blood pressure    Neuropathic pain    Abscess of female pelvis    SVT (supraventricular tachycardia) (HCC)    Radial styloid tenosynovitis 03/12/2018   Wheelchair confinement 02/27/2018   Localized osteoporosis with current pathological fracture with routine healing 01/19/2017   Wrist fracture 01/16/2017   Sprain of ankle 03/23/2016   Closed fracture of lateral malleolus 03/16/2016   Health care maintenance 01/24/2016   Blood pressure elevated without history of HTN 10/25/2015   Essential hypertension 10/25/2015   Multiple sclerosis ([place] 10/02/2015   Chronic left shoulder pain 07/19/2015   Multiple sclerosis exacerbation ([facility] 07/14/2015   MS (multiple sclerosis) ([place] 11/26/2014   Increased body mass index 11/26/2014   HPV test positive 11/26/2014   Status post laparoscopic  supracervical hysterectomy 11/26/2014   Galactorrhea 11/26/2014   Back ache 05/21/2014    Adiposity 05/21/2014   Disordered sleep 05/21/2014   Muscle spasticity 05/21/2014   Spasticity 05/21/2014   Calculus of kidney 27/80/0447   Renal colic 15/80/6386   Hypercholesteremia 08/19/2013   Hereditary and idiopathic neuropathy 08/19/2013   Hypercholesterolemia without hypertriglyceridemia 08/19/2013   Bladder infection, chronic 07/25/2012   Disorder of bladder function 07/25/2012   Incomplete bladder emptying 07/25/2012   Microscopic hematuria 07/25/2012   Right upper quadrant pain 07/25/2012   Janna Arch, PT, DPT   10/21/2020, 8:26 PM  [place] MAIN [facility] SERVICES [address] [place], [place], [address] Phone: [phone]   Fax:  [phone]  Name: Lisa Williams MRN: [id number] Date of Birth: December 04, 1960

## 2020-10-26 ENCOUNTER — Other Ambulatory Visit: Payer: Self-pay

## 2020-10-26 ENCOUNTER — Ambulatory Visit: Payer: PPO

## 2020-10-26 DIAGNOSIS — M6281 Muscle weakness (generalized): Secondary | ICD-10-CM | POA: Diagnosis not present

## 2020-10-26 DIAGNOSIS — R2681 Unsteadiness on feet: Secondary | ICD-10-CM

## 2020-10-26 DIAGNOSIS — G35 Multiple sclerosis: Secondary | ICD-10-CM

## 2020-10-26 DIAGNOSIS — R2689 Other abnormalities of gait and mobility: Secondary | ICD-10-CM

## 2020-10-26 NOTE — Therapy (Signed)
Lisa Williams [facility] SERVICES [address] [place], [place], [address] Phone: [phone]   Fax:  [phone]  Physical Therapy Treatment  Patient Details  Name: Lisa Williams MRN: [id number] Date of Birth: 1960-10-16 No data recorded  Encounter Date: 10/26/2020   PT End of Session - 10/26/20 1240     Visit Number 104    Number of Visits 112    Date for PT Re-Evaluation 11/18/20    Authorization Type 4/10 PN 10/12/20    PT Start Time 1015    PT Stop Time 1059    PT Time Calculation (min) 44 min    Equipment Utilized During Treatment Gait belt;Other (comment)    Activity Tolerance Patient tolerated treatment well;Patient limited by fatigue    Behavior During Therapy [facility] for tasks assessed/performed             Past Medical History:  Diagnosis Date   Abdominal pain, right upper quadrant    Back pain    Calculus of kidney 12/09/2013   Chronic back pain    unspecified   Chronic left shoulder pain 6/96/2952   Complication of anesthesia    Functional disorder of bladder    other   Galactorrhea 11/26/2014   Chronic    Hereditary and idiopathic neuropathy 08/19/2013   HPV test positive    Hypercholesteremia 08/19/2013   Hypertension    Incomplete bladder emptying    Microscopic hematuria    MS (multiple sclerosis) (HCC)    Muscle spasticity 05/21/2014   Nonspecific findings on examination of urine    other   Osteopenia    PONV (postoperative nausea and vomiting)    Status post laparoscopic supracervical hysterectomy 11/26/2014   Tobacco user 11/26/2014   Wrist fracture     Past Surgical History:  Procedure Laterality Date   bilateral tubal ligation  1996   BREAST CYST EXCISION Left 2002   FRACTURE SURGERY     KNEE SURGERY     right   LAPAROSCOPIC SUPRACERVICAL HYSTERECTOMY  08/05/2013   ORIF WRIST FRACTURE Left 01/17/2017   Procedure: OPEN REDUCTION INTERNAL FIXATION (ORIF) WRIST FRACTURE;  Surgeon: Lovell Sheehan,  MD;  Location: [facility];  Service: Orthopedics;  Laterality: Left;   RADIOLOGY WITH ANESTHESIA N/A 03/18/2020   Procedure: MRI WITH ANESTHESIA CERVICAL SPINE AND BRAIN  WITH AND WITHOUT CONTRAST;  Surgeon: Radiologist, Medication, MD;  Location: [facility];  Service: Radiology;  Laterality: N/A;   TUBAL LIGATION Bilateral    VAGINAL HYSTERECTOMY  03/2006    There were no vitals filed for this visit.   Subjective Assessment - 10/26/20 1238     Subjective Patient reports that on Sunday she was outside for her aunt's birthday. Was unable to get back into her car without assistance, her legs would not let her stand. Thinks she overheated because after she drove home in the air conditioning she was better.    Pertinent History Patient is previous patient of this therapist, was admitted to the hospital on 08/03/19 for MS exacerbation and then went to inpatient rehab on 08/08/19. Patient then received home health therapy and now is returning to outpatient PT. PMh includes HTN, SVT, neurogenic bowel, MS (1995), neuropathy, hx of wrist fx, HPV, Hypercholesteremia, adiposity, osteopenia, spasticity. She wears bilateral AFOs and uses a RW and/or wheelchair for mobility. She drives an adapted car.    Limitations Lifting;Standing;Walking;House hold activities    How long can you sit comfortably? n/a    How  long can you stand comfortably? 4 minutes    How long can you walk comfortably? walked 50 ft in inpatient rehab    Patient Stated Goals return to PLOF, increase walking, get into shower, increase mobility    Currently in Pain? Yes    Pain Score 4     Pain Location Back    Pain Orientation Lower    Pain Descriptors / Indicators Aching    Pain Type Acute pain                        Treatment: Attempt to stand x2, terminated due to low back pain Seated: Large swiss ball forward and diagonal rollout x2 minutes Weighted ball (2000 gr):  -chest press with focus on scapular retraction  10x -straight arm raise with TrA activation 10x  -rainbow with supination and pronation 10x each side  Dynadisc:  Isometric hamstring contraction pressing through heel 10x 5 second holds Isometric hamstring contraction pressing through toes 10x 5 second holds  Seated STM with implementation of effleurage and ptrissage for spasm reduction x 11 minutes    Pt educated throughout session about proper posture and technique with exercises. Improved exercise technique, movement at target joints, use of target muscles after min to mod verbal, visual, tactile cues   Patient is in pain at initial start of session. Manual therapy reduced pain to allow patient to participate in rest of session. She is unable to perform standing interventions this session due to pain and re-exacerbation upon second attempt. She remains highly motivated for task performance despite pain. The patient would benefit from continued skilled PT intervention to maximize QOL, safety, and function        PT Education - 10/26/20 1239     Education provided Yes    Education Details exercise technique, body mechanics    Person(s) Educated Patient    Methods Explanation;Demonstration;Tactile cues;Verbal cues    Comprehension Verbalized understanding;Returned demonstration;Verbal cues required;Tactile cues required              PT Short Term Goals - 08/26/20 1115       PT SHORT TERM GOAL #1   Title Patient will be independent in home exercise program to improve strength/mobility for better functional independence with ADLs.    Baseline 5/19: HEP next session 6/22: HEP compliant 8/10 : HEP progressions compliant 8/31 HEP compliant 10/5: HEP compliant    Time 4    Period Weeks    Status Achieved    Target Date 01/13/20      PT SHORT TERM GOAL #2   Title Patient will perform a sit to stand with SUE support for increased ind. with transfers.    Baseline 5/19: heavy BUE support 6/22: requires one hand on walker one  hand on w/c 8/10: able to perform intermittently (some days can some days cannot) 8/31: attempt next session 10/5: BUE reliance, patient unable to complete 4 full stands. Attempt next session 1/25: able to perform from elevated surface only; 3/22 pt pushes through L arm off of wheelchair with R arm on RW for tactile cue/balance to perform transfer 4/21: requires use of UE's    Time 4    Period Weeks    Status On-going    Target Date 09/23/20               PT Long Term Goals - 10/12/20 0001       PT LONG TERM GOAL #1   Title Patient  will increase FOTO score to equal to or greater than  53/100   to demonstrate statistically significant improvement in mobility and quality of life.    Baseline 8/31: 48.13% 7/27:  48.1% 6/22: 46.99% 5/18: 48/100 10/5: 49/100 11/2: 48%  12/30: 46.9% 1/25: 46.99%; 07/27/2020 FOTO 42% 4/21: 47% 6/7: 44%    Time 12    Period Weeks    Status Partially Met    Target Date 11/18/20      PT LONG TERM GOAL #2   Title Patient (< 53 years old) will complete five times sit to stand test in < 20 seconds indicating an increased LE strength and improved balance.    Baseline 7/27: 26.8 seconds with one partial stand 6/22: 40. 86 sec; unable to reach full stand last two attempts 5/19: 42 seconds 10/5: BUE reliance, patient unable to complete 4 full stands. Attempt next session 11/2: 23.71 seconds 12/30: 30.32 with one near LOB due to spasms 1/25: 27.11 seconds with one hand on RW one hand on chair; 37.5 seconds with use of BUEs 4/21: 28.2 seconds 6/7: 27.67 with BUE    Time 12    Period Weeks    Status Partially Met    Target Date 11/18/20      PT LONG TERM GOAL #3   Title Patient will increase 10 meter walk test to <20 seconds as to improve gait speed for better community ambulation and to reduce fall risk.    Baseline 8/31: 35.1 seconds with RW 7/27: 36 seconds with RW 6/22: 36.1 seconds 5/19: 42 seconds 10/5: 31.9 seconds with RW 11/2: 29.9 seconds with RW 12/30:  deferred due to spasm 1/25: defer to next session 1/27: 29 seconds with RW; 07/27/2020 deferred; 07/29/2020 41.5 sec (0.24 m/s) 4/21: 32.3 seconds with RW 6/7: 38.71    Time 12    Period Weeks    Status Partially Met    Target Date 11/18/20      PT LONG TERM GOAL #4   Title Patient will reduce timed up and go to <11 seconds to reduce fall risk and demonstrate improved transfer/gait ability.    Baseline 8/31: 50.8 seconds with RW 7/27: 43.65 6/22: 45.4 seconds 5/19: 47 seconds 10/5: 38.5 seconds with RW 11/2: 35.72 seconds with RW 12/30: 43.5 seconds with wheels wet. 1/25: deferred 1/27: 38.9 seconds; 07/27/2020 82 seconds with RW, CGA; 07/29/2020 44 seconds 4/21: 38 seconds with RW 6/7: 42.31 with RW    Time 12    Period Weeks    Status Partially Met    Target Date 11/18/20      PT LONG TERM GOAL #5   Title Patient will ambulate from physical therapy gym to elevator down the hall, ride elevator to first floor, and walk to valet with RW and chair follow for increased capacity for mobility and ind.    Baseline 8/31: able to open door ~50 ft with one rest break 8/10: unable to ambulate to elevator without rest break 9/30: able to ambulate to elevator with 3 rest breaks 1/25: 181 ft with 2 seated rest break (to elevator); 07/27/2020 deferred 4/21 able to perform with one seated rest break to elevator unable to get into elevator yet 6/7: able to perform with three rest breaks    Time 12    Period Weeks    Status Partially Met    Target Date 11/18/20      PT LONG TERM GOAL #6   Title Patient will be able to stand with  RW and lift one leg onto 4" step for carryover to negotiation of steps at home.    Baseline 11/2: unable to perform 1/25: R foot 0.75 inch lift; 07/27/2020 heel not fully off ground, but pt slightly able to lift R LE to tap side of step while holding onto RW with both hands 4/21: able to clear foot ~3/4 inch with RLE 6/7 .75 with RLE    Time 12    Period Weeks    Status Partially Met     Target Date 11/18/20                   Plan - 10/26/20 1242     Clinical Impression Statement Patient is in pain at initial start of session. Manual therapy reduced pain to allow patient to participate in rest of session. She is unable to perform standing interventions this session due to pain and re-exacerbation upon second attempt. She remains highly motivated for task performance despite pain. The patient would benefit from continued skilled PT intervention to maximize QOL, safety, and function    Personal Factors and Comorbidities Age;Comorbidity 3+;Fitness;Past/Current Experience;Sex;Time since onset of injury/illness/exacerbation    Comorbidities HTN, SVT, neurogenic bowel, MS (1995), neuropathy, hx of wrist fx, HPV, Hypercholesteremia, adiposity, osteopenia, spasticity.    Examination-Activity Limitations Bathing;Bed Mobility;Bend;Carry;Continence;Dressing;Hygiene/Grooming;Stairs;Squat;Reach Overhead;Locomotion Level;Lift;Stand;Transfers    Examination-Participation Restrictions Church;Cleaning;Community Activity;Laundry;Volunteer;Shop;Meal Prep;Yard Work    [employer] Evolving/Moderate complexity    Rehab Potential Good    PT Frequency 2x / week    PT Duration 12 weeks    PT Treatment/Interventions ADLs/Self Care Home Management;Aquatic Therapy;Biofeedback;Cryotherapy;Electrical Stimulation;Iontophoresis 60m/ml Dexamethasone;Traction;Moist Heat;Ultrasound;DME Instruction;Gait training;Stair training;Functional mobility training;Therapeutic activities;Therapeutic exercise;Balance training;Neuromuscular re-education;Patient/family education;Orthotic Fit/Training;Wheelchair mobility training;Manual techniques;Compression bandaging;Passive range of motion;Dry needling;Energy conservation;Taping;Splinting;Vestibular    PT Next Visit Plan step negotiation, turning directions to the L and R; re-test goals    PT Home Exercise Plan maintain current HEP from [facility] Agree with Plan of Care Patient             Patient will benefit from skilled therapeutic intervention in order to improve the following deficits and impairments:  Abnormal gait, Decreased activity tolerance, Decreased balance, Decreased coordination, Decreased endurance, Decreased mobility, Decreased range of motion, Decreased strength, Difficulty walking, Impaired perceived functional ability, Impaired sensation, Impaired tone, Impaired UE functional use, Improper body mechanics, Postural dysfunction, Cardiopulmonary status limiting activity, Impaired flexibility, Increased muscle spasms  Visit Diagnosis: Muscle weakness (generalized)  Other abnormalities of gait and mobility  Unsteadiness on feet  Multiple sclerosis exacerbation (HCC)     Problem List Patient Active Problem List   Diagnosis Date Noted   Hypoalbuminemia due to protein-calorie malnutrition ([facility]    Neurogenic bowel    Neurogenic bladder    Labile blood pressure    Neuropathic pain    Abscess of female pelvis    SVT (supraventricular tachycardia) (HCC)    Radial styloid tenosynovitis 03/12/2018   Wheelchair confinement 02/27/2018   Localized osteoporosis with current pathological fracture with routine healing 01/19/2017   Wrist fracture 01/16/2017   Sprain of ankle 03/23/2016   Closed fracture of lateral malleolus 03/16/2016   Health care maintenance 01/24/2016   Blood pressure elevated without history of HTN 10/25/2015   Essential hypertension 10/25/2015   Multiple sclerosis ([facility] 10/02/2015   Chronic left shoulder pain 07/19/2015   Multiple sclerosis exacerbation ([facility] 07/14/2015   MS (multiple sclerosis) ([facility] 11/26/2014   Increased body mass index 11/26/2014   HPV test positive  11/26/2014   Status post laparoscopic supracervical hysterectomy 11/26/2014   Galactorrhea 11/26/2014   Back ache 05/21/2014   Adiposity 05/21/2014   Disordered sleep 05/21/2014   Muscle spasticity  05/21/2014   Spasticity 05/21/2014   Calculus of kidney 92/49/3241   Renal colic 99/14/4458   Hypercholesteremia 08/19/2013   Hereditary and idiopathic neuropathy 08/19/2013   Hypercholesterolemia without hypertriglyceridemia 08/19/2013   Bladder infection, chronic 07/25/2012   Disorder of bladder function 07/25/2012   Incomplete bladder emptying 07/25/2012   Microscopic hematuria 07/25/2012   Right upper quadrant pain 07/25/2012   Janna Arch, PT, DPT  10/26/2020, 12:43 PM  [place] [facility] SERVICES [address] [place], [place], [address] Phone: [phone]   Fax:  [phone]  Name: Adonai Helzer Geyer MRN: [id number] Date of Birth: March 11, 1961

## 2020-10-28 ENCOUNTER — Ambulatory Visit: Payer: PPO

## 2020-10-28 ENCOUNTER — Other Ambulatory Visit: Payer: Self-pay

## 2020-10-28 DIAGNOSIS — R2689 Other abnormalities of gait and mobility: Secondary | ICD-10-CM

## 2020-10-28 DIAGNOSIS — M6281 Muscle weakness (generalized): Secondary | ICD-10-CM

## 2020-10-28 DIAGNOSIS — G35 Multiple sclerosis: Secondary | ICD-10-CM

## 2020-10-28 DIAGNOSIS — R2681 Unsteadiness on feet: Secondary | ICD-10-CM

## 2020-10-28 NOTE — Therapy (Signed)
[place] MAIN [facility] SERVICES [address] [place], [place], [address] Phone: [phone]   Fax:  [phone]  Physical Therapy Treatment  Patient Details  Name: Lisa Williams MRN: [id number] Date of Birth: Dec 02, 1960 No data recorded  Encounter Date: 10/28/2020   PT End of Session - 10/28/20 1242     Visit Number 105    Number of Visits 112    Date for PT Re-Evaluation 11/18/20    Authorization Type 5/10 PN 10/12/20    PT Start Time 1016    PT Stop Time 1059    PT Time Calculation (min) 43 min    Equipment Utilized During Treatment Gait belt;Other (comment)    Activity Tolerance Patient tolerated treatment well;Patient limited by fatigue    Behavior During Therapy [facility] for tasks assessed/performed             Past Medical History:  Diagnosis Date   Abdominal pain, right upper quadrant    Back pain    Calculus of kidney 12/09/2013   Chronic back pain    unspecified   Chronic left shoulder pain 08/29/5359   Complication of anesthesia    Functional disorder of bladder    other   Galactorrhea 11/26/2014   Chronic    Hereditary and idiopathic neuropathy 08/19/2013   HPV test positive    Hypercholesteremia 08/19/2013   Hypertension    Incomplete bladder emptying    Microscopic hematuria    MS (multiple sclerosis) (HCC)    Muscle spasticity 05/21/2014   Nonspecific findings on examination of urine    other   Osteopenia    PONV (postoperative nausea and vomiting)    Status post laparoscopic supracervical hysterectomy 11/26/2014   Tobacco user 11/26/2014   Wrist fracture     Past Surgical History:  Procedure Laterality Date   bilateral tubal ligation  1996   BREAST CYST EXCISION Left 2002   FRACTURE SURGERY     KNEE SURGERY     right   LAPAROSCOPIC SUPRACERVICAL HYSTERECTOMY  08/05/2013   ORIF WRIST FRACTURE Left 01/17/2017   Procedure: OPEN REDUCTION INTERNAL FIXATION (ORIF) WRIST FRACTURE;  Surgeon: Lovell Sheehan,  MD;  Location: [facility];  Service: Orthopedics;  Laterality: Left;   RADIOLOGY WITH ANESTHESIA N/A 03/18/2020   Procedure: MRI WITH ANESTHESIA CERVICAL SPINE AND BRAIN  WITH AND WITHOUT CONTRAST;  Surgeon: Radiologist, Medication, MD;  Location: [facility];  Service: Radiology;  Laterality: N/A;   TUBAL LIGATION Bilateral    VAGINAL HYSTERECTOMY  03/2006    There were no vitals filed for this visit.   Subjective Assessment - 10/28/20 1238     Subjective Patient reports she is doing well today. No falls or LOB since last session.    Pertinent History Patient is previous patient of this therapist, was admitted to the hospital on 08/03/19 for MS exacerbation and then went to inpatient rehab on 08/08/19. Patient then received home health therapy and now is returning to outpatient PT. PMh includes HTN, SVT, neurogenic bowel, MS (1995), neuropathy, hx of wrist fx, HPV, Hypercholesteremia, adiposity, osteopenia, spasticity. She wears bilateral AFOs and uses a RW and/or wheelchair for mobility. She drives an adapted car.    Limitations Lifting;Standing;Walking;House hold activities    How long can you sit comfortably? n/a    How long can you stand comfortably? 4 minutes    How long can you walk comfortably? walked 50 ft in inpatient rehab    Patient Stated Goals return to  PLOF, increase walking, get into shower, increase mobility    Currently in Pain? No/denies              ambulation:  Incline negotiation training with RW and wheelchair follow, cue for trunk positioning for optimal stabilization, ambulate 45 ft with good foot clearance.  Incline negotiation: turning left with RW with cues for trunk positioning to mimic car transfer: patient able to perform safely.  Ambulate in flat hallway 52 ft with RW and CGA with wheelchair follow, Cues for shift and lift for LE clearance, one seated rest break.   Seated.  Seated boxing: cues for sequencing, body mechanics, and pace/rhythm for functional  contraction and timing of muscle recruitment: -Cross body punches to mitts on PT hands with no back support for focused core stabilization with crossing midline 2x 60 seconds  -Upper cut to mitts in PT hands for core activation without back support with perturbations 60 seconds x 2 trials  -cross body elbow 10x each side; x 2 sets   Forward/backwards trunk lean for core stabilization 10x Lateral L/R trunk lean for core stabilization 10x each side  Ice pack utilized for symptom exacerbation reduction.    Pt educated throughout session about proper posture and technique with exercises. Improved exercise technique, movement at target joints, use of target muscles after min to mod verbal, visual, tactile cues  Patient presents to physical therapy with excellent motivation. She is able to perform incline negotiation safely with cueing required for trunk position for optimal stabilization. Patient additionally tolerated trunk stabilization interventions well with fatigue but no pain. The patient would benefit from continued skilled PT intervention to maximize QOL, safety, and function                        PT Education - 10/28/20 1239     Education provided Yes    Education Details exercise technique, incline negotiation    Person(s) Educated Patient    Methods Explanation;Demonstration;Tactile cues;Verbal cues    Comprehension Verbalized understanding;Returned demonstration;Verbal cues required;Tactile cues required              PT Short Term Goals - 08/26/20 1115       PT SHORT TERM GOAL #1   Title Patient will be independent in home exercise program to improve strength/mobility for better functional independence with ADLs.    Baseline 5/19: HEP next session 6/22: HEP compliant 8/10 : HEP progressions compliant 8/31 HEP compliant 10/5: HEP compliant    Time 4    Period Weeks    Status Achieved    Target Date 01/13/20      PT SHORT TERM GOAL #2   Title  Patient will perform a sit to stand with SUE support for increased ind. with transfers.    Baseline 5/19: heavy BUE support 6/22: requires one hand on walker one hand on w/c 8/10: able to perform intermittently (some days can some days cannot) 8/31: attempt next session 10/5: BUE reliance, patient unable to complete 4 full stands. Attempt next session 1/25: able to perform from elevated surface only; 3/22 pt pushes through L arm off of wheelchair with R arm on RW for tactile cue/balance to perform transfer 4/21: requires use of UE's    Time 4    Period Weeks    Status On-going    Target Date 09/23/20               PT Long Term Goals - 10/12/20 0001  PT LONG TERM GOAL #1   Title Patient will increase FOTO score to equal to or greater than  53/100   to demonstrate statistically significant improvement in mobility and quality of life.    Baseline 8/31: 48.13% 7/27:  48.1% 6/22: 46.99% 5/18: 48/100 10/5: 49/100 11/2: 48%  12/30: 46.9% 1/25: 46.99%; 07/27/2020 FOTO 42% 4/21: 47% 6/7: 44%    Time 12    Period Weeks    Status Partially Met    Target Date 11/18/20      PT LONG TERM GOAL #2   Title Patient (< 42 years old) will complete five times sit to stand test in < 20 seconds indicating an increased LE strength and improved balance.    Baseline 7/27: 26.8 seconds with one partial stand 6/22: 40. 86 sec; unable to reach full stand last two attempts 5/19: 42 seconds 10/5: BUE reliance, patient unable to complete 4 full stands. Attempt next session 11/2: 23.71 seconds 12/30: 30.32 with one near LOB due to spasms 1/25: 27.11 seconds with one hand on RW one hand on chair; 37.5 seconds with use of BUEs 4/21: 28.2 seconds 6/7: 27.67 with BUE    Time 12    Period Weeks    Status Partially Met    Target Date 11/18/20      PT LONG TERM GOAL #3   Title Patient will increase 10 meter walk test to <20 seconds as to improve gait speed for better community ambulation and to reduce fall risk.     Baseline 8/31: 35.1 seconds with RW 7/27: 36 seconds with RW 6/22: 36.1 seconds 5/19: 42 seconds 10/5: 31.9 seconds with RW 11/2: 29.9 seconds with RW 12/30: deferred due to spasm 1/25: defer to next session 1/27: 29 seconds with RW; 07/27/2020 deferred; 07/29/2020 41.5 sec (0.24 m/s) 4/21: 32.3 seconds with RW 6/7: 38.71    Time 12    Period Weeks    Status Partially Met    Target Date 11/18/20      PT LONG TERM GOAL #4   Title Patient will reduce timed up and go to <11 seconds to reduce fall risk and demonstrate improved transfer/gait ability.    Baseline 8/31: 50.8 seconds with RW 7/27: 43.65 6/22: 45.4 seconds 5/19: 47 seconds 10/5: 38.5 seconds with RW 11/2: 35.72 seconds with RW 12/30: 43.5 seconds with wheels wet. 1/25: deferred 1/27: 38.9 seconds; 07/27/2020 82 seconds with RW, CGA; 07/29/2020 44 seconds 4/21: 38 seconds with RW 6/7: 42.31 with RW    Time 12    Period Weeks    Status Partially Met    Target Date 11/18/20      PT LONG TERM GOAL #5   Title Patient will ambulate from physical therapy gym to elevator down the hall, ride elevator to first floor, and walk to valet with RW and chair follow for increased capacity for mobility and ind.    Baseline 8/31: able to open door ~50 ft with one rest break 8/10: unable to ambulate to elevator without rest break 9/30: able to ambulate to elevator with 3 rest breaks 1/25: 181 ft with 2 seated rest break (to elevator); 07/27/2020 deferred 4/21 able to perform with one seated rest break to elevator unable to get into elevator yet 6/7: able to perform with three rest breaks    Time 12    Period Weeks    Status Partially Met    Target Date 11/18/20      PT LONG TERM GOAL #6  Title Patient will be able to stand with RW and lift one leg onto 4" step for carryover to negotiation of steps at home.    Baseline 11/2: unable to perform 1/25: R foot 0.75 inch lift; 07/27/2020 heel not fully off ground, but pt slightly able to lift R LE to tap side of  step while holding onto RW with both hands 4/21: able to clear foot ~3/4 inch with RLE 6/7 .75 with RLE    Time 12    Period Weeks    Status Partially Met    Target Date 11/18/20                   Plan - 10/28/20 1244     Clinical Impression Statement Patient presents to physical therapy with excellent motivation. She is able to perform incline negotiation safely with cueing required for trunk position for optimal stabilization. Patient additionally tolerated trunk stabilization interventions well with fatigue but no pain. The patient would benefit from continued skilled PT intervention to maximize QOL, safety, and function    Personal Factors and Comorbidities Age;Comorbidity 3+;Fitness;Past/Current Experience;Sex;Time since onset of injury/illness/exacerbation    Comorbidities HTN, SVT, neurogenic bowel, MS (1995), neuropathy, hx of wrist fx, HPV, Hypercholesteremia, adiposity, osteopenia, spasticity.    Examination-Activity Limitations Bathing;Bed Mobility;Bend;Carry;Continence;Dressing;Hygiene/Grooming;Stairs;Squat;Reach Overhead;Locomotion Level;Lift;Stand;Transfers    Examination-Participation Restrictions Church;Cleaning;Community Activity;Laundry;Volunteer;Shop;Meal Prep;Yard Work    [employer] Evolving/Moderate complexity    Rehab Potential Good    PT Frequency 2x / week    PT Duration 12 weeks    PT Treatment/Interventions ADLs/Self Care Home Management;Aquatic Therapy;Biofeedback;Cryotherapy;Electrical Stimulation;Iontophoresis 39m/ml Dexamethasone;Traction;Moist Heat;Ultrasound;DME Instruction;Gait training;Stair training;Functional mobility training;Therapeutic activities;Therapeutic exercise;Balance training;Neuromuscular re-education;Patient/family education;Orthotic Fit/Training;Wheelchair mobility training;Manual techniques;Compression bandaging;Passive range of motion;Dry needling;Energy conservation;Taping;Splinting;Vestibular    PT Next Visit  Plan step negotiation, turning directions to the L and R; re-test goals    PT Home Exercise Plan maintain current HEP from [facility] Agree with Plan of Care Patient             Patient will benefit from skilled therapeutic intervention in order to improve the following deficits and impairments:  Abnormal gait, Decreased activity tolerance, Decreased balance, Decreased coordination, Decreased endurance, Decreased mobility, Decreased range of motion, Decreased strength, Difficulty walking, Impaired perceived functional ability, Impaired sensation, Impaired tone, Impaired UE functional use, Improper body mechanics, Postural dysfunction, Cardiopulmonary status limiting activity, Impaired flexibility, Increased muscle spasms  Visit Diagnosis: Muscle weakness (generalized)  Unsteadiness on feet  Other abnormalities of gait and mobility  Multiple sclerosis exacerbation (HCC)     Problem List Patient Active Problem List   Diagnosis Date Noted   Hypoalbuminemia due to protein-calorie malnutrition (HCC)    Neurogenic bowel    Neurogenic bladder    Labile blood pressure    Neuropathic pain    Abscess of female pelvis    SVT (supraventricular tachycardia) (HCC)    Radial styloid tenosynovitis 03/12/2018   Wheelchair confinement 02/27/2018   Localized osteoporosis with current pathological fracture with routine healing 01/19/2017   Wrist fracture 01/16/2017   Sprain of ankle 03/23/2016   Closed fracture of lateral malleolus 03/16/2016   Health care maintenance 01/24/2016   Blood pressure elevated without history of HTN 10/25/2015   Essential hypertension 10/25/2015   Multiple sclerosis ([place] 10/02/2015   Chronic left shoulder pain 07/19/2015   [place] exacerbation ([place] 07/14/2015   [place] (multiple sclerosis) ([place] 11/26/2014   Increased body mass index 11/26/2014   HPV test positive 11/26/2014  Status post laparoscopic supracervical hysterectomy 11/26/2014    Galactorrhea 11/26/2014   Back ache 05/21/2014   Adiposity 05/21/2014   Disordered sleep 05/21/2014   Muscle spasticity 05/21/2014   Spasticity 05/21/2014   Calculus of kidney 97/41/6384   Renal colic 53/64/6803   Hypercholesteremia 08/19/2013   Hereditary and idiopathic neuropathy 08/19/2013   Hypercholesterolemia without hypertriglyceridemia 08/19/2013   Bladder infection, chronic 07/25/2012   Disorder of bladder function 07/25/2012   Incomplete bladder emptying 07/25/2012   Microscopic hematuria 07/25/2012   Right upper quadrant pain 07/25/2012   Janna Arch, PT, DPT  10/28/2020, 12:45 PM  [place] MAIN [facility] SERVICES [address] [place], [place], [address] Phone: [phone]   Fax:  [phone]  Name: Lisa Williams MRN: [id number] Date of Birth: 10-Feb-1961

## 2020-11-01 ENCOUNTER — Telehealth: Payer: Self-pay | Admitting: Neurology

## 2020-11-01 ENCOUNTER — Other Ambulatory Visit: Payer: Self-pay | Admitting: *Deleted

## 2020-11-01 DIAGNOSIS — G35 Multiple sclerosis: Secondary | ICD-10-CM

## 2020-11-01 DIAGNOSIS — M62838 Other muscle spasm: Secondary | ICD-10-CM

## 2020-11-01 DIAGNOSIS — Z5181 Encounter for therapeutic drug level monitoring: Secondary | ICD-10-CM

## 2020-11-01 MED ORDER — TIZANIDINE HCL 2 MG PO TABS: ORAL_TABLET | ORAL | 1 refills | Status: DC

## 2020-11-01 NOTE — Telephone Encounter (Signed)
I called the patient, I got a computer reminder to recheck the blood work to include a liver profile, mild elevation noted previously on [place].  The patient wishes the blood work to be done at [facility] in [place].

## 2020-11-01 NOTE — Telephone Encounter (Signed)
I spoke to the patient. She uses the [facility] at [place] on [address] in [place].  Orders printed, signed by Dr. Epimenio Foot, faxed and confirmed to that location.  Ph: [phone] Fax: [phone]

## 2020-11-02 ENCOUNTER — Ambulatory Visit: Payer: PPO

## 2020-11-02 ENCOUNTER — Other Ambulatory Visit: Payer: Self-pay | Admitting: Neurology

## 2020-11-02 ENCOUNTER — Other Ambulatory Visit: Payer: Self-pay

## 2020-11-02 DIAGNOSIS — Z5181 Encounter for therapeutic drug level monitoring: Secondary | ICD-10-CM | POA: Diagnosis not present

## 2020-11-02 DIAGNOSIS — R2689 Other abnormalities of gait and mobility: Secondary | ICD-10-CM

## 2020-11-02 DIAGNOSIS — R2681 Unsteadiness on feet: Secondary | ICD-10-CM

## 2020-11-02 DIAGNOSIS — M6281 Muscle weakness (generalized): Secondary | ICD-10-CM

## 2020-11-02 NOTE — Therapy (Addendum)
[place] MAIN [facility] SERVICES [place], [place], [address] Phone: [phone]   Fax:  [phone]  Physical Therapy Treatment  Patient Details  Name: Lisa Williams MRN: [id number] Date of Birth: [birth year] No data recorded  Encounter Date: 11/02/2020   PT End of Session - 11/02/20 1006     Visit Number 106    Number of Visits 112    Date for PT Re-Evaluation 11/18/20    Authorization Type 6/10 PN 10/12/20    PT Start Time 1015    PT Stop Time 1059    PT Time Calculation (min) 44 min    Equipment Utilized During Treatment Gait belt;Other (comment)    Activity Tolerance Patient tolerated treatment well;Patient limited by fatigue    Behavior During Therapy [facility] for tasks assessed/performed             Past Medical History:  Diagnosis Date   Abdominal pain, right upper quadrant    Back pain    Calculus of kidney 12/09/2013   Chronic back pain    unspecified   Chronic left shoulder pain 6/62/9476   Complication of anesthesia    Functional disorder of bladder    other   Galactorrhea 11/26/2014   Chronic    Hereditary and idiopathic neuropathy 08/19/2013   HPV test positive    Hypercholesteremia 08/19/2013   Hypertension    Incomplete bladder emptying    Microscopic hematuria    MS (multiple sclerosis) (HCC)    Muscle spasticity 05/21/2014   Nonspecific findings on examination of urine    other   Osteopenia    PONV (postoperative nausea and vomiting)    Status post laparoscopic supracervical hysterectomy 11/26/2014   Tobacco user 11/26/2014   Wrist fracture     Past Surgical History:  Procedure Laterality Date   bilateral tubal ligation  1996   BREAST CYST EXCISION Left 2002   FRACTURE SURGERY     KNEE SURGERY     right   LAPAROSCOPIC SUPRACERVICAL HYSTERECTOMY  08/05/2013   ORIF WRIST FRACTURE Left 01/17/2017   Procedure: OPEN REDUCTION INTERNAL FIXATION (ORIF) WRIST FRACTURE;  Surgeon: Lovell Sheehan,  MD;  Location: [facility];  Service: Orthopedics;  Laterality: Left;   RADIOLOGY WITH ANESTHESIA N/A 03/18/2020   Procedure: MRI WITH ANESTHESIA CERVICAL SPINE AND BRAIN  WITH AND WITHOUT CONTRAST;  Surgeon: Radiologist, Medication, MD;  Location: [facility];  Service: Radiology;  Laterality: N/A;   TUBAL LIGATION Bilateral    VAGINAL HYSTERECTOMY  03/2006    There were no vitals filed for this visit.   Subjective Assessment - 11/02/20 1158     Subjective Patient has fasted due to giving blood prior to PT session.    Pertinent History Patient is previous patient of this therapist, was admitted to the hospital on 08/03/19 for MS exacerbation and then went to inpatient rehab on 08/08/19. Patient then received home health therapy and now is returning to outpatient PT. PMh includes HTN, SVT, neurogenic bowel, MS (1995), neuropathy, hx of wrist fx, HPV, Hypercholesteremia, adiposity, osteopenia, spasticity. She wears bilateral AFOs and uses a RW and/or wheelchair for mobility. She drives an adapted car.    Limitations Lifting;Standing;Walking;House hold activities    How long can you sit comfortably? n/a    How long can you stand comfortably? 4 minutes    How long can you walk comfortably? walked 50 ft in inpatient rehab    Patient Stated Goals return to PLOF, increase walking,  get into shower, increase mobility    Currently in Pain? No/denies                   Standing: Unable to perform this session.    Seated.  RTB adduction 10x each LE; able to perform with upright posture without UE support RTB abduction bilateral LE 10x;  Dynadisc: hamstring isometric 10x through heel for 3 second holds, 10x through toe with 3 second holds GTB ER: 10x neutral grip, 10x overhand grip, 10x underhand grip Shoulder abduction with opposite shoulder in abduction 7x each UE, replicated with flexion 7x each UE; patient reports challenging Pectoral stretch with trunk extension 10x Forward/backwards trunk lean  for core stabilization 10x Lateral L/R trunk lean for core stabilization 10x each side Modified windmill 10x each side; very fatiguing for patient    Ice pack utilized for symptom exacerbation reduction.      Pt educated throughout session about proper posture and technique with exercises. Improved exercise technique, movement at target joints, use of target muscles after min to mod verbal, visual, tactile cues    Patient is slightly weakened today due to fasting and blood draw prior to PT session requiring session to be performed in seated. Patient is highly motivated despite her fatigue and eager to perform all interventions. Rest breaks given between interventions to allow for patient to have reduced symptoms. The patient would benefit from continued skilled PT intervention to maximize QOL, safety, and function                 PT Education - 11/02/20 1005     Education provided Yes    Education Details exercise technique    Person(s) Educated Patient    Methods Explanation;Demonstration;Tactile cues;Verbal cues    Comprehension Verbalized understanding;Returned demonstration;Verbal cues required;Tactile cues required              PT Short Term Goals - 08/26/20 1115       PT SHORT TERM GOAL #1   Title Patient will be independent in home exercise program to improve strength/mobility for better functional independence with ADLs.    Baseline 5/19: HEP next session 6/22: HEP compliant 8/10 : HEP progressions compliant 8/31 HEP compliant 10/5: HEP compliant    Time 4    Period Weeks    Status Achieved    Target Date 01/13/20      PT SHORT TERM GOAL #2   Title Patient will perform a sit to stand with SUE support for increased ind. with transfers.    Baseline 5/19: heavy BUE support 6/22: requires one hand on walker one hand on w/c 8/10: able to perform intermittently (some days can some days cannot) 8/31: attempt next session 10/5: BUE reliance, patient unable to  complete 4 full stands. Attempt next session 1/25: able to perform from elevated surface only; 3/22 pt pushes through L arm off of wheelchair with R arm on RW for tactile cue/balance to perform transfer 4/21: requires use of UE's    Time 4    Period Weeks    Status On-going    Target Date 09/23/20               PT Long Term Goals - 10/12/20 0001       PT LONG TERM GOAL #1   Title Patient will increase FOTO score to equal to or greater than  53/100   to demonstrate statistically significant improvement in mobility and quality of life.    Baseline 8/31:  48.13% 7/27:  48.1% 6/22: 46.99% 5/18: 48/100 10/5: 49/100 11/2: 48%  12/30: 46.9% 1/25: 46.99%; 07/27/2020 FOTO 42% 4/21: 47% 6/7: 44%    Time 12    Period Weeks    Status Partially Met    Target Date 11/18/20      PT LONG TERM GOAL #2   Title Patient (< 71 years old) will complete five times sit to stand test in < 20 seconds indicating an increased LE strength and improved balance.    Baseline 7/27: 26.8 seconds with one partial stand 6/22: 40. 86 sec; unable to reach full stand last two attempts 5/19: 42 seconds 10/5: BUE reliance, patient unable to complete 4 full stands. Attempt next session 11/2: 23.71 seconds 12/30: 30.32 with one near LOB due to spasms 1/25: 27.11 seconds with one hand on RW one hand on chair; 37.5 seconds with use of BUEs 4/21: 28.2 seconds 6/7: 27.67 with BUE    Time 12    Period Weeks    Status Partially Met    Target Date 11/18/20      PT LONG TERM GOAL #3   Title Patient will increase 10 meter walk test to <20 seconds as to improve gait speed for better community ambulation and to reduce fall risk.    Baseline 8/31: 35.1 seconds with RW 7/27: 36 seconds with RW 6/22: 36.1 seconds 5/19: 42 seconds 10/5: 31.9 seconds with RW 11/2: 29.9 seconds with RW 12/30: deferred due to spasm 1/25: defer to next session 1/27: 29 seconds with RW; 07/27/2020 deferred; 07/29/2020 41.5 sec (0.24 m/s) 4/21: 32.3 seconds with RW  6/7: 38.71    Time 12    Period Weeks    Status Partially Met    Target Date 11/18/20      PT LONG TERM GOAL #4   Title Patient will reduce timed up and go to <11 seconds to reduce fall risk and demonstrate improved transfer/gait ability.    Baseline 8/31: 50.8 seconds with RW 7/27: 43.65 6/22: 45.4 seconds 5/19: 47 seconds 10/5: 38.5 seconds with RW 11/2: 35.72 seconds with RW 12/30: 43.5 seconds with wheels wet. 1/25: deferred 1/27: 38.9 seconds; 07/27/2020 82 seconds with RW, CGA; 07/29/2020 44 seconds 4/21: 38 seconds with RW 6/7: 42.31 with RW    Time 12    Period Weeks    Status Partially Met    Target Date 11/18/20      PT LONG TERM GOAL #5   Title Patient will ambulate from physical therapy gym to elevator down the hall, ride elevator to first floor, and walk to valet with RW and chair follow for increased capacity for mobility and ind.    Baseline 8/31: able to open door ~50 ft with one rest break 8/10: unable to ambulate to elevator without rest break 9/30: able to ambulate to elevator with 3 rest breaks 1/25: 181 ft with 2 seated rest break (to elevator); 07/27/2020 deferred 4/21 able to perform with one seated rest break to elevator unable to get into elevator yet 6/7: able to perform with three rest breaks    Time 12    Period Weeks    Status Partially Met    Target Date 11/18/20      PT LONG TERM GOAL #6   Title Patient will be able to stand with RW and lift one leg onto 4" step for carryover to negotiation of steps at home.    Baseline 11/2: unable to perform 1/25: R foot 0.75 inch lift;  07/27/2020 heel not fully off ground, but pt slightly able to lift R LE to tap side of step while holding onto RW with both hands 4/21: able to clear foot ~3/4 inch with RLE 6/7 .75 with RLE    Time 12    Period Weeks    Status Partially Met    Target Date 11/18/20                   Plan - 11/02/20 1159     Clinical Impression Statement Patient is slightly weakened today due to  fasting and blood draw prior to PT session requiring session to be performed in seated. Patient is highly motivated despite her fatigue and eager to perform all interventions. Rest breaks given between interventions to allow for patient to have reduced symptoms. The patient would benefit from continued skilled PT intervention to maximize QOL, safety, and function    Personal Factors and Comorbidities Age;Comorbidity 3+;Fitness;Past/Current Experience;Sex;Time since onset of injury/illness/exacerbation    Comorbidities HTN, SVT, neurogenic bowel, MS (1995), neuropathy, hx of wrist fx, HPV, Hypercholesteremia, adiposity, osteopenia, spasticity.    Examination-Activity Limitations Bathing;Bed Mobility;Bend;Carry;Continence;Dressing;Hygiene/Grooming;Stairs;Squat;Reach Overhead;Locomotion Level;Lift;Stand;Transfers    Examination-Participation Restrictions Church;Cleaning;Community Activity;Laundry;Volunteer;Shop;Meal Prep;Yard Work    [employer] Evolving/Moderate complexity    Rehab Potential Good    PT Frequency 2x / week    PT Duration 12 weeks    PT Treatment/Interventions ADLs/Self Care Home Management;Aquatic Therapy;Biofeedback;Cryotherapy;Electrical Stimulation;Iontophoresis 58m/ml Dexamethasone;Traction;Moist Heat;Ultrasound;DME Instruction;Gait training;Stair training;Functional mobility training;Therapeutic activities;Therapeutic exercise;Balance training;Neuromuscular re-education;Patient/family education;Orthotic Fit/Training;Wheelchair mobility training;Manual techniques;Compression bandaging;Passive range of motion;Dry needling;Energy conservation;Taping;Splinting;Vestibular    PT Next Visit Plan step negotiation, turning directions to the L and R; re-test goals    PT Home Exercise Plan maintain current HEP from [facility] Agree with Plan of Care Patient             Patient will benefit from skilled therapeutic intervention in order to improve the  following deficits and impairments:  Abnormal gait, Decreased activity tolerance, Decreased balance, Decreased coordination, Decreased endurance, Decreased mobility, Decreased range of motion, Decreased strength, Difficulty walking, Impaired perceived functional ability, Impaired sensation, Impaired tone, Impaired UE functional use, Improper body mechanics, Postural dysfunction, Cardiopulmonary status limiting activity, Impaired flexibility, Increased muscle spasms  Visit Diagnosis: Muscle weakness (generalized)  Unsteadiness on feet  Other abnormalities of gait and mobility     Problem List Patient Active Problem List   Diagnosis Date Noted   Hypoalbuminemia due to protein-calorie malnutrition (HCC)    Neurogenic bowel    Neurogenic bladder    Labile blood pressure    Neuropathic pain    Abscess of female pelvis    SVT (supraventricular tachycardia) (HCC)    Radial styloid tenosynovitis 03/12/2018   Wheelchair confinement 02/27/2018   Localized osteoporosis with current pathological fracture with routine healing 01/19/2017   Wrist fracture 01/16/2017   Sprain of ankle 03/23/2016   Closed fracture of lateral malleolus 03/16/2016   Health care maintenance 01/24/2016   Blood pressure elevated without history of HTN 10/25/2015   Essential hypertension 10/25/2015   Multiple sclerosis ([facility] 10/02/2015   Chronic left shoulder pain 07/19/2015   Multiple sclerosis exacerbation ([facility] 07/14/2015   MS (multiple sclerosis) ([facility] 11/26/2014   Increased body mass index 11/26/2014   HPV test positive 11/26/2014   Status post laparoscopic supracervical hysterectomy 11/26/2014   Galactorrhea 11/26/2014   Back ache 05/21/2014   Adiposity 05/21/2014   Disordered sleep 05/21/2014   Muscle spasticity 05/21/2014   Spasticity  05/21/2014   Calculus of kidney 81/27/5170   Renal colic 01/74/9449   Hypercholesteremia 08/19/2013   Hereditary and idiopathic neuropathy 08/19/2013    Hypercholesterolemia without hypertriglyceridemia 08/19/2013   Bladder infection, chronic 07/25/2012   Disorder of bladder function 07/25/2012   Incomplete bladder emptying 07/25/2012   Microscopic hematuria 07/25/2012   Right upper quadrant pain 07/25/2012    Janna Arch 11/03/2020, 2:06 PM  [place] MAIN [facility] SERVICES [address] [place], [place], [address] Phone: [phone]   Fax:  [phone]  Name: Lisa Williams MRN: [id number] Date of Birth: 21-Nov-1960

## 2020-11-03 ENCOUNTER — Encounter: Payer: Self-pay | Admitting: Hematology and Oncology

## 2020-11-03 LAB — COMPREHENSIVE METABOLIC PANEL
ALT: 66 IU/L — ABNORMAL HIGH (ref 0–32)
AST: 39 IU/L (ref 0–40)
Albumin/Globulin Ratio: 1.6 (ref 1.2–2.2)
Albumin: 3.9 g/dL (ref 3.8–4.9)
Alkaline Phosphatase: 134 IU/L — ABNORMAL HIGH (ref 44–121)
BUN/Creatinine Ratio: 30 — ABNORMAL HIGH (ref 9–23)
BUN: 22 mg/dL (ref 6–24)
Bilirubin Total: 0.6 mg/dL (ref 0.0–1.2)
CO2: 23 mmol/L (ref 20–29)
Calcium: 9.1 mg/dL (ref 8.7–10.2)
Chloride: 107 mmol/L — ABNORMAL HIGH (ref 96–106)
Creatinine, Ser: 0.73 mg/dL (ref 0.57–1.00)
Globulin, Total: 2.4 g/dL (ref 1.5–4.5)
Glucose: 96 mg/dL (ref 65–99)
Potassium: 3.9 mmol/L (ref 3.5–5.2)
Sodium: 143 mmol/L (ref 134–144)
Total Protein: 6.3 g/dL (ref 6.0–8.5)
eGFR: 95 mL/min/{1.73_m2} (ref >59)

## 2020-11-04 ENCOUNTER — Other Ambulatory Visit: Payer: Self-pay

## 2020-11-04 ENCOUNTER — Ambulatory Visit: Payer: PPO

## 2020-11-04 DIAGNOSIS — M6281 Muscle weakness (generalized): Secondary | ICD-10-CM | POA: Diagnosis not present

## 2020-11-04 DIAGNOSIS — G35 Multiple sclerosis: Secondary | ICD-10-CM

## 2020-11-04 DIAGNOSIS — R2689 Other abnormalities of gait and mobility: Secondary | ICD-10-CM

## 2020-11-04 DIAGNOSIS — R2681 Unsteadiness on feet: Secondary | ICD-10-CM

## 2020-11-04 NOTE — Therapy (Signed)
[place] MAIN [facility] SERVICES [address] [place], [place], [address] Phone: [phone]   Fax:  [phone]  Physical Therapy Treatment  Patient Details  Name: Lisa Williams MRN: [id number] Date of Birth: Oct 19, 1960 No data recorded  Encounter Date: 11/04/2020   PT End of Session - 11/04/20 1222     Visit Number 107    Number of Visits 112    Date for PT Re-Evaluation 11/18/20    Authorization Type 7/10 PN 10/12/20    PT Start Time 1015    PT Stop Time 1059    PT Time Calculation (min) 44 min    Equipment Utilized During Treatment Gait belt;Other (comment)    Activity Tolerance Patient tolerated treatment well;Patient limited by fatigue    Behavior During Therapy [facility] for tasks assessed/performed             Past Medical History:  Diagnosis Date   Abdominal pain, right upper quadrant    Back pain    Calculus of kidney 12/09/2013   Chronic back pain    unspecified   Chronic left shoulder pain 10/22/735   Complication of anesthesia    Functional disorder of bladder    other   Galactorrhea 11/26/2014   Chronic    Hereditary and idiopathic neuropathy 08/19/2013   HPV test positive    Hypercholesteremia 08/19/2013   Hypertension    Incomplete bladder emptying    Microscopic hematuria    MS (multiple sclerosis) (HCC)    Muscle spasticity 05/21/2014   Nonspecific findings on examination of urine    other   Osteopenia    PONV (postoperative nausea and vomiting)    Status post laparoscopic supracervical hysterectomy 11/26/2014   Tobacco user 11/26/2014   Wrist fracture     Past Surgical History:  Procedure Laterality Date   bilateral tubal ligation  1996   BREAST CYST EXCISION Left 2002   FRACTURE SURGERY     KNEE SURGERY     right   LAPAROSCOPIC SUPRACERVICAL HYSTERECTOMY  08/05/2013   ORIF WRIST FRACTURE Left 01/17/2017   Procedure: OPEN REDUCTION INTERNAL FIXATION (ORIF) WRIST FRACTURE;  Surgeon: Lovell Sheehan,  MD;  Location: [facility];  Service: Orthopedics;  Laterality: Left;   RADIOLOGY WITH ANESTHESIA N/A 03/18/2020   Procedure: MRI WITH ANESTHESIA CERVICAL SPINE AND BRAIN  WITH AND WITHOUT CONTRAST;  Surgeon: Radiologist, Medication, MD;  Location: [facility];  Service: Radiology;  Laterality: N/A;   TUBAL LIGATION Bilateral    VAGINAL HYSTERECTOMY  03/2006    There were no vitals filed for this visit.   Subjective Assessment - 11/04/20 1221     Subjective Patient reports feeling good today, ready to walk.    Pertinent History Patient is previous patient of this therapist, was admitted to the hospital on 08/03/19 for MS exacerbation and then went to inpatient rehab on 08/08/19. Patient then received home health therapy and now is returning to outpatient PT. PMh includes HTN, SVT, neurogenic bowel, MS (1995), neuropathy, hx of wrist fx, HPV, Hypercholesteremia, adiposity, osteopenia, spasticity. She wears bilateral AFOs and uses a RW and/or wheelchair for mobility. She drives an adapted car.    Limitations Lifting;Standing;Walking;House hold activities    How long can you sit comfortably? n/a    How long can you stand comfortably? 4 minutes    How long can you walk comfortably? walked 50 ft in inpatient rehab    Patient Stated Goals return to PLOF, increase walking, get into shower,  increase mobility    Currently in Pain? No/denies                   TREATMENT: Patient ambulates >171f, 2 seated rest breaks CGA and wheelchair follow for safety. Patient required min VCs for correct technique including to increase core stabilization to facilitate better hip flexion to advance limb forward. Patient demonstrates inc reliance on BUE by end of gait trial. She started exhibiting increased circumduction with LLE towards end of distance with increased fatigue reported.      Seated:  Seated boxing: cues for sequencing, body mechanics, and pace/rhythm for functional contraction and timing of muscle  recruitment: -Cross body punches to mitts on PT hands with no back support for focused core stabilization with crossing midline 2x 60 seconds  -Cross body punch with secondary combination of elbow for full cross body rotation to PT mitt for trunk stability, coordination, and cardiovascular challenge x 60 seconds -Upper cut to mitts in PT hands for core activation without back support with perturbations 60 seconds Hamstring lengthening 60 seconds each LE   Pt educated throughout session about proper posture and technique with exercises. Improved exercise technique, movement at target joints, use of target muscles after min to mod verbal, visual, tactile cues  Patient is highly motivated throughout physical therapy session. She is able to tolerate progressive ambulation with decreased episodes of foot drag indicating improved LE recruitment patterning. The patient would benefit from continued skilled PT intervention to maximize QOL, safety, and function                  PT Education - 11/04/20 1222     Education provided Yes    Education Details exercise technique, body mechanics    Person(s) Educated Patient    Methods Explanation;Demonstration;Verbal cues;Tactile cues    Comprehension Verbalized understanding;Returned demonstration;Verbal cues required;Tactile cues required              PT Short Term Goals - 08/26/20 1115       PT SHORT TERM GOAL #1   Title Patient will be independent in home exercise program to improve strength/mobility for better functional independence with ADLs.    Baseline 5/19: HEP next session 6/22: HEP compliant 8/10 : HEP progressions compliant 8/31 HEP compliant 10/5: HEP compliant    Time 4    Period Weeks    Status Achieved    Target Date 01/13/20      PT SHORT TERM GOAL #2   Title Patient will perform a sit to stand with SUE support for increased ind. with transfers.    Baseline 5/19: heavy BUE support 6/22: requires one hand on walker  one hand on w/c 8/10: able to perform intermittently (some days can some days cannot) 8/31: attempt next session 10/5: BUE reliance, patient unable to complete 4 full stands. Attempt next session 1/25: able to perform from elevated surface only; 3/22 pt pushes through L arm off of wheelchair with R arm on RW for tactile cue/balance to perform transfer 4/21: requires use of UE's    Time 4    Period Weeks    Status On-going    Target Date 09/23/20               PT Long Term Goals - 10/12/20 0001       PT LONG TERM GOAL #1   Title Patient will increase FOTO score to equal to or greater than  53/100   to demonstrate statistically significant improvement in  mobility and quality of life.    Baseline 8/31: 48.13% 7/27:  48.1% 6/22: 46.99% 5/18: 48/100 10/5: 49/100 11/2: 48%  12/30: 46.9% 1/25: 46.99%; 07/27/2020 FOTO 42% 4/21: 47% 6/7: 44%    Time 12    Period Weeks    Status Partially Met    Target Date 11/18/20      PT LONG TERM GOAL #2   Title Patient (< 49 years old) will complete five times sit to stand test in < 20 seconds indicating an increased LE strength and improved balance.    Baseline 7/27: 26.8 seconds with one partial stand 6/22: 40. 86 sec; unable to reach full stand last two attempts 5/19: 42 seconds 10/5: BUE reliance, patient unable to complete 4 full stands. Attempt next session 11/2: 23.71 seconds 12/30: 30.32 with one near LOB due to spasms 1/25: 27.11 seconds with one hand on RW one hand on chair; 37.5 seconds with use of BUEs 4/21: 28.2 seconds 6/7: 27.67 with BUE    Time 12    Period Weeks    Status Partially Met    Target Date 11/18/20      PT LONG TERM GOAL #3   Title Patient will increase 10 meter walk test to <20 seconds as to improve gait speed for better community ambulation and to reduce fall risk.    Baseline 8/31: 35.1 seconds with RW 7/27: 36 seconds with RW 6/22: 36.1 seconds 5/19: 42 seconds 10/5: 31.9 seconds with RW 11/2: 29.9 seconds with RW 12/30:  deferred due to spasm 1/25: defer to next session 1/27: 29 seconds with RW; 07/27/2020 deferred; 07/29/2020 41.5 sec (0.24 m/s) 4/21: 32.3 seconds with RW 6/7: 38.71    Time 12    Period Weeks    Status Partially Met    Target Date 11/18/20      PT LONG TERM GOAL #4   Title Patient will reduce timed up and go to <11 seconds to reduce fall risk and demonstrate improved transfer/gait ability.    Baseline 8/31: 50.8 seconds with RW 7/27: 43.65 6/22: 45.4 seconds 5/19: 47 seconds 10/5: 38.5 seconds with RW 11/2: 35.72 seconds with RW 12/30: 43.5 seconds with wheels wet. 1/25: deferred 1/27: 38.9 seconds; 07/27/2020 82 seconds with RW, CGA; 07/29/2020 44 seconds 4/21: 38 seconds with RW 6/7: 42.31 with RW    Time 12    Period Weeks    Status Partially Met    Target Date 11/18/20      PT LONG TERM GOAL #5   Title Patient will ambulate from physical therapy gym to elevator down the hall, ride elevator to first floor, and walk to valet with RW and chair follow for increased capacity for mobility and ind.    Baseline 8/31: able to open door ~50 ft with one rest break 8/10: unable to ambulate to elevator without rest break 9/30: able to ambulate to elevator with 3 rest breaks 1/25: 181 ft with 2 seated rest break (to elevator); 07/27/2020 deferred 4/21 able to perform with one seated rest break to elevator unable to get into elevator yet 6/7: able to perform with three rest breaks    Time 12    Period Weeks    Status Partially Met    Target Date 11/18/20      PT LONG TERM GOAL #6   Title Patient will be able to stand with RW and lift one leg onto 4" step for carryover to negotiation of steps at home.    Baseline  11/2: unable to perform 1/25: R foot 0.75 inch lift; 07/27/2020 heel not fully off ground, but pt slightly able to lift R LE to tap side of step while holding onto RW with both hands 4/21: able to clear foot ~3/4 inch with RLE 6/7 .75 with RLE    Time 12    Period Weeks    Status Partially Met     Target Date 11/18/20                   Plan - 11/04/20 1249     Clinical Impression Statement Patient is highly motivated throughout physical therapy session. She is able to tolerate progressive ambulation with decreased episodes of foot drag indicating improved LE recruitment patterning. The patient would benefit from continued skilled PT intervention to maximize QOL, safety, and function    Personal Factors and Comorbidities Age;Comorbidity 3+;Fitness;Past/Current Experience;Sex;Time since onset of injury/illness/exacerbation    Comorbidities HTN, SVT, neurogenic bowel, MS (1995), neuropathy, hx of wrist fx, HPV, Hypercholesteremia, adiposity, osteopenia, spasticity.    Examination-Activity Limitations Bathing;Bed Mobility;Bend;Carry;Continence;Dressing;Hygiene/Grooming;Stairs;Squat;Reach Overhead;Locomotion Level;Lift;Stand;Transfers    Examination-Participation Restrictions Church;Cleaning;Community Activity;Laundry;Volunteer;Shop;Meal Prep;Yard Work    [employer] Evolving/Moderate complexity    Rehab Potential Good    PT Frequency 2x / week    PT Duration 12 weeks    PT Treatment/Interventions ADLs/Self Care Home Management;Aquatic Therapy;Biofeedback;Cryotherapy;Electrical Stimulation;Iontophoresis 40m/ml Dexamethasone;Traction;Moist Heat;Ultrasound;DME Instruction;Gait training;Stair training;Functional mobility training;Therapeutic activities;Therapeutic exercise;Balance training;Neuromuscular re-education;Patient/family education;Orthotic Fit/Training;Wheelchair mobility training;Manual techniques;Compression bandaging;Passive range of motion;Dry needling;Energy conservation;Taping;Splinting;Vestibular    PT Next Visit Plan step negotiation, turning directions to the L and R; re-test goals    PT Home Exercise Plan maintain current HEP from [facility] Agree with Plan of Care Patient             Patient will benefit from skilled therapeutic  intervention in order to improve the following deficits and impairments:  Abnormal gait, Decreased activity tolerance, Decreased balance, Decreased coordination, Decreased endurance, Decreased mobility, Decreased range of motion, Decreased strength, Difficulty walking, Impaired perceived functional ability, Impaired sensation, Impaired tone, Impaired UE functional use, Improper body mechanics, Postural dysfunction, Cardiopulmonary status limiting activity, Impaired flexibility, Increased muscle spasms  Visit Diagnosis: Muscle weakness (generalized)  Unsteadiness on feet  Other abnormalities of gait and mobility  Multiple sclerosis exacerbation (HCC)     Problem List Patient Active Problem List   Diagnosis Date Noted   Hypoalbuminemia due to protein-calorie malnutrition (HCC)    Neurogenic bowel    Neurogenic bladder    Labile blood pressure    Neuropathic pain    Abscess of female pelvis    SVT (supraventricular tachycardia) (HCC)    Radial styloid tenosynovitis 03/12/2018   Wheelchair confinement 02/27/2018   Localized osteoporosis with current pathological fracture with routine healing 01/19/2017   Wrist fracture 01/16/2017   Sprain of ankle 03/23/2016   Closed fracture of lateral malleolus 03/16/2016   Health care maintenance 01/24/2016   Blood pressure elevated without history of HTN 10/25/2015   Essential hypertension 10/25/2015   Multiple sclerosis ([place] 10/02/2015   Chronic left shoulder pain 07/19/2015   [place] 07/14/2015   MS (multiple sclerosis) ([place] 11/26/2014   Increased body mass index 11/26/2014   HPV test positive 11/26/2014   Status post laparoscopic supracervical hysterectomy 11/26/2014   Galactorrhea 11/26/2014   Back ache 05/21/2014   Adiposity 05/21/2014   Disordered sleep 05/21/2014   Muscle spasticity 05/21/2014   Spasticity 05/21/2014   Calculus of kidney 12/09/2013  Renal colic 48/14/4392   Hypercholesteremia  08/19/2013   Hereditary and idiopathic neuropathy 08/19/2013   Hypercholesterolemia without hypertriglyceridemia 08/19/2013   Bladder infection, chronic 07/25/2012   Disorder of bladder function 07/25/2012   Incomplete bladder emptying 07/25/2012   Microscopic hematuria 07/25/2012   Right upper quadrant pain 07/25/2012    Janna Arch, PT, DPT  11/04/2020, 12:51 PM  [place] MAIN [facility] SERVICES [address] [place], [place], [address] Phone: [phone]   Fax:  [phone]  Name: Lisa Williams MRN: [id number] Date of Birth: 1960-09-24

## 2020-11-09 ENCOUNTER — Ambulatory Visit: Payer: PPO | Attending: Neurology

## 2020-11-09 ENCOUNTER — Other Ambulatory Visit: Payer: Self-pay

## 2020-11-09 DIAGNOSIS — R2689 Other abnormalities of gait and mobility: Secondary | ICD-10-CM | POA: Insufficient documentation

## 2020-11-09 DIAGNOSIS — G35 Multiple sclerosis: Secondary | ICD-10-CM | POA: Insufficient documentation

## 2020-11-09 DIAGNOSIS — M6281 Muscle weakness (generalized): Secondary | ICD-10-CM | POA: Insufficient documentation

## 2020-11-09 DIAGNOSIS — R2681 Unsteadiness on feet: Secondary | ICD-10-CM | POA: Insufficient documentation

## 2020-11-09 NOTE — Therapy (Signed)
[facility] SERVICES [address] [place], [place], [address] Phone: [phone]   Fax:  [phone]  Physical Therapy Treatment  Patient Details  Name: Lisa Williams MRN: [id number] Date of Birth: 11-08-1960 No data recorded  Encounter Date: 11/09/2020   PT End of Session - 11/09/20 1129     Visit Number 108    Number of Visits 112    Date for PT Re-Evaluation 11/18/20    Authorization Type 8/10 PN 10/12/20    PT Start Time 1015    PT Stop Time 1101    PT Time Calculation (min) 46 min    Equipment Utilized During Treatment Gait belt;Other (comment)    Activity Tolerance Patient tolerated treatment well;Patient limited by fatigue    Behavior During Therapy [facility] for tasks assessed/performed             Past Medical History:  Diagnosis Date   Abdominal pain, right upper quadrant    Back pain    Calculus of kidney 12/09/2013   Chronic back pain    unspecified   Chronic left shoulder pain 3/81/8299   Complication of anesthesia    Functional disorder of bladder    other   Galactorrhea 11/26/2014   Chronic    Hereditary and idiopathic neuropathy 08/19/2013   HPV test positive    Hypercholesteremia 08/19/2013   Hypertension    Incomplete bladder emptying    Microscopic hematuria    MS (multiple sclerosis) (HCC)    Muscle spasticity 05/21/2014   Nonspecific findings on examination of urine    other   Osteopenia    PONV (postoperative nausea and vomiting)    Status post laparoscopic supracervical hysterectomy 11/26/2014   Tobacco user 11/26/2014   Wrist fracture     Past Surgical History:  Procedure Laterality Date   bilateral tubal ligation  1996   BREAST CYST EXCISION Left 2002   FRACTURE SURGERY     KNEE SURGERY     right   LAPAROSCOPIC SUPRACERVICAL HYSTERECTOMY  08/05/2013   ORIF WRIST FRACTURE Left 01/17/2017   Procedure: OPEN REDUCTION INTERNAL FIXATION (ORIF) WRIST FRACTURE;  Surgeon: Lovell Sheehan,  MD;  Location: [facility];  Service: Orthopedics;  Laterality: Left;   RADIOLOGY WITH ANESTHESIA N/A 03/18/2020   Procedure: MRI WITH ANESTHESIA CERVICAL SPINE AND BRAIN  WITH AND WITHOUT CONTRAST;  Surgeon: Radiologist, Medication, MD;  Location: [place];  Service: Radiology;  Laterality: N/A;   TUBAL LIGATION Bilateral    VAGINAL HYSTERECTOMY  03/2006    There were no vitals filed for this visit.   Subjective Assessment - 11/09/20 1128     Subjective Patient reports feeling well. Was able to go shopping over the weekend. Is heading to [place] this weekend.    Pertinent History Patient is previous patient of this therapist, was admitted to the hospital on 08/03/19 for MS exacerbation and then went to inpatient rehab on 08/08/19. Patient then received home health therapy and now is returning to outpatient PT. PMh includes HTN, SVT, neurogenic bowel, MS (1995), neuropathy, hx of wrist fx, HPV, Hypercholesteremia, adiposity, osteopenia, spasticity. She wears bilateral AFOs and uses a RW and/or wheelchair for mobility. She drives an adapted car.    Limitations Lifting;Standing;Walking;House hold activities    How long can you sit comfortably? n/a    How long can you stand comfortably? 4 minutes    How long can you walk comfortably? walked 50 ft in inpatient rehab    Patient  Stated Goals return to PLOF, increase walking, get into shower, increase mobility    Currently in Pain? No/denies              Standing in // bars: Weight shift 10x each LE Hip flexion into GTB across bands; 10x each LE;   Attempt at standing for more standing interventions unsuccessful due to fatigue; performed rest of session in seated.   Seated.  5lb bar: -chest press 10x -straight bar row 10x - clockwise row 10x, counterclockwise row 10x -witches brew circles 10x each direction -bicep curl 10x; cues for upright posture -tricep extension with bar vertical 10x each UE; very challenging for patient -opposite arm  flexion with other arm keeping bar on knees.   Pectoral stretch with trunk extension 10x Forward/backwards trunk lean for core stabilization 10x Lateral L/R trunk lean for core stabilization 10x each side Modified windmill 10x each side; very fatiguing for patient Adduction squeeze into PT hand 10x 3 second holds RTB abduction 10x bilateral LE     Ice pack utilized for symptom exacerbation reduction.      Pt educated throughout session about proper posture and technique with exercises. Improved exercise technique, movement at target joints, use of target muscles after min to mod verbal, visual, tactile cues   Patient is highly motivated throughout physical therapy session and is able to tolerate progressive exercises in seated despite fatigue from standing. Core stabilization and UE strengthening tolerated well. The patient would benefit from continued skilled PT intervention to maximize QOL, safety, and function                    PT Education - 11/09/20 1129     Education provided Yes    Education Details exercise technique, body mechanics    Person(s) Educated Patient    Methods Explanation;Demonstration;Tactile cues;Verbal cues    Comprehension Verbalized understanding;Returned demonstration;Verbal cues required;Tactile cues required              PT Short Term Goals - 08/26/20 1115       PT SHORT TERM GOAL #1   Title Patient will be independent in home exercise program to improve strength/mobility for better functional independence with ADLs.    Baseline 5/19: HEP next session 6/22: HEP compliant 8/10 : HEP progressions compliant 8/31 HEP compliant 10/5: HEP compliant    Time 4    Period Weeks    Status Achieved    Target Date 01/13/20      PT SHORT TERM GOAL #2   Title Patient will perform a sit to stand with SUE support for increased ind. with transfers.    Baseline 5/19: heavy BUE support 6/22: requires one hand on walker one hand on w/c 8/10: able  to perform intermittently (some days can some days cannot) 8/31: attempt next session 10/5: BUE reliance, patient unable to complete 4 full stands. Attempt next session 1/25: able to perform from elevated surface only; 3/22 pt pushes through L arm off of wheelchair with R arm on RW for tactile cue/balance to perform transfer 4/21: requires use of UE's    Time 4    Period Weeks    Status On-going    Target Date 09/23/20               PT Long Term Goals - 10/12/20 0001       PT LONG TERM GOAL #1   Title Patient will increase FOTO score to equal to or greater than  53/100  to demonstrate statistically significant improvement in mobility and quality of life.    Baseline 8/31: 48.13% 7/27:  48.1% 6/22: 46.99% 5/18: 48/100 10/5: 49/100 11/2: 48%  12/30: 46.9% 1/25: 46.99%; 07/27/2020 FOTO 42% 4/21: 47% 6/7: 44%    Time 12    Period Weeks    Status Partially Met    Target Date 11/18/20      PT LONG TERM GOAL #2   Title Patient (< 64 years old) will complete five times sit to stand test in < 20 seconds indicating an increased LE strength and improved balance.    Baseline 7/27: 26.8 seconds with one partial stand 6/22: 40. 86 sec; unable to reach full stand last two attempts 5/19: 42 seconds 10/5: BUE reliance, patient unable to complete 4 full stands. Attempt next session 11/2: 23.71 seconds 12/30: 30.32 with one near LOB due to spasms 1/25: 27.11 seconds with one hand on RW one hand on chair; 37.5 seconds with use of BUEs 4/21: 28.2 seconds 6/7: 27.67 with BUE    Time 12    Period Weeks    Status Partially Met    Target Date 11/18/20      PT LONG TERM GOAL #3   Title Patient will increase 10 meter walk test to <20 seconds as to improve gait speed for better community ambulation and to reduce fall risk.    Baseline 8/31: 35.1 seconds with RW 7/27: 36 seconds with RW 6/22: 36.1 seconds 5/19: 42 seconds 10/5: 31.9 seconds with RW 11/2: 29.9 seconds with RW 12/30: deferred due to spasm 1/25:  defer to next session 1/27: 29 seconds with RW; 07/27/2020 deferred; 07/29/2020 41.5 sec (0.24 m/s) 4/21: 32.3 seconds with RW 6/7: 38.71    Time 12    Period Weeks    Status Partially Met    Target Date 11/18/20      PT LONG TERM GOAL #4   Title Patient will reduce timed up and go to <11 seconds to reduce fall risk and demonstrate improved transfer/gait ability.    Baseline 8/31: 50.8 seconds with RW 7/27: 43.65 6/22: 45.4 seconds 5/19: 47 seconds 10/5: 38.5 seconds with RW 11/2: 35.72 seconds with RW 12/30: 43.5 seconds with wheels wet. 1/25: deferred 1/27: 38.9 seconds; 07/27/2020 82 seconds with RW, CGA; 07/29/2020 44 seconds 4/21: 38 seconds with RW 6/7: 42.31 with RW    Time 12    Period Weeks    Status Partially Met    Target Date 11/18/20      PT LONG TERM GOAL #5   Title Patient will ambulate from physical therapy gym to elevator down the hall, ride elevator to first floor, and walk to valet with RW and chair follow for increased capacity for mobility and ind.    Baseline 8/31: able to open door ~50 ft with one rest break 8/10: unable to ambulate to elevator without rest break 9/30: able to ambulate to elevator with 3 rest breaks 1/25: 181 ft with 2 seated rest break (to elevator); 07/27/2020 deferred 4/21 able to perform with one seated rest break to elevator unable to get into elevator yet 6/7: able to perform with three rest breaks    Time 12    Period Weeks    Status Partially Met    Target Date 11/18/20      PT LONG TERM GOAL #6   Title Patient will be able to stand with RW and lift one leg onto 4" step for carryover to negotiation of steps  at home.    Baseline 11/2: unable to perform 1/25: R foot 0.75 inch lift; 07/27/2020 heel not fully off ground, but pt slightly able to lift R LE to tap side of step while holding onto RW with both hands 4/21: able to clear foot ~3/4 inch with RLE 6/7 .75 with RLE    Time 12    Period Weeks    Status Partially Met    Target Date 11/18/20                    Plan - 11/09/20 1152     Clinical Impression Statement Patient is highly motivated throughout physical therapy session and is able to tolerate progressive exercises in seated despite fatigue from standing. Core stabilization and UE strengthening tolerated well. The patient would benefit from continued skilled PT intervention to maximize QOL, safety, and function    Personal Factors and Comorbidities Age;Comorbidity 3+;Fitness;Past/Current Experience;Sex;Time since onset of injury/illness/exacerbation    Comorbidities HTN, SVT, neurogenic bowel, MS (1995), neuropathy, hx of wrist fx, HPV, Hypercholesteremia, adiposity, osteopenia, spasticity.    Examination-Activity Limitations Bathing;Bed Mobility;Bend;Carry;Continence;Dressing;Hygiene/Grooming;Stairs;Squat;Reach Overhead;Locomotion Level;Lift;Stand;Transfers    Examination-Participation Restrictions Church;Cleaning;Community Activity;Laundry;Volunteer;Shop;Meal Prep;Yard Work    [employer] Evolving/Moderate complexity    Rehab Potential Good    PT Frequency 2x / week    PT Duration 12 weeks    PT Treatment/Interventions ADLs/Self Care Home Management;Aquatic Therapy;Biofeedback;Cryotherapy;Electrical Stimulation;Iontophoresis 73m/ml Dexamethasone;Traction;Moist Heat;Ultrasound;DME Instruction;Gait training;Stair training;Functional mobility training;Therapeutic activities;Therapeutic exercise;Balance training;Neuromuscular re-education;Patient/family education;Orthotic Fit/Training;Wheelchair mobility training;Manual techniques;Compression bandaging;Passive range of motion;Dry needling;Energy conservation;Taping;Splinting;Vestibular    PT Next Visit Plan step negotiation, turning directions to the L and R; re-test goals    PT Home Exercise Plan maintain current HEP from [facility] Agree with Plan of Care Patient             Patient will benefit from skilled therapeutic intervention in  order to improve the following deficits and impairments:  Abnormal gait, Decreased activity tolerance, Decreased balance, Decreased coordination, Decreased endurance, Decreased mobility, Decreased range of motion, Decreased strength, Difficulty walking, Impaired perceived functional ability, Impaired sensation, Impaired tone, Impaired UE functional use, Improper body mechanics, Postural dysfunction, Cardiopulmonary status limiting activity, Impaired flexibility, Increased muscle spasms  Visit Diagnosis: Muscle weakness (generalized)  Unsteadiness on feet  Other abnormalities of gait and mobility     Problem List Patient Active Problem List   Diagnosis Date Noted   Hypoalbuminemia due to protein-calorie malnutrition (HCC)    Neurogenic bowel    Neurogenic bladder    Labile blood pressure    Neuropathic pain    Abscess of female pelvis    SVT (supraventricular tachycardia) (HCC)    Radial styloid tenosynovitis 03/12/2018   Wheelchair confinement 02/27/2018   Localized osteoporosis with current pathological fracture with routine healing 01/19/2017   Wrist fracture 01/16/2017   Sprain of ankle 03/23/2016   Closed fracture of lateral malleolus 03/16/2016   Health care maintenance 01/24/2016   Blood pressure elevated without history of HTN 10/25/2015   Essential hypertension 10/25/2015   Multiple sclerosis ([place] 10/02/2015   Chronic left shoulder pain 07/19/2015   [place] 07/14/2015   [place] 11/26/2014   Increased body mass index 11/26/2014   HPV test positive 11/26/2014   Status post laparoscopic supracervical hysterectomy 11/26/2014   Galactorrhea 11/26/2014   Back ache 05/21/2014   Adiposity 05/21/2014   Disordered sleep 05/21/2014   Muscle spasticity 05/21/2014   Spasticity 05/21/2014   Calculus  of kidney 90/68/9340   Renal colic 68/40/3353   Hypercholesteremia 08/19/2013   Hereditary and idiopathic neuropathy  08/19/2013   Hypercholesterolemia without hypertriglyceridemia 08/19/2013   Bladder infection, chronic 07/25/2012   Disorder of bladder function 07/25/2012   Incomplete bladder emptying 07/25/2012   Microscopic hematuria 07/25/2012   Right upper quadrant pain 07/25/2012    Janna Arch, PT, DPT  11/09/2020, 11:54 AM  [place] [facility] SERVICES [address] [place], [place], [address] Phone: [phone]   Fax:  [phone]  Name: Taraji Mungo Kotlyar MRN: [id number] Date of Birth: 09-Sep-1960

## 2020-11-11 ENCOUNTER — Other Ambulatory Visit: Payer: Self-pay

## 2020-11-11 ENCOUNTER — Ambulatory Visit: Payer: PPO

## 2020-11-11 DIAGNOSIS — R2681 Unsteadiness on feet: Secondary | ICD-10-CM

## 2020-11-11 DIAGNOSIS — M6281 Muscle weakness (generalized): Secondary | ICD-10-CM

## 2020-11-11 DIAGNOSIS — G35 Multiple sclerosis: Secondary | ICD-10-CM

## 2020-11-11 DIAGNOSIS — R2689 Other abnormalities of gait and mobility: Secondary | ICD-10-CM

## 2020-11-11 NOTE — Therapy (Signed)
[place] MAIN [facility] SERVICES [address] [place], [place], [address] Phone: [phone]   Fax:  [phone]  Physical Therapy Treatment  Patient Details  Name: Lisa Williams MRN: [id number] Date of Birth: 02-15-1961 No data recorded  Encounter Date: 11/11/2020   PT End of Session - 11/11/20 1249     Visit Number 109    Number of Visits 112    Date for PT Re-Evaluation 11/18/20    Authorization Type 9/10 PN 10/12/20    PT Start Time 1015    PT Stop Time 1059    PT Time Calculation (min) 44 min    Equipment Utilized During Treatment Gait belt;Other (comment)    Activity Tolerance Patient tolerated treatment well;Patient limited by fatigue    Behavior During Therapy [facility] for tasks assessed/performed             Past Medical History:  Diagnosis Date   Abdominal pain, right upper quadrant    Back pain    Calculus of kidney 12/09/2013   Chronic back pain    unspecified   Chronic left shoulder pain 11/06/7791   Complication of anesthesia    Functional disorder of bladder    other   Galactorrhea 11/26/2014   Chronic    Hereditary and idiopathic neuropathy 08/19/2013   HPV test positive    Hypercholesteremia 08/19/2013   Hypertension    Incomplete bladder emptying    Microscopic hematuria    MS (multiple sclerosis) (HCC)    Muscle spasticity 05/21/2014   Nonspecific findings on examination of urine    other   Osteopenia    PONV (postoperative nausea and vomiting)    Status post laparoscopic supracervical hysterectomy 11/26/2014   Tobacco user 11/26/2014   Wrist fracture     Past Surgical History:  Procedure Laterality Date   bilateral tubal ligation  1996   BREAST CYST EXCISION Left 2002   FRACTURE SURGERY     KNEE SURGERY     right   LAPAROSCOPIC SUPRACERVICAL HYSTERECTOMY  08/05/2013   ORIF WRIST FRACTURE Left 01/17/2017   Procedure: OPEN REDUCTION INTERNAL FIXATION (ORIF) WRIST FRACTURE;  Surgeon: Lovell Sheehan,  MD;  Location: [facility];  Service: Orthopedics;  Laterality: Left;   RADIOLOGY WITH ANESTHESIA N/A 03/18/2020   Procedure: MRI WITH ANESTHESIA CERVICAL SPINE AND BRAIN  WITH AND WITHOUT CONTRAST;  Surgeon: Radiologist, Medication, MD;  Location: [place];  Service: Radiology;  Laterality: N/A;   TUBAL LIGATION Bilateral    VAGINAL HYSTERECTOMY  03/2006    There were no vitals filed for this visit.   Subjective Assessment - 11/11/20 1248     Subjective Patient is heading to [place] for a family trip on thursday. No falls or LOB since last session. Has some low back pain this session.    Pertinent History Patient is previous patient of this therapist, was admitted to the hospital on 08/03/19 for MS exacerbation and then went to inpatient rehab on 08/08/19. Patient then received home health therapy and now is returning to outpatient PT. PMh includes HTN, SVT, neurogenic bowel, MS (1995), neuropathy, hx of wrist fx, HPV, Hypercholesteremia, adiposity, osteopenia, spasticity. She wears bilateral AFOs and uses a RW and/or wheelchair for mobility. She drives an adapted car.    Limitations Lifting;Standing;Walking;House hold activities    How long can you sit comfortably? n/a    How long can you stand comfortably? 4 minutes    How long can you walk comfortably? walked 50 ft  in inpatient rehab    Patient Stated Goals return to PLOF, increase walking, get into shower, increase mobility    Currently in Pain? Yes    Pain Score 4     Pain Location Back    Pain Orientation Lower;Right    Pain Descriptors / Indicators Aching    Pain Type Acute pain    Pain Onset Today               Patient ambulates >55f, 1seated rest break CGA and wheelchair follow for safety. Patient required min VCs for correct technique including to increase core stabilization to facilitate better hip flexion to advance limb forward. Patient demonstrates inc reliance on BUE by end of gait trial. She started exhibiting increased  circumduction with LLE towards end of distance with increased fatigue reported.     Seated.  STM with implementation of effleurage and petrissage to lower R paraspinals, quadratus, and flank region x 8 minutes  Seated:  Seated boxing: cues for sequencing, body mechanics, and pace/rhythm for functional contraction and timing of muscle recruitment: -Cross body punches to mitts on PT hands with no back support for focused core stabilization with crossing midline 2x 60 seconds  -Cross body punch with secondary combination of elbow for full cross body rotation to PT mitt for trunk stability, coordination, and cardiovascular challenge x 60 seconds -Upper cut to mitts in PT hands for core activation without back support with perturbations 60 seconds  Dynadisc df isometric pressing through heel 10x each LE, dynadisc pf isometric pressing through toe 10x each LE  Ice pack utilized for symptom exacerbation reduction.      Pt educated throughout session about proper posture and technique with exercises. Improved exercise technique, movement at target joints, use of target muscles after min to mod verbal, visual, tactile cues   Patient is able to tolerate ambulation after STM to low back to reduce spasms. She is highly motivated for progression of interventions despite pain. She does require cueing for rest breaks to prevent exacerbations at this time. Will be out of town. The patient would benefit from continued skilled PT intervention to maximize QOL, safety, and function                    PT Education - 11/11/20 1249     Education provided Yes    Education Details exercise technique, body mechanics    Person(s) Educated Patient    Methods Explanation;Demonstration;Tactile cues;Verbal cues    Comprehension Verbalized understanding;Returned demonstration;Verbal cues required;Tactile cues required              PT Short Term Goals - 08/26/20 1115       PT SHORT TERM GOAL  #1   Title Patient will be independent in home exercise program to improve strength/mobility for better functional independence with ADLs.    Baseline 5/19: HEP next session 6/22: HEP compliant 8/10 : HEP progressions compliant 8/31 HEP compliant 10/5: HEP compliant    Time 4    Period Weeks    Status Achieved    Target Date 01/13/20      PT SHORT TERM GOAL #2   Title Patient will perform a sit to stand with SUE support for increased ind. with transfers.    Baseline 5/19: heavy BUE support 6/22: requires one hand on walker one hand on w/c 8/10: able to perform intermittently (some days can some days cannot) 8/31: attempt next session 10/5: BUE reliance, patient unable to complete 4 full  stands. Attempt next session 1/25: able to perform from elevated surface only; 3/22 pt pushes through L arm off of wheelchair with R arm on RW for tactile cue/balance to perform transfer 4/21: requires use of UE's    Time 4    Period Weeks    Status On-going    Target Date 09/23/20               PT Long Term Goals - 10/12/20 0001       PT LONG TERM GOAL #1   Title Patient will increase FOTO score to equal to or greater than  53/100   to demonstrate statistically significant improvement in mobility and quality of life.    Baseline 8/31: 48.13% 7/27:  48.1% 6/22: 46.99% 5/18: 48/100 10/5: 49/100 11/2: 48%  12/30: 46.9% 1/25: 46.99%; 07/27/2020 FOTO 42% 4/21: 47% 6/7: 44%    Time 12    Period Weeks    Status Partially Met    Target Date 11/18/20      PT LONG TERM GOAL #2   Title Patient (< 21 years old) will complete five times sit to stand test in < 20 seconds indicating an increased LE strength and improved balance.    Baseline 7/27: 26.8 seconds with one partial stand 6/22: 40. 86 sec; unable to reach full stand last two attempts 5/19: 42 seconds 10/5: BUE reliance, patient unable to complete 4 full stands. Attempt next session 11/2: 23.71 seconds 12/30: 30.32 with one near LOB due to spasms 1/25:  27.11 seconds with one hand on RW one hand on chair; 37.5 seconds with use of BUEs 4/21: 28.2 seconds 6/7: 27.67 with BUE    Time 12    Period Weeks    Status Partially Met    Target Date 11/18/20      PT LONG TERM GOAL #3   Title Patient will increase 10 meter walk test to <20 seconds as to improve gait speed for better community ambulation and to reduce fall risk.    Baseline 8/31: 35.1 seconds with RW 7/27: 36 seconds with RW 6/22: 36.1 seconds 5/19: 42 seconds 10/5: 31.9 seconds with RW 11/2: 29.9 seconds with RW 12/30: deferred due to spasm 1/25: defer to next session 1/27: 29 seconds with RW; 07/27/2020 deferred; 07/29/2020 41.5 sec (0.24 m/s) 4/21: 32.3 seconds with RW 6/7: 38.71    Time 12    Period Weeks    Status Partially Met    Target Date 11/18/20      PT LONG TERM GOAL #4   Title Patient will reduce timed up and go to <11 seconds to reduce fall risk and demonstrate improved transfer/gait ability.    Baseline 8/31: 50.8 seconds with RW 7/27: 43.65 6/22: 45.4 seconds 5/19: 47 seconds 10/5: 38.5 seconds with RW 11/2: 35.72 seconds with RW 12/30: 43.5 seconds with wheels wet. 1/25: deferred 1/27: 38.9 seconds; 07/27/2020 82 seconds with RW, CGA; 07/29/2020 44 seconds 4/21: 38 seconds with RW 6/7: 42.31 with RW    Time 12    Period Weeks    Status Partially Met    Target Date 11/18/20      PT LONG TERM GOAL #5   Title Patient will ambulate from physical therapy gym to elevator down the hall, ride elevator to first floor, and walk to valet with RW and chair follow for increased capacity for mobility and ind.    Baseline 8/31: able to open door ~50 ft with one rest break 8/10: unable to ambulate  to elevator without rest break 9/30: able to ambulate to elevator with 3 rest breaks 1/25: 181 ft with 2 seated rest break (to elevator); 07/27/2020 deferred 4/21 able to perform with one seated rest break to elevator unable to get into elevator yet 6/7: able to perform with three rest breaks     Time 12    Period Weeks    Status Partially Met    Target Date 11/18/20      PT LONG TERM GOAL #6   Title Patient will be able to stand with RW and lift one leg onto 4" step for carryover to negotiation of steps at home.    Baseline 11/2: unable to perform 1/25: R foot 0.75 inch lift; 07/27/2020 heel not fully off ground, but pt slightly able to lift R LE to tap side of step while holding onto RW with both hands 4/21: able to clear foot ~3/4 inch with RLE 6/7 .75 with RLE    Time 12    Period Weeks    Status Partially Met    Target Date 11/18/20                   Plan - 11/11/20 1355     Clinical Impression Statement Patient is able to tolerate ambulation after STM to low back to reduce spasms. She is highly motivated for progression of interventions despite pain. She does require cueing for rest breaks to prevent exacerbations at this time. Will be out of town. The patient would benefit from continued skilled PT intervention to maximize QOL, safety, and function    Personal Factors and Comorbidities Age;Comorbidity 3+;Fitness;Past/Current Experience;Sex;Time since onset of injury/illness/exacerbation    Comorbidities HTN, SVT, neurogenic bowel, MS (1995), neuropathy, hx of wrist fx, HPV, Hypercholesteremia, adiposity, osteopenia, spasticity.    Examination-Activity Limitations Bathing;Bed Mobility;Bend;Carry;Continence;Dressing;Hygiene/Grooming;Stairs;Squat;Reach Overhead;Locomotion Level;Lift;Stand;Transfers    Examination-Participation Restrictions Church;Cleaning;Community Activity;Laundry;Volunteer;Shop;Meal Prep;Yard Work    [employer]chant [employer] Evolving/Moderate complexity    Rehab Potential Good    PT Frequency 2x / week    PT Duration 12 weeks    PT Treatment/Interventions ADLs/Self Care Home Management;Aquatic Therapy;Biofeedback;Cryotherapy;Electrical Stimulation;Iontophoresis 86m/ml Dexamethasone;Traction;Moist Heat;Ultrasound;DME Instruction;Gait  training;Stair training;Functional mobility training;Therapeutic activities;Therapeutic exercise;Balance training;Neuromuscular re-education;Patient/family education;Orthotic Fit/Training;Wheelchair mobility training;Manual techniques;Compression bandaging;Passive range of motion;Dry needling;Energy conservation;Taping;Splinting;Vestibular    PT Next Visit Plan step negotiation, turning directions to the L and R; re-test goals    PT Home Exercise Plan [place]gree with Plan of Care Patient             Patient will benefit from skilled therapeutic intervention in order to improve the following deficits and impairments:  Abnormal gait, Decreased activity tolerance, Decreased balance, Decreased coordination, Decreased endurance, Decreased mobility, Decreased range of motion, Decreased strength, Difficulty walking, Impaired perceived functional ability, Impaired sensation, Impaired tone, Impaired UE functional use, Improper body mechanics, Postural dysfunction, Cardiopulmonary status limiting activity, Impaired flexibility, Increased muscle spasms  Visit Diagnosis: Muscle weakness (generalized)  Unsteadiness on feet  Other abnormalities of gait and mobility  Multiple sclerosis exacerbation ([facility]     Problem List Patient Active Problem List   Diagnosis Date Noted   Hypoalbuminemia due to protein-calorie malnutrition (HCC)    Neurogenic bowel    Neurogenic bladder    Labile blood pressure    Neuropathic pain    Abscess of female pelvis    [place]    Radial styloid tenosynovitis 03/12/2018   Wheelchair confinement 02/27/2018   Localized  osteoporosis with current pathological fracture with routine healing 01/19/2017   Wrist fracture 01/16/2017   Sprain of ankle 03/23/2016   Closed fracture of lateral malleolus 03/16/2016   Health care maintenance 01/24/2016   Blood pressure elevated without history of HTN  10/25/2015   Essential hypertension 10/25/2015   Multiple sclerosis ([place]) 10/02/2015   Chronic left shoulder pain 07/19/2015   Multiple sclerosis exacerbation ([place]) 07/14/2015   MS (multiple sclerosis) ([place]) 11/26/2014   Increased body mass index 11/26/2014   HPV test positive 11/26/2014   Status post laparoscopic supracervical hysterectomy 11/26/2014   Galactorrhea 11/26/2014   Back ache 05/21/2014   Adiposity 05/21/2014   Disordered sleep 05/21/2014   Muscle spasticity 05/21/2014   Spasticity 05/21/2014   Calculus of kidney 34/07/7094   Renal colic 43/83/8184   Hypercholesteremia 08/19/2013   Hereditary and idiopathic neuropathy 08/19/2013   Hypercholesterolemia without hypertriglyceridemia 08/19/2013   Bladder infection, chronic 07/25/2012   Disorder of bladder function 07/25/2012   Incomplete bladder emptying 07/25/2012   Microscopic hematuria 07/25/2012   Right upper quadrant pain 07/25/2012    Janna Arch, PT, DPT  11/11/2020, 1:57 PM   [facility] MAIN [facility] SERVICES [address] [place], [place], [address] Phone: [phone]   Fax:  [phone]  Name: Lisa Williams MRN: [id number] Date of Birth: 11-09-1960

## 2020-11-16 ENCOUNTER — Ambulatory Visit: Payer: PPO

## 2020-11-18 ENCOUNTER — Ambulatory Visit: Payer: PPO

## 2020-11-21 ENCOUNTER — Emergency Department: Payer: PPO

## 2020-11-21 ENCOUNTER — Other Ambulatory Visit: Payer: Self-pay

## 2020-11-21 ENCOUNTER — Inpatient Hospital Stay
Admission: EM | Admit: 2020-11-21 | Discharge: 2020-12-02 | DRG: 178 | Disposition: A | Discharge status: Skilled Nursing Facility | Payer: PPO | Attending: Internal Medicine | Admitting: Internal Medicine

## 2020-11-21 ENCOUNTER — Encounter: Payer: Self-pay | Admitting: Emergency Medicine

## 2020-11-21 DIAGNOSIS — E46 Unspecified protein-calorie malnutrition: Secondary | ICD-10-CM | POA: Diagnosis not present

## 2020-11-21 DIAGNOSIS — M8580 Other specified disorders of bone density and structure, unspecified site: Secondary | ICD-10-CM | POA: Diagnosis present

## 2020-11-21 DIAGNOSIS — M549 Dorsalgia, unspecified: Secondary | ICD-10-CM | POA: Diagnosis not present

## 2020-11-21 DIAGNOSIS — Z87442 Personal history of urinary calculi: Secondary | ICD-10-CM | POA: Diagnosis not present

## 2020-11-21 DIAGNOSIS — M62838 Other muscle spasm: Secondary | ICD-10-CM | POA: Diagnosis not present

## 2020-11-21 DIAGNOSIS — R2689 Other abnormalities of gait and mobility: Secondary | ICD-10-CM | POA: Diagnosis not present

## 2020-11-21 DIAGNOSIS — R279 Unspecified lack of coordination: Secondary | ICD-10-CM | POA: Diagnosis not present

## 2020-11-21 DIAGNOSIS — Z83438 Family history of other disorder of lipoprotein metabolism and other lipidemia: Secondary | ICD-10-CM

## 2020-11-21 DIAGNOSIS — Z5320 Procedure and treatment not carried out because of patient's decision for unspecified reasons: Secondary | ICD-10-CM | POA: Diagnosis present

## 2020-11-21 DIAGNOSIS — Z90711 Acquired absence of uterus with remaining cervical stump: Secondary | ICD-10-CM | POA: Diagnosis not present

## 2020-11-21 DIAGNOSIS — Z8249 Family history of ischemic heart disease and other diseases of the circulatory system: Secondary | ICD-10-CM

## 2020-11-21 DIAGNOSIS — Z79899 Other long term (current) drug therapy: Secondary | ICD-10-CM

## 2020-11-21 DIAGNOSIS — Z888 Allergy status to other drugs, medicaments and biological substances status: Secondary | ICD-10-CM

## 2020-11-21 DIAGNOSIS — F40240 Claustrophobia: Secondary | ICD-10-CM | POA: Diagnosis present

## 2020-11-21 DIAGNOSIS — I10 Essential (primary) hypertension: Secondary | ICD-10-CM | POA: Diagnosis not present

## 2020-11-21 DIAGNOSIS — Z87891 Personal history of nicotine dependence: Secondary | ICD-10-CM

## 2020-11-21 DIAGNOSIS — T733XXA Exhaustion due to excessive exertion, initial encounter: Secondary | ICD-10-CM

## 2020-11-21 DIAGNOSIS — M25512 Pain in left shoulder: Secondary | ICD-10-CM | POA: Diagnosis not present

## 2020-11-21 DIAGNOSIS — I471 Supraventricular tachycardia: Secondary | ICD-10-CM | POA: Diagnosis not present

## 2020-11-21 DIAGNOSIS — M6281 Muscle weakness (generalized): Secondary | ICD-10-CM | POA: Diagnosis not present

## 2020-11-21 DIAGNOSIS — R509 Fever, unspecified: Secondary | ICD-10-CM | POA: Diagnosis present

## 2020-11-21 DIAGNOSIS — Z883 Allergy status to other anti-infective agents status: Secondary | ICD-10-CM | POA: Diagnosis not present

## 2020-11-21 DIAGNOSIS — M6259 Muscle wasting and atrophy, not elsewhere classified, multiple sites: Secondary | ICD-10-CM | POA: Diagnosis not present

## 2020-11-21 DIAGNOSIS — N319 Neuromuscular dysfunction of bladder, unspecified: Secondary | ICD-10-CM | POA: Diagnosis present

## 2020-11-21 DIAGNOSIS — Z7401 Bed confinement status: Secondary | ICD-10-CM | POA: Diagnosis not present

## 2020-11-21 DIAGNOSIS — D72810 Lymphocytopenia: Secondary | ICD-10-CM | POA: Diagnosis present

## 2020-11-21 DIAGNOSIS — G8929 Other chronic pain: Secondary | ICD-10-CM | POA: Diagnosis present

## 2020-11-21 DIAGNOSIS — Z6837 Body mass index (BMI) 37.0-37.9, adult: Secondary | ICD-10-CM | POA: Diagnosis not present

## 2020-11-21 DIAGNOSIS — R531 Weakness: Secondary | ICD-10-CM | POA: Diagnosis not present

## 2020-11-21 DIAGNOSIS — G35 Multiple sclerosis: Secondary | ICD-10-CM | POA: Diagnosis present

## 2020-11-21 DIAGNOSIS — R7989 Other specified abnormal findings of blood chemistry: Secondary | ICD-10-CM | POA: Diagnosis not present

## 2020-11-21 DIAGNOSIS — E669 Obesity, unspecified: Secondary | ICD-10-CM | POA: Diagnosis present

## 2020-11-21 DIAGNOSIS — N643 Galactorrhea not associated with childbirth: Secondary | ICD-10-CM | POA: Diagnosis present

## 2020-11-21 DIAGNOSIS — J069 Acute upper respiratory infection, unspecified: Secondary | ICD-10-CM

## 2020-11-21 DIAGNOSIS — U071 COVID-19: Principal | ICD-10-CM | POA: Diagnosis present

## 2020-11-21 DIAGNOSIS — Z6841 Body Mass Index (BMI) 40.0 and over, adult: Secondary | ICD-10-CM | POA: Diagnosis not present

## 2020-11-21 DIAGNOSIS — E7800 Pure hypercholesterolemia, unspecified: Secondary | ICD-10-CM | POA: Diagnosis present

## 2020-11-21 DIAGNOSIS — M810 Age-related osteoporosis without current pathological fracture: Secondary | ICD-10-CM | POA: Diagnosis not present

## 2020-11-21 DIAGNOSIS — R41841 Cognitive communication deficit: Secondary | ICD-10-CM | POA: Diagnosis not present

## 2020-11-21 DIAGNOSIS — G609 Hereditary and idiopathic neuropathy, unspecified: Secondary | ICD-10-CM | POA: Diagnosis present

## 2020-11-21 DIAGNOSIS — R5381 Other malaise: Secondary | ICD-10-CM | POA: Diagnosis not present

## 2020-11-21 DIAGNOSIS — M792 Neuralgia and neuritis, unspecified: Secondary | ICD-10-CM | POA: Diagnosis not present

## 2020-11-21 DIAGNOSIS — R1319 Other dysphagia: Secondary | ICD-10-CM | POA: Diagnosis not present

## 2020-11-21 LAB — LACTIC ACID, PLASMA: Lactic Acid, Venous: 1.2 mmol/L (ref 0.5–1.9)

## 2020-11-21 LAB — URINALYSIS, COMPLETE (UACMP) WITH MICROSCOPIC
Bacteria, UA: NONE SEEN
Bilirubin Urine: NEGATIVE
Glucose, UA: NEGATIVE mg/dL
Hgb urine dipstick: NEGATIVE
Ketones, ur: NEGATIVE mg/dL
Leukocytes,Ua: NEGATIVE
Nitrite: NEGATIVE
Protein, ur: NEGATIVE mg/dL
Specific Gravity, Urine: 1.024 (ref 1.005–1.030)
pH: 7 (ref 5.0–8.0)

## 2020-11-21 LAB — COMPREHENSIVE METABOLIC PANEL
ALT: 122 U/L — ABNORMAL HIGH (ref 0–44)
AST: 87 U/L — ABNORMAL HIGH (ref 15–41)
Albumin: 4 g/dL (ref 3.5–5.0)
Alkaline Phosphatase: 117 U/L (ref 38–126)
Anion gap: 9 (ref 5–15)
BUN: 20 mg/dL (ref 6–20)
CO2: 24 mmol/L (ref 22–32)
Calcium: 9 mg/dL (ref 8.9–10.3)
Chloride: 107 mmol/L (ref 98–111)
Creatinine, Ser: 0.64 mg/dL (ref 0.44–1.00)
GFR, Estimated: >60 mL/min (ref >60)
Glucose, Bld: 99 mg/dL (ref 70–99)
Potassium: 3.8 mmol/L (ref 3.5–5.1)
Sodium: 140 mmol/L (ref 135–145)
Total Bilirubin: 0.8 mg/dL (ref 0.3–1.2)
Total Protein: 7.3 g/dL (ref 6.5–8.1)

## 2020-11-21 LAB — PROTIME-INR
INR: 1.1 (ref 0.8–1.2)
Prothrombin Time: 13.7 seconds (ref 11.4–15.2)

## 2020-11-21 LAB — CBC WITH DIFFERENTIAL/PLATELET
Abs Immature Granulocytes: 0.05 10*3/uL (ref 0.00–0.07)
Basophils Absolute: 0 10*3/uL (ref 0.0–0.1)
Basophils Relative: 0 %
Eosinophils Absolute: 0.1 10*3/uL (ref 0.0–0.5)
Eosinophils Relative: 1 %
HCT: 38.1 % (ref 36.0–46.0)
Hemoglobin: 13.5 g/dL (ref 12.0–15.0)
Immature Granulocytes: 1 %
Lymphocytes Relative: 3 %
Lymphs Abs: 0.2 10*3/uL — ABNORMAL LOW (ref 0.7–4.0)
MCH: 27.3 pg (ref 26.0–34.0)
MCHC: 35.4 g/dL (ref 30.0–36.0)
MCV: 77.1 fL — ABNORMAL LOW (ref 80.0–100.0)
Monocytes Absolute: 0.8 10*3/uL (ref 0.1–1.0)
Monocytes Relative: 12 %
Neutro Abs: 5.6 10*3/uL (ref 1.7–7.7)
Neutrophils Relative %: 83 %
Platelets: 224 10*3/uL (ref 150–400)
RBC: 4.94 MIL/uL (ref 3.87–5.11)
RDW: 14.1 % (ref 11.5–15.5)
WBC: 6.7 10*3/uL (ref 4.0–10.5)
nRBC: 0 % (ref 0.0–0.2)

## 2020-11-21 LAB — BRAIN NATRIURETIC PEPTIDE: B Natriuretic Peptide: 65.6 pg/mL (ref 0.0–100.0)

## 2020-11-21 LAB — PROCALCITONIN: Procalcitonin: <0.1 ng/mL

## 2020-11-21 LAB — RESP PANEL BY RT-PCR (FLU A&B, COVID) ARPGX2
Influenza A by PCR: NEGATIVE
Influenza B by PCR: NEGATIVE
SARS Coronavirus 2 by RT PCR: POSITIVE — AB

## 2020-11-21 LAB — C-REACTIVE PROTEIN: CRP: 0.6 mg/dL (ref <1.0)

## 2020-11-21 LAB — APTT: aPTT: 32 seconds (ref 24–36)

## 2020-11-21 LAB — D-DIMER, QUANTITATIVE: D-Dimer, Quant: 0.5 ug/mL-FEU (ref 0.00–0.50)

## 2020-11-21 MED ORDER — SIPONIMOD FUMARATE 2 MG PO TABS
2.0000 mg | ORAL_TABLET | Freq: Every day | ORAL | Status: DC
  Administered 2020-11-27: 2 mg via ORAL
  Filled 2020-11-21: qty 1

## 2020-11-21 MED ORDER — HYDROCORTISONE NA SUCCINATE PF 100 MG IJ SOLR
100.0000 mg | Freq: Once | INTRAMUSCULAR | Status: AC
  Administered 2020-11-21: 100 mg via INTRAVENOUS
  Filled 2020-11-21: qty 2

## 2020-11-21 MED ORDER — SODIUM CHLORIDE 0.9 % IV SOLN
100.0000 mg | Freq: Every day | INTRAVENOUS | Status: AC
  Administered 2020-11-22 – 2020-11-25 (×4): 100 mg via INTRAVENOUS
  Filled 2020-11-21 (×4): qty 100

## 2020-11-21 MED ORDER — LACTATED RINGERS IV BOLUS (SEPSIS)
1000.0000 mL | Freq: Once | INTRAVENOUS | Status: AC
  Administered 2020-11-21: 1000 mL via INTRAVENOUS

## 2020-11-21 MED ORDER — TIZANIDINE HCL 2 MG PO TABS
2.0000 mg | ORAL_TABLET | Freq: Three times a day (TID) | ORAL | Status: DC
  Administered 2020-11-21 – 2020-11-26 (×17): 2 mg via ORAL
  Filled 2020-11-21 (×18): qty 1

## 2020-11-21 MED ORDER — GABAPENTIN 100 MG PO CAPS
100.0000 mg | ORAL_CAPSULE | Freq: Two times a day (BID) | ORAL | Status: DC
  Administered 2020-11-21 – 2020-12-02 (×22): 100 mg via ORAL
  Filled 2020-11-21 (×22): qty 1

## 2020-11-21 MED ORDER — DM-GUAIFENESIN ER 30-600 MG PO TB12
1.0000 | ORAL_TABLET | Freq: Two times a day (BID) | ORAL | Status: DC | PRN
  Administered 2020-11-22: 1 via ORAL
  Filled 2020-11-21: qty 1

## 2020-11-21 MED ORDER — SODIUM CHLORIDE 0.9 % IV SOLN
500.0000 mg | INTRAVENOUS | Status: DC
  Administered 2020-11-21: 500 mg via INTRAVENOUS
  Filled 2020-11-21: qty 500

## 2020-11-21 MED ORDER — ENOXAPARIN SODIUM 40 MG/0.4ML IJ SOSY: 40.0000 mg | PREFILLED_SYRINGE | INTRAMUSCULAR | Status: DC

## 2020-11-21 MED ORDER — LORAZEPAM 2 MG/ML IJ SOLN: 1.0000 mg | Freq: Once | INTRAMUSCULAR | Status: DC

## 2020-11-21 MED ORDER — SODIUM CHLORIDE 0.9 % IV SOLN
2.0000 g | INTRAVENOUS | Status: DC
  Administered 2020-11-21: 2 g via INTRAVENOUS
  Filled 2020-11-21: qty 20

## 2020-11-21 MED ORDER — ALBUTEROL SULFATE HFA 108 (90 BASE) MCG/ACT IN AERS
2.0000 | INHALATION_SPRAY | RESPIRATORY_TRACT | Status: DC | PRN
  Filled 2020-11-21: qty 6.7

## 2020-11-21 MED ORDER — HYDRALAZINE HCL 20 MG/ML IJ SOLN: 5.0000 mg | INTRAMUSCULAR | Status: DC | PRN

## 2020-11-21 MED ORDER — ACETAMINOPHEN 325 MG PO TABS
650.0000 mg | ORAL_TABLET | Freq: Four times a day (QID) | ORAL | Status: DC | PRN
  Administered 2020-11-21 – 2020-11-24 (×3): 650 mg via ORAL
  Filled 2020-11-21 (×4): qty 2

## 2020-11-21 MED ORDER — SODIUM CHLORIDE 0.9 % IV SOLN
200.0000 mg | Freq: Once | INTRAVENOUS | Status: AC
  Administered 2020-11-21: 200 mg via INTRAVENOUS
  Filled 2020-11-21: qty 40

## 2020-11-21 MED ORDER — ONDANSETRON HCL 4 MG/2ML IJ SOLN: 4.0000 mg | Freq: Three times a day (TID) | INTRAMUSCULAR | Status: DC | PRN

## 2020-11-21 MED ORDER — VITAMIN D 25 MCG (1000 UNIT) PO TABS
2000.0000 [IU] | ORAL_TABLET | Freq: Every day | ORAL | Status: DC
  Administered 2020-11-21 – 2020-12-02 (×12): 2000 [IU] via ORAL
  Filled 2020-11-21 (×12): qty 2

## 2020-11-21 MED ORDER — LACTATED RINGERS IV BOLUS (SEPSIS)
500.0000 mL | Freq: Once | INTRAVENOUS | Status: AC
  Administered 2020-11-21: 500 mL via INTRAVENOUS

## 2020-11-21 MED ORDER — LACTATED RINGERS IV SOLN: INTRAVENOUS | Status: DC

## 2020-11-21 MED ORDER — BACLOFEN 10 MG PO TABS
20.0000 mg | ORAL_TABLET | Freq: Three times a day (TID) | ORAL | Status: DC
  Administered 2020-11-21 – 2020-12-02 (×33): 20 mg via ORAL
  Filled 2020-11-21 (×36): qty 2

## 2020-11-21 MED ORDER — ENOXAPARIN SODIUM 60 MG/0.6ML IJ SOSY
0.5000 mg/kg | PREFILLED_SYRINGE | INTRAMUSCULAR | Status: DC
  Administered 2020-11-21 – 2020-12-01 (×11): 60 mg via SUBCUTANEOUS
  Filled 2020-11-21 (×11): qty 0.6

## 2020-11-21 MED ORDER — IRBESARTAN 150 MG PO TABS
150.0000 mg | ORAL_TABLET | Freq: Every day | ORAL | Status: DC
  Administered 2020-11-21 – 2020-12-02 (×12): 150 mg via ORAL
  Filled 2020-11-21 (×12): qty 1

## 2020-11-21 MED ORDER — OXYBUTYNIN CHLORIDE ER 10 MG PO TB24
10.0000 mg | ORAL_TABLET | Freq: Every day | ORAL | Status: DC
  Administered 2020-11-21 – 2020-12-02 (×12): 10 mg via ORAL
  Filled 2020-11-21 (×13): qty 1

## 2020-11-21 MED ORDER — LACTATED RINGERS IV BOLUS (SEPSIS)
250.0000 mL | Freq: Once | INTRAVENOUS | Status: AC
  Administered 2020-11-21: 250 mL via INTRAVENOUS

## 2020-11-21 MED ORDER — ACETAMINOPHEN 500 MG PO TABS
1000.0000 mg | ORAL_TABLET | Freq: Once | ORAL | Status: AC
  Administered 2020-11-21: 1000 mg via ORAL
  Filled 2020-11-21: qty 2

## 2020-11-21 NOTE — Progress Notes (Signed)
CODE SEPSIS - PHARMACY COMMUNICATION  **Broad Spectrum Antibiotics should be administered within 1 hour of Sepsis diagnosis**  Time Code Sepsis Called/Page Received: @ [phone]  Antibiotics Ordered: Ceftriaxone and azithromycin   Time of 1st antibiotic administration: 0813  Additional action taken by pharmacy: N/A   Gardner Candle, PharmD, BCPS Clinical Pharmacist 11/21/2020 7:49 AM

## 2020-11-21 NOTE — ED Provider Notes (Signed)
[facility] Emergency Department Provider Note  ________________________________   Event Date/Time   First MD Initiated Contact with Patient 11/21/20 [phone]     (approximate)  I have reviewed the triage vital signs and the nursing notes.   HISTORY  Chief Complaint No chief complaint on file.    HPI Lisa Williams is a 60 y.o. female who comes to the hospital complaining of nonproductive cough and a fever of 102 at home.  She is mildly tachycardic.  She has multiple sclerosis and while she was up walking yesterday today she cannot even get out of bed.  She says she gets like this with multiple sclerosis even with a mild cold.  Patient does not have any chest pain or shortness of breath dysuria abdominal pain or other complaints.  Just a cough and fever.        Past Medical History:  Diagnosis Date   Abdominal pain, right upper quadrant    Back pain    Calculus of kidney 12/09/2013   Chronic back pain    unspecified   Chronic left shoulder pain 07/19/2015   Complication of anesthesia    Functional disorder of bladder    other   Galactorrhea 11/26/2014   Chronic    Hereditary and idiopathic neuropathy 08/19/2013   HPV test positive    Hypercholesteremia 08/19/2013   Hypertension    Incomplete bladder emptying    Microscopic hematuria    MS (multiple sclerosis) (HCC)    Muscle spasticity 05/21/2014   Nonspecific findings on examination of urine    other   Osteopenia    PONV (postoperative nausea and vomiting)    Status post laparoscopic supracervical hysterectomy 11/26/2014   Tobacco user 11/26/2014   Wrist fracture     Patient Active Problem List   Diagnosis Date Noted   Hypoalbuminemia due to protein-calorie malnutrition (HCC)    Neurogenic bowel    Neurogenic bladder    Labile blood pressure    Neuropathic pain    Abscess of female pelvis    SVT (supraventricular tachycardia) (HCC)    Radial styloid tenosynovitis 03/12/2018    Wheelchair confinement 02/27/2018   Localized osteoporosis with current pathological fracture with routine healing 01/19/2017   Wrist fracture 01/16/2017   Sprain of ankle 03/23/2016   Closed fracture of lateral malleolus 03/16/2016   Health care maintenance 01/24/2016   Blood pressure elevated without history of HTN 10/25/2015   Essential hypertension 10/25/2015   Multiple sclerosis (HCC) 10/02/2015   Chronic left shoulder pain 07/19/2015   Multiple sclerosis exacerbation (HCC) 07/14/2015   MS (multiple sclerosis) (HCC) 11/26/2014   Increased body mass index 11/26/2014   HPV test positive 11/26/2014   Status post laparoscopic supracervical hysterectomy 11/26/2014   Galactorrhea 11/26/2014   Back ache 05/21/2014   Adiposity 05/21/2014   Disordered sleep 05/21/2014   Muscle spasticity 05/21/2014   Spasticity 05/21/2014   Calculus of kidney 12/09/2013   Renal colic 12/09/2013   Hypercholesteremia 08/19/2013   Hereditary and idiopathic neuropathy 08/19/2013   Hypercholesterolemia without hypertriglyceridemia 08/19/2013   Bladder infection, chronic 07/25/2012   Disorder of bladder function 07/25/2012   Incomplete bladder emptying 07/25/2012   Microscopic hematuria 07/25/2012   Right upper quadrant pain 07/25/2012    Past Surgical History:  Procedure Laterality Date   bilateral tubal ligation  1996   BREAST CYST EXCISION Left 2002   FRACTURE SURGERY     KNEE SURGERY     right   LAPAROSCOPIC SUPRACERVICAL  HYSTERECTOMY  08/05/2013   ORIF WRIST FRACTURE Left 01/17/2017   Procedure: OPEN REDUCTION INTERNAL FIXATION (ORIF) WRIST FRACTURE;  Surgeon: Lyndle HerrlichBowers, James R, MD;  Location: [facility] ORS;  Service: Orthopedics;  Laterality: Left;   RADIOLOGY WITH ANESTHESIA N/A 03/18/2020   Procedure: MRI WITH ANESTHESIA CERVICAL SPINE AND BRAIN  WITH AND WITHOUT CONTRAST;  Surgeon: Radiologist, Medication, MD;  Location: [facility] OR;  Service: Radiology;  Laterality: N/A;   TUBAL LIGATION Bilateral     VAGINAL HYSTERECTOMY  03/2006    Prior to Admission medications   Medication Sig Start Date End Date Taking? Authorizing Provider  acetaminophen (TYLENOL) 500 MG tablet Take 500-1,000 mg by mouth every 6 (six) hours as needed for moderate pain or headache.    [provider]  baclofen (LIORESAL) 20 MG tablet Take 1 tablet (20 mg total) by mouth 3 (three) times daily. 04/08/20   York SpanielWillis, Charles K, MD  Cholecalciferol 25 MCG (1000 UT) tablet Take 2,000 Units by mouth daily.    [provider]  gabapentin (NEURONTIN) 100 MG capsule Take 1 capsule (100 mg total) by mouth 2 (two) times daily. 04/08/20   York SpanielWillis, Charles K, MD  ibandronate (BONIVA) 150 MG tablet Take 150 mg by mouth every 30 (thirty) days.  05/12/19   [provider]  oxybutynin (DITROPAN-XL) 10 MG 24 hr tablet Take 1 tablet (10 mg total) by mouth daily. 04/06/20   Vanna ScotlandBrandon, Ashley, MD  predniSONE (DELTASONE) 5 MG tablet Begin taking 6 tablets daily, taper by one tablet every other day until off the medication. 08/05/20   York SpanielWillis, Charles K, MD  Siponimod Fumarate (MAYZENT) 2 MG TABS Take 2 mg by mouth daily.    York SpanielWillis, Charles K, MD  sulfamethoxazole-trimethoprim (BACTRIM DS) 800-160 MG tablet Take 1 tablet by mouth 2 (two) times daily. 08/11/20   York SpanielWillis, Charles K, MD  telmisartan (MICARDIS) 40 MG tablet Take 40 mg by mouth daily.    [provider]  tiZANidine (ZANAFLEX) 2 MG tablet TAKE 1 TABLET(2 MG) BY MOUTH THREE TIMES DAILY 11/01/20   York SpanielWillis, Charles K, MD    Allergies Amlodipine and Povidone iodine  Family History  Problem Relation Age of Onset   Sickle cell trait Sister    Hypertension Sister    Supraventricular tachycardia Sister    Hypertension Sister    Diabetes Maternal Grandmother    Liver cancer Maternal Grandmother    Diabetes Maternal Aunt    Breast cancer Maternal Aunt        great MAT   Ovarian cancer Paternal Grandmother    Nephrolithiasis Father    Hypertension Father     Prostate cancer Father    Kidney disease Father    Heart failure Father    Hypertension Sister    Osteoarthritis Mother    Hypertension Mother    Hyperlipidemia Mother    GU problems Neg Hx    Urolithiasis Neg Hx     Social History Social History   Tobacco Use   Smoking status: Former    Packs/day: 0.50    Types: Cigarettes   Smokeless tobacco: Never  Vaping Use   Vaping Use: Never used  Substance Use Topics   Alcohol use: No    Alcohol/week: 0.0 standard drinks   Drug use: No    Review of Systems  Constitutional: fever/chills Eyes: No visual changes. ENT: No sore throat. Cardiovascular: Denies chest pain. Respiratory: Denies shortness of breath. Gastrointestinal: No abdominal pain.  No nausea, no vomiting.  No diarrhea.  No constipation. Genitourinary: Negative for dysuria. Musculoskeletal: Negative for back pain. Skin: Negative for rash. Neurological: Negative for headaches,   ____________________________________________   PHYSICAL EXAM:  VITAL SIGNS: ED Triage Vitals  Enc Vitals Group     BP      Pulse      Resp      Temp      Temp src      SpO2      Weight      Height      Head Circumference      Peak Flow      Pain Score      Pain Loc      Pain Edu?      Excl. in GC?     Constitutional: Alert and oriented. Well appearing and in no acute distress.  But diffusely weak and unable to get out of bed Eyes: Conjunctivae are normal. PER  Head: Atraumatic. Nose: No congestion/rhinnorhea. Mouth/Throat: Mucous membranes are moist.  Oropharynx non-erythematous. Neck: No stridor.  Cardiovascular: Normal rate, regular rhythm. Grossly normal heart sounds.  Good peripheral circulation. Respiratory: Normal respiratory effort.  No retractions. Lungs CTAB. Gastrointestinal: Soft and nontender. No distention. No abdominal bruits.  Musculoskeletal: No lower extremity tenderness nor edema.  No joint effusions. Neurologic:  Normal speech and language.  Diffuse  weakness which is new today but happens frequently with infection Skin:  Skin is warm, dry and intact. No rash noted. Psychiatric: Mood and affect are normal. Speech and behavior are normal.  ____________________________________________   LABS (all labs ordered are listed, but only abnormal results are displayed)  Labs Reviewed - No data to display ____________________________________________  EKG  ______________________________________  RADIOLOGY Jill Poling, personally viewed and evaluated these images (plain radiographs) as part of my medical decision making, as well as reviewing the written report by the radiologist.  ED MD interpretation:    Official radiology report(s): No results found.  ____________________________________________   PROCEDURES  Procedure(s) performed (including Critical Care): Critical care time half an hour.  This includes time spent examining patient talking to patient's family reviewing old records both from this hospital and in care everywhere and also speaking to the neurologist and then the hospitalist.  Procedures   ____________________________________________   INITIAL IMPRESSION / ASSESSMENT AND PLAN / ED COURS Patient reports she usually gets Solu-Medrol for her exacerbations with illness.  She says 2 mg.  I am looking at her old records and she is gotten 1000 mg on 1 IV visit Hande prednisone taper on the other visit p.o.  I will consult neurology and see what they feel she should get for this.  Patient will have to come in if she is totally unable to care for herself at home right now.      Discussed with Dr Wilford Corner, on-call for neurology.  He wants me to get a MRI with and without contrast of the brain and spinal cord to see if she has any enhancement of her MS.  We will do this after we get her stabilized for her sepsis.  We will have to get her in the hospital either way.  Depending on the results of the MRI we will may not start  steroids.     ----------------------------------------- 9:19 AM on 11/21/2020 ----------------------------------------- Patient's COVID test is positive.  I have consulted the hospitalist for admission.  So far everything else has been negative.  Procalcitonin is pending.  Patient says she needed total anesthesia to get  her MRI recently here.  I have ordered Ativan for her.  We can always give her more Ativan if need be.     ____________________________________________   FINAL CLINICAL IMPRESSION(S) / ED DIAGNOSES  Final diagnoses:  None     ED Discharge Orders     None        Note:  This document was prepared using Dragon voice recognition software and may include unintentional dictation errors.    Arnaldo Natal, MD 11/21/20 [phone]

## 2020-11-21 NOTE — ED Notes (Signed)
Per MRI, pt recently got a MRI in about 8 months ago and needed general anesthesia for sedation. Dr. Darnelle Catalan, MD notified and orders for Ativan were placed. After having a conversation with the pt, she is wishing to hold off on the MRI at this time. Dr. Darnelle Catalan made aware.

## 2020-11-21 NOTE — ED Notes (Signed)
Pt brief soiled with urine. Pt changed and purewick placed. Pt is a 2 person assist.

## 2020-11-21 NOTE — ED Triage Notes (Signed)
Pt via EMS from home. Pt c/o weakness, cough, chills, and fever since last night. Pt states she took her temp this AM and it was 100.2. Pt states she woke up and was unable to move and she thinks is an MS flair up. Pt is A&Ox4 and NAD.

## 2020-11-21 NOTE — H&P (Signed)
History and Physical    Lisa Williams GYJ:[id number] DOB: March 31, 1961 DOA: 11/21/2020  Referring MD/NP/PA:   PCP: Lauro Regulus, MD   Patient coming from:  The patient is coming from home.  At baseline, pt is independent for most of ADL.        Chief Complaint: fever, chills, cough and weakness  HPI: Lisa Williams is a 60 y.o. female with medical history significant of multiple sclerosis, hypertension, hyperlipidemia, tobacco abuse, chronic galactorrhea, neurogenic bladder, SVT, who presents with fever, chills, cough, weakness.  Patient states that her symptoms started last night, including fever, chills, cough with little mucus production. She stated that she had temperature 100.2 at home.  She denies shortness of breath.  She states she has intermittent minimal front chest pain, currently no chest pain.  She states that normally she can use walker to walk around, but she developed weakness, now has difficulty walking today.  No facial droop or slurred speech.  No nausea vomiting, diarrhea or abdominal pain.  No symptoms of UTI.  ED Course: pt was found to have WBC 6.7, positive COVID test, negative urinalysis, lactic acid 1.2, INR 1.1, PTT 32, electrolytes renal function okay, blood pressure 165/85, heart rate 104, RR 20, oxygen saturation 89% on room air.  Chest x-ray negative for infiltration.  Patient is admitted to MedSurg bed as inpatient.  Dr. Wilford Corner of neurology is consulted.  Review of Systems:   General: has fevers, chills, no body weight gain, has fatigue HEENT: no blurry vision, hearing changes or sore throat Respiratory: no dyspnea, has coughing, no wheezing CV: has minimal chest pain, no palpitations GI: no nausea, vomiting, abdominal pain, neurogenic bladder, constipation GU: no dysuria, burning on urination, increased urinary frequency, hematuria  Ext: no leg edema Neuro: no unilateral tingling, no vision change or hearing loss. Has weakness in  legs Skin: no rash, no skin tear. MSK: No muscle spasm, no deformity, no limitation of range of movement in spin Heme: No easy bruising.  Travel history: No recent long distant travel.  Allergy:  Allergies  Allergen Reactions   Amlodipine Swelling   Povidone Iodine Rash    Past Medical History:  Diagnosis Date   Abdominal pain, right upper quadrant    Back pain    Calculus of kidney 12/09/2013   Chronic back pain    unspecified   Chronic left shoulder pain 07/19/2015   Complication of anesthesia    Functional disorder of bladder    other   Galactorrhea 11/26/2014   Chronic    Hereditary and idiopathic neuropathy 08/19/2013   HPV test positive    Hypercholesteremia 08/19/2013   Hypertension    Incomplete bladder emptying    Microscopic hematuria    MS (multiple sclerosis) (HCC)    Muscle spasticity 05/21/2014   Nonspecific findings on examination of urine    other   Osteopenia    PONV (postoperative nausea and vomiting)    Status post laparoscopic supracervical hysterectomy 11/26/2014   Tobacco user 11/26/2014   Wrist fracture     Past Surgical History:  Procedure Laterality Date   bilateral tubal ligation  1996   BREAST CYST EXCISION Left 2002   FRACTURE SURGERY     KNEE SURGERY     right   LAPAROSCOPIC SUPRACERVICAL HYSTERECTOMY  08/05/2013   ORIF WRIST FRACTURE Left 01/17/2017   Procedure: OPEN REDUCTION INTERNAL FIXATION (ORIF) WRIST FRACTURE;  Surgeon: Lyndle Herrlich, MD;  Location: [facility] ORS;  Service: Orthopedics;  Laterality: Left;   RADIOLOGY WITH ANESTHESIA N/A 03/18/2020   Procedure: MRI WITH ANESTHESIA CERVICAL SPINE AND BRAIN  WITH AND WITHOUT CONTRAST;  Surgeon: Radiologist, Medication, MD;  Location: [facility] OR;  Service: Radiology;  Laterality: N/A;   TUBAL LIGATION Bilateral    VAGINAL HYSTERECTOMY  03/2006    Social History:  reports that she has quit smoking. Her smoking use included cigarettes. She smoked an average of .5 packs per day. She has never used  smokeless tobacco. She reports that she does not drink alcohol and does not use drugs.  Family History:  Family History  Problem Relation Age of Onset   Sickle cell trait Sister    Hypertension Sister    Supraventricular tachycardia Sister    Hypertension Sister    Diabetes Maternal Grandmother    Liver cancer Maternal Grandmother    Diabetes Maternal Aunt    Breast cancer Maternal Aunt        great MAT   Ovarian cancer Paternal Grandmother    Nephrolithiasis Father    Hypertension Father    Prostate cancer Father    Kidney disease Father    Heart failure Father    Hypertension Sister    Osteoarthritis Mother    Hypertension Mother    Hyperlipidemia Mother    GU problems Neg Hx    Urolithiasis Neg Hx      Prior to Admission medications   Medication Sig Start Date End Date Taking? Authorizing Provider  acetaminophen (TYLENOL) 500 MG tablet Take 500-1,000 mg by mouth every 6 (six) hours as needed for moderate pain or headache.    [provider]  baclofen (LIORESAL) 20 MG tablet Take 1 tablet (20 mg total) by mouth 3 (three) times daily. 04/08/20   York SpanielWillis, Charles K, MD  Cholecalciferol 25 MCG (1000 UT) tablet Take 2,000 Units by mouth daily.    [provider]  gabapentin (NEURONTIN) 100 MG capsule Take 1 capsule (100 mg total) by mouth 2 (two) times daily. 04/08/20   York SpanielWillis, Charles K, MD  ibandronate (BONIVA) 150 MG tablet Take 150 mg by mouth every 30 (thirty) days.  05/12/19   [provider]  oxybutynin (DITROPAN-XL) 10 MG 24 hr tablet Take 1 tablet (10 mg total) by mouth daily. 04/06/20   Vanna ScotlandBrandon, Ashley, MD  predniSONE (DELTASONE) 5 MG tablet Begin taking 6 tablets daily, taper by one tablet every other day until off the medication. 08/05/20   York SpanielWillis, Charles K, MD  Siponimod Fumarate (MAYZENT) 2 MG TABS Take 2 mg by mouth daily.    York SpanielWillis, Charles K, MD  sulfamethoxazole-trimethoprim (BACTRIM DS) 800-160 MG tablet Take 1 tablet by mouth 2 (two) times  daily. 08/11/20   York SpanielWillis, Charles K, MD  telmisartan (MICARDIS) 40 MG tablet Take 40 mg by mouth daily.    [provider]  tiZANidine (ZANAFLEX) 2 MG tablet TAKE 1 TABLET(2 MG) BY MOUTH THREE TIMES DAILY 11/01/20   York SpanielWillis, Charles K, MD    Physical Exam: Vitals:   11/21/20 1056 11/21/20 1100 11/21/20 1130 11/21/20 1227  BP:  (!) 156/76 (!) 156/92 (!) 141/75  Pulse:  93 92 93  Resp:  17 14 18   Temp: 98.7 F (37.1 C)   99.3 F (37.4 C)  TempSrc: Oral   Oral  SpO2:  98% 98% 99%  Weight:      Height:       General: Not in acute distress HEENT:       Eyes: PERRL, EOMI, no scleral  icterus.       ENT: No discharge from the ears and nose, no pharynx injection, no tonsillar enlargement.        Neck: No JVD, no bruit, no mass felt. Heme: No neck lymph node enlargement. Cardiac: S1/S2, RRR, No murmurs, No gallops or rubs. Respiratory: No rales, wheezing, rhonchi or rubs. GI: Soft, nondistended, nontender, no rebound pain, no organomegaly, BS present. GU: No hematuria Ext: No pitting leg edema bilaterally. 1+DP/PT pulse bilaterally. Musculoskeletal: No joint deformities, No joint redness or warmth, no limitation of ROM in spin. Skin: No rashes.  Neuro: Alert, oriented X3, cranial nerves II-XII grossly intact. Has weakness in legs. Psych: Patient is not psychotic, no suicidal or hemocidal ideation.    Labs on Admission: I have personally reviewed following labs and imaging studies  CBC: Recent Labs  Lab 11/21/20 0756  WBC 6.7  NEUTROABS 5.6  HGB 13.5  HCT 38.1  MCV 77.1*  PLT 224   Basic Metabolic Panel: Recent Labs  Lab 11/21/20 0756  NA 140  K 3.8  CL 107  CO2 24  GLUCOSE 99  BUN 20  CREATININE 0.64  CALCIUM 9.0   GFR: Estimated Creatinine Clearance: 109 mL/min (by C-G formula based on SCr of 0.64 mg/dL). Liver Function Tests: Recent Labs  Lab 11/21/20 0756  AST 87*  ALT 122*  ALKPHOS 117  BILITOT 0.8  PROT 7.3  ALBUMIN 4.0   No results for  input(s): LIPASE, AMYLASE in the last 168 hours. No results for input(s): AMMONIA in the last 168 hours. Coagulation Profile: Recent Labs  Lab 11/21/20 0756  INR 1.1   Cardiac Enzymes: No results for input(s): CKTOTAL, CKMB, CKMBINDEX, TROPONINI in the last 168 hours. BNP (last 3 results) No results for input(s): PROBNP in the last 8760 hours. HbA1C: No results for input(s): HGBA1C in the last 72 hours. CBG: No results for input(s): GLUCAP in the last 168 hours. Lipid Profile: No results for input(s): CHOL, HDL, LDLCALC, TRIG, CHOLHDL, LDLDIRECT in the last 72 hours. Thyroid Function Tests: No results for input(s): TSH, T4TOTAL, FREET4, T3FREE, THYROIDAB in the last 72 hours. Anemia Panel: No results for input(s): VITAMINB12, FOLATE, FERRITIN, TIBC, IRON, RETICCTPCT in the last 72 hours. Urine analysis:    Component Value Date/Time   COLORURINE YELLOW (A) 11/21/2020 0746   APPEARANCEUR HAZY (A) 11/21/2020 0746   APPEARANCEUR Hazy (A) 10/05/2020 1411   LABSPEC 1.024 11/21/2020 0746   LABSPEC 1.030 07/31/2011 1838   PHURINE 7.0 11/21/2020 0746   GLUCOSEU NEGATIVE 11/21/2020 0746   GLUCOSEU Negative 07/31/2011 1838   HGBUR NEGATIVE 11/21/2020 0746   BILIRUBINUR NEGATIVE 11/21/2020 0746   BILIRUBINUR Negative 10/05/2020 1411   BILIRUBINUR Negative 07/31/2011 1838   KETONESUR NEGATIVE 11/21/2020 0746   PROTEINUR NEGATIVE 11/21/2020 0746   NITRITE NEGATIVE 11/21/2020 0746   LEUKOCYTESUR NEGATIVE 11/21/2020 0746   LEUKOCYTESUR Negative 07/31/2011 1838   Sepsis Labs: @LABRCNTIP (procalcitonin:4,lacticidven:4) ) Recent Results (from the past 240 hour(s))  Resp Panel by RT-PCR (Flu A&B, Covid) Nasopharyngeal Swab     Status: Abnormal   Collection Time: 11/21/20  7:46 AM   Specimen: Nasopharyngeal Swab; Nasopharyngeal(NP) swabs in vial transport medium  Result Value Ref Range Status   SARS Coronavirus 2 by RT PCR POSITIVE (A) NEGATIVE Final    Comment: RESULT CALLED TO, READ  BACK BY AND VERIFIED WITH: ASHLEY MURRAY AT 0911 11/21/20.PMF (NOTE) SARS-CoV-2 target nucleic acids are DETECTED.  The SARS-CoV-2 RNA is generally detectable in upper respiratory specimens during the  acute phase of infection. Positive results are indicative of the presence of the identified virus, but do not rule out bacterial infection or co-infection with other pathogens not detected by the test. Clinical correlation with patient history and other diagnostic information is necessary to determine patient infection status. The expected result is Negative.  Fact Sheet for Patients: BloggerCourse.com  Fact Sheet for Healthcare Providers: SeriousBroker.it  This test is not yet approved or cleared by the [place] FDA and  has been authorized for detection and/or diagnosis of SARS-CoV-2 by FDA under an Emergency Use Authorization (EUA).  This EUA will remain in effect (meaning this test can be  used) for the duration of  the COVID-19 declaration under Section 564(b)(1) of the Act, 21 U.S.C. section 360bbb-3(b)(1), unless the authorization is terminated or revoked sooner.     Influenza A by PCR NEGATIVE NEGATIVE Final   Influenza B by PCR NEGATIVE NEGATIVE Final    Comment: (NOTE) The Xpert Xpress SARS-CoV-2/FLU/RSV plus assay is intended as an aid in the diagnosis of influenza from Nasopharyngeal swab specimens and should not be used as a sole basis for treatment. Nasal washings and aspirates are unacceptable for Xpert Xpress SARS-CoV-2/FLU/RSV testing.  Fact Sheet for Patients: BloggerCourse.com  Fact Sheet for Healthcare Providers: SeriousBroker.it  This test is not yet approved or cleared by the [place] FDA and has been authorized for detection and/or diagnosis of SARS-CoV-2 by FDA under an Emergency Use Authorization (EUA). This EUA will remain in effect (meaning  this test can be used) for the duration of the COVID-19 declaration under Section 564(b)(1) of the Act, 21 U.S.C. section 360bbb-3(b)(1), unless the authorization is terminated or revoked.  Performed at [facility], [address] [place], [place] [address]      Radiological Exams on Admission: DG Chest [place] 1 View  Result Date: 11/21/2020 CLINICAL DATA:  60 year old female with possible sepsis. Generalized weakness. EXAM: PORTABLE CHEST 1 VIEW COMPARISON:  Portable chest 01/16/2017 and earlier. FINDINGS: Portable AP upright view at 0756 hours. Mildly lower lung volumes. Normal cardiac size and mediastinal contours. Visualized tracheal air column is within normal limits. Mild eventration of the right hemidiaphragm, normal variant. Allowing for portable technique the lungs are clear. No pneumothorax or pleural effusion. Mild scoliosis. No acute osseous abnormality identified. Paucity of bowel gas in the upper abdomen. IMPRESSION: Lower lung volumes.  Otherwise negative portable chest. Electronically Signed   By: Odessa Fleming M.D.   On: 11/21/2020 08:21     EKG: I have personally reviewed.  Sinus rhythm, QTC 407, nonspecific to change   Assessment/Plan Principal Problem:   Acute respiratory disease due to COVID-19 virus Active Problems:   MS (multiple sclerosis) (HCC)   Essential hypertension   Neurogenic bladder   Weakness   Acute respiratory disease due to COVID-19 virus: Chest x-ray negative for infiltration.  No oxygen desaturation  Patient had fever at home, chills and cough.  -will admit to med-surg bed as inpt -Remdesivir per pharm -Bronchodilators -PRN Mucinex for cough -f/u Blood culture -Gentle IV fluid: 1.75 L of LR in ED -f/u inflammatory markers -will give 100 mg of Solu-Cortef as stress dose since patient took steroid often.  MS (multiple sclerosis) and weakness: Not sure if patient's weakness is due to MS exacerbation.  Neurology, Dr. Wilford Corner is consulted, he  recommended to get MRI of brain and C-spine.  Patient has claustrophobia and will need anesthesia. She refused MRI today.  She wants to be treated for COVID  infection first, hoping her weakness will improve. -will continue home Siponimod Fumarate (ok to let pt take her own meds) -continue home baclofen, Neurontin, tizanidine -PT/OT  Essential hypertension -Irbesartan -IV hydralazine as needed  Neurogenic bladder -Oxybutynin      DVT ppx: SQ Lovenox Code Status: Full code Family Communication: Yes, patient's daughter by phone Disposition Plan:  Anticipate discharge back to previous environment Consults called:  Dr. Wilford Corner of neuro Admission status and Level of care: Med-Surg:   as inpt    Status is: Inpatient  Remains inpatient appropriate because:Inpatient level of care appropriate due to severity of illness  Dispo: The patient is from: Home              Anticipated d/c is to: Home              Patient currently is not medically stable to d/c.   Difficult to place patient No            Date of Service 11/21/2020    Lorretta Harp [facility]   If 7PM-7AM, please contact night-coverage [email or web address] 11/21/2020, 3:11 PM

## 2020-11-21 NOTE — ED Notes (Signed)
Pt presents to the ED BIB EMS, pt states that she has been having chills all night, pt woke up this morning and was weak could not get out of bed. Pt also states she had a fever of 100.2. pt just travelled from [place] and have been around a lot of people. Pt does have a hx of MS and pt believes this may be a flair up of her MS. Pt also c/o cough. Pt is A&Ox4 and NAD. Pt is tachycardiac with a HR of the 100s. All other VS stable.

## 2020-11-21 NOTE — Progress Notes (Signed)
Remdesivir - Pharmacy Brief Note   O:  ALT: 122 CXR: Lower lung volumes.  Otherwise negative portable chest. SpO2: 98% on RA   A/P:  Remdesivir 200 mg IVPB once followed by 100 mg IVPB daily x 4 days.   Gardner Candle, PharmD, BCPS Clinical Pharmacist 11/21/2020 10:00 AM

## 2020-11-21 NOTE — Progress Notes (Signed)
PHARMACIST - PHYSICIAN COMMUNICATION  CONCERNING:  Enoxaparin (Lovenox) for DVT Prophylaxis    RECOMMENDATION: Patient was prescribed enoxaprin 40mg  q24 hours for VTE prophylaxis.   Filed Weights   11/21/20 0804  Weight: 121.7 kg (268 lb 6.4 oz)    Body mass index is 37.43 kg/m.  Estimated Creatinine Clearance: 109 mL/min (by C-G formula based on SCr of 0.64 mg/dL).  Based on [facility] policy patient is candidate for enoxaparin 0.5mg /kg TBW SQ every 24 hours based on BMI being >30.  DESCRIPTION: Pharmacy has adjusted enoxaparin dose per [facility] policy.  Patient is now receiving enoxaparin 60 mg every 24 hours   [facility], PharmD, BCPS Clinical Pharmacist 11/21/2020 12:37 PM

## 2020-11-21 NOTE — ED Notes (Signed)
Pt states that she has a headache. Verbal orders for acetaminophen placed.

## 2020-11-21 NOTE — Plan of Care (Signed)
Got a call from Dr. Juliette Alcide to discuss this patient.  History of multiple sclerosis, currently on siponimod.  Coming in with worsening weakness. I recommended MRI of the brain and C-spine with and without contrast to evaluate for any enhancing lesions which would give [place] the impetus to treat with high-dose IV steroids.  Also requested infectious work-up to be done. Infectious work-up noted to show that she is COVID-positive. I would still recommend obtaining the MRI, and if it shows enhancement, treatment with high-dose steroids.  Otherwise treatment of COVID-19 infection should help some with the symptoms-this might, in absence of abnormal enhancement, be a pseudo exacerbation of symptoms in the setting of a systemic illness.  Please call if the imaging is abnormal.  We will see in formal consultation at that time.    -- Milon Dikes, MD Neurologist [facility] Pager: [phone]

## 2020-11-21 NOTE — Sepsis Progress Note (Signed)
Sepsis protocol is being followed by [facility]. 

## 2020-11-21 NOTE — ED Notes (Signed)
Explained to pt's sister that due to pt being COVID positive she will not be allowed to have pt when she gets a bed upstairs. Sister and pt verbalized understanding at this time. Charge RN made aware that family was let back prior to this result.

## 2020-11-22 LAB — URINE CULTURE: Culture: 10000 — AB

## 2020-11-22 LAB — COMPREHENSIVE METABOLIC PANEL
ALT: 104 U/L — ABNORMAL HIGH (ref 0–44)
AST: 70 U/L — ABNORMAL HIGH (ref 15–41)
Albumin: 3.2 g/dL — ABNORMAL LOW (ref 3.5–5.0)
Alkaline Phosphatase: 97 U/L (ref 38–126)
Anion gap: 5 (ref 5–15)
BUN: 14 mg/dL (ref 6–20)
CO2: 24 mmol/L (ref 22–32)
Calcium: 8.6 mg/dL — ABNORMAL LOW (ref 8.9–10.3)
Chloride: 109 mmol/L (ref 98–111)
Creatinine, Ser: 0.65 mg/dL (ref 0.44–1.00)
GFR, Estimated: >60 mL/min (ref >60)
Glucose, Bld: 103 mg/dL — ABNORMAL HIGH (ref 70–99)
Potassium: 3.6 mmol/L (ref 3.5–5.1)
Sodium: 138 mmol/L (ref 135–145)
Total Bilirubin: 0.7 mg/dL (ref 0.3–1.2)
Total Protein: 6.1 g/dL — ABNORMAL LOW (ref 6.5–8.1)

## 2020-11-22 LAB — CBC WITH DIFFERENTIAL/PLATELET
Abs Immature Granulocytes: 0.03 10*3/uL (ref 0.00–0.07)
Basophils Absolute: 0 10*3/uL (ref 0.0–0.1)
Basophils Relative: 0 %
Eosinophils Absolute: 0 10*3/uL (ref 0.0–0.5)
Eosinophils Relative: 0 %
HCT: 34.5 % — ABNORMAL LOW (ref 36.0–46.0)
Hemoglobin: 12.5 g/dL (ref 12.0–15.0)
Immature Granulocytes: 1 %
Lymphocytes Relative: 7 %
Lymphs Abs: 0.3 10*3/uL — ABNORMAL LOW (ref 0.7–4.0)
MCH: 27.4 pg (ref 26.0–34.0)
MCHC: 36.2 g/dL — ABNORMAL HIGH (ref 30.0–36.0)
MCV: 75.7 fL — ABNORMAL LOW (ref 80.0–100.0)
Monocytes Absolute: 0.6 10*3/uL (ref 0.1–1.0)
Monocytes Relative: 16 %
Neutro Abs: 2.9 10*3/uL (ref 1.7–7.7)
Neutrophils Relative %: 76 %
Platelets: 196 10*3/uL (ref 150–400)
RBC: 4.56 MIL/uL (ref 3.87–5.11)
RDW: 14.2 % (ref 11.5–15.5)
WBC: 3.8 10*3/uL — ABNORMAL LOW (ref 4.0–10.5)
nRBC: 0 % (ref 0.0–0.2)

## 2020-11-22 LAB — C-REACTIVE PROTEIN: CRP: 0.9 mg/dL (ref <1.0)

## 2020-11-22 LAB — HIV ANTIBODY (ROUTINE TESTING W REFLEX): HIV Screen 4th Generation wRfx: NONREACTIVE

## 2020-11-22 MED ORDER — BENZONATATE 100 MG PO CAPS
200.0000 mg | ORAL_CAPSULE | Freq: Three times a day (TID) | ORAL | Status: DC
  Administered 2020-11-22 – 2020-11-29 (×22): 200 mg via ORAL
  Filled 2020-11-22 (×25): qty 2

## 2020-11-22 NOTE — Progress Notes (Signed)
Inpatient Rehab Admissions Coordinator:   CIR consult received. Patients are eligible to be considered for admit to the [facility] when cleared from airborne precautions by Acute MD, or Infectious Disease MD.  Otherwise, they will need to be >20 days from their positive test with recovery/improvement in symptoms or 2 negative tests.  I will follow for potential admit once off of precautions.    Megan Salon, MS, CCC-SLP Rehab Admissions Coordinator  [phone] (celll) [phone] (office)

## 2020-11-22 NOTE — Progress Notes (Signed)
PROGRESS NOTE  Lisa Williams  DOB: Jan 24, 1961  PCP: Lauro Regulus, MD LZJ:[id number]  DOA: 11/21/2020  LOS: 1 day  Hospital Day: 2   Chief Complaint  Patient presents with   Weakness    Brief narrative: Lisa Williams is a 60 y.o. female with PMH significant for multiple sclerosis, HTN, HLD, SVT, tobacco abuse, chronic galactorrhea, neurogenic bladder. Patient presented to the ED on 7/17 from home with complaint of fever up to 100.2, chills, cough mostly dry, intermittent chest pain and generalized weakness.   In the ED, patient was afebrile, 89% on room air Labs with white count 6.7, COVID PCR positive Chest x-ray negative for infiltration Admitted to hospitalist service. Neurology was also consulted because of suspicion of worsening multiple sclerosis deficits   Subjective: Patient was seen and examined this morning.  Pleasant middle-aged African-American female.  Not in distress.  Not on supplemental oxygen.  Complains of inability to bring up phlegm. Continues to feel weak.  Assessment/Plan: COVID infection -Presented with fever, chest pain, dry cough -COVID test: PCR positive in the ED -Chest imaging: No infiltrates -Treatment: Currently on a course of IV remdesivir, bronchodilators, Mucinex. -Progression: Not on supplemental oxygen -Gastrin level negative.  Not on antibiotics  -Supportive care: Vitamin C, Zinc, PRN inhalers, Tylenol, Antitussives (benzonatate/ Mucinex/Tussionex).   -Encouraged incentive spirometry, prone position, out of bed and early mobilization as much as possible -WBC and inflammatory markers trend as below.  Recent Labs  Lab 11/21/20 0746 11/21/20 0756 11/21/20 0925 11/21/20 1259 11/22/20 0437  SARSCOV2NAA POSITIVE*  --   --   --   --   WBC  --  6.7  --   --  3.8*  LATICACIDVEN 1.2  --   --   --   --   PROCALCITON  --   --  <0.10  --   --   DDIMER  --   --   --  0.50  --   CRP  --   --   --  0.6 0.9  ALT  --   122*  --   --  104*   Generalized weakness in the setting of multiple sclerosis  -Generalized weakness is likely because of MS exacerbation due to infection.  Less likely to be primary flareup of MS deficits.   -Neurology consulted.  Recommend MRI brain and C-spine.  However patient has claustrophobia and is refusing to get MRI.  She hopes to get stronger with treatment of COVID.  -continue home Siponimod Fumarate (ok to let pt take her own meds) -continue home baclofen, Neurontin, tizanidine -PT/OT ordered.   Essential hypertension -Continue to monitor blood pressure on irbesartan -IV hydralazine as needed   Neurogenic bladder -Oxybutynin   Mobility: Encourage ambulation with PT Code Status:   Code Status: Full Code  Nutritional status: Body mass index is 37.43 kg/m.     Diet:  Diet Order             Diet Heart Room service appropriate? Yes; Fluid consistency: Thin  Diet effective now                  DVT prophylaxis: Lovenox subcu    Antimicrobials: Remdesivir, not on antibiotics Fluid: None Consultants: None Family Communication: None at bedside  Status is: Inpatient  Remains inpatient appropriate because: Needs IV treatment for COVID  Dispo: The patient is from: Home              Anticipated d/c is to:  CIR recommended by PT              Patient currently is not medically stable to d/c.   Difficult to place patient No     Infusions:   remdesivir 100 mg in NS 100 mL 100 mg (11/22/20 1002)    Scheduled Meds:  baclofen  20 mg Oral TID   benzonatate  200 mg Oral TID   cholecalciferol  2,000 Units Oral Daily   enoxaparin (LOVENOX) injection  0.5 mg/kg Subcutaneous Q24H   gabapentin  100 mg Oral BID   irbesartan  150 mg Oral Daily   oxybutynin  10 mg Oral Daily   Siponimod Fumarate  2 mg Oral Daily   tiZANidine  2 mg Oral TID    Antimicrobials: Anti-infectives (From admission, onward)    Start     Dose/Rate Route Frequency Ordered Stop    11/22/20 1000  remdesivir 100 mg in sodium chloride 0.9 % 100 mL IVPB       See Hyperspace for full Linked Orders Report.   100 mg 200 mL/hr over 30 Minutes Intravenous Daily 11/21/20 0957 11/26/20 0959   11/21/20 1100  remdesivir 200 mg in sodium chloride 0.9% 250 mL IVPB       See Hyperspace for full Linked Orders Report.   200 mg 580 mL/hr over 30 Minutes Intravenous Once 11/21/20 0957 11/21/20 1154   11/21/20 0800  cefTRIAXone (ROCEPHIN) 2 g in sodium chloride 0.9 % 100 mL IVPB  Status:  Discontinued        2 g 200 mL/hr over 30 Minutes Intravenous Every 24 hours 11/21/20 0747 11/21/20 1027   11/21/20 0800  azithromycin (ZITHROMAX) 500 mg in sodium chloride 0.9 % 250 mL IVPB  Status:  Discontinued        500 mg 250 mL/hr over 60 Minutes Intravenous Every 24 hours 11/21/20 0747 11/21/20 1027       PRN meds: acetaminophen, albuterol, dextromethorphan-guaiFENesin, hydrALAZINE, ondansetron (ZOFRAN) IV   Objective: Vitals:   11/21/20 2226 11/22/20 0500  BP: (!) 159/81 (!) 147/74  Pulse: 85 78  Resp: 20 20  Temp: 100.1 F (37.8 C) 100.2 F (37.9 C)  SpO2: 96% 96%    Intake/Output Summary (Last 24 hours) at 11/22/2020 1332 Last data filed at 11/22/2020 0500 Gross per 24 hour  Intake --  Output 1900 ml  Net -1900 ml   Filed Weights   11/21/20 0804  Weight: 121.7 kg   Weight change:  Body mass index is 37.43 kg/m.   Physical Exam: General exam: Pleasant, middle-aged African-American female.  Not in distress Skin: No rashes, lesions or ulcers. HEENT: Atraumatic, normocephalic, no obvious bleeding Lungs: Clear to auscultation bilaterally CVS: Regular rate and rhythm, no murmur GI/Abd soft, nontender, nondistended, bowel sound present CNS: Alert, awake, oriented x3, at baseline, she has deficits in the lower extremities because of multiple sclerosis Psychiatry: Mood appropriate Extremities: No pedal edema, no calf tenderness  Data Review: I have personally reviewed  the laboratory data and studies available.  Recent Labs  Lab 11/21/20 0756 11/22/20 0437  WBC 6.7 3.8*  NEUTROABS 5.6 2.9  HGB 13.5 12.5  HCT 38.1 34.5*  MCV 77.1* 75.7*  PLT 224 196   Recent Labs  Lab 11/21/20 0756 11/22/20 0437  NA 140 138  K 3.8 3.6  CL 107 109  CO2 24 24  GLUCOSE 99 103*  BUN 20 14  CREATININE 0.64 0.65  CALCIUM 9.0 8.6*    F/u labs ordered  Unresulted Labs (From admission, onward)     Start     Ordered   11/22/20 0500  Comprehensive metabolic panel  Daily,   STAT      11/21/20 1234   11/22/20 0500  CBC with Differential/Platelet  Daily,   STAT      11/21/20 1234   11/22/20 0500  C-reactive protein  Daily,   STAT      11/21/20 1234   11/21/20 1234  Expectorated Sputum Assessment w Gram Stain, Rflx to Resp Cult  Once,   STAT        11/21/20 1234            Signed, Lorin Glass, MD [facility] 11/22/2020

## 2020-11-22 NOTE — Evaluation (Signed)
Physical Therapy Evaluation Patient Details Name: Lisa Williams Stolarz MRN: [id number] DOB: 07/17/1960 Today's Date: 11/22/2020   History of Present Illness  Pt is a 60 y/o F admitted on 11/21/20 with c/c of fever, chills, cough & weakness. Pt found to be COVID (+). PMH: MS, HTN, HLD, tobacco abuse, chronic galactorrhea, neurogenic bladder, SVT, chronic back pain  Clinical Impression  Pt seen for PT evaluation with co-tx with OT for pt & therapists' safety. Pt is very motivated to participate & eager to return to PLOF. Pt requires max assist for bed mobility as no active movement noted in BLE & pt demonstrates increased tone. Pt initially requires max assist for static sitting balance but fade to close supervision, requires min/mod assist for dynamic sitting balance. Pt attempts sit<>stand multiple times from EOB with +2 assist but unable to achieve full upright standing 2/2 BLE weakness, no anterior weight shift, & pt c/o inability to "feel legs". Pt is able to laterally scoot to L on EOB with close supervision<>CGA. Pt is eager to return to PLOF & is a great CIR candidate as she would benefit from intensive therapies prior to return home.     Follow Up Recommendations CIR    Equipment Recommendations  Other (comment) (TBD in next venue)    Recommendations for Other Services       Precautions / Restrictions Precautions Precautions: Fall Precaution Comments: personal BLE AFOs Restrictions Weight Bearing Restrictions: No      Mobility  Bed Mobility Overal bed mobility: Needs Assistance Bed Mobility: Supine to Sit;Sit to Supine     Supine to sit: Max assist;HOB elevated Sit to supine: Max assist;HOB elevated   General bed mobility comments: bed rails, unable to move BLE on/off bed    Transfers Overall transfer level: Needs assistance Equipment used: Rolling walker (2 wheeled) Transfers: Sit to/from Stand Sit to Stand: Total assist;+2 physical assistance;From elevated  surface;+2 safety/equipment         General transfer comment: attempts sit>stand from elevated EOB with RW with +2 assist with PT & OT blocking knees to prevent buckling but pt leaning posteriorly & unable to shift weight anteriorly, unable to successfully bear weight through BLE due to pt reporting inability to feel feet on the floor  Ambulation/Gait                Stairs            Wheelchair Mobility    Modified Rankin (Stroke Patients Only)       Balance Overall balance assessment: Needs assistance Sitting-balance support: Feet supported;Bilateral upper extremity supported Sitting balance-Leahy Scale: Poor Sitting balance - Comments: max fade to close supervision for static sitting balance 2/2 initial posterior lean, does require min/mod assist for dynamic sitting balance   Standing balance support: Bilateral upper extremity supported;During functional activity Standing balance-Leahy Scale: Zero                               Pertinent Vitals/Pain Pain Assessment: No/denies pain    Home Living Family/patient expects to be discharged to:: Private residence Living Arrangements: Parent;Other relatives (sister & mother) Available Help at Discharge: Family;Available 24 hours/day Type of Home: House Home Access: Stairs to enter Entrance Stairs-Rails: Doctor, general practice of Steps: 1 (pt walks to door & sits in w/c to bypass negotiating step) Home Layout: One level Home Equipment: Walker - 2 wheels;Grab bars - tub/shower;Grab bars - toilet;Wheelchair - Engineer, technical sales - power;Shower  seat - built in;Hand held shower head;Hospital bed      Prior Function Level of Independence: Independent with assistive device(s)         Comments: Mod I with RW, working with OPPT (was able to walk 100 ft with RW & BLE AFOs without assist), just returned from vacation in [place]     Hand Dominance        Extremity/Trunk Assessment   Upper Extremity  Assessment Upper Extremity Assessment: Generalized weakness    Lower Extremity Assessment Lower Extremity Assessment:  (BLE sensation intact to light touch, proprioception impaired as pt reports she cannot feel her BLE touching the floor, unable to actively move BLE in bed, increased tone)       Communication   Communication: No difficulties  Cognition Arousal/Alertness: Awake/alert Behavior During Therapy: WFL for tasks assessed/performed Overall Cognitive Status: Within Functional Limits for tasks assessed                                 General Comments: Very pleasant & motivated lady, tearful at times re: CLOF as she was making good progress with mobility prior to admission.      General Comments      Exercises     Assessment/Plan    PT Assessment Patient needs continued PT services  PT Problem List Decreased strength;Decreased mobility;Decreased activity tolerance;Impaired sensation;Decreased knowledge of use of DME;Decreased balance       PT Treatment Interventions DME instruction;Therapeutic activities;Modalities;Gait training;Therapeutic exercise;Patient/family education;Stair training;Balance training;Wheelchair mobility training;Functional mobility training;Neuromuscular re-education;Manual techniques    PT Goals (Current goals can be found in the Care Plan section)  Acute Rehab PT Goals Patient Stated Goal: return to PLOF PT Goal Formulation: With patient Time For Goal Achievement: 12/06/20 Potential to Achieve Goals: Good    Frequency 7X/week   Barriers to discharge        Co-evaluation PT/OT/SLP Co-Evaluation/Treatment: Yes Reason for Co-Treatment: For patient/therapist safety;Complexity of the patient's impairments (multi-system involvement);To address functional/ADL transfers PT goals addressed during session: Mobility/safety with mobility;Balance;Proper use of DME         AM-PAC PT "6 Clicks" Mobility  Outcome Measure Help needed  turning from your back to your side while in a flat bed without using bedrails?: Total Help needed moving from lying on your back to sitting on the side of a flat bed without using bedrails?: Total Help needed moving to and from a bed to a chair (including a wheelchair)?: Total Help needed standing up from a chair using your arms (e.g., wheelchair or bedside chair)?: Total Help needed to walk in hospital room?: Total Help needed climbing 3-5 steps with a railing? : Total 6 Click Score: 6    End of Session Equipment Utilized During Treatment: Gait belt (personal BLE AFOs) Activity Tolerance: Patient tolerated treatment well Patient left: in bed;with bed alarm set;with call bell/phone within reach;with nursing/sitter in room Nurse Communication: Mobility status PT Visit Diagnosis: Unsteadiness on feet (R26.81);Difficulty in walking, not elsewhere classified (R26.2);Muscle weakness (generalized) (M62.81)    Time: 1025-8527 PT Time Calculation (min) (ACUTE ONLY): 31 min   Charges:   PT Evaluation $PT Eval High Complexity: 1 High PT Treatments $Therapeutic Activity: 8-22 mins        Aleda Grana, PT, DPT 11/22/20, 10:29 AM   Sandi Mariscal 11/22/2020, 10:26 AM

## 2020-11-22 NOTE — Progress Notes (Signed)
Inpatient Rehab Admissions Coordinator Note:   Per PT recommendations, pt was screened for CIR candidacy by Estill Dooms, PT, DPT.  At this time we are recommending a CIR consult and I will place an order per our protocol.  Please contact me with questions.   Estill Dooms, PT, DPT [phone] 11/22/20 11:11 AM

## 2020-11-22 NOTE — Evaluation (Addendum)
Occupational Therapy Evaluation Patient Details Name: Lisa Williams MRN: [id number] DOB: 02-26-1961 Today's Date: 11/22/2020    History of Present Illness Pt is a 60 y/o F admitted on 11/21/20 with c/c of fever, chills, cough & weakness. Pt found to be COVID (+). PMH: Lisa, HTN, HLD, tobacco abuse, chronic galactorrhea, neurogenic bladder, SVT, chronic back pain   Clinical Impression   Lisa Williams was seen for OT evaluation this date. Prior to hospital admission, pt was receiving outpatient PT and ambulating limited distances c RW, MOD I for ADLs. 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 Pt presents to acute OT demonstrating impaired ADL performance and functional mobility 2/2 decreased activity tolerance and functional strength/ROM/balance deficits.   Pt currently requires initial MAX A improving to SBA sitting EOB for tooth brushing sitting EOB. MAX A don B AFOs and MOD A don B shoes seated EOB - assist to bring BLE into figure 4 position for LB access. MOD A for rolling at bed leve for pericare access. Pt would benefit from skilled OT to address noted impairments and functional limitations (see below for any additional details) in order to maximize safety and independence while minimizing falls risk and caregiver burden. Upon hospital discharge, recommend CIR to maximize pt safety and return to functional independence during meaningful occupations of daily life.     Follow Up Recommendations  CIR    Equipment Recommendations  Other (comment) (TBD at next venue of care)    Recommendations for Other Services       Precautions / Restrictions Precautions Precautions: Fall Precaution Comments: personal BLE AFOs Restrictions Weight Bearing Restrictions: No      Mobility Bed Mobility Overal bed mobility: Needs Assistance Bed Mobility: Rolling;Supine to Sit;Sit to Supine Rolling: Mod assist   Supine to sit: Max assist;HOB  elevated Sit to supine: Max assist;HOB elevated   General bed mobility comments: bed rails, unable to move BLE on/off bed    Transfers Overall transfer level: Needs assistance Equipment used: Rolling walker (2 wheeled) Transfers: Sit to/from Stand;Lateral/Scoot Transfers Sit to Stand: Total assist;+2 physical assistance;From elevated surface;+2 safety/equipment        Lateral/Scoot Transfers: Mod assist General transfer comment: B knee blocking for scooting along EOB    Balance Overall balance assessment: Needs assistance Sitting-balance support: Feet supported;Bilateral upper extremity supported Sitting balance-Leahy Scale: Poor Sitting balance - Comments: initial MAX A improving to SBA sitting EOB   Standing balance support: Bilateral upper extremity supported;During functional activity Standing balance-Leahy Scale: Zero                             ADL either performed or assessed with clinical judgement   ADL Overall ADL's : Needs assistance/impaired                                       General ADL Comments: MAX A don B AFOs and MOD A don B shoes seated EOB - assist to bring BLE into figure 4 position for LB access. MOD A for rolling at bed leve for pericare access. initial MAX A improving to SBA sitting EOB for tooth brushing sitting EOB.       Pertinent Vitals/Pain Pain Assessment: No/denies pain     Hand Dominance     Extremity/Trunk Assessment Upper Extremity Assessment  Upper Extremity Assessment: Generalized weakness   Lower Extremity Assessment Lower Extremity Assessment: Defer to PT evaluation       Communication Communication Communication: No difficulties   Cognition Arousal/Alertness: Awake/alert Behavior During Therapy: WFL for tasks assessed/performed Overall Cognitive Status: Within Functional Limits for tasks assessed                                 General Comments: Very pleasant & motivated lady,  tearful at times re: CLOF as she was making good progress with mobility prior to admission.   General Comments       Exercises Exercises: Other exercises Other Exercises Other Exercises: Pt educated re: OT role, DME recs, d/c recs, falls prevention, HEP Other Exercises: LBD, toileting, sup<>sit, sit<>stand, sitting/standing balance/tolerance, lateral scoot, rolling   Shoulder Instructions      Home Living Family/patient expects to be discharged to:: Private residence Living Arrangements: Parent;Other relatives (sister and mother) Available Help at Discharge: Family;Available 24 hours/day Type of Home: House Home Access: Ramped entrance Entrance Stairs-Number of Steps: 1 (pt walks to door & sits in w/c to bypass negotiating step) Entrance Stairs-Rails: Right;Left Home Layout: Two level; able to live on main                Home Equipment: Walker - 2 wheels;Grab bars - tub/shower;Grab bars - toilet;Wheelchair - Engineer, technical sales - power;Shower seat - built in;Hand held shower head;Hospital bed          Prior Functioning/Environment Level of Independence: Independent with assistive device(s)        Comments: Mod I with RW, working with OPPT (was able to walk 100 ft with RW & BLE AFOs without assist), just returned from vacation in [place]        OT Problem List: Decreased strength;Decreased range of motion;Decreased activity tolerance;Impaired balance (sitting and/or standing)      OT Treatment/Interventions: Self-care/ADL training;Therapeutic exercise;DME and/or AE instruction;Energy conservation;Therapeutic activities;Patient/family education;Balance training    OT Goals(Current goals can be found in the care plan section) Acute Rehab OT Goals Patient Stated Goal: return to PLOF OT Goal Formulation: With patient Time For Goal Achievement: 12/06/20 Potential to Achieve Goals: Good ADL Goals Pt Will Perform Grooming: with modified independence;sitting Pt Will Perform  Lower Body Dressing: with set-up;with supervision;sitting/lateral leans Pt Will Transfer to Toilet: with max assist;squat pivot transfer;bedside commode (c LRAD PRN)  OT Frequency: Min 3X/week           Co-evaluation PT/OT/SLP Co-Evaluation/Treatment: Yes Reason for Co-Treatment: Complexity of the patient's impairments (multi-system involvement);To address functional/ADL transfers;For patient/therapist safety PT goals addressed during session: Mobility/safety with mobility OT goals addressed during session: ADL's and self-care      AM-PAC OT "6 Clicks" Daily Activity     Outcome Measure Help from another person eating meals?: A Little Help from another person taking care of personal grooming?: A Little Help from another person toileting, which includes using toliet, bedpan, or urinal?: A Lot Help from another person bathing (including washing, rinsing, drying)?: A Lot Help from another person to put on and taking off regular upper body clothing?: A Little Help from another person to put on and taking off regular lower body clothing?: A Lot 6 Click Score: 15   End of Session Equipment Utilized During Treatment: Rolling walker;Gait belt  Activity Tolerance: Patient tolerated treatment well Patient left: in bed;with call bell/phone within reach;with bed alarm set;with nursing/sitter in room  OT  Visit Diagnosis: Other abnormalities of gait and mobility (R26.89);Muscle weakness (generalized) (M62.81)                Time: 3818-2993 OT Time Calculation (min): 31 min Charges:  OT General Charges $OT Visit: 1 Visit OT Evaluation $OT Eval Low Complexity: 1 Low OT Treatments $Self Care/Home Management : 8-22 mins  Kathie Dike, M.S. OTR/L  11/22/20, 1:11 PM  ascom [phone]

## 2020-11-23 ENCOUNTER — Ambulatory Visit: Payer: PPO

## 2020-11-23 LAB — CBC WITH DIFFERENTIAL/PLATELET
Abs Immature Granulocytes: 0.01 10*3/uL (ref 0.00–0.07)
Basophils Absolute: 0 10*3/uL (ref 0.0–0.1)
Basophils Relative: 0 %
Eosinophils Absolute: 0 10*3/uL (ref 0.0–0.5)
Eosinophils Relative: 1 %
HCT: 36.3 % (ref 36.0–46.0)
Hemoglobin: 12.9 g/dL (ref 12.0–15.0)
Immature Granulocytes: 0 %
Lymphocytes Relative: 14 %
Lymphs Abs: 0.4 10*3/uL — ABNORMAL LOW (ref 0.7–4.0)
MCH: 27.2 pg (ref 26.0–34.0)
MCHC: 35.5 g/dL (ref 30.0–36.0)
MCV: 76.4 fL — ABNORMAL LOW (ref 80.0–100.0)
Monocytes Absolute: 0.8 10*3/uL (ref 0.1–1.0)
Monocytes Relative: 28 %
Neutro Abs: 1.5 10*3/uL — ABNORMAL LOW (ref 1.7–7.7)
Neutrophils Relative %: 57 %
Platelets: 197 10*3/uL (ref 150–400)
RBC: 4.75 MIL/uL (ref 3.87–5.11)
RDW: 13.9 % (ref 11.5–15.5)
WBC: 2.7 10*3/uL — ABNORMAL LOW (ref 4.0–10.5)
nRBC: 0 % (ref 0.0–0.2)

## 2020-11-23 LAB — COMPREHENSIVE METABOLIC PANEL
ALT: 127 U/L — ABNORMAL HIGH (ref 0–44)
AST: 101 U/L — ABNORMAL HIGH (ref 15–41)
Albumin: 3.2 g/dL — ABNORMAL LOW (ref 3.5–5.0)
Alkaline Phosphatase: 88 U/L (ref 38–126)
Anion gap: 9 (ref 5–15)
BUN: 15 mg/dL (ref 6–20)
CO2: 25 mmol/L (ref 22–32)
Calcium: 8.6 mg/dL — ABNORMAL LOW (ref 8.9–10.3)
Chloride: 106 mmol/L (ref 98–111)
Creatinine, Ser: 0.56 mg/dL (ref 0.44–1.00)
GFR, Estimated: >60 mL/min (ref >60)
Glucose, Bld: 92 mg/dL (ref 70–99)
Potassium: 3.6 mmol/L (ref 3.5–5.1)
Sodium: 140 mmol/L (ref 135–145)
Total Bilirubin: 0.6 mg/dL (ref 0.3–1.2)
Total Protein: 6 g/dL — ABNORMAL LOW (ref 6.5–8.1)

## 2020-11-23 LAB — C-REACTIVE PROTEIN: CRP: 0.9 mg/dL (ref <1.0)

## 2020-11-23 MED ORDER — PANTOPRAZOLE SODIUM 40 MG IV SOLR
40.0000 mg | Freq: Two times a day (BID) | INTRAVENOUS | Status: DC
Start: 2020-11-23 — End: 2020-11-27
  Administered 2020-11-23 – 2020-11-26 (×8): 40 mg via INTRAVENOUS
  Filled 2020-11-23 (×7): qty 40

## 2020-11-23 MED ORDER — GUAIFENESIN-DM 100-10 MG/5ML PO SYRP
5.0000 mL | ORAL_SOLUTION | Freq: Four times a day (QID) | ORAL | Status: DC | PRN
  Filled 2020-11-23: qty 5

## 2020-11-23 MED ORDER — SODIUM CHLORIDE 0.9 % IV SOLN
1000.0000 mg | Freq: Every day | INTRAVENOUS | Status: AC
  Administered 2020-11-23 – 2020-11-25 (×3): 1000 mg via INTRAVENOUS
  Filled 2020-11-23 (×3): qty 8

## 2020-11-23 NOTE — TOC Initial Note (Signed)
Transition of Care [facility]) - Initial/Assessment Note    Patient Details  Name: Lisa Williams MRN: [id number] Date of Birth: 10/10/1960  Transition of Care [facility]) CM/SW Contact:    Margarito Liner, LCSW Phone Number: 11/23/2020, 9:42 AM  Clinical Narrative:  Patient on COVID isolation precautions. CSW called patient in the room, introduced role, and explained that therapy recommendations would be discussed. Discussed potential barriers to CIR placement. She is agreeable to SNF placement in the event CIR is unable to take her. She went to CIR in March 2021 and said it was a good experience. Positive COVID test on 7/17 so it would be 7/27 before any SNF would be able to accept her. Will send referral later this week, closer to 10-day mark. No further concerns. CSW encouraged patient to contact CSW as needed. CSW will continue to follow patient for support and facilitate discharge to SNF, if needed, once medically stable.                Expected Discharge Plan: IP Rehab Facility Barriers to Discharge: Continued Medical Work up   Patient Goals and CMS Choice        Expected Discharge Plan and Services Expected Discharge Plan: IP Rehab Facility     Post Acute Care Choice: IP Rehab Living arrangements for the past 2 months: Single Family Home                                      Prior Living Arrangements/Services Living arrangements for the past 2 months: Single Family Home Lives with:: Parents, Siblings Patient language and need for interpreter reviewed:: Yes Do you feel safe going back to the place where you live?: Yes      Need for Family Participation in Patient Care: Yes (Comment) Care giver support system in place?: Yes (comment)   Criminal Activity/Legal Involvement Pertinent to Current Situation/Hospitalization: No - Comment as needed  Activities of Daily Living Home Assistive Devices/Equipment: Eyeglasses, Dan Humphreys (specify type) ADL Screening (condition at  time of admission) Patient's cognitive ability adequate to safely complete daily activities?: Yes Is the patient deaf or have difficulty hearing?: No Does the patient have difficulty seeing, even when wearing glasses/contacts?: No Does the patient have difficulty concentrating, remembering, or making decisions?: No Patient able to express need for assistance with ADLs?: Yes Does the patient have difficulty dressing or bathing?: No Independently performs ADLs?: Yes (appropriate for developmental age) Does the patient have difficulty walking or climbing stairs?: Yes Weakness of Legs: Both Weakness of Arms/Hands: None  Permission Sought/Granted Permission sought to share information with : Facility Industrial/product designer granted to share information with : Yes, Verbal Permission Granted     Permission granted to share info w AGENCY: SNF's        Emotional Assessment Appearance:: Appears stated age Attitude/Demeanor/Rapport: Engaged, Gracious Affect (typically observed): Accepting, Appropriate, Calm, Pleasant Orientation: : Oriented to Self, Oriented to Place, Oriented to  Time, Oriented to Situation Alcohol / Substance Use: Not Applicable Psych Involvement: No (comment)  Admission diagnosis:  Weakness [R53.1] COVID [U07.1] Acute respiratory disease due to COVID-19 virus [U07.1, J06.9] Patient Active Problem List   Diagnosis Date Noted   Acute respiratory disease due to COVID-19 virus 11/21/2020   Weakness    Hypoalbuminemia due to protein-calorie malnutrition (HCC)    Neurogenic bowel    Neurogenic bladder    Labile blood pressure  Neuropathic pain    Abscess of female pelvis    SVT (supraventricular tachycardia) (HCC)    Radial styloid tenosynovitis 03/12/2018   Wheelchair confinement 02/27/2018   Localized osteoporosis with current pathological fracture with routine healing 01/19/2017   Wrist fracture 01/16/2017   Sprain of ankle 03/23/2016   Closed fracture  of lateral malleolus 03/16/2016   Health care maintenance 01/24/2016   Blood pressure elevated without history of HTN 10/25/2015   Essential hypertension 10/25/2015   Multiple sclerosis (HCC) 10/02/2015   Chronic left shoulder pain 07/19/2015   Multiple sclerosis exacerbation (HCC) 07/14/2015   MS (multiple sclerosis) (HCC) 11/26/2014   Increased body mass index 11/26/2014   HPV test positive 11/26/2014   Status post laparoscopic supracervical hysterectomy 11/26/2014   Galactorrhea 11/26/2014   Back ache 05/21/2014   Adiposity 05/21/2014   Disordered sleep 05/21/2014   Muscle spasticity 05/21/2014   Spasticity 05/21/2014   Calculus of kidney 12/09/2013   Renal colic 12/09/2013   Hypercholesteremia 08/19/2013   Hereditary and idiopathic neuropathy 08/19/2013   Hypercholesterolemia without hypertriglyceridemia 08/19/2013   Bladder infection, chronic 07/25/2012   Disorder of bladder function 07/25/2012   Incomplete bladder emptying 07/25/2012   Microscopic hematuria 07/25/2012   Right upper quadrant pain 07/25/2012   PCP:  Lauro Regulus, MD Pharmacy:   [id number] [place], [place] - [address] AT [facility] [address] [place] [address] Phone: [phone] Fax: [phone]     Social Determinants of Health (SDOH) Interventions    Readmission Risk Interventions No flowsheet data found.

## 2020-11-23 NOTE — Progress Notes (Signed)
Physical Therapy Treatment Patient Details Name: Lisa Williams MRN: [id number] DOB: [birth year] Today's Date: 11/23/2020    History of Present Illness Pt is a 60 y/o F admitted on 11/21/20 with c/c of fever, chills, cough & weakness. Pt found to be COVID (+). PMH: MS, HTN, HLD, tobacco abuse, chronic galactorrhea, neurogenic bladder, SVT, chronic back pain    PT Comments    Pt was long sitting in bed upon arriving. She agrees to session and is extremely motivated. " What are we going to do today?"  PT/OT co-treat after pt had exited R side of bed with +1 assist and stood 1 x with max assist of one. Once OT arrived, pt stood 3-4 more times with +2 assist. Was able to tolerate standing ~ 20 sec with vcs for breathing and keeping knees locked in extension. Pt was limited by fatigue. After several STS EOB she was able to lateral scoot to recliner from EOB. Author returned ~ 5 hours later and assisted Hydrologist with [employer] transfer back to bed. Overall pt is progressing and extremely motivated. MD messaged for order for [facility] boots. Will apply next session. Acute PT highly recommend DC to CIR to assist pt with returning to PLOF.    Follow Up Recommendations  CIR     Equipment Recommendations  Other (comment) (defer to next level of care)       Precautions / Restrictions Precautions Precautions: Fall Precaution Comments: personal BLE AFOs Restrictions Weight Bearing Restrictions: No    Mobility  Bed Mobility Overal bed mobility: Needs Assistance Bed Mobility: Rolling;Supine to Sit;Sit to Supine Rolling: Mod assist   Supine to sit: Max assist;HOB elevated Sit to supine: Max assist;HOB elevated   General bed mobility comments: pt has great UE strength however poor lower body strength/coordination    Transfers Overall transfer level: Needs assistance Equipment used: Rolling walker (2 wheeled) Transfers: Sit to/from Stand Sit to Stand: Max assist;From elevated surface;Mod  assist;+2 physical assistance;+2 safety/equipment        Lateral/Scoot Transfers: Mod assist General transfer comment: Pt stood 1 x with max assist of one until 2nd person assist arrived. she stood several more times with +2 mod assist prior to lateral transfer with mod assist. required hoyer to return to bed from recliner after sitting up ion recliner x ~ 5 hours  Ambulation/Gait  General Gait Details: unable/unsafe at this time      Balance Overall balance assessment: Needs assistance Sitting-balance support: Feet supported;Bilateral upper extremity supported Sitting balance-Leahy Scale: Fair Sitting balance - Comments: with all extremeity support, was able to sit EOB with close supervision   Standing balance support: Bilateral upper extremity supported;During functional activity Standing balance-Leahy Scale: Poor Standing balance comment: requires B knee block    Cognition Arousal/Alertness: Awake/alert Behavior During Therapy: WFL for tasks assessed/performed Overall Cognitive Status: Within Functional Limits for tasks assessed        General Comments: Pt is A and O x 4 and extremely motivated      Exercises Other Exercises Other Exercises: Pt educated re:  falls prevention, HEP Other Exercises: LBD, sit<>stand x3, sitting/standing balance/tolerance, lateral scoot        Pertinent Vitals/Pain Pain Assessment: No/denies pain     PT Goals (current goals can now be found in the care plan section) Acute Rehab PT Goals Patient Stated Goal: return to PLOF Progress towards PT goals: Progressing toward goals    Frequency    7X/week      PT Plan Current  plan remains appropriate    Co-evaluation     PT goals addressed during session: Mobility/safety with mobility;Balance;Proper use of DME;Strengthening/ROM        AM-PAC PT "6 Clicks" Mobility   Outcome Measure  Help needed turning from your back to your side while in a flat bed without using bedrails?: A  Lot Help needed moving from lying on your back to sitting on the side of a flat bed without using bedrails?: A Lot Help needed moving to and from a bed to a chair (including a wheelchair)?: Total Help needed standing up from a chair using your arms (e.g., wheelchair or bedside chair)?: Total Help needed to walk in hospital room?: Total Help needed climbing 3-5 steps with a railing? : Total 6 Click Score: 8    End of Session Equipment Utilized During Treatment: Gait belt Activity Tolerance: Patient tolerated treatment well Patient left: in chair;with call bell/phone within reach;with chair alarm set;with nursing/sitter in room Nurse Communication: Mobility status Paramedic with rweturning pt bck to bed after sitting in chair several hours. recommend hoyer for OOB with RN staff) PT Visit Diagnosis: Unsteadiness on feet (R26.81);Difficulty in walking, not elsewhere classified (R26.2);Muscle weakness (generalized) (M62.81)     Time: 1127-1200 PT Time Calculation (min) (ACUTE ONLY): 33 min  Charges:  $Therapeutic Activity: 8-22 mins                     Jetta Lout PTA 11/23/20, 3:51 PM

## 2020-11-23 NOTE — Progress Notes (Signed)
Occupational Therapy Treatment Patient Details Name: Lisa Williams MRN: [id number] DOB: 06-Feb-1961 Today's Date: 11/23/2020    History of present illness Pt is a 60 y/o F admitted on 11/21/20 with c/c of fever, chills, cough & weakness. Pt found to be COVID (+). PMH: Lisa, HTN, HLD, tobacco abuse, chronic galactorrhea, neurogenic bladder, SVT, chronic back pain   OT comments  Lisa Williams was seen for OT treatment on this date, seen with PT for safe mobility. Upon arrival to room pt seated EOB with PT. Pt requires MOD A x2 + RW + B knee block sit<>stand x3 trials - tolerates <1 min standing each trial. MAX A for lateral scoot t/f bed>chair. MAX A doff B shoes long sitting in recliner. Left with all needs in reach. Pt making good progress toward goals. Pt continues to benefit from skilled OT services to maximize return to PLOF and minimize risk of future falls, injury, caregiver burden, and readmission. Will continue to follow POC. Discharge recommendation remains appropriate.    Follow Up Recommendations  CIR    Equipment Recommendations  Other (comment) (TBD at next venue of care)    Recommendations for Other Services      Precautions / Restrictions Precautions Precautions: Fall Precaution Comments: personal BLE AFOs Restrictions Weight Bearing Restrictions: No       Mobility Bed Mobility               General bed mobility comments: pt received EOB and left in chair    Transfers Overall transfer level: Needs assistance Equipment used: Rolling walker (2 wheeled) Transfers: Sit to/from Stand;Lateral/Scoot Transfers Sit to Stand: Mod assist;+2 physical assistance;From elevated surface        Lateral/Scoot Transfers: Mod assist      Balance Overall balance assessment: Needs assistance Sitting-balance support: Feet supported;Bilateral upper extremity supported Sitting balance-Leahy Scale: Fair Sitting balance - Comments: posterior lean with dynamic sitting,  uses BUE to correct   Standing balance support: Bilateral upper extremity supported;During functional activity Standing balance-Leahy Scale: Poor Standing balance comment: requires B knee block                           ADL either performed or assessed with clinical judgement   ADL Overall ADL's : Needs assistance/impaired                                       General ADL Comments: MAX A doff B shoes lomg sitting in recliner. MOD A x2 for sit<>stand at EOB and MAX A for lateral scoot t/f.      Cognition Arousal/Alertness: Awake/alert Behavior During Therapy: WFL for tasks assessed/performed Overall Cognitive Status: Within Functional Limits for tasks assessed                                          Exercises Exercises: Other exercises Other Exercises Other Exercises: Pt educated re:  falls prevention, HEP Other Exercises: LBD, sit<>stand x3, sitting/standing balance/tolerance, lateral scoot           Pertinent Vitals/ Pain       Pain Assessment: No/denies pain         Frequency  Min 3X/week        Progress Toward Goals  OT Goals(current goals can now  be found in the care plan section)  Progress towards OT goals: Progressing toward goals  Acute Rehab OT Goals Patient Stated Goal: return to PLOF OT Goal Formulation: With patient Time For Goal Achievement: 12/06/20 Potential to Achieve Goals: Good ADL Goals Pt Will Perform Grooming: with modified independence;sitting Pt Will Perform Lower Body Dressing: with set-up;with supervision;sitting/lateral leans Pt Will Transfer to Toilet: with max assist;squat pivot transfer;bedside commode  Plan Discharge plan remains appropriate;Frequency remains appropriate       AM-PAC OT "6 Clicks" Daily Activity     Outcome Measure   Help from another person eating meals?: A Little Help from another person taking care of personal grooming?: A Little Help from another person  toileting, which includes using toliet, bedpan, or urinal?: A Lot Help from another person bathing (including washing, rinsing, drying)?: A Lot Help from another person to put on and taking off regular upper body clothing?: A Little Help from another person to put on and taking off regular lower body clothing?: A Lot 6 Click Score: 15    End of Session Equipment Utilized During Treatment: Rolling walker;Gait belt  OT Visit Diagnosis: Other abnormalities of gait and mobility (R26.89);Muscle weakness (generalized) (M62.81)   Activity Tolerance Patient tolerated treatment well   Patient Left in chair;with call bell/phone within reach   Nurse Communication          Time: 7124-5809 OT Time Calculation (min): 18 min  Charges: OT General Charges $OT Visit: 1 Visit OT Treatments $Self Care/Home Management : 8-22 mins  Kathie Dike, M.S. OTR/L  11/23/20, 1:49 PM  ascom [phone]

## 2020-11-23 NOTE — Progress Notes (Signed)
PROGRESS NOTE  Lisa Williams  DOB: 1960-06-11  PCP: Lauro Regulus, MD VZC:[id number]  DOA: 11/21/2020  LOS: 2 days  Hospital Day: 3   Chief Complaint  Patient presents with   Weakness    Brief narrative: Lisa Williams is a 60 y.o. female with PMH significant for multiple sclerosis, HTN, HLD, SVT, tobacco abuse, chronic galactorrhea, neurogenic bladder. Patient presented to the ED on 7/17 from home with complaint of fever up to 100.2, chills, cough mostly dry, intermittent chest pain and generalized weakness.   In the ED, patient was afebrile, 89% on room air Labs with white count 6.7 COVID PCR positive Chest x-ray negative for infiltration Admitted to hospitalist service for COVID infection. Neurology was also consulted because of suspicion of worsening multiple sclerosis deficits  Subjective: Patient was seen and examined this morning.   Sitting up in chair.  Not in distress.  Cough persists.  Still significantly weak per PT eval note. CIR will not be able to take her for at least 10 days.  Assessment/Plan: COVID infection -Presented with fever, chest pain, dry cough -COVID test: PCR positive in the ED -Chest imaging: No infiltrates -Treatment: Currently on a 5-day course of IV remdesivir, bronchodilators, Mucinex. -Progression: Not on supplemental oxygen -Procalcitonin level negative.  Not on antibiotics  -Supportive care: Vitamin C, Zinc, PRN inhalers, Tylenol, Antitussives (benzonatate/ Mucinex/Tussionex).   -Encouraged incentive spirometry, prone position, out of bed and early mobilization as much as possible -WBC and inflammatory markers trend as below.  Recent Labs  Lab 11/21/20 0746 11/21/20 0756 11/21/20 0925 11/21/20 1259 11/22/20 0437 11/23/20 0511  SARSCOV2NAA POSITIVE*  --   --   --   --   --   WBC  --  6.7  --   --  3.8* 2.7*  LATICACIDVEN 1.2  --   --   --   --   --   PROCALCITON  --   --  <0.10  --   --   --   DDIMER   --   --   --  0.50  --   --   CRP  --   --   --  0.6 0.9 0.9  ALT  --  122*  --   --  104* 127*    Generalized weakness in the setting of multiple sclerosis  -Generalized weakness is likely because of MS exacerbation due to infection.   -Neurology consulted.  Recommend MRI brain and C-spine.  However patient has claustrophobia and is refusing to get MRI.  She hopes to get stronger with treatment of COVID.  -continue home Siponimod Fumarate (ok to let pt take her own meds) -continue home baclofen, Neurontin, tizanidine -Patient is significantly weaker than her baseline.  Patient reports that in the past when she used to have weakness like this, she used to receive high-dose IV Solu-Medrol for 3 days.  She last received it in April 2021. -I will start her on high-dose Solu-Medrol at this time.   Essential hypertension -Continue to monitor blood pressure on irbesartan -IV hydralazine as needed   Neurogenic bladder -Oxybutynin   Mobility: Encourage ambulation with PT Code Status:   Code Status: Full Code  Nutritional status: Body mass index is 37.43 kg/m.     Diet:  Diet Order             Diet Heart Room service appropriate? Yes; Fluid consistency: Thin  Diet effective now  DVT prophylaxis: Lovenox subcu    Antimicrobials: Remdesivir, not on antibiotics Fluid: None Consultants: None Family Communication: None at bedside  Status is: Inpatient  Remains inpatient appropriate because: Needs IV treatment for COVID  Dispo: The patient is from: Home              Anticipated d/c is to: CIR recommended by PT              Patient currently is not medically stable to d/c.   Difficult to place patient No     Infusions:   remdesivir 100 mg in NS 100 mL 100 mg (11/23/20 1001)    Scheduled Meds:  baclofen  20 mg Oral TID   benzonatate  200 mg Oral TID   cholecalciferol  2,000 Units Oral Daily   enoxaparin (LOVENOX) injection  0.5 mg/kg Subcutaneous Q24H    gabapentin  100 mg Oral BID   irbesartan  150 mg Oral Daily   oxybutynin  10 mg Oral Daily   Siponimod Fumarate  2 mg Oral Daily   tiZANidine  2 mg Oral TID    Antimicrobials: Anti-infectives (From admission, onward)    Start     Dose/Rate Route Frequency Ordered Stop   11/22/20 1000  remdesivir 100 mg in sodium chloride 0.9 % 100 mL IVPB       See Hyperspace for full Linked Orders Report.   100 mg 200 mL/hr over 30 Minutes Intravenous Daily 11/21/20 0957 11/26/20 0959   11/21/20 1100  remdesivir 200 mg in sodium chloride 0.9% 250 mL IVPB       See Hyperspace for full Linked Orders Report.   200 mg 580 mL/hr over 30 Minutes Intravenous Once 11/21/20 0957 11/21/20 1154   11/21/20 0800  cefTRIAXone (ROCEPHIN) 2 g in sodium chloride 0.9 % 100 mL IVPB  Status:  Discontinued        2 g 200 mL/hr over 30 Minutes Intravenous Every 24 hours 11/21/20 0747 11/21/20 1027   11/21/20 0800  azithromycin (ZITHROMAX) 500 mg in sodium chloride 0.9 % 250 mL IVPB  Status:  Discontinued        500 mg 250 mL/hr over 60 Minutes Intravenous Every 24 hours 11/21/20 0747 11/21/20 1027       PRN meds: acetaminophen, albuterol, dextromethorphan-guaiFENesin, hydrALAZINE, ondansetron (ZOFRAN) IV   Objective: Vitals:   11/23/20 0533 11/23/20 0907  BP: (!) 143/72 (!) 173/70  Pulse: 65 81  Resp: 16 20  Temp: 98.6 F (37 C) 98.3 F (36.8 C)  SpO2: 97% 97%    Intake/Output Summary (Last 24 hours) at 11/23/2020 1345 Last data filed at 11/23/2020 0500 Gross per 24 hour  Intake 222.36 ml  Output 1650 ml  Net -1427.64 ml    Filed Weights   11/21/20 0804  Weight: 121.7 kg   Weight change:  Body mass index is 37.43 kg/m.   Physical Exam: General exam: Pleasant, middle-aged African-American female.  Sitting up in chair.  Not in distress. Skin: No rashes, lesions or ulcers. HEENT: Atraumatic, normocephalic, no obvious bleeding Lungs: Clear to auscultation bilaterally CVS: Regular rate and  rhythm, no murmur GI/Abd soft, nontender, nondistended, bowel sound present CNS: Alert, awake, oriented x3, at baseline, she has deficits in the lower extremities because of multiple sclerosis Psychiatry: Mood appropriate Extremities: No pedal edema, no calf tenderness  Data Review: I have personally reviewed the laboratory data and studies available.  Recent Labs  Lab 11/21/20 0756 11/22/20 0437 11/23/20 0511  WBC 6.7 3.8*  2.7*  NEUTROABS 5.6 2.9 1.5*  HGB 13.5 12.5 12.9  HCT 38.1 34.5* 36.3  MCV 77.1* 75.7* 76.4*  PLT 224 196 197    Recent Labs  Lab 11/21/20 0756 11/22/20 0437 11/23/20 0511  NA 140 138 140  K 3.8 3.6 3.6  CL 107 109 106  CO2 24 24 25   GLUCOSE 99 103* 92  BUN 20 14 15   CREATININE 0.64 0.65 0.56  CALCIUM 9.0 8.6* 8.6*     F/u labs ordered Unresulted Labs (From admission, onward)     Start     Ordered   11/22/20 0500  Comprehensive metabolic panel  Daily,   STAT      11/21/20 1234   11/22/20 0500  CBC with Differential/Platelet  Daily,   STAT      11/21/20 1234   11/22/20 0500  C-reactive protein  Daily,   STAT      11/21/20 1234   11/21/20 1234  Expectorated Sputum Assessment w Gram Stain, Rflx to Resp Cult  Once,   STAT        11/21/20 1234            Signed, 11/23/20, MD [facility] 11/23/2020

## 2020-11-23 NOTE — Progress Notes (Signed)
Inpatient Rehab Admissions Coordinator: .  I am following for potential CIR admit; however, Pt. Currently Total assist and  on COVID precautions. I would like to see how she progresses  with COVID treatment and with treatment for MS flare, as I believe she would struggle with the intensity of CIR currently.   Megan Salon, MS, CCC-SLP Rehab Admissions Coordinator  [phone] (celll) [phone] (office)

## 2020-11-24 LAB — COMPREHENSIVE METABOLIC PANEL
ALT: 129 U/L — ABNORMAL HIGH (ref 0–44)
AST: 91 U/L — ABNORMAL HIGH (ref 15–41)
Albumin: 3.2 g/dL — ABNORMAL LOW (ref 3.5–5.0)
Alkaline Phosphatase: 102 U/L (ref 38–126)
Anion gap: 11 (ref 5–15)
BUN: 16 mg/dL (ref 6–20)
CO2: 24 mmol/L (ref 22–32)
Calcium: 9 mg/dL (ref 8.9–10.3)
Chloride: 105 mmol/L (ref 98–111)
Creatinine, Ser: 0.62 mg/dL (ref 0.44–1.00)
GFR, Estimated: >60 mL/min (ref >60)
Glucose, Bld: 147 mg/dL — ABNORMAL HIGH (ref 70–99)
Potassium: 3.9 mmol/L (ref 3.5–5.1)
Sodium: 140 mmol/L (ref 135–145)
Total Bilirubin: 0.7 mg/dL (ref 0.3–1.2)
Total Protein: 6.3 g/dL — ABNORMAL LOW (ref 6.5–8.1)

## 2020-11-24 LAB — CBC WITH DIFFERENTIAL/PLATELET
Abs Immature Granulocytes: 0.01 10*3/uL (ref 0.00–0.07)
Basophils Absolute: 0 10*3/uL (ref 0.0–0.1)
Basophils Relative: 0 %
Eosinophils Absolute: 0 10*3/uL (ref 0.0–0.5)
Eosinophils Relative: 0 %
HCT: 39.2 % (ref 36.0–46.0)
Hemoglobin: 14 g/dL (ref 12.0–15.0)
Immature Granulocytes: 0 %
Lymphocytes Relative: 11 %
Lymphs Abs: 0.3 10*3/uL — ABNORMAL LOW (ref 0.7–4.0)
MCH: 26.6 pg (ref 26.0–34.0)
MCHC: 35.7 g/dL (ref 30.0–36.0)
MCV: 74.5 fL — ABNORMAL LOW (ref 80.0–100.0)
Monocytes Absolute: 0 10*3/uL — ABNORMAL LOW (ref 0.1–1.0)
Monocytes Relative: 2 %
Neutro Abs: 2 10*3/uL (ref 1.7–7.7)
Neutrophils Relative %: 87 %
Platelets: 220 10*3/uL (ref 150–400)
RBC: 5.26 MIL/uL — ABNORMAL HIGH (ref 3.87–5.11)
RDW: 13.8 % (ref 11.5–15.5)
WBC: 2.3 10*3/uL — ABNORMAL LOW (ref 4.0–10.5)
nRBC: 0 % (ref 0.0–0.2)

## 2020-11-24 LAB — C-REACTIVE PROTEIN: CRP: 0.8 mg/dL (ref <1.0)

## 2020-11-24 NOTE — Progress Notes (Signed)
Physical Therapy Treatment Patient Details Name: Lisa Williams MRN: [id number] DOB: May 08, 1961 Today's Date: 11/24/2020    History of Present Illness Pt is a 60 y/o F admitted on 11/21/20 with c/c of fever, chills, cough & weakness. Pt found to be COVID (+). PMH: MS, HTN, HLD, tobacco abuse, chronic galactorrhea, neurogenic bladder, SVT, chronic back pain    PT Comments    PT/OT co-treat 2/2 to pt requiring +2 assistance. She was extremely motivated and ready for session. Requires +2 assistance for all mobility however demonstrates much improved abilities/safety with standing and mobility. She was able to stand from progressively lowered bed height ~ 4 x. Stood pivot one time with +2 max assist. Will return this afternoon to assist RN staff with pt returning to bed. Highly recommend CIR at DC to address deficits while assisting pt to PLOF. Pt was ambulatory ~ 3 weeks prior.    Follow Up Recommendations  CIR     Equipment Recommendations  Other (comment) (defer to next level of care)       Precautions / Restrictions Precautions Precautions: Fall Precaution Comments: personal BLE AFOs/ PRAFOs Restrictions Weight Bearing Restrictions: No    Mobility  Bed Mobility Overal bed mobility: Needs Assistance Bed Mobility: Supine to Sit     Supine to sit: HOB elevated;Mod assist;Min assist     General bed mobility comments: Pt was able to progress from long sit EOB to short sit EOB with heavy use of bed rail and increased time to perform. Pt states," Dont help me, I got it."    Transfers Overall transfer level: Needs assistance Equipment used: Rolling walker (2 wheeled) Transfers: Sit to/from Stand Sit to Stand: Max assist;From elevated surface;Mod assist;+2 physical assistance;+2 safety/equipment         General transfer comment: Pt performed STS EOB several times prior to stand pivot to recliner. progressively lower bed height to stand from each trial. pt continues to  demonstarte improving strength and return in function daily. +2 max for stand pivot but +2 mod asist for STS EOB to RW. HR did elevate into 140s with exertion.  Ambulation/Gait      General Gait Details: was able to "march" in place a few times however fatigued quickly and was able to advance to taking steps. Will continue to focus on strengthening and improving abilities with pre-gait activity.     Balance Overall balance assessment: Needs assistance Sitting-balance support: Feet supported;Bilateral upper extremity supported Sitting balance-Leahy Scale: Good Sitting balance - Comments: Occasional MIN A required for sitting balance during dynamic sitting tasks, balance much improved from previous date per pt report.   Standing balance support: Bilateral upper extremity supported;During functional activity Standing balance-Leahy Scale: Fair Standing balance comment: requires B knee block      Cognition Arousal/Alertness: Awake/alert Behavior During Therapy: WFL for tasks assessed/performed Overall Cognitive Status: Within Functional Limits for tasks assessed      General Comments: Pt is A and O x 4 and extremely motivated      Exercises Other Exercises Other Exercises: OT facilitates seated LB dressing task, education on safe transfer technique, energy conservation strategies (with considerations for recent COVID-19 diagnosis), and supports functional mobility during session with PTA.    General Comments General comments (skin integrity, edema, etc.): Vitals monitored t/o session. Telemetry monitor intermittently indicating tachypnea and elevated heart rate. HR noted to reach 141 with exertional activity as highest rate during session.      Pertinent Vitals/Pain Pain Assessment: No/denies pain  PT Goals (current goals can now be found in the care plan section) Acute Rehab PT Goals Patient Stated Goal: return to PLOF Progress towards PT goals: Progressing toward goals     Frequency    7X/week      PT Plan Current plan remains appropriate    Co-evaluation   Reason for Co-Treatment: Complexity of the patient's impairments (multi-system involvement);For patient/therapist safety;To address functional/ADL transfers PT goals addressed during session: Mobility/safety with mobility;Balance;Proper use of DME;Strengthening/ROM OT goals addressed during session: ADL's and self-care;Proper use of Adaptive equipment and DME      AM-PAC PT "6 Clicks" Mobility   Outcome Measure  Help needed turning from your back to your side while in a flat bed without using bedrails?: A Lot Help needed moving from lying on your back to sitting on the side of a flat bed without using bedrails?: A Lot Help needed moving to and from a bed to a chair (including a wheelchair)?: Total Help needed standing up from a chair using your arms (e.g., wheelchair or bedside chair)?: Total Help needed to walk in hospital room?: Total Help needed climbing 3-5 steps with a railing? : Total 6 Click Score: 8    End of Session Equipment Utilized During Treatment: Gait belt Activity Tolerance: Patient tolerated treatment well Patient left: in chair;with call bell/phone within reach;with chair alarm set;with nursing/sitter in room Nurse Communication: Mobility status PT Visit Diagnosis: Unsteadiness on feet (R26.81);Difficulty in walking, not elsewhere classified (R26.2);Muscle weakness (generalized) (M62.81)     Time: 1610-9604 PT Time Calculation (min) (ACUTE ONLY): 51 min  Charges:  $Therapeutic Activity: 23-37 mins                     Jetta Lout PTA 11/24/20, 12:21 PM

## 2020-11-24 NOTE — Progress Notes (Signed)
Occupational Therapy Treatment Patient Details Name: Lisa Williams MRN: [id number] DOB: 26-May-1960 Today's Date: 11/24/2020    History of present illness Pt is a 60 y/o F admitted on 11/21/20 with c/c of fever, chills, cough & weakness. Pt found to be COVID (+). PMH: MS, HTN, HLD, tobacco abuse, chronic galactorrhea, neurogenic bladder, SVT, chronic back pain   OT comments  Lisa Williams was seen for OT-PT co-treatment on this date. Upon arrival to room pt awake alert, semi-supine in bed. She is eager to participate in therapy session and reports her MS symptoms feel much improved this date. Pt is very motivated to be as independent as possible t/o session. She regularly states "let me try" or "let me do it" when engaging in functional activities. She is eager to do as much as possible during therapy session. Pt completed LB dressing tasks while seated EOB with MOD A. She maintains static and dynamic sitting balance during functional tasks with intermittent MIN A to maintain upright positioning. She engages in functional mobility with MOD A +2. See ADL section below for additional information regarding occupational performance. Pt educated on falls prevention strategies and energy conservation techniques t/o session. She return demonstrates understanding of PLB technique during functional mobility. Pt making good progress toward goals and continues to benefit from skilled OT services to maximize return to PLOF and minimize risk of future falls, injury, caregiver burden, and readmission. Will continue to follow POC. Discharge recommendation remains appropriate.    Follow Up Recommendations  CIR    Equipment Recommendations       Recommendations for Other Services      Precautions / Restrictions Precautions Precautions: Fall Precaution Comments: personal BLE AFOs Restrictions Weight Bearing Restrictions: No       Mobility Bed Mobility Overal bed mobility: Needs Assistance Bed  Mobility: Supine to Sit     Supine to sit: HOB elevated;Mod assist;+2 for physical assistance;Min assist     General bed mobility comments: Assist for BLE mgt, trunk elevation, and initial sitting balance at EOB.    Transfers Overall transfer level: Needs assistance Equipment used: Rolling walker (2 wheeled) Transfers: Sit to/from Stand Sit to Stand: Max assist;From elevated surface;Mod assist;+2 physical assistance;+2 safety/equipment              Balance Overall balance assessment: Needs assistance Sitting-balance support: Feet supported;Bilateral upper extremity supported Sitting balance-Leahy Scale: Good Sitting balance - Comments: Occasional MIN A required for sitting balance during dynamic sitting tasks, balance much improved from previous date per pt report.   Standing balance support: Bilateral upper extremity supported;During functional activity Standing balance-Leahy Scale: Poor Standing balance comment: requires B knee block                           ADL either performed or assessed with clinical judgement   ADL Overall ADL's : Needs assistance/impaired                     Lower Body Dressing: Maximal assistance;Moderate assistance;Sitting/lateral leans Lower Body Dressing Details (indicate cue type and reason): MOD A to don bilat socks, AFOs, & shoes this date. Pt requires physical assist to bring ankle over knee into crossed leg position when donning LB clothing this date. She is able to don all items with occasional MIN A to maintain sitting balance at EOB. MAX A to doff shoes, AFOs, & socks this date. Pt continues to require +2 assist for any STS  dressing tasks.             Functional mobility during ADLs: +2 for physical assistance;Rolling walker;Maximal assistance General ADL Comments: MOD A +2 for bilat knee block and assist with lift off from seated surface. Pt performs STSx3 from bed at moderate elevation. MAX A +2 for SPT to room  recliner.     Vision Baseline Vision/History: Wears glasses Wears Glasses: At all times Patient Visual Report: No change from baseline     Perception     Praxis      Cognition Arousal/Alertness: Awake/alert Behavior During Therapy: WFL for tasks assessed/performed Overall Cognitive Status: Within Functional Limits for tasks assessed                                 General Comments: Pt is A and O x 4 and extremely motivated        Exercises Other Exercises Other Exercises: OT facilitates seated LB dressing task, education on safe transfer technique, energy conservation strategies (with considerations for recent COVID-19 diagnosis), and supports functional mobility during session with PTA.   Shoulder Instructions       General Comments Vitals monitored t/o session. Telemetry monitor intermittently indicating tachypnea and elevated heart rate. HR noted to reach 141 with exertional activity as highest rate during session.    Pertinent Vitals/ Pain       Pain Assessment: No/denies pain  Home Living                                          Prior Functioning/Environment              Frequency  Min 3X/week        Progress Toward Goals  OT Goals(current goals can now be found in the care plan section)  Progress towards OT goals: Progressing toward goals  Acute Rehab OT Goals Patient Stated Goal: return to PLOF OT Goal Formulation: With patient Time For Goal Achievement: 12/06/20 Potential to Achieve Goals: Good  Plan Discharge plan remains appropriate;Frequency remains appropriate    Co-evaluation    PT/OT/SLP Co-Evaluation/Treatment: Yes Reason for Co-Treatment: Complexity of the patient's impairments (multi-system involvement);For patient/therapist safety;To address functional/ADL transfers PT goals addressed during session: Mobility/safety with mobility;Balance;Proper use of DME;Strengthening/ROM OT goals addressed during  session: ADL's and self-care;Proper use of Adaptive equipment and DME      AM-PAC OT "6 Clicks" Daily Activity     Outcome Measure   Help from another person eating meals?: None Help from another person taking care of personal grooming?: A Little Help from another person toileting, which includes using toliet, bedpan, or urinal?: A Lot Help from another person bathing (including washing, rinsing, drying)?: A Lot Help from another person to put on and taking off regular upper body clothing?: A Little Help from another person to put on and taking off regular lower body clothing?: A Lot 6 Click Score: 16    End of Session Equipment Utilized During Treatment: Rolling walker;Gait belt  OT Visit Diagnosis: Other abnormalities of gait and mobility (R26.89);Muscle weakness (generalized) (M62.81)   Activity Tolerance Patient tolerated treatment well   Patient Left in chair;with call bell/phone within reach;with chair alarm set;Other (comment) (With bilat PRAFOs donned.)   Nurse Communication          Time: [phone] OT Time Calculation (  min): 54 min  Charges: OT General Charges $OT Visit: 1 Visit OT Treatments $Self Care/Home Management : 8-22 mins $Therapeutic Activity: 8-22 mins  Rockney Ghee, M.S., OTR/L Ascom: [phone] 11/24/20, 12:01 PM

## 2020-11-24 NOTE — Progress Notes (Signed)
Inpatient Rehab Admissions Coordinator:   Pt. Continues to require increased assistance over her baseline with mobility and ADLs. Therapists continue to recommend CIR. I will continue to follow and will likely begin work-up if needs persist once treatment for MS flare and COVID are completed and Pt. Is off covid precautions.   Megan Salon, MS, CCC-SLP Rehab Admissions Coordinator  [phone] (celll) [phone] (office)

## 2020-11-24 NOTE — Progress Notes (Addendum)
PROGRESS NOTE  Lisa SaleJacqueline Renee Kareem  ZOX:[id number] DOB: [birth year] DOA: 11/21/2020 PCP: Lauro RegulusAnderson, Marshall W, MD  Outpatient Specialists: Neurology, Dr. Anne HahnWillis Brief Narrative: Lisa Williams is a 60 y.o. female with PMH significant for multiple sclerosis, HTN, HLD, SVT, tobacco abuse, chronic galactorrhea, neurogenic bladder. Patient presented to the ED on 7/17 from home with complaint of fever up to 100.2, chills, cough mostly dry, intermittent chest pain and generalized weakness.   In the ED, patient was afebrile, 89% on room air Labs with white count 6.7 COVID PCR positive Chest x-ray negative for infiltration Admitted to hospitalist service for COVID infection. Neurology was also consulted because of suspicion of worsening multiple sclerosis deficits  Assessment & Plan: Principal Problem:   Acute respiratory disease due to COVID-19 virus Active Problems:   MS (multiple sclerosis) (HCC)   Essential hypertension   Neurogenic bladder   Weakness  COVID infection: +PCR 7/17, vaccinated x2. CXR negative. CRP and d-dimer negative.  - Continue isolation x10 days.  - Will finish out 5 days remdesivir (7/17 - 7/21) - Of note, pt was told by neurology not to pursue booster as she had severe weakness leading to hospitalization after 2nd inoculation.  - Continue supportive care: Vitamin C, Zinc, PRN inhalers, Tylenol, Antitussives (benzonatate/ Mucinex/Tussionex).   - Encourage OOB, IS  Generalized weakness: Suspect MS exacerbation vs. pseudoexacerbation due to covid infection.  - Continue PT/OT. Pt motivated to regain PLOF. - Neurology consulted, recommended MRI for eval of new lesions, though this was declined as the patient requires anesthesia for MRI. Subsequently, started on Solumedrol 1g daily x3 days (7/19 - 7/21).   Multiple sclerosis:  - Continue patient-supplied home Siponimod  - Continue home baclofen, neurontin, tizanidine - Continue steroids as  above  Elevated LFTs: Relatively stable.  - Ok to continue remdesivir, will monitor CMP   Essential hypertension -Continue to monitor blood pressure on irbesartan -IV hydralazine as needed   Neurogenic bladder - Continue oxybutynin  Obesity: Estimated body mass index is 37.43 kg/m as calculated from the following:   Height as of this encounter: 5\' 11"  (1.803 m).   Weight as of this encounter: 121.7 kg.  DVT prophylaxis: Lovenox Code Status: Full Family Communication: None at bedside Disposition Plan:  Status is: Inpatient  Remains inpatient appropriate because:Unsafe d/c plan and IV treatments appropriate due to intensity of illness or inability to take PO  Dispo: The patient is from: Home              Anticipated d/c is to: CIR - would be eligible after 10 days isolation              Patient currently is not medically stable to d/c.   Difficult to place patient No  Consultants:  Neurology  Procedures:  None  Antimicrobials: Remdesivir   Subjective: Weakness is significantly worse than baseline but very motivated and working with therapy as much as they are able to work with her. Mild cough, no dyspnea, no chest pain reported. Strength only slightly better than at admission.  Objective: Vitals:   11/23/20 1943 11/24/20 0544 11/24/20 0825 11/24/20 1525  BP: (!) 140/56 121/70 140/74 (!) 140/58  Pulse: 72 (!) 55 60 62  Resp: 18 16 20 20   Temp: 98.5 F (36.9 C) 97.9 F (36.6 C) 97.6 F (36.4 C) 98.7 F (37.1 C)  TempSrc: Oral Oral Oral Oral  SpO2: 97% 94% 99% 96%  Weight:      Height:  Intake/Output Summary (Last 24 hours) at 11/24/2020 1607 Last data filed at 11/24/2020 0343 Gross per 24 hour  Intake 340.12 ml  Output --  Net 340.12 ml   Filed Weights   11/21/20 0804  Weight: 121.7 kg    Gen: 60 y.o. female in no distress, pleasant Pulm: Non-labored breathing room air. Clear to auscultation bilaterally.  CV: Regular rate and rhythm. No murmur,  rub, or gallop. No JVD, trace pitting pedal edema. GI: Abdomen soft, non-tender, non-distended, with normoactive bowel sounds. No organomegaly or masses felt. Ext: Warm, dry Skin: No rashes, lesions or ulcers on visualized skin Neuro: Alert and oriented. Conversant, follows commands. Psych: Judgement and insight appear normal. Mood & affect appropriate.   Data Reviewed: I have personally reviewed following labs and imaging studies  CBC: Recent Labs  Lab 11/21/20 0756 11/22/20 0437 11/23/20 0511 11/24/20 0601  WBC 6.7 3.8* 2.7* 2.3*  NEUTROABS 5.6 2.9 1.5* 2.0  HGB 13.5 12.5 12.9 14.0  HCT 38.1 34.5* 36.3 39.2  MCV 77.1* 75.7* 76.4* 74.5*  PLT 224 196 197 220   Basic Metabolic Panel: Recent Labs  Lab 11/21/20 0756 11/22/20 0437 11/23/20 0511 11/24/20 0601  NA 140 138 140 140  K 3.8 3.6 3.6 3.9  CL 107 109 106 105  CO2 24 24 25 24   GLUCOSE 99 103* 92 147*  BUN 20 14 15 16   CREATININE 0.64 0.65 0.56 0.62  CALCIUM 9.0 8.6* 8.6* 9.0   GFR: Estimated Creatinine Clearance: 109 mL/min (by C-G formula based on SCr of 0.62 mg/dL). Liver Function Tests: Recent Labs  Lab 11/21/20 0756 11/22/20 0437 11/23/20 0511 11/24/20 0601  AST 87* 70* 101* 91*  ALT 122* 104* 127* 129*  ALKPHOS 117 97 88 102  BILITOT 0.8 0.7 0.6 0.7  PROT 7.3 6.1* 6.0* 6.3*  ALBUMIN 4.0 3.2* 3.2* 3.2*   No results for input(s): LIPASE, AMYLASE in the last 168 hours. No results for input(s): AMMONIA in the last 168 hours. Coagulation Profile: Recent Labs  Lab 11/21/20 0756  INR 1.1   Cardiac Enzymes: No results for input(s): CKTOTAL, CKMB, CKMBINDEX, TROPONINI in the last 168 hours. BNP (last 3 results) No results for input(s): PROBNP in the last 8760 hours. HbA1C: No results for input(s): HGBA1C in the last 72 hours. CBG: No results for input(s): GLUCAP in the last 168 hours. Lipid Profile: No results for input(s): CHOL, HDL, LDLCALC, TRIG, CHOLHDL, LDLDIRECT in the last 72  hours. Thyroid Function Tests: No results for input(s): TSH, T4TOTAL, FREET4, T3FREE, THYROIDAB in the last 72 hours. Anemia Panel: No results for input(s): VITAMINB12, FOLATE, FERRITIN, TIBC, IRON, RETICCTPCT in the last 72 hours. Urine analysis:    Component Value Date/Time   COLORURINE YELLOW (A) 11/21/2020 0746   APPEARANCEUR HAZY (A) 11/21/2020 0746   APPEARANCEUR Hazy (A) 10/05/2020 1411   LABSPEC 1.024 11/21/2020 0746   LABSPEC 1.030 07/31/2011 1838   PHURINE 7.0 11/21/2020 0746   GLUCOSEU NEGATIVE 11/21/2020 0746   GLUCOSEU Negative 07/31/2011 1838   HGBUR NEGATIVE 11/21/2020 0746   BILIRUBINUR NEGATIVE 11/21/2020 0746   BILIRUBINUR Negative 10/05/2020 1411   BILIRUBINUR Negative 07/31/2011 1838   KETONESUR NEGATIVE 11/21/2020 0746   PROTEINUR NEGATIVE 11/21/2020 0746   NITRITE NEGATIVE 11/21/2020 0746   LEUKOCYTESUR NEGATIVE 11/21/2020 0746   LEUKOCYTESUR Negative 07/31/2011 1838   Recent Results (from the past 240 hour(s))  Resp Panel by RT-PCR (Flu A&B, Covid) Nasopharyngeal Swab     Status: Abnormal   Collection Time: 11/21/20  7:46 AM   Specimen: Nasopharyngeal Swab; Nasopharyngeal(NP) swabs in vial transport medium  Result Value Ref Range Status   SARS Coronavirus 2 by RT PCR POSITIVE (A) NEGATIVE Final    Comment: RESULT CALLED TO, READ BACK BY AND VERIFIED WITH: ASHLEY MURRAY AT 0911 11/21/20.PMF (NOTE) SARS-CoV-2 target nucleic acids are DETECTED.  The SARS-CoV-2 RNA is generally detectable in upper respiratory specimens during the acute phase of infection. Positive results are indicative of the presence of the identified virus, but do not rule out bacterial infection or co-infection with other pathogens not detected by the test. Clinical correlation with patient history and other diagnostic information is necessary to determine patient infection status. The expected result is Negative.  Fact Sheet for  Patients: BloggerCourse.com  Fact Sheet for Healthcare Providers: SeriousBroker.it  This test is not yet approved or cleared by the [place] FDA and  has been authorized for detection and/or diagnosis of SARS-CoV-2 by FDA under an Emergency Use Authorization (EUA).  This EUA will remain in effect (meaning this test can be  used) for the duration of  the COVID-19 declaration under Section 564(b)(1) of the Act, 21 U.S.C. section 360bbb-3(b)(1), unless the authorization is terminated or revoked sooner.     Influenza A by PCR NEGATIVE NEGATIVE Final   Influenza B by PCR NEGATIVE NEGATIVE Final    Comment: (NOTE) The Xpert Xpress SARS-CoV-2/FLU/RSV plus assay is intended as an aid in the diagnosis of influenza from Nasopharyngeal swab specimens and should not be used as a sole basis for treatment. Nasal washings and aspirates are unacceptable for Xpert Xpress SARS-CoV-2/FLU/RSV testing.  Fact Sheet for Patients: BloggerCourse.com  Fact Sheet for Healthcare Providers: SeriousBroker.it  This test is not yet approved or cleared by the [place] FDA and has been authorized for detection and/or diagnosis of SARS-CoV-2 by FDA under an Emergency Use Authorization (EUA). This EUA will remain in effect (meaning this test can be used) for the duration of the COVID-19 declaration under Section 564(b)(1) of the Act, 21 U.S.C. section 360bbb-3(b)(1), unless the authorization is terminated or revoked.  Performed at [facility], [address], [place], [place] [address]   Urine Culture     Status: Abnormal   Collection Time: 11/21/20  7:46 AM   Specimen: Urine, Random  Result Value Ref Range Status   Specimen Description   Final    URINE, RANDOM Performed at [facility], [address] [place], [place] [address]    Special Requests   Final    NONE Performed  at [facility], [address] [place], [place] [address]    Culture (A)  Final    10,000 COLONIES/mL MULTIPLE SPECIES PRESENT, SUGGEST RECOLLECTION   Report Status 11/22/2020 FINAL  Final  Blood Culture (routine x 2)     Status: None (Preliminary result)   Collection Time: 11/21/20  7:56 AM   Specimen: BLOOD  Result Value Ref Range Status   Specimen Description BLOOD RIGHT HAND  Final   Special Requests   Final    BOTTLES DRAWN AEROBIC AND ANAEROBIC Blood Culture adequate volume   Culture   Final    NO GROWTH 3 DAYS Performed at [facility] [address], [place], [place] [address]    Report Status PENDING  Incomplete  Blood Culture (routine x 2)     Status: None (Preliminary result)   Collection Time: 11/21/20  7:56 AM   Specimen: BLOOD  Result Value Ref Range Status  Specimen Description BLOOD RIGHT [facility]  Final   Special Requests   Final    BOTTLES DRAWN AEROBIC AND ANAEROBIC Blood Culture adequate volume   Culture   Final    NO GROWTH 3 DAYS Performed at [facility], [address] [place], [place] [address]    Report Status PENDING  Incomplete      Radiology Studies: No results found.  Scheduled Meds:  baclofen  20 mg Oral TID   benzonatate  200 mg Oral TID   cholecalciferol  2,000 Units Oral Daily   enoxaparin (LOVENOX) injection  0.5 mg/kg Subcutaneous Q24H   gabapentin  100 mg Oral BID   irbesartan  150 mg Oral Daily   oxybutynin  10 mg Oral Daily   pantoprazole (PROTONIX) IV  40 mg Intravenous Q12H   Siponimod Fumarate  2 mg Oral Daily   tiZANidine  2 mg Oral TID   Continuous Infusions:  methylPREDNISolone (SOLU-MEDROL) injection 1,000 mg (11/24/20 1117)   remdesivir 100 mg in NS 100 mL 100 mg (11/24/20 0959)     LOS: 3 days   Time spent: 35 minutes.  Tyrone Nine, MD [facility] [email or web address] 11/24/2020, 4:07 PM

## 2020-11-25 ENCOUNTER — Ambulatory Visit: Payer: PPO

## 2020-11-25 NOTE — NC FL2 (Signed)
[place] MEDICAID FL2 LEVEL OF CARE SCREENING TOOL     IDENTIFICATION  Patient Name: Lisa Williams Birthdate: 10/07/1960 Sex: female Admission Date (Current Location): 11/21/2020  [facility] and [place] Number:  Chiropodist and Address:  [facility], [address] [place], [place] [address]      Provider Number: [id number]  Attending Physician Name and Address:  Tyrone Nine, MD  Relative Name and Phone Number:       Current Level of Care: Hospital Recommended Level of Care: Skilled Nursing Facility Prior Approval Number:    Date Approved/Denied:   PASRR Number: [id number]  Discharge Plan: SNF    Current Diagnoses: Patient Active Problem List   Diagnosis Date Noted   Acute respiratory disease due to COVID-19 virus 11/21/2020   Weakness    Hypoalbuminemia due to protein-calorie malnutrition [facility])    Neurogenic bowel    Neurogenic bladder    Labile blood pressure    Neuropathic pain    Abscess of female pelvis    SVT (supraventricular tachycardia) (HCC)    Radial styloid tenosynovitis 03/12/2018   Wheelchair confinement 02/27/2018   Localized osteoporosis with current pathological fracture with routine healing 01/19/2017   Wrist fracture 01/16/2017   Sprain of ankle 03/23/2016   Closed fracture of lateral malleolus 03/16/2016   Health care maintenance 01/24/2016   Blood pressure elevated without history of HTN 10/25/2015   Essential hypertension 10/25/2015   Multiple sclerosis (HCC) 10/02/2015   Chronic left shoulder pain 07/19/2015   Multiple sclerosis exacerbation (HCC) 07/14/2015   MS (multiple sclerosis) (HCC) 11/26/2014   Increased body mass index 11/26/2014   HPV test positive 11/26/2014   Status post laparoscopic supracervical hysterectomy 11/26/2014   Galactorrhea 11/26/2014   Back ache 05/21/2014   Adiposity 05/21/2014   Disordered sleep 05/21/2014   Muscle spasticity 05/21/2014   Spasticity  05/21/2014   Calculus of kidney 12/09/2013   Renal colic 12/09/2013   Hypercholesteremia 08/19/2013   Hereditary and idiopathic neuropathy 08/19/2013   Hypercholesterolemia without hypertriglyceridemia 08/19/2013   Bladder infection, chronic 07/25/2012   Disorder of bladder function 07/25/2012   Incomplete bladder emptying 07/25/2012   Microscopic hematuria 07/25/2012   Right upper quadrant pain 07/25/2012    Orientation RESPIRATION BLADDER Height & Weight     Self, Time, Situation, Place  Normal Incontinent, External catheter Weight: 268 lb 6.4 oz (121.7 kg) Height:  5\' 11"  (180.3 cm)  BEHAVIORAL SYMPTOMS/MOOD NEUROLOGICAL BOWEL NUTRITION STATUS   (None)  (None) Continent Diet (Heart healthy)  AMBULATORY STATUS COMMUNICATION OF NEEDS Skin   Extensive Assist Verbally Normal                       Personal Care Assistance Level of Assistance  Bathing, Feeding, Dressing Bathing Assistance: Maximum assistance Feeding assistance: Limited assistance Dressing Assistance: Maximum assistance     Functional Limitations Info  Sight, Hearing, Speech Sight Info: Adequate Hearing Info: Adequate Speech Info: Adequate    SPECIAL CARE FACTORS FREQUENCY  PT (By licensed PT), OT (By licensed OT)     PT Frequency: 5 x week OT Frequency: 5 x week            Contractures Contractures Info: Not present    Additional Factors Info  Code Status, Allergies Code Status Info: Full code Allergies Info: Amlodipine, Povidone Iodine           Current Medications (11/25/2020):  This is the current hospital active medication list Current  Facility-Administered Medications  Medication Dose Route Frequency Provider Last Rate Last Admin   acetaminophen (TYLENOL) tablet 650 mg  650 mg Oral Q6H PRN Lorretta Harp, MD   650 mg at 11/24/20 7893   albuterol (VENTOLIN HFA) 108 (90 Base) MCG/ACT inhaler 2 puff  2 puff Inhalation Q4H PRN Lorretta Harp, MD       baclofen (LIORESAL) tablet 20 mg  20 mg  Oral TID Lorretta Harp, MD   20 mg at 11/24/20 2054   benzonatate (TESSALON) capsule 200 mg  200 mg Oral TID Lorin Glass, MD   200 mg at 11/24/20 2054   cholecalciferol (VITAMIN D3) tablet 2,000 Units  2,000 Units Oral Daily Lorretta Harp, MD   2,000 Units at 11/24/20 1002   enoxaparin (LOVENOX) injection 60 mg  0.5 mg/kg Subcutaneous Q24H Gardner Candle, RPH   60 mg at 11/24/20 2055   gabapentin (NEURONTIN) capsule 100 mg  100 mg Oral BID Lorretta Harp, MD   100 mg at 11/24/20 2054   guaiFENesin-dextromethorphan (ROBITUSSIN DM) 100-10 MG/5ML syrup 5 mL  5 mL Oral Q6H PRN Dahal, Melina Schools, MD       hydrALAZINE (APRESOLINE) injection 5 mg  5 mg Intravenous Q2H PRN Lorretta Harp, MD       irbesartan (AVAPRO) tablet 150 mg  150 mg Oral Daily Lorretta Harp, MD   150 mg at 11/24/20 1002   methylPREDNISolone sodium succinate (SOLU-MEDROL) 1,000 mg in sodium chloride 0.9 % 50 mL IVPB  1,000 mg Intravenous Daily Dahal, Melina Schools, MD 58 mL/hr at 11/24/20 1117 1,000 mg at 11/24/20 1117   ondansetron (ZOFRAN) injection 4 mg  4 mg Intravenous Q8H PRN Lorretta Harp, MD       oxybutynin (DITROPAN-XL) 24 hr tablet 10 mg  10 mg Oral Daily Lorretta Harp, MD   10 mg at 11/24/20 1003   pantoprazole (PROTONIX) injection 40 mg  40 mg Intravenous Q12H Dahal, Melina Schools, MD   40 mg at 11/24/20 2054   remdesivir 100 mg in sodium chloride 0.9 % 100 mL IVPB  100 mg Intravenous Daily Lorretta Harp, MD 200 mL/hr at 11/24/20 0959 100 mg at 11/24/20 8101   Siponimod Fumarate TABS 2 mg  2 mg Oral Daily Lorretta Harp, MD       tiZANidine (ZANAFLEX) tablet 2 mg  2 mg Oral TID Lorretta Harp, MD   2 mg at 11/24/20 2054     Discharge Medications: Please see discharge summary for a list of discharge medications.  Relevant Imaging Results:  Relevant Lab Results:   Additional Information SS#: [id number]. Tested positive for COVID on 7/17. Has two vaccines but no booster. Does not want booster because last vaccine caused MS flare up.  Margarito Liner,  LCSW

## 2020-11-25 NOTE — Progress Notes (Signed)
Occupational Therapy Treatment Patient Details Name: Lisa Williams MRN: [id number] DOB: 06-Jun-1960 Today's Date: 11/25/2020    History of present illness Pt is a 60 y/o F admitted on 11/21/20 with c/c of fever, chills, cough & weakness. Pt found to be COVID (+). PMH: Lisa, HTN, HLD, tobacco abuse, chronic galactorrhea, neurogenic bladder, SVT, chronic back pain   OT comments  Lisa Williams was seen for OT treatment on this date. Upon arrival to room pt seated EOB with PT, overlapping sessions for safe mobility. Pt requires SBA doff B AFOs and shoes. MAX A don B PRAFOS seated EOC. MOD A x2 + RW for bed>chair SPT - pt requires B knee block, use of momentum, and cues for upright posture. Pt requesting theraband for UE/LB seated exercises - demonstrated good recall of exercises from outpatient therapy and left with yellow/red therabands at end of session. Pt making good progress toward goals. Pt continues to benefit from skilled OT services to maximize return to PLOF and minimize risk of future falls, injury, caregiver burden, and readmission. Will continue to follow POC. Discharge recommendation remains appropriate.    Follow Up Recommendations  CIR    Equipment Recommendations  Other (comment) (defer to next venue of care)    Recommendations for Other Services      Precautions / Restrictions Precautions Precautions: Fall Precaution Comments: personal BLE AFOs/ PRAFOs Restrictions Weight Bearing Restrictions: No       Mobility Bed Mobility     General bed mobility comments: pt received sitting EOB with PT at bed side    Transfers Overall transfer level: Needs assistance Equipment used: Rolling walker (2 wheeled) Transfers: Stand Pivot Transfers;Sit to/from Stand Sit to Stand: Mod assist;+2 physical assistance Stand pivot transfers: +2 physical assistance;From elevated surface;Mod assist       General transfer comment: Requires B knee blocking, use of momentum, and cues  for upright posture    Balance Overall balance assessment: Needs assistance Sitting-balance support: Feet supported;Bilateral upper extremity supported Sitting balance-Leahy Scale: Good Sitting balance - Comments: close supervision   Standing balance support: Bilateral upper extremity supported;During functional activity Standing balance-Leahy Scale: Fair Standing balance comment: Pt is totally reliant on UE support to maintain standing.                           ADL either performed or assessed with clinical judgement   ADL Overall ADL's : Needs assistance/impaired                                       General ADL Comments: SBA doff B AFOs and shoes. MAX A don B PRAFOS seated EOC. MOD A x2 + RW for ADL t/f.      Cognition Arousal/Alertness: Awake/alert Behavior During Therapy: WFL for tasks assessed/performed Overall Cognitive Status: Within Functional Limits for tasks assessed                                 General Comments: Pt is A and O x 4 and extremely motivated        Exercises Exercises: Other exercises Other Exercises Other Exercises: Pt educated re: falls prevention, DME recs, d/c recs, HEP (theraband provided) Other Exercises: LBD, sit<>stand x3, sitting/standing balance/tolerance, SPT           Pertinent Vitals/ Pain  Pain Assessment: No/denies pain         Frequency  Min 3X/week        Progress Toward Goals  OT Goals(current goals can now be found in the care plan section)  Progress towards OT goals: Progressing toward goals  Acute Rehab OT Goals Patient Stated Goal: return to PLOF OT Goal Formulation: With patient Time For Goal Achievement: 12/06/20 Potential to Achieve Goals: Good ADL Goals Pt Will Perform Grooming: with modified independence;sitting Pt Will Perform Lower Body Dressing: with set-up;with supervision;sitting/lateral leans Pt Will Transfer to Toilet: with max assist;squat  pivot transfer;bedside commode  Plan Discharge plan remains appropriate;Frequency remains appropriate    Co-evaluation    PT/OT/SLP Co-Evaluation/Treatment: Yes Reason for Co-Treatment: For patient/therapist safety;To address functional/ADL transfers PT goals addressed during session: Mobility/safety with mobility OT goals addressed during session: ADL's and self-care      AM-PAC OT "6 Clicks" Daily Activity     Outcome Measure   Help from another person eating meals?: None Help from another person taking care of personal grooming?: A Little Help from another person toileting, which includes using toliet, bedpan, or urinal?: A Lot Help from another person bathing (including washing, rinsing, drying)?: A Lot Help from another person to put on and taking off regular upper body clothing?: A Little Help from another person to put on and taking off regular lower body clothing?: A Lot 6 Click Score: 16    End of Session Equipment Utilized During Treatment: Rolling walker;Gait belt  OT Visit Diagnosis: Other abnormalities of gait and mobility (R26.89);Muscle weakness (generalized) (M62.81)   Activity Tolerance Patient tolerated treatment well   Patient Left in chair;with call bell/phone within reach;with chair alarm set;with nursing/sitter in room;Other (comment) (B PRAFOs donned)   Nurse Communication Mobility status        Time: 8144-8185 OT Time Calculation (min): 28 min  Charges: OT General Charges $OT Visit: 1 Visit OT Treatments $Self Care/Home Management : 8-22 mins  Lisa Williams, M.S. OTR/L  11/25/20, 11:11 AM  ascom [phone]

## 2020-11-25 NOTE — Progress Notes (Signed)
PROGRESS NOTE  Lisa Williams  YJE:[id number] DOB: 1960-09-30 DOA: 11/21/2020 PCP: Lauro Regulus, MD  Outpatient Specialists: Neurology, Dr. Anne Hahn Brief Narrative: Lisa Williams is a 60 y.o. female with PMH significant for multiple sclerosis, HTN, HLD, SVT, tobacco abuse, chronic galactorrhea, neurogenic bladder. Patient presented to the ED on 7/17 from home with complaint of fever up to 100.2, chills, cough mostly dry, intermittent chest pain and generalized weakness.   In the ED, patient was afebrile, 89% on room air Labs with white count 6.7 COVID PCR positive Chest x-ray negative for infiltration Admitted to hospitalist service for COVID infection. Neurology was also consulted because of suspicion of worsening multiple sclerosis deficits  Assessment & Plan: Principal Problem:   Acute respiratory disease due to COVID-19 virus Active Problems:   MS (multiple sclerosis) (HCC)   Essential hypertension   Neurogenic bladder   Weakness  COVID infection: +PCR 7/17, vaccinated x2. CXR negative. CRP and d-dimer negative.  - Continue isolation x10 days.  - Will finish out 5 days remdesivir (7/17 - 7/21) today - Of note, pt was told by neurology not to pursue booster as she had severe weakness leading to hospitalization after 2nd inoculation.  - Continue supportive care: Vitamin C, Zinc, PRN inhalers, Tylenol, Antitussives (benzonatate/ Mucinex/Tussionex).   - Encourage OOB, IS  Generalized weakness: Suspect MS exacerbation vs. pseudoexacerbation due to covid infection.  - Continue PT/OT. Pt motivated to regain PLOF. - Neurology consulted, recommended MRI for eval of new lesions, though this was declined as the patient requires anesthesia for MRI. Subsequently, started on Solumedrol 1g daily x3 days (7/19 - 7/21).   Multiple sclerosis:  - Continue patient-supplied home Siponimod  - Continue home baclofen, neurontin, tizanidine - Continue steroids as  above  Leukopenia: Recheck in AM  Elevated LFTs: Relatively stable.  - Ok to continue remdesivir, will monitor CMP   Essential hypertension -Continue to monitor blood pressure on irbesartan -IV hydralazine as needed   Neurogenic bladder - Continue oxybutynin  Obesity: Estimated body mass index is 37.43 kg/m as calculated from the following:   Height as of this encounter: 5\' 11"  (1.803 m).   Weight as of this encounter: 121.7 kg.  DVT prophylaxis: Lovenox Code Status: Full Family Communication: None at bedside Disposition Plan:  Status is: Inpatient  Remains inpatient appropriate because:Unsafe d/c plan and IV treatments appropriate due to intensity of illness or inability to take PO  Dispo: The patient is from: Home              Anticipated d/c is to: CIR - would be eligible after 10 days isolation              Patient currently is not medically stable to d/c.   Difficult to place patient No  Consultants:  Neurology  Procedures:  None  Antimicrobials: Remdesivir   Subjective: Weakness is significantly worse than baseline but very motivated and working with therapy as much as they are able to work with her. Mild cough, no dyspnea, no chest pain reported. Strength only slightly better than at admission.  Objective: Vitals:   11/24/20 2006 11/25/20 0426 11/25/20 0815 11/25/20 1516  BP: (!) 99/53 124/64 (!) 138/55 (!) 130/54  Pulse: (!) 57 (!) 54 (!) 50 100  Resp: 18 18 16 16   Temp: 98.1 F (36.7 C) 98 F (36.7 C) 98.3 F (36.8 C) 98.5 F (36.9 C)  TempSrc: Oral Oral    SpO2: 97% 97% 99% 100%  Weight:  Height:       No intake or output data in the 24 hours ending 11/25/20 1702  Filed Weights   11/21/20 0804  Weight: 121.7 kg   Gen: 60 y.o. female in no distress Pulm: Nonlabored breathing room air.  Ext: Warm, dry no deformities Skin: No rashes, lesions or ulcers on visualized skin. Neuro: Alert and oriented. Diffuse weakness. Psych: Judgement and  insight appear fair. Mood euthymic & affect congruent. Behavior is appropriate, pleasant.    Data Reviewed: I have personally reviewed following labs and imaging studies  CBC: Recent Labs  Lab 11/21/20 0756 11/22/20 0437 11/23/20 0511 11/24/20 0601  WBC 6.7 3.8* 2.7* 2.3*  NEUTROABS 5.6 2.9 1.5* 2.0  HGB 13.5 12.5 12.9 14.0  HCT 38.1 34.5* 36.3 39.2  MCV 77.1* 75.7* 76.4* 74.5*  PLT 224 196 197 220   Basic Metabolic Panel: Recent Labs  Lab 11/21/20 0756 11/22/20 0437 11/23/20 0511 11/24/20 0601  NA 140 138 140 140  K 3.8 3.6 3.6 3.9  CL 107 109 106 105  CO2 24 24 25 24   GLUCOSE 99 103* 92 147*  BUN 20 14 15 16   CREATININE 0.64 0.65 0.56 0.62  CALCIUM 9.0 8.6* 8.6* 9.0   GFR: Estimated Creatinine Clearance: 109 mL/min (by C-G formula based on SCr of 0.62 mg/dL). Liver Function Tests: Recent Labs  Lab 11/21/20 0756 11/22/20 0437 11/23/20 0511 11/24/20 0601  AST 87* 70* 101* 91*  ALT 122* 104* 127* 129*  ALKPHOS 117 97 88 102  BILITOT 0.8 0.7 0.6 0.7  PROT 7.3 6.1* 6.0* 6.3*  ALBUMIN 4.0 3.2* 3.2* 3.2*   Coagulation Profile: Recent Labs  Lab 11/21/20 0756  INR 1.1   Urine analysis:    Component Value Date/Time   COLORURINE YELLOW (A) 11/21/2020 0746   APPEARANCEUR HAZY (A) 11/21/2020 0746   APPEARANCEUR Hazy (A) 10/05/2020 1411   LABSPEC 1.024 11/21/2020 0746   LABSPEC 1.030 07/31/2011 1838   PHURINE 7.0 11/21/2020 0746   GLUCOSEU NEGATIVE 11/21/2020 0746   GLUCOSEU Negative 07/31/2011 1838   HGBUR NEGATIVE 11/21/2020 0746   BILIRUBINUR NEGATIVE 11/21/2020 0746   BILIRUBINUR Negative 10/05/2020 1411   BILIRUBINUR Negative 07/31/2011 1838   KETONESUR NEGATIVE 11/21/2020 0746   PROTEINUR NEGATIVE 11/21/2020 0746   NITRITE NEGATIVE 11/21/2020 0746   LEUKOCYTESUR NEGATIVE 11/21/2020 0746   LEUKOCYTESUR Negative 07/31/2011 1838   Recent Results (from the past 240 hour(s))  Resp Panel by RT-PCR (Flu A&B, Covid) Nasopharyngeal Swab     Status:  Abnormal   Collection Time: 11/21/20  7:46 AM   Specimen: Nasopharyngeal Swab; Nasopharyngeal(NP) swabs in vial transport medium  Result Value Ref Range Status   SARS Coronavirus 2 by RT PCR POSITIVE (A) NEGATIVE Final    Comment: RESULT CALLED TO, READ BACK BY AND VERIFIED WITH: ASHLEY MURRAY AT 0911 11/21/20.PMF (NOTE) SARS-CoV-2 target nucleic acids are DETECTED.  The SARS-CoV-2 RNA is generally detectable in upper respiratory specimens during the acute phase of infection. Positive results are indicative of the presence of the identified virus, but do not rule out bacterial infection or co-infection with other pathogens not detected by the test. Clinical correlation with patient history and other diagnostic information is necessary to determine patient infection status. The expected result is Negative.  Fact Sheet for Patients: 0912  Fact Sheet for Healthcare Providers: 11/23/20  This test is not yet approved or cleared by the [employer] FDA and  has been authorized for detection and/or diagnosis of SARS-CoV-2 by FDA  under an Emergency Use Authorization (EUA).  This EUA will remain in effect (meaning this test can be  used) for the duration of  the COVID-19 declaration under Section 564(b)(1) of the Act, 21 U.S.C. section 360bbb-3(b)(1), unless the authorization is terminated or revoked sooner.     Influenza A by PCR NEGATIVE NEGATIVE Final   Influenza B by PCR NEGATIVE NEGATIVE Final    Comment: (NOTE) The Xpert Xpress SARS-CoV-2/FLU/RSV plus assay is intended as an aid in the diagnosis of influenza from Nasopharyngeal swab specimens and should not be used as a sole basis for treatment. Nasal washings and aspirates are unacceptable for Xpert Xpress SARS-CoV-2/FLU/RSV testing.  Fact Sheet for Patients: [employer]  Fact Sheet for Healthcare  Providers: [employer]it  This test is not yet approved or cleared by the [place] FDA and has been authorized for detection and/or diagnosis of SARS-CoV-2 by FDA under an Emergency Use Authorization (EUA). This EUA will remain in effect (meaning this test can be used) for the duration of the COVID-19 declaration under Section 564(b)(1) of the Act, 21 U.S.C. section 360bbb-3(b)(1), unless the authorization is terminated or revoked.  Performed at [facility], [address], [place], [place] [address]   Urine Culture     Status: Abnormal   Collection Time: 11/21/20  7:46 AM   Specimen: Urine, Random  Result Value Ref Range Status   Specimen Description   Final    URINE, RANDOM Performed at [facility], [address], [place], [place] [address]    Special Requests   Final    NONE Performed at [facility], [address] [place], [place] [address]    Culture (A)  Final    10,000 COLONIES/mL MULTIPLE SPECIES PRESENT, SUGGEST RECOLLECTION   Report Status 11/22/2020 FINAL  Final  Blood Culture (routine x 2)     Status: None (Preliminary result)   Collection Time: 11/21/20  7:56 AM   Specimen: BLOOD  Result Value Ref Range Status   Specimen Description BLOOD RIGHT HAND  Final   Special Requests   Final    BOTTLES DRAWN AEROBIC AND ANAEROBIC Blood Culture adequate volume   Culture   Final    NO GROWTH 4 DAYS Performed at [facility] [address] [place], [place] [address]    Report Status PENDING  Incomplete  Blood Culture (routine x 2)     Status: None (Preliminary result)   Collection Time: 11/21/20  7:56 AM   Specimen: BLOOD  Result Value Ref Range Status   Specimen Description BLOOD RIGHT [facility]  Final   Special Requests   Final    BOTTLES DRAWN AEROBIC AND ANAEROBIC Blood Culture adequate volume   Culture   Final    NO GROWTH 4 DAYS Performed at [facility], [address],  [place], [place] [address]    Report Status PENDING  Incomplete      Radiology Studies: No results found.  Scheduled Meds:  baclofen  20 mg Oral TID   benzonatate  200 mg Oral TID   cholecalciferol  2,000 Units Oral Daily   enoxaparin (LOVENOX) injection  0.5 mg/kg Subcutaneous Q24H   gabapentin  100 mg Oral BID   irbesartan  150 mg Oral Daily   oxybutynin  10 mg Oral Daily   pantoprazole (PROTONIX) IV  40 mg Intravenous Q12H   Siponimod Fumarate  2 mg Oral Daily   tiZANidine  2 mg Oral TID   Continuous Infusions:  LOS: 4 days   Time spent: 25 minutes.  Tyrone Nine, MD [facility] [email or web address] 11/25/2020, 5:02 PM

## 2020-11-25 NOTE — Progress Notes (Signed)
Physical Therapy Treatment Patient Details Name: Lisa Williams MRN: [id number] DOB: 1961/02/01 Today's Date: 11/25/2020    History of Present Illness Pt is a 60 y/o F admitted on 11/21/20 with c/c of fever, chills, cough & weakness. Pt found to be COVID (+). PMH: MS, HTN, HLD, tobacco abuse, chronic galactorrhea, neurogenic bladder, SVT, chronic back pain    PT Comments    Pt was long sitting in bed eager for PT session. +2 assist for safety with physical assistance with STS transfers. Pt has continued to progress daily and has demonstrated improved strength and mobility. Pt is not at baseline. Baseline; pt able to ambulate with RW however currently only able to take a few steps from EOB to recliner. Pt is extremely motivated and pleasant. Highly recommend DC to CIR when deemed stable to assist pt to PLOF + increasing independence with ADLs.    Follow Up Recommendations  CIR;Other (comment) (will require extensive PT to assist pt to PLOF)     Equipment Recommendations  Other (comment) (defer to next level of care)       Precautions / Restrictions Precautions Precautions: Fall Precaution Comments: personal BLE AFOs/ PRAFOs Restrictions Weight Bearing Restrictions: No    Mobility  Bed Mobility Overal bed mobility: Needs Assistance Bed Mobility: Supine to Sit     Supine to sit: Min guard;Min assist;HOB elevated     General bed mobility comments: Pt was able to progress from long sit to short sit EOB with HOB elevated + increased time. continues to demonstrate improving strength and sitting balance.    Transfers Overall transfer level: Needs assistance Equipment used: Rolling walker (2 wheeled) (personal RW used) Transfers: Sit to/from Stand Sit to Stand: From elevated surface;+2 safety/equipment;+2 physical assistance;Min assist;Mod assist         General transfer comment: Pt was able to STS from EOB 1 x with +2 assistance. continues to require assistance for  fwd wt shift and knee extension to prevent buckling. First trial STS EOB, had pt stand step to recliner prior to fatigue. HR more controlled today with peak elevation to 115bpm. She proceeded to STS ~ 4 more time from lower recliner height with mod assist +2. Improved standing tolerance and ability to clear floor while wt shifting in static standing. unsafe to advance to gait due to fatigue however will attempt next session prior to fatigue.  Ambulation/Gait Ambulation/Gait assistance: Min assist;+2 safety/equipment;Mod assist Gait Distance (Feet): 3 Feet Assistive device: Rolling walker (2 wheeled) Gait Pattern/deviations: Step-to pattern Gait velocity: decreased   General Gait Details: Heavy reliance on RW. constant vcs for breathing during activity. Knees are locked in extension while advancing/ taking step    Balance Overall balance assessment: Needs assistance Sitting-balance support: Feet supported;Bilateral upper extremity supported Sitting balance-Leahy Scale: Good Sitting balance - Comments: close supervision   Standing balance support: Bilateral upper extremity supported;During functional activity Standing balance-Leahy Scale: Fair Standing balance comment: Pt is totally reliant on UE support to maintain standing.      Cognition Arousal/Alertness: Awake/alert Behavior During Therapy: WFL for tasks assessed/performed Overall Cognitive Status: Within Functional Limits for tasks assessed        General Comments: Pt is A and O x 4 and extremely motivated             Pertinent Vitals/Pain Pain Assessment: No/denies pain     PT Goals (current goals can now be found in the care plan section) Acute Rehab PT Goals Patient Stated Goal: return to PLOF Progress  towards PT goals: Progressing toward goals    Frequency    7X/week      PT Plan Current plan remains appropriate    Co-evaluation     PT goals addressed during session: Mobility/safety with  mobility;Balance;Proper use of DME;Strengthening/ROM        AM-PAC PT "6 Clicks" Mobility   Outcome Measure  Help needed turning from your back to your side while in a flat bed without using bedrails?: A Little Help needed moving from lying on your back to sitting on the side of a flat bed without using bedrails?: A Lot Help needed moving to and from a bed to a chair (including a wheelchair)?: A Lot Help needed standing up from a chair using your arms (e.g., wheelchair or bedside chair)?: Total Help needed to walk in hospital room?: Total Help needed climbing 3-5 steps with a railing? : Total 6 Click Score: 10    End of Session Equipment Utilized During Treatment: Gait belt Activity Tolerance: Patient tolerated treatment well Patient left: in chair;with call bell/phone within reach;with chair alarm set;with nursing/sitter in room Nurse Communication: Mobility status PT Visit Diagnosis: Unsteadiness on feet (R26.81);Difficulty in walking, not elsewhere classified (R26.2);Muscle weakness (generalized) (M62.81)     Time: 6767-2094 PT Time Calculation (min) (ACUTE ONLY): 28 min  Charges:  $Therapeutic Activity: 23-37 mins                     Jetta Lout PTA 11/25/20, 10:25 AM

## 2020-11-26 LAB — CBC WITH DIFFERENTIAL/PLATELET
Abs Immature Granulocytes: 0.05 10*3/uL (ref 0.00–0.07)
Basophils Absolute: 0 10*3/uL (ref 0.0–0.1)
Basophils Relative: 0 %
Eosinophils Absolute: 0 10*3/uL (ref 0.0–0.5)
Eosinophils Relative: 0 %
HCT: 39.2 % (ref 36.0–46.0)
Hemoglobin: 13.9 g/dL (ref 12.0–15.0)
Immature Granulocytes: 1 %
Lymphocytes Relative: 4 %
Lymphs Abs: 0.3 10*3/uL — ABNORMAL LOW (ref 0.7–4.0)
MCH: 26.9 pg (ref 26.0–34.0)
MCHC: 35.5 g/dL (ref 30.0–36.0)
MCV: 76 fL — ABNORMAL LOW (ref 80.0–100.0)
Monocytes Absolute: 0.5 10*3/uL (ref 0.1–1.0)
Monocytes Relative: 6 %
Neutro Abs: 7.5 10*3/uL (ref 1.7–7.7)
Neutrophils Relative %: 89 %
Platelets: 221 10*3/uL (ref 150–400)
RBC: 5.16 MIL/uL — ABNORMAL HIGH (ref 3.87–5.11)
RDW: 13.8 % (ref 11.5–15.5)
WBC: 8.3 10*3/uL (ref 4.0–10.5)
nRBC: 0 % (ref 0.0–0.2)

## 2020-11-26 LAB — COMPREHENSIVE METABOLIC PANEL
ALT: 111 U/L — ABNORMAL HIGH (ref 0–44)
AST: 53 U/L — ABNORMAL HIGH (ref 15–41)
Albumin: 3.2 g/dL — ABNORMAL LOW (ref 3.5–5.0)
Alkaline Phosphatase: 87 U/L (ref 38–126)
Anion gap: 8 (ref 5–15)
BUN: 35 mg/dL — ABNORMAL HIGH (ref 6–20)
CO2: 28 mmol/L (ref 22–32)
Calcium: 8.9 mg/dL (ref 8.9–10.3)
Chloride: 106 mmol/L (ref 98–111)
Creatinine, Ser: 0.7 mg/dL (ref 0.44–1.00)
GFR, Estimated: >60 mL/min (ref >60)
Glucose, Bld: 150 mg/dL — ABNORMAL HIGH (ref 70–99)
Potassium: 3.9 mmol/L (ref 3.5–5.1)
Sodium: 142 mmol/L (ref 135–145)
Total Bilirubin: 0.6 mg/dL (ref 0.3–1.2)
Total Protein: 6.4 g/dL — ABNORMAL LOW (ref 6.5–8.1)

## 2020-11-26 LAB — CULTURE, BLOOD (ROUTINE X 2)
Culture: NO GROWTH
Culture: NO GROWTH
Special Requests: ADEQUATE
Special Requests: ADEQUATE

## 2020-11-26 LAB — TSH: TSH: 0.412 u[IU]/mL (ref 0.350–4.500)

## 2020-11-26 NOTE — Progress Notes (Signed)
PROGRESS NOTE  Lisa Williams  ERD:[id number] DOB: Jun 25, 1960 DOA: 11/21/2020 PCP: Lauro Regulus, MD  Outpatient Specialists: Neurology, Dr. Anne Hahn Brief Narrative: Lisa Williams is a 60 y.o. female with PMH significant for multiple sclerosis, HTN, HLD, SVT, tobacco abuse, chronic galactorrhea, neurogenic bladder. Patient presented to the ED on 7/17 from home with complaint of fever up to 100.2, chills, cough mostly dry, intermittent chest pain and generalized weakness.   In the ED, patient was afebrile, 89% on room air Labs with white count 6.7 COVID PCR positive Chest x-ray negative for infiltration Admitted to hospitalist service for COVID infection. Neurology was also consulted because of suspicion of worsening multiple sclerosis deficits  Assessment & Plan: Principal Problem:   Acute respiratory disease due to COVID-19 virus Active Problems:   MS (multiple sclerosis) (HCC)   Essential hypertension   Neurogenic bladder   Weakness  COVID infection: +PCR 7/17, vaccinated x2. CXR negative. CRP and d-dimer negative.  - Continue isolation x10 days.  - Completed 5 days remdesivir (7/17 - 7/21)  - Of note, pt was told by neurology not to pursue booster as she had severe weakness leading to hospitalization after 2nd inoculation.  - Continue supportive care: Vitamin C, Zinc, PRN inhalers, Tylenol, Antitussives (benzonatate/ Mucinex/Tussionex).   - Encourage OOB, IS  Sinus bradycardia: Asymptomatic without prolonged pause or hypotension. No classically offensive medications, though remdesivir can cause this at times (has completed course), and siponimod is known to cause bradycardia with 1st dose, but not thereafter. Hasn't withdrawn from anticholinergic oxybutynin. - Confirm by ECG - Check TSH (last checked Jan 2022 was 7.7) - Continue to avoid negative chronotropes. If persists or becomes symptomatic, would require cardiology evaluation.   Generalized  weakness: Suspect MS exacerbation vs. pseudoexacerbation due to covid infection.  - Continue PT/OT. Pt motivated to regain PLOF. - Neurology consulted, recommended MRI for eval of new lesions, though this was declined as the patient requires anesthesia for MRI. Subsequently, started on Solumedrol 1g daily x3 days (7/19 - 7/21).   Multiple sclerosis:  - Continue patient-supplied home Siponimod  - Continue home baclofen, neurontin, tizanidine  Lymphopenia: Persistent, due to viral infection. No longer neutropenic.   Elevated LFTs: Improving.    Essential hypertension: Well controlled. - Continue ARB. Not on negative chronotropic agent.    Neurogenic bladder - Continue oxybutynin  Obesity: Estimated body mass index is 37.43 kg/m as calculated from the following:   Height as of this encounter: 5\' 11"  (1.803 m).   Weight as of this encounter: 121.7 kg.  DVT prophylaxis: Lovenox Code Status: Full Family Communication: None at bedside Disposition Plan:  Status is: Inpatient  Remains inpatient appropriate because:Unsafe d/c plan and IV treatments appropriate due to intensity of illness or inability to take PO  Dispo: The patient is from: Home              Anticipated d/c is to: CIR - would be eligible after 10 days isolation              Patient currently is not medically stable to d/c.   Difficult to place patient No  Consultants:  Neurology  Procedures:  None  Antimicrobials: Remdesivir   Subjective: Continues to work diligently with therapy, feels she's regaining strength bit by bit, still not at/near PLOF. No new numbness, weakness. No chest pain or dyspnea, though fatigues easily. HR went low last night into 40's. Her HR has tended to run low in the past. She doesn't have  and hasn't had syncope/presyncope, palpitations, or other symptoms.   Objective: Vitals:   11/25/20 0815 11/25/20 1516 11/25/20 2111 11/26/20 0436  BP: (!) 138/55 (!) 130/54 (!) 104/55 125/64  Pulse:  (!) 50 100 (!) 49 (!) 44  Resp: 16 16 18 18   Temp: 98.3 F (36.8 C) 98.5 F (36.9 C) 98.3 F (36.8 C) 98.2 F (36.8 C)  TempSrc:   Oral Oral  SpO2: 99% 100% 98% 96%  Weight:      Height:        Intake/Output Summary (Last 24 hours) at 11/26/2020 1532 Last data filed at 11/26/2020 11/28/2020 Gross per 24 hour  Intake 0 ml  Output 300 ml  Net -300 ml    Filed Weights   11/21/20 0804  Weight: 121.7 kg   Gen: 60 y.o. female in no distress Pulm: Nonlabored breathing room air. Clear. CV: Regular with rate of about 60. No murmur, rub, or gallop. No JVD, no pitting dependent edema. GI: Abdomen soft, non-tender, non-distended, with normoactive bowel sounds.  Ext: Warm, dry Skin: No new rashes, lesions or ulcers on visualized skin. Neuro: Alert and oriented. No new focal neurological deficits. Psych: Judgement and insight appear intact. Mood euthymic & affect congruent. Behavior is appropriate.    Data Reviewed: I have personally reviewed following labs and imaging studies  CBC: Recent Labs  Lab 11/21/20 0756 11/22/20 0437 11/23/20 0511 11/24/20 0601 11/26/20 0647  WBC 6.7 3.8* 2.7* 2.3* 8.3  NEUTROABS 5.6 2.9 1.5* 2.0 7.5  HGB 13.5 12.5 12.9 14.0 13.9  HCT 38.1 34.5* 36.3 39.2 39.2  MCV 77.1* 75.7* 76.4* 74.5* 76.0*  PLT 224 196 197 220 221   Basic Metabolic Panel: Recent Labs  Lab 11/21/20 0756 11/22/20 0437 11/23/20 0511 11/24/20 0601 11/26/20 0647  NA 140 138 140 140 142  K 3.8 3.6 3.6 3.9 3.9  CL 107 109 106 105 106  CO2 24 24 25 24 28   GLUCOSE 99 103* 92 147* 150*  BUN 20 14 15 16  35*  CREATININE 0.64 0.65 0.56 0.62 0.70  CALCIUM 9.0 8.6* 8.6* 9.0 8.9   GFR: Estimated Creatinine Clearance: 109 mL/min (by C-G formula based on SCr of 0.7 mg/dL). Liver Function Tests: Recent Labs  Lab 11/21/20 0756 11/22/20 0437 11/23/20 0511 11/24/20 0601 11/26/20 0647  AST 87* 70* 101* 91* 53*  ALT 122* 104* 127* 129* 111*  ALKPHOS 117 97 88 102 87  BILITOT 0.8 0.7  0.6 0.7 0.6  PROT 7.3 6.1* 6.0* 6.3* 6.4*  ALBUMIN 4.0 3.2* 3.2* 3.2* 3.2*   Coagulation Profile: Recent Labs  Lab 11/21/20 0756  INR 1.1   Urine analysis:    Component Value Date/Time   COLORURINE YELLOW (A) 11/21/2020 0746   APPEARANCEUR HAZY (A) 11/21/2020 0746   APPEARANCEUR Hazy (A) 10/05/2020 1411   LABSPEC 1.024 11/21/2020 0746   LABSPEC 1.030 07/31/2011 1838   PHURINE 7.0 11/21/2020 0746   GLUCOSEU NEGATIVE 11/21/2020 0746   GLUCOSEU Negative 07/31/2011 1838   HGBUR NEGATIVE 11/21/2020 0746   BILIRUBINUR NEGATIVE 11/21/2020 0746   BILIRUBINUR Negative 10/05/2020 1411   BILIRUBINUR Negative 07/31/2011 1838   KETONESUR NEGATIVE 11/21/2020 0746   PROTEINUR NEGATIVE 11/21/2020 0746   NITRITE NEGATIVE 11/21/2020 0746   LEUKOCYTESUR NEGATIVE 11/21/2020 0746   LEUKOCYTESUR Negative 07/31/2011 1838   Recent Results (from the past 240 hour(s))  Resp Panel by RT-PCR (Flu A&B, Covid) Nasopharyngeal Swab     Status: Abnormal   Collection Time: 11/21/20  7:46 AM  Specimen: Nasopharyngeal Swab; Nasopharyngeal(NP) swabs in vial transport medium  Result Value Ref Range Status   SARS Coronavirus 2 by RT PCR POSITIVE (A) NEGATIVE Final    Comment: RESULT CALLED TO, READ BACK BY AND VERIFIED WITH: ASHLEY MURRAY AT 0911 11/21/20.PMF (NOTE) SARS-CoV-2 target nucleic acids are DETECTED.  The SARS-CoV-2 RNA is generally detectable in upper respiratory specimens during the acute phase of infection. Positive results are indicative of the presence of the identified virus, but do not rule out bacterial infection or co-infection with other pathogens not detected by the test. Clinical correlation with patient history and other diagnostic information is necessary to determine patient infection status. The expected result is Negative.  Fact Sheet for Patients: BloggerCourse.com  Fact Sheet for Healthcare  Providers: SeriousBroker.it  This test is not yet approved or cleared by the [place] FDA and  has been authorized for detection and/or diagnosis of SARS-CoV-2 by FDA under an Emergency Use Authorization (EUA).  This EUA will remain in effect (meaning this test can be  used) for the duration of  the COVID-19 declaration under Section 564(b)(1) of the Act, 21 U.S.C. section 360bbb-3(b)(1), unless the authorization is terminated or revoked sooner.     Influenza A by PCR NEGATIVE NEGATIVE Final   Influenza B by PCR NEGATIVE NEGATIVE Final    Comment: (NOTE) The Xpert Xpress SARS-CoV-2/FLU/RSV plus assay is intended as an aid in the diagnosis of influenza from Nasopharyngeal swab specimens and should not be used as a sole basis for treatment. Nasal washings and aspirates are unacceptable for Xpert Xpress SARS-CoV-2/FLU/RSV testing.  Fact Sheet for Patients: BloggerCourse.com  Fact Sheet for Healthcare Providers: SeriousBroker.it  This test is not yet approved or cleared by the [place] FDA and has been authorized for detection and/or diagnosis of SARS-CoV-2 by FDA under an Emergency Use Authorization (EUA). This EUA will remain in effect (meaning this test can be used) for the duration of the COVID-19 declaration under Section 564(b)(1) of the Act, 21 U.S.C. section 360bbb-3(b)(1), unless the authorization is terminated or revoked.  Performed at  [facility], [address], [place], [place] [address]   Urine Culture     Status: Abnormal   Collection Time: 11/21/20  7:46 AM   Specimen: Urine, Random  Result Value Ref Range Status   Specimen Description   Final    URINE, RANDOM Performed at [facility], [address] [place], [place] [address]    Special Requests   Final    NONE Performed at [facility], [address] [place], [place] [address]     Culture (A)  Final    10,000 COLONIES/mL MULTIPLE SPECIES PRESENT, SUGGEST RECOLLECTION   Report Status 11/22/2020 FINAL  Final  Blood Culture (routine x 2)     Status: None   Collection Time: 11/21/20  7:56 AM   Specimen: BLOOD  Result Value Ref Range Status   Specimen Description BLOOD RIGHT HAND  Final   Special Requests   Final    BOTTLES DRAWN AEROBIC AND ANAEROBIC Blood Culture adequate volume   Culture   Final    NO GROWTH 5 DAYS Performed at [facility], [address] [place], [place] [address]    Report Status 11/26/2020 FINAL  Final  Blood Culture (routine x 2)     Status: None   Collection Time: 11/21/20  7:56 AM   Specimen: BLOOD  Result Value Ref Range Status   Specimen Description BLOOD RIGHT [facility]  Final  Special Requests   Final    BOTTLES DRAWN AEROBIC AND ANAEROBIC Blood Culture adequate volume   Culture   Final    NO GROWTH 5 DAYS Performed at [facility], [address], [place], [place] [address]    Report Status 11/26/2020 FINAL  Final      Radiology Studies: No results found.  Scheduled Meds:  baclofen  20 mg Oral TID   benzonatate  200 mg Oral TID   cholecalciferol  2,000 Units Oral Daily   enoxaparin (LOVENOX) injection  0.5 mg/kg Subcutaneous Q24H   gabapentin  100 mg Oral BID   irbesartan  150 mg Oral Daily   oxybutynin  10 mg Oral Daily   pantoprazole (PROTONIX) IV  40 mg Intravenous Q12H   Siponimod Fumarate  2 mg Oral Daily   tiZANidine  2 mg Oral TID   Continuous Infusions:     LOS: 5 days   Time spent: 35 minutes.  Tyrone Nine, MD [facility] [email or web address] 11/26/2020, 3:32 PM

## 2020-11-26 NOTE — Progress Notes (Signed)
Physical Therapy Treatment Patient Details Name: Lisa Williams MRN: [id number] DOB: 26-Aug-1960 Today's Date: 11/26/2020    History of Present Illness Pt is a 60 y/o F admitted on 11/21/20 with c/c of fever, chills, cough & weakness. Pt found to be COVID (+). PMH: MS, HTN, HLD, tobacco abuse, chronic galactorrhea, neurogenic bladder, SVT, chronic back pain    PT Comments    Pt was long sitting in bed upon arriving. She continues to be highly motivated and cooperative throughout. Resting HR in 40s. MD is aware and  pt reports no symptoms or concerns. HR did elevate to 90s with activity but once she had seated rest, returned to 40s. Pt continues to progress well with PT. She was able to exit bed with heavy use of bed rails + HOB elevated and CGA for safety. She stood 4 x throughout session from progressively lower surface height. She even was able to progress to ambulation 2 x 15 ft with mod assist + chair follow. Overall pt is highly motivated and appropriate for CIR. Recommend CIR to assist pt to PLOF while improving independence with ADLs.      Follow Up Recommendations  CIR     Equipment Recommendations  Other (comment) (defer to next level of care)       Precautions / Restrictions Precautions Precautions: Fall Precaution Comments: personal BLE AFOs/ PRAFOs Restrictions Weight Bearing Restrictions: No    Mobility  Bed Mobility Overal bed mobility: Needs Assistance Bed Mobility: Supine to Sit     Supine to sit: Min guard;HOB elevated     General bed mobility comments: Pt was able to exit R side of bed with heavy use of bed rails however only CGA    Transfers Overall transfer level: Needs assistance Equipment used: Rolling walker (2 wheeled) Transfers: Sit to/from Stand Sit to Stand: Max assist;+2 physical assistance;+2 safety/equipment;Mod assist (min +2, max + 1)         General transfer comment: Pt was able to stand 4 x throughout session. with +2 min  assist or max assist of one.  Ambulation/Gait Ambulation/Gait assistance: +2 safety/equipment;Mod assist Gait Distance (Feet): 15 Feet Assistive device: Rolling walker (2 wheeled) Gait Pattern/deviations: Step-to pattern;Wide base of support (knees in extension) Gait velocity: decreased   General Gait Details: Pt was able to ambulate 2 x 15 ft with very ridgid gait kinematics. At baseline pt able to ambulate with +1 assist. Currently requiring +2 for safety for chair follow and safety. mod assist to lateral wt shift to allow opposite LE advancement.    Balance Overall balance assessment: Needs assistance Sitting-balance support: Feet supported;Bilateral upper extremity supported Sitting balance-Leahy Scale: Good Sitting balance - Comments: close supervision   Standing balance support: Bilateral upper extremity supported;During functional activity Standing balance-Leahy Scale: Fair Standing balance comment: Pt is totally reliant on UE support to maintain standing however no LOB or unsteadiness. knees locked in extension.      Cognition Arousal/Alertness: Awake/alert Behavior During Therapy: WFL for tasks assessed/performed Overall Cognitive Status: Within Functional Limits for tasks assessed      General Comments: Pt is A and O x 4 and extremely motivated             Pertinent Vitals/Pain Pain Assessment: No/denies pain     PT Goals (current goals can now be found in the care plan section) Acute Rehab PT Goals Patient Stated Goal: return to PLOF Progress towards PT goals: Progressing toward goals    Frequency    7X/week  PT Plan Current plan remains appropriate    Co-evaluation     PT goals addressed during session: Mobility/safety with mobility;Balance;Proper use of DME;Strengthening/ROM        AM-PAC PT "6 Clicks" Mobility   Outcome Measure  Help needed turning from your back to your side while in a flat bed without using bedrails?: A Little Help  needed moving from lying on your back to sitting on the side of a flat bed without using bedrails?: A Lot Help needed moving to and from a bed to a chair (including a wheelchair)?: A Lot Help needed standing up from a chair using your arms (e.g., wheelchair or bedside chair)?: A Lot Help needed to walk in hospital room?: Total Help needed climbing 3-5 steps with a railing? : Total 6 Click Score: 11    End of Session Equipment Utilized During Treatment: Gait belt Activity Tolerance: Patient tolerated treatment well Patient left: with call bell/phone within reach;in bed;with bed alarm set Nurse Communication: Mobility status PT Visit Diagnosis: Unsteadiness on feet (R26.81);Difficulty in walking, not elsewhere classified (R26.2);Muscle weakness (generalized) (M62.81)     Time: 4496-7591 PT Time Calculation (min) (ACUTE ONLY): 34 min  Charges:  $Gait Training: 8-22 mins $Therapeutic Activity: 8-22 mins                     Jetta Lout PTA 11/26/20, 3:15 PM

## 2020-11-26 NOTE — Progress Notes (Signed)
Inpatient Rehab Admissions Coordinator:   Note that pt. Is progressing well with therapies. Per acute MD, Pt. May come off COVID precautions 12/01/20. I will continue to follow for potential CIR admission and initiate request for insurance auth next week if pt. Continues to require intensity of CIR.   Megan Salon, MS, CCC-SLP Rehab Admissions Coordinator  [phone] (celll) [phone] (office)

## 2020-11-27 MED ORDER — TIZANIDINE HCL 2 MG PO TABS
1.0000 mg | ORAL_TABLET | Freq: Two times a day (BID) | ORAL | Status: DC
  Administered 2020-11-27 – 2020-11-28 (×3): 1 mg via ORAL
  Filled 2020-11-27 (×4): qty 0.5

## 2020-11-27 MED ORDER — PANTOPRAZOLE SODIUM 40 MG PO TBEC
40.0000 mg | DELAYED_RELEASE_TABLET | Freq: Two times a day (BID) | ORAL | Status: DC
  Administered 2020-11-27 – 2020-12-02 (×11): 40 mg via ORAL
  Filled 2020-11-27 (×11): qty 1

## 2020-11-27 NOTE — Progress Notes (Signed)
PHARMACIST - PHYSICIAN COMMUNICATION  CONCERNING: IV to Oral Route Change Policy  RECOMMENDATION: This patient is receiving pantoprazole by the intravenous route.  Based on criteria approved by the Pharmacy and Therapeutics Committee, the intravenous medication(s) is/are being converted to the equivalent oral dose form(s).   DESCRIPTION: These criteria include: The patient is eating (either orally or via tube) and/or has been taking other orally administered medications for a least 24 hours The patient has no evidence of active gastrointestinal bleeding or impaired GI absorption (gastrectomy, short bowel, patient on TNA or NPO).  If you have questions about this conversion, please contact the Pharmacy Department   Tressie Ellis, [facility] 11/27/2020 8:50 AM

## 2020-11-27 NOTE — Progress Notes (Signed)
PROGRESS NOTE  Lisa Williams Math  GYI:[id number] DOB: [birth year] DOA: 11/21/2020 PCP: Lauro Regulus, MD  Outpatient Specialists: Neurology, Dr. Anne Hahn Brief Narrative: Lisa Williams is a 60 y.o. female with PMH significant for multiple sclerosis, HTN, HLD, SVT, tobacco abuse, chronic galactorrhea, neurogenic bladder. Patient presented to the ED on 7/17 from home with complaint of fever up to 100.2, chills, cough mostly dry, intermittent chest pain and generalized weakness.   In the ED, patient was afebrile, 89% on room air Labs with white count 6.7 COVID PCR positive Chest x-ray negative for infiltration Admitted to hospitalist service for COVID infection. Neurology was also consulted because of suspicion of worsening multiple sclerosis deficits  Assessment & Plan: Principal Problem:   Acute respiratory disease due to COVID-19 virus Active Problems:   MS (multiple sclerosis) (HCC)   Essential hypertension   Neurogenic bladder   Weakness  COVID infection: +PCR 7/17, vaccinated x2. CXR negative. CRP and d-dimer negative.  - Continue isolation x10 days.  - Completed 5 days remdesivir (7/17 - 7/21)  - Of note, pt was told by neurology not to pursue booster as she had severe weakness leading to hospitalization after 2nd inoculation.   Sinus bradycardia: Asymptomatic without prolonged pause or hypotension. No classically offensive medications, though tizanidine may cause bradycardia in overdose (LFTs elevated and drug is hepatically metabolized, so despite low dose (2mg  TID), could feasibly be contributing to acute findings. Remdesivir can cause this at times (has completed course), and siponimod is known to cause bradycardia with 1st dose, but not thereafter. Hasn't withdrawn from anticholinergic oxybutynin. TSH wnl.  - Confirmed by ECG. Telemetry reveals sinus bradycardia without AV block with intermittent junctional bradycardia once sinus rate drops below 40-45.  -  Decrease tizanidine.  - Continue to avoid negative chronotropes. If persists or becomes symptomatic, would require cardiology evaluation.   Generalized weakness: Suspect MS exacerbation vs. pseudoexacerbation due to covid infection.  - Continue PT/OT. Pt motivated to regain PLOF. - Neurology consulted, recommended MRI for eval of new lesions, though this was declined as the patient requires anesthesia for MRI.   - Received Solumedrol 1g daily x3 days (7/19 - 7/21).   Multiple sclerosis:  - Continue patient-supplied home Siponimod  - Continue home baclofen, neurontin, tizanidine  Lymphopenia: Persistent, due to viral infection. No longer neutropenic.   Elevated LFTs: Improving.    Essential hypertension: Well controlled. - Continue ARB. Not on negative chronotropic agent.    Neurogenic bladder - Continue oxybutynin  Obesity: Estimated body mass index is 37.43 kg/m as calculated from the following:   Height as of this encounter: 5\' 11"  (1.803 m).   Weight as of this encounter: 121.7 kg.  DVT prophylaxis: Lovenox Code Status: Full Family Communication: None at bedside Disposition Plan:  Status is: Inpatient  Remains inpatient appropriate because:Unsafe d/c plan and IV treatments appropriate due to intensity of illness or inability to take PO  Dispo: The patient is from: Home              Anticipated d/c is to: CIR - would be eligible after 10 days isolation              Patient currently is not medically stable to d/c.   Difficult to place patient No  Consultants:  Neurology  Procedures:  None  Antimicrobials: Remdesivir   Subjective: Continues to get up with therapy daily and is progressing. No palpitations, dyspnea, chest pain, orthostatic hypotension or other hypotension. HRs low during day, lower  when sleeping but responsive when exerting herself.   Objective: Vitals:   11/26/20 1627 11/26/20 2045 11/27/20 0345 11/27/20 0741  BP: 122/60 112/61 122/68 (!) 119/59   Pulse: (!) 43 (!) 42 (!) 41 (!) 40  Resp: 16 18 18 18   Temp:    98.9 F (37.2 C)  TempSrc:      SpO2: 96% 97% 98% 100%  Weight:      Height:       No intake or output data in the 24 hours ending 11/27/20 1046   Filed Weights   11/21/20 0804  Weight: 121.7 kg   Gen: 60 y.o. female in no distress Pulm: Nonlabored breathing room air. Clear. CV: Regular bradycardia. No murmur, rub, or gallop. No JVD, mild pitting dependent edema. GI: Abdomen soft, non-tender, non-distended, with normoactive bowel sounds.  Ext: Warm, no deformities Skin: No new rashes, lesions or ulcers on visualized skin. Neuro: Alert and oriented. No new focal neurological deficits. Psych: Judgement and insight appear fair. Mood euthymic & affect congruent. Behavior is appropriate.    Data Reviewed: I have personally reviewed following labs and imaging studies  CBC: Recent Labs  Lab 11/21/20 0756 11/22/20 0437 11/23/20 0511 11/24/20 0601 11/26/20 0647  WBC 6.7 3.8* 2.7* 2.3* 8.3  NEUTROABS 5.6 2.9 1.5* 2.0 7.5  HGB 13.5 12.5 12.9 14.0 13.9  HCT 38.1 34.5* 36.3 39.2 39.2  MCV 77.1* 75.7* 76.4* 74.5* 76.0*  PLT 224 196 197 220 221   Basic Metabolic Panel: Recent Labs  Lab 11/21/20 0756 11/22/20 0437 11/23/20 0511 11/24/20 0601 11/26/20 0647  NA 140 138 140 140 142  K 3.8 3.6 3.6 3.9 3.9  CL 107 109 106 105 106  CO2 24 24 25 24 28   GLUCOSE 99 103* 92 147* 150*  BUN 20 14 15 16  35*  CREATININE 0.64 0.65 0.56 0.62 0.70  CALCIUM 9.0 8.6* 8.6* 9.0 8.9   GFR: Estimated Creatinine Clearance: 109 mL/min (by C-G formula based on SCr of 0.7 mg/dL). Liver Function Tests: Recent Labs  Lab 11/21/20 0756 11/22/20 0437 11/23/20 0511 11/24/20 0601 11/26/20 0647  AST 87* 70* 101* 91* 53*  ALT 122* 104* 127* 129* 111*  ALKPHOS 117 97 88 102 87  BILITOT 0.8 0.7 0.6 0.7 0.6  PROT 7.3 6.1* 6.0* 6.3* 6.4*  ALBUMIN 4.0 3.2* 3.2* 3.2* 3.2*   Coagulation Profile: Recent Labs  Lab 11/21/20 0756  INR  1.1   Urine analysis:    Component Value Date/Time   COLORURINE YELLOW (A) 11/21/2020 0746   APPEARANCEUR HAZY (A) 11/21/2020 0746   APPEARANCEUR Hazy (A) 10/05/2020 1411   LABSPEC 1.024 11/21/2020 0746   LABSPEC 1.030 07/31/2011 1838   PHURINE 7.0 11/21/2020 0746   GLUCOSEU NEGATIVE 11/21/2020 0746   GLUCOSEU Negative 07/31/2011 1838   HGBUR NEGATIVE 11/21/2020 0746   BILIRUBINUR NEGATIVE 11/21/2020 0746   BILIRUBINUR Negative 10/05/2020 1411   BILIRUBINUR Negative 07/31/2011 1838   KETONESUR NEGATIVE 11/21/2020 0746   PROTEINUR NEGATIVE 11/21/2020 0746   NITRITE NEGATIVE 11/21/2020 0746   LEUKOCYTESUR NEGATIVE 11/21/2020 0746   LEUKOCYTESUR Negative 07/31/2011 1838   Recent Results (from the past 240 hour(s))  Resp Panel by RT-PCR (Flu A&B, Covid) Nasopharyngeal Swab     Status: Abnormal   Collection Time: 11/21/20  7:46 AM   Specimen: Nasopharyngeal Swab; Nasopharyngeal(NP) swabs in vial transport medium  Result Value Ref Range Status   SARS Coronavirus 2 by RT PCR POSITIVE (A) NEGATIVE Final    Comment: RESULT CALLED  TO, READ BACK BY AND VERIFIED WITH: ASHLEY MURRAY AT 0911 11/21/20.PMF (NOTE) SARS-CoV-2 target nucleic acids are DETECTED.  The SARS-CoV-2 RNA is generally detectable in upper respiratory specimens during the acute phase of infection. Positive results are indicative of the presence of the identified virus, but do not rule out bacterial infection or co-infection with other pathogens not detected by the test. Clinical correlation with patient history and other diagnostic information is necessary to determine patient infection status. The expected result is Negative.  Fact Sheet for Patients: BloggerCourse.com  Fact Sheet for Healthcare Providers: SeriousBroker.it  This test is not yet approved or cleared by the [place] FDA and  has been authorized for detection and/or diagnosis of SARS-CoV-2 by FDA  under an Emergency Use Authorization (EUA).  This EUA will remain in effect (meaning this test can be  used) for the duration of  the COVID-19 declaration under Section 564(b)(1) of the Act, 21 U.S.C. section 360bbb-3(b)(1), unless the authorization is terminated or revoked sooner.     Influenza A by PCR NEGATIVE NEGATIVE Final   Influenza B by PCR NEGATIVE NEGATIVE Final    Comment: (NOTE) The Xpert Xpress SARS-CoV-2/FLU/RSV plus assay is intended as an aid in the diagnosis of influenza from Nasopharyngeal swab specimens and should not be used as a sole basis for treatment. Nasal washings and aspirates are unacceptable for Xpert Xpress SARS-CoV-2/FLU/RSV testing.  Fact Sheet for Patients: BloggerCourse.com  Fact Sheet for Healthcare Providers: SeriousBroker.it  This test is not yet approved or cleared by the [place] FDA and has been authorized for detection and/or diagnosis of SARS-CoV-2 by FDA under an Emergency Use Authorization (EUA). This EUA will remain in effect (meaning this test can be used) for the duration of the COVID-19 declaration under Section 564(b)(1) of the Act, 21 U.S.C. section 360bbb-3(b)(1), unless the authorization is terminated or revoked.  Performed at [facility], [address], [place], [place] [address]   Urine Culture     Status: Abnormal   Collection Time: 11/21/20  7:46 AM   Specimen: Urine, Random  Result Value Ref Range Status   Specimen Description   Final    URINE, RANDOM Performed at [facility], [address], [place], [place] [address]    Special Requests   Final    NONE Performed at [facility], [address] [place], [place] [address]    Culture (A)  Final    10,000 COLONIES/mL MULTIPLE SPECIES PRESENT, SUGGEST RECOLLECTION   Report Status 11/22/2020 FINAL  Final  Blood Culture (routine x 2)     Status: None   Collection Time: 11/21/20   7:56 AM   Specimen: BLOOD  Result Value Ref Range Status   Specimen Description BLOOD RIGHT HAND  Final   Special Requests   Final    BOTTLES DRAWN AEROBIC AND ANAEROBIC Blood Culture adequate volume   Culture   Final    NO GROWTH 5 DAYS Performed at [facility], [address], [place], [place] [address]    Report Status 11/26/2020 FINAL  Final  Blood Culture (routine x 2)     Status: None   Collection Time: 11/21/20  7:56 AM   Specimen: BLOOD  Result Value Ref Range Status   Specimen Description BLOOD RIGHT AC  Final   Special Requests   Final    BOTTLES DRAWN AEROBIC AND ANAEROBIC Blood Culture adequate volume   Culture   Final    NO GROWTH 5 DAYS Performed  at [facility], [address] [place], [place] [address]    Report Status 11/26/2020 FINAL  Final      Radiology Studies: No results found.  Scheduled Meds:  baclofen  20 mg Oral TID   benzonatate  200 mg Oral TID   cholecalciferol  2,000 Units Oral Daily   enoxaparin (LOVENOX) injection  0.5 mg/kg Subcutaneous Q24H   gabapentin  100 mg Oral BID   irbesartan  150 mg Oral Daily   oxybutynin  10 mg Oral Daily   pantoprazole  40 mg Oral BID   Siponimod Fumarate  2 mg Oral Daily   tiZANidine  1 mg Oral BID   Continuous Infusions:     LOS: 6 days   Time spent: 35 minutes.  Tyrone Nineyan B Neilan Rizzo, MD [facility] [email or web address] 11/27/2020, 10:46 AM

## 2020-11-27 NOTE — Progress Notes (Signed)
Physical Therapy Treatment Patient Details Name: Lisa Williams MRN: [id number] DOB: [birth year] Today's Date: 11/27/2020    History of Present Illness Pt is a 60 y/o F admitted on 11/21/20 with c/c of fever, chills, cough & weakness. Pt found to be COVID (+). PMH: MS, HTN, HLD, tobacco abuse, chronic galactorrhea, neurogenic bladder, SVT, chronic back pain    PT Comments    Pt was long sitting in bed eager for session upon arriving. She is A and O x 4. Huston Foley cardia ongoing with resting HR in upper 40s. MD is aware and has been modifying medications. Pt did require slightly more assistance to exit bed today. Author assisted pt with applying BLE AFO/shoes while pt was seated EOB. She stood 3 x throughout session from elevated surface height + max assist. Quad strength continues to be most limiting. Pt was able to ambulate 2 x 25 ft with RW + min/mod assist. Pt has knees locked in extension and has to use excessive lateral wt shift to allow LE advancement. Overall pt is progressing but not at her baseline. Author continues to feel pt is appropriate for CIR to assist her to PLOF. She was repositioned back in bed post session with Call bell in reach and RN tech in room.    Follow Up Recommendations  CIR     Equipment Recommendations  None recommended by PT       Precautions / Restrictions Precautions Precautions: Fall Precaution Comments: personal BLE AFOs/ PRAFOs Restrictions Weight Bearing Restrictions: No    Mobility  Bed Mobility Overal bed mobility: Needs Assistance Bed Mobility: Supine to Sit     Supine to sit: HOB elevated;Min assist Sit to supine: Mod assist;Max assist;HOB elevated   General bed mobility comments: Pt did require slightly more assistance to exit bed today. still requiring heavy use of bedrails + HOB elevated. Unable to progress BLEs back into bed without mod-max assist + increased time    Transfers Overall transfer level: Needs assistance Equipment  used: Rolling walker (2 wheeled) Transfers: Sit to/from Stand Sit to Stand: Max assist         General transfer comment: Max assist to STS 3 x throughout session. Quad strength most limiitng. Author protetcs knees from buckling upon standing. pt has to perform with BUE support on RW throughout transfers. poor eccentric control from stand to sit due to quad weakness.  Ambulation/Gait Ambulation/Gait assistance: Min assist;Mod assist Gait Distance (Feet): 25 Feet Assistive device: Rolling walker (2 wheeled) (used personal RW however recommend new RW due to pt's getting warn out) Gait Pattern/deviations: Wide base of support;Step-to pattern (knees locked in extension.) Gait velocity: decreased   General Gait Details: Knees are locked in extension with wide BOS. BLE AFO donned for all standing activity. Ambulated 2 x 25 ft with RW. pt is brady cardic at rest with HR elevation to 90s that quickly returns to 40s with seted rest. MD is aware and making medication adjustments. On 2nd gait trial pt's HR did not elevate and pt reports feeling " I just feel a little off." MD notified.     Balance Overall balance assessment: Needs assistance Sitting-balance support: Feet supported;Bilateral upper extremity supported Sitting balance-Leahy Scale: Good Sitting balance - Comments: close supervision   Standing balance support: Bilateral upper extremity supported;During functional activity Standing balance-Leahy Scale: Fair Standing balance comment: Pt is totally reliant on UE support to maintain standing however no LOB or unsteadiness. knees locked in extension.      Cognition Arousal/Alertness:  Awake/alert Behavior During Therapy: WFL for tasks assessed/performed Overall Cognitive Status: Within Functional Limits for tasks assessed      General Comments: Pt is A and O x 4 and extremely motivated             Pertinent Vitals/Pain Pain Assessment: No/denies pain     PT Goals (current  goals can now be found in the care plan section) Acute Rehab PT Goals Patient Stated Goal: return to PLOF Progress towards PT goals: Progressing toward goals    Frequency    7X/week      PT Plan Current plan remains appropriate    Co-evaluation     PT goals addressed during session: Mobility/safety with mobility        AM-PAC PT "6 Clicks" Mobility   Outcome Measure  Help needed turning from your back to your side while in a flat bed without using bedrails?: A Little Help needed moving from lying on your back to sitting on the side of a flat bed without using bedrails?: A Lot Help needed moving to and from a bed to a chair (including a wheelchair)?: A Lot Help needed standing up from a chair using your arms (e.g., wheelchair or bedside chair)?: A Lot Help needed to walk in hospital room?: A Lot Help needed climbing 3-5 steps with a railing? : Total 6 Click Score: 12    End of Session Equipment Utilized During Treatment: Gait belt Activity Tolerance: Patient tolerated treatment well Patient left: in bed;with call bell/phone within reach;with bed alarm set Nurse Communication: Mobility status PT Visit Diagnosis: Unsteadiness on feet (R26.81);Difficulty in walking, not elsewhere classified (R26.2);Muscle weakness (generalized) (M62.81)     Time: 9150-5697 PT Time Calculation (min) (ACUTE ONLY): 33 min  Charges:  $Gait Training: 23-37 mins                     Jetta Lout PTA 11/27/20, 1:02 PM

## 2020-11-27 NOTE — Progress Notes (Signed)
Pt sinus bradycardia throughout night, pt asymptomatic.

## 2020-11-28 MED ORDER — TIZANIDINE HCL 2 MG PO TABS
1.0000 mg | ORAL_TABLET | Freq: Three times a day (TID) | ORAL | Status: DC | PRN
  Filled 2020-11-28: qty 0.5

## 2020-11-28 NOTE — Progress Notes (Signed)
Physical Therapy Treatment Patient Details Name: Lisa Williams MRN: [id number] DOB: Jun 21, 1960 Today's Date: 11/28/2020    History of Present Illness Pt is a 60 y/o F admitted on 11/21/20 with c/c of fever, chills, cough & weakness. Pt found to be COVID (+). PMH: MS, HTN, HLD, tobacco abuse, chronic galactorrhea, neurogenic bladder, SVT, chronic back pain    PT Comments    Pt ready and motivated for session.  Participated in exercises as described below.  Session focused on strength and sitting balance/pre-gait activities.  +2 not available for session so standing and gait activities were deferred for pt and staff safety at this time.  Pt remains highly motivated and put in excellent effort for session.  CIR remains appropriate upon discharge.   Follow Up Recommendations  CIR     Equipment Recommendations  None recommended by PT    Recommendations for Other Services       Precautions / Restrictions Precautions Precautions: Fall Precaution Comments: personal BLE AFOs/ PRAFOs Restrictions Weight Bearing Restrictions: No    Mobility  Bed Mobility Overal bed mobility: Needs Assistance Bed Mobility: Supine to Sit;Sit to Supine     Supine to sit: HOB elevated;Min assist Sit to supine: Mod assist;Max assist;HOB elevated        Transfers Overall transfer level: Needs assistance   Transfers: Lateral/Scoot Transfers          Lateral/Scoot Transfers: Min assist General transfer comment: focused on lateral scoot left/right in sitting.  Initially does well but as she fatigues post LOB increases.  She is able to correct and control on her own but min gaurd/assist is required for safety.  Ambulation/Gait                 Stairs             Wheelchair Mobility    Modified Rankin (Stroke Patients Only)       Balance Overall balance assessment: Needs assistance   Sitting balance-Leahy Scale: Good Sitting balance - Comments: static good, dynamic  fair/poor for lateral scoot                                    Cognition Arousal/Alertness: Awake/alert Behavior During Therapy: WFL for tasks assessed/performed Overall Cognitive Status: Within Functional Limits for tasks assessed                                 General Comments: Pt is A and O x 4 and extremely motivated      Exercises Other Exercises Other Exercises: session focused on sitting balance - static and dynamic, seated ex 2 x 10 with red theraband, and supine AAROM x 10    General Comments        Pertinent Vitals/Pain Pain Assessment: No/denies pain    Home Living                      Prior Function            PT Goals (current goals can now be found in the care plan section) Progress towards PT goals: Progressing toward goals    Frequency    7X/week      PT Plan Current plan remains appropriate    Co-evaluation              AM-PAC PT "6  Clicks" Mobility   Outcome Measure  Help needed turning from your back to your side while in a flat bed without using bedrails?: A Little Help needed moving from lying on your back to sitting on the side of a flat bed without using bedrails?: A Lot Help needed moving to and from a bed to a chair (including a wheelchair)?: A Lot Help needed standing up from a chair using your arms (e.g., wheelchair or bedside chair)?: A Lot Help needed to walk in hospital room?: A Lot Help needed climbing 3-5 steps with a railing? : Total 6 Click Score: 12    End of Session   Activity Tolerance: Patient tolerated treatment well Patient left: in bed;with call bell/phone within reach;with bed alarm set Nurse Communication: Mobility status PT Visit Diagnosis: Unsteadiness on feet (R26.81);Difficulty in walking, not elsewhere classified (R26.2);Muscle weakness (generalized) (M62.81)     Time: 0932-3557 PT Time Calculation (min) (ACUTE ONLY): 29 min  Charges:  $Therapeutic Exercise:  8-22 mins $Therapeutic Activity: 8-22 mins                     Danielle Dess, PTA 11/28/20, 4:09 PM , 4:07 PM

## 2020-11-28 NOTE — Progress Notes (Signed)
PROGRESS NOTE  Lisa Williams  OJJ:[id number] DOB: 1960-07-06 DOA: 11/21/2020 PCP: Lauro Regulus, MD  Outpatient Specialists: Neurology, Dr. Anne Hahn Brief Narrative: Lisa Williams is a 60 y.o. female with PMH significant for multiple sclerosis, HTN, HLD, SVT, tobacco abuse, chronic galactorrhea, neurogenic bladder. Patient presented to the ED on 7/17 from home with complaint of fever up to 100.2, chills, cough mostly dry, intermittent chest pain and generalized weakness.   In the ED, patient was afebrile, 89% on room air Labs with white count 6.7 COVID PCR positive Chest x-ray negative for infiltration Admitted to hospitalist service for COVID infection. Neurology was also consulted because of suspicion of worsening multiple sclerosis deficits.  IV steroids were given for 3 days and PT and OT efforts continue.  Assessment & Plan: Principal Problem:   Acute respiratory disease due to COVID-19 virus Active Problems:   MS (multiple sclerosis) (HCC)   Essential hypertension   Neurogenic bladder   Weakness  COVID infection: +PCR 7/17, vaccinated x2. CXR negative. CRP and d-dimer negative.  - Continue isolation x10 days.  - Completed 5 days remdesivir (7/17 - 7/21)  - Of note, pt was told by neurology not to pursue booster as she had severe weakness leading to hospitalization after 2nd inoculation.   Sinus bradycardia: Asymptomatic without prolonged pause or hypotension. No classically offensive medications, though tizanidine may cause bradycardia in overdose (LFTs elevated and drug is hepatically metabolized, so despite low dose (2mg  TID), could feasibly be contributing to acute findings). Remdesivir can cause this at times (has completed course), and siponimod is known to cause bradycardia with 1st dose, but not thereafter. Hasn't withdrawn from anticholinergic oxybutynin. TSH wnl.  - Confirmed by ECG. Telemetry confirmed SB and occasional junctional bradycardia  which has resolved since decreasing tizanidine dosing. Will change tizanidine to prn.   - Continue to avoid negative chronotropes. If persists or becomes symptomatic, would require cardiology evaluation.   Generalized weakness: Suspect MS exacerbation vs. pseudoexacerbation due to covid infection.  - Continue PT/OT. Pt motivated to regain PLOF. - Neurology consulted, recommended MRI for eval of new lesions, though this was declined as the patient requires anesthesia for MRI.   - Received solumedrol 1g daily x3 days (7/19 - 7/21).   Multiple sclerosis:  - Continue patient-supplied home Siponimod  - Continue home baclofen, neurontin  Lymphopenia: Persistent, due to viral infection. No longer neutropenic.   Elevated LFTs: Improving.  - Recheck in AM   Essential hypertension: Well controlled. - Continue ARB. Not on negative chronotropic agent.    Neurogenic bladder - Continue oxybutynin  Obesity: Estimated body mass index is 37.43 kg/m as calculated from the following:   Height as of this encounter: 5\' 11"  (1.803 m).   Weight as of this encounter: 121.7 kg.  DVT prophylaxis: Lovenox Code Status: Full Family Communication: None at bedside Disposition Plan:  Status is: Inpatient  Remains inpatient appropriate because:Unsafe d/c plan and IV treatments appropriate due to intensity of illness or inability to take PO  Dispo: The patient is from: Home              Anticipated d/c is to: CIR - would be eligible after 10 days isolation              Patient currently is not medically stable to d/c.   Difficult to place patient No  Consultants:  Neurology  Procedures:  None  Antimicrobials: Remdesivir   Subjective: HR improving, was able to get up with PT  yesterday but had some lightheadedness toward the end of session. No palpitations or chest pain.   Objective: Vitals:   11/27/20 1553 11/27/20 2127 11/28/20 0521 11/28/20 0805  BP: 140/64 116/61 (!) 116/47 (!) 130/48   Pulse: (!) 45 (!) 55 (!) 51 (!) 51  Resp: 18 16 16 18   Temp: 98 F (36.7 C) 98.5 F (36.9 C) 97.7 F (36.5 C) 98.9 F (37.2 C)  TempSrc: Oral Oral    SpO2: 97% 100% 100% 99%  Weight:      Height:        Intake/Output Summary (Last 24 hours) at 11/28/2020 1258 Last data filed at 11/27/2020 2130 Gross per 24 hour  Intake --  Output 400 ml  Net -400 ml     Filed Weights   11/21/20 0804  Weight: 121.7 kg   Gen: Pleasant female in no distress Pulm: Nonlabored breathing room air. Clear. CV: Regular rate and rhythm. No murmur, rub, or gallop.   GI: Abdomen soft, non-tender, non-distended, with normoactive bowel sounds.  Ext: Warm, no deformities Skin: No new rashes, lesions or ulcers on visualized skin. Neuro: Alert and oriented. Global weakness improving, no new focal neurological deficits. Psych: Judgement and insight appear fair. Mood euthymic & affect congruent. Behavior is appropriate.    Data Reviewed: I have personally reviewed following labs and imaging studies  CBC: Recent Labs  Lab 11/22/20 0437 11/23/20 0511 11/24/20 0601 11/26/20 0647  WBC 3.8* 2.7* 2.3* 8.3  NEUTROABS 2.9 1.5* 2.0 7.5  HGB 12.5 12.9 14.0 13.9  HCT 34.5* 36.3 39.2 39.2  MCV 75.7* 76.4* 74.5* 76.0*  PLT 196 197 220 221   Basic Metabolic Panel: Recent Labs  Lab 11/22/20 0437 11/23/20 0511 11/24/20 0601 11/26/20 0647  NA 138 140 140 142  K 3.6 3.6 3.9 3.9  CL 109 106 105 106  CO2 24 25 24 28   GLUCOSE 103* 92 147* 150*  BUN 14 15 16  35*  CREATININE 0.65 0.56 0.62 0.70  CALCIUM 8.6* 8.6* 9.0 8.9   GFR: Estimated Creatinine Clearance: 109 mL/min (by C-G formula based on SCr of 0.7 mg/dL). Liver Function Tests: Recent Labs  Lab 11/22/20 0437 11/23/20 0511 11/24/20 0601 11/26/20 0647  AST 70* 101* 91* 53*  ALT 104* 127* 129* 111*  ALKPHOS 97 88 102 87  BILITOT 0.7 0.6 0.7 0.6  PROT 6.1* 6.0* 6.3* 6.4*  ALBUMIN 3.2* 3.2* 3.2* 3.2*   Coagulation Profile: No results for  input(s): INR, PROTIME in the last 168 hours.  Urine analysis:    Component Value Date/Time   COLORURINE YELLOW (A) 11/21/2020 0746   APPEARANCEUR HAZY (A) 11/21/2020 0746   APPEARANCEUR Hazy (A) 10/05/2020 1411   LABSPEC 1.024 11/21/2020 0746   LABSPEC 1.030 07/31/2011 1838   PHURINE 7.0 11/21/2020 0746   GLUCOSEU NEGATIVE 11/21/2020 0746   GLUCOSEU Negative 07/31/2011 1838   HGBUR NEGATIVE 11/21/2020 0746   BILIRUBINUR NEGATIVE 11/21/2020 0746   BILIRUBINUR Negative 10/05/2020 1411   BILIRUBINUR Negative 07/31/2011 1838   KETONESUR NEGATIVE 11/21/2020 0746   PROTEINUR NEGATIVE 11/21/2020 0746   NITRITE NEGATIVE 11/21/2020 0746   LEUKOCYTESUR NEGATIVE 11/21/2020 0746   LEUKOCYTESUR Negative 07/31/2011 1838   Recent Results (from the past 240 hour(s))  Resp Panel by RT-PCR (Flu A&B, Covid) Nasopharyngeal Swab     Status: Abnormal   Collection Time: 11/21/20  7:46 AM   Specimen: Nasopharyngeal Swab; Nasopharyngeal(NP) swabs in vial transport medium  Result Value Ref Range Status   SARS Coronavirus 2  by RT PCR POSITIVE (A) NEGATIVE Final    Comment: RESULT CALLED TO, READ BACK BY AND VERIFIED WITH: ASHLEY MURRAY AT 0911 11/21/20.PMF (NOTE) SARS-CoV-2 target nucleic acids are DETECTED.  The SARS-CoV-2 RNA is generally detectable in upper respiratory specimens during the acute phase of infection. Positive results are indicative of the presence of the identified virus, but do not rule out bacterial infection or co-infection with other pathogens not detected by the test. Clinical correlation with patient history and other diagnostic information is necessary to determine patient infection status. The expected result is Negative.  Fact Sheet for Patients: BloggerCourse.com  Fact Sheet for Healthcare Providers: SeriousBroker.it  This test is not yet approved or cleared by the [place] FDA and  has been authorized for  detection and/or diagnosis of SARS-CoV-2 by FDA under an Emergency Use Authorization (EUA).  This EUA will remain in effect (meaning this test can be  used) for the duration of  the COVID-19 declaration under Section 564(b)(1) of the Act, 21 U.S.C. section 360bbb-3(b)(1), unless the authorization is terminated or revoked sooner.     Influenza A by PCR NEGATIVE NEGATIVE Final   Influenza B by PCR NEGATIVE NEGATIVE Final    Comment: (NOTE) The Xpert Xpress SARS-CoV-2/FLU/RSV plus assay is intended as an aid in the diagnosis of influenza from Nasopharyngeal swab specimens and should not be used as a sole basis for treatment. Nasal washings and aspirates are unacceptable for Xpert Xpress SARS-CoV-2/FLU/RSV testing.  Fact Sheet for Patients: BloggerCourse.com  Fact Sheet for Healthcare Providers: SeriousBroker.it  This test is not yet approved or cleared by the [place] FDA and has been authorized for detection and/or diagnosis of SARS-CoV-2 by FDA under an Emergency Use Authorization (EUA). This EUA will remain in effect (meaning this test can be used) for the duration of the COVID-19 declaration under Section 564(b)(1) of the Act, 21 U.S.C. section 360bbb-3(b)(1), unless the authorization is terminated or revoked.  Performed at [facility], [address], [place], [place] [address]   Urine Culture     Status: Abnormal   Collection Time: 11/21/20  7:46 AM   Specimen: Urine, Random  Result Value Ref Range Status   Specimen Description   Final    URINE, RANDOM Performed at [facility], [address] [place], [place] [address]    Special Requests   Final    NONE Performed at [facility], [address] [place], [place] [address]    Culture (A)  Final    10,000 COLONIES/mL MULTIPLE SPECIES PRESENT, SUGGEST RECOLLECTION   Report Status 11/22/2020 FINAL  Final  Blood Culture (routine x 2)      Status: None   Collection Time: 11/21/20  7:56 AM   Specimen: BLOOD  Result Value Ref Range Status   Specimen Description BLOOD RIGHT HAND  Final   Special Requests   Final    BOTTLES DRAWN AEROBIC AND ANAEROBIC Blood Culture adequate volume   Culture   Final    NO GROWTH 5 DAYS Performed at [facility], [address], [place], [place] [address]    Report Status 11/26/2020 FINAL  Final  Blood Culture (routine x 2)     Status: None   Collection Time: 11/21/20  7:56 AM   Specimen: BLOOD  Result Value Ref Range Status   Specimen Description BLOOD RIGHT [facility]  Final   Special Requests   Final    BOTTLES DRAWN AEROBIC AND ANAEROBIC Blood Culture adequate volume  Culture   Final    NO GROWTH 5 DAYS Performed at [facility], [address] [place] [place], [place] [address]    Report Status 11/26/2020 FINAL  Final      Radiology Studies: No results found.  Scheduled Meds:  baclofen  20 mg Oral TID   benzonatate  200 mg Oral TID   cholecalciferol  2,000 Units Oral Daily   enoxaparin (LOVENOX) injection  0.5 mg/kg Subcutaneous Q24H   gabapentin  100 mg Oral BID   irbesartan  150 mg Oral Daily   oxybutynin  10 mg Oral Daily   pantoprazole  40 mg Oral BID   Siponimod Fumarate  2 mg Oral Daily   Continuous Infusions:     LOS: 7 days   Time spent: 25 minutes.  Tyrone Nine, MD [facility] [email or web address] 11/28/2020, 12:58 PM

## 2020-11-29 LAB — COMPREHENSIVE METABOLIC PANEL
ALT: 69 U/L — ABNORMAL HIGH (ref 0–44)
AST: 32 U/L (ref 15–41)
Albumin: 2.9 g/dL — ABNORMAL LOW (ref 3.5–5.0)
Alkaline Phosphatase: 76 U/L (ref 38–126)
Anion gap: 5 (ref 5–15)
BUN: 33 mg/dL — ABNORMAL HIGH (ref 6–20)
CO2: 28 mmol/L (ref 22–32)
Calcium: 8.1 mg/dL — ABNORMAL LOW (ref 8.9–10.3)
Chloride: 108 mmol/L (ref 98–111)
Creatinine, Ser: 0.74 mg/dL (ref 0.44–1.00)
GFR, Estimated: >60 mL/min (ref >60)
Glucose, Bld: 110 mg/dL — ABNORMAL HIGH (ref 70–99)
Potassium: 3.7 mmol/L (ref 3.5–5.1)
Sodium: 141 mmol/L (ref 135–145)
Total Bilirubin: 0.9 mg/dL (ref 0.3–1.2)
Total Protein: 5.5 g/dL — ABNORMAL LOW (ref 6.5–8.1)

## 2020-11-29 NOTE — Progress Notes (Signed)
PROGRESS NOTE  Lisa Williams  DOB: 04-26-1961  PCP: Lauro Regulus, MD VAN:[id number]  DOA: 11/21/2020  LOS: 8 days  Hospital Day: 9   Chief Complaint  Patient presents with   Weakness    Brief narrative: Lisa Williams is a 60 y.o. female with PMH significant for multiple sclerosis, HTN, HLD, SVT, tobacco abuse, chronic galactorrhea, neurogenic bladder. Patient presented to the ED on 7/17 from home with complaint of fever up to 100.2, chills, cough mostly dry, intermittent chest pain and generalized weakness.   In the ED, patient was afebrile, 89% on room air Labs with white count 6.7 COVID PCR positive Chest x-ray negative for infiltration Admitted to hospitalist service for COVID infection. Neurology was also consulted because of suspicion of worsening multiple sclerosis deficits Patient received IV steroids for 3 days after which her weakness is gradually improving.  Subjective: Patient was seen and examined this morning.   Sitting up in bed.  Not in distress.  Cough improving.  Seems to be gradually improving in terms of physical strength.  Pending CIR placement  Assessment/Plan: COVID infection -Presented with fever, chest pain, dry cough -COVID test: PCR positive in the ED -Chest imaging: No infiltrates -Treatment: Currently on a 5-day course of IV remdesivir, bronchodilators, Mucinex. -Progression: Not on supplemental oxygen -Procalcitonin level negative. Not on antibiotics  -Supportive care: Vitamin C, Zinc, PRN inhalers, Tylenol, Antitussives (benzonatate/ Mucinex/Tussionex).   -Encouraged incentive spirometry, prone position, out of bed and early mobilization as much as possible -WBC and inflammatory markers trend as below. Recent Labs  Lab 11/23/20 0511 11/24/20 0601 11/26/20 0647 11/29/20 0436  WBC 2.7* 2.3* 8.3  --   CRP 0.9 0.8  --   --   ALT 127* 129* 111* 69*   Generalized weakness in the setting of multiple sclerosis   -Generalized weakness is likely because of MS exacerbation due to infection.   -Neurology consulted.  Recommend MRI brain and C-spine.  However patient has claustrophobia and is refusing to get MRI.  She hopes to get stronger with treatment of COVID.  -Patient was given a 3-day course of IV Solu-Medrol 1 g after which she feels some improvement in her physical strength. -continue home Siponimod Fumarate (ok to let pt take her own meds) -continue home baclofen, Neurontin -Tizanidine was held because of bradycardia.  Sinus bradycardia -Intermittent asymptomatic without prolonged pause or hypotension.  Tizanidine on hold.   Essential hypertension -Continue to monitor blood pressure on irbesartan -IV hydralazine as needed   Neurogenic bladder -Continue oxybutynin   Mobility: Encourage ambulation with PT Code Status:   Code Status: Full Code  Nutritional status: Body mass index is 37.43 kg/m.     Diet:  Diet Order             Diet Heart Room service appropriate? Yes; Fluid consistency: Thin  Diet effective now                  DVT prophylaxis: Lovenox subcu    Antimicrobials: Completed course of remdesivir Fluid: None Consultants: None Family Communication: None at bedside  Status is: Inpatient  Remains inpatient appropriate because: Pending CIR  Dispo: The patient is from: Home              Anticipated d/c is to: CIR recommended by PT              Patient currently is medically stable to d/c.   Difficult to place patient No  Infusions:     Scheduled Meds:  baclofen  20 mg Oral TID   benzonatate  200 mg Oral TID   cholecalciferol  2,000 Units Oral Daily   enoxaparin (LOVENOX) injection  0.5 mg/kg Subcutaneous Q24H   gabapentin  100 mg Oral BID   irbesartan  150 mg Oral Daily   oxybutynin  10 mg Oral Daily   pantoprazole  40 mg Oral BID   Siponimod Fumarate  2 mg Oral Daily    Antimicrobials: Anti-infectives (From admission, onward)    Start      Dose/Rate Route Frequency Ordered Stop   11/22/20 1000  remdesivir 100 mg in sodium chloride 0.9 % 100 mL IVPB       See Hyperspace for full Linked Orders Report.   100 mg 200 mL/hr over 30 Minutes Intravenous Daily 11/21/20 0957 11/25/20 1025   11/21/20 1100  remdesivir 200 mg in sodium chloride 0.9% 250 mL IVPB       See Hyperspace for full Linked Orders Report.   200 mg 580 mL/hr over 30 Minutes Intravenous Once 11/21/20 0957 11/21/20 1154   11/21/20 0800  cefTRIAXone (ROCEPHIN) 2 g in sodium chloride 0.9 % 100 mL IVPB  Status:  Discontinued        2 g 200 mL/hr over 30 Minutes Intravenous Every 24 hours 11/21/20 0747 11/21/20 1027   11/21/20 0800  azithromycin (ZITHROMAX) 500 mg in sodium chloride 0.9 % 250 mL IVPB  Status:  Discontinued        500 mg 250 mL/hr over 60 Minutes Intravenous Every 24 hours 11/21/20 0747 11/21/20 1027       PRN meds: acetaminophen, albuterol, guaiFENesin-dextromethorphan, hydrALAZINE, ondansetron (ZOFRAN) IV, tiZANidine   Objective: Vitals:   11/29/20 0504 11/29/20 0845  BP: (!) 131/55 (!) 109/50  Pulse: (!) 58 (!) 58  Resp: 18 17  Temp: 97.8 F (36.6 C) 97.8 F (36.6 C)  SpO2: 99% 100%    Intake/Output Summary (Last 24 hours) at 11/29/2020 1556 Last data filed at 11/28/2020 2001 Gross per 24 hour  Intake --  Output 550 ml  Net -550 ml   Filed Weights   11/21/20 0804  Weight: 121.7 kg   Weight change:  Body mass index is 37.43 kg/m.   Physical Exam: General exam: Pleasant, middle-aged African-American female.  Sitting up in bed.  Not in distress  skin: No rashes, lesions or ulcers. HEENT: Atraumatic, normocephalic, no obvious bleeding Lungs: Clear to auscultation bilaterally CVS: Regular rate and rhythm, no murmur GI/Abd soft, nontender, nondistended, bowel sound present CNS: Alert, awake, oriented x3, at baseline, she has deficits in the lower extremities because of multiple sclerosis Psychiatry: Mood  appropriate Extremities: No pedal edema, no calf tenderness  Data Review: I have personally reviewed the laboratory data and studies available.  Recent Labs  Lab 11/23/20 0511 11/24/20 0601 11/26/20 0647  WBC 2.7* 2.3* 8.3  NEUTROABS 1.5* 2.0 7.5  HGB 12.9 14.0 13.9  HCT 36.3 39.2 39.2  MCV 76.4* 74.5* 76.0*  PLT 197 220 221   Recent Labs  Lab 11/23/20 0511 11/24/20 0601 11/26/20 0647 11/29/20 0436  NA 140 140 142 141  K 3.6 3.9 3.9 3.7  CL 106 105 106 108  CO2 25 24 28 28   GLUCOSE 92 147* 150* 110*  BUN 15 16 35* 33*  CREATININE 0.56 0.62 0.70 0.74  CALCIUM 8.6* 9.0 8.9 8.1*    F/u labs ordered Unresulted Labs (From admission, onward)    None  Signed, Lorin Glass, MD [facility] 11/29/2020

## 2020-11-29 NOTE — Progress Notes (Signed)
Occupational Therapy Treatment Patient Details Name: Lisa Williams MRN: [id number] DOB: 10-14-1960 Today's Date: 11/29/2020    History of present illness Pt is a 60 y/o F admitted on 11/21/20 with c/c of fever, chills, cough & weakness. Pt found to be COVID (+). PMH: MS, HTN, HLD, tobacco abuse, chronic galactorrhea, neurogenic bladder, SVT, chronic back pain   OT comments  Pt seen for OT treatment on this date; OT/PT co-treat d/t pt requiring +2 assistance. Upon arrival to room, pt awake and extremely motivated to participate in OOB mobility. Pt is progressing well, and this date required only MIN GUARD to complete supine>sit transfer with HOB elevated, bedrail use, and increased time/effort. With MIN GUARD sitting EOB, pt able to bring b/l ankles over knee into crossed leg position when donning AFO and shoes, requiring only MIN physical assist to place L ankle in AFO and to tie shoes. Pt attempted x4 sit<>stand transfers with MAX A+2 and b/l UE on RW, however pt unable to maintain static standing balance for >5sec. Pt motivated to work with therapy despite being unable to progress functional mobility this date, and engaged in seated UB/LB therapy exercises with theraband (see PT note) while sitting EOB. Pt assisted back to supine, requiring MOD A for sit>supine transfer and MAX A to don PRAFO at bedlevel. Pt continues present with decreased balance and strength, and continues to benefit from skilled OT services to maximize return to PLOF and minimize risk of future falls, injury, caregiver burden, and readmission. Will continue to follow POC. Discharge recommendation remains appropriate.     Follow Up Recommendations  CIR    Equipment Recommendations  Other (comment) (pt able to bring b/l ankles over knee into crossed leg position when donning AFO and shoes)       Precautions / Restrictions Precautions Precautions: Fall Precaution Comments: personal BLE AFOs/  PRAFOs Restrictions Weight Bearing Restrictions: No       Mobility Bed Mobility Overal bed mobility: Needs Assistance Bed Mobility: Supine to Sit;Sit to Supine     Supine to sit: Min guard;HOB elevated Sit to supine: Mod assist   General bed mobility comments: With use of bedrails + HOB elevated, pt able to perform supine>sit transfer with increase time/effort. Pt required MOD A for LE management during sit>supine    Transfers Overall transfer level: Needs assistance Equipment used: Rolling walker (2 wheeled) Transfers: Sit to/from Stand Sit to Stand: Max assist;+2 physical assistance;From elevated surface        Lateral/Scoot Transfers: Min guard General transfer comment: MAX A +2 for x4 sit<>stand bouts, with pt reporting unable to maintain standing balance for >5 sec    Balance Overall balance assessment: Needs assistance Sitting-balance support: Feet supported;Bilateral upper extremity supported Sitting balance-Leahy Scale: Good Sitting balance - Comments: static good, dynamic fair/poor during seated therex   Standing balance support: Bilateral upper extremity supported;During functional activity Standing balance-Leahy Scale: Zero Standing balance comment: Unable to maintain standing balance for >5sec with knees blocked and pt receiving MAX A+2                           ADL either performed or assessed with clinical judgement   ADL Overall ADL's : Needs assistance/impaired                     Lower Body Dressing: Minimal assistance;Sitting/lateral leans;Maximal assistance Lower Body Dressing Details (indicate cue type and reason): With MIN GUARD sitting EOB, pt  able to bring b/l ankles over knee into crossed leg position when donning AFO and shoes, requiring only MIN physical assist to place L ankle in AFO and to tie shoes. MAX A to don PRAFO at bedlevel             Functional mobility during ADLs: +2 for physical assistance;Rolling  walker;Maximal assistance        Cognition Arousal/Alertness: Awake/alert Behavior During Therapy: WFL for tasks assessed/performed Overall Cognitive Status: Within Functional Limits for tasks assessed                                 General Comments: A&Ox4. Pt is extremely motivated and is able to recall and demo outpatient PT exercises                   Pertinent Vitals/ Pain       Pain Assessment: No/denies pain         Frequency  Min 3X/week        Progress Toward Goals  OT Goals(current goals can now be found in the care plan section)  Progress towards OT goals: Progressing toward goals  Acute Rehab OT Goals Patient Stated Goal: return to PLOF OT Goal Formulation: With patient Time For Goal Achievement: 12/06/20 Potential to Achieve Goals: Good  Plan Discharge plan remains appropriate;Frequency remains appropriate    Co-evaluation    PT/OT/SLP Co-Evaluation/Treatment: Yes Reason for Co-Treatment: For patient/therapist safety;To address functional/ADL transfers PT goals addressed during session: Mobility/safety with mobility;Balance OT goals addressed during session: ADL's and self-care      AM-PAC OT "6 Clicks" Daily Activity     Outcome Measure   Help from another person eating meals?: None Help from another person taking care of personal grooming?: A Little Help from another person toileting, which includes using toliet, bedpan, or urinal?: A Lot Help from another person bathing (including washing, rinsing, drying)?: A Lot Help from another person to put on and taking off regular upper body clothing?: A Little Help from another person to put on and taking off regular lower body clothing?: A Lot 6 Click Score: 16    End of Session Equipment Utilized During Treatment: Rolling walker;Gait belt  OT Visit Diagnosis: Other abnormalities of gait and mobility (R26.89);Muscle weakness (generalized) (M62.81)   Activity Tolerance Patient  tolerated treatment well   Patient Left in bed;with call bell/phone within reach;with bed alarm set   Nurse Communication Mobility status        Time: 1400-1440 OT Time Calculation (min): 40 min  Charges: OT General Charges $OT Visit: 1 Visit OT Treatments $Self Care/Home Management : 8-22 mins  Matthew Folks, OTR/L [facility] [phone]

## 2020-11-29 NOTE — Progress Notes (Signed)
Physical Therapy Treatment Patient Details Name: Lisa Williams MRN: [id number] DOB: Jun 09, 1960 Today's Date: 11/29/2020    History of Present Illness Pt is a 61 y/o F admitted on 11/21/20 with c/c of fever, chills, cough & weakness. Pt found to be COVID (+). PMH: MS, HTN, HLD, tobacco abuse, chronic galactorrhea, neurogenic bladder, SVT, chronic back pain    PT Comments    Pt is making gradual progress towards goals with ability to stand multiple reps this date, however was not comfortable ambulating at this time. Reports being very hot (AC adjusted). Quick fatigue during standing with decreased eccentric control. Good endurance and recall on seated there-ex. Still remains qualified for CIR. PT/OT co treat at this time due to insufficient staffing to coordinate 2 separate treatments with covid isolation staus. Will continue to progress.   Follow Up Recommendations  CIR     Equipment Recommendations  None recommended by PT    Recommendations for Other Services       Precautions / Restrictions Precautions Precautions: Fall Precaution Comments: personal BLE AFOs/ PRAFOs Restrictions Weight Bearing Restrictions: No    Mobility  Bed Mobility Overal bed mobility: Needs Assistance Bed Mobility: Supine to Sit;Sit to Supine     Supine to sit: Min guard;HOB elevated Sit to supine: Mod assist   General bed mobility comments: able to guide B LE with use of hands. Once seated at EOB, able to maintain balance. Upon return supine, needs mod assist for B LE. Pt able to reposition in bed with supervision and use of rails    Transfers Overall transfer level: Needs assistance Equipment used: Rolling walker (2 wheeled) Transfers: Sit to/from Stand Sit to Stand: Max assist;+2 physical assistance;From elevated surface        Lateral/Scoot Transfers: Min guard General transfer comment: able to successfully stand 4-5 times with +2 assist. Bed elevated. Pt required B knee blocked  due to quad insufficiency. Also able to perform lateral scoot at bedside up towards HOB.  Ambulation/Gait             General Gait Details: unable   Stairs             Wheelchair Mobility    Modified Rankin (Stroke Patients Only)       Balance Overall balance assessment: Needs assistance Sitting-balance support: Feet supported;Bilateral upper extremity supported Sitting balance-Leahy Scale: Good Sitting balance - Comments: static good, dynamic fair/poor during seated therex   Standing balance support: Bilateral upper extremity supported;During functional activity Standing balance-Leahy Scale: Poor Standing balance comment: Unable to maintain standing balance for >5sec with knees blocked and pt receiving MAX A+2                            Cognition Arousal/Alertness: Awake/alert Behavior During Therapy: WFL for tasks assessed/performed Overall Cognitive Status: Within Functional Limits for tasks assessed                                 General Comments: A&Ox4. Pt is extremely motivated and is able to recall and demo outpatient PT exercises      Exercises Other Exercises Other Exercises: seated ther-ex including B LE LAQ (legs unweighted), resisted theraband hip exercises, and core crunches. 10 reps performed. Also B UE scap squeeze and post shoulder. Other Exercises: able to don B LE AFO while seated and this therapist donned PRAFO once supine in bed.  General Comments        Pertinent Vitals/Pain Pain Assessment: No/denies pain    Home Living                      Prior Function            PT Goals (current goals can now be found in the care plan section) Acute Rehab PT Goals Patient Stated Goal: return to PLOF PT Goal Formulation: With patient Time For Goal Achievement: 12/06/20 Potential to Achieve Goals: Good Progress towards PT goals: Progressing toward goals    Frequency    7X/week      PT Plan  Current plan remains appropriate    Co-evaluation PT/OT/SLP Co-Evaluation/Treatment: Yes Reason for Co-Treatment: For patient/therapist safety;To address functional/ADL transfers PT goals addressed during session: Mobility/safety with mobility;Balance OT goals addressed during session: ADL's and self-care      AM-PAC PT "6 Clicks" Mobility   Outcome Measure  Help needed turning from your back to your side while in a flat bed without using bedrails?: A Little Help needed moving from lying on your back to sitting on the side of a flat bed without using bedrails?: A Lot Help needed moving to and from a bed to a chair (including a wheelchair)?: A Lot Help needed standing up from a chair using your arms (e.g., wheelchair or bedside chair)?: A Lot Help needed to walk in hospital room?: A Lot Help needed climbing 3-5 steps with a railing? : Total 6 Click Score: 12    End of Session Equipment Utilized During Treatment: Gait belt Activity Tolerance: Patient tolerated treatment well Patient left: in bed;with call bell/phone within reach;with bed alarm set Nurse Communication: Mobility status PT Visit Diagnosis: Unsteadiness on feet (R26.81);Difficulty in walking, not elsewhere classified (R26.2);Muscle weakness (generalized) (M62.81)     Time: 1357-1440 PT Time Calculation (min) (ACUTE ONLY): 43 min  Charges:  $Therapeutic Exercise: 23-37 mins                     Elizabeth Palau, [place], DPT [phone]    Davanta Meuser 11/29/2020, 4:56 PM

## 2020-11-29 NOTE — TOC Progression Note (Signed)
Transition of Care [facility]) - Progression Note    Patient Details  Name: Lisa Williams MRN: [id number] Date of Birth: Mar 18, 1961  [facility] Care [facility]) CM/SW Contact  Chapman Fitch, RN Phone Number: 11/29/2020, 2:50 PM  Clinical Narrative:    PT recommending CIR CIR admissions coordinator has discussed with patient and patient would like to pursue CIR Patient tested positive for covid on 7/17.  Would have to complete 10 day isolation prior to coming to CIR, which would be 7/27   Expected Discharge Plan: IP Rehab Facility Barriers to Discharge: Continued Medical Work up  Expected Discharge Plan and Services Expected Discharge Plan: IP Rehab Facility     Post Acute Care Choice: IP Rehab Living arrangements for the past 2 months: Single Family Home                                       Social Determinants of Health (SDOH) Interventions    Readmission Risk Interventions No flowsheet data found.

## 2020-11-29 NOTE — Progress Notes (Signed)
Inpatient Rehab Admissions Coordinator:   I spoke with pt. Regarding potential CIR admit. She is interested and states that her mother can provide 24/7 support at d/c. I will open a case with [employer] for potential admit later this week once COVID precautions have lifted.   Megan Salon, MS, CCC-SLP Rehab Admissions Coordinator  [phone] (celll) [phone] (office)

## 2020-11-30 ENCOUNTER — Ambulatory Visit: Payer: PPO

## 2020-11-30 NOTE — Progress Notes (Signed)
Physical Therapy Treatment Patient Details Name: Lisa Williams MRN: [id number] DOB: [birth year] Today's Date: 11/30/2020    History of Present Illness Pt is a 60 y/o F admitted on 11/21/20 with c/c of fever, chills, cough & weakness. Pt found to be COVID (+). PMH: MS, HTN, HLD, tobacco abuse, chronic galactorrhea, neurogenic bladder, SVT, chronic back pain    PT Comments    Pt was long sitting in bed with supportive sister at bedside. She agrees to PT session and continues to be extremely motivated and pleasant. " I'm not having a good day but I'm ready to do it." Pt was able to exit R side of bed with HOB elevated and heavy use of bed rail +  min assist. Increased difficulty to advance LEs in bed and in standing today. She did perform STS 5 x throughout session with bed height slightly elevated and max assist to prevent knee buckling. Unable to clear extremities to advance to taking steps away from bed. Pt has not ambulated since 11/27/20. Pt does get slightly frustrated but with education was understanding. Continues to require mod assist to progress BLEs back into bed post standing trials. PRAFOs applied to prevent ankle contractures once repositioned in bed. LE weakness/MS limits pt's progress towards PT goals. Highly recommend DC to CIR to address deficits while assisting pt to PLOF. Pt was ambulatory prior to admission.    Follow Up Recommendations  CIR     Equipment Recommendations  None recommended by PT       Precautions / Restrictions Precautions Precautions: Fall Precaution Comments: personal BLE AFOs/ PRAFOs Restrictions Weight Bearing Restrictions: No    Mobility  Bed Mobility Overal bed mobility: Needs Assistance Bed Mobility: Supine to Sit;Sit to Supine     Supine to sit: HOB elevated;Min guard;Min assist Sit to supine: Mod assist   General bed mobility comments: pt required slightly more assistance to exit bed. HOB was elevated and pt used bed rails. mod  assist to progress BLEs into bed after OOB activity    Transfers Overall transfer level: Needs assistance Equipment used: Rolling walker (2 wheeled) Transfers: Sit to/from Stand Sit to Stand: From elevated surface;Max assist         General transfer comment: Pt was able to perform STS EOB (elevated ) 5 x throughout session. prolonged rest between trials. Pt struggles with knee extension upon standing requiring max assist to prevent buckling. pt did start with 1 UE on RW and one pushing from bed. Vcs for fwd wt shift. BLE AFOs applied prior to standing. pt has more difficulty clearing LEs from floor. unable/unsafe to progress away from EOB. MD i room during session.  Ambulation/Gait      General Gait Details: unable today 2/2 to increased difficulty clearing LEs. Pt fatigued quickly today.    Balance Overall balance assessment: Needs assistance Sitting-balance support: Feet supported;Bilateral upper extremity supported Sitting balance-Leahy Scale: Good     Standing balance support: Bilateral upper extremity supported;During functional activity Standing balance-Leahy Scale: Poor Standing balance comment: completely reliant on BUE support due to weakness in BLEs. attempted to side step from FOB to [facility] however unable today 2/2 to fatigue/weakness         Cognition Arousal/Alertness: Awake/alert Behavior During Therapy: WFL for tasks assessed/performed Overall Cognitive Status: Within Functional Limits for tasks assessed          General Comments: A&Ox4. Pt is extremely motivated             Pertinent Vitals/Pain  Pain Assessment: No/denies pain     PT Goals (current goals can now be found in the care plan section) Acute Rehab PT Goals Patient Stated Goal: return to PLOF Progress towards PT goals: Progressing toward goals    Frequency    7X/week      PT Plan Current plan remains appropriate    Co-evaluation     PT goals addressed during session:  Mobility/safety with mobility;Strengthening/ROM;Balance        AM-PAC PT "6 Clicks" Mobility   Outcome Measure  Help needed turning from your back to your side while in a flat bed without using bedrails?: A Little Help needed moving from lying on your back to sitting on the side of a flat bed without using bedrails?: A Lot Help needed moving to and from a bed to a chair (including a wheelchair)?: A Lot Help needed standing up from a chair using your arms (e.g., wheelchair or bedside chair)?: A Lot Help needed to walk in hospital room?: A Lot Help needed climbing 3-5 steps with a railing? : Total 6 Click Score: 12    End of Session Equipment Utilized During Treatment: Gait belt Activity Tolerance: Patient tolerated treatment well Patient left: in bed;with call bell/phone within reach;with bed alarm set Nurse Communication: Mobility status PT Visit Diagnosis: Unsteadiness on feet (R26.81);Difficulty in walking, not elsewhere classified (R26.2);Muscle weakness (generalized) (M62.81)     Time: 2992-4268 PT Time Calculation (min) (ACUTE ONLY): 45 min  Charges:  $Therapeutic Exercise: 8-22 mins $Therapeutic Activity: 23-37 mins                     Jetta Lout PTA 11/30/20, 12:24 PM

## 2020-11-30 NOTE — Progress Notes (Signed)
PROGRESS NOTE  Lisa Williams  DOB: [birth year]  PCP: Lauro Regulus, MD [id number]  DOA: 11/21/2020  LOS: 9 days  Hospital Day: 10   Chief Complaint  Patient presents with   Weakness    Brief narrative: Lisa Williams is a 60 y.o. female with PMH significant for multiple sclerosis, HTN, HLD, SVT, tobacco abuse, chronic galactorrhea, neurogenic bladder. Patient presented to the ED on 7/17 from home with complaint of fever up to 100.2, chills, cough mostly dry, intermittent chest pain and generalized weakness.   In the ED, patient was afebrile, 89% on room air Labs with white count 6.7 COVID PCR positive Chest x-ray negative for infiltration Admitted to hospitalist service for COVID infection. Neurology was also consulted because of suspicion of worsening multiple sclerosis deficits Patient received IV steroids for 3 days after which her weakness is gradually improving.  Subjective: Patient was seen and examined this morning.   Middle-aged African-American female.  Sitting up at the edge of the bed.  Working with physical therapy.  Cough improving.  Pending CIR placement.  Assessment/Plan: COVID infection -Presented with fever, chest pain, dry cough -COVID test: PCR positive in the ED -Chest imaging: No infiltrates -Treatment: Completed 5-day course of IV remdesivir, bronchodilators, Mucinex. -Progression: Not on supplemental oxygen -Procalcitonin level negative. Not on antibiotics  -Supportive care: Vitamin C, Zinc, PRN inhalers, Tylenol, Antitussives (benzonatate/ Mucinex/Tussionex).   -Encouraged incentive spirometry, prone position, out of bed and early mobilization as much as possible -WBC and inflammatory markers trend as below. Recent Labs  Lab 11/24/20 0601 11/26/20 0647 11/29/20 0436  WBC 2.3* 8.3  --   CRP 0.8  --   --   ALT 129* 111* 69*    Generalized weakness in the setting of multiple sclerosis  -Generalized weakness is  likely because of MS exacerbation due to infection.   -Neurology consulted.  Recommend MRI brain and C-spine.  However patient has claustrophobia and is refusing to get MRI.  She hopes to get stronger with treatment of COVID.  -Patient was given a 3-day course of IV Solu-Medrol 1 g after which she felt some improvement in her physical strength. -continue home Siponimod Fumarate (ok to let pt take her own meds) -continue home baclofen, Neurontin -Tizanidine was held because of bradycardia.  Sinus bradycardia -Intermittent asymptomatic without prolonged pause or hypotension.  Tizanidine on hold.   Essential hypertension -Continue to monitor blood pressure on irbesartan -IV hydralazine as needed   Neurogenic bladder -Continue oxybutynin   Mobility: Encourage ambulation with PT Code Status:   Code Status: Full Code  Nutritional status: Body mass index is 37.43 kg/m.     Diet:  Diet Order             Diet Heart Room service appropriate? Yes; Fluid consistency: Thin  Diet effective now                  DVT prophylaxis: Lovenox subcu    Antimicrobials: Completed course of remdesivir Fluid: None Consultants: None Family Communication: None at bedside  Status is: Inpatient  Remains inpatient appropriate because: Pending CIR  Dispo: The patient is from: Home              Anticipated d/c is to: CIR when accepted              Patient currently is medically stable to d/c.   Difficult to place patient No     Infusions:     Scheduled Meds:  baclofen  20 mg Oral TID   benzonatate  200 mg Oral TID   cholecalciferol  2,000 Units Oral Daily   enoxaparin (LOVENOX) injection  0.5 mg/kg Subcutaneous Q24H   gabapentin  100 mg Oral BID   irbesartan  150 mg Oral Daily   oxybutynin  10 mg Oral Daily   pantoprazole  40 mg Oral BID   Siponimod Fumarate  2 mg Oral Daily    Antimicrobials: Anti-infectives (From admission, onward)    Start     Dose/Rate Route Frequency  Ordered Stop   11/22/20 1000  remdesivir 100 mg in sodium chloride 0.9 % 100 mL IVPB       See Hyperspace for full Linked Orders Report.   100 mg 200 mL/hr over 30 Minutes Intravenous Daily 11/21/20 0957 11/25/20 1025   11/21/20 1100  remdesivir 200 mg in sodium chloride 0.9% 250 mL IVPB       See Hyperspace for full Linked Orders Report.   200 mg 580 mL/hr over 30 Minutes Intravenous Once 11/21/20 0957 11/21/20 1154   11/21/20 0800  cefTRIAXone (ROCEPHIN) 2 g in sodium chloride 0.9 % 100 mL IVPB  Status:  Discontinued        2 g 200 mL/hr over 30 Minutes Intravenous Every 24 hours 11/21/20 0747 11/21/20 1027   11/21/20 0800  azithromycin (ZITHROMAX) 500 mg in sodium chloride 0.9 % 250 mL IVPB  Status:  Discontinued        500 mg 250 mL/hr over 60 Minutes Intravenous Every 24 hours 11/21/20 0747 11/21/20 1027       PRN meds: acetaminophen, albuterol, guaiFENesin-dextromethorphan, hydrALAZINE, ondansetron (ZOFRAN) IV, tiZANidine   Objective: Vitals:   11/30/20 0528 11/30/20 0810  BP: (!) 109/48 (!) 114/48  Pulse: (!) 55 (!) 56  Resp: 18 18  Temp: 98.4 F (36.9 C) 98.3 F (36.8 C)  SpO2: 99% 99%   No intake or output data in the 24 hours ending 11/30/20 1223  Filed Weights   11/21/20 0804  Weight: 121.7 kg   Weight change:  Body mass index is 37.43 kg/m.   Physical Exam: General exam: Pleasant, middle-aged African-American female.  Sitting up in bed.  Not in distress  skin: No rashes, lesions or ulcers. HEENT: Atraumatic, normocephalic, no obvious bleeding Lungs: Clear to auscultation bilaterally CVS: Regular rate and rhythm, no murmur GI/Abd soft, nontender, nondistended, bowel sound present CNS: Alert, awake, oriented x3, at baseline, she has deficits in the lower extremities because of multiple sclerosis Psychiatry: Mood appropriate Extremities: No pedal edema, no calf tenderness  Data Review: I have personally reviewed the laboratory data and studies  available.  Recent Labs  Lab 11/24/20 0601 11/26/20 0647  WBC 2.3* 8.3  NEUTROABS 2.0 7.5  HGB 14.0 13.9  HCT 39.2 39.2  MCV 74.5* 76.0*  PLT 220 221    Recent Labs  Lab 11/24/20 0601 11/26/20 0647 11/29/20 0436  NA 140 142 141  K 3.9 3.9 3.7  CL 105 106 108  CO2 24 28 28   GLUCOSE 147* 150* 110*  BUN 16 35* 33*  CREATININE 0.62 0.70 0.74  CALCIUM 9.0 8.9 8.1*     F/u labs ordered Unresulted Labs (From admission, onward)    None       Signed, , MD [facility] 11/30/2020

## 2020-11-30 NOTE — Progress Notes (Signed)
Inpatient Rehab Admissions Coordinator:   Pt. To come off COVID isolation tomorrow. I will open a case with there insurance for potential CIR admit today and follow for potential admit pending insurance auth and bed availability.  Megan Salon, MS, CCC-SLP Rehab Admissions Coordinator  [phone] (celll) [phone] (office)

## 2020-12-01 NOTE — TOC Progression Note (Signed)
Transition of Care [facility]) - Progression Note    Patient Details  Name: Lisa Williams MRN: [id number] Date of Birth: July 14, 1960  [facility]) CM/SW Contact  Chapman Fitch, RN Phone Number: 12/01/2020, 3:08 PM  Clinical Narrative:    Per MD peer to peer has been denied for CIR Patient would like to purse SNF Presented bed offers.  Patient accepted [facility].  Per [facility] Cliff pending Berkley Harvey they could take patient tomorrow  Spoke with Tammy at [facility]. She will wait for the official denial from [employer] then start auth for [facility]    Expected Discharge Plan: IP Rehab Facility Barriers to Discharge: Continued Medical Work up  Expected Discharge Plan and Services Expected Discharge Plan: IP Rehab Facility     Post Acute Care Choice: IP Rehab Living arrangements for the past 2 months: Single Family Home                                       Social Determinants of Health (SDOH) Interventions    Readmission Risk Interventions No flowsheet data found.

## 2020-12-01 NOTE — Progress Notes (Signed)
Occupational Therapy Treatment Patient Details Name: Lisa Williams MRN: [id number] DOB: October 16, 1960 Today's Date: 12/01/2020    History of present illness Pt is a 59 y/o F admitted on 11/21/20 with c/c of fever, chills, cough & weakness. Pt found to be COVID (+). PMH: MS, HTN, HLD, tobacco abuse, chronic galactorrhea, neurogenic bladder, SVT, chronic back pain   OT comments  Pt seen for OT tx this date, Co-Tx with PT to maximize benefits given pt's limited activity tolerance. Pt tolerated 2 STS trials with MOD/MAX A +2 from elevated surface with ~1 min standing tolerance per trial. OT cueing for pt awareness of foot placement. Pt requires MAX A +2 for attempted toileting while sitting in recliner, but ultimately requests to get back to bed to complete as she is unsuccessful in sitting. Pt requires MOD A +2 for rolling to place bed pan and MAX A for attempted peri care, but pt was not soiled at this time. Will continue to follow. Currently appears that pt will not qualify for CIR (per CM and pt report) in which case, she will require other STR stay to return to walking and performing her basic self care at MOD I including toileting tasks.    Follow Up Recommendations  CIR    Equipment Recommendations  Other (comment) (strap to AFOs to assist with elevateing to cross-leg position)    Recommendations for Other Services      Precautions / Restrictions Precautions Precautions: Fall Precaution Comments: personal BLE AFOs/ PRAFOs Restrictions Weight Bearing Restrictions: No       Mobility Bed Mobility Overal bed mobility: Needs Assistance Bed Mobility: Supine to Sit;Sit to Supine     Supine to sit: HOB elevated;Min assist;Mod assist Sit to supine: Mod assist   General bed mobility comments: MIN A to elevated torso, HOB elevaed, MOD assist to advance LEs to EOB. Does require mod assist to progress BLEs back into bed.    Transfers Overall transfer level: Needs  assistance Equipment used: Rolling walker (2 wheeled) Transfers: Sit to/from Stand Sit to Stand: From elevated surface;Max assist;Mod assist;+2 safety/equipment        Lateral/Scoot Transfers: Min assist;+2 safety/equipment General transfer comment: 2 trials STS from elevated bed surface (~5-6") and 2 trials (to/from) lateral scoot transfers to recliner. Pt noted to have regressed since preveious therapy sessions and MD notified by therapy team    Balance Overall balance assessment: Needs assistance Sitting-balance support: Feet supported;Bilateral upper extremity supported Sitting balance-Leahy Scale: Fair Sitting balance - Comments: does have some occasions of min assist for safety due to core weakness.   Standing balance support: Bilateral upper extremity supported;During functional activity Standing balance-Leahy Scale: Zero Standing balance comment: completely reliant on BUE support due to weakness in BLEs. attempted to side step from FOB to [facility] however unable today 2/2 to fatigue/weakness                           ADL either performed or assessed with clinical judgement   ADL Overall ADL's : Needs assistance/impaired                         Toilet Transfer: Maximal assistance;+2 for physical assistance Toilet Transfer Details (indicate cue type and reason): to bed pan on recliner seat. Toileting- Clothing Manipulation and Hygiene: Maximal assistance Toileting - Clothing Manipulation Details (indicate cue type and reason): with partial stand from chair, but pt ultimately did not have successful  BM in sitting and requires getting back to bed to complete.     Functional mobility during ADLs:  (unsuccessful with attempts to walk, 2 trials of standing weight shifting.)       Vision Baseline Vision/History: Wears glasses Wears Glasses: At all times Patient Visual Report: No change from baseline     Perception     Praxis      Cognition  Arousal/Alertness: Awake/alert Behavior During Therapy: WFL for tasks assessed/performed Overall Cognitive Status: Within Functional Limits for tasks assessed                                 General Comments: A&Ox4. Pt was slightly depressed/down due to inability to go to CIR rehab. She did fully participate but emotionally/physically not as invested in session today.        Exercises Other Exercises Other Exercises: 2 STS trials wtih OT cueing for foot placement, 2 lateral scoot trials. Assist with toileting and peri care, ultimately completed in bed with rolling technique with MAX A.   Shoulder Instructions       General Comments      Pertinent Vitals/ Pain       Pain Assessment: No/denies pain  Home Living                                          Prior Functioning/Environment              Frequency  Min 3X/week        Progress Toward Goals  OT Goals(current goals can now be found in the care plan section)  Progress towards OT goals: Not progressing toward goals - comment (pt with increased fatigue/weakness this date, MD made aware, possible need for steroid indicated)  Acute Rehab OT Goals Patient Stated Goal: return to PLOF OT Goal Formulation: With patient Time For Goal Achievement: 12/06/20 Potential to Achieve Goals: Good  Plan Discharge plan remains appropriate;Frequency remains appropriate    Co-evaluation    PT/OT/SLP Co-Evaluation/Treatment: Yes Reason for Co-Treatment: Complexity of the patient's impairments (multi-system involvement);For patient/therapist safety;To address functional/ADL transfers PT goals addressed during session: Mobility/safety with mobility;Balance;Proper use of DME;Strengthening/ROM OT goals addressed during session: ADL's and self-care;Proper use of Adaptive equipment and DME      AM-PAC OT "6 Clicks" Daily Activity     Outcome Measure   Help from another person eating meals?: A  Little Help from another person taking care of personal grooming?: A Little Help from another person toileting, which includes using toliet, bedpan, or urinal?: Total Help from another person bathing (including washing, rinsing, drying)?: A Lot Help from another person to put on and taking off regular upper body clothing?: A Little Help from another person to put on and taking off regular lower body clothing?: A Lot 6 Click Score: 14    End of Session Equipment Utilized During Treatment: Rolling walker;Gait belt  OT Visit Diagnosis: Other abnormalities of gait and mobility (R26.89);Muscle weakness (generalized) (M62.81)   Activity Tolerance Patient tolerated treatment well   Patient Left in bed;with call bell/phone within reach;with bed alarm set   Nurse Communication Mobility status        Time: 3299-2426 OT Time Calculation (min): 58 min  Charges: OT General Charges $OT Visit: 1 Visit OT Treatments $Self Care/Home Management : 23-37 mins  Rejeana Brock, MS, OTR/L ascom [phone] 12/01/20, 6:22 PM

## 2020-12-01 NOTE — Plan of Care (Signed)
  Problem: Clinical Measurements: Goal: Ability to maintain clinical measurements within normal limits will improve Outcome: Progressing Goal: Will remain free from infection Outcome: Progressing Goal: Diagnostic test results will improve Outcome: Progressing Goal: Respiratory complications will improve Outcome: Progressing Goal: Cardiovascular complication will be avoided Outcome: Progressing   Problem: Pain Managment: Goal: General experience of comfort will improve Outcome: Progressing   Pt is involved in and agrees with the plan of care. V/S stable except low diastolic bp. Denies dizziness or SOB. Denies any pain at this time.

## 2020-12-01 NOTE — Progress Notes (Signed)
PROGRESS NOTE  Lisa Williams  DOB: [birth year]  PCP: Lauro Regulus, MD JOA:[id number]  DOA: 11/21/2020  LOS: 10 days  Hospital Day: 11   Chief Complaint  Patient presents with   Weakness    Brief narrative: Lisa Williams is a 60 y.o. female with PMH significant for multiple sclerosis, HTN, HLD, SVT, tobacco abuse, chronic galactorrhea, neurogenic bladder. Patient presented to the ED on 7/17 from home with complaint of fever up to 100.2, chills, cough mostly dry, intermittent chest pain and generalized weakness.   In the ED, patient was afebrile, 89% on room air Labs with white count 6.7 COVID PCR positive Chest x-ray negative for infiltration Admitted to hospitalist service for COVID infection. Neurology was also consulted because of suspicion of worsening multiple sclerosis deficits Patient received IV steroids for 3 days after which her weakness is gradually improving.  Subjective: Patient was seen and examined this morning.   Sitting up in bed.  Not in distress.  No new symptoms.  Assessment/Plan: COVID infection -Presented with fever, chest pain, dry cough -COVID test: PCR positive in the ED -Chest imaging: No infiltrates -Treatment: Completed 5-day course of IV remdesivir.  -Continue bronchodilators, Mucinex. -Not requiring submental oxygen -Procalcitonin level negative. Not on antibiotics  Generalized weakness in the setting of multiple sclerosis  -Generalized weakness is likely because of MS exacerbation due to infection.   -Neurology consulted.  Recommend MRI brain and C-spine.  However patient has claustrophobia and is refusing to get MRI.  She hopes to get stronger with treatment of COVID.  -Patient was given a 3-day course of IV Solu-Medrol 1 g after which she felt some improvement in her physical strength. -continue home Siponimod Fumarate (ok to let pt take her own meds) -continue home baclofen, Neurontin -Tizanidine was held  because of bradycardia.  Sinus bradycardia -Intermittent asymptomatic without prolonged pause or hypotension.  Tizanidine on hold.   Essential hypertension -Continue to monitor blood pressure on irbesartan -IV hydralazine as needed   Neurogenic bladder -Continue oxybutynin   Mobility: Encourage ambulation with PT Code Status:   Code Status: Full Code  Nutritional status: Body mass index is 37.43 kg/m.     Diet:  Diet Order             Diet Heart Room service appropriate? Yes; Fluid consistency: Thin  Diet effective now                  DVT prophylaxis: Lovenox subcu    Antimicrobials: Completed course of remdesivir Fluid: None Consultants: None Family Communication: None at bedside  Status is: Inpatient  Remains inpatient appropriate because: pending SNF placement.  Dispo: The patient is from: Home              Anticipated d/c is to: CIR denied by insurance company.  I called for Peer-to-peer review this afternoon. CIR denied.              Patient currently is medically stable to d/c.   Difficult to place patient No     Infusions:     Scheduled Meds:  baclofen  20 mg Oral TID   benzonatate  200 mg Oral TID   cholecalciferol  2,000 Units Oral Daily   enoxaparin (LOVENOX) injection  0.5 mg/kg Subcutaneous Q24H   gabapentin  100 mg Oral BID   irbesartan  150 mg Oral Daily   oxybutynin  10 mg Oral Daily   pantoprazole  40 mg Oral BID   Siponimod  Fumarate  2 mg Oral Daily    Antimicrobials: Anti-infectives (From admission, onward)    Start     Dose/Rate Route Frequency Ordered Stop   11/22/20 1000  remdesivir 100 mg in sodium chloride 0.9 % 100 mL IVPB       See Hyperspace for full Linked Orders Report.   100 mg 200 mL/hr over 30 Minutes Intravenous Daily 11/21/20 0957 11/25/20 1025   11/21/20 1100  remdesivir 200 mg in sodium chloride 0.9% 250 mL IVPB       See Hyperspace for full Linked Orders Report.   200 mg 580 mL/hr over 30 Minutes  Intravenous Once 11/21/20 0957 11/21/20 1154   11/21/20 0800  cefTRIAXone (ROCEPHIN) 2 g in sodium chloride 0.9 % 100 mL IVPB  Status:  Discontinued        2 g 200 mL/hr over 30 Minutes Intravenous Every 24 hours 11/21/20 0747 11/21/20 1027   11/21/20 0800  azithromycin (ZITHROMAX) 500 mg in sodium chloride 0.9 % 250 mL IVPB  Status:  Discontinued        500 mg 250 mL/hr over 60 Minutes Intravenous Every 24 hours 11/21/20 0747 11/21/20 1027       PRN meds: acetaminophen, albuterol, guaiFENesin-dextromethorphan, hydrALAZINE, ondansetron (ZOFRAN) IV, tiZANidine   Objective: Vitals:   12/01/20 0323 12/01/20 0820  BP: (!) 100/41 (!) 119/47  Pulse: 64 (!) 57  Resp: 18 16  Temp: 98.3 F (36.8 C) 98.4 F (36.9 C)  SpO2: 93% 99%   No intake or output data in the 24 hours ending 12/01/20 1338  Filed Weights   11/21/20 0804  Weight: 121.7 kg   Weight change:  Body mass index is 37.43 kg/m.   Physical Exam: General exam: Pleasant, middle-aged African-American female.  Sitting up in bed.  Not in distress  skin: No rashes, lesions or ulcers. HEENT: Atraumatic, normocephalic, no obvious bleeding Lungs: Clear to auscultation bilaterally CVS: Regular rate and rhythm, no murmur GI/Abd soft, nontender, nondistended, bowel sound present CNS: Alert, awake, oriented x3, at baseline, she has deficits in the lower extremities because of multiple sclerosis Psychiatry: Mood appropriate Extremities: No pedal edema, no calf tenderness  Data Review: I have personally reviewed the laboratory data and studies available.  Recent Labs  Lab 11/26/20 0647  WBC 8.3  NEUTROABS 7.5  HGB 13.9  HCT 39.2  MCV 76.0*  PLT 221    Recent Labs  Lab 11/26/20 0647 11/29/20 0436  NA 142 141  K 3.9 3.7  CL 106 108  CO2 28 28  GLUCOSE 150* 110*  BUN 35* 33*  CREATININE 0.70 0.74  CALCIUM 8.9 8.1*     F/u labs ordered Unresulted Labs (From admission, onward)    None        Signed, Lorin Glass, MD [facility] 12/01/2020

## 2020-12-01 NOTE — Progress Notes (Signed)
Inpatient Rehab Admissions Coordinator:    I do not have a bed for this pt. On CIR today. Insurance is requesting a peer to peer today. I discussed with pt. And reached out to acute MD to set it up.  Megan Salon, MS, CCC-SLP Rehab Admissions Coordinator  [phone] (celll) [phone] (office)

## 2020-12-01 NOTE — TOC Progression Note (Signed)
Transition of Care [facility]) - Progression Note    Patient Details  Name: Lisa Williams MRN: [id number] Date of Birth: [birth year]  [facility] CM/SW Contact  Chapman Fitch, RN Phone Number: 12/01/2020, 9:49 AM  Clinical Narrative:     Reached out to CIR [facility] to determine if there is an update on insurance authorization   Expected Discharge Plan: IP Rehab Facility Barriers to Discharge: Continued Medical Work up  Expected Discharge Plan and Services Expected Discharge Plan: IP Rehab Facility     Post Acute Care Choice: IP Rehab Living arrangements for the past 2 months: Single Family Home                                       Social Determinants of Health (SDOH) Interventions    Readmission Risk Interventions No flowsheet data found.

## 2020-12-01 NOTE — Progress Notes (Signed)
Inpatient Rehab Admissions Coordinator:    We received a denial for CIR from Pt.'s insurance following peer to peer today. I notified Pt. And she states understanding and that she would like to pursue short term rehab at local SNF. CIR will sign off.   Megan Salon, MS, CCC-SLP Rehab Admissions Coordinator  [phone] (celll) [phone] (office)

## 2020-12-01 NOTE — Progress Notes (Signed)
Physical Therapy Treatment Patient Details Name: Lisa Williams MRN: [id number] DOB: 1960/08/22 Today's Date: 12/01/2020    History of Present Illness Pt is a 60 y/o F admitted on 11/21/20 with c/c of fever, chills, cough & weakness. Pt found to be COVID (+). PMH: MS, HTN, HLD, tobacco abuse, chronic galactorrhea, neurogenic bladder, SVT, chronic back pain    PT Comments    PT/OT co-treat 2/2 to pt requiring +2 assistance for safety. She overall is cooperative and motivated but down due to being denied for inpatient rehab. She was able to stand a few time EOB with +2 assistance and then lateral transfer to/from recliner. MD notified that pt has demonstrated regression in abilities over past few days. Highly recommend continued skilled PT to address defcits while assisting  pt to PLOF/ improve independence with ADLs.    Follow Up Recommendations  CIR;SNF;Supervision/Assistance - 24 hour     Equipment Recommendations  None recommended by PT       Precautions / Restrictions Precautions Precautions: Fall Precaution Comments: personal BLE AFOs/ PRAFOs Restrictions Weight Bearing Restrictions: No    Mobility  Bed Mobility    Sit to supine: Mod assist   General bed mobility comments: Pt was already sitting EOB with OT upon author arriving to room. Does require mod assist to progress BLEs back into bed.    Transfers Overall transfer level: Needs assistance Equipment used: Rolling walker (2 wheeled) Transfers: Sit to/from Stand Sit to Stand: From elevated surface;Max assist;Mod assist;+2 safety/equipment        Lateral/Scoot Transfers: Min assist;+2 safety/equipment General transfer comment: Pt was able to stand 2 x EOB prior to performing lateral transfer to recliner and return. did sit on bedpan but unsuccessul BM. Pt fatigued quickly and was unable to clear feet during standing to advance to taking steps. MD was messaged/informed about regression over past 3-4 days in  strength.  Ambulation/Gait      General Gait Details: unable/unsafe   Balance Overall balance assessment: Needs assistance Sitting-balance support: Feet supported;Bilateral upper extremity supported Sitting balance-Leahy Scale: Fair Sitting balance - Comments: does have some occasions of min assist for safety due to core weakness.        Cognition Arousal/Alertness: Awake/alert Behavior During Therapy: WFL for tasks assessed/performed Overall Cognitive Status: Within Functional Limits for tasks assessed        General Comments: A&Ox4. Pt was slightly depressed/down due to inability to go to CIR rehab. She did fully participate but emotionally/physically not as invested in session today.             Pertinent Vitals/Pain Pain Assessment: No/denies pain     PT Goals (current goals can now be found in the care plan section) Acute Rehab PT Goals Patient Stated Goal: return to PLOF Progress towards PT goals: Not progressing toward goals - comment (Pt has had strength regression over past few days. MD ware.)    Frequency    7X/week      PT Plan Current plan remains appropriate    Co-evaluation PT/OT/SLP Co-Evaluation/Treatment: Yes Reason for Co-Treatment: Complexity of the patient's impairments (multi-system involvement);Necessary to address cognition/behavior during functional activity;For patient/therapist safety;To address functional/ADL transfers PT goals addressed during session: Mobility/safety with mobility;Strengthening/ROM;Balance;Proper use of DME        AM-PAC PT "6 Clicks" Mobility   Outcome Measure  Help needed turning from your back to your side while in a flat bed without using bedrails?: A Little Help needed moving from lying on your back  to sitting on the side of a flat bed without using bedrails?: A Lot Help needed moving to and from a bed to a chair (including a wheelchair)?: A Lot Help needed standing up from a chair using your arms (e.g.,  wheelchair or bedside chair)?: A Lot Help needed to walk in hospital room?: Total Help needed climbing 3-5 steps with a railing? : Total 6 Click Score: 11    End of Session Equipment Utilized During Treatment: Gait belt Activity Tolerance: Patient tolerated treatment well;Other (comment) (pt down/depressed about not getting into CIR however is now agreeable to SNF.) Patient left: in bed;with call bell/phone within reach;with bed alarm set Nurse Communication: Mobility status PT Visit Diagnosis: Unsteadiness on feet (R26.81);Difficulty in walking, not elsewhere classified (R26.2);Muscle weakness (generalized) (M62.81)     Time: 1400-1500 PT Time Calculation (min) (ACUTE ONLY): 60 min  Charges:  $Therapeutic Activity: 23-37 mins                     Jetta Lout PTA 12/01/20, 4:15 PM

## 2020-12-02 ENCOUNTER — Ambulatory Visit: Payer: PPO

## 2020-12-02 DIAGNOSIS — M6259 Muscle wasting and atrophy, not elsewhere classified, multiple sites: Secondary | ICD-10-CM | POA: Diagnosis not present

## 2020-12-02 DIAGNOSIS — R279 Unspecified lack of coordination: Secondary | ICD-10-CM | POA: Diagnosis not present

## 2020-12-02 DIAGNOSIS — E46 Unspecified protein-calorie malnutrition: Secondary | ICD-10-CM | POA: Diagnosis not present

## 2020-12-02 DIAGNOSIS — R2689 Other abnormalities of gait and mobility: Secondary | ICD-10-CM | POA: Diagnosis not present

## 2020-12-02 DIAGNOSIS — R5381 Other malaise: Secondary | ICD-10-CM | POA: Diagnosis not present

## 2020-12-02 DIAGNOSIS — I10 Essential (primary) hypertension: Secondary | ICD-10-CM | POA: Diagnosis not present

## 2020-12-02 DIAGNOSIS — R41841 Cognitive communication deficit: Secondary | ICD-10-CM | POA: Diagnosis not present

## 2020-12-02 DIAGNOSIS — U071 COVID-19: Secondary | ICD-10-CM | POA: Diagnosis not present

## 2020-12-02 DIAGNOSIS — M62838 Other muscle spasm: Secondary | ICD-10-CM | POA: Diagnosis not present

## 2020-12-02 DIAGNOSIS — M810 Age-related osteoporosis without current pathological fracture: Secondary | ICD-10-CM | POA: Diagnosis not present

## 2020-12-02 DIAGNOSIS — R1319 Other dysphagia: Secondary | ICD-10-CM | POA: Diagnosis not present

## 2020-12-02 DIAGNOSIS — Z6841 Body Mass Index (BMI) 40.0 and over, adult: Secondary | ICD-10-CM | POA: Diagnosis not present

## 2020-12-02 DIAGNOSIS — G35 Multiple sclerosis: Secondary | ICD-10-CM | POA: Diagnosis not present

## 2020-12-02 DIAGNOSIS — R531 Weakness: Secondary | ICD-10-CM | POA: Diagnosis not present

## 2020-12-02 DIAGNOSIS — R600 Localized edema: Secondary | ICD-10-CM | POA: Diagnosis not present

## 2020-12-02 DIAGNOSIS — N319 Neuromuscular dysfunction of bladder, unspecified: Secondary | ICD-10-CM | POA: Diagnosis not present

## 2020-12-02 DIAGNOSIS — J069 Acute upper respiratory infection, unspecified: Secondary | ICD-10-CM | POA: Diagnosis not present

## 2020-12-02 DIAGNOSIS — M6281 Muscle weakness (generalized): Secondary | ICD-10-CM | POA: Diagnosis not present

## 2020-12-02 DIAGNOSIS — M792 Neuralgia and neuritis, unspecified: Secondary | ICD-10-CM | POA: Diagnosis not present

## 2020-12-02 DIAGNOSIS — Z7401 Bed confinement status: Secondary | ICD-10-CM | POA: Diagnosis not present

## 2020-12-02 MED ORDER — GUAIFENESIN-DM 100-10 MG/5ML PO SYRP: 5.0000 mL | ORAL_SOLUTION | Freq: Four times a day (QID) | ORAL | 0 refills | Status: DC | PRN

## 2020-12-02 MED ORDER — PREDNISONE 10 MG PO TABS: ORAL_TABLET | ORAL | 0 refills | Status: DC

## 2020-12-02 MED ORDER — BENZONATATE 200 MG PO CAPS: 200.0000 mg | ORAL_CAPSULE | Freq: Three times a day (TID) | ORAL | 0 refills | Status: DC | PRN

## 2020-12-02 NOTE — Discharge Summary (Addendum)
Physician Discharge Summary  Mayli Covington Kindall ZOX:[id number] DOB: 1960/11/07 DOA: 11/21/2020  PCP: Lauro Regulus, MD  Admit date: 11/21/2020 Discharge date: 12/02/2020  Admitted From: Home Discharge disposition: SNF   Code Status: Full Code   Discharge Diagnosis:   Principal Problem:   Acute respiratory disease due to COVID-19 virus Active Problems:   MS (multiple sclerosis) (HCC)   Essential hypertension   Neurogenic bladder   Weakness   Chief Complaint  Patient presents with   Weakness    Brief narrative: Lisa Williams Steward is a 60 y.o. female with PMH significant for multiple sclerosis, HTN, HLD, SVT, tobacco abuse, chronic galactorrhea, neurogenic bladder. Patient presented to the ED on 7/17 from home with complaint of fever up to 100.2, chills, cough mostly dry, intermittent chest pain and generalized weakness.   In the ED, patient was afebrile, 89% on room air Labs with white count 6.7 COVID PCR positive Chest x-ray negative for infiltration Admitted to hospitalist service for COVID infection. Neurology was also consulted because of suspicion of worsening multiple sclerosis deficits Patient received IV steroids for 3 days after which her weakness is gradually improving.  Subjective: Patient was seen and examined this morning.   Sitting up in bed.  Not in distress  Assessment/Plan: COVID infection -Presented with fever, chest pain, dry cough -COVID test: PCR positive in the ED -Chest imaging: No infiltrates -Treatment: Completed 5-day course of IV remdesivir.  -Continue bronchodilators, Mucinex. -Not requiring submental oxygen -Procalcitonin level negative. Not on antibiotics  Generalized weakness in the setting of multiple sclerosis  -Generalized weakness is likely because of MS exacerbation due to infection.   -Neurology consulted.  Recommend MRI brain and C-spine.  However patient has claustrophobia and is refusing to get MRI.  She  hopes to get stronger with treatment of COVID.  -Patient was given a 3-day course of IV Solu-Medrol 1 g after which she felt some improvement in her physical strength.  For the last few days, patient is however having progressive decline in her physical strength.  With intense physical therapy at the SNF, she is anticipated to have an improvement in her overall strength. At patient's request I would start her on a tapering course of oral prednisone. -continue home Siponimod Fumarate (ok to let pt take her own meds) -continue home baclofen, Neurontin -Tizanidine was held because of bradycardia.  Sinus bradycardia -Intermittent asymptomatic without prolonged pause or hypotension.  Tizanidine on hold.   Essential hypertension -Currently controlled on telmisartan.  Continue to monitor.     Neurogenic bladder -Continue oxybutynin   Allergies as of 12/02/2020       Reactions   Amlodipine Swelling   Povidone Iodine Rash        Medication List     STOP taking these medications    predniSONE 5 MG tablet Commonly known as: DELTASONE   sulfamethoxazole-trimethoprim 800-160 MG tablet Commonly known as: BACTRIM DS   tiZANidine 2 MG tablet Commonly known as: ZANAFLEX       TAKE these medications    acetaminophen 500 MG tablet Commonly known as: TYLENOL Take 500-1,000 mg by mouth every 6 (six) hours as needed for moderate pain or headache.   baclofen 20 MG tablet Commonly known as: LIORESAL Take 1 tablet (20 mg total) by mouth 3 (three) times daily.   benzonatate 200 MG capsule Commonly known as: TESSALON Take 1 capsule (200 mg total) by mouth 3 (three) times daily as needed for cough.   Cholecalciferol 25 MCG (  1000 UT) tablet Take 2,000 Units by mouth daily.   gabapentin 100 MG capsule Commonly known as: NEURONTIN Take 1 capsule (100 mg total) by mouth 2 (two) times daily.   guaiFENesin-dextromethorphan 100-10 MG/5ML syrup Commonly known as: ROBITUSSIN DM Take 5 mLs  by mouth every 6 (six) hours as needed for cough.   ibandronate 150 MG tablet Commonly known as: BONIVA Take 150 mg by mouth every 30 (thirty) days.   Mayzent 2 MG Tabs Generic drug: Siponimod Fumarate Take 2 mg by mouth daily.   oxybutynin 10 MG 24 hr tablet Commonly known as: DITROPAN-XL Take 1 tablet (10 mg total) by mouth daily.   telmisartan 40 MG tablet Commonly known as: MICARDIS Take 40 mg by mouth daily.        Discharge Instructions:  Diet Recommendation: Cardiac diet   Follow with Primary MD Lauro Regulus, MD in 7 days   Get CBC/BMP checked in next visit within 1 week by PCP or SNF MD ( we routinely change or add medications that can affect your baseline labs and fluid status, therefore we recommend that you get the mentioned basic workup next visit with your PCP, your PCP may decide not to get them or add new tests based on their clinical decision)  On your next visit with your PCP, please Get Medicines reviewed and adjusted.  Please request your PCP  to go over all Hospital Tests and Procedure/Radiological results at the follow up, please get all Hospital records sent to your Prim MD by signing hospital release before you go home.  Activity: As tolerated with Full fall precautions use walker/cane & assistance as needed  For Heart failure patients - Check your Weight same time everyday, if you gain over 2 pounds, or you develop in leg swelling, experience more shortness of breath or chest pain, call your Primary MD immediately. Follow Cardiac Low Salt Diet and 1.5 lit/day fluid restriction.  If you have smoked or chewed Tobacco in the last 2 yrs please stop smoking, stop any regular Alcohol  and or any Recreational drug use.  If you experience worsening of your admission symptoms, develop shortness of breath, life threatening emergency, suicidal or homicidal thoughts you must seek medical attention immediately by calling 911 or calling your MD immediately   if symptoms less severe.  You Must read complete instructions/literature along with all the possible adverse reactions/side effects for all the Medicines you take and that have been prescribed to you. Take any new Medicines after you have completely understood and accpet all the possible adverse reactions/side effects.   Do not drive, operate heavy machinery, perform activities at heights, swimming or participation in water activities or provide baby sitting services if your were admitted for syncope or siezures until you have seen by Primary MD or a Neurologist and advised to do so again.  Do not drive when taking Pain medications.  Do not take more than prescribed Pain, Sleep and Anxiety Medications  Wear Seat belts while driving.   Please note You were cared for by a hospitalist during your hospital stay. If you have any questions about your discharge medications or the care you received while you were in the hospital after you are discharged, you can call the unit and asked to speak with the hospitalist on call if the hospitalist that took care of you is not available. Once you are discharged, your primary care physician will handle any further medical issues. Please note that NO REFILLS for any  discharge medications will be authorized once you are discharged, as it is imperative that you return to your primary care physician (or establish a relationship with a primary care physician if you do not have one) for your aftercare needs so that they can reassess your need for medications and monitor your lab values.    Follow ups:    Contact information for follow-up providers     Lauro RegulusAnderson, Marshall W, MD Follow up.   Specialty: Internal Medicine Contact information: [address] [place] [address] [phone]              Contact information for after-discharge care     Destination     [facility] .   Service: Skilled Nursing Contact  information: [address] [facility] [place] [phone]                     Wound care:   Wound / Incision (Open or Dehisced) 04/18/18 Incision - Open Groin Left with packing (Active)  Date First Assessed/Time First Assessed: 04/18/18 1334   Wound Type: Incision - Open  Location: Groin  Location Orientation: Left  Wound Description (Comments): with packing    Assessments 04/18/2018  1:01 PM 04/19/2018  8:00 AM  Dressing Type Gauze (Comment) Gauze (Comment)  Dressing Changed Reinforced --  Dressing Status Dry;Intact;Old drainage Dry;Intact;Old drainage  Site / Wound Assessment -- Dressing in place / Unable to assess     No Linked orders to display    Discharge Exam:   Vitals:   12/01/20 1513 12/01/20 2035 12/02/20 0634 12/02/20 0901  BP: (!) 132/55 (!) 116/47 (!) 112/54 129/62  Pulse: 69 63 (!) 57 (!) 59  Resp: 14 19 17 18   Temp: 99 F (37.2 C) 98.8 F (37.1 C) 97.7 F (36.5 C) 98.4 F (36.9 C)  TempSrc: Oral Oral Oral Oral  SpO2: 99% 100% 99% 100%  Weight:      Height:        Body mass index is 37.43 kg/m.  General exam: Pleasant middle-aged African-American female Skin: No rashes, lesions or ulcers. HEENT: Atraumatic, normocephalic, no obvious bleeding Lungs: Clear to auscultation bilaterally CVS: Regular rate and rhythm, no murmur GI/Abd soft, nontender, nondistended, bowel sound present CNS: Alert, awake, oriented x3.  Weakness in both lower extremities Psychiatry: Mood appropriate.  Although cheerful Extremities: No pedal edema, no calf tenderness  Time coordinating discharge: 35 minutes   The results of significant diagnostics from this hospitalization (including imaging, microbiology, ancillary and laboratory) are listed below for reference.    Procedures and Diagnostic Studies:   DG Chest Port 1 View  Result Date: 11/21/2020 CLINICAL DATA:  60 year old female with possible sepsis. Generalized weakness. EXAM: PORTABLE CHEST 1  VIEW COMPARISON:  Portable chest 01/16/2017 and earlier. FINDINGS: Portable AP upright view at 0756 hours. Mildly lower lung volumes. Normal cardiac size and mediastinal contours. Visualized tracheal air column is within normal limits. Mild eventration of the right hemidiaphragm, normal variant. Allowing for portable technique the lungs are clear. No pneumothorax or pleural effusion. Mild scoliosis. No acute osseous abnormality identified. Paucity of bowel gas in the upper abdomen. IMPRESSION: Lower lung volumes.  Otherwise negative portable chest. Electronically Signed   By: Odessa FlemingH  Hall M.D.   On: 11/21/2020 08:21     Labs:   Basic Metabolic Panel: Recent Labs  Lab 11/26/20 0647 11/29/20 0436  NA 142 141  K 3.9 3.7  CL 106 108  CO2 28 28  GLUCOSE 150* 110*  BUN 35* 33*  CREATININE 0.70 0.74  CALCIUM 8.9 8.1*   GFR Estimated Creatinine Clearance: 109 mL/min (by C-G formula based on SCr of 0.74 mg/dL). Liver Function Tests: Recent Labs  Lab 11/26/20 0647 11/29/20 0436  AST 53* 32  ALT 111* 69*  ALKPHOS 87 76  BILITOT 0.6 0.9  PROT 6.4* 5.5*  ALBUMIN 3.2* 2.9*   No results for input(s): LIPASE, AMYLASE in the last 168 hours. No results for input(s): AMMONIA in the last 168 hours. Coagulation profile No results for input(s): INR, PROTIME in the last 168 hours.  CBC: Recent Labs  Lab 11/26/20 0647  WBC 8.3  NEUTROABS 7.5  HGB 13.9  HCT 39.2  MCV 76.0*  PLT 221   Cardiac Enzymes: No results for input(s): CKTOTAL, CKMB, CKMBINDEX, TROPONINI in the last 168 hours. BNP: Invalid input(s): POCBNP CBG: No results for input(s): GLUCAP in the last 168 hours. D-Dimer No results for input(s): DDIMER in the last 72 hours. Hgb A1c No results for input(s): HGBA1C in the last 72 hours. Lipid Profile No results for input(s): CHOL, HDL, LDLCALC, TRIG, CHOLHDL, LDLDIRECT in the last 72 hours. Thyroid function studies No results for input(s): TSH, T4TOTAL, T3FREE, THYROIDAB in the  last 72 hours.  Invalid input(s): FREET3 Anemia work up No results for input(s): VITAMINB12, FOLATE, FERRITIN, TIBC, IRON, RETICCTPCT in the last 72 hours. Microbiology No results found for this or any previous visit (from the past 240 hour(s)).   Signed: Melina Schools Jaydence Arnesen  [facility] 12/02/2020, 9:57 AM

## 2020-12-02 NOTE — Care Management Important Message (Signed)
Important Message  Patient Details  Name: Lisa Williams MRN: [id number] Date of Birth: 08-22-1960   Medicare Important Message Given:  Yes  Reviewed Medicare IM with patient via room phone.  Aware of Medicare IM right to appeal. Declined copy at this time.    Johnell Comings 12/02/2020, 11:35 AM

## 2020-12-02 NOTE — Plan of Care (Signed)
  Problem: Education: Goal: Knowledge of General Education information will improve Description: Including pain rating scale, medication(s)/side effects and non-pharmacologic comfort measures 12/02/2020 1049 by Renette Butters, RN Outcome: Adequate for Discharge 12/02/2020 1049 by Renette Butters, RN Outcome: Progressing   Problem: Health Behavior/Discharge Planning: Goal: Ability to manage health-related needs will improve 12/02/2020 1049 by Renette Butters, RN Outcome: Adequate for Discharge 12/02/2020 1049 by Renette Butters, RN Outcome: Progressing   Problem: Clinical Measurements: Goal: Ability to maintain clinical measurements within normal limits will improve 12/02/2020 1049 by Renette Butters, RN Outcome: Adequate for Discharge 12/02/2020 1049 by Renette Butters, RN Outcome: Progressing Goal: Will remain free from infection 12/02/2020 1049 by Renette Butters, RN Outcome: Adequate for Discharge 12/02/2020 1049 by Renette Butters, RN Outcome: Progressing Goal: Diagnostic test results will improve 12/02/2020 1049 by Renette Butters, RN Outcome: Adequate for Discharge 12/02/2020 1049 by Renette Butters, RN Outcome: Progressing Goal: Respiratory complications will improve 12/02/2020 1049 by Renette Butters, RN Outcome: Adequate for Discharge 12/02/2020 1049 by Renette Butters, RN Outcome: Progressing Goal: Cardiovascular complication will be avoided 12/02/2020 1049 by Renette Butters, RN Outcome: Adequate for Discharge 12/02/2020 1049 by Renette Butters, RN Outcome: Progressing   Problem: Activity: Goal: Risk for activity intolerance will decrease 12/02/2020 1049 by Renette Butters, RN Outcome: Adequate for Discharge 12/02/2020 1049 by Renette Butters, RN Outcome: Progressing   Problem: Nutrition: Goal: Adequate nutrition will be maintained 12/02/2020 1049 by Renette Butters, RN Outcome: Adequate for Discharge 12/02/2020 1049 by  Renette Butters, RN Outcome: Progressing   Problem: Coping: Goal: Level of anxiety will decrease 12/02/2020 1049 by Renette Butters, RN Outcome: Adequate for Discharge 12/02/2020 1049 by Renette Butters, RN Outcome: Progressing   Problem: Elimination: Goal: Will not experience complications related to bowel motility 12/02/2020 1049 by Renette Butters, RN Outcome: Adequate for Discharge 12/02/2020 1049 by Renette Butters, RN Outcome: Progressing Goal: Will not experience complications related to urinary retention 12/02/2020 1049 by Renette Butters, RN Outcome: Adequate for Discharge 12/02/2020 1049 by Renette Butters, RN Outcome: Progressing   Problem: Pain Managment: Goal: General experience of comfort will improve 12/02/2020 1049 by Renette Butters, RN Outcome: Adequate for Discharge 12/02/2020 1049 by Renette Butters, RN Outcome: Progressing   Problem: Safety: Goal: Ability to remain free from injury will improve 12/02/2020 1049 by Renette Butters, RN Outcome: Adequate for Discharge 12/02/2020 1049 by Renette Butters, RN Outcome: Progressing   Problem: Skin Integrity: Goal: Risk for impaired skin integrity will decrease 12/02/2020 1049 by Renette Butters, RN Outcome: Adequate for Discharge 12/02/2020 1049 by Renette Butters, RN Outcome: Progressing

## 2020-12-02 NOTE — Plan of Care (Signed)

## 2020-12-02 NOTE — TOC Transition Note (Signed)
Transition of Care [facility]) - CM/SW Discharge Note   Patient Details  Name: Lisa Williams MRN: [id number] Date of Birth: 1960-06-15  [facility] CM/SW Contact:  Chapman Fitch, RN Phone Number: 12/02/2020, 10:43 AM   Clinical Narrative:    Insurance Berkley Harvey [phone] [facility] Berkley Harvey [phone]   Patient to discharge to [facility] today Patient states that she will update her family Bedside RN to call report  DC info sent in [facility] packet on chart EMS transport called   Final next level of care: Skilled Nursing Facility Barriers to Discharge: No Barriers Identified   Patient Goals and CMS Choice        Discharge Placement              Patient chooses bed at: [facility] [place] Patient to be transferred to facility by: EMS Name of family member notified: Patient to update family    Discharge Plan and Services     Post Acute Care Choice: IP Rehab                               Social Determinants of Health (SDOH) Interventions     Readmission Risk Interventions No flowsheet data found.

## 2020-12-07 ENCOUNTER — Ambulatory Visit: Payer: PPO

## 2020-12-07 DIAGNOSIS — U071 COVID-19: Secondary | ICD-10-CM | POA: Diagnosis not present

## 2020-12-07 DIAGNOSIS — I10 Essential (primary) hypertension: Secondary | ICD-10-CM | POA: Diagnosis not present

## 2020-12-07 DIAGNOSIS — G35 Multiple sclerosis: Secondary | ICD-10-CM | POA: Diagnosis not present

## 2020-12-07 DIAGNOSIS — M6281 Muscle weakness (generalized): Secondary | ICD-10-CM | POA: Diagnosis not present

## 2020-12-09 ENCOUNTER — Ambulatory Visit: Payer: PPO

## 2020-12-14 ENCOUNTER — Ambulatory Visit: Payer: PPO

## 2020-12-16 ENCOUNTER — Ambulatory Visit: Payer: PPO

## 2020-12-17 DIAGNOSIS — U071 COVID-19: Secondary | ICD-10-CM | POA: Diagnosis not present

## 2020-12-17 DIAGNOSIS — R5381 Other malaise: Secondary | ICD-10-CM | POA: Diagnosis not present

## 2020-12-17 DIAGNOSIS — G35 Multiple sclerosis: Secondary | ICD-10-CM | POA: Diagnosis not present

## 2020-12-17 DIAGNOSIS — I10 Essential (primary) hypertension: Secondary | ICD-10-CM | POA: Diagnosis not present

## 2020-12-21 ENCOUNTER — Ambulatory Visit: Payer: PPO

## 2020-12-23 ENCOUNTER — Ambulatory Visit: Payer: PPO

## 2020-12-28 ENCOUNTER — Ambulatory Visit: Payer: PPO

## 2020-12-28 DIAGNOSIS — R600 Localized edema: Secondary | ICD-10-CM | POA: Diagnosis not present

## 2020-12-30 ENCOUNTER — Ambulatory Visit: Payer: PPO

## 2021-01-03 DIAGNOSIS — I471 Supraventricular tachycardia: Secondary | ICD-10-CM | POA: Diagnosis not present

## 2021-01-03 DIAGNOSIS — N319 Neuromuscular dysfunction of bladder, unspecified: Secondary | ICD-10-CM | POA: Diagnosis not present

## 2021-01-03 DIAGNOSIS — M792 Neuralgia and neuritis, unspecified: Secondary | ICD-10-CM | POA: Diagnosis not present

## 2021-01-03 DIAGNOSIS — E8809 Other disorders of plasma-protein metabolism, not elsewhere classified: Secondary | ICD-10-CM | POA: Diagnosis not present

## 2021-01-03 DIAGNOSIS — K592 Neurogenic bowel, not elsewhere classified: Secondary | ICD-10-CM | POA: Diagnosis not present

## 2021-01-03 DIAGNOSIS — G35 Multiple sclerosis: Secondary | ICD-10-CM | POA: Diagnosis not present

## 2021-01-03 DIAGNOSIS — M62838 Other muscle spasm: Secondary | ICD-10-CM | POA: Diagnosis not present

## 2021-01-03 DIAGNOSIS — E785 Hyperlipidemia, unspecified: Secondary | ICD-10-CM | POA: Diagnosis not present

## 2021-01-03 DIAGNOSIS — Z87891 Personal history of nicotine dependence: Secondary | ICD-10-CM | POA: Diagnosis not present

## 2021-01-03 DIAGNOSIS — I10 Essential (primary) hypertension: Secondary | ICD-10-CM | POA: Diagnosis not present

## 2021-01-03 DIAGNOSIS — M810 Age-related osteoporosis without current pathological fracture: Secondary | ICD-10-CM | POA: Diagnosis not present

## 2021-01-03 DIAGNOSIS — U071 COVID-19: Secondary | ICD-10-CM | POA: Diagnosis not present

## 2021-01-03 DIAGNOSIS — E46 Unspecified protein-calorie malnutrition: Secondary | ICD-10-CM | POA: Diagnosis not present

## 2021-01-04 ENCOUNTER — Ambulatory Visit: Payer: PPO

## 2021-01-04 DIAGNOSIS — E669 Obesity, unspecified: Secondary | ICD-10-CM | POA: Diagnosis not present

## 2021-01-04 DIAGNOSIS — G35 Multiple sclerosis: Secondary | ICD-10-CM | POA: Diagnosis not present

## 2021-01-06 ENCOUNTER — Ambulatory Visit: Payer: PPO

## 2021-01-07 DIAGNOSIS — M792 Neuralgia and neuritis, unspecified: Secondary | ICD-10-CM | POA: Diagnosis not present

## 2021-01-07 DIAGNOSIS — N319 Neuromuscular dysfunction of bladder, unspecified: Secondary | ICD-10-CM | POA: Diagnosis not present

## 2021-01-07 DIAGNOSIS — E8809 Other disorders of plasma-protein metabolism, not elsewhere classified: Secondary | ICD-10-CM | POA: Diagnosis not present

## 2021-01-07 DIAGNOSIS — K592 Neurogenic bowel, not elsewhere classified: Secondary | ICD-10-CM | POA: Diagnosis not present

## 2021-01-07 DIAGNOSIS — Z87891 Personal history of nicotine dependence: Secondary | ICD-10-CM | POA: Diagnosis not present

## 2021-01-07 DIAGNOSIS — G35 Multiple sclerosis: Secondary | ICD-10-CM | POA: Diagnosis not present

## 2021-01-07 DIAGNOSIS — M62838 Other muscle spasm: Secondary | ICD-10-CM | POA: Diagnosis not present

## 2021-01-07 DIAGNOSIS — E46 Unspecified protein-calorie malnutrition: Secondary | ICD-10-CM | POA: Diagnosis not present

## 2021-01-07 DIAGNOSIS — U071 COVID-19: Secondary | ICD-10-CM | POA: Diagnosis not present

## 2021-01-07 DIAGNOSIS — M810 Age-related osteoporosis without current pathological fracture: Secondary | ICD-10-CM | POA: Diagnosis not present

## 2021-01-07 DIAGNOSIS — E785 Hyperlipidemia, unspecified: Secondary | ICD-10-CM | POA: Diagnosis not present

## 2021-01-07 DIAGNOSIS — I10 Essential (primary) hypertension: Secondary | ICD-10-CM | POA: Diagnosis not present

## 2021-01-07 DIAGNOSIS — I471 Supraventricular tachycardia: Secondary | ICD-10-CM | POA: Diagnosis not present

## 2021-01-11 ENCOUNTER — Ambulatory Visit: Payer: PPO

## 2021-01-13 ENCOUNTER — Ambulatory Visit: Payer: PPO

## 2021-01-14 DIAGNOSIS — I10 Essential (primary) hypertension: Secondary | ICD-10-CM | POA: Diagnosis not present

## 2021-01-14 DIAGNOSIS — M810 Age-related osteoporosis without current pathological fracture: Secondary | ICD-10-CM | POA: Diagnosis not present

## 2021-01-14 DIAGNOSIS — G35 Multiple sclerosis: Secondary | ICD-10-CM | POA: Diagnosis not present

## 2021-01-14 DIAGNOSIS — E46 Unspecified protein-calorie malnutrition: Secondary | ICD-10-CM | POA: Diagnosis not present

## 2021-01-14 DIAGNOSIS — U071 COVID-19: Secondary | ICD-10-CM | POA: Diagnosis not present

## 2021-01-14 DIAGNOSIS — N319 Neuromuscular dysfunction of bladder, unspecified: Secondary | ICD-10-CM | POA: Diagnosis not present

## 2021-01-14 DIAGNOSIS — I471 Supraventricular tachycardia: Secondary | ICD-10-CM | POA: Diagnosis not present

## 2021-01-18 ENCOUNTER — Ambulatory Visit: Payer: PPO

## 2021-01-20 ENCOUNTER — Ambulatory Visit: Payer: PPO

## 2021-01-25 ENCOUNTER — Ambulatory Visit: Payer: PPO

## 2021-01-25 DIAGNOSIS — E46 Unspecified protein-calorie malnutrition: Secondary | ICD-10-CM | POA: Diagnosis not present

## 2021-01-25 DIAGNOSIS — M792 Neuralgia and neuritis, unspecified: Secondary | ICD-10-CM | POA: Diagnosis not present

## 2021-01-25 DIAGNOSIS — G35 Multiple sclerosis: Secondary | ICD-10-CM | POA: Diagnosis not present

## 2021-01-25 DIAGNOSIS — K592 Neurogenic bowel, not elsewhere classified: Secondary | ICD-10-CM | POA: Diagnosis not present

## 2021-01-25 DIAGNOSIS — N319 Neuromuscular dysfunction of bladder, unspecified: Secondary | ICD-10-CM | POA: Diagnosis not present

## 2021-01-25 DIAGNOSIS — Z87891 Personal history of nicotine dependence: Secondary | ICD-10-CM | POA: Diagnosis not present

## 2021-01-25 DIAGNOSIS — E8809 Other disorders of plasma-protein metabolism, not elsewhere classified: Secondary | ICD-10-CM | POA: Diagnosis not present

## 2021-01-25 DIAGNOSIS — U071 COVID-19: Secondary | ICD-10-CM | POA: Diagnosis not present

## 2021-01-25 DIAGNOSIS — M62838 Other muscle spasm: Secondary | ICD-10-CM | POA: Diagnosis not present

## 2021-01-25 DIAGNOSIS — E785 Hyperlipidemia, unspecified: Secondary | ICD-10-CM | POA: Diagnosis not present

## 2021-01-25 DIAGNOSIS — M810 Age-related osteoporosis without current pathological fracture: Secondary | ICD-10-CM | POA: Diagnosis not present

## 2021-01-25 DIAGNOSIS — I10 Essential (primary) hypertension: Secondary | ICD-10-CM | POA: Diagnosis not present

## 2021-01-25 DIAGNOSIS — I471 Supraventricular tachycardia: Secondary | ICD-10-CM | POA: Diagnosis not present

## 2021-01-27 ENCOUNTER — Ambulatory Visit: Payer: PPO

## 2021-01-27 DIAGNOSIS — G35 Multiple sclerosis: Secondary | ICD-10-CM

## 2021-01-31 ENCOUNTER — Ambulatory Visit (INDEPENDENT_AMBULATORY_CARE_PROVIDER_SITE_OTHER): Payer: PPO | Admitting: Neurology

## 2021-01-31 ENCOUNTER — Encounter: Payer: Self-pay | Admitting: Neurology

## 2021-01-31 VITALS — BP 176/84 | HR 66

## 2021-01-31 DIAGNOSIS — G35 Multiple sclerosis: Secondary | ICD-10-CM

## 2021-01-31 DIAGNOSIS — M62838 Other muscle spasm: Secondary | ICD-10-CM | POA: Diagnosis not present

## 2021-01-31 DIAGNOSIS — K592 Neurogenic bowel, not elsewhere classified: Secondary | ICD-10-CM | POA: Diagnosis not present

## 2021-01-31 DIAGNOSIS — Z5181 Encounter for therapeutic drug level monitoring: Secondary | ICD-10-CM

## 2021-01-31 DIAGNOSIS — N319 Neuromuscular dysfunction of bladder, unspecified: Secondary | ICD-10-CM

## 2021-01-31 MED ORDER — TIZANIDINE HCL 2 MG PO TABS
2.0000 mg | ORAL_TABLET | Freq: Four times a day (QID) | ORAL | 3 refills | Status: DC | PRN
Start: 2021-01-31 — End: 2021-08-01

## 2021-01-31 NOTE — Progress Notes (Addendum)
Reason for visit: Multiple sclerosis, paraparesis  Lisa Williams is an 60 y.o. female  History of present illness:  Lisa Williams is a 60 year old right-handed black female with a history of multiple sclerosis.  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  The patient has a significant gait disorder.  She was in the hospital on 21 November 2020 with COVID infection which worsened her MS symptoms.  The patient is now at home getting home health physical therapy.  She is walking with her walker up to 40 feet.  She has not had any falls.  She reports no change in vision or any change in bowel or bladder control.  She does have neurogenic bowel and bladder.  She does have some back spasms off and on, but she is only taking 2 of her 2 mg tizanidine a day.  She takes baclofen 20 mg 3 times daily.  She returns to the office today for an evaluation.  She is tolerating the Mayzent quite well.  Past Medical History:  Diagnosis Date   Abdominal pain, right upper quadrant    Back pain    Calculus of kidney 12/09/2013   Chronic back pain    unspecified   Chronic left shoulder pain 07/19/2015   Complication of anesthesia    Functional disorder of bladder    other   Galactorrhea 11/26/2014   Chronic    Hereditary and idiopathic neuropathy 08/19/2013   HPV test positive    Hypercholesteremia 08/19/2013   Hypertension    Incomplete bladder emptying    Microscopic hematuria    MS (multiple sclerosis) (HCC)    Muscle spasticity 05/21/2014   Nonspecific findings on examination of urine    other   Osteopenia    PONV (postoperative nausea and vomiting)    Status post laparoscopic supracervical hysterectomy 11/26/2014   Tobacco user 11/26/2014   Wrist fracture     Past Surgical History:  Procedure Laterality Date   bilateral tubal ligation  1996   BREAST CYST EXCISION Left 2002   FRACTURE SURGERY     KNEE SURGERY     right   LAPAROSCOPIC SUPRACERVICAL  HYSTERECTOMY  08/05/2013   ORIF WRIST FRACTURE Left 01/17/2017   Procedure: OPEN REDUCTION INTERNAL FIXATION (ORIF) WRIST FRACTURE;  Surgeon: Lyndle Herrlich, MD;  Location: [facility] ORS;  Service: Orthopedics;  Laterality: Left;   RADIOLOGY WITH ANESTHESIA N/A 03/18/2020   Procedure: MRI WITH ANESTHESIA CERVICAL SPINE AND BRAIN  WITH AND WITHOUT CONTRAST;  Surgeon: Radiologist, Medication, MD;  Location: [facility] OR;  Service: Radiology;  Laterality: N/A;   TUBAL LIGATION Bilateral    VAGINAL HYSTERECTOMY  03/2006    Family History  Problem Relation Age of Onset   Sickle cell trait Sister    Hypertension Sister    Supraventricular tachycardia Sister    Hypertension Sister    Diabetes Maternal Grandmother    Liver cancer Maternal Grandmother    Diabetes Maternal Aunt    Breast cancer Maternal Aunt        great MAT   Ovarian cancer Paternal Grandmother    Nephrolithiasis Father    Hypertension Father    Prostate cancer Father    Kidney disease Father    Heart failure Father    Hypertension Sister    Osteoarthritis Mother    Hypertension Mother    Hyperlipidemia Mother    GU problems Neg Hx    Urolithiasis Neg Hx     Social history:  reports that she has quit smoking. Her smoking use included cigarettes. She smoked an average of .5 packs per day. She has never used smokeless tobacco. She reports that she does not drink alcohol and does not use drugs.    Allergies  Allergen Reactions   Amlodipine Swelling   Povidone Iodine Rash    Medications:  Prior to Admission medications   Medication Sig Start Date End Date Taking? Authorizing Provider  acetaminophen (TYLENOL) 500 MG tablet Take 500-1,000 mg by mouth every 6 (six) hours as needed for moderate pain or headache.   Yes [provider]  baclofen (LIORESAL) 20 MG tablet Take 1 tablet (20 mg total) by mouth 3 (three) times daily. 04/08/20  Yes York Spaniel, MD  Cholecalciferol 25 MCG (1000 UT) tablet Take 2,000 Units by  mouth daily.   Yes [provider]  gabapentin (NEURONTIN) 100 MG capsule Take 1 capsule (100 mg total) by mouth 2 (two) times daily. 04/08/20  Yes York Spaniel, MD  ibandronate (BONIVA) 150 MG tablet Take 150 mg by mouth every 30 (thirty) days.  05/12/19  Yes [provider]  oxybutynin (DITROPAN-XL) 10 MG 24 hr tablet Take 1 tablet (10 mg total) by mouth daily. 04/06/20  Yes Vanna Scotland, MD  predniSONE (DELTASONE) 10 MG tablet Take 6 tabs twice a day for 2 days, then take 4 tabs twice a day for 2 days, then take 4 tabs daily for 2 days, then take 3 tabs daily for 2 days, then take 2 tabs daily for 2 days, then take 1 tab daily for 2 days, then STOP. 12/02/20  Yes Dahal, Melina Schools, MD  Siponimod Fumarate (MAYZENT) 2 MG TABS Take 2 mg by mouth daily.   Yes York Spaniel, MD  telmisartan (MICARDIS) 40 MG tablet Take 40 mg by mouth daily.   Yes [provider]    ROS:  Out of a complete 14 system review of symptoms, the patient complains only of the following symptoms, and all other reviewed systems are negative.  Walking difficulty Bowel and bladder control issues Leg weakness  Blood pressure (!) 176/84, pulse 66.  Physical Exam  General: The patient is alert and cooperative at the time of the examination.  The patient is markedly obese.  Skin: No significant peripheral edema is noted.   Neurologic Exam  Mental status: The patient is alert and oriented x 3 at the time of the examination. The patient has apparent normal recent and remote memory, with an apparently normal attention span and concentration ability.   Cranial nerves: Facial symmetry is present. Speech is normal, no aphasia or dysarthria is noted. Extraocular movements are full. Visual fields are full.  Pupils are equal, round, and reactive to light.  Discs are flat bilaterally.  Motor: The patient has good strength in the upper extremities.  With the lower extremities, the patient has 2/5  strength with hip flexion and 3/5 strength with knee extension bilaterally.  The patient wears bilateral AFO braces.  Sensory examination: Soft touch sensation is symmetric on the face, arms, and legs.  Coordination: The patient has good finger-nose-finger bilaterally.  She cannot perform heel-to-shin on either side.  Gait and station: The patient did not bring her walker in today, she was not ambulated.  Reflexes: Deep tendon reflexes are symmetric.   Assessment/Plan:  1.  Multiple sclerosis  2.  Paraparesis, gait disorder  3.  Neurogenic bowel and bladder  The patient will continue the Mayzent.  A prescription  was given for a tub transfer bench.  The patient will continue her home health physical therapy.  We will check blood work today.  She will follow-up in 6 months, she can be seen through Dr. Epimenio Foot in the future.  Marlan Palau MD 01/31/2021 3:18 PM  [facility] [address] [place], [place] [address]  Phone [phone] Fax [phone]  Addendum: Due to her limited mobility as a result of paraparesis from multiple sclerosis, she requires a hospital bed to allow changes in positioning.  Richard A. Epimenio Foot, MD, PhD, FAAN Certified in Neurology, Clinical Neurophysiology, Sleep Medicine, Pain Medicine and Neuroimaging Director, Multiple Sclerosis [facility] at [facility]  [facility] [address] [place], [place] [address] [phone]

## 2021-02-01 ENCOUNTER — Ambulatory Visit: Payer: PPO

## 2021-02-01 ENCOUNTER — Encounter: Payer: Self-pay | Admitting: Hematology and Oncology

## 2021-02-01 ENCOUNTER — Other Ambulatory Visit: Payer: Self-pay | Admitting: Neurology

## 2021-02-01 LAB — CBC WITH DIFFERENTIAL/PLATELET
Basophils Absolute: 0 10*3/uL (ref 0.0–0.2)
Basos: 1 %
EOS (ABSOLUTE): 0.1 10*3/uL (ref 0.0–0.4)
Eos: 2 %
Hematocrit: 41.4 % (ref 34.0–46.6)
Hemoglobin: 13.3 g/dL (ref 11.1–15.9)
Immature Grans (Abs): 0 10*3/uL (ref 0.0–0.1)
Immature Granulocytes: 0 %
Lymphocytes Absolute: 0.5 10*3/uL — ABNORMAL LOW (ref 0.7–3.1)
Lymphs: 8 %
MCH: 26.2 pg — ABNORMAL LOW (ref 26.6–33.0)
MCHC: 32.1 g/dL (ref 31.5–35.7)
MCV: 82 fL (ref 79–97)
Monocytes Absolute: 0.8 10*3/uL (ref 0.1–0.9)
Monocytes: 12 %
Neutrophils Absolute: 4.8 10*3/uL (ref 1.4–7.0)
Neutrophils: 77 %
Platelets: 236 10*3/uL (ref 150–450)
RBC: 5.07 x10E6/uL (ref 3.77–5.28)
RDW: 16 % — ABNORMAL HIGH (ref 11.7–15.4)
WBC: 6.2 10*3/uL (ref 3.4–10.8)

## 2021-02-01 LAB — COMPREHENSIVE METABOLIC PANEL
ALT: 46 IU/L — ABNORMAL HIGH (ref 0–32)
AST: 32 IU/L (ref 0–40)
Albumin/Globulin Ratio: 1.7 (ref 1.2–2.2)
Albumin: 4.2 g/dL (ref 3.8–4.9)
Alkaline Phosphatase: 134 IU/L — ABNORMAL HIGH (ref 44–121)
BUN/Creatinine Ratio: 40 — ABNORMAL HIGH (ref 9–23)
BUN: 24 mg/dL (ref 6–24)
Bilirubin Total: 0.4 mg/dL (ref 0.0–1.2)
CO2: 22 mmol/L (ref 20–29)
Calcium: 9.4 mg/dL (ref 8.7–10.2)
Chloride: 109 mmol/L — ABNORMAL HIGH (ref 96–106)
Creatinine, Ser: 0.6 mg/dL (ref 0.57–1.00)
Globulin, Total: 2.5 g/dL (ref 1.5–4.5)
Glucose: 86 mg/dL (ref 70–99)
Potassium: 5.1 mmol/L (ref 3.5–5.2)
Sodium: 144 mmol/L (ref 134–144)
Total Protein: 6.7 g/dL (ref 6.0–8.5)
eGFR: 103 mL/min/{1.73_m2} (ref >59)

## 2021-02-03 ENCOUNTER — Ambulatory Visit: Payer: PPO

## 2021-02-08 ENCOUNTER — Other Ambulatory Visit: Payer: Self-pay

## 2021-02-08 ENCOUNTER — Ambulatory Visit: Payer: PPO | Attending: Neurology

## 2021-02-08 DIAGNOSIS — R2689 Other abnormalities of gait and mobility: Secondary | ICD-10-CM | POA: Insufficient documentation

## 2021-02-08 DIAGNOSIS — M6281 Muscle weakness (generalized): Secondary | ICD-10-CM | POA: Insufficient documentation

## 2021-02-08 DIAGNOSIS — R2681 Unsteadiness on feet: Secondary | ICD-10-CM | POA: Diagnosis not present

## 2021-02-08 DIAGNOSIS — R262 Difficulty in walking, not elsewhere classified: Secondary | ICD-10-CM | POA: Diagnosis not present

## 2021-02-08 NOTE — Therapy (Signed)
[facility] MAIN [facility] SERVICES [address] [place], [place], [address] Phone: [phone]   Fax:  [phone]  Physical Therapy Evaluation  Patient Details  Name: Chiquita Heckert Mosely MRN: [id number] Date of Birth: [birth year] Referring Provider (PT): Stephanie Acre MD  Encounter Date: 02/08/2021   PT End of Session - 02/08/21 1503     Visit Number 1    Number of Visits 24    Date for PT Re-Evaluation 05/03/21    Authorization Type 1/10 eval 02/08/21    PT Start Time 1015    PT Stop Time 1110    PT Time Calculation (min) 55 min    Equipment Utilized During Treatment Gait belt    Activity Tolerance Patient tolerated treatment well    Behavior During Therapy [facility] for tasks assessed/performed             Past Medical History:  Diagnosis Date   Abdominal pain, right upper quadrant    Back pain    Calculus of kidney 12/09/2013   Chronic back pain    unspecified   Chronic left shoulder pain 07/19/2015   Complication of anesthesia    Functional disorder of bladder    other   Galactorrhea 11/26/2014   Chronic    Hereditary and idiopathic neuropathy 08/19/2013   HPV test positive    Hypercholesteremia 08/19/2013   Hypertension    Incomplete bladder emptying    Microscopic hematuria    MS (multiple sclerosis) (HCC)    Muscle spasticity 05/21/2014   Nonspecific findings on examination of urine    other   Osteopenia    PONV (postoperative nausea and vomiting)    Status post laparoscopic supracervical hysterectomy 11/26/2014   Tobacco user 11/26/2014   Wrist fracture     Past Surgical History:  Procedure Laterality Date   bilateral tubal ligation  1996   BREAST CYST EXCISION Left 2002   FRACTURE SURGERY     KNEE SURGERY     right   LAPAROSCOPIC SUPRACERVICAL HYSTERECTOMY  08/05/2013   ORIF WRIST FRACTURE Left 01/17/2017   Procedure: OPEN REDUCTION INTERNAL FIXATION (ORIF) WRIST FRACTURE;  Surgeon: Lyndle Herrlich, MD;  Location:  [facility] ORS;  Service: Orthopedics;  Laterality: Left;   RADIOLOGY WITH ANESTHESIA N/A 03/18/2020   Procedure: MRI WITH ANESTHESIA CERVICAL SPINE AND BRAIN  WITH AND WITHOUT CONTRAST;  Surgeon: Radiologist, Medication, MD;  Location: [facility] OR;  Service: Radiology;  Laterality: N/A;   TUBAL LIGATION Bilateral    VAGINAL HYSTERECTOMY  03/2006    There were no vitals filed for this visit.    Subjective Assessment - 02/08/21 1024     Subjective Patient is a pleasant 60 year old female who is returning to PT after MS exacerbation and hospitalization for COVID.    Pertinent History Patient is a pleasant 60 year old female who is returning to PT after MS exacerbation and hospitalization for COVID. Patient went to SNF for one month and then received home health PT for a month (8 visits).  PMH includes HTN, SVT, neurogenic bowel, MS (1995), neuropathy, hx of wrist fx, HPV, Hypercholesteremia, adiposity, osteopenia, spasticity. She wears bilateral AFOs and uses a RW and/or wheelchair for mobility. She drives an adapted car.    Limitations Lifting;Standing;Walking;House hold activities    How long can you sit comfortably? n/a    How long can you stand comfortably? 2 minutes    How long can you walk comfortably? 40 ft in house  Patient Stated Goals to return to PLOF prior to hospitalization    Currently in Pain? Yes    Pain Score 4     Pain Location Shoulder    Pain Orientation Left    Pain Descriptors / Indicators Aching    Pain Type Chronic pain    Pain Onset More than a month ago    Pain Frequency Constant    Aggravating Factors  walking and transfers    Pain Relieving Factors rest, ice                [facility] PT Assessment - 02/08/21 0001       Assessment   Medical Diagnosis MS    Referring Provider (PT) Stephanie Acre MD    Onset Date/Surgical Date 08/22/17    Hand Dominance Right    Prior Therapy Yes      Precautions   Precautions Fall    Required Braces or Orthoses Other  Brace/Splint    Other Brace/Splint bilateral AFOs      Restrictions   Weight Bearing Restrictions No      Balance Screen   Has the patient fallen in the past 6 months No    Has the patient had a decrease in activity level because of a fear of falling?  Yes    Is the patient reluctant to leave their home because of a fear of falling?  No      Home Environment   Living Environment Private residence    Living Arrangements Other relatives    Available Help at Discharge Family;Available PRN/intermittently    Type of Home House    Home Access Level entry    Home Layout Able to live on main level with bedroom/bathroom    Home Equipment Walker - 2 wheels;Wheelchair - manual;Grab bars - tub/shower;Shower seat      Prior Function   Level of Independence Independent with household mobility with device;Needs assistance with homemaking    Vocation Retired    Leisure shopping, traveling, spending time with family.      Cognition   Overall Cognitive Status Within Functional Limits for tasks assessed      Observation/Other Assessments   Focus on Therapeutic Outcomes (FOTO)  42                PAIN: L shoulder: worst 8/10 L shoulder current: 4/10  Worse first thing in the morning.   POSTURE: Sitting: slightly rounded posture, slight posterior pelvic tilt.  Standing: stands with BRW; wide BOS, R foot forward , trunk flexion, bilateral internal rotation with adduction of femurs and foot eversion.     STRENGTH:  Graded on a 0-5 scale Muscle Group Left Right  Hip Flex 1/5 1/5  Hip Abd 2/5 2/5  Hip Add 2/5 2/5  Hip Ext 1+/5 1+/5  Knee Flex 2-/5 2-/5  Knee Ext 2+/5 2+/5  Ankle DF 2-/5 2-/5  Ankle PF 1/5 1/5   SENSATION:  BUE :  BLE :   NEUROLOGICAL SCREEN: (2+ unless otherwise noted.) N=normal  Ab=abnormal   Level Dermatome R L  L2 Medial thigh/groin N N  L3 Lower thigh/med.knee Ab Ab  L4 Medial leg/lat thigh Ab Ab  L5 Lat. leg & dorsal foot Ab Ab  S1 post/lat  foot/thigh/leg N N  S2 Post./med. thigh & leg N N    SOMATOSENSORY:  Any N & T in extremities or weakness: reports :         Sensation  Intact      Diminished         Absent  Light touch LEs                              COORDINATION: Finger to Nose: Dysmetric with LUE normal with RUE         Heel Shin Slide Test: unable to perform due to muscle strength/weakness      FUNCTIONAL MOBILITY: STS: heavy BUE support required; BLE slight internal rotation noted bilaterally. Use of RW and arm rest required  BALANCE: Static Sitting Balance  Normal Able to maintain balance against maximal resistance   Good Able to maintain balance against moderate resistance   Good-/Fair+ Accepts minimal resistance   Fair Able to sit unsupported without balance loss and without UE support x  Poor+ Able to maintain with Minimal assistance from individual or chair   Poor Unable to maintain balance-requires mod/max support from individual or chair    Static Standing Balance  Normal Able to maintain standing balance against maximal resistance   Good Able to maintain standing balance against moderate resistance   Good-/Fair+ Able to maintain standing balance against minimal resistance   Fair Able to stand unsupported without UE support and without LOB for 1-2 min   Fair- Requires Min A and UE support to maintain standing without loss of balance x  Poor+ Requires mod A and UE support to maintain standing without loss of balance   Poor Requires max A and UE support to maintain standing balance without loss    Dynamic Sitting Balance  Normal Able to sit unsupported and weight shift across midline maximally   Good Able to sit unsupported and weight shift across midline moderately   Good-/Fair+ Able to sit unsupported and weight shift across midline minimally   Fair Minimal weight shifting ipsilateral/front, difficulty crossing midline x  Fair- Reach to ipsilateral side and unable to weight shift    Poor + Able to sit unsupported with min A and reach to ipsilateral side, unable to weight shift   Poor Able to sit unsupported with mod A and reach ipsilateral/front-can't cross midline    Standing Dynamic Balance  Normal Stand independently unsupported, able to weight shift and cross midline maximally   Good Stand independently unsupported, able to weight shift and cross midline moderately   Good-/Fair+ Stand independently unsupported, able to weight shift across midline minimally   Fair Stand independently unsupported, weight shift, and reach ipsilaterally, loss of balance when crossing midline   Poor+ Able to stand with Min A and reach ipsilaterally, unable to weight shift x  Poor Able to stand with Mod A and minimally reach ipsilaterally, unable to cross midline.      GAIT: Ambulates with BRW with limited knee flexion bilaterally and limited hip flexion bilaterally.   OUTCOME MEASURES: TEST Outcome Interpretation  5 times sit<>stand 33.15 sec heavy BUE support  >37 yo, >15 sec indicates increased risk for falls  10 meter walk test     43.17 seconds            m/s <1.0 m/s indicates increased risk for falls; limited community ambulator  Timed up and Go          52.66       sec with RW <14 sec indicates increased risk for falls          FOTO 42 Predicted discharge 51%  Access Code: [email or web address] URL: [email or web address] Date: 02/08/2021 Prepared by: Precious Bard  Exercises Seated Long Arc Quad - 1 x daily - 7 x weekly - 2 sets - 10 reps - 5 hold Seated Hip Adduction Isometrics with Ball - 1 x daily - 7 x weekly - 2 sets - 10 reps - 5 hold Seated Hip Abduction with Resistance - 1 x daily - 7 x weekly - 2 sets - 10 reps - 5 hold Seated Sidebending - 1 x daily - 7 x weekly - 2 sets - 10 reps - 5 hold Seated Gluteal Sets - 1 x daily - 7 x weekly - 2 sets - 10 reps - 5 hold    Objective measurements completed on examination: See above findings.    Patient is a pleasant 60 year old female who presents to physical therapy for evaluation s/p hospitalization. Patient has been attending therapy at this clinic prior to hospitalization for her MS; however when assessing her mobility and strength compared to prior to hospitalization for COVID significant strength and mobility lapse is noted. Patient has reverted her ambulation to a primarily UE dominated movement patterning. Patient given HEP and demonstrates understanding. The patient would benefit from continued skilled PT intervention to maximize QOL, safety, and function             PT Education - 02/08/21 1501     Education provided Yes    Education Details goals, POC, HEP    Person(s) Educated Patient    Methods Explanation;Demonstration;Tactile cues;Verbal cues;Handout    Comprehension Verbalized understanding;Returned demonstration;Verbal cues required;Tactile cues required              PT Short Term Goals - 02/08/21 1805       PT SHORT TERM GOAL #1   Title Patient will be independent in home exercise program to improve strength/mobility for better functional independence with ADLs.    Baseline 10/4 HEP provided    Time 4    Period Weeks    Status New    Target Date 03/08/21               PT Long Term Goals - 02/08/21 0001       PT LONG TERM GOAL #1   Title Patient will increase FOTO score to equal to or greater than  51/100   to demonstrate statistically significant improvement in mobility and quality of life.    Baseline 10/4: 42%    Time 12    Period Weeks    Status New    Target Date 05/03/21      PT LONG TERM GOAL #2   Title Patient (< 40 years old) will complete five times sit to stand test in < 20 seconds indicating an increased LE strength and improved balance.    Baseline 10/4: 33.15 seconds with heavy BUE support    Time 12    Period Weeks    Status New    Target Date 05/03/21      PT LONG TERM GOAL #3   Title Patient will increase  10 meter walk test to <20 seconds as to improve gait speed for better community ambulation and to reduce fall risk.    Baseline 10/4: 43.17 seconds with RW    Time 12    Period Weeks    Status New    Target Date 05/03/21      PT LONG TERM GOAL #4   Title Patient will reduce timed up and go to <11  seconds to reduce fall risk and demonstrate improved transfer/gait ability.    Baseline 10/4: 52.66 seconds with RW    Time 12    Period Weeks    Status New    Target Date 05/03/21                    Plan - 02/08/21 1521     Clinical Impression Statement Patient is a pleasant 60 year old female who presents to physical therapy for evaluation s/p hospitalization. Patient has been attending therapy at this clinic prior to hospitalization for her MS; however when assessing her mobility and strength compared to prior to hospitalization for COVID significant strength and mobility lapse is noted. Patient has reverted her ambulation to a primarily UE dominated movement patterning. Patient given HEP and demonstrates understanding. The patient would benefit from continued skilled PT intervention to maximize QOL, safety, and function    Personal Factors and Comorbidities Age;Comorbidity 3+;Finances;Fitness;Past/Current Experience;Sex;Social Background;Time since onset of injury/illness/exacerbation;Transportation    Comorbidities HTN, SVT, neurogenic bowel, MS (1995), neuropathy, hx of wrist fx, HPV, Hypercholesteremia, adiposity, osteopenia, spasticity.    Examination-Activity Limitations Bathing;Bed Mobility;Bend;Carry;Continence;Dressing;Hygiene/Grooming;Stairs;Squat;Reach Overhead;Locomotion Level;Lift;Stand;Transfers;Toileting    Examination-Participation Restrictions Church;Cleaning;Community Activity;Laundry;Volunteer;Shop;Meal Prep;Yard Work    Conservation officer, historic buildings Evolving/Moderate complexity    Clinical Decision Making Moderate    Rehab Potential Good    PT Frequency 2x /  week    PT Duration 12 weeks    PT Treatment/Interventions ADLs/Self Care Home Management;Aquatic Therapy;Biofeedback;Cryotherapy;Electrical Stimulation;Iontophoresis 4mg /ml Dexamethasone;Traction;Moist Heat;Ultrasound;DME Instruction;Gait training;Stair training;Functional mobility training;Therapeutic activities;Therapeutic exercise;Balance training;Neuromuscular re-education;Patient/family education;Orthotic Fit/Training;Wheelchair mobility training;Manual techniques;Compression bandaging;Passive range of motion;Dry needling;Energy conservation;Taping;Splinting;Vestibular    PT Next Visit Plan in // bars weight shift and LE strengthening    PT Home Exercise Plan HEP given    Consulted and Agree with Plan of Care Patient             Patient will benefit from skilled therapeutic intervention in order to improve the following deficits and impairments:  Abnormal gait, Decreased activity tolerance, Decreased balance, Decreased coordination, Decreased endurance, Decreased mobility, Decreased range of motion, Decreased strength, Difficulty walking, Impaired perceived functional ability, Impaired sensation, Impaired tone, Impaired UE functional use, Improper body mechanics, Postural dysfunction, Cardiopulmonary status limiting activity, Impaired flexibility, Increased muscle spasms  Visit Diagnosis: Muscle weakness (generalized)  Unsteadiness on feet  Other abnormalities of gait and mobility     Problem List Patient Active Problem List   Diagnosis Date Noted   Acute respiratory disease due to COVID-19 virus 11/21/2020   Weakness    Hypoalbuminemia due to protein-calorie malnutrition (HCC)    Neurogenic bowel    Neurogenic bladder    Labile blood pressure    Neuropathic pain    Abscess of female pelvis    SVT (supraventricular tachycardia) (HCC)    Radial styloid tenosynovitis 03/12/2018   Wheelchair confinement 02/27/2018   Localized osteoporosis with current pathological fracture  with routine healing 01/19/2017   Wrist fracture 01/16/2017   Sprain of ankle 03/23/2016   Closed fracture of lateral malleolus 03/16/2016   Health care maintenance 01/24/2016   Blood pressure elevated without history of HTN 10/25/2015   Essential hypertension 10/25/2015   Multiple sclerosis (HCC) 10/02/2015   Chronic left shoulder pain 07/19/2015   Multiple sclerosis exacerbation (HCC) 07/14/2015   MS (multiple sclerosis) (HCC) 11/26/2014   Increased body mass index 11/26/2014   HPV test positive 11/26/2014   Status post laparoscopic supracervical hysterectomy 11/26/2014   Galactorrhea 11/26/2014   Back ache  05/21/2014   Adiposity 05/21/2014   Disordered sleep 05/21/2014   Muscle spasticity 05/21/2014   Spasticity 05/21/2014   Calculus of kidney 12/09/2013   Renal colic 12/09/2013   Hypercholesteremia 08/19/2013   Hereditary and idiopathic neuropathy 08/19/2013   Hypercholesterolemia without hypertriglyceridemia 08/19/2013   Bladder infection, chronic 07/25/2012   Disorder of bladder function 07/25/2012   Incomplete bladder emptying 07/25/2012   Microscopic hematuria 07/25/2012   Right upper quadrant pain 07/25/2012    Precious Bard, PT, DPT  02/08/2021, 6:09 PM  [facility] MAIN [facility] SERVICES [address] [place], [place], [address] Phone: [phone]   Fax:  [phone]  Name: Chaela Branscum Mullane MRN: [id number] Date of Birth: [birth year]

## 2021-02-08 NOTE — Patient Instructions (Signed)
  Access Code: [email or web address] URL: [email or web address] Date: 02/08/2021 Prepared by: Precious Bard  Exercises Seated Long Arc Quad - 1 x daily - 7 x weekly - 2 sets - 10 reps - 5 hold Seated Hip Adduction Isometrics with Ball - 1 x daily - 7 x weekly - 2 sets - 10 reps - 5 hold Seated Hip Abduction with Resistance - 1 x daily - 7 x weekly - 2 sets - 10 reps - 5 hold Seated Sidebending - 1 x daily - 7 x weekly - 2 sets - 10 reps - 5 hold Seated Gluteal Sets - 1 x daily - 7 x weekly - 2 sets - 10 reps - 5 hold

## 2021-02-10 ENCOUNTER — Ambulatory Visit: Payer: PPO

## 2021-02-10 ENCOUNTER — Other Ambulatory Visit: Payer: Self-pay

## 2021-02-10 DIAGNOSIS — M6281 Muscle weakness (generalized): Secondary | ICD-10-CM

## 2021-02-10 DIAGNOSIS — R2689 Other abnormalities of gait and mobility: Secondary | ICD-10-CM

## 2021-02-10 DIAGNOSIS — R2681 Unsteadiness on feet: Secondary | ICD-10-CM

## 2021-02-10 NOTE — Therapy (Signed)
[facility] MAIN [facility] SERVICES [address] [place], [place], [address] Phone: [phone]   Fax:  [phone]  Physical Therapy Treatment  Patient Details  Name: Lisa Williams MRN: [id number] Date of Birth: [birth year] Referring Provider (PT): Stephanie Acre MD   Encounter Date: 02/10/2021   PT End of Session - 02/10/21 1112     Visit Number 2    Number of Visits 24    Date for PT Re-Evaluation 05/03/21    Authorization Type 2/10 eval 02/08/21    PT Start Time 1015    PT Stop Time 1100    PT Time Calculation (min) 45 min    Equipment Utilized During Treatment Gait belt    Activity Tolerance Patient tolerated treatment well    Behavior During Therapy [facility] for tasks assessed/performed             Past Medical History:  Diagnosis Date   Abdominal pain, right upper quadrant    Back pain    Calculus of kidney 12/09/2013   Chronic back pain    unspecified   Chronic left shoulder pain 07/19/2015   Complication of anesthesia    Functional disorder of bladder    other   Galactorrhea 11/26/2014   Chronic    Hereditary and idiopathic neuropathy 08/19/2013   HPV test positive    Hypercholesteremia 08/19/2013   Hypertension    Incomplete bladder emptying    Microscopic hematuria    MS (multiple sclerosis) (HCC)    Muscle spasticity 05/21/2014   Nonspecific findings on examination of urine    other   Osteopenia    PONV (postoperative nausea and vomiting)    Status post laparoscopic supracervical hysterectomy 11/26/2014   Tobacco user 11/26/2014   Wrist fracture     Past Surgical History:  Procedure Laterality Date   bilateral tubal ligation  1996   BREAST CYST EXCISION Left 2002   FRACTURE SURGERY     KNEE SURGERY     right   LAPAROSCOPIC SUPRACERVICAL HYSTERECTOMY  08/05/2013   ORIF WRIST FRACTURE Left 01/17/2017   Procedure: OPEN REDUCTION INTERNAL FIXATION (ORIF) WRIST FRACTURE;  Surgeon: Lyndle Herrlich, MD;  Location:  [facility] ORS;  Service: Orthopedics;  Laterality: Left;   RADIOLOGY WITH ANESTHESIA N/A 03/18/2020   Procedure: MRI WITH ANESTHESIA CERVICAL SPINE AND BRAIN  WITH AND WITHOUT CONTRAST;  Surgeon: Radiologist, Medication, MD;  Location: [facility] OR;  Service: Radiology;  Laterality: N/A;   TUBAL LIGATION Bilateral    VAGINAL HYSTERECTOMY  03/2006    There were no vitals filed for this visit.   Subjective Assessment - 02/10/21 1111     Subjective Patient is eager to return to therapy. Reports mild spasms in sides of trunk but no pain. No falls since evaluation.    Pertinent History Patient is a pleasant 60 year old female who is returning to PT after MS exacerbation and hospitalization for COVID. Patient went to SNF for one month and then received home health PT for a month (8 visits).  PMH includes HTN, SVT, neurogenic bowel, MS (1995), neuropathy, hx of wrist fx, HPV, Hypercholesteremia, adiposity, osteopenia, spasticity. She wears bilateral AFOs and uses a RW and/or wheelchair for mobility. She drives an adapted car.    Limitations Lifting;Standing;Walking;House hold activities    How long can you sit comfortably? n/a    How long can you stand comfortably? 2 minutes    How long can you walk comfortably? 40 ft in house  Patient Stated Goals to return to PLOF prior to hospitalization    Currently in Pain? No/denies                        Standing in // bars:  -Standing weight shifts 2x10 each side; UE support focus on hip shift rather than pull with arm -alternating hand clap across body 12x each hand, no LOB -modified tandem stance hold 30 seconds x each Le -forward stepping/backwards stepping excessive trunk flexion; 2x length of // bars  -knee flexion into theraband strung across // bars with focus on dissociating L and RLE 8x each LE   Seated:  Isometric contraction into PT hand: 10x 5 second holds with PT cueing patient for breathing and controlled contraction -adduction,  abduction, flexion Modified windmills 10x each UE    Ice pack donned throughout session to minimize exacerbation of MS symptoms in order to perform interventions.      Pt educated throughout session about proper posture and technique with exercises. Improved exercise technique, movement at target joints, use of target muscles after min to mod verbal, visual, tactile cues    Patient is highly motivated throughout physical therapy session. She is able to tolerate static standing as well as upper extremity reaches in standing position. Isometric contractions are challenging to maintain steady contraction against PT resistance. Increased trunk flexion with LE fatigue noted in standing position. The patient would benefit from continued skilled PT intervention to maximize QOL, safety, and function                  PT Education - 02/10/21 1112     Education provided Yes    Education Details exercise technique, body mechanics    Person(s) Educated Patient    Methods Explanation;Demonstration;Tactile cues;Verbal cues    Comprehension Verbalized understanding;Returned demonstration;Verbal cues required;Tactile cues required              PT Short Term Goals - 02/08/21 1805       PT SHORT TERM GOAL #1   Title Patient will be independent in home exercise program to improve strength/mobility for better functional independence with ADLs.    Baseline 10/4 HEP provided    Time 4    Period Weeks    Status New    Target Date 03/08/21               PT Long Term Goals - 02/08/21 0001       PT LONG TERM GOAL #1   Title Patient will increase FOTO score to equal to or greater than  51/100   to demonstrate statistically significant improvement in mobility and quality of life.    Baseline 10/4: 42%    Time 12    Period Weeks    Status New    Target Date 05/03/21      PT LONG TERM GOAL #2   Title Patient (< 52 years old) will complete five times sit to stand test in < 20  seconds indicating an increased LE strength and improved balance.    Baseline 10/4: 33.15 seconds with heavy BUE support    Time 12    Period Weeks    Status New    Target Date 05/03/21      PT LONG TERM GOAL #3   Title Patient will increase 10 meter walk test to <20 seconds as to improve gait speed for better community ambulation and to reduce fall risk.    Baseline  10/4: 43.17 seconds with RW    Time 12    Period Weeks    Status New    Target Date 05/03/21      PT LONG TERM GOAL #4   Title Patient will reduce timed up and go to <11 seconds to reduce fall risk and demonstrate improved transfer/gait ability.    Baseline 10/4: 52.66 seconds with RW    Time 12    Period Weeks    Status New    Target Date 05/03/21                   Plan - 02/10/21 1113     Clinical Impression Statement Patient is highly motivated throughout physical therapy session. She is able to tolerate static standing as well as upper extremity reaches in standing position. Isometric contractions are challenging to maintain steady contraction against PT resistance. Increased trunk flexion with LE fatigue noted in standing position. The patient would benefit from continued skilled PT intervention to maximize QOL, safety, and function    Personal Factors and Comorbidities Age;Comorbidity 3+;Finances;Fitness;Past/Current Experience;Sex;Social Background;Time since onset of injury/illness/exacerbation;Transportation    Comorbidities HTN, SVT, neurogenic bowel, MS (1995), neuropathy, hx of wrist fx, HPV, Hypercholesteremia, adiposity, osteopenia, spasticity.    Examination-Activity Limitations Bathing;Bed Mobility;Bend;Carry;Continence;Dressing;Hygiene/Grooming;Stairs;Squat;Reach Overhead;Locomotion Level;Lift;Stand;Transfers;Toileting    Examination-Participation Restrictions Church;Cleaning;Community Activity;Laundry;Volunteer;Shop;Meal Prep;Yard Work    Conservation officer, historic buildings Evolving/Moderate  complexity    Rehab Potential Good    PT Frequency 2x / week    PT Duration 12 weeks    PT Treatment/Interventions ADLs/Self Care Home Management;Aquatic Therapy;Biofeedback;Cryotherapy;Electrical Stimulation;Iontophoresis 4mg /ml Dexamethasone;Traction;Moist Heat;Ultrasound;DME Instruction;Gait training;Stair training;Functional mobility training;Therapeutic activities;Therapeutic exercise;Balance training;Neuromuscular re-education;Patient/family education;Orthotic Fit/Training;Wheelchair mobility training;Manual techniques;Compression bandaging;Passive range of motion;Dry needling;Energy conservation;Taping;Splinting;Vestibular    PT Next Visit Plan in // bars weight shift and LE strengthening    PT Home Exercise Plan HEP given    Consulted and Agree with Plan of Care Patient             Patient will benefit from skilled therapeutic intervention in order to improve the following deficits and impairments:  Abnormal gait, Decreased activity tolerance, Decreased balance, Decreased coordination, Decreased endurance, Decreased mobility, Decreased range of motion, Decreased strength, Difficulty walking, Impaired perceived functional ability, Impaired sensation, Impaired tone, Impaired UE functional use, Improper body mechanics, Postural dysfunction, Cardiopulmonary status limiting activity, Impaired flexibility, Increased muscle spasms  Visit Diagnosis: Muscle weakness (generalized)  Unsteadiness on feet  Other abnormalities of gait and mobility     Problem List Patient Active Problem List   Diagnosis Date Noted   Acute respiratory disease due to COVID-19 virus 11/21/2020   Weakness    Hypoalbuminemia due to protein-calorie malnutrition (HCC)    Neurogenic bowel    Neurogenic bladder    Labile blood pressure    Neuropathic pain    Abscess of female pelvis    SVT (supraventricular tachycardia) (HCC)    Radial styloid tenosynovitis 03/12/2018   Wheelchair confinement 02/27/2018    Localized osteoporosis with current pathological fracture with routine healing 01/19/2017   Wrist fracture 01/16/2017   Sprain of ankle 03/23/2016   Closed fracture of lateral malleolus 03/16/2016   Health care maintenance 01/24/2016   Blood pressure elevated without history of HTN 10/25/2015   Essential hypertension 10/25/2015   Multiple sclerosis (HCC) 10/02/2015   Chronic left shoulder pain 07/19/2015   Multiple sclerosis exacerbation (HCC) 07/14/2015   MS (multiple sclerosis) (HCC) 11/26/2014   Increased body mass index 11/26/2014   HPV test positive 11/26/2014  Status post laparoscopic supracervical hysterectomy 11/26/2014   Galactorrhea 11/26/2014   Back ache 05/21/2014   Adiposity 05/21/2014   Disordered sleep 05/21/2014   Muscle spasticity 05/21/2014   Spasticity 05/21/2014   Calculus of kidney 12/09/2013   Renal colic 12/09/2013   Hypercholesteremia 08/19/2013   Hereditary and idiopathic neuropathy 08/19/2013   Hypercholesterolemia without hypertriglyceridemia 08/19/2013   Bladder infection, chronic 07/25/2012   Disorder of bladder function 07/25/2012   Incomplete bladder emptying 07/25/2012   Microscopic hematuria 07/25/2012   Right upper quadrant pain 07/25/2012   Precious Bard, PT, DPT  02/10/2021, 11:15 AM  [facility] [facility] MAIN [facility] SERVICES [address] [place], [place], [address] Phone: [phone]   Fax:  [phone]  Name: Lisa Williams MRN: [id number] Date of Birth: 1960/11/04

## 2021-02-15 ENCOUNTER — Ambulatory Visit: Payer: PPO

## 2021-02-15 ENCOUNTER — Other Ambulatory Visit: Payer: Self-pay

## 2021-02-15 DIAGNOSIS — M6281 Muscle weakness (generalized): Secondary | ICD-10-CM

## 2021-02-15 DIAGNOSIS — R2681 Unsteadiness on feet: Secondary | ICD-10-CM

## 2021-02-15 DIAGNOSIS — R2689 Other abnormalities of gait and mobility: Secondary | ICD-10-CM

## 2021-02-15 NOTE — Therapy (Signed)
[place] [facility] MAIN [facility] SERVICES [address] [place], [place], [address] Phone: [phone]   Fax:  [phone]  Physical Therapy Treatment  Patient Details  Name: Lisa Williams MRN: [id number] Date of Birth: 1961-01-23 Referring Provider (PT): Stephanie Acre MD   Encounter Date: 02/15/2021   PT End of Session - 02/15/21 1547     Visit Number 3    Number of Visits 24    Date for PT Re-Evaluation 05/03/21    Authorization Type 3/10 eval 02/08/21    PT Start Time 1015    PT Stop Time 1059    PT Time Calculation (min) 44 min    Equipment Utilized During Treatment Gait belt    Activity Tolerance Patient tolerated treatment well    Behavior During Therapy [facility] for tasks assessed/performed             Past Medical History:  Diagnosis Date   Abdominal pain, right upper quadrant    Back pain    Calculus of kidney 12/09/2013   Chronic back pain    unspecified   Chronic left shoulder pain 07/19/2015   Complication of anesthesia    Functional disorder of bladder    other   Galactorrhea 11/26/2014   Chronic    Hereditary and idiopathic neuropathy 08/19/2013   HPV test positive    Hypercholesteremia 08/19/2013   Hypertension    Incomplete bladder emptying    Microscopic hematuria    MS (multiple sclerosis) (HCC)    Muscle spasticity 05/21/2014   Nonspecific findings on examination of urine    other   Osteopenia    PONV (postoperative nausea and vomiting)    Status post laparoscopic supracervical hysterectomy 11/26/2014   Tobacco user 11/26/2014   Wrist fracture     Past Surgical History:  Procedure Laterality Date   bilateral tubal ligation  1996   BREAST CYST EXCISION Left 2002   FRACTURE SURGERY     KNEE SURGERY     right   LAPAROSCOPIC SUPRACERVICAL HYSTERECTOMY  08/05/2013   ORIF WRIST FRACTURE Left 01/17/2017   Procedure: OPEN REDUCTION INTERNAL FIXATION (ORIF) WRIST FRACTURE;  Surgeon: Lyndle Herrlich, MD;   Location: [facility] ORS;  Service: Orthopedics;  Laterality: Left;   RADIOLOGY WITH ANESTHESIA N/A 03/18/2020   Procedure: MRI WITH ANESTHESIA CERVICAL SPINE AND BRAIN  WITH AND WITHOUT CONTRAST;  Surgeon: Radiologist, Medication, MD;  Location: [facility] OR;  Service: Radiology;  Laterality: N/A;   TUBAL LIGATION Bilateral    VAGINAL HYSTERECTOMY  03/2006    There were no vitals filed for this visit.   Subjective Assessment - 02/15/21 1535     Subjective Patient reports no falls or LOB since last session. Has been compliant with HEP. Was able to go shopping over the weekend for a brief amount of time.    Pertinent History Patient is a pleasant 60 year old female who is returning to PT after MS exacerbation and hospitalization for COVID. Patient went to SNF for one month and then received home health PT for a month (8 visits).  PMH includes HTN, SVT, neurogenic bowel, MS (1995), neuropathy, hx of wrist fx, HPV, Hypercholesteremia, adiposity, osteopenia, spasticity. She wears bilateral AFOs and uses a RW and/or wheelchair for mobility. She drives an adapted car.    Limitations Lifting;Standing;Walking;House hold activities    How long can you sit comfortably? n/a    How long can you stand comfortably? 2 minutes    How long can you walk  comfortably? 40 ft in house    Patient Stated Goals to return to PLOF prior to hospitalization    Currently in Pain? No/denies                Standing:  ambulate 121 ft with two rest breaks with BRW and wheelchair follow. Patient required min VCs for correct technique including to increase core stabilization to facilitate better hip flexion to advance limb forward. Patient demonstrates inc reliance on BUE by end of gait trial. She started exhibiting increased circumduction with LLE towards end of distance with increased fatigue reported.  5xSTS; x 2 trials, modified sit to stand with heavy UE support required    Seated: RTB row 15x RTB ER 15x   Clockwise/counterclockwise circles 10x each LE  Pectoral opening stretch 10x   Ice pack donned throughout session to minimize exacerbation of MS symptoms in order to perform interventions.    Pt educated throughout session about proper posture and technique with exercises. Improved exercise technique, movement at target joints, use of target muscles after min to mod verbal, visual, tactile cues.    Patient is able to tolerated progressive ambulation with two seated rest breaks. She is able to ambulate 121 ft with RW this session. She is able to perform modified sit to stands at end of session with extreme fatigue. She is highly motivated throughout physical therapy session and has a good prognosis. The patient would benefit from continued skilled PT intervention to maximize QOL, safety, and function                    PT Education - 02/15/21 1536     Education provided Yes    Education Details exercise technique, body mechanics    Person(s) Educated Patient    Methods Explanation;Demonstration;Tactile cues;Verbal cues    Comprehension Verbalized understanding;Returned demonstration;Verbal cues required;Tactile cues required              PT Short Term Goals - 02/08/21 1805       PT SHORT TERM GOAL #1   Title Patient will be independent in home exercise program to improve strength/mobility for better functional independence with ADLs.    Baseline 10/4 HEP provided    Time 4    Period Weeks    Status New    Target Date 03/08/21               PT Long Term Goals - 02/08/21 0001       PT LONG TERM GOAL #1   Title Patient will increase FOTO score to equal to or greater than  51/100   to demonstrate statistically significant improvement in mobility and quality of life.    Baseline 10/4: 42%    Time 12    Period Weeks    Status New    Target Date 05/03/21      PT LONG TERM GOAL #2   Title Patient (< 39 years old) will complete five times sit to stand  test in < 20 seconds indicating an increased LE strength and improved balance.    Baseline 10/4: 33.15 seconds with heavy BUE support    Time 12    Period Weeks    Status New    Target Date 05/03/21      PT LONG TERM GOAL #3   Title Patient will increase 10 meter walk test to <20 seconds as to improve gait speed for better community ambulation and to reduce fall risk.  Baseline 10/4: 43.17 seconds with RW    Time 12    Period Weeks    Status New    Target Date 05/03/21      PT LONG TERM GOAL #4   Title Patient will reduce timed up and go to <11 seconds to reduce fall risk and demonstrate improved transfer/gait ability.    Baseline 10/4: 52.66 seconds with RW    Time 12    Period Weeks    Status New    Target Date 05/03/21                   Plan - 02/15/21 1602     Clinical Impression Statement Patient is able to tolerated progressive ambulation with two seated rest breaks. She is able to ambulate 121 ft with RW this session. She is able to perform modified sit to stands at end of session with extreme fatigue. She is highly motivated throughout physical therapy session and has a good prognosis. The patient would benefit from continued skilled PT intervention to maximize QOL, safety, and function    Personal Factors and Comorbidities Age;Comorbidity 3+;Finances;Fitness;Past/Current Experience;Sex;Social Background;Time since onset of injury/illness/exacerbation;Transportation    Comorbidities HTN, SVT, neurogenic bowel, MS (1995), neuropathy, hx of wrist fx, HPV, Hypercholesteremia, adiposity, osteopenia, spasticity.    Examination-Activity Limitations Bathing;Bed Mobility;Bend;Carry;Continence;Dressing;Hygiene/Grooming;Stairs;Squat;Reach Overhead;Locomotion Level;Lift;Stand;Transfers;Toileting    Examination-Participation Restrictions Church;Cleaning;Community Activity;Laundry;Volunteer;Shop;Meal Prep;Yard Work    Conservation officer, historic buildings Evolving/Moderate  complexity    Rehab Potential Good    PT Frequency 2x / week    PT Duration 12 weeks    PT Treatment/Interventions ADLs/Self Care Home Management;Aquatic Therapy;Biofeedback;Cryotherapy;Electrical Stimulation;Iontophoresis 4mg /ml Dexamethasone;Traction;Moist Heat;Ultrasound;DME Instruction;Gait training;Stair training;Functional mobility training;Therapeutic activities;Therapeutic exercise;Balance training;Neuromuscular re-education;Patient/family education;Orthotic Fit/Training;Wheelchair mobility training;Manual techniques;Compression bandaging;Passive range of motion;Dry needling;Energy conservation;Taping;Splinting;Vestibular    PT Next Visit Plan in // bars weight shift and LE strengthening    PT Home Exercise Plan HEP given    Consulted and Agree with Plan of Care Patient             Patient will benefit from skilled therapeutic intervention in order to improve the following deficits and impairments:  Abnormal gait, Decreased activity tolerance, Decreased balance, Decreased coordination, Decreased endurance, Decreased mobility, Decreased range of motion, Decreased strength, Difficulty walking, Impaired perceived functional ability, Impaired sensation, Impaired tone, Impaired UE functional use, Improper body mechanics, Postural dysfunction, Cardiopulmonary status limiting activity, Impaired flexibility, Increased muscle spasms  Visit Diagnosis: Muscle weakness (generalized)  Unsteadiness on feet  Other abnormalities of gait and mobility     Problem List Patient Active Problem List   Diagnosis Date Noted   Acute respiratory disease due to COVID-19 virus 11/21/2020   Weakness    Hypoalbuminemia due to protein-calorie malnutrition (HCC)    Neurogenic bowel    Neurogenic bladder    Labile blood pressure    Neuropathic pain    Abscess of female pelvis    SVT (supraventricular tachycardia) (HCC)    Radial styloid tenosynovitis 03/12/2018   Wheelchair confinement 02/27/2018    Localized osteoporosis with current pathological fracture with routine healing 01/19/2017   Wrist fracture 01/16/2017   Sprain of ankle 03/23/2016   Closed fracture of lateral malleolus 03/16/2016   Health care maintenance 01/24/2016   Blood pressure elevated without history of HTN 10/25/2015   Essential hypertension 10/25/2015   Multiple sclerosis (HCC) 10/02/2015   Chronic left shoulder pain 07/19/2015   Multiple sclerosis exacerbation (HCC) 07/14/2015   MS (multiple sclerosis) (HCC) 11/26/2014   Increased body mass index  11/26/2014   HPV test positive 11/26/2014   Status post laparoscopic supracervical hysterectomy 11/26/2014   Galactorrhea 11/26/2014   Back ache 05/21/2014   Adiposity 05/21/2014   Disordered sleep 05/21/2014   Muscle spasticity 05/21/2014   Spasticity 05/21/2014   Calculus of kidney 12/09/2013   Renal colic 12/09/2013   Hypercholesteremia 08/19/2013   Hereditary and idiopathic neuropathy 08/19/2013   Hypercholesterolemia without hypertriglyceridemia 08/19/2013   Bladder infection, chronic 07/25/2012   Disorder of bladder function 07/25/2012   Incomplete bladder emptying 07/25/2012   Microscopic hematuria 07/25/2012   Right upper quadrant pain 07/25/2012    Precious Bard, PT, DPT  02/15/2021, 4:04 PM  [facility] MAIN [facility] SERVICES [address] [place], [place], [address] Phone: [phone]   Fax:  [phone]  Name: Junella Domke Belloso MRN: [id number] Date of Birth: July 28, 1960

## 2021-02-16 ENCOUNTER — Telehealth: Payer: Self-pay | Admitting: Neurology

## 2021-02-16 MED ORDER — PREDNISONE 10 MG PO TABS: ORAL_TABLET | ORAL | 0 refills | Status: DC

## 2021-02-16 NOTE — Telephone Encounter (Signed)
Pt called, asking if Dr. Anne Hahn can prescribe predniSONE (DELTASONE) 10 MG tablet. Having MS flare up. Would like a call from the nurse.  Informed pt Dr. Anne Hahn did not prescribe the medication. She insisted to send a message the nurse.

## 2021-02-16 NOTE — Telephone Encounter (Signed)
Looks like pt got this medication from ED, was already informed you did not originally prescribe. Are you willing to prescribe?

## 2021-02-16 NOTE — Telephone Encounter (Signed)
I called the patient.  She claims that over the last 24 hours she has had some fatigue when she is walking.  She will sit down and has difficulty getting back up again.  She has noticed in the past this precedes some of her MS attacks.  I will send in a prescription for prednisone, but I asked her to wait another day or 2 to make sure that the symptoms are more persistent.  If they go away, she is not to take the prednisone.  She denies any symptoms of bladder infection, she is running no fever, no malaise, no night sweats.

## 2021-02-17 ENCOUNTER — Ambulatory Visit: Payer: PPO

## 2021-02-17 ENCOUNTER — Other Ambulatory Visit: Payer: Self-pay

## 2021-02-17 ENCOUNTER — Telehealth: Payer: Self-pay | Admitting: *Deleted

## 2021-02-17 DIAGNOSIS — R2689 Other abnormalities of gait and mobility: Secondary | ICD-10-CM

## 2021-02-17 DIAGNOSIS — M6281 Muscle weakness (generalized): Secondary | ICD-10-CM

## 2021-02-17 DIAGNOSIS — R2681 Unsteadiness on feet: Secondary | ICD-10-CM

## 2021-02-17 NOTE — Therapy (Signed)
[place] [facility] MAIN [facility] SERVICES [address] [place], [place], [address] Phone: [phone]   Fax:  [phone]  Physical Therapy Treatment  Patient Details  Name: Lisa Williams MRN: [id number] Date of Birth: 1961-03-13 Referring Provider (PT): Stephanie Acre MD   Encounter Date: 02/17/2021   PT End of Session - 02/17/21 1651     Visit Number 4    Number of Visits 24    Date for PT Re-Evaluation 05/03/21    Authorization Type 4/10 eval 02/08/21    PT Start Time 1015    PT Stop Time 1059    PT Time Calculation (min) 44 min    Equipment Utilized During Treatment Gait belt    Activity Tolerance Patient tolerated treatment well    Behavior During Therapy [facility] for tasks assessed/performed             Past Medical History:  Diagnosis Date   Abdominal pain, right upper quadrant    Back pain    Calculus of kidney 12/09/2013   Chronic back pain    unspecified   Chronic left shoulder pain 07/19/2015   Complication of anesthesia    Functional disorder of bladder    other   Galactorrhea 11/26/2014   Chronic    Hereditary and idiopathic neuropathy 08/19/2013   HPV test positive    Hypercholesteremia 08/19/2013   Hypertension    Incomplete bladder emptying    Microscopic hematuria    MS (multiple sclerosis) (HCC)    Muscle spasticity 05/21/2014   Nonspecific findings on examination of urine    other   Osteopenia    PONV (postoperative nausea and vomiting)    Status post laparoscopic supracervical hysterectomy 11/26/2014   Tobacco user 11/26/2014   Wrist fracture     Past Surgical History:  Procedure Laterality Date   bilateral tubal ligation  1996   BREAST CYST EXCISION Left 2002   FRACTURE SURGERY     KNEE SURGERY     right   LAPAROSCOPIC SUPRACERVICAL HYSTERECTOMY  08/05/2013   ORIF WRIST FRACTURE Left 01/17/2017   Procedure: OPEN REDUCTION INTERNAL FIXATION (ORIF) WRIST FRACTURE;  Surgeon: Lyndle Herrlich, MD;   Location: [facility] ORS;  Service: Orthopedics;  Laterality: Left;   RADIOLOGY WITH ANESTHESIA N/A 03/18/2020   Procedure: MRI WITH ANESTHESIA CERVICAL SPINE AND BRAIN  WITH AND WITHOUT CONTRAST;  Surgeon: Radiologist, Medication, MD;  Location: [facility] OR;  Service: Radiology;  Laterality: N/A;   TUBAL LIGATION Bilateral    VAGINAL HYSTERECTOMY  03/2006    There were no vitals filed for this visit.   Subjective Assessment - 02/17/21 1059     Subjective Patient reports she had an exacerbation after last session. Was unable to get into car initially but after resting 20 minutes could. Had a hard time at the hairdresser yesterday. Doctor has prescribed prednisone.    Pertinent History Patient is a pleasant 60 year old female who is returning to PT after MS exacerbation and hospitalization for COVID. Patient went to SNF for one month and then received home health PT for a month (8 visits).  PMH includes HTN, SVT, neurogenic bowel, MS (1995), neuropathy, hx of wrist fx, HPV, Hypercholesteremia, adiposity, osteopenia, spasticity. She wears bilateral AFOs and uses a RW and/or wheelchair for mobility. She drives an adapted car.    Limitations Lifting;Standing;Walking;House hold activities    How long can you sit comfortably? n/a    How long can you stand comfortably? 2 minutes  How long can you walk comfortably? 40 ft in house    Patient Stated Goals to return to PLOF prior to hospitalization    Currently in Pain? No/denies                 TherEx attempt at stand; excessive UE usage and LE trembling; terminated  Seated:  Weighted ball (2000gr) -chest press 10x cue for scapular retraction -cross body woodchop d1 patterning 10x each side -overhead raise 10x with straight arm raise -ballet twists 10x with cues for elbow flexion   Seated boxing: cues for sequencing, body mechanics, and pace/rhythm for functional contraction and timing of muscle recruitment: -Cross body punches to mitts on PT  hands with no back support for focused core stabilization with crossing midline 2x 60 seconds  -Cross body punch with secondary combination of elbow for full cross body rotation to PT mitt for trunk stability, coordination, and cardiovascular challenge x 60 seconds -Upper cut to mitts in PT hands for core activation without back support with perturbations 60 seconds  Large swiss ball forward trunk rollout 10x 3 second holds isometric contraction against PT resistance 70% contraction 3 second holds 10x each LE: hip flexion, abduction, adduction   scapular retraction and depression for L shoulder due to pain with ball rollouts x 3 minutes; decreased pain   Pt educated throughout session about proper posture and technique with exercises. Improved exercise technique, movement at target joints, use of target muscles after min to mod verbal, visual, tactile cues.  Patient unable to tolerate standing this session due to exacerbation of MS and fatigue. She is able to perform seated interventions well without increase in symptoms. Shoulder aggravation noted with forward trunk rollouts but relieved by scapular retraction and depression. The patient would benefit from continued skilled PT intervention to maximize QOL, safety, and function             PT Education - 02/17/21 1650     Education provided Yes    Education Details exercise technique, body mechanics    Person(s) Educated Patient    Methods Explanation;Demonstration;Tactile cues;Verbal cues    Comprehension Verbalized understanding;Returned demonstration;Verbal cues required;Tactile cues required              PT Short Term Goals - 02/08/21 1805       PT SHORT TERM GOAL #1   Title Patient will be independent in home exercise program to improve strength/mobility for better functional independence with ADLs.    Baseline 10/4 HEP provided    Time 4    Period Weeks    Status New    Target Date 03/08/21                PT Long Term Goals - 02/08/21 0001       PT LONG TERM GOAL #1   Title Patient will increase FOTO score to equal to or greater than  51/100   to demonstrate statistically significant improvement in mobility and quality of life.    Baseline 10/4: 42%    Time 12    Period Weeks    Status New    Target Date 05/03/21      PT LONG TERM GOAL #2   Title Patient (< 76 years old) will complete five times sit to stand test in < 20 seconds indicating an increased LE strength and improved balance.    Baseline 10/4: 33.15 seconds with heavy BUE support    Time 12    Period Weeks  Status New    Target Date 05/03/21      PT LONG TERM GOAL #3   Title Patient will increase 10 meter walk test to <20 seconds as to improve gait speed for better community ambulation and to reduce fall risk.    Baseline 10/4: 43.17 seconds with RW    Time 12    Period Weeks    Status New    Target Date 05/03/21      PT LONG TERM GOAL #4   Title Patient will reduce timed up and go to <11 seconds to reduce fall risk and demonstrate improved transfer/gait ability.    Baseline 10/4: 52.66 seconds with RW    Time 12    Period Weeks    Status New    Target Date 05/03/21                   Plan - 02/17/21 1701     Clinical Impression Statement Patient unable to tolerate standing this session due to exacerbation of MS and fatigue. She is able to perform seated interventions well without increase in symptoms. Shoulder aggravation noted with forward trunk rollouts but relieved by scapular retraction and depression. The patient would benefit from continued skilled PT intervention to maximize QOL, safety, and function    Personal Factors and Comorbidities Age;Comorbidity 3+;Finances;Fitness;Past/Current Experience;Sex;Social Background;Time since onset of injury/illness/exacerbation;Transportation    Comorbidities HTN, SVT, neurogenic bowel, MS (1995), neuropathy, hx of wrist fx, HPV, Hypercholesteremia, adiposity,  osteopenia, spasticity.    Examination-Activity Limitations Bathing;Bed Mobility;Bend;Carry;Continence;Dressing;Hygiene/Grooming;Stairs;Squat;Reach Overhead;Locomotion Level;Lift;Stand;Transfers;Toileting    Examination-Participation Restrictions Church;Cleaning;Community Activity;Laundry;Volunteer;Shop;Meal Prep;Yard Work    Conservation officer, historic buildings Evolving/Moderate complexity    Rehab Potential Good    PT Frequency 2x / week    PT Duration 12 weeks    PT Treatment/Interventions ADLs/Self Care Home Management;Aquatic Therapy;Biofeedback;Cryotherapy;Electrical Stimulation;Iontophoresis 4mg /ml Dexamethasone;Traction;Moist Heat;Ultrasound;DME Instruction;Gait training;Stair training;Functional mobility training;Therapeutic activities;Therapeutic exercise;Balance training;Neuromuscular re-education;Patient/family education;Orthotic Fit/Training;Wheelchair mobility training;Manual techniques;Compression bandaging;Passive range of motion;Dry needling;Energy conservation;Taping;Splinting;Vestibular    PT Next Visit Plan in // bars weight shift and LE strengthening    PT Home Exercise Plan HEP given    Consulted and Agree with Plan of Care Patient             Patient will benefit from skilled therapeutic intervention in order to improve the following deficits and impairments:  Abnormal gait, Decreased activity tolerance, Decreased balance, Decreased coordination, Decreased endurance, Decreased mobility, Decreased range of motion, Decreased strength, Difficulty walking, Impaired perceived functional ability, Impaired sensation, Impaired tone, Impaired UE functional use, Improper body mechanics, Postural dysfunction, Cardiopulmonary status limiting activity, Impaired flexibility, Increased muscle spasms  Visit Diagnosis: Muscle weakness (generalized)  Unsteadiness on feet  Other abnormalities of gait and mobility     Problem List Patient Active Problem List   Diagnosis Date Noted    Acute respiratory disease due to COVID-19 virus 11/21/2020   Weakness    Hypoalbuminemia due to protein-calorie malnutrition (HCC)    Neurogenic bowel    Neurogenic bladder    Labile blood pressure    Neuropathic pain    Abscess of female pelvis    SVT (supraventricular tachycardia) (HCC)    Radial styloid tenosynovitis 03/12/2018   Wheelchair confinement 02/27/2018   Localized osteoporosis with current pathological fracture with routine healing 01/19/2017   Wrist fracture 01/16/2017   Sprain of ankle 03/23/2016   Closed fracture of lateral malleolus 03/16/2016   Health care maintenance 01/24/2016   Blood pressure elevated without history of HTN 10/25/2015  Essential hypertension 10/25/2015   Multiple sclerosis (HCC) 10/02/2015   Chronic left shoulder pain 07/19/2015   Multiple sclerosis exacerbation (HCC) 07/14/2015   MS (multiple sclerosis) (HCC) 11/26/2014   Increased body mass index 11/26/2014   HPV test positive 11/26/2014   Status post laparoscopic supracervical hysterectomy 11/26/2014   Galactorrhea 11/26/2014   Back ache 05/21/2014   Adiposity 05/21/2014   Disordered sleep 05/21/2014   Muscle spasticity 05/21/2014   Spasticity 05/21/2014   Calculus of kidney 12/09/2013   Renal colic 12/09/2013   Hypercholesteremia 08/19/2013   Hereditary and idiopathic neuropathy 08/19/2013   Hypercholesterolemia without hypertriglyceridemia 08/19/2013   Bladder infection, chronic 07/25/2012   Disorder of bladder function 07/25/2012   Incomplete bladder emptying 07/25/2012   Microscopic hematuria 07/25/2012   Right upper quadrant pain 07/25/2012    Precious Bard, PT, DPT  02/17/2021, 5:02 PM  [place] [facility] MAIN [facility] SERVICES [address] [place], [place], [address] Phone: [phone]   Fax:  [phone]  Name: Danaria Larsen Belko MRN: [id number] Date of Birth: [birth year]

## 2021-02-17 NOTE — Telephone Encounter (Signed)
Received fax from [employer]. Re: need office notes from within last 6 months documenting need for tub transfer bench. Faxed last office note dated 01/31/21 to [employer]. Received confirmation.

## 2021-02-22 ENCOUNTER — Ambulatory Visit: Payer: PPO

## 2021-02-22 ENCOUNTER — Other Ambulatory Visit: Payer: Self-pay

## 2021-02-22 DIAGNOSIS — R2689 Other abnormalities of gait and mobility: Secondary | ICD-10-CM

## 2021-02-22 DIAGNOSIS — M6281 Muscle weakness (generalized): Secondary | ICD-10-CM | POA: Diagnosis not present

## 2021-02-22 DIAGNOSIS — R2681 Unsteadiness on feet: Secondary | ICD-10-CM

## 2021-02-22 NOTE — Therapy (Signed)
[place] [facility] MAIN [facility] SERVICES [address] [place], [place], [address] Phone: [phone]   Fax:  [phone]  Physical Therapy Treatment  Patient Details  Name: Laurella Tull Maietta MRN: [id number] Date of Birth: [birth year] Referring Provider (PT): Stephanie Acre MD   Encounter Date: 02/22/2021   PT End of Session - 02/22/21 1533     Visit Number 5    Number of Visits 24    Date for PT Re-Evaluation 05/03/21    Authorization Type 5/10 eval 02/08/21    PT Start Time 1015    PT Stop Time 1058    PT Time Calculation (min) 43 min    Equipment Utilized During Treatment Gait belt    Activity Tolerance Patient tolerated treatment well    Behavior During Therapy [facility] for tasks assessed/performed             Past Medical History:  Diagnosis Date   Abdominal pain, right upper quadrant    Back pain    Calculus of kidney 12/09/2013   Chronic back pain    unspecified   Chronic left shoulder pain 07/19/2015   Complication of anesthesia    Functional disorder of bladder    other   Galactorrhea 11/26/2014   Chronic    Hereditary and idiopathic neuropathy 08/19/2013   HPV test positive    Hypercholesteremia 08/19/2013   Hypertension    Incomplete bladder emptying    Microscopic hematuria    MS (multiple sclerosis) (HCC)    Muscle spasticity 05/21/2014   Nonspecific findings on examination of urine    other   Osteopenia    PONV (postoperative nausea and vomiting)    Status post laparoscopic supracervical hysterectomy 11/26/2014   Tobacco user 11/26/2014   Wrist fracture     Past Surgical History:  Procedure Laterality Date   bilateral tubal ligation  1996   BREAST CYST EXCISION Left 2002   FRACTURE SURGERY     KNEE SURGERY     right   LAPAROSCOPIC SUPRACERVICAL HYSTERECTOMY  08/05/2013   ORIF WRIST FRACTURE Left 01/17/2017   Procedure: OPEN REDUCTION INTERNAL FIXATION (ORIF) WRIST FRACTURE;  Surgeon: Lyndle Herrlich, MD;   Location: [facility] ORS;  Service: Orthopedics;  Laterality: Left;   RADIOLOGY WITH ANESTHESIA N/A 03/18/2020   Procedure: MRI WITH ANESTHESIA CERVICAL SPINE AND BRAIN  WITH AND WITHOUT CONTRAST;  Surgeon: Radiologist, Medication, MD;  Location: [facility] OR;  Service: Radiology;  Laterality: N/A;   TUBAL LIGATION Bilateral    VAGINAL HYSTERECTOMY  03/2006    There were no vitals filed for this visit.   Subjective Assessment - 02/22/21 1532     Subjective Patient reports today is her last dose of prednisone. Is feeling much better today from past week. No falls or LOB since last session.    Pertinent History Patient is a pleasant 60 year old female who is returning to PT after MS exacerbation and hospitalization for COVID. Patient went to SNF for one month and then received home health PT for a month (8 visits).  PMH includes HTN, SVT, neurogenic bowel, MS (1995), neuropathy, hx of wrist fx, HPV, Hypercholesteremia, adiposity, osteopenia, spasticity. She wears bilateral AFOs and uses a RW and/or wheelchair for mobility. She drives an adapted car.    Limitations Lifting;Standing;Walking;House hold activities    How long can you sit comfortably? n/a    How long can you stand comfortably? 2 minutes    How long can you walk comfortably? 40 ft in  house    Patient Stated Goals to return to PLOF prior to hospitalization    Currently in Pain? No/denies                 Standing:  ambulate 80 ft with one rest breaks with BRW and wheelchair follow. Patient required min VCs for correct technique including to increase core stabilization to facilitate better hip flexion to advance limb forward. Patient demonstrates inc reliance on BUE by end of gait trial. She started exhibiting increased circumduction with LLE towards end of distance with increased fatigue reported.     Seated: GTB row 15x RTB overhead raise 10x, cue for scapular retraction.  RTB ER 15x  RTB cross body d1 patterning 10x Modified  windmills 10x each side Hamstring dynadisc isometric 10x heel press, 10x toe press each LE  Isometric contraction against PT resistance for 3 second holds; 10x each direction -abduction, adduction, hip flexion    Ice pack donned throughout session to minimize exacerbation of MS symptoms in order to perform interventions.    Pt educated throughout session about proper posture and technique with exercises. Improved exercise technique, movement at target joints, use of target muscles after min to mod verbal, visual, tactile cues.      Patient tolerated shorter duration ambulation this session with decreased need for rest breaks. UE and LE strengthening tolerated well with rest breaks between each intervention. No aggravation of shoulder reported this session. Prolonged muscle recruitment will continue to be an area of focus due to patient fatigue. The patient would benefit from continued skilled PT intervention to maximize QOL, safety, and function                 PT Education - 02/22/21 1532     Education provided Yes    Education Details exercise technique, body mechanics    Person(s) Educated Patient    Methods Explanation;Demonstration;Tactile cues;Verbal cues    Comprehension Verbalized understanding;Returned demonstration;Verbal cues required;Tactile cues required              PT Short Term Goals - 02/08/21 1805       PT SHORT TERM GOAL #1   Title Patient will be independent in home exercise program to improve strength/mobility for better functional independence with ADLs.    Baseline 10/4 HEP provided    Time 4    Period Weeks    Status New    Target Date 03/08/21               PT Long Term Goals - 02/08/21 0001       PT LONG TERM GOAL #1   Title Patient will increase FOTO score to equal to or greater than  51/100   to demonstrate statistically significant improvement in mobility and quality of life.    Baseline 10/4: 42%    Time 12    Period  Weeks    Status New    Target Date 05/03/21      PT LONG TERM GOAL #2   Title Patient (< 71 years old) will complete five times sit to stand test in < 20 seconds indicating an increased LE strength and improved balance.    Baseline 10/4: 33.15 seconds with heavy BUE support    Time 12    Period Weeks    Status New    Target Date 05/03/21      PT LONG TERM GOAL #3   Title Patient will increase 10 meter walk test to <20 seconds  as to improve gait speed for better community ambulation and to reduce fall risk.    Baseline 10/4: 43.17 seconds with RW    Time 12    Period Weeks    Status New    Target Date 05/03/21      PT LONG TERM GOAL #4   Title Patient will reduce timed up and go to <11 seconds to reduce fall risk and demonstrate improved transfer/gait ability.    Baseline 10/4: 52.66 seconds with RW    Time 12    Period Weeks    Status New    Target Date 05/03/21                   Plan - 02/22/21 1552     Clinical Impression Statement Patient tolerated shorter duration ambulation this session with decreased need for rest breaks. UE and LE strengthening tolerated well with rest breaks between each intervention. No aggravation of shoulder reported this session. Prolonged muscle recruitment will continue to be an area of focus due to patient fatigue. The patient would benefit from continued skilled PT intervention to maximize QOL, safety, and function    Personal Factors and Comorbidities Age;Comorbidity 3+;Finances;Fitness;Past/Current Experience;Sex;Social Background;Time since onset of injury/illness/exacerbation;Transportation    Comorbidities HTN, SVT, neurogenic bowel, MS (1995), neuropathy, hx of wrist fx, HPV, Hypercholesteremia, adiposity, osteopenia, spasticity.    Examination-Activity Limitations Bathing;Bed Mobility;Bend;Carry;Continence;Dressing;Hygiene/Grooming;Stairs;Squat;Reach Overhead;Locomotion Level;Lift;Stand;Transfers;Toileting     Examination-Participation Restrictions Church;Cleaning;Community Activity;Laundry;Volunteer;Shop;Meal Prep;Yard Work    Conservation officer, historic buildings Evolving/Moderate complexity    Rehab Potential Good    PT Frequency 2x / week    PT Duration 12 weeks    PT Treatment/Interventions ADLs/Self Care Home Management;Aquatic Therapy;Biofeedback;Cryotherapy;Electrical Stimulation;Iontophoresis 4mg /ml Dexamethasone;Traction;Moist Heat;Ultrasound;DME Instruction;Gait training;Stair training;Functional mobility training;Therapeutic activities;Therapeutic exercise;Balance training;Neuromuscular re-education;Patient/family education;Orthotic Fit/Training;Wheelchair mobility training;Manual techniques;Compression bandaging;Passive range of motion;Dry needling;Energy conservation;Taping;Splinting;Vestibular    PT Next Visit Plan in // bars weight shift and LE strengthening    PT Home Exercise Plan HEP given    Consulted and Agree with Plan of Care Patient             Patient will benefit from skilled therapeutic intervention in order to improve the following deficits and impairments:  Abnormal gait, Decreased activity tolerance, Decreased balance, Decreased coordination, Decreased endurance, Decreased mobility, Decreased range of motion, Decreased strength, Difficulty walking, Impaired perceived functional ability, Impaired sensation, Impaired tone, Impaired UE functional use, Improper body mechanics, Postural dysfunction, Cardiopulmonary status limiting activity, Impaired flexibility, Increased muscle spasms  Visit Diagnosis: Muscle weakness (generalized)  Unsteadiness on feet  Other abnormalities of gait and mobility     Problem List Patient Active Problem List   Diagnosis Date Noted   Acute respiratory disease due to COVID-19 virus 11/21/2020   Weakness    Hypoalbuminemia due to protein-calorie malnutrition (HCC)    Neurogenic bowel    Neurogenic bladder    Labile blood pressure     Neuropathic pain    Abscess of female pelvis    SVT (supraventricular tachycardia) (HCC)    Radial styloid tenosynovitis 03/12/2018   Wheelchair confinement 02/27/2018   Localized osteoporosis with current pathological fracture with routine healing 01/19/2017   Wrist fracture 01/16/2017   Sprain of ankle 03/23/2016   Closed fracture of lateral malleolus 03/16/2016   Health care maintenance 01/24/2016   Blood pressure elevated without history of HTN 10/25/2015   Essential hypertension 10/25/2015   Multiple sclerosis (HCC) 10/02/2015   Chronic left shoulder pain 07/19/2015   Multiple sclerosis exacerbation (HCC) 07/14/2015  MS (multiple sclerosis) (HCC) 11/26/2014   Increased body mass index 11/26/2014   HPV test positive 11/26/2014   Status post laparoscopic supracervical hysterectomy 11/26/2014   Galactorrhea 11/26/2014   Back ache 05/21/2014   Adiposity 05/21/2014   Disordered sleep 05/21/2014   Muscle spasticity 05/21/2014   Spasticity 05/21/2014   Calculus of kidney 12/09/2013   Renal colic 12/09/2013   Hypercholesteremia 08/19/2013   Hereditary and idiopathic neuropathy 08/19/2013   Hypercholesterolemia without hypertriglyceridemia 08/19/2013   Bladder infection, chronic 07/25/2012   Disorder of bladder function 07/25/2012   Incomplete bladder emptying 07/25/2012   Microscopic hematuria 07/25/2012   Right upper quadrant pain 07/25/2012    Precious Bard, PT, DPT  02/22/2021, 3:54 PM  [place] [facility] MAIN [facility] SERVICES [address] [place], [place], [address] Phone: [phone]   Fax:  [phone]  Name: Yarrow Linhart Schimpf MRN: [id number] Date of Birth: Apr 07, 1961

## 2021-02-24 ENCOUNTER — Ambulatory Visit: Payer: PPO

## 2021-02-24 ENCOUNTER — Other Ambulatory Visit: Payer: Self-pay

## 2021-02-24 DIAGNOSIS — G35 Multiple sclerosis: Secondary | ICD-10-CM | POA: Diagnosis not present

## 2021-02-24 DIAGNOSIS — R2689 Other abnormalities of gait and mobility: Secondary | ICD-10-CM

## 2021-02-24 DIAGNOSIS — M6281 Muscle weakness (generalized): Secondary | ICD-10-CM | POA: Diagnosis not present

## 2021-02-24 DIAGNOSIS — R2681 Unsteadiness on feet: Secondary | ICD-10-CM

## 2021-02-24 DIAGNOSIS — R262 Difficulty in walking, not elsewhere classified: Secondary | ICD-10-CM

## 2021-02-24 NOTE — Therapy (Signed)
[place] [facility] MAIN [facility] SERVICES [address] [place], [place], [address] Phone: [phone]   Fax:  [phone]  Physical Therapy Treatment  Patient Details  Name: Lisa Williams MRN: [id number] Date of Birth: 11/08/1960 Referring Provider (PT): Stephanie Acre MD   Encounter Date: 02/24/2021   PT End of Session - 02/24/21 1237     Visit Number 6    Number of Visits 24    Date for PT Re-Evaluation 05/03/21    Authorization Type 5/10 eval 02/08/21    PT Start Time 1016    PT Stop Time 1058    PT Time Calculation (min) 42 min    Equipment Utilized During Treatment Gait belt    Activity Tolerance Patient tolerated treatment well    Behavior During Therapy [facility] for tasks assessed/performed             Past Medical History:  Diagnosis Date   Abdominal pain, right upper quadrant    Back pain    Calculus of kidney 12/09/2013   Chronic back pain    unspecified   Chronic left shoulder pain 07/19/2015   Complication of anesthesia    Functional disorder of bladder    other   Galactorrhea 11/26/2014   Chronic    Hereditary and idiopathic neuropathy 08/19/2013   HPV test positive    Hypercholesteremia 08/19/2013   Hypertension    Incomplete bladder emptying    Microscopic hematuria    MS (multiple sclerosis) (HCC)    Muscle spasticity 05/21/2014   Nonspecific findings on examination of urine    other   Osteopenia    PONV (postoperative nausea and vomiting)    Status post laparoscopic supracervical hysterectomy 11/26/2014   Tobacco user 11/26/2014   Wrist fracture     Past Surgical History:  Procedure Laterality Date   bilateral tubal ligation  1996   BREAST CYST EXCISION Left 2002   FRACTURE SURGERY     KNEE SURGERY     right   LAPAROSCOPIC SUPRACERVICAL HYSTERECTOMY  08/05/2013   ORIF WRIST FRACTURE Left 01/17/2017   Procedure: OPEN REDUCTION INTERNAL FIXATION (ORIF) WRIST FRACTURE;  Surgeon: Lyndle Herrlich, MD;   Location: [facility] ORS;  Service: Orthopedics;  Laterality: Left;   RADIOLOGY WITH ANESTHESIA N/A 03/18/2020   Procedure: MRI WITH ANESTHESIA CERVICAL SPINE AND BRAIN  WITH AND WITHOUT CONTRAST;  Surgeon: Radiologist, Medication, MD;  Location: [facility] OR;  Service: Radiology;  Laterality: N/A;   TUBAL LIGATION Bilateral    VAGINAL HYSTERECTOMY  03/2006    There were no vitals filed for this visit.   Subjective Assessment - 02/24/21 1050     Subjective Pt reports she is doing well and has a busy weekend aheda of her.  Pt notes she has a birthday party and baby shower to attend.    Pertinent History Patient is a pleasant 60 year old female who is returning to PT after MS exacerbation and hospitalization for COVID. Patient went to SNF for one month and then received home health PT for a month (8 visits).  PMH includes HTN, SVT, neurogenic bowel, MS (1995), neuropathy, hx of wrist fx, HPV, Hypercholesteremia, adiposity, osteopenia, spasticity. She wears bilateral AFOs and uses a RW and/or wheelchair for mobility. She drives an adapted car.    Limitations Lifting;Standing;Walking;House hold activities    How long can you sit comfortably? n/a    How long can you stand comfortably? 2 minutes    How long can you walk comfortably?  40 ft in house    Patient Stated Goals to return to PLOF prior to hospitalization    Currently in Pain? Yes                Standing:  Ambulation in // bars, length x4. Patient required min VCs for correct technique including to increase core stabilization to facilitate better hip flexion to advance limb forward. Patient demonstrates inc reliance on BUE by end of gait trial. She started exhibiting increased circumduction with LLE towards end of distance with increased fatigue reported.    Seated: GTB row 15x RTB overhead raise 10x, cue for scapular retraction RTB ER 15x  RTB cross body d1 patterning 10x Modified windmills 10x each side  Modified Russian twists with 2000  gram ball, x10 each side Balloon taps outside cone of support, 4x30 seconds, another set performed with airex pad on seat for increased proprioception.     Isometric contraction against PT resistance for 3 second holds; 10x each direction -abduction, adduction, hip flexion    Ice pack donned throughout session to minimize exacerbation of MS symptoms in order to perform interventions.    Pt did have difficulty in getting back into car, and required mod-maxA +2 therapists in order to assist patient into the car.  Pt somewhat discouraged, however reassured that she was doing great and requiring assistance was not an issue.           PT Education - 02/24/21 1237     Education provided Yes    Education Details exercise technique, body mechanics    Person(s) Educated Patient    Methods Explanation;Demonstration;Tactile cues;Verbal cues    Comprehension Verbalized understanding;Returned demonstration;Verbal cues required;Tactile cues required              PT Short Term Goals - 02/08/21 1805       PT SHORT TERM GOAL #1   Title Patient will be independent in home exercise program to improve strength/mobility for better functional independence with ADLs.    Baseline 10/4 HEP provided    Time 4    Period Weeks    Status New    Target Date 03/08/21               PT Long Term Goals - 02/08/21 0001       PT LONG TERM GOAL #1   Title Patient will increase FOTO score to equal to or greater than  51/100   to demonstrate statistically significant improvement in mobility and quality of life.    Baseline 10/4: 42%    Time 12    Period Weeks    Status New    Target Date 05/03/21      PT LONG TERM GOAL #2   Title Patient (< 80 years old) will complete five times sit to stand test in < 20 seconds indicating an increased LE strength and improved balance.    Baseline 10/4: 33.15 seconds with heavy BUE support    Time 12    Period Weeks    Status New    Target Date 05/03/21       PT LONG TERM GOAL #3   Title Patient will increase 10 meter walk test to <20 seconds as to improve gait speed for better community ambulation and to reduce fall risk.    Baseline 10/4: 43.17 seconds with RW    Time 12    Period Weeks    Status New    Target Date 05/03/21  PT LONG TERM GOAL #4   Title Patient will reduce timed up and go to <11 seconds to reduce fall risk and demonstrate improved transfer/gait ability.    Baseline 10/4: 52.66 seconds with RW    Time 12    Period Weeks    Status New    Target Date 05/03/21                   Plan - 02/24/21 1238     Clinical Impression Statement Pt tolerated treatment well during session with use of ice packs to cool down body in between bouts of exercises.  Pt tolerated whole body exercises listed in the exercises section well with no exacerbation of shoulder pain in the L shoulder.  Pt also engaged in core musculature training with [place] Twists, and seated balloon taps outside cone of support.  Pt will continue to benefit from skilled therapy to maximize QOL, safety and function.    Personal Factors and Comorbidities Age;Comorbidity 3+;Finances;Fitness;Past/Current Experience;Sex;Social Background;Time since onset of injury/illness/exacerbation;Transportation    Comorbidities HTN, SVT, neurogenic bowel, MS (1995), neuropathy, hx of wrist fx, HPV, Hypercholesteremia, adiposity, osteopenia, spasticity.    Examination-Activity Limitations Bathing;Bed Mobility;Bend;Carry;Continence;Dressing;Hygiene/Grooming;Stairs;Squat;Reach Overhead;Locomotion Level;Lift;Stand;Transfers;Toileting    Examination-Participation Restrictions Church;Cleaning;Community Activity;Laundry;Volunteer;Shop;Meal Prep;Yard Work    Conservation officer, historic buildings Evolving/Moderate complexity    Rehab Potential Good    PT Frequency 2x / week    PT Duration 12 weeks    PT Treatment/Interventions ADLs/Self Care Home Management;Aquatic  Therapy;Biofeedback;Cryotherapy;Electrical Stimulation;Iontophoresis 4mg /ml Dexamethasone;Traction;Moist Heat;Ultrasound;DME Instruction;Gait training;Stair training;Functional mobility training;Therapeutic activities;Therapeutic exercise;Balance training;Neuromuscular re-education;Patient/family education;Orthotic Fit/Training;Wheelchair mobility training;Manual techniques;Compression bandaging;Passive range of motion;Dry needling;Energy conservation;Taping;Splinting;Vestibular    PT Next Visit Plan in // bars weight shift and LE strengthening    PT Home Exercise Plan HEP given    Consulted and Agree with Plan of Care Patient             Patient will benefit from skilled therapeutic intervention in order to improve the following deficits and impairments:  Abnormal gait, Decreased activity tolerance, Decreased balance, Decreased coordination, Decreased endurance, Decreased mobility, Decreased range of motion, Decreased strength, Difficulty walking, Impaired perceived functional ability, Impaired sensation, Impaired tone, Impaired UE functional use, Improper body mechanics, Postural dysfunction, Cardiopulmonary status limiting activity, Impaired flexibility, Increased muscle spasms  Visit Diagnosis: Difficulty in walking, not elsewhere classified  Muscle weakness (generalized)  Unsteadiness on feet  Other abnormalities of gait and mobility     Problem List Patient Active Problem List   Diagnosis Date Noted   Acute respiratory disease due to COVID-19 virus 11/21/2020   Weakness    Hypoalbuminemia due to protein-calorie malnutrition (HCC)    Neurogenic bowel    Neurogenic bladder    Labile blood pressure    Neuropathic pain    Abscess of female pelvis    SVT (supraventricular tachycardia) (HCC)    Radial styloid tenosynovitis 03/12/2018   Wheelchair confinement 02/27/2018   Localized osteoporosis with current pathological fracture with routine healing 01/19/2017   Wrist fracture  01/16/2017   Sprain of ankle 03/23/2016   Closed fracture of lateral malleolus 03/16/2016   Health care maintenance 01/24/2016   Blood pressure elevated without history of HTN 10/25/2015   Essential hypertension 10/25/2015   Multiple sclerosis (HCC) 10/02/2015   Chronic left shoulder pain 07/19/2015   Multiple sclerosis exacerbation (HCC) 07/14/2015   MS (multiple sclerosis) (HCC) 11/26/2014   Increased body mass index 11/26/2014   HPV test positive 11/26/2014   Status post laparoscopic supracervical hysterectomy 11/26/2014  Galactorrhea 11/26/2014   Back ache 05/21/2014   Adiposity 05/21/2014   Disordered sleep 05/21/2014   Muscle spasticity 05/21/2014   Spasticity 05/21/2014   Calculus of kidney 12/09/2013   Renal colic 12/09/2013   Hypercholesteremia 08/19/2013   Hereditary and idiopathic neuropathy 08/19/2013   Hypercholesterolemia without hypertriglyceridemia 08/19/2013   Bladder infection, chronic 07/25/2012   Disorder of bladder function 07/25/2012   Incomplete bladder emptying 07/25/2012   Microscopic hematuria 07/25/2012   Right upper quadrant pain 07/25/2012    Nolon Bussing, PT, DPT 02/24/21, 1:03 PM   [place] [facility] MAIN [facility] SERVICES [address] [place], [place], [address] Phone: [phone]   Fax:  [phone]  Name: Lisa Williams MRN: [id number] Date of Birth: Sep 08, 1960

## 2021-03-01 ENCOUNTER — Ambulatory Visit: Payer: PPO

## 2021-03-01 ENCOUNTER — Other Ambulatory Visit: Payer: Self-pay

## 2021-03-01 ENCOUNTER — Telehealth: Payer: Self-pay | Admitting: Neurology

## 2021-03-01 DIAGNOSIS — M6281 Muscle weakness (generalized): Secondary | ICD-10-CM | POA: Diagnosis not present

## 2021-03-01 DIAGNOSIS — Z5181 Encounter for therapeutic drug level monitoring: Secondary | ICD-10-CM

## 2021-03-01 DIAGNOSIS — R3915 Urgency of urination: Secondary | ICD-10-CM

## 2021-03-01 DIAGNOSIS — R262 Difficulty in walking, not elsewhere classified: Secondary | ICD-10-CM

## 2021-03-01 DIAGNOSIS — R2681 Unsteadiness on feet: Secondary | ICD-10-CM

## 2021-03-01 MED ORDER — PREDNISONE 10 MG PO TABS: ORAL_TABLET | ORAL | 0 refills | Status: DC

## 2021-03-01 NOTE — Telephone Encounter (Signed)
Pt was just seen a month ago, any suggestions ?

## 2021-03-01 NOTE — Therapy (Signed)
[place] [facility] MAIN [facility] SERVICES [address] [place], [place], [address] Phone: [phone]   Fax:  [phone]  Physical Therapy Treatment  Patient Details  Name: Lisa Williams MRN: [id number] Date of Birth: [birth year] Referring Provider (PT): Stephanie Acre MD   Encounter Date: 03/01/2021   PT End of Session - 03/01/21 1006     Visit Number 7    Number of Visits 24    Date for PT Re-Evaluation 05/03/21    Authorization Type 5/10 eval 02/08/21    PT Start Time 1015    PT Stop Time 1059    PT Time Calculation (min) 44 min    Equipment Utilized During Treatment Gait belt    Activity Tolerance Patient tolerated treatment well    Behavior During Therapy [facility] for tasks assessed/performed             Past Medical History:  Diagnosis Date   Abdominal pain, right upper quadrant    Back pain    Calculus of kidney 12/09/2013   Chronic back pain    unspecified   Chronic left shoulder pain 07/19/2015   Complication of anesthesia    Functional disorder of bladder    other   Galactorrhea 11/26/2014   Chronic    Hereditary and idiopathic neuropathy 08/19/2013   HPV test positive    Hypercholesteremia 08/19/2013   Hypertension    Incomplete bladder emptying    Microscopic hematuria    MS (multiple sclerosis) (HCC)    Muscle spasticity 05/21/2014   Nonspecific findings on examination of urine    other   Osteopenia    PONV (postoperative nausea and vomiting)    Status post laparoscopic supracervical hysterectomy 11/26/2014   Tobacco user 11/26/2014   Wrist fracture     Past Surgical History:  Procedure Laterality Date   bilateral tubal ligation  1996   BREAST CYST EXCISION Left 2002   FRACTURE SURGERY     KNEE SURGERY     right   LAPAROSCOPIC SUPRACERVICAL HYSTERECTOMY  08/05/2013   ORIF WRIST FRACTURE Left 01/17/2017   Procedure: OPEN REDUCTION INTERNAL FIXATION (ORIF) WRIST FRACTURE;  Surgeon: Lyndle Herrlich, MD;   Location: [facility] ORS;  Service: Orthopedics;  Laterality: Left;   RADIOLOGY WITH ANESTHESIA N/A 03/18/2020   Procedure: MRI WITH ANESTHESIA CERVICAL SPINE AND BRAIN  WITH AND WITHOUT CONTRAST;  Surgeon: Radiologist, Medication, MD;  Location: [facility] OR;  Service: Radiology;  Laterality: N/A;   TUBAL LIGATION Bilateral    VAGINAL HYSTERECTOMY  03/2006    There were no vitals filed for this visit.   Subjective Assessment - 03/01/21 1230     Subjective Patient had a hard time getting up after last session. Went to the baby shower but was unable to go to the birthday party due to fatigue.    Pertinent History Patient is a pleasant 60 year old female who is returning to PT after MS exacerbation and hospitalization for COVID. Patient went to SNF for one month and then received home health PT for a month (8 visits).  PMH includes HTN, SVT, neurogenic bowel, MS (1995), neuropathy, hx of wrist fx, HPV, Hypercholesteremia, adiposity, osteopenia, spasticity. She wears bilateral AFOs and uses a RW and/or wheelchair for mobility. She drives an adapted car.    Limitations Lifting;Standing;Walking;House hold activities    How long can you sit comfortably? n/a    How long can you stand comfortably? 2 minutes    How long can you walk comfortably?  40 ft in house    Patient Stated Goals to return to PLOF prior to hospitalization    Currently in Pain? No/denies                 Standing in // bars: STS with mod A; standing weight shifts x60 seconds with focus on LE shift rather than upper body body shift STS with min a; static stand without UE support 2x 15-30 second holds STS with minA knee flexion 5x each LE, heavy BUE support     *seated rest breaks between all standing interventions.    Seated: RTB row 15x RTB ER 15x  RTB cross body d1 patterning 10x Hamstring dynadisc isometric 10x heel press, 10x toe press each LE   Isometric contraction against PT resistance for 3 second holds; 10x each  direction -abduction, adduction, hip flexion    Ice pack donned throughout session to minimize exacerbation of MS symptoms in order to perform interventions.    Pt educated throughout session about proper posture and technique with exercises. Improved exercise technique, movement at target joints, use of target muscles after min to mod verbal, visual, tactile cues.    Patient encouraged to follow up with neurologist due to recent bout of increased fatigue, weakness, and limited mobility. Patient agreeable to plan and will call physician after PT appointment. Is taking Zenaida Niece for now since she is unable to get into/out of car when fatigued. The patient would benefit from continued skilled PT intervention to maximize QOL, safety, and function                   PT Education - 03/01/21 1005     Education provided Yes    Education Details exercise technique, body mechanics    Person(s) Educated Patient    Methods Explanation;Demonstration;Tactile cues;Verbal cues    Comprehension Verbalized understanding;Returned demonstration;Verbal cues required;Tactile cues required              PT Short Term Goals - 02/08/21 1805       PT SHORT TERM GOAL #1   Title Patient will be independent in home exercise program to improve strength/mobility for better functional independence with ADLs.    Baseline 10/4 HEP provided    Time 4    Period Weeks    Status New    Target Date 03/08/21               PT Long Term Goals - 02/08/21 0001       PT LONG TERM GOAL #1   Title Patient will increase FOTO score to equal to or greater than  51/100   to demonstrate statistically significant improvement in mobility and quality of life.    Baseline 10/4: 42%    Time 12    Period Weeks    Status New    Target Date 05/03/21      PT LONG TERM GOAL #2   Title Patient (< 59 years old) will complete five times sit to stand test in < 20 seconds indicating an increased LE strength and  improved balance.    Baseline 10/4: 33.15 seconds with heavy BUE support    Time 12    Period Weeks    Status New    Target Date 05/03/21      PT LONG TERM GOAL #3   Title Patient will increase 10 meter walk test to <20 seconds as to improve gait speed for better community ambulation and to reduce fall risk.  Baseline 10/4: 43.17 seconds with RW    Time 12    Period Weeks    Status New    Target Date 05/03/21      PT LONG TERM GOAL #4   Title Patient will reduce timed up and go to <11 seconds to reduce fall risk and demonstrate improved transfer/gait ability.    Baseline 10/4: 52.66 seconds with RW    Time 12    Period Weeks    Status New    Target Date 05/03/21                   Plan - 03/01/21 1236     Clinical Impression Statement Patient encouraged to follow up with neurologist due to recent bout of increased fatigue, weakness, and limited mobility. Patient agreeable to plan and will call physician after PT appointment. Is taking Zenaida Niece for now since she is unable to get into/out of car when fatigued. The patient would benefit from continued skilled PT intervention to maximize QOL, safety, and function    Personal Factors and Comorbidities Age;Comorbidity 3+;Finances;Fitness;Past/Current Experience;Sex;Social Background;Time since onset of injury/illness/exacerbation;Transportation    Comorbidities HTN, SVT, neurogenic bowel, MS (1995), neuropathy, hx of wrist fx, HPV, Hypercholesteremia, adiposity, osteopenia, spasticity.    Examination-Activity Limitations Bathing;Bed Mobility;Bend;Carry;Continence;Dressing;Hygiene/Grooming;Stairs;Squat;Reach Overhead;Locomotion Level;Lift;Stand;Transfers;Toileting    Examination-Participation Restrictions Church;Cleaning;Community Activity;Laundry;Volunteer;Shop;Meal Prep;Yard Work    Conservation officer, historic buildings Evolving/Moderate complexity    Rehab Potential Good    PT Frequency 2x / week    PT Duration 12 weeks    PT  Treatment/Interventions ADLs/Self Care Home Management;Aquatic Therapy;Biofeedback;Cryotherapy;Electrical Stimulation;Iontophoresis 4mg /ml Dexamethasone;Traction;Moist Heat;Ultrasound;DME Instruction;Gait training;Stair training;Functional mobility training;Therapeutic activities;Therapeutic exercise;Balance training;Neuromuscular re-education;Patient/family education;Orthotic Fit/Training;Wheelchair mobility training;Manual techniques;Compression bandaging;Passive range of motion;Dry needling;Energy conservation;Taping;Splinting;Vestibular    PT Next Visit Plan in // bars weight shift and LE strengthening    PT Home Exercise Plan HEP given    Consulted and Agree with Plan of Care Patient             Patient will benefit from skilled therapeutic intervention in order to improve the following deficits and impairments:  Abnormal gait, Decreased activity tolerance, Decreased balance, Decreased coordination, Decreased endurance, Decreased mobility, Decreased range of motion, Decreased strength, Difficulty walking, Impaired perceived functional ability, Impaired sensation, Impaired tone, Impaired UE functional use, Improper body mechanics, Postural dysfunction, Cardiopulmonary status limiting activity, Impaired flexibility, Increased muscle spasms  Visit Diagnosis: Difficulty in walking, not elsewhere classified  Muscle weakness (generalized)  Unsteadiness on feet     Problem List Patient Active Problem List   Diagnosis Date Noted   Acute respiratory disease due to COVID-19 virus 11/21/2020   Weakness    Hypoalbuminemia due to protein-calorie malnutrition (HCC)    Neurogenic bowel    Neurogenic bladder    Labile blood pressure    Neuropathic pain    Abscess of female pelvis    SVT (supraventricular tachycardia) (HCC)    Radial styloid tenosynovitis 03/12/2018   Wheelchair confinement 02/27/2018   Localized osteoporosis with current pathological fracture with routine healing 01/19/2017    Wrist fracture 01/16/2017   Sprain of ankle 03/23/2016   Closed fracture of lateral malleolus 03/16/2016   Health care maintenance 01/24/2016   Blood pressure elevated without history of HTN 10/25/2015   Essential hypertension 10/25/2015   Multiple sclerosis (HCC) 10/02/2015   Chronic left shoulder pain 07/19/2015   Multiple sclerosis exacerbation (HCC) 07/14/2015   MS (multiple sclerosis) (HCC) 11/26/2014   Increased body mass index 11/26/2014   HPV test positive 11/26/2014  Status post laparoscopic supracervical hysterectomy 11/26/2014   Galactorrhea 11/26/2014   Back ache 05/21/2014   Adiposity 05/21/2014   Disordered sleep 05/21/2014   Muscle spasticity 05/21/2014   Spasticity 05/21/2014   Calculus of kidney 12/09/2013   Renal colic 12/09/2013   Hypercholesteremia 08/19/2013   Hereditary and idiopathic neuropathy 08/19/2013   Hypercholesterolemia without hypertriglyceridemia 08/19/2013   Bladder infection, chronic 07/25/2012   Disorder of bladder function 07/25/2012   Incomplete bladder emptying 07/25/2012   Microscopic hematuria 07/25/2012   Right upper quadrant pain 07/25/2012    Precious Bard, PT, DPT  03/01/2021, 12:40 PM  [place] [facility] MAIN [facility] SERVICES [address] [place], [place], [address] Phone: [phone]   Fax:  [phone]  Name: Keelan Pomerleau Lupe MRN: [id number] Date of Birth: [birth year]

## 2021-03-01 NOTE — Telephone Encounter (Signed)
I have faxed the orders to [facility] in [place].  Confirmation received, fax # [phone].

## 2021-03-01 NOTE — Telephone Encounter (Signed)
Pt called states her therapist informed her that she needs to be seen sooner than 6 months. Pt says she is not feeling like herself anymore and not sure if its coming from when she had COVID or if she needs to increase the dose of her MS medicine. Pt requesting a call back.

## 2021-03-01 NOTE — Telephone Encounter (Signed)
I called the patient.  The patient is still having some issues with feeling as if the legs are weak, generalized fatigue is noted.  This may be an MS exacerbation.  She did get some benefit from the transient course of prednisone, I will extend the prednisone another 12 days.  She will come in for blood work and urinalysis.

## 2021-03-03 ENCOUNTER — Other Ambulatory Visit: Payer: Self-pay | Admitting: Neurology

## 2021-03-03 ENCOUNTER — Ambulatory Visit: Payer: PPO

## 2021-03-03 DIAGNOSIS — R3915 Urgency of urination: Secondary | ICD-10-CM | POA: Diagnosis not present

## 2021-03-03 DIAGNOSIS — Z5181 Encounter for therapeutic drug level monitoring: Secondary | ICD-10-CM | POA: Diagnosis not present

## 2021-03-03 NOTE — Telephone Encounter (Signed)
[facility] Misty Stanley) called, fax did not  come through. Was refaxed and emailed. Misty Stanley confirm fax came through.

## 2021-03-04 ENCOUNTER — Encounter: Payer: Self-pay | Admitting: Hematology and Oncology

## 2021-03-04 LAB — COMPREHENSIVE METABOLIC PANEL
ALT: 78 IU/L — ABNORMAL HIGH (ref 0–32)
AST: 44 IU/L — ABNORMAL HIGH (ref 0–40)
Albumin/Globulin Ratio: 2.3 — ABNORMAL HIGH (ref 1.2–2.2)
Albumin: 4.5 g/dL (ref 3.8–4.9)
Alkaline Phosphatase: 158 IU/L — ABNORMAL HIGH (ref 44–121)
BUN/Creatinine Ratio: 30 — ABNORMAL HIGH (ref 9–23)
BUN: 21 mg/dL (ref 6–24)
Bilirubin Total: 0.7 mg/dL (ref 0.0–1.2)
CO2: 21 mmol/L (ref 20–29)
Calcium: 9.6 mg/dL (ref 8.7–10.2)
Chloride: 106 mmol/L (ref 96–106)
Creatinine, Ser: 0.69 mg/dL (ref 0.57–1.00)
Globulin, Total: 2 g/dL (ref 1.5–4.5)
Glucose: 87 mg/dL (ref 70–99)
Potassium: 4.2 mmol/L (ref 3.5–5.2)
Sodium: 143 mmol/L (ref 134–144)
Total Protein: 6.5 g/dL (ref 6.0–8.5)
eGFR: 100 mL/min/{1.73_m2} (ref >59)

## 2021-03-04 LAB — URINALYSIS, ROUTINE W REFLEX MICROSCOPIC
Bilirubin, UA: NEGATIVE
Glucose, UA: NEGATIVE
Ketones, UA: NEGATIVE
Nitrite, UA: NEGATIVE
RBC, UA: NEGATIVE
Specific Gravity, UA: 1.026 (ref 1.005–1.030)
Urobilinogen, Ur: 1 mg/dL (ref 0.2–1.0)
pH, UA: 5 (ref 5.0–7.5)

## 2021-03-04 LAB — CBC WITH DIFFERENTIAL/PLATELET
Basophils Absolute: 0 10*3/uL (ref 0.0–0.2)
Basos: 1 %
EOS (ABSOLUTE): 0 10*3/uL (ref 0.0–0.4)
Eos: 0 %
Hematocrit: 42 % (ref 34.0–46.6)
Hemoglobin: 13.9 g/dL (ref 11.1–15.9)
Immature Grans (Abs): 0 10*3/uL (ref 0.0–0.1)
Immature Granulocytes: 0 %
Lymphocytes Absolute: 0.5 10*3/uL — ABNORMAL LOW (ref 0.7–3.1)
Lymphs: 6 %
MCH: 26.4 pg — ABNORMAL LOW (ref 26.6–33.0)
MCHC: 33.1 g/dL (ref 31.5–35.7)
MCV: 80 fL (ref 79–97)
Monocytes Absolute: 0.2 10*3/uL (ref 0.1–0.9)
Monocytes: 3 %
Neutrophils Absolute: 7.5 10*3/uL — ABNORMAL HIGH (ref 1.4–7.0)
Neutrophils: 90 %
Platelets: 259 10*3/uL (ref 150–450)
RBC: 5.27 x10E6/uL (ref 3.77–5.28)
RDW: 14.2 % (ref 11.7–15.4)
WBC: 8.3 10*3/uL (ref 3.4–10.8)

## 2021-03-04 LAB — MICROSCOPIC EXAMINATION
Bacteria, UA: NONE SEEN
Casts: NONE SEEN /lpf
WBC, UA: NONE SEEN /hpf (ref 0–5)

## 2021-03-08 ENCOUNTER — Ambulatory Visit: Payer: PPO | Attending: Neurology

## 2021-03-08 ENCOUNTER — Other Ambulatory Visit: Payer: Self-pay

## 2021-03-08 DIAGNOSIS — M6281 Muscle weakness (generalized): Secondary | ICD-10-CM | POA: Diagnosis not present

## 2021-03-08 DIAGNOSIS — R2681 Unsteadiness on feet: Secondary | ICD-10-CM | POA: Diagnosis not present

## 2021-03-08 DIAGNOSIS — R2689 Other abnormalities of gait and mobility: Secondary | ICD-10-CM | POA: Insufficient documentation

## 2021-03-08 DIAGNOSIS — R262 Difficulty in walking, not elsewhere classified: Secondary | ICD-10-CM | POA: Insufficient documentation

## 2021-03-08 NOTE — Therapy (Signed)
[place] MAIN [facility] SERVICES [address] [place], [place], [address] Phone: [phone]   Fax:  [phone]  Physical Therapy Treatment  Patient Details  Name: Lisa Williams MRN: [id number] Date of Birth: January 23, 1961 Referring Provider (PT): Margette Fast MD   Encounter Date: 03/08/2021   PT End of Session - 03/08/21 1102     Visit Number 8    Number of Visits 24    Date for PT Re-Evaluation 05/03/21    Authorization Type 8/10 eval 02/08/21    PT Start Time 1031    PT Stop Time 1100    PT Time Calculation (min) 29 min    Equipment Utilized During Treatment Gait belt    Activity Tolerance Patient tolerated treatment well    Behavior During Therapy [facility] for tasks assessed/performed             Past Medical History:  Diagnosis Date   Abdominal pain, right upper quadrant    Back pain    Calculus of kidney 12/09/2013   Chronic back pain    unspecified   Chronic left shoulder pain 0000000   Complication of anesthesia    Functional disorder of bladder    other   Galactorrhea 11/26/2014   Chronic    Hereditary and idiopathic neuropathy 08/19/2013   HPV test positive    Hypercholesteremia 08/19/2013   Hypertension    Incomplete bladder emptying    Microscopic hematuria    MS (multiple sclerosis) (HCC)    Muscle spasticity 05/21/2014   Nonspecific findings on examination of urine    other   Osteopenia    PONV (postoperative nausea and vomiting)    Status post laparoscopic supracervical hysterectomy 11/26/2014   Tobacco user 11/26/2014   Wrist fracture     Past Surgical History:  Procedure Laterality Date   bilateral tubal ligation  1996   BREAST CYST EXCISION Left 2002   FRACTURE SURGERY     KNEE SURGERY     right   LAPAROSCOPIC SUPRACERVICAL HYSTERECTOMY  08/05/2013   ORIF WRIST FRACTURE Left 01/17/2017   Procedure: OPEN REDUCTION INTERNAL FIXATION (ORIF) WRIST FRACTURE;  Surgeon: Lovell Sheehan, MD;  Location:  [facility] ORS;  Service: Orthopedics;  Laterality: Left;   RADIOLOGY WITH ANESTHESIA N/A 03/18/2020   Procedure: MRI WITH ANESTHESIA CERVICAL SPINE AND BRAIN  WITH AND WITHOUT CONTRAST;  Surgeon: Radiologist, Medication, MD;  Location: [place];  Service: Radiology;  Laterality: N/A;   TUBAL LIGATION Bilateral    VAGINAL HYSTERECTOMY  03/2006    There were no vitals filed for this visit.   Subjective Assessment - 03/08/21 1101     Subjective Patient is late due to issues with transportation. No falls or LOB since last session. Had difficulty getting into her car on saturday.    Pertinent History Patient is a pleasant 60 year old female who is returning to PT after MS exacerbation and hospitalization for COVID. Patient went to SNF for one month and then received home health PT for a month (8 visits).  PMH includes HTN, SVT, neurogenic bowel, MS (1995), neuropathy, hx of wrist fx, HPV, Hypercholesteremia, adiposity, osteopenia, spasticity. She wears bilateral AFOs and uses a RW and/or wheelchair for mobility. She drives an adapted car.    Limitations Lifting;Standing;Walking;House hold activities    How long can you sit comfortably? n/a    How long can you stand comfortably? 2 minutes    How long can you walk comfortably? 40 ft in house  Patient Stated Goals to return to PLOF prior to hospitalization    Currently in Pain? No/denies               Standing:  ambulate 103 ft with one rest breaks with BRW and wheelchair follow. Patient required min VCs for correct technique including to increase core stabilization to facilitate better hip flexion to advance limb forward. Patient demonstrates inc reliance on BUE by end of gait trial. She started exhibiting increased circumduction with LLE towards end of distance with increased fatigue reported.   Seated: Sit to stand 3x from chair; very challenging with heavy BUE support GTB row 15x GTB bicep curl 15x each UE   Ice pack donned throughout  session to minimize exacerbation of MS symptoms in order to perform interventions.    Pt educated throughout session about proper posture and technique with exercises. Improved exercise technique, movement at target joints, use of target muscles after min to mod verbal, visual, tactile cues.  Patient session limited by late arrival due to issues with transportation. Despite limited time of session patient is highly motivated and has slight return of strength compared to previous sessions. Patient has neared the end of her taper from her neurologist due to recent MS flare up. The patient would benefit from continued skilled PT intervention to maximize QOL, safety, and function                PT Education - 03/08/21 1102     Education provided Yes    Education Details exercise technique, body mechanics    Person(s) Educated Patient    Methods Explanation;Demonstration;Tactile cues;Verbal cues    Comprehension Verbalized understanding;Returned demonstration;Verbal cues required;Tactile cues required              PT Short Term Goals - 02/08/21 1805       PT SHORT TERM GOAL #1   Title Patient will be independent in home exercise program to improve strength/mobility for better functional independence with ADLs.    Baseline 10/4 HEP provided    Time 4    Period Weeks    Status New    Target Date 03/08/21               PT Long Term Goals - 02/08/21 0001       PT LONG TERM GOAL #1   Title Patient will increase FOTO score to equal to or greater than  51/100   to demonstrate statistically significant improvement in mobility and quality of life.    Baseline 10/4: 42%    Time 12    Period Weeks    Status New    Target Date 05/03/21      PT LONG TERM GOAL #2   Title Patient (< 64 years old) will complete five times sit to stand test in < 20 seconds indicating an increased LE strength and improved balance.    Baseline 10/4: 33.15 seconds with heavy BUE support     Time 12    Period Weeks    Status New    Target Date 05/03/21      PT LONG TERM GOAL #3   Title Patient will increase 10 meter walk test to <20 seconds as to improve gait speed for better community ambulation and to reduce fall risk.    Baseline 10/4: 43.17 seconds with RW    Time 12    Period Weeks    Status New    Target Date 05/03/21  PT LONG TERM GOAL #4   Title Patient will reduce timed up and go to <11 seconds to reduce fall risk and demonstrate improved transfer/gait ability.    Baseline 10/4: 52.66 seconds with RW    Time 12    Period Weeks    Status New    Target Date 05/03/21                   Plan - 03/08/21 1106     Clinical Impression Statement Patient session limited by late arrival due to issues with transportation. Despite limited time of session patient is highly motivated and has slight return of strength compared to previous sessions. Patient has neared the end of her taper from her neurologist due to recent MS flare up. The patient would benefit from continued skilled PT intervention to maximize QOL, safety, and function    Personal Factors and Comorbidities Age;Comorbidity 3+;Finances;Fitness;Past/Current Experience;Sex;Social Background;Time since onset of injury/illness/exacerbation;Transportation    Comorbidities HTN, SVT, neurogenic bowel, MS (1995), neuropathy, hx of wrist fx, HPV, Hypercholesteremia, adiposity, osteopenia, spasticity.    Examination-Activity Limitations Bathing;Bed Mobility;Bend;Carry;Continence;Dressing;Hygiene/Grooming;Stairs;Squat;Reach Overhead;Locomotion Level;Lift;Stand;Transfers;Toileting    Examination-Participation Restrictions Church;Cleaning;Community Activity;Laundry;Volunteer;Shop;Meal Prep;Yard Work    Conservation officer, historic buildings Evolving/Moderate complexity    Rehab Potential Good    PT Frequency 2x / week    PT Duration 12 weeks    PT Treatment/Interventions ADLs/Self Care Home Management;Aquatic  Therapy;Biofeedback;Cryotherapy;Electrical Stimulation;Iontophoresis 4mg /ml Dexamethasone;Traction;Moist Heat;Ultrasound;DME Instruction;Gait training;Stair training;Functional mobility training;Therapeutic activities;Therapeutic exercise;Balance training;Neuromuscular re-education;Patient/family education;Orthotic Fit/Training;Wheelchair mobility training;Manual techniques;Compression bandaging;Passive range of motion;Dry needling;Energy conservation;Taping;Splinting;Vestibular    PT Next Visit Plan in // bars weight shift and LE strengthening    PT Home Exercise Plan HEP given    Consulted and Agree with Plan of Care Patient             Patient will benefit from skilled therapeutic intervention in order to improve the following deficits and impairments:  Abnormal gait, Decreased activity tolerance, Decreased balance, Decreased coordination, Decreased endurance, Decreased mobility, Decreased range of motion, Decreased strength, Difficulty walking, Impaired perceived functional ability, Impaired sensation, Impaired tone, Impaired UE functional use, Improper body mechanics, Postural dysfunction, Cardiopulmonary status limiting activity, Impaired flexibility, Increased muscle spasms  Visit Diagnosis: Difficulty in walking, not elsewhere classified  Muscle weakness (generalized)  Unsteadiness on feet     Problem List Patient Active Problem List   Diagnosis Date Noted   Acute respiratory disease due to COVID-19 virus 11/21/2020   Weakness    Hypoalbuminemia due to protein-calorie malnutrition (HCC)    Neurogenic bowel    Neurogenic bladder    Labile blood pressure    Neuropathic pain    Abscess of female pelvis    SVT (supraventricular tachycardia) (HCC)    Radial styloid tenosynovitis 03/12/2018   Wheelchair confinement 02/27/2018   Localized osteoporosis with current pathological fracture with routine healing 01/19/2017   Wrist fracture 01/16/2017   Sprain of ankle 03/23/2016    Closed fracture of lateral malleolus 03/16/2016   Health care maintenance 01/24/2016   Blood pressure elevated without history of HTN 10/25/2015   Essential hypertension 10/25/2015   Multiple sclerosis (HCC) 10/02/2015   Chronic left shoulder pain 07/19/2015   Multiple sclerosis exacerbation (HCC) 07/14/2015   MS (multiple sclerosis) (HCC) 11/26/2014   Increased body mass index 11/26/2014   HPV test positive 11/26/2014   Status post laparoscopic supracervical hysterectomy 11/26/2014   Galactorrhea 11/26/2014   Back ache 05/21/2014   Adiposity 05/21/2014   Disordered sleep 05/21/2014   Muscle spasticity  05/21/2014   Spasticity 05/21/2014   Calculus of kidney 99991111   Renal colic 99991111   Hypercholesteremia 08/19/2013   Hereditary and idiopathic neuropathy 08/19/2013   Hypercholesterolemia without hypertriglyceridemia 08/19/2013   Bladder infection, chronic 07/25/2012   Disorder of bladder function 07/25/2012   Incomplete bladder emptying 07/25/2012   Microscopic hematuria 07/25/2012   Right upper quadrant pain 07/25/2012    Janna Arch, PT, DPT  03/08/2021, 11:07 AM  [place] MAIN [facility] SERVICES [address] [place], [place], [address] Phone: [phone]   Fax:  [phone]  Name: Lisa Williams MRN: [id number] Date of Birth: 1960/08/23

## 2021-03-10 ENCOUNTER — Other Ambulatory Visit: Payer: Self-pay

## 2021-03-10 ENCOUNTER — Ambulatory Visit: Payer: PPO

## 2021-03-10 DIAGNOSIS — M6281 Muscle weakness (generalized): Secondary | ICD-10-CM

## 2021-03-10 DIAGNOSIS — R2681 Unsteadiness on feet: Secondary | ICD-10-CM

## 2021-03-10 DIAGNOSIS — R262 Difficulty in walking, not elsewhere classified: Secondary | ICD-10-CM

## 2021-03-10 NOTE — Therapy (Signed)
[place] [facility] MAIN [facility] SERVICES [address] [place], [place], [address] Phone: [phone]   Fax:  [phone]  Physical Therapy Treatment  Patient Details  Name: Lisa Williams MRN: [id number] Date of Birth: 02/20/1961 Referring Provider (PT): Stephanie Acre MD   Encounter Date: 03/10/2021   PT End of Session - 03/10/21 1012     Visit Number 9    Number of Visits 24    Date for PT Re-Evaluation 05/03/21    Authorization Type 9/10 eval 02/08/21    PT Start Time 1015    PT Stop Time 1059    PT Time Calculation (min) 44 min    Equipment Utilized During Treatment Gait belt    Activity Tolerance Patient tolerated treatment well    Behavior During Therapy [facility] for tasks assessed/performed             Past Medical History:  Diagnosis Date   Abdominal pain, right upper quadrant    Back pain    Calculus of kidney 12/09/2013   Chronic back pain    unspecified   Chronic left shoulder pain 07/19/2015   Complication of anesthesia    Functional disorder of bladder    other   Galactorrhea 11/26/2014   Chronic    Hereditary and idiopathic neuropathy 08/19/2013   HPV test positive    Hypercholesteremia 08/19/2013   Hypertension    Incomplete bladder emptying    Microscopic hematuria    MS (multiple sclerosis) (HCC)    Muscle spasticity 05/21/2014   Nonspecific findings on examination of urine    other   Osteopenia    PONV (postoperative nausea and vomiting)    Status post laparoscopic supracervical hysterectomy 11/26/2014   Tobacco user 11/26/2014   Wrist fracture     Past Surgical History:  Procedure Laterality Date   bilateral tubal ligation  1996   BREAST CYST EXCISION Left 2002   FRACTURE SURGERY     KNEE SURGERY     right   LAPAROSCOPIC SUPRACERVICAL HYSTERECTOMY  08/05/2013   ORIF WRIST FRACTURE Left 01/17/2017   Procedure: OPEN REDUCTION INTERNAL FIXATION (ORIF) WRIST FRACTURE;  Surgeon: Lyndle Herrlich, MD;  Location:  [facility] ORS;  Service: Orthopedics;  Laterality: Left;   RADIOLOGY WITH ANESTHESIA N/A 03/18/2020   Procedure: MRI WITH ANESTHESIA CERVICAL SPINE AND BRAIN  WITH AND WITHOUT CONTRAST;  Surgeon: Radiologist, Medication, MD;  Location: [facility];  Service: Radiology;  Laterality: N/A;   TUBAL LIGATION Bilateral    VAGINAL HYSTERECTOMY  03/2006    There were no vitals filed for this visit.   Subjective Assessment - 03/10/21 1257     Subjective Patient reports she is feeling good today. No falls or LOB since last session. May go to the holiday festival this weekend.    Pertinent History Patient is a pleasant 60 year old female who is returning to PT after MS exacerbation and hospitalization for COVID. Patient went to SNF for one month and then received home health PT for a month (8 visits).  PMH includes HTN, SVT, neurogenic bowel, MS (1995), neuropathy, hx of wrist fx, HPV, Hypercholesteremia, adiposity, osteopenia, spasticity. She wears bilateral AFOs and uses a RW and/or wheelchair for mobility. She drives an adapted car.    Limitations Lifting;Standing;Walking;House hold activities    How long can you sit comfortably? n/a    How long can you stand comfortably? 2 minutes    How long can you walk comfortably? 40 ft in house  Patient Stated Goals to return to PLOF prior to hospitalization    Currently in Pain? No/denies                   Standing:  ambulate 106 ft with one rest breaks with BRW and wheelchair follow. Patient required min VCs for correct technique including to increase core stabilization to facilitate better hip flexion to advance limb forward. Patient demonstrates inc reliance on BUE by end of gait trial. She started exhibiting increased circumduction with LLE towards end of distance with increased fatigue reported.     Seated: 2lb dumbbells:  -bicep curl 10x  -ER with supination grip 10x -abduction with elbows at 90 90 10x bilaterally  -chest press into paloff press  with BUE 10x -alternating punches 10x each UE -row 10x with cue for scapular retraction   Hamstring dynadisc isometric 10x heel press, 10x toe press each LE  Isometric contraction against PT resistance for 3 second holds; 10x each direction -abduction, adduction, hip flexion     Ice pack donned throughout session to minimize exacerbation of MS symptoms in order to perform interventions.    Pt educated throughout session about proper posture and technique with exercises. Improved exercise technique, movement at target joints, use of target muscles after min to mod verbal, visual, tactile cues.   Patient is ambulating with good weight shift this session. She is aware next session is a progress note and will bring her walker again. Weighted UE strengthening for posture and transfer training tolerated well. The patient would benefit from continued skilled PT intervention to maximize QOL, safety, and function                  PT Education - 03/10/21 1011     Education provided Yes    Education Details execise technique, body mechanics    Person(s) Educated Patient    Methods Explanation;Demonstration;Tactile cues;Verbal cues    Comprehension Verbalized understanding;Returned demonstration;Verbal cues required;Tactile cues required              PT Short Term Goals - 02/08/21 1805       PT SHORT TERM GOAL #1   Title Patient will be independent in home exercise program to improve strength/mobility for better functional independence with ADLs.    Baseline 10/4 HEP provided    Time 4    Period Weeks    Status New    Target Date 03/08/21               PT Long Term Goals - 02/08/21 0001       PT LONG TERM GOAL #1   Title Patient will increase FOTO score to equal to or greater than  51/100   to demonstrate statistically significant improvement in mobility and quality of life.    Baseline 10/4: 42%    Time 12    Period Weeks    Status New    Target Date  05/03/21      PT LONG TERM GOAL #2   Title Patient (< 69 years old) will complete five times sit to stand test in < 20 seconds indicating an increased LE strength and improved balance.    Baseline 10/4: 33.15 seconds with heavy BUE support    Time 12    Period Weeks    Status New    Target Date 05/03/21      PT LONG TERM GOAL #3   Title Patient will increase 10 meter walk test to <20 seconds  as to improve gait speed for better community ambulation and to reduce fall risk.    Baseline 10/4: 43.17 seconds with RW    Time 12    Period Weeks    Status New    Target Date 05/03/21      PT LONG TERM GOAL #4   Title Patient will reduce timed up and go to <11 seconds to reduce fall risk and demonstrate improved transfer/gait ability.    Baseline 10/4: 52.66 seconds with RW    Time 12    Period Weeks    Status New    Target Date 05/03/21                   Plan - 03/10/21 1305     Clinical Impression Statement Patient is ambulating with good weight shift this session. She is aware next session is a progress note and will bring her walker again. Weighted UE strengthening for posture and transfer training tolerated well. The patient would benefit from continued skilled PT intervention to maximize QOL, safety, and function    Personal Factors and Comorbidities Age;Comorbidity 3+;Finances;Fitness;Past/Current Experience;Sex;Social Background;Time since onset of injury/illness/exacerbation;Transportation    Comorbidities HTN, SVT, neurogenic bowel, MS (1995), neuropathy, hx of wrist fx, HPV, Hypercholesteremia, adiposity, osteopenia, spasticity.    Examination-Activity Limitations Bathing;Bed Mobility;Bend;Carry;Continence;Dressing;Hygiene/Grooming;Stairs;Squat;Reach Overhead;Locomotion Level;Lift;Stand;Transfers;Toileting    Examination-Participation Restrictions Church;Cleaning;Community Activity;Laundry;Volunteer;Shop;Meal Prep;Yard Work    Merchant navy officer  Evolving/Moderate complexity    Rehab Potential Good    PT Frequency 2x / week    PT Duration 12 weeks    PT Treatment/Interventions ADLs/Self Care Home Management;Aquatic Therapy;Biofeedback;Cryotherapy;Electrical Stimulation;Iontophoresis 4mg /ml Dexamethasone;Traction;Moist Heat;Ultrasound;DME Instruction;Gait training;Stair training;Functional mobility training;Therapeutic activities;Therapeutic exercise;Balance training;Neuromuscular re-education;Patient/family education;Orthotic Fit/Training;Wheelchair mobility training;Manual techniques;Compression bandaging;Passive range of motion;Dry needling;Energy conservation;Taping;Splinting;Vestibular    PT Next Visit Plan in // bars weight shift and LE strengthening    PT Home Exercise Plan HEP given    Consulted and Agree with Plan of Care Patient             Patient will benefit from skilled therapeutic intervention in order to improve the following deficits and impairments:  Abnormal gait, Decreased activity tolerance, Decreased balance, Decreased coordination, Decreased endurance, Decreased mobility, Decreased range of motion, Decreased strength, Difficulty walking, Impaired perceived functional ability, Impaired sensation, Impaired tone, Impaired UE functional use, Improper body mechanics, Postural dysfunction, Cardiopulmonary status limiting activity, Impaired flexibility, Increased muscle spasms  Visit Diagnosis: Difficulty in walking, not elsewhere classified  Muscle weakness (generalized)  Unsteadiness on feet     Problem List Patient Active Problem List   Diagnosis Date Noted   Acute respiratory disease due to COVID-19 virus 11/21/2020   Weakness    Hypoalbuminemia due to protein-calorie malnutrition (HCC)    Neurogenic bowel    Neurogenic bladder    Labile blood pressure    Neuropathic pain    Abscess of female pelvis    SVT (supraventricular tachycardia) (HCC)    Radial styloid tenosynovitis 03/12/2018   Wheelchair  confinement 02/27/2018   Localized osteoporosis with current pathological fracture with routine healing 01/19/2017   Wrist fracture 01/16/2017   Sprain of ankle 03/23/2016   Closed fracture of lateral malleolus 03/16/2016   Health care maintenance 01/24/2016   Blood pressure elevated without history of HTN 10/25/2015   Essential hypertension 10/25/2015   Multiple sclerosis ([place]) 10/02/2015   Chronic left shoulder pain 07/19/2015   Multiple sclerosis exacerbation ([place]) 07/14/2015   MS (multiple sclerosis) ([place]) 11/26/2014   Increased body mass index 11/26/2014  HPV test positive 11/26/2014   Status post laparoscopic supracervical hysterectomy 11/26/2014   Galactorrhea 11/26/2014   Back ache 05/21/2014   Adiposity 05/21/2014   Disordered sleep 05/21/2014   Muscle spasticity 05/21/2014   Spasticity 05/21/2014   Calculus of kidney 99991111   Renal colic 99991111   Hypercholesteremia 08/19/2013   Hereditary and idiopathic neuropathy 08/19/2013   Hypercholesterolemia without hypertriglyceridemia 08/19/2013   Bladder infection, chronic 07/25/2012   Disorder of bladder function 07/25/2012   Incomplete bladder emptying 07/25/2012   Microscopic hematuria 07/25/2012   Right upper quadrant pain 07/25/2012    Janna Arch, PT, DPT  03/10/2021, 1:06 PM  [place] MAIN [facility] SERVICES [address] [place], [place], [address] Phone: [phone]   Fax:  [phone]  Name: Adar Ferdon Shimko MRN: [id number] Date of Birth: 1960-11-10

## 2021-03-15 ENCOUNTER — Other Ambulatory Visit: Payer: Self-pay

## 2021-03-15 ENCOUNTER — Ambulatory Visit: Payer: PPO

## 2021-03-15 DIAGNOSIS — R262 Difficulty in walking, not elsewhere classified: Secondary | ICD-10-CM | POA: Diagnosis not present

## 2021-03-15 DIAGNOSIS — R2681 Unsteadiness on feet: Secondary | ICD-10-CM

## 2021-03-15 DIAGNOSIS — M6281 Muscle weakness (generalized): Secondary | ICD-10-CM

## 2021-03-15 NOTE — Therapy (Signed)
[place] MAIN [facility] SERVICES [address] [place], [place], [address] Phone: [phone]   Fax:  [phone]  Physical Therapy Treatment Physical Therapy Progress Note   Dates of reporting period  02/08/21   to   03/15/21   Patient Details  Name: Lisa Williams MRN: [id number] Date of Birth: May 04, 1961 Referring Provider (PT): Margette Fast MD   Encounter Date: 03/15/2021   PT End of Session - 03/15/21 1032     Visit Number 10    Number of Visits 24    Date for PT Re-Evaluation 05/03/21    Authorization Type 10/10 eval 02/08/21; next session 1/10 PN 11/8    PT Start Time 1015    PT Stop Time 1058    PT Time Calculation (min) 43 min    Equipment Utilized During Treatment Gait belt    Activity Tolerance Patient tolerated treatment well    Behavior During Therapy [facility] for tasks assessed/performed             Past Medical History:  Diagnosis Date   Abdominal pain, right upper quadrant    Back pain    Calculus of kidney 12/09/2013   Chronic back pain    unspecified   Chronic left shoulder pain 4/49/6759   Complication of anesthesia    Functional disorder of bladder    other   Galactorrhea 11/26/2014   Chronic    Hereditary and idiopathic neuropathy 08/19/2013   HPV test positive    Hypercholesteremia 08/19/2013   Hypertension    Incomplete bladder emptying    Microscopic hematuria    MS (multiple sclerosis) (HCC)    Muscle spasticity 05/21/2014   Nonspecific findings on examination of urine    other   Osteopenia    PONV (postoperative nausea and vomiting)    Status post laparoscopic supracervical hysterectomy 11/26/2014   Tobacco user 11/26/2014   Wrist fracture     Past Surgical History:  Procedure Laterality Date   bilateral tubal ligation  1996   BREAST CYST EXCISION Left 2002   FRACTURE SURGERY     KNEE SURGERY     right   LAPAROSCOPIC SUPRACERVICAL HYSTERECTOMY  08/05/2013   ORIF WRIST FRACTURE Left  01/17/2017   Procedure: OPEN REDUCTION INTERNAL FIXATION (ORIF) WRIST FRACTURE;  Surgeon: Lovell Sheehan, MD;  Location: [facility];  Service: Orthopedics;  Laterality: Left;   RADIOLOGY WITH ANESTHESIA N/A 03/18/2020   Procedure: MRI WITH ANESTHESIA CERVICAL SPINE AND BRAIN  WITH AND WITHOUT CONTRAST;  Surgeon: Radiologist, Medication, MD;  Location: [place];  Service: Radiology;  Laterality: N/A;   TUBAL LIGATION Bilateral    VAGINAL HYSTERECTOMY  03/2006    There were no vitals filed for this visit.   Subjective Assessment - 03/15/21 1033     Subjective Patient reports she is feeling good today. Brought her walker due to knowing she had a progress note today. No falls over the weekend.    Pertinent History Patient is a pleasant 60 year old female who is returning to PT after MS exacerbation and hospitalization for COVID. Patient went to SNF for one month and then received home health PT for a month (8 visits).  PMH includes HTN, SVT, neurogenic bowel, MS (1995), neuropathy, hx of wrist fx, HPV, Hypercholesteremia, adiposity, osteopenia, spasticity. She wears bilateral AFOs and uses a RW and/or wheelchair for mobility. She drives an adapted car.    Limitations Lifting;Standing;Walking;House hold activities    How long can you sit comfortably?  n/a    How long can you stand comfortably? 2 minutes    How long can you walk comfortably? 40 ft in house    Patient Stated Goals to return to PLOF prior to hospitalization    Currently in Pain? No/denies               Progress note HEP: compliant  FOTO: 36%  5x STS: 30 seconds SUE on RW; SUE on chair.  10 MWT: 40.8 seconds TUG: 49 seconds     Treatment: Seated boxing: cues for sequencing, body mechanics, and pace/rhythm for functional contraction and timing of muscle recruitment: -Cross body punches to mitts on PT hands with no back support for focused core stabilization with crossing midline 2x 60 seconds  -Cross body punch with  secondary combination of elbow for full cross body rotation to PT mitt for trunk stability, coordination, and cardiovascular challenge x 60 seconds -Upper cut to mitts in PT hands for core activation without back support with perturbations 60 seconds    Isometric contraction against PT resistance for 3 second holds; 10x each direction -abduction, adduction, hip flexion     Ice pack donned throughout session to minimize exacerbation of MS symptoms in order to perform interventions.    Pt educated throughout session about proper posture and technique with exercises. Improved exercise technique, movement at target joints, use of target muscles after min to mod verbal, visual, tactile cues.   Patient's condition has the potential to improve in response to therapy. Maximum improvement is yet to be obtained. The anticipated improvement is attainable and reasonable in a generally predictable time.  Patient reports she feels like she is improving despite her exacerbation.       Patient demonstrates good progress towards functional goals. Her ambulation and mobility is increasing in speed and velocity. She remains highly motivated for continuation of progression of mobility. Patient's condition has the potential to improve in response to therapy. Maximum improvement is yet to be obtained. The anticipated improvement is attainable and reasonable in a generally predictable time. The patient would benefit from continued skilled PT intervention to maximize QOL, safety, and function             PT Education - 03/15/21 1033     Education provided Yes    Education Details goals, POC, progress note    Person(s) Educated Patient    Methods Explanation;Demonstration;Tactile cues;Verbal cues    Comprehension Verbalized understanding;Returned demonstration;Verbal cues required;Tactile cues required              PT Short Term Goals - 03/15/21 1031       PT SHORT TERM GOAL #1   Title  Patient will be independent in home exercise program to improve strength/mobility for better functional independence with ADLs.    Baseline 10/4 HEP provided 11/8: HEP compliant    Time 4    Period Weeks    Status Achieved    Target Date 03/08/21               PT Long Term Goals - 03/15/21 0001       PT LONG TERM GOAL #1   Title Patient will increase FOTO score to equal to or greater than  51/100   to demonstrate statistically significant improvement in mobility and quality of life.    Baseline 10/4: 42% 11/8: 36%    Time 12    Period Weeks    Status On-going    Target Date 05/03/21  PT LONG TERM GOAL #2   Title Patient (< 57 years old) will complete five times sit to stand test in < 20 seconds indicating an increased LE strength and improved balance.    Baseline 10/4: 33.15 seconds with heavy BUE support 11/8: 30 seconds    Time 12    Period Weeks    Status Partially Met    Target Date 05/03/21      PT LONG TERM GOAL #3   Title Patient will increase 10 meter walk test to <20 seconds as to improve gait speed for better community ambulation and to reduce fall risk.    Baseline 10/4: 43.17 seconds with RW 11/8: 40.8 seconds    Time 12    Period Weeks    Status Partially Met    Target Date 05/03/21      PT LONG TERM GOAL #4   Title Patient will reduce timed up and go to <11 seconds to reduce fall risk and demonstrate improved transfer/gait ability.    Baseline 10/4: 52.66 seconds with RW 11/8: 49 seconds with RW    Time 12    Period Weeks    Status Partially Met    Target Date 05/03/21                   Plan - 03/15/21 1036     Clinical Impression Statement Patient demonstrates good progress towards functional goals. Her ambulation and mobility is increasing in speed and velocity. She remains highly motivated for continuation of progression of mobility. ?Patient's condition has the potential to improve in response to therapy. Maximum improvement is yet  to be obtained. The anticipated improvement is attainable and reasonable in a generally predictable time. The patient would benefit from continued skilled PT intervention to maximize QOL, safety, and function    Personal Factors and Comorbidities Age;Comorbidity 3+;Finances;Fitness;Past/Current Experience;Sex;Social Background;Time since onset of injury/illness/exacerbation;Transportation    Comorbidities HTN, SVT, neurogenic bowel, MS (1995), neuropathy, hx of wrist fx, HPV, Hypercholesteremia, adiposity, osteopenia, spasticity.    Examination-Activity Limitations Bathing;Bed Mobility;Bend;Carry;Continence;Dressing;Hygiene/Grooming;Stairs;Squat;Reach Overhead;Locomotion Level;Lift;Stand;Transfers;Toileting    Examination-Participation Restrictions Church;Cleaning;Community Activity;Laundry;Volunteer;Shop;Meal Prep;Yard Work    [employer]chant [employer] officer Evolving/Moderate complexity    Rehab Potential Good    PT Frequency 2x / week    PT Duration 12 weeks    PT Treatment/Interventions ADLs/Self Care Home Management;Aquatic Therapy;Biofeedback;Cryotherapy;Electrical Stimulation;Iontophoresis 54m/ml Dexamethasone;Traction;Moist Heat;Ultrasound;DME Instruction;Gait training;Stair training;Functional mobility training;Therapeutic activities;Therapeutic exercise;Balance training;Neuromuscular re-education;Patient/family education;Orthotic Fit/Training;Wheelchair mobility training;Manual techniques;Compression bandaging;Passive range of motion;Dry needling;Energy conservation;Taping;Splinting;Vestibular    PT Next Visit Plan in // bars weight shift and LE strengthening    PT Home Exercise Plan HEP given    Consulted and Agree with Plan of Care Patient             Patient will benefit from skilled therapeutic intervention in order to improve the following deficits and impairments:  Abnormal gait, Decreased activity tolerance, Decreased balance, Decreased coordination, Decreased endurance,  Decreased mobility, Decreased range of motion, Decreased strength, Difficulty walking, Impaired perceived functional ability, Impaired sensation, Impaired tone, Impaired UE functional use, Improper body mechanics, Postural dysfunction, Cardiopulmonary status limiting activity, Impaired flexibility, Increased muscle spasms  Visit Diagnosis: Difficulty in walking, not elsewhere classified  Muscle weakness (generalized)  Unsteadiness on feet     Problem List Patient Active Problem List   Diagnosis Date Noted   Acute respiratory disease due to COVID-19 virus 11/21/2020   Weakness    Hypoalbuminemia due to protein-calorie malnutrition (HCC)    Neurogenic bowel  Neurogenic bladder    Labile blood pressure    Neuropathic pain    Abscess of female pelvis    SVT (supraventricular tachycardia) (HCC)    Radial styloid tenosynovitis 03/12/2018   Wheelchair confinement 02/27/2018   Localized osteoporosis with current pathological fracture with routine healing 01/19/2017   Wrist fracture 01/16/2017   Sprain of ankle 03/23/2016   Closed fracture of lateral malleolus 03/16/2016   Health care maintenance 01/24/2016   Blood pressure elevated without history of HTN 10/25/2015   Essential hypertension 10/25/2015   Multiple sclerosis ([place]) 10/02/2015   Chronic left shoulder pain 07/19/2015   Multiple sclerosis exacerbation ([place]) 07/14/2015   MS (multiple sclerosis) ([place]) 11/26/2014   Increased body mass index 11/26/2014   HPV test positive 11/26/2014   Status post laparoscopic supracervical hysterectomy 11/26/2014   Galactorrhea 11/26/2014   Back ache 05/21/2014   Adiposity 05/21/2014   Disordered sleep 05/21/2014   Muscle spasticity 05/21/2014   Spasticity 05/21/2014   Calculus of kidney 25/89/4834   Renal colic 75/83/0746   Hypercholesteremia 08/19/2013   Hereditary and idiopathic neuropathy 08/19/2013   Hypercholesterolemia without hypertriglyceridemia 08/19/2013   Bladder  infection, chronic 07/25/2012   Disorder of bladder function 07/25/2012   Incomplete bladder emptying 07/25/2012   Microscopic hematuria 07/25/2012   Right upper quadrant pain 07/25/2012    Janna Arch, PT, DPT  03/15/2021, 11:22 AM  [place] MAIN [facility] SERVICES [address] [place], [place], [address] Phone: [phone]   Fax:  [phone]  Name: Lisa Williams MRN: [id number] Date of Birth: 21-Feb-1961

## 2021-03-17 ENCOUNTER — Other Ambulatory Visit: Payer: Self-pay

## 2021-03-17 ENCOUNTER — Ambulatory Visit: Payer: PPO

## 2021-03-17 DIAGNOSIS — R262 Difficulty in walking, not elsewhere classified: Secondary | ICD-10-CM

## 2021-03-17 DIAGNOSIS — R2681 Unsteadiness on feet: Secondary | ICD-10-CM

## 2021-03-17 DIAGNOSIS — M6281 Muscle weakness (generalized): Secondary | ICD-10-CM

## 2021-03-17 NOTE — Therapy (Signed)
[place] MAIN [facility] SERVICES [address] [place], [place], [address] Phone: [phone]   Fax:  [phone]  Physical Therapy Treatment  Patient Details  Name: Lisa Williams MRN: [id number] Date of Birth: 04-Sep-1960 Referring Provider (PT): Margette Fast MD   Encounter Date: 03/17/2021   PT End of Session - 03/17/21 1002     Visit Number 11    Number of Visits 24    Date for PT Re-Evaluation 05/03/21    Authorization Type 1/10 PN 11/8    PT Start Time 1015    PT Stop Time 1056    PT Time Calculation (min) 41 min    Equipment Utilized During Treatment Gait belt    Activity Tolerance Patient tolerated treatment well    Behavior During Therapy [facility] for tasks assessed/performed             Past Medical History:  Diagnosis Date   Abdominal pain, right upper quadrant    Back pain    Calculus of kidney 12/09/2013   Chronic back pain    unspecified   Chronic left shoulder pain 7/41/6384   Complication of anesthesia    Functional disorder of bladder    other   Galactorrhea 11/26/2014   Chronic    Hereditary and idiopathic neuropathy 08/19/2013   HPV test positive    Hypercholesteremia 08/19/2013   Hypertension    Incomplete bladder emptying    Microscopic hematuria    MS (multiple sclerosis) (HCC)    Muscle spasticity 05/21/2014   Nonspecific findings on examination of urine    other   Osteopenia    PONV (postoperative nausea and vomiting)    Status post laparoscopic supracervical hysterectomy 11/26/2014   Tobacco user 11/26/2014   Wrist fracture     Past Surgical History:  Procedure Laterality Date   bilateral tubal ligation  1996   BREAST CYST EXCISION Left 2002   FRACTURE SURGERY     KNEE SURGERY     right   LAPAROSCOPIC SUPRACERVICAL HYSTERECTOMY  08/05/2013   ORIF WRIST FRACTURE Left 01/17/2017   Procedure: OPEN REDUCTION INTERNAL FIXATION (ORIF) WRIST FRACTURE;  Surgeon: Lovell Sheehan, MD;  Location:  [facility];  Service: Orthopedics;  Laterality: Left;   RADIOLOGY WITH ANESTHESIA N/A 03/18/2020   Procedure: MRI WITH ANESTHESIA CERVICAL SPINE AND BRAIN  WITH AND WITHOUT CONTRAST;  Surgeon: Radiologist, Medication, MD;  Location: [place];  Service: Radiology;  Laterality: N/A;   TUBAL LIGATION Bilateral    VAGINAL HYSTERECTOMY  03/2006    There were no vitals filed for this visit.   Subjective Assessment - 03/17/21 1126     Subjective Patient reports feeling good today. No falls or LOB since last session. Has no plans for rest of session.    Pertinent History Patient is a pleasant 60 year old female who is returning to PT after MS exacerbation and hospitalization for COVID. Patient went to SNF for one month and then received home health PT for a month (8 visits).  PMH includes HTN, SVT, neurogenic bowel, MS (1995), neuropathy, hx of wrist fx, HPV, Hypercholesteremia, adiposity, osteopenia, spasticity. She wears bilateral AFOs and uses a RW and/or wheelchair for mobility. She drives an adapted car.    Limitations Lifting;Standing;Walking;House hold activities    How long can you sit comfortably? n/a    How long can you stand comfortably? 2 minutes    How long can you walk comfortably? 40 ft in house    Patient  Stated Goals to return to PLOF prior to hospitalization    Currently in Pain? No/denies                       Standing in // bars:  -Standing weight shifts 2x10 each side; UE support focus on hip shift rather than pull with arm All standing terminated after weight shift due to limited patient ability to retain standing position safely   Seated:  Matrix cable machine: Row with #22.5; 10x; 2 sets  Lat pull down with two handles #22.5 10x; 2 sets  Isometric contraction into PT hand: 10x 5 second holds with PT cueing patient for breathing and controlled contraction -adduction, abduction, flexion  Lateral weight shift over side of chair 10x each UE hand clap for  coordination sequencing and core activation 30 seconds Hamstring isometric pressing into dynadisc with heel 10x; toe 10x each LE    Ice pack donned throughout session to minimize exacerbation of MS symptoms in order to perform interventions.      Pt educated throughout session about proper posture and technique with exercises. Improved exercise technique, movement at target joints, use of target muscles after min to mod verbal, visual, tactile cues   Patient's standing tolerance is limited this session with all standing interventions terminated due to limited patient ability to retain standing position safely. Patient is motivated despite inability to stand this session. Strengthening interventions performed in supine with patient tolerating well. The patient would benefit from continued skilled PT intervention to maximize QOL, safety, and function                  PT Education - 03/17/21 1003     Education provided Yes    Education Details exercise technique, body mechanics    Person(s) Educated Patient    Methods Explanation;Demonstration;Tactile cues;Verbal cues    Comprehension Verbalized understanding;Returned demonstration;Verbal cues required;Tactile cues required              PT Short Term Goals - 03/15/21 1031       PT SHORT TERM GOAL #1   Title Patient will be independent in home exercise program to improve strength/mobility for better functional independence with ADLs.    Baseline 10/4 HEP provided 11/8: HEP compliant    Time 4    Period Weeks    Status Achieved    Target Date 03/08/21               PT Long Term Goals - 03/15/21 0001       PT LONG TERM GOAL #1   Title Patient will increase FOTO score to equal to or greater than  51/100   to demonstrate statistically significant improvement in mobility and quality of life.    Baseline 10/4: 42% 11/8: 36%    Time 12    Period Weeks    Status On-going    Target Date 05/03/21      PT LONG  TERM GOAL #2   Title Patient (< 61 years old) will complete five times sit to stand test in < 20 seconds indicating an increased LE strength and improved balance.    Baseline 10/4: 33.15 seconds with heavy BUE support 11/8: 30 seconds    Time 12    Period Weeks    Status Partially Met    Target Date 05/03/21      PT LONG TERM GOAL #3   Title Patient will increase 10 meter walk test to <20 seconds as  to improve gait speed for better community ambulation and to reduce fall risk.    Baseline 10/4: 43.17 seconds with RW 11/8: 40.8 seconds    Time 12    Period Weeks    Status Partially Met    Target Date 05/03/21      PT LONG TERM GOAL #4   Title Patient will reduce timed up and go to <11 seconds to reduce fall risk and demonstrate improved transfer/gait ability.    Baseline 10/4: 52.66 seconds with RW 11/8: 49 seconds with RW    Time 12    Period Weeks    Status Partially Met    Target Date 05/03/21                   Plan - 03/17/21 1131     Clinical Impression Statement Patient's standing tolerance is limited this session with all standing interventions terminated due to limited patient ability to retain standing position safely. Patient is motivated despite inability to stand this session. Strengthening interventions performed in supine with patient tolerating well. The patient would benefit from continued skilled PT intervention to maximize QOL, safety, and function    Personal Factors and Comorbidities Age;Comorbidity 3+;Finances;Fitness;Past/Current Experience;Sex;Social Background;Time since onset of injury/illness/exacerbation;Transportation    Comorbidities HTN, SVT, neurogenic bowel, MS (1995), neuropathy, hx of wrist fx, HPV, Hypercholesteremia, adiposity, osteopenia, spasticity.    Examination-Activity Limitations Bathing;Bed Mobility;Bend;Carry;Continence;Dressing;Hygiene/Grooming;Stairs;Squat;Reach Overhead;Locomotion Level;Lift;Stand;Transfers;Toileting     Examination-Participation Restrictions Church;Cleaning;Community Activity;Laundry;Volunteer;Shop;Meal Prep;Yard Work    Merchant [employer] officer Evolving/Moderate complexity    Rehab Potential Good    PT Frequency 2x / week    PT Duration 12 weeks    PT Treatment/Interventions ADLs/Self Care Home Management;Aquatic Therapy;Biofeedback;Cryotherapy;Electrical Stimulation;Iontophoresis 35m/ml Dexamethasone;Traction;Moist Heat;Ultrasound;DME Instruction;Gait training;Stair training;Functional mobility training;Therapeutic activities;Therapeutic exercise;Balance training;Neuromuscular re-education;Patient/family education;Orthotic Fit/Training;Wheelchair mobility training;Manual techniques;Compression bandaging;Passive range of motion;Dry needling;Energy conservation;Taping;Splinting;Vestibular    PT Next Visit Plan in // bars weight shift and LE strengthening    PT Home Exercise Plan HEP given    Consulted and Agree with Plan of Care Patient             Patient will benefit from skilled therapeutic intervention in order to improve the following deficits and impairments:  Abnormal gait, Decreased activity tolerance, Decreased balance, Decreased coordination, Decreased endurance, Decreased mobility, Decreased range of motion, Decreased strength, Difficulty walking, Impaired perceived functional ability, Impaired sensation, Impaired tone, Impaired UE functional use, Improper body mechanics, Postural dysfunction, Cardiopulmonary status limiting activity, Impaired flexibility, Increased muscle spasms  Visit Diagnosis: Difficulty in walking, not elsewhere classified  Muscle weakness (generalized)  Unsteadiness on feet     Problem List Patient Active Problem List   Diagnosis Date Noted   Acute respiratory disease due to COVID-19 virus 11/21/2020   Weakness    Hypoalbuminemia due to protein-calorie malnutrition (HCC)    Neurogenic bowel    Neurogenic bladder    Labile blood pressure     Neuropathic pain    Abscess of female pelvis    SVT (supraventricular tachycardia) (HCC)    Radial styloid tenosynovitis 03/12/2018   Wheelchair confinement 02/27/2018   Localized osteoporosis with current pathological fracture with routine healing 01/19/2017   Wrist fracture 01/16/2017   Sprain of ankle 03/23/2016   Closed fracture of lateral malleolus 03/16/2016   Health care maintenance 01/24/2016   Blood pressure elevated without history of HTN 10/25/2015   Essential hypertension 10/25/2015   Multiple sclerosis ([place] 10/02/2015   Chronic left shoulder pain 07/19/2015   [place] ([place]  07/14/2015   MS (multiple sclerosis) ([place]) 11/26/2014   Increased body mass index 11/26/2014   HPV test positive 11/26/2014   Status post laparoscopic supracervical hysterectomy 11/26/2014   Galactorrhea 11/26/2014   Back ache 05/21/2014   Adiposity 05/21/2014   Disordered sleep 05/21/2014   Muscle spasticity 05/21/2014   Spasticity 05/21/2014   Calculus of kidney 55/25/8948   Renal colic 34/75/8307   Hypercholesteremia 08/19/2013   Hereditary and idiopathic neuropathy 08/19/2013   Hypercholesterolemia without hypertriglyceridemia 08/19/2013   Bladder infection, chronic 07/25/2012   Disorder of bladder function 07/25/2012   Incomplete bladder emptying 07/25/2012   Microscopic hematuria 07/25/2012   Right upper quadrant pain 07/25/2012    Janna Arch, PT, DPT  03/17/2021, 11:32 AM  [place] MAIN [facility] SERVICES [address] [place], [place], [address] Phone: [phone]   Fax:  [phone]  Name: Lashante Fryberger Wittner MRN: [id number] Date of Birth: [birth year]

## 2021-03-21 ENCOUNTER — Telehealth: Payer: Self-pay

## 2021-03-21 NOTE — Telephone Encounter (Signed)
Attempted to submit PA received via fax- Key: [id number] Mayzent 2MG  tablets.  Response: Available without authorization.

## 2021-03-22 ENCOUNTER — Ambulatory Visit: Payer: PPO

## 2021-03-24 ENCOUNTER — Ambulatory Visit: Payer: PPO

## 2021-03-24 ENCOUNTER — Other Ambulatory Visit: Payer: Self-pay

## 2021-03-24 DIAGNOSIS — R2681 Unsteadiness on feet: Secondary | ICD-10-CM

## 2021-03-24 DIAGNOSIS — R262 Difficulty in walking, not elsewhere classified: Secondary | ICD-10-CM | POA: Diagnosis not present

## 2021-03-24 DIAGNOSIS — M6281 Muscle weakness (generalized): Secondary | ICD-10-CM

## 2021-03-24 NOTE — Therapy (Signed)
[place] MAIN [facility] SERVICES [address] [place], [place], [address] Phone: [phone]   Fax:  [phone]  Physical Therapy Treatment  Patient Details  Name: Lisa Williams MRN: [id number] Date of Birth: Sep 27, 1960 Referring Provider (PT): Margette Fast MD   Encounter Date: 03/24/2021   PT End of Session - 03/24/21 1131     Visit Number 12    Number of Visits 24    Date for PT Re-Evaluation 05/03/21    Authorization Type 2/10 PN 11/8    PT Start Time 1015    PT Stop Time 1101    PT Time Calculation (min) 46 min    Equipment Utilized During Treatment Gait belt    Activity Tolerance Patient tolerated treatment well    Behavior During Therapy [facility] for tasks assessed/performed             Past Medical History:  Diagnosis Date   Abdominal pain, right upper quadrant    Back pain    Calculus of kidney 12/09/2013   Chronic back pain    unspecified   Chronic left shoulder pain 8/93/8101   Complication of anesthesia    Functional disorder of bladder    other   Galactorrhea 11/26/2014   Chronic    Hereditary and idiopathic neuropathy 08/19/2013   HPV test positive    Hypercholesteremia 08/19/2013   Hypertension    Incomplete bladder emptying    Microscopic hematuria    MS (multiple sclerosis) (HCC)    Muscle spasticity 05/21/2014   Nonspecific findings on examination of urine    other   Osteopenia    PONV (postoperative nausea and vomiting)    Status post laparoscopic supracervical hysterectomy 11/26/2014   Tobacco user 11/26/2014   Wrist fracture     Past Surgical History:  Procedure Laterality Date   bilateral tubal ligation  1996   BREAST CYST EXCISION Left 2002   FRACTURE SURGERY     KNEE SURGERY     right   LAPAROSCOPIC SUPRACERVICAL HYSTERECTOMY  08/05/2013   ORIF WRIST FRACTURE Left 01/17/2017   Procedure: OPEN REDUCTION INTERNAL FIXATION (ORIF) WRIST FRACTURE;  Surgeon: Lovell Sheehan, MD;  Location:  [facility];  Service: Orthopedics;  Laterality: Left;   RADIOLOGY WITH ANESTHESIA N/A 03/18/2020   Procedure: MRI WITH ANESTHESIA CERVICAL SPINE AND BRAIN  WITH AND WITHOUT CONTRAST;  Surgeon: Radiologist, Medication, MD;  Location: [place];  Service: Radiology;  Laterality: N/A;   TUBAL LIGATION Bilateral    VAGINAL HYSTERECTOMY  03/2006    There were no vitals filed for this visit.   Subjective Assessment - 03/24/21 1130     Subjective Patient missed last session due to weather.  Was busy making pies with her mother reports fatigue after making pause but is pleased with her progress.    Pertinent History Patient is a pleasant 60 year old female who is returning to PT after MS exacerbation and hospitalization for COVID. Patient went to SNF for one month and then received home health PT for a month (8 visits).  PMH includes HTN, SVT, neurogenic bowel, MS (1995), neuropathy, hx of wrist fx, HPV, Hypercholesteremia, adiposity, osteopenia, spasticity. She wears bilateral AFOs and uses a RW and/or wheelchair for mobility. She drives an adapted car.    Limitations Lifting;Standing;Walking;House hold activities    How long can you sit comfortably? n/a    How long can you stand comfortably? 2 minutes    How long can you walk comfortably? [place]  ft in house    Patient Stated Goals to return to PLOF prior to hospitalization    Currently in Pain? No/denies                   Standing in // bars:  -Standing weight shifts 2x10 each side; UE support focus on hip shift rather than pull with arm -Hip flexion into green Thera-Band across parallel bars 10 times each lower extremity. -Modified tandem stance 15-second hold each lower extremity placement. -Last stand terminated due to excessive fatigue.   Seated:     Single limb Red Thera-Band hip abduction 15 times each lower extremity Single limb Red Thera-Band hip AB duction 15 times each lower extremity hand clap for coordination sequencing and core  activation 30 seconds  5 pound bar: -Chest press 10 times -Overhead press 10 times -Alternate anterior deltoid raises 10 times each side -Reverse bicep curl 12 times -Bicep curl to overhead raise 10 times    Ice pack donned throughout session to minimize exacerbation of MS symptoms in order to perform interventions.      Pt educated throughout session about proper posture and technique with exercises. Improved exercise technique, movement at target joints, use of target muscles after min to mod verbal, visual, tactile cues       Patient is able to tolerate increase standing the session however did have fatigue noted after approximately 18 minutes of intermittent standing.  Rest of session tolerated in seated position.  Patient remains highly motivated throughout physical therapy session.  Her core stability is improving with decreased trunk movement noted with upper extremity strengthening techniques. The patient would benefit from continued skilled PT intervention to maximize QOL, safety, and function                  PT Education - 03/24/21 1131     Education provided Yes    Education Details Standing and exercise technique    Person(s) Educated Patient    Methods Explanation;Demonstration;Tactile cues;Verbal cues    Comprehension Verbalized understanding;Returned demonstration;Verbal cues required;Tactile cues required              PT Short Term Goals - 03/15/21 1031       PT SHORT TERM GOAL #1   Title Patient will be independent in home exercise program to improve strength/mobility for better functional independence with ADLs.    Baseline 10/4 HEP provided 11/8: HEP compliant    Time 4    Period Weeks    Status Achieved    Target Date 03/08/21               PT Long Term Goals - 03/15/21 0001       PT LONG TERM GOAL #1   Title Patient will increase FOTO score to equal to or greater than  51/100   to demonstrate statistically significant  improvement in mobility and quality of life.    Baseline 10/4: 42% 11/8: 36%    Time 12    Period Weeks    Status On-going    Target Date 05/03/21      PT LONG TERM GOAL #2   Title Patient (< 60 years old) will complete five times sit to stand test in < 20 seconds indicating an increased LE strength and improved balance.    Baseline 10/4: 33.15 seconds with heavy BUE support 11/8: 30 seconds    Time 12    Period Weeks    Status Partially Met  Target Date 05/03/21      PT LONG TERM GOAL #3   Title Patient will increase 10 meter walk test to <20 seconds as to improve gait speed for better community ambulation and to reduce fall risk.    Baseline 10/4: 43.17 seconds with RW 11/8: 40.8 seconds    Time 12    Period Weeks    Status Partially Met    Target Date 05/03/21      PT LONG TERM GOAL #4   Title Patient will reduce timed up and go to <11 seconds to reduce fall risk and demonstrate improved transfer/gait ability.    Baseline 10/4: 52.66 seconds with RW 11/8: 49 seconds with RW    Time 12    Period Weeks    Status Partially Met    Target Date 05/03/21                   Plan - 03/24/21 1135     Clinical Impression Statement Patient is able to tolerate increase standing the session however did have fatigue noted after approximately 18 minutes of intermittent standing.  Rest of session tolerated in seated position.  Patient remains highly motivated throughout physical therapy session.  Her core stability is improving with decreased trunk movement noted with upper extremity strengthening techniques. The patient would benefit from continued skilled PT intervention to maximize QOL, safety, and function    Personal Factors and Comorbidities Age;Comorbidity 3+;Finances;Fitness;Past/Current Experience;Sex;Social Background;Time since onset of injury/illness/exacerbation;Transportation    Comorbidities HTN, SVT, neurogenic bowel, MS (1995), neuropathy, hx of wrist fx, HPV,  Hypercholesteremia, adiposity, osteopenia, spasticity.    Examination-Activity Limitations Bathing;Bed Mobility;Bend;Carry;Continence;Dressing;Hygiene/Grooming;Stairs;Squat;Reach Overhead;Locomotion Level;Lift;Stand;Transfers;Toileting    Examination-Participation Restrictions Church;Cleaning;Community Activity;Laundry;Volunteer;Shop;Meal Prep;Yard Work    [employer]chant [employer] officer Evolving/Moderate complexity    Rehab Potential Good    PT Frequency 2x / week    PT Duration 12 weeks    PT Treatment/Interventions ADLs/Self Care Home Management;Aquatic Therapy;Biofeedback;Cryotherapy;Electrical Stimulation;Iontophoresis 80m/ml Dexamethasone;Traction;Moist Heat;Ultrasound;DME Instruction;Gait training;Stair training;Functional mobility training;Therapeutic activities;Therapeutic exercise;Balance training;Neuromuscular re-education;Patient/family education;Orthotic Fit/Training;Wheelchair mobility training;Manual techniques;Compression bandaging;Passive range of motion;Dry needling;Energy conservation;Taping;Splinting;Vestibular    PT Next Visit Plan in // bars weight shift and LE strengthening    PT Home Exercise Plan HEP given    Consulted and Agree with Plan of Care Patient             Patient will benefit from skilled therapeutic intervention in order to improve the following deficits and impairments:  Abnormal gait, Decreased activity tolerance, Decreased balance, Decreased coordination, Decreased endurance, Decreased mobility, Decreased range of motion, Decreased strength, Difficulty walking, Impaired perceived functional ability, Impaired sensation, Impaired tone, Impaired UE functional use, Improper body mechanics, Postural dysfunction, Cardiopulmonary status limiting activity, Impaired flexibility, Increased muscle spasms  Visit Diagnosis: Difficulty in walking, not elsewhere classified  Muscle weakness (generalized)  Unsteadiness on feet     Problem List Patient Active  Problem List   Diagnosis Date Noted   Acute respiratory disease due to COVID-19 virus 11/21/2020   Weakness    Hypoalbuminemia due to protein-calorie malnutrition (HCC)    Neurogenic bowel    Neurogenic bladder    Labile blood pressure    Neuropathic pain    Abscess of female pelvis    SVT (supraventricular tachycardia) (HCC)    Radial styloid tenosynovitis 03/12/2018   Wheelchair confinement 02/27/2018   Localized osteoporosis with current pathological fracture with routine healing 01/19/2017   Wrist fracture 01/16/2017   Sprain of ankle 03/23/2016   Closed fracture of lateral malleolus  03/16/2016   Health care maintenance 01/24/2016   Blood pressure elevated without history of HTN 10/25/2015   Essential hypertension 10/25/2015   Multiple sclerosis ([place]) 10/02/2015   Chronic left shoulder pain 07/19/2015   Multiple sclerosis exacerbation ([place]) 07/14/2015   MS (multiple sclerosis) ([place]) 11/26/2014   Increased body mass index 11/26/2014   HPV test positive 11/26/2014   Status post laparoscopic supracervical hysterectomy 11/26/2014   Galactorrhea 11/26/2014   Back ache 05/21/2014   Adiposity 05/21/2014   Disordered sleep 05/21/2014   Muscle spasticity 05/21/2014   Spasticity 05/21/2014   Calculus of kidney 14/02/3012   Renal colic 14/38/8875   Hypercholesteremia 08/19/2013   Hereditary and idiopathic neuropathy 08/19/2013   Hypercholesterolemia without hypertriglyceridemia 08/19/2013   Bladder infection, chronic 07/25/2012   Disorder of bladder function 07/25/2012   Incomplete bladder emptying 07/25/2012   Microscopic hematuria 07/25/2012   Right upper quadrant pain 07/25/2012   Janna Arch, PT, DPT  03/24/2021, 11:36 AM  [place] MAIN [facility] SERVICES [address] [place], [place], [address] Phone: [phone]   Fax:  [phone]  Name: Lisa Williams MRN: [id number] Date of Birth: 1960/12/29

## 2021-03-29 ENCOUNTER — Ambulatory Visit: Payer: PPO

## 2021-03-29 ENCOUNTER — Other Ambulatory Visit: Payer: Self-pay

## 2021-03-29 DIAGNOSIS — R2681 Unsteadiness on feet: Secondary | ICD-10-CM

## 2021-03-29 DIAGNOSIS — M6281 Muscle weakness (generalized): Secondary | ICD-10-CM

## 2021-03-29 DIAGNOSIS — R262 Difficulty in walking, not elsewhere classified: Secondary | ICD-10-CM | POA: Diagnosis not present

## 2021-03-29 DIAGNOSIS — R2689 Other abnormalities of gait and mobility: Secondary | ICD-10-CM

## 2021-03-29 NOTE — Therapy (Signed)
[place] MAIN [facility] SERVICES [address] [place], [place], [address] Phone: [phone]   Fax:  [phone]  Physical Therapy Treatment  Patient Details  Name: Lisa Williams MRN: [id number] Date of Birth: 1960/12/06 Referring Provider (PT): Margette Fast MD   Encounter Date: 03/29/2021   PT End of Session - 03/29/21 0946     Visit Number 13    Number of Visits 24    Date for PT Re-Evaluation 05/03/21    Authorization Type 3/10 PN 11/8    PT Start Time 0950    PT Stop Time 1035    PT Time Calculation (min) 45 min    Equipment Utilized During Treatment Gait belt    Activity Tolerance Patient tolerated treatment well    Behavior During Therapy [facility] for tasks assessed/performed             Past Medical History:  Diagnosis Date   Abdominal pain, right upper quadrant    Back pain    Calculus of kidney 12/09/2013   Chronic back pain    unspecified   Chronic left shoulder pain 3/76/2831   Complication of anesthesia    Functional disorder of bladder    other   Galactorrhea 11/26/2014   Chronic    Hereditary and idiopathic neuropathy 08/19/2013   HPV test positive    Hypercholesteremia 08/19/2013   Hypertension    Incomplete bladder emptying    Microscopic hematuria    MS (multiple sclerosis) (HCC)    Muscle spasticity 05/21/2014   Nonspecific findings on examination of urine    other   Osteopenia    PONV (postoperative nausea and vomiting)    Status post laparoscopic supracervical hysterectomy 11/26/2014   Tobacco user 11/26/2014   Wrist fracture     Past Surgical History:  Procedure Laterality Date   bilateral tubal ligation  1996   BREAST CYST EXCISION Left 2002   FRACTURE SURGERY     KNEE SURGERY     right   LAPAROSCOPIC SUPRACERVICAL HYSTERECTOMY  08/05/2013   ORIF WRIST FRACTURE Left 01/17/2017   Procedure: OPEN REDUCTION INTERNAL FIXATION (ORIF) WRIST FRACTURE;  Surgeon: Lovell Sheehan, MD;  Location:  [facility];  Service: Orthopedics;  Laterality: Left;   RADIOLOGY WITH ANESTHESIA N/A 03/18/2020   Procedure: MRI WITH ANESTHESIA CERVICAL SPINE AND BRAIN  WITH AND WITHOUT CONTRAST;  Surgeon: Radiologist, Medication, MD;  Location: [place];  Service: Radiology;  Laterality: N/A;   TUBAL LIGATION Bilateral    VAGINAL HYSTERECTOMY  03/2006    There were no vitals filed for this visit.   Subjective Assessment - 03/29/21 1115     Subjective Patient reports with L shoulder pain and numbness to fingers and R flank spasms.    Pertinent History Patient is a pleasant 60 year old female who is returning to PT after MS exacerbation and hospitalization for COVID. Patient went to SNF for one month and then received home health PT for a month (8 visits).  PMH includes HTN, SVT, neurogenic bowel, MS (1995), neuropathy, hx of wrist fx, HPV, Hypercholesteremia, adiposity, osteopenia, spasticity. She wears bilateral AFOs and uses a RW and/or wheelchair for mobility. She drives an adapted car.    Limitations Lifting;Standing;Walking;House hold activities    How long can you sit comfortably? n/a    How long can you stand comfortably? 2 minutes    How long can you walk comfortably? 40 ft in house    Patient Stated Goals to return to  PLOF prior to hospitalization    Currently in Pain? Yes    Pain Score 6     Pain Location Shoulder    Pain Orientation Left    Pain Descriptors / Indicators Aching;Throbbing;Radiating    Pain Type Acute pain    Pain Onset In the past 7 days    Pain Frequency Constant                   Manual: STM with implementation of effleurage and pettrisage to L upper trap, scapular musculature, and cervical paraspinals x 12 minutes STM with implementation of effleurage and pettrisage to R flank x 6 minutes   L scapular retraction and depression with overpressure 6 minutes   TherEx  dynadisc df/pf 10x each direction each LE Single limb Red Thera-Band hip abduction 15 times each  lower extremity Single limb Red Thera-Band hip AB duction 15 times each lower extremity Seated gluteal squeeze 10x 3 second holds  RTB ER 10x  RTB inverse ER 10x bilateral Ue's  Towel SNAG side bend for symptom relief 8x  Ice pack donned throughout session to minimize exacerbation of MS symptoms in order to perform interventions.      Pt educated throughout session about proper posture and technique with exercises. Improved exercise technique, movement at target joints, use of target muscles after min to mod verbal, visual, tactile cues    Patient presents with L shoulder pain that is causing numbness and tingling throughout her L arm. Also has R flank pain. Patient tolerates with manual well and reports relief of symptoms by end of session. Patient tolerated LE strengthening well with no increase in symptoms in flank pain. The patient would benefit from continued skilled PT intervention to maximize QOL, safety, and function                 PT Education - 03/29/21 0946     Education provided Yes    Education Details exercise technique, body mechanics    Person(s) Educated Patient    Methods Explanation;Demonstration;Tactile cues;Verbal cues    Comprehension Verbalized understanding;Returned demonstration;Verbal cues required;Tactile cues required              PT Short Term Goals - 03/15/21 1031       PT SHORT TERM GOAL #1   Title Patient will be independent in home exercise program to improve strength/mobility for better functional independence with ADLs.    Baseline 10/4 HEP provided 11/8: HEP compliant    Time 4    Period Weeks    Status Achieved    Target Date 03/08/21               PT Long Term Goals - 03/15/21 0001       PT LONG TERM GOAL #1   Title Patient will increase FOTO score to equal to or greater than  51/100   to demonstrate statistically significant improvement in mobility and quality of life.    Baseline 10/4: 42% 11/8: 36%    Time  12    Period Weeks    Status On-going    Target Date 05/03/21      PT LONG TERM GOAL #2   Title Patient (< 74 years old) will complete five times sit to stand test in < 20 seconds indicating an increased LE strength and improved balance.    Baseline 10/4: 33.15 seconds with heavy BUE support 11/8: 30 seconds    Time 12    Period Weeks  Status Partially Met    Target Date 05/03/21      PT LONG TERM GOAL #3   Title Patient will increase 10 meter walk test to <20 seconds as to improve gait speed for better community ambulation and to reduce fall risk.    Baseline 10/4: 43.17 seconds with RW 11/8: 40.8 seconds    Time 12    Period Weeks    Status Partially Met    Target Date 05/03/21      PT LONG TERM GOAL #4   Title Patient will reduce timed up and go to <11 seconds to reduce fall risk and demonstrate improved transfer/gait ability.    Baseline 10/4: 52.66 seconds with RW 11/8: 49 seconds with RW    Time 12    Period Weeks    Status Partially Met    Target Date 05/03/21                   Plan - 03/29/21 1117     Clinical Impression Statement Patient presents with L shoulder pain that is causing numbness and tingling throughout her L arm. Also has R flank pain. Patient tolerates with manual well and reports relief of symptoms by end of session. Patient tolerated LE strengthening well with no increase in symptoms in flank pain. The patient would benefit from continued skilled PT intervention to maximize QOL, safety, and function    Personal Factors and Comorbidities Age;Comorbidity 3+;Finances;Fitness;Past/Current Experience;Sex;Social Background;Time since onset of injury/illness/exacerbation;Transportation    Comorbidities HTN, SVT, neurogenic bowel, MS (1995), neuropathy, hx of wrist fx, HPV, Hypercholesteremia, adiposity, osteopenia, spasticity.    Examination-Activity Limitations Bathing;Bed Mobility;Bend;Carry;Continence;Dressing;Hygiene/Grooming;Stairs;Squat;Reach  Overhead;Locomotion Level;Lift;Stand;Transfers;Toileting    Examination-Participation Restrictions Church;Cleaning;Community Activity;Laundry;Volunteer;Shop;Meal Prep;Yard Work    Merchant [employer] officer Evolving/Moderate complexity    Rehab Potential Good    PT Frequency 2x / week    PT Duration 12 weeks    PT Treatment/Interventions ADLs/Self Care Home Management;Aquatic Therapy;Biofeedback;Cryotherapy;Electrical Stimulation;Iontophoresis 2m/ml Dexamethasone;Traction;Moist Heat;Ultrasound;DME Instruction;Gait training;Stair training;Functional mobility training;Therapeutic activities;Therapeutic exercise;Balance training;Neuromuscular re-education;Patient/family education;Orthotic Fit/Training;Wheelchair mobility training;Manual techniques;Compression bandaging;Passive range of motion;Dry needling;Energy conservation;Taping;Splinting;Vestibular    PT Next Visit Plan in // bars weight shift and LE strengthening    PT Home Exercise Plan HEP given    Consulted and Agree with Plan of Care Patient             Patient will benefit from skilled therapeutic intervention in order to improve the following deficits and impairments:  Abnormal gait, Decreased activity tolerance, Decreased balance, Decreased coordination, Decreased endurance, Decreased mobility, Decreased range of motion, Decreased strength, Difficulty walking, Impaired perceived functional ability, Impaired sensation, Impaired tone, Impaired UE functional use, Improper body mechanics, Postural dysfunction, Cardiopulmonary status limiting activity, Impaired flexibility, Increased muscle spasms  Visit Diagnosis: Muscle weakness (generalized)  Unsteadiness on feet  Other abnormalities of gait and mobility     Problem List Patient Active Problem List   Diagnosis Date Noted   Acute respiratory disease due to COVID-19 virus 11/21/2020   Weakness    Hypoalbuminemia due to protein-calorie malnutrition (HCC)    Neurogenic  bowel    Neurogenic bladder    Labile blood pressure    Neuropathic pain    Abscess of female pelvis    SVT (supraventricular tachycardia) (HCC)    Radial styloid tenosynovitis 03/12/2018   Wheelchair confinement 02/27/2018   Localized osteoporosis with current pathological fracture with routine healing 01/19/2017   Wrist fracture 01/16/2017   Sprain of ankle 03/23/2016   Closed fracture of lateral malleolus  03/16/2016   Health care maintenance 01/24/2016   Blood pressure elevated without history of HTN 10/25/2015   Essential hypertension 10/25/2015   Multiple sclerosis ([place]) 10/02/2015   Chronic left shoulder pain 07/19/2015   Multiple sclerosis exacerbation ([place]) 07/14/2015   MS (multiple sclerosis) ([place]) 11/26/2014   Increased body mass index 11/26/2014   HPV test positive 11/26/2014   Status post laparoscopic supracervical hysterectomy 11/26/2014   Galactorrhea 11/26/2014   Back ache 05/21/2014   Adiposity 05/21/2014   Disordered sleep 05/21/2014   Muscle spasticity 05/21/2014   Spasticity 05/21/2014   Calculus of kidney 56/43/3295   Renal colic 18/84/1660   Hypercholesteremia 08/19/2013   Hereditary and idiopathic neuropathy 08/19/2013   Hypercholesterolemia without hypertriglyceridemia 08/19/2013   Bladder infection, chronic 07/25/2012   Disorder of bladder function 07/25/2012   Incomplete bladder emptying 07/25/2012   Microscopic hematuria 07/25/2012   Right upper quadrant pain 07/25/2012    Janna Arch, PT, DPT  03/29/2021, 11:19 AM  [place] MAIN [facility] SERVICES [address] [place], [place], [address] Phone: [phone]   Fax:  [phone]  Name: Lisa Williams MRN: [id number] Date of Birth: [birth year]

## 2021-04-04 ENCOUNTER — Telehealth: Payer: Self-pay | Admitting: Neurology

## 2021-04-04 ENCOUNTER — Other Ambulatory Visit: Payer: Self-pay | Admitting: *Deleted

## 2021-04-04 DIAGNOSIS — G35 Multiple sclerosis: Secondary | ICD-10-CM

## 2021-04-04 NOTE — Telephone Encounter (Signed)
Pt called states her hospital bed broke over the weekend. [employer] told her she is entitled for a new one since hers was 59 years old. Pt asking [place] to fax over a new prescription for a bed.

## 2021-04-04 NOTE — Telephone Encounter (Signed)
Called pt. Current bed broke, over 59 years old. Needs rx for new one. Had gel overlay, semi-automatic, half rails. I placed order and notified [employer] that order placed via community message in epic to [employer].

## 2021-04-05 ENCOUNTER — Other Ambulatory Visit: Payer: Self-pay

## 2021-04-05 ENCOUNTER — Ambulatory Visit: Payer: PPO

## 2021-04-05 DIAGNOSIS — M6281 Muscle weakness (generalized): Secondary | ICD-10-CM

## 2021-04-05 DIAGNOSIS — R2689 Other abnormalities of gait and mobility: Secondary | ICD-10-CM

## 2021-04-05 DIAGNOSIS — R262 Difficulty in walking, not elsewhere classified: Secondary | ICD-10-CM | POA: Diagnosis not present

## 2021-04-05 DIAGNOSIS — R2681 Unsteadiness on feet: Secondary | ICD-10-CM

## 2021-04-05 NOTE — Progress Notes (Signed)
04/07/21 8:23 AM   Lisa Williams 1961/03/23 [id number]  Referring provider:  Lauro Regulus, MD [address] [facility] [address] [facility] [place] [facility] [place],  [place] [address] Chief Complaint  Patient presents with   Over Active Bladder     HPI: Lisa Williams is a 60 y.o.female with a personal history of  OAB, MS, and urinary frequency, who presents today for 1 year follow-up with PVR.   She was seen in May of this year by Michiel Cowboy, PA-C, for her dysuria.  UA at the time was negative.  No further burning.    She is currently managed on oxybutynin 10 mg XL.   She is doing well she reports that she had Covid-19 in July of this year. She has not had any bladder problems with minimal incontinence. She has had improvement in her bowel movements as well.    PMH: Past Medical History:  Diagnosis Date   Abdominal pain, right upper quadrant    Back pain    Calculus of kidney 12/09/2013   Chronic back pain    unspecified   Chronic left shoulder pain 07/19/2015   Complication of anesthesia    Functional disorder of bladder    other   Galactorrhea 11/26/2014   Chronic    Hereditary and idiopathic neuropathy 08/19/2013   HPV test positive    Hypercholesteremia 08/19/2013   Hypertension    Incomplete bladder emptying    Microscopic hematuria    MS (multiple sclerosis) (HCC)    Muscle spasticity 05/21/2014   Nonspecific findings on examination of urine    other   Osteopenia    PONV (postoperative nausea and vomiting)    Status post laparoscopic supracervical hysterectomy 11/26/2014   Tobacco user 11/26/2014   Wrist fracture     Surgical History: Past Surgical History:  Procedure Laterality Date   bilateral tubal ligation  1996   BREAST CYST EXCISION Left 2002   FRACTURE SURGERY     KNEE SURGERY     right   LAPAROSCOPIC SUPRACERVICAL HYSTERECTOMY  08/05/2013   ORIF WRIST FRACTURE Left 01/17/2017   Procedure: OPEN REDUCTION INTERNAL FIXATION (ORIF)  WRIST FRACTURE;  Surgeon: Lyndle Herrlich, MD;  Location: [facility] ORS;  Service: Orthopedics;  Laterality: Left;   RADIOLOGY WITH ANESTHESIA N/A 03/18/2020   Procedure: MRI WITH ANESTHESIA CERVICAL SPINE AND BRAIN  WITH AND WITHOUT CONTRAST;  Surgeon: Radiologist, Medication, MD;  Location: [facility];  Service: Radiology;  Laterality: N/A;   TUBAL LIGATION Bilateral    VAGINAL HYSTERECTOMY  03/2006    Home Medications:  Allergies as of 04/06/2021       Reactions   Amlodipine Swelling   Povidone Iodine Rash        Medication List        Accurate as of April 06, 2021 11:59 PM. If you have any questions, ask your nurse or doctor.          STOP taking these medications    predniSONE 10 MG tablet Commonly known as: DELTASONE Stopped by: Vanna Scotland, MD       TAKE these medications    acetaminophen 500 MG tablet Commonly known as: TYLENOL Take 500-1,000 mg by mouth every 6 (six) hours as needed for moderate pain or headache.   baclofen 20 MG tablet Commonly known as: LIORESAL Take 1 tablet (20 mg total) by mouth 3 (three) times daily.   Cholecalciferol 25 MCG (1000 UT) tablet Take 2,000 Units by mouth daily.  gabapentin 100 MG capsule Commonly known as: NEURONTIN Take 1 capsule (100 mg total) by mouth 2 (two) times daily.   ibandronate 150 MG tablet Commonly known as: BONIVA Take 150 mg by mouth every 30 (thirty) days.   Mayzent 2 MG Tabs Generic drug: Siponimod Fumarate Take 2 mg by mouth daily.   oxybutynin 10 MG 24 hr tablet Commonly known as: DITROPAN-XL Take 1 tablet (10 mg total) by mouth daily.   telmisartan 40 MG tablet Commonly known as: MICARDIS Take 40 mg by mouth daily.   tiZANidine 2 MG tablet Commonly known as: ZANAFLEX Take 1 tablet (2 mg total) by mouth every 6 (six) hours as needed for muscle spasms.        Allergies:  Allergies  Allergen Reactions   Amlodipine Swelling   Povidone Iodine Rash    Family History: Family  History  Problem Relation Age of Onset   Sickle cell trait Sister    Hypertension Sister    Supraventricular tachycardia Sister    Hypertension Sister    Diabetes Maternal Grandmother    Liver cancer Maternal Grandmother    Diabetes Maternal Aunt    Breast cancer Maternal Aunt        great MAT   Ovarian cancer Paternal Grandmother    Nephrolithiasis Father    Hypertension Father    Prostate cancer Father    Kidney disease Father    Heart failure Father    Hypertension Sister    Osteoarthritis Mother    Hypertension Mother    Hyperlipidemia Mother    GU problems Neg Hx    Urolithiasis Neg Hx     Social History:  reports that she has quit smoking. Her smoking use included cigarettes. She smoked an average of .5 packs per day. She has never used smokeless tobacco. She reports that she does not drink alcohol and does not use drugs.   Physical Exam: BP 118/68   Pulse 63   Ht 5\' 11"  (1.803 m)   Wt 273 lb (123.8 kg)   BMI 38.08 kg/m   Constitutional:  Alert and oriented, No acute distress. HEENT: [place] AT, moist mucus membranes.  Trachea midline, no masses. Cardiovascular: No clubbing, cyanosis, or edema. Respiratory: Normal respiratory effort, no increased work of breathing. Skin: No rashes, bruises or suspicious lesions. Neurologic: Grossly intact, no focal deficits, moving all 4 extremities. Psychiatric: Normal mood and affect.  Laboratory Data:  Lab Results  Component Value Date   CREATININE 0.69 03/03/2021    Lab Results  Component Value Date   HGBA1C 5.1 11/29/2016    Pertinent Imaging: Results for orders placed or performed in visit on 04/06/21  BLADDER SCAN AMB NON-IMAGING  Result Value Ref Range   Scan Result 0 ml     Assessment & Plan:    OAB  - Symptoms stable on oxybutynin 10 mg XL without side effects, stable   - Adequate bladder emptying today   - We will continue this regimen,    - Follow up annually for medication refill or adjustments as  needed  - Bladder Scan (Post Void Residual) in office     Return in 1 year for PVR  I,Caelie Remsburg,acting as a scribe for 04/08/21, MD.,have documented all relevant documentation on the behalf of Vanna Scotland, MD,as directed by  Vanna Scotland, MD while in the presence of Vanna Scotland, MD.  I have reviewed the above documentation for accuracy and completeness, and I agree with the above.   Vanna Scotland  Erlene Quan, [place] [address] [place], [place] [address] [phone]

## 2021-04-05 NOTE — Therapy (Signed)
[place] MAIN [facility] SERVICES [address] [place], [place], [address] Phone: [phone]   Fax:  [phone]  Physical Therapy Treatment  Patient Details  Name: Lisa Williams MRN: [id number] Date of Birth: 07/15/1960 Referring Provider (PT): Margette Fast MD   Encounter Date: 04/05/2021   PT End of Session - 04/05/21 1252     Visit Number 14    Number of Visits 24    Date for PT Re-Evaluation 05/03/21    Authorization Type 4/10 PN 11/8    PT Start Time 1015    PT Stop Time 1059    PT Time Calculation (min) 44 min    Equipment Utilized During Treatment Gait belt    Activity Tolerance Patient tolerated treatment well    Behavior During Therapy [facility] for tasks assessed/performed             Past Medical History:  Diagnosis Date   Abdominal pain, right upper quadrant    Back pain    Calculus of kidney 12/09/2013   Chronic back pain    unspecified   Chronic left shoulder pain 07/29/5571   Complication of anesthesia    Functional disorder of bladder    other   Galactorrhea 11/26/2014   Chronic    Hereditary and idiopathic neuropathy 08/19/2013   HPV test positive    Hypercholesteremia 08/19/2013   Hypertension    Incomplete bladder emptying    Microscopic hematuria    MS (multiple sclerosis) (HCC)    Muscle spasticity 05/21/2014   Nonspecific findings on examination of urine    other   Osteopenia    PONV (postoperative nausea and vomiting)    Status post laparoscopic supracervical hysterectomy 11/26/2014   Tobacco user 11/26/2014   Wrist fracture     Past Surgical History:  Procedure Laterality Date   bilateral tubal ligation  1996   BREAST CYST EXCISION Left 2002   FRACTURE SURGERY     KNEE SURGERY     right   LAPAROSCOPIC SUPRACERVICAL HYSTERECTOMY  08/05/2013   ORIF WRIST FRACTURE Left 01/17/2017   Procedure: OPEN REDUCTION INTERNAL FIXATION (ORIF) WRIST FRACTURE;  Surgeon: Lovell Sheehan, MD;  Location:  [facility] ORS;  Service: Orthopedics;  Laterality: Left;   RADIOLOGY WITH ANESTHESIA N/A 03/18/2020   Procedure: MRI WITH ANESTHESIA CERVICAL SPINE AND BRAIN  WITH AND WITHOUT CONTRAST;  Surgeon: Radiologist, Medication, MD;  Location: [facility];  Service: Radiology;  Laterality: N/A;   TUBAL LIGATION Bilateral    VAGINAL HYSTERECTOMY  03/2006    There were no vitals filed for this visit.   Subjective Assessment - 04/05/21 1242     Subjective Patient presents with her wheelchair and walker. No falls or LOB since last session.    Pertinent History Patient is a pleasant 60 year old female who is returning to PT after MS exacerbation and hospitalization for COVID. Patient went to SNF for one month and then received home health PT for a month (8 visits).  PMH includes HTN, SVT, neurogenic bowel, MS (1995), neuropathy, hx of wrist fx, HPV, Hypercholesteremia, adiposity, osteopenia, spasticity. She wears bilateral AFOs and uses a RW and/or wheelchair for mobility. She drives an adapted car.    Limitations Lifting;Standing;Walking;House hold activities    How long can you sit comfortably? n/a    How long can you stand comfortably? 2 minutes    How long can you walk comfortably? 40 ft in house    Patient Stated Goals to return to  [facility] prior to hospitalization    Currently in Pain? No/denies              Standing:  Ambulate 89 ft with BRW, CGA, and wheelchair follow with two seated rest breaks. Patient has good clearance of bilateral feet until fatigue requires excessive UE support.       Seated:  Single limb Red Thera-Band hip abduction 15 times each lower extremity Single limb Red Thera-Band hip AB duction 15 times each lower extremity hand clap for coordination sequencing and core activation 60 seconds RTB  ER with abduction 12x Lateral reach for cone and place on opposite side of body 2x each side; 5 cones each Modified windmill 10x seated  Pt educated throughout session about proper  posture and technique with exercises. Improved exercise technique, movement at target joints, use of target muscles after min to mod verbal, visual, tactile cues.   Patient tolerates ambulation with improved foot clearance prior to fatigue. She has decreased episodes of "plopping"in chair with seated rest breaks indicating improved mobility and control with transfers. Her core stability is improving with decreased trunk movement noted with upper extremity strengthening techniques. The patient would benefit from continued skilled PT intervention to maximize QOL, safety, and function                  PT Education - 04/05/21 1252     Education provided Yes    Education Details exercise technique, body mechanics    Person(s) Educated Patient    Methods Explanation;Demonstration;Tactile cues;Verbal cues    Comprehension Verbalized understanding;Returned demonstration;Verbal cues required;Tactile cues required              PT Short Term Goals - 03/15/21 1031       PT SHORT TERM GOAL #1   Title Patient will be independent in home exercise program to improve strength/mobility for better functional independence with ADLs.    Baseline 10/4 HEP provided 11/8: HEP compliant    Time 4    Period Weeks    Status Achieved    Target Date 03/08/21               PT Long Term Goals - 03/15/21 0001       PT LONG TERM GOAL #1   Title Patient will increase FOTO score to equal to or greater than  51/100   to demonstrate statistically significant improvement in mobility and quality of life.    Baseline 10/4: 42% 11/8: 36%    Time 12    Period Weeks    Status On-going    Target Date 05/03/21      PT LONG TERM GOAL #2   Title Patient (< 9 years old) will complete five times sit to stand test in < 20 seconds indicating an increased LE strength and improved balance.    Baseline 10/4: 33.15 seconds with heavy BUE support 11/8: 30 seconds    Time 12    Period Weeks    Status  Partially Met    Target Date 05/03/21      PT LONG TERM GOAL #3   Title Patient will increase 10 meter walk test to <20 seconds as to improve gait speed for better community ambulation and to reduce fall risk.    Baseline 10/4: 43.17 seconds with RW 11/8: 40.8 seconds    Time 12    Period Weeks    Status Partially Met    Target Date 05/03/21      PT LONG  TERM GOAL #4   Title Patient will reduce timed up and go to <11 seconds to reduce fall risk and demonstrate improved transfer/gait ability.    Baseline 10/4: 52.66 seconds with RW 11/8: 49 seconds with RW    Time 12    Period Weeks    Status Partially Met    Target Date 05/03/21                   Plan - 04/05/21 1434     Clinical Impression Statement Patient tolerates ambulation with improved foot clearance prior to fatigue. She has decreased episodes of "plopping"in chair with seated rest breaks indicating improved mobility and control with transfers. Her core stability is improving with decreased trunk movement noted with upper extremity strengthening techniques. The patient would benefit from continued skilled PT intervention to maximize QOL, safety, and function    Personal Factors and Comorbidities Age;Comorbidity 3+;Finances;Fitness;Past/Current Experience;Sex;Social Background;Time since onset of injury/illness/exacerbation;Transportation    Comorbidities HTN, SVT, neurogenic bowel, MS (1995), neuropathy, hx of wrist fx, HPV, Hypercholesteremia, adiposity, osteopenia, spasticity.    Examination-Activity Limitations Bathing;Bed Mobility;Bend;Carry;Continence;Dressing;Hygiene/Grooming;Stairs;Squat;Reach Overhead;Locomotion Level;Lift;Stand;Transfers;Toileting    Examination-Participation Restrictions Church;Cleaning;Community Activity;Laundry;Volunteer;Shop;Meal Prep;Yard Work    [employer] Evolving/Moderate complexity    Rehab Potential Good    PT Frequency 2x / week    PT Duration 12 weeks    PT  Treatment/Interventions ADLs/Self Care Home Management;Aquatic Therapy;Biofeedback;Cryotherapy;Electrical Stimulation;Iontophoresis 43m/ml Dexamethasone;Traction;Moist Heat;Ultrasound;DME Instruction;Gait training;Stair training;Functional mobility training;Therapeutic activities;Therapeutic exercise;Balance training;Neuromuscular re-education;Patient/family education;Orthotic Fit/Training;Wheelchair mobility training;Manual techniques;Compression bandaging;Passive range of motion;Dry needling;Energy conservation;Taping;Splinting;Vestibular    PT Next Visit Plan in // bars weight shift and LE strengthening    PT Home Exercise Plan HEP given    Consulted and Agree with Plan of Care Patient             Patient will benefit from skilled therapeutic intervention in order to improve the following deficits and impairments:  Abnormal gait, Decreased activity tolerance, Decreased balance, Decreased coordination, Decreased endurance, Decreased mobility, Decreased range of motion, Decreased strength, Difficulty walking, Impaired perceived functional ability, Impaired sensation, Impaired tone, Impaired UE functional use, Improper body mechanics, Postural dysfunction, Cardiopulmonary status limiting activity, Impaired flexibility, Increased muscle spasms  Visit Diagnosis: Muscle weakness (generalized)  Other abnormalities of gait and mobility  Unsteadiness on feet     Problem List Patient Active Problem List   Diagnosis Date Noted   Acute respiratory disease due to COVID-19 virus 11/21/2020   Weakness    Hypoalbuminemia due to protein-calorie malnutrition (HCC)    Neurogenic bowel    Neurogenic bladder    Labile blood pressure    Neuropathic pain    Abscess of female pelvis    SVT (supraventricular tachycardia) (HCC)    Radial styloid tenosynovitis 03/12/2018   Wheelchair confinement 02/27/2018   Localized osteoporosis with current pathological fracture with routine healing 01/19/2017    Wrist fracture 01/16/2017   Sprain of ankle 03/23/2016   Closed fracture of lateral malleolus 03/16/2016   Health care maintenance 01/24/2016   Blood pressure elevated without history of HTN 10/25/2015   Essential hypertension 10/25/2015   Multiple sclerosis ([place] 10/02/2015   Chronic left shoulder pain 07/19/2015   Multiple sclerosis exacerbation ([facility] 07/14/2015   MS (multiple sclerosis) ([place] 11/26/2014   Increased body mass index 11/26/2014   HPV test positive 11/26/2014   Status post laparoscopic supracervical hysterectomy 11/26/2014   Galactorrhea 11/26/2014   Back ache 05/21/2014   Adiposity 05/21/2014   Disordered sleep 05/21/2014   Muscle spasticity  05/21/2014   Spasticity 05/21/2014   Calculus of kidney 63/84/6659   Renal colic 93/57/0177   Hypercholesteremia 08/19/2013   Hereditary and idiopathic neuropathy 08/19/2013   Hypercholesterolemia without hypertriglyceridemia 08/19/2013   Bladder infection, chronic 07/25/2012   Disorder of bladder function 07/25/2012   Incomplete bladder emptying 07/25/2012   Microscopic hematuria 07/25/2012   Right upper quadrant pain 07/25/2012    Janna Arch, PT, DPT  04/05/2021, 2:35 PM  [place] MAIN [facility] SERVICES [address] [place], [place], [address] Phone: [phone]   Fax:  [phone]  Name: Lisa Williams MRN: [id number] Date of Birth: 01-12-1961

## 2021-04-06 ENCOUNTER — Ambulatory Visit (INDEPENDENT_AMBULATORY_CARE_PROVIDER_SITE_OTHER): Payer: PPO | Admitting: Urology

## 2021-04-06 ENCOUNTER — Other Ambulatory Visit: Payer: Self-pay | Admitting: Internal Medicine

## 2021-04-06 VITALS — BP 118/68 | HR 63 | Ht 71.0 in | Wt 273.0 lb

## 2021-04-06 DIAGNOSIS — N3281 Overactive bladder: Secondary | ICD-10-CM

## 2021-04-06 DIAGNOSIS — Z1231 Encounter for screening mammogram for malignant neoplasm of breast: Secondary | ICD-10-CM

## 2021-04-06 LAB — BLADDER SCAN AMB NON-IMAGING: Scan Result: 0

## 2021-04-06 MED ORDER — OXYBUTYNIN CHLORIDE ER 10 MG PO TB24: 10.0000 mg | ORAL_TABLET | Freq: Every day | ORAL | 3 refills | Status: DC

## 2021-04-07 ENCOUNTER — Ambulatory Visit: Payer: PPO | Attending: Neurology

## 2021-04-07 ENCOUNTER — Other Ambulatory Visit: Payer: Self-pay

## 2021-04-07 DIAGNOSIS — R2681 Unsteadiness on feet: Secondary | ICD-10-CM | POA: Diagnosis not present

## 2021-04-07 DIAGNOSIS — R278 Other lack of coordination: Secondary | ICD-10-CM | POA: Insufficient documentation

## 2021-04-07 DIAGNOSIS — M6281 Muscle weakness (generalized): Secondary | ICD-10-CM | POA: Diagnosis not present

## 2021-04-07 DIAGNOSIS — R2689 Other abnormalities of gait and mobility: Secondary | ICD-10-CM | POA: Diagnosis not present

## 2021-04-07 NOTE — Therapy (Signed)
[place] MAIN [facility] SERVICES [place], [place], [address] Phone: [phone]   Fax:  [phone]  Physical Therapy Treatment  Patient Details  Name: Lisa Williams MRN: [id number] Date of Birth: 07-09-1960 Referring Provider (PT): Margette Fast MD   Encounter Date: 04/07/2021   PT End of Session - 04/07/21 0958     Visit Number 15    Number of Visits 24    Date for PT Re-Evaluation 05/03/21    Authorization Type 5/10 PN 11/8    PT Start Time 1015    PT Stop Time 1058    PT Time Calculation (min) 43 min    Equipment Utilized During Treatment Gait belt    Activity Tolerance Patient tolerated treatment well    Behavior During Therapy [facility] for tasks assessed/performed             Past Medical History:  Diagnosis Date   Abdominal pain, right upper quadrant    Back pain    Calculus of kidney 12/09/2013   Chronic back pain    unspecified   Chronic left shoulder pain 1/32/4401   Complication of anesthesia    Functional disorder of bladder    other   Galactorrhea 11/26/2014   Chronic    Hereditary and idiopathic neuropathy 08/19/2013   HPV test positive    Hypercholesteremia 08/19/2013   Hypertension    Incomplete bladder emptying    Microscopic hematuria    MS (multiple sclerosis) (HCC)    Muscle spasticity 05/21/2014   Nonspecific findings on examination of urine    other   Osteopenia    PONV (postoperative nausea and vomiting)    Status post laparoscopic supracervical hysterectomy 11/26/2014   Tobacco user 11/26/2014   Wrist fracture     Past Surgical History:  Procedure Laterality Date   bilateral tubal ligation  1996   BREAST CYST EXCISION Left 2002   FRACTURE SURGERY     KNEE SURGERY     right   LAPAROSCOPIC SUPRACERVICAL HYSTERECTOMY  08/05/2013   ORIF WRIST FRACTURE Left 01/17/2017   Procedure: OPEN REDUCTION INTERNAL FIXATION (ORIF) WRIST FRACTURE;  Surgeon: Lovell Sheehan, MD;  Location:  [facility];  Service: Orthopedics;  Laterality: Left;   RADIOLOGY WITH ANESTHESIA N/A 03/18/2020   Procedure: MRI WITH ANESTHESIA CERVICAL SPINE AND BRAIN  WITH AND WITHOUT CONTRAST;  Surgeon: Radiologist, Medication, MD;  Location: [place];  Service: Radiology;  Laterality: N/A;   TUBAL LIGATION Bilateral    VAGINAL HYSTERECTOMY  03/2006    There were no vitals filed for this visit.   Subjective Assessment - 04/07/21 1111     Subjective Patient reports no falls or LOB since last session. Is feeling good today, only having mild spasms in back.    Pertinent History Patient is a pleasant 60 year old female who is returning to PT after MS exacerbation and hospitalization for COVID. Patient went to SNF for one month and then received home health PT for a month (8 visits).  PMH includes HTN, SVT, neurogenic bowel, MS (1995), neuropathy, hx of wrist fx, HPV, Hypercholesteremia, adiposity, osteopenia, spasticity. She wears bilateral AFOs and uses a RW and/or wheelchair for mobility. She drives an adapted car.    Limitations Lifting;Standing;Walking;House hold activities    How long can you sit comfortably? n/a    How long can you stand comfortably? 2 minutes    How long can you walk comfortably? 40 ft in house    Patient  Stated Goals to return to PLOF prior to hospitalization    Currently in Pain? No/denies                    Standing:   -Standing weight shifts 2x10 each side; UE support focus on hip shift rather than pull with arm -Hip flexion into green Thera-Band across parallel bars 10 times each lower extremity. -cross body standing taps with SPT with CGA 60 seconds      Seated:  Seated boxing: cues for sequencing, body mechanics, and pace/rhythm for functional contraction and timing of muscle recruitment: -Cross body punches to mitts on PT hands with no back support for focused core stabilization with crossing midline 2x 60 seconds  -Cross body punch with secondary combination of  elbow for full cross body rotation to PT mitt for trunk stability, coordination, and cardiovascular challenge x 60 seconds -Upper cut to mitts in PT hands for core activation without back support with perturbations 60 seconds b  Large swiss ball forward trunk stretch 10x Hamstring stretch 30 seconds each LE x 2 sets  Isometric contraction into PT hand: 10x 5 second holds with PT cueing patient for breathing and controlled contraction -adduction, abduction, flexion  Ice pack donned throughout session to minimize exacerbation of MS symptoms in order to perform interventions.     Pt educated throughout session about proper posture and technique with exercises. Improved exercise technique, movement at target joints, use of target muscles after min to mod verbal, visual, tactile cues.   Patient tolerates progressive standing tolerance and stability interventions well. Her core stabilization is improving as can be seen in ability to perform seated boxing with decreased need for rest breaks. Patient has decreased muscle spasms by end of session. The patient would benefit from continued skilled PT intervention to maximize QOL, safety, and function                 PT Education - 04/07/21 0958     Education provided Yes    Education Details exercise technique, body mechanics    Person(s) Educated Patient    Methods Explanation;Demonstration;Tactile cues;Verbal cues    Comprehension Verbalized understanding;Returned demonstration;Verbal cues required;Tactile cues required              PT Short Term Goals - 03/15/21 1031       PT SHORT TERM GOAL #1   Title Patient will be independent in home exercise program to improve strength/mobility for better functional independence with ADLs.    Baseline 10/4 HEP provided 11/8: HEP compliant    Time 4    Period Weeks    Status Achieved    Target Date 03/08/21               PT Long Term Goals - 03/15/21 0001       PT LONG  TERM GOAL #1   Title Patient will increase FOTO score to equal to or greater than  51/100   to demonstrate statistically significant improvement in mobility and quality of life.    Baseline 10/4: 42% 11/8: 36%    Time 12    Period Weeks    Status On-going    Target Date 05/03/21      PT LONG TERM GOAL #2   Title Patient (< 74 years old) will complete five times sit to stand test in < 20 seconds indicating an increased LE strength and improved balance.    Baseline 10/4: 33.15 seconds with heavy BUE support 11/8:  30 seconds    Time 12    Period Weeks    Status Partially Met    Target Date 05/03/21      PT LONG TERM GOAL #3   Title Patient will increase 10 meter walk test to <20 seconds as to improve gait speed for better community ambulation and to reduce fall risk.    Baseline 10/4: 43.17 seconds with RW 11/8: 40.8 seconds    Time 12    Period Weeks    Status Partially Met    Target Date 05/03/21      PT LONG TERM GOAL #4   Title Patient will reduce timed up and go to <11 seconds to reduce fall risk and demonstrate improved transfer/gait ability.    Baseline 10/4: 52.66 seconds with RW 11/8: 49 seconds with RW    Time 12    Period Weeks    Status Partially Met    Target Date 05/03/21                   Plan - 04/07/21 1113     Clinical Impression Statement Patient tolerates progressive standing tolerance and stability interventions well. Her core stabilization is improving as can be seen in ability to perform seated boxing with decreased need for rest breaks. Patient has decreased muscle spasms by end of session. The patient would benefit from continued skilled PT intervention to maximize QOL, safety, and function    Personal Factors and Comorbidities Age;Comorbidity 3+;Finances;Fitness;Past/Current Experience;Sex;Social Background;Time since onset of injury/illness/exacerbation;Transportation    Comorbidities HTN, SVT, neurogenic bowel, MS (1995), neuropathy, hx of wrist  fx, HPV, Hypercholesteremia, adiposity, osteopenia, spasticity.    Examination-Activity Limitations Bathing;Bed Mobility;Bend;Carry;Continence;Dressing;Hygiene/Grooming;Stairs;Squat;Reach Overhead;Locomotion Level;Lift;Stand;Transfers;Toileting    Examination-Participation Restrictions Church;Cleaning;Community Activity;Laundry;Volunteer;Shop;Meal Prep;Yard Work    [employer] officer Evolving/Moderate complexity    Rehab Potential Good    PT Frequency 2x / week    PT Duration 12 weeks    PT Treatment/Interventions ADLs/Self Care Home Management;Aquatic Therapy;Biofeedback;Cryotherapy;Electrical Stimulation;Iontophoresis 26m/ml Dexamethasone;Traction;Moist Heat;Ultrasound;DME Instruction;Gait training;Stair training;Functional mobility training;Therapeutic activities;Therapeutic exercise;Balance training;Neuromuscular re-education;Patient/family education;Orthotic Fit/Training;Wheelchair mobility training;Manual techniques;Compression bandaging;Passive range of motion;Dry needling;Energy conservation;Taping;Splinting;Vestibular    PT Next Visit Plan in // bars weight shift and LE strengthening    PT Home Exercise Plan HEP given    Consulted and Agree with Plan of Care Patient             Patient will benefit from skilled therapeutic intervention in order to improve the following deficits and impairments:  Abnormal gait, Decreased activity tolerance, Decreased balance, Decreased coordination, Decreased endurance, Decreased mobility, Decreased range of motion, Decreased strength, Difficulty walking, Impaired perceived functional ability, Impaired sensation, Impaired tone, Impaired UE functional use, Improper body mechanics, Postural dysfunction, Cardiopulmonary status limiting activity, Impaired flexibility, Increased muscle spasms  Visit Diagnosis: Muscle weakness (generalized)  Other abnormalities of gait and mobility  Unsteadiness on feet     Problem List Patient Active  Problem List   Diagnosis Date Noted   Acute respiratory disease due to COVID-19 virus 11/21/2020   Weakness    Hypoalbuminemia due to protein-calorie malnutrition (HCC)    Neurogenic bowel    Neurogenic bladder    Labile blood pressure    Neuropathic pain    Abscess of female pelvis    SVT (supraventricular tachycardia) (HCC)    Radial styloid tenosynovitis 03/12/2018   Wheelchair confinement 02/27/2018   Localized osteoporosis with current pathological fracture with routine healing 01/19/2017   Wrist fracture 01/16/2017   Sprain of ankle 03/23/2016  Closed fracture of lateral malleolus 03/16/2016   Health care maintenance 01/24/2016   Blood pressure elevated without history of HTN 10/25/2015   Essential hypertension 10/25/2015   Multiple sclerosis ([place]) 10/02/2015   Chronic left shoulder pain 07/19/2015   Multiple sclerosis exacerbation ([place]) 07/14/2015   MS (multiple sclerosis) ([place]) 11/26/2014   Increased body mass index 11/26/2014   HPV test positive 11/26/2014   Status post laparoscopic supracervical hysterectomy 11/26/2014   Galactorrhea 11/26/2014   Back ache 05/21/2014   Adiposity 05/21/2014   Disordered sleep 05/21/2014   Muscle spasticity 05/21/2014   Spasticity 05/21/2014   Calculus of kidney 75/30/0511   Renal colic 06/18/1733   Hypercholesteremia 08/19/2013   Hereditary and idiopathic neuropathy 08/19/2013   Hypercholesterolemia without hypertriglyceridemia 08/19/2013   Bladder infection, chronic 07/25/2012   Disorder of bladder function 07/25/2012   Incomplete bladder emptying 07/25/2012   Microscopic hematuria 07/25/2012   Right upper quadrant pain 07/25/2012    Janna Arch, PT, DPT  04/07/2021, 11:13 AM  [place] MAIN [facility] SERVICES [address] [place], [place], [address] Phone: [phone]   Fax:  [phone]  Name: Lisa Williams MRN: [id number] Date of Birth: 01/28/1961

## 2021-04-12 ENCOUNTER — Ambulatory Visit: Payer: PPO

## 2021-04-12 ENCOUNTER — Other Ambulatory Visit: Payer: Self-pay

## 2021-04-12 DIAGNOSIS — R2689 Other abnormalities of gait and mobility: Secondary | ICD-10-CM

## 2021-04-12 DIAGNOSIS — M6281 Muscle weakness (generalized): Secondary | ICD-10-CM

## 2021-04-12 DIAGNOSIS — R2681 Unsteadiness on feet: Secondary | ICD-10-CM

## 2021-04-12 NOTE — Therapy (Signed)
[place] MAIN [facility] SERVICES [address] [place], [place], [address] Phone: [phone]   Fax:  [phone]  Physical Therapy Treatment  Patient Details  Name: Lisa Williams MRN: [id number] Date of Birth: [birth year] Referring Provider (Lisa Williams): Margette Fast MD   Encounter Date: 04/12/2021   Lisa Williams End of Session - 04/12/21 1220     Visit Number 16    Number of Visits 24    Date for Lisa Williams Re-Evaluation 05/03/21    Authorization Type 6/10 PN 11/8    Lisa Williams Start Time 1015    Lisa Williams Stop Time 1059    Lisa Williams Time Calculation (min) 44 min    Equipment Utilized During Treatment Gait belt    Activity Tolerance Patient tolerated treatment well    Behavior During Therapy [facility] for tasks assessed/performed             Past Medical History:  Diagnosis Date   Abdominal pain, right upper quadrant    Back pain    Calculus of kidney 12/09/2013   Chronic back pain    unspecified   Chronic left shoulder pain 11/02/3660   Complication of anesthesia    Functional disorder of bladder    other   Galactorrhea 11/26/2014   Chronic    Hereditary and idiopathic neuropathy 08/19/2013   HPV test positive    Hypercholesteremia 08/19/2013   Hypertension    Incomplete bladder emptying    Microscopic hematuria    MS (multiple sclerosis) (HCC)    Muscle spasticity 05/21/2014   Nonspecific findings on examination of urine    other   Osteopenia    PONV (postoperative nausea and vomiting)    Status post laparoscopic supracervical hysterectomy 11/26/2014   Tobacco user 11/26/2014   Wrist fracture     Past Surgical History:  Procedure Laterality Date   bilateral tubal ligation  1996   BREAST CYST EXCISION Left 2002   FRACTURE SURGERY     KNEE SURGERY     right   LAPAROSCOPIC SUPRACERVICAL HYSTERECTOMY  08/05/2013   ORIF WRIST FRACTURE Left 01/17/2017   Procedure: OPEN REDUCTION INTERNAL FIXATION (ORIF) WRIST FRACTURE;  Surgeon: Lovell Sheehan, MD;  Location:  [facility];  Service: Orthopedics;  Laterality: Left;   RADIOLOGY WITH ANESTHESIA N/A 03/18/2020   Procedure: MRI WITH ANESTHESIA CERVICAL SPINE AND BRAIN  WITH AND WITHOUT CONTRAST;  Surgeon: Radiologist, Medication, MD;  Location: [place];  Service: Radiology;  Laterality: N/A;   TUBAL LIGATION Bilateral    VAGINAL HYSTERECTOMY  03/2006    There were no vitals filed for this visit.   Subjective Assessment - 04/12/21 1217     Subjective Patient reports she is feeling good today. Has been compliant with HEP, went shopping saturday.    Pertinent History Patient is a pleasant 60 year old female who is returning to [facility] after MS exacerbation and hospitalization for COVID. Patient went to SNF for one month and then received home health Lisa Williams for a month (8 visits).  PMH includes HTN, SVT, neurogenic bowel, MS (1995), neuropathy, hx of wrist fx, HPV, Hypercholesteremia, adiposity, osteopenia, spasticity. She wears bilateral AFOs and uses a RW and/or wheelchair for mobility. She drives an adapted car.    Limitations Lifting;Standing;Walking;House hold activities    How long can you sit comfortably? n/a    How long can you stand comfortably? 2 minutes    How long can you walk comfortably? 40 ft in house    Patient Stated Goals to return  to PLOF prior to hospitalization    Currently in Pain? No/denies              Standing in // bars: -standing weight shift 10x each direction -modified squat 5x   Sit to stands from raised plinth table: cues for pushing with LE"s and squeezing gluteal musculature 5x first trial, 6 x second trial; rest break in between  Seated: Hamstring stretch 30 seconds each LE x 2 sets Single limb Red Thera-Band hip abduction 15 times each lower extremity Single limb Red Thera-Band hip AB duction 15 times each lower extremity hand clap for coordination sequencing and core activation 60 seconds Lateral weight shifts 10x each side    Ice pack donned throughout session to  minimize exacerbation of MS symptoms in order to perform interventions.      Lisa Williams educated throughout session about proper posture and technique with exercises. Improved exercise technique, movement at target joints, use of target muscles after min to mod verbal, visual, tactile cues     Patient tolerated progressive sit to stand technique with focus on LE recruitment rather than compensatory UE pressing. She requires frequent cueing and fatigues quickly indicating this will continue to be an area of focus in future sessions. She remains highly motivated throughout physical therapy session. The patient would benefit from continued skilled Lisa Williams intervention to maximize QOL, safety, and function                  Lisa Williams Education - 04/12/21 1218     Education provided Yes    Education Details exercise technique, body mechanics    Person(s) Educated Patient    Methods Explanation;Demonstration;Tactile cues;Verbal cues    Comprehension Verbalized understanding;Returned demonstration;Verbal cues required;Tactile cues required              Lisa Williams Short Term Goals - 03/15/21 1031       Lisa Williams SHORT TERM GOAL #1   Title Patient will be independent in home exercise program to improve strength/mobility for better functional independence with ADLs.    Baseline 10/4 HEP provided 11/8: HEP compliant    Time 4    Period Weeks    Status Achieved    Target Date 03/08/21               Lisa Williams Long Term Goals - 03/15/21 0001       Lisa Williams LONG TERM GOAL #1   Title Patient will increase FOTO score to equal to or greater than  51/100   to demonstrate statistically significant improvement in mobility and quality of life.    Baseline 10/4: 42% 11/8: 36%    Time 12    Period Weeks    Status On-going    Target Date 05/03/21      Lisa Williams LONG TERM GOAL #2   Title Patient (< 33 years old) will complete five times sit to stand test in < 20 seconds indicating an increased LE strength and improved balance.     Baseline 10/4: 33.15 seconds with heavy BUE support 11/8: 30 seconds    Time 12    Period Weeks    Status Partially Met    Target Date 05/03/21      Lisa Williams LONG TERM GOAL #3   Title Patient will increase 10 meter walk test to <20 seconds as to improve gait speed for better community ambulation and to reduce fall risk.    Baseline 10/4: 43.17 seconds with RW 11/8: 40.8 seconds    Time 12  Period Weeks    Status Partially Met    Target Date 05/03/21      Lisa Williams LONG TERM GOAL #4   Title Patient will reduce timed up and go to <11 seconds to reduce fall risk and demonstrate improved transfer/gait ability.    Baseline 10/4: 52.66 seconds with RW 11/8: 49 seconds with RW    Time 12    Period Weeks    Status Partially Met    Target Date 05/03/21                   Plan - 04/12/21 1226     Clinical Impression Statement Patient tolerated progressive sit to stand technique with focus on LE recruitment rather than compensatory UE pressing. She requires frequent cueing and fatigues quickly indicating this will continue to be an area of focus in future sessions. She remains highly motivated throughout physical therapy session. The patient would benefit from continued skilled Lisa Williams intervention to maximize QOL, safety, and function    Personal Factors and Comorbidities Age;Comorbidity 3+;Finances;Fitness;Past/Current Experience;Sex;Social Background;Time since onset of injury/illness/exacerbation;Transportation    Comorbidities HTN, SVT, neurogenic bowel, MS (1995), neuropathy, hx of wrist fx, HPV, Hypercholesteremia, adiposity, osteopenia, spasticity.    Examination-Activity Limitations Bathing;Bed Mobility;Bend;Carry;Continence;Dressing;Hygiene/Grooming;Stairs;Squat;Reach Overhead;Locomotion Level;Lift;Stand;Transfers;Toileting    Examination-Participation Restrictions Church;Cleaning;Community Activity;Laundry;Volunteer;Shop;Meal Prep;Yard Work    [employer] officer  Evolving/Moderate complexity    Rehab Potential Good    Lisa Williams Frequency 2x / week    Lisa Williams Duration 12 weeks    Lisa Williams Treatment/Interventions ADLs/Self Care Home Management;Aquatic Therapy;Biofeedback;Cryotherapy;Electrical Stimulation;Iontophoresis 50m/ml Dexamethasone;Traction;Moist Heat;Ultrasound;DME Instruction;Gait training;Stair training;Functional mobility training;Therapeutic activities;Therapeutic exercise;Balance training;Neuromuscular re-education;Patient/family education;Orthotic Fit/Training;Wheelchair mobility training;Manual techniques;Compression bandaging;Passive range of motion;Dry needling;Energy conservation;Taping;Splinting;Vestibular    Lisa Williams Next Visit Plan in // bars weight shift and LE strengthening    Lisa Williams Home Exercise Plan HEP given    Consulted and Agree with Plan of Care Patient             Patient will benefit from skilled therapeutic intervention in order to improve the following deficits and impairments:  Abnormal gait, Decreased activity tolerance, Decreased balance, Decreased coordination, Decreased endurance, Decreased mobility, Decreased range of motion, Decreased strength, Difficulty walking, Impaired perceived functional ability, Impaired sensation, Impaired tone, Impaired UE functional use, Improper body mechanics, Postural dysfunction, Cardiopulmonary status limiting activity, Impaired flexibility, Increased muscle spasms  Visit Diagnosis: Muscle weakness (generalized)  Other abnormalities of gait and mobility  Unsteadiness on feet     Problem List Patient Active Problem List   Diagnosis Date Noted   Acute respiratory disease due to COVID-19 virus 11/21/2020   Weakness    Hypoalbuminemia due to protein-calorie malnutrition (HCC)    Neurogenic bowel    Neurogenic bladder    Labile blood pressure    Neuropathic pain    Abscess of female pelvis    SVT (supraventricular tachycardia) (HCC)    Radial styloid tenosynovitis 03/12/2018   Wheelchair  confinement 02/27/2018   Localized osteoporosis with current pathological fracture with routine healing 01/19/2017   Wrist fracture 01/16/2017   Sprain of ankle 03/23/2016   Closed fracture of lateral malleolus 03/16/2016   Health care maintenance 01/24/2016   Blood pressure elevated without history of HTN 10/25/2015   Essential hypertension 10/25/2015   Multiple sclerosis ([place] 10/02/2015   Chronic left shoulder pain 07/19/2015   [place] ([place] 07/14/2015   [place] 11/26/2014   Increased body mass index 11/26/2014   HPV test positive 11/26/2014   Status post laparoscopic supracervical hysterectomy  11/26/2014   Galactorrhea 11/26/2014   Back ache 05/21/2014   Adiposity 05/21/2014   Disordered sleep 05/21/2014   Muscle spasticity 05/21/2014   Spasticity 05/21/2014   Calculus of kidney 46/96/2952   Renal colic 84/13/2440   Hypercholesteremia 08/19/2013   Hereditary and idiopathic neuropathy 08/19/2013   Hypercholesterolemia without hypertriglyceridemia 08/19/2013   Bladder infection, chronic 07/25/2012   Disorder of bladder function 07/25/2012   Incomplete bladder emptying 07/25/2012   Microscopic hematuria 07/25/2012   Right upper quadrant pain 07/25/2012    Lisa Williams, Lisa Williams, Lisa Williams  04/12/2021, 12:27 PM  [place] MAIN [facility] SERVICES [address] [place], [place], [address] Phone: [phone]   Fax:  [phone]  Name: Lisa Williams MRN: [id number] Date of Birth: [birth year]

## 2021-04-14 ENCOUNTER — Other Ambulatory Visit: Payer: Self-pay

## 2021-04-14 ENCOUNTER — Ambulatory Visit: Payer: PPO

## 2021-04-14 DIAGNOSIS — R2689 Other abnormalities of gait and mobility: Secondary | ICD-10-CM

## 2021-04-14 DIAGNOSIS — M6281 Muscle weakness (generalized): Secondary | ICD-10-CM

## 2021-04-14 DIAGNOSIS — R2681 Unsteadiness on feet: Secondary | ICD-10-CM

## 2021-04-14 NOTE — Therapy (Signed)
[place] MAIN [facility] SERVICES [address] [place], [place], [address] Phone: [phone]   Fax:  [phone]  Physical Therapy Treatment  Patient Details  Name: Lisa Williams MRN: [id number] Date of Birth: 08/06/1960 Referring Provider (PT): Margette Fast MD   Encounter Date: 04/14/2021   PT End of Session - 04/14/21 1122     Visit Number 17    Number of Visits 24    Date for PT Re-Evaluation 05/03/21    Authorization Type 7/10 PN 11/8    PT Start Time 1016    PT Stop Time 1100    PT Time Calculation (min) 44 min    Equipment Utilized During Treatment Gait belt    Activity Tolerance Patient tolerated treatment well    Behavior During Therapy [facility] for tasks assessed/performed             Past Medical History:  Diagnosis Date   Abdominal pain, right upper quadrant    Back pain    Calculus of kidney 12/09/2013   Chronic back pain    unspecified   Chronic left shoulder pain 7/93/9030   Complication of anesthesia    Functional disorder of bladder    other   Galactorrhea 11/26/2014   Chronic    Hereditary and idiopathic neuropathy 08/19/2013   HPV test positive    Hypercholesteremia 08/19/2013   Hypertension    Incomplete bladder emptying    Microscopic hematuria    MS (multiple sclerosis) (HCC)    Muscle spasticity 05/21/2014   Nonspecific findings on examination of urine    other   Osteopenia    PONV (postoperative nausea and vomiting)    Status post laparoscopic supracervical hysterectomy 11/26/2014   Tobacco user 11/26/2014   Wrist fracture     Past Surgical History:  Procedure Laterality Date   bilateral tubal ligation  1996   BREAST CYST EXCISION Left 2002   FRACTURE SURGERY     KNEE SURGERY     right   LAPAROSCOPIC SUPRACERVICAL HYSTERECTOMY  08/05/2013   ORIF WRIST FRACTURE Left 01/17/2017   Procedure: OPEN REDUCTION INTERNAL FIXATION (ORIF) WRIST FRACTURE;  Surgeon: Lovell Sheehan, MD;  Location:  [facility];  Service: Orthopedics;  Laterality: Left;   RADIOLOGY WITH ANESTHESIA N/A 03/18/2020   Procedure: MRI WITH ANESTHESIA CERVICAL SPINE AND BRAIN  WITH AND WITHOUT CONTRAST;  Surgeon: Radiologist, Medication, MD;  Location: [place];  Service: Radiology;  Laterality: N/A;   TUBAL LIGATION Bilateral    VAGINAL HYSTERECTOMY  03/2006    There were no vitals filed for this visit.   Subjective Assessment - 04/14/21 1119     Subjective Patient reports compliance with HEP, is feeling good today. No falls or LOB since last session.    Pertinent History Patient is a pleasant 60 year old female who is returning to PT after MS exacerbation and hospitalization for COVID. Patient went to SNF for one month and then received home health PT for a month (8 visits).  PMH includes HTN, SVT, neurogenic bowel, MS (1995), neuropathy, hx of wrist fx, HPV, Hypercholesteremia, adiposity, osteopenia, spasticity. She wears bilateral AFOs and uses a RW and/or wheelchair for mobility. She drives an adapted car.    Limitations Lifting;Standing;Walking;House hold activities    How long can you sit comfortably? n/a    How long can you stand comfortably? 2 minutes    How long can you walk comfortably? 40 ft in house    Patient Stated Goals to  return to PLOF prior to hospitalization    Currently in Pain? No/denies             Ambulate:  ambulate 120 ft with bRW, CGA, and wheelchair follow. One seated rest break required with good foot clearance initially. Foot clearance and UE reliance increase as patient fatigued.   Seated: Sit to stand with bRW and chair pushed against plinth table; 5x; with one rest break after 3x. Cues for increasing use of LE's Hamstring stretch 30 seconds each LE x 2 sets Single limb Red Thera-Band hip abduction 15 times each lower extremity Single limb Red Thera-Band hip AB duction 15 times each lower extremity RTB row 10x with cue for scapular retraction RTB ER with tactile cueing for  scapular control 10x Lateral weight shifts 10x each side (penguins)     Ice pack donned throughout session to minimize exacerbation of MS symptoms in order to perform interventions.      Pt educated throughout session about proper posture and technique with exercises. Improved exercise technique, movement at target joints, use of target muscles after min to mod verbal, visual, tactile cues   Patient demonstrates improved recruitment of LE musculature during sit to stand transfers and ambulation. She is highly motivated throughout session. Her core stabilization is progressing with decreased fatigue noted with seated interventions and decreased reliance upon back rest. The patient would benefit from continued skilled PT intervention to maximize QOL, safety, and function                    PT Education - 04/14/21 1121     Education provided Yes    Education Details exercise technique, body mechanics    Person(s) Educated Patient    Methods Explanation;Demonstration;Tactile cues;Verbal cues    Comprehension Verbalized understanding;Returned demonstration;Verbal cues required;Tactile cues required              PT Short Term Goals - 03/15/21 1031       PT SHORT TERM GOAL #1   Title Patient will be independent in home exercise program to improve strength/mobility for better functional independence with ADLs.    Baseline 10/4 HEP provided 11/8: HEP compliant    Time 4    Period Weeks    Status Achieved    Target Date 03/08/21               PT Long Term Goals - 03/15/21 0001       PT LONG TERM GOAL #1   Title Patient will increase FOTO score to equal to or greater than  51/100   to demonstrate statistically significant improvement in mobility and quality of life.    Baseline 10/4: 42% 11/8: 36%    Time 12    Period Weeks    Status On-going    Target Date 05/03/21      PT LONG TERM GOAL #2   Title Patient (< 93 years old) will complete five times sit to  stand test in < 20 seconds indicating an increased LE strength and improved balance.    Baseline 10/4: 33.15 seconds with heavy BUE support 11/8: 30 seconds    Time 12    Period Weeks    Status Partially Met    Target Date 05/03/21      PT LONG TERM GOAL #3   Title Patient will increase 10 meter walk test to <20 seconds as to improve gait speed for better community ambulation and to reduce fall risk.  Baseline 10/4: 43.17 seconds with RW 11/8: 40.8 seconds    Time 12    Period Weeks    Status Partially Met    Target Date 05/03/21      PT LONG TERM GOAL #4   Title Patient will reduce timed up and go to <11 seconds to reduce fall risk and demonstrate improved transfer/gait ability.    Baseline 10/4: 52.66 seconds with RW 11/8: 49 seconds with RW    Time 12    Period Weeks    Status Partially Met    Target Date 05/03/21                   Plan - 04/14/21 1123     Clinical Impression Statement Patient demonstrates improved recruitment of LE musculature during sit to stand transfers and ambulation. She is highly motivated throughout session. Her core stabilization is progressing with decreased fatigue noted with seated interventions and decreased reliance upon back rest. The patient would benefit from continued skilled PT intervention to maximize QOL, safety, and function    Personal Factors and Comorbidities Age;Comorbidity 3+;Finances;Fitness;Past/Current Experience;Sex;Social Background;Time since onset of injury/illness/exacerbation;Transportation    Comorbidities HTN, SVT, neurogenic bowel, MS (1995), neuropathy, hx of wrist fx, HPV, Hypercholesteremia, adiposity, osteopenia, spasticity.    Examination-Activity Limitations Bathing;Bed Mobility;Bend;Carry;Continence;Dressing;Hygiene/Grooming;Stairs;Squat;Reach Overhead;Locomotion Level;Lift;Stand;Transfers;Toileting    Examination-Participation Restrictions Church;Cleaning;Community Activity;Laundry;Volunteer;Shop;Meal  Prep;Yard Work    [employer]chant [employer] officer Evolving/Moderate complexity    Rehab Potential Good    PT Frequency 2x / week    PT Duration 12 weeks    PT Treatment/Interventions ADLs/Self Care Home Management;Aquatic Therapy;Biofeedback;Cryotherapy;Electrical Stimulation;Iontophoresis 4mg /ml Dexamethasone;Traction;Moist Heat;Ultrasound;DME Instruction;Gait training;Stair training;Functional mobility training;Therapeutic activities;Therapeutic exercise;Balance training;Neuromuscular re-education;Patient/family education;Orthotic Fit/Training;Wheelchair mobility training;Manual techniques;Compression bandaging;Passive range of motion;Dry needling;Energy conservation;Taping;Splinting;Vestibular    PT Next Visit Plan in // bars weight shift and LE strengthening    PT Home Exercise Plan HEP given    Consulted and Agree with Plan of Care Patient             Patient will benefit from skilled therapeutic intervention in order to improve the following deficits and impairments:  Abnormal gait, Decreased activity tolerance, Decreased balance, Decreased coordination, Decreased endurance, Decreased mobility, Decreased range of motion, Decreased strength, Difficulty walking, Impaired perceived functional ability, Impaired sensation, Impaired tone, Impaired UE functional use, Improper body mechanics, Postural dysfunction, Cardiopulmonary status limiting activity, Impaired flexibility, Increased muscle spasms  Visit Diagnosis: Muscle weakness (generalized)  Other abnormalities of gait and mobility  Unsteadiness on feet     Problem List Patient Active Problem List   Diagnosis Date Noted   Acute respiratory disease due to COVID-19 virus 11/21/2020   Weakness    Hypoalbuminemia due to protein-calorie malnutrition (HCC)    Neurogenic bowel    Neurogenic bladder    Labile blood pressure    Neuropathic pain    Abscess of female pelvis    SVT (supraventricular tachycardia) (HCC)    Radial  styloid tenosynovitis 03/12/2018   Wheelchair confinement 02/27/2018   Localized osteoporosis with current pathological fracture with routine healing 01/19/2017   Wrist fracture 01/16/2017   Sprain of ankle 03/23/2016   Closed fracture of lateral malleolus 03/16/2016   Health care maintenance 01/24/2016   Blood pressure elevated without history of HTN 10/25/2015   Essential hypertension 10/25/2015   Multiple sclerosis ([place]) 10/02/2015   Chronic left shoulder pain 07/19/2015   Multiple sclerosis exacerbation ([place]) 07/14/2015   MS (multiple sclerosis) ([place]) 11/26/2014   Increased body mass index 11/26/2014   HPV  test positive 11/26/2014   Status post laparoscopic supracervical hysterectomy 11/26/2014   Galactorrhea 11/26/2014   Back ache 05/21/2014   Adiposity 05/21/2014   Disordered sleep 05/21/2014   Muscle spasticity 05/21/2014   Spasticity 05/21/2014   Calculus of kidney 40/33/5331   Renal colic 74/01/9277   Hypercholesteremia 08/19/2013   Hereditary and idiopathic neuropathy 08/19/2013   Hypercholesterolemia without hypertriglyceridemia 08/19/2013   Bladder infection, chronic 07/25/2012   Disorder of bladder function 07/25/2012   Incomplete bladder emptying 07/25/2012   Microscopic hematuria 07/25/2012   Right upper quadrant pain 07/25/2012    Janna Arch, PT, DPT  04/14/2021, 11:24 AM  [place] MAIN [facility] SERVICES [address] [place], [place], [address] Phone: [phone]   Fax:  [phone]  Name: Lisa Williams MRN: [id number] Date of Birth: June 04, 1960

## 2021-04-19 ENCOUNTER — Ambulatory Visit: Payer: PPO

## 2021-04-19 ENCOUNTER — Other Ambulatory Visit: Payer: Self-pay

## 2021-04-19 DIAGNOSIS — R2689 Other abnormalities of gait and mobility: Secondary | ICD-10-CM

## 2021-04-19 DIAGNOSIS — R2681 Unsteadiness on feet: Secondary | ICD-10-CM

## 2021-04-19 DIAGNOSIS — M6281 Muscle weakness (generalized): Secondary | ICD-10-CM

## 2021-04-19 NOTE — Therapy (Signed)
[place] MAIN [facility] SERVICES [place], [place], [address] Phone: [phone]   Fax:  [phone]  Physical Therapy Treatment  Patient Details  Name: Darius Fillingim Rouse MRN: [id number] Date of Birth: 11/25/1960 Referring Provider (PT): Margette Fast MD   Encounter Date: 04/19/2021   PT End of Session - 04/19/21 1006     Visit Number 18    Number of Visits 24    Date for PT Re-Evaluation 05/03/21    Authorization Type 8/10 PN 11/8    PT Start Time 1015    PT Stop Time 1100    PT Time Calculation (min) 45 min    Equipment Utilized During Treatment Gait belt    Activity Tolerance Patient tolerated treatment well    Behavior During Therapy [facility] for tasks assessed/performed             Past Medical History:  Diagnosis Date   Abdominal pain, right upper quadrant    Back pain    Calculus of kidney 12/09/2013   Chronic back pain    unspecified   Chronic left shoulder pain 9/79/8921   Complication of anesthesia    Functional disorder of bladder    other   Galactorrhea 11/26/2014   Chronic    Hereditary and idiopathic neuropathy 08/19/2013   HPV test positive    Hypercholesteremia 08/19/2013   Hypertension    Incomplete bladder emptying    Microscopic hematuria    MS (multiple sclerosis) (HCC)    Muscle spasticity 05/21/2014   Nonspecific findings on examination of urine    other   Osteopenia    PONV (postoperative nausea and vomiting)    Status post laparoscopic supracervical hysterectomy 11/26/2014   Tobacco user 11/26/2014   Wrist fracture     Past Surgical History:  Procedure Laterality Date   bilateral tubal ligation  1996   BREAST CYST EXCISION Left 2002   FRACTURE SURGERY     KNEE SURGERY     right   LAPAROSCOPIC SUPRACERVICAL HYSTERECTOMY  08/05/2013   ORIF WRIST FRACTURE Left 01/17/2017   Procedure: OPEN REDUCTION INTERNAL FIXATION (ORIF) WRIST FRACTURE;  Surgeon: Lovell Sheehan, MD;  Location:  [facility];  Service: Orthopedics;  Laterality: Left;   RADIOLOGY WITH ANESTHESIA N/A 03/18/2020   Procedure: MRI WITH ANESTHESIA CERVICAL SPINE AND BRAIN  WITH AND WITHOUT CONTRAST;  Surgeon: Radiologist, Medication, MD;  Location: [place];  Service: Radiology;  Laterality: N/A;   TUBAL LIGATION Bilateral    VAGINAL HYSTERECTOMY  03/2006    There were no vitals filed for this visit.   Subjective Assessment - 04/19/21 1107     Subjective Patient reports no falls or LOB since last session. Has been compliant with HEP. Reports her wheelchair has started making noise while she is in it    Pertinent History Patient is a pleasant 60 year old female who is returning to PT after MS exacerbation and hospitalization for COVID. Patient went to SNF for one month and then received home health PT for a month (8 visits).  PMH includes HTN, SVT, neurogenic bowel, MS (1995), neuropathy, hx of wrist fx, HPV, Hypercholesteremia, adiposity, osteopenia, spasticity. She wears bilateral AFOs and uses a RW and/or wheelchair for mobility. She drives an adapted car.    Limitations Lifting;Standing;Walking;House hold activities;Sitting    How long can you sit comfortably? n/a    How long can you stand comfortably? 2 minutes    How long can you walk comfortably? [place]  ft in house    Patient Stated Goals to return to PLOF prior to hospitalization    Currently in Pain? No/denies                 Transfers  Lateral scoot from wheelchair to plinth table x1 Sit to stand with bRW and chair pushed against plinth table; 5x; Stand pivot transfer CGA x 1 trial with cues for safety   Seated: Lateral weight shift onto forearm 10x each side Green swiss ball: -TrA contraction 10x 3 second holds -TrA contraction with UE raise 10x each UE -vertical raises 10x -horizontal twists 10x each side Single limb Red Thera-Band hip abduction 15 times each lower extremity Single limb Red Thera-Band hip AB duction 15 times each lower  extremity RTB ER with tactile cueing for scapular control 10x Seated upright posture 2x30 second holds  Wheelchair: -wheelchair initially is making loud noises on L side when patient arrives. L front wheel tightened. Decreased noise after tightening. Patient encouraged to bring to her wheelchair company for further fixing.      Ice pack donned throughout session to minimize exacerbation of MS symptoms in order to perform interventions.      Pt educated throughout session about proper posture and technique with exercises. Improved exercise technique, movement at target joints, use of target muscles after min to mod verbal, visual, tactile cues   Patient tolerated progressive sit to stands from lowering plinth table. Entire session performed without back rest with patient tolerating well. Her core strength is improving with decreased correction for posture required. Slight wheelchair modifications performed with patient encouraged to follow up with her wheelchair company for additional correction. The patient would benefit from continued skilled PT intervention to maximize QOL, safety, and function                    PT Education - 04/19/21 1006     Education provided Yes    Education Details exercise technique, body mechanics    Person(s) Educated Patient    Methods Explanation;Demonstration;Tactile cues;Verbal cues    Comprehension Verbalized understanding;Returned demonstration;Verbal cues required;Tactile cues required              PT Short Term Goals - 03/15/21 1031       PT SHORT TERM GOAL #1   Title Patient will be independent in home exercise program to improve strength/mobility for better functional independence with ADLs.    Baseline 10/4 HEP provided 11/8: HEP compliant    Time 4    Period Weeks    Status Achieved    Target Date 03/08/21               PT Long Term Goals - 03/15/21 0001       PT LONG TERM GOAL #1   Title Patient will  increase FOTO score to equal to or greater than  51/100   to demonstrate statistically significant improvement in mobility and quality of life.    Baseline 10/4: 42% 11/8: 36%    Time 12    Period Weeks    Status On-going    Target Date 05/03/21      PT LONG TERM GOAL #2   Title Patient (< 49 years old) will complete five times sit to stand test in < 20 seconds indicating an increased LE strength and improved balance.    Baseline 10/4: 33.15 seconds with heavy BUE support 11/8: 30 seconds    Time 12    Period Weeks  Status Partially Met    Target Date 05/03/21      PT LONG TERM GOAL #3   Title Patient will increase 10 meter walk test to <20 seconds as to improve gait speed for better community ambulation and to reduce fall risk.    Baseline 10/4: 43.17 seconds with RW 11/8: 40.8 seconds    Time 12    Period Weeks    Status Partially Met    Target Date 05/03/21      PT LONG TERM GOAL #4   Title Patient will reduce timed up and go to <11 seconds to reduce fall risk and demonstrate improved transfer/gait ability.    Baseline 10/4: 52.66 seconds with RW 11/8: 49 seconds with RW    Time 12    Period Weeks    Status Partially Met    Target Date 05/03/21                   Plan - 04/19/21 1108     Clinical Impression Statement Patient tolerated progressive sit to stands from lowering plinth table. Entire session performed without back rest with patient tolerating well. Her core strength is improving with decreased correction for posture required. Slight wheelchair modifications performed with patient encouraged to follow up with her wheelchair company for additional correction. The patient would benefit from continued skilled PT intervention to maximize QOL, safety, and function    Personal Factors and Comorbidities Age;Comorbidity 3+;Finances;Fitness;Past/Current Experience;Sex;Social Background;Time since onset of injury/illness/exacerbation;Transportation    Comorbidities  HTN, SVT, neurogenic bowel, MS (1995), neuropathy, hx of wrist fx, HPV, Hypercholesteremia, adiposity, osteopenia, spasticity.    Examination-Activity Limitations Bathing;Bed Mobility;Bend;Carry;Continence;Dressing;Hygiene/Grooming;Stairs;Squat;Reach Overhead;Locomotion Level;Lift;Stand;Transfers;Toileting    Examination-Participation Restrictions Church;Cleaning;Community Activity;Laundry;Volunteer;Shop;Meal Prep;Yard Work    Merchant [employer] officer Evolving/Moderate complexity    Rehab Potential Good    PT Frequency 2x / week    PT Duration 12 weeks    PT Treatment/Interventions ADLs/Self Care Home Management;Aquatic Therapy;Biofeedback;Cryotherapy;Electrical Stimulation;Iontophoresis 57m/ml Dexamethasone;Traction;Moist Heat;Ultrasound;DME Instruction;Gait training;Stair training;Functional mobility training;Therapeutic activities;Therapeutic exercise;Balance training;Neuromuscular re-education;Patient/family education;Orthotic Fit/Training;Wheelchair mobility training;Manual techniques;Compression bandaging;Passive range of motion;Dry needling;Energy conservation;Taping;Splinting;Vestibular    PT Next Visit Plan in // bars weight shift and LE strengthening    PT Home Exercise Plan HEP given    Consulted and Agree with Plan of Care Patient             Patient will benefit from skilled therapeutic intervention in order to improve the following deficits and impairments:  Abnormal gait, Decreased activity tolerance, Decreased balance, Decreased coordination, Decreased endurance, Decreased mobility, Decreased range of motion, Decreased strength, Difficulty walking, Impaired perceived functional ability, Impaired sensation, Impaired tone, Impaired UE functional use, Improper body mechanics, Postural dysfunction, Cardiopulmonary status limiting activity, Impaired flexibility, Increased muscle spasms  Visit Diagnosis: Muscle weakness (generalized)  Other abnormalities of gait and  mobility  Unsteadiness on feet     Problem List Patient Active Problem List   Diagnosis Date Noted   Acute respiratory disease due to COVID-19 virus 11/21/2020   Weakness    Hypoalbuminemia due to protein-calorie malnutrition (HCC)    Neurogenic bowel    Neurogenic bladder    Labile blood pressure    Neuropathic pain    Abscess of female pelvis    SVT (supraventricular tachycardia) (HCC)    Radial styloid tenosynovitis 03/12/2018   Wheelchair confinement 02/27/2018   Localized osteoporosis with current pathological fracture with routine healing 01/19/2017   Wrist fracture 01/16/2017   Sprain of ankle 03/23/2016   Closed fracture of lateral malleolus  03/16/2016   Health care maintenance 01/24/2016   Blood pressure elevated without history of HTN 10/25/2015   Essential hypertension 10/25/2015   Multiple sclerosis ([place]) 10/02/2015   Chronic left shoulder pain 07/19/2015   Multiple sclerosis exacerbation ([place]) 07/14/2015   MS (multiple sclerosis) ([place]) 11/26/2014   Increased body mass index 11/26/2014   HPV test positive 11/26/2014   Status post laparoscopic supracervical hysterectomy 11/26/2014   Galactorrhea 11/26/2014   Back ache 05/21/2014   Adiposity 05/21/2014   Disordered sleep 05/21/2014   Muscle spasticity 05/21/2014   Spasticity 05/21/2014   Calculus of kidney 49/75/3005   Renal colic 03/09/1116   Hypercholesteremia 08/19/2013   Hereditary and idiopathic neuropathy 08/19/2013   Hypercholesterolemia without hypertriglyceridemia 08/19/2013   Bladder infection, chronic 07/25/2012   Disorder of bladder function 07/25/2012   Incomplete bladder emptying 07/25/2012   Microscopic hematuria 07/25/2012   Right upper quadrant pain 07/25/2012   Janna Arch, PT, DPT  04/19/2021, 11:08 AM  [place], [place], [address] Phone: [phone]   Fax:  [phone]  Name: Symantha Steeber  [place] MRN: [id number] Date of Birth: 1960-09-25

## 2021-04-21 ENCOUNTER — Ambulatory Visit: Payer: PPO

## 2021-04-26 ENCOUNTER — Ambulatory Visit: Payer: PPO

## 2021-04-26 ENCOUNTER — Other Ambulatory Visit: Payer: Self-pay

## 2021-04-26 DIAGNOSIS — M6281 Muscle weakness (generalized): Secondary | ICD-10-CM

## 2021-04-26 DIAGNOSIS — R2689 Other abnormalities of gait and mobility: Secondary | ICD-10-CM

## 2021-04-26 DIAGNOSIS — R2681 Unsteadiness on feet: Secondary | ICD-10-CM

## 2021-04-26 NOTE — Therapy (Signed)
[place] MAIN [facility] SERVICES [address] [place], [place], [address] Phone: [phone]   Fax:  [phone]  Physical Therapy Treatment  Patient Details  Name: Lisa Williams MRN: [id number] Date of Birth: 28-Nov-1960 Referring Provider (PT): Margette Fast MD   Encounter Date: 04/26/2021   PT End of Session - 04/26/21 1140     Visit Number 19    Number of Visits 24    Date for PT Re-Evaluation 05/03/21    Authorization Type 9/10 PN 11/8    PT Start Time 1015    PT Stop Time 1101    PT Time Calculation (min) 46 min    Equipment Utilized During Treatment Gait belt    Activity Tolerance Patient tolerated treatment well    Behavior During Therapy [facility] for tasks assessed/performed             Past Medical History:  Diagnosis Date   Abdominal pain, right upper quadrant    Back pain    Calculus of kidney 12/09/2013   Chronic back pain    unspecified   Chronic left shoulder pain 12/08/2334   Complication of anesthesia    Functional disorder of bladder    other   Galactorrhea 11/26/2014   Chronic    Hereditary and idiopathic neuropathy 08/19/2013   HPV test positive    Hypercholesteremia 08/19/2013   Hypertension    Incomplete bladder emptying    Microscopic hematuria    MS (multiple sclerosis) (HCC)    Muscle spasticity 05/21/2014   Nonspecific findings on examination of urine    other   Osteopenia    PONV (postoperative nausea and vomiting)    Status post laparoscopic supracervical hysterectomy 11/26/2014   Tobacco user 11/26/2014   Wrist fracture     Past Surgical History:  Procedure Laterality Date   bilateral tubal ligation  1996   BREAST CYST EXCISION Left 2002   FRACTURE SURGERY     KNEE SURGERY     right   LAPAROSCOPIC SUPRACERVICAL HYSTERECTOMY  08/05/2013   ORIF WRIST FRACTURE Left 01/17/2017   Procedure: OPEN REDUCTION INTERNAL FIXATION (ORIF) WRIST FRACTURE;  Surgeon: Lovell Sheehan, MD;  Location:  [facility];  Service: Orthopedics;  Laterality: Left;   RADIOLOGY WITH ANESTHESIA N/A 03/18/2020   Procedure: MRI WITH ANESTHESIA CERVICAL SPINE AND BRAIN  WITH AND WITHOUT CONTRAST;  Surgeon: Radiologist, Medication, MD;  Location: [place];  Service: Radiology;  Laterality: N/A;   TUBAL LIGATION Bilateral    VAGINAL HYSTERECTOMY  03/2006    There were no vitals filed for this visit.   Subjective Assessment - 04/26/21 1118     Subjective Patient reports she is feeling good today, wants to attempt ambulation.    Pertinent History Patient is a pleasant 60 year old female who is returning to PT after MS exacerbation and hospitalization for COVID. Patient went to SNF for one month and then received home health PT for a month (8 visits).  PMH includes HTN, SVT, neurogenic bowel, MS (1995), neuropathy, hx of wrist fx, HPV, Hypercholesteremia, adiposity, osteopenia, spasticity. She wears bilateral AFOs and uses a RW and/or wheelchair for mobility. She drives an adapted car.    Limitations Lifting;Standing;Walking;House hold activities;Sitting    How long can you sit comfortably? n/a    How long can you stand comfortably? 2 minutes    How long can you walk comfortably? 40 ft in house    Patient Stated Goals to return to PLOF prior to  hospitalization    Currently in Pain? No/denies                ambulate with RW  ambulate with RW 130 ft with two seated rest breaks with BRW, wheelchair follow, and CGA. Cues for weight shift and foot clearance required bilaterally. Patient has improved LE recruitment with decreased reliance upon Ue's until fatigued.    Seated: with ice pack behind back -RTB abduction 15x each LE -RTB adduction 15x each LE  -GTB row 10x -alternating penguins 10x each side -modified windmills 10x each side -GTB ER 15x  -hamstring isometric pressing through heels 10x 3 second holds, pressing through toes 10x 3 second holds each LE  -forward trunk lean and backwards row with  PT arms 10x  Pt educated throughout session about proper posture and technique with exercises. Improved exercise technique, movement at target joints, use of target muscles after min to mod verbal, visual, tactile cues.     Patient is able to ambulate an increased duration with two seated rest breaks. She is highly motivated throughout session and is eager to progress mobility. Her core activation is increasing with decreased tremors and instability when returning to COM. The patient would benefit from continued skilled PT intervention to maximize QOL, safety, and function                    PT Education - 04/26/21 1139     Education provided Yes    Education Details exercise technique, body mechanics    Person(s) Educated Patient    Methods Explanation;Demonstration;Tactile cues;Verbal cues    Comprehension Verbalized understanding;Returned demonstration;Verbal cues required;Tactile cues required              PT Short Term Goals - 03/15/21 1031       PT SHORT TERM GOAL #1   Title Patient will be independent in home exercise program to improve strength/mobility for better functional independence with ADLs.    Baseline 10/4 HEP provided 11/8: HEP compliant    Time 4    Period Weeks    Status Achieved    Target Date 03/08/21               PT Long Term Goals - 03/15/21 0001       PT LONG TERM GOAL #1   Title Patient will increase FOTO score to equal to or greater than  51/100   to demonstrate statistically significant improvement in mobility and quality of life.    Baseline 10/4: 42% 11/8: 36%    Time 12    Period Weeks    Status On-going    Target Date 05/03/21      PT LONG TERM GOAL #2   Title Patient (< 56 years old) will complete five times sit to stand test in < 20 seconds indicating an increased LE strength and improved balance.    Baseline 10/4: 33.15 seconds with heavy BUE support 11/8: 30 seconds    Time 12    Period Weeks    Status  Partially Met    Target Date 05/03/21      PT LONG TERM GOAL #3   Title Patient will increase 10 meter walk test to <20 seconds as to improve gait speed for better community ambulation and to reduce fall risk.    Baseline 10/4: 43.17 seconds with RW 11/8: 40.8 seconds    Time 12    Period Weeks    Status Partially Met    Target  Date 05/03/21      PT LONG TERM GOAL #4   Title Patient will reduce timed up and go to <11 seconds to reduce fall risk and demonstrate improved transfer/gait ability.    Baseline 10/4: 52.66 seconds with RW 11/8: 49 seconds with RW    Time 12    Period Weeks    Status Partially Met    Target Date 05/03/21                   Plan - 04/26/21 1143     Clinical Impression Statement Patient is able to ambulate an increased duration with two seated rest breaks. She is highly motivated throughout session and is eager to progress mobility. Her core activation is increasing with decreased tremors and instability when returning to COM. The patient would benefit from continued skilled PT intervention to maximize QOL, safety, and function    Personal Factors and Comorbidities Age;Comorbidity 3+;Finances;Fitness;Past/Current Experience;Sex;Social Background;Time since onset of injury/illness/exacerbation;Transportation    Comorbidities HTN, SVT, neurogenic bowel, MS (1995), neuropathy, hx of wrist fx, HPV, Hypercholesteremia, adiposity, osteopenia, spasticity.    Examination-Activity Limitations Bathing;Bed Mobility;Bend;Carry;Continence;Dressing;Hygiene/Grooming;Stairs;Squat;Reach Overhead;Locomotion Level;Lift;Stand;Transfers;Toileting    Examination-Participation Restrictions Church;Cleaning;Community Activity;Laundry;Volunteer;Shop;Meal Prep;Yard Work    [employer]chant [employer] officer Evolving/Moderate complexity    Rehab Potential Good    PT Frequency 2x / week    PT Duration 12 weeks    PT Treatment/Interventions ADLs/Self Care Home Management;Aquatic  Therapy;Biofeedback;Cryotherapy;Electrical Stimulation;Iontophoresis 78m/ml Dexamethasone;Traction;Moist Heat;Ultrasound;DME Instruction;Gait training;Stair training;Functional mobility training;Therapeutic activities;Therapeutic exercise;Balance training;Neuromuscular re-education;Patient/family education;Orthotic Fit/Training;Wheelchair mobility training;Manual techniques;Compression bandaging;Passive range of motion;Dry needling;Energy conservation;Taping;Splinting;Vestibular    PT Next Visit Plan in // bars weight shift and LE strengthening    PT Home Exercise Plan HEP given    Consulted and Agree with Plan of Care Patient             Patient will benefit from skilled therapeutic intervention in order to improve the following deficits and impairments:  Abnormal gait, Decreased activity tolerance, Decreased balance, Decreased coordination, Decreased endurance, Decreased mobility, Decreased range of motion, Decreased strength, Difficulty walking, Impaired perceived functional ability, Impaired sensation, Impaired tone, Impaired UE functional use, Improper body mechanics, Postural dysfunction, Cardiopulmonary status limiting activity, Impaired flexibility, Increased muscle spasms  Visit Diagnosis: Muscle weakness (generalized)  Other abnormalities of gait and mobility  Unsteadiness on feet     Problem List Patient Active Problem List   Diagnosis Date Noted   Acute respiratory disease due to COVID-19 virus 11/21/2020   Weakness    Hypoalbuminemia due to protein-calorie malnutrition (HCC)    Neurogenic bowel    Neurogenic bladder    Labile blood pressure    Neuropathic pain    Abscess of female pelvis    SVT (supraventricular tachycardia) (HCC)    Radial styloid tenosynovitis 03/12/2018   Wheelchair confinement 02/27/2018   Localized osteoporosis with current pathological fracture with routine healing 01/19/2017   Wrist fracture 01/16/2017   Sprain of ankle 03/23/2016   Closed  fracture of lateral malleolus 03/16/2016   Health care maintenance 01/24/2016   Blood pressure elevated without history of HTN 10/25/2015   Essential hypertension 10/25/2015   Multiple sclerosis ([place] 10/02/2015   Chronic left shoulder pain 07/19/2015   [place] ([place] 07/14/2015   [place] sclerosis) ([place] 11/26/2014   Increased body mass index 11/26/2014   HPV test positive 11/26/2014   Status post laparoscopic supracervical hysterectomy 11/26/2014   Galactorrhea 11/26/2014   Back ache 05/21/2014   Adiposity 05/21/2014   Disordered sleep  05/21/2014   Muscle spasticity 05/21/2014   Spasticity 05/21/2014   Calculus of kidney 65/79/0383   Renal colic 33/83/2919   Hypercholesteremia 08/19/2013   Hereditary and idiopathic neuropathy 08/19/2013   Hypercholesterolemia without hypertriglyceridemia 08/19/2013   Bladder infection, chronic 07/25/2012   Disorder of bladder function 07/25/2012   Incomplete bladder emptying 07/25/2012   Microscopic hematuria 07/25/2012   Right upper quadrant pain 07/25/2012   Janna Arch, PT, DPT  04/26/2021, 11:44 AM  [place] MAIN [facility] SERVICES [address] [place], [place], [address] Phone: [phone]   Fax:  [phone]  Name: Lisa Williams MRN: [id number] Date of Birth: March 20, 1961

## 2021-04-28 ENCOUNTER — Ambulatory Visit: Payer: PPO

## 2021-05-03 ENCOUNTER — Ambulatory Visit: Payer: PPO

## 2021-05-05 ENCOUNTER — Other Ambulatory Visit: Payer: Self-pay

## 2021-05-05 ENCOUNTER — Other Ambulatory Visit: Payer: Self-pay | Admitting: Neurology

## 2021-05-05 ENCOUNTER — Encounter: Payer: Self-pay | Admitting: Neurology

## 2021-05-05 ENCOUNTER — Ambulatory Visit: Payer: PPO

## 2021-05-05 DIAGNOSIS — M6281 Muscle weakness (generalized): Secondary | ICD-10-CM | POA: Diagnosis not present

## 2021-05-05 DIAGNOSIS — M62838 Other muscle spasm: Secondary | ICD-10-CM

## 2021-05-05 DIAGNOSIS — R2689 Other abnormalities of gait and mobility: Secondary | ICD-10-CM

## 2021-05-05 DIAGNOSIS — G35 Multiple sclerosis: Secondary | ICD-10-CM

## 2021-05-05 DIAGNOSIS — R278 Other lack of coordination: Secondary | ICD-10-CM

## 2021-05-05 DIAGNOSIS — Z5181 Encounter for therapeutic drug level monitoring: Secondary | ICD-10-CM

## 2021-05-05 DIAGNOSIS — R2681 Unsteadiness on feet: Secondary | ICD-10-CM

## 2021-05-05 NOTE — Therapy (Signed)
[place] MAIN [facility] SERVICES [address] [place], [place], [address] Phone: [phone]   Fax:  [phone]  Physical Therapy Treatment/RECERT/Physical Therapy Progress Note VISIT 20   Dates of reporting period  03/15/2021   to   05/05/2021   Patient Details  Name: Lisa Williams MRN: [id number] Date of Birth: [birth year] Referring Provider (PT): Margette Fast MD   Encounter Date: 05/05/2021   PT End of Session - 05/05/21 1246     Visit Number 20    Number of Visits 44    Date for PT Re-Evaluation 07/28/21    Authorization Type 9/10 PN 11/8    PT Start Time 1012    PT Stop Time 1100    PT Time Calculation (min) 48 min    Equipment Utilized During Treatment Gait belt    Activity Tolerance Patient tolerated treatment well    Behavior During Therapy [facility] for tasks assessed/performed             Past Medical History:  Diagnosis Date   Abdominal pain, right upper quadrant    Back pain    Calculus of kidney 12/09/2013   Chronic back pain    unspecified   Chronic left shoulder pain 2/77/4128   Complication of anesthesia    Functional disorder of bladder    other   Galactorrhea 11/26/2014   Chronic    Hereditary and idiopathic neuropathy 08/19/2013   HPV test positive    Hypercholesteremia 08/19/2013   Hypertension    Incomplete bladder emptying    Microscopic hematuria    MS (multiple sclerosis) (HCC)    Muscle spasticity 05/21/2014   Nonspecific findings on examination of urine    other   Osteopenia    PONV (postoperative nausea and vomiting)    Status post laparoscopic supracervical hysterectomy 11/26/2014   Tobacco user 11/26/2014   Wrist fracture     Past Surgical History:  Procedure Laterality Date   bilateral tubal ligation  1996   BREAST CYST EXCISION Left 2002   FRACTURE SURGERY     KNEE SURGERY     right   LAPAROSCOPIC SUPRACERVICAL HYSTERECTOMY  08/05/2013   ORIF WRIST FRACTURE Left 01/17/2017    Procedure: OPEN REDUCTION INTERNAL FIXATION (ORIF) WRIST FRACTURE;  Surgeon: Lovell Sheehan, MD;  Location: [facility];  Service: Orthopedics;  Laterality: Left;   RADIOLOGY WITH ANESTHESIA N/A 03/18/2020   Procedure: MRI WITH ANESTHESIA CERVICAL SPINE AND BRAIN  WITH AND WITHOUT CONTRAST;  Surgeon: Radiologist, Medication, MD;  Location: [place];  Service: Radiology;  Laterality: N/A;   TUBAL LIGATION Bilateral    VAGINAL HYSTERECTOMY  03/2006    There were no vitals filed for this visit.   Subjective Assessment - 05/05/21 1319     Subjective Pt reports some R side LBP today. Pt describes it as a muscle spasm. No other major changes since last appointment reported.    Patient is accompained by: Family member    Pertinent History Patient is a pleasant 60 year old female who is returning to PT after MS exacerbation and hospitalization for COVID. Patient went to SNF for one month and then received home health PT for a month (8 visits).  PMH includes HTN, SVT, neurogenic bowel, MS (1995), neuropathy, hx of wrist fx, HPV, Hypercholesteremia, adiposity, osteopenia, spasticity. She wears bilateral AFOs and uses a RW and/or wheelchair for mobility. She drives an adapted car.    Limitations Lifting;Standing;Walking;House hold activities;Sitting    How long can  you sit comfortably? n/a    How long can you stand comfortably? 2 minutes    How long can you walk comfortably? 40 ft in house    Patient Stated Goals to return to PLOF prior to hospitalization    Currently in Pain? Yes    Pain Location Back    Pain Orientation Right;Lower    Pain Descriptors / Indicators Spasm    Pain Onset In the past 7 days              INTERVENTIONS - goals reassessed for recert/prog note (see goal section for details)  FOTO: 45   10MWT: 0.23 m/s with RW  TUG: 44.3 sec pt reports R side muscle spasms Rest interval with ice to low back on R side  5xSTS: pt able to complete 2 reps but then discontinues due to  increase in low back pain. Will retry at future session.   PT discusses with pt indications of goal reassessment/progress in PT.  Seated p.ball roll outs forward/backward and side-to-side x multiple reps.  --progressed to with cueing for TrA contraction/sustained activation. Pt reports pain relief with intervention.  PT provides manual therapy x 3 min to R side SIJ grade I-II PA, pt seated in forward flexion. Pt reports improvement in symptoms.    Seated: with ice pack behind back -RTB adduction 2x10 each LE; discontinued due to increase in LBP -GTB row 10x in neutral position -GTB row 10x performed with increased shoulder abduction -Windmills x multiple reps each side -GTB ER 2x10   Pt educated throughout session about proper posture and technique with exercises. Improved exercise technique, movement at target joints, use of target muscles after min to mod verbal, visual, tactile cues.      PT Education - 05/05/21 1247     Education provided Yes    Education Details Reassessment of goals, exercise technique, body mechanics    Person(s) Educated Patient    Methods Explanation;Demonstration;Verbal cues    Comprehension Verbalized understanding;Returned demonstration;Need further instruction              PT Short Term Goals - 05/05/21 1253       PT SHORT TERM GOAL #1   Title Patient will be independent in home exercise program to improve strength/mobility for better functional independence with ADLs.    Baseline 10/4 HEP provided 11/8: HEP compliant    Time 4    Period Weeks    Status Achieved    Target Date 03/08/21               PT Long Term Goals - 05/05/21 0001       PT LONG TERM GOAL #1   Title Patient will increase FOTO score to equal to or greater than  51/100   to demonstrate statistically significant improvement in mobility and quality of life.    Baseline 10/4: 42% 11/8: 36% 12/29: 45%    Time 12    Period Weeks    Status On-going    Target Date  07/28/21      PT LONG TERM GOAL #2   Title Patient (< 39 years old) will complete five times sit to stand test in < 20 seconds indicating an increased LE strength and improved balance.    Baseline 10/4: 33.15 seconds with heavy BUE support 11/8: 30 seconds; 12/29: deferred to future session. Pt attempted today but unable to complete d/t increase in R side LBP    Time 12    Period Weeks  Status Partially Met    Target Date 07/28/21      PT LONG TERM GOAL #3   Title Patient will increase 10 meter walk test to <20 seconds as to improve gait speed for better community ambulation and to reduce fall risk.    Baseline 10/4: 43.17 seconds with RW 11/8: 40.8 seconds; 43 sec (0.23 m/s) with RW    Time 12    Period Weeks    Status Partially Met    Target Date 07/28/21      PT LONG TERM GOAL #4   Title Patient will reduce timed up and go to <11 seconds to reduce fall risk and demonstrate improved transfer/gait ability.    Baseline 10/4: 52.66 seconds with RW 11/8: 49 seconds with RW; 12/29: 44.3 sec    Time 12    Period Weeks    Status Partially Met    Target Date 07/28/21                   Plan - 05/05/21 1322     Clinical Impression Statement Pt presents with excellent motivation to participate in session. Goals were reassessed for recert and progress note. Pt making gains AEB increase in FOTO score (45%) and improved time on TUG (44.3 sec), indicating improved functional mobility and a decrease in fall risk. While pt is making gains, pt did see slight decrease in 10MWT (43 sec, or 0.23 m/s). Pt 10MWT performance likely impacted by increase in pt's low back pain with activity. Due to R side low back pain, pt was unable to complete 5xSTS; test deferred to future session.  Patient's condition has the potential to improve in response to therapy. Maximum improvement is yet to be obtained. The anticipated improvement is attainable and reasonable in a generally predictable time. The pt will  benefit from further skilled PT to improve mobility, strength, pain, gait and balance in order to decrease fall risk and increase QOL.    Personal Factors and Comorbidities Age;Comorbidity 3+;Finances;Fitness;Past/Current Experience;Sex;Social Background;Time since onset of injury/illness/exacerbation;Transportation    Comorbidities HTN, SVT, neurogenic bowel, MS (1995), neuropathy, hx of wrist fx, HPV, Hypercholesteremia, adiposity, osteopenia, spasticity.    Examination-Activity Limitations Bathing;Bed Mobility;Bend;Carry;Continence;Dressing;Hygiene/Grooming;Stairs;Squat;Reach Overhead;Locomotion Level;Lift;Stand;Transfers;Toileting    Examination-Participation Restrictions Church;Cleaning;Community Activity;Laundry;Volunteer;Shop;Meal Prep;Yard Work    Merchant [employer] officer Evolving/Moderate complexity    Rehab Potential Good    PT Frequency 2x / week    PT Duration 12 weeks    PT Treatment/Interventions ADLs/Self Care Home Management;Aquatic Therapy;Biofeedback;Cryotherapy;Electrical Stimulation;Iontophoresis 52m/ml Dexamethasone;Traction;Moist Heat;Ultrasound;DME Instruction;Gait training;Stair training;Functional mobility training;Therapeutic activities;Therapeutic exercise;Balance training;Neuromuscular re-education;Patient/family education;Orthotic Fit/Training;Wheelchair mobility training;Manual techniques;Compression bandaging;Passive range of motion;Dry needling;Energy conservation;Taping;Splinting;Vestibular    PT Next Visit Plan in // bars weight shift and LE strengthening    PT Home Exercise Plan HEP given    Consulted and Agree with Plan of Care Patient             Patient will benefit from skilled therapeutic intervention in order to improve the following deficits and impairments:  Abnormal gait, Decreased activity tolerance, Decreased balance, Decreased coordination, Decreased endurance, Decreased mobility, Decreased range of motion, Decreased strength, Difficulty  walking, Impaired perceived functional ability, Impaired sensation, Impaired tone, Impaired UE functional use, Improper body mechanics, Postural dysfunction, Cardiopulmonary status limiting activity, Impaired flexibility, Increased muscle spasms  Visit Diagnosis: Other abnormalities of gait and mobility  Muscle weakness (generalized)  Unsteadiness on feet  Other lack of coordination     Problem List Patient Active Problem List   Diagnosis Date Noted  Acute respiratory disease due to COVID-19 virus 11/21/2020   Weakness    Hypoalbuminemia due to protein-calorie malnutrition (HCC)    Neurogenic bowel    Neurogenic bladder    Labile blood pressure    Neuropathic pain    Abscess of female pelvis    SVT (supraventricular tachycardia) (HCC)    Radial styloid tenosynovitis 03/12/2018   Wheelchair confinement 02/27/2018   Localized osteoporosis with current pathological fracture with routine healing 01/19/2017   Wrist fracture 01/16/2017   Sprain of ankle 03/23/2016   Closed fracture of lateral malleolus 03/16/2016   Health care maintenance 01/24/2016   Blood pressure elevated without history of HTN 10/25/2015   Essential hypertension 10/25/2015   Multiple sclerosis ([place]) 10/02/2015   Chronic left shoulder pain 07/19/2015   Multiple sclerosis exacerbation ([place]) 07/14/2015   MS (multiple sclerosis) ([place]) 11/26/2014   Increased body mass index 11/26/2014   HPV test positive 11/26/2014   Status post laparoscopic supracervical hysterectomy 11/26/2014   Galactorrhea 11/26/2014   Back ache 05/21/2014   Adiposity 05/21/2014   Disordered sleep 05/21/2014   Muscle spasticity 05/21/2014   Spasticity 05/21/2014   Calculus of kidney 21/74/7159   Renal colic 53/96/7289   Hypercholesteremia 08/19/2013   Hereditary and idiopathic neuropathy 08/19/2013   Hypercholesterolemia without hypertriglyceridemia 08/19/2013   Bladder infection, chronic 07/25/2012   Disorder of bladder function  07/25/2012   Incomplete bladder emptying 07/25/2012   Microscopic hematuria 07/25/2012   Right upper quadrant pain 07/25/2012    Zollie Pee, PT 05/05/2021, 3:59 PM  [place] MAIN [facility] SERVICES [address] [place], [place], [address] Phone: [phone]   Fax:  [phone]  Name: Lisa Williams MRN: [id number] Date of Birth: [birth year]

## 2021-05-06 ENCOUNTER — Ambulatory Visit
Admission: RE | Admit: 2021-05-06 | Discharge: 2021-05-06 | Disposition: A | Discharge status: Home / Self Care | Payer: PPO | Source: Ambulatory Visit | Attending: Internal Medicine | Admitting: Internal Medicine

## 2021-05-06 DIAGNOSIS — Z1231 Encounter for screening mammogram for malignant neoplasm of breast: Secondary | ICD-10-CM | POA: Diagnosis not present

## 2021-05-10 ENCOUNTER — Ambulatory Visit: Payer: PPO | Attending: Neurology

## 2021-05-10 ENCOUNTER — Other Ambulatory Visit: Payer: Self-pay

## 2021-05-10 DIAGNOSIS — G35 Multiple sclerosis: Secondary | ICD-10-CM | POA: Insufficient documentation

## 2021-05-10 DIAGNOSIS — R2681 Unsteadiness on feet: Secondary | ICD-10-CM | POA: Insufficient documentation

## 2021-05-10 DIAGNOSIS — M6281 Muscle weakness (generalized): Secondary | ICD-10-CM | POA: Insufficient documentation

## 2021-05-10 DIAGNOSIS — R2689 Other abnormalities of gait and mobility: Secondary | ICD-10-CM | POA: Diagnosis not present

## 2021-05-10 DIAGNOSIS — R262 Difficulty in walking, not elsewhere classified: Secondary | ICD-10-CM | POA: Insufficient documentation

## 2021-05-10 DIAGNOSIS — R278 Other lack of coordination: Secondary | ICD-10-CM | POA: Insufficient documentation

## 2021-05-10 NOTE — Therapy (Signed)
[place] MAIN [facility] SERVICES [place], [place], [address] Phone: [phone]   Fax:  [phone]  Physical Therapy Treatment  Patient Details  Name: Lisa Williams MRN: [id number] Date of Birth: 06-11-1960 Referring Provider (PT): Margette Fast MD   Encounter Date: 05/10/2021   PT End of Session - 05/10/21 1008     Visit Number 21    Number of Visits 44    Date for PT Re-Evaluation 07/28/21    Authorization Type 9/10 PN 11/8    PT Start Time 1014    PT Stop Time 1058    PT Time Calculation (min) 44 min    Equipment Utilized During Treatment Gait belt    Activity Tolerance Patient tolerated treatment well    Behavior During Therapy [facility] for tasks assessed/performed             Past Medical History:  Diagnosis Date   Abdominal pain, right upper quadrant    Back pain    Calculus of kidney 12/09/2013   Chronic back pain    unspecified   Chronic left shoulder pain 5/95/6387   Complication of anesthesia    Functional disorder of bladder    other   Galactorrhea 11/26/2014   Chronic    Hereditary and idiopathic neuropathy 08/19/2013   HPV test positive    Hypercholesteremia 08/19/2013   Hypertension    Incomplete bladder emptying    Microscopic hematuria    MS (multiple sclerosis) (HCC)    Muscle spasticity 05/21/2014   Nonspecific findings on examination of urine    other   Osteopenia    PONV (postoperative nausea and vomiting)    Status post laparoscopic supracervical hysterectomy 11/26/2014   Tobacco user 11/26/2014   Wrist fracture     Past Surgical History:  Procedure Laterality Date   bilateral tubal ligation  1996   BREAST CYST EXCISION Left 2002   FRACTURE SURGERY     KNEE SURGERY     right   LAPAROSCOPIC SUPRACERVICAL HYSTERECTOMY  08/05/2013   ORIF WRIST FRACTURE Left 01/17/2017   Procedure: OPEN REDUCTION INTERNAL FIXATION (ORIF) WRIST FRACTURE;  Surgeon: Lovell Sheehan, MD;  Location: [facility]  ORS;  Service: Orthopedics;  Laterality: Left;   RADIOLOGY WITH ANESTHESIA N/A 03/18/2020   Procedure: MRI WITH ANESTHESIA CERVICAL SPINE AND BRAIN  WITH AND WITHOUT CONTRAST;  Surgeon: Radiologist, Medication, MD;  Location: [place];  Service: Radiology;  Laterality: N/A;   TUBAL LIGATION Bilateral    VAGINAL HYSTERECTOMY  03/2006    There were no vitals filed for this visit.   Subjective Assessment - 05/10/21 1459     Subjective Patient reports her back pain has improved but still having spasms. Has been compliant with her HEP    Patient is accompained by: Family member    Pertinent History Patient is a pleasant 61 year old female who is returning to PT after MS exacerbation and hospitalization for COVID. Patient went to SNF for one month and then received home health PT for a month (8 visits).  PMH includes HTN, SVT, neurogenic bowel, MS (1995), neuropathy, hx of wrist fx, HPV, Hypercholesteremia, adiposity, osteopenia, spasticity. She wears bilateral AFOs and uses a RW and/or wheelchair for mobility. She drives an adapted car.    Limitations Lifting;Standing;Walking;House hold activities;Sitting    How long can you sit comfortably? n/a    How long can you stand comfortably? 2 minutes    How long can you walk comfortably? [place]  ft in house    Patient Stated Goals to return to PLOF prior to hospitalization    Currently in Pain? Yes    Pain Score 3     Pain Location Back    Pain Orientation Lower;Right    Pain Descriptors / Indicators Spasm    Pain Type Acute pain    Pain Onset In the past 7 days    Pain Frequency Intermittent                  ambulate with RW  ambulate with RW 90 ft with one seated rest breaks with BRW, wheelchair follow, and CGA. Cues for weight shift and foot clearance required bilaterally. Patient has improved LE recruitment with decreased reliance upon Ue's until fatigued.     Seated: with ice pack behind back  -GTB row 10x -sit to stand from chair 5x;  with two rest breaks  STM with implementation of effleurage and ptrissage to R low back for pain/spasms reduction x 9 minutes    Pt educated throughout session about proper posture and technique with exercises. Improved exercise technique, movement at target joints, use of target muscles after min to mod verbal, visual, tactile cues.     Patient continues to work on being able to perform consecutive sit to stands, requiring rest breaks between five. She is highly motivated throughout session despite fatigue and spasms. Spasms are reduced with manual with patient reporting relief of symptoms by end of session. The patient would benefit from continued skilled PT intervention to maximize QOL, safety, and function                   PT Education - 05/10/21 1008     Education provided Yes    Education Details exercise technique, body mechanics    Person(s) Educated Patient    Methods Explanation;Demonstration;Tactile cues;Verbal cues    Comprehension Verbalized understanding;Returned demonstration;Verbal cues required;Tactile cues required              PT Short Term Goals - 05/05/21 1253       PT SHORT TERM GOAL #1   Title Patient will be independent in home exercise program to improve strength/mobility for better functional independence with ADLs.    Baseline 10/4 HEP provided 11/8: HEP compliant    Time 4    Period Weeks    Status Achieved    Target Date 03/08/21               PT Long Term Goals - 05/05/21 0001       PT LONG TERM GOAL #1   Title Patient will increase FOTO score to equal to or greater than  51/100   to demonstrate statistically significant improvement in mobility and quality of life.    Baseline 10/4: 42% 11/8: 36% 12/29: 45%    Time 12    Period Weeks    Status On-going    Target Date 07/28/21      PT LONG TERM GOAL #2   Title Patient (< 56 years old) will complete five times sit to stand test in < 20 seconds indicating an increased  LE strength and improved balance.    Baseline 10/4: 33.15 seconds with heavy BUE support 11/8: 30 seconds; 12/29: deferred to future session. Pt attempted today but unable to complete d/t increase in R side LBP    Time 12    Period Weeks    Status Partially Met    Target Date 07/28/21  PT LONG TERM GOAL #3   Title Patient will increase 10 meter walk test to <20 seconds as to improve gait speed for better community ambulation and to reduce fall risk.    Baseline 10/4: 43.17 seconds with RW 11/8: 40.8 seconds; 43 sec (0.23 m/s) with RW    Time 12    Period Weeks    Status Partially Met    Target Date 07/28/21      PT LONG TERM GOAL #4   Title Patient will reduce timed up and go to <11 seconds to reduce fall risk and demonstrate improved transfer/gait ability.    Baseline 10/4: 52.66 seconds with RW 11/8: 49 seconds with RW; 12/29: 44.3 sec    Time 12    Period Weeks    Status Partially Met    Target Date 07/28/21                   Plan - 05/10/21 1506     Clinical Impression Statement Patient continues to work on being able to perform consecutive sit to stands, requiring rest breaks between five. She is highly motivated throughout session despite fatigue and spasms. Spasms are reduced with manual with patient reporting relief of symptoms by end of session. The patient would benefit from continued skilled PT intervention to maximize QOL, safety, and function    Personal Factors and Comorbidities Age;Comorbidity 3+;Finances;Fitness;Past/Current Experience;Sex;Social Background;Time since onset of injury/illness/exacerbation;Transportation    Comorbidities HTN, SVT, neurogenic bowel, MS (1995), neuropathy, hx of wrist fx, HPV, Hypercholesteremia, adiposity, osteopenia, spasticity.    Examination-Activity Limitations Bathing;Bed Mobility;Bend;Carry;Continence;Dressing;Hygiene/Grooming;Stairs;Squat;Reach Overhead;Locomotion Level;Lift;Stand;Transfers;Toileting     Examination-Participation Restrictions Church;Cleaning;Community Activity;Laundry;Volunteer;Shop;Meal Prep;Yard Work    [employer]chant [employer] officer Evolving/Moderate complexity    Rehab Potential Good    PT Frequency 2x / week    PT Duration 12 weeks    PT Treatment/Interventions ADLs/Self Care Home Management;Aquatic Therapy;Biofeedback;Cryotherapy;Electrical Stimulation;Iontophoresis 38m/ml Dexamethasone;Traction;Moist Heat;Ultrasound;DME Instruction;Gait training;Stair training;Functional mobility training;Therapeutic activities;Therapeutic exercise;Balance training;Neuromuscular re-education;Patient/family education;Orthotic Fit/Training;Wheelchair mobility training;Manual techniques;Compression bandaging;Passive range of motion;Dry needling;Energy conservation;Taping;Splinting;Vestibular    PT Next Visit Plan in // bars weight shift and LE strengthening    PT Home Exercise Plan HEP given    Consulted and Agree with Plan of Care Patient             Patient will benefit from skilled therapeutic intervention in order to improve the following deficits and impairments:  Abnormal gait, Decreased activity tolerance, Decreased balance, Decreased coordination, Decreased endurance, Decreased mobility, Decreased range of motion, Decreased strength, Difficulty walking, Impaired perceived functional ability, Impaired sensation, Impaired tone, Impaired UE functional use, Improper body mechanics, Postural dysfunction, Cardiopulmonary status limiting activity, Impaired flexibility, Increased muscle spasms  Visit Diagnosis: Other abnormalities of gait and mobility  Muscle weakness (generalized)  Unsteadiness on feet     Problem List Patient Active Problem List   Diagnosis Date Noted   Acute respiratory disease due to COVID-19 virus 11/21/2020   Weakness    Hypoalbuminemia due to protein-calorie malnutrition (HCC)    Neurogenic bowel    Neurogenic bladder    Labile blood pressure     Neuropathic pain    Abscess of female pelvis    SVT (supraventricular tachycardia) (HCC)    Radial styloid tenosynovitis 03/12/2018   Wheelchair confinement 02/27/2018   Localized osteoporosis with current pathological fracture with routine healing 01/19/2017   Wrist fracture 01/16/2017   Sprain of ankle 03/23/2016   Closed fracture of lateral malleolus 03/16/2016   Health care maintenance 01/24/2016   Blood  pressure elevated without history of HTN 10/25/2015   Essential hypertension 10/25/2015   Multiple sclerosis ([place]) 10/02/2015   Chronic left shoulder pain 07/19/2015   Multiple sclerosis exacerbation ([place]) 07/14/2015   MS (multiple sclerosis) ([place]) 11/26/2014   Increased body mass index 11/26/2014   HPV test positive 11/26/2014   Status post laparoscopic supracervical hysterectomy 11/26/2014   Galactorrhea 11/26/2014   Back ache 05/21/2014   Adiposity 05/21/2014   Disordered sleep 05/21/2014   Muscle spasticity 05/21/2014   Spasticity 05/21/2014   Calculus of kidney 03/88/8280   Renal colic 03/49/1791   Hypercholesteremia 08/19/2013   Hereditary and idiopathic neuropathy 08/19/2013   Hypercholesterolemia without hypertriglyceridemia 08/19/2013   Bladder infection, chronic 07/25/2012   Disorder of bladder function 07/25/2012   Incomplete bladder emptying 07/25/2012   Microscopic hematuria 07/25/2012   Right upper quadrant pain 07/25/2012   Janna Arch, PT, DPT  05/10/2021, 3:08 PM  [place] MAIN [facility] SERVICES [address] [place], [place], [address] Phone: [phone]   Fax:  [phone]  Name: Lisa Williams MRN: [id number] Date of Birth: 10-02-1960

## 2021-05-10 NOTE — Addendum Note (Signed)
Addended by: Baird Kay on: 05/10/2021 05:19 PM   Modules accepted: Orders

## 2021-05-10 NOTE — Telephone Encounter (Signed)
Faxed failed that was sent 05/05/21. Re-faxed to [phone]. Received fax confirmation.

## 2021-05-12 ENCOUNTER — Ambulatory Visit: Payer: PPO

## 2021-05-12 ENCOUNTER — Other Ambulatory Visit: Payer: Self-pay

## 2021-05-12 DIAGNOSIS — R2681 Unsteadiness on feet: Secondary | ICD-10-CM

## 2021-05-12 DIAGNOSIS — R2689 Other abnormalities of gait and mobility: Secondary | ICD-10-CM

## 2021-05-12 DIAGNOSIS — M6281 Muscle weakness (generalized): Secondary | ICD-10-CM

## 2021-05-12 NOTE — Therapy (Signed)
[place] MAIN [facility] SERVICES [address] [place], [place], [address] Phone: [phone]   Fax:  [phone]  Physical Therapy Treatment  Patient Details  Name: Lisa Williams MRN: [id number] Date of Birth: 1960-06-23 Referring Provider (PT): Margette Fast MD   Encounter Date: 05/12/2021   PT End of Session - 05/12/21 1007     Visit Number 22    Number of Visits 44    Date for PT Re-Evaluation 07/28/21    Authorization Type 2/10 with PN on 12/29    PT Start Time 1014    PT Stop Time 1058    PT Time Calculation (min) 44 min    Equipment Utilized During Treatment Gait belt    Activity Tolerance Patient tolerated treatment well    Behavior During Therapy [facility] for tasks assessed/performed             Past Medical History:  Diagnosis Date   Abdominal pain, right upper quadrant    Back pain    Calculus of kidney 12/09/2013   Chronic back pain    unspecified   Chronic left shoulder pain 09/09/6977   Complication of anesthesia    Functional disorder of bladder    other   Galactorrhea 11/26/2014   Chronic    Hereditary and idiopathic neuropathy 08/19/2013   HPV test positive    Hypercholesteremia 08/19/2013   Hypertension    Incomplete bladder emptying    Microscopic hematuria    MS (multiple sclerosis) (HCC)    Muscle spasticity 05/21/2014   Nonspecific findings on examination of urine    other   Osteopenia    PONV (postoperative nausea and vomiting)    Status post laparoscopic supracervical hysterectomy 11/26/2014   Tobacco user 11/26/2014   Wrist fracture     Past Surgical History:  Procedure Laterality Date   bilateral tubal ligation  1996   BREAST CYST EXCISION Left 2002   FRACTURE SURGERY     KNEE SURGERY     right   LAPAROSCOPIC SUPRACERVICAL HYSTERECTOMY  08/05/2013   ORIF WRIST FRACTURE Left 01/17/2017   Procedure: OPEN REDUCTION INTERNAL FIXATION (ORIF) WRIST FRACTURE;  Surgeon: Lovell Sheehan, MD;   Location: [facility] ORS;  Service: Orthopedics;  Laterality: Left;   RADIOLOGY WITH ANESTHESIA N/A 03/18/2020   Procedure: MRI WITH ANESTHESIA CERVICAL SPINE AND BRAIN  WITH AND WITHOUT CONTRAST;  Surgeon: Radiologist, Medication, MD;  Location: [place];  Service: Radiology;  Laterality: N/A;   TUBAL LIGATION Bilateral    VAGINAL HYSTERECTOMY  03/2006    There were no vitals filed for this visit.   Subjective Assessment - 05/12/21 1256     Subjective Patient has compliance with HEP, no falls or LOB since last session.    Patient is accompained by: Family member    Pertinent History Patient is a pleasant 61 year old female who is returning to PT after MS exacerbation and hospitalization for COVID. Patient went to SNF for one month and then received home health PT for a month (8 visits).  PMH includes HTN, SVT, neurogenic bowel, MS (1995), neuropathy, hx of wrist fx, HPV, Hypercholesteremia, adiposity, osteopenia, spasticity. She wears bilateral AFOs and uses a RW and/or wheelchair for mobility. She drives an adapted car.    Limitations Lifting;Standing;Walking;House hold activities;Sitting    How long can you sit comfortably? n/a    How long can you stand comfortably? 2 minutes    How long can you walk comfortably? 40 ft in house  Patient Stated Goals to return to PLOF prior to hospitalization    Currently in Pain? No/denies                      Standing in // bars: -standing weight shift 10x each direction -PVC pipe: chest press 10x, UE raise 10x -PVC pipe witches brew circle 8x; very challenging for patient Reach down and pick up cone from 6" step on the side cone pick up and pass to PT: 12" and 8" x2 trials  Seated: Hamstring stretch 30 seconds each LE x 2 sets Lateral weight shifts 10x each side   forward trunk lean to trunk extension 10x  Scapular adduction with row 10x    3lb dumbells: Alternating punches 10x each UE Lateral raise 10x each UE cue for middle delt  activation Front raise for anterior delt 10x each UE  Ice pack donned throughout session to minimize exacerbation of MS symptoms in order to perform interventions.      Pt educated throughout session about proper posture and technique with exercises. Improved exercise technique, movement at target joints, use of target muscles after min to mod verbal, visual, tactile cues     Patient tolerated standing and seated interventions well. Multiple sit to stands performed during the sessions with patient utilizing LE musculature indicating carryover from previous sessions. Standing stability is improving as patient is returning to tasks pre-hospitalization. The patient would benefit from continued skilled PT intervention to maximize QOL, safety, and function                   PT Education - 05/12/21 1006     Education provided Yes    Education Details exercise technique, body mechanics    Person(s) Educated Patient    Methods Explanation;Demonstration;Tactile cues;Verbal cues    Comprehension Verbalized understanding;Returned demonstration;Verbal cues required;Tactile cues required              PT Short Term Goals - 05/05/21 1253       PT SHORT TERM GOAL #1   Title Patient will be independent in home exercise program to improve strength/mobility for better functional independence with ADLs.    Baseline 10/4 HEP provided 11/8: HEP compliant    Time 4    Period Weeks    Status Achieved    Target Date 03/08/21               PT Long Term Goals - 05/05/21 0001       PT LONG TERM GOAL #1   Title Patient will increase FOTO score to equal to or greater than  51/100   to demonstrate statistically significant improvement in mobility and quality of life.    Baseline 10/4: 42% 11/8: 36% 12/29: 45%    Time 12    Period Weeks    Status On-going    Target Date 07/28/21      PT LONG TERM GOAL #2   Title Patient (< 9 years old) will complete five times sit to stand  test in < 20 seconds indicating an increased LE strength and improved balance.    Baseline 10/4: 33.15 seconds with heavy BUE support 11/8: 30 seconds; 12/29: deferred to future session. Pt attempted today but unable to complete d/t increase in R side LBP    Time 12    Period Weeks    Status Partially Met    Target Date 07/28/21      PT LONG TERM GOAL #3  Title Patient will increase 10 meter walk test to <20 seconds as to improve gait speed for better community ambulation and to reduce fall risk.    Baseline 10/4: 43.17 seconds with RW 11/8: 40.8 seconds; 43 sec (0.23 m/s) with RW    Time 12    Period Weeks    Status Partially Met    Target Date 07/28/21      PT LONG TERM GOAL #4   Title Patient will reduce timed up and go to <11 seconds to reduce fall risk and demonstrate improved transfer/gait ability.    Baseline 10/4: 52.66 seconds with RW 11/8: 49 seconds with RW; 12/29: 44.3 sec    Time 12    Period Weeks    Status Partially Met    Target Date 07/28/21                   Plan - 05/12/21 1336     Clinical Impression Statement Patient tolerated standing and seated interventions well. Multiple sit to stands performed during the sessions with patient utilizing LE musculature indicating carryover from previous sessions. Standing stability is improving as patient is returning to tasks pre-hospitalization. The patient would benefit from continued skilled PT intervention to maximize QOL, safety, and function    Personal Factors and Comorbidities Age;Comorbidity 3+;Finances;Fitness;Past/Current Experience;Sex;Social Background;Time since onset of injury/illness/exacerbation;Transportation    Comorbidities HTN, SVT, neurogenic bowel, MS (1995), neuropathy, hx of wrist fx, HPV, Hypercholesteremia, adiposity, osteopenia, spasticity.    Examination-Activity Limitations Bathing;Bed Mobility;Bend;Carry;Continence;Dressing;Hygiene/Grooming;Stairs;Squat;Reach Overhead;Locomotion  Level;Lift;Stand;Transfers;Toileting    Examination-Participation Restrictions Church;Cleaning;Community Activity;Laundry;Volunteer;Shop;Meal Prep;Yard Work    [employer]chant [employer] officer Evolving/Moderate complexity    Rehab Potential Good    PT Frequency 2x / week    PT Duration 12 weeks    PT Treatment/Interventions ADLs/Self Care Home Management;Aquatic Therapy;Biofeedback;Cryotherapy;Electrical Stimulation;Iontophoresis 21m/ml Dexamethasone;Traction;Moist Heat;Ultrasound;DME Instruction;Gait training;Stair training;Functional mobility training;Therapeutic activities;Therapeutic exercise;Balance training;Neuromuscular re-education;Patient/family education;Orthotic Fit/Training;Wheelchair mobility training;Manual techniques;Compression bandaging;Passive range of motion;Dry needling;Energy conservation;Taping;Splinting;Vestibular    PT Next Visit Plan in // bars weight shift and LE strengthening    PT Home Exercise Plan HEP given    Consulted and Agree with Plan of Care Patient             Patient will benefit from skilled therapeutic intervention in order to improve the following deficits and impairments:  Abnormal gait, Decreased activity tolerance, Decreased balance, Decreased coordination, Decreased endurance, Decreased mobility, Decreased range of motion, Decreased strength, Difficulty walking, Impaired perceived functional ability, Impaired sensation, Impaired tone, Impaired UE functional use, Improper body mechanics, Postural dysfunction, Cardiopulmonary status limiting activity, Impaired flexibility, Increased muscle spasms  Visit Diagnosis: Other abnormalities of gait and mobility  Muscle weakness (generalized)  Unsteadiness on feet     Problem List Patient Active Problem List   Diagnosis Date Noted   Acute respiratory disease due to COVID-19 virus 11/21/2020   Weakness    Hypoalbuminemia due to protein-calorie malnutrition (HCC)    Neurogenic bowel    Neurogenic  bladder    Labile blood pressure    Neuropathic pain    Abscess of female pelvis    SVT (supraventricular tachycardia) (HCC)    Radial styloid tenosynovitis 03/12/2018   Wheelchair confinement 02/27/2018   Localized osteoporosis with current pathological fracture with routine healing 01/19/2017   Wrist fracture 01/16/2017   Sprain of ankle 03/23/2016   Closed fracture of lateral malleolus 03/16/2016   Health care maintenance 01/24/2016   Blood pressure elevated without history of HTN 10/25/2015   Essential hypertension 10/25/2015  Multiple sclerosis ([place]) 10/02/2015   Chronic left shoulder pain 07/19/2015   Multiple sclerosis exacerbation ([place]) 07/14/2015   MS (multiple sclerosis) ([place]) 11/26/2014   Increased body mass index 11/26/2014   HPV test positive 11/26/2014   Status post laparoscopic supracervical hysterectomy 11/26/2014   Galactorrhea 11/26/2014   Back ache 05/21/2014   Adiposity 05/21/2014   Disordered sleep 05/21/2014   Muscle spasticity 05/21/2014   Spasticity 05/21/2014   Calculus of kidney 58/85/0277   Renal colic 41/28/7867   Hypercholesteremia 08/19/2013   Hereditary and idiopathic neuropathy 08/19/2013   Hypercholesterolemia without hypertriglyceridemia 08/19/2013   Bladder infection, chronic 07/25/2012   Disorder of bladder function 07/25/2012   Incomplete bladder emptying 07/25/2012   Microscopic hematuria 07/25/2012   Right upper quadrant pain 07/25/2012    Janna Arch, PT, DPT  05/12/2021, 1:37 PM  [place] MAIN [facility] SERVICES [address] [place], [place], [address] Phone: [phone]   Fax:  [phone]  Name: Lisa Williams MRN: [id number] Date of Birth: 1960-07-22

## 2021-05-17 ENCOUNTER — Encounter: Payer: Self-pay | Admitting: Hematology and Oncology

## 2021-05-17 ENCOUNTER — Ambulatory Visit: Payer: PPO

## 2021-05-17 ENCOUNTER — Other Ambulatory Visit: Payer: Self-pay

## 2021-05-17 DIAGNOSIS — R2689 Other abnormalities of gait and mobility: Secondary | ICD-10-CM

## 2021-05-17 DIAGNOSIS — R2681 Unsteadiness on feet: Secondary | ICD-10-CM

## 2021-05-17 DIAGNOSIS — M6281 Muscle weakness (generalized): Secondary | ICD-10-CM

## 2021-05-17 NOTE — Therapy (Signed)
[place] MAIN [facility] SERVICES [address] [place], [place], [address] Phone: [phone]   Fax:  [phone]  Physical Therapy Treatment  Patient Details  Name: Lisa Williams Ruben MRN: [id number] Date of Birth: February 10, 1961 Referring Provider (PT): Margette Fast MD   Encounter Date: 05/17/2021   PT End of Session - 05/17/21 1222     Visit Number 23    Number of Visits 44    Date for PT Re-Evaluation 07/28/21    Authorization Type 3/10 with PN on 12/29    PT Start Time 1015    PT Stop Time 1059    PT Time Calculation (min) 44 min    Equipment Utilized During Treatment Gait belt    Activity Tolerance Patient tolerated treatment well    Behavior During Therapy [facility] for tasks assessed/performed             Past Medical History:  Diagnosis Date   Abdominal pain, right upper quadrant    Back pain    Calculus of kidney 12/09/2013   Chronic back pain    unspecified   Chronic left shoulder pain 01/09/91   Complication of anesthesia    Functional disorder of bladder    other   Galactorrhea 11/26/2014   Chronic    Hereditary and idiopathic neuropathy 08/19/2013   HPV test positive    Hypercholesteremia 08/19/2013   Hypertension    Incomplete bladder emptying    Microscopic hematuria    MS (multiple sclerosis) (HCC)    Muscle spasticity 05/21/2014   Nonspecific findings on examination of urine    other   Osteopenia    PONV (postoperative nausea and vomiting)    Status post laparoscopic supracervical hysterectomy 11/26/2014   Tobacco user 11/26/2014   Wrist fracture     Past Surgical History:  Procedure Laterality Date   bilateral tubal ligation  1996   BREAST CYST EXCISION Left 2002   FRACTURE SURGERY     KNEE SURGERY     right   LAPAROSCOPIC SUPRACERVICAL HYSTERECTOMY  08/05/2013   ORIF WRIST FRACTURE Left 01/17/2017   Procedure: OPEN REDUCTION INTERNAL FIXATION (ORIF) WRIST FRACTURE;  Surgeon: Lovell Sheehan, MD;   Location: [facility];  Service: Orthopedics;  Laterality: Left;   RADIOLOGY WITH ANESTHESIA N/A 03/18/2020   Procedure: MRI WITH ANESTHESIA CERVICAL SPINE AND BRAIN  WITH AND WITHOUT CONTRAST;  Surgeon: Radiologist, Medication, MD;  Location: [place];  Service: Radiology;  Laterality: N/A;   TUBAL LIGATION Bilateral    VAGINAL HYSTERECTOMY  03/2006    There were no vitals filed for this visit.   Subjective Assessment - 05/17/21 1217     Subjective Patient reports her left knee buckled and she sat back down on bed, did not hurt herself. No falls or LOB since last session.    Patient is accompained by: Family member    Pertinent History Patient is a pleasant 61 year old female who is returning to PT after MS exacerbation and hospitalization for COVID. Patient went to SNF for one month and then received home health PT for a month (8 visits).  PMH includes HTN, SVT, neurogenic bowel, MS (1995), neuropathy, hx of wrist fx, HPV, Hypercholesteremia, adiposity, osteopenia, spasticity. She wears bilateral AFOs and uses a RW and/or wheelchair for mobility. She drives an adapted car.    Limitations Lifting;Standing;Walking;House hold activities;Sitting    How long can you sit comfortably? n/a    How long can you stand comfortably? 2 minutes  How long can you walk comfortably? 40 ft in house    Patient Stated Goals to return to PLOF prior to hospitalization    Currently in Pain? No/denies                  Standing/walking with RW -walking with bRW with CGA and wheelchair follow, two rest breaks with use of ice x 130 ft. Patient has decreased foot clearance bilaterally.   -5 sit to stands with tow rest breaks, requires rest break after two reps each time plus one additional after second rest break  Seated: -RTB abduction 10x bilaterally -RTB adduction 10x each LE -hip flexion isometric 10x 3 second holds against PT resistance   Ice pack donned throughout session to minimize exacerbation of  MS symptoms in order to perform interventions.      Pt educated throughout session about proper posture and technique with exercises. Improved exercise technique, movement at target joints, use of target muscles after min to mod verbal, visual, tactile cues  Patient requires multiple rest breaks due to fatigue from high demand interventions. She is highly motivated throughout session. Her sit to stands continue to be challenging and will be an area for continued focus. The patient would benefit from continued skilled PT intervention to maximize QOL, safety, and function                    PT Education - 05/17/21 1221     Education provided Yes    Education Details exercise technique, body mechanics.    Person(s) Educated Patient    Methods Explanation;Demonstration;Tactile cues;Verbal cues    Comprehension Verbalized understanding;Returned demonstration;Verbal cues required;Tactile cues required              PT Short Term Goals - 05/05/21 1253       PT SHORT TERM GOAL #1   Title Patient will be independent in home exercise program to improve strength/mobility for better functional independence with ADLs.    Baseline 10/4 HEP provided 11/8: HEP compliant    Time 4    Period Weeks    Status Achieved    Target Date 03/08/21               PT Long Term Goals - 05/05/21 0001       PT LONG TERM GOAL #1   Title Patient will increase FOTO score to equal to or greater than  51/100   to demonstrate statistically significant improvement in mobility and quality of life.    Baseline 10/4: 42% 11/8: 36% 12/29: 45%    Time 12    Period Weeks    Status On-going    Target Date 07/28/21      PT LONG TERM GOAL #2   Title Patient (< 39 years old) will complete five times sit to stand test in < 20 seconds indicating an increased LE strength and improved balance.    Baseline 10/4: 33.15 seconds with heavy BUE support 11/8: 30 seconds; 12/29: deferred to future session.  Pt attempted today but unable to complete d/t increase in R side LBP    Time 12    Period Weeks    Status Partially Met    Target Date 07/28/21      PT LONG TERM GOAL #3   Title Patient will increase 10 meter walk test to <20 seconds as to improve gait speed for better community ambulation and to reduce fall risk.    Baseline 10/4: 43.17 seconds with  RW 11/8: 40.8 seconds; 43 sec (0.23 m/s) with RW    Time 12    Period Weeks    Status Partially Met    Target Date 07/28/21      PT LONG TERM GOAL #4   Title Patient will reduce timed up and go to <11 seconds to reduce fall risk and demonstrate improved transfer/gait ability.    Baseline 10/4: 52.66 seconds with RW 11/8: 49 seconds with RW; 12/29: 44.3 sec    Time 12    Period Weeks    Status Partially Met    Target Date 07/28/21                   Plan - 05/17/21 1231     Clinical Impression Statement Patient requires multiple rest breaks due to fatigue from high demand interventions. She is highly motivated throughout session. Her sit to stands continue to be challenging and will be an area for continued focus. The patient would benefit from continued skilled PT intervention to maximize QOL, safety, and function    Personal Factors and Comorbidities Age;Comorbidity 3+;Finances;Fitness;Past/Current Experience;Sex;Social Background;Time since onset of injury/illness/exacerbation;Transportation    Comorbidities HTN, SVT, neurogenic bowel, MS (1995), neuropathy, hx of wrist fx, HPV, Hypercholesteremia, adiposity, osteopenia, spasticity.    Examination-Activity Limitations Bathing;Bed Mobility;Bend;Carry;Continence;Dressing;Hygiene/Grooming;Stairs;Squat;Reach Overhead;Locomotion Level;Lift;Stand;Transfers;Toileting    Examination-Participation Restrictions Church;Cleaning;Community Activity;Laundry;Volunteer;Shop;Meal Prep;Yard Work    [employer]chant [employer] officer Evolving/Moderate complexity    Rehab Potential Good    PT  Frequency 2x / week    PT Duration 12 weeks    PT Treatment/Interventions ADLs/Self Care Home Management;Aquatic Therapy;Biofeedback;Cryotherapy;Electrical Stimulation;Iontophoresis 21m/ml Dexamethasone;Traction;Moist Heat;Ultrasound;DME Instruction;Gait training;Stair training;Functional mobility training;Therapeutic activities;Therapeutic exercise;Balance training;Neuromuscular re-education;Patient/family education;Orthotic Fit/Training;Wheelchair mobility training;Manual techniques;Compression bandaging;Passive range of motion;Dry needling;Energy conservation;Taping;Splinting;Vestibular    PT Next Visit Plan in // bars weight shift and LE strengthening    PT Home Exercise Plan HEP given    Consulted and Agree with Plan of Care Patient             Patient will benefit from skilled therapeutic intervention in order to improve the following deficits and impairments:  Abnormal gait, Decreased activity tolerance, Decreased balance, Decreased coordination, Decreased endurance, Decreased mobility, Decreased range of motion, Decreased strength, Difficulty walking, Impaired perceived functional ability, Impaired sensation, Impaired tone, Impaired UE functional use, Improper body mechanics, Postural dysfunction, Cardiopulmonary status limiting activity, Impaired flexibility, Increased muscle spasms  Visit Diagnosis: Other abnormalities of gait and mobility  Muscle weakness (generalized)  Unsteadiness on feet     Problem List Patient Active Problem List   Diagnosis Date Noted   Acute respiratory disease due to COVID-19 virus 11/21/2020   Weakness    Hypoalbuminemia due to protein-calorie malnutrition (HCC)    Neurogenic bowel    Neurogenic bladder    Labile blood pressure    Neuropathic pain    Abscess of female pelvis    SVT (supraventricular tachycardia) (HCC)    Radial styloid tenosynovitis 03/12/2018   Wheelchair confinement 02/27/2018   Localized osteoporosis with current  pathological fracture with routine healing 01/19/2017   Wrist fracture 01/16/2017   Sprain of ankle 03/23/2016   Closed fracture of lateral malleolus 03/16/2016   Health care maintenance 01/24/2016   Blood pressure elevated without history of HTN 10/25/2015   Essential hypertension 10/25/2015   Multiple sclerosis ([place] 10/02/2015   Chronic left shoulder pain 07/19/2015   Multiple sclerosis exacerbation ([place] 07/14/2015   MS (multiple sclerosis) ([place] 11/26/2014   Increased body mass index 11/26/2014   HPV  test positive 11/26/2014   Status post laparoscopic supracervical hysterectomy 11/26/2014   Galactorrhea 11/26/2014   Back ache 05/21/2014   Adiposity 05/21/2014   Disordered sleep 05/21/2014   Muscle spasticity 05/21/2014   Spasticity 05/21/2014   Calculus of kidney 35/59/7416   Renal colic 38/45/3646   Hypercholesteremia 08/19/2013   Hereditary and idiopathic neuropathy 08/19/2013   Hypercholesterolemia without hypertriglyceridemia 08/19/2013   Bladder infection, chronic 07/25/2012   Disorder of bladder function 07/25/2012   Incomplete bladder emptying 07/25/2012   Microscopic hematuria 07/25/2012   Right upper quadrant pain 07/25/2012    Janna Arch, PT, DPT  05/17/2021, 12:33 PM  [place] MAIN [facility] SERVICES [address] [place], [place], [address] Phone: [phone]   Fax:  [phone]  Name: Erendira Crabtree [place]ia MRN: [id number] Date of Birth: 02-Sep-1960

## 2021-05-18 ENCOUNTER — Encounter: Payer: Self-pay | Admitting: Hematology and Oncology

## 2021-05-19 ENCOUNTER — Ambulatory Visit: Payer: PPO | Admitting: Physical Therapy

## 2021-05-19 ENCOUNTER — Other Ambulatory Visit: Payer: Self-pay

## 2021-05-19 DIAGNOSIS — R2689 Other abnormalities of gait and mobility: Secondary | ICD-10-CM

## 2021-05-19 DIAGNOSIS — R278 Other lack of coordination: Secondary | ICD-10-CM

## 2021-05-19 DIAGNOSIS — M6281 Muscle weakness (generalized): Secondary | ICD-10-CM

## 2021-05-19 DIAGNOSIS — R262 Difficulty in walking, not elsewhere classified: Secondary | ICD-10-CM

## 2021-05-19 DIAGNOSIS — R2681 Unsteadiness on feet: Secondary | ICD-10-CM

## 2021-05-19 DIAGNOSIS — G35 Multiple sclerosis: Secondary | ICD-10-CM

## 2021-05-19 NOTE — Therapy (Signed)
[place] MAIN [facility] SERVICES [address] [place], [place], [address] Phone: [phone]   Fax:  [phone]  Physical Therapy Treatment  Patient Details  Name: Lisa Williams MRN: [id number] Date of Birth: [birth year] Referring Provider (PT): Margette Fast MD   Encounter Date: 05/19/2021   PT End of Session - 05/19/21 1125     Visit Number 24    Number of Visits 44    Date for PT Re-Evaluation 07/28/21    Authorization Type 3/10 with PN on 12/29    PT Start Time 1019    PT Stop Time 1059    PT Time Calculation (min) 40 min    Equipment Utilized During Treatment Gait belt    Activity Tolerance Patient tolerated treatment well;Patient limited by fatigue    Behavior During Therapy [facility] for tasks assessed/performed             Past Medical History:  Diagnosis Date   Abdominal pain, right upper quadrant    Back pain    Calculus of kidney 12/09/2013   Chronic back pain    unspecified   Chronic left shoulder pain 7/34/2876   Complication of anesthesia    Functional disorder of bladder    other   Galactorrhea 11/26/2014   Chronic    Hereditary and idiopathic neuropathy 08/19/2013   HPV test positive    Hypercholesteremia 08/19/2013   Hypertension    Incomplete bladder emptying    Microscopic hematuria    MS (multiple sclerosis) (HCC)    Muscle spasticity 05/21/2014   Nonspecific findings on examination of urine    other   Osteopenia    PONV (postoperative nausea and vomiting)    Status post laparoscopic supracervical hysterectomy 11/26/2014   Tobacco user 11/26/2014   Wrist fracture     Past Surgical History:  Procedure Laterality Date   bilateral tubal ligation  1996   BREAST CYST EXCISION Left 2002   FRACTURE SURGERY     KNEE SURGERY     right   LAPAROSCOPIC SUPRACERVICAL HYSTERECTOMY  08/05/2013   ORIF WRIST FRACTURE Left 01/17/2017   Procedure: OPEN REDUCTION INTERNAL FIXATION (ORIF) WRIST FRACTURE;  Surgeon:  Lovell Sheehan, MD;  Location: [facility];  Service: Orthopedics;  Laterality: Left;   RADIOLOGY WITH ANESTHESIA N/A 03/18/2020   Procedure: MRI WITH ANESTHESIA CERVICAL SPINE AND BRAIN  WITH AND WITHOUT CONTRAST;  Surgeon: Radiologist, Medication, MD;  Location: [place];  Service: Radiology;  Laterality: N/A;   TUBAL LIGATION Bilateral    VAGINAL HYSTERECTOMY  03/2006    There were no vitals filed for this visit.   Subjective Assessment - 05/19/21 1022     Subjective Patient reports no change since last session. States she is fine. Has been experiencing some spasms. Reports compliance with HEP.    Patient is accompained by: Family member    Pertinent History Patient is a pleasant 61 year old female who is returning to PT after MS exacerbation and hospitalization for COVID. Patient went to SNF for one month and then received home health PT for a month (8 visits).  PMH includes HTN, SVT, neurogenic bowel, MS (1995), neuropathy, hx of wrist fx, HPV, Hypercholesteremia, adiposity, osteopenia, spasticity. She wears bilateral AFOs and uses a RW and/or wheelchair for mobility. She drives an adapted car.    Limitations Lifting;Standing;Walking;House hold activities;Sitting    How long can you sit comfortably? n/a    How long can you stand comfortably? 2 minutes  How long can you walk comfortably? 40 ft in house    Patient Stated Goals to return to PLOF prior to hospitalization    Currently in Pain? No/denies               Standing/walking with RW -walking with bRW, CGA and wheelchair follow, use of ice. Total of 121f, 1 rest break. Patient has decreased foot clearance bilaterally, R>L.  -STS x5 reps with 1 rest break after 3rd rep; CGA.   Seated: -RTB abduction 2 x 10 bilaterally -RTB adduction 10x each LE -hip flexion isometric 10x 3 second holds against PT resistance  Seated Core: -weighted shoulder flexion w/o back support, holding 2000g ball in bilateral hands, 2 x10 -trunk  rotation with 2000g ball, 2 x 10 bilaterally  -resisted trunk flexion (crunch) in sitting, 2 x 10 -resisted trunk flexion with rotation (shoulder to opposite knee) in sitting; 10x each    Ice pack donned throughout session to minimize exacerbation of MS symptoms in order to perform interventions.      Pt educated throughout session about proper posture and technique with exercises. Improved exercise technique, movement at target joints, use of target muscles after min to mod verbal, visual, tactile cues   Patient demonstrates good motivation throughout PT session. POC was continued with functional activities including ambulation and STS. Greatest challenge presented during STS. Rest breaks taken frequently due to pt fatigue; pt able to self-pace and verbalize need for rest. Seated core was added to end of session with good patient feedback. The patient would benefit from continued skilled PT intervention to maximize QOL, safety, and function.           PT Short Term Goals - 05/05/21 1253       PT SHORT TERM GOAL #1   Title Patient will be independent in home exercise program to improve strength/mobility for better functional independence with ADLs.    Baseline 10/4 HEP provided 11/8: HEP compliant    Time 4    Period Weeks    Status Achieved    Target Date 03/08/21               PT Long Term Goals - 05/05/21 0001       PT LONG TERM GOAL #1   Title Patient will increase FOTO score to equal to or greater than  51/100   to demonstrate statistically significant improvement in mobility and quality of life.    Baseline 10/4: 42% 11/8: 36% 12/29: 45%    Time 12    Period Weeks    Status On-going    Target Date 07/28/21      PT LONG TERM GOAL #2   Title Patient (< 630years old) will complete five times sit to stand test in < 20 seconds indicating an increased LE strength and improved balance.    Baseline 10/4: 33.15 seconds with heavy BUE support 11/8: 30 seconds; 12/29:  deferred to future session. Pt attempted today but unable to complete d/t increase in R side LBP    Time 12    Period Weeks    Status Partially Met    Target Date 07/28/21      PT LONG TERM GOAL #3   Title Patient will increase 10 meter walk test to <20 seconds as to improve gait speed for better community ambulation and to reduce fall risk.    Baseline 10/4: 43.17 seconds with RW 11/8: 40.8 seconds; 43 sec (0.23 m/s) with RW  Time 12    Period Weeks    Status Partially Met    Target Date 07/28/21      PT LONG TERM GOAL #4   Title Patient will reduce timed up and go to <11 seconds to reduce fall risk and demonstrate improved transfer/gait ability.    Baseline 10/4: 52.66 seconds with RW 11/8: 49 seconds with RW; 12/29: 44.3 sec    Time 12    Period Weeks    Status Partially Met    Target Date 07/28/21                   Plan - 05/19/21 1125     Clinical Impression Statement Patient demonstrates good motivation throughout PT session. POC was continued with functional activities including ambulation and STS. Greatest challenge presented during STS. Rest breaks taken frequently due to pt fatigue; pt able to self-pace and verbalize need for rest. Seated core was added to end of session with good patient feedback. The patient would benefit from continued skilled PT intervention to maximize QOL, safety, and function.    Personal Factors and Comorbidities Age;Comorbidity 3+;Finances;Fitness;Past/Current Experience;Sex;Social Background;Time since onset of injury/illness/exacerbation;Transportation    Comorbidities HTN, SVT, neurogenic bowel, MS (1995), neuropathy, hx of wrist fx, HPV, Hypercholesteremia, adiposity, osteopenia, spasticity.    Examination-Activity Limitations Bathing;Bed Mobility;Bend;Carry;Continence;Dressing;Hygiene/Grooming;Stairs;Squat;Reach Overhead;Locomotion Level;Lift;Stand;Transfers;Toileting    Examination-Participation Restrictions Church;Cleaning;Community  Activity;Laundry;Volunteer;Shop;Meal Prep;Yard Work    [employer]chant [employer] officer Evolving/Moderate complexity    Rehab Potential Good    PT Frequency 2x / week    PT Duration 12 weeks    PT Treatment/Interventions ADLs/Self Care Home Management;Aquatic Therapy;Biofeedback;Cryotherapy;Electrical Stimulation;Iontophoresis 65m/ml Dexamethasone;Traction;Moist Heat;Ultrasound;DME Instruction;Gait training;Stair training;Functional mobility training;Therapeutic activities;Therapeutic exercise;Balance training;Neuromuscular re-education;Patient/family education;Orthotic Fit/Training;Wheelchair mobility training;Manual techniques;Compression bandaging;Passive range of motion;Dry needling;Energy conservation;Taping;Splinting;Vestibular    PT Next Visit Plan in // bars weight shift and LE strengthening    PT Home Exercise Plan HEP given    Consulted and Agree with Plan of Care Patient             Patient will benefit from skilled therapeutic intervention in order to improve the following deficits and impairments:  Abnormal gait, Decreased activity tolerance, Decreased balance, Decreased coordination, Decreased endurance, Decreased mobility, Decreased range of motion, Decreased strength, Difficulty walking, Impaired perceived functional ability, Impaired sensation, Impaired tone, Impaired UE functional use, Improper body mechanics, Postural dysfunction, Cardiopulmonary status limiting activity, Impaired flexibility, Increased muscle spasms  Visit Diagnosis: Other abnormalities of gait and mobility  Muscle weakness (generalized)  Unsteadiness on feet  Other lack of coordination  Difficulty in walking, not elsewhere classified  Multiple sclerosis exacerbation (HCC)     Problem List Patient Active Problem List   Diagnosis Date Noted   Acute respiratory disease due to COVID-19 virus 11/21/2020   Weakness    Hypoalbuminemia due to protein-calorie malnutrition (HCC)    Neurogenic  bowel    Neurogenic bladder    Labile blood pressure    Neuropathic pain    Abscess of female pelvis    SVT (supraventricular tachycardia) (HCC)    Radial styloid tenosynovitis 03/12/2018   Wheelchair confinement 02/27/2018   Localized osteoporosis with current pathological fracture with routine healing 01/19/2017   Wrist fracture 01/16/2017   Sprain of ankle 03/23/2016   Closed fracture of lateral malleolus 03/16/2016   Health care maintenance 01/24/2016   Blood pressure elevated without history of HTN 10/25/2015   Essential hypertension 10/25/2015   Multiple sclerosis ([place] 10/02/2015   Chronic left shoulder pain 07/19/2015   Multiple  sclerosis exacerbation ([place]) 07/14/2015   MS (multiple sclerosis) ([place]) 11/26/2014   Increased body mass index 11/26/2014   HPV test positive 11/26/2014   Status post laparoscopic supracervical hysterectomy 11/26/2014   Galactorrhea 11/26/2014   Back ache 05/21/2014   Adiposity 05/21/2014   Disordered sleep 05/21/2014   Muscle spasticity 05/21/2014   Spasticity 05/21/2014   Calculus of kidney 01/41/0301   Renal colic 31/43/8887   Hypercholesteremia 08/19/2013   Hereditary and idiopathic neuropathy 08/19/2013   Hypercholesterolemia without hypertriglyceridemia 08/19/2013   Bladder infection, chronic 07/25/2012   Disorder of bladder function 07/25/2012   Incomplete bladder emptying 07/25/2012   Microscopic hematuria 07/25/2012   Right upper quadrant pain 07/25/2012    Patrina Levering PT, DPT   [facility] MAIN [facility] SERVICES [place], [place], [address] Phone: [phone]   Fax:  [phone]  Name: Lisa Williams MRN: [id number] Date of Birth: 12-28-1960

## 2021-05-24 ENCOUNTER — Ambulatory Visit: Payer: PPO

## 2021-05-24 ENCOUNTER — Other Ambulatory Visit: Payer: Self-pay

## 2021-05-24 DIAGNOSIS — R2689 Other abnormalities of gait and mobility: Secondary | ICD-10-CM

## 2021-05-24 DIAGNOSIS — R2681 Unsteadiness on feet: Secondary | ICD-10-CM

## 2021-05-24 DIAGNOSIS — M6281 Muscle weakness (generalized): Secondary | ICD-10-CM

## 2021-05-24 NOTE — Therapy (Signed)
[place] MAIN [facility] SERVICES [address] [place], [place], [address] Phone: [phone]   Fax:  [phone]  Physical Therapy Treatment  Patient Details  Name: Lisa Williams MRN: [id number] Date of Birth: 05-Aug-1960 Referring Provider (PT): Margette Fast MD   Encounter Date: 05/24/2021   PT End of Session - 05/24/21 1315     Visit Number 25    Number of Visits 44    Date for PT Re-Evaluation 07/28/21    Authorization Type 5/10 with PN on 12/29    PT Start Time 1015    PT Stop Time 1059    PT Time Calculation (min) 44 min    Equipment Utilized During Treatment Gait belt    Activity Tolerance Patient tolerated treatment well;Patient limited by fatigue    Behavior During Therapy [facility] for tasks assessed/performed             Past Medical History:  Diagnosis Date   Abdominal pain, right upper quadrant    Back pain    Calculus of kidney 12/09/2013   Chronic back pain    unspecified   Chronic left shoulder pain 06/19/2480   Complication of anesthesia    Functional disorder of bladder    other   Galactorrhea 11/26/2014   Chronic    Hereditary and idiopathic neuropathy 08/19/2013   HPV test positive    Hypercholesteremia 08/19/2013   Hypertension    Incomplete bladder emptying    Microscopic hematuria    MS (multiple sclerosis) (HCC)    Muscle spasticity 05/21/2014   Nonspecific findings on examination of urine    other   Osteopenia    PONV (postoperative nausea and vomiting)    Status post laparoscopic supracervical hysterectomy 11/26/2014   Tobacco user 11/26/2014   Wrist fracture     Past Surgical History:  Procedure Laterality Date   bilateral tubal ligation  1996   BREAST CYST EXCISION Left 2002   FRACTURE SURGERY     KNEE SURGERY     right   LAPAROSCOPIC SUPRACERVICAL HYSTERECTOMY  08/05/2013   ORIF WRIST FRACTURE Left 01/17/2017   Procedure: OPEN REDUCTION INTERNAL FIXATION (ORIF) WRIST FRACTURE;  Surgeon:  Lovell Sheehan, MD;  Location: [facility] ORS;  Service: Orthopedics;  Laterality: Left;   RADIOLOGY WITH ANESTHESIA N/A 03/18/2020   Procedure: MRI WITH ANESTHESIA CERVICAL SPINE AND BRAIN  WITH AND WITHOUT CONTRAST;  Surgeon: Radiologist, Medication, MD;  Location: [place];  Service: Radiology;  Laterality: N/A;   TUBAL LIGATION Bilateral    VAGINAL HYSTERECTOMY  03/2006    There were no vitals filed for this visit.   Subjective Assessment - 05/24/21 1314     Subjective Patient reports she is having spasms today. No falls or LOB since last session, still taking the van.    Patient is accompained by: Family member    Pertinent History Patient is a pleasant 61 year old female who is returning to PT after MS exacerbation and hospitalization for COVID. Patient went to SNF for one month and then received home health PT for a month (8 visits).  PMH includes HTN, SVT, neurogenic bowel, MS (1995), neuropathy, hx of wrist fx, HPV, Hypercholesteremia, adiposity, osteopenia, spasticity. She wears bilateral AFOs and uses a RW and/or wheelchair for mobility. She drives an adapted car.    Limitations Lifting;Standing;Walking;House hold activities;Sitting    How long can you sit comfortably? n/a    How long can you stand comfortably? 2 minutes    How  long can you walk comfortably? 40 ft in house    Patient Stated Goals to return to PLOF prior to hospitalization    Currently in Pain? No/denies                     Standing in // bars: -standing weight shift 10x each direction -tandem stance 15 second holds each LE placement Reach down and pick up cone from 6" step on the side cone pick up and pass to PT: 12" ; able to perform without UE support intermittently x2 trials    Seated: GTB:  -bicep curl 10x each UE -tricep extension 10x each UE -row 12x cue for scapular retraction -LE abduction 10x -LE adduction single limb at a time 10x each LE   STM with implementation of effleurage and  ptrissage to R low back for pain/spasms reduction x 9 minutes   Ice pack donned throughout session to minimize exacerbation of MS symptoms in order to perform interventions.      Patient's stability in standing is improving despite onset of back spasms. Patient is able to reach down and pick up cone without UE support, only requiring intermittent hand touch to stabilize self for first time. Patient demonstrates excellent motivation and is able to progress to stronger resistance for strengthening interventions. The patient would benefit from continued skilled PT intervention to maximize QOL, safety, and function.                    PT Education - 05/24/21 1315     Education provided Yes    Education Details exercise technique, body mechanics    Person(s) Educated Patient    Methods Explanation;Demonstration;Tactile cues;Verbal cues    Comprehension Verbalized understanding;Returned demonstration;Verbal cues required;Tactile cues required              PT Short Term Goals - 05/05/21 1253       PT SHORT TERM GOAL #1   Title Patient will be independent in home exercise program to improve strength/mobility for better functional independence with ADLs.    Baseline 10/4 HEP provided 11/8: HEP compliant    Time 4    Period Weeks    Status Achieved    Target Date 03/08/21               PT Long Term Goals - 05/05/21 0001       PT LONG TERM GOAL #1   Title Patient will increase FOTO score to equal to or greater than  51/100   to demonstrate statistically significant improvement in mobility and quality of life.    Baseline 10/4: 42% 11/8: 36% 12/29: 45%    Time 12    Period Weeks    Status On-going    Target Date 07/28/21      PT LONG TERM GOAL #2   Title Patient (< 50 years old) will complete five times sit to stand test in < 20 seconds indicating an increased LE strength and improved balance.    Baseline 10/4: 33.15 seconds with heavy BUE support 11/8: 30  seconds; 12/29: deferred to future session. Pt attempted today but unable to complete d/t increase in R side LBP    Time 12    Period Weeks    Status Partially Met    Target Date 07/28/21      PT LONG TERM GOAL #3   Title Patient will increase 10 meter walk test to <20 seconds as to improve gait speed for  better community ambulation and to reduce fall risk.    Baseline 10/4: 43.17 seconds with RW 11/8: 40.8 seconds; 43 sec (0.23 m/s) with RW    Time 12    Period Weeks    Status Partially Met    Target Date 07/28/21      PT LONG TERM GOAL #4   Title Patient will reduce timed up and go to <11 seconds to reduce fall risk and demonstrate improved transfer/gait ability.    Baseline 10/4: 52.66 seconds with RW 11/8: 49 seconds with RW; 12/29: 44.3 sec    Time 12    Period Weeks    Status Partially Met    Target Date 07/28/21                   Plan - 05/24/21 1320     Clinical Impression Statement Patient's stability in standing is improving despite onset of back spasms. Patient is able to reach down and pick up cone without UE support, only requiring intermittent hand touch to stabilize self for first time. Patient demonstrates excellent motivation and is able to progress to stronger resistance for strengthening interventions. The patient would benefit from continued skilled PT intervention to maximize QOL, safety, and function.    Personal Factors and Comorbidities Age;Comorbidity 3+;Finances;Fitness;Past/Current Experience;Sex;Social Background;Time since onset of injury/illness/exacerbation;Transportation    Comorbidities HTN, SVT, neurogenic bowel, MS (1995), neuropathy, hx of wrist fx, HPV, Hypercholesteremia, adiposity, osteopenia, spasticity.    Examination-Activity Limitations Bathing;Bed Mobility;Bend;Carry;Continence;Dressing;Hygiene/Grooming;Stairs;Squat;Reach Overhead;Locomotion Level;Lift;Stand;Transfers;Toileting    Examination-Participation Restrictions  Church;Cleaning;Community Activity;Laundry;Volunteer;Shop;Meal Prep;Yard Work    Merchant [employer] officer Evolving/Moderate complexity    Rehab Potential Good    PT Frequency 2x / week    PT Duration 12 weeks    PT Treatment/Interventions ADLs/Self Care Home Management;Aquatic Therapy;Biofeedback;Cryotherapy;Electrical Stimulation;Iontophoresis 41m/ml Dexamethasone;Traction;Moist Heat;Ultrasound;DME Instruction;Gait training;Stair training;Functional mobility training;Therapeutic activities;Therapeutic exercise;Balance training;Neuromuscular re-education;Patient/family education;Orthotic Fit/Training;Wheelchair mobility training;Manual techniques;Compression bandaging;Passive range of motion;Dry needling;Energy conservation;Taping;Splinting;Vestibular    PT Next Visit Plan in // bars weight shift and LE strengthening    PT Home Exercise Plan HEP given    Consulted and Agree with Plan of Care Patient             Patient will benefit from skilled therapeutic intervention in order to improve the following deficits and impairments:  Abnormal gait, Decreased activity tolerance, Decreased balance, Decreased coordination, Decreased endurance, Decreased mobility, Decreased range of motion, Decreased strength, Difficulty walking, Impaired perceived functional ability, Impaired sensation, Impaired tone, Impaired UE functional use, Improper body mechanics, Postural dysfunction, Cardiopulmonary status limiting activity, Impaired flexibility, Increased muscle spasms  Visit Diagnosis: Other abnormalities of gait and mobility  Muscle weakness (generalized)  Unsteadiness on feet     Problem List Patient Active Problem List   Diagnosis Date Noted   Acute respiratory disease due to COVID-19 virus 11/21/2020   Weakness    Hypoalbuminemia due to protein-calorie malnutrition (HCC)    Neurogenic bowel    Neurogenic bladder    Labile blood pressure    Neuropathic pain    Abscess of female  pelvis    SVT (supraventricular tachycardia) (HCC)    Radial styloid tenosynovitis 03/12/2018   Wheelchair confinement 02/27/2018   Localized osteoporosis with current pathological fracture with routine healing 01/19/2017   Wrist fracture 01/16/2017   Sprain of ankle 03/23/2016   Closed fracture of lateral malleolus 03/16/2016   Health care maintenance 01/24/2016   Blood pressure elevated without history of HTN 10/25/2015   Essential hypertension 10/25/2015   Multiple sclerosis ([place]  10/02/2015   Chronic left shoulder pain 07/19/2015   Multiple sclerosis exacerbation ([place]) 07/14/2015   MS (multiple sclerosis) ([place]) 11/26/2014   Increased body mass index 11/26/2014   HPV test positive 11/26/2014   Status post laparoscopic supracervical hysterectomy 11/26/2014   Galactorrhea 11/26/2014   Back ache 05/21/2014   Adiposity 05/21/2014   Disordered sleep 05/21/2014   Muscle spasticity 05/21/2014   Spasticity 05/21/2014   Calculus of kidney 56/86/1683   Renal colic 72/90/2111   Hypercholesteremia 08/19/2013   Hereditary and idiopathic neuropathy 08/19/2013   Hypercholesterolemia without hypertriglyceridemia 08/19/2013   Bladder infection, chronic 07/25/2012   Disorder of bladder function 07/25/2012   Incomplete bladder emptying 07/25/2012   Microscopic hematuria 07/25/2012   Right upper quadrant pain 07/25/2012    Janna Arch, PT, DPT  05/24/2021, 1:21 PM  [place] MAIN [facility] SERVICES [address] [place], [place], [address] Phone: [phone]   Fax:  [phone]  Name: Lisa Williams MRN: [id number] Date of Birth: [birth year]

## 2021-05-25 ENCOUNTER — Telehealth: Payer: Self-pay

## 2021-05-25 NOTE — Telephone Encounter (Signed)
Attempted to complete PA through [facility] and was unable to due to PA not having correct pt information.   I called pt's insurance ([employer]) and spoke with Mesha and Alfredo Bach and received no help. The first caller I spoke with advised me he could not release any of the medical information and told me to call another department. I called the claims department and again received no help and was advised due to technical difficulties PA could not be started and I would have to call back .  After 40 mins no resolution.  I also called [facility] to see if the key could be updated to reflect the correct pt information and was on hold for 15 mins with no resolution. I will re attempt this tomorrow.

## 2021-05-26 ENCOUNTER — Ambulatory Visit: Payer: PPO

## 2021-05-26 ENCOUNTER — Other Ambulatory Visit: Payer: Self-pay

## 2021-05-26 DIAGNOSIS — R2681 Unsteadiness on feet: Secondary | ICD-10-CM

## 2021-05-26 DIAGNOSIS — R2689 Other abnormalities of gait and mobility: Secondary | ICD-10-CM

## 2021-05-26 DIAGNOSIS — M6281 Muscle weakness (generalized): Secondary | ICD-10-CM

## 2021-05-26 NOTE — Telephone Encounter (Signed)
Faxed completed/signed PA to [facility] at [phone]. Received fax confirmation, waiting on determination.

## 2021-05-26 NOTE — Therapy (Signed)
[place] MAIN [facility] SERVICES [address] [place], [place], [address] Phone: [phone]   Fax:  [phone]  Physical Therapy Treatment  Patient Details  Name: Lisa Williams MRN: [id number] Date of Birth: 1961/04/06 Referring Provider (PT): Margette Fast MD   Encounter Date: 05/26/2021   PT End of Session - 05/26/21 1251     Visit Number 26    Number of Visits 44    Date for PT Re-Evaluation 07/28/21    Authorization Type 6/10 with PN on 12/29    PT Start Time 1015    PT Stop Time 1059    PT Time Calculation (min) 44 min    Equipment Utilized During Treatment Gait belt    Activity Tolerance Patient tolerated treatment well;Patient limited by fatigue    Behavior During Therapy [facility] for tasks assessed/performed             Past Medical History:  Diagnosis Date   Abdominal pain, right upper quadrant    Back pain    Calculus of kidney 12/09/2013   Chronic back pain    unspecified   Chronic left shoulder pain 5/82/5189   Complication of anesthesia    Functional disorder of bladder    other   Galactorrhea 11/26/2014   Chronic    Hereditary and idiopathic neuropathy 08/19/2013   HPV test positive    Hypercholesteremia 08/19/2013   Hypertension    Incomplete bladder emptying    Microscopic hematuria    MS (multiple sclerosis) (HCC)    Muscle spasticity 05/21/2014   Nonspecific findings on examination of urine    other   Osteopenia    PONV (postoperative nausea and vomiting)    Status post laparoscopic supracervical hysterectomy 11/26/2014   Tobacco user 11/26/2014   Wrist fracture     Past Surgical History:  Procedure Laterality Date   bilateral tubal ligation  1996   BREAST CYST EXCISION Left 2002   FRACTURE SURGERY     KNEE SURGERY     right   LAPAROSCOPIC SUPRACERVICAL HYSTERECTOMY  08/05/2013   ORIF WRIST FRACTURE Left 01/17/2017   Procedure: OPEN REDUCTION INTERNAL FIXATION (ORIF) WRIST FRACTURE;  Surgeon:  Lovell Sheehan, MD;  Location: [facility] OR[facility];  Service: Orthopedics;  Laterality: Left;   RADIOLOGY WITH ANESTHESIA N/A 03/18/2020   Procedure: MRI WITH ANESTHESIA CERVICAL SPINE AND BRAIN  WITH AND WITHOUT CONTRAST;  Surgeon: Radiologist, Medication, MD;  Location: [place];  Service: Radiology;  Laterality: N/A;   TUBAL LIGATION Bilateral    VAGINAL HYSTERECTOMY  03/2006    There were no vitals filed for this visit.   Subjective Assessment - 05/26/21 1429     Subjective Patient still having some spasms but not as many today. No falls or LOB since last session. Compliant with HEP.    Patient is accompained by: Family member    Pertinent History Patient is a pleasant 61 year old female who is returning to PT after MS exacerbation and hospitalization for COVID. Patient went to SNF for one month and then received home health PT for a month (8 visits).  PMH includes HTN, SVT, neurogenic bowel, MS (1995), neuropathy, hx of wrist fx, HPV, Hypercholesteremia, adiposity, osteopenia, spasticity. She wears bilateral AFOs and uses a RW and/or wheelchair for mobility. She drives an adapted car.    Limitations Lifting;Standing;Walking;House hold activities;Sitting    How long can you sit comfortably? n/a    How long can you stand comfortably? 2 minutes  How long can you walk comfortably? 40 ft in house    Patient Stated Goals to return to PLOF prior to hospitalization    Currently in Pain? No/denies               Standing/walking with RW -walking with bRW, CGA and wheelchair follow, use of ice. Total of 179f, 2 rest break. Patient has decreased foot clearance bilaterally, R>L as she fatigues. Heavy BUE support required as patient fatigued.     Seated: Reach for ball outside BOS and throw at target x 15 balls Yellow dynadisc hamstring isometric 10x plantarflexion, 10x dorsiflexion each LE   Weighted ball (2000 gr) -chest press 10x -overhead raise 10x -pass with PT while in upright position  10x -rainbow raises 10x each side     Ice pack donned throughout session to minimize exacerbation of MS symptoms in order to perform interventions.   Pt educated throughout session about proper posture and technique with exercises. Improved exercise technique, movement at target joints, use of target muscles after min to mod verbal, visual, tactile cues.    Patient tolerated progressive ambulation, ambulating longest duration since return from [place] Patient is highly motivated throughout session. Core activation is improving with decreased instability with pertubation's. The patient would benefit from continued skilled PT intervention to maximize QOL, safety, and function.                         PT Education - 05/26/21 1251     Education provided Yes    Education Details exercise technique, body mechanics    Person(s) Educated Patient    Methods Explanation;Demonstration;Tactile cues;Verbal cues    Comprehension Verbalized understanding;Returned demonstration;Verbal cues required;Tactile cues required              PT Short Term Goals - 05/05/21 1253       PT SHORT TERM GOAL #1   Title Patient will be independent in home exercise program to improve strength/mobility for better functional independence with ADLs.    Baseline 10/4 HEP provided 11/8: HEP compliant    Time 4    Period Weeks    Status Achieved    Target Date 03/08/21               PT Long Term Goals - 05/05/21 0001       PT LONG TERM GOAL #1   Title Patient will increase FOTO score to equal to or greater than  51/100   to demonstrate statistically significant improvement in mobility and quality of life.    Baseline 10/4: 42% 11/8: 36% 12/29: 45%    Time 12    Period Weeks    Status On-going    Target Date 07/28/21      PT LONG TERM GOAL #2   Title Patient (< 63years old) will complete five times sit to stand test in < 20 seconds indicating an increased LE  strength and improved balance.    Baseline 10/4: 33.15 seconds with heavy BUE support 11/8: 30 seconds; 12/29: deferred to future session. Pt attempted today but unable to complete d/t increase in R side LBP    Time 12    Period Weeks    Status Partially Met    Target Date 07/28/21      PT LONG TERM GOAL #3   Title Patient will increase 10 meter walk test to <20 seconds as to improve gait speed for better community ambulation and  to reduce fall risk.    Baseline 10/4: 43.17 seconds with RW 11/8: 40.8 seconds; 43 sec (0.23 m/s) with RW    Time 12    Period Weeks    Status Partially Met    Target Date 07/28/21      PT LONG TERM GOAL #4   Title Patient will reduce timed up and go to <11 seconds to reduce fall risk and demonstrate improved transfer/gait ability.    Baseline 10/4: 52.66 seconds with RW 11/8: 49 seconds with RW; 12/29: 44.3 sec    Time 12    Period Weeks    Status Partially Met    Target Date 07/28/21                   Plan - 05/26/21 1629     Clinical Impression Statement Patient tolerated progressive ambulation, ambulating longest duration since return from hospitalization from [place]. Patient is highly motivated throughout session. Core activation is improving with decreased instability with pertubation's. The patient would benefit from continued skilled PT intervention to maximize QOL, safety, and function.    Personal Factors and Comorbidities Age;Comorbidity 3+;Finances;Fitness;Past/Current Experience;Sex;Social Background;Time since onset of injury/illness/exacerbation;Transportation    Comorbidities HTN, SVT, neurogenic bowel, MS (1995), neuropathy, hx of wrist fx, HPV, Hypercholesteremia, adiposity, osteopenia, spasticity.    Examination-Activity Limitations Bathing;Bed Mobility;Bend;Carry;Continence;Dressing;Hygiene/Grooming;Stairs;Squat;Reach Overhead;Locomotion Level;Lift;Stand;Transfers;Toileting    Examination-Participation Restrictions  Church;Cleaning;Community Activity;Laundry;Volunteer;Shop;Meal Prep;Yard Work    Merchant navy officer Evolving/Moderate complexity    Rehab Potential Good    PT Frequency 2x / week    PT Duration 12 weeks    PT Treatment/Interventions ADLs/Self Care Home Management;Aquatic Therapy;Biofeedback;Cryotherapy;Electrical Stimulation;Iontophoresis 39m/ml Dexamethasone;Traction;Moist Heat;Ultrasound;DME Instruction;Gait training;Stair training;Functional mobility training;Therapeutic activities;Therapeutic exercise;Balance training;Neuromuscular re-education;Patient/family education;Orthotic Fit/Training;Wheelchair mobility training;Manual techniques;Compression bandaging;Passive range of motion;Dry needling;Energy conservation;Taping;Splinting;Vestibular    PT Next Visit Plan in // bars weight shift and LE strengthening    PT Home Exercise Plan HEP given    Consulted and Agree with Plan of Care Patient             Patient will benefit from skilled therapeutic intervention in order to improve the following deficits and impairments:  Abnormal gait, Decreased activity tolerance, Decreased balance, Decreased coordination, Decreased endurance, Decreased mobility, Decreased range of motion, Decreased strength, Difficulty walking, Impaired perceived functional ability, Impaired sensation, Impaired tone, Impaired UE functional use, Improper body mechanics, Postural dysfunction, Cardiopulmonary status limiting activity, Impaired flexibility, Increased muscle spasms  Visit Diagnosis: Other abnormalities of gait and mobility  Muscle weakness (generalized)  Unsteadiness on feet     Problem List Patient Active Problem List   Diagnosis Date Noted   Acute respiratory disease due to COVID-19 virus 11/21/2020   Weakness    Hypoalbuminemia due to protein-calorie malnutrition (HCC)    Neurogenic bowel    Neurogenic bladder    Labile blood pressure    Neuropathic pain    Abscess of female  pelvis    SVT (supraventricular tachycardia) (HCC)    Radial styloid tenosynovitis 03/12/2018   Wheelchair confinement 02/27/2018   Localized osteoporosis with current pathological fracture with routine healing 01/19/2017   Wrist fracture 01/16/2017   Sprain of ankle 03/23/2016   Closed fracture of lateral malleolus 03/16/2016   Health care maintenance 01/24/2016   Blood pressure elevated without history of HTN 10/25/2015   Essential hypertension 10/25/2015   Multiple sclerosis ([place] 10/02/2015   Chronic left shoulder pain 07/19/2015   Multiple sclerosis exacerbation ([place] 07/14/2015   MS (multiple sclerosis) ([place] 11/26/2014   Increased body  mass index 11/26/2014   HPV test positive 11/26/2014   Status post laparoscopic supracervical hysterectomy 11/26/2014   Galactorrhea 11/26/2014   Back ache 05/21/2014   Adiposity 05/21/2014   Disordered sleep 05/21/2014   Muscle spasticity 05/21/2014   Spasticity 05/21/2014   Calculus of kidney 38/46/6599   Renal colic 35/70/1779   Hypercholesteremia 08/19/2013   Hereditary and idiopathic neuropathy 08/19/2013   Hypercholesterolemia without hypertriglyceridemia 08/19/2013   Bladder infection, chronic 07/25/2012   Disorder of bladder function 07/25/2012   Incomplete bladder emptying 07/25/2012   Microscopic hematuria 07/25/2012   Right upper quadrant pain 07/25/2012    Janna Arch, PT, DPT  05/26/2021, 4:29 PM  [place] MAIN [facility] SERVICES [address] [place], [place], [address] Phone: [phone]   Fax:  [phone]  Name: Lisa Williams MRN: [id number] Date of Birth: 21-Jan-1961

## 2021-05-26 NOTE — Telephone Encounter (Signed)
I called [employer] back and was advised as of 05/08/21 [facility] rx benefit is no longer being utilized.   Pt rx benefit is under [employer] RX benefit now. I spoke with Marjie Skiff who advised the PA for Mayzent 2 mg has was already approved on 05/08/21-05/07/22, ref # [id number].   Nothing further needed on this case.

## 2021-05-26 NOTE — Telephone Encounter (Signed)
Paper PA has been completed and placed on Dr. Rexene Alberts desk for review/signature.  Once completed PA will be faxed for review.

## 2021-05-31 ENCOUNTER — Ambulatory Visit: Payer: PPO

## 2021-05-31 ENCOUNTER — Other Ambulatory Visit: Payer: Self-pay

## 2021-05-31 DIAGNOSIS — R2681 Unsteadiness on feet: Secondary | ICD-10-CM

## 2021-05-31 DIAGNOSIS — R2689 Other abnormalities of gait and mobility: Secondary | ICD-10-CM

## 2021-05-31 DIAGNOSIS — M6281 Muscle weakness (generalized): Secondary | ICD-10-CM

## 2021-05-31 NOTE — Therapy (Signed)
[place] MAIN [facility] SERVICES [address] [place] [address] [place], [place], [address] Phone: [phone]   Fax:  [phone]  Physical Therapy Treatment  Patient Details  Name: Lisa Williams MRN: [id number] Date of Birth: 1961/02/17 Referring Provider (PT): Margette Fast MD   Encounter Date: 05/31/2021   PT End of Session - 05/31/21 1105     Visit Number 27    Number of Visits 44    Date for PT Re-Evaluation 07/28/21    Authorization Type 7/10 with PN on 12/29    PT Start Time 1015    PT Stop Time 1059    PT Time Calculation (min) 44 min    Equipment Utilized During Treatment Gait belt    Activity Tolerance Patient tolerated treatment well;Patient limited by fatigue    Behavior During Therapy [facility] for tasks assessed/performed             Past Medical History:  Diagnosis Date   Abdominal pain, right upper quadrant    Back pain    Calculus of kidney 12/09/2013   Chronic back pain    unspecified   Chronic left shoulder pain 7/68/0881   Complication of anesthesia    Functional disorder of bladder    other   Galactorrhea 11/26/2014   Chronic    Hereditary and idiopathic neuropathy 08/19/2013   HPV test positive    Hypercholesteremia 08/19/2013   Hypertension    Incomplete bladder emptying    Microscopic hematuria    MS (multiple sclerosis) (HCC)    Muscle spasticity 05/21/2014   Nonspecific findings on examination of urine    other   Osteopenia    PONV (postoperative nausea and vomiting)    Status post laparoscopic supracervical hysterectomy 11/26/2014   Tobacco user 11/26/2014   Wrist fracture     Past Surgical History:  Procedure Laterality Date   bilateral tubal ligation  1996   BREAST CYST EXCISION Left 2002   FRACTURE SURGERY     KNEE SURGERY     right   LAPAROSCOPIC SUPRACERVICAL HYSTERECTOMY  08/05/2013   ORIF WRIST FRACTURE Left 01/17/2017   Procedure: OPEN REDUCTION INTERNAL FIXATION (ORIF) WRIST FRACTURE;  Surgeon:  Lovell Sheehan, MD;  Location: [facility];  Service: Orthopedics;  Laterality: Left;   RADIOLOGY WITH ANESTHESIA N/A 03/18/2020   Procedure: MRI WITH ANESTHESIA CERVICAL SPINE AND BRAIN  WITH AND WITHOUT CONTRAST;  Surgeon: Radiologist, Medication, MD;  Location: [place];  Service: Radiology;  Laterality: N/A;   TUBAL LIGATION Bilateral    VAGINAL HYSTERECTOMY  03/2006    There were no vitals filed for this visit.   Subjective Assessment - 05/31/21 1104     Subjective Patient reports she is having back spasms. No falls or LOB since last session.    Patient is accompained by: Family member    Pertinent History Patient is a pleasant 61 year old female who is returning to PT after MS exacerbation and hospitalization for COVID. Patient went to SNF for one month and then received home health PT for a month (8 visits).  PMH includes HTN, SVT, neurogenic bowel, MS (1995), neuropathy, hx of wrist fx, HPV, Hypercholesteremia, adiposity, osteopenia, spasticity. She wears bilateral AFOs and uses a RW and/or wheelchair for mobility. She drives an adapted car.    Limitations Lifting;Standing;Walking;House hold activities;Sitting    How long can you sit comfortably? n/a    How long can you stand comfortably? 2 minutes    How long can you walk  comfortably? 40 ft in house    Patient Stated Goals to return to PLOF prior to hospitalization    Currently in Pain? Yes    Pain Score 2     Pain Location Back    Pain Orientation Lower    Pain Descriptors / Indicators Spasm    Pain Type Acute pain    Pain Onset 1 to 4 weeks ago    Pain Frequency Intermittent               STM with implementation of effleurage and pettrisage to R lumbar paraspinals for spasm reduction and pain relief x 8 minutes  Seated boxing: cues for sequencing, body mechanics, and pace/rhythm for functional contraction and timing of muscle recruitment: -Cross body punches to mitts on PT hands with no back support for focused core  stabilization with crossing midline 2x 60 seconds  -Cross body punch with secondary combination of elbow for full cross body rotation to PT mitt for trunk stability, coordination, and cardiovascular challenge x 60 seconds -Upper cut to mitts in PT hands for core activation without back support with perturbations 60 seconds  GTB abduction 10x each LE GTB adduction 10x each LE Isometric hip flexion 10x each LE  Hamstring lengthening 30 seconds each LE  Pt educated throughout session about proper posture and technique with exercises. Improved exercise technique, movement at target joints, use of target muscles after min to mod verbal, visual, tactile cues.     Patient presents with increased spasms of low back that radiate to lower leg. Patient's spasms reduced with manual and stretching by end of session. Core stability and UE strength increased and tolerated well.  The patient would benefit from continued skilled PT intervention to maximize QOL, safety, and function.                     PT Education - 05/31/21 1105     Education provided Yes    Education Details exercise technique, body mechanics    Person(s) Educated Patient    Methods Explanation;Demonstration;Tactile cues;Verbal cues    Comprehension Verbalized understanding;Returned demonstration;Verbal cues required;Tactile cues required              PT Short Term Goals - 05/05/21 1253       PT SHORT TERM GOAL #1   Title Patient will be independent in home exercise program to improve strength/mobility for better functional independence with ADLs.    Baseline 10/4 HEP provided 11/8: HEP compliant    Time 4    Period Weeks    Status Achieved    Target Date 03/08/21               PT Long Term Goals - 05/05/21 0001       PT LONG TERM GOAL #1   Title Patient will increase FOTO score to equal to or greater than  51/100   to demonstrate statistically significant improvement in mobility and quality of  life.    Baseline 10/4: 42% 11/8: 36% 12/29: 45%    Time 12    Period Weeks    Status On-going    Target Date 07/28/21      PT LONG TERM GOAL #2   Title Patient (< 68 years old) will complete five times sit to stand test in < 20 seconds indicating an increased LE strength and improved balance.    Baseline 10/4: 33.15 seconds with heavy BUE support 11/8: 30 seconds; 12/29: deferred to future session. Pt  attempted today but unable to complete d/t increase in R side LBP    Time 12    Period Weeks    Status Partially Met    Target Date 07/28/21      PT LONG TERM GOAL #3   Title Patient will increase 10 meter walk test to <20 seconds as to improve gait speed for better community ambulation and to reduce fall risk.    Baseline 10/4: 43.17 seconds with RW 11/8: 40.8 seconds; 43 sec (0.23 m/s) with RW    Time 12    Period Weeks    Status Partially Met    Target Date 07/28/21      PT LONG TERM GOAL #4   Title Patient will reduce timed up and go to <11 seconds to reduce fall risk and demonstrate improved transfer/gait ability.    Baseline 10/4: 52.66 seconds with RW 11/8: 49 seconds with RW; 12/29: 44.3 sec    Time 12    Period Weeks    Status Partially Met    Target Date 07/28/21                   Plan - 05/31/21 1353     Clinical Impression Statement Patient presents with increased spasms of low back that radiate to lower leg. Patient's spasms reduced with manual and stretching by end of session. Core stability and UE strength increased and tolerated well.  The patient would benefit from continued skilled PT intervention to maximize QOL, safety, and function.    Personal Factors and Comorbidities Age;Comorbidity 3+;Finances;Fitness;Past/Current Experience;Sex;Social Background;Time since onset of injury/illness/exacerbation;Transportation    Comorbidities HTN, SVT, neurogenic bowel, MS (1995), neuropathy, hx of wrist fx, HPV, Hypercholesteremia, adiposity, osteopenia,  spasticity.    Examination-Activity Limitations Bathing;Bed Mobility;Bend;Carry;Continence;Dressing;Hygiene/Grooming;Stairs;Squat;Reach Overhead;Locomotion Level;Lift;Stand;Transfers;Toileting    Examination-Participation Restrictions Church;Cleaning;Community Activity;Laundry;Volunteer;Shop;Meal Prep;Yard Work    [employer]chant [employer] officer Evolving/Moderate complexity    Rehab Potential Good    PT Frequency 2x / week    PT Duration 12 weeks    PT Treatment/Interventions ADLs/Self Care Home Management;Aquatic Therapy;Biofeedback;Cryotherapy;Electrical Stimulation;Iontophoresis 74m/ml Dexamethasone;Traction;Moist Heat;Ultrasound;DME Instruction;Gait training;Stair training;Functional mobility training;Therapeutic activities;Therapeutic exercise;Balance training;Neuromuscular re-education;Patient/family education;Orthotic Fit/Training;Wheelchair mobility training;Manual techniques;Compression bandaging;Passive range of motion;Dry needling;Energy conservation;Taping;Splinting;Vestibular    PT Next Visit Plan in // bars weight shift and LE strengthening    PT Home Exercise Plan HEP given    Consulted and Agree with Plan of Care Patient             Patient will benefit from skilled therapeutic intervention in order to improve the following deficits and impairments:  Abnormal gait, Decreased activity tolerance, Decreased balance, Decreased coordination, Decreased endurance, Decreased mobility, Decreased range of motion, Decreased strength, Difficulty walking, Impaired perceived functional ability, Impaired sensation, Impaired tone, Impaired UE functional use, Improper body mechanics, Postural dysfunction, Cardiopulmonary status limiting activity, Impaired flexibility, Increased muscle spasms  Visit Diagnosis: Other abnormalities of gait and mobility  Muscle weakness (generalized)  Unsteadiness on feet     Problem List Patient Active Problem List   Diagnosis Date Noted   Acute  respiratory disease due to COVID-19 virus 11/21/2020   Weakness    Hypoalbuminemia due to protein-calorie malnutrition (HCC)    Neurogenic bowel    Neurogenic bladder    Labile blood pressure    Neuropathic pain    Abscess of female pelvis    SVT (supraventricular tachycardia) (HCC)    Radial styloid tenosynovitis 03/12/2018   Wheelchair confinement 02/27/2018   Localized osteoporosis with current pathological fracture with routine  healing 01/19/2017   Wrist fracture 01/16/2017   Sprain of ankle 03/23/2016   Closed fracture of lateral malleolus 03/16/2016   Health care maintenance 01/24/2016   Blood pressure elevated without history of HTN 10/25/2015   Essential hypertension 10/25/2015   Multiple sclerosis ([place]) 10/02/2015   Chronic left shoulder pain 07/19/2015   Multiple sclerosis exacerbation ([place]) 07/14/2015   MS (multiple sclerosis) ([place]) 11/26/2014   Increased body mass index 11/26/2014   HPV test positive 11/26/2014   Status post laparoscopic supracervical hysterectomy 11/26/2014   Galactorrhea 11/26/2014   Back ache 05/21/2014   Adiposity 05/21/2014   Disordered sleep 05/21/2014   Muscle spasticity 05/21/2014   Spasticity 05/21/2014   Calculus of kidney 32/75/5623   Renal colic 92/15/1582   Hypercholesteremia 08/19/2013   Hereditary and idiopathic neuropathy 08/19/2013   Hypercholesterolemia without hypertriglyceridemia 08/19/2013   Bladder infection, chronic 07/25/2012   Disorder of bladder function 07/25/2012   Incomplete bladder emptying 07/25/2012   Microscopic hematuria 07/25/2012   Right upper quadrant pain 07/25/2012    Janna Arch, PT, DPT  05/31/2021, 1:54 PM  [place] MAIN [facility] SERVICES [address] [place], [place], [address] Phone: [phone]   Fax:  [phone]  Name: Lisa Williams MRN: [id number] Date of Birth: 11-03-1960

## 2021-06-02 ENCOUNTER — Ambulatory Visit: Payer: PPO

## 2021-06-02 ENCOUNTER — Other Ambulatory Visit: Payer: Self-pay

## 2021-06-02 DIAGNOSIS — R2681 Unsteadiness on feet: Secondary | ICD-10-CM

## 2021-06-02 DIAGNOSIS — R2689 Other abnormalities of gait and mobility: Secondary | ICD-10-CM

## 2021-06-02 DIAGNOSIS — M6281 Muscle weakness (generalized): Secondary | ICD-10-CM

## 2021-06-02 NOTE — Therapy (Signed)
[place] MAIN [facility] SERVICES [address] [place], [place], [address] Phone: [phone]   Fax:  [phone]  Physical Therapy Treatment  Patient Details  Name: Lisa Williams MRN: [id number] Date of Birth: 1960-06-27 Referring Provider (PT): Margette Fast MD   Encounter Date: 06/02/2021   PT End of Session - 06/02/21 0950     Visit Number 28    Number of Visits 44    Date for PT Re-Evaluation 07/28/21    Authorization Type 8/10 with PN on 12/29    PT Start Time 1010    PT Stop Time 1058    PT Time Calculation (min) 48 min    Equipment Utilized During Treatment Gait belt    Activity Tolerance Patient tolerated treatment well;Patient limited by fatigue    Behavior During Therapy [facility] for tasks assessed/performed             Past Medical History:  Diagnosis Date   Abdominal pain, right upper quadrant    Back pain    Calculus of kidney 12/09/2013   Chronic back pain    unspecified   Chronic left shoulder pain 07/21/1759   Complication of anesthesia    Functional disorder of bladder    other   Galactorrhea 11/26/2014   Chronic    Hereditary and idiopathic neuropathy 08/19/2013   HPV test positive    Hypercholesteremia 08/19/2013   Hypertension    Incomplete bladder emptying    Microscopic hematuria    MS (multiple sclerosis) (HCC)    Muscle spasticity 05/21/2014   Nonspecific findings on examination of urine    other   Osteopenia    PONV (postoperative nausea and vomiting)    Status post laparoscopic supracervical hysterectomy 11/26/2014   Tobacco user 11/26/2014   Wrist fracture     Past Surgical History:  Procedure Laterality Date   bilateral tubal ligation  1996   BREAST CYST EXCISION Left 2002   FRACTURE SURGERY     KNEE SURGERY     right   LAPAROSCOPIC SUPRACERVICAL HYSTERECTOMY  08/05/2013   ORIF WRIST FRACTURE Left 01/17/2017   Procedure: OPEN REDUCTION INTERNAL FIXATION (ORIF) WRIST FRACTURE;  Surgeon:  Lovell Sheehan, MD;  Location: [facility];  Service: Orthopedics;  Laterality: Left;   RADIOLOGY WITH ANESTHESIA N/A 03/18/2020   Procedure: MRI WITH ANESTHESIA CERVICAL SPINE AND BRAIN  WITH AND WITHOUT CONTRAST;  Surgeon: Radiologist, Medication, MD;  Location: [place];  Service: Radiology;  Laterality: N/A;   TUBAL LIGATION Bilateral    VAGINAL HYSTERECTOMY  03/2006    There were no vitals filed for this visit.   Subjective Assessment - 06/02/21 1224     Subjective Patient reports compliance with HEP, had a relaxing day yesterday.    Patient is accompained by: Family member    Pertinent History Patient is a pleasant 62 year old female who is returning to PT after MS exacerbation and hospitalization for COVID. Patient went to SNF for one month and then received home health PT for a month (8 visits).  PMH includes HTN, SVT, neurogenic bowel, MS (1995), neuropathy, hx of wrist fx, HPV, Hypercholesteremia, adiposity, osteopenia, spasticity. She wears bilateral AFOs and uses a RW and/or wheelchair for mobility. She drives an adapted car.    Limitations Lifting;Standing;Walking;House hold activities;Sitting    How long can you sit comfortably? n/a    How long can you stand comfortably? 2 minutes    How long can you walk comfortably? 40 ft in  house    Patient Stated Goals to return to PLOF prior to hospitalization    Currently in Pain? Yes    Pain Score 1     Pain Location Back    Pain Orientation Lower    Pain Descriptors / Indicators Spasm    Pain Type Acute pain    Pain Onset 1 to 4 weeks ago    Pain Frequency Intermittent                Standing in // bars: standing weight shift 10x each direction Reach down and pick up cone from 6" step on the side cone pick up and pass to PT: 12" ; able to perform without UE support intermittently  PVC pipe : chest press 10x, UE raise 10x, clockwise circle 10x; seated rest break between interventions   Seated: Sit on yellow dynadisc:   -static sit 30 seconds -lateral trunk lean 10x each side -anterior/posterior trunk lean 10x  -alternating UE raises 10x   GTB abduction 10x each LE GTB adduction 10x each LE GTB row 10x Overhead abduction raises 10x Scapular retraction with shoulder extension 10x Green ball adduction 10x    Ice pack donned throughout session to minimize exacerbation of MS symptoms in order to perform interventions.    Pt educated throughout session about proper posture and technique with exercises. Improved exercise technique, movement at target joints, use of target muscles after min to mod verbal, visual, tactile cues.    Patient tolerates progressive strengthening and core interventions. Utilization of dynadisc is challenging for patient and will be an area for continued focus for centering patient without UE support. Standing interventions without UE support are increasing in duration of standing indicating improved stability and strength. The patient would benefit from continued skilled PT intervention to maximize QOL, safety, and function                  PT Education - 06/02/21 0949     Education provided Yes    Education Details exercise technique, body mechanics    Person(s) Educated Patient    Methods Explanation;Demonstration;Tactile cues;Verbal cues    Comprehension Verbalized understanding;Returned demonstration;Verbal cues required;Tactile cues required              PT Short Term Goals - 05/05/21 1253       PT SHORT TERM GOAL #1   Title Patient will be independent in home exercise program to improve strength/mobility for better functional independence with ADLs.    Baseline 10/4 HEP provided 11/8: HEP compliant    Time 4    Period Weeks    Status Achieved    Target Date 03/08/21               PT Long Term Goals - 05/05/21 0001       PT LONG TERM GOAL #1   Title Patient will increase FOTO score to equal to or greater than  51/100   to demonstrate  statistically significant improvement in mobility and quality of life.    Baseline 10/4: 42% 11/8: 36% 12/29: 45%    Time 12    Period Weeks    Status On-going    Target Date 07/28/21      PT LONG TERM GOAL #2   Title Patient (< 27 years old) will complete five times sit to stand test in < 20 seconds indicating an increased LE strength and improved balance.    Baseline 10/4: 33.15 seconds with heavy BUE support 11/8:  30 seconds; 12/29: deferred to future session. Pt attempted today but unable to complete d/t increase in R side LBP    Time 12    Period Weeks    Status Partially Met    Target Date 07/28/21      PT LONG TERM GOAL #3   Title Patient will increase 10 meter walk test to <20 seconds as to improve gait speed for better community ambulation and to reduce fall risk.    Baseline 10/4: 43.17 seconds with RW 11/8: 40.8 seconds; 43 sec (0.23 m/s) with RW    Time 12    Period Weeks    Status Partially Met    Target Date 07/28/21      PT LONG TERM GOAL #4   Title Patient will reduce timed up and go to <11 seconds to reduce fall risk and demonstrate improved transfer/gait ability.    Baseline 10/4: 52.66 seconds with RW 11/8: 49 seconds with RW; 12/29: 44.3 sec    Time 12    Period Weeks    Status Partially Met    Target Date 07/28/21                   Plan - 06/02/21 1226     Clinical Impression Statement Patient tolerates progressive strengthening and core interventions. Utilization of dynadisc is challenging for patient and will be an area for continued focus for centering patient without UE support. Standing interventions without UE support are increasing in duration of standing indicating improved stability and strength. The patient would benefit from continued skilled PT intervention to maximize QOL, safety, and function    Personal Factors and Comorbidities Age;Comorbidity 3+;Finances;Fitness;Past/Current Experience;Sex;Social Background;Time since onset of  injury/illness/exacerbation;Transportation    Comorbidities HTN, SVT, neurogenic bowel, MS (1995), neuropathy, hx of wrist fx, HPV, Hypercholesteremia, adiposity, osteopenia, spasticity.    Examination-Activity Limitations Bathing;Bed Mobility;Bend;Carry;Continence;Dressing;Hygiene/Grooming;Stairs;Squat;Reach Overhead;Locomotion Level;Lift;Stand;Transfers;Toileting    Examination-Participation Restrictions Church;Cleaning;Community Activity;Laundry;Volunteer;Shop;Meal Prep;Yard Work    [employer]chant [employer] officer Evolving/Moderate complexity    Rehab Potential Good    PT Frequency 2x / week    PT Duration 12 weeks    PT Treatment/Interventions ADLs/Self Care Home Management;Aquatic Therapy;Biofeedback;Cryotherapy;Electrical Stimulation;Iontophoresis 32m/ml Dexamethasone;Traction;Moist Heat;Ultrasound;DME Instruction;Gait training;Stair training;Functional mobility training;Therapeutic activities;Therapeutic exercise;Balance training;Neuromuscular re-education;Patient/family education;Orthotic Fit/Training;Wheelchair mobility training;Manual techniques;Compression bandaging;Passive range of motion;Dry needling;Energy conservation;Taping;Splinting;Vestibular    PT Next Visit Plan in // bars weight shift and LE strengthening    PT Home Exercise Plan HEP given    Consulted and Agree with Plan of Care Patient             Patient will benefit from skilled therapeutic intervention in order to improve the following deficits and impairments:  Abnormal gait, Decreased activity tolerance, Decreased balance, Decreased coordination, Decreased endurance, Decreased mobility, Decreased range of motion, Decreased strength, Difficulty walking, Impaired perceived functional ability, Impaired sensation, Impaired tone, Impaired UE functional use, Improper body mechanics, Postural dysfunction, Cardiopulmonary status limiting activity, Impaired flexibility, Increased muscle spasms  Visit Diagnosis: Other  abnormalities of gait and mobility  Muscle weakness (generalized)  Unsteadiness on feet     Problem List Patient Active Problem List   Diagnosis Date Noted   Acute respiratory disease due to COVID-19 virus 11/21/2020   Weakness    Hypoalbuminemia due to protein-calorie malnutrition (HCC)    Neurogenic bowel    Neurogenic bladder    Labile blood pressure    Neuropathic pain    Abscess of female pelvis    SVT (supraventricular tachycardia) (HCC)    Radial  styloid tenosynovitis 03/12/2018   Wheelchair confinement 02/27/2018   Localized osteoporosis with current pathological fracture with routine healing 01/19/2017   Wrist fracture 01/16/2017   Sprain of ankle 03/23/2016   Closed fracture of lateral malleolus 03/16/2016   Health care maintenance 01/24/2016   Blood pressure elevated without history of HTN 10/25/2015   Essential hypertension 10/25/2015   Multiple sclerosis ([place]) 10/02/2015   Chronic left shoulder pain 07/19/2015   Multiple sclerosis exacerbation ([place]) 07/14/2015   MS (multiple sclerosis) ([place]) 11/26/2014   Increased body mass index 11/26/2014   HPV test positive 11/26/2014   Status post laparoscopic supracervical hysterectomy 11/26/2014   Galactorrhea 11/26/2014   Back ache 05/21/2014   Adiposity 05/21/2014   Disordered sleep 05/21/2014   Muscle spasticity 05/21/2014   Spasticity 05/21/2014   Calculus of kidney 67/89/3810   Renal colic 17/51/0258   Hypercholesteremia 08/19/2013   Hereditary and idiopathic neuropathy 08/19/2013   Hypercholesterolemia without hypertriglyceridemia 08/19/2013   Bladder infection, chronic 07/25/2012   Disorder of bladder function 07/25/2012   Incomplete bladder emptying 07/25/2012   Microscopic hematuria 07/25/2012   Right upper quadrant pain 07/25/2012    Janna Arch, PT, DPT  06/02/2021, 12:28 PM  [place] MAIN [facility] SERVICES [address] [place], [place], [address] Phone:  [phone]   Fax:  [phone]  Name: Lisa Williams MRN: [id number] Date of Birth: Nov 07, 1960

## 2021-06-03 ENCOUNTER — Encounter: Payer: Self-pay | Admitting: Neurology

## 2021-06-06 ENCOUNTER — Other Ambulatory Visit: Payer: Self-pay | Admitting: Neurology

## 2021-06-06 MED ORDER — GABAPENTIN 100 MG PO CAPS: 100.0000 mg | ORAL_CAPSULE | Freq: Two times a day (BID) | ORAL | 0 refills | Status: DC

## 2021-06-06 MED ORDER — BACLOFEN 20 MG PO TABS: 20.0000 mg | ORAL_TABLET | Freq: Three times a day (TID) | ORAL | 0 refills | Status: DC

## 2021-06-07 ENCOUNTER — Ambulatory Visit: Payer: PPO | Admitting: Physical Therapy

## 2021-06-07 ENCOUNTER — Other Ambulatory Visit: Payer: Self-pay

## 2021-06-07 DIAGNOSIS — R2689 Other abnormalities of gait and mobility: Secondary | ICD-10-CM

## 2021-06-07 DIAGNOSIS — R278 Other lack of coordination: Secondary | ICD-10-CM

## 2021-06-07 DIAGNOSIS — R2681 Unsteadiness on feet: Secondary | ICD-10-CM

## 2021-06-07 DIAGNOSIS — R262 Difficulty in walking, not elsewhere classified: Secondary | ICD-10-CM

## 2021-06-07 DIAGNOSIS — M6281 Muscle weakness (generalized): Secondary | ICD-10-CM

## 2021-06-07 DIAGNOSIS — G35 Multiple sclerosis: Secondary | ICD-10-CM

## 2021-06-07 NOTE — Therapy (Signed)
[place] MAIN [facility] SERVICES [address] [place], [place], [address] Phone: [phone]   Fax:  [phone]  Physical Therapy Treatment  Patient Details  Name: Lisa Williams MRN: [id number] Date of Birth: [birth year] Referring Provider (PT): Margette Fast MD   Encounter Date: 06/07/2021   PT End of Session - 06/07/21 1222     Visit Number 29    Number of Visits 44    Date for PT Re-Evaluation 07/28/21    Authorization Type 8/10 with PN on 12/29    PT Start Time 1020    PT Stop Time 1059    PT Time Calculation (min) 39 min    Equipment Utilized During Treatment Gait belt    Activity Tolerance Patient tolerated treatment well;Patient limited by fatigue    Behavior During Therapy [facility] for tasks assessed/performed             Past Medical History:  Diagnosis Date   Abdominal pain, right upper quadrant    Back pain    Calculus of kidney 12/09/2013   Chronic back pain    unspecified   Chronic left shoulder pain 9/38/1017   Complication of anesthesia    Functional disorder of bladder    other   Galactorrhea 11/26/2014   Chronic    Hereditary and idiopathic neuropathy 08/19/2013   HPV test positive    Hypercholesteremia 08/19/2013   Hypertension    Incomplete bladder emptying    Microscopic hematuria    MS (multiple sclerosis) (HCC)    Muscle spasticity 05/21/2014   Nonspecific findings on examination of urine    other   Osteopenia    PONV (postoperative nausea and vomiting)    Status post laparoscopic supracervical hysterectomy 11/26/2014   Tobacco user 11/26/2014   Wrist fracture     Past Surgical History:  Procedure Laterality Date   bilateral tubal ligation  1996   BREAST CYST EXCISION Left 2002   FRACTURE SURGERY     KNEE SURGERY     right   LAPAROSCOPIC SUPRACERVICAL HYSTERECTOMY  08/05/2013   ORIF WRIST FRACTURE Left 01/17/2017   Procedure: OPEN REDUCTION INTERNAL FIXATION (ORIF) WRIST FRACTURE;  Surgeon:  Lovell Sheehan, MD;  Location: [facility];  Service: Orthopedics;  Laterality: Left;   RADIOLOGY WITH ANESTHESIA N/A 03/18/2020   Procedure: MRI WITH ANESTHESIA CERVICAL SPINE AND BRAIN  WITH AND WITHOUT CONTRAST;  Surgeon: Radiologist, Medication, MD;  Location: [place];  Service: Radiology;  Laterality: N/A;   TUBAL LIGATION Bilateral    VAGINAL HYSTERECTOMY  03/2006    There were no vitals filed for this visit.   Subjective Assessment - 06/07/21 1023     Subjective Pt states she is doing well today. No changes since last session.    Pertinent History Patient is a pleasant 61 year old female who is returning to PT after MS exacerbation and hospitalization for COVID. Patient went to SNF for one month and then received home health PT for a month (8 visits).  PMH includes HTN, SVT, neurogenic bowel, MS (1995), neuropathy, hx of wrist fx, HPV, Hypercholesteremia, adiposity, osteopenia, spasticity. She wears bilateral AFOs and uses a RW and/or wheelchair for mobility. She drives an adapted car.    Currently in Pain? No/denies            INTERVENTIONS  Standing in // bars: standing weight shift 20x each direction, VC to slow movement    Seated: Sit on yellow dynadisc, narrow BOS:  -static sit  30 seconds -lateral trunk lean 10x each side -anterior/posterior trunk lean 10x  -trunk rotation 10x each side  -alternating UE raises 10x -alternating reach for combined A-P lean and slight rotation 10x each    GTB abduction 2x10x each LE GTB adduction 10x each LE GTB row 2x10 Overhead abduction raises 2x10 Scapular retraction with shoulder extension 2x10 Green ball adduction 10x  Isometric hip flexion, 2x10 BLE Isometric hamstring curl using yellow dynadisc, 2x10 BLE Isometric DF into yellow dynadisc, 10x each  Shoulder press using 2.5# bar, 2x10  Bicep curl using 2.5# bar, 2x10   Ice pack donned throughout session to minimize exacerbation of MS symptoms in order to perform  interventions.    Pt educated throughout session about proper posture and technique with exercises. Improved exercise technique, movement at target joints, use of target muscles after min to mod verbal, visual, tactile cues.      Today's session focused on seated strengthening. Patient tolerates progressive LE and UE strengthening and core interventions. Standing interventions attempted mid-session in // bars; pt reported she was unable to perform any further standing exercises due to muscular fatigue. Pt denies assist to perform STS - PT remained close for pt safety as process was challenging for pt. The patient would benefit from continued skilled PT intervention to maximize QOL, safety, and function.           PT Short Term Goals - 05/05/21 1253       PT SHORT TERM GOAL #1   Title Patient will be independent in home exercise program to improve strength/mobility for better functional independence with ADLs.    Baseline 10/4 HEP provided 11/8: HEP compliant    Time 4    Period Weeks    Status Achieved    Target Date 03/08/21               PT Long Term Goals - 05/05/21 0001       PT LONG TERM GOAL #1   Title Patient will increase FOTO score to equal to or greater than  51/100   to demonstrate statistically significant improvement in mobility and quality of life.    Baseline 10/4: 42% 11/8: 36% 12/29: 45%    Time 12    Period Weeks    Status On-going    Target Date 07/28/21      PT LONG TERM GOAL #2   Title Patient (< 73 years old) will complete five times sit to stand test in < 20 seconds indicating an increased LE strength and improved balance.    Baseline 10/4: 33.15 seconds with heavy BUE support 11/8: 30 seconds; 12/29: deferred to future session. Pt attempted today but unable to complete d/t increase in R side LBP    Time 12    Period Weeks    Status Partially Met    Target Date 07/28/21      PT LONG TERM GOAL #3   Title Patient will increase 10 meter walk  test to <20 seconds as to improve gait speed for better community ambulation and to reduce fall risk.    Baseline 10/4: 43.17 seconds with RW 11/8: 40.8 seconds; 43 sec (0.23 m/s) with RW    Time 12    Period Weeks    Status Partially Met    Target Date 07/28/21      PT LONG TERM GOAL #4   Title Patient will reduce timed up and go to <11 seconds to reduce fall risk and demonstrate improved transfer/gait  ability.    Baseline 10/4: 52.66 seconds with RW 11/8: 49 seconds with RW; 12/29: 44.3 sec    Time 12    Period Weeks    Status Partially Met    Target Date 07/28/21                   Plan - 06/07/21 1223     Clinical Impression Statement Today's session focused on seated strengthening. Patient tolerates progressive LE and UE strengthening and core interventions. Standing interventions attempted mid-session in // bars; pt reported she was unable to perform any further standing exercises due to muscular fatigue. Pt denies assist to perform STS - PT remained close for pt safety as process was challenging for pt. The patient would benefit from continued skilled PT intervention to maximize QOL, safety, and function.    Personal Factors and Comorbidities Age;Comorbidity 3+;Finances;Fitness;Past/Current Experience;Sex;Social Background;Time since onset of injury/illness/exacerbation;Transportation    Comorbidities HTN, SVT, neurogenic bowel, MS (1995), neuropathy, hx of wrist fx, HPV, Hypercholesteremia, adiposity, osteopenia, spasticity.    Examination-Activity Limitations Bathing;Bed Mobility;Bend;Carry;Continence;Dressing;Hygiene/Grooming;Stairs;Squat;Reach Overhead;Locomotion Level;Lift;Stand;Transfers;Toileting    Examination-Participation Restrictions Church;Cleaning;Community Activity;Laundry;Volunteer;Shop;Meal Prep;Yard Work    [employer]chant [employer] officer Evolving/Moderate complexity    Rehab Potential Good    PT Frequency 2x / week    PT Duration 12 weeks    PT  Treatment/Interventions ADLs/Self Care Home Management;Aquatic Therapy;Biofeedback;Cryotherapy;Electrical Stimulation;Iontophoresis 66m/ml Dexamethasone;Traction;Moist Heat;Ultrasound;DME Instruction;Gait training;Stair training;Functional mobility training;Therapeutic activities;Therapeutic exercise;Balance training;Neuromuscular re-education;Patient/family education;Orthotic Fit/Training;Wheelchair mobility training;Manual techniques;Compression bandaging;Passive range of motion;Dry needling;Energy conservation;Taping;Splinting;Vestibular    PT Next Visit Plan in // bars weight shift and LE strengthening    PT Home Exercise Plan HEP given    Consulted and Agree with Plan of Care Patient             Patient will benefit from skilled therapeutic intervention in order to improve the following deficits and impairments:  Abnormal gait, Decreased activity tolerance, Decreased balance, Decreased coordination, Decreased endurance, Decreased mobility, Decreased range of motion, Decreased strength, Difficulty walking, Impaired perceived functional ability, Impaired sensation, Impaired tone, Impaired UE functional use, Improper body mechanics, Postural dysfunction, Cardiopulmonary status limiting activity, Impaired flexibility, Increased muscle spasms  Visit Diagnosis: Other abnormalities of gait and mobility  Muscle weakness (generalized)  Unsteadiness on feet  Other lack of coordination  Difficulty in walking, not elsewhere classified  Multiple sclerosis exacerbation (HCC)     Problem List Patient Active Problem List   Diagnosis Date Noted   Acute respiratory disease due to COVID-19 virus 11/21/2020   Weakness    Hypoalbuminemia due to protein-calorie malnutrition (HCC)    Neurogenic bowel    Neurogenic bladder    Labile blood pressure    Neuropathic pain    Abscess of female pelvis    SVT (supraventricular tachycardia) (HCC)    Radial styloid tenosynovitis 03/12/2018   Wheelchair  confinement 02/27/2018   Localized osteoporosis with current pathological fracture with routine healing 01/19/2017   Wrist fracture 01/16/2017   Sprain of ankle 03/23/2016   Closed fracture of lateral malleolus 03/16/2016   Health care maintenance 01/24/2016   Blood pressure elevated without history of HTN 10/25/2015   Essential hypertension 10/25/2015   Multiple sclerosis ([place] 10/02/2015   Chronic left shoulder pain 07/19/2015   Multiple sclerosis exacerbation ([place] 07/14/2015   MS (multiple sclerosis) ([place] 11/26/2014   Increased body mass index 11/26/2014   HPV test positive 11/26/2014   Status post laparoscopic supracervical hysterectomy 11/26/2014   Galactorrhea 11/26/2014   Back ache 05/21/2014   Adiposity  05/21/2014   Disordered sleep 05/21/2014   Muscle spasticity 05/21/2014   Spasticity 05/21/2014   Calculus of kidney 16/02/9603   Renal colic 54/01/8118   Hypercholesteremia 08/19/2013   Hereditary and idiopathic neuropathy 08/19/2013   Hypercholesterolemia without hypertriglyceridemia 08/19/2013   Bladder infection, chronic 07/25/2012   Disorder of bladder function 07/25/2012   Incomplete bladder emptying 07/25/2012   Microscopic hematuria 07/25/2012   Right upper quadrant pain 07/25/2012    Patrina Levering PT, DPT 06/07/21 12:24 PM [place] MAIN [facility] SERVICES [place], [place], [address] Phone: [phone]   Fax:  [phone]  Name: Taejah Ohalloran Kerth MRN: [id number] Date of Birth: 1960-09-21

## 2021-06-09 ENCOUNTER — Other Ambulatory Visit: Payer: Self-pay

## 2021-06-09 ENCOUNTER — Ambulatory Visit: Payer: PPO

## 2021-06-09 ENCOUNTER — Ambulatory Visit: Payer: PPO | Attending: Neurology

## 2021-06-09 DIAGNOSIS — R2689 Other abnormalities of gait and mobility: Secondary | ICD-10-CM | POA: Diagnosis not present

## 2021-06-09 DIAGNOSIS — R262 Difficulty in walking, not elsewhere classified: Secondary | ICD-10-CM | POA: Diagnosis not present

## 2021-06-09 DIAGNOSIS — R2681 Unsteadiness on feet: Secondary | ICD-10-CM | POA: Diagnosis not present

## 2021-06-09 DIAGNOSIS — M6281 Muscle weakness (generalized): Secondary | ICD-10-CM | POA: Insufficient documentation

## 2021-06-09 NOTE — Therapy (Signed)
[place] MAIN [facility] SERVICES [address] [place], [place], [address] Phone: [phone]   Fax:  [phone]  Physical Therapy Treatment/Physical Therapy Progress Note   Dates of reporting period  05/05/2021  to   06/09/2021- Visit 30  Patient Details  Name: Lisa Williams MRN: [id number] Date of Birth: 1961/01/06 Referring Provider (PT): Margette Fast MD   Encounter Date: 06/09/2021   PT End of Session - 06/09/21 1015     Visit Number 30    Number of Visits 44    Date for PT Re-Evaluation 07/28/21    Authorization Type 8/10 with PN on 12/29    PT Start Time 1016    PT Stop Time 1048    PT Time Calculation (min) 32 min    Equipment Utilized During Treatment Gait belt    Activity Tolerance Patient tolerated treatment well;Patient limited by fatigue    Behavior During Therapy [facility] for tasks assessed/performed             Past Medical History:  Diagnosis Date   Abdominal pain, right upper quadrant    Back pain    Calculus of kidney 12/09/2013   Chronic back pain    unspecified   Chronic left shoulder pain 5/37/4827   Complication of anesthesia    Functional disorder of bladder    other   Galactorrhea 11/26/2014   Chronic    Hereditary and idiopathic neuropathy 08/19/2013   HPV test positive    Hypercholesteremia 08/19/2013   Hypertension    Incomplete bladder emptying    Microscopic hematuria    MS (multiple sclerosis) (HCC)    Muscle spasticity 05/21/2014   Nonspecific findings on examination of urine    other   Osteopenia    PONV (postoperative nausea and vomiting)    Status post laparoscopic supracervical hysterectomy 11/26/2014   Tobacco user 11/26/2014   Wrist fracture     Past Surgical History:  Procedure Laterality Date   bilateral tubal ligation  1996   BREAST CYST EXCISION Left 2002   FRACTURE SURGERY     KNEE SURGERY     right   LAPAROSCOPIC SUPRACERVICAL HYSTERECTOMY  08/05/2013   ORIF WRIST  FRACTURE Left 01/17/2017   Procedure: OPEN REDUCTION INTERNAL FIXATION (ORIF) WRIST FRACTURE;  Surgeon: Lovell Sheehan, MD;  Location: [facility];  Service: Orthopedics;  Laterality: Left;   RADIOLOGY WITH ANESTHESIA N/A 03/18/2020   Procedure: MRI WITH ANESTHESIA CERVICAL SPINE AND BRAIN  WITH AND WITHOUT CONTRAST;  Surgeon: Radiologist, Medication, MD;  Location: [place];  Service: Radiology;  Laterality: N/A;   TUBAL LIGATION Bilateral    VAGINAL HYSTERECTOMY  03/2006    There were no vitals filed for this visit.   Subjective Assessment - 06/09/21 1014     Subjective Pt states she is feeling okay with no new issues. Agreeable to test functional outcomes for progress note today.    Pertinent History Patient is a pleasant 61 year old female who is returning to PT after MS exacerbation and hospitalization for COVID. Patient went to SNF for one month and then received home health PT for a month (8 visits).  PMH includes HTN, SVT, neurogenic bowel, MS (1995), neuropathy, hx of wrist fx, HPV, Hypercholesteremia, adiposity, osteopenia, spasticity. She wears bilateral AFOs and uses a RW and/or wheelchair for mobility. She drives an adapted car.    Currently in Pain? No/denies           Interventions:  Reassessed Long term  goals for progress note.   FOTO= 36 %  (decreased from last reported 45 on 05/05/2021)  5 time Sit to Stand= 27.72 sec (improved from last measured 30.0 sec on 11/8)  10 MWT= 40.7 sec (0.25 sec)  (improved from 40.8 sec)  TUG: 46.45 sec with use of RW (slight decrease from 44.3 sec)                                 PT Education - 06/10/21 1015     Education provided Yes    Education Details Purpose of functional outcomes in relation to her overall progress    Person(s) Educated Patient    Methods Explanation;Demonstration;Tactile cues;Verbal cues    Comprehension Verbalized understanding;Returned demonstration;Verbal cues required;Tactile  cues required;Need further instruction              PT Short Term Goals - 05/05/21 1253       PT SHORT TERM GOAL #1   Title Patient will be independent in home exercise program to improve strength/mobility for better functional independence with ADLs.    Baseline 10/4 HEP provided 11/8: HEP compliant    Time 4    Period Weeks    Status Achieved    Target Date 03/08/21               PT Long Term Goals - 06/10/21 1129       PT LONG TERM GOAL #1   Title Patient will increase FOTO score to equal to or greater than  51/100   to demonstrate statistically significant improvement in mobility and quality of life.    Baseline 10/4: 42% 11/8: 36% 12/29: 45%; 06/09/2021= 36%    Time 12    Period Weeks    Status On-going    Target Date 07/28/21      PT LONG TERM GOAL #2   Title Patient (< 16 years old) will complete five times sit to stand test in < 20 seconds indicating an increased LE strength and improved balance.    Baseline 10/4: 33.15 seconds with heavy BUE support 11/8: 30 seconds; 12/29: deferred to future session. Pt attempted today but unable to complete d/t increase in R side LBP; 06/09/2021= 27.72 sec with UE support    Time 12    Period Weeks    Status Partially Met    Target Date 07/28/21      PT LONG TERM GOAL #3   Title Patient will increase 10 meter walk test to <20 seconds as to improve gait speed for better community ambulation and to reduce fall risk.    Baseline 10/4: 43.17 seconds with RW 11/8: 40.8 seconds; 43 sec (0.23 m/s) with RW; 06/09/2021= 40.7 sec with RW    Time 12    Period Weeks    Status Partially Met    Target Date 07/28/21      PT LONG TERM GOAL #4   Title Patient will reduce timed up and go to <11 seconds to reduce fall risk and demonstrate improved transfer/gait ability.    Baseline 10/4: 52.66 seconds with RW 11/8: 49 seconds with RW; 12/29: 44.3 sec; 06/09/2021= 46.45 sec with use of RW    Time 12    Period Weeks    Status Partially Met     Target Date 07/28/21                   Plan - 06/09/21  1127     Clinical Impression Statement Patient presents with excellent motivation for today's session. All Long term goals were reassessed with rest breaks in between. Patient performed several outcome measures with some improvement but a couple of slight decline including TUG - which could be related to fatigue as this test was performed last. She was receptive to [facility] for gait sequencing and presented with improved overall 10 meter walk test - reciprocal steps and also demonstrated one of her best timed 5x STS. Patient's condition has the potential to improve in response to therapy. Maximum improvement is yet to be obtained. The anticipated improvement is attainable and reasonable in a generally predictable time. The pt will benefit from further skilled PT to improve mobility, strength, pain, gait and balance in order to decrease fall risk and increase QOL    Personal Factors and Comorbidities Age;Comorbidity 3+;Finances;Fitness;Past/Current Experience;Sex;Social Background;Time since onset of injury/illness/exacerbation;Transportation    Comorbidities HTN, SVT, neurogenic bowel, MS (1995), neuropathy, hx of wrist fx, HPV, Hypercholesteremia, adiposity, osteopenia, spasticity.    Examination-Activity Limitations Bathing;Bed Mobility;Bend;Carry;Continence;Dressing;Hygiene/Grooming;Stairs;Squat;Reach Overhead;Locomotion Level;Lift;Stand;Transfers;Toileting    Examination-Participation Restrictions Church;Cleaning;Community Activity;Laundry;Volunteer;Shop;Meal Prep;Yard Work    [employer]chant [employer] officer Evolving/Moderate complexity    Rehab Potential Good    PT Frequency 2x / week    PT Duration 12 weeks    PT Treatment/Interventions ADLs/Self Care Home Management;Aquatic Therapy;Biofeedback;Cryotherapy;Electrical Stimulation;Iontophoresis 37m/ml Dexamethasone;Traction;Moist Heat;Ultrasound;DME Instruction;Gait training;Stair  training;Functional mobility training;Therapeutic activities;Therapeutic exercise;Balance training;Neuromuscular re-education;Patient/family education;Orthotic Fit/Training;Wheelchair mobility training;Manual techniques;Compression bandaging;Passive range of motion;Dry needling;Energy conservation;Taping;Splinting;Vestibular    PT Next Visit Plan in // bars weight shift and LE strengthening    PT Home Exercise Plan No changes    Consulted and Agree with Plan of Care Patient             Patient will benefit from skilled therapeutic intervention in order to improve the following deficits and impairments:  Abnormal gait, Decreased activity tolerance, Decreased balance, Decreased coordination, Decreased endurance, Decreased mobility, Decreased range of motion, Decreased strength, Difficulty walking, Impaired perceived functional ability, Impaired sensation, Impaired tone, Impaired UE functional use, Improper body mechanics, Postural dysfunction, Cardiopulmonary status limiting activity, Impaired flexibility, Increased muscle spasms  Visit Diagnosis: Other abnormalities of gait and mobility  Difficulty in walking, not elsewhere classified  Muscle weakness (generalized)  Unsteadiness on feet     Problem List Patient Active Problem List   Diagnosis Date Noted   Acute respiratory disease due to COVID-19 virus 11/21/2020   Weakness    Hypoalbuminemia due to protein-calorie malnutrition (HCC)    Neurogenic bowel    Neurogenic bladder    Labile blood pressure    Neuropathic pain    Abscess of female pelvis    SVT (supraventricular tachycardia) (HCC)    Radial styloid tenosynovitis 03/12/2018   Wheelchair confinement 02/27/2018   Localized osteoporosis with current pathological fracture with routine healing 01/19/2017   Wrist fracture 01/16/2017   Sprain of ankle 03/23/2016   Closed fracture of lateral malleolus 03/16/2016   Health care maintenance 01/24/2016   Blood pressure elevated  without history of HTN 10/25/2015   Essential hypertension 10/25/2015   Multiple sclerosis ([place] 10/02/2015   Chronic left shoulder pain 07/19/2015   Multiple sclerosis exacerbation ([place] 07/14/2015   MS (multiple sclerosis) ([place] [place] 11/26/2014   Increased body mass index 11/26/2014   HPV test positive 11/26/2014   Status post laparoscopic supracervical hysterectomy 11/26/2014   Galactorrhea 11/26/2014   Back ache 05/21/2014   Adiposity 05/21/2014   Disordered sleep 05/21/2014   Muscle spasticity  05/21/2014   Spasticity 05/21/2014   Calculus of kidney 84/83/5075   Renal colic 73/22/5672   Hypercholesteremia 08/19/2013   Hereditary and idiopathic neuropathy 08/19/2013   Hypercholesterolemia without hypertriglyceridemia 08/19/2013   Bladder infection, chronic 07/25/2012   Disorder of bladder function 07/25/2012   Incomplete bladder emptying 07/25/2012   Microscopic hematuria 07/25/2012   Right upper quadrant pain 07/25/2012    Lewis Moccasin, PT 06/10/2021, 11:41 AM  [place] MAIN [facility] SERVICES [place], [place], [address] Phone: [phone]   Fax:  [phone]  Name: Lisa Williams MRN: [id number] Date of Birth: 05-17-1960

## 2021-06-14 ENCOUNTER — Ambulatory Visit: Payer: PPO

## 2021-06-14 ENCOUNTER — Other Ambulatory Visit: Payer: Self-pay

## 2021-06-14 DIAGNOSIS — M6281 Muscle weakness (generalized): Secondary | ICD-10-CM

## 2021-06-14 DIAGNOSIS — R262 Difficulty in walking, not elsewhere classified: Secondary | ICD-10-CM

## 2021-06-14 DIAGNOSIS — R2689 Other abnormalities of gait and mobility: Secondary | ICD-10-CM | POA: Diagnosis not present

## 2021-06-14 DIAGNOSIS — R2681 Unsteadiness on feet: Secondary | ICD-10-CM

## 2021-06-14 NOTE — Therapy (Signed)
[facility] SERVICES [place], [place], [address] Phone: [phone]   Fax:  [phone]  Physical Therapy Treatment  Patient Details  Name: Lisa Williams MRN: [id number] Date of Birth: Nov 07, 1960 Referring Provider (PT): Margette Fast MD   Encounter Date: 06/14/2021   PT End of Session - 06/14/21 1011     Visit Number 31    Number of Visits 44    Date for PT Re-Evaluation 07/28/21    Authorization Type 1/10 pn 2/2    PT Start Time 1016    PT Stop Time 1059    PT Time Calculation (min) 43 min    Equipment Utilized During Treatment Gait belt    Activity Tolerance Patient tolerated treatment well;Patient limited by fatigue    Behavior During Therapy [facility] for tasks assessed/performed             Past Medical History:  Diagnosis Date   Abdominal pain, right upper quadrant    Back pain    Calculus of kidney 12/09/2013   Chronic back pain    unspecified   Chronic left shoulder pain 08/24/6220   Complication of anesthesia    Functional disorder of bladder    other   Galactorrhea 11/26/2014   Chronic    Hereditary and idiopathic neuropathy 08/19/2013   HPV test positive    Hypercholesteremia 08/19/2013   Hypertension    Incomplete bladder emptying    Microscopic hematuria    MS (multiple sclerosis) (HCC)    Muscle spasticity 05/21/2014   Nonspecific findings on examination of urine    other   Osteopenia    PONV (postoperative nausea and vomiting)    Status post laparoscopic supracervical hysterectomy 11/26/2014   Tobacco user 11/26/2014   Wrist fracture     Past Surgical History:  Procedure Laterality Date   bilateral tubal ligation  1996   BREAST CYST EXCISION Left 2002   FRACTURE SURGERY     KNEE SURGERY     right   LAPAROSCOPIC SUPRACERVICAL HYSTERECTOMY  08/05/2013   ORIF WRIST FRACTURE Left 01/17/2017   Procedure: OPEN REDUCTION INTERNAL FIXATION (ORIF) WRIST FRACTURE;  Surgeon: Lovell Sheehan, MD;  Location: [facility];  Service: Orthopedics;  Laterality: Left;   RADIOLOGY WITH ANESTHESIA N/A 03/18/2020   Procedure: MRI WITH ANESTHESIA CERVICAL SPINE AND BRAIN  WITH AND WITHOUT CONTRAST;  Surgeon: Radiologist, Medication, MD;  Location: [place];  Service: Radiology;  Laterality: N/A;   TUBAL LIGATION Bilateral    VAGINAL HYSTERECTOMY  03/2006    There were no vitals filed for this visit.   Subjective Assessment - 06/14/21 1251     Subjective Patient reports no falls or LOB since last session. Has her ramp installed in the house now.    Pertinent History Patient is a pleasant 61 year old female who is returning to PT after MS exacerbation and hospitalization for COVID. Patient went to SNF for one month and then received home health PT for a month (8 visits).  PMH includes HTN, SVT, neurogenic bowel, MS (1995), neuropathy, hx of wrist fx, HPV, Hypercholesteremia, adiposity, osteopenia, spasticity. She wears bilateral AFOs and uses a RW and/or wheelchair for mobility. She drives an adapted car.    Limitations Lifting;Standing;Walking;House hold activities;Sitting    How long can you sit comfortably? n/a    How long can you stand comfortably? 2 minutes    How long can you walk comfortably? 40 ft in house  Currently in Pain? No/denies                  INTERVENTIONS   Standing in // bars: standing weight shift 20x each direction, VC to slow movement RTB hip flexion into band across // bars 8x each LE  PVC pipe: -chest press 10x -witches brew 10x clockwise, 10x counterclockwise -alternating hand pass 10x    Seated: Sit on yellow dynadisc, narrow BOS:  -static sit 30 seconds -lateral trunk lean 10x each side -anterior/posterior trunk lean 10x  -trunk rotation 10x each side   Modified windmills 12x each side   RTB abduction 10x each LE RTB adduction 10x each LE  Isometric hamstring curl using yellow dynadisc, 2x10 BLE    Ice pack donned throughout  session to minimize exacerbation of MS symptoms in order to perform interventions.    Pt educated throughout session about proper posture and technique with exercises. Improved exercise technique, movement at target joints, use of target muscles after min to mod verbal, visual, tactile cues.   Patient tolerates increased duration of standing interventions this session. Improved stability with ability to reach and pass objects performed in standing this session. Core stabilization while seated on dynadisc is improving with decreased need for UE support. The pt will benefit from further skilled PT to improve mobility, strength, pain, gait and balance in order to decrease fall risk and increase QOL                   PT Education - 06/14/21 1011     Education provided Yes    Education Details exercise technique, body mechanics    Person(s) Educated Patient    Methods Explanation;Demonstration;Tactile cues;Verbal cues    Comprehension Verbalized understanding;Returned demonstration;Verbal cues required;Tactile cues required              PT Short Term Goals - 05/05/21 1253       PT SHORT TERM GOAL #1   Title Patient will be independent in home exercise program to improve strength/mobility for better functional independence with ADLs.    Baseline 10/4 HEP provided 11/8: HEP compliant    Time 4    Period Weeks    Status Achieved    Target Date 03/08/21               PT Long Term Goals - 06/10/21 1129       PT LONG TERM GOAL #1   Title Patient will increase FOTO score to equal to or greater than  51/100   to demonstrate statistically significant improvement in mobility and quality of life.    Baseline 10/4: 42% 11/8: 36% 12/29: 45%; 06/09/2021= 36%    Time 12    Period Weeks    Status On-going    Target Date 07/28/21      PT LONG TERM GOAL #2   Title Patient (< 76 years old) will complete five times sit to stand test in < 20 seconds indicating an increased LE  strength and improved balance.    Baseline 10/4: 33.15 seconds with heavy BUE support 11/8: 30 seconds; 12/29: deferred to future session. Pt attempted today but unable to complete d/t increase in R side LBP; 06/09/2021= 27.72 sec with UE support    Time 12    Period Weeks    Status Partially Met    Target Date 07/28/21      PT LONG TERM GOAL #3   Title Patient will increase 10 meter walk test to <  20 seconds as to improve gait speed for better community ambulation and to reduce fall risk.    Baseline 10/4: 43.17 seconds with RW 11/8: 40.8 seconds; 43 sec (0.23 m/s) with RW; 06/09/2021= 40.7 sec with RW    Time 12    Period Weeks    Status Partially Met    Target Date 07/28/21      PT LONG TERM GOAL #4   Title Patient will reduce timed up and go to <11 seconds to reduce fall risk and demonstrate improved transfer/gait ability.    Baseline 10/4: 52.66 seconds with RW 11/8: 49 seconds with RW; 12/29: 44.3 sec; 06/09/2021= 46.45 sec with use of RW    Time 12    Period Weeks    Status Partially Met    Target Date 07/28/21                   Plan - 06/14/21 1254     Clinical Impression Statement Patient tolerates increased duration of standing interventions this session. Improved stability with ability to reach and pass objects performed in standing this session. Core stabilization while seated on dynadisc is improving with decreased need for UE support. The pt will benefit from further skilled PT to improve mobility, strength, pain, gait and balance in order to decrease fall risk and increase QOL    Personal Factors and Comorbidities Age;Comorbidity 3+;Finances;Fitness;Past/Current Experience;Sex;Social Background;Time since onset of injury/illness/exacerbation;Transportation    Comorbidities HTN, SVT, neurogenic bowel, MS (1995), neuropathy, hx of wrist fx, HPV, Hypercholesteremia, adiposity, osteopenia, spasticity.    Examination-Activity Limitations Bathing;Bed  Mobility;Bend;Carry;Continence;Dressing;Hygiene/Grooming;Stairs;Squat;Reach Overhead;Locomotion Level;Lift;Stand;Transfers;Toileting    Examination-Participation Restrictions Church;Cleaning;Community Activity;Laundry;Volunteer;Shop;Meal Prep;Yard Work    [employer]chant [employer] officer Evolving/Moderate complexity    Rehab Potential Good    PT Frequency 2x / week    PT Duration 12 weeks    PT Treatment/Interventions ADLs/Self Care Home Management;Aquatic Therapy;Biofeedback;Cryotherapy;Electrical Stimulation;Iontophoresis 58m/ml Dexamethasone;Traction;Moist Heat;Ultrasound;DME Instruction;Gait training;Stair training;Functional mobility training;Therapeutic activities;Therapeutic exercise;Balance training;Neuromuscular re-education;Patient/family education;Orthotic Fit/Training;Wheelchair mobility training;Manual techniques;Compression bandaging;Passive range of motion;Dry needling;Energy conservation;Taping;Splinting;Vestibular    PT Next Visit Plan in // bars weight shift and LE strengthening    PT Home Exercise Plan No changes    Consulted and Agree with Plan of Care Patient             Patient will benefit from skilled therapeutic intervention in order to improve the following deficits and impairments:  Abnormal gait, Decreased activity tolerance, Decreased balance, Decreased coordination, Decreased endurance, Decreased mobility, Decreased range of motion, Decreased strength, Difficulty walking, Impaired perceived functional ability, Impaired sensation, Impaired tone, Impaired UE functional use, Improper body mechanics, Postural dysfunction, Cardiopulmonary status limiting activity, Impaired flexibility, Increased muscle spasms  Visit Diagnosis: Other abnormalities of gait and mobility  Difficulty in walking, not elsewhere classified  Muscle weakness (generalized)  Unsteadiness on feet     Problem List Patient Active Problem List   Diagnosis Date Noted   Acute respiratory  disease due to COVID-19 virus 11/21/2020   Weakness    Hypoalbuminemia due to protein-calorie malnutrition (HCC)    Neurogenic bowel    Neurogenic bladder    Labile blood pressure    Neuropathic pain    Abscess of female pelvis    SVT (supraventricular tachycardia) (HCC)    Radial styloid tenosynovitis 03/12/2018   Wheelchair confinement 02/27/2018   Localized osteoporosis with current pathological fracture with routine healing 01/19/2017   Wrist fracture 01/16/2017   Sprain of ankle 03/23/2016   Closed fracture of lateral malleolus 03/16/2016  Health care maintenance 01/24/2016   Blood pressure elevated without history of HTN 10/25/2015   Essential hypertension 10/25/2015   Multiple sclerosis ([place]) 10/02/2015   Chronic left shoulder pain 07/19/2015   Multiple sclerosis exacerbation ([place]) 07/14/2015   MS (multiple sclerosis) ([place]) 11/26/2014   Increased body mass index 11/26/2014   HPV test positive 11/26/2014   Status post laparoscopic supracervical hysterectomy 11/26/2014   Galactorrhea 11/26/2014   Back ache 05/21/2014   Adiposity 05/21/2014   Disordered sleep 05/21/2014   Muscle spasticity 05/21/2014   Spasticity 05/21/2014   Calculus of kidney 51/46/0479   Renal colic 98/72/1587   Hypercholesteremia 08/19/2013   Hereditary and idiopathic neuropathy 08/19/2013   Hypercholesterolemia without hypertriglyceridemia 08/19/2013   Bladder infection, chronic 07/25/2012   Disorder of bladder function 07/25/2012   Incomplete bladder emptying 07/25/2012   Microscopic hematuria 07/25/2012   Right upper quadrant pain 07/25/2012    Janna Arch, PT, DPT  06/14/2021, 12:55 PM  [place] [facility] [address] [place], [place], [address] Phone: [phone]   Fax:  [phone]  Name: Lisa Williams MRN: [id number] Date of Birth: 04/20/1961

## 2021-06-16 ENCOUNTER — Ambulatory Visit: Payer: PPO

## 2021-06-16 ENCOUNTER — Other Ambulatory Visit: Payer: Self-pay

## 2021-06-16 DIAGNOSIS — R2681 Unsteadiness on feet: Secondary | ICD-10-CM

## 2021-06-16 DIAGNOSIS — M6281 Muscle weakness (generalized): Secondary | ICD-10-CM

## 2021-06-16 DIAGNOSIS — R2689 Other abnormalities of gait and mobility: Secondary | ICD-10-CM

## 2021-06-16 DIAGNOSIS — R262 Difficulty in walking, not elsewhere classified: Secondary | ICD-10-CM

## 2021-06-16 NOTE — Therapy (Signed)
[place] [facility] SERVICES [place], [place], [address] Phone: [phone]   Fax:  [phone]  Physical Therapy Treatment  Patient Details  Name: Lisa Williams MRN: [id number] Date of Birth: 26-Jul-1960 Referring Provider (PT): Margette Fast MD   Encounter Date: 06/16/2021   PT End of Session - 06/16/21 1216     Visit Number 32    Number of Visits 44    Date for PT Re-Evaluation 07/28/21    Authorization Type 2/10 pn 2/2    PT Start Time 1015    PT Stop Time 1059    PT Time Calculation (min) 44 min    Equipment Utilized During Treatment Gait belt    Activity Tolerance Patient tolerated treatment well;Patient limited by fatigue    Behavior During Therapy [facility] for tasks assessed/performed             Past Medical History:  Diagnosis Date   Abdominal pain, right upper quadrant    Back pain    Calculus of kidney 12/09/2013   Chronic back pain    unspecified   Chronic left shoulder pain 6/54/6503   Complication of anesthesia    Functional disorder of bladder    other   Galactorrhea 11/26/2014   Chronic    Hereditary and idiopathic neuropathy 08/19/2013   HPV test positive    Hypercholesteremia 08/19/2013   Hypertension    Incomplete bladder emptying    Microscopic hematuria    MS (multiple sclerosis) (HCC)    Muscle spasticity 05/21/2014   Nonspecific findings on examination of urine    other   Osteopenia    PONV (postoperative nausea and vomiting)    Status post laparoscopic supracervical hysterectomy 11/26/2014   Tobacco user 11/26/2014   Wrist fracture     Past Surgical History:  Procedure Laterality Date   bilateral tubal ligation  1996   BREAST CYST EXCISION Left 2002   FRACTURE SURGERY     KNEE SURGERY     right   LAPAROSCOPIC SUPRACERVICAL HYSTERECTOMY  08/05/2013   ORIF WRIST FRACTURE Left 01/17/2017   Procedure: OPEN REDUCTION INTERNAL FIXATION (ORIF) WRIST FRACTURE;  Surgeon: Lovell Sheehan, MD;  Location: [facility];  Service: Orthopedics;  Laterality: Left;   RADIOLOGY WITH ANESTHESIA N/A 03/18/2020   Procedure: MRI WITH ANESTHESIA CERVICAL SPINE AND BRAIN  WITH AND WITHOUT CONTRAST;  Surgeon: Radiologist, Medication, MD;  Location: [place];  Service: Radiology;  Laterality: N/A;   TUBAL LIGATION Bilateral    VAGINAL HYSTERECTOMY  03/2006    There were no vitals filed for this visit.   Subjective Assessment - 06/16/21 1214     Subjective Patient had her hair done since last session. No falls or LOB. Has been compliant with HEP.    Pertinent History Patient is a pleasant 61 year old female who is returning to PT after MS exacerbation and hospitalization for COVID. Patient went to SNF for one month and then received home health PT for a month (8 visits).  PMH includes HTN, SVT, neurogenic bowel, MS (1995), neuropathy, hx of wrist fx, HPV, Hypercholesteremia, adiposity, osteopenia, spasticity. She wears bilateral AFOs and uses a RW and/or wheelchair for mobility. She drives an adapted car.    Limitations Lifting;Standing;Walking;House hold activities;Sitting    How long can you sit comfortably? n/a    How long can you stand comfortably? 2 minutes    How long can you walk comfortably? 40 ft in house  Currently in Pain? No/denies                Standing/walking with RW -walking with bRW, CGA and wheelchair follow, use of ice. Total of 164f, 1 rest break. Patient has decreased foot clearance bilaterally, R>L as she fatigues. Heavy BUE support required as patient fatigued.      Seated:    Weighted ball (3000 gr) -chest press 10x -overhead raise 10x -pass with PT while in upright position 10x -rainbow raises 10x each side -woodchop 10x each side  GTB abduction 10x each LE GTB adduction 10x each LE GTB row 10x  Windmill 8x each direction modified Modified forward/backwards wheelchair crunches Sit to stand 4x; intermittent rest break between stands.       Ice pack donned throughout session to minimize exacerbation of MS symptoms in order to perform interventions    Patient is able to perform loop around OT and PT gym with only one rest break for first time since returning to physical therapy. Patient is highly motivated throughout physical therapy session. She is able to tolerate increased resistance with weighted ball and theraband. The pt will benefit from further skilled PT to improve mobility, strength, pain, gait and balance in order to decrease fall risk and increase QOL                    PT Education - 06/16/21 1215     Education provided Yes    Education Details exercise technique, body mechanics    Person(s) Educated Patient    Methods Explanation;Demonstration;Tactile cues;Verbal cues    Comprehension Verbalized understanding;Returned demonstration;Verbal cues required;Tactile cues required              PT Short Term Goals - 05/05/21 1253       PT SHORT TERM GOAL #1   Title Patient will be independent in home exercise program to improve strength/mobility for better functional independence with ADLs.    Baseline 10/4 HEP provided 11/8: HEP compliant    Time 4    Period Weeks    Status Achieved    Target Date 03/08/21               PT Long Term Goals - 06/10/21 1129       PT LONG TERM GOAL #1   Title Patient will increase FOTO score to equal to or greater than  51/100   to demonstrate statistically significant improvement in mobility and quality of life.    Baseline 10/4: 42% 11/8: 36% 12/29: 45%; 06/09/2021= 36%    Time 12    Period Weeks    Status On-going    Target Date 07/28/21      PT LONG TERM GOAL #2   Title Patient (< 748years old) will complete five times sit to stand test in < 20 seconds indicating an increased LE strength and improved balance.    Baseline 10/4: 33.15 seconds with heavy BUE support 11/8: 30 seconds; 12/29: deferred to future session. Pt attempted today but unable  to complete d/t increase in R side LBP; 06/09/2021= 27.72 sec with UE support    Time 12    Period Weeks    Status Partially Met    Target Date 07/28/21      PT LONG TERM GOAL #3   Title Patient will increase 10 meter walk test to <20 seconds as to improve gait speed for better community ambulation and to reduce fall risk.    Baseline 10/4: 43.17  seconds with RW 11/8: 40.8 seconds; 43 sec (0.23 m/s) with RW; 06/09/2021= 40.7 sec with RW    Time 12    Period Weeks    Status Partially Met    Target Date 07/28/21      PT LONG TERM GOAL #4   Title Patient will reduce timed up and go to <11 seconds to reduce fall risk and demonstrate improved transfer/gait ability.    Baseline 10/4: 52.66 seconds with RW 11/8: 49 seconds with RW; 12/29: 44.3 sec; 06/09/2021= 46.45 sec with use of RW    Time 12    Period Weeks    Status Partially Met    Target Date 07/28/21                   Plan - 06/16/21 1220     Clinical Impression Statement Patient is able to perform loop around OT and PT gym with only one rest break for first time since returning to physical therapy. Patient is highly motivated throughout physical therapy session. She is able to tolerate increased resistance with weighted ball and theraband. The pt will benefit from further skilled PT to improve mobility, strength, pain, gait and balance in order to decrease fall risk and increase QOL    Personal Factors and Comorbidities Age;Comorbidity 3+;Finances;Fitness;Past/Current Experience;Sex;Social Background;Time since onset of injury/illness/exacerbation;Transportation    Comorbidities HTN, SVT, neurogenic bowel, MS (1995), neuropathy, hx of wrist fx, HPV, Hypercholesteremia, adiposity, osteopenia, spasticity.    Examination-Activity Limitations Bathing;Bed Mobility;Bend;Carry;Continence;Dressing;Hygiene/Grooming;Stairs;Squat;Reach Overhead;Locomotion Level;Lift;Stand;Transfers;Toileting    Examination-Participation Restrictions  Church;Cleaning;Community Activity;Laundry;Volunteer;Shop;Meal Prep;Yard Work    Merchant [employer] officer Evolving/Moderate complexity    Rehab Potential Good    PT Frequency 2x / week    PT Duration 12 weeks    PT Treatment/Interventions ADLs/Self Care Home Management;Aquatic Therapy;Biofeedback;Cryotherapy;Electrical Stimulation;Iontophoresis 36m/ml Dexamethasone;Traction;Moist Heat;Ultrasound;DME Instruction;Gait training;Stair training;Functional mobility training;Therapeutic activities;Therapeutic exercise;Balance training;Neuromuscular re-education;Patient/family education;Orthotic Fit/Training;Wheelchair mobility training;Manual techniques;Compression bandaging;Passive range of motion;Dry needling;Energy conservation;Taping;Splinting;Vestibular    PT Next Visit Plan in // bars weight shift and LE strengthening    PT Home Exercise Plan No changes    Consulted and Agree with Plan of Care Patient             Patient will benefit from skilled therapeutic intervention in order to improve the following deficits and impairments:  Abnormal gait, Decreased activity tolerance, Decreased balance, Decreased coordination, Decreased endurance, Decreased mobility, Decreased range of motion, Decreased strength, Difficulty walking, Impaired perceived functional ability, Impaired sensation, Impaired tone, Impaired UE functional use, Improper body mechanics, Postural dysfunction, Cardiopulmonary status limiting activity, Impaired flexibility, Increased muscle spasms  Visit Diagnosis: Other abnormalities of gait and mobility  Difficulty in walking, not elsewhere classified  Muscle weakness (generalized)  Unsteadiness on feet     Problem List Patient Active Problem List   Diagnosis Date Noted   Acute respiratory disease due to COVID-19 virus 11/21/2020   Weakness    Hypoalbuminemia due to protein-calorie malnutrition (HCC)    Neurogenic bowel    Neurogenic bladder    Labile blood  pressure    Neuropathic pain    Abscess of female pelvis    SVT (supraventricular tachycardia) (HCC)    Radial styloid tenosynovitis 03/12/2018   Wheelchair confinement 02/27/2018   Localized osteoporosis with current pathological fracture with routine healing 01/19/2017   Wrist fracture 01/16/2017   Sprain of ankle 03/23/2016   Closed fracture of lateral malleolus 03/16/2016   Health care maintenance 01/24/2016   Blood pressure elevated without history of HTN 10/25/2015  Essential hypertension 10/25/2015   Multiple sclerosis ([place]) 10/02/2015   Chronic left shoulder pain 07/19/2015   Multiple sclerosis exacerbation ([place]) 07/14/2015   MS (multiple sclerosis) ([place]) 11/26/2014   Increased body mass index 11/26/2014   HPV test positive 11/26/2014   Status post laparoscopic supracervical hysterectomy 11/26/2014   Galactorrhea 11/26/2014   Back ache 05/21/2014   Adiposity 05/21/2014   Disordered sleep 05/21/2014   Muscle spasticity 05/21/2014   Spasticity 05/21/2014   Calculus of kidney 32/20/2542   Renal colic 70/62/3762   Hypercholesteremia 08/19/2013   Hereditary and idiopathic neuropathy 08/19/2013   Hypercholesterolemia without hypertriglyceridemia 08/19/2013   Bladder infection, chronic 07/25/2012   Disorder of bladder function 07/25/2012   Incomplete bladder emptying 07/25/2012   Microscopic hematuria 07/25/2012   Right upper quadrant pain 07/25/2012    Janna Arch, PT, DPT  06/16/2021, 12:21 PM  [place] [facility] SERVICES [address] [place], [place], [address] Phone: [phone]   Fax:  [phone]  Name: Lisa Williams MRN: [id number] Date of Birth: 1961-01-01

## 2021-06-21 ENCOUNTER — Ambulatory Visit: Payer: PPO

## 2021-06-21 ENCOUNTER — Other Ambulatory Visit: Payer: Self-pay

## 2021-06-21 DIAGNOSIS — R2689 Other abnormalities of gait and mobility: Secondary | ICD-10-CM | POA: Diagnosis not present

## 2021-06-21 DIAGNOSIS — R2681 Unsteadiness on feet: Secondary | ICD-10-CM

## 2021-06-21 DIAGNOSIS — R262 Difficulty in walking, not elsewhere classified: Secondary | ICD-10-CM

## 2021-06-21 DIAGNOSIS — M6281 Muscle weakness (generalized): Secondary | ICD-10-CM

## 2021-06-21 NOTE — Therapy (Signed)
[place] MAIN [facility] SERVICES [place], [place], [address] Phone: [phone]   Fax:  [phone]  Physical Therapy Treatment  Patient Details  Name: Lisa Williams MRN: [id number] Date of Birth: [birth year] Referring Provider (PT): Margette Fast MD   Encounter Date: 06/21/2021   PT End of Session - 06/21/21 1107     Visit Number 33    Number of Visits 44    Date for PT Re-Evaluation 07/28/21    Authorization Type 3/10 pn 2/2    PT Start Time 1015    PT Stop Time 1058    PT Time Calculation (min) 43 min    Equipment Utilized During Treatment Gait belt    Activity Tolerance Patient tolerated treatment well;Patient limited by fatigue    Behavior During Therapy [facility] for tasks assessed/performed             Past Medical History:  Diagnosis Date   Abdominal pain, right upper quadrant    Back pain    Calculus of kidney 12/09/2013   Chronic back pain    unspecified   Chronic left shoulder pain 0/01/3817   Complication of anesthesia    Functional disorder of bladder    other   Galactorrhea 11/26/2014   Chronic    Hereditary and idiopathic neuropathy 08/19/2013   HPV test positive    Hypercholesteremia 08/19/2013   Hypertension    Incomplete bladder emptying    Microscopic hematuria    MS (multiple sclerosis) (HCC)    Muscle spasticity 05/21/2014   Nonspecific findings on examination of urine    other   Osteopenia    PONV (postoperative nausea and vomiting)    Status post laparoscopic supracervical hysterectomy 11/26/2014   Tobacco user 11/26/2014   Wrist fracture     Past Surgical History:  Procedure Laterality Date   bilateral tubal ligation  1996   BREAST CYST EXCISION Left 2002   FRACTURE SURGERY     KNEE SURGERY     right   LAPAROSCOPIC SUPRACERVICAL HYSTERECTOMY  08/05/2013   ORIF WRIST FRACTURE Left 01/17/2017   Procedure: OPEN REDUCTION INTERNAL FIXATION (ORIF) WRIST FRACTURE;  Surgeon: Lovell Sheehan, MD;  Location: [facility];  Service: Orthopedics;  Laterality: Left;   RADIOLOGY WITH ANESTHESIA N/A 03/18/2020   Procedure: MRI WITH ANESTHESIA CERVICAL SPINE AND BRAIN  WITH AND WITHOUT CONTRAST;  Surgeon: Radiologist, Medication, MD;  Location: [place];  Service: Radiology;  Laterality: N/A;   TUBAL LIGATION Bilateral    VAGINAL HYSTERECTOMY  03/2006    There were no vitals filed for this visit.   Subjective Assessment - 06/21/21 1106     Subjective Patient went to the dentist yesterday and had some complications. No falls or LOB since last session.    Pertinent History Patient is a pleasant 61 year old female who is returning to PT after MS exacerbation and hospitalization for COVID. Patient went to SNF for one month and then received home health PT for a month (8 visits).  PMH includes HTN, SVT, neurogenic bowel, MS (1995), neuropathy, hx of wrist fx, HPV, Hypercholesteremia, adiposity, osteopenia, spasticity. She wears bilateral AFOs and uses a RW and/or wheelchair for mobility. She drives an adapted car.    Limitations Lifting;Standing;Walking;House hold activities;Sitting    How long can you sit comfortably? n/a    How long can you stand comfortably? 2 minutes    How long can you walk comfortably? 40 ft in house  Currently in Pain? No/denies                  Standing/walking with RW -walking with bRW, CGA and wheelchair follow, use of ice. Total of 159f, 1 rest break. Patient has decreased foot clearance bilaterally, R>L as she fatigues. Heavy BUE support required as patient fatigued. Patient ambulates 100 ft prior to need for first rest break     Seated:   Seated on dynadisc:   -static sit arms crossed 30 seconds -anterior posterior trunk lean 10x each direction -medial lateral trunk lean outside BOS 10x each LE -modified windmills 10x each side  GTB row 10x Isometric hip flexion 10x 5 second holds each LE Hamstring lengthening stretch 10x 5 seconds each  LE     Ice pack donned throughout session to minimize exacerbation of MS symptoms in order to perform interventions    Pt educated throughout session about proper posture and technique with exercises. Improved exercise technique, movement at target joints, use of target muscles after min to mod verbal, visual, tactile cues.  Patient ambulated for longest duration prior to need for seated rest break since returning to physical therapy. Patient is highly motivated throughout session and is fatigued by end. Core stability is challenged by dynadisc indicating area for continued focus. The pt will benefit from further skilled PT to improve mobility, strength, pain, gait and balance in order to decrease fall risk and increase QOL                    PT Education - 06/21/21 1107     Education provided Yes    Education Details exercise technique, body mechanics    Person(s) Educated Patient    Methods Explanation;Demonstration;Tactile cues;Verbal cues    Comprehension Verbalized understanding;Returned demonstration;Verbal cues required;Tactile cues required              PT Short Term Goals - 05/05/21 1253       PT SHORT TERM GOAL #1   Title Patient will be independent in home exercise program to improve strength/mobility for better functional independence with ADLs.    Baseline 10/4 HEP provided 11/8: HEP compliant    Time 4    Period Weeks    Status Achieved    Target Date 03/08/21               PT Long Term Goals - 06/10/21 1129       PT LONG TERM GOAL #1   Title Patient will increase FOTO score to equal to or greater than  51/100   to demonstrate statistically significant improvement in mobility and quality of life.    Baseline 10/4: 42% 11/8: 36% 12/29: 45%; 06/09/2021= 36%    Time 12    Period Weeks    Status On-going    Target Date 07/28/21      PT LONG TERM GOAL #2   Title Patient (< 667years old) will complete five times sit to stand test in < 20  seconds indicating an increased LE strength and improved balance.    Baseline 10/4: 33.15 seconds with heavy BUE support 11/8: 30 seconds; 12/29: deferred to future session. Pt attempted today but unable to complete d/t increase in R side LBP; 06/09/2021= 27.72 sec with UE support    Time 12    Period Weeks    Status Partially Met    Target Date 07/28/21      PT LONG TERM GOAL #3   Title Patient  will increase 10 meter walk test to <20 seconds as to improve gait speed for better community ambulation and to reduce fall risk.    Baseline 10/4: 43.17 seconds with RW 11/8: 40.8 seconds; 43 sec (0.23 m/s) with RW; 06/09/2021= 40.7 sec with RW    Time 12    Period Weeks    Status Partially Met    Target Date 07/28/21      PT LONG TERM GOAL #4   Title Patient will reduce timed up and go to <11 seconds to reduce fall risk and demonstrate improved transfer/gait ability.    Baseline 10/4: 52.66 seconds with RW 11/8: 49 seconds with RW; 12/29: 44.3 sec; 06/09/2021= 46.45 sec with use of RW    Time 12    Period Weeks    Status Partially Met    Target Date 07/28/21                   Plan - 06/21/21 1206     Clinical Impression Statement Patient ambulated for longest duration prior to need for seated rest break since returning to physical therapy. Patient is highly motivated throughout session and is fatigued by end. Core stability is challenged by dynadisc indicating area for continued focus. The pt will benefit from further skilled PT to improve mobility, strength, pain, gait and balance in order to decrease fall risk and increase QOL    Personal Factors and Comorbidities Age;Comorbidity 3+;Finances;Fitness;Past/Current Experience;Sex;Social Background;Time since onset of injury/illness/exacerbation;Transportation    Comorbidities HTN, SVT, neurogenic bowel, MS (1995), neuropathy, hx of wrist fx, HPV, Hypercholesteremia, adiposity, osteopenia, spasticity.    Examination-Activity Limitations  Bathing;Bed Mobility;Bend;Carry;Continence;Dressing;Hygiene/Grooming;Stairs;Squat;Reach Overhead;Locomotion Level;Lift;Stand;Transfers;Toileting    Examination-Participation Restrictions Church;Cleaning;Community Activity;Laundry;Volunteer;Shop;Meal Prep;Yard Work    [employer]chant [employer] officer Evolving/Moderate complexity    Rehab Potential Good    PT Frequency 2x / week    PT Duration 12 weeks    PT Treatment/Interventions ADLs/Self Care Home Management;Aquatic Therapy;Biofeedback;Cryotherapy;Electrical Stimulation;Iontophoresis 37m/ml Dexamethasone;Traction;Moist Heat;Ultrasound;DME Instruction;Gait training;Stair training;Functional mobility training;Therapeutic activities;Therapeutic exercise;Balance training;Neuromuscular re-education;Patient/family education;Orthotic Fit/Training;Wheelchair mobility training;Manual techniques;Compression bandaging;Passive range of motion;Dry needling;Energy conservation;Taping;Splinting;Vestibular    PT Next Visit Plan in // bars weight shift and LE strengthening    PT Home Exercise Plan No changes    Consulted and Agree with Plan of Care Patient             Patient will benefit from skilled therapeutic intervention in order to improve the following deficits and impairments:  Abnormal gait, Decreased activity tolerance, Decreased balance, Decreased coordination, Decreased endurance, Decreased mobility, Decreased range of motion, Decreased strength, Difficulty walking, Impaired perceived functional ability, Impaired sensation, Impaired tone, Impaired UE functional use, Improper body mechanics, Postural dysfunction, Cardiopulmonary status limiting activity, Impaired flexibility, Increased muscle spasms  Visit Diagnosis: Other abnormalities of gait and mobility  Difficulty in walking, not elsewhere classified  Muscle weakness (generalized)  Unsteadiness on feet     Problem List Patient Active Problem List   Diagnosis Date Noted   Acute  respiratory disease due to COVID-19 virus 11/21/2020   Weakness    Hypoalbuminemia due to protein-calorie malnutrition (HCC)    Neurogenic bowel    Neurogenic bladder    Labile blood pressure    Neuropathic pain    Abscess of female pelvis    SVT (supraventricular tachycardia) (HCC)    Radial styloid tenosynovitis 03/12/2018   Wheelchair confinement 02/27/2018   Localized osteoporosis with current pathological fracture with routine healing 01/19/2017   Wrist fracture 01/16/2017   Sprain of ankle 03/23/2016  Closed fracture of lateral malleolus 03/16/2016   Health care maintenance 01/24/2016   Blood pressure elevated without history of HTN 10/25/2015   Essential hypertension 10/25/2015   Multiple sclerosis ([place]) 10/02/2015   Chronic left shoulder pain 07/19/2015   Multiple sclerosis exacerbation ([place]) 07/14/2015   MS (multiple sclerosis) ([place]) 11/26/2014   Increased body mass index 11/26/2014   HPV test positive 11/26/2014   Status post laparoscopic supracervical hysterectomy 11/26/2014   Galactorrhea 11/26/2014   Back ache 05/21/2014   Adiposity 05/21/2014   Disordered sleep 05/21/2014   Muscle spasticity 05/21/2014   Spasticity 05/21/2014   Calculus of kidney 50/75/7322   Renal colic 56/72/0919   Hypercholesteremia 08/19/2013   Hereditary and idiopathic neuropathy 08/19/2013   Hypercholesterolemia without hypertriglyceridemia 08/19/2013   Bladder infection, chronic 07/25/2012   Disorder of bladder function 07/25/2012   Incomplete bladder emptying 07/25/2012   Microscopic hematuria 07/25/2012   Right upper quadrant pain 07/25/2012    Janna Arch, PT, DPT  06/21/2021, 12:07 PM  [place] MAIN [facility] SERVICES [address] [place], [place], [address] Phone: [phone]   Fax:  [phone]  Name: Briannia Laba Shepperson MRN: [id number] Date of Birth: [birth year]

## 2021-06-23 ENCOUNTER — Other Ambulatory Visit: Payer: Self-pay

## 2021-06-23 ENCOUNTER — Ambulatory Visit: Payer: PPO

## 2021-06-23 DIAGNOSIS — R2689 Other abnormalities of gait and mobility: Secondary | ICD-10-CM | POA: Diagnosis not present

## 2021-06-23 DIAGNOSIS — R262 Difficulty in walking, not elsewhere classified: Secondary | ICD-10-CM

## 2021-06-23 DIAGNOSIS — M6281 Muscle weakness (generalized): Secondary | ICD-10-CM

## 2021-06-23 NOTE — Therapy (Signed)
[place] MAIN [facility] SERVICES [place], [place], [address] Phone: [phone]   Fax:  [phone]  Physical Therapy Treatment  Patient Details  Name: Lisa Williams MRN: [id number] Date of Birth: 1961-02-01 Referring Provider (PT): Margette Fast MD   Encounter Date: 06/23/2021   PT End of Session - 06/23/21 1208     Visit Number 34    Number of Visits 44    Date for PT Re-Evaluation 07/28/21    Authorization Type 4/10 pn 2/2    PT Start Time 1015    PT Stop Time 1059    PT Time Calculation (min) 44 min    Equipment Utilized During Treatment Gait belt    Activity Tolerance Patient tolerated treatment well;Patient limited by fatigue    Behavior During Therapy [facility] for tasks assessed/performed             Past Medical History:  Diagnosis Date   Abdominal pain, right upper quadrant    Back pain    Calculus of kidney 12/09/2013   Chronic back pain    unspecified   Chronic left shoulder pain 09/03/7679   Complication of anesthesia    Functional disorder of bladder    other   Galactorrhea 11/26/2014   Chronic    Hereditary and idiopathic neuropathy 08/19/2013   HPV test positive    Hypercholesteremia 08/19/2013   Hypertension    Incomplete bladder emptying    Microscopic hematuria    MS (multiple sclerosis) (HCC)    Muscle spasticity 05/21/2014   Nonspecific findings on examination of urine    other   Osteopenia    PONV (postoperative nausea and vomiting)    Status post laparoscopic supracervical hysterectomy 11/26/2014   Tobacco user 11/26/2014   Wrist fracture     Past Surgical History:  Procedure Laterality Date   bilateral tubal ligation  1996   BREAST CYST EXCISION Left 2002   FRACTURE SURGERY     KNEE SURGERY     right   LAPAROSCOPIC SUPRACERVICAL HYSTERECTOMY  08/05/2013   ORIF WRIST FRACTURE Left 01/17/2017   Procedure: OPEN REDUCTION INTERNAL FIXATION (ORIF) WRIST FRACTURE;  Surgeon: Lovell Sheehan, MD;  Location: [facility];  Service: Orthopedics;  Laterality: Left;   RADIOLOGY WITH ANESTHESIA N/A 03/18/2020   Procedure: MRI WITH ANESTHESIA CERVICAL SPINE AND BRAIN  WITH AND WITHOUT CONTRAST;  Surgeon: Radiologist, Medication, MD;  Location: [place];  Service: Radiology;  Laterality: N/A;   TUBAL LIGATION Bilateral    VAGINAL HYSTERECTOMY  03/2006    There were no vitals filed for this visit.   Subjective Assessment - 06/23/21 1207     Subjective Patient reports no falls or LOB since last session. No spasms today.    Pertinent History Patient is a pleasant 61 year old female who is returning to PT after MS exacerbation and hospitalization for COVID. Patient went to SNF for one month and then received home health PT for a month (8 visits).  PMH includes HTN, SVT, neurogenic bowel, MS (1995), neuropathy, hx of wrist fx, HPV, Hypercholesteremia, adiposity, osteopenia, spasticity. She wears bilateral AFOs and uses a RW and/or wheelchair for mobility. She drives an adapted car.    Limitations Lifting;Standing;Walking;House hold activities;Sitting    How long can you sit comfortably? n/a    How long can you stand comfortably? 2 minutes    How long can you walk comfortably? 40 ft in house    Currently in Pain? No/denies  INTERVENTIONS   Standing in // bars: standing weight shift 20x each direction, VC to slow movement Reach down and pick up cone on 6" step 4x; x2 trials occasional no UE support required.    PVC pipe: -chest press 10x -witches brew 10x clockwise, 10x counterclockwise -alternating hand pass 10x    Seated: 4lb dumbbells: -bicep curls standard grip 10x -hammer curls 10x bilateral UE -chest press 10x -bent over row 10x with cue for scapular control -external rotation 10x   Modified windmills 12x each side   GTB abduction 10x each LE GTB adduction 10x each LE   Hamstring lengthening on PT leg 10x 4 seconds each LE     Ice pack  donned throughout session to minimize exacerbation of MS symptoms in order to perform interventions.    Pt educated throughout session about proper posture and technique with exercises. Improved exercise technique, movement at target joints, use of target muscles after min to mod verbal, visual, tactile cues.     Patient tolerates prolonged standing interventions as well as seated strengthening interventions with increased weight dumbbell. Her stability in seated and standing is improving as well as her capacity for activity as she is able to perform more interventions with less rest breaks this session. The pt will benefit from further skilled PT to improve mobility, strength, pain, gait and balance in order to decrease fall risk and increase QOL                      PT Education - 06/23/21 1208     Education provided Yes    Education Details exercise technique, body mechanics    Person(s) Educated Patient    Methods Explanation;Demonstration;Tactile cues;Verbal cues    Comprehension Verbalized understanding;Returned demonstration;Verbal cues required;Tactile cues required              PT Short Term Goals - 05/05/21 1253       PT SHORT TERM GOAL #1   Title Patient will be independent in home exercise program to improve strength/mobility for better functional independence with ADLs.    Baseline 10/4 HEP provided 11/8: HEP compliant    Time 4    Period Weeks    Status Achieved    Target Date 03/08/21               PT Long Term Goals - 06/10/21 1129       PT LONG TERM GOAL #1   Title Patient will increase FOTO score to equal to or greater than  51/100   to demonstrate statistically significant improvement in mobility and quality of life.    Baseline 10/4: 42% 11/8: 36% 12/29: 45%; 06/09/2021= 36%    Time 12    Period Weeks    Status On-going    Target Date 07/28/21      PT LONG TERM GOAL #2   Title Patient (< 22 years old) will complete five times  sit to stand test in < 20 seconds indicating an increased LE strength and improved balance.    Baseline 10/4: 33.15 seconds with heavy BUE support 11/8: 30 seconds; 12/29: deferred to future session. Pt attempted today but unable to complete d/t increase in R side LBP; 06/09/2021= 27.72 sec with UE support    Time 12    Period Weeks    Status Partially Met    Target Date 07/28/21      PT LONG TERM GOAL #3   Title Patient will increase 10 meter  walk test to <20 seconds as to improve gait speed for better community ambulation and to reduce fall risk.    Baseline 10/4: 43.17 seconds with RW 11/8: 40.8 seconds; 43 sec (0.23 m/s) with RW; 06/09/2021= 40.7 sec with RW    Time 12    Period Weeks    Status Partially Met    Target Date 07/28/21      PT LONG TERM GOAL #4   Title Patient will reduce timed up and go to <11 seconds to reduce fall risk and demonstrate improved transfer/gait ability.    Baseline 10/4: 52.66 seconds with RW 11/8: 49 seconds with RW; 12/29: 44.3 sec; 06/09/2021= 46.45 sec with use of RW    Time 12    Period Weeks    Status Partially Met    Target Date 07/28/21                   Plan - 06/23/21 1211     Clinical Impression Statement Patient tolerates prolonged standing interventions as well as seated strengthening interventions with increased weight dumbbell. Her stability in seated and standing is improving as well as her capacity for activity as she is able to perform more interventions with less rest breaks this session. The pt will benefit from further skilled PT to improve mobility, strength, pain, gait and balance in order to decrease fall risk and increase QOL    Personal Factors and Comorbidities Age;Comorbidity 3+;Finances;Fitness;Past/Current Experience;Sex;Social Background;Time since onset of injury/illness/exacerbation;Transportation    Comorbidities HTN, SVT, neurogenic bowel, MS (1995), neuropathy, hx of wrist fx, HPV, Hypercholesteremia, adiposity,  osteopenia, spasticity.    Examination-Activity Limitations Bathing;Bed Mobility;Bend;Carry;Continence;Dressing;Hygiene/Grooming;Stairs;Squat;Reach Overhead;Locomotion Level;Lift;Stand;Transfers;Toileting    Examination-Participation Restrictions Church;Cleaning;Community Activity;Laundry;Volunteer;Shop;Meal Prep;Yard Work    [employer]chant [employer] officer Evolving/Moderate complexity    Rehab Potential Good    PT Frequency 2x / week    PT Duration 12 weeks    PT Treatment/Interventions ADLs/Self Care Home Management;Aquatic Therapy;Biofeedback;Cryotherapy;Electrical Stimulation;Iontophoresis 51m/ml Dexamethasone;Traction;Moist Heat;Ultrasound;DME Instruction;Gait training;Stair training;Functional mobility training;Therapeutic activities;Therapeutic exercise;Balance training;Neuromuscular re-education;Patient/family education;Orthotic Fit/Training;Wheelchair mobility training;Manual techniques;Compression bandaging;Passive range of motion;Dry needling;Energy conservation;Taping;Splinting;Vestibular    PT Next Visit Plan in // bars weight shift and LE strengthening    PT Home Exercise Plan No changes    Consulted and Agree with Plan of Care Patient             Patient will benefit from skilled therapeutic intervention in order to improve the following deficits and impairments:  Abnormal gait, Decreased activity tolerance, Decreased balance, Decreased coordination, Decreased endurance, Decreased mobility, Decreased range of motion, Decreased strength, Difficulty walking, Impaired perceived functional ability, Impaired sensation, Impaired tone, Impaired UE functional use, Improper body mechanics, Postural dysfunction, Cardiopulmonary status limiting activity, Impaired flexibility, Increased muscle spasms  Visit Diagnosis: Other abnormalities of gait and mobility  Difficulty in walking, not elsewhere classified  Muscle weakness (generalized)     Problem List Patient Active Problem  List   Diagnosis Date Noted   Acute respiratory disease due to COVID-19 virus 11/21/2020   Weakness    Hypoalbuminemia due to protein-calorie malnutrition (HCC)    Neurogenic bowel    Neurogenic bladder    Labile blood pressure    Neuropathic pain    Abscess of female pelvis    SVT (supraventricular tachycardia) (HCC)    Radial styloid tenosynovitis 03/12/2018   Wheelchair confinement 02/27/2018   Localized osteoporosis with current pathological fracture with routine healing 01/19/2017   Wrist fracture 01/16/2017   Sprain of ankle 03/23/2016   Closed fracture  of lateral malleolus 03/16/2016   Health care maintenance 01/24/2016   Blood pressure elevated without history of HTN 10/25/2015   Essential hypertension 10/25/2015   Multiple sclerosis ([place]) 10/02/2015   Chronic left shoulder pain 07/19/2015   Multiple sclerosis exacerbation ([place]) 07/14/2015   MS (multiple sclerosis) ([place]) 11/26/2014   Increased body mass index 11/26/2014   HPV test positive 11/26/2014   Status post laparoscopic supracervical hysterectomy 11/26/2014   Galactorrhea 11/26/2014   Back ache 05/21/2014   Adiposity 05/21/2014   Disordered sleep 05/21/2014   Muscle spasticity 05/21/2014   Spasticity 05/21/2014   Calculus of kidney 37/48/2707   Renal colic 86/75/4492   Hypercholesteremia 08/19/2013   Hereditary and idiopathic neuropathy 08/19/2013   Hypercholesterolemia without hypertriglyceridemia 08/19/2013   Bladder infection, chronic 07/25/2012   Disorder of bladder function 07/25/2012   Incomplete bladder emptying 07/25/2012   Microscopic hematuria 07/25/2012   Right upper quadrant pain 07/25/2012  Janna Arch, PT, DPT  06/23/2021, 12:15 PM  [place] MAIN [facility] SERVICES [address] [place], [place], [address] Phone: [phone]   Fax:  [phone]  Name: Lisa Williams MRN: [id number] Date of Birth: 1960/12/07

## 2021-06-28 ENCOUNTER — Ambulatory Visit: Payer: PPO

## 2021-06-28 ENCOUNTER — Other Ambulatory Visit: Payer: Self-pay

## 2021-06-28 DIAGNOSIS — R2689 Other abnormalities of gait and mobility: Secondary | ICD-10-CM | POA: Diagnosis not present

## 2021-06-28 DIAGNOSIS — M6281 Muscle weakness (generalized): Secondary | ICD-10-CM

## 2021-06-28 DIAGNOSIS — R262 Difficulty in walking, not elsewhere classified: Secondary | ICD-10-CM

## 2021-06-28 NOTE — Therapy (Signed)
[place] MAIN [facility] SERVICES [address] [place], [place], [address] Phone: [phone]   Fax:  [phone]  Physical Therapy Treatment  Patient Details  Name: Lisa Williams MRN: [id number] Date of Birth: October 07, 1960 Referring Provider (PT): Margette Fast MD   Encounter Date: 06/28/2021   PT End of Session - 06/28/21 1058     Visit Number 35    Number of Visits 44    Date for PT Re-Evaluation 07/28/21    Authorization Type 5/10 pn 2/2    PT Start Time 1015    PT Stop Time 1056    PT Time Calculation (min) 41 min    Equipment Utilized During Treatment Gait belt    Activity Tolerance Patient tolerated treatment well;Patient limited by fatigue    Behavior During Therapy [facility] for tasks assessed/performed             Past Medical History:  Diagnosis Date   Abdominal pain, right upper quadrant    Back pain    Calculus of kidney 12/09/2013   Chronic back pain    unspecified   Chronic left shoulder pain 8/56/3149   Complication of anesthesia    Functional disorder of bladder    other   Galactorrhea 11/26/2014   Chronic    Hereditary and idiopathic neuropathy 08/19/2013   HPV test positive    Hypercholesteremia 08/19/2013   Hypertension    Incomplete bladder emptying    Microscopic hematuria    MS (multiple sclerosis) (HCC)    Muscle spasticity 05/21/2014   Nonspecific findings on examination of urine    other   Osteopenia    PONV (postoperative nausea and vomiting)    Status post laparoscopic supracervical hysterectomy 11/26/2014   Tobacco user 11/26/2014   Wrist fracture     Past Surgical History:  Procedure Laterality Date   bilateral tubal ligation  1996   BREAST CYST EXCISION Left 2002   FRACTURE SURGERY     KNEE SURGERY     right   LAPAROSCOPIC SUPRACERVICAL HYSTERECTOMY  08/05/2013   ORIF WRIST FRACTURE Left 01/17/2017   Procedure: OPEN REDUCTION INTERNAL FIXATION (ORIF) WRIST FRACTURE;  Surgeon: Lovell Sheehan, MD;  Location: [facility];  Service: Orthopedics;  Laterality: Left;   RADIOLOGY WITH ANESTHESIA N/A 03/18/2020   Procedure: MRI WITH ANESTHESIA CERVICAL SPINE AND BRAIN  WITH AND WITHOUT CONTRAST;  Surgeon: Radiologist, Medication, MD;  Location: [place];  Service: Radiology;  Laterality: N/A;   TUBAL LIGATION Bilateral    VAGINAL HYSTERECTOMY  03/2006    There were no vitals filed for this visit.   Subjective Assessment - 06/28/21 1057     Subjective Patient went to [employer] yesterday. No falls or LOB since lats session.    Pertinent History Patient is a pleasant 61 year old female who is returning to PT after MS exacerbation and hospitalization for COVID. Patient went to SNF for one month and then received home health PT for a month (8 visits).  PMH includes HTN, SVT, neurogenic bowel, MS (1995), neuropathy, hx of wrist fx, HPV, Hypercholesteremia, adiposity, osteopenia, spasticity. She wears bilateral AFOs and uses a RW and/or wheelchair for mobility. She drives an adapted car.    Limitations Lifting;Standing;Walking;House hold activities;Sitting    How long can you sit comfortably? n/a    How long can you stand comfortably? 2 minutes    How long can you walk comfortably? 40 ft in house    Currently in Pain? No/denies  INTERVENTIONS   Standing in // bars: standing weight shift 20x each direction, VC to slow movement Hip flexion into RTB across // bars 8x each LE   Reach down and pick up 4 cones and pass to PT x4 trials; x 2 sets with seated rest break   Attempted knee bend -terminated due to fatigue  Seated: Seated boxing: cues for sequencing, body mechanics, and pace/rhythm for functional contraction and timing of muscle recruitment: -Cross body punches to mitts on PT hands with no back support for focused core stabilization with crossing midline 2x 60 seconds  -Cross body punch with secondary combination of elbow for full cross body rotation to PT  mitt for trunk stability, coordination, and cardiovascular challenge x 60 seconds -Upper cut to mitts in PT hands for core activation without back support with perturbations 60 seconds  Trunk flexion extension 8x   Ice pack donned throughout session to minimize exacerbation of MS symptoms in order to perform interventions.    Pt educated throughout session about proper posture and technique with exercises. Improved exercise technique, movement at target joints, use of target muscles after min to mod verbal, visual, tactile cues      Patient is highly motivated throughout physical therapy session and tolerates progressive stability and strengthening interventions well. She has improved sit to stands at // bars with improved recruitment of LE's and decreased reliance upon Ue's. Patient is fatigued by end of session. The pt will benefit from further skilled PT to improve mobility, strength, pain, gait and balance in order to decrease fall risk and increase QOL                PT Education - 06/28/21 1058     Education provided Yes    Education Details exercise technique, body mechanics    Person(s) Educated Patient    Methods Explanation;Demonstration;Tactile cues;Verbal cues    Comprehension Verbalized understanding;Returned demonstration;Verbal cues required;Tactile cues required              PT Short Term Goals - 05/05/21 1253       PT SHORT TERM GOAL #1   Title Patient will be independent in home exercise program to improve strength/mobility for better functional independence with ADLs.    Baseline 10/4 HEP provided 11/8: HEP compliant    Time 4    Period Weeks    Status Achieved    Target Date 03/08/21               PT Long Term Goals - 06/10/21 1129       PT LONG TERM GOAL #1   Title Patient will increase FOTO score to equal to or greater than  51/100   to demonstrate statistically significant improvement in mobility and quality of life.    Baseline  10/4: 42% 11/8: 36% 12/29: 45%; 06/09/2021= 36%    Time 12    Period Weeks    Status On-going    Target Date 07/28/21      PT LONG TERM GOAL #2   Title Patient (< 49 years old) will complete five times sit to stand test in < 20 seconds indicating an increased LE strength and improved balance.    Baseline 10/4: 33.15 seconds with heavy BUE support 11/8: 30 seconds; 12/29: deferred to future session. Pt attempted today but unable to complete d/t increase in R side LBP; 06/09/2021= 27.72 sec with UE support    Time 12    Period Weeks    Status Partially  Met    Target Date 07/28/21      PT LONG TERM GOAL #3   Title Patient will increase 10 meter walk test to <20 seconds as to improve gait speed for better community ambulation and to reduce fall risk.    Baseline 10/4: 43.17 seconds with RW 11/8: 40.8 seconds; 43 sec (0.23 m/s) with RW; 06/09/2021= 40.7 sec with RW    Time 12    Period Weeks    Status Partially Met    Target Date 07/28/21      PT LONG TERM GOAL #4   Title Patient will reduce timed up and go to <11 seconds to reduce fall risk and demonstrate improved transfer/gait ability.    Baseline 10/4: 52.66 seconds with RW 11/8: 49 seconds with RW; 12/29: 44.3 sec; 06/09/2021= 46.45 sec with use of RW    Time 12    Period Weeks    Status Partially Met    Target Date 07/28/21                   Plan - 06/28/21 1103     Clinical Impression Statement Patient is highly motivated throughout physical therapy session and tolerates progressive stability and strengthening interventions well. She has improved sit to stands at // bars with improved recruitment of LE's and decreased reliance upon Ue's. Patient is fatigued by end of session. The pt will benefit from further skilled PT to improve mobility, strength, pain, gait and balance in order to decrease fall risk and increase QOL    Personal Factors and Comorbidities Age;Comorbidity 3+;Finances;Fitness;Past/Current Experience;Sex;Social  Background;Time since onset of injury/illness/exacerbation;Transportation    Comorbidities HTN, SVT, neurogenic bowel, MS (1995), neuropathy, hx of wrist fx, HPV, Hypercholesteremia, adiposity, osteopenia, spasticity.    Examination-Activity Limitations Bathing;Bed Mobility;Bend;Carry;Continence;Dressing;Hygiene/Grooming;Stairs;Squat;Reach Overhead;Locomotion Level;Lift;Stand;Transfers;Toileting    Examination-Participation Restrictions Church;Cleaning;Community Activity;Laundry;Volunteer;Shop;Meal Prep;Yard Work    [employer]chant [employer] officer Evolving/Moderate complexity    Rehab Potential Good    PT Frequency 2x / week    PT Duration 12 weeks    PT Treatment/Interventions ADLs/Self Care Home Management;Aquatic Therapy;Biofeedback;Cryotherapy;Electrical Stimulation;Iontophoresis 1m/ml Dexamethasone;Traction;Moist Heat;Ultrasound;DME Instruction;Gait training;Stair training;Functional mobility training;Therapeutic activities;Therapeutic exercise;Balance training;Neuromuscular re-education;Patient/family education;Orthotic Fit/Training;Wheelchair mobility training;Manual techniques;Compression bandaging;Passive range of motion;Dry needling;Energy conservation;Taping;Splinting;Vestibular    PT Next Visit Plan in // bars weight shift and LE strengthening    PT Home Exercise Plan No changes    Consulted and Agree with Plan of Care Patient             Patient will benefit from skilled therapeutic intervention in order to improve the following deficits and impairments:  Abnormal gait, Decreased activity tolerance, Decreased balance, Decreased coordination, Decreased endurance, Decreased mobility, Decreased range of motion, Decreased strength, Difficulty walking, Impaired perceived functional ability, Impaired sensation, Impaired tone, Impaired UE functional use, Improper body mechanics, Postural dysfunction, Cardiopulmonary status limiting activity, Impaired flexibility, Increased muscle  spasms  Visit Diagnosis: Other abnormalities of gait and mobility  Difficulty in walking, not elsewhere classified  Muscle weakness (generalized)     Problem List Patient Active Problem List   Diagnosis Date Noted   Acute respiratory disease due to COVID-19 virus 11/21/2020   Weakness    Hypoalbuminemia due to protein-calorie malnutrition (HCC)    Neurogenic bowel    Neurogenic bladder    Labile blood pressure    Neuropathic pain    Abscess of female pelvis    SVT (supraventricular tachycardia) ([place]    Radial styloid tenosynovitis 03/12/2018   Wheelchair confinement 02/27/2018   Localized osteoporosis  with current pathological fracture with routine healing 01/19/2017   Wrist fracture 01/16/2017   Sprain of ankle 03/23/2016   Closed fracture of lateral malleolus 03/16/2016   Health care maintenance 01/24/2016   Blood pressure elevated without history of HTN 10/25/2015   Essential hypertension 10/25/2015   Multiple sclerosis ([place]) 10/02/2015   Chronic left shoulder pain 07/19/2015   Multiple sclerosis exacerbation ([place]) 07/14/2015   MS (multiple sclerosis) ([place]) 11/26/2014   Increased body mass index 11/26/2014   HPV test positive 11/26/2014   Status post laparoscopic supracervical hysterectomy 11/26/2014   Galactorrhea 11/26/2014   Back ache 05/21/2014   Adiposity 05/21/2014   Disordered sleep 05/21/2014   Muscle spasticity 05/21/2014   Spasticity 05/21/2014   Calculus of kidney 00/52/5910   Renal colic 28/90/2284   Hypercholesteremia 08/19/2013   Hereditary and idiopathic neuropathy 08/19/2013   Hypercholesterolemia without hypertriglyceridemia 08/19/2013   Bladder infection, chronic 07/25/2012   Disorder of bladder function 07/25/2012   Incomplete bladder emptying 07/25/2012   Microscopic hematuria 07/25/2012   Right upper quadrant pain 07/25/2012    Janna Arch, PT, DPT  06/28/2021, 11:04 AM  [place] [address] [place], [place], [address] Phone: [phone]   Fax:  [phone]  Name: Lisa Williams MRN: [id number] Date of Birth: 1960/07/23

## 2021-06-30 ENCOUNTER — Other Ambulatory Visit: Payer: Self-pay

## 2021-06-30 ENCOUNTER — Ambulatory Visit: Payer: PPO

## 2021-06-30 DIAGNOSIS — R2681 Unsteadiness on feet: Secondary | ICD-10-CM

## 2021-06-30 DIAGNOSIS — R2689 Other abnormalities of gait and mobility: Secondary | ICD-10-CM

## 2021-06-30 DIAGNOSIS — M6281 Muscle weakness (generalized): Secondary | ICD-10-CM

## 2021-06-30 DIAGNOSIS — R262 Difficulty in walking, not elsewhere classified: Secondary | ICD-10-CM

## 2021-06-30 NOTE — Therapy (Signed)
[place] MAIN [facility] SERVICES [place], [place], [address] Phone: [phone]   Fax:  [phone]  Physical Therapy Treatment  Patient Details  Name: Lisa Williams MRN: [id number] Date of Birth: [birth year] Referring Provider (PT): Margette Fast MD   Encounter Date: 06/30/2021   PT End of Session - 06/30/21 1104     Visit Number 36    Number of Visits 44    Date for PT Re-Evaluation 07/28/21    Authorization Type 6/10 pn 2/2    PT Start Time 1015    PT Stop Time 1059    PT Time Calculation (min) 44 min    Equipment Utilized During Treatment Gait belt    Activity Tolerance Patient tolerated treatment well;Patient limited by fatigue    Behavior During Therapy [facility] for tasks assessed/performed             Past Medical History:  Diagnosis Date   Abdominal pain, right upper quadrant    Back pain    Calculus of kidney 12/09/2013   Chronic back pain    unspecified   Chronic left shoulder pain 11/08/5007   Complication of anesthesia    Functional disorder of bladder    other   Galactorrhea 11/26/2014   Chronic    Hereditary and idiopathic neuropathy 08/19/2013   HPV test positive    Hypercholesteremia 08/19/2013   Hypertension    Incomplete bladder emptying    Microscopic hematuria    MS (multiple sclerosis) (HCC)    Muscle spasticity 05/21/2014   Nonspecific findings on examination of urine    other   Osteopenia    PONV (postoperative nausea and vomiting)    Status post laparoscopic supracervical hysterectomy 11/26/2014   Tobacco user 11/26/2014   Wrist fracture     Past Surgical History:  Procedure Laterality Date   bilateral tubal ligation  1996   BREAST CYST EXCISION Left 2002   FRACTURE SURGERY     KNEE SURGERY     right   LAPAROSCOPIC SUPRACERVICAL HYSTERECTOMY  08/05/2013   ORIF WRIST FRACTURE Left 01/17/2017   Procedure: OPEN REDUCTION INTERNAL FIXATION (ORIF) WRIST FRACTURE;  Surgeon: Lovell Sheehan, MD;  Location: [facility];  Service: Orthopedics;  Laterality: Left;   RADIOLOGY WITH ANESTHESIA N/A 03/18/2020   Procedure: MRI WITH ANESTHESIA CERVICAL SPINE AND BRAIN  WITH AND WITHOUT CONTRAST;  Surgeon: Radiologist, Medication, MD;  Location: [place];  Service: Radiology;  Laterality: N/A;   TUBAL LIGATION Bilateral    VAGINAL HYSTERECTOMY  03/2006    There were no vitals filed for this visit.   Subjective Assessment - 06/30/21 1103     Subjective Patient did some laundry since last session. No falls or LOB. Has been compliant with HEP    Pertinent History Patient is a pleasant 61 year old female who is returning to PT after MS exacerbation and hospitalization for COVID. Patient went to SNF for one month and then received home health PT for a month (8 visits).  PMH includes HTN, SVT, neurogenic bowel, MS (1995), neuropathy, hx of wrist fx, HPV, Hypercholesteremia, adiposity, osteopenia, spasticity. She wears bilateral AFOs and uses a RW and/or wheelchair for mobility. She drives an adapted car.    Limitations Lifting;Standing;Walking;House hold activities;Sitting    How long can you sit comfortably? n/a    How long can you stand comfortably? 2 minutes    How long can you walk comfortably? 40 ft in house    Currently  in Pain? No/denies                     Standing/walking with RW -walking with bRW, CGA and wheelchair follow, use of ice. Total of 105 ft, 2 rest break. Patient has decreased foot clearance bilaterally, R>L as she fatigues. Heavy BUE support required as patient fatigued.     Seated:   Seated on dynadisc:   -static sit arms crossed 30 seconds -static sit against pertubation 60 seconds  -anterior posterior trunk lean 10x each direction -medial lateral trunk lean outside BOS 10x each LE  modified windmills 10x each side RTB row 10x RTB adduction 10x each LE RTB abduction 10x each LE     Ice pack donned throughout session to minimize exacerbation of  MS symptoms in order to perform interventions     Pt educated throughout session about proper posture and technique with exercises. Improved exercise technique, movement at target joints, use of target muscles after min to mod verbal, visual, tactile cues.   Patient tolerates progressive core stabilization interventions well with limited LOB on dynadisc. She is able to ambulate with new shoes and rest breaks despite warmer weather. She is highly motivated throughout session. Her posture and strength are improving and carrying over into her mobility. The pt will benefit from further skilled PT to improve mobility, strength, pain, gait and balance in order to decrease fall risk and increase QOL                   PT Education - 06/30/21 1104     Education provided Yes    Education Details exercise technique, body mechanics    Person(s) Educated Patient    Methods Explanation;Demonstration;Tactile cues;Verbal cues    Comprehension Verbalized understanding;Returned demonstration;Verbal cues required;Tactile cues required              PT Short Term Goals - 05/05/21 1253       PT SHORT TERM GOAL #1   Title Patient will be independent in home exercise program to improve strength/mobility for better functional independence with ADLs.    Baseline 10/4 HEP provided 11/8: HEP compliant    Time 4    Period Weeks    Status Achieved    Target Date 03/08/21               PT Long Term Goals - 06/10/21 1129       PT LONG TERM GOAL #1   Title Patient will increase FOTO score to equal to or greater than  51/100   to demonstrate statistically significant improvement in mobility and quality of life.    Baseline 10/4: 42% 11/8: 36% 12/29: 45%; 06/09/2021= 36%    Time 12    Period Weeks    Status On-going    Target Date 07/28/21      PT LONG TERM GOAL #2   Title Patient (< 24 years old) will complete five times sit to stand test in < 20 seconds indicating an increased LE  strength and improved balance.    Baseline 10/4: 33.15 seconds with heavy BUE support 11/8: 30 seconds; 12/29: deferred to future session. Pt attempted today but unable to complete d/t increase in R side LBP; 06/09/2021= 27.72 sec with UE support    Time 12    Period Weeks    Status Partially Met    Target Date 07/28/21      PT LONG TERM GOAL #3   Title Patient will increase  10 meter walk test to <20 seconds as to improve gait speed for better community ambulation and to reduce fall risk.    Baseline 10/4: 43.17 seconds with RW 11/8: 40.8 seconds; 43 sec (0.23 m/s) with RW; 06/09/2021= 40.7 sec with RW    Time 12    Period Weeks    Status Partially Met    Target Date 07/28/21      PT LONG TERM GOAL #4   Title Patient will reduce timed up and go to <11 seconds to reduce fall risk and demonstrate improved transfer/gait ability.    Baseline 10/4: 52.66 seconds with RW 11/8: 49 seconds with RW; 12/29: 44.3 sec; 06/09/2021= 46.45 sec with use of RW    Time 12    Period Weeks    Status Partially Met    Target Date 07/28/21                   Plan - 06/30/21 1106     Clinical Impression Statement Patient tolerates progressive core stabilization interventions well with limited LOB on dynadisc. She is able to ambulate with new shoes and rest breaks despite warmer weather. She is highly motivated throughout session. Her posture and strength are improving and carrying over into her mobility. The pt will benefit from further skilled PT to improve mobility, strength, pain, gait and balance in order to decrease fall risk and increase QOL    Personal Factors and Comorbidities Age;Comorbidity 3+;Finances;Fitness;Past/Current Experience;Sex;Social Background;Time since onset of injury/illness/exacerbation;Transportation    Comorbidities HTN, SVT, neurogenic bowel, MS (1995), neuropathy, hx of wrist fx, HPV, Hypercholesteremia, adiposity, osteopenia, spasticity.    Examination-Activity Limitations  Bathing;Bed Mobility;Bend;Carry;Continence;Dressing;Hygiene/Grooming;Stairs;Squat;Reach Overhead;Locomotion Level;Lift;Stand;Transfers;Toileting    Examination-Participation Restrictions Church;Cleaning;Community Activity;Laundry;Volunteer;Shop;Meal Prep;Yard Work    [employer]chant [employer] officer Evolving/Moderate complexity    Rehab Potential Good    PT Frequency 2x / week    PT Duration 12 weeks    PT Treatment/Interventions ADLs/Self Care Home Management;Aquatic Therapy;Biofeedback;Cryotherapy;Electrical Stimulation;Iontophoresis 18m/ml Dexamethasone;Traction;Moist Heat;Ultrasound;DME Instruction;Gait training;Stair training;Functional mobility training;Therapeutic activities;Therapeutic exercise;Balance training;Neuromuscular re-education;Patient/family education;Orthotic Fit/Training;Wheelchair mobility training;Manual techniques;Compression bandaging;Passive range of motion;Dry needling;Energy conservation;Taping;Splinting;Vestibular    PT Next Visit Plan in // bars weight shift and LE strengthening    PT Home Exercise Plan No changes    Consulted and Agree with Plan of Care Patient             Patient will benefit from skilled therapeutic intervention in order to improve the following deficits and impairments:  Abnormal gait, Decreased activity tolerance, Decreased balance, Decreased coordination, Decreased endurance, Decreased mobility, Decreased range of motion, Decreased strength, Difficulty walking, Impaired perceived functional ability, Impaired sensation, Impaired tone, Impaired UE functional use, Improper body mechanics, Postural dysfunction, Cardiopulmonary status limiting activity, Impaired flexibility, Increased muscle spasms  Visit Diagnosis: Other abnormalities of gait and mobility  Difficulty in walking, not elsewhere classified  Muscle weakness (generalized)  Unsteadiness on feet     Problem List Patient Active Problem List   Diagnosis Date Noted   Acute  respiratory disease due to COVID-19 virus 11/21/2020   Weakness    Hypoalbuminemia due to protein-calorie malnutrition (HCC)    Neurogenic bowel    Neurogenic bladder    Labile blood pressure    Neuropathic pain    Abscess of female pelvis    SVT (supraventricular tachycardia) (HCC)    Radial styloid tenosynovitis 03/12/2018   Wheelchair confinement 02/27/2018   Localized osteoporosis with current pathological fracture with routine healing 01/19/2017   Wrist fracture 01/16/2017   Sprain of  ankle 03/23/2016   Closed fracture of lateral malleolus 03/16/2016   Health care maintenance 01/24/2016   Blood pressure elevated without history of HTN 10/25/2015   Essential hypertension 10/25/2015   Multiple sclerosis ([place]) 10/02/2015   Chronic left shoulder pain 07/19/2015   Multiple sclerosis exacerbation ([place]) 07/14/2015   MS (multiple sclerosis) ([place]) 11/26/2014   Increased body mass index 11/26/2014   HPV test positive 11/26/2014   Status post laparoscopic supracervical hysterectomy 11/26/2014   Galactorrhea 11/26/2014   Back ache 05/21/2014   Adiposity 05/21/2014   Disordered sleep 05/21/2014   Muscle spasticity 05/21/2014   Spasticity 05/21/2014   Calculus of kidney 76/16/0737   Renal colic 10/62/6948   Hypercholesteremia 08/19/2013   Hereditary and idiopathic neuropathy 08/19/2013   Hypercholesterolemia without hypertriglyceridemia 08/19/2013   Bladder infection, chronic 07/25/2012   Disorder of bladder function 07/25/2012   Incomplete bladder emptying 07/25/2012   Microscopic hematuria 07/25/2012   Right upper quadrant pain 07/25/2012    Janna Arch, PT, DPT  06/30/2021, 11:07 AM  [place] MAIN [facility] SERVICES [address] [place], [place], [address] Phone: [phone]   Fax:  [phone]  Name: Lisa Williams MRN: [id number] Date of Birth: 1960/12/12

## 2021-07-05 ENCOUNTER — Other Ambulatory Visit: Payer: Self-pay

## 2021-07-05 ENCOUNTER — Ambulatory Visit: Payer: PPO

## 2021-07-05 DIAGNOSIS — R2689 Other abnormalities of gait and mobility: Secondary | ICD-10-CM

## 2021-07-05 DIAGNOSIS — Z131 Encounter for screening for diabetes mellitus: Secondary | ICD-10-CM | POA: Diagnosis not present

## 2021-07-05 DIAGNOSIS — M6281 Muscle weakness (generalized): Secondary | ICD-10-CM

## 2021-07-05 DIAGNOSIS — I10 Essential (primary) hypertension: Secondary | ICD-10-CM | POA: Diagnosis not present

## 2021-07-05 DIAGNOSIS — Z13220 Encounter for screening for lipoid disorders: Secondary | ICD-10-CM | POA: Diagnosis not present

## 2021-07-05 DIAGNOSIS — R2681 Unsteadiness on feet: Secondary | ICD-10-CM

## 2021-07-05 DIAGNOSIS — R262 Difficulty in walking, not elsewhere classified: Secondary | ICD-10-CM

## 2021-07-05 DIAGNOSIS — Z1329 Encounter for screening for other suspected endocrine disorder: Secondary | ICD-10-CM | POA: Diagnosis not present

## 2021-07-05 NOTE — Therapy (Signed)
[place] MAIN [facility] SERVICES [address] [place], [place], [address] Phone: [phone]   Fax:  [phone]  Physical Therapy Treatment  Patient Details  Name: Lisa Williams MRN: [id number] Date of Birth: 1961/01/16 Referring Provider (PT): Margette Fast MD   Encounter Date: 07/05/2021   PT End of Session - 07/05/21 1324     Visit Number 37    Number of Visits 44    Date for PT Re-Evaluation 07/28/21    Authorization Type 7/10 pn 2/2    PT Start Time 1015    PT Stop Time 1059    PT Time Calculation (min) 44 min    Equipment Utilized During Treatment Gait belt    Activity Tolerance Patient tolerated treatment well;Patient limited by fatigue    Behavior During Therapy [facility] for tasks assessed/performed             Past Medical History:  Diagnosis Date   Abdominal pain, right upper quadrant    Back pain    Calculus of kidney 12/09/2013   Chronic back pain    unspecified   Chronic left shoulder pain 1/82/9937   Complication of anesthesia    Functional disorder of bladder    other   Galactorrhea 11/26/2014   Chronic    Hereditary and idiopathic neuropathy 08/19/2013   HPV test positive    Hypercholesteremia 08/19/2013   Hypertension    Incomplete bladder emptying    Microscopic hematuria    MS (multiple sclerosis) (HCC)    Muscle spasticity 05/21/2014   Nonspecific findings on examination of urine    other   Osteopenia    PONV (postoperative nausea and vomiting)    Status post laparoscopic supracervical hysterectomy 11/26/2014   Tobacco user 11/26/2014   Wrist fracture     Past Surgical History:  Procedure Laterality Date   bilateral tubal ligation  1996   BREAST CYST EXCISION Left 2002   FRACTURE SURGERY     KNEE SURGERY     right   LAPAROSCOPIC SUPRACERVICAL HYSTERECTOMY  08/05/2013   ORIF WRIST FRACTURE Left 01/17/2017   Procedure: OPEN REDUCTION INTERNAL FIXATION (ORIF) WRIST FRACTURE;  Surgeon: Lovell Sheehan, MD;  Location: [facility];  Service: Orthopedics;  Laterality: Left;   RADIOLOGY WITH ANESTHESIA N/A 03/18/2020   Procedure: MRI WITH ANESTHESIA CERVICAL SPINE AND BRAIN  WITH AND WITHOUT CONTRAST;  Surgeon: Radiologist, Medication, MD;  Location: [place];  Service: Radiology;  Laterality: N/A;   TUBAL LIGATION Bilateral    VAGINAL HYSTERECTOMY  03/2006    There were no vitals filed for this visit.   Subjective Assessment - 07/05/21 1324     Subjective Patient reports she is doing well. No falls or LOB since last session. Patient gave blood prior to PT session.    Pertinent History Patient is a pleasant 61 year old female who is returning to PT after MS exacerbation and hospitalization for COVID. Patient went to SNF for one month and then received home health PT for a month (8 visits).  PMH includes HTN, SVT, neurogenic bowel, MS (1995), neuropathy, hx of wrist fx, HPV, Hypercholesteremia, adiposity, osteopenia, spasticity. She wears bilateral AFOs and uses a RW and/or wheelchair for mobility. She drives an adapted car.    Limitations Lifting;Standing;Walking;House hold activities;Sitting    How long can you sit comfortably? n/a    How long can you stand comfortably? 2 minutes    How long can you walk comfortably? 40 ft in house  Currently in Pain? No/denies                    Standing/walking with RW -walking with bRW, CGA and wheelchair follow, use of ice. Total of 98 ft, 1 rest break. Patient has decreased foot clearance bilaterally, R>L as she fatigues. Heavy BUE support required as patient fatigued. She is able to negotiate changing floor surfaces without LOB     Seated:   Seated on dynadisc:   -static sit arms crossed 30 seconds -anterior posterior trunk lean 10x each direction -medial lateral trunk lean outside BOS 10x each LE   modified windmills 10x each side  GTB adduction 15x each LE GTB abduction 15xeach LE Hip flexion isometric 10x each LE Hamstring  lengthening 8x each LE      Ice pack donned throughout session to minimize exacerbation of MS symptoms in order to perform interventions     Pt educated throughout session about proper posture and technique with exercises. Improved exercise technique, movement at target joints, use of target muscles after min to mod verbal, visual, tactile cues.     Patient tolerates ambulation across changing surfaces in addition to core stabilization techniques. She is highly motivated throughout session despite feeling fatigued from giving blood. Core stabilization on dynadisc is improving with decreased episodes of instability. Her LE strength additionally is improving with use of green theraband. The pt will benefit from further skilled PT to improve mobility, strength, pain, gait and balance in order to decrease fall risk and increase QOL                   PT Education - 07/05/21 1324     Education provided Yes    Education Details exercise technique, body mechanics    Person(s) Educated Patient    Methods Explanation;Demonstration;Tactile cues;Verbal cues    Comprehension Verbalized understanding;Returned demonstration;Verbal cues required;Tactile cues required              PT Short Term Goals - 05/05/21 1253       PT SHORT TERM GOAL #1   Title Patient will be independent in home exercise program to improve strength/mobility for better functional independence with ADLs.    Baseline 10/4 HEP provided 11/8: HEP compliant    Time 4    Period Weeks    Status Achieved    Target Date 03/08/21               PT Long Term Goals - 06/10/21 1129       PT LONG TERM GOAL #1   Title Patient will increase FOTO score to equal to or greater than  51/100   to demonstrate statistically significant improvement in mobility and quality of life.    Baseline 10/4: 42% 11/8: 36% 12/29: 45%; 06/09/2021= 36%    Time 12    Period Weeks    Status On-going    Target Date 07/28/21       PT LONG TERM GOAL #2   Title Patient (< 8 years old) will complete five times sit to stand test in < 20 seconds indicating an increased LE strength and improved balance.    Baseline 10/4: 33.15 seconds with heavy BUE support 11/8: 30 seconds; 12/29: deferred to future session. Pt attempted today but unable to complete d/t increase in R side LBP; 06/09/2021= 27.72 sec with UE support    Time 12    Period Weeks    Status Partially Met    Target Date  07/28/21      PT LONG TERM GOAL #3   Title Patient will increase 10 meter walk test to <20 seconds as to improve gait speed for better community ambulation and to reduce fall risk.    Baseline 10/4: 43.17 seconds with RW 11/8: 40.8 seconds; 43 sec (0.23 m/s) with RW; 06/09/2021= 40.7 sec with RW    Time 12    Period Weeks    Status Partially Met    Target Date 07/28/21      PT LONG TERM GOAL #4   Title Patient will reduce timed up and go to <11 seconds to reduce fall risk and demonstrate improved transfer/gait ability.    Baseline 10/4: 52.66 seconds with RW 11/8: 49 seconds with RW; 12/29: 44.3 sec; 06/09/2021= 46.45 sec with use of RW    Time 12    Period Weeks    Status Partially Met    Target Date 07/28/21                   Plan - 07/05/21 1327     Clinical Impression Statement Patient tolerates ambulation across changing surfaces in addition to core stabilization techniques. She is highly motivated throughout session despite feeling fatigued from giving blood. Core stabilization on dynadisc is improving with decreased episodes of instability. Her LE strength additionally is improving with use of green theraband. The pt will benefit from further skilled PT to improve mobility, strength, pain, gait and balance in order to decrease fall risk and increase QOL    Personal Factors and Comorbidities Age;Comorbidity 3+;Finances;Fitness;Past/Current Experience;Sex;Social Background;Time since onset of injury/illness/exacerbation;Transportation     Comorbidities HTN, SVT, neurogenic bowel, MS (1995), neuropathy, hx of wrist fx, HPV, Hypercholesteremia, adiposity, osteopenia, spasticity.    Examination-Activity Limitations Bathing;Bed Mobility;Bend;Carry;Continence;Dressing;Hygiene/Grooming;Stairs;Squat;Reach Overhead;Locomotion Level;Lift;Stand;Transfers;Toileting    Examination-Participation Restrictions Church;Cleaning;Community Activity;Laundry;Volunteer;Shop;Meal Prep;Yard Work    [employer]chant [employer] officer Evolving/Moderate complexity    Rehab Potential Good    PT Frequency 2x / week    PT Duration 12 weeks    PT Treatment/Interventions ADLs/Self Care Home Management;Aquatic Therapy;Biofeedback;Cryotherapy;Electrical Stimulation;Iontophoresis 108m/ml Dexamethasone;Traction;Moist Heat;Ultrasound;DME Instruction;Gait training;Stair training;Functional mobility training;Therapeutic activities;Therapeutic exercise;Balance training;Neuromuscular re-education;Patient/family education;Orthotic Fit/Training;Wheelchair mobility training;Manual techniques;Compression bandaging;Passive range of motion;Dry needling;Energy conservation;Taping;Splinting;Vestibular    PT Next Visit Plan in // bars weight shift and LE strengthening    PT Home Exercise Plan No changes    Consulted and Agree with Plan of Care Patient             Patient will benefit from skilled therapeutic intervention in order to improve the following deficits and impairments:  Abnormal gait, Decreased activity tolerance, Decreased balance, Decreased coordination, Decreased endurance, Decreased mobility, Decreased range of motion, Decreased strength, Difficulty walking, Impaired perceived functional ability, Impaired sensation, Impaired tone, Impaired UE functional use, Improper body mechanics, Postural dysfunction, Cardiopulmonary status limiting activity, Impaired flexibility, Increased muscle spasms  Visit Diagnosis: Other abnormalities of gait and mobility  Difficulty  in walking, not elsewhere classified  Muscle weakness (generalized)  Unsteadiness on feet     Problem List Patient Active Problem List   Diagnosis Date Noted   Acute respiratory disease due to COVID-19 virus 11/21/2020   Weakness    Hypoalbuminemia due to protein-calorie malnutrition (HCC)    Neurogenic bowel    Neurogenic bladder    Labile blood pressure    Neuropathic pain    Abscess of female pelvis    [place]    Radial styloid tenosynovitis 03/12/2018   Wheelchair confinement 02/27/2018  Localized osteoporosis with current pathological fracture with routine healing 01/19/2017   Wrist fracture 01/16/2017   Sprain of ankle 03/23/2016   Closed fracture of lateral malleolus 03/16/2016   Health care maintenance 01/24/2016   Blood pressure elevated without history of HTN 10/25/2015   Essential hypertension 10/25/2015   Multiple sclerosis ([place]) 10/02/2015   Chronic left shoulder pain 07/19/2015   Multiple sclerosis exacerbation ([place]) 07/14/2015   MS (multiple sclerosis) ([place]) 11/26/2014   Increased body mass index 11/26/2014   HPV test positive 11/26/2014   Status post laparoscopic supracervical hysterectomy 11/26/2014   Galactorrhea 11/26/2014   Back ache 05/21/2014   Adiposity 05/21/2014   Disordered sleep 05/21/2014   Muscle spasticity 05/21/2014   Spasticity 05/21/2014   Calculus of kidney 39/58/4417   Renal colic 12/78/7183   Hypercholesteremia 08/19/2013   Hereditary and idiopathic neuropathy 08/19/2013   Hypercholesterolemia without hypertriglyceridemia 08/19/2013   Bladder infection, chronic 07/25/2012   Disorder of bladder function 07/25/2012   Incomplete bladder emptying 07/25/2012   Microscopic hematuria 07/25/2012   Right upper quadrant pain 07/25/2012    Janna Arch, PT, DPT  07/05/2021, 1:28 PM  [place] MAIN [facility] SERVICES [address] [place], [place], [address] Phone:  [phone]   Fax:  [phone]  Name: Bre Pecina Ionescu MRN: [id number] Date of Birth: [birth year]

## 2021-07-07 ENCOUNTER — Ambulatory Visit: Payer: PPO | Attending: Neurology

## 2021-07-07 ENCOUNTER — Other Ambulatory Visit: Payer: Self-pay

## 2021-07-07 DIAGNOSIS — R262 Difficulty in walking, not elsewhere classified: Secondary | ICD-10-CM | POA: Diagnosis not present

## 2021-07-07 DIAGNOSIS — M6281 Muscle weakness (generalized): Secondary | ICD-10-CM | POA: Insufficient documentation

## 2021-07-07 DIAGNOSIS — R2689 Other abnormalities of gait and mobility: Secondary | ICD-10-CM | POA: Insufficient documentation

## 2021-07-07 DIAGNOSIS — R2681 Unsteadiness on feet: Secondary | ICD-10-CM | POA: Insufficient documentation

## 2021-07-07 NOTE — Therapy (Signed)
[place] MAIN [facility] SERVICES [place], [place], [address] Phone: [phone]   Fax:  [phone]  Physical Therapy Treatment  Patient Details  Name: Lisa Williams MRN: [id number] Date of Birth: [birth year] Referring Provider (PT): Margette Fast MD   Encounter Date: 07/07/2021   PT End of Session - 07/07/21 1107     Visit Number 38    Number of Visits 44    Date for PT Re-Evaluation 07/28/21    Authorization Type 8/10 pn 2/2    PT Start Time 1015    PT Stop Time 1100    PT Time Calculation (min) 45 min    Equipment Utilized During Treatment Gait belt    Activity Tolerance Patient tolerated treatment well;Patient limited by fatigue    Behavior During Therapy [facility] for tasks assessed/performed             Past Medical History:  Diagnosis Date   Abdominal pain, right upper quadrant    Back pain    Calculus of kidney 12/09/2013   Chronic back pain    unspecified   Chronic left shoulder pain 07/06/6008   Complication of anesthesia    Functional disorder of bladder    other   Galactorrhea 11/26/2014   Chronic    Hereditary and idiopathic neuropathy 08/19/2013   HPV test positive    Hypercholesteremia 08/19/2013   Hypertension    Incomplete bladder emptying    Microscopic hematuria    MS (multiple sclerosis) (HCC)    Muscle spasticity 05/21/2014   Nonspecific findings on examination of urine    other   Osteopenia    PONV (postoperative nausea and vomiting)    Status post laparoscopic supracervical hysterectomy 11/26/2014   Tobacco user 11/26/2014   Wrist fracture     Past Surgical History:  Procedure Laterality Date   bilateral tubal ligation  1996   BREAST CYST EXCISION Left 2002   FRACTURE SURGERY     KNEE SURGERY     right   LAPAROSCOPIC SUPRACERVICAL HYSTERECTOMY  08/05/2013   ORIF WRIST FRACTURE Left 01/17/2017   Procedure: OPEN REDUCTION INTERNAL FIXATION (ORIF) WRIST FRACTURE;  Surgeon: Lovell Sheehan, MD;  Location: [facility];  Service: Orthopedics;  Laterality: Left;   RADIOLOGY WITH ANESTHESIA N/A 03/18/2020   Procedure: MRI WITH ANESTHESIA CERVICAL SPINE AND BRAIN  WITH AND WITHOUT CONTRAST;  Surgeon: Radiologist, Medication, MD;  Location: [place];  Service: Radiology;  Laterality: N/A;   TUBAL LIGATION Bilateral    VAGINAL HYSTERECTOMY  03/2006    There were no vitals filed for this visit.   Subjective Assessment - 07/07/21 1106     Subjective Patient reports no falls or LOB since last session. Has been compliant with HEP    Pertinent History Patient is a pleasant 61 year old female who is returning to PT after MS exacerbation and hospitalization for COVID. Patient went to SNF for one month and then received home health PT for a month (8 visits).  PMH includes HTN, SVT, neurogenic bowel, MS (1995), neuropathy, hx of wrist fx, HPV, Hypercholesteremia, adiposity, osteopenia, spasticity. She wears bilateral AFOs and uses a RW and/or wheelchair for mobility. She drives an adapted car.    Limitations Lifting;Standing;Walking;House hold activities;Sitting    How long can you sit comfortably? n/a    How long can you stand comfortably? 2 minutes    How long can you walk comfortably? 40 ft in house    Currently in Pain?  No/denies                INTERVENTIONS   Standing in // bars: standing weight shift 10x each direction, VC to slow movement Reach down and pick up 4 cones and pass to PT x4 trials; x 2 sets with seated rest break  Modified tandem stance without UE support 15 seconds each LE placement   Seated: Seated boxing: cues for sequencing, body mechanics, and pace/rhythm for functional contraction and timing of muscle recruitment: -Cross body punches to mitts on PT hands with no back support for focused core stabilization with crossing midline 2x 60 seconds  -Cross body punch with secondary combination of elbow for full cross body rotation to PT mitt for trunk  stability, coordination, and cardiovascular challenge x 60 seconds -Upper cut to mitts in PT hands for core activation without back support with perturbations 60 seconds  RTB abduction 15x RTB adduction 15x RTB overhead Y raise 10x RTB cross body abduction 10x   Ice pack donned throughout session to minimize exacerbation of MS symptoms in order to perform interventions.    Pt educated throughout session about proper posture and technique with exercises. Improved exercise technique, movement at target joints, use of target muscles after min to mod verbal, visual, tactile cues    Patient presents with excellent motivation to physical therapy session. She is able to perform improved reaches in standing position without instability. Fatigue with standing continues to be present and will be an area of focus. Her core stability and reaction timing with utilization of mitts and gloves is noted with progression of velocity and power behind punches. The pt will benefit from further skilled PT to improve mobility, strength, pain, gait and balance in order to decrease fall risk and increase QOL                      PT Education - 07/07/21 1107     Education provided Yes    Education Details exercise technique, body mechanics    Person(s) Educated Patient    Methods Other (comment);Explanation;Demonstration;Tactile cues;Verbal cues    Comprehension Verbalized understanding;Returned demonstration;Verbal cues required;Tactile cues required              PT Short Term Goals - 05/05/21 1253       PT SHORT TERM GOAL #1   Title Patient will be independent in home exercise program to improve strength/mobility for better functional independence with ADLs.    Baseline 10/4 HEP provided 11/8: HEP compliant    Time 4    Period Weeks    Status Achieved    Target Date 03/08/21               PT Long Term Goals - 06/10/21 1129       PT LONG TERM GOAL #1   Title Patient will  increase FOTO score to equal to or greater than  51/100   to demonstrate statistically significant improvement in mobility and quality of life.    Baseline 10/4: 42% 11/8: 36% 12/29: 45%; 06/09/2021= 36%    Time 12    Period Weeks    Status On-going    Target Date 07/28/21      PT LONG TERM GOAL #2   Title Patient (< 59 years old) will complete five times sit to stand test in < 20 seconds indicating an increased LE strength and improved balance.    Baseline 10/4: 33.15 seconds with heavy BUE support 11/8: 30  seconds; 12/29: deferred to future session. Pt attempted today but unable to complete d/t increase in R side LBP; 06/09/2021= 27.72 sec with UE support    Time 12    Period Weeks    Status Partially Met    Target Date 07/28/21      PT LONG TERM GOAL #3   Title Patient will increase 10 meter walk test to <20 seconds as to improve gait speed for better community ambulation and to reduce fall risk.    Baseline 10/4: 43.17 seconds with RW 11/8: 40.8 seconds; 43 sec (0.23 m/s) with RW; 06/09/2021= 40.7 sec with RW    Time 12    Period Weeks    Status Partially Met    Target Date 07/28/21      PT LONG TERM GOAL #4   Title Patient will reduce timed up and go to <11 seconds to reduce fall risk and demonstrate improved transfer/gait ability.    Baseline 10/4: 52.66 seconds with RW 11/8: 49 seconds with RW; 12/29: 44.3 sec; 06/09/2021= 46.45 sec with use of RW    Time 12    Period Weeks    Status Partially Met    Target Date 07/28/21                   Plan - 07/07/21 1108     Clinical Impression Statement Patient presents with excellent motivation to physical therapy session. She is able to perform improved reaches in standing position without instability. Fatigue with standing continues to be present and will be an area of focus. Her core stability and reaction timing with utilization of mitts and gloves is noted with progression of velocity and power behind punches. The pt will  benefit from further skilled PT to improve mobility, strength, pain, gait and balance in order to decrease fall risk and increase QOL    Personal Factors and Comorbidities Age;Comorbidity 3+;Finances;Fitness;Past/Current Experience;Sex;Social Background;Time since onset of injury/illness/exacerbation;Transportation    Comorbidities HTN, SVT, neurogenic bowel, MS (1995), neuropathy, hx of wrist fx, HPV, Hypercholesteremia, adiposity, osteopenia, spasticity.    Examination-Activity Limitations Bathing;Bed Mobility;Bend;Carry;Continence;Dressing;Hygiene/Grooming;Stairs;Squat;Reach Overhead;Locomotion Level;Lift;Stand;Transfers;Toileting    Examination-Participation Restrictions Church;Cleaning;Community Activity;Laundry;Volunteer;Shop;Meal Prep;Yard Work    [employer]chant [employer] officer Evolving/Moderate complexity    Rehab Potential Good    PT Frequency 2x / week    PT Duration 12 weeks    PT Treatment/Interventions ADLs/Self Care Home Management;Aquatic Therapy;Biofeedback;Cryotherapy;Electrical Stimulation;Iontophoresis 39m/ml Dexamethasone;Traction;Moist Heat;Ultrasound;DME Instruction;Gait training;Stair training;Functional mobility training;Therapeutic activities;Therapeutic exercise;Balance training;Neuromuscular re-education;Patient/family education;Orthotic Fit/Training;Wheelchair mobility training;Manual techniques;Compression bandaging;Passive range of motion;Dry needling;Energy conservation;Taping;Splinting;Vestibular    PT Next Visit Plan in // bars weight shift and LE strengthening    PT Home Exercise Plan No changes    Consulted and Agree with Plan of Care Patient             Patient will benefit from skilled therapeutic intervention in order to improve the following deficits and impairments:  Abnormal gait, Decreased activity tolerance, Decreased balance, Decreased coordination, Decreased endurance, Decreased mobility, Decreased range of motion, Decreased strength, Difficulty  walking, Impaired perceived functional ability, Impaired sensation, Impaired tone, Impaired UE functional use, Improper body mechanics, Postural dysfunction, Cardiopulmonary status limiting activity, Impaired flexibility, Increased muscle spasms  Visit Diagnosis: Other abnormalities of gait and mobility  Difficulty in walking, not elsewhere classified  Muscle weakness (generalized)  Unsteadiness on feet     Problem List Patient Active Problem List   Diagnosis Date Noted   Acute respiratory disease due to COVID-19 virus 11/21/2020   Weakness  Hypoalbuminemia due to protein-calorie malnutrition (HCC)    Neurogenic bowel    Neurogenic bladder    Labile blood pressure    Neuropathic pain    Abscess of female pelvis    SVT (supraventricular tachycardia) (HCC)    Radial styloid tenosynovitis 03/12/2018   Wheelchair confinement 02/27/2018   Localized osteoporosis with current pathological fracture with routine healing 01/19/2017   Wrist fracture 01/16/2017   Sprain of ankle 03/23/2016   Closed fracture of lateral malleolus 03/16/2016   Health care maintenance 01/24/2016   Blood pressure elevated without history of HTN 10/25/2015   Essential hypertension 10/25/2015   Multiple sclerosis ([place]) 10/02/2015   Chronic left shoulder pain 07/19/2015   Multiple sclerosis exacerbation ([place]) 07/14/2015   MS (multiple sclerosis) ([place]) 11/26/2014   Increased body mass index 11/26/2014   HPV test positive 11/26/2014   Status post laparoscopic supracervical hysterectomy 11/26/2014   Galactorrhea 11/26/2014   Back ache 05/21/2014   Adiposity 05/21/2014   Disordered sleep 05/21/2014   Muscle spasticity 05/21/2014   Spasticity 05/21/2014   Calculus of kidney 23/46/8873   Renal colic 73/12/1681   Hypercholesteremia 08/19/2013   Hereditary and idiopathic neuropathy 08/19/2013   Hypercholesterolemia without hypertriglyceridemia 08/19/2013   Bladder infection, chronic 07/25/2012   Disorder  of bladder function 07/25/2012   Incomplete bladder emptying 07/25/2012   Microscopic hematuria 07/25/2012   Right upper quadrant pain 07/25/2012   Janna Arch, PT, DPT  07/07/2021, 11:09 AM  [place] [address] [place], [place], [address] Phone: [phone]   Fax:  [phone]  Name: Lisa Williams MRN: [id number] Date of Birth: [birth year]

## 2021-07-12 ENCOUNTER — Ambulatory Visit: Payer: PPO

## 2021-07-12 ENCOUNTER — Other Ambulatory Visit: Payer: Self-pay

## 2021-07-12 DIAGNOSIS — M6281 Muscle weakness (generalized): Secondary | ICD-10-CM

## 2021-07-12 DIAGNOSIS — R2689 Other abnormalities of gait and mobility: Secondary | ICD-10-CM

## 2021-07-12 DIAGNOSIS — R262 Difficulty in walking, not elsewhere classified: Secondary | ICD-10-CM

## 2021-07-12 DIAGNOSIS — E669 Obesity, unspecified: Secondary | ICD-10-CM | POA: Diagnosis not present

## 2021-07-12 DIAGNOSIS — I471 Supraventricular tachycardia: Secondary | ICD-10-CM | POA: Diagnosis not present

## 2021-07-12 DIAGNOSIS — G35 Multiple sclerosis: Secondary | ICD-10-CM | POA: Diagnosis not present

## 2021-07-12 DIAGNOSIS — Z0000 Encounter for general adult medical examination without abnormal findings: Secondary | ICD-10-CM | POA: Diagnosis not present

## 2021-07-12 DIAGNOSIS — Z993 Dependence on wheelchair: Secondary | ICD-10-CM | POA: Diagnosis not present

## 2021-07-12 DIAGNOSIS — I10 Essential (primary) hypertension: Secondary | ICD-10-CM | POA: Diagnosis not present

## 2021-07-12 NOTE — Therapy (Signed)
[place] MAIN [facility] SERVICES [place], [place], [address] Phone: [phone]   Fax:  [phone]  Physical Therapy Treatment  Patient Details  Name: Lisa Williams MRN: [id number] Date of Birth: Mar 16, 1961 Referring Provider (PT): Margette Fast MD   Encounter Date: 07/12/2021   PT End of Session - 07/12/21 1054     Visit Number 39    Number of Visits 44    Date for PT Re-Evaluation 07/28/21    Authorization Type 9/10 pn 2/2    PT Start Time 1015    PT Stop Time 1054    PT Time Calculation (min) 39 min    Equipment Utilized During Treatment Gait belt    Activity Tolerance Patient tolerated treatment well;Patient limited by fatigue    Behavior During Therapy [facility] for tasks assessed/performed             Past Medical History:  Diagnosis Date   Abdominal pain, right upper quadrant    Back pain    Calculus of kidney 12/09/2013   Chronic back pain    unspecified   Chronic left shoulder pain 2/69/4854   Complication of anesthesia    Functional disorder of bladder    other   Galactorrhea 11/26/2014   Chronic    Hereditary and idiopathic neuropathy 08/19/2013   HPV test positive    Hypercholesteremia 08/19/2013   Hypertension    Incomplete bladder emptying    Microscopic hematuria    MS (multiple sclerosis) (HCC)    Muscle spasticity 05/21/2014   Nonspecific findings on examination of urine    other   Osteopenia    PONV (postoperative nausea and vomiting)    Status post laparoscopic supracervical hysterectomy 11/26/2014   Tobacco user 11/26/2014   Wrist fracture     Past Surgical History:  Procedure Laterality Date   bilateral tubal ligation  1996   BREAST CYST EXCISION Left 2002   FRACTURE SURGERY     KNEE SURGERY     right   LAPAROSCOPIC SUPRACERVICAL HYSTERECTOMY  08/05/2013   ORIF WRIST FRACTURE Left 01/17/2017   Procedure: OPEN REDUCTION INTERNAL FIXATION (ORIF) WRIST FRACTURE;  Surgeon: Lovell Sheehan, MD;  Location: [facility];  Service: Orthopedics;  Laterality: Left;   RADIOLOGY WITH ANESTHESIA N/A 03/18/2020   Procedure: MRI WITH ANESTHESIA CERVICAL SPINE AND BRAIN  WITH AND WITHOUT CONTRAST;  Surgeon: Radiologist, Medication, MD;  Location: [place];  Service: Radiology;  Laterality: N/A;   TUBAL LIGATION Bilateral    VAGINAL HYSTERECTOMY  03/2006    There were no vitals filed for this visit.   Subjective Assessment - 07/12/21 1053     Subjective Patient has a doctor appointment after PT session. No falls or LOB since last session.    Pertinent History Patient is a pleasant 61 year old female who is returning to PT after MS exacerbation and hospitalization for COVID. Patient went to SNF for one month and then received home health PT for a month (8 visits).  PMH includes HTN, SVT, neurogenic bowel, MS (1995), neuropathy, hx of wrist fx, HPV, Hypercholesteremia, adiposity, osteopenia, spasticity. She wears bilateral AFOs and uses a RW and/or wheelchair for mobility. She drives an adapted car.    Limitations Lifting;Standing;Walking;House hold activities;Sitting    How long can you sit comfortably? n/a    How long can you stand comfortably? 2 minutes    How long can you walk comfortably? 40 ft in house    Currently in  Pain? No/denies                ambulate across stable and unstable surface outside. Negotiating changing surfaces from cement to sidewalk, across brick with turns and obstacles in pathway without LOB.x24 minutes with bRW and wheelchair follow. Decreased step length and foot clearance with fatigue. Two seated rest breaks required due to fatigue and overheating.   Seated: GTB adduction 12x each LE GTB abduction 12x each LE GTB row 12x with cue for scapular retraction GTB straight arm lat pull down 12x  Ice pack donned throughout session to minimize exacerbation of MS symptoms in order to perform interventions.   Pt educated throughout session about proper  posture and technique with exercises. Improved exercise technique, movement at target joints, use of target muscles after min to mod verbal, visual, tactile cues.  Patient tolerated progressive ambulation across varying surfaces in outside environment well. She does fatigue quicker with changing surfaces but remains highly motivated and able to perform longer duration ambulation with only two rest breaks. The pt will benefit from further skilled PT to improve mobility, strength, pain, gait and balance in order to decrease fall risk and increase QOL                        PT Education - 07/12/21 1054     Education provided Yes    Education Details exercise technique, body mechanics    Person(s) Educated Patient    Methods Explanation;Demonstration;Tactile cues;Verbal cues    Comprehension Verbalized understanding;Returned demonstration;Verbal cues required;Tactile cues required              PT Short Term Goals - 05/05/21 1253       PT SHORT TERM GOAL #1   Title Patient will be independent in home exercise program to improve strength/mobility for better functional independence with ADLs.    Baseline 10/4 HEP provided 11/8: HEP compliant    Time 4    Period Weeks    Status Achieved    Target Date 03/08/21               PT Long Term Goals - 06/10/21 1129       PT LONG TERM GOAL #1   Title Patient will increase FOTO score to equal to or greater than  51/100   to demonstrate statistically significant improvement in mobility and quality of life.    Baseline 10/4: 42% 11/8: 36% 12/29: 45%; 06/09/2021= 36%    Time 12    Period Weeks    Status On-going    Target Date 07/28/21      PT LONG TERM GOAL #2   Title Patient (< 60 years old) will complete five times sit to stand test in < 20 seconds indicating an increased LE strength and improved balance.    Baseline 10/4: 33.15 seconds with heavy BUE support 11/8: 30 seconds; 12/29: deferred to future session. Pt  attempted today but unable to complete d/t increase in R side LBP; 06/09/2021= 27.72 sec with UE support    Time 12    Period Weeks    Status Partially Met    Target Date 07/28/21      PT LONG TERM GOAL #3   Title Patient will increase 10 meter walk test to <20 seconds as to improve gait speed for better community ambulation and to reduce fall risk.    Baseline 10/4: 43.17 seconds with RW 11/8: 40.8 seconds; 43 sec (0.23 m/s) with  RW; 06/09/2021= 40.7 sec with RW    Time 12    Period Weeks    Status Partially Met    Target Date 07/28/21      PT LONG TERM GOAL #4   Title Patient will reduce timed up and go to <11 seconds to reduce fall risk and demonstrate improved transfer/gait ability.    Baseline 10/4: 52.66 seconds with RW 11/8: 49 seconds with RW; 12/29: 44.3 sec; 06/09/2021= 46.45 sec with use of RW    Time 12    Period Weeks    Status Partially Met    Target Date 07/28/21                   Plan - 07/12/21 1057     Clinical Impression Statement Patient tolerated progressive ambulation across varying surfaces in outside environment well. She does fatigue quicker with changing surfaces but remains highly motivated and able to perform longer duration ambulation with only two rest breaks. The pt will benefit from further skilled PT to improve mobility, strength, pain, gait and balance in order to decrease fall risk and increase QOL    Personal Factors and Comorbidities Age;Comorbidity 3+;Finances;Fitness;Past/Current Experience;Sex;Social Background;Time since onset of injury/illness/exacerbation;Transportation    Comorbidities HTN, SVT, neurogenic bowel, MS (1995), neuropathy, hx of wrist fx, HPV, Hypercholesteremia, adiposity, osteopenia, spasticity.    Examination-Activity Limitations Bathing;Bed Mobility;Bend;Carry;Continence;Dressing;Hygiene/Grooming;Stairs;Squat;Reach Overhead;Locomotion Level;Lift;Stand;Transfers;Toileting    Examination-Participation Restrictions  Church;Cleaning;Community Activity;Laundry;Volunteer;Shop;Meal Prep;Yard Work    [employer]chant [employer] officer Evolving/Moderate complexity    Rehab Potential Good    PT Frequency 2x / week    PT Duration 12 weeks    PT Treatment/Interventions ADLs/Self Care Home Management;Aquatic Therapy;Biofeedback;Cryotherapy;Electrical Stimulation;Iontophoresis 3m/ml Dexamethasone;Traction;Moist Heat;Ultrasound;DME Instruction;Gait training;Stair training;Functional mobility training;Therapeutic activities;Therapeutic exercise;Balance training;Neuromuscular re-education;Patient/family education;Orthotic Fit/Training;Wheelchair mobility training;Manual techniques;Compression bandaging;Passive range of motion;Dry needling;Energy conservation;Taping;Splinting;Vestibular    PT Next Visit Plan in // bars weight shift and LE strengthening    PT Home Exercise Plan No changes    Consulted and Agree with Plan of Care Patient             Patient will benefit from skilled therapeutic intervention in order to improve the following deficits and impairments:  Abnormal gait, Decreased activity tolerance, Decreased balance, Decreased coordination, Decreased endurance, Decreased mobility, Decreased range of motion, Decreased strength, Difficulty walking, Impaired perceived functional ability, Impaired sensation, Impaired tone, Impaired UE functional use, Improper body mechanics, Postural dysfunction, Cardiopulmonary status limiting activity, Impaired flexibility, Increased muscle spasms  Visit Diagnosis: Other abnormalities of gait and mobility  Difficulty in walking, not elsewhere classified  Muscle weakness (generalized)     Problem List Patient Active Problem List   Diagnosis Date Noted   Acute respiratory disease due to COVID-19 virus 11/21/2020   Weakness    Hypoalbuminemia due to protein-calorie malnutrition (HCC)    Neurogenic bowel    Neurogenic bladder    Labile blood pressure    Neuropathic  pain    Abscess of female pelvis    SVT (supraventricular tachycardia) (HCC)    Radial styloid tenosynovitis 03/12/2018   Wheelchair confinement 02/27/2018   Localized osteoporosis with current pathological fracture with routine healing 01/19/2017   Wrist fracture 01/16/2017   Sprain of ankle 03/23/2016   Closed fracture of lateral malleolus 03/16/2016   Health care maintenance 01/24/2016   Blood pressure elevated without history of HTN 10/25/2015   Essential hypertension 10/25/2015   Multiple [place] 10/02/2015   Chronic left shoulder pain 07/19/2015   [place] ([place] 07/14/2015  MS (multiple sclerosis) ([place]) 11/26/2014   Increased body mass index 11/26/2014   HPV test positive 11/26/2014   Status post laparoscopic supracervical hysterectomy 11/26/2014   Galactorrhea 11/26/2014   Back ache 05/21/2014   Adiposity 05/21/2014   Disordered sleep 05/21/2014   Muscle spasticity 05/21/2014   Spasticity 05/21/2014   Calculus of kidney 53/39/1792   Renal colic 17/83/7542   Hypercholesteremia 08/19/2013   Hereditary and idiopathic neuropathy 08/19/2013   Hypercholesterolemia without hypertriglyceridemia 08/19/2013   Bladder infection, chronic 07/25/2012   Disorder of bladder function 07/25/2012   Incomplete bladder emptying 07/25/2012   Microscopic hematuria 07/25/2012   Right upper quadrant pain 07/25/2012    Janna Arch, PT, DPT  07/12/2021, 10:58 AM  [place] MAIN [facility] SERVICES [address] [place], [place], [address] Phone: [phone]   Fax:  [phone]  Name: Lundynn Cohoon Dungee MRN: [id number] Date of Birth: Jun 26, 1960

## 2021-07-14 ENCOUNTER — Other Ambulatory Visit: Payer: Self-pay

## 2021-07-14 ENCOUNTER — Ambulatory Visit: Payer: PPO

## 2021-07-14 DIAGNOSIS — R2681 Unsteadiness on feet: Secondary | ICD-10-CM

## 2021-07-14 DIAGNOSIS — R2689 Other abnormalities of gait and mobility: Secondary | ICD-10-CM

## 2021-07-14 DIAGNOSIS — M6281 Muscle weakness (generalized): Secondary | ICD-10-CM

## 2021-07-14 NOTE — Therapy (Signed)
[place] MAIN [facility] SERVICES [address] [place], [place], [address] Phone: [phone]   Fax:  [phone]  Physical Therapy Treatment / Physical Therapy Progress Note   Dates of reporting period  06/09/21   to   07/14/21   Patient Details  Name: Lisa Williams MRN: [id number] Date of Birth: December 19, 1960 Referring Provider (PT): Margette Fast MD   Encounter Date: 07/14/2021   PT End of Session - 07/14/21 1245     Visit Number 40    Number of Visits 44    Date for PT Re-Evaluation 07/28/21    Authorization Type 10/10 pn 2/2; next session 1/10 PN 3/9    PT Start Time 1015    PT Stop Time 1059    PT Time Calculation (min) 44 min    Equipment Utilized During Treatment Gait belt    Activity Tolerance Patient tolerated treatment well;Patient limited by fatigue    Behavior During Therapy [facility] for tasks assessed/performed             Past Medical History:  Diagnosis Date   Abdominal pain, right upper quadrant    Back pain    Calculus of kidney 12/09/2013   Chronic back pain    unspecified   Chronic left shoulder pain 11/13/6281   Complication of anesthesia    Functional disorder of bladder    other   Galactorrhea 11/26/2014   Chronic    Hereditary and idiopathic neuropathy 08/19/2013   HPV test positive    Hypercholesteremia 08/19/2013   Hypertension    Incomplete bladder emptying    Microscopic hematuria    MS (multiple sclerosis) (HCC)    Muscle spasticity 05/21/2014   Nonspecific findings on examination of urine    other   Osteopenia    PONV (postoperative nausea and vomiting)    Status post laparoscopic supracervical hysterectomy 11/26/2014   Tobacco user 11/26/2014   Wrist fracture     Past Surgical History:  Procedure Laterality Date   bilateral tubal ligation  1996   BREAST CYST EXCISION Left 2002   FRACTURE SURGERY     KNEE SURGERY     right   LAPAROSCOPIC SUPRACERVICAL HYSTERECTOMY  08/05/2013   ORIF WRIST  FRACTURE Left 01/17/2017   Procedure: OPEN REDUCTION INTERNAL FIXATION (ORIF) WRIST FRACTURE;  Surgeon: Lovell Sheehan, MD;  Location: [facility];  Service: Orthopedics;  Laterality: Left;   RADIOLOGY WITH ANESTHESIA N/A 03/18/2020   Procedure: MRI WITH ANESTHESIA CERVICAL SPINE AND BRAIN  WITH AND WITHOUT CONTRAST;  Surgeon: Radiologist, Medication, MD;  Location: [place];  Service: Radiology;  Laterality: N/A;   TUBAL LIGATION Bilateral    VAGINAL HYSTERECTOMY  03/2006    There were no vitals filed for this visit.   Subjective Assessment - 07/14/21 1244     Subjective Patient reports no falls or LOB since last session. Is feeling really good    Pertinent History Patient is a pleasant 61 year old female who is returning to PT after MS exacerbation and hospitalization for COVID. Patient went to SNF for one month and then received home health PT for a month (8 visits).  PMH includes HTN, SVT, neurogenic bowel, MS (1995), neuropathy, hx of wrist fx, HPV, Hypercholesteremia, adiposity, osteopenia, spasticity. She wears bilateral AFOs and uses a RW and/or wheelchair for mobility. She drives an adapted car.    Limitations Lifting;Standing;Walking;House hold activities;Sitting    How long can you sit comfortably? n/a    How long can  you stand comfortably? 2 minutes    How long can you walk comfortably? 40 ft in house    Currently in Pain? No/denies                 Goals FOTO: 36%  5x STS: 37.74 seconds  10 MWT: 38.8 seconds TUG: 39.9 seconds  Treatment:  GTB adduction 12x each LE GTB abduction 12x each LE GTB row 12x with cue for scapular retraction  4lb dumbells: -rows 15x -bicep curls 15x -overhead raises 10x  Ice pack donned throughout session to minimize exacerbation of MS symptoms in order to perform interventions.    Pt educated throughout session about proper posture and technique with exercises. Improved exercise technique, movement at target joints, use of target  muscles after min to mod verbal, visual, tactile cues.  Patient's condition has the potential to improve in response to therapy. Maximum improvement is yet to be obtained. The anticipated improvement is attainable and reasonable in a generally predictable time.  Patient reports she is improving with her mobility and function.    Patient demonstrated improved mobility with improved velocity during TUG and 10 MWT. Her 5x STS was slower however patient was fatigued by that test. Patient remains highly motivated and an excellent candidate for physical therapy. Strengthening interventions tolerated well at end of session with no pain and/or instability. Patient's condition has the potential to improve in response to therapy. Maximum improvement is yet to be obtained. The anticipated improvement is attainable and reasonable in a generally predictable time. The pt will benefit from further skilled PT to improve mobility, strength, pain, gait and balance in order to decrease fall risk and increase QOL                     PT Education - 07/14/21 1244     Education provided Yes    Education Details goals, progress note    Person(s) Educated Patient    Methods Explanation;Demonstration;Tactile cues;Verbal cues    Comprehension Verbalized understanding;Returned demonstration;Verbal cues required;Tactile cues required              PT Short Term Goals - 05/05/21 1253       PT SHORT TERM GOAL #1   Title Patient will be independent in home exercise program to improve strength/mobility for better functional independence with ADLs.    Baseline 10/4 HEP provided 11/8: HEP compliant    Time 4    Period Weeks    Status Achieved    Target Date 03/08/21               PT Long Term Goals - 07/14/21 0001       PT LONG TERM GOAL #1   Title Patient will increase FOTO score to equal to or greater than  51/100   to demonstrate statistically significant improvement in mobility and  quality of life.    Baseline 10/4: 42% 11/8: 36% 12/29: 45%; 06/09/2021= 36% 39: 36%    Time 12    Period Weeks    Status On-going    Target Date 07/28/21      PT LONG TERM GOAL #2   Title Patient (< 27 years old) will complete five times sit to stand test in < 20 seconds indicating an increased LE strength and improved balance.    Baseline 10/4: 33.15 seconds with heavy BUE support 11/8: 30 seconds; 12/29: deferred to future session. Pt attempted today but unable to complete d/t increase in R side  LBP; 06/09/2021= 27.72 sec with UE support 3/9: 37.74 seconds    Time 12    Period Weeks    Status Partially Met    Target Date 07/28/21      PT LONG TERM GOAL #3   Title Patient will increase 10 meter walk test to <20 seconds as to improve gait speed for better community ambulation and to reduce fall risk.    Baseline 10/4: 43.17 seconds with RW 11/8: 40.8 seconds; 43 sec (0.23 m/s) with RW; 06/09/2021= 40.7 sec with RW 3/9: 38.8 seconds    Time 12    Period Weeks    Status Partially Met    Target Date 07/28/21      PT LONG TERM GOAL #4   Title Patient will reduce timed up and go to <11 seconds to reduce fall risk and demonstrate improved transfer/gait ability.    Baseline 10/4: 52.66 seconds with RW 11/8: 49 seconds with RW; 12/29: 44.3 sec; 06/09/2021= 46.45 sec with use of RW 3/9: 39.9    Time 12    Period Weeks    Status Partially Met    Target Date 07/28/21                   Plan - 07/14/21 1248     Clinical Impression Statement Patient demonstrated improved mobility with improved velocity during TUG and 10 MWT. Her 5x STS was slower however patient was fatigued by that test. Patient remains highly motivated and an excellent candidate for physical therapy. Strengthening interventions tolerated well at end of session with no pain and/or instability. Patient's condition has the potential to improve in response to therapy. Maximum improvement is yet to be obtained. The anticipated  improvement is attainable and reasonable in a generally predictable time. The pt will benefit from further skilled PT to improve mobility, strength, pain, gait and balance in order to decrease fall risk and increase QOL    Personal Factors and Comorbidities Age;Comorbidity 3+;Finances;Fitness;Past/Current Experience;Sex;Social Background;Time since onset of injury/illness/exacerbation;Transportation    Comorbidities HTN, SVT, neurogenic bowel, MS (1995), neuropathy, hx of wrist fx, HPV, Hypercholesteremia, adiposity, osteopenia, spasticity.    Examination-Activity Limitations Bathing;Bed Mobility;Bend;Carry;Continence;Dressing;Hygiene/Grooming;Stairs;Squat;Reach Overhead;Locomotion Level;Lift;Stand;Transfers;Toileting    Examination-Participation Restrictions Church;Cleaning;Community Activity;Laundry;Volunteer;Shop;Meal Prep;Yard Work    Merchant [employer] officer Evolving/Moderate complexity    Rehab Potential Good    PT Frequency 2x / week    PT Duration 12 weeks    PT Treatment/Interventions ADLs/Self Care Home Management;Aquatic Therapy;Biofeedback;Cryotherapy;Electrical Stimulation;Iontophoresis 34m/ml Dexamethasone;Traction;Moist Heat;Ultrasound;DME Instruction;Gait training;Stair training;Functional mobility training;Therapeutic activities;Therapeutic exercise;Balance training;Neuromuscular re-education;Patient/family education;Orthotic Fit/Training;Wheelchair mobility training;Manual techniques;Compression bandaging;Passive range of motion;Dry needling;Energy conservation;Taping;Splinting;Vestibular    PT Next Visit Plan in // bars weight shift and LE strengthening    PT Home Exercise Plan No changes    Consulted and Agree with Plan of Care Patient             Patient will benefit from skilled therapeutic intervention in order to improve the following deficits and impairments:  Abnormal gait, Decreased activity tolerance, Decreased balance, Decreased coordination, Decreased  endurance, Decreased mobility, Decreased range of motion, Decreased strength, Difficulty walking, Impaired perceived functional ability, Impaired sensation, Impaired tone, Impaired UE functional use, Improper body mechanics, Postural dysfunction, Cardiopulmonary status limiting activity, Impaired flexibility, Increased muscle spasms  Visit Diagnosis: Other abnormalities of gait and mobility  Muscle weakness (generalized)  Unsteadiness on feet     Problem List Patient Active Problem List   Diagnosis Date Noted   Acute respiratory disease due to COVID-19 virus 11/21/2020  Weakness    Hypoalbuminemia due to protein-calorie malnutrition (HCC)    Neurogenic bowel    Neurogenic bladder    Labile blood pressure    Neuropathic pain    Abscess of female pelvis    SVT (supraventricular tachycardia) (HCC)    Radial styloid tenosynovitis 03/12/2018   Wheelchair confinement 02/27/2018   Localized osteoporosis with current pathological fracture with routine healing 01/19/2017   Wrist fracture 01/16/2017   Sprain of ankle 03/23/2016   Closed fracture of lateral malleolus 03/16/2016   Health care maintenance 01/24/2016   Blood pressure elevated without history of HTN 10/25/2015   Essential hypertension 10/25/2015   Multiple sclerosis ([place]) 10/02/2015   Chronic left shoulder pain 07/19/2015   Multiple sclerosis exacerbation ([place]) 07/14/2015   MS (multiple sclerosis) ([place]) 11/26/2014   Increased body mass index 11/26/2014   HPV test positive 11/26/2014   Status post laparoscopic supracervical hysterectomy 11/26/2014   Galactorrhea 11/26/2014   Back ache 05/21/2014   Adiposity 05/21/2014   Disordered sleep 05/21/2014   Muscle spasticity 05/21/2014   Spasticity 05/21/2014   Calculus of kidney 14/84/0397   Renal colic 95/36/9223   Hypercholesteremia 08/19/2013   Hereditary and idiopathic neuropathy 08/19/2013   Hypercholesterolemia without hypertriglyceridemia 08/19/2013   Bladder  infection, chronic 07/25/2012   Disorder of bladder function 07/25/2012   Incomplete bladder emptying 07/25/2012   Microscopic hematuria 07/25/2012   Right upper quadrant pain 07/25/2012    Janna Arch, PT, DPT  07/14/2021, 12:49 PM  [place] MAIN [facility] SERVICES [address] [place], [place], [address] Phone: [phone]   Fax:  [phone]  Name: Lisa Williams MRN: [id number] Date of Birth: 30-Jul-1960

## 2021-07-19 ENCOUNTER — Other Ambulatory Visit: Payer: Self-pay

## 2021-07-19 ENCOUNTER — Ambulatory Visit: Payer: PPO

## 2021-07-19 DIAGNOSIS — M6281 Muscle weakness (generalized): Secondary | ICD-10-CM

## 2021-07-19 DIAGNOSIS — R2681 Unsteadiness on feet: Secondary | ICD-10-CM

## 2021-07-19 DIAGNOSIS — R2689 Other abnormalities of gait and mobility: Secondary | ICD-10-CM | POA: Diagnosis not present

## 2021-07-19 NOTE — Therapy (Signed)
[facility] ?[place], [place], [address] ?Phone: [phone]   Fax:  [phone] ? ?Physical Therapy Treatment ? ?Patient Details  ?Name: Lisa Williams ?MRN: [id number] ?Date of Birth: 1961/03/06 ?Referring Provider (PT): Margette Fast MD ? ? ?Encounter Date: 07/19/2021 ? ? PT End of Session - 07/19/21 1007   ? ? Visit Number 41   ? Number of Visits 44   ? Date for PT Re-Evaluation 07/28/21   ? Authorization Type 1/10 PN 3/9   ? PT Start Time 1015   ? PT Stop Time 1058   ? PT Time Calculation (min) 43 min   ? Equipment Utilized During Treatment Gait belt   ? Activity Tolerance Patient tolerated treatment well;Patient limited by fatigue   ? Behavior During Therapy [facility] for tasks assessed/performed   ? ?  ?  ? ?  ? ? ?Past Medical History:  ?Diagnosis Date  ? Abdominal pain, right upper quadrant   ? Back pain   ? Calculus of kidney 12/09/2013  ? Chronic back pain   ? unspecified  ? Chronic left shoulder pain 07/19/2015  ? Complication of anesthesia   ? Functional disorder of bladder   ? other  ? Galactorrhea 11/26/2014  ? Chronic   ? Hereditary and idiopathic neuropathy 08/19/2013  ? HPV test positive   ? Hypercholesteremia 08/19/2013  ? Hypertension   ? Incomplete bladder emptying   ? Microscopic hematuria   ? MS (multiple sclerosis) ([place])   ? Muscle spasticity 05/21/2014  ? Nonspecific findings on examination of urine   ? other  ? Osteopenia   ? PONV (postoperative nausea and vomiting)   ? Status post laparoscopic supracervical hysterectomy 11/26/2014  ? Tobacco user 11/26/2014  ? Wrist fracture   ? ? ?Past Surgical History:  ?Procedure Laterality Date  ? bilateral tubal ligation  1996  ? BREAST CYST EXCISION Left 2002  ? FRACTURE SURGERY    ? KNEE SURGERY    ? right  ? LAPAROSCOPIC SUPRACERVICAL HYSTERECTOMY  08/05/2013  ? ORIF WRIST FRACTURE Left 01/17/2017  ? Procedure: OPEN REDUCTION INTERNAL FIXATION (ORIF) WRIST FRACTURE;  Surgeon: Lovell Sheehan, MD;  Location: [facility];  Service: Orthopedics;  Laterality: Left;  ? RADIOLOGY WITH ANESTHESIA N/A 03/18/2020  ? Procedure: MRI WITH ANESTHESIA CERVICAL SPINE AND BRAIN  WITH AND WITHOUT CONTRAST;  Surgeon: Radiologist, Medication, MD;  Location: [place];  Service: Radiology;  Laterality: N/A;  ? TUBAL LIGATION Bilateral   ? VAGINAL HYSTERECTOMY  03/2006  ? ? ?There were no vitals filed for this visit. ? ? Subjective Assessment - 07/19/21 1222   ? ? Subjective Patient rerports no falls or LOB. Had a busy weekend due to mother going to the ED.   ? Pertinent History Patient is a pleasant 61 year old female who is returning to PT after MS exacerbation and hospitalization for COVID. Patient went to SNF for one month and then received home health PT for a month (8 visits).  PMH includes HTN, SVT, neurogenic bowel, MS (1995), neuropathy, hx of wrist fx, HPV, Hypercholesteremia, adiposity, osteopenia, spasticity. She wears bilateral AFOs and uses a RW and/or wheelchair for mobility. She drives an adapted car.   ? Limitations Lifting;Standing;Walking;House hold activities;Sitting   ? How long can you sit comfortably? n/a   ? How long can you stand comfortably? 2 minutes   ? How long can you walk comfortably? 40 ft in house   ?  Currently in Pain? No/denies   ? ?  ?  ? ?  ? ? ? ? ? ? ? ?  ?INTERVENTIONS ?  ?Standing in // bars: ?standing weight shift 10x each direction, VC to slow movement ?-terminated due to back spasm ? ?Seated: ?Seated boxing: cues for sequencing, body mechanics, and pace/rhythm for functional contraction and timing of muscle recruitment: ?-Cross body punches to mitts on PT hands with no back support for focused core stabilization with crossing midline 2x 60 seconds  ?-Cross body punch with secondary combination of elbow for full cross body rotation to PT mitt for trunk stability, coordination, and cardiovascular challenge x 60 seconds ?-Upper cut to mitts in PT hands for core activation without  back support with perturbations 60 seconds ?  ?GTB abduction 12x ?GTB adduction 12x ?GTB overhead Y raise 12x ? ?Windmills 12x each direction ?Forward trunk lean and extension 10x with PT overpressure ? ?STM with implementation of effleurage and pettrisage to bilateral lumbar paraspinals and quadratus lumborum musculature x 9 minutes for spasm and pain reduction .  ? ?Ice pack donned throughout session to minimize exacerbation of MS symptoms in order to perform interventions.  ?  ?Pt educated throughout session about proper posture and technique with exercises. Improved exercise technique, movement at target joints, use of target muscles after min to mod verbal, visual, tactile cues ? ?Patient session initially impacted by back spasms resulting in limited ability to stand. Patient is highly motivated throughout session despite pain and has no pain by end of session s/p manual technique. The pt will benefit from further skilled PT to improve mobility, strength, pain, gait and balance in order to decrease fall risk and increase QOL ? ? ? ? ? ? ? ? ? ? ? ? ? ? ? ? ? ? ? PT Education - 07/19/21 1007   ? ? Education provided Yes   ? Education Details exercise technique, body mechanics   ? Person(s) Educated Patient   ? Methods Explanation;Demonstration;Tactile cues;Verbal cues   ? Comprehension Verbalized understanding;Returned demonstration;Verbal cues required;Tactile cues required   ? ?  ?  ? ?  ? ? ? PT Short Term Goals - 05/05/21 1253   ? ?  ? PT SHORT TERM GOAL #1  ? Title Patient will be independent in home exercise program to improve strength/mobility for better functional independence with ADLs.   ? Baseline 10/4 HEP provided 11/8: HEP compliant   ? Time 4   ? Period Weeks   ? Status Achieved   ? Target Date 03/08/21   ? ?  ?  ? ?  ? ? ? ? PT Long Term Goals - 07/14/21 0001   ? ?  ? PT LONG TERM GOAL #1  ? Title Patient will increase FOTO score to equal to or greater than  51/100   to demonstrate statistically  significant improvement in mobility and quality of life.   ? Baseline 10/4: 42% 11/8: 36% 12/29: 45%; 06/09/2021= 36% 39: 36%   ? Time 12   ? Period Weeks   ? Status On-going   ? Target Date 07/28/21   ?  ? PT LONG TERM GOAL #2  ? Title Patient (< 15 years old) will complete five times sit to stand test in < 20 seconds indicating an increased LE strength and improved balance.   ? Baseline 10/4: 33.15 seconds with heavy BUE support 11/8: 30 seconds; 12/29: deferred to future session. Pt attempted today but unable to complete d/t  increase in R side LBP; 06/09/2021= 27.72 sec with UE support 3/9: 37.74 seconds   ? Time 12   ? Period Weeks   ? Status Partially Met   ? Target Date 07/28/21   ?  ? PT LONG TERM GOAL #3  ? Title Patient will increase 10 meter walk test to <20 seconds as to improve gait speed for better community ambulation and to reduce fall risk.   ? Baseline 10/4: 43.17 seconds with RW 11/8: 40.8 seconds; 43 sec (0.23 m/s) with RW; 06/09/2021= 40.7 sec with RW 3/9: 38.8 seconds   ? Time 12   ? Period Weeks   ? Status Partially Met   ? Target Date 07/28/21   ?  ? PT LONG TERM GOAL #4  ? Title Patient will reduce timed up and go to <11 seconds to reduce fall risk and demonstrate improved transfer/gait ability.   ? Baseline 10/4: 52.66 seconds with RW 11/8: 49 seconds with RW; 12/29: 44.3 sec; 06/09/2021= 46.45 sec with use of RW 3/9: 39.9   ? Time 12   ? Period Weeks   ? Status Partially Met   ? Target Date 07/28/21   ? ?  ?  ? ?  ? ? ? ? ? ? ? ? Plan - 07/19/21 1223   ? ? Clinical Impression Statement Patient session initially impacted by back spasms resulting in limited ability to stand. Patient is highly motivated throughout session despite pain and has no pain by end of session s/p manual technique. The pt will benefit from further skilled PT to improve mobility, strength, pain, gait and balance in order to decrease fall risk and increase QOL   ? Personal Factors and Comorbidities Age;Comorbidity  3+;Finances;Fitness;Past/Current Experience;Sex;Social Background;Time since onset of injury/illness/exacerbation;Transportation   ? Comorbidities HTN, SVT, neurogenic bowel, MS (1995), neuropathy, hx of wrist fx, HPV,

## 2021-07-21 ENCOUNTER — Ambulatory Visit: Payer: PPO

## 2021-07-21 ENCOUNTER — Other Ambulatory Visit: Payer: Self-pay

## 2021-07-21 DIAGNOSIS — R2689 Other abnormalities of gait and mobility: Secondary | ICD-10-CM | POA: Diagnosis not present

## 2021-07-21 NOTE — Therapy (Signed)
[place] [facility] ?[place], [place], [address] ?Phone: [phone]   Fax:  [phone] ? ?Physical Therapy Treatment ? ?Patient Details  ?Name: Lisa Williams ?MRN: [id number] ?Date of Birth: Sep 28, 1960 ?Referring Provider (PT): Margette Fast MD ? ? ?Encounter Date: 07/21/2021 ? ? PT End of Session - 07/21/21 0958   ? ? Visit Number 42   ? Number of Visits 44   ? Date for PT Re-Evaluation 07/28/21   ? Authorization Type 2/10 PN 3/9   ? PT Start Time 1015   ? PT Stop Time 1058   ? PT Time Calculation (min) 43 min   ? Equipment Utilized During Treatment Gait belt   ? Activity Tolerance Patient tolerated treatment well;Patient limited by fatigue   ? Behavior During Therapy [facility] for tasks assessed/performed   ? ?  ?  ? ?  ? ? ?Past Medical History:  ?Diagnosis Date  ? Abdominal pain, right upper quadrant   ? Back pain   ? Calculus of kidney 12/09/2013  ? Chronic back pain   ? unspecified  ? Chronic left shoulder pain 07/19/2015  ? Complication of anesthesia   ? Functional disorder of bladder   ? other  ? Galactorrhea 11/26/2014  ? Chronic   ? Hereditary and idiopathic neuropathy 08/19/2013  ? HPV test positive   ? Hypercholesteremia 08/19/2013  ? Hypertension   ? Incomplete bladder emptying   ? Microscopic hematuria   ? MS (multiple sclerosis) ([place])   ? Muscle spasticity 05/21/2014  ? Nonspecific findings on examination of urine   ? other  ? Osteopenia   ? PONV (postoperative nausea and vomiting)   ? Status post laparoscopic supracervical hysterectomy 11/26/2014  ? Tobacco user 11/26/2014  ? Wrist fracture   ? ? ?Past Surgical History:  ?Procedure Laterality Date  ? bilateral tubal ligation  1996  ? BREAST CYST EXCISION Left 2002  ? FRACTURE SURGERY    ? KNEE SURGERY    ? right  ? LAPAROSCOPIC SUPRACERVICAL HYSTERECTOMY  08/05/2013  ? ORIF WRIST FRACTURE Left 01/17/2017  ? Procedure: OPEN REDUCTION INTERNAL FIXATION (ORIF) WRIST FRACTURE;  Surgeon: Lovell Sheehan, MD;  Location: [facility];  Service: Orthopedics;  Laterality: Left;  ? RADIOLOGY WITH ANESTHESIA N/A 03/18/2020  ? Procedure: MRI WITH ANESTHESIA CERVICAL SPINE AND BRAIN  WITH AND WITHOUT CONTRAST;  Surgeon: Radiologist, Medication, MD;  Location: [place];  Service: Radiology;  Laterality: N/A;  ? TUBAL LIGATION Bilateral   ? VAGINAL HYSTERECTOMY  03/2006  ? ? ?There were no vitals filed for this visit. ? ? Subjective Assessment - 07/21/21 1101   ? ? Subjective Patient reports she is feeling good today. No falls or LOB since last session. Is going to get her hair done after her PT session today.   ? Pertinent History Patient is a pleasant 61 year old female who is returning to PT after MS exacerbation and hospitalization for COVID. Patient went to SNF for one month and then received home health PT for a month (8 visits).  PMH includes HTN, SVT, neurogenic bowel, MS (1995), neuropathy, hx of wrist fx, HPV, Hypercholesteremia, adiposity, osteopenia, spasticity. She wears bilateral AFOs and uses a RW and/or wheelchair for mobility. She drives an adapted car.   ? Limitations Lifting;Standing;Walking;House hold activities;Sitting   ? How long can you sit comfortably? n/a   ? How long can you stand comfortably? 2 minutes   ? How long can you  walk comfortably? 40 ft in house   ? Currently in Pain? No/denies   ? ?  ?  ? ?  ? ? ? ? ? ?  ?  ?Standing/walking with RW ?-walking with bRW, CGA and wheelchair follow, use of ice. Total of 120 ft, 1 rest break. Patient has decreased foot clearance bilaterally, R>L as she fatigues. Heavy BUE support required as patient fatigued. She is able to negotiate changing floor surfaces without LOB ?  ?  ?Seated: ?  ?Seated on dynadisc:  ? -static sit arms crossed 30 seconds ?-anterior posterior trunk lean 10x each direction ?-medial lateral trunk lean outside BOS 10x each LE ?-GTB row 10x ? ?GTB adduction 15x each LE ?GTB abduction 15xeach LE ? ?  ?  ?Ice pack donned throughout session  to minimize exacerbation of MS symptoms in order to perform interventions ?  ?  ?Pt educated throughout session about proper posture and technique with exercises. Improved exercise technique, movement at target joints, use of target muscles after min to mod verbal, visual, tactile cues. ?  ? Patient tolerates improved duration of ambulation with only one rest break. She is able to stabilize herself with her walker while ambulating despite people walking around and in front of her. Patient is highly motivated throughout physical therapy session. The pt will benefit from further skilled PT to improve mobility, strength, pain, gait and balance in order to decrease fall risk and increase QOL ? ? ? ? ? ? ? ? ? ? ? ? ? ? ? ? ? ? ? ? ? ? ? PT Education - 07/21/21 0958   ? ? Education provided Yes   ? Education Details exercise technique, body mechanics   ? Person(s) Educated Patient   ? Methods Explanation;Demonstration;Tactile cues;Verbal cues   ? Comprehension Verbalized understanding;Other (comment);Returned demonstration;Verbal cues required;Tactile cues required   ? ?  ?  ? ?  ? ? ? PT Short Term Goals - 05/05/21 1253   ? ?  ? PT SHORT TERM GOAL #1  ? Title Patient will be independent in home exercise program to improve strength/mobility for better functional independence with ADLs.   ? Baseline 10/4 HEP provided 11/8: HEP compliant   ? Time 4   ? Period Weeks   ? Status Achieved   ? Target Date 03/08/21   ? ?  ?  ? ?  ? ? ? ? PT Long Term Goals - 07/14/21 0001   ? ?  ? PT LONG TERM GOAL #1  ? Title Patient will increase FOTO score to equal to or greater than  51/100   to demonstrate statistically significant improvement in mobility and quality of life.   ? Baseline 10/4: 42% 11/8: 36% 12/29: 45%; 06/09/2021= 36% 39: 36%   ? Time 12   ? Period Weeks   ? Status On-going   ? Target Date 07/28/21   ?  ? PT LONG TERM GOAL #2  ? Title Patient (< 72 years old) will complete five times sit to stand test in < 20 seconds  indicating an increased LE strength and improved balance.   ? Baseline 10/4: 33.15 seconds with heavy BUE support 11/8: 30 seconds; 12/29: deferred to future session. Pt attempted today but unable to complete d/t increase in R side LBP; 06/09/2021= 27.72 sec with UE support 3/9: 37.74 seconds   ? Time 12   ? Period Weeks   ? Status Partially Met   ? Target Date 07/28/21   ?  ?  PT LONG TERM GOAL #3  ? Title Patient will increase 10 meter walk test to <20 seconds as to improve gait speed for better community ambulation and to reduce fall risk.   ? Baseline 10/4: 43.17 seconds with RW 11/8: 40.8 seconds; 43 sec (0.23 m/s) with RW; 06/09/2021= 40.7 sec with RW 3/9: 38.8 seconds   ? Time 12   ? Period Weeks   ? Status Partially Met   ? Target Date 07/28/21   ?  ? PT LONG TERM GOAL #4  ? Title Patient will reduce timed up and go to <11 seconds to reduce fall risk and demonstrate improved transfer/gait ability.   ? Baseline 10/4: 52.66 seconds with RW 11/8: 49 seconds with RW; 12/29: 44.3 sec; 06/09/2021= 46.45 sec with use of RW 3/9: 39.9   ? Time 12   ? Period Weeks   ? Status Partially Met   ? Target Date 07/28/21   ? ?  ?  ? ?  ? ? ? ? ? ? ? ? Plan - 07/21/21 1101   ? ? Clinical Impression Statement Patient tolerates improved duration of ambulation with only one rest break. She is able to stabilize herself with her walker while ambulating despite people walking around and in front of her. Patient is highly motivated throughout physical therapy session. The pt will benefit from further skilled PT to improve mobility, strength, pain, gait and balance in order to decrease fall risk and increase QOL   ? Personal Factors and Comorbidities Age;Comorbidity 3+;Finances;Fitness;Past/Current Experience;Sex;Social Background;Time since onset of injury/illness/exacerbation;Transportation   ? Comorbidities HTN, SVT, neurogenic bowel, MS (1995), neuropathy, hx of wrist fx, HPV, Hypercholesteremia, adiposity, osteopenia, spasticity.   ?  Examination-Activity Limitations Bathing;Bed Mobility;Bend;Carry;Continence;Dressing;Hygiene/Grooming;Stairs;Squat;Reach Overhead;Locomotion Level;Lift;Stand;Transfers;Toileting   ? Examination-Participation R

## 2021-07-26 ENCOUNTER — Ambulatory Visit: Payer: PPO

## 2021-07-26 ENCOUNTER — Other Ambulatory Visit: Payer: Self-pay

## 2021-07-26 DIAGNOSIS — M6281 Muscle weakness (generalized): Secondary | ICD-10-CM

## 2021-07-26 DIAGNOSIS — R2689 Other abnormalities of gait and mobility: Secondary | ICD-10-CM | POA: Diagnosis not present

## 2021-07-26 DIAGNOSIS — R2681 Unsteadiness on feet: Secondary | ICD-10-CM

## 2021-07-26 NOTE — Therapy (Signed)
[facility] ?[place], [place], [address] ?Phone: [phone]   Fax:  [phone] ? ?Physical Therapy Treatment ? ?Patient Details  ?Name: Lisa Williams ?MRN: [id number] ?Date of Birth: [birth year] ?Referring Provider (PT): Margette Fast MD ? ? ?Encounter Date: 07/26/2021 ? ? PT End of Session - 07/26/21 1234   ? ? Visit Number 22   ? Number of Visits 67   ? Date for PT Re-Evaluation 10/18/21   ? Authorization Type 3/10 PN 3/9   ? PT Start Time 1015   ? PT Stop Time 1059   ? PT Time Calculation (min) 44 min   ? Equipment Utilized During Treatment Gait belt   ? Activity Tolerance Patient tolerated treatment well;Patient limited by fatigue   ? Behavior During Therapy [facility] for tasks assessed/performed   ? ?  ?  ? ?  ? ? ?Past Medical History:  ?Diagnosis Date  ? Abdominal pain, right upper quadrant   ? Back pain   ? Calculus of kidney 12/09/2013  ? Chronic back pain   ? unspecified  ? Chronic left shoulder pain 07/19/2015  ? Complication of anesthesia   ? Functional disorder of bladder   ? other  ? Galactorrhea 11/26/2014  ? Chronic   ? Hereditary and idiopathic neuropathy 08/19/2013  ? HPV test positive   ? Hypercholesteremia 08/19/2013  ? Hypertension   ? Incomplete bladder emptying   ? Microscopic hematuria   ? MS (multiple sclerosis) ([place])   ? Muscle spasticity 05/21/2014  ? Nonspecific findings on examination of urine   ? other  ? Osteopenia   ? PONV (postoperative nausea and vomiting)   ? Status post laparoscopic supracervical hysterectomy 11/26/2014  ? Tobacco user 11/26/2014  ? Wrist fracture   ? ? ?Past Surgical History:  ?Procedure Laterality Date  ? bilateral tubal ligation  1996  ? BREAST CYST EXCISION Left 2002  ? FRACTURE SURGERY    ? KNEE SURGERY    ? right  ? LAPAROSCOPIC SUPRACERVICAL HYSTERECTOMY  08/05/2013  ? ORIF WRIST FRACTURE Left 01/17/2017  ? Procedure: OPEN REDUCTION INTERNAL FIXATION (ORIF) WRIST FRACTURE;  Surgeon: Lovell Sheehan, MD;  Location: [facility];  Service: Orthopedics;  Laterality: Left;  ? RADIOLOGY WITH ANESTHESIA N/A 03/18/2020  ? Procedure: MRI WITH ANESTHESIA CERVICAL SPINE AND BRAIN  WITH AND WITHOUT CONTRAST;  Surgeon: Radiologist, Medication, MD;  Location: [place];  Service: Radiology;  Laterality: N/A;  ? TUBAL LIGATION Bilateral   ? VAGINAL HYSTERECTOMY  03/2006  ? ? ?There were no vitals filed for this visit. ? ? Subjective Assessment - 07/26/21 1234   ? ? Subjective Patient had a good day. No falls or LOB since last session. Has been compliant with HEP.   ? Pertinent History Patient is a pleasant 61 year old female who is returning to PT after MS exacerbation and hospitalization for COVID. Patient went to SNF for one month and then received home health PT for a month (8 visits).  PMH includes HTN, SVT, neurogenic bowel, MS (1995), neuropathy, hx of wrist fx, HPV, Hypercholesteremia, adiposity, osteopenia, spasticity. She wears bilateral AFOs and uses a RW and/or wheelchair for mobility. She drives an adapted car.   ? Limitations Lifting;Standing;Walking;House hold activities;Sitting   ? How long can you sit comfortably? n/a   ? How long can you stand comfortably? 2 minutes   ? How long can you walk comfortably? 40 ft in house   ?  Currently in Pain? No/denies   ? ?  ?  ? ?  ? ? ? ? ? ? ?RECERT ?Goals performed previously  ?5x STS=29 seconds with BUE support ? ?New goal: ?Transfer from shower seat independently.  ?Get into bed without assistance  ?  ?  ?Standing/walking with RW ?-walking with bRW, CGA and wheelchair follow, use of ice. Total of 120 ft, 2 rest break. Patient has decreased foot clearance bilaterally, R>L as she fatigues. Heavy BUE support required as patient fatigued. She is able to negotiate changing floor surfaces without LOB ?  ?  ?Seated: ?  ?Weighted ball (3000 gr) ?-chest press 12x ?-overhead press 10x  ?-rainbow arc for core activation 10x ?-bicep curl 12x  ? ?Hamstring lengthening stretch 60  seconds each LE ? ?Ice pack donned throughout session to minimize exacerbation of MS symptoms in order to perform interventions ?  ?  ?Pt educated throughout session about proper posture and technique with exercises. Improved exercise technique, movement at target joints, use of target muscles after min to mod verbal, visual, tactile cues ? ? ? ? ? ?Patient has made significant progress towards functional goals at this time. New goals addressing mobility in regards to bathing and bed transfers introduced to increase patient independence. Patient is highly motivated and will continue progressing with functional mobility and ambulation. Patient's goals addressed 07/14/21 please refer to this note for further details with additional 5xSTS performed today's session. The pt will benefit from further skilled PT to improve mobility, strength, pain, gait and balance in order to decrease fall risk and increase QOL ? ? ? ? ? ? ? ? ? ? ? ? ? ? ? ? PT Education - 07/26/21 1234   ? ? Education provided Yes   ? Education Details goals, POC   ? Person(s) Educated Patient   ? Methods Explanation;Demonstration;Tactile cues;Verbal cues   ? Comprehension Verbalized understanding;Returned demonstration;Verbal cues required;Tactile cues required   ? ?  ?  ? ?  ? ? ? PT Short Term Goals - 05/05/21 1253   ? ?  ? PT SHORT TERM GOAL #1  ? Title Patient will be independent in home exercise program to improve strength/mobility for better functional independence with ADLs.   ? Baseline 10/4 HEP provided 11/8: HEP compliant   ? Time 4   ? Period Weeks   ? Status Achieved   ? Target Date 03/08/21   ? ?  ?  ? ?  ? ? ? ? PT Long Term Goals - 07/26/21 0001   ? ?  ? PT LONG TERM GOAL #1  ? Title Patient will increase FOTO score to equal to or greater than  51/100   to demonstrate statistically significant improvement in mobility and quality of life.   ? Baseline 10/4: 42% 11/8: 36% 12/29: 45%; 06/09/2021= 36% 39: 36%   ? Time 12   ? Period Weeks   ?  Status On-going   ? Target Date 10/18/21   ?  ? PT LONG TERM GOAL #2  ? Title Patient (< 49 years old) will complete five times sit to stand test in < 20 seconds indicating an increased LE strength and improved balance.   ? Baseline 10/4: 33.15 seconds with heavy BUE support 11/8: 30 seconds; 12/29: deferred to future session. Pt attempted today but unable to complete d/t increase in R side LBP; 06/09/2021= 27.72 sec with UE support 3/9: 37.74 seconds 3/21: 29 seconds with BUE support   ? Time  12   ? Period Weeks   ? Status Partially Met   ? Target Date 10/18/21   ?  ? PT LONG TERM GOAL #3  ? Title Patient will increase 10 meter walk test to <20 seconds as to improve gait speed for better community ambulation and to reduce fall risk.   ? Baseline 10/4: 43.17 seconds with RW 11/8: 40.8 seconds; 43 sec (0.23 m/s) with RW; 06/09/2021= 40.7 sec with RW 3/9: 38.8 seconds   ? Time 12   ? Period Weeks   ? Status Partially Met   ? Target Date 10/18/21   ?  ? PT LONG TERM GOAL #4  ? Title Patient will reduce timed up and go to <11 seconds to reduce fall risk and demonstrate improved transfer/gait ability.   ? Baseline 10/4: 52.66 seconds with RW 11/8: 49 seconds with RW; 12/29: 44.3 sec; 06/09/2021= 46.45 sec with use of RW 3/9: 39.9   ? Time 12   ? Period Weeks   ? Status Partially Met   ? Target Date 10/18/21   ?  ? PT LONG TERM GOAL #5  ? Title Patient will transfer from her shower seat independently for safety with ADLs.   ? Baseline 3/21: unable   ? Time 12   ? Period Weeks   ? Status New   ? Target Date 10/18/21   ?  ? PT LONG TERM GOAL #6  ? Title Patient will get into bed without assistance to increase independence with functional mobility.   ? Baseline 3/21: needs assistance from sister   ? Time 12   ? Period Weeks   ? Status New   ? Target Date 10/18/21   ? ?  ?  ? ?  ? ? ? ? ? ? ? ? Plan - 07/26/21 1232   ? ? Clinical Impression Statement Patient has made significant progress towards functional goals at this time. New  goals addressing mobility in regards to bathing and bed transfers introduced to increase patient independence. Patient is highly motivated and will continue progressing with functional mobility and ambulati

## 2021-07-28 ENCOUNTER — Ambulatory Visit: Payer: PPO

## 2021-07-28 ENCOUNTER — Other Ambulatory Visit: Payer: Self-pay

## 2021-07-28 DIAGNOSIS — R2689 Other abnormalities of gait and mobility: Secondary | ICD-10-CM | POA: Diagnosis not present

## 2021-07-28 DIAGNOSIS — R2681 Unsteadiness on feet: Secondary | ICD-10-CM

## 2021-07-28 DIAGNOSIS — M6281 Muscle weakness (generalized): Secondary | ICD-10-CM

## 2021-07-28 NOTE — Therapy (Signed)
[facility] ?[place], [place], [address] ?Phone: [phone]   Fax:  [phone] ? ?Physical Therapy Treatment ? ?Patient Details  ?Name: Lisa Williams ?MRN: [id number] ?Date of Birth: 11/01/1960 ?Referring Provider (PT): Margette Fast MD ? ? ?Encounter Date: 07/28/2021 ? ? PT End of Session - 07/28/21 1003   ? ? Visit Number 44   ? Number of Visits 67   ? Date for PT Re-Evaluation 10/18/21   ? Authorization Type 4/10 PN 3/9   ? PT Start Time 1015   ? PT Stop Time 1059   ? PT Time Calculation (min) 44 min   ? Equipment Utilized During Treatment Gait belt   ? Activity Tolerance Patient tolerated treatment well;Patient limited by fatigue   ? Behavior During Therapy [facility] for tasks assessed/performed   ? ?  ?  ? ?  ? ? ?Past Medical History:  ?Diagnosis Date  ? Abdominal pain, right upper quadrant   ? Back pain   ? Calculus of kidney 12/09/2013  ? Chronic back pain   ? unspecified  ? Chronic left shoulder pain 07/19/2015  ? Complication of anesthesia   ? Functional disorder of bladder   ? other  ? Galactorrhea 11/26/2014  ? Chronic   ? Hereditary and idiopathic neuropathy 08/19/2013  ? HPV test positive   ? Hypercholesteremia 08/19/2013  ? Hypertension   ? Incomplete bladder emptying   ? Microscopic hematuria   ? MS (multiple sclerosis) ([place])   ? Muscle spasticity 05/21/2014  ? Nonspecific findings on examination of urine   ? other  ? Osteopenia   ? PONV (postoperative nausea and vomiting)   ? Status post laparoscopic supracervical hysterectomy 11/26/2014  ? Tobacco user 11/26/2014  ? Wrist fracture   ? ? ?Past Surgical History:  ?Procedure Laterality Date  ? bilateral tubal ligation  1996  ? BREAST CYST EXCISION Left 2002  ? FRACTURE SURGERY    ? KNEE SURGERY    ? right  ? LAPAROSCOPIC SUPRACERVICAL HYSTERECTOMY  08/05/2013  ? ORIF WRIST FRACTURE Left 01/17/2017  ? Procedure: OPEN REDUCTION INTERNAL FIXATION (ORIF) WRIST FRACTURE;  Surgeon: Lovell Sheehan, MD;  Location: [facility];  Service: Orthopedics;  Laterality: Left;  ? RADIOLOGY WITH ANESTHESIA N/A 03/18/2020  ? Procedure: MRI WITH ANESTHESIA CERVICAL SPINE AND BRAIN  WITH AND WITHOUT CONTRAST;  Surgeon: Radiologist, Medication, MD;  Location: [place];  Service: Radiology;  Laterality: N/A;  ? TUBAL LIGATION Bilateral   ? VAGINAL HYSTERECTOMY  03/2006  ? ? ?There were no vitals filed for this visit. ? ? Subjective Assessment - 07/28/21 1156   ? ? Subjective Patient reports she has had some muscle spasms. Is still having difficulty getting into bed.   ? Pertinent History Patient is a pleasant 61 year old female who is returning to PT after MS exacerbation and hospitalization for COVID. Patient went to SNF for one month and then received home health PT for a month (8 visits).  PMH includes HTN, SVT, neurogenic bowel, MS (1995), neuropathy, hx of wrist fx, HPV, Hypercholesteremia, adiposity, osteopenia, spasticity. She wears bilateral AFOs and uses a RW and/or wheelchair for mobility. She drives an adapted car.   ? Limitations Lifting;Standing;Walking;House hold activities;Sitting   ? How long can you sit comfortably? n/a   ? How long can you stand comfortably? 2 minutes   ? How long can you walk comfortably? 40 ft in house   ? Currently in  Pain? No/denies   ? ?  ?  ? ?  ? ? ? ? ? ?TherAct:  ?Stand pivot from wheelchair to plinth table with CGA ?Seated to supine position: mod A for bringing LE onto table:  ?Supine to seated position: mod A for trunk and LE's  ?Stand pivot from raised plinth table to chair with CGA ? ?TherEx:  ? ?Supine: ?Abduction/adduction leg wipes AAROM 10x each LE (added to HEP) ?Knees on bolster: SAQ 10x each LE; x2 sets each LE ?Modified bridge with significant stabilization to LE's 10x ?Overhead raise with GTB 10x ? ?Seated: ?GTB TrA activation 10x 3 second holds ?Cross body crunch with elbow drive 09T each side ? ?Manual: ?Hamstring lengthening with leg on PT shoulder 60 seconds each  LE ?Single knee to chest 60 seconds each LE ?Cross body knee to chest 60 seconds each LE ?Piriformis lengthening stretch 60 seconds each Le ?LE rotation x2 minutes for low back pain relief ? ?Ice pack donned throughout session to minimize exacerbation of MS symptoms in order to perform interventions ?  ?Patient's bed mobility assessed with patient requiring assistance for LE's to reach bed as she is unable to lift legs herself. Abduction and adduction strengthening will assist with this movement and patient educated on leg slides to add to HEP while in bed. Patient is highly motivated throughout session and reports fatigue by end of session. The pt will benefit from further skilled PT to improve mobility, strength, pain, gait and balance in order to decrease fall risk and increase QOL ? ? ? ? ? ? ? ? ? ? ? ? ? ? ? ? ? ? ? ? ? PT Education - 07/28/21 1003   ? ? Education provided Yes   ? Education Details exercise technique, body mechanics   ? Person(s) Educated Patient   ? Methods Explanation;Demonstration;Tactile cues;Verbal cues   ? Comprehension Verbalized understanding;Returned demonstration;Verbal cues required;Tactile cues required   ? ?  ?  ? ?  ? ? ? PT Short Term Goals - 05/05/21 1253   ? ?  ? PT SHORT TERM GOAL #1  ? Title Patient will be independent in home exercise program to improve strength/mobility for better functional independence with ADLs.   ? Baseline 10/4 HEP provided 11/8: HEP compliant   ? Time 4   ? Period Weeks   ? Status Achieved   ? Target Date 03/08/21   ? ?  ?  ? ?  ? ? ? ? PT Long Term Goals - 07/26/21 0001   ? ?  ? PT LONG TERM GOAL #1  ? Title Patient will increase FOTO score to equal to or greater than  51/100   to demonstrate statistically significant improvement in mobility and quality of life.   ? Baseline 10/4: 42% 11/8: 36% 12/29: 45%; 06/09/2021= 36% 39: 36%   ? Time 12   ? Period Weeks   ? Status On-going   ? Target Date 10/18/21   ?  ? PT LONG TERM GOAL #2  ? Title Patient (<  46 years old) will complete five times sit to stand test in < 20 seconds indicating an increased LE strength and improved balance.   ? Baseline 10/4: 33.15 seconds with heavy BUE support 11/8: 30 seconds; 12/29: deferred to future session. Pt attempted today but unable to complete d/t increase in R side LBP; 06/09/2021= 27.72 sec with UE support 3/9: 37.74 seconds 3/21: 29 seconds with BUE support   ? Time 12   ?  Period Weeks   ? Status Partially Met   ? Target Date 10/18/21   ?  ? PT LONG TERM GOAL #3  ? Title Patient will increase 10 meter walk test to <20 seconds as to improve gait speed for better community ambulation and to reduce fall risk.   ? Baseline 10/4: 43.17 seconds with RW 11/8: 40.8 seconds; 43 sec (0.23 m/s) with RW; 06/09/2021= 40.7 sec with RW 3/9: 38.8 seconds   ? Time 12   ? Period Weeks   ? Status Partially Met   ? Target Date 10/18/21   ?  ? PT LONG TERM GOAL #4  ? Title Patient will reduce timed up and go to <11 seconds to reduce fall risk and demonstrate improved transfer/gait ability.   ? Baseline 10/4: 52.66 seconds with RW 11/8: 49 seconds with RW; 12/29: 44.3 sec; 06/09/2021= 46.45 sec with use of RW 3/9: 39.9   ? Time 12   ? Period Weeks   ? Status Partially Met   ? Target Date 10/18/21   ?  ? PT LONG TERM GOAL #5  ? Title Patient will transfer from her shower seat independently for safety with ADLs.   ? Baseline 3/21: unable   ? Time 12   ? Period Weeks   ? Status New   ? Target Date 10/18/21   ?  ? PT LONG TERM GOAL #6  ? Title Patient will get into bed without assistance to increase independence with functional mobility.   ? Baseline 3/21: needs assistance from sister   ? Time 12   ? Period Weeks   ? Status New   ? Target Date 10/18/21   ? ?  ?  ? ?  ? ? ? ? ? ? ? ? Plan - 07/28/21 1157   ? ? Clinical Impression Statement Patient's bed mobility assessed with patient requiring assistance for LE's to reach bed as she is unable to lift legs herself. Abduction and adduction strengthening will  assist with this movement and patient educated on leg slides to add to HEP while in bed. Patient is highly motivated throughout session and reports fatigue by end of session. The pt will benefit from fu

## 2021-08-01 ENCOUNTER — Ambulatory Visit (INDEPENDENT_AMBULATORY_CARE_PROVIDER_SITE_OTHER): Payer: PPO | Admitting: Neurology

## 2021-08-01 ENCOUNTER — Encounter: Payer: Self-pay | Admitting: Neurology

## 2021-08-01 VITALS — BP 137/65 | HR 61 | Ht 71.0 in | Wt 260.0 lb

## 2021-08-01 DIAGNOSIS — R29898 Other symptoms and signs involving the musculoskeletal system: Secondary | ICD-10-CM

## 2021-08-01 DIAGNOSIS — G35 Multiple sclerosis: Secondary | ICD-10-CM

## 2021-08-01 DIAGNOSIS — Z79899 Other long term (current) drug therapy: Secondary | ICD-10-CM

## 2021-08-01 DIAGNOSIS — R269 Unspecified abnormalities of gait and mobility: Secondary | ICD-10-CM

## 2021-08-01 DIAGNOSIS — E559 Vitamin D deficiency, unspecified: Secondary | ICD-10-CM

## 2021-08-01 MED ORDER — BACLOFEN 20 MG PO TABS: 20.0000 mg | ORAL_TABLET | Freq: Three times a day (TID) | ORAL | 3 refills | Status: DC

## 2021-08-01 MED ORDER — TIZANIDINE HCL 4 MG PO TABS: ORAL_TABLET | ORAL | 5 refills | Status: DC

## 2021-08-01 MED ORDER — TIZANIDINE HCL 2 MG PO TABS: 2.0000 mg | ORAL_TABLET | Freq: Four times a day (QID) | ORAL | 3 refills | Status: DC | PRN

## 2021-08-01 MED ORDER — GABAPENTIN 100 MG PO CAPS: 100.0000 mg | ORAL_CAPSULE | Freq: Two times a day (BID) | ORAL | 3 refills | Status: DC

## 2021-08-01 NOTE — Progress Notes (Addendum)
? ?[facility] ? ?PATIENT: Lisa Williams ?DOB: [birth year] ? ?REFERRING DOCTOR OR PCP: Einar Crow, MD (PCP) ?SOURCE: Patient, notes from Dr. Anne Hahn, imaging and lab reports, MRI images personally reviewed and summarized below. ? ?_________________________________ ? ? ?HISTORICAL ? ?CHIEF COMPLAINT:  ?Chief Complaint  ?Patient presents with  ? Follow-up  ?  Rm 2, alone. Here for 6 month MS f/u, on Mayzent and tolerating well. TOC from Dr. Anne Hahn. Pt is having increase in muscle spasms. R side of body, taking tizanidine. No falls. Doing well in PT. Walking and sit to stand has improved. Pt uses a WC today but mainly uses a stand walker to  Needing refills today.   ? ? ?HISTORY OF PRESENT ILLNESS:  ?I had the pleasure of seeing your patient, Lisa Williams, at the [facility] at [facility] for neurologic consultation regarding her multiple sclerosis. ? ?She is a 61 year old woman who was diagnosed in 1995 after presenting with right leg weakness and foot drop.   One day, she lost all sensation in her legs.   She saw Dr. Chestine Spore in [place] until his retirement.   She then saw Dr. [name] in [place] and she started to see Dr. Anne Hahn 2 years ago.     She was initially placed on Copaxone x 3 years and then was off all DMTs for many years and then started Rituxan around 2018 to 2019.  At that time, associated with a UTI, she felt much more weak prompting her to go on a medication.   She started Mayzent since 2020.   She has tolerated it well.     ? ?Currently she is weakness in both legs, R worse than L.   Her arms are strong.   She drives with hand controls x 10 years.   She is able to walk > 100 feet with a walker.  She denies sensory symptoms now but had some   She has urinary urgency helped by oxybutynin.   No recent UTI.  Vision is doing well.  She has never had neuritis.     ? ?She has muscle spasms on the right, mostly in the leg.   Tizanidine has helped  some. She takes 4 mg po tid. ? ?She has only mild fatigue, sometimes often with heat.  She sleeps well most nights.   Mood and cognition are fine.     ? ? ?IMAGING: ?MRI of the brain 03/18/2020 showed multiple T2/FLAIR hyperintense foci in the periventricular, juxtacortical and deep white matter.  There were no infratentorial lesions noted.  None of the foci enhanced.   Due to severe claustrophobia, the study was done with conscious sedation in the hospital.   ? ?MRI of the cervical spine 03/18/2020 showed T2 hyperintense foci at C1-C2, C2-C3, C4-C5, C5-C6 and T1.  There is a disc protrusion at C5-C6 to the left and a larger disc protrusion at C6-C7 to the right that distorts the thecal sac.  The adjacent spinal cord has normal signal.  There could be compression of the right C7 nerve root.  ? ? ? ?REVIEW OF SYSTEMS: ?Constitutional: No fevers, chills, sweats, or change in appetite ?Eyes: No visual changes, double vision, eye pain ?Ear, nose and throat: No hearing loss, ear pain, nasal congestion, sore throat ?Cardiovascular: No chest pain, palpitations ?Respiratory:  No shortness of breath at rest or with exertion.   No wheezes ?GastrointestinaI: No nausea, vomiting, diarrhea, abdominal pain, fecal incontinence ?Genitourinary:  No dysuria, urinary retention or  frequency.  No nocturia. ?Musculoskeletal:  No neck pain, back pain ?Integumentary: No rash, pruritus, skin lesions ?Neurological: as above ?Psychiatric: No depression at this time.  No anxiety ?Endocrine: No palpitations, diaphoresis, change in appetite, change in weigh or increased thirst ?Hematologic/Lymphatic:  No anemia, purpura, petechiae. ?Allergic/Immunologic: No itchy/runny eyes, nasal congestion, recent allergic reactions, rashes ? ?ALLERGIES: ?Allergies  ?Allergen Reactions  ? Amlodipine Swelling  ? Povidone Iodine Rash  ? ? ?HOME MEDICATIONS: ? ?Current Outpatient Medications:  ?  acetaminophen (TYLENOL) 500 MG tablet, Take 500-1,000 mg by mouth  every 6 (six) hours as needed for moderate pain or headache., Disp: , Rfl:  ?  baclofen (LIORESAL) 20 MG tablet, Take 1 tablet (20 mg total) by mouth 3 (three) times daily., Disp: 270 each, Rfl: 0 ?  Cholecalciferol 25 MCG (1000 UT) tablet, Take 2,000 Units by mouth daily., Disp: , Rfl:  ?  gabapentin (NEURONTIN) 100 MG capsule, Take 1 capsule (100 mg total) by mouth 2 (two) times daily., Disp: 180 capsule, Rfl: 0 ?  oxybutynin (DITROPAN-XL) 10 MG 24 hr tablet, Take 1 tablet (10 mg total) by mouth daily., Disp: 90 tablet, Rfl: 3 ?  Siponimod Fumarate (MAYZENT) 2 MG TABS, Take 2 mg by mouth daily., Disp: 30 tablet, Rfl: 5 ?  telmisartan (MICARDIS) 40 MG tablet, Take 40 mg by mouth daily., Disp: , Rfl:  ?  tiZANidine (ZANAFLEX) 2 MG tablet, Take 1 tablet (2 mg total) by mouth every 6 (six) hours as needed for muscle spasms., Disp: 120 tablet, Rfl: 3 ? ?PAST MEDICAL HISTORY: ?Past Medical History:  ?Diagnosis Date  ? Abdominal pain, right upper quadrant   ? Back pain   ? Calculus of kidney 12/09/2013  ? Chronic back pain   ? unspecified  ? Chronic left shoulder pain 07/19/2015  ? Complication of anesthesia   ? Functional disorder of bladder   ? other  ? Galactorrhea 11/26/2014  ? Chronic   ? Hereditary and idiopathic neuropathy 08/19/2013  ? HPV test positive   ? Hypercholesteremia 08/19/2013  ? Hypertension   ? Incomplete bladder emptying   ? Microscopic hematuria   ? MS (multiple sclerosis) (HCC)   ? Muscle spasticity 05/21/2014  ? Nonspecific findings on examination of urine   ? other  ? Osteopenia   ? PONV (postoperative nausea and vomiting)   ? Status post laparoscopic supracervical hysterectomy 11/26/2014  ? Tobacco user 11/26/2014  ? Wrist fracture   ? ? ?PAST SURGICAL HISTORY: ?Past Surgical History:  ?Procedure Laterality Date  ? bilateral tubal ligation  1996  ? BREAST CYST EXCISION Left 2002  ? FRACTURE SURGERY    ? KNEE SURGERY    ? right  ? LAPAROSCOPIC SUPRACERVICAL HYSTERECTOMY  08/05/2013  ? ORIF WRIST FRACTURE  Left 01/17/2017  ? Procedure: OPEN REDUCTION INTERNAL FIXATION (ORIF) WRIST FRACTURE;  Surgeon: Lyndle Herrlich, MD;  Location: [facility];  Service: Orthopedics;  Laterality: Left;  ? RADIOLOGY WITH ANESTHESIA N/A 03/18/2020  ? Procedure: MRI WITH ANESTHESIA CERVICAL SPINE AND BRAIN  WITH AND WITHOUT CONTRAST;  Surgeon: Radiologist, Medication, MD;  Location: [facility];  Service: Radiology;  Laterality: N/A;  ? TUBAL LIGATION Bilateral   ? VAGINAL HYSTERECTOMY  03/2006  ? ? ?FAMILY HISTORY: ?Family History  ?Problem Relation Age of Onset  ? Sickle cell trait Sister   ? Hypertension Sister   ? Supraventricular tachycardia Sister   ? Hypertension Sister   ? Diabetes Maternal Grandmother   ? Liver cancer Maternal Grandmother   ?  Diabetes Maternal Aunt   ? Breast cancer Maternal Aunt   ?     great MAT  ? Ovarian cancer Paternal Grandmother   ? Nephrolithiasis Father   ? Hypertension Father   ? Prostate cancer Father   ? Kidney disease Father   ? Heart failure Father   ? Hypertension Sister   ? Osteoarthritis Mother   ? Hypertension Mother   ? Hyperlipidemia Mother   ? GU problems Neg Hx   ? Urolithiasis Neg Hx   ? ? ?SOCIAL HISTORY: ? ?Social History  ? ?Socioeconomic History  ? Marital status: Single  ?  Spouse name: Not on file  ? Number of children: 0  ? Years of education: 8  ? Highest education level: Not on file  ?Occupational History  ? Not on file  ?Tobacco Use  ? Smoking status: Former  ?  Packs/day: 0.50  ?  Types: Cigarettes  ? Smokeless tobacco: Never  ?Vaping Use  ? Vaping Use: Never used  ?Substance and Sexual Activity  ? Alcohol use: No  ?  Alcohol/week: 0.0 standard drinks  ? Drug use: No  ? Sexual activity: Not Currently  ?  Birth control/protection: Surgical  ?Other Topics Concern  ? Not on file  ?Social History Narrative  ? Lives at home with mom and sister,. Has a walker for ambulation/uses WC when necessary  ? Right Handed  ? Drinks no caffeine  ? ?Social Determinants of Health  ? ?Financial Resource  Strain: Not on file  ?Food Insecurity: Not on file  ?Transportation Needs: Not on file  ?Physical Activity: Not on file  ?Stress: Not on file  ?Social Connections: Not on file  ?Intimate Partner Violence: No

## 2021-08-02 ENCOUNTER — Other Ambulatory Visit: Payer: Self-pay

## 2021-08-02 ENCOUNTER — Ambulatory Visit: Payer: PPO

## 2021-08-02 DIAGNOSIS — M6281 Muscle weakness (generalized): Secondary | ICD-10-CM

## 2021-08-02 DIAGNOSIS — R2689 Other abnormalities of gait and mobility: Secondary | ICD-10-CM

## 2021-08-02 DIAGNOSIS — R2681 Unsteadiness on feet: Secondary | ICD-10-CM

## 2021-08-02 LAB — CBC WITH DIFFERENTIAL/PLATELET
Basophils Absolute: 0 10*3/uL (ref 0.0–0.2)
Basos: 1 %
EOS (ABSOLUTE): 0.1 10*3/uL (ref 0.0–0.4)
Eos: 2 %
Hematocrit: 40.1 % (ref 34.0–46.6)
Hemoglobin: 13.5 g/dL (ref 11.1–15.9)
Immature Grans (Abs): 0 10*3/uL (ref 0.0–0.1)
Immature Granulocytes: 0 %
Lymphocytes Absolute: 0.4 10*3/uL — ABNORMAL LOW (ref 0.7–3.1)
Lymphs: 7 %
MCH: 26.9 pg (ref 26.6–33.0)
MCHC: 33.7 g/dL (ref 31.5–35.7)
MCV: 80 fL (ref 79–97)
Monocytes Absolute: 0.6 10*3/uL (ref 0.1–0.9)
Monocytes: 11 %
Neutrophils Absolute: 4.2 10*3/uL (ref 1.4–7.0)
Neutrophils: 79 %
Platelets: 226 10*3/uL (ref 150–450)
RBC: 5.02 x10E6/uL (ref 3.77–5.28)
RDW: 14.5 % (ref 11.7–15.4)
WBC: 5.4 10*3/uL (ref 3.4–10.8)

## 2021-08-02 LAB — VITAMIN D 25 HYDROXY (VIT D DEFICIENCY, FRACTURES): Vit D, 25-Hydroxy: 33.3 ng/mL (ref 30.0–100.0)

## 2021-08-02 NOTE — Therapy (Signed)
[facility] ?[place], [place], [address] ?Phone: [phone]   Fax:  [phone] ? ?Physical Therapy Treatment ? ?Patient Details  ?Name: Lisa Williams ?MRN: [id number] ?Date of Birth: 1960/11/12 ?Referring Provider (PT): Margette Fast MD ? ? ?Encounter Date: 08/02/2021 ? ? PT End of Session - 08/02/21 1119   ? ? Visit Number [id number]   ? Number of Visits 67   ? Date for PT Re-Evaluation 10/18/21   ? Authorization Type 5/10 PN 3/9   ? PT Start Time 1015   ? PT Stop Time 1100   ? PT Time Calculation (min) 45 min   ? Equipment Utilized During Treatment Gait belt   ? Activity Tolerance Patient tolerated treatment well;Patient limited by fatigue   ? Behavior During Therapy [facility] for tasks assessed/performed   ? ?  ?  ? ?  ? ? ?Past Medical History:  ?Diagnosis Date  ? Abdominal pain, right upper quadrant   ? Back pain   ? Calculus of kidney 12/09/2013  ? Chronic back pain   ? unspecified  ? Chronic left shoulder pain 07/19/2015  ? Complication of anesthesia   ? Functional disorder of bladder   ? other  ? Galactorrhea 11/26/2014  ? Chronic   ? Hereditary and idiopathic neuropathy 08/19/2013  ? HPV test positive   ? Hypercholesteremia 08/19/2013  ? Hypertension   ? Incomplete bladder emptying   ? Microscopic hematuria   ? MS (multiple sclerosis) ([place])   ? Muscle spasticity 05/21/2014  ? Nonspecific findings on examination of urine   ? other  ? Osteopenia   ? PONV (postoperative nausea and vomiting)   ? Status post laparoscopic supracervical hysterectomy 11/26/2014  ? Tobacco user 11/26/2014  ? Wrist fracture   ? ? ?Past Surgical History:  ?Procedure Laterality Date  ? bilateral tubal ligation  1996  ? BREAST CYST EXCISION Left 2002  ? FRACTURE SURGERY    ? KNEE SURGERY    ? right  ? LAPAROSCOPIC SUPRACERVICAL HYSTERECTOMY  08/05/2013  ? ORIF WRIST FRACTURE Left 01/17/2017  ? Procedure: OPEN REDUCTION INTERNAL FIXATION (ORIF) WRIST FRACTURE;  Surgeon: Lovell Sheehan, MD;  Location: [facility];  Service: Orthopedics;  Laterality: Left;  ? RADIOLOGY WITH ANESTHESIA N/A 03/18/2020  ? Procedure: MRI WITH ANESTHESIA CERVICAL SPINE AND BRAIN  WITH AND WITHOUT CONTRAST;  Surgeon: Radiologist, Medication, MD;  Location: [facility];  Service: Radiology;  Laterality: N/A;  ? TUBAL LIGATION Bilateral   ? VAGINAL HYSTERECTOMY  03/2006  ? ? ?There were no vitals filed for this visit. ? ? Subjective Assessment - 08/02/21 1023   ? ? Subjective Patient reports her medication has been upped for her spasms. Went to the doctor yesterday.   ? Pertinent History Patient is a pleasant 61 year old female who is returning to PT after MS exacerbation and hospitalization for COVID. Patient went to SNF for one month and then received home health PT for a month (8 visits).  PMH includes HTN, SVT, neurogenic bowel, MS (1995), neuropathy, hx of wrist fx, HPV, Hypercholesteremia, adiposity, osteopenia, spasticity. She wears bilateral AFOs and uses a RW and/or wheelchair for mobility. She drives an adapted car.   ? Limitations Lifting;Standing;Walking;House hold activities;Sitting   ? How long can you sit comfortably? n/a   ? How long can you stand comfortably? 2 minutes   ? How long can you walk comfortably? 40 ft in house   ? Currently in  Pain? No/denies   ? ?  ?  ? ?  ? ? ? ? ? ? ? ? ? ? ?standing in // bars: ?-standing weight shifts ?-modified tandem stance 15 seconds no UE support ?-pool noodle chest press 10x ?-pool noodle overhead raise 10x ?-sit to stand 5x with rest breaks.  ? ? ?  ?Seated: ?  ?Seated on dynadisc:  ? -static sit arms crossed 30 seconds ?-anterior posterior trunk lean 10x each direction ?-medial lateral trunk lean outside BOS 10x each LE ?-alternating UE raises 10x each UE ?-coordinated clapping with PT x 3 minutes ?  ?RTB adduction 10x each LE ?RTB abduction 10xeach LE ?RTB row 10x ?  ?  ?  ?Ice pack donned throughout session to minimize exacerbation of MS symptoms in order to  perform interventions ?  ?  ?Pt educated throughout session about proper posture and technique with exercises. Improved exercise technique, movement at target joints, use of target muscles after min to mod verbal, visual, tactile cues. ? ? ? ? ? ? ? ?Patient has significant improvements of core stabilization with decreased episodes of instability while sitting on dynadisc. Patient remains highly motivated throughout session and requires intermittent rest breaks due to fatigue. The pt will benefit from further skilled PT to improve mobility, strength, pain, gait and balance in order to decrease fall risk and increase QOL ? ? ? ? ? ? ? ? PT Education - 08/02/21 1119   ? ? Education provided Yes   ? Education Details exercise technique, body mechanics   ? Person(s) Educated Patient   ? Methods Explanation;Demonstration;Tactile cues;Verbal cues   ? Comprehension Verbalized understanding;Returned demonstration;Verbal cues required;Tactile cues required   ? ?  ?  ? ?  ? ? ? PT Short Term Goals - 05/05/21 1253   ? ?  ? PT SHORT TERM GOAL #1  ? Title Patient will be independent in home exercise program to improve strength/mobility for better functional independence with ADLs.   ? Baseline 10/4 HEP provided 11/8: HEP compliant   ? Time 4   ? Period Weeks   ? Status Achieved   ? Target Date 03/08/21   ? ?  ?  ? ?  ? ? ? ? PT Long Term Goals - 07/26/21 0001   ? ?  ? PT LONG TERM GOAL #1  ? Title Patient will increase FOTO score to equal to or greater than  51/100   to demonstrate statistically significant improvement in mobility and quality of life.   ? Baseline 10/4: 42% 11/8: 36% 12/29: 45%; 06/09/2021= 36% 39: 36%   ? Time 12   ? Period Weeks   ? Status On-going   ? Target Date 10/18/21   ?  ? PT LONG TERM GOAL #2  ? Title Patient (< 5 years old) will complete five times sit to stand test in < 20 seconds indicating an increased LE strength and improved balance.   ? Baseline 10/4: 33.15 seconds with heavy BUE support 11/8: 30  seconds; 12/29: deferred to future session. Pt attempted today but unable to complete d/t increase in R side LBP; 06/09/2021= 27.72 sec with UE support 3/9: 37.74 seconds 3/21: 29 seconds with BUE support   ? Time 12   ? Period Weeks   ? Status Partially Met   ? Target Date 10/18/21   ?  ? PT LONG TERM GOAL #3  ? Title Patient will increase 10 meter walk test to <20 seconds as to improve  gait speed for better community ambulation and to reduce fall risk.   ? Baseline 10/4: 43.17 seconds with RW 11/8: 40.8 seconds; 43 sec (0.23 m/s) with RW; 06/09/2021= 40.7 sec with RW 3/9: 38.8 seconds   ? Time 12   ? Period Weeks   ? Status Partially Met   ? Target Date 10/18/21   ?  ? PT LONG TERM GOAL #4  ? Title Patient will reduce timed up and go to <11 seconds to reduce fall risk and demonstrate improved transfer/gait ability.   ? Baseline 10/4: 52.66 seconds with RW 11/8: 49 seconds with RW; 12/29: 44.3 sec; 06/09/2021= 46.45 sec with use of RW 3/9: 39.9   ? Time 12   ? Period Weeks   ? Status Partially Met   ? Target Date 10/18/21   ?  ? PT LONG TERM GOAL #5  ? Title Patient will transfer from her shower seat independently for safety with ADLs.   ? Baseline 3/21: unable   ? Time 12   ? Period Weeks   ? Status New   ? Target Date 10/18/21   ?  ? PT LONG TERM GOAL #6  ? Title Patient will get into bed without assistance to increase independence with functional mobility.   ? Baseline 3/21: needs assistance from sister   ? Time 12   ? Period Weeks   ? Status New   ? Target Date 10/18/21   ? ?  ?  ? ?  ? ? ? ? ? ? ? ? Plan - 08/02/21 1121   ? ? Clinical Impression Statement Patient has significant improvements of core stabilization with decreased episodes of instability while sitting on dynadisc. Patient remains highly motivated throughout session and requires intermittent rest breaks due to fatigue. The pt will benefit from further skilled PT to improve mobility, strength, pain, gait and balance in order to decrease fall risk and  increase QOL   ? Personal Factors and Comorbidities Age;Comorbidity 3+;Finances;Fitness;Past/Current Experience;Sex;Social Background;Time since onset of injury/illness/exacerbation;Transportation   ? Comorbid

## 2021-08-04 ENCOUNTER — Ambulatory Visit: Payer: PPO

## 2021-08-04 DIAGNOSIS — M6281 Muscle weakness (generalized): Secondary | ICD-10-CM

## 2021-08-04 DIAGNOSIS — R2681 Unsteadiness on feet: Secondary | ICD-10-CM

## 2021-08-04 DIAGNOSIS — R2689 Other abnormalities of gait and mobility: Secondary | ICD-10-CM

## 2021-08-04 NOTE — Therapy (Signed)
[place] ?[place], [place], [address] ?Phone: [phone]   Fax:  [phone] ? ?Physical Therapy Treatment ? ?Patient Details  ?Name: Lisa Williams ?MRN: [id number] ?Date of Birth: 05-26-1960 ?Referring Provider (PT): Margette Fast MD ? ? ?Encounter Date: 08/04/2021 ? ? PT End of Session - 08/04/21 1003   ? ? Visit Number 27   ? Number of Visits 67   ? Date for PT Re-Evaluation 10/18/21   ? Authorization Type 6/10 PN 3/9   ? PT Start Time 1015   ? PT Stop Time 1059   ? PT Time Calculation (min) 44 min   ? Equipment Utilized During Treatment Gait belt   ? Activity Tolerance Patient tolerated treatment well;Patient limited by fatigue   ? Behavior During Therapy [facility] for tasks assessed/performed   ? ?  ?  ? ?  ? ? ?Past Medical History:  ?Diagnosis Date  ? Abdominal pain, right upper quadrant   ? Back pain   ? Calculus of kidney 12/09/2013  ? Chronic back pain   ? unspecified  ? Chronic left shoulder pain 07/19/2015  ? Complication of anesthesia   ? Functional disorder of bladder   ? other  ? Galactorrhea 11/26/2014  ? Chronic   ? Hereditary and idiopathic neuropathy 08/19/2013  ? HPV test positive   ? Hypercholesteremia 08/19/2013  ? Hypertension   ? Incomplete bladder emptying   ? Microscopic hematuria   ? MS (multiple sclerosis) ([place])   ? Muscle spasticity 05/21/2014  ? Nonspecific findings on examination of urine   ? other  ? Osteopenia   ? PONV (postoperative nausea and vomiting)   ? Status post laparoscopic supracervical hysterectomy 11/26/2014  ? Tobacco user 11/26/2014  ? Wrist fracture   ? ? ?Past Surgical History:  ?Procedure Laterality Date  ? bilateral tubal ligation  1996  ? BREAST CYST EXCISION Left 2002  ? FRACTURE SURGERY    ? KNEE SURGERY    ? right  ? LAPAROSCOPIC SUPRACERVICAL HYSTERECTOMY  08/05/2013  ? ORIF WRIST FRACTURE Left 01/17/2017  ? Procedure: OPEN REDUCTION INTERNAL FIXATION (ORIF) WRIST FRACTURE;  Surgeon: Lovell Sheehan, MD;  Location: [facility];  Service: Orthopedics;  Laterality: Left;  ? RADIOLOGY WITH ANESTHESIA N/A 03/18/2020  ? Procedure: MRI WITH ANESTHESIA CERVICAL SPINE AND BRAIN  WITH AND WITHOUT CONTRAST;  Surgeon: Radiologist, Medication, MD;  Location: [place] [facility];  Service: Radiology;  Laterality: N/A;  ? TUBAL LIGATION Bilateral   ? VAGINAL HYSTERECTOMY  03/2006  ? ? ?There were no vitals filed for this visit. ? ? Subjective Assessment - 08/04/21 1245   ? ? Subjective Patient report she is feeling good. No falls or LOB since last session.   ? Pertinent History Patient is a pleasant 61 year old female who is returning to PT after MS exacerbation and hospitalization for COVID. Patient went to SNF for one month and then received home health PT for a month (8 visits).  PMH includes HTN, SVT, neurogenic bowel, MS (1995), neuropathy, hx of wrist fx, HPV, Hypercholesteremia, adiposity, osteopenia, spasticity. She wears bilateral AFOs and uses a RW and/or wheelchair for mobility. She drives an adapted car.   ? Limitations Lifting;Standing;Walking;House hold activities;Sitting   ? How long can you sit comfortably? n/a   ? How long can you stand comfortably? 2 minutes   ? How long can you walk comfortably? 40 ft in house   ? Currently in Pain? No/denies   ? ?  ?  ? ?  ? ? ? ? ? ? ?  Standing/walking with RW ?-walking with bRW, CGA and wheelchair follow, use of ice. Total of 140 ft, 1 rest break. Patient has decreased foot clearance bilaterally, R>L as she fatigues. Heavy BUE support required as patient fatigued. She is able to negotiate changing floor surfaces without LOB ? ? ?bathroom mobility with CGA and set up with wheelchair and bRW.  ? ?  ?Seated: ?  ?Weighted ball (3000 gr) ?-woodchop 10x each direction  ?-rainbow arc for core activation 10x ?-bicep curl 10x with overhead press  ? ?GTB row 12x ?GTB abduction 12x each LE ?GTB adduction 12x each LE  ? ?  ?Ice pack donned throughout session to minimize exacerbation of MS  symptoms in order to perform interventions ?  ?  ?Pt educated throughout session about proper posture and technique with exercises. Improved exercise technique, movement at target joints, use of target muscles after min to mod verbal, visual, tactile cues ?  ?  ?Patient presents to physical therapy with excellent motivation. She is able to ambulate longer duration with turns and changing surfaces. Her core and UE strength is improving with increased repetition. The pt will benefit from further skilled PT to improve mobility, strength, pain, gait and balance in order to decrease fall risk and increase QOL ? ? ? ? ? ? ? ? ? ? ? ? ? ? ? ? PT Education - 08/04/21 1002   ? ? Education provided Yes   ? Education Details exercise technique, body mechanics   ? Person(s) Educated Patient   ? Methods Explanation;Demonstration;Tactile cues;Verbal cues   ? Comprehension Verbalized understanding;Returned demonstration;Verbal cues required;Tactile cues required   ? ?  ?  ? ?  ? ? ? PT Short Term Goals - 05/05/21 1253   ? ?  ? PT SHORT TERM GOAL #1  ? Title Patient will be independent in home exercise program to improve strength/mobility for better functional independence with ADLs.   ? Baseline 10/4 HEP provided 11/8: HEP compliant   ? Time 4   ? Period Weeks   ? Status Achieved   ? Target Date 03/08/21   ? ?  ?  ? ?  ? ? ? ? PT Long Term Goals - 07/26/21 0001   ? ?  ? PT LONG TERM GOAL #1  ? Title Patient will increase FOTO score to equal to or greater than  51/100   to demonstrate statistically significant improvement in mobility and quality of life.   ? Baseline 10/4: 42% 11/8: 36% 12/29: 45%; 06/09/2021= 36% 39: 36%   ? Time 12   ? Period Weeks   ? Status On-going   ? Target Date 10/18/21   ?  ? PT LONG TERM GOAL #2  ? Title Patient (< 55 years old) will complete five times sit to stand test in < 20 seconds indicating an increased LE strength and improved balance.   ? Baseline 10/4: 33.15 seconds with heavy BUE support 11/8: 30  seconds; 12/29: deferred to future session. Pt attempted today but unable to complete d/t increase in R side LBP; 06/09/2021= 27.72 sec with UE support 3/9: 37.74 seconds 3/21: 29 seconds with BUE support   ? Time 12   ? Period Weeks   ? Status Partially Met   ? Target Date 10/18/21   ?  ? PT LONG TERM GOAL #3  ? Title Patient will increase 10 meter walk test to <20 seconds as to improve gait speed for better community ambulation and to reduce fall  risk.   ? Baseline 10/4: 43.17 seconds with RW 11/8: 40.8 seconds; 43 sec (0.23 m/s) with RW; 06/09/2021= 40.7 sec with RW 3/9: 38.8 seconds   ? Time 12   ? Period Weeks   ? Status Partially Met   ? Target Date 10/18/21   ?  ? PT LONG TERM GOAL #4  ? Title Patient will reduce timed up and go to <11 seconds to reduce fall risk and demonstrate improved transfer/gait ability.   ? Baseline 10/4: 52.66 seconds with RW 11/8: 49 seconds with RW; 12/29: 44.3 sec; 06/09/2021= 46.45 sec with use of RW 3/9: 39.9   ? Time 12   ? Period Weeks   ? Status Partially Met   ? Target Date 10/18/21   ?  ? PT LONG TERM GOAL #5  ? Title Patient will transfer from her shower seat independently for safety with ADLs.   ? Baseline 3/21: unable   ? Time 12   ? Period Weeks   ? Status New   ? Target Date 10/18/21   ?  ? PT LONG TERM GOAL #6  ? Title Patient will get into bed without assistance to increase independence with functional mobility.   ? Baseline 3/21: needs assistance from sister   ? Time 12   ? Period Weeks   ? Status New   ? Target Date 10/18/21   ? ?  ?  ? ?  ? ? ? ? ? ? ? ? Plan - 08/04/21 1253   ? ? Clinical Impression Statement Patient presents to physical therapy with excellent motivation. She is able to ambulate longer duration with turns and changing surfaces. Her core and UE strength is improving with increased repetition. The pt will benefit from further skilled PT to improve mobility, strength, pain, gait and balance in order to decrease fall risk and increase QOL   ? Personal  Factors and Comorbidities Age;Comorbidity 3+;Finances;Fitness;Past/Current Experience;Sex;Social Background;Time since onset of injury/illness/exacerbation;Transportation   ? Comorbidities HTN, SVT, neurogen

## 2021-08-09 ENCOUNTER — Ambulatory Visit: Payer: PPO | Attending: Neurology

## 2021-08-09 DIAGNOSIS — R2681 Unsteadiness on feet: Secondary | ICD-10-CM | POA: Diagnosis not present

## 2021-08-09 DIAGNOSIS — R2689 Other abnormalities of gait and mobility: Secondary | ICD-10-CM

## 2021-08-09 DIAGNOSIS — R262 Difficulty in walking, not elsewhere classified: Secondary | ICD-10-CM | POA: Insufficient documentation

## 2021-08-09 DIAGNOSIS — M6281 Muscle weakness (generalized): Secondary | ICD-10-CM

## 2021-08-09 NOTE — Therapy (Signed)
[place] [facility] ?[place], [place], [address] ?Phone: [phone]   Fax:  [phone] ? ?Physical Therapy Treatment ? ?Patient Details  ?Name: Lisa Williams ?MRN: [id number] ?Date of Birth: [birth year] ?Referring Provider (PT): Margette Fast MD ? ? ?Encounter Date: 08/09/2021 ? ? PT End of Session - 08/09/21 1007   ? ? Visit Number 65   ? Number of Visits 67   ? Date for PT Re-Evaluation 10/18/21   ? Authorization Type 7/10 PN 3/9   ? PT Start Time 1015   ? PT Stop Time 1059   ? PT Time Calculation (min) 44 min   ? Equipment Utilized During Treatment Gait belt   ? Activity Tolerance Patient tolerated treatment well;Patient limited by fatigue   ? Behavior During Therapy [facility] for tasks assessed/performed   ? ?  ?  ? ?  ? ? ?Past Medical History:  ?Diagnosis Date  ? Abdominal pain, right upper quadrant   ? Back pain   ? Calculus of kidney 12/09/2013  ? Chronic back pain   ? unspecified  ? Chronic left shoulder pain 07/19/2015  ? Complication of anesthesia   ? Functional disorder of bladder   ? other  ? Galactorrhea 11/26/2014  ? Chronic   ? Hereditary and idiopathic neuropathy 08/19/2013  ? HPV test positive   ? Hypercholesteremia 08/19/2013  ? Hypertension   ? Incomplete bladder emptying   ? Microscopic hematuria   ? MS (multiple sclerosis) ([place])   ? Muscle spasticity 05/21/2014  ? Nonspecific findings on examination of urine   ? other  ? Osteopenia   ? PONV (postoperative nausea and vomiting)   ? Status post laparoscopic supracervical hysterectomy 11/26/2014  ? Tobacco user 11/26/2014  ? Wrist fracture   ? ? ?Past Surgical History:  ?Procedure Laterality Date  ? bilateral tubal ligation  1996  ? BREAST CYST EXCISION Left 2002  ? FRACTURE SURGERY    ? KNEE SURGERY    ? right  ? LAPAROSCOPIC SUPRACERVICAL HYSTERECTOMY  08/05/2013  ? ORIF WRIST FRACTURE Left 01/17/2017  ? Procedure: OPEN REDUCTION INTERNAL FIXATION (ORIF) WRIST FRACTURE;  Surgeon: Lovell Sheehan, MD;  Location: [facility];  Service: Orthopedics;  Laterality: Left;  ? RADIOLOGY WITH ANESTHESIA N/A 03/18/2020  ? Procedure: MRI WITH ANESTHESIA CERVICAL SPINE AND BRAIN  WITH AND WITHOUT CONTRAST;  Surgeon: Radiologist, Medication, MD;  Location: [facility];  Service: Radiology;  Laterality: N/A;  ? TUBAL LIGATION Bilateral   ? VAGINAL HYSTERECTOMY  03/2006  ? ? ?There were no vitals filed for this visit. ? ? Subjective Assessment - 08/09/21 1107   ? ? Subjective Patient reports she is feeling good today. No falls or LOB since last session.   ? Pertinent History Patient is a pleasant 61 year old female who is returning to PT after MS exacerbation and hospitalization for COVID. Patient went to SNF for one month and then received home health PT for a month (8 visits).  PMH includes HTN, SVT, neurogenic bowel, MS (1995), neuropathy, hx of wrist fx, HPV, Hypercholesteremia, adiposity, osteopenia, spasticity. She wears bilateral AFOs and uses a RW and/or wheelchair for mobility. She drives an adapted car.   ? Limitations Lifting;Standing;Walking;House hold activities;Sitting   ? How long can you sit comfortably? n/a   ? How long can you stand comfortably? 2 minutes   ? How long can you walk comfortably? 40 ft in house   ? Currently in Pain?  No/denies   ? ?  ?  ? ?  ? ? ? ? ? ? ? ? ?TherAct:  ?Stand pivot from wheelchair to plinth table with CGA ?Seated to supine position: mod A for bringing LE onto table:  ?Supine to seated position: mod A for trunk and LE's  ?Stand pivot from raised plinth table to chair with CGA ?  ?TherEx:  ?  ?Supine: ?Abduction/adduction leg wipes AAROM 10x each LE (added to HEP) ?Knees on bolster: SAQ 10x each LE; x2 sets each LE ?Modified bridge with significant stabilization to LE's 10x; x 2 sets  ?Overhead raise with GTB 10x; x 2 sets  ?  ?Seated: ?Green swiss ball TrA activation 10x 3 second holds ?Cross body crunch with elbow drive 56L each side ? lateral crunches 10x each side ?Adduction  green ball squeeze 10x ; 2 sets  ?Wood chops with nonweighted ball 10x each direction ? ?Manual: ?Hamstring lengthening with leg on PT shoulder 60 seconds each LE ?Single knee to chest 60 seconds each LE ?Cross body knee to chest 60 seconds each LE ?Piriformis lengthening stretch 60 seconds each Le ?LE rotation x2 minutes for low back pain relief ?  ?Ice pack donned throughout session to minimize exacerbation of MS symptoms in order to perform interventions ? ? ?Patient is able to transition with decreased support from PT. She is able to perform increased repetition of interventions with occasional fatigue. Patient is highly motivated throughout session. Occasional rest breaks required. The pt will benefit from further skilled PT to improve mobility, strength, pain, gait and balance in order to decrease fall risk and increase QOL ? ? ? ? ? ? ? ? ? ? ? ? ? ? ? ? ? PT Education - 08/09/21 1007   ? ? Education provided Yes   ? Education Details exercise technique, body mechanics   ? Person(s) Educated Patient   ? Methods Explanation;Demonstration;Tactile cues;Verbal cues   ? Comprehension Verbalized understanding;Returned demonstration;Verbal cues required;Tactile cues required   ? ?  ?  ? ?  ? ? ? PT Short Term Goals - 05/05/21 1253   ? ?  ? PT SHORT TERM GOAL #1  ? Title Patient will be independent in home exercise program to improve strength/mobility for better functional independence with ADLs.   ? Baseline 10/4 HEP provided 11/8: HEP compliant   ? Time 4   ? Period Weeks   ? Status Achieved   ? Target Date 03/08/21   ? ?  ?  ? ?  ? ? ? ? PT Long Term Goals - 07/26/21 0001   ? ?  ? PT LONG TERM GOAL #1  ? Title Patient will increase FOTO score to equal to or greater than  51/100   to demonstrate statistically significant improvement in mobility and quality of life.   ? Baseline 10/4: 42% 11/8: 36% 12/29: 45%; 06/09/2021= 36% 39: 36%   ? Time 12   ? Period Weeks   ? Status On-going   ? Target Date 10/18/21   ?  ? PT  LONG TERM GOAL #2  ? Title Patient (< 20 years old) will complete five times sit to stand test in < 20 seconds indicating an increased LE strength and improved balance.   ? Baseline 10/4: 33.15 seconds with heavy BUE support 11/8: 30 seconds; 12/29: deferred to future session. Pt attempted today but unable to complete d/t increase in R side LBP; 06/09/2021= 27.72 sec with UE support 3/9: 37.74 seconds  3/21: 29 seconds with BUE support   ? Time 12   ? Period Weeks   ? Status Partially Met   ? Target Date 10/18/21   ?  ? PT LONG TERM GOAL #3  ? Title Patient will increase 10 meter walk test to <20 seconds as to improve gait speed for better community ambulation and to reduce fall risk.   ? Baseline 10/4: 43.17 seconds with RW 11/8: 40.8 seconds; 43 sec (0.23 m/s) with RW; 06/09/2021= 40.7 sec with RW 3/9: 38.8 seconds   ? Time 12   ? Period Weeks   ? Status Partially Met   ? Target Date 10/18/21   ?  ? PT LONG TERM GOAL #4  ? Title Patient will reduce timed up and go to <11 seconds to reduce fall risk and demonstrate improved transfer/gait ability.   ? Baseline 10/4: 52.66 seconds with RW 11/8: 49 seconds with RW; 12/29: 44.3 sec; 06/09/2021= 46.45 sec with use of RW 3/9: 39.9   ? Time 12   ? Period Weeks   ? Status Partially Met   ? Target Date 10/18/21   ?  ? PT LONG TERM GOAL #5  ? Title Patient will transfer from her shower seat independently for safety with ADLs.   ? Baseline 3/21: unable   ? Time 12   ? Period Weeks   ? Status New   ? Target Date 10/18/21   ?  ? PT LONG TERM GOAL #6  ? Title Patient will get into bed without assistance to increase independence with functional mobility.   ? Baseline 3/21: needs assistance from sister   ? Time 12   ? Period Weeks   ? Status New   ? Target Date 10/18/21   ? ?  ?  ? ?  ? ? ? ? ? ? ? ? Plan - 08/09/21 1107   ? ? Clinical Impression Statement Patient is able to transition with decreased support from PT. She is able to perform increased repetition of interventions with  occasional fatigue. Patient is highly motivated throughout session. Occasional rest breaks required. The pt will benefit from further skilled PT to improve mobility, strength, pain, gait and balance in order

## 2021-08-11 ENCOUNTER — Ambulatory Visit: Payer: PPO

## 2021-08-11 DIAGNOSIS — R2689 Other abnormalities of gait and mobility: Secondary | ICD-10-CM

## 2021-08-11 DIAGNOSIS — M6281 Muscle weakness (generalized): Secondary | ICD-10-CM

## 2021-08-11 DIAGNOSIS — R2681 Unsteadiness on feet: Secondary | ICD-10-CM

## 2021-08-11 NOTE — Therapy (Signed)
[place] [facility] ?[place], [place], [address] ?Phone: [phone]   Fax:  [phone] ? ?Physical Therapy Treatment ? ?Patient Details  ?Name: Lisa Williams ?MRN: [id number] ?Date of Birth: 12-28-1960 ?Referring Provider (PT): Margette Fast MD ? ? ?Encounter Date: 08/11/2021 ? ? PT End of Session - 08/11/21 1230   ? ? Visit Number 48   ? Number of Visits 67   ? Date for PT Re-Evaluation 10/18/21   ? Authorization Type 8/10 PN 3/9   ? PT Start Time 1015   ? PT Stop Time 1059   ? PT Time Calculation (min) 44 min   ? Equipment Utilized During Treatment Gait belt   ? Activity Tolerance Patient tolerated treatment well;Patient limited by fatigue   ? Behavior During Therapy [facility] for tasks assessed/performed   ? ?  ?  ? ?  ? ? ?Past Medical History:  ?Diagnosis Date  ? Abdominal pain, right upper quadrant   ? Back pain   ? Calculus of kidney 12/09/2013  ? Chronic back pain   ? unspecified  ? Chronic left shoulder pain 07/19/2015  ? Complication of anesthesia   ? Functional disorder of bladder   ? other  ? Galactorrhea 11/26/2014  ? Chronic   ? Hereditary and idiopathic neuropathy 08/19/2013  ? HPV test positive   ? Hypercholesteremia 08/19/2013  ? Hypertension   ? Incomplete bladder emptying   ? Microscopic hematuria   ? MS (multiple sclerosis) ([place])   ? Muscle spasticity 05/21/2014  ? Nonspecific findings on examination of urine   ? other  ? Osteopenia   ? PONV (postoperative nausea and vomiting)   ? Status post laparoscopic supracervical hysterectomy 11/26/2014  ? Tobacco user 11/26/2014  ? Wrist fracture   ? ? ?Past Surgical History:  ?Procedure Laterality Date  ? bilateral tubal ligation  1996  ? BREAST CYST EXCISION Left 2002  ? FRACTURE SURGERY    ? KNEE SURGERY    ? right  ? LAPAROSCOPIC SUPRACERVICAL HYSTERECTOMY  08/05/2013  ? ORIF WRIST FRACTURE Left 01/17/2017  ? Procedure: OPEN REDUCTION INTERNAL FIXATION (ORIF) WRIST FRACTURE;  Surgeon: Lovell Sheehan, MD;  Location: [facility];  Service: Orthopedics;  Laterality: Left;  ? RADIOLOGY WITH ANESTHESIA N/A 03/18/2020  ? Procedure: MRI WITH ANESTHESIA CERVICAL SPINE AND BRAIN  WITH AND WITHOUT CONTRAST;  Surgeon: Radiologist, Medication, MD;  Location: [facility];  Service: Radiology;  Laterality: N/A;  ? TUBAL LIGATION Bilateral   ? VAGINAL HYSTERECTOMY  03/2006  ? ? ?There were no vitals filed for this visit. ? ? Subjective Assessment - 08/11/21 1229   ? ? Subjective Patient reports it is warm out today. Is feeling good. No falls or LOB since last session.   ? Pertinent History Patient is a pleasant 61 year old female who is returning to PT after MS exacerbation and hospitalization for COVID. Patient went to SNF for one month and then received home health PT for a month (8 visits).  PMH includes HTN, SVT, neurogenic bowel, MS (1995), neuropathy, hx of wrist fx, HPV, Hypercholesteremia, adiposity, osteopenia, spasticity. She wears bilateral AFOs and uses a RW and/or wheelchair for mobility. She drives an adapted car.   ? Limitations Lifting;Standing;Walking;House hold activities;Sitting   ? How long can you sit comfortably? n/a   ? How long can you stand comfortably? 2 minutes   ? How long can you walk comfortably? 40 ft in house   ?  Currently in Pain? No/denies   ? ?  ?  ? ?  ? ? ? ? ?Standing/walking with RW ?-walking with bRW, CGA and wheelchair follow, use of ice. Total of 100 ft, 1 rest break. Patient has decreased foot clearance bilaterally, R>L as she fatigues. Heavy BUE support required as patient fatigued. She is able to negotiate changing floor surfaces without LOB ?  ? ?Seated: ?  ?Seated on dynadisc:  ? -static sit arms crossed 60 seconds ?-anterior posterior trunk lean 10x each direction ?-medial lateral trunk lean outside BOS 10x each LE ?-alternating UE raises 10x each UE ?-coordinated clapping with PT x 3 minutes ? ?  ?GTB row 12x ?GTB abduction 12x each LE ?GTB adduction 12x each LE  ?BTB overhead Y  15x  ?  ?Ice pack donned throughout session to minimize exacerbation of MS symptoms in order to perform interventions ?  ?  ?Pt educated throughout session about proper posture and technique with exercises. Improved exercise technique, movement at target joints, use of target muscles after min to mod verbal, visual, tactile cues ? ? ? ?Patient core activation tolerated well with no LOB. She does fatigue with ambulation with increased hyperextension requiring termination after 100 ft . Patient has excellent motivation and does show significant improvements during session. The pt will benefit from further skilled PT to improve mobility, strength, pain, gait and balance in order to decrease fall risk and increase QOL ? ? ? ? ? ? ? ? ? ? ? ? ? ? ? ? ? ? ? ? PT Education - 08/11/21 1230   ? ? Education provided Yes   ? Education Details exercise technique, body mechanics   ? Person(s) Educated Patient   ? Methods Explanation;Demonstration;Tactile cues;Verbal cues   ? Comprehension Verbalized understanding;Returned demonstration;Verbal cues required;Tactile cues required   ? ?  ?  ? ?  ? ? ? PT Short Term Goals - 05/05/21 1253   ? ?  ? PT SHORT TERM GOAL #1  ? Title Patient will be independent in home exercise program to improve strength/mobility for better functional independence with ADLs.   ? Baseline 10/4 HEP provided 11/8: HEP compliant   ? Time 4   ? Period Weeks   ? Status Achieved   ? Target Date 03/08/21   ? ?  ?  ? ?  ? ? ? ? PT Long Term Goals - 07/26/21 0001   ? ?  ? PT LONG TERM GOAL #1  ? Title Patient will increase FOTO score to equal to or greater than  51/100   to demonstrate statistically significant improvement in mobility and quality of life.   ? Baseline 10/4: 42% 11/8: 36% 12/29: 45%; 06/09/2021= 36% 39: 36%   ? Time 12   ? Period Weeks   ? Status On-going   ? Target Date 10/18/21   ?  ? PT LONG TERM GOAL #2  ? Title Patient (< 44 years old) will complete five times sit to stand test in < 20 seconds  indicating an increased LE strength and improved balance.   ? Baseline 10/4: 33.15 seconds with heavy BUE support 11/8: 30 seconds; 12/29: deferred to future session. Pt attempted today but unable to complete d/t increase in R side LBP; 06/09/2021= 27.72 sec with UE support 3/9: 37.74 seconds 3/21: 29 seconds with BUE support   ? Time 12   ? Period Weeks   ? Status Partially Met   ? Target Date 10/18/21   ?  ?  PT LONG TERM GOAL #3  ? Title Patient will increase 10 meter walk test to <20 seconds as to improve gait speed for better community ambulation and to reduce fall risk.   ? Baseline 10/4: 43.17 seconds with RW 11/8: 40.8 seconds; 43 sec (0.23 m/s) with RW; 06/09/2021= 40.7 sec with RW 3/9: 38.8 seconds   ? Time 12   ? Period Weeks   ? Status Partially Met   ? Target Date 10/18/21   ?  ? PT LONG TERM GOAL #4  ? Title Patient will reduce timed up and go to <11 seconds to reduce fall risk and demonstrate improved transfer/gait ability.   ? Baseline 10/4: 52.66 seconds with RW 11/8: 49 seconds with RW; 12/29: 44.3 sec; 06/09/2021= 46.45 sec with use of RW 3/9: 39.9   ? Time 12   ? Period Weeks   ? Status Partially Met   ? Target Date 10/18/21   ?  ? PT LONG TERM GOAL #5  ? Title Patient will transfer from her shower seat independently for safety with ADLs.   ? Baseline 3/21: unable   ? Time 12   ? Period Weeks   ? Status New   ? Target Date 10/18/21   ?  ? PT LONG TERM GOAL #6  ? Title Patient will get into bed without assistance to increase independence with functional mobility.   ? Baseline 3/21: needs assistance from sister   ? Time 12   ? Period Weeks   ? Status New   ? Target Date 10/18/21   ? ?  ?  ? ?  ? ? ? ? ? ? ? ? Plan - 08/11/21 1234   ? ? Clinical Impression Statement Patient core activation tolerated well with no LOB. She does fatigue with ambulation with increased hyperextension requiring termination after 100 ft . Patient has excellent motivation and does show significant improvements during session. The  pt will benefit from further skilled PT to improve mobility, strength, pain, gait and balance in order to decrease fall risk and increase QOL   ? Personal Factors and Comorbidities Age;Comorbidity 3+;Fin

## 2021-08-16 ENCOUNTER — Ambulatory Visit: Payer: PPO

## 2021-08-16 DIAGNOSIS — M6281 Muscle weakness (generalized): Secondary | ICD-10-CM

## 2021-08-16 DIAGNOSIS — R2681 Unsteadiness on feet: Secondary | ICD-10-CM

## 2021-08-16 DIAGNOSIS — R2689 Other abnormalities of gait and mobility: Secondary | ICD-10-CM

## 2021-08-16 NOTE — Therapy (Signed)
[place] [facility] ?[place], [place], [address] ?Phone: [phone]   Fax:  [phone] ? ?Physical Therapy Treatment ? ?Patient Details  ?Name: Lisa Williams ?MRN: [id number] ?Date of Birth: 08/09/1960 ?Referring Provider (PT): Margette Fast MD ? ? ?Encounter Date: 08/16/2021 ? ? PT End of Session - 08/16/21 0957   ? ? Visit Number 43   ? Number of Visits 67   ? Date for PT Re-Evaluation 10/18/21   ? Authorization Type 9/10 PN 3/9   ? PT Start Time 1015   ? PT Stop Time 1059   ? PT Time Calculation (min) 44 min   ? Equipment Utilized During Treatment Gait belt   ? Activity Tolerance Patient tolerated treatment well;Patient limited by fatigue   ? Behavior During Therapy [facility] for tasks assessed/performed   ? ?  ?  ? ?  ? ? ?Past Medical History:  ?Diagnosis Date  ? Abdominal pain, right upper quadrant   ? Back pain   ? Calculus of kidney 12/09/2013  ? Chronic back pain   ? unspecified  ? Chronic left shoulder pain 07/19/2015  ? Complication of anesthesia   ? Functional disorder of bladder   ? other  ? Galactorrhea 11/26/2014  ? Chronic   ? Hereditary and idiopathic neuropathy 08/19/2013  ? HPV test positive   ? Hypercholesteremia 08/19/2013  ? Hypertension   ? Incomplete bladder emptying   ? Microscopic hematuria   ? MS (multiple sclerosis) ([place])   ? Muscle spasticity 05/21/2014  ? Nonspecific findings on examination of urine   ? other  ? Osteopenia   ? PONV (postoperative nausea and vomiting)   ? Status post laparoscopic supracervical hysterectomy 11/26/2014  ? Tobacco user 11/26/2014  ? Wrist fracture   ? ? ?Past Surgical History:  ?Procedure Laterality Date  ? bilateral tubal ligation  1996  ? BREAST CYST EXCISION Left 2002  ? FRACTURE SURGERY    ? KNEE SURGERY    ? right  ? LAPAROSCOPIC SUPRACERVICAL HYSTERECTOMY  08/05/2013  ? ORIF WRIST FRACTURE Left 01/17/2017  ? Procedure: OPEN REDUCTION INTERNAL FIXATION (ORIF) WRIST FRACTURE;  Surgeon: Lovell Sheehan, MD;  Location: [facility];  Service: Orthopedics;  Laterality: Left;  ? RADIOLOGY WITH ANESTHESIA N/A 03/18/2020  ? Procedure: MRI WITH ANESTHESIA CERVICAL SPINE AND BRAIN  WITH AND WITHOUT CONTRAST;  Surgeon: Radiologist, Medication, MD;  Location: [facility];  Service: Radiology;  Laterality: N/A;  ? TUBAL LIGATION Bilateral   ? VAGINAL HYSTERECTOMY  03/2006  ? ? ?There were no vitals filed for this visit. ? ? Subjective Assessment - 08/16/21 1111   ? ? Subjective Patient reports she is feeling well today, will start doing core workouts with her sister now that she is retired.   ? Pertinent History Patient is a pleasant 61 year old female who is returning to PT after MS exacerbation and hospitalization for COVID. Patient went to SNF for one month and then received home health PT for a month (8 visits).  PMH includes HTN, SVT, neurogenic bowel, MS (1995), neuropathy, hx of wrist fx, HPV, Hypercholesteremia, adiposity, osteopenia, spasticity. She wears bilateral AFOs and uses a RW and/or wheelchair for mobility. She drives an adapted car.   ? Limitations Lifting;Standing;Walking;House hold activities;Sitting   ? How long can you sit comfortably? n/a   ? How long can you stand comfortably? 2 minutes   ? How long can you walk comfortably? 40 ft in house   ?  Currently in Pain? No/denies   ? ?  ?  ? ?  ? ? ? ? ? ? ? ?standing ?Ambulate 163 ft with bRW and wheelchair follow. One rest break required. Improved foot clearance initially but did decrease as patient fatigued and increased reliance upon [place].  ?  ?  ?  ?Seated: ?  ?Cross body knee to elbow core activation 10x each side  ? ?Green theraband ?-GTB row 15x ?-D1 patterning 10x each arm ?-overhead Y raise 10x ?-ER 10x with cues for elbows at side  ?  ?  ?  ?Ice pack donned throughout session to minimize exacerbation of MS symptoms in order to perform interventions ? ? ? ?Pt educated throughout session about proper posture and technique with exercises. Improved  exercise technique, movement at target joints, use of target muscles after min to mod verbal, visual, tactile cues. ? ? ?Patient ambulates for longest duration with one rest break since returning to therapy. She is highly motivated and her progress can be seen in her ambulation capacity. The pt will benefit from further skilled PT to improve mobility, strength, pain, gait and balance in order to decrease fall risk and increase QOL ? ? ? ? ? ? ? ? ? ? ? ? ? ? ? PT Education - 08/16/21 0956   ? ? Education provided Yes   ? Education Details exercise technique, body mechanics   ? Person(s) Educated Patient   ? Methods Explanation;Demonstration;Tactile cues;Verbal cues   ? Comprehension Verbalized understanding;Returned demonstration;Verbal cues required;Tactile cues required   ? ?  ?  ? ?  ? ? ? PT Short Term Goals - 05/05/21 1253   ? ?  ? PT SHORT TERM GOAL #1  ? Title Patient will be independent in home exercise program to improve strength/mobility for better functional independence with ADLs.   ? Baseline 10/4 HEP provided 11/8: HEP compliant   ? Time 4   ? Period Weeks   ? Status Achieved   ? Target Date 03/08/21   ? ?  ?  ? ?  ? ? ? ? PT Long Term Goals - 07/26/21 0001   ? ?  ? PT LONG TERM GOAL #1  ? Title Patient will increase FOTO score to equal to or greater than  51/100   to demonstrate statistically significant improvement in mobility and quality of life.   ? Baseline 10/4: 42% 11/8: 36% 12/29: 45%; 06/09/2021= 36% 39: 36%   ? Time 12   ? Period Weeks   ? Status On-going   ? Target Date 10/18/21   ?  ? PT LONG TERM GOAL #2  ? Title Patient (< 23 years old) will complete five times sit to stand test in < 20 seconds indicating an increased LE strength and improved balance.   ? Baseline 10/4: 33.15 seconds with heavy BUE support 11/8: 30 seconds; 12/29: deferred to future session. Pt attempted today but unable to complete d/t increase in R side LBP; 06/09/2021= 27.72 sec with UE support 3/9: 37.74 seconds 3/21: 29  seconds with BUE support   ? Time 12   ? Period Weeks   ? Status Partially Met   ? Target Date 10/18/21   ?  ? PT LONG TERM GOAL #3  ? Title Patient will increase 10 meter walk test to <20 seconds as to improve gait speed for better community ambulation and to reduce fall risk.   ? Baseline 10/4: 43.17 seconds with RW 11/8: 40.8 seconds; 43  sec (0.23 m/s) with RW; 06/09/2021= 40.7 sec with RW 3/9: 38.8 seconds   ? Time 12   ? Period Weeks   ? Status Partially Met   ? Target Date 10/18/21   ?  ? PT LONG TERM GOAL #4  ? Title Patient will reduce timed up and go to <11 seconds to reduce fall risk and demonstrate improved transfer/gait ability.   ? Baseline 10/4: 52.66 seconds with RW 11/8: 49 seconds with RW; 12/29: 44.3 sec; 06/09/2021= 46.45 sec with use of RW 3/9: 39.9   ? Time 12   ? Period Weeks   ? Status Partially Met   ? Target Date 10/18/21   ?  ? PT LONG TERM GOAL #5  ? Title Patient will transfer from her shower seat independently for safety with ADLs.   ? Baseline 3/21: unable   ? Time 12   ? Period Weeks   ? Status New   ? Target Date 10/18/21   ?  ? PT LONG TERM GOAL #6  ? Title Patient will get into bed without assistance to increase independence with functional mobility.   ? Baseline 3/21: needs assistance from sister   ? Time 12   ? Period Weeks   ? Status New   ? Target Date 10/18/21   ? ?  ?  ? ?  ? ? ? ? ? ? ? ? Plan - 08/16/21 1112   ? ? Clinical Impression Statement Patient ambulates for longest duration with one rest break since returning to therapy. She is highly motivated and her progress can be seen in her ambulation capacity. The pt will benefit from further skilled PT to improve mobility, strength, pain, gait and balance in order to decrease fall risk and increase QOL   ? Personal Factors and Comorbidities Age;Comorbidity 3+;Finances;Fitness;Past/Current Experience;Sex;Social Background;Time since onset of injury/illness/exacerbation;Transportation   ? Comorbidities HTN, SVT, neurogenic bowel,  MS (1995), neuropathy, hx of wrist fx, HPV, Hypercholesteremia, adiposity, osteopenia, spasticity.   ? Examination-Activity Limitations Bathing;Bed Mobility;Bend;Carry;Continence;Dressing;Hygiene/Grooming;St

## 2021-08-18 ENCOUNTER — Ambulatory Visit: Payer: PPO

## 2021-08-18 DIAGNOSIS — R2681 Unsteadiness on feet: Secondary | ICD-10-CM

## 2021-08-18 DIAGNOSIS — R2689 Other abnormalities of gait and mobility: Secondary | ICD-10-CM

## 2021-08-18 DIAGNOSIS — M6281 Muscle weakness (generalized): Secondary | ICD-10-CM

## 2021-08-18 NOTE — Therapy (Signed)
[place] [facility] ?[place], [place], [address] ?Phone: [phone]   Fax:  [phone] ? ?Physical Therapy Treatment/ Physical Therapy Progress Note ? ? ?Dates of reporting period  07/14/21   to   08/18/21  ? ?Patient Details  ?Name: Lisa Williams ?MRN: [id number] ?Date of Birth: [birth year] ?Referring Provider (PT): Margette Fast MD ? ? ?Encounter Date: 08/18/2021 ? ? PT End of Session - 08/18/21 1239   ? ? Visit Number [id number]   ? Number of Visits 67   ? Date for PT Re-Evaluation 10/18/21   ? Authorization Type 10/10 PN 3/9; next session 1/10 PN 08/18/21   ? PT Start Time 1015   ? PT Stop Time 1059   ? PT Time Calculation (min) 44 min   ? Equipment Utilized During Treatment Gait belt   ? Activity Tolerance Patient tolerated treatment well;Patient limited by fatigue   ? Behavior During Therapy [facility] for tasks assessed/performed   ? ?  ?  ? ?  ? ? ?Past Medical History:  ?Diagnosis Date  ? Abdominal pain, right upper quadrant   ? Back pain   ? Calculus of kidney 12/09/2013  ? Chronic back pain   ? unspecified  ? Chronic left shoulder pain 07/19/2015  ? Complication of anesthesia   ? Functional disorder of bladder   ? other  ? Galactorrhea 11/26/2014  ? Chronic   ? Hereditary and idiopathic neuropathy 08/19/2013  ? HPV test positive   ? Hypercholesteremia 08/19/2013  ? Hypertension   ? Incomplete bladder emptying   ? Microscopic hematuria   ? MS (multiple sclerosis) ([place])   ? Muscle spasticity 05/21/2014  ? Nonspecific findings on examination of urine   ? other  ? Osteopenia   ? PONV (postoperative nausea and vomiting)   ? Status post laparoscopic supracervical hysterectomy 11/26/2014  ? Tobacco user 11/26/2014  ? Wrist fracture   ? ? ?Past Surgical History:  ?Procedure Laterality Date  ? bilateral tubal ligation  1996  ? BREAST CYST EXCISION Left 2002  ? FRACTURE SURGERY    ? KNEE SURGERY    ? right  ? LAPAROSCOPIC SUPRACERVICAL HYSTERECTOMY  08/05/2013  ? ORIF  WRIST FRACTURE Left 01/17/2017  ? Procedure: OPEN REDUCTION INTERNAL FIXATION (ORIF) WRIST FRACTURE;  Surgeon: Lovell Sheehan, MD;  Location: [facility];  Service: Orthopedics;  Laterality: Left;  ? RADIOLOGY WITH ANESTHESIA N/A 03/18/2020  ? Procedure: MRI WITH ANESTHESIA CERVICAL SPINE AND BRAIN  WITH AND WITHOUT CONTRAST;  Surgeon: Radiologist, Medication, MD;  Location: [facility];  Service: Radiology;  Laterality: N/A;  ? TUBAL LIGATION Bilateral   ? VAGINAL HYSTERECTOMY  03/2006  ? ? ?There were no vitals filed for this visit. ? ? Subjective Assessment - 08/18/21 1233   ? ? Subjective Patient reports she is having increased spasms today.   ? Pertinent History Patient is a pleasant 61 year old female who is returning to PT after MS exacerbation and hospitalization for COVID. Patient went to SNF for one month and then received home health PT for a month (8 visits).  PMH includes HTN, SVT, neurogenic bowel, MS (1995), neuropathy, hx of wrist fx, HPV, Hypercholesteremia, adiposity, osteopenia, spasticity. She wears bilateral AFOs and uses a RW and/or wheelchair for mobility. She drives an adapted car.   ? Limitations Lifting;Standing;Walking;House hold activities;Sitting   ? How long can you sit comfortably? n/a   ? How long can you stand comfortably? 2 minutes   ?  How long can you walk comfortably? 40 ft in house   ? Currently in Pain? No/denies   ? ?  ?  ? ?  ? ? ? ? ? ? ? ?TherAct:  ?Stand pivot from wheelchair to plinth table with CGA ?Seated to supine position: mod A for bringing LE onto table:  ?Supine to seated position: mod A for trunk and LE's  ?Stand pivot from raised plinth table to chair with CGA ?  ?TherEx:  ?  ?Supine: ?Knees on bolster: SAQ 10x each LE; x2 sets each LE ?Modified bridge with significant stabilization to LE's 10x; x 2 sets  ?Overhead raise with PVC pipe 10x; x 2 sets  ?TrA activation pressing into swiss ball 10x ; 2 sets  ?  ? ?  ?Manual: ?Hamstring lengthening with leg on PT shoulder 60  seconds each LE ?Single knee to chest 60 seconds each LE ?Cross body knee to chest 60 seconds each LE ?Piriformis lengthening stretch 60 seconds each Le ?LE rotation x2 minutes for low back pain relief ?  ?Seated:  ?STM of R low back with implementation of effleurage and p?trissage x 6 minutes   ? ?Ice pack donned throughout session to minimize exacerbation of MS symptoms in order to perform interventions ? ? ? ?Patient's condition has the potential to improve in response to therapy. Maximum improvement is yet to be obtained. The anticipated improvement is attainable and reasonable in a generally predictable time.  Patient reports she is improving in her mobility.  ? ? ? ?Patient's goals performed 07/26/21 please refer to this note for further details. Goals deferred due to back pain/spasms this session. Patient has decreased pain by end of session. Patient's condition has the potential to improve in response to therapy. Maximum improvement is yet to be obtained. The anticipated improvement is attainable and reasonable in a generally predictable time.  The pt will benefit from further skilled PT to improve mobility, strength, pain, gait and balance in order to decrease fall risk and increase QOL ? ? ? ? ? ? ? ? ? ? ? ? ? ? PT Education - 08/18/21 1235   ? ? Education provided Yes   ? Education Details exercise technique, body mechanics   ? Person(s) Educated Patient   ? Methods Explanation;Demonstration;Tactile cues;Verbal cues   ? Comprehension Verbalized understanding;Returned demonstration;Verbal cues required;Tactile cues required   ? ?  ?  ? ?  ? ? ? PT Short Term Goals - 05/05/21 1253   ? ?  ? PT SHORT TERM GOAL #1  ? Title Patient will be independent in home exercise program to improve strength/mobility for better functional independence with ADLs.   ? Baseline 10/4 HEP provided 11/8: HEP compliant   ? Time 4   ? Period Weeks   ? Status Achieved   ? Target Date 03/08/21   ? ?  ?  ? ?  ? ? ? ? PT Long Term  Goals - 07/26/21 0001   ? ?  ? PT LONG TERM GOAL #1  ? Title Patient will increase FOTO score to equal to or greater than  51/100   to demonstrate statistically significant improvement in mobility and quality of life.   ? Baseline 10/4: 42% 11/8: 36% 12/29: 45%; 06/09/2021= 36% 39: 36%   ? Time 12   ? Period Weeks   ? Status On-going   ? Target Date 10/18/21   ?  ? PT LONG TERM GOAL #2  ? Title Patient (<  10 years old) will complete five times sit to stand test in < 20 seconds indicating an increased LE strength and improved balance.   ? Baseline 10/4: 33.15 seconds with heavy BUE support 11/8: 30 seconds; 12/29: deferred to future session. Pt attempted today but unable to complete d/t increase in R side LBP; 06/09/2021= 27.72 sec with UE support 3/9: 37.74 seconds 3/21: 29 seconds with BUE support   ? Time 12   ? Period Weeks   ? Status Partially Met   ? Target Date 10/18/21   ?  ? PT LONG TERM GOAL #3  ? Title Patient will increase 10 meter walk test to <20 seconds as to improve gait speed for better community ambulation and to reduce fall risk.   ? Baseline 10/4: 43.17 seconds with RW 11/8: 40.8 seconds; 43 sec (0.23 m/s) with RW; 06/09/2021= 40.7 sec with RW 3/9: 38.8 seconds   ? Time 12   ? Period Weeks   ? Status Partially Met   ? Target Date 10/18/21   ?  ? PT LONG TERM GOAL #4  ? Title Patient will reduce timed up and go to <11 seconds to reduce fall risk and demonstrate improved transfer/gait ability.   ? Baseline 10/4: 52.66 seconds with RW 11/8: 49 seconds with RW; 12/29: 44.3 sec; 06/09/2021= 46.45 sec with use of RW 3/9: 39.9   ? Time 12   ? Period Weeks   ? Status Partially Met   ? Target Date 10/18/21   ?  ? PT LONG TERM GOAL #5  ? Title Patient will transfer from her shower seat independently for safety with ADLs.   ? Baseline 3/21: unable   ? Time 12   ? Period Weeks   ? Status New   ? Target Date 10/18/21   ?  ? PT LONG TERM GOAL #6  ? Title Patient will get into bed without assistance to increase  independence with functional mobility.   ? Baseline 3/21: needs assistance from sister   ? Time 12   ? Period Weeks   ? Status New   ? Target Date 10/18/21   ? ?  ?  ? ?  ? ? ? ? ? ? ? ? Plan - 08/18/21 1242   ? ? Cl

## 2021-08-23 ENCOUNTER — Ambulatory Visit: Payer: PPO

## 2021-08-23 DIAGNOSIS — R2689 Other abnormalities of gait and mobility: Secondary | ICD-10-CM

## 2021-08-23 DIAGNOSIS — M6281 Muscle weakness (generalized): Secondary | ICD-10-CM

## 2021-08-23 DIAGNOSIS — R2681 Unsteadiness on feet: Secondary | ICD-10-CM

## 2021-08-23 NOTE — Therapy (Signed)
[place] [facility] ?[place], [place], [address] ?Phone: [phone]   Fax:  [phone] ? ?Physical Therapy Treatment ? ?Patient Details  ?Name: Lisa Williams ?MRN: [id number] ?Date of Birth: 11/19/1960 ?Referring Provider (PT): Margette Fast MD ? ? ?Encounter Date: 08/23/2021 ? ? PT End of Session - 08/23/21 1106   ? ? Visit Number 51   ? Number of Visits 67   ? Date for PT Re-Evaluation 10/18/21   ? Authorization Type 1/10 PN 08/18/21   ? PT Start Time 1015   ? PT Stop Time 1059   ? PT Time Calculation (min) 44 min   ? Equipment Utilized During Treatment Gait belt   ? Activity Tolerance Patient tolerated treatment well;Patient limited by fatigue   ? Behavior During Therapy [facility] for tasks assessed/performed   ? ?  ?  ? ?  ? ? ?Past Medical History:  ?Diagnosis Date  ? Abdominal pain, right upper quadrant   ? Back pain   ? Calculus of kidney 12/09/2013  ? Chronic back pain   ? unspecified  ? Chronic left shoulder pain 07/19/2015  ? Complication of anesthesia   ? Functional disorder of bladder   ? other  ? Galactorrhea 11/26/2014  ? Chronic   ? Hereditary and idiopathic neuropathy 08/19/2013  ? HPV test positive   ? Hypercholesteremia 08/19/2013  ? Hypertension   ? Incomplete bladder emptying   ? Microscopic hematuria   ? MS (multiple sclerosis) ([place])   ? Muscle spasticity 05/21/2014  ? Nonspecific findings on examination of urine   ? other  ? Osteopenia   ? PONV (postoperative nausea and vomiting)   ? Status post laparoscopic supracervical hysterectomy 11/26/2014  ? Tobacco user 11/26/2014  ? Wrist fracture   ? ? ?Past Surgical History:  ?Procedure Laterality Date  ? bilateral tubal ligation  1996  ? BREAST CYST EXCISION Left 2002  ? FRACTURE SURGERY    ? KNEE SURGERY    ? right  ? LAPAROSCOPIC SUPRACERVICAL HYSTERECTOMY  08/05/2013  ? ORIF WRIST FRACTURE Left 01/17/2017  ? Procedure: OPEN REDUCTION INTERNAL FIXATION (ORIF) WRIST FRACTURE;  Surgeon:  Lovell Sheehan, MD;  Location: [facility];  Service: Orthopedics;  Laterality: Left;  ? RADIOLOGY WITH ANESTHESIA N/A 03/18/2020  ? Procedure: MRI WITH ANESTHESIA CERVICAL SPINE AND BRAIN  WITH AND WITHOUT CONTRAST;  Surgeon: Radiologist, Medication, MD;  Location: [facility];  Service: Radiology;  Laterality: N/A;  ? TUBAL LIGATION Bilateral   ? VAGINAL HYSTERECTOMY  03/2006  ? ? ?There were no vitals filed for this visit. ? ? Subjective Assessment - 08/23/21 1105   ? ? Subjective Patient reports she is doing well today. Is losing her voice due to the colder weather.   ? Pertinent History Patient is a pleasant 61 year old female who is returning to PT after MS exacerbation and hospitalization for COVID. Patient went to SNF for one month and then received home health PT for a month (8 visits).  PMH includes HTN, SVT, neurogenic bowel, MS (1995), neuropathy, hx of wrist fx, HPV, Hypercholesteremia, adiposity, osteopenia, spasticity. She wears bilateral AFOs and uses a RW and/or wheelchair for mobility. She drives an adapted car.   ? Limitations Lifting;Standing;Walking;House hold activities;Sitting   ? How long can you sit comfortably? n/a   ? How long can you stand comfortably? 2 minutes   ? How long can you walk comfortably? 40 ft in house   ? Currently  in Pain? No/denies   ? ?  ?  ? ?  ? ? ? ? ? ? ?standing ?Ambulate 103 ft with bRW and wheelchair follow. One rest break required. Improved foot clearance initially but did decrease as patient fatigued and increased reliance upon [place]. Patient able to ambulate 90 ft without stopping.  ? ?  ?Seated: ?overhead Y raise 10x ?Seated on dynadisc: ?-static sit 60 seconds ?-anterior/posterior trunk lean 10x each side ?-medial/lateral trunk lean 10x each side ?-clapping hands in rhythmic pattern for pertubation, muscle recruitment, and sequencing x 2 minutes ?-pool noodle "sword fight" for core activation and pertubations x 60 seconds ?  ?  ?  ?Ice pack donned throughout session to  minimize exacerbation of MS symptoms in order to perform interventions ?  ?  ?  ?Pt educated throughout session about proper posture and technique with exercises. Improved exercise technique, movement at target joints, use of target muscles after min to mod verbal, visual, tactile cue ? ? ?Patient is able to ambulate 90 ft prior to seated rest break with good foot clearance this session. Core activation against pertubation's on unstable surfaces tolerated well. She is highly motivated throughout session. The pt will benefit from further skilled PT to improve mobility, strength, pain, gait and balance in order to decrease fall risk and increase QOL ? ? ? ? ? ? ? ? ? ? ? ? ? ? ? ? ? ? ? PT Education - 08/23/21 1106   ? ? Education provided Yes   ? Education Details exercise technique, body mechanics   ? Person(s) Educated Patient   ? Methods Explanation;Demonstration;Tactile cues;Verbal cues   ? Comprehension Other (comment);Verbalized understanding;Returned demonstration;Verbal cues required;Tactile cues required   ? ?  ?  ? ?  ? ? ? PT Short Term Goals - 05/05/21 1253   ? ?  ? PT SHORT TERM GOAL #1  ? Title Patient will be independent in home exercise program to improve strength/mobility for better functional independence with ADLs.   ? Baseline 10/4 HEP provided 11/8: HEP compliant   ? Time 4   ? Period Weeks   ? Status Achieved   ? Target Date 03/08/21   ? ?  ?  ? ?  ? ? ? ? PT Long Term Goals - 07/26/21 0001   ? ?  ? PT LONG TERM GOAL #1  ? Title Patient will increase FOTO score to equal to or greater than  51/100   to demonstrate statistically significant improvement in mobility and quality of life.   ? Baseline 10/4: 42% 11/8: 36% 12/29: 45%; 06/09/2021= 36% 39: 36%   ? Time 12   ? Period Weeks   ? Status On-going   ? Target Date 10/18/21   ?  ? PT LONG TERM GOAL #2  ? Title Patient (< 31 years old) will complete five times sit to stand test in < 20 seconds indicating an increased LE strength and improved  balance.   ? Baseline 10/4: 33.15 seconds with heavy BUE support 11/8: 30 seconds; 12/29: deferred to future session. Pt attempted today but unable to complete d/t increase in R side LBP; 06/09/2021= 27.72 sec with UE support 3/9: 37.74 seconds 3/21: 29 seconds with BUE support   ? Time 12   ? Period Weeks   ? Status Partially Met   ? Target Date 10/18/21   ?  ? PT LONG TERM GOAL #3  ? Title Patient will increase 10 meter walk test to <  20 seconds as to improve gait speed for better community ambulation and to reduce fall risk.   ? Baseline 10/4: 43.17 seconds with RW 11/8: 40.8 seconds; 43 sec (0.23 m/s) with RW; 06/09/2021= 40.7 sec with RW 3/9: 38.8 seconds   ? Time 12   ? Period Weeks   ? Status Partially Met   ? Target Date 10/18/21   ?  ? PT LONG TERM GOAL #4  ? Title Patient will reduce timed up and go to <11 seconds to reduce fall risk and demonstrate improved transfer/gait ability.   ? Baseline 10/4: 52.66 seconds with RW 11/8: 49 seconds with RW; 12/29: 44.3 sec; 06/09/2021= 46.45 sec with use of RW 3/9: 39.9   ? Time 12   ? Period Weeks   ? Status Partially Met   ? Target Date 10/18/21   ?  ? PT LONG TERM GOAL #5  ? Title Patient will transfer from her shower seat independently for safety with ADLs.   ? Baseline 3/21: unable   ? Time 12   ? Period Weeks   ? Status New   ? Target Date 10/18/21   ?  ? PT LONG TERM GOAL #6  ? Title Patient will get into bed without assistance to increase independence with functional mobility.   ? Baseline 3/21: needs assistance from sister   ? Time 12   ? Period Weeks   ? Status New   ? Target Date 10/18/21   ? ?  ?  ? ?  ? ? ? ? ? ? ? ? Plan - 08/23/21 1116   ? ? Clinical Impression Statement Patient is able to ambulate 90 ft prior to seated rest break with good foot clearance this session. Core activation against pertubation's on unstable surfaces tolerated well. She is highly motivated throughout session. The pt will benefit from further skilled PT to improve mobility, strength,  pain, gait and balance in order to decrease fall risk and increase QOL   ? Personal Factors and Comorbidities Age;Comorbidity 3+;Finances;Fitness;Past/Current Experience;Sex;Social Background;Time since onset of

## 2021-08-24 ENCOUNTER — Other Ambulatory Visit: Payer: Self-pay | Admitting: *Deleted

## 2021-08-24 DIAGNOSIS — G35 Multiple sclerosis: Secondary | ICD-10-CM

## 2021-08-24 MED ORDER — MAYZENT 2 MG PO TABS: 2.0000 mg | ORAL_TABLET | Freq: Every day | ORAL | 5 refills | Status: DC

## 2021-08-25 ENCOUNTER — Ambulatory Visit: Payer: PPO

## 2021-08-25 DIAGNOSIS — M6281 Muscle weakness (generalized): Secondary | ICD-10-CM

## 2021-08-25 DIAGNOSIS — R2681 Unsteadiness on feet: Secondary | ICD-10-CM

## 2021-08-25 DIAGNOSIS — R2689 Other abnormalities of gait and mobility: Secondary | ICD-10-CM

## 2021-08-25 NOTE — Therapy (Signed)
[place] [facility] ?[place], [place], [address] ?Phone: [phone]   Fax:  [phone] ? ?Physical Therapy Treatment ? ?Patient Details  ?Name: Lisa Williams ?MRN: [id number] ?Date of Birth: [birth year] ?Referring Provider (PT): Margette Fast MD ? ? ?Encounter Date: 08/25/2021 ? ? PT End of Session - 08/25/21 5462   ? ? Visit Number [id number]   ? Number of Visits 67   ? Date for PT Re-Evaluation 10/18/21   ? Authorization Type 2/10 PN 08/18/21   ? PT Start Time 1015   ? PT Stop Time 1059   ? PT Time Calculation (min) 44 min   ? Equipment Utilized During Treatment Gait belt   ? Activity Tolerance Patient tolerated treatment well;Patient limited by fatigue   ? Behavior During Therapy [facility] for tasks assessed/performed   ? ?  ?  ? ?  ? ? ?Past Medical History:  ?Diagnosis Date  ? Abdominal pain, right upper quadrant   ? Back pain   ? Calculus of kidney 12/09/2013  ? Chronic back pain   ? unspecified  ? Chronic left shoulder pain 07/19/2015  ? Complication of anesthesia   ? Functional disorder of bladder   ? other  ? Galactorrhea 11/26/2014  ? Chronic   ? Hereditary and idiopathic neuropathy 08/19/2013  ? HPV test positive   ? Hypercholesteremia 08/19/2013  ? Hypertension   ? Incomplete bladder emptying   ? Microscopic hematuria   ? MS (multiple sclerosis) ([place])   ? Muscle spasticity 05/21/2014  ? Nonspecific findings on examination of urine   ? other  ? Osteopenia   ? PONV (postoperative nausea and vomiting)   ? Status post laparoscopic supracervical hysterectomy 11/26/2014  ? Tobacco user 11/26/2014  ? Wrist fracture   ? ? ?Past Surgical History:  ?Procedure Laterality Date  ? bilateral tubal ligation  1996  ? BREAST CYST EXCISION Left 2002  ? FRACTURE SURGERY    ? KNEE SURGERY    ? right  ? LAPAROSCOPIC SUPRACERVICAL HYSTERECTOMY  08/05/2013  ? ORIF WRIST FRACTURE Left 01/17/2017  ? Procedure: OPEN REDUCTION INTERNAL FIXATION (ORIF) WRIST FRACTURE;  Surgeon:  Lovell Sheehan, MD;  Location: [facility];  Service: Orthopedics;  Laterality: Left;  ? RADIOLOGY WITH ANESTHESIA N/A 03/18/2020  ? Procedure: MRI WITH ANESTHESIA CERVICAL SPINE AND BRAIN  WITH AND WITHOUT CONTRAST;  Surgeon: Radiologist, Medication, MD;  Location: [place];  Service: Radiology;  Laterality: N/A;  ? TUBAL LIGATION Bilateral   ? VAGINAL HYSTERECTOMY  03/2006  ? ? ?There were no vitals filed for this visit. ? ? Subjective Assessment - 08/25/21 1020   ? ? Subjective Patient reports no falls or LOB since last session. No spasms today.   ? Pertinent History Patient is a pleasant 61 year old female who is returning to PT after MS exacerbation and hospitalization for COVID. Patient went to SNF for one month and then received home health PT for a month (8 visits).  PMH includes HTN, SVT, neurogenic bowel, MS (1995), neuropathy, hx of wrist fx, HPV, Hypercholesteremia, adiposity, osteopenia, spasticity. She wears bilateral AFOs and uses a RW and/or wheelchair for mobility. She drives an adapted car.   ? Limitations Lifting;Standing;Walking;House hold activities;Sitting   ? How long can you sit comfortably? n/a   ? How long can you stand comfortably? 2 minutes   ? How long can you walk comfortably? 40 ft in house   ? Currently in Pain? No/denies   ? ?  ?  ? ?  ? ? ? ? ? ? ? ? ?  ?  standing in // bars: ?-standing weight shifts without UE support ?-lateral step 10x each LE  ?-PVC chest press 10x ?-PVC overhead raise 10x ?-sit to stand 5x with rest breaks.  ?  ? ?Seated: ?GTB:  ?-overhead Y raise 10x ?-row 10x  ?-external rotation 10x ?-cross body D1 10x each side  ?-GTB abduction 10xeach LE ? ? ?  ?Ice pack donned throughout session to minimize exacerbation of MS symptoms in order to perform interventions ?  ?  ?Pt educated throughout session about proper posture and technique with exercises. Improved exercise technique, movement at target joints, use of target muscles after min to mod verbal, visual, tactile  cues. ? ? ? ?Patient presents with excellent motivation throughout physical therapy session. Patient is able to side step with UE assistance this session. Patient is able to weight shift without UE assistance for first time this session. The pt will benefit from further skilled PT to improve mobility, strength, pain, gait and balance in order to decrease fall risk and increase QOL ? ? ? ? ? ? ? ? ? ? ? ? ? ? ? ? PT Education - 08/25/21 0958   ? ? Education provided Yes   ? Education Details exercise technique, body mechanics   ? Person(s) Educated Patient   ? Methods Explanation;Demonstration;Tactile cues;Verbal cues   ? Comprehension Verbalized understanding;Returned demonstration;Verbal cues required;Tactile cues required   ? ?  ?  ? ?  ? ? ? PT Short Term Goals - 05/05/21 1253   ? ?  ? PT SHORT TERM GOAL #1  ? Title Patient will be independent in home exercise program to improve strength/mobility for better functional independence with ADLs.   ? Baseline 10/4 HEP provided 11/8: HEP compliant   ? Time 4   ? Period Weeks   ? Status Achieved   ? Target Date 03/08/21   ? ?  ?  ? ?  ? ? ? ? PT Long Term Goals - 07/26/21 0001   ? ?  ? PT LONG TERM GOAL #1  ? Title Patient will increase FOTO score to equal to or greater than  51/100   to demonstrate statistically significant improvement in mobility and quality of life.   ? Baseline 10/4: 42% 11/8: 36% 12/29: 45%; 06/09/2021= 36% 39: 36%   ? Time 12   ? Period Weeks   ? Status On-going   ? Target Date 10/18/21   ?  ? PT LONG TERM GOAL #2  ? Title Patient (< 64 years old) will complete five times sit to stand test in < 20 seconds indicating an increased LE strength and improved balance.   ? Baseline 10/4: 33.15 seconds with heavy BUE support 11/8: 30 seconds; 12/29: deferred to future session. Pt attempted today but unable to complete d/t increase in R side LBP; 06/09/2021= 27.72 sec with UE support 3/9: 37.74 seconds 3/21: 29 seconds with BUE support   ? Time 12   ? Period  Weeks   ? Status Partially Met   ? Target Date 10/18/21   ?  ? PT LONG TERM GOAL #3  ? Title Patient will increase 10 meter walk test to <20 seconds as to improve gait speed for better community ambulation and to reduce fall risk.   ? Baseline 10/4: 43.17 seconds with RW 11/8: 40.8 seconds; 43 sec (0.23 m/s) with RW; 06/09/2021= 40.7 sec with RW 3/9: 38.8 seconds   ? Time 12   ? Period Weeks   ? Status  Partially Met   ? Target Date 10/18/21   ?  ? PT LONG TERM GOAL #4  ? Title Patient will reduce timed up and go to <11 seconds to reduce fall risk and demonstrate improved transfer/gait ability.   ? Baseline 10/4: 52.66 seconds with RW 11/8: 49 seconds with RW; 12/29: 44.3 sec; 06/09/2021= 46.45 sec with use of RW 3/9: 39.9   ? Time 12   ? Period Weeks   ? Status Partially Met   ? Target Date 10/18/21   ?  ? PT LONG TERM GOAL #5  ? Title Patient will transfer from her shower seat independently for safety with ADLs.   ? Baseline 3/21: unable   ? Time 12   ? Period Weeks   ? Status New   ? Target Date 10/18/21   ?  ? PT LONG TERM GOAL #6  ? Title Patient will get into bed without assistance to increase independence with functional mobility.   ? Baseline 3/21: needs assistance from sister   ? Time 12   ? Period Weeks   ? Status New   ? Target Date 10/18/21   ? ?  ?  ? ?  ? ? ? ? ? ? ? ? Plan - 08/25/21 1230   ? ? Clinical Impression Statement Patient presents with excellent motivation throughout physical therapy session. Patient is able to side step with UE assistance this session. Patient is able to weight shift without UE assistance for first time this session. The pt will benefit from further skilled PT to improve mobility, strength, pain, gait and balance in order to decrease fall risk and increase QOL   ? Personal Factors and Comorbidities Age;Comorbidity 3+;Finances;Fitness;Past/Current Experience;Sex;Social Background;Time since onset of injury/illness/exacerbation;Transportation   ? Comorbidities HTN, SVT, neurogenic  bowel, MS (1995), neuropathy, hx of wrist fx, HPV, Hypercholesteremia, adiposity, osteopenia, spasticity.   ? Examination-Activity Limitations Bathing;Bed Mobility;Bend;Carry;Continence;Dressing;Hygiene/Grooming

## 2021-08-30 ENCOUNTER — Ambulatory Visit: Payer: PPO

## 2021-08-30 DIAGNOSIS — R2689 Other abnormalities of gait and mobility: Secondary | ICD-10-CM

## 2021-08-30 DIAGNOSIS — R2681 Unsteadiness on feet: Secondary | ICD-10-CM

## 2021-08-30 DIAGNOSIS — M6281 Muscle weakness (generalized): Secondary | ICD-10-CM

## 2021-08-30 DIAGNOSIS — R262 Difficulty in walking, not elsewhere classified: Secondary | ICD-10-CM

## 2021-08-30 NOTE — Therapy (Signed)
[place] ?[facility] ?[place], [place], [address] ?Phone: [phone]   Fax:  [phone] ? ?Physical Therapy Treatment ? ?Patient Details  ?Name: Lisa Williams ?MRN: [id number] ?Date of Birth: Jul 21, 1960 ?Referring Provider (PT): Margette Fast MD ? ? ?Encounter Date: 08/30/2021 ? ? PT End of Session - 08/30/21 1314   ? ? Visit Number [id number]   ? Number of Visits 67   ? Date for PT Re-Evaluation 10/18/21   ? Authorization Type 3/10 PN 08/18/21   ? PT Start Time 1015   ? PT Stop Time 1059   ? PT Time Calculation (min) 44 min   ? Equipment Utilized During Treatment Gait belt   ? Activity Tolerance Patient tolerated treatment well;Patient limited by fatigue   ? Behavior During Therapy [facility] for tasks assessed/performed   ? ?  ?  ? ?  ? ? ?Past Medical History:  ?Diagnosis Date  ? Abdominal pain, right upper quadrant   ? Back pain   ? Calculus of kidney 12/09/2013  ? Chronic back pain   ? unspecified  ? Chronic left shoulder pain 07/19/2015  ? Complication of anesthesia   ? Functional disorder of bladder   ? other  ? Galactorrhea 11/26/2014  ? Chronic   ? Hereditary and idiopathic neuropathy 08/19/2013  ? HPV test positive   ? Hypercholesteremia 08/19/2013  ? Hypertension   ? Incomplete bladder emptying   ? Microscopic hematuria   ? MS (multiple sclerosis) ([place])   ? Muscle spasticity 05/21/2014  ? Nonspecific findings on examination of urine   ? other  ? Osteopenia   ? PONV (postoperative nausea and vomiting)   ? Status post laparoscopic supracervical hysterectomy 11/26/2014  ? Tobacco user 11/26/2014  ? Wrist fracture   ? ? ?Past Surgical History:  ?Procedure Laterality Date  ? bilateral tubal ligation  1996  ? BREAST CYST EXCISION Left 2002  ? FRACTURE SURGERY    ? KNEE SURGERY    ? right  ? LAPAROSCOPIC SUPRACERVICAL HYSTERECTOMY  08/05/2013  ? ORIF WRIST FRACTURE Left 01/17/2017  ? Procedure: OPEN REDUCTION INTERNAL FIXATION (ORIF) WRIST FRACTURE;  Surgeon:  Lovell Sheehan, MD;  Location: [facility];  Service: Orthopedics;  Laterality: Left;  ? RADIOLOGY WITH ANESTHESIA N/A 03/18/2020  ? Procedure: MRI WITH ANESTHESIA CERVICAL SPINE AND BRAIN  WITH AND WITHOUT CONTRAST;  Surgeon: Radiologist, Medication, MD;  Location: [place];  Service: Radiology;  Laterality: N/A;  ? TUBAL LIGATION Bilateral   ? VAGINAL HYSTERECTOMY  03/2006  ? ? ?There were no vitals filed for this visit. ? ? Subjective Assessment - 08/30/21 1314   ? ? Subjective Patient reports her legs are feeling good. No falls or LOB since last session.   ? Pertinent History Patient is a pleasant 61 year old female who is returning to PT after MS exacerbation and hospitalization for COVID. Patient went to SNF for one month and then received home health PT for a month (8 visits).  PMH includes HTN, SVT, neurogenic bowel, MS (1995), neuropathy, hx of wrist fx, HPV, Hypercholesteremia, adiposity, osteopenia, spasticity. She wears bilateral AFOs and uses a RW and/or wheelchair for mobility. She drives an adapted car.   ? Limitations Lifting;Standing;Walking;House hold activities;Sitting   ? How long can you sit comfortably? n/a   ? How long can you stand comfortably? 2 minutes   ? How long can you walk comfortably? 40 ft in house   ? Currently in Pain?  No/denies   ? ?  ?  ? ?  ? ? ? ? ? ? ? ? ? ?Standing: ?Sit to stand technique on incline/decline:  ?-sit to stand facing forward on incline x2 trials with second trial stand and pivot to the L for carryover to car transfer ?-sit to stand facing L on incline; unable to sit back down with RUE; must use LUE: x2 trials ?-sit to stand facing R on incline improved ability with cueing for core activation and gluteal activation 2x ? ?Seated: ?Seated boxing: cues for sequencing, body mechanics, and pace/rhythm for functional contraction and timing of muscle recruitment: ?-Cross body punches to mitts on PT hands with no back support for focused core stabilization with crossing  midline 2x 60 seconds  ?-Cross body punch with secondary combination of elbow for full cross body rotation to PT mitt for trunk stability, coordination, and cardiovascular challenge x 60 seconds ?-Upper cut to mitts in PT hands for core activation without back support with perturbations 60 seconds ?GTB overhead raise 10x ?GTB ER 10x ? ?Ice pack donned throughout session to minimize exacerbation of MS symptoms in order to perform interventions ?  ?Pt educated throughout session about proper posture and technique with exercises. Improved exercise technique, movement at target joints, use of target muscles after min to mod verbal, visual, tactile cues. ? ? ?Patient introduced to and performed sit to stand transfers on inclines/declines and varying angles with improved stability with repetition. Patient is highly motivated to progress this aspect of mobility for car transfers when out in the community. The pt will benefit from further skilled PT to improve mobility, strength, pain, gait and balance in order to decrease fall risk and increase QOL ? ? ? ? ? ? ? ? ? ? ? ? ? ? ? ? ? PT Education - 08/30/21 1314   ? ? Education provided Yes   ? Education Details exercise technique, body mechanics, STS on incline/decline   ? Person(s) Educated Patient   ? Methods Explanation;Demonstration;Tactile cues;Verbal cues   ? Comprehension Verbalized understanding;Returned demonstration;Verbal cues required;Tactile cues required   ? ?  ?  ? ?  ? ? ? PT Short Term Goals - 05/05/21 1253   ? ?  ? PT SHORT TERM GOAL #1  ? Title Patient will be independent in home exercise program to improve strength/mobility for better functional independence with ADLs.   ? Baseline 10/4 HEP provided 11/8: HEP compliant   ? Time 4   ? Period Weeks   ? Status Achieved   ? Target Date 03/08/21   ? ?  ?  ? ?  ? ? ? ? PT Long Term Goals - 07/26/21 0001   ? ?  ? PT LONG TERM GOAL #1  ? Title Patient will increase FOTO score to equal to or greater than  51/100    to demonstrate statistically significant improvement in mobility and quality of life.   ? Baseline 10/4: 42% 11/8: 36% 12/29: 45%; 06/09/2021= 36% 39: 36%   ? Time 12   ? Period Weeks   ? Status On-going   ? Target Date 10/18/21   ?  ? PT LONG TERM GOAL #2  ? Title Patient (< 13 years old) will complete five times sit to stand test in < 20 seconds indicating an increased LE strength and improved balance.   ? Baseline 10/4: 33.15 seconds with heavy BUE support 11/8: 30 seconds; 12/29: deferred to future session. Pt attempted today but unable  to complete d/t increase in R side LBP; 06/09/2021= 27.72 sec with UE support 3/9: 37.74 seconds 3/21: 29 seconds with BUE support   ? Time 12   ? Period Weeks   ? Status Partially Met   ? Target Date 10/18/21   ?  ? PT LONG TERM GOAL #3  ? Title Patient will increase 10 meter walk test to <20 seconds as to improve gait speed for better community ambulation and to reduce fall risk.   ? Baseline 10/4: 43.17 seconds with RW 11/8: 40.8 seconds; 43 sec (0.23 m/s) with RW; 06/09/2021= 40.7 sec with RW 3/9: 38.8 seconds   ? Time 12   ? Period Weeks   ? Status Partially Met   ? Target Date 10/18/21   ?  ? PT LONG TERM GOAL #4  ? Title Patient will reduce timed up and go to <11 seconds to reduce fall risk and demonstrate improved transfer/gait ability.   ? Baseline 10/4: 52.66 seconds with RW 11/8: 49 seconds with RW; 12/29: 44.3 sec; 06/09/2021= 46.45 sec with use of RW 3/9: 39.9   ? Time 12   ? Period Weeks   ? Status Partially Met   ? Target Date 10/18/21   ?  ? PT LONG TERM GOAL #5  ? Title Patient will transfer from her shower seat independently for safety with ADLs.   ? Baseline 3/21: unable   ? Time 12   ? Period Weeks   ? Status New   ? Target Date 10/18/21   ?  ? PT LONG TERM GOAL #6  ? Title Patient will get into bed without assistance to increase independence with functional mobility.   ? Baseline 3/21: needs assistance from sister   ? Time 12   ? Period Weeks   ? Status New   ?  Target Date 10/18/21   ? ?  ?  ? ?  ? ? ? ? ? ? ? ? Plan - 08/30/21 1319   ? ? Clinical Impression Statement Patient introduced to and performed sit to stand transfers on inclines/declines and varying angles with

## 2021-09-01 ENCOUNTER — Ambulatory Visit: Payer: PPO

## 2021-09-01 DIAGNOSIS — R2689 Other abnormalities of gait and mobility: Secondary | ICD-10-CM | POA: Diagnosis not present

## 2021-09-01 DIAGNOSIS — R262 Difficulty in walking, not elsewhere classified: Secondary | ICD-10-CM

## 2021-09-01 DIAGNOSIS — R2681 Unsteadiness on feet: Secondary | ICD-10-CM

## 2021-09-01 DIAGNOSIS — M6281 Muscle weakness (generalized): Secondary | ICD-10-CM

## 2021-09-01 NOTE — Therapy (Signed)
?[facility] ?[place], [place], [address] ?Phone: [phone]   Fax:  [phone] ? ?Physical Therapy Treatment ? ?Patient Details  ?Name: Lisa Williams ?MRN: [id number] ?Date of Birth: April 25, 1961 ?Referring Provider (PT): Margette Fast MD ? ? ?Encounter Date: 09/01/2021 ? ? PT End of Session - 09/01/21 1000   ? ? Visit Number [id number]   ? Number of Visits 67   ? Date for PT Re-Evaluation 10/18/21   ? Authorization Type 4/10 PN 08/18/21   ? PT Start Time 1015   ? PT Stop Time 1059   ? PT Time Calculation (min) 44 min   ? Equipment Utilized During Treatment Gait belt   ? Activity Tolerance Patient tolerated treatment well;Patient limited by fatigue   ? Behavior During Therapy [facility] for tasks assessed/performed   ? ?  ?  ? ?  ? ? ?Past Medical History:  ?Diagnosis Date  ? Abdominal pain, right upper quadrant   ? Back pain   ? Calculus of kidney 12/09/2013  ? Chronic back pain   ? unspecified  ? Chronic left shoulder pain 07/19/2015  ? Complication of anesthesia   ? Functional disorder of bladder   ? other  ? Galactorrhea 11/26/2014  ? Chronic   ? Hereditary and idiopathic neuropathy 08/19/2013  ? HPV test positive   ? Hypercholesteremia 08/19/2013  ? Hypertension   ? Incomplete bladder emptying   ? Microscopic hematuria   ? MS (multiple sclerosis) ([place])   ? Muscle spasticity 05/21/2014  ? Nonspecific findings on examination of urine   ? other  ? Osteopenia   ? PONV (postoperative nausea and vomiting)   ? Status post laparoscopic supracervical hysterectomy 11/26/2014  ? Tobacco user 11/26/2014  ? Wrist fracture   ? ? ?Past Surgical History:  ?Procedure Laterality Date  ? bilateral tubal ligation  1996  ? BREAST CYST EXCISION Left 2002  ? FRACTURE SURGERY    ? KNEE SURGERY    ? right  ? LAPAROSCOPIC SUPRACERVICAL HYSTERECTOMY  08/05/2013  ? ORIF WRIST FRACTURE Left 01/17/2017  ? Procedure: OPEN REDUCTION INTERNAL FIXATION (ORIF) WRIST FRACTURE;  Surgeon:  Lovell Sheehan, MD;  Location: [facility];  Service: Orthopedics;  Laterality: Left;  ? RADIOLOGY WITH ANESTHESIA N/A 03/18/2020  ? Procedure: MRI WITH ANESTHESIA CERVICAL SPINE AND BRAIN  WITH AND WITHOUT CONTRAST;  Surgeon: Radiologist, Medication, MD;  Location: [place];  Service: Radiology;  Laterality: N/A;  ? TUBAL LIGATION Bilateral   ? VAGINAL HYSTERECTOMY  03/2006  ? ? ?There were no vitals filed for this visit. ? ? Subjective Assessment - 09/01/21 1203   ? ? Subjective Patient reports she is feeling well. Has been compliant with HEP.   ? Pertinent History Patient is a pleasant 61 year old female who is returning to PT after MS exacerbation and hospitalization for COVID. Patient went to SNF for one month and then received home health PT for a month (8 visits).  PMH includes HTN, SVT, neurogenic bowel, MS (1995), neuropathy, hx of wrist fx, HPV, Hypercholesteremia, adiposity, osteopenia, spasticity. She wears bilateral AFOs and uses a RW and/or wheelchair for mobility. She drives an adapted car.   ? Limitations Lifting;Standing;Walking;House hold activities;Sitting   ? How long can you sit comfortably? n/a   ? How long can you stand comfortably? 2 minutes   ? How long can you walk comfortably? 40 ft in house   ? Currently in Pain? No/denies   ? ?  ?  ? ?  ? ? ? ? ? ?  ?  ?  standing ?Ambulate 157 ft with bRW and wheelchair follow. Two rest breas required. Improved foot clearance initially but did decrease as patient fatigued and increased reliance upon [employer].   ?  ?Seated: ?GTB: ?-overhead Y raise 10x ?-ER 10x ?-abduction 10x ?-row 10x ? ?  ?  ?Ice pack donned throughout session to minimize exacerbation of MS symptoms in order to perform interventions ?  ?  ?  ?Pt educated throughout session about proper posture and technique with exercises. Improved exercise technique, movement at target joints, use of target muscles after min to mod verbal, visual, tactile cue ?  ?  ?Patient presents with excellent motivation.  She is able to ambulate increased duration ambulation of 157 ft this session with two rest breaks. Post ambulation patient is fatigued but able to perform banded strengthening interventions. The pt will benefit from further skilled PT to improve mobility, strength, pain, gait and balance in order to decrease fall risk and increase QOL ? ? ? ? ? ? ? ? ? ? ? ? ? ? ? ? ? ? ? ? ? ? PT Education - 09/01/21 1000   ? ? Education provided Yes   ? Education Details exercise technique, body mechanics   ? Person(s) Educated Patient   ? Methods Explanation;Demonstration;Tactile cues;Verbal cues   ? Comprehension Verbalized understanding;Returned demonstration;Verbal cues required;Tactile cues required   ? ?  ?  ? ?  ? ? ? PT Short Term Goals - 05/05/21 1253   ? ?  ? PT SHORT TERM GOAL #1  ? Title Patient will be independent in home exercise program to improve strength/mobility for better functional independence with ADLs.   ? Baseline 10/4 HEP provided 11/8: HEP compliant   ? Time 4   ? Period Weeks   ? Status Achieved   ? Target Date 03/08/21   ? ?  ?  ? ?  ? ? ? ? PT Long Term Goals - 07/26/21 0001   ? ?  ? PT LONG TERM GOAL #1  ? Title Patient will increase FOTO score to equal to or greater than  51/100   to demonstrate statistically significant improvement in mobility and quality of life.   ? Baseline 10/4: 42% 11/8: 36% 12/29: 45%; 06/09/2021= 36% 39: 36%   ? Time 12   ? Period Weeks   ? Status On-going   ? Target Date 10/18/21   ?  ? PT LONG TERM GOAL #2  ? Title Patient (< 62 years old) will complete five times sit to stand test in < 20 seconds indicating an increased LE strength and improved balance.   ? Baseline 10/4: 33.15 seconds with heavy BUE support 11/8: 30 seconds; 12/29: deferred to future session. Pt attempted today but unable to complete d/t increase in R side LBP; 06/09/2021= 27.72 sec with UE support 3/9: 37.74 seconds 3/21: 29 seconds with BUE support   ? Time 12   ? Period Weeks   ? Status Partially Met   ?  Target Date 10/18/21   ?  ? PT LONG TERM GOAL #3  ? Title Patient will increase 10 meter walk test to <20 seconds as to improve gait speed for better community ambulation and to reduce fall risk.   ? Baseline 10/4: 43.17 seconds with RW 11/8: 40.8 seconds; 43 sec (0.23 m/s) with RW; 06/09/2021= 40.7 sec with RW 3/9: 38.8 seconds   ? Time 12   ? Period Weeks   ? Status Partially Met   ?  Target Date 10/18/21   ?  ? PT LONG TERM GOAL #4  ? Title Patient will reduce timed up and go to <11 seconds to reduce fall risk and demonstrate improved transfer/gait ability.   ? Baseline 10/4: 52.66 seconds with RW 11/8: 49 seconds with RW; 12/29: 44.3 sec; 06/09/2021= 46.45 sec with use of RW 3/9: 39.9   ? Time 12   ? Period Weeks   ? Status Partially Met   ? Target Date 10/18/21   ?  ? PT LONG TERM GOAL #5  ? Title Patient will transfer from her shower seat independently for safety with ADLs.   ? Baseline 3/21: unable   ? Time 12   ? Period Weeks   ? Status New   ? Target Date 10/18/21   ?  ? PT LONG TERM GOAL #6  ? Title Patient will get into bed without assistance to increase independence with functional mobility.   ? Baseline 3/21: needs assistance from sister   ? Time 12   ? Period Weeks   ? Status New   ? Target Date 10/18/21   ? ?  ?  ? ?  ? ? ? ? ? ? ? ? Plan - 09/01/21 1211   ? ? Clinical Impression Statement Patient presents with excellent motivation. She is able to ambulate increased duration ambulation of 157 ft this session with two rest breaks. Post ambulation patient is fatigued but able to perform banded strengthening interventions. The pt will benefit from further skilled PT to improve mobility, strength, pain, gait and balance in order to decrease fall risk and increase QOL   ? Personal Factors and Comorbidities Age;Comorbidity 3+;Finances;Fitness;Past/Current Experience;Sex;Social Background;Time since onset of injury/illness/exacerbation;Transportation   ? Comorbidities HTN, SVT, neurogenic bowel, MS (1995),  neuropathy, hx of wrist fx, HPV, Hypercholesteremia, adiposity, osteopenia, spasticity.   ? Examination-Activity Limitations Bathing;Bed Mobility;Bend;Carry;Continence;Dressing;Hygiene/Grooming;Stairs;Squat;Reach O

## 2021-09-06 ENCOUNTER — Ambulatory Visit: Payer: PPO | Attending: Neurology

## 2021-09-06 DIAGNOSIS — R269 Unspecified abnormalities of gait and mobility: Secondary | ICD-10-CM | POA: Diagnosis not present

## 2021-09-06 DIAGNOSIS — R2681 Unsteadiness on feet: Secondary | ICD-10-CM | POA: Diagnosis not present

## 2021-09-06 DIAGNOSIS — R278 Other lack of coordination: Secondary | ICD-10-CM | POA: Diagnosis not present

## 2021-09-06 DIAGNOSIS — R262 Difficulty in walking, not elsewhere classified: Secondary | ICD-10-CM | POA: Insufficient documentation

## 2021-09-06 DIAGNOSIS — G35 Multiple sclerosis: Secondary | ICD-10-CM | POA: Diagnosis not present

## 2021-09-06 DIAGNOSIS — R2689 Other abnormalities of gait and mobility: Secondary | ICD-10-CM | POA: Insufficient documentation

## 2021-09-06 DIAGNOSIS — M6281 Muscle weakness (generalized): Secondary | ICD-10-CM | POA: Insufficient documentation

## 2021-09-06 NOTE — Therapy (Signed)
[facility] ?[place], [place], [address] ?Phone: [phone]   Fax:  [phone] ? ?Physical Therapy Treatment ? ?Patient Details  ?Name: Lisa Williams ?MRN: [id number] ?Date of Birth: 12-Mar-1961 ?Referring Provider (PT): Margette Fast MD ? ? ?Encounter Date: 09/06/2021 ? ? PT End of Session - 09/06/21 0954   ? ? Visit Number [id number]   ? Number of Visits 67   ? Date for PT Re-Evaluation 10/18/21   ? Authorization Type 5/10 PN 08/18/21   ? PT Start Time 1015   ? PT Stop Time 1059   ? PT Time Calculation (min) 44 min   ? Equipment Utilized During Treatment Gait belt   ? Activity Tolerance Patient tolerated treatment well;Patient limited by fatigue   ? Behavior During Therapy [facility] for tasks assessed/performed   ? ?  ?  ? ?  ? ? ?Past Medical History:  ?Diagnosis Date  ? Abdominal pain, right upper quadrant   ? Back pain   ? Calculus of kidney 12/09/2013  ? Chronic back pain   ? unspecified  ? Chronic left shoulder pain 07/19/2015  ? Complication of anesthesia   ? Functional disorder of bladder   ? other  ? Galactorrhea 11/26/2014  ? Chronic   ? Hereditary and idiopathic neuropathy 08/19/2013  ? HPV test positive   ? Hypercholesteremia 08/19/2013  ? Hypertension   ? Incomplete bladder emptying   ? Microscopic hematuria   ? MS (multiple sclerosis) ([place])   ? Muscle spasticity 05/21/2014  ? Nonspecific findings on examination of urine   ? other  ? Osteopenia   ? PONV (postoperative nausea and vomiting)   ? Status post laparoscopic supracervical hysterectomy 11/26/2014  ? Tobacco user 11/26/2014  ? Wrist fracture   ? ? ?Past Surgical History:  ?Procedure Laterality Date  ? bilateral tubal ligation  1996  ? BREAST CYST EXCISION Left 2002  ? FRACTURE SURGERY    ? KNEE SURGERY    ? right  ? LAPAROSCOPIC SUPRACERVICAL HYSTERECTOMY  08/05/2013  ? ORIF WRIST FRACTURE Left 01/17/2017  ? Procedure: OPEN REDUCTION INTERNAL FIXATION (ORIF) WRIST FRACTURE;  Surgeon:  Lovell Sheehan, MD;  Location: [facility];  Service: Orthopedics;  Laterality: Left;  ? RADIOLOGY WITH ANESTHESIA N/A 03/18/2020  ? Procedure: MRI WITH ANESTHESIA CERVICAL SPINE AND BRAIN  WITH AND WITHOUT CONTRAST;  Surgeon: Radiologist, Medication, MD;  Location: [place];  Service: Radiology;  Laterality: N/A;  ? TUBAL LIGATION Bilateral   ? VAGINAL HYSTERECTOMY  03/2006  ? ? ?There were no vitals filed for this visit. ? ? Subjective Assessment - 09/06/21 1219   ? ? Subjective Patient reports she is doing well,no falls or LOB since last session   ? Pertinent History Patient is a pleasant 61 year old female who is returning to PT after MS exacerbation and hospitalization for COVID. Patient went to SNF for one month and then received home health PT for a month (8 visits).  PMH includes HTN, SVT, neurogenic bowel, MS (1995), neuropathy, hx of wrist fx, HPV, Hypercholesteremia, adiposity, osteopenia, spasticity. She wears bilateral AFOs and uses a RW and/or wheelchair for mobility. She drives an adapted car.   ? Limitations Lifting;Standing;Walking;House hold activities;Sitting   ? How long can you sit comfortably? n/a   ? How long can you stand comfortably? 2 minutes   ? How long can you walk comfortably? 40 ft in house   ? Currently in Pain? No/denies   ? ?  ?  ? ?  ? ? ? ? ?  ?  ?  standing ?Ambulate 196 ft with bRW and wheelchair follow. Two rest breas required. Improved foot clearance initially but did decrease as patient fatigued and increased reliance upon [employer].  longest duration ambulation to date.  ?  ?Seated: ?GTB: ?-overhead Y raise 10x ?-D2 10x each side  ?-row 10x ?Shoulder rolls 10x ?Median nerve glide 10x LUE   ?  ?  ?Ice pack donned throughout session to minimize exacerbation of MS symptoms in order to perform interventions ?  ?  ?  ?Pt educated throughout session about proper posture and technique with exercises. Improved exercise technique, movement at target joints, use of target muscles after min to mod  verbal, visual, tactile cue ?  ? ? ?Patient ambulates longest duration to date during session today with a total of 196 ft with two rest breaks indicating improved mobility and capacity for functional mobility. She does have one episode of L shoulder pain that resolves with shoulder rolls and median nerve glide. The pt will benefit from further skilled PT to improve mobility, strength, pain, gait and balance in order to decrease fall risk and increase QOL ? ? ? ? ? ? ? ? ? ? ? ? ? ? ? ? ? ? ? ? ? PT Education - 09/06/21 0954   ? ? Education provided Yes   ? Education Details exercise technique, body mechanics   ? Person(s) Educated Patient   ? Methods Explanation;Demonstration;Tactile cues;Verbal cues   ? Comprehension Verbalized understanding;Returned demonstration;Verbal cues required;Tactile cues required   ? ?  ?  ? ?  ? ? ? PT Short Term Goals - 05/05/21 1253   ? ?  ? PT SHORT TERM GOAL #1  ? Title Patient will be independent in home exercise program to improve strength/mobility for better functional independence with ADLs.   ? Baseline 10/4 HEP provided 11/8: HEP compliant   ? Time 4   ? Period Weeks   ? Status Achieved   ? Target Date 03/08/21   ? ?  ?  ? ?  ? ? ? ? PT Long Term Goals - 07/26/21 0001   ? ?  ? PT LONG TERM GOAL #1  ? Title Patient will increase FOTO score to equal to or greater than  51/100   to demonstrate statistically significant improvement in mobility and quality of life.   ? Baseline 10/4: 42% 11/8: 36% 12/29: 45%; 06/09/2021= 36% 39: 36%   ? Time 12   ? Period Weeks   ? Status On-going   ? Target Date 10/18/21   ?  ? PT LONG TERM GOAL #2  ? Title Patient (< 66 years old) will complete five times sit to stand test in < 20 seconds indicating an increased LE strength and improved balance.   ? Baseline 10/4: 33.15 seconds with heavy BUE support 11/8: 30 seconds; 12/29: deferred to future session. Pt attempted today but unable to complete d/t increase in R side LBP; 06/09/2021= 27.72 sec with UE  support 3/9: 37.74 seconds 3/21: 29 seconds with BUE support   ? Time 12   ? Period Weeks   ? Status Partially Met   ? Target Date 10/18/21   ?  ? PT LONG TERM GOAL #3  ? Title Patient will increase 10 meter walk test to <20 seconds as to improve gait speed for better community ambulation and to reduce fall risk.   ? Baseline 10/4: 43.17 seconds with RW 11/8: 40.8 seconds; 43 sec (0.23 m/s) with RW; 06/09/2021= 40.7  sec with RW 3/9: 38.8 seconds   ? Time 12   ? Period Weeks   ? Status Partially Met   ? Target Date 10/18/21   ?  ? PT LONG TERM GOAL #4  ? Title Patient will reduce timed up and go to <11 seconds to reduce fall risk and demonstrate improved transfer/gait ability.   ? Baseline 10/4: 52.66 seconds with RW 11/8: 49 seconds with RW; 12/29: 44.3 sec; 06/09/2021= 46.45 sec with use of RW 3/9: 39.9   ? Time 12   ? Period Weeks   ? Status Partially Met   ? Target Date 10/18/21   ?  ? PT LONG TERM GOAL #5  ? Title Patient will transfer from her shower seat independently for safety with ADLs.   ? Baseline 3/21: unable   ? Time 12   ? Period Weeks   ? Status New   ? Target Date 10/18/21   ?  ? PT LONG TERM GOAL #6  ? Title Patient will get into bed without assistance to increase independence with functional mobility.   ? Baseline 3/21: needs assistance from sister   ? Time 12   ? Period Weeks   ? Status New   ? Target Date 10/18/21   ? ?  ?  ? ?  ? ? ? ? ? ? ? ? Plan - 09/06/21 1220   ? ? Clinical Impression Statement Patient ambulates longest duration to date during session today with a total of 196 ft with two rest breaks indicating improved mobility and capacity for functional mobility. She does have one episode of L shoulder pain that resolves with shoulder rolls and median nerve glide. The pt will benefit from further skilled PT to improve mobility, strength, pain, gait and balance in order to decrease fall risk and increase QOL   ? Personal Factors and Comorbidities Age;Comorbidity  3+;Finances;Fitness;Past/Current Experience;Sex;Social Background;Time since onset of injury/illness/exacerbation;Transportation   ? Comorbidities HTN, SVT, neurogenic bowel, MS (1995), neuropathy, hx of wrist fx, HPV, Hypercholesterem

## 2021-09-08 ENCOUNTER — Ambulatory Visit: Payer: PPO

## 2021-09-08 DIAGNOSIS — R2689 Other abnormalities of gait and mobility: Secondary | ICD-10-CM

## 2021-09-08 DIAGNOSIS — M6281 Muscle weakness (generalized): Secondary | ICD-10-CM

## 2021-09-08 DIAGNOSIS — R2681 Unsteadiness on feet: Secondary | ICD-10-CM

## 2021-09-08 NOTE — Therapy (Signed)
[place] [facility] ?[place], [place], [address] ?Phone: [phone]   Fax:  [phone] ? ?Physical Therapy Treatment ? ?Patient Details  ?Name: Lisa Williams ?MRN: [id number] ?Date of Birth: 03/10/1961 ?Referring Provider (PT): Margette Fast MD ? ? ?Encounter Date: 09/08/2021 ? ? PT End of Session - 09/08/21 1001   ? ? Visit Number [id number]   ? Number of Visits 67   ? Date for PT Re-Evaluation 10/18/21   ? Authorization Type 6/10 PN 08/18/21   ? PT Start Time 1015   ? PT Stop Time 1059   ? PT Time Calculation (min) 44 min   ? Equipment Utilized During Treatment Gait belt   ? Activity Tolerance Patient tolerated treatment well;Patient limited by fatigue   ? Behavior During Therapy [facility] for tasks assessed/performed   ? ?  ?  ? ?  ? ? ?Past Medical History:  ?Diagnosis Date  ? Abdominal pain, right upper quadrant   ? Back pain   ? Calculus of kidney 12/09/2013  ? Chronic back pain   ? unspecified  ? Chronic left shoulder pain 07/19/2015  ? Complication of anesthesia   ? Functional disorder of bladder   ? other  ? Galactorrhea 11/26/2014  ? Chronic   ? Hereditary and idiopathic neuropathy 08/19/2013  ? HPV test positive   ? Hypercholesteremia 08/19/2013  ? Hypertension   ? Incomplete bladder emptying   ? Microscopic hematuria   ? MS (multiple sclerosis) ([place])   ? Muscle spasticity 05/21/2014  ? Nonspecific findings on examination of urine   ? other  ? Osteopenia   ? PONV (postoperative nausea and vomiting)   ? Status post laparoscopic supracervical hysterectomy 11/26/2014  ? Tobacco user 11/26/2014  ? Wrist fracture   ? ? ?Past Surgical History:  ?Procedure Laterality Date  ? bilateral tubal ligation  1996  ? BREAST CYST EXCISION Left 2002  ? FRACTURE SURGERY    ? KNEE SURGERY    ? right  ? LAPAROSCOPIC SUPRACERVICAL HYSTERECTOMY  08/05/2013  ? ORIF WRIST FRACTURE Left 01/17/2017  ? Procedure: OPEN REDUCTION INTERNAL FIXATION (ORIF) WRIST FRACTURE;  Surgeon:  Lovell Sheehan, MD;  Location: [facility];  Service: Orthopedics;  Laterality: Left;  ? RADIOLOGY WITH ANESTHESIA N/A 03/18/2020  ? Procedure: MRI WITH ANESTHESIA CERVICAL SPINE AND BRAIN  WITH AND WITHOUT CONTRAST;  Surgeon: Radiologist, Medication, MD;  Location: [facility];  Service: Radiology;  Laterality: N/A;  ? TUBAL LIGATION Bilateral   ? VAGINAL HYSTERECTOMY  03/2006  ? ? ?There were no vitals filed for this visit. ? ? Subjective Assessment - 09/08/21 1059   ? ? Subjective Patient reports a death in the family. No falls or LOB since last session   ? Pertinent History Patient is a pleasant 61 year old female who is returning to PT after MS exacerbation and hospitalization for COVID. Patient went to SNF for one month and then received home health PT for a month (8 visits).  PMH includes HTN, SVT, neurogenic bowel, MS (1995), neuropathy, hx of wrist fx, HPV, Hypercholesteremia, adiposity, osteopenia, spasticity. She wears bilateral AFOs and uses a RW and/or wheelchair for mobility. She drives an adapted car.   ? Limitations Lifting;Standing;Walking;House hold activities;Sitting   ? How long can you sit comfortably? n/a   ? How long can you stand comfortably? 2 minutes   ? How long can you walk comfortably? 40 ft in house   ? Currently in Pain?  No/denies   ? ?  ?  ? ?  ? ? ? ? ?  ?standing in // bars: ?-standing weight shifts 15x each side with focus on LE shift rather than pulling with Ue's  ?-modified tandem stance 15 seconds no UE support ?-PVC chest press 10x ?-PVC overhead raise 10x ?-PVC witches brew 10x each direction ?-mini squat; terminated due to fatigue.  ?  ?  ?  ?Seated: ?  ?Seated on dynadisc:  ? -static sit arms crossed 30 seconds ?-anterior posterior trunk lean 10x each direction ?-medial lateral trunk lean outside BOS 10x each LE ?-alternating UE raises 10x each UE ?-coordinated clapping with PT x 3 minutes ?  ?GTB adduction 10x each LE ?GTB abduction 10xeach LE ? ?  ?  ?Ice pack donned throughout  session to minimize exacerbation of MS symptoms in order to perform interventions ? ? ? ? ?Patient tolerated progressive stabilization and strengthening interventions well. Her core stabilization is improving with decrease instability on dynadisc this session. Standing did have to be terminated due to LE fatigue. The pt will benefit from further skilled PT to improve mobility, strength, pain, gait and balance in order to decrease fall risk and increase QOL ? ? ? ? ? ? ? ? ? ? ? ? ? ? ? ? ? ? ? PT Education - 09/08/21 1000   ? ? Education provided Yes   ? Education Details exercise technique, body mechanics   ? Person(s) Educated Patient   ? Methods Explanation;Demonstration;Tactile cues;Verbal cues   ? Comprehension Verbalized understanding;Returned demonstration;Verbal cues required;Tactile cues required   ? ?  ?  ? ?  ? ? ? PT Short Term Goals - 05/05/21 1253   ? ?  ? PT SHORT TERM GOAL #1  ? Title Patient will be independent in home exercise program to improve strength/mobility for better functional independence with ADLs.   ? Baseline 10/4 HEP provided 11/8: HEP compliant   ? Time 4   ? Period Weeks   ? Status Achieved   ? Target Date 03/08/21   ? ?  ?  ? ?  ? ? ? ? PT Long Term Goals - 07/26/21 0001   ? ?  ? PT LONG TERM GOAL #1  ? Title Patient will increase FOTO score to equal to or greater than  51/100   to demonstrate statistically significant improvement in mobility and quality of life.   ? Baseline 10/4: 42% 11/8: 36% 12/29: 45%; 06/09/2021= 36% 39: 36%   ? Time 12   ? Period Weeks   ? Status On-going   ? Target Date 10/18/21   ?  ? PT LONG TERM GOAL #2  ? Title Patient (< 24 years old) will complete five times sit to stand test in < 20 seconds indicating an increased LE strength and improved balance.   ? Baseline 10/4: 33.15 seconds with heavy BUE support 11/8: 30 seconds; 12/29: deferred to future session. Pt attempted today but unable to complete d/t increase in R side LBP; 06/09/2021= 27.72 sec with UE  support 3/9: 37.74 seconds 3/21: 29 seconds with BUE support   ? Time 12   ? Period Weeks   ? Status Partially Met   ? Target Date 10/18/21   ?  ? PT LONG TERM GOAL #3  ? Title Patient will increase 10 meter walk test to <20 seconds as to improve gait speed for better community ambulation and to reduce fall risk.   ? Baseline 10/4:  43.17 seconds with RW 11/8: 40.8 seconds; 43 sec (0.23 m/s) with RW; 06/09/2021= 40.7 sec with RW 3/9: 38.8 seconds   ? Time 12   ? Period Weeks   ? Status Partially Met   ? Target Date 10/18/21   ?  ? PT LONG TERM GOAL #4  ? Title Patient will reduce timed up and go to <11 seconds to reduce fall risk and demonstrate improved transfer/gait ability.   ? Baseline 10/4: 52.66 seconds with RW 11/8: 49 seconds with RW; 12/29: 44.3 sec; 06/09/2021= 46.45 sec with use of RW 3/9: 39.9   ? Time 12   ? Period Weeks   ? Status Partially Met   ? Target Date 10/18/21   ?  ? PT LONG TERM GOAL #5  ? Title Patient will transfer from her shower seat independently for safety with ADLs.   ? Baseline 3/21: unable   ? Time 12   ? Period Weeks   ? Status New   ? Target Date 10/18/21   ?  ? PT LONG TERM GOAL #6  ? Title Patient will get into bed without assistance to increase independence with functional mobility.   ? Baseline 3/21: needs assistance from sister   ? Time 12   ? Period Weeks   ? Status New   ? Target Date 10/18/21   ? ?  ?  ? ?  ? ? ? ? ? ? ? ? Plan - 09/08/21 1100   ? ? Clinical Impression Statement Patient tolerated progressive stabilization and strengthening interventions well. Her core stabilization is improving with decrease instability on dynadisc this session. Standing did have to be terminated due to LE fatigue. The pt will benefit from further skilled PT to improve mobility, strength, pain, gait and balance in order to decrease fall risk and increase QOL   ? Personal Factors and Comorbidities Age;Comorbidity 3+;Finances;Fitness;Past/Current Experience;Sex;Social Background;Time since onset  of injury/illness/exacerbation;Transportation   ? Comorbidities HTN, SVT, neurogenic bowel, MS (1995), neuropathy, hx of wrist fx, HPV, Hypercholesteremia, adiposity, osteopenia, spasticity.   ? Examination-Acti

## 2021-09-13 ENCOUNTER — Ambulatory Visit: Payer: PPO

## 2021-09-13 DIAGNOSIS — R2689 Other abnormalities of gait and mobility: Secondary | ICD-10-CM

## 2021-09-13 DIAGNOSIS — M6281 Muscle weakness (generalized): Secondary | ICD-10-CM

## 2021-09-13 DIAGNOSIS — R2681 Unsteadiness on feet: Secondary | ICD-10-CM

## 2021-09-13 DIAGNOSIS — R262 Difficulty in walking, not elsewhere classified: Secondary | ICD-10-CM

## 2021-09-13 NOTE — Therapy (Signed)
[place] [facility] ?[place], [place], [address] ?Phone: [phone]   Fax:  [phone] ? ?Physical Therapy Treatment ? ?Patient Details  ?Name: Lisa Williams ?MRN: [id number] ?Date of Birth: 1961-01-21 ?Referring Provider (PT): Margette Fast MD ? ? ?Encounter Date: 09/13/2021 ? ? PT End of Session - 09/13/21 1141   ? ? Visit Number 38   ? Number of Visits 67   ? Date for PT Re-Evaluation 10/18/21   ? Authorization Type 7/10 PN 08/18/21   ? PT Start Time 1015   ? PT Stop Time 1059   ? PT Time Calculation (min) 44 min   ? Equipment Utilized During Treatment Gait belt   ? Activity Tolerance Patient tolerated treatment well;Patient limited by fatigue   ? Behavior During Therapy [facility] for tasks assessed/performed   ? ?  ?  ? ?  ? ? ?Past Medical History:  ?Diagnosis Date  ? Abdominal pain, right upper quadrant   ? Back pain   ? Calculus of kidney 12/09/2013  ? Chronic back pain   ? unspecified  ? Chronic left shoulder pain 07/19/2015  ? Complication of anesthesia   ? Functional disorder of bladder   ? other  ? Galactorrhea 11/26/2014  ? Chronic   ? Hereditary and idiopathic neuropathy 08/19/2013  ? HPV test positive   ? Hypercholesteremia 08/19/2013  ? Hypertension   ? Incomplete bladder emptying   ? Microscopic hematuria   ? MS (multiple sclerosis) ([place])   ? Muscle spasticity 05/21/2014  ? Nonspecific findings on examination of urine   ? other  ? Osteopenia   ? PONV (postoperative nausea and vomiting)   ? Status post laparoscopic supracervical hysterectomy 11/26/2014  ? Tobacco user 11/26/2014  ? Wrist fracture   ? ? ?Past Surgical History:  ?Procedure Laterality Date  ? bilateral tubal ligation  1996  ? BREAST CYST EXCISION Left 2002  ? FRACTURE SURGERY    ? KNEE SURGERY    ? right  ? LAPAROSCOPIC SUPRACERVICAL HYSTERECTOMY  08/05/2013  ? ORIF WRIST FRACTURE Left 01/17/2017  ? Procedure: OPEN REDUCTION INTERNAL FIXATION (ORIF) WRIST FRACTURE;  Surgeon:  Lovell Sheehan, MD;  Location: [facility];  Service: Orthopedics;  Laterality: Left;  ? RADIOLOGY WITH ANESTHESIA N/A 03/18/2020  ? Procedure: MRI WITH ANESTHESIA CERVICAL SPINE AND BRAIN  WITH AND WITHOUT CONTRAST;  Surgeon: Radiologist, Medication, MD;  Location[place];  Service: Radiology;  Laterality: N/A;  ? TUBAL LIGATION Bilateral   ? VAGINAL HYSTERECTOMY  03/2006  ? ? ?There were no vitals filed for this visit. ? ? Subjective Assessment - 09/13/21 1139   ? ? Subjective Patient will only have one PT session this week due to having a conflict with another appointment later this week. No falls or LOB since last session.   ? Pertinent History Patient is a pleasant 61 year old female who is returning to PT after MS exacerbation and hospitalization for COVID. Patient went to SNF for one month and then received home health PT for a month (8 visits).  PMH includes HTN, SVT, neurogenic bowel, MS (1995), neuropathy, hx of wrist fx, HPV, Hypercholesteremia, adiposity, osteopenia, spasticity. She wears bilateral AFOs and uses a RW and/or wheelchair for mobility. She drives an adapted car.   ? Limitations Lifting;Standing;Walking;House hold activities;Sitting   ? How long can you sit comfortably? n/a   ? How long can you stand comfortably? 2 minutes   ? How long can  you walk comfortably? 40 ft in house   ? Currently in Pain? No/denies   ? ?  ?  ? ?  ? ? ? ? ? ? ?standing ?Ambulate 200 ft with bRW and wheelchair follow. Two rest breas required. Improved foot clearance initially but did decrease as patient fatigued and increased reliance upon Ues.  Longest duration ambulation to date.  ?  ? Seated on dynadisc:  ? -static sit arms crossed 30 seconds ?-anterior posterior trunk lean 10x each direction ?-medial lateral trunk lean outside BOS 10x each LE ?-alternating UE raises 10x each UE ?-coordinated clapping with PT x 3 minutes ?  ?Ice pack donned throughout session to minimize exacerbation of MS symptoms in order to perform  interventions ?  ?  ?  ?Pt educated throughout session about proper posture and technique with exercises. Improved exercise technique, movement at target joints, use of target muscles after min to mod verbal, visual, tactile cue ?  ? ? ? ? ? ?Patient ambulated longest duration to date of 200 ft with two rest breaks. Her capacity for functional ambulation is slowly improving and she is highly motivated to continue current progression. Patient's core stability additionally is improving with decreased instability seated on dynadisc. The pt will benefit from further skilled PT to improve mobility, strength, pain, gait and balance in order to decrease fall risk and increase QOL ? ? ? ? ? ? ? ? ? ? ? ? ? ? ? ? PT Education - 09/13/21 1140   ? ? Education provided Yes   ? Education Details exercise technique, body mechanics   ? Person(s) Educated Patient   ? Methods Explanation;Demonstration;Tactile cues;Verbal cues   ? Comprehension Verbalized understanding;Returned demonstration;Verbal cues required;Tactile cues required   ? ?  ?  ? ?  ? ? ? PT Short Term Goals - 05/05/21 1253   ? ?  ? PT SHORT TERM GOAL #1  ? Title Patient will be independent in home exercise program to improve strength/mobility for better functional independence with ADLs.   ? Baseline 10/4 HEP provided 11/8: HEP compliant   ? Time 4   ? Period Weeks   ? Status Achieved   ? Target Date 03/08/21   ? ?  ?  ? ?  ? ? ? ? PT Long Term Goals - 07/26/21 0001   ? ?  ? PT LONG TERM GOAL #1  ? Title Patient will increase FOTO score to equal to or greater than  51/100   to demonstrate statistically significant improvement in mobility and quality of life.   ? Baseline 10/4: 42% 11/8: 36% 12/29: 45%; 06/09/2021= 36% 39: 36%   ? Time 12   ? Period Weeks   ? Status On-going   ? Target Date 10/18/21   ?  ? PT LONG TERM GOAL #2  ? Title Patient (< 110 years old) will complete five times sit to stand test in < 20 seconds indicating an increased LE strength and improved  balance.   ? Baseline 10/4: 33.15 seconds with heavy BUE support 11/8: 30 seconds; 12/29: deferred to future session. Pt attempted today but unable to complete d/t increase in R side LBP; 06/09/2021= 27.72 sec with UE support 3/9: 37.74 seconds 3/21: 29 seconds with BUE support   ? Time 12   ? Period Weeks   ? Status Partially Met   ? Target Date 10/18/21   ?  ? PT LONG TERM GOAL #3  ? Title Patient will increase  10 meter walk test to <20 seconds as to improve gait speed for better community ambulation and to reduce fall risk.   ? Baseline 10/4: 43.17 seconds with RW 11/8: 40.8 seconds; 43 sec (0.23 m/s) with RW; 06/09/2021= 40.7 sec with RW 3/9: 38.8 seconds   ? Time 12   ? Period Weeks   ? Status Partially Met   ? Target Date 10/18/21   ?  ? PT LONG TERM GOAL #4  ? Title Patient will reduce timed up and go to <11 seconds to reduce fall risk and demonstrate improved transfer/gait ability.   ? Baseline 10/4: 52.66 seconds with RW 11/8: 49 seconds with RW; 12/29: 44.3 sec; 06/09/2021= 46.45 sec with use of RW 3/9: 39.9   ? Time 12   ? Period Weeks   ? Status Partially Met   ? Target Date 10/18/21   ?  ? PT LONG TERM GOAL #5  ? Title Patient will transfer from her shower seat independently for safety with ADLs.   ? Baseline 3/21: unable   ? Time 12   ? Period Weeks   ? Status New   ? Target Date 10/18/21   ?  ? PT LONG TERM GOAL #6  ? Title Patient will get into bed without assistance to increase independence with functional mobility.   ? Baseline 3/21: needs assistance from sister   ? Time 12   ? Period Weeks   ? Status New   ? Target Date 10/18/21   ? ?  ?  ? ?  ? ? ? ? ? ? ? ? Plan - 09/13/21 1146   ? ? Clinical Impression Statement Patient ambulated longest duration to date of 200 ft with two rest breaks. Her capacity for functional ambulation is slowly improving and she is highly motivated to continue current progression. Patient's core stability additionally is improving with decreased instability seated on dynadisc.  The pt will benefit from further skilled PT to improve mobility, strength, pain, gait and balance in order to decrease fall risk and increase QOL   ? Personal Factors and Comorbidities Age;Comorbidity 3+;[place]

## 2021-09-15 ENCOUNTER — Ambulatory Visit: Payer: PPO

## 2021-09-20 ENCOUNTER — Ambulatory Visit: Payer: PPO

## 2021-09-20 DIAGNOSIS — R2689 Other abnormalities of gait and mobility: Secondary | ICD-10-CM

## 2021-09-20 DIAGNOSIS — R278 Other lack of coordination: Secondary | ICD-10-CM

## 2021-09-20 DIAGNOSIS — R269 Unspecified abnormalities of gait and mobility: Secondary | ICD-10-CM

## 2021-09-20 DIAGNOSIS — G35 Multiple sclerosis: Secondary | ICD-10-CM

## 2021-09-20 DIAGNOSIS — R2681 Unsteadiness on feet: Secondary | ICD-10-CM

## 2021-09-20 DIAGNOSIS — M6281 Muscle weakness (generalized): Secondary | ICD-10-CM

## 2021-09-20 DIAGNOSIS — R262 Difficulty in walking, not elsewhere classified: Secondary | ICD-10-CM

## 2021-09-20 NOTE — Therapy (Signed)
[facility] ?[place], [place], [address] ?Phone: [phone]   Fax:  [phone] ? ?Physical Therapy Treatment ? ?Patient Details  ?Name: Lisa Williams ?MRN: [id number] ?Date of Birth: 02-Apr-1961 ?Referring Provider (PT): Margette Fast MD ? ? ?Encounter Date: 09/20/2021 ? ? PT End of Session - 09/20/21 1014   ? ? Visit Number 58   ? Number of Visits 67   ? Date for PT Re-Evaluation 10/18/21   ? Authorization Type 8/10 PN 08/18/21   ? PT Start Time 1015   ? PT Stop Time 1059   ? PT Time Calculation (min) 44 min   ? Equipment Utilized During Treatment Gait belt   ? Activity Tolerance Patient tolerated treatment well;Patient limited by fatigue   ? Behavior During Therapy [facility] for tasks assessed/performed   ? ?  ?  ? ?  ? ? ?Past Medical History:  ?Diagnosis Date  ? Abdominal pain, right upper quadrant   ? Back pain   ? Calculus of kidney 12/09/2013  ? Chronic back pain   ? unspecified  ? Chronic left shoulder pain 07/19/2015  ? Complication of anesthesia   ? Functional disorder of bladder   ? other  ? Galactorrhea 11/26/2014  ? Chronic   ? Hereditary and idiopathic neuropathy 08/19/2013  ? HPV test positive   ? Hypercholesteremia 08/19/2013  ? Hypertension   ? Incomplete bladder emptying   ? Microscopic hematuria   ? MS (multiple sclerosis) ([place])   ? Muscle spasticity 05/21/2014  ? Nonspecific findings on examination of urine   ? other  ? Osteopenia   ? PONV (postoperative nausea and vomiting)   ? Status post laparoscopic supracervical hysterectomy 11/26/2014  ? Tobacco user 11/26/2014  ? Wrist fracture   ? ? ?Past Surgical History:  ?Procedure Laterality Date  ? bilateral tubal ligation  1996  ? BREAST CYST EXCISION Left 2002  ? FRACTURE SURGERY    ? KNEE SURGERY    ? right  ? LAPAROSCOPIC SUPRACERVICAL HYSTERECTOMY  08/05/2013  ? ORIF WRIST FRACTURE Left 01/17/2017  ? Procedure: OPEN REDUCTION INTERNAL FIXATION (ORIF) WRIST FRACTURE;  Surgeon:  Lovell Sheehan, MD;  Location: [facility];  Service: Orthopedics;  Laterality: Left;  ? RADIOLOGY WITH ANESTHESIA N/A 03/18/2020  ? Procedure: MRI WITH ANESTHESIA CERVICAL SPINE AND BRAIN  WITH AND WITHOUT CONTRAST;  Surgeon: Radiologist, Medication, MD;  Location: [facility];  Service: Radiology;  Laterality: N/A;  ? TUBAL LIGATION Bilateral   ? VAGINAL HYSTERECTOMY  03/2006  ? ? ?There were no vitals filed for this visit. ? ? Subjective Assessment - 09/20/21 1013   ? ? Subjective Patient reports that she was doing well but over past few days -developed some muscle spasms and states okay for now but declined to walk for fear of making symptoms worse.   ? Pertinent History Patient is a pleasant 61 year old female who is returning to PT after MS exacerbation and hospitalization for COVID. Patient went to SNF for one month and then received home health PT for a month (8 visits).  PMH includes HTN, SVT, neurogenic bowel, MS (1995), neuropathy, hx of wrist fx, HPV, Hypercholesteremia, adiposity, osteopenia, spasticity. She wears bilateral AFOs and uses a RW and/or wheelchair for mobility. She drives an adapted car.   ? Limitations Lifting;Standing;Walking;House hold activities;Sitting   ? How long can you sit comfortably? n/a   ? How long can you stand comfortably? 2 minutes   ?  How long can you walk comfortably? 40 ft in house   ? ?  ?  ? ?  ? ? ? ? ? ? ?INTERVENTIONS: ? ?Therapeutic Exercises:  ?Seated postural strengthening: Using GTB and 2 sets of 12 reps ? ?Scap retraction ?Shoulder Ext ?Horizontal shoulder ABD ?Shoulder PNF D2-  ? ? Seated on dynadisc:  ? -static sit arms crossed 30 seconds x 2 sets ?-anterior/posterior trunk lean 10x each direction ?-lateral trunk lean outside BOS 10x each side ?-alternating UE raises 10x each UE ?- Trunk twist - Arms folded x 10 each side (patient reported as challenging) ? ? ?Standing:  ?Lumbar flex into ext (holding onto RW) x 10 reps without report of pain. ? ?Ice pack donned at  low back region  throughout session to minimize exacerbation of MS symptoms in order to perform interventions ? ? ? ? ? ? ? ? ? ? ? ? ? ? ? ? PT Education - 09/20/21 1013   ? ? Education provided Yes   ? Education Details Exercise technique   ? Person(s) Educated Patient   ? Methods Explanation;Demonstration;Tactile cues;Verbal cues   ? Comprehension Verbalized understanding;Verbal cues required;Tactile cues required;Returned demonstration;Need further instruction   ? ?  ?  ? ?  ? ? ? PT Short Term Goals - 05/05/21 1253   ? ?  ? PT SHORT TERM GOAL #1  ? Title Patient will be independent in home exercise program to improve strength/mobility for better functional independence with ADLs.   ? Baseline 10/4 HEP provided 11/8: HEP compliant   ? Time 4   ? Period Weeks   ? Status Achieved   ? Target Date 03/08/21   ? ?  ?  ? ?  ? ? ? ? PT Long Term Goals - 07/26/21 0001   ? ?  ? PT LONG TERM GOAL #1  ? Title Patient will increase FOTO score to equal to or greater than  51/100   to demonstrate statistically significant improvement in mobility and quality of life.   ? Baseline 10/4: 42% 11/8: 36% 12/29: 45%; 06/09/2021= 36% 39: 36%   ? Time 12   ? Period Weeks   ? Status On-going   ? Target Date 10/18/21   ?  ? PT LONG TERM GOAL #2  ? Title Patient (< 60 years old) will complete five times sit to stand test in < 20 seconds indicating an increased LE strength and improved balance.   ? Baseline 10/4: 33.15 seconds with heavy BUE support 11/8: 30 seconds; 12/29: deferred to future session. Pt attempted today but unable to complete d/t increase in R side LBP; 06/09/2021= 27.72 sec with UE support 3/9: 37.74 seconds 3/21: 29 seconds with BUE support   ? Time 12   ? Period Weeks   ? Status Partially Met   ? Target Date 10/18/21   ?  ? PT LONG TERM GOAL #3  ? Title Patient will increase 10 meter walk test to <20 seconds as to improve gait speed for better community ambulation and to reduce fall risk.   ? Baseline 10/4: 43.17 seconds  with RW 11/8: 40.8 seconds; 43 sec (0.23 m/s) with RW; 06/09/2021= 40.7 sec with RW 3/9: 38.8 seconds   ? Time 12   ? Period Weeks   ? Status Partially Met   ? Target Date 10/18/21   ?  ? PT LONG TERM GOAL #4  ? Title Patient will reduce timed up and go to <11 seconds  to reduce fall risk and demonstrate improved transfer/gait ability.   ? Baseline 10/4: 52.66 seconds with RW 11/8: 49 seconds with RW; 12/29: 44.3 sec; 06/09/2021= 46.45 sec with use of RW 3/9: 39.9   ? Time 12   ? Period Weeks   ? Status Partially Met   ? Target Date 10/18/21   ?  ? PT LONG TERM GOAL #5  ? Title Patient will transfer from her shower seat independently for safety with ADLs.   ? Baseline 3/21: unable   ? Time 12   ? Period Weeks   ? Status New   ? Target Date 10/18/21   ?  ? PT LONG TERM GOAL #6  ? Title Patient will get into bed without assistance to increase independence with functional mobility.   ? Baseline 3/21: needs assistance from sister   ? Time 12   ? Period Weeks   ? Status New   ? Target Date 10/18/21   ? ?  ?  ? ?  ? ? ? ? ? ? ? ? Plan - 09/20/21 1014   ? ? Clinical Impression Statement Patient presents with good motivation for today's session. She did decline to try to walk today due to having some muscle spasms earlier today. She was agreable to seated therex and participated well throughout treatment. She did not have any LOB sitting on Dynadisc and performed well with all postural strengthening with only fatigue as limiting factor. Patient reported feeling tired at end of session. The pt will benefit from further skilled PT to improve mobility, strength, pain, gait and balance in order to decrease fall risk and increase QOL   ? Personal Factors and Comorbidities Age;Comorbidity 3+;Finances;Fitness;Past/Current Experience;Sex;Social Background;Time since onset of injury/illness/exacerbation;Transportation   ? Comorbidities HTN, SVT, neurogenic bowel, MS (1995), neuropathy, hx of wrist fx, HPV, Hypercholesteremia, adiposity,  osteopenia, spasticity.   ? Examination-Activity Limitations Bathing;Bed Mobility;Bend;Carry;Continence;Dressing;Hygiene/Grooming;Stairs;Squat;Reach Overhead;Locomotion Level;Lift;Stand;Transfers;Toileting   ?

## 2021-09-21 ENCOUNTER — Telehealth: Payer: Self-pay | Admitting: Neurology

## 2021-09-21 ENCOUNTER — Other Ambulatory Visit: Payer: Self-pay | Admitting: Neurology

## 2021-09-21 MED ORDER — DANTROLENE SODIUM 25 MG PO CAPS: ORAL_CAPSULE | ORAL | 5 refills | Status: DC

## 2021-09-21 NOTE — Telephone Encounter (Signed)
Pt feels the tiZANidine (ZANAFLEX) 4 MG tablet, with increased amount from Dr Epimenio Foot is not working.  Pt states she has the feeling of pins all over her body(especially legs and down arms), please call ?

## 2021-09-21 NOTE — Telephone Encounter (Signed)
Dr. Felecia Shelling- what would you recommend? I reviewed her chart and only see where she had been on flexeril 5mg  TID back in 2020/2021. No other muscle relaxer's.  ? ?Called and spoke w/ pt. Has had increased spasms in the last week. Confirmed she is still taking baclofen 20mg  po TID and tizanidine 4mg , One po qAM, one po qPM and 2 po qHS. She is also still taking gabapentin 100mg  po BID. ?She denies any signs/sx of illness/infection. Still doing PT. Has not done anything out of her normal activity in the last week.  ?

## 2021-09-22 ENCOUNTER — Ambulatory Visit: Payer: PPO

## 2021-09-22 DIAGNOSIS — R262 Difficulty in walking, not elsewhere classified: Secondary | ICD-10-CM

## 2021-09-22 DIAGNOSIS — R2681 Unsteadiness on feet: Secondary | ICD-10-CM

## 2021-09-22 DIAGNOSIS — R278 Other lack of coordination: Secondary | ICD-10-CM

## 2021-09-22 DIAGNOSIS — R2689 Other abnormalities of gait and mobility: Secondary | ICD-10-CM | POA: Diagnosis not present

## 2021-09-22 DIAGNOSIS — G35 Multiple sclerosis: Secondary | ICD-10-CM

## 2021-09-22 DIAGNOSIS — M6281 Muscle weakness (generalized): Secondary | ICD-10-CM

## 2021-09-22 DIAGNOSIS — R269 Unspecified abnormalities of gait and mobility: Secondary | ICD-10-CM

## 2021-09-22 NOTE — Telephone Encounter (Signed)
LVM for pt letting her know I spoke with Dr. Epimenio Foot who would like her to stop tizanidine since ineffective. She will go ahead and start dantrolene. If she experiences any weakness, she should let us know. Dr. Epimenio Foot would discuss stopping this medication then. Asked her to call back if she has any more questions.

## 2021-09-22 NOTE — Telephone Encounter (Signed)
Called the patient and there was no answer. LVM advising the patient that Dr Epimenio Foot called in a medication. Included the details on how to take in VM and instructed the pt to call back if she has questions.   I sent in the prescription for dantrolene 25 mg tablets.   She should take 1 twice a day for 5 days then  1 three times a day for 5 days then  1 four times a day for 5 days then  2 three times a day

## 2021-09-22 NOTE — Therapy (Signed)
[facility] [address] [place], [place], [address] Phone: [phone]   Fax:  [phone]  Physical Therapy Treatment  Patient Details  Name: Lisa Williams MRN: [id number] Date of Birth: [birth year] Referring Provider (PT): Margette Fast MD   Encounter Date: 09/22/2021   PT End of Session - 09/22/21 2114     Visit Number 59    Number of Visits 97    Date for PT Re-Evaluation 10/18/21    Authorization Type 8/10 PN 08/18/21    PT Start Time 1015    PT Stop Time 1059    PT Time Calculation (min) 44 min    Equipment Utilized During Treatment Gait belt    Activity Tolerance Patient tolerated treatment well;Patient limited by fatigue    Behavior During Therapy [facility] for tasks assessed/performed             Past Medical History:  Diagnosis Date   Abdominal pain, right upper quadrant    Back pain    Calculus of kidney 12/09/2013   Chronic back pain    unspecified   Chronic left shoulder pain 12/19/4816   Complication of anesthesia    Functional disorder of bladder    other   Galactorrhea 11/26/2014   Chronic    Hereditary and idiopathic neuropathy 08/19/2013   HPV test positive    Hypercholesteremia 08/19/2013   Hypertension    Incomplete bladder emptying    Microscopic hematuria    MS (multiple sclerosis) ([facility])    Muscle spasticity 05/21/2014   Nonspecific findings on examination of urine    other   Osteopenia    PONV (postoperative nausea and vomiting)    Status post laparoscopic supracervical hysterectomy 11/26/2014   Tobacco user 11/26/2014   Wrist fracture     Past Surgical History:  Procedure Laterality Date   bilateral tubal ligation  1996   BREAST CYST EXCISION Left 2002   FRACTURE SURGERY     KNEE SURGERY     right   LAPAROSCOPIC SUPRACERVICAL HYSTERECTOMY  08/05/2013   ORIF WRIST FRACTURE Left 01/17/2017   Procedure: OPEN REDUCTION INTERNAL FIXATION (ORIF) WRIST FRACTURE;  Surgeon:  Lovell Sheehan, MD;  Location: [facility];  Service: Orthopedics;  Laterality: Left;   RADIOLOGY WITH ANESTHESIA N/A 03/18/2020   Procedure: MRI WITH ANESTHESIA CERVICAL SPINE AND BRAIN  WITH AND WITHOUT CONTRAST;  Surgeon: Radiologist, Medication, MD;  Location: [facility];  Service: Radiology;  Laterality: N/A;   TUBAL LIGATION Bilateral    VAGINAL HYSTERECTOMY  03/2006    There were no vitals filed for this visit.   Subjective Assessment - 09/22/21 2113     Subjective Patient reports less spasms overall today and agreeable to try to walk.    Pertinent History Patient is a pleasant 61 year old female who is returning to PT after MS exacerbation and hospitalization for COVID. Patient went to SNF for one month and then received home health PT for a month (8 visits).  PMH includes HTN, SVT, neurogenic bowel, MS (1995), neuropathy, hx of wrist fx, HPV, Hypercholesteremia, adiposity, osteopenia, spasticity. She wears bilateral AFOs and uses a RW and/or wheelchair for mobility. She drives an adapted car.    Limitations Lifting;Standing;Walking;House hold activities;Sitting    How long can you sit comfortably? n/a    How long can you stand comfortably? 2 minutes    How long can you walk comfortably? 40 ft in house    Currently in Pain? No/denies  INTERVENTIONS:   Ambulation for endurance/distance- Patient ambulated in hospital hallway using bariatric RW with CGA/Gait belt and close w/c follow. Patient required 1 seated rest break between trials- 1)  107 feet, 42 feet. Patient required only minimal VC for sequencing (wt shifting and ensuring she clears her feet)    Seated Therex using yellow dynadisc-  - scap retract using GTB 2 sets of 12 reps - Shoulder ext using GTB 2 sets of 12 reps - Dynamic reaching for red ball (follow the ball) - each UE approx 20-30 reps each- reaching inside and outside of limits of stability.   Education provided throughout session via VC/TC and  demonstration to facilitate movement at target joints and correct muscle activation for all testing and exercises performed.                            PT Education - 09/22/21 2114     Education provided Yes    Education Details Exercise technique    Person(s) Educated Patient    Methods Explanation;Demonstration;Tactile cues;Verbal cues    Comprehension Verbalized understanding;Returned demonstration;Verbal cues required;Tactile cues required;Need further instruction              PT Short Term Goals - 05/05/21 1253       PT SHORT TERM GOAL #1   Title Patient will be independent in home exercise program to improve strength/mobility for better functional independence with ADLs.    Baseline 10/4 HEP provided 11/8: HEP compliant    Time 4    Period Weeks    Status Achieved    Target Date 03/08/21               PT Long Term Goals - 07/26/21 0001       PT LONG TERM GOAL #1   Title Patient will increase FOTO score to equal to or greater than  51/100   to demonstrate statistically significant improvement in mobility and quality of life.    Baseline 10/4: 42% 11/8: 36% 12/29: 45%; 06/09/2021= 36% 39: 36%    Time 12    Period Weeks    Status On-going    Target Date 10/18/21      PT LONG TERM GOAL #2   Title Patient (< 26 years old) will complete five times sit to stand test in < 20 seconds indicating an increased LE strength and improved balance.    Baseline 10/4: 33.15 seconds with heavy BUE support 11/8: 30 seconds; 12/29: deferred to future session. Pt attempted today but unable to complete d/t increase in R side LBP; 06/09/2021= 27.72 sec with UE support 3/9: 37.74 seconds 3/21: 29 seconds with BUE support    Time 12    Period Weeks    Status Partially Met    Target Date 10/18/21      PT LONG TERM GOAL #3   Title Patient will increase 10 meter walk test to <20 seconds as to improve gait speed for better community ambulation and to reduce fall risk.     Baseline 10/4: 43.17 seconds with RW 11/8: 40.8 seconds; 43 sec (0.23 m/s) with RW; 06/09/2021= 40.7 sec with RW 3/9: 38.8 seconds    Time 12    Period Weeks    Status Partially Met    Target Date 10/18/21      PT LONG TERM GOAL #4   Title Patient will reduce timed up and go to <11 seconds to reduce fall risk  and demonstrate improved transfer/gait ability.    Baseline 10/4: 52.66 seconds with RW 11/8: 49 seconds with RW; 12/29: 44.3 sec; 06/09/2021= 46.45 sec with use of RW 3/9: 39.9    Time 12    Period Weeks    Status Partially Met    Target Date 10/18/21      PT LONG TERM GOAL #5   Title Patient will transfer from her shower seat independently for safety with ADLs.    Baseline 3/21: unable    Time 12    Period Weeks    Status New    Target Date 10/18/21      PT LONG TERM GOAL #6   Title Patient will get into bed without assistance to increase independence with functional mobility.    Baseline 3/21: needs assistance from sister    Time 12    Period Weeks    Status New    Target Date 10/18/21                   Plan - 09/22/21 2115     Clinical Impression Statement Patient continues to perform well with overall ambulation distance-only 1 rest break required today. She was motivated and only limited by fatigue today. She was challenged with seated dynadisc activities and performing well with resistance with postural strengthening. The pt will benefit from further skilled PT to improve mobility, strength, pain, gait and balance in order to decrease fall risk and increase QOL    Personal Factors and Comorbidities Age;Comorbidity 3+;Finances;Fitness;Past/Current Experience;Sex;Social Background;Time since onset of injury/illness/exacerbation;Transportation    Comorbidities HTN, SVT, neurogenic bowel, MS (1995), neuropathy, hx of wrist fx, HPV, Hypercholesteremia, adiposity, osteopenia, spasticity.    Examination-Activity Limitations Bathing;Bed  Mobility;Bend;Carry;Continence;Dressing;Hygiene/Grooming;Stairs;Squat;Reach Overhead;Locomotion Level;Lift;Stand;Transfers;Toileting    Examination-Participation Restrictions Church;Cleaning;Community Activity;Laundry;Volunteer;Shop;Meal Prep;Yard Work    [employer] Evolving/Moderate complexity    Rehab Potential Good    PT Frequency 2x / week    PT Duration 12 weeks    PT Treatment/Interventions ADLs/Self Care Home Management;Aquatic Therapy;Biofeedback;Cryotherapy;Electrical Stimulation;Iontophoresis 71m/ml Dexamethasone;Traction;Moist Heat;Ultrasound;DME Instruction;Gait training;Stair training;Functional mobility training;Therapeutic activities;Therapeutic exercise;Balance training;Neuromuscular re-education;Patient/family education;Orthotic Fit/Training;Wheelchair mobility training;Manual techniques;Compression bandaging;Passive range of motion;Dry needling;Energy conservation;Taping;Splinting;Vestibular    PT Next Visit Plan in // bars weight shift and LE strengthening    PT Home Exercise Plan No changes    Consulted and Agree with Plan of Care Patient             Patient will benefit from skilled therapeutic intervention in order to improve the following deficits and impairments:  Abnormal gait, Decreased activity tolerance, Decreased balance, Decreased coordination, Decreased endurance, Decreased mobility, Decreased range of motion, Decreased strength, Difficulty walking, Impaired perceived functional ability, Impaired sensation, Impaired tone, Impaired UE functional use, Improper body mechanics, Postural dysfunction, Cardiopulmonary status limiting activity, Impaired flexibility, Increased muscle spasms  Visit Diagnosis: Abnormality of gait and mobility  Other abnormalities of gait and mobility  Muscle weakness (generalized)  Unsteadiness on feet  Difficulty in walking, not elsewhere classified  Other lack of coordination  Multiple sclerosis  exacerbation ([facility]     Problem List Patient Active Problem List   Diagnosis Date Noted   Acute respiratory disease due to COVID-19 virus 11/21/2020   Weakness    Hypoalbuminemia due to protein-calorie malnutrition (HCC)    Neurogenic bowel    Neurogenic bladder    Labile blood pressure    Neuropathic pain    Abscess of female pelvis    SVT (supraventricular tachycardia) (HCC)    Radial styloid tenosynovitis  03/12/2018   Wheelchair confinement 02/27/2018   Localized osteoporosis with current pathological fracture with routine healing 01/19/2017   Wrist fracture 01/16/2017   Sprain of ankle 03/23/2016   Closed fracture of lateral malleolus 03/16/2016   Health care maintenance 01/24/2016   Blood pressure elevated without history of HTN 10/25/2015   Essential hypertension 10/25/2015   Multiple sclerosis ([place]) 10/02/2015   Chronic left shoulder pain 07/19/2015   Multiple sclerosis exacerbation ([place]) 07/14/2015   MS (multiple sclerosis) ([place]) 11/26/2014   Increased body mass index 11/26/2014   HPV test positive 11/26/2014   Status post laparoscopic supracervical hysterectomy 11/26/2014   Galactorrhea 11/26/2014   Back ache 05/21/2014   Adiposity 05/21/2014   Disordered sleep 05/21/2014   Muscle spasticity 05/21/2014   Spasticity 05/21/2014   Calculus of kidney 55/83/1674   Renal colic 25/52/5894   Hypercholesteremia 08/19/2013   Hereditary and idiopathic neuropathy 08/19/2013   Hypercholesterolemia without hypertriglyceridemia 08/19/2013   Bladder infection, chronic 07/25/2012   Disorder of bladder function 07/25/2012   Incomplete bladder emptying 07/25/2012   Microscopic hematuria 07/25/2012   Right upper quadrant pain 07/25/2012    Lewis Moccasin, PT 09/22/2021, 10:08 PM  [place] [place], [place], [address] Phone: [phone]   Fax:  [phone]  Name: Lisa Williams MRN:  [id number] Date of Birth: Jun 20, 1960

## 2021-09-22 NOTE — Telephone Encounter (Signed)
at 9:24 Pt left a vm asking if she needs to stop taking her tiZANidine (ZANAFLEX) 4 MG tablet since she will start new medication.  Pt states she will be in physical therapay from 10:15-11:00 but states a vm can be left

## 2021-09-27 ENCOUNTER — Ambulatory Visit: Payer: PPO

## 2021-09-27 DIAGNOSIS — G35 Multiple sclerosis: Secondary | ICD-10-CM

## 2021-09-27 DIAGNOSIS — M6281 Muscle weakness (generalized): Secondary | ICD-10-CM

## 2021-09-27 DIAGNOSIS — R2689 Other abnormalities of gait and mobility: Secondary | ICD-10-CM

## 2021-09-27 DIAGNOSIS — R269 Unspecified abnormalities of gait and mobility: Secondary | ICD-10-CM

## 2021-09-27 DIAGNOSIS — R278 Other lack of coordination: Secondary | ICD-10-CM

## 2021-09-27 DIAGNOSIS — R262 Difficulty in walking, not elsewhere classified: Secondary | ICD-10-CM

## 2021-09-27 DIAGNOSIS — R2681 Unsteadiness on feet: Secondary | ICD-10-CM

## 2021-09-27 NOTE — Therapy (Unsigned)
[place] MAIN [facility] SERVICES [address] [place], [place], [address] Phone: [phone]   Fax:  [phone]  Physical Therapy Treatment/Physical Therapy Progress Note   Dates of reporting period   08/18/2021 to   09/27/2021  Patient Details  Name: Lisa Williams MRN: [id number] Date of Birth: [birth year] Referring Provider (PT): Margette Fast MD   Encounter Date: 09/27/2021   PT End of Session - 09/27/21 0744     Visit Number 60    Number of Visits 21    Date for PT Re-Evaluation 10/18/21    Authorization Type 8/10 PN 08/18/21    PT Start Time 1016    PT Stop Time 1059    PT Time Calculation (min) 43 min    Equipment Utilized During Treatment Gait belt    Activity Tolerance Patient tolerated treatment well;Patient limited by fatigue    Behavior During Therapy [facility] for tasks assessed/performed             Past Medical History:  Diagnosis Date   Abdominal pain, right upper quadrant    Back pain    Calculus of kidney 12/09/2013   Chronic back pain    unspecified   Chronic left shoulder pain 9/73/5329   Complication of anesthesia    Functional disorder of bladder    other   Galactorrhea 11/26/2014   Chronic    Hereditary and idiopathic neuropathy 08/19/2013   HPV test positive    Hypercholesteremia 08/19/2013   Hypertension    Incomplete bladder emptying    Microscopic hematuria    MS (multiple sclerosis) (HCC)    Muscle spasticity 05/21/2014   Nonspecific findings on examination of urine    other   Osteopenia    PONV (postoperative nausea and vomiting)    Status post laparoscopic supracervical hysterectomy 11/26/2014   Tobacco user 11/26/2014   Wrist fracture     Past Surgical History:  Procedure Laterality Date   bilateral tubal ligation  1996   BREAST CYST EXCISION Left 2002   FRACTURE SURGERY     KNEE SURGERY     right   LAPAROSCOPIC SUPRACERVICAL HYSTERECTOMY  08/05/2013   ORIF WRIST FRACTURE Left  01/17/2017   Procedure: OPEN REDUCTION INTERNAL FIXATION (ORIF) WRIST FRACTURE;  Surgeon: Lovell Sheehan, MD;  Location: [facility] ORS;  Service: Orthopedics;  Laterality: Left;   RADIOLOGY WITH ANESTHESIA N/A 03/18/2020   Procedure: MRI WITH ANESTHESIA CERVICAL SPINE AND BRAIN  WITH AND WITHOUT CONTRAST;  Surgeon: Radiologist, Medication, MD;  Location: [place];  Service: Radiology;  Laterality: N/A;   TUBAL LIGATION Bilateral    VAGINAL HYSTERECTOMY  03/2006    There were no vitals filed for this visit.   Subjective Assessment - 09/27/21 0742     Subjective Patient reports no issues today and states she had a good weekend.    Pertinent History Patient is a pleasant 61 year old female who is returning to PT after MS exacerbation and hospitalization for COVID. Patient went to SNF for one month and then received home health PT for a month (8 visits).  PMH includes HTN, SVT, neurogenic bowel, MS (1995), neuropathy, hx of wrist fx, HPV, Hypercholesteremia, adiposity, osteopenia, spasticity. She wears bilateral AFOs and uses a RW and/or wheelchair for mobility. She drives an adapted car.    Limitations Lifting;Standing;Walking;House hold activities;Sitting    How long can you sit comfortably? n/a    How long can you stand comfortably? 2 minutes    How long  can you walk comfortably? 40 ft in house    Currently in Pain? No/denies           *Progress note for visit number 60 today- Unable to reassess goals due to recent report of mm spassms. Will attempt to reassess next visit.    INTERVENTIONS:   Therex  Standing activities in // bars:  -standing  with lateral weight shifting  with only 1 UE support x 15 reps each  -lateral step 10x each LE  --sit to stand 5x with rest breaks.  -Standing with chest press using PVC x 10 reps - Standing with attempted overhead press x 10 reps  Seated activities in wheelchair    -Scap row 10x  x 2 sets - Shoulder ext 10 reps x 2 sets GTB -Shoulder  horizontal ABD with GTB 2 sets of 12 reps -Shoulder elevation D2 PNF- 10x each side  -GTB abduction 10xeach LE  Education provided throughout session via VC/TC and demonstration to facilitate movement at target joints and correct muscle activation for all testing and exercises performed.                      PT Education - 09/28/21 0744     Education provided Yes    Education Details Exercise technique    Person(s) Educated Patient    Methods Explanation;Demonstration;Tactile cues;Verbal cues    Comprehension Verbalized understanding;Returned demonstration;Verbal cues required;Tactile cues required              PT Short Term Goals - 05/05/21 1253       PT SHORT TERM GOAL #1   Title Patient will be independent in home exercise program to improve strength/mobility for better functional independence with ADLs.    Baseline 10/4 HEP provided 11/8: HEP compliant    Time 4    Period Weeks    Status Achieved    Target Date 03/08/21               PT Long Term Goals - 09/28/21 0747       PT LONG TERM GOAL #1   Title Patient will increase FOTO score to equal to or greater than  51/100   to demonstrate statistically significant improvement in mobility and quality of life.    Baseline 10/4: 42% 11/8: 36% 12/29: 45%; 06/09/2021= 36% 39: 36%    Time 12    Period Weeks    Status On-going    Target Date 10/18/21      PT LONG TERM GOAL #2   Title Patient (< 50 years old) will complete five times sit to stand test in < 20 seconds indicating an increased LE strength and improved balance.    Baseline 10/4: 33.15 seconds with heavy BUE support 11/8: 30 seconds; 12/29: deferred to future session. Pt attempted today but unable to complete d/t increase in R side LBP; 06/09/2021= 27.72 sec with UE support 3/9: 37.74 seconds 3/21: 29 seconds with BUE support. 09/27/2021- Did not assess secondary to recent report of increased MM spasms- Will attempt to address next visit if  appropriate.    Time 12    Period Weeks    Status Partially Met      PT LONG TERM GOAL #3   Title Patient will increase 10 meter walk test to <20 seconds as to improve gait speed for better community ambulation and to reduce fall risk.    Baseline 10/4: 43.17 seconds with RW 11/8: 40.8 seconds; 43 sec (0.23  m/s) with RW; 06/09/2021= 40.7 sec with RW 3/9: 38.8 seconds. 09/27/2021- DId not assess secondary to patient recent c/o of mm spasms- will attempt to address next visit.    Time 12    Period Weeks    Status Partially Met    Target Date 10/18/21      PT LONG TERM GOAL #4   Title Patient will reduce timed up and go to <11 seconds to reduce fall risk and demonstrate improved transfer/gait ability.    Baseline 10/4: 52.66 seconds with RW 11/8: 49 seconds with RW; 12/29: 44.3 sec; 06/09/2021= 46.45 sec with use of RW 3/9: 39.9. 09/27/2021- Did not assess due to recent mm spams- will attempt to reassess next visit if appropriate.    Time 12    Period Weeks    Status Partially Met    Target Date 10/18/21      PT LONG TERM GOAL #5   Title Patient will transfer from her shower seat independently for safety with ADLs.    Baseline 3/21: unable    Time 12    Period Weeks    Status New    Target Date 10/18/21                   Plan - 09/27/21 0745     Clinical Impression Statement Patient presents with no report of mm spasms today but goals deferred due to recent complaint all last week. She was able to demo sit to stand with Supervision to SBA assist and limited today secondary to fatigue. She was able to stand erect and with 1 UE support for brief time during activities today.  Patient's condition has the potential to improve in response to therapy. Maximum improvement is yet to be obtained. The anticipated improvement is attainable and reasonable in a generally predictable time. The pt will benefit from further skilled PT to improve mobility, strength, pain, gait and balance in order to  decrease fall risk and increase QOL    Personal Factors and Comorbidities Age;Comorbidity 3+;Finances;Fitness;Past/Current Experience;Sex;Social Background;Time since onset of injury/illness/exacerbation;Transportation    Comorbidities HTN, SVT, neurogenic bowel, MS (1995), neuropathy, hx of wrist fx, HPV, Hypercholesteremia, adiposity, osteopenia, spasticity.    Examination-Activity Limitations Bathing;Bed Mobility;Bend;Carry;Continence;Dressing;Hygiene/Grooming;Stairs;Squat;Reach Overhead;Locomotion Level;Lift;Stand;Transfers;Toileting    Examination-Participation Restrictions Church;Cleaning;Community Activity;Laundry;Volunteer;Shop;Meal Prep;Yard Work    Merchant navy officer Evolving/Moderate complexity    Rehab Potential Good    PT Frequency 2x / week    PT Duration 12 weeks    PT Treatment/Interventions ADLs/Self Care Home Management;Aquatic Therapy;Biofeedback;Cryotherapy;Electrical Stimulation;Iontophoresis 41m/ml Dexamethasone;Traction;Moist Heat;Ultrasound;DME Instruction;Gait training;Stair training;Functional mobility training;Therapeutic activities;Therapeutic exercise;Balance training;Neuromuscular re-education;Patient/family education;Orthotic Fit/Training;Wheelchair mobility training;Manual techniques;Compression bandaging;Passive range of motion;Dry needling;Energy conservation;Taping;Splinting;Vestibular    PT Next Visit Plan in // bars weight shift and LE strengthening    PT Home Exercise Plan No changes    Consulted and Agree with Plan of Care Patient             Patient will benefit from skilled therapeutic intervention in order to improve the following deficits and impairments:  Abnormal gait, Decreased activity tolerance, Decreased balance, Decreased coordination, Decreased endurance, Decreased mobility, Decreased range of motion, Decreased strength, Difficulty walking, Impaired perceived functional ability, Impaired sensation, Impaired tone, Impaired UE  functional use, Improper body mechanics, Postural dysfunction, Cardiopulmonary status limiting activity, Impaired flexibility, Increased muscle spasms  Visit Diagnosis: Abnormality of gait and mobility  Other abnormalities of gait and mobility  Muscle weakness (generalized)  Unsteadiness on feet  Difficulty in walking, not elsewhere classified  Other lack of coordination  Multiple sclerosis exacerbation [facility])     Problem List Patient Active Problem List   Diagnosis Date Noted   Acute respiratory disease due to COVID-19 virus 11/21/2020   Weakness    Hypoalbuminemia due to protein-calorie malnutrition (HCC)    Neurogenic bowel    Neurogenic bladder    Labile blood pressure    Neuropathic pain    Abscess of female pelvis    SVT (supraventricular tachycardia) ([place])    Radial styloid tenosynovitis 03/12/2018   Wheelchair confinement 02/27/2018   Localized osteoporosis with current pathological fracture with routine healing 01/19/2017   Wrist fracture 01/16/2017   Sprain of ankle 03/23/2016   Closed fracture of lateral malleolus 03/16/2016   Health care maintenance 01/24/2016   Blood pressure elevated without history of HTN 10/25/2015   Essential hypertension 10/25/2015   Multiple sclerosis ([place]) 10/02/2015   Chronic left shoulder pain 07/19/2015   Multiple sclerosis exacerbation ([place]) 07/14/2015   MS (multiple sclerosis) ([place]) 11/26/2014   Increased body mass index 11/26/2014   HPV test positive 11/26/2014   Status post laparoscopic supracervical hysterectomy 11/26/2014   Galactorrhea 11/26/2014   Back ache 05/21/2014   Adiposity 05/21/2014   Disordered sleep 05/21/2014   Muscle spasticity 05/21/2014   Spasticity 05/21/2014   Calculus of kidney 86/48/4720   Renal colic 72/18/2883   Hypercholesteremia 08/19/2013   Hereditary and idiopathic neuropathy 08/19/2013   Hypercholesterolemia without hypertriglyceridemia 08/19/2013   Bladder infection, chronic 07/25/2012    Disorder of bladder function 07/25/2012   Incomplete bladder emptying 07/25/2012   Microscopic hematuria 07/25/2012   Right upper quadrant pain 07/25/2012    Lewis Moccasin, PT 09/28/2021, 7:57 AM  [place], [place], [address] Phone: [phone]   Fax:  [phone]  Name: Lisa Williams MRN: [id number] Date of Birth: [birth year]

## 2021-09-29 ENCOUNTER — Ambulatory Visit: Payer: PPO

## 2021-09-29 DIAGNOSIS — R2681 Unsteadiness on feet: Secondary | ICD-10-CM

## 2021-09-29 DIAGNOSIS — G35 Multiple sclerosis: Secondary | ICD-10-CM

## 2021-09-29 DIAGNOSIS — R269 Unspecified abnormalities of gait and mobility: Secondary | ICD-10-CM

## 2021-09-29 DIAGNOSIS — R262 Difficulty in walking, not elsewhere classified: Secondary | ICD-10-CM

## 2021-09-29 DIAGNOSIS — M6281 Muscle weakness (generalized): Secondary | ICD-10-CM

## 2021-09-29 DIAGNOSIS — R2689 Other abnormalities of gait and mobility: Secondary | ICD-10-CM | POA: Diagnosis not present

## 2021-09-29 DIAGNOSIS — R278 Other lack of coordination: Secondary | ICD-10-CM

## 2021-09-29 NOTE — Therapy (Signed)
[place] [facility] MAIN [facility] SERVICES [address] [place], [place], [address] Phone: [phone]   Fax:  [phone]  Physical Therapy Treatment  Patient Details  Name: Lisa Williams MRN: [id number] Date of Birth: [birth year] Referring Provider (PT): Stephanie Acre MD   Encounter Date: 09/29/2021   PT End of Session - 09/29/21 1257     Visit Number 61    Number of Visits 67    Date for PT Re-Evaluation 10/18/21    Authorization Type 8/10 PN 08/18/21    PT Start Time 1015    PT Stop Time 1058    PT Time Calculation (min) 43 min    Equipment Utilized During Treatment Gait belt    Activity Tolerance Patient tolerated treatment well;Patient limited by fatigue    Behavior During Therapy [facility] for tasks assessed/performed             Past Medical History:  Diagnosis Date   Abdominal pain, right upper quadrant    Back pain    Calculus of kidney 12/09/2013   Chronic back pain    unspecified   Chronic left shoulder pain 07/19/2015   Complication of anesthesia    Functional disorder of bladder    other   Galactorrhea 11/26/2014   Chronic    Hereditary and idiopathic neuropathy 08/19/2013   HPV test positive    Hypercholesteremia 08/19/2013   Hypertension    Incomplete bladder emptying    Microscopic hematuria    MS (multiple sclerosis) (HCC)    Muscle spasticity 05/21/2014   Nonspecific findings on examination of urine    other   Osteopenia    PONV (postoperative nausea and vomiting)    Status post laparoscopic supracervical hysterectomy 11/26/2014   Tobacco user 11/26/2014   Wrist fracture     Past Surgical History:  Procedure Laterality Date   bilateral tubal ligation  1996   BREAST CYST EXCISION Left 2002   FRACTURE SURGERY     KNEE SURGERY     right   LAPAROSCOPIC SUPRACERVICAL HYSTERECTOMY  08/05/2013   ORIF WRIST FRACTURE Left 01/17/2017   Procedure: OPEN REDUCTION INTERNAL FIXATION (ORIF) WRIST FRACTURE;  Surgeon:  Lyndle Herrlich, MD;  Location: [facility] ORS;  Service: Orthopedics;  Laterality: Left;   RADIOLOGY WITH ANESTHESIA N/A 03/18/2020   Procedure: MRI WITH ANESTHESIA CERVICAL SPINE AND BRAIN  WITH AND WITHOUT CONTRAST;  Surgeon: Radiologist, Medication, MD;  Location: [facility] OR;  Service: Radiology;  Laterality: N/A;   TUBAL LIGATION Bilateral    VAGINAL HYSTERECTOMY  03/2006    There were no vitals filed for this visit.   Subjective Assessment - 09/29/21 1254     Subjective Patient reports having a good day with no reported spasms.    Pertinent History Patient is a pleasant 61 year old female who is returning to PT after MS exacerbation and hospitalization for COVID. Patient went to SNF for one month and then received home health PT for a month (8 visits).  PMH includes HTN, SVT, neurogenic bowel, MS (1995), neuropathy, hx of wrist fx, HPV, Hypercholesteremia, adiposity, osteopenia, spasticity. She wears bilateral AFOs and uses a RW and/or wheelchair for mobility. She drives an adapted car.    Limitations Lifting;Standing;Walking;House hold activities;Sitting    How long can you sit comfortably? n/a    How long can you stand comfortably? 2 minutes    How long can you walk comfortably? 40 ft in house    Patient Stated Goals to return to [facility]  prior to hospitalization    Currently in Pain? No/denies             INTERVENTIONS:  Reassessed FOTO= 51 (improving)   Therapeutic exercises:   Patient ambulated in hospital hallways using bariatric RW, CGA, Gait belt and close w/c follow- able to clear feet bilaterally and weight shift well until fatigued. She has a good understanding of stopping when fatigued. She required 2 rest breaks to complete 260 feet distance today. Patient expressed excitement stating she felt very accomplished and tired but not exhausted. Occasional VC for cadence and close observation to watch out for muscle fatigue that may increase her risk of falling.   Education provided  throughout session via VC/TC and demonstration to facilitate movement at target joints and correct muscle activation for all testing and exercises performed.                       PT Education - 09/29/21 1256     Education provided Yes    Education Details Safety cues for transfers/gait    Person(s) Educated Patient    Methods Explanation;Tactile cues;Verbal cues    Comprehension Verbalized understanding;Returned demonstration;Verbal cues required;Tactile cues required;Need further instruction              PT Short Term Goals - 05/05/21 1253       PT SHORT TERM GOAL #1   Title Patient will be independent in home exercise program to improve strength/mobility for better functional independence with ADLs.    Baseline 10/4 HEP provided 11/8: HEP compliant    Time 4    Period Weeks    Status Achieved    Target Date 03/08/21               PT Long Term Goals - 09/29/21 1513       PT LONG TERM GOAL #1   Title Patient will increase FOTO score to equal to or greater than  51/100   to demonstrate statistically significant improvement in mobility and quality of life.    Baseline 10/4: 42% 11/8: 36% 12/29: 45%; 06/09/2021= 36% 39: 36%    Time 12    Period Weeks    Status On-going    Target Date 10/18/21                   Plan - 09/29/21 1257     Clinical Impression Statement Patient presented with excellent motivation and treatment focused on gait endurance today. She exhibited good response -ambulating over 200 feet with minimal VC for safe technique. She also demo improved FOTO score indicating a significant improvement in self perceived ability. Overall - progressing well with improving gait distance without compromising gait mechanics. The pt will benefit from further skilled PT to improve mobility, strength, pain, gait and balance in order to decrease fall risk and increase QOL    Personal Factors and Comorbidities Age;Comorbidity  3+;Finances;Fitness;Past/Current Experience;Sex;Social Background;Time since onset of injury/illness/exacerbation;Transportation    Comorbidities HTN, SVT, neurogenic bowel, MS (1995), neuropathy, hx of wrist fx, HPV, Hypercholesteremia, adiposity, osteopenia, spasticity.    Examination-Activity Limitations Bathing;Bed Mobility;Bend;Carry;Continence;Dressing;Hygiene/Grooming;Stairs;Squat;Reach Overhead;Locomotion Level;Lift;Stand;Transfers;Toileting    Examination-Participation Restrictions Church;Cleaning;Community Activity;Laundry;Volunteer;Shop;Meal Prep;Yard Work    Conservation officer, historic buildings Evolving/Moderate complexity    Rehab Potential Good    PT Frequency 2x / week    PT Duration 12 weeks    PT Treatment/Interventions ADLs/Self Care Home Management;Aquatic Therapy;Biofeedback;Cryotherapy;Electrical Stimulation;Iontophoresis 4mg /ml Dexamethasone;Traction;Moist Heat;Ultrasound;DME Instruction;Gait training;Stair training;Functional mobility training;Therapeutic activities;Therapeutic exercise;Balance training;Neuromuscular re-education;Patient/family education;Orthotic  Fit/Training;Wheelchair mobility training;Manual techniques;Compression bandaging;Passive range of motion;Dry needling;Energy conservation;Taping;Splinting;Vestibular    PT Next Visit Plan in // bars weight shift and LE strengthening    PT Home Exercise Plan No changes    Consulted and Agree with Plan of Care Patient             Patient will benefit from skilled therapeutic intervention in order to improve the following deficits and impairments:  Abnormal gait, Decreased activity tolerance, Decreased balance, Decreased coordination, Decreased endurance, Decreased mobility, Decreased range of motion, Decreased strength, Difficulty walking, Impaired perceived functional ability, Impaired sensation, Impaired tone, Impaired UE functional use, Improper body mechanics, Postural dysfunction, Cardiopulmonary status limiting  activity, Impaired flexibility, Increased muscle spasms  Visit Diagnosis: Abnormality of gait and mobility  Other abnormalities of gait and mobility  Muscle weakness (generalized)  Unsteadiness on feet  Difficulty in walking, not elsewhere classified  Other lack of coordination  Multiple sclerosis exacerbation (HCC)     Problem List Patient Active Problem List   Diagnosis Date Noted   Acute respiratory disease due to COVID-19 virus 11/21/2020   Weakness    Hypoalbuminemia due to protein-calorie malnutrition (HCC)    Neurogenic bowel    Neurogenic bladder    Labile blood pressure    Neuropathic pain    Abscess of female pelvis    SVT (supraventricular tachycardia) (HCC)    Radial styloid tenosynovitis 03/12/2018   Wheelchair confinement 02/27/2018   Localized osteoporosis with current pathological fracture with routine healing 01/19/2017   Wrist fracture 01/16/2017   Sprain of ankle 03/23/2016   Closed fracture of lateral malleolus 03/16/2016   Health care maintenance 01/24/2016   Blood pressure elevated without history of HTN 10/25/2015   Essential hypertension 10/25/2015   Multiple sclerosis (HCC) 10/02/2015   Chronic left shoulder pain 07/19/2015   Multiple sclerosis exacerbation (HCC) 07/14/2015   MS (multiple sclerosis) (HCC) 11/26/2014   Increased body mass index 11/26/2014   HPV test positive 11/26/2014   Status post laparoscopic supracervical hysterectomy 11/26/2014   Galactorrhea 11/26/2014   Back ache 05/21/2014   Adiposity 05/21/2014   Disordered sleep 05/21/2014   Muscle spasticity 05/21/2014   Spasticity 05/21/2014   Calculus of kidney 12/09/2013   Renal colic 12/09/2013   Hypercholesteremia 08/19/2013   Hereditary and idiopathic neuropathy 08/19/2013   Hypercholesterolemia without hypertriglyceridemia 08/19/2013   Bladder infection, chronic 07/25/2012   Disorder of bladder function 07/25/2012   Incomplete bladder emptying 07/25/2012    Microscopic hematuria 07/25/2012   Right upper quadrant pain 07/25/2012    Lenda Kelp, PT 09/29/2021, 3:54 PM  [place] [facility] MAIN [facility] SERVICES [address] [place], [place], [address] Phone: [phone]   Fax:  [phone]  Name: Lisa Williams MRN: [id number] Date of Birth: [birth year]

## 2021-10-04 ENCOUNTER — Ambulatory Visit: Payer: PPO

## 2021-10-04 DIAGNOSIS — R2681 Unsteadiness on feet: Secondary | ICD-10-CM

## 2021-10-04 DIAGNOSIS — R2689 Other abnormalities of gait and mobility: Secondary | ICD-10-CM

## 2021-10-04 DIAGNOSIS — R269 Unspecified abnormalities of gait and mobility: Secondary | ICD-10-CM

## 2021-10-04 DIAGNOSIS — M6281 Muscle weakness (generalized): Secondary | ICD-10-CM

## 2021-10-04 NOTE — Therapy (Signed)
[facility] MAIN [facility] SERVICES [address] [place], [place], [address] Phone: [phone]   Fax:  [phone]  Physical Therapy Treatment  Patient Details  Name: Lisa Williams MRN: [id number] Date of Birth: Oct 15, 1960 Referring Provider (PT): Stephanie Acre MD   Encounter Date: 10/04/2021   PT End of Session - 10/04/21 1012     Visit Number 62    Number of Visits 67    Date for PT Re-Evaluation 10/18/21    Authorization Type 8/10 PN 08/18/21    PT Start Time 1015    PT Stop Time 1059    PT Time Calculation (min) 44 min    Equipment Utilized During Treatment Gait belt    Activity Tolerance Patient tolerated treatment well;Patient limited by fatigue    Behavior During Therapy [facility] for tasks assessed/performed             Past Medical History:  Diagnosis Date   Abdominal pain, right upper quadrant    Back pain    Calculus of kidney 12/09/2013   Chronic back pain    unspecified   Chronic left shoulder pain 07/19/2015   Complication of anesthesia    Functional disorder of bladder    other   Galactorrhea 11/26/2014   Chronic    Hereditary and idiopathic neuropathy 08/19/2013   HPV test positive    Hypercholesteremia 08/19/2013   Hypertension    Incomplete bladder emptying    Microscopic hematuria    MS (multiple sclerosis) (HCC)    Muscle spasticity 05/21/2014   Nonspecific findings on examination of urine    other   Osteopenia    PONV (postoperative nausea and vomiting)    Status post laparoscopic supracervical hysterectomy 11/26/2014   Tobacco user 11/26/2014   Wrist fracture     Past Surgical History:  Procedure Laterality Date   bilateral tubal ligation  1996   BREAST CYST EXCISION Left 2002   FRACTURE SURGERY     KNEE SURGERY     right   LAPAROSCOPIC SUPRACERVICAL HYSTERECTOMY  08/05/2013   ORIF WRIST FRACTURE Left 01/17/2017   Procedure: OPEN REDUCTION INTERNAL FIXATION (ORIF) WRIST FRACTURE;  Surgeon:  Lyndle Herrlich, MD;  Location: [facility] ORS;  Service: Orthopedics;  Laterality: Left;   RADIOLOGY WITH ANESTHESIA N/A 03/18/2020   Procedure: MRI WITH ANESTHESIA CERVICAL SPINE AND BRAIN  WITH AND WITHOUT CONTRAST;  Surgeon: Radiologist, Medication, MD;  Location: [facility];  Service: Radiology;  Laterality: N/A;   TUBAL LIGATION Bilateral    VAGINAL HYSTERECTOMY  03/2006    There were no vitals filed for this visit.   Subjective Assessment - 10/04/21 1058     Subjective Patient reports she has a dentist appointment today. has been compliant with HEP, no falls or LOB since last session.    Pertinent History Patient is a pleasant 61 year old female who is returning to PT after MS exacerbation and hospitalization for COVID. Patient went to SNF for one month and then received home health PT for a month (8 visits).  PMH includes HTN, SVT, neurogenic bowel, MS (1995), neuropathy, hx of wrist fx, HPV, Hypercholesteremia, adiposity, osteopenia, spasticity. She wears bilateral AFOs and uses a RW and/or wheelchair for mobility. She drives an adapted car.    Limitations Lifting;Standing;Walking;House hold activities;Sitting    How long can you sit comfortably? n/a    How long can you stand comfortably? 2 minutes    How long can you walk comfortably? 40 ft in house  Patient Stated Goals to return to PLOF prior to hospitalization    Currently in Pain? No/denies               INTERVENTIONS:     Ambulation for endurance/distance- Patient ambulated in hospital hallway using bariatric RW with CGA/Gait belt and close w/c follow. Patient required 1 seated rest break between trials- 1)  98 feet, 44 feet. Patient required only minimal VC for sequencing (wt shifting and ensuring she clears her feet)     Toileting with mod A for transfer from toilet to standing due to low toilet height. Attempted a second time repositioned angle of patient on toilet to allow for supervision sit to stand   Education provided  throughout session via VC/TC and demonstration to facilitate movement at target joints and correct muscle activation for all testing and exercises performed.      Patient tolerates ambulation in hospital upstairs hallway at entrance to hospital with increased challenge of flooring with slippery tiles with one seated rest break. Sit to stand from standard height toilet very challenging requiring assistance and repositioning. The pt will benefit from further skilled PT to improve mobility, strength, pain, gait and balance in order to decrease fall risk and increase QOL                      PT Education - 10/04/21 1012     Education provided Yes    Education Details exercise technique, body mechanics    Person(s) Educated Patient    Methods Explanation;Demonstration;Tactile cues;Verbal cues    Comprehension Verbalized understanding;Returned demonstration;Verbal cues required;Tactile cues required              PT Short Term Goals - 05/05/21 1253       PT SHORT TERM GOAL #1   Title Patient will be independent in home exercise program to improve strength/mobility for better functional independence with ADLs.    Baseline 10/4 HEP provided 11/8: HEP compliant    Time 4    Period Weeks    Status Achieved    Target Date 03/08/21               PT Long Term Goals - 09/29/21 1513       PT LONG TERM GOAL #1   Title Patient will increase FOTO score to equal to or greater than  51/100   to demonstrate statistically significant improvement in mobility and quality of life.    Baseline 10/4: 42% 11/8: 36% 12/29: 45%; 06/09/2021= 36% 39: 36%    Time 12    Period Weeks    Status On-going    Target Date 10/18/21                   Plan - 10/04/21 1058     Clinical Impression Statement Patient tolerates ambulation in hospital upstairs hallway at entrance to hospital with increased challenge of flooring with slippery tiles with one seated rest break. Sit to stand  from standard height toilet very challenging requiring assistance and repositioning. The pt will benefit from further skilled PT to improve mobility, strength, pain, gait and balance in order to decrease fall risk and increase QOL    Personal Factors and Comorbidities Age;Comorbidity 3+;Finances;Fitness;Past/Current Experience;Sex;Social Background;Time since onset of injury/illness/exacerbation;Transportation    Comorbidities HTN, SVT, neurogenic bowel, MS (1995), neuropathy, hx of wrist fx, HPV, Hypercholesteremia, adiposity, osteopenia, spasticity.    Examination-Activity Limitations Bathing;Bed Mobility;Bend;Carry;Continence;Dressing;Hygiene/Grooming;Stairs;Squat;Reach Overhead;Locomotion Level;Lift;Stand;Transfers;Toileting    Examination-Participation Restrictions Church;Cleaning;Community Activity;Laundry;Volunteer;Shop;Meal  Prep;Yard Work    Stability/Clinical Decision Making Evolving/Moderate complexity    Rehab Potential Good    PT Frequency 2x / week    PT Duration 12 weeks    PT Treatment/Interventions ADLs/Self Care Home Management;Aquatic Therapy;Biofeedback;Cryotherapy;Electrical Stimulation;Iontophoresis 4mg /ml Dexamethasone;Traction;Moist Heat;Ultrasound;DME Instruction;Gait training;Stair training;Functional mobility training;Therapeutic activities;Therapeutic exercise;Balance training;Neuromuscular re-education;Patient/family education;Orthotic Fit/Training;Wheelchair mobility training;Manual techniques;Compression bandaging;Passive range of motion;Dry needling;Energy conservation;Taping;Splinting;Vestibular    PT Next Visit Plan in // bars weight shift and LE strengthening    PT Home Exercise Plan No changes    Consulted and Agree with Plan of Care Patient             Patient will benefit from skilled therapeutic intervention in order to improve the following deficits and impairments:  Abnormal gait, Decreased activity tolerance, Decreased balance, Decreased coordination,  Decreased endurance, Decreased mobility, Decreased range of motion, Decreased strength, Difficulty walking, Impaired perceived functional ability, Impaired sensation, Impaired tone, Impaired UE functional use, Improper body mechanics, Postural dysfunction, Cardiopulmonary status limiting activity, Impaired flexibility, Increased muscle spasms  Visit Diagnosis: Abnormality of gait and mobility  Other abnormalities of gait and mobility  Muscle weakness (generalized)  Unsteadiness on feet     Problem List Patient Active Problem List   Diagnosis Date Noted   Acute respiratory disease due to COVID-19 virus 11/21/2020   Weakness    Hypoalbuminemia due to protein-calorie malnutrition (HCC)    Neurogenic bowel    Neurogenic bladder    Labile blood pressure    Neuropathic pain    Abscess of female pelvis    SVT (supraventricular tachycardia) (HCC)    Radial styloid tenosynovitis 03/12/2018   Wheelchair confinement 02/27/2018   Localized osteoporosis with current pathological fracture with routine healing 01/19/2017   Wrist fracture 01/16/2017   Sprain of ankle 03/23/2016   Closed fracture of lateral malleolus 03/16/2016   Health care maintenance 01/24/2016   Blood pressure elevated without history of HTN 10/25/2015   Essential hypertension 10/25/2015   Multiple sclerosis (HCC) 10/02/2015   Chronic left shoulder pain 07/19/2015   Multiple sclerosis exacerbation (HCC) 07/14/2015   MS (multiple sclerosis) (HCC) 11/26/2014   Increased body mass index 11/26/2014   HPV test positive 11/26/2014   Status post laparoscopic supracervical hysterectomy 11/26/2014   Galactorrhea 11/26/2014   Back ache 05/21/2014   Adiposity 05/21/2014   Disordered sleep 05/21/2014   Muscle spasticity 05/21/2014   Spasticity 05/21/2014   Calculus of kidney 12/09/2013   Renal colic 12/09/2013   Hypercholesteremia 08/19/2013   Hereditary and idiopathic neuropathy 08/19/2013   Hypercholesterolemia without  hypertriglyceridemia 08/19/2013   Bladder infection, chronic 07/25/2012   Disorder of bladder function 07/25/2012   Incomplete bladder emptying 07/25/2012   Microscopic hematuria 07/25/2012   Right upper quadrant pain 07/25/2012    07/27/2012, PT, DPT  10/04/2021, 11:01 AM  [place] [facility] MAIN [facility] SERVICES [address] [place], [place], [place] Phone: [phone]   Fax:  [phone]  Name: Lisa Williams MRN: [id number] Sale Date of Birth: 1960/06/06

## 2021-10-06 ENCOUNTER — Ambulatory Visit: Payer: PPO | Attending: Neurology

## 2021-10-06 DIAGNOSIS — R2689 Other abnormalities of gait and mobility: Secondary | ICD-10-CM | POA: Insufficient documentation

## 2021-10-06 DIAGNOSIS — R2681 Unsteadiness on feet: Secondary | ICD-10-CM | POA: Diagnosis not present

## 2021-10-06 DIAGNOSIS — R262 Difficulty in walking, not elsewhere classified: Secondary | ICD-10-CM | POA: Diagnosis not present

## 2021-10-06 DIAGNOSIS — M5412 Radiculopathy, cervical region: Secondary | ICD-10-CM | POA: Insufficient documentation

## 2021-10-06 DIAGNOSIS — G35 Multiple sclerosis: Secondary | ICD-10-CM | POA: Insufficient documentation

## 2021-10-06 DIAGNOSIS — R278 Other lack of coordination: Secondary | ICD-10-CM | POA: Insufficient documentation

## 2021-10-06 DIAGNOSIS — M6281 Muscle weakness (generalized): Secondary | ICD-10-CM | POA: Diagnosis not present

## 2021-10-06 DIAGNOSIS — R269 Unspecified abnormalities of gait and mobility: Secondary | ICD-10-CM | POA: Insufficient documentation

## 2021-10-06 NOTE — Therapy (Signed)
[place] [facility] MAIN [facility] SERVICES [address] [place], [place], [address] Phone: [phone]   Fax:  [phone]  Physical Therapy Treatment  Patient Details  Name: Lisa Williams MRN: [id number] Date of Birth: [birth year] Referring Provider (PT): Stephanie Acre MD   Encounter Date: 10/06/2021   PT End of Session - 10/06/21 1005     Visit Number 63    Number of Visits 67    Date for PT Re-Evaluation 10/18/21    PT Start Time 1015    PT Stop Time 1059    PT Time Calculation (min) 44 min    Equipment Utilized During Treatment Gait belt    Activity Tolerance Patient tolerated treatment well;Patient limited by fatigue    Behavior During Therapy [facility] for tasks assessed/performed             Past Medical History:  Diagnosis Date   Abdominal pain, right upper quadrant    Back pain    Calculus of kidney 12/09/2013   Chronic back pain    unspecified   Chronic left shoulder pain 07/19/2015   Complication of anesthesia    Functional disorder of bladder    other   Galactorrhea 11/26/2014   Chronic    Hereditary and idiopathic neuropathy 08/19/2013   HPV test positive    Hypercholesteremia 08/19/2013   Hypertension    Incomplete bladder emptying    Microscopic hematuria    MS (multiple sclerosis) (HCC)    Muscle spasticity 05/21/2014   Nonspecific findings on examination of urine    other   Osteopenia    PONV (postoperative nausea and vomiting)    Status post laparoscopic supracervical hysterectomy 11/26/2014   Tobacco user 11/26/2014   Wrist fracture     Past Surgical History:  Procedure Laterality Date   bilateral tubal ligation  1996   BREAST CYST EXCISION Left 2002   FRACTURE SURGERY     KNEE SURGERY     right   LAPAROSCOPIC SUPRACERVICAL HYSTERECTOMY  08/05/2013   ORIF WRIST FRACTURE Left 01/17/2017   Procedure: OPEN REDUCTION INTERNAL FIXATION (ORIF) WRIST FRACTURE;  Surgeon: Lyndle Herrlich, MD;  Location: [facility] ORS;   Service: Orthopedics;  Laterality: Left;   RADIOLOGY WITH ANESTHESIA N/A 03/18/2020   Procedure: MRI WITH ANESTHESIA CERVICAL SPINE AND BRAIN  WITH AND WITHOUT CONTRAST;  Surgeon: Radiologist, Medication, MD;  Location: [facility] OR;  Service: Radiology;  Laterality: N/A;   TUBAL LIGATION Bilateral    VAGINAL HYSTERECTOMY  03/2006    There were no vitals filed for this visit.   Subjective Assessment - 10/06/21 1158     Subjective Patient reports her dentist appointment went well. Is feeling good today.    Pertinent History Patient is a pleasant 61 year old female who is returning to PT after MS exacerbation and hospitalization for COVID. Patient went to SNF for one month and then received home health PT for a month (8 visits).  PMH includes HTN, SVT, neurogenic bowel, MS (1995), neuropathy, hx of wrist fx, HPV, Hypercholesteremia, adiposity, osteopenia, spasticity. She wears bilateral AFOs and uses a RW and/or wheelchair for mobility. She drives an adapted car.    Limitations Lifting;Standing;Walking;House hold activities;Sitting    How long can you sit comfortably? n/a    How long can you stand comfortably? 2 minutes    How long can you walk comfortably? 40 ft in house    Patient Stated Goals to return to PLOF prior to hospitalization    Currently  in Pain? No/denies                    INTERVENTIONS:    Therex   Standing activities in // bars:   -standing  with lateral weight shifting  with only 1 UE support x 15 reps each  -lateral step 10x each LE  --modified squat 5x; 2 sets -Standing with chest press using PVC x 10 reps - Standing with attempted overhead press x 10 reps with PVC pipe   Seated activities in wheelchair     Sitting on dynadisc: -anterior/posterior weight shift 15x -medial/lateral weight shift 15x each side -hand clap for coordination and pertubation's x 3 minutes -modified windmill 10x each side  BTB abduction 10x BTB adduction 10x each LE     Education provided throughout session via VC/TC and demonstration to facilitate movement at target joints and correct muscle activation for all testing and exercises performed   Patient tolerates physical therapy session well with improved tolerance of modified squats. She continues to rely heavily on Ue's for squat but is able to tolerate increased repetitions. Core stabilization continues to progress at this time. The pt will benefit from further skilled PT to improve mobility, strength, pain, gait and balance in order to decrease fall risk and increase QOL                  PT Education - 10/06/21 1005     Education provided Yes    Education Details exercise technique, body mechanics    Person(s) Educated Patient    Methods Explanation;Demonstration;Tactile cues;Verbal cues    Comprehension Verbalized understanding;Returned demonstration;Verbal cues required;Tactile cues required              PT Short Term Goals - 05/05/21 1253       PT SHORT TERM GOAL #1   Title Patient will be independent in home exercise program to improve strength/mobility for better functional independence with ADLs.    Baseline 10/4 HEP provided 11/8: HEP compliant    Time 4    Period Weeks    Status Achieved    Target Date 03/08/21               PT Long Term Goals - 09/29/21 1513       PT LONG TERM GOAL #1   Title Patient will increase FOTO score to equal to or greater than  51/100   to demonstrate statistically significant improvement in mobility and quality of life.    Baseline 10/4: 42% 11/8: 36% 12/29: 45%; 06/09/2021= 36% 39: 36%    Time 12    Period Weeks    Status On-going    Target Date 10/18/21                   Plan - 10/06/21 1157     Clinical Impression Statement Patient tolerates physical therapy session well with improved tolerance of modified squats. She continues to rely heavily on Ue's for squat but is able to tolerate increased repetitions. Core  stabilization continues to progress at this time. The pt will benefit from further skilled PT to improve mobility, strength, pain, gait and balance in order to decrease fall risk and increase QOL    Personal Factors and Comorbidities Age;Comorbidity 3+;Finances;Fitness;Past/Current Experience;Sex;Social Background;Time since onset of injury/illness/exacerbation;Transportation    Comorbidities HTN, SVT, neurogenic bowel, MS (1995), neuropathy, hx of wrist fx, HPV, Hypercholesteremia, adiposity, osteopenia, spasticity.    Examination-Activity Limitations Bathing;Bed Mobility;Bend;Carry;Continence;Dressing;Hygiene/Grooming;Stairs;Squat;Reach Overhead;Locomotion Level;Lift;Stand;Transfers;Toileting  Examination-Participation Restrictions Church;Cleaning;Community Activity;Laundry;Volunteer;Shop;Meal Prep;Yard Work    Conservation officer, historic buildings Evolving/Moderate complexity    Rehab Potential Good    PT Frequency 2x / week    PT Duration 12 weeks    PT Treatment/Interventions ADLs/Self Care Home Management;Aquatic Therapy;Biofeedback;Cryotherapy;Electrical Stimulation;Iontophoresis /ml Dexamethasone;Traction;Moist Heat;Ultrasound;DME Instruction;Gait training;Stair training;Functional mobility training;Therapeutic activities;Therapeutic exercise;Balance training;Neuromuscular re-education;Patient/family education;Orthotic Fit/Training;Wheelchair mobility training;Manual techniques;Compression bandaging;Passive range of motion;Dry needling;Energy conservation;Taping;Splinting;Vestibular    PT Next Visit Plan in // bars weight shift and LE strengthening    PT Home Exercise Plan No changes    Consulted and Agree with Plan of Care Patient             Patient will benefit from skilled therapeutic intervention in order to improve the following deficits and impairments:  Abnormal gait, Decreased activity tolerance, Decreased balance, Decreased coordination, Decreased endurance, Decreased  mobility, Decreased range of motion, Decreased strength, Difficulty walking, Impaired perceived functional ability, Impaired sensation, Impaired tone, Impaired UE functional use, Improper body mechanics, Postural dysfunction, Cardiopulmonary status limiting activity, Impaired flexibility, Increased muscle spasms  Visit Diagnosis: Abnormality of gait and mobility  Other abnormalities of gait and mobility  Muscle weakness (generalized)  Unsteadiness on feet     Problem List Patient Active Problem List   Diagnosis Date Noted   Acute respiratory disease due to COVID-19 virus 11/21/2020   Weakness    Hypoalbuminemia due to protein-calorie malnutrition (HCC)    Neurogenic bowel    Neurogenic bladder    Labile blood pressure    Neuropathic pain    Abscess of female pelvis    SVT (supraventricular tachycardia) (HCC)    Radial styloid tenosynovitis 03/12/2018   Wheelchair confinement 02/27/2018   Localized osteoporosis with current pathological fracture with routine healing 01/19/2017   Wrist fracture 01/16/2017   Sprain of ankle 03/23/2016   Closed fracture of lateral malleolus 03/16/2016   Health care maintenance 01/24/2016   Blood pressure elevated without history of HTN 10/25/2015   Essential hypertension 10/25/2015   Multiple sclerosis (HCC) 10/02/2015   Chronic left shoulder pain 07/19/2015   Multiple sclerosis exacerbation (HCC) 07/14/2015   MS (multiple sclerosis) (HCC) 11/26/2014   Increased body mass index 11/26/2014   HPV test positive 11/26/2014   Status post laparoscopic supracervical hysterectomy 11/26/2014   Galactorrhea 11/26/2014   Back ache 05/21/2014   Adiposity 05/21/2014   Disordered sleep 05/21/2014   Muscle spasticity 05/21/2014   Spasticity 05/21/2014   Calculus of kidney 12/09/2013   Renal colic 12/09/2013   Hypercholesteremia 08/19/2013   Hereditary and idiopathic neuropathy 08/19/2013   Hypercholesterolemia without hypertriglyceridemia 08/19/2013    Bladder infection, chronic 07/25/2012   Disorder of bladder function 07/25/2012   Incomplete bladder emptying 07/25/2012   Microscopic hematuria 07/25/2012   Right upper quadrant pain 07/25/2012    Precious Bard, PT, DPT  10/06/2021, 11:59 AM  [place] [facility] MAIN [facility] SERVICES [address] [place], [place], [address] Phone: [phone]   Fax:  [phone]  Name: Lisa Williams MRN: [id number] Date of Birth: [birth year]

## 2021-10-11 ENCOUNTER — Ambulatory Visit: Payer: PPO

## 2021-10-11 DIAGNOSIS — R2681 Unsteadiness on feet: Secondary | ICD-10-CM

## 2021-10-11 DIAGNOSIS — R269 Unspecified abnormalities of gait and mobility: Secondary | ICD-10-CM

## 2021-10-11 DIAGNOSIS — M6281 Muscle weakness (generalized): Secondary | ICD-10-CM

## 2021-10-11 DIAGNOSIS — R2689 Other abnormalities of gait and mobility: Secondary | ICD-10-CM

## 2021-10-11 NOTE — Therapy (Signed)
[facility] MAIN [facility] SERVICES [address] [place], [place], [address] Phone: [phone]   Fax:  [phone]  Physical Therapy Treatment  Patient Details  Name: Lisa Williams MRN: [id number] Date of Birth: [birth year] Referring Provider (PT): Stephanie Acre MD   Encounter Date: 10/11/2021   PT End of Session - 10/11/21 1159     Visit Number 64    Number of Visits 67    Date for PT Re-Evaluation 10/18/21    PT Start Time 1015    PT Stop Time 1100    PT Time Calculation (min) 45 min    Equipment Utilized During Treatment Gait belt    Activity Tolerance Patient tolerated treatment well;Patient limited by fatigue    Behavior During Therapy [facility] for tasks assessed/performed             Past Medical History:  Diagnosis Date   Abdominal pain, right upper quadrant    Back pain    Calculus of kidney 12/09/2013   Chronic back pain    unspecified   Chronic left shoulder pain 07/19/2015   Complication of anesthesia    Functional disorder of bladder    other   Galactorrhea 11/26/2014   Chronic    Hereditary and idiopathic neuropathy 08/19/2013   HPV test positive    Hypercholesteremia 08/19/2013   Hypertension    Incomplete bladder emptying    Microscopic hematuria    MS (multiple sclerosis) (HCC)    Muscle spasticity 05/21/2014   Nonspecific findings on examination of urine    other   Osteopenia    PONV (postoperative nausea and vomiting)    Status post laparoscopic supracervical hysterectomy 11/26/2014   Tobacco user 11/26/2014   Wrist fracture     Past Surgical History:  Procedure Laterality Date   bilateral tubal ligation  1996   BREAST CYST EXCISION Left 2002   FRACTURE SURGERY     KNEE SURGERY     right   LAPAROSCOPIC SUPRACERVICAL HYSTERECTOMY  08/05/2013   ORIF WRIST FRACTURE Left 01/17/2017   Procedure: OPEN REDUCTION INTERNAL FIXATION (ORIF) WRIST FRACTURE;  Surgeon: Lyndle Herrlich, MD;  Location: [facility] ORS;   Service: Orthopedics;  Laterality: Left;   RADIOLOGY WITH ANESTHESIA N/A 03/18/2020   Procedure: MRI WITH ANESTHESIA CERVICAL SPINE AND BRAIN  WITH AND WITHOUT CONTRAST;  Surgeon: Radiologist, Medication, MD;  Location: [facility] OR;  Service: Radiology;  Laterality: N/A;   TUBAL LIGATION Bilateral    VAGINAL HYSTERECTOMY  03/2006    There were no vitals filed for this visit.   Subjective Assessment - 10/11/21 1157     Subjective Patient reports feeling good today, is a little tired since she didn't sleep well.    Pertinent History Patient is a pleasant 61 year old female who is returning to PT after MS exacerbation and hospitalization for COVID. Patient went to SNF for one month and then received home health PT for a month (8 visits).  PMH includes HTN, SVT, neurogenic bowel, MS (1995), neuropathy, hx of wrist fx, HPV, Hypercholesteremia, adiposity, osteopenia, spasticity. She wears bilateral AFOs and uses a RW and/or wheelchair for mobility. She drives an adapted car.    Limitations Lifting;Standing;Walking;House hold activities;Sitting    How long can you sit comfortably? n/a    How long can you stand comfortably? 2 minutes    How long can you walk comfortably? 40 ft in house    Patient Stated Goals to return to PLOF prior to hospitalization  Currently in Pain? No/denies             Ambulation:    Patient 120 feet with 1 seated rest break.  She utilized bariatric wheeled walker with wheelchair follow and close contact-guard assist.  Patient is able to self monitor fatigue and request breaks as needed.  Seated Green Thera-Band overhead 12 times Green Thera-Band punches around the back 10 times each arm Green Thera-Band ER 12 times cues for keeping elbows to body Green Thera-Band elbow cues for body mechanics 12 times  Pt educated throughout session about proper posture and technique with exercises. Improved exercise technique, movement at target joints, use of target muscles after min  to mod verbal, visual, tactile cues. Rationale for Evaluation and Treatment Rehabilitation  Patient tolerated ambulation and strengthening interventions well.  She is able to self monitor fatigue and request for rest breaks as needed.  Patient aware that Reser is upcoming and will address goals in upcoming sessions.The pt will benefit from further skilled PT to improve mobility, strength, pain, gait and balance in order to decrease fall risk and increase QOL                          PT Education - 10/11/21 1157     Education provided Yes    Education Details exercise technique, body mechanics    Person(s) Educated Patient    Methods Explanation;Demonstration;Tactile cues;Verbal cues    Comprehension Verbalized understanding;Returned demonstration;Verbal cues required;Tactile cues required              PT Short Term Goals - 05/05/21 1253       PT SHORT TERM GOAL #1   Title Patient will be independent in home exercise program to improve strength/mobility for better functional independence with ADLs.    Baseline 10/4 HEP provided 11/8: HEP compliant    Time 4    Period Weeks    Status Achieved    Target Date 03/08/21               PT Long Term Goals - 09/29/21 1513       PT LONG TERM GOAL #1   Title Patient will increase FOTO score to equal to or greater than  51/100   to demonstrate statistically significant improvement in mobility and quality of life.    Baseline 10/4: 42% 11/8: 36% 12/29: 45%; 06/09/2021= 36% 39: 36%    Time 12    Period Weeks    Status On-going    Target Date 10/18/21                   Plan - 10/11/21 1203     Clinical Impression Statement Patient tolerated ambulation and strengthening interventions well.  She is able to self monitor fatigue and request for rest breaks as needed.  Patient aware that Reser is upcoming and will address goals in upcoming sessions.The pt will benefit from further skilled PT to improve  mobility, strength, pain, gait and balance in order to decrease fall risk and increase QOL    Personal Factors and Comorbidities Age;Comorbidity 3+;Finances;Fitness;Past/Current Experience;Sex;Social Background;Time since onset of injury/illness/exacerbation;Transportation    Comorbidities HTN, SVT, neurogenic bowel, MS (1995), neuropathy, hx of wrist fx, HPV, Hypercholesteremia, adiposity, osteopenia, spasticity.    Examination-Activity Limitations Bathing;Bed Mobility;Bend;Carry;Continence;Dressing;Hygiene/Grooming;Stairs;Squat;Reach Overhead;Locomotion Level;Lift;Stand;Transfers;Toileting    Examination-Participation Restrictions Church;Cleaning;Community Activity;Laundry;Volunteer;Shop;Meal Prep;Yard Work    Stability/Clinical Decision Making Evolving/Moderate complexity    Rehab Potential Good    PT Frequency  2x / week    PT Duration 12 weeks    PT Treatment/Interventions ADLs/Self Care Home Management;Aquatic Therapy;Biofeedback;Cryotherapy;Electrical Stimulation;Iontophoresis 4mg /ml Dexamethasone;Traction;Moist Heat;Ultrasound;DME Instruction;Gait training;Stair training;Functional mobility training;Therapeutic activities;Therapeutic exercise;Balance training;Neuromuscular re-education;Patient/family education;Orthotic Fit/Training;Wheelchair mobility training;Manual techniques;Compression bandaging;Passive range of motion;Dry needling;Energy conservation;Taping;Splinting;Vestibular    PT Next Visit Plan in // bars weight shift and LE strengthening    PT Home Exercise Plan No changes    Consulted and Agree with Plan of Care Patient             Patient will benefit from skilled therapeutic intervention in order to improve the following deficits and impairments:  Abnormal gait, Decreased activity tolerance, Decreased balance, Decreased coordination, Decreased endurance, Decreased mobility, Decreased range of motion, Decreased strength, Difficulty walking, Impaired perceived functional  ability, Impaired sensation, Impaired tone, Impaired UE functional use, Improper body mechanics, Postural dysfunction, Cardiopulmonary status limiting activity, Impaired flexibility, Increased muscle spasms  Visit Diagnosis: Abnormality of gait and mobility  Other abnormalities of gait and mobility  Muscle weakness (generalized)  Unsteadiness on feet     Problem List Patient Active Problem List   Diagnosis Date Noted   Acute respiratory disease due to COVID-19 virus 11/21/2020   Weakness    Hypoalbuminemia due to protein-calorie malnutrition (HCC)    Neurogenic bowel    Neurogenic bladder    Labile blood pressure    Neuropathic pain    Abscess of female pelvis    SVT (supraventricular tachycardia) (HCC)    Radial styloid tenosynovitis 03/12/2018   Wheelchair confinement 02/27/2018   Localized osteoporosis with current pathological fracture with routine healing 01/19/2017   Wrist fracture 01/16/2017   Sprain of ankle 03/23/2016   Closed fracture of lateral malleolus 03/16/2016   Health care maintenance 01/24/2016   Blood pressure elevated without history of HTN 10/25/2015   Essential hypertension 10/25/2015   Multiple sclerosis (HCC) 10/02/2015   Chronic left shoulder pain 07/19/2015   Multiple sclerosis exacerbation (HCC) 07/14/2015   MS (multiple sclerosis) (HCC) 11/26/2014   Increased body mass index 11/26/2014   HPV test positive 11/26/2014   Status post laparoscopic supracervical hysterectomy 11/26/2014   Galactorrhea 11/26/2014   Back ache 05/21/2014   Adiposity 05/21/2014   Disordered sleep 05/21/2014   Muscle spasticity 05/21/2014   Spasticity 05/21/2014   Calculus of kidney 12/09/2013   Renal colic 12/09/2013   Hypercholesteremia 08/19/2013   Hereditary and idiopathic neuropathy 08/19/2013   Hypercholesterolemia without hypertriglyceridemia 08/19/2013   Bladder infection, chronic 07/25/2012   Disorder of bladder function 07/25/2012   Incomplete bladder  emptying 07/25/2012   Microscopic hematuria 07/25/2012   Right upper quadrant pain 07/25/2012    07/27/2012, PT, DPT  10/11/2021, 12:04 PM  [facility] MAIN [facility] SERVICES [address] [place], [place], [place] Phone: [phone]   Fax:  [phone]  Name: Ibeth Fahmy Balthazor MRN: Delsa Sale Date of Birth: Oct 11, 1960

## 2021-10-13 ENCOUNTER — Ambulatory Visit: Payer: PPO

## 2021-10-13 DIAGNOSIS — R269 Unspecified abnormalities of gait and mobility: Secondary | ICD-10-CM | POA: Diagnosis not present

## 2021-10-13 DIAGNOSIS — R2681 Unsteadiness on feet: Secondary | ICD-10-CM

## 2021-10-13 DIAGNOSIS — R2689 Other abnormalities of gait and mobility: Secondary | ICD-10-CM

## 2021-10-13 DIAGNOSIS — M6281 Muscle weakness (generalized): Secondary | ICD-10-CM

## 2021-10-13 NOTE — Therapy (Signed)
[place] MAIN [facility] SERVICES [place], [place], [address] Phone: [phone]   Fax:  [phone]  Physical Therapy Treatment  Patient Details  Name: Lisa Williams MRN: [id number] Date of Birth: 1960/08/19 Referring Provider (PT): Margette Fast MD   Encounter Date: 10/13/2021   PT End of Session - 10/13/21 1007     Visit Number 65    Number of Visits 35    Date for PT Re-Evaluation 10/18/21    PT Start Time 8588    PT Stop Time 1059    PT Time Calculation (min) 44 min    Equipment Utilized During Treatment Gait belt    Activity Tolerance Patient tolerated treatment well;Patient limited by fatigue    Behavior During Therapy [facility] for tasks assessed/performed             Past Medical History:  Diagnosis Date   Abdominal pain, right upper quadrant    Back pain    Calculus of kidney 12/09/2013   Chronic back pain    unspecified   Chronic left shoulder pain 09/07/7739   Complication of anesthesia    Functional disorder of bladder    other   Galactorrhea 11/26/2014   Chronic    Hereditary and idiopathic neuropathy 08/19/2013   HPV test positive    Hypercholesteremia 08/19/2013   Hypertension    Incomplete bladder emptying    Microscopic hematuria    MS (multiple sclerosis) (HCC)    Muscle spasticity 05/21/2014   Nonspecific findings on examination of urine    other   Osteopenia    PONV (postoperative nausea and vomiting)    Status post laparoscopic supracervical hysterectomy 11/26/2014   Tobacco user 11/26/2014   Wrist fracture     Past Surgical History:  Procedure Laterality Date   bilateral tubal ligation  1996   BREAST CYST EXCISION Left 2002   FRACTURE SURGERY     KNEE SURGERY     right   LAPAROSCOPIC SUPRACERVICAL HYSTERECTOMY  08/05/2013   ORIF WRIST FRACTURE Left 01/17/2017   Procedure: OPEN REDUCTION INTERNAL FIXATION (ORIF) WRIST FRACTURE;  Surgeon: Lovell Sheehan, MD;  Location: [facility] ORS;   Service: Orthopedics;  Laterality: Left;   RADIOLOGY WITH ANESTHESIA N/A 03/18/2020   Procedure: MRI WITH ANESTHESIA CERVICAL SPINE AND BRAIN  WITH AND WITHOUT CONTRAST;  Surgeon: Radiologist, Medication, MD;  Location: [facility];  Service: Radiology;  Laterality: N/A;   TUBAL LIGATION Bilateral    VAGINAL HYSTERECTOMY  03/2006    There were no vitals filed for this visit.   Subjective Assessment - 10/13/21 1148     Subjective Patient is aware next session will be recert on her birthday. No falls or LOB.    Pertinent History Patient is a pleasant 61 year old female who is returning to PT after MS exacerbation and hospitalization for COVID. Patient went to SNF for one month and then received home health PT for a month (8 visits).  PMH includes HTN, SVT, neurogenic bowel, MS (1995), neuropathy, hx of wrist fx, HPV, Hypercholesteremia, adiposity, osteopenia, spasticity. She wears bilateral AFOs and uses a RW and/or wheelchair for mobility. She drives an adapted car.    Limitations Lifting;Standing;Walking;House hold activities;Sitting    How long can you sit comfortably? n/a    How long can you stand comfortably? 2 minutes    How long can you walk comfortably? 33 ft in house    Patient Stated Goals to return to PLOF prior to hospitalization  Currently in Pain? No/denies              5x STS: 22 seconds first trial 22.6 seconds second trial 10 MWT: first trial 42 seconds ; 45 seconds second trial   Seated boxing: cues for sequencing, body mechanics, and pace/rhythm for functional contraction and timing of muscle recruitment: -Cross body punches to mitts on PT hands with no back support for focused core stabilization with crossing midline 2x 60 seconds  -Cross body punch with secondary combination of elbow for full cross body rotation to PT mitt for trunk stability, coordination, and cardiovascular challenge x 60 seconds -Upper cut to mitts in PT hands for core activation without back  support with perturbations 60 seconds   hamstring lengthening stretch 60 seconds each LE  x2 trials  Pt educated throughout session about proper posture and technique with exercises. Improved exercise technique, movement at target joints, use of target muscles after min to mod verbal, visual, tactile cues. Rationale for Evaluation and Treatment Rehabilitation   Patient improved her sit to stand goal to 22 seconds indicating improved ability to transfer. She continues to be highly motivated for progression of mobility and independence. Seated boxing challenges her coordination and muscular endurance. The pt will benefit from further skilled PT to improve mobility, strength, pain, gait and balance in order to decrease fall risk and increase QOL                       PT Education - 10/13/21 1005     Education provided Yes    Education Details goals, body mechanics    Person(s) Educated Patient    Methods Explanation;Demonstration;Tactile cues;Verbal cues    Comprehension Verbalized understanding;Returned demonstration;Verbal cues required;Tactile cues required              PT Short Term Goals - 05/05/21 1253       PT SHORT TERM GOAL #1   Title Patient will be independent in home exercise program to improve strength/mobility for better functional independence with ADLs.    Baseline 10/4 HEP provided 11/8: HEP compliant    Time 4    Period Weeks    Status Achieved    Target Date 03/08/21               PT Long Term Goals - 10/13/21 0001       PT LONG TERM GOAL #1   Title Patient will increase FOTO score to equal to or greater than  51/100   to demonstrate statistically significant improvement in mobility and quality of life.    Baseline 10/4: 42% 11/8: 36% 12/29: 45%; 06/09/2021= 36% 39: 36%    Time 12    Period Weeks    Status On-going    Target Date 10/18/21      PT LONG TERM GOAL #2   Title Patient (< 40 years old) will complete five times sit to  stand test in < 20 seconds indicating an increased LE strength and improved balance.    Baseline 10/4: 33.15 seconds with heavy BUE support 11/8: 30 seconds; 12/29: deferred to future session. Pt attempted today but unable to complete d/t increase in R side LBP; 06/09/2021= 27.72 sec with UE support 3/9: 37.74 seconds 3/21: 29 seconds with BUE support. 09/27/2021- Did not assess secondary to recent report of increased MM spasms- Will attempt to address next visit if appropriate. 6/8: 22.6 seconds    Time 12    Period Weeks  Status Partially Met    Target Date 10/18/21      PT LONG TERM GOAL #3   Title Patient will increase 10 meter walk test to <20 seconds as to improve gait speed for better community ambulation and to reduce fall risk.    Baseline 10/4: 43.17 seconds with RW 11/8: 40.8 seconds; 43 sec (0.23 m/s) with RW; 06/09/2021= 40.7 sec with RW 3/9: 38.8 seconds. 09/27/2021- DId not assess secondary to patient recent c/o of mm spasms- will attempt to address next visit.    Time 12    Period Weeks    Status Partially Met    Target Date 10/18/21      PT LONG TERM GOAL #4   Title Patient will reduce timed up and go to <11 seconds to reduce fall risk and demonstrate improved transfer/gait ability.    Baseline 10/4: 52.66 seconds with RW 11/8: 49 seconds with RW; 12/29: 44.3 sec; 06/09/2021= 46.45 sec with use of RW 3/9: 39.9. 09/27/2021- Did not assess due to recent mm spams- will attempt to reassess next visit if appropriate.    Time 12    Period Weeks    Status Partially Met    Target Date 10/18/21      PT LONG TERM GOAL #5   Title Patient will transfer from her shower seat independently for safety with ADLs.    Baseline 3/21: unable    Time 12    Period Weeks    Status New    Target Date 10/18/21      Additional Long Term Goals   Additional Long Term Goals Yes      PT LONG TERM GOAL #6   Title Patient will get into bed without assistance to increase independence with functional  mobility.    Baseline 3/21: needs assistance from sister    Time 12    Period Weeks    Status New    Target Date 10/18/21                   Plan - 10/13/21 1149     Clinical Impression Statement Patient improved her sit to stand goal to 22 seconds indicating improved ability to transfer. She continues to be highly motivated for progression of mobility and independence. Seated boxing challenges her coordination and muscular endurance. The pt will benefit from further skilled PT to improve mobility, strength, pain, gait and balance in order to decrease fall risk and increase QOL    Personal Factors and Comorbidities Age;Comorbidity 3+;Finances;Fitness;Past/Current Experience;Sex;Social Background;Time since onset of injury/illness/exacerbation;Transportation    Comorbidities HTN, SVT, neurogenic bowel, MS (1995), neuropathy, hx of wrist fx, HPV, Hypercholesteremia, adiposity, osteopenia, spasticity.    Examination-Activity Limitations Bathing;Bed Mobility;Bend;Carry;Continence;Dressing;Hygiene/Grooming;Stairs;Squat;Reach Overhead;Locomotion Level;Lift;Stand;Transfers;Toileting    Examination-Participation Restrictions Church;Cleaning;Community Activity;Laundry;Volunteer;Shop;Meal Prep;Yard Work    [employer] Evolving/Moderate complexity    Rehab Potential Good    PT Frequency 2x / week    PT Duration 12 weeks    PT Treatment/Interventions ADLs/Self Care Home Management;Aquatic Therapy;Biofeedback;Cryotherapy;Electrical Stimulation;Iontophoresis 81m/ml Dexamethasone;Traction;Moist Heat;Ultrasound;DME Instruction;Gait training;Stair training;Functional mobility training;Therapeutic activities;Therapeutic exercise;Balance training;Neuromuscular re-education;Patient/family education;Orthotic Fit/Training;Wheelchair mobility training;Manual techniques;Compression bandaging;Passive range of motion;Dry needling;Energy conservation;Taping;Splinting;Vestibular    PT Next  Visit Plan in // bars weight shift and LE strengthening    PT Home Exercise Plan No changes    Consulted and Agree with Plan of Care Patient             Patient will benefit from skilled therapeutic intervention in order to improve the following  deficits and impairments:  Abnormal gait, Decreased activity tolerance, Decreased balance, Decreased coordination, Decreased endurance, Decreased mobility, Decreased range of motion, Decreased strength, Difficulty walking, Impaired perceived functional ability, Impaired sensation, Impaired tone, Impaired UE functional use, Improper body mechanics, Postural dysfunction, Cardiopulmonary status limiting activity, Impaired flexibility, Increased muscle spasms  Visit Diagnosis: Abnormality of gait and mobility  Other abnormalities of gait and mobility  Muscle weakness (generalized)  Unsteadiness on feet     Problem List Patient Active Problem List   Diagnosis Date Noted   Acute respiratory disease due to COVID-19 virus 11/21/2020   Weakness    Hypoalbuminemia due to protein-calorie malnutrition (HCC)    Neurogenic bowel    Neurogenic bladder    Labile blood pressure    Neuropathic pain    Abscess of female pelvis    SVT (supraventricular tachycardia) (HCC)    Radial styloid tenosynovitis 03/12/2018   Wheelchair confinement 02/27/2018   Localized osteoporosis with current pathological fracture with routine healing 01/19/2017   Wrist fracture 01/16/2017   Sprain of ankle 03/23/2016   Closed fracture of lateral malleolus 03/16/2016   Health care maintenance 01/24/2016   Blood pressure elevated without history of HTN 10/25/2015   Essential hypertension 10/25/2015   Multiple sclerosis ([place]) 10/02/2015   Chronic left shoulder pain 07/19/2015   Multiple sclerosis exacerbation ([place]) 07/14/2015   MS (multiple sclerosis) ([place]) 11/26/2014   Increased body mass index 11/26/2014   HPV test positive 11/26/2014   Status post laparoscopic  supracervical hysterectomy 11/26/2014   Galactorrhea 11/26/2014   Back ache 05/21/2014   Adiposity 05/21/2014   Disordered sleep 05/21/2014   Muscle spasticity 05/21/2014   Spasticity 05/21/2014   Calculus of kidney 15/37/9432   Renal colic 76/14/7092   Hypercholesteremia 08/19/2013   Hereditary and idiopathic neuropathy 08/19/2013   Hypercholesterolemia without hypertriglyceridemia 08/19/2013   Bladder infection, chronic 07/25/2012   Disorder of bladder function 07/25/2012   Incomplete bladder emptying 07/25/2012   Microscopic hematuria 07/25/2012   Right upper quadrant pain 07/25/2012   Janna Arch, PT, DPT  10/13/2021, 11:50 AM  [place], [place], [address] Phone: [phone]   Fax:  [phone]  Name: Lisa Williams MRN: [id number] Date of Birth: 04-02-1961

## 2021-10-18 ENCOUNTER — Encounter: Payer: Self-pay | Admitting: Neurology

## 2021-10-18 ENCOUNTER — Ambulatory Visit: Payer: PPO

## 2021-10-18 DIAGNOSIS — R2689 Other abnormalities of gait and mobility: Secondary | ICD-10-CM

## 2021-10-18 DIAGNOSIS — R269 Unspecified abnormalities of gait and mobility: Secondary | ICD-10-CM | POA: Diagnosis not present

## 2021-10-18 DIAGNOSIS — M6281 Muscle weakness (generalized): Secondary | ICD-10-CM

## 2021-10-18 NOTE — Therapy (Signed)
[name] MAIN [facility] SERVICES [place], [place], [address] Phone: [phone]   Fax:  [phone]  Physical Therapy Treatment  Patient Details  Name: Lisa Williams MRN: [id number] Date of Birth: 1960-06-28 Referring Provider (PT): Margette Fast MD   Encounter Date: 10/18/2021   PT End of Session - 10/18/21 1210     Visit Number 66    Number of Visits 90    Date for PT Re-Evaluation 01/10/22    PT Start Time 4008    PT Stop Time 1059    PT Time Calculation (min) 44 min    Equipment Utilized During Treatment Gait belt    Activity Tolerance Patient tolerated treatment well;Patient limited by fatigue    Behavior During Therapy [facility] for tasks assessed/performed             Past Medical History:  Diagnosis Date   Abdominal pain, right upper quadrant    Back pain    Calculus of kidney 12/09/2013   Chronic back pain    unspecified   Chronic left shoulder pain 6/76/1950   Complication of anesthesia    Functional disorder of bladder    other   Galactorrhea 11/26/2014   Chronic    Hereditary and idiopathic neuropathy 08/19/2013   HPV test positive    Hypercholesteremia 08/19/2013   Hypertension    Incomplete bladder emptying    Microscopic hematuria    MS (multiple sclerosis) (HCC)    Muscle spasticity 05/21/2014   Nonspecific findings on examination of urine    other   Osteopenia    PONV (postoperative nausea and vomiting)    Status post laparoscopic supracervical hysterectomy 11/26/2014   Tobacco user 11/26/2014   Wrist fracture     Past Surgical History:  Procedure Laterality Date   bilateral tubal ligation  1996   BREAST CYST EXCISION Left 2002   FRACTURE SURGERY     KNEE SURGERY     right   LAPAROSCOPIC SUPRACERVICAL HYSTERECTOMY  08/05/2013   ORIF WRIST FRACTURE Left 01/17/2017   Procedure: OPEN REDUCTION INTERNAL FIXATION (ORIF) WRIST FRACTURE;  Surgeon: Lovell Sheehan, MD;  Location: [facility];   Service: Orthopedics;  Laterality: Left;   RADIOLOGY WITH ANESTHESIA N/A 03/18/2020   Procedure: MRI WITH ANESTHESIA CERVICAL SPINE AND BRAIN  WITH AND WITHOUT CONTRAST;  Surgeon: Radiologist, Medication, MD;  Location: [place];  Service: Radiology;  Laterality: N/A;   TUBAL LIGATION Bilateral    VAGINAL HYSTERECTOMY  03/2006    There were no vitals filed for this visit.   Subjective Assessment - 10/18/21 1150     Subjective Patient reorts over the weekend she started having numbness in her leg. She was unable to stand in the morning during the weekend to get dressed and has to ask for assistance. Has not had this happen since last MS flare up.    Pertinent History Patient is a pleasant 61 year old female who is returning to PT after MS exacerbation and hospitalization for COVID. Patient went to SNF for one month and then received home health PT for a month (8 visits).  PMH includes HTN, SVT, neurogenic bowel, MS (1995), neuropathy, hx of wrist fx, HPV, Hypercholesteremia, adiposity, osteopenia, spasticity. She wears bilateral AFOs and uses a RW and/or wheelchair for mobility. She drives an adapted car.    Limitations Lifting;Standing;Walking;House hold activities;Sitting    How long can you sit comfortably? n/a    How long can you stand comfortably? 2  minutes    How long can you walk comfortably? 40 ft in house    Patient Stated Goals to return to PLOF prior to hospitalization    Currently in Pain? No/denies              Patient reports her right leg has been going numb in lower posterior region all the way to her toes.       RECERT 10 MWT: 38 seconds  TUG: 42 seconds  FOTO: 45%  Shower seat: has not attempted yet  Get into bed: needs min A from sister, independent getting out of bed.   Treatment GTB : -overhead raise 12x -ER neutral grip 12x -IR neutral grip 12x -row 12x -D2 patterning 12x each side -around the back bicep punches 12x each UE -around bilateral knees  abduction 12x -single leg adduction 12x each LE   Pt educated throughout session about proper posture and technique with exercises. Improved exercise technique, movement at target joints, use of target muscles after min to mod verbal, visual, tactile cues. Rationale for Evaluation and Treatment Rehabilitation  Patient is able to completely goals despite having increased numbness and feeling off. Patient encouraged to follow up with physician due to possibility of MS flare up. Patient has continuous progressive trend over progress notes during last recert indicating improved mobility and stability to improve independence. Her recent symptoms indicate possible flare up and are not indicative of her general progression. The pt will benefit from further skilled PT to improve mobility, strength, pain, gait and balance in order to decrease fall risk and increase QOL                 PT Education - 10/18/21 1209     Education provided Yes    Education Details goals, POC, call physician about potential flare up    Person(s) Educated Patient    Methods Explanation;Demonstration;Tactile cues;Verbal cues    Comprehension Verbalized understanding;Returned demonstration;Verbal cues required;Tactile cues required              PT Short Term Goals - 05/05/21 1253       PT SHORT TERM GOAL #1   Title Patient will be independent in home exercise program to improve strength/mobility for better functional independence with ADLs.    Baseline 10/4 HEP provided 11/8: HEP compliant    Time 4    Period Weeks    Status Achieved    Target Date 03/08/21               PT Long Term Goals - 10/18/21 0001       PT LONG TERM GOAL #1   Title Patient will increase FOTO score to equal to or greater than  51/100   to demonstrate statistically significant improvement in mobility and quality of life.    Baseline 10/4: 42% 11/8: 36% 12/29: 45%; 06/09/2021= 36% 39: 36% 6/13: 45%    Time 12    Period  Weeks    Status On-going    Target Date 01/10/22      PT LONG TERM GOAL #2   Title Patient (< 4 years old) will complete five times sit to stand test in < 20 seconds indicating an increased LE strength and improved balance.    Baseline 10/4: 33.15 seconds with heavy BUE support 11/8: 30 seconds; 12/29: deferred to future session. Pt attempted today but unable to complete d/t increase in R side LBP; 06/09/2021= 27.72 sec with UE support 3/9: 37.74 seconds 3/21: 29  seconds with BUE support. 09/27/2021- Did not assess secondary to recent report of increased MM spasms- Will attempt to address next visit if appropriate. 6/8: 22.6 seconds    Time 12    Period Weeks    Status Partially Met    Target Date 01/10/22      PT LONG TERM GOAL #3   Title Patient will increase 10 meter walk test to <20 seconds as to improve gait speed for better community ambulation and to reduce fall risk.    Baseline 10/4: 43.17 seconds with RW 11/8: 40.8 seconds; 43 sec (0.23 m/s) with RW; 06/09/2021= 40.7 sec with RW 3/9: 38.8 seconds. 09/27/2021- DId not assess secondary to patient recent c/o of mm spasms- will attempt to address next visit. 6/13: 38 seconds    Time 12    Period Weeks    Status Partially Met    Target Date 01/10/22      PT LONG TERM GOAL #4   Title Patient will reduce timed up and go to <11 seconds to reduce fall risk and demonstrate improved transfer/gait ability.    Baseline 10/4: 52.66 seconds with RW 11/8: 49 seconds with RW; 12/29: 44.3 sec; 06/09/2021= 46.45 sec with use of RW 3/9: 39.9. 09/27/2021- Did not assess due to recent mm spams- will attempt to reassess next visit if appropriate. 6/13: 42 seconds    Time 12    Period Weeks    Status Partially Met    Target Date 01/10/22      PT LONG TERM GOAL #5   Title Patient will transfer from her shower seat independently for safety with ADLs.    Baseline 3/21: unable 6/13: has not attempted    Time 12    Period Weeks    Status On-going    Target  Date 01/10/22      PT LONG TERM GOAL #6   Title Patient will get into bed without assistance to increase independence with functional mobility.    Baseline 3/21: needs assistance from sister 6/13; able to exit bed independently, needs min A for LE's from sister to get into bed.    Time 12    Period Weeks    Status Partially Met    Target Date 01/10/22                   Plan - 10/18/21 1214     Clinical Impression Statement Patient is able to completely goals despite having increased numbness and feeling off. Patient encouraged to follow up with physician due to possibility of MS flare up. Patient has continuous progressive trend over progress notes during last recert indicating improved mobility and stability to improve independence. Her recent symptoms indicate possible flare up and are not indicative of her general progression. The pt will benefit from further skilled PT to improve mobility, strength, pain, gait and balance in order to decrease fall risk and increase QOL    Personal Factors and Comorbidities Age;Comorbidity 3+;Finances;Fitness;Past/Current Experience;Sex;Social Background;Time since onset of injury/illness/exacerbation;Transportation    Comorbidities HTN, SVT, neurogenic bowel, MS (1995), neuropathy, hx of wrist fx, HPV, Hypercholesteremia, adiposity, osteopenia, spasticity.    Examination-Activity Limitations Bathing;Bed Mobility;Bend;Carry;Continence;Dressing;Hygiene/Grooming;Stairs;Squat;Reach Overhead;Locomotion Level;Lift;Stand;Transfers;Toileting    Examination-Participation Restrictions Church;Cleaning;Community Activity;Laundry;Volunteer;Shop;Meal Prep;Yard Work    [employer] Evolving/Moderate complexity    Rehab Potential Good    PT Frequency 2x / week    PT Duration 12 weeks    PT Treatment/Interventions ADLs/Self Care Home Management;Aquatic Therapy;Biofeedback;Cryotherapy;Electrical Stimulation;Iontophoresis 51m/ml  Dexamethasone;Traction;Moist Heat;Ultrasound;DME Instruction;Gait training;Stair  training;Functional mobility training;Therapeutic activities;Therapeutic exercise;Balance training;Neuromuscular re-education;Patient/family education;Orthotic Fit/Training;Wheelchair mobility training;Manual techniques;Compression bandaging;Passive range of motion;Dry needling;Energy conservation;Taping;Splinting;Vestibular    PT Next Visit Plan in // bars weight shift and LE strengthening    PT Home Exercise Plan No changes    Consulted and Agree with Plan of Care Patient             Patient will benefit from skilled therapeutic intervention in order to improve the following deficits and impairments:  Abnormal gait, Decreased activity tolerance, Decreased balance, Decreased coordination, Decreased endurance, Decreased mobility, Decreased range of motion, Decreased strength, Difficulty walking, Impaired perceived functional ability, Impaired sensation, Impaired tone, Impaired UE functional use, Improper body mechanics, Postural dysfunction, Cardiopulmonary status limiting activity, Impaired flexibility, Increased muscle spasms  Visit Diagnosis: Abnormality of gait and mobility  Other abnormalities of gait and mobility  Muscle weakness (generalized)     Problem List Patient Active Problem List   Diagnosis Date Noted   Acute respiratory disease due to COVID-19 virus 11/21/2020   Weakness    Hypoalbuminemia due to protein-calorie malnutrition (HCC)    Neurogenic bowel    Neurogenic bladder    Labile blood pressure    Neuropathic pain    Abscess of female pelvis    SVT (supraventricular tachycardia) (HCC)    Radial styloid tenosynovitis 03/12/2018   Wheelchair confinement 02/27/2018   Localized osteoporosis with current pathological fracture with routine healing 01/19/2017   Wrist fracture 01/16/2017   Sprain of ankle 03/23/2016   Closed fracture of lateral malleolus 03/16/2016   Health care  maintenance 01/24/2016   Blood pressure elevated without history of HTN 10/25/2015   Essential hypertension 10/25/2015   Multiple sclerosis ([place]) 10/02/2015   Chronic left shoulder pain 07/19/2015   Multiple sclerosis exacerbation ([place]) 07/14/2015   MS (multiple sclerosis) ([place]) 11/26/2014   Increased body mass index 11/26/2014   HPV test positive 11/26/2014   Status post laparoscopic supracervical hysterectomy 11/26/2014   Galactorrhea 11/26/2014   Back ache 05/21/2014   Adiposity 05/21/2014   Disordered sleep 05/21/2014   Muscle spasticity 05/21/2014   Spasticity 05/21/2014   Calculus of kidney 70/96/2836   Renal colic 62/94/7654   Hypercholesteremia 08/19/2013   Hereditary and idiopathic neuropathy 08/19/2013   Hypercholesterolemia without hypertriglyceridemia 08/19/2013   Bladder infection, chronic 07/25/2012   Disorder of bladder function 07/25/2012   Incomplete bladder emptying 07/25/2012   Microscopic hematuria 07/25/2012   Right upper quadrant pain 07/25/2012    Janna Arch, PT, DPT  10/18/2021, 12:20 PM  [place] [facility] MAIN [facility] SERVICES [address] [place], [place], [address] Phone: [phone]   Fax:  [phone]  Name: Lisa Williams MRN: [id number] Date of Birth: [birth year]

## 2021-10-19 ENCOUNTER — Other Ambulatory Visit: Payer: Self-pay | Admitting: *Deleted

## 2021-10-19 ENCOUNTER — Telehealth: Payer: Self-pay | Admitting: Neurology

## 2021-10-19 DIAGNOSIS — G35 Multiple sclerosis: Secondary | ICD-10-CM

## 2021-10-19 MED ORDER — PREDNISONE 50 MG PO TABS: 600.0000 mg | ORAL_TABLET | Freq: Every day | ORAL | 0 refills | Status: AC

## 2021-10-19 NOTE — Telephone Encounter (Signed)
Pt would like a call from the nurse to discuss dosage of predniSONE (DELTASONE) 50 MG tablet. Want to make sure not a tapered down dosage.

## 2021-10-19 NOTE — Telephone Encounter (Signed)
Called the patient and advised her that it is meant for her to that high amount for 3 days. She just wanted to verify since she had not done that before. Pt verbalized understanding.

## 2021-10-20 ENCOUNTER — Ambulatory Visit: Payer: PPO

## 2021-10-20 DIAGNOSIS — R2681 Unsteadiness on feet: Secondary | ICD-10-CM

## 2021-10-20 DIAGNOSIS — R269 Unspecified abnormalities of gait and mobility: Secondary | ICD-10-CM

## 2021-10-20 DIAGNOSIS — M6281 Muscle weakness (generalized): Secondary | ICD-10-CM

## 2021-10-20 DIAGNOSIS — R2689 Other abnormalities of gait and mobility: Secondary | ICD-10-CM

## 2021-10-20 NOTE — Therapy (Signed)
[place] MAIN [facility] SERVICES [place], [place], [address] Phone: [phone]   Fax:  [phone]  Physical Therapy Treatment  Patient Details  Name: Lisa Williams MRN: [id number] Date of Birth: [birth year] Referring Provider (PT): Margette Fast MD   Encounter Date: 10/20/2021   PT End of Session - 10/20/21 1443     Visit Number 28    Number of Visits 90    Date for PT Re-Evaluation 01/10/22    PT Start Time 4650    PT Stop Time 1059    PT Time Calculation (min) 44 min    Equipment Utilized During Treatment Gait belt    Activity Tolerance Patient tolerated treatment well;Patient limited by fatigue    Behavior During Therapy [facility] for tasks assessed/performed             Past Medical History:  Diagnosis Date   Abdominal pain, right upper quadrant    Back pain    Calculus of kidney 12/09/2013   Chronic back pain    unspecified   Chronic left shoulder pain 3/54/6568   Complication of anesthesia    Functional disorder of bladder    other   Galactorrhea 11/26/2014   Chronic    Hereditary and idiopathic neuropathy 08/19/2013   HPV test positive    Hypercholesteremia 08/19/2013   Hypertension    Incomplete bladder emptying    Microscopic hematuria    MS (multiple sclerosis) (HCC)    Muscle spasticity 05/21/2014   Nonspecific findings on examination of urine    other   Osteopenia    PONV (postoperative nausea and vomiting)    Status post laparoscopic supracervical hysterectomy 11/26/2014   Tobacco user 11/26/2014   Wrist fracture     Past Surgical History:  Procedure Laterality Date   bilateral tubal ligation  1996   BREAST CYST EXCISION Left 2002   FRACTURE SURGERY     KNEE SURGERY     right   LAPAROSCOPIC SUPRACERVICAL HYSTERECTOMY  08/05/2013   ORIF WRIST FRACTURE Left 01/17/2017   Procedure: OPEN REDUCTION INTERNAL FIXATION (ORIF) WRIST FRACTURE;  Surgeon: Lovell Sheehan, MD;  Location: [facility];   Service: Orthopedics;  Laterality: Left;   RADIOLOGY WITH ANESTHESIA N/A 03/18/2020   Procedure: MRI WITH ANESTHESIA CERVICAL SPINE AND BRAIN  WITH AND WITHOUT CONTRAST;  Surgeon: Radiologist, Medication, MD;  Location: [place];  Service: Radiology;  Laterality: N/A;   TUBAL LIGATION Bilateral    VAGINAL HYSTERECTOMY  03/2006    There were no vitals filed for this visit.   Subjective Assessment - 10/20/21 1442     Subjective Patient has started taking 50 mg of an antibiotic per phsyician request after she contacted doctor about recent flare up symptoms.    Pertinent History Patient is a pleasant 61 year old female who is returning to PT after MS exacerbation and hospitalization for COVID. Patient went to SNF for one month and then received home health PT for a month (8 visits).  PMH includes HTN, SVT, neurogenic bowel, MS (1995), neuropathy, hx of wrist fx, HPV, Hypercholesteremia, adiposity, osteopenia, spasticity. She wears bilateral AFOs and uses a RW and/or wheelchair for mobility. She drives an adapted car.    Limitations Lifting;Standing;Walking;House hold activities;Sitting    How long can you sit comfortably? n/a    How long can you stand comfortably? 2 minutes    How long can you walk comfortably? 40 ft in house    Patient Stated Goals to  return to PLOF prior to hospitalization    Currently in Pain? No/denies                  Standing in // bars: -terminated due to excessive tremors and fatigue   Seated: Seated on dynadisc: -static position 60 seconds x 2 trials -lateral weight shift x 10 each side -forward/backward weight shift x10; frequent near LOB -modified windmill 10x each side  Weighted ball (3000 gr) -bicep curl to overhead press 10x -chest press 10x  -D2 patterning 10x each side   GTB abduction 12x    Pt educated throughout session about proper posture and technique with exercises. Improved exercise technique, movement at target joints, use of target  muscles after min to mod verbal, visual, tactile cues. Rationale for Evaluation and Treatment Rehabilitation   Patient presents with increased weakness and instability. She is on antibiotics now. Patient fatigues quicker and is unable to find her Com as easily with increased sway. Patient requires increased rest breaks this session. She is aware to monitor her symptoms at this time. The pt will benefit from further skilled PT to improve mobility, strength, pain, gait and balance in order to decrease fall risk and increase QOL                    PT Education - 10/20/21 1443     Education provided Yes    Education Details exercise technique, body mechanics    Person(s) Educated Patient    Methods Explanation;Demonstration;Tactile cues;Verbal cues    Comprehension Verbalized understanding;Returned demonstration;Verbal cues required;Tactile cues required              PT Short Term Goals - 05/05/21 1253       PT SHORT TERM GOAL #1   Title Patient will be independent in home exercise program to improve strength/mobility for better functional independence with ADLs.    Baseline 10/4 HEP provided 11/8: HEP compliant    Time 4    Period Weeks    Status Achieved    Target Date 03/08/21               PT Long Term Goals - 10/18/21 0001       PT LONG TERM GOAL #1   Title Patient will increase FOTO score to equal to or greater than  51/100   to demonstrate statistically significant improvement in mobility and quality of life.    Baseline 10/4: 42% 11/8: 36% 12/29: 45%; 06/09/2021= 36% 39: 36% 6/13: 45%    Time 12    Period Weeks    Status On-going    Target Date 01/10/22      PT LONG TERM GOAL #2   Title Patient (< 59 years old) will complete five times sit to stand test in < 20 seconds indicating an increased LE strength and improved balance.    Baseline 10/4: 33.15 seconds with heavy BUE support 11/8: 30 seconds; 12/29: deferred to future session. Pt attempted  today but unable to complete d/t increase in R side LBP; 06/09/2021= 27.72 sec with UE support 3/9: 37.74 seconds 3/21: 29 seconds with BUE support. 09/27/2021- Did not assess secondary to recent report of increased MM spasms- Will attempt to address next visit if appropriate. 6/8: 22.6 seconds    Time 12    Period Weeks    Status Partially Met    Target Date 01/10/22      PT LONG TERM GOAL #3   Title Patient will increase  10 meter walk test to <20 seconds as to improve gait speed for better community ambulation and to reduce fall risk.    Baseline 10/4: 43.17 seconds with RW 11/8: 40.8 seconds; 43 sec (0.23 m/s) with RW; 06/09/2021= 40.7 sec with RW 3/9: 38.8 seconds. 09/27/2021- DId not assess secondary to patient recent c/o of mm spasms- will attempt to address next visit. 6/13: 38 seconds    Time 12    Period Weeks    Status Partially Met    Target Date 01/10/22      PT LONG TERM GOAL #4   Title Patient will reduce timed up and go to <11 seconds to reduce fall risk and demonstrate improved transfer/gait ability.    Baseline 10/4: 52.66 seconds with RW 11/8: 49 seconds with RW; 12/29: 44.3 sec; 06/09/2021= 46.45 sec with use of RW 3/9: 39.9. 09/27/2021- Did not assess due to recent mm spams- will attempt to reassess next visit if appropriate. 6/13: 42 seconds    Time 12    Period Weeks    Status Partially Met    Target Date 01/10/22      PT LONG TERM GOAL #5   Title Patient will transfer from her shower seat independently for safety with ADLs.    Baseline 3/21: unable 6/13: has not attempted    Time 12    Period Weeks    Status On-going    Target Date 01/10/22      PT LONG TERM GOAL #6   Title Patient will get into bed without assistance to increase independence with functional mobility.    Baseline 3/21: needs assistance from sister 6/13; able to exit bed independently, needs min A for LE's from sister to get into bed.    Time 12    Period Weeks    Status Partially Met    Target Date  01/10/22                   Plan - 10/20/21 1444     Clinical Impression Statement Patient presents with increased weakness and instability. She is on antibiotics now. Patient fatigues quicker and is unable to find her Com as easily with increased sway. Patient requires increased rest breaks this session. She is aware to monitor her symptoms at this time. The pt will benefit from further skilled PT to improve mobility, strength, pain, gait and balance in order to decrease fall risk and increase QOL    Personal Factors and Comorbidities Age;Comorbidity 3+;Finances;Fitness;Past/Current Experience;Sex;Social Background;Time since onset of injury/illness/exacerbation;Transportation    Comorbidities HTN, SVT, neurogenic bowel, MS (1995), neuropathy, hx of wrist fx, HPV, Hypercholesteremia, adiposity, osteopenia, spasticity.    Examination-Activity Limitations Bathing;Bed Mobility;Bend;Carry;Continence;Dressing;Hygiene/Grooming;Stairs;Squat;Reach Overhead;Locomotion Level;Lift;Stand;Transfers;Toileting    Examination-Participation Restrictions Church;Cleaning;Community Activity;Laundry;Volunteer;Shop;Meal Prep;Yard Work    Merchant [employer] officer Evolving/Moderate complexity    Rehab Potential Good    PT Frequency 2x / week    PT Duration 12 weeks    PT Treatment/Interventions ADLs/Self Care Home Management;Aquatic Therapy;Biofeedback;Cryotherapy;Electrical Stimulation;Iontophoresis 62m/ml Dexamethasone;Traction;Moist Heat;Ultrasound;DME Instruction;Gait training;Stair training;Functional mobility training;Therapeutic activities;Therapeutic exercise;Balance training;Neuromuscular re-education;Patient/family education;Orthotic Fit/Training;Wheelchair mobility training;Manual techniques;Compression bandaging;Passive range of motion;Dry needling;Energy conservation;Taping;Splinting;Vestibular    PT Next Visit Plan in // bars weight shift and LE strengthening    PT Home Exercise Plan No  changes    Consulted and Agree with Plan of Care Patient             Patient will benefit from skilled therapeutic intervention in order to improve the following deficits and impairments:  Abnormal gait, Decreased activity tolerance, Decreased balance, Decreased coordination, Decreased endurance, Decreased mobility, Decreased range of motion, Decreased strength, Difficulty walking, Impaired perceived functional ability, Impaired sensation, Impaired tone, Impaired UE functional use, Improper body mechanics, Postural dysfunction, Cardiopulmonary status limiting activity, Impaired flexibility, Increased muscle spasms  Visit Diagnosis: Abnormality of gait and mobility  Other abnormalities of gait and mobility  Muscle weakness (generalized)  Unsteadiness on feet     Problem List Patient Active Problem List   Diagnosis Date Noted   Acute respiratory disease due to COVID-19 virus 11/21/2020   Weakness    Hypoalbuminemia due to protein-calorie malnutrition (HCC)    Neurogenic bowel    Neurogenic bladder    Labile blood pressure    Neuropathic pain    Abscess of female pelvis    SVT (supraventricular tachycardia) (HCC)    Radial styloid tenosynovitis 03/12/2018   Wheelchair confinement 02/27/2018   Localized osteoporosis with current pathological fracture with routine healing 01/19/2017   Wrist fracture 01/16/2017   Sprain of ankle 03/23/2016   Closed fracture of lateral malleolus 03/16/2016   Health care maintenance 01/24/2016   Blood pressure elevated without history of HTN 10/25/2015   Essential hypertension 10/25/2015   Multiple sclerosis ([place]) 10/02/2015   Chronic left shoulder pain 07/19/2015   Multiple sclerosis exacerbation ([place]) 07/14/2015   MS (multiple sclerosis) ([place]) 11/26/2014   Increased body mass index 11/26/2014   HPV test positive 11/26/2014   Status post laparoscopic supracervical hysterectomy 11/26/2014   Galactorrhea 11/26/2014   Back ache 05/21/2014    Adiposity 05/21/2014   Disordered sleep 05/21/2014   Muscle spasticity 05/21/2014   Spasticity 05/21/2014   Calculus of kidney 45/99/7741   Renal colic 42/39/5320   Hypercholesteremia 08/19/2013   Hereditary and idiopathic neuropathy 08/19/2013   Hypercholesterolemia without hypertriglyceridemia 08/19/2013   Bladder infection, chronic 07/25/2012   Disorder of bladder function 07/25/2012   Incomplete bladder emptying 07/25/2012   Microscopic hematuria 07/25/2012   Right upper quadrant pain 07/25/2012    Janna Arch, PT, DPT  10/20/2021, 2:45 PM  [place] MAIN [facility] SERVICES [address] [place], [place], [address] Phone: [phone]   Fax:  [phone]  Name: Lisa Williams MRN: [id number] Date of Birth: 1961/02/24

## 2021-10-25 ENCOUNTER — Ambulatory Visit: Payer: PPO

## 2021-10-25 DIAGNOSIS — M6281 Muscle weakness (generalized): Secondary | ICD-10-CM

## 2021-10-25 DIAGNOSIS — R269 Unspecified abnormalities of gait and mobility: Secondary | ICD-10-CM

## 2021-10-25 DIAGNOSIS — R2689 Other abnormalities of gait and mobility: Secondary | ICD-10-CM

## 2021-10-25 DIAGNOSIS — R2681 Unsteadiness on feet: Secondary | ICD-10-CM

## 2021-10-25 NOTE — Therapy (Addendum)
OUTPATIENT PHYSICAL THERAPY TREATMENT NOTE   Patient Name: Lisa Williams MRN: [id number] DOB:1960/11/12, 61 y.o., female Today's Date: 10/25/2021  PCP: Kirk Ruths MD  REFERRING PROVIDER: Britt Bottom MD   PT End of Session - 10/25/21 1306     Visit Number 70    Number of Visits 90    Date for PT Re-Evaluation 01/10/22    PT Start Time 1062    PT Stop Time 1059    PT Time Calculation (min) 44 min    Equipment Utilized During Treatment Gait belt    Activity Tolerance Patient tolerated treatment well;Patient limited by fatigue    Behavior During Therapy [facility] for tasks assessed/performed             Past Medical History:  Diagnosis Date   Abdominal pain, right upper quadrant    Back pain    Calculus of kidney 12/09/2013   Chronic back pain    unspecified   Chronic left shoulder pain 6/94/8546   Complication of anesthesia    Functional disorder of bladder    other   Galactorrhea 11/26/2014   Chronic    Hereditary and idiopathic neuropathy 08/19/2013   HPV test positive    Hypercholesteremia 08/19/2013   Hypertension    Incomplete bladder emptying    Microscopic hematuria    MS (multiple sclerosis) (HCC)    Muscle spasticity 05/21/2014   Nonspecific findings on examination of urine    other   Osteopenia    PONV (postoperative nausea and vomiting)    Status post laparoscopic supracervical hysterectomy 11/26/2014   Tobacco user 11/26/2014   Wrist fracture    Past Surgical History:  Procedure Laterality Date   bilateral tubal ligation  1996   BREAST CYST EXCISION Left 2002   FRACTURE SURGERY     KNEE SURGERY     right   LAPAROSCOPIC SUPRACERVICAL HYSTERECTOMY  08/05/2013   ORIF WRIST FRACTURE Left 01/17/2017   Procedure: OPEN REDUCTION INTERNAL FIXATION (ORIF) WRIST FRACTURE;  Surgeon: Lovell Sheehan, MD;  Location: [facility];  Service: Orthopedics;  Laterality: Left;   RADIOLOGY WITH ANESTHESIA N/A 03/18/2020   Procedure: MRI WITH ANESTHESIA  CERVICAL SPINE AND BRAIN  WITH AND WITHOUT CONTRAST;  Surgeon: Radiologist, Medication, MD;  Location: [place];  Service: Radiology;  Laterality: N/A;   TUBAL LIGATION Bilateral    VAGINAL HYSTERECTOMY  03/2006   Patient Active Problem List   Diagnosis Date Noted   Acute respiratory disease due to COVID-19 virus 11/21/2020   Weakness    Hypoalbuminemia due to protein-calorie malnutrition (HCC)    Neurogenic bowel    Neurogenic bladder    Labile blood pressure    Neuropathic pain    Abscess of female pelvis    SVT (supraventricular tachycardia) ([place])    Radial styloid tenosynovitis 03/12/2018   Wheelchair confinement 02/27/2018   Localized osteoporosis with current pathological fracture with routine healing 01/19/2017   Wrist fracture 01/16/2017   Sprain of ankle 03/23/2016   Closed fracture of lateral malleolus 03/16/2016   Health care maintenance 01/24/2016   Blood pressure elevated without history of HTN 10/25/2015   Essential hypertension 10/25/2015   Multiple sclerosis ([place]) 10/02/2015   Chronic left shoulder pain 07/19/2015   Multiple sclerosis exacerbation ([place]) 07/14/2015   MS (multiple sclerosis) ([place]) 11/26/2014   Increased body mass index 11/26/2014   HPV test positive 11/26/2014   Status post laparoscopic supracervical hysterectomy 11/26/2014   Galactorrhea 11/26/2014   Back ache 05/21/2014  Adiposity 05/21/2014   Disordered sleep 05/21/2014   Muscle spasticity 05/21/2014   Spasticity 05/21/2014   Calculus of kidney 44/81/8563   Renal colic 14/97/0263   Hypercholesteremia 08/19/2013   Hereditary and idiopathic neuropathy 08/19/2013   Hypercholesterolemia without hypertriglyceridemia 08/19/2013   Bladder infection, chronic 07/25/2012   Disorder of bladder function 07/25/2012   Incomplete bladder emptying 07/25/2012   Microscopic hematuria 07/25/2012   Right upper quadrant pain 07/25/2012    REFERRING DIAG: MS  THERAPY DIAG:  Abnormality of gait and  mobility  Other abnormalities of gait and mobility  Muscle weakness (generalized)  Unsteadiness on feet  Rationale for Evaluation and Treatment Rehabilitation  PERTINENT HISTORY: Patient is a pleasant 61 year old female who is returning to PT after MS exacerbation and hospitalization for COVID. Patient went to SNF for one month and then received home health PT for a month (8 visits). PMH includes HTN, SVT, neurogenic bowel, MS (1995), neuropathy, hx of wrist fx, HPV, Hypercholesteremia, adiposity, osteopenia, spasticity. She wears bilateral AFOs and uses a RW and/or wheelchair for mobility. She drives an adapted car  PRECAUTIONS: Fall  SUBJECTIVE: Patient presents she had a relaxed weekend to try to recover from her illness. Is feeling better but not 100% yet.   PAIN:  Are you having pain? No     TODAY'S TREATMENT:   Standing in // bars: Standing weight shifts with decreasing UE support x 2 minutes Standing PVC pipe chest press 10x Standing PVC pipe raise: terminated due to excessive fatigue    Seated: Seated on dynadisc: -static position 60 seconds x 2 trials -lateral weight shift x 10 each side -forward/backward weight shift x10; frequent near LOB -modified windmill 10x each side  GTB abduction 12x  GTB adduction 10x each LE GTB row 12x GTB around the back punches 10x each side.     Pt educated throughout session about proper posture and technique with exercises. Improved exercise technique, movement at target joints, use of target muscles after min to mod verbal, visual, tactile cues. Rationale for Evaluation and Treatment Rehabilitation     PATIENT EDUCATION: Education details: Economist, strengthening Person educated: Patient Education method: Explanation, Demonstration, Tactile cues, and Verbal cues Education comprehension: verbalized understanding, returned demonstration, verbal cues required, and tactile cues required   HOME EXERCISE  PROGRAM: Seated: adduction ball squeeze 10x, seated on dynadisc 30 seconds, sit to stand 10x, seated abduction 10x.    Plan - 10/25/21 1331     Clinical Impression Statement Patient presents with excellent motivation throughout physical therapy session. She continues to be recovering at this time and is not yet at full strength requiring increased rest breaks. Use of ice to reduce body temperature continues to be required. The pt will benefit from further skilled PT to improve mobility, strength, pain, gait and balance in order to decrease fall risk and increase QOL    Personal Factors and Comorbidities Age;Comorbidity 3+;Finances;Fitness;Past/Current Experience;Sex;Social Background;Time since onset of injury/illness/exacerbation;Transportation    Comorbidities HTN, SVT, neurogenic bowel, MS (1995), neuropathy, hx of wrist fx, HPV, Hypercholesteremia, adiposity, osteopenia, spasticity.    Examination-Activity Limitations Bathing;Bed Mobility;Bend;Carry;Continence;Dressing;Hygiene/Grooming;Stairs;Squat;Reach Overhead;Locomotion Level;Lift;Stand;Transfers;Toileting    Examination-Participation Restrictions Church;Cleaning;Community Activity;Laundry;Volunteer;Shop;Meal Prep;Yard Work    Merchant [employer] officer Evolving/Moderate complexity    Rehab Potential Good    PT Frequency 2x / week    PT Duration 12 weeks    PT Treatment/Interventions ADLs/Self Care Home Management;Aquatic Therapy;Biofeedback;Cryotherapy;Electrical Stimulation;Iontophoresis 23m/ml Dexamethasone;Traction;Moist Heat;Ultrasound;DME Instruction;Gait training;Stair training;Functional mobility training;Therapeutic activities;Therapeutic exercise;Balance training;Neuromuscular re-education;Patient/family education;Orthotic Fit/Training;Wheelchair mobility  training;Manual techniques;Compression bandaging;Passive range of motion;Dry needling;Energy conservation;Taping;Splinting;Vestibular    PT Next Visit Plan in // bars weight  shift and LE strengthening    PT Home Exercise Plan No changes    Consulted and Agree with Plan of Care Patient              PT Short Term Goals - 05/05/21 1253       PT SHORT TERM GOAL #1   Title Patient will be independent in home exercise program to improve strength/mobility for better functional independence with ADLs.    Baseline 10/4 HEP provided 11/8: HEP compliant    Time 4    Period Weeks    Status Achieved    Target Date 03/08/21              PT Long Term Goals - 10/18/21 0001       PT LONG TERM GOAL #1   Title Patient will increase FOTO score to equal to or greater than  51/100   to demonstrate statistically significant improvement in mobility and quality of life.    Baseline 10/4: 42% 11/8: 36% 12/29: 45%; 06/09/2021= 36% 39: 36% 6/13: 45%    Time 12    Period Weeks    Status On-going    Target Date 01/10/22      PT LONG TERM GOAL #2   Title Patient (< 60 years old) will complete five times sit to stand test in < 20 seconds indicating an increased LE strength and improved balance.    Baseline 10/4: 33.15 seconds with heavy BUE support 11/8: 30 seconds; 12/29: deferred to future session. Pt attempted today but unable to complete d/t increase in R side LBP; 06/09/2021= 27.72 sec with UE support 3/9: 37.74 seconds 3/21: 29 seconds with BUE support. 09/27/2021- Did not assess secondary to recent report of increased MM spasms- Will attempt to address next visit if appropriate. 6/8: 22.6 seconds    Time 12    Period Weeks    Status Partially Met    Target Date 01/10/22      PT LONG TERM GOAL #3   Title Patient will increase 10 meter walk test to <20 seconds as to improve gait speed for better community ambulation and to reduce fall risk.    Baseline 10/4: 43.17 seconds with RW 11/8: 40.8 seconds; 43 sec (0.23 m/s) with RW; 06/09/2021= 40.7 sec with RW 3/9: 38.8 seconds. 09/27/2021- DId not assess secondary to patient recent c/o of mm spasms- will attempt to address next  visit. 6/13: 38 seconds    Time 12    Period Weeks    Status Partially Met    Target Date 01/10/22      PT LONG TERM GOAL #4   Title Patient will reduce timed up and go to <11 seconds to reduce fall risk and demonstrate improved transfer/gait ability.    Baseline 10/4: 52.66 seconds with RW 11/8: 49 seconds with RW; 12/29: 44.3 sec; 06/09/2021= 46.45 sec with use of RW 3/9: 39.9. 09/27/2021- Did not assess due to recent mm spams- will attempt to reassess next visit if appropriate. 6/13: 42 seconds    Time 12    Period Weeks    Status Partially Met    Target Date 01/10/22      PT LONG TERM GOAL #5   Title Patient will transfer from her shower seat independently for safety with ADLs.    Baseline 3/21: unable 6/13: has not attempted    Time 12  Period Weeks    Status On-going    Target Date 01/10/22      PT LONG TERM GOAL #6   Title Patient will get into bed without assistance to increase independence with functional mobility.    Baseline 3/21: needs assistance from sister 6/13; able to exit bed independently, needs min A for LE's from sister to get into bed.    Time 12    Period Weeks    Status Partially Met    Target Date 01/10/22                Janna Arch, PT, DPT 10/25/2021, 1:38 PM

## 2021-10-26 NOTE — Therapy (Signed)
OUTPATIENT PHYSICAL THERAPY TREATMENT NOTE   Patient Name: Lisa Williams MRN: [id number] DOB:1961/04/10, 61 y.o., female Today's Date: 10/27/2021  PCP: Kirk Ruths MD  REFERRING PROVIDER: Britt Bottom MD   PT End of Session - 10/27/21 1232     Visit Number 39    Number of Visits 90    Date for PT Re-Evaluation 01/10/22    PT Start Time 1015    PT Stop Time 1059    PT Time Calculation (min) 44 min    Equipment Utilized During Treatment Gait belt    Activity Tolerance Patient tolerated treatment well;Patient limited by fatigue    Behavior During Therapy [facility] for tasks assessed/performed              Past Medical History:  Diagnosis Date   Abdominal pain, right upper quadrant    Back pain    Calculus of kidney 12/09/2013   Chronic back pain    unspecified   Chronic left shoulder pain 1/74/0814   Complication of anesthesia    Functional disorder of bladder    other   Galactorrhea 11/26/2014   Chronic    Hereditary and idiopathic neuropathy 08/19/2013   HPV test positive    Hypercholesteremia 08/19/2013   Hypertension    Incomplete bladder emptying    Microscopic hematuria    MS (multiple sclerosis) (HCC)    Muscle spasticity 05/21/2014   Nonspecific findings on examination of urine    other   Osteopenia    PONV (postoperative nausea and vomiting)    Status post laparoscopic supracervical hysterectomy 11/26/2014   Tobacco user 11/26/2014   Wrist fracture    Past Surgical History:  Procedure Laterality Date   bilateral tubal ligation  1996   BREAST CYST EXCISION Left 2002   FRACTURE SURGERY     KNEE SURGERY     right   LAPAROSCOPIC SUPRACERVICAL HYSTERECTOMY  08/05/2013   ORIF WRIST FRACTURE Left 01/17/2017   Procedure: OPEN REDUCTION INTERNAL FIXATION (ORIF) WRIST FRACTURE;  Surgeon: Lovell Sheehan, MD;  Location: [facility];  Service: Orthopedics;  Laterality: Left;   RADIOLOGY WITH ANESTHESIA N/A 03/18/2020   Procedure: MRI WITH ANESTHESIA  CERVICAL SPINE AND BRAIN  WITH AND WITHOUT CONTRAST;  Surgeon: Radiologist, Medication, MD;  Location: [facility];  Service: Radiology;  Laterality: N/A;   TUBAL LIGATION Bilateral    VAGINAL HYSTERECTOMY  03/2006   Patient Active Problem List   Diagnosis Date Noted   Acute respiratory disease due to COVID-19 virus 11/21/2020   Weakness    Hypoalbuminemia due to protein-calorie malnutrition (HCC)    Neurogenic bowel    Neurogenic bladder    Labile blood pressure    Neuropathic pain    Abscess of female pelvis    SVT (supraventricular tachycardia) ([place])    Radial styloid tenosynovitis 03/12/2018   Wheelchair confinement 02/27/2018   Localized osteoporosis with current pathological fracture with routine healing 01/19/2017   Wrist fracture 01/16/2017   Sprain of ankle 03/23/2016   Closed fracture of lateral malleolus 03/16/2016   Health care maintenance 01/24/2016   Blood pressure elevated without history of HTN 10/25/2015   Essential hypertension 10/25/2015   Multiple sclerosis ([place]) 10/02/2015   Chronic left shoulder pain 07/19/2015   Multiple sclerosis exacerbation ([place]) 07/14/2015   MS (multiple sclerosis) ([place]) 11/26/2014   Increased body mass index 11/26/2014   HPV test positive 11/26/2014   Status post laparoscopic supracervical hysterectomy 11/26/2014   Galactorrhea 11/26/2014   Back ache 05/21/2014  Adiposity 05/21/2014   Disordered sleep 05/21/2014   Muscle spasticity 05/21/2014   Spasticity 05/21/2014   Calculus of kidney 32/54/9826   Renal colic 41/58/3094   Hypercholesteremia 08/19/2013   Hereditary and idiopathic neuropathy 08/19/2013   Hypercholesterolemia without hypertriglyceridemia 08/19/2013   Bladder infection, chronic 07/25/2012   Disorder of bladder function 07/25/2012   Incomplete bladder emptying 07/25/2012   Microscopic hematuria 07/25/2012   Right upper quadrant pain 07/25/2012    REFERRING DIAG: MS  THERAPY DIAG:  Abnormality of gait and  mobility  Other abnormalities of gait and mobility  Muscle weakness (generalized)  Unsteadiness on feet  Rationale for Evaluation and Treatment Rehabilitation  PERTINENT HISTORY: Patient is a pleasant 61 year old female who is returning to PT after MS exacerbation and hospitalization for COVID. Patient went to SNF for one month and then received home health PT for a month (8 visits). PMH includes HTN, SVT, neurogenic bowel, MS (1995), neuropathy, hx of wrist fx, HPV, Hypercholesteremia, adiposity, osteopenia, spasticity. She wears bilateral AFOs and uses a RW and/or wheelchair for mobility. She drives an adapted car  PRECAUTIONS: Fall  SUBJECTIVE: Patient reports she is doing well, no falls or LOB since last session.   PAIN:  Are you having pain? No     TODAY'S TREATMENT:   10/27/21 Ambulation:    Patient 120 feet with 1 seated rest break.  She utilized bariatric wheeled walker with wheelchair follow and close contact-guard assist.  Patient is able to self monitor fatigue and request breaks as needed.  Seated: 5lb bar: -chest press 12x -witches brew 10 clockwise, 10x counterclockwise -overhead press 12x -tricep extension 12x each UE -bicep curl 12x     PATIENT EDUCATION: Education details: Economist, strengthening Person educated: Patient Education method: Explanation, Demonstration, Tactile cues, and Verbal cues Education comprehension: verbalized understanding, returned demonstration, verbal cues required, and tactile cues required   HOME EXERCISE PROGRAM: Seated: adduction ball squeeze 10x, seated on dynadisc 30 seconds, sit to stand 10x, seated abduction 10x.         PT Short Term Goals - 05/05/21 1253       PT SHORT TERM GOAL #1   Title Patient will be independent in home exercise program to improve strength/mobility for better functional independence with ADLs.    Baseline 10/4 HEP provided 11/8: HEP compliant    Time 4    Period Weeks    Status  Achieved    Target Date 03/08/21              PT Long Term Goals - 10/18/21 0001       PT LONG TERM GOAL #1   Title Patient will increase FOTO score to equal to or greater than  51/100   to demonstrate statistically significant improvement in mobility and quality of life.    Baseline 10/4: 42% 11/8: 36% 12/29: 45%; 06/09/2021= 36% 39: 36% 6/13: 45%    Time 12    Period Weeks    Status On-going    Target Date 01/10/22      PT LONG TERM GOAL #2   Title Patient (< 82 years old) will complete five times sit to stand test in < 20 seconds indicating an increased LE strength and improved balance.    Baseline 10/4: 33.15 seconds with heavy BUE support 11/8: 30 seconds; 12/29: deferred to future session. Pt attempted today but unable to complete d/t increase in R side LBP; 06/09/2021= 27.72 sec with UE support 3/9: 37.74 seconds 3/21:  29 seconds with BUE support. 09/27/2021- Did not assess secondary to recent report of increased MM spasms- Will attempt to address next visit if appropriate. 6/8: 22.6 seconds    Time 12    Period Weeks    Status Partially Met    Target Date 01/10/22      PT LONG TERM GOAL #3   Title Patient will increase 10 meter walk test to <20 seconds as to improve gait speed for better community ambulation and to reduce fall risk.    Baseline 10/4: 43.17 seconds with RW 11/8: 40.8 seconds; 43 sec (0.23 m/s) with RW; 06/09/2021= 40.7 sec with RW 3/9: 38.8 seconds. 09/27/2021- DId not assess secondary to patient recent c/o of mm spasms- will attempt to address next visit. 6/13: 38 seconds    Time 12    Period Weeks    Status Partially Met    Target Date 01/10/22      PT LONG TERM GOAL #4   Title Patient will reduce timed up and go to <11 seconds to reduce fall risk and demonstrate improved transfer/gait ability.    Baseline 10/4: 52.66 seconds with RW 11/8: 49 seconds with RW; 12/29: 44.3 sec; 06/09/2021= 46.45 sec with use of RW 3/9: 39.9. 09/27/2021- Did not assess due to recent  mm spams- will attempt to reassess next visit if appropriate. 6/13: 42 seconds    Time 12    Period Weeks    Status Partially Met    Target Date 01/10/22      PT LONG TERM GOAL #5   Title Patient will transfer from her shower seat independently for safety with ADLs.    Baseline 3/21: unable 6/13: has not attempted    Time 12    Period Weeks    Status On-going    Target Date 01/10/22      PT LONG TERM GOAL #6   Title Patient will get into bed without assistance to increase independence with functional mobility.    Baseline 3/21: needs assistance from sister 6/13; able to exit bed independently, needs min A for LE's from sister to get into bed.    Time 12    Period Weeks    Status Partially Met    Target Date 01/10/22             Plan     Clinical Impression Statement Patient is able to ambulate today with one rest break required. She is slowly regaining her strength from her minor relapse last week. Her strength is not yet back to prior to exacerbation but is slowly returning. Patient remains highly motivated throughout session. The pt will benefit from further skilled PT to improve mobility, strength, pain, gait and balance in order to decrease fall risk and increase QOL    Personal Factors and Comorbidities Age;Comorbidity 3+;Finances;Fitness;Past/Current Experience;Sex;Social Background;Time since onset of injury/illness/exacerbation;Transportation    Comorbidities HTN, SVT, neurogenic bowel, MS (1995), neuropathy, hx of wrist fx, HPV, Hypercholesteremia, adiposity, osteopenia, spasticity.    Examination-Activity Limitations Bathing;Bed Mobility;Bend;Carry;Continence;Dressing;Hygiene/Grooming;Stairs;Squat;Reach Overhead;Locomotion Level;Lift;Stand;Transfers;Toileting    Examination-Participation Restrictions Church;Cleaning;Community Activity;Laundry;Volunteer;Shop;Meal Prep;Yard Work    [employer]chant [employer] officer Evolving/Moderate complexity    Rehab Potential Good     PT Frequency 2x / week    PT Duration 12 weeks    PT Treatment/Interventions ADLs/Self Care Home Management;Aquatic Therapy;Biofeedback;Cryotherapy;Electrical Stimulation;Iontophoresis 81m/ml Dexamethasone;Traction;Moist Heat;Ultrasound;DME Instruction;Gait training;Stair training;Functional mobility training;Therapeutic activities;Therapeutic exercise;Balance training;Neuromuscular re-education;Patient/family education;Orthotic Fit/Training;Wheelchair mobility training;Manual techniques;Compression bandaging;Passive range of motion;Dry needling;Energy conservation;Taping;Splinting;Vestibular    PT Next Visit Plan in //  bars weight shift and LE strengthening    PT Home Exercise Plan No changes    Consulted and Agree with Plan of Care Patient              Janna Arch, PT, DPT 10/27/2021, 1:05 PM

## 2021-10-27 ENCOUNTER — Ambulatory Visit: Payer: PPO

## 2021-10-27 DIAGNOSIS — R269 Unspecified abnormalities of gait and mobility: Secondary | ICD-10-CM

## 2021-10-27 DIAGNOSIS — M6281 Muscle weakness (generalized): Secondary | ICD-10-CM

## 2021-10-27 DIAGNOSIS — R2681 Unsteadiness on feet: Secondary | ICD-10-CM

## 2021-10-27 DIAGNOSIS — R2689 Other abnormalities of gait and mobility: Secondary | ICD-10-CM

## 2021-11-01 ENCOUNTER — Ambulatory Visit: Payer: PPO

## 2021-11-01 DIAGNOSIS — R2689 Other abnormalities of gait and mobility: Secondary | ICD-10-CM

## 2021-11-01 DIAGNOSIS — R269 Unspecified abnormalities of gait and mobility: Secondary | ICD-10-CM | POA: Diagnosis not present

## 2021-11-01 DIAGNOSIS — R2681 Unsteadiness on feet: Secondary | ICD-10-CM

## 2021-11-01 DIAGNOSIS — M6281 Muscle weakness (generalized): Secondary | ICD-10-CM

## 2021-11-03 ENCOUNTER — Ambulatory Visit: Payer: PPO

## 2021-11-03 DIAGNOSIS — G35 Multiple sclerosis: Secondary | ICD-10-CM

## 2021-11-03 DIAGNOSIS — R262 Difficulty in walking, not elsewhere classified: Secondary | ICD-10-CM

## 2021-11-03 DIAGNOSIS — R278 Other lack of coordination: Secondary | ICD-10-CM

## 2021-11-03 DIAGNOSIS — R2689 Other abnormalities of gait and mobility: Secondary | ICD-10-CM

## 2021-11-03 DIAGNOSIS — R2681 Unsteadiness on feet: Secondary | ICD-10-CM

## 2021-11-03 DIAGNOSIS — M5412 Radiculopathy, cervical region: Secondary | ICD-10-CM

## 2021-11-03 DIAGNOSIS — R269 Unspecified abnormalities of gait and mobility: Secondary | ICD-10-CM | POA: Diagnosis not present

## 2021-11-03 DIAGNOSIS — M6281 Muscle weakness (generalized): Secondary | ICD-10-CM

## 2021-11-03 NOTE — Therapy (Signed)
OUTPATIENT PHYSICAL THERAPY TREATMENT NOTE    Patient Name: Lisa Williams MRN: [id number] DOB:1961-02-02, 61 y.o., female Today's Date: 11/04/2021  PCP: Kirk Ruths MD  REFERRING PROVIDER: Britt Bottom MD       Past Medical History:  Diagnosis Date   Abdominal pain, right upper quadrant    Back pain    Calculus of kidney 12/09/2013   Chronic back pain    unspecified   Chronic left shoulder pain 3/00/9233   Complication of anesthesia    Functional disorder of bladder    other   Galactorrhea 11/26/2014   Chronic    Hereditary and idiopathic neuropathy 08/19/2013   HPV test positive    Hypercholesteremia 08/19/2013   Hypertension    Incomplete bladder emptying    Microscopic hematuria    MS (multiple sclerosis) (HCC)    Muscle spasticity 05/21/2014   Nonspecific findings on examination of urine    other   Osteopenia    PONV (postoperative nausea and vomiting)    Status post laparoscopic supracervical hysterectomy 11/26/2014   Tobacco user 11/26/2014   Wrist fracture    Past Surgical History:  Procedure Laterality Date   bilateral tubal ligation  1996   BREAST CYST EXCISION Left 2002   FRACTURE SURGERY     KNEE SURGERY     right   LAPAROSCOPIC SUPRACERVICAL HYSTERECTOMY  08/05/2013   ORIF WRIST FRACTURE Left 01/17/2017   Procedure: OPEN REDUCTION INTERNAL FIXATION (ORIF) WRIST FRACTURE;  Surgeon: Lovell Sheehan, MD;  Location: [facility];  Service: Orthopedics;  Laterality: Left;   RADIOLOGY WITH ANESTHESIA N/A 03/18/2020   Procedure: MRI WITH ANESTHESIA CERVICAL SPINE AND BRAIN  WITH AND WITHOUT CONTRAST;  Surgeon: Radiologist, Medication, MD;  Location: [facility];  Service: Radiology;  Laterality: N/A;   TUBAL LIGATION Bilateral    VAGINAL HYSTERECTOMY  03/2006   Patient Active Problem List   Diagnosis Date Noted   Acute respiratory disease due to COVID-19 virus 11/21/2020   Weakness    Hypoalbuminemia due to protein-calorie malnutrition (HCC)     Neurogenic bowel    Neurogenic bladder    Labile blood pressure    Neuropathic pain    Abscess of female pelvis    SVT (supraventricular tachycardia) ([place])    Radial styloid tenosynovitis 03/12/2018   Wheelchair confinement 02/27/2018   Localized osteoporosis with current pathological fracture with routine healing 01/19/2017   Wrist fracture 01/16/2017   Sprain of ankle 03/23/2016   Closed fracture of lateral malleolus 03/16/2016   Health care maintenance 01/24/2016   Blood pressure elevated without history of HTN 10/25/2015   Essential hypertension 10/25/2015   Multiple sclerosis ([place]) 10/02/2015   Chronic left shoulder pain 07/19/2015   Multiple sclerosis exacerbation ([place]) 07/14/2015   MS (multiple sclerosis) ([place]) 11/26/2014   Increased body mass index 11/26/2014   HPV test positive 11/26/2014   Status post laparoscopic supracervical hysterectomy 11/26/2014   Galactorrhea 11/26/2014   Back ache 05/21/2014   Adiposity 05/21/2014   Disordered sleep 05/21/2014   Muscle spasticity 05/21/2014   Spasticity 05/21/2014   Calculus of kidney 00/76/2263   Renal colic 33/54/5625   Hypercholesteremia 08/19/2013   Hereditary and idiopathic neuropathy 08/19/2013   Hypercholesterolemia without hypertriglyceridemia 08/19/2013   Bladder infection, chronic 07/25/2012   Disorder of bladder function 07/25/2012   Incomplete bladder emptying 07/25/2012   Microscopic hematuria 07/25/2012   Right upper quadrant pain 07/25/2012    REFERRING DIAG: MS  THERAPY DIAG:  Abnormality of gait and  mobility  Other abnormalities of gait and mobility  Muscle weakness (generalized)  Unsteadiness on feet  Difficulty in walking, not elsewhere classified  Other lack of coordination  Multiple sclerosis exacerbation (HCC)  Radiculopathy, cervical region  Rationale for Evaluation and Treatment Rehabilitation  PERTINENT HISTORY: Patient is a pleasant 61 year old female who is returning to PT  after MS exacerbation and hospitalization for COVID. Patient went to SNF for one month and then received home health PT for a month (8 visits). PMH includes HTN, SVT, neurogenic bowel, MS (1995), neuropathy, hx of wrist fx, HPV, Hypercholesteremia, adiposity, osteopenia, spasticity. She wears bilateral AFOs and uses a RW and/or wheelchair for mobility. She drives an adapted car  PRECAUTIONS: Fall  SUBJECTIVE: Patient reports doing well with no real complaints.   PAIN:  Are you having pain? No     TODAY'S TREATMENT:  Standing in // bars: -attempted step tap onto 2" block- patient able to kick the block x 10 reps BLE and maintain standing balance.  - standing with alt hand free on // rail x 15 reps each. -standing should taps x 12 reps (No UE on bars)  -Static stand without UE support x 10 -standing should taps x 12 reps (1 UE on bars)  -Lumbar flex/ext x 10 reps with B UE support -Stand Chest press with PVC pipe x 10 reps - Stand Witches brew- CW/CCW with Pipe x 10 reps each. -Stand hip abd x 10 reps BLE  Education provided throughout session via VC/TC and demonstration to facilitate movement at target joints and correct muscle activation for all testing and exercises performed.              PATIENT EDUCATION: Education details: Economist, strengthening Person educated: Patient Education method: Explanation, Demonstration, Tactile cues, and Verbal cues Education comprehension: verbalized understanding, returned demonstration, verbal cues required, and tactile cues required   HOME EXERCISE PROGRAM: No updates today       PT Short Term Goals -       PT SHORT TERM GOAL #1   Title Patient will be independent in home exercise program to improve strength/mobility for better functional independence with ADLs.    Baseline 10/4 HEP provided 11/8: HEP compliant    Time 4    Period Weeks    Status Achieved    Target Date 03/08/21              PT Long Term  Goals       PT LONG TERM GOAL #1   Title Patient will increase FOTO score to equal to or greater than  51/100   to demonstrate statistically significant improvement in mobility and quality of life.    Baseline 10/4: 42% 11/8: 36% 12/29: 45%; 06/09/2021= 36% 39: 36% 6/13: 45%    Time 12    Period Weeks    Status On-going    Target Date 01/10/22      PT LONG TERM GOAL #2   Title Patient (< 82 years old) will complete five times sit to stand test in < 20 seconds indicating an increased LE strength and improved balance.    Baseline 10/4: 33.15 seconds with heavy BUE support 11/8: 30 seconds; 12/29: deferred to future session. Pt attempted today but unable to complete d/t increase in R side LBP; 06/09/2021= 27.72 sec with UE support 3/9: 37.74 seconds 3/21: 29 seconds with BUE support. 09/27/2021- Did not assess secondary to recent report of increased MM spasms- Will attempt to address  next visit if appropriate. 6/8: 22.6 seconds    Time 12    Period Weeks    Status Partially Met    Target Date 01/10/22      PT LONG TERM GOAL #3   Title Patient will increase 10 meter walk test to <20 seconds as to improve gait speed for better community ambulation and to reduce fall risk.    Baseline 10/4: 43.17 seconds with RW 11/8: 40.8 seconds; 43 sec (0.23 m/s) with RW; 06/09/2021= 40.7 sec with RW 3/9: 38.8 seconds. 09/27/2021- DId not assess secondary to patient recent c/o of mm spasms- will attempt to address next visit. 6/13: 38 seconds    Time 12    Period Weeks    Status Partially Met    Target Date 01/10/22      PT LONG TERM GOAL #4   Title Patient will reduce timed up and go to <11 seconds to reduce fall risk and demonstrate improved transfer/gait ability.    Baseline 10/4: 52.66 seconds with RW 11/8: 49 seconds with RW; 12/29: 44.3 sec; 06/09/2021= 46.45 sec with use of RW 3/9: 39.9. 09/27/2021- Did not assess due to recent mm spams- will attempt to reassess next visit if appropriate. 6/13: 42 seconds     Time 12    Period Weeks    Status Partially Met    Target Date 01/10/22      PT LONG TERM GOAL #5   Title Patient will transfer from her shower seat independently for safety with ADLs.    Baseline 3/21: unable 6/13: has not attempted    Time 12    Period Weeks    Status On-going    Target Date 01/10/22      PT LONG TERM GOAL #6   Title Patient will get into bed without assistance to increase independence with functional mobility.    Baseline 3/21: needs assistance from sister 6/13; able to exit bed independently, needs min A for LE's from sister to get into bed.    Time 12    Period Weeks    Status Partially Met    Target Date 01/10/22             Plan     Clinical Impression Statement  Patient performed well today with focus on standing and LE strengthening. She required VC for specific tasks and challenged with overall standing endurance requiring brief rest breaks. She presented with good ability to pace herself and completed all therex as instructed. The pt will benefit from further skilled PT to improve mobility, strength, pain, gait and balance in order to decrease fall risk and increase QOL    Personal Factors and Comorbidities Age;Comorbidity 3+;Finances;Fitness;Past/Current Experience;Sex;Social Background;Time since onset of injury/illness/exacerbation;Transportation    Comorbidities HTN, SVT, neurogenic bowel, MS (1995), neuropathy, hx of wrist fx, HPV, Hypercholesteremia, adiposity, osteopenia, spasticity.    Examination-Activity Limitations Bathing;Bed Mobility;Bend;Carry;Continence;Dressing;Hygiene/Grooming;Stairs;Squat;Reach Overhead;Locomotion Level;Lift;Stand;Transfers;Toileting    Examination-Participation Restrictions Church;Cleaning;Community Activity;Laundry;Volunteer;Shop;Meal Prep;Yard Work    Merchant [employer] officer Evolving/Moderate complexity    Rehab Potential Good    PT Frequency 2x / week    PT Duration 12 weeks    PT  Treatment/Interventions ADLs/Self Care Home Management;Aquatic Therapy;Biofeedback;Cryotherapy;Electrical Stimulation;Iontophoresis 34m/ml Dexamethasone;Traction;Moist Heat;Ultrasound;DME Instruction;Gait training;Stair training;Functional mobility training;Therapeutic activities;Therapeutic exercise;Balance training;Neuromuscular re-education;Patient/family education;Orthotic Fit/Training;Wheelchair mobility training;Manual techniques;Compression bandaging;Passive range of motion;Dry needling;Energy conservation;Taping;Splinting;Vestibular    PT Next Visit Plan in // bars weight shift and LE strengthening    PT Home Exercise Plan No changes    Consulted and Agree  with Plan of Care Patient              Ollen Bowl, PT 11/04/2021, 9:08 PM

## 2021-11-09 NOTE — Therapy (Signed)
OUTPATIENT PHYSICAL THERAPY TREATMENT NOTE    Patient Name: Lisa Williams MRN: [id number] DOB:March 24, 1961, 61 y.o., female Today's Date: 11/10/2021  PCP: Kirk Ruths MD  REFERRING PROVIDER: Britt Bottom MD   PT End of Session - 11/10/21 0952     Visit Number 72    Number of Visits 90    Date for PT Re-Evaluation 01/10/22    Authorization Type 2/10 PN 6/27    PT Start Time 1015    PT Stop Time 1100    PT Time Calculation (min) 45 min    Equipment Utilized During Treatment Gait belt    Activity Tolerance Patient tolerated treatment well;Patient limited by fatigue    Behavior During Therapy [facility] for tasks assessed/performed                Past Medical History:  Diagnosis Date   Abdominal pain, right upper quadrant    Back pain    Calculus of kidney 12/09/2013   Chronic back pain    unspecified   Chronic left shoulder pain 02/03/2445   Complication of anesthesia    Functional disorder of bladder    other   Galactorrhea 11/26/2014   Chronic    Hereditary and idiopathic neuropathy 08/19/2013   HPV test positive    Hypercholesteremia 08/19/2013   Hypertension    Incomplete bladder emptying    Microscopic hematuria    MS (multiple sclerosis) (HCC)    Muscle spasticity 05/21/2014   Nonspecific findings on examination of urine    other   Osteopenia    PONV (postoperative nausea and vomiting)    Status post laparoscopic supracervical hysterectomy 11/26/2014   Tobacco user 11/26/2014   Wrist fracture    Past Surgical History:  Procedure Laterality Date   bilateral tubal ligation  1996   BREAST CYST EXCISION Left 2002   FRACTURE SURGERY     KNEE SURGERY     right   LAPAROSCOPIC SUPRACERVICAL HYSTERECTOMY  08/05/2013   ORIF WRIST FRACTURE Left 01/17/2017   Procedure: OPEN REDUCTION INTERNAL FIXATION (ORIF) WRIST FRACTURE;  Surgeon: Lovell Sheehan, MD;  Location: [facility];  Service: Orthopedics;  Laterality: Left;   RADIOLOGY WITH ANESTHESIA N/A  03/18/2020   Procedure: MRI WITH ANESTHESIA CERVICAL SPINE AND BRAIN  WITH AND WITHOUT CONTRAST;  Surgeon: Radiologist, Medication, MD;  Location: [place];  Service: Radiology;  Laterality: N/A;   TUBAL LIGATION Bilateral    VAGINAL HYSTERECTOMY  03/2006   Patient Active Problem List   Diagnosis Date Noted   Acute respiratory disease due to COVID-19 virus 11/21/2020   Weakness    Hypoalbuminemia due to protein-calorie malnutrition (HCC)    Neurogenic bowel    Neurogenic bladder    Labile blood pressure    Neuropathic pain    Abscess of female pelvis    SVT (supraventricular tachycardia) ([place])    Radial styloid tenosynovitis 03/12/2018   Wheelchair confinement 02/27/2018   Localized osteoporosis with current pathological fracture with routine healing 01/19/2017   Wrist fracture 01/16/2017   Sprain of ankle 03/23/2016   Closed fracture of lateral malleolus 03/16/2016   Health care maintenance 01/24/2016   Blood pressure elevated without history of HTN 10/25/2015   Essential hypertension 10/25/2015   Multiple sclerosis ([place]) 10/02/2015   Chronic left shoulder pain 07/19/2015   Multiple sclerosis exacerbation ([place]) 07/14/2015   MS (multiple sclerosis) ([place]) 11/26/2014   Increased body mass index 11/26/2014   HPV test positive 11/26/2014   Status post laparoscopic supracervical  hysterectomy 11/26/2014   Galactorrhea 11/26/2014   Back ache 05/21/2014   Adiposity 05/21/2014   Disordered sleep 05/21/2014   Muscle spasticity 05/21/2014   Spasticity 05/21/2014   Calculus of kidney 69/45/0388   Renal colic 82/80/0349   Hypercholesteremia 08/19/2013   Hereditary and idiopathic neuropathy 08/19/2013   Hypercholesterolemia without hypertriglyceridemia 08/19/2013   Bladder infection, chronic 07/25/2012   Disorder of bladder function 07/25/2012   Incomplete bladder emptying 07/25/2012   Microscopic hematuria 07/25/2012   Right upper quadrant pain 07/25/2012    REFERRING DIAG:  MS  THERAPY DIAG:  Abnormality of gait and mobility  Other abnormalities of gait and mobility  Muscle weakness (generalized)  Unsteadiness on feet  Rationale for Evaluation and Treatment Rehabilitation  PERTINENT HISTORY: Patient is a pleasant 61 year old female who is returning to PT after MS exacerbation and hospitalization for COVID. Patient went to SNF for one month and then received home health PT for a month (8 visits). PMH includes HTN, SVT, neurogenic bowel, MS (1995), neuropathy, hx of wrist fx, HPV, Hypercholesteremia, adiposity, osteopenia, spasticity. She wears bilateral AFOs and uses a RW and/or wheelchair for mobility. She drives an adapted car  PRECAUTIONS: Fall  SUBJECTIVE: Patient reports she ate something bad last night and was ill feeling all night. Still feeling weak and unstable.   PAIN:  Are you having pain? No     TODAY'S TREATMENT:  Seated:  BTB abduction 12x BTB adduction 12x Dynadisc hamstring isometric 12x Arm circles 10x abduction, 10x frontal plane Trunk extension pectoral stretch 10x  Weighted ball (3000 gr) -bicep curl to overhead press 10x -ball throw 12x   Weighted bar (5lb bar) -chest press 12x  -alternating UE raises 12x each side (D2 patterning) -bicep curl 12x -overhead press 12x -witches brew 12x    PATIENT EDUCATION: Education details: Economist, strengthening Person educated: Patient Education method: Explanation, Demonstration, Tactile cues, and Verbal cues Education comprehension: verbalized understanding, returned demonstration, verbal cues required, and tactile cues required   HOME EXERCISE PROGRAM: No updates today       PT Short Term Goals -       PT SHORT TERM GOAL #1   Title Patient will be independent in home exercise program to improve strength/mobility for better functional independence with ADLs.    Baseline 10/4 HEP provided 11/8: HEP compliant    Time 4    Period Weeks    Status Achieved     Target Date 03/08/21              PT Long Term Goals       PT LONG TERM GOAL #1   Title Patient will increase FOTO score to equal to or greater than  51/100   to demonstrate statistically significant improvement in mobility and quality of life.    Baseline 10/4: 42% 11/8: 36% 12/29: 45%; 06/09/2021= 36% 39: 36% 6/13: 45%    Time 12    Period Weeks    Status On-going    Target Date 01/10/22      PT LONG TERM GOAL #2   Title Patient (< 79 years old) will complete five times sit to stand test in < 20 seconds indicating an increased LE strength and improved balance.    Baseline 10/4: 33.15 seconds with heavy BUE support 11/8: 30 seconds; 12/29: deferred to future session. Pt attempted today but unable to complete d/t increase in R side LBP; 06/09/2021= 27.72 sec with UE support 3/9: 37.74 seconds 3/21: 29 seconds  with BUE support. 09/27/2021- Did not assess secondary to recent report of increased MM spasms- Will attempt to address next visit if appropriate. 6/8: 22.6 seconds    Time 12    Period Weeks    Status Partially Met    Target Date 01/10/22      PT LONG TERM GOAL #3   Title Patient will increase 10 meter walk test to <20 seconds as to improve gait speed for better community ambulation and to reduce fall risk.    Baseline 10/4: 43.17 seconds with RW 11/8: 40.8 seconds; 43 sec (0.23 m/s) with RW; 06/09/2021= 40.7 sec with RW 3/9: 38.8 seconds. 09/27/2021- DId not assess secondary to patient recent c/o of mm spasms- will attempt to address next visit. 6/13: 38 seconds    Time 12    Period Weeks    Status Partially Met    Target Date 01/10/22      PT LONG TERM GOAL #4   Title Patient will reduce timed up and go to <11 seconds to reduce fall risk and demonstrate improved transfer/gait ability.    Baseline 10/4: 52.66 seconds with RW 11/8: 49 seconds with RW; 12/29: 44.3 sec; 06/09/2021= 46.45 sec with use of RW 3/9: 39.9. 09/27/2021- Did not assess due to recent mm spams- will attempt to  reassess next visit if appropriate. 6/13: 42 seconds    Time 12    Period Weeks    Status Partially Met    Target Date 01/10/22      PT LONG TERM GOAL #5   Title Patient will transfer from her shower seat independently for safety with ADLs.    Baseline 3/21: unable 6/13: has not attempted    Time 12    Period Weeks    Status On-going    Target Date 01/10/22      PT LONG TERM GOAL #6   Title Patient will get into bed without assistance to increase independence with functional mobility.    Baseline 3/21: needs assistance from sister 6/13; able to exit bed independently, needs min A for LE's from sister to get into bed.    Time 12    Period Weeks    Status Partially Met    Target Date 01/10/22             Plan     Clinical Impression Statement  Patient unable to tolerate standing today due to illness secondary to eating bad food. Patient is highly motivated despite not feeling well. Tolerates seated strengthening without increase in nausea. The pt will benefit from further skilled PT to improve mobility, strength, pain, gait and balance in order to decrease fall risk and increase QOL    Personal Factors and Comorbidities Age;Comorbidity 3+;Finances;Fitness;Past/Current Experience;Sex;Social Background;Time since onset of injury/illness/exacerbation;Transportation    Comorbidities HTN, SVT, neurogenic bowel, MS (1995), neuropathy, hx of wrist fx, HPV, Hypercholesteremia, adiposity, osteopenia, spasticity.    Examination-Activity Limitations Bathing;Bed Mobility;Bend;Carry;Continence;Dressing;Hygiene/Grooming;Stairs;Squat;Reach Overhead;Locomotion Level;Lift;Stand;Transfers;Toileting    Examination-Participation Restrictions Church;Cleaning;Community Activity;Laundry;Volunteer;Shop;Meal Prep;Yard Work    Merchant navy officer Evolving/Moderate complexity    Rehab Potential Good    PT Frequency 2x / week    PT Duration 12 weeks    PT Treatment/Interventions ADLs/Self Care  Home Management;Aquatic Therapy;Biofeedback;Cryotherapy;Electrical Stimulation;Iontophoresis 69m/ml Dexamethasone;Traction;Moist Heat;Ultrasound;DME Instruction;Gait training;Stair training;Functional mobility training;Therapeutic activities;Therapeutic exercise;Balance training;Neuromuscular re-education;Patient/family education;Orthotic Fit/Training;Wheelchair mobility training;Manual techniques;Compression bandaging;Passive range of motion;Dry needling;Energy conservation;Taping;Splinting;Vestibular    PT Next Visit Plan in // bars weight shift and LE strengthening    PT Home Exercise Plan No  changes    Consulted and Agree with Plan of Care Patient              Janna Arch, PT, DPT  11/10/2021, 12:27 PM

## 2021-11-10 ENCOUNTER — Ambulatory Visit: Payer: PPO | Attending: Neurology

## 2021-11-10 DIAGNOSIS — R2689 Other abnormalities of gait and mobility: Secondary | ICD-10-CM | POA: Diagnosis not present

## 2021-11-10 DIAGNOSIS — M6281 Muscle weakness (generalized): Secondary | ICD-10-CM | POA: Diagnosis not present

## 2021-11-10 DIAGNOSIS — R2681 Unsteadiness on feet: Secondary | ICD-10-CM | POA: Insufficient documentation

## 2021-11-10 DIAGNOSIS — R269 Unspecified abnormalities of gait and mobility: Secondary | ICD-10-CM | POA: Insufficient documentation

## 2021-11-14 ENCOUNTER — Emergency Department: Payer: PPO

## 2021-11-14 ENCOUNTER — Inpatient Hospital Stay
Admission: EM | Admit: 2021-11-14 | Discharge: 2021-11-21 | DRG: 059 | Disposition: A | Discharge status: Skilled Nursing Facility | Payer: PPO | Attending: General Practice | Admitting: General Practice

## 2021-11-14 ENCOUNTER — Other Ambulatory Visit: Payer: Self-pay

## 2021-11-14 DIAGNOSIS — D849 Immunodeficiency, unspecified: Secondary | ICD-10-CM | POA: Diagnosis present

## 2021-11-14 DIAGNOSIS — L02214 Cutaneous abscess of groin: Secondary | ICD-10-CM

## 2021-11-14 DIAGNOSIS — Z6836 Body mass index (BMI) 36.0-36.9, adult: Secondary | ICD-10-CM

## 2021-11-14 DIAGNOSIS — B432 Subcutaneous pheomycotic abscess and cyst: Secondary | ICD-10-CM | POA: Diagnosis not present

## 2021-11-14 DIAGNOSIS — Z8249 Family history of ischemic heart disease and other diseases of the circulatory system: Secondary | ICD-10-CM

## 2021-11-14 DIAGNOSIS — E669 Obesity, unspecified: Secondary | ICD-10-CM | POA: Diagnosis present

## 2021-11-14 DIAGNOSIS — R001 Bradycardia, unspecified: Secondary | ICD-10-CM | POA: Diagnosis not present

## 2021-11-14 DIAGNOSIS — M810 Age-related osteoporosis without current pathological fracture: Secondary | ICD-10-CM | POA: Diagnosis not present

## 2021-11-14 DIAGNOSIS — K219 Gastro-esophageal reflux disease without esophagitis: Secondary | ICD-10-CM | POA: Diagnosis not present

## 2021-11-14 DIAGNOSIS — J986 Disorders of diaphragm: Secondary | ICD-10-CM | POA: Diagnosis not present

## 2021-11-14 DIAGNOSIS — W19XXXA Unspecified fall, initial encounter: Secondary | ICD-10-CM | POA: Diagnosis not present

## 2021-11-14 DIAGNOSIS — Z8616 Personal history of COVID-19: Secondary | ICD-10-CM

## 2021-11-14 DIAGNOSIS — I10 Essential (primary) hypertension: Secondary | ICD-10-CM | POA: Diagnosis present

## 2021-11-14 DIAGNOSIS — R41841 Cognitive communication deficit: Secondary | ICD-10-CM | POA: Diagnosis not present

## 2021-11-14 DIAGNOSIS — N319 Neuromuscular dysfunction of bladder, unspecified: Secondary | ICD-10-CM | POA: Diagnosis not present

## 2021-11-14 DIAGNOSIS — B957 Other staphylococcus as the cause of diseases classified elsewhere: Secondary | ICD-10-CM | POA: Diagnosis present

## 2021-11-14 DIAGNOSIS — L03317 Cellulitis of buttock: Secondary | ICD-10-CM | POA: Diagnosis not present

## 2021-11-14 DIAGNOSIS — Z832 Family history of diseases of the blood and blood-forming organs and certain disorders involving the immune mechanism: Secondary | ICD-10-CM

## 2021-11-14 DIAGNOSIS — E46 Unspecified protein-calorie malnutrition: Secondary | ICD-10-CM | POA: Diagnosis not present

## 2021-11-14 DIAGNOSIS — E7800 Pure hypercholesterolemia, unspecified: Secondary | ICD-10-CM | POA: Diagnosis present

## 2021-11-14 DIAGNOSIS — R2689 Other abnormalities of gait and mobility: Secondary | ICD-10-CM | POA: Diagnosis not present

## 2021-11-14 DIAGNOSIS — E559 Vitamin D deficiency, unspecified: Secondary | ICD-10-CM | POA: Diagnosis not present

## 2021-11-14 DIAGNOSIS — Z833 Family history of diabetes mellitus: Secondary | ICD-10-CM

## 2021-11-14 DIAGNOSIS — Z79899 Other long term (current) drug therapy: Secondary | ICD-10-CM | POA: Diagnosis not present

## 2021-11-14 DIAGNOSIS — Z87891 Personal history of nicotine dependence: Secondary | ICD-10-CM | POA: Diagnosis not present

## 2021-11-14 DIAGNOSIS — R1311 Dysphagia, oral phase: Secondary | ICD-10-CM | POA: Diagnosis not present

## 2021-11-14 DIAGNOSIS — G35 Multiple sclerosis: Principal | ICD-10-CM | POA: Diagnosis present

## 2021-11-14 DIAGNOSIS — M792 Neuralgia and neuritis, unspecified: Secondary | ICD-10-CM | POA: Diagnosis not present

## 2021-11-14 DIAGNOSIS — L0390 Cellulitis, unspecified: Secondary | ICD-10-CM

## 2021-11-14 DIAGNOSIS — L0291 Cutaneous abscess, unspecified: Secondary | ICD-10-CM

## 2021-11-14 DIAGNOSIS — R7989 Other specified abnormal findings of blood chemistry: Secondary | ICD-10-CM

## 2021-11-14 DIAGNOSIS — Z888 Allergy status to other drugs, medicaments and biological substances status: Secondary | ICD-10-CM | POA: Diagnosis not present

## 2021-11-14 DIAGNOSIS — Z83438 Family history of other disorder of lipoprotein metabolism and other lipidemia: Secondary | ICD-10-CM | POA: Diagnosis not present

## 2021-11-14 DIAGNOSIS — M6281 Muscle weakness (generalized): Secondary | ICD-10-CM | POA: Diagnosis not present

## 2021-11-14 DIAGNOSIS — M62838 Other muscle spasm: Secondary | ICD-10-CM | POA: Diagnosis not present

## 2021-11-14 DIAGNOSIS — K592 Neurogenic bowel, not elsewhere classified: Secondary | ICD-10-CM | POA: Diagnosis not present

## 2021-11-14 DIAGNOSIS — R2681 Unsteadiness on feet: Secondary | ICD-10-CM | POA: Diagnosis not present

## 2021-11-14 DIAGNOSIS — Z0389 Encounter for observation for other suspected diseases and conditions ruled out: Secondary | ICD-10-CM | POA: Diagnosis not present

## 2021-11-14 LAB — COMPREHENSIVE METABOLIC PANEL
ALT: 88 U/L — ABNORMAL HIGH (ref 0–44)
AST: 52 U/L — ABNORMAL HIGH (ref 15–41)
Albumin: 4 g/dL (ref 3.5–5.0)
Alkaline Phosphatase: 146 U/L — ABNORMAL HIGH (ref 38–126)
Anion gap: 10 (ref 5–15)
BUN: 22 mg/dL — ABNORMAL HIGH (ref 6–20)
CO2: 24 mmol/L (ref 22–32)
Calcium: 9.1 mg/dL (ref 8.9–10.3)
Chloride: 106 mmol/L (ref 98–111)
Creatinine, Ser: 0.68 mg/dL (ref 0.44–1.00)
GFR, Estimated: >60 mL/min (ref >60)
Glucose, Bld: 103 mg/dL — ABNORMAL HIGH (ref 70–99)
Potassium: 4.2 mmol/L (ref 3.5–5.1)
Sodium: 140 mmol/L (ref 135–145)
Total Bilirubin: 1 mg/dL (ref 0.3–1.2)
Total Protein: 6.8 g/dL (ref 6.5–8.1)

## 2021-11-14 LAB — CBC
HCT: 38.3 % (ref 36.0–46.0)
Hemoglobin: 13.1 g/dL (ref 12.0–15.0)
MCH: 26 pg (ref 26.0–34.0)
MCHC: 34.2 g/dL (ref 30.0–36.0)
MCV: 76 fL — ABNORMAL LOW (ref 80.0–100.0)
Platelets: 256 10*3/uL (ref 150–400)
RBC: 5.04 MIL/uL (ref 3.87–5.11)
RDW: 14.3 % (ref 11.5–15.5)
WBC: 10.9 10*3/uL — ABNORMAL HIGH (ref 4.0–10.5)
nRBC: 0 % (ref 0.0–0.2)

## 2021-11-14 MED ORDER — LACTATED RINGERS IV BOLUS (SEPSIS)
1000.0000 mL | Freq: Once | INTRAVENOUS | Status: AC
  Administered 2021-11-15: 1000 mL via INTRAVENOUS

## 2021-11-14 NOTE — ED Triage Notes (Signed)
Pt comes into the ED via EMS from home, pt c/o having a MS flare up today, unable to ambulate or stand, uses a wheel chair and walker at home. Pt a/ox4  183/87 HR92 RR16 98%RA

## 2021-11-14 NOTE — ED Provider Notes (Signed)
[facility] Provider Note    Event Date/Time   First MD Initiated Contact with Patient 11/14/21 2258     (approximate)   History   Multiple Sclerosis   HPI {Remember to add pertinent medical, surgical, social, and/or OB history to HPI:1} Lisa Williams is a 61 y.o. female  ***       Physical Exam   Triage Vital Signs: ED Triage Vitals  Enc Vitals Group     BP 11/14/21 1803 (!) 155/77     Pulse Rate 11/14/21 1803 95     Resp 11/14/21 1803 18     Temp 11/14/21 1803 98.6 F (37 C)     Temp Source 11/14/21 2215 Oral     SpO2 11/14/21 1803 95 %     Weight --      Height --      Head Circumference --      Peak Flow --      Pain Score --      Pain Loc --      Pain Edu? --      Excl. in GC? --     Most recent vital signs: Vitals:   11/14/21 1803 11/14/21 2215  BP: (!) 155/77 (!) 160/76  Pulse: 95 83  Resp: 18 18  Temp: 98.6 F (37 C) 100.3 F (37.9 C)  SpO2: 95% 93%    {Only need to document appropriate and relevant physical exam:1} General: Awake, no distress. *** CV:  Good peripheral perfusion. *** Resp:  Normal effort. *** Abd:  No distention. *** Other:  ***   ED Results / Procedures / Treatments   Labs (all labs ordered are listed, but only abnormal results are displayed) Labs Reviewed  CBC - Abnormal; Notable for the following components:      Result Value   WBC 10.9 (*)    MCV 76.0 (*)    All other components within normal limits  COMPREHENSIVE METABOLIC PANEL - Abnormal; Notable for the following components:   Glucose, Bld 103 (*)    BUN 22 (*)    AST 52 (*)    ALT 88 (*)    Alkaline Phosphatase 146 (*)    All other components within normal limits     EKG  ***   RADIOLOGY *** {USE THE WORD "INTERPRETED"!! You MUST document your own interpretation of imaging, as well as the fact that you reviewed the radiologist's report!:1}   PROCEDURES:  Critical Care performed:  {CriticalCareYesNo:19197::"Yes, see critical care procedure note(s)","No"}  Procedures   MEDICATIONS ORDERED IN ED: Medications  lactated ringers bolus 1,000 mL (has no administration in time range)     IMPRESSION / MDM / ASSESSMENT AND PLAN / ED COURSE  I reviewed the triage vital signs and the nursing notes.                              Differential diagnosis includes, but is not limited to, ***  Patient's presentation is most consistent with {EM COPA:27473}  {If the patient is on the monitor, remove the brackets and asterisks on the sentence below and remember to document it as a Procedure as well. Otherwise delete the sentence below:1} {**The patient is on the cardiac monitor to evaluate for evidence of arrhythmia and/or significant heart rate changes.**} {Remember to include, when applicable, any/all of the following data: independent review of imaging independent review of labs (comment specifically on pertinent positives  and negatives) review of specific prior hospitalizations, PCP/specialist notes, etc. discuss meds given and prescribed document any discussion with consultants (including hospitalists) any clinical decision tools you used and why (PECARN, NEXUS, etc.) did you consider admitting the patient? document social determinants of health affecting patient's care (homelessness, inability to follow up in a timely fashion, etc) document any pre-existing conditions increasing risk on current visit (e.g. diabetes and HTN increasing danger of high-risk chest pain/ACS) describes what meds you gave (especially parenteral) and why any other interventions?:1}     FINAL CLINICAL IMPRESSION(S) / ED DIAGNOSES   Final diagnoses:  None     Rx / DC Orders   ED Discharge Orders     None        Note:  This document was prepared using Dragon voice recognition software and may include unintentional dictation errors.

## 2021-11-15 ENCOUNTER — Ambulatory Visit: Payer: PPO

## 2021-11-15 DIAGNOSIS — G35 Multiple sclerosis: Secondary | ICD-10-CM | POA: Diagnosis present

## 2021-11-15 DIAGNOSIS — L02214 Cutaneous abscess of groin: Secondary | ICD-10-CM

## 2021-11-15 DIAGNOSIS — Z83438 Family history of other disorder of lipoprotein metabolism and other lipidemia: Secondary | ICD-10-CM | POA: Diagnosis not present

## 2021-11-15 DIAGNOSIS — Z832 Family history of diseases of the blood and blood-forming organs and certain disorders involving the immune mechanism: Secondary | ICD-10-CM | POA: Diagnosis not present

## 2021-11-15 DIAGNOSIS — Z79899 Other long term (current) drug therapy: Secondary | ICD-10-CM | POA: Diagnosis not present

## 2021-11-15 DIAGNOSIS — B957 Other staphylococcus as the cause of diseases classified elsewhere: Secondary | ICD-10-CM | POA: Diagnosis present

## 2021-11-15 DIAGNOSIS — I10 Essential (primary) hypertension: Secondary | ICD-10-CM | POA: Diagnosis present

## 2021-11-15 DIAGNOSIS — Z888 Allergy status to other drugs, medicaments and biological substances status: Secondary | ICD-10-CM | POA: Diagnosis not present

## 2021-11-15 DIAGNOSIS — Z8616 Personal history of COVID-19: Secondary | ICD-10-CM | POA: Diagnosis not present

## 2021-11-15 DIAGNOSIS — D849 Immunodeficiency, unspecified: Secondary | ICD-10-CM | POA: Diagnosis present

## 2021-11-15 DIAGNOSIS — R7989 Other specified abnormal findings of blood chemistry: Secondary | ICD-10-CM

## 2021-11-15 DIAGNOSIS — Z6836 Body mass index (BMI) 36.0-36.9, adult: Secondary | ICD-10-CM | POA: Diagnosis not present

## 2021-11-15 DIAGNOSIS — E7800 Pure hypercholesterolemia, unspecified: Secondary | ICD-10-CM | POA: Diagnosis present

## 2021-11-15 DIAGNOSIS — Z8249 Family history of ischemic heart disease and other diseases of the circulatory system: Secondary | ICD-10-CM | POA: Diagnosis not present

## 2021-11-15 DIAGNOSIS — Z87891 Personal history of nicotine dependence: Secondary | ICD-10-CM | POA: Diagnosis not present

## 2021-11-15 DIAGNOSIS — Z833 Family history of diabetes mellitus: Secondary | ICD-10-CM | POA: Diagnosis not present

## 2021-11-15 DIAGNOSIS — R001 Bradycardia, unspecified: Secondary | ICD-10-CM | POA: Diagnosis not present

## 2021-11-15 DIAGNOSIS — E669 Obesity, unspecified: Secondary | ICD-10-CM | POA: Diagnosis present

## 2021-11-15 LAB — URINALYSIS, COMPLETE (UACMP) WITH MICROSCOPIC
Bacteria, UA: NONE SEEN
Bilirubin Urine: NEGATIVE
Glucose, UA: NEGATIVE mg/dL
Hgb urine dipstick: NEGATIVE
Ketones, ur: 20 mg/dL — AB
Leukocytes,Ua: NEGATIVE
Nitrite: NEGATIVE
Protein, ur: NEGATIVE mg/dL
Specific Gravity, Urine: 1.029 (ref 1.005–1.030)
pH: 5 (ref 5.0–8.0)

## 2021-11-15 LAB — CBC
HCT: 36.4 % (ref 36.0–46.0)
Hemoglobin: 12.8 g/dL (ref 12.0–15.0)
MCH: 26.7 pg (ref 26.0–34.0)
MCHC: 35.2 g/dL (ref 30.0–36.0)
MCV: 76 fL — ABNORMAL LOW (ref 80.0–100.0)
Platelets: 240 10*3/uL (ref 150–400)
RBC: 4.79 MIL/uL (ref 3.87–5.11)
RDW: 13.9 % (ref 11.5–15.5)
WBC: 7 10*3/uL (ref 4.0–10.5)
nRBC: 0 % (ref 0.0–0.2)

## 2021-11-15 LAB — CREATININE, SERUM
Creatinine, Ser: 0.66 mg/dL (ref 0.44–1.00)
GFR, Estimated: >60 mL/min (ref >60)

## 2021-11-15 LAB — PROTIME-INR
INR: 1.1 (ref 0.8–1.2)
Prothrombin Time: 13.7 seconds (ref 11.4–15.2)

## 2021-11-15 LAB — LACTIC ACID, PLASMA: Lactic Acid, Venous: 0.9 mmol/L (ref 0.5–1.9)

## 2021-11-15 MED ORDER — SODIUM CHLORIDE 0.9 % IV SOLN
2.0000 g | Freq: Once | INTRAVENOUS | Status: AC
  Administered 2021-11-15: 2 g via INTRAVENOUS

## 2021-11-15 MED ORDER — SODIUM CHLORIDE 0.9 % IV SOLN: 1000.0000 mg | INTRAVENOUS | Status: DC

## 2021-11-15 MED ORDER — MORPHINE SULFATE (PF) 2 MG/ML IV SOLN: 2.0000 mg | INTRAVENOUS | Status: DC | PRN

## 2021-11-15 MED ORDER — VANCOMYCIN HCL 1250 MG/250ML IV SOLN
1250.0000 mg | Freq: Two times a day (BID) | INTRAVENOUS | Status: DC
  Administered 2021-11-15 – 2021-11-18 (×6): 1250 mg via INTRAVENOUS
  Filled 2021-11-15 (×6): qty 250

## 2021-11-15 MED ORDER — MORPHINE SULFATE (PF) 2 MG/ML IV SOLN
1.0000 mg | Freq: Every day | INTRAVENOUS | Status: DC | PRN
  Administered 2021-11-16: 1 mg via INTRAVENOUS
  Filled 2021-11-15: qty 1

## 2021-11-15 MED ORDER — IRBESARTAN 150 MG PO TABS
75.0000 mg | ORAL_TABLET | Freq: Every day | ORAL | Status: DC
  Administered 2021-11-15 – 2021-11-21 (×7): 75 mg via ORAL
  Filled 2021-11-15 (×7): qty 1

## 2021-11-15 MED ORDER — OXYBUTYNIN CHLORIDE ER 10 MG PO TB24
10.0000 mg | ORAL_TABLET | Freq: Every day | ORAL | Status: DC
  Administered 2021-11-15 – 2021-11-21 (×7): 10 mg via ORAL
  Filled 2021-11-15 (×7): qty 1

## 2021-11-15 MED ORDER — SODIUM CHLORIDE 0.9 % IV SOLN
1000.0000 mg | INTRAVENOUS | Status: DC
  Administered 2021-11-15: 1000 mg via INTRAVENOUS
  Filled 2021-11-15: qty 16

## 2021-11-15 MED ORDER — ACETAMINOPHEN 650 MG RE SUPP: 650.0000 mg | Freq: Four times a day (QID) | RECTAL | Status: DC | PRN

## 2021-11-15 MED ORDER — ENOXAPARIN SODIUM 60 MG/0.6ML IJ SOSY
0.5000 mg/kg | PREFILLED_SYRINGE | INTRAMUSCULAR | Status: DC
  Administered 2021-11-15 – 2021-11-21 (×7): 60 mg via SUBCUTANEOUS
  Filled 2021-11-15 (×7): qty 0.6

## 2021-11-15 MED ORDER — TIZANIDINE HCL 4 MG PO TABS
4.0000 mg | ORAL_TABLET | Freq: Two times a day (BID) | ORAL | Status: DC
Start: 2021-11-15 — End: 2021-11-21
  Administered 2021-11-15 – 2021-11-21 (×13): 4 mg via ORAL
  Filled 2021-11-15 (×14): qty 1
  Filled 2021-11-15: qty 2
  Filled 2021-11-15: qty 1

## 2021-11-15 MED ORDER — BACLOFEN 10 MG PO TABS
20.0000 mg | ORAL_TABLET | Freq: Three times a day (TID) | ORAL | Status: DC
  Administered 2021-11-15 – 2021-11-21 (×20): 20 mg via ORAL
  Filled 2021-11-15 (×20): qty 2

## 2021-11-15 MED ORDER — SODIUM CHLORIDE 0.9 % IV SOLN
2.0000 g | Freq: Three times a day (TID) | INTRAVENOUS | Status: DC
  Administered 2021-11-15 – 2021-11-18 (×9): 2 g via INTRAVENOUS
  Filled 2021-11-15 (×2): qty 2
  Filled 2021-11-15: qty 12.5
  Filled 2021-11-15 (×3): qty 2
  Filled 2021-11-15 (×2): qty 12.5
  Filled 2021-11-15 (×3): qty 2

## 2021-11-15 MED ORDER — ONDANSETRON HCL 4 MG PO TABS
4.0000 mg | ORAL_TABLET | Freq: Four times a day (QID) | ORAL | Status: DC | PRN
  Administered 2021-11-17 – 2021-11-18 (×2): 4 mg via ORAL
  Filled 2021-11-15 (×2): qty 1

## 2021-11-15 MED ORDER — ONDANSETRON HCL 4 MG/2ML IJ SOLN: 4.0000 mg | Freq: Four times a day (QID) | INTRAMUSCULAR | Status: DC | PRN

## 2021-11-15 MED ORDER — SIPONIMOD FUMARATE 2 MG PO TABS
2.0000 mg | ORAL_TABLET | Freq: Every day | ORAL | Status: DC
  Administered 2021-11-17 – 2021-11-21 (×5): 2 mg via ORAL
  Filled 2021-11-15 (×4): qty 1

## 2021-11-15 MED ORDER — VANCOMYCIN HCL 2000 MG/400ML IV SOLN
2000.0000 mg | Freq: Once | INTRAVENOUS | Status: AC
Start: 2021-11-15 — End: 2021-11-15
  Administered 2021-11-15: 2000 mg via INTRAVENOUS
  Filled 2021-11-15 (×2): qty 400

## 2021-11-15 MED ORDER — GABAPENTIN 100 MG PO CAPS
100.0000 mg | ORAL_CAPSULE | Freq: Two times a day (BID) | ORAL | Status: DC
  Administered 2021-11-15 – 2021-11-21 (×13): 100 mg via ORAL
  Filled 2021-11-15 (×13): qty 1

## 2021-11-15 MED ORDER — ACETAMINOPHEN 325 MG PO TABS
650.0000 mg | ORAL_TABLET | Freq: Four times a day (QID) | ORAL | Status: DC | PRN
  Administered 2021-11-15 (×2): 650 mg via ORAL
  Filled 2021-11-15 (×2): qty 2

## 2021-11-15 MED ORDER — LIDOCAINE-EPINEPHRINE (PF) 2 %-1:200000 IJ SOLN
20.0000 mL | Freq: Once | INTRAMUSCULAR | Status: AC
  Administered 2021-11-15: 20 mL via INTRADERMAL
  Filled 2021-11-15: qty 20

## 2021-11-15 MED ORDER — SODIUM CHLORIDE 0.9 % IV SOLN
1000.0000 mg | Freq: Every day | INTRAVENOUS | Status: AC
  Administered 2021-11-16 – 2021-11-19 (×4): 1000 mg via INTRAVENOUS
  Filled 2021-11-15 (×4): qty 16

## 2021-11-15 MED ORDER — VITAMIN D 25 MCG (1000 UNIT) PO TABS
5000.0000 [IU] | ORAL_TABLET | Freq: Every day | ORAL | Status: DC
  Administered 2021-11-15 – 2021-11-21 (×7): 5000 [IU] via ORAL
  Filled 2021-11-15 (×7): qty 5

## 2021-11-15 NOTE — Progress Notes (Signed)
Physical Therapy Evaluation Patient Details Name: Lisa Williams MRN: [id number] DOB: [birth year] Today's Date: 11/15/2021  History of Present Illness  Pt is a 61 y.o. female with PMH including HTN, MS symptomatic mostly for spasticity and lower extremity weakness, independent at home with wheelchair and walker and drives. Pt presented to the ED on 11/14/21 with concern for multiple sclerosis exacerbation. Pt underwent I&D in ED for left groin abscess.   Clinical Impression  Pt is a pleasant 61 year old female who was admitted for MS exacerbation affecting bilat LE strength and mobility. Pt is motivated and eager to return to baseline mobility which includes transfers and ambulation with a RW for short household distances and utilizing a manual wheelchair for longer distances. Pt performs bed mobility supine to sit with Mod A for LE mgmt, sit to supine with Max A for bilat LE mgmt and truncal support with pt actively participating in transfers utilizing primarily UE strength. Attempted x2 sit-to-stand transfers with pt unable to stand with RW and Max Ax2. Pt demonstrates deficits with bilat LE strength, balance, and functional mobility tasks. Would benefit from skilled PT to address above deficits and promote optimal return to PLOF. Recommend CIR at discharge due to pt mobility status, tolerance, and motivation to return to PLOF.    Recommendations for follow up therapy are one component of a multi-disciplinary discharge planning process, led by the attending physician.  Recommendations may be updated based on patient status, additional functional criteria and insurance authorization.  Follow Up Recommendations Acute inpatient rehab (3hours/day)      Assistance Recommended at Discharge Intermittent Supervision/Assistance  Patient can return home with the following  A lot of help with walking and/or transfers;A little help with bathing/dressing/bathroom;Assistance with cooking/housework;Help  with stairs or ramp for entrance;Assist for transportation    Equipment Recommendations None recommended by PT  Recommendations for Other Services  OT consult    Functional Status Assessment Patient has had a recent decline in their functional status and demonstrates the ability to make significant improvements in function in a reasonable and predictable amount of time.     Precautions / Restrictions Precautions Precautions: Fall Restrictions Weight Bearing Restrictions: No      Mobility  Bed Mobility Overal bed mobility: Needs Assistance Bed Mobility: Supine to Sit, Sit to Supine     Supine to sit: Mod assist Sit to supine: Max assist   General bed mobility comments: Pt required Mod A for supine to sit for bilat LE management, truncal support as CGA as pt utilized bilat UEs on bed/rail to sit upright. Pt required Max A for sit to supine with bilat LE management and truncal support. Once supine, pt able to utilize UE with Min Ax2 to scoot toward HOB in supine for repositioning.    Transfers Overall transfer level: Needs assistance Equipment used: Rolling walker (2 wheels) Transfers: Sit to/from Stand Sit to Stand: Total assist, +2 physical assistance, From elevated surface           General transfer comment: Attempted sit-to-stand x2 trials with pt unable to stand this date. Pt attempted to stand with 1 UE pushing from bed and 1 UE on RW.    Ambulation/Gait               General Gait Details: NT this date; pt unable to tolerate/safety concerns  Stairs            Wheelchair Mobility    Modified Rankin (Stroke Patients Only)  Balance Overall balance assessment: Needs assistance Sitting-balance support: Feet supported, No upper extremity supported Sitting balance-Leahy Scale: Fair Sitting balance - Comments: Pt able to sit EOB without UE support as long as scooted back with CoM over bed, no LOB. When scooted forward toward EOB, pt utilized UE  support on bed for static sitting balance.   Standing balance support: Reliant on assistive device for balance, During functional activity, Bilateral upper extremity supported Standing balance-Leahy Scale: Zero Standing balance comment: Attempted standing x2 trials with pt unable                             Pertinent Vitals/Pain Pain Assessment Pain Assessment: 0-10 Pain Score: 3  (Pt reported 3.5/10 pain) Pain Location: L groin due to abscess Pain Descriptors / Indicators: Discomfort Pain Intervention(s): Limited activity within patient's tolerance, Monitored during session, Repositioned    Home Living Family/patient expects to be discharged to:: Private residence Living Arrangements: Parent;Other relatives Available Help at Discharge: Family Type of Home: House Home Access: Ramped entrance     Alternate Level Stairs-Number of Steps: Pt does not go upstairs; unable to complete stairs at baseline. Home Layout: Two level;Able to live on main level with bedroom/bathroom Home Equipment: Rolling Walker (2 wheels);Hospital bed;Wheelchair - manual Additional Comments: Pt has ramped entrance into home in garage. Pt utilizes RW for car transfers, but wheelchair to get in/out of home. Pt ambulatory with RW at baseline. Reported handicap accessible bathroom set up, and pt lives on 1st floor unable to ascend stairs at baseline.    Prior Function Prior Level of Function : Driving;Independent/Modified Independent             Mobility Comments: Ambulatory short household distances with RW; utilizes wheelchair for longer distances ADLs Comments: Independent with bathing, dressing, cleaning, cooking.     Hand Dominance   Dominant Hand: Right    Extremity/Trunk Assessment   Upper Extremity Assessment Upper Extremity Assessment: Overall WFL for tasks assessed    Lower Extremity Assessment Lower Extremity Assessment: Generalized weakness (Bilat LE MMT: ankle DF/PF 2/5, knee  ext/flex pt unable to complete in supine and when seated pt utilized UE support under thigh to assist with limited ROM noted. UE assist for hip flexion in sitting with inability to move LEs against gravity.R LE clonus.)       Communication   Communication: No difficulties  Cognition Arousal/Alertness: Awake/alert Behavior During Therapy: WFL for tasks assessed/performed Overall Cognitive Status: Within Functional Limits for tasks assessed                                 General Comments: A&Ox4        General Comments      Exercises     Assessment/Plan    PT Assessment Patient needs continued PT services  PT Problem List Decreased strength;Decreased range of motion;Decreased activity tolerance;Decreased balance;Decreased mobility;Pain;Decreased coordination;Impaired tone       PT Treatment Interventions Gait training;Stair training;Functional mobility training;Therapeutic activities;Therapeutic exercise;Balance training;Patient/family education;DME instruction    PT Goals (Current goals can be found in the Care Plan section)  Acute Rehab PT Goals Patient Stated Goal: to get back to baseline mobility PT Goal Formulation: With patient Time For Goal Achievement: 11/29/21 Potential to Achieve Goals: Good    Frequency 7X/week     Co-evaluation  AM-PAC PT "6 Clicks" Mobility  Outcome Measure Help needed turning from your back to your side while in a flat bed without using bedrails?: A Lot Help needed moving from lying on your back to sitting on the side of a flat bed without using bedrails?: A Lot Help needed moving to and from a bed to a chair (including a wheelchair)?: Total Help needed standing up from a chair using your arms (e.g., wheelchair or bedside chair)?: Total Help needed to walk in hospital room?: Total   6 Click Score: 7    End of Session Equipment Utilized During Treatment: Gait belt Activity Tolerance: Patient tolerated  treatment well;Patient limited by fatigue Patient left: in bed;with call bell/phone within reach;with bed alarm set Nurse Communication: Mobility status PT Visit Diagnosis: Difficulty in walking, not elsewhere classified (R26.2);Muscle weakness (generalized) (M62.81);Other abnormalities of gait and mobility (R26.89)    Time: 1224-8250 PT Time Calculation (min) (ACUTE ONLY): 28 min   Charges:              Radford Pax, SPT  Lisa Williams 11/15/2021, 4:42 PM

## 2021-11-15 NOTE — Assessment & Plan Note (Addendum)
Triggered by acute infection IV Solu-Medrol 1 g x 3 days pending further recommendation by neurology Continue baclofen and Mayzent Neurology consulted

## 2021-11-15 NOTE — Progress Notes (Signed)
PHARMACIST - PHYSICIAN COMMUNICATION  CONCERNING:  Enoxaparin (Lovenox) for DVT Prophylaxis    RECOMMENDATION: Patient was prescribed enoxaprin 40mg  q24 hours for VTE prophylaxis.   Filed Weights   11/15/21 0500  Weight: 119 kg (262 lb 5.6 oz)    Body mass index is 36.59 kg/m.  Estimated Creatinine Clearance: 106.4 mL/min (by C-G formula based on SCr of 0.68 mg/dL).   Based on [facility] policy patient is candidate for enoxaparin 0.5mg /kg TBW SQ every 24 hours based on BMI being >30.  DESCRIPTION: Pharmacy has adjusted enoxaparin dose per [facility] policy.  Patient is now receiving enoxaparin 0.5 mg/kg every 24 hours   [facility], PharmD, [facility] 11/15/2021 5:18 AM

## 2021-11-15 NOTE — Assessment & Plan Note (Addendum)
S/p I&D in the ED with copious pus expression Continue Vanco and cefepime Wound care with packing

## 2021-11-15 NOTE — Assessment & Plan Note (Signed)
Appears chronic and stable. °

## 2021-11-15 NOTE — Progress Notes (Signed)
Lisa Williams is a 62 y.o. female with medical history significant for Hypertension, multiple sclerosis symptomatic mostly for spasticity and lower extremity weakness, independent at home with wheelchair and walker and drives, currently on Mayzent who presented to the ED with concern for multiple sclerosis flare in which she feels like she is unable to to use her legs at all.  She noticed a developing painful swelling in her left groin over the last few days.    ED provider performed I/D of a left groin abscess and had the wound packed.  Pt was started on vanc/cefepime and solumedrol 1000 mg daily.  * Multiple sclerosis exacerbation (HCC) Triggered by acute infection --neuro consult --cont IV solumedrol 1000 mg daily for 5 days --Continue baclofen and [place] (pt is taking her home supply and unwilling to let pharm/nursing administer and track it).   Abscess of left groin S/p I&D in the ED with copious pus expression --Continue Vanco and cefepime, pending wound cx sensivities --Wound care with packing, pre-medicate with IV morphine 1 mg prior to dressing change.  No charge note.

## 2021-11-15 NOTE — Progress Notes (Signed)
Pharmacy Antibiotic Note  Lisa Williams is a 61 y.o. female admitted on 11/14/2021 with wound/infection/cellulitis.  Pharmacy has been consulted for Cefepime & Vancomycin dosing x 7 days.  Plan: Cefepime 2 gm q8h per indication & renal fxn.  Pt given initial dose of Vancomycin 2 gm. Vancomycin 1250 mg IV Q 12 hrs.  Goal AUC 400-550. Expected AUC: 488.5 SCr used: 0.68, Vd used: 0.5, BMI 36.6  Pharmacy will continue to follow and will adjust abx dosing whenever warranted.  Temp (24hrs), Avg:99.2 F (37.3 C), Min:98.6 F (37 C), Max:100.3 F (37.9 C)  Recent Labs  Lab 11/14/21 1758 11/14/21 2357  WBC 10.9*  --   CREATININE 0.68  --   LATICACIDVEN  --  0.9    CrCl cannot be calculated (Unknown ideal weight.).    Allergies  Allergen Reactions   Amlodipine Swelling   Betadine [Povidone-Iodine]    Povidone Iodine Rash    Antimicrobials this admission: 7/11 Cefepime >> x 7 days 7/11 Vancomycin >> x 7 days  Microbiology results: 7/10 BCx: Pending 7/11 UCx: Pending  7/11 WoundCx:  Pending  Thank you for allowing pharmacy to be a part of this patient's care.  Otelia Sergeant, PharmD, [facility] 11/15/2021 4:51 AM

## 2021-11-15 NOTE — Assessment & Plan Note (Signed)
Continue home telmisartan

## 2021-11-15 NOTE — H&P (Addendum)
History and Physical    Patient: Lisa Williams MMH:[id number] DOB: Jan 04, 1961 DOA: 11/14/2021 DOS: the patient was seen and examined on 11/15/2021 PCP: Kirk Ruths, MD  Patient coming from: Home  Chief Complaint:  Chief Complaint  Patient presents with   Multiple Sclerosis    HPI: Lisa Williams is a 61 y.o. female with medical history significant for Hypertension, multiple sclerosis symptomatic mostly for spasticity and lower extremity weakness, independent at home with wheelchair and walker and dries, currently on [place] who presents to the ED with concern for multiple sclerosis flare in which she feels like she is unable to to use her legs at all.  She noticed a developing painful swelling in her left groin over the last few days and thinks this might be the cause of it.  She denies fever or chills. ED course and data review: Tmax 100.3 with pulse of 83, BP 160/76 and O2 sat 93% on room air Labs WBC 10,900 and lactic acid 0.9.  Mild LFT elevation with AST 52 and ALT 86 with alk phos of 146 but essentially unchanged from 8 months prior. Chest x-ray nonacute  Patient underwent I&D in the emergency room open abscess left groin. Patient started on cefepime and Flagyl and given a fluid bolus.  Given a gram of Solu-Medrol for suspected MS flare.  Hospitalist consulted for admission.     Past Medical History:  Diagnosis Date   Abdominal pain, right upper quadrant    Back pain    Calculus of kidney 12/09/2013   Chronic back pain    unspecified   Chronic left shoulder pain 1/59/4585   Complication of anesthesia    Functional disorder of bladder    other   Galactorrhea 11/26/2014   Chronic    Hereditary and idiopathic neuropathy 08/19/2013   HPV test positive    Hypercholesteremia 08/19/2013   Hypertension    Incomplete bladder emptying    Microscopic hematuria    MS (multiple sclerosis) (HCC)    Muscle spasticity 05/21/2014   Nonspecific findings on  examination of urine    other   Osteopenia    PONV (postoperative nausea and vomiting)    Status post laparoscopic supracervical hysterectomy 11/26/2014   Tobacco user 11/26/2014   Wrist fracture    Past Surgical History:  Procedure Laterality Date   bilateral tubal ligation  1996   BREAST CYST EXCISION Left 2002   FRACTURE SURGERY     KNEE SURGERY     right   LAPAROSCOPIC SUPRACERVICAL HYSTERECTOMY  08/05/2013   ORIF WRIST FRACTURE Left 01/17/2017   Procedure: OPEN REDUCTION INTERNAL FIXATION (ORIF) WRIST FRACTURE;  Surgeon: Lovell Sheehan, MD;  Location: [facility] ORS;  Service: Orthopedics;  Laterality: Left;   RADIOLOGY WITH ANESTHESIA N/A 03/18/2020   Procedure: MRI WITH ANESTHESIA CERVICAL SPINE AND BRAIN  WITH AND WITHOUT CONTRAST;  Surgeon: Radiologist, Medication, MD;  Location: [place];  Service: Radiology;  Laterality: N/A;   TUBAL LIGATION Bilateral    VAGINAL HYSTERECTOMY  03/2006   Social History:  reports that she has quit smoking. Her smoking use included cigarettes. She smoked an average of .5 packs per day. She has never used smokeless tobacco. She reports that she does not drink alcohol and does not use drugs.  Allergies  Allergen Reactions   Amlodipine Swelling   Betadine [Povidone-Iodine]    Povidone Iodine Rash    Family History  Problem Relation Age of Onset   Sickle cell trait Sister  Hypertension Sister    Supraventricular tachycardia Sister    Hypertension Sister    Diabetes Maternal Grandmother    Liver cancer Maternal Grandmother    Diabetes Maternal Aunt    Breast cancer Maternal Aunt        great MAT   Ovarian cancer Paternal Grandmother    Nephrolithiasis Father    Hypertension Father    Prostate cancer Father    Kidney disease Father    Heart failure Father    Hypertension Sister    Osteoarthritis Mother    Hypertension Mother    Hyperlipidemia Mother    GU problems Neg Hx    Urolithiasis Neg Hx     Prior to Admission medications    Medication Sig Start Date End Date Taking? Authorizing Provider  acetaminophen (TYLENOL) 500 MG tablet Take 500-1,000 mg by mouth every 6 (six) hours as needed for moderate pain or headache.   Yes [provider]  baclofen (LIORESAL) 20 MG tablet Take 1 tablet (20 mg total) by mouth 3 (three) times daily. 08/01/21  Yes Sater, Nanine Means, MD  Cholecalciferol 25 MCG (1000 UT) tablet Take 5,000 Units by mouth daily.   Yes [provider]  gabapentin (NEURONTIN) 100 MG capsule Take 1 capsule (100 mg total) by mouth 2 (two) times daily. 08/01/21  Yes Sater, Nanine Means, MD  oxybutynin (DITROPAN-XL) 10 MG 24 hr tablet Take 1 tablet (10 mg total) by mouth daily. 04/06/21  Yes Hollice Espy, MD  Siponimod Fumarate (MAYZENT) 2 MG TABS Take 2 mg by mouth daily. 08/24/21  Yes Sater, Nanine Means, MD  telmisartan (MICARDIS) 40 MG tablet Take 40 mg by mouth daily.   Yes [provider]  tiZANidine (ZANAFLEX) 4 MG tablet One po qAM, one po qPM and 2 po qHS Patient taking differently: Take 4 mg by mouth 2 (two) times daily. Take 4 mg in the morning and 4 mg at bedtime. 08/01/21  Yes Sater, Nanine Means, MD  dantrolene (DANTRIUM) 25 MG capsule 1 po bid x 5 d, then 1 po tid x 5d, then 1 po qid x 5d then 2 po tid Patient not taking: Reported on 11/15/2021 09/21/21   Britt Bottom, MD    Physical Exam: Vitals:   11/14/21 1803 11/14/21 2215 11/15/21 0130  BP: (!) 155/77 (!) 160/76 (!) 159/72  Pulse: 95 83 72  Resp: '18 18 19  ' Temp: 98.6 F (37 C) 100.3 F (37.9 C)   TempSrc:  Oral   SpO2: 95% 93% 97%   Physical Exam Vitals and nursing note reviewed.  Constitutional:      General: She is not in acute distress. HENT:     Head: Normocephalic and atraumatic.  Cardiovascular:     Rate and Rhythm: Normal rate and regular rhythm.     Heart sounds: Normal heart sounds.  Pulmonary:     Effort: Pulmonary effort is normal.     Breath sounds: Normal breath sounds.  Abdominal:     Palpations:  Abdomen is soft.     Tenderness: There is no abdominal tenderness.  Neurological:     Mental Status: Mental status is at baseline.     Labs on Admission: I have personally reviewed following labs and imaging studies  CBC: Recent Labs  Lab 11/14/21 1758  WBC 10.9*  HGB 13.1  HCT 38.3  MCV 76.0*  PLT 356   Basic Metabolic Panel: Recent Labs  Lab 11/14/21 1758  NA 140  K 4.2  CL  106  CO2 24  GLUCOSE 103*  BUN 22*  CREATININE 0.68  CALCIUM 9.1   GFR: CrCl cannot be calculated (Unknown ideal weight.). Liver Function Tests: Recent Labs  Lab 11/14/21 1758  AST 52*  ALT 88*  ALKPHOS 146*  BILITOT 1.0  PROT 6.8  ALBUMIN 4.0   No results for input(s): "LIPASE", "AMYLASE" in the last 168 hours. No results for input(s): "AMMONIA" in the last 168 hours. Coagulation Profile: Recent Labs  Lab 11/14/21 2357  INR 1.1   Cardiac Enzymes: No results for input(s): "CKTOTAL", "CKMB", "CKMBINDEX", "TROPONINI" in the last 168 hours. BNP (last 3 results) No results for input(s): "PROBNP" in the last 8760 hours. HbA1C: No results for input(s): "HGBA1C" in the last 72 hours. CBG: No results for input(s): "GLUCAP" in the last 168 hours. Lipid Profile: No results for input(s): "CHOL", "HDL", "LDLCALC", "TRIG", "CHOLHDL", "LDLDIRECT" in the last 72 hours. Thyroid Function Tests: No results for input(s): "TSH", "T4TOTAL", "FREET4", "T3FREE", "THYROIDAB" in the last 72 hours. Anemia Panel: No results for input(s): "VITAMINB12", "FOLATE", "FERRITIN", "TIBC", "IRON", "RETICCTPCT" in the last 72 hours. Urine analysis:    Component Value Date/Time   COLORURINE AMBER (A) 11/15/2021 0130   APPEARANCEUR HAZY (A) 11/15/2021 0130   APPEARANCEUR Clear 03/03/2021 1128   LABSPEC 1.029 11/15/2021 0130   LABSPEC 1.030 07/31/2011 1838   PHURINE 5.0 11/15/2021 0130   GLUCOSEU NEGATIVE 11/15/2021 0130   GLUCOSEU Negative 07/31/2011 1838   HGBUR NEGATIVE 11/15/2021 0130   BILIRUBINUR  NEGATIVE 11/15/2021 0130   BILIRUBINUR Negative 03/03/2021 1128   BILIRUBINUR Negative 07/31/2011 1838   KETONESUR 20 (A) 11/15/2021 0130   PROTEINUR NEGATIVE 11/15/2021 0130   NITRITE NEGATIVE 11/15/2021 0130   LEUKOCYTESUR NEGATIVE 11/15/2021 0130   LEUKOCYTESUR Negative 07/31/2011 1838    Radiological Exams on Admission: DG Chest Port 1 View  Result Date: 11/14/2021 CLINICAL DATA:  Questionable sepsis - evaluate for abnormality EXAM: PORTABLE CHEST 1 VIEW COMPARISON:  Radiograph 11/21/2020 FINDINGS: Chronic elevation of right hemidiaphragm. No acute airspace disease. Stable heart size and mediastinal contours. No pulmonary edema, pleural effusion or pneumothorax. No acute osseous abnormalities. IMPRESSION: Chronic elevation of the right hemidiaphragm. No acute abnormality. Electronically Signed   By: Keith Rake M.D.   On: 11/14/2021 23:56     Data Reviewed: Relevant notes from primary care and specialist visits, past discharge summaries as available in EHR, including Care Everywhere. Prior diagnostic testing as pertinent to current admission diagnoses Updated medications and problem lists for reconciliation ED course, including vitals, labs, imaging, treatment and response to treatment Triage notes, nursing and pharmacy notes and ED provider's notes Notable results as noted in HPI   Assessment and Plan: * Multiple sclerosis exacerbation ([place]) Triggered by acute infection IV Solu-Medrol 1 g x 3 days pending further recommendation by neurology Continue baclofen and [place] Neurology consulted  Abscess of left groin S/p I&D in the ED with copious pus expression Continue Vanco and cefepime Wound care with packing  Abnormal LFTs Appears chronic and stable  Essential hypertension Continue home telmisartan        DVT prophylaxis: Lovenox  Consults: Neurology, Dr. Cheral Marker  Advance Care Planning:   Code Status: Prior   Family Communication: none  Disposition  Plan: Back to previous home environment  Severity of Illness: The appropriate patient status for this patient is INPATIENT. Inpatient status is judged to be reasonable and necessary in order to provide the required intensity of service to ensure the patient's safety. The patient's  presenting symptoms, physical exam findings, and initial radiographic and laboratory data in the context of their chronic comorbidities is felt to place them at high risk for further clinical deterioration. Furthermore, it is not anticipated that the patient will be medically stable for discharge from the hospital within 2 midnights of admission.   * I certify that at the point of admission it is my clinical judgment that the patient will require inpatient hospital care spanning beyond 2 midnights from the point of admission due to high intensity of service, high risk for further deterioration and high frequency of surveillance required.*  Author: Athena Masse, MD 11/15/2021 4:18 AM  For on call review [email or web address].

## 2021-11-15 NOTE — Consult Note (Signed)
NEURO HOSPITALIST CONSULT NOTE   Requestig physician: Dr. Fran Lowes  Reason for Consult: Multiple sclerosis exacerbation  History obtained from:   Patient and Chart     HPI:                                                                                                                                          Lisa Williams is an 61 y.o. female with a 30 year history of multiple sclerosis, patient of Dr. Epimenio Foot on disease-modifying therapy with siponimod fumarate (Mayzent), ambulates with a walker at baseline, who presented to the hospital with a left inguinal crease abscess and acute worsening of her chronic R > L lower extremity weakness. She states that exacerbations have been triggered in the past by infections and that her current weakness is similar to prior exacerbations. She denies any fevers, chills or sweats. Her abscess was drained in the ED and she was admitted for possible MS exacerbation. She has been started on ABX. Other PMHx as listed below has been reviewed.   Past Medical History:  Diagnosis Date   Abdominal pain, right upper quadrant    Back pain    Calculus of kidney 12/09/2013   Chronic back pain    unspecified   Chronic left shoulder pain 07/19/2015   Complication of anesthesia    Functional disorder of bladder    other   Galactorrhea 11/26/2014   Chronic    Hereditary and idiopathic neuropathy 08/19/2013   HPV test positive    Hypercholesteremia 08/19/2013   Hypertension    Incomplete bladder emptying    Microscopic hematuria    MS (multiple sclerosis) (HCC)    Muscle spasticity 05/21/2014   Nonspecific findings on examination of urine    other   Osteopenia    PONV (postoperative nausea and vomiting)    Status post laparoscopic supracervical hysterectomy 11/26/2014   Tobacco user 11/26/2014   Wrist fracture     Past Surgical History:  Procedure Laterality Date   bilateral tubal ligation  1996   BREAST CYST EXCISION Left 2002    FRACTURE SURGERY     KNEE SURGERY     right   LAPAROSCOPIC SUPRACERVICAL HYSTERECTOMY  08/05/2013   ORIF WRIST FRACTURE Left 01/17/2017   Procedure: OPEN REDUCTION INTERNAL FIXATION (ORIF) WRIST FRACTURE;  Surgeon: Lyndle Herrlich, MD;  Location: [facility];  Service: Orthopedics;  Laterality: Left;   RADIOLOGY WITH ANESTHESIA N/A 03/18/2020   Procedure: MRI WITH ANESTHESIA CERVICAL SPINE AND BRAIN  WITH AND WITHOUT CONTRAST;  Surgeon: Radiologist, Medication, MD;  Location: [facility];  Service: Radiology;  Laterality: N/A;   TUBAL LIGATION Bilateral    VAGINAL HYSTERECTOMY  03/2006    Family History  Problem Relation Age of Onset   Sickle cell trait  Sister    Hypertension Sister    Supraventricular tachycardia Sister    Hypertension Sister    Diabetes Maternal Grandmother    Liver cancer Maternal Grandmother    Diabetes Maternal Aunt    Breast cancer Maternal Aunt        great MAT   Ovarian cancer Paternal Grandmother    Nephrolithiasis Father    Hypertension Father    Prostate cancer Father    Kidney disease Father    Heart failure Father    Hypertension Sister    Osteoarthritis Mother    Hypertension Mother    Hyperlipidemia Mother    GU problems Neg Hx    Urolithiasis Neg Hx             Social History:  reports that she has quit smoking. Her smoking use included cigarettes. She smoked an average of .5 packs per day. She has never used smokeless tobacco. She reports that she does not drink alcohol and does not use drugs.  Allergies  Allergen Reactions   Amlodipine Swelling   Betadine [Povidone-Iodine]    Povidone Iodine Rash    MEDICATIONS:                                                                                                                     Prior to Admission:  Medications Prior to Admission  Medication Sig Dispense Refill Last Dose   acetaminophen (TYLENOL) 500 MG tablet Take 500-1,000 mg by mouth every 6 (six) hours as needed for moderate pain or headache.    prn at prn   baclofen (LIORESAL) 20 MG tablet Take 1 tablet (20 mg total) by mouth 3 (three) times daily. 270 each 3 11/14/2021 at 1415   Cholecalciferol 25 MCG (1000 UT) tablet Take 5,000 Units by mouth daily.   11/14/2021 at 0815   gabapentin (NEURONTIN) 100 MG capsule Take 1 capsule (100 mg total) by mouth 2 (two) times daily. 180 capsule 3 11/14/2021 at 0815   oxybutynin (DITROPAN-XL) 10 MG 24 hr tablet Take 1 tablet (10 mg total) by mouth daily. 90 tablet 3 11/14/2021 at 0815   Siponimod Fumarate (MAYZENT) 2 MG TABS Take 2 mg by mouth daily. 30 tablet 5 11/14/2021 at 0815   telmisartan (MICARDIS) 40 MG tablet Take 40 mg by mouth daily.   11/14/2021 at 0815   tiZANidine (ZANAFLEX) 4 MG tablet One po qAM, one po qPM and 2 po qHS (Patient taking differently: Take 4 mg by mouth 2 (two) times daily. Take 4 mg in the morning and 4 mg at bedtime.) 120 tablet 5    dantrolene (DANTRIUM) 25 MG capsule 1 po bid x 5 d, then 1 po tid x 5d, then 1 po qid x 5d then 2 po tid (Patient not taking: Reported on 11/15/2021) 180 capsule 5 Not Taking   Scheduled:  baclofen  20 mg Oral TID   cholecalciferol  5,000 Units Oral Daily   enoxaparin (LOVENOX) injection  0.5 mg/kg Subcutaneous Q24H  gabapentin  100 mg Oral BID   irbesartan  75 mg Oral Daily   oxybutynin  10 mg Oral Daily   Siponimod Fumarate  2 mg Oral Daily   tiZANidine  4 mg Oral BID   Continuous:  ceFEPime (MAXIPIME) IV 2 g (11/15/21 2152)   [START ON 11/16/2021] methylPREDNISolone (SOLU-MEDROL) injection     vancomycin 1,250 mg (11/15/21 1741)     ROS:                                                                                                                                       As per HPI.    Blood pressure (!) 159/80, pulse 86, temperature 98.3 F (36.8 C), resp. rate 18, height 5\' 11"  (1.803 m), weight 119 kg, SpO2 95 %.   General Examination:                                                                                                        Physical Exam  HEENT-  Annex/AT    Lungs- Respirations unlabored Extremities- Warm and well perfused  Neurological Examination Mental Status: Awake, alert and oriented. Speech fluent without evidence of aphasia.  Able to follow all commands without difficulty. Cranial Nerves: II: Visual fields grossly normal. Right pupil 2 mm, left pupil 3 mm; both round and equally reactive III,IV, VI: EOMI. No nystagmus V,VII: Smile symmetric, facial temp sensation normal bilaterally VIII: Hearing intact to voice IX,X: No hypophonia or hoarseness XI: Symmetric shoulder shrug XII: Midline tongue extension Motor: BUE 4+/5 proximally and distally.  LLE 2/5 hip flexion, knee flexion; 3/5 knee extension, ADF and APF RLE 2/5 hip flexion, knee flexion, knee extension, ADF and APF Sensory: Temp and light touch intact throughout, bilaterally Deep Tendon Reflexes: Brisk low amplitude brachioradialis reflexes. Patellars also brisk but of lower amplitude than upper extremities. + Hoffman's bilaterally. Toes equivocal.    Cerebellar: No ataxia with FNF bilaterally, but with action tremor on the left.  Gait: Unable to assess   Lab Results: Basic Metabolic Panel: Recent Labs  Lab 11/14/21 1758 11/15/21 0528  NA 140  --   K 4.2  --   CL 106  --   CO2 24  --   GLUCOSE 103*  --   BUN 22*  --   CREATININE 0.68 0.66  CALCIUM 9.1  --     CBC: Recent Labs  Lab 11/14/21 1758 11/15/21 0528  WBC 10.9* 7.0  HGB 13.1 12.8  HCT  38.3 36.4  MCV 76.0* 76.0*  PLT 256 240    Cardiac Enzymes: No results for input(s): "CKTOTAL", "CKMB", "CKMBINDEX", "TROPONINI" in the last 168 hours.  Lipid Panel: No results for input(s): "CHOL", "TRIG", "HDL", "CHOLHDL", "VLDL", "LDLCALC" in the last 168 hours.  Imaging: DG Chest Port 1 View  Result Date: 11/14/2021 CLINICAL DATA:  Questionable sepsis - evaluate for abnormality EXAM: PORTABLE CHEST 1 VIEW COMPARISON:  Radiograph 11/21/2020 FINDINGS: Chronic  elevation of right hemidiaphragm. No acute airspace disease. Stable heart size and mediastinal contours. No pulmonary edema, pleural effusion or pneumothorax. No acute osseous abnormalities. IMPRESSION: Chronic elevation of the right hemidiaphragm. No acute abnormality. Electronically Signed   By: Keith Rake M.D.   On: 11/14/2021 23:56    Assessment: 61 year old female with a 30 year history of multiple sclerosis, patient of Dr. Felecia Shelling on disease-modifying therapy with siponimod fumarate ([place]zent), ambulates with a walker at baseline, who presented to the hospital with a left inguinal crease abscess and acute worsening of her chronic R > L lower extremity weakness. She states that exacerbations have been triggered in the past by infections and that her current weakness is similar to prior exacerbations. She denies any fevers, chills or sweats. Her abscess was drained in the ED and she was admitted for possible MS exacerbation. She has been started on ABX.  1. Exam findings are consistent with the patient's history of multiple sclerosis, with spastic weakness worse in the lower extremities than upper extremities, right leg weaker than left.  2. Patient's described symptoms are most consistent with an MS exacerbation, possibly triggered by infection given the abscess which was drained in the ED.  3. Currently without systemic symptoms of infection, no fever and normalized white count on ABX. Starting pulsed-dose steroids for her MS exacerbation is a relatively low-risk option, patient has been advised of the risks and benefits and she consents to be treated given that her overall presentation favors steroid treatment from a risk/benefit standpoint. She received her first dose of IV Solumedrol overnight.   Recommendations: - Continue IV Solumedrol 1000 mg qd for a total of 5 days.  - Close monitoring of CBG, electrolytes, WBC and vitals while on steroids.  - MRI scans could be obtained but would most  likely not change management, as scans that are negative for enhancing lesions due not completely rule out an MS exacerbation - Neurology will continue to follow  Electronically signed: Dr. Kerney Elbe 11/15/2021, 10:46 PM

## 2021-11-15 NOTE — TOC Initial Note (Signed)
Transition of Care [facility]) - Initial/Assessment Note    Patient Details  Name: Lisa Williams MRN: [id number] Date of Birth: 09-06-1960  Transition of Care [facility]) CM/SW Contact:    Chapman Fitch, RN Phone Number: 11/15/2021, 9:31 AM  Clinical Narrative:                  Transition of Care [facility]) Screening Note   Patient Details  Name: Lisa Williams Date of Birth: Sep 30, 1960   Transition of Care [facility]) CM/SW Contact:    Chapman Fitch, RN Phone Number: 11/15/2021, 9:31 AM    Transition of Care Department [facility]) has reviewed patient and no TOC needs have been identified at this time. We will continue to monitor patient advancement through interdisciplinary progression rounds. If new patient transition needs arise, please place a TOC consult.          Patient Goals and CMS Choice        Expected Discharge Plan and Services                                                Prior Living Arrangements/Services                       Activities of Daily Living Home Assistive Devices/Equipment: Built-in shower seat, Brace (specify type), Wheelchair, Environmental consultant (specify type) (rolling walker) ADL Screening (condition at time of admission) Patient's cognitive ability adequate to safely complete daily activities?: Yes Is the patient deaf or have difficulty hearing?: No Does the patient have difficulty seeing, even when wearing glasses/contacts?: No Does the patient have difficulty concentrating, remembering, or making decisions?: No Patient able to express need for assistance with ADLs?: Yes Does the patient have difficulty dressing or bathing?: Yes Independently performs ADLs?: No Communication: Independent Dressing (OT): Needs assistance Is this a change from baseline?: Change from baseline, expected to last >3 days Grooming: Needs assistance Is this a change from baseline?: Change from baseline, expected to last >3 days Feeding:  Independent Bathing: Needs assistance Is this a change from baseline?: Change from baseline, expected to last >3 days Toileting: Needs assistance Is this a change from baseline?: Change from baseline, expected to last >3days In/Out Bed: Dependent Is this a change from baseline?: Change from baseline, expected to last >3 days Walks in Home: Independent with device (comment) (rolling walker) Does the patient have difficulty walking or climbing stairs?: Yes Weakness of Legs: Both Weakness of Arms/Hands: None  Permission Sought/Granted                  Emotional Assessment              Admission diagnosis:  Multiple sclerosis (HCC) [G35] Abscess [L02.91] Multiple sclerosis exacerbation (HCC) [G35] Cellulitis, unspecified cellulitis site [L03.90] Patient Active Problem List   Diagnosis Date Noted   Abscess of left groin 11/15/2021   Abnormal LFTs 11/15/2021   Acute respiratory disease due to COVID-19 virus 11/21/2020   Weakness    Hypoalbuminemia due to protein-calorie malnutrition (HCC)    Neurogenic bowel    Neurogenic bladder    Labile blood pressure    Neuropathic pain    Abscess of female pelvis    SVT (supraventricular tachycardia) (HCC)    Radial styloid tenosynovitis 03/12/2018   Wheelchair confinement 02/27/2018   Localized osteoporosis with current pathological  fracture with routine healing 01/19/2017   Wrist fracture 01/16/2017   Sprain of ankle 03/23/2016   Closed fracture of lateral malleolus 03/16/2016   Health care maintenance 01/24/2016   Blood pressure elevated without history of HTN 10/25/2015   Essential hypertension 10/25/2015   Multiple sclerosis (HCC) 10/02/2015   Chronic left shoulder pain 07/19/2015   Multiple sclerosis exacerbation (HCC) 07/14/2015   MS (multiple sclerosis) (HCC) 11/26/2014   Increased body mass index 11/26/2014   HPV test positive 11/26/2014   Status post laparoscopic supracervical hysterectomy 11/26/2014    Galactorrhea 11/26/2014   Back ache 05/21/2014   Adiposity 05/21/2014   Disordered sleep 05/21/2014   Muscle spasticity 05/21/2014   Spasticity 05/21/2014   Calculus of kidney 12/09/2013   Renal colic 12/09/2013   Hypercholesteremia 08/19/2013   Hereditary and idiopathic neuropathy 08/19/2013   Hypercholesterolemia without hypertriglyceridemia 08/19/2013   Bladder infection, chronic 07/25/2012   Disorder of bladder function 07/25/2012   Incomplete bladder emptying 07/25/2012   Microscopic hematuria 07/25/2012   Right upper quadrant pain 07/25/2012   PCP:  Lauro Regulus, MD Pharmacy:   [id number] [place], [place] - [address] AT [facility] OF [address] [place] [address] Phone: [phone] Fax: [phone]  [facility]) - [place], [place] - [address] [place] [address] Phone: [phone] Fax: [phone]     Social Determinants of Health (SDOH) Interventions    Readmission Risk Interventions     No data to display

## 2021-11-15 NOTE — Progress Notes (Signed)
PHARMACY -  BRIEF ANTIBIOTIC NOTE   Pharmacy has received consult(s) for Vancomycin from an ED provider.  The patient's profile has been reviewed for ht/wt/allergies/indication/available labs.    One time order(s) placed for Vancomycin 2 gm per pt wt > 100 kg from 08/01/2021.  Further antibiotics/pharmacy consults should be ordered by admitting physician if indicated.                       Thank you, Otelia Sergeant, PharmD, [facility] 11/15/2021 1:45 AM

## 2021-11-16 DIAGNOSIS — G35 Multiple sclerosis: Secondary | ICD-10-CM | POA: Diagnosis not present

## 2021-11-16 LAB — CBC
HCT: 36.3 % (ref 36.0–46.0)
Hemoglobin: 12.6 g/dL (ref 12.0–15.0)
MCH: 26.4 pg (ref 26.0–34.0)
MCHC: 34.7 g/dL (ref 30.0–36.0)
MCV: 76.1 fL — ABNORMAL LOW (ref 80.0–100.0)
Platelets: 254 10*3/uL (ref 150–400)
RBC: 4.77 MIL/uL (ref 3.87–5.11)
RDW: 14.1 % (ref 11.5–15.5)
WBC: 7.3 10*3/uL (ref 4.0–10.5)
nRBC: 0 % (ref 0.0–0.2)

## 2021-11-16 LAB — BASIC METABOLIC PANEL
Anion gap: 6 (ref 5–15)
BUN: 25 mg/dL — ABNORMAL HIGH (ref 6–20)
CO2: 22 mmol/L (ref 22–32)
Calcium: 9.2 mg/dL (ref 8.9–10.3)
Chloride: 113 mmol/L — ABNORMAL HIGH (ref 98–111)
Creatinine, Ser: 0.61 mg/dL (ref 0.44–1.00)
GFR, Estimated: >60 mL/min (ref >60)
Glucose, Bld: 138 mg/dL — ABNORMAL HIGH (ref 70–99)
Potassium: 4.1 mmol/L (ref 3.5–5.1)
Sodium: 141 mmol/L (ref 135–145)

## 2021-11-16 LAB — URINE CULTURE: Culture: NO GROWTH

## 2021-11-16 LAB — MAGNESIUM: Magnesium: 2.2 mg/dL (ref 1.7–2.4)

## 2021-11-16 MED ORDER — MORPHINE SULFATE (PF) 2 MG/ML IV SOLN
2.0000 mg | Freq: Every day | INTRAVENOUS | Status: DC | PRN
  Administered 2021-11-17 – 2021-11-20 (×4): 2 mg via INTRAVENOUS
  Filled 2021-11-16 (×4): qty 1

## 2021-11-16 NOTE — Evaluation (Signed)
Occupational Therapy Evaluation Patient Details Name: Lisa Williams MRN: [id number] DOB: February 09, 1961 Today's Date: 11/16/2021   History of Present Illness Pt is a 61 y.o. female with PMH including HTN, MS symptomatic mostly for spasticity and lower extremity weakness, independent at home with wheelchair and walker and drives. Pt presented to the ED on 11/14/21 with concern for multiple sclerosis exacerbation. Pt underwent I&D in ED for left groin abscess.   Clinical Impression   Chart reviewed, pt greeted in bed agreeable to OT evaluation. Pt reports she is MOD I with ADL/IADL PTA amb with RW, prn use of mwc. Pt was receiving outpatient PT services. Pt presents with deficits in strength, activity tolerance, endurance all affecting optimal and safe ADL completion. Pt performs supine>sit with MIN A, STS with MOD-MAX A +1 with RW,therapist blocking knees; grooming with SET UP, LB dressing with MAX A (AFOs and shoes), boost in bed with MOD I with bed adjusted accordingly. Pt reports she will transfer to bed with PT after further washing up. Pt is performing below PLOF, would benefit from intensive rehab to facilitate return to current baseline. Pt is left in bed, NAD, all needs met. OT will follow acutely.      Recommendations for follow up therapy are one component of a multi-disciplinary discharge planning process, led by the attending physician.  Recommendations may be updated based on patient status, additional functional criteria and insurance authorization.   Follow Up Recommendations  Acute inpatient rehab (3hours/day)    Assistance Recommended at Discharge Intermittent Supervision/Assistance  Patient can return home with the following A lot of help with walking and/or transfers;A lot of help with bathing/dressing/bathroom    Functional Status Assessment  Patient has had a recent decline in their functional status and demonstrates the ability to make significant improvements in  function in a reasonable and predictable amount of time.  Equipment Recommendations  None recommended by OT    Recommendations for Other Services       Precautions / Restrictions Precautions Precautions: Fall Restrictions Weight Bearing Restrictions: No      Mobility Bed Mobility Overal bed mobility: Needs Assistance Bed Mobility: Supine to Sit     Supine to sit: Min assist Sit to supine: Max assist        Transfers Overall transfer level: Needs assistance Equipment used: Rolling walker (2 wheels) Transfers: Sit to/from Stand Sit to Stand: Max assist, From elevated surface                  Balance Overall balance assessment: Needs assistance Sitting-balance support: Feet supported, No upper extremity supported Sitting balance-Leahy Scale: Fair     Standing balance support: Reliant on assistive device for balance, During functional activity, Bilateral upper extremity supported Standing balance-Leahy Scale: Poor                             ADL either performed or assessed with clinical judgement   ADL Overall ADL's : Needs assistance/impaired                                       General ADL Comments: supine>sit with MIN A, STS with MOD-MAX A +1 with RW,therapist blocking knees; grooming with SET UP, LB dressing with MAX A (AFOs and shoes), boost in bed with MOD I with bed adjusted accordingly     Vision Patient  Visual Report: No change from baseline       Perception     Praxis      Pertinent Vitals/Pain Pain Assessment Pain Assessment: No/denies pain     Hand Dominance Right   Extremity/Trunk Assessment Upper Extremity Assessment Upper Extremity Assessment: Overall WFL for tasks assessed   Lower Extremity Assessment Lower Extremity Assessment: Generalized weakness   Cervical / Trunk Assessment Cervical / Trunk Assessment: Normal   Communication Communication Communication: No difficulties   Cognition  Arousal/Alertness: Awake/alert Behavior During Therapy: WFL for tasks assessed/performed Overall Cognitive Status: Within Functional Limits for tasks assessed                                       General Comments       Exercises     Shoulder Instructions      Home Living Family/patient expects to be discharged to:: Private residence Living Arrangements: Parent;Other relatives Available Help at Discharge: Family Type of Home: House Home Access: Ramped entrance     Home Layout: Two level;Able to live on main level with bedroom/bathroom     Bathroom Shower/Tub: Tub/shower unit   Bathroom Toilet: Handicapped height Bathroom Accessibility: Yes   Home Equipment: Conservation officer, nature (2 wheels);Hospital bed;Wheelchair - manual;Tub bench          Prior Functioning/Environment               Mobility Comments: ramped enterance into home.Mwc in and out of home. RW for car transfers, amb with RW at baseline. ADLs Comments: MOD I with bathing, dressing, cleaning, cooking        OT Problem List: Decreased strength;Decreased activity tolerance      OT Treatment/Interventions: Self-care/ADL training;Patient/family education;DME and/or AE instruction;Therapeutic activities;Therapeutic exercise    OT Goals(Current goals can be found in the care plan section) Acute Rehab OT Goals Patient Stated Goal: get stronger OT Goal Formulation: With patient Time For Goal Achievement: 11/30/21 Potential to Achieve Goals: Good ADL Goals Pt Will Perform Grooming: with set-up;sitting Pt Will Perform Lower Body Dressing: with modified independence Pt Will Transfer to Toilet: with modified independence Pt Will Perform Toileting - Clothing Manipulation and hygiene: with modified independence  OT Frequency: Min 3X/week    Co-evaluation              AM-PAC OT "6 Clicks" Daily Activity     Outcome Measure Help from another person eating meals?: None Help from another  person taking care of personal grooming?: None Help from another person toileting, which includes using toliet, bedpan, or urinal?: A Lot Help from another person bathing (including washing, rinsing, drying)?: A Lot Help from another person to put on and taking off regular upper body clothing?: None Help from another person to put on and taking off regular lower body clothing?: A Lot 6 Click Score: 18   End of Session Equipment Utilized During Treatment: Gait belt;Rolling walker (2 wheels) Nurse Communication: Mobility status  Activity Tolerance: Patient tolerated treatment well Patient left: in bed;with call bell/phone within reach;with bed alarm set  OT Visit Diagnosis: Muscle weakness (generalized) (M62.81);Unsteadiness on feet (R26.81)                Time: 5176-1607 OT Time Calculation (min): 26 min Charges:  OT General Charges $OT Visit: 1 Visit OT Evaluation $OT Eval Moderate Complexity: 1 Mod  Shanon Payor, OTD OTR/L  11/16/21, 12:20 PM

## 2021-11-16 NOTE — Progress Notes (Signed)
Inpatient Rehab Admissions Coordinator:   I received a secure chat  from acute MD requesting CIR rescreen pt. To see if we could bring her to CIR before completion of Solu-medrol. PT reports seeing Pt. Today and she has progressed from Total A yesterday to Min-mod A with transfers and min A with ambulation today. I discussed case with rehab MD, Dr. Sula Soda, who felt that Pt. Is progressing rapidly with administration of just 1-2 days of steroids and that she may ultimately progress to baseline without need for CIR. I will enter CIR consult at this time; however, plan is to follow progress and re-assess need for CIR once 5 day course of Solu-medrol is near completion.   Megan Salon, MS, CCC-SLP Rehab Admissions Coordinator  [phone] (celll) [phone] (office)

## 2021-11-16 NOTE — Progress Notes (Signed)
Inpatient Rehab Admissions Coordinator:   Per therapy recommendations, patient was screened for CIR candidacy by Megan Salon, MS, CCC-SLP. At this time, Pt. is total A for sit to stand and not at a level where I believe she can tolerate the intensity of CIR. However, pt. Just began 5 day course of Solu-medrol and may demonstrate progress as she nears completion of her course. CIR admissions team will follow and monitor for progress and participation with therapies and place consult order if Pt. appears to be an appropriate candidate. Please contact me with any questions.   Megan Salon, MS, CCC-SLP Rehab Admissions Coordinator  [phone] (celll) [phone] (office)

## 2021-11-16 NOTE — Progress Notes (Signed)
Physical Therapy Treatment Patient Details Name: Lisa Williams MRN: [id number] DOB: 1961-01-26 Today's Date: 11/16/2021   History of Present Illness Pt is a 61 y.o. female with PMH including HTN, MS symptomatic mostly for spasticity and lower extremity weakness, independent at home with wheelchair and walker and drives. Pt presented to the ED on 11/14/21 with concern for multiple sclerosis exacerbation. Pt underwent I&D in ED for left groin abscess.    PT Comments    Author returned to assist pt with returning to bed form recliner. +2 assistance in room for safety however pt was able to stand from recliner with max of 1. Took 2 attempts to achieve standing due to posterior push. Once in standing, required min-mod assist for lateral wt shift to allow oppose LE advancement. Overall pt tolerated well but is severely limited by fatigue. Acute PT will continue to follow and progress as able per current POC.    Recommendations for follow up therapy are one component of a multi-disciplinary discharge planning process, led by the attending physician.  Recommendations may be updated based on patient status, additional functional criteria and insurance authorization.  Follow Up Recommendations  Acute inpatient rehab (3hours/day)     Assistance Recommended at Discharge Frequent or constant Supervision/Assistance  Patient can return home with the following A lot of help with walking and/or transfers;A lot of help with bathing/dressing/bathroom;Assistance with cooking/housework;Assist for transportation;Help with stairs or ramp for entrance   Equipment Recommendations  None recommended by PT       Precautions / Restrictions Precautions Precautions: Fall Restrictions Weight Bearing Restrictions: No     Mobility  Bed Mobility Overal bed mobility: Needs Assistance Bed Mobility: Sit to Supine     Supine to sit: Mod assist Sit to supine: Max assist   General bed mobility comments: max  assist to progress BLEs into bed from EOB short sit    Transfers Overall transfer level: Needs assistance Equipment used: Rolling walker (2 wheels) Transfers: Sit to/from Stand Sit to Stand: +2 safety/equipment, Max assist           General transfer comment: Pt required max assist of one to stand from lower recliner height. Vcs for increased fwd wt shift and overall technique improvements. Pt required 2 attempts to achieve standing from recliner. First attempt pt has posterior LOB into chair. 2nd attempt was able to fully stand erect with fwd wt shift    Ambulation/Gait Ambulation/Gait assistance: Min assist, Mod assist Gait Distance (Feet): 3 Feet Assistive device: Rolling walker (2 wheels) (Bariatric) Gait Pattern/deviations: Step-to pattern Gait velocity: decreased     General Gait Details: pt required slightly more assistance to lateral wt shift to allow opposite LE advancement this afternoon versus this AM.     Balance Overall balance assessment: Needs assistance Sitting-balance support: Feet supported, No upper extremity supported Sitting balance-Leahy Scale: Fair     Standing balance support: Reliant on assistive device for balance, During functional activity, Bilateral upper extremity supported Standing balance-Leahy Scale: Poor Standing balance comment: pt is reliant on RW for all standing activity        Cognition Arousal/Alertness: Awake/alert Behavior During Therapy: WFL for tasks assessed/performed Overall Cognitive Status: Within Functional Limits for tasks assessed      General Comments: A&Ox4           General Comments General comments (skin integrity, edema, etc.): pt overall tolerated well but is limited by fatigue. She is demonstarting improvements in strength and safety with all functional task however is  still not at baseline functionally. reviewed importance of ther ex throughout the day to promote return in abuilities and improve safety       Pertinent Vitals/Pain Pain Assessment Pain Assessment: No/denies pain Pain Score: 0-No pain Pain Location: L groin due to abscess Pain Descriptors / Indicators: Discomfort Pain Intervention(s): Limited activity within patient's tolerance, Monitored during session, Premedicated before session, Repositioned    Home Living Family/patient expects to be discharged to:: Private residence Living Arrangements: Parent;Other relatives Available Help at Discharge: Family Type of Home: House Home Access: Ramped entrance       Home Layout: Two level;Able to live on main level with bedroom/bathroom Home Equipment: Rolling Walker (2 wheels);Hospital bed;Wheelchair - manual;Tub bench          PT Goals (current goals can now be found in the care plan section) Acute Rehab PT Goals Patient Stated Goal: " get better at rehab so I can return home." Progress towards PT goals: Progressing toward goals    Frequency    7X/week      PT Plan Current plan remains appropriate       AM-PAC PT "6 Clicks" Mobility   Outcome Measure  Help needed turning from your back to your side while in a flat bed without using bedrails?: A Lot Help needed moving from lying on your back to sitting on the side of a flat bed without using bedrails?: A Lot Help needed moving to and from a bed to a chair (including a wheelchair)?: A Lot Help needed standing up from a chair using your arms (e.g., wheelchair or bedside chair)?: A Lot Help needed to walk in hospital room?: A Lot Help needed climbing 3-5 steps with a railing? : Total 6 Click Score: 11    End of Session Equipment Utilized During Treatment: Gait belt Activity Tolerance: Patient tolerated treatment well;Patient limited by fatigue Patient left: with call bell/phone within reach;with family/visitor present;with nursing/sitter in room;in bed;with bed alarm set Nurse Communication: Mobility status PT Visit Diagnosis: Difficulty in walking, not elsewhere  classified (R26.2);Muscle weakness (generalized) (M62.81);Other abnormalities of gait and mobility (R26.89)     Time: 4332-9518 PT Time Calculation (min) (ACUTE ONLY): 13 min  Charges:  $Therapeutic Activity: 8-22 mins                     Jetta Lout PTA 11/16/21, 2:09 PM

## 2021-11-16 NOTE — Progress Notes (Signed)
Pharmacy Antibiotic Note  Lisa Williams is a 61 y.o. female admitted on 11/14/2021 with wound/infection/cellulitis. Past medical history includes hypertension and multiple sclerosis. Presented after developing painful swelling in left groin. Received I&D in the ED for open abscess of left groin. Also on steroids for suspected MS flare. Pharmacy has been consulted for Cefepime & Vancomycin dosing x 7 days.  On day 2 of antibiotics. Renal function is stable and consistent with baseline. Gram staining growing GPCs in pairs and GNR.  Plan: Continue cefepime 2 grams IV every 8 hours   Continue vancomycin 1250 mg IV every 12 hrs.  Goal AUC 400-600 Expected AUC: 569.5 SCr used: 0.8, Vd used: 0.5, BMI 36.6  Follow up C&S/de-escalation and renal function.  Temp (24hrs), Avg:98.5 F (36.9 C), Min:97.9 F (36.6 C), Max:99 F (37.2 C)  Recent Labs  Lab 11/14/21 1758 11/14/21 2357 11/15/21 0528 11/16/21 0503  WBC 10.9*  --  7.0 7.3  CREATININE 0.68  --  0.66 0.61  LATICACIDVEN  --  0.9  --   --      Estimated Creatinine Clearance: 106.4 mL/min (by C-G formula based on SCr of 0.61 mg/dL).    Allergies  Allergen Reactions   Amlodipine Swelling   Betadine [Povidone-Iodine]    Povidone Iodine Rash    Antimicrobials this admission: 7/11 Cefepime >> x 7 days 7/11 Vancomycin >> x 7 days  Microbiology results: 7/10 BCx: NG x 1 d 7/11 UCx: No growth 7/11 WoundCx: GPCs in pairs and GNR   Thank you for allowing pharmacy to be a part of this patient's care.   Elliot Gurney, PharmD Clinical Pharmacist  11/16/2021 7:36 AM

## 2021-11-16 NOTE — TOC Progression Note (Signed)
Transition of Care [facility]) - Progression Note    Patient Details  Name: Lisa Williams MRN: [id number] Date of Birth: 1960-10-31  Transition [facility] Care [facility]) CM/SW Contact  Caryn Section, RN Phone Number: 11/16/2021, 1:19 PM  Clinical Narrative:   As per note from [facility], they will assess patient for candidacy today.         Expected Discharge Plan and Services                                                 Social Determinants of Health (SDOH) Interventions    Readmission Risk Interventions     No data to display

## 2021-11-16 NOTE — Progress Notes (Signed)
PROGRESS NOTE    Lisa Williams  AST:[id number] DOB: Dec 02, 1960 DOA: 11/14/2021 PCP: Lauro Regulus, MD    Brief Narrative:  61 year old female with history of hypertension, multiple sclerosis with lower leg spasticity and weakness, independent at home with use of wheelchair and walker currently on [place] presented with extreme weakness unable to use her legs and painful swelling on her left groin for the last few days.  In the emergency room fairly stable.  I&D done by ER physician, started on antibiotics and also on high-dose IV steroids for suspected MS flare.  Admitted with neurology consultation.   Assessment & Plan:   Left groin abscess: I&D.  Daily dressing.  Blood cultures negative.  Local culture with Enterobacter.  Due to immunocompromised status, continue broad-spectrum IV antibiotics.  Local wound care.  Mobility.  Multiple sclerosis exacerbation: Due to #1.  Treated with high-dose IV steroids 1 g daily, neurology recommended 5 days of therapy.  Already improving.  Abnormal LFTs: Stable and chronic.  Essential hypertension: Stable on telmisartan.   DVT prophylaxis:   Lovenox   Code Status: Full code Family Communication: None Disposition Plan: Status is: Inpatient Remains inpatient appropriate because: IV antibiotics, IV steroids     Consultants:  Neurology  Procedures:  I&D left groin  Antimicrobials:  Vancomycin and cefepime 7/10--   Subjective: Patient seen and examined.  Moderate pain at the groin after dressing change.  She is able to move her legs and able to stand on her legs that is far much better than yesterday.  No other events.  Objective: Vitals:   11/16/21 0514 11/16/21 0810 11/16/21 1154 11/16/21 1315  BP: 138/63 132/69 (!) 93/55 129/63  Pulse: (!) 53 (!) 57 (!) 52 62  Resp:  18 18 18   Temp:  98.1 F (36.7 C) 98.1 F (36.7 C)   TempSrc:  Oral    SpO2:  96% 98% 100%  Weight:      Height:        Intake/Output  Summary (Last 24 hours) at 11/16/2021 1321 Last data filed at 11/16/2021 0515 Gross per 24 hour  Intake 240 ml  Output 300 ml  Net -60 ml   Filed Weights   11/15/21 0500  Weight: 119 kg    Examination:  General: Looks fairly comfortable. Cardiovascular: S1-S2 normal.  Regular rate rhythm. Respiratory: Bilateral clear. Gastrointestinal: No added sounds. Ext: No edema or cyanosis.  No deformities. Neuro: Alert oriented x4.  Normal upper extremity strength and sensation. Bilateral lower extremity 3/5, generalized weakness. Left groin with packed dressing, clean with no fluctuation.     Data Reviewed: I have personally reviewed following labs and imaging studies  CBC: Recent Labs  Lab 11/14/21 1758 11/15/21 0528 11/16/21 0503  WBC 10.9* 7.0 7.3  HGB 13.1 12.8 12.6  HCT 38.3 36.4 36.3  MCV 76.0* 76.0* 76.1*  PLT 256 240 254   Basic Metabolic Panel: Recent Labs  Lab 11/14/21 1758 11/15/21 0528 11/16/21 0503  NA 140  --  141  K 4.2  --  4.1  CL 106  --  113*  CO2 24  --  22  GLUCOSE 103*  --  138*  BUN 22*  --  25*  CREATININE 0.68 0.66 0.61  CALCIUM 9.1  --  9.2  MG  --   --  2.2   GFR: Estimated Creatinine Clearance: 106.4 mL/min (by C-G formula based on SCr of 0.61 mg/dL). Liver Function Tests: Recent Labs  Lab 11/14/21 1758  AST 52*  ALT 88*  ALKPHOS 146*  BILITOT 1.0  PROT 6.8  ALBUMIN 4.0   No results for input(s): "LIPASE", "AMYLASE" in the last 168 hours. No results for input(s): "AMMONIA" in the last 168 hours. Coagulation Profile: Recent Labs  Lab 11/14/21 2357  INR 1.1   Cardiac Enzymes: No results for input(s): "CKTOTAL", "CKMB", "CKMBINDEX", "TROPONINI" in the last 168 hours. BNP (last 3 results) No results for input(s): "PROBNP" in the last 8760 hours. HbA1C: No results for input(s): "HGBA1C" in the last 72 hours. CBG: No results for input(s): "GLUCAP" in the last 168 hours. Lipid Profile: No results for input(s): "CHOL",  "HDL", "LDLCALC", "TRIG", "CHOLHDL", "LDLDIRECT" in the last 72 hours. Thyroid Function Tests: No results for input(s): "TSH", "T4TOTAL", "FREET4", "T3FREE", "THYROIDAB" in the last 72 hours. Anemia Panel: No results for input(s): "VITAMINB12", "FOLATE", "FERRITIN", "TIBC", "IRON", "RETICCTPCT" in the last 72 hours. Sepsis Labs: Recent Labs  Lab 11/14/21 2357  LATICACIDVEN 0.9    Recent Results (from the past 240 hour(s))  Blood Culture (routine x 2)     Status: None (Preliminary result)   Collection Time: 11/14/21 11:57 PM   Specimen: BLOOD  Result Value Ref Range Status   Specimen Description BLOOD RIGHT ASSIST CONTROL  Final   Special Requests   Final    BOTTLES DRAWN AEROBIC AND ANAEROBIC Blood Culture adequate volume   Culture   Final    NO GROWTH 1 DAY Performed at [facility], [address] [place], [place] [address]    Report Status PENDING  Incomplete  Blood Culture (routine x 2)     Status: None (Preliminary result)   Collection Time: 11/14/21 11:57 PM   Specimen: BLOOD  Result Value Ref Range Status   Specimen Description BLOOD LEFT HAND  Final   Special Requests   Final    BOTTLES DRAWN AEROBIC AND ANAEROBIC Blood Culture adequate volume   Culture   Final    NO GROWTH 1 DAY Performed at [facility], [address] [place], [place] [address]    Report Status PENDING  Incomplete  Urine Culture     Status: None   Collection Time: 11/15/21  1:30 AM   Specimen: Urine, Random  Result Value Ref Range Status   Specimen Description   Final    URINE, RANDOM Performed at [facility], [address] [place], [place] [address]    Special Requests   Final    NONE Performed at [facility], [address], [place], [place] [address]    Culture   Final    NO GROWTH Performed at [facility], [address], [place], [place] [address]    Report Status 11/16/2021 FINAL  Final  Aerobic/Anaerobic Culture w Gram Stain  (surgical/deep wound)     Status: None (Preliminary result)   Collection Time: 11/15/21  1:30 AM   Specimen: Abscess  Result Value Ref Range Status   Specimen Description   Final    ABSCESS GROIN LEFT Performed at [facility], [address], [place], [place] [address]    Special Requests   Final    NONE Performed at [facility], [address] [place], [place] [address]    Gram Stain   Final    FEW WBC PRESENT,BOTH PMN AND MONONUCLEAR FEW GRAM POSITIVE COCCI IN PAIRS FEW GRAM NEGATIVE RODS    Culture   Final    CULTURE REINCUBATED FOR BETTER GROWTH Performed at  [facility], [address], [place], [place] [address]    Report Status PENDING  Incomplete         Radiology Studies: DG Chest Port 1 View  Result Date: 11/14/2021 CLINICAL DATA:  Questionable sepsis - evaluate for abnormality EXAM: PORTABLE CHEST 1 VIEW COMPARISON:  Radiograph 11/21/2020 FINDINGS: Chronic elevation of right hemidiaphragm. No acute airspace disease. Stable heart size and mediastinal contours. No pulmonary edema, pleural effusion or pneumothorax. No acute osseous abnormalities. IMPRESSION: Chronic elevation of the right hemidiaphragm. No acute abnormality. Electronically Signed   By: Narda Rutherford M.D.   On: 11/14/2021 23:56        Scheduled Meds:  baclofen  20 mg Oral TID   cholecalciferol  5,000 Units Oral Daily   enoxaparin (LOVENOX) injection  0.5 mg/kg Subcutaneous Q24H   gabapentin  100 mg Oral BID   irbesartan  75 mg Oral Daily   oxybutynin  10 mg Oral Daily   Siponimod Fumarate  2 mg Oral Daily   tiZANidine  4 mg Oral BID   Continuous Infusions:  ceFEPime (MAXIPIME) IV 2 g (11/16/21 0606)   methylPREDNISolone (SOLU-MEDROL) injection 1,000 mg (11/16/21 1127)   vancomycin 1,250 mg (11/16/21 0646)     LOS: 1 day    Time spent: 35 minutes    Dorcas Carrow, MD [facility] Pager [phone]

## 2021-11-16 NOTE — Progress Notes (Signed)
Inpatient Rehabilitation Admissions Coordinator   Rehab consult received. I contacted patient by phone and informed her that I will be onsite at [facility] late this afternoon between 4 and 5 pm for rehab assessment. She is in agreement.  Ottie Glazier, RN, MSN Rehab Admissions Coordinator [phone] 11/16/2021 12:10 PM

## 2021-11-16 NOTE — Progress Notes (Signed)
Physical Therapy Treatment Patient Details Name: Lisa Williams MRN: [id number] DOB: 04-Apr-1961 Today's Date: 11/16/2021   History of Present Illness Pt is a 61 y.o. female with PMH including HTN, MS symptomatic mostly for spasticity and lower extremity weakness, independent at home with wheelchair and walker and drives. Pt presented to the ED on 11/14/21 with concern for multiple sclerosis exacerbation. Pt underwent I&D in ED for left groin abscess.    PT Comments    Pt was long sitting in bed upon arriving. She agrees to session and is extremely motivated and pleasant throughout. Well known by our department from past admissions. Pt reports feeling better this date versus previous one. " I want to try to get to the chair." Pt did require mod assist to exit L side of bed. Sat EOB with supervision however required max assist to apply BLE AFOs prior to standing from elevated bed height with min assist. Lowered bed height to a little higher than standard height with mod assist required. She progressed to taking ~ 5 steps/away from EOB with min assist to lateral wt shift to allow opposite LE advancement. Endorses fatigue with minimal activity. At conclusion of session, pt was in recliner with BLEs elevated and call bell in reach. Will require +2 assistance to stand from lower recliner surface. Author will return at 1pm per request. Pt is great CIR candidate and extremely motivated to improve back to baseline.       Recommendations for follow up therapy are one component of a multi-disciplinary discharge planning process, led by the attending physician.  Recommendations may be updated based on patient status, additional functional criteria and insurance authorization.  Follow Up Recommendations  Acute inpatient rehab (3hours/day)     Assistance Recommended at Discharge Frequent or constant Supervision/Assistance  Patient can return home with the following A lot of help with walking and/or  transfers;A lot of help with bathing/dressing/bathroom;Assistance with cooking/housework;Assist for transportation;Help with stairs or ramp for entrance   Equipment Recommendations  None recommended by PT       Precautions / Restrictions Precautions Precautions: Fall Restrictions Weight Bearing Restrictions: No     Mobility  Bed Mobility Overal bed mobility: Needs Assistance Bed Mobility: Supine to Sit  Supine to sit: Mod assist  General bed mobility comments: increased time required + mod assist to achieve EOB short it.    Transfers Overall transfer level: Needs assistance Equipment used: Rolling walker (2 wheels) Transfers: Sit to/from Stand Sit to Stand: Min assist, Mod assist  General transfer comment: Pt stood EOB 2 x with min assist from elevated bed height but mod assist from slightly lower bed height.    Ambulation/Gait Ambulation/Gait assistance: Min assist Gait Distance (Feet): 5 Feet Assistive device: Rolling walker (2 wheels) (Bariatric) Gait Pattern/deviations: Step-to pattern Gait velocity: decreased     General Gait Details: BLEs AFO applied EOB with max assist prior to standing and taking ~ 5 steps/~5 ft with recliner follow. Pt fatigues quickly but put worth great effort throughout. Pt does require assistance to lateral wt shift to allow opposite LE advancement    Balance Overall balance assessment: Needs assistance Sitting-balance support: Feet supported, No upper extremity supported Sitting balance-Leahy Scale: Fair     Standing balance support: Reliant on assistive device for balance, During functional activity, Bilateral upper extremity supported Standing balance-Leahy Scale: Fair Standing balance comment: pt is reliant on RW for all standing activity      Cognition Arousal/Alertness: Awake/alert Behavior During Therapy: [facility] for tasks  assessed/performed Overall Cognitive Status: Within Functional Limits for tasks assessed        General  Comments: A&Ox4               Pertinent Vitals/Pain Pain Assessment Pain Assessment: 0-10 Pain Score: 3  Pain Location: L groin due to abscess Pain Descriptors / Indicators: Discomfort Pain Intervention(s): Limited activity within patient's tolerance, Monitored during session, Premedicated before session, Repositioned    Home Living Family/patient expects to be discharged to:: Private residence Living Arrangements: Parent;Other relatives Available Help at Discharge: Family Type of Home: House Home Access: Ramped entrance       Home Layout: Two level;Able to live on main level with bedroom/bathroom Home Equipment: Rolling Walker (2 wheels);Hospital bed;Wheelchair - manual;Tub bench          PT Goals (current goals can now be found in the care plan section) Acute Rehab PT Goals Patient Stated Goal: " get better at rehab so I can return home." Progress towards PT goals: Progressing toward goals    Frequency    7X/week      PT Plan Current plan remains appropriate       AM-PAC PT "6 Clicks" Mobility   Outcome Measure  Help needed turning from your back to your side while in a flat bed without using bedrails?: A Lot Help needed moving from lying on your back to sitting on the side of a flat bed without using bedrails?: A Lot Help needed moving to and from a bed to a chair (including a wheelchair)?: A Lot Help needed standing up from a chair using your arms (e.g., wheelchair or bedside chair)?: A Lot Help needed to walk in hospital room?: A Lot Help needed climbing 3-5 steps with a railing? : Total 6 Click Score: 11    End of Session Equipment Utilized During Treatment: Gait belt Activity Tolerance: Patient tolerated treatment well;Patient limited by fatigue Patient left: in chair;with call bell/phone within reach;with chair alarm set;with family/visitor present;with nursing/sitter in room Nurse Communication: Mobility status PT Visit Diagnosis: Difficulty in  walking, not elsewhere classified (R26.2);Muscle weakness (generalized) (M62.81);Other abnormalities of gait and mobility (R26.89)     Time: 1022-1040 PT Time Calculation (min) (ACUTE ONLY): 18 min  Charges:  $Therapeutic Activity: 8-22 mins                     Jetta Lout PTA 11/16/21, 12:30 PM

## 2021-11-17 ENCOUNTER — Ambulatory Visit: Payer: PPO

## 2021-11-17 DIAGNOSIS — G35 Multiple sclerosis: Secondary | ICD-10-CM | POA: Diagnosis not present

## 2021-11-17 LAB — BASIC METABOLIC PANEL
Anion gap: 5 (ref 5–15)
BUN: 25 mg/dL — ABNORMAL HIGH (ref 6–20)
CO2: 24 mmol/L (ref 22–32)
Calcium: 9 mg/dL (ref 8.9–10.3)
Chloride: 112 mmol/L — ABNORMAL HIGH (ref 98–111)
Creatinine, Ser: 0.75 mg/dL (ref 0.44–1.00)
GFR, Estimated: >60 mL/min (ref >60)
Glucose, Bld: 135 mg/dL — ABNORMAL HIGH (ref 70–99)
Potassium: 4.3 mmol/L (ref 3.5–5.1)
Sodium: 141 mmol/L (ref 135–145)

## 2021-11-17 LAB — CBC
HCT: 37.6 % (ref 36.0–46.0)
Hemoglobin: 12.8 g/dL (ref 12.0–15.0)
MCH: 26 pg (ref 26.0–34.0)
MCHC: 34 g/dL (ref 30.0–36.0)
MCV: 76.4 fL — ABNORMAL LOW (ref 80.0–100.0)
Platelets: 274 10*3/uL (ref 150–400)
RBC: 4.92 MIL/uL (ref 3.87–5.11)
RDW: 14.3 % (ref 11.5–15.5)
WBC: 9.9 10*3/uL (ref 4.0–10.5)
nRBC: 0 % (ref 0.0–0.2)

## 2021-11-17 LAB — MAGNESIUM: Magnesium: 2.2 mg/dL (ref 1.7–2.4)

## 2021-11-17 MED ORDER — POLYETHYLENE GLYCOL 3350 17 G PO PACK
17.0000 g | PACK | Freq: Every day | ORAL | Status: DC
  Administered 2021-11-17 – 2021-11-18 (×2): 17 g via ORAL
  Filled 2021-11-17 (×3): qty 1

## 2021-11-17 MED ORDER — RISAQUAD PO CAPS
2.0000 | ORAL_CAPSULE | Freq: Three times a day (TID) | ORAL | Status: DC
  Administered 2021-11-17 – 2021-11-21 (×14): 2 via ORAL
  Filled 2021-11-17 (×14): qty 2

## 2021-11-17 MED ORDER — FAMOTIDINE 20 MG PO TABS
20.0000 mg | ORAL_TABLET | Freq: Two times a day (BID) | ORAL | Status: DC
  Administered 2021-11-17 – 2021-11-21 (×9): 20 mg via ORAL
  Filled 2021-11-17 (×9): qty 1

## 2021-11-17 NOTE — Progress Notes (Signed)
Physical Therapy Treatment Patient Details Name: Lisa Williams MRN: [id number] DOB: 01/06/1961 Today's Date: 11/17/2021   History of Present Illness Pt is a 61 y.o. female with PMH including HTN, MS symptomatic mostly for spasticity and lower extremity weakness, independent at home with wheelchair and walker and drives. Pt presented to the ED on 11/14/21 with concern for multiple sclerosis exacerbation. Pt underwent I&D in ED for left groin abscess.    PT Comments    Pt is extremely motivated and cooperative. Endorsing feeling much better than earlier this date (dealt with nausea + severe fatigue). Eager to attempt OOB and ambulation. PT/OT co treat 2/2 to pt requiring +2 assistance for ADLs/transfers/gait. She required +1 assistance to exit bed and  to return to bed after OOB activity. Pt requires +2 assistance to stand and ambulate very short distances. At baseline, pt is able to ambulate > 100 ft with +1 assistance RW. Currently only able to ambulate ~ 12 ft with heavy reliance on RW + BLE AFOs and +2 assistance for safety (chair follow during gait).  She is progressing well but will greatly benefit from AIR at DC to address deficits while maximizing independence with ADLs. She will require PT/OT going forward to progress to PLOF.     Recommendations for follow up therapy are one component of a multi-disciplinary discharge planning process, led by the attending physician.  Recommendations may be updated based on patient status, additional functional criteria and insurance authorization.  Follow Up Recommendations  Acute inpatient rehab (3hours/day) (very motivated to return to PLOF. Pt would tolerate more aggressive PT/OT post acute hospitalization)     Assistance Recommended at Discharge Frequent or constant Supervision/Assistance  Patient can return home with the following Assistance with cooking/housework;Assist for transportation;Help with stairs or ramp for entrance;Two people to  help with walking and/or transfers;Two people to help with bathing/dressing/bathroom   Equipment Recommendations  None recommended by PT       Precautions / Restrictions Precautions Precautions: Fall Restrictions Weight Bearing Restrictions: No     Mobility  Bed Mobility Overal bed mobility: Needs Assistance Bed Mobility: Supine to Sit     Supine to sit: Min assist, Mod assist Sit to supine: Max assist   General bed mobility comments: pt requires assistance to exiting bed and returning to bed. Assitance required for BLEs in and out    Transfers Overall transfer level: Needs assistance Equipment used: Rolling walker (2 wheels) (Bariatric) Transfers: Sit to/from Stand Sit to Stand: Mod assist, +2 physical assistance, +2 safety/equipment, From elevated surface           General transfer comment: Pt requires +2 assistance to stand from elevated bed height and from lower recliner surface. Author had to stack pillows to elevate seat height of recliner.    Ambulation/Gait Ambulation/Gait assistance: +2 safety/equipment, Min assist (chair) Gait Distance (Feet): 12 Feet Assistive device: Rolling walker (2 wheels) (Bariatric/ BLEs for AFOs) Gait Pattern/deviations: Step-to pattern Gait velocity: decreased     General Gait Details: pt was able to ambulate 2 x 12 ft with +2 assistance and chair follow for additional safety. At baseline, pt was ambulating over 100 ft with RW with one assist.      Balance Overall balance assessment: Needs assistance Sitting-balance support: Feet supported, No upper extremity supported Sitting balance-Leahy Scale: Fair     Standing balance support: Reliant on assistive device for balance, During functional activity, Bilateral upper extremity supported Standing balance-Leahy Scale: Poor Standing balance comment: pt is reliant on  RW for all standing activity         Cognition Arousal/Alertness: Awake/alert Behavior During Therapy: WFL  for tasks assessed/performed Overall Cognitive Status: Within Functional Limits for tasks assessed      General Comments: A&Ox4           General Comments General comments (skin integrity, edema, etc.): Pt remains extremely motivated but a little down she is not at baseline. Is motivated to improve and excited to return of PLOF      Pertinent Vitals/Pain Pain Assessment Pain Assessment: No/denies pain Pain Score: 0-No pain Pain Location: L groin due to abscess Pain Descriptors / Indicators: Discomfort Pain Intervention(s): Limited activity within patient's tolerance, Monitored during session, Repositioned     PT Goals (current goals can now be found in the care plan section) Acute Rehab PT Goals Patient Stated Goal: " I want to go to rehab so I can return to how I was before." Progress towards PT goals: Progressing toward goals    Frequency    7X/week      PT Plan Current plan remains appropriate       AM-PAC PT "6 Clicks" Mobility   Outcome Measure  Help needed turning from your back to your side while in a flat bed without using bedrails?: A Lot Help needed moving from lying on your back to sitting on the side of a flat bed without using bedrails?: A Lot Help needed moving to and from a bed to a chair (including a wheelchair)?: Total Help needed standing up from a chair using your arms (e.g., wheelchair or bedside chair)?: Total Help needed to walk in hospital room?: Total Help needed climbing 3-5 steps with a railing? : Total 6 Click Score: 8    End of Session Equipment Utilized During Treatment: Gait belt;Other (comment) (BLE AFOs) Activity Tolerance: Patient tolerated treatment well;Patient limited by fatigue Patient left: in bed;with call bell/phone within reach;with bed alarm set Nurse Communication: Mobility status PT Visit Diagnosis: Difficulty in walking, not elsewhere classified (R26.2);Muscle weakness (generalized) (M62.81);Other abnormalities of  gait and mobility (R26.89)     Time: 7893-8101 PT Time Calculation (min) (ACUTE ONLY): 26 min  Charges:  $Gait Training: 8-22 mins                     Jetta Lout PTA 11/17/21, 3:11 PM

## 2021-11-17 NOTE — Progress Notes (Signed)
Dr Jerral Ralph made aware that pt is on tele monitor and running sinus brady, 40-50s while awake and into 30s while asleep, per pt this is normal for her, per Dr Jerral Ralph ok to discontinue tele monitor that is on pt

## 2021-11-17 NOTE — Progress Notes (Signed)
PROGRESS NOTE    Lisa Williams  TLX:[id number] DOB: April 02, 1961 DOA: 11/14/2021 PCP: Lauro Regulus, MD    Brief Narrative:  61 year old female with history of hypertension, multiple sclerosis with lower leg spasticity and weakness, independent at home with use of wheelchair and walker currently on [place] presented with extreme weakness unable to use her legs and painful swelling on her left groin for the last few days.  In the emergency room fairly stable.  I&D done by ER physician, started on antibiotics and also on high-dose IV steroids for suspected MS flare.  Admitted with neurology consultation.   Assessment & Plan:   Left groin abscess: s/p I&D.  Daily dressing and packing.  Blood cultures negative.  Local culture with Enterobacter.  Final sensitivity pending.  Due to immunocompromised status, continue broad-spectrum IV antibiotics.  Local wound care.  Mobility.  Multiple sclerosis exacerbation: Due to #1.  Treated with high-dose IV steroids 1 g daily, neurology recommended 5 days of therapy.  Already improving.  Appropriate for rehab.  Patient can be transferred to inpatient rehab if they can administer IV steroids for total of 5 days.  Abnormal LFTs: Stable and chronic.  Essential hypertension: Stable on telmisartan.  Bradycardia: Sinus bradycardia.  Asymptomatic.  Heart rate more than 40 at rest, occasionally less than 40 at sleep but never less than 30.  Monitor.  Not on any rate control medications.   DVT prophylaxis:   Lovenox   Code Status: Full code Family Communication: None Disposition Plan: Status is: Inpatient Remains inpatient appropriate because: IV antibiotics, IV steroids     Consultants:  Neurology  Procedures:  I&D left groin  Antimicrobials:  Vancomycin and cefepime 7/10--   Subjective:  Patient seen and examined.  No new events.  Legs are weak but motivated to walk.  Objective: Vitals:   11/16/21 2332 11/17/21 0428 11/17/21  0746 11/17/21 1054  BP: 127/62 127/67 132/68   Pulse: (!) 55 (!) 43 (!) 46   Resp: 16 18 20 19   Temp: 98.7 F (37.1 C) 98 F (36.7 C) 97.7 F (36.5 C)   TempSrc:   Oral   SpO2: 96% 100% 98%   Weight:      Height:        Intake/Output Summary (Last 24 hours) at 11/17/2021 1354 Last data filed at 11/17/2021 1028 Gross per 24 hour  Intake 360 ml  Output 525 ml  Net -165 ml   Filed Weights   11/15/21 0500  Weight: 119 kg    Examination:  General: Looks fairly comfortable. Cardiovascular: S1-S2 normal.  Regular rate rhythm. Respiratory: Bilateral clear.  No added sounds. Gastrointestinal: Soft.  Nontender. Ext: No edema or cyanosis.  No deformities. Neuro: Alert oriented x4.  Normal upper extremity strength and sensation. Bilateral lower extremity 3/5, generalized weakness. Left groin with packed dressing, dry dressing.  No surrounding fluctuation.     Data Reviewed: I have personally reviewed following labs and imaging studies  CBC: Recent Labs  Lab 11/14/21 1758 11/15/21 0528 11/16/21 0503 11/17/21 0626  WBC 10.9* 7.0 7.3 9.9  HGB 13.1 12.8 12.6 12.8  HCT 38.3 36.4 36.3 37.6  MCV 76.0* 76.0* 76.1* 76.4*  PLT 256 240 254 274   Basic Metabolic Panel: Recent Labs  Lab 11/14/21 1758 11/15/21 0528 11/16/21 0503 11/17/21 0626  NA 140  --  141 141  K 4.2  --  4.1 4.3  CL 106  --  113* 112*  CO2 24  --  22  24  GLUCOSE 103*  --  138* 135*  BUN 22*  --  25* 25*  CREATININE 0.68 0.66 0.61 0.75  CALCIUM 9.1  --  9.2 9.0  MG  --   --  2.2 2.2   GFR: Estimated Creatinine Clearance: 106.4 mL/min (by C-G formula based on SCr of 0.75 mg/dL). Liver Function Tests: Recent Labs  Lab 11/14/21 1758  AST 52*  ALT 88*  ALKPHOS 146*  BILITOT 1.0  PROT 6.8  ALBUMIN 4.0   No results for input(s): "LIPASE", "AMYLASE" in the last 168 hours. No results for input(s): "AMMONIA" in the last 168 hours. Coagulation Profile: Recent Labs  Lab 11/14/21 2357  INR 1.1    Cardiac Enzymes: No results for input(s): "CKTOTAL", "CKMB", "CKMBINDEX", "TROPONINI" in the last 168 hours. BNP (last 3 results) No results for input(s): "PROBNP" in the last 8760 hours. HbA1C: No results for input(s): "HGBA1C" in the last 72 hours. CBG: No results for input(s): "GLUCAP" in the last 168 hours. Lipid Profile: No results for input(s): "CHOL", "HDL", "LDLCALC", "TRIG", "CHOLHDL", "LDLDIRECT" in the last 72 hours. Thyroid Function Tests: No results for input(s): "TSH", "T4TOTAL", "FREET4", "T3FREE", "THYROIDAB" in the last 72 hours. Anemia Panel: No results for input(s): "VITAMINB12", "FOLATE", "FERRITIN", "TIBC", "IRON", "RETICCTPCT" in the last 72 hours. Sepsis Labs: Recent Labs  Lab 11/14/21 2357  LATICACIDVEN 0.9    Recent Results (from the past 240 hour(s))  Blood Culture (routine x 2)     Status: None (Preliminary result)   Collection Time: 11/14/21 11:57 PM   Specimen: BLOOD  Result Value Ref Range Status   Specimen Description BLOOD RIGHT ASSIST CONTROL  Final   Special Requests   Final    BOTTLES DRAWN AEROBIC AND ANAEROBIC Blood Culture adequate volume   Culture   Final    NO GROWTH 2 DAYS Performed at [facility], [address], [place], [place] [address]    Report Status PENDING  Incomplete  Blood Culture (routine x 2)     Status: None (Preliminary result)   Collection Time: 11/14/21 11:57 PM   Specimen: BLOOD  Result Value Ref Range Status   Specimen Description BLOOD LEFT HAND  Final   Special Requests   Final    BOTTLES DRAWN AEROBIC AND ANAEROBIC Blood Culture adequate volume   Culture   Final    NO GROWTH 2 DAYS Performed at [facility], [address], [place], [place] [address]    Report Status PENDING  Incomplete  Urine Culture     Status: None   Collection Time: 11/15/21  1:30 AM   Specimen: Urine, Random  Result Value Ref Range Status   Specimen Description   Final    URINE, RANDOM Performed at [facility], [address] [place], [place] [address]    Special Requests   Final    NONE Performed at [facility], [address] [place], [place] [address]    Culture   Final    NO GROWTH Performed at [facility], [address], [place], [place] [address]    Report Status 11/16/2021 FINAL  Final  Aerobic/Anaerobic Culture w Gram Stain (surgical/deep wound)     Status: None (Preliminary result)   Collection Time: 11/15/21  1:30 AM   Specimen: Abscess  Result Value Ref Range Status   Specimen Description   Final    ABSCESS GROIN LEFT Performed at [facility], [address], [place], [place] [address]  Special Requests   Final    NONE Performed at [facility], [address] [place], [place] [address]    Gram Stain   Final    FEW WBC PRESENT,BOTH PMN AND MONONUCLEAR FEW GRAM POSITIVE COCCI IN PAIRS FEW GRAM NEGATIVE RODS    Culture   Final    RARE STAPHYLOCOCCUS LUGDUNENSIS CULTURE REINCUBATED FOR BETTER GROWTH Performed at [facility], [address], [place], [place] [address]    Report Status PENDING  Incomplete         Radiology Studies: No results found.      Scheduled Meds:  acidophilus  2 capsule Oral TID   baclofen  20 mg Oral TID   cholecalciferol  5,000 Units Oral Daily   enoxaparin (LOVENOX) injection  0.5 mg/kg Subcutaneous Q24H   famotidine  20 mg Oral BID   gabapentin  100 mg Oral BID   irbesartan  75 mg Oral Daily   oxybutynin  10 mg Oral Daily   polyethylene glycol  17 g Oral Daily   Siponimod Fumarate  2 mg Oral Daily   tiZANidine  4 mg Oral BID   Continuous Infusions:  ceFEPime (MAXIPIME) IV 2 g (11/17/21 1346)   methylPREDNISolone (SOLU-MEDROL) injection 1,000 mg (11/17/21 0834)   vancomycin 1,250 mg (11/17/21 0617)     LOS: 2 days    Time spent: 35 minutes    Dorcas Carrow, MD [facility] Pager [phone]

## 2021-11-17 NOTE — PMR Pre-admission (Shared)
PMR Admission Coordinator Pre-Admission Assessment  Patient: Lisa Williams is an 61 y.o., female MRN: [id number] DOB: [birth year] Height: '5\' 11"'  (180.3 cm) Weight: 119 kg  Insurance Information HMO:     PPO: yes     PCP:      IPA:      80/20:      OTHER:  PRIMARY: [employer]      Policy#: [id number]      Subscriber: pt CM Name: ***      Phone#: ***     Fax#: EPIC access Pre-Cert#: [id number]      Employer:  Benefits:  Phone #: [phone] option 1     Name: 7/13 Eff. Date: 05/08/2016     Deduct: none      Out of Pocket Max: $3200      Life Max: none CIR: $295 co pay per day days 1 until 6      SNF: no copay per day days 1 until 20; $184 co pay per day days 21 until 100 Outpatient: $15 per visit     Co-Pay: vitis per medical neccesity Home Health: 100%      Co-Pay: visit pe rmedical necceisty DME: 80%     Co-Pay: 20% Providers: in network  SECONDARY: Medicaid [place] [facility]      Policy#: [id number]  Financial Counselor:       Phone#:   The "Data Collection Information Summary" for patients in Inpatient Rehabilitation Facilities with attached "Privacy Act [place] Records" was provided and verbally reviewed with: Patient  Emergency Contact Information Contact Information     Name Relation Home Work Mobile   [place] Sister [phone]   Lapierre,Sharon Sister [phone]    Aris Everts [place] Mother [phone]         Current Medical History  Patient Admitting Diagnosis: MS exacerbation  History of Present Illness: ***    Patient's medical record from [facility] has been reviewed by the rehabilitation admission coordinator and physician.  Past Medical History  Past Medical History:  Diagnosis Date   Abdominal pain, right upper quadrant    Back pain    Calculus of kidney 12/09/2013   Chronic back pain    unspecified   Chronic left shoulder pain 0/93/2671    Complication of anesthesia    Functional disorder of bladder    other   Galactorrhea 11/26/2014   Chronic    Hereditary and idiopathic neuropathy 08/19/2013   HPV test positive    Hypercholesteremia 08/19/2013   Hypertension    Incomplete bladder emptying    Microscopic hematuria    MS (multiple sclerosis) (HCC)    Muscle spasticity 05/21/2014   Nonspecific findings on examination of urine    other   Osteopenia    PONV (postoperative nausea and vomiting)    Status post laparoscopic supracervical hysterectomy 11/26/2014   Tobacco user 11/26/2014   Wrist fracture     Has the patient had major surgery during 100 days prior to admission? No  Family History   family history includes Breast cancer in her maternal aunt; Diabetes in her maternal aunt and maternal grandmother; Heart failure in her father; Hyperlipidemia in her mother; Hypertension in her father, mother, sister, sister, and sister; Kidney disease in her father; Liver cancer in her maternal grandmother; Nephrolithiasis in her father; Osteoarthritis in her mother; Ovarian cancer in her paternal grandmother; Prostate cancer in her father; Sickle cell trait in her sister; Supraventricular tachycardia in her  sister.  Current Medications  Current Facility-Administered Medications:    acetaminophen (TYLENOL) tablet 650 mg, 650 mg, Oral, Q6H PRN, 650 mg at 11/15/21 2145 **OR** acetaminophen (TYLENOL) suppository 650 mg, 650 mg, Rectal, Q6H PRN, Athena Masse, MD   acidophilus (RISAQUAD) capsule 2 capsule, 2 capsule, Oral, TID, Barb Merino, MD, 2 capsule at 11/17/21 2038   baclofen (LIORESAL) tablet 20 mg, 20 mg, Oral, TID, Judd Gaudier V, MD, 20 mg at 11/17/21 2037   ceFEPIme (MAXIPIME) 2 g in sodium chloride 0.9 % 100 mL IVPB, 2 g, Intravenous, Q8H, Belue, Alver Sorrow, RPH, Last Rate: 200 mL/hr at 11/17/21 2138, 2 g at 11/17/21 2138   cholecalciferol (VITAMIN D3) tablet 5,000 Units, 5,000 Units, Oral, Daily, Enzo Bi, MD, 5,000 Units  at 11/17/21 0811   enoxaparin (LOVENOX) injection 60 mg, 0.5 mg/kg, Subcutaneous, Q24H, Judd Gaudier V, MD, 60 mg at 11/17/21 0811   famotidine (PEPCID) tablet 20 mg, 20 mg, Oral, BID, Barb Merino, MD, 20 mg at 11/17/21 2037   gabapentin (NEURONTIN) capsule 100 mg, 100 mg, Oral, BID, Judd Gaudier V, MD, 100 mg at 11/17/21 2037   irbesartan (AVAPRO) tablet 75 mg, 75 mg, Oral, Daily, Judd Gaudier V, MD, 75 mg at 11/17/21 0810   methylPREDNISolone sodium succinate (SOLU-MEDROL) 1,000 mg in sodium chloride 0.9 % 50 mL IVPB, 1,000 mg, Intravenous, Daily, Enzo Bi, MD, Stopped at 11/17/21 0934   morphine (PF) 2 MG/ML injection 2 mg, 2 mg, Intravenous, Daily PRN, Barb Merino, MD, 2 mg at 11/17/21 0538   ondansetron (ZOFRAN) tablet 4 mg, 4 mg, Oral, Q6H PRN, 4 mg at 11/17/21 1009 **OR** ondansetron (ZOFRAN) injection 4 mg, 4 mg, Intravenous, Q6H PRN, Athena Masse, MD   oxybutynin (DITROPAN-XL) 24 hr tablet 10 mg, 10 mg, Oral, Daily, Enzo Bi, MD, 10 mg at 11/17/21 0811   polyethylene glycol (MIRALAX / GLYCOLAX) packet 17 g, 17 g, Oral, Daily, Ghimire, Kuber, MD, 17 g at 11/17/21 1343   Siponimod Fumarate TABS 2 mg, 2 mg, Oral, Daily, Judd Gaudier V, MD, 2 mg at 11/17/21 6599   tiZANidine (ZANAFLEX) tablet 4 mg, 4 mg, Oral, BID, Enzo Bi, MD, 4 mg at 11/17/21 2042   vancomycin (VANCOREADY) IVPB 1250 mg/250 mL, 1,250 mg, Intravenous, Q12H, Belue, Alver Sorrow, RPH, Last Rate: 166.7 mL/hr at 11/17/21 1718, 1,250 mg at 11/17/21 1718  Patients Current Diet:  Diet Order             Diet regular Room service appropriate? Yes; Fluid consistency: Thin  Diet effective now                   Precautions / Restrictions Precautions Precautions: Fall Restrictions Weight Bearing Restrictions: No   Has the patient had 2 or more falls or a fall with injury in the past year? No  Prior Activity Level Community (5-7x/wk): MOD I with RW, AFOs, 100 feet at baseline. Used manual w/c in community;  drove with adaptive hand controlled SUV  Prior Functional Level Self Care: Did the patient need help bathing, dressing, using the toilet or eating? Independent  Indoor Mobility: Did the patient need assistance with walking from room to room (with or without device)? Independent  Stairs: Did the patient need assistance with internal or external stairs (with or without device)? Independent  Functional Cognition: Did the patient need help planning regular tasks such as shopping or remembering to take medications? Independent  Patient Information Are you of Hispanic, Latino/a,or Spanish origin?: A. No,  not of Hispanic, Latino/a, or Spanish origin What is your race?: B. Black or African American Do you need or want an interpreter to communicate with a doctor or health care staff?: 0. No  Patient's Response To:  Health Literacy and Transportation Is the patient able to respond to health literacy and transportation needs?: Yes Health Literacy - How often do you need to have someone help you when you read instructions, pamphlets, or other written material from your doctor or pharmacy?: Never In the past 12 months, has lack of transportation kept you from medical appointments or from getting medications?: No In the past 12 months, has lack of transportation kept you from meetings, work, or from getting things needed for daily living?: No  Development worker, international aid / [place] Devices/Equipment: Primary school teacher, Brace (specify type), Wheelchair, Environmental consultant (specify type) (rolling walker) Home Equipment: Conservation officer, nature (2 wheels), Hospital bed, Wheelchair - manual, Tub bench  Prior Device Use: Indicate devices/aids used by the patient prior to current illness, exacerbation or injury? Manual wheelchair, Walker, and Orthotics/Prosthetics  Current Functional Level Cognition  Overall Cognitive Status: Within Functional Limits for tasks assessed Orientation Level: Oriented X4 General  Comments: A&Ox4    Extremity Assessment (includes Sensation/Coordination)  Upper Extremity Assessment: Overall WFL for tasks assessed  Lower Extremity Assessment: Generalized weakness    ADLs  Overall ADL's : Needs assistance/impaired Upper Body Dressing : Minimal assistance, Sitting Upper Body Dressing Details (indicate cue type and reason): gown Lower Body Dressing: Moderate assistance Lower Body Dressing Details (indicate cue type and reason): AFOs and shoes Toilet Transfer: Moderate assistance Toilet Transfer Details (indicate cue type and reason): simulated General ADL Comments: supine>sit with MIN A, STS with MOD-MAX A +1 with RW,therapist blocking knees; grooming with SET UP, boost in bed with MOD I with bed adjusted accordingly    Mobility  Overal bed mobility: Needs Assistance Bed Mobility: Supine to Sit Supine to sit: Min assist, Mod assist Sit to supine: Max assist General bed mobility comments: pt requires assistance to exiting bed and returning to bed. Assitance required for BLEs in and out    Transfers  Overall transfer level: Needs assistance Equipment used: Rolling walker (2 wheels) Transfers: Sit to/from Stand Sit to Stand: Mod assist, +2 physical assistance, +2 safety/equipment, From elevated surface General transfer comment: step by step vcs for technique, MOD A from lower surface with RW    Ambulation / Gait / Stairs / Wheelchair Mobility  Ambulation/Gait Ambulation/Gait assistance: +2 safety/equipment, Min assist (chair) Gait Distance (Feet): 12 Feet Assistive device: Rolling walker (2 wheels) (Bariatric/ BLEs for AFOs) Gait Pattern/deviations: Step-to pattern General Gait Details: pt was able to ambulate 2 x 12 ft with +2 assistance and chair follow for additional safety. At baseline, pt was ambulating over 100 ft with RW with one assist. Gait velocity: decreased    Posture / Balance Dynamic Sitting Balance Sitting balance - Comments: Pt fair-good static  sitting, fair dynamic sitting balance when donning AFOs and shoes Balance Overall balance assessment: Needs assistance Sitting-balance support: Feet supported, No upper extremity supported Sitting balance-Leahy Scale: Fair Sitting balance - Comments: Pt fair-good static sitting, fair dynamic sitting balance when donning AFOs and shoes Standing balance support: Reliant on assistive device for balance, During functional activity, Bilateral upper extremity supported Standing balance-Leahy Scale: Poor Standing balance comment: pt is reliant on RW for all standing activity    Special needs/care consideration    Previous Home Environment  Living Arrangements:  (Mom and sister)  Lives With: Family Available Help at Discharge: Family, Available 24 hours/day Type of Home: House Home Layout: Two level, Able to live on main level with bedroom/bathroom Alternate Level Stairs-Number of Steps: Pt does not go upstairs; unable to complete stairs at baseline. Home Access: Ramped entrance Bathroom Shower/Tub: Tub/shower unit Bathroom Toilet: Handicapped height Bathroom Accessibility: Yes [place]: No Type of Home Care Services: Homehealth aide Additional Comments: Pt has ramped entrance into home in garage. Pt utilizes RW for car transfers, but wheelchair to get in/out of home. Pt ambulatory with RW at baseline. Reported handicap accessible bathroom set up, and pt lives on 1st floor unable to ascend stairs at baseline.  Discharge Living Setting Plans for Discharge Living Setting: Patient's home, Lives with (comment) (Mom and sister) Type of Home at Discharge: House Discharge Home Layout: Two level, Able to live on main level with bedroom/bathroom Discharge Home Access: [place] entrance Discharge Bathroom Shower/Tub: Tub/shower unit Discharge Bathroom Toilet: Handicapped height Discharge Bathroom Accessibility: Yes How Accessible: Accessible via walker Does the patient have any problems  obtaining your medications?: No  Was receiving OP therapy at [place]: sister Anticipated Caregiver: sister Anticipated Caregiver's Contact Information: see contacts Ability/Limitations of Caregiver: no limitations Caregiver Availability: 24/7 Discharge Plan Discussed with Primary Caregiver: Yes Is Caregiver In Agreement with Plan?: Yes Does Caregiver/Family have Issues with Lodging/Transportation while Pt is in Rehab?: No  Goals Patient/Family Goal for Rehab: Mod I to supervision with PT and OT Expected length of stay: ELOS 7 to 10 days Pt/Family Agrees to Admission and willing to participate: Yes Program Orientation Provided & Reviewed with Pt/Caregiver Including Roles  & Responsibilities: Yes  Decrease burden of Care through IP rehab admission: n/a  Possible need for SNF placement upon discharge: not antiicpated  Patient Condition: I have reviewed medical records from [facility], spoken with CM, and patient. I met with patient at the bedside for inpatient rehabilitation assessment.  Patient will benefit from ongoing PT and OT, can actively participate in 3 hours of therapy a day 5 days of the week, and can make measurable gains during the admission.  Patient will also benefit from the coordinated team approach during an Inpatient Acute Rehabilitation admission.  The patient will receive intensive therapy as well as Rehabilitation physician, nursing, social worker, and care management interventions.  Due to bladder management, bowel management, safety, skin/wound care, disease management, medication administration, pain management, and patient education the patient requires 24 hour a day rehabilitation nursing.  The patient is currently *** with mobility and basic ADLs.  Discharge setting and therapy post discharge at home with outpatient is anticipated.  Patient has agreed to participate in the Acute Inpatient Rehabilitation Program and will admit  today.  Preadmission Screen Completed By:  Cleatrice Burke, 11/17/2021 10:08 PM ______________________________________________________________________   Discussed status with Dr. Marland Kitchen on *** at *** and received approval for admission today.  Admission Coordinator:  Cleatrice Burke, RN, time Marland KitchenSudie Grumbling ***   Assessment/Plan: Diagnosis: Does the need for close, 24 hr/day Medical supervision in concert with the patient's rehab needs make it unreasonable for this patient to be served in a less intensive setting? {yes_no_potentially:3041433} Co-Morbidities requiring supervision/potential complications: *** Due to {due BT:5176160}, does the patient require 24 hr/day rehab nursing? {yes_no_potentially:3041433} Does the patient require coordinated care of a physician, rehab nurse, PT, OT, and SLP to address physical and functional deficits in the context of the above medical diagnosis(es)? {yes_no_potentially:3041433} Addressing deficits in the following areas: {  deficits:3041436} Can the patient actively participate in an intensive therapy program of at least 3 hrs of therapy 5 days a week? {yes_no_potentially:3041433} The potential for patient to make measurable gains while on inpatient rehab is {potential:3041437} Anticipated functional outcomes upon discharge from inpatient rehab: {functional outcomes:304600100} PT, {functional outcomes:304600100} OT, {functional outcomes:304600100} SLP Estimated rehab length of stay to reach the above functional goals is: *** Anticipated discharge destination: {anticipated dc setting:21604} 10. Overall Rehab/Functional Prognosis: {potential:3041437}   MD Signature: ***

## 2021-11-17 NOTE — TOC Progression Note (Signed)
Transition of Care [facility]) - Progression Note    Patient Details  Name: Lisa Williams MRN: [id number] Date of Birth: 05-12-1960  [facility] CM/SW Contact  Caryn Section, RN Phone Number: 11/17/2021, 10:15 AM  Clinical Narrative:   CIR to re evaluate after solumedrol and potentially take patient Friday afternoon.         Expected Discharge Plan and Services                                                 Social Determinants of Health (SDOH) Interventions    Readmission Risk Interventions     No data to display

## 2021-11-17 NOTE — Progress Notes (Signed)
Inpatient Rehabilitation Admissions Coordinator   I will begin Auth with [employer] for possible admit 7/14 pending insurance approval and medical readiness after her 4th dose solumedrol.  Ottie Glazier, RN, MSN Rehab Admissions Coordinator [phone] 11/17/2021 2:07 PM

## 2021-11-17 NOTE — Progress Notes (Signed)
  Inpatient Rehabilitation Admissions Coordinator ( late entry for 7/12)  Met with patient on 7/12 at 1730 at bedside for rehab assessment. We discussed goals and expectations of a possible CIR admit. She prefers CIR for rehab.  She has assist of sister and Mom at home but Mod I at home prior to admit. Very independent at baseline  with RW short distances and manual Wheelchair for cooking and in community. Has adaptive SUV with hand controls that she is Mod I in the community. Very active and motivated to return to baseline of Mod I  with adls and ambulation. As patient has had 2 doses of solumedrol and progressing with rehab, I await 3rd dose today, 7/13 and then to assess therapy progression. If she is determined to have ongoing needs for AIR level rehab today, I will open case with [facility] team advantage for possible CIR/AIR level rehab Friday afternoon after 4 th dose solumedrol if approved by payor. Patient is in agreement. Acute team made aware on 7/12 via secure chat of plan of care fo potential CIR/AIR level rehab.  Please call me with any questions.  Danne Baxter, RN, MSN Rehab Admissions Coordinator [phone]

## 2021-11-17 NOTE — Progress Notes (Signed)
Occupational Therapy Treatment Patient Details Name: Lisa Williams MRN: [id number] DOB: 1960-06-29 Today's Date: 11/17/2021   History of present illness Pt is a 61 y.o. female with PMH including HTN, MS symptomatic mostly for spasticity and lower extremity weakness, independent at home with wheelchair and walker and drives. Pt presented to the ED on 11/14/21 with concern for multiple sclerosis exacerbation. Pt underwent I&D in ED for left groin abscess.   OT comments  Chart reviewed, pt greeted in bed agreeable to OT tx session. Co-tx completed with PT on this date due to assist required for progressing safe mobility, completion of ADLs. Tx session targeted improving activity tolerance and mobility for improved ADL tolerance and completion. Improvements noted in STS and amb on this date with pt performing STS with MOD A +2, amb with MIN A and chair follow short in room distances (approx 12') and heavy reliance on walker. At baseline pt amb over 100' with device and performs ADL with MOD I. Pt requires MOD A for LB dressing on this date. Pt is making progress towards goals however continues to perform below PLOF, continue to highly recommend discharge to intensive rehab to address functional deficits and to facilitate return to PLOF.    Recommendations for follow up therapy are one component of a multi-disciplinary discharge planning process, led by the attending physician.  Recommendations may be updated based on patient status, additional functional criteria and insurance authorization.    Follow Up Recommendations  Acute inpatient rehab (3hours/day)    Assistance Recommended at Discharge Intermittent Supervision/Assistance  Patient can return home with the following  A lot of help with walking and/or transfers;A lot of help with bathing/dressing/bathroom   Equipment Recommendations  None recommended by OT;Other (comment) (per next venue of care, pt has recommended equipment)     Recommendations for Other Services      Precautions / Restrictions Precautions Precautions: Fall Restrictions Weight Bearing Restrictions: No       Mobility Bed Mobility Overal bed mobility: Needs Assistance Bed Mobility: Supine to Sit     Supine to sit: Min assist, Mod assist Sit to supine: Max assist        Transfers Overall transfer level: Needs assistance Equipment used: Rolling walker (2 wheels) Transfers: Sit to/from Stand Sit to Stand: Mod assist, +2 physical assistance, +2 safety/equipment, From elevated surface           General transfer comment: step by step vcs for technique, MOD A from lower surface with RW     Balance Overall balance assessment: Needs assistance Sitting-balance support: Feet supported, No upper extremity supported Sitting balance-Leahy Scale: Fair Sitting balance - Comments: Pt fair-good static sitting, fair dynamic sitting balance when donning AFOs and shoes   Standing balance support: Reliant on assistive device for balance, During functional activity, Bilateral upper extremity supported Standing balance-Leahy Scale: Poor                             ADL either performed or assessed with clinical judgement   ADL Overall ADL's : Needs assistance/impaired                 Upper Body Dressing : Minimal assistance;Sitting Upper Body Dressing Details (indicate cue type and reason): gown Lower Body Dressing: Moderate assistance Lower Body Dressing Details (indicate cue type and reason): AFOs and shoes Toilet Transfer: Moderate assistance Toilet Transfer Details (indicate cue type and reason): simulated  Extremity/Trunk Assessment              Vision       Perception     Praxis      Cognition Arousal/Alertness: Awake/alert Behavior During Therapy: WFL for tasks assessed/performed Overall Cognitive Status: Within Functional Limits for tasks assessed                                           Exercises      Shoulder Instructions       General Comments HR up to 69 with activity, BP WNL prior to and during mobility    Pertinent Vitals/ Pain       Pain Assessment Pain Assessment: No/denies pain  Home Living                                          Prior Functioning/Environment              Frequency  Min 4X/week        Progress Toward Goals  OT Goals(current goals can now be found in the care plan section)  Progress towards OT goals: Progressing toward goals     Plan Discharge plan remains appropriate;Frequency needs to be updated    Co-evaluation    PT/OT/SLP Co-Evaluation/Treatment: Yes     OT goals addressed during session: ADL's and self-care      AM-PAC OT "6 Clicks" Daily Activity     Outcome Measure   Help from another person eating meals?: None Help from another person taking care of personal grooming?: None Help from another person toileting, which includes using toliet, bedpan, or urinal?: A Lot Help from another person bathing (including washing, rinsing, drying)?: A Lot Help from another person to put on and taking off regular upper body clothing?: A Little Help from another person to put on and taking off regular lower body clothing?: A Lot 6 Click Score: 17    End of Session Equipment Utilized During Treatment: Gait belt;Rolling walker (2 wheels)  OT Visit Diagnosis: Muscle weakness (generalized) (M62.81);Unsteadiness on feet (R26.81)   Activity Tolerance Patient tolerated treatment well   Patient Left in bed;with call bell/phone within reach;with bed alarm set   Nurse Communication Mobility status        Time: 4270-6237 OT Time Calculation (min): 26 min  Charges: OT General Charges $OT Visit: 1 Visit OT Treatments $Therapeutic Activity: 8-22 mins Oleta Mouse, OTD OTR/L  11/17/21, 3:23 PM

## 2021-11-18 DIAGNOSIS — G35 Multiple sclerosis: Secondary | ICD-10-CM | POA: Diagnosis not present

## 2021-11-18 LAB — BASIC METABOLIC PANEL
Anion gap: 3 — ABNORMAL LOW (ref 5–15)
BUN: 29 mg/dL — ABNORMAL HIGH (ref 6–20)
CO2: 23 mmol/L (ref 22–32)
Calcium: 8.7 mg/dL — ABNORMAL LOW (ref 8.9–10.3)
Chloride: 116 mmol/L — ABNORMAL HIGH (ref 98–111)
Creatinine, Ser: 0.64 mg/dL (ref 0.44–1.00)
GFR, Estimated: >60 mL/min (ref >60)
Glucose, Bld: 131 mg/dL — ABNORMAL HIGH (ref 70–99)
Potassium: 4.3 mmol/L (ref 3.5–5.1)
Sodium: 142 mmol/L (ref 135–145)

## 2021-11-18 LAB — CBC
HCT: 35.6 % — ABNORMAL LOW (ref 36.0–46.0)
Hemoglobin: 12.5 g/dL (ref 12.0–15.0)
MCH: 26.7 pg (ref 26.0–34.0)
MCHC: 35.1 g/dL (ref 30.0–36.0)
MCV: 75.9 fL — ABNORMAL LOW (ref 80.0–100.0)
Platelets: 269 10*3/uL (ref 150–400)
RBC: 4.69 MIL/uL (ref 3.87–5.11)
RDW: 14.2 % (ref 11.5–15.5)
WBC: 9 10*3/uL (ref 4.0–10.5)
nRBC: 0 % (ref 0.0–0.2)

## 2021-11-18 LAB — MAGNESIUM: Magnesium: 2.3 mg/dL (ref 1.7–2.4)

## 2021-11-18 MED ORDER — AMOXICILLIN-POT CLAVULANATE 875-125 MG PO TABS
1.0000 | ORAL_TABLET | Freq: Two times a day (BID) | ORAL | Status: DC
Start: 2021-11-18 — End: 2021-11-21
  Administered 2021-11-18 – 2021-11-21 (×7): 1 via ORAL
  Filled 2021-11-18 (×7): qty 1

## 2021-11-18 MED ORDER — BISACODYL 10 MG RE SUPP
10.0000 mg | Freq: Once | RECTAL | Status: AC
Start: 2021-11-18 — End: 2021-11-18
  Administered 2021-11-18: 10 mg via RECTAL
  Filled 2021-11-18: qty 1

## 2021-11-18 NOTE — Progress Notes (Signed)
Physical Therapy Treatment Patient Details Name: Lisa Williams Portee MRN: [id number] DOB: [birth year] Today's Date: 11/18/2021   History of Present Illness Pt is a 61 y.o. female with PMH including HTN, MS symptomatic mostly for spasticity and lower extremity weakness, independent at home with wheelchair and walker and drives. Pt presented to the ED on 11/14/21 with concern for multiple sclerosis exacerbation. Pt underwent I&D in ED for left groin abscess.    PT Comments    Pt had just finished OT upon arriving. She is A and O x 4 and agreeable to session. She was able to stand and ambulate with +2 assistance ~ 30 ft with chair follow for safety. Stood from lower recliner height with max assist +2. Fatigued quickly so required sliding board transfer to return to bed after 2nd gait trial. Overall pt is improving quickly but not at baseline. She will greatly benefit form SNF to progress to PLOF.   Recommendations for follow up therapy are one component of a multi-disciplinary discharge planning process, led by the attending physician.  Recommendations may be updated based on patient status, additional functional criteria and insurance authorization.  Follow Up Recommendations  Skilled nursing-short term rehab (<3 hours/day)     Assistance Recommended at Discharge Frequent or constant Supervision/Assistance  Patient can return home with the following Assistance with cooking/housework;Assist for transportation;Help with stairs or ramp for entrance;Two people to help with walking and/or transfers;Two people to help with bathing/dressing/bathroom   Equipment Recommendations  None recommended by PT       Precautions / Restrictions Precautions Precautions: Fall Restrictions Weight Bearing Restrictions: No     Mobility  Bed Mobility  General bed mobility comments: pt was seated EOB upon arriving. just finsihed workign with OT prior. required mod/max of one    Transfers Overall transfer  level: Needs assistance Equipment used: Rolling walker (2 wheels) Transfers: Sit to/from Stand Sit to Stand: +2 safety/equipment, +2 physical assistance, From elevated surface, Mod assist, Max assist    General transfer comment: pt stood 1 x from EOB (mod +2) and 1 x from recliner max +2. Elevated surface heights for safety    Ambulation/Gait Ambulation/Gait assistance: +2 safety/equipment, Min assist (chair follwo for safety but min assist for lateral wt shift for opposite LE advance.) Gait Distance (Feet): 30 Feet Assistive device: Rolling walker (2 wheels) Gait Pattern/deviations: Step-to pattern Gait velocity: decreased     General Gait Details: pt was able to ambulate 1 x 30 ft with BLE AFO + chair follow. Pt tends to ambulate with knee in extension . lateral wt shift required to advance oppsoite LE advancement     Balance Overall balance assessment: Needs assistance Sitting-balance support: Feet supported, No upper extremity supported Sitting balance-Leahy Scale: Good     Standing balance support: Reliant on assistive device for balance, During functional activity, Bilateral upper extremity supported Standing balance-Leahy Scale: Poor Standing balance comment: pt is reliant on RW for all standing activity       Cognition Arousal/Alertness: Awake/alert Behavior During Therapy: WFL for tasks assessed/performed Overall Cognitive Status: Within Functional Limits for tasks assessed      General Comments: A&Ox4               Pertinent Vitals/Pain Pain Assessment Pain Assessment: 0-10 Pain Score: 4  Pain Location: L groin due to abscess Pain Descriptors / Indicators: Discomfort Pain Intervention(s): Limited activity within patient's tolerance, Monitored during session, Premedicated before session, Repositioned     PT Goals (current goals can now  be found in the care plan section) Acute Rehab PT Goals Patient Stated Goal: [facility] then home Progress  towards PT goals: Progressing toward goals    Frequency    7X/week      PT Plan Current plan remains appropriate       AM-PAC PT "6 Clicks" Mobility   Outcome Measure  Help needed turning from your back to your side while in a flat bed without using bedrails?: A Lot Help needed moving from lying on your back to sitting on the side of a flat bed without using bedrails?: A Lot Help needed moving to and from a bed to a chair (including a wheelchair)?: Total Help needed standing up from a chair using your arms (e.g., wheelchair or bedside chair)?: Total Help needed to walk in hospital room?: Total Help needed climbing 3-5 steps with a railing? : Total 6 Click Score: 8    End of Session Equipment Utilized During Treatment: Gait belt Activity Tolerance: Patient tolerated treatment well;Patient limited by fatigue Patient left: in bed;with call bell/phone within reach;with bed alarm set Nurse Communication: Mobility status PT Visit Diagnosis: Difficulty in walking, not elsewhere classified (R26.2);Muscle weakness (generalized) (M62.81);Other abnormalities of gait and mobility (R26.89)     Time: 1829-9371 PT Time Calculation (min) (ACUTE ONLY): 21 min  Charges:  $Gait Training: 8-22 mins                     Jetta Lout PTA 11/18/21, 5:22 PM

## 2021-11-18 NOTE — Progress Notes (Signed)
Occupational Therapy Treatment Patient Details Name: Lisa Williams MRN: [id number] DOB: Aug 12, 1960 Today's Date: 11/18/2021   History of present illness Pt is a 61 y.o. female with PMH including HTN, MS symptomatic mostly for spasticity and lower extremity weakness, independent at home with wheelchair and walker and drives. Pt presented to the ED on 11/14/21 with concern for multiple sclerosis exacerbation. Pt underwent I&D in ED for left groin abscess.   OT comments  Chart reviewed, pt greeted in bed agreeable to OT tx session. Tx session targeted improving activity tolerance for improved ADL completion. Improvements noted in bed mobility on this date. Attempted STS, unable to achieve full upright standing without +2. Tx session then targeted improving static and dynamic sitting balance with pt able to reach out of base of support with supervision. Of note, pt able to perform STS with MOD A +2, amb within room with RW +2 during PT session as OT provided a +2 assist.    Recommendations for follow up therapy are one component of a multi-disciplinary discharge planning process, led by the attending physician.  Recommendations may be updated based on patient status, additional functional criteria and insurance authorization.    Follow Up Recommendations  Skilled nursing-short term rehab (<3 hours/day)    Assistance Recommended at Discharge Intermittent Supervision/Assistance  Patient can return home with the following  A lot of help with walking and/or transfers;A lot of help with bathing/dressing/bathroom   Equipment Recommendations  Other (comment) (per next venue of care)    Recommendations for Other Services      Precautions / Restrictions Precautions Precautions: Fall Restrictions Weight Bearing Restrictions: No       Mobility Bed Mobility Overal bed mobility: Needs Assistance Bed Mobility: Supine to Sit     Supine to sit: Min assist, HOB elevated, Mod assist      General bed mobility comments: for managment of BLEs    Transfers Overall transfer level: Needs assistance Equipment used: Rolling walker (2 wheels) Transfers: Sit to/from Stand Sit to Stand: From elevated surface, Max assist           General transfer comment: step by step vcs with blocking knees, pt unable to achieve full upright standing with +1 with RW     Balance Overall balance assessment: Needs assistance Sitting-balance support: Feet supported, No upper extremity supported Sitting balance-Leahy Scale: Good     Standing balance support: Reliant on assistive device for balance, During functional activity, Bilateral upper extremity supported Standing balance-Leahy Scale: Zero                             ADL either performed or assessed with clinical judgement   ADL Overall ADL's : Needs assistance/impaired     Grooming: Wash/dry hands;Sitting;Set up           Upper Body Dressing : Minimal assistance;Sitting Upper Body Dressing Details (indicate cue type and reason): gown Lower Body Dressing: Moderate assistance Lower Body Dressing Details (indicate cue type and reason): AFOs and shoes                    Extremity/Trunk Assessment              Vision       Perception     Praxis      Cognition Arousal/Alertness: Awake/alert Behavior During Therapy: WFL for tasks assessed/performed Overall Cognitive Status: Within Functional Limits for tasks assessed  Exercises      Shoulder Instructions       General Comments      Pertinent Vitals/ Pain       Pain Assessment Pain Assessment: No/denies pain  Home Living                                          Prior Functioning/Environment              Frequency  Min 3X/week        Progress Toward Goals  OT Goals(current goals can now be found in the care plan section)  Progress towards OT  goals: Progressing toward goals     Plan Discharge plan needs to be updated;Frequency needs to be updated    Co-evaluation                 AM-PAC OT "6 Clicks" Daily Activity     Outcome Measure   Help from another person eating meals?: None Help from another person taking care of personal grooming?: None Help from another person toileting, which includes using toliet, bedpan, or urinal?: A Lot Help from another person bathing (including washing, rinsing, drying)?: A Lot Help from another person to put on and taking off regular upper body clothing?: A Little Help from another person to put on and taking off regular lower body clothing?: A Lot 6 Click Score: 17    End of Session Equipment Utilized During Treatment: Gait belt;Rolling walker (2 wheels)  OT Visit Diagnosis: Muscle weakness (generalized) (M62.81);Unsteadiness on feet (R26.81)   Activity Tolerance Patient tolerated treatment well   Patient Left with call bell/phone within reach;with bed alarm set;Other (comment) (on edge of bed)   Nurse Communication Mobility status        Time: 9371-6967 OT Time Calculation (min): 17 min  Charges: OT General Charges $OT Visit: 1 Visit OT Treatments $Therapeutic Activity: 8-22 mins  Oleta Mouse, OTD OTR/L  11/18/21, 4:55 PM

## 2021-11-18 NOTE — Progress Notes (Signed)
PROGRESS NOTE    Lisa Williams [place]  BTD:[id number] DOB: 02/14/1961 DOA: 11/14/2021 PCP: Lauro Regulus, MD    Brief Narrative:  61 year old female with history of hypertension, multiple sclerosis with lower leg spasticity and weakness, independent at home with use of wheelchair and walker currently on [place] presented with extreme weakness unable to use her legs and painful swelling on her left groin for the last few days.  In the emergency room fairly stable.  I&D done by ER physician, started on antibiotics and also on high-dose IV steroids for suspected MS flare.  Admitted with neurology consultation.   Assessment & Plan:   Left groin abscess: s/p I&D.  Daily dressing and packing.  Blood cultures negative.  Local culture with Staphylococcus lugdunensis.  Final sensitivity pending.  Due to immunocompromised status, treated with broad-spectrum IV antibiotics.   Adequately clinically improved.   Continue local wound care.  Will change to Augmentin and continue another 5 days.    Multiple sclerosis exacerbation: Due to #1.  Treated with high-dose IV steroids 1 g daily, neurology recommended 5 days of therapy.  Already improving.  Appropriate for rehab.  Patient can be transferred to inpatient rehab if they can administer IV steroids for total of 5 days. Day 4/5 today.  Last dose will be 7/15.  Abnormal LFTs: Stable and chronic.  Essential hypertension: Stable on telmisartan.  Bradycardia: Sinus bradycardia.  Asymptomatic.  Heart rate more than 40 at rest, occasionally less than 40 at sleep but never less than 30.  Monitor.  Not on any rate control medications.  Medically stable. If able to go to CIR, she can take additional 1 dose of steroids tomorrow at [facility]. If she needs to go to a SNF, she can go tomorrow after completing IV steroid.   DVT prophylaxis:   Lovenox   Code Status: Full code Family Communication: None Disposition Plan: Status is: Inpatient Remains  inpatient appropriate because: Needs rehab.     Consultants:  Neurology  Procedures:  I&D left groin  Antimicrobials:  Vancomycin and cefepime 7/10--7/14 Augmentin 7/14---   Subjective:  Patient seen and examined.  No new events.  Legs are weak but motivated to walk. Epigastric pain is better.  Objective: Vitals:   11/17/21 1559 11/17/21 2035 11/18/21 0434 11/18/21 0829  BP: (!) 126/59 129/65 119/61 (!) 157/70  Pulse: (!) 48 (!) 46 (!) 42 (!) 40  Resp: 20  19 16   Temp: 97.6 F (36.4 C) 97.8 F (36.6 C) (!) 97.5 F (36.4 C) 97.9 F (36.6 C)  TempSrc:   Oral Oral  SpO2: 98% 97% 96% 100%  Weight:      Height:        Intake/Output Summary (Last 24 hours) at 11/18/2021 1117 Last data filed at 11/18/2021 0435 Gross per 24 hour  Intake 2072 ml  Output 500 ml  Net 1572 ml   Filed Weights   11/15/21 0500  Weight: 119 kg    Examination:  General: Looks fairly comfortable.  Laying in bed.  On room air. Cardiovascular: S1-S2 normal.  Regular rate rhythm. Respiratory: Bilateral clear.  No added sounds. Gastrointestinal: Soft.  Nontender. Ext: No edema or cyanosis.  No deformities. Neuro: Alert oriented x4.  Normal upper extremity strength and sensation. Bilateral lower extremity 3/5, generalized weakness. Left groin with packed dressing, dry dressing.  No surrounding fluctuation. Clean and dry.  Nontender.     Data Reviewed: I have personally reviewed following labs and imaging studies  CBC: Recent Labs  Lab 11/14/21 1758 11/15/21 0528 11/16/21 0503 11/17/21 0626 11/18/21 0430  WBC 10.9* 7.0 7.3 9.9 9.0  HGB 13.1 12.8 12.6 12.8 12.5  HCT 38.3 36.4 36.3 37.6 35.6*  MCV 76.0* 76.0* 76.1* 76.4* 75.9*  PLT 256 240 254 274 269   Basic Metabolic Panel: Recent Labs  Lab 11/14/21 1758 11/15/21 0528 11/16/21 0503 11/17/21 0626 11/18/21 0430  NA 140  --  141 141 142  K 4.2  --  4.1 4.3 4.3  CL 106  --  113* 112* 116*  CO2 24  --  22 24 23   GLUCOSE  103*  --  138* 135* 131*  BUN 22*  --  25* 25* 29*  CREATININE 0.68 0.66 0.61 0.75 0.64  CALCIUM 9.1  --  9.2 9.0 8.7*  MG  --   --  2.2 2.2 2.3   GFR: Estimated Creatinine Clearance: 106.4 mL/min (by C-G formula based on SCr of 0.64 mg/dL). Liver Function Tests: Recent Labs  Lab 11/14/21 1758  AST 52*  ALT 88*  ALKPHOS 146*  BILITOT 1.0  PROT 6.8  ALBUMIN 4.0   No results for input(s): "LIPASE", "AMYLASE" in the last 168 hours. No results for input(s): "AMMONIA" in the last 168 hours. Coagulation Profile: Recent Labs  Lab 11/14/21 2357  INR 1.1   Cardiac Enzymes: No results for input(s): "CKTOTAL", "CKMB", "CKMBINDEX", "TROPONINI" in the last 168 hours. BNP (last 3 results) No results for input(s): "PROBNP" in the last 8760 hours. HbA1C: No results for input(s): "HGBA1C" in the last 72 hours. CBG: No results for input(s): "GLUCAP" in the last 168 hours. Lipid Profile: No results for input(s): "CHOL", "HDL", "LDLCALC", "TRIG", "CHOLHDL", "LDLDIRECT" in the last 72 hours. Thyroid Function Tests: No results for input(s): "TSH", "T4TOTAL", "FREET4", "T3FREE", "THYROIDAB" in the last 72 hours. Anemia Panel: No results for input(s): "VITAMINB12", "FOLATE", "FERRITIN", "TIBC", "IRON", "RETICCTPCT" in the last 72 hours. Sepsis Labs: Recent Labs  Lab 11/14/21 2357  LATICACIDVEN 0.9    Recent Results (from the past 240 hour(s))  Blood Culture (routine x 2)     Status: None (Preliminary result)   Collection Time: 11/14/21 11:57 PM   Specimen: BLOOD  Result Value Ref Range Status   Specimen Description BLOOD RIGHT ASSIST CONTROL  Final   Special Requests   Final    BOTTLES DRAWN AEROBIC AND ANAEROBIC Blood Culture adequate volume   Culture   Final    NO GROWTH 3 DAYS Performed at [facility], [address], [place], [place]    Report Status PENDING  Incomplete  Blood Culture (routine x 2)     Status: None (Preliminary result)   Collection Time:  11/14/21 11:57 PM   Specimen: BLOOD  Result Value Ref Range Status   Specimen Description BLOOD LEFT HAND  Final   Special Requests   Final    BOTTLES DRAWN AEROBIC AND ANAEROBIC Blood Culture adequate volume   Culture   Final    NO GROWTH 3 DAYS Performed at [facility], [address] [place], [place]    Report Status PENDING  Incomplete  Urine Culture     Status: None   Collection Time: 11/15/21  1:30 AM   Specimen: Urine, Random  Result Value Ref Range Status   Specimen Description   Final    URINE, RANDOM Performed at [facility], [address] [place], [place]    Special Requests   Final    NONE  Performed at [facility], [address], [place], [place] [address]    Culture   Final    NO GROWTH Performed at [facility], [address], [place], [place] [address]    Report Status 11/16/2021 FINAL  Final  Aerobic/Anaerobic Culture w Gram Stain (surgical/deep wound)     Status: None (Preliminary result)   Collection Time: 11/15/21  1:30 AM   Specimen: Abscess  Result Value Ref Range Status   Specimen Description   Final    ABSCESS GROIN LEFT Performed at [facility], [address] [place], [place] [address]    Special Requests   Final    NONE Performed at [facility], [address] [place], [place] [address]    Gram Stain   Final    FEW WBC PRESENT,BOTH PMN AND MONONUCLEAR FEW GRAM POSITIVE COCCI IN PAIRS FEW GRAM NEGATIVE RODS Performed at [facility], [address] [place], [place] [address]    Culture   Final    RARE STAPHYLOCOCCUS LUGDUNENSIS SUSCEPTIBILITIES TO FOLLOW NO ANAEROBES ISOLATED; CULTURE IN PROGRESS FOR 5 DAYS    Report Status PENDING  Incomplete         Radiology Studies: No results found.      Scheduled Meds:  acidophilus  2 capsule Oral TID   amoxicillin-clavulanate  1 tablet Oral Q12H   baclofen  20 mg Oral TID   cholecalciferol   5,000 Units Oral Daily   enoxaparin (LOVENOX) injection  0.5 mg/kg Subcutaneous Q24H   famotidine  20 mg Oral BID   gabapentin  100 mg Oral BID   irbesartan  75 mg Oral Daily   oxybutynin  10 mg Oral Daily   polyethylene glycol  17 g Oral Daily   Siponimod Fumarate  2 mg Oral Daily   tiZANidine  4 mg Oral BID   Continuous Infusions:  methylPREDNISolone (SOLU-MEDROL) injection 1,000 mg (11/18/21 0841)     LOS: 3 days    Time spent: 35 minutes    Dorcas Carrow, MD [facility] Pager [phone]

## 2021-11-18 NOTE — TOC Progression Note (Addendum)
Transition of Care [facility]) - Progression Note    Patient Details  Name: Myrl Bynum MRN: [id number] Date of Birth: 05/22/1960  Transition of Care [facility]) CM/SW Contact  Truddie Hidden, RN Phone Number: 11/18/2021, 2:37 PM  Clinical Narrative:    Patient denied CIR per [facility] Admissions Director Ottie Glazier. Patient appeals denied. Patient may be approved for SNF LOC. Patient prefers [facility]. Stated she had been there within last year and had spoken with Ricky in admissions. Referral sent to [facility].   2:24pm Spoke with Ricky at [facility] to see if patient could discharge if Berkley Harvey was approved via [facility]. Weekend admission would not be able to be accepted. Patient could be accepted 7/17 pending bed offering and insurance approval.         Expected Discharge Plan and Services                                                 Social Determinants of Health (SDOH) Interventions    Readmission Risk Interventions     No data to display

## 2021-11-18 NOTE — Progress Notes (Signed)
  Inpatient Rehabilitation Admissions Coordinator   [employer] MD has given a denial for CIR request. Feel her rehab care can be provided at SNF level. I spoke with patient by phone and she is in agreement to SNF. Acute team made aware. We will sign off.  Ottie Glazier, RN, MSN Rehab Admissions Coordinator [phone] 11/18/2021 12:09 PM

## 2021-11-18 NOTE — NC FL2 (Signed)
[place] MEDICAID FL2 LEVEL OF CARE SCREENING TOOL     IDENTIFICATION  Patient Name: Lisa Williams Birthdate: July 15, 1960 Sex: female Admission Date (Current Location): 11/14/2021  [facility] and [place] Number:  Chiropodist and Address:  [facility], [address] [place], [place] [address]      Provider Number: [id number]  Attending Physician Name and Address:  Dorcas Carrow, MD  Relative Name and Phone Number:  [name] Bollard,[phone]    Current Level of Care: Hospital Recommended Level of Care: Skilled Nursing Facility Prior Approval Number:    Date Approved/Denied:   PASRR Number: [id number]  Discharge Plan: Home    Current Diagnoses: Patient Active Problem List   Diagnosis Date Noted   Abscess of left groin 11/15/2021   Abnormal LFTs 11/15/2021   Acute respiratory disease due to COVID-19 virus 11/21/2020   Weakness    Hypoalbuminemia due to protein-calorie malnutrition [facility])    Neurogenic bowel    Neurogenic bladder    Labile blood pressure    Neuropathic pain    Abscess of female pelvis    SVT (supraventricular tachycardia) (HCC)    Radial styloid tenosynovitis 03/12/2018   Wheelchair confinement 02/27/2018   Localized osteoporosis with current pathological fracture with routine healing 01/19/2017   Wrist fracture 01/16/2017   Sprain of ankle 03/23/2016   Closed fracture of lateral malleolus 03/16/2016   Health care maintenance 01/24/2016   Blood pressure elevated without history of HTN 10/25/2015   Essential hypertension 10/25/2015   Multiple sclerosis (HCC) 10/02/2015   Chronic left shoulder pain 07/19/2015   Multiple sclerosis exacerbation (HCC) 07/14/2015   MS (multiple sclerosis) (HCC) 11/26/2014   Increased body mass index 11/26/2014   HPV test positive 11/26/2014   Status post laparoscopic supracervical hysterectomy 11/26/2014   Galactorrhea 11/26/2014   Back ache 05/21/2014   Adiposity  05/21/2014   Disordered sleep 05/21/2014   Muscle spasticity 05/21/2014   Spasticity 05/21/2014   Calculus of kidney 12/09/2013   Renal colic 12/09/2013   Hypercholesteremia 08/19/2013   Hereditary and idiopathic neuropathy 08/19/2013   Hypercholesterolemia without hypertriglyceridemia 08/19/2013   Bladder infection, chronic 07/25/2012   Disorder of bladder function 07/25/2012   Incomplete bladder emptying 07/25/2012   Microscopic hematuria 07/25/2012   Right upper quadrant pain 07/25/2012    Orientation RESPIRATION BLADDER Height & Weight     Self, Time, Situation, Place  Normal External catheter Weight: 119 kg Height:  5\' 11"  (180.3 cm)  BEHAVIORAL SYMPTOMS/MOOD NEUROLOGICAL BOWEL NUTRITION STATUS   (n/a)  (n/a) Continent Diet  AMBULATORY STATUS COMMUNICATION OF NEEDS Skin   Limited Assist Verbally Normal                       Personal Care Assistance Level of Assistance  Bathing, Dressing, Total care Bathing Assistance: Limited assistance   Dressing Assistance: Limited assistance Total Care Assistance: Limited assistance   Functional Limitations Info             SPECIAL CARE FACTORS FREQUENCY  PT (By licensed PT), OT (By licensed OT)     PT Frequency: Daily OT Frequency: 4x weekly            Contractures Contractures Info: Not present    Additional Factors Info  Code Status, Allergies Code Status Info: FULL Allergies Info: Amlodipine, Betadine (Povidone-iodine), Povidone Iodine           Current Medications (11/18/2021):  This is the current hospital active medication list Current  Facility-Administered Medications  Medication Dose Route Frequency Provider Last Rate Last Admin   acetaminophen (TYLENOL) tablet 650 mg  650 mg Oral Q6H PRN Andris Baumann, MD   650 mg at 11/15/21 2145   Or   acetaminophen (TYLENOL) suppository 650 mg  650 mg Rectal Q6H PRN Andris Baumann, MD       acidophilus (RISAQUAD) capsule 2 capsule  2 capsule Oral TID  Dorcas Carrow, MD   2 capsule at 11/18/21 0803   amoxicillin-clavulanate (AUGMENTIN) 875-125 MG per tablet 1 tablet  1 tablet Oral Q12H Dorcas Carrow, MD   1 tablet at 11/18/21 1141   baclofen (LIORESAL) tablet 20 mg  20 mg Oral TID Andris Baumann, MD   20 mg at 11/18/21 0803   cholecalciferol (VITAMIN D3) tablet 5,000 Units  5,000 Units Oral Daily Darlin Priestly, MD   5,000 Units at 11/18/21 0803   enoxaparin (LOVENOX) injection 60 mg  0.5 mg/kg Subcutaneous Q24H Lindajo Royal V, MD   60 mg at 11/18/21 0804   famotidine (PEPCID) tablet 20 mg  20 mg Oral BID Dorcas Carrow, MD   20 mg at 11/18/21 0803   gabapentin (NEURONTIN) capsule 100 mg  100 mg Oral BID Andris Baumann, MD   100 mg at 11/18/21 0803   irbesartan (AVAPRO) tablet 75 mg  75 mg Oral Daily Lindajo Royal V, MD   75 mg at 11/18/21 0803   methylPREDNISolone sodium succinate (SOLU-MEDROL) 1,000 mg in sodium chloride 0.9 % 50 mL IVPB  1,000 mg Intravenous Daily Darlin Priestly, MD   Stopped at 11/18/21 0941   morphine (PF) 2 MG/ML injection 2 mg  2 mg Intravenous Daily PRN Dorcas Carrow, MD   2 mg at 11/18/21 0519   ondansetron (ZOFRAN) tablet 4 mg  4 mg Oral Q6H PRN Andris Baumann, MD   4 mg at 11/18/21 1207   Or   ondansetron (ZOFRAN) injection 4 mg  4 mg Intravenous Q6H PRN Andris Baumann, MD       oxybutynin (DITROPAN-XL) 24 hr tablet 10 mg  10 mg Oral Daily Darlin Priestly, MD   10 mg at 11/18/21 0803   polyethylene glycol (MIRALAX / GLYCOLAX) packet 17 g  17 g Oral Daily Dorcas Carrow, MD   17 g at 11/18/21 0803   Siponimod Fumarate TABS 2 mg  2 mg Oral Daily Andris Baumann, MD   2 mg at 11/18/21 0804   tiZANidine (ZANAFLEX) tablet 4 mg  4 mg Oral BID Darlin Priestly, MD   4 mg at 11/18/21 0803     Discharge Medications: Please see discharge summary for a list of discharge medications.  Relevant Imaging Results:  Relevant Lab Results:   Additional Information [id number]  Truddie Hidden, RN

## 2021-11-18 NOTE — Care Management Important Message (Signed)
Important Message  Patient Details  Name: Lisa Williams MRN: [id number] Date of Birth: 13-Jan-1961   Medicare Important Message Given:  Yes     Olegario Messier A [place] 11/18/2021, 2:19 PM

## 2021-11-19 LAB — BASIC METABOLIC PANEL
Anion gap: 3 — ABNORMAL LOW (ref 5–15)
BUN: 31 mg/dL — ABNORMAL HIGH (ref 6–20)
CO2: 22 mmol/L (ref 22–32)
Calcium: 8.7 mg/dL — ABNORMAL LOW (ref 8.9–10.3)
Chloride: 112 mmol/L — ABNORMAL HIGH (ref 98–111)
Creatinine, Ser: 0.69 mg/dL (ref 0.44–1.00)
GFR, Estimated: >60 mL/min (ref >60)
Glucose, Bld: 121 mg/dL — ABNORMAL HIGH (ref 70–99)
Potassium: 4.2 mmol/L (ref 3.5–5.1)
Sodium: 137 mmol/L (ref 135–145)

## 2021-11-19 LAB — CBC
HCT: 35.9 % — ABNORMAL LOW (ref 36.0–46.0)
Hemoglobin: 12.9 g/dL (ref 12.0–15.0)
MCH: 26.4 pg (ref 26.0–34.0)
MCHC: 35.9 g/dL (ref 30.0–36.0)
MCV: 73.4 fL — ABNORMAL LOW (ref 80.0–100.0)
Platelets: 266 10*3/uL (ref 150–400)
RBC: 4.89 MIL/uL (ref 3.87–5.11)
RDW: 14.5 % (ref 11.5–15.5)
WBC: 9.2 10*3/uL (ref 4.0–10.5)
nRBC: 0.2 % (ref 0.0–0.2)

## 2021-11-19 LAB — MAGNESIUM: Magnesium: 2.4 mg/dL (ref 1.7–2.4)

## 2021-11-19 NOTE — TOC Progression Note (Addendum)
Transition of Care [facility]) - Progression Note    Patient Details  Name: Nicolle Heward MRN: [id number] Date of Birth: 06-Mar-1961  Transition of Care [facility]) CM/SW Contact  Bing Quarry, RN Phone Number: 11/19/2021, 9:38 AM  Clinical Narrative: 11/19/21: Babette Relic from HTA called RN CM to update on peer to peer denial for CIR and fax number to send paperwork. RN CM asked about insurance authorization for discharge anticipated Monday to [facility] at [facility] per Ricky/prior CM note. Authorization had not yet been started per HTA but will be as of now (940 am).  No other information needed from CM for authorization to begin. Update provider and Unit RN. Requested EMS transport if needed so HTA can approve or deny so it does not become a barrier to discharge Monday. Gabriel Cirri RN CM   UPDATE: Received VM from Tammy at [employer]. Denial letter from HTA received via Fax. SNF authorization [id number] and [facility] transport approved with [phone] via HTA VM. Anticipate Discharge Monday. Patient updated as well via phone call and she stated she was aware of denial and SNF/[facility] pending. Updated her on approvals. Gabriel Cirri RN CM          Expected Discharge Plan and Services                                                 Social Determinants of Health (SDOH) Interventions    Readmission Risk Interventions     No data to display

## 2021-11-19 NOTE — Progress Notes (Addendum)
PROGRESS NOTE    Lisa Williams  YKD:[id number] DOB: [birth year] DOA: 11/14/2021 PCP: Lauro Regulus, MD    Brief Narrative:  61 year old female with history of hypertension, multiple sclerosis with lower leg spasticity and weakness, independent at home with use of wheelchair and walker currently on [place] presented with extreme weakness unable to use her legs and painful swelling on her left groin for the last few days.  In the emergency room fairly stable.  I&D done by ER physician, started on antibiotics and also on high-dose IV steroids for suspected MS flare.  Admitted with neurology consultation. 7/15- last day of IV steroids, will transition to taper, will need f/u with Neuro. Cont Augementin for groin abscess s/p I&D; Plan for SNF/rehab 7/17 dc pending  Assessment & Plan:   Left groin abscess: s/p I&D.  Daily dressing and packing.  Blood cultures negative.  Local culture with Staphylococcus lugdunensis.  Final sensitivity pending.  Due to immunocompromised status, treated with broad-spectrum IV antibiotics.   Adequately clinically improved.   Continue local wound care.  Will change to Augmentin and continue another 5 days.    Multiple sclerosis exacerbation: Due to #1.  Treated with high-dose IV steroids 1 g daily, neurology recommended 5 days of therapy.  Already improving.  Appropriate for rehab.  Patient can be transferred to inpatient rehab if they can administer IV steroids for total of 5 days. Day 4/5 today.  Last dose will be 7/15.  Abnormal LFTs: Stable and chronic.  Essential hypertension: Stable on telmisartan.  Bradycardia: Sinus bradycardia.  Asymptomatic.  Heart rate more than 40 at rest, occasionally less than 40 at sleep but never less than 30.  Monitor.  Not on any rate control medications.  Medically stable. If able to go to CIR, she can take additional 1 dose of steroids tomorrow at [facility]. If she needs to go to a SNF, she can go tomorrow after  completing IV steroid.   DVT prophylaxis:   Lovenox   Code Status: Full code Family Communication: None Disposition Plan: Status is: Inpatient Remains inpatient appropriate because: Needs rehab.     Consultants:  Neurology  Procedures:  I&D left groin  Antimicrobials:  Vancomycin and cefepime 7/10--7/14 Augmentin 7/14---   Subjective: NAEON.   Objective: Vitals:   11/18/21 0829 11/18/21 1646 11/18/21 1926 11/19/21 0448  BP: (!) 157/70 (!) 158/76 (!) 146/65 (!) 124/58  Pulse: (!) 40 (!) 53 (!) 54 (!) 45  Resp: 16 20 18 15   Temp: 97.9 F (36.6 C) 98.2 F (36.8 C) 98 F (36.7 C) (!) 97.5 F (36.4 C)  TempSrc: Oral     SpO2: 100% 96% 98% 97%  Weight:      Height:        Intake/Output Summary (Last 24 hours) at 11/19/2021 0757 Last data filed at 11/19/2021 0500 Gross per 24 hour  Intake 766 ml  Output 500 ml  Net 266 ml    Filed Weights   11/15/21 0500  Weight: 119 kg    Examination:  General: Looks fairly comfortable.  Laying in bed.  On room air. Cardiovascular: S1-S2 normal.  Regular rate rhythm. Respiratory: Bilateral clear.  No added sounds. Gastrointestinal: Soft.  Nontender. Ext: No edema or cyanosis.  No deformities. Neuro: Alert oriented x4.  Normal upper extremity strength and sensation. Bilateral lower extremity 3/5, generalized weakness. Left groin with packed dressing, dry dressing.  No surrounding fluctuation. Clean and dry.  Nontender.     Data Reviewed: I have  personally reviewed following labs and imaging studies  CBC: Recent Labs  Lab 11/15/21 0528 11/16/21 0503 11/17/21 0626 11/18/21 0430 11/19/21 0604  WBC 7.0 7.3 9.9 9.0 9.2  HGB 12.8 12.6 12.8 12.5 12.9  HCT 36.4 36.3 37.6 35.6* 35.9*  MCV 76.0* 76.1* 76.4* 75.9* 73.4*  PLT 240 254 274 269 266    Basic Metabolic Panel: Recent Labs  Lab 11/14/21 1758 11/15/21 0528 11/16/21 0503 11/17/21 0626 11/18/21 0430 11/19/21 0604  NA 140  --  141 141 142 137  K 4.2   --  4.1 4.3 4.3 4.2  CL 106  --  113* 112* 116* 112*  CO2 24  --  22 24 23 22   GLUCOSE 103*  --  138* 135* 131* 121*  BUN 22*  --  25* 25* 29* 31*  CREATININE 0.68 0.66 0.61 0.75 0.64 0.69  CALCIUM 9.1  --  9.2 9.0 8.7* 8.7*  MG  --   --  2.2 2.2 2.3 2.4    GFR: Estimated Creatinine Clearance: 106.4 mL/min (by C-G formula based on SCr of 0.69 mg/dL). Liver Function Tests: Recent Labs  Lab 11/14/21 1758  AST 52*  ALT 88*  ALKPHOS 146*  BILITOT 1.0  PROT 6.8  ALBUMIN 4.0    No results for input(s): "LIPASE", "AMYLASE" in the last 168 hours. No results for input(s): "AMMONIA" in the last 168 hours. Coagulation Profile: Recent Labs  Lab 11/14/21 2357  INR 1.1    Cardiac Enzymes: No results for input(s): "CKTOTAL", "CKMB", "CKMBINDEX", "TROPONINI" in the last 168 hours. BNP (last 3 results) No results for input(s): "PROBNP" in the last 8760 hours. HbA1C: No results for input(s): "HGBA1C" in the last 72 hours. CBG: No results for input(s): "GLUCAP" in the last 168 hours. Lipid Profile: No results for input(s): "CHOL", "HDL", "LDLCALC", "TRIG", "CHOLHDL", "LDLDIRECT" in the last 72 hours. Thyroid Function Tests: No results for input(s): "TSH", "T4TOTAL", "FREET4", "T3FREE", "THYROIDAB" in the last 72 hours. Anemia Panel: No results for input(s): "VITAMINB12", "FOLATE", "FERRITIN", "TIBC", "IRON", "RETICCTPCT" in the last 72 hours. Sepsis Labs: Recent Labs  Lab 11/14/21 2357  LATICACIDVEN 0.9     Recent Results (from the past 240 hour(s))  Blood Culture (routine x 2)     Status: None (Preliminary result)   Collection Time: 11/14/21 11:57 PM   Specimen: BLOOD  Result Value Ref Range Status   Specimen Description BLOOD RIGHT ASSIST CONTROL  Final   Special Requests   Final    BOTTLES DRAWN AEROBIC AND ANAEROBIC Blood Culture adequate volume   Culture   Final    NO GROWTH 4 DAYS Performed at [facility], [address] [place], [place]     Report Status PENDING  Incomplete  Blood Culture (routine x 2)     Status: None (Preliminary result)   Collection Time: 11/14/21 11:57 PM   Specimen: BLOOD  Result Value Ref Range Status   Specimen Description BLOOD LEFT HAND  Final   Special Requests   Final    BOTTLES DRAWN AEROBIC AND ANAEROBIC Blood Culture adequate volume   Culture   Final    NO GROWTH 4 DAYS Performed at [facility], [address], [place], [place]    Report Status PENDING  Incomplete  Urine Culture     Status: None   Collection Time: 11/15/21  1:30 AM   Specimen: Urine, Random  Result Value Ref Range Status   Specimen Description   Final  URINE, RANDOM Performed at [facility], [address], [place], [place] [address]    Special Requests   Final    NONE Performed at [facility], [address] [place], [place] [address]    Culture   Final    NO GROWTH Performed at [facility], [address] [place], [place] [address]    Report Status 11/16/2021 FINAL  Final  Aerobic/Anaerobic Culture w Gram Stain (surgical/deep wound)     Status: None (Preliminary result)   Collection Time: 11/15/21  1:30 AM   Specimen: Abscess  Result Value Ref Range Status   Specimen Description   Final    ABSCESS GROIN LEFT Performed at [facility], [address], [place], [place] [address]    Special Requests   Final    NONE Performed at [facility], [address] [place], [place] [address]    Gram Stain   Final    FEW WBC PRESENT,BOTH PMN AND MONONUCLEAR FEW GRAM POSITIVE COCCI IN PAIRS FEW GRAM NEGATIVE RODS Performed at [facility], [address] [place], [place] [address]    Culture   Final    RARE STAPHYLOCOCCUS LUGDUNENSIS SUSCEPTIBILITIES TO FOLLOW NO ANAEROBES ISOLATED; CULTURE IN PROGRESS FOR 5 DAYS    Report Status PENDING  Incomplete         Radiology Studies: No results found.     Scheduled Meds:   acidophilus  2 capsule Oral TID   amoxicillin-clavulanate  1 tablet Oral Q12H   baclofen  20 mg Oral TID   cholecalciferol  5,000 Units Oral Daily   enoxaparin (LOVENOX) injection  0.5 mg/kg Subcutaneous Q24H   famotidine  20 mg Oral BID   gabapentin  100 mg Oral BID   irbesartan  75 mg Oral Daily   oxybutynin  10 mg Oral Daily   polyethylene glycol  17 g Oral Daily   Siponimod Fumarate  2 mg Oral Daily   tiZANidine  4 mg Oral BID   Continuous Infusions:  methylPREDNISolone (SOLU-MEDROL) injection Stopped (11/18/21 0941)     LOS: 4 days    Time spent: 35 minutes    Thomas Hoff, MD [facility] Pager [phone]

## 2021-11-19 NOTE — Progress Notes (Signed)
Physical Therapy Treatment Patient Details Name: Lisa Williams MRN: [id number] DOB: 16-Jan-1961 Today's Date: 11/19/2021   History of Present Illness Pt is a 61 y.o. female with PMH including HTN, MS symptomatic mostly for spasticity and lower extremity weakness, independent at home with wheelchair and walker and drives. Pt presented to the ED on 11/14/21 with concern for multiple sclerosis exacerbation. Pt underwent I&D in ED for left groin abscess.    PT Comments    Pt was long sitting in bed upon arriving. She continues to be A and O x 4 and extremely motivated. Eager to get OOB and ambulate. Required assistance to achieve EOB shorts sit. Sat EOB while author placed BLE AFOs. Session progressed to stand to RW (bariatric) from elevated bed height. Ambulated 75 ft with RW + chair follow for safety. Pt does endorse fatigue afterwards. She was repositioned in recliner post session with call bell in reach and RN tech in room. Pt is progressing well but still not at baseline. Recommend DC to SNF to address deficits while maximizing independence with ADLs.    Recommendations for follow up therapy are one component of a multi-disciplinary discharge planning process, led by the attending physician.  Recommendations may be updated based on patient status, additional functional criteria and insurance authorization.  Follow Up Recommendations  Skilled nursing-short term rehab (<3 hours/day)     Assistance Recommended at Discharge Frequent or constant Supervision/Assistance  Patient can return home with the following Assistance with cooking/housework;Assist for transportation;Help with stairs or ramp for entrance;Two people to help with walking and/or transfers;Two people to help with bathing/dressing/bathroom   Equipment Recommendations  None recommended by PT       Precautions / Restrictions Precautions Precautions: Fall Restrictions Weight Bearing Restrictions: No     Mobility  Bed  Mobility Overal bed mobility: Needs Assistance Bed Mobility: Supine to Sit   Supine to sit: Mod assist, HOB elevated   General bed mobility comments: Increased time to perform assisted BLEs to EOB. Sitting balance is improving but still lacks core strength. Pt is able to apply BLE AFO with assist however author elected to place on pt to safe energy for ambulation.    Transfers Overall transfer level: Needs assistance Equipment used: Rolling walker (2 wheels) Transfers: Sit to/from Stand Sit to Stand: +2 safety/equipment, From elevated surface, Mod assist, Max assist    General transfer comment: pt is able to stand with +1 mod-max assist with bed height elevated > bariatric RW    Ambulation/Gait Ambulation/Gait assistance: +2 safety/equipment, Min assist Gait Distance (Feet): 75 Feet Assistive device: Rolling walker (2 wheels) Gait Pattern/deviations: Step-to pattern, Knee hyperextension - right, Knee hyperextension - left, Wide base of support Gait velocity: decreased     General Gait Details: pt continues to require min assist of one + chair follow for safety. Tends to have knees locked in extension with wide BOS. Fatigues quickly with activity    Balance Overall balance assessment: Needs assistance Sitting-balance support: Feet supported, Bilateral upper extremity supported Sitting balance-Leahy Scale: Fair Sitting balance - Comments: pt lacks core strength but was able to sit EOB with close supervision   Standing balance support: Reliant on assistive device for balance, During functional activity, Bilateral upper extremity supported Standing balance-Leahy Scale: Poor Standing balance comment: pt is reliant on RW for all standing activity      Cognition Arousal/Alertness: Awake/alert Behavior During Therapy: WFL for tasks assessed/performed Overall Cognitive Status: Within Functional Limits for tasks assessed   General Comments:  A&Ox4               Pertinent  Vitals/Pain Pain Assessment Pain Assessment: No/denies pain     PT Goals (current goals can now be found in the care plan section) Acute Rehab PT Goals Patient Stated Goal: [facility] home Progress towards PT goals: Progressing toward goals    Frequency    7X/week      PT Plan Current plan remains appropriate       AM-PAC PT "6 Clicks" Mobility   Outcome Measure  Help needed turning from your back to your side while in a flat bed without using bedrails?: A Lot Help needed moving from lying on your back to sitting on the side of a flat bed without using bedrails?: A Lot Help needed moving to and from a bed to a chair (including a wheelchair)?: A Lot Help needed standing up from a chair using your arms (e.g., wheelchair or bedside chair)?: A Lot Help needed to walk in hospital room?: A Lot Help needed climbing 3-5 steps with a railing? : Total 6 Click Score: 11    End of Session Equipment Utilized During Treatment: Gait belt Activity Tolerance: Patient tolerated treatment well;Patient limited by fatigue Patient left: in chair;with call bell/phone within reach;with chair alarm set Nurse Communication: Mobility status PT Visit Diagnosis: Difficulty in walking, not elsewhere classified (R26.2);Muscle weakness (generalized) (M62.81);Other abnormalities of gait and mobility (R26.89)     Time: 9324-1991 PT Time Calculation (min) (ACUTE ONLY): 20 min  Charges:  $Gait Training: 8-22 mins                     Jetta Lout PTA 11/19/21, 2:11 PM

## 2021-11-19 NOTE — Progress Notes (Signed)
Physical Therapy Treatment Patient Details Name: Lisa Williams MRN: [id number] DOB: 10/01/1960 Today's Date: 11/19/2021   History of Present Illness Pt is a 61 y.o. female with PMH including HTN, MS symptomatic mostly for spasticity and lower extremity weakness, independent at home with wheelchair and walker and drives. Pt presented to the ED on 11/14/21 with concern for multiple sclerosis exacerbation. Pt underwent I&D in ED for left groin abscess.    PT Comments    Pt was requesting to return to bed. Was able to stand with +2 max assist (less assist earlier from elevated bed height) however due to fatigue from ambulation, earlier in the day, was unable to progress to taking steps to pivot back to EOB. Elected to sit back down and use slide board to return to bed from recliner. She required mod assist with slide board to safely get back to bed. Overall pt is improving. See earlier note for pt's progress with gait and transfers. Will continue to follow and progress per current POC.    Recommendations for follow up therapy are one component of a multi-disciplinary discharge planning process, led by the attending physician.  Recommendations may be updated based on patient status, additional functional criteria and insurance authorization.  Follow Up Recommendations  Skilled nursing-short term rehab (<3 hours/day)     Assistance Recommended at Discharge Frequent or constant Supervision/Assistance  Patient can return home with the following Assistance with cooking/housework;Assist for transportation;Help with stairs or ramp for entrance;Two people to help with walking and/or transfers;Two people to help with bathing/dressing/bathroom   Equipment Recommendations  None recommended by PT       Precautions / Restrictions Precautions Precautions: Fall Restrictions Weight Bearing Restrictions: No     Mobility  Bed Mobility Overal bed mobility: Needs Assistance Bed Mobility: Supine  to Sit  Supine to sit: Mod assist, HOB elevated Sit to supine: Max assist  General bed mobility comments: pt requires amx asist to progress LEs in bed    Transfers Overall transfer level: Needs assistance Equipment used: Rolling walker (2 wheels) Transfers: Sit to/from Stand, Bed to chair/wheelchair/BSC Sit to Stand: Max assist, +2 physical assistance, +2 safety/equipment      Lateral/Scoot Transfers: With slide board, Mod assist General transfer comment: pt stood from lower recliner height with max assist +2. unable to take steps to pivot back to EOB. Elected to use slideboard to return to bed from recliner. She required mod assist with slideboard.    Ambulation/Gait Ambulation/Gait assistance: +2 safety/equipment, Min assist Gait Distance (Feet): 75 Feet Assistive device: Rolling walker (2 wheels) Gait Pattern/deviations: Step-to pattern, Knee hyperextension - right, Knee hyperextension - left, Wide base of support Gait velocity: decreased  General Gait Details: unable due to fatigue   Balance Overall balance assessment: Needs assistance Sitting-balance support: Feet supported, Bilateral upper extremity supported Sitting balance-Leahy Scale: Fair Sitting balance - Comments: pt lacks core strength but was able to sit EOB with close supervision   Standing balance support: Reliant on assistive device for balance, During functional activity, Bilateral upper extremity supported Standing balance-Leahy Scale: Poor Standing balance comment: pt is reliant on RW for all standing activity       Cognition Arousal/Alertness: Awake/alert Behavior During Therapy: WFL for tasks assessed/performed Overall Cognitive Status: Within Functional Limits for tasks assessed      General Comments: A&Ox4               Pertinent Vitals/Pain Pain Assessment Pain Assessment: No/denies pain     PT Goals (  current goals can now be found in the care plan section) Acute Rehab PT Goals Patient  Stated Goal: [facility] then home Progress towards PT goals: Progressing toward goals    Frequency    7X/week      PT Plan Current plan remains appropriate       AM-PAC PT "6 Clicks" Mobility   Outcome Measure  Help needed turning from your back to your side while in a flat bed without using bedrails?: A Lot Help needed moving from lying on your back to sitting on the side of a flat bed without using bedrails?: A Lot Help needed moving to and from a bed to a chair (including a wheelchair)?: A Lot Help needed standing up from a chair using your arms (e.g., wheelchair or bedside chair)?: A Lot Help needed to walk in hospital room?: A Lot Help needed climbing 3-5 steps with a railing? : Total 6 Click Score: 11    End of Session Equipment Utilized During Treatment: Gait belt Activity Tolerance: Patient limited by fatigue Patient left: in bed;with call bell/phone within reach;with bed alarm set Nurse Communication: Mobility status PT Visit Diagnosis: Difficulty in walking, not elsewhere classified (R26.2);Muscle weakness (generalized) (M62.81);Other abnormalities of gait and mobility (R26.89)     Time: 6294-7654 PT Time Calculation (min) (ACUTE ONLY): 12 min  Charges:  $Gait Training: 8-22 mins $Therapeutic Activity: 8-22 mins                     Jetta Lout PTA 11/19/21, 4:46 PM

## 2021-11-20 LAB — BASIC METABOLIC PANEL
Anion gap: 4 — ABNORMAL LOW (ref 5–15)
BUN: 37 mg/dL — ABNORMAL HIGH (ref 6–20)
CO2: 27 mmol/L (ref 22–32)
Calcium: 8.8 mg/dL — ABNORMAL LOW (ref 8.9–10.3)
Chloride: 111 mmol/L (ref 98–111)
Creatinine, Ser: 0.85 mg/dL (ref 0.44–1.00)
GFR, Estimated: >60 mL/min (ref >60)
Glucose, Bld: 133 mg/dL — ABNORMAL HIGH (ref 70–99)
Potassium: 4.4 mmol/L (ref 3.5–5.1)
Sodium: 142 mmol/L (ref 135–145)

## 2021-11-20 LAB — AEROBIC/ANAEROBIC CULTURE W GRAM STAIN (SURGICAL/DEEP WOUND)

## 2021-11-20 LAB — CULTURE, BLOOD (ROUTINE X 2)
Culture: NO GROWTH
Culture: NO GROWTH
Special Requests: ADEQUATE
Special Requests: ADEQUATE

## 2021-11-20 LAB — GLUCOSE, CAPILLARY: Glucose-Capillary: 119 mg/dL — ABNORMAL HIGH (ref 70–99)

## 2021-11-20 LAB — CBC
HCT: 38.8 % (ref 36.0–46.0)
Hemoglobin: 13.6 g/dL (ref 12.0–15.0)
MCH: 26.6 pg (ref 26.0–34.0)
MCHC: 35.1 g/dL (ref 30.0–36.0)
MCV: 75.9 fL — ABNORMAL LOW (ref 80.0–100.0)
Platelets: 281 10*3/uL (ref 150–400)
RBC: 5.11 MIL/uL (ref 3.87–5.11)
RDW: 14.1 % (ref 11.5–15.5)
WBC: 9.4 10*3/uL (ref 4.0–10.5)
nRBC: 0 % (ref 0.0–0.2)

## 2021-11-20 LAB — MAGNESIUM: Magnesium: 2.3 mg/dL (ref 1.7–2.4)

## 2021-11-20 MED ORDER — PREDNISONE 20 MG PO TABS
40.0000 mg | ORAL_TABLET | Freq: Every day | ORAL | Status: DC
  Administered 2021-11-21: 40 mg via ORAL
  Filled 2021-11-20: qty 2

## 2021-11-20 MED ORDER — PREDNISONE 10 MG PO TABS
60.0000 mg | ORAL_TABLET | Freq: Every day | ORAL | Status: AC
Start: 2021-11-20 — End: 2021-11-20
  Administered 2021-11-20: 60 mg via ORAL
  Filled 2021-11-20: qty 1

## 2021-11-20 MED ORDER — PREDNISONE 10 MG PO TABS
10.0000 mg | ORAL_TABLET | Freq: Every day | ORAL | Status: DC
Start: 2021-11-27 — End: 2021-11-21

## 2021-11-20 MED ORDER — PREDNISONE 20 MG PO TABS: 20.0000 mg | ORAL_TABLET | Freq: Every day | ORAL | Status: DC

## 2021-11-20 NOTE — Progress Notes (Addendum)
PROGRESS NOTE    Lisa Williams  YTK:[id number] DOB: 08/03/1960 DOA: 11/14/2021 PCP: Lauro Regulus, MD    Brief Narrative:  61 year old female with history of hypertension, multiple sclerosis with lower leg spasticity and weakness, independent at home with use of wheelchair and walker currently on [place] presented with extreme weakness unable to use her legs and painful swelling on her left groin for the last few days.  In the emergency room fairly stable.  I&D done by ER physician, started on antibiotics and also on high-dose IV steroids for suspected MS flare.  Admitted with neurology consultation. 7/15- last day of IV steroids, will transition to taper, will need f/u with Neuro. Cont Augementin for groin abscess s/p I&D; Plan for SNF/rehab 7/17 dc pending 7/16- prednisone taper for MS flare. Continue ABX for L groin wound, still able to be packed. Plan for d/c 7/17, hopefully to SNF Assessment & Plan:   Left groin abscess: s/p I&D.  Daily dressing and packing.  Blood cultures negative.  Local culture with Staphylococcus lugdunensis.  Final sensitivity pending.  Due to immunocompromised status, treated with broad-spectrum IV antibiotics.   Adequately clinically improved.   Continue local wound care.    Augmentin x5 days.    Multiple sclerosis exacerbation: Due to #1.  Treated with high-dose IV steroids 1 g daily, neurology recommended 5 days of therapy.  Already improving.  Appropriate for rehab.  Patient can be transferred to inpatient rehab if they can administer IV steroids for total of 5 days--->transition to oral taper.  Abnormal LFTs: Stable and chronic.  Essential hypertension: Stable on telmisartan.  Bradycardia: Sinus bradycardia.  Asymptomatic.  Heart rate more than 40 at rest, occasionally less than 40 at sleep but never less than 30.  Monitor.  Not on any rate control medications.  Medically stable. If able to go to CIR, she can take additional 1 dose of  steroids tomorrow at [facility]. If she needs to go to a SNF, she can go tomorrow after completing IV steroid.   DVT prophylaxis:   Lovenox   Code Status: Full code Family Communication: None Disposition Plan: Status is: Inpatient Remains inpatient appropriate because: Needs rehab.     Consultants:  Neurology  Procedures:  I&D left groin  Antimicrobials:  Vancomycin and cefepime 7/10--7/14 Augmentin 7/14---   Subjective: NAEON.   Objective: Vitals:   11/19/21 0814 11/19/21 1625 11/19/21 2002 11/20/21 0627  BP: (!) 150/72 (!) 150/70 (!) 148/70 (!) 165/73  Pulse: (!) 47 (!) 57 (!) 58 (!) 47  Resp: 16  20 16   Temp: 97.7 F (36.5 C) 98.2 F (36.8 C) 97.6 F (36.4 C) 97.8 F (36.6 C)  TempSrc:   Oral Oral  SpO2: 97% 99% 97% 99%  Weight:      Height:        Intake/Output Summary (Last 24 hours) at 11/20/2021 0804 Last data filed at 11/20/2021 0713 Gross per 24 hour  Intake 240 ml  Output 700 ml  Net -460 ml    Filed Weights   11/15/21 0500  Weight: 119 kg    Examination:  General: Looks fairly comfortable.  Laying in bed.  On room air. Cardiovascular: S1-S2 normal.  Regular rate rhythm. Respiratory: Bilateral clear.  No added sounds. Gastrointestinal: Soft.  Nontender. Ext: No edema or cyanosis.  No deformities. Neuro: Alert oriented x4.  Normal upper extremity strength and sensation. Bilateral lower extremity 3/5, generalized weakness. Left groin with packed dressing, dry dressing.  No surrounding fluctuation. Clean  and dry.  Nontender.     Data Reviewed: I have personally reviewed following labs and imaging studies  CBC: Recent Labs  Lab 11/16/21 0503 11/17/21 0626 11/18/21 0430 11/19/21 0604 11/20/21 0528  WBC 7.3 9.9 9.0 9.2 9.4  HGB 12.6 12.8 12.5 12.9 13.6  HCT 36.3 37.6 35.6* 35.9* 38.8  MCV 76.1* 76.4* 75.9* 73.4* 75.9*  PLT 254 274 269 266 [id number]    Basic Metabolic Panel: Recent Labs  Lab 11/16/21 0503 11/17/21 0626 11/18/21 0430  11/19/21 0604 11/20/21 0528  NA 141 141 142 137 142  K 4.1 4.3 4.3 4.2 4.4  CL 113* 112* 116* 112* 111  CO2 22 24 23 22 27   GLUCOSE 138* 135* 131* 121* 133*  BUN 25* 25* 29* 31* 37*  CREATININE 0.61 0.75 0.64 0.69 0.85  CALCIUM 9.2 9.0 8.7* 8.7* 8.8*  MG 2.2 2.2 2.3 2.4 2.3    GFR: Estimated Creatinine Clearance: 100.1 mL/min (by C-G formula based on SCr of 0.85 mg/dL). Liver Function Tests: Recent Labs  Lab 11/14/21 1758  AST 52*  ALT 88*  ALKPHOS 146*  BILITOT 1.0  PROT 6.8  ALBUMIN 4.0    No results for input(s): "LIPASE", "AMYLASE" in the last 168 hours. No results for input(s): "AMMONIA" in the last 168 hours. Coagulation Profile: Recent Labs  Lab 11/14/21 2357  INR 1.1    Cardiac Enzymes: No results for input(s): "CKTOTAL", "CKMB", "CKMBINDEX", "TROPONINI" in the last 168 hours. BNP (last 3 results) No results for input(s): "PROBNP" in the last 8760 hours. HbA1C: No results for input(s): "HGBA1C" in the last 72 hours. CBG: No results for input(s): "GLUCAP" in the last 168 hours. Lipid Profile: No results for input(s): "CHOL", "HDL", "LDLCALC", "TRIG", "CHOLHDL", "LDLDIRECT" in the last 72 hours. Thyroid Function Tests: No results for input(s): "TSH", "T4TOTAL", "FREET4", "T3FREE", "THYROIDAB" in the last 72 hours. Anemia Panel: No results for input(s): "VITAMINB12", "FOLATE", "FERRITIN", "TIBC", "IRON", "RETICCTPCT" in the last 72 hours. Sepsis Labs: Recent Labs  Lab 11/14/21 2357  LATICACIDVEN 0.9     Recent Results (from the past 240 hour(s))  Blood Culture (routine x 2)     Status: None   Collection Time: 11/14/21 11:57 PM   Specimen: BLOOD  Result Value Ref Range Status   Specimen Description BLOOD RIGHT ASSIST CONTROL  Final   Special Requests   Final    BOTTLES DRAWN AEROBIC AND ANAEROBIC Blood Culture adequate volume   Culture   Final    NO GROWTH 5 DAYS Performed at [facility], [address], [place], [place] [address]     Report Status 11/20/2021 FINAL  Final  Blood Culture (routine x 2)     Status: None   Collection Time: 11/14/21 11:57 PM   Specimen: BLOOD  Result Value Ref Range Status   Specimen Description BLOOD LEFT HAND  Final   Special Requests   Final    BOTTLES DRAWN AEROBIC AND ANAEROBIC Blood Culture adequate volume   Culture   Final    NO GROWTH 5 DAYS Performed at [facility], [address] [place], [place] [address]    Report Status 11/20/2021 FINAL  Final  Urine Culture     Status: None   Collection Time: 11/15/21  1:30 AM   Specimen: Urine, Random  Result Value Ref Range Status   Specimen Description   Final    URINE, RANDOM Performed at [facility], [address] [place], [place] [address]  Special Requests   Final    NONE Performed at [facility], [address], [place], [place] [address]    Culture   Final    NO GROWTH Performed at [facility], [address], [place], [place] [address]    Report Status 11/16/2021 FINAL  Final  Aerobic/Anaerobic Culture w Gram Stain (surgical/deep wound)     Status: None (Preliminary result)   Collection Time: 11/15/21  1:30 AM   Specimen: Abscess  Result Value Ref Range Status   Specimen Description   Final    ABSCESS GROIN LEFT Performed at [facility], [address] [place], [place] [address]    Special Requests   Final    NONE Performed at [facility], [address] [place], [place] [address]    Gram Stain   Final    FEW WBC PRESENT,BOTH PMN AND MONONUCLEAR FEW GRAM POSITIVE COCCI IN PAIRS FEW GRAM NEGATIVE RODS Performed at [facility], [address], [place], [place] [address]    Culture   Final    RARE STAPHYLOCOCCUS LUGDUNENSIS FEW ACTINOMYCES SPECIES Standardized susceptibility testing for this organism is not available. NO ANAEROBES ISOLATED; CULTURE IN PROGRESS FOR 5 DAYS    Report Status PENDING  Incomplete   Organism ID, Bacteria  STAPHYLOCOCCUS LUGDUNENSIS  Final      Susceptibility   Staphylococcus lugdunensis - MIC*    CIPROFLOXACIN <=0.5 SENSITIVE Sensitive     ERYTHROMYCIN >=8 RESISTANT Resistant     GENTAMICIN <=0.5 SENSITIVE Sensitive     OXACILLIN 0.5 SENSITIVE Sensitive     TETRACYCLINE <=1 SENSITIVE Sensitive     VANCOMYCIN <=0.5 SENSITIVE Sensitive     TRIMETH/SULFA <=10 SENSITIVE Sensitive     CLINDAMYCIN >=8 RESISTANT Resistant     RIFAMPIN <=0.5 SENSITIVE Sensitive     Inducible Clindamycin NEGATIVE Sensitive     * RARE STAPHYLOCOCCUS LUGDUNENSIS         Radiology Studies: No results found.     Scheduled Meds:  acidophilus  2 capsule Oral TID   amoxicillin-clavulanate  1 tablet Oral Q12H   baclofen  20 mg Oral TID   cholecalciferol  5,000 Units Oral Daily   enoxaparin (LOVENOX) injection  0.5 mg/kg Subcutaneous Q24H   famotidine  20 mg Oral BID   gabapentin  100 mg Oral BID   irbesartan  75 mg Oral Daily   oxybutynin  10 mg Oral Daily   polyethylene glycol  17 g Oral Daily   predniSONE  60 mg Oral Q breakfast   And   [START ON 11/21/2021] predniSONE  40 mg Oral Q breakfast   And   [START ON 11/24/2021] predniSONE  20 mg Oral Q breakfast   And   [START ON 11/27/2021] predniSONE  10 mg Oral Q breakfast   Siponimod Fumarate  2 mg Oral Daily   tiZANidine  4 mg Oral BID   Continuous Infusions:     LOS: 5 days    Time spent: 35 minutes    Thomas Hoff, MD [facility] Pager [phone]

## 2021-11-20 NOTE — Plan of Care (Signed)
She has completed 5 days of IV Solumedrol at 1000 mg per day. No prednisone taper is indicated. Agree with Hospitalist plan for evaluation by Rehabilitation Medicine for possible discharge to CIR tomorrow.   Electronically signed: Dr. Caryl Pina

## 2021-11-20 NOTE — Progress Notes (Addendum)
Physical Therapy Treatment Patient Details Name: Lisa Williams MRN: [id number] DOB: 01/11/1961 Today's Date: 11/20/2021   History of Present Illness Pt is a 61 y.o. female with PMH including HTN, MS symptomatic mostly for spasticity and lower extremity weakness, independent at home with wheelchair and walker and drives. Pt presented to the ED on 11/14/21 with concern for multiple sclerosis exacerbation. Pt underwent I&D in ED for left groin abscess.    PT Comments    Pt received sitting EOB & agreeable to tx but requesting no standing or gait today 2/2 episode of feeling unwell this morning despite feeling better now. Pt engages in BLE & core exercises with cuing for technique from PT. Pt requires mod assist for sit>supine at end of session to elevate BLE onto bed but this appears to be a pre-existing need. Will continue to follow pt acutely to address strengthening, balance, and gait with LRAD.  Adjusted pt's frequency from QD to 2x/week (pt anticipating d/c to SNF vs CIR & per therapy protocol).    Recommendations for follow up therapy are one component of a multi-disciplinary discharge planning process, led by the attending physician.  Recommendations may be updated based on patient status, additional functional criteria and insurance authorization.  Follow Up Recommendations  Skilled nursing-short term rehab (<3 hours/day)     Assistance Recommended at Discharge Frequent or constant Supervision/Assistance  Patient can return home with the following Assistance with cooking/housework;Assist for transportation;Help with stairs or ramp for entrance;Two people to help with walking and/or transfers;Two people to help with bathing/dressing/bathroom   Equipment Recommendations  None recommended by PT    Recommendations for Other Services       Precautions / Restrictions Precautions Precautions: Fall Precaution Comments: B AFOs Restrictions Weight Bearing Restrictions: No      Mobility  Bed Mobility Overal bed mobility: Needs Assistance Bed Mobility: Sit to Supine       Sit to supine: Mod assist (to elevate BLE onto bed (appears to be a pre-existing need))        Transfers                        Ambulation/Gait                   Stairs             Wheelchair Mobility    Modified Rankin (Stroke Patients Only)       Balance Overall balance assessment: Needs assistance Sitting-balance support: Feet supported Sitting balance-Leahy Scale: Good                                      Cognition Arousal/Alertness: Awake/alert Behavior During Therapy: WFL for tasks assessed/performed Overall Cognitive Status: Within Functional Limits for tasks assessed                                          Exercises General Exercises - Lower Extremity Long Arc Quad: AROM, Strengthening, Both, 10 reps, Seated (with 3 second hold) Hip ABduction/ADduction: AROM, Strengthening, Both, 10 reps, Seated (hip adduction pillow squeezes x 10 with 3 second hold) Other Exercises Other Exercises: seated core rotations (cuing to rotate core vs moving arms) x 10 reps, 10 modified sit ups with cuing for increased core activation x 10  General Comments        Pertinent Vitals/Pain Pain Assessment Pain Assessment: No/denies pain    Home Living                          Prior Function            PT Goals (current goals can now be found in the care plan section) Acute Rehab PT Goals Patient Stated Goal: [facility] at [facility] then home PT Goal Formulation: With patient Time For Goal Achievement: 11/29/21 Potential to Achieve Goals: Good Progress towards PT goals: Progressing toward goals    Frequency    Min 2X/week      PT Plan Frequency needs to be updated    Co-evaluation              AM-PAC PT "6 Clicks" Mobility   Outcome Measure  Help needed turning from your back to your  side while in a flat bed without using bedrails?: A Little Help needed moving from lying on your back to sitting on the side of a flat bed without using bedrails?: A Lot Help needed moving to and from a bed to a chair (including a wheelchair)?: A Lot Help needed standing up from a chair using your arms (e.g., wheelchair or bedside chair)?: A Lot Help needed to walk in hospital room?: A Lot Help needed climbing 3-5 steps with a railing? : Total 6 Click Score: 12    End of Session   Activity Tolerance: Patient tolerated treatment well Patient left: in bed;with call bell/phone within reach;with bed alarm set   PT Visit Diagnosis: Difficulty in walking, not elsewhere classified (R26.2);Muscle weakness (generalized) (M62.81);Other abnormalities of gait and mobility (R26.89)     Time: 6433-2951 PT Time Calculation (min) (ACUTE ONLY): 12 min  Charges:  $Therapeutic Exercise: 8-22 mins                     Aleda Grana, PT, DPT 11/20/21, 9:42 AM   Sandi Mariscal 11/20/2021, 9:41 AM

## 2021-11-21 DIAGNOSIS — M79662 Pain in left lower leg: Secondary | ICD-10-CM | POA: Diagnosis not present

## 2021-11-21 DIAGNOSIS — B432 Subcutaneous pheomycotic abscess and cyst: Secondary | ICD-10-CM | POA: Diagnosis not present

## 2021-11-21 DIAGNOSIS — K5901 Slow transit constipation: Secondary | ICD-10-CM | POA: Diagnosis not present

## 2021-11-21 DIAGNOSIS — R1311 Dysphagia, oral phase: Secondary | ICD-10-CM | POA: Diagnosis not present

## 2021-11-21 DIAGNOSIS — R2689 Other abnormalities of gait and mobility: Secondary | ICD-10-CM | POA: Diagnosis not present

## 2021-11-21 DIAGNOSIS — M792 Neuralgia and neuritis, unspecified: Secondary | ICD-10-CM | POA: Diagnosis not present

## 2021-11-21 DIAGNOSIS — M62838 Other muscle spasm: Secondary | ICD-10-CM | POA: Diagnosis not present

## 2021-11-21 DIAGNOSIS — M25572 Pain in left ankle and joints of left foot: Secondary | ICD-10-CM | POA: Diagnosis not present

## 2021-11-21 DIAGNOSIS — M6281 Muscle weakness (generalized): Secondary | ICD-10-CM | POA: Diagnosis not present

## 2021-11-21 DIAGNOSIS — I10 Essential (primary) hypertension: Secondary | ICD-10-CM | POA: Diagnosis not present

## 2021-11-21 DIAGNOSIS — K219 Gastro-esophageal reflux disease without esophagitis: Secondary | ICD-10-CM | POA: Diagnosis not present

## 2021-11-21 DIAGNOSIS — M79671 Pain in right foot: Secondary | ICD-10-CM | POA: Diagnosis not present

## 2021-11-21 DIAGNOSIS — D649 Anemia, unspecified: Secondary | ICD-10-CM | POA: Diagnosis not present

## 2021-11-21 DIAGNOSIS — R2681 Unsteadiness on feet: Secondary | ICD-10-CM | POA: Diagnosis not present

## 2021-11-21 DIAGNOSIS — G35 Multiple sclerosis: Secondary | ICD-10-CM | POA: Diagnosis not present

## 2021-11-21 DIAGNOSIS — K592 Neurogenic bowel, not elsewhere classified: Secondary | ICD-10-CM | POA: Diagnosis not present

## 2021-11-21 DIAGNOSIS — R2241 Localized swelling, mass and lump, right lower limb: Secondary | ICD-10-CM | POA: Diagnosis not present

## 2021-11-21 DIAGNOSIS — R5381 Other malaise: Secondary | ICD-10-CM | POA: Diagnosis not present

## 2021-11-21 DIAGNOSIS — R001 Bradycardia, unspecified: Secondary | ICD-10-CM | POA: Diagnosis not present

## 2021-11-21 DIAGNOSIS — L02214 Cutaneous abscess of groin: Secondary | ICD-10-CM | POA: Diagnosis not present

## 2021-11-21 DIAGNOSIS — E559 Vitamin D deficiency, unspecified: Secondary | ICD-10-CM | POA: Diagnosis not present

## 2021-11-21 DIAGNOSIS — E46 Unspecified protein-calorie malnutrition: Secondary | ICD-10-CM | POA: Diagnosis not present

## 2021-11-21 DIAGNOSIS — M79672 Pain in left foot: Secondary | ICD-10-CM | POA: Diagnosis not present

## 2021-11-21 DIAGNOSIS — R600 Localized edema: Secondary | ICD-10-CM | POA: Diagnosis not present

## 2021-11-21 DIAGNOSIS — N319 Neuromuscular dysfunction of bladder, unspecified: Secondary | ICD-10-CM | POA: Diagnosis not present

## 2021-11-21 DIAGNOSIS — R41841 Cognitive communication deficit: Secondary | ICD-10-CM | POA: Diagnosis not present

## 2021-11-21 DIAGNOSIS — M810 Age-related osteoporosis without current pathological fracture: Secondary | ICD-10-CM | POA: Diagnosis not present

## 2021-11-21 MED ORDER — PREDNISONE 20 MG PO TABS
ORAL_TABLET | ORAL | 0 refills | Status: AC
Start: 2021-11-22 — End: 2021-11-28

## 2021-11-21 MED ORDER — RISAQUAD PO CAPS: 2.0000 | ORAL_CAPSULE | Freq: Three times a day (TID) | ORAL | 0 refills | Status: AC

## 2021-11-21 MED ORDER — POLYETHYLENE GLYCOL 3350 17 G PO PACK: 17.0000 g | PACK | Freq: Every day | ORAL | 0 refills | Status: DC

## 2021-11-21 MED ORDER — FAMOTIDINE 20 MG PO TABS: 20.0000 mg | ORAL_TABLET | Freq: Two times a day (BID) | ORAL | 0 refills | Status: DC

## 2021-11-21 MED ORDER — AMOXICILLIN-POT CLAVULANATE 875-125 MG PO TABS: 1.0000 | ORAL_TABLET | Freq: Two times a day (BID) | ORAL | 0 refills | Status: AC

## 2021-11-21 NOTE — Progress Notes (Signed)
PT Cancellation Note  Patient Details Name: Lisa Williams MRN: [id number] DOB: 24-Feb-1961   Cancelled Treatment:    Reason Eval/Treat Not Completed: Patient declined, no reason specified. Patient politely declined as she wants to get washed up and dressed in preparation for discharge to SNF today. PT will continue to follow while in the hospital.   Donna Bernard, PT, MPT  Ina Homes 11/21/2021, 9:45 AM

## 2021-11-21 NOTE — TOC Progression Note (Signed)
Transition of Care [facility]) - Progression Note    Patient Details  Name: Lisa Williams MRN: [id number] Date of Birth: May 31, 1960  Transition of Care [facility]) CM/SW Contact  Caryn Section, RN Phone Number: 11/21/2021, 12:03 PM  Clinical Narrative:   Patient will discharge today to SNF ([facility]) today, okay to accept per Rickey.  Patient has authorization from [facility] to transfer by EMS.  Patient is amenable, and states that she has contacted her friends and family to notify them of transfer.         Expected Discharge Plan and Services           Expected Discharge Date: 11/21/21                                     Social Determinants of Health (SDOH) Interventions    Readmission Risk Interventions     No data to display

## 2021-11-21 NOTE — Care Management Important Message (Signed)
Important Message  Patient Details  Name: Lisa Williams MRN: [id number] Date of Birth: April 22, 1961   Medicare Important Message Given:  Yes     Johnell Comings 11/21/2021, 1:00 PM

## 2021-11-21 NOTE — Progress Notes (Signed)
Pt discharging to [facility], report given to [facility] LPN, EMS called for transport, pt with no complaints

## 2021-11-21 NOTE — Discharge Summary (Addendum)
Physician Discharge Summary   Patient: Lisa Williams MRN: [id number] DOB: 1960-07-20  Admit date:     11/14/2021  Discharge date: 11/21/21  Discharge Physician: Thomas Hoff   PCP: Lauro Regulus, MD   Recommendations at discharge:    Wound Care: Pack wound with iodine strip, cover with gauze, change daily.   Discharge Diagnoses: Principal Problem:   Multiple sclerosis exacerbation (HCC) Active Problems:   Abscess of left groin   Essential hypertension   Abnormal LFTs  Resolved Problems:   * No resolved hospital problems. *  Hospital Course: 61 year old female with history of hypertension, multiple sclerosis with lower leg spasticity and weakness, independent at home with use of wheelchair and walker currently on Mayzent presented with extreme weakness unable to use her legs and painful swelling on her left groin for the last few days.  In the emergency room fairly stable.  I&D done by ER physician, started on antibiotics and also on high-dose IV steroids for suspected MS flare.  Admitted with neurology consultation. 7/15- last day of IV steroids, will transition to taper, will need f/u with Neuro. Cont Augementin for groin abscess s/p I&D; Plan for SNF/rehab 7/17 dc pending 7/16- prednisone taper for MS flare. Continue ABX for L groin wound, still able to be packed. Plan for d/c 7/17, hopefully to SNF   Assessment and Plan: Left groin abscess: s/p I&D.  Daily dressing and packing.  Blood cultures negative.  Local culture with Staphylococcus lugdunensis.  Final sensitivity pending.  Due to immunocompromised status, treated with broad-spectrum IV antibiotics.   Adequately clinically improved.   Continue local wound care. Continue augmentin at dc - Wound care: Pack wound with iodine strip, cover with gauze, change daily.     Multiple sclerosis exacerbation: Due to #1.  Treated with high-dose IV steroids 1 g daily, neurology recommended 5 days of therapy.  Already  improving.  Appropriate for rehab.  Patient can be transferred to inpatient rehab if they can administer IV steroids for total of 5 days--->transition to oral taper.  Abnormal LFTs: Stable and chronic.   Essential hypertension: Stable on telmisartan.   Bradycardia: Sinus bradycardia.  Asymptomatic.  Heart rate more than 40 at rest, occasionally less than 40 at sleep but never less than 30.  Monitor.  Not on any rate control medications.   Medically stable. If able to go to CIR, she can take additional 1 dose of steroids tomorrow at [facility]. If she needs to go to a SNF, she can go tomorrow after completing IV steroid.        Pain control - [employer] Controlled Substance Reporting System database was reviewed. and patient was instructed, not to drive, operate heavy machinery, perform activities at heights, swimming or participation in water activities or provide baby-sitting services while on Pain, Sleep and Anxiety Medications; until their outpatient Physician has advised to do so again. Also recommended to not to take more than prescribed Pain, Sleep and Anxiety Medications.  Consultants: Wound care, Neurology  Procedures performed: I&D wound L Groin Disposition: Skilled nursing facility Diet recommendation:  Discharge Diet Orders (From admission, onward)     Start     Ordered   11/21/21 0000  Diet - low sodium heart healthy        11/21/21 1126           Reg diet DISCHARGE MEDICATION: Allergies as of 11/21/2021       Reactions   Amlodipine Swelling   Betadine [povidone-iodine]  Povidone Iodine Rash        Medication List     TAKE these medications    acetaminophen 500 MG tablet Commonly known as: TYLENOL Take 500-1,000 mg by mouth every 6 (six) hours as needed for moderate pain or headache.   acidophilus Caps capsule Take 2 capsules by mouth 3 (three) times daily for 7 days.   amoxicillin-clavulanate 875-125 MG tablet Commonly known as: AUGMENTIN Take 1  tablet by mouth every 12 (twelve) hours for 3 days.   baclofen 20 MG tablet Commonly known as: LIORESAL Take 1 tablet (20 mg total) by mouth 3 (three) times daily.   Cholecalciferol 25 MCG (1000 UT) tablet Take 5,000 Units by mouth daily.   dantrolene 25 MG capsule Commonly known as: DANTRIUM 1 po bid x 5 d, then 1 po tid x 5d, then 1 po qid x 5d then 2 po tid   famotidine 20 MG tablet Commonly known as: PEPCID Take 1 tablet (20 mg total) by mouth 2 (two) times daily.   gabapentin 100 MG capsule Commonly known as: NEURONTIN Take 1 capsule (100 mg total) by mouth 2 (two) times daily.   Mayzent 2 MG Tabs Generic drug: Siponimod Fumarate Take 2 mg by mouth daily.   oxybutynin 10 MG 24 hr tablet Commonly known as: DITROPAN-XL Take 1 tablet (10 mg total) by mouth daily.   polyethylene glycol 17 g packet Commonly known as: MIRALAX / GLYCOLAX Take 17 g by mouth daily. Start taking on: November 22, 2021   predniSONE 20 MG tablet Commonly known as: DELTASONE Take 2 tablets (40 mg total) by mouth daily with breakfast for 2 days, THEN 1 tablet (20 mg total) daily with breakfast for 2 days, THEN 0.5 tablets (10 mg total) daily with breakfast for 2 days. Start taking on: November 22, 2021   telmisartan 40 MG tablet Commonly known as: MICARDIS Take 40 mg by mouth daily.   tiZANidine 4 MG tablet Commonly known as: ZANAFLEX One po qAM, one po qPM and 2 po qHS What changed:  how much to take how to take this when to take this additional instructions               Discharge Care Instructions  (From admission, onward)           Start     Ordered   11/21/21 0000  Discharge wound care:       Comments: Pack wound with iodine strip, cover with gauze, change daily   11/21/21 1126            Discharge Exam: Filed Weights   11/15/21 0500  Weight: 119 kg     General: Looks fairly comfortable.  Laying in bed.  On room air. Cardiovascular: S1-S2 normal.  Regular rate  rhythm. Respiratory: Bilateral clear.  No added sounds. Gastrointestinal: Soft.  Nontender. Ext: No edema or cyanosis.  No deformities. Neuro: Alert oriented x4.  Normal upper extremity strength and sensation. Bilateral lower extremity 3/5, generalized weakness. Left groin with packed dressing, dry dressing.  No surrounding fluctuation. Clean and dry.  Nontender.    Condition at discharge: good  The results of significant diagnostics from this hospitalization (including imaging, microbiology, ancillary and laboratory) are listed below for reference.   Imaging Studies: DG Chest Port 1 View  Result Date: 11/14/2021 CLINICAL DATA:  Questionable sepsis - evaluate for abnormality EXAM: PORTABLE CHEST 1 VIEW COMPARISON:  Radiograph 11/21/2020 FINDINGS: Chronic elevation of right hemidiaphragm. No acute airspace disease.  Stable heart size and mediastinal contours. No pulmonary edema, pleural effusion or pneumothorax. No acute osseous abnormalities. IMPRESSION: Chronic elevation of the right hemidiaphragm. No acute abnormality. Electronically Signed   By: Narda Rutherford M.D.   On: 11/14/2021 23:56    Microbiology: Results for orders placed or performed during the hospital encounter of 11/14/21  Blood Culture (routine x 2)     Status: None   Collection Time: 11/14/21 11:57 PM   Specimen: BLOOD  Result Value Ref Range Status   Specimen Description BLOOD RIGHT ASSIST CONTROL  Final   Special Requests   Final    BOTTLES DRAWN AEROBIC AND ANAEROBIC Blood Culture adequate volume   Culture   Final    NO GROWTH 5 DAYS Performed at [facility], [address] [place], [place] [address]    Report Status 11/20/2021 FINAL  Final  Blood Culture (routine x 2)     Status: None   Collection Time: 11/14/21 11:57 PM   Specimen: BLOOD  Result Value Ref Range Status   Specimen Description BLOOD LEFT HAND  Final   Special Requests   Final    BOTTLES DRAWN AEROBIC AND ANAEROBIC Blood Culture  adequate volume   Culture   Final    NO GROWTH 5 DAYS Performed at [facility], [address], [place], [place] [address]    Report Status 11/20/2021 FINAL  Final  Urine Culture     Status: None   Collection Time: 11/15/21  1:30 AM   Specimen: Urine, Random  Result Value Ref Range Status   Specimen Description   Final    URINE, RANDOM Performed at [facility], [address], [place], [place] [address]    Special Requests   Final    NONE Performed at [facility], [address] [place], [place] [address]    Culture   Final    NO GROWTH Performed at [facility], [address] [place], [place] [address]    Report Status 11/16/2021 FINAL  Final  Aerobic/Anaerobic Culture w Gram Stain (surgical/deep wound)     Status: None   Collection Time: 11/15/21  1:30 AM   Specimen: Abscess  Result Value Ref Range Status   Specimen Description   Final    ABSCESS GROIN LEFT Performed at [facility], [address], [place], [place] [address]    Special Requests   Final    NONE Performed at [facility], [address] [place], [place] [address]    Gram Stain   Final    FEW WBC PRESENT,BOTH PMN AND MONONUCLEAR FEW GRAM POSITIVE COCCI IN PAIRS FEW GRAM NEGATIVE RODS    Culture   Final    RARE STAPHYLOCOCCUS LUGDUNENSIS FEW ACTINOMYCES SPECIES Standardized susceptibility testing for this organism is not available. NO ANAEROBES ISOLATED Performed at [facility], [address], [place], [place] [address]    Report Status 11/20/2021 FINAL  Final   Organism ID, Bacteria STAPHYLOCOCCUS LUGDUNENSIS  Final      Susceptibility   Staphylococcus lugdunensis - MIC*    CIPROFLOXACIN <=0.5 SENSITIVE Sensitive     ERYTHROMYCIN >=8 RESISTANT Resistant     GENTAMICIN <=0.5 SENSITIVE Sensitive     OXACILLIN 0.5 SENSITIVE Sensitive     TETRACYCLINE <=1 SENSITIVE Sensitive     VANCOMYCIN <=0.5 SENSITIVE Sensitive      TRIMETH/SULFA <=10 SENSITIVE Sensitive     CLINDAMYCIN >=8 RESISTANT Resistant     RIFAMPIN <=0.5  SENSITIVE Sensitive     Inducible Clindamycin NEGATIVE Sensitive     * RARE STAPHYLOCOCCUS LUGDUNENSIS   *Note: Due to a large number of results and/or encounters for the requested time period, some results have not been displayed. A complete set of results can be found in Results Review.    Labs: CBC: Recent Labs  Lab 11/16/21 0503 11/17/21 0626 11/18/21 0430 11/19/21 0604 11/20/21 0528  WBC 7.3 9.9 9.0 9.2 9.4  HGB 12.6 12.8 12.5 12.9 13.6  HCT 36.3 37.6 35.6* 35.9* 38.8  MCV 76.1* 76.4* 75.9* 73.4* 75.9*  PLT 254 274 269 266 281   Basic Metabolic Panel: Recent Labs  Lab 11/16/21 0503 11/17/21 0626 11/18/21 0430 11/19/21 0604 11/20/21 0528  NA 141 141 142 137 142  K 4.1 4.3 4.3 4.2 4.4  CL 113* 112* 116* 112* 111  CO2 22 24 23 22 27   GLUCOSE 138* 135* 131* 121* 133*  BUN 25* 25* 29* 31* 37*  CREATININE 0.61 0.75 0.64 0.69 0.85  CALCIUM 9.2 9.0 8.7* 8.7* 8.8*  MG 2.2 2.2 2.3 2.4 2.3   Liver Function Tests: Recent Labs  Lab 11/14/21 1758  AST 52*  ALT 88*  ALKPHOS 146*  BILITOT 1.0  PROT 6.8  ALBUMIN 4.0   CBG: Recent Labs  Lab 11/20/21 0853  GLUCAP 119*    Discharge time spent: less than 30 minutes.  Signed: 11/22/21, MD [facility] 11/21/2021

## 2021-11-22 ENCOUNTER — Ambulatory Visit: Payer: PPO

## 2021-11-22 DIAGNOSIS — L02214 Cutaneous abscess of groin: Secondary | ICD-10-CM | POA: Diagnosis not present

## 2021-11-22 DIAGNOSIS — R5381 Other malaise: Secondary | ICD-10-CM | POA: Diagnosis not present

## 2021-11-22 DIAGNOSIS — R001 Bradycardia, unspecified: Secondary | ICD-10-CM | POA: Diagnosis not present

## 2021-11-22 DIAGNOSIS — G35 Multiple sclerosis: Secondary | ICD-10-CM | POA: Diagnosis not present

## 2021-11-23 DIAGNOSIS — L02214 Cutaneous abscess of groin: Secondary | ICD-10-CM | POA: Diagnosis not present

## 2021-11-23 DIAGNOSIS — M62838 Other muscle spasm: Secondary | ICD-10-CM | POA: Diagnosis not present

## 2021-11-23 DIAGNOSIS — R5381 Other malaise: Secondary | ICD-10-CM | POA: Diagnosis not present

## 2021-11-23 DIAGNOSIS — G35 Multiple sclerosis: Secondary | ICD-10-CM | POA: Diagnosis not present

## 2021-11-24 ENCOUNTER — Ambulatory Visit: Payer: PPO

## 2021-11-25 DIAGNOSIS — I10 Essential (primary) hypertension: Secondary | ICD-10-CM | POA: Diagnosis not present

## 2021-11-25 DIAGNOSIS — D649 Anemia, unspecified: Secondary | ICD-10-CM | POA: Diagnosis not present

## 2021-11-29 ENCOUNTER — Ambulatory Visit: Payer: PPO

## 2021-11-29 DIAGNOSIS — R600 Localized edema: Secondary | ICD-10-CM | POA: Diagnosis not present

## 2021-12-01 ENCOUNTER — Ambulatory Visit: Payer: PPO

## 2021-12-01 DIAGNOSIS — R2241 Localized swelling, mass and lump, right lower limb: Secondary | ICD-10-CM | POA: Diagnosis not present

## 2021-12-06 ENCOUNTER — Ambulatory Visit: Payer: PPO

## 2021-12-07 DIAGNOSIS — E559 Vitamin D deficiency, unspecified: Secondary | ICD-10-CM | POA: Diagnosis not present

## 2021-12-07 DIAGNOSIS — K5901 Slow transit constipation: Secondary | ICD-10-CM | POA: Diagnosis not present

## 2021-12-07 DIAGNOSIS — R5381 Other malaise: Secondary | ICD-10-CM | POA: Diagnosis not present

## 2021-12-08 ENCOUNTER — Ambulatory Visit: Payer: PPO

## 2021-12-13 ENCOUNTER — Ambulatory Visit: Payer: PPO

## 2021-12-13 DIAGNOSIS — G35 Multiple sclerosis: Secondary | ICD-10-CM | POA: Diagnosis not present

## 2021-12-15 ENCOUNTER — Ambulatory Visit: Payer: PPO

## 2021-12-20 ENCOUNTER — Ambulatory Visit: Payer: PPO

## 2021-12-21 DIAGNOSIS — I10 Essential (primary) hypertension: Secondary | ICD-10-CM | POA: Diagnosis not present

## 2021-12-21 DIAGNOSIS — N319 Neuromuscular dysfunction of bladder, unspecified: Secondary | ICD-10-CM | POA: Diagnosis not present

## 2021-12-21 DIAGNOSIS — G35 Multiple sclerosis: Secondary | ICD-10-CM | POA: Diagnosis not present

## 2021-12-21 DIAGNOSIS — R5381 Other malaise: Secondary | ICD-10-CM | POA: Diagnosis not present

## 2021-12-22 ENCOUNTER — Ambulatory Visit: Payer: PPO

## 2021-12-27 ENCOUNTER — Ambulatory Visit: Payer: PPO

## 2021-12-29 ENCOUNTER — Ambulatory Visit: Payer: PPO

## 2022-01-02 DIAGNOSIS — N643 Galactorrhea not associated with childbirth: Secondary | ICD-10-CM | POA: Diagnosis not present

## 2022-01-02 DIAGNOSIS — I10 Essential (primary) hypertension: Secondary | ICD-10-CM | POA: Diagnosis not present

## 2022-01-02 DIAGNOSIS — Z87891 Personal history of nicotine dependence: Secondary | ICD-10-CM | POA: Diagnosis not present

## 2022-01-02 DIAGNOSIS — N319 Neuromuscular dysfunction of bladder, unspecified: Secondary | ICD-10-CM | POA: Diagnosis not present

## 2022-01-02 DIAGNOSIS — Z90710 Acquired absence of both cervix and uterus: Secondary | ICD-10-CM | POA: Diagnosis not present

## 2022-01-02 DIAGNOSIS — G35 Multiple sclerosis: Secondary | ICD-10-CM | POA: Diagnosis not present

## 2022-01-02 DIAGNOSIS — E8809 Other disorders of plasma-protein metabolism, not elsewhere classified: Secondary | ICD-10-CM | POA: Diagnosis not present

## 2022-01-02 DIAGNOSIS — K5900 Constipation, unspecified: Secondary | ICD-10-CM | POA: Diagnosis not present

## 2022-01-02 DIAGNOSIS — E46 Unspecified protein-calorie malnutrition: Secondary | ICD-10-CM | POA: Diagnosis not present

## 2022-01-02 DIAGNOSIS — E785 Hyperlipidemia, unspecified: Secondary | ICD-10-CM | POA: Diagnosis not present

## 2022-01-02 DIAGNOSIS — M792 Neuralgia and neuritis, unspecified: Secondary | ICD-10-CM | POA: Diagnosis not present

## 2022-01-02 DIAGNOSIS — M810 Age-related osteoporosis without current pathological fracture: Secondary | ICD-10-CM | POA: Diagnosis not present

## 2022-01-03 ENCOUNTER — Ambulatory Visit: Payer: PPO

## 2022-01-05 ENCOUNTER — Ambulatory Visit: Payer: PPO

## 2022-01-05 DIAGNOSIS — G35 Multiple sclerosis: Secondary | ICD-10-CM | POA: Diagnosis not present

## 2022-01-05 DIAGNOSIS — Z0000 Encounter for general adult medical examination without abnormal findings: Secondary | ICD-10-CM | POA: Diagnosis not present

## 2022-01-05 DIAGNOSIS — R7309 Other abnormal glucose: Secondary | ICD-10-CM | POA: Diagnosis not present

## 2022-01-05 DIAGNOSIS — I10 Essential (primary) hypertension: Secondary | ICD-10-CM | POA: Diagnosis not present

## 2022-01-10 ENCOUNTER — Ambulatory Visit: Payer: PPO

## 2022-01-12 ENCOUNTER — Ambulatory Visit: Payer: PPO

## 2022-01-12 DIAGNOSIS — I10 Essential (primary) hypertension: Secondary | ICD-10-CM | POA: Diagnosis not present

## 2022-01-12 DIAGNOSIS — L03031 Cellulitis of right toe: Secondary | ICD-10-CM | POA: Diagnosis not present

## 2022-01-12 DIAGNOSIS — E669 Obesity, unspecified: Secondary | ICD-10-CM | POA: Diagnosis not present

## 2022-01-12 DIAGNOSIS — B351 Tinea unguium: Secondary | ICD-10-CM | POA: Diagnosis not present

## 2022-01-12 DIAGNOSIS — M79674 Pain in right toe(s): Secondary | ICD-10-CM | POA: Diagnosis not present

## 2022-01-12 DIAGNOSIS — M79675 Pain in left toe(s): Secondary | ICD-10-CM | POA: Diagnosis not present

## 2022-01-12 DIAGNOSIS — Z20822 Contact with and (suspected) exposure to covid-19: Secondary | ICD-10-CM | POA: Diagnosis not present

## 2022-01-12 DIAGNOSIS — G35 Multiple sclerosis: Secondary | ICD-10-CM | POA: Diagnosis not present

## 2022-01-12 DIAGNOSIS — L600 Ingrowing nail: Secondary | ICD-10-CM | POA: Diagnosis not present

## 2022-01-12 DIAGNOSIS — Z993 Dependence on wheelchair: Secondary | ICD-10-CM | POA: Diagnosis not present

## 2022-01-16 DIAGNOSIS — N319 Neuromuscular dysfunction of bladder, unspecified: Secondary | ICD-10-CM | POA: Diagnosis not present

## 2022-01-16 DIAGNOSIS — Z90710 Acquired absence of both cervix and uterus: Secondary | ICD-10-CM | POA: Diagnosis not present

## 2022-01-16 DIAGNOSIS — E46 Unspecified protein-calorie malnutrition: Secondary | ICD-10-CM | POA: Diagnosis not present

## 2022-01-16 DIAGNOSIS — M810 Age-related osteoporosis without current pathological fracture: Secondary | ICD-10-CM | POA: Diagnosis not present

## 2022-01-16 DIAGNOSIS — E8809 Other disorders of plasma-protein metabolism, not elsewhere classified: Secondary | ICD-10-CM | POA: Diagnosis not present

## 2022-01-16 DIAGNOSIS — E785 Hyperlipidemia, unspecified: Secondary | ICD-10-CM | POA: Diagnosis not present

## 2022-01-16 DIAGNOSIS — M792 Neuralgia and neuritis, unspecified: Secondary | ICD-10-CM | POA: Diagnosis not present

## 2022-01-16 DIAGNOSIS — G35 Multiple sclerosis: Secondary | ICD-10-CM | POA: Diagnosis not present

## 2022-01-16 DIAGNOSIS — Z87891 Personal history of nicotine dependence: Secondary | ICD-10-CM | POA: Diagnosis not present

## 2022-01-17 ENCOUNTER — Ambulatory Visit: Payer: PPO

## 2022-01-19 ENCOUNTER — Ambulatory Visit: Payer: PPO

## 2022-01-24 ENCOUNTER — Ambulatory Visit: Payer: PPO

## 2022-01-26 ENCOUNTER — Ambulatory Visit: Payer: PPO

## 2022-01-31 ENCOUNTER — Ambulatory Visit: Payer: PPO

## 2022-02-01 ENCOUNTER — Ambulatory Visit (INDEPENDENT_AMBULATORY_CARE_PROVIDER_SITE_OTHER): Payer: PPO | Admitting: Neurology

## 2022-02-01 ENCOUNTER — Encounter: Payer: Self-pay | Admitting: Neurology

## 2022-02-01 VITALS — BP 162/80 | HR 62 | Ht 71.0 in | Wt 261.0 lb

## 2022-02-01 DIAGNOSIS — R269 Unspecified abnormalities of gait and mobility: Secondary | ICD-10-CM

## 2022-02-01 DIAGNOSIS — G35 Multiple sclerosis: Secondary | ICD-10-CM | POA: Diagnosis not present

## 2022-02-01 DIAGNOSIS — R3915 Urgency of urination: Secondary | ICD-10-CM | POA: Diagnosis not present

## 2022-02-01 DIAGNOSIS — Z79899 Other long term (current) drug therapy: Secondary | ICD-10-CM | POA: Diagnosis not present

## 2022-02-01 DIAGNOSIS — N319 Neuromuscular dysfunction of bladder, unspecified: Secondary | ICD-10-CM

## 2022-02-01 NOTE — Progress Notes (Signed)
[facility]  PATIENT: Lisa Williams DOB: October 24, 1960  REFERRING DOCTOR OR PCP: Einar Crow, MD (PCP) SOURCE: Patient, notes from Dr. Anne Hahn, imaging and lab reports, MRI images personally reviewed and summarized below.  _________________________________   HISTORICAL  CHIEF COMPLAINT:  Chief Complaint  Patient presents with   Follow-up    Pt in room # 2 and alone. Pt here today for an f/u.    HISTORY OF PRESENT ILLNESS:  Lisa Williams is a 61 y.o. woman with multiple sclerosis.   She is on Mayzent and tolerates it well.  Update 02/01/2022 She had a recent skin infection an gait and strength worsened.   She was hospitalized with IV antibiotics and then did inpatient Rehab.   She has benefit and we discussed outpt Rehab  Currently she has weakness in both legs, R worse than L.   Her arms are strong.   She drives with hand controls x 10 years.   She is able to walk 60 feet with a walker (was >100).  She has muscle spasms on the right, mostly in the leg, but these bother her less than earlier in the year.   Tizanidine and baclofen has helped some. She takes 4 mg po tid  .She opted not to try dantrolene.  She denies sensory symptoms now but had some   She has urinary urgency helped by oxybutynin.   No recent UTI.  Vision is doing well.  She has never had neuritis.     She has only mild fatigue, sometimes often with heat.  She sleeps well most nights.   Mood and cognition are fine.      MS History: She was diagnosed in 1995 after presenting with right leg weakness and foot drop.   One day, she lost all sensation in her legs.   She saw Dr. Chestine Spore in [place] until his retirement.   She then saw Dr. [name] in [place] and she started to see Dr. Anne Hahn 2 years ago.     She was initially placed on Copaxone x 3 years and then was off all DMTs for many years and then started Rituxan around 2018 to 2019.  At that time, associated with a UTI, she felt much  more weak prompting her to go on a medication.   She started Mayzent since 2020.   She has tolerated it well.      IMAGING: MRI of the brain 03/18/2020 showed multiple T2/FLAIR hyperintense foci in the periventricular, juxtacortical and deep white matter.  There were no infratentorial lesions noted.  None of the foci enhanced.   Due to severe claustrophobia, the study was done with conscious sedation in the hospital.    MRI of the cervical spine 03/18/2020 showed T2 hyperintense foci at C1-C2, C2-C3, C4-C5, C5-C6 and T1.  There is a disc protrusion at C5-C6 to the left and a larger disc protrusion at C6-C7 to the right that distorts the thecal sac.  The adjacent spinal cord has normal signal.  There could be compression of the right C7 nerve root.     REVIEW OF SYSTEMS: Constitutional: No fevers, chills, sweats, or change in appetite Eyes: No visual changes, double vision, eye pain Ear, nose and throat: No hearing loss, ear pain, nasal congestion, sore throat Cardiovascular: No chest pain, palpitations Respiratory:  No shortness of breath at rest or with exertion.   No wheezes GastrointestinaI: No nausea, vomiting, diarrhea, abdominal pain, fecal incontinence Genitourinary:  No dysuria, urinary retention or frequency.  No nocturia. Musculoskeletal:  No neck pain, back pain Integumentary: No rash, pruritus, skin lesions Neurological: as above Psychiatric: No depression at this time.  No anxiety Endocrine: No palpitations, diaphoresis, change in appetite, change in weigh or increased thirst Hematologic/Lymphatic:  No anemia, purpura, petechiae. Allergic/Immunologic: No itchy/runny eyes, nasal congestion, recent allergic reactions, rashes  ALLERGIES: Allergies  Allergen Reactions   Amlodipine Swelling   Betadine [Povidone-Iodine]    Povidone Iodine Rash    HOME MEDICATIONS:  Current Outpatient Medications:    acetaminophen (TYLENOL) 500 MG tablet, Take 500-1,000 mg by mouth every 6  (six) hours as needed for moderate pain or headache., Disp: , Rfl:    baclofen (LIORESAL) 20 MG tablet, Take 1 tablet (20 mg total) by mouth 3 (three) times daily., Disp: 270 each, Rfl: 3   Cholecalciferol 25 MCG (1000 UT) tablet, Take 5,000 Units by mouth daily., Disp: , Rfl:    gabapentin (NEURONTIN) 100 MG capsule, Take 1 capsule (100 mg total) by mouth 2 (two) times daily., Disp: 180 capsule, Rfl: 3   oxybutynin (DITROPAN-XL) 10 MG 24 hr tablet, Take 1 tablet (10 mg total) by mouth daily., Disp: 90 tablet, Rfl: 3   Siponimod Fumarate (MAYZENT) 2 MG TABS, Take 2 mg by mouth daily., Disp: 30 tablet, Rfl: 5   telmisartan (MICARDIS) 40 MG tablet, Take 40 mg by mouth daily., Disp: , Rfl:    tiZANidine (ZANAFLEX) 4 MG tablet, One po qAM, one po qPM and 2 po qHS (Patient taking differently: Take 4 mg by mouth 2 (two) times daily. Take 4 mg in the morning and 4 mg at bedtime.), Disp: 120 tablet, Rfl: 5   dantrolene (DANTRIUM) 25 MG capsule, 1 po bid x 5 d, then 1 po tid x 5d, then 1 po qid x 5d then 2 po tid (Patient not taking: Reported on 11/15/2021), Disp: 180 capsule, Rfl: 5   famotidine (PEPCID) 20 MG tablet, Take 1 tablet (20 mg total) by mouth 2 (two) times daily., Disp: 60 tablet, Rfl: 0   polyethylene glycol (MIRALAX / GLYCOLAX) 17 g packet, Take 17 g by mouth daily., Disp: 14 each, Rfl: 0  PAST MEDICAL HISTORY: Past Medical History:  Diagnosis Date   Abdominal pain, right upper quadrant    Back pain    Calculus of kidney 12/09/2013   Chronic back pain    unspecified   Chronic left shoulder pain 07/19/2015   Complication of anesthesia    Functional disorder of bladder    other   Galactorrhea 11/26/2014   Chronic    Hereditary and idiopathic neuropathy 08/19/2013   HPV test positive    Hypercholesteremia 08/19/2013   Hypertension    Incomplete bladder emptying    Microscopic hematuria    MS (multiple sclerosis) (HCC)    Muscle spasticity 05/21/2014   Nonspecific findings on examination  of urine    other   Osteopenia    PONV (postoperative nausea and vomiting)    Status post laparoscopic supracervical hysterectomy 11/26/2014   Tobacco user 11/26/2014   Wrist fracture     PAST SURGICAL HISTORY: Past Surgical History:  Procedure Laterality Date   bilateral tubal ligation  1996   BREAST CYST EXCISION Left 2002   FRACTURE SURGERY     KNEE SURGERY     right   LAPAROSCOPIC SUPRACERVICAL HYSTERECTOMY  08/05/2013   ORIF WRIST FRACTURE Left 01/17/2017   Procedure: OPEN REDUCTION INTERNAL FIXATION (ORIF) WRIST FRACTURE;  Surgeon: Lyndle Herrlich, MD;  Location: [facility]  ORS;  Service: Orthopedics;  Laterality: Left;   RADIOLOGY WITH ANESTHESIA N/A 03/18/2020   Procedure: MRI WITH ANESTHESIA CERVICAL SPINE AND BRAIN  WITH AND WITHOUT CONTRAST;  Surgeon: Radiologist, Medication, MD;  Location: [facility] OR;  Service: Radiology;  Laterality: N/A;   TUBAL LIGATION Bilateral    VAGINAL HYSTERECTOMY  03/2006    FAMILY HISTORY: Family History  Problem Relation Age of Onset   Sickle cell trait Sister    Hypertension Sister    Supraventricular tachycardia Sister    Hypertension Sister    Diabetes Maternal Grandmother    Liver cancer Maternal Grandmother    Diabetes Maternal Aunt    Breast cancer Maternal Aunt        great MAT   Ovarian cancer Paternal Grandmother    Nephrolithiasis Father    Hypertension Father    Prostate cancer Father    Kidney disease Father    Heart failure Father    Hypertension Sister    Osteoarthritis Mother    Hypertension Mother    Hyperlipidemia Mother    GU problems Neg Hx    Urolithiasis Neg Hx     SOCIAL HISTORY:  Social History   Socioeconomic History   Marital status: Single    Spouse name: Not on file   Number of children: 0   Years of education: 14   Highest education level: Not on file  Occupational History   Not on file  Tobacco Use   Smoking status: Former    Packs/day: 0.50    Types: Cigarettes   Smokeless tobacco: Never   Vaping Use   Vaping Use: Never used  Substance and Sexual Activity   Alcohol use: No    Alcohol/week: 0.0 standard drinks of alcohol   Drug use: No   Sexual activity: Not Currently    Birth control/protection: Surgical  Other Topics Concern   Not on file  Social History Narrative   Lives at home with mom and sister,. Has a walker for ambulation/uses WC when necessary   Right Handed   Drinks no caffeine   Social Determinants of Corporate investment banker Strain: Not on file  Food Insecurity: Not on file  Transportation Needs: Not on file  Physical Activity: Not on file  Stress: Not on file  Social Connections: Not on file  Intimate Partner Violence: Not on file     PHYSICAL EXAM  Vitals:   02/01/22 1051  BP: (!) 162/80  Pulse: 62  Weight: 261 lb (118.4 kg)  Height: 5\' 11"  (1.803 m)    Body mass index is 36.4 kg/m.   General: The patient is well-developed and well-nourished and in no acute distress  HEENT:  Head is Hurricane/AT.  Sclera are anicteric.    Skin: Extremities are without rash or  edema.  Musculoskeletal:  Back is nontender  Neurologic Exam  Mental status: The patient is alert and oriented x 3 at the time of the examination. The patient has apparent normal recent and remote memory, with an apparently normal attention span and concentration ability.   Speech is normal.  Cranial nerves: Extraocular movements are full. Facial strength and sensation are normal.  No obvious hearing deficits are noted.  Motor:  Muscle bulk is normal.   Tone is increased in the legs, right slightly more than left but better than last visit.  Strength was 2+/5 in the hip flexors and 4/5 in the knee extensors, right slightly weaker than left.  Knee flexors were 4/5, the distal  legs are 3-4/5.  Strength was 5/5 in the arms though rapid altering movements were reduced on the right.   Sensory: Sensory testing showed mildly reduced touch and vibration sensation in the right compared  to the left..  Coordination: Cerebellar testing reveals good finger-nose-finger.  Heel-to-shin is difficult.  Gait and station: She is able to stand up using her arms to stabilize and walks lifting the hip by weight shift with her walker.  Reflexes: Deep tendon reflexes are symmetric and normal bilaterally.       DIAGNOSTIC DATA (LABS, IMAGING, TESTING) - I reviewed patient records, labs, notes, testing and imaging myself where available.  Lab Results  Component Value Date   WBC 9.4 11/20/2021   HGB 13.6 11/20/2021   HCT 38.8 11/20/2021   MCV 75.9 (L) 11/20/2021   PLT 281 11/20/2021      Component Value Date/Time   NA 142 11/20/2021 0528   NA 143 03/03/2021 1128   NA 139 02/15/2013 1700   K 4.4 11/20/2021 0528   K 3.8 02/15/2013 1700   CL 111 11/20/2021 0528   CL 109 (H) 02/15/2013 1700   CO2 27 11/20/2021 0528   CO2 25 02/15/2013 1700   GLUCOSE 133 (H) 11/20/2021 0528   GLUCOSE 94 02/15/2013 1700   BUN 37 (H) 11/20/2021 0528   BUN 21 03/03/2021 1128   BUN 20 (H) 02/15/2013 1700   CREATININE 0.85 11/20/2021 0528   CREATININE 0.59 (L) 02/15/2013 1700   CALCIUM 8.8 (L) 11/20/2021 0528   CALCIUM 9.0 02/15/2013 1700   PROT 6.8 11/14/2021 1758   PROT 6.5 03/03/2021 1128   PROT 7.2 02/15/2013 1700   ALBUMIN 4.0 11/14/2021 1758   ALBUMIN 4.5 03/03/2021 1128   ALBUMIN 3.6 02/15/2013 1700   AST 52 (H) 11/14/2021 1758   AST 20 02/15/2013 1700   ALT 88 (H) 11/14/2021 1758   ALT 15 02/15/2013 1700   ALKPHOS 146 (H) 11/14/2021 1758   ALKPHOS 127 02/15/2013 1700   BILITOT 1.0 11/14/2021 1758   BILITOT 0.7 03/03/2021 1128   BILITOT 0.3 02/15/2013 1700   GFRNONAA >60 11/20/2021 0528   GFRNONAA >60 02/15/2013 1700   GFRAA 110 05/13/2020 1146   GFRAA >60 02/15/2013 1700   Lab Results  Component Value Date   CHOL 187 11/29/2016   HDL 55 11/29/2016   LDLCALC 119 (H) 11/29/2016   TRIG 64 11/29/2016   CHOLHDL 3.4 11/29/2016   Lab Results  Component Value Date   HGBA1C  5.1 11/29/2016   No results found for: "VITAMINB12" Lab Results  Component Value Date   TSH 0.412 11/26/2020       ASSESSMENT AND PLAN  Multiple sclerosis exacerbation (HCC)  Gait disturbance  High risk medication use  Urinary urgency  Neurogenic bladder    She is on Mayzent therapy and tolerates it well.  We will check some lab work today. Continue baclofen and tizanidine dose.  Gabapentin for dysesthesias. Stay active and exercise as tolerated. 4.   Outpatient PT to help with recovery from recent hospitalization.   5.  Return in 6 months or sooner if there are new or worsening neurologic symptoms.     Jathniel Smeltzer A. Felecia Shelling, MD, [facility] 1/61/0960, 45:40 AM Certified in Neurology, Clinical Neurophysiology, Sleep Medicine and Neuroimaging  [facility] [address] [place], [place] [address] [phone]

## 2022-02-02 ENCOUNTER — Ambulatory Visit: Payer: PPO

## 2022-02-02 LAB — CBC WITH DIFFERENTIAL/PLATELET
Basophils Absolute: 0 10*3/uL (ref 0.0–0.2)
Basos: 1 %
EOS (ABSOLUTE): 0.1 10*3/uL (ref 0.0–0.4)
Eos: 2 %
Hematocrit: 40.7 % (ref 34.0–46.6)
Hemoglobin: 13.2 g/dL (ref 11.1–15.9)
Immature Grans (Abs): 0 10*3/uL (ref 0.0–0.1)
Immature Granulocytes: 0 %
Lymphocytes Absolute: 0.4 10*3/uL — ABNORMAL LOW (ref 0.7–3.1)
Lymphs: 6 %
MCH: 26.9 pg (ref 26.6–33.0)
MCHC: 32.4 g/dL (ref 31.5–35.7)
MCV: 83 fL (ref 79–97)
Monocytes Absolute: 0.6 10*3/uL (ref 0.1–0.9)
Monocytes: 10 %
Neutrophils Absolute: 4.8 10*3/uL (ref 1.4–7.0)
Neutrophils: 81 %
Platelets: 250 10*3/uL (ref 150–450)
RBC: 4.91 x10E6/uL (ref 3.77–5.28)
RDW: 14.8 % (ref 11.7–15.4)
WBC: 5.9 10*3/uL (ref 3.4–10.8)

## 2022-02-07 ENCOUNTER — Ambulatory Visit: Payer: PPO

## 2022-02-09 ENCOUNTER — Ambulatory Visit: Payer: PPO

## 2022-02-13 ENCOUNTER — Encounter: Payer: Self-pay | Admitting: Hematology and Oncology

## 2022-02-13 NOTE — Therapy (Signed)
OUTPATIENT PHYSICAL THERAPY NEURO EVALUATION   Patient Name: Lisa Williams MRN: [id number] DOB:1960-06-15, 61 y.o., female Today's Date: 02/13/2022   PCP: Kirk Ruths  REFERRING PROVIDER: Britt Bottom MD     Past Medical History:  Diagnosis Date   Abdominal pain, right upper quadrant    Back pain    Calculus of kidney 12/09/2013   Chronic back pain    unspecified   Chronic left shoulder pain 0000000   Complication of anesthesia    Functional disorder of bladder    other   Galactorrhea 11/26/2014   Chronic    Hereditary and idiopathic neuropathy 08/19/2013   HPV test positive    Hypercholesteremia 08/19/2013   Hypertension    Incomplete bladder emptying    Microscopic hematuria    MS (multiple sclerosis) (HCC)    Muscle spasticity 05/21/2014   Nonspecific findings on examination of urine    other   Osteopenia    PONV (postoperative nausea and vomiting)    Status post laparoscopic supracervical hysterectomy 11/26/2014   Tobacco user 11/26/2014   Wrist fracture    Past Surgical History:  Procedure Laterality Date   bilateral tubal ligation  1996   BREAST CYST EXCISION Left 2002   FRACTURE SURGERY     KNEE SURGERY     right   LAPAROSCOPIC SUPRACERVICAL HYSTERECTOMY  08/05/2013   ORIF WRIST FRACTURE Left 01/17/2017   Procedure: OPEN REDUCTION INTERNAL FIXATION (ORIF) WRIST FRACTURE;  Surgeon: Lovell Sheehan, MD;  Location: [facility];  Service: Orthopedics;  Laterality: Left;   RADIOLOGY WITH ANESTHESIA N/A 03/18/2020   Procedure: MRI WITH ANESTHESIA CERVICAL SPINE AND BRAIN  WITH AND WITHOUT CONTRAST;  Surgeon: Radiologist, Medication, MD;  Location: [facility];  Service: Radiology;  Laterality: N/A;   TUBAL LIGATION Bilateral    VAGINAL HYSTERECTOMY  03/2006   Patient Active Problem List   Diagnosis Date Noted   Abscess of left groin 11/15/2021   Abnormal LFTs 11/15/2021   Acute respiratory disease due to COVID-19 virus 11/21/2020   Weakness     Hypoalbuminemia due to protein-calorie malnutrition (HCC)    Neurogenic bowel    Neurogenic bladder    Labile blood pressure    Neuropathic pain    Abscess of female pelvis    SVT (supraventricular tachycardia)    Radial styloid tenosynovitis 03/12/2018   Wheelchair confinement 02/27/2018   Localized osteoporosis with current pathological fracture with routine healing 01/19/2017   Wrist fracture 01/16/2017   Sprain of ankle 03/23/2016   Closed fracture of lateral malleolus 03/16/2016   Health care maintenance 01/24/2016   Blood pressure elevated without history of HTN 10/25/2015   Essential hypertension 10/25/2015   Multiple sclerosis ([place]) 10/02/2015   Chronic left shoulder pain 07/19/2015   Multiple sclerosis exacerbation ([place]) 07/14/2015   MS (multiple sclerosis) ([place]) 11/26/2014   Increased body mass index 11/26/2014   HPV test positive 11/26/2014   Status post laparoscopic supracervical hysterectomy 11/26/2014   Galactorrhea 11/26/2014   Back ache 05/21/2014   Adiposity 05/21/2014   Disordered sleep 05/21/2014   Muscle spasticity 05/21/2014   Spasticity 05/21/2014   Calculus of kidney 99991111   Renal colic 99991111   Hypercholesteremia 08/19/2013   Hereditary and idiopathic neuropathy 08/19/2013   Hypercholesterolemia without hypertriglyceridemia 08/19/2013   Bladder infection, chronic 07/25/2012   Disorder of bladder function 07/25/2012   Incomplete bladder emptying 07/25/2012   Microscopic hematuria 07/25/2012   Right upper quadrant pain 07/25/2012    ONSET DATE: ***  REFERRING DIAG: ***  THERAPY DIAG:  No diagnosis found.  Rationale for Evaluation and Treatment {HABREHAB:27488}  SUBJECTIVE:                                                                                                                                                                                              SUBJECTIVE STATEMENT: *** Pt accompanied by: self  PERTINENT HISTORY:  Patient is retuning to PT after hospitalization on 11/14/21 where she then went to SNF and then received home health therapy. Patient has weakness in BLE with RLE>LLE. She drives with hand controls. Patient has been diagnosed with MS in 1995.  PMH includes: back pain, CBP, chronic L shoulder pain, galactorrhea, neuropathy, HPV, hypercholestermeia, HTN, MS, osteopenia, PONV, wrist fracture.   PAIN:  Are you having pain? {OPRCPAIN:27236}  PRECAUTIONS: Fall  WEIGHT BEARING RESTRICTIONS No  FALLS: Has patient fallen in last 6 months? {fallsyesno:27318}  LIVING ENVIRONMENT: Lives with: lives with their family Lives in: House/apartment Stairs: Yes: Internal: *** steps; but lives fully on first floor Has following equipment at home: Environmental consultant - 2 wheeled, Wheelchair (manual), shower chair, Grab bars, and Ramped entry  PLOF: Independent with household mobility with device  PATIENT GOALS ***  OBJECTIVE:   DIAGNOSTIC FINDINGS: MRI of the brain 03/18/2020 showed multiple T2/FLAIR hyperintense foci in the periventricular, juxtacortical and deep white matter.  There were no infratentorial lesions noted.  None of the foci enhanced.   Due to severe claustrophobia, the study was done with conscious sedation in the hospital  MRI of the cervical spine 03/18/2020 showed T2 hyperintense foci at C1-C2, C2-C3, C4-C5, C5-C6 and T1.  There is a disc protrusion at C5-C6 to the left and a larger disc protrusion at C6-C7 to the right that distorts the thecal sac.  The adjacent spinal cord has normal signal.  There could be compression of the right C7 nerve root.    COGNITION: Overall cognitive status: {cognition:24006}   SENSATION: {sensation:27233}  COORDINATION: ***  EDEMA:  {edema:24020}  MUSCLE TONE: {LE tone:25568}   MUSCLE LENGTH: Hamstrings: Right *** deg; Left *** deg Marcello Moores test: Right *** deg; Left *** deg  DTRs:  {DTR SITE:24025}  POSTURE: {posture:25561}  LOWER EXTREMITY ROM:      {AROM/PROM:27142}  Right Eval Left Eval  Hip flexion    Hip extension    Hip abduction    Hip adduction    Hip internal rotation    Hip external rotation    Knee flexion    Knee extension    Ankle dorsiflexion    Ankle plantarflexion    Ankle inversion  Ankle eversion     (Blank rows = not tested)  LOWER EXTREMITY MMT:    MMT Right Eval Left Eval  Hip flexion    Hip extension    Hip abduction    Hip adduction    Hip internal rotation    Hip external rotation    Knee flexion    Knee extension    Ankle dorsiflexion    Ankle plantarflexion    Ankle inversion    Ankle eversion    (Blank rows = not tested)  BED MOBILITY:  {Bed mobility:24027}  TRANSFERS: Assistive device utilized: {Assistive devices:23999}  Sit to stand: {Levels of assistance:24026} Stand to sit: {Levels of assistance:24026} Chair to chair: {Levels of assistance:24026} Floor: {Levels of assistance:24026}  RAMP:  Level of Assistance: {Levels of assistance:24026} Assistive device utilized: {Assistive devices:23999} Ramp Comments: ***  CURB:  Level of Assistance: {Levels of assistance:24026} Assistive device utilized: {Assistive devices:23999} Curb Comments: ***  STAIRS:  Level of Assistance: {Levels of assistance:24026}  Stair Negotiation Technique: {Stair Technique:27161} with {Rail Assistance:27162}  Number of Stairs: ***   Height of Stairs: ***  Comments: ***  GAIT: Gait pattern: {gait characteristics:25376} Distance walked: *** Assistive device utilized: {Assistive devices:23999} Level of assistance: {Levels of assistance:24026} Comments: ***  FUNCTIONAL TESTs:  {Functional tests:24029}  PATIENT SURVEYS:  {rehab surveys:24030}  TODAY'S TREATMENT:  ***   PATIENT EDUCATION: Education details: *** Person educated: {Person educated:25204} Education method: {Education Method:25205} Education comprehension: {Education Comprehension:25206}   HOME EXERCISE  PROGRAM: ***    GOALS: Goals reviewed with patient? {yes/no:20286}  SHORT TERM GOALS: Target date: {follow up:25551}  *** Baseline: Goal status: {GOALSTATUS:25110}  2.  *** Baseline:  Goal status: {GOALSTATUS:25110}  3.  *** Baseline:  Goal status: {GOALSTATUS:25110}  4.  *** Baseline:  Goal status: {GOALSTATUS:25110}  5.  *** Baseline:  Goal status: {GOALSTATUS:25110}  6.  *** Baseline:  Goal status: {GOALSTATUS:25110}  LONG TERM GOALS: Target date: {follow up:25551}  *** Baseline:  Goal status: {GOALSTATUS:25110}  2.  *** Baseline:  Goal status: {GOALSTATUS:25110}  3.  *** Baseline:  Goal status: {GOALSTATUS:25110}  4.  *** Baseline:  Goal status: {GOALSTATUS:25110}  5.  *** Baseline:  Goal status: {GOALSTATUS:25110}  6.  *** Baseline:  Goal status: {GOALSTATUS:25110}  ASSESSMENT:  CLINICAL IMPRESSION: Patient is a *** y.o. *** who was seen today for physical therapy evaluation and treatment for ***.    OBJECTIVE IMPAIRMENTS {opptimpairments:25111}.   ACTIVITY LIMITATIONS {activitylimitations:27494}  PARTICIPATION LIMITATIONS: {participationrestrictions:25113}  PERSONAL FACTORS {Personal factors:25162} are also affecting patient's functional outcome.   REHAB POTENTIAL: {rehabpotential:25112}  CLINICAL DECISION MAKING: {clinical decision making:25114}  EVALUATION COMPLEXITY: {Evaluation complexity:25115}  PLAN: PT FREQUENCY: {rehab frequency:25116}  PT DURATION: {rehab duration:25117}  PLANNED INTERVENTIONS: {rehab planned interventions:25118::"Therapeutic exercises","Therapeutic activity","Neuromuscular re-education","Balance training","Gait training","Patient/Family education","Self Care","Joint mobilization"}  PLAN FOR NEXT SESSION: ***   Janna Arch, PT 02/13/2022, 3:58 PM

## 2022-02-14 ENCOUNTER — Ambulatory Visit: Payer: PPO | Attending: Neurology

## 2022-02-14 DIAGNOSIS — R2681 Unsteadiness on feet: Secondary | ICD-10-CM | POA: Diagnosis not present

## 2022-02-14 DIAGNOSIS — R269 Unspecified abnormalities of gait and mobility: Secondary | ICD-10-CM | POA: Insufficient documentation

## 2022-02-14 DIAGNOSIS — R278 Other lack of coordination: Secondary | ICD-10-CM | POA: Diagnosis not present

## 2022-02-14 DIAGNOSIS — M6281 Muscle weakness (generalized): Secondary | ICD-10-CM | POA: Insufficient documentation

## 2022-02-14 DIAGNOSIS — G35 Multiple sclerosis: Secondary | ICD-10-CM | POA: Insufficient documentation

## 2022-02-14 DIAGNOSIS — R262 Difficulty in walking, not elsewhere classified: Secondary | ICD-10-CM | POA: Diagnosis not present

## 2022-02-14 DIAGNOSIS — R2689 Other abnormalities of gait and mobility: Secondary | ICD-10-CM | POA: Diagnosis not present

## 2022-02-15 NOTE — Therapy (Signed)
OUTPATIENT PHYSICAL THERAPY NEURO TREATMENT   Patient Name: Lisa Williams MRN: [id number] DOB:1960-07-27, 61 y.o., female Today's Date: 02/16/2022   PCP: Lauro Regulus  REFERRING PROVIDER: Asa Lente MD    PT End of Session - 02/16/22 1008     Visit Number 2    Number of Visits 24    Date for PT Re-Evaluation 05/09/22    Authorization Type 2/10: eval 10/10    PT Start Time 1015    PT Stop Time 1059    PT Time Calculation (min) 44 min    Equipment Utilized During Treatment Gait belt    Activity Tolerance Patient tolerated treatment well    Behavior During Therapy WFL for tasks assessed/performed              Past Medical History:  Diagnosis Date   Abdominal pain, right upper quadrant    Back pain    Calculus of kidney 12/09/2013   Chronic back pain    unspecified   Chronic left shoulder pain 07/19/2015   Complication of anesthesia    Functional disorder of bladder    other   Galactorrhea 11/26/2014   Chronic    Hereditary and idiopathic neuropathy 08/19/2013   HPV test positive    Hypercholesteremia 08/19/2013   Hypertension    Incomplete bladder emptying    Microscopic hematuria    MS (multiple sclerosis) (HCC)    Muscle spasticity 05/21/2014   Nonspecific findings on examination of urine    other   Osteopenia    PONV (postoperative nausea and vomiting)    Status post laparoscopic supracervical hysterectomy 11/26/2014   Tobacco user 11/26/2014   Wrist fracture    Past Surgical History:  Procedure Laterality Date   bilateral tubal ligation  1996   BREAST CYST EXCISION Left 2002   FRACTURE SURGERY     KNEE SURGERY     right   LAPAROSCOPIC SUPRACERVICAL HYSTERECTOMY  08/05/2013   ORIF WRIST FRACTURE Left 01/17/2017   Procedure: OPEN REDUCTION INTERNAL FIXATION (ORIF) WRIST FRACTURE;  Surgeon: Lyndle Herrlich, MD;  Location: [facility];  Service: Orthopedics;  Laterality: Left;   RADIOLOGY WITH ANESTHESIA N/A 03/18/2020   Procedure: MRI  WITH ANESTHESIA CERVICAL SPINE AND BRAIN  WITH AND WITHOUT CONTRAST;  Surgeon: Radiologist, Medication, MD;  Location: [facility];  Service: Radiology;  Laterality: N/A;   TUBAL LIGATION Bilateral    VAGINAL HYSTERECTOMY  03/2006   Patient Active Problem List   Diagnosis Date Noted   Abscess of left groin 11/15/2021   Abnormal LFTs 11/15/2021   Acute respiratory disease due to COVID-19 virus 11/21/2020   Weakness    Hypoalbuminemia due to protein-calorie malnutrition [facility])    Neurogenic bowel    Neurogenic bladder    Labile blood pressure    Neuropathic pain    Abscess of female pelvis    SVT (supraventricular tachycardia)    Radial styloid tenosynovitis 03/12/2018   Wheelchair confinement 02/27/2018   Localized osteoporosis with current pathological fracture with routine healing 01/19/2017   Wrist fracture 01/16/2017   Sprain of ankle 03/23/2016   Closed fracture of lateral malleolus 03/16/2016   Health care maintenance 01/24/2016   Blood pressure elevated without history of HTN 10/25/2015   Essential hypertension 10/25/2015   Multiple sclerosis (HCC) 10/02/2015   Chronic left shoulder pain 07/19/2015   Multiple sclerosis exacerbation (HCC) 07/14/2015   MS (multiple sclerosis) (HCC) 11/26/2014   Increased body mass index 11/26/2014   HPV test positive 11/26/2014  Status post laparoscopic supracervical hysterectomy 11/26/2014   Galactorrhea 11/26/2014   Back ache 05/21/2014   Adiposity 05/21/2014   Disordered sleep 05/21/2014   Muscle spasticity 05/21/2014   Spasticity 05/21/2014   Calculus of kidney 12/09/2013   Renal colic 12/09/2013   Hypercholesteremia 08/19/2013   Hereditary and idiopathic neuropathy 08/19/2013   Hypercholesterolemia without hypertriglyceridemia 08/19/2013   Bladder infection, chronic 07/25/2012   Disorder of bladder function 07/25/2012   Incomplete bladder emptying 07/25/2012   Microscopic hematuria 07/25/2012   Right upper quadrant pain 07/25/2012     ONSET DATE: 1995  REFERRING DIAG: MS  THERAPY DIAG:  Difficulty in walking, not elsewhere classified  Unsteadiness on feet  Muscle weakness (generalized)  Abnormality of gait and mobility  Rationale for Evaluation and Treatment Rehabilitation  SUBJECTIVE:                                                                                                                                                                                              SUBJECTIVE STATEMENT: Patient is continuing to have cramps that are affecting her back and side.  Pt accompanied by: self  PERTINENT HISTORY: Patient is retuning to PT after hospitalization on 11/14/21 where she then went to SNF and then received home health therapy which stopped 02/08/22. Patient has weakness in BLE with RLE>LLE. She drives with hand controls. Patient has been diagnosed with MS in 1995.  PMH includes: back pain, CBP, chronic L shoulder pain, galactorrhea, neuropathy, HPV, hypercholestermeia, HTN, MS, osteopenia, PONV, wrist fracture. Started back driving about two weeks ago.   PAIN:  Are you having pain? Yes: NPRS scale: 2/10 Pain location: abdomen to R leg Pain description: spasm Aggravating factors: unknown Relieving factors: n/a  PRECAUTIONS: Fall  WEIGHT BEARING RESTRICTIONS No  FALLS: Has patient fallen in last 6 months? No  LIVING ENVIRONMENT: Lives with: lives with their family Lives in: House/apartment Stairs: Yes: Internal: full flight steps; but lives fully on first floor Has following equipment at home: Dan Humphreys - 2 wheeled, Wheelchair (manual), shower chair, Grab bars, and Ramped entry  PLOF: Independent with household mobility with device  PATIENT GOALS be able to get into the bed without assistance, improve ambulation, standing tolerance, going up steps.   OBJECTIVE:   DIAGNOSTIC FINDINGS: MRI of the brain 03/18/2020 showed multiple T2/FLAIR hyperintense foci in the periventricular, juxtacortical  and deep white matter.  There were no infratentorial lesions noted.  None of the foci enhanced.   Due to severe claustrophobia, the study was done with conscious sedation in the hospital  MRI of the cervical spine 03/18/2020 showed T2 hyperintense foci at  C1-C2, C2-C3, C4-C5, C5-C6 and T1.  There is a disc protrusion at C5-C6 to the left and a larger disc protrusion at C6-C7 to the right that distorts the thecal sac.  The adjacent spinal cord has normal signal.  There could be compression of the right C7 nerve root.       LOWER EXTREMITY MMT:    MMT Right Eval Left Eval  Hip flexion 2- 2  Hip extension 2- 2  Hip abduction 2+ 2+  Hip adduction 2+ 2+  Knee flexion 2- 2  Knee extension 2 2  Ankle dorsiflexion trace trace  Ankle plantarflexion trace trace  (Blank rows = not tested)    TODAY'S TREATMENT:  In // bars: -standing weight shift 10x each side, focus on LE recruitment for shift of weight rather than pulling with arms -forward/backward step 5x each LE; heavy UE support -attempted modified squat: unable to perform; regressed to single knee bend 5x each side.  -tandem stance 30 seconds each LE   Seated: STM to R paraspinals and quadratus lumborum for spasm reduction with implementation of effleurage and ptrissage x 8 minutes Isometric contraction into PT hand: cueing for muscle contraction 30-40% 10x each activation: flexion, abduction, adduction. LAQ AAROM 10x each LE Hand clap for coordination and UE strength x 2 minutes Modified windmill 10x each side Lift leg with Ue's 10x each LE for carryover to bed Lift leg and lift over band 10x each LE    PATIENT EDUCATION: Education details: goals, POC, outcome measures Person educated: Patient Education method: Explanation, Demonstration, Tactile cues, and Verbal cues Education comprehension: verbalized understanding, returned demonstration, verbal cues required, and tactile cues required   HOME EXERCISE PROGRAM: Give  next session    GOALS: Goals reviewed with patient? Yes  SHORT TERM GOALS: Target date: 03/28/2022  Patient will be independent in home exercise program to improve strength/mobility for better functional independence with ADLs. Baseline: Goal status: INITIAL    LONG TERM GOALS: Target date: 05/09/2022  Patient will increase FOTO score to equal to or greater than  52%   to demonstrate statistically significant improvement in mobility and quality of life Baseline: 10/10: 42% Goal status: INITIAL  2.  Patient (> 29 years old) will complete five times sit to stand test in < 15 seconds indicating an increased LE strength and improved balance. Baseline: 10/10: 38 seconds with one plop heavy UE support Goal status: INITIAL  3.  Patient will increase 10 meter walk test to >0.5 m/s as to improve gait speed for better community ambulation and to reduce fall risk. Baseline: 10/10: 54 seconds with bRW andw/c follow Goal status: INITIAL  4.  Patient will be able to get into bed independently with use of railing to increase independence with mobility and nighttime routine.  Baseline: 10/10: needs complete assistance to bring RLE into bed.  Goal status: INITIAL  5.  Patient will go up 2 small steps to enter sisters house with min A.  Baseline: 10/10: unable to perform  Goal status: INITIAL   ASSESSMENT:  CLINICAL IMPRESSION: Patient's spasms are reduced allowing for continuation of therapy session. Patient presents with excellent motivation throughout session.  She demonstrates improved balance. Lifting leg for carryover to bed mobility introduced with patient tolerating well. Patient will benefit from skilled physical therapy to increase strength, mobility, and stability for decreased fall risk and improved quality of life.    OBJECTIVE IMPAIRMENTS Abnormal gait, decreased activity tolerance, decreased balance, decreased coordination, decreased endurance, decreased mobility, difficulty  walking, decreased ROM, decreased strength, hypomobility, increased edema, impaired perceived functional ability, increased muscle spasms, impaired flexibility, impaired tone, impaired UE functional use, improper body mechanics, postural dysfunction, obesity, and pain.   ACTIVITY LIMITATIONS carrying, lifting, bending, standing, squatting, sleeping, stairs, transfers, bed mobility, bathing, toileting, dressing, reach over head, locomotion level, and caring for others  PARTICIPATION LIMITATIONS: meal prep, cleaning, laundry, shopping, community activity, and yard work  PERSONAL FACTORS Age, Fitness, Past/current experiences, Sex, Social background, Time since onset of injury/illness/exacerbation, Transportation, and 3+ comorbidities: back pain, CBP, chronic L shoulder pain, galactorrhea, neuropathy, HPV, hypercholestermeia, HTN, MS, osteopenia, PONV, wrist fracture  are also affecting patient's functional outcome.   REHAB POTENTIAL: Fair    CLINICAL DECISION MAKING: Evolving/moderate complexity  EVALUATION COMPLEXITY: Moderate  PLAN: PT FREQUENCY: 2x/week  PT DURATION: 12 weeks  PLANNED INTERVENTIONS: Therapeutic exercises, Therapeutic activity, Neuromuscular re-education, Balance training, Gait training, Patient/Family education, Self Care, Joint mobilization, Stair training, Vestibular training, Canalith repositioning, Visual/preceptual remediation/compensation, Orthotic/Fit training, DME instructions, Dry Needling, Electrical stimulation, Wheelchair mobility training, Spinal mobilization, Cryotherapy, Moist heat, Taping, Vasopneumatic device, Traction, Ultrasound, Ionotophoresis 4mg /ml Dexamethasone, Manual therapy, and Re-evaluation  PLAN FOR NEXT SESSION: standing balance and strengthening    Janna Arch, PT 02/16/2022, 11:50 AM

## 2022-02-16 ENCOUNTER — Ambulatory Visit: Payer: PPO

## 2022-02-16 DIAGNOSIS — R262 Difficulty in walking, not elsewhere classified: Secondary | ICD-10-CM | POA: Diagnosis not present

## 2022-02-16 DIAGNOSIS — M6281 Muscle weakness (generalized): Secondary | ICD-10-CM

## 2022-02-16 DIAGNOSIS — R2681 Unsteadiness on feet: Secondary | ICD-10-CM

## 2022-02-16 DIAGNOSIS — R269 Unspecified abnormalities of gait and mobility: Secondary | ICD-10-CM

## 2022-02-20 ENCOUNTER — Ambulatory Visit (LOCAL_COMMUNITY_HEALTH_CENTER): Payer: PPO

## 2022-02-20 DIAGNOSIS — Z719 Counseling, unspecified: Secondary | ICD-10-CM

## 2022-02-20 DIAGNOSIS — Z23 Encounter for immunization: Secondary | ICD-10-CM | POA: Diagnosis not present

## 2022-02-20 NOTE — Progress Notes (Signed)
  Are you feeling sick today? No   Have you ever received a dose of COVID-19 Vaccine? [employer], [place], [place], [place], Other) Yes  If yes, which vaccine and how many doses?   [place]   Did you bring the vaccination record card or other documentation?  Yes   Do you have a health condition or are undergoing treatment that makes you moderately or severely immunocompromised? This would include, but not be limited to: cancer, HIV, organ transplant, immunosuppressive therapy/high-dose corticosteroids, or moderate/severe primary immunodeficiency.  No  Have you received COVID-19 vaccine before or during hematopoietic cell transplant (HCT) or CAR-T-cell therapies? No  Have you ever had an allergic reaction to: (This would include a severe allergic reaction or a reaction that caused hives, swelling, or respiratory distress, including wheezing.) A component of a COVID-19 vaccine or a previous dose of COVID-19 vaccine? No   Have you ever had an allergic reaction to another vaccine (other thanCOVID-19 vaccine) or an injectable medication? (This would include a severe allergic reaction or a reaction that caused hives, swelling, or respiratory distress, including wheezing.)   No    Do you have a history of any of the following:  Myocarditis or Pericarditis No  Dermal fillers:  No  Multisystem Inflammatory Syndrome (MIS-C or MIS-A)? No  COVID-19 disease within the past 3 months? No  Vaccinated with monkeypox vaccine in the last 4 weeks? No  VIS provided.  Comirnaty +12Y IM right deltoid.  Tolerated well. COVID card updated and [facility] updated and copy provided to patient.

## 2022-02-21 ENCOUNTER — Ambulatory Visit: Payer: PPO

## 2022-02-21 DIAGNOSIS — R278 Other lack of coordination: Secondary | ICD-10-CM

## 2022-02-21 DIAGNOSIS — R269 Unspecified abnormalities of gait and mobility: Secondary | ICD-10-CM

## 2022-02-21 DIAGNOSIS — M6281 Muscle weakness (generalized): Secondary | ICD-10-CM

## 2022-02-21 DIAGNOSIS — R262 Difficulty in walking, not elsewhere classified: Secondary | ICD-10-CM | POA: Diagnosis not present

## 2022-02-21 DIAGNOSIS — G35 Multiple sclerosis: Secondary | ICD-10-CM

## 2022-02-21 DIAGNOSIS — R2689 Other abnormalities of gait and mobility: Secondary | ICD-10-CM

## 2022-02-21 DIAGNOSIS — R2681 Unsteadiness on feet: Secondary | ICD-10-CM

## 2022-02-21 NOTE — Therapy (Signed)
OUTPATIENT PHYSICAL THERAPY NEURO TREATMENT   Patient Name: Lisa Williams MRN: [id number] DOB:[birth year], 61 y.o., female Today's Date: 02/21/2022   PCP: Kirk Ruths  REFERRING PROVIDER: Britt Bottom MD    PT End of Session - 02/21/22 1021     Visit Number 3    Number of Visits 24    Date for PT Re-Evaluation 05/09/22    Authorization Type 2/10: eval 10/10    PT Start Time 1017    PT Stop Time 1055    PT Time Calculation (min) 38 min    Equipment Utilized During Treatment Gait belt    Activity Tolerance Patient tolerated treatment well    Behavior During Therapy WFL for tasks assessed/performed               Past Medical History:  Diagnosis Date   Abdominal pain, right upper quadrant    Back pain    Calculus of kidney 12/09/2013   Chronic back pain    unspecified   Chronic left shoulder pain 9/56/2130   Complication of anesthesia    Functional disorder of bladder    other   Galactorrhea 11/26/2014   Chronic    Hereditary and idiopathic neuropathy 08/19/2013   HPV test positive    Hypercholesteremia 08/19/2013   Hypertension    Incomplete bladder emptying    Microscopic hematuria    MS (multiple sclerosis) (HCC)    Muscle spasticity 05/21/2014   Nonspecific findings on examination of urine    other   Osteopenia    PONV (postoperative nausea and vomiting)    Status post laparoscopic supracervical hysterectomy 11/26/2014   Tobacco user 11/26/2014   Wrist fracture    Past Surgical History:  Procedure Laterality Date   bilateral tubal ligation  1996   BREAST CYST EXCISION Left 2002   FRACTURE SURGERY     KNEE SURGERY     right   LAPAROSCOPIC SUPRACERVICAL HYSTERECTOMY  08/05/2013   ORIF WRIST FRACTURE Left 01/17/2017   Procedure: OPEN REDUCTION INTERNAL FIXATION (ORIF) WRIST FRACTURE;  Surgeon: Lovell Sheehan, MD;  Location: [facility];  Service: Orthopedics;  Laterality: Left;   RADIOLOGY WITH ANESTHESIA N/A 03/18/2020   Procedure: MRI  WITH ANESTHESIA CERVICAL SPINE AND BRAIN  WITH AND WITHOUT CONTRAST;  Surgeon: Radiologist, Medication, MD;  Location: [facility];  Service: Radiology;  Laterality: N/A;   TUBAL LIGATION Bilateral    VAGINAL HYSTERECTOMY  03/2006   Patient Active Problem List   Diagnosis Date Noted   Abscess of left groin 11/15/2021   Abnormal LFTs 11/15/2021   Acute respiratory disease due to COVID-19 virus 11/21/2020   Weakness    Hypoalbuminemia due to protein-calorie malnutrition [facility])    Neurogenic bowel    Neurogenic bladder    Labile blood pressure    Neuropathic pain    Abscess of female pelvis    SVT (supraventricular tachycardia)    Radial styloid tenosynovitis 03/12/2018   Wheelchair confinement 02/27/2018   Localized osteoporosis with current pathological fracture with routine healing 01/19/2017   Wrist fracture 01/16/2017   Sprain of ankle 03/23/2016   Closed fracture of lateral malleolus 03/16/2016   Health care maintenance 01/24/2016   Blood pressure elevated without history of HTN 10/25/2015   Essential hypertension 10/25/2015   Multiple sclerosis ([place]) 10/02/2015   Chronic left shoulder pain 07/19/2015   Multiple sclerosis exacerbation ([place]) 07/14/2015   MS (multiple sclerosis) ([place]) 11/26/2014   Increased body mass index 11/26/2014   HPV test positive  11/26/2014   Status post laparoscopic supracervical hysterectomy 11/26/2014   Galactorrhea 11/26/2014   Back ache 05/21/2014   Adiposity 05/21/2014   Disordered sleep 05/21/2014   Muscle spasticity 05/21/2014   Spasticity 05/21/2014   Calculus of kidney 12/09/2013   Renal colic 12/09/2013   Hypercholesteremia 08/19/2013   Hereditary and idiopathic neuropathy 08/19/2013   Hypercholesterolemia without hypertriglyceridemia 08/19/2013   Bladder infection, chronic 07/25/2012   Disorder of bladder function 07/25/2012   Incomplete bladder emptying 07/25/2012   Microscopic hematuria 07/25/2012   Right upper quadrant pain 07/25/2012     ONSET DATE: 1995  REFERRING DIAG: MS  THERAPY DIAG:  Difficulty in walking, not elsewhere classified  Unsteadiness on feet  Muscle weakness (generalized)  Abnormality of gait and mobility  Other abnormalities of gait and mobility  Other lack of coordination  Multiple sclerosis exacerbation (HCC)  Rationale for Evaluation and Treatment Rehabilitation  SUBJECTIVE:                                                                                                                                                                                              SUBJECTIVE STATEMENT: Patient feeling better this week- Agreeable to walking during visit.  Pt accompanied by: self  PERTINENT HISTORY: Patient is retuning to PT after hospitalization on 11/14/21 where she then went to SNF and then received home health therapy which stopped 02/08/22. Patient has weakness in BLE with RLE>LLE. She drives with hand controls. Patient has been diagnosed with MS in 1995.  PMH includes: back pain, CBP, chronic L shoulder pain, galactorrhea, neuropathy, HPV, hypercholestermeia, HTN, MS, osteopenia, PONV, wrist fracture. Started back driving about two weeks ago.   PAIN:  Are you having pain? Yes: NPRS scale: 2/10 Pain location: abdomen to R leg Pain description: spasm Aggravating factors: unknown Relieving factors: n/a  PRECAUTIONS: Fall  WEIGHT BEARING RESTRICTIONS No  FALLS: Has patient fallen in last 6 months? No  LIVING ENVIRONMENT: Lives with: lives with their family Lives in: House/apartment Stairs: Yes: Internal: full flight steps; but lives fully on first floor Has following equipment at home: Dan Humphreys - 2 wheeled, Wheelchair (manual), shower chair, Grab bars, and Ramped entry  PLOF: Independent with household mobility with device  PATIENT GOALS be able to get into the bed without assistance, improve ambulation, standing tolerance, going up steps.   OBJECTIVE:   DIAGNOSTIC FINDINGS:  MRI of the brain 03/18/2020 showed multiple T2/FLAIR hyperintense foci in the periventricular, juxtacortical and deep white matter.  There were no infratentorial lesions noted.  None of the foci enhanced.   Due to severe claustrophobia, the study was done with  conscious sedation in the hospital  MRI of the cervical spine 03/18/2020 showed T2 hyperintense foci at C1-C2, C2-C3, C4-C5, C5-C6 and T1.  There is a disc protrusion at C5-C6 to the left and a larger disc protrusion at C6-C7 to the right that distorts the thecal sac.  The adjacent spinal cord has normal signal.  There could be compression of the right C7 nerve root.       LOWER EXTREMITY MMT:    MMT Right Eval Left Eval  Hip flexion 2- 2  Hip extension 2- 2  Hip abduction 2+ 2+  Hip adduction 2+ 2+  Knee flexion 2- 2  Knee extension 2 2  Ankle dorsiflexion trace trace  Ankle plantarflexion trace trace  (Blank rows = not tested)    TODAY'S TREATMENT:  In // bars: -standing weight shift 10x each side x 2 sets,  -Static stand without UE support up to 15 sec x 3 trials- focusing on standing erect and looking ahead with core engaged, glutes and quads activated -Lateral weight shift (with alt UE raise off support) x 20 reps -forward/backward step 10x each LE; heavy UE support (more difficulty with left LE advancement)   Seated: I-sometric contraction into PT hand: cueing for muscle contraction- 10x each: abduction and adduction. -LAQ AAROM (assist to raise leg to allow leg to swing forward) 10x each LE  -Ambulation: Patient ambulated 43 feet with bariatric RW, CGA with close w/c follow- exhibiting good hip swing- short reciprocal steps, decreased step length/knee flexion yet good ability to stand erect and keep LE advancing. Limited only by fatigue.     PATIENT EDUCATION: Education details: goals, POC, outcome measures Person educated: Patient Education method: Explanation, Demonstration, Tactile cues, and Verbal  cues Education comprehension: verbalized understanding, returned demonstration, verbal cues required, and tactile cues required   HOME EXERCISE PROGRAM: Give next session    GOALS: Goals reviewed with patient? Yes  SHORT TERM GOALS: Target date: 03/28/2022  Patient will be independent in home exercise program to improve strength/mobility for better functional independence with ADLs. Baseline: Goal status: INITIAL    LONG TERM GOALS: Target date: 05/09/2022  Patient will increase FOTO score to equal to or greater than  52%   to demonstrate statistically significant improvement in mobility and quality of life Baseline: 10/10: 42% Goal status: INITIAL  2.  Patient (> 75 years old) will complete five times sit to stand test in < 15 seconds indicating an increased LE strength and improved balance. Baseline: 10/10: 38 seconds with one plop heavy UE support Goal status: INITIAL  3.  Patient will increase 10 meter walk test to >0.5 m/s as to improve gait speed for better community ambulation and to reduce fall risk. Baseline: 10/10: 54 seconds with bRW andw/c follow Goal status: INITIAL  4.  Patient will be able to get into bed independently with use of railing to increase independence with mobility and nighttime routine.  Baseline: 10/10: needs complete assistance to bring RLE into bed.  Goal status: INITIAL  5.  Patient will go up 2 small steps to enter sisters house with min A.  Baseline: 10/10: unable to perform  Goal status: INITIAL   ASSESSMENT:  CLINICAL IMPRESSION: Patient performed well today and maintained excellent motivation during session. She was able to stand today well with weight shifting and weight bearing activities- no evidence of knee buckling. She was later able to ambulate with use of walker - improving her overall distance- still well short of her distance  from previous episode of care but trending in the right direction. Patient will benefit from skilled  physical therapy to increase strength, mobility, and stability for decreased fall risk and improved quality of life.    OBJECTIVE IMPAIRMENTS Abnormal gait, decreased activity tolerance, decreased balance, decreased coordination, decreased endurance, decreased mobility, difficulty walking, decreased ROM, decreased strength, hypomobility, increased edema, impaired perceived functional ability, increased muscle spasms, impaired flexibility, impaired tone, impaired UE functional use, improper body mechanics, postural dysfunction, obesity, and pain.   ACTIVITY LIMITATIONS carrying, lifting, bending, standing, squatting, sleeping, stairs, transfers, bed mobility, bathing, toileting, dressing, reach over head, locomotion level, and caring for others  PARTICIPATION LIMITATIONS: meal prep, cleaning, laundry, shopping, community activity, and yard work  PERSONAL FACTORS Age, Fitness, Past/current experiences, Sex, Social background, Time since onset of injury/illness/exacerbation, Transportation, and 3+ comorbidities: back pain, CBP, chronic L shoulder pain, galactorrhea, neuropathy, HPV, hypercholestermeia, HTN, MS, osteopenia, PONV, wrist fracture  are also affecting patient's functional outcome.   REHAB POTENTIAL: Fair    CLINICAL DECISION MAKING: Evolving/moderate complexity  EVALUATION COMPLEXITY: Moderate  PLAN: PT FREQUENCY: 2x/week  PT DURATION: 12 weeks  PLANNED INTERVENTIONS: Therapeutic exercises, Therapeutic activity, Neuromuscular re-education, Balance training, Gait training, Patient/Family education, Self Care, Joint mobilization, Stair training, Vestibular training, Canalith repositioning, Visual/preceptual remediation/compensation, Orthotic/Fit training, DME instructions, Dry Needling, Electrical stimulation, Wheelchair mobility training, Spinal mobilization, Cryotherapy, Moist heat, Taping, Vasopneumatic device, Traction, Ultrasound, Ionotophoresis 4mg /ml Dexamethasone, Manual therapy,  and Re-evaluation  PLAN FOR NEXT SESSION: standing balance and strengthening    , PT 02/21/2022, 3:14 PM

## 2022-02-22 NOTE — Therapy (Signed)
OUTPATIENT PHYSICAL THERAPY NEURO TREATMENT   Patient Name: Lisa Williams MRN: [id number] DOB:[birth year], 61 y.o., female Today's Date: 02/23/2022   PCP: Lauro Regulus  REFERRING PROVIDER: Asa Lente MD    PT End of Session - 02/23/22 0955     Visit Number 4    Number of Visits 24    Date for PT Re-Evaluation 05/09/22    Authorization Type 4/10: eval 10/10    PT Start Time 1015    PT Stop Time 1058    PT Time Calculation (min) 43 min    Equipment Utilized During Treatment Gait belt    Activity Tolerance Patient tolerated treatment well    Behavior During Therapy WFL for tasks assessed/performed                Past Medical History:  Diagnosis Date   Abdominal pain, right upper quadrant    Back pain    Calculus of kidney 12/09/2013   Chronic back pain    unspecified   Chronic left shoulder pain 07/19/2015   Complication of anesthesia    Functional disorder of bladder    other   Galactorrhea 11/26/2014   Chronic    Hereditary and idiopathic neuropathy 08/19/2013   HPV test positive    Hypercholesteremia 08/19/2013   Hypertension    Incomplete bladder emptying    Microscopic hematuria    MS (multiple sclerosis) (HCC)    Muscle spasticity 05/21/2014   Nonspecific findings on examination of urine    other   Osteopenia    PONV (postoperative nausea and vomiting)    Status post laparoscopic supracervical hysterectomy 11/26/2014   Tobacco user 11/26/2014   Wrist fracture    Past Surgical History:  Procedure Laterality Date   bilateral tubal ligation  1996   BREAST CYST EXCISION Left 2002   FRACTURE SURGERY     KNEE SURGERY     right   LAPAROSCOPIC SUPRACERVICAL HYSTERECTOMY  08/05/2013   ORIF WRIST FRACTURE Left 01/17/2017   Procedure: OPEN REDUCTION INTERNAL FIXATION (ORIF) WRIST FRACTURE;  Surgeon: Lyndle Herrlich, MD;  Location: [facility];  Service: Orthopedics;  Laterality: Left;   RADIOLOGY WITH ANESTHESIA N/A 03/18/2020   Procedure:  MRI WITH ANESTHESIA CERVICAL SPINE AND BRAIN  WITH AND WITHOUT CONTRAST;  Surgeon: Radiologist, Medication, MD;  Location: [facility];  Service: Radiology;  Laterality: N/A;   TUBAL LIGATION Bilateral    VAGINAL HYSTERECTOMY  03/2006   Patient Active Problem List   Diagnosis Date Noted   Abscess of left groin 11/15/2021   Abnormal LFTs 11/15/2021   Acute respiratory disease due to COVID-19 virus 11/21/2020   Weakness    Hypoalbuminemia due to protein-calorie malnutrition (HCC)    Neurogenic bowel    Neurogenic bladder    Labile blood pressure    Neuropathic pain    Abscess of female pelvis    SVT (supraventricular tachycardia)    Radial styloid tenosynovitis 03/12/2018   Wheelchair confinement 02/27/2018   Localized osteoporosis with current pathological fracture with routine healing 01/19/2017   Wrist fracture 01/16/2017   Sprain of ankle 03/23/2016   Closed fracture of lateral malleolus 03/16/2016   Health care maintenance 01/24/2016   Blood pressure elevated without history of HTN 10/25/2015   Essential hypertension 10/25/2015   Multiple sclerosis (HCC) 10/02/2015   Chronic left shoulder pain 07/19/2015   Multiple sclerosis exacerbation (HCC) 07/14/2015   MS (multiple sclerosis) (HCC) 11/26/2014   Increased body mass index 11/26/2014   HPV test  positive 11/26/2014   Status post laparoscopic supracervical hysterectomy 11/26/2014   Galactorrhea 11/26/2014   Back ache 05/21/2014   Adiposity 05/21/2014   Disordered sleep 05/21/2014   Muscle spasticity 05/21/2014   Spasticity 05/21/2014   Calculus of kidney 12/09/2013   Renal colic 12/09/2013   Hypercholesteremia 08/19/2013   Hereditary and idiopathic neuropathy 08/19/2013   Hypercholesterolemia without hypertriglyceridemia 08/19/2013   Bladder infection, chronic 07/25/2012   Disorder of bladder function 07/25/2012   Incomplete bladder emptying 07/25/2012   Microscopic hematuria 07/25/2012   Right upper quadrant pain  07/25/2012    ONSET DATE: 1995  REFERRING DIAG: MS  THERAPY DIAG:  Difficulty in walking, not elsewhere classified  Unsteadiness on feet  Muscle weakness (generalized)  Abnormality of gait and mobility  Rationale for Evaluation and Treatment Rehabilitation  SUBJECTIVE:                                                                                                                                                                                              SUBJECTIVE STATEMENT: Patient reports her legs have felt weak since getting her covid booster.   Pt accompanied by: self  PERTINENT HISTORY: Patient is retuning to PT after hospitalization on 11/14/21 where she then went to SNF and then received home health therapy which stopped 02/08/22. Patient has weakness in BLE with RLE>LLE. She drives with hand controls. Patient has been diagnosed with MS in 1995.  PMH includes: back pain, CBP, chronic L shoulder pain, galactorrhea, neuropathy, HPV, hypercholestermeia, HTN, MS, osteopenia, PONV, wrist fracture. Started back driving about two weeks ago.   PAIN:  Are you having pain? Yes: NPRS scale: 2/10 Pain location: abdomen to R leg Pain description: spasm Aggravating factors: unknown Relieving factors: n/a  PRECAUTIONS: Fall  WEIGHT BEARING RESTRICTIONS No  FALLS: Has patient fallen in last 6 months? No  LIVING ENVIRONMENT: Lives with: lives with their family Lives in: House/apartment Stairs: Yes: Internal: full flight steps; but lives fully on first floor Has following equipment at home: Dan Humphreys - 2 wheeled, Wheelchair (manual), shower chair, Grab bars, and Ramped entry  PLOF: Independent with household mobility with device  PATIENT GOALS be able to get into the bed without assistance, improve ambulation, standing tolerance, going up steps.   OBJECTIVE:   DIAGNOSTIC FINDINGS: MRI of the brain 03/18/2020 showed multiple T2/FLAIR hyperintense foci in the periventricular,  juxtacortical and deep white matter.  There were no infratentorial lesions noted.  None of the foci enhanced.   Due to severe claustrophobia, the study was done with conscious sedation in the hospital  MRI of the cervical spine 03/18/2020 showed  T2 hyperintense foci at C1-C2, C2-C3, C4-C5, C5-C6 and T1.  There is a disc protrusion at C5-C6 to the left and a larger disc protrusion at C6-C7 to the right that distorts the thecal sac.  The adjacent spinal cord has normal signal.  There could be compression of the right C7 nerve root.       LOWER EXTREMITY MMT:    MMT Right Eval Left Eval  Hip flexion 2- 2  Hip extension 2- 2  Hip abduction 2+ 2+  Hip adduction 2+ 2+  Knee flexion 2- 2  Knee extension 2 2  Ankle dorsiflexion trace trace  Ankle plantarflexion trace trace  (Blank rows = not tested)    TODAY'S TREATMENT:  In // bars: -standing weight shift 10x each side x 2 sets,  -forward/backward step 5x each LE ; challenging for LLE  Seated: I-sometric contraction into PT hand: cueing for muscle contraction- 10x each: abduction,flexion, and adduction. GTB row 10x GTB overhead Y  5x STS ; two rest breaks  -Ambulation: Patient ambulated 33 feet with bariatric RW, CGA with close w/c follow- exhibiting good hip swing- short reciprocal steps, decreased step length/knee flexion yet good ability to stand erect and keep LE advancing. Limited only by fatigue.     PATIENT EDUCATION: Education details: goals, POC, outcome measures Person educated: Patient Education method: Explanation, Demonstration, Tactile cues, and Verbal cues Education comprehension: verbalized understanding, returned demonstration, verbal cues required, and tactile cues required   HOME EXERCISE PROGRAM: Give next session    GOALS: Goals reviewed with patient? Yes  SHORT TERM GOALS: Target date: 03/28/2022  Patient will be independent in home exercise program to improve strength/mobility for better  functional independence with ADLs. Baseline: Goal status: INITIAL    LONG TERM GOALS: Target date: 05/09/2022  Patient will increase FOTO score to equal to or greater than  52%   to demonstrate statistically significant improvement in mobility and quality of life Baseline: 10/10: 42% Goal status: INITIAL  2.  Patient (> 70 years old) will complete five times sit to stand test in < 15 seconds indicating an increased LE strength and improved balance. Baseline: 10/10: 38 seconds with one plop heavy UE support Goal status: INITIAL  3.  Patient will increase 10 meter walk test to >0.5 m/s as to improve gait speed for better community ambulation and to reduce fall risk. Baseline: 10/10: 54 seconds with bRW andw/c follow Goal status: INITIAL  4.  Patient will be able to get into bed independently with use of railing to increase independence with mobility and nighttime routine.  Baseline: 10/10: needs complete assistance to bring RLE into bed.  Goal status: INITIAL  5.  Patient will go up 2 small steps to enter sisters house with min A.  Baseline: 10/10: unable to perform  Goal status: INITIAL   ASSESSMENT:  CLINICAL IMPRESSION: Patient is having difficulty with LLE recruitment and movement in // bars.  She is highly motivated for progression of mobility. Her sit to stands require two rest breaks due to fatigue.Patient does not overheat this session not requiring use of ice. Patient educated on safe mobility and listening to her left leg when it is fatiguing. Patient will benefit from skilled physical therapy to increase strength, mobility, and stability for decreased fall risk and improved quality of life.    OBJECTIVE IMPAIRMENTS Abnormal gait, decreased activity tolerance, decreased balance, decreased coordination, decreased endurance, decreased mobility, difficulty walking, decreased ROM, decreased strength, hypomobility, increased edema, impaired perceived functional  ability, increased  muscle spasms, impaired flexibility, impaired tone, impaired UE functional use, improper body mechanics, postural dysfunction, obesity, and pain.   ACTIVITY LIMITATIONS carrying, lifting, bending, standing, squatting, sleeping, stairs, transfers, bed mobility, bathing, toileting, dressing, reach over head, locomotion level, and caring for others  PARTICIPATION LIMITATIONS: meal prep, cleaning, laundry, shopping, community activity, and yard work  PERSONAL FACTORS Age, Fitness, Past/current experiences, Sex, Social background, Time since onset of injury/illness/exacerbation, Transportation, and 3+ comorbidities: back pain, CBP, chronic L shoulder pain, galactorrhea, neuropathy, HPV, hypercholestermeia, HTN, MS, osteopenia, PONV, wrist fracture  are also affecting patient's functional outcome.   REHAB POTENTIAL: Fair    CLINICAL DECISION MAKING: Evolving/moderate complexity  EVALUATION COMPLEXITY: Moderate  PLAN: PT FREQUENCY: 2x/week  PT DURATION: 12 weeks  PLANNED INTERVENTIONS: Therapeutic exercises, Therapeutic activity, Neuromuscular re-education, Balance training, Gait training, Patient/Family education, Self Care, Joint mobilization, Stair training, Vestibular training, Canalith repositioning, Visual/preceptual remediation/compensation, Orthotic/Fit training, DME instructions, Dry Needling, Electrical stimulation, Wheelchair mobility training, Spinal mobilization, Cryotherapy, Moist heat, Taping, Vasopneumatic device, Traction, Ultrasound, Ionotophoresis 4mg /ml Dexamethasone, Manual therapy, and Re-evaluation  PLAN FOR NEXT SESSION: standing balance and strengthening    Janna Arch, PT 02/23/2022, 11:23 AM

## 2022-02-23 ENCOUNTER — Ambulatory Visit: Payer: PPO

## 2022-02-23 DIAGNOSIS — R269 Unspecified abnormalities of gait and mobility: Secondary | ICD-10-CM

## 2022-02-23 DIAGNOSIS — R262 Difficulty in walking, not elsewhere classified: Secondary | ICD-10-CM

## 2022-02-23 DIAGNOSIS — M6281 Muscle weakness (generalized): Secondary | ICD-10-CM

## 2022-02-23 DIAGNOSIS — R2681 Unsteadiness on feet: Secondary | ICD-10-CM

## 2022-02-27 NOTE — Therapy (Signed)
OUTPATIENT PHYSICAL THERAPY NEURO TREATMENT   Patient Name: Lisa Williams MRN: [id number] DOB:1961/04/02, 61 y.o., female Today's Date: 02/28/2022   PCP: Lauro Regulus  REFERRING PROVIDER: Asa Lente MD    PT End of Session - 02/28/22 1016     Visit Number 5    Number of Visits 24    Date for PT Re-Evaluation 05/09/22    Authorization Type 5/10: eval 10/10    PT Start Time 1015    PT Stop Time 1056    PT Time Calculation (min) 41 min    Equipment Utilized During Treatment Gait belt    Activity Tolerance Patient tolerated treatment well    Behavior During Therapy WFL for tasks assessed/performed                 Past Medical History:  Diagnosis Date   Abdominal pain, right upper quadrant    Back pain    Calculus of kidney 12/09/2013   Chronic back pain    unspecified   Chronic left shoulder pain 07/19/2015   Complication of anesthesia    Functional disorder of bladder    other   Galactorrhea 11/26/2014   Chronic    Hereditary and idiopathic neuropathy 08/19/2013   HPV test positive    Hypercholesteremia 08/19/2013   Hypertension    Incomplete bladder emptying    Microscopic hematuria    MS (multiple sclerosis) (HCC)    Muscle spasticity 05/21/2014   Nonspecific findings on examination of urine    other   Osteopenia    PONV (postoperative nausea and vomiting)    Status post laparoscopic supracervical hysterectomy 11/26/2014   Tobacco user 11/26/2014   Wrist fracture    Past Surgical History:  Procedure Laterality Date   bilateral tubal ligation  1996   BREAST CYST EXCISION Left 2002   FRACTURE SURGERY     KNEE SURGERY     right   LAPAROSCOPIC SUPRACERVICAL HYSTERECTOMY  08/05/2013   ORIF WRIST FRACTURE Left 01/17/2017   Procedure: OPEN REDUCTION INTERNAL FIXATION (ORIF) WRIST FRACTURE;  Surgeon: Lyndle Herrlich, MD;  Location: [facility];  Service: Orthopedics;  Laterality: Left;   RADIOLOGY WITH ANESTHESIA N/A 03/18/2020   Procedure:  MRI WITH ANESTHESIA CERVICAL SPINE AND BRAIN  WITH AND WITHOUT CONTRAST;  Surgeon: Radiologist, Medication, MD;  Location: [facility];  Service: Radiology;  Laterality: N/A;   TUBAL LIGATION Bilateral    VAGINAL HYSTERECTOMY  03/2006   Patient Active Problem List   Diagnosis Date Noted   Abscess of left groin 11/15/2021   Abnormal LFTs 11/15/2021   Acute respiratory disease due to COVID-19 virus 11/21/2020   Weakness    Hypoalbuminemia due to protein-calorie malnutrition (HCC)    Neurogenic bowel    Neurogenic bladder    Labile blood pressure    Neuropathic pain    Abscess of female pelvis    SVT (supraventricular tachycardia)    Radial styloid tenosynovitis 03/12/2018   Wheelchair confinement 02/27/2018   Localized osteoporosis with current pathological fracture with routine healing 01/19/2017   Wrist fracture 01/16/2017   Sprain of ankle 03/23/2016   Closed fracture of lateral malleolus 03/16/2016   Health care maintenance 01/24/2016   Blood pressure elevated without history of HTN 10/25/2015   Essential hypertension 10/25/2015   Multiple sclerosis (HCC) 10/02/2015   Chronic left shoulder pain 07/19/2015   Multiple sclerosis exacerbation (HCC) 07/14/2015   MS (multiple sclerosis) (HCC) 11/26/2014   Increased body mass index 11/26/2014   HPV  test positive 11/26/2014   Status post laparoscopic supracervical hysterectomy 11/26/2014   Galactorrhea 11/26/2014   Back ache 05/21/2014   Adiposity 05/21/2014   Disordered sleep 05/21/2014   Muscle spasticity 05/21/2014   Spasticity 05/21/2014   Calculus of kidney 12/09/2013   Renal colic 12/09/2013   Hypercholesteremia 08/19/2013   Hereditary and idiopathic neuropathy 08/19/2013   Hypercholesterolemia without hypertriglyceridemia 08/19/2013   Bladder infection, chronic 07/25/2012   Disorder of bladder function 07/25/2012   Incomplete bladder emptying 07/25/2012   Microscopic hematuria 07/25/2012   Right upper quadrant pain  07/25/2012    ONSET DATE: 1995  REFERRING DIAG: MS  THERAPY DIAG:  Difficulty in walking, not elsewhere classified  Unsteadiness on feet  Muscle weakness (generalized)  Abnormality of gait and mobility  Rationale for Evaluation and Treatment Rehabilitation  SUBJECTIVE:                                                                                                                                                                                              SUBJECTIVE STATEMENT: Patient reports she is going to go on a cruise next year. Wants to start working in therapy on mobility for cruise.    Pt accompanied by: self  PERTINENT HISTORY: Patient is retuning to PT after hospitalization on 11/14/21 where she then went to SNF and then received home health therapy which stopped 02/08/22. Patient has weakness in BLE with RLE>LLE. She drives with hand controls. Patient has been diagnosed with MS in 1995.  PMH includes: back pain, CBP, chronic L shoulder pain, galactorrhea, neuropathy, HPV, hypercholestermeia, HTN, MS, osteopenia, PONV, wrist fracture. Started back driving about two weeks ago.   PAIN:  Are you having pain? Yes: NPRS scale: 2/10 Pain location: abdomen to R leg Pain description: spasm Aggravating factors: unknown Relieving factors: n/a  PRECAUTIONS: Fall  WEIGHT BEARING RESTRICTIONS No  FALLS: Has patient fallen in last 6 months? No  LIVING ENVIRONMENT: Lives with: lives with their family Lives in: House/apartment Stairs: Yes: Internal: full flight steps; but lives fully on first floor Has following equipment at home: Dan Humphreys - 2 wheeled, Wheelchair (manual), shower chair, Grab bars, and Ramped entry  PLOF: Independent with household mobility with device  PATIENT GOALS be able to get into the bed without assistance, improve ambulation, standing tolerance, going up steps.   OBJECTIVE:   DIAGNOSTIC FINDINGS: MRI of the brain 03/18/2020 showed multiple T2/FLAIR  hyperintense foci in the periventricular, juxtacortical and deep white matter.  There were no infratentorial lesions noted.  None of the foci enhanced.   Due to severe claustrophobia, the study was done with conscious  sedation in the hospital  MRI of the cervical spine 03/18/2020 showed T2 hyperintense foci at C1-C2, C2-C3, C4-C5, C5-C6 and T1.  There is a disc protrusion at C5-C6 to the left and a larger disc protrusion at C6-C7 to the right that distorts the thecal sac.  The adjacent spinal cord has normal signal.  There could be compression of the right C7 nerve root.       LOWER EXTREMITY MMT:    MMT Right Eval Left Eval  Hip flexion 2- 2  Hip extension 2- 2  Hip abduction 2+ 2+  Hip adduction 2+ 2+  Knee flexion 2- 2  Knee extension 2 2  Ankle dorsiflexion trace trace  Ankle plantarflexion trace trace  (Blank rows = not tested)    TODAY'S TREATMENT:  In // bars: -standing weight shift 10x each side x 2 sets,  -PVC pipe: chest press 10x, raise 10x, witches brew 10x    -Ambulation: Patient ambulated 62 feet with bariatric RW, CGA with close w/c follow- exhibiting good hip swing- short reciprocal steps, decreased step length/knee flexion yet good ability to stand erect and keep LE advancing. Limited only by fatigue.    Seated: Seated boxing: cues for sequencing, body mechanics, and pace/rhythm for functional contraction and timing of muscle recruitment: -Cross body punches to mitts on PT hands with no back support for focused core stabilization with crossing midline 2x 60 seconds  -cross body punch; duck no back support for core stabilization 60 seconds Seated windmill 10x each side    PATIENT EDUCATION: Education details: goals, POC, outcome measures Person educated: Patient Education method: Explanation, Demonstration, Tactile cues, and Verbal cues Education comprehension: verbalized understanding, returned demonstration, verbal cues required, and tactile cues  required   HOME EXERCISE PROGRAM: Give next session    GOALS: Goals reviewed with patient? Yes  SHORT TERM GOALS: Target date: 03/28/2022  Patient will be independent in home exercise program to improve strength/mobility for better functional independence with ADLs. Baseline: Goal status: INITIAL    LONG TERM GOALS: Target date: 05/09/2022  Patient will increase FOTO score to equal to or greater than  52%   to demonstrate statistically significant improvement in mobility and quality of life Baseline: 10/10: 42% Goal status: INITIAL  2.  Patient (> 74 years old) will complete five times sit to stand test in < 15 seconds indicating an increased LE strength and improved balance. Baseline: 10/10: 38 seconds with one plop heavy UE support Goal status: INITIAL  3.  Patient will increase 10 meter walk test to >0.5 m/s as to improve gait speed for better community ambulation and to reduce fall risk. Baseline: 10/10: 54 seconds with bRW andw/c follow Goal status: INITIAL  4.  Patient will be able to get into bed independently with use of railing to increase independence with mobility and nighttime routine.  Baseline: 10/10: needs complete assistance to bring RLE into bed.  Goal status: INITIAL  5.  Patient will go up 2 small steps to enter sisters house with min A.  Baseline: 10/10: unable to perform  Goal status: INITIAL   ASSESSMENT:  CLINICAL IMPRESSION: Patient responded well to skilled therapy with no adverse reactions. Pt. performed well with standing exercises to improve balance in weight bearing and with seated boxing exercises to improve cardiovascular fitness and functional reach. Patient was able to ambulate 62 ft with a bariatric rolling walker and a wheelchair follow with 1 rest break. Patient will benefit from skilled physical therapy to increase  strength, mobility, and stability for decreased fall risk and improved quality of life.    OBJECTIVE IMPAIRMENTS Abnormal  gait, decreased activity tolerance, decreased balance, decreased coordination, decreased endurance, decreased mobility, difficulty walking, decreased ROM, decreased strength, hypomobility, increased edema, impaired perceived functional ability, increased muscle spasms, impaired flexibility, impaired tone, impaired UE functional use, improper body mechanics, postural dysfunction, obesity, and pain.   ACTIVITY LIMITATIONS carrying, lifting, bending, standing, squatting, sleeping, stairs, transfers, bed mobility, bathing, toileting, dressing, reach over head, locomotion level, and caring for others  PARTICIPATION LIMITATIONS: meal prep, cleaning, laundry, shopping, community activity, and yard work  PERSONAL FACTORS Age, Fitness, Past/current experiences, Sex, Social background, Time since onset of injury/illness/exacerbation, Transportation, and 3+ comorbidities: back pain, CBP, chronic L shoulder pain, galactorrhea, neuropathy, HPV, hypercholestermeia, HTN, MS, osteopenia, PONV, wrist fracture  are also affecting patient's functional outcome.   REHAB POTENTIAL: Fair    CLINICAL DECISION MAKING: Evolving/moderate complexity  EVALUATION COMPLEXITY: Moderate  PLAN: PT FREQUENCY: 2x/week  PT DURATION: 12 weeks  PLANNED INTERVENTIONS: Therapeutic exercises, Therapeutic activity, Neuromuscular re-education, Balance training, Gait training, Patient/Family education, Self Care, Joint mobilization, Stair training, Vestibular training, Canalith repositioning, Visual/preceptual remediation/compensation, Orthotic/Fit training, DME instructions, Dry Needling, Electrical stimulation, Wheelchair mobility training, Spinal mobilization, Cryotherapy, Moist heat, Taping, Vasopneumatic device, Traction, Ultrasound, Ionotophoresis 4mg /ml Dexamethasone, Manual therapy, and Re-evaluation  PLAN FOR NEXT SESSION: standing balance and strengthening    Janna Arch, PT 02/28/2022, 11:12 AM

## 2022-02-28 ENCOUNTER — Ambulatory Visit: Payer: PPO

## 2022-02-28 DIAGNOSIS — R262 Difficulty in walking, not elsewhere classified: Secondary | ICD-10-CM | POA: Diagnosis not present

## 2022-02-28 DIAGNOSIS — R2681 Unsteadiness on feet: Secondary | ICD-10-CM

## 2022-02-28 DIAGNOSIS — M6281 Muscle weakness (generalized): Secondary | ICD-10-CM

## 2022-02-28 DIAGNOSIS — R269 Unspecified abnormalities of gait and mobility: Secondary | ICD-10-CM

## 2022-03-01 ENCOUNTER — Other Ambulatory Visit: Payer: Self-pay | Admitting: *Deleted

## 2022-03-01 DIAGNOSIS — G35 Multiple sclerosis: Secondary | ICD-10-CM

## 2022-03-01 MED ORDER — MAYZENT 2 MG PO TABS: 2.0000 mg | ORAL_TABLET | Freq: Every day | ORAL | 11 refills | Status: DC

## 2022-03-01 NOTE — Therapy (Signed)
OUTPATIENT PHYSICAL THERAPY NEURO TREATMENT   Patient Name: Lisa Williams MRN: [id number] DOB:[birth year], 61 y.o., female Today's Date: 03/02/2022   PCP: Lauro Regulus  REFERRING PROVIDER: Asa Lente MD    PT End of Session - 03/02/22 0959     Visit Number 6    Number of Visits 24    Date for PT Re-Evaluation 05/09/22    Authorization Type 6/10: eval 10/10    PT Start Time 1015    PT Stop Time 1058    PT Time Calculation (min) 43 min    Equipment Utilized During Treatment Gait belt    Activity Tolerance Patient tolerated treatment well    Behavior During Therapy WFL for tasks assessed/performed                  Past Medical History:  Diagnosis Date   Abdominal pain, right upper quadrant    Back pain    Calculus of kidney 12/09/2013   Chronic back pain    unspecified   Chronic left shoulder pain 07/19/2015   Complication of anesthesia    Functional disorder of bladder    other   Galactorrhea 11/26/2014   Chronic    Hereditary and idiopathic neuropathy 08/19/2013   HPV test positive    Hypercholesteremia 08/19/2013   Hypertension    Incomplete bladder emptying    Microscopic hematuria    MS (multiple sclerosis) (HCC)    Muscle spasticity 05/21/2014   Nonspecific findings on examination of urine    other   Osteopenia    PONV (postoperative nausea and vomiting)    Status post laparoscopic supracervical hysterectomy 11/26/2014   Tobacco user 11/26/2014   Wrist fracture    Past Surgical History:  Procedure Laterality Date   bilateral tubal ligation  1996   BREAST CYST EXCISION Left 2002   FRACTURE SURGERY     KNEE SURGERY     right   LAPAROSCOPIC SUPRACERVICAL HYSTERECTOMY  08/05/2013   ORIF WRIST FRACTURE Left 01/17/2017   Procedure: OPEN REDUCTION INTERNAL FIXATION (ORIF) WRIST FRACTURE;  Surgeon: Lyndle Herrlich, MD;  Location: [facility];  Service: Orthopedics;  Laterality: Left;   RADIOLOGY WITH ANESTHESIA N/A 03/18/2020    Procedure: MRI WITH ANESTHESIA CERVICAL SPINE AND BRAIN  WITH AND WITHOUT CONTRAST;  Surgeon: Radiologist, Medication, MD;  Location: [facility];  Service: Radiology;  Laterality: N/A;   TUBAL LIGATION Bilateral    VAGINAL HYSTERECTOMY  03/2006   Patient Active Problem List   Diagnosis Date Noted   Abscess of left groin 11/15/2021   Abnormal LFTs 11/15/2021   Acute respiratory disease due to COVID-19 virus 11/21/2020   Weakness    Hypoalbuminemia due to protein-calorie malnutrition (HCC)    Neurogenic bowel    Neurogenic bladder    Labile blood pressure    Neuropathic pain    Abscess of female pelvis    SVT (supraventricular tachycardia)    Radial styloid tenosynovitis 03/12/2018   Wheelchair confinement 02/27/2018   Localized osteoporosis with current pathological fracture with routine healing 01/19/2017   Wrist fracture 01/16/2017   Sprain of ankle 03/23/2016   Closed fracture of lateral malleolus 03/16/2016   Health care maintenance 01/24/2016   Blood pressure elevated without history of HTN 10/25/2015   Essential hypertension 10/25/2015   Multiple sclerosis (HCC) 10/02/2015   Chronic left shoulder pain 07/19/2015   Multiple sclerosis exacerbation (HCC) 07/14/2015   MS (multiple sclerosis) (HCC) 11/26/2014   Increased body mass index 11/26/2014  HPV test positive 11/26/2014   Status post laparoscopic supracervical hysterectomy 11/26/2014   Galactorrhea 11/26/2014   Back ache 05/21/2014   Adiposity 05/21/2014   Disordered sleep 05/21/2014   Muscle spasticity 05/21/2014   Spasticity 05/21/2014   Calculus of kidney 72/53/6644   Renal colic 03/47/4259   Hypercholesteremia 08/19/2013   Hereditary and idiopathic neuropathy 08/19/2013   Hypercholesterolemia without hypertriglyceridemia 08/19/2013   Bladder infection, chronic 07/25/2012   Disorder of bladder function 07/25/2012   Incomplete bladder emptying 07/25/2012   Microscopic hematuria 07/25/2012   Right upper quadrant  pain 07/25/2012    ONSET DATE: 1995  REFERRING DIAG: MS  THERAPY DIAG:  Difficulty in walking, not elsewhere classified  Unsteadiness on feet  Muscle weakness (generalized)  Abnormality of gait and mobility  Rationale for Evaluation and Treatment Rehabilitation  SUBJECTIVE:                                                                                                                                                                                              SUBJECTIVE STATEMENT: Patient reports she is feeling well today. No falls or LOB since last session.   Pt accompanied by: self  PERTINENT HISTORY: Patient is retuning to PT after hospitalization on 11/14/21 where she then went to SNF and then received home health therapy which stopped 02/08/22. Patient has weakness in BLE with RLE>LLE. She drives with hand controls. Patient has been diagnosed with MS in 1995.  PMH includes: back pain, CBP, chronic L shoulder pain, galactorrhea, neuropathy, HPV, hypercholestermeia, HTN, MS, osteopenia, PONV, wrist fracture. Started back driving about two weeks ago.   PAIN:  Are you having pain? Yes: NPRS scale: 2/10 Pain location: abdomen to R leg Pain description: spasm Aggravating factors: unknown Relieving factors: n/a  PRECAUTIONS: Fall  WEIGHT BEARING RESTRICTIONS No  FALLS: Has patient fallen in last 6 months? No  LIVING ENVIRONMENT: Lives with: lives with their family Lives in: House/apartment Stairs: Yes: Internal: full flight steps; but lives fully on first floor Has following equipment at home: Gilford Rile - 2 wheeled, Wheelchair (manual), shower chair, Grab bars, and Ramped entry  PLOF: Independent with household mobility with device  PATIENT GOALS be able to get into the bed without assistance, improve ambulation, standing tolerance, going up steps.   OBJECTIVE:   DIAGNOSTIC FINDINGS: MRI of the brain 03/18/2020 showed multiple T2/FLAIR hyperintense foci in the  periventricular, juxtacortical and deep white matter.  There were no infratentorial lesions noted.  None of the foci enhanced.   Due to severe claustrophobia, the study was done with conscious sedation in the hospital  MRI of the  cervical spine 03/18/2020 showed T2 hyperintense foci at C1-C2, C2-C3, C4-C5, C5-C6 and T1.  There is a disc protrusion at C5-C6 to the left and a larger disc protrusion at C6-C7 to the right that distorts the thecal sac.  The adjacent spinal cord has normal signal.  There could be compression of the right C7 nerve root.       LOWER EXTREMITY MMT:    MMT Right Eval Left Eval  Hip flexion 2- 2  Hip extension 2- 2  Hip abduction 2+ 2+  Hip adduction 2+ 2+  Knee flexion 2- 2  Knee extension 2 2  Ankle dorsiflexion trace trace  Ankle plantarflexion trace trace  (Blank rows = not tested)    TODAY'S TREATMENT:   -Ambulation: Patient ambulated 140 feet total with one seated rest break after 75 ft. with bariatric RW, CGA with close w/c follow- exhibiting good hip swing- short reciprocal steps, decreased step length/knee flexion yet good ability to stand erect and keep LE advancing. Limited only by fatigue.    Seated: Weighted ball (orange): -chest press 10x -bicep curl 10x -PNF 10x each side  Forward/backwards trunk lean 10x Sit to stand 5x; rest after 2.   PATIENT EDUCATION: Education details: goals, POC, outcome measures Person educated: Patient Education method: Explanation, Demonstration, Tactile cues, and Verbal cues Education comprehension: verbalized understanding, returned demonstration, verbal cues required, and tactile cues required   HOME EXERCISE PROGRAM: Give next session    GOALS: Goals reviewed with patient? Yes  SHORT TERM GOALS: Target date: 03/28/2022  Patient will be independent in home exercise program to improve strength/mobility for better functional independence with ADLs. Baseline: Goal status: INITIAL    LONG TERM  GOALS: Target date: 05/09/2022  Patient will increase FOTO score to equal to or greater than  52%   to demonstrate statistically significant improvement in mobility and quality of life Baseline: 10/10: 42% Goal status: INITIAL  2.  Patient (> 10 years old) will complete five times sit to stand test in < 15 seconds indicating an increased LE strength and improved balance. Baseline: 10/10: 38 seconds with one plop heavy UE support Goal status: INITIAL  3.  Patient will increase 10 meter walk test to >0.5 m/s as to improve gait speed for better community ambulation and to reduce fall risk. Baseline: 10/10: 54 seconds with bRW andw/c follow Goal status: INITIAL  4.  Patient will be able to get into bed independently with use of railing to increase independence with mobility and nighttime routine.  Baseline: 10/10: needs complete assistance to bring RLE into bed.  Goal status: INITIAL  5.  Patient will go up 2 small steps to enter sisters house with min A.  Baseline: 10/10: unable to perform  Goal status: INITIAL   ASSESSMENT:  CLINICAL IMPRESSION: Patient tolerates prolonged ambulation this sessoin with decreased instability and improved step length. She requires one rest break after 75 ft. Patient challenged with sit to stands which will continue to be an area of focus.  Patient will benefit from skilled physical therapy to increase strength, mobility, and stability for decreased fall risk and improved quality of life.    OBJECTIVE IMPAIRMENTS Abnormal gait, decreased activity tolerance, decreased balance, decreased coordination, decreased endurance, decreased mobility, difficulty walking, decreased ROM, decreased strength, hypomobility, increased edema, impaired perceived functional ability, increased muscle spasms, impaired flexibility, impaired tone, impaired UE functional use, improper body mechanics, postural dysfunction, obesity, and pain.   ACTIVITY LIMITATIONS carrying, lifting,  bending, standing, squatting, sleeping,  stairs, transfers, bed mobility, bathing, toileting, dressing, reach over head, locomotion level, and caring for others  PARTICIPATION LIMITATIONS: meal prep, cleaning, laundry, shopping, community activity, and yard work  PERSONAL FACTORS Age, Fitness, Past/current experiences, Sex, Social background, Time since onset of injury/illness/exacerbation, Transportation, and 3+ comorbidities: back pain, CBP, chronic L shoulder pain, galactorrhea, neuropathy, HPV, hypercholestermeia, HTN, MS, osteopenia, PONV, wrist fracture  are also affecting patient's functional outcome.   REHAB POTENTIAL: Fair    CLINICAL DECISION MAKING: Evolving/moderate complexity  EVALUATION COMPLEXITY: Moderate  PLAN: PT FREQUENCY: 2x/week  PT DURATION: 12 weeks  PLANNED INTERVENTIONS: Therapeutic exercises, Therapeutic activity, Neuromuscular re-education, Balance training, Gait training, Patient/Family education, Self Care, Joint mobilization, Stair training, Vestibular training, Canalith repositioning, Visual/preceptual remediation/compensation, Orthotic/Fit training, DME instructions, Dry Needling, Electrical stimulation, Wheelchair mobility training, Spinal mobilization, Cryotherapy, Moist heat, Taping, Vasopneumatic device, Traction, Ultrasound, Ionotophoresis 4mg /ml Dexamethasone, Manual therapy, and Re-evaluation  PLAN FOR NEXT SESSION: standing balance and strengthening    Janna Arch, PT 03/02/2022, 11:55 AM

## 2022-03-02 ENCOUNTER — Ambulatory Visit: Payer: PPO

## 2022-03-02 DIAGNOSIS — R262 Difficulty in walking, not elsewhere classified: Secondary | ICD-10-CM | POA: Diagnosis not present

## 2022-03-02 DIAGNOSIS — R2681 Unsteadiness on feet: Secondary | ICD-10-CM

## 2022-03-02 DIAGNOSIS — R269 Unspecified abnormalities of gait and mobility: Secondary | ICD-10-CM

## 2022-03-02 DIAGNOSIS — M6281 Muscle weakness (generalized): Secondary | ICD-10-CM

## 2022-03-07 ENCOUNTER — Ambulatory Visit: Payer: PPO

## 2022-03-07 DIAGNOSIS — R262 Difficulty in walking, not elsewhere classified: Secondary | ICD-10-CM

## 2022-03-07 DIAGNOSIS — R269 Unspecified abnormalities of gait and mobility: Secondary | ICD-10-CM

## 2022-03-07 DIAGNOSIS — R2681 Unsteadiness on feet: Secondary | ICD-10-CM

## 2022-03-07 DIAGNOSIS — M6281 Muscle weakness (generalized): Secondary | ICD-10-CM

## 2022-03-07 NOTE — Therapy (Signed)
OUTPATIENT PHYSICAL THERAPY NEURO TREATMENT   Patient Name: Lisa Williams MRN: [id number] DOB:28-Jun-1960, 61 y.o., female Today's Date: 03/07/2022   PCP: Lauro Regulus  REFERRING PROVIDER: Asa Lente MD    PT End of Session - 03/07/22 0953     Visit Number 7    Number of Visits 24    Date for PT Re-Evaluation 05/09/22    Authorization Type 7/10: eval 10/10    PT Start Time 1015    PT Stop Time 1059    PT Time Calculation (min) 44 min    Equipment Utilized During Treatment Gait belt    Activity Tolerance Patient tolerated treatment well    Behavior During Therapy WFL for tasks assessed/performed                   Past Medical History:  Diagnosis Date   Abdominal pain, right upper quadrant    Back pain    Calculus of kidney 12/09/2013   Chronic back pain    unspecified   Chronic left shoulder pain 07/19/2015   Complication of anesthesia    Functional disorder of bladder    other   Galactorrhea 11/26/2014   Chronic    Hereditary and idiopathic neuropathy 08/19/2013   HPV test positive    Hypercholesteremia 08/19/2013   Hypertension    Incomplete bladder emptying    Microscopic hematuria    MS (multiple sclerosis) (HCC)    Muscle spasticity 05/21/2014   Nonspecific findings on examination of urine    other   Osteopenia    PONV (postoperative nausea and vomiting)    Status post laparoscopic supracervical hysterectomy 11/26/2014   Tobacco user 11/26/2014   Wrist fracture    Past Surgical History:  Procedure Laterality Date   bilateral tubal ligation  1996   BREAST CYST EXCISION Left 2002   FRACTURE SURGERY     KNEE SURGERY     right   LAPAROSCOPIC SUPRACERVICAL HYSTERECTOMY  08/05/2013   ORIF WRIST FRACTURE Left 01/17/2017   Procedure: OPEN REDUCTION INTERNAL FIXATION (ORIF) WRIST FRACTURE;  Surgeon: Lyndle Herrlich, MD;  Location: [facility];  Service: Orthopedics;  Laterality: Left;   RADIOLOGY WITH ANESTHESIA N/A 03/18/2020    Procedure: MRI WITH ANESTHESIA CERVICAL SPINE AND BRAIN  WITH AND WITHOUT CONTRAST;  Surgeon: Radiologist, Medication, MD;  Location: [facility];  Service: Radiology;  Laterality: N/A;   TUBAL LIGATION Bilateral    VAGINAL HYSTERECTOMY  03/2006   Patient Active Problem List   Diagnosis Date Noted   Abscess of left groin 11/15/2021   Abnormal LFTs 11/15/2021   Acute respiratory disease due to COVID-19 virus 11/21/2020   Weakness    Hypoalbuminemia due to protein-calorie malnutrition (HCC)    Neurogenic bowel    Neurogenic bladder    Labile blood pressure    Neuropathic pain    Abscess of female pelvis    SVT (supraventricular tachycardia)    Radial styloid tenosynovitis 03/12/2018   Wheelchair confinement 02/27/2018   Localized osteoporosis with current pathological fracture with routine healing 01/19/2017   Wrist fracture 01/16/2017   Sprain of ankle 03/23/2016   Closed fracture of lateral malleolus 03/16/2016   Health care maintenance 01/24/2016   Blood pressure elevated without history of HTN 10/25/2015   Essential hypertension 10/25/2015   Multiple sclerosis (HCC) 10/02/2015   Chronic left shoulder pain 07/19/2015   Multiple sclerosis exacerbation (HCC) 07/14/2015   MS (multiple sclerosis) (HCC) 11/26/2014   Increased body mass index 11/26/2014  HPV test positive 11/26/2014   Status post laparoscopic supracervical hysterectomy 11/26/2014   Galactorrhea 11/26/2014   Back ache 05/21/2014   Adiposity 05/21/2014   Disordered sleep 05/21/2014   Muscle spasticity 05/21/2014   Spasticity 05/21/2014   Calculus of kidney 99991111   Renal colic 99991111   Hypercholesteremia 08/19/2013   Hereditary and idiopathic neuropathy 08/19/2013   Hypercholesterolemia without hypertriglyceridemia 08/19/2013   Bladder infection, chronic 07/25/2012   Disorder of bladder function 07/25/2012   Incomplete bladder emptying 07/25/2012   Microscopic hematuria 07/25/2012   Right upper quadrant  pain 07/25/2012    ONSET DATE: 1995  REFERRING DIAG: MS  THERAPY DIAG:  Difficulty in walking, not elsewhere classified  Unsteadiness on feet  Muscle weakness (generalized)  Abnormality of gait and mobility  Rationale for Evaluation and Treatment Rehabilitation  SUBJECTIVE:                                                                                                                                                                                              SUBJECTIVE STATEMENT: Patient had a fall on Saturday in the bathroom. Was able to control herself down to the floor but her knees gave out.   Pt accompanied by: self  PERTINENT HISTORY: Patient is retuning to PT after hospitalization on 11/14/21 where she then went to SNF and then received home health therapy which stopped 02/08/22. Patient has weakness in BLE with RLE>LLE. She drives with hand controls. Patient has been diagnosed with MS in 1995.  PMH includes: back pain, CBP, chronic L shoulder pain, galactorrhea, neuropathy, HPV, hypercholestermeia, HTN, MS, osteopenia, PONV, wrist fracture. Started back driving about two weeks ago.   PAIN:  Are you having pain? Yes: NPRS scale: 2/10 Pain location: abdomen to R leg Pain description: spasm Aggravating factors: unknown Relieving factors: n/a  PRECAUTIONS: Fall  WEIGHT BEARING RESTRICTIONS No  FALLS: Has patient fallen in last 6 months? No  LIVING ENVIRONMENT: Lives with: lives with their family Lives in: House/apartment Stairs: Yes: Internal: full flight steps; but lives fully on first floor Has following equipment at home: Gilford Rile - 2 wheeled, Wheelchair (manual), shower chair, Grab bars, and Ramped entry  PLOF: Independent with household mobility with device  PATIENT GOALS be able to get into the bed without assistance, improve ambulation, standing tolerance, going up steps.   OBJECTIVE:   DIAGNOSTIC FINDINGS: MRI of the brain 03/18/2020 showed multiple  T2/FLAIR hyperintense foci in the periventricular, juxtacortical and deep white matter.  There were no infratentorial lesions noted.  None of the foci enhanced.   Due to severe claustrophobia, the study was done with  conscious sedation in the hospital  MRI of the cervical spine 03/18/2020 showed T2 hyperintense foci at C1-C2, C2-C3, C4-C5, C5-C6 and T1.  There is a disc protrusion at C5-C6 to the left and a larger disc protrusion at C6-C7 to the right that distorts the thecal sac.  The adjacent spinal cord has normal signal.  There could be compression of the right C7 nerve root.       LOWER EXTREMITY MMT:    MMT Right Eval Left Eval  Hip flexion 2- 2  Hip extension 2- 2  Hip abduction 2+ 2+  Hip adduction 2+ 2+  Knee flexion 2- 2  Knee extension 2 2  Ankle dorsiflexion trace trace  Ankle plantarflexion trace trace  (Blank rows = not tested)    TODAY'S TREATMENT:  In // bars: -standing weight shift 10x each side -PVC pipe: chest press 10x, overhead raise 10x -BTB row 15x; 2 sets; standing rest break between  Sit to stand 5x; rest after 2.   Seated: -reach forward to grab ball in arc around patient, shoot into hoop x 4 minutes -large arm circles clockwise 10x, counterclockwise 10x -90 degrees abduction, flexion 10x each LE  Lateral trunk rotations/twists 10x each side with arms outstretched Hamstring lengthening 30 seconds each LE   PATIENT EDUCATION: Education details: goals, POC, outcome measures Person educated: Patient Education method: Explanation, Demonstration, Tactile cues, and Verbal cues Education comprehension: verbalized understanding, returned demonstration, verbal cues required, and tactile cues required   HOME EXERCISE PROGRAM: Give next session    GOALS: Goals reviewed with patient? Yes  SHORT TERM GOALS: Target date: 03/28/2022  Patient will be independent in home exercise program to improve strength/mobility for better functional independence  with ADLs. Baseline: Goal status: INITIAL    LONG TERM GOALS: Target date: 05/09/2022  Patient will increase FOTO score to equal to or greater than  52%   to demonstrate statistically significant improvement in mobility and quality of life Baseline: 10/10: 42% Goal status: INITIAL  2.  Patient (> 70 years old) will complete five times sit to stand test in < 15 seconds indicating an increased LE strength and improved balance. Baseline: 10/10: 38 seconds with one plop heavy UE support Goal status: INITIAL  3.  Patient will increase 10 meter walk test to >0.5 m/s as to improve gait speed for better community ambulation and to reduce fall risk. Baseline: 10/10: 54 seconds with bRW andw/c follow Goal status: INITIAL  4.  Patient will be able to get into bed independently with use of railing to increase independence with mobility and nighttime routine.  Baseline: 10/10: needs complete assistance to bring RLE into bed.  Goal status: INITIAL  5.  Patient will go up 2 small steps to enter sisters house with min A.  Baseline: 10/10: unable to perform  Goal status: INITIAL   ASSESSMENT:  CLINICAL IMPRESSION: Patient tolerates progressive standing interventions well with no LOB. Patient educated on need for keeping chair next to her while standing at home due to her still having LE weakness that can result in LE buckling as what happened the previous weekend. Patient agreeable. Patient is highly motivated throughout session.  Patient will benefit from skilled physical therapy to increase strength, mobility, and stability for decreased fall risk and improved quality of life.    OBJECTIVE IMPAIRMENTS Abnormal gait, decreased activity tolerance, decreased balance, decreased coordination, decreased endurance, decreased mobility, difficulty walking, decreased ROM, decreased strength, hypomobility, increased edema, impaired perceived functional ability, increased muscle  spasms, impaired flexibility,  impaired tone, impaired UE functional use, improper body mechanics, postural dysfunction, obesity, and pain.   ACTIVITY LIMITATIONS carrying, lifting, bending, standing, squatting, sleeping, stairs, transfers, bed mobility, bathing, toileting, dressing, reach over head, locomotion level, and caring for others  PARTICIPATION LIMITATIONS: meal prep, cleaning, laundry, shopping, community activity, and yard work  PERSONAL FACTORS Age, Fitness, Past/current experiences, Sex, Social background, Time since onset of injury/illness/exacerbation, Transportation, and 3+ comorbidities: back pain, CBP, chronic L shoulder pain, galactorrhea, neuropathy, HPV, hypercholestermeia, HTN, MS, osteopenia, PONV, wrist fracture  are also affecting patient's functional outcome.   REHAB POTENTIAL: Fair    CLINICAL DECISION MAKING: Evolving/moderate complexity  EVALUATION COMPLEXITY: Moderate  PLAN: PT FREQUENCY: 2x/week  PT DURATION: 12 weeks  PLANNED INTERVENTIONS: Therapeutic exercises, Therapeutic activity, Neuromuscular re-education, Balance training, Gait training, Patient/Family education, Self Care, Joint mobilization, Stair training, Vestibular training, Canalith repositioning, Visual/preceptual remediation/compensation, Orthotic/Fit training, DME instructions, Dry Needling, Electrical stimulation, Wheelchair mobility training, Spinal mobilization, Cryotherapy, Moist heat, Taping, Vasopneumatic device, Traction, Ultrasound, Ionotophoresis 4mg /ml Dexamethasone, Manual therapy, and Re-evaluation  PLAN FOR NEXT SESSION: standing balance and strengthening    Janna Arch, PT 03/07/2022, 11:28 AM

## 2022-03-08 NOTE — Therapy (Signed)
OUTPATIENT PHYSICAL THERAPY NEURO TREATMENT   Patient Name: Lisa Williams MRN: [id number] DOB:Sep 01, 1960, 61 y.o., female Today's Date: 03/09/2022   PCP: Kirk Ruths  REFERRING PROVIDER: Britt Bottom MD    PT End of Session - 03/09/22 1007     Visit Number 8    Number of Visits 24    Date for PT Re-Evaluation 05/09/22    Authorization Type 7/10: eval 10/10    PT Start Time 1010    PT Stop Time 1055    PT Time Calculation (min) 45 min    Equipment Utilized During Treatment Gait belt    Activity Tolerance Patient tolerated treatment well    Behavior During Therapy WFL for tasks assessed/performed                    Past Medical History:  Diagnosis Date   Abdominal pain, right upper quadrant    Back pain    Calculus of kidney 12/09/2013   Chronic back pain    unspecified   Chronic left shoulder pain 07/06/6008   Complication of anesthesia    Functional disorder of bladder    other   Galactorrhea 11/26/2014   Chronic    Hereditary and idiopathic neuropathy 08/19/2013   HPV test positive    Hypercholesteremia 08/19/2013   Hypertension    Incomplete bladder emptying    Microscopic hematuria    MS (multiple sclerosis) (HCC)    Muscle spasticity 05/21/2014   Nonspecific findings on examination of urine    other   Osteopenia    PONV (postoperative nausea and vomiting)    Status post laparoscopic supracervical hysterectomy 11/26/2014   Tobacco user 11/26/2014   Wrist fracture    Past Surgical History:  Procedure Laterality Date   bilateral tubal ligation  1996   BREAST CYST EXCISION Left 2002   FRACTURE SURGERY     KNEE SURGERY     right   LAPAROSCOPIC SUPRACERVICAL HYSTERECTOMY  08/05/2013   ORIF WRIST FRACTURE Left 01/17/2017   Procedure: OPEN REDUCTION INTERNAL FIXATION (ORIF) WRIST FRACTURE;  Surgeon: Lovell Sheehan, MD;  Location: [facility];  Service: Orthopedics;  Laterality: Left;   RADIOLOGY WITH ANESTHESIA N/A 03/18/2020    Procedure: MRI WITH ANESTHESIA CERVICAL SPINE AND BRAIN  WITH AND WITHOUT CONTRAST;  Surgeon: Radiologist, Medication, MD;  Location: [place];  Service: Radiology;  Laterality: N/A;   TUBAL LIGATION Bilateral    VAGINAL HYSTERECTOMY  03/2006   Patient Active Problem List   Diagnosis Date Noted   Abscess of left groin 11/15/2021   Abnormal LFTs 11/15/2021   Acute respiratory disease due to COVID-19 virus 11/21/2020   Weakness    Hypoalbuminemia due to protein-calorie malnutrition (HCC)    Neurogenic bowel    Neurogenic bladder    Labile blood pressure    Neuropathic pain    Abscess of female pelvis    SVT (supraventricular tachycardia)    Radial styloid tenosynovitis 03/12/2018   Wheelchair confinement 02/27/2018   Localized osteoporosis with current pathological fracture with routine healing 01/19/2017   Wrist fracture 01/16/2017   Sprain of ankle 03/23/2016   Closed fracture of lateral malleolus 03/16/2016   Health care maintenance 01/24/2016   Blood pressure elevated without history of HTN 10/25/2015   Essential hypertension 10/25/2015   Multiple sclerosis ([place]) 10/02/2015   Chronic left shoulder pain 07/19/2015   Multiple sclerosis exacerbation ([place]) 07/14/2015   MS (multiple sclerosis) ([place]) 11/26/2014   Increased body mass index 11/26/2014  HPV test positive 11/26/2014   Status post laparoscopic supracervical hysterectomy 11/26/2014   Galactorrhea 11/26/2014   Back ache 05/21/2014   Adiposity 05/21/2014   Disordered sleep 05/21/2014   Muscle spasticity 05/21/2014   Spasticity 05/21/2014   Calculus of kidney 12/09/2013   Renal colic 12/09/2013   Hypercholesteremia 08/19/2013   Hereditary and idiopathic neuropathy 08/19/2013   Hypercholesterolemia without hypertriglyceridemia 08/19/2013   Bladder infection, chronic 07/25/2012   Disorder of bladder function 07/25/2012   Incomplete bladder emptying 07/25/2012   Microscopic hematuria 07/25/2012   Right upper quadrant  pain 07/25/2012    ONSET DATE: 1995  REFERRING DIAG: MS  THERAPY DIAG:  Difficulty in walking, not elsewhere classified  Unsteadiness on feet  Muscle weakness (generalized)  Rationale for Evaluation and Treatment Rehabilitation  SUBJECTIVE:                                                                                                                                                                                              SUBJECTIVE STATEMENT: Patient had to drive today due to a mix up with scheduling with transportation.   Pt accompanied by: self  PERTINENT HISTORY: Patient is retuning to PT after hospitalization on 11/14/21 where she then went to SNF and then received home health therapy which stopped 02/08/22. Patient has weakness in BLE with RLE>LLE. She drives with hand controls. Patient has been diagnosed with MS in 1995.  PMH includes: back pain, CBP, chronic L shoulder pain, galactorrhea, neuropathy, HPV, hypercholestermeia, HTN, MS, osteopenia, PONV, wrist fracture. Started back driving about two weeks ago.   PAIN:  Are you having pain? Yes: NPRS scale: 2/10 Pain location: abdomen to R leg Pain description: spasm Aggravating factors: unknown Relieving factors: n/a  PRECAUTIONS: Fall  WEIGHT BEARING RESTRICTIONS No  FALLS: Has patient fallen in last 6 months? No  LIVING ENVIRONMENT: Lives with: lives with their family Lives in: House/apartment Stairs: Yes: Internal: full flight steps; but lives fully on first floor Has following equipment at home: Dan Humphreys - 2 wheeled, Wheelchair (manual), shower chair, Grab bars, and Ramped entry  PLOF: Independent with household mobility with device  PATIENT GOALS be able to get into the bed without assistance, improve ambulation, standing tolerance, going up steps.   OBJECTIVE:   DIAGNOSTIC FINDINGS: MRI of the brain 03/18/2020 showed multiple T2/FLAIR hyperintense foci in the periventricular, juxtacortical and deep white  matter.  There were no infratentorial lesions noted.  None of the foci enhanced.   Due to severe claustrophobia, the study was done with conscious sedation in the hospital  MRI of the cervical spine 03/18/2020 showed T2 hyperintense  foci at C1-C2, C2-C3, C4-C5, C5-C6 and T1.  There is a disc protrusion at C5-C6 to the left and a larger disc protrusion at C6-C7 to the right that distorts the thecal sac.  The adjacent spinal cord has normal signal.  There could be compression of the right C7 nerve root.       LOWER EXTREMITY MMT:    MMT Right Eval Left Eval  Hip flexion 2- 2  Hip extension 2- 2  Hip abduction 2+ 2+  Hip adduction 2+ 2+  Knee flexion 2- 2  Knee extension 2 2  Ankle dorsiflexion trace trace  Ankle plantarflexion trace trace  (Blank rows = not tested)    TODAY'S TREATMENT:   Ambulation: Patient ambulated 90 feet total with one seated rest break after 55 ft. with bariatric RW, CGA with close w/c follow- exhibiting good hip swing- short reciprocal steps, decreased step length/knee flexion yet good ability to stand erect and keep LE advancing. Limited only by fatigue.     Seated: On dynadisc: -lateral weight shift with arms crossed 10x -forward/backwards weight shift 10x -hand clap for pertubation and reaction timing x2 minutes  Balloon taps reaching for ball with focus on reaction timing, sequencing, and spatial awareness x 4 minutes Hamstring isometric df focus 10x each LE, pf focus 10x each LE  PATIENT EDUCATION: Education details: goals, POC, outcome measures Person educated: Patient Education method: Explanation, Demonstration, Tactile cues, and Verbal cues Education comprehension: verbalized understanding, returned demonstration, verbal cues required, and tactile cues required   HOME EXERCISE PROGRAM: Give next session    GOALS: Goals reviewed with patient? Yes  SHORT TERM GOALS: Target date: 03/28/2022  Patient will be independent in home  exercise program to improve strength/mobility for better functional independence with ADLs. Baseline: Goal status: INITIAL    LONG TERM GOALS: Target date: 05/09/2022  Patient will increase FOTO score to equal to or greater than  52%   to demonstrate statistically significant improvement in mobility and quality of life Baseline: 10/10: 42% Goal status: INITIAL  2.  Patient (> 44 years old) will complete five times sit to stand test in < 15 seconds indicating an increased LE strength and improved balance. Baseline: 10/10: 38 seconds with one plop heavy UE support Goal status: INITIAL  3.  Patient will increase 10 meter walk test to >0.5 m/s as to improve gait speed for better community ambulation and to reduce fall risk. Baseline: 10/10: 54 seconds with bRW andw/c follow Goal status: INITIAL  4.  Patient will be able to get into bed independently with use of railing to increase independence with mobility and nighttime routine.  Baseline: 10/10: needs complete assistance to bring RLE into bed.  Goal status: INITIAL  5.  Patient will go up 2 small steps to enter sisters house with min A.  Baseline: 10/10: unable to perform  Goal status: INITIAL   ASSESSMENT:  CLINICAL IMPRESSION: Patient ambulation limited due to patient having to drive self today. Patient is able to tolerate standing and ambulation but does fatigue quicker this session. Progressive strengthening in seated tolerated well.   Patient will benefit from skilled physical therapy to increase strength, mobility, and stability for decreased fall risk and improved quality of life.    OBJECTIVE IMPAIRMENTS Abnormal gait, decreased activity tolerance, decreased balance, decreased coordination, decreased endurance, decreased mobility, difficulty walking, decreased ROM, decreased strength, hypomobility, increased edema, impaired perceived functional ability, increased muscle spasms, impaired flexibility, impaired tone, impaired UE  functional use, improper  body mechanics, postural dysfunction, obesity, and pain.   ACTIVITY LIMITATIONS carrying, lifting, bending, standing, squatting, sleeping, stairs, transfers, bed mobility, bathing, toileting, dressing, reach over head, locomotion level, and caring for others  PARTICIPATION LIMITATIONS: meal prep, cleaning, laundry, shopping, community activity, and yard work  PERSONAL FACTORS Age, Fitness, Past/current experiences, Sex, Social background, Time since onset of injury/illness/exacerbation, Transportation, and 3+ comorbidities: back pain, CBP, chronic L shoulder pain, galactorrhea, neuropathy, HPV, hypercholestermeia, HTN, MS, osteopenia, PONV, wrist fracture  are also affecting patient's functional outcome.   REHAB POTENTIAL: Fair    CLINICAL DECISION MAKING: Evolving/moderate complexity  EVALUATION COMPLEXITY: Moderate  PLAN: PT FREQUENCY: 2x/week  PT DURATION: 12 weeks  PLANNED INTERVENTIONS: Therapeutic exercises, Therapeutic activity, Neuromuscular re-education, Balance training, Gait training, Patient/Family education, Self Care, Joint mobilization, Stair training, Vestibular training, Canalith repositioning, Visual/preceptual remediation/compensation, Orthotic/Fit training, DME instructions, Dry Needling, Electrical stimulation, Wheelchair mobility training, Spinal mobilization, Cryotherapy, Moist heat, Taping, Vasopneumatic device, Traction, Ultrasound, Ionotophoresis 4mg /ml Dexamethasone, Manual therapy, and Re-evaluation  PLAN FOR NEXT SESSION: standing balance and strengthening    , PT 03/09/2022, 11:01 AM

## 2022-03-09 ENCOUNTER — Ambulatory Visit: Payer: PPO | Attending: Neurology

## 2022-03-09 DIAGNOSIS — R2681 Unsteadiness on feet: Secondary | ICD-10-CM | POA: Diagnosis not present

## 2022-03-09 DIAGNOSIS — R262 Difficulty in walking, not elsewhere classified: Secondary | ICD-10-CM | POA: Diagnosis not present

## 2022-03-09 DIAGNOSIS — R278 Other lack of coordination: Secondary | ICD-10-CM | POA: Insufficient documentation

## 2022-03-09 DIAGNOSIS — M6281 Muscle weakness (generalized): Secondary | ICD-10-CM

## 2022-03-09 DIAGNOSIS — R269 Unspecified abnormalities of gait and mobility: Secondary | ICD-10-CM | POA: Diagnosis not present

## 2022-03-09 DIAGNOSIS — G35 Multiple sclerosis: Secondary | ICD-10-CM | POA: Diagnosis not present

## 2022-03-13 NOTE — Therapy (Signed)
OUTPATIENT PHYSICAL THERAPY NEURO TREATMENT   Patient Name: Lisa Williams MRN: [id number] DOB:June 20, 1960, 61 y.o., female Today's Date: 03/14/2022   PCP: Lauro Regulus  REFERRING PROVIDER: Asa Lente MD    PT End of Session - 03/14/22 0951     Visit Number 9    Number of Visits 24    Date for PT Re-Evaluation 05/09/22    Authorization Type 9/10: eval 10/10    PT Start Time 1015    PT Stop Time 1059    PT Time Calculation (min) 44 min    Equipment Utilized During Treatment Gait belt    Activity Tolerance Patient tolerated treatment well    Behavior During Therapy WFL for tasks assessed/performed                    Past Medical History:  Diagnosis Date   Abdominal pain, right upper quadrant    Back pain    Calculus of kidney 12/09/2013   Chronic back pain    unspecified   Chronic left shoulder pain 07/19/2015   Complication of anesthesia    Functional disorder of bladder    other   Galactorrhea 11/26/2014   Chronic    Hereditary and idiopathic neuropathy 08/19/2013   HPV test positive    Hypercholesteremia 08/19/2013   Hypertension    Incomplete bladder emptying    Microscopic hematuria    MS (multiple sclerosis) (HCC)    Muscle spasticity 05/21/2014   Nonspecific findings on examination of urine    other   Osteopenia    PONV (postoperative nausea and vomiting)    Status post laparoscopic supracervical hysterectomy 11/26/2014   Tobacco user 11/26/2014   Wrist fracture    Past Surgical History:  Procedure Laterality Date   bilateral tubal ligation  1996   BREAST CYST EXCISION Left 2002   FRACTURE SURGERY     KNEE SURGERY     right   LAPAROSCOPIC SUPRACERVICAL HYSTERECTOMY  08/05/2013   ORIF WRIST FRACTURE Left 01/17/2017   Procedure: OPEN REDUCTION INTERNAL FIXATION (ORIF) WRIST FRACTURE;  Surgeon: Lyndle Herrlich, MD;  Location: [facility];  Service: Orthopedics;  Laterality: Left;   RADIOLOGY WITH ANESTHESIA N/A 03/18/2020    Procedure: MRI WITH ANESTHESIA CERVICAL SPINE AND BRAIN  WITH AND WITHOUT CONTRAST;  Surgeon: Radiologist, Medication, MD;  Location: [facility];  Service: Radiology;  Laterality: N/A;   TUBAL LIGATION Bilateral    VAGINAL HYSTERECTOMY  03/2006   Patient Active Problem List   Diagnosis Date Noted   Abscess of left groin 11/15/2021   Abnormal LFTs 11/15/2021   Acute respiratory disease due to COVID-19 virus 11/21/2020   Weakness    Hypoalbuminemia due to protein-calorie malnutrition (HCC)    Neurogenic bowel    Neurogenic bladder    Labile blood pressure    Neuropathic pain    Abscess of female pelvis    SVT (supraventricular tachycardia)    Radial styloid tenosynovitis 03/12/2018   Wheelchair confinement 02/27/2018   Localized osteoporosis with current pathological fracture with routine healing 01/19/2017   Wrist fracture 01/16/2017   Sprain of ankle 03/23/2016   Closed fracture of lateral malleolus 03/16/2016   Health care maintenance 01/24/2016   Blood pressure elevated without history of HTN 10/25/2015   Essential hypertension 10/25/2015   Multiple sclerosis (HCC) 10/02/2015   Chronic left shoulder pain 07/19/2015   Multiple sclerosis exacerbation (HCC) 07/14/2015   MS (multiple sclerosis) (HCC) 11/26/2014   Increased body mass index 11/26/2014  HPV test positive 11/26/2014   Status post laparoscopic supracervical hysterectomy 11/26/2014   Galactorrhea 11/26/2014   Back ache 05/21/2014   Adiposity 05/21/2014   Disordered sleep 05/21/2014   Muscle spasticity 05/21/2014   Spasticity 05/21/2014   Calculus of kidney 12/09/2013   Renal colic 12/09/2013   Hypercholesteremia 08/19/2013   Hereditary and idiopathic neuropathy 08/19/2013   Hypercholesterolemia without hypertriglyceridemia 08/19/2013   Bladder infection, chronic 07/25/2012   Disorder of bladder function 07/25/2012   Incomplete bladder emptying 07/25/2012   Microscopic hematuria 07/25/2012   Right upper quadrant  pain 07/25/2012    ONSET DATE: 1995  REFERRING DIAG: MS  THERAPY DIAG:  Difficulty in walking, not elsewhere classified  Muscle weakness (generalized)  Unsteadiness on feet  Abnormality of gait and mobility  Rationale for Evaluation and Treatment Rehabilitation  SUBJECTIVE:                                                                                                                                                                                              SUBJECTIVE STATEMENT: Patient reports having a good weekend. Is using the transport van again.   Pt accompanied by: self  PERTINENT HISTORY: Patient is retuning to PT after hospitalization on 11/14/21 where she then went to SNF and then received home health therapy which stopped 02/08/22. Patient has weakness in BLE with RLE>LLE. She drives with hand controls. Patient has been diagnosed with MS in 1995.  PMH includes: back pain, CBP, chronic L shoulder pain, galactorrhea, neuropathy, HPV, hypercholestermeia, HTN, MS, osteopenia, PONV, wrist fracture. Started back driving about two weeks ago.   PAIN:  Are you having pain? Yes: NPRS scale: 2/10 Pain location: abdomen to R leg Pain description: spasm Aggravating factors: unknown Relieving factors: n/a  PRECAUTIONS: Fall  WEIGHT BEARING RESTRICTIONS No  FALLS: Has patient fallen in last 6 months? No  LIVING ENVIRONMENT: Lives with: lives with their family Lives in: House/apartment Stairs: Yes: Internal: full flight steps; but lives fully on first floor Has following equipment at home: Dan Humphreys - 2 wheeled, Wheelchair (manual), shower chair, Grab bars, and Ramped entry  PLOF: Independent with household mobility with device  PATIENT GOALS be able to get into the bed without assistance, improve ambulation, standing tolerance, going up steps.   OBJECTIVE:   DIAGNOSTIC FINDINGS: MRI of the brain 03/18/2020 showed multiple T2/FLAIR hyperintense foci in the periventricular,  juxtacortical and deep white matter.  There were no infratentorial lesions noted.  None of the foci enhanced.   Due to severe claustrophobia, the study was done with conscious sedation in the hospital  MRI of the cervical spine  03/18/2020 showed T2 hyperintense foci at C1-C2, C2-C3, C4-C5, C5-C6 and T1.  There is a disc protrusion at C5-C6 to the left and a larger disc protrusion at C6-C7 to the right that distorts the thecal sac.  The adjacent spinal cord has normal signal.  There could be compression of the right C7 nerve root.       LOWER EXTREMITY MMT:    MMT Right Eval Left Eval  Hip flexion 2- 2  Hip extension 2- 2  Hip abduction 2+ 2+  Hip adduction 2+ 2+  Knee flexion 2- 2  Knee extension 2 2  Ankle dorsiflexion trace trace  Ankle plantarflexion trace trace  (Blank rows = not tested)    TODAY'S TREATMENT:   In // bars: -standing weight shifts with focus on LE shift rather than upper body -standing opposite shoulder hand taps 10x -macarena hand motions in standing x 2 full coordination/choreo.   Seated: On dynadisc: -seated boxing cross body 10x for coordination, muscle stabilization and recruitment patterning.  -seated cross x2 then duck x10 circuit -seated cross then upper cut x10  Seated balloon boxing taps x2 minutes   Seated tricep press 10x; 2 sets for 20x total RTB abduction 10x RTB adduction 10x Overhead Y hold for 10 seconds x 5 reps   PATIENT EDUCATION: Education details: goals, POC, outcome measures Person educated: Patient Education method: Explanation, Demonstration, Tactile cues, and Verbal cues Education comprehension: verbalized understanding, returned demonstration, verbal cues required, and tactile cues required   HOME EXERCISE PROGRAM: Give next session    GOALS: Goals reviewed with patient? Yes  SHORT TERM GOALS: Target date: 03/28/2022  Patient will be independent in home exercise program to improve strength/mobility for better  functional independence with ADLs. Baseline: Goal status: INITIAL    LONG TERM GOALS: Target date: 05/09/2022  Patient will increase FOTO score to equal to or greater than  52%   to demonstrate statistically significant improvement in mobility and quality of life Baseline: 10/10: 42% Goal status: INITIAL  2.  Patient (> 68 years old) will complete five times sit to stand test in < 15 seconds indicating an increased LE strength and improved balance. Baseline: 10/10: 38 seconds with one plop heavy UE support Goal status: INITIAL  3.  Patient will increase 10 meter walk test to >0.5 m/s as to improve gait speed for better community ambulation and to reduce fall risk. Baseline: 10/10: 54 seconds with bRW andw/c follow Goal status: INITIAL  4.  Patient will be able to get into bed independently with use of railing to increase independence with mobility and nighttime routine.  Baseline: 10/10: needs complete assistance to bring RLE into bed.  Goal status: INITIAL  5.  Patient will go up 2 small steps to enter sisters house with min A.  Baseline: 10/10: unable to perform  Goal status: INITIAL   ASSESSMENT:  CLINICAL IMPRESSION: Patient presents with excellent motivation. She is able to tolerate progressive stabilization interventions in standing without LOB.  Her core stabilization is improving with boxing on unstable surfaces. Patient is challenged with prolonged hold muscle contractions. Patient educated that next session will be a progress note. Patient will benefit from skilled physical therapy to increase strength, mobility, and stability for decreased fall risk and improved quality of life.    OBJECTIVE IMPAIRMENTS Abnormal gait, decreased activity tolerance, decreased balance, decreased coordination, decreased endurance, decreased mobility, difficulty walking, decreased ROM, decreased strength, hypomobility, increased edema, impaired perceived functional ability, increased muscle  spasms, impaired  flexibility, impaired tone, impaired UE functional use, improper body mechanics, postural dysfunction, obesity, and pain.   ACTIVITY LIMITATIONS carrying, lifting, bending, standing, squatting, sleeping, stairs, transfers, bed mobility, bathing, toileting, dressing, reach over head, locomotion level, and caring for others  PARTICIPATION LIMITATIONS: meal prep, cleaning, laundry, shopping, community activity, and yard work  PERSONAL FACTORS Age, Fitness, Past/current experiences, Sex, Social background, Time since onset of injury/illness/exacerbation, Transportation, and 3+ comorbidities: back pain, CBP, chronic L shoulder pain, galactorrhea, neuropathy, HPV, hypercholestermeia, HTN, MS, osteopenia, PONV, wrist fracture  are also affecting patient's functional outcome.   REHAB POTENTIAL: Fair    CLINICAL DECISION MAKING: Evolving/moderate complexity  EVALUATION COMPLEXITY: Moderate  PLAN: PT FREQUENCY: 2x/week  PT DURATION: 12 weeks  PLANNED INTERVENTIONS: Therapeutic exercises, Therapeutic activity, Neuromuscular re-education, Balance training, Gait training, Patient/Family education, Self Care, Joint mobilization, Stair training, Vestibular training, Canalith repositioning, Visual/preceptual remediation/compensation, Orthotic/Fit training, DME instructions, Dry Needling, Electrical stimulation, Wheelchair mobility training, Spinal mobilization, Cryotherapy, Moist heat, Taping, Vasopneumatic device, Traction, Ultrasound, Ionotophoresis 4mg /ml Dexamethasone, Manual therapy, and Re-evaluation  PLAN FOR NEXT SESSION: standing balance and strengthening    Janna Arch, PT 03/14/2022, 12:29 PM

## 2022-03-14 ENCOUNTER — Ambulatory Visit: Payer: PPO

## 2022-03-14 DIAGNOSIS — R262 Difficulty in walking, not elsewhere classified: Secondary | ICD-10-CM | POA: Diagnosis not present

## 2022-03-14 DIAGNOSIS — R269 Unspecified abnormalities of gait and mobility: Secondary | ICD-10-CM

## 2022-03-14 DIAGNOSIS — R2681 Unsteadiness on feet: Secondary | ICD-10-CM

## 2022-03-14 DIAGNOSIS — M6281 Muscle weakness (generalized): Secondary | ICD-10-CM

## 2022-03-15 NOTE — Therapy (Signed)
OUTPATIENT PHYSICAL THERAPY NEURO TREATMENT/ Physical Therapy Progress Note   Dates of reporting period  02/14/22   to   03/16/22    Patient Name: Lisa Williams MRN: [id number] DOB:11/25/1960, 61 y.o., female Today's Date: 03/16/2022   PCP: Anderson, Marshall W  REFERRING PROVIDER: Sater, Richard A MD    PT End of Session - 03/16/22 0950     Visit Number 10    Number of Visits 24    Date for PT Re-Evaluation 05/09/22    Authorization Type next session 1/10 PN 03/16/22    PT Start Time 1015    PT Stop Time 1059    PT Time Calculation (min) 44 min    Equipment Utilized During Treatment Gait belt    Activity Tolerance Patient tolerated treatment well    Behavior During Therapy WFL for tasks assessed/performed                     Past Medical History:  Diagnosis Date   Abdominal pain, right upper quadrant    Back pain    Calculus of kidney 12/09/2013   Chronic back pain    unspecified   Chronic left shoulder pain 07/19/2015   Complication of anesthesia    Functional disorder of bladder    other   Galactorrhea 11/26/2014   Chronic    Hereditary and idiopathic neuropathy 08/19/2013   HPV test positive    Hypercholesteremia 08/19/2013   Hypertension    Incomplete bladder emptying    Microscopic hematuria    MS (multiple sclerosis) (HCC)    Muscle spasticity 05/21/2014   Nonspecific findings on examination of urine    other   Osteopenia    PONV (postoperative nausea and vomiting)    Status post laparoscopic supracervical hysterectomy 11/26/2014   Tobacco user 11/26/2014   Wrist fracture    Past Surgical History:  Procedure Laterality Date   bilateral tubal ligation  1996   BREAST CYST EXCISION Left 2002   FRACTURE SURGERY     KNEE SURGERY     right   LAPAROSCOPIC SUPRACERVICAL HYSTERECTOMY  08/05/2013   ORIF WRIST FRACTURE Left 01/17/2017   Procedure: OPEN REDUCTION INTERNAL FIXATION (ORIF) WRIST FRACTURE;  Surgeon: Bowers, James R, MD;  Location:  [facility];  Service: Orthopedics;  Laterality: Left;   RADIOLOGY WITH ANESTHESIA N/A 03/18/2020   Procedure: MRI WITH ANESTHESIA CERVICAL SPINE AND BRAIN  WITH AND WITHOUT CONTRAST;  Surgeon: Radiologist, Medication, MD;  Location: [facility];  Service: Radiology;  Laterality: N/A;   TUBAL LIGATION Bilateral    VAGINAL HYSTERECTOMY  03/2006   Patient Active Problem List   Diagnosis Date Noted   Abscess of left groin 11/15/2021   Abnormal LFTs 11/15/2021   Acute respiratory disease due to COVID-19 virus 11/21/2020   Weakness    Hypoalbuminemia due to protein-calorie malnutrition (HCC)    Neurogenic bowel    Neurogenic bladder    Labile blood pressure    Neuropathic pain    Abscess of female pelvis    SVT (supraventricular tachycardia)    Radial styloid tenosynovitis 03/12/2018   Wheelchair confinement 02/27/2018   Localized osteoporosis with current pathological fracture with routine healing 01/19/2017   Wrist fracture 01/16/2017   Sprain of ankle 03/23/2016   Closed fracture of lateral malleolus 03/16/2016   Health care maintenance 01/24/2016   Blood pressure elevated without history of HTN 10/25/2015   Essential hypertension 10/25/2015   Multiple sclerosis (HCC) 10/02/2015   Chronic left shoulder pain   07/19/2015   Multiple sclerosis exacerbation (HCC) 07/14/2015   MS (multiple sclerosis) (HCC) 11/26/2014   Increased body mass index 11/26/2014   HPV test positive 11/26/2014   Status post laparoscopic supracervical hysterectomy 11/26/2014   Galactorrhea 11/26/2014   Back ache 05/21/2014   Adiposity 05/21/2014   Disordered sleep 05/21/2014   Muscle spasticity 05/21/2014   Spasticity 05/21/2014   Calculus of kidney 12/09/2013   Renal colic 12/09/2013   Hypercholesteremia 08/19/2013   Hereditary and idiopathic neuropathy 08/19/2013   Hypercholesterolemia without hypertriglyceridemia 08/19/2013   Bladder infection, chronic 07/25/2012   Disorder of bladder function 07/25/2012    Incomplete bladder emptying 07/25/2012   Microscopic hematuria 07/25/2012   Right upper quadrant pain 07/25/2012    ONSET DATE: 1995  REFERRING DIAG: MS  THERAPY DIAG:  Difficulty in walking, not elsewhere classified  Muscle weakness (generalized)  Unsteadiness on feet  Abnormality of gait and mobility  Rationale for Evaluation and Treatment Rehabilitation  SUBJECTIVE:                                                                                                                                                                                              SUBJECTIVE STATEMENT: Patient is aware today is a progress note  Pt accompanied by: self  PERTINENT HISTORY: Patient is retuning to PT after hospitalization on 11/14/21 where she then went to SNF and then received home health therapy which stopped 02/08/22. Patient has weakness in BLE with RLE>LLE. She drives with hand controls. Patient has been diagnosed with MS in 1995.  PMH includes: back pain, CBP, chronic L shoulder pain, galactorrhea, neuropathy, HPV, hypercholestermeia, HTN, MS, osteopenia, PONV, wrist fracture. Started back driving about two weeks ago.   PAIN:  Are you having pain? Yes: NPRS scale: 2/10 Pain location: abdomen to R leg Pain description: spasm Aggravating factors: unknown Relieving factors: n/a  PRECAUTIONS: Fall  WEIGHT BEARING RESTRICTIONS No  FALLS: Has patient fallen in last 6 months? No  LIVING ENVIRONMENT: Lives with: lives with their family Lives in: House/apartment Stairs: Yes: Internal: full flight steps; but lives fully on first floor Has following equipment at home: Walker - 2 wheeled, Wheelchair (manual), shower chair, Grab bars, and Ramped entry  PLOF: Independent with household mobility with device  PATIENT GOALS be able to get into the bed without assistance, improve ambulation, standing tolerance, going up steps.   OBJECTIVE:   DIAGNOSTIC FINDINGS: MRI of the brain 03/18/2020  showed multiple T2/FLAIR hyperintense foci in the periventricular, juxtacortical and deep white matter.  There were no infratentorial lesions noted.  None of the foci enhanced.   Due   to severe claustrophobia, the study was done with conscious sedation in the hospital  MRI of the cervical spine 03/18/2020 showed T2 hyperintense foci at C1-C2, C2-C3, C4-C5, C5-C6 and T1.  There is a disc protrusion at C5-C6 to the left and a larger disc protrusion at C6-C7 to the right that distorts the thecal sac.  The adjacent spinal cord has normal signal.  There could be compression of the right C7 nerve root.       LOWER EXTREMITY MMT:    MMT Right Eval Left Eval  Hip flexion 2- 2  Hip extension 2- 2  Hip abduction 2+ 2+  Hip adduction 2+ 2+  Knee flexion 2- 2  Knee extension 2 2  Ankle dorsiflexion trace trace  Ankle plantarflexion trace trace  (Blank rows = not tested)    TODAY'S TREATMENT:  Goals performed: See below for details  In // bars: Standing patty cake x2 minutes with intermittent rest breaks   Seated:  2lb weights:  -forward anterior delt raise 10x -lateral delt raise 15x -bicep curl to overhead press 10x  -green/red light for chest press and hold x 1 minute  Isometric contraction 10x 5 second holds into PT hand: flexion, abduction, adduction Hamstring lengthening stretch 60 seconds each LE   PATIENT EDUCATION: Education details: goals, POC, outcome measures Person educated: Patient Education method: Explanation, Demonstration, Tactile cues, and Verbal cues Education comprehension: verbalized understanding, returned demonstration, verbal cues required, and tactile cues required   HOME EXERCISE PROGRAM: Give next session    GOALS: Goals reviewed with patient? Yes  SHORT TERM GOALS: Target date: 03/28/2022  Patient will be independent in home exercise program to improve strength/mobility for better functional independence with ADLs. Baseline:11/9:  compliant Goal status: Partially Met    LONG TERM GOALS: Target date: 05/09/2022  Patient will increase FOTO score to equal to or greater than  52%   to demonstrate statistically significant improvement in mobility and quality of life Baseline: 10/10: 42% 11/9: 40% Goal status: in progress  2.  Patient (> 31 years old) will complete five times sit to stand test in < 15 seconds indicating an increased LE strength and improved balance. Baseline: 10/10: 38 seconds with one plop heavy UE support 11/9: 26 seconds with heavy BUE support  Goal status: In progress  3.  Patient will increase 10 meter walk test to >0.5 m/s as to improve gait speed for better community ambulation and to reduce fall risk. Baseline: 10/10: 54 seconds with bRW andw/c follow 11/9: 43.3 seconds with RW and w/c follow  Goal status: In progress  4.  Patient will be able to get into bed independently with use of railing to increase independence with mobility and nighttime routine.  Baseline: 10/10: needs complete assistance to bring RLE into bed. 11/9: needs assistance with leg;  Goal status: In progress  5.  Patient will go up 2 small steps to enter sisters house with min A.  Baseline: 10/10: unable to perform 11/9: unable to perform on 2 " step   Goal status: IN PROGRESS   ASSESSMENT:  CLINICAL IMPRESSION: Patient has made significant progress towards functional 10 MWT and 5x STS goals. She continues to be limited by step and bringing foot up on bed.  Patient's condition has the potential to improve in response to therapy. Maximum improvement is yet to be obtained. The anticipated improvement is attainable and reasonable in a generally predictable time.   Patient will benefit from skilled physical therapy to increase  strength, mobility, and stability for decreased fall risk and improved quality of life.    OBJECTIVE IMPAIRMENTS Abnormal gait, decreased activity tolerance, decreased balance, decreased coordination,  decreased endurance, decreased mobility, difficulty walking, decreased ROM, decreased strength, hypomobility, increased edema, impaired perceived functional ability, increased muscle spasms, impaired flexibility, impaired tone, impaired UE functional use, improper body mechanics, postural dysfunction, obesity, and pain.   ACTIVITY LIMITATIONS carrying, lifting, bending, standing, squatting, sleeping, stairs, transfers, bed mobility, bathing, toileting, dressing, reach over head, locomotion level, and caring for others  PARTICIPATION LIMITATIONS: meal prep, cleaning, laundry, shopping, community activity, and yard work  PERSONAL FACTORS Age, Fitness, Past/current experiences, Sex, Social background, Time since onset of injury/illness/exacerbation, Transportation, and 3+ comorbidities: back pain, CBP, chronic L shoulder pain, galactorrhea, neuropathy, HPV, hypercholestermeia, HTN, MS, osteopenia, PONV, wrist fracture  are also affecting patient's functional outcome.   REHAB POTENTIAL: Fair    CLINICAL DECISION MAKING: Evolving/moderate complexity  EVALUATION COMPLEXITY: Moderate  PLAN: PT FREQUENCY: 2x/week  PT DURATION: 12 weeks  PLANNED INTERVENTIONS: Therapeutic exercises, Therapeutic activity, Neuromuscular re-education, Balance training, Gait training, Patient/Family education, Self Care, Joint mobilization, Stair training, Vestibular training, Canalith repositioning, Visual/preceptual remediation/compensation, Orthotic/Fit training, DME instructions, Dry Needling, Electrical stimulation, Wheelchair mobility training, Spinal mobilization, Cryotherapy, Moist heat, Taping, Vasopneumatic device, Traction, Ultrasound, Ionotophoresis 4mg/ml Dexamethasone, Manual therapy, and Re-evaluation  PLAN FOR NEXT SESSION: standing balance and strengthening     , PT 03/16/2022, 11:07 AM        

## 2022-03-16 ENCOUNTER — Ambulatory Visit: Payer: PPO

## 2022-03-16 DIAGNOSIS — M6281 Muscle weakness (generalized): Secondary | ICD-10-CM

## 2022-03-16 DIAGNOSIS — R262 Difficulty in walking, not elsewhere classified: Secondary | ICD-10-CM

## 2022-03-16 DIAGNOSIS — R269 Unspecified abnormalities of gait and mobility: Secondary | ICD-10-CM

## 2022-03-16 DIAGNOSIS — R2681 Unsteadiness on feet: Secondary | ICD-10-CM

## 2022-03-21 ENCOUNTER — Ambulatory Visit: Payer: PPO

## 2022-03-21 DIAGNOSIS — R262 Difficulty in walking, not elsewhere classified: Secondary | ICD-10-CM | POA: Diagnosis not present

## 2022-03-21 DIAGNOSIS — R269 Unspecified abnormalities of gait and mobility: Secondary | ICD-10-CM

## 2022-03-21 DIAGNOSIS — M6281 Muscle weakness (generalized): Secondary | ICD-10-CM

## 2022-03-21 NOTE — Therapy (Signed)
OUTPATIENT PHYSICAL THERAPY NEURO TREATMENT  Patient Name: Lisa Williams MRN: [id number] DOB:29-Jan-1961, 61 y.o., female Today's Date: 03/21/2022  PCP: Kirk Ruths  REFERRING PROVIDER: Britt Bottom MD    PT End of Session - 03/21/22 1013     Visit Number 11    Number of Visits 24    Date for PT Re-Evaluation 05/09/22    Authorization Type next session 1/10 PN 03/16/22    PT Start Time 1015    PT Stop Time 1100    PT Time Calculation (min) 45 min    Equipment Utilized During Treatment Gait belt    Activity Tolerance Patient tolerated treatment well    Behavior During Therapy [facility] for tasks assessed/performed            Past Medical History:  Diagnosis Date   Abdominal pain, right upper quadrant    Back pain    Calculus of kidney 12/09/2013   Chronic back pain    unspecified   Chronic left shoulder pain 5/53/7482   Complication of anesthesia    Functional disorder of bladder    other   Galactorrhea 11/26/2014   Chronic    Hereditary and idiopathic neuropathy 08/19/2013   HPV test positive    Hypercholesteremia 08/19/2013   Hypertension    Incomplete bladder emptying    Microscopic hematuria    MS (multiple sclerosis) (HCC)    Muscle spasticity 05/21/2014   Nonspecific findings on examination of urine    other   Osteopenia    PONV (postoperative nausea and vomiting)    Status post laparoscopic supracervical hysterectomy 11/26/2014   Tobacco user 11/26/2014   Wrist fracture    Past Surgical History:  Procedure Laterality Date   bilateral tubal ligation  1996   BREAST CYST EXCISION Left 2002   FRACTURE SURGERY     KNEE SURGERY     right   LAPAROSCOPIC SUPRACERVICAL HYSTERECTOMY  08/05/2013   ORIF WRIST FRACTURE Left 01/17/2017   Procedure: OPEN REDUCTION INTERNAL FIXATION (ORIF) WRIST FRACTURE;  Surgeon: Lovell Sheehan, MD;  Location: [facility];  Service: Orthopedics;  Laterality: Left;   RADIOLOGY WITH ANESTHESIA N/A 03/18/2020   Procedure:  MRI WITH ANESTHESIA CERVICAL SPINE AND BRAIN  WITH AND WITHOUT CONTRAST;  Surgeon: Radiologist, Medication, MD;  Location: [facility];  Service: Radiology;  Laterality: N/A;   TUBAL LIGATION Bilateral    VAGINAL HYSTERECTOMY  03/2006   Patient Active Problem List   Diagnosis Date Noted   Abscess of left groin 11/15/2021   Abnormal LFTs 11/15/2021   Acute respiratory disease due to COVID-19 virus 11/21/2020   Weakness    Hypoalbuminemia due to protein-calorie malnutrition [facility])    Neurogenic bowel    Neurogenic bladder    Labile blood pressure    Neuropathic pain    Abscess of female pelvis    SVT (supraventricular tachycardia)    Radial styloid tenosynovitis 03/12/2018   Wheelchair confinement 02/27/2018   Localized osteoporosis with current pathological fracture with routine healing 01/19/2017   Wrist fracture 01/16/2017   Sprain of ankle 03/23/2016   Closed fracture of lateral malleolus 03/16/2016   Health care maintenance 01/24/2016   Blood pressure elevated without history of HTN 10/25/2015   Essential hypertension 10/25/2015   Multiple sclerosis ([place]) 10/02/2015   Chronic left shoulder pain 07/19/2015   Multiple sclerosis exacerbation ([place]) 07/14/2015   MS (multiple sclerosis) ([place]) 11/26/2014   Increased body mass index 11/26/2014   HPV test positive 11/26/2014  Status post laparoscopic supracervical hysterectomy 11/26/2014   Galactorrhea 11/26/2014   Back ache 05/21/2014   Adiposity 05/21/2014   Disordered sleep 05/21/2014   Muscle spasticity 05/21/2014   Spasticity 05/21/2014   Calculus of kidney 82/95/6213   Renal colic 08/65/7846   Hypercholesteremia 08/19/2013   Hereditary and idiopathic neuropathy 08/19/2013   Hypercholesterolemia without hypertriglyceridemia 08/19/2013   Bladder infection, chronic 07/25/2012   Disorder of bladder function 07/25/2012   Incomplete bladder emptying 07/25/2012   Microscopic hematuria 07/25/2012   Right upper quadrant pain  07/25/2012   ONSET DATE: 1995  REFERRING DIAG: MS  THERAPY DIAG:  Difficulty in walking, not elsewhere classified  Muscle weakness (generalized)  Abnormality of gait and mobility  Rationale for Evaluation and Treatment Rehabilitation  SUBJECTIVE:                                                                                                                                                                                              SUBJECTIVE STATEMENT:  Patient presented to therapy reporting no changes or falls since last visit.   Pt accompanied by: self  PERTINENT HISTORY: Patient is retuning to PT after hospitalization on 11/14/21 where she then went to SNF and then received home health therapy which stopped 02/08/22. Patient has weakness in BLE with RLE>LLE. She drives with hand controls. Patient has been diagnosed with MS in 1995.  PMH includes: back pain, CBP, chronic L shoulder pain, galactorrhea, neuropathy, HPV, hypercholestermeia, HTN, MS, osteopenia, PONV, wrist fracture. Started back driving about two weeks ago.   PAIN:  Are you having pain? Yes: NPRS scale: 2/10 Pain location: abdomen to R leg Pain description: spasm Aggravating factors: unknown Relieving factors: n/a  PRECAUTIONS: Fall  WEIGHT BEARING RESTRICTIONS No  FALLS: Has patient fallen in last 6 months? No  LIVING ENVIRONMENT: Lives with: lives with their family Lives in: House/apartment Stairs: Yes: Internal: full flight steps; but lives fully on first floor Has following equipment at home: Gilford Rile - 2 wheeled, Wheelchair (manual), shower chair, Grab bars, and Ramped entry  PLOF: Independent with household mobility with device  PATIENT GOALS be able to get into the bed without assistance, improve ambulation, standing tolerance, going up steps.   OBJECTIVE:   DIAGNOSTIC FINDINGS: MRI of the brain 03/18/2020 showed multiple T2/FLAIR hyperintense foci in the periventricular, juxtacortical and deep  white matter.  There were no infratentorial lesions noted.  None of the foci enhanced.   Due to severe claustrophobia, the study was done with conscious sedation in the hospital  MRI of the cervical spine 03/18/2020 showed T2 hyperintense foci at C1-C2, C2-C3, C4-C5, C5-C6  and T1.  There is a disc protrusion at C5-C6 to the left and a larger disc protrusion at C6-C7 to the right that distorts the thecal sac.  The adjacent spinal cord has normal signal.  There could be compression of the right C7 nerve root.   LOWER EXTREMITY MMT:    MMT Right Eval Left Eval  Hip flexion 2- 2  Hip extension 2- 2  Hip abduction 2+ 2+  Hip adduction 2+ 2+  Knee flexion 2- 2  Knee extension 2 2  Ankle dorsiflexion trace trace  Ankle plantarflexion trace trace  (Blank rows = not tested)   TODAY'S TREATMENT:   There Ex:  Ambulation on Incline: Patient ambulated with a bariatric RW, CGA with close w/c follow.   1x ambulation up incline (1 rest break required 3/4 way up incline)  1x ambulation down incline (able to make it 1/4 way down incline)  1x 20 seconds standing tolerance on incline facing decline   Seated:  - RTB hip abduction 10x - RTB hip adduction 10x  Neuro Rehab:  Seated on Dyna Disc:   - BTB row 10x  - BTB Pallof Press 5x (B)  - hand clap for pertubation and reaction timing x2 minutes - hand slap game for reaction timing and motor control x2 minutes  - Hamstring lengthening stretch 10x 5 seconds each LE, leg elevated on Pts leg     PATIENT EDUCATION: Education details: goals, POC, outcome measures Person educated: Patient Education method: Explanation, Demonstration, Tactile cues, and Verbal cues Education comprehension: verbalized understanding, returned demonstration, verbal cues required, and tactile cues required   HOME EXERCISE PROGRAM: Give next session  GOALS: Goals reviewed with patient? Yes  SHORT TERM GOALS: Target date: 03/28/2022  Patient will be independent  in home exercise program to improve strength/mobility for better functional independence with ADLs. Baseline:11/9: compliant Goal status: Partially Met    LONG TERM GOALS: Target date: 05/09/2022  Patient will increase FOTO score to equal to or greater than  52%   to demonstrate statistically significant improvement in mobility and quality of life Baseline: 10/10: 42% 11/9: 40% Goal status: in progress  2.  Patient (> 60 years old) will complete five times sit to stand test in < 15 seconds indicating an increased LE strength and improved balance. Baseline: 10/10: 38 seconds with one plop heavy UE support 11/9: 26 seconds with heavy BUE support  Goal status: In progress  3.  Patient will increase 10 meter walk test to >0.5 m/s as to improve gait speed for better community ambulation and to reduce fall risk. Baseline: 10/10: 54 seconds with bRW andw/c follow 11/9: 43.3 seconds with RW and w/c follow  Goal status: In progress  4.  Patient will be able to get into bed independently with use of railing to increase independence with mobility and nighttime routine.  Baseline: 10/10: needs complete assistance to bring RLE into bed. 11/9: needs assistance with leg;  Goal status: In progress  5.  Patient will go up 2 small steps to enter sisters house with min A.  Baseline: 10/10: unable to perform 11/9: unable to perform on 2 " step   Goal status: IN PROGRESS   ASSESSMENT:  CLINICAL IMPRESSION:  The patient continues to present to therapy with excellent motivation and drive to improve every session. The patient was able to ambulate up an incline with a bariatric RW, CGA with close w/c follow. The patient required one seated rest break on the way up  the incline. Overall, the patient demonstrated good ability to stand erect and keep LEs advancing while ambulating up incline. The patient had decreased hip swing and decreased step length/knee flexion walking up the incline compared to on flat  ground but this is expected with the increased difficulty of navigating inclines. The patient had increased difficulty navigating the decline and was only able to make it 1/4 of the way down the decline. The patient will benefit from continuing to work on ambulating down a decline moving forward. Following ambulation the patient performed amazing with seated exercises and functional tasks while maintaining upright balance while seated on an unsteady surface.   OBJECTIVE IMPAIRMENTS Abnormal gait, decreased activity tolerance, decreased balance, decreased coordination, decreased endurance, decreased mobility, difficulty walking, decreased ROM, decreased strength, hypomobility, increased edema, impaired perceived functional ability, increased muscle spasms, impaired flexibility, impaired tone, impaired UE functional use, improper body mechanics, postural dysfunction, obesity, and pain.   ACTIVITY LIMITATIONS carrying, lifting, bending, standing, squatting, sleeping, stairs, transfers, bed mobility, bathing, toileting, dressing, reach over head, locomotion level, and caring for others  PARTICIPATION LIMITATIONS: meal prep, cleaning, laundry, shopping, community activity, and yard work  PERSONAL FACTORS Age, Fitness, Past/current experiences, Sex, Social background, Time since onset of injury/illness/exacerbation, Transportation, and 3+ comorbidities: back pain, CBP, chronic L shoulder pain, galactorrhea, neuropathy, HPV, hypercholestermeia, HTN, MS, osteopenia, PONV, wrist fracture  are also affecting patient's functional outcome.   REHAB POTENTIAL: Fair    CLINICAL DECISION MAKING: Evolving/moderate complexity  EVALUATION COMPLEXITY: Moderate  PLAN: PT FREQUENCY: 2x/week  PT DURATION: 12 weeks  PLANNED INTERVENTIONS: Therapeutic exercises, Therapeutic activity, Neuromuscular re-education, Balance training, Gait training, Patient/Family education, Self Care, Joint mobilization, Stair training,  Vestibular training, Canalith repositioning, Visual/preceptual remediation/compensation, Orthotic/Fit training, DME instructions, Dry Needling, Electrical stimulation, Wheelchair mobility training, Spinal mobilization, Cryotherapy, Moist heat, Taping, Vasopneumatic device, Traction, Ultrasound, Ionotophoresis 18m/ml Dexamethasone, Manual therapy, and Re-evaluation  PLAN FOR NEXT SESSION: standing balance and strengthening, work on ambulation of decline and standing balance with decline.    PSudie Bailey SPT  This entire session was performed under direct supervision and direction of a licensed therapist/therapist assistant . I have personally read, edited and approve of the note as written.   MJanna Arch PT 03/21/2022, 1:51 PM

## 2022-03-22 ENCOUNTER — Other Ambulatory Visit: Payer: Self-pay | Admitting: *Deleted

## 2022-03-22 DIAGNOSIS — G35 Multiple sclerosis: Secondary | ICD-10-CM

## 2022-03-22 MED ORDER — MAYZENT 2 MG PO TABS: 2.0000 mg | ORAL_TABLET | Freq: Every day | ORAL | 11 refills | Status: DC

## 2022-03-23 ENCOUNTER — Telehealth: Payer: Self-pay | Admitting: Neurology

## 2022-03-23 ENCOUNTER — Ambulatory Visit: Payer: PPO

## 2022-03-23 DIAGNOSIS — R262 Difficulty in walking, not elsewhere classified: Secondary | ICD-10-CM | POA: Diagnosis not present

## 2022-03-23 DIAGNOSIS — M6281 Muscle weakness (generalized): Secondary | ICD-10-CM

## 2022-03-23 DIAGNOSIS — R2681 Unsteadiness on feet: Secondary | ICD-10-CM

## 2022-03-23 NOTE — Telephone Encounter (Signed)
[facility] [employer] [facility]) request pt's current insurance for Siponimod Fumarate (MAYZENT) 2 MG TABS . We not able to location any insurance for the pt. Would like a call back to verify insurance.

## 2022-03-23 NOTE — Therapy (Signed)
OUTPATIENT PHYSICAL THERAPY NEURO TREATMENT  Patient Name: Santosha Jividen Mehan MRN: [id number] DOB:[birth year], 61 y.o., female Today's Date: 03/23/2022  PCP: Kirk Ruths  REFERRING PROVIDER: Britt Bottom MD    PT End of Session - 03/23/22 1013     Visit Number 12    Number of Visits 24    Date for PT Re-Evaluation 05/09/22    Authorization Type 2/10 PN 03/16/22    PT Start Time 1015    PT Stop Time 1100    PT Time Calculation (min) 45 min    Equipment Utilized During Treatment Gait belt    Activity Tolerance Patient tolerated treatment well    Behavior During Therapy [facility] for tasks assessed/performed            Past Medical History:  Diagnosis Date   Abdominal pain, right upper quadrant    Back pain    Calculus of kidney 12/09/2013   Chronic back pain    unspecified   Chronic left shoulder pain 9/92/4268   Complication of anesthesia    Functional disorder of bladder    other   Galactorrhea 11/26/2014   Chronic    Hereditary and idiopathic neuropathy 08/19/2013   HPV test positive    Hypercholesteremia 08/19/2013   Hypertension    Incomplete bladder emptying    Microscopic hematuria    MS (multiple sclerosis) (HCC)    Muscle spasticity 05/21/2014   Nonspecific findings on examination of urine    other   Osteopenia    PONV (postoperative nausea and vomiting)    Status post laparoscopic supracervical hysterectomy 11/26/2014   Tobacco user 11/26/2014   Wrist fracture    Past Surgical History:  Procedure Laterality Date   bilateral tubal ligation  1996   BREAST CYST EXCISION Left 2002   FRACTURE SURGERY     KNEE SURGERY     right   LAPAROSCOPIC SUPRACERVICAL HYSTERECTOMY  08/05/2013   ORIF WRIST FRACTURE Left 01/17/2017   Procedure: OPEN REDUCTION INTERNAL FIXATION (ORIF) WRIST FRACTURE;  Surgeon: Lovell Sheehan, MD;  Location: [facility];  Service: Orthopedics;  Laterality: Left;   RADIOLOGY WITH ANESTHESIA N/A 03/18/2020   Procedure: MRI WITH  ANESTHESIA CERVICAL SPINE AND BRAIN  WITH AND WITHOUT CONTRAST;  Surgeon: Radiologist, Medication, MD;  Location: [place];  Service: Radiology;  Laterality: N/A;   TUBAL LIGATION Bilateral    VAGINAL HYSTERECTOMY  03/2006   Patient Active Problem List   Diagnosis Date Noted   Abscess of left groin 11/15/2021   Abnormal LFTs 11/15/2021   Acute respiratory disease due to COVID-19 virus 11/21/2020   Weakness    Hypoalbuminemia due to protein-calorie malnutrition (HCC)    Neurogenic bowel    Neurogenic bladder    Labile blood pressure    Neuropathic pain    Abscess of female pelvis    SVT (supraventricular tachycardia)    Radial styloid tenosynovitis 03/12/2018   Wheelchair confinement 02/27/2018   Localized osteoporosis with current pathological fracture with routine healing 01/19/2017   Wrist fracture 01/16/2017   Sprain of ankle 03/23/2016   Closed fracture of lateral malleolus 03/16/2016   Health care maintenance 01/24/2016   Blood pressure elevated without history of HTN 10/25/2015   Essential hypertension 10/25/2015   Multiple sclerosis ([place]) 10/02/2015   Chronic left shoulder pain 07/19/2015   Multiple sclerosis exacerbation ([place]) 07/14/2015   MS (multiple sclerosis) ([place]) 11/26/2014   Increased body mass index 11/26/2014   HPV test positive 11/26/2014   Status post  laparoscopic supracervical hysterectomy 11/26/2014   Galactorrhea 11/26/2014   Back ache 05/21/2014   Adiposity 05/21/2014   Disordered sleep 05/21/2014   Muscle spasticity 05/21/2014   Spasticity 05/21/2014   Calculus of kidney 86/57/8469   Renal colic 62/95/2841   Hypercholesteremia 08/19/2013   Hereditary and idiopathic neuropathy 08/19/2013   Hypercholesterolemia without hypertriglyceridemia 08/19/2013   Bladder infection, chronic 07/25/2012   Disorder of bladder function 07/25/2012   Incomplete bladder emptying 07/25/2012   Microscopic hematuria 07/25/2012   Right upper quadrant pain 07/25/2012    ONSET DATE: 1995  REFERRING DIAG: MS  THERAPY DIAG:  Muscle weakness (generalized)  Unsteadiness on feet  Difficulty in walking, not elsewhere classified  Rationale for Evaluation and Treatment Rehabilitation  SUBJECTIVE:                                                                                                                                                                                           SUBJECTIVE STATEMENT:    The patient reports no changes since last visit and no falls. The patient reports she has been feeling stronger recently.     Pt accompanied by: self  PERTINENT HISTORY: Patient is retuning to PT after hospitalization on 11/14/21 where she then went to SNF and then received home health therapy which stopped 02/08/22. Patient has weakness in BLE with RLE>LLE. She drives with hand controls. Patient has been diagnosed with MS in 1995.  PMH includes: back pain, CBP, chronic L shoulder pain, galactorrhea, neuropathy, HPV, hypercholestermeia, HTN, MS, osteopenia, PONV, wrist fracture. Started back driving about two weeks ago.   PAIN:  Are you having pain? Yes: NPRS scale: 2/10 Pain location: abdomen to R leg Pain description: spasm Aggravating factors: unknown Relieving factors: n/a  PRECAUTIONS: Fall  WEIGHT BEARING RESTRICTIONS No  FALLS: Has patient fallen in last 6 months? No  LIVING ENVIRONMENT: Lives with: lives with their family Lives in: House/apartment Stairs: Yes: Internal: full flight steps; but lives fully on first floor Has following equipment at home: Gilford Rile - 2 wheeled, Wheelchair (manual), shower chair, Grab bars, and Ramped entry  PLOF: Independent with household mobility with device  PATIENT GOALS be able to get into the bed without assistance, improve ambulation, standing tolerance, going up steps.   OBJECTIVE:   DIAGNOSTIC FINDINGS: MRI of the brain 03/18/2020 showed multiple T2/FLAIR hyperintense foci in the periventricular,  juxtacortical and deep white matter.  There were no infratentorial lesions noted.  None of the foci enhanced.   Due to severe claustrophobia, the study was done with conscious sedation in the hospital  MRI of the cervical spine 03/18/2020 showed T2 hyperintense foci  at C1-C2, C2-C3, C4-C5, C5-C6 and T1.  There is a disc protrusion at C5-C6 to the left and a larger disc protrusion at C6-C7 to the right that distorts the thecal sac.  The adjacent spinal cord has normal signal.  There could be compression of the right C7 nerve root.   LOWER EXTREMITY MMT:    MMT Right Eval Left Eval  Hip flexion 2- 2  Hip extension 2- 2  Hip abduction 2+ 2+  Hip adduction 2+ 2+  Knee flexion 2- 2  Knee extension 2 2  Ankle dorsiflexion trace trace  Ankle plantarflexion trace trace  (Blank rows = not tested)   TODAY'S TREATMENT:  Neuro Rehab:  Speed Bag Progressions:  - Standing alternating punches 30 seconds x2 (SUE support and CGA) - Standing dual tasking punches, call out different punching combinations x2 (BUE support and CGA)   There Ex:  Seated:  - RTB hip abduction 10x - RTB hip adduction 10x  Seated Strengthening: - 5 lb bar chest press 2x10 - 5 lb bar curls x1, 9 lb bar curls x1 - 5 lb BTB wrapped around bar resisted rows x2 - BTB Pallof Press 5x (B) (Wrap BTB around D-Handle)  - Hamstring lengthening stretch 10x 5 seconds each LE, leg elevated on Pts leg  - Seated arm circumduction mobility 5x each way  - Cross body horizontal adduction stretch 5x10"    PATIENT EDUCATION: Education details: goals, POC, outcome measures Person educated: Patient Education method: Explanation, Demonstration, Tactile cues, and Verbal cues Education comprehension: verbalized understanding, returned demonstration, verbal cues required, and tactile cues required   HOME EXERCISE PROGRAM: Give next session  GOALS: Goals reviewed with patient? Yes  SHORT TERM GOALS: Target date: 03/28/2022  Patient  will be independent in home exercise program to improve strength/mobility for better functional independence with ADLs. Baseline:11/9: compliant Goal status: Partially Met    LONG TERM GOALS: Target date: 05/09/2022  Patient will increase FOTO score to equal to or greater than  52%   to demonstrate statistically significant improvement in mobility and quality of life Baseline: 10/10: 42% 11/9: 40% Goal status: in progress  2.  Patient (> 52 years old) will complete five times sit to stand test in < 15 seconds indicating an increased LE strength and improved balance. Baseline: 10/10: 38 seconds with one plop heavy UE support 11/9: 26 seconds with heavy BUE support  Goal status: In progress  3.  Patient will increase 10 meter walk test to >0.5 m/s as to improve gait speed for better community ambulation and to reduce fall risk. Baseline: 10/10: 54 seconds with bRW andw/c follow 11/9: 43.3 seconds with RW and w/c follow  Goal status: In progress  4.  Patient will be able to get into bed independently with use of railing to increase independence with mobility and nighttime routine.  Baseline: 10/10: needs complete assistance to bring RLE into bed. 11/9: needs assistance with leg;  Goal status: In progress  5.  Patient will go up 2 small steps to enter sisters house with min A.  Baseline: 10/10: unable to perform 11/9: unable to perform on 2 " step   Goal status: IN PROGRESS   ASSESSMENT:  CLINICAL IMPRESSION:  The patient enjoyed standing exercises with the speed bad today and was motivated to challenge themselves throughout the speed bag progression. The patient was able to complete four 30 seconds speed bag progressions in standing with minimal assistance from the therapist. The patient was fatigued after  the 4th round and required a rest break and ice pack to cool down before continuing with treatment. The patient did well with seated exercises following and continues to demonstrate  core strength improvement. The patient continues to make gradual but significant improvement each session, the patient will continue to benefit from skilled therapy.   OBJECTIVE IMPAIRMENTS Abnormal gait, decreased activity tolerance, decreased balance, decreased coordination, decreased endurance, decreased mobility, difficulty walking, decreased ROM, decreased strength, hypomobility, increased edema, impaired perceived functional ability, increased muscle spasms, impaired flexibility, impaired tone, impaired UE functional use, improper body mechanics, postural dysfunction, obesity, and pain.   ACTIVITY LIMITATIONS carrying, lifting, bending, standing, squatting, sleeping, stairs, transfers, bed mobility, bathing, toileting, dressing, reach over head, locomotion level, and caring for others  PARTICIPATION LIMITATIONS: meal prep, cleaning, laundry, shopping, community activity, and yard work  PERSONAL FACTORS Age, Fitness, Past/current experiences, Sex, Social background, Time since onset of injury/illness/exacerbation, Transportation, and 3+ comorbidities: back pain, CBP, chronic L shoulder pain, galactorrhea, neuropathy, HPV, hypercholestermeia, HTN, MS, osteopenia, PONV, wrist fracture  are also affecting patient's functional outcome.   REHAB POTENTIAL: Fair    CLINICAL DECISION MAKING: Evolving/moderate complexity  EVALUATION COMPLEXITY: Moderate  PLAN: PT FREQUENCY: 2x/week  PT DURATION: 12 weeks  PLANNED INTERVENTIONS: Therapeutic exercises, Therapeutic activity, Neuromuscular re-education, Balance training, Gait training, Patient/Family education, Self Care, Joint mobilization, Stair training, Vestibular training, Canalith repositioning, Visual/preceptual remediation/compensation, Orthotic/Fit training, DME instructions, Dry Needling, Electrical stimulation, Wheelchair mobility training, Spinal mobilization, Cryotherapy, Moist heat, Taping, Vasopneumatic device, Traction, Ultrasound,  Ionotophoresis 7m/ml Dexamethasone, Manual therapy, and Re-evaluation  PLAN FOR NEXT SESSION: standing balance and strengthening, work on ambulation of decline and standing balance with decline.    PSudie Bailey SPT  This entire session was performed under direct supervision and direction of a licensed therapist/therapist assistant . I have personally read, edited and approve of the note as written.   MJanna Arch PT 03/23/2022, 12:34 PM

## 2022-03-23 NOTE — Telephone Encounter (Signed)
Called and spoke with pt. Provided her phone# and extension to call [facility] back to provide updated insurance. She advised she will go ahead and call.

## 2022-03-28 ENCOUNTER — Ambulatory Visit: Payer: PPO

## 2022-03-28 DIAGNOSIS — R262 Difficulty in walking, not elsewhere classified: Secondary | ICD-10-CM

## 2022-03-28 DIAGNOSIS — R2681 Unsteadiness on feet: Secondary | ICD-10-CM

## 2022-03-28 DIAGNOSIS — R278 Other lack of coordination: Secondary | ICD-10-CM

## 2022-03-28 DIAGNOSIS — M6281 Muscle weakness (generalized): Secondary | ICD-10-CM

## 2022-03-28 NOTE — Therapy (Signed)
OUTPATIENT PHYSICAL THERAPY NEURO TREATMENT  Patient Name: Lisa Williams MRN: [id number] DOB:02/05/1961, 61 y.o., female Today's Date: 03/28/2022  PCP: Kirk Ruths  REFERRING PROVIDER: Britt Bottom MD   PT End of Session - 03/28/22 1014     Visit Number 13    Number of Visits 24    Date for PT Re-Evaluation 05/09/22    Authorization Type 2/10 PN 03/16/22    PT Start Time 1015    PT Stop Time 1100    PT Time Calculation (min) 45 min    Equipment Utilized During Treatment Gait belt    Activity Tolerance Patient tolerated treatment well    Behavior During Therapy [facility] for tasks assessed/performed            Past Medical History:  Diagnosis Date   Abdominal pain, right upper quadrant    Back pain    Calculus of kidney 12/09/2013   Chronic back pain    unspecified   Chronic left shoulder pain 0/01/3817   Complication of anesthesia    Functional disorder of bladder    other   Galactorrhea 11/26/2014   Chronic    Hereditary and idiopathic neuropathy 08/19/2013   HPV test positive    Hypercholesteremia 08/19/2013   Hypertension    Incomplete bladder emptying    Microscopic hematuria    MS (multiple sclerosis) (HCC)    Muscle spasticity 05/21/2014   Nonspecific findings on examination of urine    other   Osteopenia    PONV (postoperative nausea and vomiting)    Status post laparoscopic supracervical hysterectomy 11/26/2014   Tobacco user 11/26/2014   Wrist fracture    Past Surgical History:  Procedure Laterality Date   bilateral tubal ligation  1996   BREAST CYST EXCISION Left 2002   FRACTURE SURGERY     KNEE SURGERY     right   LAPAROSCOPIC SUPRACERVICAL HYSTERECTOMY  08/05/2013   ORIF WRIST FRACTURE Left 01/17/2017   Procedure: OPEN REDUCTION INTERNAL FIXATION (ORIF) WRIST FRACTURE;  Surgeon: Lovell Sheehan, MD;  Location: [facility];  Service: Orthopedics;  Laterality: Left;   RADIOLOGY WITH ANESTHESIA N/A 03/18/2020   Procedure: MRI WITH  ANESTHESIA CERVICAL SPINE AND BRAIN  WITH AND WITHOUT CONTRAST;  Surgeon: Radiologist, Medication, MD;  Location: [place];  Service: Radiology;  Laterality: N/A;   TUBAL LIGATION Bilateral    VAGINAL HYSTERECTOMY  03/2006   Patient Active Problem List   Diagnosis Date Noted   Abscess of left groin 11/15/2021   Abnormal LFTs 11/15/2021   Acute respiratory disease due to COVID-19 virus 11/21/2020   Weakness    Hypoalbuminemia due to protein-calorie malnutrition (HCC)    Neurogenic bowel    Neurogenic bladder    Labile blood pressure    Neuropathic pain    Abscess of female pelvis    SVT (supraventricular tachycardia)    Radial styloid tenosynovitis 03/12/2018   Wheelchair confinement 02/27/2018   Localized osteoporosis with current pathological fracture with routine healing 01/19/2017   Wrist fracture 01/16/2017   Sprain of ankle 03/23/2016   Closed fracture of lateral malleolus 03/16/2016   Health care maintenance 01/24/2016   Blood pressure elevated without history of HTN 10/25/2015   Essential hypertension 10/25/2015   Multiple sclerosis ([place]) 10/02/2015   Chronic left shoulder pain 07/19/2015   Multiple sclerosis exacerbation ([place]) 07/14/2015   MS (multiple sclerosis) ([place]) 11/26/2014   Increased body mass index 11/26/2014   HPV test positive 11/26/2014   Status post laparoscopic  supracervical hysterectomy 11/26/2014   Galactorrhea 11/26/2014   Back ache 05/21/2014   Adiposity 05/21/2014   Disordered sleep 05/21/2014   Muscle spasticity 05/21/2014   Spasticity 05/21/2014   Calculus of kidney 61/44/3154   Renal colic 00/86/7619   Hypercholesteremia 08/19/2013   Hereditary and idiopathic neuropathy 08/19/2013   Hypercholesterolemia without hypertriglyceridemia 08/19/2013   Bladder infection, chronic 07/25/2012   Disorder of bladder function 07/25/2012   Incomplete bladder emptying 07/25/2012   Microscopic hematuria 07/25/2012   Right upper quadrant pain 07/25/2012    ONSET DATE: 1995  REFERRING DIAG: MS  THERAPY DIAG:  Muscle weakness (generalized)  Unsteadiness on feet  Difficulty in walking, not elsewhere classified  Other lack of coordination  Rationale for Evaluation and Treatment Rehabilitation  SUBJECTIVE:                                                                                                                                                                                          SUBJECTIVE STATEMENT:   The patient reports no significant changes since last visit and no falls. The patient doesn't present to therapy with any pain.    Pt accompanied by: self  PERTINENT HISTORY: Patient is retuning to PT after hospitalization on 11/14/21 where she then went to SNF and then received home health therapy which stopped 02/08/22. Patient has weakness in BLE with RLE>LLE. She drives with hand controls. Patient has been diagnosed with MS in 1995.  PMH includes: back pain, CBP, chronic L shoulder pain, galactorrhea, neuropathy, HPV, hypercholestermeia, HTN, MS, osteopenia, PONV, wrist fracture. Started back driving about two weeks ago.   PAIN:  Are you having pain? Yes: NPRS scale: 2/10 Pain location: abdomen to R leg Pain description: spasm Aggravating factors: unknown Relieving factors: n/a  PRECAUTIONS: Fall  WEIGHT BEARING RESTRICTIONS No  FALLS: Has patient fallen in last 6 months? No  LIVING ENVIRONMENT: Lives with: lives with their family Lives in: House/apartment Stairs: Yes: Internal: full flight steps; but lives fully on first floor Has following equipment at home: Gilford Rile - 2 wheeled, Wheelchair (manual), shower chair, Grab bars, and Ramped entry  PLOF: Independent with household mobility with device  PATIENT GOALS be able to get into the bed without assistance, improve ambulation, standing tolerance, going up steps.   OBJECTIVE:   DIAGNOSTIC FINDINGS: MRI of the brain 03/18/2020 showed multiple T2/FLAIR  hyperintense foci in the periventricular, juxtacortical and deep white matter.  There were no infratentorial lesions noted.  None of the foci enhanced.   Due to severe claustrophobia, the study was done with conscious sedation in the hospital  MRI of the cervical spine 03/18/2020 showed T2  hyperintense foci at C1-C2, C2-C3, C4-C5, C5-C6 and T1.  There is a disc protrusion at C5-C6 to the left and a larger disc protrusion at C6-C7 to the right that distorts the thecal sac.  The adjacent spinal cord has normal signal.  There could be compression of the right C7 nerve root.   LOWER EXTREMITY MMT:    MMT Right Eval Left Eval  Hip flexion 2- 2  Hip extension 2- 2  Hip abduction 2+ 2+  Hip adduction 2+ 2+  Knee flexion 2- 2  Knee extension 2 2  Ankle dorsiflexion trace trace  Ankle plantarflexion trace trace  (Blank rows = not tested)   TODAY'S TREATMENT:  Neuro Rehab:  Ambulation and weightbearing on incline/decline:  - Walking up ramp in hallway upstairs  *Patient was able to ambulate up incline with only one rest break half way up. CGA, Bariatric Walker, and Wheelchair follow*    Standing on decline 2x (CGA, Bariatric Walker, and Wheelchair behind)  - 1st trial: 43 seconds  - 2nd trial: 33 seconds    There Ex:  Seated:  - RTB hip abduction 10x2 - Ball hip adduction 10x2 - 3.5 lb bar with BTB wrapped around bar resisted rows x2 - BTB Pallof Press holds resiting force laterally (B) (Wrap BTB around 3.5 lb bar) 20 seconds x2 - BTB Pallof Press holds resiting force from different angles (B) (Wrap BTB around 3.5 lb bar) 20 seconds x2 - 3.5 lb bar curls with BTB wrapped around 2x10  PATIENT EDUCATION: Education details: goals, POC, outcome measures Person educated: Patient Education method: Explanation, Demonstration, Tactile cues, and Verbal cues Education comprehension: verbalized understanding, returned demonstration, verbal cues required, and tactile cues required   HOME  EXERCISE PROGRAM: Give next session  GOALS: Goals reviewed with patient? Yes  SHORT TERM GOALS: Target date: 03/28/2022  Patient will be independent in home exercise program to improve strength/mobility for better functional independence with ADLs. Baseline:11/9: compliant Goal status: Partially Met  LONG TERM GOALS: Target date: 05/09/2022  Patient will increase FOTO score to equal to or greater than  52%   to demonstrate statistically significant improvement in mobility and quality of life Baseline: 10/10: 42% 11/9: 40% Goal status: in progress  2.  Patient (> 42 years old) will complete five times sit to stand test in < 15 seconds indicating an increased LE strength and improved balance. Baseline: 10/10: 38 seconds with one plop heavy UE support 11/9: 26 seconds with heavy BUE support  Goal status: In progress  3.  Patient will increase 10 meter walk test to >0.5 m/s as to improve gait speed for better community ambulation and to reduce fall risk. Baseline: 10/10: 54 seconds with bRW andw/c follow 11/9: 43.3 seconds with RW and w/c follow  Goal status: In progress  4.  Patient will be able to get into bed independently with use of railing to increase independence with mobility and nighttime routine.  Baseline: 10/10: needs complete assistance to bring RLE into bed. 11/9: needs assistance with leg;  Goal status: In progress  5.  Patient will go up 2 small steps to enter sisters house with min A.  Baseline: 10/10: unable to perform 11/9: unable to perform on 2 " step   Goal status: IN PROGRESS   ASSESSMENT:  CLINICAL IMPRESSION:  The patient continues to present to skilled therapy with excellent motivation and puts forth their maximum effort with all therapeutic exercise. The patient was able to ambulate up an incline  with a bariatric RW, CGA with close w/c follow. The patient was able to make it up the incline with one rest break half way up to prevent overheating. A ice pack  was utilized during rest breaks to keep the patient core temperature low. The patient continues to improve their ability to stand erect and keep LEs advancing while ambulating up incline. The patient did well with standing on a decline and was able to display improved ankle strategies to resist the PA force of the decline on her body. The patient will benefit from continuing to work on maintaining standing balance on a decline in preparation to progressing to ambulating down an decline. Following ambulation the patient performed amazing with seated exercises and functional tasks while maintaining upright balance while seated on an unsteady surface. The patient did well with seated exercises following ambulation and continues to demonstrate core strength gross upper body strength. The patient continues to make improvement each session, the patient will continue to benefit from skilled therapy.   OBJECTIVE IMPAIRMENTS Abnormal gait, decreased activity tolerance, decreased balance, decreased coordination, decreased endurance, decreased mobility, difficulty walking, decreased ROM, decreased strength, hypomobility, increased edema, impaired perceived functional ability, increased muscle spasms, impaired flexibility, impaired tone, impaired UE functional use, improper body mechanics, postural dysfunction, obesity, and pain.   ACTIVITY LIMITATIONS carrying, lifting, bending, standing, squatting, sleeping, stairs, transfers, bed mobility, bathing, toileting, dressing, reach over head, locomotion level, and caring for others  PARTICIPATION LIMITATIONS: meal prep, cleaning, laundry, shopping, community activity, and yard work  PERSONAL FACTORS Age, Fitness, Past/current experiences, Sex, Social background, Time since onset of injury/illness/exacerbation, Transportation, and 3+ comorbidities: back pain, CBP, chronic L shoulder pain, galactorrhea, neuropathy, HPV, hypercholestermeia, HTN, MS, osteopenia, PONV, wrist  fracture  are also affecting patient's functional outcome.   REHAB POTENTIAL: Fair    CLINICAL DECISION MAKING: Evolving/moderate complexity  EVALUATION COMPLEXITY: Moderate  PLAN: PT FREQUENCY: 2x/week  PT DURATION: 12 weeks  PLANNED INTERVENTIONS: Therapeutic exercises, Therapeutic activity, Neuromuscular re-education, Balance training, Gait training, Patient/Family education, Self Care, Joint mobilization, Stair training, Vestibular training, Canalith repositioning, Visual/preceptual remediation/compensation, Orthotic/Fit training, DME instructions, Dry Needling, Electrical stimulation, Wheelchair mobility training, Spinal mobilization, Cryotherapy, Moist heat, Taping, Vasopneumatic device, Traction, Ultrasound, Ionotophoresis 29m/ml Dexamethasone, Manual therapy, and Re-evaluation  PLAN FOR NEXT SESSION: standing balance and strengthening, work on ambulation of decline and standing balance with decline.    PSudie Bailey SPT  This entire session was performed under direct supervision and direction of a licensed therapist/therapist assistant . I have personally read, edited and approve of the note as written.   MJanna Arch PT 03/28/2022, 1:31 PM

## 2022-03-29 ENCOUNTER — Encounter: Payer: Self-pay | Admitting: Neurology

## 2022-03-29 ENCOUNTER — Other Ambulatory Visit: Payer: Self-pay | Admitting: Internal Medicine

## 2022-03-29 DIAGNOSIS — Z1231 Encounter for screening mammogram for malignant neoplasm of breast: Secondary | ICD-10-CM

## 2022-04-04 ENCOUNTER — Ambulatory Visit: Payer: PPO

## 2022-04-04 DIAGNOSIS — M6281 Muscle weakness (generalized): Secondary | ICD-10-CM

## 2022-04-04 DIAGNOSIS — R262 Difficulty in walking, not elsewhere classified: Secondary | ICD-10-CM

## 2022-04-04 DIAGNOSIS — R278 Other lack of coordination: Secondary | ICD-10-CM

## 2022-04-04 DIAGNOSIS — R269 Unspecified abnormalities of gait and mobility: Secondary | ICD-10-CM

## 2022-04-04 DIAGNOSIS — R2681 Unsteadiness on feet: Secondary | ICD-10-CM

## 2022-04-04 DIAGNOSIS — Z0289 Encounter for other administrative examinations: Secondary | ICD-10-CM

## 2022-04-04 NOTE — Therapy (Signed)
OUTPATIENT PHYSICAL THERAPY NEURO TREATMENT  Patient Name: Lisa Williams MRN: [id number] DOB:02/05/1961, 61 y.o., female Today's Date: 03/28/2022  PCP: Kirk Ruths  REFERRING PROVIDER: Britt Bottom MD   PT End of Session - 03/28/22 1014     Visit Number 13    Number of Visits 24    Date for PT Re-Evaluation 05/09/22    Authorization Type 2/10 PN 03/16/22    PT Start Time 1015    PT Stop Time 1100    PT Time Calculation (min) 45 min    Equipment Utilized During Treatment Gait belt    Activity Tolerance Patient tolerated treatment well    Behavior During Therapy [facility] for tasks assessed/performed            Past Medical History:  Diagnosis Date   Abdominal pain, right upper quadrant    Back pain    Calculus of kidney 12/09/2013   Chronic back pain    unspecified   Chronic left shoulder pain 0/01/3817   Complication of anesthesia    Functional disorder of bladder    other   Galactorrhea 11/26/2014   Chronic    Hereditary and idiopathic neuropathy 08/19/2013   HPV test positive    Hypercholesteremia 08/19/2013   Hypertension    Incomplete bladder emptying    Microscopic hematuria    MS (multiple sclerosis) (HCC)    Muscle spasticity 05/21/2014   Nonspecific findings on examination of urine    other   Osteopenia    PONV (postoperative nausea and vomiting)    Status post laparoscopic supracervical hysterectomy 11/26/2014   Tobacco user 11/26/2014   Wrist fracture    Past Surgical History:  Procedure Laterality Date   bilateral tubal ligation  1996   BREAST CYST EXCISION Left 2002   FRACTURE SURGERY     KNEE SURGERY     right   LAPAROSCOPIC SUPRACERVICAL HYSTERECTOMY  08/05/2013   ORIF WRIST FRACTURE Left 01/17/2017   Procedure: OPEN REDUCTION INTERNAL FIXATION (ORIF) WRIST FRACTURE;  Surgeon: Lovell Sheehan, MD;  Location: [facility];  Service: Orthopedics;  Laterality: Left;   RADIOLOGY WITH ANESTHESIA N/A 03/18/2020   Procedure: MRI WITH  ANESTHESIA CERVICAL SPINE AND BRAIN  WITH AND WITHOUT CONTRAST;  Surgeon: Radiologist, Medication, MD;  Location: [place];  Service: Radiology;  Laterality: N/A;   TUBAL LIGATION Bilateral    VAGINAL HYSTERECTOMY  03/2006   Patient Active Problem List   Diagnosis Date Noted   Abscess of left groin 11/15/2021   Abnormal LFTs 11/15/2021   Acute respiratory disease due to COVID-19 virus 11/21/2020   Weakness    Hypoalbuminemia due to protein-calorie malnutrition (HCC)    Neurogenic bowel    Neurogenic bladder    Labile blood pressure    Neuropathic pain    Abscess of female pelvis    SVT (supraventricular tachycardia)    Radial styloid tenosynovitis 03/12/2018   Wheelchair confinement 02/27/2018   Localized osteoporosis with current pathological fracture with routine healing 01/19/2017   Wrist fracture 01/16/2017   Sprain of ankle 03/23/2016   Closed fracture of lateral malleolus 03/16/2016   Health care maintenance 01/24/2016   Blood pressure elevated without history of HTN 10/25/2015   Essential hypertension 10/25/2015   Multiple sclerosis ([place]) 10/02/2015   Chronic left shoulder pain 07/19/2015   Multiple sclerosis exacerbation ([place]) 07/14/2015   MS (multiple sclerosis) ([place]) 11/26/2014   Increased body mass index 11/26/2014   HPV test positive 11/26/2014   Status post laparoscopic  supracervical hysterectomy 11/26/2014   Galactorrhea 11/26/2014   Back ache 05/21/2014   Adiposity 05/21/2014   Disordered sleep 05/21/2014   Muscle spasticity 05/21/2014   Spasticity 05/21/2014   Calculus of kidney 85/27/7824   Renal colic 23/53/6144   Hypercholesteremia 08/19/2013   Hereditary and idiopathic neuropathy 08/19/2013   Hypercholesterolemia without hypertriglyceridemia 08/19/2013   Bladder infection, chronic 07/25/2012   Disorder of bladder function 07/25/2012   Incomplete bladder emptying 07/25/2012   Microscopic hematuria 07/25/2012   Right upper quadrant pain 07/25/2012    ONSET DATE: 1995  REFERRING DIAG: MS  THERAPY DIAG:  Muscle weakness (generalized)  Unsteadiness on feet  Difficulty in walking, not elsewhere classified  Other lack of coordination  Rationale for Evaluation and Treatment Rehabilitation  SUBJECTIVE:                                                                                                                                                                                          SUBJECTIVE STATEMENT:  The patient reports they had a good holiday and a restful weekend. The patient reports no significant changes since last visit and no falls. The patient doesn't present to therapy with any pain but reports they have been experiencing some muscle spasms today.    Pt accompanied by: self  PERTINENT HISTORY: Patient is retuning to PT after hospitalization on 11/14/21 where she then went to SNF and then received home health therapy which stopped 02/08/22. Patient has weakness in BLE with RLE>LLE. She drives with hand controls. Patient has been diagnosed with MS in 1995.  PMH includes: back pain, CBP, chronic L shoulder pain, galactorrhea, neuropathy, HPV, hypercholestermeia, HTN, MS, osteopenia, PONV, wrist fracture. Started back driving about two weeks ago.   PAIN:  Are you having pain? Yes: NPRS scale: 2/10 Pain location: abdomen to R leg Pain description: spasm Aggravating factors: unknown Relieving factors: n/a  PRECAUTIONS: Fall  WEIGHT BEARING RESTRICTIONS No  FALLS: Has patient fallen in last 6 months? No  LIVING ENVIRONMENT: Lives with: lives with their family Lives in: House/apartment Stairs: Yes: Internal: full flight steps; but lives fully on first floor Has following equipment at home: Gilford Rile - 2 wheeled, Wheelchair (manual), shower chair, Grab bars, and Ramped entry  PLOF: Independent with household mobility with device  PATIENT GOALS be able to get into the bed without assistance, improve ambulation, standing  tolerance, going up steps.   OBJECTIVE:   DIAGNOSTIC FINDINGS: MRI of the brain 03/18/2020 showed multiple T2/FLAIR hyperintense foci in the periventricular, juxtacortical and deep white matter.  There were no infratentorial lesions noted.  None of the foci enhanced.   Due to  severe claustrophobia, the study was done with conscious sedation in the hospital  MRI of the cervical spine 03/18/2020 showed T2 hyperintense foci at C1-C2, C2-C3, C4-C5, C5-C6 and T1.  There is a disc protrusion at C5-C6 to the left and a larger disc protrusion at C6-C7 to the right that distorts the thecal sac.  The adjacent spinal cord has normal signal.  There could be compression of the right C7 nerve root.   LOWER EXTREMITY MMT:    MMT Right Eval Left Eval  Hip flexion 2- 2  Hip extension 2- 2  Hip abduction 2+ 2+  Hip adduction 2+ 2+  Knee flexion 2- 2  Knee extension 2 2  Ankle dorsiflexion trace trace  Ankle plantarflexion trace trace  (Blank rows = not tested)   TODAY'S TREATMENT:   Neuro Rehab:  Ambulation in hallway: 1 lap (CGA, Bariatric Walker, wheelchair follow)  *Patient required 2 seated rest breaks*  There Ex:   Seated:  Matrix Machine Rows: Warm up (10x7.5 lbs), Working sets (10x17.5 lbs, 10x22.5 lbs)  Matrix Machine Pallof Press: Warm up (2x10x7.5 lbs) each side  Matrix Machines single arm retraction (helped decrease muscle spasm) 1x10x7.5 lb   - RTB hip abduction 10x - Ball hip adduction 10x - Shoulder flexion with RTB 10x - Seated on dyna disc hand clap game 2x minutes for perturbations and core stabilization    PATIENT EDUCATION: Education details: goals, POC, outcome measures Person educated: Patient Education method: Explanation, Demonstration, Tactile cues, and Verbal cues Education comprehension: verbalized understanding, returned demonstration, verbal cues required, and tactile cues required   HOME EXERCISE PROGRAM: Give next session  GOALS: Goals reviewed with  patient? Yes  SHORT TERM GOALS: Target date: 03/28/2022  Patient will be independent in home exercise program to improve strength/mobility for better functional independence with ADLs. Baseline:11/9: compliant Goal status: Partially Met  LONG TERM GOALS: Target date: 05/09/2022  Patient will increase FOTO score to equal to or greater than  52%   to demonstrate statistically significant improvement in mobility and quality of life Baseline: 10/10: 42% 11/9: 40% Goal status: in progress  2.  Patient (> 60 years old) will complete five times sit to stand test in < 15 seconds indicating an increased LE strength and improved balance. Baseline: 10/10: 38 seconds with one plop heavy UE support 11/9: 26 seconds with heavy BUE support  Goal status: In progress  3.  Patient will increase 10 meter walk test to >0.5 m/s as to improve gait speed for better community ambulation and to reduce fall risk. Baseline: 10/10: 54 seconds with bRW andw/c follow 11/9: 43.3 seconds with RW and w/c follow  Goal status: In progress  4.  Patient will be able to get into bed independently with use of railing to increase independence with mobility and nighttime routine.  Baseline: 10/10: needs complete assistance to bring RLE into bed. 11/9: needs assistance with leg;  Goal status: In progress  5.  Patient will go up 2 small steps to enter sisters house with min A.  Baseline: 10/10: unable to perform 11/9: unable to perform on 2 " step   Goal status: IN PROGRESS   ASSESSMENT:  CLINICAL IMPRESSION:  The patient continues to present to skilled therapy with great motivation and puts forth their fantastic effort. The patient presented to therapy experiencing muscle spasms and was limited by these spasms in her ability to ambulate today. The patient was able to ambulate 1 lap in the hallway with a bariatric  RW, CGA with close w/c follow. Following ambulation the patient performed great with seated strengthening  exercises with the matrix machine and dynamics rhythmic tasks while maintaining upright balance while seated on an unsteady surface. The patient did well with seated exercises following ambulation and continues to demonstrate increased upper body strength. The patient continues to make improvement each session, the patient will continue to benefit from skilled therapy.   OBJECTIVE IMPAIRMENTS Abnormal gait, decreased activity tolerance, decreased balance, decreased coordination, decreased endurance, decreased mobility, difficulty walking, decreased ROM, decreased strength, hypomobility, increased edema, impaired perceived functional ability, increased muscle spasms, impaired flexibility, impaired tone, impaired UE functional use, improper body mechanics, postural dysfunction, obesity, and pain.   ACTIVITY LIMITATIONS carrying, lifting, bending, standing, squatting, sleeping, stairs, transfers, bed mobility, bathing, toileting, dressing, reach over head, locomotion level, and caring for others  PARTICIPATION LIMITATIONS: meal prep, cleaning, laundry, shopping, community activity, and yard work  PERSONAL FACTORS Age, Fitness, Past/current experiences, Sex, Social background, Time since onset of injury/illness/exacerbation, Transportation, and 3+ comorbidities: back pain, CBP, chronic L shoulder pain, galactorrhea, neuropathy, HPV, hypercholestermeia, HTN, MS, osteopenia, PONV, wrist fracture  are also affecting patient's functional outcome.   REHAB POTENTIAL: Fair    CLINICAL DECISION MAKING: Evolving/moderate complexity  EVALUATION COMPLEXITY: Moderate  PLAN: PT FREQUENCY: 2x/week  PT DURATION: 12 weeks  PLANNED INTERVENTIONS: Therapeutic exercises, Therapeutic activity, Neuromuscular re-education, Balance training, Gait training, Patient/Family education, Self Care, Joint mobilization, Stair training, Vestibular training, Canalith repositioning, Visual/preceptual remediation/compensation,  Orthotic/Fit training, DME instructions, Dry Needling, Electrical stimulation, Wheelchair mobility training, Spinal mobilization, Cryotherapy, Moist heat, Taping, Vasopneumatic device, Traction, Ultrasound, Ionotophoresis 67m/ml Dexamethasone, Manual therapy, and Re-evaluation  PLAN FOR NEXT SESSION: standing balance and strengthening, work on ambulation of decline and standing balance with decline.    PSudie Bailey SPT  This entire session was performed under direct supervision and direction of a licensed therapist/therapist assistant . I have personally read, edited and approve of the note as written.   MJanna Arch PT 03/28/2022, 1:31 PM

## 2022-04-05 NOTE — Therapy (Signed)
OUTPATIENT PHYSICAL THERAPY NEURO TREATMENT  Patient Name: Lisa Williams MRN: [id number] DOB:June 30, 1960, 61 y.o., female Today's Date: 04/06/2022  PCP: Kirk Ruths  REFERRING PROVIDER: Britt Bottom MD   PT End of Session - 04/06/22 1107     Visit Number 15    Number of Visits 24    Date for PT Re-Evaluation 05/09/22    Authorization Type 2/10 PN 03/16/22    PT Start Time 1015    PT Stop Time 1100    PT Time Calculation (min) 45 min    Equipment Utilized During Treatment Gait belt    Activity Tolerance Patient tolerated treatment well;Patient limited by fatigue    Behavior During Therapy [facility] for tasks assessed/performed            Past Medical History:  Diagnosis Date   Abdominal pain, right upper quadrant    Back pain    Calculus of kidney 12/09/2013   Chronic back pain    unspecified   Chronic left shoulder pain 7/91/5056   Complication of anesthesia    Functional disorder of bladder    other   Galactorrhea 11/26/2014   Chronic    Hereditary and idiopathic neuropathy 08/19/2013   HPV test positive    Hypercholesteremia 08/19/2013   Hypertension    Incomplete bladder emptying    Microscopic hematuria    MS (multiple sclerosis) (HCC)    Muscle spasticity 05/21/2014   Nonspecific findings on examination of urine    other   Osteopenia    PONV (postoperative nausea and vomiting)    Status post laparoscopic supracervical hysterectomy 11/26/2014   Tobacco user 11/26/2014   Wrist fracture    Past Surgical History:  Procedure Laterality Date   bilateral tubal ligation  1996   BREAST CYST EXCISION Left 2002   FRACTURE SURGERY     KNEE SURGERY     right   LAPAROSCOPIC SUPRACERVICAL HYSTERECTOMY  08/05/2013   ORIF WRIST FRACTURE Left 01/17/2017   Procedure: OPEN REDUCTION INTERNAL FIXATION (ORIF) WRIST FRACTURE;  Surgeon: Lovell Sheehan, MD;  Location: [facility];  Service: Orthopedics;  Laterality: Left;   RADIOLOGY WITH ANESTHESIA N/A 03/18/2020    Procedure: MRI WITH ANESTHESIA CERVICAL SPINE AND BRAIN  WITH AND WITHOUT CONTRAST;  Surgeon: Radiologist, Medication, MD;  Location: [facility];  Service: Radiology;  Laterality: N/A;   TUBAL LIGATION Bilateral    VAGINAL HYSTERECTOMY  03/2006   Patient Active Problem List   Diagnosis Date Noted   Abscess of left groin 11/15/2021   Abnormal LFTs 11/15/2021   Acute respiratory disease due to COVID-19 virus 11/21/2020   Weakness    Hypoalbuminemia due to protein-calorie malnutrition [facility])    Neurogenic bowel    Neurogenic bladder    Labile blood pressure    Neuropathic pain    Abscess of female pelvis    SVT (supraventricular tachycardia)    Radial styloid tenosynovitis 03/12/2018   Wheelchair confinement 02/27/2018   Localized osteoporosis with current pathological fracture with routine healing 01/19/2017   Wrist fracture 01/16/2017   Sprain of ankle 03/23/2016   Closed fracture of lateral malleolus 03/16/2016   Health care maintenance 01/24/2016   Blood pressure elevated without history of HTN 10/25/2015   Essential hypertension 10/25/2015   Multiple sclerosis ([place]) 10/02/2015   Chronic left shoulder pain 07/19/2015   Multiple sclerosis exacerbation ([place]) 07/14/2015   MS (multiple sclerosis) ([place]) 11/26/2014   Increased body mass index 11/26/2014   HPV test positive 11/26/2014  Status post laparoscopic supracervical hysterectomy 11/26/2014   Galactorrhea 11/26/2014   Back ache 05/21/2014   Adiposity 05/21/2014   Disordered sleep 05/21/2014   Muscle spasticity 05/21/2014   Spasticity 05/21/2014   Calculus of kidney 23/76/2831   Renal colic 51/76/1607   Hypercholesteremia 08/19/2013   Hereditary and idiopathic neuropathy 08/19/2013   Hypercholesterolemia without hypertriglyceridemia 08/19/2013   Bladder infection, chronic 07/25/2012   Disorder of bladder function 07/25/2012   Incomplete bladder emptying 07/25/2012   Microscopic hematuria 07/25/2012   Right upper quadrant  pain 07/25/2012   ONSET DATE: 1995  REFERRING DIAG: MS  THERAPY DIAG:  Muscle weakness (generalized)  Unsteadiness on feet  Difficulty in walking, not elsewhere classified  Abnormality of gait and mobility  Multiple sclerosis exacerbation (HCC)  Rationale for Evaluation and Treatment Rehabilitation  SUBJECTIVE:                                                                                                                                                                                          SUBJECTIVE STATEMENT:  The patient reports no significant changes since last visit and no falls. The patient reports they have been experiencing increased muscle spasms today but doesn't report any pain.    Pt accompanied by: self  PERTINENT HISTORY: Patient is retuning to PT after hospitalization on 11/14/21 where she then went to SNF and then received home health therapy which stopped 02/08/22. Patient has weakness in BLE with RLE>LLE. She drives with hand controls. Patient has been diagnosed with MS in 1995.  PMH includes: back pain, CBP, chronic L shoulder pain, galactorrhea, neuropathy, HPV, hypercholestermeia, HTN, MS, osteopenia, PONV, wrist fracture. Started back driving about two weeks ago.   PAIN:  Are you having pain? Yes: NPRS scale: 2/10 Pain location: abdomen to R leg Pain description: spasm Aggravating factors: unknown Relieving factors: n/a  PRECAUTIONS: Fall  WEIGHT BEARING RESTRICTIONS No  FALLS: Has patient fallen in last 6 months? No  LIVING ENVIRONMENT: Lives with: lives with their family Lives in: House/apartment Stairs: Yes: Internal: full flight steps; but lives fully on first floor Has following equipment at home: Gilford Rile - 2 wheeled, Wheelchair (manual), shower chair, Grab bars, and Ramped entry  PLOF: Independent with household mobility with device  PATIENT GOALS be able to get into the bed without assistance, improve ambulation, standing tolerance, going  up steps.   OBJECTIVE:   DIAGNOSTIC FINDINGS: MRI of the brain 03/18/2020 showed multiple T2/FLAIR hyperintense foci in the periventricular, juxtacortical and deep white matter.  There were no infratentorial lesions noted.  None of the foci enhanced.   Due to severe claustrophobia, the study was done  with conscious sedation in the hospital  MRI of the cervical spine 03/18/2020 showed T2 hyperintense foci at C1-C2, C2-C3, C4-C5, C5-C6 and T1.  There is a disc protrusion at C5-C6 to the left and a larger disc protrusion at C6-C7 to the right that distorts the thecal sac.  The adjacent spinal cord has normal signal.  There could be compression of the right C7 nerve root.   LOWER EXTREMITY MMT:    MMT Right Eval Left Eval  Hip flexion 2- 2  Hip extension 2- 2  Hip abduction 2+ 2+  Hip adduction 2+ 2+  Knee flexion 2- 2  Knee extension 2 2  Ankle dorsiflexion trace trace  Ankle plantarflexion trace trace  (Blank rows = not tested)   TODAY'S TREATMENT:   Manual Therapy: (Patient in wheelchair with arms elevated on massage table)  - STM to (R) lower back (Effleurage, Ptrissage, TP release) 8 min  Neuro Rehab: (CGA, Wheelchair Follow, Ice pack during seated rest breaks to cool down) Walking up incline: 1 trial up incline upstairs, 2 seated rest breaks required   There Ex:  Seated:  Swiss Ball 3-way roll outs 5x10 seconds hold at ER ea direction  [employer] Rows: 12.5 lbs 2x10 Matrix Machine Pull Down: 12.5 lbs 2x10 [place] Press: 7.5 lbs 2x10 each side   PATIENT EDUCATION: Education details: goals, POC, outcome measures Person educated: Patient Education method: Explanation, Demonstration, Tactile cues, and Verbal cues Education comprehension: verbalized understanding, returned demonstration, verbal cues required, and tactile cues required   HOME EXERCISE PROGRAM: Give next session  GOALS: Goals reviewed with patient? Yes  SHORT TERM GOALS: Target date:  03/28/2022  Patient will be independent in home exercise program to improve strength/mobility for better functional independence with ADLs. Baseline:11/9: compliant Goal status: Partially Met  LONG TERM GOALS: Target date: 05/09/2022  Patient will increase FOTO score to equal to or greater than  52%   to demonstrate statistically significant improvement in mobility and quality of life Baseline: 10/10: 42% 11/9: 40% Goal status: in progress  2.  Patient (> 28 years old) will complete five times sit to stand test in < 15 seconds indicating an increased LE strength and improved balance. Baseline: 10/10: 38 seconds with one plop heavy UE support 11/9: 26 seconds with heavy BUE support  Goal status: In progress  3.  Patient will increase 10 meter walk test to >0.5 m/s as to improve gait speed for better community ambulation and to reduce fall risk. Baseline: 10/10: 54 seconds with bRW andw/c follow 11/9: 43.3 seconds with RW and w/c follow  Goal status: In progress  4.  Patient will be able to get into bed independently with use of railing to increase independence with mobility and nighttime routine.  Baseline: 10/10: needs complete assistance to bring RLE into bed. 11/9: needs assistance with leg;  Goal status: In progress  5.  Patient will go up 2 small steps to enter sisters house with min A.  Baseline: 10/10: unable to perform 11/9: unable to perform on 2 " step   Goal status: IN PROGRESS   ASSESSMENT:  CLINICAL IMPRESSION: The patient presented to therapy still experiencing muscle spasms they were experiencing on Tuesday, the patient reports that they are worse today. The patient was limited by these spasms in her ability to ambulate up an incline today, but the patient continued to have great motivation and give all of their effort through the muscle spasms. The patient was able to ambulate 1  lap in the hallway with a bariatric RW, CGA with close w/c follow. Following ambulation STM  was performed to the patients (R) low back to decrease tension from muscle spasms. The patient reported a reduction in overall muscle tension following. The patient performed great with seated strengthening exercises with the matrix machine following manual therapy. The patient continues to make improvement each session, the patient will continue to benefit from skilled therapy.   OBJECTIVE IMPAIRMENTS Abnormal gait, decreased activity tolerance, decreased balance, decreased coordination, decreased endurance, decreased mobility, difficulty walking, decreased ROM, decreased strength, hypomobility, increased edema, impaired perceived functional ability, increased muscle spasms, impaired flexibility, impaired tone, impaired UE functional use, improper body mechanics, postural dysfunction, obesity, and pain.   ACTIVITY LIMITATIONS carrying, lifting, bending, standing, squatting, sleeping, stairs, transfers, bed mobility, bathing, toileting, dressing, reach over head, locomotion level, and caring for others  PARTICIPATION LIMITATIONS: meal prep, cleaning, laundry, shopping, community activity, and yard work  PERSONAL FACTORS Age, Fitness, Past/current experiences, Sex, Social background, Time since onset of injury/illness/exacerbation, Transportation, and 3+ comorbidities: back pain, CBP, chronic L shoulder pain, galactorrhea, neuropathy, HPV, hypercholestermeia, HTN, MS, osteopenia, PONV, wrist fracture  are also affecting patient's functional outcome.   REHAB POTENTIAL: Fair    CLINICAL DECISION MAKING: Evolving/moderate complexity  EVALUATION COMPLEXITY: Moderate  PLAN: PT FREQUENCY: 2x/week  PT DURATION: 12 weeks  PLANNED INTERVENTIONS: Therapeutic exercises, Therapeutic activity, Neuromuscular re-education, Balance training, Gait training, Patient/Family education, Self Care, Joint mobilization, Stair training, Vestibular training, Canalith repositioning, Visual/preceptual  remediation/compensation, Orthotic/Fit training, DME instructions, Dry Needling, Electrical stimulation, Wheelchair mobility training, Spinal mobilization, Cryotherapy, Moist heat, Taping, Vasopneumatic device, Traction, Ultrasound, Ionotophoresis 68m/ml Dexamethasone, Manual therapy, and Re-evaluation  PLAN FOR NEXT SESSION: standing balance and strengthening, work on ambulation of decline and standing balance with decline.    PSudie Bailey SPT  This entire session was performed under direct supervision and direction of a licensed therapist/therapist assistant . I have personally read, edited and approve of the note as written.   MJanna Arch PT 04/06/2022, 11:53 AM

## 2022-04-06 ENCOUNTER — Ambulatory Visit: Payer: PPO

## 2022-04-06 DIAGNOSIS — R269 Unspecified abnormalities of gait and mobility: Secondary | ICD-10-CM

## 2022-04-06 DIAGNOSIS — R2681 Unsteadiness on feet: Secondary | ICD-10-CM

## 2022-04-06 DIAGNOSIS — G35D Multiple sclerosis, unspecified: Secondary | ICD-10-CM

## 2022-04-06 DIAGNOSIS — R262 Difficulty in walking, not elsewhere classified: Secondary | ICD-10-CM

## 2022-04-06 DIAGNOSIS — M6281 Muscle weakness (generalized): Secondary | ICD-10-CM

## 2022-04-06 DIAGNOSIS — G35 Multiple sclerosis: Secondary | ICD-10-CM

## 2022-04-11 ENCOUNTER — Ambulatory Visit: Payer: PPO | Attending: Neurology

## 2022-04-11 DIAGNOSIS — R2689 Other abnormalities of gait and mobility: Secondary | ICD-10-CM | POA: Diagnosis not present

## 2022-04-11 DIAGNOSIS — R269 Unspecified abnormalities of gait and mobility: Secondary | ICD-10-CM | POA: Diagnosis not present

## 2022-04-11 DIAGNOSIS — R2681 Unsteadiness on feet: Secondary | ICD-10-CM | POA: Diagnosis not present

## 2022-04-11 DIAGNOSIS — R262 Difficulty in walking, not elsewhere classified: Secondary | ICD-10-CM

## 2022-04-11 DIAGNOSIS — M6281 Muscle weakness (generalized): Secondary | ICD-10-CM

## 2022-04-11 DIAGNOSIS — R278 Other lack of coordination: Secondary | ICD-10-CM | POA: Diagnosis not present

## 2022-04-11 NOTE — Therapy (Signed)
OUTPATIENT PHYSICAL THERAPY NEURO TREATMENT  Patient Name: Lisa Williams MRN: [id number] DOB:09/03/1960, 61 y.o., female Today's Date: 04/11/2022  PCP: Kirk Ruths  REFERRING PROVIDER: Britt Bottom MD   PT End of Session - 04/11/22 1219     Visit Number 16    Number of Visits 24    Date for PT Re-Evaluation 05/09/22    Authorization Type 2/10 PN 03/16/22    PT Start Time 1015    PT Stop Time 1100    PT Time Calculation (min) 45 min    Equipment Utilized During Treatment Gait belt    Activity Tolerance Patient tolerated treatment well;Patient limited by fatigue    Behavior During Therapy [facility] for tasks assessed/performed             Past Medical History:  Diagnosis Date   Abdominal pain, right upper quadrant    Back pain    Calculus of kidney 12/09/2013   Chronic back pain    unspecified   Chronic left shoulder pain 06/09/5425   Complication of anesthesia    Functional disorder of bladder    other   Galactorrhea 11/26/2014   Chronic    Hereditary and idiopathic neuropathy 08/19/2013   HPV test positive    Hypercholesteremia 08/19/2013   Hypertension    Incomplete bladder emptying    Microscopic hematuria    MS (multiple sclerosis) (HCC)    Muscle spasticity 05/21/2014   Nonspecific findings on examination of urine    other   Osteopenia    PONV (postoperative nausea and vomiting)    Status post laparoscopic supracervical hysterectomy 11/26/2014   Tobacco user 11/26/2014   Wrist fracture    Past Surgical History:  Procedure Laterality Date   bilateral tubal ligation  1996   BREAST CYST EXCISION Left 2002   FRACTURE SURGERY     KNEE SURGERY     right   LAPAROSCOPIC SUPRACERVICAL HYSTERECTOMY  08/05/2013   ORIF WRIST FRACTURE Left 01/17/2017   Procedure: OPEN REDUCTION INTERNAL FIXATION (ORIF) WRIST FRACTURE;  Surgeon: Lovell Sheehan, MD;  Location: [facility];  Service: Orthopedics;  Laterality: Left;   RADIOLOGY WITH ANESTHESIA N/A 03/18/2020    Procedure: MRI WITH ANESTHESIA CERVICAL SPINE AND BRAIN  WITH AND WITHOUT CONTRAST;  Surgeon: Radiologist, Medication, MD;  Location: [place];  Service: Radiology;  Laterality: N/A;   TUBAL LIGATION Bilateral    VAGINAL HYSTERECTOMY  03/2006   Patient Active Problem List   Diagnosis Date Noted   Abscess of left groin 11/15/2021   Abnormal LFTs 11/15/2021   Acute respiratory disease due to COVID-19 virus 11/21/2020   Weakness    Hypoalbuminemia due to protein-calorie malnutrition [facility])    Neurogenic bowel    Neurogenic bladder    Labile blood pressure    Neuropathic pain    Abscess of female pelvis    SVT (supraventricular tachycardia)    Radial styloid tenosynovitis 03/12/2018   Wheelchair confinement 02/27/2018   Localized osteoporosis with current pathological fracture with routine healing 01/19/2017   Wrist fracture 01/16/2017   Sprain of ankle 03/23/2016   Closed fracture of lateral malleolus 03/16/2016   Health care maintenance 01/24/2016   Blood pressure elevated without history of HTN 10/25/2015   Essential hypertension 10/25/2015   Multiple sclerosis ([place]) 10/02/2015   Chronic left shoulder pain 07/19/2015   Multiple sclerosis exacerbation ([place]) 07/14/2015   MS (multiple sclerosis) ([place]) 11/26/2014   Increased body mass index 11/26/2014   HPV test positive 11/26/2014  Status post laparoscopic supracervical hysterectomy 11/26/2014   Galactorrhea 11/26/2014   Back ache 05/21/2014   Adiposity 05/21/2014   Disordered sleep 05/21/2014   Muscle spasticity 05/21/2014   Spasticity 05/21/2014   Calculus of kidney 35/45/6256   Renal colic 38/93/7342   Hypercholesteremia 08/19/2013   Hereditary and idiopathic neuropathy 08/19/2013   Hypercholesterolemia without hypertriglyceridemia 08/19/2013   Bladder infection, chronic 07/25/2012   Disorder of bladder function 07/25/2012   Incomplete bladder emptying 07/25/2012   Microscopic hematuria 07/25/2012   Right upper quadrant  pain 07/25/2012   ONSET DATE: 1995  REFERRING DIAG: MS  THERAPY DIAG:  Muscle weakness (generalized)  Unsteadiness on feet  Difficulty in walking, not elsewhere classified  Abnormality of gait and mobility  Other lack of coordination  Rationale for Evaluation and Treatment Rehabilitation  SUBJECTIVE:                                                                                                                                                                                          SUBJECTIVE STATEMENT: The patient reports no significant changes since last visit and no falls. The patient reports they have not experienced any muscle spasms since we worked on her low back last session.    Pt accompanied by: self  PERTINENT HISTORY: Patient is retuning to PT after hospitalization on 11/14/21 where she then went to SNF and then received home health therapy which stopped 02/08/22. Patient has weakness in BLE with RLE>LLE. She drives with hand controls. Patient has been diagnosed with MS in 1995.  PMH includes: back pain, CBP, chronic L shoulder pain, galactorrhea, neuropathy, HPV, hypercholestermeia, HTN, MS, osteopenia, PONV, wrist fracture. Started back driving about two weeks ago.   PAIN:  Are you having pain? Yes: NPRS scale: 2/10 Pain location: abdomen to R leg Pain description: spasm Aggravating factors: unknown Relieving factors: n/a  PRECAUTIONS: Fall  WEIGHT BEARING RESTRICTIONS No  FALLS: Has patient fallen in last 6 months? No  LIVING ENVIRONMENT: Lives with: lives with their family Lives in: House/apartment Stairs: Yes: Internal: full flight steps; but lives fully on first floor Has following equipment at home: Gilford Rile - 2 wheeled, Wheelchair (manual), shower chair, Grab bars, and Ramped entry  PLOF: Independent with household mobility with device  PATIENT GOALS be able to get into the bed without assistance, improve ambulation, standing tolerance, going up steps.    OBJECTIVE:   DIAGNOSTIC FINDINGS: MRI of the brain 03/18/2020 showed multiple T2/FLAIR hyperintense foci in the periventricular, juxtacortical and deep white matter.  There were no infratentorial lesions noted.  None of the foci enhanced.   Due to severe claustrophobia, the study  was done with conscious sedation in the hospital  MRI of the cervical spine 03/18/2020 showed T2 hyperintense foci at C1-C2, C2-C3, C4-C5, C5-C6 and T1.  There is a disc protrusion at C5-C6 to the left and a larger disc protrusion at C6-C7 to the right that distorts the thecal sac.  The adjacent spinal cord has normal signal.  There could be compression of the right C7 nerve root.   LOWER EXTREMITY MMT:    MMT Right Eval Left Eval  Hip flexion 2- 2  Hip extension 2- 2  Hip abduction 2+ 2+  Hip adduction 2+ 2+  Knee flexion 2- 2  Knee extension 2 2  Ankle dorsiflexion trace trace  Ankle plantarflexion trace trace  (Blank rows = not tested)   TODAY'S TREATMENT:  Neuro Rehab:  Speed Bag Progressions:  - Standing alternating punches 30 seconds x2 (SUE support and CGA) - Standing dual tasking punches, call out different punching combinations x1 (BUE support and CGA)    Seated on Dyna Disc Blaze Pod Functional Reach: (CGA from PT behind patient)  - Functional reach taps (5 pods, distractor setting) 3x30 seconds  (Surface elevated each trial)   - Functional reach taps with 3 pods in front of patient and 1 at each side (5 pods, distractor setting) 2x30 seconds   There Ex:  Seated stretching:  - Hamstring lengthening stretch 10x 5 seconds each LE, leg elevated on Pts leg   Seated Strengthening: - 5 lb bar chest press 2x10 - 5 lb BTB wrapped around bar resisted rows x2 - BTB Pallof Press 5x (B) (Wrap BTB around 5 lb bar) - Resisted rotation seated holds (Wrap BTB around D-handle) - RTB hip abduction 10x - RTB hip adduction 10x  PATIENT EDUCATION: Education details: goals, POC, outcome  measures Person educated: Patient Education method: Explanation, Demonstration, Tactile cues, and Verbal cues Education comprehension: verbalized understanding, returned demonstration, verbal cues required, and tactile cues required   HOME EXERCISE PROGRAM: Give next session  GOALS: Goals reviewed with patient? Yes  SHORT TERM GOALS: Target date: 03/28/2022  Patient will be independent in home exercise program to improve strength/mobility for better functional independence with ADLs. Baseline:11/9: compliant Goal status: Partially Met  LONG TERM GOALS: Target date: 05/09/2022  Patient will increase FOTO score to equal to or greater than  52%   to demonstrate statistically significant improvement in mobility and quality of life Baseline: 10/10: 42% 11/9: 40% Goal status: in progress  2.  Patient (> 65 years old) will complete five times sit to stand test in < 15 seconds indicating an increased LE strength and improved balance. Baseline: 10/10: 38 seconds with one plop heavy UE support 11/9: 26 seconds with heavy BUE support  Goal status: In progress  3.  Patient will increase 10 meter walk test to >0.5 m/s as to improve gait speed for better community ambulation and to reduce fall risk. Baseline: 10/10: 54 seconds with bRW andw/c follow 11/9: 43.3 seconds with RW and w/c follow  Goal status: In progress  4.  Patient will be able to get into bed independently with use of railing to increase independence with mobility and nighttime routine.  Baseline: 10/10: needs complete assistance to bring RLE into bed. 11/9: needs assistance with leg;  Goal status: In progress  5.  Patient will go up 2 small steps to enter sisters house with min A.  Baseline: 10/10: unable to perform 11/9: unable to perform on 2 " step   Goal status:  IN PROGRESS   ASSESSMENT:  CLINICAL IMPRESSION: The patient presented to therapy feeling much better today and motivated to engage in therapeutic exercise. The  patient performed great with standing speed bag progressions to work on standing balance and reaching in standing. The patient was also able to demonstrate increased core stability with blaze pod dual tasking exercises that focused on improving reaction time and functional reaching on unstable surfaces. The patient performed great with seated strengthening exercises and continue to make strength improvement each session. Pt will continue to benefit from skilled physical therapy intervention to address impairments, improve QOL, and attain therapy goals.    OBJECTIVE IMPAIRMENTS Abnormal gait, decreased activity tolerance, decreased balance, decreased coordination, decreased endurance, decreased mobility, difficulty walking, decreased ROM, decreased strength, hypomobility, increased edema, impaired perceived functional ability, increased muscle spasms, impaired flexibility, impaired tone, impaired UE functional use, improper body mechanics, postural dysfunction, obesity, and pain.   ACTIVITY LIMITATIONS carrying, lifting, bending, standing, squatting, sleeping, stairs, transfers, bed mobility, bathing, toileting, dressing, reach over head, locomotion level, and caring for others  PARTICIPATION LIMITATIONS: meal prep, cleaning, laundry, shopping, community activity, and yard work  PERSONAL FACTORS Age, Fitness, Past/current experiences, Sex, Social background, Time since onset of injury/illness/exacerbation, Transportation, and 3+ comorbidities: back pain, CBP, chronic L shoulder pain, galactorrhea, neuropathy, HPV, hypercholestermeia, HTN, MS, osteopenia, PONV, wrist fracture  are also affecting patient's functional outcome.   REHAB POTENTIAL: Fair    CLINICAL DECISION MAKING: Evolving/moderate complexity  EVALUATION COMPLEXITY: Moderate  PLAN: PT FREQUENCY: 2x/week  PT DURATION: 12 weeks  PLANNED INTERVENTIONS: Therapeutic exercises, Therapeutic activity, Neuromuscular re-education, Balance  training, Gait training, Patient/Family education, Self Care, Joint mobilization, Stair training, Vestibular training, Canalith repositioning, Visual/preceptual remediation/compensation, Orthotic/Fit training, DME instructions, Dry Needling, Electrical stimulation, Wheelchair mobility training, Spinal mobilization, Cryotherapy, Moist heat, Taping, Vasopneumatic device, Traction, Ultrasound, Ionotophoresis 63m/ml Dexamethasone, Manual therapy, and Re-evaluation  PLAN FOR NEXT SESSION: standing balance and strengthening, work on ambulation of decline and standing balance with decline.    PSudie Bailey SPT  This entire session was performed under direct supervision and direction of a licensed therapist/therapist assistant . I have personally read, edited and approve of the note as written.   MJanna Arch PT 04/11/2022, 12:21 PM

## 2022-04-12 ENCOUNTER — Ambulatory Visit (INDEPENDENT_AMBULATORY_CARE_PROVIDER_SITE_OTHER): Payer: PPO | Admitting: Urology

## 2022-04-12 VITALS — BP 136/77 | HR 51

## 2022-04-12 DIAGNOSIS — N3281 Overactive bladder: Secondary | ICD-10-CM

## 2022-04-12 DIAGNOSIS — L02224 Furuncle of groin: Secondary | ICD-10-CM | POA: Diagnosis not present

## 2022-04-12 LAB — BLADDER SCAN AMB NON-IMAGING: Scan Result: 0

## 2022-04-12 MED ORDER — OXYBUTYNIN CHLORIDE ER 10 MG PO TB24: 10.0000 mg | ORAL_TABLET | Freq: Every day | ORAL | 3 refills | Status: DC

## 2022-04-12 NOTE — Progress Notes (Signed)
04/12/2022 1:12 PM   [place] Mar 10, 1961 [id number]  Referring provider: Kirk Ruths, MD [facility] [place],  [place] [address]  Chief Complaint  Patient presents with   Follow-up    HPI: 61 year old female with personal history of MS who presents today for follow-up of OAB.  She has been managed consistently and extremely well on oxybutynin 10 mg XL.  She does occasionally has some dry mouth but otherwise no constipation, cognitive issues, or dry eyes.  She has been tolerating medication well.  PVR today is minimal.  No UTIs or any other symptoms.  She does mention that she has a boil in her left inguinal area which popped yesterday.  She wonders if she needs antibiotics.  She is not able to show me the area but denies any surrounding redness or warmth.   PMH: Past Medical History:  Diagnosis Date   Abdominal pain, right upper quadrant    Back pain    Calculus of kidney 12/09/2013   Chronic back pain    unspecified   Chronic left shoulder pain 0000000   Complication of anesthesia    Functional disorder of bladder    other   Galactorrhea 11/26/2014   Chronic    Hereditary and idiopathic neuropathy 08/19/2013   HPV test positive    Hypercholesteremia 08/19/2013   Hypertension    Incomplete bladder emptying    Microscopic hematuria    MS (multiple sclerosis) (HCC)    Muscle spasticity 05/21/2014   Nonspecific findings on examination of urine    other   Osteopenia    PONV (postoperative nausea and vomiting)    Status post laparoscopic supracervical hysterectomy 11/26/2014   Tobacco user 11/26/2014   Wrist fracture     Surgical History: Past Surgical History:  Procedure Laterality Date   bilateral tubal ligation  1996   BREAST CYST EXCISION Left 2002   FRACTURE SURGERY     KNEE SURGERY     right   LAPAROSCOPIC SUPRACERVICAL HYSTERECTOMY  08/05/2013   ORIF WRIST FRACTURE Left 01/17/2017   Procedure:  OPEN REDUCTION INTERNAL FIXATION (ORIF) WRIST FRACTURE;  Surgeon: Lovell Sheehan, MD;  Location: [facility];  Service: Orthopedics;  Laterality: Left;   RADIOLOGY WITH ANESTHESIA N/A 03/18/2020   Procedure: MRI WITH ANESTHESIA CERVICAL SPINE AND BRAIN  WITH AND WITHOUT CONTRAST;  Surgeon: Radiologist, Medication, MD;  Location: [place];  Service: Radiology;  Laterality: N/A;   TUBAL LIGATION Bilateral    VAGINAL HYSTERECTOMY  03/2006    Home Medications:  Allergies as of 04/12/2022       Reactions   Amlodipine Swelling   Betadine [povidone-iodine]    Povidone Iodine Rash        Medication List        Accurate as of April 12, 2022  1:12 PM. If you have any questions, ask your nurse or doctor.          acetaminophen 500 MG tablet Commonly known as: TYLENOL Take 500-1,000 mg by mouth every 6 (six) hours as needed for moderate pain or headache.   baclofen 20 MG tablet Commonly known as: LIORESAL Take 1 tablet (20 mg total) by mouth 3 (three) times daily.   Cholecalciferol 25 MCG (1000 UT) tablet Take 5,000 Units by mouth daily.   gabapentin 100 MG capsule Commonly known as: NEURONTIN Take 1 capsule (100 mg total) by mouth 2 (two) times daily.   Mayzent 2 MG Tabs Generic drug:  Siponimod Fumarate Take 2 mg by mouth daily.   oxybutynin 10 MG 24 hr tablet Commonly known as: DITROPAN-XL Take 1 tablet (10 mg total) by mouth daily.   telmisartan 40 MG tablet Commonly known as: MICARDIS Take 40 mg by mouth daily.   tiZANidine 4 MG tablet Commonly known as: ZANAFLEX One po qAM, one po qPM and 2 po qHS What changed:  how much to take how to take this when to take this additional instructions        Allergies:  Allergies  Allergen Reactions   Amlodipine Swelling   Betadine [Povidone-Iodine]    Povidone Iodine Rash    Family History: Family History  Problem Relation Age of Onset   Sickle cell trait Sister    Hypertension Sister    Supraventricular  tachycardia Sister    Hypertension Sister    Diabetes Maternal Grandmother    Liver cancer Maternal Grandmother    Diabetes Maternal Aunt    Breast cancer Maternal Aunt        great MAT   Ovarian cancer Paternal Grandmother    Nephrolithiasis Father    Hypertension Father    Prostate cancer Father    Kidney disease Father    Heart failure Father    Hypertension Sister    Osteoarthritis Mother    Hypertension Mother    Hyperlipidemia Mother    GU problems Neg Hx    Urolithiasis Neg Hx     Social History:  reports that she has quit smoking. Her smoking use included cigarettes. She smoked an average of .5 packs per day. She has never used smokeless tobacco. She reports that she does not drink alcohol and does not use drugs.   Physical Exam: BP 136/77   Pulse (!) 51   Constitutional:  Alert and oriented, No acute distress.  In wheel chair.   HEENT: [place] AT, moist mucus membranes.  Trachea midline, no masses. Cardiovascular: No clubbing, cyanosis, or edema. Respiratory: Normal respiratory effort, no increased work of breathing. Neurologic: Grossly intact, no focal deficits, moving all 4 extremities. Psychiatric: Normal mood and affect.  Laboratory Data: Lab Results  Component Value Date   WBC 5.9 02/01/2022   HGB 13.2 02/01/2022   HCT 40.7 02/01/2022   MCV 83 02/01/2022   PLT 250 02/01/2022    Lab Results  Component Value Date   CREATININE 0.85 11/20/2021    Lab Results  Component Value Date   HGBA1C 5.1 11/29/2016    Urinalysis    Component Value Date/Time   COLORURINE AMBER (A) 11/15/2021 0130   APPEARANCEUR HAZY (A) 11/15/2021 0130   APPEARANCEUR Clear 03/03/2021 1128   LABSPEC 1.029 11/15/2021 0130   LABSPEC 1.030 07/31/2011 1838   PHURINE 5.0 11/15/2021 0130   GLUCOSEU NEGATIVE 11/15/2021 0130   GLUCOSEU Negative 07/31/2011 1838   HGBUR NEGATIVE 11/15/2021 0130   BILIRUBINUR NEGATIVE 11/15/2021 0130   BILIRUBINUR Negative 03/03/2021 1128   BILIRUBINUR  Negative 07/31/2011 1838   KETONESUR 20 (A) 11/15/2021 0130   PROTEINUR NEGATIVE 11/15/2021 0130   NITRITE NEGATIVE 11/15/2021 0130   LEUKOCYTESUR NEGATIVE 11/15/2021 0130   LEUKOCYTESUR Negative 07/31/2011 1838    Lab Results  Component Value Date   LABMICR See below: 03/03/2021   WBCUA None seen 03/03/2021   LABEPIT 0-10 03/03/2021   BACTERIA NONE SEEN 11/15/2021    Pertinent Imaging: Results for orders placed or performed in visit on 04/12/22  Bladder Scan (Post Void Residual) in office  Result Value Ref Range  Scan Result 0 ml    *Note: Due to a large number of results and/or encounters for the requested time period, some results have not been displayed. A complete set of results can be found in Results Review.    Assessment & Plan:    1. OAB (overactive bladder) Well-controlled urinary symptoms on oxybutynin 10 mg XL  Given that she is tolerating the medication well and has been stable on this medication for a number of years, we discussed the option of continuing to follow with her primary care at this medication, return if her symptoms worsen or she develops side effects.  She is agreeable this plan. - Bladder Scan (Post Void Residual) in office  2. Boil of groin Not able to examine the patient and outside the scope of urologic practice.  Based on her description, it sounds like that the infection has necessitated and likely does not need antibiotics at this point.  Advised to follow-up with PCP if she develops any redness, warmth, or any other signs of infection.   Return if symptoms worsen or fail to improve.  Hollice Espy, MD  [facility] [address] [place], [place] [address] [phone]

## 2022-04-13 ENCOUNTER — Ambulatory Visit: Payer: PPO

## 2022-04-13 DIAGNOSIS — R278 Other lack of coordination: Secondary | ICD-10-CM

## 2022-04-13 DIAGNOSIS — R262 Difficulty in walking, not elsewhere classified: Secondary | ICD-10-CM

## 2022-04-13 DIAGNOSIS — R2681 Unsteadiness on feet: Secondary | ICD-10-CM

## 2022-04-13 DIAGNOSIS — M6281 Muscle weakness (generalized): Secondary | ICD-10-CM | POA: Diagnosis not present

## 2022-04-13 NOTE — Therapy (Signed)
OUTPATIENT PHYSICAL THERAPY NEURO TREATMENT  Patient Name: Lisa Williams MRN: [id number] DOB:10-28-1960, 61 y.o., female Today's Date: 04/13/2022  PCP: Kirk Ruths  REFERRING PROVIDER: Britt Bottom MD   PT End of Session - 04/13/22 1434     Visit Number 17    Number of Visits 24    Date for PT Re-Evaluation 05/09/22    Authorization Type 2/10 PN 03/16/22    PT Start Time 1015    PT Stop Time 1100    PT Time Calculation (min) 45 min    Equipment Utilized During Treatment Gait belt    Activity Tolerance Patient tolerated treatment well;Patient limited by fatigue    Behavior During Therapy [facility] for tasks assessed/performed            Past Medical History:  Diagnosis Date   Abdominal pain, right upper quadrant    Back pain    Calculus of kidney 12/09/2013   Chronic back pain    unspecified   Chronic left shoulder pain 9/37/3428   Complication of anesthesia    Functional disorder of bladder    other   Galactorrhea 11/26/2014   Chronic    Hereditary and idiopathic neuropathy 08/19/2013   HPV test positive    Hypercholesteremia 08/19/2013   Hypertension    Incomplete bladder emptying    Microscopic hematuria    MS (multiple sclerosis) (HCC)    Muscle spasticity 05/21/2014   Nonspecific findings on examination of urine    other   Osteopenia    PONV (postoperative nausea and vomiting)    Status post laparoscopic supracervical hysterectomy 11/26/2014   Tobacco user 11/26/2014   Wrist fracture    Past Surgical History:  Procedure Laterality Date   bilateral tubal ligation  1996   BREAST CYST EXCISION Left 2002   FRACTURE SURGERY     KNEE SURGERY     right   LAPAROSCOPIC SUPRACERVICAL HYSTERECTOMY  08/05/2013   ORIF WRIST FRACTURE Left 01/17/2017   Procedure: OPEN REDUCTION INTERNAL FIXATION (ORIF) WRIST FRACTURE;  Surgeon: Lovell Sheehan, MD;  Location: [facility];  Service: Orthopedics;  Laterality: Left;   RADIOLOGY WITH ANESTHESIA N/A 03/18/2020    Procedure: MRI WITH ANESTHESIA CERVICAL SPINE AND BRAIN  WITH AND WITHOUT CONTRAST;  Surgeon: Radiologist, Medication, MD;  Location: [place];  Service: Radiology;  Laterality: N/A;   TUBAL LIGATION Bilateral    VAGINAL HYSTERECTOMY  03/2006   Patient Active Problem List   Diagnosis Date Noted   Abscess of left groin 11/15/2021   Abnormal LFTs 11/15/2021   Acute respiratory disease due to COVID-19 virus 11/21/2020   Weakness    Hypoalbuminemia due to protein-calorie malnutrition [facility])    Neurogenic bowel    Neurogenic bladder    Labile blood pressure    Neuropathic pain    Abscess of female pelvis    SVT (supraventricular tachycardia)    Radial styloid tenosynovitis 03/12/2018   Wheelchair confinement 02/27/2018   Localized osteoporosis with current pathological fracture with routine healing 01/19/2017   Wrist fracture 01/16/2017   Sprain of ankle 03/23/2016   Closed fracture of lateral malleolus 03/16/2016   Health care maintenance 01/24/2016   Blood pressure elevated without history of HTN 10/25/2015   Essential hypertension 10/25/2015   Multiple sclerosis ([place]) 10/02/2015   Chronic left shoulder pain 07/19/2015   Multiple sclerosis exacerbation ([place]) 07/14/2015   MS (multiple sclerosis) ([place]) 11/26/2014   Increased body mass index 11/26/2014   HPV test positive 11/26/2014  Status post laparoscopic supracervical hysterectomy 11/26/2014   Galactorrhea 11/26/2014   Back ache 05/21/2014   Adiposity 05/21/2014   Disordered sleep 05/21/2014   Muscle spasticity 05/21/2014   Spasticity 05/21/2014   Calculus of kidney 40/02/2724   Renal colic 36/64/4034   Hypercholesteremia 08/19/2013   Hereditary and idiopathic neuropathy 08/19/2013   Hypercholesterolemia without hypertriglyceridemia 08/19/2013   Bladder infection, chronic 07/25/2012   Disorder of bladder function 07/25/2012   Incomplete bladder emptying 07/25/2012   Microscopic hematuria 07/25/2012   Right upper quadrant  pain 07/25/2012   ONSET DATE: 1995  REFERRING DIAG: MS  THERAPY DIAG:  Muscle weakness (generalized)  Unsteadiness on feet  Difficulty in walking, not elsewhere classified  Other lack of coordination  Rationale for Evaluation and Treatment Rehabilitation  SUBJECTIVE:                                                                                                                                                                                          SUBJECTIVE STATEMENT: The patient reports no significant changes since last visit and no falls. The patient reports they had a boil that bursted and they are currently taking antibiotics for it.    Pt accompanied by: self  PERTINENT HISTORY: Patient is retuning to PT after hospitalization on 11/14/21 where she then went to SNF and then received home health therapy which stopped 02/08/22. Patient has weakness in BLE with RLE>LLE. She drives with hand controls. Patient has been diagnosed with MS in 1995.  PMH includes: back pain, CBP, chronic L shoulder pain, galactorrhea, neuropathy, HPV, hypercholestermeia, HTN, MS, osteopenia, PONV, wrist fracture. Started back driving about two weeks ago.   PAIN:  Are you having pain? Yes: NPRS scale: 2/10 Pain location: abdomen to R leg Pain description: spasm Aggravating factors: unknown Relieving factors: n/a  PRECAUTIONS: Fall  WEIGHT BEARING RESTRICTIONS No  FALLS: Has patient fallen in last 6 months? No  LIVING ENVIRONMENT: Lives with: lives with their family Lives in: House/apartment Stairs: Yes: Internal: full flight steps; but lives fully on first floor Has following equipment at home: Gilford Rile - 2 wheeled, Wheelchair (manual), shower chair, Grab bars, and Ramped entry  PLOF: Independent with household mobility with device  PATIENT GOALS be able to get into the bed without assistance, improve ambulation, standing tolerance, going up steps.   OBJECTIVE:   DIAGNOSTIC FINDINGS: MRI of  the brain 03/18/2020 showed multiple T2/FLAIR hyperintense foci in the periventricular, juxtacortical and deep white matter.  There were no infratentorial lesions noted.  None of the foci enhanced.   Due to severe claustrophobia, the study was done with conscious sedation in the hospital  MRI of the cervical spine 03/18/2020 showed T2 hyperintense foci at C1-C2, C2-C3, C4-C5, C5-C6 and T1.  There is a disc protrusion at C5-C6 to the left and a larger disc protrusion at C6-C7 to the right that distorts the thecal sac.  The adjacent spinal cord has normal signal.  There could be compression of the right C7 nerve root.   LOWER EXTREMITY MMT:    MMT Right Eval Left Eval  Hip flexion 2- 2  Hip extension 2- 2  Hip abduction 2+ 2+  Hip adduction 2+ 2+  Knee flexion 2- 2  Knee extension 2 2  Ankle dorsiflexion trace trace  Ankle plantarflexion trace trace  (Blank rows = not tested)   TODAY'S TREATMENT:   There Ex:  Ambulation on Incline: Patient ambulated with a bariatric RW, CGA with close w/c follow. (Ice pack on back during rest breaks) 1x ambulation up incline (Incline split into 3 increments, 2 rest breaks required)  1x static stance on decline ramp (1x45 sec, 1x20 sec)  *Patient was fatigued on the second static stance and was unable to maintain stance safety)  Neuro Rehab:  Seated Blaze Pod Boxing: (2 pods held by SPT) - Punching the pods when they light up (random setting) 2x30 sec - Punching the pods, coordinate color with pod to punch (random setting) 2x30 sec (Red = Right hand Punch) (Blue = Left Hand Punch)  Seated on Dyna Disc Blaze Pod Boxing (4 pods in total = 2 pods held by SPT, 2 pods held by PT) - Punching the pods, (Distractor setting) only punching red color pod 2x30 sec  PATIENT EDUCATION: Education details: goals, POC, outcome measures Person educated: Patient Education method: Explanation, Demonstration, Tactile cues, and Verbal cues Education comprehension:  verbalized understanding, returned demonstration, verbal cues required, and tactile cues required   HOME EXERCISE PROGRAM: Give next session  GOALS: Goals reviewed with patient? Yes  SHORT TERM GOALS: Target date: 03/28/2022  Patient will be independent in home exercise program to improve strength/mobility for better functional independence with ADLs. Baseline:11/9: compliant Goal status: Partially Met  LONG TERM GOALS: Target date: 05/09/2022  Patient will increase FOTO score to equal to or greater than  52%   to demonstrate statistically significant improvement in mobility and quality of life Baseline: 10/10: 42% 11/9: 40% Goal status: in progress  2.  Patient (> 102 years old) will complete five times sit to stand test in < 15 seconds indicating an increased LE strength and improved balance. Baseline: 10/10: 38 seconds with one plop heavy UE support 11/9: 26 seconds with heavy BUE support  Goal status: In progress  3.  Patient will increase 10 meter walk test to >0.5 m/s as to improve gait speed for better community ambulation and to reduce fall risk. Baseline: 10/10: 54 seconds with bRW andw/c follow 11/9: 43.3 seconds with RW and w/c follow  Goal status: In progress  4.  Patient will be able to get into bed independently with use of railing to increase independence with mobility and nighttime routine.  Baseline: 10/10: needs complete assistance to bring RLE into bed. 11/9: needs assistance with leg;  Goal status: In progress  5.  Patient will go up 2 small steps to enter sisters house with min A.  Baseline: 10/10: unable to perform 11/9: unable to perform on 2 " step   Goal status: IN PROGRESS   ASSESSMENT:  CLINICAL IMPRESSION: The patient presented to therapy motivated to engage in therapeutic exercise and ready to  try ambulating on the incline ramp today. The patient performed great with incline ambulation and is able to ambulate greater distances on the incline without  rest breaks every session. The patient was also able to demonstrate increased core stability with blaze pod dual boxing activities and improved reaction time. The patient enjoyed dual tasking boxing and reported the mental aspect of the activity made it much more difficult for her which she enjoyed. Patient will continue to benefit from skilled physical therapy intervention to address impairments, improve QOL, and attain therapy goals.    OBJECTIVE IMPAIRMENTS Abnormal gait, decreased activity tolerance, decreased balance, decreased coordination, decreased endurance, decreased mobility, difficulty walking, decreased ROM, decreased strength, hypomobility, increased edema, impaired perceived functional ability, increased muscle spasms, impaired flexibility, impaired tone, impaired UE functional use, improper body mechanics, postural dysfunction, obesity, and pain.   ACTIVITY LIMITATIONS carrying, lifting, bending, standing, squatting, sleeping, stairs, transfers, bed mobility, bathing, toileting, dressing, reach over head, locomotion level, and caring for others  PARTICIPATION LIMITATIONS: meal prep, cleaning, laundry, shopping, community activity, and yard work  PERSONAL FACTORS Age, Fitness, Past/current experiences, Sex, Social background, Time since onset of injury/illness/exacerbation, Transportation, and 3+ comorbidities: back pain, CBP, chronic L shoulder pain, galactorrhea, neuropathy, HPV, hypercholestermeia, HTN, MS, osteopenia, PONV, wrist fracture  are also affecting patient's functional outcome.   REHAB POTENTIAL: Fair    CLINICAL DECISION MAKING: Evolving/moderate complexity  EVALUATION COMPLEXITY: Moderate  PLAN: PT FREQUENCY: 2x/week  PT DURATION: 12 weeks  PLANNED INTERVENTIONS: Therapeutic exercises, Therapeutic activity, Neuromuscular re-education, Balance training, Gait training, Patient/Family education, Self Care, Joint mobilization, Stair training, Vestibular training,  Canalith repositioning, Visual/preceptual remediation/compensation, Orthotic/Fit training, DME instructions, Dry Needling, Electrical stimulation, Wheelchair mobility training, Spinal mobilization, Cryotherapy, Moist heat, Taping, Vasopneumatic device, Traction, Ultrasound, Ionotophoresis 48m/ml Dexamethasone, Manual therapy, and Re-evaluation  PLAN FOR NEXT SESSION: standing balance and strengthening, work on ambulation of decline and standing balance with decline.    PSudie Bailey SPT  This entire session was performed under direct supervision and direction of a licensed therapist/therapist assistant . I have personally read, edited and approve of the note as written.   MJanna Arch PT 04/13/2022, 3:18 PM

## 2022-04-13 NOTE — Telephone Encounter (Signed)
Gave completed/signed form back to medical records to process for pt. 

## 2022-04-14 ENCOUNTER — Encounter: Payer: Self-pay | Admitting: Neurology

## 2022-04-18 ENCOUNTER — Ambulatory Visit: Payer: PPO

## 2022-04-18 ENCOUNTER — Telehealth: Payer: Self-pay | Admitting: Neurology

## 2022-04-18 DIAGNOSIS — R2689 Other abnormalities of gait and mobility: Secondary | ICD-10-CM

## 2022-04-18 DIAGNOSIS — R278 Other lack of coordination: Secondary | ICD-10-CM

## 2022-04-18 DIAGNOSIS — M6281 Muscle weakness (generalized): Secondary | ICD-10-CM

## 2022-04-18 DIAGNOSIS — R2681 Unsteadiness on feet: Secondary | ICD-10-CM

## 2022-04-18 NOTE — Telephone Encounter (Signed)
Forms mailed to [employer] ([address] [place], [place] [address])

## 2022-04-18 NOTE — Therapy (Signed)
OUTPATIENT PHYSICAL THERAPY NEURO TREATMENT  Patient Name: Lisa Williams MRN: [id number] DOB:01/15/1961, 61 y.o., female Today's Date: 04/18/2022  PCP: Kirk Ruths  REFERRING PROVIDER: Britt Bottom MD   PT End of Session - 04/18/22 1217     Visit Number 18    Number of Visits 24    Date for PT Re-Evaluation 05/09/22    Authorization Type 8/10 PN 03/16/22    PT Start Time 1015    PT Stop Time 1100    PT Time Calculation (min) 45 min    Equipment Utilized During Treatment Gait belt    Activity Tolerance Patient tolerated treatment well;Patient limited by fatigue    Behavior During Therapy [facility] for tasks assessed/performed            Past Medical History:  Diagnosis Date   Abdominal pain, right upper quadrant    Back pain    Calculus of kidney 12/09/2013   Chronic back pain    unspecified   Chronic left shoulder pain 06/27/2540   Complication of anesthesia    Functional disorder of bladder    other   Galactorrhea 11/26/2014   Chronic    Hereditary and idiopathic neuropathy 08/19/2013   HPV test positive    Hypercholesteremia 08/19/2013   Hypertension    Incomplete bladder emptying    Microscopic hematuria    MS (multiple sclerosis) (HCC)    Muscle spasticity 05/21/2014   Nonspecific findings on examination of urine    other   Osteopenia    PONV (postoperative nausea and vomiting)    Status post laparoscopic supracervical hysterectomy 11/26/2014   Tobacco user 11/26/2014   Wrist fracture    Past Surgical History:  Procedure Laterality Date   bilateral tubal ligation  1996   BREAST CYST EXCISION Left 2002   FRACTURE SURGERY     KNEE SURGERY     right   LAPAROSCOPIC SUPRACERVICAL HYSTERECTOMY  08/05/2013   ORIF WRIST FRACTURE Left 01/17/2017   Procedure: OPEN REDUCTION INTERNAL FIXATION (ORIF) WRIST FRACTURE;  Surgeon: Lovell Sheehan, MD;  Location: [facility];  Service: Orthopedics;  Laterality: Left;   RADIOLOGY WITH ANESTHESIA N/A 03/18/2020    Procedure: MRI WITH ANESTHESIA CERVICAL SPINE AND BRAIN  WITH AND WITHOUT CONTRAST;  Surgeon: Radiologist, Medication, MD;  Location: [facility];  Service: Radiology;  Laterality: N/A;   TUBAL LIGATION Bilateral    VAGINAL HYSTERECTOMY  03/2006   Patient Active Problem List   Diagnosis Date Noted   Abscess of left groin 11/15/2021   Abnormal LFTs 11/15/2021   Acute respiratory disease due to COVID-19 virus 11/21/2020   Weakness    Hypoalbuminemia due to protein-calorie malnutrition [facility])    Neurogenic bowel    Neurogenic bladder    Labile blood pressure    Neuropathic pain    Abscess of female pelvis    SVT (supraventricular tachycardia)    Radial styloid tenosynovitis 03/12/2018   Wheelchair confinement 02/27/2018   Localized osteoporosis with current pathological fracture with routine healing 01/19/2017   Wrist fracture 01/16/2017   Sprain of ankle 03/23/2016   Closed fracture of lateral malleolus 03/16/2016   Health care maintenance 01/24/2016   Blood pressure elevated without history of HTN 10/25/2015   Essential hypertension 10/25/2015   Multiple sclerosis ([place]) 10/02/2015   Chronic left shoulder pain 07/19/2015   Multiple sclerosis exacerbation ([address]) 07/14/2015   MS (multiple sclerosis) ([place]) 11/26/2014   Increased body mass index 11/26/2014   HPV test positive 11/26/2014  Status post laparoscopic supracervical hysterectomy 11/26/2014   Galactorrhea 11/26/2014   Back ache 05/21/2014   Adiposity 05/21/2014   Disordered sleep 05/21/2014   Muscle spasticity 05/21/2014   Spasticity 05/21/2014   Calculus of kidney 35/00/9381   Renal colic 82/99/3716   Hypercholesteremia 08/19/2013   Hereditary and idiopathic neuropathy 08/19/2013   Hypercholesterolemia without hypertriglyceridemia 08/19/2013   Bladder infection, chronic 07/25/2012   Disorder of bladder function 07/25/2012   Incomplete bladder emptying 07/25/2012   Microscopic hematuria 07/25/2012   Right upper quadrant  pain 07/25/2012   ONSET DATE: 1995  REFERRING DIAG: MS  THERAPY DIAG:  Muscle weakness (generalized)  Other lack of coordination  Other abnormalities of gait and mobility  Unsteadiness on feet  Rationale for Evaluation and Treatment Rehabilitation  SUBJECTIVE:                                                                                                                                                                                          SUBJECTIVE STATEMENT: The patient reports they were experiencing some MS exacerbations last week following a new boil infection but are feeling a lot better this week.  Other then that the patient reports no significant changes since last visit and no falls.     Pt accompanied by: self  PERTINENT HISTORY: Patient is retuning to PT after hospitalization on 11/14/21 where she then went to SNF and then received home health therapy which stopped 02/08/22. Patient has weakness in BLE with RLE>LLE. She drives with hand controls. Patient has been diagnosed with MS in 1995.  PMH includes: back pain, CBP, chronic L shoulder pain, galactorrhea, neuropathy, HPV, hypercholestermeia, HTN, MS, osteopenia, PONV, wrist fracture. Started back driving about two weeks ago.   PAIN:  Are you having pain? Yes: NPRS scale: 2/10 Pain location: abdomen to R leg Pain description: spasm Aggravating factors: unknown Relieving factors: n/a  PRECAUTIONS: Fall  WEIGHT BEARING RESTRICTIONS No  FALLS: Has patient fallen in last 6 months? No  LIVING ENVIRONMENT: Lives with: lives with their family Lives in: House/apartment Stairs: Yes: Internal: full flight steps; but lives fully on first floor Has following equipment at home: Gilford Rile - 2 wheeled, Wheelchair (manual), shower chair, Grab bars, and Ramped entry  PLOF: Independent with household mobility with device  PATIENT GOALS be able to get into the bed without assistance, improve ambulation, standing tolerance,  going up steps.   OBJECTIVE:   DIAGNOSTIC FINDINGS: MRI of the brain 03/18/2020 showed multiple T2/FLAIR hyperintense foci in the periventricular, juxtacortical and deep white matter.  There were no infratentorial lesions noted.  None of the foci enhanced.   Due to  severe claustrophobia, the study was done with conscious sedation in the hospital  MRI of the cervical spine 03/18/2020 showed T2 hyperintense foci at C1-C2, C2-C3, C4-C5, C5-C6 and T1.  There is a disc protrusion at C5-C6 to the left and a larger disc protrusion at C6-C7 to the right that distorts the thecal sac.  The adjacent spinal cord has normal signal.  There could be compression of the right C7 nerve root.   LOWER EXTREMITY MMT:    MMT Right Eval Left Eval  Hip flexion 2- 2  Hip extension 2- 2  Hip abduction 2+ 2+  Hip adduction 2+ 2+  Knee flexion 2- 2  Knee extension 2 2  Ankle dorsiflexion trace trace  Ankle plantarflexion trace trace  (Blank rows = not tested)   TODAY'S TREATMENT:   There Ex: Matrix Machine Seated Strengthening:  - Matrix Machine Rows: 12.5 lbs 2x10 - Matrix Machine Pull Down: 12.5 lbs 1x10 - Pallof Press: 7.5 lbs 2x10 each side   Neuro Rehab:  Seated Blaze Pod Functional Reach Taps: (Seated Dyna Disc and CGA from PT behind patient)   - Functional reach taps (6 pods, distractor setting) 2x30 seconds   - Functional reach taps dual tasking, associated color with activity (4 pods, random setting, 2 colors and conditions) 2 trials of 10 hits   Green = Left Hand High Five  Red = Right Hand High Five   - Functional reach taps dual tasking, associated color with activity (4 pods, random setting, 3 colors and conditions) 2 trials of 10 hits   Green = Left Hand High Five  Red = Right Hand High Five  Yellow = Clap   - Functional reach taps dual tasking competition vs SPT (2 stations, distractor setting, 2 games first to 10 hits, 2 games most hits in 30 seconds, 3 pods per person)   PATIENT  EDUCATION: Education details: goals, POC, outcome measures Person educated: Patient Education method: Explanation, Demonstration, Tactile cues, and Verbal cues Education comprehension: verbalized understanding, returned demonstration, verbal cues required, and tactile cues required   HOME EXERCISE PROGRAM: Give next session  GOALS: Goals reviewed with patient? Yes  SHORT TERM GOALS: Target date: 03/28/2022  Patient will be independent in home exercise program to improve strength/mobility for better functional independence with ADLs. Baseline:11/9: compliant Goal status: Partially Met  LONG TERM GOALS: Target date: 05/09/2022  Patient will increase FOTO score to equal to or greater than  52%   to demonstrate statistically significant improvement in mobility and quality of life Baseline: 10/10: 42% 11/9: 40% Goal status: in progress  2.  Patient (> 93 years old) will complete five times sit to stand test in < 15 seconds indicating an increased LE strength and improved balance. Baseline: 10/10: 38 seconds with one plop heavy UE support 11/9: 26 seconds with heavy BUE support  Goal status: In progress  3.  Patient will increase 10 meter walk test to >0.5 m/s as to improve gait speed for better community ambulation and to reduce fall risk. Baseline: 10/10: 54 seconds with bRW andw/c follow 11/9: 43.3 seconds with RW and w/c follow  Goal status: In progress  4.  Patient will be able to get into bed independently with use of railing to increase independence with mobility and nighttime routine.  Baseline: 10/10: needs complete assistance to bring RLE into bed. 11/9: needs assistance with leg;  Goal status: In progress  5.  Patient will go up 2 small steps to enter sisters house  with min A.  Baseline: 10/10: unable to perform 11/9: unable to perform on 2 " step   Goal status: IN PROGRESS   ASSESSMENT:  CLINICAL IMPRESSION: The patient presented to therapy motivated to engage in  therapeutic exercise. The patient has enjoyed dual tasking activities with the blaze pods and continues to report the added mental aspect really challenges her both mentally and physically. The patient continues to demonstrate increased core stability with blaze pod dual activities and improved reaction time every time using them. The patient was able to perform all blaze pod activities while seated on a dyna disc with no issues or pain. Standing activities where not performed today to prevent further aggravation of MS exacerbations last week. Patient will continue to benefit from skilled physical therapy intervention to address impairments, improve QOL, and attain therapy goals.    OBJECTIVE IMPAIRMENTS Abnormal gait, decreased activity tolerance, decreased balance, decreased coordination, decreased endurance, decreased mobility, difficulty walking, decreased ROM, decreased strength, hypomobility, increased edema, impaired perceived functional ability, increased muscle spasms, impaired flexibility, impaired tone, impaired UE functional use, improper body mechanics, postural dysfunction, obesity, and pain.   ACTIVITY LIMITATIONS carrying, lifting, bending, standing, squatting, sleeping, stairs, transfers, bed mobility, bathing, toileting, dressing, reach over head, locomotion level, and caring for others  PARTICIPATION LIMITATIONS: meal prep, cleaning, laundry, shopping, community activity, and yard work  PERSONAL FACTORS Age, Fitness, Past/current experiences, Sex, Social background, Time since onset of injury/illness/exacerbation, Transportation, and 3+ comorbidities: back pain, CBP, chronic L shoulder pain, galactorrhea, neuropathy, HPV, hypercholestermeia, HTN, MS, osteopenia, PONV, wrist fracture  are also affecting patient's functional outcome.   REHAB POTENTIAL: Fair    CLINICAL DECISION MAKING: Evolving/moderate complexity  EVALUATION COMPLEXITY: Moderate  PLAN: PT FREQUENCY: 2x/week  PT  DURATION: 12 weeks  PLANNED INTERVENTIONS: Therapeutic exercises, Therapeutic activity, Neuromuscular re-education, Balance training, Gait training, Patient/Family education, Self Care, Joint mobilization, Stair training, Vestibular training, Canalith repositioning, Visual/preceptual remediation/compensation, Orthotic/Fit training, DME instructions, Dry Needling, Electrical stimulation, Wheelchair mobility training, Spinal mobilization, Cryotherapy, Moist heat, Taping, Vasopneumatic device, Traction, Ultrasound, Ionotophoresis 48m/ml Dexamethasone, Manual therapy, and Re-evaluation  PLAN FOR NEXT SESSION: standing balance and strengthening, work on ambulation of decline and standing balance with decline.    PSudie Bailey SPT  This entire session was performed under direct supervision and direction of a licensed therapist/therapist assistant . I have personally read, edited and approve of the note as written.   MJanna Arch PT 04/18/2022, 12:48 PM

## 2022-04-20 ENCOUNTER — Ambulatory Visit: Payer: PPO

## 2022-04-20 DIAGNOSIS — R262 Difficulty in walking, not elsewhere classified: Secondary | ICD-10-CM

## 2022-04-20 DIAGNOSIS — R269 Unspecified abnormalities of gait and mobility: Secondary | ICD-10-CM

## 2022-04-20 DIAGNOSIS — R278 Other lack of coordination: Secondary | ICD-10-CM

## 2022-04-20 DIAGNOSIS — M6281 Muscle weakness (generalized): Secondary | ICD-10-CM

## 2022-04-20 NOTE — Therapy (Signed)
OUTPATIENT PHYSICAL THERAPY NEURO TREATMENT  Patient Name: Lisa Williams MRN: [id number] DOB:October 23, 1960, 61 y.o., female Today's Date: 04/20/2022  PCP: Kirk Ruths  REFERRING PROVIDER: Britt Bottom MD   PT End of Session - 04/20/22 1206     Visit Number 19    Number of Visits 24    Date for PT Re-Evaluation 05/09/22    Authorization Type 8/10 PN 03/16/22    PT Start Time 1015    PT Stop Time 1055    PT Time Calculation (min) 40 min    Equipment Utilized During Treatment Gait belt    Activity Tolerance Patient tolerated treatment well;Patient limited by fatigue    Behavior During Therapy [facility] for tasks assessed/performed            Past Medical History:  Diagnosis Date   Abdominal pain, right upper quadrant    Back pain    Calculus of kidney 12/09/2013   Chronic back pain    unspecified   Chronic left shoulder pain 6/73/4193   Complication of anesthesia    Functional disorder of bladder    other   Galactorrhea 11/26/2014   Chronic    Hereditary and idiopathic neuropathy 08/19/2013   HPV test positive    Hypercholesteremia 08/19/2013   Hypertension    Incomplete bladder emptying    Microscopic hematuria    MS (multiple sclerosis) (HCC)    Muscle spasticity 05/21/2014   Nonspecific findings on examination of urine    other   Osteopenia    PONV (postoperative nausea and vomiting)    Status post laparoscopic supracervical hysterectomy 11/26/2014   Tobacco user 11/26/2014   Wrist fracture    Past Surgical History:  Procedure Laterality Date   bilateral tubal ligation  1996   BREAST CYST EXCISION Left 2002   FRACTURE SURGERY     KNEE SURGERY     right   LAPAROSCOPIC SUPRACERVICAL HYSTERECTOMY  08/05/2013   ORIF WRIST FRACTURE Left 01/17/2017   Procedure: OPEN REDUCTION INTERNAL FIXATION (ORIF) WRIST FRACTURE;  Surgeon: Lovell Sheehan, MD;  Location: [facility];  Service: Orthopedics;  Laterality: Left;   RADIOLOGY WITH ANESTHESIA N/A 03/18/2020    Procedure: MRI WITH ANESTHESIA CERVICAL SPINE AND BRAIN  WITH AND WITHOUT CONTRAST;  Surgeon: Radiologist, Medication, MD;  Location: [place];  Service: Radiology;  Laterality: N/A;   TUBAL LIGATION Bilateral    VAGINAL HYSTERECTOMY  03/2006   Patient Active Problem List   Diagnosis Date Noted   Abscess of left groin 11/15/2021   Abnormal LFTs 11/15/2021   Acute respiratory disease due to COVID-19 virus 11/21/2020   Weakness    Hypoalbuminemia due to protein-calorie malnutrition [facility])    Neurogenic bowel    Neurogenic bladder    Labile blood pressure    Neuropathic pain    Abscess of female pelvis    SVT (supraventricular tachycardia)    Radial styloid tenosynovitis 03/12/2018   Wheelchair confinement 02/27/2018   Localized osteoporosis with current pathological fracture with routine healing 01/19/2017   Wrist fracture 01/16/2017   Sprain of ankle 03/23/2016   Closed fracture of lateral malleolus 03/16/2016   Health care maintenance 01/24/2016   Blood pressure elevated without history of HTN 10/25/2015   Essential hypertension 10/25/2015   Multiple sclerosis ([place]) 10/02/2015   Chronic left shoulder pain 07/19/2015   Multiple sclerosis exacerbation ([place]) 07/14/2015   MS (multiple sclerosis) ([place]) 11/26/2014   Increased body mass index 11/26/2014   HPV test positive 11/26/2014  Status post laparoscopic supracervical hysterectomy 11/26/2014   Galactorrhea 11/26/2014   Back ache 05/21/2014   Adiposity 05/21/2014   Disordered sleep 05/21/2014   Muscle spasticity 05/21/2014   Spasticity 05/21/2014   Calculus of kidney 82/50/5397   Renal colic 67/34/1937   Hypercholesteremia 08/19/2013   Hereditary and idiopathic neuropathy 08/19/2013   Hypercholesterolemia without hypertriglyceridemia 08/19/2013   Bladder infection, chronic 07/25/2012   Disorder of bladder function 07/25/2012   Incomplete bladder emptying 07/25/2012   Microscopic hematuria 07/25/2012   Right upper quadrant  pain 07/25/2012   ONSET DATE: 1995  REFERRING DIAG: MS  THERAPY DIAG:  Muscle weakness (generalized)  Other lack of coordination  Difficulty in walking, not elsewhere classified  Abnormality of gait and mobility  Rationale for Evaluation and Treatment Rehabilitation  SUBJECTIVE:                                                                                                                                                                                          SUBJECTIVE STATEMENT: The patient reports they have been feeling a lot better since starting their antibiotics. The patient reports no significant changes since last visit and no falls.   Pt accompanied by: self  PERTINENT HISTORY: Patient is retuning to PT after hospitalization on 11/14/21 where she then went to SNF and then received home health therapy which stopped 02/08/22. Patient has weakness in BLE with RLE>LLE. She drives with hand controls. Patient has been diagnosed with MS in 1995.  PMH includes: back pain, CBP, chronic L shoulder pain, galactorrhea, neuropathy, HPV, hypercholestermeia, HTN, MS, osteopenia, PONV, wrist fracture. Started back driving about two weeks ago.   PAIN:  Are you having pain? Yes: NPRS scale: 2/10 Pain location: abdomen to R leg Pain description: spasm Aggravating factors: unknown Relieving factors: n/a  PRECAUTIONS: Fall  WEIGHT BEARING RESTRICTIONS No  FALLS: Has patient fallen in last 6 months? No  LIVING ENVIRONMENT: Lives with: lives with their family Lives in: House/apartment Stairs: Yes: Internal: full flight steps; but lives fully on first floor Has following equipment at home: Gilford Rile - 2 wheeled, Wheelchair (manual), shower chair, Grab bars, and Ramped entry  PLOF: Independent with household mobility with device  PATIENT GOALS be able to get into the bed without assistance, improve ambulation, standing tolerance, going up steps.   OBJECTIVE:   DIAGNOSTIC FINDINGS: MRI  of the brain 03/18/2020 showed multiple T2/FLAIR hyperintense foci in the periventricular, juxtacortical and deep white matter.  There were no infratentorial lesions noted.  None of the foci enhanced.   Due to severe claustrophobia, the study was done with conscious sedation in the hospital  MRI  of the cervical spine 03/18/2020 showed T2 hyperintense foci at C1-C2, C2-C3, C4-C5, C5-C6 and T1.  There is a disc protrusion at C5-C6 to the left and a larger disc protrusion at C6-C7 to the right that distorts the thecal sac.  The adjacent spinal cord has normal signal.  There could be compression of the right C7 nerve root.   LOWER EXTREMITY MMT:    MMT Right Eval Left Eval  Hip flexion 2- 2  Hip extension 2- 2  Hip abduction 2+ 2+  Hip adduction 2+ 2+  Knee flexion 2- 2  Knee extension 2 2  Ankle dorsiflexion trace trace  Ankle plantarflexion trace trace  (Blank rows = not tested)   TODAY'S TREATMENT:   There Ex: Ambulation in hallway: 91 ft (CGA, Bariatric Walker, wheelchair follow)   *Patient required 3 seated rest breaks*  Matrix Machine Seated Strengthening:  - Matrix Machine Rows: 12.5 lbs 2x10 - Matrix Machine Pull Down: 12.5 lbs 2x10 - Pallof Press: 7.5 lbs 1x10 each side   Neuro Rehab:  Seated on dyna disc boxing: - Right punch, Left punch cross body combo 10x - Right, right, left punch 10x  - Left, left, right punch 10x - Right punch, left punch, overhead dodge 10x  - Left punch, right punch, overhead dodge 10x  - Right punch, left punch, hands up to dodge 10x  - Left punch, left punch, hands up to dodge 10x  - Alternating punches, double hand uppercut, double hand slam down 10x    PATIENT EDUCATION: Education details: goals, POC, outcome measures Person educated: Patient Education method: Explanation, Demonstration, Tactile cues, and Verbal cues Education comprehension: verbalized understanding, returned demonstration, verbal cues required, and tactile cues  required   HOME EXERCISE PROGRAM: Give next session  GOALS: Goals reviewed with patient? Yes  SHORT TERM GOALS: Target date: 03/28/2022  Patient will be independent in home exercise program to improve strength/mobility for better functional independence with ADLs. Baseline:11/9: compliant Goal status: Partially Met  LONG TERM GOALS: Target date: 05/09/2022  Patient will increase FOTO score to equal to or greater than  52%   to demonstrate statistically significant improvement in mobility and quality of life Baseline: 10/10: 42% 11/9: 40% Goal status: in progress  2.  Patient (> 83 years old) will complete five times sit to stand test in < 15 seconds indicating an increased LE strength and improved balance. Baseline: 10/10: 38 seconds with one plop heavy UE support 11/9: 26 seconds with heavy BUE support  Goal status: In progress  3.  Patient will increase 10 meter walk test to >0.5 m/s as to improve gait speed for better community ambulation and to reduce fall risk. Baseline: 10/10: 54 seconds with bRW andw/c follow 11/9: 43.3 seconds with RW and w/c follow  Goal status: In progress  4.  Patient will be able to get into bed independently with use of railing to increase independence with mobility and nighttime routine.  Baseline: 10/10: needs complete assistance to bring RLE into bed. 11/9: needs assistance with leg;  Goal status: In progress  5.  Patient will go up 2 small steps to enter sisters house with min A.  Baseline: 10/10: unable to perform 11/9: unable to perform on 2 " step   Goal status: IN PROGRESS   ASSESSMENT:  CLINICAL IMPRESSION: The patient presented to therapy motivated to engage in therapeutic exercise. The patient was able to ambulate 91 feet with only 3 rest breaks today and demonstrated improved functional capacity  since starting on her antibiotics. The patient continues to demonstrate increased core stability with boxing while seated on a dyna disc. The  patient was able to perform all seated boxing activities on a dyna disc with no issues or pain. Ambulation was stopped after 91 ft to prevent further aggravation of MS exacerbations last week. Patient will continue to benefit from skilled physical therapy intervention to address impairments, improve QOL, and attain therapy goals.    OBJECTIVE IMPAIRMENTS Abnormal gait, decreased activity tolerance, decreased balance, decreased coordination, decreased endurance, decreased mobility, difficulty walking, decreased ROM, decreased strength, hypomobility, increased edema, impaired perceived functional ability, increased muscle spasms, impaired flexibility, impaired tone, impaired UE functional use, improper body mechanics, postural dysfunction, obesity, and pain.   ACTIVITY LIMITATIONS carrying, lifting, bending, standing, squatting, sleeping, stairs, transfers, bed mobility, bathing, toileting, dressing, reach over head, locomotion level, and caring for others  PARTICIPATION LIMITATIONS: meal prep, cleaning, laundry, shopping, community activity, and yard work  PERSONAL FACTORS Age, Fitness, Past/current experiences, Sex, Social background, Time since onset of injury/illness/exacerbation, Transportation, and 3+ comorbidities: back pain, CBP, chronic L shoulder pain, galactorrhea, neuropathy, HPV, hypercholestermeia, HTN, MS, osteopenia, PONV, wrist fracture  are also affecting patient's functional outcome.   REHAB POTENTIAL: Fair    CLINICAL DECISION MAKING: Evolving/moderate complexity  EVALUATION COMPLEXITY: Moderate  PLAN: PT FREQUENCY: 2x/week  PT DURATION: 12 weeks  PLANNED INTERVENTIONS: Therapeutic exercises, Therapeutic activity, Neuromuscular re-education, Balance training, Gait training, Patient/Family education, Self Care, Joint mobilization, Stair training, Vestibular training, Canalith repositioning, Visual/preceptual remediation/compensation, Orthotic/Fit training, DME instructions, Dry  Needling, Electrical stimulation, Wheelchair mobility training, Spinal mobilization, Cryotherapy, Moist heat, Taping, Vasopneumatic device, Traction, Ultrasound, Ionotophoresis 6m/ml Dexamethasone, Manual therapy, and Re-evaluation  PLAN FOR NEXT SESSION: standing balance and strengthening, work on ambulation of decline and standing balance with decline.    PSudie Bailey SPT  This entire session was performed under direct supervision and direction of a licensed therapist/therapist assistant . I have personally read, edited and approve of the note as written.   MJanna Arch PT 04/20/2022, 12:11 PM

## 2022-04-24 NOTE — Therapy (Signed)
OUTPATIENT PHYSICAL THERAPY NEURO TREATMENT/Physical Therapy Progress Note   Dates of reporting period  03/16/22   to   04/25/22   Patient Name: Eletha Culbertson Stoney MRN: [id number] DOB:12/13/1960, 61 y.o., female Today's Date: 04/25/2022  PCP: Kirk Ruths  REFERRING PROVIDER: Britt Bottom MD   PT End of Session - 04/25/22 1108     Visit Number 20    Number of Visits 24    Date for PT Re-Evaluation 05/09/22    Authorization Type next session 1/10 PN 12/19    PT Start Time 1015    PT Stop Time 1100    PT Time Calculation (min) 45 min    Equipment Utilized During Treatment Gait belt    Activity Tolerance Patient tolerated treatment well;Patient limited by fatigue    Behavior During Therapy [facility] for tasks assessed/performed             Past Medical History:  Diagnosis Date   Abdominal pain, right upper quadrant    Back pain    Calculus of kidney 12/09/2013   Chronic back pain    unspecified   Chronic left shoulder pain 7/82/9562   Complication of anesthesia    Functional disorder of bladder    other   Galactorrhea 11/26/2014   Chronic    Hereditary and idiopathic neuropathy 08/19/2013   HPV test positive    Hypercholesteremia 08/19/2013   Hypertension    Incomplete bladder emptying    Microscopic hematuria    MS (multiple sclerosis) (HCC)    Muscle spasticity 05/21/2014   Nonspecific findings on examination of urine    other   Osteopenia    PONV (postoperative nausea and vomiting)    Status post laparoscopic supracervical hysterectomy 11/26/2014   Tobacco user 11/26/2014   Wrist fracture    Past Surgical History:  Procedure Laterality Date   bilateral tubal ligation  1996   BREAST CYST EXCISION Left 2002   FRACTURE SURGERY     KNEE SURGERY     right   LAPAROSCOPIC SUPRACERVICAL HYSTERECTOMY  08/05/2013   ORIF WRIST FRACTURE Left 01/17/2017   Procedure: OPEN REDUCTION INTERNAL FIXATION (ORIF) WRIST FRACTURE;  Surgeon: Lovell Sheehan, MD;   Location: [facility];  Service: Orthopedics;  Laterality: Left;   RADIOLOGY WITH ANESTHESIA N/A 03/18/2020   Procedure: MRI WITH ANESTHESIA CERVICAL SPINE AND BRAIN  WITH AND WITHOUT CONTRAST;  Surgeon: Radiologist, Medication, MD;  Location: [facility];  Service: Radiology;  Laterality: N/A;   TUBAL LIGATION Bilateral    VAGINAL HYSTERECTOMY  03/2006   Patient Active Problem List   Diagnosis Date Noted   Abscess of left groin 11/15/2021   Abnormal LFTs 11/15/2021   Acute respiratory disease due to COVID-19 virus 11/21/2020   Weakness    Hypoalbuminemia due to protein-calorie malnutrition (HCC)    Neurogenic bowel    Neurogenic bladder    Labile blood pressure    Neuropathic pain    Abscess of female pelvis    SVT (supraventricular tachycardia)    Radial styloid tenosynovitis 03/12/2018   Wheelchair confinement 02/27/2018   Localized osteoporosis with current pathological fracture with routine healing 01/19/2017   Wrist fracture 01/16/2017   Sprain of ankle 03/23/2016   Closed fracture of lateral malleolus 03/16/2016   Health care maintenance 01/24/2016   Blood pressure elevated without history of HTN 10/25/2015   Essential hypertension 10/25/2015   Multiple sclerosis ([place]) 10/02/2015   Chronic left shoulder pain 07/19/2015   Multiple sclerosis exacerbation ([place]) 07/14/2015  MS (multiple sclerosis) ([place]) 11/26/2014   Increased body mass index 11/26/2014   HPV test positive 11/26/2014   Status post laparoscopic supracervical hysterectomy 11/26/2014   Galactorrhea 11/26/2014   Back ache 05/21/2014   Adiposity 05/21/2014   Disordered sleep 05/21/2014   Muscle spasticity 05/21/2014   Spasticity 05/21/2014   Calculus of kidney 67/04/4579   Renal colic 99/83/3825   Hypercholesteremia 08/19/2013   Hereditary and idiopathic neuropathy 08/19/2013   Hypercholesterolemia without hypertriglyceridemia 08/19/2013   Bladder infection, chronic 07/25/2012   Disorder of bladder function  07/25/2012   Incomplete bladder emptying 07/25/2012   Microscopic hematuria 07/25/2012   Right upper quadrant pain 07/25/2012   ONSET DATE: 1995  REFERRING DIAG: MS  THERAPY DIAG:  Muscle weakness (generalized)  Difficulty in walking, not elsewhere classified  Unsteadiness on feet  Rationale for Evaluation and Treatment Rehabilitation  SUBJECTIVE:                                                                                                                                                                                          SUBJECTIVE STATEMENT: Patient presents with spasms throughout her R side of the body affecting her leg.  .   Pt accompanied by: self  PERTINENT HISTORY: Patient is retuning to PT after hospitalization on 11/14/21 where she then went to SNF and then received home health therapy which stopped 02/08/22. Patient has weakness in BLE with RLE>LLE. She drives with hand controls. Patient has been diagnosed with MS in 1995.  PMH includes: back pain, CBP, chronic L shoulder pain, galactorrhea, neuropathy, HPV, hypercholestermeia, HTN, MS, osteopenia, PONV, wrist fracture. Started back driving about two weeks ago.   PAIN:  Are you having pain? Yes: NPRS scale: 2/10 Pain location: abdomen to R leg Pain description: spasm Aggravating factors: unknown Relieving factors: n/a  PRECAUTIONS: Fall  WEIGHT BEARING RESTRICTIONS No  FALLS: Has patient fallen in last 6 months? No  LIVING ENVIRONMENT: Lives with: lives with their family Lives in: House/apartment Stairs: Yes: Internal: full flight steps; but lives fully on first floor Has following equipment at home: Gilford Rile - 2 wheeled, Wheelchair (manual), shower chair, Grab bars, and Ramped entry  PLOF: Independent with household mobility with device  PATIENT GOALS be able to get into the bed without assistance, improve ambulation, standing tolerance, going up steps.   OBJECTIVE:   DIAGNOSTIC FINDINGS: MRI of the  brain 03/18/2020 showed multiple T2/FLAIR hyperintense foci in the periventricular, juxtacortical and deep white matter.  There were no infratentorial lesions noted.  None of the foci enhanced.   Due to severe claustrophobia, the study was done with conscious sedation in the  hospital  MRI of the cervical spine 03/18/2020 showed T2 hyperintense foci at C1-C2, C2-C3, C4-C5, C5-C6 and T1.  There is a disc protrusion at C5-C6 to the left and a larger disc protrusion at C6-C7 to the right that distorts the thecal sac.  The adjacent spinal cord has normal signal.  There could be compression of the right C7 nerve root.   LOWER EXTREMITY MMT:    MMT Right Eval Left Eval  Hip flexion 2- 2  Hip extension 2- 2  Hip abduction 2+ 2+  Hip adduction 2+ 2+  Knee flexion 2- 2  Knee extension 2 2  Ankle dorsiflexion trace trace  Ankle plantarflexion trace trace  (Blank rows = not tested)   TODAY'S TREATMENT:  Goals performed: see below for details   Seated boxing: cues for sequencing, body mechanics, and pace/rhythm for functional contraction and timing of muscle recruitment: -Cross body punches to mitts on PT hands with no back support for focused core stabilization with crossing midline 2x 60 seconds  -Cross body punch with secondary combination of elbow for full cross body rotation to PT mitt for trunk stability, coordination, and cardiovascular challenge x 60 seconds -Upper cut to mitts in PT hands for core activation without back support with perturbations 60 seconds  Seated: RTB abduction 10x Forward trunk lean and row with PT hands 10x ; 2 sets Lateral trunk lean for spasm reduction 8x each side   PATIENT EDUCATION: Education details: goals, POC, outcome measures Person educated: Patient Education method: Explanation, Demonstration, Tactile cues, and Verbal cues Education comprehension: verbalized understanding, returned demonstration, verbal cues required, and tactile cues  required   HOME EXERCISE PROGRAM: Give next session  GOALS: Goals reviewed with patient? Yes  SHORT TERM GOALS: Target date: 03/28/2022  Patient will be independent in home exercise program to improve strength/mobility for better functional independence with ADLs. Baseline:11/9: compliant 12/19: HEP compliant  Goal status: Partially Met  LONG TERM GOALS: Target date: 05/09/2022  Patient will increase FOTO score to equal to or greater than  52%   to demonstrate statistically significant improvement in mobility and quality of life Baseline: 10/10: 42% 11/9: 40% 12/19: 40%  Goal status: in progress  2.  Patient (> 78 years old) will complete five times sit to stand test in < 15 seconds indicating an increased LE strength and improved balance. Baseline: 10/10: 38 seconds with one plop heavy UE support 11/9: 26 seconds with heavy BUE support 12/19: 37 seconds with BUE support Goal status: In progress  3.  Patient will increase 10 meter walk test to >0.5 m/s as to improve gait speed for better community ambulation and to reduce fall risk. Baseline: 10/10: 54 seconds with bRW andw/c follow 11/9: 43.3 seconds with RW and w/c follow 12/19: 40.9 seconds with RW and w/c follow  Goal status: In progress  4.  Patient will be able to get into bed independently with use of railing to increase independence with mobility and nighttime routine.  Baseline: 10/10: needs complete assistance to bring RLE into bed. 11/9: needs assistance with leg; 12/19: min a for RLE  Goal status: In progress  5.  Patient will go up 2 small steps to enter sisters house with min A.  Baseline: 10/10: unable to perform 11/9: unable to perform on 2 " step  12/18: unable to perform  Goal status: IN PROGRESS   ASSESSMENT:  CLINICAL IMPRESSION: Patient does have increased time for transfers due to spasms in her body affecting R  side impacting her goals. However even with spasms she did increase her 10 MWT test time.   Patient's condition has the potential to improve in response to therapy. Maximum improvement is yet to be obtained. The anticipated improvement is attainable and reasonable in a generally predictable time.   Patient will continue to benefit from skilled physical therapy intervention to address impairments, improve QOL, and attain therapy goals.    OBJECTIVE IMPAIRMENTS Abnormal gait, decreased activity tolerance, decreased balance, decreased coordination, decreased endurance, decreased mobility, difficulty walking, decreased ROM, decreased strength, hypomobility, increased edema, impaired perceived functional ability, increased muscle spasms, impaired flexibility, impaired tone, impaired UE functional use, improper body mechanics, postural dysfunction, obesity, and pain.   ACTIVITY LIMITATIONS carrying, lifting, bending, standing, squatting, sleeping, stairs, transfers, bed mobility, bathing, toileting, dressing, reach over head, locomotion level, and caring for others  PARTICIPATION LIMITATIONS: meal prep, cleaning, laundry, shopping, community activity, and yard work  PERSONAL FACTORS Age, Fitness, Past/current experiences, Sex, Social background, Time since onset of injury/illness/exacerbation, Transportation, and 3+ comorbidities: back pain, CBP, chronic L shoulder pain, galactorrhea, neuropathy, HPV, hypercholestermeia, HTN, MS, osteopenia, PONV, wrist fracture  are also affecting patient's functional outcome.   REHAB POTENTIAL: Fair    CLINICAL DECISION MAKING: Evolving/moderate complexity  EVALUATION COMPLEXITY: Moderate  PLAN: PT FREQUENCY: 2x/week  PT DURATION: 12 weeks  PLANNED INTERVENTIONS: Therapeutic exercises, Therapeutic activity, Neuromuscular re-education, Balance training, Gait training, Patient/Family education, Self Care, Joint mobilization, Stair training, Vestibular training, Canalith repositioning, Visual/preceptual remediation/compensation, Orthotic/Fit training, DME  instructions, Dry Needling, Electrical stimulation, Wheelchair mobility training, Spinal mobilization, Cryotherapy, Moist heat, Taping, Vasopneumatic device, Traction, Ultrasound, Ionotophoresis 57m/ml Dexamethasone, Manual therapy, and Re-evaluation  PLAN FOR NEXT SESSION: standing balance and strengthening, work on ambulation of decline and standing balance with decline.      MJanna Arch PT 04/25/2022, 11:15 AM

## 2022-04-25 ENCOUNTER — Ambulatory Visit: Payer: Self-pay | Admitting: General Surgery

## 2022-04-25 ENCOUNTER — Ambulatory Visit: Payer: PPO

## 2022-04-25 DIAGNOSIS — R262 Difficulty in walking, not elsewhere classified: Secondary | ICD-10-CM

## 2022-04-25 DIAGNOSIS — M6281 Muscle weakness (generalized): Secondary | ICD-10-CM | POA: Diagnosis not present

## 2022-04-25 DIAGNOSIS — R2681 Unsteadiness on feet: Secondary | ICD-10-CM

## 2022-04-25 DIAGNOSIS — L720 Epidermal cyst: Secondary | ICD-10-CM | POA: Diagnosis not present

## 2022-04-25 NOTE — H&P (View-Only) (Signed)
PATIENT PROFILE: Lisa Williams is a 61 y.o. female who presents to the Clinic for consultation at the request of Dr. Ouida Sills for evaluation of epidermal inclusion cyst.  PCP:  Harrold Donath, MD  HISTORY OF PRESENT ILLNESS: Lisa Williams reports she has been feeling a knot in the left groin for about a year.  She endorses that he had good effect multiple times.  She had to go to the ED 1 time for incision and drainage.  Last time a month ago it drained spontaneously.  She was also treated with oral antibiotic therapy.  She still feels a knot on the area of the previous infection.  She endorses that it aggravated her MS during the pain and infection episodes.  Pain localized to the left groin.  No pain radiation.  Pain aggravated by applying pressure.  Elevating functional use resolution of infection.   PROBLEM LIST: Problem List  Date Reviewed: 01/12/2022          Noted   Uses wheelchair 07/12/2021   Multiple sclerosis, relapsing-remitting (CMS-HCC) 07/09/2019   Multiple sclerosis of cord (CMS-HCC) 06/13/2018   MS (multiple sclerosis) (CMS-HCC) 10/08/2017   Localized osteoporosis with current pathological fracture with routine healing 01/19/2017   Overview    Started 10-19 and planned for 5 years      Obesity (BMI 30-39.9), unspecified 11/21/2016   Health care maintenance 01/24/2016   Overview    Mammogram 2-16 , declines flu vaccines on neuro's advice on ms she says though I told her I thought she could take it. Colonoscopy 12-13 with normal results, Hx of vaginal hysterectomy  MEDICARE WELLNESS VISIT   PROVIDERS RENDERING CARE [facility], Heme onc for infusions, Dr. Ouida Sills   FUNCTIONAL ASSESSMENT  (1) Hearing: Demonstrates normal hearing in conversation.  (2) Risk of Falls: No reports of falls or abnormal balance. Gait is observed to be good upon observation.  (3) Home Safety; Home is safe and secure (4) Activities of Daily Living; Household chores and grooming  are managed with minimal help. Personal finances are managed without problems.   DEPRESSION SCREENING There does not seem to be loss of interest in activities nor excess crying or changes in sleep or appetite.   COGNITIVE SCREENING Orientation is appropriate as are responses to questions and general conversation. No reports of forgetfulness or losing things.    PREVENTION PLAN Cardiovascular: Has had normal cholesterol Diabetes: Yearly glucose  Mammogram: Yearly mammogram Cervical; Vaginal hysterectomy noted Bone Density; DXA ordered 7-19 Colon Cancer: 12-13 normal and normal risk Glaucoma: Yearly eye exam noted Pneumonia: Declines Shingles: Declines shingrix and distant Zoster illness history noted Covid; Has had Influenza: Declines  Smoking Cessation: NA   OTHER PERSONALIZED HEALTH ADVISE Continue pt for walking help as needed  [place] Full Code       [name]             Essential hypertension 10/25/2015   Overview    telmisartan       Chronic left shoulder pain 07/19/2015   Sleep disorder 05/21/2014   Spasticity 05/21/2014   Spastic neurogenic bladder 05/21/2014   Unspecified hereditary and idiopathic peripheral neuropathy 08/19/2013    GENERAL REVIEW OF SYSTEMS:   General ROS: negative for - chills, fatigue, fever, weight gain or weight loss Allergy and Immunology ROS: negative for - hives  Hematological and Lymphatic ROS: negative for - bleeding problems or bruising, negative for palpable nodes Endocrine ROS: negative for - heat  or cold intolerance, hair changes Respiratory ROS: negative for - cough, shortness of breath or wheezing Cardiovascular ROS: no chest pain or palpitations GI ROS: negative for nausea, vomiting, abdominal pain, diarrhea, constipation Musculoskeletal ROS: negative for - joint swelling or muscle pain Neurological ROS: negative for - confusion, syncope Dermatological ROS: negative for pruritus and  rash Psychiatric: negative for anxiety, depression, difficulty sleeping and memory loss  MEDICATIONS: Current Outpatient Medications  Medication Sig Dispense Refill   baclofen (LIORESAL) 20 MG tablet TAKE 1 TABLET BY MOUTH THREE TIMES DAILY 270 tablet 1   cholecalciferol (VITAMIN D3) 2,000 unit tablet Take 2,000 Units by mouth once daily     gabapentin (NEURONTIN) 100 MG capsule Take two capsules 200 mg twice a day 120 capsule 3   MAYZENT 2 mg Tab      oxybutynin (DITROPAN-XL) 10 MG XL tablet Take 10 mg by mouth once daily     telmisartan (MICARDIS) 40 MG tablet TAKE 1 TABLET(40 MG) BY MOUTH EVERY DAY 90 tablet 3   tiZANidine (ZANAFLEX) 4 MG tablet Take 4 mg by mouth 2 (two) times daily     ibandronate (BONIVA) 150 mg tablet TAKE 1 TABLET(150 MG) BY MOUTH EVERY 30 DAYS WITH A FULL GLASS OF WATER. DO NOT LIE DOWN FOR THE NEXT 60 MINUTES (Patient not taking: Reported on 01/12/2022) 3 tablet 3   tiZANidine (ZANAFLEX) 2 MG tablet Take 2 mg by mouth 3 (three) times daily (Patient not taking: Reported on 01/12/2022)     No current facility-administered medications for this visit.    ALLERGIES: Amlodipine and Betadine [povidone-iodine]  PAST MEDICAL HISTORY: Past Medical History:  Diagnosis Date   Abnormal cytology 06/2015   Chronic back pain    Hypertension 07/17/2015   Localized osteoporosis with current pathological fracture with routine healing    Multiple sclerosis (CMS-HCC)    Pure hypercholesterolemia     PAST SURGICAL HISTORY: Past Surgical History:  Procedure Laterality Date   TUBAL LIGATION  07/1994   COLONOSCOPY  04/18/2012   FH Colon Polyps (Mother): CBF 04/2017; Recall Ltr mailed 03/20/2017 (dw)   HYSTERECTOMY  02/2013   FRACTURE SURGERY  01/16/2017   fracture of left wrist   BTL     HYSTERECTOMY VAGINAL     KNEE SURGERY     Left wrist surgery       FAMILY HISTORY: Family History  Problem Relation Age of Onset   Osteoarthritis Mother    Colon polyps Mother     High blood pressure (Hypertension) Mother    Cancer Father    Glaucoma Father    High blood pressure (Hypertension) Father    Prostate cancer Father    Hip fracture Father    High blood pressure (Hypertension) Sister    Deep vein thrombosis (DVT or abnormal blood clot formation) Sister    High blood pressure (Hypertension) Sister    Obesity Sister    High blood pressure (Hypertension) Sister    Obesity Sister    Thyroid disease Sister    High blood pressure (Hypertension) Maternal Grandmother      SOCIAL HISTORY: Social History   Socioeconomic History   Marital status: Single  Tobacco Use   Smoking status: Former    Packs/day: 0.00    Years: 0.00    Additional pack years: 0.00    Total pack years: 0.00    Types: Cigarettes    Quit date: 07/06/2016    Years since quitting: 5.8   Smokeless tobacco: Never  Vaping  Use   Vaping Use: Never used  Substance and Sexual Activity   Alcohol use: No   Drug use: No   Sexual activity: Not Currently    Partners: Male    Birth control/protection: None    PHYSICAL EXAM: Vitals:   04/25/22 1113  BP: (!) 161/68  Pulse: 65   Body mass index is 37.38 kg/m. Weight: (!) 121.1 kg (267 lb)   GENERAL: Alert, active, oriented x3  HEENT: Pupils equal reactive to light. Extraocular movements are intact. Sclera clear. Palpebral conjunctiva normal red color.Pharynx clear.  NECK: Supple with no palpable mass and no adenopathy.  LUNGS: Sound clear with no rales rhonchi or wheezes.  HEART: Regular rhythm S1 and S2 without murmur.  ABDOMEN: Soft and depressible, nontender with no palpable mass, no hepatomegaly. Wounds dry and clean.  EXTREMITIES: Well-developed well-nourished symmetrical with no dependent edema.  Left groin small, round not.  Mobile.  No fluid collection at this moment.  NEUROLOGICAL: Awake alert oriented, facial expression symmetrical, moving all extremities.  REVIEW OF DATA: I have reviewed the following data  today: No visits with results within 3 Month(s) from this visit.  Latest known visit with results is:  Office Visit on 01/12/2022  Component Date Value   Influenza A PCR 01/12/2022 Negative    Influenza B PCR 01/12/2022 Negative    SARS-CoV2 PCR 01/12/2022 Negative      ASSESSMENT: Ms. Dercole is a 61 y.o. female presenting for consultation for epidermal inclusion cyst. The patient has a mass on the left groin that is causing some pain and discomfort on pressure. Patient oriented about the diagnosis of epidermal inclusion cyst, not a malignant lesion but if it is causing symptoms, excision can be considered. Patient oriented about the procedure, benefits and risk.   Patient Dors that due to her MAC will be difficult to do this procedure in the office.  She would prefer to have sedation in the operating room.  Epidermal inclusion cyst [L72.0]  PLAN: Excision of left groin cyst (16073) Avoid taking aspirin or blood thinner before the surgery Contact us if you have any question or concern  Patient verbalized understanding, all questions were answered, and were agreeable with the plan outlined above.   Herbert Pun, MD  Electronically signed by Herbert Pun, MD

## 2022-04-25 NOTE — H&P (Signed)
PATIENT PROFILE: Lisa Williams is a 61 y.o. female who presents to the Clinic for consultation at the request of Dr. Ouida Sills for evaluation of epidermal inclusion cyst.  PCP:  Harrold Donath, MD  HISTORY OF PRESENT ILLNESS: Lisa Williams reports she has been feeling a knot in the left groin for about a year.  She endorses that he had good effect multiple times.  She had to go to the ED 1 time for incision and drainage.  Last time a month ago it drained spontaneously.  She was also treated with oral antibiotic therapy.  She still feels a knot on the area of the previous infection.  She endorses that it aggravated her MS during the pain and infection episodes.  Pain localized to the left groin.  No pain radiation.  Pain aggravated by applying pressure.  Elevating functional use resolution of infection.   PROBLEM LIST: Problem List  Date Reviewed: 01/12/2022          Noted   Uses wheelchair 07/12/2021   Multiple sclerosis, relapsing-remitting (CMS-HCC) 07/09/2019   Multiple sclerosis of cord (CMS-HCC) 06/13/2018   MS (multiple sclerosis) (CMS-HCC) 10/08/2017   Localized osteoporosis with current pathological fracture with routine healing 01/19/2017   Overview    Started 10-19 and planned for 5 years      Obesity (BMI 30-39.9), unspecified 11/21/2016   Health care maintenance 01/24/2016   Overview    Mammogram 2-16 , declines flu vaccines on neuro's advice on ms she says though I told her I thought she could take it. Colonoscopy 12-13 with normal results, Hx of vaginal hysterectomy  MEDICARE WELLNESS VISIT   PROVIDERS RENDERING CARE [facility], Heme onc for infusions, Dr. Ouida Sills   FUNCTIONAL ASSESSMENT  (1) Hearing: Demonstrates normal hearing in conversation.  (2) Risk of Falls: No reports of falls or abnormal balance. Gait is observed to be good upon observation.  (3) Home Safety; Home is safe and secure (4) Activities of Daily Living; Household chores and grooming  are managed with minimal help. Personal finances are managed without problems.   DEPRESSION SCREENING There does not seem to be loss of interest in activities nor excess crying or changes in sleep or appetite.   COGNITIVE SCREENING Orientation is appropriate as are responses to questions and general conversation. No reports of forgetfulness or losing things.    PREVENTION PLAN Cardiovascular: Has had normal cholesterol Diabetes: Yearly glucose  Mammogram: Yearly mammogram Cervical; Vaginal hysterectomy noted Bone Density; DXA ordered 7-19 Colon Cancer: 12-13 normal and normal risk Glaucoma: Yearly eye exam noted Pneumonia: Declines Shingles: Declines shingrix and distant Zoster illness history noted Covid; Has had Influenza: Declines  Smoking Cessation: NA   OTHER PERSONALIZED HEALTH ADVISE Continue pt for walking help as needed  [place] Full Code       [name]             Essential hypertension 10/25/2015   Overview    telmisartan       Chronic left shoulder pain 07/19/2015   Sleep disorder 05/21/2014   Spasticity 05/21/2014   Spastic neurogenic bladder 05/21/2014   Unspecified hereditary and idiopathic peripheral neuropathy 08/19/2013    GENERAL REVIEW OF SYSTEMS:   General ROS: negative for - chills, fatigue, fever, weight gain or weight loss Allergy and Immunology ROS: negative for - hives  Hematological and Lymphatic ROS: negative for - bleeding problems or bruising, negative for palpable nodes Endocrine ROS: negative for - heat  or cold intolerance, hair changes Respiratory ROS: negative for - cough, shortness of breath or wheezing Cardiovascular ROS: no chest pain or palpitations GI ROS: negative for nausea, vomiting, abdominal pain, diarrhea, constipation Musculoskeletal ROS: negative for - joint swelling or muscle pain Neurological ROS: negative for - confusion, syncope Dermatological ROS: negative for pruritus and  rash Psychiatric: negative for anxiety, depression, difficulty sleeping and memory loss  MEDICATIONS: Current Outpatient Medications  Medication Sig Dispense Refill   baclofen (LIORESAL) 20 MG tablet TAKE 1 TABLET BY MOUTH THREE TIMES DAILY 270 tablet 1   cholecalciferol (VITAMIN D3) 2,000 unit tablet Take 2,000 Units by mouth once daily     gabapentin (NEURONTIN) 100 MG capsule Take two capsules 200 mg twice a day 120 capsule 3   MAYZENT 2 mg Tab      oxybutynin (DITROPAN-XL) 10 MG XL tablet Take 10 mg by mouth once daily     telmisartan (MICARDIS) 40 MG tablet TAKE 1 TABLET(40 MG) BY MOUTH EVERY DAY 90 tablet 3   tiZANidine (ZANAFLEX) 4 MG tablet Take 4 mg by mouth 2 (two) times daily     ibandronate (BONIVA) 150 mg tablet TAKE 1 TABLET(150 MG) BY MOUTH EVERY 30 DAYS WITH A FULL GLASS OF WATER. DO NOT LIE DOWN FOR THE NEXT 60 MINUTES (Patient not taking: Reported on 01/12/2022) 3 tablet 3   tiZANidine (ZANAFLEX) 2 MG tablet Take 2 mg by mouth 3 (three) times daily (Patient not taking: Reported on 01/12/2022)     No current facility-administered medications for this visit.    ALLERGIES: Amlodipine and Betadine [povidone-iodine]  PAST MEDICAL HISTORY: Past Medical History:  Diagnosis Date   Abnormal cytology 06/2015   Chronic back pain    Hypertension 07/17/2015   Localized osteoporosis with current pathological fracture with routine healing    Multiple sclerosis (CMS-HCC)    Pure hypercholesterolemia     PAST SURGICAL HISTORY: Past Surgical History:  Procedure Laterality Date   TUBAL LIGATION  07/1994   COLONOSCOPY  04/18/2012   FH Colon Polyps (Mother): CBF 04/2017; Recall Ltr mailed 03/20/2017 (dw)   HYSTERECTOMY  02/2013   FRACTURE SURGERY  01/16/2017   fracture of left wrist   BTL     HYSTERECTOMY VAGINAL     KNEE SURGERY     Left wrist surgery       FAMILY HISTORY: Family History  Problem Relation Age of Onset   Osteoarthritis Mother    Colon polyps Mother     High blood pressure (Hypertension) Mother    Cancer Father    Glaucoma Father    High blood pressure (Hypertension) Father    Prostate cancer Father    Hip fracture Father    High blood pressure (Hypertension) Sister    Deep vein thrombosis (DVT or abnormal blood clot formation) Sister    High blood pressure (Hypertension) Sister    Obesity Sister    High blood pressure (Hypertension) Sister    Obesity Sister    Thyroid disease Sister    High blood pressure (Hypertension) Maternal Grandmother      SOCIAL HISTORY: Social History   Socioeconomic History   Marital status: Single  Tobacco Use   Smoking status: Former    Packs/day: 0.00    Years: 0.00    Additional pack years: 0.00    Total pack years: 0.00    Types: Cigarettes    Quit date: 07/06/2016    Years since quitting: 5.8   Smokeless tobacco: Never  Vaping  Use   Vaping Use: Never used  Substance and Sexual Activity   Alcohol use: No   Drug use: No   Sexual activity: Not Currently    Partners: Male    Birth control/protection: None    PHYSICAL EXAM: Vitals:   04/25/22 1113  BP: (!) 161/68  Pulse: 65   Body mass index is 37.38 kg/m. Weight: (!) 121.1 kg (267 lb)   GENERAL: Alert, active, oriented x3  HEENT: Pupils equal reactive to light. Extraocular movements are intact. Sclera clear. Palpebral conjunctiva normal red color.Pharynx clear.  NECK: Supple with no palpable mass and no adenopathy.  LUNGS: Sound clear with no rales rhonchi or wheezes.  HEART: Regular rhythm S1 and S2 without murmur.  ABDOMEN: Soft and depressible, nontender with no palpable mass, no hepatomegaly. Wounds dry and clean.  EXTREMITIES: Well-developed well-nourished symmetrical with no dependent edema.  Left groin small, round not.  Mobile.  No fluid collection at this moment.  NEUROLOGICAL: Awake alert oriented, facial expression symmetrical, moving all extremities.  REVIEW OF DATA: I have reviewed the following data  today: No visits with results within 3 Month(s) from this visit.  Latest known visit with results is:  Office Visit on 01/12/2022  Component Date Value   Influenza A PCR 01/12/2022 Negative    Influenza B PCR 01/12/2022 Negative    SARS-CoV2 PCR 01/12/2022 Negative      ASSESSMENT: Ms. Dercole is a 61 y.o. female presenting for consultation for epidermal inclusion cyst. The patient has a mass on the left groin that is causing some pain and discomfort on pressure. Patient oriented about the diagnosis of epidermal inclusion cyst, not a malignant lesion but if it is causing symptoms, excision can be considered. Patient oriented about the procedure, benefits and risk.   Patient Dors that due to her MAC will be difficult to do this procedure in the office.  She would prefer to have sedation in the operating room.  Epidermal inclusion cyst [L72.0]  PLAN: Excision of left groin cyst (16073) Avoid taking aspirin or blood thinner before the surgery Contact us if you have any question or concern  Patient verbalized understanding, all questions were answered, and were agreeable with the plan outlined above.   Herbert Pun, MD  Electronically signed by Herbert Pun, MD

## 2022-04-26 NOTE — Therapy (Signed)
OUTPATIENT PHYSICAL THERAPY NEURO TREATMENT/Physical Therapy Progress Note   Dates of reporting period  03/16/22   to   04/25/22   Patient Name: Lisa Williams MRN: [id number] DOB:[birth year], 61 y.o., female Today's Date: 04/26/2022  PCP: Kirk Ruths  REFERRING PROVIDER: Britt Bottom MD     Past Medical History:  Diagnosis Date   Abdominal pain, right upper quadrant    Back pain    Calculus of kidney 12/09/2013   Chronic back pain    unspecified   Chronic left shoulder pain 0/25/8527   Complication of anesthesia    Functional disorder of bladder    other   Galactorrhea 11/26/2014   Chronic    Hereditary and idiopathic neuropathy 08/19/2013   HPV test positive    Hypercholesteremia 08/19/2013   Hypertension    Incomplete bladder emptying    Microscopic hematuria    MS (multiple sclerosis) (HCC)    Muscle spasticity 05/21/2014   Nonspecific findings on examination of urine    other   Osteopenia    PONV (postoperative nausea and vomiting)    Status post laparoscopic supracervical hysterectomy 11/26/2014   Tobacco user 11/26/2014   Wrist fracture    Past Surgical History:  Procedure Laterality Date   bilateral tubal ligation  1996   BREAST CYST EXCISION Left 2002   FRACTURE SURGERY     KNEE SURGERY     right   LAPAROSCOPIC SUPRACERVICAL HYSTERECTOMY  08/05/2013   ORIF WRIST FRACTURE Left 01/17/2017   Procedure: OPEN REDUCTION INTERNAL FIXATION (ORIF) WRIST FRACTURE;  Surgeon: Lovell Sheehan, MD;  Location: [facility];  Service: Orthopedics;  Laterality: Left;   RADIOLOGY WITH ANESTHESIA N/A 03/18/2020   Procedure: MRI WITH ANESTHESIA CERVICAL SPINE AND BRAIN  WITH AND WITHOUT CONTRAST;  Surgeon: Radiologist, Medication, MD;  Location: [facility];  Service: Radiology;  Laterality: N/A;   TUBAL LIGATION Bilateral    VAGINAL HYSTERECTOMY  03/2006   Patient Active Problem List   Diagnosis Date Noted   Abscess of left groin 11/15/2021   Abnormal LFTs  11/15/2021   Acute respiratory disease due to COVID-19 virus 11/21/2020   Weakness    Hypoalbuminemia due to protein-calorie malnutrition (HCC)    Neurogenic bowel    Neurogenic bladder    Labile blood pressure    Neuropathic pain    Abscess of female pelvis    SVT (supraventricular tachycardia)    Radial styloid tenosynovitis 03/12/2018   Wheelchair confinement 02/27/2018   Localized osteoporosis with current pathological fracture with routine healing 01/19/2017   Wrist fracture 01/16/2017   Sprain of ankle 03/23/2016   Closed fracture of lateral malleolus 03/16/2016   Health care maintenance 01/24/2016   Blood pressure elevated without history of HTN 10/25/2015   Essential hypertension 10/25/2015   Multiple sclerosis ([place]) 10/02/2015   Chronic left shoulder pain 07/19/2015   Multiple sclerosis exacerbation ([place]) 07/14/2015   MS (multiple sclerosis) ([place]) 11/26/2014   Increased body mass index 11/26/2014   HPV test positive 11/26/2014   Status post laparoscopic supracervical hysterectomy 11/26/2014   Galactorrhea 11/26/2014   Back ache 05/21/2014   Adiposity 05/21/2014   Disordered sleep 05/21/2014   Muscle spasticity 05/21/2014   Spasticity 05/21/2014   Calculus of kidney 78/24/2353   Renal colic 61/44/3154   Hypercholesteremia 08/19/2013   Hereditary and idiopathic neuropathy 08/19/2013   Hypercholesterolemia without hypertriglyceridemia 08/19/2013   Bladder infection, chronic 07/25/2012   Disorder of bladder function 07/25/2012   Incomplete bladder emptying 07/25/2012  Microscopic hematuria 07/25/2012   Right upper quadrant pain 07/25/2012   ONSET DATE: 1995  REFERRING DIAG: MS  THERAPY DIAG:  No diagnosis found.  Rationale for Evaluation and Treatment Rehabilitation  SUBJECTIVE:                                                                                                                                                                                           SUBJECTIVE STATEMENT: *** .   Pt accompanied by: self  PERTINENT HISTORY: Patient is retuning to PT after hospitalization on 11/14/21 where she then went to SNF and then received home health therapy which stopped 02/08/22. Patient has weakness in BLE with RLE>LLE. She drives with hand controls. Patient has been diagnosed with MS in 1995.  PMH includes: back pain, CBP, chronic L shoulder pain, galactorrhea, neuropathy, HPV, hypercholestermeia, HTN, MS, osteopenia, PONV, wrist fracture. Started back driving about two weeks ago.   PAIN:  Are you having pain? Yes: NPRS scale: 2/10 Pain location: abdomen to R leg Pain description: spasm Aggravating factors: unknown Relieving factors: n/a  PRECAUTIONS: Fall  WEIGHT BEARING RESTRICTIONS No  FALLS: Has patient fallen in last 6 months? No  LIVING ENVIRONMENT: Lives with: lives with their family Lives in: House/apartment Stairs: Yes: Internal: full flight steps; but lives fully on first floor Has following equipment at home: Gilford Rile - 2 wheeled, Wheelchair (manual), shower chair, Grab bars, and Ramped entry  PLOF: Independent with household mobility with device  PATIENT GOALS be able to get into the bed without assistance, improve ambulation, standing tolerance, going up steps.   OBJECTIVE:   DIAGNOSTIC FINDINGS: MRI of the brain 03/18/2020 showed multiple T2/FLAIR hyperintense foci in the periventricular, juxtacortical and deep white matter.  There were no infratentorial lesions noted.  None of the foci enhanced.   Due to severe claustrophobia, the study was done with conscious sedation in the hospital  MRI of the cervical spine 03/18/2020 showed T2 hyperintense foci at C1-C2, C2-C3, C4-C5, C5-C6 and T1.  There is a disc protrusion at C5-C6 to the left and a larger disc protrusion at C6-C7 to the right that distorts the thecal sac.  The adjacent spinal cord has normal signal.  There could be compression of the right C7 nerve root.   LOWER  EXTREMITY MMT:    MMT Right Eval Left Eval  Hip flexion 2- 2  Hip extension 2- 2  Hip abduction 2+ 2+  Hip adduction 2+ 2+  Knee flexion 2- 2  Knee extension 2 2  Ankle dorsiflexion trace trace  Ankle plantarflexion trace trace  (Blank rows = not tested)   TODAY'S TREATMENT:  There Ex: Ambulation in hallway: 91 ft (CGA, Bariatric Walker, wheelchair follow)   *Patient required 3 seated rest breaks*   Matrix Machine Seated Strengthening:  - Matrix Machine Rows: 12.5 lbs 2x10 - Matrix Machine Pull Down: 12.5 lbs 2x10 - Pallof Press: 7.5 lbs 1x10 each side    Neuro Rehab:  Seated on dyna disc boxing: - Right punch, Left punch cross body combo 10x - Right, right, left punch 10x  - Left, left, right punch 10x - Right punch, left punch, overhead dodge 10x  - Left punch, right punch, overhead dodge 10x  - Right punch, left punch, hands up to dodge 10x  - Left punch, left punch, hands up to dodge 10x  - Alternating punches, double hand uppercut, double hand slam down 10x     PATIENT EDUCATION: Education details: goals, POC, outcome measures Person educated: Patient Education method: Explanation, Demonstration, Tactile cues, and Verbal cues Education comprehension: verbalized understanding, returned demonstration, verbal cues required, and tactile cues required   HOME EXERCISE PROGRAM: Give next session  GOALS: Goals reviewed with patient? Yes  SHORT TERM GOALS: Target date: 03/28/2022  Patient will be independent in home exercise program to improve strength/mobility for better functional independence with ADLs. Baseline:11/9: compliant 12/19: HEP compliant  Goal status: Partially Met  LONG TERM GOALS: Target date: 05/09/2022  Patient will increase FOTO score to equal to or greater than  52%   to demonstrate statistically significant improvement in mobility and quality of life Baseline: 10/10: 42% 11/9: 40% 12/19: 40%  Goal status: in progress  2.  Patient (> 38  years old) will complete five times sit to stand test in < 15 seconds indicating an increased LE strength and improved balance. Baseline: 10/10: 38 seconds with one plop heavy UE support 11/9: 26 seconds with heavy BUE support 12/19: 37 seconds with BUE support Goal status: In progress  3.  Patient will increase 10 meter walk test to >0.5 m/s as to improve gait speed for better community ambulation and to reduce fall risk. Baseline: 10/10: 54 seconds with bRW andw/c follow 11/9: 43.3 seconds with RW and w/c follow 12/19: 40.9 seconds with RW and w/c follow  Goal status: In progress  4.  Patient will be able to get into bed independently with use of railing to increase independence with mobility and nighttime routine.  Baseline: 10/10: needs complete assistance to bring RLE into bed. 11/9: needs assistance with leg; 12/19: min a for RLE  Goal status: In progress  5.  Patient will go up 2 small steps to enter sisters house with min A.  Baseline: 10/10: unable to perform 11/9: unable to perform on 2 " step  12/18: unable to perform  Goal status: IN PROGRESS   ASSESSMENT:  CLINICAL IMPRESSION: ***   Patient will continue to benefit from skilled physical therapy intervention to address impairments, improve QOL, and attain therapy goals.    OBJECTIVE IMPAIRMENTS Abnormal gait, decreased activity tolerance, decreased balance, decreased coordination, decreased endurance, decreased mobility, difficulty walking, decreased ROM, decreased strength, hypomobility, increased edema, impaired perceived functional ability, increased muscle spasms, impaired flexibility, impaired tone, impaired UE functional use, improper body mechanics, postural dysfunction, obesity, and pain.   ACTIVITY LIMITATIONS carrying, lifting, bending, standing, squatting, sleeping, stairs, transfers, bed mobility, bathing, toileting, dressing, reach over head, locomotion level, and caring for others  PARTICIPATION LIMITATIONS: meal  prep, cleaning, laundry, shopping, community activity, and yard work  PERSONAL FACTORS Age, Fitness, Past/current experiences, Sex, Social background, Time since  onset of injury/illness/exacerbation, Transportation, and 3+ comorbidities: back pain, CBP, chronic L shoulder pain, galactorrhea, neuropathy, HPV, hypercholestermeia, HTN, MS, osteopenia, PONV, wrist fracture  are also affecting patient's functional outcome.   REHAB POTENTIAL: Fair    CLINICAL DECISION MAKING: Evolving/moderate complexity  EVALUATION COMPLEXITY: Moderate  PLAN: PT FREQUENCY: 2x/week  PT DURATION: 12 weeks  PLANNED INTERVENTIONS: Therapeutic exercises, Therapeutic activity, Neuromuscular re-education, Balance training, Gait training, Patient/Family education, Self Care, Joint mobilization, Stair training, Vestibular training, Canalith repositioning, Visual/preceptual remediation/compensation, Orthotic/Fit training, DME instructions, Dry Needling, Electrical stimulation, Wheelchair mobility training, Spinal mobilization, Cryotherapy, Moist heat, Taping, Vasopneumatic device, Traction, Ultrasound, Ionotophoresis 93m/ml Dexamethasone, Manual therapy, and Re-evaluation  PLAN FOR NEXT SESSION: standing balance and strengthening, work on ambulation of decline and standing balance with decline.      MJanna Arch PT 04/26/2022, 12:09 PM

## 2022-04-27 ENCOUNTER — Ambulatory Visit: Payer: PPO

## 2022-04-27 DIAGNOSIS — M6281 Muscle weakness (generalized): Secondary | ICD-10-CM

## 2022-04-27 DIAGNOSIS — R262 Difficulty in walking, not elsewhere classified: Secondary | ICD-10-CM

## 2022-04-27 DIAGNOSIS — R2681 Unsteadiness on feet: Secondary | ICD-10-CM

## 2022-04-29 NOTE — Therapy (Signed)
OUTPATIENT PHYSICAL THERAPY NEURO TREATMENT  Patient Name: Lisa Williams MRN: [id number] DOB:06/01/1960, 61 y.o., female Today's Date: 05/02/2022  PCP: Lauro Regulus  REFERRING PROVIDER: Asa Lente MD   PT End of Session - 05/02/22 1007     Visit Number 22    Number of Visits 24    Date for PT Re-Evaluation 05/09/22    Authorization Type 2/10 PN 12/19    PT Start Time 1015    PT Stop Time 1059    PT Time Calculation (min) 44 min    Equipment Utilized During Treatment Gait belt    Activity Tolerance Patient tolerated treatment well;Patient limited by fatigue    Behavior During Therapy [facility] for tasks assessed/performed               Past Medical History:  Diagnosis Date   Abdominal pain, right upper quadrant    Back pain    Calculus of kidney 12/09/2013   Chronic back pain    unspecified   Chronic left shoulder pain 07/19/2015   Complication of anesthesia    Functional disorder of bladder    other   Galactorrhea 11/26/2014   Chronic    Hereditary and idiopathic neuropathy 08/19/2013   History of kidney stones    HPV test positive    Hypercholesteremia 08/19/2013   Hypertension    Incomplete bladder emptying    Microscopic hematuria    MS (multiple sclerosis) (HCC)    Muscle spasticity 05/21/2014   Nonspecific findings on examination of urine    other   Osteopenia    PONV (postoperative nausea and vomiting)    Status post laparoscopic supracervical hysterectomy 11/26/2014   Tobacco user 11/26/2014   Wrist fracture    Past Surgical History:  Procedure Laterality Date   bilateral tubal ligation  1996   BREAST CYST EXCISION Left 2002   FRACTURE SURGERY     KNEE SURGERY     right   LAPAROSCOPIC SUPRACERVICAL HYSTERECTOMY  08/05/2013   ORIF WRIST FRACTURE Left 01/17/2017   Procedure: OPEN REDUCTION INTERNAL FIXATION (ORIF) WRIST FRACTURE;  Surgeon: Lyndle Herrlich, MD;  Location: [facility] ORS;  Service: Orthopedics;  Laterality: Left;    RADIOLOGY WITH ANESTHESIA N/A 03/18/2020   Procedure: MRI WITH ANESTHESIA CERVICAL SPINE AND BRAIN  WITH AND WITHOUT CONTRAST;  Surgeon: Radiologist, Medication, MD;  Location: [facility] OR;  Service: Radiology;  Laterality: N/A;   TUBAL LIGATION Bilateral    VAGINAL HYSTERECTOMY  03/2006   Patient Active Problem List   Diagnosis Date Noted   Abscess of left groin 11/15/2021   Abnormal LFTs 11/15/2021   Acute respiratory disease due to COVID-19 virus 11/21/2020   Weakness    Hypoalbuminemia due to protein-calorie malnutrition (HCC)    Neurogenic bowel    Neurogenic bladder    Labile blood pressure    Neuropathic pain    Abscess of female pelvis    SVT (supraventricular tachycardia)    Radial styloid tenosynovitis 03/12/2018   Wheelchair confinement 02/27/2018   Localized osteoporosis with current pathological fracture with routine healing 01/19/2017   Wrist fracture 01/16/2017   Sprain of ankle 03/23/2016   Closed fracture of lateral malleolus 03/16/2016   Health care maintenance 01/24/2016   Blood pressure elevated without history of HTN 10/25/2015   Essential hypertension 10/25/2015   Multiple sclerosis (HCC) 10/02/2015   Chronic left shoulder pain 07/19/2015   Multiple sclerosis exacerbation (HCC) 07/14/2015   MS (multiple sclerosis) (HCC) 11/26/2014   Increased body mass  index 11/26/2014   HPV test positive 11/26/2014   Status post laparoscopic supracervical hysterectomy 11/26/2014   Galactorrhea 11/26/2014   Back ache 05/21/2014   Adiposity 05/21/2014   Disordered sleep 05/21/2014   Muscle spasticity 05/21/2014   Spasticity 05/21/2014   Calculus of kidney 12/09/2013   Renal colic 12/09/2013   Hypercholesteremia 08/19/2013   Hereditary and idiopathic neuropathy 08/19/2013   Hypercholesterolemia without hypertriglyceridemia 08/19/2013   Bladder infection, chronic 07/25/2012   Disorder of bladder function 07/25/2012   Incomplete bladder emptying 07/25/2012   Microscopic  hematuria 07/25/2012   Right upper quadrant pain 07/25/2012   ONSET DATE: 1995  REFERRING DIAG: MS  THERAPY DIAG:  Muscle weakness (generalized)  Difficulty in walking, not elsewhere classified  Unsteadiness on feet  Rationale for Evaluation and Treatment Rehabilitation  SUBJECTIVE:                                                                                                                                                                                          SUBJECTIVE STATEMENT: Patient went to pre-admit prior to PT session. Was fatigued yesterday from the holiday and cleaning.  .   Pt accompanied by: self  PERTINENT HISTORY: Patient is retuning to PT after hospitalization on 11/14/21 where she then went to SNF and then received home health therapy which stopped 02/08/22. Patient has weakness in BLE with RLE>LLE. She drives with hand controls. Patient has been diagnosed with MS in 1995.  PMH includes: back pain, CBP, chronic L shoulder pain, galactorrhea, neuropathy, HPV, hypercholestermeia, HTN, MS, osteopenia, PONV, wrist fracture. Started back driving about two weeks ago.   PAIN:  Are you having pain? Yes: NPRS scale: 2/10 Pain location: abdomen to R leg Pain description: spasm Aggravating factors: unknown Relieving factors: n/a  PRECAUTIONS: Fall  WEIGHT BEARING RESTRICTIONS No  FALLS: Has patient fallen in last 6 months? No  LIVING ENVIRONMENT: Lives with: lives with their family Lives in: House/apartment Stairs: Yes: Internal: full flight steps; but lives fully on first floor Has following equipment at home: Dan Humphreys - 2 wheeled, Wheelchair (manual), shower chair, Grab bars, and Ramped entry  PLOF: Independent with household mobility with device  PATIENT GOALS be able to get into the bed without assistance, improve ambulation, standing tolerance, going up steps.   OBJECTIVE:   DIAGNOSTIC FINDINGS: MRI of the brain 03/18/2020 showed multiple T2/FLAIR  hyperintense foci in the periventricular, juxtacortical and deep white matter.  There were no infratentorial lesions noted.  None of the foci enhanced.   Due to severe claustrophobia, the study was done with conscious sedation in the hospital  MRI of the cervical spine 03/18/2020  showed T2 hyperintense foci at C1-C2, C2-C3, C4-C5, C5-C6 and T1.  There is a disc protrusion at C5-C6 to the left and a larger disc protrusion at C6-C7 to the right that distorts the thecal sac.  The adjacent spinal cord has normal signal.  There could be compression of the right C7 nerve root.   LOWER EXTREMITY MMT:    MMT Right Eval Left Eval  Hip flexion 2- 2  Hip extension 2- 2  Hip abduction 2+ 2+  Hip adduction 2+ 2+  Knee flexion 2- 2  Knee extension 2 2  Ankle dorsiflexion trace trace  Ankle plantarflexion trace trace  (Blank rows = not tested)   TODAY'S TREATMENT:   There Ex: Ambulation in hallway: 51 ft (CGA, Bariatric Walker, wheelchair follow); 1 rest break, with severe fatigue.    Seated; Dynadisc 10x heel press, 10 toe press 10x; 10x hamstring isometric  GTB row ; 10x 5 second holds GTB D2 pattern 10x each UE GTB abduction 10x Adduction ball squeeze 10x Glute squeeze 10x    PATIENT EDUCATION: Education details: goals, POC, outcome measures Person educated: Patient Education method: Explanation, Demonstration, Tactile cues, and Verbal cues Education comprehension: verbalized understanding, returned demonstration, verbal cues required, and tactile cues required   HOME EXERCISE PROGRAM: Give next session  GOALS: Goals reviewed with patient? Yes  SHORT TERM GOALS: Target date: 03/28/2022  Patient will be independent in home exercise program to improve strength/mobility for better functional independence with ADLs. Baseline:11/9: compliant 12/19: HEP compliant  Goal status: Partially Met  LONG TERM GOALS: Target date: 05/09/2022  Patient will increase FOTO score to equal to or  greater than  52%   to demonstrate statistically significant improvement in mobility and quality of life Baseline: 10/10: 42% 11/9: 40% 12/19: 40%  Goal status: in progress  2.  Patient (> 37 years old) will complete five times sit to stand test in < 15 seconds indicating an increased LE strength and improved balance. Baseline: 10/10: 38 seconds with one plop heavy UE support 11/9: 26 seconds with heavy BUE support 12/19: 37 seconds with BUE support Goal status: In progress  3.  Patient will increase 10 meter walk test to >0.5 m/s as to improve gait speed for better community ambulation and to reduce fall risk. Baseline: 10/10: 54 seconds with bRW andw/c follow 11/9: 43.3 seconds with RW and w/c follow 12/19: 40.9 seconds with RW and w/c follow  Goal status: In progress  4.  Patient will be able to get into bed independently with use of railing to increase independence with mobility and nighttime routine.  Baseline: 10/10: needs complete assistance to bring RLE into bed. 11/9: needs assistance with leg; 12/19: min a for RLE  Goal status: In progress  5.  Patient will go up 2 small steps to enter sisters house with min A.  Baseline: 10/10: unable to perform 11/9: unable to perform on 2 " step  12/18: unable to perform  Goal status: IN PROGRESS   ASSESSMENT:  CLINICAL IMPRESSION: Patient presents with excellent motivation despite fatigue from the holidays. Is aware next week is her recert. Has a procedure next week as well. Her ambulation is limited by full body fatigue requiring early termination.  Patient will continue to benefit from skilled physical therapy intervention to address impairments, improve QOL, and attain therapy goals.    OBJECTIVE IMPAIRMENTS Abnormal gait, decreased activity tolerance, decreased balance, decreased coordination, decreased endurance, decreased mobility, difficulty walking, decreased ROM, decreased strength, hypomobility, increased  edema, impaired perceived  functional ability, increased muscle spasms, impaired flexibility, impaired tone, impaired UE functional use, improper body mechanics, postural dysfunction, obesity, and pain.   ACTIVITY LIMITATIONS carrying, lifting, bending, standing, squatting, sleeping, stairs, transfers, bed mobility, bathing, toileting, dressing, reach over head, locomotion level, and caring for others  PARTICIPATION LIMITATIONS: meal prep, cleaning, laundry, shopping, community activity, and yard work  PERSONAL FACTORS Age, Fitness, Past/current experiences, Sex, Social background, Time since onset of injury/illness/exacerbation, Transportation, and 3+ comorbidities: back pain, CBP, chronic L shoulder pain, galactorrhea, neuropathy, HPV, hypercholestermeia, HTN, MS, osteopenia, PONV, wrist fracture  are also affecting patient's functional outcome.   REHAB POTENTIAL: Fair    CLINICAL DECISION MAKING: Evolving/moderate complexity  EVALUATION COMPLEXITY: Moderate  PLAN: PT FREQUENCY: 2x/week  PT DURATION: 12 weeks  PLANNED INTERVENTIONS: Therapeutic exercises, Therapeutic activity, Neuromuscular re-education, Balance training, Gait training, Patient/Family education, Self Care, Joint mobilization, Stair training, Vestibular training, Canalith repositioning, Visual/preceptual remediation/compensation, Orthotic/Fit training, DME instructions, Dry Needling, Electrical stimulation, Wheelchair mobility training, Spinal mobilization, Cryotherapy, Moist heat, Taping, Vasopneumatic device, Traction, Ultrasound, Ionotophoresis 4mg /ml Dexamethasone, Manual therapy, and Re-evaluation  PLAN FOR NEXT SESSION: standing balance and strengthening, work on ambulation of decline and standing balance with decline.      Precious Bard, PT 05/02/2022, 12:14 PM

## 2022-05-02 ENCOUNTER — Other Ambulatory Visit: Payer: Self-pay

## 2022-05-02 ENCOUNTER — Encounter
Admission: RE | Admit: 2022-05-02 | Discharge: 2022-05-02 | Disposition: A | Discharge status: Home / Self Care | Payer: PPO | Source: Ambulatory Visit | Attending: General Surgery | Admitting: General Surgery

## 2022-05-02 ENCOUNTER — Ambulatory Visit: Payer: PPO

## 2022-05-02 VITALS — Ht 71.0 in | Wt 268.0 lb

## 2022-05-02 DIAGNOSIS — R001 Bradycardia, unspecified: Secondary | ICD-10-CM | POA: Diagnosis not present

## 2022-05-02 DIAGNOSIS — R0989 Other specified symptoms and signs involving the circulatory and respiratory systems: Secondary | ICD-10-CM

## 2022-05-02 DIAGNOSIS — I10 Essential (primary) hypertension: Secondary | ICD-10-CM

## 2022-05-02 DIAGNOSIS — Z01810 Encounter for preprocedural cardiovascular examination: Secondary | ICD-10-CM | POA: Diagnosis not present

## 2022-05-02 DIAGNOSIS — I471 Supraventricular tachycardia, unspecified: Secondary | ICD-10-CM

## 2022-05-02 DIAGNOSIS — R262 Difficulty in walking, not elsewhere classified: Secondary | ICD-10-CM

## 2022-05-02 DIAGNOSIS — M6281 Muscle weakness (generalized): Secondary | ICD-10-CM

## 2022-05-02 DIAGNOSIS — R2681 Unsteadiness on feet: Secondary | ICD-10-CM

## 2022-05-02 HISTORY — DX: Personal history of urinary calculi: Z87.442

## 2022-05-02 NOTE — Patient Instructions (Signed)
Your procedure is scheduled on: 05/10/22 Report to [facility]. You must stop at the Admitting desk first on the right hand side of the 1st floor in the Medical mall Because you are arriving via [facility] we ask that you be here at 9:30 am  Remember: Instructions that are not followed completely may result in serious medical risk, up to and including death, or upon the discretion of your surgeon and anesthesiologist your surgery may need to be rescheduled.     _X__ 1. Do not eat food or drink any liquids after midnight the night before your procedure.                 No gum chewing or hard candies.   __X__2.  On the morning of surgery brush your teeth with toothpaste and water, you                 may rinse your mouth with mouthwash if you wish.  Do not swallow any              toothpaste of mouthwash.     _X__ 3.  No Alcohol for 24 hours before or after surgery.   _X__ 4.  Do Not Smoke or use e-cigarettes For 24 Hours Prior to Your Surgery.                 Do not use any chewable tobacco products for at least 6 hours prior to                 surgery.  ____  5.  Bring all medications with you on the day of surgery if instructed.   __X__  6.  Notify your doctor if there is any change in your medical condition      (cold, fever, infections).     Do not wear jewelry, make-up, hairpins, clips or nail polish. Do not wear lotions, powders, or perfumes. You may wear deodorant. Do not shave body hair 48 hours prior to surgery. Men may shave face and neck. Do not bring valuables to the hospital.    [facility] is not responsible for any belongings or valuables.  Contacts, dentures/partials or body piercings may not be worn into surgery. Bring a case for your contacts, glasses or hearing aids, a denture cup will be supplied. Leave your suitcase in the car. After surgery it may be brought to your room. For patients admitted to the hospital,  discharge time is determined by your treatment team.   Patients discharged the day of surgery will not be allowed to drive home.   Please read over the following fact sheets that you were given:   SAGE wipes  __X__ Take these medicines the morning of surgery with A SIP OF WATER:    1. baclofen (LIORESAL) 20 MG tablet   2. gabapentin (NEURONTIN) 100 MG capsule   3. oxybutynin (DITROPAN-XL) 10 MG 24 hr tablet   4. Siponimod Fumarate (MAYZENT) 2 MG TABS   5. tiZANidine (ZANAFLEX) 4 MG tablet   6.  ____ Fleet Enema (as directed)   __X__ Use CHG Soap/SAGE wipes as directed  ____ Use inhalers on the day of surgery  ____ Stop metformin/Janumet/Farxiga 2 days prior to surgery    ____ Take 1/2 of usual insulin dose the night before surgery. No insulin the morning          of surgery.   ____ Stop Blood Thinners  Coumadin/Plavix/Xarelto/Pleta/Pradaxa/Eliquis/Effient/Aspirin  on   Or contact your Surgeon, Cardiologist or Medical Doctor regarding  ability to stop your blood thinners  __X__ Stop Anti-inflammatories 7 days before surgery such as Advil, Ibuprofen, Motrin,  BC or Goodies Powder, Naprosyn, Naproxen, Aleve, Aspirin    __X__ Stop all herbals and supplements, fish oil or vitamins  until after surgery.    ____ Bring C-Pap to the hospital.

## 2022-05-03 NOTE — Therapy (Signed)
OUTPATIENT PHYSICAL THERAPY NEURO TREATMENT  Patient Name: Lisa Williams MRN: [id number] DOB:01/02/1961, 61 y.o., female Today's Date: 05/04/2022  PCP: Kirk Ruths  REFERRING PROVIDER: Britt Bottom MD   PT End of Session - 05/04/22 1009     Visit Number 23    Number of Visits 24    Date for PT Re-Evaluation 05/09/22    Authorization Type 2/10 PN 12/19    PT Start Time 1015    PT Stop Time 1059    PT Time Calculation (min) 44 min    Equipment Utilized During Treatment Gait belt    Activity Tolerance Patient tolerated treatment well;Patient limited by fatigue    Behavior During Therapy [facility] for tasks assessed/performed                Past Medical History:  Diagnosis Date   Abdominal pain, right upper quadrant    Back pain    Calculus of kidney 12/09/2013   Chronic back pain    unspecified   Chronic left shoulder pain 82/42/3536   Complication of anesthesia    Functional disorder of bladder    other   Galactorrhea 11/26/2014   Chronic    Hereditary and idiopathic neuropathy 08/19/2013   History of kidney stones    HPV test positive    Hypercholesteremia 08/19/2013   Hypertension    Incomplete bladder emptying    Microscopic hematuria    MS (multiple sclerosis) (HCC)    Muscle spasticity 05/21/2014   Nonspecific findings on examination of urine    other   Osteopenia    PONV (postoperative nausea and vomiting)    Status post laparoscopic supracervical hysterectomy 11/26/2014   Tobacco user 11/26/2014   Wrist fracture    Past Surgical History:  Procedure Laterality Date   bilateral tubal ligation  1996   BREAST CYST EXCISION Left 2002   FRACTURE SURGERY     KNEE SURGERY     right   LAPAROSCOPIC SUPRACERVICAL HYSTERECTOMY  08/05/2013   ORIF WRIST FRACTURE Left 01/17/2017   Procedure: OPEN REDUCTION INTERNAL FIXATION (ORIF) WRIST FRACTURE;  Surgeon: Lovell Sheehan, MD;  Location: [facility];  Service: Orthopedics;  Laterality: Left;    RADIOLOGY WITH ANESTHESIA N/A 03/18/2020   Procedure: MRI WITH ANESTHESIA CERVICAL SPINE AND BRAIN  WITH AND WITHOUT CONTRAST;  Surgeon: Radiologist, Medication, MD;  Location: [place];  Service: Radiology;  Laterality: N/A;   TUBAL LIGATION Bilateral    VAGINAL HYSTERECTOMY  03/2006   Patient Active Problem List   Diagnosis Date Noted   Abscess of left groin 11/15/2021   Abnormal LFTs 11/15/2021   Acute respiratory disease due to COVID-19 virus 11/21/2020   Weakness    Hypoalbuminemia due to protein-calorie malnutrition (HCC)    Neurogenic bowel    Neurogenic bladder    Labile blood pressure    Neuropathic pain    Abscess of female pelvis    SVT (supraventricular tachycardia)    Radial styloid tenosynovitis 03/12/2018   Wheelchair confinement 02/27/2018   Localized osteoporosis with current pathological fracture with routine healing 01/19/2017   Wrist fracture 01/16/2017   Sprain of ankle 03/23/2016   Closed fracture of lateral malleolus 03/16/2016   Health care maintenance 01/24/2016   Blood pressure elevated without history of HTN 10/25/2015   Essential hypertension 10/25/2015   Multiple sclerosis ([place]) 10/02/2015   Chronic left shoulder pain 07/19/2015   Multiple sclerosis exacerbation ([place]) 07/14/2015   MS (multiple sclerosis) ([place]) 11/26/2014   Increased body  mass index 11/26/2014   HPV test positive 11/26/2014   Status post laparoscopic supracervical hysterectomy 11/26/2014   Galactorrhea 11/26/2014   Back ache 05/21/2014   Adiposity 05/21/2014   Disordered sleep 05/21/2014   Muscle spasticity 05/21/2014   Spasticity 05/21/2014   Calculus of kidney 94/17/4081   Renal colic 44/81/8563   Hypercholesteremia 08/19/2013   Hereditary and idiopathic neuropathy 08/19/2013   Hypercholesterolemia without hypertriglyceridemia 08/19/2013   Bladder infection, chronic 07/25/2012   Disorder of bladder function 07/25/2012   Incomplete bladder emptying 07/25/2012   Microscopic  hematuria 07/25/2012   Right upper quadrant pain 07/25/2012   ONSET DATE: 1995  REFERRING DIAG: MS  THERAPY DIAG:  Muscle weakness (generalized)  Difficulty in walking, not elsewhere classified  Unsteadiness on feet  Rationale for Evaluation and Treatment Rehabilitation  SUBJECTIVE:                                                                                                                                                                                          SUBJECTIVE STATEMENT: Patient has surgery next week; will have to cancel next week's appointments   .   Pt accompanied by: self  PERTINENT HISTORY: Patient is retuning to PT after hospitalization on 11/14/21 where she then went to SNF and then received home health therapy which stopped 02/08/22. Patient has weakness in BLE with RLE>LLE. She drives with hand controls. Patient has been diagnosed with MS in 1995.  PMH includes: back pain, CBP, chronic L shoulder pain, galactorrhea, neuropathy, HPV, hypercholestermeia, HTN, MS, osteopenia, PONV, wrist fracture. Started back driving about two weeks ago.   PAIN:  Are you having pain? Yes: NPRS scale: 2/10 Pain location: abdomen to R leg Pain description: spasm Aggravating factors: unknown Relieving factors: n/a  PRECAUTIONS: Fall  WEIGHT BEARING RESTRICTIONS No  FALLS: Has patient fallen in last 6 months? No  LIVING ENVIRONMENT: Lives with: lives with their family Lives in: House/apartment Stairs: Yes: Internal: full flight steps; but lives fully on first floor Has following equipment at home: Gilford Rile - 2 wheeled, Wheelchair (manual), shower chair, Grab bars, and Ramped entry  PLOF: Independent with household mobility with device  PATIENT GOALS be able to get into the bed without assistance, improve ambulation, standing tolerance, going up steps.   OBJECTIVE:   DIAGNOSTIC FINDINGS: MRI of the brain 03/18/2020 showed multiple T2/FLAIR hyperintense foci in the  periventricular, juxtacortical and deep white matter.  There were no infratentorial lesions noted.  None of the foci enhanced.   Due to severe claustrophobia, the study was done with conscious sedation in the hospital  MRI of the cervical spine 03/18/2020 showed T2  hyperintense foci at C1-C2, C2-C3, C4-C5, C5-C6 and T1.  There is a disc protrusion at C5-C6 to the left and a larger disc protrusion at C6-C7 to the right that distorts the thecal sac.  The adjacent spinal cord has normal signal.  There could be compression of the right C7 nerve root.   LOWER EXTREMITY MMT:    MMT Right Eval Left Eval  Hip flexion 2- 2  Hip extension 2- 2  Hip abduction 2+ 2+  Hip adduction 2+ 2+  Knee flexion 2- 2  Knee extension 2 2  Ankle dorsiflexion trace trace  Ankle plantarflexion trace trace  (Blank rows = not tested)   TODAY'S TREATMENT:   There Ex: Ambulation on incline: 30 ft; attempted second trial with L knee buckling required termination of ambulation.   Seated boxing: cues for sequencing, body mechanics, and pace/rhythm for functional contraction and timing of muscle recruitment: -Cross body punches to mitts on PT hands with no back support for focused core stabilization with crossing midline 2x 60 seconds  -Cross body punch with secondary combination of elbow for full cross body rotation to PT mitt for trunk stability, coordination, and cardiovascular challenge x 60 seconds -Upper cut to mitts in PT hands for core activation without back support with perturbations 60 seconds  -modified windmill 10x each side -TrA activation 10x hold 3 seconds    PATIENT EDUCATION: Education details: goals, POC, outcome measures Person educated: Patient Education method: Explanation, Demonstration, Tactile cues, and Verbal cues Education comprehension: verbalized understanding, returned demonstration, verbal cues required, and tactile cues required   HOME EXERCISE PROGRAM: Give next  session  GOALS: Goals reviewed with patient? Yes  SHORT TERM GOALS: Target date: 03/28/2022  Patient will be independent in home exercise program to improve strength/mobility for better functional independence with ADLs. Baseline:11/9: compliant 12/19: HEP compliant  Goal status: Partially Met  LONG TERM GOALS: Target date: 05/09/2022  Patient will increase FOTO score to equal to or greater than  52%   to demonstrate statistically significant improvement in mobility and quality of life Baseline: 10/10: 42% 11/9: 40% 12/19: 40%  Goal status: in progress  2.  Patient (> 45 years old) will complete five times sit to stand test in < 15 seconds indicating an increased LE strength and improved balance. Baseline: 10/10: 38 seconds with one plop heavy UE support 11/9: 26 seconds with heavy BUE support 12/19: 37 seconds with BUE support Goal status: In progress  3.  Patient will increase 10 meter walk test to >0.5 m/s as to improve gait speed for better community ambulation and to reduce fall risk. Baseline: 10/10: 54 seconds with bRW andw/c follow 11/9: 43.3 seconds with RW and w/c follow 12/19: 40.9 seconds with RW and w/c follow  Goal status: In progress  4.  Patient will be able to get into bed independently with use of railing to increase independence with mobility and nighttime routine.  Baseline: 10/10: needs complete assistance to bring RLE into bed. 11/9: needs assistance with leg; 12/19: min a for RLE  Goal status: In progress  5.  Patient will go up 2 small steps to enter sisters house with min A.  Baseline: 10/10: unable to perform 11/9: unable to perform on 2 " step  12/18: unable to perform  Goal status: IN PROGRESS   ASSESSMENT:  CLINICAL IMPRESSION: Patient has buckling of L leg with ambulation this session requiring termination of ambulation. She is aware that next session she will be performing goals  for recert. Patient is having a procedure next week and will miss PT.   Patient will continue to benefit from skilled physical therapy intervention to address impairments, improve QOL, and attain therapy goals.    OBJECTIVE IMPAIRMENTS Abnormal gait, decreased activity tolerance, decreased balance, decreased coordination, decreased endurance, decreased mobility, difficulty walking, decreased ROM, decreased strength, hypomobility, increased edema, impaired perceived functional ability, increased muscle spasms, impaired flexibility, impaired tone, impaired UE functional use, improper body mechanics, postural dysfunction, obesity, and pain.   ACTIVITY LIMITATIONS carrying, lifting, bending, standing, squatting, sleeping, stairs, transfers, bed mobility, bathing, toileting, dressing, reach over head, locomotion level, and caring for others  PARTICIPATION LIMITATIONS: meal prep, cleaning, laundry, shopping, community activity, and yard work  PERSONAL FACTORS Age, Fitness, Past/current experiences, Sex, Social background, Time since onset of injury/illness/exacerbation, Transportation, and 3+ comorbidities: back pain, CBP, chronic L shoulder pain, galactorrhea, neuropathy, HPV, hypercholestermeia, HTN, MS, osteopenia, PONV, wrist fracture  are also affecting patient's functional outcome.   REHAB POTENTIAL: Fair    CLINICAL DECISION MAKING: Evolving/moderate complexity  EVALUATION COMPLEXITY: Moderate  PLAN: PT FREQUENCY: 2x/week  PT DURATION: 12 weeks  PLANNED INTERVENTIONS: Therapeutic exercises, Therapeutic activity, Neuromuscular re-education, Balance training, Gait training, Patient/Family education, Self Care, Joint mobilization, Stair training, Vestibular training, Canalith repositioning, Visual/preceptual remediation/compensation, Orthotic/Fit training, DME instructions, Dry Needling, Electrical stimulation, Wheelchair mobility training, Spinal mobilization, Cryotherapy, Moist heat, Taping, Vasopneumatic device, Traction, Ultrasound, Ionotophoresis 36m/ml  Dexamethasone, Manual therapy, and Re-evaluation  PLAN FOR NEXT SESSION: standing balance and strengthening, work on ambulation of decline and standing balance with decline.      MJanna Arch PT 05/04/2022, 1:08 PM

## 2022-05-04 ENCOUNTER — Ambulatory Visit: Payer: PPO

## 2022-05-04 ENCOUNTER — Encounter: Payer: Self-pay | Admitting: Neurology

## 2022-05-04 DIAGNOSIS — R2681 Unsteadiness on feet: Secondary | ICD-10-CM

## 2022-05-04 DIAGNOSIS — M6281 Muscle weakness (generalized): Secondary | ICD-10-CM

## 2022-05-04 DIAGNOSIS — R262 Difficulty in walking, not elsewhere classified: Secondary | ICD-10-CM

## 2022-05-09 ENCOUNTER — Ambulatory Visit: Payer: PPO

## 2022-05-09 MED ORDER — FAMOTIDINE 20 MG PO TABS: 20.0000 mg | ORAL_TABLET | Freq: Once | ORAL | Status: AC

## 2022-05-09 MED ORDER — CEFAZOLIN SODIUM-DEXTROSE 2-4 GM/100ML-% IV SOLN
2.0000 g | INTRAVENOUS | Status: AC
  Administered 2022-05-10: 2 g via INTRAVENOUS

## 2022-05-09 MED ORDER — LACTATED RINGERS IV SOLN: INTRAVENOUS | Status: DC

## 2022-05-10 ENCOUNTER — Encounter: Payer: Self-pay | Admitting: General Surgery

## 2022-05-10 ENCOUNTER — Ambulatory Visit: Payer: PPO | Admitting: Certified Registered"

## 2022-05-10 ENCOUNTER — Ambulatory Visit
Admission: RE | Admit: 2022-05-10 | Discharge: 2022-05-10 | Disposition: A | Discharge status: Home / Self Care | Payer: PPO | Attending: General Surgery | Admitting: General Surgery

## 2022-05-10 ENCOUNTER — Other Ambulatory Visit: Payer: Self-pay

## 2022-05-10 ENCOUNTER — Encounter
Admission: RE | Disposition: A | Discharge status: Home / Self Care | Payer: Self-pay | Source: Home / Self Care | Attending: General Surgery

## 2022-05-10 DIAGNOSIS — L720 Epidermal cyst: Secondary | ICD-10-CM | POA: Insufficient documentation

## 2022-05-10 DIAGNOSIS — G609 Hereditary and idiopathic neuropathy, unspecified: Secondary | ICD-10-CM | POA: Diagnosis not present

## 2022-05-10 DIAGNOSIS — I10 Essential (primary) hypertension: Secondary | ICD-10-CM | POA: Insufficient documentation

## 2022-05-10 DIAGNOSIS — G35 Multiple sclerosis: Secondary | ICD-10-CM | POA: Diagnosis not present

## 2022-05-10 DIAGNOSIS — E7800 Pure hypercholesterolemia, unspecified: Secondary | ICD-10-CM | POA: Insufficient documentation

## 2022-05-10 DIAGNOSIS — Z6837 Body mass index (BMI) 37.0-37.9, adult: Secondary | ICD-10-CM | POA: Diagnosis not present

## 2022-05-10 DIAGNOSIS — Z87891 Personal history of nicotine dependence: Secondary | ICD-10-CM | POA: Diagnosis not present

## 2022-05-10 DIAGNOSIS — Z90710 Acquired absence of both cervix and uterus: Secondary | ICD-10-CM | POA: Diagnosis not present

## 2022-05-10 DIAGNOSIS — E669 Obesity, unspecified: Secondary | ICD-10-CM | POA: Diagnosis not present

## 2022-05-10 HISTORY — PX: CYST EXCISION: SHX5701

## 2022-05-10 SURGERY — CYST REMOVAL
Anesthesia: General | Site: Groin | Laterality: Left | Wound class: Clean

## 2022-05-10 MED ORDER — PROPOFOL 10 MG/ML IV BOLUS
INTRAVENOUS | Status: AC
  Filled 2022-05-10: qty 20

## 2022-05-10 MED ORDER — ONDANSETRON HCL 4 MG/2ML IJ SOLN
INTRAMUSCULAR | Status: AC
  Filled 2022-05-10: qty 2

## 2022-05-10 MED ORDER — CEFAZOLIN SODIUM-DEXTROSE 2-4 GM/100ML-% IV SOLN
INTRAVENOUS | Status: AC
  Filled 2022-05-10: qty 100

## 2022-05-10 MED ORDER — PROPOFOL 500 MG/50ML IV EMUL
INTRAVENOUS | Status: DC | PRN
  Administered 2022-05-10: 125 ug/kg/min via INTRAVENOUS

## 2022-05-10 MED ORDER — FENTANYL CITRATE (PF) 100 MCG/2ML IJ SOLN
INTRAMUSCULAR | Status: AC
  Filled 2022-05-10: qty 2

## 2022-05-10 MED ORDER — PROPOFOL 1000 MG/100ML IV EMUL
INTRAVENOUS | Status: AC
  Filled 2022-05-10: qty 100

## 2022-05-10 MED ORDER — DEXAMETHASONE SODIUM PHOSPHATE 10 MG/ML IJ SOLN
INTRAMUSCULAR | Status: AC
  Filled 2022-05-10: qty 1

## 2022-05-10 MED ORDER — FENTANYL CITRATE (PF) 100 MCG/2ML IJ SOLN
INTRAMUSCULAR | Status: DC | PRN
  Administered 2022-05-10: 25 ug via INTRAVENOUS

## 2022-05-10 MED ORDER — DEXMEDETOMIDINE HCL IN NACL 200 MCG/50ML IV SOLN
INTRAVENOUS | Status: DC | PRN
  Administered 2022-05-10: 8 ug via INTRAVENOUS

## 2022-05-10 MED ORDER — LIDOCAINE HCL (PF) 2 % IJ SOLN
INTRAMUSCULAR | Status: AC
  Filled 2022-05-10: qty 5

## 2022-05-10 MED ORDER — FAMOTIDINE 20 MG PO TABS
ORAL_TABLET | ORAL | Status: AC
  Administered 2022-05-10: 20 mg via ORAL
  Filled 2022-05-10: qty 1

## 2022-05-10 MED ORDER — MIDAZOLAM HCL 2 MG/2ML IJ SOLN
INTRAMUSCULAR | Status: AC
  Filled 2022-05-10: qty 2

## 2022-05-10 MED ORDER — ONDANSETRON HCL 4 MG/2ML IJ SOLN
INTRAMUSCULAR | Status: DC | PRN
  Administered 2022-05-10: 4 mg via INTRAVENOUS

## 2022-05-10 MED ORDER — BUPIVACAINE-EPINEPHRINE (PF) 0.5% -1:200000 IJ SOLN
INTRAMUSCULAR | Status: AC
  Filled 2022-05-10: qty 30

## 2022-05-10 MED ORDER — HYDROCODONE-ACETAMINOPHEN 5-325 MG PO TABS: 1.0000 | ORAL_TABLET | ORAL | 0 refills | Status: AC | PRN

## 2022-05-10 MED ORDER — FENTANYL CITRATE (PF) 100 MCG/2ML IJ SOLN: 25.0000 ug | INTRAMUSCULAR | Status: DC | PRN

## 2022-05-10 MED ORDER — MIDAZOLAM HCL 2 MG/2ML IJ SOLN
INTRAMUSCULAR | Status: DC | PRN
  Administered 2022-05-10 (×2): 1 mg via INTRAVENOUS

## 2022-05-10 MED ORDER — PHENYLEPHRINE 80 MCG/ML (10ML) SYRINGE FOR IV PUSH (FOR BLOOD PRESSURE SUPPORT)
PREFILLED_SYRINGE | INTRAVENOUS | Status: AC
  Filled 2022-05-10: qty 10

## 2022-05-10 MED ORDER — DEXAMETHASONE SODIUM PHOSPHATE 10 MG/ML IJ SOLN
INTRAMUSCULAR | Status: DC | PRN
  Administered 2022-05-10: 10 mg via INTRAVENOUS

## 2022-05-10 MED ORDER — BUPIVACAINE-EPINEPHRINE 0.5% -1:200000 IJ SOLN
INTRAMUSCULAR | Status: DC | PRN
  Administered 2022-05-10: 20 mL

## 2022-05-10 MED ORDER — PHENYLEPHRINE 80 MCG/ML (10ML) SYRINGE FOR IV PUSH (FOR BLOOD PRESSURE SUPPORT)
PREFILLED_SYRINGE | INTRAVENOUS | Status: DC | PRN
  Administered 2022-05-10: 80 ug via INTRAVENOUS

## 2022-05-10 SURGICAL SUPPLY — 26 items
CHLORAPREP W/TINT 26 (MISCELLANEOUS) ×1 IMPLANT
DERMABOND ADVANCED .7 DNX12 (GAUZE/BANDAGES/DRESSINGS) ×1 IMPLANT
DRAPE LAPAROTOMY 100X77 ABD (DRAPES) ×1 IMPLANT
ELECT CAUTERY BLADE 6.4 (BLADE) ×1 IMPLANT
ELECT REM PT RETURN 9FT ADLT (ELECTROSURGICAL) ×1
ELECTRODE REM PT RTRN 9FT ADLT (ELECTROSURGICAL) ×1 IMPLANT
GLOVE BIO SURGEON STRL SZ 6.5 (GLOVE) ×1 IMPLANT
GLOVE BIOGEL PI IND STRL 6.5 (GLOVE) ×1 IMPLANT
GOWN STRL REUS W/ TWL LRG LVL3 (GOWN DISPOSABLE) IMPLANT
GOWN STRL REUS W/TWL LRG LVL3 (GOWN DISPOSABLE) ×3
KIT TURNOVER KIT A (KITS) ×1 IMPLANT
LABEL OR SOLS (LABEL) ×1 IMPLANT
MANIFOLD NEPTUNE II (INSTRUMENTS) ×1 IMPLANT
NDL HYPO 25X1 1.5 SAFETY (NEEDLE) ×1 IMPLANT
NEEDLE HYPO 25X1 1.5 SAFETY (NEEDLE) ×1 IMPLANT
NS IRRIG 500ML POUR BTL (IV SOLUTION) ×1 IMPLANT
PACK BASIN MINOR ARMC (MISCELLANEOUS) ×1 IMPLANT
SUT ETHILON 3-0 (SUTURE) IMPLANT
SUT MNCRL 4-0 (SUTURE) ×1
SUT MNCRL 4-0 27XMFL (SUTURE) ×1
SUT VIC AB 3-0 SH 27 (SUTURE) ×1
SUT VIC AB 3-0 SH 27X BRD (SUTURE) ×1 IMPLANT
SUTURE MNCRL 4-0 27XMF (SUTURE) ×1 IMPLANT
SYR 10ML LL (SYRINGE) ×1 IMPLANT
TRAP FLUID SMOKE EVACUATOR (MISCELLANEOUS) ×1 IMPLANT
WATER STERILE IRR 500ML POUR (IV SOLUTION) ×1 IMPLANT

## 2022-05-10 NOTE — Anesthesia Preprocedure Evaluation (Signed)
Anesthesia Evaluation  Patient identified by MRN, date of birth, ID band Patient awake    Reviewed: Allergy & Precautions, H&P , NPO status , Patient's Chart, lab work & pertinent test results, reviewed documented beta blocker date and time   History of Anesthesia Complications (+) PONV and history of anesthetic complications  Airway Mallampati: I  TM Distance: >3 FB Neck ROM: full    Dental  (+) Dental Advidsory Given, Teeth Intact, Missing   Pulmonary neg pulmonary ROS, former smoker   Pulmonary exam normal breath sounds clear to auscultation       Cardiovascular Exercise Tolerance: Good hypertension, (-) angina (-) Past MI and (-) Cardiac Stents Normal cardiovascular exam(-) dysrhythmias (-) Valvular Problems/Murmurs Rhythm:regular Rate:Normal     Neuro/Psych neg Seizures  Neuromuscular disease (MS)  negative psych ROS   GI/Hepatic negative GI ROS, Neg liver ROS,,,  Endo/Other  negative endocrine ROS    Renal/GU negative Renal ROS  negative genitourinary   Musculoskeletal   Abdominal   Peds  Hematology negative hematology ROS (+)   Anesthesia Other Findings Past Medical History: No date: Abdominal pain, right upper quadrant No date: Back pain 12/09/2013: Calculus of kidney No date: Chronic back pain     Comment:  unspecified 07/19/2015: Chronic left shoulder pain No date: Complication of anesthesia No date: Functional disorder of bladder     Comment:  other 11/26/2014: Galactorrhea     Comment:  Chronic  08/19/2013: Hereditary and idiopathic neuropathy No date: History of kidney stones No date: HPV test positive 08/19/2013: Hypercholesteremia No date: Hypertension No date: Incomplete bladder emptying No date: Microscopic hematuria No date: MS (multiple sclerosis) ([place]) 05/21/2014: Muscle spasticity No date: Nonspecific findings on examination of urine     Comment:  other No date: Osteopenia No  date: PONV (postoperative nausea and vomiting) 11/26/2014: Status post laparoscopic supracervical hysterectomy 11/26/2014: Tobacco user No date: Wrist fracture   Reproductive/Obstetrics negative OB ROS                             Anesthesia Physical Anesthesia Plan  ASA: 3  Anesthesia Plan: General   Post-op Pain Management:    Induction: Intravenous  PONV Risk Score and Plan: 4 or greater and Propofol infusion, TIVA and Treatment may vary due to age or medical condition  Airway Management Planned: Natural Airway and Simple Face Mask  Additional Equipment:   Intra-op Plan:   Post-operative Plan:   Informed Consent: I have reviewed the patients History and Physical, chart, labs and discussed the procedure including the risks, benefits and alternatives for the proposed anesthesia with the patient or authorized representative who has indicated his/her understanding and acceptance.     Dental Advisory Given  Plan Discussed with: Anesthesiologist, CRNA and Surgeon  Anesthesia Plan Comments: (Plan for propofol GA with natural airway, but will proceed to TIVA with an LMA if she in unable to tolerate.)        Anesthesia Quick Evaluation

## 2022-05-10 NOTE — Transfer of Care (Signed)
Immediate Anesthesia Transfer of Care Note  Patient: Lisa Williams  Procedure(s) Performed: CYST REMOVAL (Left: Groin)  Patient Location: PACU  Anesthesia Type:MAC  Level of Consciousness: awake and drowsy  Airway & Oxygen Therapy: Patient Spontanous Breathing and Patient connected to nasal cannula oxygen  Post-op Assessment: Report given to RN and Post -op Vital signs reviewed and stable  Post vital signs: stable  Last Vitals:  Vitals Value Taken Time  BP 145/82 05/10/22 1225  Temp    Pulse 72 05/10/22 1228  Resp 15 05/10/22 1228  SpO2 100 % 05/10/22 1228  Vitals shown include unvalidated device data.  Last Pain:  Vitals:   05/10/22 1057  TempSrc: Temporal  PainSc: 0-No pain         Complications: No notable events documented.

## 2022-05-10 NOTE — Discharge Instructions (Addendum)
?  Diet: Resume home heart healthy regular diet.  ? ?Activity: No activity restrictions.  ? ?Wound care: May shower with soapy water and pat dry (do not rub incisions), but no baths or submerging incision underwater until follow-up. (no swimming)  ? ?Medications: Resume all home medications. For mild to moderate pain: acetaminophen (Tylenol) or ibuprofen (if no kidney disease). Combining Tylenol with alcohol can substantially increase your risk of causing liver disease. Narcotic pain medications, if prescribed, can be used for severe pain, though may cause nausea, constipation, and drowsiness. Do not combine Tylenol and Norco within a 6 hour period as Norco contains Tylenol. If you do not need the narcotic pain medication, you do not need to fill the prescription. ? ?Call office ([phone]) at any time if any questions, worsening pain, fevers/chills, bleeding, drainage from incision site, or other concerns. ? ? ?AMBULATORY SURGERY  ?DISCHARGE INSTRUCTIONS ? ? ?The drugs that you were given will stay in your system until tomorrow so for the next 24 hours you should not: ? ?Drive an automobile ?Make any legal decisions ?Drink any alcoholic beverage ? ? ?You may resume regular meals tomorrow.  Today it is better to start with liquids and gradually work up to solid foods. ? ?You may eat anything you prefer, but it is better to start with liquids, then soup and crackers, and gradually work up to solid foods. ? ? ?Please notify your doctor immediately if you have any unusual bleeding, trouble breathing, redness and pain at the surgery site, drainage, fever, or pain not relieved by medication. ? ? ? ?Additional Instructions: ? ? ? ? ? ? ? ?Please contact your physician with any problems or Same Day Surgery at [phone], Monday through Friday 6 am to 4 pm, or [place] at  Main number at [phone].  ?

## 2022-05-10 NOTE — Interval H&P Note (Signed)
History and Physical Interval Note:  05/10/2022 11:23 AM  Lisa Williams  has presented today for surgery, with the diagnosis of L72.0 Epidermal inclusion cyst.  The various methods of treatment have been discussed with the patient and family. After consideration of risks, benefits and other options for treatment, the patient has consented to  Procedure(s): CYST REMOVAL (Left) as a surgical intervention.  The patient's history has been reviewed, patient examined, no change in status, stable for surgery.  I have reviewed the patient's chart and labs.  Questions were answered to the patient's satisfaction.     Herbert Pun

## 2022-05-10 NOTE — Op Note (Signed)
OPERATION REPORT  Pre Operative Diagnosis: Left inguinal cyst  Post operative diagnosis: Left inguinal cyst  Anesthesia: MAC and Local   Surgeon: Dr. Windell Moment  Procedure: Excision of left inguinal cyst   Indication: This 62 y.o. year old female with a soft tissue mass that is causing recurrent infections.    Description of procedure: after orienting patient about the procedure steps and benefits and patient agreed to proceed. Time out was done identifying correct patient and location of procedure. After induction of monitored sedation, local anesthesia was infiltrated around the palpable lesion on the left groin. With a blade #15, an elliptical incision was made using the skin lines. Sharp dissection was carried down and lesion was excised including dermal tissue. The mass measured 5 cm. Deep dermal stitches were done with vicryl 4-0 to repair the laceration and skin closed with Monocryl 4-0 in subcuticular fashion. Specimen sent to pathology.    Complications: none   EBL: minimal  Herbert Pun, MD, FACS

## 2022-05-11 ENCOUNTER — Encounter: Payer: Self-pay | Admitting: General Surgery

## 2022-05-11 ENCOUNTER — Ambulatory Visit: Payer: PPO

## 2022-05-12 ENCOUNTER — Telehealth: Payer: Self-pay | Admitting: *Deleted

## 2022-05-12 ENCOUNTER — Ambulatory Visit
Admission: RE | Admit: 2022-05-12 | Discharge: 2022-05-12 | Disposition: A | Discharge status: Home / Self Care | Payer: PPO | Source: Ambulatory Visit | Attending: Internal Medicine | Admitting: Internal Medicine

## 2022-05-12 DIAGNOSIS — Z1231 Encounter for screening mammogram for malignant neoplasm of breast: Secondary | ICD-10-CM | POA: Insufficient documentation

## 2022-05-12 NOTE — Telephone Encounter (Signed)
Received paper request for [place]. Key: [id number]. Tried submitting on covermymeds but received the following response back: " Prior Authorization duplicate/approved" No further PA approval info provided.

## 2022-05-15 LAB — SURGICAL PATHOLOGY

## 2022-05-15 NOTE — Therapy (Signed)
OUTPATIENT PHYSICAL THERAPY NEURO TREATMENT/RECERT  Patient Name: Lisa Williams MRN: [id number] DOB:Aug 28, 1960, 62 y.o., female Today's Date: 05/16/2022  PCP: Lauro Regulus  REFERRING PROVIDER: Asa Lente MD   PT End of Session - 05/16/22 0952     Visit Number 24    Number of Visits 48    Date for PT Re-Evaluation 08/08/22    Authorization Type 4/10 PN 12/19    PT Start Time 1000    PT Stop Time 1046    PT Time Calculation (min) 46 min    Equipment Utilized During Treatment Gait belt    Activity Tolerance Patient tolerated treatment well;Patient limited by fatigue    Behavior During Therapy [facility] for tasks assessed/performed                 Past Medical History:  Diagnosis Date   Abdominal pain, right upper quadrant    Back pain    Calculus of kidney 12/09/2013   Chronic back pain    unspecified   Chronic left shoulder pain 07/19/2015   Complication of anesthesia    Functional disorder of bladder    other   Galactorrhea 11/26/2014   Chronic    Hereditary and idiopathic neuropathy 08/19/2013   History of kidney stones    HPV test positive    Hypercholesteremia 08/19/2013   Hypertension    Incomplete bladder emptying    Microscopic hematuria    MS (multiple sclerosis) (HCC)    Muscle spasticity 05/21/2014   Nonspecific findings on examination of urine    other   Osteopenia    PONV (postoperative nausea and vomiting)    Status post laparoscopic supracervical hysterectomy 11/26/2014   Tobacco user 11/26/2014   Wrist fracture    Past Surgical History:  Procedure Laterality Date   bilateral tubal ligation  1996   BREAST CYST EXCISION Left 2002   CYST EXCISION Left 05/10/2022   Procedure: CYST REMOVAL;  Surgeon: Carolan Shiver, MD;  Location: [facility] ORS;  Service: General;  Laterality: Left;   FRACTURE SURGERY     KNEE SURGERY     right   LAPAROSCOPIC SUPRACERVICAL HYSTERECTOMY  08/05/2013   ORIF WRIST FRACTURE Left 01/17/2017    Procedure: OPEN REDUCTION INTERNAL FIXATION (ORIF) WRIST FRACTURE;  Surgeon: Lyndle Herrlich, MD;  Location: [facility] ORS;  Service: Orthopedics;  Laterality: Left;   RADIOLOGY WITH ANESTHESIA N/A 03/18/2020   Procedure: MRI WITH ANESTHESIA CERVICAL SPINE AND BRAIN  WITH AND WITHOUT CONTRAST;  Surgeon: Radiologist, Medication, MD;  Location: [facility] OR;  Service: Radiology;  Laterality: N/A;   TUBAL LIGATION Bilateral    VAGINAL HYSTERECTOMY  03/2006   Patient Active Problem List   Diagnosis Date Noted   Abscess of left groin 11/15/2021   Abnormal LFTs 11/15/2021   Acute respiratory disease due to COVID-19 virus 11/21/2020   Weakness    Hypoalbuminemia due to protein-calorie malnutrition (HCC)    Neurogenic bowel    Neurogenic bladder    Labile blood pressure    Neuropathic pain    Abscess of female pelvis    SVT (supraventricular tachycardia)    Radial styloid tenosynovitis 03/12/2018   Wheelchair confinement 02/27/2018   Localized osteoporosis with current pathological fracture with routine healing 01/19/2017   Wrist fracture 01/16/2017   Sprain of ankle 03/23/2016   Closed fracture of lateral malleolus 03/16/2016   Health care maintenance 01/24/2016   Blood pressure elevated without history of HTN 10/25/2015   Essential hypertension 10/25/2015   Multiple sclerosis (  [place]) 10/02/2015   Chronic left shoulder pain 07/19/2015   Multiple sclerosis exacerbation ([place]) 07/14/2015   MS (multiple sclerosis) ([place]) 11/26/2014   Increased body mass index 11/26/2014   HPV test positive 11/26/2014   Status post laparoscopic supracervical hysterectomy 11/26/2014   Galactorrhea 11/26/2014   Back ache 05/21/2014   Adiposity 05/21/2014   Disordered sleep 05/21/2014   Muscle spasticity 05/21/2014   Spasticity 05/21/2014   Calculus of kidney 56/43/3295   Renal colic 18/84/1660   Hypercholesteremia 08/19/2013   Hereditary and idiopathic neuropathy 08/19/2013   Hypercholesterolemia without  hypertriglyceridemia 08/19/2013   Bladder infection, chronic 07/25/2012   Disorder of bladder function 07/25/2012   Incomplete bladder emptying 07/25/2012   Microscopic hematuria 07/25/2012   Right upper quadrant pain 07/25/2012   ONSET DATE: 1995  REFERRING DIAG: MS  THERAPY DIAG:  Muscle weakness (generalized)  Difficulty in walking, not elsewhere classified  Unsteadiness on feet  Rationale for Evaluation and Treatment Rehabilitation  SUBJECTIVE:                                                                                                                                                                                          SUBJECTIVE STATEMENT: Patient is returning to PT after procedure last week. Has been resting but has noticed some weakness  .   Pt accompanied by: self  PERTINENT HISTORY: Patient is retuning to PT after hospitalization on 11/14/21 where she then went to SNF and then received home health therapy which stopped 02/08/22. Patient has weakness in BLE with RLE>LLE. She drives with hand controls. Patient has been diagnosed with MS in 1995.  PMH includes: back pain, CBP, chronic L shoulder pain, galactorrhea, neuropathy, HPV, hypercholestermeia, HTN, MS, osteopenia, PONV, wrist fracture. Started back driving about two weeks ago.   PAIN:  Are you having pain? Yes: NPRS scale: 2/10 Pain location: abdomen to R leg Pain description: spasm Aggravating factors: unknown Relieving factors: n/a  PRECAUTIONS: Fall  WEIGHT BEARING RESTRICTIONS No  FALLS: Has patient fallen in last 6 months? No  LIVING ENVIRONMENT: Lives with: lives with their family Lives in: House/apartment Stairs: Yes: Internal: full flight steps; but lives fully on first floor Has following equipment at home: Gilford Rile - 2 wheeled, Wheelchair (manual), shower chair, Grab bars, and Ramped entry  PLOF: Independent with household mobility with device  PATIENT GOALS be able to get into the bed  without assistance, improve ambulation, standing tolerance, going up steps.   OBJECTIVE:   DIAGNOSTIC FINDINGS: MRI of the brain 03/18/2020 showed multiple T2/FLAIR hyperintense foci in the periventricular, juxtacortical and deep white matter.  There were no infratentorial lesions noted.  None of the foci enhanced.   Due to severe claustrophobia, the study was done with conscious sedation in the hospital  MRI of the cervical spine 03/18/2020 showed T2 hyperintense foci at C1-C2, C2-C3, C4-C5, C5-C6 and T1.  There is a disc protrusion at C5-C6 to the left and a larger disc protrusion at C6-C7 to the right that distorts the thecal sac.  The adjacent spinal cord has normal signal.  There could be compression of the right C7 nerve root.   LOWER EXTREMITY MMT:    MMT Right Eval Left Eval  Hip flexion 2- 2  Hip extension 2- 2  Hip abduction 2+ 2+  Hip adduction 2+ 2+  Knee flexion 2- 2  Knee extension 2 2  Ankle dorsiflexion trace trace  Ankle plantarflexion trace trace  (Blank rows = not tested)   TODAY'S TREATMENT:  Goals: see below for details   STS: attempted 2x; unable to perform with LE buckling.  10 MWT: unable to attempt due to LE buckling.   Treatment:* performed in seated due to weakness* RTB abduction 15x Green swiss ball: -TrA activation 15x -TrA activation with overhead raise 10x each arm  -overhead raise 10x  -twists 10x each side   Flexion isometric 10x each LE Hamstring isometric 10x each LE  Adduction isometric 10x each LE  Overhead Y 10x   PATIENT EDUCATION: Education details: goals, POC, outcome measures Person educated: Patient Education method: Explanation, Demonstration, Tactile cues, and Verbal cues Education comprehension: verbalized understanding, returned demonstration, verbal cues required, and tactile cues required   HOME EXERCISE PROGRAM: Give next session  GOALS: Goals reviewed with patient? Yes  SHORT TERM GOALS: Target date:  06/27/2022    Patient will be independent in home exercise program to improve strength/mobility for better functional independence with ADLs. Baseline:11/9: compliant 12/19: HEP compliant  Goal status: Partially Met  LONG TERM GOALS: Target date: 08/08/2022    Patient will increase FOTO score to equal to or greater than  52%   to demonstrate statistically significant improvement in mobility and quality of life Baseline: 10/10: 42% 11/9: 40% 12/19: 40% 1/9: 36% Goal status: in progress  2.  Patient (> 59 years old) will complete five times sit to stand test in < 15 seconds indicating an increased LE strength and improved balance. Baseline: 10/10: 38 seconds with one plop heavy UE support 11/9: 26 seconds with heavy BUE support 12/19: 37 seconds with BUE support 1/9: unable to perform due to weakness s/p procedure Goal status: In progress  3.  Patient will increase 10 meter walk test to >0.5 m/s as to improve gait speed for better community ambulation and to reduce fall risk. Baseline: 10/10: 54 seconds with bRW andw/c follow 11/9: 43.3 seconds with RW and w/c follow 12/19: 40.9 seconds with RW and w/c follow 1/9: unable to perform due to weakness s/p procedure Goal status: In progress  4.  Patient will be able to get into bed independently with use of railing to increase independence with mobility and nighttime routine.  Baseline: 10/10: needs complete assistance to bring RLE into bed. 11/9: needs assistance with leg; 12/19: min a for RLE  Goal status: In progress  5.  Patient will go up 2 small steps to enter sisters house with min A.  Baseline: 10/10: unable to perform 11/9: unable to perform on 2 " step  12/18: unable to perform 1/9: unable to perform due to weakness s/p procedure Goal status: IN PROGRESS  6.  Patient will  be able to transfer from standard chair heights and booths for community mobility.  Baseline: 1/9: unable  Goal status:NEW  ASSESSMENT:  CLINICAL  IMPRESSION: Patient's goals severely impacted due to reaction s/p procedure reducing her ability to remain standing. Patient is returning from procedure previous week and has history of weakness/impairment status post anesthesia. Patient will continue to benefit from therapy and goals will be tested again when patient is able to maintain standing position. New goal for sit to stand positioning added to POC. Patient is aware of safety implications of her LE weakness and demonstrates safe functional mobility this session. Patient will continue to benefit from skilled physical therapy intervention to address impairments, improve QOL, and attain therapy goals.    OBJECTIVE IMPAIRMENTS Abnormal gait, decreased activity tolerance, decreased balance, decreased coordination, decreased endurance, decreased mobility, difficulty walking, decreased ROM, decreased strength, hypomobility, increased edema, impaired perceived functional ability, increased muscle spasms, impaired flexibility, impaired tone, impaired UE functional use, improper body mechanics, postural dysfunction, obesity, and pain.   ACTIVITY LIMITATIONS carrying, lifting, bending, standing, squatting, sleeping, stairs, transfers, bed mobility, bathing, toileting, dressing, reach over head, locomotion level, and caring for others  PARTICIPATION LIMITATIONS: meal prep, cleaning, laundry, shopping, community activity, and yard work  PERSONAL FACTORS Age, Fitness, Past/current experiences, Sex, Social background, Time since onset of injury/illness/exacerbation, Transportation, and 3+ comorbidities: back pain, CBP, chronic L shoulder pain, galactorrhea, neuropathy, HPV, hypercholestermeia, HTN, MS, osteopenia, PONV, wrist fracture  are also affecting patient's functional outcome.   REHAB POTENTIAL: Fair    CLINICAL DECISION MAKING: Evolving/moderate complexity  EVALUATION COMPLEXITY: Moderate  PLAN: PT FREQUENCY: 2x/week  PT DURATION: 12  weeks  PLANNED INTERVENTIONS: Therapeutic exercises, Therapeutic activity, Neuromuscular re-education, Balance training, Gait training, Patient/Family education, Self Care, Joint mobilization, Stair training, Vestibular training, Canalith repositioning, Visual/preceptual remediation/compensation, Orthotic/Fit training, DME instructions, Dry Needling, Electrical stimulation, Wheelchair mobility training, Spinal mobilization, Cryotherapy, Moist heat, Taping, Vasopneumatic device, Traction, Ultrasound, Ionotophoresis 4mg /ml Dexamethasone, Manual therapy, and Re-evaluation  PLAN FOR NEXT SESSION: standing balance and strengthening, work on ambulation of decline and standing balance with decline.      , PT 05/16/2022, 10:59 AM

## 2022-05-16 ENCOUNTER — Ambulatory Visit: Payer: PPO | Attending: Internal Medicine

## 2022-05-16 DIAGNOSIS — R262 Difficulty in walking, not elsewhere classified: Secondary | ICD-10-CM | POA: Insufficient documentation

## 2022-05-16 DIAGNOSIS — M6281 Muscle weakness (generalized): Secondary | ICD-10-CM | POA: Insufficient documentation

## 2022-05-16 DIAGNOSIS — R2681 Unsteadiness on feet: Secondary | ICD-10-CM | POA: Diagnosis not present

## 2022-05-17 NOTE — Therapy (Signed)
OUTPATIENT PHYSICAL THERAPY NEURO TREATMENT  Patient Name: Lisa Williams MRN: [id number] DOB:1961/01/01, 62 y.o., female Today's Date: 05/18/2022  PCP: Kirk Ruths  REFERRING PROVIDER: Britt Bottom MD   PT End of Session - 05/18/22 1000     Visit Number 25    Number of Visits 48    Date for PT Re-Evaluation 08/08/22    Authorization Type 5/10 PN 12/19    PT Start Time 1015    PT Stop Time 1059    PT Time Calculation (min) 44 min    Equipment Utilized During Treatment Gait belt    Activity Tolerance Patient tolerated treatment well;Patient limited by fatigue    Behavior During Therapy [facility] for tasks assessed/performed                  Past Medical History:  Diagnosis Date   Abdominal pain, right upper quadrant    Back pain    Calculus of kidney 12/09/2013   Chronic back pain    unspecified   Chronic left shoulder pain 40/98/1191   Complication of anesthesia    Functional disorder of bladder    other   Galactorrhea 11/26/2014   Chronic    Hereditary and idiopathic neuropathy 08/19/2013   History of kidney stones    HPV test positive    Hypercholesteremia 08/19/2013   Hypertension    Incomplete bladder emptying    Microscopic hematuria    MS (multiple sclerosis) (HCC)    Muscle spasticity 05/21/2014   Nonspecific findings on examination of urine    other   Osteopenia    PONV (postoperative nausea and vomiting)    Status post laparoscopic supracervical hysterectomy 11/26/2014   Tobacco user 11/26/2014   Wrist fracture    Past Surgical History:  Procedure Laterality Date   bilateral tubal ligation  1996   BREAST CYST EXCISION Left 2002   CYST EXCISION Left 05/10/2022   Procedure: CYST REMOVAL;  Surgeon: Herbert Pun, MD;  Location: [facility];  Service: General;  Laterality: Left;   FRACTURE SURGERY     KNEE SURGERY     right   LAPAROSCOPIC SUPRACERVICAL HYSTERECTOMY  08/05/2013   ORIF WRIST FRACTURE Left 01/17/2017    Procedure: OPEN REDUCTION INTERNAL FIXATION (ORIF) WRIST FRACTURE;  Surgeon: Lovell Sheehan, MD;  Location: [facility];  Service: Orthopedics;  Laterality: Left;   RADIOLOGY WITH ANESTHESIA N/A 03/18/2020   Procedure: MRI WITH ANESTHESIA CERVICAL SPINE AND BRAIN  WITH AND WITHOUT CONTRAST;  Surgeon: Radiologist, Medication, MD;  Location: [place];  Service: Radiology;  Laterality: N/A;   TUBAL LIGATION Bilateral    VAGINAL HYSTERECTOMY  03/2006   Patient Active Problem List   Diagnosis Date Noted   Abscess of left groin 11/15/2021   Abnormal LFTs 11/15/2021   Acute respiratory disease due to COVID-19 virus 11/21/2020   Weakness    Hypoalbuminemia due to protein-calorie malnutrition (HCC)    Neurogenic bowel    Neurogenic bladder    Labile blood pressure    Neuropathic pain    Abscess of female pelvis    SVT (supraventricular tachycardia)    Radial styloid tenosynovitis 03/12/2018   Wheelchair confinement 02/27/2018   Localized osteoporosis with current pathological fracture with routine healing 01/19/2017   Wrist fracture 01/16/2017   Sprain of ankle 03/23/2016   Closed fracture of lateral malleolus 03/16/2016   Health care maintenance 01/24/2016   Blood pressure elevated without history of HTN 10/25/2015   Essential hypertension 10/25/2015   Multiple  sclerosis (HCC) 10/02/2015   Chronic left shoulder pain 07/19/2015   Multiple sclerosis exacerbation (HCC) 07/14/2015   MS (multiple sclerosis) (HCC) 11/26/2014   Increased body mass index 11/26/2014   HPV test positive 11/26/2014   Status post laparoscopic supracervical hysterectomy 11/26/2014   Galactorrhea 11/26/2014   Back ache 05/21/2014   Adiposity 05/21/2014   Disordered sleep 05/21/2014   Muscle spasticity 05/21/2014   Spasticity 05/21/2014   Calculus of kidney 12/09/2013   Renal colic 12/09/2013   Hypercholesteremia 08/19/2013   Hereditary and idiopathic neuropathy 08/19/2013   Hypercholesterolemia without  hypertriglyceridemia 08/19/2013   Bladder infection, chronic 07/25/2012   Disorder of bladder function 07/25/2012   Incomplete bladder emptying 07/25/2012   Microscopic hematuria 07/25/2012   Right upper quadrant pain 07/25/2012   ONSET DATE: 1995  REFERRING DIAG: MS  THERAPY DIAG:  Muscle weakness (generalized)  Difficulty in walking, not elsewhere classified  Unsteadiness on feet  Rationale for Evaluation and Treatment Rehabilitation  SUBJECTIVE:                                                                                                                                                                                          SUBJECTIVE STATEMENT: Patient reports she is feeling a little better than last session. Was able to get up better today.  .   Pt accompanied by: self  PERTINENT HISTORY: Patient is retuning to PT after hospitalization on 11/14/21 where she then went to SNF and then received home health therapy which stopped 02/08/22. Patient has weakness in BLE with RLE>LLE. She drives with hand controls. Patient has been diagnosed with MS in 1995.  PMH includes: back pain, CBP, chronic L shoulder pain, galactorrhea, neuropathy, HPV, hypercholestermeia, HTN, MS, osteopenia, PONV, wrist fracture. Started back driving about two weeks ago.   PAIN:  Are you having pain? Yes: NPRS scale: 2/10 Pain location: abdomen to R leg Pain description: spasm Aggravating factors: unknown Relieving factors: n/a  PRECAUTIONS: Fall  WEIGHT BEARING RESTRICTIONS No  FALLS: Has patient fallen in last 6 months? No  LIVING ENVIRONMENT: Lives with: lives with their family Lives in: House/apartment Stairs: Yes: Internal: full flight steps; but lives fully on first floor Has following equipment at home: Dan Humphreys - 2 wheeled, Wheelchair (manual), shower chair, Grab bars, and Ramped entry  PLOF: Independent with household mobility with device  PATIENT GOALS be able to get into the bed  without assistance, improve ambulation, standing tolerance, going up steps.   OBJECTIVE:   DIAGNOSTIC FINDINGS: MRI of the brain 03/18/2020 showed multiple T2/FLAIR hyperintense foci in the periventricular, juxtacortical and deep white matter.  There were no infratentorial  lesions noted.  None of the foci enhanced.   Due to severe claustrophobia, the study was done with conscious sedation in the hospital  MRI of the cervical spine 03/18/2020 showed T2 hyperintense foci at C1-C2, C2-C3, C4-C5, C5-C6 and T1.  There is a disc protrusion at C5-C6 to the left and a larger disc protrusion at C6-C7 to the right that distorts the thecal sac.  The adjacent spinal cord has normal signal.  There could be compression of the right C7 nerve root.   LOWER EXTREMITY MMT:    MMT Right Eval Left Eval  Hip flexion 2- 2  Hip extension 2- 2  Hip abduction 2+ 2+  Hip adduction 2+ 2+  Knee flexion 2- 2  Knee extension 2 2  Ankle dorsiflexion trace trace  Ankle plantarflexion trace trace  (Blank rows = not tested)   TODAY'S TREATMENT:   Treatment:  5x STS : 29.2  10 MWT: 47 seconds  5lb bar: -chest press 15x -overhead raise 15x -witches brew 10x each side -single arm row alternating 10x each side -bicep curl 15x   Seated on dynadisc:  -rainbow ball throw x10 -rainbow ball arc 10x -forward/backwards lean 10x  GTB abduction 15x  PATIENT EDUCATION: Education details: goals, POC, outcome measures Person educated: Patient Education method: Explanation, Demonstration, Tactile cues, and Verbal cues Education comprehension: verbalized understanding, returned demonstration, verbal cues required, and tactile cues required   HOME EXERCISE PROGRAM: Give next session  GOALS: Goals reviewed with patient? Yes  SHORT TERM GOALS: Target date: 06/27/2022    Patient will be independent in home exercise program to improve strength/mobility for better functional independence with  ADLs. Baseline:11/9: compliant 12/19: HEP compliant  Goal status: Partially Met  LONG TERM GOALS: Target date: 08/08/2022    Patient will increase FOTO score to equal to or greater than  52%   to demonstrate statistically significant improvement in mobility and quality of life Baseline: 10/10: 42% 11/9: 40% 12/19: 40% 1/9: 36% Goal status: in progress  2.  Patient (> 53 years old) will complete five times sit to stand test in < 15 seconds indicating an increased LE strength and improved balance. Baseline: 10/10: 38 seconds with one plop heavy UE support 11/9: 26 seconds with heavy BUE support 12/19: 37 seconds with BUE support 1/9: unable to perform due to weakness s/p procedure 1/11: 29.2 seconds  Goal status: In progress  3.  Patient will increase 10 meter walk test to >0.5 m/s as to improve gait speed for better community ambulation and to reduce fall risk. Baseline: 10/10: 54 seconds with bRW andw/c follow 11/9: 43.3 seconds with RW and w/c follow 12/19: 40.9 seconds with RW and w/c follow 1/9: unable to perform due to weakness s/p procedure 1/11: 47 seconds with RW and w/c follow Goal status: In progress  4.  Patient will be able to get into bed independently with use of railing to increase independence with mobility and nighttime routine.  Baseline: 10/10: needs complete assistance to bring RLE into bed. 11/9: needs assistance with leg; 12/19: min a for RLE  Goal status: In progress  5.  Patient will go up 2 small steps to enter sisters house with min A.  Baseline: 10/10: unable to perform 11/9: unable to perform on 2 " step  12/18: unable to perform 1/9: unable to perform due to weakness s/p procedure Goal status: IN PROGRESS  6.  Patient will be able to transfer from standard chair heights and booths for community  mobility.  Baseline: 1/9: unable  Goal status:NEW  ASSESSMENT:  CLINICAL IMPRESSION: Patient's 5x STS improved. Her ambulation continues to be limited by weakness  s/p procedure. Patient is aware of safety implications of her LE weakness and demonstrates safe functional mobility this session. She is highly motivated throughout session and continues to work her hardest for each exercise. Patient will continue to benefit from skilled physical therapy intervention to address impairments, improve QOL, and attain therapy goals.    OBJECTIVE IMPAIRMENTS Abnormal gait, decreased activity tolerance, decreased balance, decreased coordination, decreased endurance, decreased mobility, difficulty walking, decreased ROM, decreased strength, hypomobility, increased edema, impaired perceived functional ability, increased muscle spasms, impaired flexibility, impaired tone, impaired UE functional use, improper body mechanics, postural dysfunction, obesity, and pain.   ACTIVITY LIMITATIONS carrying, lifting, bending, standing, squatting, sleeping, stairs, transfers, bed mobility, bathing, toileting, dressing, reach over head, locomotion level, and caring for others  PARTICIPATION LIMITATIONS: meal prep, cleaning, laundry, shopping, community activity, and yard work  PERSONAL FACTORS Age, Fitness, Past/current experiences, Sex, Social background, Time since onset of injury/illness/exacerbation, Transportation, and 3+ comorbidities: back pain, CBP, chronic L shoulder pain, galactorrhea, neuropathy, HPV, hypercholestermeia, HTN, MS, osteopenia, PONV, wrist fracture  are also affecting patient's functional outcome.   REHAB POTENTIAL: Fair    CLINICAL DECISION MAKING: Evolving/moderate complexity  EVALUATION COMPLEXITY: Moderate  PLAN: PT FREQUENCY: 2x/week  PT DURATION: 12 weeks  PLANNED INTERVENTIONS: Therapeutic exercises, Therapeutic activity, Neuromuscular re-education, Balance training, Gait training, Patient/Family education, Self Care, Joint mobilization, Stair training, Vestibular training, Canalith repositioning, Visual/preceptual remediation/compensation, Orthotic/Fit  training, DME instructions, Dry Needling, Electrical stimulation, Wheelchair mobility training, Spinal mobilization, Cryotherapy, Moist heat, Taping, Vasopneumatic device, Traction, Ultrasound, Ionotophoresis 4mg /ml Dexamethasone, Manual therapy, and Re-evaluation  PLAN FOR NEXT SESSION: standing balance and strengthening, work on ambulation of decline and standing balance with decline.      Janna Arch, PT 05/18/2022, 11:03 AM

## 2022-05-18 ENCOUNTER — Ambulatory Visit: Payer: PPO | Attending: Neurology

## 2022-05-18 DIAGNOSIS — R278 Other lack of coordination: Secondary | ICD-10-CM | POA: Diagnosis not present

## 2022-05-18 DIAGNOSIS — M6281 Muscle weakness (generalized): Secondary | ICD-10-CM

## 2022-05-18 DIAGNOSIS — R269 Unspecified abnormalities of gait and mobility: Secondary | ICD-10-CM | POA: Diagnosis not present

## 2022-05-18 DIAGNOSIS — R262 Difficulty in walking, not elsewhere classified: Secondary | ICD-10-CM | POA: Diagnosis not present

## 2022-05-18 DIAGNOSIS — R2681 Unsteadiness on feet: Secondary | ICD-10-CM | POA: Insufficient documentation

## 2022-05-18 NOTE — Anesthesia Postprocedure Evaluation (Signed)
Anesthesia Post Note  Patient: Lisa Williams  Procedure(s) Performed: CYST REMOVAL (Left: Groin)  Anesthesia Type: General Anesthetic complications: no   No notable events documented.   Last Vitals:  Vitals:   05/10/22 1315 05/10/22 1340  BP: (!) 140/81 (!) 147/85  Pulse: 61 74  Resp: 18 16  Temp:  (!) 36.3 C  SpO2: 98% 100%    Last Pain:  Vitals:   05/10/22 1340  TempSrc: Oral  PainSc: 0-No pain                 Martha Clan

## 2022-05-22 NOTE — Therapy (Signed)
OUTPATIENT PHYSICAL THERAPY NEURO TREATMENT  Patient Name: Lisa Williams Lisa MRN: [id number] DOB:12-11-1960, 62 y.o., female Today's Date: 05/23/2022  PCP: Kirk Ruths  REFERRING PROVIDER: Britt Bottom MD   PT End of Session - 05/23/22 1014     Visit Number 26    Number of Visits 48    Date for PT Re-Evaluation 08/08/22    Authorization Type 6/10 PN 12/19    PT Start Time 1015    PT Stop Time 1059    PT Time Calculation (min) 44 min    Equipment Utilized During Treatment Gait belt    Activity Tolerance Patient tolerated treatment well;Patient limited by fatigue    Behavior During Therapy [facility] for tasks assessed/performed                   Past Medical History:  Diagnosis Date   Abdominal pain, right upper quadrant    Back pain    Calculus of kidney 12/09/2013   Chronic back pain    unspecified   Chronic left shoulder pain 32/44/0102   Complication of anesthesia    Functional disorder of bladder    other   Galactorrhea 11/26/2014   Chronic    Hereditary and idiopathic neuropathy 08/19/2013   History of kidney stones    HPV test positive    Hypercholesteremia 08/19/2013   Hypertension    Incomplete bladder emptying    Microscopic hematuria    MS (multiple sclerosis) (HCC)    Muscle spasticity 05/21/2014   Nonspecific findings on examination of urine    other   Osteopenia    PONV (postoperative nausea and vomiting)    Status post laparoscopic supracervical hysterectomy 11/26/2014   Tobacco user 11/26/2014   Wrist fracture    Past Surgical History:  Procedure Laterality Date   bilateral tubal ligation  1996   BREAST CYST EXCISION Left 2002   CYST EXCISION Left 05/10/2022   Procedure: CYST REMOVAL;  Surgeon: Herbert Pun, MD;  Location: [facility];  Service: General;  Laterality: Left;   FRACTURE SURGERY     KNEE SURGERY     right   LAPAROSCOPIC SUPRACERVICAL HYSTERECTOMY  08/05/2013   ORIF WRIST FRACTURE Left 01/17/2017    Procedure: OPEN REDUCTION INTERNAL FIXATION (ORIF) WRIST FRACTURE;  Surgeon: Lovell Sheehan, MD;  Location: [facility];  Service: Orthopedics;  Laterality: Left;   RADIOLOGY WITH ANESTHESIA N/A 03/18/2020   Procedure: MRI WITH ANESTHESIA CERVICAL SPINE AND BRAIN  WITH AND WITHOUT CONTRAST;  Surgeon: Radiologist, Medication, MD;  Location: [place];  Service: Radiology;  Laterality: N/A;   TUBAL LIGATION Bilateral    VAGINAL HYSTERECTOMY  03/2006   Patient Active Problem List   Diagnosis Date Noted   Abscess of left groin 11/15/2021   Abnormal LFTs 11/15/2021   Acute respiratory disease due to COVID-19 virus 11/21/2020   Weakness    Hypoalbuminemia due to protein-calorie malnutrition [facility])    Neurogenic bowel    Neurogenic bladder    Labile blood pressure    Neuropathic pain    Abscess of female pelvis    SVT (supraventricular tachycardia)    Radial styloid tenosynovitis 03/12/2018   Wheelchair confinement 02/27/2018   Localized osteoporosis with current pathological fracture with routine healing 01/19/2017   Wrist fracture 01/16/2017   Sprain of ankle 03/23/2016   Closed fracture of lateral malleolus 03/16/2016   Health care maintenance 01/24/2016   Blood pressure elevated without history of HTN 10/25/2015   Essential hypertension 10/25/2015  Multiple sclerosis (HCC) 10/02/2015   Chronic left shoulder pain 07/19/2015   Multiple sclerosis exacerbation (HCC) 07/14/2015   MS (multiple sclerosis) (HCC) 11/26/2014   Increased body mass index 11/26/2014   HPV test positive 11/26/2014   Status post laparoscopic supracervical hysterectomy 11/26/2014   Galactorrhea 11/26/2014   Back ache 05/21/2014   Adiposity 05/21/2014   Disordered sleep 05/21/2014   Muscle spasticity 05/21/2014   Spasticity 05/21/2014   Calculus of kidney 12/09/2013   Renal colic 12/09/2013   Hypercholesteremia 08/19/2013   Hereditary and idiopathic neuropathy 08/19/2013   Hypercholesterolemia without  hypertriglyceridemia 08/19/2013   Bladder infection, chronic 07/25/2012   Disorder of bladder function 07/25/2012   Incomplete bladder emptying 07/25/2012   Microscopic hematuria 07/25/2012   Right upper quadrant pain 07/25/2012   ONSET DATE: 1995  REFERRING DIAG: MS  THERAPY DIAG:  Muscle weakness (generalized)  Difficulty in walking, not elsewhere classified  Unsteadiness on feet  Rationale for Evaluation and Treatment Rehabilitation  SUBJECTIVE:                                                                                                                                                                                          SUBJECTIVE STATEMENT: Patient reports she is feeling good today, just cold.  .   Pt accompanied by: self  PERTINENT HISTORY: Patient is retuning to PT after hospitalization on 11/14/21 where she then went to SNF and then received home health therapy which stopped 02/08/22. Patient has weakness in BLE with RLE>LLE. She drives with hand controls. Patient has been diagnosed with MS in 1995.  PMH includes: back pain, CBP, chronic L shoulder pain, galactorrhea, neuropathy, HPV, hypercholestermeia, HTN, MS, osteopenia, PONV, wrist fracture. Started back driving about two weeks ago.   PAIN:  Are you having pain? Yes: NPRS scale: 2/10 Pain location: abdomen to R leg Pain description: spasm Aggravating factors: unknown Relieving factors: n/a  PRECAUTIONS: Fall  WEIGHT BEARING RESTRICTIONS No  FALLS: Has patient fallen in last 6 months? No  LIVING ENVIRONMENT: Lives with: lives with their family Lives in: House/apartment Stairs: Yes: Internal: full flight steps; but lives fully on first floor Has following equipment at home: Dan Humphreys - 2 wheeled, Wheelchair (manual), shower chair, Grab bars, and Ramped entry  PLOF: Independent with household mobility with device  PATIENT GOALS be able to get into the bed without assistance, improve ambulation, standing  tolerance, going up steps.   OBJECTIVE:   DIAGNOSTIC FINDINGS: MRI of the brain 03/18/2020 showed multiple T2/FLAIR hyperintense foci in the periventricular, juxtacortical and deep white matter.  There were no infratentorial lesions noted.  None of the foci enhanced.  Due to severe claustrophobia, the study was done with conscious sedation in the hospital  MRI of the cervical spine 03/18/2020 showed T2 hyperintense foci at C1-C2, C2-C3, C4-C5, C5-C6 and T1.  There is a disc protrusion at C5-C6 to the left and a larger disc protrusion at C6-C7 to the right that distorts the thecal sac.  The adjacent spinal cord has normal signal.  There could be compression of the right C7 nerve root.   LOWER EXTREMITY MMT:    MMT Right Eval Left Eval  Hip flexion 2- 2  Hip extension 2- 2  Hip abduction 2+ 2+  Hip adduction 2+ 2+  Knee flexion 2- 2  Knee extension 2 2  Ankle dorsiflexion trace trace  Ankle plantarflexion trace trace  (Blank rows = not tested)   TODAY'S TREATMENT:   Treatment:  Patient ambulated 67 ft, then 18 ft after a seated rest break with bRW and wheelchair follow. Cues for weight shift and foot clearance required with decreased clearance as patient fatigued.   Seated: 5x STS: performed two, then rest, two then rest, then one.  15lb KB: -bicep curl 5x -static chest press hold 30 seconds -upright row 5x Black TB -adduction 10x each LE -abduction 10x each LE -overhead Y 10x       PATIENT EDUCATION: Education details: goals, POC, outcome measures Person educated: Patient Education method: Explanation, Demonstration, Tactile cues, and Verbal cues Education comprehension: verbalized understanding, returned demonstration, verbal cues required, and tactile cues required   HOME EXERCISE PROGRAM: Give next session  GOALS: Goals reviewed with patient? Yes  SHORT TERM GOALS: Target date: 06/27/2022    Patient will be independent in home exercise program to  improve strength/mobility for better functional independence with ADLs. Baseline:11/9: compliant 12/19: HEP compliant  Goal status: Partially Met  LONG TERM GOALS: Target date: 08/08/2022    Patient will increase FOTO score to equal to or greater than  52%   to demonstrate statistically significant improvement in mobility and quality of life Baseline: 10/10: 42% 11/9: 40% 12/19: 40% 1/9: 36% Goal status: in progress  2.  Patient (> 21 years old) will complete five times sit to stand test in < 15 seconds indicating an increased LE strength and improved balance. Baseline: 10/10: 38 seconds with one plop heavy UE support 11/9: 26 seconds with heavy BUE support 12/19: 37 seconds with BUE support 1/9: unable to perform due to weakness s/p procedure 1/11: 29.2 seconds  Goal status: In progress  3.  Patient will increase 10 meter walk test to >0.5 m/s as to improve gait speed for better community ambulation and to reduce fall risk. Baseline: 10/10: 54 seconds with bRW andw/c follow 11/9: 43.3 seconds with RW and w/c follow 12/19: 40.9 seconds with RW and w/c follow 1/9: unable to perform due to weakness s/p procedure 1/11: 47 seconds with RW and w/c follow Goal status: In progress  4.  Patient will be able to get into bed independently with use of railing to increase independence with mobility and nighttime routine.  Baseline: 10/10: needs complete assistance to bring RLE into bed. 11/9: needs assistance with leg; 12/19: min a for RLE  Goal status: In progress  5.  Patient will go up 2 small steps to enter sisters house with min A.  Baseline: 10/10: unable to perform 11/9: unable to perform on 2 " step  12/18: unable to perform 1/9: unable to perform due to weakness s/p procedure Goal status: IN PROGRESS  6.  Patient will be able to transfer from standard chair heights and booths for community mobility.  Baseline: 1/9: unable  Goal status:NEW  ASSESSMENT:  CLINICAL IMPRESSION: Patient  tolerates prolonged ambulation this session for first time since her mini procedure. She is highly motivated throughout session but is challenged with eccentric control of sit to stand with frequent "plopping" when fatigued indicating area of continued focus. Patient will continue to benefit from skilled physical therapy intervention to address impairments, improve QOL, and attain therapy goals.    OBJECTIVE IMPAIRMENTS Abnormal gait, decreased activity tolerance, decreased balance, decreased coordination, decreased endurance, decreased mobility, difficulty walking, decreased ROM, decreased strength, hypomobility, increased edema, impaired perceived functional ability, increased muscle spasms, impaired flexibility, impaired tone, impaired UE functional use, improper body mechanics, postural dysfunction, obesity, and pain.   ACTIVITY LIMITATIONS carrying, lifting, bending, standing, squatting, sleeping, stairs, transfers, bed mobility, bathing, toileting, dressing, reach over head, locomotion level, and caring for others  PARTICIPATION LIMITATIONS: meal prep, cleaning, laundry, shopping, community activity, and yard work  PERSONAL FACTORS Age, Fitness, Past/current experiences, Sex, Social background, Time since onset of injury/illness/exacerbation, Transportation, and 3+ comorbidities: back pain, CBP, chronic L shoulder pain, galactorrhea, neuropathy, HPV, hypercholestermeia, HTN, MS, osteopenia, PONV, wrist fracture  are also affecting patient's functional outcome.   REHAB POTENTIAL: Fair    CLINICAL DECISION MAKING: Evolving/moderate complexity  EVALUATION COMPLEXITY: Moderate  PLAN: PT FREQUENCY: 2x/week  PT DURATION: 12 weeks  PLANNED INTERVENTIONS: Therapeutic exercises, Therapeutic activity, Neuromuscular re-education, Balance training, Gait training, Patient/Family education, Self Care, Joint mobilization, Stair training, Vestibular training, Canalith repositioning, Visual/preceptual  remediation/compensation, Orthotic/Fit training, DME instructions, Dry Needling, Electrical stimulation, Wheelchair mobility training, Spinal mobilization, Cryotherapy, Moist heat, Taping, Vasopneumatic device, Traction, Ultrasound, Ionotophoresis 4mg /ml Dexamethasone, Manual therapy, and Re-evaluation  PLAN FOR NEXT SESSION: standing balance and strengthening, work on ambulation of decline and standing balance with decline.      Janna Arch, PT 05/23/2022, 2:03 PM

## 2022-05-23 ENCOUNTER — Ambulatory Visit: Payer: PPO

## 2022-05-23 DIAGNOSIS — R2681 Unsteadiness on feet: Secondary | ICD-10-CM

## 2022-05-23 DIAGNOSIS — R262 Difficulty in walking, not elsewhere classified: Secondary | ICD-10-CM

## 2022-05-23 DIAGNOSIS — M6281 Muscle weakness (generalized): Secondary | ICD-10-CM | POA: Diagnosis not present

## 2022-05-24 NOTE — Therapy (Signed)
OUTPATIENT PHYSICAL THERAPY NEURO TREATMENT  Patient Name: Lisa Williams MRN: [id number] DOB:05-16-1960, 62 y.o., female Today's Date: 05/25/2022  PCP: Kirk Ruths  REFERRING PROVIDER: Britt Bottom MD   PT End of Session - 05/25/22 0956     Visit Number 27    Number of Visits 48    Date for PT Re-Evaluation 08/08/22    Authorization Type 6/10 PN 12/19    PT Start Time 1015    PT Stop Time 1059    PT Time Calculation (min) 44 min    Equipment Utilized During Treatment Gait belt    Activity Tolerance Patient tolerated treatment well;Patient limited by fatigue    Behavior During Therapy [facility] for tasks assessed/performed                    Past Medical History:  Diagnosis Date   Abdominal pain, right upper quadrant    Back pain    Calculus of kidney 12/09/2013   Chronic back pain    unspecified   Chronic left shoulder pain 00/[id number]/[id number]   Complication of anesthesia    Functional disorder of bladder    other   Galactorrhea 11/26/2014   Chronic    Hereditary and idiopathic neuropathy 08/19/2013   History of kidney stones    HPV test positive    Hypercholesteremia 08/19/2013   Hypertension    Incomplete bladder emptying    Microscopic hematuria    MS (multiple sclerosis) (HCC)    Muscle spasticity 05/21/2014   Nonspecific findings on examination of urine    other   Osteopenia    PONV (postoperative nausea and vomiting)    Status post laparoscopic supracervical hysterectomy 11/26/2014   Tobacco user 11/26/2014   Wrist fracture    Past Surgical History:  Procedure Laterality Date   bilateral tubal ligation  1996   BREAST CYST EXCISION Left 2002   CYST EXCISION Left 05/10/2022   Procedure: CYST REMOVAL;  Surgeon: Herbert Pun, MD;  Location: [facility];  Service: General;  Laterality: Left;   FRACTURE SURGERY     KNEE SURGERY     right   LAPAROSCOPIC SUPRACERVICAL HYSTERECTOMY  08/05/2013   ORIF WRIST FRACTURE Left 01/17/2017    Procedure: OPEN REDUCTION INTERNAL FIXATION (ORIF) WRIST FRACTURE;  Surgeon: Lovell Sheehan, MD;  Location: [facility];  Service: Orthopedics;  Laterality: Left;   RADIOLOGY WITH ANESTHESIA N/A 03/18/2020   Procedure: MRI WITH ANESTHESIA CERVICAL SPINE AND BRAIN  WITH AND WITHOUT CONTRAST;  Surgeon: Radiologist, Medication, MD;  Location: [place];  Service: Radiology;  Laterality: N/A;   TUBAL LIGATION Bilateral    VAGINAL HYSTERECTOMY  03/2006   Patient Active Problem List   Diagnosis Date Noted   Abscess of left groin 11/15/2021   Abnormal LFTs 11/15/2021   Acute respiratory disease due to COVID-19 virus 11/21/2020   Weakness    Hypoalbuminemia due to protein-calorie malnutrition [facility])    Neurogenic bowel    Neurogenic bladder    Labile blood pressure    Neuropathic pain    Abscess of female pelvis    SVT (supraventricular tachycardia)    Radial styloid tenosynovitis 03/12/2018   Wheelchair confinement 02/27/2018   Localized osteoporosis with current pathological fracture with routine healing 01/19/2017   Wrist fracture 01/16/2017   Sprain of ankle 03/23/2016   Closed fracture of lateral malleolus 03/16/2016   Health care maintenance 01/24/2016   Blood pressure elevated without history of HTN 10/25/2015   Essential hypertension 10/25/2015  Multiple sclerosis (HCC) 10/02/2015   Chronic left shoulder pain 07/19/2015   Multiple sclerosis exacerbation (HCC) 07/14/2015   MS (multiple sclerosis) (HCC) 11/26/2014   Increased body mass index 11/26/2014   HPV test positive 11/26/2014   Status post laparoscopic supracervical hysterectomy 11/26/2014   Galactorrhea 11/26/2014   Back ache 05/21/2014   Adiposity 05/21/2014   Disordered sleep 05/21/2014   Muscle spasticity 05/21/2014   Spasticity 05/21/2014   Calculus of kidney 12/09/2013   Renal colic 12/09/2013   Hypercholesteremia 08/19/2013   Hereditary and idiopathic neuropathy 08/19/2013   Hypercholesterolemia without  hypertriglyceridemia 08/19/2013   Bladder infection, chronic 07/25/2012   Disorder of bladder function 07/25/2012   Incomplete bladder emptying 07/25/2012   Microscopic hematuria 07/25/2012   Right upper quadrant pain 07/25/2012   ONSET DATE: 1995  REFERRING DIAG: MS  THERAPY DIAG:  Muscle weakness (generalized)  Difficulty in walking, not elsewhere classified  Unsteadiness on feet  Rationale for Evaluation and Treatment Rehabilitation  SUBJECTIVE:                                                                                                                                                                                          SUBJECTIVE STATEMENT: Patient reports she was tired after last session. Patient has an appointment this afternoon.  .   Pt accompanied by: self  PERTINENT HISTORY: Patient is retuning to PT after hospitalization on 11/14/21 where she then went to SNF and then received home health therapy which stopped 02/08/22. Patient has weakness in BLE with RLE>LLE. She drives with hand controls. Patient has been diagnosed with MS in 1995.  PMH includes: back pain, CBP, chronic L shoulder pain, galactorrhea, neuropathy, HPV, hypercholestermeia, HTN, MS, osteopenia, PONV, wrist fracture. Started back driving about two weeks ago.   PAIN:  Are you having pain? Yes: NPRS scale: 2/10 Pain location: abdomen to R leg Pain description: spasm Aggravating factors: unknown Relieving factors: n/a  PRECAUTIONS: Fall  WEIGHT BEARING RESTRICTIONS No  FALLS: Has patient fallen in last 6 months? No  LIVING ENVIRONMENT: Lives with: lives with their family Lives in: House/apartment Stairs: Yes: Internal: full flight steps; but lives fully on first floor Has following equipment at home: Dan Humphreys - 2 wheeled, Wheelchair (manual), shower chair, Grab bars, and Ramped entry  PLOF: Independent with household mobility with device  PATIENT GOALS be able to get into the bed without  assistance, improve ambulation, standing tolerance, going up steps.   OBJECTIVE:   DIAGNOSTIC FINDINGS: MRI of the brain 03/18/2020 showed multiple T2/FLAIR hyperintense foci in the periventricular, juxtacortical and deep white matter.  There were no infratentorial lesions noted.  None of the foci enhanced.   Due to severe claustrophobia, the study was done with conscious sedation in the hospital  MRI of the cervical spine 03/18/2020 showed T2 hyperintense foci at C1-C2, C2-C3, C4-C5, C5-C6 and T1.  There is a disc protrusion at C5-C6 to the left and a larger disc protrusion at C6-C7 to the right that distorts the thecal sac.  The adjacent spinal cord has normal signal.  There could be compression of the right C7 nerve root.   LOWER EXTREMITY MMT:    MMT Right Eval Left Eval  Hip flexion 2- 2  Hip extension 2- 2  Hip abduction 2+ 2+  Hip adduction 2+ 2+  Knee flexion 2- 2  Knee extension 2 2  Ankle dorsiflexion trace trace  Ankle plantarflexion trace trace  (Blank rows = not tested)   TODAY'S TREATMENT:   Treatment:  Ambulate up incline 30 ft; 20 ft with seated rest break with Brw with wheelchair follow. Cues for shift and lift leg Static stand on decline 38 seconds; 45 seconds with BrW and wheelchair behind for two trials   Seated: 3.5 lb bar: Row 10x Overhead underhand press 10x  Row boats 10x; paddleboard patterning (D2)  With BTB around bar: row 10x   5lb bar: Chest press 10x  [place] hand hold overhead press 10x Row/canoe (D2 ) pattern for paddleboard mimicry  BTB row 10x   Black TB adduction 10x ; x 2 sets  Black TB abduction 10x  x2 sets  PATIENT EDUCATION: Education details: goals, POC, outcome measures Person educated: Patient Education method: Explanation, Demonstration, Tactile cues, and Verbal cues Education comprehension: verbalized understanding, returned demonstration, verbal cues required, and tactile cues required   HOME EXERCISE PROGRAM: Give  next session  GOALS: Goals reviewed with patient? Yes  SHORT TERM GOALS: Target date: 06/27/2022    Patient will be independent in home exercise program to improve strength/mobility for better functional independence with ADLs. Baseline:11/9: compliant 12/19: HEP compliant  Goal status: Partially Met  LONG TERM GOALS: Target date: 08/08/2022    Patient will increase FOTO score to equal to or greater than  52%   to demonstrate statistically significant improvement in mobility and quality of life Baseline: 10/10: 42% 11/9: 40% 12/19: 40% 1/9: 36% Goal status: in progress  2.  Patient (> 5 years old) will complete five times sit to stand test in < 15 seconds indicating an increased LE strength and improved balance. Baseline: 10/10: 38 seconds with one plop heavy UE support 11/9: 26 seconds with heavy BUE support 12/19: 37 seconds with BUE support 1/9: unable to perform due to weakness s/p procedure 1/11: 29.2 seconds  Goal status: In progress  3.  Patient will increase 10 meter walk test to >0.5 m/s as to improve gait speed for better community ambulation and to reduce fall risk. Baseline: 10/10: 54 seconds with bRW andw/c follow 11/9: 43.3 seconds with RW and w/c follow 12/19: 40.9 seconds with RW and w/c follow 1/9: unable to perform due to weakness s/p procedure 1/11: 47 seconds with RW and w/c follow Goal status: In progress  4.  Patient will be able to get into bed independently with use of railing to increase independence with mobility and nighttime routine.  Baseline: 10/10: needs complete assistance to bring RLE into bed. 11/9: needs assistance with leg; 12/19: min a for RLE  Goal status: In progress  5.  Patient will go up 2 small steps to enter sisters house with min  A.  Baseline: 10/10: unable to perform 11/9: unable to perform on 2 " step  12/18: unable to perform 1/9: unable to perform due to weakness s/p procedure Goal status: IN PROGRESS  6.  Patient will be able to  transfer from standard chair heights and booths for community mobility.  Baseline: 1/9: unable  Goal status:NEW  ASSESSMENT:  CLINICAL IMPRESSION: Patient is able to perform incline walk and decline stand for first time since procedure. Patient is highly motivated throughout session.  Patient is able to maintain longer stand lengths on decline this session prior to needing to sit down. UE strengthening tolerated well. Patient will continue to benefit from skilled physical therapy intervention to address impairments, improve QOL, and attain therapy goals.    OBJECTIVE IMPAIRMENTS Abnormal gait, decreased activity tolerance, decreased balance, decreased coordination, decreased endurance, decreased mobility, difficulty walking, decreased ROM, decreased strength, hypomobility, increased edema, impaired perceived functional ability, increased muscle spasms, impaired flexibility, impaired tone, impaired UE functional use, improper body mechanics, postural dysfunction, obesity, and pain.   ACTIVITY LIMITATIONS carrying, lifting, bending, standing, squatting, sleeping, stairs, transfers, bed mobility, bathing, toileting, dressing, reach over head, locomotion level, and caring for others  PARTICIPATION LIMITATIONS: meal prep, cleaning, laundry, shopping, community activity, and yard work  PERSONAL FACTORS Age, Fitness, Past/current experiences, Sex, Social background, Time since onset of injury/illness/exacerbation, Transportation, and 3+ comorbidities: back pain, CBP, chronic L shoulder pain, galactorrhea, neuropathy, HPV, hypercholestermeia, HTN, MS, osteopenia, PONV, wrist fracture  are also affecting patient's functional outcome.   REHAB POTENTIAL: Fair    CLINICAL DECISION MAKING: Evolving/moderate complexity  EVALUATION COMPLEXITY: Moderate  PLAN: PT FREQUENCY: 2x/week  PT DURATION: 12 weeks  PLANNED INTERVENTIONS: Therapeutic exercises, Therapeutic activity, Neuromuscular re-education,  Balance training, Gait training, Patient/Family education, Self Care, Joint mobilization, Stair training, Vestibular training, Canalith repositioning, Visual/preceptual remediation/compensation, Orthotic/Fit training, DME instructions, Dry Needling, Electrical stimulation, Wheelchair mobility training, Spinal mobilization, Cryotherapy, Moist heat, Taping, Vasopneumatic device, Traction, Ultrasound, Ionotophoresis 4mg /ml Dexamethasone, Manual therapy, and Re-evaluation  PLAN FOR NEXT SESSION: standing balance and strengthening, work on ambulation of decline and standing balance with decline.      Janna Arch, PT 05/25/2022, 10:59 AM

## 2022-05-25 ENCOUNTER — Ambulatory Visit: Payer: PPO

## 2022-05-25 DIAGNOSIS — R262 Difficulty in walking, not elsewhere classified: Secondary | ICD-10-CM

## 2022-05-25 DIAGNOSIS — R2681 Unsteadiness on feet: Secondary | ICD-10-CM

## 2022-05-25 DIAGNOSIS — M6281 Muscle weakness (generalized): Secondary | ICD-10-CM | POA: Diagnosis not present

## 2022-05-29 NOTE — Therapy (Signed)
OUTPATIENT PHYSICAL THERAPY NEURO TREATMENT  Patient Name: Lisa Williams MRN: [id number] DOB:07/05/1960, 62 y.o., female Today's Date: 05/30/2022  PCP: Lauro Regulus  REFERRING PROVIDER: Asa Lente MD   PT End of Session - 05/30/22 1000     Visit Number 28    Number of Visits 48    Date for PT Re-Evaluation 08/08/22    Authorization Type 8/10 PN 12/19    PT Start Time 1015    PT Stop Time 1059    PT Time Calculation (min) 44 min    Equipment Utilized During Treatment Gait belt    Activity Tolerance Patient tolerated treatment well;Patient limited by fatigue    Behavior During Therapy [facility] for tasks assessed/performed                     Past Medical History:  Diagnosis Date   Abdominal pain, right upper quadrant    Back pain    Calculus of kidney 12/09/2013   Chronic back pain    unspecified   Chronic left shoulder pain 07/19/2015   Complication of anesthesia    Functional disorder of bladder    other   Galactorrhea 11/26/2014   Chronic    Hereditary and idiopathic neuropathy 08/19/2013   History of kidney stones    HPV test positive    Hypercholesteremia 08/19/2013   Hypertension    Incomplete bladder emptying    Microscopic hematuria    MS (multiple sclerosis) (HCC)    Muscle spasticity 05/21/2014   Nonspecific findings on examination of urine    other   Osteopenia    PONV (postoperative nausea and vomiting)    Status post laparoscopic supracervical hysterectomy 11/26/2014   Tobacco user 11/26/2014   Wrist fracture    Past Surgical History:  Procedure Laterality Date   bilateral tubal ligation  1996   BREAST CYST EXCISION Left 2002   CYST EXCISION Left 05/10/2022   Procedure: CYST REMOVAL;  Surgeon: Carolan Shiver, MD;  Location: [facility] ORS;  Service: General;  Laterality: Left;   FRACTURE SURGERY     KNEE SURGERY     right   LAPAROSCOPIC SUPRACERVICAL HYSTERECTOMY  08/05/2013   ORIF WRIST FRACTURE Left 01/17/2017    Procedure: OPEN REDUCTION INTERNAL FIXATION (ORIF) WRIST FRACTURE;  Surgeon: Lyndle Herrlich, MD;  Location: [facility] OR[facility];  Service: Orthopedics;  Laterality: Left;   RADIOLOGY WITH ANESTHESIA N/A 03/18/2020   Procedure: MRI WITH ANESTHESIA CERVICAL SPINE AND BRAIN  WITH AND WITHOUT CONTRAST;  Surgeon: Radiologist, Medication, MD;  Location: [facility] OR;  Service: Radiology;  Laterality: N/A;   TUBAL LIGATION Bilateral    VAGINAL HYSTERECTOMY  03/2006   Patient Active Problem List   Diagnosis Date Noted   Abscess of left groin 11/15/2021   Abnormal LFTs 11/15/2021   Acute respiratory disease due to COVID-19 virus 11/21/2020   Weakness    Hypoalbuminemia due to protein-calorie malnutrition [facility])    Neurogenic bowel    Neurogenic bladder    Labile blood pressure    Neuropathic pain    Abscess of female pelvis    SVT (supraventricular tachycardia)    Radial styloid tenosynovitis 03/12/2018   Wheelchair confinement 02/27/2018   Localized osteoporosis with current pathological fracture with routine healing 01/19/2017   Wrist fracture 01/16/2017   Sprain of ankle 03/23/2016   Closed fracture of lateral malleolus 03/16/2016   Health care maintenance 01/24/2016   Blood pressure elevated without history of HTN 10/25/2015   Essential hypertension 10/25/2015  Multiple sclerosis ([place]) 10/02/2015   Chronic left shoulder pain 07/19/2015   Multiple sclerosis exacerbation ([place]) 07/14/2015   MS (multiple sclerosis) ([place]) 11/26/2014   Increased body mass index 11/26/2014   HPV test positive 11/26/2014   Status post laparoscopic supracervical hysterectomy 11/26/2014   Galactorrhea 11/26/2014   Back ache 05/21/2014   Adiposity 05/21/2014   Disordered sleep 05/21/2014   Muscle spasticity 05/21/2014   Spasticity 05/21/2014   Calculus of kidney 99/83/3825   Renal colic 05/39/7673   Hypercholesteremia 08/19/2013   Hereditary and idiopathic neuropathy 08/19/2013   Hypercholesterolemia without  hypertriglyceridemia 08/19/2013   Bladder infection, chronic 07/25/2012   Disorder of bladder function 07/25/2012   Incomplete bladder emptying 07/25/2012   Microscopic hematuria 07/25/2012   Right upper quadrant pain 07/25/2012   ONSET DATE: 1995  REFERRING DIAG: MS  THERAPY DIAG:  Muscle weakness (generalized)  Difficulty in walking, not elsewhere classified  Unsteadiness on feet  Rationale for Evaluation and Treatment Rehabilitation  SUBJECTIVE:                                                                                                                                                                                          SUBJECTIVE STATEMENT: Patient reports intermittent pain in L wrist/hand.  .   Pt accompanied by: self  PERTINENT HISTORY: Patient is retuning to PT after hospitalization on 11/14/21 where she then went to SNF and then received home health therapy which stopped 02/08/22. Patient has weakness in BLE with RLE>LLE. She drives with hand controls. Patient has been diagnosed with MS in 1995.  PMH includes: back pain, CBP, chronic L shoulder pain, galactorrhea, neuropathy, HPV, hypercholestermeia, HTN, MS, osteopenia, PONV, wrist fracture. Started back driving about two weeks ago.   PAIN:  Are you having pain? Yes: NPRS scale: 2/10 Pain location: abdomen to R leg Pain description: spasm Aggravating factors: unknown Relieving factors: n/a  PRECAUTIONS: Fall  WEIGHT BEARING RESTRICTIONS No  FALLS: Has patient fallen in last 6 months? No  LIVING ENVIRONMENT: Lives with: lives with their family Lives in: House/apartment Stairs: Yes: Internal: full flight steps; but lives fully on first floor Has following equipment at home: Gilford Rile - 2 wheeled, Wheelchair (manual), shower chair, Grab bars, and Ramped entry  PLOF: Independent with household mobility with device  PATIENT GOALS be able to get into the bed without assistance, improve ambulation, standing  tolerance, going up steps.   OBJECTIVE:   DIAGNOSTIC FINDINGS: MRI of the brain 03/18/2020 showed multiple T2/FLAIR hyperintense foci in the periventricular, juxtacortical and deep white matter.  There were no infratentorial lesions noted.  None of the foci enhanced.  Due to severe claustrophobia, the study was done with conscious sedation in the hospital  MRI of the cervical spine 03/18/2020 showed T2 hyperintense foci at C1-C2, C2-C3, C4-C5, C5-C6 and T1.  There is a disc protrusion at C5-C6 to the left and a larger disc protrusion at C6-C7 to the right that distorts the thecal sac.  The adjacent spinal cord has normal signal.  There could be compression of the right C7 nerve root.   LOWER EXTREMITY MMT:    MMT Right Eval Left Eval  Hip flexion 2- 2  Hip extension 2- 2  Hip abduction 2+ 2+  Hip adduction 2+ 2+  Knee flexion 2- 2  Knee extension 2 2  Ankle dorsiflexion trace trace  Ankle plantarflexion trace trace  (Blank rows = not tested)   TODAY'S TREATMENT:   Treatment: Ambulate 91 ft with one seated rest break with bRW and wheelchair follow. Cues for shifting weight and lifting limbs for foot clearance tolerated well.   Seated: Seated boxing: cues for sequencing, body mechanics, and pace/rhythm for functional contraction and timing of muscle recruitment: -Cross body punches to mitts on PT hands with no back support for focused core stabilization with crossing midline 2x 60 seconds  -Cross body punch with secondary combination of elbow for full cross body rotation to PT mitt for trunk stability, coordination, and cardiovascular challenge x 60 seconds -Upper cut to mitts in PT hands for core activation without back support with perturbations 60 seconds  -windmill 10x each side -trunk extension 10x  PATIENT EDUCATION: Education details: goals, POC, outcome measures Person educated: Patient Education method: Explanation, Demonstration, Tactile cues, and Verbal  cues Education comprehension: verbalized understanding, returned demonstration, verbal cues required, and tactile cues required   HOME EXERCISE PROGRAM: Give next session  GOALS: Goals reviewed with patient? Yes  SHORT TERM GOALS: Target date: 06/27/2022    Patient will be independent in home exercise program to improve strength/mobility for better functional independence with ADLs. Baseline:11/9: compliant 12/19: HEP compliant  Goal status: Partially Met  LONG TERM GOALS: Target date: 08/08/2022    Patient will increase FOTO score to equal to or greater than  52%   to demonstrate statistically significant improvement in mobility and quality of life Baseline: 10/10: 42% 11/9: 40% 12/19: 40% 1/9: 36% Goal status: in progress  2.  Patient (> 45 years old) will complete five times sit to stand test in < 15 seconds indicating an increased LE strength and improved balance. Baseline: 10/10: 38 seconds with one plop heavy UE support 11/9: 26 seconds with heavy BUE support 12/19: 37 seconds with BUE support 1/9: unable to perform due to weakness s/p procedure 1/11: 29.2 seconds  Goal status: In progress  3.  Patient will increase 10 meter walk test to >0.5 m/s as to improve gait speed for better community ambulation and to reduce fall risk. Baseline: 10/10: 54 seconds with bRW andw/c follow 11/9: 43.3 seconds with RW and w/c follow 12/19: 40.9 seconds with RW and w/c follow 1/9: unable to perform due to weakness s/p procedure 1/11: 47 seconds with RW and w/c follow Goal status: In progress  4.  Patient will be able to get into bed independently with use of railing to increase independence with mobility and nighttime routine.  Baseline: 10/10: needs complete assistance to bring RLE into bed. 11/9: needs assistance with leg; 12/19: min a for RLE  Goal status: In progress  5.  Patient will go up 2 small steps to enter  sisters house with min A.  Baseline: 10/10: unable to perform 11/9: unable  to perform on 2 " step  12/18: unable to perform 1/9: unable to perform due to weakness s/p procedure Goal status: IN PROGRESS  6.  Patient will be able to transfer from standard chair heights and booths for community mobility.  Baseline: 1/9: unable  Goal status:NEW  ASSESSMENT:  CLINICAL IMPRESSION: Patient educated on need to see ortho if pain in post surgical wrist continues. Patient agreeable. Patient able to tolerate ambulation with one rest break. She is highly motivated throughout physical therapy session.  Patient will continue to benefit from skilled physical therapy intervention to address impairments, improve QOL, and attain therapy goals.    OBJECTIVE IMPAIRMENTS Abnormal gait, decreased activity tolerance, decreased balance, decreased coordination, decreased endurance, decreased mobility, difficulty walking, decreased ROM, decreased strength, hypomobility, increased edema, impaired perceived functional ability, increased muscle spasms, impaired flexibility, impaired tone, impaired UE functional use, improper body mechanics, postural dysfunction, obesity, and pain.   ACTIVITY LIMITATIONS carrying, lifting, bending, standing, squatting, sleeping, stairs, transfers, bed mobility, bathing, toileting, dressing, reach over head, locomotion level, and caring for others  PARTICIPATION LIMITATIONS: meal prep, cleaning, laundry, shopping, community activity, and yard work  PERSONAL FACTORS Age, Fitness, Past/current experiences, Sex, Social background, Time since onset of injury/illness/exacerbation, Transportation, and 3+ comorbidities: back pain, CBP, chronic L shoulder pain, galactorrhea, neuropathy, HPV, hypercholestermeia, HTN, MS, osteopenia, PONV, wrist fracture  are also affecting patient's functional outcome.   REHAB POTENTIAL: Fair    CLINICAL DECISION MAKING: Evolving/moderate complexity  EVALUATION COMPLEXITY: Moderate  PLAN: PT FREQUENCY: 2x/week  PT DURATION: 12  weeks  PLANNED INTERVENTIONS: Therapeutic exercises, Therapeutic activity, Neuromuscular re-education, Balance training, Gait training, Patient/Family education, Self Care, Joint mobilization, Stair training, Vestibular training, Canalith repositioning, Visual/preceptual remediation/compensation, Orthotic/Fit training, DME instructions, Dry Needling, Electrical stimulation, Wheelchair mobility training, Spinal mobilization, Cryotherapy, Moist heat, Taping, Vasopneumatic device, Traction, Ultrasound, Ionotophoresis 4mg /ml Dexamethasone, Manual therapy, and Re-evaluation  PLAN FOR NEXT SESSION: standing balance and strengthening, work on ambulation of decline and standing balance with decline.      Janna Arch, PT 05/30/2022, 12:24 PM

## 2022-05-30 ENCOUNTER — Ambulatory Visit: Payer: PPO

## 2022-05-30 DIAGNOSIS — M6281 Muscle weakness (generalized): Secondary | ICD-10-CM | POA: Diagnosis not present

## 2022-05-30 DIAGNOSIS — R262 Difficulty in walking, not elsewhere classified: Secondary | ICD-10-CM

## 2022-05-30 DIAGNOSIS — R2681 Unsteadiness on feet: Secondary | ICD-10-CM

## 2022-05-31 NOTE — Therapy (Signed)
OUTPATIENT PHYSICAL THERAPY NEURO TREATMENT  Patient Name: Lisa Williams MRN: [id number] DOB:08-29-1960, 62 y.o., female Today's Date: 06/01/2022  PCP: Kirk Ruths  REFERRING PROVIDER: Britt Bottom MD   PT End of Session - 06/01/22 1000     Visit Number 29    Number of Visits 48    Date for PT Re-Evaluation 08/08/22    Authorization Type 9/10 PN 12/19    PT Start Time 1015    PT Stop Time 1059    PT Time Calculation (min) 44 min    Equipment Utilized During Treatment Gait belt    Activity Tolerance Patient tolerated treatment well;Patient limited by fatigue    Behavior During Therapy [facility] for tasks assessed/performed                      Past Medical History:  Diagnosis Date   Abdominal pain, right upper quadrant    Back pain    Calculus of kidney 12/09/2013   Chronic back pain    unspecified   Chronic left shoulder pain 92/03/9416   Complication of anesthesia    Functional disorder of bladder    other   Galactorrhea 11/26/2014   Chronic    Hereditary and idiopathic neuropathy 08/19/2013   History of kidney stones    HPV test positive    Hypercholesteremia 08/19/2013   Hypertension    Incomplete bladder emptying    Microscopic hematuria    MS (multiple sclerosis) (HCC)    Muscle spasticity 05/21/2014   Nonspecific findings on examination of urine    other   Osteopenia    PONV (postoperative nausea and vomiting)    Status post laparoscopic supracervical hysterectomy 11/26/2014   Tobacco user 11/26/2014   Wrist fracture    Past Surgical History:  Procedure Laterality Date   bilateral tubal ligation  1996   BREAST CYST EXCISION Left 2002   CYST EXCISION Left 05/10/2022   Procedure: CYST REMOVAL;  Surgeon: Herbert Pun, MD;  Location: [facility];  Service: General;  Laterality: Left;   FRACTURE SURGERY     KNEE SURGERY     right   LAPAROSCOPIC SUPRACERVICAL HYSTERECTOMY  08/05/2013   ORIF WRIST FRACTURE Left 01/17/2017    Procedure: OPEN REDUCTION INTERNAL FIXATION (ORIF) WRIST FRACTURE;  Surgeon: Lovell Sheehan, MD;  Location: [facility];  Service: Orthopedics;  Laterality: Left;   RADIOLOGY WITH ANESTHESIA N/A 03/18/2020   Procedure: MRI WITH ANESTHESIA CERVICAL SPINE AND BRAIN  WITH AND WITHOUT CONTRAST;  Surgeon: Radiologist, Medication, MD;  Location: [place];  Service: Radiology;  Laterality: N/A;   TUBAL LIGATION Bilateral    VAGINAL HYSTERECTOMY  03/2006   Patient Active Problem List   Diagnosis Date Noted   Abscess of left groin 11/15/2021   Abnormal LFTs 11/15/2021   Acute respiratory disease due to COVID-19 virus 11/21/2020   Weakness    Hypoalbuminemia due to protein-calorie malnutrition (HCC)    Neurogenic bowel    Neurogenic bladder    Labile blood pressure    Neuropathic pain    Abscess of female pelvis    SVT (supraventricular tachycardia)    Radial styloid tenosynovitis 03/12/2018   Wheelchair confinement 02/27/2018   Localized osteoporosis with current pathological fracture with routine healing 01/19/2017   Wrist fracture 01/16/2017   Sprain of ankle 03/23/2016   Closed fracture of lateral malleolus 03/16/2016   Health care maintenance 01/24/2016   Blood pressure elevated without history of HTN 10/25/2015   Essential hypertension  10/25/2015   Multiple sclerosis (HCC) 10/02/2015   Chronic left shoulder pain 07/19/2015   Multiple sclerosis exacerbation (HCC) 07/14/2015   MS (multiple sclerosis) (HCC) 11/26/2014   Increased body mass index 11/26/2014   HPV test positive 11/26/2014   Status post laparoscopic supracervical hysterectomy 11/26/2014   Galactorrhea 11/26/2014   Back ache 05/21/2014   Adiposity 05/21/2014   Disordered sleep 05/21/2014   Muscle spasticity 05/21/2014   Spasticity 05/21/2014   Calculus of kidney 12/09/2013   Renal colic 12/09/2013   Hypercholesteremia 08/19/2013   Hereditary and idiopathic neuropathy 08/19/2013   Hypercholesterolemia without  hypertriglyceridemia 08/19/2013   Bladder infection, chronic 07/25/2012   Disorder of bladder function 07/25/2012   Incomplete bladder emptying 07/25/2012   Microscopic hematuria 07/25/2012   Right upper quadrant pain 07/25/2012   ONSET DATE: 1995  REFERRING DIAG: MS  THERAPY DIAG:  Muscle weakness (generalized)  Difficulty in walking, not elsewhere classified  Unsteadiness on feet  Rationale for Evaluation and Treatment Rehabilitation  SUBJECTIVE:                                                                                                                                                                                          SUBJECTIVE STATEMENT: Patient reports feeling tired. Has her mom's birthday this weekend.  .   Pt accompanied by: self  PERTINENT HISTORY: Patient is retuning to PT after hospitalization on 11/14/21 where she then went to SNF and then received home health therapy which stopped 02/08/22. Patient has weakness in BLE with RLE>LLE. She drives with hand controls. Patient has been diagnosed with MS in 1995.  PMH includes: back pain, CBP, chronic L shoulder pain, galactorrhea, neuropathy, HPV, hypercholestermeia, HTN, MS, osteopenia, PONV, wrist fracture. Started back driving about two weeks ago.   PAIN:  Are you having pain? Yes: NPRS scale: 2/10 Pain location: abdomen to R leg Pain description: spasm Aggravating factors: unknown Relieving factors: n/a  PRECAUTIONS: Fall  WEIGHT BEARING RESTRICTIONS No  FALLS: Has patient fallen in last 6 months? No  LIVING ENVIRONMENT: Lives with: lives with their family Lives in: House/apartment Stairs: Yes: Internal: full flight steps; but lives fully on first floor Has following equipment at home: Dan Humphreys - 2 wheeled, Wheelchair (manual), shower chair, Grab bars, and Ramped entry  PLOF: Independent with household mobility with device  PATIENT GOALS be able to get into the bed without assistance, improve  ambulation, standing tolerance, going up steps.   OBJECTIVE:   DIAGNOSTIC FINDINGS: MRI of the brain 03/18/2020 showed multiple T2/FLAIR hyperintense foci in the periventricular, juxtacortical and deep white matter.  There were no infratentorial lesions noted.  None  of the foci enhanced.   Due to severe claustrophobia, the study was done with conscious sedation in the hospital  MRI of the cervical spine 03/18/2020 showed T2 hyperintense foci at C1-C2, C2-C3, C4-C5, C5-C6 and T1.  There is a disc protrusion at C5-C6 to the left and a larger disc protrusion at C6-C7 to the right that distorts the thecal sac.  The adjacent spinal cord has normal signal.  There could be compression of the right C7 nerve root.   LOWER EXTREMITY MMT:    MMT Right Eval Left Eval  Hip flexion 2- 2  Hip extension 2- 2  Hip abduction 2+ 2+  Hip adduction 2+ 2+  Knee flexion 2- 2  Knee extension 2 2  Ankle dorsiflexion trace trace  Ankle plantarflexion trace trace  (Blank rows = not tested)   TODAY'S TREATMENT:   Treatment: Ambulate 62 with bRW and wheelchair follow. Cues for shifting weight and lifting limbs for foot clearance tolerated well.   Seated: 2x STS; x 3 trials for 6 total; very challenging for patient today Large swiss ball forward trunk rollout 10x Dynadisc df press 10x, pf press 10x each LE Matrix machine: -row #17.5 10x; 2 sets -tricep extension #12.5 10x -face pull #7.5 10x   PATIENT EDUCATION: Education details: goals, POC, outcome measures Person educated: Patient Education method: Explanation, Demonstration, Tactile cues, and Verbal cues Education comprehension: verbalized understanding, returned demonstration, verbal cues required, and tactile cues required   HOME EXERCISE PROGRAM: Give next session  GOALS: Goals reviewed with patient? Yes  SHORT TERM GOALS: Target date: 06/27/2022    Patient will be independent in home exercise program to improve strength/mobility for  better functional independence with ADLs. Baseline:11/9: compliant 12/19: HEP compliant  Goal status: Partially Met  LONG TERM GOALS: Target date: 08/08/2022    Patient will increase FOTO score to equal to or greater than  52%   to demonstrate statistically significant improvement in mobility and quality of life Baseline: 10/10: 42% 11/9: 40% 12/19: 40% 1/9: 36% Goal status: in progress  2.  Patient (> 69 years old) will complete five times sit to stand test in < 15 seconds indicating an increased LE strength and improved balance. Baseline: 10/10: 38 seconds with one plop heavy UE support 11/9: 26 seconds with heavy BUE support 12/19: 37 seconds with BUE support 1/9: unable to perform due to weakness s/p procedure 1/11: 29.2 seconds  Goal status: In progress  3.  Patient will increase 10 meter walk test to >0.5 m/s as to improve gait speed for better community ambulation and to reduce fall risk. Baseline: 10/10: 54 seconds with bRW andw/c follow 11/9: 43.3 seconds with RW and w/c follow 12/19: 40.9 seconds with RW and w/c follow 1/9: unable to perform due to weakness s/p procedure 1/11: 47 seconds with RW and w/c follow Goal status: In progress  4.  Patient will be able to get into bed independently with use of railing to increase independence with mobility and nighttime routine.  Baseline: 10/10: needs complete assistance to bring RLE into bed. 11/9: needs assistance with leg; 12/19: min a for RLE  Goal status: In progress  5.  Patient will go up 2 small steps to enter sisters house with min A.  Baseline: 10/10: unable to perform 11/9: unable to perform on 2 " step  12/18: unable to perform 1/9: unable to perform due to weakness s/p procedure Goal status: IN PROGRESS  6.  Patient will be able to transfer  from standard chair heights and booths for community mobility.  Baseline: 1/9: unable  Goal status:NEW  ASSESSMENT:  CLINICAL IMPRESSION: Patient is more fatigued this session,  unable to walk the further distance without LE trembling. Sit to stands more challenging but patient able to complete with rest breaks. She remains highly motivated despite fatigue. UE remain strong and no pain present through session.  Patient will continue to benefit from skilled physical therapy intervention to address impairments, improve QOL, and attain therapy goals.    OBJECTIVE IMPAIRMENTS Abnormal gait, decreased activity tolerance, decreased balance, decreased coordination, decreased endurance, decreased mobility, difficulty walking, decreased ROM, decreased strength, hypomobility, increased edema, impaired perceived functional ability, increased muscle spasms, impaired flexibility, impaired tone, impaired UE functional use, improper body mechanics, postural dysfunction, obesity, and pain.   ACTIVITY LIMITATIONS carrying, lifting, bending, standing, squatting, sleeping, stairs, transfers, bed mobility, bathing, toileting, dressing, reach over head, locomotion level, and caring for others  PARTICIPATION LIMITATIONS: meal prep, cleaning, laundry, shopping, community activity, and yard work  PERSONAL FACTORS Age, Fitness, Past/current experiences, Sex, Social background, Time since onset of injury/illness/exacerbation, Transportation, and 3+ comorbidities: back pain, CBP, chronic L shoulder pain, galactorrhea, neuropathy, HPV, hypercholestermeia, HTN, MS, osteopenia, PONV, wrist fracture  are also affecting patient's functional outcome.   REHAB POTENTIAL: Fair    CLINICAL DECISION MAKING: Evolving/moderate complexity  EVALUATION COMPLEXITY: Moderate  PLAN: PT FREQUENCY: 2x/week  PT DURATION: 12 weeks  PLANNED INTERVENTIONS: Therapeutic exercises, Therapeutic activity, Neuromuscular re-education, Balance training, Gait training, Patient/Family education, Self Care, Joint mobilization, Stair training, Vestibular training, Canalith repositioning, Visual/preceptual remediation/compensation,  Orthotic/Fit training, DME instructions, Dry Needling, Electrical stimulation, Wheelchair mobility training, Spinal mobilization, Cryotherapy, Moist heat, Taping, Vasopneumatic device, Traction, Ultrasound, Ionotophoresis 4mg /ml Dexamethasone, Manual therapy, and Re-evaluation  PLAN FOR NEXT SESSION: standing balance and strengthening, work on ambulation of decline and standing balance with decline.      Janna Arch, PT 06/01/2022, 12:11 PM

## 2022-06-01 ENCOUNTER — Ambulatory Visit: Payer: PPO

## 2022-06-01 DIAGNOSIS — M6281 Muscle weakness (generalized): Secondary | ICD-10-CM | POA: Diagnosis not present

## 2022-06-01 DIAGNOSIS — R262 Difficulty in walking, not elsewhere classified: Secondary | ICD-10-CM

## 2022-06-01 DIAGNOSIS — R2681 Unsteadiness on feet: Secondary | ICD-10-CM

## 2022-06-03 IMAGING — MR MR HEAD WO/W CM
14 of 25 series · 20 of 48 positions shown · IV contrast (gadavist)
Comparison: Head CT 07/14/2015

CLINICAL DATA: History of multiple sclerosis diagnosed 6880.
Weakness of the right leg.

EXAM:
MRI HEAD WITHOUT AND WITH CONTRAST
TECHNIQUE: Multiplanar, multiecho pulse sequences of the brain and surrounding
structures were obtained without and with intravenous contrast.
CONTRAST:  10mL GADAVIST GADOBUTROL 1 MMOL/ML IV SOLN

[Series 2: DWI · axial · 3.0mm · 0.94mm/px · z∈[-108,+26]mm · 2 of 94 slices shown (1 of 2)]
[im 1/94]
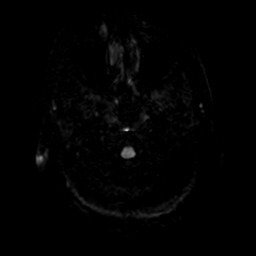
[im 94/94]
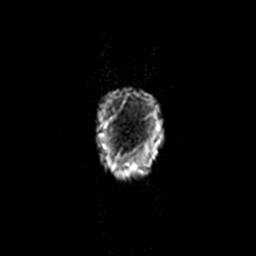

[Series 3: FLAIR · sagittal · 5.0mm · 0.47mm/px · 1 of 23 slices shown (1 of 5)]
[im 1/23]
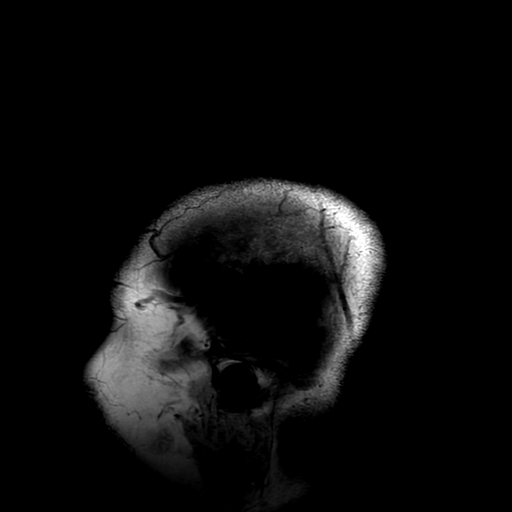

[Series 4: T2 · axial · 5.0mm · 0.23mm/px · 1 of 23 slices shown (1 of 3)]
[im 1/23]
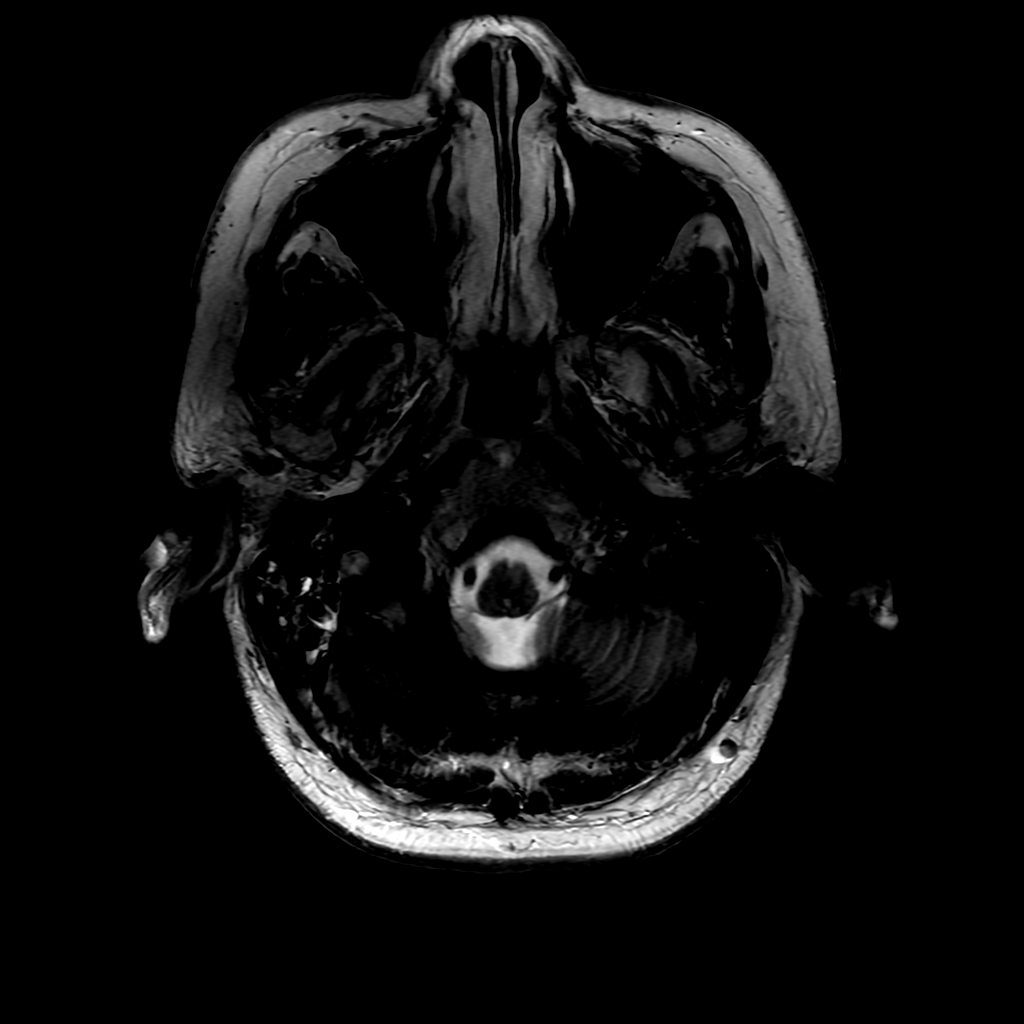

[Series 5: FLAIR · axial · 3.0mm · 0.47mm/px · 1 of 23 slices shown (2 of 5)]
[im 1/23]
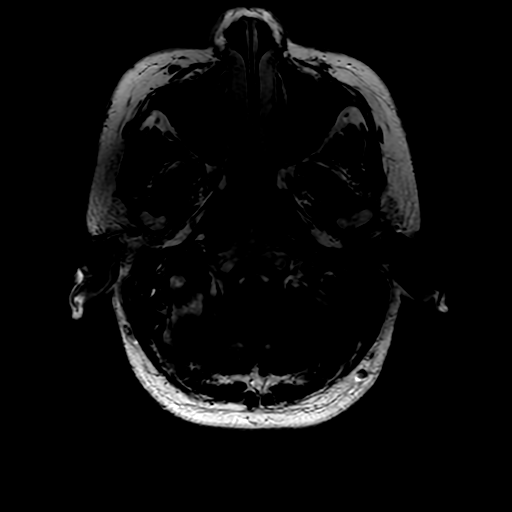

[Series 6: FLAIR · sagittal · 1.6mm · 0.49mm/px · 6 of 224 slices shown (3 of 5)]
[im 1/224]
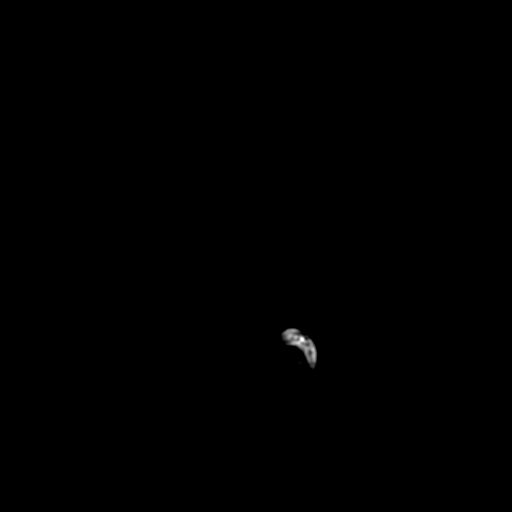
[im 45/224]
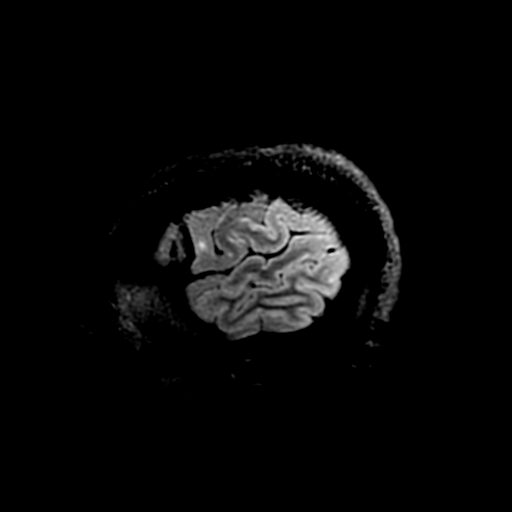
[im 90/224]
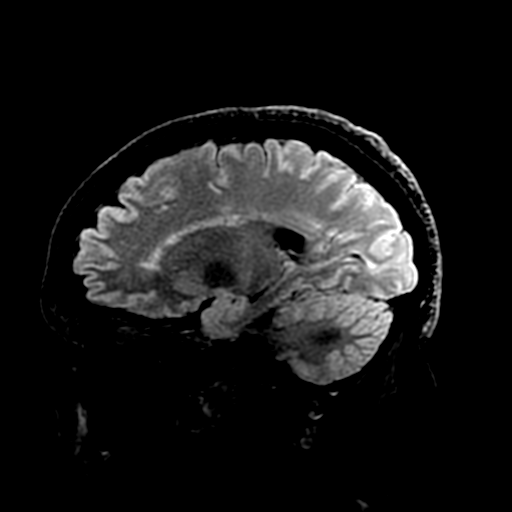
[im 134/224]
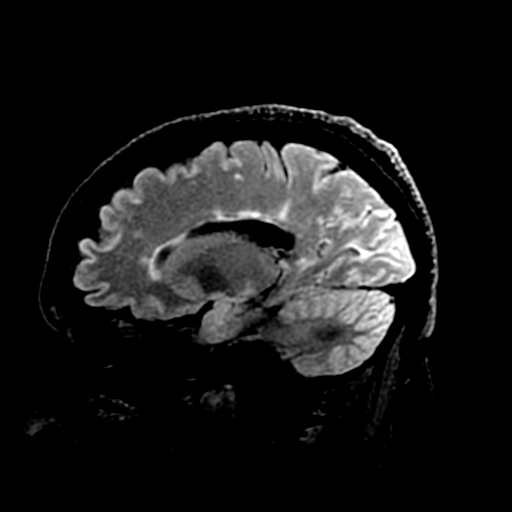
[im 179/224]
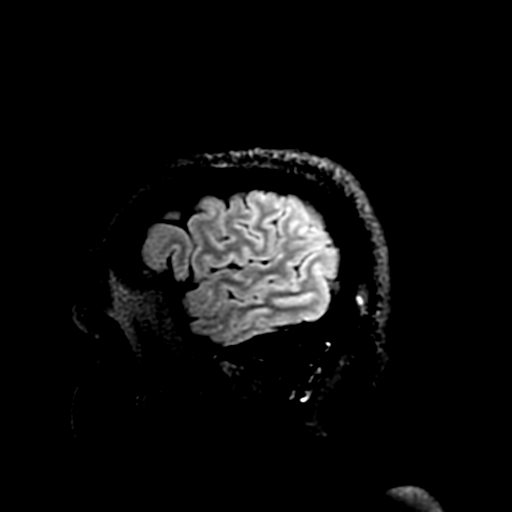
[im 224/224]
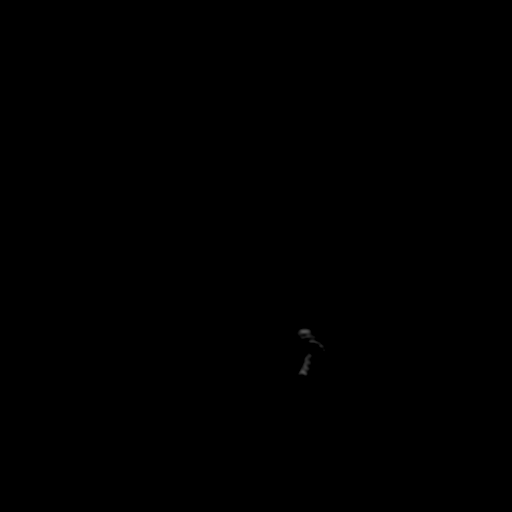

[Series 7: DWI · coronal · 5.0mm · 0.94mm/px · 1 of 51 slices shown (2 of 2)]
[im 1/51]
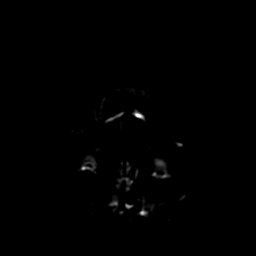

[Series 10: T2 · coronal · 5.0mm · 0.39mm/px · 1 of 29 slices shown (2 of 3)]
[im 1/29]
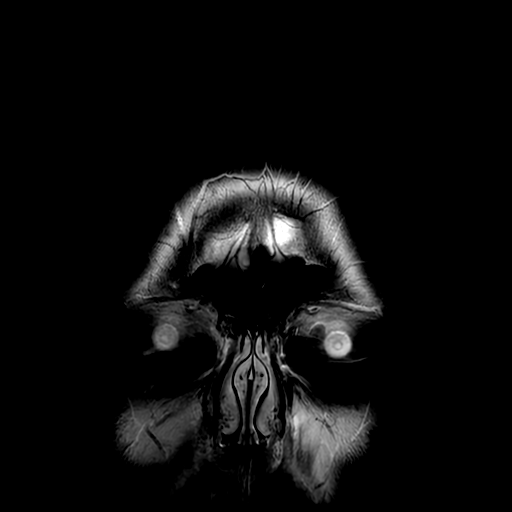

[Series 13: T2 · sagittal · 3.0mm · 0.43mm/px · 1 of 17 slices shown (3 of 3)]
[im 1/17]
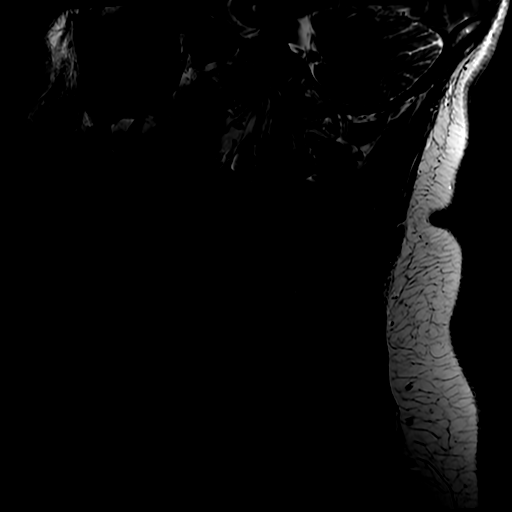

[Series 14: FLAIR · sagittal · 3.0mm · 0.43mm/px · 1 of 17 slices shown (4 of 5)]
[im 1/17]
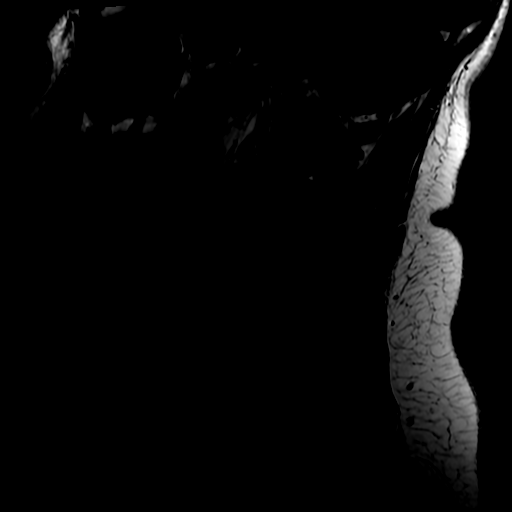

[Series 20: T1 post-contrast · axial · 3.0mm · 0.35mm/px · 1 of 36 slices shown (1 of 2)]
[im 1/36]
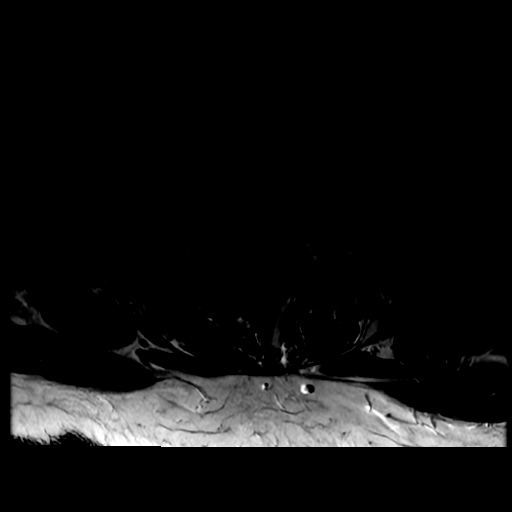

[Series 22: T1 post-contrast · coronal · 5.0mm · 0.39mm/px · 1 of 29 slices shown (2 of 2)]
[im 1/29]
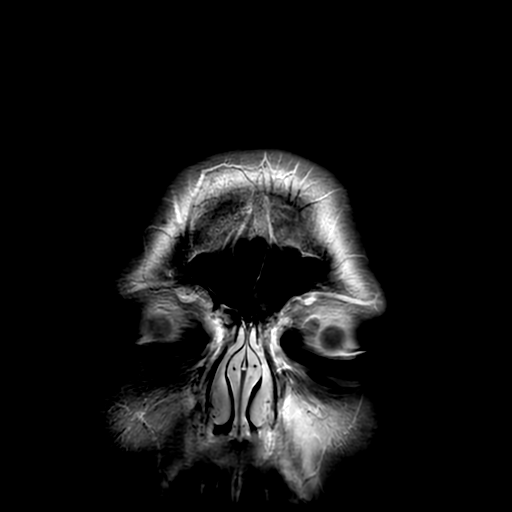

[Series 23: FLAIR · sagittal · 5.0mm · 0.47mm/px · 1 of 23 slices shown (5 of 5)]
[im 1/23]
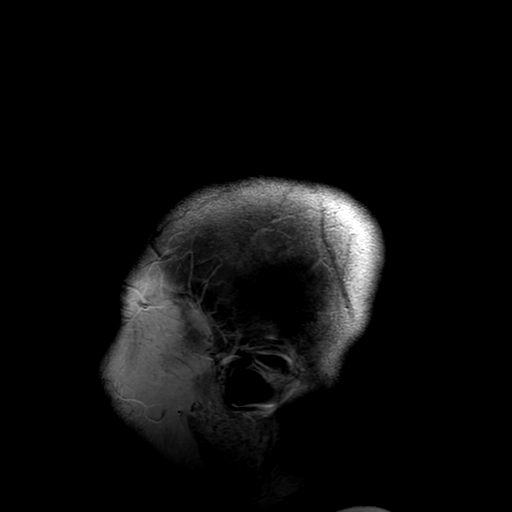

[Series 250: ADC · axial · 3.0mm · 0.94mm/px · 1 of 47 slices shown (1 of 2)]
[im 1/47]
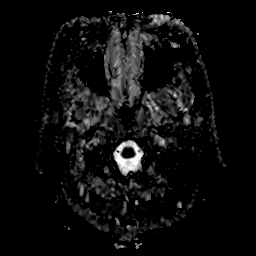

[Series 750: ADC · coronal · 5.0mm · 0.94mm/px · 1 of 34 slices shown (2 of 2)]
[im 1/34]
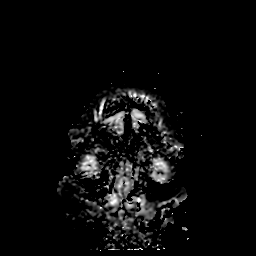

[20 of 48 positions shown; findings below may reference images not displayed]

FINDINGS: Brain: Diffusion imaging does not show any acute or subacute
infarction or other cause of restricted diffusion. Few areas of T2
shine through are noted. No abnormality seen affecting the brainstem
or cerebellum. Cerebral hemispheres show foci of abnormal T2 and
FLAIR signal within the deep more than subcortical white matter of
both hemispheres, 10-20 foci in each hemisphere. Some cortical and
subcortical involvement at the right parietal vertex. None show
swelling or contrast enhancement. No evidence of advanced
generalized atrophy, hydrocephalus, mass lesion or extra-axial
collection.

Vascular: Major vessels at the base of the brain show flow.

Skull and upper cervical spine: Negative

Sinuses/Orbits: Clear/normal

Other: None
IMPRESSION: Scattered foci of abnormal T2 and FLAIR signal within the cerebral
hemispheric white matter, 10-20 foci in each hemisphere. Some
cortical and subcortical involvement particularly at the right
parietal vertex. The findings are consistent with chronic multiple
sclerosis. No lesions show restricted diffusion or contrast
enhancement.

## 2022-06-05 NOTE — Therapy (Incomplete)
OUTPATIENT PHYSICAL THERAPY NEURO TREATMENT/ Physical Therapy Progress Note   Dates of reporting period  04/26/23   to   06/06/22   Patient Name: Lisa Williams MRN: [id number] DOB:1961-02-26, 62 y.o., female Today's Date: 06/05/2022  PCP: Lauro Regulus  REFERRING PROVIDER: Asa Lente MD              Past Medical History:  Diagnosis Date   Abdominal pain, right upper quadrant    Back pain    Calculus of kidney 12/09/2013   Chronic back pain    unspecified   Chronic left shoulder pain 07/19/2015   Complication of anesthesia    Functional disorder of bladder    other   Galactorrhea 11/26/2014   Chronic    Hereditary and idiopathic neuropathy 08/19/2013   History of kidney stones    HPV test positive    Hypercholesteremia 08/19/2013   Hypertension    Incomplete bladder emptying    Microscopic hematuria    MS (multiple sclerosis) (HCC)    Muscle spasticity 05/21/2014   Nonspecific findings on examination of urine    other   Osteopenia    PONV (postoperative nausea and vomiting)    Status post laparoscopic supracervical hysterectomy 11/26/2014   Tobacco user 11/26/2014   Wrist fracture    Past Surgical History:  Procedure Laterality Date   bilateral tubal ligation  1996   BREAST CYST EXCISION Left 2002   CYST EXCISION Left 05/10/2022   Procedure: CYST REMOVAL;  Surgeon: Carolan Shiver, MD;  Location: [facility];  Service: General;  Laterality: Left;   FRACTURE SURGERY     KNEE SURGERY     right   LAPAROSCOPIC SUPRACERVICAL HYSTERECTOMY  08/05/2013   ORIF WRIST FRACTURE Left 01/17/2017   Procedure: OPEN REDUCTION INTERNAL FIXATION (ORIF) WRIST FRACTURE;  Surgeon: Lyndle Herrlich, MD;  Location: [facility];  Service: Orthopedics;  Laterality: Left;   RADIOLOGY WITH ANESTHESIA N/A 03/18/2020   Procedure: MRI WITH ANESTHESIA CERVICAL SPINE AND BRAIN  WITH AND WITHOUT CONTRAST;  Surgeon: Radiologist, Medication, MD;  Location: [facility];   Service: Radiology;  Laterality: N/A;   TUBAL LIGATION Bilateral    VAGINAL HYSTERECTOMY  03/2006   Patient Active Problem List   Diagnosis Date Noted   Abscess of left groin 11/15/2021   Abnormal LFTs 11/15/2021   Acute respiratory disease due to COVID-19 virus 11/21/2020   Weakness    Hypoalbuminemia due to protein-calorie malnutrition (HCC)    Neurogenic bowel    Neurogenic bladder    Labile blood pressure    Neuropathic pain    Abscess of female pelvis    SVT (supraventricular tachycardia)    Radial styloid tenosynovitis 03/12/2018   Wheelchair confinement 02/27/2018   Localized osteoporosis with current pathological fracture with routine healing 01/19/2017   Wrist fracture 01/16/2017   Sprain of ankle 03/23/2016   Closed fracture of lateral malleolus 03/16/2016   Health care maintenance 01/24/2016   Blood pressure elevated without history of HTN 10/25/2015   Essential hypertension 10/25/2015   Multiple sclerosis (HCC) 10/02/2015   Chronic left shoulder pain 07/19/2015   Multiple sclerosis exacerbation (HCC) 07/14/2015   MS (multiple sclerosis) (HCC) 11/26/2014   Increased body mass index 11/26/2014   HPV test positive 11/26/2014   Status post laparoscopic supracervical hysterectomy 11/26/2014   Galactorrhea 11/26/2014   Back ache 05/21/2014   Adiposity 05/21/2014   Disordered sleep 05/21/2014   Muscle spasticity 05/21/2014   Spasticity 05/21/2014   Calculus of kidney 12/09/2013  Renal colic 12/09/2013   Hypercholesteremia 08/19/2013   Hereditary and idiopathic neuropathy 08/19/2013   Hypercholesterolemia without hypertriglyceridemia 08/19/2013   Bladder infection, chronic 07/25/2012   Disorder of bladder function 07/25/2012   Incomplete bladder emptying 07/25/2012   Microscopic hematuria 07/25/2012   Right upper quadrant pain 07/25/2012   ONSET DATE: 1995  REFERRING DIAG: MS  THERAPY DIAG:  No diagnosis found.  Rationale for Evaluation and Treatment  Rehabilitation  SUBJECTIVE:                                                                                                                                                                                          SUBJECTIVE STATEMENT: *** .   Pt accompanied by: self  PERTINENT HISTORY: Patient is retuning to PT after hospitalization on 11/14/21 where she then went to SNF and then received home health therapy which stopped 02/08/22. Patient has weakness in BLE with RLE>LLE. She drives with hand controls. Patient has been diagnosed with MS in 1995.  PMH includes: back pain, CBP, chronic L shoulder pain, galactorrhea, neuropathy, HPV, hypercholestermeia, HTN, MS, osteopenia, PONV, wrist fracture. Started back driving about two weeks ago.   PAIN:  Are you having pain? Yes: NPRS scale: 2/10 Pain location: abdomen to R leg Pain description: spasm Aggravating factors: unknown Relieving factors: n/a  PRECAUTIONS: Fall  WEIGHT BEARING RESTRICTIONS No  FALLS: Has patient fallen in last 6 months? No  LIVING ENVIRONMENT: Lives with: lives with their family Lives in: House/apartment Stairs: Yes: Internal: full flight steps; but lives fully on first floor Has following equipment at home: Dan Humphreys - 2 wheeled, Wheelchair (manual), shower chair, Grab bars, and Ramped entry  PLOF: Independent with household mobility with device  PATIENT GOALS be able to get into the bed without assistance, improve ambulation, standing tolerance, going up steps.   OBJECTIVE:   DIAGNOSTIC FINDINGS: MRI of the brain 03/18/2020 showed multiple T2/FLAIR hyperintense foci in the periventricular, juxtacortical and deep white matter.  There were no infratentorial lesions noted.  None of the foci enhanced.   Due to severe claustrophobia, the study was done with conscious sedation in the hospital  MRI of the cervical spine 03/18/2020 showed T2 hyperintense foci at C1-C2, C2-C3, C4-C5, C5-C6 and T1.  There is a disc protrusion  at C5-C6 to the left and a larger disc protrusion at C6-C7 to the right that distorts the thecal sac.  The adjacent spinal cord has normal signal.  There could be compression of the right C7 nerve root.   LOWER EXTREMITY MMT:    MMT Right Eval Left Eval  Hip flexion 2- 2  Hip extension 2-  2  Hip abduction 2+ 2+  Hip adduction 2+ 2+  Knee flexion 2- 2  Knee extension 2 2  Ankle dorsiflexion trace trace  Ankle plantarflexion trace trace  (Blank rows = not tested)   TODAY'S TREATMENT:  Goals: see below for details   Treatment: Ambulate 62 with bRW and wheelchair follow. Cues for shifting weight and lifting limbs for foot clearance tolerated well.   Seated: 2x STS; x 3 trials for 6 total; very challenging for patient today Large swiss ball forward trunk rollout 10x Dynadisc df press 10x, pf press 10x each LE Matrix machine: -row #17.5 10x; 2 sets -tricep extension #12.5 10x -face pull #7.5 10x   PATIENT EDUCATION: Education details: goals, POC, outcome measures Person educated: Patient Education method: Explanation, Demonstration, Tactile cues, and Verbal cues Education comprehension: verbalized understanding, returned demonstration, verbal cues required, and tactile cues required   HOME EXERCISE PROGRAM: Give next session  GOALS: Goals reviewed with patient? Yes  SHORT TERM GOALS: Target date: 06/27/2022    Patient will be independent in home exercise program to improve strength/mobility for better functional independence with ADLs. Baseline:11/9: compliant 12/19: HEP compliant  Goal status: Partially Met  LONG TERM GOALS: Target date: 08/08/2022    Patient will increase FOTO score to equal to or greater than  52%   to demonstrate statistically significant improvement in mobility and quality of life Baseline: 10/10: 42% 11/9: 40% 12/19: 40% 1/9: 36% Goal status: in progress  2.  Patient (> 61 years old) will complete five times sit to stand test in < 15  seconds indicating an increased LE strength and improved balance. Baseline: 10/10: 38 seconds with one plop heavy UE support 11/9: 26 seconds with heavy BUE support 12/19: 37 seconds with BUE support 1/9: unable to perform due to weakness s/p procedure 1/11: 29.2 seconds  Goal status: In progress  3.  Patient will increase 10 meter walk test to >0.5 m/s as to improve gait speed for better community ambulation and to reduce fall risk. Baseline: 10/10: 54 seconds with bRW andw/c follow 11/9: 43.3 seconds with RW and w/c follow 12/19: 40.9 seconds with RW and w/c follow 1/9: unable to perform due to weakness s/p procedure 1/11: 47 seconds with RW and w/c follow Goal status: In progress  4.  Patient will be able to get into bed independently with use of railing to increase independence with mobility and nighttime routine.  Baseline: 10/10: needs complete assistance to bring RLE into bed. 11/9: needs assistance with leg; 12/19: min a for RLE  Goal status: In progress  5.  Patient will go up 2 small steps to enter sisters house with min A.  Baseline: 10/10: unable to perform 11/9: unable to perform on 2 " step  12/18: unable to perform 1/9: unable to perform due to weakness s/p procedure Goal status: IN PROGRESS  6.  Patient will be able to transfer from standard chair heights and booths for community mobility.  Baseline: 1/9: unable  Goal status:NEW  ASSESSMENT:  CLINICAL IMPRESSION: *** Patient's condition has the potential to improve in response to therapy. Maximum improvement is yet to be obtained. The anticipated improvement is attainable and reasonable in a generally predictable time.   Patient will continue to benefit from skilled physical therapy intervention to address impairments, improve QOL, and attain therapy goals.    OBJECTIVE IMPAIRMENTS Abnormal gait, decreased activity tolerance, decreased balance, decreased coordination, decreased endurance, decreased mobility, difficulty  walking, decreased ROM, decreased strength, hypomobility,  increased edema, impaired perceived functional ability, increased muscle spasms, impaired flexibility, impaired tone, impaired UE functional use, improper body mechanics, postural dysfunction, obesity, and pain.   ACTIVITY LIMITATIONS carrying, lifting, bending, standing, squatting, sleeping, stairs, transfers, bed mobility, bathing, toileting, dressing, reach over head, locomotion level, and caring for others  PARTICIPATION LIMITATIONS: meal prep, cleaning, laundry, shopping, community activity, and yard work  PERSONAL FACTORS Age, Fitness, Past/current experiences, Sex, Social background, Time since onset of injury/illness/exacerbation, Transportation, and 3+ comorbidities: back pain, CBP, chronic L shoulder pain, galactorrhea, neuropathy, HPV, hypercholestermeia, HTN, MS, osteopenia, PONV, wrist fracture  are also affecting patient's functional outcome.   REHAB POTENTIAL: Fair    CLINICAL DECISION MAKING: Evolving/moderate complexity  EVALUATION COMPLEXITY: Moderate  PLAN: PT FREQUENCY: 2x/week  PT DURATION: 12 weeks  PLANNED INTERVENTIONS: Therapeutic exercises, Therapeutic activity, Neuromuscular re-education, Balance training, Gait training, Patient/Family education, Self Care, Joint mobilization, Stair training, Vestibular training, Canalith repositioning, Visual/preceptual remediation/compensation, Orthotic/Fit training, DME instructions, Dry Needling, Electrical stimulation, Wheelchair mobility training, Spinal mobilization, Cryotherapy, Moist heat, Taping, Vasopneumatic device, Traction, Ultrasound, Ionotophoresis 4mg /ml Dexamethasone, Manual therapy, and Re-evaluation  PLAN FOR NEXT SESSION: standing balance and strengthening, work on ambulation of decline and standing balance with decline.      Janna Arch, PT 06/05/2022, 11:58 AM

## 2022-06-06 ENCOUNTER — Ambulatory Visit: Payer: PPO

## 2022-06-06 DIAGNOSIS — M6281 Muscle weakness (generalized): Secondary | ICD-10-CM | POA: Diagnosis not present

## 2022-06-06 DIAGNOSIS — R278 Other lack of coordination: Secondary | ICD-10-CM

## 2022-06-06 DIAGNOSIS — R262 Difficulty in walking, not elsewhere classified: Secondary | ICD-10-CM

## 2022-06-06 DIAGNOSIS — R269 Unspecified abnormalities of gait and mobility: Secondary | ICD-10-CM

## 2022-06-06 DIAGNOSIS — R2681 Unsteadiness on feet: Secondary | ICD-10-CM

## 2022-06-06 NOTE — Therapy (Signed)
OUTPATIENT PHYSICAL THERAPY NEURO TREATMENT  Patient Name: Lisa Williams MRN: [id number] DOB:1960-07-04, 62 y.o., female Today's Date: 06/06/2022  PCP: Kirk Ruths  REFERRING PROVIDER: Britt Bottom MD   PT End of Session - 06/06/22 1101     Visit Number 30    Number of Visits 48    Date for PT Re-Evaluation 08/08/22    PT Start Time 4431    PT Stop Time 1053    PT Time Calculation (min) 38 min    Equipment Utilized During Treatment Gait belt    Activity Tolerance Patient tolerated treatment well;Patient limited by fatigue    Behavior During Therapy [facility] for tasks assessed/performed                       Past Medical History:  Diagnosis Date   Abdominal pain, right upper quadrant    Back pain    Calculus of kidney 12/09/2013   Chronic back pain    unspecified   Chronic left shoulder pain 54/00/8676   Complication of anesthesia    Functional disorder of bladder    other   Galactorrhea 11/26/2014   Chronic    Hereditary and idiopathic neuropathy 08/19/2013   History of kidney stones    HPV test positive    Hypercholesteremia 08/19/2013   Hypertension    Incomplete bladder emptying    Microscopic hematuria    MS (multiple sclerosis) (HCC)    Muscle spasticity 05/21/2014   Nonspecific findings on examination of urine    other   Osteopenia    PONV (postoperative nausea and vomiting)    Status post laparoscopic supracervical hysterectomy 11/26/2014   Tobacco user 11/26/2014   Wrist fracture    Past Surgical History:  Procedure Laterality Date   bilateral tubal ligation  1996   BREAST CYST EXCISION Left 2002   CYST EXCISION Left 05/10/2022   Procedure: CYST REMOVAL;  Surgeon: Herbert Pun, MD;  Location: [facility];  Service: General;  Laterality: Left;   FRACTURE SURGERY     KNEE SURGERY     right   LAPAROSCOPIC SUPRACERVICAL HYSTERECTOMY  08/05/2013   ORIF WRIST FRACTURE Left 01/17/2017   Procedure: OPEN REDUCTION  INTERNAL FIXATION (ORIF) WRIST FRACTURE;  Surgeon: Lovell Sheehan, MD;  Location: [facility];  Service: Orthopedics;  Laterality: Left;   RADIOLOGY WITH ANESTHESIA N/A 03/18/2020   Procedure: MRI WITH ANESTHESIA CERVICAL SPINE AND BRAIN  WITH AND WITHOUT CONTRAST;  Surgeon: Radiologist, Medication, MD;  Location: [place];  Service: Radiology;  Laterality: N/A;   TUBAL LIGATION Bilateral    VAGINAL HYSTERECTOMY  03/2006   Patient Active Problem List   Diagnosis Date Noted   Abscess of left groin 11/15/2021   Abnormal LFTs 11/15/2021   Acute respiratory disease due to COVID-19 virus 11/21/2020   Weakness    Hypoalbuminemia due to protein-calorie malnutrition (HCC)    Neurogenic bowel    Neurogenic bladder    Labile blood pressure    Neuropathic pain    Abscess of female pelvis    SVT (supraventricular tachycardia)    Radial styloid tenosynovitis 03/12/2018   Wheelchair confinement 02/27/2018   Localized osteoporosis with current pathological fracture with routine healing 01/19/2017   Wrist fracture 01/16/2017   Sprain of ankle 03/23/2016   Closed fracture of lateral malleolus 03/16/2016   Health care maintenance 01/24/2016   Blood pressure elevated without history of HTN 10/25/2015   Essential hypertension 10/25/2015   Multiple sclerosis ([place]) 10/02/2015  Chronic left shoulder pain 07/19/2015   Multiple sclerosis exacerbation ([place]) 07/14/2015   MS (multiple sclerosis) ([place]) 11/26/2014   Increased body mass index 11/26/2014   HPV test positive 11/26/2014   Status post laparoscopic supracervical hysterectomy 11/26/2014   Galactorrhea 11/26/2014   Back ache 05/21/2014   Adiposity 05/21/2014   Disordered sleep 05/21/2014   Muscle spasticity 05/21/2014   Spasticity 05/21/2014   Calculus of kidney 38/75/6433   Renal colic 29/51/8841   Hypercholesteremia 08/19/2013   Hereditary and idiopathic neuropathy 08/19/2013   Hypercholesterolemia without hypertriglyceridemia 08/19/2013    Bladder infection, chronic 07/25/2012   Disorder of bladder function 07/25/2012   Incomplete bladder emptying 07/25/2012   Microscopic hematuria 07/25/2012   Right upper quadrant pain 07/25/2012   ONSET DATE: 1995  REFERRING DIAG: MS  THERAPY DIAG:  Muscle weakness (generalized)  Difficulty in walking, not elsewhere classified  Unsteadiness on feet  Other lack of coordination  Abnormality of gait and mobility  Rationale for Evaluation and Treatment Rehabilitation  SUBJECTIVE:                                                                                                                                                                                          SUBJECTIVE STATEMENT: Patient reports feeling tired. Has her mom's birthday this weekend.  .   Pt accompanied by: self  PERTINENT HISTORY: Patient returns to [facility] after recent hiatus while hospitalized 11/14/21, DC to SNF and then received home health therapy which stopped 02/08/22. Patient has weakness in BLE with RLE>LLE. She drives with hand controls. Patient has been diagnosed with MS in 1995.  PMH includes: back pain, CBP, chronic L shoulder pain, galactorrhea, neuropathy, HPV, hypercholestermeia, HTN, MS, osteopenia, PONV, wrist fracture. Started back driving about two weeks ago.   PAIN:  Are you having pain? Yes: NPRS scale: 2/10 Pain location: abdomen to R leg Pain description: spasm Aggravating factors: unknown Relieving factors: n/a  PRECAUTIONS: Fall  WEIGHT BEARING RESTRICTIONS No  FALLS: Has patient fallen in last 6 months? No  LIVING ENVIRONMENT: Lives with: lives with their family Lives in: House/apartment Stairs: Yes: Internal: full flight steps; but lives fully on first floor Has following equipment at home: Gilford Rile - 2 wheeled, Wheelchair (manual), shower chair, Grab bars, and Ramped entry  PLOF: Independent with household mobility with device  PATIENT GOALS be able to get into the bed without  assistance, improve ambulation, standing tolerance, going up steps.   OBJECTIVE:   TODAY'S TREATMENT 06/06/22 :  Ambulate 64ft c BRW and wheelchair follow, 1 seated rest break (2 STS during AMB)  Matrix cable machine: -row #17.5 10x; 2  sets -tricep extension #12.5 10x, 2 sets  -seated horizontal ADD, Palov Press BUE 2x10 bilat, 12.5# (per pt preference)  -?face pull #7.5 2x10 ? (not sure what this is) -2x STS from WC + BRW  (3 sets)  -Large Swiss ball rollouts x10 FWD and side to side  -in WC  -weighted bar 3.5lb BUE biceps curls (supinated) 1x10, Row/chest press 10x, overhead press 1x10, stirring the pot x10 CW, x10 CCW -seated trunk flexion extension 10x (reverse sit-ups)  -windmill x10 bilat, alternating sides    PATIENT EDUCATION: Education details: goals, POC, outcome measures Person educated: Patient Education method: Explanation, Demonstration, Tactile cues, and Verbal cues Education comprehension: verbalized understanding, returned demonstration, verbal cues required, and tactile cues required   HOME EXERCISE PROGRAM: Give next session  GOALS: Goals reviewed with patient? Yes  SHORT TERM GOALS: Target date: 06/27/2022    Patient will be independent in home exercise program to improve strength/mobility for better functional independence with ADLs. Baseline:11/9: compliant 12/19: HEP compliant  Goal status: Partially Met  LONG TERM GOALS: Target date: 08/08/2022    Patient will increase FOTO score to equal to or greater than  52%   to demonstrate statistically significant improvement in mobility and quality of life Baseline: 10/10: 42% 11/9: 40% 12/19: 40% 1/9: 36% Goal status: in progress  2.  Patient (> 62 years old) will complete five times sit to stand test in < 15 seconds indicating an increased LE strength and improved balance. Baseline: 10/10: 38 seconds with one plop heavy UE support 11/9: 26 seconds with heavy BUE support 12/19: 37 seconds with BUE  support 1/9: unable to perform due to weakness s/p procedure 1/11: 29.2 seconds  Goal status: In progress  3.  Patient will increase 10 meter walk test to >0.5 m/s as to improve gait speed for better community ambulation and to reduce fall risk. Baseline: 10/10: 54 seconds with bRW andw/c follow 11/9: 43.3 seconds with RW and w/c follow 12/19: 40.9 seconds with RW and w/c follow 1/9: unable to perform due to weakness s/p procedure 1/11: 47 seconds with RW and w/c follow Goal status: In progress  4.  Patient will be able to get into bed independently with use of railing to increase independence with mobility and nighttime routine.  Baseline: 10/10: needs complete assistance to bring RLE into bed. 11/9: needs assistance with leg; 12/19: min a for RLE  Goal status: In progress  5.  Patient will go up 2 small steps to enter sisters house with min A.  Baseline: 10/10: unable to perform 11/9: unable to perform on 2 " step  12/18: unable to perform 1/9: unable to perform due to weakness s/p procedure Goal status: IN PROGRESS  6.  Patient will be able to transfer from standard chair heights and booths for community mobility.  Baseline: 1/9: unable  Goal status:NEW  ASSESSMENT:  CLINICAL IMPRESSION: Pt tolerates session well today, predictable fatigue levels which she manages well with revoery intervals between exercises. Continued with her current regimen to maintain core strength, motor control, UE strength. Pt helps explain certain exercises that Thereasa Parkin is unfamiliar with. She remains highly motivated despite fatigue. UE remain strong and no pain present through session. Patient will continue to benefit from skilled physical therapy intervention to address impairments, improve QOL, and attain therapy goals.    OBJECTIVE IMPAIRMENTS Abnormal gait, decreased activity tolerance, decreased balance, decreased coordination, decreased endurance, decreased mobility, difficulty walking, decreased ROM,  decreased strength, hypomobility, increased edema, impaired perceived  functional ability, increased muscle spasms, impaired flexibility, impaired tone, impaired UE functional use, improper body mechanics, postural dysfunction, obesity, and pain.   ACTIVITY LIMITATIONS carrying, lifting, bending, standing, squatting, sleeping, stairs, transfers, bed mobility, bathing, toileting, dressing, reach over head, locomotion level, and caring for others  PARTICIPATION LIMITATIONS: meal prep, cleaning, laundry, shopping, community activity, and yard work  PERSONAL FACTORS Age, Fitness, Past/current experiences, Sex, Social background, Time since onset of injury/illness/exacerbation, Transportation, and 3+ comorbidities: back pain, CBP, chronic L shoulder pain, galactorrhea, neuropathy, HPV, hypercholestermeia, HTN, MS, osteopenia, PONV, wrist fracture  are also affecting patient's functional outcome.   REHAB POTENTIAL: Fair    CLINICAL DECISION MAKING: Evolving/moderate complexity  EVALUATION COMPLEXITY: Moderate  PLAN: PT FREQUENCY: 2x/week  PT DURATION: 12 weeks  PLANNED INTERVENTIONS: Therapeutic exercises, Therapeutic activity, Neuromuscular re-education, Balance training, Gait training, Patient/Family education, Self Care, Joint mobilization, Stair training, Vestibular training, Canalith repositioning, Visual/preceptual remediation/compensation, Orthotic/Fit training, DME instructions, Dry Needling, Electrical stimulation, Wheelchair mobility training, Spinal mobilization, Cryotherapy, Moist heat, Taping, Vasopneumatic device, Traction, Ultrasound, Ionotophoresis 4mg /ml Dexamethasone, Manual therapy, and Re-evaluation  PLAN FOR NEXT SESSION: standing balance and strengthening, work on ambulation of decline and standing balance with decline.    11:01 AM, 06/06/22 Etta Grandchild, PT, DPT Physical Therapist - [facility]  Outpatient Physical Therapy- [place] [phone]     Adreana Coull C, PT 06/06/2022, 11:01 AM

## 2022-06-07 NOTE — Therapy (Signed)
OUTPATIENT PHYSICAL THERAPY NEURO TREATMENT  Patient Name: Lisa Williams MRN: [id number] DOB:09-26-1960, 62 y.o., female Today's Date: 06/08/2022  PCP: Lauro Regulus  REFERRING PROVIDER: Asa Lente MD   PT End of Session - 06/08/22 0958     Visit Number 31    Number of Visits 48    Date for PT Re-Evaluation 08/08/22    PT Start Time 1015    PT Stop Time 1058    PT Time Calculation (min) 43 min    Equipment Utilized During Treatment Gait belt    Activity Tolerance Patient tolerated treatment well;Patient limited by fatigue    Behavior During Therapy [facility] for tasks assessed/performed                       Past Medical History:  Diagnosis Date   Abdominal pain, right upper quadrant    Back pain    Calculus of kidney 12/09/2013   Chronic back pain    unspecified   Chronic left shoulder pain 07/19/2015   Complication of anesthesia    Functional disorder of bladder    other   Galactorrhea 11/26/2014   Chronic    Hereditary and idiopathic neuropathy 08/19/2013   History of kidney stones    HPV test positive    Hypercholesteremia 08/19/2013   Hypertension    Incomplete bladder emptying    Microscopic hematuria    MS (multiple sclerosis) (HCC)    Muscle spasticity 05/21/2014   Nonspecific findings on examination of urine    other   Osteopenia    PONV (postoperative nausea and vomiting)    Status post laparoscopic supracervical hysterectomy 11/26/2014   Tobacco user 11/26/2014   Wrist fracture    Past Surgical History:  Procedure Laterality Date   bilateral tubal ligation  1996   BREAST CYST EXCISION Left 2002   CYST EXCISION Left 05/10/2022   Procedure: CYST REMOVAL;  Surgeon: Carolan Shiver, MD;  Location: [facility];  Service: General;  Laterality: Left;   FRACTURE SURGERY     KNEE SURGERY     right   LAPAROSCOPIC SUPRACERVICAL HYSTERECTOMY  08/05/2013   ORIF WRIST FRACTURE Left 01/17/2017   Procedure: OPEN REDUCTION  INTERNAL FIXATION (ORIF) WRIST FRACTURE;  Surgeon: Lyndle Herrlich, MD;  Location: [facility];  Service: Orthopedics;  Laterality: Left;   RADIOLOGY WITH ANESTHESIA N/A 03/18/2020   Procedure: MRI WITH ANESTHESIA CERVICAL SPINE AND BRAIN  WITH AND WITHOUT CONTRAST;  Surgeon: Radiologist, Medication, MD;  Location: [facility];  Service: Radiology;  Laterality: N/A;   TUBAL LIGATION Bilateral    VAGINAL HYSTERECTOMY  03/2006   Patient Active Problem List   Diagnosis Date Noted   Abscess of left groin 11/15/2021   Abnormal LFTs 11/15/2021   Acute respiratory disease due to COVID-19 virus 11/21/2020   Weakness    Hypoalbuminemia due to protein-calorie malnutrition (HCC)    Neurogenic bowel    Neurogenic bladder    Labile blood pressure    Neuropathic pain    Abscess of female pelvis    SVT (supraventricular tachycardia)    Radial styloid tenosynovitis 03/12/2018   Wheelchair confinement 02/27/2018   Localized osteoporosis with current pathological fracture with routine healing 01/19/2017   Wrist fracture 01/16/2017   Sprain of ankle 03/23/2016   Closed fracture of lateral malleolus 03/16/2016   Health care maintenance 01/24/2016   Blood pressure elevated without history of HTN 10/25/2015   Essential hypertension 10/25/2015   Multiple sclerosis (HCC) 10/02/2015  Chronic left shoulder pain 07/19/2015   Multiple sclerosis exacerbation ([place]) 07/14/2015   MS (multiple sclerosis) ([place]) 11/26/2014   Increased body mass index 11/26/2014   HPV test positive 11/26/2014   Status post laparoscopic supracervical hysterectomy 11/26/2014   Galactorrhea 11/26/2014   Back ache 05/21/2014   Adiposity 05/21/2014   Disordered sleep 05/21/2014   Muscle spasticity 05/21/2014   Spasticity 05/21/2014   Calculus of kidney 45/80/9983   Renal colic 38/25/0539   Hypercholesteremia 08/19/2013   Hereditary and idiopathic neuropathy 08/19/2013   Hypercholesterolemia without hypertriglyceridemia 08/19/2013    Bladder infection, chronic 07/25/2012   Disorder of bladder function 07/25/2012   Incomplete bladder emptying 07/25/2012   Microscopic hematuria 07/25/2012   Right upper quadrant pain 07/25/2012   ONSET DATE: 1995  REFERRING DIAG: MS  THERAPY DIAG:  Muscle weakness (generalized)  Difficulty in walking, not elsewhere classified  Unsteadiness on feet  Rationale for Evaluation and Treatment Rehabilitation  SUBJECTIVE:                                                                                                                                                                                          SUBJECTIVE STATEMENT: Patient is going to dentist after appointment today. Has been compliant with HEP.  .   Pt accompanied by: self  PERTINENT HISTORY: Patient is retuning to PT after hospitalization on 11/14/21 where she then went to SNF and then received home health therapy which stopped 02/08/22. Patient has weakness in BLE with RLE>LLE. She drives with hand controls. Patient has been diagnosed with MS in 1995.  PMH includes: back pain, CBP, chronic L shoulder pain, galactorrhea, neuropathy, HPV, hypercholestermeia, HTN, MS, osteopenia, PONV, wrist fracture. Started back driving about two weeks ago.   PAIN:  Are you having pain? Yes: NPRS scale: 2/10 Pain location: abdomen to R leg Pain description: spasm Aggravating factors: unknown Relieving factors: n/a  PRECAUTIONS: Fall  WEIGHT BEARING RESTRICTIONS No  FALLS: Has patient fallen in last 6 months? No  LIVING ENVIRONMENT: Lives with: lives with their family Lives in: House/apartment Stairs: Yes: Internal: full flight steps; but lives fully on first floor Has following equipment at home: Gilford Rile - 2 wheeled, Wheelchair (manual), shower chair, Grab bars, and Ramped entry  PLOF: Independent with household mobility with device  PATIENT GOALS be able to get into the bed without assistance, improve ambulation, standing  tolerance, going up steps.   OBJECTIVE:   DIAGNOSTIC FINDINGS: MRI of the brain 03/18/2020 showed multiple T2/FLAIR hyperintense foci in the periventricular, juxtacortical and deep white matter.  There were no infratentorial lesions noted.  None of the foci enhanced.  Due to severe claustrophobia, the study was done with conscious sedation in the hospital  MRI of the cervical spine 03/18/2020 showed T2 hyperintense foci at C1-C2, C2-C3, C4-C5, C5-C6 and T1.  There is a disc protrusion at C5-C6 to the left and a larger disc protrusion at C6-C7 to the right that distorts the thecal sac.  The adjacent spinal cord has normal signal.  There could be compression of the right C7 nerve root.   LOWER EXTREMITY MMT:    MMT Right Eval Left Eval  Hip flexion 2- 2  Hip extension 2- 2  Hip abduction 2+ 2+  Hip adduction 2+ 2+  Knee flexion 2- 2  Knee extension 2 2  Ankle dorsiflexion trace trace  Ankle plantarflexion trace trace  (Blank rows = not tested)   TODAY'S TREATMENT:  GOALS : see below:   Treatment: Ambulate 62 with bRW and wheelchair follow. Cues for shifting weight and lifting limbs for foot clearance tolerated well.   Seated: 2x STS; x 3 trials for 6 total; very challenging for patient today Large swiss ball forward trunk rollout 10x Dynadisc df press 10x, pf press 10x each LE Matrix machine: -row #17.5 10x; 2 sets -tricep extension #12.5 10x -face pull #7.5 10x   PATIENT EDUCATION: Education details: goals, POC, outcome measures Person educated: Patient Education method: Explanation, Demonstration, Tactile cues, and Verbal cues Education comprehension: verbalized understanding, returned demonstration, verbal cues required, and tactile cues required   HOME EXERCISE PROGRAM: Give next session  GOALS: Goals reviewed with patient? Yes  SHORT TERM GOALS: Target date: 06/27/2022    Patient will be independent in home exercise program to improve strength/mobility for  better functional independence with ADLs. Baseline:11/9: compliant 12/19: HEP compliant  Goal status: Partially Met  LONG TERM GOALS: Target date: 08/08/2022    Patient will increase FOTO score to equal to or greater than  52%   to demonstrate statistically significant improvement in mobility and quality of life Baseline: 10/10: 42% 11/9: 40% 12/19: 40% 1/9: 36% 2/1: 44%  Goal status: in progress  2.  Patient (> 76 years old) will complete five times sit to stand test in < 15 seconds indicating an increased LE strength and improved balance. Baseline: 10/10: 38 seconds with one plop heavy UE support 11/9: 26 seconds with heavy BUE support 12/19: 37 seconds with BUE support 1/9: unable to perform due to weakness s/p procedure 1/11: 29.2 seconds  2/1: 27 seconds with BUE support  Goal status: In progress  3.  Patient will increase 10 meter walk test to >0.5 m/s as to improve gait speed for better community ambulation and to reduce fall risk. Baseline: 10/10: 54 seconds with bRW andw/c follow 11/9: 43.3 seconds with RW and w/c follow 12/19: 40.9 seconds with RW and w/c follow 1/9: unable to perform due to weakness s/p procedure 1/11: 47 seconds with RW and w/c follow 2/1: 42.58 seconds with RW and w/c follow  Goal status: In progress  4.  Patient will be able to get into bed independently with use of railing to increase independence with mobility and nighttime routine.  Baseline: 10/10: needs complete assistance to bring RLE into bed. 11/9: needs assistance with leg; 12/19: min a for RLE 2/1: min A for RLE getting into bed  Goal status: In progress  5.  Patient will go up 2 small steps to enter sisters house with min A.  Baseline: 10/10: unable to perform 11/9: unable to perform on 2 " step  12/18: unable to perform 1/9: unable to perform due to weakness s/p procedure 2/1: unable to perform  Goal status: IN PROGRESS  6.  Patient will be able to transfer from standard chair heights and booths  for community mobility.  Baseline: 1/9: unable 2/1: has not had a chance to do yet Goal status: Ongoing.  ASSESSMENT:  CLINICAL IMPRESSION: Patient's goals assessed as the previous session was a progress note with covering therapist as primary therapist was absent.  Patient makes excellent progress towards functional goals with increased speed of transfers and ambulation. Her FOTO additionally has improved. Patient's condition has the potential to improve in response to therapy. Maximum improvement is yet to be obtained. The anticipated improvement is attainable and reasonable in a generally predictable time.  Patient will continue to benefit from skilled physical therapy intervention to address impairments, improve QOL, and attain therapy goals.    OBJECTIVE IMPAIRMENTS Abnormal gait, decreased activity tolerance, decreased balance, decreased coordination, decreased endurance, decreased mobility, difficulty walking, decreased ROM, decreased strength, hypomobility, increased edema, impaired perceived functional ability, increased muscle spasms, impaired flexibility, impaired tone, impaired UE functional use, improper body mechanics, postural dysfunction, obesity, and pain.   ACTIVITY LIMITATIONS carrying, lifting, bending, standing, squatting, sleeping, stairs, transfers, bed mobility, bathing, toileting, dressing, reach over head, locomotion level, and caring for others  PARTICIPATION LIMITATIONS: meal prep, cleaning, laundry, shopping, community activity, and yard work  PERSONAL FACTORS Age, Fitness, Past/current experiences, Sex, Social background, Time since onset of injury/illness/exacerbation, Transportation, and 3+ comorbidities: back pain, CBP, chronic L shoulder pain, galactorrhea, neuropathy, HPV, hypercholestermeia, HTN, MS, osteopenia, PONV, wrist fracture  are also affecting patient's functional outcome.   REHAB POTENTIAL: Fair    CLINICAL DECISION MAKING: Evolving/moderate  complexity  EVALUATION COMPLEXITY: Moderate  PLAN: PT FREQUENCY: 2x/week  PT DURATION: 12 weeks  PLANNED INTERVENTIONS: Therapeutic exercises, Therapeutic activity, Neuromuscular re-education, Balance training, Gait training, Patient/Family education, Self Care, Joint mobilization, Stair training, Vestibular training, Canalith repositioning, Visual/preceptual remediation/compensation, Orthotic/Fit training, DME instructions, Dry Needling, Electrical stimulation, Wheelchair mobility training, Spinal mobilization, Cryotherapy, Moist heat, Taping, Vasopneumatic device, Traction, Ultrasound, Ionotophoresis 4mg /ml Dexamethasone, Manual therapy, and Re-evaluation  PLAN FOR NEXT SESSION: standing balance and strengthening, work on ambulation of decline and standing balance with decline.      Janna Arch, PT 06/08/2022, 10:59 AM

## 2022-06-08 ENCOUNTER — Ambulatory Visit: Payer: PPO | Attending: Neurology

## 2022-06-08 DIAGNOSIS — R262 Difficulty in walking, not elsewhere classified: Secondary | ICD-10-CM | POA: Diagnosis not present

## 2022-06-08 DIAGNOSIS — R2681 Unsteadiness on feet: Secondary | ICD-10-CM | POA: Diagnosis not present

## 2022-06-08 DIAGNOSIS — R269 Unspecified abnormalities of gait and mobility: Secondary | ICD-10-CM | POA: Insufficient documentation

## 2022-06-08 DIAGNOSIS — M6281 Muscle weakness (generalized): Secondary | ICD-10-CM | POA: Diagnosis not present

## 2022-06-12 NOTE — Therapy (Signed)
OUTPATIENT PHYSICAL THERAPY NEURO TREATMENT  Patient Name: Lisa Williams MRN: [id number] DOB:11-15-1960, 62 y.o., female Today's Date: 06/13/2022  PCP: Kirk Ruths  REFERRING PROVIDER: Britt Bottom MD   PT End of Session - 06/13/22 1012     Visit Number 32    Number of Visits 48    Date for PT Re-Evaluation 08/08/22    PT Start Time 4132    PT Stop Time 1059    PT Time Calculation (min) 44 min    Equipment Utilized During Treatment Gait belt    Activity Tolerance Patient tolerated treatment well;Patient limited by fatigue    Behavior During Therapy [facility] for tasks assessed/performed                        Past Medical History:  Diagnosis Date   Abdominal pain, right upper quadrant    Back pain    Calculus of kidney 12/09/2013   Chronic back pain    unspecified   Chronic left shoulder pain 44/05/270   Complication of anesthesia    Functional disorder of bladder    other   Galactorrhea 11/26/2014   Chronic    Hereditary and idiopathic neuropathy 08/19/2013   History of kidney stones    HPV test positive    Hypercholesteremia 08/19/2013   Hypertension    Incomplete bladder emptying    Microscopic hematuria    MS (multiple sclerosis) (HCC)    Muscle spasticity 05/21/2014   Nonspecific findings on examination of urine    other   Osteopenia    PONV (postoperative nausea and vomiting)    Status post laparoscopic supracervical hysterectomy 11/26/2014   Tobacco user 11/26/2014   Wrist fracture    Past Surgical History:  Procedure Laterality Date   bilateral tubal ligation  1996   BREAST CYST EXCISION Left 2002   CYST EXCISION Left 05/10/2022   Procedure: CYST REMOVAL;  Surgeon: Herbert Pun, MD;  Location: [facility];  Service: General;  Laterality: Left;   FRACTURE SURGERY     KNEE SURGERY     right   LAPAROSCOPIC SUPRACERVICAL HYSTERECTOMY  08/05/2013   ORIF WRIST FRACTURE Left 01/17/2017   Procedure: OPEN REDUCTION  INTERNAL FIXATION (ORIF) WRIST FRACTURE;  Surgeon: Lovell Sheehan, MD;  Location: [facility];  Service: Orthopedics;  Laterality: Left;   RADIOLOGY WITH ANESTHESIA N/A 03/18/2020   Procedure: MRI WITH ANESTHESIA CERVICAL SPINE AND BRAIN  WITH AND WITHOUT CONTRAST;  Surgeon: Radiologist, Medication, MD;  Location: [place];  Service: Radiology;  Laterality: N/A;   TUBAL LIGATION Bilateral    VAGINAL HYSTERECTOMY  03/2006   Patient Active Problem List   Diagnosis Date Noted   Abscess of left groin 11/15/2021   Abnormal LFTs 11/15/2021   Acute respiratory disease due to COVID-19 virus 11/21/2020   Weakness    Hypoalbuminemia due to protein-calorie malnutrition (HCC)    Neurogenic bowel    Neurogenic bladder    Labile blood pressure    Neuropathic pain    Abscess of female pelvis    SVT (supraventricular tachycardia)    Radial styloid tenosynovitis 03/12/2018   Wheelchair confinement 02/27/2018   Localized osteoporosis with current pathological fracture with routine healing 01/19/2017   Wrist fracture 01/16/2017   Sprain of ankle 03/23/2016   Closed fracture of lateral malleolus 03/16/2016   Health care maintenance 01/24/2016   Blood pressure elevated without history of HTN 10/25/2015   Essential hypertension 10/25/2015   Multiple sclerosis ([place])  10/02/2015   Chronic left shoulder pain 07/19/2015   Multiple sclerosis exacerbation ([place]) 07/14/2015   MS (multiple sclerosis) ([place]) 11/26/2014   Increased body mass index 11/26/2014   HPV test positive 11/26/2014   Status post laparoscopic supracervical hysterectomy 11/26/2014   Galactorrhea 11/26/2014   Back ache 05/21/2014   Adiposity 05/21/2014   Disordered sleep 05/21/2014   Muscle spasticity 05/21/2014   Spasticity 05/21/2014   Calculus of kidney 50/01/3817   Renal colic 29/93/7169   Hypercholesteremia 08/19/2013   Hereditary and idiopathic neuropathy 08/19/2013   Hypercholesterolemia without hypertriglyceridemia 08/19/2013    Bladder infection, chronic 07/25/2012   Disorder of bladder function 07/25/2012   Incomplete bladder emptying 07/25/2012   Microscopic hematuria 07/25/2012   Right upper quadrant pain 07/25/2012   ONSET DATE: 1995  REFERRING DIAG: MS  THERAPY DIAG:  Muscle weakness (generalized)  Difficulty in walking, not elsewhere classified  Unsteadiness on feet  Rationale for Evaluation and Treatment Rehabilitation  SUBJECTIVE:                                                                                                                                                                                          SUBJECTIVE STATEMENT: Patient reports no falls or LOB since last session.  .   Pt accompanied by: self  PERTINENT HISTORY: Patient is retuning to PT after hospitalization on 11/14/21 where she then went to SNF and then received home health therapy which stopped 02/08/22. Patient has weakness in BLE with RLE>LLE. She drives with hand controls. Patient has been diagnosed with MS in 1995.  PMH includes: back pain, CBP, chronic L shoulder pain, galactorrhea, neuropathy, HPV, hypercholestermeia, HTN, MS, osteopenia, PONV, wrist fracture. Started back driving about two weeks ago.   PAIN:  Are you having pain? Yes: NPRS scale: 2/10 Pain location: abdomen to R leg Pain description: spasm Aggravating factors: unknown Relieving factors: n/a  PRECAUTIONS: Fall  WEIGHT BEARING RESTRICTIONS No  FALLS: Has patient fallen in last 6 months? No  LIVING ENVIRONMENT: Lives with: lives with their family Lives in: House/apartment Stairs: Yes: Internal: full flight steps; but lives fully on first floor Has following equipment at home: Gilford Rile - 2 wheeled, Wheelchair (manual), shower chair, Grab bars, and Ramped entry  PLOF: Independent with household mobility with device  PATIENT GOALS be able to get into the bed without assistance, improve ambulation, standing tolerance, going up steps.    OBJECTIVE:   DIAGNOSTIC FINDINGS: MRI of the brain 03/18/2020 showed multiple T2/FLAIR hyperintense foci in the periventricular, juxtacortical and deep white matter.  There were no infratentorial lesions noted.  None of the foci enhanced.   Due  to severe claustrophobia, the study was done with conscious sedation in the hospital  MRI of the cervical spine 03/18/2020 showed T2 hyperintense foci at C1-C2, C2-C3, C4-C5, C5-C6 and T1.  There is a disc protrusion at C5-C6 to the left and a larger disc protrusion at C6-C7 to the right that distorts the thecal sac.  The adjacent spinal cord has normal signal.  There could be compression of the right C7 nerve root.   LOWER EXTREMITY MMT:    MMT Right Eval Left Eval  Hip flexion 2- 2  Hip extension 2- 2  Hip abduction 2+ 2+  Hip adduction 2+ 2+  Knee flexion 2- 2  Knee extension 2 2  Ankle dorsiflexion trace trace  Ankle plantarflexion trace trace  (Blank rows = not tested)   TODAY'S TREATMENT:    Treatment: Ambulate 92 with bRW and wheelchair follow; one seated rest break. Cues for shifting weight and lifting limbs for foot clearance tolerated well.   Seated: Seated on dynadisc: -lateral trunk leans 10x each side -anterior/posterior trunk leans 10x each side -twists 10x each side -alternat elbow to knee 5x each side Hip flexion isometric 10x 3 second holds Hip abduction isometric 10x 3 second holds Hip adduction isometric 10x 3 second holds Hand clap for coordination sequencing and prolonged muscle recruitment. X 3 minutes  PATIENT EDUCATION: Education details: goals, POC, outcome measures Person educated: Patient Education method: Explanation, Demonstration, Tactile cues, and Verbal cues Education comprehension: verbalized understanding, returned demonstration, verbal cues required, and tactile cues required   HOME EXERCISE PROGRAM: Give next session  GOALS: Goals reviewed with patient? Yes  SHORT TERM GOALS: Target  date: 06/27/2022    Patient will be independent in home exercise program to improve strength/mobility for better functional independence with ADLs. Baseline:11/9: compliant 12/19: HEP compliant  Goal status: Partially Met  LONG TERM GOALS: Target date: 08/08/2022    Patient will increase FOTO score to equal to or greater than  52%   to demonstrate statistically significant improvement in mobility and quality of life Baseline: 10/10: 42% 11/9: 40% 12/19: 40% 1/9: 36% 2/1: 44%  Goal status: in progress  2.  Patient (> 73 years old) will complete five times sit to stand test in < 15 seconds indicating an increased LE strength and improved balance. Baseline: 10/10: 38 seconds with one plop heavy UE support 11/9: 26 seconds with heavy BUE support 12/19: 37 seconds with BUE support 1/9: unable to perform due to weakness s/p procedure 1/11: 29.2 seconds  2/1: 27 seconds with BUE support  Goal status: In progress  3.  Patient will increase 10 meter walk test to >0.5 m/s as to improve gait speed for better community ambulation and to reduce fall risk. Baseline: 10/10: 54 seconds with bRW andw/c follow 11/9: 43.3 seconds with RW and w/c follow 12/19: 40.9 seconds with RW and w/c follow 1/9: unable to perform due to weakness s/p procedure 1/11: 47 seconds with RW and w/c follow 2/1: 42.58 seconds with RW and w/c follow  Goal status: In progress  4.  Patient will be able to get into bed independently with use of railing to increase independence with mobility and nighttime routine.  Baseline: 10/10: needs complete assistance to bring RLE into bed. 11/9: needs assistance with leg; 12/19: min a for RLE 2/1: min A for RLE getting into bed  Goal status: In progress  5.  Patient will go up 2 small steps to enter sisters house with min A.  Baseline:  10/10: unable to perform 11/9: unable to perform on 2 " step  12/18: unable to perform 1/9: unable to perform due to weakness s/p procedure 2/1: unable to perform   Goal status: IN PROGRESS  6.  Patient will be able to transfer from standard chair heights and booths for community mobility.  Baseline: 1/9: unable 2/1: has not had a chance to do yet Goal status: Ongoing.  ASSESSMENT:  CLINICAL IMPRESSION: Patient ambulated further duration this session with single rest break. She is able to maintain stability while people and carts pass her. Cues for sequencing and safety performed. Seated core stabilization tolerated well with no reports of pain however she does have multiple episodes of instability.  Patient will continue to benefit from skilled physical therapy intervention to address impairments, improve QOL, and attain therapy goals.    OBJECTIVE IMPAIRMENTS Abnormal gait, decreased activity tolerance, decreased balance, decreased coordination, decreased endurance, decreased mobility, difficulty walking, decreased ROM, decreased strength, hypomobility, increased edema, impaired perceived functional ability, increased muscle spasms, impaired flexibility, impaired tone, impaired UE functional use, improper body mechanics, postural dysfunction, obesity, and pain.   ACTIVITY LIMITATIONS carrying, lifting, bending, standing, squatting, sleeping, stairs, transfers, bed mobility, bathing, toileting, dressing, reach over head, locomotion level, and caring for others  PARTICIPATION LIMITATIONS: meal prep, cleaning, laundry, shopping, community activity, and yard work  PERSONAL FACTORS Age, Fitness, Past/current experiences, Sex, Social background, Time since onset of injury/illness/exacerbation, Transportation, and 3+ comorbidities: back pain, CBP, chronic L shoulder pain, galactorrhea, neuropathy, HPV, hypercholestermeia, HTN, MS, osteopenia, PONV, wrist fracture  are also affecting patient's functional outcome.   REHAB POTENTIAL: Fair    CLINICAL DECISION MAKING: Evolving/moderate complexity  EVALUATION COMPLEXITY: Moderate  PLAN: PT FREQUENCY:  2x/week  PT DURATION: 12 weeks  PLANNED INTERVENTIONS: Therapeutic exercises, Therapeutic activity, Neuromuscular re-education, Balance training, Gait training, Patient/Family education, Self Care, Joint mobilization, Stair training, Vestibular training, Canalith repositioning, Visual/preceptual remediation/compensation, Orthotic/Fit training, DME instructions, Dry Needling, Electrical stimulation, Wheelchair mobility training, Spinal mobilization, Cryotherapy, Moist heat, Taping, Vasopneumatic device, Traction, Ultrasound, Ionotophoresis 4mg /ml Dexamethasone, Manual therapy, and Re-evaluation  PLAN FOR NEXT SESSION: standing balance and strengthening, work on ambulation of decline and standing balance with decline.      Janna Arch, PT 06/13/2022, 11:11 AM

## 2022-06-13 ENCOUNTER — Ambulatory Visit: Payer: PPO

## 2022-06-13 DIAGNOSIS — R2681 Unsteadiness on feet: Secondary | ICD-10-CM

## 2022-06-13 DIAGNOSIS — R262 Difficulty in walking, not elsewhere classified: Secondary | ICD-10-CM

## 2022-06-13 DIAGNOSIS — M6281 Muscle weakness (generalized): Secondary | ICD-10-CM | POA: Diagnosis not present

## 2022-06-14 NOTE — Therapy (Signed)
OUTPATIENT PHYSICAL THERAPY NEURO TREATMENT  Patient Name: Lisa Williams MRN: [id number] DOB:Jan 03, 1961, 62 y.o., female Today's Date: 06/15/2022  PCP: Kirk Ruths  REFERRING PROVIDER: Britt Bottom MD   PT End of Session - 06/15/22 0956     Visit Number 33    Number of Visits 48    Date for PT Re-Evaluation 08/08/22    PT Start Time [id number]    PT Stop Time 1059    PT Time Calculation (min) 44 min    Equipment Utilized During Treatment Gait belt    Activity Tolerance Patient tolerated treatment well;Patient limited by fatigue    Behavior During Therapy [facility] for tasks assessed/performed                         Past Medical History:  Diagnosis Date   Abdominal pain, right upper quadrant    Back pain    Calculus of kidney 12/09/2013   Chronic back pain    unspecified   Chronic left shoulder pain 03/47/4259   Complication of anesthesia    Functional disorder of bladder    other   Galactorrhea 11/26/2014   Chronic    Hereditary and idiopathic neuropathy 08/19/2013   History of kidney stones    HPV test positive    Hypercholesteremia 08/19/2013   Hypertension    Incomplete bladder emptying    Microscopic hematuria    MS (multiple sclerosis) (HCC)    Muscle spasticity 05/21/2014   Nonspecific findings on examination of urine    other   Osteopenia    PONV (postoperative nausea and vomiting)    Status post laparoscopic supracervical hysterectomy 11/26/2014   Tobacco user 11/26/2014   Wrist fracture    Past Surgical History:  Procedure Laterality Date   bilateral tubal ligation  1996   BREAST CYST EXCISION Left 2002   CYST EXCISION Left 05/10/2022   Procedure: CYST REMOVAL;  Surgeon: Herbert Pun, MD;  Location: [facility];  Service: General;  Laterality: Left;   FRACTURE SURGERY     KNEE SURGERY     right   LAPAROSCOPIC SUPRACERVICAL HYSTERECTOMY  08/05/2013   ORIF WRIST FRACTURE Left 01/17/2017   Procedure: OPEN REDUCTION  INTERNAL FIXATION (ORIF) WRIST FRACTURE;  Surgeon: Lovell Sheehan, MD;  Location: [facility];  Service: Orthopedics;  Laterality: Left;   RADIOLOGY WITH ANESTHESIA N/A 03/18/2020   Procedure: MRI WITH ANESTHESIA CERVICAL SPINE AND BRAIN  WITH AND WITHOUT CONTRAST;  Surgeon: Radiologist, Medication, MD;  Location: [place];  Service: Radiology;  Laterality: N/A;   TUBAL LIGATION Bilateral    VAGINAL HYSTERECTOMY  03/2006   Patient Active Problem List   Diagnosis Date Noted   Abscess of left groin 11/15/2021   Abnormal LFTs 11/15/2021   Acute respiratory disease due to COVID-19 virus 11/21/2020   Weakness    Hypoalbuminemia due to protein-calorie malnutrition (HCC)    Neurogenic bowel    Neurogenic bladder    Labile blood pressure    Neuropathic pain    Abscess of female pelvis    SVT (supraventricular tachycardia)    Radial styloid tenosynovitis 03/12/2018   Wheelchair confinement 02/27/2018   Localized osteoporosis with current pathological fracture with routine healing 01/19/2017   Wrist fracture 01/16/2017   Sprain of ankle 03/23/2016   Closed fracture of lateral malleolus 03/16/2016   Health care maintenance 01/24/2016   Blood pressure elevated without history of HTN 10/25/2015   Essential hypertension 10/25/2015   Multiple sclerosis (  [place]) 10/02/2015   Chronic left shoulder pain 07/19/2015   Multiple sclerosis exacerbation ([place]) 07/14/2015   MS (multiple sclerosis) ([place]) 11/26/2014   Increased body mass index 11/26/2014   HPV test positive 11/26/2014   Status post laparoscopic supracervical hysterectomy 11/26/2014   Galactorrhea 11/26/2014   Back ache 05/21/2014   Adiposity 05/21/2014   Disordered sleep 05/21/2014   Muscle spasticity 05/21/2014   Spasticity 05/21/2014   Calculus of kidney 70/35/0093   Renal colic 81/82/9937   Hypercholesteremia 08/19/2013   Hereditary and idiopathic neuropathy 08/19/2013   Hypercholesterolemia without hypertriglyceridemia 08/19/2013    Bladder infection, chronic 07/25/2012   Disorder of bladder function 07/25/2012   Incomplete bladder emptying 07/25/2012   Microscopic hematuria 07/25/2012   Right upper quadrant pain 07/25/2012   ONSET DATE: 1995  REFERRING DIAG: MS  THERAPY DIAG:  Muscle weakness (generalized)  Difficulty in walking, not elsewhere classified  Unsteadiness on feet  Rationale for Evaluation and Treatment Rehabilitation  SUBJECTIVE:                                                                                                                                                                                          SUBJECTIVE STATEMENT: Patient reports she is feeling good today. No falls or LOB since last session.  .   Pt accompanied by: self  PERTINENT HISTORY: Patient is retuning to PT after hospitalization on 11/14/21 where she then went to SNF and then received home health therapy which stopped 02/08/22. Patient has weakness in BLE with RLE>LLE. She drives with hand controls. Patient has been diagnosed with MS in 1995.  PMH includes: back pain, CBP, chronic L shoulder pain, galactorrhea, neuropathy, HPV, hypercholestermeia, HTN, MS, osteopenia, PONV, wrist fracture. Started back driving about two weeks ago.   PAIN:  Are you having pain? Yes: NPRS scale: 2/10 Pain location: abdomen to R leg Pain description: spasm Aggravating factors: unknown Relieving factors: n/a  PRECAUTIONS: Fall  WEIGHT BEARING RESTRICTIONS No  FALLS: Has patient fallen in last 6 months? No  LIVING ENVIRONMENT: Lives with: lives with their family Lives in: House/apartment Stairs: Yes: Internal: full flight steps; but lives fully on first floor Has following equipment at home: Gilford Rile - 2 wheeled, Wheelchair (manual), shower chair, Grab bars, and Ramped entry  PLOF: Independent with household mobility with device  PATIENT GOALS be able to get into the bed without assistance, improve ambulation, standing tolerance,  going up steps.   OBJECTIVE:   DIAGNOSTIC FINDINGS: MRI of the brain 03/18/2020 showed multiple T2/FLAIR hyperintense foci in the periventricular, juxtacortical and deep white matter.  There were no infratentorial lesions noted.  None of  the foci enhanced.   Due to severe claustrophobia, the study was done with conscious sedation in the hospital  MRI of the cervical spine 03/18/2020 showed T2 hyperintense foci at C1-C2, C2-C3, C4-C5, C5-C6 and T1.  There is a disc protrusion at C5-C6 to the left and a larger disc protrusion at C6-C7 to the right that distorts the thecal sac.  The adjacent spinal cord has normal signal.  There could be compression of the right C7 nerve root.   LOWER EXTREMITY MMT:    MMT Right Eval Left Eval  Hip flexion 2- 2  Hip extension 2- 2  Hip abduction 2+ 2+  Hip adduction 2+ 2+  Knee flexion 2- 2  Knee extension 2 2  Ankle dorsiflexion trace trace  Ankle plantarflexion trace trace  (Blank rows = not tested)   TODAY'S TREATMENT:    Treatment: Ambulate 41ft with bRW and wheelchair follow; one seated rest break. Cues for shifting weight and lifting limbs for foot clearance tolerated well.   Seated: Sit to stand 2x; 3 sets for 6 total 5lb bar: -bicep curl 12x -reverse curl 12x -overhead press 12x -witches brew 12x clockwise, 12x counterclockwise  PATIENT EDUCATION: Education details: goals, POC, outcome measures Person educated: Patient Education method: Explanation, Demonstration, Tactile cues, and Verbal cues Education comprehension: verbalized understanding, returned demonstration, verbal cues required, and tactile cues required   HOME EXERCISE PROGRAM: Give next session  GOALS: Goals reviewed with patient? Yes  SHORT TERM GOALS: Target date: 06/27/2022    Patient will be independent in home exercise program to improve strength/mobility for better functional independence with ADLs. Baseline:11/9: compliant 12/19: HEP compliant  Goal  status: Partially Met  LONG TERM GOALS: Target date: 08/08/2022    Patient will increase FOTO score to equal to or greater than  52%   to demonstrate statistically significant improvement in mobility and quality of life Baseline: 10/10: 42% 11/9: 40% 12/19: 40% 1/9: 36% 2/1: 44%  Goal status: in progress  2.  Patient (> 79 years old) will complete five times sit to stand test in < 15 seconds indicating an increased LE strength and improved balance. Baseline: 10/10: 38 seconds with one plop heavy UE support 11/9: 26 seconds with heavy BUE support 12/19: 37 seconds with BUE support 1/9: unable to perform due to weakness s/p procedure 1/11: 29.2 seconds  2/1: 27 seconds with BUE support  Goal status: In progress  3.  Patient will increase 10 meter walk test to >0.5 m/s as to improve gait speed for better community ambulation and to reduce fall risk. Baseline: 10/10: 54 seconds with bRW andw/c follow 11/9: 43.3 seconds with RW and w/c follow 12/19: 40.9 seconds with RW and w/c follow 1/9: unable to perform due to weakness s/p procedure 1/11: 47 seconds with RW and w/c follow 2/1: 42.58 seconds with RW and w/c follow  Goal status: In progress  4.  Patient will be able to get into bed independently with use of railing to increase independence with mobility and nighttime routine.  Baseline: 10/10: needs complete assistance to bring RLE into bed. 11/9: needs assistance with leg; 12/19: min a for RLE 2/1: min A for RLE getting into bed  Goal status: In progress  5.  Patient will go up 2 small steps to enter sisters house with min A.  Baseline: 10/10: unable to perform 11/9: unable to perform on 2 " step  12/18: unable to perform 1/9: unable to perform due to weakness s/p procedure 2/1:  unable to perform  Goal status: IN PROGRESS  6.  Patient will be able to transfer from standard chair heights and booths for community mobility.  Baseline: 1/9: unable 2/1: has not had a chance to do yet Goal status:  Ongoing.  ASSESSMENT:  CLINICAL IMPRESSION: Patient tolerates ambulation in a busy hallway with people passing. Sit to stands continue to challenge her resulting in severe fatigue. Patient continues to be highly motivated throughout session and eager to participate in interventions.  Patient will continue to benefit from skilled physical therapy intervention to address impairments, improve QOL, and attain therapy goals.    OBJECTIVE IMPAIRMENTS Abnormal gait, decreased activity tolerance, decreased balance, decreased coordination, decreased endurance, decreased mobility, difficulty walking, decreased ROM, decreased strength, hypomobility, increased edema, impaired perceived functional ability, increased muscle spasms, impaired flexibility, impaired tone, impaired UE functional use, improper body mechanics, postural dysfunction, obesity, and pain.   ACTIVITY LIMITATIONS carrying, lifting, bending, standing, squatting, sleeping, stairs, transfers, bed mobility, bathing, toileting, dressing, reach over head, locomotion level, and caring for others  PARTICIPATION LIMITATIONS: meal prep, cleaning, laundry, shopping, community activity, and yard work  PERSONAL FACTORS Age, Fitness, Past/current experiences, Sex, Social background, Time since onset of injury/illness/exacerbation, Transportation, and 3+ comorbidities: back pain, CBP, chronic L shoulder pain, galactorrhea, neuropathy, HPV, hypercholestermeia, HTN, MS, osteopenia, PONV, wrist fracture  are also affecting patient's functional outcome.   REHAB POTENTIAL: Fair    CLINICAL DECISION MAKING: Evolving/moderate complexity  EVALUATION COMPLEXITY: Moderate  PLAN: PT FREQUENCY: 2x/week  PT DURATION: 12 weeks  PLANNED INTERVENTIONS: Therapeutic exercises, Therapeutic activity, Neuromuscular re-education, Balance training, Gait training, Patient/Family education, Self Care, Joint mobilization, Stair training, Vestibular training, Canalith  repositioning, Visual/preceptual remediation/compensation, Orthotic/Fit training, DME instructions, Dry Needling, Electrical stimulation, Wheelchair mobility training, Spinal mobilization, Cryotherapy, Moist heat, Taping, Vasopneumatic device, Traction, Ultrasound, Ionotophoresis 4mg /ml Dexamethasone, Manual therapy, and Re-evaluation  PLAN FOR NEXT SESSION: standing balance and strengthening, work on ambulation of decline and standing balance with decline.      , PT 06/15/2022, 12:21 PM

## 2022-06-15 ENCOUNTER — Ambulatory Visit: Payer: PPO

## 2022-06-15 DIAGNOSIS — R2681 Unsteadiness on feet: Secondary | ICD-10-CM

## 2022-06-15 DIAGNOSIS — R262 Difficulty in walking, not elsewhere classified: Secondary | ICD-10-CM

## 2022-06-15 DIAGNOSIS — M6281 Muscle weakness (generalized): Secondary | ICD-10-CM | POA: Diagnosis not present

## 2022-06-19 NOTE — Therapy (Signed)
OUTPATIENT PHYSICAL THERAPY NEURO TREATMENT  Patient Name: Lisa Williams MRN: [id number] DOB:09/14/1960, 63 y.o., female Today's Date: 06/20/2022  PCP: Kirk Ruths  REFERRING PROVIDER: Britt Bottom MD   PT End of Session - 06/20/22 1004     Visit Number 34    Number of Visits 48    Date for PT Re-Evaluation 08/08/22    PT Start Time [id number]    PT Stop Time 1059    PT Time Calculation (min) 44 min    Equipment Utilized During Treatment Gait belt    Activity Tolerance Patient tolerated treatment well;Patient limited by fatigue    Behavior During Therapy [facility] for tasks assessed/performed                          Past Medical History:  Diagnosis Date   Abdominal pain, right upper quadrant    Back pain    Calculus of kidney 12/09/2013   Chronic back pain    unspecified   Chronic left shoulder pain [id number]   Complication of anesthesia    Functional disorder of bladder    other   Galactorrhea 11/26/2014   Chronic    Hereditary and idiopathic neuropathy 08/19/2013   History of kidney stones    HPV test positive    Hypercholesteremia 08/19/2013   Hypertension    Incomplete bladder emptying    Microscopic hematuria    MS (multiple sclerosis) (HCC)    Muscle spasticity 05/21/2014   Nonspecific findings on examination of urine    other   Osteopenia    PONV (postoperative nausea and vomiting)    Status post laparoscopic supracervical hysterectomy 11/26/2014   Tobacco user 11/26/2014   Wrist fracture    Past Surgical History:  Procedure Laterality Date   bilateral tubal ligation  1996   BREAST CYST EXCISION Left 2002   CYST EXCISION Left 05/10/2022   Procedure: CYST REMOVAL;  Surgeon: Herbert Pun, MD;  Location: [facility];  Service: General;  Laterality: Left;   FRACTURE SURGERY     KNEE SURGERY     right   LAPAROSCOPIC SUPRACERVICAL HYSTERECTOMY  08/05/2013   ORIF WRIST FRACTURE Left 01/17/2017   Procedure: OPEN REDUCTION  INTERNAL FIXATION (ORIF) WRIST FRACTURE;  Surgeon: Lovell Sheehan, MD;  Location: [facility];  Service: Orthopedics;  Laterality: Left;   RADIOLOGY WITH ANESTHESIA N/A 03/18/2020   Procedure: MRI WITH ANESTHESIA CERVICAL SPINE AND BRAIN  WITH AND WITHOUT CONTRAST;  Surgeon: Radiologist, Medication, MD;  Location: [place];  Service: Radiology;  Laterality: N/A;   TUBAL LIGATION Bilateral    VAGINAL HYSTERECTOMY  03/2006   Patient Active Problem List   Diagnosis Date Noted   Abscess of left groin 11/15/2021   Abnormal LFTs 11/15/2021   Acute respiratory disease due to COVID-19 virus 11/21/2020   Weakness    Hypoalbuminemia due to protein-calorie malnutrition (HCC)    Neurogenic bowel    Neurogenic bladder    Labile blood pressure    Neuropathic pain    Abscess of female pelvis    SVT (supraventricular tachycardia)    Radial styloid tenosynovitis 03/12/2018   Wheelchair confinement 02/27/2018   Localized osteoporosis with current pathological fracture with routine healing 01/19/2017   Wrist fracture 01/16/2017   Sprain of ankle 03/23/2016   Closed fracture of lateral malleolus 03/16/2016   Health care maintenance 01/24/2016   Blood pressure elevated without history of HTN 10/25/2015   Essential hypertension 10/25/2015   Multiple  sclerosis ([place]) 10/02/2015   Chronic left shoulder pain 07/19/2015   Multiple sclerosis exacerbation ([place]) 07/14/2015   MS (multiple sclerosis) ([place]) 11/26/2014   Increased body mass index 11/26/2014   HPV test positive 11/26/2014   Status post laparoscopic supracervical hysterectomy 11/26/2014   Galactorrhea 11/26/2014   Back ache 05/21/2014   Adiposity 05/21/2014   Disordered sleep 05/21/2014   Muscle spasticity 05/21/2014   Spasticity 05/21/2014   Calculus of kidney [id number]   Renal colic [id number]   Hypercholesteremia 08/19/2013   Hereditary and idiopathic neuropathy 08/19/2013   Hypercholesterolemia without hypertriglyceridemia 08/19/2013    Bladder infection, chronic 07/25/2012   Disorder of bladder function 07/25/2012   Incomplete bladder emptying 07/25/2012   Microscopic hematuria 07/25/2012   Right upper quadrant pain 07/25/2012   ONSET DATE: 1995  REFERRING DIAG: MS  THERAPY DIAG:  Muscle weakness (generalized)  Difficulty in walking, not elsewhere classified  Unsteadiness on feet  Rationale for Evaluation and Treatment Rehabilitation  SUBJECTIVE:                                                                                                                                                                                          SUBJECTIVE STATEMENT: Patient reports she had some L arm tricep tightness.  .   Pt accompanied by: self  PERTINENT HISTORY: Patient is retuning to PT after hospitalization on 11/14/21 where she then went to SNF and then received home health therapy which stopped 02/08/22. Patient has weakness in BLE with RLE>LLE. She drives with hand controls. Patient has been diagnosed with MS in 1995.  PMH includes: back pain, CBP, chronic L shoulder pain, galactorrhea, neuropathy, HPV, hypercholestermeia, HTN, MS, osteopenia, PONV, wrist fracture. Started back driving about two weeks ago.   PAIN:  Are you having pain? Yes: NPRS scale: 2/10 Pain location: abdomen to R leg Pain description: spasm Aggravating factors: unknown Relieving factors: n/a  PRECAUTIONS: Fall  WEIGHT BEARING RESTRICTIONS No  FALLS: Has patient fallen in last 6 months? No  LIVING ENVIRONMENT: Lives with: lives with their family Lives in: House/apartment Stairs: Yes: Internal: full flight steps; but lives fully on first floor Has following equipment at home: Gilford Rile - 2 wheeled, Wheelchair (manual), shower chair, Grab bars, and Ramped entry  PLOF: Independent with household mobility with device  PATIENT GOALS be able to get into the bed without assistance, improve ambulation, standing tolerance, going up steps.    OBJECTIVE:   DIAGNOSTIC FINDINGS: MRI of the brain 03/18/2020 showed multiple T2/FLAIR hyperintense foci in the periventricular, juxtacortical and deep white matter.  There were no infratentorial lesions noted.  None of the foci enhanced.  Due to severe claustrophobia, the study was done with conscious sedation in the hospital  MRI of the cervical spine 03/18/2020 showed T2 hyperintense foci at C1-C2, C2-C3, C4-C5, C5-C6 and T1.  There is a disc protrusion at C5-C6 to the left and a larger disc protrusion at C6-C7 to the right that distorts the thecal sac.  The adjacent spinal cord has normal signal.  There could be compression of the right C7 nerve root.   LOWER EXTREMITY MMT:    MMT Right Eval Left Eval  Hip flexion 2- 2  Hip extension 2- 2  Hip abduction 2+ 2+  Hip adduction 2+ 2+  Knee flexion 2- 2  Knee extension 2 2  Ankle dorsiflexion trace trace  Ankle plantarflexion trace trace  (Blank rows = not tested)   TODAY'S TREATMENT:    Treatment: Ambulate 145f with bRW and wheelchair follow; one seated rest break after 70 ft. Cues for shifting weight and lifting limbs for foot clearance tolerated well.   Stand at white board: write on board x 3 minutes  Seated: Triceps stretch 30 seconds with overpressure  On dynadisc:  -static sit 60 seconds -lateral shift 10x each side -forward/backward 10x each LE   PATIENT EDUCATION: Education details: goals, POC, outcome measures Person educated: Patient Education method: Explanation, Demonstration, Tactile cues, and Verbal cues Education comprehension: verbalized understanding, returned demonstration, verbal cues required, and tactile cues required   HOME EXERCISE PROGRAM: Give next session  GOALS: Goals reviewed with patient? Yes  SHORT TERM GOALS: Target date: 06/27/2022    Patient will be independent in home exercise program to improve strength/mobility for better functional independence with ADLs. Baseline:11/9:  compliant 12/19: HEP compliant  Goal status: Partially Met  LONG TERM GOALS: Target date: 08/08/2022    Patient will increase FOTO score to equal to or greater than  52%   to demonstrate statistically significant improvement in mobility and quality of life Baseline: 10/10: 42% 11/9: 40% 12/19: 40% 1/9: 36% 2/1: 44%  Goal status: in progress  2.  Patient (> 779years old) will complete five times sit to stand test in < 15 seconds indicating an increased LE strength and improved balance. Baseline: 10/10: 38 seconds with one plop heavy UE support 11/9: 26 seconds with heavy BUE support 12/19: 37 seconds with BUE support 1/9: unable to perform due to weakness s/p procedure 1/11: 29.2 seconds  2/1: 27 seconds with BUE support  Goal status: In progress  3.  Patient will increase 10 meter walk test to >0.5 m/s as to improve gait speed for better community ambulation and to reduce fall risk. Baseline: 10/10: 54 seconds with bRW andw/c follow 11/9: 43.3 seconds with RW and w/c follow 12/19: 40.9 seconds with RW and w/c follow 1/9: unable to perform due to weakness s/p procedure 1/11: 47 seconds with RW and w/c follow 2/1: 42.58 seconds with RW and w/c follow  Goal status: In progress  4.  Patient will be able to get into bed independently with use of railing to increase independence with mobility and nighttime routine.  Baseline: 10/10: needs complete assistance to bring RLE into bed. 11/9: needs assistance with leg; 12/19: min a for RLE 2/1: min A for RLE getting into bed  Goal status: In progress  5.  Patient will go up 2 small steps to enter sisters house with min A.  Baseline: 10/10: unable to perform 11/9: unable to perform on 2 " step  12/18: unable to perform 1/9: unable to  perform due to weakness s/p procedure 2/1: unable to perform  Goal status: IN PROGRESS  6.  Patient will be able to transfer from standard chair heights and booths for community mobility.  Baseline: 1/9: unable 2/1: has not  had a chance to do yet Goal status: Ongoing.  ASSESSMENT:  CLINICAL IMPRESSION: Patient's triceps tightness is improved with stretching. Her ability to ambulate is improving with only one rest break at 70 ft. Patient is very fatigued by end of session with decreased core activation.  Patient will continue to benefit from skilled physical therapy intervention to address impairments, improve QOL, and attain therapy goals.    OBJECTIVE IMPAIRMENTS Abnormal gait, decreased activity tolerance, decreased balance, decreased coordination, decreased endurance, decreased mobility, difficulty walking, decreased ROM, decreased strength, hypomobility, increased edema, impaired perceived functional ability, increased muscle spasms, impaired flexibility, impaired tone, impaired UE functional use, improper body mechanics, postural dysfunction, obesity, and pain.   ACTIVITY LIMITATIONS carrying, lifting, bending, standing, squatting, sleeping, stairs, transfers, bed mobility, bathing, toileting, dressing, reach over head, locomotion level, and caring for others  PARTICIPATION LIMITATIONS: meal prep, cleaning, laundry, shopping, community activity, and yard work  PERSONAL FACTORS Age, Fitness, Past/current experiences, Sex, Social background, Time since onset of injury/illness/exacerbation, Transportation, and 3+ comorbidities: back pain, CBP, chronic L shoulder pain, galactorrhea, neuropathy, HPV, hypercholestermeia, HTN, MS, osteopenia, PONV, wrist fracture  are also affecting patient's functional outcome.   REHAB POTENTIAL: Fair    CLINICAL DECISION MAKING: Evolving/moderate complexity  EVALUATION COMPLEXITY: Moderate  PLAN: PT FREQUENCY: 2x/week  PT DURATION: 12 weeks  PLANNED INTERVENTIONS: Therapeutic exercises, Therapeutic activity, Neuromuscular re-education, Balance training, Gait training, Patient/Family education, Self Care, Joint mobilization, Stair training, Vestibular training, Canalith  repositioning, Visual/preceptual remediation/compensation, Orthotic/Fit training, DME instructions, Dry Needling, Electrical stimulation, Wheelchair mobility training, Spinal mobilization, Cryotherapy, Moist heat, Taping, Vasopneumatic device, Traction, Ultrasound, Ionotophoresis 37m/ml Dexamethasone, Manual therapy, and Re-evaluation  PLAN FOR NEXT SESSION: standing balance and strengthening, work on ambulation of decline and standing balance with decline.      MJanna Arch PT 06/20/2022, 12:33 PM

## 2022-06-20 ENCOUNTER — Ambulatory Visit: Payer: PPO

## 2022-06-20 DIAGNOSIS — R262 Difficulty in walking, not elsewhere classified: Secondary | ICD-10-CM

## 2022-06-20 DIAGNOSIS — M6281 Muscle weakness (generalized): Secondary | ICD-10-CM

## 2022-06-20 DIAGNOSIS — R2681 Unsteadiness on feet: Secondary | ICD-10-CM

## 2022-06-21 NOTE — Therapy (Unsigned)
OUTPATIENT PHYSICAL THERAPY NEURO TREATMENT  Patient Name: Lisa Williams MRN: [id number] DOB:[birth year], 62 y.o., female Today's Date: 06/22/2022  PCP: Kirk Ruths  REFERRING PROVIDER: Britt Bottom MD   PT End of Session - 06/22/22 1001     Visit Number 35    Number of Visits 48    Date for PT Re-Evaluation 08/08/22    PT Start Time [id number]    PT Stop Time 1058    PT Time Calculation (min) 43 min    Equipment Utilized During Treatment Gait belt    Activity Tolerance Patient tolerated treatment well;Patient limited by fatigue    Behavior During Therapy [facility] for tasks assessed/performed                           Past Medical History:  Diagnosis Date   Abdominal pain, right upper quadrant    Back pain    Calculus of kidney 12/09/2013   Chronic back pain    unspecified   Chronic left shoulder pain [id number]   Complication of anesthesia    Functional disorder of bladder    other   Galactorrhea 11/26/2014   Chronic    Hereditary and idiopathic neuropathy 08/19/2013   History of kidney stones    HPV test positive    Hypercholesteremia 08/19/2013   Hypertension    Incomplete bladder emptying    Microscopic hematuria    MS (multiple sclerosis) (HCC)    Muscle spasticity 05/21/2014   Nonspecific findings on examination of urine    other   Osteopenia    PONV (postoperative nausea and vomiting)    Status post laparoscopic supracervical hysterectomy 11/26/2014   Tobacco user 11/26/2014   Wrist fracture    Past Surgical History:  Procedure Laterality Date   bilateral tubal ligation  1996   BREAST CYST EXCISION Left 2002   CYST EXCISION Left 05/10/2022   Procedure: CYST REMOVAL;  Surgeon: Herbert Pun, MD;  Location: [facility];  Service: General;  Laterality: Left;   FRACTURE SURGERY     KNEE SURGERY     right   LAPAROSCOPIC SUPRACERVICAL HYSTERECTOMY  08/05/2013   ORIF WRIST FRACTURE Left 01/17/2017   Procedure: OPEN  REDUCTION INTERNAL FIXATION (ORIF) WRIST FRACTURE;  Surgeon: Lovell Sheehan, MD;  Location: [facility];  Service: Orthopedics;  Laterality: Left;   RADIOLOGY WITH ANESTHESIA N/A 03/18/2020   Procedure: MRI WITH ANESTHESIA CERVICAL SPINE AND BRAIN  WITH AND WITHOUT CONTRAST;  Surgeon: Radiologist, Medication, MD;  Location: [place];  Service: Radiology;  Laterality: N/A;   TUBAL LIGATION Bilateral    VAGINAL HYSTERECTOMY  03/2006   Patient Active Problem List   Diagnosis Date Noted   Abscess of left groin 11/15/2021   Abnormal LFTs 11/15/2021   Acute respiratory disease due to COVID-19 virus 11/21/2020   Weakness    Hypoalbuminemia due to protein-calorie malnutrition [facility])    Neurogenic bowel    Neurogenic bladder    Labile blood pressure    Neuropathic pain    Abscess of female pelvis    SVT (supraventricular tachycardia)    Radial styloid tenosynovitis 03/12/2018   Wheelchair confinement 02/27/2018   Localized osteoporosis with current pathological fracture with routine healing 01/19/2017   Wrist fracture 01/16/2017   Sprain of ankle 03/23/2016   Closed fracture of lateral malleolus 03/16/2016   Health care maintenance 01/24/2016   Blood pressure elevated without history of HTN 10/25/2015   Essential hypertension 10/25/2015  Multiple sclerosis ([place]) 10/02/2015   Chronic left shoulder pain 07/19/2015   Multiple sclerosis exacerbation ([place]) 07/14/2015   MS (multiple sclerosis) ([place]) 11/26/2014   Increased body mass index 11/26/2014   HPV test positive 11/26/2014   Status post laparoscopic supracervical hysterectomy 11/26/2014   Galactorrhea 11/26/2014   Back ache 05/21/2014   Adiposity 05/21/2014   Disordered sleep 05/21/2014   Muscle spasticity 05/21/2014   Spasticity 05/21/2014   Calculus of kidney [id number]   Renal colic [id number]   Hypercholesteremia 08/19/2013   Hereditary and idiopathic neuropathy 08/19/2013   Hypercholesterolemia without hypertriglyceridemia  08/19/2013   Bladder infection, chronic 07/25/2012   Disorder of bladder function 07/25/2012   Incomplete bladder emptying 07/25/2012   Microscopic hematuria 07/25/2012   Right upper quadrant pain 07/25/2012   ONSET DATE: 1995  REFERRING DIAG: MS  THERAPY DIAG:  Muscle weakness (generalized)  Difficulty in walking, not elsewhere classified  Unsteadiness on feet  Rationale for Evaluation and Treatment Rehabilitation  SUBJECTIVE:                                                                                                                                                                                          SUBJECTIVE STATEMENT: Patient reports she is feeling well. No falls or LOB since last session.   .   Pt accompanied by: self  PERTINENT HISTORY: Patient is retuning to PT after hospitalization on 11/14/21 where she then went to SNF and then received home health therapy which stopped 02/08/22. Patient has weakness in BLE with RLE>LLE. She drives with hand controls. Patient has been diagnosed with MS in 1995.  PMH includes: back pain, CBP, chronic L shoulder pain, galactorrhea, neuropathy, HPV, hypercholestermeia, HTN, MS, osteopenia, PONV, wrist fracture. Started back driving about two weeks ago.   PAIN:  Are you having pain? Yes: NPRS scale: 2/10 Pain location: abdomen to R leg Pain description: spasm Aggravating factors: unknown Relieving factors: n/a  PRECAUTIONS: Fall  WEIGHT BEARING RESTRICTIONS No  FALLS: Has patient fallen in last 6 months? No  LIVING ENVIRONMENT: Lives with: lives with their family Lives in: House/apartment Stairs: Yes: Internal: full flight steps; but lives fully on first floor Has following equipment at home: Gilford Rile - 2 wheeled, Wheelchair (manual), shower chair, Grab bars, and Ramped entry  PLOF: Independent with household mobility with device  PATIENT GOALS be able to get into the bed without assistance, improve ambulation, standing  tolerance, going up steps.   OBJECTIVE:   DIAGNOSTIC FINDINGS: MRI of the brain 03/18/2020 showed multiple T2/FLAIR hyperintense foci in the periventricular, juxtacortical and deep white matter.  There were no infratentorial lesions noted.  None of the foci enhanced.   Due to severe claustrophobia, the study was done with conscious sedation in the hospital  MRI of the cervical spine 03/18/2020 showed T2 hyperintense foci at C1-C2, C2-C3, C4-C5, C5-C6 and T1.  There is a disc protrusion at C5-C6 to the left and a larger disc protrusion at C6-C7 to the right that distorts the thecal sac.  The adjacent spinal cord has normal signal.  There could be compression of the right C7 nerve root.   LOWER EXTREMITY MMT:    MMT Right Eval Left Eval  Hip flexion 2- 2  Hip extension 2- 2  Hip abduction 2+ 2+  Hip adduction 2+ 2+  Knee flexion 2- 2  Knee extension 2 2  Ankle dorsiflexion trace trace  Ankle plantarflexion trace trace  (Blank rows = not tested)   TODAY'S TREATMENT:    Treatment: Ambulate 25 minutes with bRW and wheelchair follow; two seated rest break after 60 ft. Cues for shifting weight and lifting limbs for foot clearance tolerated well.   At matrix machine:  -Row #25.5 12x; 2 sets -lat pulldown single arm #17.5 12x each UE -bent over row #25.5 2 sets of 12x each   PATIENT EDUCATION: Education details: goals, POC, outcome measures Person educated: Patient Education method: Explanation, Demonstration, Tactile cues, and Verbal cues Education comprehension: verbalized understanding, returned demonstration, verbal cues required, and tactile cues required   HOME EXERCISE PROGRAM: Give next session  GOALS: Goals reviewed with patient? Yes  SHORT TERM GOALS: Target date: 06/27/2022    Patient will be independent in home exercise program to improve strength/mobility for better functional independence with ADLs. Baseline:11/9: compliant 12/19: HEP compliant  Goal status:  Partially Met  LONG TERM GOALS: Target date: 08/08/2022    Patient will increase FOTO score to equal to or greater than  52%   to demonstrate statistically significant improvement in mobility and quality of life Baseline: 10/10: 42% 11/9: 40% 12/19: 40% 1/9: 36% 2/1: 44%  Goal status: in progress  2.  Patient (> 1 years old) will complete five times sit to stand test in < 15 seconds indicating an increased LE strength and improved balance. Baseline: 10/10: 38 seconds with one plop heavy UE support 11/9: 26 seconds with heavy BUE support 12/19: 37 seconds with BUE support 1/9: unable to perform due to weakness s/p procedure 1/11: 29.2 seconds  2/1: 27 seconds with BUE support  Goal status: In progress  3.  Patient will increase 10 meter walk test to >0.5 m/s as to improve gait speed for better community ambulation and to reduce fall risk. Baseline: 10/10: 54 seconds with bRW andw/c follow 11/9: 43.3 seconds with RW and w/c follow 12/19: 40.9 seconds with RW and w/c follow 1/9: unable to perform due to weakness s/p procedure 1/11: 47 seconds with RW and w/c follow 2/1: 42.58 seconds with RW and w/c follow  Goal status: In progress  4.  Patient will be able to get into bed independently with use of railing to increase independence with mobility and nighttime routine.  Baseline: 10/10: needs complete assistance to bring RLE into bed. 11/9: needs assistance with leg; 12/19: min a for RLE 2/1: min A for RLE getting into bed  Goal status: In progress  5.  Patient will go up 2 small steps to enter sisters house with min A.  Baseline: 10/10: unable to perform 11/9: unable to perform on 2 " step  12/18: unable to perform 1/9: unable to perform  due to weakness s/p procedure 2/1: unable to perform  Goal status: IN PROGRESS  6.  Patient will be able to transfer from standard chair heights and booths for community mobility.  Baseline: 1/9: unable 2/1: has not had a chance to do yet Goal status: Ongoing.   ASSESSMENT:  CLINICAL IMPRESSION: Patient presents with excellent motivation. She does require two seated rest breaks with ambulation today but is able to ambulate for increased time this session. Changing surfaces tolerated well with no instability during ambulation. Patient tolerates seated postural strengthening interventions with fatigue noted.  Patient will continue to benefit from skilled physical therapy intervention to address impairments, improve QOL, and attain therapy goals.    OBJECTIVE IMPAIRMENTS Abnormal gait, decreased activity tolerance, decreased balance, decreased coordination, decreased endurance, decreased mobility, difficulty walking, decreased ROM, decreased strength, hypomobility, increased edema, impaired perceived functional ability, increased muscle spasms, impaired flexibility, impaired tone, impaired UE functional use, improper body mechanics, postural dysfunction, obesity, and pain.   ACTIVITY LIMITATIONS carrying, lifting, bending, standing, squatting, sleeping, stairs, transfers, bed mobility, bathing, toileting, dressing, reach over head, locomotion level, and caring for others  PARTICIPATION LIMITATIONS: meal prep, cleaning, laundry, shopping, community activity, and yard work  PERSONAL FACTORS Age, Fitness, Past/current experiences, Sex, Social background, Time since onset of injury/illness/exacerbation, Transportation, and 3+ comorbidities: back pain, CBP, chronic L shoulder pain, galactorrhea, neuropathy, HPV, hypercholestermeia, HTN, MS, osteopenia, PONV, wrist fracture  are also affecting patient's functional outcome.   REHAB POTENTIAL: Fair    CLINICAL DECISION MAKING: Evolving/moderate complexity  EVALUATION COMPLEXITY: Moderate  PLAN: PT FREQUENCY: 2x/week  PT DURATION: 12 weeks  PLANNED INTERVENTIONS: Therapeutic exercises, Therapeutic activity, Neuromuscular re-education, Balance training, Gait training, Patient/Family education, Self Care, Joint  mobilization, Stair training, Vestibular training, Canalith repositioning, Visual/preceptual remediation/compensation, Orthotic/Fit training, DME instructions, Dry Needling, Electrical stimulation, Wheelchair mobility training, Spinal mobilization, Cryotherapy, Moist heat, Taping, Vasopneumatic device, Traction, Ultrasound, Ionotophoresis 2m/ml Dexamethasone, Manual therapy, and Re-evaluation  PLAN FOR NEXT SESSION: standing balance and strengthening, work on ambulation of decline and standing balance with decline.      MJanna Arch PT 06/22/2022, 11:05 AM

## 2022-06-22 ENCOUNTER — Ambulatory Visit: Payer: PPO

## 2022-06-22 DIAGNOSIS — R262 Difficulty in walking, not elsewhere classified: Secondary | ICD-10-CM

## 2022-06-22 DIAGNOSIS — M6281 Muscle weakness (generalized): Secondary | ICD-10-CM

## 2022-06-22 DIAGNOSIS — R2681 Unsteadiness on feet: Secondary | ICD-10-CM

## 2022-06-27 ENCOUNTER — Ambulatory Visit: Payer: PPO

## 2022-06-27 DIAGNOSIS — M6281 Muscle weakness (generalized): Secondary | ICD-10-CM

## 2022-06-27 DIAGNOSIS — R2681 Unsteadiness on feet: Secondary | ICD-10-CM

## 2022-06-27 DIAGNOSIS — R262 Difficulty in walking, not elsewhere classified: Secondary | ICD-10-CM

## 2022-06-27 NOTE — Therapy (Signed)
OUTPATIENT PHYSICAL THERAPY NEURO TREATMENT  Patient Name: Lisa Williams MRN: [id number] DOB:10-03-1960, 62 y.o., female Today's Date: 06/27/2022  PCP: Kirk Ruths  REFERRING PROVIDER: Britt Bottom MD   PT End of Session - 06/27/22 0949     Visit Number 36    Number of Visits 48    Date for PT Re-Evaluation 08/08/22    PT Start Time [id number]    PT Stop Time 1059    PT Time Calculation (min) 44 min    Equipment Utilized During Treatment Gait belt    Activity Tolerance Patient tolerated treatment well;Patient limited by fatigue    Behavior During Therapy [facility] for tasks assessed/performed                            Past Medical History:  Diagnosis Date   Abdominal pain, right upper quadrant    Back pain    Calculus of kidney 12/09/2013   Chronic back pain    unspecified   Chronic left shoulder pain [id number]   Complication of anesthesia    Functional disorder of bladder    other   Galactorrhea 11/26/2014   Chronic    Hereditary and idiopathic neuropathy 08/19/2013   History of kidney stones    HPV test positive    Hypercholesteremia 08/19/2013   Hypertension    Incomplete bladder emptying    Microscopic hematuria    MS (multiple sclerosis) (HCC)    Muscle spasticity 05/21/2014   Nonspecific findings on examination of urine    other   Osteopenia    PONV (postoperative nausea and vomiting)    Status post laparoscopic supracervical hysterectomy 11/26/2014   Tobacco user 11/26/2014   Wrist fracture    Past Surgical History:  Procedure Laterality Date   bilateral tubal ligation  1996   BREAST CYST EXCISION Left 2002   CYST EXCISION Left 05/10/2022   Procedure: CYST REMOVAL;  Surgeon: Herbert Pun, MD;  Location: [facility];  Service: General;  Laterality: Left;   FRACTURE SURGERY     KNEE SURGERY     right   LAPAROSCOPIC SUPRACERVICAL HYSTERECTOMY  08/05/2013   ORIF WRIST FRACTURE Left 01/17/2017   Procedure: OPEN  REDUCTION INTERNAL FIXATION (ORIF) WRIST FRACTURE;  Surgeon: Lovell Sheehan, MD;  Location: [facility];  Service: Orthopedics;  Laterality: Left;   RADIOLOGY WITH ANESTHESIA N/A 03/18/2020   Procedure: MRI WITH ANESTHESIA CERVICAL SPINE AND BRAIN  WITH AND WITHOUT CONTRAST;  Surgeon: Radiologist, Medication, MD;  Location: [place];  Service: Radiology;  Laterality: N/A;   TUBAL LIGATION Bilateral    VAGINAL HYSTERECTOMY  03/2006   Patient Active Problem List   Diagnosis Date Noted   Abscess of left groin 11/15/2021   Abnormal LFTs 11/15/2021   Acute respiratory disease due to COVID-19 virus 11/21/2020   Weakness    Hypoalbuminemia due to protein-calorie malnutrition [facility])    Neurogenic bowel    Neurogenic bladder    Labile blood pressure    Neuropathic pain    Abscess of female pelvis    SVT (supraventricular tachycardia)    Radial styloid tenosynovitis 03/12/2018   Wheelchair confinement 02/27/2018   Localized osteoporosis with current pathological fracture with routine healing 01/19/2017   Wrist fracture 01/16/2017   Sprain of ankle 03/23/2016   Closed fracture of lateral malleolus 03/16/2016   Health care maintenance 01/24/2016   Blood pressure elevated without history of HTN 10/25/2015   Essential hypertension 10/25/2015  Multiple sclerosis ([place]) 10/02/2015   Chronic left shoulder pain 07/19/2015   Multiple sclerosis exacerbation ([place]) 07/14/2015   MS (multiple sclerosis) ([place]) 11/26/2014   Increased body mass index 11/26/2014   HPV test positive 11/26/2014   Status post laparoscopic supracervical hysterectomy 11/26/2014   Galactorrhea 11/26/2014   Back ache 05/21/2014   Adiposity 05/21/2014   Disordered sleep 05/21/2014   Muscle spasticity 05/21/2014   Spasticity 05/21/2014   Calculus of kidney [id number]   Renal colic [id number]   Hypercholesteremia 08/19/2013   Hereditary and idiopathic neuropathy 08/19/2013   Hypercholesterolemia without hypertriglyceridemia  08/19/2013   Bladder infection, chronic 07/25/2012   Disorder of bladder function 07/25/2012   Incomplete bladder emptying 07/25/2012   Microscopic hematuria 07/25/2012   Right upper quadrant pain 07/25/2012   ONSET DATE: 1995  REFERRING DIAG: MS  THERAPY DIAG:  Muscle weakness (generalized)  Difficulty in walking, not elsewhere classified  Unsteadiness on feet  Rationale for Evaluation and Treatment Rehabilitation  SUBJECTIVE:                                                                                                                                                                                          SUBJECTIVE STATEMENT: Patient reports she is feeling good today. No falls or LOB since last session.  .   Pt accompanied by: self  PERTINENT HISTORY: Patient is retuning to PT after hospitalization on 11/14/21 where she then went to SNF and then received home health therapy which stopped 02/08/22. Patient has weakness in BLE with RLE>LLE. She drives with hand controls. Patient has been diagnosed with MS in 1995.  PMH includes: back pain, CBP, chronic L shoulder pain, galactorrhea, neuropathy, HPV, hypercholestermeia, HTN, MS, osteopenia, PONV, wrist fracture. Started back driving about two weeks ago.   PAIN:  Are you having pain? Yes: NPRS scale: 2/10 Pain location: abdomen to R leg Pain description: spasm Aggravating factors: unknown Relieving factors: n/a  PRECAUTIONS: Fall  WEIGHT BEARING RESTRICTIONS No  FALLS: Has patient fallen in last 6 months? No  LIVING ENVIRONMENT: Lives with: lives with their family Lives in: House/apartment Stairs: Yes: Internal: full flight steps; but lives fully on first floor Has following equipment at home: Gilford Rile - 2 wheeled, Wheelchair (manual), shower chair, Grab bars, and Ramped entry  PLOF: Independent with household mobility with device  PATIENT GOALS be able to get into the bed without assistance, improve ambulation,  standing tolerance, going up steps.   OBJECTIVE:   DIAGNOSTIC FINDINGS: MRI of the brain 03/18/2020 showed multiple T2/FLAIR hyperintense foci in the periventricular, juxtacortical and deep white matter.  There were no infratentorial lesions noted.  None of the foci enhanced.   Due to severe claustrophobia, the study was done with conscious sedation in the hospital  MRI of the cervical spine 03/18/2020 showed T2 hyperintense foci at C1-C2, C2-C3, C4-C5, C5-C6 and T1.  There is a disc protrusion at C5-C6 to the left and a larger disc protrusion at C6-C7 to the right that distorts the thecal sac.  The adjacent spinal cord has normal signal.  There could be compression of the right C7 nerve root.   LOWER EXTREMITY MMT:    MMT Right Eval Left Eval  Hip flexion 2- 2  Hip extension 2- 2  Hip abduction 2+ 2+  Hip adduction 2+ 2+  Knee flexion 2- 2  Knee extension 2 2  Ankle dorsiflexion trace trace  Ankle plantarflexion trace trace  (Blank rows = not tested)   TODAY'S TREATMENT:    Treatment: Ambulate 90 ft, seated rest break then ambulate another 60 ft. Cues for shifting weight and lifting limbs for foot clearance tolerated well.   Dynadisc; -seated on dynadisc: -static sit 60 seconds -lateral weight shift 10x -forward/backward weight shift 10x -rotation 10x -clap hands for pertubation and coordination x2 minutes -hamstring isometric 10x pf, 10x df each LE   PATIENT EDUCATION: Education details: goals, POC, outcome measures Person educated: Patient Education method: Explanation, Demonstration, Tactile cues, and Verbal cues Education comprehension: verbalized understanding, returned demonstration, verbal cues required, and tactile cues required   HOME EXERCISE PROGRAM: Give next session  GOALS: Goals reviewed with patient? Yes  SHORT TERM GOALS: Target date: 06/27/2022    Patient will be independent in home exercise program to improve strength/mobility for better  functional independence with ADLs. Baseline:11/9: compliant 12/19: HEP compliant  Goal status: Partially Met  LONG TERM GOALS: Target date: 08/08/2022    Patient will increase FOTO score to equal to or greater than  52%   to demonstrate statistically significant improvement in mobility and quality of life Baseline: 10/10: 42% 11/9: 40% 12/19: 40% 1/9: 36% 2/1: 44%  Goal status: in progress  2.  Patient (> 56 years old) will complete five times sit to stand test in < 15 seconds indicating an increased LE strength and improved balance. Baseline: 10/10: 38 seconds with one plop heavy UE support 11/9: 26 seconds with heavy BUE support 12/19: 37 seconds with BUE support 1/9: unable to perform due to weakness s/p procedure 1/11: 29.2 seconds  2/1: 27 seconds with BUE support  Goal status: In progress  3.  Patient will increase 10 meter walk test to >0.5 m/s as to improve gait speed for better community ambulation and to reduce fall risk. Baseline: 10/10: 54 seconds with bRW andw/c follow 11/9: 43.3 seconds with RW and w/c follow 12/19: 40.9 seconds with RW and w/c follow 1/9: unable to perform due to weakness s/p procedure 1/11: 47 seconds with RW and w/c follow 2/1: 42.58 seconds with RW and w/c follow  Goal status: In progress  4.  Patient will be able to get into bed independently with use of railing to increase independence with mobility and nighttime routine.  Baseline: 10/10: needs complete assistance to bring RLE into bed. 11/9: needs assistance with leg; 12/19: min a for RLE 2/1: min A for RLE getting into bed  Goal status: In progress  5.  Patient will go up 2 small steps to enter sisters house with min A.  Baseline: 10/10: unable to perform 11/9: unable to perform on 2 " step  12/18: unable to perform  1/9: unable to perform due to weakness s/p procedure 2/1: unable to perform  Goal status: IN PROGRESS  6.  Patient will be able to transfer from standard chair heights and booths for  community mobility.  Baseline: 1/9: unable 2/1: has not had a chance to do yet Goal status: Ongoing.  ASSESSMENT:  CLINICAL IMPRESSION: Patient is able to ambulate 90 ft for first trial of ambulation without stopping demonstrating improved capacity for ambulation. She is able to ambulate again after period of rest break. Patient's core stabilization is improving with decreased need for assistance and rest beaks with dynadisc activities.  Patient will continue to benefit from skilled physical therapy intervention to address impairments, improve QOL, and attain therapy goals.    OBJECTIVE IMPAIRMENTS Abnormal gait, decreased activity tolerance, decreased balance, decreased coordination, decreased endurance, decreased mobility, difficulty walking, decreased ROM, decreased strength, hypomobility, increased edema, impaired perceived functional ability, increased muscle spasms, impaired flexibility, impaired tone, impaired UE functional use, improper body mechanics, postural dysfunction, obesity, and pain.   ACTIVITY LIMITATIONS carrying, lifting, bending, standing, squatting, sleeping, stairs, transfers, bed mobility, bathing, toileting, dressing, reach over head, locomotion level, and caring for others  PARTICIPATION LIMITATIONS: meal prep, cleaning, laundry, shopping, community activity, and yard work  PERSONAL FACTORS Age, Fitness, Past/current experiences, Sex, Social background, Time since onset of injury/illness/exacerbation, Transportation, and 3+ comorbidities: back pain, CBP, chronic L shoulder pain, galactorrhea, neuropathy, HPV, hypercholestermeia, HTN, MS, osteopenia, PONV, wrist fracture  are also affecting patient's functional outcome.   REHAB POTENTIAL: Fair    CLINICAL DECISION MAKING: Evolving/moderate complexity  EVALUATION COMPLEXITY: Moderate  PLAN: PT FREQUENCY: 2x/week  PT DURATION: 12 weeks  PLANNED INTERVENTIONS: Therapeutic exercises, Therapeutic activity, Neuromuscular  re-education, Balance training, Gait training, Patient/Family education, Self Care, Joint mobilization, Stair training, Vestibular training, Canalith repositioning, Visual/preceptual remediation/compensation, Orthotic/Fit training, DME instructions, Dry Needling, Electrical stimulation, Wheelchair mobility training, Spinal mobilization, Cryotherapy, Moist heat, Taping, Vasopneumatic device, Traction, Ultrasound, Ionotophoresis 73m/ml Dexamethasone, Manual therapy, and Re-evaluation  PLAN FOR NEXT SESSION: standing balance and strengthening, work on ambulation of decline and standing balance with decline.      MJanna Arch PT 06/27/2022, 12:12 PM

## 2022-06-28 NOTE — Therapy (Signed)
OUTPATIENT PHYSICAL THERAPY NEURO TREATMENT  Patient Name: Lisa Williams MRN: [id number] DOB:[birth year], 62 y.o., female Today's Date: 06/29/2022  PCP: Kirk Ruths  REFERRING PROVIDER: Britt Bottom MD   PT End of Session - 06/29/22 0957     Visit Number 37    Number of Visits 48    Date for PT Re-Evaluation 08/08/22    PT Start Time [id number]    PT Stop Time 1059    PT Time Calculation (min) 44 min    Equipment Utilized During Treatment Gait belt    Activity Tolerance Patient tolerated treatment well;Patient limited by fatigue    Behavior During Therapy [facility] for tasks assessed/performed                             Past Medical History:  Diagnosis Date   Abdominal pain, right upper quadrant    Back pain    Calculus of kidney 12/09/2013   Chronic back pain    unspecified   Chronic left shoulder pain [id number]   Complication of anesthesia    Functional disorder of bladder    other   Galactorrhea 11/26/2014   Chronic    Hereditary and idiopathic neuropathy 08/19/2013   History of kidney stones    HPV test positive    Hypercholesteremia 08/19/2013   Hypertension    Incomplete bladder emptying    Microscopic hematuria    MS (multiple sclerosis) (HCC)    Muscle spasticity 05/21/2014   Nonspecific findings on examination of urine    other   Osteopenia    PONV (postoperative nausea and vomiting)    Status post laparoscopic supracervical hysterectomy 11/26/2014   Tobacco user 11/26/2014   Wrist fracture    Past Surgical History:  Procedure Laterality Date   bilateral tubal ligation  1996   BREAST CYST EXCISION Left 2002   CYST EXCISION Left 05/10/2022   Procedure: CYST REMOVAL;  Surgeon: Herbert Pun, MD;  Location: [facility];  Service: General;  Laterality: Left;   FRACTURE SURGERY     KNEE SURGERY     right   LAPAROSCOPIC SUPRACERVICAL HYSTERECTOMY  08/05/2013   ORIF WRIST FRACTURE Left 01/17/2017   Procedure: OPEN  REDUCTION INTERNAL FIXATION (ORIF) WRIST FRACTURE;  Surgeon: Lovell Sheehan, MD;  Location: [facility];  Service: Orthopedics;  Laterality: Left;   RADIOLOGY WITH ANESTHESIA N/A 03/18/2020   Procedure: MRI WITH ANESTHESIA CERVICAL SPINE AND BRAIN  WITH AND WITHOUT CONTRAST;  Surgeon: Radiologist, Medication, MD;  Location: [facility];  Service: Radiology;  Laterality: N/A;   TUBAL LIGATION Bilateral    VAGINAL HYSTERECTOMY  03/2006   Patient Active Problem List   Diagnosis Date Noted   Abscess of left groin 11/15/2021   Abnormal LFTs 11/15/2021   Acute respiratory disease due to COVID-19 virus 11/21/2020   Weakness    Hypoalbuminemia due to protein-calorie malnutrition [facility])    Neurogenic bowel    Neurogenic bladder    Labile blood pressure    Neuropathic pain    Abscess of female pelvis    SVT (supraventricular tachycardia)    Radial styloid tenosynovitis 03/12/2018   Wheelchair confinement 02/27/2018   Localized osteoporosis with current pathological fracture with routine healing 01/19/2017   Wrist fracture 01/16/2017   Sprain of ankle 03/23/2016   Closed fracture of lateral malleolus 03/16/2016   Health care maintenance 01/24/2016   Blood pressure elevated without history of HTN 10/25/2015   Essential hypertension 10/25/2015  Multiple sclerosis ([place]) 10/02/2015   Chronic left shoulder pain 07/19/2015   Multiple sclerosis exacerbation ([place]) 07/14/2015   MS (multiple sclerosis) ([place]) 11/26/2014   Increased body mass index 11/26/2014   HPV test positive 11/26/2014   Status post laparoscopic supracervical hysterectomy 11/26/2014   Galactorrhea 11/26/2014   Back ache 05/21/2014   Adiposity 05/21/2014   Disordered sleep 05/21/2014   Muscle spasticity 05/21/2014   Spasticity 05/21/2014   Calculus of kidney [id number]   Renal colic [id number]   Hypercholesteremia 08/19/2013   Hereditary and idiopathic neuropathy 08/19/2013   Hypercholesterolemia without hypertriglyceridemia  08/19/2013   Bladder infection, chronic 07/25/2012   Disorder of bladder function 07/25/2012   Incomplete bladder emptying 07/25/2012   Microscopic hematuria 07/25/2012   Right upper quadrant pain 07/25/2012   ONSET DATE: 1995  REFERRING DIAG: MS  THERAPY DIAG:  Muscle weakness (generalized)  Difficulty in walking, not elsewhere classified  Unsteadiness on feet  Abnormality of gait and mobility  Rationale for Evaluation and Treatment Rehabilitation  SUBJECTIVE:                                                                                                                                                                                          SUBJECTIVE STATEMENT: Patient presents with good motivation. Feels good today.   .   Pt accompanied by: self  PERTINENT HISTORY: Patient is retuning to PT after hospitalization on 11/14/21 where she then went to SNF and then received home health therapy which stopped 02/08/22. Patient has weakness in BLE with RLE>LLE. She drives with hand controls. Patient has been diagnosed with MS in 1995.  PMH includes: back pain, CBP, chronic L shoulder pain, galactorrhea, neuropathy, HPV, hypercholestermeia, HTN, MS, osteopenia, PONV, wrist fracture. Started back driving about two weeks ago.   PAIN:  Are you having pain? Yes: NPRS scale: 2/10 Pain location: abdomen to R leg Pain description: spasm Aggravating factors: unknown Relieving factors: n/a  PRECAUTIONS: Fall  WEIGHT BEARING RESTRICTIONS No  FALLS: Has patient fallen in last 6 months? No  LIVING ENVIRONMENT: Lives with: lives with their family Lives in: House/apartment Stairs: Yes: Internal: full flight steps; but lives fully on first floor Has following equipment at home: Gilford Rile - 2 wheeled, Wheelchair (manual), shower chair, Grab bars, and Ramped entry  PLOF: Independent with household mobility with device  PATIENT GOALS be able to get into the bed without assistance, improve  ambulation, standing tolerance, going up steps.   OBJECTIVE:   DIAGNOSTIC FINDINGS: MRI of the brain 03/18/2020 showed multiple T2/FLAIR hyperintense foci in the periventricular, juxtacortical and deep white matter.  There were no infratentorial lesions noted.  None of the foci enhanced.   Due to severe claustrophobia, the study was done with conscious sedation in the hospital  MRI of the cervical spine 03/18/2020 showed T2 hyperintense foci at C1-C2, C2-C3, C4-C5, C5-C6 and T1.  There is a disc protrusion at C5-C6 to the left and a larger disc protrusion at C6-C7 to the right that distorts the thecal sac.  The adjacent spinal cord has normal signal.  There could be compression of the right C7 nerve root.   LOWER EXTREMITY MMT:    MMT Right Eval Left Eval  Hip flexion 2- 2  Hip extension 2- 2  Hip abduction 2+ 2+  Hip adduction 2+ 2+  Knee flexion 2- 2  Knee extension 2 2  Ankle dorsiflexion trace trace  Ankle plantarflexion trace trace  (Blank rows = not tested)   TODAY'S TREATMENT:    Treatment: Ambulate 70 ft, seated rest break then ambulate another 65 ft. Cues for shifting weight and lifting limbs for foot clearance tolerated well.   Activity Description: red=right hand, green =left hand Activity Setting:  The Blaze Pod Random setting was chosen to enhance cognitive processing and agility, providing an unpredictable environment to simulate real-world scenarios, and fostering quick reactions and adaptability.   Number of Pods:  5 Cycles/Sets:  3 Duration (Time or Hit Count):  20  Activity Description: hit right with red left with green with varying changing hits and position of pods Activity Setting:  The Blaze Pod Random setting was chosen to enhance cognitive processing and agility, providing an unpredictable environment to simulate real-world scenarios, and fostering quick reactions and adaptability.   Number of Pods:  2 Cycles/Sets:  3 Duration (Time or Hit Count):   20      PATIENT EDUCATION: Education details: goals, POC, outcome measures Person educated: Patient Education method: Explanation, Demonstration, Tactile cues, and Verbal cues Education comprehension: verbalized understanding, returned demonstration, verbal cues required, and tactile cues required   HOME EXERCISE PROGRAM: Give next session  GOALS: Goals reviewed with patient? Yes  SHORT TERM GOALS: Target date: 06/27/2022    Patient will be independent in home exercise program to improve strength/mobility for better functional independence with ADLs. Baseline:11/9: compliant 12/19: HEP compliant  Goal status: Partially Met  LONG TERM GOALS: Target date: 08/08/2022    Patient will increase FOTO score to equal to or greater than  52%   to demonstrate statistically significant improvement in mobility and quality of life Baseline: 10/10: 42% 11/9: 40% 12/19: 40% 1/9: 36% 2/1: 44%  Goal status: in progress  2.  Patient (> 41 years old) will complete five times sit to stand test in < 15 seconds indicating an increased LE strength and improved balance. Baseline: 10/10: 38 seconds with one plop heavy UE support 11/9: 26 seconds with heavy BUE support 12/19: 37 seconds with BUE support 1/9: unable to perform due to weakness s/p procedure 1/11: 29.2 seconds  2/1: 27 seconds with BUE support  Goal status: In progress  3.  Patient will increase 10 meter walk test to >0.5 m/s as to improve gait speed for better community ambulation and to reduce fall risk. Baseline: 10/10: 54 seconds with bRW andw/c follow 11/9: 43.3 seconds with RW and w/c follow 12/19: 40.9 seconds with RW and w/c follow 1/9: unable to perform due to weakness s/p procedure 1/11: 47 seconds with RW and w/c follow 2/1: 42.58 seconds with RW and w/c follow  Goal status: In progress  4.  Patient will be  able to get into bed independently with use of railing to increase independence with mobility and nighttime routine.   Baseline: 10/10: needs complete assistance to bring RLE into bed. 11/9: needs assistance with leg; 12/19: min a for RLE 2/1: min A for RLE getting into bed  Goal status: In progress  5.  Patient will go up 2 small steps to enter sisters house with min A.  Baseline: 10/10: unable to perform 11/9: unable to perform on 2 " step  12/18: unable to perform 1/9: unable to perform due to weakness s/p procedure 2/1: unable to perform  Goal status: IN PROGRESS  6.  Patient will be able to transfer from standard chair heights and booths for community mobility.  Baseline: 1/9: unable 2/1: has not had a chance to do yet Goal status: Ongoing.  ASSESSMENT:  CLINICAL IMPRESSION: Patient presents with excellent motivation. She is able to tolerate dynamic coordination tasks with blaze pods. The patient demonstrated significant progress while utilizing [employer], showcasing improved coordination, balance, and cognitive function. The incorporation of dual-tasking technology with color recognition and association with specific movements in Blaze Pods was strategically chosen to provide a dynamic training environment, enabling the patient to engage in simultaneous physical and cognitive tasks. This unique approach enhances not only their physical abilities but also fosters increased neural connectivity and mental awareness, contributing to a well-rounded and effective rehabilitation and training experience.   Patient will continue to benefit from skilled physical therapy intervention to address impairments, improve QOL, and attain therapy goals.    OBJECTIVE IMPAIRMENTS Abnormal gait, decreased activity tolerance, decreased balance, decreased coordination, decreased endurance, decreased mobility, difficulty walking, decreased ROM, decreased strength, hypomobility, increased edema, impaired perceived functional ability, increased muscle spasms, impaired flexibility, impaired tone, impaired UE functional use, improper  body mechanics, postural dysfunction, obesity, and pain.   ACTIVITY LIMITATIONS carrying, lifting, bending, standing, squatting, sleeping, stairs, transfers, bed mobility, bathing, toileting, dressing, reach over head, locomotion level, and caring for others  PARTICIPATION LIMITATIONS: meal prep, cleaning, laundry, shopping, community activity, and yard work  PERSONAL FACTORS Age, Fitness, Past/current experiences, Sex, Social background, Time since onset of injury/illness/exacerbation, Transportation, and 3+ comorbidities: back pain, CBP, chronic L shoulder pain, galactorrhea, neuropathy, HPV, hypercholestermeia, HTN, MS, osteopenia, PONV, wrist fracture  are also affecting patient's functional outcome.   REHAB POTENTIAL: Fair    CLINICAL DECISION MAKING: Evolving/moderate complexity  EVALUATION COMPLEXITY: Moderate  PLAN: PT FREQUENCY: 2x/week  PT DURATION: 12 weeks  PLANNED INTERVENTIONS: Therapeutic exercises, Therapeutic activity, Neuromuscular re-education, Balance training, Gait training, Patient/Family education, Self Care, Joint mobilization, Stair training, Vestibular training, Canalith repositioning, Visual/preceptual remediation/compensation, Orthotic/Fit training, DME instructions, Dry Needling, Electrical stimulation, Wheelchair mobility training, Spinal mobilization, Cryotherapy, Moist heat, Taping, Vasopneumatic device, Traction, Ultrasound, Ionotophoresis 36m/ml Dexamethasone, Manual therapy, and Re-evaluation  PLAN FOR NEXT SESSION: standing balance and strengthening, work on ambulation of decline and standing balance with decline.      MJanna Arch PT 06/29/2022, 1:18 PM

## 2022-06-29 ENCOUNTER — Ambulatory Visit: Payer: PPO

## 2022-06-29 DIAGNOSIS — M6281 Muscle weakness (generalized): Secondary | ICD-10-CM

## 2022-06-29 DIAGNOSIS — R269 Unspecified abnormalities of gait and mobility: Secondary | ICD-10-CM

## 2022-06-29 DIAGNOSIS — R262 Difficulty in walking, not elsewhere classified: Secondary | ICD-10-CM

## 2022-06-29 DIAGNOSIS — R2681 Unsteadiness on feet: Secondary | ICD-10-CM

## 2022-07-03 NOTE — Therapy (Signed)
OUTPATIENT PHYSICAL THERAPY NEURO TREATMENT  Patient Name: Lisa Williams MRN: [id number] DOB:08-25-1960, 62 y.o., female Today's Date: 07/04/2022  PCP: Lauro Regulus  REFERRING PROVIDER: Asa Lente MD   PT End of Session - 07/04/22 1011     Visit Number 38    Number of Visits 48    Date for PT Re-Evaluation 08/08/22    PT Start Time 1013    PT Stop Time 1055    PT Time Calculation (min) 42 min    Equipment Utilized During Treatment Gait belt    Activity Tolerance Patient tolerated treatment well;Patient limited by fatigue    Behavior During Therapy [facility] for tasks assessed/performed                              Past Medical History:  Diagnosis Date   Abdominal pain, right upper quadrant    Back pain    Calculus of kidney 12/09/2013   Chronic back pain    unspecified   Chronic left shoulder pain 07/19/2015   Complication of anesthesia    Functional disorder of bladder    other   Galactorrhea 11/26/2014   Chronic    Hereditary and idiopathic neuropathy 08/19/2013   History of kidney stones    HPV test positive    Hypercholesteremia 08/19/2013   Hypertension    Incomplete bladder emptying    Microscopic hematuria    MS (multiple sclerosis) (HCC)    Muscle spasticity 05/21/2014   Nonspecific findings on examination of urine    other   Osteopenia    PONV (postoperative nausea and vomiting)    Status post laparoscopic supracervical hysterectomy 11/26/2014   Tobacco user 11/26/2014   Wrist fracture    Past Surgical History:  Procedure Laterality Date   bilateral tubal ligation  1996   BREAST CYST EXCISION Left 2002   CYST EXCISION Left 05/10/2022   Procedure: CYST REMOVAL;  Surgeon: Carolan Shiver, MD;  Location: [facility] ORS;  Service: General;  Laterality: Left;   FRACTURE SURGERY     KNEE SURGERY     right   LAPAROSCOPIC SUPRACERVICAL HYSTERECTOMY  08/05/2013   ORIF WRIST FRACTURE Left 01/17/2017   Procedure: OPEN  REDUCTION INTERNAL FIXATION (ORIF) WRIST FRACTURE;  Surgeon: Lyndle Herrlich, MD;  Location: [facility] OR[facility];  Service: Orthopedics;  Laterality: Left;   RADIOLOGY WITH ANESTHESIA N/A 03/18/2020   Procedure: MRI WITH ANESTHESIA CERVICAL SPINE AND BRAIN  WITH AND WITHOUT CONTRAST;  Surgeon: Radiologist, Medication, MD;  Location: [facility] OR;  Service: Radiology;  Laterality: N/A;   TUBAL LIGATION Bilateral    VAGINAL HYSTERECTOMY  03/2006   Patient Active Problem List   Diagnosis Date Noted   Abscess of left groin 11/15/2021   Abnormal LFTs 11/15/2021   Acute respiratory disease due to COVID-19 virus 11/21/2020   Weakness    Hypoalbuminemia due to protein-calorie malnutrition (HCC)    Neurogenic bowel    Neurogenic bladder    Labile blood pressure    Neuropathic pain    Abscess of female pelvis    SVT (supraventricular tachycardia)    Radial styloid tenosynovitis 03/12/2018   Wheelchair confinement 02/27/2018   Localized osteoporosis with current pathological fracture with routine healing 01/19/2017   Wrist fracture 01/16/2017   Sprain of ankle 03/23/2016   Closed fracture of lateral malleolus 03/16/2016   Health care maintenance 01/24/2016   Blood pressure elevated without history of HTN 10/25/2015   Essential hypertension  10/25/2015   Multiple sclerosis (HCC) 10/02/2015   Chronic left shoulder pain 07/19/2015   Multiple sclerosis exacerbation (HCC) 07/14/2015   MS (multiple sclerosis) (HCC) 11/26/2014   Increased body mass index 11/26/2014   HPV test positive 11/26/2014   Status post laparoscopic supracervical hysterectomy 11/26/2014   Galactorrhea 11/26/2014   Back ache 05/21/2014   Adiposity 05/21/2014   Disordered sleep 05/21/2014   Muscle spasticity 05/21/2014   Spasticity 05/21/2014   Calculus of kidney 12/09/2013   Renal colic 12/09/2013   Hypercholesteremia 08/19/2013   Hereditary and idiopathic neuropathy 08/19/2013   Hypercholesterolemia without hypertriglyceridemia  08/19/2013   Bladder infection, chronic 07/25/2012   Disorder of bladder function 07/25/2012   Incomplete bladder emptying 07/25/2012   Microscopic hematuria 07/25/2012   Right upper quadrant pain 07/25/2012   ONSET DATE: 1995  REFERRING DIAG: MS  THERAPY DIAG:  Muscle weakness (generalized)  Difficulty in walking, not elsewhere classified  Unsteadiness on feet  Abnormality of gait and mobility  Rationale for Evaluation and Treatment Rehabilitation  SUBJECTIVE:                                                                                                                                                                                          SUBJECTIVE STATEMENT: Patient had a death in the family over the weekend.   .   Pt accompanied by: self  PERTINENT HISTORY: Patient is retuning to PT after hospitalization on 11/14/21 where she then went to SNF and then received home health therapy which stopped 02/08/22. Patient has weakness in BLE with RLE>LLE. She drives with hand controls. Patient has been diagnosed with MS in 1995.  PMH includes: back pain, CBP, chronic L shoulder pain, galactorrhea, neuropathy, HPV, hypercholestermeia, HTN, MS, osteopenia, PONV, wrist fracture. Started back driving about two weeks ago.   PAIN:  Are you having pain? Yes: NPRS scale: 2/10 Pain location: abdomen to R leg Pain description: spasm Aggravating factors: unknown Relieving factors: n/a  PRECAUTIONS: Fall  WEIGHT BEARING RESTRICTIONS No  FALLS: Has patient fallen in last 6 months? No  LIVING ENVIRONMENT: Lives with: lives with their family Lives in: House/apartment Stairs: Yes: Internal: full flight steps; but lives fully on first floor Has following equipment at home: Dan Humphreys - 2 wheeled, Wheelchair (manual), shower chair, Grab bars, and Ramped entry  PLOF: Independent with household mobility with device  PATIENT GOALS be able to get into the bed without assistance, improve  ambulation, standing tolerance, going up steps.   OBJECTIVE:   DIAGNOSTIC FINDINGS: MRI of the brain 03/18/2020 showed multiple T2/FLAIR hyperintense foci in the periventricular, juxtacortical and deep white matter.  There  were no infratentorial lesions noted.  None of the foci enhanced.   Due to severe claustrophobia, the study was done with conscious sedation in the hospital  MRI of the cervical spine 03/18/2020 showed T2 hyperintense foci at C1-C2, C2-C3, C4-C5, C5-C6 and T1.  There is a disc protrusion at C5-C6 to the left and a larger disc protrusion at C6-C7 to the right that distorts the thecal sac.  The adjacent spinal cord has normal signal.  There could be compression of the right C7 nerve root.   LOWER EXTREMITY MMT:    MMT Right Eval Left Eval  Hip flexion 2- 2  Hip extension 2- 2  Hip abduction 2+ 2+  Hip adduction 2+ 2+  Knee flexion 2- 2  Knee extension 2 2  Ankle dorsiflexion trace trace  Ankle plantarflexion trace trace  (Blank rows = not tested)   TODAY'S TREATMENT:    Treatment: Incline ambulation with focus on stabilization on incline with BrW and wheelchair follow.  30 ft. Cues for shifting weight and lifting limbs for foot clearance tolerated well.   Weighted blue ball: -chest press 10x -bicep curl to overhead press 10x -rainbow to knee 10x each side -D2 pattern 10x each side  5x STS with one seated rest break.       PATIENT EDUCATION: Education details: goals, POC, outcome measures Person educated: Patient Education method: Explanation, Demonstration, Tactile cues, and Verbal cues Education comprehension: verbalized understanding, returned demonstration, verbal cues required, and tactile cues required   HOME EXERCISE PROGRAM: Give next session  GOALS: Goals reviewed with patient? Yes  SHORT TERM GOALS: Target date: 06/27/2022    Patient will be independent in home exercise program to improve strength/mobility for better functional  independence with ADLs. Baseline:11/9: compliant 12/19: HEP compliant  Goal status: Partially Met  LONG TERM GOALS: Target date: 08/08/2022    Patient will increase FOTO score to equal to or greater than  52%   to demonstrate statistically significant improvement in mobility and quality of life Baseline: 10/10: 42% 11/9: 40% 12/19: 40% 1/9: 36% 2/1: 44%  Goal status: in progress  2.  Patient (> 48 years old) will complete five times sit to stand test in < 15 seconds indicating an increased LE strength and improved balance. Baseline: 10/10: 38 seconds with one plop heavy UE support 11/9: 26 seconds with heavy BUE support 12/19: 37 seconds with BUE support 1/9: unable to perform due to weakness s/p procedure 1/11: 29.2 seconds  2/1: 27 seconds with BUE support  Goal status: In progress  3.  Patient will increase 10 meter walk test to >0.5 m/s as to improve gait speed for better community ambulation and to reduce fall risk. Baseline: 10/10: 54 seconds with bRW andw/c follow 11/9: 43.3 seconds with RW and w/c follow 12/19: 40.9 seconds with RW and w/c follow 1/9: unable to perform due to weakness s/p procedure 1/11: 47 seconds with RW and w/c follow 2/1: 42.58 seconds with RW and w/c follow  Goal status: In progress  4.  Patient will be able to get into bed independently with use of railing to increase independence with mobility and nighttime routine.  Baseline: 10/10: needs complete assistance to bring RLE into bed. 11/9: needs assistance with leg; 12/19: min a for RLE 2/1: min A for RLE getting into bed  Goal status: In progress  5.  Patient will go up 2 small steps to enter sisters house with min A.  Baseline: 10/10: unable to perform 11/9:  unable to perform on 2 " step  12/18: unable to perform 1/9: unable to perform due to weakness s/p procedure 2/1: unable to perform  Goal status: IN PROGRESS  6.  Patient will be able to transfer from standard chair heights and booths for community  mobility.  Baseline: 1/9: unable 2/1: has not had a chance to do yet Goal status: Ongoing.  ASSESSMENT:  CLINICAL IMPRESSION: Patient presents with excellent motivation despite recent death in the family. She is challenged with incline ambulation this session, overheating requiring termination of task. She is able to perform sit to stands with only one rest break this session however. Patient will continue to benefit from skilled physical therapy intervention to address impairments, improve QOL, and attain therapy goals.    OBJECTIVE IMPAIRMENTS Abnormal gait, decreased activity tolerance, decreased balance, decreased coordination, decreased endurance, decreased mobility, difficulty walking, decreased ROM, decreased strength, hypomobility, increased edema, impaired perceived functional ability, increased muscle spasms, impaired flexibility, impaired tone, impaired UE functional use, improper body mechanics, postural dysfunction, obesity, and pain.   ACTIVITY LIMITATIONS carrying, lifting, bending, standing, squatting, sleeping, stairs, transfers, bed mobility, bathing, toileting, dressing, reach over head, locomotion level, and caring for others  PARTICIPATION LIMITATIONS: meal prep, cleaning, laundry, shopping, community activity, and yard work  PERSONAL FACTORS Age, Fitness, Past/current experiences, Sex, Social background, Time since onset of injury/illness/exacerbation, Transportation, and 3+ comorbidities: back pain, CBP, chronic L shoulder pain, galactorrhea, neuropathy, HPV, hypercholestermeia, HTN, MS, osteopenia, PONV, wrist fracture  are also affecting patient's functional outcome.   REHAB POTENTIAL: Fair    CLINICAL DECISION MAKING: Evolving/moderate complexity  EVALUATION COMPLEXITY: Moderate  PLAN: PT FREQUENCY: 2x/week  PT DURATION: 12 weeks  PLANNED INTERVENTIONS: Therapeutic exercises, Therapeutic activity, Neuromuscular re-education, Balance training, Gait training,  Patient/Family education, Self Care, Joint mobilization, Stair training, Vestibular training, Canalith repositioning, Visual/preceptual remediation/compensation, Orthotic/Fit training, DME instructions, Dry Needling, Electrical stimulation, Wheelchair mobility training, Spinal mobilization, Cryotherapy, Moist heat, Taping, Vasopneumatic device, Traction, Ultrasound, Ionotophoresis 4mg /ml Dexamethasone, Manual therapy, and Re-evaluation  PLAN FOR NEXT SESSION: standing balance and strengthening, work on ambulation of decline and standing balance with decline.      Precious Bard, PT 07/04/2022, 10:59 AM

## 2022-07-04 ENCOUNTER — Ambulatory Visit: Payer: PPO

## 2022-07-04 DIAGNOSIS — R2681 Unsteadiness on feet: Secondary | ICD-10-CM

## 2022-07-04 DIAGNOSIS — R269 Unspecified abnormalities of gait and mobility: Secondary | ICD-10-CM

## 2022-07-04 DIAGNOSIS — R262 Difficulty in walking, not elsewhere classified: Secondary | ICD-10-CM

## 2022-07-04 DIAGNOSIS — M6281 Muscle weakness (generalized): Secondary | ICD-10-CM | POA: Diagnosis not present

## 2022-07-06 ENCOUNTER — Ambulatory Visit: Payer: PPO

## 2022-07-10 NOTE — Therapy (Signed)
OUTPATIENT PHYSICAL THERAPY NEURO TREATMENT  Patient Name: Lisa Williams MRN: [id number] DOB:1960-07-12, 62 y.o., female Today's Date: 07/11/2022  PCP: Kirk Ruths  REFERRING PROVIDER: Britt Bottom MD   PT End of Session - 07/11/22 1016     Visit Number 39    Number of Visits 48    Date for PT Re-Evaluation 08/08/22    PT Start Time [id number]    PT Stop Time 1059    PT Time Calculation (min) 44 min    Equipment Utilized During Treatment Gait belt    Activity Tolerance Patient tolerated treatment well;Patient limited by fatigue    Behavior During Therapy [facility] for tasks assessed/performed                               Past Medical History:  Diagnosis Date   Abdominal pain, right upper quadrant    Back pain    Calculus of kidney 12/09/2013   Chronic back pain    unspecified   Chronic left shoulder pain [id number]   Complication of anesthesia    Functional disorder of bladder    other   Galactorrhea 11/26/2014   Chronic    Hereditary and idiopathic neuropathy 08/19/2013   History of kidney stones    HPV test positive    Hypercholesteremia 08/19/2013   Hypertension    Incomplete bladder emptying    Microscopic hematuria    MS (multiple sclerosis) (HCC)    Muscle spasticity 05/21/2014   Nonspecific findings on examination of urine    other   Osteopenia    PONV (postoperative nausea and vomiting)    Status post laparoscopic supracervical hysterectomy 11/26/2014   Tobacco user 11/26/2014   Wrist fracture    Past Surgical History:  Procedure Laterality Date   bilateral tubal ligation  1996   BREAST CYST EXCISION Left 2002   CYST EXCISION Left 05/10/2022   Procedure: CYST REMOVAL;  Surgeon: Herbert Pun, MD;  Location: [facility];  Service: General;  Laterality: Left;   FRACTURE SURGERY     KNEE SURGERY     right   LAPAROSCOPIC SUPRACERVICAL HYSTERECTOMY  08/05/2013   ORIF WRIST FRACTURE Left 01/17/2017   Procedure: OPEN  REDUCTION INTERNAL FIXATION (ORIF) WRIST FRACTURE;  Surgeon: Lovell Sheehan, MD;  Location: [facility];  Service: Orthopedics;  Laterality: Left;   RADIOLOGY WITH ANESTHESIA N/A 03/18/2020   Procedure: MRI WITH ANESTHESIA CERVICAL SPINE AND BRAIN  WITH AND WITHOUT CONTRAST;  Surgeon: Radiologist, Medication, MD;  Location: [facility];  Service: Radiology;  Laterality: N/A;   TUBAL LIGATION Bilateral    VAGINAL HYSTERECTOMY  03/2006   Patient Active Problem List   Diagnosis Date Noted   Abscess of left groin 11/15/2021   Abnormal LFTs 11/15/2021   Acute respiratory disease due to COVID-19 virus 11/21/2020   Weakness    Hypoalbuminemia due to protein-calorie malnutrition [facility])    Neurogenic bowel    Neurogenic bladder    Labile blood pressure    Neuropathic pain    Abscess of female pelvis    SVT (supraventricular tachycardia)    Radial styloid tenosynovitis 03/12/2018   Wheelchair confinement 02/27/2018   Localized osteoporosis with current pathological fracture with routine healing 01/19/2017   Wrist fracture 01/16/2017   Sprain of ankle 03/23/2016   Closed fracture of lateral malleolus 03/16/2016   Health care maintenance 01/24/2016   Blood pressure elevated without history of HTN 10/25/2015   Essential  hypertension 10/25/2015   Multiple sclerosis ([place]) 10/02/2015   Chronic left shoulder pain 07/19/2015   Multiple sclerosis exacerbation ([place]) 07/14/2015   MS (multiple sclerosis) ([place]) 11/26/2014   Increased body mass index 11/26/2014   HPV test positive 11/26/2014   Status post laparoscopic supracervical hysterectomy 11/26/2014   Galactorrhea 11/26/2014   Back ache 05/21/2014   Adiposity 05/21/2014   Disordered sleep 05/21/2014   Muscle spasticity 05/21/2014   Spasticity 05/21/2014   Calculus of kidney [id number]   Renal colic [id number]   Hypercholesteremia 08/19/2013   Hereditary and idiopathic neuropathy 08/19/2013   Hypercholesterolemia without hypertriglyceridemia  08/19/2013   Bladder infection, chronic 07/25/2012   Disorder of bladder function 07/25/2012   Incomplete bladder emptying 07/25/2012   Microscopic hematuria 07/25/2012   Right upper quadrant pain 07/25/2012   ONSET DATE: 1995  REFERRING DIAG: MS  THERAPY DIAG:  Muscle weakness (generalized)  Difficulty in walking, not elsewhere classified  Unsteadiness on feet  Abnormality of gait and mobility  Rationale for Evaluation and Treatment Rehabilitation  SUBJECTIVE:                                                                                                                                                                                          SUBJECTIVE STATEMENT: Patient reports back and leg pain causing her leg to buckle in standing today.  .   Pt accompanied by: self  PERTINENT HISTORY: Patient is retuning to PT after hospitalization on 11/14/21 where she then went to SNF and then received home health therapy which stopped 02/08/22. Patient has weakness in BLE with RLE>LLE. She drives with hand controls. Patient has been diagnosed with MS in 1995.  PMH includes: back pain, CBP, chronic L shoulder pain, galactorrhea, neuropathy, HPV, hypercholestermeia, HTN, MS, osteopenia, PONV, wrist fracture. Started back driving about two weeks ago.   PAIN:  Are you having pain? Yes: NPRS scale: 2/10 Pain location: abdomen to R leg Pain description: spasm Aggravating factors: unknown Relieving factors: n/a  PRECAUTIONS: Fall  WEIGHT BEARING RESTRICTIONS No  FALLS: Has patient fallen in last 6 months? No  LIVING ENVIRONMENT: Lives with: lives with their family Lives in: House/apartment Stairs: Yes: Internal: full flight steps; but lives fully on first floor Has following equipment at home: Gilford Rile - 2 wheeled, Wheelchair (manual), shower chair, Grab bars, and Ramped entry  PLOF: Independent with household mobility with device  PATIENT GOALS be able to get into the bed without  assistance, improve ambulation, standing tolerance, going up steps.   OBJECTIVE:   DIAGNOSTIC FINDINGS: MRI of the brain 03/18/2020 showed multiple T2/FLAIR hyperintense foci in the periventricular, juxtacortical and deep  white matter.  There were no infratentorial lesions noted.  None of the foci enhanced.   Due to severe claustrophobia, the study was done with conscious sedation in the hospital  MRI of the cervical spine 03/18/2020 showed T2 hyperintense foci at C1-C2, C2-C3, C4-C5, C5-C6 and T1.  There is a disc protrusion at C5-C6 to the left and a larger disc protrusion at C6-C7 to the right that distorts the thecal sac.  The adjacent spinal cord has normal signal.  There could be compression of the right C7 nerve root.   LOWER EXTREMITY MMT:    MMT Right Eval Left Eval  Hip flexion 2- 2  Hip extension 2- 2  Hip abduction 2+ 2+  Hip adduction 2+ 2+  Knee flexion 2- 2  Knee extension 2 2  Ankle dorsiflexion trace trace  Ankle plantarflexion trace trace  (Blank rows = not tested)   TODAY'S TREATMENT:    Treatment: Manual:  -STM to lumbar paraspinals with focus on effleurage and ptrissage x 14 minutes  TherEx: Forward/backward trunk lean 10x Hamstring stretch 60 seconds each LE Seated boxing: cues for sequencing, body mechanics, and pace/rhythm for functional contraction and timing of muscle recruitment: -Cross body punches to mitts on PT hands with no back support for focused core stabilization with crossing midline 2x 60 seconds  -Cross body punch with secondary combination of elbow for full cross body rotation to PT mitt for trunk stability, coordination, and cardiovascular challenge x 60 seconds -Upper cut to mitts in PT hands for core activation without back support with perturbations 60 seconds  Adduction bll squeeze 10x RTB abduction 10x      PATIENT EDUCATION: Education details: goals, POC, outcome measures Person educated: Patient Education method:  Explanation, Demonstration, Tactile cues, and Verbal cues Education comprehension: verbalized understanding, returned demonstration, verbal cues required, and tactile cues required   HOME EXERCISE PROGRAM: Give next session  GOALS: Goals reviewed with patient? Yes  SHORT TERM GOALS: Target date: 06/27/2022    Patient will be independent in home exercise program to improve strength/mobility for better functional independence with ADLs. Baseline:11/9: compliant 12/19: HEP compliant  Goal status: Partially Met  LONG TERM GOALS: Target date: 08/08/2022    Patient will increase FOTO score to equal to or greater than  52%   to demonstrate statistically significant improvement in mobility and quality of life Baseline: 10/10: 42% 11/9: 40% 12/19: 40% 1/9: 36% 2/1: 44%  Goal status: in progress  2.  Patient (> 50 years old) will complete five times sit to stand test in < 15 seconds indicating an increased LE strength and improved balance. Baseline: 10/10: 38 seconds with one plop heavy UE support 11/9: 26 seconds with heavy BUE support 12/19: 37 seconds with BUE support 1/9: unable to perform due to weakness s/p procedure 1/11: 29.2 seconds  2/1: 27 seconds with BUE support  Goal status: In progress  3.  Patient will increase 10 meter walk test to >0.5 m/s as to improve gait speed for better community ambulation and to reduce fall risk. Baseline: 10/10: 54 seconds with bRW andw/c follow 11/9: 43.3 seconds with RW and w/c follow 12/19: 40.9 seconds with RW and w/c follow 1/9: unable to perform due to weakness s/p procedure 1/11: 47 seconds with RW and w/c follow 2/1: 42.58 seconds with RW and w/c follow  Goal status: In progress  4.  Patient will be able to get into bed independently with use of railing to increase independence with mobility and  nighttime routine.  Baseline: 10/10: needs complete assistance to bring RLE into bed. 11/9: needs assistance with leg; 12/19: min a for RLE 2/1: min A  for RLE getting into bed  Goal status: In progress  5.  Patient will go up 2 small steps to enter sisters house with min A.  Baseline: 10/10: unable to perform 11/9: unable to perform on 2 " step  12/18: unable to perform 1/9: unable to perform due to weakness s/p procedure 2/1: unable to perform  Goal status: IN PROGRESS  6.  Patient will be able to transfer from standard chair heights and booths for community mobility.  Baseline: 1/9: unable 2/1: has not had a chance to do yet Goal status: Ongoing.  ASSESSMENT:  CLINICAL IMPRESSION: Patient presents with back pain radiating to leg limiting stability causing need for deferment of standing interventions. Patient agreeable and responds well to manual tx for pain reduction. Patient presents with good motivation throughout session despite pain. Patient will continue to benefit from skilled physical therapy intervention to address impairments, improve QOL, and attain therapy goals.    OBJECTIVE IMPAIRMENTS Abnormal gait, decreased activity tolerance, decreased balance, decreased coordination, decreased endurance, decreased mobility, difficulty walking, decreased ROM, decreased strength, hypomobility, increased edema, impaired perceived functional ability, increased muscle spasms, impaired flexibility, impaired tone, impaired UE functional use, improper body mechanics, postural dysfunction, obesity, and pain.   ACTIVITY LIMITATIONS carrying, lifting, bending, standing, squatting, sleeping, stairs, transfers, bed mobility, bathing, toileting, dressing, reach over head, locomotion level, and caring for others  PARTICIPATION LIMITATIONS: meal prep, cleaning, laundry, shopping, community activity, and yard work  PERSONAL FACTORS Age, Fitness, Past/current experiences, Sex, Social background, Time since onset of injury/illness/exacerbation, Transportation, and 3+ comorbidities: back pain, CBP, chronic L shoulder pain, galactorrhea, neuropathy, HPV,  hypercholestermeia, HTN, MS, osteopenia, PONV, wrist fracture  are also affecting patient's functional outcome.   REHAB POTENTIAL: Fair    CLINICAL DECISION MAKING: Evolving/moderate complexity  EVALUATION COMPLEXITY: Moderate  PLAN: PT FREQUENCY: 2x/week  PT DURATION: 12 weeks  PLANNED INTERVENTIONS: Therapeutic exercises, Therapeutic activity, Neuromuscular re-education, Balance training, Gait training, Patient/Family education, Self Care, Joint mobilization, Stair training, Vestibular training, Canalith repositioning, Visual/preceptual remediation/compensation, Orthotic/Fit training, DME instructions, Dry Needling, Electrical stimulation, Wheelchair mobility training, Spinal mobilization, Cryotherapy, Moist heat, Taping, Vasopneumatic device, Traction, Ultrasound, Ionotophoresis '4mg'$ /ml Dexamethasone, Manual therapy, and Re-evaluation  PLAN FOR NEXT SESSION: standing balance and strengthening, work on ambulation of decline and standing balance with decline.      Janna Arch, PT 07/11/2022, 10:59 AM

## 2022-07-11 ENCOUNTER — Ambulatory Visit: Payer: PPO | Attending: Neurology

## 2022-07-11 DIAGNOSIS — R262 Difficulty in walking, not elsewhere classified: Secondary | ICD-10-CM | POA: Diagnosis not present

## 2022-07-11 DIAGNOSIS — M6281 Muscle weakness (generalized): Secondary | ICD-10-CM | POA: Diagnosis not present

## 2022-07-11 DIAGNOSIS — I10 Essential (primary) hypertension: Secondary | ICD-10-CM | POA: Diagnosis not present

## 2022-07-11 DIAGNOSIS — R269 Unspecified abnormalities of gait and mobility: Secondary | ICD-10-CM | POA: Insufficient documentation

## 2022-07-11 DIAGNOSIS — R2681 Unsteadiness on feet: Secondary | ICD-10-CM | POA: Insufficient documentation

## 2022-07-11 DIAGNOSIS — E669 Obesity, unspecified: Secondary | ICD-10-CM | POA: Diagnosis not present

## 2022-07-12 NOTE — Therapy (Signed)
OUTPATIENT PHYSICAL THERAPY NEURO TREATMENT/ Physical Therapy Progress Note   Dates of reporting period  06/06/22   to   07/12/22   Patient Name: Lisa Williams MRN: [id number] DOB:[birth year], 62 y.o., female Today's Date: 07/13/2022  PCP: Kirk Ruths  REFERRING PROVIDER: Britt Bottom MD   PT End of Session - 07/13/22 1003     Visit Number 40    Number of Visits 48    Date for PT Re-Evaluation 08/08/22    PT Start Time [id number]    PT Stop Time 1058    PT Time Calculation (min) 43 min    Equipment Utilized During Treatment Gait belt    Activity Tolerance Patient tolerated treatment well;Patient limited by fatigue    Behavior During Therapy [facility] for tasks assessed/performed                                Past Medical History:  Diagnosis Date   Abdominal pain, right upper quadrant    Back pain    Calculus of kidney 12/09/2013   Chronic back pain    unspecified   Chronic left shoulder pain [id number]   Complication of anesthesia    Functional disorder of bladder    other   Galactorrhea 11/26/2014   Chronic    Hereditary and idiopathic neuropathy 08/19/2013   History of kidney stones    HPV test positive    Hypercholesteremia 08/19/2013   Hypertension    Incomplete bladder emptying    Microscopic hematuria    MS (multiple sclerosis) (HCC)    Muscle spasticity 05/21/2014   Nonspecific findings on examination of urine    other   Osteopenia    PONV (postoperative nausea and vomiting)    Status post laparoscopic supracervical hysterectomy 11/26/2014   Tobacco user 11/26/2014   Wrist fracture    Past Surgical History:  Procedure Laterality Date   bilateral tubal ligation  1996   BREAST CYST EXCISION Left 2002   CYST EXCISION Left 05/10/2022   Procedure: CYST REMOVAL;  Surgeon: Herbert Pun, MD;  Location: [facility] OR[facility];  Service: General;  Laterality: Left;   FRACTURE SURGERY     KNEE SURGERY     right   LAPAROSCOPIC  SUPRACERVICAL HYSTERECTOMY  08/05/2013   ORIF WRIST FRACTURE Left 01/17/2017   Procedure: OPEN REDUCTION INTERNAL FIXATION (ORIF) WRIST FRACTURE;  Surgeon: Lovell Sheehan, MD;  Location: [facility];  Service: Orthopedics;  Laterality: Left;   RADIOLOGY WITH ANESTHESIA N/A 03/18/2020   Procedure: MRI WITH ANESTHESIA CERVICAL SPINE AND BRAIN  WITH AND WITHOUT CONTRAST;  Surgeon: Radiologist, Medication, MD;  Location: [place];  Service: Radiology;  Laterality: N/A;   TUBAL LIGATION Bilateral    VAGINAL HYSTERECTOMY  03/2006   Patient Active Problem List   Diagnosis Date Noted   Abscess of left groin 11/15/2021   Abnormal LFTs 11/15/2021   Acute respiratory disease due to COVID-19 virus 11/21/2020   Weakness    Hypoalbuminemia due to protein-calorie malnutrition [facility])    Neurogenic bowel    Neurogenic bladder    Labile blood pressure    Neuropathic pain    Abscess of female pelvis    SVT (supraventricular tachycardia)    Radial styloid tenosynovitis 03/12/2018   Wheelchair confinement 02/27/2018   Localized osteoporosis with current pathological fracture with routine healing 01/19/2017   Wrist fracture 01/16/2017   Sprain of ankle 03/23/2016   Closed fracture of lateral malleolus  03/16/2016   Health care maintenance 01/24/2016   Blood pressure elevated without history of HTN 10/25/2015   Essential hypertension 10/25/2015   Multiple sclerosis ([place]) 10/02/2015   Chronic left shoulder pain 07/19/2015   Multiple sclerosis exacerbation ([place]) 07/14/2015   MS (multiple sclerosis) ([place]) 11/26/2014   Increased body mass index 11/26/2014   HPV test positive 11/26/2014   Status post laparoscopic supracervical hysterectomy 11/26/2014   Galactorrhea 11/26/2014   Back ache 05/21/2014   Adiposity 05/21/2014   Disordered sleep 05/21/2014   Muscle spasticity 05/21/2014   Spasticity 05/21/2014   Calculus of kidney [id number]   Renal colic [id number]   Hypercholesteremia 08/19/2013   Hereditary  and idiopathic neuropathy 08/19/2013   Hypercholesterolemia without hypertriglyceridemia 08/19/2013   Bladder infection, chronic 07/25/2012   Disorder of bladder function 07/25/2012   Incomplete bladder emptying 07/25/2012   Microscopic hematuria 07/25/2012   Right upper quadrant pain 07/25/2012   ONSET DATE: 1995  REFERRING DIAG: MS  THERAPY DIAG:  Muscle weakness (generalized)  Difficulty in walking, not elsewhere classified  Unsteadiness on feet  Abnormality of gait and mobility  Rationale for Evaluation and Treatment Rehabilitation  SUBJECTIVE:                                                                                                                                                                                          SUBJECTIVE STATEMENT: Patient reports her leg and back are feeling better today.  .   Pt accompanied by: self  PERTINENT HISTORY: Patient is retuning to PT after hospitalization on 11/14/21 where she then went to SNF and then received home health therapy which stopped 02/08/22. Patient has weakness in BLE with RLE>LLE. She drives with hand controls. Patient has been diagnosed with MS in 1995.  PMH includes: back pain, CBP, chronic L shoulder pain, galactorrhea, neuropathy, HPV, hypercholestermeia, HTN, MS, osteopenia, PONV, wrist fracture. Started back driving about two weeks ago.   PAIN:  Are you having pain? Yes: NPRS scale: 2/10 Pain location: abdomen to R leg Pain description: spasm Aggravating factors: unknown Relieving factors: n/a  PRECAUTIONS: Fall  WEIGHT BEARING RESTRICTIONS No  FALLS: Has patient fallen in last 6 months? No  LIVING ENVIRONMENT: Lives with: lives with their family Lives in: House/apartment Stairs: Yes: Internal: full flight steps; but lives fully on first floor Has following equipment at home: Gilford Rile - 2 wheeled, Wheelchair (manual), shower chair, Grab bars, and Ramped entry  PLOF: Independent with household  mobility with device  PATIENT GOALS be able to get into the bed without assistance, improve ambulation, standing tolerance, going up steps.   OBJECTIVE:   DIAGNOSTIC FINDINGS:  MRI of the brain 03/18/2020 showed multiple T2/FLAIR hyperintense foci in the periventricular, juxtacortical and deep white matter.  There were no infratentorial lesions noted.  None of the foci enhanced.   Due to severe claustrophobia, the study was done with conscious sedation in the hospital  MRI of the cervical spine 03/18/2020 showed T2 hyperintense foci at C1-C2, C2-C3, C4-C5, C5-C6 and T1.  There is a disc protrusion at C5-C6 to the left and a larger disc protrusion at C6-C7 to the right that distorts the thecal sac.  The adjacent spinal cord has normal signal.  There could be compression of the right C7 nerve root.   LOWER EXTREMITY MMT:    MMT Right Eval Left Eval  Hip flexion 2- 2  Hip extension 2- 2  Hip abduction 2+ 2+  Hip adduction 2+ 2+  Knee flexion 2- 2  Knee extension 2 2  Ankle dorsiflexion trace trace  Ankle plantarflexion trace trace  (Blank rows = not tested)   TODAY'S TREATMENT:   *Perform goals: see below for details:    Treatment:  TherEx: Weighted blue ball: -chest press 10x -bicep curl to overhead press 10x -rainbow to knee 10x each side -D2 pattern 10x each side -twist 10x each side  Adduction ball squeeze 10x      PATIENT EDUCATION: Education details: goals, POC, outcome measures Person educated: Patient Education method: Explanation, Demonstration, Tactile cues, and Verbal cues Education comprehension: verbalized understanding, returned demonstration, verbal cues required, and tactile cues required   HOME EXERCISE PROGRAM: Give next session  GOALS: Goals reviewed with patient? Yes  SHORT TERM GOALS: Target date: 06/27/2022    Patient will be independent in home exercise program to improve strength/mobility for better functional independence with  ADLs. Baseline:11/9: compliant 12/19: HEP compliant  Goal status: Partially Met  LONG TERM GOALS: Target date: 08/08/2022    Patient will increase FOTO score to equal to or greater than  52%   to demonstrate statistically significant improvement in mobility and quality of life Baseline: 10/10: 42% 11/9: 40% 12/19: 40% 1/9: 36% 2/1: 44%  3/7: 44% Goal status: in progress  2.  Patient (> 75 years old) will complete five times sit to stand test in < 15 seconds indicating an increased LE strength and improved balance. Baseline: 10/10: 38 seconds with one plop heavy UE support 11/9: 26 seconds with heavy BUE support 12/19: 37 seconds with BUE support 1/9: unable to perform due to weakness s/p procedure 1/11: 29.2 seconds  2/1: 27 seconds with BUE support 3/7: 23.37 second heavy BUE support  Goal status: In progress  3.  Patient will increase 10 meter walk test to >0.5 m/s as to improve gait speed for better community ambulation and to reduce fall risk. Baseline: 10/10: 54 seconds with bRW andw/c follow 11/9: 43.3 seconds with RW and w/c follow 12/19: 40.9 seconds with RW and w/c follow 1/9: unable to perform due to weakness s/p procedure 1/11: 47 seconds with RW and w/c follow 2/1: 42.58 seconds with RW and w/c follow 3/7: 45 seconds with bRW and w/c follow  Goal status: In progress  4.  Patient will be able to get into bed independently with use of railing to increase independence with mobility and nighttime routine.  Baseline: 10/10: needs complete assistance to bring RLE into bed. 11/9: needs assistance with leg; 12/19: min a for RLE 2/1: min A for RLE getting into bed 3/7: halfway there per patient report  Goal status: In progress  5.  Patient will go up 2 small steps to enter sisters house with min A.  Baseline: 10/10: unable to perform 11/9: unable to perform on 2 " step  12/18: unable to perform 1/9: unable to perform due to weakness s/p procedure 2/1: unable to perform  Goal status:  Defer  6.  Patient will be able to transfer from standard chair heights and booths for community mobility.  Baseline: 1/9: unable 2/1: has not had a chance to do yet 3/7: not had an opportunity yet Goal status: Ongoing.  ASSESSMENT:  CLINICAL IMPRESSION: Patient has improved sit to stand time but is slightly slower with 10 MWT due to residual effects of back pain from earlier this week. Patient is highly motivated throughout session and eager to progress her mobility.  Patient's condition has the potential to improve in response to therapy. Maximum improvement is yet to be obtained. The anticipated improvement is attainable and reasonable in a generally predictable time. Patient will continue to benefit from skilled physical therapy intervention to address impairments, improve QOL, and attain therapy goals.    OBJECTIVE IMPAIRMENTS Abnormal gait, decreased activity tolerance, decreased balance, decreased coordination, decreased endurance, decreased mobility, difficulty walking, decreased ROM, decreased strength, hypomobility, increased edema, impaired perceived functional ability, increased muscle spasms, impaired flexibility, impaired tone, impaired UE functional use, improper body mechanics, postural dysfunction, obesity, and pain.   ACTIVITY LIMITATIONS carrying, lifting, bending, standing, squatting, sleeping, stairs, transfers, bed mobility, bathing, toileting, dressing, reach over head, locomotion level, and caring for others  PARTICIPATION LIMITATIONS: meal prep, cleaning, laundry, shopping, community activity, and yard work  PERSONAL FACTORS Age, Fitness, Past/current experiences, Sex, Social background, Time since onset of injury/illness/exacerbation, Transportation, and 3+ comorbidities: back pain, CBP, chronic L shoulder pain, galactorrhea, neuropathy, HPV, hypercholestermeia, HTN, MS, osteopenia, PONV, wrist fracture  are also affecting patient's functional outcome.   REHAB POTENTIAL:  Fair    CLINICAL DECISION MAKING: Evolving/moderate complexity  EVALUATION COMPLEXITY: Moderate  PLAN: PT FREQUENCY: 2x/week  PT DURATION: 12 weeks  PLANNED INTERVENTIONS: Therapeutic exercises, Therapeutic activity, Neuromuscular re-education, Balance training, Gait training, Patient/Family education, Self Care, Joint mobilization, Stair training, Vestibular training, Canalith repositioning, Visual/preceptual remediation/compensation, Orthotic/Fit training, DME instructions, Dry Needling, Electrical stimulation, Wheelchair mobility training, Spinal mobilization, Cryotherapy, Moist heat, Taping, Vasopneumatic device, Traction, Ultrasound, Ionotophoresis '4mg'$ /ml Dexamethasone, Manual therapy, and Re-evaluation  PLAN FOR NEXT SESSION: standing balance and strengthening, work on ambulation of decline and standing balance with decline.      Janna Arch, PT 07/13/2022, 10:59 AM

## 2022-07-13 ENCOUNTER — Ambulatory Visit: Payer: PPO

## 2022-07-13 DIAGNOSIS — M6281 Muscle weakness (generalized): Secondary | ICD-10-CM | POA: Diagnosis not present

## 2022-07-13 DIAGNOSIS — R262 Difficulty in walking, not elsewhere classified: Secondary | ICD-10-CM

## 2022-07-13 DIAGNOSIS — R269 Unspecified abnormalities of gait and mobility: Secondary | ICD-10-CM

## 2022-07-13 DIAGNOSIS — R2681 Unsteadiness on feet: Secondary | ICD-10-CM

## 2022-07-17 NOTE — Therapy (Signed)
OUTPATIENT PHYSICAL THERAPY NEURO TREATMENT/ Physical Therapy Progress Note   Dates of reporting period  06/06/22   to   07/12/22   Patient Name: Lisa Williams MRN: [id number] DOB:1960/05/23, 62 y.o., female Today's Date: 07/17/2022  PCP: Kirk Ruths  REFERRING PROVIDER: Britt Bottom MD                        Past Medical History:  Diagnosis Date   Abdominal pain, right upper quadrant    Back pain    Calculus of kidney 12/09/2013   Chronic back pain    unspecified   Chronic left shoulder pain [id number]   Complication of anesthesia    Functional disorder of bladder    other   Galactorrhea 11/26/2014   Chronic    Hereditary and idiopathic neuropathy 08/19/2013   History of kidney stones    HPV test positive    Hypercholesteremia 08/19/2013   Hypertension    Incomplete bladder emptying    Microscopic hematuria    MS (multiple sclerosis) (HCC)    Muscle spasticity 05/21/2014   Nonspecific findings on examination of urine    other   Osteopenia    PONV (postoperative nausea and vomiting)    Status post laparoscopic supracervical hysterectomy 11/26/2014   Tobacco user 11/26/2014   Wrist fracture    Past Surgical History:  Procedure Laterality Date   bilateral tubal ligation  1996   BREAST CYST EXCISION Left 2002   CYST EXCISION Left 05/10/2022   Procedure: CYST REMOVAL;  Surgeon: Herbert Pun, MD;  Location: [facility];  Service: General;  Laterality: Left;   FRACTURE SURGERY     KNEE SURGERY     right   LAPAROSCOPIC SUPRACERVICAL HYSTERECTOMY  08/05/2013   ORIF WRIST FRACTURE Left 01/17/2017   Procedure: OPEN REDUCTION INTERNAL FIXATION (ORIF) WRIST FRACTURE;  Surgeon: Lovell Sheehan, MD;  Location: [facility] OR[facility];  Service: Orthopedics;  Laterality: Left;   RADIOLOGY WITH ANESTHESIA N/A 03/18/2020   Procedure: MRI WITH ANESTHESIA CERVICAL SPINE AND BRAIN  WITH AND WITHOUT CONTRAST;  Surgeon: Radiologist, Medication, MD;   Location: [place];  Service: Radiology;  Laterality: N/A;   TUBAL LIGATION Bilateral    VAGINAL HYSTERECTOMY  03/2006   Patient Active Problem List   Diagnosis Date Noted   Abscess of left groin 11/15/2021   Abnormal LFTs 11/15/2021   Acute respiratory disease due to COVID-19 virus 11/21/2020   Weakness    Hypoalbuminemia due to protein-calorie malnutrition (HCC)    Neurogenic bowel    Neurogenic bladder    Labile blood pressure    Neuropathic pain    Abscess of female pelvis    SVT (supraventricular tachycardia)    Radial styloid tenosynovitis 03/12/2018   Wheelchair confinement 02/27/2018   Localized osteoporosis with current pathological fracture with routine healing 01/19/2017   Wrist fracture 01/16/2017   Sprain of ankle 03/23/2016   Closed fracture of lateral malleolus 03/16/2016   Health care maintenance 01/24/2016   Blood pressure elevated without history of HTN 10/25/2015   Essential hypertension 10/25/2015   Multiple sclerosis ([place]) 10/02/2015   Chronic left shoulder pain 07/19/2015   Multiple sclerosis exacerbation ([place]) 07/14/2015   MS (multiple sclerosis) ([place]) 11/26/2014   Increased body mass index 11/26/2014   HPV test positive 11/26/2014   Status post laparoscopic supracervical hysterectomy 11/26/2014   Galactorrhea 11/26/2014   Back ache 05/21/2014   Adiposity 05/21/2014   Disordered sleep 05/21/2014   Muscle spasticity 05/21/2014  Spasticity 05/21/2014   Calculus of kidney [id number]   Renal colic [id number]   Hypercholesteremia 08/19/2013   Hereditary and idiopathic neuropathy 08/19/2013   Hypercholesterolemia without hypertriglyceridemia 08/19/2013   Bladder infection, chronic 07/25/2012   Disorder of bladder function 07/25/2012   Incomplete bladder emptying 07/25/2012   Microscopic hematuria 07/25/2012   Right upper quadrant pain 07/25/2012   ONSET DATE: 1995  REFERRING DIAG: MS  THERAPY DIAG:  No diagnosis found.  Rationale for Evaluation  and Treatment Rehabilitation  SUBJECTIVE:                                                                                                                                                                                          SUBJECTIVE STATEMENT: Patient reports her leg and back are feeling better today.  .   Pt accompanied by: self  PERTINENT HISTORY: Patient is retuning to PT after hospitalization on 11/14/21 where she then went to SNF and then received home health therapy which stopped 02/08/22. Patient has weakness in BLE with RLE>LLE. She drives with hand controls. Patient has been diagnosed with MS in 1995.  PMH includes: back pain, CBP, chronic L shoulder pain, galactorrhea, neuropathy, HPV, hypercholestermeia, HTN, MS, osteopenia, PONV, wrist fracture. Started back driving about two weeks ago.   PAIN:  Are you having pain? Yes: NPRS scale: 2/10 Pain location: abdomen to R leg Pain description: spasm Aggravating factors: unknown Relieving factors: n/a  PRECAUTIONS: Fall  WEIGHT BEARING RESTRICTIONS No  FALLS: Has patient fallen in last 6 months? No  LIVING ENVIRONMENT: Lives with: lives with their family Lives in: House/apartment Stairs: Yes: Internal: full flight steps; but lives fully on first floor Has following equipment at home: Gilford Rile - 2 wheeled, Wheelchair (manual), shower chair, Grab bars, and Ramped entry  PLOF: Independent with household mobility with device  PATIENT GOALS be able to get into the bed without assistance, improve ambulation, standing tolerance, going up steps.   OBJECTIVE:   DIAGNOSTIC FINDINGS: MRI of the brain 03/18/2020 showed multiple T2/FLAIR hyperintense foci in the periventricular, juxtacortical and deep white matter.  There were no infratentorial lesions noted.  None of the foci enhanced.   Due to severe claustrophobia, the study was done with conscious sedation in the hospital  MRI of the cervical spine 03/18/2020 showed T2 hyperintense  foci at C1-C2, C2-C3, C4-C5, C5-C6 and T1.  There is a disc protrusion at C5-C6 to the left and a larger disc protrusion at C6-C7 to the right that distorts the thecal sac.  The adjacent spinal cord has normal signal.  There could be compression of the right C7 nerve root.  LOWER EXTREMITY MMT:    MMT Right Eval Left Eval  Hip flexion 2- 2  Hip extension 2- 2  Hip abduction 2+ 2+  Hip adduction 2+ 2+  Knee flexion 2- 2  Knee extension 2 2  Ankle dorsiflexion trace trace  Ankle plantarflexion trace trace  (Blank rows = not tested)   TODAY'S TREATMENT:   Treatment: Incline ambulation with focus on stabilization on incline with BrW and wheelchair follow.  30 ft. Cues for shifting weight and lifting limbs for foot clearance tolerated well.    Weighted blue ball: -chest press 10x -bicep curl to overhead press 10x -rainbow to knee 10x each side -D2 pattern 10x each side   5x STS with one seated rest break.      PATIENT EDUCATION: Education details: goals, POC, outcome measures Person educated: Patient Education method: Explanation, Demonstration, Tactile cues, and Verbal cues Education comprehension: verbalized understanding, returned demonstration, verbal cues required, and tactile cues required   HOME EXERCISE PROGRAM: Give next session  GOALS: Goals reviewed with patient? Yes  SHORT TERM GOALS: Target date: 06/27/2022    Patient will be independent in home exercise program to improve strength/mobility for better functional independence with ADLs. Baseline:11/9: compliant 12/19: HEP compliant  Goal status: Partially Met  LONG TERM GOALS: Target date: 08/08/2022    Patient will increase FOTO score to equal to or greater than  52%   to demonstrate statistically significant improvement in mobility and quality of life Baseline: 10/10: 42% 11/9: 40% 12/19: 40% 1/9: 36% 2/1: 44%  3/7: 44% Goal status: in progress  2.  Patient (> 72 years old) will complete five times  sit to stand test in < 15 seconds indicating an increased LE strength and improved balance. Baseline: 10/10: 38 seconds with one plop heavy UE support 11/9: 26 seconds with heavy BUE support 12/19: 37 seconds with BUE support 1/9: unable to perform due to weakness s/p procedure 1/11: 29.2 seconds  2/1: 27 seconds with BUE support 3/7: 23.37 second heavy BUE support  Goal status: In progress  3.  Patient will increase 10 meter walk test to >0.5 m/s as to improve gait speed for better community ambulation and to reduce fall risk. Baseline: 10/10: 54 seconds with bRW andw/c follow 11/9: 43.3 seconds with RW and w/c follow 12/19: 40.9 seconds with RW and w/c follow 1/9: unable to perform due to weakness s/p procedure 1/11: 47 seconds with RW and w/c follow 2/1: 42.58 seconds with RW and w/c follow 3/7: 45 seconds with bRW and w/c follow  Goal status: In progress  4.  Patient will be able to get into bed independently with use of railing to increase independence with mobility and nighttime routine.  Baseline: 10/10: needs complete assistance to bring RLE into bed. 11/9: needs assistance with leg; 12/19: min a for RLE 2/1: min A for RLE getting into bed 3/7: halfway there per patient report  Goal status: In progress  5.  Patient will go up 2 small steps to enter sisters house with min A.  Baseline: 10/10: unable to perform 11/9: unable to perform on 2 " step  12/18: unable to perform 1/9: unable to perform due to weakness s/p procedure 2/1: unable to perform  Goal status: Defer  6.  Patient will be able to transfer from standard chair heights and booths for community mobility.  Baseline: 1/9: unable 2/1: has not had a chance to do yet 3/7: not had an opportunity yet Goal status: Ongoing.  ASSESSMENT:  CLINICAL IMPRESSION: ***Patient will continue to benefit from skilled physical therapy intervention to address impairments, improve QOL, and attain therapy goals.    OBJECTIVE IMPAIRMENTS Abnormal  gait, decreased activity tolerance, decreased balance, decreased coordination, decreased endurance, decreased mobility, difficulty walking, decreased ROM, decreased strength, hypomobility, increased edema, impaired perceived functional ability, increased muscle spasms, impaired flexibility, impaired tone, impaired UE functional use, improper body mechanics, postural dysfunction, obesity, and pain.   ACTIVITY LIMITATIONS carrying, lifting, bending, standing, squatting, sleeping, stairs, transfers, bed mobility, bathing, toileting, dressing, reach over head, locomotion level, and caring for others  PARTICIPATION LIMITATIONS: meal prep, cleaning, laundry, shopping, community activity, and yard work  PERSONAL FACTORS Age, Fitness, Past/current experiences, Sex, Social background, Time since onset of injury/illness/exacerbation, Transportation, and 3+ comorbidities: back pain, CBP, chronic L shoulder pain, galactorrhea, neuropathy, HPV, hypercholestermeia, HTN, MS, osteopenia, PONV, wrist fracture  are also affecting patient's functional outcome.   REHAB POTENTIAL: Fair    CLINICAL DECISION MAKING: Evolving/moderate complexity  EVALUATION COMPLEXITY: Moderate  PLAN: PT FREQUENCY: 2x/week  PT DURATION: 12 weeks  PLANNED INTERVENTIONS: Therapeutic exercises, Therapeutic activity, Neuromuscular re-education, Balance training, Gait training, Patient/Family education, Self Care, Joint mobilization, Stair training, Vestibular training, Canalith repositioning, Visual/preceptual remediation/compensation, Orthotic/Fit training, DME instructions, Dry Needling, Electrical stimulation, Wheelchair mobility training, Spinal mobilization, Cryotherapy, Moist heat, Taping, Vasopneumatic device, Traction, Ultrasound, Ionotophoresis '4mg'$ /ml Dexamethasone, Manual therapy, and Re-evaluation  PLAN FOR NEXT SESSION: standing balance and strengthening, work on ambulation of decline and standing balance with decline.       Janna Arch, PT 07/17/2022, 12:32 PM

## 2022-07-18 ENCOUNTER — Ambulatory Visit: Payer: PPO

## 2022-07-18 DIAGNOSIS — G35 Multiple sclerosis: Secondary | ICD-10-CM | POA: Diagnosis not present

## 2022-07-18 DIAGNOSIS — R269 Unspecified abnormalities of gait and mobility: Secondary | ICD-10-CM

## 2022-07-18 DIAGNOSIS — R262 Difficulty in walking, not elsewhere classified: Secondary | ICD-10-CM

## 2022-07-18 DIAGNOSIS — J019 Acute sinusitis, unspecified: Secondary | ICD-10-CM | POA: Diagnosis not present

## 2022-07-18 DIAGNOSIS — M6281 Muscle weakness (generalized): Secondary | ICD-10-CM | POA: Diagnosis not present

## 2022-07-18 DIAGNOSIS — I10 Essential (primary) hypertension: Secondary | ICD-10-CM | POA: Diagnosis not present

## 2022-07-18 DIAGNOSIS — E669 Obesity, unspecified: Secondary | ICD-10-CM | POA: Diagnosis not present

## 2022-07-18 DIAGNOSIS — R2681 Unsteadiness on feet: Secondary | ICD-10-CM

## 2022-07-18 DIAGNOSIS — Z1211 Encounter for screening for malignant neoplasm of colon: Secondary | ICD-10-CM | POA: Diagnosis not present

## 2022-07-18 DIAGNOSIS — Z03818 Encounter for observation for suspected exposure to other biological agents ruled out: Secondary | ICD-10-CM | POA: Diagnosis not present

## 2022-07-18 DIAGNOSIS — Z0000 Encounter for general adult medical examination without abnormal findings: Secondary | ICD-10-CM | POA: Diagnosis not present

## 2022-07-19 NOTE — Therapy (Signed)
OUTPATIENT PHYSICAL THERAPY NEURO TREATMENT  Patient Name: Lisa Williams MRN: [id number] DOB:07-12-1960, 62 y.o., female Today's Date: 07/20/2022  PCP: Kirk Ruths  REFERRING PROVIDER: Britt Bottom MD   PT End of Session - 07/20/22 1015     Visit Number 42    Number of Visits 48    Date for PT Re-Evaluation 08/08/22    PT Start Time [id number]    PT Stop Time 1058    PT Time Calculation (min) 43 min    Equipment Utilized During Treatment Gait belt    Activity Tolerance Patient tolerated treatment well;Patient limited by fatigue    Behavior During Therapy [facility] for tasks assessed/performed                                  Past Medical History:  Diagnosis Date   Abdominal pain, right upper quadrant    Back pain    Calculus of kidney 12/09/2013   Chronic back pain    unspecified   Chronic left shoulder pain [id number]   Complication of anesthesia    Functional disorder of bladder    other   Galactorrhea 11/26/2014   Chronic    Hereditary and idiopathic neuropathy 08/19/2013   History of kidney stones    HPV test positive    Hypercholesteremia 08/19/2013   Hypertension    Incomplete bladder emptying    Microscopic hematuria    MS (multiple sclerosis) (HCC)    Muscle spasticity 05/21/2014   Nonspecific findings on examination of urine    other   Osteopenia    PONV (postoperative nausea and vomiting)    Status post laparoscopic supracervical hysterectomy 11/26/2014   Tobacco user 11/26/2014   Wrist fracture    Past Surgical History:  Procedure Laterality Date   bilateral tubal ligation  1996   BREAST CYST EXCISION Left 2002   CYST EXCISION Left 05/10/2022   Procedure: CYST REMOVAL;  Surgeon: Herbert Pun, MD;  Location: [facility];  Service: General;  Laterality: Left;   FRACTURE SURGERY     KNEE SURGERY     right   LAPAROSCOPIC SUPRACERVICAL HYSTERECTOMY  08/05/2013   ORIF WRIST FRACTURE Left 01/17/2017   Procedure:  OPEN REDUCTION INTERNAL FIXATION (ORIF) WRIST FRACTURE;  Surgeon: Lovell Sheehan, MD;  Location: [facility];  Service: Orthopedics;  Laterality: Left;   RADIOLOGY WITH ANESTHESIA N/A 03/18/2020   Procedure: MRI WITH ANESTHESIA CERVICAL SPINE AND BRAIN  WITH AND WITHOUT CONTRAST;  Surgeon: Radiologist, Medication, MD;  Location: [facility];  Service: Radiology;  Laterality: N/A;   TUBAL LIGATION Bilateral    VAGINAL HYSTERECTOMY  03/2006   Patient Active Problem List   Diagnosis Date Noted   Abscess of left groin 11/15/2021   Abnormal LFTs 11/15/2021   Acute respiratory disease due to COVID-19 virus 11/21/2020   Weakness    Hypoalbuminemia due to protein-calorie malnutrition [facility])    Neurogenic bowel    Neurogenic bladder    Labile blood pressure    Neuropathic pain    Abscess of female pelvis    SVT (supraventricular tachycardia)    Radial styloid tenosynovitis 03/12/2018   Wheelchair confinement 02/27/2018   Localized osteoporosis with current pathological fracture with routine healing 01/19/2017   Wrist fracture 01/16/2017   Sprain of ankle 03/23/2016   Closed fracture of lateral malleolus 03/16/2016   Health care maintenance 01/24/2016   Blood pressure elevated without history of HTN 10/25/2015  Essential hypertension 10/25/2015   Multiple sclerosis ([place]) 10/02/2015   Chronic left shoulder pain 07/19/2015   Multiple sclerosis exacerbation ([place]) 07/14/2015   MS (multiple sclerosis) ([place]) 11/26/2014   Increased body mass index 11/26/2014   HPV test positive 11/26/2014   Status post laparoscopic supracervical hysterectomy 11/26/2014   Galactorrhea 11/26/2014   Back ache 05/21/2014   Adiposity 05/21/2014   Disordered sleep 05/21/2014   Muscle spasticity 05/21/2014   Spasticity 05/21/2014   Calculus of kidney [id number]   Renal colic [id number]   Hypercholesteremia 08/19/2013   Hereditary and idiopathic neuropathy 08/19/2013   Hypercholesterolemia without hypertriglyceridemia  08/19/2013   Bladder infection, chronic 07/25/2012   Disorder of bladder function 07/25/2012   Incomplete bladder emptying 07/25/2012   Microscopic hematuria 07/25/2012   Right upper quadrant pain 07/25/2012   ONSET DATE: 1995  REFERRING DIAG: MS  THERAPY DIAG:  Muscle weakness (generalized)  Difficulty in walking, not elsewhere classified  Unsteadiness on feet  Abnormality of gait and mobility  Rationale for Evaluation and Treatment Rehabilitation  SUBJECTIVE:                                                                                                                                                                                          SUBJECTIVE STATEMENT: Patient reports the doctor put her on antibiotics for a respiratory infection.  .   Pt accompanied by: self  PERTINENT HISTORY: Patient is retuning to PT after hospitalization on 11/14/21 where she then went to SNF and then received home health therapy which stopped 02/08/22. Patient has weakness in BLE with RLE>LLE. She drives with hand controls. Patient has been diagnosed with MS in 1995.  PMH includes: back pain, CBP, chronic L shoulder pain, galactorrhea, neuropathy, HPV, hypercholestermeia, HTN, MS, osteopenia, PONV, wrist fracture. Started back driving about two weeks ago.   PAIN:  Are you having pain? Yes: NPRS scale: 2/10 Pain location: abdomen to R leg Pain description: spasm Aggravating factors: unknown Relieving factors: n/a  PRECAUTIONS: Fall  WEIGHT BEARING RESTRICTIONS No  FALLS: Has patient fallen in last 6 months? No  LIVING ENVIRONMENT: Lives with: lives with their family Lives in: House/apartment Stairs: Yes: Internal: full flight steps; but lives fully on first floor Has following equipment at home: Gilford Rile - 2 wheeled, Wheelchair (manual), shower chair, Grab bars, and Ramped entry  PLOF: Independent with household mobility with device  PATIENT GOALS be able to get into the bed without  assistance, improve ambulation, standing tolerance, going up steps.   OBJECTIVE:   DIAGNOSTIC FINDINGS: MRI of the brain 03/18/2020 showed multiple T2/FLAIR hyperintense foci in the periventricular, juxtacortical and deep white  matter.  There were no infratentorial lesions noted.  None of the foci enhanced.   Due to severe claustrophobia, the study was done with conscious sedation in the hospital  MRI of the cervical spine 03/18/2020 showed T2 hyperintense foci at C1-C2, C2-C3, C4-C5, C5-C6 and T1.  There is a disc protrusion at C5-C6 to the left and a larger disc protrusion at C6-C7 to the right that distorts the thecal sac.  The adjacent spinal cord has normal signal.  There could be compression of the right C7 nerve root.   LOWER EXTREMITY MMT:    MMT Right Eval Left Eval  Hip flexion 2- 2  Hip extension 2- 2  Hip abduction 2+ 2+  Hip adduction 2+ 2+  Knee flexion 2- 2  Knee extension 2 2  Ankle dorsiflexion trace trace  Ankle plantarflexion trace trace  (Blank rows = not tested)   TODAY'S TREATMENT:   Treatment: Ambulate with BRW and CGA with wheelchair follow, 60 ft, then additional 20 ft.  Cues for shifting weight and lifting limbs for foot clearance tolerated well.     5lb bar -chest press 10x -bicep curl  10x -overhead press 10x -witches brew 10x each direction Pectoral stretch to arnold position 10x   standing at bar:  Sort letters into color coded areas. Patient requires three rest breaks.    PATIENT EDUCATION: Education details: goals, POC, outcome measures Person educated: Patient Education method: Explanation, Demonstration, Tactile cues, and Verbal cues Education comprehension: verbalized understanding, returned demonstration, verbal cues required, and tactile cues required   HOME EXERCISE PROGRAM: Give next session  GOALS: Goals reviewed with patient? Yes  SHORT TERM GOALS: Target date: 06/27/2022    Patient will be independent in home exercise  program to improve strength/mobility for better functional independence with ADLs. Baseline:11/9: compliant 12/19: HEP compliant  Goal status: Partially Met  LONG TERM GOALS: Target date: 08/08/2022    Patient will increase FOTO score to equal to or greater than  52%   to demonstrate statistically significant improvement in mobility and quality of life Baseline: 10/10: 42% 11/9: 40% 12/19: 40% 1/9: 36% 2/1: 44%  3/7: 44% Goal status: in progress  2.  Patient (> 40 years old) will complete five times sit to stand test in < 15 seconds indicating an increased LE strength and improved balance. Baseline: 10/10: 38 seconds with one plop heavy UE support 11/9: 26 seconds with heavy BUE support 12/19: 37 seconds with BUE support 1/9: unable to perform due to weakness s/p procedure 1/11: 29.2 seconds  2/1: 27 seconds with BUE support 3/7: 23.37 second heavy BUE support  Goal status: In progress  3.  Patient will increase 10 meter walk test to >0.5 m/s as to improve gait speed for better community ambulation and to reduce fall risk. Baseline: 10/10: 54 seconds with bRW andw/c follow 11/9: 43.3 seconds with RW and w/c follow 12/19: 40.9 seconds with RW and w/c follow 1/9: unable to perform due to weakness s/p procedure 1/11: 47 seconds with RW and w/c follow 2/1: 42.58 seconds with RW and w/c follow 3/7: 45 seconds with bRW and w/c follow  Goal status: In progress  4.  Patient will be able to get into bed independently with use of railing to increase independence with mobility and nighttime routine.  Baseline: 10/10: needs complete assistance to bring RLE into bed. 11/9: needs assistance with leg; 12/19: min a for RLE 2/1: min A for RLE getting into bed 3/7: halfway there per  patient report  Goal status: In progress  5.  Patient will go up 2 small steps to enter sisters house with min A.  Baseline: 10/10: unable to perform 11/9: unable to perform on 2 " step  12/18: unable to perform 1/9: unable to  perform due to weakness s/p procedure 2/1: unable to perform  Goal status: Defer  6.  Patient will be able to transfer from standard chair heights and booths for community mobility.  Baseline: 1/9: unable 2/1: has not had a chance to do yet 3/7: not had an opportunity yet Goal status: Ongoing.  ASSESSMENT:  CLINICAL IMPRESSION: Patient continues to require rest breaks due to recovery from illness. Patient is highly motivated for progress of care and requires cueing for rest due to instability in standing. She continues to progress her UE strengthening and core stabilization. Patient will continue to benefit from skilled physical therapy intervention to address impairments, improve QOL, and attain therapy goals.    OBJECTIVE IMPAIRMENTS Abnormal gait, decreased activity tolerance, decreased balance, decreased coordination, decreased endurance, decreased mobility, difficulty walking, decreased ROM, decreased strength, hypomobility, increased edema, impaired perceived functional ability, increased muscle spasms, impaired flexibility, impaired tone, impaired UE functional use, improper body mechanics, postural dysfunction, obesity, and pain.   ACTIVITY LIMITATIONS carrying, lifting, bending, standing, squatting, sleeping, stairs, transfers, bed mobility, bathing, toileting, dressing, reach over head, locomotion level, and caring for others  PARTICIPATION LIMITATIONS: meal prep, cleaning, laundry, shopping, community activity, and yard work  PERSONAL FACTORS Age, Fitness, Past/current experiences, Sex, Social background, Time since onset of injury/illness/exacerbation, Transportation, and 3+ comorbidities: back pain, CBP, chronic L shoulder pain, galactorrhea, neuropathy, HPV, hypercholestermeia, HTN, MS, osteopenia, PONV, wrist fracture  are also affecting patient's functional outcome.   REHAB POTENTIAL: Fair    CLINICAL DECISION MAKING: Evolving/moderate complexity  EVALUATION COMPLEXITY:  Moderate  PLAN: PT FREQUENCY: 2x/week  PT DURATION: 12 weeks  PLANNED INTERVENTIONS: Therapeutic exercises, Therapeutic activity, Neuromuscular re-education, Balance training, Gait training, Patient/Family education, Self Care, Joint mobilization, Stair training, Vestibular training, Canalith repositioning, Visual/preceptual remediation/compensation, Orthotic/Fit training, DME instructions, Dry Needling, Electrical stimulation, Wheelchair mobility training, Spinal mobilization, Cryotherapy, Moist heat, Taping, Vasopneumatic device, Traction, Ultrasound, Ionotophoresis '4mg'$ /ml Dexamethasone, Manual therapy, and Re-evaluation  PLAN FOR NEXT SESSION: standing balance and strengthening, work on ambulation of decline and standing balance with decline.      Janna Arch, PT 07/20/2022, 10:54 AM

## 2022-07-20 ENCOUNTER — Ambulatory Visit: Payer: PPO

## 2022-07-20 DIAGNOSIS — R269 Unspecified abnormalities of gait and mobility: Secondary | ICD-10-CM

## 2022-07-20 DIAGNOSIS — R262 Difficulty in walking, not elsewhere classified: Secondary | ICD-10-CM

## 2022-07-20 DIAGNOSIS — R2681 Unsteadiness on feet: Secondary | ICD-10-CM

## 2022-07-20 DIAGNOSIS — M6281 Muscle weakness (generalized): Secondary | ICD-10-CM

## 2022-07-22 ENCOUNTER — Other Ambulatory Visit: Payer: Self-pay | Admitting: Neurology

## 2022-07-22 ENCOUNTER — Encounter: Payer: Self-pay | Admitting: Neurology

## 2022-07-24 ENCOUNTER — Other Ambulatory Visit: Payer: Self-pay

## 2022-07-24 MED ORDER — TIZANIDINE HCL 4 MG PO TABS: ORAL_TABLET | ORAL | 1 refills | Status: DC

## 2022-07-24 NOTE — Therapy (Signed)
OUTPATIENT PHYSICAL THERAPY NEURO TREATMENT  Patient Name: Lisa Williams MRN: [id number] DOB:April 15, 1961, 62 y.o., female Today's Date: 07/25/2022  PCP: Kirk Ruths  REFERRING PROVIDER: Britt Bottom MD   PT End of Session - 07/25/22 1015     Visit Number 43    Number of Visits 48    Date for PT Re-Evaluation 08/08/22    PT Start Time [id number]    PT Stop Time 1059    PT Time Calculation (min) 44 min    Equipment Utilized During Treatment Gait belt    Activity Tolerance Patient tolerated treatment well;Patient limited by fatigue    Behavior During Therapy [facility] for tasks assessed/performed                                   Past Medical History:  Diagnosis Date   Abdominal pain, right upper quadrant    Back pain    Calculus of kidney 12/09/2013   Chronic back pain    unspecified   Chronic left shoulder pain [id number]   Complication of anesthesia    Functional disorder of bladder    other   Galactorrhea 11/26/2014   Chronic    Hereditary and idiopathic neuropathy 08/19/2013   History of kidney stones    HPV test positive    Hypercholesteremia 08/19/2013   Hypertension    Incomplete bladder emptying    Microscopic hematuria    MS (multiple sclerosis) (HCC)    Muscle spasticity 05/21/2014   Nonspecific findings on examination of urine    other   Osteopenia    PONV (postoperative nausea and vomiting)    Status post laparoscopic supracervical hysterectomy 11/26/2014   Tobacco user 11/26/2014   Wrist fracture    Past Surgical History:  Procedure Laterality Date   bilateral tubal ligation  1996   BREAST CYST EXCISION Left 2002   CYST EXCISION Left 05/10/2022   Procedure: CYST REMOVAL;  Surgeon: Herbert Pun, MD;  Location: [facility];  Service: General;  Laterality: Left;   FRACTURE SURGERY     KNEE SURGERY     right   LAPAROSCOPIC SUPRACERVICAL HYSTERECTOMY  08/05/2013   ORIF WRIST FRACTURE Left 01/17/2017    Procedure: OPEN REDUCTION INTERNAL FIXATION (ORIF) WRIST FRACTURE;  Surgeon: Lovell Sheehan, MD;  Location: [facility];  Service: Orthopedics;  Laterality: Left;   RADIOLOGY WITH ANESTHESIA N/A 03/18/2020   Procedure: MRI WITH ANESTHESIA CERVICAL SPINE AND BRAIN  WITH AND WITHOUT CONTRAST;  Surgeon: Radiologist, Medication, MD;  Location: [place];  Service: Radiology;  Laterality: N/A;   TUBAL LIGATION Bilateral    VAGINAL HYSTERECTOMY  03/2006   Patient Active Problem List   Diagnosis Date Noted   Abscess of left groin 11/15/2021   Abnormal LFTs 11/15/2021   Acute respiratory disease due to COVID-19 virus 11/21/2020   Weakness    Hypoalbuminemia due to protein-calorie malnutrition [facility])    Neurogenic bowel    Neurogenic bladder    Labile blood pressure    Neuropathic pain    Abscess of female pelvis    SVT (supraventricular tachycardia)    Radial styloid tenosynovitis 03/12/2018   Wheelchair confinement 02/27/2018   Localized osteoporosis with current pathological fracture with routine healing 01/19/2017   Wrist fracture 01/16/2017   Sprain of ankle 03/23/2016   Closed fracture of lateral malleolus 03/16/2016   Health care maintenance 01/24/2016   Blood pressure elevated without history of HTN  10/25/2015   Essential hypertension 10/25/2015   Multiple sclerosis ([place]) 10/02/2015   Chronic left shoulder pain 07/19/2015   Multiple sclerosis exacerbation ([place]) 07/14/2015   MS (multiple sclerosis) ([place]) 11/26/2014   Increased body mass index 11/26/2014   HPV test positive 11/26/2014   Status post laparoscopic supracervical hysterectomy 11/26/2014   Galactorrhea 11/26/2014   Back ache 05/21/2014   Adiposity 05/21/2014   Disordered sleep 05/21/2014   Muscle spasticity 05/21/2014   Spasticity 05/21/2014   Calculus of kidney 33/82/5053   Renal colic 97/67/3419   Hypercholesteremia 08/19/2013   Hereditary and idiopathic neuropathy 08/19/2013   Hypercholesterolemia without  hypertriglyceridemia 08/19/2013   Bladder infection, chronic 07/25/2012   Disorder of bladder function 07/25/2012   Incomplete bladder emptying 07/25/2012   Microscopic hematuria 07/25/2012   Right upper quadrant pain 07/25/2012   ONSET DATE: 1995  REFERRING DIAG: MS  THERAPY DIAG:  Muscle weakness (generalized)  Difficulty in walking, not elsewhere classified  Unsteadiness on feet  Abnormality of gait and mobility  Rationale for Evaluation and Treatment Rehabilitation  SUBJECTIVE:                                                                                                                                                                                          SUBJECTIVE STATEMENT: Patient reports having good weekend. No falls or LOB since last session.  .   Pt accompanied by: self  PERTINENT HISTORY: Patient is retuning to PT after hospitalization on 11/14/21 where she then went to SNF and then received home health therapy which stopped 02/08/22. Patient has weakness in BLE with RLE>LLE. She drives with hand controls. Patient has been diagnosed with MS in 1995.  PMH includes: back pain, CBP, chronic L shoulder pain, galactorrhea, neuropathy, HPV, hypercholestermeia, HTN, MS, osteopenia, PONV, wrist fracture. Started back driving about two weeks ago.   PAIN:  Are you having pain? Yes: NPRS scale: 2/10 Pain location: abdomen to R leg Pain description: spasm Aggravating factors: unknown Relieving factors: n/a  PRECAUTIONS: Fall  WEIGHT BEARING RESTRICTIONS No  FALLS: Has patient fallen in last 6 months? No  LIVING ENVIRONMENT: Lives with: lives with their family Lives in: House/apartment Stairs: Yes: Internal: full flight steps; but lives fully on first floor Has following equipment at home: Gilford Rile - 2 wheeled, Wheelchair (manual), shower chair, Grab bars, and Ramped entry  PLOF: Independent with household mobility with device  PATIENT GOALS be able to get into the  bed without assistance, improve ambulation, standing tolerance, going up steps.   OBJECTIVE:   DIAGNOSTIC FINDINGS: MRI of the brain 03/18/2020 showed multiple T2/FLAIR hyperintense foci in the periventricular, juxtacortical  and deep white matter.  There were no infratentorial lesions noted.  None of the foci enhanced.   Due to severe claustrophobia, the study was done with conscious sedation in the hospital  MRI of the cervical spine 03/18/2020 showed T2 hyperintense foci at C1-C2, C2-C3, C4-C5, C5-C6 and T1.  There is a disc protrusion at C5-C6 to the left and a larger disc protrusion at C6-C7 to the right that distorts the thecal sac.  The adjacent spinal cord has normal signal.  There could be compression of the right C7 nerve root.   LOWER EXTREMITY MMT:    MMT Right Eval Left Eval  Hip flexion 2- 2  Hip extension 2- 2  Hip abduction 2+ 2+  Hip adduction 2+ 2+  Knee flexion 2- 2  Knee extension 2 2  Ankle dorsiflexion trace trace  Ankle plantarflexion trace trace  (Blank rows = not tested)   TODAY'S TREATMENT:   Treatment: Standing  Standing with bRW in front of speed bag: alternating tapping with UE; 10x each arm; sit down rest; x 3 sets total.    Seated boxing: cues for sequencing, body mechanics, and pace/rhythm for functional contraction and timing of muscle recruitment: -Cross body punches to mitts on PT hands with no back support for focused core stabilization with crossing midline 2x 60 seconds  -Cross body punch with secondary combination of elbow for full cross body rotation to PT mitt for trunk stability, coordination, and cardiovascular challenge x 60 seconds  Blue TB: -row 10x; 2 sets -overhead Y 10x -diagonal cross body D1 pattern 10x each side  Seated: Pectoral stretch to arnold position 10x    PATIENT EDUCATION: Education details: goals, POC, outcome measures Person educated: Patient Education method: Explanation, Demonstration, Tactile cues, and  Verbal cues Education comprehension: verbalized understanding, returned demonstration, verbal cues required, and tactile cues required   HOME EXERCISE PROGRAM: Give next session  GOALS: Goals reviewed with patient? Yes  SHORT TERM GOALS: Target date: 06/27/2022    Patient will be independent in home exercise program to improve strength/mobility for better functional independence with ADLs. Baseline:11/9: compliant 12/19: HEP compliant  Goal status: Partially Met  LONG TERM GOALS: Target date: 08/08/2022    Patient will increase FOTO score to equal to or greater than  52%   to demonstrate statistically significant improvement in mobility and quality of life Baseline: 10/10: 42% 11/9: 40% 12/19: 40% 1/9: 36% 2/1: 44%  3/7: 44% Goal status: in progress  2.  Patient (> 75 years old) will complete five times sit to stand test in < 15 seconds indicating an increased LE strength and improved balance. Baseline: 10/10: 38 seconds with one plop heavy UE support 11/9: 26 seconds with heavy BUE support 12/19: 37 seconds with BUE support 1/9: unable to perform due to weakness s/p procedure 1/11: 29.2 seconds  2/1: 27 seconds with BUE support 3/7: 23.37 second heavy BUE support  Goal status: In progress  3.  Patient will increase 10 meter walk test to >0.5 m/s as to improve gait speed for better community ambulation and to reduce fall risk. Baseline: 10/10: 54 seconds with bRW andw/c follow 11/9: 43.3 seconds with RW and w/c follow 12/19: 40.9 seconds with RW and w/c follow 1/9: unable to perform due to weakness s/p procedure 1/11: 47 seconds with RW and w/c follow 2/1: 42.58 seconds with RW and w/c follow 3/7: 45 seconds with bRW and w/c follow  Goal status: In progress  4.  Patient will be  able to get into bed independently with use of railing to increase independence with mobility and nighttime routine.  Baseline: 10/10: needs complete assistance to bring RLE into bed. 11/9: needs assistance  with leg; 12/19: min a for RLE 2/1: min A for RLE getting into bed 3/7: halfway there per patient report  Goal status: In progress  5.  Patient will go up 2 small steps to enter sisters house with min A.  Baseline: 10/10: unable to perform 11/9: unable to perform on 2 " step  12/18: unable to perform 1/9: unable to perform due to weakness s/p procedure 2/1: unable to perform  Goal status: Defer  6.  Patient will be able to transfer from standard chair heights and booths for community mobility.  Baseline: 1/9: unable 2/1: has not had a chance to do yet 3/7: not had an opportunity yet Goal status: Ongoing.  ASSESSMENT:  CLINICAL IMPRESSION: Patient presents with excellent motivation throughout session. Standing reaching with pertubation performed without LOB, only fatigue from prolonged standing. Seated boxing for stabilization with pertubation's in addition to strengthening tolerated well with increased UE strength noted.  Patient will continue to benefit from skilled physical therapy intervention to address impairments, improve QOL, and attain therapy goals.    OBJECTIVE IMPAIRMENTS Abnormal gait, decreased activity tolerance, decreased balance, decreased coordination, decreased endurance, decreased mobility, difficulty walking, decreased ROM, decreased strength, hypomobility, increased edema, impaired perceived functional ability, increased muscle spasms, impaired flexibility, impaired tone, impaired UE functional use, improper body mechanics, postural dysfunction, obesity, and pain.   ACTIVITY LIMITATIONS carrying, lifting, bending, standing, squatting, sleeping, stairs, transfers, bed mobility, bathing, toileting, dressing, reach over head, locomotion level, and caring for others  PARTICIPATION LIMITATIONS: meal prep, cleaning, laundry, shopping, community activity, and yard work  PERSONAL FACTORS Age, Fitness, Past/current experiences, Sex, Social background, Time since onset of  injury/illness/exacerbation, Transportation, and 3+ comorbidities: back pain, CBP, chronic L shoulder pain, galactorrhea, neuropathy, HPV, hypercholestermeia, HTN, MS, osteopenia, PONV, wrist fracture  are also affecting patient's functional outcome.   REHAB POTENTIAL: Fair    CLINICAL DECISION MAKING: Evolving/moderate complexity  EVALUATION COMPLEXITY: Moderate  PLAN: PT FREQUENCY: 2x/week  PT DURATION: 12 weeks  PLANNED INTERVENTIONS: Therapeutic exercises, Therapeutic activity, Neuromuscular re-education, Balance training, Gait training, Patient/Family education, Self Care, Joint mobilization, Stair training, Vestibular training, Canalith repositioning, Visual/preceptual remediation/compensation, Orthotic/Fit training, DME instructions, Dry Needling, Electrical stimulation, Wheelchair mobility training, Spinal mobilization, Cryotherapy, Moist heat, Taping, Vasopneumatic device, Traction, Ultrasound, Ionotophoresis 4mg /ml Dexamethasone, Manual therapy, and Re-evaluation  PLAN FOR NEXT SESSION: standing balance and strengthening, work on ambulation of decline and standing balance with decline.      Janna Arch, PT 07/25/2022, 11:10 AM

## 2022-07-24 NOTE — Telephone Encounter (Signed)
Last visit on 02/01/22 Follow up scheduled on 08/07/22

## 2022-07-25 ENCOUNTER — Ambulatory Visit: Payer: PPO

## 2022-07-25 DIAGNOSIS — R269 Unspecified abnormalities of gait and mobility: Secondary | ICD-10-CM

## 2022-07-25 DIAGNOSIS — M6281 Muscle weakness (generalized): Secondary | ICD-10-CM | POA: Diagnosis not present

## 2022-07-25 DIAGNOSIS — R262 Difficulty in walking, not elsewhere classified: Secondary | ICD-10-CM

## 2022-07-25 DIAGNOSIS — R2681 Unsteadiness on feet: Secondary | ICD-10-CM

## 2022-07-26 NOTE — Therapy (Signed)
OUTPATIENT PHYSICAL THERAPY NEURO TREATMENT  Patient Name: Lisa Williams MRN: [id number] DOB:1960-09-26, 62 y.o., female Today's Date: 07/27/2022  PCP: Kirk Ruths  REFERRING PROVIDER: Britt Bottom MD   PT End of Session - 07/27/22 0955     Visit Number 44    Number of Visits 48    Date for PT Re-Evaluation 08/08/22    PT Start Time [id number]    PT Stop Time 1058    PT Time Calculation (min) 43 min    Equipment Utilized During Treatment Gait belt    Activity Tolerance Patient tolerated treatment well;Patient limited by fatigue    Behavior During Therapy [facility] for tasks assessed/performed                                    Past Medical History:  Diagnosis Date   Abdominal pain, right upper quadrant    Back pain    Calculus of kidney 12/09/2013   Chronic back pain    unspecified   Chronic left shoulder pain [id number]   Complication of anesthesia    Functional disorder of bladder    other   Galactorrhea 11/26/2014   Chronic    Hereditary and idiopathic neuropathy 08/19/2013   History of kidney stones    HPV test positive    Hypercholesteremia 08/19/2013   Hypertension    Incomplete bladder emptying    Microscopic hematuria    MS (multiple sclerosis) (HCC)    Muscle spasticity 05/21/2014   Nonspecific findings on examination of urine    other   Osteopenia    PONV (postoperative nausea and vomiting)    Status post laparoscopic supracervical hysterectomy 11/26/2014   Tobacco user 11/26/2014   Wrist fracture    Past Surgical History:  Procedure Laterality Date   bilateral tubal ligation  1996   BREAST CYST EXCISION Left 2002   CYST EXCISION Left 05/10/2022   Procedure: CYST REMOVAL;  Surgeon: Herbert Pun, MD;  Location: [facility];  Service: General;  Laterality: Left;   FRACTURE SURGERY     KNEE SURGERY     right   LAPAROSCOPIC SUPRACERVICAL HYSTERECTOMY  08/05/2013   ORIF WRIST FRACTURE Left 01/17/2017    Procedure: OPEN REDUCTION INTERNAL FIXATION (ORIF) WRIST FRACTURE;  Surgeon: Lovell Sheehan, MD;  Location: [facility];  Service: Orthopedics;  Laterality: Left;   RADIOLOGY WITH ANESTHESIA N/A 03/18/2020   Procedure: MRI WITH ANESTHESIA CERVICAL SPINE AND BRAIN  WITH AND WITHOUT CONTRAST;  Surgeon: Radiologist, Medication, MD;  Location: [facility];  Service: Radiology;  Laterality: N/A;   TUBAL LIGATION Bilateral    VAGINAL HYSTERECTOMY  03/2006   Patient Active Problem List   Diagnosis Date Noted   Abscess of left groin 11/15/2021   Abnormal LFTs 11/15/2021   Acute respiratory disease due to COVID-19 virus 11/21/2020   Weakness    Hypoalbuminemia due to protein-calorie malnutrition [facility])    Neurogenic bowel    Neurogenic bladder    Labile blood pressure    Neuropathic pain    Abscess of female pelvis    SVT (supraventricular tachycardia)    Radial styloid tenosynovitis 03/12/2018   Wheelchair confinement 02/27/2018   Localized osteoporosis with current pathological fracture with routine healing 01/19/2017   Wrist fracture 01/16/2017   Sprain of ankle 03/23/2016   Closed fracture of lateral malleolus 03/16/2016   Health care maintenance 01/24/2016   Blood pressure elevated without history of  HTN 10/25/2015   Essential hypertension 10/25/2015   Multiple sclerosis ([place]) 10/02/2015   Chronic left shoulder pain 07/19/2015   Multiple sclerosis exacerbation ([place]) 07/14/2015   MS (multiple sclerosis) ([place]) 11/26/2014   Increased body mass index 11/26/2014   HPV test positive 11/26/2014   Status post laparoscopic supracervical hysterectomy 11/26/2014   Galactorrhea 11/26/2014   Back ache 05/21/2014   Adiposity 05/21/2014   Disordered sleep 05/21/2014   Muscle spasticity 05/21/2014   Spasticity 05/21/2014   Calculus of kidney [id number]   Renal colic [id number]   Hypercholesteremia 08/19/2013   Hereditary and idiopathic neuropathy 08/19/2013   Hypercholesterolemia without  hypertriglyceridemia 08/19/2013   Bladder infection, chronic 07/25/2012   Disorder of bladder function 07/25/2012   Incomplete bladder emptying 07/25/2012   Microscopic hematuria 07/25/2012   Right upper quadrant pain 07/25/2012   ONSET DATE: 1995  REFERRING DIAG: MS  THERAPY DIAG:  Muscle weakness (generalized)  Difficulty in walking, not elsewhere classified  Unsteadiness on feet  Abnormality of gait and mobility  Rationale for Evaluation and Treatment Rehabilitation  SUBJECTIVE:                                                                                                                                                                                          SUBJECTIVE STATEMENT: Patient reports she had spicy foot unintentionally last night and it hurt her stomach.  .   Pt accompanied by: self  PERTINENT HISTORY: Patient is retuning to PT after hospitalization on 11/14/21 where she then went to SNF and then received home health therapy which stopped 02/08/22. Patient has weakness in BLE with RLE>LLE. She drives with hand controls. Patient has been diagnosed with MS in 1995.  PMH includes: back pain, CBP, chronic L shoulder pain, galactorrhea, neuropathy, HPV, hypercholestermeia, HTN, MS, osteopenia, PONV, wrist fracture. Started back driving about two weeks ago.   PAIN:  Are you having pain? Yes: NPRS scale: 2/10 Pain location: abdomen to R leg Pain description: spasm Aggravating factors: unknown Relieving factors: n/a  PRECAUTIONS: Fall  WEIGHT BEARING RESTRICTIONS No  FALLS: Has patient fallen in last 6 months? No  LIVING ENVIRONMENT: Lives with: lives with their family Lives in: House/apartment Stairs: Yes: Internal: full flight steps; but lives fully on first floor Has following equipment at home: Gilford Rile - 2 wheeled, Wheelchair (manual), shower chair, Grab bars, and Ramped entry  PLOF: Independent with household mobility with device  PATIENT GOALS be able to  get into the bed without assistance, improve ambulation, standing tolerance, going up steps.   OBJECTIVE:   DIAGNOSTIC FINDINGS: MRI of the brain 03/18/2020 showed multiple T2/FLAIR hyperintense foci in  the periventricular, juxtacortical and deep white matter.  There were no infratentorial lesions noted.  None of the foci enhanced.   Due to severe claustrophobia, the study was done with conscious sedation in the hospital  MRI of the cervical spine 03/18/2020 showed T2 hyperintense foci at C1-C2, C2-C3, C4-C5, C5-C6 and T1.  There is a disc protrusion at C5-C6 to the left and a larger disc protrusion at C6-C7 to the right that distorts the thecal sac.  The adjacent spinal cord has normal signal.  There could be compression of the right C7 nerve root.   LOWER EXTREMITY MMT:    MMT Right Eval Left Eval  Hip flexion 2- 2  Hip extension 2- 2  Hip abduction 2+ 2+  Hip adduction 2+ 2+  Knee flexion 2- 2  Knee extension 2 2  Ankle dorsiflexion trace trace  Ankle plantarflexion trace trace  (Blank rows = not tested)   TODAY'S TREATMENT:    Treatment: Ambulate 40 ft Cues for shifting weight and lifting limbs for foot clearance tolerated well.  Terminated due to R radiating pain down abdomen to leg to knee. Reports feels like a burning.    Seated: Adduction AAROM 10x 10 second holds each LE with PT holding patient leg Abduction RTB 15x  Adduction RTB 15x RTB row 15x RTB overhead Y 15x Lateral stretch for core 2x30 seconds each side       PATIENT EDUCATION: Education details: goals, POC, outcome measures Person educated: Patient Education method: Explanation, Demonstration, Tactile cues, and Verbal cues Education comprehension: verbalized understanding, returned demonstration, verbal cues required, and tactile cues required   HOME EXERCISE PROGRAM: Give next session  GOALS: Goals reviewed with patient? Yes  SHORT TERM GOALS: Target date: 06/27/2022    Patient will be  independent in home exercise program to improve strength/mobility for better functional independence with ADLs. Baseline:11/9: compliant 12/19: HEP compliant  Goal status: Partially Met  LONG TERM GOALS: Target date: 08/08/2022    Patient will increase FOTO score to equal to or greater than  52%   to demonstrate statistically significant improvement in mobility and quality of life Baseline: 10/10: 42% 11/9: 40% 12/19: 40% 1/9: 36% 2/1: 44%  3/7: 44% Goal status: in progress  2.  Patient (> 97 years old) will complete five times sit to stand test in < 15 seconds indicating an increased LE strength and improved balance. Baseline: 10/10: 38 seconds with one plop heavy UE support 11/9: 26 seconds with heavy BUE support 12/19: 37 seconds with BUE support 1/9: unable to perform due to weakness s/p procedure 1/11: 29.2 seconds  2/1: 27 seconds with BUE support 3/7: 23.37 second heavy BUE support  Goal status: In progress  3.  Patient will increase 10 meter walk test to >0.5 m/s as to improve gait speed for better community ambulation and to reduce fall risk. Baseline: 10/10: 54 seconds with bRW andw/c follow 11/9: 43.3 seconds with RW and w/c follow 12/19: 40.9 seconds with RW and w/c follow 1/9: unable to perform due to weakness s/p procedure 1/11: 47 seconds with RW and w/c follow 2/1: 42.58 seconds with RW and w/c follow 3/7: 45 seconds with bRW and w/c follow  Goal status: In progress  4.  Patient will be able to get into bed independently with use of railing to increase independence with mobility and nighttime routine.  Baseline: 10/10: needs complete assistance to bring RLE into bed. 11/9: needs assistance with leg; 12/19: min a for RLE 2/1:  min A for RLE getting into bed 3/7: halfway there per patient report  Goal status: In progress  5.  Patient will go up 2 small steps to enter sisters house with min A.  Baseline: 10/10: unable to perform 11/9: unable to perform on 2 " step  12/18: unable to  perform 1/9: unable to perform due to weakness s/p procedure 2/1: unable to perform  Goal status: Defer  6.  Patient will be able to transfer from standard chair heights and booths for community mobility.  Baseline: 1/9: unable 2/1: has not had a chance to do yet 3/7: not had an opportunity yet Goal status: Ongoing.  ASSESSMENT:  CLINICAL IMPRESSION: Patient session limited today. Ambulation terminated due to R radiating pain down abdomen to leg to knee. Reports feels like a burning. Upset stomach prevented core activation in seated this session. Despite these issues patient remains highly motivated.  Patient will continue to benefit from skilled physical therapy intervention to address impairments, improve QOL, and attain therapy goals.    OBJECTIVE IMPAIRMENTS Abnormal gait, decreased activity tolerance, decreased balance, decreased coordination, decreased endurance, decreased mobility, difficulty walking, decreased ROM, decreased strength, hypomobility, increased edema, impaired perceived functional ability, increased muscle spasms, impaired flexibility, impaired tone, impaired UE functional use, improper body mechanics, postural dysfunction, obesity, and pain.   ACTIVITY LIMITATIONS carrying, lifting, bending, standing, squatting, sleeping, stairs, transfers, bed mobility, bathing, toileting, dressing, reach over head, locomotion level, and caring for others  PARTICIPATION LIMITATIONS: meal prep, cleaning, laundry, shopping, community activity, and yard work  PERSONAL FACTORS Age, Fitness, Past/current experiences, Sex, Social background, Time since onset of injury/illness/exacerbation, Transportation, and 3+ comorbidities: back pain, CBP, chronic L shoulder pain, galactorrhea, neuropathy, HPV, hypercholestermeia, HTN, MS, osteopenia, PONV, wrist fracture  are also affecting patient's functional outcome.   REHAB POTENTIAL: Fair    CLINICAL DECISION MAKING: Evolving/moderate  complexity  EVALUATION COMPLEXITY: Moderate  PLAN: PT FREQUENCY: 2x/week  PT DURATION: 12 weeks  PLANNED INTERVENTIONS: Therapeutic exercises, Therapeutic activity, Neuromuscular re-education, Balance training, Gait training, Patient/Family education, Self Care, Joint mobilization, Stair training, Vestibular training, Canalith repositioning, Visual/preceptual remediation/compensation, Orthotic/Fit training, DME instructions, Dry Needling, Electrical stimulation, Wheelchair mobility training, Spinal mobilization, Cryotherapy, Moist heat, Taping, Vasopneumatic device, Traction, Ultrasound, Ionotophoresis 4mg /ml Dexamethasone, Manual therapy, and Re-evaluation  PLAN FOR NEXT SESSION: standing balance and strengthening, work on ambulation of decline and standing balance with decline.      Janna Arch, PT 07/27/2022, 11:02 AM

## 2022-07-27 ENCOUNTER — Ambulatory Visit: Payer: PPO

## 2022-07-27 DIAGNOSIS — Z6836 Body mass index (BMI) 36.0-36.9, adult: Secondary | ICD-10-CM | POA: Diagnosis not present

## 2022-07-27 DIAGNOSIS — Z888 Allergy status to other drugs, medicaments and biological substances status: Secondary | ICD-10-CM | POA: Diagnosis not present

## 2022-07-27 DIAGNOSIS — F40240 Claustrophobia: Secondary | ICD-10-CM | POA: Diagnosis not present

## 2022-07-27 DIAGNOSIS — M21371 Foot drop, right foot: Secondary | ICD-10-CM | POA: Diagnosis not present

## 2022-07-27 DIAGNOSIS — R531 Weakness: Secondary | ICD-10-CM | POA: Diagnosis not present

## 2022-07-27 DIAGNOSIS — Z800 Family history of malignant neoplasm of digestive organs: Secondary | ICD-10-CM | POA: Diagnosis not present

## 2022-07-27 DIAGNOSIS — M6281 Muscle weakness (generalized): Secondary | ICD-10-CM | POA: Diagnosis not present

## 2022-07-27 DIAGNOSIS — G609 Hereditary and idiopathic neuropathy, unspecified: Secondary | ICD-10-CM | POA: Diagnosis not present

## 2022-07-27 DIAGNOSIS — Z841 Family history of disorders of kidney and ureter: Secondary | ICD-10-CM | POA: Diagnosis not present

## 2022-07-27 DIAGNOSIS — R262 Difficulty in walking, not elsewhere classified: Secondary | ICD-10-CM

## 2022-07-27 DIAGNOSIS — R269 Unspecified abnormalities of gait and mobility: Secondary | ICD-10-CM

## 2022-07-27 DIAGNOSIS — N319 Neuromuscular dysfunction of bladder, unspecified: Secondary | ICD-10-CM | POA: Diagnosis not present

## 2022-07-27 DIAGNOSIS — E7800 Pure hypercholesterolemia, unspecified: Secondary | ICD-10-CM | POA: Diagnosis not present

## 2022-07-27 DIAGNOSIS — Z8249 Family history of ischemic heart disease and other diseases of the circulatory system: Secondary | ICD-10-CM | POA: Diagnosis not present

## 2022-07-27 DIAGNOSIS — R41841 Cognitive communication deficit: Secondary | ICD-10-CM | POA: Diagnosis not present

## 2022-07-27 DIAGNOSIS — R1311 Dysphagia, oral phase: Secondary | ICD-10-CM | POA: Diagnosis not present

## 2022-07-27 DIAGNOSIS — E669 Obesity, unspecified: Secondary | ICD-10-CM | POA: Diagnosis not present

## 2022-07-27 DIAGNOSIS — Z8041 Family history of malignant neoplasm of ovary: Secondary | ICD-10-CM | POA: Diagnosis not present

## 2022-07-27 DIAGNOSIS — Z8042 Family history of malignant neoplasm of prostate: Secondary | ICD-10-CM | POA: Diagnosis not present

## 2022-07-27 DIAGNOSIS — R2681 Unsteadiness on feet: Secondary | ICD-10-CM

## 2022-07-27 DIAGNOSIS — M8580 Other specified disorders of bone density and structure, unspecified site: Secondary | ICD-10-CM | POA: Diagnosis not present

## 2022-07-27 DIAGNOSIS — Z79899 Other long term (current) drug therapy: Secondary | ICD-10-CM | POA: Diagnosis not present

## 2022-07-27 DIAGNOSIS — Z833 Family history of diabetes mellitus: Secondary | ICD-10-CM | POA: Diagnosis not present

## 2022-07-27 DIAGNOSIS — G8929 Other chronic pain: Secondary | ICD-10-CM | POA: Diagnosis not present

## 2022-07-27 DIAGNOSIS — Z87891 Personal history of nicotine dependence: Secondary | ICD-10-CM | POA: Diagnosis not present

## 2022-07-27 DIAGNOSIS — Z743 Need for continuous supervision: Secondary | ICD-10-CM | POA: Diagnosis not present

## 2022-07-27 DIAGNOSIS — R2689 Other abnormalities of gait and mobility: Secondary | ICD-10-CM | POA: Diagnosis not present

## 2022-07-27 DIAGNOSIS — G4489 Other headache syndrome: Secondary | ICD-10-CM | POA: Diagnosis not present

## 2022-07-27 DIAGNOSIS — M25512 Pain in left shoulder: Secondary | ICD-10-CM | POA: Diagnosis not present

## 2022-07-27 DIAGNOSIS — Z832 Family history of diseases of the blood and blood-forming organs and certain disorders involving the immune mechanism: Secondary | ICD-10-CM | POA: Diagnosis not present

## 2022-07-27 DIAGNOSIS — Z803 Family history of malignant neoplasm of breast: Secondary | ICD-10-CM | POA: Diagnosis not present

## 2022-07-27 DIAGNOSIS — I10 Essential (primary) hypertension: Secondary | ICD-10-CM | POA: Diagnosis not present

## 2022-07-27 DIAGNOSIS — E876 Hypokalemia: Secondary | ICD-10-CM | POA: Diagnosis not present

## 2022-07-27 DIAGNOSIS — G35 Multiple sclerosis: Secondary | ICD-10-CM | POA: Diagnosis not present

## 2022-07-29 ENCOUNTER — Encounter: Payer: Self-pay | Admitting: Internal Medicine

## 2022-07-29 ENCOUNTER — Inpatient Hospital Stay: Payer: PPO

## 2022-07-29 ENCOUNTER — Other Ambulatory Visit: Payer: Self-pay

## 2022-07-29 ENCOUNTER — Inpatient Hospital Stay
Admission: EM | Admit: 2022-07-29 | Discharge: 2022-08-02 | DRG: 060 | Disposition: A | Discharge status: Skilled Nursing Facility | Payer: PPO | Attending: Hospitalist | Admitting: Hospitalist

## 2022-07-29 DIAGNOSIS — Z800 Family history of malignant neoplasm of digestive organs: Secondary | ICD-10-CM | POA: Diagnosis not present

## 2022-07-29 DIAGNOSIS — Z841 Family history of disorders of kidney and ureter: Secondary | ICD-10-CM | POA: Diagnosis not present

## 2022-07-29 DIAGNOSIS — M21371 Foot drop, right foot: Secondary | ICD-10-CM | POA: Diagnosis present

## 2022-07-29 DIAGNOSIS — Z8042 Family history of malignant neoplasm of prostate: Secondary | ICD-10-CM | POA: Diagnosis not present

## 2022-07-29 DIAGNOSIS — F40240 Claustrophobia: Secondary | ICD-10-CM | POA: Diagnosis present

## 2022-07-29 DIAGNOSIS — Z79899 Other long term (current) drug therapy: Secondary | ICD-10-CM | POA: Diagnosis not present

## 2022-07-29 DIAGNOSIS — G35 Multiple sclerosis: Secondary | ICD-10-CM | POA: Diagnosis present

## 2022-07-29 DIAGNOSIS — Z833 Family history of diabetes mellitus: Secondary | ICD-10-CM

## 2022-07-29 DIAGNOSIS — I10 Essential (primary) hypertension: Secondary | ICD-10-CM | POA: Diagnosis present

## 2022-07-29 DIAGNOSIS — E7800 Pure hypercholesterolemia, unspecified: Secondary | ICD-10-CM | POA: Diagnosis present

## 2022-07-29 DIAGNOSIS — Z6836 Body mass index (BMI) 36.0-36.9, adult: Secondary | ICD-10-CM

## 2022-07-29 DIAGNOSIS — M25512 Pain in left shoulder: Secondary | ICD-10-CM | POA: Diagnosis present

## 2022-07-29 DIAGNOSIS — M8580 Other specified disorders of bone density and structure, unspecified site: Secondary | ICD-10-CM | POA: Diagnosis present

## 2022-07-29 DIAGNOSIS — Z888 Allergy status to other drugs, medicaments and biological substances status: Secondary | ICD-10-CM | POA: Diagnosis not present

## 2022-07-29 DIAGNOSIS — Z83438 Family history of other disorder of lipoprotein metabolism and other lipidemia: Secondary | ICD-10-CM

## 2022-07-29 DIAGNOSIS — E669 Obesity, unspecified: Secondary | ICD-10-CM | POA: Diagnosis present

## 2022-07-29 DIAGNOSIS — G8929 Other chronic pain: Secondary | ICD-10-CM | POA: Diagnosis present

## 2022-07-29 DIAGNOSIS — Z8249 Family history of ischemic heart disease and other diseases of the circulatory system: Secondary | ICD-10-CM | POA: Diagnosis not present

## 2022-07-29 DIAGNOSIS — Z87891 Personal history of nicotine dependence: Secondary | ICD-10-CM

## 2022-07-29 DIAGNOSIS — G35D Multiple sclerosis, unspecified: Secondary | ICD-10-CM | POA: Diagnosis present

## 2022-07-29 DIAGNOSIS — N319 Neuromuscular dysfunction of bladder, unspecified: Secondary | ICD-10-CM | POA: Diagnosis present

## 2022-07-29 DIAGNOSIS — G609 Hereditary and idiopathic neuropathy, unspecified: Secondary | ICD-10-CM | POA: Diagnosis present

## 2022-07-29 DIAGNOSIS — Z832 Family history of diseases of the blood and blood-forming organs and certain disorders involving the immune mechanism: Secondary | ICD-10-CM | POA: Diagnosis not present

## 2022-07-29 DIAGNOSIS — R29898 Other symptoms and signs involving the musculoskeletal system: Secondary | ICD-10-CM | POA: Insufficient documentation

## 2022-07-29 DIAGNOSIS — Z803 Family history of malignant neoplasm of breast: Secondary | ICD-10-CM

## 2022-07-29 DIAGNOSIS — E876 Hypokalemia: Secondary | ICD-10-CM | POA: Insufficient documentation

## 2022-07-29 DIAGNOSIS — T733XXA Exhaustion due to excessive exertion, initial encounter: Secondary | ICD-10-CM

## 2022-07-29 DIAGNOSIS — R531 Weakness: Secondary | ICD-10-CM | POA: Diagnosis not present

## 2022-07-29 DIAGNOSIS — Z8041 Family history of malignant neoplasm of ovary: Secondary | ICD-10-CM | POA: Diagnosis not present

## 2022-07-29 DIAGNOSIS — R252 Cramp and spasm: Secondary | ICD-10-CM | POA: Diagnosis present

## 2022-07-29 LAB — CBC WITH DIFFERENTIAL/PLATELET
Abs Immature Granulocytes: 0.02 10*3/uL (ref 0.00–0.07)
Basophils Absolute: 0 10*3/uL (ref 0.0–0.1)
Basophils Relative: 0 %
Eosinophils Absolute: 0 10*3/uL (ref 0.0–0.5)
Eosinophils Relative: 1 %
HCT: 37.3 % (ref 36.0–46.0)
Hemoglobin: 13 g/dL (ref 12.0–15.0)
Immature Granulocytes: 0 %
Lymphocytes Relative: 4 %
Lymphs Abs: 0.3 10*3/uL — ABNORMAL LOW (ref 0.7–4.0)
MCH: 26.7 pg (ref 26.0–34.0)
MCHC: 34.9 g/dL (ref 30.0–36.0)
MCV: 76.7 fL — ABNORMAL LOW (ref 80.0–100.0)
Monocytes Absolute: 1 10*3/uL (ref 0.1–1.0)
Monocytes Relative: 13 %
Neutro Abs: 6.3 10*3/uL (ref 1.7–7.7)
Neutrophils Relative %: 82 %
Platelets: 242 10*3/uL (ref 150–400)
RBC: 4.86 MIL/uL (ref 3.87–5.11)
RDW: 13.9 % (ref 11.5–15.5)
WBC: 7.6 10*3/uL (ref 4.0–10.5)
nRBC: 0 % (ref 0.0–0.2)

## 2022-07-29 LAB — COMPREHENSIVE METABOLIC PANEL
ALT: 70 U/L — ABNORMAL HIGH (ref 0–44)
AST: 42 U/L — ABNORMAL HIGH (ref 15–41)
Albumin: 3.8 g/dL (ref 3.5–5.0)
Alkaline Phosphatase: 124 U/L (ref 38–126)
Anion gap: 7 (ref 5–15)
BUN: 20 mg/dL (ref 6–20)
CO2: 22 mmol/L (ref 22–32)
Calcium: 8.5 mg/dL — ABNORMAL LOW (ref 8.9–10.3)
Chloride: 111 mmol/L (ref 98–111)
Creatinine, Ser: 0.48 mg/dL (ref 0.44–1.00)
GFR, Estimated: >60 mL/min (ref >60)
Glucose, Bld: 93 mg/dL (ref 70–99)
Potassium: 3.2 mmol/L — ABNORMAL LOW (ref 3.5–5.1)
Sodium: 140 mmol/L (ref 135–145)
Total Bilirubin: 0.9 mg/dL (ref 0.3–1.2)
Total Protein: 6.7 g/dL (ref 6.5–8.1)

## 2022-07-29 LAB — MAGNESIUM: Magnesium: 2.3 mg/dL (ref 1.7–2.4)

## 2022-07-29 LAB — PHOSPHORUS: Phosphorus: 3.1 mg/dL (ref 2.5–4.6)

## 2022-07-29 MED ORDER — HYDRALAZINE HCL 10 MG PO TABS
10.0000 mg | ORAL_TABLET | Freq: Three times a day (TID) | ORAL | Status: DC | PRN
  Administered 2022-07-29: 10 mg via ORAL
  Filled 2022-07-29: qty 1

## 2022-07-29 MED ORDER — ACETAMINOPHEN 325 MG PO TABS: 650.0000 mg | ORAL_TABLET | Freq: Four times a day (QID) | ORAL | Status: DC | PRN

## 2022-07-29 MED ORDER — TIZANIDINE HCL 4 MG PO TABS
4.0000 mg | ORAL_TABLET | Freq: Two times a day (BID) | ORAL | Status: DC
  Administered 2022-07-30 – 2022-07-31 (×2): 4 mg via ORAL
  Filled 2022-07-29 (×3): qty 1

## 2022-07-29 MED ORDER — OXYBUTYNIN CHLORIDE ER 10 MG PO TB24
10.0000 mg | ORAL_TABLET | Freq: Every day | ORAL | Status: DC
  Administered 2022-07-30 – 2022-08-02 (×4): 10 mg via ORAL
  Filled 2022-07-29 (×4): qty 1

## 2022-07-29 MED ORDER — SODIUM CHLORIDE 0.9 % IV SOLN: 1000.0000 mg | INTRAVENOUS | Status: DC

## 2022-07-29 MED ORDER — MELATONIN 5 MG PO TABS: 5.0000 mg | ORAL_TABLET | Freq: Every evening | ORAL | Status: DC | PRN

## 2022-07-29 MED ORDER — SIPONIMOD FUMARATE 2 MG PO TABS: 2.0000 mg | ORAL_TABLET | Freq: Every day | ORAL | Status: DC

## 2022-07-29 MED ORDER — SODIUM CHLORIDE 0.9 % IV SOLN: 1000.0000 mg | Freq: Once | INTRAVENOUS | Status: DC

## 2022-07-29 MED ORDER — SODIUM CHLORIDE 0.9 % IV SOLN
1000.0000 mg | Freq: Once | INTRAVENOUS | Status: AC
  Administered 2022-07-29: 1000 mg via INTRAVENOUS
  Filled 2022-07-29: qty 16

## 2022-07-29 MED ORDER — HYDRALAZINE HCL 20 MG/ML IJ SOLN: 5.0000 mg | Freq: Three times a day (TID) | INTRAMUSCULAR | Status: DC | PRN

## 2022-07-29 MED ORDER — BACLOFEN 10 MG PO TABS
20.0000 mg | ORAL_TABLET | Freq: Three times a day (TID) | ORAL | Status: DC
  Administered 2022-07-29 – 2022-08-02 (×12): 20 mg via ORAL
  Filled 2022-07-29 (×12): qty 2

## 2022-07-29 MED ORDER — HYDRALAZINE HCL 10 MG PO TABS: 10.0000 mg | ORAL_TABLET | Freq: Three times a day (TID) | ORAL | Status: DC | PRN

## 2022-07-29 MED ORDER — SODIUM CHLORIDE 0.9 % IV SOLN: 1000.0000 mg | Freq: Every day | INTRAVENOUS | Status: DC

## 2022-07-29 MED ORDER — ENOXAPARIN SODIUM 60 MG/0.6ML IJ SOSY
0.5000 mg/kg | PREFILLED_SYRINGE | Freq: Every day | INTRAMUSCULAR | Status: DC
  Administered 2022-07-29 – 2022-08-01 (×4): 60 mg via SUBCUTANEOUS
  Filled 2022-07-29 (×4): qty 0.6

## 2022-07-29 MED ORDER — IRBESARTAN 150 MG PO TABS
150.0000 mg | ORAL_TABLET | Freq: Every day | ORAL | Status: DC
  Administered 2022-07-30 – 2022-08-02 (×4): 150 mg via ORAL
  Filled 2022-07-29 (×4): qty 1

## 2022-07-29 MED ORDER — SODIUM CHLORIDE 0.9 % IV SOLN
1000.0000 mg | INTRAVENOUS | Status: DC
  Administered 2022-07-30 – 2022-08-01 (×3): 1000 mg via INTRAVENOUS
  Filled 2022-07-29 (×3): qty 16

## 2022-07-29 MED ORDER — GABAPENTIN 100 MG PO CAPS
100.0000 mg | ORAL_CAPSULE | Freq: Two times a day (BID) | ORAL | Status: DC
  Administered 2022-07-29 – 2022-08-02 (×8): 100 mg via ORAL
  Filled 2022-07-29 (×8): qty 1

## 2022-07-29 MED ORDER — ONDANSETRON HCL 4 MG/2ML IJ SOLN: 4.0000 mg | Freq: Four times a day (QID) | INTRAMUSCULAR | Status: DC | PRN

## 2022-07-29 MED ORDER — SENNOSIDES-DOCUSATE SODIUM 8.6-50 MG PO TABS: 1.0000 | ORAL_TABLET | Freq: Every evening | ORAL | Status: DC | PRN

## 2022-07-29 MED ORDER — ACETAMINOPHEN 650 MG RE SUPP: 650.0000 mg | Freq: Four times a day (QID) | RECTAL | Status: DC | PRN

## 2022-07-29 MED ORDER — TIZANIDINE HCL 4 MG PO TABS
8.0000 mg | ORAL_TABLET | Freq: Every day | ORAL | Status: DC
  Administered 2022-07-29 – 2022-08-01 (×4): 8 mg via ORAL
  Filled 2022-07-29 (×4): qty 2

## 2022-07-29 MED ORDER — POTASSIUM CHLORIDE CRYS ER 20 MEQ PO TBCR
30.0000 meq | EXTENDED_RELEASE_TABLET | Freq: Once | ORAL | Status: AC
  Administered 2022-07-29: 30 meq via ORAL
  Filled 2022-07-29: qty 1

## 2022-07-29 MED ORDER — VITAMIN D 25 MCG (1000 UNIT) PO TABS
5000.0000 [IU] | ORAL_TABLET | Freq: Every day | ORAL | Status: DC
  Administered 2022-07-30 – 2022-08-02 (×4): 5000 [IU] via ORAL
  Filled 2022-07-29 (×4): qty 5

## 2022-07-29 MED ORDER — ONDANSETRON HCL 4 MG PO TABS: 4.0000 mg | ORAL_TABLET | Freq: Four times a day (QID) | ORAL | Status: DC | PRN

## 2022-07-29 NOTE — ED Notes (Signed)
Pt is refusing to let IV team pull labs from PIV. Pt states she wants the lab to come draw her labs. Spoke with Dr Corky Downs, will medicate pt without labs.

## 2022-07-29 NOTE — Assessment & Plan Note (Signed)
Check serum magnesium level Replace with potassium chloride 30 mEq p.o. one-time dose on admission Repeat BMP in the a.m.

## 2022-07-29 NOTE — ED Triage Notes (Signed)
Pt arrives via EMS w/c/o leg weakness and HA x last night. Pt states she has hx of MS and is having an exacerbation. Pt is being followed a neurologist in [place].

## 2022-07-29 NOTE — ED Notes (Signed)
Pt states she wants PIV in hand. Explained to pt that if any imaging was ordered she may have to have another PIV inserted. Pt still wants hand PIV.  Pt also asks for a [place], Pt has MS and is unable to ambulate at this time. Pt was offered the choice to use a bedpan.

## 2022-07-29 NOTE — Assessment & Plan Note (Signed)
Resumed home baclofen 20 mg p.o. 3 times daily, gabapentin 100 mg p.o. twice daily, home tizanidine dosing

## 2022-07-29 NOTE — ED Notes (Signed)
Pt agreed to labs.

## 2022-07-29 NOTE — Assessment & Plan Note (Addendum)
Status post Solu-Medrol 1000 mg IV one-time dose per EDP Ordered Solu-Medrol 1000 mg IV starting at 07/30/2022, 2 doses ordered

## 2022-07-29 NOTE — Assessment & Plan Note (Signed)
New and sudden onset Workup in progress, MRI of the brain without contrast has been ordered

## 2022-07-29 NOTE — ED Provider Notes (Signed)
[facility] Emergency Department Provider Note     Event Date/Time   First MD Initiated Contact with Patient 07/29/22 1520     (approximate)   History   Weakness and Headache (Pt arrives via EMS w/c/o leg weakness and HA x last night. Pt states she has hx of MS and is having an exacerbation. Pt is being followed a neurologist in [place].)   HPI  Lisa Williams is a 62 y.o. female with a 30+ year history of MS, HTN, osteopenia, leg weakness with AFOs bilaterally, presents to the ED for what she believes to be an exacerbation of her MS.  She reports 1 week of progressive leg weakness with near falls at home.  She reports her last exacerbation requiring admission was last year.  She denies any cough, congestion, chest pain, shortness of breath patient also denies any fevers, chills, or sweats.   Physical Exam   Triage Vital Signs: ED Triage Vitals  Enc Vitals Group     BP 07/29/22 1510 (!) 182/94     Pulse Rate 07/29/22 1510 80     Resp 07/29/22 1510 18     Temp 07/29/22 1510 98.8 F (37.1 C)     Temp Source 07/29/22 1510 Oral     SpO2 07/29/22 1510 98 %     Weight 07/29/22 1512 263 lb (119.3 kg)     Height 07/29/22 1512 5\' 11"  (1.803 m)     Head Circumference --      Peak Flow --      Pain Score 07/29/22 1512 0     Pain Loc --      Pain Edu? --      Excl. in [place]? --     Most recent vital signs: Vitals:   07/29/22 1510  BP: (!) 182/94  Pulse: 80  Resp: 18  Temp: 98.8 F (37.1 C)  SpO2: 98%    General Awake, no distress. NAD HEENT NCAT. PERRL. EOMI. No rhinorrhea. Mucous membranes are moist.  CV:  Good peripheral perfusion.  1+ pitting edema to the BLEs RESP:  Normal effort.  ABD:  No distention.  MSK:  Active range of motion of the upper and lower extremities.  AFOs tach to the bilateral lower extremities.   ED Results / Procedures / Treatments   Labs (all labs ordered are listed, but only abnormal results are  displayed) Labs Reviewed  CBC WITH DIFFERENTIAL/PLATELET - Abnormal; Notable for the following components:      Result Value   MCV 76.7 (*)    All other components within normal limits  COMPREHENSIVE METABOLIC PANEL  URINALYSIS, ROUTINE W REFLEX MICROSCOPIC  HIV ANTIBODY (ROUTINE TESTING W REFLEX)  PHOSPHORUS  MAGNESIUM  BASIC METABOLIC PANEL  CBC   EKG    RADIOLOGY  No results found.   PROCEDURES:  Critical Care performed: No  Procedures   MEDICATIONS ORDERED IN ED: Medications  methylPREDNISolone sodium succinate (SOLU-MEDROL) 1,000 mg in sodium chloride 0.9 % 50 mL IVPB (1,000 mg Intravenous New Bag/Given 07/29/22 1723)  gabapentin (NEURONTIN) capsule 100 mg (has no administration in time range)  acetaminophen (TYLENOL) tablet 650 mg (has no administration in time range)    Or  acetaminophen (TYLENOL) suppository 650 mg (has no administration in time range)  ondansetron (ZOFRAN) tablet 4 mg (has no administration in time range)    Or  ondansetron (ZOFRAN) injection 4 mg (has no administration in time range)  senna-docusate (Senokot-S) tablet 1 tablet (has  no administration in time range)     IMPRESSION / MDM / ASSESSMENT AND PLAN / ED COURSE  I reviewed the triage vital signs and the nursing notes.                              Differential diagnosis includes, but is not limited to, MS exacerbation, myalgias, lumbar radiculopathy  Patient's presentation is most consistent with acute presentation with potential threat to life or bodily function.  Patient's diagnosis is consistent with his exacerbation. Patient will be to the hospital service for neurology consultation. Patient is t is understanding and agreeable to the plan of care at this time.   FINAL CLINICAL IMPRESSION(S) / ED DIAGNOSES   Final diagnoses:  Multiple sclerosis exacerbation ([place])     Rx / DC Orders   ED Discharge Orders     None        Note:  This document was prepared using  Dragon voice recognition software and may include unintentional dictation errors.    Melvenia Needles, PA-C 07/29/22 1739    Lavonia Drafts, MD 07/29/22 [phone]

## 2022-07-29 NOTE — Progress Notes (Signed)
Pt refuses MRI stating that "I get extremely claustrophobic and I have to be knocked out. And it won't make a difference anyway. I'm not changing my meds while I'm in this hospital or before I see my neurologist." MRI aware. Plan of care ongoing.

## 2022-07-29 NOTE — Hospital Course (Addendum)
Lisa Williams is a 62 year old female with history of multiple sclerosis, hypertension, neurogenic bladder, hidradenitis, who presents emergency department for chief concerns of headache and leg weakness.  Vitals in the ED showed temperature of 98.8, respiration rate of 18, heart rate of 80, blood pressure 182/94, SpO2 of 98% on room air.  Serum sodium 140, potassium 3.2, chloride 111, bicarb 22, BUN of 20, serum creatinine of 2.48, nonfasting glucose 93, EGFR greater than 60, WBC 7.6, hemoglobin 13, platelets of 242.  ED treatment: Solu-Medrol 1000 mg IV one-time dose.

## 2022-07-29 NOTE — Assessment & Plan Note (Signed)
Resumed home siponimod 2 mg daily

## 2022-07-29 NOTE — Assessment & Plan Note (Signed)
Home gabapentin 100 mg p.o. twice daily resumed

## 2022-07-29 NOTE — Assessment & Plan Note (Addendum)
Patient takes telmisartan 40 mg daily, home equivalent of irbesartan 150 mg daily resumed Hydralazine 10 mg p.o. every 8 hours as needed for SBP greater than 175, 4 days ordered

## 2022-07-29 NOTE — H&P (Addendum)
History and Physical   Lisa Williams [id number] DOB: Jun 02, 1960 DOA: 07/29/2022  PCP: Kirk Ruths, MD  Patient coming from: Home via EMS  I have personally briefly reviewed patient's old medical records in [place].  Chief Concern: Headache and leg weakness  HPI: [name] is a 62 year old female with history of multiple sclerosis, hypertension, neurogenic bladder, hidradenitis, who presents emergency department for chief concerns of headache and leg weakness.  Vitals in the ED showed temperature of 98.8, respiration rate of 18, heart rate of 80, blood pressure 182/94, SpO2 of 98% on room air.  Serum sodium 140, potassium 3.2, chloride 111, bicarb 22, BUN of 20, serum creatinine of 2.48, nonfasting glucose 93, EGFR greater than 60, WBC 7.6, hemoglobin 13, platelets of 242.  ED treatment: Solu-Medrol 1000 mg IV one-time dose. ------------------------- At bedside, she is able to tell me her name, age, current location.   She reprots she developed a headache and leg weakness. She states the headache is the normal ones she occasionally gets and she took aspirin and tylenol at home which helped her. She reprot sthe headache has improved significantly already.   She reports the weakness started at 2 p and quickly after, she was not able to walk. She walks with brace on both sides.   Social history: She lives at home with her mom and sister. She denies current tobacco use, quitting in 2018. She denies etoh and recreational drug use.   ROS: Constitutional: no weight change, no fever ENT/Mouth: no sore throat, no rhinorrhea Eyes: no eye pain, no vision changes Cardiovascular: no chest pain, no dyspnea,  no edema, no palpitations Respiratory: no cough, no sputum, no wheezing Gastrointestinal: no nausea, no vomiting, no diarrhea, no constipation Genitourinary: no urinary incontinence, no dysuria, no hematuria Musculoskeletal: no arthralgias, + lower  extremities myalgias Skin: no skin lesions, no pruritus, Neuro: + weakness, no loss of consciousness, no syncope Psych: no anxiety, no depression, no decrease appetite Heme/Lymph: no bruising, no bleeding  ED Course: Discussed with emergency medicine provider, patient requiring hospitalization for chief concerns of MS flare.  Assessment/Plan  Principal Problem:   Multiple sclerosis exacerbation (HCC) Active Problems:   Hypercholesteremia   Hereditary and idiopathic neuropathy   Adiposity   Hypercholesterolemia without hypertriglyceridemia   Multiple sclerosis (HCC)   Chronic left shoulder pain   Essential hypertension   Spasticity   Neurogenic bladder   Weakness   Hypokalemia   Weakness of both lower extremities   Assessment and Plan:  * Multiple sclerosis exacerbation (HCC) Status post Solu-Medrol 1000 mg IV one-time dose per EDP Ordered Solu-Medrol 1000 mg IV starting at 07/30/2022, 2 doses ordered  Weakness of both lower extremities New and sudden onset Workup in progress, MRI of the brain without contrast has been ordered  Hypokalemia Check serum magnesium level Replace with potassium chloride 30 mEq p.o. one-time dose on admission Repeat BMP in the a.m.  Spasticity Resumed home baclofen 20 mg p.o. 3 times daily, gabapentin 100 mg p.o. twice daily, home tizanidine dosing  Essential hypertension Patient takes telmisartan 40 mg daily, home equivalent of irbesartan 150 mg daily resumed Hydralazine 10 mg p.o. every 8 hours as needed for SBP greater than 175, 4 days ordered  Multiple sclerosis (HCC) Resumed home siponimod 2 mg daily  Hereditary and idiopathic neuropathy Home gabapentin 100 mg p.o. twice daily resumed  Chart reviewed.   DVT prophylaxis: Enoxaparin Code Status: full code Diet: Heart healthy Family Communication: A phone call  was offered, patient declined. Patient states she has already spoken with her family Disposition Plan: Pending clinical  course Consults called: None at this time Admission status: telemetry medical, inpatient  Past Medical History:  Diagnosis Date   Abdominal pain, right upper quadrant    Back pain    Calculus of kidney 12/09/2013   Chronic back pain    unspecified   Chronic left shoulder pain [id number]   Complication of anesthesia    Functional disorder of bladder    other   Galactorrhea 11/26/2014   Chronic    Hereditary and idiopathic neuropathy 08/19/2013   History of kidney stones    HPV test positive    Hypercholesteremia 08/19/2013   Hypertension    Incomplete bladder emptying    Microscopic hematuria    MS (multiple sclerosis) (HCC)    Muscle spasticity 05/21/2014   Nonspecific findings on examination of urine    other   Osteopenia    PONV (postoperative nausea and vomiting)    Status post laparoscopic supracervical hysterectomy 11/26/2014   Tobacco user 11/26/2014   Wrist fracture    Past Surgical History:  Procedure Laterality Date   bilateral tubal ligation  1996   BREAST CYST EXCISION Left 2002   CYST EXCISION Left 05/10/2022   Procedure: CYST REMOVAL;  Surgeon: Herbert Pun, MD;  Location: [facility];  Service: General;  Laterality: Left;   FRACTURE SURGERY     KNEE SURGERY     right   LAPAROSCOPIC SUPRACERVICAL HYSTERECTOMY  08/05/2013   ORIF WRIST FRACTURE Left 01/17/2017   Procedure: OPEN REDUCTION INTERNAL FIXATION (ORIF) WRIST FRACTURE;  Surgeon: Lovell Sheehan, MD;  Location: [facility];  Service: Orthopedics;  Laterality: Left;   RADIOLOGY WITH ANESTHESIA N/A 03/18/2020   Procedure: MRI WITH ANESTHESIA CERVICAL SPINE AND BRAIN  WITH AND WITHOUT CONTRAST;  Surgeon: Radiologist, Medication, MD;  Location: [place];  Service: Radiology;  Laterality: N/A;   TUBAL LIGATION Bilateral    VAGINAL HYSTERECTOMY  03/2006   Social History:  reports that she has quit smoking. Her smoking use included cigarettes. She smoked an average of .5 packs per day. She has never used  smokeless tobacco. She reports that she does not drink alcohol and does not use drugs.  Allergies  Allergen Reactions   Amlodipine Swelling   Betadine [Povidone-Iodine]    Povidone Iodine Rash   Family History  Problem Relation Age of Onset   Sickle cell trait Sister    Hypertension Sister    Supraventricular tachycardia Sister    Hypertension Sister    Diabetes Maternal Grandmother    Liver cancer Maternal Grandmother    Diabetes Maternal Aunt    Breast cancer Maternal Aunt        great MAT   Ovarian cancer Paternal Grandmother    Nephrolithiasis Father    Hypertension Father    Prostate cancer Father    Kidney disease Father    Heart failure Father    Hypertension Sister    Osteoarthritis Mother    Hypertension Mother    Hyperlipidemia Mother    GU problems Neg Hx    Urolithiasis Neg Hx    Family history: Family history reviewed and not pertinent  Prior to Admission medications   Medication Sig Start Date End Date Taking? Authorizing Provider  acetaminophen (TYLENOL) 500 MG tablet Take 500-1,000 mg by mouth every 6 (six) hours as needed for moderate pain or headache.    [provider]  baclofen (LIORESAL) 20 MG  tablet Take 1 tablet (20 mg total) by mouth 3 (three) times daily. 08/01/21   Sater, Nanine Means, MD  Cholecalciferol 25 MCG (1000 UT) tablet Take 5,000 Units by mouth daily.    [provider]  gabapentin (NEURONTIN) 100 MG capsule Take 1 capsule (100 mg total) by mouth 2 (two) times daily. 08/01/21   Sater, Nanine Means, MD  oxybutynin (DITROPAN-XL) 10 MG 24 hr tablet Take 1 tablet (10 mg total) by mouth daily. 04/12/22   Hollice Espy, MD  Siponimod Fumarate (MAYZENT) 2 MG TABS Take 2 mg by mouth daily. 03/22/22   Sater, Nanine Means, MD  telmisartan (MICARDIS) 40 MG tablet Take 40 mg by mouth daily.    [provider]  tiZANidine (ZANAFLEX) 4 MG tablet One po qAM, one po qPM and 2 po qHS 07/24/22   Sater, Nanine Means, MD   Physical  Exam: Vitals:   07/29/22 1510 07/29/22 1512 07/29/22 1700  BP: (!) 182/94  (!) 178/88  Pulse: 80  77  Resp: 18  18  Temp: 98.8 F (37.1 C)  98.7 F (37.1 C)  TempSrc: Oral  Oral  SpO2: 98%  100%  Weight:  119.3 kg   Height:  5\' 11"  (1.803 m)    Constitutional: appears age appropriate, NAD, calm, comfortable Eyes: PERRL, lids and conjunctivae normal ENMT: Mucous membranes are moist. Posterior pharynx clear of any exudate or lesions. Age-appropriate dentition. Hearing appropriate Neck: normal, supple, no masses, no thyromegaly Respiratory: clear to auscultation bilaterally, no wheezing, no crackles. Normal respiratory effort. No accessory muscle use.  Cardiovascular: Regular rate and rhythm, no murmurs / rubs / gallops. No extremity edema. 2+ pedal pulses. No carotid bruits.  Abdomen: obese abdomen, no tenderness, no masses palpated, no hepatosplenomegaly. Bowel sounds positive.  Musculoskeletal: no clubbing / cyanosis. No joint deformity upper and lower extremities.  Patient had brace in bilateral lower extremities.  No lower extremity movements per patient due to her weakness Skin: no rashes, lesions, ulcers. No induration Neurologic: Sensation intact. Strength 5/5 in bilateral upper extremities. Psychiatric: Normal judgment and insight. Alert and oriented x 3. Normal mood.   EKG: Not indicated at this time  Chest x-ray on Admission: not indicated at this time  Labs on Admission: I have personally reviewed following labs  CBC: Recent Labs  Lab 07/29/22 1609  WBC 7.6  NEUTROABS 6.3  HGB 13.0  HCT 37.3  MCV 76.7*  PLT [id number]   Basic Metabolic Panel: Recent Labs  Lab 07/29/22 1609  NA 140  K 3.2*  CL 111  CO2 22  GLUCOSE 93  BUN 20  CREATININE 0.48  CALCIUM 8.5*   GFR: Estimated Creatinine Clearance: 106.5 mL/min (by C-G formula based on SCr of 0.48 mg/dL). Liver Function Tests: Recent Labs  Lab 07/29/22 1609  AST 42*  ALT 70*  ALKPHOS 124  BILITOT 0.9   PROT 6.7  ALBUMIN 3.8   Urine analysis:    Component Value Date/Time   COLORURINE AMBER (A) 11/15/2021 0130   APPEARANCEUR HAZY (A) 11/15/2021 0130   APPEARANCEUR Clear 03/03/2021 1128   LABSPEC 1.029 11/15/2021 0130   LABSPEC 1.030 07/31/2011 1838   PHURINE 5.0 11/15/2021 0130   GLUCOSEU NEGATIVE 11/15/2021 0130   GLUCOSEU Negative 07/31/2011 1838   HGBUR NEGATIVE 11/15/2021 0130   BILIRUBINUR NEGATIVE 11/15/2021 0130   BILIRUBINUR Negative 03/03/2021 1128   BILIRUBINUR Negative 07/31/2011 1838   KETONESUR 20 (A) 11/15/2021 0130   PROTEINUR NEGATIVE 11/15/2021 0130   NITRITE NEGATIVE 11/15/2021  [place] 11/15/2021 0130   LEUKOCYTESUR Negative 07/31/2011 1838   This document was prepared using Dragon Voice Recognition software and may include unintentional dictation errors.  Dr. Tobie Poet [facility]  If 7PM-7AM, please contact overnight-coverage provider If 7AM-7PM, please contact day coverage provider [email or web address]  07/29/2022, 6:56 PM

## 2022-07-30 DIAGNOSIS — I10 Essential (primary) hypertension: Secondary | ICD-10-CM | POA: Diagnosis not present

## 2022-07-30 DIAGNOSIS — R531 Weakness: Secondary | ICD-10-CM

## 2022-07-30 DIAGNOSIS — G35 Multiple sclerosis: Secondary | ICD-10-CM | POA: Diagnosis not present

## 2022-07-30 LAB — CBC
HCT: 38.3 % (ref 36.0–46.0)
Hemoglobin: 13.4 g/dL (ref 12.0–15.0)
MCH: 26.6 pg (ref 26.0–34.0)
MCHC: 35 g/dL (ref 30.0–36.0)
MCV: 76 fL — ABNORMAL LOW (ref 80.0–100.0)
Platelets: 250 10*3/uL (ref 150–400)
RBC: 5.04 MIL/uL (ref 3.87–5.11)
RDW: 14 % (ref 11.5–15.5)
WBC: 6.4 10*3/uL (ref 4.0–10.5)
nRBC: 0 % (ref 0.0–0.2)

## 2022-07-30 LAB — BASIC METABOLIC PANEL
Anion gap: 9 (ref 5–15)
BUN: 22 mg/dL — ABNORMAL HIGH (ref 6–20)
CO2: 21 mmol/L — ABNORMAL LOW (ref 22–32)
Calcium: 9.2 mg/dL (ref 8.9–10.3)
Chloride: 109 mmol/L (ref 98–111)
Creatinine, Ser: 0.64 mg/dL (ref 0.44–1.00)
GFR, Estimated: >60 mL/min (ref >60)
Glucose, Bld: 156 mg/dL — ABNORMAL HIGH (ref 70–99)
Potassium: 4.2 mmol/L (ref 3.5–5.1)
Sodium: 139 mmol/L (ref 135–145)

## 2022-07-30 LAB — MAGNESIUM: Magnesium: 2.1 mg/dL (ref 1.7–2.4)

## 2022-07-30 NOTE — Progress Notes (Signed)
This RN asked patient for personal supply of Siponimod to be verified by pharmacy for administration. Patient refused stated last admission medication was often administered late, it has to be taken at a specific time. Notified pharmacist Laurance Flatten and provider Jimmye Norman as well as Naoma Diener.

## 2022-07-30 NOTE — Progress Notes (Signed)
PROGRESS NOTE    Lisa Williams  [id number] DOB: 01/10/1961 DOA: 07/29/2022 PCP: Kirk Ruths, MD   Assessment & Plan:   Principal Problem:   Multiple sclerosis exacerbation [facility]) Active Problems:   Hypercholesteremia   Hereditary and idiopathic neuropathy   Adiposity   Hypercholesterolemia without hypertriglyceridemia   Multiple sclerosis (HCC)   Chronic left shoulder pain   Essential hypertension   Spasticity   Neurogenic bladder   Weakness   Hypokalemia   Weakness of both lower extremities  Assessment and Plan: Multiple sclerosis exacerbation: s/p IV solumederol 1000mg  x 1. Solumedrol 1000mg  x 2 doses more. Continue on home dose of siponimod. Pt is refusing MRI    Weakness of both lower extremities: likely secondary to MS exacerbation. Pt is refusing MRI    Hypokalemia: WNL today    Spasticity: continue on home dose of baclofen, gabapentin, tizanidine   HTN: continue on irbesartan. Hydralazine prn    Hereditary and idiopathic neuropathy: continue on home dose of gabapentin       DVT prophylaxis: lovenox Code Status: full  Family Communication:  Disposition Plan: depends on PT/OT recs   Level of care: Telemetry Medical  Status is: Inpatient Remains inpatient appropriate because: MS flare   Consultants:    Procedures:   Antimicrobials:   Subjective: Pt c/o weakness   Objective: Vitals:   07/29/22 1512 07/29/22 1700 07/29/22 2007 07/30/22 0423  BP:  (!) 178/88 (!) 183/96 (!) 110/53  Pulse:  77 80 60  Resp:  18 20 16   Temp:  98.7 F (37.1 C) 98.4 F (36.9 C) 98.1 F (36.7 C)  TempSrc:  Oral Oral Oral  SpO2:  100% 97% 96%  Weight: 119.3 kg     Height: 5\' 11"  (1.803 m)       Intake/Output Summary (Last 24 hours) at 07/30/2022 0731 Last data filed at 07/30/2022 0430 Gross per 24 hour  Intake --  Output 400 ml  Net -400 ml   Filed Weights   07/29/22 1512  Weight: 119.3 kg    Examination:  General exam: Appears  calm and comfortable  Respiratory system: Clear to auscultation. Respiratory effort normal. Cardiovascular system: S1 & S2+. No rubs, gallops or clicks. No pedal edema. Gastrointestinal system: Abdomen is nondistended, soft and nontender.  Normal bowel sounds heard. Central nervous system: Alert and oriented. Moves all extremities Psychiatry: Judgement and insight appear normal. Mood & affect appropriate.     Data Reviewed: I have personally reviewed following labs and imaging studies  CBC: Recent Labs  Lab 07/29/22 1609 07/30/22 0501  WBC 7.6 6.4  NEUTROABS 6.3  --   HGB 13.0 13.4  HCT 37.3 38.3  MCV 76.7* 76.0*  PLT 242 [id number]   Basic Metabolic Panel: Recent Labs  Lab 07/29/22 1609 07/29/22 1930 07/30/22 0501  NA 140  --  139  K 3.2*  --  4.2  CL 111  --  109  CO2 22  --  21*  GLUCOSE 93  --  156*  BUN 20  --  22*  CREATININE 0.48  --  0.64  CALCIUM 8.5*  --  9.2  MG  --  2.3 2.1  PHOS  --  3.1  --    GFR: Estimated Creatinine Clearance: 106.5 mL/min (by C-G formula based on SCr of 0.64 mg/dL). Liver Function Tests: Recent Labs  Lab 07/29/22 1609  AST 42*  ALT 70*  ALKPHOS 124  BILITOT 0.9  PROT 6.7  ALBUMIN 3.8  No results for input(s): "LIPASE", "AMYLASE" in the last 168 hours. No results for input(s): "AMMONIA" in the last 168 hours. Coagulation Profile: No results for input(s): "INR", "PROTIME" in the last 168 hours. Cardiac Enzymes: No results for input(s): "CKTOTAL", "CKMB", "CKMBINDEX", "TROPONINI" in the last 168 hours. BNP (last 3 results) No results for input(s): "PROBNP" in the last 8760 hours. HbA1C: No results for input(s): "HGBA1C" in the last 72 hours. CBG: No results for input(s): "GLUCAP" in the last 168 hours. Lipid Profile: No results for input(s): "CHOL", "HDL", "LDLCALC", "TRIG", "CHOLHDL", "LDLDIRECT" in the last 72 hours. Thyroid Function Tests: No results for input(s): "TSH", "T4TOTAL", "FREET4", "T3FREE", "THYROIDAB" in the  last 72 hours. Anemia Panel: No results for input(s): "VITAMINB12", "FOLATE", "FERRITIN", "TIBC", "IRON", "RETICCTPCT" in the last 72 hours. Sepsis Labs: No results for input(s): "PROCALCITON", "LATICACIDVEN" in the last 168 hours.  No results found for this or any previous visit (from the past 240 hour(s)).       Radiology Studies: No results found.      Scheduled Meds:  baclofen  20 mg Oral TID   cholecalciferol  5,000 Units Oral Daily   enoxaparin (LOVENOX) injection  0.5 mg/kg Subcutaneous QHS   gabapentin  100 mg Oral BID   irbesartan  150 mg Oral Daily   oxybutynin  10 mg Oral Daily   Siponimod Fumarate  2 mg Oral Daily   tiZANidine  4 mg Oral BID   tiZANidine  8 mg Oral QHS   Continuous Infusions:  methylPREDNISolone (SOLU-MEDROL) injection       LOS: 1 day    Time spent: 35 mins     Wyvonnia Dusky, MD [facility] Pager [phone]  If 7PM-7AM, please contact night-coverage [email or web address] 07/30/2022, 7:31 AM

## 2022-07-30 NOTE — Consult Note (Signed)
NEURO HOSPITALIST CONSULT NOTE   Requestig physician: Dr. Tobie Poet  Reason for Consult: Acute onset of BLE weakness in the setting of known multiple sclerosis  History obtained from:  Patient and Chart     HPI:                                                                                                                                          Lisa Williams is an 62 y.o. female with a PMHx of MS, chronic back pain, leg weakness with bilateral ankle-foot orthoses, neurogenic bladder, chronic galactorrhea, hereditary and idiopathic neuropathy (on Neurontin), muscle spasticity (on baclofen and tizanidine), HTN and hypercholesterolemia who presented to the ED yesterday afternoon with worsened BLE weakness and a new headache since the prior night, on a background of progressive BLE weakness over the past week with near-falls at home. She stated that she felt that she was having an MS exacerbation. Her last exacerbation was last year, requiring admission. She denied any infectious symptoms including no cough, SOB, fevers, chills or sweats. Labs in the ED showed hypokalemia, CBC showed no leukocytosis and eGFR > 60.  She was admitted to the Hospitalist service and started on Solumedrol 1000 mg IV. MRI brain with and without contrast was ordered. For her  She states that her headache is nothing unusual and that it has improved with the ASA and Tylenol that she took at home prior to arrival.   She follows up with a Neurologist in [place] (Dr. Felecia Shelling at [facility]). She last saw him on 08/01/21. The HPI from that visit has been reviewed: "She is a 62 year old woman who was diagnosed in 1995 after presenting with right leg weakness and foot drop.   One day, she lost all sensation in her legs.   She saw Dr. Carlis Abbott in [place] until his retirement.   She then saw Dr. Melrose Nakayama in [place] and she started to see Dr. Jannifer Franklin 2 years ago.     She was initially placed on Copaxone x 3 years and  then was off all DMTs for many years and then started Rituxan around 2018 to 2019.  At that time, associated with a UTI, she felt much more weak prompting her to go on a medication.   She started Mayzent since 2020.   She has tolerated it well."  Imaging review from Dr. Garth Bigness clinic note: MRI of the brain 03/18/2020 showed multiple T2/FLAIR hyperintense foci in the periventricular, juxtacortical and deep white matter.  There were no infratentorial lesions noted.  None of the foci enhanced.   Due to severe claustrophobia, the study was done with conscious sedation in the hospital.     MRI of the cervical spine 03/18/2020 showed T2 hyperintense foci at C1-C2, C2-C3, C4-C5, C5-C6 and T1.  There is a disc protrusion at C5-C6 to the left and a larger disc protrusion at C6-C7 to the right that distorts the thecal sac.  The adjacent spinal cord has normal signal.  There could be compression of the right C7 nerve root.    As disease-modifying therapy for her MS, she continues on siponimod (aka Mayzent), an orally administered selective sphingosine-1-phosphate receptor modulator. The mechanism of action of this medication is inhibition of lymphocyte migration from the periphery to the CNS with subsequent decrease in inflammation, in addition to a neuroprotective effect modulated by microglial cells.   Past Medical History:  Diagnosis Date   Abdominal pain, right upper quadrant    Back pain    Calculus of kidney 12/09/2013   Chronic back pain    unspecified   Chronic left shoulder pain [id number]   Complication of anesthesia    Functional disorder of bladder    other   Galactorrhea 11/26/2014   Chronic    Hereditary and idiopathic neuropathy 08/19/2013   History of kidney stones    HPV test positive    Hypercholesteremia 08/19/2013   Hypertension    Incomplete bladder emptying    Microscopic hematuria    MS (multiple sclerosis) (HCC)    Muscle spasticity 05/21/2014   Nonspecific findings on  examination of urine    other   Osteopenia    PONV (postoperative nausea and vomiting)    Status post laparoscopic supracervical hysterectomy 11/26/2014   Tobacco user 11/26/2014   Wrist fracture     Past Surgical History:  Procedure Laterality Date   bilateral tubal ligation  1996   BREAST CYST EXCISION Left 2002   CYST EXCISION Left 05/10/2022   Procedure: CYST REMOVAL;  Surgeon: Herbert Pun, MD;  Location: [facility] ORS;  Service: General;  Laterality: Left;   FRACTURE SURGERY     KNEE SURGERY     right   LAPAROSCOPIC SUPRACERVICAL HYSTERECTOMY  08/05/2013   ORIF WRIST FRACTURE Left 01/17/2017   Procedure: OPEN REDUCTION INTERNAL FIXATION (ORIF) WRIST FRACTURE;  Surgeon: Lovell Sheehan, MD;  Location: [facility] ORS;  Service: Orthopedics;  Laterality: Left;   RADIOLOGY WITH ANESTHESIA N/A 03/18/2020   Procedure: MRI WITH ANESTHESIA CERVICAL SPINE AND BRAIN  WITH AND WITHOUT CONTRAST;  Surgeon: Radiologist, Medication, MD;  Location: [place];  Service: Radiology;  Laterality: N/A;   TUBAL LIGATION Bilateral    VAGINAL HYSTERECTOMY  03/2006    Family History  Problem Relation Age of Onset   Sickle cell trait Sister    Hypertension Sister    Supraventricular tachycardia Sister    Hypertension Sister    Diabetes Maternal Grandmother    Liver cancer Maternal Grandmother    Diabetes Maternal Aunt    Breast cancer Maternal Aunt        great MAT   Ovarian cancer Paternal Grandmother    Nephrolithiasis Father    Hypertension Father    Prostate cancer Father    Kidney disease Father    Heart failure Father    Hypertension Sister    Osteoarthritis Mother    Hypertension Mother    Hyperlipidemia Mother    GU problems Neg Hx    Urolithiasis Neg Hx              Social History:  reports that she has quit smoking. Her smoking use included cigarettes. She smoked an average of .5 packs per day. She has never used smokeless tobacco. She reports that she does not drink alcohol and  does  not use drugs.  Allergies  Allergen Reactions   Amlodipine Swelling   Betadine [Povidone-Iodine]    Povidone Iodine Rash    MEDICATIONS:                                                                                                                     Prior to Admission:  Medications Prior to Admission  Medication Sig Dispense Refill Last Dose   baclofen (LIORESAL) 20 MG tablet Take 1 tablet (20 mg total) by mouth 3 (three) times daily. 270 each 3 07/29/2022   Cholecalciferol 25 MCG (1000 UT) tablet Take 5,000 Units by mouth daily.   07/29/2022   gabapentin (NEURONTIN) 100 MG capsule Take 1 capsule (100 mg total) by mouth 2 (two) times daily. 180 capsule 3 07/29/2022   oxybutynin (DITROPAN-XL) 10 MG 24 hr tablet Take 1 tablet (10 mg total) by mouth daily. 90 tablet 3 07/29/2022   Siponimod Fumarate (MAYZENT) 2 MG TABS Take 2 mg by mouth daily. 30 tablet 11 07/29/2022   telmisartan (MICARDIS) 40 MG tablet Take 40 mg by mouth daily.   07/29/2022   tiZANidine (ZANAFLEX) 4 MG tablet One po qAM, one po qPM and 2 po qHS 120 tablet 1 07/29/2022   acetaminophen (TYLENOL) 500 MG tablet Take 500-1,000 mg by mouth every 6 (six) hours as needed for moderate pain or headache.      Scheduled:  baclofen  20 mg Oral TID   cholecalciferol  5,000 Units Oral Daily   enoxaparin (LOVENOX) injection  0.5 mg/kg Subcutaneous QHS   gabapentin  100 mg Oral BID   irbesartan  150 mg Oral Daily   oxybutynin  10 mg Oral Daily   Siponimod Fumarate  2 mg Oral Daily   tiZANidine  4 mg Oral BID   tiZANidine  8 mg Oral QHS   Continuous:  methylPREDNISolone (SOLU-MEDROL) injection       ROS:                                                                                                                                       No sore throat. No CP or cough. No dysuria. Other ROS as per HPI.   Blood pressure 125/72, pulse 63, temperature 98.1 F (36.7 C), temperature source Oral, resp. rate 18, height 5\' 11"  (1.803 m),  weight 119.3 kg, SpO2 96 %.   General Examination:  Physical Exam HEENT-  [place]/AT  Lungs- Respirations unlabored Extremities- Warm and well-perfused.   Neurological Examination Mental Status: Awake and alert. Fully oriented. Thought content appropriate.  Speech fluent without evidence of aphasia.  Able to follow all commands without difficulty. Cranial Nerves: II: Temporal visual fields intact with no extinction to DSS. PERRL. No RAPD. III,IV, VI: No ptosis. EOMI. No nystagmus. V: Temp sensation equal bilaterally VII: Smile symmetric VIII: Hearing intact to voice IX,X: No hypophonia or hoarseness XI: Symmetric XII: Midline tongue extension Motor: RUE: 4+/5 proximally and distally LUE: 5/5 RLE: 2/5 HF, KF, KE, ADF and APF with decreased ROM at ankle LLE 2/5 HF and KF. 3/5 KE. 2/5 ADF and APF with decreased ROM at ankle.  Sensory: Temp and FT intact proximally and distally x 4. RUE with extinction to DSS. Deep Tendon Reflexes: 1+ left triceps, biceps and brachioradialis. 2+ right triceps, biceps and brachioradialis. Brisk low amplitude patellars. 0 bilateral achilles. Toes upgoing bilaterally.   Positive Hoffman's sign on the right, negative on the left.  Cerebellar: No ataxia with FNF bilaterally. Unable to assess BLE due to weakness.  Gait: Unable to assess    Lab Results: Basic Metabolic Panel: Recent Labs  Lab 07/29/22 1609 07/29/22 1930 07/30/22 0501  NA 140  --  139  K 3.2*  --  4.2  CL 111  --  109  CO2 22  --  21*  GLUCOSE 93  --  156*  BUN 20  --  22*  CREATININE 0.48  --  0.64  CALCIUM 8.5*  --  9.2  MG  --  2.3 2.1  PHOS  --  3.1  --     CBC: Recent Labs  Lab 07/29/22 1609 07/30/22 0501  WBC 7.6 6.4  NEUTROABS 6.3  --   HGB 13.0 13.4  HCT 37.3 38.3  MCV 76.7* 76.0*  PLT 242 250    Cardiac Enzymes: No results for input(s): "CKTOTAL", "CKMB",  "CKMBINDEX", "TROPONINI" in the last 168 hours.  Lipid Panel: No results for input(s): "CHOL", "TRIG", "HDL", "CHOLHDL", "VLDL", "LDLCALC" in the last 168 hours.  Imaging: No results found.  Assessment: 62 year old female with multiple sclerosis diagnosed approximately 25 years ago who presents with acute onset of BLE weakness.  - Exam reveals findings referable to the spinal cord (as expected given spinal cord lesions seen previously on MRI) with little to no evidence for clinically disabling brain demyelination, although previous MRI brain as documented in HPI did show old demyelinating lesions.  - Overall presentation is most consistent with an acute MS exacerbation - On her second dose of IV Solumedrol at time of today's examination. States that she feels her legs are slightly improved in strength since yesterday  Recommendations: - Continue IV Solumedrol at 1000 mg qd for a total of 5 days. Last dose on Wednesday.  - The patient has expressed reluctance to undergo MRI due to claustrophobia. MRI brain has been ordered but based on her remarks to nursing, it is unlikely that it will be able to be completed during this admission. Scanning is unlikely to change management.  - Continue current oral disease-modifying treatment with daily siponimod.  - Continue her medications for spasticity and neuropathy - PT and OT consults - Will need outpatient follow up scheduled with Dr. Felecia Shelling [facility], [facility] [place]) at the time of discharge.  - Monitor CBG, WBC and chem7 during steroid treatment   Electronically signed: Dr. Kerney Elbe 07/30/2022, 8:25 AM

## 2022-07-30 NOTE — Evaluation (Signed)
Physical Therapy Evaluation Patient Details Name: Lisa Williams MRN: [id number] DOB: 29-May-1960 Today's Date: 07/30/2022  History of Present Illness  Lisa Williams is an 62 y.o. female with a PMHx of MS, chronic back pain, leg weakness with bilateral ankle-foot orthoses, neurogenic bladder, chronic galactorrhea, hereditary and idiopathic neuropathy (on Neurontin), muscle spasticity (on baclofen and tizanidine), HTN and hypercholesterolemia who presented to the ED yesterday afternoon with worsened BLE weakness and a new headache since the prior night, on a background of progressive BLE weakness over the past week with near-falls at home. She stated that she felt that she was having an MS exacerbation.    Clinical Impression  Pt well known to hospital and rehab team from prior admissions and outpatient therapy.  Pt received in Semi-Fowler's position and agreeable to therapy.  Pt required assistance in order to don AFO's at EOB.  Pt also requires assistance for LE navigation in order to come seated EOB.  Pt requires maxA from therapist to come upright, attempted from the R side on initially attempts.  Pt able to perform, however performed better with therapist assisting from the L side.  Pt attempted weight shifts in order to advance LE's, and was able to complete with the R LE advancing, but not the L LE advancing.  Pt then transferred back to the bed with supervision initially and only needing assistance moving the LE's to be more centered in the bed.  All needs met and call bell within reach.  Pt's family members in the room upon leaving the room.     Recommendations for follow up therapy are one component of a multi-disciplinary discharge planning process, led by the attending physician.  Recommendations may be updated based on patient status, additional functional criteria and insurance authorization.  Follow Up Recommendations Skilled nursing-short term rehab (<3  hours/day) Can patient physically be transported by private vehicle: No    Assistance Recommended at Discharge Frequent or constant Supervision/Assistance  Patient can return home with the following  A lot of help with walking and/or transfers;A lot of help with bathing/dressing/bathroom;Help with stairs or ramp for entrance    Equipment Recommendations None recommended by PT  Recommendations for Other Services       Functional Status Assessment Patient has had a recent decline in their functional status and demonstrates the ability to make significant improvements in function in a reasonable and predictable amount of time.     Precautions / Restrictions Precautions Precautions: Fall      Mobility  Bed Mobility Overal bed mobility: Needs Assistance Bed Mobility: Supine to Sit     Supine to sit: Mod assist     General bed mobility comments: LE navigation.    Transfers Overall transfer level: Needs assistance Equipment used: Rolling walker (2 wheels) Transfers: Sit to/from Stand Sit to Stand: Max assist           General transfer comment: Pt required assistance from the L side in order to come upright.    Ambulation/Gait               General Gait Details: attempted to weight shift to each side but unable to shift weight to the R side to allow for L foot clearance.  deferred ambulation ultimately.  Stairs            Wheelchair Mobility    Modified Rankin (Stroke Patients Only)       Balance Overall balance assessment: Needs assistance Sitting-balance support: Bilateral upper extremity supported,  Feet supported Sitting balance-Leahy Scale: Good     Standing balance support: Bilateral upper extremity supported, During functional activity, Reliant on assistive device for balance Standing balance-Leahy Scale: Poor                               Pertinent Vitals/Pain Pain Assessment Pain Assessment: No/denies pain    Home  Living Family/patient expects to be discharged to:: Private residence Living Arrangements: Parent;Other relatives Available Help at Discharge: Family;Available 24 hours/day Type of Home: House Home Access: Ramped entrance     Alternate Level Stairs-Number of Steps: Pt does not go upstairs; unable to complete stairs at baseline. Home Layout: Two level;Able to live on main level with bedroom/bathroom Home Equipment: Rolling Walker (2 wheels);Hospital bed;Wheelchair - manual;Tub bench Additional Comments: Pt has ramped entrance into home in garage. Pt utilizes RW for car transfers, but wheelchair to get in/out of home. Pt ambulatory with RW at baseline. Reported handicap accessible bathroom set up, and pt lives on 1st floor unable to ascend stairs at baseline.    Prior Function Prior Level of Function : Driving;Independent/Modified Independent             Mobility Comments: ramped enterance into home.Mwc in and out of home. RW for car transfers, amb with RW at baseline. ADLs Comments: MOD I with bathing, dressing, cleaning, cooking     Hand Dominance   Dominant Hand: Right    Extremity/Trunk Assessment   Upper Extremity Assessment Upper Extremity Assessment: Overall WFL for tasks assessed    Lower Extremity Assessment Lower Extremity Assessment: Generalized weakness (Unable to weight shift to the R to allow for L LE to advance.)       Communication   Communication: No difficulties  Cognition Arousal/Alertness: Awake/alert Behavior During Therapy: WFL for tasks assessed/performed Overall Cognitive Status: Within Functional Limits for tasks assessed                                          General Comments      Exercises     Assessment/Plan    PT Assessment Patient needs continued PT services  PT Problem List Decreased strength;Decreased activity tolerance;Decreased balance;Decreased mobility;Decreased safety awareness       PT Treatment  Interventions DME instruction;Gait training;Functional mobility training;Therapeutic activities;Therapeutic exercise;Balance training;Neuromuscular re-education    PT Goals (Current goals can be found in the Care Plan section)  Acute Rehab PT Goals Patient Stated Goal: go to rehab, get stronger, and go back home. PT Goal Formulation: With patient Time For Goal Achievement: 08/13/22 Potential to Achieve Goals: Good    Frequency Min 2X/week     Co-evaluation               AM-PAC PT "6 Clicks" Mobility  Outcome Measure Help needed turning from your back to your side while in a flat bed without using bedrails?: A Lot Help needed moving from lying on your back to sitting on the side of a flat bed without using bedrails?: A Lot Help needed moving to and from a bed to a chair (including a wheelchair)?: A Lot Help needed standing up from a chair using your arms (e.g., wheelchair or bedside chair)?: A Little Help needed to walk in hospital room?: A Lot Help needed climbing 3-5 steps with a railing? : Total 6 Click Score: 12  End of Session Equipment Utilized During Treatment: Gait belt Activity Tolerance: Patient tolerated treatment well;Patient limited by fatigue Patient left: in bed Nurse Communication: Mobility status PT Visit Diagnosis: Unsteadiness on feet (R26.81);Other abnormalities of gait and mobility (R26.89);Muscle weakness (generalized) (M62.81);History of falling (Z91.81);Difficulty in walking, not elsewhere classified (R26.2)    Time: 1206-1258 PT Time Calculation (min) (ACUTE ONLY): 52 min   Charges:   PT Evaluation $PT Eval Low Complexity: 1 Low PT Treatments $Therapeutic Activity: 23-37 mins        Gwenlyn Saran, PT, DPT Physical Therapist - [facility]  07/30/22, 5:09 PM

## 2022-07-31 DIAGNOSIS — R531 Weakness: Secondary | ICD-10-CM | POA: Diagnosis not present

## 2022-07-31 DIAGNOSIS — G35 Multiple sclerosis: Secondary | ICD-10-CM | POA: Diagnosis not present

## 2022-07-31 DIAGNOSIS — I10 Essential (primary) hypertension: Secondary | ICD-10-CM | POA: Diagnosis not present

## 2022-07-31 LAB — URINALYSIS, ROUTINE W REFLEX MICROSCOPIC
Bilirubin Urine: NEGATIVE
Glucose, UA: NEGATIVE mg/dL
Hgb urine dipstick: NEGATIVE
Ketones, ur: NEGATIVE mg/dL
Leukocytes,Ua: NEGATIVE
Nitrite: NEGATIVE
Protein, ur: NEGATIVE mg/dL
Specific Gravity, Urine: 1.032 — ABNORMAL HIGH (ref 1.005–1.030)
pH: 5 (ref 5.0–8.0)

## 2022-07-31 LAB — BASIC METABOLIC PANEL
Anion gap: 10 (ref 5–15)
BUN: 29 mg/dL — ABNORMAL HIGH (ref 6–20)
CO2: 22 mmol/L (ref 22–32)
Calcium: 9.4 mg/dL (ref 8.9–10.3)
Chloride: 107 mmol/L (ref 98–111)
Creatinine, Ser: 0.63 mg/dL (ref 0.44–1.00)
GFR, Estimated: >60 mL/min (ref >60)
Glucose, Bld: 150 mg/dL — ABNORMAL HIGH (ref 70–99)
Potassium: 4.3 mmol/L (ref 3.5–5.1)
Sodium: 139 mmol/L (ref 135–145)

## 2022-07-31 MED ORDER — TIZANIDINE HCL 4 MG PO TABS
4.0000 mg | ORAL_TABLET | Freq: Every day | ORAL | Status: DC
  Administered 2022-08-01 – 2022-08-02 (×2): 4 mg via ORAL
  Filled 2022-07-31 (×2): qty 1

## 2022-07-31 MED ORDER — SIPONIMOD FUMARATE 2 MG PO TABS
2.0000 mg | ORAL_TABLET | Freq: Every day | ORAL | Status: DC
  Administered 2022-07-31 – 2022-08-02 (×3): 2 mg via ORAL
  Filled 2022-07-31: qty 1

## 2022-07-31 MED ORDER — ADULT MULTIVITAMIN W/MINERALS CH
1.0000 | ORAL_TABLET | Freq: Every day | ORAL | Status: DC
  Administered 2022-08-02: 1 via ORAL
  Filled 2022-07-31 (×2): qty 1

## 2022-07-31 NOTE — Progress Notes (Signed)
Neurology progress note  S: Patient still feels significant BLE weakness, mildly improved since starting steroids. Strength exam is objectively unchanged. Today is day 3 of 5 of steroids. No new complaints. Not able to undergo MRI 2/2 severe claustrophobia.   O:  Vitals:   07/31/22 0753 07/31/22 1523  BP: (!) 135/57 134/64  Pulse: (!) 49 (!) 55  Resp: 18 18  Temp: 97.9 F (36.6 C) 98.3 F (36.8 C)  SpO2: 96% 99%   Physical Exam HEENT-  [place]/AT  Lungs- Respirations unlabored Extremities- Warm and well-perfused.    Neurological Examination Mental Status: Awake and alert. Fully oriented. Thought content appropriate.  Speech fluent without evidence of aphasia.  Able to follow all commands without difficulty. Cranial Nerves: II: Temporal visual fields intact with no extinction to DSS. PERRL. No RAPD. III,IV, VI: No ptosis. EOMI. No nystagmus. V: Temp sensation equal bilaterally VII: Smile symmetric VIII: Hearing intact to voice IX,X: No hypophonia or hoarseness XI: Symmetric XII: Midline tongue extension Motor: RUE: 4+/5 proximally and distally LUE: 5/5 RLE: 2/5 HF, KF, KE, ADF and APF with decreased ROM at ankle LLE 2/5 HF and KF. 3/5 KE. 2/5 ADF and APF with decreased ROM at ankle.  Sensory: Temp and FT intact proximally and distally x 4. RUE with extinction to DSS. Deep Tendon Reflexes: 1+ left triceps, biceps and brachioradialis. 2+ right triceps, biceps and brachioradialis. Brisk low amplitude patellars. 0 bilateral achilles. Toes upgoing bilaterally.   Positive Hoffman's sign on the right, negative on the left.  Cerebellar: No ataxia with FNF bilaterally. Unable to assess BLE due to weakness.  Gait: Unable to assess  Assessment: 62 year old female with multiple sclerosis diagnosed approximately 25 years ago who presents with acute onset of BLE weakness.  - Exam reveals findings referable to the spinal cord (as expected given spinal cord lesions seen previously on MRI) with  little to no evidence for clinically disabling brain demyelination, although previous MRI brain as documented in HPI did show old demyelinating lesions.  - Overall presentation is most consistent with an acute MS exacerbation - On her third dose of IV Solumedrol at time of today's examination. States that she feels her legs are slightly improved in strength since yesterday   Recommendations: - Continue IV Solumedrol at 1000 mg qd for a total of 5 days. Last dose on Wednesday.  - The patient is unable to tolerate MRI brain 2/2 severe claustrophobia. Scanning is unlikely to change management.  - Continue current oral disease-modifying treatment with daily siponimod.  - Continue her medications for spasticity and neuropathy - PT and OT consults - Will need outpatient follow up scheduled with Dr. Felecia Shelling [facility], [facility] [place]) at the time of discharge.  - Monitor CBG, WBC and chem7 during steroid treatment - Patient requests steroid taper after solumedrol regimen is completed, we will give her taper at discharge - Will continue to follow  Su Monks, MD [facility] [phone]  If [place], please page neurology on call as listed in [place].

## 2022-07-31 NOTE — Progress Notes (Signed)
Physical Therapy Treatment Patient Details Name: Lisa Williams MRN: [id number] DOB: 05/04/1961 Today's Date: 07/31/2022   History of Present Illness Lisa Williams is an 62 y.o. female with a PMHx of MS, chronic back pain, leg weakness with bilateral ankle-foot orthoses, neurogenic bladder, chronic galactorrhea, hereditary and idiopathic neuropathy (on Neurontin), muscle spasticity (on baclofen and tizanidine), HTN and hypercholesterolemia who presented to the ED yesterday afternoon with worsened BLE weakness and a new headache since the prior night, on a background of progressive BLE weakness over the past week with near-falls at home. She stated that she felt that she was having an MS exacerbation.    PT Comments    Pt received upright in bed. Agreeable and motivated to participate with PT. She relies on modA for LE's but able to mobilize her torso and Ue's to EOB. She relies dependently currently for donning socks, shoes and B AFO's. With bed features she is able to stand maxA but bed heabily elevated and heavy BUE support on RW and bouts of momentum to come to standing. Frequent LE buckling and unsteadiness noted in LE's due to weakness needing seated rest but is able to perform an additional STS effort. Pt unable to take steps to safely SPT transfer but OT able to assist and perform her eval. modA+2 with bed elevated to stand with OT but still unable to SPT with additional support. However pt is able to safely scoot transfer to the R into recliner with excellent BUE support strength to clear hips with multiple bouts needing maxA to mobilize and sequence her LE's. PT clearly fatigued post mobility with all needs in reach. NT educated on hoyer transfer back to bed. All needs in reach in care of NT. Pt well below baseline with PT continuing to recommend additional PT services prior to transition home.    Recommendations for follow up therapy are one component of a  multi-disciplinary discharge planning process, led by the attending physician.  Recommendations may be updated based on patient status, additional functional criteria and insurance authorization.  Follow Up Recommendations  Can patient physically be transported by private vehicle: No    Assistance Recommended at Discharge Frequent or constant Supervision/Assistance  Patient can return home with the following A lot of help with walking and/or transfers;A lot of help with bathing/dressing/bathroom;Help with stairs or ramp for entrance   Equipment Recommendations  None recommended by PT    Recommendations for Other Services       Precautions / Restrictions Precautions Precautions: Fall Restrictions Weight Bearing Restrictions: No     Mobility  Bed Mobility Overal bed mobility: Needs Assistance Bed Mobility: Supine to Sit     Supine to sit: Min assist, HOB elevated     General bed mobility comments: LE navigation. Patient Response: Cooperative  Transfers Overall transfer level: Needs assistance Equipment used: Rolling walker (2 wheels) Transfers: Sit to/from Stand, Bed to chair/wheelchair/BSC Sit to Stand: Max assist, Mod assist, +2 physical assistance, From elevated surface          Lateral/Scoot Transfers: Mod assist General transfer comment: Able to stand maxA and bed features with just PT. ModA+2 with OT present. modA on LE's for scoot rtansfer to recliner. OT CGA on torso throughout.    Ambulation/Gait               General Gait Details: unable at this time. LE's too weak to take steps   Stairs  Wheelchair Mobility    Modified Rankin (Stroke Patients Only)       Balance Overall balance assessment: Needs assistance Sitting-balance support: Bilateral upper extremity supported, Feet supported Sitting balance-Leahy Scale: Good     Standing balance support: Bilateral upper extremity supported, During functional activity, Reliant on  assistive device for balance Standing balance-Leahy Scale: Poor Standing balance comment: Frequent LE's buckling relying on RW for support.                            Cognition Arousal/Alertness: Awake/alert Behavior During Therapy: WFL for tasks assessed/performed Overall Cognitive Status: Within Functional Limits for tasks assessed                                          Exercises      General Comments        Pertinent Vitals/Pain Pain Assessment Pain Assessment: No/denies pain    Home Living                          Prior Function            PT Goals (current goals can now be found in the care plan section) Acute Rehab PT Goals Patient Stated Goal: go to rehab, get stronger, and go back home. PT Goal Formulation: With patient Time For Goal Achievement: 08/13/22 Potential to Achieve Goals: Good Progress towards PT goals: Progressing toward goals    Frequency    Min 2X/week      PT Plan Current plan remains appropriate    Co-evaluation PT/OT/SLP Co-Evaluation/Treatment: Yes Reason for Co-Treatment: Complexity of the patient's impairments (multi-system involvement);For patient/therapist safety;To address functional/ADL transfers PT goals addressed during session: Mobility/safety with mobility;Strengthening/ROM OT goals addressed during session: ADL's and self-care;Strengthening/ROM      AM-PAC PT "6 Clicks" Mobility   Outcome Measure  Help needed turning from your back to your side while in a flat bed without using bedrails?: A Lot Help needed moving from lying on your back to sitting on the side of a flat bed without using bedrails?: A Lot Help needed moving to and from a bed to a chair (including a wheelchair)?: A Lot Help needed standing up from a chair using your arms (e.g., wheelchair or bedside chair)?: A Lot Help needed to walk in hospital room?: Total Help needed climbing 3-5 steps with a railing? :  Total 6 Click Score: 10    End of Session Equipment Utilized During Treatment: Gait belt Activity Tolerance: Patient tolerated treatment well;Patient limited by fatigue Patient left: in chair;with call bell/phone within reach;with chair alarm set;with nursing/sitter in room;Other (comment) (hoyer lift sling in recliner for nursing) Nurse Communication: Mobility status PT Visit Diagnosis: Unsteadiness on feet (R26.81);Other abnormalities of gait and mobility (R26.89);Muscle weakness (generalized) (M62.81);History of falling (Z91.81);Difficulty in walking, not elsewhere classified (R26.2)     Time: 0921-1000 PT Time Calculation (min) (ACUTE ONLY): 39 min  Charges:  $Therapeutic Exercise: 8-22 mins $Therapeutic Activity: 8-22 mins                     Davidson Palmieri M. Fairly IV, PT, DPT Physical Therapist- [facility]  07/31/2022, 11:05 AM

## 2022-07-31 NOTE — Progress Notes (Signed)
Nutrition Brief Note  Patient identified on the Malnutrition Screening Tool (MST) Report  62 y/o female with h/o Multiple Sclerosis, HTN and HLD who is admitted with MS exacerbation.   Met with pt in room today. Pt reports good appetite and oral intake pta and in hospital. Pt reports eating 100% of meals. Pt reports some intentional weight loss pta as she reports that she has been dieting with a friend. Pt has been unable to order some of the foods she likes r/t her diet restrictions. RD will liberalize pt's diet and add MVI. Pt reports constipation that has resolved with dietary changes.  Wt Readings from Last 15 Encounters:  07/29/22 119.3 kg  05/10/22 121.6 kg  05/02/22 121.6 kg  02/01/22 118.4 kg  11/15/21 119 kg  08/01/21 117.9 kg  04/06/21 123.8 kg  11/21/20 121.7 kg  10/05/20 113.4 kg  08/31/20 118.8 kg  03/18/20 122 kg  08/18/19 122.4 kg  08/07/19 122.3 kg  04/21/19 120.2 kg  03/12/19 124.3 kg    Body mass index is 36.68 kg/m. Patient meets criteria for obesity based on current BMI.   Current diet order is HH, patient is consuming approximately 100% of meals at this time. Labs and medications reviewed.   No nutrition interventions warranted at this time. If nutrition issues arise, please consult RD.   Koleen Distance MS, RD, LDN Please refer to [facility] for RD and/or RD on-call/weekend/after hours pager

## 2022-07-31 NOTE — Progress Notes (Addendum)
PROGRESS NOTE    Lisa Williams  [id number] DOB: [birth year] DOA: 07/29/2022 PCP: Kirk Ruths, MD   Assessment & Plan:   Principal Problem:   Multiple sclerosis exacerbation [facility]) Active Problems:   Hypercholesteremia   Hereditary and idiopathic neuropathy   Adiposity   Hypercholesterolemia without hypertriglyceridemia   Multiple sclerosis (HCC)   Chronic left shoulder pain   Essential hypertension   Spasticity   Neurogenic bladder   Weakness   Hypokalemia   Weakness of both lower extremities  Assessment and Plan: Multiple sclerosis exacerbation: s/p IV solumederol 1000mg  x 1. Solumedrol 1000mg  x 5 days as per neuro. Continue on home dose of siponimod & pt is taking her own home supply. Pt is refusing MRI   Weakness of both lower extremities: likely secondary to MS exacerbation. Pt refused MRI. PT recs SNF    Hypokalemia: WNL today    Spasticity: continue on home dose of tizanidine, gabapentin, baclofen  continue on home dose of baclofen, gabapentin, tizanidine   HTN: continue on irbesartan (substitution for pt's home med). Hydralazine prn    Hereditary and idiopathic neuropathy: continue on home dose of gabapentin    Obesity: BMI 36.6. Would benefit from weight loss    DVT prophylaxis: lovenox Code Status: full  Family Communication:  Disposition Plan: will d/c to SNF  Level of care: Telemetry Medical  Status is: Inpatient Remains inpatient appropriate because: MS flare   Consultants:    Procedures:   Antimicrobials:   Subjective: Pt c/o difficulty walking   Objective: Vitals:   07/30/22 0734 07/30/22 1517 07/30/22 2052 07/31/22 0423  BP: 125/72 129/61 (!) 152/57 122/61  Pulse: 63 64 66 (!) 49  Resp: 18 18 20 16   Temp: 98.1 F (36.7 C) 98.2 F (36.8 C) 98.3 F (36.8 C) 97.9 F (36.6 C)  TempSrc: Oral Oral Oral Oral  SpO2: 96% 98% 97% 97%  Weight:      Height:        Intake/Output Summary (Last 24 hours) at 07/31/2022  0746 Last data filed at 07/31/2022 0519 Gross per 24 hour  Intake 624.39 ml  Output 400 ml  Net 224.39 ml   Filed Weights   07/29/22 1512  Weight: 119.3 kg    Examination:  General exam: Appears comfortable  Respiratory system: clear breath sounds b/l  Cardiovascular system: S1/S2+. No rubs or clicks  Gastrointestinal system: Abd is soft, NT, obese, normal bowel sounds. Central nervous system: alert and oriented. Moves all extremities Psychiatry: Judgement and insight appears normal. Appropriate mood and affect    Data Reviewed: I have personally reviewed following labs and imaging studies  CBC: Recent Labs  Lab 07/29/22 1609 07/30/22 0501  WBC 7.6 6.4  NEUTROABS 6.3  --   HGB 13.0 13.4  HCT 37.3 38.3  MCV 76.7* 76.0*  PLT 242 [id number]   Basic Metabolic Panel: Recent Labs  Lab 07/29/22 1609 07/29/22 1930 07/30/22 0501 07/31/22 0528  NA 140  --  139 139  K 3.2*  --  4.2 4.3  CL 111  --  109 107  CO2 22  --  21* 22  GLUCOSE 93  --  156* 150*  BUN 20  --  22* 29*  CREATININE 0.48  --  0.64 0.63  CALCIUM 8.5*  --  9.2 9.4  MG  --  2.3 2.1  --   PHOS  --  3.1  --   --    GFR: Estimated Creatinine Clearance: 106.5 mL/min (by C-G  formula based on SCr of 0.63 mg/dL). Liver Function Tests: Recent Labs  Lab 07/29/22 1609  AST 42*  ALT 70*  ALKPHOS 124  BILITOT 0.9  PROT 6.7  ALBUMIN 3.8   No results for input(s): "LIPASE", "AMYLASE" in the last 168 hours. No results for input(s): "AMMONIA" in the last 168 hours. Coagulation Profile: No results for input(s): "INR", "PROTIME" in the last 168 hours. Cardiac Enzymes: No results for input(s): "CKTOTAL", "CKMB", "CKMBINDEX", "TROPONINI" in the last 168 hours. BNP (last 3 results) No results for input(s): "PROBNP" in the last 8760 hours. HbA1C: No results for input(s): "HGBA1C" in the last 72 hours. CBG: No results for input(s): "GLUCAP" in the last 168 hours. Lipid Profile: No results for input(s): "CHOL",  "HDL", "LDLCALC", "TRIG", "CHOLHDL", "LDLDIRECT" in the last 72 hours. Thyroid Function Tests: No results for input(s): "TSH", "T4TOTAL", "FREET4", "T3FREE", "THYROIDAB" in the last 72 hours. Anemia Panel: No results for input(s): "VITAMINB12", "FOLATE", "FERRITIN", "TIBC", "IRON", "RETICCTPCT" in the last 72 hours. Sepsis Labs: No results for input(s): "PROCALCITON", "LATICACIDVEN" in the last 168 hours.  No results found for this or any previous visit (from the past 240 hour(s)).       Radiology Studies: No results found.      Scheduled Meds:  baclofen  20 mg Oral TID   cholecalciferol  5,000 Units Oral Daily   enoxaparin (LOVENOX) injection  0.5 mg/kg Subcutaneous QHS   gabapentin  100 mg Oral BID   irbesartan  150 mg Oral Daily   oxybutynin  10 mg Oral Daily   Siponimod Fumarate  2 mg Oral Daily   tiZANidine  4 mg Oral BID   tiZANidine  8 mg Oral QHS   Continuous Infusions:  methylPREDNISolone (SOLU-MEDROL) injection 1,000 mg (07/30/22 1737)     LOS: 2 days    Time spent: 25 mins     Wyvonnia Dusky, MD [facility] Pager [phone]  If 7PM-7AM, please contact night-coverage [email or web address] 07/31/2022, 7:46 AM

## 2022-07-31 NOTE — Evaluation (Signed)
Occupational Therapy Evaluation Patient Details Name: Lisa Williams MRN: [id number] DOB: [birth year] Today's Date: 07/31/2022   History of Present Illness Lisa Williams is an 62 y.o. female with a PMHx of MS, chronic back pain, leg weakness with bilateral ankle-foot orthoses, neurogenic bladder, chronic galactorrhea, hereditary and idiopathic neuropathy (on Neurontin), muscle spasticity (on baclofen and tizanidine), HTN and hypercholesterolemia who presented to the ED yesterday afternoon with worsened BLE weakness and a new headache since the prior night, on a background of progressive BLE weakness over the past week with near-falls at home. She stated that she felt that she was having an MS exacerbation.   Clinical Impression   Patient presenting with decreased Ind in self care, balance, functional mobility/transfers, endurance, and safety awareness. Patient reports being mod I at baseline for self care and functional mobility. Pt utilizes manual wheelchair to get in and out of home and use of RW for ambulation within home and in community. Pt is pleasant and motivated throughout. Pt seated on EOB needing assistance to don B shoes and AFO's. Mod - max A of 2 to stand from elevated bed with RW. Pt unable to safely take a step and returns to seated position and laterally scoots into recliner chair with CGA at trunk and assistance to move B LEs. Hoyer sling placed under pt for staff to assist her back to bed safely.  Patient will benefit from acute OT to increase overall independence in the areas of ADLs, functional mobility, and safety awareness in order to safely discharge.     Recommendations for follow up therapy are one component of a multi-disciplinary discharge planning process, led by the attending physician.  Recommendations may be updated based on patient status, additional functional criteria and insurance authorization.   Assistance Recommended at Discharge Intermittent  Supervision/Assistance  Patient can return home with the following Two people to help with walking and/or transfers;A lot of help with bathing/dressing/bathroom    Functional Status Assessment  Patient has had a recent decline in their functional status and demonstrates the ability to make significant improvements in function in a reasonable and predictable amount of time.  Equipment Recommendations  Other (comment) (defer to next venue of care)       Precautions / Restrictions Precautions Precautions: Fall Restrictions Weight Bearing Restrictions: No      Mobility Bed Mobility               General bed mobility comments: seated on EOB when therapist arrives    Transfers Overall transfer level: Needs assistance Equipment used: Rolling walker (2 wheels) Transfers: Sit to/from Stand, Bed to chair/wheelchair/BSC Sit to Stand: Max assist, Mod assist, +2 physical assistance, From elevated surface          Lateral/Scoot Transfers: Mod assist General transfer comment: Able to stand maxA and bed features with just PT. ModA+2 with OT present. modA on LE's for scoot rtansfer to recliner. OT CGA on torso throughout.      Balance Overall balance assessment: Needs assistance Sitting-balance support: Bilateral upper extremity supported, Feet supported Sitting balance-Leahy Scale: Good     Standing balance support: Bilateral upper extremity supported, During functional activity, Reliant on assistive device for balance Standing balance-Leahy Scale: Poor Standing balance comment: Frequent LE's buckling relying on RW for support.                           ADL either performed or assessed with clinical judgement  ADL Overall ADL's : Needs assistance/impaired                                       General ADL Comments: Pt needing assistance for B shoes and AFOs     Vision Patient Visual Report: No change from baseline              Pertinent  Vitals/Pain Pain Assessment Pain Assessment: No/denies pain     Hand Dominance Right   Extremity/Trunk Assessment Upper Extremity Assessment Upper Extremity Assessment: Overall WFL for tasks assessed   Lower Extremity Assessment Lower Extremity Assessment: Generalized weakness       Communication Communication Communication: No difficulties   Cognition Arousal/Alertness: Awake/alert Behavior During Therapy: WFL for tasks assessed/performed Overall Cognitive Status: Within Functional Limits for tasks assessed                                                  Home Living Family/patient expects to be discharged to:: Private residence Living Arrangements: Parent;Other relatives Available Help at Discharge: Family;Available 24 hours/day Type of Home: House Home Access: Ramped entrance     Home Layout: Two level;Able to live on main level with bedroom/bathroom Alternate Level Stairs-Number of Steps: Pt does not go upstairs; unable to complete stairs at baseline.   Bathroom Shower/Tub: Teacher, early years/pre: Handicapped height Bathroom Accessibility: Yes   Home Equipment: Conservation officer, nature (2 wheels);Hospital bed;Wheelchair - manual;Tub bench   Additional Comments: Pt has ramped entrance into home in garage. Pt utilizes RW for car transfers, but wheelchair to get in/out of home. Pt ambulatory with RW at baseline. Reported handicap accessible bathroom set up, and pt lives on 1st floor unable to ascend stairs at baseline.      Prior Functioning/Environment Prior Level of Function : Driving;Independent/Modified Independent               ADLs Comments: MOD I with bathing, dressing, cleaning, cooking        OT Problem List: Decreased strength;Decreased activity tolerance;Decreased safety awareness;Impaired balance (sitting and/or standing);Decreased knowledge of use of DME or AE      OT Treatment/Interventions: Self-care/ADL  training;Therapeutic exercise;Therapeutic activities;Energy conservation;DME and/or AE instruction;Patient/family education;Balance training    OT Goals(Current goals can be found in the care plan section) Acute Rehab OT Goals Patient Stated Goal: to get stronger before returning home OT Goal Formulation: With patient Time For Goal Achievement: 08/14/22 Potential to Achieve Goals: Fair ADL Goals Pt Will Perform Grooming: with min assist;standing Pt Will Perform Lower Body Dressing: with min assist;sit to/from stand Pt Will Transfer to Toilet: with min assist;stand pivot transfer;bedside commode Pt Will Perform Toileting - Clothing Manipulation and hygiene: with min assist;sit to/from stand  OT Frequency: Min 2X/week    Co-evaluation   Reason for Co-Treatment: Complexity of the patient's impairments (multi-system involvement);For patient/therapist safety;To address functional/ADL transfers PT goals addressed during session: Mobility/safety with mobility;Strengthening/ROM OT goals addressed during session: ADL's and self-care;Strengthening/ROM      AM-PAC OT "6 Clicks" Daily Activity     Outcome Measure Help from another person eating meals?: None Help from another person taking care of personal grooming?: A Little Help from another person toileting, which includes using toliet, bedpan, or urinal?: A Lot Help from  another person bathing (including washing, rinsing, drying)?: A Lot Help from another person to put on and taking off regular upper body clothing?: A Little Help from another person to put on and taking off regular lower body clothing?: A Lot 6 Click Score: 16   End of Session Equipment Utilized During Treatment: Rolling walker (2 wheels) Nurse Communication: Mobility status  Activity Tolerance: Patient tolerated treatment well Patient left: in bed;with call bell/phone within reach;with bed alarm set  OT Visit Diagnosis: Unsteadiness on feet (R26.81);Repeated falls  (R29.6)                Time: [id number] OT Time Calculation (min): 17 min Charges:  OT General Charges $OT Visit: 1 Visit OT Evaluation $OT Eval Moderate Complexity: 1 [address] [place], OTR/L , CBIS ascom [phone]  07/31/22, 1:21 PM

## 2022-07-31 NOTE — TOC Initial Note (Addendum)
Transition of Care [facility]) - Initial/Assessment Note    Patient Details  Name: Lisa Williams MRN: [id number] Date of Birth: 10-08-1960  Transition of Care [facility]) CM/SW Contact:    Candie Chroman, LCSW Phone Number: 07/31/2022, 12:17 PM  Clinical Narrative:  CSW met with patient. No supports at bedside. CSW introduced role and explained that PT recommendations would be discussed. Patient is agreeable to SNF placement and wants to go to [facility]. She has been here twice before. Sent referral and left voicemail for admissions coordinator. No further concerns. CSW encouraged patient to contact CSW as needed. CSW will continue to follow patient for support and facilitate discharge to SNF once medically stable.                1:54 pm: [facility] has offered a bed. Per MD, potential discharge on Wednesday pending completion of IV steroids. Left voicemail for SNF admissions coordinator to confirm they will have a bed that day before starting insurance authorization.  2:32 pm: Tried calling admissions coordinator again. Did not leave another voicemail.  4:01 pm: Spoke to admissions coordinator who confirmed they can accept patient on Wednesday if stable. Left voicemail for Marlowe Kays at [facility] to start insurance authorization. Patient is aware.  Expected Discharge Plan: Skilled Nursing Facility Barriers to Discharge: Continued Medical Work up   Patient Goals and CMS Choice            Expected Discharge Plan and Services     Post Acute Care Choice: [place] Living arrangements for the past 2 months: Single Family Home                                      Prior Living Arrangements/Services Living arrangements for the past 2 months: Single Family Home Lives with:: Relatives, Parents Patient language and need for interpreter reviewed:: Yes Do you feel safe going back to the place where you live?: Yes      Need for Family  Participation in Patient Care: Yes (Comment) Care giver support system in place?: Yes (comment)   Criminal Activity/Legal Involvement Pertinent to Current Situation/Hospitalization: No - Comment as needed  Activities of Daily Living Home Assistive Devices/Equipment: Gilford Rile (specify type) ADL Screening (condition at time of admission) Patient's cognitive ability adequate to safely complete daily activities?: Yes Is the patient deaf or have difficulty hearing?: No Does the patient have difficulty seeing, even when wearing glasses/contacts?: No Does the patient have difficulty concentrating, remembering, or making decisions?: No Patient able to express need for assistance with ADLs?: Yes Does the patient have difficulty dressing or bathing?: No Independently performs ADLs?: Yes (appropriate for developmental age) Communication: Independent Dressing (OT): Independent Grooming: Independent Feeding: Independent Bathing: Independent Toileting: Independent In/Out Bed: Independent Walks in Home: Independent with device (comment) Does the patient have difficulty walking or climbing stairs?: Yes Weakness of Legs: Both Weakness of Arms/Hands: None  Permission Sought/Granted Permission sought to share information with : Facility Art therapist granted to share information with : Yes, Verbal Permission Granted     Permission granted to share info w AGENCY: [facility]        Emotional Assessment Appearance:: Appears stated age Attitude/Demeanor/Rapport: Engaged, Gracious Affect (typically observed): Accepting, Appropriate, Calm, Pleasant Orientation: : Oriented to Self, Oriented to Place, Oriented to  Time, Oriented to Situation Alcohol / Substance Use: Not Applicable Psych Involvement: No (comment)  Admission diagnosis:  Multiple sclerosis exacerbation ([place]) [G35] Patient Active Problem List   Diagnosis Date Noted   Hypokalemia 07/29/2022   Weakness of  both lower extremities 07/29/2022   Abscess of left groin 11/15/2021   Abnormal LFTs 11/15/2021   Acute respiratory disease due to COVID-19 virus 11/21/2020   Weakness    Hypoalbuminemia due to protein-calorie malnutrition (HCC)    Neurogenic bowel    Neurogenic bladder    Labile blood pressure    Neuropathic pain    Abscess of female pelvis    SVT (supraventricular tachycardia)    Radial styloid tenosynovitis 03/12/2018   Wheelchair confinement 02/27/2018   Localized osteoporosis with current pathological fracture with routine healing 01/19/2017   Wrist fracture 01/16/2017   Sprain of ankle 03/23/2016   Closed fracture of lateral malleolus 03/16/2016   Health care maintenance 01/24/2016   Blood pressure elevated without history of HTN 10/25/2015   Essential hypertension 10/25/2015   Multiple sclerosis ([place]) 10/02/2015   Chronic left shoulder pain 07/19/2015   Multiple sclerosis exacerbation ([place]) 07/14/2015   MS (multiple sclerosis) ([place]) 11/26/2014   Increased body mass index 11/26/2014   HPV test positive 11/26/2014   Status post laparoscopic supracervical hysterectomy 11/26/2014   Galactorrhea 11/26/2014   Back ache 05/21/2014   Adiposity 05/21/2014   Disordered sleep 05/21/2014   Muscle spasticity 05/21/2014   Spasticity 05/21/2014   Calculus of kidney 99991111   Renal colic 99991111   Hypercholesteremia 08/19/2013   Hereditary and idiopathic neuropathy 08/19/2013   Hypercholesterolemia without hypertriglyceridemia 08/19/2013   Bladder infection, chronic 07/25/2012   Disorder of bladder function 07/25/2012   Incomplete bladder emptying 07/25/2012   Microscopic hematuria 07/25/2012   Right upper quadrant pain 07/25/2012   PCP:  Kirk Ruths, MD Pharmacy:   [facility] ([place], [address] [place] [address] Phone: [phone] Fax: [phone]  [facility] [place],  [place] [place] [address] Phone: [phone] Fax: [phone]  [id number] [place], [address] [place] [address] Phone: [phone] Fax: [phone]     Social Determinants of Health (SDOH) Social History: SDOH Screenings   Food Insecurity: No Food Insecurity (07/29/2022)  Housing: Low Risk  (07/29/2022)  Transportation Needs: No Transportation Needs (07/29/2022)  Utilities: Not At Risk (07/29/2022)  Tobacco Use: Medium Risk (07/29/2022)   SDOH Interventions:     Readmission Risk Interventions     No data to display

## 2022-07-31 NOTE — NC FL2 (Signed)
[place] LEVEL OF CARE FORM     IDENTIFICATION  Patient Name: Lisa Williams Sportsman Birthdate: Jan 20, 1961 Sex: female Admission Date (Current Location): 07/29/2022  [facility] and [place] Number:  Engineering geologist and Address:  [facility], [address], [place], [place] [address]      Provider Number: [id number]  Attending Physician Name and Address:  Wyvonnia Dusky, MD  Relative Name and Phone Number:       Current Level of Care: Hospital Recommended Level of Care: [place] Prior Approval Number:    Date Approved/Denied:   PASRR Number: [id number]  Discharge Plan: SNF    Current Diagnoses: Patient Active Problem List   Diagnosis Date Noted   Hypokalemia 07/29/2022   Weakness of both lower extremities 07/29/2022   Abscess of left groin 11/15/2021   Abnormal LFTs 11/15/2021   Acute respiratory disease due to COVID-19 virus 11/21/2020   Weakness    Hypoalbuminemia due to protein-calorie malnutrition [facility])    Neurogenic bowel    Neurogenic bladder    Labile blood pressure    Neuropathic pain    Abscess of female pelvis    SVT (supraventricular tachycardia)    Radial styloid tenosynovitis 03/12/2018   Wheelchair confinement 02/27/2018   Localized osteoporosis with current pathological fracture with routine healing 01/19/2017   Wrist fracture 01/16/2017   Sprain of ankle 03/23/2016   Closed fracture of lateral malleolus 03/16/2016   Health care maintenance 01/24/2016   Blood pressure elevated without history of HTN 10/25/2015   Essential hypertension 10/25/2015   Multiple sclerosis ([place]) 10/02/2015   Chronic left shoulder pain 07/19/2015   Multiple sclerosis exacerbation ([place]) 07/14/2015   MS (multiple sclerosis) ([place]) 11/26/2014   Increased body mass index 11/26/2014   HPV test positive 11/26/2014   Status post laparoscopic supracervical hysterectomy 11/26/2014   Galactorrhea 11/26/2014    Back ache 05/21/2014   Adiposity 05/21/2014   Disordered sleep 05/21/2014   Muscle spasticity 05/21/2014   Spasticity 05/21/2014   Calculus of kidney 99991111   Renal colic 99991111   Hypercholesteremia 08/19/2013   Hereditary and idiopathic neuropathy 08/19/2013   Hypercholesterolemia without hypertriglyceridemia 08/19/2013   Bladder infection, chronic 07/25/2012   Disorder of bladder function 07/25/2012   Incomplete bladder emptying 07/25/2012   Microscopic hematuria 07/25/2012   Right upper quadrant pain 07/25/2012    Orientation RESPIRATION BLADDER Height & Weight     Self, Time, Situation, Place  Normal Incontinent, External catheter Weight: 263 lb (119.3 kg) Height:  5\' 11"  (180.3 cm)  BEHAVIORAL SYMPTOMS/MOOD NEUROLOGICAL BOWEL NUTRITION STATUS   (None)  (None) Continent Diet (Heart healthy)  AMBULATORY STATUS COMMUNICATION OF NEEDS Skin   Extensive Assist Verbally Normal                       Personal Care Assistance Level of Assistance  Bathing, Feeding, Dressing Bathing Assistance: Maximum assistance Feeding assistance: Limited assistance Dressing Assistance: Maximum assistance     Functional Limitations Info  Sight, Hearing, Speech Sight Info: Adequate Hearing Info: Adequate Speech Info: Adequate    SPECIAL CARE FACTORS FREQUENCY  PT (By licensed PT), OT (By licensed OT)     PT Frequency: 5 x week OT Frequency: 5 x week            Contractures Contractures Info: Not present    Additional Factors Info  Code Status, Allergies Code Status Info: Full code Allergies Info: Amlodipine, Betadine (Povidone-iodine), Povidone Iodine  Current Medications (07/31/2022):  This is the current hospital active medication list Current Facility-Administered Medications  Medication Dose Route Frequency Provider Last Rate Last Admin   acetaminophen (TYLENOL) tablet 650 mg  650 mg Oral Q6H PRN Cox, Amy N, DO       Or   acetaminophen (TYLENOL)  suppository 650 mg  650 mg Rectal Q6H PRN Cox, Amy N, DO       baclofen (LIORESAL) tablet 20 mg  20 mg Oral TID Cox, Amy N, DO   20 mg at 07/31/22 0847   cholecalciferol (VITAMIN D3) 25 MCG (1000 UNIT) tablet 5,000 Units  5,000 Units Oral Daily Cox, Amy N, DO   5,000 Units at 07/31/22 0847   enoxaparin (LOVENOX) injection 60 mg  0.5 mg/kg Subcutaneous QHS Cox, Amy N, DO   60 mg at 07/30/22 2147   gabapentin (NEURONTIN) capsule 100 mg  100 mg Oral BID Cox, Amy N, DO   100 mg at 07/31/22 0847   hydrALAZINE (APRESOLINE) tablet 10 mg  10 mg Oral Q8H PRN Cox, Amy N, DO   10 mg at 07/29/22 2144   irbesartan (AVAPRO) tablet 150 mg  150 mg Oral Daily Cox, Amy N, DO   150 mg at 07/31/22 0847   melatonin tablet 5 mg  5 mg Oral QHS PRN Cox, Amy N, DO       methylPREDNISolone sodium succinate (SOLU-MEDROL) 1,000 mg in sodium chloride 0.9 % 50 mL IVPB  1,000 mg Intravenous Q24H Kerney Elbe, MD 66 mL/hr at 07/30/22 1737 1,000 mg at 07/30/22 1737   ondansetron (ZOFRAN) tablet 4 mg  4 mg Oral Q6H PRN Cox, Amy N, DO       Or   ondansetron (ZOFRAN) injection 4 mg  4 mg Intravenous Q6H PRN Cox, Amy N, DO       oxybutynin (DITROPAN-XL) 24 hr tablet 10 mg  10 mg Oral Daily Cox, Amy N, DO   10 mg at 07/31/22 0846   senna-docusate (Senokot-S) tablet 1 tablet  1 tablet Oral QHS PRN Cox, Amy N, DO       Siponimod Fumarate TABS 2 mg  2 mg Oral Daily Wyvonnia Dusky, MD   2 mg at 07/31/22 0848   [START ON 08/01/2022] tiZANidine (ZANAFLEX) tablet 4 mg  4 mg Oral Daily Wyvonnia Dusky, MD       tiZANidine (ZANAFLEX) tablet 8 mg  8 mg Oral QHS Darrick Penna, RPH   8 mg at 07/30/22 2147     Discharge Medications: Please see discharge summary for a list of discharge medications.  Relevant Imaging Results:  Relevant Lab Results:   Additional Information SS#: [id number]  Candie Chroman, LCSW

## 2022-08-01 ENCOUNTER — Ambulatory Visit: Payer: PPO

## 2022-08-01 DIAGNOSIS — R531 Weakness: Secondary | ICD-10-CM | POA: Diagnosis not present

## 2022-08-01 DIAGNOSIS — I10 Essential (primary) hypertension: Secondary | ICD-10-CM | POA: Diagnosis not present

## 2022-08-01 DIAGNOSIS — G35 Multiple sclerosis: Secondary | ICD-10-CM | POA: Diagnosis not present

## 2022-08-01 LAB — BASIC METABOLIC PANEL
Anion gap: 5 (ref 5–15)
BUN: 33 mg/dL — ABNORMAL HIGH (ref 6–20)
CO2: 24 mmol/L (ref 22–32)
Calcium: 8.9 mg/dL (ref 8.9–10.3)
Chloride: 109 mmol/L (ref 98–111)
Creatinine, Ser: 0.7 mg/dL (ref 0.44–1.00)
GFR, Estimated: >60 mL/min (ref >60)
Glucose, Bld: 140 mg/dL — ABNORMAL HIGH (ref 70–99)
Potassium: 4.2 mmol/L (ref 3.5–5.1)
Sodium: 138 mmol/L (ref 135–145)

## 2022-08-01 NOTE — TOC Progression Note (Signed)
Transition of Care [facility]) - Progression Note    Patient Details  Name: Lisa Williams MRN: [id number] Date of Birth: 08-Aug-1960  [facility]) CM/SW Contact  Beverly Sessions, RN Phone Number: 08/01/2022, 9:33 AM  Clinical Narrative:    Apolonio Schneiders started for SNF and [facility] transport.   Spoke with rep Colletta [place] at [facility]   Expected Discharge Plan: [place] Barriers to Discharge: Continued Medical Work up  Expected Discharge Plan and [place] Choice: [place] Living arrangements for the past 2 months: Single Family Home                                       Social Determinants of Health (SDOH) Interventions SDOH Screenings   Food Insecurity: No Food Insecurity (07/29/2022)  Housing: Low Risk  (07/29/2022)  Transportation Needs: No Transportation Needs (07/29/2022)  Utilities: Not At Risk (07/29/2022)  Tobacco Use: Medium Risk (07/29/2022)    Readmission Risk Interventions     No data to display

## 2022-08-01 NOTE — Care Management Important Message (Signed)
Important Message  Patient Details  Name: Lisa Williams MRN: [id number] Date of Birth: 1960/12/28   Medicare Important Message Given:  Yes     Dannette Barbara 08/01/2022, 11:26 AM

## 2022-08-01 NOTE — Progress Notes (Signed)
Neurology progress note  S: BLE strength improved since starting steroids. Was able to stand and pivot today with PT. Today is day 4 of steroids. No new complaints. Not able to undergo MRI 2/2 severe claustrophobia.   O:  Vitals:   08/01/22 0800 08/01/22 1620  BP: (!) 148/72 (!) 155/63  Pulse: (!) 49 (!) 51  Resp: 18 16  Temp: 98 F (36.7 C) 97.8 F (36.6 C)  SpO2: 96% 100%   Physical Exam HEENT-  [place]/AT  Lungs- Respirations unlabored Extremities- Warm and well-perfused.    Neurological Examination Mental Status: Awake and alert. Fully oriented. Thought content appropriate.  Speech fluent without evidence of aphasia.  Able to follow all commands without difficulty. Cranial Nerves: II: Temporal visual fields intact with no extinction to DSS. PERRL. No RAPD. III,IV, VI: No ptosis. EOMI. No nystagmus. V: Temp sensation equal bilaterally VII: Smile symmetric VIII: Hearing intact to voice IX,X: No hypophonia or hoarseness XI: Symmetric XII: Midline tongue extension Motor: RUE: 4+/5 proximally and distally LUE: 5/5 RLE: 3/5 HF, KF, KE, ADF and APF with decreased ROM at ankle LLE 3/5 HF and KF. 3/5 KE. 3/5 ADF and APF with decreased ROM at ankle.  Sensory: Temp and FT intact proximally and distally x 4. RUE with extinction to DSS. Deep Tendon Reflexes: 1+ left triceps, biceps and brachioradialis. 2+ right triceps, biceps and brachioradialis. Brisk low amplitude patellars. 0 bilateral achilles. Toes upgoing bilaterally.   Positive Hoffman's sign on the right, negative on the left.  Cerebellar: No ataxia with FNF bilaterally. Unable to assess BLE due to weakness.  Gait: Unable to assess  Assessment: 62 year old female with multiple sclerosis diagnosed approximately 25 years ago who presents with acute onset of BLE weakness.  - Exam reveals findings referable to the spinal cord (as expected given spinal cord lesions seen previously on MRI) with little to no evidence for clinically  disabling brain demyelination, although previous MRI brain as documented in HPI did show old demyelinating lesions.  - Overall presentation is most consistent with an acute MS exacerbation - On her fourth dose of IV Solumedrol at time of today's examination. Strength has improved since starting steroids   Recommendations: - Continue IV Solumedrol at 1000 mg qd for a total of 5 days. Last dose on Wednesday.  - The patient is unable to tolerate MRI brain 2/2 severe claustrophobia. Scanning is unlikely to change management.  - Continue current oral disease-modifying treatment with daily siponimod.  - Continue her medications for spasticity and neuropathy - PT and OT consults - Will need outpatient follow up scheduled with Dr. Felecia Shelling [facility], [facility]) at the time of discharge.  - Monitor CBG, WBC and chem7 during steroid treatment - Patient requests steroid taper after solumedrol regimen is completed, we will give her taper at discharge - Will continue to follow  Lisa Monks, MD [facility] [phone]  If [place], please page neurology on call as listed in [place].

## 2022-08-01 NOTE — Progress Notes (Signed)
PROGRESS NOTE   HPI was taken from Dr. Tobie Poet: [name] is a 62 year old female with history of multiple sclerosis, hypertension, neurogenic bladder, hidradenitis, who presents emergency department for chief concerns of headache and leg weakness.   Vitals in the ED showed temperature of 98.8, respiration rate of 18, heart rate of 80, blood pressure 182/94, SpO2 of 98% on room air.   Serum sodium 140, potassium 3.2, chloride 111, bicarb 22, BUN of 20, serum creatinine of 2.48, nonfasting glucose 93, EGFR greater than 60, WBC 7.6, hemoglobin 13, platelets of 242.   ED treatment: Solu-Medrol 1000 mg IV one-time dose. ------------------------- At bedside, she is able to tell me her name, age, current location.    She reprots she developed a headache and leg weakness. She states the headache is the normal ones she occasionally gets and she took aspirin and tylenol at home which helped her. She reprot sthe headache has improved significantly already.    She reports the weakness started at 2 p and quickly after, she was not able to walk. She walks with brace on both sides.    As per Dr. Jimmye Norman 3/24-3/26/24: Pt was found to have MS exacerbation and is being treated w/ IV steroids x 5 days. Pt will continue on home dose of siponimod (pt is taking her own home supply). PT/OT evaluated the pt and recs SNF. Pt is agreeable to SNF. (See CM's note)  Lisa Williams  [id number] DOB: 03-12-1961 DOA: 07/29/2022 PCP: Kirk Ruths, MD   Assessment & Plan:   Principal Problem:   Multiple sclerosis exacerbation [facility]) Active Problems:   Hypercholesteremia   Hereditary and idiopathic neuropathy   Adiposity   Hypercholesterolemia without hypertriglyceridemia   Multiple sclerosis (HCC)   Chronic left shoulder pain   Essential hypertension   Spasticity   Neurogenic bladder   Weakness   Hypokalemia   Weakness of both lower extremities  Assessment and Plan: Multiple  sclerosis exacerbation: continue on IV solumedrol 1000mg  x 5 days as per neuro. Continue on home dose of siponimod & pt is taking her own home supply. Pt refused MRI as she was told by her outpatient neurologist that it was not needed currently as management of this exacerbation would not be changed by the results   Weakness of both lower extremities: likely secondary to MS exacerbation    Hypokalemia:  WNL    Spasticity: continue on home dose of tizanidine, gabapentin, baclofen   HTN: continue on irbesartan (substitution for pt's home med). Hydralazine prn    Hereditary and idiopathic neuropathy:  continue on home dose of gabapentin   Obesity: BMI 36.6. Would benefit from weight loss   DVT prophylaxis: lovenox Code Status: full  Family Communication:  Disposition Plan: will d/c to SNF  Level of care: Telemetry Medical  Status is: Inpatient Remains inpatient appropriate because: MS flare   Consultants:    Procedures:   Antimicrobials:   Subjective: Pt c/o b/l LE weakness   Objective: Vitals:   07/31/22 1523 07/31/22 1939 08/01/22 0316 08/01/22 0800  BP: 134/64 137/65 (!) 108/50 (!) 148/72  Pulse: (!) 55 (!) 58 (!) 47 (!) 49  Resp: 18 18 18 18   Temp: 98.3 F (36.8 C) 98.3 F (36.8 C) 97.9 F (36.6 C) 98 F (36.7 C)  TempSrc:      SpO2: 99% 98% 96% 96%  Weight:      Height:        Intake/Output Summary (Last 24 hours) at 08/01/2022  [id number] Last data filed at 08/01/2022 [id number] Gross per 24 hour  Intake 480 ml  Output --  Net 480 ml   Filed Weights   07/29/22 1512  Weight: 119.3 kg    Examination:  General exam: Appears calm & comfortable   Respiratory system: clear breath sounds b/l  Cardiovascular system: S1 & S2+. No rubs or clicks  Gastrointestinal system: Abd is soft, NT, obese & hypoactive bowel sounds. Central nervous system: Alert and oriented. Moves all extremities  Psychiatry: Judgement and insight appear normal. Appropriate mood and  affect    Data Reviewed: I have personally reviewed following labs and imaging studies  CBC: Recent Labs  Lab 07/29/22 1609 07/30/22 0501  WBC 7.6 6.4  NEUTROABS 6.3  --   HGB 13.0 13.4  HCT 37.3 38.3  MCV 76.7* 76.0*  PLT 242 [id number]   Basic Metabolic Panel: Recent Labs  Lab 07/29/22 1609 07/29/22 1930 07/30/22 0501 07/31/22 0528 08/01/22 0657  NA 140  --  139 139 138  K 3.2*  --  4.2 4.3 4.2  CL 111  --  109 107 109  CO2 22  --  21* 22 24  GLUCOSE 93  --  156* 150* 140*  BUN 20  --  22* 29* 33*  CREATININE 0.48  --  0.64 0.63 0.70  CALCIUM 8.5*  --  9.2 9.4 8.9  MG  --  2.3 2.1  --   --   PHOS  --  3.1  --   --   --    GFR: Estimated Creatinine Clearance: 106.5 mL/min (by C-G formula based on SCr of 0.7 mg/dL). Liver Function Tests: Recent Labs  Lab 07/29/22 1609  AST 42*  ALT 70*  ALKPHOS 124  BILITOT 0.9  PROT 6.7  ALBUMIN 3.8   No results for input(s): "LIPASE", "AMYLASE" in the last 168 hours. No results for input(s): "AMMONIA" in the last 168 hours. Coagulation Profile: No results for input(s): "INR", "PROTIME" in the last 168 hours. Cardiac Enzymes: No results for input(s): "CKTOTAL", "CKMB", "CKMBINDEX", "TROPONINI" in the last 168 hours. BNP (last 3 results) No results for input(s): "PROBNP" in the last 8760 hours. HbA1C: No results for input(s): "HGBA1C" in the last 72 hours. CBG: No results for input(s): "GLUCAP" in the last 168 hours. Lipid Profile: No results for input(s): "CHOL", "HDL", "LDLCALC", "TRIG", "CHOLHDL", "LDLDIRECT" in the last 72 hours. Thyroid Function Tests: No results for input(s): "TSH", "T4TOTAL", "FREET4", "T3FREE", "THYROIDAB" in the last 72 hours. Anemia Panel: No results for input(s): "VITAMINB12", "FOLATE", "FERRITIN", "TIBC", "IRON", "RETICCTPCT" in the last 72 hours. Sepsis Labs: No results for input(s): "PROCALCITON", "LATICACIDVEN" in the last 168 hours.  No results found for this or any previous visit (from  the past 240 hour(s)).       Radiology Studies: No results found.      Scheduled Meds:  baclofen  20 mg Oral TID   cholecalciferol  5,000 Units Oral Daily   enoxaparin (LOVENOX) injection  0.5 mg/kg Subcutaneous QHS   gabapentin  100 mg Oral BID   irbesartan  150 mg Oral Daily   multivitamin with minerals  1 tablet Oral Daily   oxybutynin  10 mg Oral Daily   Siponimod Fumarate  2 mg Oral Daily   tiZANidine  4 mg Oral Daily   tiZANidine  8 mg Oral QHS   Continuous Infusions:  methylPREDNISolone (SOLU-MEDROL) injection 1,000 mg (07/31/22 1750)     LOS: 3 days  Time spent: 25 mins     Wyvonnia Dusky, MD [facility] Pager [phone]  If 7PM-7AM, please contact night-coverage [email or web address] 08/01/2022, 8:26 AM

## 2022-08-01 NOTE — Progress Notes (Signed)
Physical Therapy Treatment Patient Details Name: Lisa Williams MRN: [id number] DOB: Sep 21, 1960 Today's Date: 08/01/2022   History of Present Illness Lisa Williams is an 62 y.o. female with a PMHx of MS, chronic back pain, leg weakness with bilateral ankle-foot orthoses, neurogenic bladder, chronic galactorrhea, hereditary and idiopathic neuropathy (on Neurontin), muscle spasticity (on baclofen and tizanidine), HTN and hypercholesterolemia who presented to the ED yesterday afternoon with worsened BLE weakness and a new headache since the prior night, on a background of progressive BLE weakness over the past week with near-falls at home. She stated that she felt that she was having an MS exacerbation.    PT Comments    Pt was long sitting in bed upon arriving. She is A and O x 4. Remains motivated and is well known by Chief Strategy Officer prior to this admission. She presents with deficits far from her normal baseline. Pt was able to exit L side of bed with min assist with HOB elevated and heavy use of bed rails. Stood from elevated bed height with +1 assist but requires +2 assistance to stand from lower recliner surface. Pt fatigues quickly with all standing exercises and activity but overall tolerated session well. She stood 2 x EOB and then 2 x from recliner. In standing, exercises focused on improving lateral wt shift to allow opposite LE advancement. She did ambulate to/from EOB > recliner with +2 assistance for safety however really only requires +2 assistance for transfers. During ambulation min assist of one but 2nd person for safety. Pt's activity tolerance greatly impacts session progression. When standing from recliner, perform marching in place 10 X R then 10 x L. Overall pt is progressing this admission. Highly recommend continued skilled PT at DC to maximize independence and safety with all ADLs.    Recommendations for follow up therapy are one component of a multi-disciplinary  discharge planning process, led by the attending physician.  Recommendations may be updated based on patient status, additional functional criteria and insurance authorization.     Assistance Recommended at Discharge Frequent or constant Supervision/Assistance  Patient can return home with the following A lot of help with walking and/or transfers;A lot of help with bathing/dressing/bathroom;Help with stairs or ramp for entrance   Equipment Recommendations  None recommended by PT       Precautions / Restrictions Precautions Precautions: Fall Restrictions Weight Bearing Restrictions: No     Mobility  Bed Mobility Overal bed mobility: Needs Assistance Bed Mobility: Supine to Sit  Supine to sit: Min assist, HOB elevated  General bed mobility comments: HOB elevated. Min assist to exit L side of bed. increased time required to perform    Transfers Overall transfer level: Needs assistance Equipment used: Rolling walker (2 wheels) Transfers: Sit to/from Stand Sit to Stand: From elevated surface, Mod assist, +2 safety/equipment, +2 physical assistance  General transfer comment: Pt was able to stand with +1 assistance from EOB elevated but +2 mod-max to stand form lower recliner surface.    Ambulation/Gait Ambulation/Gait assistance: Min assist, Mod assist, +2 safety/equipment Gait Distance (Feet): 5 Feet Assistive device: Rolling walker (2 wheels) (bariatric) Gait Pattern/deviations: Step-to pattern Gait velocity: decreased  General Gait Details: Pt was able to ambulate from EOB to recliner with +2 assistance for safety however min- mod of one to  take steps. Requires assistance to for lateral wt shift to allow opposite LE advancement. Pt tends to keep knees in extension throughout standing activity, even during gait very short distance   Balance Overall  balance assessment: Needs assistance Sitting-balance support: Bilateral upper extremity supported, Feet supported Sitting  balance-Leahy Scale: Good     Standing balance support: Bilateral upper extremity supported, During functional activity, Reliant on assistive device for balance Standing balance-Leahy Scale: Poor Standing balance comment: Reliant on RW for all standing activity       Cognition Arousal/Alertness: Awake/alert Behavior During Therapy: WFL for tasks assessed/performed Overall Cognitive Status: Within Functional Limits for tasks assessed    General Comments: pt is A and O x 4. Motivated and coperative           General Comments General comments (skin integrity, edema, etc.): Pt was able to stand from recliner surface with +2 assistance and perform marching on place with focus on wt shift prior to lifting opposite LE.      Pertinent Vitals/Pain Pain Assessment Pain Assessment: No/denies pain     PT Goals (current goals can now be found in the care plan section) Acute Rehab PT Goals Patient Stated Goal: Rehab then home Progress towards PT goals: Progressing toward goals    Frequency    Min 2X/week      PT Plan Current plan remains appropriate    Co-evaluation     PT goals addressed during session: Mobility/safety with mobility;Strengthening/ROM        AM-PAC PT "6 Clicks" Mobility   Outcome Measure  Help needed turning from your back to your side while in a flat bed without using bedrails?: A Lot Help needed moving from lying on your back to sitting on the side of a flat bed without using bedrails?: A Lot Help needed moving to and from a bed to a chair (including a wheelchair)?: A Lot Help needed standing up from a chair using your arms (e.g., wheelchair or bedside chair)?: Total Help needed to walk in hospital room?: Total Help needed climbing 3-5 steps with a railing? : Total 6 Click Score: 9    End of Session Equipment Utilized During Treatment: Gait belt Activity Tolerance: Patient tolerated treatment well Patient left: in chair;with call bell/phone within  reach;with chair alarm set;with nursing/sitter in room;Other (comment) Nurse Communication: Mobility status PT Visit Diagnosis: Unsteadiness on feet (R26.81);Other abnormalities of gait and mobility (R26.89);Muscle weakness (generalized) (M62.81);History of falling (Z91.81);Difficulty in walking, not elsewhere classified (R26.2)     Time: [id number] PT Time Calculation (min) (ACUTE ONLY): 30 min  Charges:  $Therapeutic Activity: 8-22 mins $Neuromuscular Re-education: 8-22 mins                    Julaine Fusi PTA 08/01/22, 2:46 PM

## 2022-08-02 DIAGNOSIS — R2689 Other abnormalities of gait and mobility: Secondary | ICD-10-CM | POA: Diagnosis not present

## 2022-08-02 DIAGNOSIS — G35 Multiple sclerosis: Secondary | ICD-10-CM | POA: Diagnosis not present

## 2022-08-02 DIAGNOSIS — R1311 Dysphagia, oral phase: Secondary | ICD-10-CM | POA: Diagnosis not present

## 2022-08-02 DIAGNOSIS — H2513 Age-related nuclear cataract, bilateral: Secondary | ICD-10-CM | POA: Diagnosis not present

## 2022-08-02 DIAGNOSIS — M6281 Muscle weakness (generalized): Secondary | ICD-10-CM | POA: Diagnosis not present

## 2022-08-02 DIAGNOSIS — R262 Difficulty in walking, not elsewhere classified: Secondary | ICD-10-CM | POA: Diagnosis not present

## 2022-08-02 DIAGNOSIS — R41841 Cognitive communication deficit: Secondary | ICD-10-CM | POA: Diagnosis not present

## 2022-08-02 DIAGNOSIS — M810 Age-related osteoporosis without current pathological fracture: Secondary | ICD-10-CM | POA: Diagnosis not present

## 2022-08-02 DIAGNOSIS — I10 Essential (primary) hypertension: Secondary | ICD-10-CM | POA: Diagnosis not present

## 2022-08-02 DIAGNOSIS — Z743 Need for continuous supervision: Secondary | ICD-10-CM | POA: Diagnosis not present

## 2022-08-02 DIAGNOSIS — R600 Localized edema: Secondary | ICD-10-CM | POA: Diagnosis not present

## 2022-08-02 DIAGNOSIS — M62838 Other muscle spasm: Secondary | ICD-10-CM | POA: Diagnosis not present

## 2022-08-02 DIAGNOSIS — K592 Neurogenic bowel, not elsewhere classified: Secondary | ICD-10-CM | POA: Diagnosis not present

## 2022-08-02 DIAGNOSIS — R2681 Unsteadiness on feet: Secondary | ICD-10-CM | POA: Diagnosis not present

## 2022-08-02 LAB — BASIC METABOLIC PANEL
Anion gap: 5 (ref 5–15)
BUN: 33 mg/dL — ABNORMAL HIGH (ref 6–20)
CO2: 27 mmol/L (ref 22–32)
Calcium: 8.9 mg/dL (ref 8.9–10.3)
Chloride: 108 mmol/L (ref 98–111)
Creatinine, Ser: 0.68 mg/dL (ref 0.44–1.00)
GFR, Estimated: >60 mL/min (ref >60)
Glucose, Bld: 139 mg/dL — ABNORMAL HIGH (ref 70–99)
Potassium: 4.7 mmol/L (ref 3.5–5.1)
Sodium: 140 mmol/L (ref 135–145)

## 2022-08-02 MED ORDER — SODIUM CHLORIDE 0.9 % IV SOLN
1000.0000 mg | Freq: Once | INTRAVENOUS | Status: DC
  Filled 2022-08-02: qty 16

## 2022-08-02 MED ORDER — ADULT MULTIVITAMIN W/MINERALS CH: 1.0000 | ORAL_TABLET | Freq: Every day | ORAL | Status: DC

## 2022-08-02 MED ORDER — PREDNISONE 50 MG PO TABS: 250.0000 mg | ORAL_TABLET | ORAL | 0 refills | Status: DC

## 2022-08-02 MED ORDER — PREDNISONE 50 MG PO TABS
250.0000 mg | ORAL_TABLET | Freq: Once | ORAL | Status: AC
  Administered 2022-08-02: 250 mg via ORAL
  Filled 2022-08-02: qty 5

## 2022-08-02 MED ORDER — PREDNISONE 20 MG PO TABS: 50.0000 mg | ORAL_TABLET | Freq: Once | ORAL | Status: DC

## 2022-08-02 MED ORDER — METHYLPREDNISOLONE 4 MG PO TBPK: ORAL_TABLET | ORAL | 0 refills | Status: DC

## 2022-08-02 NOTE — TOC Progression Note (Signed)
Transition of Care [facility]) - Progression Note    Patient Details  Name: Lisa Williams MRN: [id number] Date of Birth: Dec 29, 1960  [facility] CM/SW Contact  Beverly Sessions, RN Phone Number: 08/02/2022, 8:34 AM  Clinical Narrative:     Insurance auth for SNF and EMS pending   Expected Discharge Plan: [place] Barriers to Discharge: Continued Medical Work up  Expected Discharge Plan and [place] Choice: [place] arrangements for the past 2 months: Single Family Home                                       Social Determinants of Health (SDOH) Interventions SDOH Screenings   Food Insecurity: No Food Insecurity (07/29/2022)  Housing: Low Risk  (07/29/2022)  Transportation Needs: No Transportation Needs (07/29/2022)  Utilities: Not At Risk (07/29/2022)  Tobacco Use: Medium Risk (07/29/2022)    Readmission Risk Interventions     No data to display

## 2022-08-02 NOTE — Discharge Summary (Addendum)
Physician Discharge Summary   Lisa Williams  female DOB: 07/20/1960  [id number]  PCP: Kirk Ruths, MD  Admit date: 07/29/2022 Discharge date: 08/02/2022  Admitted From: home Disposition:  SNF rehab CODE STATUS: Full code   Hospital Course:  For full details, please see H&P, progress notes, consult notes and ancillary notes.  Briefly,  [name] is a 62 year old female with history of multiple sclerosis, hypertension, neurogenic bladder, hidradenitis, who presented to emergency department for chief concerns of headache and leg weakness.   Multiple sclerosis exacerbation: --received IV solumedrol 1000mg  daily x 4 days.  A 5th dose was planned on the day of discharge, however, pt was a difficult stick and multiple attempts couldn't establish an IV, so Neuro cleared for pt to take oral prednisone 1250 mg for day 5, to be given in 50 mg x 5 tabs q4h for a total of 1250 mg.   --Pt received prednisone 250 mg just prior to discharge.  After pt arrives in SNF, she will be due 50 mg x5 tabs right away, and then q4h to finish the rest of 1000 mg of prednisone. And after that, pt will start Medrol dosepak taper on 08/03/22. --cont home siponimod & pt is taking her own home supply.  --Pt refused MRI as she was told by her outpatient neurologist that it was not needed currently as management of this exacerbation would not be changed by the results  --f/u with outpatient neuro Dr. Felecia Shelling 2 weeks after discharge.   Weakness of both lower extremities:  likely secondary to MS exacerbation  --SNF rehab   Hypokalemia:   --monitored and repleted PRN   Spasticity:  continue on home dose of tizanidine, gabapentin, baclofen    HTN:  --cont home telmisartan   Hereditary and idiopathic neuropathy:  continue on home dose of gabapentin    Obesity: BMI 36.6.  Would benefit from weight loss   Discharge Diagnoses:  Principal Problem:   Multiple sclerosis  exacerbation ([place]) Active Problems:   Hypercholesteremia   Hereditary and idiopathic neuropathy   Adiposity   Hypercholesterolemia without hypertriglyceridemia   Multiple sclerosis (HCC)   Chronic left shoulder pain   Essential hypertension   Spasticity   Neurogenic bladder   Weakness   Hypokalemia   Weakness of both lower extremities   30 Day Unplanned Readmission Risk Score    Flowsheet Row ED to Hosp-Admission (Current) from 07/29/2022 in [place]  30 Day Unplanned Readmission Risk Score (%) 12.87 Filed at 08/02/2022 1200       This score is the patient's risk of an unplanned readmission within 30 days of being discharged (0 -100%). The score is based on dignosis, age, lab data, medications, orders, and past utilization.   Low:  0-14.9   Medium: 15-21.9   High: 22-29.9   Extreme: 30 and above         Discharge Instructions:  Allergies as of 08/02/2022       Reactions   Amlodipine Swelling   Betadine [povidone-iodine]    Povidone Iodine Rash        Medication List     TAKE these medications    acetaminophen 500 MG tablet Commonly known as: TYLENOL Take 500-1,000 mg by mouth every 6 (six) hours as needed for moderate pain or headache.   baclofen 20 MG tablet Commonly known as: LIORESAL Take 1 tablet (20 mg total) by mouth 3 (three) times daily.   Cholecalciferol 25 MCG (1000  UT) tablet Take 5,000 Units by mouth daily.   gabapentin 100 MG capsule Commonly known as: NEURONTIN Take 1 capsule (100 mg total) by mouth 2 (two) times daily.   Mayzent 2 MG Tabs Generic drug: Siponimod Fumarate Take 2 mg by mouth daily.   methylPREDNISolone 4 MG Tbpk tablet Commonly known as: MEDROL DOSEPAK Take as directed Start taking on: August 03, 2022   multivitamin with minerals Tabs tablet Take 1 tablet by mouth daily. Start taking on: August 03, 2022   oxybutynin 10 MG 24 hr tablet Commonly known as: DITROPAN-XL Take 1  tablet (10 mg total) by mouth daily.   predniSONE 50 MG tablet Commonly known as: DELTASONE Take 5 tablets (250 mg total) by mouth every 4 (four) hours.   telmisartan 40 MG tablet Commonly known as: MICARDIS Take 40 mg by mouth daily.   tiZANidine 4 MG tablet Commonly known as: ZANAFLEX One po qAM, one po qPM and 2 po qHS         Contact information for follow-up providers     Sater, Nanine Means, MD Follow up in 2 week(s).   Specialty: Neurology Contact information: [place] [address] [phone]              Contact information for after-discharge care     Destination     [facility] .   Service: Skilled Nursing Contact information: [address] [place] [phone]                     Allergies  Allergen Reactions   Amlodipine Swelling   Betadine [Povidone-Iodine]    Povidone Iodine Rash     The results of significant diagnostics from this hospitalization (including imaging, microbiology, ancillary and laboratory) are listed below for reference.   Consultations:   Procedures/Studies: No results found.    Labs: BNP (last 3 results) No results for input(s): "BNP" in the last 8760 hours. Basic Metabolic Panel: Recent Labs  Lab 07/29/22 1609 07/29/22 1930 07/30/22 0501 07/31/22 0528 08/01/22 0657 08/02/22 0630  NA 140  --  139 139 138 140  K 3.2*  --  4.2 4.3 4.2 4.7  CL 111  --  109 107 109 108  CO2 22  --  21* 22 24 27   GLUCOSE 93  --  156* 150* 140* 139*  BUN 20  --  22* 29* 33* 33*  CREATININE 0.48  --  0.64 0.63 0.70 0.68  CALCIUM 8.5*  --  9.2 9.4 8.9 8.9  MG  --  2.3 2.1  --   --   --   PHOS  --  3.1  --   --   --   --    Liver Function Tests: Recent Labs  Lab 07/29/22 1609  AST 42*  ALT 70*  ALKPHOS 124  BILITOT 0.9  PROT 6.7  ALBUMIN 3.8   No results for input(s): "LIPASE", "AMYLASE" in the last 168 hours. No results for input(s):  "AMMONIA" in the last 168 hours. CBC: Recent Labs  Lab 07/29/22 1609 07/30/22 0501  WBC 7.6 6.4  NEUTROABS 6.3  --   HGB 13.0 13.4  HCT 37.3 38.3  MCV 76.7* 76.0*  PLT 242 250   Cardiac Enzymes: No results for input(s): "CKTOTAL", "CKMB", "CKMBINDEX", "TROPONINI" in the last 168 hours. BNP: Invalid input(s): "POCBNP" CBG: No results for input(s): "GLUCAP" in the last 168 hours. D-Dimer No results for input(s): "  DDIMER" in the last 72 hours. Hgb A1c No results for input(s): "HGBA1C" in the last 72 hours. Lipid Profile No results for input(s): "CHOL", "HDL", "LDLCALC", "TRIG", "CHOLHDL", "LDLDIRECT" in the last 72 hours. Thyroid function studies No results for input(s): "TSH", "T4TOTAL", "T3FREE", "THYROIDAB" in the last 72 hours.  Invalid input(s): "FREET3" Anemia work up No results for input(s): "VITAMINB12", "FOLATE", "FERRITIN", "TIBC", "IRON", "RETICCTPCT" in the last 72 hours. Urinalysis    Component Value Date/Time   COLORURINE YELLOW (A) 07/31/2022 0550   APPEARANCEUR CLOUDY (A) 07/31/2022 0550   APPEARANCEUR Clear 03/03/2021 1128   LABSPEC 1.032 (H) 07/31/2022 0550   LABSPEC 1.030 07/31/2011 1838   PHURINE 5.0 07/31/2022 0550   GLUCOSEU NEGATIVE 07/31/2022 0550   GLUCOSEU Negative 07/31/2011 1838   HGBUR NEGATIVE 07/31/2022 0550   BILIRUBINUR NEGATIVE 07/31/2022 0550   BILIRUBINUR Negative 03/03/2021 1128   BILIRUBINUR Negative 07/31/2011 1838   KETONESUR NEGATIVE 07/31/2022 0550   PROTEINUR NEGATIVE 07/31/2022 0550   NITRITE NEGATIVE 07/31/2022 0550   LEUKOCYTESUR NEGATIVE 07/31/2022 0550   LEUKOCYTESUR Negative 07/31/2011 1838   Sepsis Labs Recent Labs  Lab 07/29/22 1609 07/30/22 0501  WBC 7.6 6.4   Microbiology No results found for this or any previous visit (from the past 240 hour(s)).   Total time spend on discharging this patient, including the last patient exam, discussing the hospital stay, instructions for ongoing care as it relates to  all pertinent caregivers, as well as preparing the medical discharge records, prescriptions, and/or referrals as applicable, is 50 minutes.    Enzo Bi, MD  [facility] 08/02/2022, 2:06 PM

## 2022-08-02 NOTE — TOC Transition Note (Signed)
Transition of Care [facility]) - CM/SW Discharge Note   Patient Details  Name: Lisa Williams MRN: [id number] Date of Birth: 1961-01-28  Transition of [facility]) CM/SW Contact:  Candie Chroman, LCSW Phone Number: 08/02/2022, 5:00 PM   Clinical Narrative:  Patient has orders to discharge to [facility] SNF today. RN has already called report. EMS transport has been arranged. CSW spoke with Vivien Rota at the facility who confirmed they had the prednisone she needed when she arrives. No further concerns. CSW signing off.   Final next level of care: Skilled Nursing Facility Barriers to Discharge: Barriers Resolved   Patient Goals and CMS Choice      Discharge Placement     Existing PASRR number confirmed : 07/31/22          Patient chooses bed at: Other - please specify in the comment section below: ([facility]) Patient to be transferred to facility by: EMS   Patient and family notified of of transfer: 08/02/22  Discharge Plan and Services Additional resources added to the After Visit Summary for       Post Acute Care Choice: [place]                               Social Determinants of Health (SDOH) Interventions SDOH Screenings   Food Insecurity: No Food Insecurity (07/29/2022)  Housing: Low Risk  (07/29/2022)  Transportation Needs: No Transportation Needs (07/29/2022)  Utilities: Not At Risk (07/29/2022)  Tobacco Use: Medium Risk (07/29/2022)     Readmission Risk Interventions     No data to display

## 2022-08-02 NOTE — TOC Progression Note (Signed)
Transition of Care [facility]) - Progression Note    Patient Details  Name: Dayona Shott Poer MRN: [id number] Date of Birth: 1961-03-24  Transition of Care [facility]) CM/SW Contact  Beverly Sessions, RN Phone Number: 08/02/2022, 9:10 AM  Clinical Narrative:     Received call from [place] with HTA Auth approved for 7 days for SNF aut ID [id number]  Auth approved for [facility] auth ID [id number]  Expected Discharge Plan: [place] Barriers to Discharge: Continued Medical Work up  Expected Discharge Plan and [place] Choice: [place] arrangements for the past 2 months: Single Family Home                                       Social Determinants of Health (SDOH) Interventions SDOH Screenings   Food Insecurity: No Food Insecurity (07/29/2022)  Housing: Low Risk  (07/29/2022)  Transportation Needs: No Transportation Needs (07/29/2022)  Utilities: Not At Risk (07/29/2022)  Tobacco Use: Medium Risk (07/29/2022)    Readmission Risk Interventions     No data to display

## 2022-08-02 NOTE — Progress Notes (Signed)
PT Cancellation Note  Patient Details Name: Lisa Williams MRN: [id number] DOB: [birth year]   Cancelled Treatment:    PT attempt. Pt requested holding at this time." Im going to get a new IV then the last dose of antibiotics. MD told me that I'd be DC to rehab this afternoon and don't want to be too tired before I go."    Willette Pa 08/02/2022, 11:18 AM

## 2022-08-03 ENCOUNTER — Ambulatory Visit: Payer: PPO

## 2022-08-03 DIAGNOSIS — M62838 Other muscle spasm: Secondary | ICD-10-CM | POA: Diagnosis not present

## 2022-08-03 DIAGNOSIS — K592 Neurogenic bowel, not elsewhere classified: Secondary | ICD-10-CM | POA: Diagnosis not present

## 2022-08-03 DIAGNOSIS — M6281 Muscle weakness (generalized): Secondary | ICD-10-CM | POA: Diagnosis not present

## 2022-08-03 DIAGNOSIS — G35 Multiple sclerosis: Secondary | ICD-10-CM | POA: Diagnosis not present

## 2022-08-07 ENCOUNTER — Ambulatory Visit: Payer: PPO | Admitting: Neurology

## 2022-08-07 ENCOUNTER — Encounter: Payer: Self-pay | Admitting: Neurology

## 2022-08-08 ENCOUNTER — Ambulatory Visit: Payer: PPO

## 2022-08-10 ENCOUNTER — Ambulatory Visit: Payer: PPO

## 2022-08-15 ENCOUNTER — Ambulatory Visit: Payer: PPO

## 2022-08-15 DIAGNOSIS — R600 Localized edema: Secondary | ICD-10-CM | POA: Diagnosis not present

## 2022-08-17 ENCOUNTER — Ambulatory Visit: Payer: PPO

## 2022-08-18 DIAGNOSIS — H2513 Age-related nuclear cataract, bilateral: Secondary | ICD-10-CM | POA: Diagnosis not present

## 2022-08-18 DIAGNOSIS — G35 Multiple sclerosis: Secondary | ICD-10-CM | POA: Diagnosis not present

## 2022-08-22 ENCOUNTER — Ambulatory Visit: Payer: PPO

## 2022-08-24 ENCOUNTER — Ambulatory Visit: Payer: PPO

## 2022-08-29 ENCOUNTER — Ambulatory Visit: Payer: PPO

## 2022-08-31 ENCOUNTER — Ambulatory Visit: Payer: PPO

## 2022-09-05 ENCOUNTER — Ambulatory Visit: Payer: PPO

## 2022-09-07 ENCOUNTER — Ambulatory Visit: Payer: PPO

## 2022-09-07 DIAGNOSIS — E8809 Other disorders of plasma-protein metabolism, not elsewhere classified: Secondary | ICD-10-CM | POA: Diagnosis not present

## 2022-09-07 DIAGNOSIS — G609 Hereditary and idiopathic neuropathy, unspecified: Secondary | ICD-10-CM | POA: Diagnosis not present

## 2022-09-07 DIAGNOSIS — Z720 Tobacco use: Secondary | ICD-10-CM | POA: Diagnosis not present

## 2022-09-07 DIAGNOSIS — M792 Neuralgia and neuritis, unspecified: Secondary | ICD-10-CM | POA: Diagnosis not present

## 2022-09-07 DIAGNOSIS — Z993 Dependence on wheelchair: Secondary | ICD-10-CM | POA: Diagnosis not present

## 2022-09-07 DIAGNOSIS — E876 Hypokalemia: Secondary | ICD-10-CM | POA: Diagnosis not present

## 2022-09-07 DIAGNOSIS — M810 Age-related osteoporosis without current pathological fracture: Secondary | ICD-10-CM | POA: Diagnosis not present

## 2022-09-07 DIAGNOSIS — E785 Hyperlipidemia, unspecified: Secondary | ICD-10-CM | POA: Diagnosis not present

## 2022-09-07 DIAGNOSIS — Z8616 Personal history of COVID-19: Secondary | ICD-10-CM | POA: Diagnosis not present

## 2022-09-07 DIAGNOSIS — Z9181 History of falling: Secondary | ICD-10-CM | POA: Diagnosis not present

## 2022-09-07 DIAGNOSIS — Z6836 Body mass index (BMI) 36.0-36.9, adult: Secondary | ICD-10-CM | POA: Diagnosis not present

## 2022-09-07 DIAGNOSIS — E669 Obesity, unspecified: Secondary | ICD-10-CM | POA: Diagnosis not present

## 2022-09-07 DIAGNOSIS — M62838 Other muscle spasm: Secondary | ICD-10-CM | POA: Diagnosis not present

## 2022-09-07 DIAGNOSIS — K592 Neurogenic bowel, not elsewhere classified: Secondary | ICD-10-CM | POA: Diagnosis not present

## 2022-09-07 DIAGNOSIS — K5901 Slow transit constipation: Secondary | ICD-10-CM | POA: Diagnosis not present

## 2022-09-07 DIAGNOSIS — I10 Essential (primary) hypertension: Secondary | ICD-10-CM | POA: Diagnosis not present

## 2022-09-07 DIAGNOSIS — G35 Multiple sclerosis: Secondary | ICD-10-CM | POA: Diagnosis not present

## 2022-09-07 DIAGNOSIS — Z96651 Presence of right artificial knee joint: Secondary | ICD-10-CM | POA: Diagnosis not present

## 2022-09-07 DIAGNOSIS — E46 Unspecified protein-calorie malnutrition: Secondary | ICD-10-CM | POA: Diagnosis not present

## 2022-09-07 DIAGNOSIS — N319 Neuromuscular dysfunction of bladder, unspecified: Secondary | ICD-10-CM | POA: Diagnosis not present

## 2022-09-12 ENCOUNTER — Ambulatory Visit: Payer: PPO

## 2022-09-14 ENCOUNTER — Ambulatory Visit: Payer: PPO

## 2022-09-19 ENCOUNTER — Ambulatory Visit: Payer: PPO

## 2022-09-19 DIAGNOSIS — K592 Neurogenic bowel, not elsewhere classified: Secondary | ICD-10-CM | POA: Diagnosis not present

## 2022-09-19 DIAGNOSIS — M810 Age-related osteoporosis without current pathological fracture: Secondary | ICD-10-CM | POA: Diagnosis not present

## 2022-09-19 DIAGNOSIS — M792 Neuralgia and neuritis, unspecified: Secondary | ICD-10-CM | POA: Diagnosis not present

## 2022-09-19 DIAGNOSIS — G35 Multiple sclerosis: Secondary | ICD-10-CM | POA: Diagnosis not present

## 2022-09-19 DIAGNOSIS — M62838 Other muscle spasm: Secondary | ICD-10-CM | POA: Diagnosis not present

## 2022-09-19 DIAGNOSIS — I10 Essential (primary) hypertension: Secondary | ICD-10-CM | POA: Diagnosis not present

## 2022-09-19 DIAGNOSIS — E785 Hyperlipidemia, unspecified: Secondary | ICD-10-CM | POA: Diagnosis not present

## 2022-09-21 ENCOUNTER — Ambulatory Visit: Payer: PPO

## 2022-09-21 DIAGNOSIS — G35 Multiple sclerosis: Secondary | ICD-10-CM | POA: Diagnosis not present

## 2022-09-21 DIAGNOSIS — I10 Essential (primary) hypertension: Secondary | ICD-10-CM | POA: Diagnosis not present

## 2022-09-21 DIAGNOSIS — E785 Hyperlipidemia, unspecified: Secondary | ICD-10-CM | POA: Diagnosis not present

## 2022-09-21 DIAGNOSIS — M62838 Other muscle spasm: Secondary | ICD-10-CM | POA: Diagnosis not present

## 2022-09-21 DIAGNOSIS — E8809 Other disorders of plasma-protein metabolism, not elsewhere classified: Secondary | ICD-10-CM | POA: Diagnosis not present

## 2022-09-21 DIAGNOSIS — Z8616 Personal history of COVID-19: Secondary | ICD-10-CM | POA: Diagnosis not present

## 2022-09-21 DIAGNOSIS — E669 Obesity, unspecified: Secondary | ICD-10-CM | POA: Diagnosis not present

## 2022-09-21 DIAGNOSIS — K592 Neurogenic bowel, not elsewhere classified: Secondary | ICD-10-CM | POA: Diagnosis not present

## 2022-09-21 DIAGNOSIS — Z993 Dependence on wheelchair: Secondary | ICD-10-CM | POA: Diagnosis not present

## 2022-09-21 DIAGNOSIS — Z9181 History of falling: Secondary | ICD-10-CM | POA: Diagnosis not present

## 2022-09-21 DIAGNOSIS — M792 Neuralgia and neuritis, unspecified: Secondary | ICD-10-CM | POA: Diagnosis not present

## 2022-09-21 DIAGNOSIS — Z6836 Body mass index (BMI) 36.0-36.9, adult: Secondary | ICD-10-CM | POA: Diagnosis not present

## 2022-09-21 DIAGNOSIS — Z720 Tobacco use: Secondary | ICD-10-CM | POA: Diagnosis not present

## 2022-09-21 DIAGNOSIS — G609 Hereditary and idiopathic neuropathy, unspecified: Secondary | ICD-10-CM | POA: Diagnosis not present

## 2022-09-21 DIAGNOSIS — E876 Hypokalemia: Secondary | ICD-10-CM | POA: Diagnosis not present

## 2022-09-21 DIAGNOSIS — K5901 Slow transit constipation: Secondary | ICD-10-CM | POA: Diagnosis not present

## 2022-09-21 DIAGNOSIS — N319 Neuromuscular dysfunction of bladder, unspecified: Secondary | ICD-10-CM | POA: Diagnosis not present

## 2022-09-21 DIAGNOSIS — E46 Unspecified protein-calorie malnutrition: Secondary | ICD-10-CM | POA: Diagnosis not present

## 2022-09-21 DIAGNOSIS — M810 Age-related osteoporosis without current pathological fracture: Secondary | ICD-10-CM | POA: Diagnosis not present

## 2022-09-21 DIAGNOSIS — Z96651 Presence of right artificial knee joint: Secondary | ICD-10-CM | POA: Diagnosis not present

## 2022-09-22 ENCOUNTER — Emergency Department
Admission: EM | Admit: 2022-09-22 | Discharge: 2022-09-22 | Disposition: A | Discharge status: Home / Self Care | Payer: PPO | Attending: Emergency Medicine | Admitting: Emergency Medicine

## 2022-09-22 ENCOUNTER — Emergency Department: Payer: PPO

## 2022-09-22 ENCOUNTER — Encounter: Payer: Self-pay | Admitting: Emergency Medicine

## 2022-09-22 ENCOUNTER — Encounter (HOSPITAL_COMMUNITY): Payer: Self-pay

## 2022-09-22 ENCOUNTER — Other Ambulatory Visit: Payer: Self-pay

## 2022-09-22 DIAGNOSIS — Z741 Need for assistance with personal care: Secondary | ICD-10-CM | POA: Diagnosis not present

## 2022-09-22 DIAGNOSIS — S99911A Unspecified injury of right ankle, initial encounter: Secondary | ICD-10-CM | POA: Diagnosis not present

## 2022-09-22 DIAGNOSIS — K592 Neurogenic bowel, not elsewhere classified: Secondary | ICD-10-CM | POA: Diagnosis not present

## 2022-09-22 DIAGNOSIS — Z87891 Personal history of nicotine dependence: Secondary | ICD-10-CM | POA: Diagnosis not present

## 2022-09-22 DIAGNOSIS — M6259 Muscle wasting and atrophy, not elsewhere classified, multiple sites: Secondary | ICD-10-CM | POA: Diagnosis not present

## 2022-09-22 DIAGNOSIS — R2681 Unsteadiness on feet: Secondary | ICD-10-CM | POA: Diagnosis not present

## 2022-09-22 DIAGNOSIS — S82209D Unspecified fracture of shaft of unspecified tibia, subsequent encounter for closed fracture with routine healing: Secondary | ICD-10-CM | POA: Diagnosis not present

## 2022-09-22 DIAGNOSIS — I10 Essential (primary) hypertension: Secondary | ICD-10-CM | POA: Diagnosis not present

## 2022-09-22 DIAGNOSIS — M6281 Muscle weakness (generalized): Secondary | ICD-10-CM | POA: Diagnosis not present

## 2022-09-22 DIAGNOSIS — S82451A Displaced comminuted fracture of shaft of right fibula, initial encounter for closed fracture: Secondary | ICD-10-CM | POA: Diagnosis not present

## 2022-09-22 DIAGNOSIS — B432 Subcutaneous pheomycotic abscess and cyst: Secondary | ICD-10-CM | POA: Diagnosis not present

## 2022-09-22 DIAGNOSIS — S82831A Other fracture of upper and lower end of right fibula, initial encounter for closed fracture: Secondary | ICD-10-CM | POA: Diagnosis not present

## 2022-09-22 DIAGNOSIS — Y92009 Unspecified place in unspecified non-institutional (private) residence as the place of occurrence of the external cause: Secondary | ICD-10-CM | POA: Insufficient documentation

## 2022-09-22 DIAGNOSIS — E785 Hyperlipidemia, unspecified: Secondary | ICD-10-CM | POA: Diagnosis not present

## 2022-09-22 DIAGNOSIS — S82841A Displaced bimalleolar fracture of right lower leg, initial encounter for closed fracture: Secondary | ICD-10-CM | POA: Diagnosis not present

## 2022-09-22 DIAGNOSIS — K219 Gastro-esophageal reflux disease without esophagitis: Secondary | ICD-10-CM | POA: Diagnosis not present

## 2022-09-22 DIAGNOSIS — W19XXXA Unspecified fall, initial encounter: Secondary | ICD-10-CM | POA: Diagnosis not present

## 2022-09-22 DIAGNOSIS — Z883 Allergy status to other anti-infective agents status: Secondary | ICD-10-CM | POA: Diagnosis not present

## 2022-09-22 DIAGNOSIS — M62838 Other muscle spasm: Secondary | ICD-10-CM | POA: Diagnosis not present

## 2022-09-22 DIAGNOSIS — X58XXXA Exposure to other specified factors, initial encounter: Secondary | ICD-10-CM | POA: Diagnosis not present

## 2022-09-22 DIAGNOSIS — R41841 Cognitive communication deficit: Secondary | ICD-10-CM | POA: Diagnosis not present

## 2022-09-22 DIAGNOSIS — W050XXA Fall from non-moving wheelchair, initial encounter: Secondary | ICD-10-CM | POA: Diagnosis not present

## 2022-09-22 DIAGNOSIS — Z4789 Encounter for other orthopedic aftercare: Secondary | ICD-10-CM | POA: Diagnosis not present

## 2022-09-22 DIAGNOSIS — R279 Unspecified lack of coordination: Secondary | ICD-10-CM | POA: Diagnosis not present

## 2022-09-22 DIAGNOSIS — R1311 Dysphagia, oral phase: Secondary | ICD-10-CM | POA: Diagnosis not present

## 2022-09-22 DIAGNOSIS — Z7901 Long term (current) use of anticoagulants: Secondary | ICD-10-CM | POA: Diagnosis not present

## 2022-09-22 DIAGNOSIS — S82101A Unspecified fracture of upper end of right tibia, initial encounter for closed fracture: Secondary | ICD-10-CM | POA: Diagnosis not present

## 2022-09-22 DIAGNOSIS — G35 Multiple sclerosis: Secondary | ICD-10-CM | POA: Diagnosis not present

## 2022-09-22 DIAGNOSIS — Z993 Dependence on wheelchair: Secondary | ICD-10-CM | POA: Diagnosis not present

## 2022-09-22 DIAGNOSIS — G8918 Other acute postprocedural pain: Secondary | ICD-10-CM | POA: Diagnosis not present

## 2022-09-22 DIAGNOSIS — R609 Edema, unspecified: Secondary | ICD-10-CM | POA: Diagnosis not present

## 2022-09-22 DIAGNOSIS — R2689 Other abnormalities of gait and mobility: Secondary | ICD-10-CM | POA: Diagnosis not present

## 2022-09-22 DIAGNOSIS — Z7401 Bed confinement status: Secondary | ICD-10-CM | POA: Diagnosis not present

## 2022-09-22 DIAGNOSIS — S82251A Displaced comminuted fracture of shaft of right tibia, initial encounter for closed fracture: Secondary | ICD-10-CM | POA: Diagnosis not present

## 2022-09-22 DIAGNOSIS — M79604 Pain in right leg: Secondary | ICD-10-CM | POA: Diagnosis present

## 2022-09-22 DIAGNOSIS — Y93E1 Activity, personal bathing and showering: Secondary | ICD-10-CM | POA: Diagnosis not present

## 2022-09-22 DIAGNOSIS — Z888 Allergy status to other drugs, medicaments and biological substances status: Secondary | ICD-10-CM | POA: Diagnosis not present

## 2022-09-22 DIAGNOSIS — S82201A Unspecified fracture of shaft of right tibia, initial encounter for closed fracture: Secondary | ICD-10-CM | POA: Diagnosis not present

## 2022-09-22 DIAGNOSIS — S82209A Unspecified fracture of shaft of unspecified tibia, initial encounter for closed fracture: Secondary | ICD-10-CM | POA: Diagnosis not present

## 2022-09-22 LAB — CBC WITH DIFFERENTIAL/PLATELET
Abs Immature Granulocytes: 0.05 10*3/uL (ref 0.00–0.07)
Basophils Absolute: 0 10*3/uL (ref 0.0–0.1)
Basophils Relative: 0 %
Eosinophils Absolute: 0.1 10*3/uL (ref 0.0–0.5)
Eosinophils Relative: 1 %
HCT: 39.8 % (ref 36.0–46.0)
Hemoglobin: 13.7 g/dL (ref 12.0–15.0)
Immature Granulocytes: 0 %
Lymphocytes Relative: 3 %
Lymphs Abs: 0.4 10*3/uL — ABNORMAL LOW (ref 0.7–4.0)
MCH: 26.8 pg (ref 26.0–34.0)
MCHC: 34.4 g/dL (ref 30.0–36.0)
MCV: 77.9 fL — ABNORMAL LOW (ref 80.0–100.0)
Monocytes Absolute: 1.1 10*3/uL — ABNORMAL HIGH (ref 0.1–1.0)
Monocytes Relative: 9 %
Neutro Abs: 10.8 10*3/uL — ABNORMAL HIGH (ref 1.7–7.7)
Neutrophils Relative %: 87 %
Platelets: 232 10*3/uL (ref 150–400)
RBC: 5.11 MIL/uL (ref 3.87–5.11)
RDW: 15.8 % — ABNORMAL HIGH (ref 11.5–15.5)
WBC: 12.4 10*3/uL — ABNORMAL HIGH (ref 4.0–10.5)
nRBC: 0 % (ref 0.0–0.2)

## 2022-09-22 LAB — COMPREHENSIVE METABOLIC PANEL
ALT: 52 U/L — ABNORMAL HIGH (ref 0–44)
AST: 35 U/L (ref 15–41)
Albumin: 4.1 g/dL (ref 3.5–5.0)
Alkaline Phosphatase: 116 U/L (ref 38–126)
Anion gap: 9 (ref 5–15)
BUN: 28 mg/dL — ABNORMAL HIGH (ref 6–20)
CO2: 25 mmol/L (ref 22–32)
Calcium: 9.2 mg/dL (ref 8.9–10.3)
Chloride: 107 mmol/L (ref 98–111)
Creatinine, Ser: 0.62 mg/dL (ref 0.44–1.00)
GFR, Estimated: >60 mL/min (ref >60)
Glucose, Bld: 95 mg/dL (ref 70–99)
Potassium: 4.5 mmol/L (ref 3.5–5.1)
Sodium: 141 mmol/L (ref 135–145)
Total Bilirubin: 1.2 mg/dL (ref 0.3–1.2)
Total Protein: 7.2 g/dL (ref 6.5–8.1)

## 2022-09-22 MED ORDER — ONDANSETRON HCL 4 MG/2ML IJ SOLN
4.0000 mg | Freq: Once | INTRAMUSCULAR | Status: AC
  Administered 2022-09-22: 4 mg via INTRAVENOUS
  Filled 2022-09-22: qty 2

## 2022-09-22 MED ORDER — MORPHINE SULFATE (PF) 4 MG/ML IV SOLN
4.0000 mg | Freq: Once | INTRAVENOUS | Status: AC
  Administered 2022-09-22: 4 mg via INTRAVENOUS
  Filled 2022-09-22: qty 1

## 2022-09-22 NOTE — ED Notes (Signed)
[facility] arrived to transport patient. Patient stable at time of departure.

## 2022-09-22 NOTE — ED Notes (Signed)
Upon arrival to EMS bay, pt refusing to get into recliner chair for triage. Explained that there are no treatment rooms available at the time and that I can accommodate pt being in a stretcher but will still be going to triage.

## 2022-09-22 NOTE — ED Provider Notes (Signed)
-----------------------------------------   2:35 PM on 09/22/2022 -----------------------------------------  Patient with a history of MS and fall suffered significant proximal tibia fracture that is quite comminuted.  Patient being primarily treated by physician assistant Bridget Hartshorn, she is spoken to Dr. Hyacinth Meeker of orthopedics who believes the fracture is too complex and needs to be treated at a tertiary center.  Patient prefers to go to [facility].  Remainder of the patient's workup is reassuring, CBC is normal, chemistry is normal.  Patient remains well-appearing awaiting transfer acceptance.   Minna Antis, MD 09/22/22 1447

## 2022-09-22 NOTE — ED Triage Notes (Signed)
Pt here via [facility] with a fall. Pt fell 1 hour ago, hx of MS and htn. Pt was standing up from her wheelchair and said her knee gave out. Pt right leg from her knee down hurts, is swollen, and is rotated outward, splint applied by ems.  77 99% RA 169/90

## 2022-09-22 NOTE — ED Notes (Signed)
See triage note  Presents with injury to right leg  S/p fall   States her knee gave out and she fell  Deformity noted to right ankle area  good pulses

## 2022-09-22 NOTE — ED Provider Notes (Addendum)
[facility] Provider Note    Event Date/Time   First MD Initiated Contact with Patient 09/22/22 [phone]     (approximate)   History   Fall   HPI  Lisa Williams is a 62 y.o. female presents to the ED via EMS after a fall that occurred this morning at home.  Patient has history of MS and was in a wheelchair and was going to stand when she felt as if her knee was giving out.  Patient fell on the floor landing on her right leg and has continued to have pain since that time.  Patient denies any head injury or loss of consciousness.  She denies any other injuries.  Patient has history of MS, hypertension, SVT, neuropathy, neurogenic bladder.     Physical Exam   Triage Vital Signs: ED Triage Vitals  Enc Vitals Group     BP 09/22/22 0919 (!) 156/78     Pulse Rate 09/22/22 0919 65     Resp 09/22/22 0919 20     Temp 09/22/22 0919 (!) 97.5 F (36.4 C)     Temp Source 09/22/22 0919 Oral     SpO2 09/22/22 0919 100 %     Weight 09/22/22 0917 263 lb 0.1 oz (119.3 kg)     Height 09/22/22 0917 5\' 11"  (1.803 m)     Head Circumference --      Peak Flow --      Pain Score 09/22/22 0916 9     Pain Loc --      Pain Edu? --      Excl. in GC? --     Most recent vital signs: Vitals:   09/22/22 0919 09/22/22 1312  BP: (!) 156/78 (!) 151/68  Pulse: 65 76  Resp: 20 18  Temp: (!) 97.5 F (36.4 C)   SpO2: 100% 98%     General: Awake, no distress.  Alert, talkative, cooperative. CV:  Good peripheral perfusion.  Heart regular rate and rhythm. Resp:  Normal effort.  Clear bilaterally. Abd:  No distention.  Soft, nontender.  Sounds present x 4 quadrants. Other:  Right lower extremity exam no point tenderness on palpation or compression of the hips bilaterally.  No point tenderness on palpation of the patella however inferiorly to the patella there is moderate amount of soft tissue edema and markedly tender to palpation.  Skin is intact.  No tenderness noted on  palpation of the ankle or foot area.  Motor sensory function intact distal to the injury, DP pulse present.   ED Results / Procedures / Treatments   Labs (all labs ordered are listed, but only abnormal results are displayed) Labs Reviewed  CBC WITH DIFFERENTIAL/PLATELET - Abnormal; Notable for the following components:      Result Value   WBC 12.4 (*)    MCV 77.9 (*)    RDW 15.8 (*)    Neutro Abs 10.8 (*)    Lymphs Abs 0.4 (*)    Monocytes Absolute 1.1 (*)    All other components within normal limits  COMPREHENSIVE METABOLIC PANEL - Abnormal; Notable for the following components:   BUN 28 (*)    ALT 52 (*)    All other components within normal limits     EKG     RADIOLOGY X-ray images of the right tib-fib were reviewed and interpreted by myself independent of the radiologist and a comminuted fracture of the proximal tibia is noted.  Possible small fracture of the proximal  fibula. Right ankle x-ray images were reviewed and interpreted by myself independent of the radiologist and no fracture or dislocation was noted. X-ray images of the right hip were reviewed and interpreted by myself independent of the radiologist and no fracture or dislocation noted.    PROCEDURES:  Critical Care performed:   Procedures   MEDICATIONS ORDERED IN ED: Medications  morphine (PF) 4 MG/ML injection 4 mg (4 mg Intravenous Given 09/22/22 1052)  ondansetron (ZOFRAN) injection 4 mg (4 mg Intravenous Given 09/22/22 1052)  morphine (PF) 4 MG/ML injection 4 mg (4 mg Intravenous Given 09/22/22 1254)     IMPRESSION / MDM / ASSESSMENT AND PLAN / ED COURSE  I reviewed the triage vital signs and the nursing notes.   Differential diagnosis includes, but is not limited to, fractured right hip, fracture knee, contusion, dislocation, fracture right ankle, contusion, sprain secondary to fall.  ----------------------------------------- 2:44 PM on  09/22/2022 ----------------------------------------- I spoke with Dr.Brian Durene Cal, on-call for [facility] about this patient.  Was advised to transfer from ED to ED.  Patient and family was made aware.  Vital signs remained stable.  Dr. Scotty Court was made aware and is filling out EMTALA form.  Arrangements are being made for transfer.   62 year old female presents to the ED via EMS after a fall that occurred when she stood from her wheelchair feeling that her knee gave out and onto the floor.  She denies any other injuries.  X-ray showed a comminuted fracture of the proximal tibia.  X-rays of the right hip and ankle were also obtained and were negative for fracture or dislocation.  Patient received morphine 4 mg IV with relief of her pain and right lower extremity was splinted.  I spoke with Dr. Hyacinth Meeker who is on-call for [facility] who advised me that she would need to be transferred as he did not feel comfortable doing the surgery as it would be complicated.      Patient's presentation is most consistent with acute illness / injury with system symptoms.  FINAL CLINICAL IMPRESSION(S) / ED DIAGNOSES   Final diagnoses:  Displaced comminuted fracture of shaft of right tibia, initial encounter for closed fracture     Rx / DC Orders   ED Discharge Orders     None        Note:  This document was prepared using Dragon voice recognition software and may include unintentional dictation errors.   Tommi Rumps, PA-C 09/22/22 1449    Tommi Rumps, PA-C 09/22/22 1518    Sharman Cheek, MD 09/22/22 [phone]

## 2022-09-22 NOTE — ED Notes (Signed)
Knee immobilizer applied to right knee per order.

## 2022-09-26 ENCOUNTER — Ambulatory Visit: Payer: PPO

## 2022-09-27 DIAGNOSIS — S82191A Other fracture of upper end of right tibia, initial encounter for closed fracture: Secondary | ICD-10-CM | POA: Diagnosis not present

## 2022-09-27 DIAGNOSIS — W1839XD Other fall on same level, subsequent encounter: Secondary | ICD-10-CM | POA: Diagnosis not present

## 2022-09-27 DIAGNOSIS — M810 Age-related osteoporosis without current pathological fracture: Secondary | ICD-10-CM | POA: Diagnosis not present

## 2022-09-27 DIAGNOSIS — Z7901 Long term (current) use of anticoagulants: Secondary | ICD-10-CM | POA: Diagnosis not present

## 2022-09-27 DIAGNOSIS — R41841 Cognitive communication deficit: Secondary | ICD-10-CM | POA: Diagnosis not present

## 2022-09-27 DIAGNOSIS — Z741 Need for assistance with personal care: Secondary | ICD-10-CM | POA: Diagnosis not present

## 2022-09-27 DIAGNOSIS — Z7401 Bed confinement status: Secondary | ICD-10-CM | POA: Diagnosis not present

## 2022-09-27 DIAGNOSIS — S82209A Unspecified fracture of shaft of unspecified tibia, initial encounter for closed fracture: Secondary | ICD-10-CM | POA: Diagnosis not present

## 2022-09-27 DIAGNOSIS — B432 Subcutaneous pheomycotic abscess and cyst: Secondary | ICD-10-CM | POA: Diagnosis not present

## 2022-09-27 DIAGNOSIS — S82191D Other fracture of upper end of right tibia, subsequent encounter for closed fracture with routine healing: Secondary | ICD-10-CM | POA: Diagnosis not present

## 2022-09-27 DIAGNOSIS — M6281 Muscle weakness (generalized): Secondary | ICD-10-CM | POA: Diagnosis not present

## 2022-09-27 DIAGNOSIS — K219 Gastro-esophageal reflux disease without esophagitis: Secondary | ICD-10-CM | POA: Diagnosis not present

## 2022-09-27 DIAGNOSIS — S82209D Unspecified fracture of shaft of unspecified tibia, subsequent encounter for closed fracture with routine healing: Secondary | ICD-10-CM | POA: Diagnosis not present

## 2022-09-27 DIAGNOSIS — M25861 Other specified joint disorders, right knee: Secondary | ICD-10-CM | POA: Diagnosis not present

## 2022-09-27 DIAGNOSIS — E559 Vitamin D deficiency, unspecified: Secondary | ICD-10-CM | POA: Diagnosis not present

## 2022-09-27 DIAGNOSIS — M62838 Other muscle spasm: Secondary | ICD-10-CM | POA: Diagnosis not present

## 2022-09-27 DIAGNOSIS — R279 Unspecified lack of coordination: Secondary | ICD-10-CM | POA: Diagnosis not present

## 2022-09-27 DIAGNOSIS — M792 Neuralgia and neuritis, unspecified: Secondary | ICD-10-CM | POA: Diagnosis not present

## 2022-09-27 DIAGNOSIS — M6259 Muscle wasting and atrophy, not elsewhere classified, multiple sites: Secondary | ICD-10-CM | POA: Diagnosis not present

## 2022-09-27 DIAGNOSIS — N319 Neuromuscular dysfunction of bladder, unspecified: Secondary | ICD-10-CM | POA: Diagnosis not present

## 2022-09-27 DIAGNOSIS — R1311 Dysphagia, oral phase: Secondary | ICD-10-CM | POA: Diagnosis not present

## 2022-09-27 DIAGNOSIS — I10 Essential (primary) hypertension: Secondary | ICD-10-CM | POA: Diagnosis not present

## 2022-09-27 DIAGNOSIS — G35 Multiple sclerosis: Secondary | ICD-10-CM | POA: Diagnosis not present

## 2022-09-27 DIAGNOSIS — Z4789 Encounter for other orthopedic aftercare: Secondary | ICD-10-CM | POA: Diagnosis not present

## 2022-09-27 DIAGNOSIS — S82201D Unspecified fracture of shaft of right tibia, subsequent encounter for closed fracture with routine healing: Secondary | ICD-10-CM | POA: Diagnosis not present

## 2022-09-27 DIAGNOSIS — R262 Difficulty in walking, not elsewhere classified: Secondary | ICD-10-CM | POA: Diagnosis not present

## 2022-09-27 DIAGNOSIS — Z87891 Personal history of nicotine dependence: Secondary | ICD-10-CM | POA: Diagnosis not present

## 2022-09-27 DIAGNOSIS — K592 Neurogenic bowel, not elsewhere classified: Secondary | ICD-10-CM | POA: Diagnosis not present

## 2022-09-27 DIAGNOSIS — S82841D Displaced bimalleolar fracture of right lower leg, subsequent encounter for closed fracture with routine healing: Secondary | ICD-10-CM | POA: Diagnosis not present

## 2022-09-27 DIAGNOSIS — K5901 Slow transit constipation: Secondary | ICD-10-CM | POA: Diagnosis not present

## 2022-09-27 DIAGNOSIS — R2689 Other abnormalities of gait and mobility: Secondary | ICD-10-CM | POA: Diagnosis not present

## 2022-09-27 DIAGNOSIS — R2681 Unsteadiness on feet: Secondary | ICD-10-CM | POA: Diagnosis not present

## 2022-09-28 ENCOUNTER — Ambulatory Visit: Payer: PPO

## 2022-09-28 DIAGNOSIS — M792 Neuralgia and neuritis, unspecified: Secondary | ICD-10-CM | POA: Diagnosis not present

## 2022-09-28 DIAGNOSIS — G35 Multiple sclerosis: Secondary | ICD-10-CM | POA: Diagnosis not present

## 2022-09-28 DIAGNOSIS — I10 Essential (primary) hypertension: Secondary | ICD-10-CM | POA: Diagnosis not present

## 2022-09-28 DIAGNOSIS — S82201D Unspecified fracture of shaft of right tibia, subsequent encounter for closed fracture with routine healing: Secondary | ICD-10-CM | POA: Diagnosis not present

## 2022-10-03 ENCOUNTER — Ambulatory Visit: Payer: PPO

## 2022-10-05 ENCOUNTER — Ambulatory Visit: Payer: PPO

## 2022-10-10 ENCOUNTER — Ambulatory Visit: Payer: PPO

## 2022-10-12 ENCOUNTER — Ambulatory Visit: Payer: PPO

## 2022-10-13 DIAGNOSIS — S82191A Other fracture of upper end of right tibia, initial encounter for closed fracture: Secondary | ICD-10-CM | POA: Diagnosis not present

## 2022-10-13 DIAGNOSIS — Z87891 Personal history of nicotine dependence: Secondary | ICD-10-CM | POA: Diagnosis not present

## 2022-10-13 DIAGNOSIS — S82841D Displaced bimalleolar fracture of right lower leg, subsequent encounter for closed fracture with routine healing: Secondary | ICD-10-CM | POA: Diagnosis not present

## 2022-10-13 DIAGNOSIS — W1839XD Other fall on same level, subsequent encounter: Secondary | ICD-10-CM | POA: Diagnosis not present

## 2022-10-17 ENCOUNTER — Ambulatory Visit: Payer: PPO

## 2022-10-19 ENCOUNTER — Ambulatory Visit: Payer: PPO

## 2022-10-24 ENCOUNTER — Ambulatory Visit: Payer: PPO

## 2022-10-26 ENCOUNTER — Ambulatory Visit: Payer: PPO

## 2022-10-31 ENCOUNTER — Ambulatory Visit: Payer: PPO

## 2022-11-02 ENCOUNTER — Ambulatory Visit: Payer: PPO

## 2022-11-03 DIAGNOSIS — N319 Neuromuscular dysfunction of bladder, unspecified: Secondary | ICD-10-CM | POA: Diagnosis not present

## 2022-11-03 DIAGNOSIS — I10 Essential (primary) hypertension: Secondary | ICD-10-CM | POA: Diagnosis not present

## 2022-11-03 DIAGNOSIS — G35 Multiple sclerosis: Secondary | ICD-10-CM | POA: Diagnosis not present

## 2022-11-03 DIAGNOSIS — M792 Neuralgia and neuritis, unspecified: Secondary | ICD-10-CM | POA: Diagnosis not present

## 2022-11-07 ENCOUNTER — Ambulatory Visit: Payer: PPO

## 2022-11-07 DIAGNOSIS — G35 Multiple sclerosis: Secondary | ICD-10-CM | POA: Diagnosis not present

## 2022-11-07 DIAGNOSIS — S82201D Unspecified fracture of shaft of right tibia, subsequent encounter for closed fracture with routine healing: Secondary | ICD-10-CM | POA: Diagnosis not present

## 2022-11-07 DIAGNOSIS — E559 Vitamin D deficiency, unspecified: Secondary | ICD-10-CM | POA: Diagnosis not present

## 2022-11-07 DIAGNOSIS — K5901 Slow transit constipation: Secondary | ICD-10-CM | POA: Diagnosis not present

## 2022-11-14 ENCOUNTER — Ambulatory Visit: Payer: PPO

## 2022-11-15 DIAGNOSIS — Z87891 Personal history of nicotine dependence: Secondary | ICD-10-CM | POA: Diagnosis not present

## 2022-11-15 DIAGNOSIS — W1839XD Other fall on same level, subsequent encounter: Secondary | ICD-10-CM | POA: Diagnosis not present

## 2022-11-15 DIAGNOSIS — M25861 Other specified joint disorders, right knee: Secondary | ICD-10-CM | POA: Diagnosis not present

## 2022-11-15 DIAGNOSIS — S82191D Other fracture of upper end of right tibia, subsequent encounter for closed fracture with routine healing: Secondary | ICD-10-CM | POA: Diagnosis not present

## 2022-11-16 ENCOUNTER — Ambulatory Visit: Payer: PPO

## 2022-11-21 ENCOUNTER — Ambulatory Visit: Payer: PPO

## 2022-11-23 ENCOUNTER — Ambulatory Visit: Payer: PPO

## 2022-11-27 DIAGNOSIS — G35 Multiple sclerosis: Secondary | ICD-10-CM | POA: Diagnosis not present

## 2022-11-27 DIAGNOSIS — N319 Neuromuscular dysfunction of bladder, unspecified: Secondary | ICD-10-CM | POA: Diagnosis not present

## 2022-11-27 DIAGNOSIS — M62838 Other muscle spasm: Secondary | ICD-10-CM | POA: Diagnosis not present

## 2022-11-27 DIAGNOSIS — I10 Essential (primary) hypertension: Secondary | ICD-10-CM | POA: Diagnosis not present

## 2022-11-28 ENCOUNTER — Ambulatory Visit: Payer: PPO

## 2022-12-05 ENCOUNTER — Ambulatory Visit: Payer: PPO

## 2022-12-05 DIAGNOSIS — Z8616 Personal history of COVID-19: Secondary | ICD-10-CM | POA: Diagnosis not present

## 2022-12-05 DIAGNOSIS — N643 Galactorrhea not associated with childbirth: Secondary | ICD-10-CM | POA: Diagnosis not present

## 2022-12-05 DIAGNOSIS — N319 Neuromuscular dysfunction of bladder, unspecified: Secondary | ICD-10-CM | POA: Diagnosis not present

## 2022-12-05 DIAGNOSIS — Z9181 History of falling: Secondary | ICD-10-CM | POA: Diagnosis not present

## 2022-12-05 DIAGNOSIS — Z993 Dependence on wheelchair: Secondary | ICD-10-CM | POA: Diagnosis not present

## 2022-12-05 DIAGNOSIS — Z87891 Personal history of nicotine dependence: Secondary | ICD-10-CM | POA: Diagnosis not present

## 2022-12-05 DIAGNOSIS — M80061D Age-related osteoporosis with current pathological fracture, right lower leg, subsequent encounter for fracture with routine healing: Secondary | ICD-10-CM | POA: Diagnosis not present

## 2022-12-05 DIAGNOSIS — E8809 Other disorders of plasma-protein metabolism, not elsewhere classified: Secondary | ICD-10-CM | POA: Diagnosis not present

## 2022-12-05 DIAGNOSIS — I10 Essential (primary) hypertension: Secondary | ICD-10-CM | POA: Diagnosis not present

## 2022-12-05 DIAGNOSIS — K5901 Slow transit constipation: Secondary | ICD-10-CM | POA: Diagnosis not present

## 2022-12-05 DIAGNOSIS — Z6836 Body mass index (BMI) 36.0-36.9, adult: Secondary | ICD-10-CM | POA: Diagnosis not present

## 2022-12-05 DIAGNOSIS — Z556 Problems related to health literacy: Secondary | ICD-10-CM | POA: Diagnosis not present

## 2022-12-05 DIAGNOSIS — G609 Hereditary and idiopathic neuropathy, unspecified: Secondary | ICD-10-CM | POA: Diagnosis not present

## 2022-12-05 DIAGNOSIS — E785 Hyperlipidemia, unspecified: Secondary | ICD-10-CM | POA: Diagnosis not present

## 2022-12-05 DIAGNOSIS — E669 Obesity, unspecified: Secondary | ICD-10-CM | POA: Diagnosis not present

## 2022-12-05 DIAGNOSIS — K592 Neurogenic bowel, not elsewhere classified: Secondary | ICD-10-CM | POA: Diagnosis not present

## 2022-12-05 DIAGNOSIS — G35 Multiple sclerosis: Secondary | ICD-10-CM | POA: Diagnosis not present

## 2022-12-05 DIAGNOSIS — E46 Unspecified protein-calorie malnutrition: Secondary | ICD-10-CM | POA: Diagnosis not present

## 2022-12-05 DIAGNOSIS — N39498 Other specified urinary incontinence: Secondary | ICD-10-CM | POA: Diagnosis not present

## 2022-12-12 ENCOUNTER — Ambulatory Visit: Payer: PPO

## 2022-12-19 ENCOUNTER — Ambulatory Visit: Payer: PPO

## 2022-12-19 DIAGNOSIS — I10 Essential (primary) hypertension: Secondary | ICD-10-CM | POA: Diagnosis not present

## 2022-12-19 DIAGNOSIS — E8809 Other disorders of plasma-protein metabolism, not elsewhere classified: Secondary | ICD-10-CM | POA: Diagnosis not present

## 2022-12-19 DIAGNOSIS — M80061D Age-related osteoporosis with current pathological fracture, right lower leg, subsequent encounter for fracture with routine healing: Secondary | ICD-10-CM | POA: Diagnosis not present

## 2022-12-19 DIAGNOSIS — E46 Unspecified protein-calorie malnutrition: Secondary | ICD-10-CM | POA: Diagnosis not present

## 2022-12-19 DIAGNOSIS — G35 Multiple sclerosis: Secondary | ICD-10-CM | POA: Diagnosis not present

## 2022-12-19 DIAGNOSIS — Z8616 Personal history of COVID-19: Secondary | ICD-10-CM | POA: Diagnosis not present

## 2022-12-19 DIAGNOSIS — K5901 Slow transit constipation: Secondary | ICD-10-CM | POA: Diagnosis not present

## 2022-12-20 DIAGNOSIS — M25361 Other instability, right knee: Secondary | ICD-10-CM | POA: Diagnosis not present

## 2022-12-26 ENCOUNTER — Ambulatory Visit: Payer: PPO

## 2022-12-26 DIAGNOSIS — Z0289 Encounter for other administrative examinations: Secondary | ICD-10-CM

## 2022-12-28 DIAGNOSIS — G35 Multiple sclerosis: Secondary | ICD-10-CM | POA: Diagnosis not present

## 2022-12-28 DIAGNOSIS — M810 Age-related osteoporosis without current pathological fracture: Secondary | ICD-10-CM | POA: Diagnosis not present

## 2022-12-28 DIAGNOSIS — M6281 Muscle weakness (generalized): Secondary | ICD-10-CM | POA: Diagnosis not present

## 2022-12-28 DIAGNOSIS — R262 Difficulty in walking, not elsewhere classified: Secondary | ICD-10-CM | POA: Diagnosis not present

## 2023-01-01 ENCOUNTER — Telehealth: Payer: Self-pay

## 2023-01-01 NOTE — Telephone Encounter (Signed)
Patient requesting DMV paperwork be completed in order for patient to drive. Are you agreeable to filling out?

## 2023-01-02 ENCOUNTER — Ambulatory Visit: Payer: PPO

## 2023-01-02 NOTE — Telephone Encounter (Signed)
Completed paperwork and in pod box for Dr. Epimenio Foot to review and sign

## 2023-01-02 NOTE — Telephone Encounter (Signed)
Called and left detailed message on cell voicemail per DPR access.

## 2023-01-02 NOTE — Telephone Encounter (Addendum)
Patient called back stating she is still using hand controls with no problems. Pt said form was sent via email to [facility] in medical records.   Form in currently in our Pod 1 inbox.

## 2023-01-05 ENCOUNTER — Encounter: Payer: Self-pay | Admitting: Neurology

## 2023-01-09 ENCOUNTER — Ambulatory Visit: Payer: PPO

## 2023-01-11 DIAGNOSIS — R7309 Other abnormal glucose: Secondary | ICD-10-CM | POA: Diagnosis not present

## 2023-01-11 DIAGNOSIS — Z0000 Encounter for general adult medical examination without abnormal findings: Secondary | ICD-10-CM | POA: Diagnosis not present

## 2023-01-11 DIAGNOSIS — I10 Essential (primary) hypertension: Secondary | ICD-10-CM | POA: Diagnosis not present

## 2023-01-15 ENCOUNTER — Telehealth: Payer: Self-pay | Admitting: *Deleted

## 2023-01-15 NOTE — Telephone Encounter (Signed)
Pt dmv form faxed on 12/23/2022. Please call dmv office to see if they have received the form.

## 2023-01-16 ENCOUNTER — Ambulatory Visit: Payer: PPO

## 2023-01-18 DIAGNOSIS — I700 Atherosclerosis of aorta: Secondary | ICD-10-CM | POA: Insufficient documentation

## 2023-01-18 DIAGNOSIS — I10 Essential (primary) hypertension: Secondary | ICD-10-CM | POA: Diagnosis not present

## 2023-01-18 DIAGNOSIS — G35 Multiple sclerosis: Secondary | ICD-10-CM | POA: Diagnosis not present

## 2023-01-18 DIAGNOSIS — R7303 Prediabetes: Secondary | ICD-10-CM | POA: Insufficient documentation

## 2023-01-18 DIAGNOSIS — Z2821 Immunization not carried out because of patient refusal: Secondary | ICD-10-CM | POA: Diagnosis not present

## 2023-01-18 DIAGNOSIS — M8080XD Other osteoporosis with current pathological fracture, unspecified site, subsequent encounter for fracture with routine healing: Secondary | ICD-10-CM | POA: Diagnosis not present

## 2023-01-19 DIAGNOSIS — Z87891 Personal history of nicotine dependence: Secondary | ICD-10-CM | POA: Diagnosis not present

## 2023-01-19 DIAGNOSIS — W050XXD Fall from non-moving wheelchair, subsequent encounter: Secondary | ICD-10-CM | POA: Diagnosis not present

## 2023-01-19 DIAGNOSIS — S82191D Other fracture of upper end of right tibia, subsequent encounter for closed fracture with routine healing: Secondary | ICD-10-CM | POA: Diagnosis not present

## 2023-01-19 DIAGNOSIS — M19071 Primary osteoarthritis, right ankle and foot: Secondary | ICD-10-CM | POA: Diagnosis not present

## 2023-01-19 DIAGNOSIS — W1839XD Other fall on same level, subsequent encounter: Secondary | ICD-10-CM | POA: Diagnosis not present

## 2023-01-23 ENCOUNTER — Ambulatory Visit: Payer: PPO

## 2023-01-28 DIAGNOSIS — R262 Difficulty in walking, not elsewhere classified: Secondary | ICD-10-CM | POA: Diagnosis not present

## 2023-01-28 DIAGNOSIS — M6281 Muscle weakness (generalized): Secondary | ICD-10-CM | POA: Diagnosis not present

## 2023-01-28 DIAGNOSIS — M810 Age-related osteoporosis without current pathological fracture: Secondary | ICD-10-CM | POA: Diagnosis not present

## 2023-01-28 DIAGNOSIS — G35 Multiple sclerosis: Secondary | ICD-10-CM | POA: Diagnosis not present

## 2023-01-30 ENCOUNTER — Ambulatory Visit: Payer: PPO

## 2023-02-05 NOTE — Therapy (Signed)
OUTPATIENT PHYSICAL THERAPY NEURO EVALUATION   Patient Name: Lisa Williams MRN: [id number] DOB:03/01/1961, 62 y.o., female Today's Date: 02/05/2023   PCP: Lauro Regulus MD REFERRING PROVIDER: Lauro Regulus MD  END OF SESSION:   Past Medical History:  Diagnosis Date   Abdominal pain, right upper quadrant    Back pain    Calculus of kidney 12/09/2013   Chronic back pain    unspecified   Chronic left shoulder pain 07/19/2015   Complication of anesthesia    Functional disorder of bladder    other   Galactorrhea 11/26/2014   Chronic    Hereditary and idiopathic neuropathy 08/19/2013   History of kidney stones    HPV test positive    Hypercholesteremia 08/19/2013   Hypertension    Incomplete bladder emptying    Microscopic hematuria    MS (multiple sclerosis) (HCC)    Muscle spasticity 05/21/2014   Nonspecific findings on examination of urine    other   Osteopenia    PONV (postoperative nausea and vomiting)    Status post laparoscopic supracervical hysterectomy 11/26/2014   Tobacco user 11/26/2014   Wrist fracture    Past Surgical History:  Procedure Laterality Date   bilateral tubal ligation  1996   BREAST CYST EXCISION Left 2002   CYST EXCISION Left 05/10/2022   Procedure: CYST REMOVAL;  Surgeon: Carolan Shiver, MD;  Location: [facility] ORS;  Service: General;  Laterality: Left;   FRACTURE SURGERY     KNEE SURGERY     right   LAPAROSCOPIC SUPRACERVICAL HYSTERECTOMY  08/05/2013   ORIF WRIST FRACTURE Left 01/17/2017   Procedure: OPEN REDUCTION INTERNAL FIXATION (ORIF) WRIST FRACTURE;  Surgeon: Lyndle Herrlich, MD;  Location: [facility] ORS;  Service: Orthopedics;  Laterality: Left;   RADIOLOGY WITH ANESTHESIA N/A 03/18/2020   Procedure: MRI WITH ANESTHESIA CERVICAL SPINE AND BRAIN  WITH AND WITHOUT CONTRAST;  Surgeon: Radiologist, Medication, MD;  Location: [facility] OR;  Service: Radiology;  Laterality: N/A;   TUBAL LIGATION Bilateral    VAGINAL  HYSTERECTOMY  03/2006   Patient Active Problem List   Diagnosis Date Noted   Hypokalemia 07/29/2022   Weakness of both lower extremities 07/29/2022   Abscess of left groin 11/15/2021   Abnormal LFTs 11/15/2021   Acute respiratory disease due to COVID-19 virus 11/21/2020   Weakness    Hypoalbuminemia due to protein-calorie malnutrition (HCC)    Neurogenic bowel    Neurogenic bladder    Labile blood pressure    Neuropathic pain    Abscess of female pelvis    SVT (supraventricular tachycardia)    Radial styloid tenosynovitis 03/12/2018   Wheelchair confinement 02/27/2018   Localized osteoporosis with current pathological fracture with routine healing 01/19/2017   Wrist fracture 01/16/2017   Sprain of ankle 03/23/2016   Closed fracture of lateral malleolus 03/16/2016   Health care maintenance 01/24/2016   Blood pressure elevated without history of HTN 10/25/2015   Essential hypertension 10/25/2015   Multiple sclerosis (HCC) 10/02/2015   Chronic left shoulder pain 07/19/2015   Multiple sclerosis exacerbation (HCC) 07/14/2015   MS (multiple sclerosis) (HCC) 11/26/2014   Increased body mass index 11/26/2014   HPV test positive 11/26/2014   Status post laparoscopic supracervical hysterectomy 11/26/2014   Galactorrhea 11/26/2014   Back ache 05/21/2014   Adiposity 05/21/2014   Disordered sleep 05/21/2014   Muscle spasticity 05/21/2014   Spasticity 05/21/2014   Calculus of kidney 12/09/2013   Renal colic 12/09/2013   Hypercholesteremia 08/19/2013   Hereditary  and idiopathic neuropathy 08/19/2013   Hypercholesterolemia without hypertriglyceridemia 08/19/2013   Bladder infection, chronic 07/25/2012   Disorder of bladder function 07/25/2012   Incomplete bladder emptying 07/25/2012   Microscopic hematuria 07/25/2012   Right upper quadrant pain 07/25/2012    ONSET DATE: 1995  REFERRING DIAG: MS, weakness   THERAPY DIAG:  No diagnosis found.  Rationale for Evaluation and  Treatment: Rehabilitation  SUBJECTIVE:                                                                                                                                                                                             SUBJECTIVE STATEMENT: Patient presents to PT s/p ORIF of R tibia shaft fracture 09/23/22 after her knee gave out and she fell.  Pt accompanied by: {accompnied:27141}  PERTINENT HISTORY: Patient is returning to PT s/p ORIF of R tibia shaft fracture 09/23/2022. Patient has weakness in BLE with RLE>LLE. She drives with hand controls. Patient has been diagnosed with MS in 1995. PMH includes: back pain, CBP, chronic L shoulder pain, galactorrhea, neuropathy, HPV, hypercholestermeia, HTN, MS, osteopenia, PONV, wrist fracture.   PAIN:  Are you having pain? {OPRCPAIN:27236}  PRECAUTIONS: Fall  RED FLAGS: {PT Red Flags:29287}   WEIGHT BEARING RESTRICTIONS: {Yes ***/No:24003}  FALLS: Has patient fallen in last 6 months? Yes. Number of falls ***  LIVING ENVIRONMENT: Lives with: lives with their family Lives in: House/apartment Stairs:  ramp from garage into house Has following equipment at home: Dan Humphreys - 2 wheeled, shower chair, Grab bars, and Ramped entry  PLOF: Independent with household mobility with device  PATIENT GOALS: ***  OBJECTIVE:  Note: Objective measures were completed at Evaluation unless otherwise noted.  DIAGNOSTIC FINDINGS: MRI of the brain 03/18/2020 showed multiple T2/FLAIR hyperintense foci in the periventricular, juxtacortical and deep white matter.  There were no infratentorial lesions noted.  None of the foci enhanced.   Due to severe claustrophobia, the study was done with conscious sedation in the hospital   COGNITION: Overall cognitive status: Within functional limits for tasks assessed   SENSATION: {sensation:27233}  COORDINATION: ***  EDEMA:  {edema:24020}  MUSCLE TONE: {LE tone:25568}  MUSCLE LENGTH: Hamstrings: Right *** deg;  Left *** deg Maisie Fus test: Right *** deg; Left *** deg  DTRs:  {DTR SITE:24025}  POSTURE: {posture:25561}  LOWER EXTREMITY ROM:     {AROM/PROM:27142}  Right Eval Left Eval  Hip flexion    Hip extension    Hip abduction    Hip adduction    Hip internal rotation    Hip external rotation    Knee flexion    Knee extension  Ankle dorsiflexion    Ankle plantarflexion    Ankle inversion    Ankle eversion     (Blank rows = not tested)  LOWER EXTREMITY MMT:    MMT Right Eval Left Eval  Hip flexion    Hip extension    Hip abduction    Hip adduction    Hip internal rotation    Hip external rotation    Knee flexion    Knee extension    Ankle dorsiflexion    Ankle plantarflexion    Ankle inversion    Ankle eversion    (Blank rows = not tested)  BED MOBILITY:  {Bed mobility:24027}  TRANSFERS: Assistive device utilized: {Assistive devices:23999}  Sit to stand: {Levels of assistance:24026} Stand to sit: {Levels of assistance:24026} Chair to chair: {Levels of assistance:24026} Floor: {Levels of assistance:24026}  RAMP:  Level of Assistance: {Levels of assistance:24026} Assistive device utilized: {Assistive devices:23999} Ramp Comments: ***  CURB:  Level of Assistance: {Levels of assistance:24026} Assistive device utilized: {Assistive devices:23999} Curb Comments: ***  STAIRS: Level of Assistance: {Levels of assistance:24026} Stair Negotiation Technique: {Stair Technique:27161} with {Rail Assistance:27162} Number of Stairs: ***  Height of Stairs: ***  Comments: ***  GAIT: Gait pattern: {gait characteristics:25376} Distance walked: *** Assistive device utilized: {Assistive devices:23999} Level of assistance: {Levels of assistance:24026} Comments: ***  FUNCTIONAL TESTS:  {Functional tests:24029}  PATIENT SURVEYS:  {rehab surveys:24030}  TODAY'S TREATMENT:                                                                                                                               DATE: 02/05/23     PATIENT EDUCATION: Education details: goals, POC Person educated: Patient Education method: Explanation, Demonstration, Tactile cues, and Verbal cues Education comprehension: verbalized understanding, returned demonstration, verbal cues required, and tactile cues required  HOME EXERCISE PROGRAM: ***  GOALS: Goals reviewed with patient? Yes  SHORT TERM GOALS: Target date: ***  Patient will be independent in home exercise program to improve strength/mobility for better functional independence with ADLs.  Baseline: Goal status: INITIAL  2.  *** Baseline:  Goal status: INITIAL  3.  *** Baseline:  Goal status: INITIAL  4.  *** Baseline:  Goal status: INITIAL  5.  *** Baseline:  Goal status: INITIAL  6.  *** Baseline:  Goal status: INITIAL  LONG TERM GOALS: Target date: ***  Patient will increase FOTO score to equal to or greater than *** to demonstrate statistically significant improvement in mobility and quality of life  Baseline:  Goal status: INITIAL  2.  Patient (> 78 years old) will complete five times sit to stand test in < 15 seconds indicating an increased LE strength and improved balance.  Baseline:  Goal status: INITIAL  3.  Patient will increase 10 meter walk test to >0.5 m/s as to improve gait speed for better community ambulation and to reduce fall risk.  Baseline:  Goal status: INITIAL  4.  *** Baseline:  Goal status: INITIAL  5.  *** Baseline:  Goal status: INITIAL  6.  *** Baseline:  Goal status: INITIAL  ASSESSMENT:  CLINICAL IMPRESSION: Patient is a *** y.o. *** who was seen today for physical therapy evaluation and treatment for ***.   OBJECTIVE IMPAIRMENTS: {opptimpairments:25111}.   ACTIVITY LIMITATIONS: {activitylimitations:27494}  PARTICIPATION LIMITATIONS: {participationrestrictions:25113}  PERSONAL FACTORS: {Personal factors:25162} are also affecting patient's functional  outcome.   REHAB POTENTIAL: {rehabpotential:25112}  CLINICAL DECISION MAKING: {clinical decision making:25114}  EVALUATION COMPLEXITY: {Evaluation complexity:25115}  PLAN:  PT FREQUENCY: {rehab frequency:25116}  PT DURATION: {rehab duration:25117}  PLANNED INTERVENTIONS: {rehab planned interventions:25118::"Therapeutic exercises","Therapeutic activity","Neuromuscular re-education","Balance training","Gait training","Patient/Family education","Self Care","Joint mobilization"}  PLAN FOR NEXT SESSION: ***   Precious Bard, PT 02/05/2023, 5:10 PM

## 2023-02-06 ENCOUNTER — Ambulatory Visit: Payer: PPO | Attending: Neurology

## 2023-02-06 DIAGNOSIS — R262 Difficulty in walking, not elsewhere classified: Secondary | ICD-10-CM | POA: Insufficient documentation

## 2023-02-06 DIAGNOSIS — R269 Unspecified abnormalities of gait and mobility: Secondary | ICD-10-CM | POA: Insufficient documentation

## 2023-02-06 DIAGNOSIS — M6281 Muscle weakness (generalized): Secondary | ICD-10-CM | POA: Insufficient documentation

## 2023-02-06 DIAGNOSIS — R278 Other lack of coordination: Secondary | ICD-10-CM | POA: Insufficient documentation

## 2023-02-06 DIAGNOSIS — G35 Multiple sclerosis: Secondary | ICD-10-CM | POA: Insufficient documentation

## 2023-02-06 DIAGNOSIS — R2689 Other abnormalities of gait and mobility: Secondary | ICD-10-CM | POA: Diagnosis not present

## 2023-02-06 DIAGNOSIS — R2681 Unsteadiness on feet: Secondary | ICD-10-CM | POA: Diagnosis not present

## 2023-02-06 IMAGING — DX DG CHEST 1V PORT
1 series · 1 of 1 positions shown · non-contrast
Comparison: Portable chest 01/16/2017 and earlier.

CLINICAL DATA: 60-year-old female with possible sepsis. Generalized
weakness.

EXAM:
PORTABLE CHEST 1 VIEW

[chest ap]
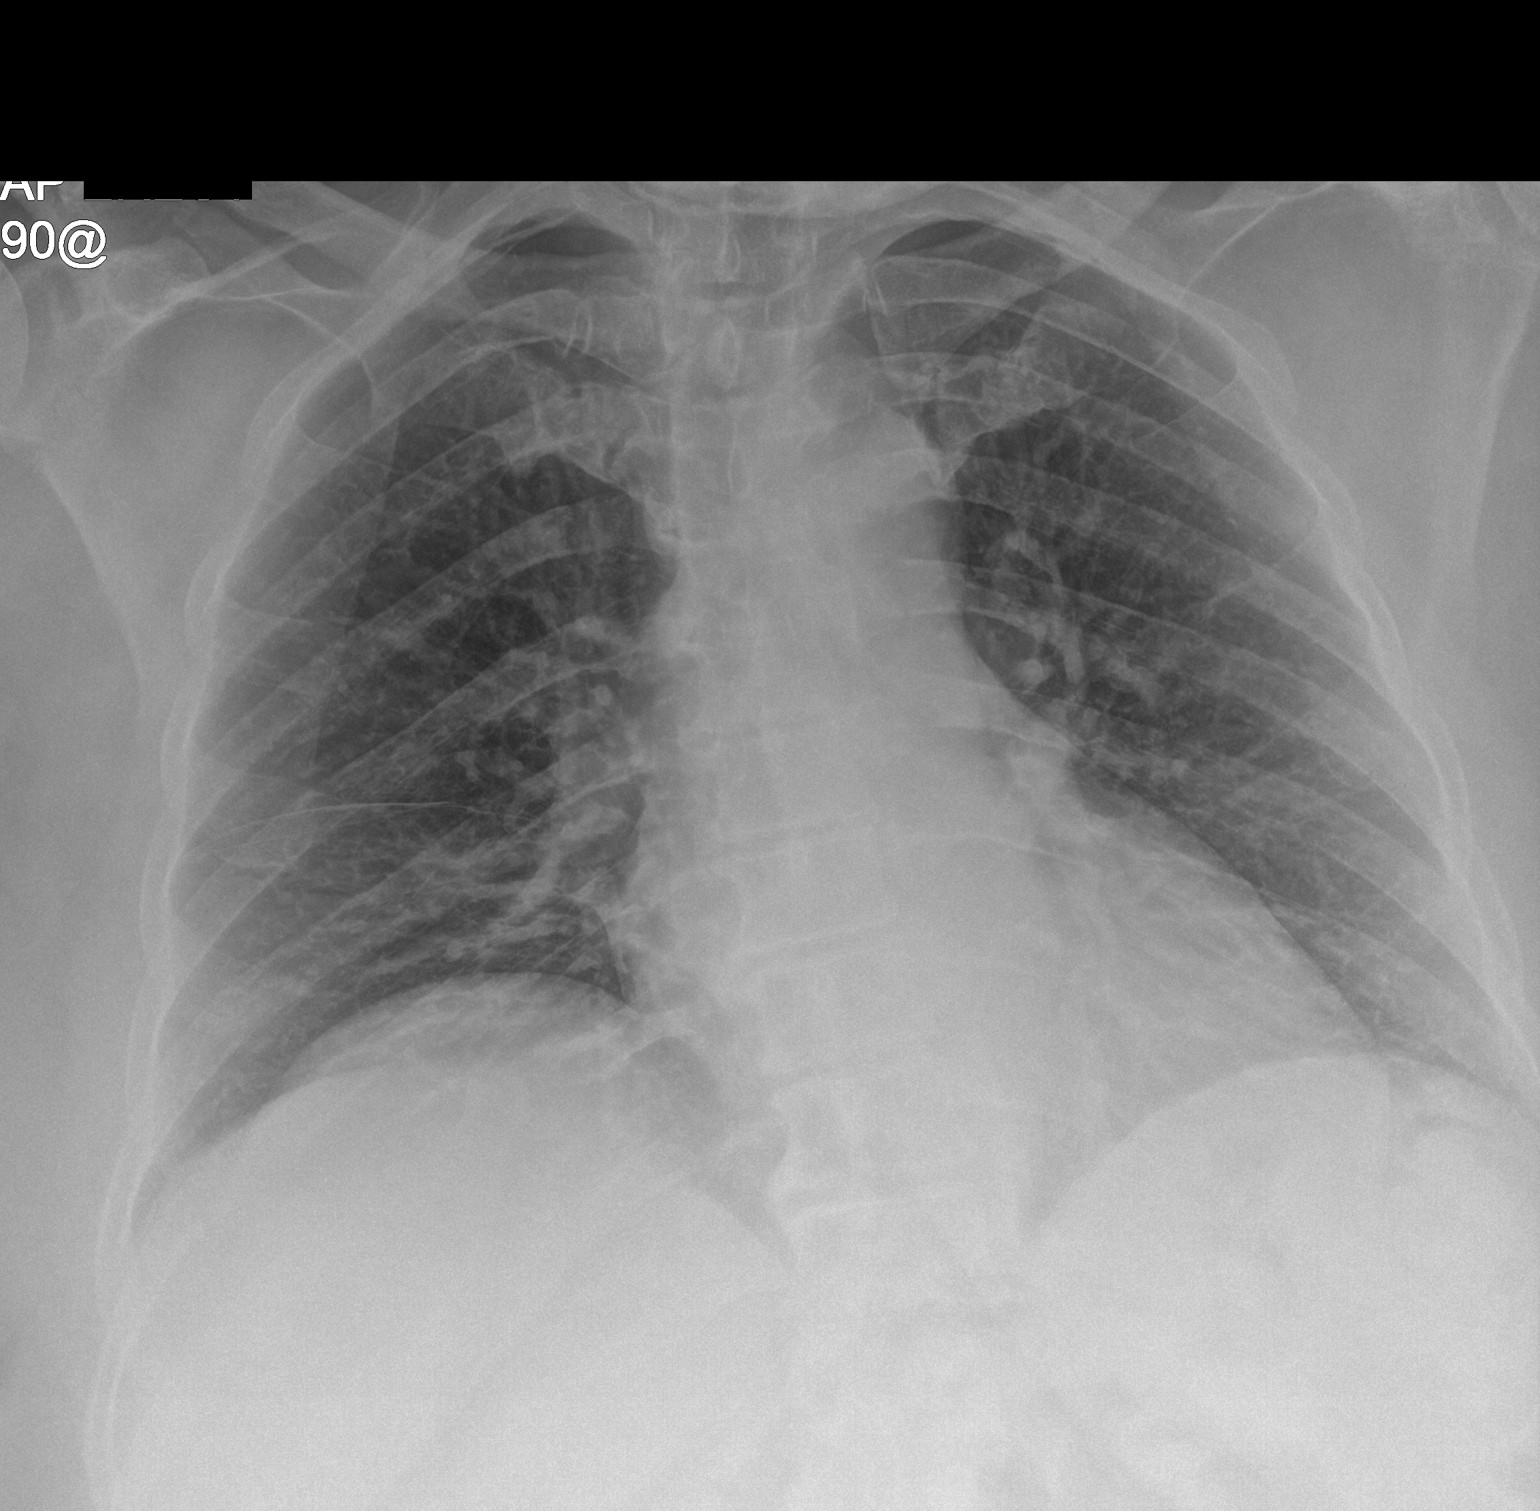

[1 of 1 positions shown; findings below may reference images not displayed]

FINDINGS: Portable AP upright view at 6820 hours. Mildly lower lung volumes.
Normal cardiac size and mediastinal contours. Visualized tracheal
air column is within normal limits. Mild eventration of the right
hemidiaphragm, normal variant. Allowing for portable technique the
lungs are clear. No pneumothorax or pleural effusion. Mild
scoliosis. No acute osseous abnormality identified. Paucity of bowel
gas in the upper abdomen.
IMPRESSION: Lower lung volumes.  Otherwise negative portable chest.

## 2023-02-08 ENCOUNTER — Ambulatory Visit: Payer: PPO

## 2023-02-08 DIAGNOSIS — R278 Other lack of coordination: Secondary | ICD-10-CM

## 2023-02-08 DIAGNOSIS — R2681 Unsteadiness on feet: Secondary | ICD-10-CM

## 2023-02-08 DIAGNOSIS — M6281 Muscle weakness (generalized): Secondary | ICD-10-CM

## 2023-02-08 DIAGNOSIS — G35 Multiple sclerosis: Secondary | ICD-10-CM

## 2023-02-08 DIAGNOSIS — R262 Difficulty in walking, not elsewhere classified: Secondary | ICD-10-CM

## 2023-02-08 DIAGNOSIS — R2689 Other abnormalities of gait and mobility: Secondary | ICD-10-CM

## 2023-02-08 DIAGNOSIS — R269 Unspecified abnormalities of gait and mobility: Secondary | ICD-10-CM

## 2023-02-08 NOTE — Therapy (Signed)
OUTPATIENT PHYSICAL THERAPY NEURO TREATMENT   Patient Name: Lisa Williams MRN: [id number] DOB:May 21, 1960, 62 y.o., female Today's Date: 02/08/2023   PCP: Lauro Regulus MD REFERRING PROVIDER: Lauro Regulus MD  END OF SESSION:  PT End of Session - 02/08/23 1106     Visit Number 2    Number of Visits 24    Date for PT Re-Evaluation 05/01/23    PT Start Time 1102    PT Stop Time 1145    PT Time Calculation (min) 43 min    Equipment Utilized During Treatment Gait belt    Activity Tolerance Patient tolerated treatment well    Behavior During Therapy [facility] for tasks assessed/performed              Past Medical History:  Diagnosis Date   Abdominal pain, right upper quadrant    Back pain    Calculus of kidney 12/09/2013   Chronic back pain    unspecified   Chronic left shoulder pain 07/19/2015   Complication of anesthesia    Functional disorder of bladder    other   Galactorrhea 11/26/2014   Chronic    Hereditary and idiopathic neuropathy 08/19/2013   History of kidney stones    HPV test positive    Hypercholesteremia 08/19/2013   Hypertension    Incomplete bladder emptying    Microscopic hematuria    MS (multiple sclerosis) (HCC)    Muscle spasticity 05/21/2014   Nonspecific findings on examination of urine    other   Osteopenia    PONV (postoperative nausea and vomiting)    Status post laparoscopic supracervical hysterectomy 11/26/2014   Tobacco user 11/26/2014   Wrist fracture    Past Surgical History:  Procedure Laterality Date   bilateral tubal ligation  1996   BREAST CYST EXCISION Left 2002   CYST EXCISION Left 05/10/2022   Procedure: CYST REMOVAL;  Surgeon: Carolan Shiver, MD;  Location: [facility] ORS;  Service: General;  Laterality: Left;   FRACTURE SURGERY     KNEE SURGERY     right   LAPAROSCOPIC SUPRACERVICAL HYSTERECTOMY  08/05/2013   ORIF WRIST FRACTURE Left 01/17/2017   Procedure: OPEN REDUCTION INTERNAL FIXATION (ORIF)  WRIST FRACTURE;  Surgeon: Lyndle Herrlich, MD;  Location: [facility] OR[facility];  Service: Orthopedics;  Laterality: Left;   RADIOLOGY WITH ANESTHESIA N/A 03/18/2020   Procedure: MRI WITH ANESTHESIA CERVICAL SPINE AND BRAIN  WITH AND WITHOUT CONTRAST;  Surgeon: Radiologist, Medication, MD;  Location: [facility];  Service: Radiology;  Laterality: N/A;   TUBAL LIGATION Bilateral    VAGINAL HYSTERECTOMY  03/2006   Patient Active Problem List   Diagnosis Date Noted   Hypokalemia 07/29/2022   Weakness of both lower extremities 07/29/2022   Abscess of left groin 11/15/2021   Abnormal LFTs 11/15/2021   Acute respiratory disease due to COVID-19 virus 11/21/2020   Weakness    Hypoalbuminemia due to protein-calorie malnutrition [facility])    Neurogenic bowel    Neurogenic bladder    Labile blood pressure    Neuropathic pain    Abscess of female pelvis    SVT (supraventricular tachycardia) (HCC)    Radial styloid tenosynovitis 03/12/2018   Wheelchair dependence 02/27/2018   Localized osteoporosis with current pathological fracture with routine healing 01/19/2017   Wrist fracture 01/16/2017   Sprain of ankle 03/23/2016   Closed fracture of lateral malleolus 03/16/2016   Health care maintenance 01/24/2016   Blood pressure elevated without history of HTN 10/25/2015   Essential hypertension 10/25/2015  Multiple sclerosis (HCC) 10/02/2015   Chronic left shoulder pain 07/19/2015   Multiple sclerosis exacerbation (HCC) 07/14/2015   MS (multiple sclerosis) (HCC) 11/26/2014   Increased body mass index 11/26/2014   HPV test positive 11/26/2014   Status post laparoscopic supracervical hysterectomy 11/26/2014   Galactorrhea 11/26/2014   Back ache 05/21/2014   Adiposity 05/21/2014   Disordered sleep 05/21/2014   Muscle spasticity 05/21/2014   Spasticity 05/21/2014   Calculus of kidney 12/09/2013   Renal colic 12/09/2013   Hypercholesteremia 08/19/2013   Hereditary and idiopathic neuropathy 08/19/2013    Hypercholesterolemia without hypertriglyceridemia 08/19/2013   Bladder infection, chronic 07/25/2012   Disorder of bladder function 07/25/2012   Incomplete bladder emptying 07/25/2012   Microscopic hematuria 07/25/2012   Right upper quadrant pain 07/25/2012    ONSET DATE: 1995  REFERRING DIAG: MS, weakness, other closed fracture of proximal end of R tibia  THERAPY DIAG:  Muscle weakness (generalized)  Difficulty in walking, not elsewhere classified  Unsteadiness on feet  Abnormality of gait and mobility  Other lack of coordination  Other abnormalities of gait and mobility  Multiple sclerosis exacerbation (HCC)  Rationale for Evaluation and Treatment: Rehabilitation  SUBJECTIVE:                                                                                                                                                                                             SUBJECTIVE STATEMENT:  Pt reports she is doing well.  She is looking forward to placing some weight in the LE without the brace today.  Pt accompanied by: self  PERTINENT HISTORY: Patient is returning to PT s/p ORIF of R tibia shaft fracture 09/23/2022. Patient has weakness in BLE with RLE>LLE. She drives with hand controls. Patient has been diagnosed with MS in 1995. PMH includes: back pain, CBP, chronic L shoulder pain, galactorrhea, neuropathy, HPV, hypercholestermeia, HTN, MS, osteopenia, PONV, wrist fracture. Additional order for other closed fracture of proximal end of R tibia with routine healing. Still has to use [employer].   PAIN:  Are you having pain?  Sometimes have pain in R knee  PRECAUTIONS: Fall  RED FLAGS: None   WEIGHT BEARING RESTRICTIONS: No  FALLS: Has patient fallen in last 6 months? Yes. Number of falls 1  LIVING ENVIRONMENT: Lives with: lives with their family Lives in: House/apartment Stairs:  ramp from garage into house Has following equipment at home: Dan Humphreys - 2 wheeled, shower  chair, Grab bars, and Ramped entry  PLOF: Independent with household mobility with device  PATIENT GOALS: get up and pivot and turn, sit to stands. Return to walk, go to  bathroom on her own   OBJECTIVE:  Note: Objective measures were completed at Evaluation unless otherwise noted.  DIAGNOSTIC FINDINGS: MRI of the brain 03/18/2020 showed multiple T2/FLAIR hyperintense foci in the periventricular, juxtacortical and deep white matter.  There were no infratentorial lesions noted.  None of the foci enhanced.   Due to severe claustrophobia, the study was done with conscious sedation in the hospital   COGNITION: Overall cognitive status: Within functional limits for tasks assessed   SENSATION: Lack of sensation in feet, loss in bilateral lateral aspect of knee  COORDINATION: Does not have the strength for functional LE heel slide test   MUSCLE TONE: n/a   POSTURE: rounded shoulders, posterior pelvic tilt, and flexed trunk   LOWER EXTREMITY ROM:     WFL grossly  LOWER EXTREMITY MMT:    MMT Right Eval Left Eval  Hip flexion 2- 2  Hip extension    Hip abduction 3+ 3+  Hip adduction 3 3  Hip internal rotation    Hip external rotation    Knee flexion 2 2+  Knee extension 2- 2  Ankle dorsiflexion 0 0  Ankle plantarflexion 0 0  Ankle inversion    Ankle eversion    (Blank rows = not tested)  BED MOBILITY:  Assess future session  TRANSFERS: Assistive device utilized: Environmental consultant - 2 wheeled  Sit to stand: CGA Stand to sit: CGA  RAMP:  Assess at future date   GAIT: Assess next session  FUNCTIONAL TESTS:  5 times sit to stand: unable to complete 2 consecutive sit to stands  10 meter walk test: perform in future session   PATIENT SURVEYS:  FOTO 34  TODAY'S TREATMENT: DATE: 02/08/23  Unless otherwise stated, all tasks performed below in // bars with gait belt applied, +1 CGA and +1 for wheelchair follow.  Neuro:  Standing static weight shifts in // bars, 30-45 sec  bouts x3  Step forward/backward attempts, x2 each LE leading  Ambulation in // bars with wheelchair follow, x3 length of // bars  Ambulation in // bars with backwards walking x 1 full length of // bars  Pt requires significant therapeutic rest breaks throughout the session due to fatigue.   PATIENT EDUCATION: Education details: goals, POC Person educated: Patient Education method: Explanation, Demonstration, Tactile cues, and Verbal cues Education comprehension: verbalized understanding, returned demonstration, verbal cues required, and tactile cues required  HOME EXERCISE PROGRAM:  To be given at next visit.   GOALS: Goals reviewed with patient? Yes  SHORT TERM GOALS: Target date: 03/06/2023  Patient will be independent in home exercise program to improve strength/mobility for better functional independence with ADLs.  Baseline: 10/1: give next session  Goal status: INITIAL   LONG TERM GOALS: Target date: 05/01/2023  Patient will increase FOTO score to equal to or greater than 48 to demonstrate statistically significant improvement in mobility and quality of life  Baseline: 10/1: 34%  Goal status: INITIAL  2.  Patient (> 27 years old) will complete five times sit to stand test in < 15 seconds indicating an increased LE strength and improved balance.  Baseline: 10/1; unable to tolerate two consecutive sit to stands Goal status: INITIAL  3.  Patient will increase 10 meter walk test to >0.5 m/s as to improve gait speed for better community ambulation and to reduce fall risk.  Baseline: 10/1: not tested this session  Goal status: INITIAL  4.  Patient will transfer independently at home and no longer require use of [place]  Plus Lift for increased independence.  Baseline: 10/1: reliant upon lift  Goal status: INITIAL  Goal status: INITIAL  ASSESSMENT:  CLINICAL IMPRESSION:  Pt put forth great effort throughout the session today.  Pt noted to have some difficulty with  weight shifting completely on the R LE which limited her advancing of the L LE, and requiring a sock to be donned on the forefoot of the L LE to advance it easier.  Pt able to progress from weight shifts to increased ambulation distance and then ambulating backwards in the // bars.   Pt will continue to benefit from skilled therapy to address remaining deficits in order to improve overall QoL and return to PLOF.      OBJECTIVE IMPAIRMENTS: Abnormal gait, decreased activity tolerance, decreased balance, decreased coordination, decreased endurance, decreased mobility, difficulty walking, decreased strength, impaired perceived functional ability, improper body mechanics, and postural dysfunction.   ACTIVITY LIMITATIONS: carrying, lifting, bending, standing, squatting, sleeping, transfers, bed mobility, bathing, toileting, dressing, reach over head, hygiene/grooming, locomotion level, and caring for others  PARTICIPATION LIMITATIONS: meal prep, cleaning, laundry, driving, shopping, community activity, yard work, and church  PERSONAL FACTORS: back pain, CBP, chronic L shoulder pain, galactorrhea, neuropathy, HPV, hypercholestermeia, HTN, MS, osteopenia, PONV, wrist fracture are also affecting patient's functional outcome.   REHAB POTENTIAL: Good  CLINICAL DECISION MAKING: Evolving/moderate complexity  EVALUATION COMPLEXITY: Moderate  PLAN:  PT FREQUENCY: 2x/week  PT DURATION: 12 weeks  PLANNED INTERVENTIONS: Therapeutic exercises, Therapeutic activity, Neuromuscular re-education, Balance training, Gait training, Patient/Family education, Self Care, Joint mobilization, Stair training, Vestibular training, Canalith repositioning, Orthotic/Fit training, DME instructions, Dry Needling, Electrical stimulation, Spinal mobilization, Cryotherapy, Moist heat, Compression bandaging, scar mobilization, Taping, Traction, Ultrasound, Manual therapy, and Re-evaluation  PLAN FOR NEXT SESSION:   Continue with  standing weight shift in // bars, walking in // bars, and bed mobility if time    Nolon Bussing, PT, DPT Physical Therapist - [facility]  02/08/23, 5:09 PM

## 2023-02-12 NOTE — Therapy (Incomplete)
OUTPATIENT PHYSICAL THERAPY NEURO TREATMENT   Patient Name: Lisa Williams MRN: [id number] DOB:24-Apr-1961, 62 y.o., female Today's Date: 02/12/2023   PCP: Lauro Regulus MD REFERRING PROVIDER: Lauro Regulus MD  END OF SESSION:     Past Medical History:  Diagnosis Date   Abdominal pain, right upper quadrant    Back pain    Calculus of kidney 12/09/2013   Chronic back pain    unspecified   Chronic left shoulder pain 07/19/2015   Complication of anesthesia    Functional disorder of bladder    other   Galactorrhea 11/26/2014   Chronic    Hereditary and idiopathic neuropathy 08/19/2013   History of kidney stones    HPV test positive    Hypercholesteremia 08/19/2013   Hypertension    Incomplete bladder emptying    Microscopic hematuria    MS (multiple sclerosis) (HCC)    Muscle spasticity 05/21/2014   Nonspecific findings on examination of urine    other   Osteopenia    PONV (postoperative nausea and vomiting)    Status post laparoscopic supracervical hysterectomy 11/26/2014   Tobacco user 11/26/2014   Wrist fracture    Past Surgical History:  Procedure Laterality Date   bilateral tubal ligation  1996   BREAST CYST EXCISION Left 2002   CYST EXCISION Left 05/10/2022   Procedure: CYST REMOVAL;  Surgeon: Carolan Shiver, MD;  Location: [facility] OR[facility];  Service: General;  Laterality: Left;   FRACTURE SURGERY     KNEE SURGERY     right   LAPAROSCOPIC SUPRACERVICAL HYSTERECTOMY  08/05/2013   ORIF WRIST FRACTURE Left 01/17/2017   Procedure: OPEN REDUCTION INTERNAL FIXATION (ORIF) WRIST FRACTURE;  Surgeon: Lyndle Herrlich, MD;  Location: [facility] OR[facility];  Service: Orthopedics;  Laterality: Left;   RADIOLOGY WITH ANESTHESIA N/A 03/18/2020   Procedure: MRI WITH ANESTHESIA CERVICAL SPINE AND BRAIN  WITH AND WITHOUT CONTRAST;  Surgeon: Radiologist, Medication, MD;  Location: [facility] OR;  Service: Radiology;  Laterality: N/A;   TUBAL LIGATION Bilateral    VAGINAL  HYSTERECTOMY  03/2006   Patient Active Problem List   Diagnosis Date Noted   Hypokalemia 07/29/2022   Weakness of both lower extremities 07/29/2022   Abscess of left groin 11/15/2021   Abnormal LFTs 11/15/2021   Acute respiratory disease due to COVID-19 virus 11/21/2020   Weakness    Hypoalbuminemia due to protein-calorie malnutrition [facility])    Neurogenic bowel    Neurogenic bladder    Labile blood pressure    Neuropathic pain    Abscess of female pelvis    SVT (supraventricular tachycardia) (HCC)    Radial styloid tenosynovitis 03/12/2018   Wheelchair dependence 02/27/2018   Localized osteoporosis with current pathological fracture with routine healing 01/19/2017   Wrist fracture 01/16/2017   Sprain of ankle 03/23/2016   Closed fracture of lateral malleolus 03/16/2016   Health care maintenance 01/24/2016   Blood pressure elevated without history of HTN 10/25/2015   Essential hypertension 10/25/2015   Multiple sclerosis (HCC) 10/02/2015   Chronic left shoulder pain 07/19/2015   Multiple sclerosis exacerbation (HCC) 07/14/2015   MS (multiple sclerosis) (HCC) 11/26/2014   Increased body mass index 11/26/2014   HPV test positive 11/26/2014   Status post laparoscopic supracervical hysterectomy 11/26/2014   Galactorrhea 11/26/2014   Back ache 05/21/2014   Adiposity 05/21/2014   Disordered sleep 05/21/2014   Muscle spasticity 05/21/2014   Spasticity 05/21/2014   Calculus of kidney 12/09/2013   Renal colic 12/09/2013   Hypercholesteremia 08/19/2013  Hereditary and idiopathic neuropathy 08/19/2013   Hypercholesterolemia without hypertriglyceridemia 08/19/2013   Bladder infection, chronic 07/25/2012   Disorder of bladder function 07/25/2012   Incomplete bladder emptying 07/25/2012   Microscopic hematuria 07/25/2012   Right upper quadrant pain 07/25/2012    ONSET DATE: 1995  REFERRING DIAG: MS, weakness, other closed fracture of proximal end of R tibia  THERAPY DIAG:  No  diagnosis found.  Rationale for Evaluation and Treatment: Rehabilitation  SUBJECTIVE:                                                                                                                                                                                             SUBJECTIVE STATEMENT:  ***  Pt accompanied by: self  PERTINENT HISTORY: Patient is returning to PT s/p ORIF of R tibia shaft fracture 09/23/2022. Patient has weakness in BLE with RLE>LLE. She drives with hand controls. Patient has been diagnosed with MS in 1995. PMH includes: back pain, CBP, chronic L shoulder pain, galactorrhea, neuropathy, HPV, hypercholestermeia, HTN, MS, osteopenia, PONV, wrist fracture. Additional order for other closed fracture of proximal end of R tibia with routine healing. Still has to use [employer].   PAIN:  Are you having pain?  Sometimes have pain in R knee  PRECAUTIONS: Fall  RED FLAGS: None   WEIGHT BEARING RESTRICTIONS: No  FALLS: Has patient fallen in last 6 months? Yes. Number of falls 1  LIVING ENVIRONMENT: Lives with: lives with their family Lives in: House/apartment Stairs:  ramp from garage into house Has following equipment at home: Dan Humphreys - 2 wheeled, shower chair, Grab bars, and Ramped entry  PLOF: Independent with household mobility with device  PATIENT GOALS: get up and pivot and turn, sit to stands. Return to walk, go to bathroom on her own   OBJECTIVE:  Note: Objective measures were completed at Evaluation unless otherwise noted.  DIAGNOSTIC FINDINGS: MRI of the brain 03/18/2020 showed multiple T2/FLAIR hyperintense foci in the periventricular, juxtacortical and deep white matter.  There were no infratentorial lesions noted.  None of the foci enhanced.   Due to severe claustrophobia, the study was done with conscious sedation in the hospital   COGNITION: Overall cognitive status: Within functional limits for tasks assessed   SENSATION: Lack of sensation in  feet, loss in bilateral lateral aspect of knee  COORDINATION: Does not have the strength for functional LE heel slide test   MUSCLE TONE: n/a   POSTURE: rounded shoulders, posterior pelvic tilt, and flexed trunk   LOWER EXTREMITY ROM:     WFL grossly  LOWER EXTREMITY MMT:    MMT Right Eval  Left Eval  Hip flexion 2- 2  Hip extension    Hip abduction 3+ 3+  Hip adduction 3 3  Hip internal rotation    Hip external rotation    Knee flexion 2 2+  Knee extension 2- 2  Ankle dorsiflexion 0 0  Ankle plantarflexion 0 0  Ankle inversion    Ankle eversion    (Blank rows = not tested)  BED MOBILITY:  Assess future session  TRANSFERS: Assistive device utilized: Environmental consultant - 2 wheeled  Sit to stand: CGA Stand to sit: CGA  RAMP:  Assess at future date   GAIT: Assess next session  FUNCTIONAL TESTS:  5 times sit to stand: unable to complete 2 consecutive sit to stands  10 meter walk test: perform in future session   PATIENT SURVEYS:  FOTO 34  TODAY'S TREATMENT: DATE: 02/12/23  Unless otherwise stated, all tasks performed below in // bars with gait belt applied, +1 CGA and +1 for wheelchair follow.  Neuro:  Standing static weight shifts in // bars, 30-45 sec bouts x3  Step forward/backward attempts, x2 each LE leading  Ambulation in // bars with wheelchair follow, x3 length of // bars  Ambulation in // bars with backwards walking x 1 full length of // bars  Pt requires significant therapeutic rest breaks throughout the session due to fatigue.   PATIENT EDUCATION: Education details: goals, POC Person educated: Patient Education method: Explanation, Demonstration, Tactile cues, and Verbal cues Education comprehension: verbalized understanding, returned demonstration, verbal cues required, and tactile cues required  HOME EXERCISE PROGRAM:  To be given at next visit.   GOALS: Goals reviewed with patient? Yes  SHORT TERM GOALS: Target date:  03/06/2023  Patient will be independent in home exercise program to improve strength/mobility for better functional independence with ADLs.  Baseline: 10/1: give next session  Goal status: INITIAL   LONG TERM GOALS: Target date: 05/01/2023  Patient will increase FOTO score to equal to or greater than 48 to demonstrate statistically significant improvement in mobility and quality of life  Baseline: 10/1: 34%  Goal status: INITIAL  2.  Patient (> 43 years old) will complete five times sit to stand test in < 15 seconds indicating an increased LE strength and improved balance.  Baseline: 10/1; unable to tolerate two consecutive sit to stands Goal status: INITIAL  3.  Patient will increase 10 meter walk test to >0.5 m/s as to improve gait speed for better community ambulation and to reduce fall risk.  Baseline: 10/1: not tested this session  Goal status: INITIAL  4.  Patient will transfer independently at home and no longer require use of [employer] Lift for increased independence.  Baseline: 10/1: reliant upon lift  Goal status: INITIAL  Goal status: INITIAL  ASSESSMENT:  CLINICAL IMPRESSION:  (***  Pt will continue to benefit from skilled therapy to address remaining deficits in order to improve overall QoL and return to PLOF.      OBJECTIVE IMPAIRMENTS: Abnormal gait, decreased activity tolerance, decreased balance, decreased coordination, decreased endurance, decreased mobility, difficulty walking, decreased strength, impaired perceived functional ability, improper body mechanics, and postural dysfunction.   ACTIVITY LIMITATIONS: carrying, lifting, bending, standing, squatting, sleeping, transfers, bed mobility, bathing, toileting, dressing, reach over head, hygiene/grooming, locomotion level, and caring for others  PARTICIPATION LIMITATIONS: meal prep, cleaning, laundry, driving, shopping, community activity, yard work, and church  PERSONAL FACTORS: back pain, CBP, chronic L  shoulder pain, galactorrhea, neuropathy, HPV, hypercholestermeia, HTN, MS, osteopenia, PONV, wrist fracture  are also affecting patient's functional outcome.   REHAB POTENTIAL: Good  CLINICAL DECISION MAKING: Evolving/moderate complexity  EVALUATION COMPLEXITY: Moderate  PLAN:  PT FREQUENCY: 2x/week  PT DURATION: 12 weeks  PLANNED INTERVENTIONS: Therapeutic exercises, Therapeutic activity, Neuromuscular re-education, Balance training, Gait training, Patient/Family education, Self Care, Joint mobilization, Stair training, Vestibular training, Canalith repositioning, Orthotic/Fit training, DME instructions, Dry Needling, Electrical stimulation, Spinal mobilization, Cryotherapy, Moist heat, Compression bandaging, scar mobilization, Taping, Traction, Ultrasound, Manual therapy, and Re-evaluation  PLAN FOR NEXT SESSION:   Continue with standing weight shift in // bars, walking in // bars, and bed mobility if time   Precious Bard PT  Physical Therapist - [facility]  02/12/23, 4:56 PM

## 2023-02-13 ENCOUNTER — Ambulatory Visit: Payer: PPO

## 2023-02-13 DIAGNOSIS — R269 Unspecified abnormalities of gait and mobility: Secondary | ICD-10-CM

## 2023-02-13 DIAGNOSIS — R2681 Unsteadiness on feet: Secondary | ICD-10-CM

## 2023-02-13 DIAGNOSIS — M6281 Muscle weakness (generalized): Secondary | ICD-10-CM

## 2023-02-13 DIAGNOSIS — R262 Difficulty in walking, not elsewhere classified: Secondary | ICD-10-CM

## 2023-02-13 DIAGNOSIS — R278 Other lack of coordination: Secondary | ICD-10-CM

## 2023-02-13 NOTE — Therapy (Signed)
OUTPATIENT PHYSICAL THERAPY NEURO TREATMENT   Patient Name: Lisa Williams MRN: [id number] DOB:07/21/1960, 62 y.o., female Today's Date: 02/13/2023   PCP: Lauro Regulus MD REFERRING PROVIDER: Lauro Regulus MD  END OF SESSION:  PT End of Session - 02/13/23 1013     Visit Number 3    Number of Visits 24    Date for PT Re-Evaluation 05/01/23    PT Start Time 1015    PT Stop Time 1058    PT Time Calculation (min) 43 min    Equipment Utilized During Treatment Gait belt    Activity Tolerance Patient tolerated treatment well    Behavior During Therapy [facility] for tasks assessed/performed               Past Medical History:  Diagnosis Date   Abdominal pain, right upper quadrant    Back pain    Calculus of kidney 12/09/2013   Chronic back pain    unspecified   Chronic left shoulder pain 07/19/2015   Complication of anesthesia    Functional disorder of bladder    other   Galactorrhea 11/26/2014   Chronic    Hereditary and idiopathic neuropathy 08/19/2013   History of kidney stones    HPV test positive    Hypercholesteremia 08/19/2013   Hypertension    Incomplete bladder emptying    Microscopic hematuria    MS (multiple sclerosis) (HCC)    Muscle spasticity 05/21/2014   Nonspecific findings on examination of urine    other   Osteopenia    PONV (postoperative nausea and vomiting)    Status post laparoscopic supracervical hysterectomy 11/26/2014   Tobacco user 11/26/2014   Wrist fracture    Past Surgical History:  Procedure Laterality Date   bilateral tubal ligation  1996   BREAST CYST EXCISION Left 2002   CYST EXCISION Left 05/10/2022   Procedure: CYST REMOVAL;  Surgeon: Carolan Shiver, MD;  Location: [facility] ORS;  Service: General;  Laterality: Left;   FRACTURE SURGERY     KNEE SURGERY     right   LAPAROSCOPIC SUPRACERVICAL HYSTERECTOMY  08/05/2013   ORIF WRIST FRACTURE Left 01/17/2017   Procedure: OPEN REDUCTION INTERNAL FIXATION  (ORIF) WRIST FRACTURE;  Surgeon: Lyndle Herrlich, MD;  Location: [facility] ORS;  Service: Orthopedics;  Laterality: Left;   RADIOLOGY WITH ANESTHESIA N/A 03/18/2020   Procedure: MRI WITH ANESTHESIA CERVICAL SPINE AND BRAIN  WITH AND WITHOUT CONTRAST;  Surgeon: Radiologist, Medication, MD;  Location: [facility];  Service: Radiology;  Laterality: N/A;   TUBAL LIGATION Bilateral    VAGINAL HYSTERECTOMY  03/2006   Patient Active Problem List   Diagnosis Date Noted   Hypokalemia 07/29/2022   Weakness of both lower extremities 07/29/2022   Abscess of left groin 11/15/2021   Abnormal LFTs 11/15/2021   Acute respiratory disease due to COVID-19 virus 11/21/2020   Weakness    Hypoalbuminemia due to protein-calorie malnutrition [facility])    Neurogenic bowel    Neurogenic bladder    Labile blood pressure    Neuropathic pain    Abscess of female pelvis    SVT (supraventricular tachycardia) (HCC)    Radial styloid tenosynovitis 03/12/2018   Wheelchair dependence 02/27/2018   Localized osteoporosis with current pathological fracture with routine healing 01/19/2017   Wrist fracture 01/16/2017   Sprain of ankle 03/23/2016   Closed fracture of lateral malleolus 03/16/2016   Health care maintenance 01/24/2016   Blood pressure elevated without history of HTN 10/25/2015   Essential hypertension  10/25/2015   Multiple sclerosis (HCC) 10/02/2015   Chronic left shoulder pain 07/19/2015   Multiple sclerosis exacerbation (HCC) 07/14/2015   MS (multiple sclerosis) (HCC) 11/26/2014   Increased body mass index 11/26/2014   HPV test positive 11/26/2014   Status post laparoscopic supracervical hysterectomy 11/26/2014   Galactorrhea 11/26/2014   Back ache 05/21/2014   Adiposity 05/21/2014   Disordered sleep 05/21/2014   Muscle spasticity 05/21/2014   Spasticity 05/21/2014   Calculus of kidney 12/09/2013   Renal colic 12/09/2013   Hypercholesteremia 08/19/2013   Hereditary and idiopathic neuropathy 08/19/2013    Hypercholesterolemia without hypertriglyceridemia 08/19/2013   Bladder infection, chronic 07/25/2012   Disorder of bladder function 07/25/2012   Incomplete bladder emptying 07/25/2012   Microscopic hematuria 07/25/2012   Right upper quadrant pain 07/25/2012    ONSET DATE: 1995  REFERRING DIAG: MS, weakness, other closed fracture of proximal end of R tibia  THERAPY DIAG:  Muscle weakness (generalized)  Difficulty in walking, not elsewhere classified  Other lack of coordination  Unsteadiness on feet  Abnormality of gait and mobility  Rationale for Evaluation and Treatment: Rehabilitation  SUBJECTIVE:                                                                                                                                                                                             SUBJECTIVE STATEMENT:  Pt reports she is doing well. She expressed one of her goals is to decrease reliance on her [employer] plus for transfers by [employer].   Pt accompanied by: self  PERTINENT HISTORY: Patient is returning to PT s/p ORIF of R tibia shaft fracture 09/23/2022. Patient has weakness in BLE with RLE>LLE. She drives with [employer] controls. Patient has been diagnosed with MS in 1995. PMH includes: back pain, CBP, chronic L shoulder pain, galactorrhea, neuropathy, HPV, hypercholestermeia, HTN, MS, osteopenia, PONV, wrist fracture. Additional order for other closed fracture of proximal end of R tibia with routine healing. Still has to use [employer].   PAIN:  Are you having pain? No  PRECAUTIONS: Fall  RED FLAGS: None   WEIGHT BEARING RESTRICTIONS: No  FALLS: Has patient fallen in last 6 months? Yes. Number of falls 1  LIVING ENVIRONMENT: Lives with: lives with their family Lives in: House/apartment Stairs:  ramp from garage into house Has following equipment at home: Dan Humphreys - 2 wheeled, shower chair, Grab bars, and Ramped entry  PLOF: Independent with household mobility with  device  PATIENT GOALS: get up and pivot and turn, sit to stands. Return to walk, go to bathroom on her own   OBJECTIVE:  Note: Objective measures were  completed at Evaluation unless otherwise noted.  DIAGNOSTIC FINDINGS: MRI of the brain 03/18/2020 showed multiple T2/FLAIR hyperintense foci in the periventricular, juxtacortical and deep white matter.  There were no infratentorial lesions noted.  None of the foci enhanced.   Due to severe claustrophobia, the study was done with conscious sedation in the hospital   COGNITION: Overall cognitive status: Within functional limits for tasks assessed   SENSATION: Lack of sensation in feet, loss in bilateral lateral aspect of knee  COORDINATION: Does not have the strength for functional LE heel slide test   MUSCLE TONE: n/a   POSTURE: rounded shoulders, posterior pelvic tilt, and flexed trunk   LOWER EXTREMITY ROM:     WFL grossly  LOWER EXTREMITY MMT:    MMT Right Eval Left Eval  Hip flexion 2- 2  Hip extension    Hip abduction 3+ 3+  Hip adduction 3 3  Hip internal rotation    Hip external rotation    Knee flexion 2 2+  Knee extension 2- 2  Ankle dorsiflexion 0 0  Ankle plantarflexion 0 0  Ankle inversion    Ankle eversion    (Blank rows = not tested)  BED MOBILITY:  Assess future session  TRANSFERS: Assistive device utilized: Environmental consultant - 2 wheeled  Sit to stand: CGA Stand to sit: CGA  RAMP:  Assess at future date   GAIT: Assess next session  FUNCTIONAL TESTS:  5 times sit to stand: unable to complete 2 consecutive sit to stands  10 meter walk test: perform in future session   PATIENT SURVEYS:  FOTO 34  TODAY'S TREATMENT: DATE: 02/13/23  Unless otherwise stated, all tasks performed below in // bars with gait belt applied, +1 CGA and +1 for wheelchair follow.  Neuro: Sit to Stands x multiple trials with upper extremity support on parallel bars (throughout session)   Static standing weight shifts in  parallel bars, 15 sec bouts x2; cues for shift to the RLE -last one hold for prolonged time  Standing hip flexion LLE in parallel bars x5, patient unable to completely lift foot off the floor   Standing hip flexion LLE in parallel bars x5, with sock assist on LE to decrease friction ; one rest break mid therapy intervention   Ambulation in parallel bars with wheelchair follow, x1 length of parallel bars, deferred further mobility in bars due to tremors in LLE   Assisted knee flexion in seated with sliding plate and PT assist x10 each LE   Pt requires significant therapeutic rest breaks throughout the session due to fatigue.   PATIENT EDUCATION: Education details: goals, POC Person educated: Patient Education method: Explanation, Demonstration, Tactile cues, and Verbal cues Education comprehension: verbalized understanding, returned demonstration, verbal cues required, and tactile cues required  HOME EXERCISE PROGRAM:  To be given at next visit.   GOALS: Goals reviewed with patient? Yes  SHORT TERM GOALS: Target date: 03/06/2023  Patient will be independent in home exercise program to improve strength/mobility for better functional independence with ADLs.  Baseline: 10/1: give next session  Goal status: INITIAL   LONG TERM GOALS: Target date: 05/01/2023  Patient will increase FOTO score to equal to or greater than 48 to demonstrate statistically significant improvement in mobility and quality of life  Baseline: 10/1: 34%  Goal status: INITIAL  2.  Patient (> 57 years old) will complete five times sit to stand test in < 15 seconds indicating an increased LE strength and improved balance.  Baseline: 10/1; unable  to tolerate two consecutive sit to stands Goal status: INITIAL  3.  Patient will increase 10 meter walk test to >0.5 m/s as to improve gait speed for better community ambulation and to reduce fall risk.  Baseline: 10/1: not tested this session  Goal status:  INITIAL  4.  Patient will transfer independently at home and no longer require use of [employer] Lift for increased independence.  Baseline: 10/1: reliant upon lift  Goal status: INITIAL  Goal status: INITIAL  ASSESSMENT:  CLINICAL IMPRESSION:  Patient participated in therapy with no adverse events and making progress towards her goals. She is improving her lower extremity strength evidenced by less reliance on upper extremities for transfers compared to initial evaluation. Pt noted to have some difficulty with weight shifting completely on the R LE which limited her advancing of the L LE, and requiring a sock to be donned on the forefoot of the L LE to advance it easier. She became fatigued with forward ambulation evidenced by left lower extremity tremors indicating a need to progress muscular endurance and strength. Pt will continue to benefit from skilled therapy to address remaining deficits in order to improve overall QoL and return to PLOF.      OBJECTIVE IMPAIRMENTS: Abnormal gait, decreased activity tolerance, decreased balance, decreased coordination, decreased endurance, decreased mobility, difficulty walking, decreased strength, impaired perceived functional ability, improper body mechanics, and postural dysfunction.   ACTIVITY LIMITATIONS: carrying, lifting, bending, standing, squatting, sleeping, transfers, bed mobility, bathing, toileting, dressing, reach over head, hygiene/grooming, locomotion level, and caring for others  PARTICIPATION LIMITATIONS: meal prep, cleaning, laundry, driving, shopping, community activity, yard work, and church  PERSONAL FACTORS: back pain, CBP, chronic L shoulder pain, galactorrhea, neuropathy, HPV, hypercholestermeia, HTN, MS, osteopenia, PONV, wrist fracture are also affecting patient's functional outcome.   REHAB POTENTIAL: Good  CLINICAL DECISION MAKING: Evolving/moderate complexity  EVALUATION COMPLEXITY: Moderate  PLAN:  PT FREQUENCY:  2x/week  PT DURATION: 12 weeks  PLANNED INTERVENTIONS: Therapeutic exercises, Therapeutic activity, Neuromuscular re-education, Balance training, Gait training, Patient/Family education, Self Care, Joint mobilization, Stair training, Vestibular training, Canalith repositioning, Orthotic/Fit training, DME instructions, Dry Needling, Electrical stimulation, Spinal mobilization, Cryotherapy, Moist heat, Compression bandaging, scar mobilization, Taping, Traction, Ultrasound, Manual therapy, and Re-evaluation  PLAN FOR NEXT SESSION:  Continue with standing weight shift in // bars, walking in // bars, and bed mobility if time, HEP     Randon Goldsmith, SPT  This entire session was performed under direct supervision and direction of a licensed Estate agent . I have personally read, edited and approve of the note as written.  Precious Bard, PT, DPT Physical Therapist - [facility]  Outpatient Physical Therapy- Main Campus [phone]

## 2023-02-14 NOTE — Therapy (Signed)
OUTPATIENT PHYSICAL THERAPY NEURO TREATMENT   Patient Name: Lisa Williams MRN: [id number] DOB:25-Apr-1961, 62 y.o., female Today's Date: 02/14/2023   PCP: Lauro Regulus MD REFERRING PROVIDER: Lauro Regulus MD  END OF SESSION:      Past Medical History:  Diagnosis Date   Abdominal pain, right upper quadrant    Back pain    Calculus of kidney 12/09/2013   Chronic back pain    unspecified   Chronic left shoulder pain 07/19/2015   Complication of anesthesia    Functional disorder of bladder    other   Galactorrhea 11/26/2014   Chronic    Hereditary and idiopathic neuropathy 08/19/2013   History of kidney stones    HPV test positive    Hypercholesteremia 08/19/2013   Hypertension    Incomplete bladder emptying    Microscopic hematuria    MS (multiple sclerosis) (HCC)    Muscle spasticity 05/21/2014   Nonspecific findings on examination of urine    other   Osteopenia    PONV (postoperative nausea and vomiting)    Status post laparoscopic supracervical hysterectomy 11/26/2014   Tobacco user 11/26/2014   Wrist fracture    Past Surgical History:  Procedure Laterality Date   bilateral tubal ligation  1996   BREAST CYST EXCISION Left 2002   CYST EXCISION Left 05/10/2022   Procedure: CYST REMOVAL;  Surgeon: Carolan Shiver, MD;  Location: [facility] ORS;  Service: General;  Laterality: Left;   FRACTURE SURGERY     KNEE SURGERY     right   LAPAROSCOPIC SUPRACERVICAL HYSTERECTOMY  08/05/2013   ORIF WRIST FRACTURE Left 01/17/2017   Procedure: OPEN REDUCTION INTERNAL FIXATION (ORIF) WRIST FRACTURE;  Surgeon: Lyndle Herrlich, MD;  Location: [facility] ORS;  Service: Orthopedics;  Laterality: Left;   RADIOLOGY WITH ANESTHESIA N/A 03/18/2020   Procedure: MRI WITH ANESTHESIA CERVICAL SPINE AND BRAIN  WITH AND WITHOUT CONTRAST;  Surgeon: Radiologist, Medication, MD;  Location: [facility] OR;  Service: Radiology;  Laterality: N/A;   TUBAL LIGATION Bilateral    VAGINAL  HYSTERECTOMY  03/2006   Patient Active Problem List   Diagnosis Date Noted   Hypokalemia 07/29/2022   Weakness of both lower extremities 07/29/2022   Abscess of left groin 11/15/2021   Abnormal LFTs 11/15/2021   Acute respiratory disease due to COVID-19 virus 11/21/2020   Weakness    Hypoalbuminemia due to protein-calorie malnutrition [facility])    Neurogenic bowel    Neurogenic bladder    Labile blood pressure    Neuropathic pain    Abscess of female pelvis    SVT (supraventricular tachycardia) (HCC)    Radial styloid tenosynovitis 03/12/2018   Wheelchair dependence 02/27/2018   Localized osteoporosis with current pathological fracture with routine healing 01/19/2017   Wrist fracture 01/16/2017   Sprain of ankle 03/23/2016   Closed fracture of lateral malleolus 03/16/2016   Health care maintenance 01/24/2016   Blood pressure elevated without history of HTN 10/25/2015   Essential hypertension 10/25/2015   Multiple sclerosis (HCC) 10/02/2015   Chronic left shoulder pain 07/19/2015   Multiple sclerosis exacerbation (HCC) 07/14/2015   MS (multiple sclerosis) (HCC) 11/26/2014   Increased body mass index 11/26/2014   HPV test positive 11/26/2014   Status post laparoscopic supracervical hysterectomy 11/26/2014   Galactorrhea 11/26/2014   Back ache 05/21/2014   Adiposity 05/21/2014   Disordered sleep 05/21/2014   Muscle spasticity 05/21/2014   Spasticity 05/21/2014   Calculus of kidney 12/09/2013   Renal colic 12/09/2013   Hypercholesteremia  08/19/2013   Hereditary and idiopathic neuropathy 08/19/2013   Hypercholesterolemia without hypertriglyceridemia 08/19/2013   Bladder infection, chronic 07/25/2012   Disorder of bladder function 07/25/2012   Incomplete bladder emptying 07/25/2012   Microscopic hematuria 07/25/2012   Right upper quadrant pain 07/25/2012    ONSET DATE: 1995  REFERRING DIAG: MS, weakness, other closed fracture of proximal end of R tibia  THERAPY DIAG:  No  diagnosis found.  Rationale for Evaluation and Treatment: Rehabilitation  SUBJECTIVE:                                                                                                                                                                                             SUBJECTIVE STATEMENT:  ***  Pt accompanied by: self  PERTINENT HISTORY: Patient is returning to PT s/p ORIF of R tibia shaft fracture 09/23/2022. Patient has weakness in BLE with RLE>LLE. She drives with hand controls. Patient has been diagnosed with MS in 1995. PMH includes: back pain, CBP, chronic L shoulder pain, galactorrhea, neuropathy, HPV, hypercholestermeia, HTN, MS, osteopenia, PONV, wrist fracture. Additional order for other closed fracture of proximal end of R tibia with routine healing. Still has to use [employer].   PAIN:  Are you having pain? No  PRECAUTIONS: Fall  RED FLAGS: None   WEIGHT BEARING RESTRICTIONS: No  FALLS: Has patient fallen in last 6 months? Yes. Number of falls 1  LIVING ENVIRONMENT: Lives with: lives with their family Lives in: House/apartment Stairs:  ramp from garage into house Has following equipment at home: Dan Humphreys - 2 wheeled, shower chair, Grab bars, and Ramped entry  PLOF: Independent with household mobility with device  PATIENT GOALS: get up and pivot and turn, sit to stands. Return to walk, go to bathroom on her own   OBJECTIVE:  Note: Objective measures were completed at Evaluation unless otherwise noted.  DIAGNOSTIC FINDINGS: MRI of the brain 03/18/2020 showed multiple T2/FLAIR hyperintense foci in the periventricular, juxtacortical and deep white matter.  There were no infratentorial lesions noted.  None of the foci enhanced.   Due to severe claustrophobia, the study was done with conscious sedation in the hospital   COGNITION: Overall cognitive status: Within functional limits for tasks assessed   SENSATION: Lack of sensation in feet, loss in bilateral lateral  aspect of knee  COORDINATION: Does not have the strength for functional LE heel slide test   MUSCLE TONE: n/a   POSTURE: rounded shoulders, posterior pelvic tilt, and flexed trunk   LOWER EXTREMITY ROM:     WFL grossly  LOWER EXTREMITY MMT:    MMT Right Eval Left Eval  Hip flexion 2- 2  Hip extension    Hip abduction 3+ 3+  Hip adduction 3 3  Hip internal rotation    Hip external rotation    Knee flexion 2 2+  Knee extension 2- 2  Ankle dorsiflexion 0 0  Ankle plantarflexion 0 0  Ankle inversion    Ankle eversion    (Blank rows = not tested)  BED MOBILITY:  Assess future session  TRANSFERS: Assistive device utilized: Environmental consultant - 2 wheeled  Sit to stand: CGA Stand to sit: CGA  RAMP:  Assess at future date   GAIT: Assess next session  FUNCTIONAL TESTS:  5 times sit to stand: unable to complete 2 consecutive sit to stands  10 meter walk test: perform in future session   PATIENT SURVEYS:  FOTO 34  TODAY'S TREATMENT: DATE: 02/14/23  Unless otherwise stated, all tasks performed below in // bars with gait belt applied, +1 CGA and +1 for wheelchair follow.  Neuro: Sit to Stands x multiple trials with upper extremity support on parallel bars (throughout session)   Static standing weight shifts in parallel bars, 15 sec bouts x2; cues for shift to the RLE -last one hold for prolonged time  Standing hip flexion LLE in parallel bars x5, patient unable to completely lift foot off the floor   Standing hip flexion LLE in parallel bars x5, with sock assist on LE to decrease friction ; one rest break mid therapy intervention   Ambulation in parallel bars with wheelchair follow, x1 length of parallel bars, deferred further mobility in bars due to tremors in LLE   Assisted knee flexion in seated with sliding plate and PT assist x10 each LE   Pt requires significant therapeutic rest breaks throughout the session due to fatigue.   PATIENT EDUCATION: Education  details: goals, POC Person educated: Patient Education method: Explanation, Demonstration, Tactile cues, and Verbal cues Education comprehension: verbalized understanding, returned demonstration, verbal cues required, and tactile cues required  HOME EXERCISE PROGRAM:  To be given at next visit.   GOALS: Goals reviewed with patient? Yes  SHORT TERM GOALS: Target date: 03/06/2023  Patient will be independent in home exercise program to improve strength/mobility for better functional independence with ADLs.  Baseline: 10/1: give next session  Goal status: INITIAL   LONG TERM GOALS: Target date: 05/01/2023  Patient will increase FOTO score to equal to or greater than 48 to demonstrate statistically significant improvement in mobility and quality of life  Baseline: 10/1: 34%  Goal status: INITIAL  2.  Patient (> 72 years old) will complete five times sit to stand test in < 15 seconds indicating an increased LE strength and improved balance.  Baseline: 10/1; unable to tolerate two consecutive sit to stands Goal status: INITIAL  3.  Patient will increase 10 meter walk test to >0.5 m/s as to improve gait speed for better community ambulation and to reduce fall risk.  Baseline: 10/1: not tested this session  Goal status: INITIAL  4.  Patient will transfer independently at home and no longer require use of [employer] Plus Lift for increased independence.  Baseline: 10/1: reliant upon lift  Goal status: INITIAL  Goal status: INITIAL  ASSESSMENT:  CLINICAL IMPRESSION:  ***Pt will continue to benefit from skilled therapy to address remaining deficits in order to improve overall QoL and return to PLOF.      OBJECTIVE IMPAIRMENTS: Abnormal gait, decreased activity tolerance, decreased balance, decreased coordination, decreased endurance, decreased mobility, difficulty walking, decreased strength, impaired  perceived functional ability, improper body mechanics, and postural dysfunction.    ACTIVITY LIMITATIONS: carrying, lifting, bending, standing, squatting, sleeping, transfers, bed mobility, bathing, toileting, dressing, reach over head, hygiene/grooming, locomotion level, and caring for others  PARTICIPATION LIMITATIONS: meal prep, cleaning, laundry, driving, shopping, community activity, yard work, and church  PERSONAL FACTORS: back pain, CBP, chronic L shoulder pain, galactorrhea, neuropathy, HPV, hypercholestermeia, HTN, MS, osteopenia, PONV, wrist fracture are also affecting patient's functional outcome.   REHAB POTENTIAL: Good  CLINICAL DECISION MAKING: Evolving/moderate complexity  EVALUATION COMPLEXITY: Moderate  PLAN:  PT FREQUENCY: 2x/week  PT DURATION: 12 weeks  PLANNED INTERVENTIONS: Therapeutic exercises, Therapeutic activity, Neuromuscular re-education, Balance training, Gait training, Patient/Family education, Self Care, Joint mobilization, Stair training, Vestibular training, Canalith repositioning, Orthotic/Fit training, DME instructions, Dry Needling, Electrical stimulation, Spinal mobilization, Cryotherapy, Moist heat, Compression bandaging, scar mobilization, Taping, Traction, Ultrasound, Manual therapy, and Re-evaluation  PLAN FOR NEXT SESSION:  Continue with standing weight shift in // bars, walking in // bars, and bed mobility if time, HEP      Precious Bard, PT, DPT Physical Therapist - [facility]  Outpatient Physical Therapy- Main Campus [phone]

## 2023-02-15 ENCOUNTER — Ambulatory Visit: Payer: PPO

## 2023-02-15 DIAGNOSIS — R2681 Unsteadiness on feet: Secondary | ICD-10-CM

## 2023-02-15 DIAGNOSIS — R262 Difficulty in walking, not elsewhere classified: Secondary | ICD-10-CM

## 2023-02-15 DIAGNOSIS — M6281 Muscle weakness (generalized): Secondary | ICD-10-CM | POA: Diagnosis not present

## 2023-02-19 NOTE — Therapy (Signed)
OUTPATIENT PHYSICAL THERAPY NEURO TREATMENT   Patient Name: Lisa Williams MRN: [id number] DOB:02-Feb-1961, 62 y.o., female Today's Date: 02/20/2023   PCP: Lauro Regulus MD REFERRING PROVIDER: Lauro Regulus MD  END OF SESSION:  PT End of Session - 02/20/23 0957     Visit Number 5    Number of Visits 24    Date for PT Re-Evaluation 05/01/23    PT Start Time 1015    PT Stop Time 1059    PT Time Calculation (min) 44 min    Equipment Utilized During Treatment Gait belt    Activity Tolerance Patient tolerated treatment well    Behavior During Therapy [facility] for tasks assessed/performed                 Past Medical History:  Diagnosis Date   Abdominal pain, right upper quadrant    Back pain    Calculus of kidney 12/09/2013   Chronic back pain    unspecified   Chronic left shoulder pain 07/19/2015   Complication of anesthesia    Functional disorder of bladder    other   Galactorrhea 11/26/2014   Chronic    Hereditary and idiopathic neuropathy 08/19/2013   History of kidney stones    HPV test positive    Hypercholesteremia 08/19/2013   Hypertension    Incomplete bladder emptying    Microscopic hematuria    MS (multiple sclerosis) (HCC)    Muscle spasticity 05/21/2014   Nonspecific findings on examination of urine    other   Osteopenia    PONV (postoperative nausea and vomiting)    Status post laparoscopic supracervical hysterectomy 11/26/2014   Tobacco user 11/26/2014   Wrist fracture    Past Surgical History:  Procedure Laterality Date   bilateral tubal ligation  1996   BREAST CYST EXCISION Left 2002   CYST EXCISION Left 05/10/2022   Procedure: CYST REMOVAL;  Surgeon: Carolan Shiver, MD;  Location: [facility] ORS;  Service: General;  Laterality: Left;   FRACTURE SURGERY     KNEE SURGERY     right   LAPAROSCOPIC SUPRACERVICAL HYSTERECTOMY  08/05/2013   ORIF WRIST FRACTURE Left 01/17/2017   Procedure: OPEN REDUCTION INTERNAL FIXATION  (ORIF) WRIST FRACTURE;  Surgeon: Lyndle Herrlich, MD;  Location: [facility] OR[facility];  Service: Orthopedics;  Laterality: Left;   RADIOLOGY WITH ANESTHESIA N/A 03/18/2020   Procedure: MRI WITH ANESTHESIA CERVICAL SPINE AND BRAIN  WITH AND WITHOUT CONTRAST;  Surgeon: Radiologist, Medication, MD;  Location: [facility] OR;  Service: Radiology;  Laterality: N/A;   TUBAL LIGATION Bilateral    VAGINAL HYSTERECTOMY  03/2006   Patient Active Problem List   Diagnosis Date Noted   Hypokalemia 07/29/2022   Weakness of both lower extremities 07/29/2022   Abscess of left groin 11/15/2021   Abnormal LFTs 11/15/2021   Acute respiratory disease due to COVID-19 virus 11/21/2020   Weakness    Hypoalbuminemia due to protein-calorie malnutrition [facility])    Neurogenic bowel    Neurogenic bladder    Labile blood pressure    Neuropathic pain    Abscess of female pelvis    SVT (supraventricular tachycardia) (HCC)    Radial styloid tenosynovitis 03/12/2018   Wheelchair dependence 02/27/2018   Localized osteoporosis with current pathological fracture with routine healing 01/19/2017   Wrist fracture 01/16/2017   Sprain of ankle 03/23/2016   Closed fracture of lateral malleolus 03/16/2016   Health care maintenance 01/24/2016   Blood pressure elevated without history of HTN 10/25/2015  Essential hypertension 10/25/2015   Multiple sclerosis (HCC) 10/02/2015   Chronic left shoulder pain 07/19/2015   Multiple sclerosis exacerbation (HCC) 07/14/2015   MS (multiple sclerosis) (HCC) 11/26/2014   Increased body mass index 11/26/2014   HPV test positive 11/26/2014   Status post laparoscopic supracervical hysterectomy 11/26/2014   Galactorrhea 11/26/2014   Back ache 05/21/2014   Adiposity 05/21/2014   Disordered sleep 05/21/2014   Muscle spasticity 05/21/2014   Spasticity 05/21/2014   Calculus of kidney 12/09/2013   Renal colic 12/09/2013   Hypercholesteremia 08/19/2013   Hereditary and idiopathic neuropathy 08/19/2013    Hypercholesterolemia without hypertriglyceridemia 08/19/2013   Bladder infection, chronic 07/25/2012   Disorder of bladder function 07/25/2012   Incomplete bladder emptying 07/25/2012   Microscopic hematuria 07/25/2012   Right upper quadrant pain 07/25/2012    ONSET DATE: 1995  REFERRING DIAG: MS, weakness, other closed fracture of proximal end of R tibia  THERAPY DIAG:  Muscle weakness (generalized)  Difficulty in walking, not elsewhere classified  Unsteadiness on feet  Rationale for Evaluation and Treatment: Rehabilitation  SUBJECTIVE:                                                                                                                                                                                             SUBJECTIVE STATEMENT:  Patient is eager to try LiteGait once student is here for second pair of hands.   Pt accompanied by: self  PERTINENT HISTORY: Patient is returning to PT s/p ORIF of R tibia shaft fracture 09/23/2022. Patient has weakness in BLE with RLE>LLE. She drives with hand controls. Patient has been diagnosed with MS in 1995. PMH includes: back pain, CBP, chronic L shoulder pain, galactorrhea, neuropathy, HPV, hypercholestermeia, HTN, MS, osteopenia, PONV, wrist fracture. Additional order for other closed fracture of proximal end of R tibia with routine healing. Still has to use [employer].   PAIN:  Are you having pain? No  PRECAUTIONS: Fall  RED FLAGS: None   WEIGHT BEARING RESTRICTIONS: No  FALLS: Has patient fallen in last 6 months? Yes. Number of falls 1  LIVING ENVIRONMENT: Lives with: lives with their family Lives in: House/apartment Stairs:  ramp from garage into house Has following equipment at home: Dan Humphreys - 2 wheeled, shower chair, Grab bars, and Ramped entry  PLOF: Independent with household mobility with device  PATIENT GOALS: get up and pivot and turn, sit to stands. Return to walk, go to bathroom on her own   OBJECTIVE:   Note: Objective measures were completed at Evaluation unless otherwise noted.  DIAGNOSTIC FINDINGS: MRI of the brain 03/18/2020 showed multiple T2/FLAIR hyperintense  foci in the periventricular, juxtacortical and deep white matter.  There were no infratentorial lesions noted.  None of the foci enhanced.   Due to severe claustrophobia, the study was done with conscious sedation in the hospital   COGNITION: Overall cognitive status: Within functional limits for tasks assessed   SENSATION: Lack of sensation in feet, loss in bilateral lateral aspect of knee  COORDINATION: Does not have the strength for functional LE heel slide test   MUSCLE TONE: n/a   POSTURE: rounded shoulders, posterior pelvic tilt, and flexed trunk   LOWER EXTREMITY ROM:     WFL grossly  LOWER EXTREMITY MMT:    MMT Right Eval Left Eval  Hip flexion 2- 2  Hip extension    Hip abduction 3+ 3+  Hip adduction 3 3  Hip internal rotation    Hip external rotation    Knee flexion 2 2+  Knee extension 2- 2  Ankle dorsiflexion 0 0  Ankle plantarflexion 0 0  Ankle inversion    Ankle eversion    (Blank rows = not tested)  BED MOBILITY:  Assess future session  TRANSFERS: Assistive device utilized: Environmental consultant - 2 wheeled  Sit to stand: CGA Stand to sit: CGA  RAMP:  Assess at future date   GAIT: Assess next session  FUNCTIONAL TESTS:  5 times sit to stand: unable to complete 2 consecutive sit to stands  10 meter walk test: perform in future session   PATIENT SURVEYS:  FOTO 34  TODAY'S TREATMENT: DATE: 02/20/23  Unless otherwise stated, all tasks performed below in // bars with gait belt applied,   Sit to Stands x multiple trials with upper extremity support on parallel bars (throughout session)   Static standing weight shifts in parallel bars, 15 sec bouts x2; cues for shift to the RLE -last one hold for prolonged time  Ambulate forward in // bars with wheelchair follow; 3 sets; sock on L foot,  no brace on R knee, stabilization to R knee provided with stance phase.   Seated: Dynadisc heel presses 10x 5 second holds each LE Dynadisc toe presses 10x 5 second holds each LE RTB abduction 12x RTB adduction 10x each LE  Pt requires significant therapeutic rest breaks throughout the session due to fatigue.   PATIENT EDUCATION: Education details: goals, POC Person educated: Patient Education method: Explanation, Demonstration, Tactile cues, and Verbal cues Education comprehension: verbalized understanding, returned demonstration, verbal cues required, and tactile cues required  HOME EXERCISE PROGRAM:  To be given at next visit.   GOALS: Goals reviewed with patient? Yes  SHORT TERM GOALS: Target date: 03/06/2023  Patient will be independent in home exercise program to improve strength/mobility for better functional independence with ADLs.  Baseline: 10/1: give next session  Goal status: INITIAL   LONG TERM GOALS: Target date: 05/01/2023  Patient will increase FOTO score to equal to or greater than 48 to demonstrate statistically significant improvement in mobility and quality of life  Baseline: 10/1: 34%  Goal status: INITIAL  2.  Patient (> 64 years old) will complete five times sit to stand test in < 15 seconds indicating an increased LE strength and improved balance.  Baseline: 10/1; unable to tolerate two consecutive sit to stands Goal status: INITIAL  3.  Patient will increase 10 meter walk test to >0.5 m/s as to improve gait speed for better community ambulation and to reduce fall risk.  Baseline: 10/1: not tested this session  Goal status: INITIAL  4.  Patient  will transfer independently at home and no longer require use of Sara Plus Lift for increased independence.  Baseline: 10/1: reliant upon lift  Goal status: INITIAL  Goal status: INITIAL  ASSESSMENT:  CLINICAL IMPRESSION: :Patient has improved weight shift with ambulation this session. She  tolerates progressive stabilization and standing this session. Will attempt standing with LiteGAit next session pending amount of assistance available at time of appointment. . Pt will continue to benefit from skilled therapy to address remaining deficits in order to improve overall QoL and return to PLOF.      OBJECTIVE IMPAIRMENTS: Abnormal gait, decreased activity tolerance, decreased balance, decreased coordination, decreased endurance, decreased mobility, difficulty walking, decreased strength, impaired perceived functional ability, improper body mechanics, and postural dysfunction.   ACTIVITY LIMITATIONS: carrying, lifting, bending, standing, squatting, sleeping, transfers, bed mobility, bathing, toileting, dressing, reach over head, hygiene/grooming, locomotion level, and caring for others  PARTICIPATION LIMITATIONS: meal prep, cleaning, laundry, driving, shopping, community activity, yard work, and church  PERSONAL FACTORS: back pain, CBP, chronic L shoulder pain, galactorrhea, neuropathy, HPV, hypercholestermeia, HTN, MS, osteopenia, PONV, wrist fracture are also affecting patient's functional outcome.   REHAB POTENTIAL: Good  CLINICAL DECISION MAKING: Evolving/moderate complexity  EVALUATION COMPLEXITY: Moderate  PLAN:  PT FREQUENCY: 2x/week  PT DURATION: 12 weeks  PLANNED INTERVENTIONS: Therapeutic exercises, Therapeutic activity, Neuromuscular re-education, Balance training, Gait training, Patient/Family education, Self Care, Joint mobilization, Stair training, Vestibular training, Canalith repositioning, Orthotic/Fit training, DME instructions, Dry Needling, Electrical stimulation, Spinal mobilization, Cryotherapy, Moist heat, Compression bandaging, scar mobilization, Taping, Traction, Ultrasound, Manual therapy, and Re-evaluation  PLAN FOR NEXT SESSION:  Continue with standing weight shift in // bars, walking in // bars, and bed mobility if time, HEP Lite Gait?        Precious Bard, PT, DPT Physical Therapist - [facility]  Outpatient Physical Therapy- Main Campus [phone]

## 2023-02-20 ENCOUNTER — Ambulatory Visit: Payer: PPO

## 2023-02-20 DIAGNOSIS — M6281 Muscle weakness (generalized): Secondary | ICD-10-CM | POA: Diagnosis not present

## 2023-02-20 DIAGNOSIS — R2681 Unsteadiness on feet: Secondary | ICD-10-CM

## 2023-02-20 DIAGNOSIS — R262 Difficulty in walking, not elsewhere classified: Secondary | ICD-10-CM

## 2023-02-22 ENCOUNTER — Encounter: Payer: Self-pay | Admitting: Neurology

## 2023-02-22 ENCOUNTER — Ambulatory Visit (INDEPENDENT_AMBULATORY_CARE_PROVIDER_SITE_OTHER): Payer: PPO | Admitting: Neurology

## 2023-02-22 VITALS — BP 119/79 | HR 57 | Ht 71.0 in | Wt 263.0 lb

## 2023-02-22 DIAGNOSIS — R3915 Urgency of urination: Secondary | ICD-10-CM | POA: Diagnosis not present

## 2023-02-22 DIAGNOSIS — G35 Multiple sclerosis: Secondary | ICD-10-CM | POA: Diagnosis not present

## 2023-02-22 DIAGNOSIS — G35D Multiple sclerosis, unspecified: Secondary | ICD-10-CM

## 2023-02-22 DIAGNOSIS — R269 Unspecified abnormalities of gait and mobility: Secondary | ICD-10-CM

## 2023-02-22 DIAGNOSIS — R5383 Other fatigue: Secondary | ICD-10-CM

## 2023-02-22 DIAGNOSIS — R29898 Other symptoms and signs involving the musculoskeletal system: Secondary | ICD-10-CM

## 2023-02-22 DIAGNOSIS — R799 Abnormal finding of blood chemistry, unspecified: Secondary | ICD-10-CM | POA: Diagnosis not present

## 2023-02-22 DIAGNOSIS — Z79899 Other long term (current) drug therapy: Secondary | ICD-10-CM

## 2023-02-22 MED ORDER — GABAPENTIN 100 MG PO CAPS: 100.0000 mg | ORAL_CAPSULE | Freq: Two times a day (BID) | ORAL | 3 refills | Status: DC

## 2023-02-22 MED ORDER — PHENTERMINE HCL 37.5 MG PO CAPS
37.5000 mg | ORAL_CAPSULE | ORAL | 5 refills | Status: DC
Start: 2023-02-22 — End: 2023-09-05

## 2023-02-22 NOTE — Progress Notes (Addendum)
[facility]  PATIENT: Lisa Williams DOB: Feb 08, 1961  REFERRING DOCTOR OR PCP: Einar Crow, MD (PCP) SOURCE: Patient, notes from Dr. Anne Hahn, imaging and lab reports, MRI images personally reviewed and summarized below.  _________________________________   HISTORICAL  CHIEF COMPLAINT:  Chief Complaint  Patient presents with   Follow-up    Pt in room 11. Here for MS follow up on Mayzent. Pt fell at home in May now has rods in right leg. Pt said MS standpoint doing well. Had a MS exacerbation in March. Pt needs refill on gabapentin,tizanidine,baclofen.     HISTORY OF PRESENT ILLNESS:  Lisa Williams is a 62 y.o. woman with multiple sclerosis.   She is on Mayzent and tolerates it well.  Update 02/22/2023 She fell in the bathtub earlier this year and broke her right leg, requiring surgery.    The leg gave out on her.    She is doing better but not at baseline.  She does PT.   She can only go 15 fet with a walker now and was 60-100 feet before.  She has weakness in both legs, R worse than L.   Her arms are strong.   She drives with hand controls x 10 years.     She has muscle spasms on the right, mostly in the leg.  Tizanidine and baclofen have helped. She takes 4 mg po tid  .She opted not to try dantrolene.  She denies sensory symptoms now but had some   She has urinary urgency helped by oxybutynin.   No recent UTI.  Vision is doing well.  She has never had neuritis.     She has fatigue, worse with heat.  She sleeps well most nights.   Mood and cognition are fine.      MS History: She was diagnosed in 1995 after presenting with right leg weakness and foot drop.   One day, she lost all sensation in her legs.   She saw Dr. Chestine Spore in [place] until his retirement.   She then saw Dr. [name] in [place] and she started to see Dr. Anne Hahn 2 years ago.     She was initially placed on Copaxone x 3 years and then was off all DMTs for many years and then  started Rituxan around 2018 to 2019.  At that time, associated with a UTI, she felt much more weak prompting her to go on a medication.   She started Mayzent since 2020.   She has tolerated it well.      IMAGING: MRI of the brain 03/18/2020 showed multiple T2/FLAIR hyperintense foci in the periventricular, juxtacortical and deep white matter.  There were no infratentorial lesions noted.  None of the foci enhanced.   Due to severe claustrophobia, the study was done with conscious sedation in the hospital.    MRI of the cervical spine 03/18/2020 showed T2 hyperintense foci at C1-C2, C2-C3, C4-C5, C5-C6 and T1.  There is a disc protrusion at C5-C6 to the left and a larger disc protrusion at C6-C7 to the right that distorts the thecal sac.  The adjacent spinal cord has normal signal.  There could be compression of the right C7 nerve root.     REVIEW OF SYSTEMS: Constitutional: No fevers, chills, sweats, or change in appetite Eyes: No visual changes, double vision, eye pain Ear, nose and throat: No hearing loss, ear pain, nasal congestion, sore throat Cardiovascular: No chest pain, palpitations Respiratory:  No shortness of breath at rest or  with exertion.   No wheezes GastrointestinaI: No nausea, vomiting, diarrhea, abdominal pain, fecal incontinence Genitourinary:  No dysuria, urinary retention or frequency.  No nocturia. Musculoskeletal:  No neck pain, back pain Integumentary: No rash, pruritus, skin lesions Neurological: as above Psychiatric: No depression at this time.  No anxiety Endocrine: No palpitations, diaphoresis, change in appetite, change in weigh or increased thirst Hematologic/Lymphatic:  No anemia, purpura, petechiae. Allergic/Immunologic: No itchy/runny eyes, nasal congestion, recent allergic reactions, rashes  ALLERGIES: Allergies  Allergen Reactions   Amlodipine Swelling   Betadine [Povidone-Iodine]    Povidone Iodine Rash    HOME MEDICATIONS:  Current Outpatient  Medications:    acetaminophen (TYLENOL) 500 MG tablet, Take 500-1,000 mg by mouth every 6 (six) hours as needed for moderate pain or headache., Disp: , Rfl:    baclofen (LIORESAL) 20 MG tablet, Take 1 tablet (20 mg total) by mouth 3 (three) times daily., Disp: 270 each, Rfl: 3   Cholecalciferol 25 MCG (1000 UT) tablet, Take 5,000 Units by mouth daily., Disp: , Rfl:    gabapentin (NEURONTIN) 100 MG capsule, Take 1 capsule (100 mg total) by mouth 2 (two) times daily., Disp: 180 capsule, Rfl: 3   oxybutynin (DITROPAN-XL) 10 MG 24 hr tablet, Take 1 tablet (10 mg total) by mouth daily., Disp: 90 tablet, Rfl: 3   phentermine 37.5 MG capsule, Take 1 capsule (37.5 mg total) by mouth every morning., Disp: 30 capsule, Rfl: 5   Siponimod Fumarate (MAYZENT) 2 MG TABS, Take 2 mg by mouth daily., Disp: 30 tablet, Rfl: 11   telmisartan (MICARDIS) 40 MG tablet, Take 40 mg by mouth daily., Disp: , Rfl:    tiZANidine (ZANAFLEX) 4 MG tablet, One po qAM, one po qPM and 2 po qHS, Disp: 120 tablet, Rfl: 1   methylPREDNISolone (MEDROL DOSEPAK) 4 MG TBPK tablet, Take as directed (Patient not taking: Reported on 02/22/2023), Disp: 21 tablet, Rfl: 0   Multiple Vitamin (MULTIVITAMIN WITH MINERALS) TABS tablet, Take 1 tablet by mouth daily. (Patient not taking: Reported on 02/22/2023), Disp: , Rfl:    predniSONE (DELTASONE) 50 MG tablet, Take 5 tablets (250 mg total) by mouth every 4 (four) hours. (Patient not taking: Reported on 02/22/2023), Disp: 20 tablet, Rfl: 0  PAST MEDICAL HISTORY: Past Medical History:  Diagnosis Date   Abdominal pain, right upper quadrant    Back pain    Calculus of kidney 12/09/2013   Chronic back pain    unspecified   Chronic left shoulder pain 07/19/2015   Complication of anesthesia    Functional disorder of bladder    other   Galactorrhea 11/26/2014   Chronic    Hereditary and idiopathic neuropathy 08/19/2013   History of kidney stones    HPV test positive    Hypercholesteremia  08/19/2013   Hypertension    Incomplete bladder emptying    Microscopic hematuria    MS (multiple sclerosis) (HCC)    Muscle spasticity 05/21/2014   Nonspecific findings on examination of urine    other   Osteopenia    PONV (postoperative nausea and vomiting)    Status post laparoscopic supracervical hysterectomy 11/26/2014   Tobacco user 11/26/2014   Wrist fracture     PAST SURGICAL HISTORY: Past Surgical History:  Procedure Laterality Date   bilateral tubal ligation  1996   BREAST CYST EXCISION Left 2002   CYST EXCISION Left 05/10/2022   Procedure: CYST REMOVAL;  Surgeon: Carolan Shiver, MD;  Location: [facility] OR[facility];  Service: General;  Laterality: Left;  FRACTURE SURGERY     KNEE SURGERY     right   LAPAROSCOPIC SUPRACERVICAL HYSTERECTOMY  08/05/2013   ORIF WRIST FRACTURE Left 01/17/2017   Procedure: OPEN REDUCTION INTERNAL FIXATION (ORIF) WRIST FRACTURE;  Surgeon: Lyndle Herrlich, MD;  Location: [facility] OR[facility];  Service: Orthopedics;  Laterality: Left;   RADIOLOGY WITH ANESTHESIA N/A 03/18/2020   Procedure: MRI WITH ANESTHESIA CERVICAL SPINE AND BRAIN  WITH AND WITHOUT CONTRAST;  Surgeon: Radiologist, Medication, MD;  Location: [facility] OR;  Service: Radiology;  Laterality: N/A;   TUBAL LIGATION Bilateral    VAGINAL HYSTERECTOMY  03/2006    FAMILY HISTORY: Family History  Problem Relation Age of Onset   Sickle cell trait Sister    Hypertension Sister    Supraventricular tachycardia Sister    Hypertension Sister    Diabetes Maternal Grandmother    Liver cancer Maternal Grandmother    Diabetes Maternal Aunt    Breast cancer Maternal Aunt        great MAT   Ovarian cancer Paternal Grandmother    Nephrolithiasis Father    Hypertension Father    Prostate cancer Father    Kidney disease Father    Heart failure Father    Hypertension Sister    Osteoarthritis Mother    Hypertension Mother    Hyperlipidemia Mother    GU problems Neg Hx    Urolithiasis Neg Hx     SOCIAL  HISTORY:  Social History   Socioeconomic History   Marital status: Single    Spouse name: Not on file   Number of children: 0   Years of education: 14   Highest education level: Not on file  Occupational History   Not on file  Tobacco Use   Smoking status: Former    Current packs/day: 0.50    Types: Cigarettes   Smokeless tobacco: Never  Vaping Use   Vaping status: Never Used  Substance and Sexual Activity   Alcohol use: No    Alcohol/week: 0.0 standard drinks of alcohol   Drug use: No   Sexual activity: Not Currently    Birth control/protection: Surgical  Other Topics Concern   Not on file  Social History Narrative   Lives at home with mom and sister,. Has a walker for ambulation/uses WC when necessary   Right Handed   Drinks no caffeine   Social Determinants of Health   Financial Resource Strain: Low Risk  (01/18/2023)   Received from [facility]   Overall Financial Resource Strain (CARDIA)    Difficulty of Paying Living Expenses: Not hard at all  Food Insecurity: No Food Insecurity (01/18/2023)   Received from [facility]   Hunger Vital Sign    Worried About Running Out of Food in the Last Year: Never true    Ran Out of Food in the Last Year: Never true  Transportation Needs: No Transportation Needs (01/18/2023)   Received from [facility]    In the past 12 months, has lack of transportation kept you from medical appointments or from getting medications?: No    Lack of Transportation (Non-Medical): No  Physical Activity: Not on file  Stress: Not on file  Social Connections: Not on file  Intimate Partner Violence: Not At Risk (07/29/2022)   Humiliation, Afraid, Rape, and Kick questionnaire    Fear of Current or Ex-Partner: No    Emotionally Abused: No    Physically Abused: No    Sexually  Abused: No     PHYSICAL EXAM  Vitals:   02/22/23 1111  BP: 119/79  Pulse: (!) 57   Weight: 263 lb (119.3 kg)  Height: 5\' 11"  (1.803 m)    Body mass index is 36.68 kg/m.   General: The patient is well-developed and well-nourished and in no acute distress  HEENT:  Head is [place]/AT.  Sclera are anicteric.    Skin: Extremities are without rash or  edema.  Musculoskeletal:  Back is nontender  Neurologic Exam  Mental status: The patient is alert and oriented x 3 at the time of the examination. The patient has apparent normal recent and remote memory, with an apparently normal attention span and concentration ability.   Speech is normal.  Cranial nerves: Extraocular movements are full. Facial strength and sensation are normal.  No obvious hearing deficits are noted.  Motor:  Muscle bulk is normal.   Tone is increased in the legs, right slightly more than left but better than last visit.  Strength was 2/5 in the hip flexors and 43 to 4-/5 in the knee extensors, right slightly weaker than left.  Knee flexors were 4-/5, the distal legs are 3-4/5.  Strength was 5/5 in the arms except 4+/5 left ulnar innervated hand muscles  Sensory: Sensory testing showed mildly reduced touch and vibration sensation in the right compared to the left..  Coordination: Cerebellar testing reveals good finger-nose-finger.  Cannot do Heel-to-shin.  Gait and station: Due to weakness, gait not tested  Reflexes: Deep tendon reflexes are symmetric and normal bilaterally.       DIAGNOSTIC DATA (LABS, IMAGING, TESTING) - I reviewed patient records, labs, notes, testing and imaging myself where available.  Lab Results  Component Value Date   WBC 12.4 (H) 09/22/2022   HGB 13.7 09/22/2022   HCT 39.8 09/22/2022   MCV 77.9 (L) 09/22/2022   PLT 232 09/22/2022      Component Value Date/Time   NA 141 09/22/2022 0948   NA 143 03/03/2021 1128   NA 139 02/15/2013 1700   K 4.5 09/22/2022 0948   K 3.8 02/15/2013 1700   CL 107 09/22/2022 0948   CL 109 (H) 02/15/2013 1700   CO2 25 09/22/2022 0948    CO2 25 02/15/2013 1700   GLUCOSE 95 09/22/2022 0948   GLUCOSE 94 02/15/2013 1700   BUN 28 (H) 09/22/2022 0948   BUN 21 03/03/2021 1128   BUN 20 (H) 02/15/2013 1700   CREATININE 0.62 09/22/2022 0948   CREATININE 0.59 (L) 02/15/2013 1700   CALCIUM 9.2 09/22/2022 0948   CALCIUM 9.0 02/15/2013 1700   PROT 7.2 09/22/2022 0948   PROT 6.5 03/03/2021 1128   PROT 7.2 02/15/2013 1700   ALBUMIN 4.1 09/22/2022 0948   ALBUMIN 4.5 03/03/2021 1128   ALBUMIN 3.6 02/15/2013 1700   AST 35 09/22/2022 0948   AST 20 02/15/2013 1700   ALT 52 (H) 09/22/2022 0948   ALT 15 02/15/2013 1700   ALKPHOS 116 09/22/2022 0948   ALKPHOS 127 02/15/2013 1700   BILITOT 1.2 09/22/2022 0948   BILITOT 0.7 03/03/2021 1128   BILITOT 0.3 02/15/2013 1700   GFRNONAA >60 09/22/2022 0948   GFRNONAA >60 02/15/2013 1700   GFRAA 110 05/13/2020 1146   GFRAA >60 02/15/2013 1700   Lab Results  Component Value Date   CHOL 187 11/29/2016   HDL 55 11/29/2016   LDLCALC 119 (H) 11/29/2016   TRIG 64 11/29/2016   CHOLHDL 3.4 11/29/2016   Lab Results  Component  Value Date   HGBA1C 5.1 11/29/2016   No results found for: "VITAMINB12" Lab Results  Component Value Date   TSH 0.412 11/26/2020       ASSESSMENT AND PLAN  Multiple sclerosis (HCC) - Plan: CBC with Differential/Platelet  High risk medication use - Plan: CBC with Differential/Platelet  Abnormal blood cell count - Plan: CBC with Differential/Platelet  Urinary urgency  Gait disturbance  Weakness of both lower extremities  Other fatigue    She is on Mayzent therapy and tolerates it well.  We will check cbc/diff Continue baclofen and tizanidine for spasticity.  Gabapentin for dysesthesias. Stay active and exercise as tolerated. 4.   Continue PT to help with recovery from recent fracture/deconditioning.   5.   Phentermine for MS fatigue.  This may also help with weight loss which may help her recovery.   Return in 6 months or sooner if there are new or  worsening neurologic symptoms.   This visit is part of a comprehensive longitudinal care medical relationship regarding the patients primary diagnosis of MS and related concerns.   Revanth Neidig A. Epimenio Foot, MD, [facility] 02/22/2023, 11:51 AM Certified in Neurology, Clinical Neurophysiology, Sleep Medicine and Neuroimaging  [facility] [address], [address] [place], [place] [address] [phone]

## 2023-02-22 NOTE — Addendum Note (Signed)
Addended by: Despina Arias A on: 02/22/2023 11:59 AM   Modules accepted: Level of Service

## 2023-02-23 LAB — CBC WITH DIFFERENTIAL/PLATELET
Basophils Absolute: 0 10*3/uL (ref 0.0–0.2)
Basos: 0 %
EOS (ABSOLUTE): 0.1 10*3/uL (ref 0.0–0.4)
Eos: 2 %
Hematocrit: 40.8 % (ref 34.0–46.6)
Hemoglobin: 13 g/dL (ref 11.1–15.9)
Immature Grans (Abs): 0 10*3/uL (ref 0.0–0.1)
Immature Granulocytes: 0 %
Lymphocytes Absolute: 0.3 10*3/uL — ABNORMAL LOW (ref 0.7–3.1)
Lymphs: 6 %
MCH: 26.2 pg — ABNORMAL LOW (ref 26.6–33.0)
MCHC: 31.9 g/dL (ref 31.5–35.7)
MCV: 82 fL (ref 79–97)
Monocytes Absolute: 0.5 10*3/uL (ref 0.1–0.9)
Monocytes: 10 %
Neutrophils Absolute: 4.6 10*3/uL (ref 1.4–7.0)
Neutrophils: 82 %
Platelets: 243 10*3/uL (ref 150–450)
RBC: 4.97 x10E6/uL (ref 3.77–5.28)
RDW: 16.8 % — ABNORMAL HIGH (ref 11.7–15.4)
WBC: 5.5 10*3/uL (ref 3.4–10.8)

## 2023-02-26 NOTE — Therapy (Signed)
OUTPATIENT PHYSICAL THERAPY NEURO TREATMENT   Patient Name: Lisa Williams MRN: [id number] DOB:Feb 14, 1961, 62 y.o., female Today's Date: 02/27/2023   PCP: Lauro Regulus MD REFERRING PROVIDER: Lauro Regulus MD  END OF SESSION:  PT End of Session - 02/27/23 1119     Visit Number 6    Number of Visits 24    Date for PT Re-Evaluation 05/01/23    PT Start Time 1015    PT Stop Time 1059    PT Time Calculation (min) 44 min    Equipment Utilized During Treatment Gait belt    Activity Tolerance Patient tolerated treatment well    Behavior During Therapy [facility] for tasks assessed/performed                  Past Medical History:  Diagnosis Date   Abdominal pain, right upper quadrant    Back pain    Calculus of kidney 12/09/2013   Chronic back pain    unspecified   Chronic left shoulder pain 07/19/2015   Complication of anesthesia    Functional disorder of bladder    other   Galactorrhea 11/26/2014   Chronic    Hereditary and idiopathic neuropathy 08/19/2013   History of kidney stones    HPV test positive    Hypercholesteremia 08/19/2013   Hypertension    Incomplete bladder emptying    Microscopic hematuria    MS (multiple sclerosis) (HCC)    Muscle spasticity 05/21/2014   Nonspecific findings on examination of urine    other   Osteopenia    PONV (postoperative nausea and vomiting)    Status post laparoscopic supracervical hysterectomy 11/26/2014   Tobacco user 11/26/2014   Wrist fracture    Past Surgical History:  Procedure Laterality Date   bilateral tubal ligation  1996   BREAST CYST EXCISION Left 2002   CYST EXCISION Left 05/10/2022   Procedure: CYST REMOVAL;  Surgeon: Carolan Shiver, MD;  Location: [facility] ORS;  Service: General;  Laterality: Left;   FRACTURE SURGERY     KNEE SURGERY     right   LAPAROSCOPIC SUPRACERVICAL HYSTERECTOMY  08/05/2013   ORIF WRIST FRACTURE Left 01/17/2017   Procedure: OPEN REDUCTION INTERNAL  FIXATION (ORIF) WRIST FRACTURE;  Surgeon: Lyndle Herrlich, MD;  Location: [facility] ORS;  Service: Orthopedics;  Laterality: Left;   RADIOLOGY WITH ANESTHESIA N/A 03/18/2020   Procedure: MRI WITH ANESTHESIA CERVICAL SPINE AND BRAIN  WITH AND WITHOUT CONTRAST;  Surgeon: Radiologist, Medication, MD;  Location: [facility];  Service: Radiology;  Laterality: N/A;   TUBAL LIGATION Bilateral    VAGINAL HYSTERECTOMY  03/2006   Patient Active Problem List   Diagnosis Date Noted   Hypokalemia 07/29/2022   Weakness of both lower extremities 07/29/2022   Abscess of left groin 11/15/2021   Abnormal LFTs 11/15/2021   Acute respiratory disease due to COVID-19 virus 11/21/2020   Weakness    Hypoalbuminemia due to protein-calorie malnutrition [facility])    Neurogenic bowel    Neurogenic bladder    Labile blood pressure    Neuropathic pain    Abscess of female pelvis    SVT (supraventricular tachycardia) (HCC)    Radial styloid tenosynovitis 03/12/2018   Wheelchair dependence 02/27/2018   Localized osteoporosis with current pathological fracture with routine healing 01/19/2017   Wrist fracture 01/16/2017   Sprain of ankle 03/23/2016   Closed fracture of lateral malleolus 03/16/2016   Health care maintenance 01/24/2016   Blood pressure elevated without history of HTN 10/25/2015  Essential hypertension 10/25/2015   Multiple sclerosis (HCC) 10/02/2015   Chronic left shoulder pain 07/19/2015   Multiple sclerosis exacerbation (HCC) 07/14/2015   MS (multiple sclerosis) (HCC) 11/26/2014   Increased body mass index 11/26/2014   HPV test positive 11/26/2014   Status post laparoscopic supracervical hysterectomy 11/26/2014   Galactorrhea 11/26/2014   Back ache 05/21/2014   Adiposity 05/21/2014   Disordered sleep 05/21/2014   Muscle spasticity 05/21/2014   Spasticity 05/21/2014   Calculus of kidney 12/09/2013   Renal colic 12/09/2013   Hypercholesteremia 08/19/2013   Hereditary and idiopathic neuropathy 08/19/2013    Hypercholesterolemia without hypertriglyceridemia 08/19/2013   Bladder infection, chronic 07/25/2012   Disorder of bladder function 07/25/2012   Incomplete bladder emptying 07/25/2012   Microscopic hematuria 07/25/2012   Right upper quadrant pain 07/25/2012    ONSET DATE: 1995  REFERRING DIAG: MS, weakness, other closed fracture of proximal end of R tibia  THERAPY DIAG:  Muscle weakness (generalized)  Difficulty in walking, not elsewhere classified  Unsteadiness on feet  Abnormality of gait and mobility  Rationale for Evaluation and Treatment: Rehabilitation  SUBJECTIVE:                                                                                                                                                                                             SUBJECTIVE STATEMENT: Patient reports she is now on a new medication to help with fatigue.   Pt accompanied by: self  PERTINENT HISTORY: Patient is returning to PT s/p ORIF of R tibia shaft fracture 09/23/2022. Patient has weakness in BLE with RLE>LLE. She drives with hand controls. Patient has been diagnosed with MS in 1995. PMH includes: back pain, CBP, chronic L shoulder pain, galactorrhea, neuropathy, HPV, hypercholestermeia, HTN, MS, osteopenia, PONV, wrist fracture. Additional order for other closed fracture of proximal end of R tibia with routine healing. Still has to use [employer].   PAIN:  Are you having pain? No  PRECAUTIONS: Fall  RED FLAGS: None   WEIGHT BEARING RESTRICTIONS: No  FALLS: Has patient fallen in last 6 months? Yes. Number of falls 1  LIVING ENVIRONMENT: Lives with: lives with their family Lives in: House/apartment Stairs:  ramp from garage into house Has following equipment at home: Dan Humphreys - 2 wheeled, shower chair, Grab bars, and Ramped entry  PLOF: Independent with household mobility with device  PATIENT GOALS: get up and pivot and turn, sit to stands. Return to walk, go to bathroom on  her own   OBJECTIVE:  Note: Objective measures were completed at Evaluation unless otherwise noted.  DIAGNOSTIC FINDINGS: MRI of the brain 03/18/2020 showed  multiple T2/FLAIR hyperintense foci in the periventricular, juxtacortical and deep white matter.  There were no infratentorial lesions noted.  None of the foci enhanced.   Due to severe claustrophobia, the study was done with conscious sedation in the hospital   COGNITION: Overall cognitive status: Within functional limits for tasks assessed   SENSATION: Lack of sensation in feet, loss in bilateral lateral aspect of knee  COORDINATION: Does not have the strength for functional LE heel slide test   MUSCLE TONE: n/a   POSTURE: rounded shoulders, posterior pelvic tilt, and flexed trunk   LOWER EXTREMITY ROM:     WFL grossly  LOWER EXTREMITY MMT:    MMT Right Eval Left Eval  Hip flexion 2- 2  Hip extension    Hip abduction 3+ 3+  Hip adduction 3 3  Hip internal rotation    Hip external rotation    Knee flexion 2 2+  Knee extension 2- 2  Ankle dorsiflexion 0 0  Ankle plantarflexion 0 0  Ankle inversion    Ankle eversion    (Blank rows = not tested)  BED MOBILITY:  Assess future session  TRANSFERS: Assistive device utilized: Environmental consultant - 2 wheeled  Sit to stand: CGA Stand to sit: CGA  RAMP:  Assess at future date   GAIT: Assess next session  FUNCTIONAL TESTS:  5 times sit to stand: unable to complete 2 consecutive sit to stands  10 meter walk test: perform in future session   PATIENT SURVEYS:  FOTO 34  TODAY'S TREATMENT: DATE: 02/27/23  Unless otherwise stated, all tasks performed below in // bars with gait belt applied,   Sit to Stands x multiple trials with upper extremity support on parallel bars (throughout session)   Static standing weight shifts in parallel bars, 15 sec bouts x2; cues for shift to the RLE -last one hold for prolonged time  Ambulate forward in // bars with wheelchair follow;  3.5 sets; sock on L foot, no no need to stabilization   Seated: Weighted ball (blue) -chest press 10x -overhead press 10x -rainbow arc 10x -weighted toss 10x   Glute squeeze 10x   Pt requires significant therapeutic rest breaks throughout the session due to fatigue.   PATIENT EDUCATION: Education details: goals, POC Person educated: Patient Education method: Explanation, Demonstration, Tactile cues, and Verbal cues Education comprehension: verbalized understanding, returned demonstration, verbal cues required, and tactile cues required  HOME EXERCISE PROGRAM:  To be given at next visit.   GOALS: Goals reviewed with patient? Yes  SHORT TERM GOALS: Target date: 03/06/2023  Patient will be independent in home exercise program to improve strength/mobility for better functional independence with ADLs.  Baseline: 10/1: give next session  Goal status: INITIAL   LONG TERM GOALS: Target date: 05/01/2023  Patient will increase FOTO score to equal to or greater than 48 to demonstrate statistically significant improvement in mobility and quality of life  Baseline: 10/1: 34%  Goal status: INITIAL  2.  Patient (> 54 years old) will complete five times sit to stand test in < 15 seconds indicating an increased LE strength and improved balance.  Baseline: 10/1; unable to tolerate two consecutive sit to stands Goal status: INITIAL  3.  Patient will increase 10 meter walk test to >0.5 m/s as to improve gait speed for better community ambulation and to reduce fall risk.  Baseline: 10/1: not tested this session  Goal status: INITIAL  4.  Patient will transfer independently at home and no longer require use  of [employer] for increased independence.  Baseline: 10/1: reliant upon lift  Goal status: INITIAL  Goal status: INITIAL  ASSESSMENT:  CLINICAL IMPRESSION: Patient is able to perform ambulation without knee block this session as well as an increase half lap indicating  improved mobility and stability. Patient is eager to progress her functional mobility and independent transfers.  Pt will continue to benefit from skilled therapy to address remaining deficits in order to improve overall QoL and return to PLOF.      OBJECTIVE IMPAIRMENTS: Abnormal gait, decreased activity tolerance, decreased balance, decreased coordination, decreased endurance, decreased mobility, difficulty walking, decreased strength, impaired perceived functional ability, improper body mechanics, and postural dysfunction.   ACTIVITY LIMITATIONS: carrying, lifting, bending, standing, squatting, sleeping, transfers, bed mobility, bathing, toileting, dressing, reach over head, hygiene/grooming, locomotion level, and caring for others  PARTICIPATION LIMITATIONS: meal prep, cleaning, laundry, driving, shopping, community activity, yard work, and church  PERSONAL FACTORS: back pain, CBP, chronic L shoulder pain, galactorrhea, neuropathy, HPV, hypercholestermeia, HTN, MS, osteopenia, PONV, wrist fracture are also affecting patient's functional outcome.   REHAB POTENTIAL: Good  CLINICAL DECISION MAKING: Evolving/moderate complexity  EVALUATION COMPLEXITY: Moderate  PLAN:  PT FREQUENCY: 2x/week  PT DURATION: 12 weeks  PLANNED INTERVENTIONS: Therapeutic exercises, Therapeutic activity, Neuromuscular re-education, Balance training, Gait training, Patient/Family education, Self Care, Joint mobilization, Stair training, Vestibular training, Canalith repositioning, Orthotic/Fit training, DME instructions, Dry Needling, Electrical stimulation, Spinal mobilization, Cryotherapy, Moist heat, Compression bandaging, scar mobilization, Taping, Traction, Ultrasound, Manual therapy, and Re-evaluation  PLAN FOR NEXT SESSION:  Continue with standing weight shift in // bars, walking in // bars, and bed mobility if time, HEP Lite Gait?       Precious Bard, PT, DPT Physical Therapist - [facility]  Outpatient Physical Therapy- Main Campus [phone]

## 2023-02-27 ENCOUNTER — Ambulatory Visit: Payer: PPO

## 2023-02-27 DIAGNOSIS — M6281 Muscle weakness (generalized): Secondary | ICD-10-CM | POA: Diagnosis not present

## 2023-02-27 DIAGNOSIS — R269 Unspecified abnormalities of gait and mobility: Secondary | ICD-10-CM

## 2023-02-27 DIAGNOSIS — R262 Difficulty in walking, not elsewhere classified: Secondary | ICD-10-CM

## 2023-02-27 DIAGNOSIS — R2681 Unsteadiness on feet: Secondary | ICD-10-CM

## 2023-02-27 DIAGNOSIS — M810 Age-related osteoporosis without current pathological fracture: Secondary | ICD-10-CM | POA: Diagnosis not present

## 2023-02-27 DIAGNOSIS — G35 Multiple sclerosis: Secondary | ICD-10-CM | POA: Diagnosis not present

## 2023-02-28 MED ORDER — TIZANIDINE HCL 4 MG PO TABS: ORAL_TABLET | ORAL | 3 refills | Status: DC

## 2023-02-28 MED ORDER — BACLOFEN 20 MG PO TABS: 20.0000 mg | ORAL_TABLET | Freq: Three times a day (TID) | ORAL | 3 refills | Status: DC

## 2023-02-28 NOTE — Therapy (Signed)
OUTPATIENT PHYSICAL THERAPY NEURO TREATMENT   Patient Name: Lisa Williams MRN: [id number] DOB:April 01, 1961, 62 y.o., female Today's Date: 03/01/2023   PCP: Lauro Regulus MD REFERRING PROVIDER: Lauro Regulus MD  END OF SESSION:  PT End of Session - 03/01/23 0943     Visit Number 7    Number of Visits 24    Date for PT Re-Evaluation 05/01/23    PT Start Time 1015    PT Stop Time 1059    PT Time Calculation (min) 44 min    Equipment Utilized During Treatment Gait belt    Activity Tolerance Patient tolerated treatment well    Behavior During Therapy [facility] for tasks assessed/performed                   Past Medical History:  Diagnosis Date   Abdominal pain, right upper quadrant    Back pain    Calculus of kidney 12/09/2013   Chronic back pain    unspecified   Chronic left shoulder pain 07/19/2015   Complication of anesthesia    Functional disorder of bladder    other   Galactorrhea 11/26/2014   Chronic    Hereditary and idiopathic neuropathy 08/19/2013   History of kidney stones    HPV test positive    Hypercholesteremia 08/19/2013   Hypertension    Incomplete bladder emptying    Microscopic hematuria    MS (multiple sclerosis) (HCC)    Muscle spasticity 05/21/2014   Nonspecific findings on examination of urine    other   Osteopenia    PONV (postoperative nausea and vomiting)    Status post laparoscopic supracervical hysterectomy 11/26/2014   Tobacco user 11/26/2014   Wrist fracture    Past Surgical History:  Procedure Laterality Date   bilateral tubal ligation  1996   BREAST CYST EXCISION Left 2002   CYST EXCISION Left 05/10/2022   Procedure: CYST REMOVAL;  Surgeon: Carolan Shiver, MD;  Location: [facility] ORS;  Service: General;  Laterality: Left;   FRACTURE SURGERY     KNEE SURGERY     right   LAPAROSCOPIC SUPRACERVICAL HYSTERECTOMY  08/05/2013   ORIF WRIST FRACTURE Left 01/17/2017   Procedure: OPEN REDUCTION INTERNAL  FIXATION (ORIF) WRIST FRACTURE;  Surgeon: Lyndle Herrlich, MD;  Location: [facility] ORS;  Service: Orthopedics;  Laterality: Left;   RADIOLOGY WITH ANESTHESIA N/A 03/18/2020   Procedure: MRI WITH ANESTHESIA CERVICAL SPINE AND BRAIN  WITH AND WITHOUT CONTRAST;  Surgeon: Radiologist, Medication, MD;  Location: [facility] OR;  Service: Radiology;  Laterality: N/A;   TUBAL LIGATION Bilateral    VAGINAL HYSTERECTOMY  03/2006   Patient Active Problem List   Diagnosis Date Noted   Hypokalemia 07/29/2022   Weakness of both lower extremities 07/29/2022   Abscess of left groin 11/15/2021   Abnormal LFTs 11/15/2021   Acute respiratory disease due to COVID-19 virus 11/21/2020   Weakness    Hypoalbuminemia due to protein-calorie malnutrition [facility])    Neurogenic bowel    Neurogenic bladder    Labile blood pressure    Neuropathic pain    Abscess of female pelvis    SVT (supraventricular tachycardia) (HCC)    Radial styloid tenosynovitis 03/12/2018   Wheelchair dependence 02/27/2018   Localized osteoporosis with current pathological fracture with routine healing 01/19/2017   Wrist fracture 01/16/2017   Sprain of ankle 03/23/2016   Closed fracture of lateral malleolus 03/16/2016   Health care maintenance 01/24/2016   Blood pressure elevated without history of HTN 10/25/2015  Essential hypertension 10/25/2015   Multiple sclerosis (HCC) 10/02/2015   Chronic left shoulder pain 07/19/2015   Multiple sclerosis exacerbation (HCC) 07/14/2015   MS (multiple sclerosis) (HCC) 11/26/2014   Increased body mass index 11/26/2014   HPV test positive 11/26/2014   Status post laparoscopic supracervical hysterectomy 11/26/2014   Galactorrhea 11/26/2014   Back ache 05/21/2014   Adiposity 05/21/2014   Disordered sleep 05/21/2014   Muscle spasticity 05/21/2014   Spasticity 05/21/2014   Calculus of kidney 12/09/2013   Renal colic 12/09/2013   Hypercholesteremia 08/19/2013   Hereditary and idiopathic neuropathy 08/19/2013    Hypercholesterolemia without hypertriglyceridemia 08/19/2013   Bladder infection, chronic 07/25/2012   Disorder of bladder function 07/25/2012   Incomplete bladder emptying 07/25/2012   Microscopic hematuria 07/25/2012   Right upper quadrant pain 07/25/2012    ONSET DATE: 1995  REFERRING DIAG: MS, weakness, other closed fracture of proximal end of R tibia  THERAPY DIAG:  Muscle weakness (generalized)  Difficulty in walking, not elsewhere classified  Unsteadiness on feet  Abnormality of gait and mobility  Rationale for Evaluation and Treatment: Rehabilitation  SUBJECTIVE:                                                                                                                                                                                             SUBJECTIVE STATEMENT: Patient reports she is feeling good.   Pt accompanied by: self  PERTINENT HISTORY: Patient is returning to PT s/p ORIF of R tibia shaft fracture 09/23/2022. Patient has weakness in BLE with RLE>LLE. She drives with hand controls. Patient has been diagnosed with MS in 1995. PMH includes: back pain, CBP, chronic L shoulder pain, galactorrhea, neuropathy, HPV, hypercholestermeia, HTN, MS, osteopenia, PONV, wrist fracture. Additional order for other closed fracture of proximal end of R tibia with routine healing. Still has to use [employer].   PAIN:  Are you having pain? No  PRECAUTIONS: Fall  RED FLAGS: None   WEIGHT BEARING RESTRICTIONS: No  FALLS: Has patient fallen in last 6 months? Yes. Number of falls 1  LIVING ENVIRONMENT: Lives with: lives with their family Lives in: House/apartment Stairs:  ramp from garage into house Has following equipment at home: Dan Humphreys - 2 wheeled, shower chair, Grab bars, and Ramped entry  PLOF: Independent with household mobility with device  PATIENT GOALS: get up and pivot and turn, sit to stands. Return to walk, go to bathroom on her own   OBJECTIVE:  Note:  Objective measures were completed at Evaluation unless otherwise noted.  DIAGNOSTIC FINDINGS: MRI of the brain 03/18/2020 showed multiple T2/FLAIR hyperintense foci in the periventricular,  juxtacortical and deep white matter.  There were no infratentorial lesions noted.  None of the foci enhanced.   Due to severe claustrophobia, the study was done with conscious sedation in the hospital   COGNITION: Overall cognitive status: Within functional limits for tasks assessed   SENSATION: Lack of sensation in feet, loss in bilateral lateral aspect of knee  COORDINATION: Does not have the strength for functional LE heel slide test   MUSCLE TONE: n/a   POSTURE: rounded shoulders, posterior pelvic tilt, and flexed trunk   LOWER EXTREMITY ROM:     WFL grossly  LOWER EXTREMITY MMT:    MMT Right Eval Left Eval  Hip flexion 2- 2  Hip extension    Hip abduction 3+ 3+  Hip adduction 3 3  Hip internal rotation    Hip external rotation    Knee flexion 2 2+  Knee extension 2- 2  Ankle dorsiflexion 0 0  Ankle plantarflexion 0 0  Ankle inversion    Ankle eversion    (Blank rows = not tested)  BED MOBILITY:  Assess future session  TRANSFERS: Assistive device utilized: Environmental consultant - 2 wheeled  Sit to stand: CGA Stand to sit: CGA  RAMP:  Assess at future date   GAIT: Assess next session  FUNCTIONAL TESTS:  5 times sit to stand: unable to complete 2 consecutive sit to stands  10 meter walk test: perform in future session   PATIENT SURVEYS:  FOTO 34  TODAY'S TREATMENT: DATE: 03/01/23  Unless otherwise stated, all tasks performed below in // bars with gait belt applied,   Sit to Stands x multiple trials with upper extremity support on parallel bars (throughout session)   Weight shift onto RLE step forward/backward LLE; 4x; 3 sets with seated rest breaks Forward step forward with RLE 4x; 2 sets    Seated: Seated on dynadisc: -static stand 60 seconds -lateral weight shifts  10x -forward weight shifts 10x -pattycake for pertubation's 30 seconds x2 trials  Forward and diagonal trunk lean  Pt requires significant therapeutic rest breaks throughout the session due to fatigue.   PATIENT EDUCATION: Education details: goals, POC Person educated: Patient Education method: Explanation, Demonstration, Tactile cues, and Verbal cues Education comprehension: verbalized understanding, returned demonstration, verbal cues required, and tactile cues required  HOME EXERCISE PROGRAM:  To be given at next visit.   GOALS: Goals reviewed with patient? Yes  SHORT TERM GOALS: Target date: 03/06/2023  Patient will be independent in home exercise program to improve strength/mobility for better functional independence with ADLs.  Baseline: 10/1: give next session  Goal status: INITIAL   LONG TERM GOALS: Target date: 05/01/2023  Patient will increase FOTO score to equal to or greater than 48 to demonstrate statistically significant improvement in mobility and quality of life  Baseline: 10/1: 34%  Goal status: INITIAL  2.  Patient (> 6 years old) will complete five times sit to stand test in < 15 seconds indicating an increased LE strength and improved balance.  Baseline: 10/1; unable to tolerate two consecutive sit to stands Goal status: INITIAL  3.  Patient will increase 10 meter walk test to >0.5 m/s as to improve gait speed for better community ambulation and to reduce fall risk.  Baseline: 10/1: not tested this session  Goal status: INITIAL  4.  Patient will transfer independently at home and no longer require use of [employer] for increased independence.  Baseline: 10/1: reliant upon lift  Goal status: INITIAL  Goal status:  INITIAL  ASSESSMENT:  CLINICAL IMPRESSION: Patient presents good motivation. Weight shifting onto RLE is challenging for patient but improves with repetition. She is eager to progress her functional mobility and stability. Finding  center on an unstable surface is challenging initially but improves. .  Pt will continue to benefit from skilled therapy to address remaining deficits in order to improve overall QoL and return to PLOF.      OBJECTIVE IMPAIRMENTS: Abnormal gait, decreased activity tolerance, decreased balance, decreased coordination, decreased endurance, decreased mobility, difficulty walking, decreased strength, impaired perceived functional ability, improper body mechanics, and postural dysfunction.   ACTIVITY LIMITATIONS: carrying, lifting, bending, standing, squatting, sleeping, transfers, bed mobility, bathing, toileting, dressing, reach over head, hygiene/grooming, locomotion level, and caring for others  PARTICIPATION LIMITATIONS: meal prep, cleaning, laundry, driving, shopping, community activity, yard work, and church  PERSONAL FACTORS: back pain, CBP, chronic L shoulder pain, galactorrhea, neuropathy, HPV, hypercholestermeia, HTN, MS, osteopenia, PONV, wrist fracture are also affecting patient's functional outcome.   REHAB POTENTIAL: Good  CLINICAL DECISION MAKING: Evolving/moderate complexity  EVALUATION COMPLEXITY: Moderate  PLAN:  PT FREQUENCY: 2x/week  PT DURATION: 12 weeks  PLANNED INTERVENTIONS: Therapeutic exercises, Therapeutic activity, Neuromuscular re-education, Balance training, Gait training, Patient/Family education, Self Care, Joint mobilization, Stair training, Vestibular training, Canalith repositioning, Orthotic/Fit training, DME instructions, Dry Needling, Electrical stimulation, Spinal mobilization, Cryotherapy, Moist heat, Compression bandaging, scar mobilization, Taping, Traction, Ultrasound, Manual therapy, and Re-evaluation  PLAN FOR NEXT SESSION:  Continue with standing weight shift in // bars, walking in // bars, and bed mobility if time, HEP Lite Gait?       Precious Bard, PT, DPT Physical Therapist - [facility]  Outpatient  Physical Therapy- Main Campus [phone]

## 2023-03-01 ENCOUNTER — Ambulatory Visit: Payer: PPO

## 2023-03-01 DIAGNOSIS — M6281 Muscle weakness (generalized): Secondary | ICD-10-CM

## 2023-03-01 DIAGNOSIS — R269 Unspecified abnormalities of gait and mobility: Secondary | ICD-10-CM

## 2023-03-01 DIAGNOSIS — R2681 Unsteadiness on feet: Secondary | ICD-10-CM

## 2023-03-01 DIAGNOSIS — R262 Difficulty in walking, not elsewhere classified: Secondary | ICD-10-CM

## 2023-03-05 NOTE — Therapy (Signed)
OUTPATIENT PHYSICAL THERAPY NEURO TREATMENT   Patient Name: Lisa Williams MRN: [id number] DOB:08-28-1960, 62 y.o., female Today's Date: 03/06/2023   PCP: Lauro Regulus MD REFERRING PROVIDER: Lauro Regulus MD  END OF SESSION:  PT End of Session - 03/06/23 0949     Visit Number 8    Number of Visits 24    Date for PT Re-Evaluation 05/01/23    PT Start Time 1015    PT Stop Time 1059    PT Time Calculation (min) 44 min    Equipment Utilized During Treatment Gait belt    Activity Tolerance Patient tolerated treatment well    Behavior During Therapy [facility] for tasks assessed/performed                    Past Medical History:  Diagnosis Date   Abdominal pain, right upper quadrant    Back pain    Calculus of kidney 12/09/2013   Chronic back pain    unspecified   Chronic left shoulder pain 07/19/2015   Complication of anesthesia    Functional disorder of bladder    other   Galactorrhea 11/26/2014   Chronic    Hereditary and idiopathic neuropathy 08/19/2013   History of kidney stones    HPV test positive    Hypercholesteremia 08/19/2013   Hypertension    Incomplete bladder emptying    Microscopic hematuria    MS (multiple sclerosis) (HCC)    Muscle spasticity 05/21/2014   Nonspecific findings on examination of urine    other   Osteopenia    PONV (postoperative nausea and vomiting)    Status post laparoscopic supracervical hysterectomy 11/26/2014   Tobacco user 11/26/2014   Wrist fracture    Past Surgical History:  Procedure Laterality Date   bilateral tubal ligation  1996   BREAST CYST EXCISION Left 2002   CYST EXCISION Left 05/10/2022   Procedure: CYST REMOVAL;  Surgeon: Carolan Shiver, MD;  Location: [facility] ORS;  Service: General;  Laterality: Left;   FRACTURE SURGERY     KNEE SURGERY     right   LAPAROSCOPIC SUPRACERVICAL HYSTERECTOMY  08/05/2013   ORIF WRIST FRACTURE Left 01/17/2017   Procedure: OPEN REDUCTION INTERNAL  FIXATION (ORIF) WRIST FRACTURE;  Surgeon: Lyndle Herrlich, MD;  Location: [facility] ORS;  Service: Orthopedics;  Laterality: Left;   RADIOLOGY WITH ANESTHESIA N/A 03/18/2020   Procedure: MRI WITH ANESTHESIA CERVICAL SPINE AND BRAIN  WITH AND WITHOUT CONTRAST;  Surgeon: Radiologist, Medication, MD;  Location: [facility];  Service: Radiology;  Laterality: N/A;   TUBAL LIGATION Bilateral    VAGINAL HYSTERECTOMY  03/2006   Patient Active Problem List   Diagnosis Date Noted   Hypokalemia 07/29/2022   Weakness of both lower extremities 07/29/2022   Abscess of left groin 11/15/2021   Abnormal LFTs 11/15/2021   Acute respiratory disease due to COVID-19 virus 11/21/2020   Weakness    Hypoalbuminemia due to protein-calorie malnutrition [facility])    Neurogenic bowel    Neurogenic bladder    Labile blood pressure    Neuropathic pain    Abscess of female pelvis    SVT (supraventricular tachycardia) (HCC)    Radial styloid tenosynovitis 03/12/2018   Wheelchair dependence 02/27/2018   Localized osteoporosis with current pathological fracture with routine healing 01/19/2017   Wrist fracture 01/16/2017   Sprain of ankle 03/23/2016   Closed fracture of lateral malleolus 03/16/2016   Health care maintenance 01/24/2016   Blood pressure elevated without history of HTN  10/25/2015   Essential hypertension 10/25/2015   Multiple sclerosis (HCC) 10/02/2015   Chronic left shoulder pain 07/19/2015   Multiple sclerosis exacerbation (HCC) 07/14/2015   MS (multiple sclerosis) (HCC) 11/26/2014   Increased body mass index 11/26/2014   HPV test positive 11/26/2014   Status post laparoscopic supracervical hysterectomy 11/26/2014   Galactorrhea 11/26/2014   Back ache 05/21/2014   Adiposity 05/21/2014   Disordered sleep 05/21/2014   Muscle spasticity 05/21/2014   Spasticity 05/21/2014   Calculus of kidney 12/09/2013   Renal colic 12/09/2013   Hypercholesteremia 08/19/2013   Hereditary and idiopathic neuropathy 08/19/2013    Hypercholesterolemia without hypertriglyceridemia 08/19/2013   Bladder infection, chronic 07/25/2012   Disorder of bladder function 07/25/2012   Incomplete bladder emptying 07/25/2012   Microscopic hematuria 07/25/2012   Right upper quadrant pain 07/25/2012    ONSET DATE: 1995  REFERRING DIAG: MS, weakness, other closed fracture of proximal end of R tibia  THERAPY DIAG:  Muscle weakness (generalized)  Difficulty in walking, not elsewhere classified  Unsteadiness on feet  Abnormality of gait and mobility  Rationale for Evaluation and Treatment: Rehabilitation  SUBJECTIVE:                                                                                                                                                                                             SUBJECTIVE STATEMENT: Patient reports she is feeling well.   Pt accompanied by: self  PERTINENT HISTORY: Patient is returning to PT s/p ORIF of R tibia shaft fracture 09/23/2022. Patient has weakness in BLE with RLE>LLE. She drives with hand controls. Patient has been diagnosed with MS in 1995. PMH includes: back pain, CBP, chronic L shoulder pain, galactorrhea, neuropathy, HPV, hypercholestermeia, HTN, MS, osteopenia, PONV, wrist fracture. Additional order for other closed fracture of proximal end of R tibia with routine healing. Still has to use [employer].   PAIN:  Are you having pain? No  PRECAUTIONS: Fall  RED FLAGS: None   WEIGHT BEARING RESTRICTIONS: No  FALLS: Has patient fallen in last 6 months? Yes. Number of falls 1  LIVING ENVIRONMENT: Lives with: lives with their family Lives in: House/apartment Stairs:  ramp from garage into house Has following equipment at home: Dan Humphreys - 2 wheeled, shower chair, Grab bars, and Ramped entry  PLOF: Independent with household mobility with device  PATIENT GOALS: get up and pivot and turn, sit to stands. Return to walk, go to bathroom on her own   OBJECTIVE:  Note:  Objective measures were completed at Evaluation unless otherwise noted.  DIAGNOSTIC FINDINGS: MRI of the brain 03/18/2020 showed multiple T2/FLAIR hyperintense foci  in the periventricular, juxtacortical and deep white matter.  There were no infratentorial lesions noted.  None of the foci enhanced.   Due to severe claustrophobia, the study was done with conscious sedation in the hospital   COGNITION: Overall cognitive status: Within functional limits for tasks assessed   SENSATION: Lack of sensation in feet, loss in bilateral lateral aspect of knee  COORDINATION: Does not have the strength for functional LE heel slide test   MUSCLE TONE: n/a   POSTURE: rounded shoulders, posterior pelvic tilt, and flexed trunk   LOWER EXTREMITY ROM:     WFL grossly  LOWER EXTREMITY MMT:    MMT Right Eval Left Eval  Hip flexion 2- 2  Hip extension    Hip abduction 3+ 3+  Hip adduction 3 3  Hip internal rotation    Hip external rotation    Knee flexion 2 2+  Knee extension 2- 2  Ankle dorsiflexion 0 0  Ankle plantarflexion 0 0  Ankle inversion    Ankle eversion    (Blank rows = not tested)  BED MOBILITY:  Assess future session  TRANSFERS: Assistive device utilized: Environmental consultant - 2 wheeled  Sit to stand: CGA Stand to sit: CGA  RAMP:  Assess at future date   GAIT: Assess next session  FUNCTIONAL TESTS:  5 times sit to stand: unable to complete 2 consecutive sit to stands  10 meter walk test: perform in future session   PATIENT SURVEYS:  FOTO 34  TODAY'S TREATMENT: DATE: 03/06/23  Unless otherwise stated, all tasks performed below in // bars with gait belt applied,   Ambulate with BRW and wheelchair follow. Sock on L foot. -unable to stand from chair with airex pad on chair for lift. Attempted multiple times unsuccessfully   Sit to stand techniques:  -PT in front of patient, bilateral hands on chair mini lift from chair with focus on pressing LE's into ground 5x; 3  sets -walker in front of patient, PT in front of walker, one hand on walker one hand on chair, focus on pressing into ground with LE's 5x; 2 sets  LE modified press into PT leg 5x; 2 sets each LE  Trunk flexion/extension 10x   Pt requires significant therapeutic rest breaks throughout the session due to fatigue.   PATIENT EDUCATION: Education details: goals, POC Person educated: Patient Education method: Explanation, Demonstration, Tactile cues, and Verbal cues Education comprehension: verbalized understanding, returned demonstration, verbal cues required, and tactile cues required  HOME EXERCISE PROGRAM:  To be given at next visit.   GOALS: Goals reviewed with patient? Yes  SHORT TERM GOALS: Target date: 03/06/2023  Patient will be independent in home exercise program to improve strength/mobility for better functional independence with ADLs.  Baseline: 10/1: give next session  Goal status: INITIAL   LONG TERM GOALS: Target date: 05/01/2023  Patient will increase FOTO score to equal to or greater than 48 to demonstrate statistically significant improvement in mobility and quality of life  Baseline: 10/1: 34%  Goal status: INITIAL  2.  Patient (> 20 years old) will complete five times sit to stand test in < 15 seconds indicating an increased LE strength and improved balance.  Baseline: 10/1; unable to tolerate two consecutive sit to stands Goal status: INITIAL  3.  Patient will increase 10 meter walk test to >0.5 m/s as to improve gait speed for better community ambulation and to reduce fall risk.  Baseline: 10/1: not tested this session  Goal status: INITIAL  4.  Patient will transfer independently at home and no longer require use of [employer]ift for increased independence.  Baseline: 10/1: reliant upon lift  Goal status: INITIAL  Goal status: INITIAL  ASSESSMENT:  CLINICAL IMPRESSION: Patient is unable to tolerate standing from chair with walker due to only  utilizing Ue's rather than LE's. Break down of sit to stand tolerated well with fatigue noted in LE's. Focus on LE activation and modification performed with rest breaks.  Pt will continue to benefit from skilled therapy to address remaining deficits in order to improve overall QoL and return to PLOF.      OBJECTIVE IMPAIRMENTS: Abnormal gait, decreased activity tolerance, decreased balance, decreased coordination, decreased endurance, decreased mobility, difficulty walking, decreased strength, impaired perceived functional ability, improper body mechanics, and postural dysfunction.   ACTIVITY LIMITATIONS: carrying, lifting, bending, standing, squatting, sleeping, transfers, bed mobility, bathing, toileting, dressing, reach over head, hygiene/grooming, locomotion level, and caring for others  PARTICIPATION LIMITATIONS: meal prep, cleaning, laundry, driving, shopping, community activity, yard work, and church  PERSONAL FACTORS: back pain, CBP, chronic L shoulder pain, galactorrhea, neuropathy, HPV, hypercholestermeia, HTN, MS, osteopenia, PONV, wrist fracture are also affecting patient's functional outcome.   REHAB POTENTIAL: Good  CLINICAL DECISION MAKING: Evolving/moderate complexity  EVALUATION COMPLEXITY: Moderate  PLAN:  PT FREQUENCY: 2x/week  PT DURATION: 12 weeks  PLANNED INTERVENTIONS: Therapeutic exercises, Therapeutic activity, Neuromuscular re-education, Balance training, Gait training, Patient/Family education, Self Care, Joint mobilization, Stair training, Vestibular training, Canalith repositioning, Orthotic/Fit training, DME instructions, Dry Needling, Electrical stimulation, Spinal mobilization, Cryotherapy, Moist heat, Compression bandaging, scar mobilization, Taping, Traction, Ultrasound, Manual therapy, and Re-evaluation  PLAN FOR NEXT SESSION:  Continue with standing weight shift in // bars, walking in // bars, and bed mobility if time, HEP Lite Gait?        Precious Bard, PT, DPT Physical Therapist - [facility]  Outpatient Physical Therapy- Main Campus [phone]

## 2023-03-06 ENCOUNTER — Ambulatory Visit: Payer: PPO

## 2023-03-06 DIAGNOSIS — R2681 Unsteadiness on feet: Secondary | ICD-10-CM

## 2023-03-06 DIAGNOSIS — R269 Unspecified abnormalities of gait and mobility: Secondary | ICD-10-CM

## 2023-03-06 DIAGNOSIS — R262 Difficulty in walking, not elsewhere classified: Secondary | ICD-10-CM

## 2023-03-06 DIAGNOSIS — M6281 Muscle weakness (generalized): Secondary | ICD-10-CM | POA: Diagnosis not present

## 2023-03-06 NOTE — Therapy (Signed)
OUTPATIENT PHYSICAL THERAPY NEURO TREATMENT   Patient Name: Lisa Williams MRN: [id number] DOB:09/19/1960, 62 y.o., female Today's Date: 03/08/2023   PCP: Lauro Regulus MD REFERRING PROVIDER: Lauro Regulus MD  END OF SESSION:  PT End of Session - 03/08/23 0955     Visit Number 9    Number of Visits 24    Date for PT Re-Evaluation 05/01/23    PT Start Time 1015    PT Stop Time 1059    PT Time Calculation (min) 44 min    Equipment Utilized During Treatment Gait belt    Activity Tolerance Patient tolerated treatment well    Behavior During Therapy [facility] for tasks assessed/performed                     Past Medical History:  Diagnosis Date   Abdominal pain, right upper quadrant    Back pain    Calculus of kidney 12/09/2013   Chronic back pain    unspecified   Chronic left shoulder pain 07/19/2015   Complication of anesthesia    Functional disorder of bladder    other   Galactorrhea 11/26/2014   Chronic    Hereditary and idiopathic neuropathy 08/19/2013   History of kidney stones    HPV test positive    Hypercholesteremia 08/19/2013   Hypertension    Incomplete bladder emptying    Microscopic hematuria    MS (multiple sclerosis) (HCC)    Muscle spasticity 05/21/2014   Nonspecific findings on examination of urine    other   Osteopenia    PONV (postoperative nausea and vomiting)    Status post laparoscopic supracervical hysterectomy 11/26/2014   Tobacco user 11/26/2014   Wrist fracture    Past Surgical History:  Procedure Laterality Date   bilateral tubal ligation  1996   BREAST CYST EXCISION Left 2002   CYST EXCISION Left 05/10/2022   Procedure: CYST REMOVAL;  Surgeon: Carolan Shiver, MD;  Location: [facility] ORS;  Service: General;  Laterality: Left;   FRACTURE SURGERY     KNEE SURGERY     right   LAPAROSCOPIC SUPRACERVICAL HYSTERECTOMY  08/05/2013   ORIF WRIST FRACTURE Left 01/17/2017   Procedure: OPEN REDUCTION INTERNAL  FIXATION (ORIF) WRIST FRACTURE;  Surgeon: Lyndle Herrlich, MD;  Location: [facility] ORS;  Service: Orthopedics;  Laterality: Left;   RADIOLOGY WITH ANESTHESIA N/A 03/18/2020   Procedure: MRI WITH ANESTHESIA CERVICAL SPINE AND BRAIN  WITH AND WITHOUT CONTRAST;  Surgeon: Radiologist, Medication, MD;  Location: [facility] OR;  Service: Radiology;  Laterality: N/A;   TUBAL LIGATION Bilateral    VAGINAL HYSTERECTOMY  03/2006   Patient Active Problem List   Diagnosis Date Noted   Hypokalemia 07/29/2022   Weakness of both lower extremities 07/29/2022   Abscess of left groin 11/15/2021   Abnormal LFTs 11/15/2021   Acute respiratory disease due to COVID-19 virus 11/21/2020   Weakness    Hypoalbuminemia due to protein-calorie malnutrition [facility])    Neurogenic bowel    Neurogenic bladder    Labile blood pressure    Neuropathic pain    Abscess of female pelvis    SVT (supraventricular tachycardia) (HCC)    Radial styloid tenosynovitis 03/12/2018   Wheelchair dependence 02/27/2018   Localized osteoporosis with current pathological fracture with routine healing 01/19/2017   Wrist fracture 01/16/2017   Sprain of ankle 03/23/2016   Closed fracture of lateral malleolus 03/16/2016   Health care maintenance 01/24/2016   Blood pressure elevated without history of  HTN 10/25/2015   Essential hypertension 10/25/2015   Multiple sclerosis (HCC) 10/02/2015   Chronic left shoulder pain 07/19/2015   Multiple sclerosis exacerbation (HCC) 07/14/2015   MS (multiple sclerosis) (HCC) 11/26/2014   Increased body mass index 11/26/2014   HPV test positive 11/26/2014   Status post laparoscopic supracervical hysterectomy 11/26/2014   Galactorrhea 11/26/2014   Back ache 05/21/2014   Adiposity 05/21/2014   Disordered sleep 05/21/2014   Muscle spasticity 05/21/2014   Spasticity 05/21/2014   Calculus of kidney 12/09/2013   Renal colic 12/09/2013   Hypercholesteremia 08/19/2013   Hereditary and idiopathic neuropathy 08/19/2013    Hypercholesterolemia without hypertriglyceridemia 08/19/2013   Bladder infection, chronic 07/25/2012   Disorder of bladder function 07/25/2012   Incomplete bladder emptying 07/25/2012   Microscopic hematuria 07/25/2012   Right upper quadrant pain 07/25/2012    ONSET DATE: 1995  REFERRING DIAG: MS, weakness, other closed fracture of proximal end of R tibia  THERAPY DIAG:  Muscle weakness (generalized)  Difficulty in walking, not elsewhere classified  Unsteadiness on feet  Rationale for Evaluation and Treatment: Rehabilitation  SUBJECTIVE:                                                                                                                                                                                             SUBJECTIVE STATEMENT: Patient reports she is feeling good, ready to work.   Pt accompanied by: self  PERTINENT HISTORY: Patient is returning to PT s/p ORIF of R tibia shaft fracture 09/23/2022. Patient has weakness in BLE with RLE>LLE. She drives with hand controls. Patient has been diagnosed with MS in 1995. PMH includes: back pain, CBP, chronic L shoulder pain, galactorrhea, neuropathy, HPV, hypercholestermeia, HTN, MS, osteopenia, PONV, wrist fracture. Additional order for other closed fracture of proximal end of R tibia with routine healing. Still has to use [employer].   PAIN:  Are you having pain? No  PRECAUTIONS: Fall  RED FLAGS: None   WEIGHT BEARING RESTRICTIONS: No  FALLS: Has patient fallen in last 6 months? Yes. Number of falls 1  LIVING ENVIRONMENT: Lives with: lives with their family Lives in: House/apartment Stairs:  ramp from garage into house Has following equipment at home: Dan Humphreys - 2 wheeled, shower chair, Grab bars, and Ramped entry  PLOF: Independent with household mobility with device  PATIENT GOALS: get up and pivot and turn, sit to stands. Return to walk, go to bathroom on her own   OBJECTIVE:  Note: Objective measures  were completed at Evaluation unless otherwise noted.  DIAGNOSTIC FINDINGS: MRI of the brain 03/18/2020 showed multiple T2/FLAIR hyperintense foci in the  periventricular, juxtacortical and deep white matter.  There were no infratentorial lesions noted.  None of the foci enhanced.   Due to severe claustrophobia, the study was done with conscious sedation in the hospital   COGNITION: Overall cognitive status: Within functional limits for tasks assessed   SENSATION: Lack of sensation in feet, loss in bilateral lateral aspect of knee  COORDINATION: Does not have the strength for functional LE heel slide test   MUSCLE TONE: n/a   POSTURE: rounded shoulders, posterior pelvic tilt, and flexed trunk   LOWER EXTREMITY ROM:     WFL grossly  LOWER EXTREMITY MMT:    MMT Right Eval Left Eval  Hip flexion 2- 2  Hip extension    Hip abduction 3+ 3+  Hip adduction 3 3  Hip internal rotation    Hip external rotation    Knee flexion 2 2+  Knee extension 2- 2  Ankle dorsiflexion 0 0  Ankle plantarflexion 0 0  Ankle inversion    Ankle eversion    (Blank rows = not tested)  BED MOBILITY:  Assess future session  TRANSFERS: Assistive device utilized: Environmental consultant - 2 wheeled  Sit to stand: CGA Stand to sit: CGA  RAMP:  Assess at future date   GAIT: Assess next session  FUNCTIONAL TESTS:  5 times sit to stand: unable to complete 2 consecutive sit to stands  10 meter walk test: perform in future session   PATIENT SURVEYS:  FOTO 34  TODAY'S TREATMENT: DATE: 03/08/23  Unless otherwise stated, all tasks performed below in // bars with gait belt applied,   Sit to Stands x multiple trials with upper extremity support on parallel bars (throughout session)      Ambulate forward in // bars with wheelchair follow;2 sets; sock on L foot, no no need to stabilization  Backwards ambulation in // bars: focus on weight shift onto RLE with posterior step with LLE; 3-4 steps each LE; 2 sets   Forward/backward step 5x each LE; seated rest break between.   Seated: Slider:  AAROM knee extension  AROM flexion RLE 10x; AROM flexion/extension LLE 10x Upward Y 10x   Pt requires significant therapeutic rest breaks throughout the session due to fatigue.   PATIENT EDUCATION: Education details: goals, POC Person educated: Patient Education method: Explanation, Demonstration, Tactile cues, and Verbal cues Education comprehension: verbalized understanding, returned demonstration, verbal cues required, and tactile cues required  HOME EXERCISE PROGRAM:  To be given at next visit.   GOALS: Goals reviewed with patient? Yes  SHORT TERM GOALS: Target date: 03/06/2023  Patient will be independent in home exercise program to improve strength/mobility for better functional independence with ADLs.  Baseline: 10/1: give next session  Goal status: INITIAL   LONG TERM GOALS: Target date: 05/01/2023  Patient will increase FOTO score to equal to or greater than 48 to demonstrate statistically significant improvement in mobility and quality of life  Baseline: 10/1: 34%  Goal status: INITIAL  2.  Patient (> 77 years old) will complete five times sit to stand test in < 15 seconds indicating an increased LE strength and improved balance.  Baseline: 10/1; unable to tolerate two consecutive sit to stands Goal status: INITIAL  3.  Patient will increase 10 meter walk test to >0.5 m/s as to improve gait speed for better community ambulation and to reduce fall risk.  Baseline: 10/1: not tested this session  Goal status: INITIAL  4.  Patient will transfer independently at home and no longer  require use of [employer] for increased independence.  Baseline: 10/1: reliant upon lift  Goal status: INITIAL  Goal status: INITIAL  ASSESSMENT:  CLINICAL IMPRESSION: Patient is challenged with posterior ambulation due to limited weight shift onto RLE this session. She tolerates progressive  standing interventions with decreased fatigue this session. She is able to perform AROM flexion/extension LAQ in seated with use of slider for LLE but requires AAROM for knee extension with slider for RLE.   Pt will continue to benefit from skilled therapy to address remaining deficits in order to improve overall QoL and return to PLOF.      OBJECTIVE IMPAIRMENTS: Abnormal gait, decreased activity tolerance, decreased balance, decreased coordination, decreased endurance, decreased mobility, difficulty walking, decreased strength, impaired perceived functional ability, improper body mechanics, and postural dysfunction.   ACTIVITY LIMITATIONS: carrying, lifting, bending, standing, squatting, sleeping, transfers, bed mobility, bathing, toileting, dressing, reach over head, hygiene/grooming, locomotion level, and caring for others  PARTICIPATION LIMITATIONS: meal prep, cleaning, laundry, driving, shopping, community activity, yard work, and church  PERSONAL FACTORS: back pain, CBP, chronic L shoulder pain, galactorrhea, neuropathy, HPV, hypercholestermeia, HTN, MS, osteopenia, PONV, wrist fracture are also affecting patient's functional outcome.   REHAB POTENTIAL: Good  CLINICAL DECISION MAKING: Evolving/moderate complexity  EVALUATION COMPLEXITY: Moderate  PLAN:  PT FREQUENCY: 2x/week  PT DURATION: 12 weeks  PLANNED INTERVENTIONS: Therapeutic exercises, Therapeutic activity, Neuromuscular re-education, Balance training, Gait training, Patient/Family education, Self Care, Joint mobilization, Stair training, Vestibular training, Canalith repositioning, Orthotic/Fit training, DME instructions, Dry Needling, Electrical stimulation, Spinal mobilization, Cryotherapy, Moist heat, Compression bandaging, scar mobilization, Taping, Traction, Ultrasound, Manual therapy, and Re-evaluation  PLAN FOR NEXT SESSION:  Continue with standing weight shift in // bars, walking in // bars, and bed mobility if  time, HEP       Precious Bard, PT, DPT Physical Therapist - [facility]  Outpatient Physical Therapy- Main Campus [phone]

## 2023-03-08 ENCOUNTER — Ambulatory Visit: Payer: PPO

## 2023-03-08 DIAGNOSIS — M6281 Muscle weakness (generalized): Secondary | ICD-10-CM | POA: Diagnosis not present

## 2023-03-08 DIAGNOSIS — R2681 Unsteadiness on feet: Secondary | ICD-10-CM

## 2023-03-08 DIAGNOSIS — R262 Difficulty in walking, not elsewhere classified: Secondary | ICD-10-CM

## 2023-03-10 ENCOUNTER — Encounter: Payer: Self-pay | Admitting: Neurology

## 2023-03-12 ENCOUNTER — Other Ambulatory Visit: Payer: Self-pay

## 2023-03-12 DIAGNOSIS — G35 Multiple sclerosis: Secondary | ICD-10-CM

## 2023-03-12 MED ORDER — MAYZENT 2 MG PO TABS
2.0000 mg | ORAL_TABLET | Freq: Every day | ORAL | 11 refills | Status: DC
Start: 2023-03-12 — End: 2024-01-09

## 2023-03-12 NOTE — Therapy (Signed)
OUTPATIENT PHYSICAL THERAPY NEURO TREATMENT/ Physical Therapy Progress Note   Dates of reporting period  02/06/23   to   03/13/23     Patient Name: Lisa Williams MRN: [id number] DOB:March 17, 1961, 62 y.o., female Today's Date: 03/13/2023   PCP: Lauro Regulus MD REFERRING PROVIDER: Lauro Regulus MD  END OF SESSION:  PT End of Session - 03/13/23 0950     Visit Number 10    Number of Visits 24    Date for PT Re-Evaluation 05/01/23    PT Start Time 1010    PT Stop Time 1055    PT Time Calculation (min) 45 min    Equipment Utilized During Treatment Gait belt    Activity Tolerance Patient tolerated treatment well    Behavior During Therapy [facility] for tasks assessed/performed                      Past Medical History:  Diagnosis Date   Abdominal pain, right upper quadrant    Back pain    Calculus of kidney 12/09/2013   Chronic back pain    unspecified   Chronic left shoulder pain 07/19/2015   Complication of anesthesia    Functional disorder of bladder    other   Galactorrhea 11/26/2014   Chronic    Hereditary and idiopathic neuropathy 08/19/2013   History of kidney stones    HPV test positive    Hypercholesteremia 08/19/2013   Hypertension    Incomplete bladder emptying    Microscopic hematuria    MS (multiple sclerosis) (HCC)    Muscle spasticity 05/21/2014   Nonspecific findings on examination of urine    other   Osteopenia    PONV (postoperative nausea and vomiting)    Status post laparoscopic supracervical hysterectomy 11/26/2014   Tobacco user 11/26/2014   Wrist fracture    Past Surgical History:  Procedure Laterality Date   bilateral tubal ligation  1996   BREAST CYST EXCISION Left 2002   CYST EXCISION Left 05/10/2022   Procedure: CYST REMOVAL;  Surgeon: Carolan Shiver, MD;  Location: [facility] ORS;  Service: General;  Laterality: Left;   FRACTURE SURGERY     KNEE SURGERY     right   LAPAROSCOPIC SUPRACERVICAL  HYSTERECTOMY  08/05/2013   ORIF WRIST FRACTURE Left 01/17/2017   Procedure: OPEN REDUCTION INTERNAL FIXATION (ORIF) WRIST FRACTURE;  Surgeon: Lyndle Herrlich, MD;  Location: [facility] ORS;  Service: Orthopedics;  Laterality: Left;   RADIOLOGY WITH ANESTHESIA N/A 03/18/2020   Procedure: MRI WITH ANESTHESIA CERVICAL SPINE AND BRAIN  WITH AND WITHOUT CONTRAST;  Surgeon: Radiologist, Medication, MD;  Location: [facility] OR;  Service: Radiology;  Laterality: N/A;   TUBAL LIGATION Bilateral    VAGINAL HYSTERECTOMY  03/2006   Patient Active Problem List   Diagnosis Date Noted   Hypokalemia 07/29/2022   Weakness of both lower extremities 07/29/2022   Abscess of left groin 11/15/2021   Abnormal LFTs 11/15/2021   Acute respiratory disease due to COVID-19 virus 11/21/2020   Weakness    Hypoalbuminemia due to protein-calorie malnutrition [facility])    Neurogenic bowel    Neurogenic bladder    Labile blood pressure    Neuropathic pain    Abscess of female pelvis    SVT (supraventricular tachycardia) (HCC)    Radial styloid tenosynovitis 03/12/2018   Wheelchair dependence 02/27/2018   Localized osteoporosis with current pathological fracture with routine healing 01/19/2017   Wrist fracture 01/16/2017   Sprain of ankle 03/23/2016  Closed fracture of lateral malleolus 03/16/2016   Health care maintenance 01/24/2016   Blood pressure elevated without history of HTN 10/25/2015   Essential hypertension 10/25/2015   Multiple sclerosis (HCC) 10/02/2015   Chronic left shoulder pain 07/19/2015   Multiple sclerosis exacerbation (HCC) 07/14/2015   MS (multiple sclerosis) (HCC) 11/26/2014   Increased body mass index 11/26/2014   HPV test positive 11/26/2014   Status post laparoscopic supracervical hysterectomy 11/26/2014   Galactorrhea 11/26/2014   Back ache 05/21/2014   Adiposity 05/21/2014   Disordered sleep 05/21/2014   Muscle spasticity 05/21/2014   Spasticity 05/21/2014   Calculus of kidney 12/09/2013   Renal  colic 12/09/2013   Hypercholesteremia 08/19/2013   Hereditary and idiopathic neuropathy 08/19/2013   Hypercholesterolemia without hypertriglyceridemia 08/19/2013   Bladder infection, chronic 07/25/2012   Disorder of bladder function 07/25/2012   Incomplete bladder emptying 07/25/2012   Microscopic hematuria 07/25/2012   Right upper quadrant pain 07/25/2012    ONSET DATE: 1995  REFERRING DIAG: MS, weakness, other closed fracture of proximal end of R tibia  THERAPY DIAG:  Muscle weakness (generalized)  Difficulty in walking, not elsewhere classified  Unsteadiness on feet  Rationale for Evaluation and Treatment: Rehabilitation  SUBJECTIVE:                                                                                                                                                                                             SUBJECTIVE STATEMENT: Patient reports she is feeling good today.   Pt accompanied by: self  PERTINENT HISTORY: Patient is returning to PT s/p ORIF of R tibia shaft fracture 09/23/2022. Patient has weakness in BLE with RLE>LLE. She drives with hand controls. Patient has been diagnosed with MS in 1995. PMH includes: back pain, CBP, chronic L shoulder pain, galactorrhea, neuropathy, HPV, hypercholestermeia, HTN, MS, osteopenia, PONV, wrist fracture. Additional order for other closed fracture of proximal end of R tibia with routine healing. Still has to use [employer].   PAIN:  Are you having pain? No  PRECAUTIONS: Fall  RED FLAGS: None   WEIGHT BEARING RESTRICTIONS: No  FALLS: Has patient fallen in last 6 months? Yes. Number of falls 1  LIVING ENVIRONMENT: Lives with: lives with their family Lives in: House/apartment Stairs:  ramp from garage into house Has following equipment at home: Dan Humphreys - 2 wheeled, shower chair, Grab bars, and Ramped entry  PLOF: Independent with household mobility with device  PATIENT GOALS: get up and pivot and turn, sit to  stands. Return to walk, go to bathroom on her own   OBJECTIVE:  Note: Objective measures were completed at Evaluation  unless otherwise noted.  DIAGNOSTIC FINDINGS: MRI of the brain 03/18/2020 showed multiple T2/FLAIR hyperintense foci in the periventricular, juxtacortical and deep white matter.  There were no infratentorial lesions noted.  None of the foci enhanced.   Due to severe claustrophobia, the study was done with conscious sedation in the hospital   COGNITION: Overall cognitive status: Within functional limits for tasks assessed   SENSATION: Lack of sensation in feet, loss in bilateral lateral aspect of knee  COORDINATION: Does not have the strength for functional LE heel slide test   MUSCLE TONE: n/a   POSTURE: rounded shoulders, posterior pelvic tilt, and flexed trunk   LOWER EXTREMITY ROM:     WFL grossly  LOWER EXTREMITY MMT:    MMT Right Eval Left Eval  Hip flexion 2- 2  Hip extension    Hip abduction 3+ 3+  Hip adduction 3 3  Hip internal rotation    Hip external rotation    Knee flexion 2 2+  Knee extension 2- 2  Ankle dorsiflexion 0 0  Ankle plantarflexion 0 0  Ankle inversion    Ankle eversion    (Blank rows = not tested)  BED MOBILITY:  Assess future session  TRANSFERS: Assistive device utilized: Environmental consultant - 2 wheeled  Sit to stand: CGA Stand to sit: CGA  RAMP:  Assess at future date   GAIT: Assess next session  FUNCTIONAL TESTS:  5 times sit to stand: unable to complete 2 consecutive sit to stands  10 meter walk test: perform in future session   PATIENT SURVEYS:  FOTO 34  TODAY'S TREATMENT: DATE: 03/13/23   Physical therapy treatment session today consisted of completing assessment of goals and administration of testing as demonstrated and documented in flow sheet, treatment, and goals section of this note. Addition treatments may be found below.    Seated:  Sit to stand techniques:  -PT in front of patient, bilateral hands  on chair attempt stand from chair with focus on pressing LE's into ground 3x; 3 sets -walker in front of patient, PT in front of walker, holding onto legs of patient for BLE stabilization 3x;    LE modified press into PT leg 5x; 2 sets each LE   Trunk flexion/extension 10x  RTB abduction 10x; 2 sets Pectoral open 10x each side   Pt requires significant therapeutic rest breaks throughout the session due to fatigue.   PATIENT EDUCATION: Education details: goals, POC Person educated: Patient Education method: Explanation, Demonstration, Tactile cues, and Verbal cues Education comprehension: verbalized understanding, returned demonstration, verbal cues required, and tactile cues required  HOME EXERCISE PROGRAM:  To be given at next visit.   GOALS: Goals reviewed with patient? Yes  SHORT TERM GOALS: Target date: 03/06/2023  Patient will be independent in home exercise program to improve strength/mobility for better functional independence with ADLs.  Baseline: 10/1: give next session 11/5: HEP compliant Goal status: MET   LONG TERM GOALS: Target date: 05/01/2023  Patient will increase FOTO score to equal to or greater than 48 to demonstrate statistically significant improvement in mobility and quality of life  Baseline: 10/1: 34%  11/5: 39.8%  Goal status: partially met   2.  Patient (> 49 years old) will complete five times sit to stand test in < 15 seconds indicating an increased LE strength and improved balance.  Baseline: 10/1; unable to tolerate two consecutive sit to stands 11/5: 2.19.26 5 partial sit to stands  Goal status: Partially Met  3.  Patient will  increase 10 meter walk test to >0.5 m/s as to improve gait speed for better community ambulation and to reduce fall risk.  Baseline: 10/1: not tested this session 11/5: unable to stand from wheelchair fully to start walking.  Goal status: Ongoing  4.  Patient will transfer independently at home and no longer require  use of [employer] for increased independence.  Baseline: 10/1: reliant upon lift 11/5: reliant upon lift still  Goal status: partially met    ASSESSMENT:  CLINICAL IMPRESSION: Patient's condition has the potential to improve in response to therapy. Maximum improvement is yet to be obtained. The anticipated improvement is attainable and reasonable in a generally predictable time. Patient is able to perform five consecutive partial sit to stands for first time this session indicating improved ability to perform mobility. Patient to bring her cushion she used at home during home health to attempt standing ambulation as she still cannot reach  a full stand from chair.  Pt will continue to benefit from skilled therapy to address remaining deficits in order to improve overall QoL and return to PLOF.      OBJECTIVE IMPAIRMENTS: Abnormal gait, decreased activity tolerance, decreased balance, decreased coordination, decreased endurance, decreased mobility, difficulty walking, decreased strength, impaired perceived functional ability, improper body mechanics, and postural dysfunction.   ACTIVITY LIMITATIONS: carrying, lifting, bending, standing, squatting, sleeping, transfers, bed mobility, bathing, toileting, dressing, reach over head, hygiene/grooming, locomotion level, and caring for others  PARTICIPATION LIMITATIONS: meal prep, cleaning, laundry, driving, shopping, community activity, yard work, and church  PERSONAL FACTORS: back pain, CBP, chronic L shoulder pain, galactorrhea, neuropathy, HPV, hypercholestermeia, HTN, MS, osteopenia, PONV, wrist fracture are also affecting patient's functional outcome.   REHAB POTENTIAL: Good  CLINICAL DECISION MAKING: Evolving/moderate complexity  EVALUATION COMPLEXITY: Moderate  PLAN:  PT FREQUENCY: 2x/week  PT DURATION: 12 weeks  PLANNED INTERVENTIONS: Therapeutic exercises, Therapeutic activity, Neuromuscular re-education, Balance training, Gait  training, Patient/Family education, Self Care, Joint mobilization, Stair training, Vestibular training, Canalith repositioning, Orthotic/Fit training, DME instructions, Dry Needling, Electrical stimulation, Spinal mobilization, Cryotherapy, Moist heat, Compression bandaging, scar mobilization, Taping, Traction, Ultrasound, Manual therapy, and Re-evaluation  PLAN FOR NEXT SESSION:  Continue with standing weight shift in // bars, walking in // bars, and bed mobility if time, HEP       Precious Bard, PT, DPT Physical Therapist - [facility]  Outpatient Physical Therapy- Main Campus [phone]

## 2023-03-13 ENCOUNTER — Ambulatory Visit: Payer: PPO | Attending: Neurology

## 2023-03-13 DIAGNOSIS — R278 Other lack of coordination: Secondary | ICD-10-CM | POA: Diagnosis not present

## 2023-03-13 DIAGNOSIS — R2681 Unsteadiness on feet: Secondary | ICD-10-CM | POA: Insufficient documentation

## 2023-03-13 DIAGNOSIS — M6281 Muscle weakness (generalized): Secondary | ICD-10-CM | POA: Diagnosis not present

## 2023-03-13 DIAGNOSIS — R269 Unspecified abnormalities of gait and mobility: Secondary | ICD-10-CM | POA: Insufficient documentation

## 2023-03-13 DIAGNOSIS — R262 Difficulty in walking, not elsewhere classified: Secondary | ICD-10-CM | POA: Diagnosis not present

## 2023-03-14 NOTE — Therapy (Signed)
OUTPATIENT PHYSICAL THERAPY NEURO TREATMENT     Patient Name: Lisa Williams MRN: [id number] DOB:1960/05/19, 62 y.o., female Today's Date: 03/15/2023   PCP: Lauro Regulus MD REFERRING PROVIDER: Lauro Regulus MD  END OF SESSION:  PT End of Session - 03/15/23 1016     Visit Number 11    Number of Visits 24    Date for PT Re-Evaluation 05/01/23    PT Start Time 1015    PT Stop Time 1059    PT Time Calculation (min) 44 min    Equipment Utilized During Treatment Gait belt    Activity Tolerance Patient tolerated treatment well    Behavior During Therapy [facility] for tasks assessed/performed                       Past Medical History:  Diagnosis Date   Abdominal pain, right upper quadrant    Back pain    Calculus of kidney 12/09/2013   Chronic back pain    unspecified   Chronic left shoulder pain 07/19/2015   Complication of anesthesia    Functional disorder of bladder    other   Galactorrhea 11/26/2014   Chronic    Hereditary and idiopathic neuropathy 08/19/2013   History of kidney stones    HPV test positive    Hypercholesteremia 08/19/2013   Hypertension    Incomplete bladder emptying    Microscopic hematuria    MS (multiple sclerosis) (HCC)    Muscle spasticity 05/21/2014   Nonspecific findings on examination of urine    other   Osteopenia    PONV (postoperative nausea and vomiting)    Status post laparoscopic supracervical hysterectomy 11/26/2014   Tobacco user 11/26/2014   Wrist fracture    Past Surgical History:  Procedure Laterality Date   bilateral tubal ligation  1996   BREAST CYST EXCISION Left 2002   CYST EXCISION Left 05/10/2022   Procedure: CYST REMOVAL;  Surgeon: Carolan Shiver, MD;  Location: [facility] ORS;  Service: General;  Laterality: Left;   FRACTURE SURGERY     KNEE SURGERY     right   LAPAROSCOPIC SUPRACERVICAL HYSTERECTOMY  08/05/2013   ORIF WRIST FRACTURE Left 01/17/2017   Procedure: OPEN REDUCTION  INTERNAL FIXATION (ORIF) WRIST FRACTURE;  Surgeon: Lyndle Herrlich, MD;  Location: [facility] ORS;  Service: Orthopedics;  Laterality: Left;   RADIOLOGY WITH ANESTHESIA N/A 03/18/2020   Procedure: MRI WITH ANESTHESIA CERVICAL SPINE AND BRAIN  WITH AND WITHOUT CONTRAST;  Surgeon: Radiologist, Medication, MD;  Location: [facility] OR;  Service: Radiology;  Laterality: N/A;   TUBAL LIGATION Bilateral    VAGINAL HYSTERECTOMY  03/2006   Patient Active Problem List   Diagnosis Date Noted   Hypokalemia 07/29/2022   Weakness of both lower extremities 07/29/2022   Abscess of left groin 11/15/2021   Abnormal LFTs 11/15/2021   Acute respiratory disease due to COVID-19 virus 11/21/2020   Weakness    Hypoalbuminemia due to protein-calorie malnutrition [facility])    Neurogenic bowel    Neurogenic bladder    Labile blood pressure    Neuropathic pain    Abscess of female pelvis    SVT (supraventricular tachycardia) (HCC)    Radial styloid tenosynovitis 03/12/2018   Wheelchair dependence 02/27/2018   Localized osteoporosis with current pathological fracture with routine healing 01/19/2017   Wrist fracture 01/16/2017   Sprain of ankle 03/23/2016   Closed fracture of lateral malleolus 03/16/2016   Health care maintenance 01/24/2016   Blood pressure  elevated without history of HTN 10/25/2015   Essential hypertension 10/25/2015   Multiple sclerosis (HCC) 10/02/2015   Chronic left shoulder pain 07/19/2015   Multiple sclerosis exacerbation (HCC) 07/14/2015   MS (multiple sclerosis) (HCC) 11/26/2014   Increased body mass index 11/26/2014   HPV test positive 11/26/2014   Status post laparoscopic supracervical hysterectomy 11/26/2014   Galactorrhea 11/26/2014   Back ache 05/21/2014   Adiposity 05/21/2014   Disordered sleep 05/21/2014   Muscle spasticity 05/21/2014   Spasticity 05/21/2014   Calculus of kidney 12/09/2013   Renal colic 12/09/2013   Hypercholesteremia 08/19/2013   Hereditary and idiopathic neuropathy  08/19/2013   Hypercholesterolemia without hypertriglyceridemia 08/19/2013   Bladder infection, chronic 07/25/2012   Disorder of bladder function 07/25/2012   Incomplete bladder emptying 07/25/2012   Microscopic hematuria 07/25/2012   Right upper quadrant pain 07/25/2012    ONSET DATE: 1995  REFERRING DIAG: MS, weakness, other closed fracture of proximal end of R tibia  THERAPY DIAG:  Muscle weakness (generalized)  Difficulty in walking, not elsewhere classified  Unsteadiness on feet  Rationale for Evaluation and Treatment: Rehabilitation  SUBJECTIVE:                                                                                                                                                                                             SUBJECTIVE STATEMENT: Patient has brought her cushion from home.   Pt accompanied by: self  PERTINENT HISTORY: Patient is returning to PT s/p ORIF of R tibia shaft fracture 09/23/2022. Patient has weakness in BLE with RLE>LLE. She drives with hand controls. Patient has been diagnosed with MS in 1995. PMH includes: back pain, CBP, chronic L shoulder pain, galactorrhea, neuropathy, HPV, hypercholestermeia, HTN, MS, osteopenia, PONV, wrist fracture. Additional order for other closed fracture of proximal end of R tibia with routine healing. Still has to use [employer].   PAIN:  Are you having pain? No  PRECAUTIONS: Fall  RED FLAGS: None   WEIGHT BEARING RESTRICTIONS: No  FALLS: Has patient fallen in last 6 months? Yes. Number of falls 1  LIVING ENVIRONMENT: Lives with: lives with their family Lives in: House/apartment Stairs:  ramp from garage into house Has following equipment at home: Dan Humphreys - 2 wheeled, shower chair, Grab bars, and Ramped entry  PLOF: Independent with household mobility with device  PATIENT GOALS: get up and pivot and turn, sit to stands. Return to walk, go to bathroom on her own   OBJECTIVE:  Note: Objective measures  were completed at Evaluation unless otherwise noted.  DIAGNOSTIC FINDINGS: MRI of the brain 03/18/2020 showed multiple T2/FLAIR hyperintense foci  in the periventricular, juxtacortical and deep white matter.  There were no infratentorial lesions noted.  None of the foci enhanced.   Due to severe claustrophobia, the study was done with conscious sedation in the hospital   COGNITION: Overall cognitive status: Within functional limits for tasks assessed   SENSATION: Lack of sensation in feet, loss in bilateral lateral aspect of knee  COORDINATION: Does not have the strength for functional LE heel slide test   MUSCLE TONE: n/a   POSTURE: rounded shoulders, posterior pelvic tilt, and flexed trunk   LOWER EXTREMITY ROM:     WFL grossly  LOWER EXTREMITY MMT:    MMT Right Eval Left Eval  Hip flexion 2- 2  Hip extension    Hip abduction 3+ 3+  Hip adduction 3 3  Hip internal rotation    Hip external rotation    Knee flexion 2 2+  Knee extension 2- 2  Ankle dorsiflexion 0 0  Ankle plantarflexion 0 0  Ankle inversion    Ankle eversion    (Blank rows = not tested)  BED MOBILITY:  Assess future session  TRANSFERS: Assistive device utilized: Environmental consultant - 2 wheeled  Sit to stand: CGA Stand to sit: CGA  RAMP:  Assess at future date   GAIT: Assess next session  FUNCTIONAL TESTS:  5 times sit to stand: unable to complete 2 consecutive sit to stands  10 meter walk test: perform in future session   PATIENT SURVEYS:  FOTO 34  TODAY'S TREATMENT: DATE: 03/15/23   Seated:  Sit to stand techniques:   -walker in front of patient additional cushion in chair, Two hands on walker, transfer to one hand on walker and stand. X multiple attempts ; able to reach ull stand one time.    -attempted standing with bar in front, unable to perform.    Trunk flexion/extension 10x  GTB row 10x GTB overhead press 10x Seated cross body reach for tik tak toe x3 trials   Pt requires  significant therapeutic rest breaks throughout the session due to fatigue.   PATIENT EDUCATION: Education details: goals, POC Person educated: Patient Education method: Explanation, Demonstration, Tactile cues, and Verbal cues Education comprehension: verbalized understanding, returned demonstration, verbal cues required, and tactile cues required  HOME EXERCISE PROGRAM:  To be given at next visit.   GOALS: Goals reviewed with patient? Yes  SHORT TERM GOALS: Target date: 03/06/2023  Patient will be independent in home exercise program to improve strength/mobility for better functional independence with ADLs.  Baseline: 10/1: give next session 11/5: HEP compliant Goal status: MET   LONG TERM GOALS: Target date: 05/01/2023  Patient will increase FOTO score to equal to or greater than 48 to demonstrate statistically significant improvement in mobility and quality of life  Baseline: 10/1: 34%  11/5: 39.8%  Goal status: partially met   2.  Patient (> 17 years old) will complete five times sit to stand test in < 15 seconds indicating an increased LE strength and improved balance.  Baseline: 10/1; unable to tolerate two consecutive sit to stands 11/5: 2.19.26 5 partial sit to stands  Goal status: Partially Met  3.  Patient will increase 10 meter walk test to >0.5 m/s as to improve gait speed for better community ambulation and to reduce fall risk.  Baseline: 10/1: not tested this session 11/5: unable to stand from wheelchair fully to start walking.  Goal status: Ongoing  4.  Patient will transfer independently at home and no longer require use  of [employer] for increased independence.  Baseline: 10/1: reliant upon lift 11/5: reliant upon lift still  Goal status: partially met    ASSESSMENT:  CLINICAL IMPRESSION: Patient is able to perform full sit to stand from wheelchair to walker for first time since coming to outpatient PT. Patient utilizes additional seat cushion to  help stand from a taller surface. She is highly motivated and very fatigued by end of session.  Pt will continue to benefit from skilled therapy to address remaining deficits in order to improve overall QoL and return to PLOF.      OBJECTIVE IMPAIRMENTS: Abnormal gait, decreased activity tolerance, decreased balance, decreased coordination, decreased endurance, decreased mobility, difficulty walking, decreased strength, impaired perceived functional ability, improper body mechanics, and postural dysfunction.   ACTIVITY LIMITATIONS: carrying, lifting, bending, standing, squatting, sleeping, transfers, bed mobility, bathing, toileting, dressing, reach over head, hygiene/grooming, locomotion level, and caring for others  PARTICIPATION LIMITATIONS: meal prep, cleaning, laundry, driving, shopping, community activity, yard work, and church  PERSONAL FACTORS: back pain, CBP, chronic L shoulder pain, galactorrhea, neuropathy, HPV, hypercholestermeia, HTN, MS, osteopenia, PONV, wrist fracture are also affecting patient's functional outcome.   REHAB POTENTIAL: Good  CLINICAL DECISION MAKING: Evolving/moderate complexity  EVALUATION COMPLEXITY: Moderate  PLAN:  PT FREQUENCY: 2x/week  PT DURATION: 12 weeks  PLANNED INTERVENTIONS: Therapeutic exercises, Therapeutic activity, Neuromuscular re-education, Balance training, Gait training, Patient/Family education, Self Care, Joint mobilization, Stair training, Vestibular training, Canalith repositioning, Orthotic/Fit training, DME instructions, Dry Needling, Electrical stimulation, Spinal mobilization, Cryotherapy, Moist heat, Compression bandaging, scar mobilization, Taping, Traction, Ultrasound, Manual therapy, and Re-evaluation  PLAN FOR NEXT SESSION:  Continue with standing weight shift in // bars, walking in // bars, and bed mobility if time, HEP       Precious Bard, PT, DPT Physical Therapist - [facility]   Outpatient Physical Therapy- Main Campus [phone]

## 2023-03-15 ENCOUNTER — Ambulatory Visit: Payer: PPO

## 2023-03-15 DIAGNOSIS — M6281 Muscle weakness (generalized): Secondary | ICD-10-CM

## 2023-03-15 DIAGNOSIS — R262 Difficulty in walking, not elsewhere classified: Secondary | ICD-10-CM

## 2023-03-15 DIAGNOSIS — R2681 Unsteadiness on feet: Secondary | ICD-10-CM

## 2023-03-20 ENCOUNTER — Ambulatory Visit: Payer: PPO

## 2023-03-20 DIAGNOSIS — R262 Difficulty in walking, not elsewhere classified: Secondary | ICD-10-CM

## 2023-03-20 DIAGNOSIS — M6281 Muscle weakness (generalized): Secondary | ICD-10-CM

## 2023-03-20 DIAGNOSIS — R2681 Unsteadiness on feet: Secondary | ICD-10-CM

## 2023-03-20 DIAGNOSIS — R278 Other lack of coordination: Secondary | ICD-10-CM

## 2023-03-20 DIAGNOSIS — R269 Unspecified abnormalities of gait and mobility: Secondary | ICD-10-CM

## 2023-03-20 NOTE — Therapy (Signed)
OUTPATIENT PHYSICAL THERAPY NEURO TREATMENT     Patient Name: Lisa Williams MRN: [id number] DOB:November 21, 1960, 62 y.o., female Today's Date: 03/20/2023   PCP: Lauro Regulus MD REFERRING PROVIDER: Lauro Regulus MD  END OF SESSION:  PT End of Session - 03/20/23 1400     Visit Number 12    Number of Visits 24    Date for PT Re-Evaluation 05/01/23    PT Start Time 1015    PT Stop Time 1058    PT Time Calculation (min) 43 min    Equipment Utilized During Treatment Gait belt    Activity Tolerance Patient tolerated treatment well    Behavior During Therapy [facility] for tasks assessed/performed                        Past Medical History:  Diagnosis Date   Abdominal pain, right upper quadrant    Back pain    Calculus of kidney 12/09/2013   Chronic back pain    unspecified   Chronic left shoulder pain 07/19/2015   Complication of anesthesia    Functional disorder of bladder    other   Galactorrhea 11/26/2014   Chronic    Hereditary and idiopathic neuropathy 08/19/2013   History of kidney stones    HPV test positive    Hypercholesteremia 08/19/2013   Hypertension    Incomplete bladder emptying    Microscopic hematuria    MS (multiple sclerosis) (HCC)    Muscle spasticity 05/21/2014   Nonspecific findings on examination of urine    other   Osteopenia    PONV (postoperative nausea and vomiting)    Status post laparoscopic supracervical hysterectomy 11/26/2014   Tobacco user 11/26/2014   Wrist fracture    Past Surgical History:  Procedure Laterality Date   bilateral tubal ligation  1996   BREAST CYST EXCISION Left 2002   CYST EXCISION Left 05/10/2022   Procedure: CYST REMOVAL;  Surgeon: Carolan Shiver, MD;  Location: [facility] ORS;  Service: General;  Laterality: Left;   FRACTURE SURGERY     KNEE SURGERY     right   LAPAROSCOPIC SUPRACERVICAL HYSTERECTOMY  08/05/2013   ORIF WRIST FRACTURE Left 01/17/2017   Procedure: OPEN REDUCTION  INTERNAL FIXATION (ORIF) WRIST FRACTURE;  Surgeon: Lyndle Herrlich, MD;  Location: [facility] OR[facility];  Service: Orthopedics;  Laterality: Left;   RADIOLOGY WITH ANESTHESIA N/A 03/18/2020   Procedure: MRI WITH ANESTHESIA CERVICAL SPINE AND BRAIN  WITH AND WITHOUT CONTRAST;  Surgeon: Radiologist, Medication, MD;  Location: [facility] OR;  Service: Radiology;  Laterality: N/A;   TUBAL LIGATION Bilateral    VAGINAL HYSTERECTOMY  03/2006   Patient Active Problem List   Diagnosis Date Noted   Hypokalemia 07/29/2022   Weakness of both lower extremities 07/29/2022   Abscess of left groin 11/15/2021   Abnormal LFTs 11/15/2021   Acute respiratory disease due to COVID-19 virus 11/21/2020   Weakness    Hypoalbuminemia due to protein-calorie malnutrition [facility])    Neurogenic bowel    Neurogenic bladder    Labile blood pressure    Neuropathic pain    Abscess of female pelvis    SVT (supraventricular tachycardia) (HCC)    Radial styloid tenosynovitis 03/12/2018   Wheelchair dependence 02/27/2018   Localized osteoporosis with current pathological fracture with routine healing 01/19/2017   Wrist fracture 01/16/2017   Sprain of ankle 03/23/2016   Closed fracture of lateral malleolus 03/16/2016   Health care maintenance 01/24/2016   Blood  pressure elevated without history of HTN 10/25/2015   Essential hypertension 10/25/2015   Multiple sclerosis (HCC) 10/02/2015   Chronic left shoulder pain 07/19/2015   Multiple sclerosis exacerbation (HCC) 07/14/2015   MS (multiple sclerosis) (HCC) 11/26/2014   Increased body mass index 11/26/2014   HPV test positive 11/26/2014   Status post laparoscopic supracervical hysterectomy 11/26/2014   Galactorrhea 11/26/2014   Back ache 05/21/2014   Adiposity 05/21/2014   Disordered sleep 05/21/2014   Muscle spasticity 05/21/2014   Spasticity 05/21/2014   Calculus of kidney 12/09/2013   Renal colic 12/09/2013   Hypercholesteremia 08/19/2013   Hereditary and idiopathic neuropathy  08/19/2013   Hypercholesterolemia without hypertriglyceridemia 08/19/2013   Bladder infection, chronic 07/25/2012   Disorder of bladder function 07/25/2012   Incomplete bladder emptying 07/25/2012   Microscopic hematuria 07/25/2012   Right upper quadrant pain 07/25/2012    ONSET DATE: 1995  REFERRING DIAG: MS, weakness, other closed fracture of proximal end of R tibia  THERAPY DIAG:  Muscle weakness (generalized)  Difficulty in walking, not elsewhere classified  Unsteadiness on feet  Abnormality of gait and mobility  Other lack of coordination  Rationale for Evaluation and Treatment: Rehabilitation  SUBJECTIVE:                                                                                                                                                                                             SUBJECTIVE STATEMENT: Patient reports there have been no changes since her last visit.   Pt accompanied by: self  PERTINENT HISTORY: Patient is returning to PT s/p ORIF of R tibia shaft fracture 09/23/2022. Patient has weakness in BLE with RLE>LLE. She drives with hand controls. Patient has been diagnosed with MS in 1995. PMH includes: back pain, CBP, chronic L shoulder pain, galactorrhea, neuropathy, HPV, hypercholestermeia, HTN, MS, osteopenia, PONV, wrist fracture. Additional order for other closed fracture of proximal end of R tibia with routine healing. Still has to use [employer].   PAIN:  Are you having pain? No  PRECAUTIONS: Fall  RED FLAGS: None   WEIGHT BEARING RESTRICTIONS: No  FALLS: Has patient fallen in last 6 months? Yes. Number of falls 1  LIVING ENVIRONMENT: Lives with: lives with their family Lives in: House/apartment Stairs:  ramp from garage into house Has following equipment at home: Dan Humphreys - 2 wheeled, shower chair, Grab bars, and Ramped entry  PLOF: Independent with household mobility with device  PATIENT GOALS: get up and pivot and turn, sit to  stands. Return to walk, go to bathroom on her own   OBJECTIVE:  Note: Objective measures were completed at Evaluation  unless otherwise noted.  DIAGNOSTIC FINDINGS: MRI of the brain 03/18/2020 showed multiple T2/FLAIR hyperintense foci in the periventricular, juxtacortical and deep white matter.  There were no infratentorial lesions noted.  None of the foci enhanced.   Due to severe claustrophobia, the study was done with conscious sedation in the hospital   COGNITION: Overall cognitive status: Within functional limits for tasks assessed   SENSATION: Lack of sensation in feet, loss in bilateral lateral aspect of knee  COORDINATION: Does not have the strength for functional LE heel slide test   MUSCLE TONE: n/a   POSTURE: rounded shoulders, posterior pelvic tilt, and flexed trunk   LOWER EXTREMITY ROM:     WFL grossly  LOWER EXTREMITY MMT:    MMT Right Eval Left Eval  Hip flexion 2- 2  Hip extension    Hip abduction 3+ 3+  Hip adduction 3 3  Hip internal rotation    Hip external rotation    Knee flexion 2 2+  Knee extension 2- 2  Ankle dorsiflexion 0 0  Ankle plantarflexion 0 0  Ankle inversion    Ankle eversion    (Blank rows = not tested)  BED MOBILITY:  Assess future session  TRANSFERS: Assistive device utilized: Environmental consultant - 2 wheeled  Sit to stand: CGA Stand to sit: CGA  RAMP:  Assess at future date   GAIT: Assess next session  FUNCTIONAL TESTS:  5 times sit to stand: unable to complete 2 consecutive sit to stands  10 meter walk test: perform in future session   PATIENT SURVEYS:  FOTO 34  TODAY'S TREATMENT: DATE: 03/20/23 All activities performed with gait belt and CGA unless otherwise noted.   - In hallway -Patient attempted to perform STS from wheelchair with elevated height (cushion) to standing with two wheeled Suleyma Wafer x5, patient able to achieve near upright posture, but unable to bring BUE from chair to Judeen Geralds due to heavy reliance throughout  transfer    In // bars  -Patient performed STS from wheelchair with elevated height (cushion) and using UE to assist on parallel bars x2 - Patient ambulated the distance of the parallel bars without assist on left LE (sock to decrease friction) x1.5  -attempted to perform again, but patient with increased fatigue and trembling of entire body after   - Seated hamstring curls with assist from slip disc (reduce friction) and SPT  - Seated cross body punches 3x20 - Seated Upper cut punches 3x20   Pt requires significant therapeutic rest breaks throughout the session due to fatigue.   PATIENT EDUCATION: Education details: goals, POC Person educated: Patient Education method: Explanation, Demonstration, Tactile cues, and Verbal cues Education comprehension: verbalized understanding, returned demonstration, verbal cues required, and tactile cues required  HOME EXERCISE PROGRAM:  To be given at next visit.   GOALS: Goals reviewed with patient? Yes  SHORT TERM GOALS: Target date: 03/06/2023  Patient will be independent in home exercise program to improve strength/mobility for better functional independence with ADLs.  Baseline: 10/1: give next session 11/5: HEP compliant Goal status: MET   LONG TERM GOALS: Target date: 05/01/2023  Patient will increase FOTO score to equal to or greater than 48 to demonstrate statistically significant improvement in mobility and quality of life  Baseline: 10/1: 34%  11/5: 39.8%  Goal status: partially met   2.  Patient (> 22 years old) will complete five times sit to stand test in < 15 seconds indicating an increased LE strength and improved balance.  Baseline: 10/1;  unable to tolerate two consecutive sit to stands 11/5: 2.19.26 5 partial sit to stands  Goal status: Partially Met  3.  Patient will increase 10 meter walk test to >0.5 m/s as to improve gait speed for better community ambulation and to reduce fall risk.  Baseline: 10/1: not tested  this session 11/5: unable to stand from wheelchair fully to start walking.  Goal status: Ongoing  4.  Patient will transfer independently at home and no longer require use of Sara Plus Lift for increased independence.  Baseline: 10/1: reliant upon lift 11/5: reliant upon lift still  Goal status: partially met    ASSESSMENT:  CLINICAL IMPRESSION: Patient is able to ambulate in parallel bars without any assistance on the left LE for the first time since visiting outpatient PT.  Patient utilizes additional seat cushion to help stand from a taller surface. As she increases her LE strength, she will be able to perform transfers without heavy use of upper extremities. She is highly motivated and very fatigued by end of session.  Pt will continue to benefit from skilled therapy to address remaining deficits in order to improve overall QoL and return to PLOF.     OBJECTIVE IMPAIRMENTS: Abnormal gait, decreased activity tolerance, decreased balance, decreased coordination, decreased endurance, decreased mobility, difficulty walking, decreased strength, impaired perceived functional ability, improper body mechanics, and postural dysfunction.   ACTIVITY LIMITATIONS: carrying, lifting, bending, standing, squatting, sleeping, transfers, bed mobility, bathing, toileting, dressing, reach over head, hygiene/grooming, locomotion level, and caring for others  PARTICIPATION LIMITATIONS: meal prep, cleaning, laundry, driving, shopping, community activity, yard work, and church  PERSONAL FACTORS: back pain, CBP, chronic L shoulder pain, galactorrhea, neuropathy, HPV, hypercholestermeia, HTN, MS, osteopenia, PONV, wrist fracture are also affecting patient's functional outcome.   REHAB POTENTIAL: Good  CLINICAL DECISION MAKING: Evolving/moderate complexity  EVALUATION COMPLEXITY: Moderate  PLAN:  PT FREQUENCY: 2x/week  PT DURATION: 12 weeks  PLANNED INTERVENTIONS: Therapeutic exercises, Therapeutic  activity, Neuromuscular re-education, Balance training, Gait training, Patient/Family education, Self Care, Joint mobilization, Stair training, Vestibular training, Canalith repositioning, Orthotic/Fit training, DME instructions, Dry Needling, Electrical stimulation, Spinal mobilization, Cryotherapy, Moist heat, Compression bandaging, scar mobilization, Taping, Traction, Ultrasound, Manual therapy, and Re-evaluation  PLAN FOR NEXT SESSION:  Continue with standing weight shift in // bars, walking in // bars, and bed mobility if time, HEP    Randon Goldsmith, Student-PT This entire session was performed under direct supervision and direction of a licensed Estate agent . I have personally read, edited and approve of the note as written.   Precious Bard, PT, DPT Physical Therapist - [place] [facility]  Outpatient Physical Therapy- Main Campus [phone]

## 2023-03-22 ENCOUNTER — Ambulatory Visit: Payer: PPO

## 2023-03-22 DIAGNOSIS — R269 Unspecified abnormalities of gait and mobility: Secondary | ICD-10-CM

## 2023-03-22 DIAGNOSIS — R2681 Unsteadiness on feet: Secondary | ICD-10-CM

## 2023-03-22 DIAGNOSIS — M6281 Muscle weakness (generalized): Secondary | ICD-10-CM

## 2023-03-22 DIAGNOSIS — R278 Other lack of coordination: Secondary | ICD-10-CM

## 2023-03-22 DIAGNOSIS — R262 Difficulty in walking, not elsewhere classified: Secondary | ICD-10-CM

## 2023-03-22 NOTE — Therapy (Signed)
OUTPATIENT PHYSICAL THERAPY NEURO TREATMENT     Patient Name: Lisa Williams MRN: [id number] DOB:[birth year], 62 y.o., female Today's Date: 03/22/2023   PCP: Lauro Regulus MD REFERRING PROVIDER: Lauro Regulus MD  END OF SESSION:  PT End of Session - 03/22/23 1012     Visit Number 13    Number of Visits 24    Date for PT Re-Evaluation 05/01/23    PT Start Time 1015    PT Stop Time 1059    PT Time Calculation (min) 44 min    Equipment Utilized During Treatment Gait belt    Activity Tolerance Patient tolerated treatment well    Behavior During Therapy [facility] for tasks assessed/performed                         Past Medical History:  Diagnosis Date   Abdominal pain, right upper quadrant    Back pain    Calculus of kidney 12/09/2013   Chronic back pain    unspecified   Chronic left shoulder pain 07/19/2015   Complication of anesthesia    Functional disorder of bladder    other   Galactorrhea 11/26/2014   Chronic    Hereditary and idiopathic neuropathy 08/19/2013   History of kidney stones    HPV test positive    Hypercholesteremia 08/19/2013   Hypertension    Incomplete bladder emptying    Microscopic hematuria    MS (multiple sclerosis) (HCC)    Muscle spasticity 05/21/2014   Nonspecific findings on examination of urine    other   Osteopenia    PONV (postoperative nausea and vomiting)    Status post laparoscopic supracervical hysterectomy 11/26/2014   Tobacco user 11/26/2014   Wrist fracture    Past Surgical History:  Procedure Laterality Date   bilateral tubal ligation  1996   BREAST CYST EXCISION Left 2002   CYST EXCISION Left 05/10/2022   Procedure: CYST REMOVAL;  Surgeon: Carolan Shiver, MD;  Location: [facility] OR[facility];  Service: General;  Laterality: Left;   FRACTURE SURGERY     KNEE SURGERY     right   LAPAROSCOPIC SUPRACERVICAL HYSTERECTOMY  08/05/2013   ORIF WRIST FRACTURE Left 01/17/2017   Procedure: OPEN  REDUCTION INTERNAL FIXATION (ORIF) WRIST FRACTURE;  Surgeon: Lyndle Herrlich, MD;  Location: [facility] OR[facility];  Service: Orthopedics;  Laterality: Left;   RADIOLOGY WITH ANESTHESIA N/A 03/18/2020   Procedure: MRI WITH ANESTHESIA CERVICAL SPINE AND BRAIN  WITH AND WITHOUT CONTRAST;  Surgeon: Radiologist, Medication, MD;  Location: [facility] OR;  Service: Radiology;  Laterality: N/A;   TUBAL LIGATION Bilateral    VAGINAL HYSTERECTOMY  03/2006   Patient Active Problem List   Diagnosis Date Noted   Hypokalemia 07/29/2022   Weakness of both lower extremities 07/29/2022   Abscess of left groin 11/15/2021   Abnormal LFTs 11/15/2021   Acute respiratory disease due to COVID-19 virus 11/21/2020   Weakness    Hypoalbuminemia due to protein-calorie malnutrition [facility])    Neurogenic bowel    Neurogenic bladder    Labile blood pressure    Neuropathic pain    Abscess of female pelvis    SVT (supraventricular tachycardia) (HCC)    Radial styloid tenosynovitis 03/12/2018   Wheelchair dependence 02/27/2018   Localized osteoporosis with current pathological fracture with routine healing 01/19/2017   Wrist fracture 01/16/2017   Sprain of ankle 03/23/2016   Closed fracture of lateral malleolus 03/16/2016   Health care maintenance 01/24/2016  Blood pressure elevated without history of HTN 10/25/2015   Essential hypertension 10/25/2015   Multiple sclerosis (HCC) 10/02/2015   Chronic left shoulder pain 07/19/2015   Multiple sclerosis exacerbation (HCC) 07/14/2015   MS (multiple sclerosis) (HCC) 11/26/2014   Increased body mass index 11/26/2014   HPV test positive 11/26/2014   Status post laparoscopic supracervical hysterectomy 11/26/2014   Galactorrhea 11/26/2014   Back ache 05/21/2014   Adiposity 05/21/2014   Disordered sleep 05/21/2014   Muscle spasticity 05/21/2014   Spasticity 05/21/2014   Calculus of kidney 12/09/2013   Renal colic 12/09/2013   Hypercholesteremia 08/19/2013   Hereditary and idiopathic  neuropathy 08/19/2013   Hypercholesterolemia without hypertriglyceridemia 08/19/2013   Bladder infection, chronic 07/25/2012   Disorder of bladder function 07/25/2012   Incomplete bladder emptying 07/25/2012   Microscopic hematuria 07/25/2012   Right upper quadrant pain 07/25/2012    ONSET DATE: 1995  REFERRING DIAG: MS, weakness, other closed fracture of proximal end of R tibia  THERAPY DIAG:  Muscle weakness (generalized)  Difficulty in walking, not elsewhere classified  Unsteadiness on feet  Other lack of coordination  Abnormality of gait and mobility  Rationale for Evaluation and Treatment: Rehabilitation  SUBJECTIVE:                                                                                                                                                                                             SUBJECTIVE STATEMENT: Patient reports there have been no changes since her last visit.   Pt accompanied by: self  PERTINENT HISTORY: Patient is returning to PT s/p ORIF of R tibia shaft fracture 09/23/2022. Patient has weakness in BLE with RLE>LLE. She drives with hand controls. Patient has been diagnosed with MS in 1995. PMH includes: back pain, CBP, chronic L shoulder pain, galactorrhea, neuropathy, HPV, hypercholestermeia, HTN, MS, osteopenia, PONV, wrist fracture. Additional order for other closed fracture of proximal end of R tibia with routine healing. Still has to use [employer].   PAIN:  Are you having pain? No  PRECAUTIONS: Fall  RED FLAGS: None   WEIGHT BEARING RESTRICTIONS: No  FALLS: Has patient fallen in last 6 months? Yes. Number of falls 1  LIVING ENVIRONMENT: Lives with: lives with their family Lives in: House/apartment Stairs:  ramp from garage into house Has following equipment at home: Dan Humphreys - 2 wheeled, shower chair, Grab bars, and Ramped entry  PLOF: Independent with household mobility with device  PATIENT GOALS: get up and pivot and turn,  sit to stands. Return to walk, go to bathroom on her own   OBJECTIVE:  Note: Objective measures were completed at  Evaluation unless otherwise noted.  DIAGNOSTIC FINDINGS: MRI of the brain 03/18/2020 showed multiple T2/FLAIR hyperintense foci in the periventricular, juxtacortical and deep white matter.  There were no infratentorial lesions noted.  None of the foci enhanced.   Due to severe claustrophobia, the study was done with conscious sedation in the hospital   COGNITION: Overall cognitive status: Within functional limits for tasks assessed   SENSATION: Lack of sensation in feet, loss in bilateral lateral aspect of knee  COORDINATION: Does not have the strength for functional LE heel slide test   MUSCLE TONE: n/a   POSTURE: rounded shoulders, posterior pelvic tilt, and flexed trunk   LOWER EXTREMITY ROM:     WFL grossly  LOWER EXTREMITY MMT:    MMT Right Eval Left Eval  Hip flexion 2- 2  Hip extension    Hip abduction 3+ 3+  Hip adduction 3 3  Hip internal rotation    Hip external rotation    Knee flexion 2 2+  Knee extension 2- 2  Ankle dorsiflexion 0 0  Ankle plantarflexion 0 0  Ankle inversion    Ankle eversion    (Blank rows = not tested)  BED MOBILITY:  Assess future session  TRANSFERS: Assistive device utilized: Environmental consultant - 2 wheeled  Sit to stand: CGA Stand to sit: CGA  RAMP:  Assess at future date   GAIT: Assess next session  FUNCTIONAL TESTS:  5 times sit to stand: unable to complete 2 consecutive sit to stands  10 meter walk test: perform in future session   PATIENT SURVEYS:  FOTO 34  TODAY'S TREATMENT: DATE: 03/22/23 All activities performed with gait belt and CGA unless otherwise noted.   -Patient performed STS from wheelchair with elevated height (cushion) to two wheeled Dyllen Menning x4, first attempt patient unable to achieve upright posture, but remaining three trials patient able to stand upright and bring hands from wheelchair to Zollie Clemence   -Patient ambulated four steps forwards and back with two wheeled Damonie Ellenwood x2, cues to weight shift onto RLE before attempting to bring LLE forward, attempted a third repetition but pt with increased fatigue  - Seated hamstring curls with assist from slip disc (reduce friction) and SPT 2x10 each LE - Seated hip abduction 2x10  Pt requires significant therapeutic rest breaks throughout the session due to fatigue.   PATIENT EDUCATION: Education details: goals, POC Person educated: Patient Education method: Explanation, Demonstration, Tactile cues, and Verbal cues Education comprehension: verbalized understanding, returned demonstration, verbal cues required, and tactile cues required  HOME EXERCISE PROGRAM:  To be given at next visit.   GOALS: Goals reviewed with patient? Yes  SHORT TERM GOALS: Target date: 03/06/2023  Patient will be independent in home exercise program to improve strength/mobility for better functional independence with ADLs.  Baseline: 10/1: give next session 11/5: HEP compliant Goal status: MET   LONG TERM GOALS: Target date: 05/01/2023  Patient will increase FOTO score to equal to or greater than 48 to demonstrate statistically significant improvement in mobility and quality of life  Baseline: 10/1: 34%  11/5: 39.8%  Goal status: partially met   2.  Patient (> 65 years old) will complete five times sit to stand test in < 15 seconds indicating an increased LE strength and improved balance.  Baseline: 10/1; unable to tolerate two consecutive sit to stands 11/5: 2.19.26 5 partial sit to stands  Goal status: Partially Met  3.  Patient will increase 10 meter walk test to >0.5 m/s as to improve gait  speed for better community ambulation and to reduce fall risk.  Baseline: 10/1: not tested this session 11/5: unable to stand from wheelchair fully to start walking.  Goal status: Ongoing  4.  Patient will transfer independently at home and no longer require use of  Sara Plus Lift for increased independence.  Baseline: 10/1: reliant upon lift 11/5: reliant upon lift still  Goal status: partially met    ASSESSMENT:  CLINICAL IMPRESSION: Patient presents to therapy highly motivated and making progress towards her goals. She is able to stand from her wheelchair and ambulate with a rolling Shyleigh Daughtry for the first since beginning therapy. She performed sit to stand transfer with decreased use of UE indicating improvements in LE strength. She requires increased time for rest breaks and is fatigued at end of session. Pt will continue to benefit from skilled therapy to address remaining deficits in order to improve overall QoL and return to PLOF.     OBJECTIVE IMPAIRMENTS: Abnormal gait, decreased activity tolerance, decreased balance, decreased coordination, decreased endurance, decreased mobility, difficulty walking, decreased strength, impaired perceived functional ability, improper body mechanics, and postural dysfunction.   ACTIVITY LIMITATIONS: carrying, lifting, bending, standing, squatting, sleeping, transfers, bed mobility, bathing, toileting, dressing, reach over head, hygiene/grooming, locomotion level, and caring for others  PARTICIPATION LIMITATIONS: meal prep, cleaning, laundry, driving, shopping, community activity, yard work, and church  PERSONAL FACTORS: back pain, CBP, chronic L shoulder pain, galactorrhea, neuropathy, HPV, hypercholestermeia, HTN, MS, osteopenia, PONV, wrist fracture are also affecting patient's functional outcome.   REHAB POTENTIAL: Good  CLINICAL DECISION MAKING: Evolving/moderate complexity  EVALUATION COMPLEXITY: Moderate  PLAN:  PT FREQUENCY: 2x/week  PT DURATION: 12 weeks  PLANNED INTERVENTIONS: Therapeutic exercises, Therapeutic activity, Neuromuscular re-education, Balance training, Gait training, Patient/Family education, Self Care, Joint mobilization, Stair training, Vestibular training, Canalith repositioning,  Orthotic/Fit training, DME instructions, Dry Needling, Electrical stimulation, Spinal mobilization, Cryotherapy, Moist heat, Compression bandaging, scar mobilization, Taping, Traction, Ultrasound, Manual therapy, and Re-evaluation  PLAN FOR NEXT SESSION:  Gait training, LE strengthening, HEP as appropriate    Randon Goldsmith, Student-PT This entire session was performed under direct supervision and direction of a licensed Estate agent . I have personally read, edited and approve of the note as written.   Precious Bard, PT, DPT Physical Therapist - [facility]  Outpatient Physical Therapy- Main Campus [phone]

## 2023-03-26 ENCOUNTER — Other Ambulatory Visit: Payer: Self-pay | Admitting: Neurology

## 2023-03-26 DIAGNOSIS — G35 Multiple sclerosis: Secondary | ICD-10-CM

## 2023-03-26 NOTE — Therapy (Incomplete)
OUTPATIENT PHYSICAL THERAPY NEURO TREATMENT     Patient Name: Lisa Williams MRN: [id number] DOB:December 20, 1960, 62 y.o., female Today's Date: 03/27/2023   PCP: Lauro Regulus MD REFERRING PROVIDER: Lauro Regulus MD  END OF SESSION:  PT End of Session - 03/27/23 1008     Visit Number 14    Number of Visits 24    Date for PT Re-Evaluation 05/01/23    Equipment Utilized During Treatment Gait belt    Activity Tolerance Patient tolerated treatment well    Behavior During Therapy [facility] for tasks assessed/performed                          Past Medical History:  Diagnosis Date   Abdominal pain, right upper quadrant    Back pain    Calculus of kidney 12/09/2013   Chronic back pain    unspecified   Chronic left shoulder pain 07/19/2015   Complication of anesthesia    Functional disorder of bladder    other   Galactorrhea 11/26/2014   Chronic    Hereditary and idiopathic neuropathy 08/19/2013   History of kidney stones    HPV test positive    Hypercholesteremia 08/19/2013   Hypertension    Incomplete bladder emptying    Microscopic hematuria    MS (multiple sclerosis) (HCC)    Muscle spasticity 05/21/2014   Nonspecific findings on examination of urine    other   Osteopenia    PONV (postoperative nausea and vomiting)    Status post laparoscopic supracervical hysterectomy 11/26/2014   Tobacco user 11/26/2014   Wrist fracture    Past Surgical History:  Procedure Laterality Date   bilateral tubal ligation  1996   BREAST CYST EXCISION Left 2002   CYST EXCISION Left 05/10/2022   Procedure: CYST REMOVAL;  Surgeon: Carolan Shiver, MD;  Location: [facility] OR[facility];  Service: General;  Laterality: Left;   FRACTURE SURGERY     KNEE SURGERY     right   LAPAROSCOPIC SUPRACERVICAL HYSTERECTOMY  08/05/2013   ORIF WRIST FRACTURE Left 01/17/2017   Procedure: OPEN REDUCTION INTERNAL FIXATION (ORIF) WRIST FRACTURE;  Surgeon: Lyndle Herrlich, MD;   Location: [facility];  Service: Orthopedics;  Laterality: Left;   RADIOLOGY WITH ANESTHESIA N/A 03/18/2020   Procedure: MRI WITH ANESTHESIA CERVICAL SPINE AND BRAIN  WITH AND WITHOUT CONTRAST;  Surgeon: Radiologist, Medication, MD;  Location: [facility] OR;  Service: Radiology;  Laterality: N/A;   TUBAL LIGATION Bilateral    VAGINAL HYSTERECTOMY  03/2006   Patient Active Problem List   Diagnosis Date Noted   Hypokalemia 07/29/2022   Weakness of both lower extremities 07/29/2022   Abscess of left groin 11/15/2021   Abnormal LFTs 11/15/2021   Acute respiratory disease due to COVID-19 virus 11/21/2020   Weakness    Hypoalbuminemia due to protein-calorie malnutrition [facility])    Neurogenic bowel    Neurogenic bladder    Labile blood pressure    Neuropathic pain    Abscess of female pelvis    SVT (supraventricular tachycardia) (HCC)    Radial styloid tenosynovitis 03/12/2018   Wheelchair dependence 02/27/2018   Localized osteoporosis with current pathological fracture with routine healing 01/19/2017   Wrist fracture 01/16/2017   Sprain of ankle 03/23/2016   Closed fracture of lateral malleolus 03/16/2016   Health care maintenance 01/24/2016   Blood pressure elevated without history of HTN 10/25/2015   Essential hypertension 10/25/2015   Multiple sclerosis (HCC) 10/02/2015   Chronic  left shoulder pain 07/19/2015   Multiple sclerosis exacerbation (HCC) 07/14/2015   MS (multiple sclerosis) (HCC) 11/26/2014   Increased body mass index 11/26/2014   HPV test positive 11/26/2014   Status post laparoscopic supracervical hysterectomy 11/26/2014   Galactorrhea 11/26/2014   Back ache 05/21/2014   Adiposity 05/21/2014   Disordered sleep 05/21/2014   Muscle spasticity 05/21/2014   Spasticity 05/21/2014   Calculus of kidney 12/09/2013   Renal colic 12/09/2013   Hypercholesteremia 08/19/2013   Hereditary and idiopathic neuropathy 08/19/2013   Hypercholesterolemia without hypertriglyceridemia 08/19/2013    Bladder infection, chronic 07/25/2012   Disorder of bladder function 07/25/2012   Incomplete bladder emptying 07/25/2012   Microscopic hematuria 07/25/2012   Right upper quadrant pain 07/25/2012    ONSET DATE: 1995  REFERRING DIAG: MS, weakness, other closed fracture of proximal end of R tibia  THERAPY DIAG:  Muscle weakness (generalized)  Difficulty in walking, not elsewhere classified  Unsteadiness on feet  Other lack of coordination  Abnormality of gait and mobility  Rationale for Evaluation and Treatment: Rehabilitation  SUBJECTIVE:                                                                                                                                                                                             SUBJECTIVE STATEMENT: Patient reports there have been no changes since her last visit.   Pt accompanied by: self  PERTINENT HISTORY: Patient is returning to PT s/p ORIF of R tibia shaft fracture 09/23/2022. Patient has weakness in BLE with RLE>LLE. She drives with hand controls. Patient has been diagnosed with MS in 1995. PMH includes: back pain, CBP, chronic L shoulder pain, galactorrhea, neuropathy, HPV, hypercholestermeia, HTN, MS, osteopenia, PONV, wrist fracture. Additional order for other closed fracture of proximal end of R tibia with routine healing. Still has to use [employer].   PAIN:  Are you having pain? No  PRECAUTIONS: Fall  RED FLAGS: None   WEIGHT BEARING RESTRICTIONS: No  FALLS: Has patient fallen in last 6 months? Yes. Number of falls 1  LIVING ENVIRONMENT: Lives with: lives with their family Lives in: House/apartment Stairs:  ramp from garage into house Has following equipment at home: Dan Humphreys - 2 wheeled, shower chair, Grab bars, and Ramped entry  PLOF: Independent with household mobility with device  PATIENT GOALS: get up and pivot and turn, sit to stands. Return to walk, go to bathroom on her own   OBJECTIVE:  Note:  Objective measures were completed at Evaluation unless otherwise noted.  DIAGNOSTIC FINDINGS: MRI of the brain 03/18/2020 showed multiple T2/FLAIR hyperintense foci in the periventricular, juxtacortical and  deep white matter.  There were no infratentorial lesions noted.  None of the foci enhanced.   Due to severe claustrophobia, the study was done with conscious sedation in the hospital   COGNITION: Overall cognitive status: Within functional limits for tasks assessed   SENSATION: Lack of sensation in feet, loss in bilateral lateral aspect of knee  COORDINATION: Does not have the strength for functional LE heel slide test   MUSCLE TONE: n/a   POSTURE: rounded shoulders, posterior pelvic tilt, and flexed trunk   LOWER EXTREMITY ROM:     WFL grossly  LOWER EXTREMITY MMT:    MMT Right Eval Left Eval  Hip flexion 2- 2  Hip extension    Hip abduction 3+ 3+  Hip adduction 3 3  Hip internal rotation    Hip external rotation    Knee flexion 2 2+  Knee extension 2- 2  Ankle dorsiflexion 0 0  Ankle plantarflexion 0 0  Ankle inversion    Ankle eversion    (Blank rows = not tested)  BED MOBILITY:  Assess future session  TRANSFERS: Assistive device utilized: Environmental consultant - 2 wheeled  Sit to stand: CGA Stand to sit: CGA  RAMP:  Assess at future date   GAIT: Assess next session  FUNCTIONAL TESTS:  5 times sit to stand: unable to complete 2 consecutive sit to stands  10 meter walk test: perform in future session   PATIENT SURVEYS:  FOTO 34  TODAY'S TREATMENT: DATE: 03/27/23 All activities performed with gait belt and CGA unless otherwise noted.   -Patient performed STS from wheelchair with elevated height (cushion) to two wheeled Daryll Spisak, patient attempted twice where she was able to achieve full upright but unable to decrease UE support to bring hands from wheelchair to Merrianne Mccumbers, on third attempt pt able to decrease UE support and bring both UE to Genna Casimir -Patient ambulated  six steps forwards in the open hallway with two wheeled Greggory Safranek, cues to weight shift onto RLE before attempting to bring LLE forward, attempted a second repetition but pt with increased fatigue  - Seated hamstring curls with assist from slip disc (reduce friction) and SPT 2x10 each LE - Seated basketball x25 shots, focusing on weight shifting, core, and sitting balance -Seated cross body punches 2x25 -Seated uppercut punches 1x20 -Seated cross body punches followed by pt ducking head 1x25  Pt requires significant therapeutic rest breaks throughout the session due to fatigue.  PATIENT EDUCATION: Education details: goals, POC Person educated: Patient Education method: Explanation, Demonstration, Tactile cues, and Verbal cues Education comprehension: verbalized understanding, returned demonstration, verbal cues required, and tactile cues required  HOME EXERCISE PROGRAM:  To be given at next visit.   GOALS: Goals reviewed with patient? Yes  SHORT TERM GOALS: Target date: 03/06/2023  Patient will be independent in home exercise program to improve strength/mobility for better functional independence with ADLs.  Baseline: 10/1: give next session 11/5: HEP compliant Goal status: MET   LONG TERM GOALS: Target date: 05/01/2023  Patient will increase FOTO score to equal to or greater than 48 to demonstrate statistically significant improvement in mobility and quality of life  Baseline: 10/1: 34%  11/5: 39.8%  Goal status: partially met   2.  Patient (> 68 years old) will complete five times sit to stand test in < 15 seconds indicating an increased LE strength and improved balance.  Baseline: 10/1; unable to tolerate two consecutive sit to stands 11/5: 2.19.26 5 partial sit to stands  Goal status: Partially  Met  3.  Patient will increase 10 meter walk test to >0.5 m/s as to improve gait speed for better community ambulation and to reduce fall risk.  Baseline: 10/1: not tested this  session 11/5: unable to stand from wheelchair fully to start walking.  Goal status: Ongoing  4.  Patient will transfer independently at home and no longer require use of [employer] Plus Lift for increased independence.  Baseline: 10/1: reliant upon lift 11/5: reliant upon lift still  Goal status: partially met    ASSESSMENT:  CLINICAL IMPRESSION: Patient presents to therapy highly motivated and making progress towards her goals. Patient was able to stand from her wheelchair and ambulate six steps in the open hallway which is the furthest she has walked in outpatient PT. She is requiring less assistance from SPT for her seated hamstring curls demonstrating improvement in LE strength. With dynamic sitting activities, she is engaging her core with no episodes of LOB. She requires increased time for rest breaks and is fatigued at end of session. Pt will continue to benefit from skilled therapy to address remaining deficits in order to improve overall QoL and return to PLOF.     OBJECTIVE IMPAIRMENTS: Abnormal gait, decreased activity tolerance, decreased balance, decreased coordination, decreased endurance, decreased mobility, difficulty walking, decreased strength, impaired perceived functional ability, improper body mechanics, and postural dysfunction.   ACTIVITY LIMITATIONS: carrying, lifting, bending, standing, squatting, sleeping, transfers, bed mobility, bathing, toileting, dressing, reach over head, hygiene/grooming, locomotion level, and caring for others  PARTICIPATION LIMITATIONS: meal prep, cleaning, laundry, driving, shopping, community activity, yard work, and church  PERSONAL FACTORS: back pain, CBP, chronic L shoulder pain, galactorrhea, neuropathy, HPV, hypercholestermeia, HTN, MS, osteopenia, PONV, wrist fracture are also affecting patient's functional outcome.   REHAB POTENTIAL: Good  CLINICAL DECISION MAKING: Evolving/moderate complexity  EVALUATION COMPLEXITY:  Moderate  PLAN:  PT FREQUENCY: 2x/week  PT DURATION: 12 weeks  PLANNED INTERVENTIONS: Therapeutic exercises, Therapeutic activity, Neuromuscular re-education, Balance training, Gait training, Patient/Family education, Self Care, Joint mobilization, Stair training, Vestibular training, Canalith repositioning, Orthotic/Fit training, DME instructions, Dry Needling, Electrical stimulation, Spinal mobilization, Cryotherapy, Moist heat, Compression bandaging, scar mobilization, Taping, Traction, Ultrasound, Manual therapy, and Re-evaluation  PLAN FOR NEXT SESSION:  Gait training, LE strengthening, HEP as appropriate   Randon Goldsmith, Student-PT This entire session was performed under direct supervision and direction of a licensed Estate agent . I have personally read, edited and approve of the note as written.   Precious Bard, PT, DPT Physical Therapist - [place] [facility]  Outpatient Physical Therapy- Main Campus [phone]

## 2023-03-27 ENCOUNTER — Ambulatory Visit: Payer: PPO

## 2023-03-27 DIAGNOSIS — R269 Unspecified abnormalities of gait and mobility: Secondary | ICD-10-CM

## 2023-03-27 DIAGNOSIS — M6281 Muscle weakness (generalized): Secondary | ICD-10-CM | POA: Diagnosis not present

## 2023-03-27 DIAGNOSIS — R278 Other lack of coordination: Secondary | ICD-10-CM

## 2023-03-27 DIAGNOSIS — R262 Difficulty in walking, not elsewhere classified: Secondary | ICD-10-CM

## 2023-03-27 DIAGNOSIS — R2681 Unsteadiness on feet: Secondary | ICD-10-CM

## 2023-03-29 ENCOUNTER — Ambulatory Visit: Payer: PPO

## 2023-03-29 DIAGNOSIS — M6281 Muscle weakness (generalized): Secondary | ICD-10-CM | POA: Diagnosis not present

## 2023-03-29 DIAGNOSIS — R262 Difficulty in walking, not elsewhere classified: Secondary | ICD-10-CM

## 2023-03-29 DIAGNOSIS — R2681 Unsteadiness on feet: Secondary | ICD-10-CM

## 2023-03-29 DIAGNOSIS — R269 Unspecified abnormalities of gait and mobility: Secondary | ICD-10-CM

## 2023-03-29 DIAGNOSIS — R278 Other lack of coordination: Secondary | ICD-10-CM

## 2023-03-29 NOTE — Therapy (Signed)
OUTPATIENT PHYSICAL THERAPY NEURO TREATMENT     Patient Name: Lisa Williams MRN: [id number] DOB:05/21/1960, 62 y.o., female Today's Date: 03/29/2023   PCP: Lauro Regulus MD REFERRING PROVIDER: Lauro Regulus MD  END OF SESSION:  PT End of Session - 03/29/23 1319     Visit Number 15    Number of Visits 24    Date for PT Re-Evaluation 05/01/23    PT Start Time 1015    PT Stop Time 1058    PT Time Calculation (min) 43 min    Equipment Utilized During Treatment Gait belt    Activity Tolerance Patient tolerated treatment well    Behavior During Therapy [facility] for tasks assessed/performed                         Past Medical History:  Diagnosis Date   Abdominal pain, right upper quadrant    Back pain    Calculus of kidney 12/09/2013   Chronic back pain    unspecified   Chronic left shoulder pain 07/19/2015   Complication of anesthesia    Functional disorder of bladder    other   Galactorrhea 11/26/2014   Chronic    Hereditary and idiopathic neuropathy 08/19/2013   History of kidney stones    HPV test positive    Hypercholesteremia 08/19/2013   Hypertension    Incomplete bladder emptying    Microscopic hematuria    MS (multiple sclerosis) (HCC)    Muscle spasticity 05/21/2014   Nonspecific findings on examination of urine    other   Osteopenia    PONV (postoperative nausea and vomiting)    Status post laparoscopic supracervical hysterectomy 11/26/2014   Tobacco user 11/26/2014   Wrist fracture    Past Surgical History:  Procedure Laterality Date   bilateral tubal ligation  1996   BREAST CYST EXCISION Left 2002   CYST EXCISION Left 05/10/2022   Procedure: CYST REMOVAL;  Surgeon: Carolan Shiver, MD;  Location: [facility] ORS;  Service: General;  Laterality: Left;   FRACTURE SURGERY     KNEE SURGERY     right   LAPAROSCOPIC SUPRACERVICAL HYSTERECTOMY  08/05/2013   ORIF WRIST FRACTURE Left 01/17/2017   Procedure: OPEN  REDUCTION INTERNAL FIXATION (ORIF) WRIST FRACTURE;  Surgeon: Lyndle Herrlich, MD;  Location: [facility] OR[facility];  Service: Orthopedics;  Laterality: Left;   RADIOLOGY WITH ANESTHESIA N/A 03/18/2020   Procedure: MRI WITH ANESTHESIA CERVICAL SPINE AND BRAIN  WITH AND WITHOUT CONTRAST;  Surgeon: Radiologist, Medication, MD;  Location: [facility] OR;  Service: Radiology;  Laterality: N/A;   TUBAL LIGATION Bilateral    VAGINAL HYSTERECTOMY  03/2006   Patient Active Problem List   Diagnosis Date Noted   Hypokalemia 07/29/2022   Weakness of both lower extremities 07/29/2022   Abscess of left groin 11/15/2021   Abnormal LFTs 11/15/2021   Acute respiratory disease due to COVID-19 virus 11/21/2020   Weakness    Hypoalbuminemia due to protein-calorie malnutrition [facility])    Neurogenic bowel    Neurogenic bladder    Labile blood pressure    Neuropathic pain    Abscess of female pelvis    SVT (supraventricular tachycardia) (HCC)    Radial styloid tenosynovitis 03/12/2018   Wheelchair dependence 02/27/2018   Localized osteoporosis with current pathological fracture with routine healing 01/19/2017   Wrist fracture 01/16/2017   Sprain of ankle 03/23/2016   Closed fracture of lateral malleolus 03/16/2016   Health care maintenance 01/24/2016  Blood pressure elevated without history of HTN 10/25/2015   Essential hypertension 10/25/2015   Multiple sclerosis (HCC) 10/02/2015   Chronic left shoulder pain 07/19/2015   Multiple sclerosis exacerbation (HCC) 07/14/2015   MS (multiple sclerosis) (HCC) 11/26/2014   Increased body mass index 11/26/2014   HPV test positive 11/26/2014   Status post laparoscopic supracervical hysterectomy 11/26/2014   Galactorrhea 11/26/2014   Back ache 05/21/2014   Adiposity 05/21/2014   Disordered sleep 05/21/2014   Muscle spasticity 05/21/2014   Spasticity 05/21/2014   Calculus of kidney 12/09/2013   Renal colic 12/09/2013   Hypercholesteremia 08/19/2013   Hereditary and idiopathic  neuropathy 08/19/2013   Hypercholesterolemia without hypertriglyceridemia 08/19/2013   Bladder infection, chronic 07/25/2012   Disorder of bladder function 07/25/2012   Incomplete bladder emptying 07/25/2012   Microscopic hematuria 07/25/2012   Right upper quadrant pain 07/25/2012    ONSET DATE: 1995  REFERRING DIAG: MS, weakness, other closed fracture of proximal end of R tibia  THERAPY DIAG:  Muscle weakness (generalized)  Difficulty in walking, not elsewhere classified  Unsteadiness on feet  Abnormality of gait and mobility  Other lack of coordination  Rationale for Evaluation and Treatment: Rehabilitation  SUBJECTIVE:                                                                                                                                                                                             SUBJECTIVE STATEMENT: Patient reports there have been no changes since her last visit. She has been having some low level pain in her right shoulder.   Pt accompanied by: self  PERTINENT HISTORY: Patient is returning to PT s/p ORIF of R tibia shaft fracture 09/23/2022. Patient has weakness in BLE with RLE>LLE. She drives with hand controls. Patient has been diagnosed with MS in 1995. PMH includes: back pain, CBP, chronic L shoulder pain, galactorrhea, neuropathy, HPV, hypercholestermeia, HTN, MS, osteopenia, PONV, wrist fracture. Additional order for other closed fracture of proximal end of R tibia with routine healing. Still has to use [employer].   PAIN:  Are you having pain? No  PRECAUTIONS: Fall  RED FLAGS: None   WEIGHT BEARING RESTRICTIONS: No  FALLS: Has patient fallen in last 6 months? Yes. Number of falls 1  LIVING ENVIRONMENT: Lives with: lives with their family Lives in: House/apartment Stairs:  ramp from garage into house Has following equipment at home: Dan Humphreys - 2 wheeled, shower chair, Grab bars, and Ramped entry  PLOF: Independent with household  mobility with device  PATIENT GOALS: get up and pivot and turn, sit to stands. Return to walk, go to bathroom on  her own   OBJECTIVE:  Note: Objective measures were completed at Evaluation unless otherwise noted.  DIAGNOSTIC FINDINGS: MRI of the brain 03/18/2020 showed multiple T2/FLAIR hyperintense foci in the periventricular, juxtacortical and deep white matter.  There were no infratentorial lesions noted.  None of the foci enhanced.   Due to severe claustrophobia, the study was done with conscious sedation in the hospital   COGNITION: Overall cognitive status: Within functional limits for tasks assessed   SENSATION: Lack of sensation in feet, loss in bilateral lateral aspect of knee  COORDINATION: Does not have the strength for functional LE heel slide test   MUSCLE TONE: n/a   POSTURE: rounded shoulders, posterior pelvic tilt, and flexed trunk   LOWER EXTREMITY ROM:     WFL grossly  LOWER EXTREMITY MMT:    MMT Right Eval Left Eval  Hip flexion 2- 2  Hip extension    Hip abduction 3+ 3+  Hip adduction 3 3  Hip internal rotation    Hip external rotation    Knee flexion 2 2+  Knee extension 2- 2  Ankle dorsiflexion 0 0  Ankle plantarflexion 0 0  Ankle inversion    Ankle eversion    (Blank rows = not tested)  BED MOBILITY:  Assess future session  TRANSFERS: Assistive device utilized: Environmental consultant - 2 wheeled  Sit to stand: CGA Stand to sit: CGA  RAMP:  Assess at future date   GAIT: Assess next session  FUNCTIONAL TESTS:  5 times sit to stand: unable to complete 2 consecutive sit to stands  10 meter walk test: perform in future session   PATIENT SURVEYS:  FOTO 34  TODAY'S TREATMENT: DATE: 03/29/23 All activities performed with gait belt and CGA unless otherwise noted.   -Patient performed STS from wheelchair with elevated height (cushion) to two wheeled [place], first attempt patient was able to achieve full upright but unable to decrease UE support to  bring hands from wheelchair to Dayne Dekay, on second attempt pt able to decrease UE support and bring both UE to Tailer Volkert  -Patient ambulated six steps forwards in the open hallway with two wheeled Iverson Sees, cues to weight shift onto RLE before attempting to bring LLE forward, attempted a second repetition but pt with increased fatigue  - Patient attempted two more STS form wheelchair from elevated height, but due to fatigue unable to complete transfer without heavy UE assist   - Seated hamstring curls with assist from slip disc (reduce friction) and SPT 2x10 each LE - Seated cable rows RTB 2x10  - Seated shoulder external rotation RTB 2x10 - Seated forward press + overhead press with orange medicine ball 2x10  - Seated cross body reaching in at various heights with target of tapping hedgehog x5 minutes   Pt requires significant therapeutic rest breaks throughout the session due to fatigue.  PATIENT EDUCATION: Education details: goals, POC Person educated: Patient Education method: Explanation, Demonstration, Tactile cues, and Verbal cues Education comprehension: verbalized understanding, returned demonstration, verbal cues required, and tactile cues required  HOME EXERCISE PROGRAM:  To be given at next visit.   GOALS: Goals reviewed with patient? Yes  SHORT TERM GOALS: Target date: 03/06/2023  Patient will be independent in home exercise program to improve strength/mobility for better functional independence with ADLs.  Baseline: 10/1: give next session 11/5: HEP compliant Goal status: MET   LONG TERM GOALS: Target date: 05/01/2023  Patient will increase FOTO score to equal to or greater than 48 to demonstrate statistically significant  improvement in mobility and quality of life  Baseline: 10/1: 34%  11/5: 39.8%  Goal status: partially met   2.  Patient (> 47 years old) will complete five times sit to stand test in < 15 seconds indicating an increased LE strength and improved  balance.  Baseline: 10/1; unable to tolerate two consecutive sit to stands 11/5: 2.19.26 5 partial sit to stands  Goal status: Partially Met  3.  Patient will increase 10 meter walk test to >0.5 m/s as to improve gait speed for better community ambulation and to reduce fall risk.  Baseline: 10/1: not tested this session 11/5: unable to stand from wheelchair fully to start walking.  Goal status: Ongoing  4.  Patient will transfer independently at home and no longer require use of Sara Plus Lift for increased independence.  Baseline: 10/1: reliant upon lift 11/5: reliant upon lift still  Goal status: partially met    ASSESSMENT:  CLINICAL IMPRESSION: Patient presents to therapy highly motivated and making progress towards her goals. Patient was able to stand from her wheelchair and ambulate six steps in the open hallway for the second time this week. She performs postural exercises with increased scapular elevation which could be due to her frequent use of UE's. With dynamic sitting activities, she is engaging her core with no episodes of LOB. She requires increased time for rest breaks and is fatigued at end of session. Patient reviewed video of sit to stands in order to carryover understanding of need for forward momentum during sit to stand transition.  Pt will continue to benefit from skilled therapy to address remaining deficits in order to improve overall QoL and return to PLOF.     OBJECTIVE IMPAIRMENTS: Abnormal gait, decreased activity tolerance, decreased balance, decreased coordination, decreased endurance, decreased mobility, difficulty walking, decreased strength, impaired perceived functional ability, improper body mechanics, and postural dysfunction.   ACTIVITY LIMITATIONS: carrying, lifting, bending, standing, squatting, sleeping, transfers, bed mobility, bathing, toileting, dressing, reach over head, hygiene/grooming, locomotion level, and caring for others  PARTICIPATION  LIMITATIONS: meal prep, cleaning, laundry, driving, shopping, community activity, yard work, and church  PERSONAL FACTORS: back pain, CBP, chronic L shoulder pain, galactorrhea, neuropathy, HPV, hypercholestermeia, HTN, MS, osteopenia, PONV, wrist fracture are also affecting patient's functional outcome.   REHAB POTENTIAL: Good  CLINICAL DECISION MAKING: Evolving/moderate complexity  EVALUATION COMPLEXITY: Moderate  PLAN:  PT FREQUENCY: 2x/week  PT DURATION: 12 weeks  PLANNED INTERVENTIONS: Therapeutic exercises, Therapeutic activity, Neuromuscular re-education, Balance training, Gait training, Patient/Family education, Self Care, Joint mobilization, Stair training, Vestibular training, Canalith repositioning, Orthotic/Fit training, DME instructions, Dry Needling, Electrical stimulation, Spinal mobilization, Cryotherapy, Moist heat, Compression bandaging, scar mobilization, Taping, Traction, Ultrasound, Manual therapy, and Re-evaluation  PLAN FOR NEXT SESSION:  Gait training, LE strengthening, HEP as appropriate   Randon Goldsmith, Student-PT This entire session was performed under direct supervision and direction of a licensed Estate agent . I have personally read, edited and approve of the note as written.   Precious Bard, PT, DPT Physical Therapist -  [facility] [phone]

## 2023-03-30 DIAGNOSIS — M810 Age-related osteoporosis without current pathological fracture: Secondary | ICD-10-CM | POA: Diagnosis not present

## 2023-03-30 DIAGNOSIS — R262 Difficulty in walking, not elsewhere classified: Secondary | ICD-10-CM | POA: Diagnosis not present

## 2023-03-30 DIAGNOSIS — G35 Multiple sclerosis: Secondary | ICD-10-CM | POA: Diagnosis not present

## 2023-03-30 DIAGNOSIS — M6281 Muscle weakness (generalized): Secondary | ICD-10-CM | POA: Diagnosis not present

## 2023-04-02 ENCOUNTER — Ambulatory Visit: Payer: PPO

## 2023-04-02 DIAGNOSIS — M6281 Muscle weakness (generalized): Secondary | ICD-10-CM | POA: Diagnosis not present

## 2023-04-02 DIAGNOSIS — R2681 Unsteadiness on feet: Secondary | ICD-10-CM

## 2023-04-02 DIAGNOSIS — R278 Other lack of coordination: Secondary | ICD-10-CM

## 2023-04-02 DIAGNOSIS — R269 Unspecified abnormalities of gait and mobility: Secondary | ICD-10-CM

## 2023-04-02 DIAGNOSIS — R262 Difficulty in walking, not elsewhere classified: Secondary | ICD-10-CM

## 2023-04-02 NOTE — Therapy (Signed)
OUTPATIENT PHYSICAL THERAPY NEURO TREATMENT     Patient Name: Lisa Williams MRN: [id number] DOB:06/02/1960, 62 y.o., female Today's Date: 04/02/2023   PCP: Lauro Regulus MD REFERRING PROVIDER: Lauro Regulus MD  END OF SESSION:  PT End of Session - 04/02/23 1446     Visit Number 16    Number of Visits 24    Date for PT Re-Evaluation 05/01/23    PT Start Time 1145    PT Stop Time 1228    PT Time Calculation (min) 43 min    Equipment Utilized During Treatment Gait belt    Activity Tolerance Patient tolerated treatment well    Behavior During Therapy [facility] for tasks assessed/performed                         Past Medical History:  Diagnosis Date   Abdominal pain, right upper quadrant    Back pain    Calculus of kidney 12/09/2013   Chronic back pain    unspecified   Chronic left shoulder pain 07/19/2015   Complication of anesthesia    Functional disorder of bladder    other   Galactorrhea 11/26/2014   Chronic    Hereditary and idiopathic neuropathy 08/19/2013   History of kidney stones    HPV test positive    Hypercholesteremia 08/19/2013   Hypertension    Incomplete bladder emptying    Microscopic hematuria    MS (multiple sclerosis) (HCC)    Muscle spasticity 05/21/2014   Nonspecific findings on examination of urine    other   Osteopenia    PONV (postoperative nausea and vomiting)    Status post laparoscopic supracervical hysterectomy 11/26/2014   Tobacco user 11/26/2014   Wrist fracture    Past Surgical History:  Procedure Laterality Date   bilateral tubal ligation  1996   BREAST CYST EXCISION Left 2002   CYST EXCISION Left 05/10/2022   Procedure: CYST REMOVAL;  Surgeon: Carolan Shiver, MD;  Location: [facility] ORS;  Service: General;  Laterality: Left;   FRACTURE SURGERY     KNEE SURGERY     right   LAPAROSCOPIC SUPRACERVICAL HYSTERECTOMY  08/05/2013   ORIF WRIST FRACTURE Left 01/17/2017   Procedure: OPEN  REDUCTION INTERNAL FIXATION (ORIF) WRIST FRACTURE;  Surgeon: Lyndle Herrlich, MD;  Location: [facility] OR[facility];  Service: Orthopedics;  Laterality: Left;   RADIOLOGY WITH ANESTHESIA N/A 03/18/2020   Procedure: MRI WITH ANESTHESIA CERVICAL SPINE AND BRAIN  WITH AND WITHOUT CONTRAST;  Surgeon: Radiologist, Medication, MD;  Location: [facility] OR;  Service: Radiology;  Laterality: N/A;   TUBAL LIGATION Bilateral    VAGINAL HYSTERECTOMY  03/2006   Patient Active Problem List   Diagnosis Date Noted   Hypokalemia 07/29/2022   Weakness of both lower extremities 07/29/2022   Abscess of left groin 11/15/2021   Abnormal LFTs 11/15/2021   Acute respiratory disease due to COVID-19 virus 11/21/2020   Weakness    Hypoalbuminemia due to protein-calorie malnutrition [facility])    Neurogenic bowel    Neurogenic bladder    Labile blood pressure    Neuropathic pain    Abscess of female pelvis    SVT (supraventricular tachycardia) (HCC)    Radial styloid tenosynovitis 03/12/2018   Wheelchair dependence 02/27/2018   Localized osteoporosis with current pathological fracture with routine healing 01/19/2017   Wrist fracture 01/16/2017   Sprain of ankle 03/23/2016   Closed fracture of lateral malleolus 03/16/2016   Health care maintenance 01/24/2016  Blood pressure elevated without history of HTN 10/25/2015   Essential hypertension 10/25/2015   Multiple sclerosis (HCC) 10/02/2015   Chronic left shoulder pain 07/19/2015   Multiple sclerosis exacerbation (HCC) 07/14/2015   MS (multiple sclerosis) (HCC) 11/26/2014   Increased body mass index 11/26/2014   HPV test positive 11/26/2014   Status post laparoscopic supracervical hysterectomy 11/26/2014   Galactorrhea 11/26/2014   Back ache 05/21/2014   Adiposity 05/21/2014   Disordered sleep 05/21/2014   Muscle spasticity 05/21/2014   Spasticity 05/21/2014   Calculus of kidney 12/09/2013   Renal colic 12/09/2013   Hypercholesteremia 08/19/2013   Hereditary and idiopathic  neuropathy 08/19/2013   Hypercholesterolemia without hypertriglyceridemia 08/19/2013   Bladder infection, chronic 07/25/2012   Disorder of bladder function 07/25/2012   Incomplete bladder emptying 07/25/2012   Microscopic hematuria 07/25/2012   Right upper quadrant pain 07/25/2012    ONSET DATE: 1995  REFERRING DIAG: MS, weakness, other closed fracture of proximal end of R tibia  THERAPY DIAG:  Muscle weakness (generalized)  Difficulty in walking, not elsewhere classified  Unsteadiness on feet  Abnormality of gait and mobility  Other lack of coordination  Rationale for Evaluation and Treatment: Rehabilitation  SUBJECTIVE:                                                                                                                                                                                             SUBJECTIVE STATEMENT: Patient reports there have been no changes since her last visit. She has noticed a tight sensation in her left hip.   Pt accompanied by: self  PERTINENT HISTORY: Patient is returning to PT s/p ORIF of R tibia shaft fracture 09/23/2022. Patient has weakness in BLE with RLE>LLE. She drives with hand controls. Patient has been diagnosed with MS in 1995. PMH includes: back pain, CBP, chronic L shoulder pain, galactorrhea, neuropathy, HPV, hypercholestermeia, HTN, MS, osteopenia, PONV, wrist fracture. Additional order for other closed fracture of proximal end of R tibia with routine healing. Still has to use [employer].   PAIN:  Are you having pain? No  PRECAUTIONS: Fall  RED FLAGS: None   WEIGHT BEARING RESTRICTIONS: No  FALLS: Has patient fallen in last 6 months? Yes. Number of falls 1  LIVING ENVIRONMENT: Lives with: lives with their family Lives in: House/apartment Stairs:  ramp from garage into house Has following equipment at home: Dan Humphreys - 2 wheeled, shower chair, Grab bars, and Ramped entry  PLOF: Independent with household mobility with  device  PATIENT GOALS: get up and pivot and turn, sit to stands. Return to walk, go to bathroom on her own  OBJECTIVE:  Note: Objective measures were completed at Evaluation unless otherwise noted.  DIAGNOSTIC FINDINGS: MRI of the brain 03/18/2020 showed multiple T2/FLAIR hyperintense foci in the periventricular, juxtacortical and deep white matter.  There were no infratentorial lesions noted.  None of the foci enhanced.   Due to severe claustrophobia, the study was done with conscious sedation in the hospital   COGNITION: Overall cognitive status: Within functional limits for tasks assessed   SENSATION: Lack of sensation in feet, loss in bilateral lateral aspect of knee  COORDINATION: Does not have the strength for functional LE heel slide test   MUSCLE TONE: n/a   POSTURE: rounded shoulders, posterior pelvic tilt, and flexed trunk   LOWER EXTREMITY ROM:     WFL grossly  LOWER EXTREMITY MMT:    MMT Right Eval Left Eval  Hip flexion 2- 2  Hip extension    Hip abduction 3+ 3+  Hip adduction 3 3  Hip internal rotation    Hip external rotation    Knee flexion 2 2+  Knee extension 2- 2  Ankle dorsiflexion 0 0  Ankle plantarflexion 0 0  Ankle inversion    Ankle eversion    (Blank rows = not tested)  BED MOBILITY:  Assess future session  TRANSFERS: Assistive device utilized: Environmental consultant - 2 wheeled  Sit to stand: CGA Stand to sit: CGA  RAMP:  Assess at future date   GAIT: Assess next session  FUNCTIONAL TESTS:  5 times sit to stand: unable to complete 2 consecutive sit to stands  10 meter walk test: perform in future session   PATIENT SURVEYS:  FOTO 34  TODAY'S TREATMENT: DATE: 04/02/23 All activities performed with gait belt and CGA unless otherwise noted.   -Patient performed STS from wheelchair with elevated height (cushion) to two wheeled Channing Yeager, first attempt patient was able to achieve full upright but unable to bring UE forward to Bentley Fissel due to  backwards lean at trunk, on second attempt pt able to decrease UE support and bring both UE to [employer]  -Patient ambulated four steps forwards with two wheeled Linday Rhodes, cues to weight shift onto RLE before attempting to bring LLE forward,   -Patient ambulated three steps backwards with two wheeled Shawntavia Saunders before needing a seated rest break   - Patient attempted two more STS from wheelchair from elevated height, but due to fatigue unable to complete transfer without heavy UE assist   -Patient attempted one more STS from wheelchair without cushion underneath, pt almost fully upright    -Cross body punches 2x25  -Cross body punches + ducking 2x20  Manual:  Myofascial release of left lateral thigh x5 minutes   Pt requires significant therapeutic rest breaks throughout the session due to fatigue.  PATIENT EDUCATION: Education details: goals, POC Person educated: Patient Education method: Explanation, Demonstration, Tactile cues, and Verbal cues Education comprehension: verbalized understanding, returned demonstration, verbal cues required, and tactile cues required  HOME EXERCISE PROGRAM:  To be given at next visit.   GOALS: Goals reviewed with patient? Yes  SHORT TERM GOALS: Target date: 03/06/2023  Patient will be independent in home exercise program to improve strength/mobility for better functional independence with ADLs.  Baseline: 10/1: give next session 11/5: HEP compliant Goal status: MET   LONG TERM GOALS: Target date: 05/01/2023  Patient will increase FOTO score to equal to or greater than 48 to demonstrate statistically significant improvement in mobility and quality of life  Baseline: 10/1: 34%  11/5: 39.8%  Goal status: partially  met   2.  Patient (> 75 years old) will complete five times sit to stand test in < 15 seconds indicating an increased LE strength and improved balance.  Baseline: 10/1; unable to tolerate two consecutive sit to stands 11/5: 2.19.26 5  partial sit to stands  Goal status: Partially Met  3.  Patient will increase 10 meter walk test to >0.5 m/s as to improve gait speed for better community ambulation and to reduce fall risk.  Baseline: 10/1: not tested this session 11/5: unable to stand from wheelchair fully to start walking.  Goal status: Ongoing  4.  Patient will transfer independently at home and no longer require use of Sara Plus Lift for increased independence.  Baseline: 10/1: reliant upon lift 11/5: reliant upon lift still  Goal status: partially met    ASSESSMENT:  CLINICAL IMPRESSION: Patient presents to therapy highly motivated and making progress towards her goals. She is demonstrating consistency with sit to stands, evidenced by ability to perform at least one repetition to front wheeled Dunia Pringle over the last three sessions. She was almost able to complete a full sit to stand without assist from standard wheelchair height demonstrating an improvement in transfer ability. There was tightness in her left hip which she reported less discomfort after release of the muscle. She requires increased time for rest breaks and is fatigued at end of session. Pt will continue to benefit from skilled therapy to address remaining deficits in order to improve overall QoL and return to PLOF.     OBJECTIVE IMPAIRMENTS: Abnormal gait, decreased activity tolerance, decreased balance, decreased coordination, decreased endurance, decreased mobility, difficulty walking, decreased strength, impaired perceived functional ability, improper body mechanics, and postural dysfunction.   ACTIVITY LIMITATIONS: carrying, lifting, bending, standing, squatting, sleeping, transfers, bed mobility, bathing, toileting, dressing, reach over head, hygiene/grooming, locomotion level, and caring for others  PARTICIPATION LIMITATIONS: meal prep, cleaning, laundry, driving, shopping, community activity, yard work, and church  PERSONAL FACTORS: back pain, CBP,  chronic L shoulder pain, galactorrhea, neuropathy, HPV, hypercholestermeia, HTN, MS, osteopenia, PONV, wrist fracture are also affecting patient's functional outcome.   REHAB POTENTIAL: Good  CLINICAL DECISION MAKING: Evolving/moderate complexity  EVALUATION COMPLEXITY: Moderate  PLAN:  PT FREQUENCY: 2x/week  PT DURATION: 12 weeks  PLANNED INTERVENTIONS: Therapeutic exercises, Therapeutic activity, Neuromuscular re-education, Balance training, Gait training, Patient/Family education, Self Care, Joint mobilization, Stair training, Vestibular training, Canalith repositioning, Orthotic/Fit training, DME instructions, Dry Needling, Electrical stimulation, Spinal mobilization, Cryotherapy, Moist heat, Compression bandaging, scar mobilization, Taping, Traction, Ultrasound, Manual therapy, and Re-evaluation  PLAN FOR NEXT SESSION:  Gait training, LE strengthening, HEP as appropriate   Randon Goldsmith, Student-PT This entire session was performed under direct supervision and direction of a licensed Estate agent . I have personally read, edited and approve of the note as written.   Precious Bard, PT, DPT Physical Therapist - [place] [facility]  Outpatient Physical Therapy- [facility] [phone]

## 2023-04-03 ENCOUNTER — Ambulatory Visit: Payer: PPO

## 2023-04-09 NOTE — Therapy (Incomplete)
OUTPATIENT PHYSICAL THERAPY NEURO TREATMENT     Patient Name: Lisa Williams MRN: [id number] DOB:[birth year], 62 y.o., female Today's Date: 04/10/2023   PCP: Lauro Regulus MD REFERRING PROVIDER: Lauro Regulus MD  END OF SESSION:  PT End of Session - 04/10/23 1117     Visit Number 17    Number of Visits 24    Date for PT Re-Evaluation 05/01/23    PT Start Time 1016    PT Stop Time 1056    PT Time Calculation (min) 40 min    Equipment Utilized During Treatment Gait belt    Activity Tolerance Patient tolerated treatment well;Patient limited by fatigue    Behavior During Therapy [facility] for tasks assessed/performed                          Past Medical History:  Diagnosis Date   Abdominal pain, right upper quadrant    Back pain    Calculus of kidney 12/09/2013   Chronic back pain    unspecified   Chronic left shoulder pain 07/19/2015   Complication of anesthesia    Functional disorder of bladder    other   Galactorrhea 11/26/2014   Chronic    Hereditary and idiopathic neuropathy 08/19/2013   History of kidney stones    HPV test positive    Hypercholesteremia 08/19/2013   Hypertension    Incomplete bladder emptying    Microscopic hematuria    MS (multiple sclerosis) (HCC)    Muscle spasticity 05/21/2014   Nonspecific findings on examination of urine    other   Osteopenia    PONV (postoperative nausea and vomiting)    Status post laparoscopic supracervical hysterectomy 11/26/2014   Tobacco user 11/26/2014   Wrist fracture    Past Surgical History:  Procedure Laterality Date   bilateral tubal ligation  1996   BREAST CYST EXCISION Left 2002   CYST EXCISION Left 05/10/2022   Procedure: CYST REMOVAL;  Surgeon: Carolan Shiver, MD;  Location: [facility] ORS;  Service: General;  Laterality: Left;   FRACTURE SURGERY     KNEE SURGERY     right   LAPAROSCOPIC SUPRACERVICAL HYSTERECTOMY  08/05/2013   ORIF WRIST FRACTURE Left  01/17/2017   Procedure: OPEN REDUCTION INTERNAL FIXATION (ORIF) WRIST FRACTURE;  Surgeon: Lyndle Herrlich, MD;  Location: [facility] OR[facility];  Service: Orthopedics;  Laterality: Left;   RADIOLOGY WITH ANESTHESIA N/A 03/18/2020   Procedure: MRI WITH ANESTHESIA CERVICAL SPINE AND BRAIN  WITH AND WITHOUT CONTRAST;  Surgeon: Radiologist, Medication, MD;  Location: [facility] OR;  Service: Radiology;  Laterality: N/A;   TUBAL LIGATION Bilateral    VAGINAL HYSTERECTOMY  03/2006   Patient Active Problem List   Diagnosis Date Noted   Hypokalemia 07/29/2022   Weakness of both lower extremities 07/29/2022   Abscess of left groin 11/15/2021   Abnormal LFTs 11/15/2021   Acute respiratory disease due to COVID-19 virus 11/21/2020   Weakness    Hypoalbuminemia due to protein-calorie malnutrition [facility])    Neurogenic bowel    Neurogenic bladder    Labile blood pressure    Neuropathic pain    Abscess of female pelvis    SVT (supraventricular tachycardia) (HCC)    Radial styloid tenosynovitis 03/12/2018   Wheelchair dependence 02/27/2018   Localized osteoporosis with current pathological fracture with routine healing 01/19/2017   Wrist fracture 01/16/2017   Sprain of ankle 03/23/2016   Closed fracture of lateral malleolus 03/16/2016   Health care  maintenance 01/24/2016   Blood pressure elevated without history of HTN 10/25/2015   Essential hypertension 10/25/2015   Multiple sclerosis (HCC) 10/02/2015   Chronic left shoulder pain 07/19/2015   Multiple sclerosis exacerbation (HCC) 07/14/2015   MS (multiple sclerosis) (HCC) 11/26/2014   Increased body mass index 11/26/2014   HPV test positive 11/26/2014   Status post laparoscopic supracervical hysterectomy 11/26/2014   Galactorrhea 11/26/2014   Back ache 05/21/2014   Adiposity 05/21/2014   Disordered sleep 05/21/2014   Muscle spasticity 05/21/2014   Spasticity 05/21/2014   Calculus of kidney 12/09/2013   Renal colic 12/09/2013   Hypercholesteremia 08/19/2013    Hereditary and idiopathic neuropathy 08/19/2013   Hypercholesterolemia without hypertriglyceridemia 08/19/2013   Bladder infection, chronic 07/25/2012   Disorder of bladder function 07/25/2012   Incomplete bladder emptying 07/25/2012   Microscopic hematuria 07/25/2012   Right upper quadrant pain 07/25/2012    ONSET DATE: 1995  REFERRING DIAG: MS, weakness, other closed fracture of proximal end of R tibia  THERAPY DIAG:  Muscle weakness (generalized)  Difficulty in walking, not elsewhere classified  Unsteadiness on feet  Abnormality of gait and mobility  Other lack of coordination  Other abnormalities of gait and mobility  Rationale for Evaluation and Treatment: Rehabilitation  SUBJECTIVE:                                                                                                                                                                                             SUBJECTIVE STATEMENT: Patient reports there have been no changes since her last visit. She has been feeling under the weather since Thanksgiving and plans to stop by [place] [facility] later to get checked out.   Pt accompanied by: self  PERTINENT HISTORY: Patient is returning to PT s/p ORIF of R tibia shaft fracture 09/23/2022. Patient has weakness in BLE with RLE>LLE. She drives with hand controls. Patient has been diagnosed with MS in 1995. PMH includes: back pain, CBP, chronic L shoulder pain, galactorrhea, neuropathy, HPV, hypercholestermeia, HTN, MS, osteopenia, PONV, wrist fracture. Additional order for other closed fracture of proximal end of R tibia with routine healing. Still has to use [employer].   PAIN:  Are you having pain? No  PRECAUTIONS: Fall  RED FLAGS: None   WEIGHT BEARING RESTRICTIONS: No  FALLS: Has patient fallen in last 6 months? Yes. Number of falls 1  LIVING ENVIRONMENT: Lives with: lives with their family Lives in: House/apartment Stairs:  ramp from garage into house Has  following equipment at home: Dan Humphreys - 2 wheeled, shower chair, Grab bars, and Ramped entry  PLOF: Independent with household mobility with  device  PATIENT GOALS: get up and pivot and turn, sit to stands. Return to walk, go to bathroom on her own   OBJECTIVE:  Note: Objective measures were completed at Evaluation unless otherwise noted.  DIAGNOSTIC FINDINGS: MRI of the brain 03/18/2020 showed multiple T2/FLAIR hyperintense foci in the periventricular, juxtacortical and deep white matter.  There were no infratentorial lesions noted.  None of the foci enhanced.   Due to severe claustrophobia, the study was done with conscious sedation in the hospital   COGNITION: Overall cognitive status: Within functional limits for tasks assessed   SENSATION: Lack of sensation in feet, loss in bilateral lateral aspect of knee  COORDINATION: Does not have the strength for functional LE heel slide test   MUSCLE TONE: n/a   POSTURE: rounded shoulders, posterior pelvic tilt, and flexed trunk   LOWER EXTREMITY ROM:     WFL grossly  LOWER EXTREMITY MMT:    MMT Right Eval Left Eval  Hip flexion 2- 2  Hip extension    Hip abduction 3+ 3+  Hip adduction 3 3  Hip internal rotation    Hip external rotation    Knee flexion 2 2+  Knee extension 2- 2  Ankle dorsiflexion 0 0  Ankle plantarflexion 0 0  Ankle inversion    Ankle eversion    (Blank rows = not tested)  BED MOBILITY:  Assess future session  TRANSFERS: Assistive device utilized: Environmental consultant - 2 wheeled  Sit to stand: CGA Stand to sit: CGA  RAMP:  Assess at future date   GAIT: Assess next session  FUNCTIONAL TESTS:  5 times sit to stand: unable to complete 2 consecutive sit to stands  10 meter walk test: perform in future session   PATIENT SURVEYS:  FOTO 34  TODAY'S TREATMENT: DATE: 04/10/23 All activities performed with gait belt and CGA unless otherwise noted.   -Patient performed STS from wheelchair with elevated  height (cushion) to two wheeled Lisa Williams, first two attempts patient was able to achieve full upright but unable to bring UE forward to Lisa Williams due to backwards lean at trunk, on third attempt pt able to decrease UE support and bring both UE to Lisa Williams  -Patient ambulated two steps forward with verbal cues to weight shift onto the stance limb before taking a step with the swing limb, required a seated break due to pt reports of pain in R knee and feeling of unsteadiness  - Patient attempted four more STS from wheelchair from elevated height, but due to fatigue unable to complete transfer without heavy UE assist and unable to achieve full upright, no other reports of R knee pain with attempts  - Seated hamstring curls with assist from SPT and slip disc 2x10 each LE -Seated cross body punches 1x25, 1x30  -Seated cross body punches + ducking 1x30  Pt requires significant therapeutic rest breaks throughout the session due to fatigue.  PATIENT EDUCATION: Education details: Pt educated throughout session about proper posture and technique with exercises. Improved exercise technique, movement at target joints, use of target muscles after min to mod verbal, visual, tactile cues.  Person educated: Patient Education method: Explanation, Demonstration, Tactile cues, and Verbal cues Education comprehension: verbalized understanding, returned demonstration, verbal cues required, and tactile cues required  HOME EXERCISE PROGRAM:  To be given at next visit.   GOALS: Goals reviewed with patient? Yes  SHORT TERM GOALS: Target date: 03/06/2023  Patient will be independent in home exercise program to improve strength/mobility for better functional independence  with ADLs.  Baseline: 10/1: give next session 11/5: HEP compliant Goal status: MET   LONG TERM GOALS: Target date: 05/01/2023  Patient will increase FOTO score to equal to or greater than 48 to demonstrate statistically significant improvement in  mobility and quality of life  Baseline: 10/1: 34%  11/5: 39.8%  Goal status: partially met   2.  Patient (> 67 years old) will complete five times sit to stand test in < 15 seconds indicating an increased LE strength and improved balance.  Baseline: 10/1; unable to tolerate two consecutive sit to stands 11/5: 2.19.26 5 partial sit to stands  Goal status: Partially Met  3.  Patient will increase 10 meter walk test to >0.5 m/s as to improve gait speed for better community ambulation and to reduce fall risk.  Baseline: 10/1: not tested this session 11/5: unable to stand from wheelchair fully to start walking.  Goal status: Ongoing  4.  Patient will transfer independently at home and no longer require use of [employer] Lift for increased independence.  Baseline: 10/1: reliant upon lift 11/5: reliant upon lift still  Goal status: partially met    ASSESSMENT:  CLINICAL IMPRESSION: Patient presents to therapy slightly under the weather, but still eager to participate. She was unable to take more than two consecutive steps this session due to feeling of "knee giving out." She was limited in today's session due to not feeling her best and personal circumstances. She requires increased time for rest breaks and is fatigued at end of session. Session ending a couple of minutes early to allow pt time to travel to clinic. Pt will continue to benefit from skilled therapy to address remaining deficits in order to improve overall QoL and return to PLOF.     OBJECTIVE IMPAIRMENTS: Abnormal gait, decreased activity tolerance, decreased balance, decreased coordination, decreased endurance, decreased mobility, difficulty walking, decreased strength, impaired perceived functional ability, improper body mechanics, and postural dysfunction.   ACTIVITY LIMITATIONS: carrying, lifting, bending, standing, squatting, sleeping, transfers, bed mobility, bathing, toileting, dressing, reach over head, hygiene/grooming,  locomotion level, and caring for others  PARTICIPATION LIMITATIONS: meal prep, cleaning, laundry, driving, shopping, community activity, yard work, and church  PERSONAL FACTORS: back pain, CBP, chronic L shoulder pain, galactorrhea, neuropathy, HPV, hypercholestermeia, HTN, MS, osteopenia, PONV, wrist fracture are also affecting patient's functional outcome.   REHAB POTENTIAL: Good  CLINICAL DECISION MAKING: Evolving/moderate complexity  EVALUATION COMPLEXITY: Moderate  PLAN:  PT FREQUENCY: 2x/week  PT DURATION: 12 weeks  PLANNED INTERVENTIONS: Therapeutic exercises, Therapeutic activity, Neuromuscular re-education, Balance training, Gait training, Patient/Family education, Self Care, Joint mobilization, Stair training, Vestibular training, Canalith repositioning, Orthotic/Fit training, DME instructions, Dry Needling, Electrical stimulation, Spinal mobilization, Cryotherapy, Moist heat, Compression bandaging, scar mobilization, Taping, Traction, Ultrasound, Manual therapy, and Re-evaluation  PLAN FOR NEXT SESSION:  Gait training, LE strengthening, HEP as appropriate   Randon Goldsmith, Student-PT  This entire session was performed under direct supervision and direction of a licensed Estate agent . I have personally read, edited and approve of the note as written.   Temple Pacini PT, DPT  Physical Therapist - [facility]  Outpatient Physical Therapy- Main Campus [phone]

## 2023-04-10 ENCOUNTER — Ambulatory Visit: Payer: PPO | Attending: Neurology

## 2023-04-10 DIAGNOSIS — R2681 Unsteadiness on feet: Secondary | ICD-10-CM

## 2023-04-10 DIAGNOSIS — R2689 Other abnormalities of gait and mobility: Secondary | ICD-10-CM

## 2023-04-10 DIAGNOSIS — R269 Unspecified abnormalities of gait and mobility: Secondary | ICD-10-CM

## 2023-04-10 DIAGNOSIS — R262 Difficulty in walking, not elsewhere classified: Secondary | ICD-10-CM

## 2023-04-10 DIAGNOSIS — R0989 Other specified symptoms and signs involving the circulatory and respiratory systems: Secondary | ICD-10-CM | POA: Diagnosis not present

## 2023-04-10 DIAGNOSIS — R278 Other lack of coordination: Secondary | ICD-10-CM

## 2023-04-10 DIAGNOSIS — M6281 Muscle weakness (generalized): Secondary | ICD-10-CM | POA: Diagnosis not present

## 2023-04-12 ENCOUNTER — Ambulatory Visit: Payer: PPO

## 2023-04-12 DIAGNOSIS — R278 Other lack of coordination: Secondary | ICD-10-CM

## 2023-04-12 DIAGNOSIS — M6281 Muscle weakness (generalized): Secondary | ICD-10-CM

## 2023-04-12 DIAGNOSIS — R262 Difficulty in walking, not elsewhere classified: Secondary | ICD-10-CM

## 2023-04-12 DIAGNOSIS — R2681 Unsteadiness on feet: Secondary | ICD-10-CM

## 2023-04-12 DIAGNOSIS — R2689 Other abnormalities of gait and mobility: Secondary | ICD-10-CM

## 2023-04-12 DIAGNOSIS — R269 Unspecified abnormalities of gait and mobility: Secondary | ICD-10-CM

## 2023-04-12 NOTE — Therapy (Signed)
OUTPATIENT PHYSICAL THERAPY NEURO TREATMENT     Patient Name: Lisa Williams MRN: [id number] DOB:[birth year], 62 y.o., female Today's Date: 04/12/2023   PCP: Lauro Regulus MD REFERRING PROVIDER: Lauro Regulus MD  END OF SESSION:  PT End of Session - 04/12/23 1010     Visit Number 18    Number of Visits 24    Date for PT Re-Evaluation 05/01/23    PT Start Time 1015    PT Stop Time 1059    PT Time Calculation (min) 44 min    Equipment Utilized During Treatment Gait belt    Activity Tolerance Patient tolerated treatment well;Patient limited by fatigue    Behavior During Therapy [facility] for tasks assessed/performed               Past Medical History:  Diagnosis Date   Abdominal pain, right upper quadrant    Back pain    Calculus of kidney 12/09/2013   Chronic back pain    unspecified   Chronic left shoulder pain 07/19/2015   Complication of anesthesia    Functional disorder of bladder    other   Galactorrhea 11/26/2014   Chronic    Hereditary and idiopathic neuropathy 08/19/2013   History of kidney stones    HPV test positive    Hypercholesteremia 08/19/2013   Hypertension    Incomplete bladder emptying    Microscopic hematuria    MS (multiple sclerosis) (HCC)    Muscle spasticity 05/21/2014   Nonspecific findings on examination of urine    other   Osteopenia    PONV (postoperative nausea and vomiting)    Status post laparoscopic supracervical hysterectomy 11/26/2014   Tobacco user 11/26/2014   Wrist fracture    Past Surgical History:  Procedure Laterality Date   bilateral tubal ligation  1996   BREAST CYST EXCISION Left 2002   CYST EXCISION Left 05/10/2022   Procedure: CYST REMOVAL;  Surgeon: Carolan Shiver, MD;  Location: [facility] ORS;  Service: General;  Laterality: Left;   FRACTURE SURGERY     KNEE SURGERY     right   LAPAROSCOPIC SUPRACERVICAL HYSTERECTOMY  08/05/2013   ORIF WRIST FRACTURE Left 01/17/2017   Procedure: OPEN  REDUCTION INTERNAL FIXATION (ORIF) WRIST FRACTURE;  Surgeon: Lyndle Herrlich, MD;  Location: [facility] ORS;  Service: Orthopedics;  Laterality: Left;   RADIOLOGY WITH ANESTHESIA N/A 03/18/2020   Procedure: MRI WITH ANESTHESIA CERVICAL SPINE AND BRAIN  WITH AND WITHOUT CONTRAST;  Surgeon: Radiologist, Medication, MD;  Location: [facility] OR;  Service: Radiology;  Laterality: N/A;   TUBAL LIGATION Bilateral    VAGINAL HYSTERECTOMY  03/2006   Patient Active Problem List   Diagnosis Date Noted   Hypokalemia 07/29/2022   Weakness of both lower extremities 07/29/2022   Abscess of left groin 11/15/2021   Abnormal LFTs 11/15/2021   Acute respiratory disease due to COVID-19 virus 11/21/2020   Weakness    Hypoalbuminemia due to protein-calorie malnutrition [facility])    Neurogenic bowel    Neurogenic bladder    Labile blood pressure    Neuropathic pain    Abscess of female pelvis    SVT (supraventricular tachycardia) (HCC)    Radial styloid tenosynovitis 03/12/2018   Wheelchair dependence 02/27/2018   Localized osteoporosis with current pathological fracture with routine healing 01/19/2017   Wrist fracture 01/16/2017   Sprain of ankle 03/23/2016   Closed fracture of lateral malleolus 03/16/2016   Health care maintenance 01/24/2016   Blood pressure elevated without history of HTN  10/25/2015   Essential hypertension 10/25/2015   Multiple sclerosis (HCC) 10/02/2015   Chronic left shoulder pain 07/19/2015   Multiple sclerosis exacerbation (HCC) 07/14/2015   MS (multiple sclerosis) (HCC) 11/26/2014   Increased body mass index 11/26/2014   HPV test positive 11/26/2014   Status post laparoscopic supracervical hysterectomy 11/26/2014   Galactorrhea 11/26/2014   Back ache 05/21/2014   Adiposity 05/21/2014   Disordered sleep 05/21/2014   Muscle spasticity 05/21/2014   Spasticity 05/21/2014   Calculus of kidney 12/09/2013   Renal colic 12/09/2013   Hypercholesteremia 08/19/2013   Hereditary and idiopathic  neuropathy 08/19/2013   Hypercholesterolemia without hypertriglyceridemia 08/19/2013   Bladder infection, chronic 07/25/2012   Disorder of bladder function 07/25/2012   Incomplete bladder emptying 07/25/2012   Microscopic hematuria 07/25/2012   Right upper quadrant pain 07/25/2012    ONSET DATE: 1995  REFERRING DIAG: MS, weakness, other closed fracture of proximal end of R tibia  THERAPY DIAG:  Muscle weakness (generalized)  Difficulty in walking, not elsewhere classified  Unsteadiness on feet  Abnormality of gait and mobility  Other lack of coordination  Other abnormalities of gait and mobility  Rationale for Evaluation and Treatment: Rehabilitation  SUBJECTIVE:                                                                                                                                                                                             SUBJECTIVE STATEMENT: Patient reports there have been no changes since her last visit. She is feeling better compared to Tuesday and reports use of her stationary bike in between sessions. Pt reports she was seen in clinic since prior visit regarding her acute illness, pt indicates provider not concerned about any infectious process at this time.   Pt accompanied by: self  PERTINENT HISTORY: Patient is returning to PT s/p ORIF of R tibia shaft fracture 09/23/2022. Patient has weakness in BLE with RLE>LLE. She drives with hand controls. Patient has been diagnosed with MS in 1995. PMH includes: back pain, CBP, chronic L shoulder pain, galactorrhea, neuropathy, HPV, hypercholestermeia, HTN, MS, osteopenia, PONV, wrist fracture. Additional order for other closed fracture of proximal end of R tibia with routine healing. Still has to use [employer].   PAIN:  Are you having pain? No  PRECAUTIONS: Fall  RED FLAGS: None   WEIGHT BEARING RESTRICTIONS: No  FALLS: Has patient fallen in last 6 months? Yes. Number of falls 1  LIVING  ENVIRONMENT: Lives with: lives with their family Lives in: House/apartment Stairs:  ramp from garage into house Has following equipment at home: Dan Humphreys - 2 wheeled, shower chair, [employer]  bars, and Ramped entry  PLOF: Independent with household mobility with device  PATIENT GOALS: get up and pivot and turn, sit to stands. Return to walk, go to bathroom on her own   OBJECTIVE:  Note: Objective measures were completed at Evaluation unless otherwise noted.  DIAGNOSTIC FINDINGS: MRI of the brain 03/18/2020 showed multiple T2/FLAIR hyperintense foci in the periventricular, juxtacortical and deep white matter.  There were no infratentorial lesions noted.  None of the foci enhanced.   Due to severe claustrophobia, the study was done with conscious sedation in the hospital   SENSATION: Lack of sensation in feet, loss in bilateral lateral aspect of knee  LOWER EXTREMITY MMT:    MMT Right Eval Left Eval  Hip flexion 2- 2  Hip extension    Hip abduction 3+ 3+  Hip adduction 3 3  Hip internal rotation    Hip external rotation    Knee flexion 2 2+  Knee extension 2- 2  Ankle dorsiflexion 0 0  Ankle plantarflexion 0 0  Ankle inversion    Ankle eversion    (Blank rows = not tested)  TRANSFERS: Assistive device utilized: Environmental consultant - 2 wheeled  Sit to stand: CGA Stand to sit: CGA  FUNCTIONAL TESTS:  5 times sit to stand: unable to complete 2 consecutive sit to stands  10 meter walk test: perform in future session   PATIENT SURVEYS:  FOTO 34  TODAY'S TREATMENT: DATE: 04/12/23 All activities performed with gait belt and CGA unless otherwise noted.   In // bars   -Patient attempted STS from wheelchair with elevated height (cushion) with one hand on parallel bars and one hand on wheelchair x3, pt unable to achieve full upright and with heavy UE assist   -Patient performed one STS from elevated height with BUE on parallel bars  -Patient ambulated one small step forward on the RLE with  verbal cues to weight shift onto the stance limb before taking a step with the swing limb, sock donned on the LLE to decrease friction, pt with reports of bilateral knee discomfort  - Patient attempted four more STS from wheelchair from elevated height and with BUE support, but due to LE fatigue unable to complete transfer   -For stand to sit transfer, patient unable to achieve enough hip extension to sit all the way back in wheelchair and requires assistance to scoot backwards   -Seated hamstring curls with assist from SPT and slip disc 2x10 each LE -Seated hip abduction 2x10 each LE, with SPT providing tactile cue at knee to push out and against  -Orange medicine Ball Throws 2x10  -Seated basketball- In sitting, pt reaching across body with slight trunk rotation to retreive balls and then shooting into basketball hoop x25, challenging core stability   Pt requires significant therapeutic rest breaks throughout the session due to fatigue.  PATIENT EDUCATION: Education details: Pt educated throughout session about proper posture and technique with exercises. Improved exercise technique, movement at target joints, use of target muscles after min to mod verbal, visual, tactile cues.  Person educated: Patient Education method: Explanation, Demonstration, Tactile cues, and Verbal cues Education comprehension: verbalized understanding, returned demonstration, verbal cues required, and tactile cues required  HOME EXERCISE PROGRAM:  To be given at next visit.   GOALS: Goals reviewed with patient? Yes  SHORT TERM GOALS: Target date: 03/06/2023  Patient will be independent in home exercise program to improve strength/mobility for better functional independence with ADLs.  Baseline: 10/1: give next session 11/5:  HEP compliant Goal status: MET   LONG TERM GOALS: Target date: 05/01/2023  Patient will increase FOTO score to equal to or greater than 48 to demonstrate statistically significant  improvement in mobility and quality of life  Baseline: 10/1: 34%  11/5: 39.8%  Goal status: partially met   2.  Patient (> 5 years old) will complete five times sit to stand test in < 15 seconds indicating an increased LE strength and improved balance.  Baseline: 10/1; unable to tolerate two consecutive sit to stands 11/5: 2.19.26 5 partial sit to stands  Goal status: Partially Met  3.  Patient will increase 10 meter walk test to >0.5 m/s as to improve gait speed for better community ambulation and to reduce fall risk.  Baseline: 10/1: not tested this session 11/5: unable to stand from wheelchair fully to start walking.  Goal status: Ongoing  4.  Patient will transfer independently at home and no longer require use of Sara Plus Lift for increased independence.  Baseline: 10/1: reliant upon lift 11/5: reliant upon lift still  Goal status: partially met    ASSESSMENT:  CLINICAL IMPRESSION: Patient presents to therapy feeling slightly better compared to previous visit. Pt unable to take consecutive steps in today's visit and was limited by LE muscle fatigue, however BUE remain adequately strong for this task. Due to heavy UE use, // bars optimal location for transfers training this date, however pt unable to complete sit to stand transfer despite multiple attempts + author assist due to inability to achieve stable knee position bilaterally. Frequent rest breaks are required demonstrating a need to progress tolerance for activity. Pt requiring less assistance for seated hamstring curls on the LLE indicating improvements in LE strength. Pt will continue to benefit from skilled therapy to address remaining deficits in order to improve overall QoL and return to PLOF.     OBJECTIVE IMPAIRMENTS: Abnormal gait, decreased activity tolerance, decreased balance, decreased coordination, decreased endurance, decreased mobility, difficulty walking, decreased strength, impaired perceived functional ability,  improper body mechanics, and postural dysfunction.   ACTIVITY LIMITATIONS: carrying, lifting, bending, standing, squatting, sleeping, transfers, bed mobility, bathing, toileting, dressing, reach over head, hygiene/grooming, locomotion level, and caring for others  PARTICIPATION LIMITATIONS: meal prep, cleaning, laundry, driving, shopping, community activity, yard work, and church  PERSONAL FACTORS: back pain, CBP, chronic L shoulder pain, galactorrhea, neuropathy, HPV, hypercholestermeia, HTN, MS, osteopenia, PONV, wrist fracture are also affecting patient's functional outcome.   REHAB POTENTIAL: Good  CLINICAL DECISION MAKING: Evolving/moderate complexity  EVALUATION COMPLEXITY: Moderate  PLAN:  PT FREQUENCY: 2x/week  PT DURATION: 12 weeks  PLANNED INTERVENTIONS: Therapeutic exercises, Therapeutic activity, Neuromuscular re-education, Balance training, Gait training, Patient/Family education, Self Care, Joint mobilization, Stair training, Vestibular training, Canalith repositioning, Orthotic/Fit training, DME instructions, Dry Needling, Electrical stimulation, Spinal mobilization, Cryotherapy, Moist heat, Compression bandaging, scar mobilization, Taping, Traction, Ultrasound, Manual therapy, and Re-evaluation  PLAN FOR NEXT SESSION:  Gait training, LE strengthening, HEP as appropriate   Randon Goldsmith, Student-PT  This entire session was performed under direct supervision and direction of a licensed Estate agent . I have personally read, edited and approve of the note as written.   4:22 PM, 04/12/23 Rosamaria Lints, PT, DPT Physical Therapist - [place] [facility]  Outpatient Physical Therapy- Main Campus [phone]

## 2023-04-17 ENCOUNTER — Ambulatory Visit: Payer: PPO

## 2023-04-17 DIAGNOSIS — R262 Difficulty in walking, not elsewhere classified: Secondary | ICD-10-CM

## 2023-04-17 DIAGNOSIS — R2681 Unsteadiness on feet: Secondary | ICD-10-CM

## 2023-04-17 DIAGNOSIS — R278 Other lack of coordination: Secondary | ICD-10-CM

## 2023-04-17 DIAGNOSIS — M6281 Muscle weakness (generalized): Secondary | ICD-10-CM | POA: Diagnosis not present

## 2023-04-17 DIAGNOSIS — R269 Unspecified abnormalities of gait and mobility: Secondary | ICD-10-CM

## 2023-04-17 NOTE — Therapy (Signed)
OUTPATIENT PHYSICAL THERAPY NEURO TREATMENT     Patient Name: Lisa Williams MRN: [id number] DOB:07-Mar-1961, 62 y.o., female Today's Date: 04/17/2023   PCP: Lauro Regulus MD REFERRING PROVIDER: Lauro Regulus MD  END OF SESSION:  PT End of Session - 04/17/23 1511     Visit Number 19    Number of Visits 24    Date for PT Re-Evaluation 05/01/23    PT Start Time 1016    PT Stop Time 1059    PT Time Calculation (min) 43 min    Equipment Utilized During Treatment Gait belt    Activity Tolerance Patient tolerated treatment well;Patient limited by fatigue    Behavior During Therapy [facility] for tasks assessed/performed                Past Medical History:  Diagnosis Date   Abdominal pain, right upper quadrant    Back pain    Calculus of kidney 12/09/2013   Chronic back pain    unspecified   Chronic left shoulder pain 07/19/2015   Complication of anesthesia    Functional disorder of bladder    other   Galactorrhea 11/26/2014   Chronic    Hereditary and idiopathic neuropathy 08/19/2013   History of kidney stones    HPV test positive    Hypercholesteremia 08/19/2013   Hypertension    Incomplete bladder emptying    Microscopic hematuria    MS (multiple sclerosis) (HCC)    Muscle spasticity 05/21/2014   Nonspecific findings on examination of urine    other   Osteopenia    PONV (postoperative nausea and vomiting)    Status post laparoscopic supracervical hysterectomy 11/26/2014   Tobacco user 11/26/2014   Wrist fracture    Past Surgical History:  Procedure Laterality Date   bilateral tubal ligation  1996   BREAST CYST EXCISION Left 2002   CYST EXCISION Left 05/10/2022   Procedure: CYST REMOVAL;  Surgeon: Carolan Shiver, MD;  Location: [facility] ORS;  Service: General;  Laterality: Left;   FRACTURE SURGERY     KNEE SURGERY     right   LAPAROSCOPIC SUPRACERVICAL HYSTERECTOMY  08/05/2013   ORIF WRIST FRACTURE Left 01/17/2017   Procedure:  OPEN REDUCTION INTERNAL FIXATION (ORIF) WRIST FRACTURE;  Surgeon: Lyndle Herrlich, MD;  Location: [facility] OR[facility];  Service: Orthopedics;  Laterality: Left;   RADIOLOGY WITH ANESTHESIA N/A 03/18/2020   Procedure: MRI WITH ANESTHESIA CERVICAL SPINE AND BRAIN  WITH AND WITHOUT CONTRAST;  Surgeon: Radiologist, Medication, MD;  Location: [facility] OR;  Service: Radiology;  Laterality: N/A;   TUBAL LIGATION Bilateral    VAGINAL HYSTERECTOMY  03/2006   Patient Active Problem List   Diagnosis Date Noted   Hypokalemia 07/29/2022   Weakness of both lower extremities 07/29/2022   Abscess of left groin 11/15/2021   Abnormal LFTs 11/15/2021   Acute respiratory disease due to COVID-19 virus 11/21/2020   Weakness    Hypoalbuminemia due to protein-calorie malnutrition [facility])    Neurogenic bowel    Neurogenic bladder    Labile blood pressure    Neuropathic pain    Abscess of female pelvis    SVT (supraventricular tachycardia) (HCC)    Radial styloid tenosynovitis 03/12/2018   Wheelchair dependence 02/27/2018   Localized osteoporosis with current pathological fracture with routine healing 01/19/2017   Wrist fracture 01/16/2017   Sprain of ankle 03/23/2016   Closed fracture of lateral malleolus 03/16/2016   Health care maintenance 01/24/2016   Blood pressure elevated without history of  HTN 10/25/2015   Essential hypertension 10/25/2015   Multiple sclerosis (HCC) 10/02/2015   Chronic left shoulder pain 07/19/2015   Multiple sclerosis exacerbation (HCC) 07/14/2015   MS (multiple sclerosis) (HCC) 11/26/2014   Increased body mass index 11/26/2014   HPV test positive 11/26/2014   Status post laparoscopic supracervical hysterectomy 11/26/2014   Galactorrhea 11/26/2014   Back ache 05/21/2014   Adiposity 05/21/2014   Disordered sleep 05/21/2014   Muscle spasticity 05/21/2014   Spasticity 05/21/2014   Calculus of kidney 12/09/2013   Renal colic 12/09/2013   Hypercholesteremia 08/19/2013   Hereditary and  idiopathic neuropathy 08/19/2013   Hypercholesterolemia without hypertriglyceridemia 08/19/2013   Bladder infection, chronic 07/25/2012   Disorder of bladder function 07/25/2012   Incomplete bladder emptying 07/25/2012   Microscopic hematuria 07/25/2012   Right upper quadrant pain 07/25/2012    ONSET DATE: 1995  REFERRING DIAG: MS, weakness, other closed fracture of proximal end of R tibia  THERAPY DIAG:  Muscle weakness (generalized)  Difficulty in walking, not elsewhere classified  Unsteadiness on feet  Abnormality of gait and mobility  Other lack of coordination  Rationale for Evaluation and Treatment: Rehabilitation  SUBJECTIVE:                                                                                                                                                                                             SUBJECTIVE STATEMENT: Patient reports there have been no changes since her last visit. She is feeling better compared to last week, but still reports some discomfort in both knees.   Pt accompanied by: self  PERTINENT HISTORY: Patient is returning to PT s/p ORIF of R tibia shaft fracture 09/23/2022. Patient has weakness in BLE with RLE>LLE. She drives with hand controls. Patient has been diagnosed with MS in 1995. PMH includes: back pain, CBP, chronic L shoulder pain, galactorrhea, neuropathy, HPV, hypercholestermeia, HTN, MS, osteopenia, PONV, wrist fracture. Additional order for other closed fracture of proximal end of R tibia with routine healing. Still has to use [employer].   PAIN:  Are you having pain? No  PRECAUTIONS: Fall  RED FLAGS: None   WEIGHT BEARING RESTRICTIONS: No  FALLS: Has patient fallen in last 6 months? Yes. Number of falls 1  LIVING ENVIRONMENT: Lives with: lives with their family Lives in: House/apartment Stairs:  ramp from garage into house Has following equipment at home: Dan Humphreys - 2 wheeled, shower chair, Grab bars, and Ramped  entry  PLOF: Independent with household mobility with device  PATIENT GOALS: get up and pivot and turn, sit to stands. Return to walk, go to bathroom on her own  OBJECTIVE:  Note: Objective measures were completed at Evaluation unless otherwise noted.  DIAGNOSTIC FINDINGS: MRI of the brain 03/18/2020 showed multiple T2/FLAIR hyperintense foci in the periventricular, juxtacortical and deep white matter.  There were no infratentorial lesions noted.  None of the foci enhanced.   Due to severe claustrophobia, the study was done with conscious sedation in the hospital   SENSATION: Lack of sensation in feet, loss in bilateral lateral aspect of knee  LOWER EXTREMITY MMT:    MMT Right Eval Left Eval  Hip flexion 2- 2  Hip extension    Hip abduction 3+ 3+  Hip adduction 3 3  Hip internal rotation    Hip external rotation    Knee flexion 2 2+  Knee extension 2- 2  Ankle dorsiflexion 0 0  Ankle plantarflexion 0 0  Ankle inversion    Ankle eversion    (Blank rows = not tested)  TRANSFERS: Assistive device utilized: Environmental consultant - 2 wheeled  Sit to stand: CGA Stand to sit: CGA  FUNCTIONAL TESTS:  5 times sit to stand: unable to complete 2 consecutive sit to stands  10 meter walk test: perform in future session   PATIENT SURVEYS:  FOTO 34  TODAY'S TREATMENT: DATE: 04/17/23 All activities performed with gait belt and CGA unless otherwise noted.   In // bars   -Patient performed one STS from elevated height (cushion) with BUE on parallel bars  -Patient ambulated one small step forward on the RLE with verbal cues to weight shift onto the stance limb before taking a step with the swing limb, bilateral knee buckling present, deferred further ambulation   -Patient performed two STS from elevated height (cushion) with BUE on parallel bars   -Seated hamstring curls with assist from SPT and slip disc 3x10 each LE -Seated hip abduction 2x10 each LE, with SPT providing tactile cue at  knee to push out and against  -Seated cross body punches x40  -Seated cross body punches 2x40 with dynadisc under pt to challenge core stability, SPT performing knee block to prevent forward LOB  -Seated cross body punches + ducking with dynadisc 1x10, 1x15, SPT performing knee block to prevent forward LOB, pt with osterior lean towards last five reps of first set   Trigger Point Dry Needling (TDN), unbilled Education performed with patient regarding potential benefit of TDN. Reviewed precautions and risks with patient. Reviewed special precautions/risks over lung fields which include pneumothorax. Reviewed signs and symptoms of pneumothorax and advised pt to go to ER immediately if these symptoms develop advise them of dry needling treatment. Extensive time spent with pt to ensure full understanding of TDN risks. Pt provided verbal consent to treatment. TDN performed to  with 0.25 x 40 single needle placements with local twitch response (LTR). Pistoning technique utilized. Improved pain-free motion following intervention.    Pt requires significant therapeutic rest breaks throughout the session due to fatigue.  PATIENT EDUCATION: Education details: Pt educated throughout session about proper posture and technique with exercises. Improved exercise technique, movement at target joints, use of target muscles after min to mod verbal, visual, tactile cues.  Person educated: Patient Education method: Explanation, Demonstration, Tactile cues, and Verbal cues Education comprehension: verbalized understanding, returned demonstration, verbal cues required, and tactile cues required  HOME EXERCISE PROGRAM:  To be given at next visit.   GOALS: Goals reviewed with patient? Yes  SHORT TERM GOALS: Target date: 03/06/2023  Patient will be independent in home exercise program to improve strength/mobility for better  functional independence with ADLs.  Baseline: 10/1: give next session 11/5: HEP  compliant Goal status: MET   LONG TERM GOALS: Target date: 05/01/2023  Patient will increase FOTO score to equal to or greater than 48 to demonstrate statistically significant improvement in mobility and quality of life  Baseline: 10/1: 34%  11/5: 39.8%  Goal status: partially met   2.  Patient (> 79 years old) will complete five times sit to stand test in < 15 seconds indicating an increased LE strength and improved balance.  Baseline: 10/1; unable to tolerate two consecutive sit to stands 11/5: 2.19.26 5 partial sit to stands  Goal status: Partially Met  3.  Patient will increase 10 meter walk test to >0.5 m/s as to improve gait speed for better community ambulation and to reduce fall risk.  Baseline: 10/1: not tested this session 11/5: unable to stand from wheelchair fully to start walking.  Goal status: Ongoing  4.  Patient will transfer independently at home and no longer require use of Sara Plus Lift for increased independence.  Baseline: 10/1: reliant upon lift 11/5: reliant upon lift still  Goal status: partially met    ASSESSMENT:  CLINICAL IMPRESSION: Patient presents to therapy feeling better compared to last week visits. Pt unable to take consecutive steps in today's visit due to bilateral knee buckling. Due to heavy UE use, // bars optimal location for transfers training this date. Pt was able to perform three consecutive sit to stands which is an improvement from previous week. Frequent rest breaks are required demonstrating a need to progress tolerance for activity. Improved muscular endurance noted by increase in repetitions of seated hamstring curls. Pt will continue to benefit from skilled therapy to address remaining deficits in order to improve overall QoL and return to PLOF.     OBJECTIVE IMPAIRMENTS: Abnormal gait, decreased activity tolerance, decreased balance, decreased coordination, decreased endurance, decreased mobility, difficulty walking, decreased  strength, impaired perceived functional ability, improper body mechanics, and postural dysfunction.   ACTIVITY LIMITATIONS: carrying, lifting, bending, standing, squatting, sleeping, transfers, bed mobility, bathing, toileting, dressing, reach over head, hygiene/grooming, locomotion level, and caring for others  PARTICIPATION LIMITATIONS: meal prep, cleaning, laundry, driving, shopping, community activity, yard work, and church  PERSONAL FACTORS: back pain, CBP, chronic L shoulder pain, galactorrhea, neuropathy, HPV, hypercholestermeia, HTN, MS, osteopenia, PONV, wrist fracture are also affecting patient's functional outcome.   REHAB POTENTIAL: Good  CLINICAL DECISION MAKING: Evolving/moderate complexity  EVALUATION COMPLEXITY: Moderate  PLAN:  PT FREQUENCY: 2x/week  PT DURATION: 12 weeks  PLANNED INTERVENTIONS: Therapeutic exercises, Therapeutic activity, Neuromuscular re-education, Balance training, Gait training, Patient/Family education, Self Care, Joint mobilization, Stair training, Vestibular training, Canalith repositioning, Orthotic/Fit training, DME instructions, Dry Needling, Electrical stimulation, Spinal mobilization, Cryotherapy, Moist heat, Compression bandaging, scar mobilization, Taping, Traction, Ultrasound, Manual therapy, and Re-evaluation  PLAN FOR NEXT SESSION:  Gait training, LE strengthening, HEP as appropriate   Randon Goldsmith, Student-PT  This entire session was performed under direct supervision and direction of a licensed Estate agent . I have personally read, edited and approve of the note as written.   4:19 PM, 04/17/23 Precious Bard, PT, DPT Physical Therapist - [facility]  Outpatient Physical Therapy- Main Campus [phone]

## 2023-04-19 ENCOUNTER — Ambulatory Visit: Payer: PPO

## 2023-04-19 DIAGNOSIS — R262 Difficulty in walking, not elsewhere classified: Secondary | ICD-10-CM

## 2023-04-19 DIAGNOSIS — M6281 Muscle weakness (generalized): Secondary | ICD-10-CM

## 2023-04-19 DIAGNOSIS — R2681 Unsteadiness on feet: Secondary | ICD-10-CM

## 2023-04-19 DIAGNOSIS — R278 Other lack of coordination: Secondary | ICD-10-CM

## 2023-04-19 DIAGNOSIS — R269 Unspecified abnormalities of gait and mobility: Secondary | ICD-10-CM

## 2023-04-19 NOTE — Therapy (Signed)
OUTPATIENT PHYSICAL THERAPY NEURO TREATMENT/Physical Therapy Progress Note   Dates of reporting period  03/13/23   to   04/19/23      Patient Name: Lisa Williams MRN: [id number] DOB:December 15, 1960, 62 y.o., female Today's Date: 04/19/2023   PCP: Lauro Regulus MD REFERRING PROVIDER: Lauro Regulus MD  END OF SESSION:  PT End of Session - 04/19/23 0957     Visit Number 20    Number of Visits 24    Date for PT Re-Evaluation 05/01/23    PT Start Time 1015    PT Stop Time 1059    PT Time Calculation (min) 44 min    Equipment Utilized During Treatment Gait belt    Activity Tolerance Patient tolerated treatment well;Patient limited by fatigue    Behavior During Therapy [facility] for tasks assessed/performed                 Past Medical History:  Diagnosis Date   Abdominal pain, right upper quadrant    Back pain    Calculus of kidney 12/09/2013   Chronic back pain    unspecified   Chronic left shoulder pain 07/19/2015   Complication of anesthesia    Functional disorder of bladder    other   Galactorrhea 11/26/2014   Chronic    Hereditary and idiopathic neuropathy 08/19/2013   History of kidney stones    HPV test positive    Hypercholesteremia 08/19/2013   Hypertension    Incomplete bladder emptying    Microscopic hematuria    MS (multiple sclerosis) (HCC)    Muscle spasticity 05/21/2014   Nonspecific findings on examination of urine    other   Osteopenia    PONV (postoperative nausea and vomiting)    Status post laparoscopic supracervical hysterectomy 11/26/2014   Tobacco user 11/26/2014   Wrist fracture    Past Surgical History:  Procedure Laterality Date   bilateral tubal ligation  1996   BREAST CYST EXCISION Left 2002   CYST EXCISION Left 05/10/2022   Procedure: CYST REMOVAL;  Surgeon: Carolan Shiver, MD;  Location: [facility] ORS;  Service: General;  Laterality: Left;   FRACTURE SURGERY     KNEE SURGERY     right   LAPAROSCOPIC  SUPRACERVICAL HYSTERECTOMY  08/05/2013   ORIF WRIST FRACTURE Left 01/17/2017   Procedure: OPEN REDUCTION INTERNAL FIXATION (ORIF) WRIST FRACTURE;  Surgeon: Lyndle Herrlich, MD;  Location: [facility] ORS;  Service: Orthopedics;  Laterality: Left;   RADIOLOGY WITH ANESTHESIA N/A 03/18/2020   Procedure: MRI WITH ANESTHESIA CERVICAL SPINE AND BRAIN  WITH AND WITHOUT CONTRAST;  Surgeon: Radiologist, Medication, MD;  Location: [facility] OR;  Service: Radiology;  Laterality: N/A;   TUBAL LIGATION Bilateral    VAGINAL HYSTERECTOMY  03/2006   Patient Active Problem List   Diagnosis Date Noted   Hypokalemia 07/29/2022   Weakness of both lower extremities 07/29/2022   Abscess of left groin 11/15/2021   Abnormal LFTs 11/15/2021   Acute respiratory disease due to COVID-19 virus 11/21/2020   Weakness    Hypoalbuminemia due to protein-calorie malnutrition [facility])    Neurogenic bowel    Neurogenic bladder    Labile blood pressure    Neuropathic pain    Abscess of female pelvis    SVT (supraventricular tachycardia) (HCC)    Radial styloid tenosynovitis 03/12/2018   Wheelchair dependence 02/27/2018   Localized osteoporosis with current pathological fracture with routine healing 01/19/2017   Wrist fracture 01/16/2017   Sprain of ankle 03/23/2016   Closed  fracture of lateral malleolus 03/16/2016   Health care maintenance 01/24/2016   Blood pressure elevated without history of HTN 10/25/2015   Essential hypertension 10/25/2015   Multiple sclerosis (HCC) 10/02/2015   Chronic left shoulder pain 07/19/2015   Multiple sclerosis exacerbation (HCC) 07/14/2015   MS (multiple sclerosis) (HCC) 11/26/2014   Increased body mass index 11/26/2014   HPV test positive 11/26/2014   Status post laparoscopic supracervical hysterectomy 11/26/2014   Galactorrhea 11/26/2014   Back ache 05/21/2014   Adiposity 05/21/2014   Disordered sleep 05/21/2014   Muscle spasticity 05/21/2014   Spasticity 05/21/2014   Calculus of kidney  12/09/2013   Renal colic 12/09/2013   Hypercholesteremia 08/19/2013   Hereditary and idiopathic neuropathy 08/19/2013   Hypercholesterolemia without hypertriglyceridemia 08/19/2013   Bladder infection, chronic 07/25/2012   Disorder of bladder function 07/25/2012   Incomplete bladder emptying 07/25/2012   Microscopic hematuria 07/25/2012   Right upper quadrant pain 07/25/2012    ONSET DATE: 1995  REFERRING DIAG: MS, weakness, other closed fracture of proximal end of R tibia  THERAPY DIAG:  Muscle weakness (generalized)  Difficulty in walking, not elsewhere classified  Unsteadiness on feet  Abnormality of gait and mobility  Other lack of coordination  Rationale for Evaluation and Treatment: Rehabilitation  SUBJECTIVE:                                                                                                                                                                                             SUBJECTIVE STATEMENT: Patient reports there have been no changes since her last visit. She states the dry needling from last session was helpful.    Pt accompanied by: self  PERTINENT HISTORY: Patient is returning to PT s/p ORIF of R tibia shaft fracture 09/23/2022. Patient has weakness in BLE with RLE>LLE. She drives with hand controls. Patient has been diagnosed with MS in 1995. PMH includes: back pain, CBP, chronic L shoulder pain, galactorrhea, neuropathy, HPV, hypercholestermeia, HTN, MS, osteopenia, PONV, wrist fracture. Additional order for other closed fracture of proximal end of R tibia with routine healing. Still has to use [employer].   PAIN:  Are you having pain? No  PRECAUTIONS: Fall  RED FLAGS: None   WEIGHT BEARING RESTRICTIONS: No  FALLS: Has patient fallen in last 6 months? Yes. Number of falls 1  LIVING ENVIRONMENT: Lives with: lives with their family Lives in: House/apartment Stairs:  ramp from garage into house Has following equipment at home:  Dan Humphreys - 2 wheeled, shower chair, Grab bars, and Ramped entry  PLOF: Independent with household mobility with device  PATIENT GOALS: get up and pivot  and turn, sit to stands. Return to walk, go to bathroom on her own   OBJECTIVE:  Note: Objective measures were completed at Evaluation unless otherwise noted.  DIAGNOSTIC FINDINGS: MRI of the brain 03/18/2020 showed multiple T2/FLAIR hyperintense foci in the periventricular, juxtacortical and deep white matter.  There were no infratentorial lesions noted.  None of the foci enhanced.   Due to severe claustrophobia, the study was done with conscious sedation in the hospital   SENSATION: Lack of sensation in feet, loss in bilateral lateral aspect of knee  LOWER EXTREMITY MMT:    MMT Right Eval Left Eval  Hip flexion 2- 2  Hip extension    Hip abduction 3+ 3+  Hip adduction 3 3  Hip internal rotation    Hip external rotation    Knee flexion 2 2+  Knee extension 2- 2  Ankle dorsiflexion 0 0  Ankle plantarflexion 0 0  Ankle inversion    Ankle eversion    (Blank rows = not tested)  TRANSFERS: Assistive device utilized: Environmental consultant - 2 wheeled  Sit to stand: CGA Stand to sit: CGA  FUNCTIONAL TESTS:  5 times sit to stand: unable to complete 2 consecutive sit to stands  10 meter walk test: perform in future session   PATIENT SURVEYS:  FOTO 34  TODAY'S TREATMENT: DATE: 04/19/23 All activities performed with gait belt and CGA unless otherwise noted.   FOTO: 42% 5XSTS: 37.68s first rep reaching to Travelle Mcclimans, last four partial stands without bring UE to RW  -Patient performed five partial STS from elevated height (cushion) with RW, patient able to bring RUE from wheelchair to RW for fist four reps, but unable to decrease LUE support to complete transfer   -Seated med ball throws with orange medicine ball 1x10, 1x12 -Seated hamstring curls with assist from SPT and slip disc 3x10 each LE -Seated hip abduction 2x10 each LE, with SPT  providing tactile cue at knee to push out and against    Pt requires significant therapeutic rest breaks throughout the session due to fatigue.  PATIENT EDUCATION: Education details: Pt educated throughout session about proper posture and technique with exercises. Improved exercise technique, movement at target joints, use of target muscles after min to mod verbal, visual, tactile cues.  Person educated: Patient Education method: Explanation, Demonstration, Tactile cues, and Verbal cues Education comprehension: verbalized understanding, returned demonstration, verbal cues required, and tactile cues required  HOME EXERCISE PROGRAM:  To be given at next visit.   GOALS: Goals reviewed with patient? Yes  SHORT TERM GOALS: Target date: 03/06/2023  Patient will be independent in home exercise program to improve strength/mobility for better functional independence with ADLs.  Baseline: 10/1: give next session 11/5: HEP compliant Goal status: MET   LONG TERM GOALS: Target date: 05/01/2023  Patient will increase FOTO score to equal to or greater than 48 to demonstrate statistically significant improvement in mobility and quality of life  Baseline: 10/1: 34%  11/5: 39.8% 12/12: 42%   Goal status: partially met   2.  Patient (> 88 years old) will complete five times sit to stand test in < 15 seconds indicating an increased LE strength and improved balance.  Baseline: 10/1; unable to tolerate two consecutive sit to stands 11/5: 2.19.26 5 partial sit to stands 12/12: 37.68s;One full sit to stand bring UE to Arnika Larzelere, four partial STS without UE to Buelah Rennie  Goal status: Partially Met  3.  Patient will increase 10 meter walk test to >0.5 m/s as  to improve gait speed for better community ambulation and to reduce fall risk.  Baseline: 10/1: not tested this session 11/5: unable to stand from wheelchair fully to start walking. 12/12: not appropriate at this time  Goal status: Ongoing  4.  Patient  will transfer independently at home and no longer require use of Sara Plus Lift for increased independence.  Baseline: 10/1: reliant upon lift 11/5: reliant upon lift still 12/12: reliant upon lift still  Goal status: partially met    ASSESSMENT:  CLINICAL IMPRESSION: Patient presents to therapy highly motivated. Se reports benefits of the dry needling performed last session. Goals were reassessed this visit where she increased her FOTO score and decreased her time for the  Five Times Sit to Stand test. Although her time improved, she was unable to perform a full sit to stand bringing both UE's from her wheelchair to the Jerzi Tigert. She still requires use of the sarasteady lift for transfers at home demonstrating a need to progress functional mobility. The was deferred due to it not being appropriate at this time. Pt was able to tolerate increased repetitions of partial sit to stands this session indicating improved muscular endurance. Pt will continue to benefit from skilled therapy to address remaining deficits in order to improve overall QoL and return to PLOF.  Patient's condition has the potential to improve in response to therapy. Maximum improvement is yet to be obtained. The anticipated improvement is attainable and reasonable in a generally predictable time.     OBJECTIVE IMPAIRMENTS: Abnormal gait, decreased activity tolerance, decreased balance, decreased coordination, decreased endurance, decreased mobility, difficulty walking, decreased strength, impaired perceived functional ability, improper body mechanics, and postural dysfunction.   ACTIVITY LIMITATIONS: carrying, lifting, bending, standing, squatting, sleeping, transfers, bed mobility, bathing, toileting, dressing, reach over head, hygiene/grooming, locomotion level, and caring for others  PARTICIPATION LIMITATIONS: meal prep, cleaning, laundry, driving, shopping, community activity, yard work, and church  PERSONAL FACTORS: back  pain, CBP, chronic L shoulder pain, galactorrhea, neuropathy, HPV, hypercholestermeia, HTN, MS, osteopenia, PONV, wrist fracture are also affecting patient's functional outcome.   REHAB POTENTIAL: Good  CLINICAL DECISION MAKING: Evolving/moderate complexity  EVALUATION COMPLEXITY: Moderate  PLAN:  PT FREQUENCY: 2x/week  PT DURATION: 12 weeks  PLANNED INTERVENTIONS: Therapeutic exercises, Therapeutic activity, Neuromuscular re-education, Balance training, Gait training, Patient/Family education, Self Care, Joint mobilization, Stair training, Vestibular training, Canalith repositioning, Orthotic/Fit training, DME instructions, Dry Needling, Electrical stimulation, Spinal mobilization, Cryotherapy, Moist heat, Compression bandaging, scar mobilization, Taping, Traction, Ultrasound, Manual therapy, and Re-evaluation  PLAN FOR NEXT SESSION:  Gait training, LE strengthening, HEP as appropriate   Randon Goldsmith, Student-PT  This entire session was performed under direct supervision and direction of a licensed Estate agent . I have personally read, edited and approve of the note as written.   11:46 AM, 04/19/23 Precious Bard, PT, DPT Physical Therapist - [facility]  Outpatient Physical Therapy- Main Campus [phone]

## 2023-04-23 ENCOUNTER — Other Ambulatory Visit: Payer: Self-pay | Admitting: Internal Medicine

## 2023-04-23 DIAGNOSIS — Z1231 Encounter for screening mammogram for malignant neoplasm of breast: Secondary | ICD-10-CM

## 2023-04-23 NOTE — Therapy (Signed)
OUTPATIENT PHYSICAL THERAPY NEURO TREATMENT     Patient Name: Lisa Williams MRN: [id number] DOB:1960-05-25, 62 y.o., female Today's Date: 04/24/2023   PCP: Lauro Regulus MD REFERRING PROVIDER: Lauro Regulus MD  END OF SESSION:  PT End of Session - 04/24/23 1213     Visit Number 21    Number of Visits 24    Date for PT Re-Evaluation 05/01/23    PT Start Time 1015    PT Stop Time 1059    PT Time Calculation (min) 44 min    Equipment Utilized During Treatment Gait belt    Activity Tolerance Patient tolerated treatment well    Behavior During Therapy [facility] for tasks assessed/performed                  Past Medical History:  Diagnosis Date   Abdominal pain, right upper quadrant    Back pain    Calculus of kidney 12/09/2013   Chronic back pain    unspecified   Chronic left shoulder pain 07/19/2015   Complication of anesthesia    Functional disorder of bladder    other   Galactorrhea 11/26/2014   Chronic    Hereditary and idiopathic neuropathy 08/19/2013   History of kidney stones    HPV test positive    Hypercholesteremia 08/19/2013   Hypertension    Incomplete bladder emptying    Microscopic hematuria    MS (multiple sclerosis) (HCC)    Muscle spasticity 05/21/2014   Nonspecific findings on examination of urine    other   Osteopenia    PONV (postoperative nausea and vomiting)    Status post laparoscopic supracervical hysterectomy 11/26/2014   Tobacco user 11/26/2014   Wrist fracture    Past Surgical History:  Procedure Laterality Date   bilateral tubal ligation  1996   BREAST CYST EXCISION Left 2002   CYST EXCISION Left 05/10/2022   Procedure: CYST REMOVAL;  Surgeon: Carolan Shiver, MD;  Location: [facility] ORS;  Service: General;  Laterality: Left;   FRACTURE SURGERY     KNEE SURGERY     right   LAPAROSCOPIC SUPRACERVICAL HYSTERECTOMY  08/05/2013   ORIF WRIST FRACTURE Left 01/17/2017   Procedure: OPEN REDUCTION INTERNAL  FIXATION (ORIF) WRIST FRACTURE;  Surgeon: Lyndle Herrlich, MD;  Location: [facility];  Service: Orthopedics;  Laterality: Left;   RADIOLOGY WITH ANESTHESIA N/A 03/18/2020   Procedure: MRI WITH ANESTHESIA CERVICAL SPINE AND BRAIN  WITH AND WITHOUT CONTRAST;  Surgeon: Radiologist, Medication, MD;  Location: [facility] OR;  Service: Radiology;  Laterality: N/A;   TUBAL LIGATION Bilateral    VAGINAL HYSTERECTOMY  03/2006   Patient Active Problem List   Diagnosis Date Noted   Hypokalemia 07/29/2022   Weakness of both lower extremities 07/29/2022   Abscess of left groin 11/15/2021   Abnormal LFTs 11/15/2021   Acute respiratory disease due to COVID-19 virus 11/21/2020   Weakness    Hypoalbuminemia due to protein-calorie malnutrition [facility])    Neurogenic bowel    Neurogenic bladder    Labile blood pressure    Neuropathic pain    Abscess of female pelvis    SVT (supraventricular tachycardia) (HCC)    Radial styloid tenosynovitis 03/12/2018   Wheelchair dependence 02/27/2018   Localized osteoporosis with current pathological fracture with routine healing 01/19/2017   Wrist fracture 01/16/2017   Sprain of ankle 03/23/2016   Closed fracture of lateral malleolus 03/16/2016   Health care maintenance 01/24/2016   Blood pressure elevated without history of HTN  10/25/2015   Essential hypertension 10/25/2015   Multiple sclerosis (HCC) 10/02/2015   Chronic left shoulder pain 07/19/2015   Multiple sclerosis exacerbation (HCC) 07/14/2015   MS (multiple sclerosis) (HCC) 11/26/2014   Increased body mass index 11/26/2014   HPV test positive 11/26/2014   Status post laparoscopic supracervical hysterectomy 11/26/2014   Galactorrhea 11/26/2014   Back ache 05/21/2014   Adiposity 05/21/2014   Disordered sleep 05/21/2014   Muscle spasticity 05/21/2014   Spasticity 05/21/2014   Calculus of kidney 12/09/2013   Renal colic 12/09/2013   Hypercholesteremia 08/19/2013   Hereditary and idiopathic neuropathy 08/19/2013    Hypercholesterolemia without hypertriglyceridemia 08/19/2013   Bladder infection, chronic 07/25/2012   Disorder of bladder function 07/25/2012   Incomplete bladder emptying 07/25/2012   Microscopic hematuria 07/25/2012   Right upper quadrant pain 07/25/2012    ONSET DATE: 1995  REFERRING DIAG: MS, weakness, other closed fracture of proximal end of R tibia  THERAPY DIAG:  Muscle weakness (generalized)  Difficulty in walking, not elsewhere classified  Unsteadiness on feet  Other lack of coordination  Other abnormalities of gait and mobility  Abnormality of gait and mobility  Rationale for Evaluation and Treatment: Rehabilitation  SUBJECTIVE:                                                                                                                                                                                             SUBJECTIVE STATEMENT: Patient reports there have been no changes since her last visit.   Pt accompanied by: self  PERTINENT HISTORY: Patient is returning to PT s/p ORIF of R tibia shaft fracture 09/23/2022. Patient has weakness in BLE with RLE>LLE. She drives with hand controls. Patient has been diagnosed with MS in 1995. PMH includes: back pain, CBP, chronic L shoulder pain, galactorrhea, neuropathy, HPV, hypercholestermeia, HTN, MS, osteopenia, PONV, wrist fracture. Additional order for other closed fracture of proximal end of R tibia with routine healing. Still has to use [employer].   PAIN:  Are you having pain? No  PRECAUTIONS: Fall  RED FLAGS: None   WEIGHT BEARING RESTRICTIONS: No  FALLS: Has patient fallen in last 6 months? Yes. Number of falls 1  LIVING ENVIRONMENT: Lives with: lives with their family Lives in: House/apartment Stairs:  ramp from garage into house Has following equipment at home: Dan Humphreys - 2 wheeled, shower chair, Grab bars, and Ramped entry  PLOF: Independent with household mobility with device  PATIENT GOALS: get  up and pivot and turn, sit to stands. Return to walk, go to bathroom on her own   OBJECTIVE:  Note: Objective measures were completed at  Evaluation unless otherwise noted.  DIAGNOSTIC FINDINGS: MRI of the brain 03/18/2020 showed multiple T2/FLAIR hyperintense foci in the periventricular, juxtacortical and deep white matter.  There were no infratentorial lesions noted.  None of the foci enhanced.   Due to severe claustrophobia, the study was done with conscious sedation in the hospital   SENSATION: Lack of sensation in feet, loss in bilateral lateral aspect of knee  LOWER EXTREMITY MMT:    MMT Right Eval Left Eval  Hip flexion 2- 2  Hip extension    Hip abduction 3+ 3+  Hip adduction 3 3  Hip internal rotation    Hip external rotation    Knee flexion 2 2+  Knee extension 2- 2  Ankle dorsiflexion 0 0  Ankle plantarflexion 0 0  Ankle inversion    Ankle eversion    (Blank rows = not tested)  TRANSFERS: Assistive device utilized: Environmental consultant - 2 wheeled  Sit to stand: CGA Stand to sit: CGA  FUNCTIONAL TESTS:  5 times sit to stand: unable to complete 2 consecutive sit to stands  10 meter walk test: perform in future session   PATIENT SURVEYS:  FOTO 34  TODAY'S TREATMENT: DATE: 04/24/23 All activities performed with gait belt and CGA unless otherwise noted.  In // bars   -Patient performed seven STS from elevated height (cushion) with use of UE on parallel bar, pt able to achieve full upright posture and bring BUE in front on all attempts  -After first STS, pt ambulated four steps forward with verbal cues to weight shift onto stance leg prior to bringing swing leg forward   -Seated hamstring curls with assist from SPT and slip disc 3x10 each LE  -Seated basketball x15 shots, challenging pt's core stability by reaching to retrieve balls and shooting overhead  Pt requires significant therapeutic rest breaks throughout the session due to fatigue.  PATIENT  EDUCATION: Education details: Pt educated throughout session about proper posture and technique with exercises. Improved exercise technique, movement at target joints, use of target muscles after min to mod verbal, visual, tactile cues.  Person educated: Patient Education method: Explanation, Demonstration, Tactile cues, and Verbal cues Education comprehension: verbalized understanding, returned demonstration, verbal cues required, and tactile cues required  HOME EXERCISE PROGRAM:  To be given at next visit.   GOALS: Goals reviewed with patient? Yes  SHORT TERM GOALS: Target date: 03/06/2023  Patient will be independent in home exercise program to improve strength/mobility for better functional independence with ADLs.  Baseline: 10/1: give next session 11/5: HEP compliant Goal status: MET   LONG TERM GOALS: Target date: 05/01/2023  Patient will increase FOTO score to equal to or greater than 48 to demonstrate statistically significant improvement in mobility and quality of life  Baseline: 10/1: 34%  11/5: 39.8% 12/12: 42%   Goal status: partially met   2.  Patient (> 2 years old) will complete five times sit to stand test in < 15 seconds indicating an increased LE strength and improved balance.  Baseline: 10/1; unable to tolerate two consecutive sit to stands 11/5: 2.19.26 5 partial sit to stands 12/12: 37.68s;One full sit to stand bring UE to Jazmen Lindenbaum, four partial STS without UE to Kourtlynn Trevor  Goal status: Partially Met  3.  Patient will increase 10 meter walk test to >0.5 m/s as to improve gait speed for better community ambulation and to reduce fall risk.  Baseline: 10/1: not tested this session 11/5: unable to stand from wheelchair fully to start walking. 12/12:  not appropriate at this time  Goal status: Ongoing  4.  Patient will transfer independently at home and no longer require use of Sara Plus Lift for increased independence.  Baseline: 10/1: reliant upon lift 11/5: reliant  upon lift still 12/12: reliant upon lift still  Goal status: partially met    ASSESSMENT:  CLINICAL IMPRESSION: Patient presents to therapy highly motivated. Pt able to ambulate four steps forward in parallel bars which she has not been able to perform in over two weeks. She demonstrated improved functional mobility through performance of  multiple STS this session while achieving full upright posture. As fatigue increases, pt with knee buckling in standing and tremors throughout body. Pt will continue to benefit from skilled therapy to address remaining deficits in order to improve overall QoL and return to PLOF.   OBJECTIVE IMPAIRMENTS: Abnormal gait, decreased activity tolerance, decreased balance, decreased coordination, decreased endurance, decreased mobility, difficulty walking, decreased strength, impaired perceived functional ability, improper body mechanics, and postural dysfunction.   ACTIVITY LIMITATIONS: carrying, lifting, bending, standing, squatting, sleeping, transfers, bed mobility, bathing, toileting, dressing, reach over head, hygiene/grooming, locomotion level, and caring for others  PARTICIPATION LIMITATIONS: meal prep, cleaning, laundry, driving, shopping, community activity, yard work, and church  PERSONAL FACTORS: back pain, CBP, chronic L shoulder pain, galactorrhea, neuropathy, HPV, hypercholestermeia, HTN, MS, osteopenia, PONV, wrist fracture are also affecting patient's functional outcome.   REHAB POTENTIAL: Good  CLINICAL DECISION MAKING: Evolving/moderate complexity  EVALUATION COMPLEXITY: Moderate  PLAN:  PT FREQUENCY: 2x/week  PT DURATION: 12 weeks  PLANNED INTERVENTIONS: Therapeutic exercises, Therapeutic activity, Neuromuscular re-education, Balance training, Gait training, Patient/Family education, Self Care, Joint mobilization, Stair training, Vestibular training, Canalith repositioning, Orthotic/Fit training, DME instructions, Dry Needling, Electrical  stimulation, Spinal mobilization, Cryotherapy, Moist heat, Compression bandaging, scar mobilization, Taping, Traction, Ultrasound, Manual therapy, and Re-evaluation  PLAN FOR NEXT SESSION:  Gait training, LE strengthening, HEP as appropriate   Randon Goldsmith, Student-PT  This entire session was performed under direct supervision and direction of a licensed Estate agent . I have personally read, edited and approve of the note as written.   4:52 PM, 04/24/23 Precious Bard, PT, DPT Physical Therapist - [place] [facility]  Outpatient Physical Therapy- Main Campus [phone]

## 2023-04-24 ENCOUNTER — Ambulatory Visit: Payer: PPO

## 2023-04-24 DIAGNOSIS — M6281 Muscle weakness (generalized): Secondary | ICD-10-CM

## 2023-04-24 DIAGNOSIS — R2689 Other abnormalities of gait and mobility: Secondary | ICD-10-CM

## 2023-04-24 DIAGNOSIS — R262 Difficulty in walking, not elsewhere classified: Secondary | ICD-10-CM

## 2023-04-24 DIAGNOSIS — R278 Other lack of coordination: Secondary | ICD-10-CM

## 2023-04-24 DIAGNOSIS — R2681 Unsteadiness on feet: Secondary | ICD-10-CM

## 2023-04-24 DIAGNOSIS — R269 Unspecified abnormalities of gait and mobility: Secondary | ICD-10-CM

## 2023-04-26 ENCOUNTER — Ambulatory Visit: Payer: PPO

## 2023-04-26 DIAGNOSIS — M6281 Muscle weakness (generalized): Secondary | ICD-10-CM | POA: Diagnosis not present

## 2023-04-26 DIAGNOSIS — R2689 Other abnormalities of gait and mobility: Secondary | ICD-10-CM

## 2023-04-26 DIAGNOSIS — R262 Difficulty in walking, not elsewhere classified: Secondary | ICD-10-CM

## 2023-04-26 DIAGNOSIS — R269 Unspecified abnormalities of gait and mobility: Secondary | ICD-10-CM

## 2023-04-26 DIAGNOSIS — R278 Other lack of coordination: Secondary | ICD-10-CM

## 2023-04-26 DIAGNOSIS — R2681 Unsteadiness on feet: Secondary | ICD-10-CM

## 2023-04-26 NOTE — Therapy (Signed)
OUTPATIENT PHYSICAL THERAPY NEURO TREATMENT    Patient Name: Lisa Williams MRN: [id number] DOB:[birth year], 62 y.o., female Today's Date: 04/26/2023   PCP: Lauro Regulus MD REFERRING PROVIDER: Lauro Regulus MD  END OF SESSION:  PT End of Session - 04/26/23 1105     Visit Number 22    Number of Visits 24    Date for PT Re-Evaluation 05/01/23    PT Start Time 1015    PT Stop Time 1059    PT Time Calculation (min) 44 min    Equipment Utilized During Treatment Gait belt    Activity Tolerance Patient tolerated treatment well    Behavior During Therapy [facility] for tasks assessed/performed                   Past Medical History:  Diagnosis Date   Abdominal pain, right upper quadrant    Back pain    Calculus of kidney 12/09/2013   Chronic back pain    unspecified   Chronic left shoulder pain 07/19/2015   Complication of anesthesia    Functional disorder of bladder    other   Galactorrhea 11/26/2014   Chronic    Hereditary and idiopathic neuropathy 08/19/2013   History of kidney stones    HPV test positive    Hypercholesteremia 08/19/2013   Hypertension    Incomplete bladder emptying    Microscopic hematuria    MS (multiple sclerosis) (HCC)    Muscle spasticity 05/21/2014   Nonspecific findings on examination of urine    other   Osteopenia    PONV (postoperative nausea and vomiting)    Status post laparoscopic supracervical hysterectomy 11/26/2014   Tobacco user 11/26/2014   Wrist fracture    Past Surgical History:  Procedure Laterality Date   bilateral tubal ligation  1996   BREAST CYST EXCISION Left 2002   CYST EXCISION Left 05/10/2022   Procedure: CYST REMOVAL;  Surgeon: Carolan Shiver, MD;  Location: [facility] ORS;  Service: General;  Laterality: Left;   FRACTURE SURGERY     KNEE SURGERY     right   LAPAROSCOPIC SUPRACERVICAL HYSTERECTOMY  08/05/2013   ORIF WRIST FRACTURE Left 01/17/2017   Procedure: OPEN REDUCTION INTERNAL  FIXATION (ORIF) WRIST FRACTURE;  Surgeon: Lyndle Herrlich, MD;  Location: [facility] ORS;  Service: Orthopedics;  Laterality: Left;   RADIOLOGY WITH ANESTHESIA N/A 03/18/2020   Procedure: MRI WITH ANESTHESIA CERVICAL SPINE AND BRAIN  WITH AND WITHOUT CONTRAST;  Surgeon: Radiologist, Medication, MD;  Location: [facility] OR;  Service: Radiology;  Laterality: N/A;   TUBAL LIGATION Bilateral    VAGINAL HYSTERECTOMY  03/2006   Patient Active Problem List   Diagnosis Date Noted   Hypokalemia 07/29/2022   Weakness of both lower extremities 07/29/2022   Abscess of left groin 11/15/2021   Abnormal LFTs 11/15/2021   Acute respiratory disease due to COVID-19 virus 11/21/2020   Weakness    Hypoalbuminemia due to protein-calorie malnutrition [facility])    Neurogenic bowel    Neurogenic bladder    Labile blood pressure    Neuropathic pain    Abscess of female pelvis    SVT (supraventricular tachycardia) (HCC)    Radial styloid tenosynovitis 03/12/2018   Wheelchair dependence 02/27/2018   Localized osteoporosis with current pathological fracture with routine healing 01/19/2017   Wrist fracture 01/16/2017   Sprain of ankle 03/23/2016   Closed fracture of lateral malleolus 03/16/2016   Health care maintenance 01/24/2016   Blood pressure elevated without history of HTN  10/25/2015   Essential hypertension 10/25/2015   Multiple sclerosis (HCC) 10/02/2015   Chronic left shoulder pain 07/19/2015   Multiple sclerosis exacerbation (HCC) 07/14/2015   MS (multiple sclerosis) (HCC) 11/26/2014   Increased body mass index 11/26/2014   HPV test positive 11/26/2014   Status post laparoscopic supracervical hysterectomy 11/26/2014   Galactorrhea 11/26/2014   Back ache 05/21/2014   Adiposity 05/21/2014   Disordered sleep 05/21/2014   Muscle spasticity 05/21/2014   Spasticity 05/21/2014   Calculus of kidney 12/09/2013   Renal colic 12/09/2013   Hypercholesteremia 08/19/2013   Hereditary and idiopathic neuropathy 08/19/2013    Hypercholesterolemia without hypertriglyceridemia 08/19/2013   Bladder infection, chronic 07/25/2012   Disorder of bladder function 07/25/2012   Incomplete bladder emptying 07/25/2012   Microscopic hematuria 07/25/2012   Right upper quadrant pain 07/25/2012    ONSET DATE: 1995  REFERRING DIAG: MS, weakness, other closed fracture of proximal end of R tibia  THERAPY DIAG:  Muscle weakness (generalized)  Difficulty in walking, not elsewhere classified  Unsteadiness on feet  Other lack of coordination  Other abnormalities of gait and mobility  Abnormality of gait and mobility  Rationale for Evaluation and Treatment: Rehabilitation  SUBJECTIVE:                                                                                                                                                                                             SUBJECTIVE STATEMENT: Patient reports there have been no changes since her last visit.   Pt accompanied by: self  PERTINENT HISTORY: Patient is returning to PT s/p ORIF of R tibia shaft fracture 09/23/2022. Patient has weakness in BLE with RLE>LLE. She drives with hand controls. Patient has been diagnosed with MS in 1995. PMH includes: back pain, CBP, chronic L shoulder pain, galactorrhea, neuropathy, HPV, hypercholestermeia, HTN, MS, osteopenia, PONV, wrist fracture. Additional order for other closed fracture of proximal end of R tibia with routine healing. Still has to use [employer].   PAIN:  Are you having pain? No  PRECAUTIONS: Fall  RED FLAGS: None   WEIGHT BEARING RESTRICTIONS: No  FALLS: Has patient fallen in last 6 months? Yes. Number of falls 1  LIVING ENVIRONMENT: Lives with: lives with their family Lives in: House/apartment Stairs:  ramp from garage into house Has following equipment at home: Dan Humphreys - 2 wheeled, shower chair, Grab bars, and Ramped entry  PLOF: Independent with household mobility with device  PATIENT GOALS: get  up and pivot and turn, sit to stands. Return to walk, go to bathroom on her own   OBJECTIVE:  Note: Objective measures were completed at  Evaluation unless otherwise noted.  DIAGNOSTIC FINDINGS: MRI of the brain 03/18/2020 showed multiple T2/FLAIR hyperintense foci in the periventricular, juxtacortical and deep white matter.  There were no infratentorial lesions noted.  None of the foci enhanced.   Due to severe claustrophobia, the study was done with conscious sedation in the hospital   SENSATION: Lack of sensation in feet, loss in bilateral lateral aspect of knee  LOWER EXTREMITY MMT:    MMT Right Eval Left Eval  Hip flexion 2- 2  Hip extension    Hip abduction 3+ 3+  Hip adduction 3 3  Hip internal rotation    Hip external rotation    Knee flexion 2 2+  Knee extension 2- 2  Ankle dorsiflexion 0 0  Ankle plantarflexion 0 0  Ankle inversion    Ankle eversion    (Blank rows = not tested)  TRANSFERS: Assistive device utilized: Environmental consultant - 2 wheeled  Sit to stand: CGA Stand to sit: CGA  FUNCTIONAL TESTS:  5 times sit to stand: unable to complete 2 consecutive sit to stands  10 meter walk test: perform in future session   PATIENT SURVEYS:  FOTO 34  TODAY'S TREATMENT: DATE: 04/26/23 All activities performed with gait belt and CGA unless otherwise noted.   -Basketball Challenge- Pt propelled wheelchair 150 ft with two 180 degree turns, then reached forward outside BOS to retrieve mini balls and toss overhead into basketball hoop x3 minutes, shooting > 20 shots  -Patient performed ten STS from elevated height (cushion), pt able to bring one UE to Siddharth Babington on 3/10 attempts, rest breaks in between, pt able to achieve near full hip extension but unable to bring other UE forward  -Seated hamstring curls with assist from SPT and slip disc 3x10 each LE  -Seated blue medicine ball throws 1x15  Pt requires significant therapeutic rest breaks throughout the session due to  fatigue.  PATIENT EDUCATION: Education details: Pt educated throughout session about proper posture and technique with exercises. Improved exercise technique, movement at target joints, use of target muscles after min to mod verbal, visual, tactile cues.  Person educated: Patient Education method: Explanation, Demonstration, Tactile cues, and Verbal cues Education comprehension: verbalized understanding, returned demonstration, verbal cues required, and tactile cues required  HOME EXERCISE PROGRAM:  To be given at next visit.   GOALS: Goals reviewed with patient? Yes  SHORT TERM GOALS: Target date: 03/06/2023  Patient will be independent in home exercise program to improve strength/mobility for better functional independence with ADLs.  Baseline: 10/1: give next session 11/5: HEP compliant Goal status: MET   LONG TERM GOALS: Target date: 05/01/2023  Patient will increase FOTO score to equal to or greater than 48 to demonstrate statistically significant improvement in mobility and quality of life  Baseline: 10/1: 34%  11/5: 39.8% 12/12: 42%   Goal status: partially met   2.  Patient (> 77 years old) will complete five times sit to stand test in < 15 seconds indicating an increased LE strength and improved balance.  Baseline: 10/1; unable to tolerate two consecutive sit to stands 11/5: 2.19.26 5 partial sit to stands 12/12: 37.68s;One full sit to stand bring UE to Chamari Cutbirth, four partial STS without UE to Arnel Wymer  Goal status: Partially Met  3.  Patient will increase 10 meter walk test to >0.5 m/s as to improve gait speed for better community ambulation and to reduce fall risk.  Baseline: 10/1: not tested this session 11/5: unable to stand from wheelchair fully to  start walking. 12/12: not appropriate at this time  Goal status: Ongoing  4.  Patient will transfer independently at home and no longer require use of [employer] Plus Lift for increased independence.  Baseline: 10/1: reliant  upon lift 11/5: reliant upon lift still 12/12: reliant upon lift still  Goal status: partially met    ASSESSMENT:  CLINICAL IMPRESSION: Patient presents to therapy highly motivated. Pt with increased repetitions of sit to stands this session without use of parallel bar. Pt will continue to benefit from skilled therapy to address remaining deficits in order to improve overall QoL and return to PLOF.   OBJECTIVE IMPAIRMENTS: Abnormal gait, decreased activity tolerance, decreased balance, decreased coordination, decreased endurance, decreased mobility, difficulty walking, decreased strength, impaired perceived functional ability, improper body mechanics, and postural dysfunction.   ACTIVITY LIMITATIONS: carrying, lifting, bending, standing, squatting, sleeping, transfers, bed mobility, bathing, toileting, dressing, reach over head, hygiene/grooming, locomotion level, and caring for others  PARTICIPATION LIMITATIONS: meal prep, cleaning, laundry, driving, shopping, community activity, yard work, and church  PERSONAL FACTORS: back pain, CBP, chronic L shoulder pain, galactorrhea, neuropathy, HPV, hypercholestermeia, HTN, MS, osteopenia, PONV, wrist fracture are also affecting patient's functional outcome.   REHAB POTENTIAL: Good  CLINICAL DECISION MAKING: Evolving/moderate complexity  EVALUATION COMPLEXITY: Moderate  PLAN:  PT FREQUENCY: 2x/week  PT DURATION: 12 weeks  PLANNED INTERVENTIONS: Therapeutic exercises, Therapeutic activity, Neuromuscular re-education, Balance training, Gait training, Patient/Family education, Self Care, Joint mobilization, Stair training, Vestibular training, Canalith repositioning, Orthotic/Fit training, DME instructions, Dry Needling, Electrical stimulation, Spinal mobilization, Cryotherapy, Moist heat, Compression bandaging, scar mobilization, Taping, Traction, Ultrasound, Manual therapy, and Re-evaluation  PLAN FOR NEXT SESSION:  Gait training, LE  strengthening, HEP as appropriate   Randon Goldsmith, Student-PT  This entire session was performed under direct supervision and direction of a licensed Estate agent . I have personally read, edited and approve of the note as written.   4:04 PM, 04/26/23 Precious Bard, PT, DPT Physical Therapist - [place] [facility]  Outpatient Physical Therapy- Main Campus [phone]

## 2023-04-29 DIAGNOSIS — G35 Multiple sclerosis: Secondary | ICD-10-CM | POA: Diagnosis not present

## 2023-04-29 DIAGNOSIS — M810 Age-related osteoporosis without current pathological fracture: Secondary | ICD-10-CM | POA: Diagnosis not present

## 2023-04-29 DIAGNOSIS — R262 Difficulty in walking, not elsewhere classified: Secondary | ICD-10-CM | POA: Diagnosis not present

## 2023-04-29 DIAGNOSIS — M6281 Muscle weakness (generalized): Secondary | ICD-10-CM | POA: Diagnosis not present

## 2023-04-30 NOTE — Therapy (Signed)
OUTPATIENT PHYSICAL THERAPY NEURO TREATMENT/RECERT    Patient Name: Lisa Williams MRN: [id number] DOB:09/13/1960, 62 y.o., female Today's Date: 05/01/2023   PCP: Lauro Regulus MD REFERRING PROVIDER: Lauro Regulus MD  END OF SESSION:  PT End of Session - 05/01/23 1013     Visit Number 23    Number of Visits 47    Date for PT Re-Evaluation 07/24/23    PT Start Time 1015    PT Stop Time 1059    PT Time Calculation (min) 44 min    Equipment Utilized During Treatment Gait belt    Activity Tolerance Patient tolerated treatment well    Behavior During Therapy [facility] for tasks assessed/performed                    Past Medical History:  Diagnosis Date   Abdominal pain, right upper quadrant    Back pain    Calculus of kidney 12/09/2013   Chronic back pain    unspecified   Chronic left shoulder pain 07/19/2015   Complication of anesthesia    Functional disorder of bladder    other   Galactorrhea 11/26/2014   Chronic    Hereditary and idiopathic neuropathy 08/19/2013   History of kidney stones    HPV test positive    Hypercholesteremia 08/19/2013   Hypertension    Incomplete bladder emptying    Microscopic hematuria    MS (multiple sclerosis) (HCC)    Muscle spasticity 05/21/2014   Nonspecific findings on examination of urine    other   Osteopenia    PONV (postoperative nausea and vomiting)    Status post laparoscopic supracervical hysterectomy 11/26/2014   Tobacco user 11/26/2014   Wrist fracture    Past Surgical History:  Procedure Laterality Date   bilateral tubal ligation  1996   BREAST CYST EXCISION Left 2002   CYST EXCISION Left 05/10/2022   Procedure: CYST REMOVAL;  Surgeon: Carolan Shiver, MD;  Location: [facility] ORS;  Service: General;  Laterality: Left;   FRACTURE SURGERY     KNEE SURGERY     right   LAPAROSCOPIC SUPRACERVICAL HYSTERECTOMY  08/05/2013   ORIF WRIST FRACTURE Left 01/17/2017   Procedure: OPEN REDUCTION  INTERNAL FIXATION (ORIF) WRIST FRACTURE;  Surgeon: Lyndle Herrlich, MD;  Location: [facility] ORS;  Service: Orthopedics;  Laterality: Left;   RADIOLOGY WITH ANESTHESIA N/A 03/18/2020   Procedure: MRI WITH ANESTHESIA CERVICAL SPINE AND BRAIN  WITH AND WITHOUT CONTRAST;  Surgeon: Radiologist, Medication, MD;  Location: [facility] OR;  Service: Radiology;  Laterality: N/A;   TUBAL LIGATION Bilateral    VAGINAL HYSTERECTOMY  03/2006   Patient Active Problem List   Diagnosis Date Noted   Hypokalemia 07/29/2022   Weakness of both lower extremities 07/29/2022   Abscess of left groin 11/15/2021   Abnormal LFTs 11/15/2021   Acute respiratory disease due to COVID-19 virus 11/21/2020   Weakness    Hypoalbuminemia due to protein-calorie malnutrition [facility])    Neurogenic bowel    Neurogenic bladder    Labile blood pressure    Neuropathic pain    Abscess of female pelvis    SVT (supraventricular tachycardia) (HCC)    Radial styloid tenosynovitis 03/12/2018   Wheelchair dependence 02/27/2018   Localized osteoporosis with current pathological fracture with routine healing 01/19/2017   Wrist fracture 01/16/2017   Sprain of ankle 03/23/2016   Closed fracture of lateral malleolus 03/16/2016   Health care maintenance 01/24/2016   Blood pressure elevated without history of  HTN 10/25/2015   Essential hypertension 10/25/2015   Multiple sclerosis (HCC) 10/02/2015   Chronic left shoulder pain 07/19/2015   Multiple sclerosis exacerbation (HCC) 07/14/2015   MS (multiple sclerosis) (HCC) 11/26/2014   Increased body mass index 11/26/2014   HPV test positive 11/26/2014   Status post laparoscopic supracervical hysterectomy 11/26/2014   Galactorrhea 11/26/2014   Back ache 05/21/2014   Adiposity 05/21/2014   Disordered sleep 05/21/2014   Muscle spasticity 05/21/2014   Spasticity 05/21/2014   Calculus of kidney 12/09/2013   Renal colic 12/09/2013   Hypercholesteremia 08/19/2013   Hereditary and idiopathic neuropathy  08/19/2013   Hypercholesterolemia without hypertriglyceridemia 08/19/2013   Bladder infection, chronic 07/25/2012   Disorder of bladder function 07/25/2012   Incomplete bladder emptying 07/25/2012   Microscopic hematuria 07/25/2012   Right upper quadrant pain 07/25/2012    ONSET DATE: 1995  REFERRING DIAG: MS, weakness, other closed fracture of proximal end of R tibia  THERAPY DIAG:  Muscle weakness (generalized)  Difficulty in walking, not elsewhere classified  Unsteadiness on feet  Rationale for Evaluation and Treatment: Rehabilitation  SUBJECTIVE:                                                                                                                                                                                             SUBJECTIVE STATEMENT: Patient reports compliance with HEP. Is very motivated today.    Pt accompanied by: self  PERTINENT HISTORY: Patient is returning to PT s/p ORIF of R tibia shaft fracture 09/23/2022. Patient has weakness in BLE with RLE>LLE. She drives with hand controls. Patient has been diagnosed with MS in 1995. PMH includes: back pain, CBP, chronic L shoulder pain, galactorrhea, neuropathy, HPV, hypercholestermeia, HTN, MS, osteopenia, PONV, wrist fracture. Additional order for other closed fracture of proximal end of R tibia with routine healing. Still has to use [employer].   PAIN:  Are you having pain? No  PRECAUTIONS: Fall  RED FLAGS: None   WEIGHT BEARING RESTRICTIONS: No  FALLS: Has patient fallen in last 6 months? Yes. Number of falls 1  LIVING ENVIRONMENT: Lives with: lives with their family Lives in: House/apartment Stairs:  ramp from garage into house Has following equipment at home: Dan Humphreys - 2 wheeled, shower chair, Grab bars, and Ramped entry  PLOF: Independent with household mobility with device  PATIENT GOALS: get up and pivot and turn, sit to stands. Return to walk, go to bathroom on her own   OBJECTIVE:   Note: Objective measures were completed at Evaluation unless otherwise noted.  DIAGNOSTIC FINDINGS: MRI of the brain 03/18/2020 showed multiple T2/FLAIR hyperintense foci in  the periventricular, juxtacortical and deep white matter.  There were no infratentorial lesions noted.  None of the foci enhanced.   Due to severe claustrophobia, the study was done with conscious sedation in the hospital   SENSATION: Lack of sensation in feet, loss in bilateral lateral aspect of knee  LOWER EXTREMITY MMT:    MMT Right Eval Left Eval  Hip flexion 2- 2  Hip extension    Hip abduction 3+ 3+  Hip adduction 3 3  Hip internal rotation    Hip external rotation    Knee flexion 2 2+  Knee extension 2- 2  Ankle dorsiflexion 0 0  Ankle plantarflexion 0 0  Ankle inversion    Ankle eversion    (Blank rows = not tested)  TRANSFERS: Assistive device utilized: Environmental consultant - 2 wheeled  Sit to stand: CGA Stand to sit: CGA  FUNCTIONAL TESTS:  5 times sit to stand: unable to complete 2 consecutive sit to stands  10 meter walk test: perform in future session   PATIENT SURVEYS:  FOTO 34  TODAY'S TREATMENT: DATE: 05/01/23 All activities performed with gait belt and CGA unless otherwise noted.  In // bars: -sit to stand from standard height wheelchair; 3 trials of standing:  1st: static stand with weight shifts 10x  2nd: static stand posture control 10x  3rd: stand without UE support, 4 trials with last trial lasting 20 seconds  Seated:  1lb ankle weights on wrist: -flexion 10x each side -abduction 10x each side -90 raises 10x each side Anterior/posterior stretch for thoracic and lumbar mobility 10x   Pt requires significant therapeutic rest breaks throughout the session due to fatigue.  PATIENT EDUCATION: Education details: Pt educated throughout session about proper posture and technique with exercises. Improved exercise technique, movement at target joints, use of target muscles after min to mod  verbal, visual, tactile cues.  Person educated: Patient Education method: Explanation, Demonstration, Tactile cues, and Verbal cues Education comprehension: verbalized understanding, returned demonstration, verbal cues required, and tactile cues required  HOME EXERCISE PROGRAM:  To be given at next visit.   GOALS: Goals reviewed with patient? Yes  SHORT TERM GOALS: Target date: 03/06/2023  Patient will be independent in home exercise program to improve strength/mobility for better functional independence with ADLs.  Baseline: 10/1: give next session 11/5: HEP compliant Goal status: MET   LONG TERM GOALS: Target date:07/24/2023    Patient will increase FOTO score to equal to or greater than 48 to demonstrate statistically significant improvement in mobility and quality of life  Baseline: 10/1: 34%  11/5: 39.8% 12/12: 42%   Goal status: partially met   2.  Patient (> 23 years old) will complete five times sit to stand test in < 15 seconds indicating an increased LE strength and improved balance.  Baseline: 10/1; unable to tolerate two consecutive sit to stands 11/5: 2.19.26 5 partial sit to stands 12/12: 37.68s;One full sit to stand bring UE to walker, four partial STS without UE to walker  Goal status: Partially Met  3.  Patient will increase 10 meter walk test to >0.5 m/s as to improve gait speed for better community ambulation and to reduce fall risk.  Baseline: 10/1: not tested this session 11/5: unable to stand from wheelchair fully to start walking. 12/12: not appropriate at this time 12/24: takes 3 steps in previous sessions  Goal status: Ongoing  4.  Patient will transfer independently at home and no longer require use of Haematologist for  increased independence.  Baseline: 10/1: reliant upon lift 11/5: reliant upon lift still 12/12: reliant upon lift still  Goal status: partially met    ASSESSMENT:  CLINICAL IMPRESSION:  Patient's goals performed on 12/15 please  refer to this note for further details.  Patient tolerates standing for first time this session without UE support for a full 20 seconds without LOB indicating improved stability and LE strength. We will continue to focus on functional mobility and safe ADL performance at this time as she progresses each session. Pt will continue to benefit from skilled therapy to address remaining deficits in order to improve overall QoL and return to PLOF.   OBJECTIVE IMPAIRMENTS: Abnormal gait, decreased activity tolerance, decreased balance, decreased coordination, decreased endurance, decreased mobility, difficulty walking, decreased strength, impaired perceived functional ability, improper body mechanics, and postural dysfunction.   ACTIVITY LIMITATIONS: carrying, lifting, bending, standing, squatting, sleeping, transfers, bed mobility, bathing, toileting, dressing, reach over head, hygiene/grooming, locomotion level, and caring for others  PARTICIPATION LIMITATIONS: meal prep, cleaning, laundry, driving, shopping, community activity, yard work, and church  PERSONAL FACTORS: back pain, CBP, chronic L shoulder pain, galactorrhea, neuropathy, HPV, hypercholestermeia, HTN, MS, osteopenia, PONV, wrist fracture are also affecting patient's functional outcome.   REHAB POTENTIAL: Good  CLINICAL DECISION MAKING: Evolving/moderate complexity  EVALUATION COMPLEXITY: Moderate  PLAN:  PT FREQUENCY: 2x/week  PT DURATION: 12 weeks  PLANNED INTERVENTIONS: Therapeutic exercises, Therapeutic activity, Neuromuscular re-education, Balance training, Gait training, Patient/Family education, Self Care, Joint mobilization, Stair training, Vestibular training, Canalith repositioning, Orthotic/Fit training, DME instructions, Dry Needling, Electrical stimulation, Spinal mobilization, Cryotherapy, Moist heat, Compression bandaging, scar mobilization, Taping, Traction, Ultrasound, Manual therapy, and Re-evaluation  PLAN FOR NEXT  SESSION:  Gait training, LE strengthening, HEP as appropriate   11:13 AM, 05/01/23 Precious Bard, PT, DPT Physical Therapist - [place] [facility]  Outpatient Physical Therapy- Main Campus [phone]

## 2023-05-01 ENCOUNTER — Ambulatory Visit: Payer: PPO

## 2023-05-01 DIAGNOSIS — R262 Difficulty in walking, not elsewhere classified: Secondary | ICD-10-CM

## 2023-05-01 DIAGNOSIS — M6281 Muscle weakness (generalized): Secondary | ICD-10-CM | POA: Diagnosis not present

## 2023-05-01 DIAGNOSIS — R2681 Unsteadiness on feet: Secondary | ICD-10-CM

## 2023-05-01 NOTE — Therapy (Signed)
OUTPATIENT PHYSICAL THERAPY NEURO TREATMENT    Patient Name: Lisa Williams MRN: [id number] DOB:[birth year], 62 y.o., female Today's Date: 05/03/2023   PCP: Lauro Regulus MD REFERRING PROVIDER: Lauro Regulus MD  END OF SESSION:  PT End of Session - 05/03/23 1014     Visit Number 24    Number of Visits 47    Date for PT Re-Evaluation 07/24/23    PT Start Time 1015    PT Stop Time 1059    PT Time Calculation (min) 44 min    Equipment Utilized During Treatment Gait belt    Activity Tolerance Patient tolerated treatment well    Behavior During Therapy [facility] for tasks assessed/performed                     Past Medical History:  Diagnosis Date   Abdominal pain, right upper quadrant    Back pain    Calculus of kidney 12/09/2013   Chronic back pain    unspecified   Chronic left shoulder pain 07/19/2015   Complication of anesthesia    Functional disorder of bladder    other   Galactorrhea 11/26/2014   Chronic    Hereditary and idiopathic neuropathy 08/19/2013   History of kidney stones    HPV test positive    Hypercholesteremia 08/19/2013   Hypertension    Incomplete bladder emptying    Microscopic hematuria    MS (multiple sclerosis) (HCC)    Muscle spasticity 05/21/2014   Nonspecific findings on examination of urine    other   Osteopenia    PONV (postoperative nausea and vomiting)    Status post laparoscopic supracervical hysterectomy 11/26/2014   Tobacco user 11/26/2014   Wrist fracture    Past Surgical History:  Procedure Laterality Date   bilateral tubal ligation  1996   BREAST CYST EXCISION Left 2002   CYST EXCISION Left 05/10/2022   Procedure: CYST REMOVAL;  Surgeon: Carolan Shiver, MD;  Location: [facility] ORS;  Service: General;  Laterality: Left;   FRACTURE SURGERY     KNEE SURGERY     right   LAPAROSCOPIC SUPRACERVICAL HYSTERECTOMY  08/05/2013   ORIF WRIST FRACTURE Left 01/17/2017   Procedure: OPEN REDUCTION  INTERNAL FIXATION (ORIF) WRIST FRACTURE;  Surgeon: Lyndle Herrlich, MD;  Location: [facility] ORS;  Service: Orthopedics;  Laterality: Left;   RADIOLOGY WITH ANESTHESIA N/A 03/18/2020   Procedure: MRI WITH ANESTHESIA CERVICAL SPINE AND BRAIN  WITH AND WITHOUT CONTRAST;  Surgeon: Radiologist, Medication, MD;  Location: [facility] OR;  Service: Radiology;  Laterality: N/A;   TUBAL LIGATION Bilateral    VAGINAL HYSTERECTOMY  03/2006   Patient Active Problem List   Diagnosis Date Noted   Hypokalemia 07/29/2022   Weakness of both lower extremities 07/29/2022   Abscess of left groin 11/15/2021   Abnormal LFTs 11/15/2021   Acute respiratory disease due to COVID-19 virus 11/21/2020   Weakness    Hypoalbuminemia due to protein-calorie malnutrition [facility])    Neurogenic bowel    Neurogenic bladder    Labile blood pressure    Neuropathic pain    Abscess of female pelvis    SVT (supraventricular tachycardia) (HCC)    Radial styloid tenosynovitis 03/12/2018   Wheelchair dependence 02/27/2018   Localized osteoporosis with current pathological fracture with routine healing 01/19/2017   Wrist fracture 01/16/2017   Sprain of ankle 03/23/2016   Closed fracture of lateral malleolus 03/16/2016   Health care maintenance 01/24/2016   Blood pressure elevated without history  of HTN 10/25/2015   Essential hypertension 10/25/2015   Multiple sclerosis (HCC) 10/02/2015   Chronic left shoulder pain 07/19/2015   Multiple sclerosis exacerbation (HCC) 07/14/2015   MS (multiple sclerosis) (HCC) 11/26/2014   Increased body mass index 11/26/2014   HPV test positive 11/26/2014   Status post laparoscopic supracervical hysterectomy 11/26/2014   Galactorrhea 11/26/2014   Back ache 05/21/2014   Adiposity 05/21/2014   Disordered sleep 05/21/2014   Muscle spasticity 05/21/2014   Spasticity 05/21/2014   Calculus of kidney 12/09/2013   Renal colic 12/09/2013   Hypercholesteremia 08/19/2013   Hereditary and idiopathic neuropathy  08/19/2013   Hypercholesterolemia without hypertriglyceridemia 08/19/2013   Bladder infection, chronic 07/25/2012   Disorder of bladder function 07/25/2012   Incomplete bladder emptying 07/25/2012   Microscopic hematuria 07/25/2012   Right upper quadrant pain 07/25/2012    ONSET DATE: 1995  REFERRING DIAG: MS, weakness, other closed fracture of proximal end of R tibia  THERAPY DIAG:  Muscle weakness (generalized)  Difficulty in walking, not elsewhere classified  Unsteadiness on feet  Other lack of coordination  Rationale for Evaluation and Treatment: Rehabilitation  SUBJECTIVE:                                                                                                                                                                                             SUBJECTIVE STATEMENT: Patient reports she is tired from christmas.    Pt accompanied by: self  PERTINENT HISTORY: Patient is returning to PT s/p ORIF of R tibia shaft fracture 09/23/2022. Patient has weakness in BLE with RLE>LLE. She drives with hand controls. Patient has been diagnosed with MS in 1995. PMH includes: back pain, CBP, chronic L shoulder pain, galactorrhea, neuropathy, HPV, hypercholestermeia, HTN, MS, osteopenia, PONV, wrist fracture. Additional order for other closed fracture of proximal end of R tibia with routine healing. Still has to use [employer].   PAIN:  Are you having pain? No  PRECAUTIONS: Fall  RED FLAGS: None   WEIGHT BEARING RESTRICTIONS: No  FALLS: Has patient fallen in last 6 months? Yes. Number of falls 1  LIVING ENVIRONMENT: Lives with: lives with their family Lives in: House/apartment Stairs:  ramp from garage into house Has following equipment at home: Dan Humphreys - 2 wheeled, shower chair, Grab bars, and Ramped entry  PLOF: Independent with household mobility with device  PATIENT GOALS: get up and pivot and turn, sit to stands. Return to walk, go to bathroom on her own    OBJECTIVE:  Note: Objective measures were completed at Evaluation unless otherwise noted.  DIAGNOSTIC FINDINGS: MRI of the brain 03/18/2020 showed multiple  T2/FLAIR hyperintense foci in the periventricular, juxtacortical and deep white matter.  There were no infratentorial lesions noted.  None of the foci enhanced.   Due to severe claustrophobia, the study was done with conscious sedation in the hospital   SENSATION: Lack of sensation in feet, loss in bilateral lateral aspect of knee  LOWER EXTREMITY MMT:    MMT Right Eval Left Eval  Hip flexion 2- 2  Hip extension    Hip abduction 3+ 3+  Hip adduction 3 3  Hip internal rotation    Hip external rotation    Knee flexion 2 2+  Knee extension 2- 2  Ankle dorsiflexion 0 0  Ankle plantarflexion 0 0  Ankle inversion    Ankle eversion    (Blank rows = not tested)  TRANSFERS: Assistive device utilized: Environmental consultant - 2 wheeled  Sit to stand: CGA Stand to sit: CGA  FUNCTIONAL TESTS:  5 times sit to stand: unable to complete 2 consecutive sit to stands  10 meter walk test: perform in future session   PATIENT SURVEYS:  FOTO 34  TODAY'S TREATMENT: DATE: 05/03/23 All activities performed with gait belt and CGA unless otherwise noted.  In // bars: Attempt to stand unsuccessful   Seated: Hamstring curl AAROm with slider 10x each LE RTB abduction 10x each LE; 2 sets RTB hamstring curl 10x each LE 2 sets Knee press into PT leg 10x each LE for contract/relax Hamstring stretch 60 seconds each LE IT band stretch 60 seconds each LE RTB cross body activation 10x Tricep stretch 60 seconds each UE Bicep stretch 60 seconds each UE Upper trap stretch 60 seconds each side    Pt requires significant therapeutic rest breaks throughout the session due to fatigue.  PATIENT EDUCATION: Education details: Pt educated throughout session about proper posture and technique with exercises. Improved exercise technique, movement at target joints,  use of target muscles after min to mod verbal, visual, tactile cues.  Person educated: Patient Education method: Explanation, Demonstration, Tactile cues, and Verbal cues Education comprehension: verbalized understanding, returned demonstration, verbal cues required, and tactile cues required  HOME EXERCISE PROGRAM:  To be given at next visit.   GOALS: Goals reviewed with patient? Yes  SHORT TERM GOALS: Target date: 03/06/2023  Patient will be independent in home exercise program to improve strength/mobility for better functional independence with ADLs.  Baseline: 10/1: give next session 11/5: HEP compliant Goal status: MET   LONG TERM GOALS: Target date:07/24/2023    Patient will increase FOTO score to equal to or greater than 48 to demonstrate statistically significant improvement in mobility and quality of life  Baseline: 10/1: 34%  11/5: 39.8% 12/12: 42%   Goal status: partially met   2.  Patient (> 47 years old) will complete five times sit to stand test in < 15 seconds indicating an increased LE strength and improved balance.  Baseline: 10/1; unable to tolerate two consecutive sit to stands 11/5: 2.19.26 5 partial sit to stands 12/12: 37.68s;One full sit to stand bring UE to walker, four partial STS without UE to walker  Goal status: Partially Met  3.  Patient will increase 10 meter walk test to >0.5 m/s as to improve gait speed for better community ambulation and to reduce fall risk.  Baseline: 10/1: not tested this session 11/5: unable to stand from wheelchair fully to start walking. 12/12: not appropriate at this time 12/24: takes 3 steps in previous sessions  Goal status: Ongoing  4.  Patient will transfer  independently at home and no longer require use of [employer] for increased independence.  Baseline: 10/1: reliant upon lift 11/5: reliant upon lift still 12/12: reliant upon lift still  Goal status: partially met    ASSESSMENT:  CLINICAL  IMPRESSION: Patient very fatigued, unable to stand this session but tolerates seated exercises well with rest breaks. Gentle strengthening educated on and performed this session for pain reduction/tension reduction. Patient remains motivated despite fatigue.  Pt will continue to benefit from skilled therapy to address remaining deficits in order to improve overall QoL and return to PLOF.   OBJECTIVE IMPAIRMENTS: Abnormal gait, decreased activity tolerance, decreased balance, decreased coordination, decreased endurance, decreased mobility, difficulty walking, decreased strength, impaired perceived functional ability, improper body mechanics, and postural dysfunction.   ACTIVITY LIMITATIONS: carrying, lifting, bending, standing, squatting, sleeping, transfers, bed mobility, bathing, toileting, dressing, reach over head, hygiene/grooming, locomotion level, and caring for others  PARTICIPATION LIMITATIONS: meal prep, cleaning, laundry, driving, shopping, community activity, yard work, and church  PERSONAL FACTORS: back pain, CBP, chronic L shoulder pain, galactorrhea, neuropathy, HPV, hypercholestermeia, HTN, MS, osteopenia, PONV, wrist fracture are also affecting patient's functional outcome.   REHAB POTENTIAL: Good  CLINICAL DECISION MAKING: Evolving/moderate complexity  EVALUATION COMPLEXITY: Moderate  PLAN:  PT FREQUENCY: 2x/week  PT DURATION: 12 weeks  PLANNED INTERVENTIONS: Therapeutic exercises, Therapeutic activity, Neuromuscular re-education, Balance training, Gait training, Patient/Family education, Self Care, Joint mobilization, Stair training, Vestibular training, Canalith repositioning, Orthotic/Fit training, DME instructions, Dry Needling, Electrical stimulation, Spinal mobilization, Cryotherapy, Moist heat, Compression bandaging, scar mobilization, Taping, Traction, Ultrasound, Manual therapy, and Re-evaluation  PLAN FOR NEXT SESSION:  Gait training, LE strengthening, HEP as  appropriate   12:37 PM, 05/03/23 Precious Bard, PT, DPT Physical Therapist - [facility]  Outpatient Physical Therapy- [facility] [phone]

## 2023-05-03 ENCOUNTER — Ambulatory Visit: Payer: PPO

## 2023-05-03 DIAGNOSIS — R2681 Unsteadiness on feet: Secondary | ICD-10-CM

## 2023-05-03 DIAGNOSIS — R278 Other lack of coordination: Secondary | ICD-10-CM

## 2023-05-03 DIAGNOSIS — R262 Difficulty in walking, not elsewhere classified: Secondary | ICD-10-CM

## 2023-05-03 DIAGNOSIS — M6281 Muscle weakness (generalized): Secondary | ICD-10-CM

## 2023-05-07 NOTE — Therapy (Signed)
 OUTPATIENT PHYSICAL THERAPY NEURO TREATMENT    Patient Name: Lisa Williams MRN: [id number] DOB:1960-11-02, 62 y.o., female Today's Date: 05/08/2023   PCP: Lenon Layman ORN MD REFERRING PROVIDER: Lenon Layman ORN MD  END OF SESSION:  PT End of Session - 05/08/23 1010     Visit Number 25    Number of Visits 47    Date for PT Re-Evaluation 07/24/23    PT Start Time 1015    PT Stop Time 1059    PT Time Calculation (min) 44 min    Equipment Utilized During Treatment Gait belt    Activity Tolerance Patient tolerated treatment well    Behavior During Therapy [facility] for tasks assessed/performed                      Past Medical History:  Diagnosis Date   Abdominal pain, right upper quadrant    Back pain    Calculus of kidney 12/09/2013   Chronic back pain    unspecified   Chronic left shoulder pain 07/19/2015   Complication of anesthesia    Functional disorder of bladder    other   Galactorrhea 11/26/2014   Chronic    Hereditary and idiopathic neuropathy 08/19/2013   History of kidney stones    HPV test positive    Hypercholesteremia 08/19/2013   Hypertension    Incomplete bladder emptying    Microscopic hematuria    MS (multiple sclerosis) (HCC)    Muscle spasticity 05/21/2014   Nonspecific findings on examination of urine    other   Osteopenia    PONV (postoperative nausea and vomiting)    Status post laparoscopic supracervical hysterectomy 11/26/2014   Tobacco user 11/26/2014   Wrist fracture    Past Surgical History:  Procedure Laterality Date   bilateral tubal ligation  1996   BREAST CYST EXCISION Left 2002   CYST EXCISION Left 05/10/2022   Procedure: CYST REMOVAL;  Surgeon: Rodolph Romano, MD;  Location: [facility] ORS;  Service: General;  Laterality: Left;   FRACTURE SURGERY     KNEE SURGERY     right   LAPAROSCOPIC SUPRACERVICAL HYSTERECTOMY  08/05/2013   ORIF WRIST FRACTURE Left 01/17/2017   Procedure: OPEN REDUCTION  INTERNAL FIXATION (ORIF) WRIST FRACTURE;  Surgeon: Leora Lynwood SAUNDERS, MD;  Location: [facility] ORS;  Service: Orthopedics;  Laterality: Left;   RADIOLOGY WITH ANESTHESIA N/A 03/18/2020   Procedure: MRI WITH ANESTHESIA CERVICAL SPINE AND BRAIN  WITH AND WITHOUT CONTRAST;  Surgeon: Radiologist, Medication, MD;  Location: [facility] OR;  Service: Radiology;  Laterality: N/A;   TUBAL LIGATION Bilateral    VAGINAL HYSTERECTOMY  03/2006   Patient Active Problem List   Diagnosis Date Noted   Hypokalemia 07/29/2022   Weakness of both lower extremities 07/29/2022   Abscess of left groin 11/15/2021   Abnormal LFTs 11/15/2021   Acute respiratory disease due to COVID-19 virus 11/21/2020   Weakness    Hypoalbuminemia due to protein-calorie malnutrition [facility])    Neurogenic bowel    Neurogenic bladder    Labile blood pressure    Neuropathic pain    Abscess of female pelvis    SVT (supraventricular tachycardia) (HCC)    Radial styloid tenosynovitis 03/12/2018   Wheelchair dependence 02/27/2018   Localized osteoporosis with current pathological fracture with routine healing 01/19/2017   Wrist fracture 01/16/2017   Sprain of ankle 03/23/2016   Closed fracture of lateral malleolus 03/16/2016   Health care maintenance 01/24/2016   Blood pressure elevated without  history of HTN 10/25/2015   Essential hypertension 10/25/2015   Multiple sclerosis (HCC) 10/02/2015   Chronic left shoulder pain 07/19/2015   Multiple sclerosis exacerbation (HCC) 07/14/2015   MS (multiple sclerosis) (HCC) 11/26/2014   Increased body mass index 11/26/2014   HPV test positive 11/26/2014   Status post laparoscopic supracervical hysterectomy 11/26/2014   Galactorrhea 11/26/2014   Back ache 05/21/2014   Adiposity 05/21/2014   Disordered sleep 05/21/2014   Muscle spasticity 05/21/2014   Spasticity 05/21/2014   Calculus of kidney 12/09/2013   Renal colic 12/09/2013   Hypercholesteremia 08/19/2013   Hereditary and idiopathic neuropathy  08/19/2013   Hypercholesterolemia without hypertriglyceridemia 08/19/2013   Bladder infection, chronic 07/25/2012   Disorder of bladder function 07/25/2012   Incomplete bladder emptying 07/25/2012   Microscopic hematuria 07/25/2012   Right upper quadrant pain 07/25/2012    ONSET DATE: 1995  REFERRING DIAG: MS, weakness, other closed fracture of proximal end of R tibia  THERAPY DIAG:  Muscle weakness (generalized)  Difficulty in walking, not elsewhere classified  Unsteadiness on feet  Other lack of coordination  Rationale for Evaluation and Treatment: Rehabilitation  SUBJECTIVE:                                                                                                                                                                                             SUBJECTIVE STATEMENT: Patient reports she is feeling good.    Pt accompanied by: self  PERTINENT HISTORY: Patient is returning to PT s/p ORIF of R tibia shaft fracture 09/23/2022. Patient has weakness in BLE with RLE>LLE. She drives with hand controls. Patient has been diagnosed with MS in 1995. PMH includes: back pain, CBP, chronic L shoulder pain, galactorrhea, neuropathy, HPV, hypercholestermeia, HTN, MS, osteopenia, PONV, wrist fracture. Additional order for other closed fracture of proximal end of R tibia with routine healing. Still has to use [employer].   PAIN:  Are you having pain? No  PRECAUTIONS: Fall  RED FLAGS: None   WEIGHT BEARING RESTRICTIONS: No  FALLS: Has patient fallen in last 6 months? Yes. Number of falls 1  LIVING ENVIRONMENT: Lives with: lives with their family Lives in: House/apartment Stairs:  ramp from garage into house Has following equipment at home: Vannie - 2 wheeled, shower chair, Grab bars, and Ramped entry  PLOF: Independent with household mobility with device  PATIENT GOALS: get up and pivot and turn, sit to stands. Return to walk, go to bathroom on her own   OBJECTIVE:   Note: Objective measures were completed at Evaluation unless otherwise noted.  DIAGNOSTIC FINDINGS: MRI of the brain 03/18/2020 showed multiple  T2/FLAIR hyperintense foci in the periventricular, juxtacortical and deep white matter.  There were no infratentorial lesions noted.  None of the foci enhanced.   Due to severe claustrophobia, the study was done with conscious sedation in the hospital   SENSATION: Lack of sensation in feet, loss in bilateral lateral aspect of knee  LOWER EXTREMITY MMT:    MMT Right Eval Left Eval  Hip flexion 2- 2  Hip extension    Hip abduction 3+ 3+  Hip adduction 3 3  Hip internal rotation    Hip external rotation    Knee flexion 2 2+  Knee extension 2- 2  Ankle dorsiflexion 0 0  Ankle plantarflexion 0 0  Ankle inversion    Ankle eversion    (Blank rows = not tested)  TRANSFERS: Assistive device utilized: Environmental Consultant - 2 wheeled  Sit to stand: CGA Stand to sit: CGA  FUNCTIONAL TESTS:  5 times sit to stand: unable to complete 2 consecutive sit to stands  10 meter walk test: perform in future session   PATIENT SURVEYS:  FOTO 34  TODAY'S TREATMENT: DATE: 05/08/23 All activities performed with gait belt and CGA unless otherwise noted.  In // bars: Sit to stand x6 trials -1st: R foot forward/backward 8x -2nd: L foot attempt forwards/backward unsuccessful -3rd: L foot forward/backward 4x successful with focus on weight shift onto RLE -4th -6th: static stand to try to reduce UE support  Seated: Hamstring curl AROM with slider 10x each LE RTB abduction 10x each LE; 2 sets RTB adduction 10 x each LE     Pt requires significant therapeutic rest breaks throughout the session due to fatigue.  PATIENT EDUCATION: Education details: Pt educated throughout session about proper posture and technique with exercises. Improved exercise technique, movement at target joints, use of target muscles after min to mod verbal, visual, tactile cues.  Person  educated: Patient Education method: Explanation, Demonstration, Tactile cues, and Verbal cues Education comprehension: verbalized understanding, returned demonstration, verbal cues required, and tactile cues required  HOME EXERCISE PROGRAM:  To be given at next visit.   GOALS: Goals reviewed with patient? Yes  SHORT TERM GOALS: Target date: 03/06/2023  Patient will be independent in home exercise program to improve strength/mobility for better functional independence with ADLs.  Baseline: 10/1: give next session 11/5: HEP compliant Goal status: MET   LONG TERM GOALS: Target date:07/24/2023    Patient will increase FOTO score to equal to or greater than 48 to demonstrate statistically significant improvement in mobility and quality of life  Baseline: 10/1: 34%  11/5: 39.8% 12/12: 42%   Goal status: partially met   2.  Patient (> 63 years old) will complete five times sit to stand test in < 15 seconds indicating an increased LE strength and improved balance.  Baseline: 10/1; unable to tolerate two consecutive sit to stands 11/5: 2.19.26 5 partial sit to stands 12/12: 37.68s;One full sit to stand bring UE to walker, four partial STS without UE to walker  Goal status: Partially Met  3.  Patient will increase 10 meter walk test to >0.5 m/s as to improve gait speed for better community ambulation and to reduce fall risk.  Baseline: 10/1: not tested this session 11/5: unable to stand from wheelchair fully to start walking. 12/12: not appropriate at this time 12/24: takes 3 steps in previous sessions  Goal status: Ongoing  4.  Patient will transfer independently at home and no longer require use of [employer] for increased  independence.  Baseline: 10/1: reliant upon lift 11/5: reliant upon lift still 12/12: reliant upon lift still  Goal status: partially met    ASSESSMENT:  CLINICAL IMPRESSION: Patient is able to tolerate standing and weight shifting this session. She is  fatigued by last stand in // bars and unable to let go of Ue's due to limited ability to sense COM (forward trunk lean). She is highly motivated and able to activate LE's this session with improved fluidity.  Pt will continue to benefit from skilled therapy to address remaining deficits in order to improve overall QoL and return to PLOF.   OBJECTIVE IMPAIRMENTS: Abnormal gait, decreased activity tolerance, decreased balance, decreased coordination, decreased endurance, decreased mobility, difficulty walking, decreased strength, impaired perceived functional ability, improper body mechanics, and postural dysfunction.   ACTIVITY LIMITATIONS: carrying, lifting, bending, standing, squatting, sleeping, transfers, bed mobility, bathing, toileting, dressing, reach over head, hygiene/grooming, locomotion level, and caring for others  PARTICIPATION LIMITATIONS: meal prep, cleaning, laundry, driving, shopping, community activity, yard work, and church  PERSONAL FACTORS: back pain, CBP, chronic L shoulder pain, galactorrhea, neuropathy, HPV, hypercholestermeia, HTN, MS, osteopenia, PONV, wrist fracture are also affecting patient's functional outcome.   REHAB POTENTIAL: Good  CLINICAL DECISION MAKING: Evolving/moderate complexity  EVALUATION COMPLEXITY: Moderate  PLAN:  PT FREQUENCY: 2x/week  PT DURATION: 12 weeks  PLANNED INTERVENTIONS: Therapeutic exercises, Therapeutic activity, Neuromuscular re-education, Balance training, Gait training, Patient/Family education, Self Care, Joint mobilization, Stair training, Vestibular training, Canalith repositioning, Orthotic/Fit training, DME instructions, Dry Needling, Electrical stimulation, Spinal mobilization, Cryotherapy, Moist heat, Compression bandaging, scar mobilization, Taping, Traction, Ultrasound, Manual therapy, and Re-evaluation  PLAN FOR NEXT SESSION:  Gait training, LE strengthening, HEP as appropriate   11:00 AM, 05/08/23 Mari Battaglia  Leopoldo, PT,  DPT Physical Therapist - [facility]  Outpatient Physical Therapy- Main Campus [phone]

## 2023-05-08 ENCOUNTER — Ambulatory Visit: Payer: PPO

## 2023-05-08 DIAGNOSIS — R262 Difficulty in walking, not elsewhere classified: Secondary | ICD-10-CM

## 2023-05-08 DIAGNOSIS — M6281 Muscle weakness (generalized): Secondary | ICD-10-CM

## 2023-05-08 DIAGNOSIS — R2681 Unsteadiness on feet: Secondary | ICD-10-CM

## 2023-05-08 DIAGNOSIS — R278 Other lack of coordination: Secondary | ICD-10-CM

## 2023-05-08 NOTE — Therapy (Signed)
 OUTPATIENT PHYSICAL THERAPY NEURO TREATMENT    Patient Name: Lisa Williams MRN: [id number] DOB:1960-12-09, 62 y.o., female Today's Date: 05/10/2023   PCP: Lenon Layman ORN MD REFERRING PROVIDER: Lenon Layman ORN MD  END OF SESSION:  PT End of Session - 05/10/23 1108     Visit Number 26    Number of Visits 47    Date for PT Re-Evaluation 07/24/23    PT Start Time 1015    PT Stop Time 1059    PT Time Calculation (min) 44 min    Equipment Utilized During Treatment Gait belt    Activity Tolerance Patient tolerated treatment well    Behavior During Therapy [facility] for tasks assessed/performed                       Past Medical History:  Diagnosis Date   Abdominal pain, right upper quadrant    Back pain    Calculus of kidney 12/09/2013   Chronic back pain    unspecified   Chronic left shoulder pain 07/19/2015   Complication of anesthesia    Functional disorder of bladder    other   Galactorrhea 11/26/2014   Chronic    Hereditary and idiopathic neuropathy 08/19/2013   History of kidney stones    HPV test positive    Hypercholesteremia 08/19/2013   Hypertension    Incomplete bladder emptying    Microscopic hematuria    MS (multiple sclerosis) (HCC)    Muscle spasticity 05/21/2014   Nonspecific findings on examination of urine    other   Osteopenia    PONV (postoperative nausea and vomiting)    Status post laparoscopic supracervical hysterectomy 11/26/2014   Tobacco user 11/26/2014   Wrist fracture    Past Surgical History:  Procedure Laterality Date   bilateral tubal ligation  1996   BREAST CYST EXCISION Left 2002   CYST EXCISION Left 05/10/2022   Procedure: CYST REMOVAL;  Surgeon: Rodolph Romano, MD;  Location: [facility] ORS;  Service: General;  Laterality: Left;   FRACTURE SURGERY     KNEE SURGERY     right   LAPAROSCOPIC SUPRACERVICAL HYSTERECTOMY  08/05/2013   ORIF WRIST FRACTURE Left 01/17/2017   Procedure: OPEN REDUCTION  INTERNAL FIXATION (ORIF) WRIST FRACTURE;  Surgeon: Leora Lynwood SAUNDERS, MD;  Location: [facility] ORS;  Service: Orthopedics;  Laterality: Left;   RADIOLOGY WITH ANESTHESIA N/A 03/18/2020   Procedure: MRI WITH ANESTHESIA CERVICAL SPINE AND BRAIN  WITH AND WITHOUT CONTRAST;  Surgeon: Radiologist, Medication, MD;  Location: [facility] OR;  Service: Radiology;  Laterality: N/A;   TUBAL LIGATION Bilateral    VAGINAL HYSTERECTOMY  03/2006   Patient Active Problem List   Diagnosis Date Noted   Hypokalemia 07/29/2022   Weakness of both lower extremities 07/29/2022   Abscess of left groin 11/15/2021   Abnormal LFTs 11/15/2021   Acute respiratory disease due to COVID-19 virus 11/21/2020   Weakness    Hypoalbuminemia due to protein-calorie malnutrition [facility])    Neurogenic bowel    Neurogenic bladder    Labile blood pressure    Neuropathic pain    Abscess of female pelvis    SVT (supraventricular tachycardia) (HCC)    Radial styloid tenosynovitis 03/12/2018   Wheelchair dependence 02/27/2018   Localized osteoporosis with current pathological fracture with routine healing 01/19/2017   Wrist fracture 01/16/2017   Sprain of ankle 03/23/2016   Closed fracture of lateral malleolus 03/16/2016   Health care maintenance 01/24/2016   Blood pressure elevated  without history of HTN 10/25/2015   Essential hypertension 10/25/2015   Multiple sclerosis (HCC) 10/02/2015   Chronic left shoulder pain 07/19/2015   Multiple sclerosis exacerbation (HCC) 07/14/2015   MS (multiple sclerosis) (HCC) 11/26/2014   Increased body mass index 11/26/2014   HPV test positive 11/26/2014   Status post laparoscopic supracervical hysterectomy 11/26/2014   Galactorrhea 11/26/2014   Back ache 05/21/2014   Adiposity 05/21/2014   Disordered sleep 05/21/2014   Muscle spasticity 05/21/2014   Spasticity 05/21/2014   Calculus of kidney 12/09/2013   Renal colic 12/09/2013   Hypercholesteremia 08/19/2013   Hereditary and idiopathic neuropathy  08/19/2013   Hypercholesterolemia without hypertriglyceridemia 08/19/2013   Bladder infection, chronic 07/25/2012   Disorder of bladder function 07/25/2012   Incomplete bladder emptying 07/25/2012   Microscopic hematuria 07/25/2012   Right upper quadrant pain 07/25/2012    ONSET DATE: 1995  REFERRING DIAG: MS, weakness, other closed fracture of proximal end of R tibia  THERAPY DIAG:  Muscle weakness (generalized)  Difficulty in walking, not elsewhere classified  Unsteadiness on feet  Other lack of coordination  Rationale for Evaluation and Treatment: Rehabilitation  SUBJECTIVE:                                                                                                                                                                                             SUBJECTIVE STATEMENT: Patient reports it is cold outside but she feels motivated.    Pt accompanied by: self  PERTINENT HISTORY: Patient is returning to PT s/p ORIF of R tibia shaft fracture 09/23/2022. Patient has weakness in BLE with RLE>LLE. She drives with hand controls. Patient has been diagnosed with MS in 1995. PMH includes: back pain, CBP, chronic L shoulder pain, galactorrhea, neuropathy, HPV, hypercholestermeia, HTN, MS, osteopenia, PONV, wrist fracture. Additional order for other closed fracture of proximal end of R tibia with routine healing. Still has to use [employer].   PAIN:  Are you having pain? No  PRECAUTIONS: Fall  RED FLAGS: None   WEIGHT BEARING RESTRICTIONS: No  FALLS: Has patient fallen in last 6 months? Yes. Number of falls 1  LIVING ENVIRONMENT: Lives with: lives with their family Lives in: House/apartment Stairs:  ramp from garage into house Has following equipment at home: Vannie - 2 wheeled, shower chair, Grab bars, and Ramped entry  PLOF: Independent with household mobility with device  PATIENT GOALS: get up and pivot and turn, sit to stands. Return to walk, go to bathroom on  her own   OBJECTIVE:  Note: Objective measures were completed at Evaluation unless otherwise noted.  DIAGNOSTIC FINDINGS: MRI of  the brain 03/18/2020 showed multiple T2/FLAIR hyperintense foci in the periventricular, juxtacortical and deep white matter.  There were no infratentorial lesions noted.  None of the foci enhanced.   Due to severe claustrophobia, the study was done with conscious sedation in the hospital   SENSATION: Lack of sensation in feet, loss in bilateral lateral aspect of knee  LOWER EXTREMITY MMT:    MMT Right Eval Left Eval  Hip flexion 2- 2  Hip extension    Hip abduction 3+ 3+  Hip adduction 3 3  Hip internal rotation    Hip external rotation    Knee flexion 2 2+  Knee extension 2- 2  Ankle dorsiflexion 0 0  Ankle plantarflexion 0 0  Ankle inversion    Ankle eversion    (Blank rows = not tested)  TRANSFERS: Assistive device utilized: Environmental Consultant - 2 wheeled  Sit to stand: CGA Stand to sit: CGA  FUNCTIONAL TESTS:  5 times sit to stand: unable to complete 2 consecutive sit to stands  10 meter walk test: perform in future session   PATIENT SURVEYS:  FOTO 34  TODAY'S TREATMENT: DATE: 05/10/23 All activities performed with gait belt and CGA unless otherwise noted.  Sit to stand with extra cushion in seat with BrW in front:  -multiple attempts, one full stand 28 seconds, multiple partial stands  Seated: Contract relax into PT leg 10x each LE RTB abduction 10x each LE; 2 sets RTB adduction 10 x each LE  Green swiss ball: -TrA press 10x -TrA press with UE raises 10x -chest press to overhead raise 10x -overhead Y 10x -rotation 10x each side     Pt requires significant therapeutic rest breaks throughout the session due to fatigue.  PATIENT EDUCATION: Education details: Pt educated throughout session about proper posture and technique with exercises. Improved exercise technique, movement at target joints, use of target muscles after min to mod  verbal, visual, tactile cues.  Person educated: Patient Education method: Explanation, Demonstration, Tactile cues, and Verbal cues Education comprehension: verbalized understanding, returned demonstration, verbal cues required, and tactile cues required  HOME EXERCISE PROGRAM:  To be given at next visit.   GOALS: Goals reviewed with patient? Yes  SHORT TERM GOALS: Target date: 03/06/2023  Patient will be independent in home exercise program to improve strength/mobility for better functional independence with ADLs.  Baseline: 10/1: give next session 11/5: HEP compliant Goal status: MET   LONG TERM GOALS: Target date:07/24/2023    Patient will increase FOTO score to equal to or greater than 48 to demonstrate statistically significant improvement in mobility and quality of life  Baseline: 10/1: 34%  11/5: 39.8% 12/12: 42%   Goal status: partially met   2.  Patient (> 92 years old) will complete five times sit to stand test in < 15 seconds indicating an increased LE strength and improved balance.  Baseline: 10/1; unable to tolerate two consecutive sit to stands 11/5: 2.19.26 5 partial sit to stands 12/12: 37.68s;One full sit to stand bring UE to walker, four partial STS without UE to walker  Goal status: Partially Met  3.  Patient will increase 10 meter walk test to >0.5 m/s as to improve gait speed for better community ambulation and to reduce fall risk.  Baseline: 10/1: not tested this session 11/5: unable to stand from wheelchair fully to start walking. 12/12: not appropriate at this time 12/24: takes 3 steps in previous sessions  Goal status: Ongoing  4.  Patient will transfer independently at  home and no longer require use of Sara Plus Lift for increased independence.  Baseline: 10/1: reliant upon lift 11/5: reliant upon lift still 12/12: reliant upon lift still  Goal status: partially met    ASSESSMENT:  CLINICAL IMPRESSION: Patient is highly motivated. She is able to  perform multiple attempts to sit to stand with one full successful stand for 28 seconds with decreasing UE support. She is fatigued by end of session with noticeable leg trembling.  Pt will continue to benefit from skilled therapy to address remaining deficits in order to improve overall QoL and return to PLOF.   OBJECTIVE IMPAIRMENTS: Abnormal gait, decreased activity tolerance, decreased balance, decreased coordination, decreased endurance, decreased mobility, difficulty walking, decreased strength, impaired perceived functional ability, improper body mechanics, and postural dysfunction.   ACTIVITY LIMITATIONS: carrying, lifting, bending, standing, squatting, sleeping, transfers, bed mobility, bathing, toileting, dressing, reach over head, hygiene/grooming, locomotion level, and caring for others  PARTICIPATION LIMITATIONS: meal prep, cleaning, laundry, driving, shopping, community activity, yard work, and church  PERSONAL FACTORS: back pain, CBP, chronic L shoulder pain, galactorrhea, neuropathy, HPV, hypercholestermeia, HTN, MS, osteopenia, PONV, wrist fracture are also affecting patient's functional outcome.   REHAB POTENTIAL: Good  CLINICAL DECISION MAKING: Evolving/moderate complexity  EVALUATION COMPLEXITY: Moderate  PLAN:  PT FREQUENCY: 2x/week  PT DURATION: 12 weeks  PLANNED INTERVENTIONS: Therapeutic exercises, Therapeutic activity, Neuromuscular re-education, Balance training, Gait training, Patient/Family education, Self Care, Joint mobilization, Stair training, Vestibular training, Canalith repositioning, Orthotic/Fit training, DME instructions, Dry Needling, Electrical stimulation, Spinal mobilization, Cryotherapy, Moist heat, Compression bandaging, scar mobilization, Taping, Traction, Ultrasound, Manual therapy, and Re-evaluation  PLAN FOR NEXT SESSION:  Gait training, LE strengthening, HEP as appropriate   11:14 AM, 05/10/23 Mandy Peeks  Leopoldo, PT, DPT Physical Therapist -  [facility]  Outpatient Physical Therapy- Main Campus [phone]

## 2023-05-10 ENCOUNTER — Ambulatory Visit: Payer: PPO | Attending: Neurology

## 2023-05-10 DIAGNOSIS — R278 Other lack of coordination: Secondary | ICD-10-CM

## 2023-05-10 DIAGNOSIS — R2689 Other abnormalities of gait and mobility: Secondary | ICD-10-CM | POA: Insufficient documentation

## 2023-05-10 DIAGNOSIS — R262 Difficulty in walking, not elsewhere classified: Secondary | ICD-10-CM

## 2023-05-10 DIAGNOSIS — M6281 Muscle weakness (generalized): Secondary | ICD-10-CM

## 2023-05-10 DIAGNOSIS — R2681 Unsteadiness on feet: Secondary | ICD-10-CM | POA: Diagnosis not present

## 2023-05-14 NOTE — Therapy (Signed)
 OUTPATIENT PHYSICAL THERAPY NEURO TREATMENT    Patient Name: Lisa Williams MRN: [id number] DOB:10/24/1960, 63 y.o., female Today's Date: 05/15/2023   PCP: Lenon Layman ORN MD REFERRING PROVIDER: Lenon Layman ORN MD  END OF SESSION:  PT End of Session - 05/15/23 1009     Visit Number 27    Number of Visits 47    Date for PT Re-Evaluation 07/24/23    PT Start Time 1013    PT Stop Time 1059    PT Time Calculation (min) 46 min    Equipment Utilized During Treatment Gait belt    Activity Tolerance Patient tolerated treatment well    Behavior During Therapy [facility] for tasks assessed/performed                        Past Medical History:  Diagnosis Date   Abdominal pain, right upper quadrant    Back pain    Calculus of kidney 12/09/2013   Chronic back pain    unspecified   Chronic left shoulder pain 07/19/2015   Complication of anesthesia    Functional disorder of bladder    other   Galactorrhea 11/26/2014   Chronic    Hereditary and idiopathic neuropathy 08/19/2013   History of kidney stones    HPV test positive    Hypercholesteremia 08/19/2013   Hypertension    Incomplete bladder emptying    Microscopic hematuria    MS (multiple sclerosis) (HCC)    Muscle spasticity 05/21/2014   Nonspecific findings on examination of urine    other   Osteopenia    PONV (postoperative nausea and vomiting)    Status post laparoscopic supracervical hysterectomy 11/26/2014   Tobacco user 11/26/2014   Wrist fracture    Past Surgical History:  Procedure Laterality Date   bilateral tubal ligation  1996   BREAST CYST EXCISION Left 2002   CYST EXCISION Left 05/10/2022   Procedure: CYST REMOVAL;  Surgeon: Rodolph Romano, MD;  Location: [facility] ORS;  Service: General;  Laterality: Left;   FRACTURE SURGERY     KNEE SURGERY     right   LAPAROSCOPIC SUPRACERVICAL HYSTERECTOMY  08/05/2013   ORIF WRIST FRACTURE Left 01/17/2017   Procedure: OPEN REDUCTION  INTERNAL FIXATION (ORIF) WRIST FRACTURE;  Surgeon: Leora Lynwood SAUNDERS, MD;  Location: [facility] ORS;  Service: Orthopedics;  Laterality: Left;   RADIOLOGY WITH ANESTHESIA N/A 03/18/2020   Procedure: MRI WITH ANESTHESIA CERVICAL SPINE AND BRAIN  WITH AND WITHOUT CONTRAST;  Surgeon: Radiologist, Medication, MD;  Location: [facility] OR;  Service: Radiology;  Laterality: N/A;   TUBAL LIGATION Bilateral    VAGINAL HYSTERECTOMY  03/2006   Patient Active Problem List   Diagnosis Date Noted   Hypokalemia 07/29/2022   Weakness of both lower extremities 07/29/2022   Abscess of left groin 11/15/2021   Abnormal LFTs 11/15/2021   Acute respiratory disease due to COVID-19 virus 11/21/2020   Weakness    Hypoalbuminemia due to protein-calorie malnutrition [facility])    Neurogenic bowel    Neurogenic bladder    Labile blood pressure    Neuropathic pain    Abscess of female pelvis    SVT (supraventricular tachycardia) (HCC)    Radial styloid tenosynovitis 03/12/2018   Wheelchair dependence 02/27/2018   Localized osteoporosis with current pathological fracture with routine healing 01/19/2017   Wrist fracture 01/16/2017   Sprain of ankle 03/23/2016   Closed fracture of lateral malleolus 03/16/2016   Health care maintenance 01/24/2016   Blood pressure  elevated without history of HTN 10/25/2015   Essential hypertension 10/25/2015   Multiple sclerosis (HCC) 10/02/2015   Chronic left shoulder pain 07/19/2015   Multiple sclerosis exacerbation (HCC) 07/14/2015   MS (multiple sclerosis) (HCC) 11/26/2014   Increased body mass index 11/26/2014   HPV test positive 11/26/2014   Status post laparoscopic supracervical hysterectomy 11/26/2014   Galactorrhea 11/26/2014   Back ache 05/21/2014   Adiposity 05/21/2014   Disordered sleep 05/21/2014   Muscle spasticity 05/21/2014   Spasticity 05/21/2014   Calculus of kidney 12/09/2013   Renal colic 12/09/2013   Hypercholesteremia 08/19/2013   Hereditary and idiopathic neuropathy  08/19/2013   Hypercholesterolemia without hypertriglyceridemia 08/19/2013   Bladder infection, chronic 07/25/2012   Disorder of bladder function 07/25/2012   Incomplete bladder emptying 07/25/2012   Microscopic hematuria 07/25/2012   Right upper quadrant pain 07/25/2012    ONSET DATE: 1995  REFERRING DIAG: MS, weakness, other closed fracture of proximal end of R tibia  THERAPY DIAG:  Muscle weakness (generalized)  Difficulty in walking, not elsewhere classified  Unsteadiness on feet  Rationale for Evaluation and Treatment: Rehabilitation  SUBJECTIVE:                                                                                                                                                                                             SUBJECTIVE STATEMENT: Patient reports she is doing well despite the cold.    Pt accompanied by: self  PERTINENT HISTORY: Patient is returning to PT s/p ORIF of R tibia shaft fracture 09/23/2022. Patient has weakness in BLE with RLE>LLE. She drives with hand controls. Patient has been diagnosed with MS in 1995. PMH includes: back pain, CBP, chronic L shoulder pain, galactorrhea, neuropathy, HPV, hypercholestermeia, HTN, MS, osteopenia, PONV, wrist fracture. Additional order for other closed fracture of proximal end of R tibia with routine healing. Still has to use [employer].   PAIN:  Are you having pain? No  PRECAUTIONS: Fall  RED FLAGS: None   WEIGHT BEARING RESTRICTIONS: No  FALLS: Has patient fallen in last 6 months? Yes. Number of falls 1  LIVING ENVIRONMENT: Lives with: lives with their family Lives in: House/apartment Stairs:  ramp from garage into house Has following equipment at home: Vannie - 2 wheeled, shower chair, Grab bars, and Ramped entry  PLOF: Independent with household mobility with device  PATIENT GOALS: get up and pivot and turn, sit to stands. Return to walk, go to bathroom on her own   OBJECTIVE:  Note:  Objective measures were completed at Evaluation unless otherwise noted.  DIAGNOSTIC FINDINGS: MRI of the brain 03/18/2020 showed multiple  T2/FLAIR hyperintense foci in the periventricular, juxtacortical and deep white matter.  There were no infratentorial lesions noted.  None of the foci enhanced.   Due to severe claustrophobia, the study was done with conscious sedation in the hospital   SENSATION: Lack of sensation in feet, loss in bilateral lateral aspect of knee  LOWER EXTREMITY MMT:    MMT Right Eval Left Eval  Hip flexion 2- 2  Hip extension    Hip abduction 3+ 3+  Hip adduction 3 3  Hip internal rotation    Hip external rotation    Knee flexion 2 2+  Knee extension 2- 2  Ankle dorsiflexion 0 0  Ankle plantarflexion 0 0  Ankle inversion    Ankle eversion    (Blank rows = not tested)  TRANSFERS: Assistive device utilized: Environmental Consultant - 2 wheeled  Sit to stand: CGA Stand to sit: CGA  FUNCTIONAL TESTS:  5 times sit to stand: unable to complete 2 consecutive sit to stands  10 meter walk test: perform in future session   PATIENT SURVEYS:  FOTO 34  TODAY'S TREATMENT: DATE: 05/15/23 All activities performed with gait belt and CGA unless otherwise noted.  In // bars:  Sit to stand: -first attempt: attempt at ambulation, able to take small step forward with l and R with blocking of R knee -second attempt: L foot forward 3x; blocking of R knee -third attempt: R foot forward 3x -fourth attempt: static stand with decreasing UE support to SUE support x multiple trials  Seated: Slider: knee flexion/extension 10x each LE RTB abduction 10x each LE; 2 sets RTB adduction 10 x each LE   Seated boxing: cues for sequencing, body mechanics, and pace/rhythm for functional contraction and timing of muscle recruitment: -Cross body punches to mitts on PT hands with no back support for focused core stabilization with crossing midline 2x 60 seconds  -Cross body punch with secondary  combination of elbow for full cross body rotation to PT mitt for trunk stability, coordination, and cardiovascular challenge x 60 seconds -Upper cut to mitts in PT hands for core activation without back support with perturbations 60 seconds     Pt requires significant therapeutic rest breaks throughout the session due to fatigue.  PATIENT EDUCATION: Education details: Pt educated throughout session about proper posture and technique with exercises. Improved exercise technique, movement at target joints, use of target muscles after min to mod verbal, visual, tactile cues.  Person educated: Patient Education method: Explanation, Demonstration, Tactile cues, and Verbal cues Education comprehension: verbalized understanding, returned demonstration, verbal cues required, and tactile cues required  HOME EXERCISE PROGRAM:  To be given at next visit.   GOALS: Goals reviewed with patient? Yes  SHORT TERM GOALS: Target date: 03/06/2023  Patient will be independent in home exercise program to improve strength/mobility for better functional independence with ADLs.  Baseline: 10/1: give next session 11/5: HEP compliant Goal status: MET   LONG TERM GOALS: Target date:07/24/2023    Patient will increase FOTO score to equal to or greater than 48 to demonstrate statistically significant improvement in mobility and quality of life  Baseline: 10/1: 34%  11/5: 39.8% 12/12: 42%   Goal status: partially met   2.  Patient (> 35 years old) will complete five times sit to stand test in < 15 seconds indicating an increased LE strength and improved balance.  Baseline: 10/1; unable to tolerate two consecutive sit to stands 11/5: 2.19.26 5 partial sit to stands 12/12: 37.68s;One full sit to  stand bring UE to walker, four partial STS without UE to walker  Goal status: Partially Met  3.  Patient will increase 10 meter walk test to >0.5 m/s as to improve gait speed for better community ambulation and to  reduce fall risk.  Baseline: 10/1: not tested this session 11/5: unable to stand from wheelchair fully to start walking. 12/12: not appropriate at this time 12/24: takes 3 steps in previous sessions  Goal status: Ongoing  4.  Patient will transfer independently at home and no longer require use of Sara Plus Lift for increased independence.  Baseline: 10/1: reliant upon lift 11/5: reliant upon lift still 12/12: reliant upon lift still  Goal status: partially met    ASSESSMENT:  CLINICAL IMPRESSION: Patient requires blocking of RLE when in standing this session due to limited weight shift onto RLE. She improves with her weight acceptance with cueing and tactile assistance. She additionally requires cueing in standing due to forward trunk lean to correct for alignment for stability.  Pt will continue to benefit from skilled therapy to address remaining deficits in order to improve overall QoL and return to PLOF.   OBJECTIVE IMPAIRMENTS: Abnormal gait, decreased activity tolerance, decreased balance, decreased coordination, decreased endurance, decreased mobility, difficulty walking, decreased strength, impaired perceived functional ability, improper body mechanics, and postural dysfunction.   ACTIVITY LIMITATIONS: carrying, lifting, bending, standing, squatting, sleeping, transfers, bed mobility, bathing, toileting, dressing, reach over head, hygiene/grooming, locomotion level, and caring for others  PARTICIPATION LIMITATIONS: meal prep, cleaning, laundry, driving, shopping, community activity, yard work, and church  PERSONAL FACTORS: back pain, CBP, chronic L shoulder pain, galactorrhea, neuropathy, HPV, hypercholestermeia, HTN, MS, osteopenia, PONV, wrist fracture are also affecting patient's functional outcome.   REHAB POTENTIAL: Good  CLINICAL DECISION MAKING: Evolving/moderate complexity  EVALUATION COMPLEXITY: Moderate  PLAN:  PT FREQUENCY: 2x/week  PT DURATION: 12  weeks  PLANNED INTERVENTIONS: Therapeutic exercises, Therapeutic activity, Neuromuscular re-education, Balance training, Gait training, Patient/Family education, Self Care, Joint mobilization, Stair training, Vestibular training, Canalith repositioning, Orthotic/Fit training, DME instructions, Dry Needling, Electrical stimulation, Spinal mobilization, Cryotherapy, Moist heat, Compression bandaging, scar mobilization, Taping, Traction, Ultrasound, Manual therapy, and Re-evaluation  PLAN FOR NEXT SESSION:  Gait training, LE strengthening, HEP as appropriate   11:59 AM, 05/15/23 Abron Neddo  Leopoldo, PT, DPT Physical Therapist - [facility]  Outpatient Physical Therapy- Main Campus [phone]

## 2023-05-15 ENCOUNTER — Ambulatory Visit: Payer: PPO

## 2023-05-15 DIAGNOSIS — M6281 Muscle weakness (generalized): Secondary | ICD-10-CM

## 2023-05-15 DIAGNOSIS — R2681 Unsteadiness on feet: Secondary | ICD-10-CM

## 2023-05-15 DIAGNOSIS — R262 Difficulty in walking, not elsewhere classified: Secondary | ICD-10-CM

## 2023-05-16 ENCOUNTER — Ambulatory Visit
Admission: RE | Admit: 2023-05-16 | Discharge: 2023-05-16 | Disposition: A | Discharge status: Home / Self Care | Payer: PPO | Source: Ambulatory Visit | Attending: Internal Medicine | Admitting: Internal Medicine

## 2023-05-16 DIAGNOSIS — Z1231 Encounter for screening mammogram for malignant neoplasm of breast: Secondary | ICD-10-CM | POA: Diagnosis not present

## 2023-05-16 NOTE — Therapy (Signed)
 OUTPATIENT PHYSICAL THERAPY NEURO TREATMENT    Patient Name: Lisa Williams MRN: [id number] DOB:07/19/1960, 63 y.o., female Today's Date: 05/17/2023   PCP: Lenon Layman ORN MD REFERRING PROVIDER: Lenon Layman ORN MD  END OF SESSION:  PT End of Session - 05/17/23 1107     Visit Number 28    Number of Visits 47    Date for PT Re-Evaluation 07/24/23    PT Start Time 1015    PT Stop Time 1059    PT Time Calculation (min) 44 min    Equipment Utilized During Treatment Gait belt    Activity Tolerance Patient tolerated treatment well    Behavior During Therapy [facility] for tasks assessed/performed                         Past Medical History:  Diagnosis Date   Abdominal pain, right upper quadrant    Back pain    Calculus of kidney 12/09/2013   Chronic back pain    unspecified   Chronic left shoulder pain 07/19/2015   Complication of anesthesia    Functional disorder of bladder    other   Galactorrhea 11/26/2014   Chronic    Hereditary and idiopathic neuropathy 08/19/2013   History of kidney stones    HPV test positive    Hypercholesteremia 08/19/2013   Hypertension    Incomplete bladder emptying    Microscopic hematuria    MS (multiple sclerosis) (HCC)    Muscle spasticity 05/21/2014   Nonspecific findings on examination of urine    other   Osteopenia    PONV (postoperative nausea and vomiting)    Status post laparoscopic supracervical hysterectomy 11/26/2014   Tobacco user 11/26/2014   Wrist fracture    Past Surgical History:  Procedure Laterality Date   bilateral tubal ligation  1996   BREAST CYST EXCISION Left 2002   CYST EXCISION Left 05/10/2022   Procedure: CYST REMOVAL;  Surgeon: Rodolph Romano, MD;  Location: [facility] ORS;  Service: General;  Laterality: Left;   FRACTURE SURGERY     KNEE SURGERY     right   LAPAROSCOPIC SUPRACERVICAL HYSTERECTOMY  08/05/2013   ORIF WRIST FRACTURE Left 01/17/2017   Procedure: OPEN REDUCTION  INTERNAL FIXATION (ORIF) WRIST FRACTURE;  Surgeon: Leora Lynwood SAUNDERS, MD;  Location: [facility] ORS;  Service: Orthopedics;  Laterality: Left;   RADIOLOGY WITH ANESTHESIA N/A 03/18/2020   Procedure: MRI WITH ANESTHESIA CERVICAL SPINE AND BRAIN  WITH AND WITHOUT CONTRAST;  Surgeon: Radiologist, Medication, MD;  Location: [facility] OR;  Service: Radiology;  Laterality: N/A;   TUBAL LIGATION Bilateral    VAGINAL HYSTERECTOMY  03/2006   Patient Active Problem List   Diagnosis Date Noted   Hypokalemia 07/29/2022   Weakness of both lower extremities 07/29/2022   Abscess of left groin 11/15/2021   Abnormal LFTs 11/15/2021   Acute respiratory disease due to COVID-19 virus 11/21/2020   Weakness    Hypoalbuminemia due to protein-calorie malnutrition [facility])    Neurogenic bowel    Neurogenic bladder    Labile blood pressure    Neuropathic pain    Abscess of female pelvis    SVT (supraventricular tachycardia) (HCC)    Radial styloid tenosynovitis 03/12/2018   Wheelchair dependence 02/27/2018   Localized osteoporosis with current pathological fracture with routine healing 01/19/2017   Wrist fracture 01/16/2017   Sprain of ankle 03/23/2016   Closed fracture of lateral malleolus 03/16/2016   Health care maintenance 01/24/2016   Blood  pressure elevated without history of HTN 10/25/2015   Essential hypertension 10/25/2015   Multiple sclerosis (HCC) 10/02/2015   Chronic left shoulder pain 07/19/2015   Multiple sclerosis exacerbation (HCC) 07/14/2015   MS (multiple sclerosis) (HCC) 11/26/2014   Increased body mass index 11/26/2014   HPV test positive 11/26/2014   Status post laparoscopic supracervical hysterectomy 11/26/2014   Galactorrhea 11/26/2014   Back ache 05/21/2014   Adiposity 05/21/2014   Disordered sleep 05/21/2014   Muscle spasticity 05/21/2014   Spasticity 05/21/2014   Calculus of kidney 12/09/2013   Renal colic 12/09/2013   Hypercholesteremia 08/19/2013   Hereditary and idiopathic neuropathy  08/19/2013   Hypercholesterolemia without hypertriglyceridemia 08/19/2013   Bladder infection, chronic 07/25/2012   Disorder of bladder function 07/25/2012   Incomplete bladder emptying 07/25/2012   Microscopic hematuria 07/25/2012   Right upper quadrant pain 07/25/2012    ONSET DATE: 1995  REFERRING DIAG: MS, weakness, other closed fracture of proximal end of R tibia  THERAPY DIAG:  Muscle weakness (generalized)  Difficulty in walking, not elsewhere classified  Unsteadiness on feet  Other lack of coordination  Rationale for Evaluation and Treatment: Rehabilitation  SUBJECTIVE:                                                                                                                                                                                             SUBJECTIVE STATEMENT: Patient presents with good motivation to PT session.    Pt accompanied by: self  PERTINENT HISTORY: Patient is returning to PT s/p ORIF of R tibia shaft fracture 09/23/2022. Patient has weakness in BLE with RLE>LLE. She drives with hand controls. Patient has been diagnosed with MS in 1995. PMH includes: back pain, CBP, chronic L shoulder pain, galactorrhea, neuropathy, HPV, hypercholestermeia, HTN, MS, osteopenia, PONV, wrist fracture. Additional order for other closed fracture of proximal end of R tibia with routine healing. Still has to use [employer].   PAIN:  Are you having pain? No  PRECAUTIONS: Fall  RED FLAGS: None   WEIGHT BEARING RESTRICTIONS: No  FALLS: Has patient fallen in last 6 months? Yes. Number of falls 1  LIVING ENVIRONMENT: Lives with: lives with their family Lives in: House/apartment Stairs:  ramp from garage into house Has following equipment at home: Vannie - 2 wheeled, shower chair, Grab bars, and Ramped entry  PLOF: Independent with household mobility with device  PATIENT GOALS: get up and pivot and turn, sit to stands. Return to walk, go to bathroom on her own    OBJECTIVE:  Note: Objective measures were completed at Evaluation unless otherwise noted.  DIAGNOSTIC FINDINGS: MRI of  the brain 03/18/2020 showed multiple T2/FLAIR hyperintense foci in the periventricular, juxtacortical and deep white matter.  There were no infratentorial lesions noted.  None of the foci enhanced.   Due to severe claustrophobia, the study was done with conscious sedation in the hospital   SENSATION: Lack of sensation in feet, loss in bilateral lateral aspect of knee  LOWER EXTREMITY MMT:    MMT Right Eval Left Eval  Hip flexion 2- 2  Hip extension    Hip abduction 3+ 3+  Hip adduction 3 3  Hip internal rotation    Hip external rotation    Knee flexion 2 2+  Knee extension 2- 2  Ankle dorsiflexion 0 0  Ankle plantarflexion 0 0  Ankle inversion    Ankle eversion    (Blank rows = not tested)  TRANSFERS: Assistive device utilized: Environmental Consultant - 2 wheeled  Sit to stand: CGA Stand to sit: CGA  FUNCTIONAL TESTS:  5 times sit to stand: unable to complete 2 consecutive sit to stands  10 meter walk test: perform in future session   PATIENT SURVEYS:  FOTO 34  TODAY'S TREATMENT: DATE: 05/17/23 All activities performed with gait belt and CGA unless otherwise noted.   Sit to stands from w/c with BrW with cushion in seat. X multiple attempts with two near full stands.    Activity Description: hit pods lit up, second, third and fourth round play against student Activity Setting:  random Number of Pods:  6 Cycles/Sets:  4 Duration (Time or Hit Count):  30 seconds   Seated: Slider: knee flexion/extension 10x each LE RTB abduction 10x each LE; 2 sets RTB adduction 10 x each LE  Dynadisc hamstring isometric 12x each LE     Pt requires significant therapeutic rest breaks throughout the session due to fatigue.  PATIENT EDUCATION: Education details: Pt educated throughout session about proper posture and technique with exercises. Improved exercise technique,  movement at target joints, use of target muscles after min to mod verbal, visual, tactile cues.  Person educated: Patient Education method: Explanation, Demonstration, Tactile cues, and Verbal cues Education comprehension: verbalized understanding, returned demonstration, verbal cues required, and tactile cues required  HOME EXERCISE PROGRAM:  To be given at next visit.   GOALS: Goals reviewed with patient? Yes  SHORT TERM GOALS: Target date: 03/06/2023  Patient will be independent in home exercise program to improve strength/mobility for better functional independence with ADLs.  Baseline: 10/1: give next session 11/5: HEP compliant Goal status: MET   LONG TERM GOALS: Target date:07/24/2023    Patient will increase FOTO score to equal to or greater than 48 to demonstrate statistically significant improvement in mobility and quality of life  Baseline: 10/1: 34%  11/5: 39.8% 12/12: 42%   Goal status: partially met   2.  Patient (> 35 years old) will complete five times sit to stand test in < 15 seconds indicating an increased LE strength and improved balance.  Baseline: 10/1; unable to tolerate two consecutive sit to stands 11/5: 2.19.26 5 partial sit to stands 12/12: 37.68s;One full sit to stand bring UE to walker, four partial STS without UE to walker  Goal status: Partially Met  3.  Patient will increase 10 meter walk test to >0.5 m/s as to improve gait speed for better community ambulation and to reduce fall risk.  Baseline: 10/1: not tested this session 11/5: unable to stand from wheelchair fully to start walking. 12/12: not appropriate at this time 12/24: takes 3 steps in  previous sessions  Goal status: Ongoing  4.  Patient will transfer independently at home and no longer require use of Sara Plus Lift for increased independence.  Baseline: 10/1: reliant upon lift 11/5: reliant upon lift still 12/12: reliant upon lift still  Goal status: partially  met    ASSESSMENT:  CLINICAL IMPRESSION: Patient tolerates multiple sit to stand techniques with focus on utilization of LE's. She is challenged with final transition from one hand on walker and one on chair to both on chairs. She is able to get partially twice up but unable to obtain full positioning. Core stabilization and LE strengthening tolerated well.   Pt will continue to benefit from skilled therapy to address remaining deficits in order to improve overall QoL and return to PLOF.   OBJECTIVE IMPAIRMENTS: Abnormal gait, decreased activity tolerance, decreased balance, decreased coordination, decreased endurance, decreased mobility, difficulty walking, decreased strength, impaired perceived functional ability, improper body mechanics, and postural dysfunction.   ACTIVITY LIMITATIONS: carrying, lifting, bending, standing, squatting, sleeping, transfers, bed mobility, bathing, toileting, dressing, reach over head, hygiene/grooming, locomotion level, and caring for others  PARTICIPATION LIMITATIONS: meal prep, cleaning, laundry, driving, shopping, community activity, yard work, and church  PERSONAL FACTORS: back pain, CBP, chronic L shoulder pain, galactorrhea, neuropathy, HPV, hypercholestermeia, HTN, MS, osteopenia, PONV, wrist fracture are also affecting patient's functional outcome.   REHAB POTENTIAL: Good  CLINICAL DECISION MAKING: Evolving/moderate complexity  EVALUATION COMPLEXITY: Moderate  PLAN:  PT FREQUENCY: 2x/week  PT DURATION: 12 weeks  PLANNED INTERVENTIONS: Therapeutic exercises, Therapeutic activity, Neuromuscular re-education, Balance training, Gait training, Patient/Family education, Self Care, Joint mobilization, Stair training, Vestibular training, Canalith repositioning, Orthotic/Fit training, DME instructions, Dry Needling, Electrical stimulation, Spinal mobilization, Cryotherapy, Moist heat, Compression bandaging, scar mobilization, Taping, Traction,  Ultrasound, Manual therapy, and Re-evaluation  PLAN FOR NEXT SESSION:  Gait training, LE strengthening, HEP as appropriate   11:12 AM, 05/17/23 Thayne Cindric  Leopoldo, PT, DPT Physical Therapist - [facility]  Outpatient Physical Therapy- Main Campus [phone]

## 2023-05-17 ENCOUNTER — Ambulatory Visit: Payer: PPO

## 2023-05-17 DIAGNOSIS — M6281 Muscle weakness (generalized): Secondary | ICD-10-CM | POA: Diagnosis not present

## 2023-05-17 DIAGNOSIS — R262 Difficulty in walking, not elsewhere classified: Secondary | ICD-10-CM

## 2023-05-17 DIAGNOSIS — R2681 Unsteadiness on feet: Secondary | ICD-10-CM

## 2023-05-17 DIAGNOSIS — R278 Other lack of coordination: Secondary | ICD-10-CM

## 2023-05-21 DIAGNOSIS — Z87891 Personal history of nicotine dependence: Secondary | ICD-10-CM | POA: Diagnosis not present

## 2023-05-21 DIAGNOSIS — W1839XD Other fall on same level, subsequent encounter: Secondary | ICD-10-CM | POA: Diagnosis not present

## 2023-05-21 DIAGNOSIS — W050XXD Fall from non-moving wheelchair, subsequent encounter: Secondary | ICD-10-CM | POA: Diagnosis not present

## 2023-05-21 DIAGNOSIS — R600 Localized edema: Secondary | ICD-10-CM | POA: Diagnosis not present

## 2023-05-21 DIAGNOSIS — M1711 Unilateral primary osteoarthritis, right knee: Secondary | ICD-10-CM | POA: Diagnosis not present

## 2023-05-21 DIAGNOSIS — S82191D Other fracture of upper end of right tibia, subsequent encounter for closed fracture with routine healing: Secondary | ICD-10-CM | POA: Diagnosis not present

## 2023-05-22 ENCOUNTER — Ambulatory Visit: Payer: PPO

## 2023-05-22 DIAGNOSIS — M6281 Muscle weakness (generalized): Secondary | ICD-10-CM

## 2023-05-22 DIAGNOSIS — R2681 Unsteadiness on feet: Secondary | ICD-10-CM

## 2023-05-22 DIAGNOSIS — R262 Difficulty in walking, not elsewhere classified: Secondary | ICD-10-CM

## 2023-05-22 NOTE — Therapy (Signed)
 OUTPATIENT PHYSICAL THERAPY NEURO TREATMENT    Patient Name: Lisa Williams MRN: [id number] DOB:20-Apr-1961, 63 y.o., female Today's Date: 05/22/2023   PCP: Lenon Layman ORN MD REFERRING PROVIDER: Lenon Layman ORN MD  END OF SESSION:  PT End of Session - 05/22/23 1056     Visit Number 29    Number of Visits 47    Date for PT Re-Evaluation 07/24/23    PT Start Time 1015    PT Stop Time 1059    PT Time Calculation (min) 44 min    Equipment Utilized During Treatment Gait belt    Activity Tolerance Patient tolerated treatment well    Behavior During Therapy [facility] for tasks assessed/performed                          Past Medical History:  Diagnosis Date   Abdominal pain, right upper quadrant    Back pain    Calculus of kidney 12/09/2013   Chronic back pain    unspecified   Chronic left shoulder pain 07/19/2015   Complication of anesthesia    Functional disorder of bladder    other   Galactorrhea 11/26/2014   Chronic    Hereditary and idiopathic neuropathy 08/19/2013   History of kidney stones    HPV test positive    Hypercholesteremia 08/19/2013   Hypertension    Incomplete bladder emptying    Microscopic hematuria    MS (multiple sclerosis) (HCC)    Muscle spasticity 05/21/2014   Nonspecific findings on examination of urine    other   Osteopenia    PONV (postoperative nausea and vomiting)    Status post laparoscopic supracervical hysterectomy 11/26/2014   Tobacco user 11/26/2014   Wrist fracture    Past Surgical History:  Procedure Laterality Date   bilateral tubal ligation  1996   BREAST CYST EXCISION Left 2002   CYST EXCISION Left 05/10/2022   Procedure: CYST REMOVAL;  Surgeon: Rodolph Romano, MD;  Location: [facility] ORS;  Service: General;  Laterality: Left;   FRACTURE SURGERY     KNEE SURGERY     right   LAPAROSCOPIC SUPRACERVICAL HYSTERECTOMY  08/05/2013   ORIF WRIST FRACTURE Left 01/17/2017   Procedure: OPEN  REDUCTION INTERNAL FIXATION (ORIF) WRIST FRACTURE;  Surgeon: Leora Lynwood SAUNDERS, MD;  Location: [facility] ORS;  Service: Orthopedics;  Laterality: Left;   RADIOLOGY WITH ANESTHESIA N/A 03/18/2020   Procedure: MRI WITH ANESTHESIA CERVICAL SPINE AND BRAIN  WITH AND WITHOUT CONTRAST;  Surgeon: Radiologist, Medication, MD;  Location: [facility] OR;  Service: Radiology;  Laterality: N/A;   TUBAL LIGATION Bilateral    VAGINAL HYSTERECTOMY  03/2006   Patient Active Problem List   Diagnosis Date Noted   Hypokalemia 07/29/2022   Weakness of both lower extremities 07/29/2022   Abscess of left groin 11/15/2021   Abnormal LFTs 11/15/2021   Acute respiratory disease due to COVID-19 virus 11/21/2020   Weakness    Hypoalbuminemia due to protein-calorie malnutrition [facility])    Neurogenic bowel    Neurogenic bladder    Labile blood pressure    Neuropathic pain    Abscess of female pelvis    SVT (supraventricular tachycardia) (HCC)    Radial styloid tenosynovitis 03/12/2018   Wheelchair dependence 02/27/2018   Localized osteoporosis with current pathological fracture with routine healing 01/19/2017   Wrist fracture 01/16/2017   Sprain of ankle 03/23/2016   Closed fracture of lateral malleolus 03/16/2016   Health care maintenance 01/24/2016  Blood pressure elevated without history of HTN 10/25/2015   Essential hypertension 10/25/2015   Multiple sclerosis (HCC) 10/02/2015   Chronic left shoulder pain 07/19/2015   Multiple sclerosis exacerbation (HCC) 07/14/2015   MS (multiple sclerosis) (HCC) 11/26/2014   Increased body mass index 11/26/2014   HPV test positive 11/26/2014   Status post laparoscopic supracervical hysterectomy 11/26/2014   Galactorrhea 11/26/2014   Back ache 05/21/2014   Adiposity 05/21/2014   Disordered sleep 05/21/2014   Muscle spasticity 05/21/2014   Spasticity 05/21/2014   Calculus of kidney 12/09/2013   Renal colic 12/09/2013   Hypercholesteremia 08/19/2013   Hereditary and idiopathic  neuropathy 08/19/2013   Hypercholesterolemia without hypertriglyceridemia 08/19/2013   Bladder infection, chronic 07/25/2012   Disorder of bladder function 07/25/2012   Incomplete bladder emptying 07/25/2012   Microscopic hematuria 07/25/2012   Right upper quadrant pain 07/25/2012    ONSET DATE: 1995  REFERRING DIAG: MS, weakness, other closed fracture of proximal end of R tibia  THERAPY DIAG:  Muscle weakness (generalized)  Difficulty in walking, not elsewhere classified  Unsteadiness on feet  Rationale for Evaluation and Treatment: Rehabilitation  SUBJECTIVE:                                                                                                                                                                                             SUBJECTIVE STATEMENT: Patient presents with excellent motivation. Reports feeling cold but ready to work.    Pt accompanied by: self  PERTINENT HISTORY: Patient is returning to PT s/p ORIF of R tibia shaft fracture 09/23/2022. Patient has weakness in BLE with RLE>LLE. She drives with hand controls. Patient has been diagnosed with MS in 1995. PMH includes: back pain, CBP, chronic L shoulder pain, galactorrhea, neuropathy, HPV, hypercholestermeia, HTN, MS, osteopenia, PONV, wrist fracture. Additional order for other closed fracture of proximal end of R tibia with routine healing. Still has to use [employer].   PAIN:  Are you having pain? No  PRECAUTIONS: Fall  RED FLAGS: None   WEIGHT BEARING RESTRICTIONS: No  FALLS: Has patient fallen in last 6 months? Yes. Number of falls 1  LIVING ENVIRONMENT: Lives with: lives with their family Lives in: House/apartment Stairs:  ramp from garage into house Has following equipment at home: Vannie - 2 wheeled, shower chair, Grab bars, and Ramped entry  PLOF: Independent with household mobility with device  PATIENT GOALS: get up and pivot and turn, sit to stands. Return to walk, go to bathroom  on her own   OBJECTIVE:  Note: Objective measures were completed at Evaluation unless otherwise noted.  DIAGNOSTIC FINDINGS: MRI of  the brain 03/18/2020 showed multiple T2/FLAIR hyperintense foci in the periventricular, juxtacortical and deep white matter.  There were no infratentorial lesions noted.  None of the foci enhanced.   Due to severe claustrophobia, the study was done with conscious sedation in the hospital   SENSATION: Lack of sensation in feet, loss in bilateral lateral aspect of knee  LOWER EXTREMITY MMT:    MMT Right Eval Left Eval  Hip flexion 2- 2  Hip extension    Hip abduction 3+ 3+  Hip adduction 3 3  Hip internal rotation    Hip external rotation    Knee flexion 2 2+  Knee extension 2- 2  Ankle dorsiflexion 0 0  Ankle plantarflexion 0 0  Ankle inversion    Ankle eversion    (Blank rows = not tested)  TRANSFERS: Assistive device utilized: Environmental Consultant - 2 wheeled  Sit to stand: CGA Stand to sit: CGA  FUNCTIONAL TESTS:  5 times sit to stand: unable to complete 2 consecutive sit to stands  10 meter walk test: perform in future session   PATIENT SURVEYS:  FOTO 34  TODAY'S TREATMENT: DATE: 05/22/23 All activities performed with gait belt and CGA unless otherwise noted.   In // bars: Sit to stand weight shift x8 Sit to stand then walk to end of // bars with w/c follow and blocking of R knee with cues for weight shift onto RLE  Sit to stand decrease UE support to SUE and tap PT shoulder x 8 each arm Sit to stand    Seated: Slider: knee flexion/extension 10x each LE Black TB row 12x Black TB overhead raise 12x Black TB chest pull 15x      Pt requires significant therapeutic rest breaks throughout the session due to fatigue.  PATIENT EDUCATION: Education details: Pt educated throughout session about proper posture and technique with exercises. Improved exercise technique, movement at target joints, use of target muscles after min to mod verbal,  visual, tactile cues.  Person educated: Patient Education method: Explanation, Demonstration, Tactile cues, and Verbal cues Education comprehension: verbalized understanding, returned demonstration, verbal cues required, and tactile cues required  HOME EXERCISE PROGRAM:  To be given at next visit.   GOALS: Goals reviewed with patient? Yes  SHORT TERM GOALS: Target date: 03/06/2023  Patient will be independent in home exercise program to improve strength/mobility for better functional independence with ADLs.  Baseline: 10/1: give next session 11/5: HEP compliant Goal status: MET   LONG TERM GOALS: Target date:07/24/2023    Patient will increase FOTO score to equal to or greater than 48 to demonstrate statistically significant improvement in mobility and quality of life  Baseline: 10/1: 34%  11/5: 39.8% 12/12: 42%   Goal status: partially met   2.  Patient (> 58 years old) will complete five times sit to stand test in < 15 seconds indicating an increased LE strength and improved balance.  Baseline: 10/1; unable to tolerate two consecutive sit to stands 11/5: 2.19.26 5 partial sit to stands 12/12: 37.68s;One full sit to stand bring UE to walker, four partial STS without UE to walker  Goal status: Partially Met  3.  Patient will increase 10 meter walk test to >0.5 m/s as to improve gait speed for better community ambulation and to reduce fall risk.  Baseline: 10/1: not tested this session 11/5: unable to stand from wheelchair fully to start walking. 12/12: not appropriate at this time 12/24: takes 3 steps in previous sessions  Goal status: Ongoing  4.  Patient will transfer independently at home and no longer require use of Sara Plus Lift for increased independence.  Baseline: 10/1: reliant upon lift 11/5: reliant upon lift still 12/12: reliant upon lift still  Goal status: partially met    ASSESSMENT:  CLINICAL IMPRESSION: Patient presents with excellent motivation. She is  able to ambulate this session in // bars indicating return of strength s/p illness. Patient's stabilization is improving in standing however does have R knee buckling with fatigue. Pt will continue to benefit from skilled therapy to address remaining deficits in order to improve overall QoL and return to PLOF.   OBJECTIVE IMPAIRMENTS: Abnormal gait, decreased activity tolerance, decreased balance, decreased coordination, decreased endurance, decreased mobility, difficulty walking, decreased strength, impaired perceived functional ability, improper body mechanics, and postural dysfunction.   ACTIVITY LIMITATIONS: carrying, lifting, bending, standing, squatting, sleeping, transfers, bed mobility, bathing, toileting, dressing, reach over head, hygiene/grooming, locomotion level, and caring for others  PARTICIPATION LIMITATIONS: meal prep, cleaning, laundry, driving, shopping, community activity, yard work, and church  PERSONAL FACTORS: back pain, CBP, chronic L shoulder pain, galactorrhea, neuropathy, HPV, hypercholestermeia, HTN, MS, osteopenia, PONV, wrist fracture are also affecting patient's functional outcome.   REHAB POTENTIAL: Good  CLINICAL DECISION MAKING: Evolving/moderate complexity  EVALUATION COMPLEXITY: Moderate  PLAN:  PT FREQUENCY: 2x/week  PT DURATION: 12 weeks  PLANNED INTERVENTIONS: Therapeutic exercises, Therapeutic activity, Neuromuscular re-education, Balance training, Gait training, Patient/Family education, Self Care, Joint mobilization, Stair training, Vestibular training, Canalith repositioning, Orthotic/Fit training, DME instructions, Dry Needling, Electrical stimulation, Spinal mobilization, Cryotherapy, Moist heat, Compression bandaging, scar mobilization, Taping, Traction, Ultrasound, Manual therapy, and Re-evaluation  PLAN FOR NEXT SESSION:  Gait training, LE strengthening, HEP as appropriate   11:24 AM, 05/22/23 Khylah Kendra  Leopoldo, PT, DPT Physical Therapist -  [facility]  Outpatient Physical Therapy- Main Campus [phone]

## 2023-05-23 ENCOUNTER — Other Ambulatory Visit: Payer: Self-pay | Admitting: Urology

## 2023-05-23 NOTE — Therapy (Signed)
OUTPATIENT PHYSICAL THERAPY NEURO TREATMENT / Physical Therapy Progress Note   Dates of reporting period  04/19/23   to   05/24/23      Patient Name: Lisa Williams Sheeran MRN: [id number] DOB:[birth year], 63 y.o., female Today's Date: 05/24/2023   PCP: Lauro Regulus MD REFERRING PROVIDER: Lauro Regulus MD  END OF SESSION:  PT End of Session - 05/24/23 1015     Visit Number 30    Number of Visits 47    Date for PT Re-Evaluation 07/24/23    PT Start Time 1015    PT Stop Time 1058    PT Time Calculation (min) 43 min    Equipment Utilized During Treatment Gait belt    Activity Tolerance Patient tolerated treatment well    Behavior During Therapy [facility] for tasks assessed/performed                           Past Medical History:  Diagnosis Date   Abdominal pain, right upper quadrant    Back pain    Calculus of kidney 12/09/2013   Chronic back pain    unspecified   Chronic left shoulder pain 07/19/2015   Complication of anesthesia    Functional disorder of bladder    other   Galactorrhea 11/26/2014   Chronic    Hereditary and idiopathic neuropathy 08/19/2013   History of kidney stones    HPV test positive    Hypercholesteremia 08/19/2013   Hypertension    Incomplete bladder emptying    Microscopic hematuria    MS (multiple sclerosis) (HCC)    Muscle spasticity 05/21/2014   Nonspecific findings on examination of urine    other   Osteopenia    PONV (postoperative nausea and vomiting)    Status post laparoscopic supracervical hysterectomy 11/26/2014   Tobacco user 11/26/2014   Wrist fracture    Past Surgical History:  Procedure Laterality Date   bilateral tubal ligation  1996   BREAST CYST EXCISION Left 2002   CYST EXCISION Left 05/10/2022   Procedure: CYST REMOVAL;  Surgeon: Carolan Shiver, MD;  Location: [facility] ORS;  Service: General;  Laterality: Left;   FRACTURE SURGERY     KNEE SURGERY     right   LAPAROSCOPIC  SUPRACERVICAL HYSTERECTOMY  08/05/2013   ORIF WRIST FRACTURE Left 01/17/2017   Procedure: OPEN REDUCTION INTERNAL FIXATION (ORIF) WRIST FRACTURE;  Surgeon: Lyndle Herrlich, MD;  Location: [facility] ORS;  Service: Orthopedics;  Laterality: Left;   RADIOLOGY WITH ANESTHESIA N/A 03/18/2020   Procedure: MRI WITH ANESTHESIA CERVICAL SPINE AND BRAIN  WITH AND WITHOUT CONTRAST;  Surgeon: Radiologist, Medication, MD;  Location: [facility] OR;  Service: Radiology;  Laterality: N/A;   TUBAL LIGATION Bilateral    VAGINAL HYSTERECTOMY  03/2006   Patient Active Problem List   Diagnosis Date Noted   Hypokalemia 07/29/2022   Weakness of both lower extremities 07/29/2022   Abscess of left groin 11/15/2021   Abnormal LFTs 11/15/2021   Acute respiratory disease due to COVID-19 virus 11/21/2020   Weakness    Hypoalbuminemia due to protein-calorie malnutrition [facility])    Neurogenic bowel    Neurogenic bladder    Labile blood pressure    Neuropathic pain    Abscess of female pelvis    SVT (supraventricular tachycardia) (HCC)    Radial styloid tenosynovitis 03/12/2018   Wheelchair dependence 02/27/2018   Localized osteoporosis with current pathological fracture with routine healing 01/19/2017   Wrist fracture 01/16/2017  Sprain of ankle 03/23/2016   Closed fracture of lateral malleolus 03/16/2016   Health care maintenance 01/24/2016   Blood pressure elevated without history of HTN 10/25/2015   Essential hypertension 10/25/2015   Multiple sclerosis (HCC) 10/02/2015   Chronic left shoulder pain 07/19/2015   Multiple sclerosis exacerbation (HCC) 07/14/2015   MS (multiple sclerosis) (HCC) 11/26/2014   Increased body mass index 11/26/2014   HPV test positive 11/26/2014   Status post laparoscopic supracervical hysterectomy 11/26/2014   Galactorrhea 11/26/2014   Back ache 05/21/2014   Adiposity 05/21/2014   Disordered sleep 05/21/2014   Muscle spasticity 05/21/2014   Spasticity 05/21/2014   Calculus of kidney  12/09/2013   Renal colic 12/09/2013   Hypercholesteremia 08/19/2013   Hereditary and idiopathic neuropathy 08/19/2013   Hypercholesterolemia without hypertriglyceridemia 08/19/2013   Bladder infection, chronic 07/25/2012   Disorder of bladder function 07/25/2012   Incomplete bladder emptying 07/25/2012   Microscopic hematuria 07/25/2012   Right upper quadrant pain 07/25/2012    ONSET DATE: 1995  REFERRING DIAG: MS, weakness, other closed fracture of proximal end of R tibia  THERAPY DIAG:  Muscle weakness (generalized)  Difficulty in walking, not elsewhere classified  Unsteadiness on feet  Rationale for Evaluation and Treatment: Rehabilitation  SUBJECTIVE:                                                                                                                                                                                             SUBJECTIVE STATEMENT: Patient reports the cold is making her stiff. Has paperwork for her sit to stand chair.    Pt accompanied by: self  PERTINENT HISTORY: Patient is returning to PT s/p ORIF of R tibia shaft fracture 09/23/2022. Patient has weakness in BLE with RLE>LLE. She drives with hand controls. Patient has been diagnosed with MS in 1995. PMH includes: back pain, CBP, chronic L shoulder pain, galactorrhea, neuropathy, HPV, hypercholestermeia, HTN, MS, osteopenia, PONV, wrist fracture. Additional order for other closed fracture of proximal end of R tibia with routine healing. Still has to use [employer].   PAIN:  Are you having pain? No  PRECAUTIONS: Fall  RED FLAGS: None   WEIGHT BEARING RESTRICTIONS: No  FALLS: Has patient fallen in last 6 months? Yes. Number of falls 1  LIVING ENVIRONMENT: Lives with: lives with their family Lives in: House/apartment Stairs:  ramp from garage into house Has following equipment at home: Dan Humphreys - 2 wheeled, shower chair, Grab bars, and Ramped entry  PLOF: Independent with household mobility  with device  PATIENT GOALS: get up and pivot and turn, sit to stands. Return to walk, go  to bathroom on her own   OBJECTIVE:  Note: Objective measures were completed at Evaluation unless otherwise noted.  DIAGNOSTIC FINDINGS: MRI of the brain 03/18/2020 showed multiple T2/FLAIR hyperintense foci in the periventricular, juxtacortical and deep white matter.  There were no infratentorial lesions noted.  None of the foci enhanced.   Due to severe claustrophobia, the study was done with conscious sedation in the hospital   SENSATION: Lack of sensation in feet, loss in bilateral lateral aspect of knee  LOWER EXTREMITY MMT:    MMT Right Eval Left Eval  Hip flexion 2- 2  Hip extension    Hip abduction 3+ 3+  Hip adduction 3 3  Hip internal rotation    Hip external rotation    Knee flexion 2 2+  Knee extension 2- 2  Ankle dorsiflexion 0 0  Ankle plantarflexion 0 0  Ankle inversion    Ankle eversion    (Blank rows = not tested)  TRANSFERS: Assistive device utilized: Environmental consultant - 2 wheeled  Sit to stand: CGA Stand to sit: CGA  FUNCTIONAL TESTS:  5 times sit to stand: unable to complete 2 consecutive sit to stands  10 meter walk test: perform in future session   PATIENT SURVEYS:  FOTO 34  TODAY'S TREATMENT: DATE: 05/24/23 All activities performed with gait belt and CGA unless otherwise noted.   Physical therapy treatment session today consisted of completing assessment of goals and administration of testing as demonstrated and documented in flow sheet, treatment, and goals section of this note. Addition treatments may be found below.    In // bars: Sit to stand weight shift x8 Sit to stand decrease UE support to SUE and tap PT shoulder x 8 each arm Sit to stand    Seated: 15lb KB:  -bicep curl 10x -modified DL 6x   Pt requires significant therapeutic rest breaks throughout the session due to fatigue.  PATIENT EDUCATION: Education details: Pt educated throughout  session about proper posture and technique with exercises. Improved exercise technique, movement at target joints, use of target muscles after min to mod verbal, visual, tactile cues.  Person educated: Patient Education method: Explanation, Demonstration, Tactile cues, and Verbal cues Education comprehension: verbalized understanding, returned demonstration, verbal cues required, and tactile cues required  HOME EXERCISE PROGRAM:  To be given at next visit.   GOALS: Goals reviewed with patient? Yes  SHORT TERM GOALS: Target date: 03/06/2023  Patient will be independent in home exercise program to improve strength/mobility for better functional independence with ADLs.  Baseline: 10/1: give next session 11/5: HEP compliant Goal status: MET   LONG TERM GOALS: Target date:07/24/2023    Patient will increase FOTO score to equal to or greater than 48 to demonstrate statistically significant improvement in mobility and quality of life  Baseline: 10/1: 34%  11/5: 39.8% 12/12: 42% 1/16: 36%  Goal status: partially met   2.  Patient (> 31 years old) will complete five times sit to stand test in < 15 seconds indicating an increased LE strength and improved balance.  Baseline: 10/1; unable to tolerate two consecutive sit to stands 11/5: 2.19.26 5 partial sit to stands 12/12: 37.68s;One full sit to stand bring UE to walker, four partial STS without UE to walker  1/16: one full sit to stand; four partial STS without UE on walker 28.34  Goal status: Partially Met  3.  Patient will increase 10 meter walk test to >0.5 m/s as to improve gait speed for better community ambulation and  to reduce fall risk.  Baseline: 10/1: not tested this session 11/5: unable to stand from wheelchair fully to start walking. 12/12: not appropriate at this time 12/24: takes 3 steps in previous sessions 1/16: not able to get to full stand this session.  Goal status: Ongoing  4.  Patient will transfer independently at  home and no longer require use of Sara Plus Lift for increased independence.  Baseline: 10/1: reliant upon lift 11/5: reliant upon lift still 12/12: reliant upon lift still  1/16: requires use of sit to stander Goal status: partially met    ASSESSMENT:  CLINICAL IMPRESSION:  Patient's condition has the potential to improve in response to therapy. Maximum improvement is yet to be obtained. The anticipated improvement is attainable and reasonable in a generally predictable time. Patient is not yet ambulatory every session without use of // bars limited ability to perform 10 MWT. Continued focus on independent transfers and mobility will benefit patient.  Pt will continue to benefit from skilled therapy to address remaining deficits in order to improve overall QoL and return to PLOF.   OBJECTIVE IMPAIRMENTS: Abnormal gait, decreased activity tolerance, decreased balance, decreased coordination, decreased endurance, decreased mobility, difficulty walking, decreased strength, impaired perceived functional ability, improper body mechanics, and postural dysfunction.   ACTIVITY LIMITATIONS: carrying, lifting, bending, standing, squatting, sleeping, transfers, bed mobility, bathing, toileting, dressing, reach over head, hygiene/grooming, locomotion level, and caring for others  PARTICIPATION LIMITATIONS: meal prep, cleaning, laundry, driving, shopping, community activity, yard work, and church  PERSONAL FACTORS: back pain, CBP, chronic L shoulder pain, galactorrhea, neuropathy, HPV, hypercholestermeia, HTN, MS, osteopenia, PONV, wrist fracture are also affecting patient's functional outcome.   REHAB POTENTIAL: Good  CLINICAL DECISION MAKING: Evolving/moderate complexity  EVALUATION COMPLEXITY: Moderate  PLAN:  PT FREQUENCY: 2x/week  PT DURATION: 12 weeks  PLANNED INTERVENTIONS: Therapeutic exercises, Therapeutic activity, Neuromuscular re-education, Balance training, Gait training,  Patient/Family education, Self Care, Joint mobilization, Stair training, Vestibular training, Canalith repositioning, Orthotic/Fit training, DME instructions, Dry Needling, Electrical stimulation, Spinal mobilization, Cryotherapy, Moist heat, Compression bandaging, scar mobilization, Taping, Traction, Ultrasound, Manual therapy, and Re-evaluation  PLAN FOR NEXT SESSION:  Gait training, LE strengthening, HEP as appropriate   1:38 PM, 05/24/23 Precious Bard, PT, DPT Physical Therapist - [facility]  Outpatient Physical Therapy- Main [facility] [phone]

## 2023-05-24 ENCOUNTER — Ambulatory Visit: Payer: PPO

## 2023-05-24 DIAGNOSIS — M6281 Muscle weakness (generalized): Secondary | ICD-10-CM | POA: Diagnosis not present

## 2023-05-24 DIAGNOSIS — R262 Difficulty in walking, not elsewhere classified: Secondary | ICD-10-CM

## 2023-05-24 DIAGNOSIS — R2681 Unsteadiness on feet: Secondary | ICD-10-CM

## 2023-05-28 NOTE — Therapy (Signed)
OUTPATIENT PHYSICAL THERAPY NEURO TREATMENT      Patient Name: Lisa Williams MRN: [id number] DOB:09-May-1960, 63 y.o., female Today's Date: 05/29/2023   PCP: Lauro Regulus MD REFERRING PROVIDER: Lauro Regulus MD  END OF SESSION:  PT End of Session - 05/29/23 1014     Visit Number 31    Number of Visits 47    Date for PT Re-Evaluation 07/24/23    PT Start Time 1015    PT Stop Time 1059    PT Time Calculation (min) 44 min    Equipment Utilized During Treatment Gait belt    Activity Tolerance Patient tolerated treatment well    Behavior During Therapy [facility] for tasks assessed/performed                            Past Medical History:  Diagnosis Date   Abdominal pain, right upper quadrant    Back pain    Calculus of kidney 12/09/2013   Chronic back pain    unspecified   Chronic left shoulder pain 07/19/2015   Complication of anesthesia    Functional disorder of bladder    other   Galactorrhea 11/26/2014   Chronic    Hereditary and idiopathic neuropathy 08/19/2013   History of kidney stones    HPV test positive    Hypercholesteremia 08/19/2013   Hypertension    Incomplete bladder emptying    Microscopic hematuria    MS (multiple sclerosis) (HCC)    Muscle spasticity 05/21/2014   Nonspecific findings on examination of urine    other   Osteopenia    PONV (postoperative nausea and vomiting)    Status post laparoscopic supracervical hysterectomy 11/26/2014   Tobacco user 11/26/2014   Wrist fracture    Past Surgical History:  Procedure Laterality Date   bilateral tubal ligation  1996   BREAST CYST EXCISION Left 2002   CYST EXCISION Left 05/10/2022   Procedure: CYST REMOVAL;  Surgeon: Carolan Shiver, MD;  Location: [facility] ORS;  Service: General;  Laterality: Left;   FRACTURE SURGERY     KNEE SURGERY     right   LAPAROSCOPIC SUPRACERVICAL HYSTERECTOMY  08/05/2013   ORIF WRIST FRACTURE Left 01/17/2017   Procedure: OPEN  REDUCTION INTERNAL FIXATION (ORIF) WRIST FRACTURE;  Surgeon: Lyndle Herrlich, MD;  Location: [facility] ORS;  Service: Orthopedics;  Laterality: Left;   RADIOLOGY WITH ANESTHESIA N/A 03/18/2020   Procedure: MRI WITH ANESTHESIA CERVICAL SPINE AND BRAIN  WITH AND WITHOUT CONTRAST;  Surgeon: Radiologist, Medication, MD;  Location: [facility] OR;  Service: Radiology;  Laterality: N/A;   TUBAL LIGATION Bilateral    VAGINAL HYSTERECTOMY  03/2006   Patient Active Problem List   Diagnosis Date Noted   Hypokalemia 07/29/2022   Weakness of both lower extremities 07/29/2022   Abscess of left groin 11/15/2021   Abnormal LFTs 11/15/2021   Acute respiratory disease due to COVID-19 virus 11/21/2020   Weakness    Hypoalbuminemia due to protein-calorie malnutrition [facility])    Neurogenic bowel    Neurogenic bladder    Labile blood pressure    Neuropathic pain    Abscess of female pelvis    SVT (supraventricular tachycardia) (HCC)    Radial styloid tenosynovitis 03/12/2018   Wheelchair dependence 02/27/2018   Localized osteoporosis with current pathological fracture with routine healing 01/19/2017   Wrist fracture 01/16/2017   Sprain of ankle 03/23/2016   Closed fracture of lateral malleolus 03/16/2016   Health care  maintenance 01/24/2016   Blood pressure elevated without history of HTN 10/25/2015   Essential hypertension 10/25/2015   Multiple sclerosis (HCC) 10/02/2015   Chronic left shoulder pain 07/19/2015   Multiple sclerosis exacerbation (HCC) 07/14/2015   MS (multiple sclerosis) (HCC) 11/26/2014   Increased body mass index 11/26/2014   HPV test positive 11/26/2014   Status post laparoscopic supracervical hysterectomy 11/26/2014   Galactorrhea 11/26/2014   Back ache 05/21/2014   Adiposity 05/21/2014   Disordered sleep 05/21/2014   Muscle spasticity 05/21/2014   Spasticity 05/21/2014   Calculus of kidney 12/09/2013   Renal colic 12/09/2013   Hypercholesteremia 08/19/2013   Hereditary and idiopathic  neuropathy 08/19/2013   Hypercholesterolemia without hypertriglyceridemia 08/19/2013   Bladder infection, chronic 07/25/2012   Disorder of bladder function 07/25/2012   Incomplete bladder emptying 07/25/2012   Microscopic hematuria 07/25/2012   Right upper quadrant pain 07/25/2012    ONSET DATE: 1995  REFERRING DIAG: MS, weakness, other closed fracture of proximal end of R tibia  THERAPY DIAG:  Muscle weakness (generalized)  Difficulty in walking, not elsewhere classified  Unsteadiness on feet  Other lack of coordination  Rationale for Evaluation and Treatment: Rehabilitation  SUBJECTIVE:                                                                                                                                                                                             SUBJECTIVE STATEMENT: Patient reports she is cold but doing well.     Pt accompanied by: self  PERTINENT HISTORY: Patient is returning to PT s/p ORIF of R tibia shaft fracture 09/23/2022. Patient has weakness in BLE with RLE>LLE. She drives with hand controls. Patient has been diagnosed with MS in 1995. PMH includes: back pain, CBP, chronic L shoulder pain, galactorrhea, neuropathy, HPV, hypercholestermeia, HTN, MS, osteopenia, PONV, wrist fracture. Additional order for other closed fracture of proximal end of R tibia with routine healing. Still has to use [employer].   PAIN:  Are you having pain? No  PRECAUTIONS: Fall  RED FLAGS: None   WEIGHT BEARING RESTRICTIONS: No  FALLS: Has patient fallen in last 6 months? Yes. Number of falls 1  LIVING ENVIRONMENT: Lives with: lives with their family Lives in: House/apartment Stairs:  ramp from garage into house Has following equipment at home: Dan Humphreys - 2 wheeled, shower chair, Grab bars, and Ramped entry  PLOF: Independent with household mobility with device  PATIENT GOALS: get up and pivot and turn, sit to stands. Return to walk, go to bathroom on her  own   OBJECTIVE:  Note: Objective measures were completed at Evaluation unless otherwise  noted.  DIAGNOSTIC FINDINGS: MRI of the brain 03/18/2020 showed multiple T2/FLAIR hyperintense foci in the periventricular, juxtacortical and deep white matter.  There were no infratentorial lesions noted.  None of the foci enhanced.   Due to severe claustrophobia, the study was done with conscious sedation in the hospital   SENSATION: Lack of sensation in feet, loss in bilateral lateral aspect of knee  LOWER EXTREMITY MMT:    MMT Right Eval Left Eval  Hip flexion 2- 2  Hip extension    Hip abduction 3+ 3+  Hip adduction 3 3  Hip internal rotation    Hip external rotation    Knee flexion 2 2+  Knee extension 2- 2  Ankle dorsiflexion 0 0  Ankle plantarflexion 0 0  Ankle inversion    Ankle eversion    (Blank rows = not tested)  TRANSFERS: Assistive device utilized: Environmental consultant - 2 wheeled  Sit to stand: CGA Stand to sit: CGA  FUNCTIONAL TESTS:  5 times sit to stand: unable to complete 2 consecutive sit to stands  10 meter walk test: perform in future session   PATIENT SURVEYS:  FOTO 34  TODAY'S TREATMENT: DATE: 05/29/23 All activities performed with gait belt and CGA unless otherwise noted.     In // bars: Sit to stand LLE forward step /backward step x5 attempts very challenging to perform Sit to stand ambulate length of // bars with max cueing for weight shift and lifting limb    Seated: RTB abduction/adduction 10x  Slider: knee flexion/extension 10x each LE Black TB row 12x Black TB overhead raise 12x Black TB chest pull 15x     Pt requires significant therapeutic rest breaks throughout the session due to fatigue.  PATIENT EDUCATION: Education details: Pt educated throughout session about proper posture and technique with exercises. Improved exercise technique, movement at target joints, use of target muscles after min to mod verbal, visual, tactile cues.  Person  educated: Patient Education method: Explanation, Demonstration, Tactile cues, and Verbal cues Education comprehension: verbalized understanding, returned demonstration, verbal cues required, and tactile cues required  HOME EXERCISE PROGRAM:  To be given at next visit.   GOALS: Goals reviewed with patient? Yes  SHORT TERM GOALS: Target date: 03/06/2023  Patient will be independent in home exercise program to improve strength/mobility for better functional independence with ADLs.  Baseline: 10/1: give next session 11/5: HEP compliant Goal status: MET   LONG TERM GOALS: Target date:07/24/2023    Patient will increase FOTO score to equal to or greater than 48 to demonstrate statistically significant improvement in mobility and quality of life  Baseline: 10/1: 34%  11/5: 39.8% 12/12: 42% 1/16: 36%  Goal status: partially met   2.  Patient (> 61 years old) will complete five times sit to stand test in < 15 seconds indicating an increased LE strength and improved balance.  Baseline: 10/1; unable to tolerate two consecutive sit to stands 11/5: 2.19.26 5 partial sit to stands 12/12: 37.68s;One full sit to stand bring UE to walker, four partial STS without UE to walker  1/16: one full sit to stand; four partial STS without UE on walker 28.34  Goal status: Partially Met  3.  Patient will increase 10 meter walk test to >0.5 m/s as to improve gait speed for better community ambulation and to reduce fall risk.  Baseline: 10/1: not tested this session 11/5: unable to stand from wheelchair fully to start walking. 12/12: not appropriate at this time 12/24: takes 3 steps  in previous sessions 1/16: not able to get to full stand this session.  Goal status: Ongoing  4.  Patient will transfer independently at home and no longer require use of Sara Plus Lift for increased independence.  Baseline: 10/1: reliant upon lift 11/5: reliant upon lift still 12/12: reliant upon lift still  1/16: requires use of  sit to stander Goal status: partially met    ASSESSMENT:  CLINICAL IMPRESSION: Patient tolerates ambulation of length of // bars this session and is fatigued but pleased with mobility for the day. Sit to stands continue to be a challenge and fatiguing to patient at this time.  Pt will continue to benefit from skilled therapy to address remaining deficits in order to improve overall QoL and return to PLOF.   OBJECTIVE IMPAIRMENTS: Abnormal gait, decreased activity tolerance, decreased balance, decreased coordination, decreased endurance, decreased mobility, difficulty walking, decreased strength, impaired perceived functional ability, improper body mechanics, and postural dysfunction.   ACTIVITY LIMITATIONS: carrying, lifting, bending, standing, squatting, sleeping, transfers, bed mobility, bathing, toileting, dressing, reach over head, hygiene/grooming, locomotion level, and caring for others  PARTICIPATION LIMITATIONS: meal prep, cleaning, laundry, driving, shopping, community activity, yard work, and church  PERSONAL FACTORS: back pain, CBP, chronic L shoulder pain, galactorrhea, neuropathy, HPV, hypercholestermeia, HTN, MS, osteopenia, PONV, wrist fracture are also affecting patient's functional outcome.   REHAB POTENTIAL: Good  CLINICAL DECISION MAKING: Evolving/moderate complexity  EVALUATION COMPLEXITY: Moderate  PLAN:  PT FREQUENCY: 2x/week  PT DURATION: 12 weeks  PLANNED INTERVENTIONS: Therapeutic exercises, Therapeutic activity, Neuromuscular re-education, Balance training, Gait training, Patient/Family education, Self Care, Joint mobilization, Stair training, Vestibular training, Canalith repositioning, Orthotic/Fit training, DME instructions, Dry Needling, Electrical stimulation, Spinal mobilization, Cryotherapy, Moist heat, Compression bandaging, scar mobilization, Taping, Traction, Ultrasound, Manual therapy, and Re-evaluation  PLAN FOR NEXT SESSION:  Gait training, LE  strengthening, HEP as appropriate   11:31 AM, 05/29/23 Precious Bard, PT, DPT Physical Therapist - [facility]  Outpatient Physical Therapy- Main [facility] [phone]

## 2023-05-29 ENCOUNTER — Ambulatory Visit: Payer: PPO

## 2023-05-29 DIAGNOSIS — R278 Other lack of coordination: Secondary | ICD-10-CM

## 2023-05-29 DIAGNOSIS — M6281 Muscle weakness (generalized): Secondary | ICD-10-CM | POA: Diagnosis not present

## 2023-05-29 DIAGNOSIS — R2681 Unsteadiness on feet: Secondary | ICD-10-CM

## 2023-05-29 DIAGNOSIS — R262 Difficulty in walking, not elsewhere classified: Secondary | ICD-10-CM

## 2023-05-30 DIAGNOSIS — R262 Difficulty in walking, not elsewhere classified: Secondary | ICD-10-CM | POA: Diagnosis not present

## 2023-05-30 DIAGNOSIS — M6281 Muscle weakness (generalized): Secondary | ICD-10-CM | POA: Diagnosis not present

## 2023-05-30 DIAGNOSIS — G35 Multiple sclerosis: Secondary | ICD-10-CM | POA: Diagnosis not present

## 2023-05-30 DIAGNOSIS — M810 Age-related osteoporosis without current pathological fracture: Secondary | ICD-10-CM | POA: Diagnosis not present

## 2023-05-31 ENCOUNTER — Ambulatory Visit: Payer: PPO

## 2023-06-04 NOTE — Therapy (Signed)
OUTPATIENT PHYSICAL THERAPY NEURO TREATMENT      Patient Name: Lisa Williams MRN: [id number] DOB:31-Dec-1960, 63 y.o., female Today's Date: 06/05/2023   PCP: Lauro Regulus MD REFERRING PROVIDER: Lauro Regulus MD  END OF SESSION:  PT End of Session - 06/05/23 1013     Visit Number 32    Number of Visits 47    Date for PT Re-Evaluation 07/24/23    PT Start Time 1015    PT Stop Time 1059    PT Time Calculation (min) 44 min    Equipment Utilized During Treatment Gait belt    Activity Tolerance Patient tolerated treatment well    Behavior During Therapy [facility] for tasks assessed/performed                             Past Medical History:  Diagnosis Date   Abdominal pain, right upper quadrant    Back pain    Calculus of kidney 12/09/2013   Chronic back pain    unspecified   Chronic left shoulder pain 07/19/2015   Complication of anesthesia    Functional disorder of bladder    other   Galactorrhea 11/26/2014   Chronic    Hereditary and idiopathic neuropathy 08/19/2013   History of kidney stones    HPV test positive    Hypercholesteremia 08/19/2013   Hypertension    Incomplete bladder emptying    Microscopic hematuria    MS (multiple sclerosis) (HCC)    Muscle spasticity 05/21/2014   Nonspecific findings on examination of urine    other   Osteopenia    PONV (postoperative nausea and vomiting)    Status post laparoscopic supracervical hysterectomy 11/26/2014   Tobacco user 11/26/2014   Wrist fracture    Past Surgical History:  Procedure Laterality Date   bilateral tubal ligation  1996   BREAST CYST EXCISION Left 2002   CYST EXCISION Left 05/10/2022   Procedure: CYST REMOVAL;  Surgeon: Carolan Shiver, MD;  Location: [facility] ORS;  Service: General;  Laterality: Left;   FRACTURE SURGERY     KNEE SURGERY     right   LAPAROSCOPIC SUPRACERVICAL HYSTERECTOMY  08/05/2013   ORIF WRIST FRACTURE Left 01/17/2017   Procedure:  OPEN REDUCTION INTERNAL FIXATION (ORIF) WRIST FRACTURE;  Surgeon: Lyndle Herrlich, MD;  Location: [facility] ORS;  Service: Orthopedics;  Laterality: Left;   RADIOLOGY WITH ANESTHESIA N/A 03/18/2020   Procedure: MRI WITH ANESTHESIA CERVICAL SPINE AND BRAIN  WITH AND WITHOUT CONTRAST;  Surgeon: Radiologist, Medication, MD;  Location: [facility];  Service: Radiology;  Laterality: N/A;   TUBAL LIGATION Bilateral    VAGINAL HYSTERECTOMY  03/2006   Patient Active Problem List   Diagnosis Date Noted   Hypokalemia 07/29/2022   Weakness of both lower extremities 07/29/2022   Abscess of left groin 11/15/2021   Abnormal LFTs 11/15/2021   Acute respiratory disease due to COVID-19 virus 11/21/2020   Weakness    Hypoalbuminemia due to protein-calorie malnutrition [facility])    Neurogenic bowel    Neurogenic bladder    Labile blood pressure    Neuropathic pain    Abscess of female pelvis    SVT (supraventricular tachycardia) (HCC)    Radial styloid tenosynovitis 03/12/2018   Wheelchair dependence 02/27/2018   Localized osteoporosis with current pathological fracture with routine healing 01/19/2017   Wrist fracture 01/16/2017   Sprain of ankle 03/23/2016   Closed fracture of lateral malleolus 03/16/2016   Health  care maintenance 01/24/2016   Blood pressure elevated without history of HTN 10/25/2015   Essential hypertension 10/25/2015   Multiple sclerosis (HCC) 10/02/2015   Chronic left shoulder pain 07/19/2015   Multiple sclerosis exacerbation (HCC) 07/14/2015   MS (multiple sclerosis) (HCC) 11/26/2014   Increased body mass index 11/26/2014   HPV test positive 11/26/2014   Status post laparoscopic supracervical hysterectomy 11/26/2014   Galactorrhea 11/26/2014   Back ache 05/21/2014   Adiposity 05/21/2014   Disordered sleep 05/21/2014   Muscle spasticity 05/21/2014   Spasticity 05/21/2014   Calculus of kidney 12/09/2013   Renal colic 12/09/2013   Hypercholesteremia 08/19/2013   Hereditary and  idiopathic neuropathy 08/19/2013   Hypercholesterolemia without hypertriglyceridemia 08/19/2013   Bladder infection, chronic 07/25/2012   Disorder of bladder function 07/25/2012   Incomplete bladder emptying 07/25/2012   Microscopic hematuria 07/25/2012   Right upper quadrant pain 07/25/2012    ONSET DATE: 1995  REFERRING DIAG: MS, weakness, other closed fracture of proximal end of R tibia  THERAPY DIAG:  Muscle weakness (generalized)  Difficulty in walking, not elsewhere classified  Unsteadiness on feet  Other abnormalities of gait and mobility  Rationale for Evaluation and Treatment: Rehabilitation  SUBJECTIVE:                                                                                                                                                                                             SUBJECTIVE STATEMENT: Patient reports she is feeling good, has new shoes on today.    Pt accompanied by: self  PERTINENT HISTORY: Patient is returning to PT s/p ORIF of R tibia shaft fracture 09/23/2022. Patient has weakness in BLE with RLE>LLE. She drives with hand controls. Patient has been diagnosed with MS in 1995. PMH includes: back pain, CBP, chronic L shoulder pain, galactorrhea, neuropathy, HPV, hypercholestermeia, HTN, MS, osteopenia, PONV, wrist fracture. Additional order for other closed fracture of proximal end of R tibia with routine healing. Still has to use [employer].   PAIN:  Are you having pain? No  PRECAUTIONS: Fall  RED FLAGS: None   WEIGHT BEARING RESTRICTIONS: No  FALLS: Has patient fallen in last 6 months? Yes. Number of falls 1  LIVING ENVIRONMENT: Lives with: lives with their family Lives in: House/apartment Stairs:  ramp from garage into house Has following equipment at home: Dan Humphreys - 2 wheeled, shower chair, Grab bars, and Ramped entry  PLOF: Independent with household mobility with device  PATIENT GOALS: get up and pivot and turn, sit to  stands. Return to walk, go to bathroom on her own   OBJECTIVE:  Note: Objective measures were  completed at Evaluation unless otherwise noted.  DIAGNOSTIC FINDINGS: MRI of the brain 03/18/2020 showed multiple T2/FLAIR hyperintense foci in the periventricular, juxtacortical and deep white matter.  There were no infratentorial lesions noted.  None of the foci enhanced.   Due to severe claustrophobia, the study was done with conscious sedation in the hospital   SENSATION: Lack of sensation in feet, loss in bilateral lateral aspect of knee  LOWER EXTREMITY MMT:    MMT Right Eval Left Eval  Hip flexion 2- 2  Hip extension    Hip abduction 3+ 3+  Hip adduction 3 3  Hip internal rotation    Hip external rotation    Knee flexion 2 2+  Knee extension 2- 2  Ankle dorsiflexion 0 0  Ankle plantarflexion 0 0  Ankle inversion    Ankle eversion    (Blank rows = not tested)  TRANSFERS: Assistive device utilized: Environmental consultant - 2 wheeled  Sit to stand: CGA Stand to sit: CGA  FUNCTIONAL TESTS:  5 times sit to stand: unable to complete 2 consecutive sit to stands  10 meter walk test: perform in future session   PATIENT SURVEYS:  FOTO 34  TODAY'S TREATMENT: DATE: 06/05/23 All activities performed with gait belt and CGA unless otherwise noted.     In // bars: Sit to stand no UE support 4x5 second holds  Sit to stand ambulate length of // bars with max cueing for weight shift and lifting limb Attempted third stand x 3 attempts.    Seated: GTB abduction/adduction 10x   Pt requires significant therapeutic rest breaks throughout the session due to fatigue.  PATIENT EDUCATION: Education details: Pt educated throughout session about proper posture and technique with exercises. Improved exercise technique, movement at target joints, use of target muscles after min to mod verbal, visual, tactile cues.  Person educated: Patient Education method: Explanation, Demonstration, Tactile cues, and  Verbal cues Education comprehension: verbalized understanding, returned demonstration, verbal cues required, and tactile cues required  HOME EXERCISE PROGRAM:  To be given at next visit.   GOALS: Goals reviewed with patient? Yes  SHORT TERM GOALS: Target date: 03/06/2023  Patient will be independent in home exercise program to improve strength/mobility for better functional independence with ADLs.  Baseline: 10/1: give next session 11/5: HEP compliant Goal status: MET   LONG TERM GOALS: Target date:07/24/2023    Patient will increase FOTO score to equal to or greater than 48 to demonstrate statistically significant improvement in mobility and quality of life  Baseline: 10/1: 34%  11/5: 39.8% 12/12: 42% 1/16: 36%  Goal status: partially met   2.  Patient (> 25 years old) will complete five times sit to stand test in < 15 seconds indicating an increased LE strength and improved balance.  Baseline: 10/1; unable to tolerate two consecutive sit to stands 11/5: 2.19.26 5 partial sit to stands 12/12: 37.68s;One full sit to stand bring UE to walker, four partial STS without UE to walker  1/16: one full sit to stand; four partial STS without UE on walker 28.34  Goal status: Partially Met  3.  Patient will increase 10 meter walk test to >0.5 m/s as to improve gait speed for better community ambulation and to reduce fall risk.  Baseline: 10/1: not tested this session 11/5: unable to stand from wheelchair fully to start walking. 12/12: not appropriate at this time 12/24: takes 3 steps in previous sessions 1/16: not able to get to full stand this session.  Goal status:  Ongoing  4.  Patient will transfer independently at home and no longer require use of Sara Plus Lift for increased independence.  Baseline: 10/1: reliant upon lift 11/5: reliant upon lift still 12/12: reliant upon lift still  1/16: requires use of sit to stander Goal status: partially met    ASSESSMENT:  CLINICAL  IMPRESSION: Patient tolerates standing without UE support at start of pt session. Multiple rounds of no hands support performed prior rto ambulation this session. Patient remains highly motivated throughout session and eager to progress functional mobility. Pt will continue to benefit from skilled therapy to address remaining deficits in order to improve overall QoL and return to PLOF.   OBJECTIVE IMPAIRMENTS: Abnormal gait, decreased activity tolerance, decreased balance, decreased coordination, decreased endurance, decreased mobility, difficulty walking, decreased strength, impaired perceived functional ability, improper body mechanics, and postural dysfunction.   ACTIVITY LIMITATIONS: carrying, lifting, bending, standing, squatting, sleeping, transfers, bed mobility, bathing, toileting, dressing, reach over head, hygiene/grooming, locomotion level, and caring for others  PARTICIPATION LIMITATIONS: meal prep, cleaning, laundry, driving, shopping, community activity, yard work, and church  PERSONAL FACTORS: back pain, CBP, chronic L shoulder pain, galactorrhea, neuropathy, HPV, hypercholestermeia, HTN, MS, osteopenia, PONV, wrist fracture are also affecting patient's functional outcome.   REHAB POTENTIAL: Good  CLINICAL DECISION MAKING: Evolving/moderate complexity  EVALUATION COMPLEXITY: Moderate  PLAN:  PT FREQUENCY: 2x/week  PT DURATION: 12 weeks  PLANNED INTERVENTIONS: Therapeutic exercises, Therapeutic activity, Neuromuscular re-education, Balance training, Gait training, Patient/Family education, Self Care, Joint mobilization, Stair training, Vestibular training, Canalith repositioning, Orthotic/Fit training, DME instructions, Dry Needling, Electrical stimulation, Spinal mobilization, Cryotherapy, Moist heat, Compression bandaging, scar mobilization, Taping, Traction, Ultrasound, Manual therapy, and Re-evaluation  PLAN FOR NEXT SESSION:  Gait training, LE strengthening, HEP as  appropriate   10:59 AM, 06/05/23 Precious Bard, PT, DPT Physical Therapist - [place] [facility]  Outpatient Physical Therapy- Main Campus [phone]

## 2023-06-05 ENCOUNTER — Ambulatory Visit: Payer: PPO

## 2023-06-05 DIAGNOSIS — M6281 Muscle weakness (generalized): Secondary | ICD-10-CM

## 2023-06-05 DIAGNOSIS — R2681 Unsteadiness on feet: Secondary | ICD-10-CM

## 2023-06-05 DIAGNOSIS — R262 Difficulty in walking, not elsewhere classified: Secondary | ICD-10-CM

## 2023-06-05 DIAGNOSIS — R2689 Other abnormalities of gait and mobility: Secondary | ICD-10-CM

## 2023-06-06 NOTE — Therapy (Signed)
OUTPATIENT PHYSICAL THERAPY NEURO TREATMENT      Patient Name: Lisa Williams MRN: [id number] DOB:April 10, 1961, 63 y.o., female Today's Date: 06/07/2023   PCP: Lauro Regulus MD REFERRING PROVIDER: Lauro Regulus MD  END OF SESSION:  PT End of Session - 06/07/23 1014     Visit Number 33    Number of Visits 47    Date for PT Re-Evaluation 07/24/23    PT Start Time 1015    PT Stop Time 1059    PT Time Calculation (min) 44 min    Equipment Utilized During Treatment Gait belt    Activity Tolerance Patient tolerated treatment well    Behavior During Therapy [facility] for tasks assessed/performed                              Past Medical History:  Diagnosis Date   Abdominal pain, right upper quadrant    Back pain    Calculus of kidney 12/09/2013   Chronic back pain    unspecified   Chronic left shoulder pain 07/19/2015   Complication of anesthesia    Functional disorder of bladder    other   Galactorrhea 11/26/2014   Chronic    Hereditary and idiopathic neuropathy 08/19/2013   History of kidney stones    HPV test positive    Hypercholesteremia 08/19/2013   Hypertension    Incomplete bladder emptying    Microscopic hematuria    MS (multiple sclerosis) (HCC)    Muscle spasticity 05/21/2014   Nonspecific findings on examination of urine    other   Osteopenia    PONV (postoperative nausea and vomiting)    Status post laparoscopic supracervical hysterectomy 11/26/2014   Tobacco user 11/26/2014   Wrist fracture    Past Surgical History:  Procedure Laterality Date   bilateral tubal ligation  1996   BREAST CYST EXCISION Left 2002   CYST EXCISION Left 05/10/2022   Procedure: CYST REMOVAL;  Surgeon: Carolan Shiver, MD;  Location: [facility] ORS;  Service: General;  Laterality: Left;   FRACTURE SURGERY     KNEE SURGERY     right   LAPAROSCOPIC SUPRACERVICAL HYSTERECTOMY  08/05/2013   ORIF WRIST FRACTURE Left 01/17/2017   Procedure:  OPEN REDUCTION INTERNAL FIXATION (ORIF) WRIST FRACTURE;  Surgeon: Lyndle Herrlich, MD;  Location: [facility] ORS;  Service: Orthopedics;  Laterality: Left;   RADIOLOGY WITH ANESTHESIA N/A 03/18/2020   Procedure: MRI WITH ANESTHESIA CERVICAL SPINE AND BRAIN  WITH AND WITHOUT CONTRAST;  Surgeon: Radiologist, Medication, MD;  Location: [facility] OR;  Service: Radiology;  Laterality: N/A;   TUBAL LIGATION Bilateral    VAGINAL HYSTERECTOMY  03/2006   Patient Active Problem List   Diagnosis Date Noted   Hypokalemia 07/29/2022   Weakness of both lower extremities 07/29/2022   Abscess of left groin 11/15/2021   Abnormal LFTs 11/15/2021   Acute respiratory disease due to COVID-19 virus 11/21/2020   Weakness    Hypoalbuminemia due to protein-calorie malnutrition [facility])    Neurogenic bowel    Neurogenic bladder    Labile blood pressure    Neuropathic pain    Abscess of female pelvis    SVT (supraventricular tachycardia) (HCC)    Radial styloid tenosynovitis 03/12/2018   Wheelchair dependence 02/27/2018   Localized osteoporosis with current pathological fracture with routine healing 01/19/2017   Wrist fracture 01/16/2017   Sprain of ankle 03/23/2016   Closed fracture of lateral malleolus 03/16/2016  Health care maintenance 01/24/2016   Blood pressure elevated without history of HTN 10/25/2015   Essential hypertension 10/25/2015   Multiple sclerosis (HCC) 10/02/2015   Chronic left shoulder pain 07/19/2015   Multiple sclerosis exacerbation (HCC) 07/14/2015   MS (multiple sclerosis) (HCC) 11/26/2014   Increased body mass index 11/26/2014   HPV test positive 11/26/2014   Status post laparoscopic supracervical hysterectomy 11/26/2014   Galactorrhea 11/26/2014   Back ache 05/21/2014   Adiposity 05/21/2014   Disordered sleep 05/21/2014   Muscle spasticity 05/21/2014   Spasticity 05/21/2014   Calculus of kidney 12/09/2013   Renal colic 12/09/2013   Hypercholesteremia 08/19/2013   Hereditary and  idiopathic neuropathy 08/19/2013   Hypercholesterolemia without hypertriglyceridemia 08/19/2013   Bladder infection, chronic 07/25/2012   Disorder of bladder function 07/25/2012   Incomplete bladder emptying 07/25/2012   Microscopic hematuria 07/25/2012   Right upper quadrant pain 07/25/2012    ONSET DATE: 1995  REFERRING DIAG: MS, weakness, other closed fracture of proximal end of R tibia  THERAPY DIAG:  Muscle weakness (generalized)  Difficulty in walking, not elsewhere classified  Unsteadiness on feet  Other abnormalities of gait and mobility  Rationale for Evaluation and Treatment: Rehabilitation  SUBJECTIVE:                                                                                                                                                                                             SUBJECTIVE STATEMENT: Patient is wearing her light shoes today. Ready to work.    Pt accompanied by: self  PERTINENT HISTORY: Patient is returning to PT s/p ORIF of R tibia shaft fracture 09/23/2022. Patient has weakness in BLE with RLE>LLE. She drives with hand controls. Patient has been diagnosed with MS in 1995. PMH includes: back pain, CBP, chronic L shoulder pain, galactorrhea, neuropathy, HPV, hypercholestermeia, HTN, MS, osteopenia, PONV, wrist fracture. Additional order for other closed fracture of proximal end of R tibia with routine healing. Still has to use [employer].   PAIN:  Are you having pain? No  PRECAUTIONS: Fall  RED FLAGS: None   WEIGHT BEARING RESTRICTIONS: No  FALLS: Has patient fallen in last 6 months? Yes. Number of falls 1  LIVING ENVIRONMENT: Lives with: lives with their family Lives in: House/apartment Stairs:  ramp from garage into house Has following equipment at home: Dan Humphreys - 2 wheeled, shower chair, Grab bars, and Ramped entry  PLOF: Independent with household mobility with device  PATIENT GOALS: get up and pivot and turn, sit to stands.  Return to walk, go to bathroom on her own   OBJECTIVE:  Note: Objective measures were  completed at Evaluation unless otherwise noted.  DIAGNOSTIC FINDINGS: MRI of the brain 03/18/2020 showed multiple T2/FLAIR hyperintense foci in the periventricular, juxtacortical and deep white matter.  There were no infratentorial lesions noted.  None of the foci enhanced.   Due to severe claustrophobia, the study was done with conscious sedation in the hospital   SENSATION: Lack of sensation in feet, loss in bilateral lateral aspect of knee  LOWER EXTREMITY MMT:    MMT Right Eval Left Eval  Hip flexion 2- 2  Hip extension    Hip abduction 3+ 3+  Hip adduction 3 3  Hip internal rotation    Hip external rotation    Knee flexion 2 2+  Knee extension 2- 2  Ankle dorsiflexion 0 0  Ankle plantarflexion 0 0  Ankle inversion    Ankle eversion    (Blank rows = not tested)  TRANSFERS: Assistive device utilized: Environmental consultant - 2 wheeled  Sit to stand: CGA Stand to sit: CGA  FUNCTIONAL TESTS:  5 times sit to stand: unable to complete 2 consecutive sit to stands  10 meter walk test: perform in future session   PATIENT SURVEYS:  FOTO 34  TODAY'S TREATMENT: DATE: 06/07/23 All activities performed with gait belt and CGA unless otherwise noted.   In // bars: Sit to stand no UE support; x multiple attempts able to perform 10 alternating taps to PT shoulder.  Sit to stand ambulate length of // bars with max cueing for weight shift and lifting limb Attempted third stand x 3 attempts.  Partial sit to stand x 4 in a row with focus on LE pressing   Seated: RTB abduction 10x RTB abduction 10x RTB ER 15x RTB D2 pattern 10x RTB overhead raise 10x Slider 10x each LE with occasional PROM assistance.    Pt requires significant therapeutic rest breaks throughout the session due to fatigue.  PATIENT EDUCATION: Education details: Pt educated throughout session about proper posture and technique with  exercises. Improved exercise technique, movement at target joints, use of target muscles after min to mod verbal, visual, tactile cues.  Person educated: Patient Education method: Explanation, Demonstration, Tactile cues, and Verbal cues Education comprehension: verbalized understanding, returned demonstration, verbal cues required, and tactile cues required  HOME EXERCISE PROGRAM:  To be given at next visit.   GOALS: Goals reviewed with patient? Yes  SHORT TERM GOALS: Target date: 03/06/2023  Patient will be independent in home exercise program to improve strength/mobility for better functional independence with ADLs.  Baseline: 10/1: give next session 11/5: HEP compliant Goal status: MET   LONG TERM GOALS: Target date:07/24/2023    Patient will increase FOTO score to equal to or greater than 48 to demonstrate statistically significant improvement in mobility and quality of life  Baseline: 10/1: 34%  11/5: 39.8% 12/12: 42% 1/16: 36%  Goal status: partially met   2.  Patient (> 60 years old) will complete five times sit to stand test in < 15 seconds indicating an increased LE strength and improved balance.  Baseline: 10/1; unable to tolerate two consecutive sit to stands 11/5: 2.19.26 5 partial sit to stands 12/12: 37.68s;One full sit to stand bring UE to walker, four partial STS without UE to walker  1/16: one full sit to stand; four partial STS without UE on walker 28.34  Goal status: Partially Met  3.  Patient will increase 10 meter walk test to >0.5 m/s as to improve gait speed for better community ambulation and to reduce fall  risk.  Baseline: 10/1: not tested this session 11/5: unable to stand from wheelchair fully to start walking. 12/12: not appropriate at this time 12/24: takes 3 steps in previous sessions 1/16: not able to get to full stand this session.  Goal status: Ongoing  4.  Patient will transfer independently at home and no longer require use of Sara Plus Lift  for increased independence.  Baseline: 10/1: reliant upon lift 11/5: reliant upon lift still 12/12: reliant upon lift still  1/16: requires use of sit to stander Goal status: partially met    ASSESSMENT:  CLINICAL IMPRESSION: Patient is highly motivated throughout session. She is able to tolerate progressive stabilization in standing to aide towards standing ADL performance. Patient is able to perform multiple mini sit to stands as well with increased LE activation.  Pt will continue to benefit from skilled therapy to address remaining deficits in order to improve overall QoL and return to PLOF.   OBJECTIVE IMPAIRMENTS: Abnormal gait, decreased activity tolerance, decreased balance, decreased coordination, decreased endurance, decreased mobility, difficulty walking, decreased strength, impaired perceived functional ability, improper body mechanics, and postural dysfunction.   ACTIVITY LIMITATIONS: carrying, lifting, bending, standing, squatting, sleeping, transfers, bed mobility, bathing, toileting, dressing, reach over head, hygiene/grooming, locomotion level, and caring for others  PARTICIPATION LIMITATIONS: meal prep, cleaning, laundry, driving, shopping, community activity, yard work, and church  PERSONAL FACTORS: back pain, CBP, chronic L shoulder pain, galactorrhea, neuropathy, HPV, hypercholestermeia, HTN, MS, osteopenia, PONV, wrist fracture are also affecting patient's functional outcome.   REHAB POTENTIAL: Good  CLINICAL DECISION MAKING: Evolving/moderate complexity  EVALUATION COMPLEXITY: Moderate  PLAN:  PT FREQUENCY: 2x/week  PT DURATION: 12 weeks  PLANNED INTERVENTIONS: Therapeutic exercises, Therapeutic activity, Neuromuscular re-education, Balance training, Gait training, Patient/Family education, Self Care, Joint mobilization, Stair training, Vestibular training, Canalith repositioning, Orthotic/Fit training, DME instructions, Dry Needling, Electrical stimulation,  Spinal mobilization, Cryotherapy, Moist heat, Compression bandaging, scar mobilization, Taping, Traction, Ultrasound, Manual therapy, and Re-evaluation  PLAN FOR NEXT SESSION:  Gait training, LE strengthening, HEP as appropriate   11:00 AM, 06/07/23 Precious Bard, PT, DPT Physical Therapist - [place] [facility]  Outpatient Physical Therapy- Main Campus [phone]

## 2023-06-07 ENCOUNTER — Ambulatory Visit: Payer: PPO

## 2023-06-07 DIAGNOSIS — R2681 Unsteadiness on feet: Secondary | ICD-10-CM

## 2023-06-07 DIAGNOSIS — M6281 Muscle weakness (generalized): Secondary | ICD-10-CM | POA: Diagnosis not present

## 2023-06-07 DIAGNOSIS — R2689 Other abnormalities of gait and mobility: Secondary | ICD-10-CM

## 2023-06-07 DIAGNOSIS — R262 Difficulty in walking, not elsewhere classified: Secondary | ICD-10-CM

## 2023-06-11 NOTE — Therapy (Signed)
 OUTPATIENT PHYSICAL THERAPY NEURO TREATMENT      Patient Name: Lisa Williams MRN: [id number] DOB:December 05, 1960, 63 y.o., female Today's Date: 06/12/2023   PCP: Lenon Layman ORN MD REFERRING PROVIDER: Lenon Layman ORN MD  END OF SESSION:  PT End of Session - 06/12/23 1012     Visit Number 34    Number of Visits 47    Date for PT Re-Evaluation 07/24/23    PT Start Time 1014    PT Stop Time 1059    PT Time Calculation (min) 45 min    Equipment Utilized During Treatment Gait belt    Activity Tolerance Patient tolerated treatment well    Behavior During Therapy [facility] for tasks assessed/performed                               Past Medical History:  Diagnosis Date   Abdominal pain, right upper quadrant    Back pain    Calculus of kidney 12/09/2013   Chronic back pain    unspecified   Chronic left shoulder pain 07/19/2015   Complication of anesthesia    Functional disorder of bladder    other   Galactorrhea 11/26/2014   Chronic    Hereditary and idiopathic neuropathy 08/19/2013   History of kidney stones    HPV test positive    Hypercholesteremia 08/19/2013   Hypertension    Incomplete bladder emptying    Microscopic hematuria    MS (multiple sclerosis) (HCC)    Muscle spasticity 05/21/2014   Nonspecific findings on examination of urine    other   Osteopenia    PONV (postoperative nausea and vomiting)    Status post laparoscopic supracervical hysterectomy 11/26/2014   Tobacco user 11/26/2014   Wrist fracture    Past Surgical History:  Procedure Laterality Date   bilateral tubal ligation  1996   BREAST CYST EXCISION Left 2002   CYST EXCISION Left 05/10/2022   Procedure: CYST REMOVAL;  Surgeon: Rodolph Romano, MD;  Location: [facility] ORS;  Service: General;  Laterality: Left;   FRACTURE SURGERY     KNEE SURGERY     right   LAPAROSCOPIC SUPRACERVICAL HYSTERECTOMY  08/05/2013   ORIF WRIST FRACTURE Left 01/17/2017   Procedure:  OPEN REDUCTION INTERNAL FIXATION (ORIF) WRIST FRACTURE;  Surgeon: Leora Lynwood SAUNDERS, MD;  Location: [facility] ORS;  Service: Orthopedics;  Laterality: Left;   RADIOLOGY WITH ANESTHESIA N/A 03/18/2020   Procedure: MRI WITH ANESTHESIA CERVICAL SPINE AND BRAIN  WITH AND WITHOUT CONTRAST;  Surgeon: Radiologist, Medication, MD;  Location: [facility] OR;  Service: Radiology;  Laterality: N/A;   TUBAL LIGATION Bilateral    VAGINAL HYSTERECTOMY  03/2006   Patient Active Problem List   Diagnosis Date Noted   Hypokalemia 07/29/2022   Weakness of both lower extremities 07/29/2022   Abscess of left groin 11/15/2021   Abnormal LFTs 11/15/2021   Acute respiratory disease due to COVID-19 virus 11/21/2020   Weakness    Hypoalbuminemia due to protein-calorie malnutrition [facility])    Neurogenic bowel    Neurogenic bladder    Labile blood pressure    Neuropathic pain    Abscess of female pelvis    SVT (supraventricular tachycardia) (HCC)    Radial styloid tenosynovitis 03/12/2018   Wheelchair dependence 02/27/2018   Localized osteoporosis with current pathological fracture with routine healing 01/19/2017   Wrist fracture 01/16/2017   Sprain of ankle 03/23/2016   Closed fracture of lateral malleolus 03/16/2016  Health care maintenance 01/24/2016   Blood pressure elevated without history of HTN 10/25/2015   Essential hypertension 10/25/2015   Multiple sclerosis (HCC) 10/02/2015   Chronic left shoulder pain 07/19/2015   Multiple sclerosis exacerbation (HCC) 07/14/2015   MS (multiple sclerosis) (HCC) 11/26/2014   Increased body mass index 11/26/2014   HPV test positive 11/26/2014   Status post laparoscopic supracervical hysterectomy 11/26/2014   Galactorrhea 11/26/2014   Back ache 05/21/2014   Adiposity 05/21/2014   Disordered sleep 05/21/2014   Muscle spasticity 05/21/2014   Spasticity 05/21/2014   Calculus of kidney 12/09/2013   Renal colic 12/09/2013   Hypercholesteremia 08/19/2013   Hereditary and  idiopathic neuropathy 08/19/2013   Hypercholesterolemia without hypertriglyceridemia 08/19/2013   Bladder infection, chronic 07/25/2012   Disorder of bladder function 07/25/2012   Incomplete bladder emptying 07/25/2012   Microscopic hematuria 07/25/2012   Right upper quadrant pain 07/25/2012    ONSET DATE: 1995  REFERRING DIAG: MS, weakness, other closed fracture of proximal end of R tibia  THERAPY DIAG:  Muscle weakness (generalized)  Difficulty in walking, not elsewhere classified  Unsteadiness on feet  Other abnormalities of gait and mobility  Rationale for Evaluation and Treatment: Rehabilitation  SUBJECTIVE:                                                                                                                                                                                             SUBJECTIVE STATEMENT: Patient reports feeling good.    Pt accompanied by: self  PERTINENT HISTORY: Patient is returning to PT s/p ORIF of R tibia shaft fracture 09/23/2022. Patient has weakness in BLE with RLE>LLE. She drives with hand controls. Patient has been diagnosed with MS in 1995. PMH includes: back pain, CBP, chronic L shoulder pain, galactorrhea, neuropathy, HPV, hypercholestermeia, HTN, MS, osteopenia, PONV, wrist fracture. Additional order for other closed fracture of proximal end of R tibia with routine healing. Still has to use [employer].   PAIN:  Are you having pain? No  PRECAUTIONS: Fall  RED FLAGS: None   WEIGHT BEARING RESTRICTIONS: No  FALLS: Has patient fallen in last 6 months? Yes. Number of falls 1  LIVING ENVIRONMENT: Lives with: lives with their family Lives in: House/apartment Stairs:  ramp from garage into house Has following equipment at home: Vannie - 2 wheeled, shower chair, Grab bars, and Ramped entry  PLOF: Independent with household mobility with device  PATIENT GOALS: get up and pivot and turn, sit to stands. Return to walk, go to  bathroom on her own   OBJECTIVE:  Note: Objective measures were completed at Evaluation unless otherwise noted.  DIAGNOSTIC FINDINGS: MRI of the brain 03/18/2020 showed multiple T2/FLAIR hyperintense foci in the periventricular, juxtacortical and deep white matter.  There were no infratentorial lesions noted.  None of the foci enhanced.   Due to severe claustrophobia, the study was done with conscious sedation in the hospital   SENSATION: Lack of sensation in feet, loss in bilateral lateral aspect of knee  LOWER EXTREMITY MMT:    MMT Right Eval Left Eval  Hip flexion 2- 2  Hip extension    Hip abduction 3+ 3+  Hip adduction 3 3  Hip internal rotation    Hip external rotation    Knee flexion 2 2+  Knee extension 2- 2  Ankle dorsiflexion 0 0  Ankle plantarflexion 0 0  Ankle inversion    Ankle eversion    (Blank rows = not tested)  TRANSFERS: Assistive device utilized: [employer]ant - 2 wheeled  Sit to stand: CGA Stand to sit: CGA  FUNCTIONAL TESTS:  5 times sit to stand: unable to complete 2 consecutive sit to stands  10 meter walk test: perform in future session   PATIENT SURVEYS:  FOTO 34  TODAY'S TREATMENT: DATE: 06/12/23 All activities performed with gait belt and CGA unless otherwise noted.   In // bars: Sit to stand no UE support; x multiple attempts able to perform 10 alternating taps to PT shoulder. Last stand no UE support 12 seconds no UE support  Sit to stand ambulate length of // bars with max cueing for weight shift and lifting limb, one rest break Attempted third stand x 3 attempts.     Seated: RTB abduction 10x RTB abduction 10x Slider 10x each LE with occasional PROM assistance.   Seated boxing: cues for sequencing, body mechanics, and pace/rhythm for functional contraction and timing of muscle recruitment: -Cross body punches to mitts on PT hands with no back support for focused core stabilization with crossing midline 2x 60 seconds  -Cross body  punch with secondary combination of elbow for full cross body rotation to PT mitt for trunk stability, coordination, and cardiovascular challenge x 60 seconds -Upper cut to mitts in PT hands for core activation without back support with perturbations 60 seconds  Pt requires significant therapeutic rest breaks throughout the session due to fatigue.  PATIENT EDUCATION: Education details: Pt educated throughout session about proper posture and technique with exercises. Improved exercise technique, movement at target joints, use of target muscles after min to mod verbal, visual, tactile cues.  Person educated: Patient Education method: Explanation, Demonstration, Tactile cues, and Verbal cues Education comprehension: verbalized understanding, returned demonstration, verbal cues required, and tactile cues required  HOME EXERCISE PROGRAM:  To be given at next visit.   GOALS: Goals reviewed with patient? Yes  SHORT TERM GOALS: Target date: 03/06/2023  Patient will be independent in home exercise program to improve strength/mobility for better functional independence with ADLs.  Baseline: 10/1: give next session 11/5: HEP compliant Goal status: MET   LONG TERM GOALS: Target date:07/24/2023    Patient will increase FOTO score to equal to or greater than 48 to demonstrate statistically significant improvement in mobility and quality of life  Baseline: 10/1: 34%  11/5: 39.8% 12/12: 42% 1/16: 36%  Goal status: partially met   2.  Patient (> 41 years old) will complete five times sit to stand test in < 15 seconds indicating an increased LE strength and improved balance.  Baseline: 10/1; unable to tolerate two consecutive sit to stands 11/5: 2.19.26 5 partial sit  to stands 12/12: 37.68s;One full sit to stand bring UE to walker, four partial STS without UE to walker  1/16: one full sit to stand; four partial STS without UE on walker 28.34  Goal status: Partially Met  3.  Patient will increase  10 meter walk test to >0.5 m/s as to improve gait speed for better community ambulation and to reduce fall risk.  Baseline: 10/1: not tested this session 11/5: unable to stand from wheelchair fully to start walking. 12/12: not appropriate at this time 12/24: takes 3 steps in previous sessions 1/16: not able to get to full stand this session.  Goal status: Ongoing  4.  Patient will transfer independently at home and no longer require use of Sara Plus Lift for increased independence.  Baseline: 10/1: reliant upon lift 11/5: reliant upon lift still 12/12: reliant upon lift still  1/16: requires use of sit to stander Goal status: partially met    ASSESSMENT:  CLINICAL IMPRESSION:  Patient performs increased static standing with decreasing UE support at beginning of session. Due to this she is unable to ambulate as well following intervention due to fatigue.  Pt will continue to benefit from skilled therapy to address remaining deficits in order to improve overall QoL and return to PLOF.   OBJECTIVE IMPAIRMENTS: Abnormal gait, decreased activity tolerance, decreased balance, decreased coordination, decreased endurance, decreased mobility, difficulty walking, decreased strength, impaired perceived functional ability, improper body mechanics, and postural dysfunction.   ACTIVITY LIMITATIONS: carrying, lifting, bending, standing, squatting, sleeping, transfers, bed mobility, bathing, toileting, dressing, reach over head, hygiene/grooming, locomotion level, and caring for others  PARTICIPATION LIMITATIONS: meal prep, cleaning, laundry, driving, shopping, community activity, yard work, and church  PERSONAL FACTORS: back pain, CBP, chronic L shoulder pain, galactorrhea, neuropathy, HPV, hypercholestermeia, HTN, MS, osteopenia, PONV, wrist fracture are also affecting patient's functional outcome.   REHAB POTENTIAL: Good  CLINICAL DECISION MAKING: Evolving/moderate complexity  EVALUATION COMPLEXITY:  Moderate  PLAN:  PT FREQUENCY: 2x/week  PT DURATION: 12 weeks  PLANNED INTERVENTIONS: Therapeutic exercises, Therapeutic activity, Neuromuscular re-education, Balance training, Gait training, Patient/Family education, Self Care, Joint mobilization, Stair training, Vestibular training, Canalith repositioning, Orthotic/Fit training, DME instructions, Dry Needling, Electrical stimulation, Spinal mobilization, Cryotherapy, Moist heat, Compression bandaging, scar mobilization, Taping, Traction, Ultrasound, Manual therapy, and Re-evaluation  PLAN FOR NEXT SESSION:  Gait training, LE strengthening, HEP as appropriate   10:59 AM, 06/12/23 Rane Blitch  Leopoldo, PT, DPT Physical Therapist - [place] [facility]  Outpatient Physical Therapy- Main Campus [phone]

## 2023-06-12 ENCOUNTER — Ambulatory Visit: Payer: PPO | Attending: Neurology

## 2023-06-12 DIAGNOSIS — R2689 Other abnormalities of gait and mobility: Secondary | ICD-10-CM | POA: Diagnosis not present

## 2023-06-12 DIAGNOSIS — M6281 Muscle weakness (generalized): Secondary | ICD-10-CM | POA: Insufficient documentation

## 2023-06-12 DIAGNOSIS — R2681 Unsteadiness on feet: Secondary | ICD-10-CM | POA: Diagnosis not present

## 2023-06-12 DIAGNOSIS — R262 Difficulty in walking, not elsewhere classified: Secondary | ICD-10-CM | POA: Insufficient documentation

## 2023-06-13 NOTE — Therapy (Signed)
 OUTPATIENT PHYSICAL THERAPY NEURO TREATMENT      Patient Name: Lisa Williams MRN: [id number] DOB:April 15, 1961, 63 y.o., female Today's Date: 06/14/2023   PCP: Lenon Layman ORN MD REFERRING PROVIDER: Lenon Layman ORN MD  END OF SESSION:  PT End of Session - 06/14/23 1018     Visit Number 35    Number of Visits 47    Date for PT Re-Evaluation 07/24/23    PT Start Time 1015    PT Stop Time 1059    PT Time Calculation (min) 44 min    Equipment Utilized During Treatment Gait belt    Activity Tolerance Patient tolerated treatment well    Behavior During Therapy [facility] for tasks assessed/performed                                Past Medical History:  Diagnosis Date   Abdominal pain, right upper quadrant    Back pain    Calculus of kidney 12/09/2013   Chronic back pain    unspecified   Chronic left shoulder pain 07/19/2015   Complication of anesthesia    Functional disorder of bladder    other   Galactorrhea 11/26/2014   Chronic    Hereditary and idiopathic neuropathy 08/19/2013   History of kidney stones    HPV test positive    Hypercholesteremia 08/19/2013   Hypertension    Incomplete bladder emptying    Microscopic hematuria    MS (multiple sclerosis) (HCC)    Muscle spasticity 05/21/2014   Nonspecific findings on examination of urine    other   Osteopenia    PONV (postoperative nausea and vomiting)    Status post laparoscopic supracervical hysterectomy 11/26/2014   Tobacco user 11/26/2014   Wrist fracture    Past Surgical History:  Procedure Laterality Date   bilateral tubal ligation  1996   BREAST CYST EXCISION Left 2002   CYST EXCISION Left 05/10/2022   Procedure: CYST REMOVAL;  Surgeon: Rodolph Romano, MD;  Location: [facility] ORS;  Service: General;  Laterality: Left;   FRACTURE SURGERY     KNEE SURGERY     right   LAPAROSCOPIC SUPRACERVICAL HYSTERECTOMY  08/05/2013   ORIF WRIST FRACTURE Left 01/17/2017    Procedure: OPEN REDUCTION INTERNAL FIXATION (ORIF) WRIST FRACTURE;  Surgeon: Leora Lynwood SAUNDERS, MD;  Location: [facility] ORS;  Service: Orthopedics;  Laterality: Left;   RADIOLOGY WITH ANESTHESIA N/A 03/18/2020   Procedure: MRI WITH ANESTHESIA CERVICAL SPINE AND BRAIN  WITH AND WITHOUT CONTRAST;  Surgeon: Radiologist, Medication, MD;  Location: [facility] OR;  Service: Radiology;  Laterality: N/A;   TUBAL LIGATION Bilateral    VAGINAL HYSTERECTOMY  03/2006   Patient Active Problem List   Diagnosis Date Noted   Hypokalemia 07/29/2022   Weakness of both lower extremities 07/29/2022   Abscess of left groin 11/15/2021   Abnormal LFTs 11/15/2021   Acute respiratory disease due to COVID-19 virus 11/21/2020   Weakness    Hypoalbuminemia due to protein-calorie malnutrition [facility])    Neurogenic bowel    Neurogenic bladder    Labile blood pressure    Neuropathic pain    Abscess of female pelvis    SVT (supraventricular tachycardia) (HCC)    Radial styloid tenosynovitis 03/12/2018   Wheelchair dependence 02/27/2018   Localized osteoporosis with current pathological fracture with routine healing 01/19/2017   Wrist fracture 01/16/2017   Sprain of ankle 03/23/2016   Closed fracture of lateral malleolus 03/16/2016  Health care maintenance 01/24/2016   Blood pressure elevated without history of HTN 10/25/2015   Essential hypertension 10/25/2015   Multiple sclerosis (HCC) 10/02/2015   Chronic left shoulder pain 07/19/2015   Multiple sclerosis exacerbation (HCC) 07/14/2015   MS (multiple sclerosis) (HCC) 11/26/2014   Increased body mass index 11/26/2014   HPV test positive 11/26/2014   Status post laparoscopic supracervical hysterectomy 11/26/2014   Galactorrhea 11/26/2014   Back ache 05/21/2014   Adiposity 05/21/2014   Disordered sleep 05/21/2014   Muscle spasticity 05/21/2014   Spasticity 05/21/2014   Calculus of kidney 12/09/2013   Renal colic 12/09/2013   Hypercholesteremia 08/19/2013   Hereditary  and idiopathic neuropathy 08/19/2013   Hypercholesterolemia without hypertriglyceridemia 08/19/2013   Bladder infection, chronic 07/25/2012   Disorder of bladder function 07/25/2012   Incomplete bladder emptying 07/25/2012   Microscopic hematuria 07/25/2012   Right upper quadrant pain 07/25/2012    ONSET DATE: 1995  REFERRING DIAG: MS, weakness, other closed fracture of proximal end of R tibia  THERAPY DIAG:  Muscle weakness (generalized)  Difficulty in walking, not elsewhere classified  Unsteadiness on feet  Other abnormalities of gait and mobility  Rationale for Evaluation and Treatment: Rehabilitation  SUBJECTIVE:                                                                                                                                                                                             SUBJECTIVE STATEMENT: Patient presents feeling off today    Pt accompanied by: self  PERTINENT HISTORY: Patient is returning to PT s/p ORIF of R tibia shaft fracture 09/23/2022. Patient has weakness in BLE with RLE>LLE. She drives with hand controls. Patient has been diagnosed with MS in 1995. PMH includes: back pain, CBP, chronic L shoulder pain, galactorrhea, neuropathy, HPV, hypercholestermeia, HTN, MS, osteopenia, PONV, wrist fracture. Additional order for other closed fracture of proximal end of R tibia with routine healing. Still has to use [employer].   PAIN:  Are you having pain? No  PRECAUTIONS: Fall  RED FLAGS: None   WEIGHT BEARING RESTRICTIONS: No  FALLS: Has patient fallen in last 6 months? Yes. Number of falls 1  LIVING ENVIRONMENT: Lives with: lives with their family Lives in: House/apartment Stairs:  ramp from garage into house Has following equipment at home: Vannie - 2 wheeled, shower chair, Grab bars, and Ramped entry  PLOF: Independent with household mobility with device  PATIENT GOALS: get up and pivot and turn, sit to stands. Return to walk, go  to bathroom on her own   OBJECTIVE:  Note: Objective measures were completed at Evaluation unless otherwise  noted.  DIAGNOSTIC FINDINGS: MRI of the brain 03/18/2020 showed multiple T2/FLAIR hyperintense foci in the periventricular, juxtacortical and deep white matter.  There were no infratentorial lesions noted.  None of the foci enhanced.   Due to severe claustrophobia, the study was done with conscious sedation in the hospital   SENSATION: Lack of sensation in feet, loss in bilateral lateral aspect of knee  LOWER EXTREMITY MMT:    MMT Right Eval Left Eval  Hip flexion 2- 2  Hip extension    Hip abduction 3+ 3+  Hip adduction 3 3  Hip internal rotation    Hip external rotation    Knee flexion 2 2+  Knee extension 2- 2  Ankle dorsiflexion 0 0  Ankle plantarflexion 0 0  Ankle inversion    Ankle eversion    (Blank rows = not tested)  TRANSFERS: Assistive device utilized: Environmental Consultant - 2 wheeled  Sit to stand: CGA Stand to sit: CGA  FUNCTIONAL TESTS:  5 times sit to stand: unable to complete 2 consecutive sit to stands  10 meter walk test: perform in future session   PATIENT SURVEYS:  FOTO 34  TODAY'S TREATMENT: DATE: 06/14/23 All activities performed with gait belt and CGA unless otherwise noted.   In // bars: Sit to stand x1  Sit to stand attempt at ambulation unsuccessful  Attempted third stand -unsuccessful    Seated: RTB abduction 10x RTB abduction 10x GTB D2 pattern 10x  GTB row 15x; 2 sets GTB ER 15x  Slider 10x each LE with occasional PROM assistance.   Education on energy conservation techniques   Pt requires significant therapeutic rest breaks throughout the session due to fatigue.  PATIENT EDUCATION: Education details: Pt educated throughout session about proper posture and technique with exercises. Improved exercise technique, movement at target joints, use of target muscles after min to mod verbal, visual, tactile cues.  Person educated:  Patient Education method: Explanation, Demonstration, Tactile cues, and Verbal cues Education comprehension: verbalized understanding, returned demonstration, verbal cues required, and tactile cues required  HOME EXERCISE PROGRAM:  To be given at next visit.   GOALS: Goals reviewed with patient? Yes  SHORT TERM GOALS: Target date: 03/06/2023  Patient will be independent in home exercise program to improve strength/mobility for better functional independence with ADLs.  Baseline: 10/1: give next session 11/5: HEP compliant Goal status: MET   LONG TERM GOALS: Target date:07/24/2023    Patient will increase FOTO score to equal to or greater than 48 to demonstrate statistically significant improvement in mobility and quality of life  Baseline: 10/1: 34%  11/5: 39.8% 12/12: 42% 1/16: 36%  Goal status: partially met   2.  Patient (> 48 years old) will complete five times sit to stand test in < 15 seconds indicating an increased LE strength and improved balance.  Baseline: 10/1; unable to tolerate two consecutive sit to stands 11/5: 2.19.26 5 partial sit to stands 12/12: 37.68s;One full sit to stand bring UE to walker, four partial STS without UE to walker  1/16: one full sit to stand; four partial STS without UE on walker 28.34  Goal status: Partially Met  3.  Patient will increase 10 meter walk test to >0.5 m/s as to improve gait speed for better community ambulation and to reduce fall risk.  Baseline: 10/1: not tested this session 11/5: unable to stand from wheelchair fully to start walking. 12/12: not appropriate at this time 12/24: takes 3 steps in previous sessions 1/16: not able to  get to full stand this session.  Goal status: Ongoing  4.  Patient will transfer independently at home and no longer require use of Sara Plus Lift for increased independence.  Baseline: 10/1: reliant upon lift 11/5: reliant upon lift still 12/12: reliant upon lift still  1/16: requires use of sit to  stander Goal status: partially met    ASSESSMENT:  CLINICAL IMPRESSION: Patient is highly motivated throughout session despite having difficulty with LE activation techniques. She is challenged with standing today and requires extensive education on energy conservation.  Pt will continue to benefit from skilled therapy to address remaining deficits in order to improve overall QoL and return to PLOF.   OBJECTIVE IMPAIRMENTS: Abnormal gait, decreased activity tolerance, decreased balance, decreased coordination, decreased endurance, decreased mobility, difficulty walking, decreased strength, impaired perceived functional ability, improper body mechanics, and postural dysfunction.   ACTIVITY LIMITATIONS: carrying, lifting, bending, standing, squatting, sleeping, transfers, bed mobility, bathing, toileting, dressing, reach over head, hygiene/grooming, locomotion level, and caring for others  PARTICIPATION LIMITATIONS: meal prep, cleaning, laundry, driving, shopping, community activity, yard work, and church  PERSONAL FACTORS: back pain, CBP, chronic L shoulder pain, galactorrhea, neuropathy, HPV, hypercholestermeia, HTN, MS, osteopenia, PONV, wrist fracture are also affecting patient's functional outcome.   REHAB POTENTIAL: Good  CLINICAL DECISION MAKING: Evolving/moderate complexity  EVALUATION COMPLEXITY: Moderate  PLAN:  PT FREQUENCY: 2x/week  PT DURATION: 12 weeks  PLANNED INTERVENTIONS: Therapeutic exercises, Therapeutic activity, Neuromuscular re-education, Balance training, Gait training, Patient/Family education, Self Care, Joint mobilization, Stair training, Vestibular training, Canalith repositioning, Orthotic/Fit training, DME instructions, Dry Needling, Electrical stimulation, Spinal mobilization, Cryotherapy, Moist heat, Compression bandaging, scar mobilization, Taping, Traction, Ultrasound, Manual therapy, and Re-evaluation  PLAN FOR NEXT SESSION:  Gait training, LE  strengthening, HEP as appropriate   11:10 AM, 06/14/23 Jacion Dismore  Leopoldo, PT, DPT Physical Therapist - [place] [facility]  Outpatient Physical Therapy- Main Campus [phone]

## 2023-06-14 ENCOUNTER — Ambulatory Visit: Payer: PPO

## 2023-06-14 DIAGNOSIS — M6281 Muscle weakness (generalized): Secondary | ICD-10-CM

## 2023-06-14 DIAGNOSIS — R2689 Other abnormalities of gait and mobility: Secondary | ICD-10-CM

## 2023-06-14 DIAGNOSIS — R2681 Unsteadiness on feet: Secondary | ICD-10-CM

## 2023-06-14 DIAGNOSIS — R262 Difficulty in walking, not elsewhere classified: Secondary | ICD-10-CM

## 2023-06-19 ENCOUNTER — Ambulatory Visit: Payer: PPO

## 2023-06-19 DIAGNOSIS — R262 Difficulty in walking, not elsewhere classified: Secondary | ICD-10-CM

## 2023-06-19 DIAGNOSIS — M6281 Muscle weakness (generalized): Secondary | ICD-10-CM

## 2023-06-19 DIAGNOSIS — R2689 Other abnormalities of gait and mobility: Secondary | ICD-10-CM

## 2023-06-19 DIAGNOSIS — R2681 Unsteadiness on feet: Secondary | ICD-10-CM

## 2023-06-19 NOTE — Therapy (Signed)
OUTPATIENT PHYSICAL THERAPY TREATMENT    Patient Name: Lisa Williams MRN: [id number] DOB:1960-06-16, 63 y.o., female Today's Date: 06/19/2023  PCP: Lauro Regulus MD REFERRING PROVIDER: Lauro Regulus MD  END OF SESSION:  PT End of Session - 06/19/23 1056     Visit Number 36    Number of Visits 47    Date for PT Re-Evaluation 07/24/23    Authorization Type HTA primary; Medicaid secondary    Authorization Time Period 02/06/23-05/01/23    Progress Note Due on Visit 40    PT Start Time 1015    PT Stop Time 1055    PT Time Calculation (min) 40 min    Equipment Utilized During Treatment Gait belt    Activity Tolerance Patient tolerated treatment well;No increased pain    Behavior During Therapy [facility] for tasks assessed/performed             Past Medical History:  Diagnosis Date   Abdominal pain, right upper quadrant    Back pain    Calculus of kidney 12/09/2013   Chronic back pain    unspecified   Chronic left shoulder pain 07/19/2015   Complication of anesthesia    Functional disorder of bladder    other   Galactorrhea 11/26/2014   Chronic    Hereditary and idiopathic neuropathy 08/19/2013   History of kidney stones    HPV test positive    Hypercholesteremia 08/19/2013   Hypertension    Incomplete bladder emptying    Microscopic hematuria    MS (multiple sclerosis) (HCC)    Muscle spasticity 05/21/2014   Nonspecific findings on examination of urine    other   Osteopenia    PONV (postoperative nausea and vomiting)    Status post laparoscopic supracervical hysterectomy 11/26/2014   Tobacco user 11/26/2014   Wrist fracture    Past Surgical History:  Procedure Laterality Date   bilateral tubal ligation  1996   BREAST CYST EXCISION Left 2002   CYST EXCISION Left 05/10/2022   Procedure: CYST REMOVAL;  Surgeon: Carolan Shiver, MD;  Location: [facility] ORS;  Service: General;  Laterality: Left;   FRACTURE SURGERY     KNEE SURGERY     right    LAPAROSCOPIC SUPRACERVICAL HYSTERECTOMY  08/05/2013   ORIF WRIST FRACTURE Left 01/17/2017   Procedure: OPEN REDUCTION INTERNAL FIXATION (ORIF) WRIST FRACTURE;  Surgeon: Lyndle Herrlich, MD;  Location: [facility] OR[facility];  Service: Orthopedics;  Laterality: Left;   RADIOLOGY WITH ANESTHESIA N/A 03/18/2020   Procedure: MRI WITH ANESTHESIA CERVICAL SPINE AND BRAIN  WITH AND WITHOUT CONTRAST;  Surgeon: Radiologist, Medication, MD;  Location: [facility] OR;  Service: Radiology;  Laterality: N/A;   TUBAL LIGATION Bilateral    VAGINAL HYSTERECTOMY  03/2006   Patient Active Problem List   Diagnosis Date Noted   Hypokalemia 07/29/2022   Weakness of both lower extremities 07/29/2022   Abscess of left groin 11/15/2021   Abnormal LFTs 11/15/2021   Acute respiratory disease due to COVID-19 virus 11/21/2020   Weakness    Hypoalbuminemia due to protein-calorie malnutrition [facility])    Neurogenic bowel    Neurogenic bladder    Labile blood pressure    Neuropathic pain    Abscess of female pelvis    SVT (supraventricular tachycardia) (HCC)    Radial styloid tenosynovitis 03/12/2018   Wheelchair dependence 02/27/2018   Localized osteoporosis with current pathological fracture with routine healing 01/19/2017   Wrist fracture 01/16/2017   Sprain of ankle 03/23/2016   Closed fracture  of lateral malleolus 03/16/2016   Health care maintenance 01/24/2016   Blood pressure elevated without history of HTN 10/25/2015   Essential hypertension 10/25/2015   Multiple sclerosis (HCC) 10/02/2015   Chronic left shoulder pain 07/19/2015   Multiple sclerosis exacerbation (HCC) 07/14/2015   MS (multiple sclerosis) (HCC) 11/26/2014   Increased body mass index 11/26/2014   HPV test positive 11/26/2014   Status post laparoscopic supracervical hysterectomy 11/26/2014   Galactorrhea 11/26/2014   Back ache 05/21/2014   Adiposity 05/21/2014   Disordered sleep 05/21/2014   Muscle spasticity 05/21/2014   Spasticity 05/21/2014   Calculus  of kidney 12/09/2013   Renal colic 12/09/2013   Hypercholesteremia 08/19/2013   Hereditary and idiopathic neuropathy 08/19/2013   Hypercholesterolemia without hypertriglyceridemia 08/19/2013   Bladder infection, chronic 07/25/2012   Disorder of bladder function 07/25/2012   Incomplete bladder emptying 07/25/2012   Microscopic hematuria 07/25/2012   Right upper quadrant pain 07/25/2012    ONSET DATE: 1995  REFERRING DIAG: MS, weakness, other closed fracture of proximal end of R tibia  THERAPY DIAG:  Muscle weakness (generalized)  Difficulty in walking, not elsewhere classified  Unsteadiness on feet  Other abnormalities of gait and mobility  Rationale for Evaluation and Treatment: Rehabilitation  SUBJECTIVE:                                                                                                                                                                                             SUBJECTIVE STATEMENT: Pt reports feeling improved since last session, no significant medical updates since last session.   Pt accompanied by: self  PERTINENT HISTORY: 62yoF referred to OPPT s/p ORIF of R tibia shaft fracture 09/23/2022. Patient has weakness in BLE with RLE>LLE. She drives with hand controls. Patient has been diagnosed with MS in 1995. PMH includes: back pain, CBP, chronic L shoulder pain, galactorrhea, neuropathy, HPV, hypercholestermeia, HTN, MS, osteopenia, PONV, wrist fracture. Additional order for other closed fracture of proximal end of R tibia with routine healing. 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   PAIN:  Are you having pain? No  PRECAUTIONS: Fall  RED FLAGS: None   WEIGHT BEARING RESTRICTIONS: No  FALLS: Has patient fallen in last 6 months? Yes. Number of falls 1  LIVING ENVIRONMENT: Lives with: lives with their family Lives in: House/apartment Stairs:  ramp from garage into house Has following equipment at home: Dan Humphreys - 2 wheeled, shower chair, Grab bars,  and Ramped entry  PLOF: Independent with household mobility with device  PATIENT GOALS: get up and pivot and turn, sit to stands. Return to walk, go to bathroom on her own  OBJECTIVE:  Note: Objective measures were completed at Evaluation unless otherwise noted.  DIAGNOSTIC FINDINGS: MRI of the brain 03/18/2020 showed multiple T2/FLAIR hyperintense foci in the periventricular, juxtacortical and deep white matter.  There were no infratentorial lesions noted.  None of the foci enhanced.   Due to severe claustrophobia, the study was done with conscious sedation in the hospital   SENSATION: Lack of sensation in feet, loss in bilateral lateral aspect of knee  LOWER EXTREMITY MMT:    MMT Right Eval Left Eval  Hip flexion 2- 2  Hip abduction 3+ 3+  Hip adduction 3 3  Knee flexion 2 2+  Knee extension 2- 2  Ankle dorsiflexion 0 0  Ankle plantarflexion 0 0  (Blank rows = not tested)  TRANSFERS: Assistive device utilized: Environmental consultant - 2 wheeled  Sit to stand: CGA Stand to sit: CGA  FUNCTIONAL TESTS:  5 times sit to stand: unable to complete 2 consecutive sit to stands  10 meter walk test: perform in future session   PATIENT SURVEYS:  FOTO 34  TODAY'S TREATMENT: DATE: 06/19/23  Seated in WC pulled into // bars:  -STS attempts x4 -gait belt in place, heavy BUE use of // bars, minGuard of anterior tibiae to prevent excessive anterior excursion, good clearance of buttocks of seat cushion, unable to transition COM over BOS fully, unable to complete transition from Left WC arm rests to // Bar - No physical assist needed for return to chair;  -minA at end to help with posterior scoot hips into seat  Seated in WC -RTB loop hip horizontal abduction 1x15 -GTB BUE row 1x15 -GTB BUE PNF diagonal pattern 2: 15x each side (stabilizes band c CL arm)   -GTB GHJ ER 1x15  -Seated single leg horizontal adduction redTB loop anchored by author 1x15 each  -RTB loop hip horizontal abduction  1x15 -GTB row 1x15 -seated heel slides in sagittal plane on glider disc: 1x15 modA of limb  high intensity intervals with boxing gloves seated Alternating BUE hooks 1x30 Alternating BUE hook + duck 1x20 [employer] [place]- Sok Tat alteranting sides x30 (excellent trunk rotation ROM)  Alteranting uppercut x30 *brief recovery interval between each   PATIENT EDUCATION: Education details: Pt educated throughout session about proper posture and technique with exercises. Improved exercise technique, movement at target joints, use of target muscles after min to mod verbal, visual, tactile cues.  Person educated: Patient Education method: Explanation, Demonstration, Tactile cues, and Verbal cues Education comprehension: verbalized understanding, returned demonstration, verbal cues required, and tactile cues required  HOME EXERCISE PROGRAM: None given   GOALS: Goals reviewed with patient? Yes  SHORT TERM GOALS: Target date: 03/06/2023  Patient will be independent in home exercise program to improve strength/mobility for better functional independence with ADLs.  Baseline: 10/1: give next session 11/5: HEP compliant Goal status: MET  LONG TERM GOALS: Target date:07/24/2023  Patient will increase FOTO score to equal to or greater than 48 to demonstrate statistically significant improvement in mobility and quality of life  Baseline: 10/1: 34%  11/5: 39.8% 12/12: 42% 1/16: 36% Goal status: partially met   2.  Patient (> 29 years old) will complete five times sit to stand test in < 15 seconds indicating an increased LE strength and improved balance.  Baseline: 10/1; unable to tolerate two consecutive sit to stands 11/5: 2.19.26 5 partial sit to stands 12/12: 37.68s;One full sit to stand bring UE to walker, four partial STS without UE to walker  1/16: one full sit  to stand; four partial STS without UE on walker 28.34  Goal status: Partially Met  3.  Patient will increase 10 meter walk test to >0.5  m/s as to improve gait speed for better community ambulation and to reduce fall risk.  Baseline: 10/1: not tested this session 11/5: unable to stand from wheelchair fully to start walking. 12/12: not appropriate at this time 12/24: takes 3 steps in previous sessions 1/16: not able to get to full stand this session.  Goal status: Ongoing  4.  Patient will transfer independently at home and no longer require use of Sara Plus Lift for increased independence.  Baseline: 10/1: reliant upon lift 11/5: reliant upon lift still 12/12: reliant upon lift still  1/16: requires use of sit to stander Goal status: partially met    ASSESSMENT:  CLINICAL IMPRESSION: Patient is highly motivated throughout session despite difficulty. Pt takes recovery intervals as needed, particularly between high intensity activities.  Pt will continue to benefit from skilled therapy to address deficits/impairment in order to improve overall QOL and maximize independence with ADL/IADL.  OBJECTIVE IMPAIRMENTS: Abnormal gait, decreased activity tolerance, decreased balance, decreased coordination, decreased endurance, decreased mobility, difficulty walking, decreased strength, impaired perceived functional ability, improper body mechanics, and postural dysfunction.   ACTIVITY LIMITATIONS: carrying, lifting, bending, standing, squatting, sleeping, transfers, bed mobility, bathing, toileting, dressing, reach over head, hygiene/grooming, locomotion level, and caring for others  PARTICIPATION LIMITATIONS: meal prep, cleaning, laundry, driving, shopping, community activity, yard work, and church  PERSONAL FACTORS: back pain, CBP, chronic L shoulder pain, galactorrhea, neuropathy, HPV, hypercholestermeia, HTN, MS, osteopenia, PONV, wrist fracture are also affecting patient's functional outcome.   REHAB POTENTIAL: Good  CLINICAL DECISION MAKING: Evolving/moderate complexity  EVALUATION COMPLEXITY: Moderate  PLAN:  PT FREQUENCY:  2x/week  PT DURATION: 12 weeks  PLANNED INTERVENTIONS: Therapeutic exercises, Therapeutic activity, Neuromuscular re-education, Balance training, Gait training, Patient/Family education, Self Care, Joint mobilization, Stair training, Vestibular training, Canalith repositioning, Orthotic/Fit training, DME instructions, Dry Needling, Electrical stimulation, Spinal mobilization, Cryotherapy, Moist heat, Compression bandaging, scar mobilization, Taping, Traction, Ultrasound, Manual therapy, and Re-evaluation  PLAN FOR NEXT SESSION:  Gait training, LE strengthening, HEP as appropriate  11:00 AM, 06/19/23 Rosamaria Lints, PT, DPT Physical Therapist - [facility]  Outpatient Physical Therapy- [facility] [phone]

## 2023-06-21 ENCOUNTER — Ambulatory Visit: Payer: PPO

## 2023-06-21 DIAGNOSIS — M6281 Muscle weakness (generalized): Secondary | ICD-10-CM | POA: Diagnosis not present

## 2023-06-21 DIAGNOSIS — R2681 Unsteadiness on feet: Secondary | ICD-10-CM

## 2023-06-21 DIAGNOSIS — R2689 Other abnormalities of gait and mobility: Secondary | ICD-10-CM

## 2023-06-21 DIAGNOSIS — R262 Difficulty in walking, not elsewhere classified: Secondary | ICD-10-CM

## 2023-06-21 NOTE — Therapy (Signed)
OUTPATIENT PHYSICAL THERAPY NEURO TREATMENT      Patient Name: Lisa Williams MRN: [id number] DOB:28-Apr-1961, 63 y.o., female Today's Date: 06/21/2023   PCP: Lauro Regulus MD REFERRING PROVIDER: Lauro Regulus MD  END OF SESSION:  PT End of Session - 06/21/23 1012     Visit Number 37    Number of Visits 47    Date for PT Re-Evaluation 07/24/23    Authorization Type HTA primary; Medicaid secondary    Authorization Time Period 02/06/23-05/01/23    Progress Note Due on Visit 40    PT Start Time 1015    PT Stop Time 1059    PT Time Calculation (min) 44 min    Equipment Utilized During Treatment Gait belt    Activity Tolerance Patient tolerated treatment well;No increased pain    Behavior During Therapy [facility] for tasks assessed/performed                                 Past Medical History:  Diagnosis Date   Abdominal pain, right upper quadrant    Back pain    Calculus of kidney 12/09/2013   Chronic back pain    unspecified   Chronic left shoulder pain 07/19/2015   Complication of anesthesia    Functional disorder of bladder    other   Galactorrhea 11/26/2014   Chronic    Hereditary and idiopathic neuropathy 08/19/2013   History of kidney stones    HPV test positive    Hypercholesteremia 08/19/2013   Hypertension    Incomplete bladder emptying    Microscopic hematuria    MS (multiple sclerosis) (HCC)    Muscle spasticity 05/21/2014   Nonspecific findings on examination of urine    other   Osteopenia    PONV (postoperative nausea and vomiting)    Status post laparoscopic supracervical hysterectomy 11/26/2014   Tobacco user 11/26/2014   Wrist fracture    Past Surgical History:  Procedure Laterality Date   bilateral tubal ligation  1996   BREAST CYST EXCISION Left 2002   CYST EXCISION Left 05/10/2022   Procedure: CYST REMOVAL;  Surgeon: Carolan Shiver, MD;  Location: [facility] ORS;  Service: General;  Laterality:  Left;   FRACTURE SURGERY     KNEE SURGERY     right   LAPAROSCOPIC SUPRACERVICAL HYSTERECTOMY  08/05/2013   ORIF WRIST FRACTURE Left 01/17/2017   Procedure: OPEN REDUCTION INTERNAL FIXATION (ORIF) WRIST FRACTURE;  Surgeon: Lyndle Herrlich, MD;  Location: [facility] ORS;  Service: Orthopedics;  Laterality: Left;   RADIOLOGY WITH ANESTHESIA N/A 03/18/2020   Procedure: MRI WITH ANESTHESIA CERVICAL SPINE AND BRAIN  WITH AND WITHOUT CONTRAST;  Surgeon: Radiologist, Medication, MD;  Location: [facility] OR;  Service: Radiology;  Laterality: N/A;   TUBAL LIGATION Bilateral    VAGINAL HYSTERECTOMY  03/2006   Patient Active Problem List   Diagnosis Date Noted   Hypokalemia 07/29/2022   Weakness of both lower extremities 07/29/2022   Abscess of left groin 11/15/2021   Abnormal LFTs 11/15/2021   Acute respiratory disease due to COVID-19 virus 11/21/2020   Weakness    Hypoalbuminemia due to protein-calorie malnutrition [facility])    Neurogenic bowel    Neurogenic bladder    Labile blood pressure    Neuropathic pain    Abscess of female pelvis    SVT (supraventricular tachycardia) (HCC)    Radial styloid tenosynovitis 03/12/2018   Wheelchair dependence 02/27/2018   Localized  osteoporosis with current pathological fracture with routine healing 01/19/2017   Wrist fracture 01/16/2017   Sprain of ankle 03/23/2016   Closed fracture of lateral malleolus 03/16/2016   Health care maintenance 01/24/2016   Blood pressure elevated without history of HTN 10/25/2015   Essential hypertension 10/25/2015   Multiple sclerosis (HCC) 10/02/2015   Chronic left shoulder pain 07/19/2015   Multiple sclerosis exacerbation (HCC) 07/14/2015   MS (multiple sclerosis) (HCC) 11/26/2014   Increased body mass index 11/26/2014   HPV test positive 11/26/2014   Status post laparoscopic supracervical hysterectomy 11/26/2014   Galactorrhea 11/26/2014   Back ache 05/21/2014   Adiposity 05/21/2014   Disordered sleep 05/21/2014   Muscle  spasticity 05/21/2014   Spasticity 05/21/2014   Calculus of kidney 12/09/2013   Renal colic 12/09/2013   Hypercholesteremia 08/19/2013   Hereditary and idiopathic neuropathy 08/19/2013   Hypercholesterolemia without hypertriglyceridemia 08/19/2013   Bladder infection, chronic 07/25/2012   Disorder of bladder function 07/25/2012   Incomplete bladder emptying 07/25/2012   Microscopic hematuria 07/25/2012   Right upper quadrant pain 07/25/2012    ONSET DATE: 1995  REFERRING DIAG: MS, weakness, other closed fracture of proximal end of R tibia  THERAPY DIAG:  Muscle weakness (generalized)  Difficulty in walking, not elsewhere classified  Unsteadiness on feet  Other abnormalities of gait and mobility  Rationale for Evaluation and Treatment: Rehabilitation  SUBJECTIVE:                                                                                                                                                                                             SUBJECTIVE STATEMENT: Patient presents with good motivation.    Pt accompanied by: self  PERTINENT HISTORY: Patient is returning to PT s/p ORIF of R tibia shaft fracture 09/23/2022. Patient has weakness in BLE with RLE>LLE. She drives with hand controls. Patient has been diagnosed with MS in 1995. PMH includes: back pain, CBP, chronic L shoulder pain, galactorrhea, neuropathy, HPV, hypercholestermeia, HTN, MS, osteopenia, PONV, wrist fracture. Additional order for other closed fracture of proximal end of R tibia with routine healing. Still has to use [employer].   PAIN:  Are you having pain? No  PRECAUTIONS: Fall  RED FLAGS: None   WEIGHT BEARING RESTRICTIONS: No  FALLS: Has patient fallen in last 6 months? Yes. Number of falls 1  LIVING ENVIRONMENT: Lives with: lives with their family Lives in: House/apartment Stairs:  ramp from garage into house Has following equipment at home: Dan Humphreys - 2 wheeled, shower chair, Grab  bars, and Ramped entry  PLOF: Independent with household mobility with device  PATIENT GOALS: get  up and pivot and turn, sit to stands. Return to walk, go to bathroom on her own   OBJECTIVE:  Note: Objective measures were completed at Evaluation unless otherwise noted.  DIAGNOSTIC FINDINGS: MRI of the brain 03/18/2020 showed multiple T2/FLAIR hyperintense foci in the periventricular, juxtacortical and deep white matter.  There were no infratentorial lesions noted.  None of the foci enhanced.   Due to severe claustrophobia, the study was done with conscious sedation in the hospital   SENSATION: Lack of sensation in feet, loss in bilateral lateral aspect of knee  LOWER EXTREMITY MMT:    MMT Right Eval Left Eval  Hip flexion 2- 2  Hip extension    Hip abduction 3+ 3+  Hip adduction 3 3  Hip internal rotation    Hip external rotation    Knee flexion 2 2+  Knee extension 2- 2  Ankle dorsiflexion 0 0  Ankle plantarflexion 0 0  Ankle inversion    Ankle eversion    (Blank rows = not tested)  TRANSFERS: Assistive device utilized: Environmental consultant - 2 wheeled  Sit to stand: CGA Stand to sit: CGA  FUNCTIONAL TESTS:  5 times sit to stand: unable to complete 2 consecutive sit to stands  10 meter walk test: perform in future session   PATIENT SURVEYS:  FOTO 34  TODAY'S TREATMENT: DATE: 06/21/23 All activities performed with gait belt and CGA unless otherwise noted.   In // bars: Sit to stand x1 ; no UE support with alternating UE taps to PT shoulder 12x Sit to stand attempt ambulate to end of // bars with wheelchair follow and cues for weight shift onto RLE.  Third stand: attempt at lateral weight shift movement x 5 each LE    Seated: Reach for basketball and toss into hoop x 25 balls   Education on energy conservation techniques   Pt requires significant therapeutic rest breaks throughout the session due to fatigue.  PATIENT EDUCATION: Education details: Pt educated  throughout session about proper posture and technique with exercises. Improved exercise technique, movement at target joints, use of target muscles after min to mod verbal, visual, tactile cues.  Person educated: Patient Education method: Explanation, Demonstration, Tactile cues, and Verbal cues Education comprehension: verbalized understanding, returned demonstration, verbal cues required, and tactile cues required  HOME EXERCISE PROGRAM:  To be given at next visit.   GOALS: Goals reviewed with patient? Yes  SHORT TERM GOALS: Target date: 03/06/2023  Patient will be independent in home exercise program to improve strength/mobility for better functional independence with ADLs.  Baseline: 10/1: give next session 11/5: HEP compliant Goal status: MET   LONG TERM GOALS: Target date:07/24/2023    Patient will increase FOTO score to equal to or greater than 48 to demonstrate statistically significant improvement in mobility and quality of life  Baseline: 10/1: 34%  11/5: 39.8% 12/12: 42% 1/16: 36%  Goal status: partially met   2.  Patient (> 31 years old) will complete five times sit to stand test in < 15 seconds indicating an increased LE strength and improved balance.  Baseline: 10/1; unable to tolerate two consecutive sit to stands 11/5: 2.19.26 5 partial sit to stands 12/12: 37.68s;One full sit to stand bring UE to walker, four partial STS without UE to walker  1/16: one full sit to stand; four partial STS without UE on walker 28.34  Goal status: Partially Met  3.  Patient will increase 10 meter walk test to >0.5 m/s as to improve gait  speed for better community ambulation and to reduce fall risk.  Baseline: 10/1: not tested this session 11/5: unable to stand from wheelchair fully to start walking. 12/12: not appropriate at this time 12/24: takes 3 steps in previous sessions 1/16: not able to get to full stand this session.  Goal status: Ongoing  4.  Patient will transfer  independently at home and no longer require use of Sara Plus Lift for increased independence.  Baseline: 10/1: reliant upon lift 11/5: reliant upon lift still 12/12: reliant upon lift still  1/16: requires use of sit to stander Goal status: partially met    ASSESSMENT:  CLINICAL IMPRESSION: Patient is able to ambulate with improved weight shift and foot clearance this session. She is also able to stand without UE support for prolonged period of time this session indicating increased strength. Pt will continue to benefit from skilled therapy to address remaining deficits in order to improve overall QoL and return to PLOF.   OBJECTIVE IMPAIRMENTS: Abnormal gait, decreased activity tolerance, decreased balance, decreased coordination, decreased endurance, decreased mobility, difficulty walking, decreased strength, impaired perceived functional ability, improper body mechanics, and postural dysfunction.   ACTIVITY LIMITATIONS: carrying, lifting, bending, standing, squatting, sleeping, transfers, bed mobility, bathing, toileting, dressing, reach over head, hygiene/grooming, locomotion level, and caring for others  PARTICIPATION LIMITATIONS: meal prep, cleaning, laundry, driving, shopping, community activity, yard work, and church  PERSONAL FACTORS: back pain, CBP, chronic L shoulder pain, galactorrhea, neuropathy, HPV, hypercholestermeia, HTN, MS, osteopenia, PONV, wrist fracture are also affecting patient's functional outcome.   REHAB POTENTIAL: Good  CLINICAL DECISION MAKING: Evolving/moderate complexity  EVALUATION COMPLEXITY: Moderate  PLAN:  PT FREQUENCY: 2x/week  PT DURATION: 12 weeks  PLANNED INTERVENTIONS: Therapeutic exercises, Therapeutic activity, Neuromuscular re-education, Balance training, Gait training, Patient/Family education, Self Care, Joint mobilization, Stair training, Vestibular training, Canalith repositioning, Orthotic/Fit training, DME instructions, Dry Needling,  Electrical stimulation, Spinal mobilization, Cryotherapy, Moist heat, Compression bandaging, scar mobilization, Taping, Traction, Ultrasound, Manual therapy, and Re-evaluation  PLAN FOR NEXT SESSION:  Gait training, LE strengthening, HEP as appropriate   1:04 PM, 06/21/23 Precious Williams, PT, DPT Physical Therapist - [facility]  Outpatient Physical Therapy- Main Campus [phone]

## 2023-06-25 NOTE — Therapy (Signed)
OUTPATIENT PHYSICAL THERAPY NEURO TREATMENT      Patient Name: Lisa Williams MRN: [id number] DOB:Dec 28, 1960, 63 y.o., female Today's Date: 06/26/2023   PCP: Lauro Regulus MD REFERRING PROVIDER: Lauro Regulus MD  END OF SESSION:  PT End of Session - 06/26/23 1014     Visit Number 38    Number of Visits 47    Date for PT Re-Evaluation 07/24/23    Authorization Type HTA primary; Medicaid secondary    Authorization Time Period 02/06/23-05/01/23    Progress Note Due on Visit 40    PT Start Time 1015    PT Stop Time 1059    PT Time Calculation (min) 44 min    Equipment Utilized During Treatment Gait belt    Activity Tolerance Patient tolerated treatment well;No increased pain    Behavior During Therapy [facility] for tasks assessed/performed                                  Past Medical History:  Diagnosis Date   Abdominal pain, right upper quadrant    Back pain    Calculus of kidney 12/09/2013   Chronic back pain    unspecified   Chronic left shoulder pain 07/19/2015   Complication of anesthesia    Functional disorder of bladder    other   Galactorrhea 11/26/2014   Chronic    Hereditary and idiopathic neuropathy 08/19/2013   History of kidney stones    HPV test positive    Hypercholesteremia 08/19/2013   Hypertension    Incomplete bladder emptying    Microscopic hematuria    MS (multiple sclerosis) (HCC)    Muscle spasticity 05/21/2014   Nonspecific findings on examination of urine    other   Osteopenia    PONV (postoperative nausea and vomiting)    Status post laparoscopic supracervical hysterectomy 11/26/2014   Tobacco user 11/26/2014   Wrist fracture    Past Surgical History:  Procedure Laterality Date   bilateral tubal ligation  1996   BREAST CYST EXCISION Left 2002   CYST EXCISION Left 05/10/2022   Procedure: CYST REMOVAL;  Surgeon: Carolan Shiver, MD;  Location: [facility] ORS;  Service: General;  Laterality:  Left;   FRACTURE SURGERY     KNEE SURGERY     right   LAPAROSCOPIC SUPRACERVICAL HYSTERECTOMY  08/05/2013   ORIF WRIST FRACTURE Left 01/17/2017   Procedure: OPEN REDUCTION INTERNAL FIXATION (ORIF) WRIST FRACTURE;  Surgeon: Lyndle Herrlich, MD;  Location: [facility] ORS;  Service: Orthopedics;  Laterality: Left;   RADIOLOGY WITH ANESTHESIA N/A 03/18/2020   Procedure: MRI WITH ANESTHESIA CERVICAL SPINE AND BRAIN  WITH AND WITHOUT CONTRAST;  Surgeon: Radiologist, Medication, MD;  Location: [facility] OR;  Service: Radiology;  Laterality: N/A;   TUBAL LIGATION Bilateral    VAGINAL HYSTERECTOMY  03/2006   Patient Active Problem List   Diagnosis Date Noted   Hypokalemia 07/29/2022   Weakness of both lower extremities 07/29/2022   Abscess of left groin 11/15/2021   Abnormal LFTs 11/15/2021   Acute respiratory disease due to COVID-19 virus 11/21/2020   Weakness    Hypoalbuminemia due to protein-calorie malnutrition [facility])    Neurogenic bowel    Neurogenic bladder    Labile blood pressure    Neuropathic pain    Abscess of female pelvis    SVT (supraventricular tachycardia) (HCC)    Radial styloid tenosynovitis 03/12/2018   Wheelchair dependence 02/27/2018  Localized osteoporosis with current pathological fracture with routine healing 01/19/2017   Wrist fracture 01/16/2017   Sprain of ankle 03/23/2016   Closed fracture of lateral malleolus 03/16/2016   Health care maintenance 01/24/2016   Blood pressure elevated without history of HTN 10/25/2015   Essential hypertension 10/25/2015   Multiple sclerosis (HCC) 10/02/2015   Chronic left shoulder pain 07/19/2015   Multiple sclerosis exacerbation (HCC) 07/14/2015   MS (multiple sclerosis) (HCC) 11/26/2014   Increased body mass index 11/26/2014   HPV test positive 11/26/2014   Status post laparoscopic supracervical hysterectomy 11/26/2014   Galactorrhea 11/26/2014   Back ache 05/21/2014   Adiposity 05/21/2014   Disordered sleep 05/21/2014   Muscle  spasticity 05/21/2014   Spasticity 05/21/2014   Calculus of kidney 12/09/2013   Renal colic 12/09/2013   Hypercholesteremia 08/19/2013   Hereditary and idiopathic neuropathy 08/19/2013   Hypercholesterolemia without hypertriglyceridemia 08/19/2013   Bladder infection, chronic 07/25/2012   Disorder of bladder function 07/25/2012   Incomplete bladder emptying 07/25/2012   Microscopic hematuria 07/25/2012   Right upper quadrant pain 07/25/2012    ONSET DATE: 1995  REFERRING DIAG: MS, weakness, other closed fracture of proximal end of R tibia  THERAPY DIAG:  Muscle weakness (generalized)  Difficulty in walking, not elsewhere classified  Unsteadiness on feet  Other abnormalities of gait and mobility  Rationale for Evaluation and Treatment: Rehabilitation  SUBJECTIVE:                                                                                                                                                                                             SUBJECTIVE STATEMENT: Patient reports having stomach issues. Isn't feeling good.    Pt accompanied by: self  PERTINENT HISTORY: Patient is returning to PT s/p ORIF of R tibia shaft fracture 09/23/2022. Patient has weakness in BLE with RLE>LLE. She drives with hand controls. Patient has been diagnosed with MS in 1995. PMH includes: back pain, CBP, chronic L shoulder pain, galactorrhea, neuropathy, HPV, hypercholestermeia, HTN, MS, osteopenia, PONV, wrist fracture. Additional order for other closed fracture of proximal end of R tibia with routine healing. Still has to use [employer].   PAIN:  Are you having pain? No  PRECAUTIONS: Fall  RED FLAGS: None   WEIGHT BEARING RESTRICTIONS: No  FALLS: Has patient fallen in last 6 months? Yes. Number of falls 1  LIVING ENVIRONMENT: Lives with: lives with their family Lives in: House/apartment Stairs:  ramp from garage into house Has following equipment at home: Dan Humphreys - 2 wheeled,  shower chair, Grab bars, and Ramped entry  PLOF: Independent with household mobility with device  PATIENT GOALS: get up and pivot and turn, sit to stands. Return to walk, go to bathroom on her own   OBJECTIVE:  Note: Objective measures were completed at Evaluation unless otherwise noted.  DIAGNOSTIC FINDINGS: MRI of the brain 03/18/2020 showed multiple T2/FLAIR hyperintense foci in the periventricular, juxtacortical and deep white matter.  There were no infratentorial lesions noted.  None of the foci enhanced.   Due to severe claustrophobia, the study was done with conscious sedation in the hospital   SENSATION: Lack of sensation in feet, loss in bilateral lateral aspect of knee  LOWER EXTREMITY MMT:    MMT Right Eval Left Eval  Hip flexion 2- 2  Hip extension    Hip abduction 3+ 3+  Hip adduction 3 3  Hip internal rotation    Hip external rotation    Knee flexion 2 2+  Knee extension 2- 2  Ankle dorsiflexion 0 0  Ankle plantarflexion 0 0  Ankle inversion    Ankle eversion    (Blank rows = not tested)  TRANSFERS: Assistive device utilized: Environmental consultant - 2 wheeled  Sit to stand: CGA Stand to sit: CGA  FUNCTIONAL TESTS:  5 times sit to stand: unable to complete 2 consecutive sit to stands  10 meter walk test: perform in future session   PATIENT SURVEYS:  FOTO 34  TODAY'S TREATMENT: DATE: 06/26/23 All activities performed with gait belt and CGA unless otherwise noted.   In // bars: Sit to stand attempt unsuccessful;  3 mini sit to stands     Seated: GTB overhead raise 15x GTB row 15x GTB ER 12x GTB D2 pattern 10x each side RTB adduction 10x each LE RTB abduction 10x each LE; 2 sets Contract/ relax press into PT leg 10x; 2 sets   Education on energy conservation techniques   Pt requires significant therapeutic rest breaks throughout the session due to fatigue.  PATIENT EDUCATION: Education details: Pt educated throughout session about proper posture and  technique with exercises. Improved exercise technique, movement at target joints, use of target muscles after min to mod verbal, visual, tactile cues.  Person educated: Patient Education method: Explanation, Demonstration, Tactile cues, and Verbal cues Education comprehension: verbalized understanding, returned demonstration, verbal cues required, and tactile cues required  HOME EXERCISE PROGRAM:  To be given at next visit.   GOALS: Goals reviewed with patient? Yes  SHORT TERM GOALS: Target date: 03/06/2023  Patient will be independent in home exercise program to improve strength/mobility for better functional independence with ADLs.  Baseline: 10/1: give next session 11/5: HEP compliant Goal status: MET   LONG TERM GOALS: Target date:07/24/2023    Patient will increase FOTO score to equal to or greater than 48 to demonstrate statistically significant improvement in mobility and quality of life  Baseline: 10/1: 34%  11/5: 39.8% 12/12: 42% 1/16: 36%  Goal status: partially met   2.  Patient (> 34 years old) will complete five times sit to stand test in < 15 seconds indicating an increased LE strength and improved balance.  Baseline: 10/1; unable to tolerate two consecutive sit to stands 11/5: 2.19.26 5 partial sit to stands 12/12: 37.68s;One full sit to stand bring UE to walker, four partial STS without UE to walker  1/16: one full sit to stand; four partial STS without UE on walker 28.34  Goal status: Partially Met  3.  Patient will increase 10 meter walk test to >0.5 m/s as to improve gait speed for better community ambulation and to  reduce fall risk.  Baseline: 10/1: not tested this session 11/5: unable to stand from wheelchair fully to start walking. 12/12: not appropriate at this time 12/24: takes 3 steps in previous sessions 1/16: not able to get to full stand this session.  Goal status: Ongoing  4.  Patient will transfer independently at home and no longer require use of  Sara Plus Lift for increased independence.  Baseline: 10/1: reliant upon lift 11/5: reliant upon lift still 12/12: reliant upon lift still  1/16: requires use of sit to stander Goal status: partially met    ASSESSMENT:  CLINICAL IMPRESSION: Patient session limited by fatigue and feeling sick. She is highly motivated despite illness. Full body strengthening tolerated well with multiple rest breaks. Pt will continue to benefit from skilled therapy to address remaining deficits in order to improve overall QoL and return to PLOF.   OBJECTIVE IMPAIRMENTS: Abnormal gait, decreased activity tolerance, decreased balance, decreased coordination, decreased endurance, decreased mobility, difficulty walking, decreased strength, impaired perceived functional ability, improper body mechanics, and postural dysfunction.   ACTIVITY LIMITATIONS: carrying, lifting, bending, standing, squatting, sleeping, transfers, bed mobility, bathing, toileting, dressing, reach over head, hygiene/grooming, locomotion level, and caring for others  PARTICIPATION LIMITATIONS: meal prep, cleaning, laundry, driving, shopping, community activity, yard work, and church  PERSONAL FACTORS: back pain, CBP, chronic L shoulder pain, galactorrhea, neuropathy, HPV, hypercholestermeia, HTN, MS, osteopenia, PONV, wrist fracture are also affecting patient's functional outcome.   REHAB POTENTIAL: Good  CLINICAL DECISION MAKING: Evolving/moderate complexity  EVALUATION COMPLEXITY: Moderate  PLAN:  PT FREQUENCY: 2x/week  PT DURATION: 12 weeks  PLANNED INTERVENTIONS: Therapeutic exercises, Therapeutic activity, Neuromuscular re-education, Balance training, Gait training, Patient/Family education, Self Care, Joint mobilization, Stair training, Vestibular training, Canalith repositioning, Orthotic/Fit training, DME instructions, Dry Needling, Electrical stimulation, Spinal mobilization, Cryotherapy, Moist heat, Compression bandaging, scar  mobilization, Taping, Traction, Ultrasound, Manual therapy, and Re-evaluation  PLAN FOR NEXT SESSION:  Gait training, LE strengthening, HEP as appropriate   11:08 AM, 06/26/23 Precious Bard, PT, DPT Physical Therapist - [facility]  Outpatient Physical Therapy- Main Campus [phone]

## 2023-06-26 ENCOUNTER — Ambulatory Visit: Payer: PPO

## 2023-06-26 DIAGNOSIS — M6281 Muscle weakness (generalized): Secondary | ICD-10-CM | POA: Diagnosis not present

## 2023-06-26 DIAGNOSIS — R2689 Other abnormalities of gait and mobility: Secondary | ICD-10-CM

## 2023-06-26 DIAGNOSIS — R262 Difficulty in walking, not elsewhere classified: Secondary | ICD-10-CM

## 2023-06-26 DIAGNOSIS — R2681 Unsteadiness on feet: Secondary | ICD-10-CM

## 2023-06-28 ENCOUNTER — Ambulatory Visit: Payer: PPO

## 2023-06-30 DIAGNOSIS — M810 Age-related osteoporosis without current pathological fracture: Secondary | ICD-10-CM | POA: Diagnosis not present

## 2023-06-30 DIAGNOSIS — G35 Multiple sclerosis: Secondary | ICD-10-CM | POA: Diagnosis not present

## 2023-06-30 DIAGNOSIS — M6281 Muscle weakness (generalized): Secondary | ICD-10-CM | POA: Diagnosis not present

## 2023-06-30 DIAGNOSIS — R262 Difficulty in walking, not elsewhere classified: Secondary | ICD-10-CM | POA: Diagnosis not present

## 2023-07-02 NOTE — Therapy (Incomplete)
OUTPATIENT PHYSICAL THERAPY NEURO TREATMENT      Patient Name: Lisa Williams Eddington MRN: [id number] DOB:[birth year], 62 y.o., female Today's Date: 07/02/2023   PCP: Lauro Regulus MD REFERRING PROVIDER: Lauro Regulus MD  END OF SESSION:                         Past Medical History:  Diagnosis Date   Abdominal pain, right upper quadrant    Back pain    Calculus of kidney 12/09/2013   Chronic back pain    unspecified   Chronic left shoulder pain 07/19/2015   Complication of anesthesia    Functional disorder of bladder    other   Galactorrhea 11/26/2014   Chronic    Hereditary and idiopathic neuropathy 08/19/2013   History of kidney stones    HPV test positive    Hypercholesteremia 08/19/2013   Hypertension    Incomplete bladder emptying    Microscopic hematuria    MS (multiple sclerosis) (HCC)    Muscle spasticity 05/21/2014   Nonspecific findings on examination of urine    other   Osteopenia    PONV (postoperative nausea and vomiting)    Status post laparoscopic supracervical hysterectomy 11/26/2014   Tobacco user 11/26/2014   Wrist fracture    Past Surgical History:  Procedure Laterality Date   bilateral tubal ligation  1996   BREAST CYST EXCISION Left 2002   CYST EXCISION Left 05/10/2022   Procedure: CYST REMOVAL;  Surgeon: Carolan Shiver, MD;  Location: [facility];  Service: General;  Laterality: Left;   FRACTURE SURGERY     KNEE SURGERY     right   LAPAROSCOPIC SUPRACERVICAL HYSTERECTOMY  08/05/2013   ORIF WRIST FRACTURE Left 01/17/2017   Procedure: OPEN REDUCTION INTERNAL FIXATION (ORIF) WRIST FRACTURE;  Surgeon: Lyndle Herrlich, MD;  Location: [facility];  Service: Orthopedics;  Laterality: Left;   RADIOLOGY WITH ANESTHESIA N/A 03/18/2020   Procedure: MRI WITH ANESTHESIA CERVICAL SPINE AND BRAIN  WITH AND WITHOUT CONTRAST;  Surgeon: Radiologist, Medication, MD;  Location: [facility];  Service: Radiology;  Laterality: N/A;    TUBAL LIGATION Bilateral    VAGINAL HYSTERECTOMY  03/2006   Patient Active Problem List   Diagnosis Date Noted   Hypokalemia 07/29/2022   Weakness of both lower extremities 07/29/2022   Abscess of left groin 11/15/2021   Abnormal LFTs 11/15/2021   Acute respiratory disease due to COVID-19 virus 11/21/2020   Weakness    Hypoalbuminemia due to protein-calorie malnutrition [facility])    Neurogenic bowel    Neurogenic bladder    Labile blood pressure    Neuropathic pain    Abscess of female pelvis    SVT (supraventricular tachycardia) (HCC)    Radial styloid tenosynovitis 03/12/2018   Wheelchair dependence 02/27/2018   Localized osteoporosis with current pathological fracture with routine healing 01/19/2017   Wrist fracture 01/16/2017   Sprain of ankle 03/23/2016   Closed fracture of lateral malleolus 03/16/2016   Health care maintenance 01/24/2016   Blood pressure elevated without history of HTN 10/25/2015   Essential hypertension 10/25/2015   Multiple sclerosis (HCC) 10/02/2015   Chronic left shoulder pain 07/19/2015   Multiple sclerosis exacerbation (HCC) 07/14/2015   MS (multiple sclerosis) (HCC) 11/26/2014   Increased body mass index 11/26/2014   HPV test positive 11/26/2014   Status post laparoscopic supracervical hysterectomy 11/26/2014   Galactorrhea 11/26/2014   Back ache 05/21/2014   Adiposity 05/21/2014   Disordered sleep 05/21/2014  Muscle spasticity 05/21/2014   Spasticity 05/21/2014   Calculus of kidney 12/09/2013   Renal colic 12/09/2013   Hypercholesteremia 08/19/2013   Hereditary and idiopathic neuropathy 08/19/2013   Hypercholesterolemia without hypertriglyceridemia 08/19/2013   Bladder infection, chronic 07/25/2012   Disorder of bladder function 07/25/2012   Incomplete bladder emptying 07/25/2012   Microscopic hematuria 07/25/2012   Right upper quadrant pain 07/25/2012    ONSET DATE: 1995  REFERRING DIAG: MS, weakness, other closed fracture of  proximal end of R tibia  THERAPY DIAG:  No diagnosis found.  Rationale for Evaluation and Treatment: Rehabilitation  SUBJECTIVE:                                                                                                                                                                                             SUBJECTIVE STATEMENT: ***   Pt accompanied by: self  PERTINENT HISTORY: Patient is returning to PT s/p ORIF of R tibia shaft fracture 09/23/2022. Patient has weakness in BLE with RLE>LLE. She drives with hand controls. Patient has been diagnosed with MS in 1995. PMH includes: back pain, CBP, chronic L shoulder pain, galactorrhea, neuropathy, HPV, hypercholestermeia, HTN, MS, osteopenia, PONV, wrist fracture. Additional order for other closed fracture of proximal end of R tibia with routine healing. Still has to use [employer].   PAIN:  Are you having pain? No  PRECAUTIONS: Fall  RED FLAGS: None   WEIGHT BEARING RESTRICTIONS: No  FALLS: Has patient fallen in last 6 months? Yes. Number of falls 1  LIVING ENVIRONMENT: Lives with: lives with their family Lives in: House/apartment Stairs:  ramp from garage into house Has following equipment at home: Dan Humphreys - 2 wheeled, shower chair, Grab bars, and Ramped entry  PLOF: Independent with household mobility with device  PATIENT GOALS: get up and pivot and turn, sit to stands. Return to walk, go to bathroom on her own   OBJECTIVE:  Note: Objective measures were completed at Evaluation unless otherwise noted.  DIAGNOSTIC FINDINGS: MRI of the brain 03/18/2020 showed multiple T2/FLAIR hyperintense foci in the periventricular, juxtacortical and deep white matter.  There were no infratentorial lesions noted.  None of the foci enhanced.   Due to severe claustrophobia, the study was done with conscious sedation in the hospital   SENSATION: Lack of sensation in feet, loss in bilateral lateral aspect of knee  LOWER EXTREMITY MMT:     MMT Right Eval Left Eval  Hip flexion 2- 2  Hip extension    Hip abduction 3+ 3+  Hip adduction 3 3  Hip internal rotation    Hip external rotation  Knee flexion 2 2+  Knee extension 2- 2  Ankle dorsiflexion 0 0  Ankle plantarflexion 0 0  Ankle inversion    Ankle eversion    (Blank rows = not tested)  TRANSFERS: Assistive device utilized: Environmental consultant - 2 wheeled  Sit to stand: CGA Stand to sit: CGA  FUNCTIONAL TESTS:  5 times sit to stand: unable to complete 2 consecutive sit to stands  10 meter walk test: perform in future session   PATIENT SURVEYS:  FOTO 34  TODAY'S TREATMENT: DATE: 07/02/23 All activities performed with gait belt and CGA unless otherwise noted.   In // bars: Sit to stand attempt unsuccessful;  3 mini sit to stands     Seated: GTB overhead raise 15x GTB row 15x GTB ER 12x GTB D2 pattern 10x each side RTB adduction 10x each LE RTB abduction 10x each LE; 2 sets Contract/ relax press into PT leg 10x; 2 sets   Education on energy conservation techniques   Pt requires significant therapeutic rest breaks throughout the session due to fatigue.  PATIENT EDUCATION: Education details: Pt educated throughout session about proper posture and technique with exercises. Improved exercise technique, movement at target joints, use of target muscles after min to mod verbal, visual, tactile cues.  Person educated: Patient Education method: Explanation, Demonstration, Tactile cues, and Verbal cues Education comprehension: verbalized understanding, returned demonstration, verbal cues required, and tactile cues required  HOME EXERCISE PROGRAM:  To be given at next visit.   GOALS: Goals reviewed with patient? Yes  SHORT TERM GOALS: Target date: 03/06/2023  Patient will be independent in home exercise program to improve strength/mobility for better functional independence with ADLs.  Baseline: 10/1: give next session 11/5: HEP compliant Goal status:  MET   LONG TERM GOALS: Target date:07/24/2023    Patient will increase FOTO score to equal to or greater than 48 to demonstrate statistically significant improvement in mobility and quality of life  Baseline: 10/1: 34%  11/5: 39.8% 12/12: 42% 1/16: 36%  Goal status: partially met   2.  Patient (> 6 years old) will complete five times sit to stand test in < 15 seconds indicating an increased LE strength and improved balance.  Baseline: 10/1; unable to tolerate two consecutive sit to stands 11/5: 2.19.26 5 partial sit to stands 12/12: 37.68s;One full sit to stand bring UE to walker, four partial STS without UE to walker  1/16: one full sit to stand; four partial STS without UE on walker 28.34  Goal status: Partially Met  3.  Patient will increase 10 meter walk test to >0.5 m/s as to improve gait speed for better community ambulation and to reduce fall risk.  Baseline: 10/1: not tested this session 11/5: unable to stand from wheelchair fully to start walking. 12/12: not appropriate at this time 12/24: takes 3 steps in previous sessions 1/16: not able to get to full stand this session.  Goal status: Ongoing  4.  Patient will transfer independently at home and no longer require use of Sara Plus Lift for increased independence.  Baseline: 10/1: reliant upon lift 11/5: reliant upon lift still 12/12: reliant upon lift still  1/16: requires use of sit to stander Goal status: partially met    ASSESSMENT:  CLINICAL IMPRESSION: ***Pt will continue to benefit from skilled therapy to address remaining deficits in order to improve overall QoL and return to PLOF.   OBJECTIVE IMPAIRMENTS: Abnormal gait, decreased activity tolerance, decreased balance, decreased coordination, decreased endurance, decreased mobility, difficulty walking,  decreased strength, impaired perceived functional ability, improper body mechanics, and postural dysfunction.   ACTIVITY LIMITATIONS: carrying, lifting, bending,  standing, squatting, sleeping, transfers, bed mobility, bathing, toileting, dressing, reach over head, hygiene/grooming, locomotion level, and caring for others  PARTICIPATION LIMITATIONS: meal prep, cleaning, laundry, driving, shopping, community activity, yard work, and church  PERSONAL FACTORS: back pain, CBP, chronic L shoulder pain, galactorrhea, neuropathy, HPV, hypercholestermeia, HTN, MS, osteopenia, PONV, wrist fracture are also affecting patient's functional outcome.   REHAB POTENTIAL: Good  CLINICAL DECISION MAKING: Evolving/moderate complexity  EVALUATION COMPLEXITY: Moderate  PLAN:  PT FREQUENCY: 2x/week  PT DURATION: 12 weeks  PLANNED INTERVENTIONS: Therapeutic exercises, Therapeutic activity, Neuromuscular re-education, Balance training, Gait training, Patient/Family education, Self Care, Joint mobilization, Stair training, Vestibular training, Canalith repositioning, Orthotic/Fit training, DME instructions, Dry Needling, Electrical stimulation, Spinal mobilization, Cryotherapy, Moist heat, Compression bandaging, scar mobilization, Taping, Traction, Ultrasound, Manual therapy, and Re-evaluation  PLAN FOR NEXT SESSION:  Gait training, LE strengthening, HEP as appropriate   1:16 PM, 07/02/23 Precious Bard, PT, DPT Physical Therapist - [place] [facility]  Outpatient Physical Therapy- Main Campus [phone]

## 2023-07-03 ENCOUNTER — Ambulatory Visit: Payer: PPO

## 2023-07-04 NOTE — Therapy (Signed)
 OUTPATIENT PHYSICAL THERAPY NEURO TREATMENT      Patient Name: Lisa Williams MRN: [id number] DOB:19-Jan-1961, 63 y.o., female Today's Date: 07/05/2023   PCP: Lauro Regulus MD REFERRING PROVIDER: Lauro Regulus MD  END OF SESSION:  PT End of Session - 07/05/23 1013     Visit Number 39    Number of Visits 47    Date for PT Re-Evaluation 07/24/23    Authorization Type HTA primary; Medicaid secondary    Authorization Time Period 02/06/23-05/01/23    Progress Note Due on Visit 40    PT Start Time 1015    PT Stop Time 1058    PT Time Calculation (min) 43 min    Equipment Utilized During Treatment Gait belt    Activity Tolerance Patient tolerated treatment well;No increased pain    Behavior During Therapy [facility] for tasks assessed/performed                                   Past Medical History:  Diagnosis Date   Abdominal pain, right upper quadrant    Back pain    Calculus of kidney 12/09/2013   Chronic back pain    unspecified   Chronic left shoulder pain 07/19/2015   Complication of anesthesia    Functional disorder of bladder    other   Galactorrhea 11/26/2014   Chronic    Hereditary and idiopathic neuropathy 08/19/2013   History of kidney stones    HPV test positive    Hypercholesteremia 08/19/2013   Hypertension    Incomplete bladder emptying    Microscopic hematuria    MS (multiple sclerosis) (HCC)    Muscle spasticity 05/21/2014   Nonspecific findings on examination of urine    other   Osteopenia    PONV (postoperative nausea and vomiting)    Status post laparoscopic supracervical hysterectomy 11/26/2014   Tobacco user 11/26/2014   Wrist fracture    Past Surgical History:  Procedure Laterality Date   bilateral tubal ligation  1996   BREAST CYST EXCISION Left 2002   CYST EXCISION Left 05/10/2022   Procedure: CYST REMOVAL;  Surgeon: Carolan Shiver, MD;  Location: [facility] ORS;  Service: General;   Laterality: Left;   FRACTURE SURGERY     KNEE SURGERY     right   LAPAROSCOPIC SUPRACERVICAL HYSTERECTOMY  08/05/2013   ORIF WRIST FRACTURE Left 01/17/2017   Procedure: OPEN REDUCTION INTERNAL FIXATION (ORIF) WRIST FRACTURE;  Surgeon: Lyndle Herrlich, MD;  Location: [facility] ORS;  Service: Orthopedics;  Laterality: Left;   RADIOLOGY WITH ANESTHESIA N/A 03/18/2020   Procedure: MRI WITH ANESTHESIA CERVICAL SPINE AND BRAIN  WITH AND WITHOUT CONTRAST;  Surgeon: Radiologist, Medication, MD;  Location: [facility] OR;  Service: Radiology;  Laterality: N/A;   TUBAL LIGATION Bilateral    VAGINAL HYSTERECTOMY  03/2006   Patient Active Problem List   Diagnosis Date Noted   Hypokalemia 07/29/2022   Weakness of both lower extremities 07/29/2022   Abscess of left groin 11/15/2021   Abnormal LFTs 11/15/2021   Acute respiratory disease due to COVID-19 virus 11/21/2020   Weakness    Hypoalbuminemia due to protein-calorie malnutrition [facility])    Neurogenic bowel    Neurogenic bladder    Labile blood pressure    Neuropathic pain    Abscess of female pelvis    SVT (supraventricular tachycardia) (HCC)    Radial styloid tenosynovitis 03/12/2018   Wheelchair dependence 02/27/2018  Localized osteoporosis with current pathological fracture with routine healing 01/19/2017   Wrist fracture 01/16/2017   Sprain of ankle 03/23/2016   Closed fracture of lateral malleolus 03/16/2016   Health care maintenance 01/24/2016   Blood pressure elevated without history of HTN 10/25/2015   Essential hypertension 10/25/2015   Multiple sclerosis (HCC) 10/02/2015   Chronic left shoulder pain 07/19/2015   Multiple sclerosis exacerbation (HCC) 07/14/2015   MS (multiple sclerosis) (HCC) 11/26/2014   Increased body mass index 11/26/2014   HPV test positive 11/26/2014   Status post laparoscopic supracervical hysterectomy 11/26/2014   Galactorrhea 11/26/2014   Back ache 05/21/2014   Adiposity 05/21/2014   Disordered sleep 05/21/2014    Muscle spasticity 05/21/2014   Spasticity 05/21/2014   Calculus of kidney 12/09/2013   Renal colic 12/09/2013   Hypercholesteremia 08/19/2013   Hereditary and idiopathic neuropathy 08/19/2013   Hypercholesterolemia without hypertriglyceridemia 08/19/2013   Bladder infection, chronic 07/25/2012   Disorder of bladder function 07/25/2012   Incomplete bladder emptying 07/25/2012   Microscopic hematuria 07/25/2012   Right upper quadrant pain 07/25/2012    ONSET DATE: 1995  REFERRING DIAG: MS, weakness, other closed fracture of proximal end of R tibia  THERAPY DIAG:  Muscle weakness (generalized)  Difficulty in walking, not elsewhere classified  Unsteadiness on feet  Rationale for Evaluation and Treatment: Rehabilitation  SUBJECTIVE:                                                                                                                                                                                             SUBJECTIVE STATEMENT: Patient is eager to stand.    Pt accompanied by: self  PERTINENT HISTORY: Patient is returning to PT s/p ORIF of R tibia shaft fracture 09/23/2022. Patient has weakness in BLE with RLE>LLE. She drives with hand controls. Patient has been diagnosed with MS in 1995. PMH includes: back pain, CBP, chronic L shoulder pain, galactorrhea, neuropathy, HPV, hypercholestermeia, HTN, MS, osteopenia, PONV, wrist fracture. Additional order for other closed fracture of proximal end of R tibia with routine healing. Still has to use [employer].   PAIN:  Are you having pain? No  PRECAUTIONS: Fall  RED FLAGS: None   WEIGHT BEARING RESTRICTIONS: No  FALLS: Has patient fallen in last 6 months? Yes. Number of falls 1  LIVING ENVIRONMENT: Lives with: lives with their family Lives in: House/apartment Stairs:  ramp from garage into house Has following equipment at home: Dan Humphreys - 2 wheeled, shower chair, Grab bars, and Ramped entry  PLOF: Independent with  household mobility with device  PATIENT GOALS: get up and pivot and turn, sit  to stands. Return to walk, go to bathroom on her own   OBJECTIVE:  Note: Objective measures were completed at Evaluation unless otherwise noted.  DIAGNOSTIC FINDINGS: MRI of the brain 03/18/2020 showed multiple T2/FLAIR hyperintense foci in the periventricular, juxtacortical and deep white matter.  There were no infratentorial lesions noted.  None of the foci enhanced.   Due to severe claustrophobia, the study was done with conscious sedation in the hospital   SENSATION: Lack of sensation in feet, loss in bilateral lateral aspect of knee  LOWER EXTREMITY MMT:    MMT Right Eval Left Eval  Hip flexion 2- 2  Hip extension    Hip abduction 3+ 3+  Hip adduction 3 3  Hip internal rotation    Hip external rotation    Knee flexion 2 2+  Knee extension 2- 2  Ankle dorsiflexion 0 0  Ankle plantarflexion 0 0  Ankle inversion    Ankle eversion    (Blank rows = not tested)  TRANSFERS: Assistive device utilized: Environmental consultant - 2 wheeled  Sit to stand: CGA Stand to sit: CGA  FUNCTIONAL TESTS:  5 times sit to stand: unable to complete 2 consecutive sit to stands  10 meter walk test: perform in future session   PATIENT SURVEYS:  FOTO 34  TODAY'S TREATMENT: DATE: 07/05/23 All activities performed with gait belt and CGA unless otherwise noted.   In // bars: 7 sit to stands;  -three successful stands to full stand position  -attempt at ambulation unsuccessful due to LE buckling.     Seated: GTB overhead raise 15x GTB row 15x GTB ER 12x RTB adduction 10x each LE RTB abduction 10x each LE; 2 sets Contract/ relax press into PT leg 10x; 2 sets   Education on energy conservation techniques   Pt requires significant therapeutic rest breaks throughout the session due to fatigue.  PATIENT EDUCATION: Education details: Pt educated throughout session about proper posture and technique with exercises.  Improved exercise technique, movement at target joints, use of target muscles after min to mod verbal, visual, tactile cues.  Person educated: Patient Education method: Explanation, Demonstration, Tactile cues, and Verbal cues Education comprehension: verbalized understanding, returned demonstration, verbal cues required, and tactile cues required  HOME EXERCISE PROGRAM:  To be given at next visit.   GOALS: Goals reviewed with patient? Yes  SHORT TERM GOALS: Target date: 03/06/2023  Patient will be independent in home exercise program to improve strength/mobility for better functional independence with ADLs.  Baseline: 10/1: give next session 11/5: HEP compliant Goal status: MET   LONG TERM GOALS: Target date:07/24/2023    Patient will increase FOTO score to equal to or greater than 48 to demonstrate statistically significant improvement in mobility and quality of life  Baseline: 10/1: 34%  11/5: 39.8% 12/12: 42% 1/16: 36%  Goal status: partially met   2.  Patient (> 29 years old) will complete five times sit to stand test in < 15 seconds indicating an increased LE strength and improved balance.  Baseline: 10/1; unable to tolerate two consecutive sit to stands 11/5: 2.19.26 5 partial sit to stands 12/12: 37.68s;One full sit to stand bring UE to walker, four partial STS without UE to walker  1/16: one full sit to stand; four partial STS without UE on walker 28.34  Goal status: Partially Met  3.  Patient will increase 10 meter walk test to >0.5 m/s as to improve gait speed for better community ambulation and to reduce fall risk.  Baseline:  10/1: not tested this session 11/5: unable to stand from wheelchair fully to start walking. 12/12: not appropriate at this time 12/24: takes 3 steps in previous sessions 1/16: not able to get to full stand this session.  Goal status: Ongoing  4.  Patient will transfer independently at home and no longer require use of Sara Plus Lift for increased  independence.  Baseline: 10/1: reliant upon lift 11/5: reliant upon lift still 12/12: reliant upon lift still  1/16: requires use of sit to stander Goal status: partially met    ASSESSMENT:  CLINICAL IMPRESSION: Patient is challenged with standing today with decreased success with performance of stands. Bilateral LE buckle with attempt at standing resulting in instability. She is eager to progress strengthening despite fatigue. Pt will continue to benefit from skilled therapy to address remaining deficits in order to improve overall QoL and return to PLOF.   OBJECTIVE IMPAIRMENTS: Abnormal gait, decreased activity tolerance, decreased balance, decreased coordination, decreased endurance, decreased mobility, difficulty walking, decreased strength, impaired perceived functional ability, improper body mechanics, and postural dysfunction.   ACTIVITY LIMITATIONS: carrying, lifting, bending, standing, squatting, sleeping, transfers, bed mobility, bathing, toileting, dressing, reach over head, hygiene/grooming, locomotion level, and caring for others  PARTICIPATION LIMITATIONS: meal prep, cleaning, laundry, driving, shopping, community activity, yard work, and church  PERSONAL FACTORS: back pain, CBP, chronic L shoulder pain, galactorrhea, neuropathy, HPV, hypercholestermeia, HTN, MS, osteopenia, PONV, wrist fracture are also affecting patient's functional outcome.   REHAB POTENTIAL: Good  CLINICAL DECISION MAKING: Evolving/moderate complexity  EVALUATION COMPLEXITY: Moderate  PLAN:  PT FREQUENCY: 2x/week  PT DURATION: 12 weeks  PLANNED INTERVENTIONS: Therapeutic exercises, Therapeutic activity, Neuromuscular re-education, Balance training, Gait training, Patient/Family education, Self Care, Joint mobilization, Stair training, Vestibular training, Canalith repositioning, Orthotic/Fit training, DME instructions, Dry Needling, Electrical stimulation, Spinal mobilization, Cryotherapy, Moist  heat, Compression bandaging, scar mobilization, Taping, Traction, Ultrasound, Manual therapy, and Re-evaluation  PLAN FOR NEXT SESSION:  Gait training, LE strengthening, HEP as appropriate   10:58 AM, 07/05/23 Precious Bard, PT, DPT Physical Therapist -  [facility]  Outpatient Physical Therapy- Main Campus [phone]

## 2023-07-05 ENCOUNTER — Ambulatory Visit: Payer: PPO

## 2023-07-05 DIAGNOSIS — M6281 Muscle weakness (generalized): Secondary | ICD-10-CM

## 2023-07-05 DIAGNOSIS — R262 Difficulty in walking, not elsewhere classified: Secondary | ICD-10-CM

## 2023-07-05 DIAGNOSIS — R2681 Unsteadiness on feet: Secondary | ICD-10-CM

## 2023-07-07 ENCOUNTER — Observation Stay
Admission: EM | Admit: 2023-07-07 | Discharge: 2023-07-09 | Disposition: A | Discharge status: Home / Self Care | Attending: Hospitalist | Admitting: Hospitalist

## 2023-07-07 ENCOUNTER — Emergency Department

## 2023-07-07 ENCOUNTER — Other Ambulatory Visit: Payer: Self-pay

## 2023-07-07 DIAGNOSIS — Z6836 Body mass index (BMI) 36.0-36.9, adult: Secondary | ICD-10-CM | POA: Diagnosis not present

## 2023-07-07 DIAGNOSIS — R079 Chest pain, unspecified: Secondary | ICD-10-CM | POA: Diagnosis not present

## 2023-07-07 DIAGNOSIS — K5900 Constipation, unspecified: Secondary | ICD-10-CM | POA: Diagnosis not present

## 2023-07-07 DIAGNOSIS — E669 Obesity, unspecified: Secondary | ICD-10-CM | POA: Insufficient documentation

## 2023-07-07 DIAGNOSIS — R0789 Other chest pain: Principal | ICD-10-CM | POA: Insufficient documentation

## 2023-07-07 DIAGNOSIS — Z1152 Encounter for screening for COVID-19: Secondary | ICD-10-CM | POA: Diagnosis not present

## 2023-07-07 DIAGNOSIS — Z8739 Personal history of other diseases of the musculoskeletal system and connective tissue: Secondary | ICD-10-CM | POA: Insufficient documentation

## 2023-07-07 DIAGNOSIS — J9811 Atelectasis: Secondary | ICD-10-CM | POA: Diagnosis not present

## 2023-07-07 DIAGNOSIS — Z79899 Other long term (current) drug therapy: Secondary | ICD-10-CM | POA: Diagnosis not present

## 2023-07-07 DIAGNOSIS — I10 Essential (primary) hypertension: Secondary | ICD-10-CM | POA: Diagnosis not present

## 2023-07-07 DIAGNOSIS — R9389 Abnormal findings on diagnostic imaging of other specified body structures: Secondary | ICD-10-CM | POA: Diagnosis not present

## 2023-07-07 DIAGNOSIS — R1111 Vomiting without nausea: Secondary | ICD-10-CM | POA: Diagnosis not present

## 2023-07-07 DIAGNOSIS — R1110 Vomiting, unspecified: Secondary | ICD-10-CM | POA: Insufficient documentation

## 2023-07-07 DIAGNOSIS — Z87891 Personal history of nicotine dependence: Secondary | ICD-10-CM | POA: Insufficient documentation

## 2023-07-07 DIAGNOSIS — I700 Atherosclerosis of aorta: Secondary | ICD-10-CM | POA: Diagnosis not present

## 2023-07-07 DIAGNOSIS — R000 Tachycardia, unspecified: Principal | ICD-10-CM | POA: Insufficient documentation

## 2023-07-07 DIAGNOSIS — R109 Unspecified abdominal pain: Secondary | ICD-10-CM | POA: Diagnosis not present

## 2023-07-07 DIAGNOSIS — R939 Diagnostic imaging inconclusive due to excess body fat of patient: Secondary | ICD-10-CM | POA: Diagnosis not present

## 2023-07-07 DIAGNOSIS — R1010 Upper abdominal pain, unspecified: Secondary | ICD-10-CM | POA: Diagnosis not present

## 2023-07-07 LAB — COMPREHENSIVE METABOLIC PANEL
ALT: 124 U/L — ABNORMAL HIGH (ref 0–44)
AST: 95 U/L — ABNORMAL HIGH (ref 15–41)
Albumin: 3.9 g/dL (ref 3.5–5.0)
Alkaline Phosphatase: 168 U/L — ABNORMAL HIGH (ref 38–126)
Anion gap: 10 (ref 5–15)
BUN: 29 mg/dL — ABNORMAL HIGH (ref 8–23)
CO2: 19 mmol/L — ABNORMAL LOW (ref 22–32)
Calcium: 8.8 mg/dL — ABNORMAL LOW (ref 8.9–10.3)
Chloride: 114 mmol/L — ABNORMAL HIGH (ref 98–111)
Creatinine, Ser: 0.58 mg/dL (ref 0.44–1.00)
GFR, Estimated: >60 mL/min (ref >60)
Glucose, Bld: 118 mg/dL — ABNORMAL HIGH (ref 70–99)
Potassium: 4.8 mmol/L (ref 3.5–5.1)
Sodium: 143 mmol/L (ref 135–145)
Total Bilirubin: 1.5 mg/dL — ABNORMAL HIGH (ref 0.0–1.2)
Total Protein: 7.3 g/dL (ref 6.5–8.1)

## 2023-07-07 LAB — TROPONIN I (HIGH SENSITIVITY)
Troponin I (High Sensitivity): 4 ng/L (ref <18)
Troponin I (High Sensitivity): 6 ng/L (ref <18)

## 2023-07-07 LAB — LIPASE, BLOOD: Lipase: 24 U/L (ref 11–51)

## 2023-07-07 LAB — URINE DRUG SCREEN, QUALITATIVE (ARMC ONLY)
Amphetamines, Ur Screen: NOT DETECTED
Barbiturates, Ur Screen: NOT DETECTED
Benzodiazepine, Ur Scrn: NOT DETECTED
Cannabinoid 50 Ng, Ur ~~LOC~~: NOT DETECTED
Cocaine Metabolite,Ur ~~LOC~~: NOT DETECTED
MDMA (Ecstasy)Ur Screen: NOT DETECTED
Methadone Scn, Ur: NOT DETECTED
Opiate, Ur Screen: NOT DETECTED
Phencyclidine (PCP) Ur S: NOT DETECTED
Tricyclic, Ur Screen: NOT DETECTED

## 2023-07-07 LAB — CBC
HCT: 42.7 % (ref 36.0–46.0)
Hemoglobin: 14.7 g/dL (ref 12.0–15.0)
MCH: 26.7 pg (ref 26.0–34.0)
MCHC: 34.4 g/dL (ref 30.0–36.0)
MCV: 77.5 fL — ABNORMAL LOW (ref 80.0–100.0)
Platelets: 275 10*3/uL (ref 150–400)
RBC: 5.51 MIL/uL — ABNORMAL HIGH (ref 3.87–5.11)
RDW: 14.9 % (ref 11.5–15.5)
WBC: 12.4 10*3/uL — ABNORMAL HIGH (ref 4.0–10.5)
nRBC: 0 % (ref 0.0–0.2)

## 2023-07-07 LAB — RESP PANEL BY RT-PCR (RSV, FLU A&B, COVID)  RVPGX2
Influenza A by PCR: NEGATIVE
Influenza B by PCR: NEGATIVE
Resp Syncytial Virus by PCR: NEGATIVE
SARS Coronavirus 2 by RT PCR: NEGATIVE

## 2023-07-07 MED ORDER — POLYETHYLENE GLYCOL 3350 17 G PO PACK
34.0000 g | PACK | ORAL | Status: AC
  Administered 2023-07-07 (×2): 34 g via ORAL
  Filled 2023-07-07 (×2): qty 2

## 2023-07-07 MED ORDER — ACETAMINOPHEN 500 MG PO TABS
1000.0000 mg | ORAL_TABLET | Freq: Three times a day (TID) | ORAL | Status: DC | PRN
  Administered 2023-07-07: 1000 mg via ORAL
  Filled 2023-07-07: qty 2

## 2023-07-07 MED ORDER — ENOXAPARIN SODIUM 60 MG/0.6ML IJ SOSY
0.5000 mg/kg | PREFILLED_SYRINGE | INTRAMUSCULAR | Status: DC
  Administered 2023-07-07 – 2023-07-09 (×3): 60 mg via SUBCUTANEOUS
  Filled 2023-07-07 (×3): qty 0.6

## 2023-07-07 MED ORDER — TIZANIDINE HCL 2 MG PO TABS
8.0000 mg | ORAL_TABLET | Freq: Three times a day (TID) | ORAL | Status: DC
  Filled 2023-07-07 (×3): qty 4

## 2023-07-07 MED ORDER — BACLOFEN 10 MG PO TABS
20.0000 mg | ORAL_TABLET | Freq: Three times a day (TID) | ORAL | Status: DC
  Administered 2023-07-07 – 2023-07-09 (×6): 20 mg via ORAL
  Filled 2023-07-07 (×6): qty 2

## 2023-07-07 MED ORDER — IOHEXOL 350 MG/ML SOLN
75.0000 mL | Freq: Once | INTRAVENOUS | Status: AC | PRN
  Administered 2023-07-07: 75 mL via INTRAVENOUS

## 2023-07-07 MED ORDER — OXYBUTYNIN CHLORIDE ER 10 MG PO TB24
10.0000 mg | ORAL_TABLET | Freq: Every day | ORAL | Status: DC
  Administered 2023-07-08 – 2023-07-09 (×2): 10 mg via ORAL
  Filled 2023-07-07 (×2): qty 1

## 2023-07-07 MED ORDER — SIPONIMOD FUMARATE 2 MG PO TABS
2.0000 mg | ORAL_TABLET | Freq: Every day | ORAL | Status: DC
  Administered 2023-07-09: 2 mg via ORAL

## 2023-07-07 MED ORDER — TIZANIDINE HCL 2 MG PO TABS
4.0000 mg | ORAL_TABLET | Freq: Every day | ORAL | Status: DC
  Administered 2023-07-07: 4 mg via ORAL
  Filled 2023-07-07: qty 2

## 2023-07-07 MED ORDER — TIZANIDINE HCL 2 MG PO TABS
2.0000 mg | ORAL_TABLET | Freq: Two times a day (BID) | ORAL | Status: DC
  Administered 2023-07-08: 2 mg via ORAL
  Filled 2023-07-07: qty 1

## 2023-07-07 MED ORDER — TIZANIDINE HCL 2 MG PO TABS: 8.0000 mg | ORAL_TABLET | Freq: Three times a day (TID) | ORAL | Status: DC

## 2023-07-07 MED ORDER — NITROGLYCERIN 0.4 MG SL SUBL
0.4000 mg | SUBLINGUAL_TABLET | Freq: Once | SUBLINGUAL | Status: AC
  Administered 2023-07-07: 0.4 mg via SUBLINGUAL
  Filled 2023-07-07: qty 1

## 2023-07-07 MED ORDER — ACETAMINOPHEN 325 MG PO TABS
650.0000 mg | ORAL_TABLET | Freq: Once | ORAL | Status: AC
  Administered 2023-07-07: 650 mg via ORAL
  Filled 2023-07-07: qty 2

## 2023-07-07 MED ORDER — ONDANSETRON HCL 4 MG/2ML IJ SOLN: 4.0000 mg | Freq: Four times a day (QID) | INTRAMUSCULAR | Status: DC | PRN

## 2023-07-07 MED ORDER — GABAPENTIN 100 MG PO CAPS
100.0000 mg | ORAL_CAPSULE | Freq: Two times a day (BID) | ORAL | Status: DC
  Administered 2023-07-07 – 2023-07-09 (×4): 100 mg via ORAL
  Filled 2023-07-07 (×4): qty 1

## 2023-07-07 MED ORDER — ONDANSETRON HCL 4 MG/2ML IJ SOLN
4.0000 mg | Freq: Once | INTRAMUSCULAR | Status: AC
  Administered 2023-07-07: 4 mg via INTRAVENOUS
  Filled 2023-07-07: qty 2

## 2023-07-07 MED ORDER — BACLOFEN 10 MG PO TABS
20.0000 mg | ORAL_TABLET | Freq: Three times a day (TID) | ORAL | Status: DC
Start: 2023-07-07 — End: 2023-07-07

## 2023-07-07 MED ORDER — IRBESARTAN 150 MG PO TABS
150.0000 mg | ORAL_TABLET | Freq: Every day | ORAL | Status: DC
  Administered 2023-07-07 – 2023-07-09 (×3): 150 mg via ORAL
  Filled 2023-07-07 (×3): qty 1

## 2023-07-07 MED ORDER — HYDRALAZINE HCL 20 MG/ML IJ SOLN: 10.0000 mg | Freq: Four times a day (QID) | INTRAMUSCULAR | Status: DC | PRN

## 2023-07-07 MED ORDER — SODIUM CHLORIDE 0.9 % IV BOLUS
500.0000 mL | Freq: Once | INTRAVENOUS | Status: AC
  Administered 2023-07-07: 500 mL via INTRAVENOUS

## 2023-07-07 NOTE — ED Provider Notes (Signed)
 [facility] Provider Note    Event Date/Time   First MD Initiated Contact with Patient 07/07/23 [phone]     (approximate)   History   Chest Pain   HPI  Lisa Williams is a 63 y.o. female with a history of MS who suffered leg fracture in the fall which is led to difficulty in ambulating, she is attending PT for this and making steady  progress.  She reports around 1230 this morning she started to feel nauseated, has had vomiting and also reports chest pressure primarily when she lays down.  No history of CAD reported nor in the record.  She continues to feel nauseated now despite receiving 4 mg of IV Zofran from EMS     Physical Exam   Triage Vital Signs: ED Triage Vitals  Encounter Vitals Group     BP --      Systolic BP Percentile --      Diastolic BP Percentile --      Pulse --      Resp --      Temp --      Temp src --      SpO2 --      Weight 07/07/23 0721 118.8 kg (262 lb)     Height 07/07/23 0721 1.803 m (5\' 11" )     Head Circumference --      Peak Flow --      Pain Score 07/07/23 0716 8     Pain Loc --      Pain Education --      Exclude from Growth Chart --     Most recent vital signs: Vitals:   07/07/23 1300 07/07/23 1353  BP: (!) 179/105 (!) 149/89  Pulse: (!) 125 (!) 118  Resp:  (!) 23  Temp:    SpO2:  98%     General: Awake, no distress.  CV:  Good peripheral perfusion.  Tachycardia, regular rhythm Resp:  Normal effort.  Clear to auscultation bilaterally Abd:  No distention.  Soft, nontender, reassuring exam Other:  No significant lower extremity edema   ED Results / Procedures / Treatments   Labs (all labs ordered are listed, but only abnormal results are displayed) Labs Reviewed  CBC - Abnormal; Notable for the following components:      Result Value   WBC 12.4 (*)    RBC 5.51 (*)    MCV 77.5 (*)    All other components within normal limits  COMPREHENSIVE METABOLIC PANEL - Abnormal; Notable for the  following components:   Chloride 114 (*)    CO2 19 (*)    Glucose, Bld 118 (*)    BUN 29 (*)    Calcium 8.8 (*)    AST 95 (*)    ALT 124 (*)    Alkaline Phosphatase 168 (*)    Total Bilirubin 1.5 (*)    All other components within normal limits  RESP PANEL BY RT-PCR (RSV, FLU A&B, COVID)  RVPGX2  LIPASE, BLOOD  HIV ANTIBODY (ROUTINE TESTING W REFLEX)  TROPONIN I (HIGH SENSITIVITY)  TROPONIN I (HIGH SENSITIVITY)     EKG Reviewed EKG from April 24, 2023 which was unremarkable   ED ECG REPORT I, Jene Every, the attending physician, personally viewed and interpreted this ECG.  Date: 07/07/2023  Rhythm: Sinus tachycardia QRS Axis: normal Intervals: normal qtc ST/T Wave abnormalities: normal Narrative Interpretation: no evidence of acute ischemia    RADIOLOGY CT imaging overall reassuring  PROCEDURES:  Critical Care performed:   Procedures   MEDICATIONS ORDERED IN ED: Medications  polyethylene glycol (MIRALAX / GLYCOLAX) packet 34 g (has no administration in time range)  Siponimod Fumarate TABS 2 mg (has no administration in time range)  oxybutynin (DITROPAN-XL) 24 hr tablet 10 mg (has no administration in time range)  baclofen (LIORESAL) tablet 20 mg (has no administration in time range)  gabapentin (NEURONTIN) capsule 100 mg (has no administration in time range)  tiZANidine (ZANAFLEX) tablet 8 mg (has no administration in time range)  enoxaparin (LOVENOX) injection 40 mg (has no administration in time range)  nitroGLYCERIN (NITROSTAT) SL tablet 0.4 mg (0.4 mg Sublingual Given 07/07/23 0744)  sodium chloride 0.9 % bolus 500 mL (0 mLs Intravenous Stopped 07/07/23 0959)  ondansetron (ZOFRAN) injection 4 mg (4 mg Intravenous Given 07/07/23 0749)  iohexol (OMNIPAQUE) 350 MG/ML injection 75 mL (75 mLs Intravenous Contrast Given 07/07/23 1304)  acetaminophen (TYLENOL) tablet 650 mg (650 mg Oral Given 07/07/23 1510)     IMPRESSION / MDM / ASSESSMENT AND PLAN / ED  COURSE  I reviewed the triage vital signs and the nursing notes. Patient's presentation is most consistent with acute presentation with potential threat to life or bodily function.  Patient presents with chest pressure, vomiting as above, differential includes ACS, dissection, gastritis/viral gastritis/esophagitis.  She does not describe any pain in her back and complains primarily of nausea but does have chest pressure  Will give sublingual nitroglycerin, 500 cc bag of normal saline, additional IV Zofran after evaluating QT on EKG  ----------------------------------------- 8:55 AM on 07/07/2023 ----------------------------------------- Mild elevation of white blood cell count is nonspecific, high sensitive troponin is normal, mild elevation in AST ALT alk phos and total bili noted, will send for right upper quadrant ultrasound  Patient reports she is feeling better, her heart rate is improved to 109 ----------------------------------------- 3:16 PM on 07/07/2023 ----------------------------------------- CT scans are reassuring, however she remains tachycardic and continues to have intermittent chest pressure.  I have consulted the hospitalist for admission     FINAL CLINICAL IMPRESSION(S) / ED DIAGNOSES   Final diagnoses:  Atypical chest pain     Rx / DC Orders   ED Discharge Orders     None        Note:  This document was prepared using Dragon voice recognition software and may include unintentional dictation errors.   Jene Every, MD 07/07/23 [phone]

## 2023-07-07 NOTE — H&P (Signed)
 History and Physical    Lisa Williams [id number] DOB: 04-May-1961 DOA: 07/07/2023  PCP: Lauro Regulus, MD  Patient coming from: home  I have personally briefly reviewed patient's old medical records in [facility]  Chief Complaint: fast heart rate and vomiting  HPI: Lisa Williams is a 63 y.o. female with medical history significant of MS, HTN, who presented with fast heart rate and vomiting.  Pt reported around 1am, she suddenly started feeling unwell, with heart racing.  Pt vomited several times since then.  Pt also felt chest tightness.  Pt was at her baseline health prior to this.  No fever, dyspnea, abdominal pain, diarrhea.  Pt stopped taking her phentermine about a week ago.  ED Course: initial vitals: afebrile, pulse 130 (sinus), BP 179/87, RR 18, sating 100% on room air.  Labs notable for mildly elevated LFT's, WBC 12.4, trop neg x2, CTA chest neg for PE, CT a/p with some fat stranding in the central small bowel mesentery, and large stool burden.  EGD showed sinus tach with rate of 127.  ED provider requested admission and cardiology consult for sinus tachycardia.   Assessment/Plan  Sinus tachycardia --workup in the ED found no source of infection, no PE, and no signs of dehydration. --cardio consult --monitor on tele  Vomiting likely 2/2  Constipation --CT a/p showed large stool burden. --aggressive Miralax  Hx of MS --pt reported MS well controlled currently. --cont home MAYZENT   HTN --cont home telmisartan as irbesartan  Obesity, BMI 36 --stopped taking phentermine about a week PTA   DVT prophylaxis: Lovenox SQ Code Status: Full code  Family Communication:   Disposition Plan: home  Consults called: cardiology Level of care: Med-Surg   Review of Systems: As per HPI otherwise complete review of systems negative.   Past Medical History:  Diagnosis Date   Abdominal pain, right upper quadrant    Back pain    Calculus of  kidney 12/09/2013   Chronic back pain    unspecified   Chronic left shoulder pain 07/19/2015   Complication of anesthesia    Functional disorder of bladder    other   Galactorrhea 11/26/2014   Chronic    Hereditary and idiopathic neuropathy 08/19/2013   History of kidney stones    HPV test positive    Hypercholesteremia 08/19/2013   Hypertension    Incomplete bladder emptying    Microscopic hematuria    MS (multiple sclerosis) (HCC)    Muscle spasticity 05/21/2014   Nonspecific findings on examination of urine    other   Osteopenia    PONV (postoperative nausea and vomiting)    Status post laparoscopic supracervical hysterectomy 11/26/2014   Tobacco user 11/26/2014   Wrist fracture     Past Surgical History:  Procedure Laterality Date   bilateral tubal ligation  1996   BREAST CYST EXCISION Left 2002   CYST EXCISION Left 05/10/2022   Procedure: CYST REMOVAL;  Surgeon: Carolan Shiver, MD;  Location: [facility] ORS;  Service: General;  Laterality: Left;   FRACTURE SURGERY     KNEE SURGERY     right   LAPAROSCOPIC SUPRACERVICAL HYSTERECTOMY  08/05/2013   ORIF WRIST FRACTURE Left 01/17/2017   Procedure: OPEN REDUCTION INTERNAL FIXATION (ORIF) WRIST FRACTURE;  Surgeon: Lyndle Herrlich, MD;  Location: [facility] ORS;  Service: Orthopedics;  Laterality: Left;   RADIOLOGY WITH ANESTHESIA N/A 03/18/2020   Procedure: MRI WITH ANESTHESIA CERVICAL SPINE AND BRAIN  WITH AND WITHOUT CONTRAST;  Surgeon: Radiologist,  Medication, MD;  Location: [facility] OR;  Service: Radiology;  Laterality: N/A;   TUBAL LIGATION Bilateral    VAGINAL HYSTERECTOMY  03/2006     reports that she has quit smoking. Her smoking use included cigarettes. She has never used smokeless tobacco. She reports that she does not drink alcohol and does not use drugs.  Allergies  Allergen Reactions   Amlodipine Swelling   Betadine [Povidone-Iodine]    Povidone Iodine Rash    Family History  Problem Relation Age of Onset   Sickle  cell trait Sister    Hypertension Sister    Supraventricular tachycardia Sister    Hypertension Sister    Diabetes Maternal Grandmother    Liver cancer Maternal Grandmother    Diabetes Maternal Aunt    Breast cancer Maternal Aunt        great MAT   Ovarian cancer Paternal Grandmother    Nephrolithiasis Father    Hypertension Father    Prostate cancer Father    Kidney disease Father    Heart failure Father    Hypertension Sister    Osteoarthritis Mother    Hypertension Mother    Hyperlipidemia Mother    GU problems Neg Hx    Urolithiasis Neg Hx     Prior to Admission medications   Medication Sig Start Date End Date Taking? Authorizing Provider  acetaminophen (TYLENOL) 500 MG tablet Take 500-1,000 mg by mouth every 6 (six) hours as needed for moderate pain or headache.    [provider]  baclofen (LIORESAL) 20 MG tablet Take 1 tablet (20 mg total) by mouth 3 (three) times daily. 02/28/23   Sater, Pearletha Furl, MD  Cholecalciferol 25 MCG (1000 UT) tablet Take 5,000 Units by mouth daily.    [provider]  gabapentin (NEURONTIN) 100 MG capsule Take 1 capsule (100 mg total) by mouth 2 (two) times daily. 02/22/23   Sater, Pearletha Furl, MD  methylPREDNISolone (MEDROL DOSEPAK) 4 MG TBPK tablet Take as directed Patient not taking: Reported on 02/22/2023 08/03/22   Darlin Priestly, MD  Multiple Vitamin (MULTIVITAMIN WITH MINERALS) TABS tablet Take 1 tablet by mouth daily. Patient not taking: Reported on 02/22/2023 08/03/22   Darlin Priestly, MD  oxybutynin (DITROPAN-XL) 10 MG 24 hr tablet Take 1 tablet (10 mg total) by mouth daily. 04/12/22   Vanna Scotland, MD  phentermine 37.5 MG capsule Take 1 capsule (37.5 mg total) by mouth every morning. 02/22/23   Sater, Pearletha Furl, MD  predniSONE (DELTASONE) 50 MG tablet Take 5 tablets (250 mg total) by mouth every 4 (four) hours. Patient not taking: Reported on 02/22/2023 08/02/22   Darlin Priestly, MD  Siponimod Fumarate (MAYZENT) 2 MG TABS Take 1 tablet  (2 mg total) by mouth daily. 03/12/23   Sater, Pearletha Furl, MD  telmisartan (MICARDIS) 40 MG tablet Take 40 mg by mouth daily.    [provider]  tiZANidine (ZANAFLEX) 4 MG tablet One po qAM, one po qPM and 2 po qHS 02/28/23   Asa Lente, MD    Physical Exam: Vitals:   07/07/23 1223 07/07/23 1250 07/07/23 1300 07/07/23 1353  BP:   (!) 179/105 (!) 149/89  Pulse:  (!) 121 (!) 125 (!) 118  Resp:  (!) 23  (!) 23  Temp: 98.4 F (36.9 C)     TempSrc: Oral     SpO2: 98%   98%  Weight:      Height:        Constitutional: NAD, AAOx3 HEENT: conjunctivae  and lids normal, EOMI CV: No cyanosis.   RESP: normal respiratory effort, on RA Neuro: II - XII grossly intact.   Psych: Normal mood and affect.  Appropriate judgement and reason  Labs on Admission: I have personally reviewed labs and imaging studies  Time spent: 60 minutes  Darlin Priestly MD [facility]  If 7PM-7AM, please contact night-coverage 07/07/2023, 3:25 PM

## 2023-07-07 NOTE — ED Notes (Signed)
 Sleeping

## 2023-07-07 NOTE — ED Notes (Signed)
 IV nurse at bedside for u/s IV for CTA.

## 2023-07-07 NOTE — ED Triage Notes (Signed)
 Pt in via [facility] from home c/o Chest pressure and vomiting since 1am this morning. Pt given 4mg  of zofran with EMS. No aspirin given  EMS Vitals: HR-134 207/113--has hx of high bp BS-133

## 2023-07-07 NOTE — ED Notes (Signed)
 Unable to get second troponin. Pt requests that lab be called to come draw.

## 2023-07-07 NOTE — ED Notes (Signed)
 BP reading better on LUE. Asked pt if having any chest pain or pain on inspiration. Pt states that her chest has been "tight all day", giving 8/10 rating. States whole chest has felt tight, not worse with inspiration. Denies SOB.

## 2023-07-07 NOTE — ED Notes (Signed)
 Family of pt expressing concern about pt's ongoing sinus tachycardia and stating that they had a friend who was recently discharged from the hospital and died. Also expressing concern about pt BP and asking if pt can have ice chips. Pt to MRI at this time. Attempted to provide reassurance to family and informed EDP of their concerns.

## 2023-07-07 NOTE — ED Notes (Signed)
 Henderson Cloud and Ginger ale given at patient request.

## 2023-07-07 NOTE — ED Notes (Signed)
 Pt requested purewick. Pt has very limited mobility with her MS and leg fx in 2024.

## 2023-07-07 NOTE — ED Notes (Signed)
 Pt is sleeping

## 2023-07-07 NOTE — ED Notes (Signed)
 Repositioned cuff to LUE. R wrist is reading high.

## 2023-07-08 DIAGNOSIS — R000 Tachycardia, unspecified: Secondary | ICD-10-CM | POA: Diagnosis not present

## 2023-07-08 LAB — MAGNESIUM: Magnesium: 2.1 mg/dL (ref 1.7–2.4)

## 2023-07-08 LAB — COMPREHENSIVE METABOLIC PANEL
ALT: 99 U/L — ABNORMAL HIGH (ref 0–44)
AST: 50 U/L — ABNORMAL HIGH (ref 15–41)
Albumin: 3.6 g/dL (ref 3.5–5.0)
Alkaline Phosphatase: 153 U/L — ABNORMAL HIGH (ref 38–126)
Anion gap: 8 (ref 5–15)
BUN: 31 mg/dL — ABNORMAL HIGH (ref 8–23)
CO2: 22 mmol/L (ref 22–32)
Calcium: 8.8 mg/dL — ABNORMAL LOW (ref 8.9–10.3)
Chloride: 108 mmol/L (ref 98–111)
Creatinine, Ser: 0.71 mg/dL (ref 0.44–1.00)
GFR, Estimated: >60 mL/min (ref >60)
Glucose, Bld: 117 mg/dL — ABNORMAL HIGH (ref 70–99)
Potassium: 3.8 mmol/L (ref 3.5–5.1)
Sodium: 138 mmol/L (ref 135–145)
Total Bilirubin: 1.1 mg/dL (ref 0.0–1.2)
Total Protein: 7.1 g/dL (ref 6.5–8.1)

## 2023-07-08 LAB — CBC
HCT: 39.4 % (ref 36.0–46.0)
Hemoglobin: 13.7 g/dL (ref 12.0–15.0)
MCH: 26.6 pg (ref 26.0–34.0)
MCHC: 34.8 g/dL (ref 30.0–36.0)
MCV: 76.4 fL — ABNORMAL LOW (ref 80.0–100.0)
Platelets: 239 10*3/uL (ref 150–400)
RBC: 5.16 MIL/uL — ABNORMAL HIGH (ref 3.87–5.11)
RDW: 14.8 % (ref 11.5–15.5)
WBC: 5.6 10*3/uL (ref 4.0–10.5)
nRBC: 0 % (ref 0.0–0.2)

## 2023-07-08 LAB — HIV ANTIBODY (ROUTINE TESTING W REFLEX): HIV Screen 4th Generation wRfx: NONREACTIVE

## 2023-07-08 MED ORDER — SMOG ENEMA
400.0000 mL | Freq: Once | RECTAL | Status: AC
  Administered 2023-07-08: 400 mL via RECTAL
  Filled 2023-07-08: qty 960

## 2023-07-08 MED ORDER — POLYETHYLENE GLYCOL 3350 17 G PO PACK
34.0000 g | PACK | ORAL | Status: AC
  Administered 2023-07-08 (×4): 34 g via ORAL
  Filled 2023-07-08 (×5): qty 2

## 2023-07-08 MED ORDER — TIZANIDINE HCL 4 MG PO TABS
4.0000 mg | ORAL_TABLET | Freq: Two times a day (BID) | ORAL | Status: DC
  Administered 2023-07-08 – 2023-07-09 (×2): 4 mg via ORAL
  Filled 2023-07-08 (×4): qty 1

## 2023-07-08 MED ORDER — SMOG ENEMA
960.0000 mL | Freq: Once | RECTAL | Status: AC
  Administered 2023-07-08: 960 mL via RECTAL
  Filled 2023-07-08: qty 960

## 2023-07-08 NOTE — Progress Notes (Signed)
  PROGRESS NOTE    Lisa Williams  [id number] DOB: 1961-02-03 DOA: 07/07/2023 PCP: Lauro Regulus, MD  [id number]  LOS: 0 days   Brief hospital course:   Assessment & Plan: Lisa Williams is a 63 y.o. female with medical history significant of MS, HTN, who presented with fast heart rate and vomiting.    Sinus tachycardia --workup in the ED found no source of infection, no PE, and no signs of dehydration. --cardio consulted, no further workup needed.   Vomiting likely 2/2  Constipation --CT a/p showed large stool burden. --Pt refused aggressive Miralax yesterday and didn't have a BM this morning. --aggressive Miralax --enema   Hx of MS --pt reported MS well controlled currently. --on home MAYZENT    HTN --cont home telmisartan as irbesartan   Obesity, BMI 36 --stopped taking phentermine about a week PTA  Leukocytosis --resolved without abx. --monitor   DVT prophylaxis: Lovenox SQ Code Status: Full code  Family Communication:  Level of care: Med-Surg Dispo:   The patient is from: home Anticipated d/c is to: home Anticipated d/c date is: tomorrow   Subjective and Interval History:  HR trended down.  Pt refused Miralax yesterday, so still didn't have a BM this morning.  Pt did not want to be discharged until her constipation is resolved.   Objective: Vitals:   07/08/23 0739 07/08/23 1123 07/08/23 1149 07/08/23 1431  BP: 118/62 (!) 136/50  139/82  Pulse: 88 91  91  Resp: 16 18  (!) 21  Temp: 98.6 F (37 C)  97.6 F (36.4 C) 98.7 F (37.1 C)  TempSrc: Oral  Oral Oral  SpO2: 98% 100%  100%  Weight:      Height:       No intake or output data in the 24 hours ending 07/08/23 1649 Filed Weights   07/07/23 0721  Weight: 118.8 kg    Examination:   Constitutional: NAD, AAOx3 HEENT: conjunctivae and lids normal, EOMI CV: No cyanosis.   RESP: normal respiratory effort, on RA Neuro: II - XII grossly intact.   Psych: Normal  mood and affect.  Appropriate judgement and reason   Data Reviewed: I have personally reviewed labs and imaging studies  Time spent: 35 minutes  Darlin Priestly, MD [facility] If 7PM-7AM, please contact night-coverage 07/08/2023, 4:49 PM

## 2023-07-08 NOTE — ED Notes (Signed)
 Report received from off going Nurse. Pt A&O x4. Resting quietly in bed. Rise and fall of chest present. No distress present. Bed low and locked, call light in reach.

## 2023-07-08 NOTE — Care Management Obs Status (Signed)
 MEDICARE OBSERVATION STATUS NOTIFICATION   Patient Details  Name: Lisa Williams MRN: [id number] Date of Birth: [birth year]   Medicare Observation Status Notification Given:  Yes    Renee Beale E Ataya Murdy, LCSW 07/08/2023, 12:56 PM

## 2023-07-08 NOTE — ED Notes (Signed)
 Advised nurse that patient has ready bed

## 2023-07-08 NOTE — ED Notes (Signed)
 Pt tolerated Enema well. Small results after enema. Md notified.

## 2023-07-08 NOTE — Consult Note (Addendum)
 Cardiology Consultation   Patient ID: Lisa Williams MRN: [id number]; DOB: 02-Nov-1960  Admit date: 07/07/2023 Date of Consult: 07/08/2023  PCP:  Lauro Regulus, MD   [facility] Providers Cardiologist:  None      New consult completed by Dr Graciela Husbands  Patient Profile:   Lisa Williams is a 63 y.o. female with a hx of hyperlipidemia, hypertension, relapsing remitting multiple sclerosis, neuropathy, tobacco abuse (quit 07/2016), osteoporosis, chronic back pain, who is being seen 07/08/2023 for the evaluation of sinus tachycardia at the request of Dr. Para March.  History of Present Illness:   Ms. Hargrove presented to the [facility] emergency department via EMS from home complaining of chest pressure and vomiting since 1 AM this morning.  She had previously suffered a leg fracture and a fall which was led to difficulty in ambulating and was attending PT and making steady progress.  She reports around 1230 this morning she started to feel nauseated and had vomiting and also reports chest pressure primarily when she lies down.  No prior history of coronary artery disease.  She also felt that her heart was racing.  She stated she was at her baseline health prior to this.  No fever, dyspnea, abdominal pain, diarrhea.  Of note she had recently stopped taking phentermine approximately a week prior.  Initial vital signs: Blood pressure 179/25, pulse of 125, respirations of 23  Pertinent labs: WBCs of 12.4, chloride 114, CO2 19, blood glucose 118, BUN of 29, calcium 8.8, AST 95, ALT 124, alkaline phos of 168, total bilirubin of 1.5, respiratory panel negative for COVID/flu/RSV by PCR, high-sensitivity troponins negative x 2  Imaging: Chest x-ray revealed bibasilar atelectasis; CTA of the chest abdomen and pelvis revealed negative for PE, fat stranding in the central small bowel mesentery measuring 3.9 x 1.9 cm, nonspecific and most likely benign infectious or inflammatory residual,  large burden of stool, mild cardiomegaly  Cardiology was consulted due to sinus tachycardia.   Past Medical History:  Diagnosis Date   Abdominal pain, right upper quadrant    Back pain    Calculus of kidney 12/09/2013   Chronic back pain    unspecified   Chronic left shoulder pain 07/19/2015   Complication of anesthesia    Functional disorder of bladder    other   Galactorrhea 11/26/2014   Chronic    Hereditary and idiopathic neuropathy 08/19/2013   History of kidney stones    HPV test positive    Hypercholesteremia 08/19/2013   Hypertension    Incomplete bladder emptying    Microscopic hematuria    MS (multiple sclerosis) (HCC)    Muscle spasticity 05/21/2014   Nonspecific findings on examination of urine    other   Osteopenia    PONV (postoperative nausea and vomiting)    Status post laparoscopic supracervical hysterectomy 11/26/2014   Tobacco user 11/26/2014   Wrist fracture     Past Surgical History:  Procedure Laterality Date   bilateral tubal ligation  1996   BREAST CYST EXCISION Left 2002   CYST EXCISION Left 05/10/2022   Procedure: CYST REMOVAL;  Surgeon: Carolan Shiver, MD;  Location: [facility] ORS;  Service: General;  Laterality: Left;   FRACTURE SURGERY     KNEE SURGERY     right   LAPAROSCOPIC SUPRACERVICAL HYSTERECTOMY  08/05/2013   ORIF WRIST FRACTURE Left 01/17/2017   Procedure: OPEN REDUCTION INTERNAL FIXATION (ORIF) WRIST FRACTURE;  Surgeon: Lyndle Herrlich, MD;  Location: [facility] ORS;  Service: Orthopedics;  Laterality: Left;   RADIOLOGY WITH ANESTHESIA N/A 03/18/2020   Procedure: MRI WITH ANESTHESIA CERVICAL SPINE AND BRAIN  WITH AND WITHOUT CONTRAST;  Surgeon: Radiologist, Medication, MD;  Location: [facility] OR;  Service: Radiology;  Laterality: N/A;   TUBAL LIGATION Bilateral    VAGINAL HYSTERECTOMY  03/2006     Home Medications:  Prior to Admission medications   Medication Sig Start Date End Date Taking? Authorizing Provider  acetaminophen (TYLENOL)  500 MG tablet Take 500-1,000 mg by mouth every 6 (six) hours as needed for moderate pain or headache.   Yes [provider]  baclofen (LIORESAL) 20 MG tablet Take 1 tablet (20 mg total) by mouth 3 (three) times daily. 02/28/23  Yes Sater, Pearletha Furl, MD  Cholecalciferol 25 MCG (1000 UT) tablet Take 5,000 Units by mouth daily.   Yes [provider]  gabapentin (NEURONTIN) 100 MG capsule Take 1 capsule (100 mg total) by mouth 2 (two) times daily. 02/22/23  Yes Sater, Pearletha Furl, MD  oxybutynin (DITROPAN-XL) 10 MG 24 hr tablet Take 1 tablet (10 mg total) by mouth daily. 04/12/22  Yes Vanna Scotland, MD  Siponimod Fumarate (MAYZENT) 2 MG TABS Take 1 tablet (2 mg total) by mouth daily. 03/12/23  Yes Sater, Pearletha Furl, MD  telmisartan (MICARDIS) 40 MG tablet Take 40 mg by mouth daily.   Yes [provider]  tiZANidine (ZANAFLEX) 4 MG tablet One po qAM, one po qPM and 2 po qHS 02/28/23  Yes Sater, Pearletha Furl, MD  Multiple Vitamin (MULTIVITAMIN WITH MINERALS) TABS tablet Take 1 tablet by mouth daily. Patient not taking: Reported on 02/22/2023 08/03/22   Darlin Priestly, MD  phentermine 37.5 MG capsule Take 1 capsule (37.5 mg total) by mouth every morning. 02/22/23   Sater, Pearletha Furl, MD    Inpatient Medications: Scheduled Meds:  baclofen  20 mg Oral TID   enoxaparin (LOVENOX) injection  0.5 mg/kg Subcutaneous Q24H   gabapentin  100 mg Oral BID   irbesartan  150 mg Oral Daily   oxybutynin  10 mg Oral Daily   Siponimod Fumarate  2 mg Oral Daily   tiZANidine  2 mg Oral BID   tiZANidine  4 mg Oral QHS   tiZANidine  8 mg Oral TID   Continuous Infusions:  PRN Meds: acetaminophen, hydrALAZINE, ondansetron (ZOFRAN) IV  Allergies:    Allergies  Allergen Reactions   Amlodipine Swelling   Betadine [Povidone-Iodine]    Povidone Iodine Rash    Social History:   Social History   Socioeconomic History   Marital status: Single    Spouse name: Not on file   Number of children: 0    Years of education: 14   Highest education level: Not on file  Occupational History   Not on file  Tobacco Use   Smoking status: Former    Current packs/day: 0.50    Types: Cigarettes   Smokeless tobacco: Never  Vaping Use   Vaping status: Never Used  Substance and Sexual Activity   Alcohol use: No    Alcohol/week: 0.0 standard drinks of alcohol   Drug use: No   Sexual activity: Not Currently    Birth control/protection: Surgical  Other Topics Concern   Not on file  Social History Narrative   Lives at home with mom and sister,. Has a walker for ambulation/uses WC when necessary   Right Handed   Drinks no caffeine   Social Drivers of Corporate investment banker Strain: Low Risk  (01/18/2023)  Received from [facility]   Overall Financial Resource Strain (CARDIA)    Difficulty of Paying Living Expenses: Not hard at all  Food Insecurity: No Food Insecurity (01/18/2023)   Received from [facility]   Hunger Vital Sign    Worried About Running Out of Food in the Last Year: Never true    Ran Out of Food in the Last Year: Never true  Transportation Needs: No Transportation Needs (01/18/2023)   Received from [facility]    In the past 12 months, has lack of transportation kept you from medical appointments or from getting medications?: No    Lack of Transportation (Non-Medical): No  Physical Activity: Not on file  Stress: Not on file  Social Connections: Not on file  Intimate Partner Violence: Not At Risk (07/29/2022)   Humiliation, Afraid, Rape, and Kick questionnaire    Fear of Current or Ex-Partner: No    Emotionally Abused: No    Physically Abused: No    Sexually Abused: No    Family History:    Family History  Problem Relation Age of Onset   Sickle cell trait Sister    Hypertension Sister    Supraventricular tachycardia Sister    Hypertension Sister    Diabetes Maternal Grandmother     Liver cancer Maternal Grandmother    Diabetes Maternal Aunt    Breast cancer Maternal Aunt        great MAT   Ovarian cancer Paternal Grandmother    Nephrolithiasis Father    Hypertension Father    Prostate cancer Father    Kidney disease Father    Heart failure Father    Hypertension Sister    Osteoarthritis Mother    Hypertension Mother    Hyperlipidemia Mother    GU problems Neg Hx    Urolithiasis Neg Hx      ROS:  Please see the history of present illness.  Review of Systems  Constitutional:  Positive for malaise/fatigue.  Cardiovascular:  Positive for chest pain and palpitations.  Gastrointestinal:  Positive for vomiting.  Neurological:  Positive for weakness.    All other ROS reviewed and negative.     Physical Exam/Data:   Vitals:   07/08/23 0330 07/08/23 0400 07/08/23 0629 07/08/23 0739  BP: 105/67   118/62  Pulse: (!) 104 87  88  Resp: (!) 21 15  16   Temp:   97.9 F (36.6 C) 98.6 F (37 C)  TempSrc:   Oral Oral  SpO2: 98% 100%  98%  Weight:      Height:        Intake/Output Summary (Last 24 hours) at 07/08/2023 0746 Last data filed at 07/07/2023 0959 Gross per 24 hour  Intake 500 ml  Output --  Net 500 ml      07/07/2023    7:21 AM 02/22/2023   11:11 AM 09/22/2022    9:17 AM  Last 3 Weights  Weight (lbs) 262 lb 263 lb 263 lb 0.1 oz  Weight (kg) 118.842 kg 119.296 kg 119.3 kg     Body mass index is 36.54 kg/m.  General:  Well nourished, well developed, in no acute distress HEENT: normal Neck: no JVD Vascular: No carotid bruits; Distal pulses 2+ bilaterally Cardiac:  normal S1, S2; RRR; tachycardic,no murmur  Lungs:  clear to auscultation bilaterally, no wheezing, rhonchi or rales  Abd: soft, nontender,obese, no hepatomegaly  Ext: trace pretibial edema Musculoskeletal:  No deformities,  BUE and BLE strength normal and equal Skin: warm and dry  Neuro:  CNs 2-12 intact, no focal abnormalities noted Psych:  Normal affect   EKG:  The EKG was  personally reviewed and demonstrates: Sinus tachycardia rate of 127 with LVH and borderline T wave abnormalities in diffuse leads Telemetry:  Telemetry was personally reviewed and demonstrates:  sinus to sinus tachycardia 80-100 bpm  Relevant CV Studies: 10/03/2015 TTE Study Conclusions - Left ventricle: There was mild concentric hypertrophy. Systolic   function was normal. The estimated ejection fraction was in the   range of 55% to 65%. - Aortic valve: Valve area (Vmax): 2.62 cm^2.   TTE 04/17/2018 Study Conclusions - Left ventricle: The cavity size was normal. Wall thickness was   increased increased in a pattern of mild to moderate LVH.   Systolic function was normal. The estimated ejection fraction was   in the range of 60% to 65%. Wall motion was normal; there were no   regional wall motion abnormalities. Doppler parameters are   consistent with abnormal left ventricular relaxation (grade 1   diastolic dysfunction). - Right ventricle: The cavity size was normal. Systolic function   was normal.  Laboratory Data:  High Sensitivity Troponin:   Recent Labs  Lab 07/07/23 0735 07/07/23 1023  TROPONINIHS 4 6     Chemistry Recent Labs  Lab 07/07/23 0735 07/08/23 0620  NA 143 138  K 4.8 3.8  CL 114* 108  CO2 19* 22  GLUCOSE 118* 117*  BUN 29* 31*  CREATININE 0.58 0.71  CALCIUM 8.8* 8.8*  MG  --  2.1  GFRNONAA >60 >60  ANIONGAP 10 8    Recent Labs  Lab 07/07/23 0735 07/08/23 0620  PROT 7.3 7.1  ALBUMIN 3.9 3.6  AST 95* 50*  ALT 124* 99*  ALKPHOS 168* 153*  BILITOT 1.5* 1.1   Lipids No results for input(s): "CHOL", "TRIG", "HDL", "LABVLDL", "LDLCALC", "CHOLHDL" in the last 168 hours.  Hematology Recent Labs  Lab 07/07/23 0735 07/08/23 0620  WBC 12.4* 5.6  RBC 5.51* 5.16*  HGB 14.7 13.7  HCT 42.7 39.4  MCV 77.5* 76.4*  MCH 26.7 26.6  MCHC 34.4 34.8  RDW 14.9 14.8  PLT 275 239   Thyroid No results for input(s): "TSH", "FREET4" in the last 168  hours.  BNPNo results for input(s): "BNP", "PROBNP" in the last 168 hours.  DDimer No results for input(s): "DDIMER" in the last 168 hours.   Radiology/Studies:  CT Angio Chest PE W and/or Wo Contrast Result Date: 07/07/2023 CLINICAL DATA:  Abdominal pain, chest pressure and vomiting since 1 a.m. this morning PE suspected EXAM: CT ANGIOGRAPHY CHEST CT ABDOMEN AND PELVIS WITH CONTRAST TECHNIQUE: Multidetector CT imaging of the chest was performed using the standard protocol during bolus administration of intravenous contrast. Multiplanar CT image reconstructions and MIPs were obtained to evaluate the vascular anatomy. Multidetector CT imaging of the abdomen and pelvis was performed using the standard protocol during bolus administration of intravenous contrast. RADIATION DOSE REDUCTION: This exam was performed according to the departmental dose-optimization program which includes automated exposure control, adjustment of the mA and/or kV according to patient size and/or use of iterative reconstruction technique. CONTRAST:  75mL OMNIPAQUE IOHEXOL 350 MG/ML SOLN COMPARISON:  Right upper quadrant ultrasound, 07/07/2023, CT abdomen pelvis, 03/11/2009 FINDINGS: CT CHEST ANGIOGRAM FINDINGS Cardiovascular: Satisfactory opacification of the pulmonary arteries to the segmental level. No evidence of pulmonary embolism. Mild cardiomegaly. No pericardial effusion. Mediastinum/Nodes: No enlarged mediastinal, hilar, or  axillary lymph nodes. Thyroid gland, trachea, and esophagus demonstrate no significant findings. Lungs/Pleura: Elevation of the hemidiaphragms with associated bibasilar scarring or atelectasis. No pleural effusion or pneumothorax. Musculoskeletal: No chest wall abnormality. No acute osseous findings. Review of the MIP images confirms the above findings. CT ABDOMEN PELVIS FINDINGS Hepatobiliary: No solid liver abnormality is seen. No gallstones, gallbladder wall thickening, or biliary dilatation. Pancreas:  Unremarkable. No pancreatic ductal dilatation or surrounding inflammatory changes. Spleen: Normal in size without significant abnormality. Adrenals/Urinary Tract: Benign small left adrenal adenomata, requiring no further follow-up or characterization. Kidneys are normal, without renal calculi, solid lesion, or hydronephrosis. Bladder is unremarkable. Stomach/Bowel: Stomach is within normal limits. Appendix appears normal. No evidence of bowel wall thickening, distention, or inflammatory changes. Large burden of stool throughout the colon. Vascular/Lymphatic: Aortic atherosclerosis. No enlarged abdominal or pelvic lymph nodes. Reproductive: No mass or other significant abnormality. Other: No abdominal wall hernia or abnormality. No ascites. Fat stranding in the central small bowel mesentery measuring 3.9 x 1.9 cm (series 5, image 66). Musculoskeletal: No acute or significant osseous findings. IMPRESSION: 1. Negative examination for pulmonary embolism. 2. Fat stranding in the central small bowel mesentery measuring 3.9 x 1.9 cm, nonspecific and most likely benign infectious or inflammatory residua. 3. Large burden of stool. 4. Mild cardiomegaly. Aortic Atherosclerosis (ICD10-I70.0). Electronically Signed   By: Jearld Lesch M.D.   On: 07/07/2023 14:04   CT ABDOMEN PELVIS W CONTRAST Result Date: 07/07/2023 CLINICAL DATA:  Abdominal pain, chest pressure and vomiting since 1 a.m. this morning PE suspected EXAM: CT ANGIOGRAPHY CHEST CT ABDOMEN AND PELVIS WITH CONTRAST TECHNIQUE: Multidetector CT imaging of the chest was performed using the standard protocol during bolus administration of intravenous contrast. Multiplanar CT image reconstructions and MIPs were obtained to evaluate the vascular anatomy. Multidetector CT imaging of the abdomen and pelvis was performed using the standard protocol during bolus administration of intravenous contrast. RADIATION DOSE REDUCTION: This exam was performed according to the  departmental dose-optimization program which includes automated exposure control, adjustment of the mA and/or kV according to patient size and/or use of iterative reconstruction technique. CONTRAST:  75mL OMNIPAQUE IOHEXOL 350 MG/ML SOLN COMPARISON:  Right upper quadrant ultrasound, 07/07/2023, CT abdomen pelvis, 03/11/2009 FINDINGS: CT CHEST ANGIOGRAM FINDINGS Cardiovascular: Satisfactory opacification of the pulmonary arteries to the segmental level. No evidence of pulmonary embolism. Mild cardiomegaly. No pericardial effusion. Mediastinum/Nodes: No enlarged mediastinal, hilar, or axillary lymph nodes. Thyroid gland, trachea, and esophagus demonstrate no significant findings. Lungs/Pleura: Elevation of the hemidiaphragms with associated bibasilar scarring or atelectasis. No pleural effusion or pneumothorax. Musculoskeletal: No chest wall abnormality. No acute osseous findings. Review of the MIP images confirms the above findings. CT ABDOMEN PELVIS FINDINGS Hepatobiliary: No solid liver abnormality is seen. No gallstones, gallbladder wall thickening, or biliary dilatation. Pancreas: Unremarkable. No pancreatic ductal dilatation or surrounding inflammatory changes. Spleen: Normal in size without significant abnormality. Adrenals/Urinary Tract: Benign small left adrenal adenomata, requiring no further follow-up or characterization. Kidneys are normal, without renal calculi, solid lesion, or hydronephrosis. Bladder is unremarkable. Stomach/Bowel: Stomach is within normal limits. Appendix appears normal. No evidence of bowel wall thickening, distention, or inflammatory changes. Large burden of stool throughout the colon. Vascular/Lymphatic: Aortic atherosclerosis. No enlarged abdominal or pelvic lymph nodes. Reproductive: No mass or other significant abnormality. Other: No abdominal wall hernia or abnormality. No ascites. Fat stranding in the central small bowel mesentery measuring 3.9 x 1.9 cm (series 5, image 66).  Musculoskeletal: No acute or significant osseous findings. IMPRESSION:  1. Negative examination for pulmonary embolism. 2. Fat stranding in the central small bowel mesentery measuring 3.9 x 1.9 cm, nonspecific and most likely benign infectious or inflammatory residua. 3. Large burden of stool. 4. Mild cardiomegaly. Aortic Atherosclerosis (ICD10-I70.0). Electronically Signed   By: Jearld Lesch M.D.   On: 07/07/2023 14:04   US Abdomen Limited RUQ (LIVER/GB) Result Date: 07/07/2023 CLINICAL DATA:  Upper abdominal pain EXAM: ULTRASOUND ABDOMEN LIMITED RIGHT UPPER QUADRANT COMPARISON:  12/22/2015 FINDINGS: Gallbladder: The gallbladder is not confidently identified. The technologist labels an anechoic structure that has the gallbladder, but this may represent perihepatic small volume fluid. Common bile duct: Diameter: Normal, 2 mm.  No intrahepatic duct dilatation identified. Liver: No gross abnormality. The left hepatic lobe is not well visualized. Portal vein is patent on color Doppler imaging with normal direction of blood flow towards the liver. Other: Limited exam secondary to overlying bowel gas and patient's body habitus. IMPRESSION: Suboptimal exam as detailed above. Gallbladder not confidently identified. Favor small volume perihepatic ascites. Consider further evaluation with CT if clinically indicated. Electronically Signed   By: Jeronimo Greaves M.D.   On: 07/07/2023 10:24   DG Chest 2 View Result Date: 07/07/2023 CLINICAL DATA:  Chest pain EXAM: CHEST - 2 VIEW COMPARISON:  07/07/2023 FINDINGS: Normal heart size. No pleural fluid, interstitial edema or airspace consolidation. Bibasilar atelectasis noted. Unchanged asymmetric elevation of the right hemidiaphragm. The visualized osseous structures are unremarkable. IMPRESSION: Bibasilar atelectasis. Electronically Signed   By: Signa Kell M.D.   On: 07/07/2023 09:08     Assessment and Plan:   Sinus tachycardia -causing chest tightness that has  resolved -high sensitivity troponins negative x 2 -has improved overnight -CTA chest negative for PE -ED work-up without source of infection, dehydration -continue on telemetry monitoring  Hypertension -blood pressure 118/62 -continued on irbesartan -vital signs per unit protocol  History of MS -not currently in a flare -ongoing management per IM  Vomiting likely secondary to constipation -resolved -given several laxatives for fecal impaction -ongoing management per IM  Obesity -BMI 36.54 -makes prognosis difficult -recently stopped phentermine -has been working with physical therapy for recent leg fracture  Risk Assessment/Risk Scores:                For questions or updates, please contact [facility] Please consult [email or web address] for contact info under    Signed, SHERI HAMMOCK, NP  07/08/2023 7:46 AM  Sinus tachycardia  Hypertension  Multiple sclerosis  Vomiting thought secondary to constipation   Patient was admitted with constipation and presumed as a cause of secondary vomiting.  Elevated BUN/creatinine ratio there is also a significant hemoconcentration from abnormal hemoglobin of 13 to almost 15.  Noted to have a decreased heart rate as well as increased blood pressure (117//152/68) with a gradual decrease over the last 24 hours to a heart rate in the mid-high 80s and the blood pressure in the 110s range ECGs were consistent with sinus tachycardia with P wave morphologies with anticipated factors.  Clinical course  This is almost consistent with intravascular depletion and secondary sympathetic stress associated with the vomiting.  Based on the current findings, normal troponin etc. this is presumably secondary sinus tachycardia.  No further cardiac evaluation is indicated.  Please call if we can be of further assistance.

## 2023-07-09 ENCOUNTER — Observation Stay

## 2023-07-09 DIAGNOSIS — K5900 Constipation, unspecified: Secondary | ICD-10-CM | POA: Diagnosis not present

## 2023-07-09 DIAGNOSIS — R000 Tachycardia, unspecified: Secondary | ICD-10-CM | POA: Diagnosis not present

## 2023-07-09 MED ORDER — LACTULOSE 10 GM/15ML PO SOLN: 40.0000 g | Freq: Once | ORAL | Status: DC

## 2023-07-09 MED ORDER — MAGNESIUM CITRATE PO SOLN
1.0000 | Freq: Once | ORAL | Status: AC
  Administered 2023-07-09: 1 via ORAL
  Filled 2023-07-09: qty 296

## 2023-07-09 MED ORDER — TIZANIDINE HCL 4 MG PO TABS: 4.0000 mg | ORAL_TABLET | Freq: Two times a day (BID) | ORAL | Status: DC

## 2023-07-09 MED ORDER — POLYETHYLENE GLYCOL 3350 17 G PO PACK: 17.0000 g | PACK | Freq: Two times a day (BID) | ORAL | Status: DC

## 2023-07-09 NOTE — Discharge Summary (Signed)
 Physician Discharge Summary   Lisa Williams  female DOB: 1961-04-04  ZOX:[id number]  PCP: Lauro Regulus, MD  Admit date: 07/07/2023 Discharge date: 07/09/2023  Admitted From: home Disposition:  home CODE STATUS: Full code   Hospital Course:  For full details, please see H&P, progress notes, consult notes and ancillary notes.  Briefly,  Lisa Williams is a 63 y.o. female with medical history significant of MS, HTN, who presented with fast heart rate and vomiting.    Sinus tachycardia --workup in the ED found no source of infection, no PE, and no signs of dehydration. --cardio consulted, no further workup needed.   Vomiting likely 2/2  Constipation --CT a/p showed large stool burden. --Pt refused aggressive Miralax initially, but took it the next day as well as enema x2 resulting in multiple BM's and resolution of constipation. --pt discharged on scheduled Miralax   Hx of MS --pt reported MS well controlled currently. --on home MAYZENT    HTN --cont home telmisartan    Obesity, BMI 36 --stopped taking phentermine about a week PTA   Leukocytosis --resolved without abx.   Unless noted above, medications under "STOP" list are ones pt was not taking PTA.  Discharge Diagnoses:  Principal Problem:   Sinus tachycardia     Discharge Instructions:  Allergies as of 07/09/2023       Reactions   Amlodipine Swelling   Betadine [povidone-iodine]    Povidone Iodine Rash        Medication List     STOP taking these medications    multivitamin with minerals Tabs tablet       TAKE these medications    acetaminophen 500 MG tablet Commonly known as: TYLENOL Take 500-1,000 mg by mouth every 6 (six) hours as needed for moderate pain or headache.   baclofen 20 MG tablet Commonly known as: LIORESAL Take 1 tablet (20 mg total) by mouth 3 (three) times daily.   Cholecalciferol 25 MCG (1000 UT) tablet Take 5,000 Units by mouth daily.    gabapentin 100 MG capsule Commonly known as: NEURONTIN Take 1 capsule (100 mg total) by mouth 2 (two) times daily.   Mayzent 2 MG Tabs Generic drug: Siponimod Fumarate Take 1 tablet (2 mg total) by mouth daily.   oxybutynin 10 MG 24 hr tablet Commonly known as: DITROPAN-XL Take 1 tablet (10 mg total) by mouth daily.   phentermine 37.5 MG capsule Take 1 capsule (37.5 mg total) by mouth every morning.   polyethylene glycol 17 g packet Commonly known as: MiraLax Take 17 g by mouth 2 (two) times daily.   telmisartan 40 MG tablet Commonly known as: MICARDIS Take 40 mg by mouth daily.   tiZANidine 4 MG tablet Commonly known as: ZANAFLEX Take 1 tablet (4 mg total) by mouth 2 (two) times daily. Home med and dose. What changed:  how much to take how to take this when to take this additional instructions         Follow-up Information     Lauro Regulus, MD Follow up in 1 week(s).   Specialty: Internal Medicine Contact information: [address] [place] [address] [phone]                 Allergies  Allergen Reactions   Amlodipine Swelling   Betadine [Povidone-Iodine]    Povidone Iodine Rash     The results of significant diagnostics from this hospitalization (including imaging, microbiology, ancillary and laboratory) are listed below for reference.  Consultations:   Procedures/Studies: DG Abd 1 View Result Date: 07/09/2023 CLINICAL DATA:  Constipation. EXAM: ABDOMEN - 1 VIEW COMPARISON:  None Available. FINDINGS: No evidence of dilated bowel loops. Small amount of stool seen rectosigmoid colon. No radiopaque calculi identified IMPRESSION: Unremarkable bowel gas pattern.  Small stool burden noted. Electronically Signed   By: Danae Orleans M.D.   On: 07/09/2023 13:24   CT Angio Chest PE W and/or Wo Contrast Result Date: 07/07/2023 CLINICAL DATA:  Abdominal pain, chest pressure and vomiting since 1 a.m. this morning PE suspected EXAM: CT  ANGIOGRAPHY CHEST CT ABDOMEN AND PELVIS WITH CONTRAST TECHNIQUE: Multidetector CT imaging of the chest was performed using the standard protocol during bolus administration of intravenous contrast. Multiplanar CT image reconstructions and MIPs were obtained to evaluate the vascular anatomy. Multidetector CT imaging of the abdomen and pelvis was performed using the standard protocol during bolus administration of intravenous contrast. RADIATION DOSE REDUCTION: This exam was performed according to the departmental dose-optimization program which includes automated exposure control, adjustment of the mA and/or kV according to patient size and/or use of iterative reconstruction technique. CONTRAST:  75mL OMNIPAQUE IOHEXOL 350 MG/ML SOLN COMPARISON:  Right upper quadrant ultrasound, 07/07/2023, CT abdomen pelvis, 03/11/2009 FINDINGS: CT CHEST ANGIOGRAM FINDINGS Cardiovascular: Satisfactory opacification of the pulmonary arteries to the segmental level. No evidence of pulmonary embolism. Mild cardiomegaly. No pericardial effusion. Mediastinum/Nodes: No enlarged mediastinal, hilar, or axillary lymph nodes. Thyroid gland, trachea, and esophagus demonstrate no significant findings. Lungs/Pleura: Elevation of the hemidiaphragms with associated bibasilar scarring or atelectasis. No pleural effusion or pneumothorax. Musculoskeletal: No chest wall abnormality. No acute osseous findings. Review of the MIP images confirms the above findings. CT ABDOMEN PELVIS FINDINGS Hepatobiliary: No solid liver abnormality is seen. No gallstones, gallbladder wall thickening, or biliary dilatation. Pancreas: Unremarkable. No pancreatic ductal dilatation or surrounding inflammatory changes. Spleen: Normal in size without significant abnormality. Adrenals/Urinary Tract: Benign small left adrenal adenomata, requiring no further follow-up or characterization. Kidneys are normal, without renal calculi, solid lesion, or hydronephrosis. Bladder is  unremarkable. Stomach/Bowel: Stomach is within normal limits. Appendix appears normal. No evidence of bowel wall thickening, distention, or inflammatory changes. Large burden of stool throughout the colon. Vascular/Lymphatic: Aortic atherosclerosis. No enlarged abdominal or pelvic lymph nodes. Reproductive: No mass or other significant abnormality. Other: No abdominal wall hernia or abnormality. No ascites. Fat stranding in the central small bowel mesentery measuring 3.9 x 1.9 cm (series 5, image 66). Musculoskeletal: No acute or significant osseous findings. IMPRESSION: 1. Negative examination for pulmonary embolism. 2. Fat stranding in the central small bowel mesentery measuring 3.9 x 1.9 cm, nonspecific and most likely benign infectious or inflammatory residua. 3. Large burden of stool. 4. Mild cardiomegaly. Aortic Atherosclerosis (ICD10-I70.0). Electronically Signed   By: Jearld Lesch M.D.   On: 07/07/2023 14:04   CT ABDOMEN PELVIS W CONTRAST Result Date: 07/07/2023 CLINICAL DATA:  Abdominal pain, chest pressure and vomiting since 1 a.m. this morning PE suspected EXAM: CT ANGIOGRAPHY CHEST CT ABDOMEN AND PELVIS WITH CONTRAST TECHNIQUE: Multidetector CT imaging of the chest was performed using the standard protocol during bolus administration of intravenous contrast. Multiplanar CT image reconstructions and MIPs were obtained to evaluate the vascular anatomy. Multidetector CT imaging of the abdomen and pelvis was performed using the standard protocol during bolus administration of intravenous contrast. RADIATION DOSE REDUCTION: This exam was performed according to the departmental dose-optimization program which includes automated exposure control, adjustment of the mA and/or kV according to patient size and/or  use of iterative reconstruction technique. CONTRAST:  75mL OMNIPAQUE IOHEXOL 350 MG/ML SOLN COMPARISON:  Right upper quadrant ultrasound, 07/07/2023, CT abdomen pelvis, 03/11/2009 FINDINGS: CT CHEST  ANGIOGRAM FINDINGS Cardiovascular: Satisfactory opacification of the pulmonary arteries to the segmental level. No evidence of pulmonary embolism. Mild cardiomegaly. No pericardial effusion. Mediastinum/Nodes: No enlarged mediastinal, hilar, or axillary lymph nodes. Thyroid gland, trachea, and esophagus demonstrate no significant findings. Lungs/Pleura: Elevation of the hemidiaphragms with associated bibasilar scarring or atelectasis. No pleural effusion or pneumothorax. Musculoskeletal: No chest wall abnormality. No acute osseous findings. Review of the MIP images confirms the above findings. CT ABDOMEN PELVIS FINDINGS Hepatobiliary: No solid liver abnormality is seen. No gallstones, gallbladder wall thickening, or biliary dilatation. Pancreas: Unremarkable. No pancreatic ductal dilatation or surrounding inflammatory changes. Spleen: Normal in size without significant abnormality. Adrenals/Urinary Tract: Benign small left adrenal adenomata, requiring no further follow-up or characterization. Kidneys are normal, without renal calculi, solid lesion, or hydronephrosis. Bladder is unremarkable. Stomach/Bowel: Stomach is within normal limits. Appendix appears normal. No evidence of bowel wall thickening, distention, or inflammatory changes. Large burden of stool throughout the colon. Vascular/Lymphatic: Aortic atherosclerosis. No enlarged abdominal or pelvic lymph nodes. Reproductive: No mass or other significant abnormality. Other: No abdominal wall hernia or abnormality. No ascites. Fat stranding in the central small bowel mesentery measuring 3.9 x 1.9 cm (series 5, image 66). Musculoskeletal: No acute or significant osseous findings. IMPRESSION: 1. Negative examination for pulmonary embolism. 2. Fat stranding in the central small bowel mesentery measuring 3.9 x 1.9 cm, nonspecific and most likely benign infectious or inflammatory residua. 3. Large burden of stool. 4. Mild cardiomegaly. Aortic Atherosclerosis  (ICD10-I70.0). Electronically Signed   By: Jearld Lesch M.D.   On: 07/07/2023 14:04   US Abdomen Limited RUQ (LIVER/GB) Result Date: 07/07/2023 CLINICAL DATA:  Upper abdominal pain EXAM: ULTRASOUND ABDOMEN LIMITED RIGHT UPPER QUADRANT COMPARISON:  12/22/2015 FINDINGS: Gallbladder: The gallbladder is not confidently identified. The technologist labels an anechoic structure that has the gallbladder, but this may represent perihepatic small volume fluid. Common bile duct: Diameter: Normal, 2 mm.  No intrahepatic duct dilatation identified. Liver: No gross abnormality. The left hepatic lobe is not well visualized. Portal vein is patent on color Doppler imaging with normal direction of blood flow towards the liver. Other: Limited exam secondary to overlying bowel gas and patient's body habitus. IMPRESSION: Suboptimal exam as detailed above. Gallbladder not confidently identified. Favor small volume perihepatic ascites. Consider further evaluation with CT if clinically indicated. Electronically Signed   By: Jeronimo Greaves M.D.   On: 07/07/2023 10:24   DG Chest 2 View Result Date: 07/07/2023 CLINICAL DATA:  Chest pain EXAM: CHEST - 2 VIEW COMPARISON:  07/07/2023 FINDINGS: Normal heart size. No pleural fluid, interstitial edema or airspace consolidation. Bibasilar atelectasis noted. Unchanged asymmetric elevation of the right hemidiaphragm. The visualized osseous structures are unremarkable. IMPRESSION: Bibasilar atelectasis. Electronically Signed   By: Signa Kell M.D.   On: 07/07/2023 09:08      Labs: BNP (last 3 results) No results for input(s): "BNP" in the last 8760 hours. Basic Metabolic Panel: Recent Labs  Lab 07/07/23 0735 07/08/23 0620  NA 143 138  K 4.8 3.8  CL 114* 108  CO2 19* 22  GLUCOSE 118* 117*  BUN 29* 31*  CREATININE 0.58 0.71  CALCIUM 8.8* 8.8*  MG  --  2.1   Liver Function Tests: Recent Labs  Lab 07/07/23 0735 07/08/23 0620  AST 95* 50*  ALT 124* 99*  ALKPHOS 168*  153*   BILITOT 1.5* 1.1  PROT 7.3 7.1  ALBUMIN 3.9 3.6   Recent Labs  Lab 07/07/23 0735  LIPASE 24   No results for input(s): "AMMONIA" in the last 168 hours. CBC: Recent Labs  Lab 07/07/23 0735 07/08/23 0620  WBC 12.4* 5.6  HGB 14.7 13.7  HCT 42.7 39.4  MCV 77.5* 76.4*  PLT 275 239   Cardiac Enzymes: No results for input(s): "CKTOTAL", "CKMB", "CKMBINDEX", "TROPONINI" in the last 168 hours. BNP: Invalid input(s): "POCBNP" CBG: No results for input(s): "GLUCAP" in the last 168 hours. D-Dimer No results for input(s): "DDIMER" in the last 72 hours. Hgb A1c No results for input(s): "HGBA1C" in the last 72 hours. Lipid Profile No results for input(s): "CHOL", "HDL", "LDLCALC", "TRIG", "CHOLHDL", "LDLDIRECT" in the last 72 hours. Thyroid function studies No results for input(s): "TSH", "T4TOTAL", "T3FREE", "THYROIDAB" in the last 72 hours.  Invalid input(s): "FREET3" Anemia work up No results for input(s): "VITAMINB12", "FOLATE", "FERRITIN", "TIBC", "IRON", "RETICCTPCT" in the last 72 hours. Urinalysis    Component Value Date/Time   COLORURINE YELLOW (A) 07/31/2022 0550   APPEARANCEUR CLOUDY (A) 07/31/2022 0550   APPEARANCEUR Clear 03/03/2021 1128   LABSPEC 1.032 (H) 07/31/2022 0550   LABSPEC 1.030 07/31/2011 1838   PHURINE 5.0 07/31/2022 0550   GLUCOSEU NEGATIVE 07/31/2022 0550   GLUCOSEU Negative 07/31/2011 1838   HGBUR NEGATIVE 07/31/2022 0550   BILIRUBINUR NEGATIVE 07/31/2022 0550   BILIRUBINUR Negative 03/03/2021 1128   BILIRUBINUR Negative 07/31/2011 1838   KETONESUR NEGATIVE 07/31/2022 0550   PROTEINUR NEGATIVE 07/31/2022 0550   NITRITE NEGATIVE 07/31/2022 0550   LEUKOCYTESUR NEGATIVE 07/31/2022 0550   LEUKOCYTESUR Negative 07/31/2011 1838   Sepsis Labs Recent Labs  Lab 07/07/23 0735 07/08/23 0620  WBC 12.4* 5.6   Microbiology Recent Results (from the past 240 hours)  Resp panel by RT-PCR (RSV, Flu A&B, Covid) Anterior Nasal Swab     Status: None    Collection Time: 07/07/23  7:34 AM   Specimen: Anterior Nasal Swab  Result Value Ref Range Status   SARS Coronavirus 2 by RT PCR NEGATIVE NEGATIVE Final    Comment: (NOTE) SARS-CoV-2 target nucleic acids are NOT DETECTED.  The SARS-CoV-2 RNA is generally detectable in upper respiratory specimens during the acute phase of infection. The lowest concentration of SARS-CoV-2 viral copies this assay can detect is 138 copies/mL. A negative result does not preclude SARS-Cov-2 infection and should not be used as the sole basis for treatment or other patient management decisions. A negative result may occur with  improper specimen collection/handling, submission of specimen other than nasopharyngeal swab, presence of viral mutation(s) within the areas targeted by this assay, and inadequate number of viral copies(<138 copies/mL). A negative result must be combined with clinical observations, patient history, and epidemiological information. The expected result is Negative.  Fact Sheet for Patients:  BloggerCourse.com  Fact Sheet for Healthcare Providers:  SeriousBroker.it  This test is no t yet approved or cleared by the [place] FDA and  has been authorized for detection and/or diagnosis of SARS-CoV-2 by FDA under an Emergency Use Authorization (EUA). This EUA will remain  in effect (meaning this test can be used) for the duration of the COVID-19 declaration under Section 564(b)(1) of the Act, 21 U.S.C.section 360bbb-3(b)(1), unless the authorization is terminated  or revoked sooner.       Influenza A by PCR NEGATIVE NEGATIVE Final   Influenza B by PCR NEGATIVE NEGATIVE Final    Comment: (NOTE) The  Xpert Xpress SARS-CoV-2/FLU/RSV plus assay is intended as an aid in the diagnosis of influenza from Nasopharyngeal swab specimens and should not be used as a sole basis for treatment. Nasal washings and aspirates are unacceptable for  Xpert Xpress SARS-CoV-2/FLU/RSV testing.  Fact Sheet for Patients: BloggerCourse.com  Fact Sheet for Healthcare Providers: SeriousBroker.it  This test is not yet approved or cleared by the [place] FDA and has been authorized for detection and/or diagnosis of SARS-CoV-2 by FDA under an Emergency Use Authorization (EUA). This EUA will remain in effect (meaning this test can be used) for the duration of the COVID-19 declaration under Section 564(b)(1) of the Act, 21 U.S.C. section 360bbb-3(b)(1), unless the authorization is terminated or revoked.     Resp Syncytial Virus by PCR NEGATIVE NEGATIVE Final    Comment: (NOTE) Fact Sheet for Patients: BloggerCourse.com  Fact Sheet for Healthcare Providers: SeriousBroker.it  This test is not yet approved or cleared by the [place] FDA and has been authorized for detection and/or diagnosis of SARS-CoV-2 by FDA under an Emergency Use Authorization (EUA). This EUA will remain in effect (meaning this test can be used) for the duration of the COVID-19 declaration under Section 564(b)(1) of the Act, 21 U.S.C. section 360bbb-3(b)(1), unless the authorization is terminated or revoked.  Performed at [facility], [address] [place], [place] [address]      Total time spend on discharging this patient, including the last patient exam, discussing the hospital stay, instructions for ongoing care as it relates to all pertinent caregivers, as well as preparing the medical discharge records, prescriptions, and/or referrals as applicable, is 35 minutes.    Darlin Priestly, MD  [facility] 07/09/2023, 2:47 PM

## 2023-07-09 NOTE — Plan of Care (Signed)
  Problem: Education: Goal: Knowledge of General Education information will improve Description: Including pain rating scale, medication(s)/side effects and non-pharmacologic comfort measures 07/09/2023 1738 by Oneita Kras, RN Outcome: Adequate for Discharge 07/09/2023 1738 by Oneita Kras, RN Outcome: Progressing   Problem: Health Behavior/Discharge Planning: Goal: Ability to manage health-related needs will improve 07/09/2023 1738 by Oneita Kras, RN Outcome: Adequate for Discharge 07/09/2023 1738 by Oneita Kras, RN Outcome: Progressing   Problem: Clinical Measurements: Goal: Ability to maintain clinical measurements within normal limits will improve 07/09/2023 1738 by Oneita Kras, RN Outcome: Adequate for Discharge 07/09/2023 1738 by Oneita Kras, RN Outcome: Progressing Goal: Will remain free from infection 07/09/2023 1738 by Oneita Kras, RN Outcome: Adequate for Discharge 07/09/2023 1738 by Oneita Kras, RN Outcome: Progressing Goal: Diagnostic test results will improve 07/09/2023 1738 by Oneita Kras, RN Outcome: Adequate for Discharge 07/09/2023 1738 by Oneita Kras, RN Outcome: Progressing Goal: Respiratory complications will improve 07/09/2023 1738 by Oneita Kras, RN Outcome: Adequate for Discharge 07/09/2023 1738 by Oneita Kras, RN Outcome: Progressing Goal: Cardiovascular complication will be avoided 07/09/2023 1738 by Oneita Kras, RN Outcome: Adequate for Discharge 07/09/2023 1738 by Oneita Kras, RN Outcome: Progressing   Problem: Activity: Goal: Risk for activity intolerance will decrease 07/09/2023 1738 by Oneita Kras, RN Outcome: Adequate for Discharge 07/09/2023 1738 by Oneita Kras, RN Outcome: Progressing   Problem: Nutrition: Goal: Adequate nutrition will be maintained 07/09/2023 1738 by Oneita Kras, RN Outcome:  Adequate for Discharge 07/09/2023 1738 by Oneita Kras, RN Outcome: Progressing   Problem: Coping: Goal: Level of anxiety will decrease 07/09/2023 1738 by Oneita Kras, RN Outcome: Adequate for Discharge 07/09/2023 1738 by Oneita Kras, RN Outcome: Progressing   Problem: Elimination: Goal: Will not experience complications related to bowel motility 07/09/2023 1738 by Oneita Kras, RN Outcome: Adequate for Discharge 07/09/2023 1738 by Oneita Kras, RN Outcome: Progressing Goal: Will not experience complications related to urinary retention 07/09/2023 1738 by Oneita Kras, RN Outcome: Adequate for Discharge 07/09/2023 1738 by Oneita Kras, RN Outcome: Progressing   Problem: Pain Managment: Goal: General experience of comfort will improve and/or be controlled 07/09/2023 1738 by Oneita Kras, RN Outcome: Adequate for Discharge 07/09/2023 1738 by Oneita Kras, RN Outcome: Progressing   Problem: Safety: Goal: Ability to remain free from injury will improve 07/09/2023 1738 by Oneita Kras, RN Outcome: Adequate for Discharge 07/09/2023 1738 by Oneita Kras, RN Outcome: Progressing   Problem: Skin Integrity: Goal: Risk for impaired skin integrity will decrease 07/09/2023 1738 by Oneita Kras, RN Outcome: Adequate for Discharge 07/09/2023 1738 by Oneita Kras, RN Outcome: Progressing

## 2023-07-09 NOTE — TOC Transition Note (Signed)
 Transition of Care [facility]) - Discharge Note   Patient Details  Name: Lisa Williams MRN: [id number] Date of Birth: 02/04/1961  [facility] CM/SW Contact:  Chapman Fitch, RN Phone Number: 07/09/2023, 3:58 PM   Clinical Narrative:        Patient to discharge today Safe transport called, patient has private paid [employer] waiver signed and placed in chart     Patient Goals and CMS Choice            Discharge Placement                       Discharge Plan and Services Additional resources added to the After Visit Summary for                                       Social Drivers of Health (SDOH) Interventions SDOH Screenings   Food Insecurity: No Food Insecurity (07/08/2023)  Housing: Low Risk  (07/09/2023)  Transportation Needs: No Transportation Needs (07/09/2023)  Utilities: Not At Risk (07/09/2023)  Financial Resource Strain: Low Risk  (01/18/2023)   Received from [facility]  Tobacco Use: Medium Risk (07/07/2023)     Readmission Risk Interventions     No data to display

## 2023-07-09 NOTE — TOC Initial Note (Signed)
 Transition of Care [facility]) - Initial/Assessment Note    Patient Details  Name: Lisa Williams MRN: [id number] Date of Birth: June 26, 1960  Transition of Care [facility]) CM/SW Contact:    Chapman Fitch, RN Phone Number: 07/09/2023, 12:04 PM  Clinical Narrative:                  Notified that patient will require EMS transport home at discharge.  VM left for HTA to confirm is patient will require auth       Patient Goals and CMS Choice            Expected Discharge Plan and Services                                              Prior Living Arrangements/Services                       Activities of Daily Living   ADL Screening (condition at time of admission) Independently performs ADLs?: No Does the patient have a NEW difficulty with bathing/dressing/toileting/self-feeding that is expected to last >3 days?: Yes (Initiates electronic notice to provider for possible OT consult) Does the patient have a NEW difficulty with getting in/out of bed, walking, or climbing stairs that is expected to last >3 days?: Yes (Initiates electronic notice to provider for possible PT consult) Does the patient have a NEW difficulty with communication that is expected to last >3 days?: No Is the patient deaf or have difficulty hearing?: No Does the patient have difficulty seeing, even when wearing glasses/contacts?: No Does the patient have difficulty concentrating, remembering, or making decisions?: No  Permission Sought/Granted                  Emotional Assessment              Admission diagnosis:  Sinus tachycardia [R00.0] Atypical chest pain [R07.89] Patient Active Problem List   Diagnosis Date Noted   Sinus tachycardia 07/07/2023   Hypokalemia 07/29/2022   Weakness of both lower extremities 07/29/2022   Abscess of left groin 11/15/2021   Abnormal LFTs 11/15/2021   Acute respiratory disease due to COVID-19 virus 11/21/2020   Weakness     Hypoalbuminemia due to protein-calorie malnutrition (HCC)    Neurogenic bowel    Neurogenic bladder    Labile blood pressure    Neuropathic pain    Abscess of female pelvis    SVT (supraventricular tachycardia) (HCC)    Radial styloid tenosynovitis 03/12/2018   Wheelchair dependence 02/27/2018   Localized osteoporosis with current pathological fracture with routine healing 01/19/2017   Wrist fracture 01/16/2017   Sprain of ankle 03/23/2016   Closed fracture of lateral malleolus 03/16/2016   Health care maintenance 01/24/2016   Blood pressure elevated without history of HTN 10/25/2015   Essential hypertension 10/25/2015   Multiple sclerosis (HCC) 10/02/2015   Chronic left shoulder pain 07/19/2015   Multiple sclerosis exacerbation (HCC) 07/14/2015   MS (multiple sclerosis) (HCC) 11/26/2014   Increased body mass index 11/26/2014   HPV test positive 11/26/2014   Status post laparoscopic supracervical hysterectomy 11/26/2014   Galactorrhea 11/26/2014   Back ache 05/21/2014   Adiposity 05/21/2014   Disordered sleep 05/21/2014   Muscle spasticity 05/21/2014   Spasticity 05/21/2014   Calculus of kidney 12/09/2013   Renal colic 12/09/2013   Hypercholesteremia 08/19/2013  Hereditary and idiopathic neuropathy 08/19/2013   Hypercholesterolemia without hypertriglyceridemia 08/19/2013   Bladder infection, chronic 07/25/2012   Disorder of bladder function 07/25/2012   Incomplete bladder emptying 07/25/2012   Microscopic hematuria 07/25/2012   Right upper quadrant pain 07/25/2012   PCP:  Lauro Regulus, MD Pharmacy:   [facility] [place], [place] - [address] [place] [address] Phone: [phone] Fax: [phone]  [facility] [place], [place] - [address] [place] [address] Phone: [phone] Fax: [phone]  [facility] [address] [facility] [place], [place] - [address] [facility] [address] [place] [address] Phone: [phone] Fax: [phone]  [facility] [place], [place] - [address] [place] [address] Phone: [phone] Fax: [phone]     Social Drivers of Health (SDOH) Social History: SDOH Screenings   Food Insecurity: No Food Insecurity (07/08/2023)  Housing: Low Risk  (07/09/2023)  Transportation Needs: No Transportation Needs (07/09/2023)  Utilities: Not At Risk (07/09/2023)  Financial Resource Strain: Low Risk  (01/18/2023)   Received from [facility]  Tobacco Use: Medium Risk (07/07/2023)   SDOH Interventions:     Readmission Risk Interventions     No data to display

## 2023-07-10 ENCOUNTER — Ambulatory Visit: Payer: PPO

## 2023-07-11 NOTE — Therapy (Signed)
 OUTPATIENT PHYSICAL THERAPY NEURO EVALUATION   Patient Name: Lisa Williams MRN: [id number] DOB:1960/06/01, 63 y.o., female Today's Date: 07/12/2023   PCP: Einar Crow  REFERRING PROVIDER: Einar Crow  END OF SESSION:  PT End of Session - 07/12/23 1019     Visit Number 1    Number of Visits 24    Date for PT Re-Evaluation 10/04/23    PT Start Time 1015    PT Stop Time 1059    PT Time Calculation (min) 44 min    Equipment Utilized During Treatment Gait belt    Activity Tolerance Patient tolerated treatment well;No increased pain    Behavior During Therapy [facility] for tasks assessed/performed             Past Medical History:  Diagnosis Date   Abdominal pain, right upper quadrant    Back pain    Calculus of kidney 12/09/2013   Chronic back pain    unspecified   Chronic left shoulder pain 07/19/2015   Complication of anesthesia    Functional disorder of bladder    other   Galactorrhea 11/26/2014   Chronic    Hereditary and idiopathic neuropathy 08/19/2013   History of kidney stones    HPV test positive    Hypercholesteremia 08/19/2013   Hypertension    Incomplete bladder emptying    Microscopic hematuria    MS (multiple sclerosis) (HCC)    Muscle spasticity 05/21/2014   Nonspecific findings on examination of urine    other   Osteopenia    PONV (postoperative nausea and vomiting)    Status post laparoscopic supracervical hysterectomy 11/26/2014   Tobacco user 11/26/2014   Wrist fracture    Past Surgical History:  Procedure Laterality Date   bilateral tubal ligation  1996   BREAST CYST EXCISION Left 2002   CYST EXCISION Left 05/10/2022   Procedure: CYST REMOVAL;  Surgeon: Carolan Shiver, MD;  Location: [facility] ORS;  Service: General;  Laterality: Left;   FRACTURE SURGERY     KNEE SURGERY     right   LAPAROSCOPIC SUPRACERVICAL HYSTERECTOMY  08/05/2013   ORIF WRIST FRACTURE Left 01/17/2017   Procedure: OPEN REDUCTION INTERNAL FIXATION  (ORIF) WRIST FRACTURE;  Surgeon: Lyndle Herrlich, MD;  Location: [facility] ORS;  Service: Orthopedics;  Laterality: Left;   RADIOLOGY WITH ANESTHESIA N/A 03/18/2020   Procedure: MRI WITH ANESTHESIA CERVICAL SPINE AND BRAIN  WITH AND WITHOUT CONTRAST;  Surgeon: Radiologist, Medication, MD;  Location: [facility] OR;  Service: Radiology;  Laterality: N/A;   TUBAL LIGATION Bilateral    VAGINAL HYSTERECTOMY  03/2006   Patient Active Problem List   Diagnosis Date Noted   Sinus tachycardia 07/07/2023   Hypokalemia 07/29/2022   Weakness of both lower extremities 07/29/2022   Abscess of left groin 11/15/2021   Abnormal LFTs 11/15/2021   Acute respiratory disease due to COVID-19 virus 11/21/2020   Weakness    Hypoalbuminemia due to protein-calorie malnutrition [facility])    Neurogenic bowel    Neurogenic bladder    Labile blood pressure    Neuropathic pain    Abscess of female pelvis    SVT (supraventricular tachycardia) (HCC)    Radial styloid tenosynovitis 03/12/2018   Wheelchair dependence 02/27/2018   Localized osteoporosis with current pathological fracture with routine healing 01/19/2017   Wrist fracture 01/16/2017   Sprain of ankle 03/23/2016   Closed fracture of lateral malleolus 03/16/2016   Health care maintenance 01/24/2016   Blood pressure elevated without history of HTN 10/25/2015  Essential hypertension 10/25/2015   Multiple sclerosis (HCC) 10/02/2015   Chronic left shoulder pain 07/19/2015   Multiple sclerosis exacerbation (HCC) 07/14/2015   MS (multiple sclerosis) (HCC) 11/26/2014   Increased body mass index 11/26/2014   HPV test positive 11/26/2014   Status post laparoscopic supracervical hysterectomy 11/26/2014   Galactorrhea 11/26/2014   Back ache 05/21/2014   Adiposity 05/21/2014   Disordered sleep 05/21/2014   Muscle spasticity 05/21/2014   Spasticity 05/21/2014   Calculus of kidney 12/09/2013   Renal colic 12/09/2013   Hypercholesteremia 08/19/2013   Hereditary and  idiopathic neuropathy 08/19/2013   Hypercholesterolemia without hypertriglyceridemia 08/19/2013   Bladder infection, chronic 07/25/2012   Disorder of bladder function 07/25/2012   Incomplete bladder emptying 07/25/2012   Microscopic hematuria 07/25/2012   Right upper quadrant pain 07/25/2012    ONSET DATE: 1995  REFERRING DIAG: MS  THERAPY DIAG:  Muscle weakness (generalized)  Difficulty in walking, not elsewhere classified  Unsteadiness on feet  Rationale for Evaluation and Treatment: Rehabilitation  SUBJECTIVE:                                                                                                                                                                                             SUBJECTIVE STATEMENT: Patietn presents to evaluation for MS. Pt accompanied by: self  PERTINENT HISTORY:  Patient is returning to PT s/p hospitalization.  s/p ORIF of R tibia shaft fracture 09/23/2022. Patient has weakness in BLE with RLE>LLE. She drives with hand controls. Patient has been diagnosed with MS in 1995. PMH includes: back pain, CBP, chronic L shoulder pain, galactorrhea, neuropathy, HPV, hypercholestermeia, HTN, MS, osteopenia, PONV, wrist fracture. Additional order for other closed fracture of proximal end of R tibia with routine healing. Still has to use [employer].   PAIN:  Are you having pain?  Occasional pain in RLE; primarily in knee  PRECAUTIONS: Fall  RED FLAGS: None   WEIGHT BEARING RESTRICTIONS: No  FALLS: Has patient fallen in last 6 months? No  LIVING ENVIRONMENT: Lives with: lives with their family Lives in: House/apartment Stairs:  ramp Has following equipment at home: Dan Humphreys - 2 wheeled, Wheelchair (manual), shower chair, and Grab bars  PLOF: Independent with household mobility with device  PATIENT GOALS: to get her independence back. To be able to get into car, toilet, and get dressed independently  OBJECTIVE:  Note: Objective measures were  completed at Evaluation unless otherwise noted.  DIAGNOSTIC FINDINGS: MRI of the brain 03/18/2020 showed multiple T2/FLAIR hyperintense foci in the periventricular, juxtacortical and deep white matter.  There were no infratentorial lesions noted.  None of the foci  enhanced.   Due to severe claustrophobia, the study was done with conscious sedation in the hospital   COGNITION: Overall cognitive status: Within functional limits for tasks assessed   SENSATION: Lack of sensation in feet, loss in bilateral lateral aspect of knee  COORDINATION: Does not have the strength for functional LE heel slide test     MUSCLE TONE: BLE mild tone    POSTURE: rounded shoulders, forward head, anterior pelvic tilt, and weight shift left    LOWER EXTREMITY MMT:    MMT Right Eval Left Eval  Hip flexion 0.9 1.5  Hip extension    Hip abduction 1.8 2.6  Hip adduction 3.1 1.9  Hip internal rotation    Hip external rotation    Knee flexion 0.9 1.5  Knee extension 0.8 1.2  Ankle dorsiflexion    Ankle plantarflexion    Ankle inversion    Ankle eversion    (Blank rows = not tested)  BED MOBILITY:  Assess in future session due to limited time  TRANSFERS: Assistive device utilized:  Bariatric RW with sit to stands, slide board to table   Sit to stand:  unable to reach full stand Stand to sit:  unable to reach full stand  Chair to chair:  slide board with CGA     GAIT: Unable to ambulate at this time.   FUNCTIONAL TESTS:  Sit to stand: tricep press with BUE; unable to bring arm to walker.    Function In Sitting Test (FIST)  (1/2 femur on surface; hips/knees flexed to 90deg)   - indicate bed or mat table / step stool if used  SCORING KEY: 4 = Independent (completes task independently & successfully) 3 = Verbal Cues/Increased Time (completes task independently & successfully and only needs more time/cues) 2 = Upper Extremity Support (must use UE for support or assistance to complete  successfully) 1 = Needs Assistance (unable to complete w/o physical assist; DOCUMENT LEVEL: min, mod, max) 0 = Dependent (requires complete physical assist; unable to complete successfully even w/ physical assist)  Randomly Administer Once Throughout Exam  4 - Anterior Nudge (superior sternum)  4 - Posterior Nudge (between scapular spines)  4 - Lateral Nudge (to dominant side at acromion)     4 - Static sitting (30 seconds)  4 - Sitting, shake 'no' (left and right)  4 - Sitting, eyes closed (30 seconds)   0 - Sitting, lift foot (dominant side, lift foot 1 inch twice)    2 - Pick up object from behind (object at midline, hands breadth posterior)  3 - Forward reach (use dominant arm, must complete full motion) 2 - Lateral reach (use dominant arm, clear opposite ischial tuberosity) 2 - Pick up object from floor (from between feet)   2 - Posterior scooting (move backwards 2 inches)  2 - Anterior scooting (move forward 2 inches)  2 - Lateral scooting (move to dominant side 2 inches)    TOTAL = 39/56  Notes/comments: slide board tranfer to/from table   MCD > 5 points MCID for IP REHAB > 6 points  TREATMENT DATE: 07/12/23   Eval only  PATIENT EDUCATION: Education details: goals, POC, HEP  Person educated: Patient Education method: Explanation, Demonstration, Tactile cues, Verbal cues, and Handouts Education comprehension: verbalized understanding, returned demonstration, verbal cues required, tactile cues required, and needs further education  HOME EXERCISE PROGRAM: Stand 3x/day in stander   GOALS: Goals reviewed with patient? Yes  SHORT TERM GOALS: Target date: 08/08/2023    Patient will be independent in home exercise program to improve strength/mobility for better functional independence with ADLs.  Baseline: Goal status: INITIAL   Goal  status: INITIAL  LONG TERM GOALS: Target date: 10/04/2023    Patient will tolerate five consecutive stands with UE support from wheelchair to standing to improve functional mobility.  Baseline: unable to perform  Goal status: INITIAL  2.  Patient will ambulate 10 ft with bRW with wheelchair follow.  Baseline:  unable to ambulate Goal status: INITIAL  3.  Patient will improve FIST score >6 points to demonstrate improved stability and ability to perform ADLs.  Baseline:  3/6: 39/56 Goal status: INITIAL  4.  Patient will perform toileting with mod I at home for improved independence.  Baseline: 3/6: requires assistance Goal status: INITIAL    ASSESSMENT:  CLINICAL IMPRESSION: Patient is a 63 y.o. female who was seen today for physical therapy evaluation and treatment for MS.  Patient was recently hospitalized and demonstrates increased weakness compared to previous attendance of physical therapy. She is unable to demonstrate standing this session indicating first level of mobility to focus on at this time.  Patient will need to stand to perform ADLs such as toileting and dressing. FIST tolerated well. Patient will benefit from skilled physical therapy to improve mobility, stability, and independence.   OBJECTIVE IMPAIRMENTS: Abnormal gait, cardiopulmonary status limiting activity, decreased activity tolerance, decreased balance, decreased coordination, decreased endurance, decreased mobility, difficulty walking, decreased ROM, decreased strength, hypomobility, increased fascial restrictions, impaired perceived functional ability, impaired flexibility, impaired sensation, improper body mechanics, and postural dysfunction.   ACTIVITY LIMITATIONS: carrying, lifting, bending, sitting, standing, squatting, sleeping, stairs, transfers, bed mobility, continence, bathing, toileting, dressing, self feeding, reach over head, hygiene/grooming, locomotion level, and caring for others  PARTICIPATION  LIMITATIONS: meal prep, cleaning, laundry, interpersonal relationship, driving, shopping, community activity, and church  PERSONAL FACTORS: Age, Past/current experiences, Time since onset of injury/illness/exacerbation, Transportation, and 3+ comorbidities: ack pain, CBP, chronic L shoulder pain, galactorrhea, neuropathy, HPV, hypercholestermeia, HTN, MS, osteopenia, PONV, wrist fracture  are also affecting patient's functional outcome.   REHAB POTENTIAL: Good  CLINICAL DECISION MAKING: Evolving/moderate complexity  EVALUATION COMPLEXITY: Moderate  PLAN:  PT FREQUENCY: 2x/week  PT DURATION: 12 weeks  PLANNED INTERVENTIONS: 97164- PT Re-evaluation, 97110-Therapeutic exercises, 97530- Therapeutic activity, 97112- Neuromuscular re-education, 97535- Self Care, 64403- Manual therapy, [phone]- Gait training, [phone]- Orthotic Fit/training, [phone]- Canalith repositioning, [id number]- Aquatic Therapy, [phone]- Electrical stimulation (unattended), [phone]- Electrical stimulation (manual), [id number]- Ultrasound, 60630- Traction (mechanical), Patient/Family education, Balance training, Stair training, Taping, Dry Needling, Joint mobilization, Joint manipulation, Spinal manipulation, Spinal mobilization, Scar mobilization, Compression bandaging, Vestibular training, Visual/preceptual remediation/compensation, Cognitive remediation, DME instructions, Cryotherapy, Moist heat, and Biofeedback  PLAN FOR NEXT SESSION: sit to stands    [employer], PT 07/12/2023, 1:02 PM

## 2023-07-12 ENCOUNTER — Ambulatory Visit: Payer: PPO | Attending: Neurology

## 2023-07-12 DIAGNOSIS — R2689 Other abnormalities of gait and mobility: Secondary | ICD-10-CM | POA: Insufficient documentation

## 2023-07-12 DIAGNOSIS — R2681 Unsteadiness on feet: Secondary | ICD-10-CM

## 2023-07-12 DIAGNOSIS — M6281 Muscle weakness (generalized): Secondary | ICD-10-CM | POA: Diagnosis not present

## 2023-07-12 DIAGNOSIS — R262 Difficulty in walking, not elsewhere classified: Secondary | ICD-10-CM | POA: Diagnosis not present

## 2023-07-16 NOTE — Therapy (Signed)
 OUTPATIENT PHYSICAL THERAPY NEURO TREATMENT   Patient Name: Lisa Williams MRN: [id number] DOB:09-Aug-1960, 63 y.o., female Today's Date: 07/17/2023   PCP: Einar Crow  REFERRING PROVIDER: Einar Crow  END OF SESSION:  PT End of Session - 07/17/23 1012     Visit Number 2    Number of Visits 24    Date for PT Re-Evaluation 10/04/23    PT Start Time 1015    PT Stop Time 1059    PT Time Calculation (min) 44 min    Equipment Utilized During Treatment Gait belt    Activity Tolerance Patient tolerated treatment well;No increased pain    Behavior During Therapy [facility] for tasks assessed/performed              Past Medical History:  Diagnosis Date   Abdominal pain, right upper quadrant    Back pain    Calculus of kidney 12/09/2013   Chronic back pain    unspecified   Chronic left shoulder pain 07/19/2015   Complication of anesthesia    Functional disorder of bladder    other   Galactorrhea 11/26/2014   Chronic    Hereditary and idiopathic neuropathy 08/19/2013   History of kidney stones    HPV test positive    Hypercholesteremia 08/19/2013   Hypertension    Incomplete bladder emptying    Microscopic hematuria    MS (multiple sclerosis) (HCC)    Muscle spasticity 05/21/2014   Nonspecific findings on examination of urine    other   Osteopenia    PONV (postoperative nausea and vomiting)    Status post laparoscopic supracervical hysterectomy 11/26/2014   Tobacco user 11/26/2014   Wrist fracture    Past Surgical History:  Procedure Laterality Date   bilateral tubal ligation  1996   BREAST CYST EXCISION Left 2002   CYST EXCISION Left 05/10/2022   Procedure: CYST REMOVAL;  Surgeon: Carolan Shiver, MD;  Location: [facility] ORS;  Service: General;  Laterality: Left;   FRACTURE SURGERY     KNEE SURGERY     right   LAPAROSCOPIC SUPRACERVICAL HYSTERECTOMY  08/05/2013   ORIF WRIST FRACTURE Left 01/17/2017   Procedure: OPEN REDUCTION INTERNAL  FIXATION (ORIF) WRIST FRACTURE;  Surgeon: Lyndle Herrlich, MD;  Location: [facility] ORS;  Service: Orthopedics;  Laterality: Left;   RADIOLOGY WITH ANESTHESIA N/A 03/18/2020   Procedure: MRI WITH ANESTHESIA CERVICAL SPINE AND BRAIN  WITH AND WITHOUT CONTRAST;  Surgeon: Radiologist, Medication, MD;  Location: [facility] OR;  Service: Radiology;  Laterality: N/A;   TUBAL LIGATION Bilateral    VAGINAL HYSTERECTOMY  03/2006   Patient Active Problem List   Diagnosis Date Noted   Sinus tachycardia 07/07/2023   Hypokalemia 07/29/2022   Weakness of both lower extremities 07/29/2022   Abscess of left groin 11/15/2021   Abnormal LFTs 11/15/2021   Acute respiratory disease due to COVID-19 virus 11/21/2020   Weakness    Hypoalbuminemia due to protein-calorie malnutrition [facility])    Neurogenic bowel    Neurogenic bladder    Labile blood pressure    Neuropathic pain    Abscess of female pelvis    SVT (supraventricular tachycardia) (HCC)    Radial styloid tenosynovitis 03/12/2018   Wheelchair dependence 02/27/2018   Localized osteoporosis with current pathological fracture with routine healing 01/19/2017   Wrist fracture 01/16/2017   Sprain of ankle 03/23/2016   Closed fracture of lateral malleolus 03/16/2016   Health care maintenance 01/24/2016   Blood pressure elevated without history of HTN 10/25/2015  Essential hypertension 10/25/2015   Multiple sclerosis (HCC) 10/02/2015   Chronic left shoulder pain 07/19/2015   Multiple sclerosis exacerbation (HCC) 07/14/2015   MS (multiple sclerosis) (HCC) 11/26/2014   Increased body mass index 11/26/2014   HPV test positive 11/26/2014   Status post laparoscopic supracervical hysterectomy 11/26/2014   Galactorrhea 11/26/2014   Back ache 05/21/2014   Adiposity 05/21/2014   Disordered sleep 05/21/2014   Muscle spasticity 05/21/2014   Spasticity 05/21/2014   Calculus of kidney 12/09/2013   Renal colic 12/09/2013   Hypercholesteremia 08/19/2013   Hereditary and  idiopathic neuropathy 08/19/2013   Hypercholesterolemia without hypertriglyceridemia 08/19/2013   Bladder infection, chronic 07/25/2012   Disorder of bladder function 07/25/2012   Incomplete bladder emptying 07/25/2012   Microscopic hematuria 07/25/2012   Right upper quadrant pain 07/25/2012    ONSET DATE: 1995  REFERRING DIAG: MS  THERAPY DIAG:  Muscle weakness (generalized)  Difficulty in walking, not elsewhere classified  Unsteadiness on feet  Other abnormalities of gait and mobility  Rationale for Evaluation and Treatment: Rehabilitation  SUBJECTIVE:                                                                                                                                                                                             SUBJECTIVE STATEMENT: Patient had bloodwork done before PT.  Pt accompanied by: self  PERTINENT HISTORY:  Patient is returning to PT s/p hospitalization.  s/p ORIF of R tibia shaft fracture 09/23/2022. Patient has weakness in BLE with RLE>LLE. She drives with hand controls. Patient has been diagnosed with MS in 1995. PMH includes: back pain, CBP, chronic L shoulder pain, galactorrhea, neuropathy, HPV, hypercholestermeia, HTN, MS, osteopenia, PONV, wrist fracture. Additional order for other closed fracture of proximal end of R tibia with routine healing. Still has to use [employer].   PAIN:  Are you having pain?  Occasional pain in RLE; primarily in knee  PRECAUTIONS: Fall  RED FLAGS: None   WEIGHT BEARING RESTRICTIONS: No  FALLS: Has patient fallen in last 6 months? No  LIVING ENVIRONMENT: Lives with: lives with their family Lives in: House/apartment Stairs:  ramp Has following equipment at home: Dan Humphreys - 2 wheeled, Wheelchair (manual), shower chair, and Grab bars  PLOF: Independent with household mobility with device  PATIENT GOALS: to get her independence back. To be able to get into car, toilet, and get dressed  independently  OBJECTIVE:  Note: Objective measures were completed at Evaluation unless otherwise noted.  DIAGNOSTIC FINDINGS: MRI of the brain 03/18/2020 showed multiple T2/FLAIR hyperintense foci in the periventricular, juxtacortical and deep white matter.  There were no  infratentorial lesions noted.  None of the foci enhanced.   Due to severe claustrophobia, the study was done with conscious sedation in the hospital   COGNITION: Overall cognitive status: Within functional limits for tasks assessed   SENSATION: Lack of sensation in feet, loss in bilateral lateral aspect of knee  COORDINATION: Does not have the strength for functional LE heel slide test     MUSCLE TONE: BLE mild tone    POSTURE: rounded shoulders, forward head, anterior pelvic tilt, and weight shift left    LOWER EXTREMITY MMT:    MMT Right Eval Left Eval  Hip flexion 0.9 1.5  Hip extension    Hip abduction 1.8 2.6  Hip adduction 3.1 1.9  Hip internal rotation    Hip external rotation    Knee flexion 0.9 1.5  Knee extension 0.8 1.2  Ankle dorsiflexion    Ankle plantarflexion    Ankle inversion    Ankle eversion    (Blank rows = not tested)  BED MOBILITY:  Assess in future session due to limited time  TRANSFERS: Assistive device utilized:  Bariatric RW with sit to stands, slide board to table   Sit to stand:  unable to reach full stand Stand to sit:  unable to reach full stand  Chair to chair:  slide board with CGA     GAIT: Unable to ambulate at this time.   FUNCTIONAL TESTS:  Sit to stand: tricep press with BUE; unable to bring arm to walker.    Function In Sitting Test (FIST)  (1/2 femur on surface; hips/knees flexed to 90deg)   - indicate bed or mat table / step stool if used  SCORING KEY: 4 = Independent (completes task independently & successfully) 3 = Verbal Cues/Increased Time (completes task independently & successfully and only needs more time/cues) 2 = Upper Extremity  Support (must use UE for support or assistance to complete successfully) 1 = Needs Assistance (unable to complete w/o physical assist; DOCUMENT LEVEL: min, mod, max) 0 = Dependent (requires complete physical assist; unable to complete successfully even w/ physical assist)  Randomly Administer Once Throughout Exam  4 - Anterior Nudge (superior sternum)  4 - Posterior Nudge (between scapular spines)  4 - Lateral Nudge (to dominant side at acromion)     4 - Static sitting (30 seconds)  4 - Sitting, shake 'no' (left and right)  4 - Sitting, eyes closed (30 seconds)   0 - Sitting, lift foot (dominant side, lift foot 1 inch twice)    2 - Pick up object from behind (object at midline, hands breadth posterior)  3 - Forward reach (use dominant arm, must complete full motion) 2 - Lateral reach (use dominant arm, clear opposite ischial tuberosity) 2 - Pick up object from floor (from between feet)   2 - Posterior scooting (move backwards 2 inches)  2 - Anterior scooting (move forward 2 inches)  2 - Lateral scooting (move to dominant side 2 inches)    TOTAL = 39/56  Notes/comments: slide board tranfer to/from table   MCD > 5 points MCID for IP REHAB > 6 points  TREATMENT DATE: 07/17/23 TherAct:  All activities performed with gait belt and CGA unless otherwise noted.    In // bars: Sit to stand first stand: stand 30 seconds Sit to stand 2nd stand: 5 x forward/backward step RLE; unable to perform LLE Third attempt stand unsuccessful Partial sit to stand: 5x; press through LE's    Seated: RTB abduction 15x RTB adduction 15x Isometric LE press 15x each LE Hip flexion isometric 10x each LE    Education on energy conservation techniques    Pt requires significant therapeutic rest breaks throughout the session due to fatigue.    PATIENT  EDUCATION: Education details: goals, POC, HEP  Person educated: Patient Education method: Explanation, Demonstration, Tactile cues, Verbal cues, and Handouts Education comprehension: verbalized understanding, returned demonstration, verbal cues required, tactile cues required, and needs further education  HOME EXERCISE PROGRAM: Stand 3x/day in stander   GOALS: Goals reviewed with patient? Yes  SHORT TERM GOALS: Target date: 08/08/2023    Patient will be independent in home exercise program to improve strength/mobility for better functional independence with ADLs.  Baseline: Goal status: INITIAL   Goal status: INITIAL  LONG TERM GOALS: Target date: 10/04/2023    Patient will tolerate five consecutive stands with UE support from wheelchair to standing to improve functional mobility.  Baseline: unable to perform  Goal status: INITIAL  2.  Patient will ambulate 10 ft with bRW with wheelchair follow.  Baseline:  unable to ambulate Goal status: INITIAL  3.  Patient will improve FIST score >6 points to demonstrate improved stability and ability to perform ADLs.  Baseline:  3/6: 39/56 Goal status: INITIAL  4.  Patient will perform toileting with mod I at home for improved independence.  Baseline: 3/6: requires assistance Goal status: INITIAL    ASSESSMENT:  CLINICAL IMPRESSION: Patient tolerates standing in // bars for two stands. She is fatigued with standing and requires heavy UE support. Patient is highly motivated throughout session despite fatigue. Education on rest and need for breathing control performed.  Patient will benefit from skilled physical therapy to improve mobility, stability, and independence.   OBJECTIVE IMPAIRMENTS: Abnormal gait, cardiopulmonary status limiting activity, decreased activity tolerance, decreased balance, decreased coordination, decreased endurance, decreased mobility, difficulty walking, decreased ROM, decreased strength, hypomobility,  increased fascial restrictions, impaired perceived functional ability, impaired flexibility, impaired sensation, improper body mechanics, and postural dysfunction.   ACTIVITY LIMITATIONS: carrying, lifting, bending, sitting, standing, squatting, sleeping, stairs, transfers, bed mobility, continence, bathing, toileting, dressing, self feeding, reach over head, hygiene/grooming, locomotion level, and caring for others  PARTICIPATION LIMITATIONS: meal prep, cleaning, laundry, interpersonal relationship, driving, shopping, community activity, and church  PERSONAL FACTORS: Age, Past/current experiences, Time since onset of injury/illness/exacerbation, Transportation, and 3+ comorbidities: ack pain, CBP, chronic L shoulder pain, galactorrhea, neuropathy, HPV, hypercholestermeia, HTN, MS, osteopenia, PONV, wrist fracture  are also affecting patient's functional outcome.   REHAB POTENTIAL: Good  CLINICAL DECISION MAKING: Evolving/moderate complexity  EVALUATION COMPLEXITY: Moderate  PLAN:  PT FREQUENCY: 2x/week  PT DURATION: 12 weeks  PLANNED INTERVENTIONS: 97164- PT Re-evaluation, 97110-Therapeutic exercises, 97530- Therapeutic activity, 97112- Neuromuscular re-education, 97535- Self Care, 19147- Manual therapy, [phone]- Gait training, [phone]- Orthotic Fit/training, [phone]- Canalith repositioning, [id number]- Aquatic Therapy, [phone]- Electrical stimulation (unattended), [phone]- Electrical stimulation (manual), [id number]- Ultrasound, 32440- Traction (mechanical), Patient/Family education, Balance training, Stair training, Taping, Dry Needling, Joint mobilization, Joint manipulation, Spinal manipulation, Spinal mobilization, Scar mobilization, Compression bandaging, Vestibular training, Visual/preceptual remediation/compensation, Cognitive remediation, DME instructions, Cryotherapy, Moist heat, and Biofeedback  PLAN FOR NEXT  SESSION: sit to stands    Precious Bard, PT 07/17/2023, 1:35 PM

## 2023-07-17 ENCOUNTER — Ambulatory Visit: Payer: PPO

## 2023-07-17 DIAGNOSIS — R2689 Other abnormalities of gait and mobility: Secondary | ICD-10-CM

## 2023-07-17 DIAGNOSIS — M6281 Muscle weakness (generalized): Secondary | ICD-10-CM

## 2023-07-17 DIAGNOSIS — R2681 Unsteadiness on feet: Secondary | ICD-10-CM

## 2023-07-17 DIAGNOSIS — I700 Atherosclerosis of aorta: Secondary | ICD-10-CM | POA: Diagnosis not present

## 2023-07-17 DIAGNOSIS — R7303 Prediabetes: Secondary | ICD-10-CM | POA: Diagnosis not present

## 2023-07-17 DIAGNOSIS — R262 Difficulty in walking, not elsewhere classified: Secondary | ICD-10-CM

## 2023-07-17 DIAGNOSIS — I10 Essential (primary) hypertension: Secondary | ICD-10-CM | POA: Diagnosis not present

## 2023-07-18 NOTE — Therapy (Signed)
 OUTPATIENT PHYSICAL THERAPY NEURO TREATMENT   Patient Name: Lisa Williams MRN: [id number] DOB:May 06, 1961, 63 y.o., female Today's Date: 07/19/2023   PCP: Einar Crow  REFERRING PROVIDER: Einar Crow  END OF SESSION:  PT End of Session - 07/19/23 1022     Visit Number 3    Number of Visits 24    Date for PT Re-Evaluation 10/04/23    PT Start Time 1016    PT Stop Time 1059    PT Time Calculation (min) 43 min    Equipment Utilized During Treatment Gait belt    Activity Tolerance Patient tolerated treatment well;No increased pain    Behavior During Therapy [facility] for tasks assessed/performed               Past Medical History:  Diagnosis Date   Abdominal pain, right upper quadrant    Back pain    Calculus of kidney 12/09/2013   Chronic back pain    unspecified   Chronic left shoulder pain 07/19/2015   Complication of anesthesia    Functional disorder of bladder    other   Galactorrhea 11/26/2014   Chronic    Hereditary and idiopathic neuropathy 08/19/2013   History of kidney stones    HPV test positive    Hypercholesteremia 08/19/2013   Hypertension    Incomplete bladder emptying    Microscopic hematuria    MS (multiple sclerosis) (HCC)    Muscle spasticity 05/21/2014   Nonspecific findings on examination of urine    other   Osteopenia    PONV (postoperative nausea and vomiting)    Status post laparoscopic supracervical hysterectomy 11/26/2014   Tobacco user 11/26/2014   Wrist fracture    Past Surgical History:  Procedure Laterality Date   bilateral tubal ligation  1996   BREAST CYST EXCISION Left 2002   CYST EXCISION Left 05/10/2022   Procedure: CYST REMOVAL;  Surgeon: Carolan Shiver, MD;  Location: [facility] ORS;  Service: General;  Laterality: Left;   FRACTURE SURGERY     KNEE SURGERY     right   LAPAROSCOPIC SUPRACERVICAL HYSTERECTOMY  08/05/2013   ORIF WRIST FRACTURE Left 01/17/2017   Procedure: OPEN REDUCTION INTERNAL  FIXATION (ORIF) WRIST FRACTURE;  Surgeon: Lyndle Herrlich, MD;  Location: [facility] ORS;  Service: Orthopedics;  Laterality: Left;   RADIOLOGY WITH ANESTHESIA N/A 03/18/2020   Procedure: MRI WITH ANESTHESIA CERVICAL SPINE AND BRAIN  WITH AND WITHOUT CONTRAST;  Surgeon: Radiologist, Medication, MD;  Location: [facility] OR;  Service: Radiology;  Laterality: N/A;   TUBAL LIGATION Bilateral    VAGINAL HYSTERECTOMY  03/2006   Patient Active Problem List   Diagnosis Date Noted   Sinus tachycardia 07/07/2023   Hypokalemia 07/29/2022   Weakness of both lower extremities 07/29/2022   Abscess of left groin 11/15/2021   Abnormal LFTs 11/15/2021   Acute respiratory disease due to COVID-19 virus 11/21/2020   Weakness    Hypoalbuminemia due to protein-calorie malnutrition [facility])    Neurogenic bowel    Neurogenic bladder    Labile blood pressure    Neuropathic pain    Abscess of female pelvis    SVT (supraventricular tachycardia) (HCC)    Radial styloid tenosynovitis 03/12/2018   Wheelchair dependence 02/27/2018   Localized osteoporosis with current pathological fracture with routine healing 01/19/2017   Wrist fracture 01/16/2017   Sprain of ankle 03/23/2016   Closed fracture of lateral malleolus 03/16/2016   Health care maintenance 01/24/2016   Blood pressure elevated without history of HTN 10/25/2015  Essential hypertension 10/25/2015   Multiple sclerosis (HCC) 10/02/2015   Chronic left shoulder pain 07/19/2015   Multiple sclerosis exacerbation (HCC) 07/14/2015   MS (multiple sclerosis) (HCC) 11/26/2014   Increased body mass index 11/26/2014   HPV test positive 11/26/2014   Status post laparoscopic supracervical hysterectomy 11/26/2014   Galactorrhea 11/26/2014   Back ache 05/21/2014   Adiposity 05/21/2014   Disordered sleep 05/21/2014   Muscle spasticity 05/21/2014   Spasticity 05/21/2014   Calculus of kidney 12/09/2013   Renal colic 12/09/2013   Hypercholesteremia 08/19/2013   Hereditary and  idiopathic neuropathy 08/19/2013   Hypercholesterolemia without hypertriglyceridemia 08/19/2013   Bladder infection, chronic 07/25/2012   Disorder of bladder function 07/25/2012   Incomplete bladder emptying 07/25/2012   Microscopic hematuria 07/25/2012   Right upper quadrant pain 07/25/2012    ONSET DATE: 1995  REFERRING DIAG: MS  THERAPY DIAG:  Muscle weakness (generalized)  Difficulty in walking, not elsewhere classified  Unsteadiness on feet  Other abnormalities of gait and mobility  Rationale for Evaluation and Treatment: Rehabilitation  SUBJECTIVE:                                                                                                                                                                                             SUBJECTIVE STATEMENT: Patient presents with good spirits.  Pt accompanied by: self  PERTINENT HISTORY:  Patient is returning to PT s/p hospitalization.  s/p ORIF of R tibia shaft fracture 09/23/2022. Patient has weakness in BLE with RLE>LLE. She drives with hand controls. Patient has been diagnosed with MS in 1995. PMH includes: back pain, CBP, chronic L shoulder pain, galactorrhea, neuropathy, HPV, hypercholestermeia, HTN, MS, osteopenia, PONV, wrist fracture. Additional order for other closed fracture of proximal end of R tibia with routine healing. Still has to use [employer].   PAIN:  Are you having pain?  Occasional pain in RLE; primarily in knee  PRECAUTIONS: Fall  RED FLAGS: None   WEIGHT BEARING RESTRICTIONS: No  FALLS: Has patient fallen in last 6 months? No  LIVING ENVIRONMENT: Lives with: lives with their family Lives in: House/apartment Stairs:  ramp Has following equipment at home: Dan Humphreys - 2 wheeled, Wheelchair (manual), shower chair, and Grab bars  PLOF: Independent with household mobility with device  PATIENT GOALS: to get her independence back. To be able to get into car, toilet, and get dressed  independently  OBJECTIVE:  Note: Objective measures were completed at Evaluation unless otherwise noted.  DIAGNOSTIC FINDINGS: MRI of the brain 03/18/2020 showed multiple T2/FLAIR hyperintense foci in the periventricular, juxtacortical and deep white matter.  There were no infratentorial  lesions noted.  None of the foci enhanced.   Due to severe claustrophobia, the study was done with conscious sedation in the hospital   COGNITION: Overall cognitive status: Within functional limits for tasks assessed   SENSATION: Lack of sensation in feet, loss in bilateral lateral aspect of knee  COORDINATION: Does not have the strength for functional LE heel slide test     MUSCLE TONE: BLE mild tone    POSTURE: rounded shoulders, forward head, anterior pelvic tilt, and weight shift left    LOWER EXTREMITY MMT:    MMT Right Eval Left Eval  Hip flexion 0.9 1.5  Hip extension    Hip abduction 1.8 2.6  Hip adduction 3.1 1.9  Hip internal rotation    Hip external rotation    Knee flexion 0.9 1.5  Knee extension 0.8 1.2  Ankle dorsiflexion    Ankle plantarflexion    Ankle inversion    Ankle eversion    (Blank rows = not tested)  BED MOBILITY:  Assess in future session due to limited time  TRANSFERS: Assistive device utilized:  Bariatric RW with sit to stands, slide board to table   Sit to stand:  unable to reach full stand Stand to sit:  unable to reach full stand  Chair to chair:  slide board with CGA     GAIT: Unable to ambulate at this time.   FUNCTIONAL TESTS:  Sit to stand: tricep press with BUE; unable to bring arm to walker.    Function In Sitting Test (FIST)  (1/2 femur on surface; hips/knees flexed to 90deg)   - indicate bed or mat table / step stool if used  SCORING KEY: 4 = Independent (completes task independently & successfully) 3 = Verbal Cues/Increased Time (completes task independently & successfully and only needs more time/cues) 2 = Upper Extremity  Support (must use UE for support or assistance to complete successfully) 1 = Needs Assistance (unable to complete w/o physical assist; DOCUMENT LEVEL: min, mod, max) 0 = Dependent (requires complete physical assist; unable to complete successfully even w/ physical assist)  Randomly Administer Once Throughout Exam  4 - Anterior Nudge (superior sternum)  4 - Posterior Nudge (between scapular spines)  4 - Lateral Nudge (to dominant side at acromion)     4 - Static sitting (30 seconds)  4 - Sitting, shake 'no' (left and right)  4 - Sitting, eyes closed (30 seconds)   0 - Sitting, lift foot (dominant side, lift foot 1 inch twice)    2 - Pick up object from behind (object at midline, hands breadth posterior)  3 - Forward reach (use dominant arm, must complete full motion) 2 - Lateral reach (use dominant arm, clear opposite ischial tuberosity) 2 - Pick up object from floor (from between feet)   2 - Posterior scooting (move backwards 2 inches)  2 - Anterior scooting (move forward 2 inches)  2 - Lateral scooting (move to dominant side 2 inches)    TOTAL = 39/56  Notes/comments: slide board tranfer to/from table   MCD > 5 points MCID for IP REHAB > 6 points  TREATMENT DATE: 07/19/23 TherAct:  All activities performed with gait belt and CGA unless otherwise noted.    Bariatric RW at position 4: seated in RW with pillow in chair -first attempt: sit to stand unable to reach full stand -second -8th attempt: unsuccessful full stands but able to reach partial stand -mini sit to stand pressing with LE's only 10x; very challenging for patient  TherEx  Seated: RTB abduction 15x RTB adduction 15x Isometric LE press 15x each LE Hip flexion isometric 10x each LE  Arm 90 and abduction raises 10x each direction Scapular retraction with pectoral stretch 10x     Education on energy conservation techniques    Pt requires significant therapeutic rest breaks throughout the session due to fatigue.    PATIENT EDUCATION: Education details: goals, POC, HEP  Person educated: Patient Education method: Explanation, Demonstration, Tactile cues, Verbal cues, and Handouts Education comprehension: verbalized understanding, returned demonstration, verbal cues required, tactile cues required, and needs further education  HOME EXERCISE PROGRAM: Stand 3x/day in stander   GOALS: Goals reviewed with patient? Yes  SHORT TERM GOALS: Target date: 08/08/2023    Patient will be independent in home exercise program to improve strength/mobility for better functional independence with ADLs.  Baseline: Goal status: INITIAL   Goal status: INITIAL  LONG TERM GOALS: Target date: 10/04/2023    Patient will tolerate five consecutive stands with UE support from wheelchair to standing to improve functional mobility.  Baseline: unable to perform  Goal status: INITIAL  2.  Patient will ambulate 10 ft with bRW with wheelchair follow.  Baseline:  unable to ambulate Goal status: INITIAL  3.  Patient will improve FIST score >6 points to demonstrate improved stability and ability to perform ADLs.  Baseline:  3/6: 39/56 Goal status: INITIAL  4.  Patient will perform toileting with mod I at home for improved independence.  Baseline: 3/6: requires assistance Goal status: INITIAL    ASSESSMENT:  CLINICAL IMPRESSION: Patient is unable to obtain a full stand from a seated position with bariatric RW and cushion in chair this session. She is able to obtain partial sit to stands but is limited still at this time. She remains highly motivated throughout session despite fatigue.  Education on rest and need for breathing control performed.  Patient will benefit from skilled physical therapy to improve mobility, stability, and independence.   OBJECTIVE IMPAIRMENTS: Abnormal  gait, cardiopulmonary status limiting activity, decreased activity tolerance, decreased balance, decreased coordination, decreased endurance, decreased mobility, difficulty walking, decreased ROM, decreased strength, hypomobility, increased fascial restrictions, impaired perceived functional ability, impaired flexibility, impaired sensation, improper body mechanics, and postural dysfunction.   ACTIVITY LIMITATIONS: carrying, lifting, bending, sitting, standing, squatting, sleeping, stairs, transfers, bed mobility, continence, bathing, toileting, dressing, self feeding, reach over head, hygiene/grooming, locomotion level, and caring for others  PARTICIPATION LIMITATIONS: meal prep, cleaning, laundry, interpersonal relationship, driving, shopping, community activity, and church  PERSONAL FACTORS: Age, Past/current experiences, Time since onset of injury/illness/exacerbation, Transportation, and 3+ comorbidities: ack pain, CBP, chronic L shoulder pain, galactorrhea, neuropathy, HPV, hypercholestermeia, HTN, MS, osteopenia, PONV, wrist fracture  are also affecting patient's functional outcome.   REHAB POTENTIAL: Good  CLINICAL DECISION MAKING: Evolving/moderate complexity  EVALUATION COMPLEXITY: Moderate  PLAN:  PT FREQUENCY: 2x/week  PT DURATION: 12 weeks  PLANNED INTERVENTIONS: 97164- PT Re-evaluation, 97110-Therapeutic exercises, 97530- Therapeutic activity, [id number]- Neuromuscular re-education, 97535- Self Care, 82956- Manual therapy, [id number]- Gait training, [phone]- Orthotic Fit/training, [phone]- Canalith repositioning, [id number]- Aquatic Therapy, [id number]- Electrical stimulation (unattended), [id number]- Electrical stimulation (  manual), [id number]- Ultrasound, [id number]- Traction (mechanical), Patient/Family education, Balance training, Stair training, Taping, Dry Needling, Joint mobilization, Joint manipulation, Spinal manipulation, Spinal mobilization, Scar mobilization, Compression bandaging, Vestibular training,  Visual/preceptual remediation/compensation, Cognitive remediation, DME instructions, Cryotherapy, Moist heat, and Biofeedback  PLAN FOR NEXT SESSION: sit to stands    Precious Bard, PT 07/19/2023, 12:33 PM

## 2023-07-19 ENCOUNTER — Ambulatory Visit: Payer: PPO

## 2023-07-19 DIAGNOSIS — R2689 Other abnormalities of gait and mobility: Secondary | ICD-10-CM

## 2023-07-19 DIAGNOSIS — M6281 Muscle weakness (generalized): Secondary | ICD-10-CM | POA: Diagnosis not present

## 2023-07-19 DIAGNOSIS — R2681 Unsteadiness on feet: Secondary | ICD-10-CM

## 2023-07-19 DIAGNOSIS — R262 Difficulty in walking, not elsewhere classified: Secondary | ICD-10-CM

## 2023-07-23 DIAGNOSIS — I10 Essential (primary) hypertension: Secondary | ICD-10-CM | POA: Diagnosis not present

## 2023-07-23 DIAGNOSIS — I700 Atherosclerosis of aorta: Secondary | ICD-10-CM | POA: Diagnosis not present

## 2023-07-23 DIAGNOSIS — R7303 Prediabetes: Secondary | ICD-10-CM | POA: Diagnosis not present

## 2023-07-23 NOTE — Therapy (Signed)
 OUTPATIENT PHYSICAL THERAPY NEURO TREATMENT   Patient Name: Lisa Williams MRN: [id number] DOB:October 21, 1960, 63 y.o., female Today's Date: 07/24/2023   PCP: Einar Crow  REFERRING PROVIDER: Einar Crow  END OF SESSION:  PT End of Session - 07/24/23 1014     Visit Number 4    Number of Visits 24    Date for PT Re-Evaluation 10/04/23    PT Start Time 1015    PT Stop Time 1059    PT Time Calculation (min) 44 min    Equipment Utilized During Treatment Gait belt    Activity Tolerance Patient tolerated treatment well;No increased pain    Behavior During Therapy [facility] for tasks assessed/performed               Past Medical History:  Diagnosis Date   Abdominal pain, right upper quadrant    Back pain    Calculus of kidney 12/09/2013   Chronic back pain    unspecified   Chronic left shoulder pain 07/19/2015   Complication of anesthesia    Functional disorder of bladder    other   Galactorrhea 11/26/2014   Chronic    Hereditary and idiopathic neuropathy 08/19/2013   History of kidney stones    HPV test positive    Hypercholesteremia 08/19/2013   Hypertension    Incomplete bladder emptying    Microscopic hematuria    MS (multiple sclerosis) (HCC)    Muscle spasticity 05/21/2014   Nonspecific findings on examination of urine    other   Osteopenia    PONV (postoperative nausea and vomiting)    Status post laparoscopic supracervical hysterectomy 11/26/2014   Tobacco user 11/26/2014   Wrist fracture    Past Surgical History:  Procedure Laterality Date   bilateral tubal ligation  1996   BREAST CYST EXCISION Left 2002   CYST EXCISION Left 05/10/2022   Procedure: CYST REMOVAL;  Surgeon: Carolan Shiver, MD;  Location: [facility] ORS;  Service: General;  Laterality: Left;   FRACTURE SURGERY     KNEE SURGERY     right   LAPAROSCOPIC SUPRACERVICAL HYSTERECTOMY  08/05/2013   ORIF WRIST FRACTURE Left 01/17/2017   Procedure: OPEN REDUCTION INTERNAL  FIXATION (ORIF) WRIST FRACTURE;  Surgeon: Lyndle Herrlich, MD;  Location: [facility] ORS;  Service: Orthopedics;  Laterality: Left;   RADIOLOGY WITH ANESTHESIA N/A 03/18/2020   Procedure: MRI WITH ANESTHESIA CERVICAL SPINE AND BRAIN  WITH AND WITHOUT CONTRAST;  Surgeon: Radiologist, Medication, MD;  Location: [facility] OR;  Service: Radiology;  Laterality: N/A;   TUBAL LIGATION Bilateral    VAGINAL HYSTERECTOMY  03/2006   Patient Active Problem List   Diagnosis Date Noted   Sinus tachycardia 07/07/2023   Hypokalemia 07/29/2022   Weakness of both lower extremities 07/29/2022   Abscess of left groin 11/15/2021   Abnormal LFTs 11/15/2021   Acute respiratory disease due to COVID-19 virus 11/21/2020   Weakness    Hypoalbuminemia due to protein-calorie malnutrition [facility])    Neurogenic bowel    Neurogenic bladder    Labile blood pressure    Neuropathic pain    Abscess of female pelvis    SVT (supraventricular tachycardia) (HCC)    Radial styloid tenosynovitis 03/12/2018   Wheelchair dependence 02/27/2018   Localized osteoporosis with current pathological fracture with routine healing 01/19/2017   Wrist fracture 01/16/2017   Sprain of ankle 03/23/2016   Closed fracture of lateral malleolus 03/16/2016   Health care maintenance 01/24/2016   Blood pressure elevated without history of HTN 10/25/2015  Essential hypertension 10/25/2015   Multiple sclerosis (HCC) 10/02/2015   Chronic left shoulder pain 07/19/2015   Multiple sclerosis exacerbation (HCC) 07/14/2015   MS (multiple sclerosis) (HCC) 11/26/2014   Increased body mass index 11/26/2014   HPV test positive 11/26/2014   Status post laparoscopic supracervical hysterectomy 11/26/2014   Galactorrhea 11/26/2014   Back ache 05/21/2014   Adiposity 05/21/2014   Disordered sleep 05/21/2014   Muscle spasticity 05/21/2014   Spasticity 05/21/2014   Calculus of kidney 12/09/2013   Renal colic 12/09/2013   Hypercholesteremia 08/19/2013   Hereditary and  idiopathic neuropathy 08/19/2013   Hypercholesterolemia without hypertriglyceridemia 08/19/2013   Bladder infection, chronic 07/25/2012   Disorder of bladder function 07/25/2012   Incomplete bladder emptying 07/25/2012   Microscopic hematuria 07/25/2012   Right upper quadrant pain 07/25/2012    ONSET DATE: 1995  REFERRING DIAG: MS  THERAPY DIAG:  Muscle weakness (generalized)  Difficulty in walking, not elsewhere classified  Unsteadiness on feet  Other abnormalities of gait and mobility  Rationale for Evaluation and Treatment: Rehabilitation  SUBJECTIVE:                                                                                                                                                                                             SUBJECTIVE STATEMENT: Patient reports she is feeling good. Has a doctor appt after her PT.  Pt accompanied by: self  PERTINENT HISTORY:  Patient is returning to PT s/p hospitalization.  s/p ORIF of R tibia shaft fracture 09/23/2022. Patient has weakness in BLE with RLE>LLE. She drives with hand controls. Patient has been diagnosed with MS in 1995. PMH includes: back pain, CBP, chronic L shoulder pain, galactorrhea, neuropathy, HPV, hypercholestermeia, HTN, MS, osteopenia, PONV, wrist fracture. Additional order for other closed fracture of proximal end of R tibia with routine healing. Still has to use [employer].   PAIN:  Are you having pain?  Occasional pain in RLE; primarily in knee  PRECAUTIONS: Fall  RED FLAGS: None   WEIGHT BEARING RESTRICTIONS: No  FALLS: Has patient fallen in last 6 months? No  LIVING ENVIRONMENT: Lives with: lives with their family Lives in: House/apartment Stairs:  ramp Has following equipment at home: Dan Humphreys - 2 wheeled, Wheelchair (manual), shower chair, and Grab bars  PLOF: Independent with household mobility with device  PATIENT GOALS: to get her independence back. To be able to get into car, toilet, and  get dressed independently  OBJECTIVE:  Note: Objective measures were completed at Evaluation unless otherwise noted.  DIAGNOSTIC FINDINGS: MRI of the brain 03/18/2020 showed multiple T2/FLAIR hyperintense foci in the periventricular, juxtacortical and  deep white matter.  There were no infratentorial lesions noted.  None of the foci enhanced.   Due to severe claustrophobia, the study was done with conscious sedation in the hospital   COGNITION: Overall cognitive status: Within functional limits for tasks assessed   SENSATION: Lack of sensation in feet, loss in bilateral lateral aspect of knee  COORDINATION: Does not have the strength for functional LE heel slide test     MUSCLE TONE: BLE mild tone    POSTURE: rounded shoulders, forward head, anterior pelvic tilt, and weight shift left    LOWER EXTREMITY MMT:    MMT Right Eval Left Eval  Hip flexion 0.9 1.5  Hip extension    Hip abduction 1.8 2.6  Hip adduction 3.1 1.9  Hip internal rotation    Hip external rotation    Knee flexion 0.9 1.5  Knee extension 0.8 1.2  Ankle dorsiflexion    Ankle plantarflexion    Ankle inversion    Ankle eversion    (Blank rows = not tested)  BED MOBILITY:  Assess in future session due to limited time  TRANSFERS: Assistive device utilized:  Bariatric RW with sit to stands, slide board to table   Sit to stand:  unable to reach full stand Stand to sit:  unable to reach full stand  Chair to chair:  slide board with CGA     GAIT: Unable to ambulate at this time.   FUNCTIONAL TESTS:  Sit to stand: tricep press with BUE; unable to bring arm to walker.    Function In Sitting Test (FIST)  (1/2 femur on surface; hips/knees flexed to 90deg)   - indicate bed or mat table / step stool if used  SCORING KEY: 4 = Independent (completes task independently & successfully) 3 = Verbal Cues/Increased Time (completes task independently & successfully and only needs more time/cues) 2 = Upper  Extremity Support (must use UE for support or assistance to complete successfully) 1 = Needs Assistance (unable to complete w/o physical assist; DOCUMENT LEVEL: min, mod, max) 0 = Dependent (requires complete physical assist; unable to complete successfully even w/ physical assist)  Randomly Administer Once Throughout Exam  4 - Anterior Nudge (superior sternum)  4 - Posterior Nudge (between scapular spines)  4 - Lateral Nudge (to dominant side at acromion)     4 - Static sitting (30 seconds)  4 - Sitting, shake 'no' (left and right)  4 - Sitting, eyes closed (30 seconds)   0 - Sitting, lift foot (dominant side, lift foot 1 inch twice)    2 - Pick up object from behind (object at midline, hands breadth posterior)  3 - Forward reach (use dominant arm, must complete full motion) 2 - Lateral reach (use dominant arm, clear opposite ischial tuberosity) 2 - Pick up object from floor (from between feet)   2 - Posterior scooting (move backwards 2 inches)  2 - Anterior scooting (move forward 2 inches)  2 - Lateral scooting (move to dominant side 2 inches)    TOTAL = 39/56  Notes/comments: slide board tranfer to/from table   MCD > 5 points MCID for IP REHAB > 6 points  TREATMENT DATE: 07/24/23 TherAct:  All activities performed with gait belt and CGA unless otherwise noted.    In // bars: Sit to stand; ambulate 4 steps with assistance wheelchair follow and cues for R weight shift to clear L foot due to limited acceptance of weight on RLE Third-fifth attempt stand unsuccessful    TherEx Seated: Cable machine:  - #12.5 row 10x; 2 sets  -#7.5 single arm lat pull down 10x; 2 sets   RTB abduction 15x RTB adduction 15x Isometric LE press 15x each LE Hip flexion isometric 10x each LE     Pt requires significant therapeutic rest breaks throughout the  session due to fatigue.    PATIENT EDUCATION: Education details: goals, POC, HEP  Person educated: Patient Education method: Explanation, Demonstration, Tactile cues, Verbal cues, and Handouts Education comprehension: verbalized understanding, returned demonstration, verbal cues required, tactile cues required, and needs further education  HOME EXERCISE PROGRAM: Stand 3x/day in stander   GOALS: Goals reviewed with patient? Yes  SHORT TERM GOALS: Target date: 08/08/2023    Patient will be independent in home exercise program to improve strength/mobility for better functional independence with ADLs.  Baseline: Goal status: INITIAL   Goal status: INITIAL  LONG TERM GOALS: Target date: 10/04/2023    Patient will tolerate five consecutive stands with UE support from wheelchair to standing to improve functional mobility.  Baseline: unable to perform  Goal status: INITIAL  2.  Patient will ambulate 10 ft with bRW with wheelchair follow.  Baseline:  unable to ambulate Goal status: INITIAL  3.  Patient will improve FIST score >6 points to demonstrate improved stability and ability to perform ADLs.  Baseline:  3/6: 39/56 Goal status: INITIAL  4.  Patient will perform toileting with mod I at home for improved independence.  Baseline: 3/6: requires assistance Goal status: INITIAL    ASSESSMENT:  CLINICAL IMPRESSION: Patient is able to ambulate four steps this session, this is first time she has been able to take steps in // bars since hospitalization. She is highly motivated for progression of independence and mobility but does have significant fatigue with standing. Patient will benefit from skilled physical therapy to improve mobility, stability, and independence.   OBJECTIVE IMPAIRMENTS: Abnormal gait, cardiopulmonary status limiting activity, decreased activity tolerance, decreased balance, decreased coordination, decreased endurance, decreased mobility, difficulty walking,  decreased ROM, decreased strength, hypomobility, increased fascial restrictions, impaired perceived functional ability, impaired flexibility, impaired sensation, improper body mechanics, and postural dysfunction.   ACTIVITY LIMITATIONS: carrying, lifting, bending, sitting, standing, squatting, sleeping, stairs, transfers, bed mobility, continence, bathing, toileting, dressing, self feeding, reach over head, hygiene/grooming, locomotion level, and caring for others  PARTICIPATION LIMITATIONS: meal prep, cleaning, laundry, interpersonal relationship, driving, shopping, community activity, and church  PERSONAL FACTORS: Age, Past/current experiences, Time since onset of injury/illness/exacerbation, Transportation, and 3+ comorbidities: ack pain, CBP, chronic L shoulder pain, galactorrhea, neuropathy, HPV, hypercholestermeia, HTN, MS, osteopenia, PONV, wrist fracture  are also affecting patient's functional outcome.   REHAB POTENTIAL: Good  CLINICAL DECISION MAKING: Evolving/moderate complexity  EVALUATION COMPLEXITY: Moderate  PLAN:  PT FREQUENCY: 2x/week  PT DURATION: 12 weeks  PLANNED INTERVENTIONS: 97164- PT Re-evaluation, 97110-Therapeutic exercises, 97530- Therapeutic activity, 97112- Neuromuscular re-education, 97535- Self Care, 40981- Manual therapy, [phone]- Gait training, [phone]- Orthotic Fit/training, [phone]- Canalith repositioning, [id number]- Aquatic Therapy, [phone]- Electrical stimulation (unattended), [phone]- Electrical stimulation (manual), [id number]- Ultrasound, 52841- Traction (mechanical), Patient/Family education, Balance training, Stair training, Taping, Dry Needling, Joint mobilization, Joint manipulation, Spinal manipulation, Spinal mobilization, Scar mobilization, Compression  bandaging, Vestibular training, Visual/preceptual remediation/compensation, Cognitive remediation, DME instructions, Cryotherapy, Moist heat, and Biofeedback  PLAN FOR NEXT SESSION: sit to stands    Precious Bard,  PT 07/24/2023, 11:31 AM

## 2023-07-24 ENCOUNTER — Ambulatory Visit: Payer: PPO

## 2023-07-24 DIAGNOSIS — G35 Multiple sclerosis: Secondary | ICD-10-CM | POA: Diagnosis not present

## 2023-07-24 DIAGNOSIS — R7303 Prediabetes: Secondary | ICD-10-CM | POA: Diagnosis not present

## 2023-07-24 DIAGNOSIS — R2681 Unsteadiness on feet: Secondary | ICD-10-CM

## 2023-07-24 DIAGNOSIS — E669 Obesity, unspecified: Secondary | ICD-10-CM | POA: Diagnosis not present

## 2023-07-24 DIAGNOSIS — R2689 Other abnormalities of gait and mobility: Secondary | ICD-10-CM

## 2023-07-24 DIAGNOSIS — R262 Difficulty in walking, not elsewhere classified: Secondary | ICD-10-CM

## 2023-07-24 DIAGNOSIS — M6281 Muscle weakness (generalized): Secondary | ICD-10-CM | POA: Diagnosis not present

## 2023-07-24 DIAGNOSIS — I10 Essential (primary) hypertension: Secondary | ICD-10-CM | POA: Diagnosis not present

## 2023-07-24 DIAGNOSIS — Z0000 Encounter for general adult medical examination without abnormal findings: Secondary | ICD-10-CM | POA: Diagnosis not present

## 2023-07-25 NOTE — Therapy (Signed)
 OUTPATIENT PHYSICAL THERAPY NEURO TREATMENT   Patient Name: Lisa Williams MRN: [id number] DOB:1961/04/07, 63 y.o., female Today's Date: 07/26/2023   PCP: Einar Crow  REFERRING PROVIDER: Einar Crow  END OF SESSION:  PT End of Session - 07/26/23 1013     Visit Number 5    Number of Visits 24    Date for PT Re-Evaluation 10/04/23    PT Start Time 1015    PT Stop Time 1059    PT Time Calculation (min) 44 min    Equipment Utilized During Treatment Gait belt    Activity Tolerance Patient tolerated treatment well;No increased pain    Behavior During Therapy [facility] for tasks assessed/performed                Past Medical History:  Diagnosis Date   Abdominal pain, right upper quadrant    Back pain    Calculus of kidney 12/09/2013   Chronic back pain    unspecified   Chronic left shoulder pain 07/19/2015   Complication of anesthesia    Functional disorder of bladder    other   Galactorrhea 11/26/2014   Chronic    Hereditary and idiopathic neuropathy 08/19/2013   History of kidney stones    HPV test positive    Hypercholesteremia 08/19/2013   Hypertension    Incomplete bladder emptying    Microscopic hematuria    MS (multiple sclerosis) (HCC)    Muscle spasticity 05/21/2014   Nonspecific findings on examination of urine    other   Osteopenia    PONV (postoperative nausea and vomiting)    Status post laparoscopic supracervical hysterectomy 11/26/2014   Tobacco user 11/26/2014   Wrist fracture    Past Surgical History:  Procedure Laterality Date   bilateral tubal ligation  1996   BREAST CYST EXCISION Left 2002   CYST EXCISION Left 05/10/2022   Procedure: CYST REMOVAL;  Surgeon: Carolan Shiver, MD;  Location: [facility] ORS;  Service: General;  Laterality: Left;   FRACTURE SURGERY     KNEE SURGERY     right   LAPAROSCOPIC SUPRACERVICAL HYSTERECTOMY  08/05/2013   ORIF WRIST FRACTURE Left 01/17/2017   Procedure: OPEN REDUCTION INTERNAL  FIXATION (ORIF) WRIST FRACTURE;  Surgeon: Lyndle Herrlich, MD;  Location: [facility] ORS;  Service: Orthopedics;  Laterality: Left;   RADIOLOGY WITH ANESTHESIA N/A 03/18/2020   Procedure: MRI WITH ANESTHESIA CERVICAL SPINE AND BRAIN  WITH AND WITHOUT CONTRAST;  Surgeon: Radiologist, Medication, MD;  Location: [facility] OR;  Service: Radiology;  Laterality: N/A;   TUBAL LIGATION Bilateral    VAGINAL HYSTERECTOMY  03/2006   Patient Active Problem List   Diagnosis Date Noted   Sinus tachycardia 07/07/2023   Hypokalemia 07/29/2022   Weakness of both lower extremities 07/29/2022   Abscess of left groin 11/15/2021   Abnormal LFTs 11/15/2021   Acute respiratory disease due to COVID-19 virus 11/21/2020   Weakness    Hypoalbuminemia due to protein-calorie malnutrition [facility])    Neurogenic bowel    Neurogenic bladder    Labile blood pressure    Neuropathic pain    Abscess of female pelvis    SVT (supraventricular tachycardia) (HCC)    Radial styloid tenosynovitis 03/12/2018   Wheelchair dependence 02/27/2018   Localized osteoporosis with current pathological fracture with routine healing 01/19/2017   Wrist fracture 01/16/2017   Sprain of ankle 03/23/2016   Closed fracture of lateral malleolus 03/16/2016   Health care maintenance 01/24/2016   Blood pressure elevated without history of HTN  10/25/2015   Essential hypertension 10/25/2015   Multiple sclerosis (HCC) 10/02/2015   Chronic left shoulder pain 07/19/2015   Multiple sclerosis exacerbation (HCC) 07/14/2015   MS (multiple sclerosis) (HCC) 11/26/2014   Increased body mass index 11/26/2014   HPV test positive 11/26/2014   Status post laparoscopic supracervical hysterectomy 11/26/2014   Galactorrhea 11/26/2014   Back ache 05/21/2014   Adiposity 05/21/2014   Disordered sleep 05/21/2014   Muscle spasticity 05/21/2014   Spasticity 05/21/2014   Calculus of kidney 12/09/2013   Renal colic 12/09/2013   Hypercholesteremia 08/19/2013   Hereditary and  idiopathic neuropathy 08/19/2013   Hypercholesterolemia without hypertriglyceridemia 08/19/2013   Bladder infection, chronic 07/25/2012   Disorder of bladder function 07/25/2012   Incomplete bladder emptying 07/25/2012   Microscopic hematuria 07/25/2012   Right upper quadrant pain 07/25/2012    ONSET DATE: 1995  REFERRING DIAG: MS  THERAPY DIAG:  Muscle weakness (generalized)  Difficulty in walking, not elsewhere classified  Unsteadiness on feet  Other abnormalities of gait and mobility  Rationale for Evaluation and Treatment: Rehabilitation  SUBJECTIVE:                                                                                                                                                                                             SUBJECTIVE STATEMENT: Patient reports she lost 13 lb.  Is feeling good.  Pt accompanied by: self  PERTINENT HISTORY:  Patient is returning to PT s/p hospitalization.  s/p ORIF of R tibia shaft fracture 09/23/2022. Patient has weakness in BLE with RLE>LLE. She drives with hand controls. Patient has been diagnosed with MS in 1995. PMH includes: back pain, CBP, chronic L shoulder pain, galactorrhea, neuropathy, HPV, hypercholestermeia, HTN, MS, osteopenia, PONV, wrist fracture. Additional order for other closed fracture of proximal end of R tibia with routine healing. Still has to use [employer].   PAIN:  Are you having pain?  Occasional pain in RLE; primarily in knee  PRECAUTIONS: Fall  RED FLAGS: None   WEIGHT BEARING RESTRICTIONS: No  FALLS: Has patient fallen in last 6 months? No  LIVING ENVIRONMENT: Lives with: lives with their family Lives in: House/apartment Stairs:  ramp Has following equipment at home: Dan Humphreys - 2 wheeled, Wheelchair (manual), shower chair, and Grab bars  PLOF: Independent with household mobility with device  PATIENT GOALS: to get her independence back. To be able to get into car, toilet, and get dressed  independently  OBJECTIVE:  Note: Objective measures were completed at Evaluation unless otherwise noted.  DIAGNOSTIC FINDINGS: MRI of the brain 03/18/2020 showed multiple T2/FLAIR hyperintense foci in the periventricular, juxtacortical and  deep white matter.  There were no infratentorial lesions noted.  None of the foci enhanced.   Due to severe claustrophobia, the study was done with conscious sedation in the hospital   COGNITION: Overall cognitive status: Within functional limits for tasks assessed   SENSATION: Lack of sensation in feet, loss in bilateral lateral aspect of knee  COORDINATION: Does not have the strength for functional LE heel slide test     MUSCLE TONE: BLE mild tone    POSTURE: rounded shoulders, forward head, anterior pelvic tilt, and weight shift left    LOWER EXTREMITY MMT:    MMT Right Eval Left Eval  Hip flexion 0.9 1.5  Hip extension    Hip abduction 1.8 2.6  Hip adduction 3.1 1.9  Hip internal rotation    Hip external rotation    Knee flexion 0.9 1.5  Knee extension 0.8 1.2  Ankle dorsiflexion    Ankle plantarflexion    Ankle inversion    Ankle eversion    (Blank rows = not tested)  BED MOBILITY:  Assess in future session due to limited time  TRANSFERS: Assistive device utilized:  Bariatric RW with sit to stands, slide board to table   Sit to stand:  unable to reach full stand Stand to sit:  unable to reach full stand  Chair to chair:  slide board with CGA     GAIT: Unable to ambulate at this time.   FUNCTIONAL TESTS:  Sit to stand: tricep press with BUE; unable to bring arm to walker.    Function In Sitting Test (FIST)  (1/2 femur on surface; hips/knees flexed to 90deg)   - indicate bed or mat table / step stool if used  SCORING KEY: 4 = Independent (completes task independently & successfully) 3 = Verbal Cues/Increased Time (completes task independently & successfully and only needs more time/cues) 2 = Upper Extremity  Support (must use UE for support or assistance to complete successfully) 1 = Needs Assistance (unable to complete w/o physical assist; DOCUMENT LEVEL: min, mod, max) 0 = Dependent (requires complete physical assist; unable to complete successfully even w/ physical assist)  Randomly Administer Once Throughout Exam  4 - Anterior Nudge (superior sternum)  4 - Posterior Nudge (between scapular spines)  4 - Lateral Nudge (to dominant side at acromion)     4 - Static sitting (30 seconds)  4 - Sitting, shake 'no' (left and right)  4 - Sitting, eyes closed (30 seconds)   0 - Sitting, lift foot (dominant side, lift foot 1 inch twice)    2 - Pick up object from behind (object at midline, hands breadth posterior)  3 - Forward reach (use dominant arm, must complete full motion) 2 - Lateral reach (use dominant arm, clear opposite ischial tuberosity) 2 - Pick up object from floor (from between feet)   2 - Posterior scooting (move backwards 2 inches)  2 - Anterior scooting (move forward 2 inches)  2 - Lateral scooting (move to dominant side 2 inches)    TOTAL = 39/56  Notes/comments: slide board tranfer to/from table   MCD > 5 points MCID for IP REHAB > 6 points  TREATMENT DATE: 07/26/23 TherAct:  All activities performed with gait belt and CGA unless otherwise noted.    Bariatric RW at position 4: seated in RW with pillow in chair -first attempt: sit to stand unable to reach full stand -second -8th attempt: unsuccessful full stands but able to reach partial stand with R hand on walker  -mini sit to stand pressing with LE's only 10x; very challenging for patient   TherEx Seated boxing: cues for sequencing, body mechanics, and pace/rhythm for functional contraction and timing of muscle recruitment: -Cross body punches to mitts on PT hands with no back support for  focused core stabilization with crossing midline 2x 60 seconds  -Cross body punch with secondary combination of elbow for full cross body rotation to PT mitt for trunk stability, coordination, and cardiovascular challenge x 60 seconds -Upper cut to mitts in PT hands for core activation without back support with perturbations 60 seconds     Pt requires significant therapeutic rest breaks throughout the session due to fatigue.    PATIENT EDUCATION: Education details: goals, POC, HEP  Person educated: Patient Education method: Explanation, Demonstration, Tactile cues, Verbal cues, and Handouts Education comprehension: verbalized understanding, returned demonstration, verbal cues required, tactile cues required, and needs further education  HOME EXERCISE PROGRAM: Stand 3x/day in stander   GOALS: Goals reviewed with patient? Yes  SHORT TERM GOALS: Target date: 08/08/2023    Patient will be independent in home exercise program to improve strength/mobility for better functional independence with ADLs.  Baseline: Goal status: INITIAL   Goal status: INITIAL  LONG TERM GOALS: Target date: 10/04/2023    Patient will tolerate five consecutive stands with UE support from wheelchair to standing to improve functional mobility.  Baseline: unable to perform  Goal status: INITIAL  2.  Patient will ambulate 10 ft with bRW with wheelchair follow.  Baseline:  unable to ambulate Goal status: INITIAL  3.  Patient will improve FIST score >6 points to demonstrate improved stability and ability to perform ADLs.  Baseline:  3/6: 39/56 Goal status: INITIAL  4.  Patient will perform toileting with mod I at home for improved independence.  Baseline: 3/6: requires assistance Goal status: INITIAL    ASSESSMENT:  CLINICAL IMPRESSION: Referral for occupational therapy for ADL performance tolerated sent to physician, patient agreeable with plan. Patient is eager to progress her mobility and  independence. She is able to perform partial stand but not able to get both hands on walker from wheelchair.   Patient will benefit from skilled physical therapy to improve mobility, stability, and independence.   OBJECTIVE IMPAIRMENTS: Abnormal gait, cardiopulmonary status limiting activity, decreased activity tolerance, decreased balance, decreased coordination, decreased endurance, decreased mobility, difficulty walking, decreased ROM, decreased strength, hypomobility, increased fascial restrictions, impaired perceived functional ability, impaired flexibility, impaired sensation, improper body mechanics, and postural dysfunction.   ACTIVITY LIMITATIONS: carrying, lifting, bending, sitting, standing, squatting, sleeping, stairs, transfers, bed mobility, continence, bathing, toileting, dressing, self feeding, reach over head, hygiene/grooming, locomotion level, and caring for others  PARTICIPATION LIMITATIONS: meal prep, cleaning, laundry, interpersonal relationship, driving, shopping, community activity, and church  PERSONAL FACTORS: Age, Past/current experiences, Time since onset of injury/illness/exacerbation, Transportation, and 3+ comorbidities: ack pain, CBP, chronic L shoulder pain, galactorrhea, neuropathy, HPV, hypercholestermeia, HTN, MS, osteopenia, PONV, wrist fracture  are also affecting patient's functional outcome.   REHAB POTENTIAL: Good  CLINICAL DECISION MAKING: Evolving/moderate complexity  EVALUATION COMPLEXITY: Moderate  PLAN:  PT FREQUENCY: 2x/week  PT DURATION: 12 weeks  PLANNED INTERVENTIONS:  16109- PT Re-evaluation, 97110-Therapeutic exercises, 97530- Therapeutic activity, [id number]- Neuromuscular re-education, [phone]- Self Care, 09811- Manual therapy, [phone]- Gait training, [phone]- Orthotic Fit/training, [phone]- Canalith repositioning, [id number]- Aquatic Therapy, [phone]- Electrical stimulation (unattended), [phone]- Electrical stimulation (manual), [id number]- Ultrasound, [id number]- Traction  (mechanical), Patient/Family education, Balance training, Stair training, Taping, Dry Needling, Joint mobilization, Joint manipulation, Spinal manipulation, Spinal mobilization, Scar mobilization, Compression bandaging, Vestibular training, Visual/preceptual remediation/compensation, Cognitive remediation, DME instructions, Cryotherapy, Moist heat, and Biofeedback  PLAN FOR NEXT SESSION: sit to stands; try supported walking    Precious Bard, PT 07/26/2023, 11:06 AM

## 2023-07-26 ENCOUNTER — Ambulatory Visit: Payer: PPO

## 2023-07-26 DIAGNOSIS — M6281 Muscle weakness (generalized): Secondary | ICD-10-CM | POA: Diagnosis not present

## 2023-07-26 DIAGNOSIS — R2689 Other abnormalities of gait and mobility: Secondary | ICD-10-CM

## 2023-07-26 DIAGNOSIS — R2681 Unsteadiness on feet: Secondary | ICD-10-CM

## 2023-07-26 DIAGNOSIS — R262 Difficulty in walking, not elsewhere classified: Secondary | ICD-10-CM

## 2023-07-28 DIAGNOSIS — M810 Age-related osteoporosis without current pathological fracture: Secondary | ICD-10-CM | POA: Diagnosis not present

## 2023-07-28 DIAGNOSIS — M6281 Muscle weakness (generalized): Secondary | ICD-10-CM | POA: Diagnosis not present

## 2023-07-28 DIAGNOSIS — R262 Difficulty in walking, not elsewhere classified: Secondary | ICD-10-CM | POA: Diagnosis not present

## 2023-07-28 DIAGNOSIS — G35 Multiple sclerosis: Secondary | ICD-10-CM | POA: Diagnosis not present

## 2023-07-30 NOTE — Therapy (Signed)
 OUTPATIENT PHYSICAL THERAPY NEURO TREATMENT   Patient Name: Lisa Williams MRN: [id number] DOB:[birth year], 63 y.o., female Today's Date: 07/31/2023   PCP: Einar Crow  REFERRING PROVIDER: Einar Crow  END OF SESSION:  PT End of Session - 07/31/23 1244     Visit Number 6    Number of Visits 24    Date for PT Re-Evaluation 10/04/23    PT Start Time 1015    PT Stop Time 1059    PT Time Calculation (min) 44 min    Equipment Utilized During Treatment Gait belt    Activity Tolerance Patient tolerated treatment well;No increased pain    Behavior During Therapy [facility] for tasks assessed/performed                 Past Medical History:  Diagnosis Date   Abdominal pain, right upper quadrant    Back pain    Calculus of kidney 12/09/2013   Chronic back pain    unspecified   Chronic left shoulder pain 07/19/2015   Complication of anesthesia    Functional disorder of bladder    other   Galactorrhea 11/26/2014   Chronic    Hereditary and idiopathic neuropathy 08/19/2013   History of kidney stones    HPV test positive    Hypercholesteremia 08/19/2013   Hypertension    Incomplete bladder emptying    Microscopic hematuria    MS (multiple sclerosis) (HCC)    Muscle spasticity 05/21/2014   Nonspecific findings on examination of urine    other   Osteopenia    PONV (postoperative nausea and vomiting)    Status post laparoscopic supracervical hysterectomy 11/26/2014   Tobacco user 11/26/2014   Wrist fracture    Past Surgical History:  Procedure Laterality Date   bilateral tubal ligation  1996   BREAST CYST EXCISION Left 2002   CYST EXCISION Left 05/10/2022   Procedure: CYST REMOVAL;  Surgeon: Carolan Shiver, MD;  Location: [facility] ORS;  Service: General;  Laterality: Left;   FRACTURE SURGERY     KNEE SURGERY     right   LAPAROSCOPIC SUPRACERVICAL HYSTERECTOMY  08/05/2013   ORIF WRIST FRACTURE Left 01/17/2017   Procedure: OPEN REDUCTION INTERNAL  FIXATION (ORIF) WRIST FRACTURE;  Surgeon: Lyndle Herrlich, MD;  Location: [facility] OR[facility];  Service: Orthopedics;  Laterality: Left;   RADIOLOGY WITH ANESTHESIA N/A 03/18/2020   Procedure: MRI WITH ANESTHESIA CERVICAL SPINE AND BRAIN  WITH AND WITHOUT CONTRAST;  Surgeon: Radiologist, Medication, MD;  Location: [facility] OR;  Service: Radiology;  Laterality: N/A;   TUBAL LIGATION Bilateral    VAGINAL HYSTERECTOMY  03/2006   Patient Active Problem List   Diagnosis Date Noted   Sinus tachycardia 07/07/2023   Hypokalemia 07/29/2022   Weakness of both lower extremities 07/29/2022   Abscess of left groin 11/15/2021   Abnormal LFTs 11/15/2021   Acute respiratory disease due to COVID-19 virus 11/21/2020   Weakness    Hypoalbuminemia due to protein-calorie malnutrition [facility])    Neurogenic bowel    Neurogenic bladder    Labile blood pressure    Neuropathic pain    Abscess of female pelvis    SVT (supraventricular tachycardia) (HCC)    Radial styloid tenosynovitis 03/12/2018   Wheelchair dependence 02/27/2018   Localized osteoporosis with current pathological fracture with routine healing 01/19/2017   Wrist fracture 01/16/2017   Sprain of ankle 03/23/2016   Closed fracture of lateral malleolus 03/16/2016   Health care maintenance 01/24/2016   Blood pressure elevated without history of  HTN 10/25/2015   Essential hypertension 10/25/2015   Multiple sclerosis (HCC) 10/02/2015   Chronic left shoulder pain 07/19/2015   Multiple sclerosis exacerbation (HCC) 07/14/2015   MS (multiple sclerosis) (HCC) 11/26/2014   Increased body mass index 11/26/2014   HPV test positive 11/26/2014   Status post laparoscopic supracervical hysterectomy 11/26/2014   Galactorrhea 11/26/2014   Back ache 05/21/2014   Adiposity 05/21/2014   Disordered sleep 05/21/2014   Muscle spasticity 05/21/2014   Spasticity 05/21/2014   Calculus of kidney 12/09/2013   Renal colic 12/09/2013   Hypercholesteremia 08/19/2013   Hereditary and  idiopathic neuropathy 08/19/2013   Hypercholesterolemia without hypertriglyceridemia 08/19/2013   Bladder infection, chronic 07/25/2012   Disorder of bladder function 07/25/2012   Incomplete bladder emptying 07/25/2012   Microscopic hematuria 07/25/2012   Right upper quadrant pain 07/25/2012    ONSET DATE: 1995  REFERRING DIAG: MS  THERAPY DIAG:  Muscle weakness (generalized)  Difficulty in walking, not elsewhere classified  Unsteadiness on feet  Other abnormalities of gait and mobility  Rationale for Evaluation and Treatment: Rehabilitation  SUBJECTIVE:                                                                                                                                                                                             SUBJECTIVE STATEMENT: Patient is highly motivated to try the Lite Gait  Pt accompanied by: self  PERTINENT HISTORY:  Patient is returning to PT s/p hospitalization.  s/p ORIF of R tibia shaft fracture 09/23/2022. Patient has weakness in BLE with RLE>LLE. She drives with hand controls. Patient has been diagnosed with MS in 1995. PMH includes: back pain, CBP, chronic L shoulder pain, galactorrhea, neuropathy, HPV, hypercholestermeia, HTN, MS, osteopenia, PONV, wrist fracture. Additional order for other closed fracture of proximal end of R tibia with routine healing. Still has to use [employer].   PAIN:  Are you having pain?  Occasional pain in RLE; primarily in knee  PRECAUTIONS: Fall  RED FLAGS: None   WEIGHT BEARING RESTRICTIONS: No  FALLS: Has patient fallen in last 6 months? No  LIVING ENVIRONMENT: Lives with: lives with their family Lives in: House/apartment Stairs:  ramp Has following equipment at home: Dan Humphreys - 2 wheeled, Wheelchair (manual), shower chair, and Grab bars  PLOF: Independent with household mobility with device  PATIENT GOALS: to get her independence back. To be able to get into car, toilet, and get dressed  independently  OBJECTIVE:  Note: Objective measures were completed at Evaluation unless otherwise noted.  DIAGNOSTIC FINDINGS: MRI of the brain 03/18/2020 showed multiple T2/FLAIR hyperintense foci in the periventricular, juxtacortical and  deep white matter.  There were no infratentorial lesions noted.  None of the foci enhanced.   Due to severe claustrophobia, the study was done with conscious sedation in the hospital   COGNITION: Overall cognitive status: Within functional limits for tasks assessed   SENSATION: Lack of sensation in feet, loss in bilateral lateral aspect of knee  COORDINATION: Does not have the strength for functional LE heel slide test     MUSCLE TONE: BLE mild tone    POSTURE: rounded shoulders, forward head, anterior pelvic tilt, and weight shift left    LOWER EXTREMITY MMT:    MMT Right Eval Left Eval  Hip flexion 0.9 1.5  Hip extension    Hip abduction 1.8 2.6  Hip adduction 3.1 1.9  Hip internal rotation    Hip external rotation    Knee flexion 0.9 1.5  Knee extension 0.8 1.2  Ankle dorsiflexion    Ankle plantarflexion    Ankle inversion    Ankle eversion    (Blank rows = not tested)  BED MOBILITY:  Assess in future session due to limited time  TRANSFERS: Assistive device utilized:  Bariatric RW with sit to stands, slide board to table   Sit to stand:  unable to reach full stand Stand to sit:  unable to reach full stand  Chair to chair:  slide board with CGA     GAIT: Unable to ambulate at this time.   FUNCTIONAL TESTS:  Sit to stand: tricep press with BUE; unable to bring arm to walker.    Function In Sitting Test (FIST)  (1/2 femur on surface; hips/knees flexed to 90deg)   - indicate bed or mat table / step stool if used  SCORING KEY: 4 = Independent (completes task independently & successfully) 3 = Verbal Cues/Increased Time (completes task independently & successfully and only needs more time/cues) 2 = Upper Extremity  Support (must use UE for support or assistance to complete successfully) 1 = Needs Assistance (unable to complete w/o physical assist; DOCUMENT LEVEL: min, mod, max) 0 = Dependent (requires complete physical assist; unable to complete successfully even w/ physical assist)  Randomly Administer Once Throughout Exam  4 - Anterior Nudge (superior sternum)  4 - Posterior Nudge (between scapular spines)  4 - Lateral Nudge (to dominant side at acromion)     4 - Static sitting (30 seconds)  4 - Sitting, shake 'no' (left and right)  4 - Sitting, eyes closed (30 seconds)   0 - Sitting, lift foot (dominant side, lift foot 1 inch twice)    2 - Pick up object from behind (object at midline, hands breadth posterior)  3 - Forward reach (use dominant arm, must complete full motion) 2 - Lateral reach (use dominant arm, clear opposite ischial tuberosity) 2 - Pick up object from floor (from between feet)   2 - Posterior scooting (move backwards 2 inches)  2 - Anterior scooting (move forward 2 inches)  2 - Lateral scooting (move to dominant side 2 inches)    TOTAL = 39/56  Notes/comments: slide board tranfer to/from table   MCD > 5 points MCID for IP REHAB > 6 points  TREATMENT DATE: 07/31/23 TherAct:  All activities performed with gait belt and CGA unless otherwise noted.    Lite Gait: x3 person assist: -sit to stand in // bars to don harness x 2 person assist -two attempts: First attempt four steps with patient requiring max cueing for breathing; wheelchair follow with BrW. Cues for bringing stomach forward , shifting weight and lifting LE's. Min A for RLE stepping forward x 2 attempts.    TherEx 5lb weighted bar:  -chest press 10x;  -overhead press 10x -witches brew 10x clockwise, 10x counterclockwise -bicep curl 10x -preacher curl 15x       Pt requires  significant therapeutic rest breaks throughout the session due to fatigue.    PATIENT EDUCATION: Education details: goals, POC, HEP  Person educated: Patient Education method: Explanation, Demonstration, Tactile cues, Verbal cues, and Handouts Education comprehension: verbalized understanding, returned demonstration, verbal cues required, tactile cues required, and needs further education  HOME EXERCISE PROGRAM: Stand 3x/day in stander   GOALS: Goals reviewed with patient? Yes  SHORT TERM GOALS: Target date: 08/08/2023    Patient will be independent in home exercise program to improve strength/mobility for better functional independence with ADLs.  Baseline: Goal status: INITIAL   Goal status: INITIAL  LONG TERM GOALS: Target date: 10/04/2023    Patient will tolerate five consecutive stands with UE support from wheelchair to standing to improve functional mobility.  Baseline: unable to perform  Goal status: INITIAL  2.  Patient will ambulate 10 ft with bRW with wheelchair follow.  Baseline:  unable to ambulate Goal status: INITIAL  3.  Patient will improve FIST score >6 points to demonstrate improved stability and ability to perform ADLs.  Baseline:  3/6: 39/56 Goal status: INITIAL  4.  Patient will perform toileting with mod I at home for improved independence.  Baseline: 3/6: requires assistance Goal status: INITIAL    ASSESSMENT:  CLINICAL IMPRESSION: Patient is able to ambulate 4 steps two times in a row with use of LiteGait. She requires extensive cueing and assistance for RLE forward momentum. Donning of LiteGait brace performed in standing in // bars. Patient very fatigued by end of standing with reports of light headedness.   Patient will benefit from skilled physical therapy to improve mobility, stability, and independence.   OBJECTIVE IMPAIRMENTS: Abnormal gait, cardiopulmonary status limiting activity, decreased activity tolerance, decreased balance,  decreased coordination, decreased endurance, decreased mobility, difficulty walking, decreased ROM, decreased strength, hypomobility, increased fascial restrictions, impaired perceived functional ability, impaired flexibility, impaired sensation, improper body mechanics, and postural dysfunction.   ACTIVITY LIMITATIONS: carrying, lifting, bending, sitting, standing, squatting, sleeping, stairs, transfers, bed mobility, continence, bathing, toileting, dressing, self feeding, reach over head, hygiene/grooming, locomotion level, and caring for others  PARTICIPATION LIMITATIONS: meal prep, cleaning, laundry, interpersonal relationship, driving, shopping, community activity, and church  PERSONAL FACTORS: Age, Past/current experiences, Time since onset of injury/illness/exacerbation, Transportation, and 3+ comorbidities: ack pain, CBP, chronic L shoulder pain, galactorrhea, neuropathy, HPV, hypercholestermeia, HTN, MS, osteopenia, PONV, wrist fracture  are also affecting patient's functional outcome.   REHAB POTENTIAL: Good  CLINICAL DECISION MAKING: Evolving/moderate complexity  EVALUATION COMPLEXITY: Moderate  PLAN:  PT FREQUENCY: 2x/week  PT DURATION: 12 weeks  PLANNED INTERVENTIONS: 97164- PT Re-evaluation, 97110-Therapeutic exercises, 97530- Therapeutic activity, [id number]- Neuromuscular re-education, 97535- Self Care, [id number]- Manual therapy, [id number]- Gait training, [phone]- Orthotic Fit/training, [phone]- Canalith repositioning, [id number]- Aquatic Therapy, [phone]- Electrical stimulation (unattended), [id number]- Electrical stimulation (manual), [id number]- Ultrasound, [id number]- Traction (mechanical), Patient/Family education, Balance training, Stair training, Taping,  Dry Needling, Joint mobilization, Joint manipulation, Spinal manipulation, Spinal mobilization, Scar mobilization, Compression bandaging, Vestibular training, Visual/preceptual remediation/compensation, Cognitive remediation, DME instructions, Cryotherapy, Moist  heat, and Biofeedback  PLAN FOR NEXT SESSION: sit to stands; try supported walking    Precious Bard, PT 07/31/2023, 3:08 PM

## 2023-07-31 ENCOUNTER — Ambulatory Visit: Payer: PPO

## 2023-07-31 DIAGNOSIS — M6281 Muscle weakness (generalized): Secondary | ICD-10-CM

## 2023-07-31 DIAGNOSIS — R262 Difficulty in walking, not elsewhere classified: Secondary | ICD-10-CM

## 2023-07-31 DIAGNOSIS — R2681 Unsteadiness on feet: Secondary | ICD-10-CM

## 2023-07-31 DIAGNOSIS — R2689 Other abnormalities of gait and mobility: Secondary | ICD-10-CM

## 2023-08-01 NOTE — Therapy (Signed)
 OUTPATIENT PHYSICAL THERAPY NEURO TREATMENT   Patient Name: Lisa Williams MRN: [id number] DOB:04-Jan-1961, 63 y.o., female Today's Date: 08/02/2023   PCP: Einar Crow  REFERRING PROVIDER: Einar Crow  END OF SESSION:  PT End of Session - 08/02/23 1011     Visit Number 7    Number of Visits 24    Date for PT Re-Evaluation 10/04/23    PT Start Time 1015    PT Stop Time 1059    PT Time Calculation (min) 44 min    Equipment Utilized During Treatment Gait belt    Activity Tolerance Patient tolerated treatment well;No increased pain    Behavior During Therapy [facility] for tasks assessed/performed                  Past Medical History:  Diagnosis Date   Abdominal pain, right upper quadrant    Back pain    Calculus of kidney 12/09/2013   Chronic back pain    unspecified   Chronic left shoulder pain 07/19/2015   Complication of anesthesia    Functional disorder of bladder    other   Galactorrhea 11/26/2014   Chronic    Hereditary and idiopathic neuropathy 08/19/2013   History of kidney stones    HPV test positive    Hypercholesteremia 08/19/2013   Hypertension    Incomplete bladder emptying    Microscopic hematuria    MS (multiple sclerosis) (HCC)    Muscle spasticity 05/21/2014   Nonspecific findings on examination of urine    other   Osteopenia    PONV (postoperative nausea and vomiting)    Status post laparoscopic supracervical hysterectomy 11/26/2014   Tobacco user 11/26/2014   Wrist fracture    Past Surgical History:  Procedure Laterality Date   bilateral tubal ligation  1996   BREAST CYST EXCISION Left 2002   CYST EXCISION Left 05/10/2022   Procedure: CYST REMOVAL;  Surgeon: Carolan Shiver, MD;  Location: [facility];  Service: General;  Laterality: Left;   FRACTURE SURGERY     KNEE SURGERY     right   LAPAROSCOPIC SUPRACERVICAL HYSTERECTOMY  08/05/2013   ORIF WRIST FRACTURE Left 01/17/2017   Procedure: OPEN REDUCTION  INTERNAL FIXATION (ORIF) WRIST FRACTURE;  Surgeon: Lyndle Herrlich, MD;  Location: [facility];  Service: Orthopedics;  Laterality: Left;   RADIOLOGY WITH ANESTHESIA N/A 03/18/2020   Procedure: MRI WITH ANESTHESIA CERVICAL SPINE AND BRAIN  WITH AND WITHOUT CONTRAST;  Surgeon: Radiologist, Medication, MD;  Location: [facility] OR;  Service: Radiology;  Laterality: N/A;   TUBAL LIGATION Bilateral    VAGINAL HYSTERECTOMY  03/2006   Patient Active Problem List   Diagnosis Date Noted   Sinus tachycardia 07/07/2023   Hypokalemia 07/29/2022   Weakness of both lower extremities 07/29/2022   Abscess of left groin 11/15/2021   Abnormal LFTs 11/15/2021   Acute respiratory disease due to COVID-19 virus 11/21/2020   Weakness    Hypoalbuminemia due to protein-calorie malnutrition [facility])    Neurogenic bowel    Neurogenic bladder    Labile blood pressure    Neuropathic pain    Abscess of female pelvis    SVT (supraventricular tachycardia) (HCC)    Radial styloid tenosynovitis 03/12/2018   Wheelchair dependence 02/27/2018   Localized osteoporosis with current pathological fracture with routine healing 01/19/2017   Wrist fracture 01/16/2017   Sprain of ankle 03/23/2016   Closed fracture of lateral malleolus 03/16/2016   Health care maintenance 01/24/2016   Blood pressure elevated without history  of HTN 10/25/2015   Essential hypertension 10/25/2015   Multiple sclerosis (HCC) 10/02/2015   Chronic left shoulder pain 07/19/2015   Multiple sclerosis exacerbation (HCC) 07/14/2015   MS (multiple sclerosis) (HCC) 11/26/2014   Increased body mass index 11/26/2014   HPV test positive 11/26/2014   Status post laparoscopic supracervical hysterectomy 11/26/2014   Galactorrhea 11/26/2014   Back ache 05/21/2014   Adiposity 05/21/2014   Disordered sleep 05/21/2014   Muscle spasticity 05/21/2014   Spasticity 05/21/2014   Calculus of kidney 12/09/2013   Renal colic 12/09/2013   Hypercholesteremia 08/19/2013    Hereditary and idiopathic neuropathy 08/19/2013   Hypercholesterolemia without hypertriglyceridemia 08/19/2013   Bladder infection, chronic 07/25/2012   Disorder of bladder function 07/25/2012   Incomplete bladder emptying 07/25/2012   Microscopic hematuria 07/25/2012   Right upper quadrant pain 07/25/2012    ONSET DATE: 1995  REFERRING DIAG: MS  THERAPY DIAG:  Muscle weakness (generalized)  Difficulty in walking, not elsewhere classified  Unsteadiness on feet  Other abnormalities of gait and mobility  Rationale for Evaluation and Treatment: Rehabilitation  SUBJECTIVE:                                                                                                                                                                                             SUBJECTIVE STATEMENT: Patient reports feeling well. Pt accompanied by: self  PERTINENT HISTORY:  Patient is returning to PT s/p hospitalization.  s/p ORIF of R tibia shaft fracture 09/23/2022. Patient has weakness in BLE with RLE>LLE. She drives with hand controls. Patient has been diagnosed with MS in 1995. PMH includes: back pain, CBP, chronic L shoulder pain, galactorrhea, neuropathy, HPV, hypercholestermeia, HTN, MS, osteopenia, PONV, wrist fracture. Additional order for other closed fracture of proximal end of R tibia with routine healing. Still has to use [employer].   PAIN:  Are you having pain?  Occasional pain in RLE; primarily in knee  PRECAUTIONS: Fall  RED FLAGS: None   WEIGHT BEARING RESTRICTIONS: No  FALLS: Has patient fallen in last 6 months? No  LIVING ENVIRONMENT: Lives with: lives with their family Lives in: House/apartment Stairs:  ramp Has following equipment at home: Dan Humphreys - 2 wheeled, Wheelchair (manual), shower chair, and Grab bars  PLOF: Independent with household mobility with device  PATIENT GOALS: to get her independence back. To be able to get into car, toilet, and get dressed  independently  OBJECTIVE:  Note: Objective measures were completed at Evaluation unless otherwise noted.  DIAGNOSTIC FINDINGS: MRI of the brain 03/18/2020 showed multiple T2/FLAIR hyperintense foci in the periventricular, juxtacortical and deep white matter.  There  were no infratentorial lesions noted.  None of the foci enhanced.   Due to severe claustrophobia, the study was done with conscious sedation in the hospital   COGNITION: Overall cognitive status: Within functional limits for tasks assessed   SENSATION: Lack of sensation in feet, loss in bilateral lateral aspect of knee  COORDINATION: Does not have the strength for functional LE heel slide test     MUSCLE TONE: BLE mild tone    POSTURE: rounded shoulders, forward head, anterior pelvic tilt, and weight shift left    LOWER EXTREMITY MMT:    MMT Right Eval Left Eval  Hip flexion 0.9 1.5  Hip extension    Hip abduction 1.8 2.6  Hip adduction 3.1 1.9  Hip internal rotation    Hip external rotation    Knee flexion 0.9 1.5  Knee extension 0.8 1.2  Ankle dorsiflexion    Ankle plantarflexion    Ankle inversion    Ankle eversion    (Blank rows = not tested)  BED MOBILITY:  Assess in future session due to limited time  TRANSFERS: Assistive device utilized:  Bariatric RW with sit to stands, slide board to table   Sit to stand:  unable to reach full stand Stand to sit:  unable to reach full stand  Chair to chair:  slide board with CGA     GAIT: Unable to ambulate at this time.   FUNCTIONAL TESTS:  Sit to stand: tricep press with BUE; unable to bring arm to walker.    Function In Sitting Test (FIST)  (1/2 femur on surface; hips/knees flexed to 90deg)   - indicate bed or mat table / step stool if used  SCORING KEY: 4 = Independent (completes task independently & successfully) 3 = Verbal Cues/Increased Time (completes task independently & successfully and only needs more time/cues) 2 = Upper Extremity  Support (must use UE for support or assistance to complete successfully) 1 = Needs Assistance (unable to complete w/o physical assist; DOCUMENT LEVEL: min, mod, max) 0 = Dependent (requires complete physical assist; unable to complete successfully even w/ physical assist)  Randomly Administer Once Throughout Exam  4 - Anterior Nudge (superior sternum)  4 - Posterior Nudge (between scapular spines)  4 - Lateral Nudge (to dominant side at acromion)     4 - Static sitting (30 seconds)  4 - Sitting, shake 'no' (left and right)  4 - Sitting, eyes closed (30 seconds)   0 - Sitting, lift foot (dominant side, lift foot 1 inch twice)    2 - Pick up object from behind (object at midline, hands breadth posterior)  3 - Forward reach (use dominant arm, must complete full motion) 2 - Lateral reach (use dominant arm, clear opposite ischial tuberosity) 2 - Pick up object from floor (from between feet)   2 - Posterior scooting (move backwards 2 inches)  2 - Anterior scooting (move forward 2 inches)  2 - Lateral scooting (move to dominant side 2 inches)    TOTAL = 39/56  Notes/comments: slide board tranfer to/from table   MCD > 5 points MCID for IP REHAB > 6 points  TREATMENT DATE: 08/02/23 TherAct:  All activities performed with gait belt and CGA unless otherwise noted.   In // bars: Sit to stand; ambulate length of // bars with assistance wheelchair follow and cues for R weight shift to clear L foot due to limited acceptance of weight on RLE Second stand: alternate tap to PT shoulders 10x each side     TherEx BTB row 15x BTB d2 pattern 10x each side BTB ER 10x BTB overhead raise 10x        Pt requires significant therapeutic rest breaks throughout the session due to fatigue.    PATIENT EDUCATION: Education details: goals, POC, HEP  Person educated:  Patient Education method: Explanation, Demonstration, Tactile cues, Verbal cues, and Handouts Education comprehension: verbalized understanding, returned demonstration, verbal cues required, tactile cues required, and needs further education  HOME EXERCISE PROGRAM: Stand 3x/day in stander   GOALS: Goals reviewed with patient? Yes  SHORT TERM GOALS: Target date: 08/08/2023    Patient will be independent in home exercise program to improve strength/mobility for better functional independence with ADLs.  Baseline: Goal status: INITIAL   Goal status: INITIAL  LONG TERM GOALS: Target date: 10/04/2023    Patient will tolerate five consecutive stands with UE support from wheelchair to standing to improve functional mobility.  Baseline: unable to perform  Goal status: INITIAL  2.  Patient will ambulate 10 ft with bRW with wheelchair follow.  Baseline:  unable to ambulate Goal status: INITIAL  3.  Patient will improve FIST score >6 points to demonstrate improved stability and ability to perform ADLs.  Baseline:  3/6: 39/56 Goal status: INITIAL  4.  Patient will perform toileting with mod I at home for improved independence.  Baseline: 3/6: requires assistance Goal status: INITIAL    ASSESSMENT:  CLINICAL IMPRESSION: Patient is able to ambulate full length of bar today without assistance for LLE foot clearance. She has increased control of weight shift onto RLE.  Patient continues to present with excellent motivation to improve independent mobility.   Patient will benefit from skilled physical therapy to improve mobility, stability, and independence.   OBJECTIVE IMPAIRMENTS: Abnormal gait, cardiopulmonary status limiting activity, decreased activity tolerance, decreased balance, decreased coordination, decreased endurance, decreased mobility, difficulty walking, decreased ROM, decreased strength, hypomobility, increased fascial restrictions, impaired perceived functional ability,  impaired flexibility, impaired sensation, improper body mechanics, and postural dysfunction.   ACTIVITY LIMITATIONS: carrying, lifting, bending, sitting, standing, squatting, sleeping, stairs, transfers, bed mobility, continence, bathing, toileting, dressing, self feeding, reach over head, hygiene/grooming, locomotion level, and caring for others  PARTICIPATION LIMITATIONS: meal prep, cleaning, laundry, interpersonal relationship, driving, shopping, community activity, and church  PERSONAL FACTORS: Age, Past/current experiences, Time since onset of injury/illness/exacerbation, Transportation, and 3+ comorbidities: ack pain, CBP, chronic L shoulder pain, galactorrhea, neuropathy, HPV, hypercholestermeia, HTN, MS, osteopenia, PONV, wrist fracture  are also affecting patient's functional outcome.   REHAB POTENTIAL: Good  CLINICAL DECISION MAKING: Evolving/moderate complexity  EVALUATION COMPLEXITY: Moderate  PLAN:  PT FREQUENCY: 2x/week  PT DURATION: 12 weeks  PLANNED INTERVENTIONS: 97164- PT Re-evaluation, 97110-Therapeutic exercises, 97530- Therapeutic activity, 97112- Neuromuscular re-education, 97535- Self Care, 78469- Manual therapy, [phone]- Gait training, [phone]- Orthotic Fit/training, [phone]- Canalith repositioning, [id number]- Aquatic Therapy, [phone]- Electrical stimulation (unattended), [phone]- Electrical stimulation (manual), [id number]- Ultrasound, [id number]- Traction (mechanical), Patient/Family education, Balance training, Stair training, Taping, Dry Needling, Joint mobilization, Joint manipulation, Spinal manipulation, Spinal mobilization, Scar mobilization, Compression bandaging, Vestibular training, Visual/preceptual remediation/compensation, Cognitive remediation, DME instructions, Cryotherapy, Moist heat, and Biofeedback  PLAN  FOR NEXT SESSION: sit to stands; try supported walking    Precious Bard, PT 08/02/2023, 11:01 AM

## 2023-08-02 ENCOUNTER — Ambulatory Visit: Payer: PPO

## 2023-08-02 DIAGNOSIS — R2689 Other abnormalities of gait and mobility: Secondary | ICD-10-CM

## 2023-08-02 DIAGNOSIS — R2681 Unsteadiness on feet: Secondary | ICD-10-CM

## 2023-08-02 DIAGNOSIS — M6281 Muscle weakness (generalized): Secondary | ICD-10-CM | POA: Diagnosis not present

## 2023-08-02 DIAGNOSIS — R262 Difficulty in walking, not elsewhere classified: Secondary | ICD-10-CM

## 2023-08-07 ENCOUNTER — Ambulatory Visit: Payer: PPO | Attending: Neurology

## 2023-08-07 DIAGNOSIS — R278 Other lack of coordination: Secondary | ICD-10-CM | POA: Insufficient documentation

## 2023-08-07 DIAGNOSIS — R2681 Unsteadiness on feet: Secondary | ICD-10-CM

## 2023-08-07 DIAGNOSIS — R262 Difficulty in walking, not elsewhere classified: Secondary | ICD-10-CM | POA: Diagnosis present

## 2023-08-07 DIAGNOSIS — M6281 Muscle weakness (generalized): Secondary | ICD-10-CM | POA: Diagnosis present

## 2023-08-07 DIAGNOSIS — R2689 Other abnormalities of gait and mobility: Secondary | ICD-10-CM | POA: Insufficient documentation

## 2023-08-07 DIAGNOSIS — G35 Multiple sclerosis: Secondary | ICD-10-CM | POA: Insufficient documentation

## 2023-08-07 NOTE — Therapy (Signed)
 OUTPATIENT PHYSICAL THERAPY NEURO TREATMENT   Patient Name: Lisa Williams MRN: [id number] DOB:20-Nov-1960, 63 y.o., female Today's Date: 08/07/2023   PCP: Einar Crow  REFERRING PROVIDER: Einar Crow  END OF SESSION:  PT End of Session - 08/07/23 1014     Visit Number 8    Number of Visits 24    Date for PT Re-Evaluation 10/04/23    PT Start Time 1015    PT Stop Time 1059    PT Time Calculation (min) 44 min    Equipment Utilized During Treatment Gait belt    Activity Tolerance Patient tolerated treatment well;No increased pain    Behavior During Therapy [facility] for tasks assessed/performed                   Past Medical History:  Diagnosis Date   Abdominal pain, right upper quadrant    Back pain    Calculus of kidney 12/09/2013   Chronic back pain    unspecified   Chronic left shoulder pain 07/19/2015   Complication of anesthesia    Functional disorder of bladder    other   Galactorrhea 11/26/2014   Chronic    Hereditary and idiopathic neuropathy 08/19/2013   History of kidney stones    HPV test positive    Hypercholesteremia 08/19/2013   Hypertension    Incomplete bladder emptying    Microscopic hematuria    MS (multiple sclerosis) (HCC)    Muscle spasticity 05/21/2014   Nonspecific findings on examination of urine    other   Osteopenia    PONV (postoperative nausea and vomiting)    Status post laparoscopic supracervical hysterectomy 11/26/2014   Tobacco user 11/26/2014   Wrist fracture    Past Surgical History:  Procedure Laterality Date   bilateral tubal ligation  1996   BREAST CYST EXCISION Left 2002   CYST EXCISION Left 05/10/2022   Procedure: CYST REMOVAL;  Surgeon: Carolan Shiver, MD;  Location: [facility] ORS;  Service: General;  Laterality: Left;   FRACTURE SURGERY     KNEE SURGERY     right   LAPAROSCOPIC SUPRACERVICAL HYSTERECTOMY  08/05/2013   ORIF WRIST FRACTURE Left 01/17/2017   Procedure: OPEN REDUCTION  INTERNAL FIXATION (ORIF) WRIST FRACTURE;  Surgeon: Lyndle Herrlich, MD;  Location: [facility] OR[facility];  Service: Orthopedics;  Laterality: Left;   RADIOLOGY WITH ANESTHESIA N/A 03/18/2020   Procedure: MRI WITH ANESTHESIA CERVICAL SPINE AND BRAIN  WITH AND WITHOUT CONTRAST;  Surgeon: Radiologist, Medication, MD;  Location: [facility] OR;  Service: Radiology;  Laterality: N/A;   TUBAL LIGATION Bilateral    VAGINAL HYSTERECTOMY  03/2006   Patient Active Problem List   Diagnosis Date Noted   Sinus tachycardia 07/07/2023   Hypokalemia 07/29/2022   Weakness of both lower extremities 07/29/2022   Abscess of left groin 11/15/2021   Abnormal LFTs 11/15/2021   Acute respiratory disease due to COVID-19 virus 11/21/2020   Weakness    Hypoalbuminemia due to protein-calorie malnutrition [facility])    Neurogenic bowel    Neurogenic bladder    Labile blood pressure    Neuropathic pain    Abscess of female pelvis    SVT (supraventricular tachycardia) (HCC)    Radial styloid tenosynovitis 03/12/2018   Wheelchair dependence 02/27/2018   Localized osteoporosis with current pathological fracture with routine healing 01/19/2017   Wrist fracture 01/16/2017   Sprain of ankle 03/23/2016   Closed fracture of lateral malleolus 03/16/2016   Health care maintenance 01/24/2016   Blood pressure elevated without  history of HTN 10/25/2015   Essential hypertension 10/25/2015   Multiple sclerosis (HCC) 10/02/2015   Chronic left shoulder pain 07/19/2015   Multiple sclerosis exacerbation (HCC) 07/14/2015   MS (multiple sclerosis) (HCC) 11/26/2014   Increased body mass index 11/26/2014   HPV test positive 11/26/2014   Status post laparoscopic supracervical hysterectomy 11/26/2014   Galactorrhea 11/26/2014   Back ache 05/21/2014   Adiposity 05/21/2014   Disordered sleep 05/21/2014   Muscle spasticity 05/21/2014   Spasticity 05/21/2014   Calculus of kidney 12/09/2013   Renal colic 12/09/2013   Hypercholesteremia 08/19/2013    Hereditary and idiopathic neuropathy 08/19/2013   Hypercholesterolemia without hypertriglyceridemia 08/19/2013   Bladder infection, chronic 07/25/2012   Disorder of bladder function 07/25/2012   Incomplete bladder emptying 07/25/2012   Microscopic hematuria 07/25/2012   Right upper quadrant pain 07/25/2012    ONSET DATE: 1995  REFERRING DIAG: MS  THERAPY DIAG:  Muscle weakness (generalized)  Difficulty in walking, not elsewhere classified  Unsteadiness on feet  Rationale for Evaluation and Treatment: Rehabilitation  SUBJECTIVE:                                                                                                                                                                                             SUBJECTIVE STATEMENT: Patient is motivated for today's attempt in LiteGait Pt accompanied by: self  PERTINENT HISTORY:  Patient is returning to PT s/p hospitalization.  s/p ORIF of R tibia shaft fracture 09/23/2022. Patient has weakness in BLE with RLE>LLE. She drives with hand controls. Patient has been diagnosed with MS in 1995. PMH includes: back pain, CBP, chronic L shoulder pain, galactorrhea, neuropathy, HPV, hypercholestermeia, HTN, MS, osteopenia, PONV, wrist fracture. Additional order for other closed fracture of proximal end of R tibia with routine healing. Still has to use [employer].   PAIN:  Are you having pain?  Occasional pain in RLE; primarily in knee  PRECAUTIONS: Fall  RED FLAGS: None   WEIGHT BEARING RESTRICTIONS: No  FALLS: Has patient fallen in last 6 months? No  LIVING ENVIRONMENT: Lives with: lives with their family Lives in: House/apartment Stairs:  ramp Has following equipment at home: Dan Humphreys - 2 wheeled, Wheelchair (manual), shower chair, and Grab bars  PLOF: Independent with household mobility with device  PATIENT GOALS: to get her independence back. To be able to get into car, toilet, and get dressed independently  OBJECTIVE:   Note: Objective measures were completed at Evaluation unless otherwise noted.  DIAGNOSTIC FINDINGS: MRI of the brain 03/18/2020 showed multiple T2/FLAIR hyperintense foci in the periventricular, juxtacortical and deep white matter.  There were no  infratentorial lesions noted.  None of the foci enhanced.   Due to severe claustrophobia, the study was done with conscious sedation in the hospital   COGNITION: Overall cognitive status: Within functional limits for tasks assessed   SENSATION: Lack of sensation in feet, loss in bilateral lateral aspect of knee  COORDINATION: Does not have the strength for functional LE heel slide test     MUSCLE TONE: BLE mild tone    POSTURE: rounded shoulders, forward head, anterior pelvic tilt, and weight shift left    LOWER EXTREMITY MMT:    MMT Right Eval Left Eval  Hip flexion 0.9 1.5  Hip extension    Hip abduction 1.8 2.6  Hip adduction 3.1 1.9  Hip internal rotation    Hip external rotation    Knee flexion 0.9 1.5  Knee extension 0.8 1.2  Ankle dorsiflexion    Ankle plantarflexion    Ankle inversion    Ankle eversion    (Blank rows = not tested)  BED MOBILITY:  Assess in future session due to limited time  TRANSFERS: Assistive device utilized:  Bariatric RW with sit to stands, slide board to table   Sit to stand:  unable to reach full stand Stand to sit:  unable to reach full stand  Chair to chair:  slide board with CGA     GAIT: Unable to ambulate at this time.   FUNCTIONAL TESTS:  Sit to stand: tricep press with BUE; unable to bring arm to walker.    Function In Sitting Test (FIST)  (1/2 femur on surface; hips/knees flexed to 90deg)   - indicate bed or mat table / step stool if used  SCORING KEY: 4 = Independent (completes task independently & successfully) 3 = Verbal Cues/Increased Time (completes task independently & successfully and only needs more time/cues) 2 = Upper Extremity Support (must use UE for  support or assistance to complete successfully) 1 = Needs Assistance (unable to complete w/o physical assist; DOCUMENT LEVEL: min, mod, max) 0 = Dependent (requires complete physical assist; unable to complete successfully even w/ physical assist)  Randomly Administer Once Throughout Exam  4 - Anterior Nudge (superior sternum)  4 - Posterior Nudge (between scapular spines)  4 - Lateral Nudge (to dominant side at acromion)     4 - Static sitting (30 seconds)  4 - Sitting, shake 'no' (left and right)  4 - Sitting, eyes closed (30 seconds)   0 - Sitting, lift foot (dominant side, lift foot 1 inch twice)    2 - Pick up object from behind (object at midline, hands breadth posterior)  3 - Forward reach (use dominant arm, must complete full motion) 2 - Lateral reach (use dominant arm, clear opposite ischial tuberosity) 2 - Pick up object from floor (from between feet)   2 - Posterior scooting (move backwards 2 inches)  2 - Anterior scooting (move forward 2 inches)  2 - Lateral scooting (move to dominant side 2 inches)    TOTAL = 39/56  Notes/comments: slide board tranfer to/from table   MCD > 5 points MCID for IP REHAB > 6 points  TREATMENT DATE: 08/07/23 TherAct:  All activities performed with gait belt and CGA unless otherwise noted.  Lite Gait: -stood in // bars x2 attempts to don harness; x 2 person assist;  -board transfer to table: x2 trials to/from wheelchair -attached harness to Lite Gait machine on elevated plinth table -sit to stand x 6 trials  First trial : stand and weight shift for forward hip shift Second trial: push hip back and activate quads 10x;  Third trial: attempt to step back with each LE; very challenging Fourth trial: repeat of second trial Fifth trial: Stand and push through LE's     Pt requires significant therapeutic rest  breaks throughout the session due to fatigue.    PATIENT EDUCATION: Education details: goals, POC, HEP  Person educated: Patient Education method: Explanation, Demonstration, Tactile cues, Verbal cues, and Handouts Education comprehension: verbalized understanding, returned demonstration, verbal cues required, tactile cues required, and needs further education  HOME EXERCISE PROGRAM: Stand 3x/day in stander   GOALS: Goals reviewed with patient? Yes  SHORT TERM GOALS: Target date: 08/08/2023    Patient will be independent in home exercise program to improve strength/mobility for better functional independence with ADLs.  Baseline: Goal status: INITIAL   Goal status: INITIAL  LONG TERM GOALS: Target date: 10/04/2023    Patient will tolerate five consecutive stands with UE support from wheelchair to standing to improve functional mobility.  Baseline: unable to perform  Goal status: INITIAL  2.  Patient will ambulate 10 ft with bRW with wheelchair follow.  Baseline:  unable to ambulate Goal status: INITIAL  3.  Patient will improve FIST score >6 points to demonstrate improved stability and ability to perform ADLs.  Baseline:  3/6: 39/56 Goal status: INITIAL  4.  Patient will perform toileting with mod I at home for improved independence.  Baseline: 3/6: requires assistance Goal status: INITIAL    ASSESSMENT:  CLINICAL IMPRESSION: Patient tolerates progressive LE strengthening in Lite Gait for weight acceptance. Patient is highly motivated throughout session and eager to progress her weight acceptance. She has increased quad control with repetition.  Patient will benefit from skilled physical therapy to improve mobility, stability, and independence.   OBJECTIVE IMPAIRMENTS: Abnormal gait, cardiopulmonary status limiting activity, decreased activity tolerance, decreased balance, decreased coordination, decreased endurance, decreased mobility, difficulty walking, decreased  ROM, decreased strength, hypomobility, increased fascial restrictions, impaired perceived functional ability, impaired flexibility, impaired sensation, improper body mechanics, and postural dysfunction.   ACTIVITY LIMITATIONS: carrying, lifting, bending, sitting, standing, squatting, sleeping, stairs, transfers, bed mobility, continence, bathing, toileting, dressing, self feeding, reach over head, hygiene/grooming, locomotion level, and caring for others  PARTICIPATION LIMITATIONS: meal prep, cleaning, laundry, interpersonal relationship, driving, shopping, community activity, and church  PERSONAL FACTORS: Age, Past/current experiences, Time since onset of injury/illness/exacerbation, Transportation, and 3+ comorbidities: ack pain, CBP, chronic L shoulder pain, galactorrhea, neuropathy, HPV, hypercholestermeia, HTN, MS, osteopenia, PONV, wrist fracture  are also affecting patient's functional outcome.   REHAB POTENTIAL: Good  CLINICAL DECISION MAKING: Evolving/moderate complexity  EVALUATION COMPLEXITY: Moderate  PLAN:  PT FREQUENCY: 2x/week  PT DURATION: 12 weeks  PLANNED INTERVENTIONS: 97164- PT Re-evaluation, 97110-Therapeutic exercises, 97530- Therapeutic activity, 97112- Neuromuscular re-education, 97535- Self Care, 19147- Manual therapy, [phone]- Gait training, [phone]- Orthotic Fit/training, [phone]- Canalith repositioning, [id number]- Aquatic Therapy, [phone]- Electrical stimulation (unattended), [phone]- Electrical stimulation (manual), [id number]- Ultrasound, 32440- Traction (mechanical), Patient/Family education, Balance training, Stair training, Taping, Dry Needling, Joint mobilization, Joint manipulation, Spinal manipulation, Spinal mobilization, Scar mobilization, Compression bandaging, Vestibular training, Visual/preceptual remediation/compensation,  Cognitive remediation, DME instructions, Cryotherapy, Moist heat, and Biofeedback  PLAN FOR NEXT SESSION: sit to stands; try supported walking     Precious Bard, PT 08/07/2023, 12:45 PM

## 2023-08-08 NOTE — Therapy (Signed)
 OUTPATIENT PHYSICAL THERAPY NEURO TREATMENT   Patient Name: Lisa Williams MRN: [id number] DOB:[birth year], 63 y.o., female Today's Date: 08/09/2023   PCP: Einar Crow  REFERRING PROVIDER: Einar Crow  END OF SESSION:  PT End of Session - 08/09/23 1016     Visit Number 9    Number of Visits 24    Date for PT Re-Evaluation 10/04/23    PT Start Time 1015    PT Stop Time 1059    PT Time Calculation (min) 44 min    Equipment Utilized During Treatment Gait belt    Activity Tolerance Patient tolerated treatment well;No increased pain    Behavior During Therapy [facility] for tasks assessed/performed                   Past Medical History:  Diagnosis Date   Abdominal pain, right upper quadrant    Back pain    Calculus of kidney 12/09/2013   Chronic back pain    unspecified   Chronic left shoulder pain 07/19/2015   Complication of anesthesia    Functional disorder of bladder    other   Galactorrhea 11/26/2014   Chronic    Hereditary and idiopathic neuropathy 08/19/2013   History of kidney stones    HPV test positive    Hypercholesteremia 08/19/2013   Hypertension    Incomplete bladder emptying    Microscopic hematuria    MS (multiple sclerosis) (HCC)    Muscle spasticity 05/21/2014   Nonspecific findings on examination of urine    other   Osteopenia    PONV (postoperative nausea and vomiting)    Status post laparoscopic supracervical hysterectomy 11/26/2014   Tobacco user 11/26/2014   Wrist fracture    Past Surgical History:  Procedure Laterality Date   bilateral tubal ligation  1996   BREAST CYST EXCISION Left 2002   CYST EXCISION Left 05/10/2022   Procedure: CYST REMOVAL;  Surgeon: Carolan Shiver, MD;  Location: [facility] ORS;  Service: General;  Laterality: Left;   FRACTURE SURGERY     KNEE SURGERY     right   LAPAROSCOPIC SUPRACERVICAL HYSTERECTOMY  08/05/2013   ORIF WRIST FRACTURE Left 01/17/2017   Procedure: OPEN REDUCTION  INTERNAL FIXATION (ORIF) WRIST FRACTURE;  Surgeon: Lyndle Herrlich, MD;  Location: [facility] ORS;  Service: Orthopedics;  Laterality: Left;   RADIOLOGY WITH ANESTHESIA N/A 03/18/2020   Procedure: MRI WITH ANESTHESIA CERVICAL SPINE AND BRAIN  WITH AND WITHOUT CONTRAST;  Surgeon: Radiologist, Medication, MD;  Location: [facility] OR;  Service: Radiology;  Laterality: N/A;   TUBAL LIGATION Bilateral    VAGINAL HYSTERECTOMY  03/2006   Patient Active Problem List   Diagnosis Date Noted   Sinus tachycardia 07/07/2023   Hypokalemia 07/29/2022   Weakness of both lower extremities 07/29/2022   Abscess of left groin 11/15/2021   Abnormal LFTs 11/15/2021   Acute respiratory disease due to COVID-19 virus 11/21/2020   Weakness    Hypoalbuminemia due to protein-calorie malnutrition [facility])    Neurogenic bowel    Neurogenic bladder    Labile blood pressure    Neuropathic pain    Abscess of female pelvis    SVT (supraventricular tachycardia) (HCC)    Radial styloid tenosynovitis 03/12/2018   Wheelchair dependence 02/27/2018   Localized osteoporosis with current pathological fracture with routine healing 01/19/2017   Wrist fracture 01/16/2017   Sprain of ankle 03/23/2016   Closed fracture of lateral malleolus 03/16/2016   Health care maintenance 01/24/2016   Blood pressure elevated without  history of HTN 10/25/2015   Essential hypertension 10/25/2015   Multiple sclerosis (HCC) 10/02/2015   Chronic left shoulder pain 07/19/2015   Multiple sclerosis exacerbation (HCC) 07/14/2015   MS (multiple sclerosis) (HCC) 11/26/2014   Increased body mass index 11/26/2014   HPV test positive 11/26/2014   Status post laparoscopic supracervical hysterectomy 11/26/2014   Galactorrhea 11/26/2014   Back ache 05/21/2014   Adiposity 05/21/2014   Disordered sleep 05/21/2014   Muscle spasticity 05/21/2014   Spasticity 05/21/2014   Calculus of kidney 12/09/2013   Renal colic 12/09/2013   Hypercholesteremia 08/19/2013    Hereditary and idiopathic neuropathy 08/19/2013   Hypercholesterolemia without hypertriglyceridemia 08/19/2013   Bladder infection, chronic 07/25/2012   Disorder of bladder function 07/25/2012   Incomplete bladder emptying 07/25/2012   Microscopic hematuria 07/25/2012   Right upper quadrant pain 07/25/2012    ONSET DATE: 1995  REFERRING DIAG: MS  THERAPY DIAG:  Muscle weakness (generalized)  Difficulty in walking, not elsewhere classified  Unsteadiness on feet  Rationale for Evaluation and Treatment: Rehabilitation  SUBJECTIVE:                                                                                                                                                                                             SUBJECTIVE STATEMENT: Patient reports feeling good today.  Pt accompanied by: self  PERTINENT HISTORY:  Patient is returning to PT s/p hospitalization.  s/p ORIF of R tibia shaft fracture 09/23/2022. Patient has weakness in BLE with RLE>LLE. She drives with hand controls. Patient has been diagnosed with MS in 1995. PMH includes: back pain, CBP, chronic L shoulder pain, galactorrhea, neuropathy, HPV, hypercholestermeia, HTN, MS, osteopenia, PONV, wrist fracture. Additional order for other closed fracture of proximal end of R tibia with routine healing. Still has to use [employer].   PAIN:  Are you having pain?  Occasional pain in RLE; primarily in knee  PRECAUTIONS: Fall  RED FLAGS: None   WEIGHT BEARING RESTRICTIONS: No  FALLS: Has patient fallen in last 6 months? No  LIVING ENVIRONMENT: Lives with: lives with their family Lives in: House/apartment Stairs:  ramp Has following equipment at home: Dan Humphreys - 2 wheeled, Wheelchair (manual), shower chair, and Grab bars  PLOF: Independent with household mobility with device  PATIENT GOALS: to get her independence back. To be able to get into car, toilet, and get dressed independently  OBJECTIVE:  Note: Objective  measures were completed at Evaluation unless otherwise noted.  DIAGNOSTIC FINDINGS: MRI of the brain 03/18/2020 showed multiple T2/FLAIR hyperintense foci in the periventricular, juxtacortical and deep white matter.  There were no infratentorial lesions  noted.  None of the foci enhanced.   Due to severe claustrophobia, the study was done with conscious sedation in the hospital   COGNITION: Overall cognitive status: Within functional limits for tasks assessed   SENSATION: Lack of sensation in feet, loss in bilateral lateral aspect of knee  COORDINATION: Does not have the strength for functional LE heel slide test     MUSCLE TONE: BLE mild tone    POSTURE: rounded shoulders, forward head, anterior pelvic tilt, and weight shift left    LOWER EXTREMITY MMT:    MMT Right Eval Left Eval  Hip flexion 0.9 1.5  Hip extension    Hip abduction 1.8 2.6  Hip adduction 3.1 1.9  Hip internal rotation    Hip external rotation    Knee flexion 0.9 1.5  Knee extension 0.8 1.2  Ankle dorsiflexion    Ankle plantarflexion    Ankle inversion    Ankle eversion    (Blank rows = not tested)  BED MOBILITY:  Assess in future session due to limited time  TRANSFERS: Assistive device utilized:  Bariatric RW with sit to stands, slide board to table   Sit to stand:  unable to reach full stand Stand to sit:  unable to reach full stand  Chair to chair:  slide board with CGA     GAIT: Unable to ambulate at this time.   FUNCTIONAL TESTS:  Sit to stand: tricep press with BUE; unable to bring arm to walker.    Function In Sitting Test (FIST)  (1/2 femur on surface; hips/knees flexed to 90deg)   - indicate bed or mat table / step stool if used  SCORING KEY: 4 = Independent (completes task independently & successfully) 3 = Verbal Cues/Increased Time (completes task independently & successfully and only needs more time/cues) 2 = Upper Extremity Support (must use UE for support or assistance  to complete successfully) 1 = Needs Assistance (unable to complete w/o physical assist; DOCUMENT LEVEL: min, mod, max) 0 = Dependent (requires complete physical assist; unable to complete successfully even w/ physical assist)  Randomly Administer Once Throughout Exam  4 - Anterior Nudge (superior sternum)  4 - Posterior Nudge (between scapular spines)  4 - Lateral Nudge (to dominant side at acromion)     4 - Static sitting (30 seconds)  4 - Sitting, shake 'no' (left and right)  4 - Sitting, eyes closed (30 seconds)   0 - Sitting, lift foot (dominant side, lift foot 1 inch twice)    2 - Pick up object from behind (object at midline, hands breadth posterior)  3 - Forward reach (use dominant arm, must complete full motion) 2 - Lateral reach (use dominant arm, clear opposite ischial tuberosity) 2 - Pick up object from floor (from between feet)   2 - Posterior scooting (move backwards 2 inches)  2 - Anterior scooting (move forward 2 inches)  2 - Lateral scooting (move to dominant side 2 inches)    TOTAL = 39/56  Notes/comments: slide board tranfer to/from table   MCD > 5 points MCID for IP REHAB > 6 points  TREATMENT DATE: 08/09/23 TherAct:  All activities performed with gait belt and CGA unless otherwise noted.   In // bars: Sit to stand; ambulate length of // bars with assistance wheelchair follow and cues for R weight shift to clear L foot due to limited acceptance of weight on RLE; one seated rest break  Mini stands 5x with focus on LE press      TherEx 10lb bar:  -overhead press 10x -chest press 10x -bicep curl 10x  -reverse curl 10x -witches brew 10x clockwise 10x counterclockwise      Pt requires significant therapeutic rest breaks throughout the session due to fatigue.    PATIENT EDUCATION: Education details: goals, POC, HEP  Person  educated: Patient Education method: Explanation, Demonstration, Tactile cues, Verbal cues, and Handouts Education comprehension: verbalized understanding, returned demonstration, verbal cues required, tactile cues required, and needs further education  HOME EXERCISE PROGRAM: Stand 3x/day in stander   GOALS: Goals reviewed with patient? Yes  SHORT TERM GOALS: Target date: 08/08/2023    Patient will be independent in home exercise program to improve strength/mobility for better functional independence with ADLs.  Baseline: Goal status: INITIAL   Goal status: INITIAL  LONG TERM GOALS: Target date: 10/04/2023    Patient will tolerate five consecutive stands with UE support from wheelchair to standing to improve functional mobility.  Baseline: unable to perform  Goal status: INITIAL  2.  Patient will ambulate 10 ft with bRW with wheelchair follow.  Baseline:  unable to ambulate Goal status: INITIAL  3.  Patient will improve FIST score >6 points to demonstrate improved stability and ability to perform ADLs.  Baseline:  3/6: 39/56 Goal status: INITIAL  4.  Patient will perform toileting with mod I at home for improved independence.  Baseline: 3/6: requires assistance Goal status: INITIAL    ASSESSMENT:  CLINICAL IMPRESSION: Patient tolerates ambulation in bar with one seated rest break. She is eager to progress her functional mobility to increase independence. She continues to be challenged with L foot progression due to need for weight shift onto RLE.  Patient will benefit from skilled physical therapy to improve mobility, stability, and independence.   OBJECTIVE IMPAIRMENTS: Abnormal gait, cardiopulmonary status limiting activity, decreased activity tolerance, decreased balance, decreased coordination, decreased endurance, decreased mobility, difficulty walking, decreased ROM, decreased strength, hypomobility, increased fascial restrictions, impaired perceived functional  ability, impaired flexibility, impaired sensation, improper body mechanics, and postural dysfunction.   ACTIVITY LIMITATIONS: carrying, lifting, bending, sitting, standing, squatting, sleeping, stairs, transfers, bed mobility, continence, bathing, toileting, dressing, self feeding, reach over head, hygiene/grooming, locomotion level, and caring for others  PARTICIPATION LIMITATIONS: meal prep, cleaning, laundry, interpersonal relationship, driving, shopping, community activity, and church  PERSONAL FACTORS: Age, Past/current experiences, Time since onset of injury/illness/exacerbation, Transportation, and 3+ comorbidities: ack pain, CBP, chronic L shoulder pain, galactorrhea, neuropathy, HPV, hypercholestermeia, HTN, MS, osteopenia, PONV, wrist fracture  are also affecting patient's functional outcome.   REHAB POTENTIAL: Good  CLINICAL DECISION MAKING: Evolving/moderate complexity  EVALUATION COMPLEXITY: Moderate  PLAN:  PT FREQUENCY: 2x/week  PT DURATION: 12 weeks  PLANNED INTERVENTIONS: 97164- PT Re-evaluation, 97110-Therapeutic exercises, 97530- Therapeutic activity, 97112- Neuromuscular re-education, 97535- Self Care, 16109- Manual therapy, [phone]- Gait training, [phone]- Orthotic Fit/training, [phone]- Canalith repositioning, [id number]- Aquatic Therapy, [phone]- Electrical stimulation (unattended), [phone]- Electrical stimulation (manual), [id number]- Ultrasound, [id number]- Traction (mechanical), Patient/Family education, Balance training, Stair training, Taping, Dry Needling, Joint mobilization, Joint manipulation, Spinal manipulation, Spinal mobilization, Scar mobilization, Compression bandaging, Vestibular training, Visual/preceptual remediation/compensation, Cognitive remediation, DME  instructions, Cryotherapy, Moist heat, and Biofeedback  PLAN FOR NEXT SESSION: sit to stands; try supported walking    Precious Bard, PT 08/09/2023, 11:02 AM

## 2023-08-09 ENCOUNTER — Ambulatory Visit: Payer: PPO

## 2023-08-09 DIAGNOSIS — M6281 Muscle weakness (generalized): Secondary | ICD-10-CM

## 2023-08-09 DIAGNOSIS — R262 Difficulty in walking, not elsewhere classified: Secondary | ICD-10-CM

## 2023-08-09 DIAGNOSIS — R2681 Unsteadiness on feet: Secondary | ICD-10-CM

## 2023-08-13 ENCOUNTER — Other Ambulatory Visit: Payer: Self-pay | Admitting: Neurology

## 2023-08-13 NOTE — Therapy (Signed)
 OUTPATIENT PHYSICAL THERAPY NEURO TREATMENT/ Physical Therapy Progress Note   Dates of reporting period  07/12/23   to   08/14/23     Patient Name: Lisa Williams MRN: [id number] DOB:08-Feb-1961, 63 y.o., female Today's Date: 08/14/2023   PCP: Einar Crow  REFERRING PROVIDER: Einar Crow  END OF SESSION:  PT End of Session - 08/14/23 1011     Visit Number 10    Number of Visits 24    Date for PT Re-Evaluation 10/04/23    PT Start Time 1014    PT Stop Time 1059    PT Time Calculation (min) 45 min    Equipment Utilized During Treatment Gait belt    Activity Tolerance Patient tolerated treatment well;No increased pain    Behavior During Therapy [facility] for tasks assessed/performed                    Past Medical History:  Diagnosis Date   Abdominal pain, right upper quadrant    Back pain    Calculus of kidney 12/09/2013   Chronic back pain    unspecified   Chronic left shoulder pain 07/19/2015   Complication of anesthesia    Functional disorder of bladder    other   Galactorrhea 11/26/2014   Chronic    Hereditary and idiopathic neuropathy 08/19/2013   History of kidney stones    HPV test positive    Hypercholesteremia 08/19/2013   Hypertension    Incomplete bladder emptying    Microscopic hematuria    MS (multiple sclerosis) (HCC)    Muscle spasticity 05/21/2014   Nonspecific findings on examination of urine    other   Osteopenia    PONV (postoperative nausea and vomiting)    Status post laparoscopic supracervical hysterectomy 11/26/2014   Tobacco user 11/26/2014   Wrist fracture    Past Surgical History:  Procedure Laterality Date   bilateral tubal ligation  1996   BREAST CYST EXCISION Left 2002   CYST EXCISION Left 05/10/2022   Procedure: CYST REMOVAL;  Surgeon: Carolan Shiver, MD;  Location: [facility] ORS;  Service: General;  Laterality: Left;   FRACTURE SURGERY     KNEE SURGERY     right   LAPAROSCOPIC SUPRACERVICAL  HYSTERECTOMY  08/05/2013   ORIF WRIST FRACTURE Left 01/17/2017   Procedure: OPEN REDUCTION INTERNAL FIXATION (ORIF) WRIST FRACTURE;  Surgeon: Lyndle Herrlich, MD;  Location: [facility] OR[facility];  Service: Orthopedics;  Laterality: Left;   RADIOLOGY WITH ANESTHESIA N/A 03/18/2020   Procedure: MRI WITH ANESTHESIA CERVICAL SPINE AND BRAIN  WITH AND WITHOUT CONTRAST;  Surgeon: Radiologist, Medication, MD;  Location: [facility] OR;  Service: Radiology;  Laterality: N/A;   TUBAL LIGATION Bilateral    VAGINAL HYSTERECTOMY  03/2006   Patient Active Problem List   Diagnosis Date Noted   Sinus tachycardia 07/07/2023   Hypokalemia 07/29/2022   Weakness of both lower extremities 07/29/2022   Abscess of left groin 11/15/2021   Abnormal LFTs 11/15/2021   Acute respiratory disease due to COVID-19 virus 11/21/2020   Weakness    Hypoalbuminemia due to protein-calorie malnutrition [facility])    Neurogenic bowel    Neurogenic bladder    Labile blood pressure    Neuropathic pain    Abscess of female pelvis    SVT (supraventricular tachycardia) (HCC)    Radial styloid tenosynovitis 03/12/2018   Wheelchair dependence 02/27/2018   Localized osteoporosis with current pathological fracture with routine healing 01/19/2017   Wrist fracture 01/16/2017   Sprain of ankle  03/23/2016   Closed fracture of lateral malleolus 03/16/2016   Health care maintenance 01/24/2016   Blood pressure elevated without history of HTN 10/25/2015   Essential hypertension 10/25/2015   Multiple sclerosis (HCC) 10/02/2015   Chronic left shoulder pain 07/19/2015   Multiple sclerosis exacerbation (HCC) 07/14/2015   MS (multiple sclerosis) (HCC) 11/26/2014   Increased body mass index 11/26/2014   HPV test positive 11/26/2014   Status post laparoscopic supracervical hysterectomy 11/26/2014   Galactorrhea 11/26/2014   Back ache 05/21/2014   Adiposity 05/21/2014   Disordered sleep 05/21/2014   Muscle spasticity 05/21/2014   Spasticity 05/21/2014    Calculus of kidney 12/09/2013   Renal colic 12/09/2013   Hypercholesteremia 08/19/2013   Hereditary and idiopathic neuropathy 08/19/2013   Hypercholesterolemia without hypertriglyceridemia 08/19/2013   Bladder infection, chronic 07/25/2012   Disorder of bladder function 07/25/2012   Incomplete bladder emptying 07/25/2012   Microscopic hematuria 07/25/2012   Right upper quadrant pain 07/25/2012    ONSET DATE: 1995  REFERRING DIAG: MS  THERAPY DIAG:  Muscle weakness (generalized)  Difficulty in walking, not elsewhere classified  Unsteadiness on feet  Rationale for Evaluation and Treatment: Rehabilitation  SUBJECTIVE:                                                                                                                                                                                             SUBJECTIVE STATEMENT: Patient reports she has had a stitch in her side.   Pt accompanied by: self  PERTINENT HISTORY:  Patient is returning to PT s/p hospitalization.  s/p ORIF of R tibia shaft fracture 09/23/2022. Patient has weakness in BLE with RLE>LLE. She drives with hand controls. Patient has been diagnosed with MS in 1995. PMH includes: back pain, CBP, chronic L shoulder pain, galactorrhea, neuropathy, HPV, hypercholestermeia, HTN, MS, osteopenia, PONV, wrist fracture. Additional order for other closed fracture of proximal end of R tibia with routine healing. Still has to use [employer].   PAIN:  Are you having pain?  Occasional pain in RLE; primarily in knee  PRECAUTIONS: Fall  RED FLAGS: None   WEIGHT BEARING RESTRICTIONS: No  FALLS: Has patient fallen in last 6 months? No  LIVING ENVIRONMENT: Lives with: lives with their family Lives in: House/apartment Stairs:  ramp Has following equipment at home: Dan Humphreys - 2 wheeled, Wheelchair (manual), shower chair, and Grab bars  PLOF: Independent with household mobility with device  PATIENT GOALS: to get her independence  back. To be able to get into car, toilet, and get dressed independently  OBJECTIVE:  Note: Objective measures were completed at Evaluation unless otherwise noted.  DIAGNOSTIC FINDINGS: MRI of the brain 03/18/2020 showed multiple T2/FLAIR hyperintense foci in the periventricular, juxtacortical and deep white matter.  There were no infratentorial lesions noted.  None of the foci enhanced.   Due to severe claustrophobia, the study was done with conscious sedation in the hospital   COGNITION: Overall cognitive status: Within functional limits for tasks assessed   SENSATION: Lack of sensation in feet, loss in bilateral lateral aspect of knee  COORDINATION: Does not have the strength for functional LE heel slide test     MUSCLE TONE: BLE mild tone    POSTURE: rounded shoulders, forward head, anterior pelvic tilt, and weight shift left    LOWER EXTREMITY MMT:    MMT Right Eval Left Eval  Hip flexion 0.9 1.5  Hip extension    Hip abduction 1.8 2.6  Hip adduction 3.1 1.9  Hip internal rotation    Hip external rotation    Knee flexion 0.9 1.5  Knee extension 0.8 1.2  Ankle dorsiflexion    Ankle plantarflexion    Ankle inversion    Ankle eversion    (Blank rows = not tested)  BED MOBILITY:  Assess in future session due to limited time  TRANSFERS: Assistive device utilized:  Bariatric RW with sit to stands, slide board to table   Sit to stand:  unable to reach full stand Stand to sit:  unable to reach full stand  Chair to chair:  slide board with CGA     GAIT: Unable to ambulate at this time.   FUNCTIONAL TESTS:  Sit to stand: tricep press with BUE; unable to bring arm to walker.    Function In Sitting Test (FIST)  (1/2 femur on surface; hips/knees flexed to 90deg)   - indicate bed or mat table / step stool if used  SCORING KEY: 4 = Independent (completes task independently & successfully) 3 = Verbal Cues/Increased Time (completes task independently &  successfully and only needs more time/cues) 2 = Upper Extremity Support (must use UE for support or assistance to complete successfully) 1 = Needs Assistance (unable to complete w/o physical assist; DOCUMENT LEVEL: min, mod, max) 0 = Dependent (requires complete physical assist; unable to complete successfully even w/ physical assist)  Randomly Administer Once Throughout Exam  4 - Anterior Nudge (superior sternum)  4 - Posterior Nudge (between scapular spines)  4 - Lateral Nudge (to dominant side at acromion)     4 - Static sitting (30 seconds)  4 - Sitting, shake 'no' (left and right)  4 - Sitting, eyes closed (30 seconds)   0 - Sitting, lift foot (dominant side, lift foot 1 inch twice)    2 - Pick up object from behind (object at midline, hands breadth posterior)  3 - Forward reach (use dominant arm, must complete full motion) 2 - Lateral reach (use dominant arm, clear opposite ischial tuberosity) 2 - Pick up object from floor (from between feet)   2 - Posterior scooting (move backwards 2 inches)  2 - Anterior scooting (move forward 2 inches)  2 - Lateral scooting (move to dominant side 2 inches)    TOTAL = 39/56  Notes/comments: slide board tranfer to/from table   MCD > 5 points MCID for IP REHAB > 6 points  TREATMENT DATE: 08/14/23 Physical therapy treatment session today consisted of completing assessment of goals and administration of testing as demonstrated and documented in flow sheet, treatment, and goals section of this note. Addition treatments may be found below.    Function In Sitting Test (FIST)  (1/2 femur on surface; hips/knees flexed to 90deg)   - indicate bed or mat table / step stool if used  SCORING KEY: 4 = Independent (completes task independently & successfully) 3 = Verbal Cues/Increased Time (completes task independently &  successfully and only needs more time/cues) 2 = Upper Extremity Support (must use UE for support or assistance to complete successfully) 1 = Needs Assistance (unable to complete w/o physical assist; DOCUMENT LEVEL: min, mod, max) 0 = Dependent (requires complete physical assist; unable to complete successfully even w/ physical assist)  Randomly Administer Once Throughout Exam  4 - Anterior Nudge (superior sternum)  4 - Posterior Nudge (between scapular spines)  4 - Lateral Nudge (to dominant side at acromion)     4 - Static sitting (30 seconds)  4 - Sitting, shake 'no' (left and right)  4 - Sitting, eyes closed (30 seconds)   0 - Sitting, lift foot (dominant side, lift foot 1 inch twice)    4 - Pick up object from behind (object at midline, hands breadth posterior)  4 - Forward reach (use dominant arm, must complete full motion) 4 - Lateral reach (use dominant arm, clear opposite ischial tuberosity) 2 - Pick up object from floor (from between feet)   3 - Posterior scooting (move backwards 2 inches)  3 - Anterior scooting (move forward 2 inches)  3 - Lateral scooting (move to dominant side 2 inches)    TOTAL = 47/56    MCD > 5 points MCID for IP REHAB > 6 points   Sit to stand:  Multiple partial attempts  Manual STM to R paraspinal and quadratus lumborum for spasm reduction x 9 minutes    Pt requires significant therapeutic rest breaks throughout the session due to fatigue.    PATIENT EDUCATION: Education details: goals, POC, HEP  Person educated: Patient Education method: Explanation, Demonstration, Tactile cues, Verbal cues, and Handouts Education comprehension: verbalized understanding, returned demonstration, verbal cues required, tactile cues required, and needs further education  HOME EXERCISE PROGRAM: Stand 3x/day in stander   GOALS: Goals reviewed with patient? Yes  SHORT TERM GOALS: Target date: 08/08/2023    Patient will be independent in home  exercise program to improve strength/mobility for better functional independence with ADLs.  Baseline: 4/8: compliance Goal status: MET    LONG TERM GOALS: Target date: 10/04/2023    Patient will tolerate five consecutive stands with UE support from wheelchair to standing to improve functional mobility.  Baseline: unable to perform 4/8:unable to perform full stand Goal status: Ongoing   2.  Patient will ambulate 10 ft with bRW with wheelchair follow.  Baseline:  unable to ambulate 4/8: unable to ambulate Goal status: partially met  3.  Patient will improve FIST score >6 points to demonstrate improved stability and ability to perform ADLs.  Baseline:  3/6: 39/56 4/8: 47/ 56  Goal status: MET  4.  Patient will perform toileting with mod I at home for improved independence.  Baseline: 3/6: requires assistance 4/8: requires dependence on machine Goal status: Partially Met    ASSESSMENT:  CLINICAL IMPRESSION: Patient has met her FIST goal demonstrating improved core stability and functional seated mobility. She continues to be limited with transition to  standing making ambulation not an option at the moment. She is improving with her ability to utilize her LE's during transfer but unable to perform full stand by end of session.  Patient's condition has the potential to improve in response to therapy. Maximum improvement is yet to be obtained. The anticipated improvement is attainable and reasonable in a generally predictable time.   Patient will benefit from skilled physical therapy to improve mobility, stability, and independence.   OBJECTIVE IMPAIRMENTS: Abnormal gait, cardiopulmonary status limiting activity, decreased activity tolerance, decreased balance, decreased coordination, decreased endurance, decreased mobility, difficulty walking, decreased ROM, decreased strength, hypomobility, increased fascial restrictions, impaired perceived functional ability, impaired flexibility,  impaired sensation, improper body mechanics, and postural dysfunction.   ACTIVITY LIMITATIONS: carrying, lifting, bending, sitting, standing, squatting, sleeping, stairs, transfers, bed mobility, continence, bathing, toileting, dressing, self feeding, reach over head, hygiene/grooming, locomotion level, and caring for others  PARTICIPATION LIMITATIONS: meal prep, cleaning, laundry, interpersonal relationship, driving, shopping, community activity, and church  PERSONAL FACTORS: Age, Past/current experiences, Time since onset of injury/illness/exacerbation, Transportation, and 3+ comorbidities: ack pain, CBP, chronic L shoulder pain, galactorrhea, neuropathy, HPV, hypercholestermeia, HTN, MS, osteopenia, PONV, wrist fracture  are also affecting patient's functional outcome.   REHAB POTENTIAL: Good  CLINICAL DECISION MAKING: Evolving/moderate complexity  EVALUATION COMPLEXITY: Moderate  PLAN:  PT FREQUENCY: 2x/week  PT DURATION: 12 weeks  PLANNED INTERVENTIONS: 97164- PT Re-evaluation, 97110-Therapeutic exercises, 97530- Therapeutic activity, 97112- Neuromuscular re-education, 97535- Self Care, 16109- Manual therapy, [phone]- Gait training, [phone]- Orthotic Fit/training, [phone]- Canalith repositioning, [id number]- Aquatic Therapy, [phone]- Electrical stimulation (unattended), [phone]- Electrical stimulation (manual), [id number]- Ultrasound, [id number]- Traction (mechanical), Patient/Family education, Balance training, Stair training, Taping, Dry Needling, Joint mobilization, Joint manipulation, Spinal manipulation, Spinal mobilization, Scar mobilization, Compression bandaging, Vestibular training, Visual/preceptual remediation/compensation, Cognitive remediation, DME instructions, Cryotherapy, Moist heat, and Biofeedback  PLAN FOR NEXT SESSION: sit to stands; try supported walking    Precious Bard, PT 08/14/2023, 10:59 AM

## 2023-08-13 NOTE — Telephone Encounter (Signed)
 Last seen on 02/22/23 per note " Continue baclofen and tizanidine for spasticity.  No 6 month follow up scheduled

## 2023-08-14 ENCOUNTER — Ambulatory Visit: Payer: PPO

## 2023-08-14 DIAGNOSIS — M6281 Muscle weakness (generalized): Secondary | ICD-10-CM

## 2023-08-14 DIAGNOSIS — R262 Difficulty in walking, not elsewhere classified: Secondary | ICD-10-CM

## 2023-08-14 DIAGNOSIS — H2513 Age-related nuclear cataract, bilateral: Secondary | ICD-10-CM | POA: Diagnosis not present

## 2023-08-14 DIAGNOSIS — R2681 Unsteadiness on feet: Secondary | ICD-10-CM

## 2023-08-14 DIAGNOSIS — G35 Multiple sclerosis: Secondary | ICD-10-CM | POA: Diagnosis not present

## 2023-08-15 NOTE — Therapy (Signed)
 OUTPATIENT PHYSICAL THERAPY NEURO TREATMENT     Patient Name: Lisa Williams MRN: [id number] DOB:1961-02-08, 63 y.o., female Today's Date: 08/15/2023   PCP: Einar Crow  REFERRING PROVIDER: Einar Crow  END OF SESSION:           Past Medical History:  Diagnosis Date   Abdominal pain, right upper quadrant    Back pain    Calculus of kidney 12/09/2013   Chronic back pain    unspecified   Chronic left shoulder pain 07/19/2015   Complication of anesthesia    Functional disorder of bladder    other   Galactorrhea 11/26/2014   Chronic    Hereditary and idiopathic neuropathy 08/19/2013   History of kidney stones    HPV test positive    Hypercholesteremia 08/19/2013   Hypertension    Incomplete bladder emptying    Microscopic hematuria    MS (multiple sclerosis) (HCC)    Muscle spasticity 05/21/2014   Nonspecific findings on examination of urine    other   Osteopenia    PONV (postoperative nausea and vomiting)    Status post laparoscopic supracervical hysterectomy 11/26/2014   Tobacco user 11/26/2014   Wrist fracture    Past Surgical History:  Procedure Laterality Date   bilateral tubal ligation  1996   BREAST CYST EXCISION Left 2002   CYST EXCISION Left 05/10/2022   Procedure: CYST REMOVAL;  Surgeon: Carolan Shiver, MD;  Location: [facility];  Service: General;  Laterality: Left;   FRACTURE SURGERY     KNEE SURGERY     right   LAPAROSCOPIC SUPRACERVICAL HYSTERECTOMY  08/05/2013   ORIF WRIST FRACTURE Left 01/17/2017   Procedure: OPEN REDUCTION INTERNAL FIXATION (ORIF) WRIST FRACTURE;  Surgeon: Lyndle Herrlich, MD;  Location: [facility];  Service: Orthopedics;  Laterality: Left;   RADIOLOGY WITH ANESTHESIA N/A 03/18/2020   Procedure: MRI WITH ANESTHESIA CERVICAL SPINE AND BRAIN  WITH AND WITHOUT CONTRAST;  Surgeon: Radiologist, Medication, MD;  Location: [facility];  Service: Radiology;  Laterality: N/A;   TUBAL LIGATION Bilateral    VAGINAL  HYSTERECTOMY  03/2006   Patient Active Problem List   Diagnosis Date Noted   Sinus tachycardia 07/07/2023   Hypokalemia 07/29/2022   Weakness of both lower extremities 07/29/2022   Abscess of left groin 11/15/2021   Abnormal LFTs 11/15/2021   Acute respiratory disease due to COVID-19 virus 11/21/2020   Weakness    Hypoalbuminemia due to protein-calorie malnutrition [facility])    Neurogenic bowel    Neurogenic bladder    Labile blood pressure    Neuropathic pain    Abscess of female pelvis    SVT (supraventricular tachycardia) (HCC)    Radial styloid tenosynovitis 03/12/2018   Wheelchair dependence 02/27/2018   Localized osteoporosis with current pathological fracture with routine healing 01/19/2017   Wrist fracture 01/16/2017   Sprain of ankle 03/23/2016   Closed fracture of lateral malleolus 03/16/2016   Health care maintenance 01/24/2016   Blood pressure elevated without history of HTN 10/25/2015   Essential hypertension 10/25/2015   Multiple sclerosis (HCC) 10/02/2015   Chronic left shoulder pain 07/19/2015   Multiple sclerosis exacerbation (HCC) 07/14/2015   MS (multiple sclerosis) (HCC) 11/26/2014   Increased body mass index 11/26/2014   HPV test positive 11/26/2014   Status post laparoscopic supracervical hysterectomy 11/26/2014   Galactorrhea 11/26/2014   Back ache 05/21/2014   Adiposity 05/21/2014   Disordered sleep 05/21/2014   Muscle spasticity 05/21/2014   Spasticity 05/21/2014   Calculus of kidney  12/09/2013   Renal colic 12/09/2013   Hypercholesteremia 08/19/2013   Hereditary and idiopathic neuropathy 08/19/2013   Hypercholesterolemia without hypertriglyceridemia 08/19/2013   Bladder infection, chronic 07/25/2012   Disorder of bladder function 07/25/2012   Incomplete bladder emptying 07/25/2012   Microscopic hematuria 07/25/2012   Right upper quadrant pain 07/25/2012    ONSET DATE: 1995  REFERRING DIAG: MS  THERAPY DIAG:  No diagnosis  found.  Rationale for Evaluation and Treatment: Rehabilitation  SUBJECTIVE:                                                                                                                                                                                             SUBJECTIVE STATEMENT: ***  Pt accompanied by: self  PERTINENT HISTORY:  Patient is returning to PT s/p hospitalization.  s/p ORIF of R tibia shaft fracture 09/23/2022. Patient has weakness in BLE with RLE>LLE. She drives with hand controls. Patient has been diagnosed with MS in 1995. PMH includes: back pain, CBP, chronic L shoulder pain, galactorrhea, neuropathy, HPV, hypercholestermeia, HTN, MS, osteopenia, PONV, wrist fracture. Additional order for other closed fracture of proximal end of R tibia with routine healing. Still has to use [employer].   PAIN:  Are you having pain?  Occasional pain in RLE; primarily in knee  PRECAUTIONS: Fall  RED FLAGS: None   WEIGHT BEARING RESTRICTIONS: No  FALLS: Has patient fallen in last 6 months? No  LIVING ENVIRONMENT: Lives with: lives with their family Lives in: House/apartment Stairs:  ramp Has following equipment at home: Dan Humphreys - 2 wheeled, Wheelchair (manual), shower chair, and Grab bars  PLOF: Independent with household mobility with device  PATIENT GOALS: to get her independence back. To be able to get into car, toilet, and get dressed independently  OBJECTIVE:  Note: Objective measures were completed at Evaluation unless otherwise noted.  DIAGNOSTIC FINDINGS: MRI of the brain 03/18/2020 showed multiple T2/FLAIR hyperintense foci in the periventricular, juxtacortical and deep white matter.  There were no infratentorial lesions noted.  None of the foci enhanced.   Due to severe claustrophobia, the study was done with conscious sedation in the hospital   COGNITION: Overall cognitive status: Within functional limits for tasks assessed   SENSATION: Lack of sensation in feet,  loss in bilateral lateral aspect of knee  COORDINATION: Does not have the strength for functional LE heel slide test     MUSCLE TONE: BLE mild tone    POSTURE: rounded shoulders, forward head, anterior pelvic tilt, and weight shift left    LOWER EXTREMITY MMT:    MMT Right Eval Left Eval  Hip flexion  0.9 1.5  Hip extension    Hip abduction 1.8 2.6  Hip adduction 3.1 1.9  Hip internal rotation    Hip external rotation    Knee flexion 0.9 1.5  Knee extension 0.8 1.2  Ankle dorsiflexion    Ankle plantarflexion    Ankle inversion    Ankle eversion    (Blank rows = not tested)  BED MOBILITY:  Assess in future session due to limited time  TRANSFERS: Assistive device utilized:  Bariatric RW with sit to stands, slide board to table   Sit to stand:  unable to reach full stand Stand to sit:  unable to reach full stand  Chair to chair:  slide board with CGA     GAIT: Unable to ambulate at this time.   FUNCTIONAL TESTS:  Sit to stand: tricep press with BUE; unable to bring arm to walker.    Function In Sitting Test (FIST)  (1/2 femur on surface; hips/knees flexed to 90deg)   - indicate bed or mat table / step stool if used  SCORING KEY: 4 = Independent (completes task independently & successfully) 3 = Verbal Cues/Increased Time (completes task independently & successfully and only needs more time/cues) 2 = Upper Extremity Support (must use UE for support or assistance to complete successfully) 1 = Needs Assistance (unable to complete w/o physical assist; DOCUMENT LEVEL: min, mod, max) 0 = Dependent (requires complete physical assist; unable to complete successfully even w/ physical assist)  Randomly Administer Once Throughout Exam  4 - Anterior Nudge (superior sternum)  4 - Posterior Nudge (between scapular spines)  4 - Lateral Nudge (to dominant side at acromion)     4 - Static sitting (30 seconds)  4 - Sitting, shake 'no' (left and right)  4 - Sitting, eyes  closed (30 seconds)   0 - Sitting, lift foot (dominant side, lift foot 1 inch twice)    2 - Pick up object from behind (object at midline, hands breadth posterior)  3 - Forward reach (use dominant arm, must complete full motion) 2 - Lateral reach (use dominant arm, clear opposite ischial tuberosity) 2 - Pick up object from floor (from between feet)   2 - Posterior scooting (move backwards 2 inches)  2 - Anterior scooting (move forward 2 inches)  2 - Lateral scooting (move to dominant side 2 inches)    TOTAL = 39/56  Notes/comments: slide board tranfer to/from table   MCD > 5 points MCID for IP REHAB > 6 points                                                                                                                                TREATMENT DATE: 08/15/23 Physical therapy treatment session today consisted of completing assessment of goals and administration of testing as demonstrated and documented in flow sheet, treatment, and goals section of this note. Addition treatments may be found below.    Function In  Sitting Test (FIST)  (1/2 femur on surface; hips/knees flexed to 90deg)   - indicate bed or mat table / step stool if used  SCORING KEY: 4 = Independent (completes task independently & successfully) 3 = Verbal Cues/Increased Time (completes task independently & successfully and only needs more time/cues) 2 = Upper Extremity Support (must use UE for support or assistance to complete successfully) 1 = Needs Assistance (unable to complete w/o physical assist; DOCUMENT LEVEL: min, mod, max) 0 = Dependent (requires complete physical assist; unable to complete successfully even w/ physical assist)  Randomly Administer Once Throughout Exam  4 - Anterior Nudge (superior sternum)  4 - Posterior Nudge (between scapular spines)  4 - Lateral Nudge (to dominant side at acromion)     4 - Static sitting (30 seconds)  4 - Sitting, shake 'no' (left and right)  4 - Sitting, eyes  closed (30 seconds)   0 - Sitting, lift foot (dominant side, lift foot 1 inch twice)    4 - Pick up object from behind (object at midline, hands breadth posterior)  4 - Forward reach (use dominant arm, must complete full motion) 4 - Lateral reach (use dominant arm, clear opposite ischial tuberosity) 2 - Pick up object from floor (from between feet)   3 - Posterior scooting (move backwards 2 inches)  3 - Anterior scooting (move forward 2 inches)  3 - Lateral scooting (move to dominant side 2 inches)    TOTAL = 47/56    MCD > 5 points MCID for IP REHAB > 6 points   Sit to stand:  Multiple partial attempts  Manual STM to R paraspinal and quadratus lumborum for spasm reduction x 9 minutes    Pt requires significant therapeutic rest breaks throughout the session due to fatigue.    PATIENT EDUCATION: Education details: goals, POC, HEP  Person educated: Patient Education method: Explanation, Demonstration, Tactile cues, Verbal cues, and Handouts Education comprehension: verbalized understanding, returned demonstration, verbal cues required, tactile cues required, and needs further education  HOME EXERCISE PROGRAM: Stand 3x/day in stander   GOALS: Goals reviewed with patient? Yes  SHORT TERM GOALS: Target date: 08/08/2023    Patient will be independent in home exercise program to improve strength/mobility for better functional independence with ADLs.  Baseline: 4/8: compliance Goal status: MET    LONG TERM GOALS: Target date: 10/04/2023    Patient will tolerate five consecutive stands with UE support from wheelchair to standing to improve functional mobility.  Baseline: unable to perform 4/8:unable to perform full stand Goal status: Ongoing   2.  Patient will ambulate 10 ft with bRW with wheelchair follow.  Baseline:  unable to ambulate 4/8: unable to ambulate Goal status: partially met  3.  Patient will improve FIST score >6 points to demonstrate improved  stability and ability to perform ADLs.  Baseline:  3/6: 39/56 4/8: 47/ 56  Goal status: MET  4.  Patient will perform toileting with mod I at home for improved independence.  Baseline: 3/6: requires assistance 4/8: requires dependence on machine Goal status: Partially Met    ASSESSMENT:  CLINICAL IMPRESSION: *** Patient will benefit from skilled physical therapy to improve mobility, stability, and independence.   OBJECTIVE IMPAIRMENTS: Abnormal gait, cardiopulmonary status limiting activity, decreased activity tolerance, decreased balance, decreased coordination, decreased endurance, decreased mobility, difficulty walking, decreased ROM, decreased strength, hypomobility, increased fascial restrictions, impaired perceived functional ability, impaired flexibility, impaired sensation, improper body mechanics, and postural dysfunction.   ACTIVITY LIMITATIONS: carrying, lifting,  bending, sitting, standing, squatting, sleeping, stairs, transfers, bed mobility, continence, bathing, toileting, dressing, self feeding, reach over head, hygiene/grooming, locomotion level, and caring for others  PARTICIPATION LIMITATIONS: meal prep, cleaning, laundry, interpersonal relationship, driving, shopping, community activity, and church  PERSONAL FACTORS: Age, Past/current experiences, Time since onset of injury/illness/exacerbation, Transportation, and 3+ comorbidities: ack pain, CBP, chronic L shoulder pain, galactorrhea, neuropathy, HPV, hypercholestermeia, HTN, MS, osteopenia, PONV, wrist fracture  are also affecting patient's functional outcome.   REHAB POTENTIAL: Good  CLINICAL DECISION MAKING: Evolving/moderate complexity  EVALUATION COMPLEXITY: Moderate  PLAN:  PT FREQUENCY: 2x/week  PT DURATION: 12 weeks  PLANNED INTERVENTIONS: 97164- PT Re-evaluation, 97110-Therapeutic exercises, 97530- Therapeutic activity, 97112- Neuromuscular re-education, 97535- Self Care, 11914- Manual therapy, [phone]-  Gait training, [phone]- Orthotic Fit/training, [phone]- Canalith repositioning, [id number]- Aquatic Therapy, [phone]- Electrical stimulation (unattended), [phone]- Electrical stimulation (manual), [id number]- Ultrasound, 13244- Traction (mechanical), Patient/Family education, Balance training, Stair training, Taping, Dry Needling, Joint mobilization, Joint manipulation, Spinal manipulation, Spinal mobilization, Scar mobilization, Compression bandaging, Vestibular training, Visual/preceptual remediation/compensation, Cognitive remediation, DME instructions, Cryotherapy, Moist heat, and Biofeedback  PLAN FOR NEXT SESSION: sit to stands; try supported walking    Precious Bard, PT 08/15/2023, 10:47 AM

## 2023-08-16 ENCOUNTER — Ambulatory Visit

## 2023-08-16 DIAGNOSIS — R2689 Other abnormalities of gait and mobility: Secondary | ICD-10-CM

## 2023-08-16 DIAGNOSIS — R262 Difficulty in walking, not elsewhere classified: Secondary | ICD-10-CM

## 2023-08-16 DIAGNOSIS — R2681 Unsteadiness on feet: Secondary | ICD-10-CM

## 2023-08-16 DIAGNOSIS — M6281 Muscle weakness (generalized): Secondary | ICD-10-CM | POA: Diagnosis not present

## 2023-08-16 DIAGNOSIS — R278 Other lack of coordination: Secondary | ICD-10-CM

## 2023-08-16 DIAGNOSIS — G35 Multiple sclerosis: Secondary | ICD-10-CM

## 2023-08-16 NOTE — Therapy (Signed)
 OUTPATIENT OCCUPATIONAL THERAPY NEURO EVALUATION  Patient Name: Lisa Williams MRN: [id number] DOB:1960-10-09, 63 y.o., female Today's Date: 08/18/2023  PCP: Dr. Overton Williams   Neurologist Dr. Octavio Williams at [facility] REFERRING PROVIDER: Dr. Overton Williams    END OF SESSION:  OT End of Session - 08/18/23 1956     Visit Number 1    Number of Visits 24    Date for OT Re-Evaluation 11/08/23    Progress Note Due on Visit 10    OT Start Time 1145    OT Stop Time 1230    OT Time Calculation (min) 45 min    Equipment Utilized During Treatment manual wc    Activity Tolerance Patient tolerated treatment well    Behavior During Therapy [name] for tasks assessed/performed            Past Medical History:  Diagnosis Date   Abdominal pain, right upper quadrant    Back pain    Calculus of kidney 12/09/2013   Chronic back pain    unspecified   Chronic left shoulder pain 07/19/2015   Complication of anesthesia    Functional disorder of bladder    other   Galactorrhea 11/26/2014   Chronic    Hereditary and idiopathic neuropathy 08/19/2013   History of kidney stones    HPV test positive    Hypercholesteremia 08/19/2013   Hypertension    Incomplete bladder emptying    Microscopic hematuria    MS (multiple sclerosis) (HCC)    Muscle spasticity 05/21/2014   Nonspecific findings on examination of urine    other   Osteopenia    PONV (postoperative nausea and vomiting)    Status post laparoscopic supracervical hysterectomy 11/26/2014   Tobacco user 11/26/2014   Wrist fracture    Past Surgical History:  Procedure Laterality Date   bilateral tubal ligation  1996   BREAST CYST EXCISION Left 2002   CYST EXCISION Left 05/10/2022   Procedure: CYST REMOVAL;  Surgeon: Lisa Grego, MD;  Location: [facility] ORS;  Service: General;  Laterality: Left;   FRACTURE SURGERY     KNEE SURGERY     right   LAPAROSCOPIC SUPRACERVICAL HYSTERECTOMY  08/05/2013   ORIF WRIST FRACTURE Left  01/17/2017   Procedure: OPEN REDUCTION INTERNAL FIXATION (ORIF) WRIST FRACTURE;  Surgeon: Lisa Moons, MD;  Location: [facility] ORS;  Service: Orthopedics;  Laterality: Left;   RADIOLOGY WITH ANESTHESIA N/A 03/18/2020   Procedure: MRI WITH ANESTHESIA CERVICAL SPINE AND BRAIN  WITH AND WITHOUT CONTRAST;  Surgeon: Radiologist, Medication, MD;  Location: [facility] OR;  Service: Radiology;  Laterality: N/A;   TUBAL LIGATION Bilateral    VAGINAL HYSTERECTOMY  03/2006   Patient Active Problem List   Diagnosis Date Noted   Sinus tachycardia 07/07/2023   Hypokalemia 07/29/2022   Weakness of both lower extremities 07/29/2022   Abscess of left groin 11/15/2021   Abnormal LFTs 11/15/2021   Acute respiratory disease due to COVID-19 virus 11/21/2020   Weakness    Hypoalbuminemia due to protein-calorie malnutrition [facility])    Neurogenic bowel    Neurogenic bladder    Labile blood pressure    Neuropathic pain    Abscess of female pelvis    SVT (supraventricular tachycardia) (HCC)    Radial styloid tenosynovitis 03/12/2018   Wheelchair dependence 02/27/2018   Localized osteoporosis with current pathological fracture with routine healing 01/19/2017   Wrist fracture 01/16/2017   Sprain of ankle 03/23/2016   Closed fracture of lateral malleolus 03/16/2016   Health care  maintenance 01/24/2016   Blood pressure elevated without history of HTN 10/25/2015   Essential hypertension 10/25/2015   Multiple sclerosis (HCC) 10/02/2015   Chronic left shoulder pain 07/19/2015   Multiple sclerosis exacerbation (HCC) 07/14/2015   MS (multiple sclerosis) (HCC) 11/26/2014   Increased body mass index 11/26/2014   HPV test positive 11/26/2014   Status post laparoscopic supracervical hysterectomy 11/26/2014   Galactorrhea 11/26/2014   Back ache 05/21/2014   Adiposity 05/21/2014   Disordered sleep 05/21/2014   Muscle spasticity 05/21/2014   Spasticity 05/21/2014   Calculus of kidney 12/09/2013   Renal colic 12/09/2013    Hypercholesteremia 08/19/2013   Hereditary and idiopathic neuropathy 08/19/2013   Hypercholesterolemia without hypertriglyceridemia 08/19/2013   Bladder infection, chronic 07/25/2012   Disorder of bladder function 07/25/2012   Incomplete bladder emptying 07/25/2012   Microscopic hematuria 07/25/2012   Right upper quadrant pain 07/25/2012   ONSET DATE: 5/24 Lisa Williams and broke R leg which caused beginning of ADL decline; diagnosed with MS in 1995)  REFERRING DIAG: MS  THERAPY DIAG:  Muscle weakness (generalized)  Other lack of coordination  Multiple sclerosis exacerbation (HCC)  Rationale for Evaluation and Treatment: Rehabilitation  SUBJECTIVE:  SUBJECTIVE STATEMENT: Pt reports that she was able to manage her self care with mod I prior to breaking her leg last year. Pt accompanied by: self  PERTINENT HISTORY: Pt reports increased difficulty with basic self care tasks since breaking her R leg last May.  Since that time, mobility and ADLs have declined, with pt requiring assist from private caregivers and family to manage bathing, toileting, dressing, and functional transfers.  PRECAUTIONS: Fall  WEIGHT BEARING RESTRICTIONS: No  PAIN:  Are you having pain? No; occasional pain in R lower back, but not today  FALLS: Has patient fallen in last 6 months? No Last fall was May 17 of 2024  LIVING ENVIRONMENT: Lives with: lives with their family (including mother who has dementia and sister Lisa Williams)  Lives in: 2 level home but pt resides on main level Stairs: ramp  Has following equipment at home: Wheelchair (manual), Shower bench, and hand held shower shower, 1 grab bar in the shower,  3in1 commode, sit to stand lift (electric), FWW, hospital bed  PLOF: modified indep with ADL/IADLs prior to May of 1610   PATIENT GOALS: Increase independence with basic self care tasks  OBJECTIVE:  Note: Objective measures were completed at Evaluation unless otherwise noted.  HAND DOMINANCE:  Right  ADLs:  Overall ADLs: assist provided from paid caregiver and sister Transfers/ambulation related to ADLs: sit to stand lift for all transfers, set up for sliding board in/out of bed  Eating: indep  Grooming: modified indep (wc level in mom's bathroom)  UB Dressing: set up (can't access her bedroom closet)  LB Dressing: able to sit on side of bed to don all LB clothing; assist to hike pants/underwear Toileting: assist with clothing management d/t standing within electric lift Bathing: bed bath with sister helping to wash backside Tub Shower transfers: N/A; pt has walk in shower in mom's bathroom (wc does fit in this bathroom but pt reports inability to transfer from wc and requires arm rests on a bench for a successful transfer attempt from any DME).  Tub bench is in pt's bathroom, but lift does not fit through bathroom doorway and pt reports inability to transfer up from tub bench (currently unable to manage either transfer)   Equipment:  see above  IADLs: Shopping: [employer] for shopping Light housekeeping: modified  indep from wc level Meal Prep: modified indep from wc level Community mobility: relies on community transportation or family members Medication management: indep Landscape architect: indep Handwriting:  NT; pt denies any FMC challenges  MOBILITY STATUS: Hx of falls  POSTURE COMMENTS:  Rounded shoulders, forward head, anterior pelvic tilt, and weight shift left   ACTIVITY TOLERANCE: Activity tolerance: Per PT; currently standing 30-60 sec within Light Gait harness/lift; currently non-ambulatory  UPPER EXTREMITY ROM:  BUEs WFL  UPPER EXTREMITY MMT:     MMT Right eval Left eval  Shoulder flexion 4+ 4  Shoulder abduction 4+ 4  Shoulder adduction    Shoulder extension    Shoulder internal rotation 4+ 4+  Shoulder external rotation 4 4  Middle trapezius    Lower trapezius    Elbow flexion 4+ 4+  Elbow extension 4+ 4+  Wrist flexion 4+ 4+  Wrist  extension 4+ 4+  Wrist ulnar deviation    Wrist radial deviation    Wrist pronation    Wrist supination    (Blank rows = not tested)  HAND FUNCTION/COORDINATION:  R/L WNL; pt denies any coordination deficits/ able to manipulate pills/clothing fasteners/etc without difficulty  SENSATION: WFL  EDEMA: No visible edema in BUEs  MUSCLE TONE: BUEs WNL  COGNITION: Overall cognitive status: Within functional limits for tasks assessed  VISION: wears glasses all the time, no reports of diplopia  PERCEPTION: Not tested  PRAXIS: WFL  OBSERVATIONS:  Pt pleasant, cooperative, and motivated to work towards improving indep with ADLs.                                                                                                     TREATMENT DATE: 08/16/23 Evaluation completed.     Self Care: -AE/DME education to maximize indep with daily tasks  -Reviewed bed bath routine; recommended side lying for increasing indep with posterior bathing, 1 hand on bed rail to maintain side lying as needed    PATIENT EDUCATION: Education details: OT role, goals, poc Person educated: Patient Education method: Explanation Education comprehension: verbalized understanding  HOME EXERCISE PROGRAM: To be initiated in upcoming sessions  GOALS: Goals reviewed with patient? Yes  SHORT TERM GOALS: Target date: 09/27/23  Pt will perform bed bed bath with set up.  Baseline: Eval: Min-mod A for posterior washing Goal status: INITIAL  2.  Pt will utilize sliding board for wc<>drop arm commode transfer with SBA. Baseline: Eval: Currently using electric lift for transfer to [facility] Goal status: INITIAL  3.  Pt will be indep to perform HEP for maintaining BUE strength for ADLs and functional transfers. Baseline: Eval: HEP not yet initiated Goal status: INITIAL  LONG TERM GOALS: Target date: 11/08/23  Pt will perform sink bath with min A. Baseline: Eval: Currently bed bathing d/t inability to stand at sink  without electric lift (lift does not fit into pt's bathroom).   Goal status: INITIAL  2.  Pt will perform wc<>tub bench transfer with min A. Baseline: Eval: Unable Goal status: INITIAL  3.  Pt will perform squat pivot transfer wc<>BSC with SBA.  Baseline: Eval: Eval: Currently using electric lift for transfer to [facility] Goal status: INITIAL  4. Pt will perform scoot transfer wc<>toilet using grab bars to allow toileting in community setting.  Baseline: Eval: Pt limits community outings d/t requiring use of lift for [facility] transfers.   Goal status: INITIAL  5.  Pt will tolerate standing x1 min to manage clothing in prep for toileting. Baseline: Eval: Currently standing in electric lift only, or within parallel bars without lift Goal status: INITIAL   ASSESSMENT:  CLINICAL IMPRESSION: Patient is a 63 y.o. female who was seen today for occupational therapy evaluation for functional decline related to MS.  Pt reports the decline in ADLs and mobility began after she fell last May and broke her R leg.  Since then, pt has required ADL assist from sister and private caregivers.  Prior to RLE fx, pt was ambulatory with a RW in the home, which allowed her to access her bathroom shower and toilet.  Pt is now bed bathing with assist, transferring out of bed with a sliding board, and using an electric lift to transfer out of her wc to a BSC.  Pt specifically wants to improve her mobility and standing tolerance to allow her to walk into her bathroom for a sink bath, to use the toilet, and to access her shower.  Pt is motivated and eager to maximize indep with ADLs as to reduce burden of care on caregivers.  Pt will benefit from skilled OT to work towards above noted goals in OT poc.    PERFORMANCE DEFICITS: in functional skills including ADLs, IADLs, strength, pain, flexibility, Gross motor control, mobility, balance, body mechanics, endurance, and decreased knowledge of use of DME, and psychosocial skills  including coping strategies, environmental adaptation, habits, and routines and behaviors.   IMPAIRMENTS: are limiting patient from ADLs, IADLs, and social participation.   CO-MORBIDITIES: has co-morbidities such as neuropathy, back pain, obesity, MS, HTN  that affects occupational performance. Patient will benefit from skilled OT to address above impairments and improve overall function.  MODIFICATION OR ASSISTANCE TO COMPLETE EVALUATION: No modification of tasks or assist necessary to complete an evaluation.  OT OCCUPATIONAL PROFILE AND HISTORY: Detailed assessment: Review of records and additional review of physical, cognitive, psychosocial history related to current functional performance.  CLINICAL DECISION MAKING: Moderate - several treatment options, min-mod task modification necessary  REHAB POTENTIAL: Good  EVALUATION COMPLEXITY: Moderate    PLAN:  OT FREQUENCY: 2x/week  OT DURATION: 12 weeks  PLANNED INTERVENTIONS: 97168 OT Re-evaluation, 97535 self care/ADL training, 47829 therapeutic exercise, 97530 therapeutic activity, 97112 neuromuscular re-education, 97140 manual therapy, 97116 gait training, 56213 moist heat, 97010 cryotherapy, balance training, functional mobility training, psychosocial skills training, energy conservation, coping strategies training, patient/family education, and DME and/or AE instructions  RECOMMENDED OTHER SERVICES: None at this time (Pt currently receiving PT services in this clinic)  CONSULTED AND AGREED WITH PLAN OF CARE: Patient  PLAN FOR NEXT SESSION: see above  Marcus Sewer, MS, OTR/L   Casandra Claw, OT 08/18/2023, 7:57 PM

## 2023-08-17 DIAGNOSIS — G35 Multiple sclerosis: Secondary | ICD-10-CM | POA: Diagnosis not present

## 2023-08-17 DIAGNOSIS — G609 Hereditary and idiopathic neuropathy, unspecified: Secondary | ICD-10-CM | POA: Diagnosis not present

## 2023-08-20 NOTE — Therapy (Signed)
 OUTPATIENT PHYSICAL THERAPY NEURO TREATMENT     Patient Name: Lisa Williams MRN: [id number] DOB:1960-11-28, 63 y.o., female Today's Date: 08/21/2023   PCP: Einar Crow  REFERRING PROVIDER: Einar Crow  END OF SESSION:  PT End of Session - 08/21/23 1127     Visit Number 12    Number of Visits 24    Date for PT Re-Evaluation 10/04/23    PT Start Time 1100    PT Stop Time 1140    PT Time Calculation (min) 40 min    Equipment Utilized During Treatment Gait belt    Activity Tolerance Patient tolerated treatment well;No increased pain    Behavior During Therapy [facility] for tasks assessed/performed                      Past Medical History:  Diagnosis Date   Abdominal pain, right upper quadrant    Back pain    Calculus of kidney 12/09/2013   Chronic back pain    unspecified   Chronic left shoulder pain 07/19/2015   Complication of anesthesia    Functional disorder of bladder    other   Galactorrhea 11/26/2014   Chronic    Hereditary and idiopathic neuropathy 08/19/2013   History of kidney stones    HPV test positive    Hypercholesteremia 08/19/2013   Hypertension    Incomplete bladder emptying    Microscopic hematuria    MS (multiple sclerosis) (HCC)    Muscle spasticity 05/21/2014   Nonspecific findings on examination of urine    other   Osteopenia    PONV (postoperative nausea and vomiting)    Status post laparoscopic supracervical hysterectomy 11/26/2014   Tobacco user 11/26/2014   Wrist fracture    Past Surgical History:  Procedure Laterality Date   bilateral tubal ligation  1996   BREAST CYST EXCISION Left 2002   CYST EXCISION Left 05/10/2022   Procedure: CYST REMOVAL;  Surgeon: Carolan Shiver, MD;  Location: [facility] ORS;  Service: General;  Laterality: Left;   FRACTURE SURGERY     KNEE SURGERY     right   LAPAROSCOPIC SUPRACERVICAL HYSTERECTOMY  08/05/2013   ORIF WRIST FRACTURE Left 01/17/2017   Procedure: OPEN  REDUCTION INTERNAL FIXATION (ORIF) WRIST FRACTURE;  Surgeon: Lyndle Herrlich, MD;  Location: [facility] ORS;  Service: Orthopedics;  Laterality: Left;   RADIOLOGY WITH ANESTHESIA N/A 03/18/2020   Procedure: MRI WITH ANESTHESIA CERVICAL SPINE AND BRAIN  WITH AND WITHOUT CONTRAST;  Surgeon: Radiologist, Medication, MD;  Location: [facility] OR;  Service: Radiology;  Laterality: N/A;   TUBAL LIGATION Bilateral    VAGINAL HYSTERECTOMY  03/2006   Patient Active Problem List   Diagnosis Date Noted   Sinus tachycardia 07/07/2023   Hypokalemia 07/29/2022   Weakness of both lower extremities 07/29/2022   Abscess of left groin 11/15/2021   Abnormal LFTs 11/15/2021   Acute respiratory disease due to COVID-19 virus 11/21/2020   Weakness    Hypoalbuminemia due to protein-calorie malnutrition [facility])    Neurogenic bowel    Neurogenic bladder    Labile blood pressure    Neuropathic pain    Abscess of female pelvis    SVT (supraventricular tachycardia) (HCC)    Radial styloid tenosynovitis 03/12/2018   Wheelchair dependence 02/27/2018   Localized osteoporosis with current pathological fracture with routine healing 01/19/2017   Wrist fracture 01/16/2017   Sprain of ankle 03/23/2016   Closed fracture of lateral malleolus 03/16/2016   Health care maintenance 01/24/2016  Blood pressure elevated without history of HTN 10/25/2015   Essential hypertension 10/25/2015   Multiple sclerosis (HCC) 10/02/2015   Chronic left shoulder pain 07/19/2015   Multiple sclerosis exacerbation (HCC) 07/14/2015   MS (multiple sclerosis) (HCC) 11/26/2014   Increased body mass index 11/26/2014   HPV test positive 11/26/2014   Status post laparoscopic supracervical hysterectomy 11/26/2014   Galactorrhea 11/26/2014   Back ache 05/21/2014   Adiposity 05/21/2014   Disordered sleep 05/21/2014   Muscle spasticity 05/21/2014   Spasticity 05/21/2014   Calculus of kidney 12/09/2013   Renal colic 12/09/2013   Hypercholesteremia 08/19/2013    Hereditary and idiopathic neuropathy 08/19/2013   Hypercholesterolemia without hypertriglyceridemia 08/19/2013   Bladder infection, chronic 07/25/2012   Disorder of bladder function 07/25/2012   Incomplete bladder emptying 07/25/2012   Microscopic hematuria 07/25/2012   Right upper quadrant pain 07/25/2012    ONSET DATE: 1995  REFERRING DIAG: MS  THERAPY DIAG:  Muscle weakness (generalized)  Multiple sclerosis exacerbation (HCC)  Difficulty in walking, not elsewhere classified  Unsteadiness on feet  Rationale for Evaluation and Treatment: Rehabilitation  SUBJECTIVE:                                                                                                                                                                                             SUBJECTIVE STATEMENT: Patient has PT prior to OT today. Is eager to progress her mobility.   Pt accompanied by: self  PERTINENT HISTORY:  Patient is returning to PT s/p hospitalization.  s/p ORIF of R tibia shaft fracture 09/23/2022. Patient has weakness in BLE with RLE>LLE. She drives with hand controls. Patient has been diagnosed with MS in 1995. PMH includes: back pain, CBP, chronic L shoulder pain, galactorrhea, neuropathy, HPV, hypercholestermeia, HTN, MS, osteopenia, PONV, wrist fracture. Additional order for other closed fracture of proximal end of R tibia with routine healing. Still has to use [employer].   PAIN:  Are you having pain?  Occasional pain in RLE; primarily in knee  PRECAUTIONS: Fall  RED FLAGS: None   WEIGHT BEARING RESTRICTIONS: No  FALLS: Has patient fallen in last 6 months? No  LIVING ENVIRONMENT: Lives with: lives with their family Lives in: House/apartment Stairs:  ramp Has following equipment at home: Otho Blitz - 2 wheeled, Wheelchair (manual), shower chair, and Grab bars  PLOF: Independent with household mobility with device  PATIENT GOALS: to get her independence back. To be able to get into  car, toilet, and get dressed independently  OBJECTIVE:  Note: Objective measures were completed at Evaluation unless otherwise noted.  DIAGNOSTIC FINDINGS: MRI of the brain 03/18/2020 showed  multiple T2/FLAIR hyperintense foci in the periventricular, juxtacortical and deep white matter.  There were no infratentorial lesions noted.  None of the foci enhanced.   Due to severe claustrophobia, the study was done with conscious sedation in the hospital   COGNITION: Overall cognitive status: Within functional limits for tasks assessed   SENSATION: Lack of sensation in feet, loss in bilateral lateral aspect of knee  COORDINATION: Does not have the strength for functional LE heel slide test     MUSCLE TONE: BLE mild tone    POSTURE: rounded shoulders, forward head, anterior pelvic tilt, and weight shift left    LOWER EXTREMITY MMT:    MMT Right Eval Left Eval  Hip flexion 0.9 1.5  Hip extension    Hip abduction 1.8 2.6  Hip adduction 3.1 1.9  Hip internal rotation    Hip external rotation    Knee flexion 0.9 1.5  Knee extension 0.8 1.2  Ankle dorsiflexion    Ankle plantarflexion    Ankle inversion    Ankle eversion    (Blank rows = not tested)  BED MOBILITY:  Assess in future session due to limited time  TRANSFERS: Assistive device utilized:  Bariatric RW with sit to stands, slide board to table   Sit to stand:  unable to reach full stand Stand to sit:  unable to reach full stand  Chair to chair:  slide board with CGA     GAIT: Unable to ambulate at this time.   FUNCTIONAL TESTS:  Sit to stand: tricep press with BUE; unable to bring arm to walker.    Function In Sitting Test (FIST)  (1/2 femur on surface; hips/knees flexed to 90deg)   - indicate bed or mat table / step stool if used  SCORING KEY: 4 = Independent (completes task independently & successfully) 3 = Verbal Cues/Increased Time (completes task independently & successfully and only needs more  time/cues) 2 = Upper Extremity Support (must use UE for support or assistance to complete successfully) 1 = Needs Assistance (unable to complete w/o physical assist; DOCUMENT LEVEL: min, mod, max) 0 = Dependent (requires complete physical assist; unable to complete successfully even w/ physical assist)  Randomly Administer Once Throughout Exam  4 - Anterior Nudge (superior sternum)  4 - Posterior Nudge (between scapular spines)  4 - Lateral Nudge (to dominant side at acromion)     4 - Static sitting (30 seconds)  4 - Sitting, shake 'no' (left and right)  4 - Sitting, eyes closed (30 seconds)   0 - Sitting, lift foot (dominant side, lift foot 1 inch twice)    2 - Pick up object from behind (object at midline, hands breadth posterior)  3 - Forward reach (use dominant arm, must complete full motion) 2 - Lateral reach (use dominant arm, clear opposite ischial tuberosity) 2 - Pick up object from floor (from between feet)   2 - Posterior scooting (move backwards 2 inches)  2 - Anterior scooting (move forward 2 inches)  2 - Lateral scooting (move to dominant side 2 inches)    TOTAL = 39/56  Notes/comments: slide board tranfer to/from table   MCD > 5 points MCID for IP REHAB > 6 points  Function In Sitting Test (FIST) 08/14/23 (1/2 femur on surface; hips/knees flexed to 90deg)   - indicate bed or mat table / step stool if used  SCORING KEY: 4 = Independent (completes task independently & successfully) 3 = Verbal Cues/Increased Time (completes task independently &  successfully and only needs more time/cues) 2 = Upper Extremity Support (must use UE for support or assistance to complete successfully) 1 = Needs Assistance (unable to complete w/o physical assist; DOCUMENT LEVEL: min, mod, max) 0 = Dependent (requires complete physical assist; unable to complete successfully even w/ physical assist)  Randomly Administer Once Throughout Exam  4 - Anterior Nudge (superior sternum)  4 -  Posterior Nudge (between scapular spines)  4 - Lateral Nudge (to dominant side at acromion)     4 - Static sitting (30 seconds)  4 - Sitting, shake 'no' (left and right)  4 - Sitting, eyes closed (30 seconds)   0 - Sitting, lift foot (dominant side, lift foot 1 inch twice)    4 - Pick up object from behind (object at midline, hands breadth posterior)  4 - Forward reach (use dominant arm, must complete full motion) 4 - Lateral reach (use dominant arm, clear opposite ischial tuberosity) 2 - Pick up object from floor (from between feet)   3 - Posterior scooting (move backwards 2 inches)  3 - Anterior scooting (move forward 2 inches)  3 - Lateral scooting (move to dominant side 2 inches)    TOTAL = 47/56    MCD > 5 points MCID for IP REHAB > 6 points                                                                                                                              TREATMENT DATE: 08/21/23 TherAct:  All activities performed with gait belt and CGA unless otherwise noted.   In // bars: Sit to stand; ambulate length of // bars with assistance wheelchair follow and cues for R weight shift to clear L foot due to limited acceptance of weight on RLE;unable to make full length due to fatigue Partial sit to stand with focus on LE only 5x;   Therex Yellow dynadisc  -df 10x, pf 10x each LE Seated on dynadisc: -static position 60 seconds -medial/lateral 10x each direction -forward/backward 10x each direction -hand clap for pertubation  Adduction ball squeeze 10x; 2 sets Slider AAROM with assistance 10x each LE     PATIENT EDUCATION: Education details: goals, POC, HEP  Person educated: Patient Education method: Explanation, Demonstration, Tactile cues, Verbal cues, and Handouts Education comprehension: verbalized understanding, returned demonstration, verbal cues required, tactile cues required, and needs further education  HOME EXERCISE PROGRAM: Stand 3x/day in stander    GOALS: Goals reviewed with patient? Yes  SHORT TERM GOALS: Target date: 08/08/2023    Patient will be independent in home exercise program to improve strength/mobility for better functional independence with ADLs.  Baseline: 4/8: compliance Goal status: MET    LONG TERM GOALS: Target date: 10/04/2023    Patient will tolerate five consecutive stands with UE support from wheelchair to standing to improve functional mobility.  Baseline: unable to perform 4/8:unable to perform full stand Goal status: Ongoing   2.  Patient  will ambulate 10 ft with bRW with wheelchair follow.  Baseline:  unable to ambulate 4/8: unable to ambulate Goal status: partially met  3.  Patient will improve FIST score >6 points to demonstrate improved stability and ability to perform ADLs.  Baseline:  3/6: 39/56 4/8: 47/ 56  Goal status: MET  4.  Patient will perform toileting with mod I at home for improved independence.  Baseline: 3/6: requires assistance 4/8: requires dependence on machine Goal status: Partially Met    ASSESSMENT:  CLINICAL IMPRESSION: Patient is highly motivated throughout session. She is unable to ambulate full distance across // bars this session and seated interventions deferred due to due to limited stability in standing this session. Patient will benefit from skilled physical therapy to improve mobility, stability, and independence.   OBJECTIVE IMPAIRMENTS: Abnormal gait, cardiopulmonary status limiting activity, decreased activity tolerance, decreased balance, decreased coordination, decreased endurance, decreased mobility, difficulty walking, decreased ROM, decreased strength, hypomobility, increased fascial restrictions, impaired perceived functional ability, impaired flexibility, impaired sensation, improper body mechanics, and postural dysfunction.   ACTIVITY LIMITATIONS: carrying, lifting, bending, sitting, standing, squatting, sleeping, stairs, transfers, bed mobility,  continence, bathing, toileting, dressing, self feeding, reach over head, hygiene/grooming, locomotion level, and caring for others  PARTICIPATION LIMITATIONS: meal prep, cleaning, laundry, interpersonal relationship, driving, shopping, community activity, and church  PERSONAL FACTORS: Age, Past/current experiences, Time since onset of injury/illness/exacerbation, Transportation, and 3+ comorbidities: ack pain, CBP, chronic L shoulder pain, galactorrhea, neuropathy, HPV, hypercholestermeia, HTN, MS, osteopenia, PONV, wrist fracture  are also affecting patient's functional outcome.   REHAB POTENTIAL: Good  CLINICAL DECISION MAKING: Evolving/moderate complexity  EVALUATION COMPLEXITY: Moderate  PLAN:  PT FREQUENCY: 2x/week  PT DURATION: 12 weeks  PLANNED INTERVENTIONS: 97164- PT Re-evaluation, 97110-Therapeutic exercises, 97530- Therapeutic activity, 97112- Neuromuscular re-education, 97535- Self Care, 16109- Manual therapy, [phone]- Gait training, [phone]- Orthotic Fit/training, [phone]- Canalith repositioning, [id number]- Aquatic Therapy, [phone]- Electrical stimulation (unattended), [phone]- Electrical stimulation (manual), [id number]- Ultrasound, [id number]- Traction (mechanical), Patient/Family education, Balance training, Stair training, Taping, Dry Needling, Joint mobilization, Joint manipulation, Spinal manipulation, Spinal mobilization, Scar mobilization, Compression bandaging, Vestibular training, Visual/preceptual remediation/compensation, Cognitive remediation, DME instructions, Cryotherapy, Moist heat, and Biofeedback  PLAN FOR NEXT SESSION: sit to stands; try supported walking    Maysel Mccolm, PT 08/21/2023, 12:46 PM

## 2023-08-21 ENCOUNTER — Ambulatory Visit

## 2023-08-21 ENCOUNTER — Ambulatory Visit: Payer: PPO

## 2023-08-21 DIAGNOSIS — G35 Multiple sclerosis: Secondary | ICD-10-CM

## 2023-08-21 DIAGNOSIS — R2681 Unsteadiness on feet: Secondary | ICD-10-CM

## 2023-08-21 DIAGNOSIS — M6281 Muscle weakness (generalized): Secondary | ICD-10-CM

## 2023-08-21 DIAGNOSIS — R262 Difficulty in walking, not elsewhere classified: Secondary | ICD-10-CM

## 2023-08-21 NOTE — Therapy (Signed)
 OUTPATIENT OCCUPATIONAL THERAPY NEURO TREATMENT NOTE   Patient Name: Lisa Williams MRN: [id number] DOB:[birth year], 63 y.o., female Today's Date: 08/21/2023  PCP: Dr. Einar Crow   Neurologist Dr. Leilani Merl at [facility] REFERRING PROVIDER: Dr. Einar Crow    END OF SESSION:  OT End of Session - 08/21/23 1318     Visit Number 2    Number of Visits 24    Date for OT Re-Evaluation 11/08/23    Progress Note Due on Visit 10    OT Start Time 1145    OT Stop Time 1230    OT Time Calculation (min) 45 min    Equipment Utilized During Treatment manual wc    Activity Tolerance Patient tolerated treatment well    Behavior During Therapy [facility] for tasks assessed/performed            Past Medical History:  Diagnosis Date   Abdominal pain, right upper quadrant    Back pain    Calculus of kidney 12/09/2013   Chronic back pain    unspecified   Chronic left shoulder pain 07/19/2015   Complication of anesthesia    Functional disorder of bladder    other   Galactorrhea 11/26/2014   Chronic    Hereditary and idiopathic neuropathy 08/19/2013   History of kidney stones    HPV test positive    Hypercholesteremia 08/19/2013   Hypertension    Incomplete bladder emptying    Microscopic hematuria    MS (multiple sclerosis) (HCC)    Muscle spasticity 05/21/2014   Nonspecific findings on examination of urine    other   Osteopenia    PONV (postoperative nausea and vomiting)    Status post laparoscopic supracervical hysterectomy 11/26/2014   Tobacco user 11/26/2014   Wrist fracture    Past Surgical History:  Procedure Laterality Date   bilateral tubal ligation  1996   BREAST CYST EXCISION Left 2002   CYST EXCISION Left 05/10/2022   Procedure: CYST REMOVAL;  Surgeon: Carolan Shiver, MD;  Location: [facility] ORS;  Service: General;  Laterality: Left;   FRACTURE SURGERY     KNEE SURGERY     right   LAPAROSCOPIC SUPRACERVICAL HYSTERECTOMY  08/05/2013   ORIF WRIST FRACTURE  Left 01/17/2017   Procedure: OPEN REDUCTION INTERNAL FIXATION (ORIF) WRIST FRACTURE;  Surgeon: Lyndle Herrlich, MD;  Location: [facility] ORS;  Service: Orthopedics;  Laterality: Left;   RADIOLOGY WITH ANESTHESIA N/A 03/18/2020   Procedure: MRI WITH ANESTHESIA CERVICAL SPINE AND BRAIN  WITH AND WITHOUT CONTRAST;  Surgeon: Radiologist, Medication, MD;  Location: [facility] OR;  Service: Radiology;  Laterality: N/A;   TUBAL LIGATION Bilateral    VAGINAL HYSTERECTOMY  03/2006   Patient Active Problem List   Diagnosis Date Noted   Sinus tachycardia 07/07/2023   Hypokalemia 07/29/2022   Weakness of both lower extremities 07/29/2022   Abscess of left groin 11/15/2021   Abnormal LFTs 11/15/2021   Acute respiratory disease due to COVID-19 virus 11/21/2020   Weakness    Hypoalbuminemia due to protein-calorie malnutrition [facility])    Neurogenic bowel    Neurogenic bladder    Labile blood pressure    Neuropathic pain    Abscess of female pelvis    SVT (supraventricular tachycardia) (HCC)    Radial styloid tenosynovitis 03/12/2018   Wheelchair dependence 02/27/2018   Localized osteoporosis with current pathological fracture with routine healing 01/19/2017   Wrist fracture 01/16/2017   Sprain of ankle 03/23/2016   Closed fracture of lateral malleolus 03/16/2016  Health care maintenance 01/24/2016   Blood pressure elevated without history of HTN 10/25/2015   Essential hypertension 10/25/2015   Multiple sclerosis (HCC) 10/02/2015   Chronic left shoulder pain 07/19/2015   Multiple sclerosis exacerbation (HCC) 07/14/2015   MS (multiple sclerosis) (HCC) 11/26/2014   Increased body mass index 11/26/2014   HPV test positive 11/26/2014   Status post laparoscopic supracervical hysterectomy 11/26/2014   Galactorrhea 11/26/2014   Back ache 05/21/2014   Adiposity 05/21/2014   Disordered sleep 05/21/2014   Muscle spasticity 05/21/2014   Spasticity 05/21/2014   Calculus of kidney 12/09/2013   Renal colic  12/09/2013   Hypercholesteremia 08/19/2013   Hereditary and idiopathic neuropathy 08/19/2013   Hypercholesterolemia without hypertriglyceridemia 08/19/2013   Bladder infection, chronic 07/25/2012   Disorder of bladder function 07/25/2012   Incomplete bladder emptying 07/25/2012   Microscopic hematuria 07/25/2012   Right upper quadrant pain 07/25/2012   ONSET DATE: 5/24 Lisa Williams and broke R leg which caused beginning of ADL decline; diagnosed with MS in 1995)  REFERRING DIAG: MS  THERAPY DIAG:  Muscle weakness (generalized)  Multiple sclerosis exacerbation (HCC)  Rationale for Evaluation and Treatment: Rehabilitation  SUBJECTIVE:  SUBJECTIVE STATEMENT: Pt reports that she was able to take some pictures of her home bedroom and bathroom set up as requested by OT.  Pt accompanied by: self  PERTINENT HISTORY: Pt reports increased difficulty with basic self care tasks since breaking her R leg last May.  Since that time, mobility and ADLs have declined, with pt requiring assist from private caregivers and family to manage bathing, toileting, dressing, and functional transfers.  PRECAUTIONS: Fall  WEIGHT BEARING RESTRICTIONS: No  PAIN:  Are you having pain? No; occasional pain in R lower back, but not today  FALLS: Has patient fallen in last 6 months? No Last fall was May 17 of 2024  LIVING ENVIRONMENT: Lives with: lives with their family (including mother who has dementia and sister Lisa Williams)  Lives in: 2 level home but pt resides on main level Stairs: ramp  Has following equipment at home: Wheelchair (manual), Shower bench, and hand held shower shower, 1 grab bar in the shower,  3in1 commode, sit to stand lift (electric), FWW, hospital bed  PLOF: modified indep with ADL/IADLs prior to May of 1478   PATIENT GOALS: Increase independence with basic self care tasks  OBJECTIVE:  Note: Objective measures were completed at Evaluation unless otherwise noted.  HAND DOMINANCE:  Right  ADLs:  Overall ADLs: assist provided from paid caregiver and sister Transfers/ambulation related to ADLs: sit to stand lift for all transfers, set up for sliding board in/out of bed  Eating: indep  Grooming: modified indep (wc level in mom's bathroom)  UB Dressing: set up (can't access her bedroom closet)  LB Dressing: able to sit on side of bed to don all LB clothing; assist to hike pants/underwear Toileting: assist with clothing management d/t standing within electric lift Bathing: bed bath with sister helping to wash backside Tub Shower transfers: N/A; pt has walk in shower in mom's bathroom (wc does fit in this bathroom but pt reports inability to transfer from wc and requires arm rests on a bench for a successful transfer attempt from any DME).  Tub bench is in pt's bathroom, but lift does not fit through bathroom doorway and pt reports inability to transfer up from tub bench (currently unable to manage either transfer)   Equipment:  see above  IADLs: Shopping: [employer] for shopping Light housekeeping: modified indep  from wc level Meal Prep: modified indep from wc level Community mobility: relies on community transportation or family members Medication management: indep Landscape architect: indep Handwriting:  NT; pt denies any FMC challenges  MOBILITY STATUS: Hx of falls  POSTURE COMMENTS:  Rounded shoulders, forward head, anterior pelvic tilt, and weight shift left   ACTIVITY TOLERANCE: Activity tolerance: Per PT; currently standing 30-60 sec within Light Gait harness/lift; currently non-ambulatory  UPPER EXTREMITY ROM:  BUEs WFL  UPPER EXTREMITY MMT:     MMT Right eval Left eval  Shoulder flexion 4+ 4  Shoulder abduction 4+ 4  Shoulder adduction    Shoulder extension    Shoulder internal rotation 4+ 4+  Shoulder external rotation 4 4  Middle trapezius    Lower trapezius    Elbow flexion 4+ 4+  Elbow extension 4+ 4+  Wrist flexion 4+ 4+  Wrist  extension 4+ 4+  Wrist ulnar deviation    Wrist radial deviation    Wrist pronation    Wrist supination    (Blank rows = not tested)  HAND FUNCTION/COORDINATION:  R/L WNL; pt denies any coordination deficits/ able to manipulate pills/clothing fasteners/etc without difficulty  SENSATION: WFL  EDEMA: No visible edema in BUEs  MUSCLE TONE: BUEs WNL  COGNITION: Overall cognitive status: Within functional limits for tasks assessed  VISION: wears glasses all the time, no reports of diplopia  PERCEPTION: Not tested  PRAXIS: WFL  OBSERVATIONS:  Pt pleasant, cooperative, and motivated to work towards improving indep with ADLs.                                                                                                     TREATMENT DATE: 08/21/23 Self Care: -Reviewed home bathroom and bedroom set up and bathing/dressing routines after reviewing pt's photos from home.  Pt able to verbalize priorities with OT goals, with more independent toileting and sink bathing being first priorities over a shower.  Therapeutic Exercise: -Instructed in A/AAROM to promote increased flexibility with R/L shoulder IR to ease ability to perform peri care and bathing buttocks: towel stretch with min A; hand slides hip>low back>mid back>buttocks, min A.  Therapeutic Activity: -Practiced A/P weight shifting from wc in front of grab bar at toilet in prep for toilet transfer training.  Practiced bilat hands on grab bar, bilat hands pushing up from wc arm rests, and alternating R/L on grab bar with opposite arm pushing from wc. -OT provided blocking at the knees and max vc for technique, visual target for promoting anterior weight shift to clear buttocks from wc seat.  -Clothing management simulation in prep for toileting: Practiced lateral leaning in wc working to hike and lower theraband loop from knees to hips.  PATIENT EDUCATION: Education details: IR flexibility stretches to ease peri  care/bathing Person educated: Patient Education method: Explanation, demo Education comprehension: verbalized understanding, demonstrating understanding; further training needed  HOME EXERCISE PROGRAM: IR A/AAROM to promote shoulder flexibility for peri care/bathing  GOALS: Goals reviewed with patient? Yes  SHORT TERM GOALS: Target date: 09/27/23  Pt will perform bed bed bath with  set up.  Baseline: Eval: Min-mod A for posterior washing Goal status: INITIAL  2.  Pt will utilize sliding board for wc<>drop arm commode transfer with SBA. Baseline: Eval: Currently using electric lift for transfer to [facility] Goal status: INITIAL  3.  Pt will be indep to perform HEP for maintaining BUE strength for ADLs and functional transfers. Baseline: Eval: HEP not yet initiated Goal status: INITIAL  LONG TERM GOALS: Target date: 11/08/23  Pt will perform sink bath with min A. Baseline: Eval: Currently bed bathing d/t inability to stand at sink without electric lift (lift does not fit into pt's bathroom).   Goal status: INITIAL  2.  Pt will perform wc<>tub bench transfer with min A. Baseline: Eval: Unable Goal status: INITIAL  3.  Pt will perform squat pivot transfer wc<>BSC with SBA. Baseline: Eval: Eval: Currently using electric lift for transfer to [facility] Goal status: INITIAL  4. Pt will perform scoot transfer wc<>toilet using grab bars to allow toileting in community setting.  Baseline: Eval: Pt limits community outings d/t requiring use of lift for [facility] transfers.   Goal status: INITIAL  5.  Pt will tolerate standing x1 min to manage clothing in prep for toileting. Baseline: Eval: Currently standing in electric lift only, or within parallel bars without lift Goal status: INITIAL   ASSESSMENT:  CLINICAL IMPRESSION: Pt with good participation in above noted activities this date; reported fatigue following back to back PT/OT session but tolerable.  Completed initial trial for STS transfer in  bathroom to assess clearance of buttocks from wc using horizontal grab bar at toilet.  Pt unable to achieve any clearance pulling from grab bar with BUEs, but is able to fully clear buttocks when pushing self from arm rests of wc, though exhibits no anterior weight shift to prepare for standing, or pivoting, or reaching for a grab bar.  After high reps, OT blocking knees, and providing visual cues for forward weight shifting, and mod vc for technique, pt was able to relieve pressure from buttocks, achieving ~1" clearance of buttocks from wc when incorporating an anterior WS when using BUEs to push up from arm rests.  OT encouraged practicing A/P weight shifts in wc positioned in front of her bed at home to guard against falling.  Pt shows limited anterior weight shift initially d/t fear, but improved by end of session with repeat trials and therapist positioned in front of pt.  With simulation of hiking and lowering clothing while seated in wc, pt demos ability to WS laterally and hike a theraband from knees to R and L upper thighs with vc for technique.  Pt remains motivated and eager to maximize indep with ADLs as to reduce burden of care on caregivers.  Pt will benefit from skilled OT to work towards above noted goals in OT poc.  Pt verbalized taking a shower and focusing on shower/tub transfers would be a secondary goal at this point, noting her primary goals are to return to sink bathing in the bathroom from wc level and increasing independence with toilet transfers and clothing management.  PERFORMANCE DEFICITS: in functional skills including ADLs, IADLs, strength, pain, flexibility, Gross motor control, mobility, balance, body mechanics, endurance, and decreased knowledge of use of DME, and psychosocial skills including coping strategies, environmental adaptation, habits, and routines and behaviors.   IMPAIRMENTS: are limiting patient from ADLs, IADLs, and social participation.   CO-MORBIDITIES: has  co-morbidities such as neuropathy, back pain, obesity, MS, HTN  that affects occupational performance.  Patient will benefit from skilled OT to address above impairments and improve overall function.  MODIFICATION OR ASSISTANCE TO COMPLETE EVALUATION: No modification of tasks or assist necessary to complete an evaluation.  OT OCCUPATIONAL PROFILE AND HISTORY: Detailed assessment: Review of records and additional review of physical, cognitive, psychosocial history related to current functional performance.  CLINICAL DECISION MAKING: Moderate - several treatment options, min-mod task modification necessary  REHAB POTENTIAL: Good  EVALUATION COMPLEXITY: Moderate    PLAN:  OT FREQUENCY: 2x/week  OT DURATION: 12 weeks  PLANNED INTERVENTIONS: 97168 OT Re-evaluation, 97535 self care/ADL training, 16109 therapeutic exercise, 97530 therapeutic activity, 97112 neuromuscular re-education, 97140 manual therapy, 97116 gait training, 60454 moist heat, 97010 cryotherapy, balance training, functional mobility training, psychosocial skills training, energy conservation, coping strategies training, patient/family education, and DME and/or AE instructions  RECOMMENDED OTHER SERVICES: None at this time (Pt currently receiving PT services in this clinic)  CONSULTED AND AGREED WITH PLAN OF CARE: Patient  PLAN FOR NEXT SESSION: see above  Marcus Sewer, MS, OTR/L   Casandra Claw, OT 08/21/2023, 1:21 PM

## 2023-08-23 ENCOUNTER — Ambulatory Visit

## 2023-08-23 ENCOUNTER — Ambulatory Visit: Payer: PPO

## 2023-08-27 NOTE — Therapy (Signed)
 OUTPATIENT PHYSICAL THERAPY NEURO TREATMENT     Patient Name: Lisa Williams MRN: [id number] DOB:1960/11/14, 63 y.o., female Today's Date: 08/28/2023   PCP: Overton Blotter  REFERRING PROVIDER: Overton Blotter  END OF SESSION:  PT End of Session - 08/28/23 1055     Visit Number 13    Number of Visits 24    Date for PT Re-Evaluation 10/04/23    PT Start Time 1057    PT Stop Time 1140    PT Time Calculation (min) 43 min    Equipment Utilized During Treatment Gait belt    Activity Tolerance Patient tolerated treatment well;No increased pain    Behavior During Therapy [facility] for tasks assessed/performed                       Past Medical History:  Diagnosis Date   Abdominal pain, right upper quadrant    Back pain    Calculus of kidney 12/09/2013   Chronic back pain    unspecified   Chronic left shoulder pain 07/19/2015   Complication of anesthesia    Functional disorder of bladder    other   Galactorrhea 11/26/2014   Chronic    Hereditary and idiopathic neuropathy 08/19/2013   History of kidney stones    HPV test positive    Hypercholesteremia 08/19/2013   Hypertension    Incomplete bladder emptying    Microscopic hematuria    MS (multiple sclerosis) (HCC)    Muscle spasticity 05/21/2014   Nonspecific findings on examination of urine    other   Osteopenia    PONV (postoperative nausea and vomiting)    Status post laparoscopic supracervical hysterectomy 11/26/2014   Tobacco user 11/26/2014   Wrist fracture    Past Surgical History:  Procedure Laterality Date   bilateral tubal ligation  1996   BREAST CYST EXCISION Left 2002   CYST EXCISION Left 05/10/2022   Procedure: CYST REMOVAL;  Surgeon: Eldred Grego, MD;  Location: [facility] ORS;  Service: General;  Laterality: Left;   FRACTURE SURGERY     KNEE SURGERY     right   LAPAROSCOPIC SUPRACERVICAL HYSTERECTOMY  08/05/2013   ORIF WRIST FRACTURE Left 01/17/2017   Procedure: OPEN  REDUCTION INTERNAL FIXATION (ORIF) WRIST FRACTURE;  Surgeon: Jerlyn Moons, MD;  Location: [facility] ORS;  Service: Orthopedics;  Laterality: Left;   RADIOLOGY WITH ANESTHESIA N/A 03/18/2020   Procedure: MRI WITH ANESTHESIA CERVICAL SPINE AND BRAIN  WITH AND WITHOUT CONTRAST;  Surgeon: Radiologist, Medication, MD;  Location: [facility] OR;  Service: Radiology;  Laterality: N/A;   TUBAL LIGATION Bilateral    VAGINAL HYSTERECTOMY  03/2006   Patient Active Problem List   Diagnosis Date Noted   Sinus tachycardia 07/07/2023   Hypokalemia 07/29/2022   Weakness of both lower extremities 07/29/2022   Abscess of left groin 11/15/2021   Abnormal LFTs 11/15/2021   Acute respiratory disease due to COVID-19 virus 11/21/2020   Weakness    Hypoalbuminemia due to protein-calorie malnutrition [facility])    Neurogenic bowel    Neurogenic bladder    Labile blood pressure    Neuropathic pain    Abscess of female pelvis    SVT (supraventricular tachycardia) (HCC)    Radial styloid tenosynovitis 03/12/2018   Wheelchair dependence 02/27/2018   Localized osteoporosis with current pathological fracture with routine healing 01/19/2017   Wrist fracture 01/16/2017   Sprain of ankle 03/23/2016   Closed fracture of lateral malleolus 03/16/2016   Health care maintenance 01/24/2016  Blood pressure elevated without history of HTN 10/25/2015   Essential hypertension 10/25/2015   Multiple sclerosis (HCC) 10/02/2015   Chronic left shoulder pain 07/19/2015   Multiple sclerosis exacerbation (HCC) 07/14/2015   MS (multiple sclerosis) (HCC) 11/26/2014   Increased body mass index 11/26/2014   HPV test positive 11/26/2014   Status post laparoscopic supracervical hysterectomy 11/26/2014   Galactorrhea 11/26/2014   Back ache 05/21/2014   Adiposity 05/21/2014   Disordered sleep 05/21/2014   Muscle spasticity 05/21/2014   Spasticity 05/21/2014   Calculus of kidney 12/09/2013   Renal colic 12/09/2013   Hypercholesteremia 08/19/2013    Hereditary and idiopathic neuropathy 08/19/2013   Hypercholesterolemia without hypertriglyceridemia 08/19/2013   Bladder infection, chronic 07/25/2012   Disorder of bladder function 07/25/2012   Incomplete bladder emptying 07/25/2012   Microscopic hematuria 07/25/2012   Right upper quadrant pain 07/25/2012    ONSET DATE: 1995  REFERRING DIAG: MS  THERAPY DIAG:  Muscle weakness (generalized)  Multiple sclerosis exacerbation (HCC)  Difficulty in walking, not elsewhere classified  Unsteadiness on feet  Rationale for Evaluation and Treatment: Rehabilitation  SUBJECTIVE:                                                                                                                                                                                             SUBJECTIVE STATEMENT: Patient is fatigued due to being able to go to church for first time in a year. Also celebrated her sisters bday.  Pt accompanied by: self  PERTINENT HISTORY:  Patient is returning to PT s/p hospitalization.  s/p ORIF of R tibia shaft fracture 09/23/2022. Patient has weakness in BLE with RLE>LLE. She drives with hand controls. Patient has been diagnosed with MS in 1995. PMH includes: back pain, CBP, chronic L shoulder pain, galactorrhea, neuropathy, HPV, hypercholestermeia, HTN, MS, osteopenia, PONV, wrist fracture. Additional order for other closed fracture of proximal end of R tibia with routine healing. Still has to use [employer].   PAIN:  Are you having pain?  Occasional pain in RLE; primarily in knee  PRECAUTIONS: Fall  RED FLAGS: None   WEIGHT BEARING RESTRICTIONS: No  FALLS: Has patient fallen in last 6 months? No  LIVING ENVIRONMENT: Lives with: lives with their family Lives in: House/apartment Stairs:  ramp Has following equipment at home: Otho Blitz - 2 wheeled, Wheelchair (manual), shower chair, and Grab bars  PLOF: Independent with household mobility with device  PATIENT GOALS: to get  her independence back. To be able to get into car, toilet, and get dressed independently  OBJECTIVE:  Note: Objective measures were completed at Evaluation unless otherwise noted.  DIAGNOSTIC FINDINGS: MRI of the brain 03/18/2020 showed multiple T2/FLAIR hyperintense foci in the periventricular, juxtacortical and deep white matter.  There were no infratentorial lesions noted.  None of the foci enhanced.   Due to severe claustrophobia, the study was done with conscious sedation in the hospital   COGNITION: Overall cognitive status: Within functional limits for tasks assessed   SENSATION: Lack of sensation in feet, loss in bilateral lateral aspect of knee  COORDINATION: Does not have the strength for functional LE heel slide test     MUSCLE TONE: BLE mild tone    POSTURE: rounded shoulders, forward head, anterior pelvic tilt, and weight shift left    LOWER EXTREMITY MMT:    MMT Right Eval Left Eval  Hip flexion 0.9 1.5  Hip extension    Hip abduction 1.8 2.6  Hip adduction 3.1 1.9  Hip internal rotation    Hip external rotation    Knee flexion 0.9 1.5  Knee extension 0.8 1.2  Ankle dorsiflexion    Ankle plantarflexion    Ankle inversion    Ankle eversion    (Blank rows = not tested)  BED MOBILITY:  Assess in future session due to limited time  TRANSFERS: Assistive device utilized:  Bariatric RW with sit to stands, slide board to table   Sit to stand:  unable to reach full stand Stand to sit:  unable to reach full stand  Chair to chair:  slide board with CGA     GAIT: Unable to ambulate at this time.   FUNCTIONAL TESTS:  Sit to stand: tricep press with BUE; unable to bring arm to walker.    Function In Sitting Test (FIST)  (1/2 femur on surface; hips/knees flexed to 90deg)   - indicate bed or mat table / step stool if used  SCORING KEY: 4 = Independent (completes task independently & successfully) 3 = Verbal Cues/Increased Time (completes task  independently & successfully and only needs more time/cues) 2 = Upper Extremity Support (must use UE for support or assistance to complete successfully) 1 = Needs Assistance (unable to complete w/o physical assist; DOCUMENT LEVEL: min, mod, max) 0 = Dependent (requires complete physical assist; unable to complete successfully even w/ physical assist)  Randomly Administer Once Throughout Exam  4 - Anterior Nudge (superior sternum)  4 - Posterior Nudge (between scapular spines)  4 - Lateral Nudge (to dominant side at acromion)     4 - Static sitting (30 seconds)  4 - Sitting, shake 'no' (left and right)  4 - Sitting, eyes closed (30 seconds)   0 - Sitting, lift foot (dominant side, lift foot 1 inch twice)    2 - Pick up object from behind (object at midline, hands breadth posterior)  3 - Forward reach (use dominant arm, must complete full motion) 2 - Lateral reach (use dominant arm, clear opposite ischial tuberosity) 2 - Pick up object from floor (from between feet)   2 - Posterior scooting (move backwards 2 inches)  2 - Anterior scooting (move forward 2 inches)  2 - Lateral scooting (move to dominant side 2 inches)    TOTAL = 39/56  Notes/comments: slide board tranfer to/from table   MCD > 5 points MCID for IP REHAB > 6 points  Function In Sitting Test (FIST) 08/14/23 (1/2 femur on surface; hips/knees flexed to 90deg)   - indicate bed or mat table / step stool if used  SCORING KEY: 4 = Independent (completes task independently & successfully) 3 =  Verbal Cues/Increased Time (completes task independently & successfully and only needs more time/cues) 2 = Upper Extremity Support (must use UE for support or assistance to complete successfully) 1 = Needs Assistance (unable to complete w/o physical assist; DOCUMENT LEVEL: min, mod, max) 0 = Dependent (requires complete physical assist; unable to complete successfully even w/ physical assist)  Randomly Administer Once Throughout Exam   4 - Anterior Nudge (superior sternum)  4 - Posterior Nudge (between scapular spines)  4 - Lateral Nudge (to dominant side at acromion)     4 - Static sitting (30 seconds)  4 - Sitting, shake 'no' (left and right)  4 - Sitting, eyes closed (30 seconds)   0 - Sitting, lift foot (dominant side, lift foot 1 inch twice)    4 - Pick up object from behind (object at midline, hands breadth posterior)  4 - Forward reach (use dominant arm, must complete full motion) 4 - Lateral reach (use dominant arm, clear opposite ischial tuberosity) 2 - Pick up object from floor (from between feet)   3 - Posterior scooting (move backwards 2 inches)  3 - Anterior scooting (move forward 2 inches)  3 - Lateral scooting (move to dominant side 2 inches)    TOTAL = 47/56    MCD > 5 points MCID for IP REHAB > 6 points                                                                                                                              TREATMENT DATE: 08/28/23 TherAct:  All activities performed with gait belt and CGA unless otherwise noted.   In // bars: Sit to stand; ambulate length of // bars with assistance wheelchair follow and cues for R weight shift to clear L foot due to limited acceptance of weight on RLE;unable to make full length due to fatigue; stopped after three steps  Partial sit to stand with focus on LE only 5x; ; 2 sets  Therex Yellow dynadisc  -df 10x, pf 10x each LE Seated on dynadisc: -static position 60 seconds -medial/lateral 10x each direction -forward/backward 10x each direction -hand clap for pertubation  Hamstring stretch 60 seconds each LE Adductor stretch 60 seconds each LE      PATIENT EDUCATION: Education details: goals, POC, HEP  Person educated: Patient Education method: Explanation, Demonstration, Tactile cues, Verbal cues, and Handouts Education comprehension: verbalized understanding, returned demonstration, verbal cues required, tactile cues required,  and needs further education  HOME EXERCISE PROGRAM: Stand 3x/day in stander   GOALS: Goals reviewed with patient? Yes  SHORT TERM GOALS: Target date: 08/08/2023    Patient will be independent in home exercise program to improve strength/mobility for better functional independence with ADLs.  Baseline: 4/8: compliance Goal status: MET    LONG TERM GOALS: Target date: 10/04/2023    Patient will tolerate five consecutive stands with UE support from wheelchair to standing to improve functional mobility.  Baseline: unable to  perform 4/8:unable to perform full stand Goal status: Ongoing   2.  Patient will ambulate 10 ft with bRW with wheelchair follow.  Baseline:  unable to ambulate 4/8: unable to ambulate Goal status: partially met  3.  Patient will improve FIST score >6 points to demonstrate improved stability and ability to perform ADLs.  Baseline:  3/6: 39/56 4/8: 47/ 56  Goal status: MET  4.  Patient will perform toileting with mod I at home for improved independence.  Baseline: 3/6: requires assistance 4/8: requires dependence on machine Goal status: Partially Met    ASSESSMENT:  CLINICAL IMPRESSION: Patient session required multiple rest breaks due to fatigue from busy weekend. Patient remains highly motivated throughout session despite her fatigue. She is unable to ambulate full length of // bars due to limited strength. Patient will benefit from skilled physical therapy to improve mobility, stability, and independence.   OBJECTIVE IMPAIRMENTS: Abnormal gait, cardiopulmonary status limiting activity, decreased activity tolerance, decreased balance, decreased coordination, decreased endurance, decreased mobility, difficulty walking, decreased ROM, decreased strength, hypomobility, increased fascial restrictions, impaired perceived functional ability, impaired flexibility, impaired sensation, improper body mechanics, and postural dysfunction.   ACTIVITY LIMITATIONS:  carrying, lifting, bending, sitting, standing, squatting, sleeping, stairs, transfers, bed mobility, continence, bathing, toileting, dressing, self feeding, reach over head, hygiene/grooming, locomotion level, and caring for others  PARTICIPATION LIMITATIONS: meal prep, cleaning, laundry, interpersonal relationship, driving, shopping, community activity, and church  PERSONAL FACTORS: Age, Past/current experiences, Time since onset of injury/illness/exacerbation, Transportation, and 3+ comorbidities: ack pain, CBP, chronic L shoulder pain, galactorrhea, neuropathy, HPV, hypercholestermeia, HTN, MS, osteopenia, PONV, wrist fracture  are also affecting patient's functional outcome.   REHAB POTENTIAL: Good  CLINICAL DECISION MAKING: Evolving/moderate complexity  EVALUATION COMPLEXITY: Moderate  PLAN:  PT FREQUENCY: 2x/week  PT DURATION: 12 weeks  PLANNED INTERVENTIONS: 97164- PT Re-evaluation, 97110-Therapeutic exercises, 97530- Therapeutic activity, 97112- Neuromuscular re-education, 97535- Self Care, 16109- Manual therapy, [phone]- Gait training, [phone]- Orthotic Fit/training, [phone]- Canalith repositioning, [id number]- Aquatic Therapy, [phone]- Electrical stimulation (unattended), [phone]- Electrical stimulation (manual), [id number]- Ultrasound, [id number]- Traction (mechanical), Patient/Family education, Balance training, Stair training, Taping, Dry Needling, Joint mobilization, Joint manipulation, Spinal manipulation, Spinal mobilization, Scar mobilization, Compression bandaging, Vestibular training, Visual/preceptual remediation/compensation, Cognitive remediation, DME instructions, Cryotherapy, Moist heat, and Biofeedback  PLAN FOR NEXT SESSION: sit to stands; try supported walking    Jynesis Nakamura, PT 08/28/2023, 11:45 AM

## 2023-08-28 ENCOUNTER — Ambulatory Visit: Payer: PPO

## 2023-08-28 ENCOUNTER — Ambulatory Visit

## 2023-08-28 DIAGNOSIS — M6281 Muscle weakness (generalized): Secondary | ICD-10-CM | POA: Diagnosis not present

## 2023-08-28 DIAGNOSIS — M810 Age-related osteoporosis without current pathological fracture: Secondary | ICD-10-CM | POA: Diagnosis not present

## 2023-08-28 DIAGNOSIS — R262 Difficulty in walking, not elsewhere classified: Secondary | ICD-10-CM

## 2023-08-28 DIAGNOSIS — G35 Multiple sclerosis: Secondary | ICD-10-CM

## 2023-08-28 DIAGNOSIS — R2681 Unsteadiness on feet: Secondary | ICD-10-CM

## 2023-08-28 NOTE — Therapy (Unsigned)
 OUTPATIENT OCCUPATIONAL THERAPY NEURO TREATMENT NOTE   Patient Name: Lisa Williams MRN: [id number] DOB:05/01/1961, 63 y.o., female Today's Date: 08/30/2023  PCP: Dr. Overton Blotter   Neurologist Dr. Octavio Ben at [facility] REFERRING PROVIDER: Dr. Overton Blotter    END OF SESSION:  OT End of Session - 08/30/23 0922     Visit Number 3    Number of Visits 24    Date for OT Re-Evaluation 11/08/23    Progress Note Due on Visit 10    OT Start Time 1150    OT Stop Time 1230    OT Time Calculation (min) 40 min    Equipment Utilized During Treatment manual wc    Activity Tolerance Patient tolerated treatment well    Behavior During Therapy [facility] for tasks assessed/performed             Past Medical History:  Diagnosis Date   Abdominal pain, right upper quadrant    Back pain    Calculus of kidney 12/09/2013   Chronic back pain    unspecified   Chronic left shoulder pain 07/19/2015   Complication of anesthesia    Functional disorder of bladder    other   Galactorrhea 11/26/2014   Chronic    Hereditary and idiopathic neuropathy 08/19/2013   History of kidney stones    HPV test positive    Hypercholesteremia 08/19/2013   Hypertension    Incomplete bladder emptying    Microscopic hematuria    MS (multiple sclerosis) (HCC)    Muscle spasticity 05/21/2014   Nonspecific findings on examination of urine    other   Osteopenia    PONV (postoperative nausea and vomiting)    Status post laparoscopic supracervical hysterectomy 11/26/2014   Tobacco user 11/26/2014   Wrist fracture    Past Surgical History:  Procedure Laterality Date   bilateral tubal ligation  1996   BREAST CYST EXCISION Left 2002   CYST EXCISION Left 05/10/2022   Procedure: CYST REMOVAL;  Surgeon: Eldred Grego, MD;  Location: [facility] ORS;  Service: General;  Laterality: Left;   FRACTURE SURGERY     KNEE SURGERY     right   LAPAROSCOPIC SUPRACERVICAL HYSTERECTOMY  08/05/2013   ORIF WRIST FRACTURE  Left 01/17/2017   Procedure: OPEN REDUCTION INTERNAL FIXATION (ORIF) WRIST FRACTURE;  Surgeon: Jerlyn Moons, MD;  Location: [facility] ORS;  Service: Orthopedics;  Laterality: Left;   RADIOLOGY WITH ANESTHESIA N/A 03/18/2020   Procedure: MRI WITH ANESTHESIA CERVICAL SPINE AND BRAIN  WITH AND WITHOUT CONTRAST;  Surgeon: Radiologist, Medication, MD;  Location: [facility] OR;  Service: Radiology;  Laterality: N/A;   TUBAL LIGATION Bilateral    VAGINAL HYSTERECTOMY  03/2006   Patient Active Problem List   Diagnosis Date Noted   Sinus tachycardia 07/07/2023   Hypokalemia 07/29/2022   Weakness of both lower extremities 07/29/2022   Abscess of left groin 11/15/2021   Abnormal LFTs 11/15/2021   Acute respiratory disease due to COVID-19 virus 11/21/2020   Weakness    Hypoalbuminemia due to protein-calorie malnutrition [facility])    Neurogenic bowel    Neurogenic bladder    Labile blood pressure    Neuropathic pain    Abscess of female pelvis    SVT (supraventricular tachycardia) (HCC)    Radial styloid tenosynovitis 03/12/2018   Wheelchair dependence 02/27/2018   Localized osteoporosis with current pathological fracture with routine healing 01/19/2017   Wrist fracture 01/16/2017   Sprain of ankle 03/23/2016   Closed fracture of lateral malleolus 03/16/2016  Health care maintenance 01/24/2016   Blood pressure elevated without history of HTN 10/25/2015   Essential hypertension 10/25/2015   Multiple sclerosis (HCC) 10/02/2015   Chronic left shoulder pain 07/19/2015   Multiple sclerosis exacerbation (HCC) 07/14/2015   MS (multiple sclerosis) (HCC) 11/26/2014   Increased body mass index 11/26/2014   HPV test positive 11/26/2014   Status post laparoscopic supracervical hysterectomy 11/26/2014   Galactorrhea 11/26/2014   Back ache 05/21/2014   Adiposity 05/21/2014   Disordered sleep 05/21/2014   Muscle spasticity 05/21/2014   Spasticity 05/21/2014   Calculus of kidney 12/09/2013   Renal colic  12/09/2013   Hypercholesteremia 08/19/2013   Hereditary and idiopathic neuropathy 08/19/2013   Hypercholesterolemia without hypertriglyceridemia 08/19/2013   Bladder infection, chronic 07/25/2012   Disorder of bladder function 07/25/2012   Incomplete bladder emptying 07/25/2012   Microscopic hematuria 07/25/2012   Right upper quadrant pain 07/25/2012   ONSET DATE: 5/24 [name] and broke R leg which caused beginning of ADL decline; diagnosed with MS in 1995)  REFERRING DIAG: MS  THERAPY DIAG:  Muscle weakness (generalized)  Multiple sclerosis exacerbation (HCC)  Rationale for Evaluation and Treatment: Rehabilitation  SUBJECTIVE:  SUBJECTIVE STATEMENT: Pt reports having a busy weekend with family and also feeling quite tired today. Pt accompanied by: self  PERTINENT HISTORY: Pt reports increased difficulty with basic self care tasks since breaking her R leg last May.  Since that time, mobility and ADLs have declined, with pt requiring assist from private caregivers and family to manage bathing, toileting, dressing, and functional transfers.  PRECAUTIONS: Fall  WEIGHT BEARING RESTRICTIONS: No  PAIN:  Are you having pain? No; occasional pain in R lower back, but not today  FALLS: Has patient fallen in last 6 months? No Last fall was May 17 of 2024  LIVING ENVIRONMENT: Lives with: lives with their family (including mother who has dementia and sister [name])  Lives in: 2 level home but pt resides on main level Stairs: ramp  Has following equipment at home: Wheelchair (manual), Shower bench, and hand held shower shower, 1 grab bar in the shower,  3in1 commode, sit to stand lift (electric), FWW, hospital bed  PLOF: modified indep with ADL/IADLs prior to May of 1191   PATIENT GOALS: Increase independence with basic self care tasks  OBJECTIVE:  Note: Objective measures were completed at Evaluation unless otherwise noted.  HAND DOMINANCE: Right  ADLs:  Overall ADLs: assist  provided from paid caregiver and sister Transfers/ambulation related to ADLs: sit to stand lift for all transfers, set up for sliding board in/out of bed  Eating: indep  Grooming: modified indep (wc level in mom's bathroom)  UB Dressing: set up (can't access her bedroom closet)  LB Dressing: able to sit on side of bed to don all LB clothing; assist to hike pants/underwear Toileting: assist with clothing management d/t standing within electric lift Bathing: bed bath with sister helping to wash backside Tub Shower transfers: N/A; pt has walk in shower in mom's bathroom (wc does fit in this bathroom but pt reports inability to transfer from wc and requires arm rests on a bench for a successful transfer attempt from any DME).  Tub bench is in pt's bathroom, but lift does not fit through bathroom doorway and pt reports inability to transfer up from tub bench (currently unable to manage either transfer)   Equipment:  see above  IADLs: Shopping: [employer] for shopping Light housekeeping: modified indep from wc level Meal Prep: modified indep from wc  level Community mobility: relies on community transportation or family members Medication management: indep Landscape architect: indep Handwriting:  NT; pt denies any FMC challenges  MOBILITY STATUS: Hx of falls  POSTURE COMMENTS:  Rounded shoulders, forward head, anterior pelvic tilt, and weight shift left   ACTIVITY TOLERANCE: Activity tolerance: Per PT; currently standing 30-60 sec within Light Gait harness/lift; currently non-ambulatory  UPPER EXTREMITY ROM:  BUEs WFL  UPPER EXTREMITY MMT:     MMT Right eval Left eval  Shoulder flexion 4+ 4  Shoulder abduction 4+ 4  Shoulder adduction    Shoulder extension    Shoulder internal rotation 4+ 4+  Shoulder external rotation 4 4  Middle trapezius    Lower trapezius    Elbow flexion 4+ 4+  Elbow extension 4+ 4+  Wrist flexion 4+ 4+  Wrist extension 4+ 4+  Wrist ulnar deviation     Wrist radial deviation    Wrist pronation    Wrist supination    (Blank rows = not tested)  HAND FUNCTION/COORDINATION:  R/L WNL; pt denies any coordination deficits/ able to manipulate pills/clothing fasteners/etc without difficulty  SENSATION: WFL  EDEMA: No visible edema in BUEs  MUSCLE TONE: BUEs WNL  COGNITION: Overall cognitive status: Within functional limits for tasks assessed  VISION: wears glasses all the time, no reports of diplopia  PERCEPTION: Not tested  PRAXIS: WFL  OBSERVATIONS:  Pt pleasant, cooperative, and motivated to work towards improving indep with ADLs.                                                                                                     TREATMENT DATE: 08/28/23 Therapeutic Activity: -Trials of A/P weight shifting sitting edge of mat table with wc in front for guarding, progressing to clearance of buttocks from mat in prep for sit to stand transfers and clothing management during toileting tasks (Achieved up to 4.5" clearance of buttocks from mat) -Trials of lateral leaning R/L sitting EOB while reaching posteriorly to buttocks to simulate peri care.  Self Care: -Introduced 2 types of toilet wands/bottom buddies and advised on options to obtain d/t limited reach posteriorly for peri care. -Practiced simulated trials of both wands with lateral leans on edge of mat table -Reviewed toileting routines when using wand at home or in community to promote optimal hygiene when using this device  -Practiced trials of hiking/lowering pants using gait belt to simulate waist of pants; pt completed hiking/lowering belt waist<>thighs x3 trials with supv only using lateral leaning on edge of mat table -Sliding board transfer wc<>mat table with set up/removal of board beneath buttocks  PATIENT EDUCATION: Education details: toileting aids Person educated: Patient Education method: Explanation, demo Education comprehension: verbalized understanding,  demonstrating understanding; further training needed  HOME EXERCISE PROGRAM: IR A/AAROM to promote shoulder flexibility for peri care/bathing  GOALS: Goals reviewed with patient? Yes  SHORT TERM GOALS: Target date: 09/27/23  Pt will perform bed bed bath with set up.  Baseline: Eval: Min-mod A for posterior washing Goal status: INITIAL  2.  Pt will utilize sliding board for wc<>drop arm  commode transfer with SBA. Baseline: Eval: Currently using electric lift for transfer to [facility] Goal status: INITIAL  3.  Pt will be indep to perform HEP for maintaining BUE strength for ADLs and functional transfers. Baseline: Eval: HEP not yet initiated Goal status: INITIAL  LONG TERM GOALS: Target date: 11/08/23  Pt will perform sink bath with min A. Baseline: Eval: Currently bed bathing d/t inability to stand at sink without electric lift (lift does not fit into pt's bathroom).   Goal status: INITIAL  2.  Pt will perform wc<>tub bench transfer with min A. Baseline: Eval: Unable Goal status: INITIAL  3.  Pt will perform squat pivot transfer wc<>BSC with SBA. Baseline: Eval: Eval: Currently using electric lift for transfer to [facility] Goal status: INITIAL  4. Pt will perform scoot transfer wc<>toilet using grab bars to allow toileting in community setting.  Baseline: Eval: Pt limits community outings d/t requiring use of lift for [facility] transfers.   Goal status: INITIAL  5.  Pt will tolerate standing x1 min to manage clothing in prep for toileting. Baseline: Eval: Currently standing in electric lift only, or within parallel bars without lift Goal status: INITIAL   ASSESSMENT:  CLINICAL IMPRESSION: Pt reported having a busy weekend and feeling tired today, but overall excellent participation and good tolerance to above noted activities with frequent rest periods built in to all activities noted above.  Pt verbalized fatigue with activities on edge of mat which challenged her core, but tolerated well  with rest periods.  Pt making steady improvements with anterior WS in prep for sit to stand transfers, achieving up to 4.5" clearance of buttocks from mat today with vc only and OT helping to blocks knees with wc in front of her.  Pt found toilet wand effective and plans to order.  Pt will continue to benefit from skilled OT to work towards above noted goals in OT poc, working to maximize indep with daily tasks while reducing burden of care on caregivers.   PERFORMANCE DEFICITS: in functional skills including ADLs, IADLs, strength, pain, flexibility, Gross motor control, mobility, balance, body mechanics, endurance, and decreased knowledge of use of DME, and psychosocial skills including coping strategies, environmental adaptation, habits, and routines and behaviors.   IMPAIRMENTS: are limiting patient from ADLs, IADLs, and social participation.   CO-MORBIDITIES: has co-morbidities such as neuropathy, back pain, obesity, MS, HTN  that affects occupational performance. Patient will benefit from skilled OT to address above impairments and improve overall function.  MODIFICATION OR ASSISTANCE TO COMPLETE EVALUATION: No modification of tasks or assist necessary to complete an evaluation.  OT OCCUPATIONAL PROFILE AND HISTORY: Detailed assessment: Review of records and additional review of physical, cognitive, psychosocial history related to current functional performance.  CLINICAL DECISION MAKING: Moderate - several treatment options, min-mod task modification necessary  REHAB POTENTIAL: Good  EVALUATION COMPLEXITY: Moderate    PLAN:  OT FREQUENCY: 2x/week  OT DURATION: 12 weeks  PLANNED INTERVENTIONS: 97168 OT Re-evaluation, 97535 self care/ADL training, 27253 therapeutic exercise, 97530 therapeutic activity, 97112 neuromuscular re-education, 97140 manual therapy, 97116 gait training, 66440 moist heat, 97010 cryotherapy, balance training, functional mobility training, psychosocial skills  training, energy conservation, coping strategies training, patient/family education, and DME and/or AE instructions  RECOMMENDED OTHER SERVICES: None at this time (Pt currently receiving PT services in this clinic)  CONSULTED AND AGREED WITH PLAN OF CARE: Patient  PLAN FOR NEXT SESSION: see above  Marcus Sewer, MS, OTR/L   Casandra Claw, OT 08/30/2023, 9:23 AM

## 2023-08-29 NOTE — Therapy (Signed)
 OUTPATIENT PHYSICAL THERAPY NEURO TREATMENT     Patient Name: Lisa Williams MRN: [id number] DOB:Sep 16, 1960, 63 y.o., female Today's Date: 08/30/2023   PCP: Overton Blotter  REFERRING PROVIDER: Overton Blotter  END OF SESSION:  PT End of Session - 08/30/23 1009     Visit Number 14    Number of Visits 24    Date for PT Re-Evaluation 10/04/23    PT Start Time 1102    PT Stop Time 1132    PT Time Calculation (min) 30 min    Equipment Utilized During Treatment Gait belt    Activity Tolerance Patient tolerated treatment well;No increased pain    Behavior During Therapy [facility] for tasks assessed/performed                        Past Medical History:  Diagnosis Date   Abdominal pain, right upper quadrant    Back pain    Calculus of kidney 12/09/2013   Chronic back pain    unspecified   Chronic left shoulder pain 07/19/2015   Complication of anesthesia    Functional disorder of bladder    other   Galactorrhea 11/26/2014   Chronic    Hereditary and idiopathic neuropathy 08/19/2013   History of kidney stones    HPV test positive    Hypercholesteremia 08/19/2013   Hypertension    Incomplete bladder emptying    Microscopic hematuria    MS (multiple sclerosis) (HCC)    Muscle spasticity 05/21/2014   Nonspecific findings on examination of urine    other   Osteopenia    PONV (postoperative nausea and vomiting)    Status post laparoscopic supracervical hysterectomy 11/26/2014   Tobacco user 11/26/2014   Wrist fracture    Past Surgical History:  Procedure Laterality Date   bilateral tubal ligation  1996   BREAST CYST EXCISION Left 2002   CYST EXCISION Left 05/10/2022   Procedure: CYST REMOVAL;  Surgeon: Eldred Grego, MD;  Location: [facility] ORS;  Service: General;  Laterality: Left;   FRACTURE SURGERY     KNEE SURGERY     right   LAPAROSCOPIC SUPRACERVICAL HYSTERECTOMY  08/05/2013   ORIF WRIST FRACTURE Left 01/17/2017   Procedure: OPEN  REDUCTION INTERNAL FIXATION (ORIF) WRIST FRACTURE;  Surgeon: Jerlyn Moons, MD;  Location: [facility] OR[facility];  Service: Orthopedics;  Laterality: Left;   RADIOLOGY WITH ANESTHESIA N/A 03/18/2020   Procedure: MRI WITH ANESTHESIA CERVICAL SPINE AND BRAIN  WITH AND WITHOUT CONTRAST;  Surgeon: Radiologist, Medication, MD;  Location: [facility] OR;  Service: Radiology;  Laterality: N/A;   TUBAL LIGATION Bilateral    VAGINAL HYSTERECTOMY  03/2006   Patient Active Problem List   Diagnosis Date Noted   Sinus tachycardia 07/07/2023   Hypokalemia 07/29/2022   Weakness of both lower extremities 07/29/2022   Abscess of left groin 11/15/2021   Abnormal LFTs 11/15/2021   Acute respiratory disease due to COVID-19 virus 11/21/2020   Weakness    Hypoalbuminemia due to protein-calorie malnutrition [facility])    Neurogenic bowel    Neurogenic bladder    Labile blood pressure    Neuropathic pain    Abscess of female pelvis    SVT (supraventricular tachycardia) (HCC)    Radial styloid tenosynovitis 03/12/2018   Wheelchair dependence 02/27/2018   Localized osteoporosis with current pathological fracture with routine healing 01/19/2017   Wrist fracture 01/16/2017   Sprain of ankle 03/23/2016   Closed fracture of lateral malleolus 03/16/2016   Health care maintenance  01/24/2016   Blood pressure elevated without history of HTN 10/25/2015   Essential hypertension 10/25/2015   Multiple sclerosis (HCC) 10/02/2015   Chronic left shoulder pain 07/19/2015   Multiple sclerosis exacerbation (HCC) 07/14/2015   MS (multiple sclerosis) (HCC) 11/26/2014   Increased body mass index 11/26/2014   HPV test positive 11/26/2014   Status post laparoscopic supracervical hysterectomy 11/26/2014   Galactorrhea 11/26/2014   Back ache 05/21/2014   Adiposity 05/21/2014   Disordered sleep 05/21/2014   Muscle spasticity 05/21/2014   Spasticity 05/21/2014   Calculus of kidney 12/09/2013   Renal colic 12/09/2013   Hypercholesteremia 08/19/2013    Hereditary and idiopathic neuropathy 08/19/2013   Hypercholesterolemia without hypertriglyceridemia 08/19/2013   Bladder infection, chronic 07/25/2012   Disorder of bladder function 07/25/2012   Incomplete bladder emptying 07/25/2012   Microscopic hematuria 07/25/2012   Right upper quadrant pain 07/25/2012    ONSET DATE: 1995  REFERRING DIAG: MS  THERAPY DIAG:  Muscle weakness (generalized)  Multiple sclerosis exacerbation (HCC)  Difficulty in walking, not elsewhere classified  Unsteadiness on feet  Rationale for Evaluation and Treatment: Rehabilitation  SUBJECTIVE:                                                                                                                                                                                             SUBJECTIVE STATEMENT: Patient had OT prior to PT session, is very tired but happy with her success at toilet transfer with assistance.  Pt accompanied by: self  PERTINENT HISTORY:  Patient is returning to PT s/p hospitalization.  s/p ORIF of R tibia shaft fracture 09/23/2022. Patient has weakness in BLE with RLE>LLE. She drives with hand controls. Patient has been diagnosed with MS in 1995. PMH includes: back pain, CBP, chronic L shoulder pain, galactorrhea, neuropathy, HPV, hypercholestermeia, HTN, MS, osteopenia, PONV, wrist fracture. Additional order for other closed fracture of proximal end of R tibia with routine healing. Still has to use [employer].   PAIN:  Are you having pain?  Occasional pain in RLE; primarily in knee  PRECAUTIONS: Fall  RED FLAGS: None   WEIGHT BEARING RESTRICTIONS: No  FALLS: Has patient fallen in last 6 months? No  LIVING ENVIRONMENT: Lives with: lives with their family Lives in: House/apartment Stairs:  ramp Has following equipment at home: Otho Blitz - 2 wheeled, Wheelchair (manual), shower chair, and Grab bars  PLOF: Independent with household mobility with device  PATIENT GOALS: to get her  independence back. To be able to get into car, toilet, and get dressed independently  OBJECTIVE:  Note: Objective measures were completed at Evaluation unless otherwise noted.  DIAGNOSTIC FINDINGS: MRI of the brain 03/18/2020 showed multiple T2/FLAIR hyperintense foci in the periventricular, juxtacortical and deep white matter.  There were no infratentorial lesions noted.  None of the foci enhanced.   Due to severe claustrophobia, the study was done with conscious sedation in the hospital   COGNITION: Overall cognitive status: Within functional limits for tasks assessed   SENSATION: Lack of sensation in feet, loss in bilateral lateral aspect of knee  COORDINATION: Does not have the strength for functional LE heel slide test     MUSCLE TONE: BLE mild tone    POSTURE: rounded shoulders, forward head, anterior pelvic tilt, and weight shift left    LOWER EXTREMITY MMT:    MMT Right Eval Left Eval  Hip flexion 0.9 1.5  Hip extension    Hip abduction 1.8 2.6  Hip adduction 3.1 1.9  Hip internal rotation    Hip external rotation    Knee flexion 0.9 1.5  Knee extension 0.8 1.2  Ankle dorsiflexion    Ankle plantarflexion    Ankle inversion    Ankle eversion    (Blank rows = not tested)  BED MOBILITY:  Assess in future session due to limited time  TRANSFERS: Assistive device utilized:  Bariatric RW with sit to stands, slide board to table   Sit to stand:  unable to reach full stand Stand to sit:  unable to reach full stand  Chair to chair:  slide board with CGA     GAIT: Unable to ambulate at this time.   FUNCTIONAL TESTS:  Sit to stand: tricep press with BUE; unable to bring arm to walker.    Function In Sitting Test (FIST)  (1/2 femur on surface; hips/knees flexed to 90deg)   - indicate bed or mat table / step stool if used  SCORING KEY: 4 = Independent (completes task independently & successfully) 3 = Verbal Cues/Increased Time (completes task  independently & successfully and only needs more time/cues) 2 = Upper Extremity Support (must use UE for support or assistance to complete successfully) 1 = Needs Assistance (unable to complete w/o physical assist; DOCUMENT LEVEL: min, mod, max) 0 = Dependent (requires complete physical assist; unable to complete successfully even w/ physical assist)  Randomly Administer Once Throughout Exam  4 - Anterior Nudge (superior sternum)  4 - Posterior Nudge (between scapular spines)  4 - Lateral Nudge (to dominant side at acromion)     4 - Static sitting (30 seconds)  4 - Sitting, shake 'no' (left and right)  4 - Sitting, eyes closed (30 seconds)   0 - Sitting, lift foot (dominant side, lift foot 1 inch twice)    2 - Pick up object from behind (object at midline, hands breadth posterior)  3 - Forward reach (use dominant arm, must complete full motion) 2 - Lateral reach (use dominant arm, clear opposite ischial tuberosity) 2 - Pick up object from floor (from between feet)   2 - Posterior scooting (move backwards 2 inches)  2 - Anterior scooting (move forward 2 inches)  2 - Lateral scooting (move to dominant side 2 inches)    TOTAL = 39/56  Notes/comments: slide board tranfer to/from table   MCD > 5 points MCID for IP REHAB > 6 points  Function In Sitting Test (FIST) 08/14/23 (1/2 femur on surface; hips/knees flexed to 90deg)   - indicate bed or mat table / step stool if used  SCORING KEY: 4 = Independent (completes task independently & successfully) 3 =  Verbal Cues/Increased Time (completes task independently & successfully and only needs more time/cues) 2 = Upper Extremity Support (must use UE for support or assistance to complete successfully) 1 = Needs Assistance (unable to complete w/o physical assist; DOCUMENT LEVEL: min, mod, max) 0 = Dependent (requires complete physical assist; unable to complete successfully even w/ physical assist)  Randomly Administer Once Throughout Exam   4 - Anterior Nudge (superior sternum)  4 - Posterior Nudge (between scapular spines)  4 - Lateral Nudge (to dominant side at acromion)     4 - Static sitting (30 seconds)  4 - Sitting, shake 'no' (left and right)  4 - Sitting, eyes closed (30 seconds)   0 - Sitting, lift foot (dominant side, lift foot 1 inch twice)    4 - Pick up object from behind (object at midline, hands breadth posterior)  4 - Forward reach (use dominant arm, must complete full motion) 4 - Lateral reach (use dominant arm, clear opposite ischial tuberosity) 2 - Pick up object from floor (from between feet)   3 - Posterior scooting (move backwards 2 inches)  3 - Anterior scooting (move forward 2 inches)  3 - Lateral scooting (move to dominant side 2 inches)    TOTAL = 47/56    MCD > 5 points MCID for IP REHAB > 6 points                                                                                                                              TREATMENT DATE: 08/30/23 TherAct:  All activities performed with gait belt and CGA unless otherwise noted.   In // bars: Sit to stand; static stand with alternate tapping to PT head for increased elevation x15 each UE Partial sit to stand with focus on LE only 5x; ; 2 sets  Therex 9lb DB: -bicep curl 10x -hammer curl with single arm 10x -chest press 10x  -woodchop 10x; each side -overhead press 10x  Hamstring stretch 60 seconds each LE x2 sets    PATIENT EDUCATION: Education details: goals, POC, HEP  Person educated: Patient Education method: Explanation, Demonstration, Tactile cues, Verbal cues, and Handouts Education comprehension: verbalized understanding, returned demonstration, verbal cues required, tactile cues required, and needs further education  HOME EXERCISE PROGRAM: Stand 3x/day in stander   GOALS: Goals reviewed with patient? Yes  SHORT TERM GOALS: Target date: 08/08/2023    Patient will be independent in home exercise program to  improve strength/mobility for better functional independence with ADLs.  Baseline: 4/8: compliance Goal status: MET    LONG TERM GOALS: Target date: 10/04/2023    Patient will tolerate five consecutive stands with UE support from wheelchair to standing to improve functional mobility.  Baseline: unable to perform 4/8:unable to perform full stand Goal status: Ongoing   2.  Patient will ambulate 10 ft with bRW with wheelchair follow.  Baseline:  unable to ambulate 4/8: unable to ambulate Goal status: partially met  3.  Patient will improve FIST score >6 points to demonstrate improved stability and ability to perform ADLs.  Baseline:  3/6: 39/56 4/8: 47/ 56  Goal status: MET  4.  Patient will perform toileting with mod I at home for improved independence.  Baseline: 3/6: requires assistance 4/8: requires dependence on machine Goal status: Partially Met    ASSESSMENT:  CLINICAL IMPRESSION: Patient is very fatigued from previous OT session limiting session duration. Patient reached exhaustion point and session terminated early. Patient highly motivated despite her fatigue. Patient will benefit from skilled physical therapy to improve mobility, stability, and independence.   OBJECTIVE IMPAIRMENTS: Abnormal gait, cardiopulmonary status limiting activity, decreased activity tolerance, decreased balance, decreased coordination, decreased endurance, decreased mobility, difficulty walking, decreased ROM, decreased strength, hypomobility, increased fascial restrictions, impaired perceived functional ability, impaired flexibility, impaired sensation, improper body mechanics, and postural dysfunction.   ACTIVITY LIMITATIONS: carrying, lifting, bending, sitting, standing, squatting, sleeping, stairs, transfers, bed mobility, continence, bathing, toileting, dressing, self feeding, reach over head, hygiene/grooming, locomotion level, and caring for others  PARTICIPATION LIMITATIONS: meal prep,  cleaning, laundry, interpersonal relationship, driving, shopping, community activity, and church  PERSONAL FACTORS: Age, Past/current experiences, Time since onset of injury/illness/exacerbation, Transportation, and 3+ comorbidities: ack pain, CBP, chronic L shoulder pain, galactorrhea, neuropathy, HPV, hypercholestermeia, HTN, MS, osteopenia, PONV, wrist fracture  are also affecting patient's functional outcome.   REHAB POTENTIAL: Good  CLINICAL DECISION MAKING: Evolving/moderate complexity  EVALUATION COMPLEXITY: Moderate  PLAN:  PT FREQUENCY: 2x/week  PT DURATION: 12 weeks  PLANNED INTERVENTIONS: 97164- PT Re-evaluation, 97110-Therapeutic exercises, 97530- Therapeutic activity, 97112- Neuromuscular re-education, 97535- Self Care, 16109- Manual therapy, [phone]- Gait training, [phone]- Orthotic Fit/training, [phone]- Canalith repositioning, [id number]- Aquatic Therapy, [phone]- Electrical stimulation (unattended), [phone]- Electrical stimulation (manual), [id number]- Ultrasound, [id number]- Traction (mechanical), Patient/Family education, Balance training, Stair training, Taping, Dry Needling, Joint mobilization, Joint manipulation, Spinal manipulation, Spinal mobilization, Scar mobilization, Compression bandaging, Vestibular training, Visual/preceptual remediation/compensation, Cognitive remediation, DME instructions, Cryotherapy, Moist heat, and Biofeedback  PLAN FOR NEXT SESSION: sit to stands; try supported walking    Maraki Macquarrie, PT 08/30/2023, 11:36 AM

## 2023-08-30 ENCOUNTER — Ambulatory Visit: Payer: PPO

## 2023-08-30 ENCOUNTER — Ambulatory Visit

## 2023-08-30 DIAGNOSIS — R262 Difficulty in walking, not elsewhere classified: Secondary | ICD-10-CM

## 2023-08-30 DIAGNOSIS — G35 Multiple sclerosis: Secondary | ICD-10-CM

## 2023-08-30 DIAGNOSIS — M6281 Muscle weakness (generalized): Secondary | ICD-10-CM

## 2023-08-30 DIAGNOSIS — R2681 Unsteadiness on feet: Secondary | ICD-10-CM

## 2023-08-31 NOTE — Therapy (Signed)
 OUTPATIENT OCCUPATIONAL THERAPY NEURO TREATMENT NOTE   Patient Name: Lisa Williams MRN: [id number] DOB:1960/07/29, 63 y.o., female Today's Date: 08/31/2023  PCP: Dr. Overton Blotter   Neurologist Dr. Octavio Ben at [facility] REFERRING PROVIDER: Dr. Overton Blotter    END OF SESSION:  OT End of Session - 08/31/23 0803     Visit Number 4    Number of Visits 24    Date for OT Re-Evaluation 11/08/23    Progress Note Due on Visit 10    OT Start Time 1015    OT Stop Time 1100    OT Time Calculation (min) 45 min    Equipment Utilized During Treatment manual wc    Activity Tolerance Patient tolerated treatment well    Behavior During Therapy [facility] for tasks assessed/performed             Past Medical History:  Diagnosis Date   Abdominal pain, right upper quadrant    Back pain    Calculus of kidney 12/09/2013   Chronic back pain    unspecified   Chronic left shoulder pain 07/19/2015   Complication of anesthesia    Functional disorder of bladder    other   Galactorrhea 11/26/2014   Chronic    Hereditary and idiopathic neuropathy 08/19/2013   History of kidney stones    HPV test positive    Hypercholesteremia 08/19/2013   Hypertension    Incomplete bladder emptying    Microscopic hematuria    MS (multiple sclerosis) (HCC)    Muscle spasticity 05/21/2014   Nonspecific findings on examination of urine    other   Osteopenia    PONV (postoperative nausea and vomiting)    Status post laparoscopic supracervical hysterectomy 11/26/2014   Tobacco user 11/26/2014   Wrist fracture    Past Surgical History:  Procedure Laterality Date   bilateral tubal ligation  1996   BREAST CYST EXCISION Left 2002   CYST EXCISION Left 05/10/2022   Procedure: CYST REMOVAL;  Surgeon: Eldred Grego, MD;  Location: [facility] ORS;  Service: General;  Laterality: Left;   FRACTURE SURGERY     KNEE SURGERY     right   LAPAROSCOPIC SUPRACERVICAL HYSTERECTOMY  08/05/2013   ORIF WRIST FRACTURE  Left 01/17/2017   Procedure: OPEN REDUCTION INTERNAL FIXATION (ORIF) WRIST FRACTURE;  Surgeon: Jerlyn Moons, MD;  Location: [facility] ORS;  Service: Orthopedics;  Laterality: Left;   RADIOLOGY WITH ANESTHESIA N/A 03/18/2020   Procedure: MRI WITH ANESTHESIA CERVICAL SPINE AND BRAIN  WITH AND WITHOUT CONTRAST;  Surgeon: Radiologist, Medication, MD;  Location: [facility] OR;  Service: Radiology;  Laterality: N/A;   TUBAL LIGATION Bilateral    VAGINAL HYSTERECTOMY  03/2006   Patient Active Problem List   Diagnosis Date Noted   Sinus tachycardia 07/07/2023   Hypokalemia 07/29/2022   Weakness of both lower extremities 07/29/2022   Abscess of left groin 11/15/2021   Abnormal LFTs 11/15/2021   Acute respiratory disease due to COVID-19 virus 11/21/2020   Weakness    Hypoalbuminemia due to protein-calorie malnutrition [facility])    Neurogenic bowel    Neurogenic bladder    Labile blood pressure    Neuropathic pain    Abscess of female pelvis    SVT (supraventricular tachycardia) (HCC)    Radial styloid tenosynovitis 03/12/2018   Wheelchair dependence 02/27/2018   Localized osteoporosis with current pathological fracture with routine healing 01/19/2017   Wrist fracture 01/16/2017   Sprain of ankle 03/23/2016   Closed fracture of lateral malleolus 03/16/2016  Health care maintenance 01/24/2016   Blood pressure elevated without history of HTN 10/25/2015   Essential hypertension 10/25/2015   Multiple sclerosis (HCC) 10/02/2015   Chronic left shoulder pain 07/19/2015   Multiple sclerosis exacerbation (HCC) 07/14/2015   MS (multiple sclerosis) (HCC) 11/26/2014   Increased body mass index 11/26/2014   HPV test positive 11/26/2014   Status post laparoscopic supracervical hysterectomy 11/26/2014   Galactorrhea 11/26/2014   Back ache 05/21/2014   Adiposity 05/21/2014   Disordered sleep 05/21/2014   Muscle spasticity 05/21/2014   Spasticity 05/21/2014   Calculus of kidney 12/09/2013   Renal colic  12/09/2013   Hypercholesteremia 08/19/2013   Hereditary and idiopathic neuropathy 08/19/2013   Hypercholesterolemia without hypertriglyceridemia 08/19/2013   Bladder infection, chronic 07/25/2012   Disorder of bladder function 07/25/2012   Incomplete bladder emptying 07/25/2012   Microscopic hematuria 07/25/2012   Right upper quadrant pain 07/25/2012   ONSET DATE: 5/24 [name] and broke R leg which caused beginning of ADL decline; diagnosed with MS in 1995)  REFERRING DIAG: MS  THERAPY DIAG:  Muscle weakness (generalized)  Multiple sclerosis exacerbation (HCC)  Rationale for Evaluation and Treatment: Rehabilitation  SUBJECTIVE:  SUBJECTIVE STATEMENT: Pt reports doing well today. Pt accompanied by: self  PERTINENT HISTORY: Pt reports increased difficulty with basic self care tasks since breaking her R leg last May.  Since that time, mobility and ADLs have declined, with pt requiring assist from private caregivers and family to manage bathing, toileting, dressing, and functional transfers.  PRECAUTIONS: Fall  WEIGHT BEARING RESTRICTIONS: No  PAIN:  Are you having pain? No; occasional pain in R lower back, but not today  FALLS: Has patient fallen in last 6 months? No Last fall was May 17 of 2024  LIVING ENVIRONMENT: Lives with: lives with their family (including mother who has dementia and sister [name])  Lives in: 2 level home but pt resides on main level Stairs: ramp  Has following equipment at home: Wheelchair (manual), Shower bench, and hand held shower shower, 1 grab bar in the shower,  3in1 commode, sit to stand lift (electric), FWW, hospital bed  PLOF: modified indep with ADL/IADLs prior to May of 1610   PATIENT GOALS: Increase independence with basic self care tasks  OBJECTIVE:  Note: Objective measures were completed at Evaluation unless otherwise noted.  HAND DOMINANCE: Right  ADLs:  Overall ADLs: assist provided from paid caregiver and  sister Transfers/ambulation related to ADLs: sit to stand lift for all transfers, set up for sliding board in/out of bed  Eating: indep  Grooming: modified indep (wc level in mom's bathroom)  UB Dressing: set up (can't access her bedroom closet)  LB Dressing: able to sit on side of bed to don all LB clothing; assist to hike pants/underwear Toileting: assist with clothing management d/t standing within electric lift Bathing: bed bath with sister helping to wash backside Tub Shower transfers: N/A; pt has walk in shower in mom's bathroom (wc does fit in this bathroom but pt reports inability to transfer from wc and requires arm rests on a bench for a successful transfer attempt from any DME).  Tub bench is in pt's bathroom, but lift does not fit through bathroom doorway and pt reports inability to transfer up from tub bench (currently unable to manage either transfer)   Equipment:  see above  IADLs: Shopping: [employer] for shopping Light housekeeping: modified indep from wc level Meal Prep: modified indep from wc level Community mobility: relies on community transportation or family  members Medication management: indep Financial management: indep Handwriting:  NT; pt denies any FMC challenges  MOBILITY STATUS: Hx of falls  POSTURE COMMENTS:  Rounded shoulders, forward head, anterior pelvic tilt, and weight shift left   ACTIVITY TOLERANCE: Activity tolerance: Per PT; currently standing 30-60 sec within Light Gait harness/lift; currently non-ambulatory  UPPER EXTREMITY ROM:  BUEs WFL  UPPER EXTREMITY MMT:     MMT Right eval Left eval  Shoulder flexion 4+ 4  Shoulder abduction 4+ 4  Shoulder adduction    Shoulder extension    Shoulder internal rotation 4+ 4+  Shoulder external rotation 4 4  Middle trapezius    Lower trapezius    Elbow flexion 4+ 4+  Elbow extension 4+ 4+  Wrist flexion 4+ 4+  Wrist extension 4+ 4+  Wrist ulnar deviation    Wrist radial deviation    Wrist  pronation    Wrist supination    (Blank rows = not tested)  HAND FUNCTION/COORDINATION:  R/L WNL; pt denies any coordination deficits/ able to manipulate pills/clothing fasteners/etc without difficulty  SENSATION: WFL  EDEMA: No visible edema in BUEs  MUSCLE TONE: BUEs WNL  COGNITION: Overall cognitive status: Within functional limits for tasks assessed  VISION: wears glasses all the time, no reports of diplopia  PERCEPTION: Not tested  PRAXIS: WFL  OBSERVATIONS:  Pt pleasant, cooperative, and motivated to work towards improving indep with ADLs.                                                                                                     TREATMENT DATE: 08/30/23 Self Care: -scoot pivot wc<>toilet transfer at commode height 16.25", no sliding board used but use of grab bar beside toilet and min A both directions; assist for wc positioning/LE adjustments pre/post transfer.  Wc cushion removed for transfer from toilet back to wc d/t lower toilet height. -Measured variety of commode heights neighboring therapy gym and reviewed strategies for transfer attempts with/without sliding board for next session at highest toilet being 18.25" -Sliding board transfer wc<>mat table to return wc cushion to seat; set up assist with sliding board and adjustment of BLEs pre/post transfer.  Therapeutic Exercise: -Instructed in/completed bilat shoulder IR/ext AAROM using 1# dowel to increase behind the back flexibility for bathing/peri care; min vc and demo for technique.  PATIENT EDUCATION: Education details: toilet transfer Person educated: Patient Education method: Explanation, demo Education comprehension: verbalized understanding, demonstrating understanding; further training needed  HOME EXERCISE PROGRAM: IR A/AAROM to promote shoulder flexibility for peri care/bathing  GOALS: Goals reviewed with patient? Yes  SHORT TERM GOALS: Target date: 09/27/23  Pt will perform bed bed bath  with set up.  Baseline: Eval: Min-mod A for posterior washing Goal status: INITIAL  2.  Pt will utilize sliding board for wc<>drop arm commode transfer with SBA. Baseline: Eval: Currently using electric lift for transfer to [facility] Goal status: INITIAL  3.  Pt will be indep to perform HEP for maintaining BUE strength for ADLs and functional transfers. Baseline: Eval: HEP not yet initiated Goal status: INITIAL  LONG TERM GOALS:  Target date: 11/08/23  Pt will perform sink bath with min A. Baseline: Eval: Currently bed bathing d/t inability to stand at sink without electric lift (lift does not fit into pt's bathroom).   Goal status: INITIAL  2.  Pt will perform wc<>tub bench transfer with min A. Baseline: Eval: Unable Goal status: INITIAL  3.  Pt will perform squat pivot transfer wc<>BSC with SBA. Baseline: Eval: Eval: Currently using electric lift for transfer to [facility] Goal status: INITIAL  4. Pt will perform scoot transfer wc<>toilet using grab bars to allow toileting in community setting.  Baseline: Eval: Pt limits community outings d/t requiring use of lift for [facility] transfers.   Goal status: INITIAL  5.  Pt will tolerate standing x1 min to manage clothing in prep for toileting. Baseline: Eval: Currently standing in electric lift only, or within parallel bars without lift Goal status: INITIAL   ASSESSMENT:  CLINICAL IMPRESSION: Pt able to perform scoot pivot transfer wc<>toilet without sliding board this date to commode height 16.25, requiring wc cushion to be removed with transfer back to wc to ease transfer.  Sliding board not used d/t differing heights of wc and toilet; min A and max vc for positioning/technique.  Will plan to trial same transfer at higher toilet near therapy gym next session.  Pt will continue to benefit from skilled OT to work towards above noted goals in OT poc, working to maximize indep with daily tasks while reducing burden of care on caregivers.   PERFORMANCE  DEFICITS: in functional skills including ADLs, IADLs, strength, pain, flexibility, Gross motor control, mobility, balance, body mechanics, endurance, and decreased knowledge of use of DME, and psychosocial skills including coping strategies, environmental adaptation, habits, and routines and behaviors.   IMPAIRMENTS: are limiting patient from ADLs, IADLs, and social participation.   CO-MORBIDITIES: has co-morbidities such as neuropathy, back pain, obesity, MS, HTN  that affects occupational performance. Patient will benefit from skilled OT to address above impairments and improve overall function.  MODIFICATION OR ASSISTANCE TO COMPLETE EVALUATION: No modification of tasks or assist necessary to complete an evaluation.  OT OCCUPATIONAL PROFILE AND HISTORY: Detailed assessment: Review of records and additional review of physical, cognitive, psychosocial history related to current functional performance.  CLINICAL DECISION MAKING: Moderate - several treatment options, min-mod task modification necessary  REHAB POTENTIAL: Good  EVALUATION COMPLEXITY: Moderate    PLAN:  OT FREQUENCY: 2x/week  OT DURATION: 12 weeks  PLANNED INTERVENTIONS: 97168 OT Re-evaluation, 97535 self care/ADL training, 16109 therapeutic exercise, 97530 therapeutic activity, 97112 neuromuscular re-education, 97140 manual therapy, 97116 gait training, 60454 moist heat, 97010 cryotherapy, balance training, functional mobility training, psychosocial skills training, energy conservation, coping strategies training, patient/family education, and DME and/or AE instructions  RECOMMENDED OTHER SERVICES: None at this time (Pt currently receiving PT services in this clinic)  CONSULTED AND AGREED WITH PLAN OF CARE: Patient  PLAN FOR NEXT SESSION: see above  Marcus Sewer, MS, OTR/L   Casandra Claw, OT 08/31/2023, 8:04 AM

## 2023-09-03 NOTE — Therapy (Incomplete)
 OUTPATIENT PHYSICAL THERAPY NEURO TREATMENT     Patient Name: Lisa Williams MRN: [id number] DOB:1961-03-09, 63 y.o., female Today's Date: 09/03/2023   PCP: Overton Blotter  REFERRING PROVIDER: Overton Blotter  END OF SESSION:               Past Medical History:  Diagnosis Date   Abdominal pain, right upper quadrant    Back pain    Calculus of kidney 12/09/2013   Chronic back pain    unspecified   Chronic left shoulder pain 07/19/2015   Complication of anesthesia    Functional disorder of bladder    other   Galactorrhea 11/26/2014   Chronic    Hereditary and idiopathic neuropathy 08/19/2013   History of kidney stones    HPV test positive    Hypercholesteremia 08/19/2013   Hypertension    Incomplete bladder emptying    Microscopic hematuria    MS (multiple sclerosis) (HCC)    Muscle spasticity 05/21/2014   Nonspecific findings on examination of urine    other   Osteopenia    PONV (postoperative nausea and vomiting)    Status post laparoscopic supracervical hysterectomy 11/26/2014   Tobacco user 11/26/2014   Wrist fracture    Past Surgical History:  Procedure Laterality Date   bilateral tubal ligation  1996   BREAST CYST EXCISION Left 2002   CYST EXCISION Left 05/10/2022   Procedure: CYST REMOVAL;  Surgeon: Eldred Grego, MD;  Location: [facility];  Service: General;  Laterality: Left;   FRACTURE SURGERY     KNEE SURGERY     right   LAPAROSCOPIC SUPRACERVICAL HYSTERECTOMY  08/05/2013   ORIF WRIST FRACTURE Left 01/17/2017   Procedure: OPEN REDUCTION INTERNAL FIXATION (ORIF) WRIST FRACTURE;  Surgeon: Jerlyn Moons, MD;  Location: [facility];  Service: Orthopedics;  Laterality: Left;   RADIOLOGY WITH ANESTHESIA N/A 03/18/2020   Procedure: MRI WITH ANESTHESIA CERVICAL SPINE AND BRAIN  WITH AND WITHOUT CONTRAST;  Surgeon: Radiologist, Medication, MD;  Location: [facility] OR;  Service: Radiology;  Laterality: N/A;   TUBAL LIGATION Bilateral     VAGINAL HYSTERECTOMY  03/2006   Patient Active Problem List   Diagnosis Date Noted   Sinus tachycardia 07/07/2023   Hypokalemia 07/29/2022   Weakness of both lower extremities 07/29/2022   Abscess of left groin 11/15/2021   Abnormal LFTs 11/15/2021   Acute respiratory disease due to COVID-19 virus 11/21/2020   Weakness    Hypoalbuminemia due to protein-calorie malnutrition [facility])    Neurogenic bowel    Neurogenic bladder    Labile blood pressure    Neuropathic pain    Abscess of female pelvis    SVT (supraventricular tachycardia) (HCC)    Radial styloid tenosynovitis 03/12/2018   Wheelchair dependence 02/27/2018   Localized osteoporosis with current pathological fracture with routine healing 01/19/2017   Wrist fracture 01/16/2017   Sprain of ankle 03/23/2016   Closed fracture of lateral malleolus 03/16/2016   Health care maintenance 01/24/2016   Blood pressure elevated without history of HTN 10/25/2015   Essential hypertension 10/25/2015   Multiple sclerosis (HCC) 10/02/2015   Chronic left shoulder pain 07/19/2015   Multiple sclerosis exacerbation (HCC) 07/14/2015   MS (multiple sclerosis) (HCC) 11/26/2014   Increased body mass index 11/26/2014   HPV test positive 11/26/2014   Status post laparoscopic supracervical hysterectomy 11/26/2014   Galactorrhea 11/26/2014   Back ache 05/21/2014   Adiposity 05/21/2014   Disordered sleep 05/21/2014   Muscle spasticity 05/21/2014   Spasticity 05/21/2014  Calculus of kidney 12/09/2013   Renal colic 12/09/2013   Hypercholesteremia 08/19/2013   Hereditary and idiopathic neuropathy 08/19/2013   Hypercholesterolemia without hypertriglyceridemia 08/19/2013   Bladder infection, chronic 07/25/2012   Disorder of bladder function 07/25/2012   Incomplete bladder emptying 07/25/2012   Microscopic hematuria 07/25/2012   Right upper quadrant pain 07/25/2012    ONSET DATE: 1995  REFERRING DIAG: MS  THERAPY DIAG:  No diagnosis  found.  Rationale for Evaluation and Treatment: Rehabilitation  SUBJECTIVE:                                                                                                                                                                                             SUBJECTIVE STATEMENT: *** Pt accompanied by: self  PERTINENT HISTORY:  Patient is returning to PT s/p hospitalization.  s/p ORIF of R tibia shaft fracture 09/23/2022. Patient has weakness in BLE with RLE>LLE. She drives with hand controls. Patient has been diagnosed with MS in 1995. PMH includes: back pain, CBP, chronic L shoulder pain, galactorrhea, neuropathy, HPV, hypercholestermeia, HTN, MS, osteopenia, PONV, wrist fracture. Additional order for other closed fracture of proximal end of R tibia with routine healing. Still has to use [employer].   PAIN:  Are you having pain?  Occasional pain in RLE; primarily in knee  PRECAUTIONS: Fall  RED FLAGS: None   WEIGHT BEARING RESTRICTIONS: No  FALLS: Has patient fallen in last 6 months? No  LIVING ENVIRONMENT: Lives with: lives with their family Lives in: House/apartment Stairs:  ramp Has following equipment at home: Otho Blitz - 2 wheeled, Wheelchair (manual), shower chair, and Grab bars  PLOF: Independent with household mobility with device  PATIENT GOALS: to get her independence back. To be able to get into car, toilet, and get dressed independently  OBJECTIVE:  Note: Objective measures were completed at Evaluation unless otherwise noted.  DIAGNOSTIC FINDINGS: MRI of the brain 03/18/2020 showed multiple T2/FLAIR hyperintense foci in the periventricular, juxtacortical and deep white matter.  There were no infratentorial lesions noted.  None of the foci enhanced.   Due to severe claustrophobia, the study was done with conscious sedation in the hospital   COGNITION: Overall cognitive status: Within functional limits for tasks assessed   SENSATION: Lack of sensation in feet,  loss in bilateral lateral aspect of knee  COORDINATION: Does not have the strength for functional LE heel slide test     MUSCLE TONE: BLE mild tone    POSTURE: rounded shoulders, forward head, anterior pelvic tilt, and weight shift left    LOWER EXTREMITY MMT:    MMT Right Eval Left Eval  Hip flexion 0.9 1.5  Hip extension    Hip abduction 1.8 2.6  Hip adduction 3.1 1.9  Hip internal rotation    Hip external rotation    Knee flexion 0.9 1.5  Knee extension 0.8 1.2  Ankle dorsiflexion    Ankle plantarflexion    Ankle inversion    Ankle eversion    (Blank rows = not tested)  BED MOBILITY:  Assess in future session due to limited time  TRANSFERS: Assistive device utilized:  Bariatric RW with sit to stands, slide board to table   Sit to stand:  unable to reach full stand Stand to sit:  unable to reach full stand  Chair to chair:  slide board with CGA     GAIT: Unable to ambulate at this time.   FUNCTIONAL TESTS:  Sit to stand: tricep press with BUE; unable to bring arm to walker.    Function In Sitting Test (FIST)  (1/2 femur on surface; hips/knees flexed to 90deg)   - indicate bed or mat table / step stool if used  SCORING KEY: 4 = Independent (completes task independently & successfully) 3 = Verbal Cues/Increased Time (completes task independently & successfully and only needs more time/cues) 2 = Upper Extremity Support (must use UE for support or assistance to complete successfully) 1 = Needs Assistance (unable to complete w/o physical assist; DOCUMENT LEVEL: min, mod, max) 0 = Dependent (requires complete physical assist; unable to complete successfully even w/ physical assist)  Randomly Administer Once Throughout Exam  4 - Anterior Nudge (superior sternum)  4 - Posterior Nudge (between scapular spines)  4 - Lateral Nudge (to dominant side at acromion)     4 - Static sitting (30 seconds)  4 - Sitting, shake 'no' (left and right)  4 - Sitting, eyes  closed (30 seconds)   0 - Sitting, lift foot (dominant side, lift foot 1 inch twice)    2 - Pick up object from behind (object at midline, hands breadth posterior)  3 - Forward reach (use dominant arm, must complete full motion) 2 - Lateral reach (use dominant arm, clear opposite ischial tuberosity) 2 - Pick up object from floor (from between feet)   2 - Posterior scooting (move backwards 2 inches)  2 - Anterior scooting (move forward 2 inches)  2 - Lateral scooting (move to dominant side 2 inches)    TOTAL = 39/56  Notes/comments: slide board tranfer to/from table   MCD > 5 points MCID for IP REHAB > 6 points  Function In Sitting Test (FIST) 08/14/23 (1/2 femur on surface; hips/knees flexed to 90deg)   - indicate bed or mat table / step stool if used  SCORING KEY: 4 = Independent (completes task independently & successfully) 3 = Verbal Cues/Increased Time (completes task independently & successfully and only needs more time/cues) 2 = Upper Extremity Support (must use UE for support or assistance to complete successfully) 1 = Needs Assistance (unable to complete w/o physical assist; DOCUMENT LEVEL: min, mod, max) 0 = Dependent (requires complete physical assist; unable to complete successfully even w/ physical assist)  Randomly Administer Once Throughout Exam  4 - Anterior Nudge (superior sternum)  4 - Posterior Nudge (between scapular spines)  4 - Lateral Nudge (to dominant side at acromion)     4 - Static sitting (30 seconds)  4 - Sitting, shake 'no' (left and right)  4 - Sitting, eyes closed (30 seconds)   0 - Sitting, lift foot (dominant side, lift foot  1 inch twice)    4 - Pick up object from behind (object at midline, hands breadth posterior)  4 - Forward reach (use dominant arm, must complete full motion) 4 - Lateral reach (use dominant arm, clear opposite ischial tuberosity) 2 - Pick up object from floor (from between feet)   3 - Posterior scooting (move backwards  2 inches)  3 - Anterior scooting (move forward 2 inches)  3 - Lateral scooting (move to dominant side 2 inches)    TOTAL = 47/56    MCD > 5 points MCID for IP REHAB > 6 points                                                                                                                              TREATMENT DATE: 09/03/23 TherAct:  All activities performed with gait belt and CGA unless otherwise noted.   In // bars: Sit to stand; static stand with alternate tapping to PT head for increased elevation x15 each UE Partial sit to stand with focus on LE only 5x; ; 2 sets  Therex 9lb DB: -bicep curl 10x -hammer curl with single arm 10x -chest press 10x  -woodchop 10x; each side -overhead press 10x  Hamstring stretch 60 seconds each LE x2 sets    PATIENT EDUCATION: Education details: goals, POC, HEP  Person educated: Patient Education method: Explanation, Demonstration, Tactile cues, Verbal cues, and Handouts Education comprehension: verbalized understanding, returned demonstration, verbal cues required, tactile cues required, and needs further education  HOME EXERCISE PROGRAM: Stand 3x/day in stander   GOALS: Goals reviewed with patient? Yes  SHORT TERM GOALS: Target date: 08/08/2023    Patient will be independent in home exercise program to improve strength/mobility for better functional independence with ADLs.  Baseline: 4/8: compliance Goal status: MET    LONG TERM GOALS: Target date: 10/04/2023    Patient will tolerate five consecutive stands with UE support from wheelchair to standing to improve functional mobility.  Baseline: unable to perform 4/8:unable to perform full stand Goal status: Ongoing   2.  Patient will ambulate 10 ft with bRW with wheelchair follow.  Baseline:  unable to ambulate 4/8: unable to ambulate Goal status: partially met  3.  Patient will improve FIST score >6 points to demonstrate improved stability and ability to perform ADLs.   Baseline:  3/6: 39/56 4/8: 47/ 56  Goal status: MET  4.  Patient will perform toileting with mod I at home for improved independence.  Baseline: 3/6: requires assistance 4/8: requires dependence on machine Goal status: Partially Met    ASSESSMENT:  CLINICAL IMPRESSION: *** Patient will benefit from skilled physical therapy to improve mobility, stability, and independence.   OBJECTIVE IMPAIRMENTS: Abnormal gait, cardiopulmonary status limiting activity, decreased activity tolerance, decreased balance, decreased coordination, decreased endurance, decreased mobility, difficulty walking, decreased ROM, decreased strength, hypomobility, increased fascial restrictions, impaired perceived functional ability, impaired flexibility, impaired sensation, improper body mechanics, and postural dysfunction.  ACTIVITY LIMITATIONS: carrying, lifting, bending, sitting, standing, squatting, sleeping, stairs, transfers, bed mobility, continence, bathing, toileting, dressing, self feeding, reach over head, hygiene/grooming, locomotion level, and caring for others  PARTICIPATION LIMITATIONS: meal prep, cleaning, laundry, interpersonal relationship, driving, shopping, community activity, and church  PERSONAL FACTORS: Age, Past/current experiences, Time since onset of injury/illness/exacerbation, Transportation, and 3+ comorbidities: ack pain, CBP, chronic L shoulder pain, galactorrhea, neuropathy, HPV, hypercholestermeia, HTN, MS, osteopenia, PONV, wrist fracture  are also affecting patient's functional outcome.   REHAB POTENTIAL: Good  CLINICAL DECISION MAKING: Evolving/moderate complexity  EVALUATION COMPLEXITY: Moderate  PLAN:  PT FREQUENCY: 2x/week  PT DURATION: 12 weeks  PLANNED INTERVENTIONS: 97164- PT Re-evaluation, 97110-Therapeutic exercises, 97530- Therapeutic activity, 97112- Neuromuscular re-education, 97535- Self Care, 56213- Manual therapy, [phone]- Gait training, [phone]- Orthotic  Fit/training, [phone]- Canalith repositioning, [id number]- Aquatic Therapy, [phone]- Electrical stimulation (unattended), [phone]- Electrical stimulation (manual), [id number]- Ultrasound, [id number]- Traction (mechanical), Patient/Family education, Balance training, Stair training, Taping, Dry Needling, Joint mobilization, Joint manipulation, Spinal manipulation, Spinal mobilization, Scar mobilization, Compression bandaging, Vestibular training, Visual/preceptual remediation/compensation, Cognitive remediation, DME instructions, Cryotherapy, Moist heat, and Biofeedback  PLAN FOR NEXT SESSION: sit to stands; try supported walking    Everett Ricciardelli, PT 09/03/2023, 5:19 PM

## 2023-09-04 ENCOUNTER — Ambulatory Visit: Payer: PPO

## 2023-09-04 ENCOUNTER — Ambulatory Visit

## 2023-09-04 DIAGNOSIS — M6281 Muscle weakness (generalized): Secondary | ICD-10-CM

## 2023-09-04 DIAGNOSIS — R262 Difficulty in walking, not elsewhere classified: Secondary | ICD-10-CM

## 2023-09-04 DIAGNOSIS — G35 Multiple sclerosis: Secondary | ICD-10-CM

## 2023-09-04 DIAGNOSIS — R2681 Unsteadiness on feet: Secondary | ICD-10-CM

## 2023-09-04 DIAGNOSIS — R278 Other lack of coordination: Secondary | ICD-10-CM

## 2023-09-04 NOTE — Therapy (Signed)
 OUTPATIENT PHYSICAL THERAPY NEURO TREATMENT     Patient Name: Cleofas Hayner Holzheimer MRN: [id number] DOB:15-Jul-1960, 63 y.o., female Today's Date: 09/04/2023   PCP: Overton Blotter  REFERRING PROVIDER: Overton Blotter  END OF SESSION:  PT End of Session - 09/04/23 1336     Visit Number 15    Number of Visits 24    Date for PT Re-Evaluation 10/04/23    PT Start Time 1101    PT Stop Time 1144    PT Time Calculation (min) 43 min    Equipment Utilized During Treatment Gait belt    Activity Tolerance Patient tolerated treatment well;No increased pain    Behavior During Therapy [facility] for tasks assessed/performed                         Past Medical History:  Diagnosis Date   Abdominal pain, right upper quadrant    Back pain    Calculus of kidney 12/09/2013   Chronic back pain    unspecified   Chronic left shoulder pain 07/19/2015   Complication of anesthesia    Functional disorder of bladder    other   Galactorrhea 11/26/2014   Chronic    Hereditary and idiopathic neuropathy 08/19/2013   History of kidney stones    HPV test positive    Hypercholesteremia 08/19/2013   Hypertension    Incomplete bladder emptying    Microscopic hematuria    MS (multiple sclerosis) (HCC)    Muscle spasticity 05/21/2014   Nonspecific findings on examination of urine    other   Osteopenia    PONV (postoperative nausea and vomiting)    Status post laparoscopic supracervical hysterectomy 11/26/2014   Tobacco user 11/26/2014   Wrist fracture    Past Surgical History:  Procedure Laterality Date   bilateral tubal ligation  1996   BREAST CYST EXCISION Left 2002   CYST EXCISION Left 05/10/2022   Procedure: CYST REMOVAL;  Surgeon: Eldred Grego, MD;  Location: [facility] ORS;  Service: General;  Laterality: Left;   FRACTURE SURGERY     KNEE SURGERY     right   LAPAROSCOPIC SUPRACERVICAL HYSTERECTOMY  08/05/2013   ORIF WRIST FRACTURE Left 01/17/2017   Procedure: OPEN  REDUCTION INTERNAL FIXATION (ORIF) WRIST FRACTURE;  Surgeon: Jerlyn Moons, MD;  Location: [facility] OR[facility];  Service: Orthopedics;  Laterality: Left;   RADIOLOGY WITH ANESTHESIA N/A 03/18/2020   Procedure: MRI WITH ANESTHESIA CERVICAL SPINE AND BRAIN  WITH AND WITHOUT CONTRAST;  Surgeon: Radiologist, Medication, MD;  Location: [facility] OR;  Service: Radiology;  Laterality: N/A;   TUBAL LIGATION Bilateral    VAGINAL HYSTERECTOMY  03/2006   Patient Active Problem List   Diagnosis Date Noted   Sinus tachycardia 07/07/2023   Hypokalemia 07/29/2022   Weakness of both lower extremities 07/29/2022   Abscess of left groin 11/15/2021   Abnormal LFTs 11/15/2021   Acute respiratory disease due to COVID-19 virus 11/21/2020   Weakness    Hypoalbuminemia due to protein-calorie malnutrition [facility])    Neurogenic bowel    Neurogenic bladder    Labile blood pressure    Neuropathic pain    Abscess of female pelvis    SVT (supraventricular tachycardia) (HCC)    Radial styloid tenosynovitis 03/12/2018   Wheelchair dependence 02/27/2018   Localized osteoporosis with current pathological fracture with routine healing 01/19/2017   Wrist fracture 01/16/2017   Sprain of ankle 03/23/2016   Closed fracture of lateral malleolus 03/16/2016   Health care  maintenance 01/24/2016   Blood pressure elevated without history of HTN 10/25/2015   Essential hypertension 10/25/2015   Multiple sclerosis (HCC) 10/02/2015   Chronic left shoulder pain 07/19/2015   Multiple sclerosis exacerbation (HCC) 07/14/2015   MS (multiple sclerosis) (HCC) 11/26/2014   Increased body mass index 11/26/2014   HPV test positive 11/26/2014   Status post laparoscopic supracervical hysterectomy 11/26/2014   Galactorrhea 11/26/2014   Back ache 05/21/2014   Adiposity 05/21/2014   Disordered sleep 05/21/2014   Muscle spasticity 05/21/2014   Spasticity 05/21/2014   Calculus of kidney 12/09/2013   Renal colic 12/09/2013   Hypercholesteremia 08/19/2013    Hereditary and idiopathic neuropathy 08/19/2013   Hypercholesterolemia without hypertriglyceridemia 08/19/2013   Bladder infection, chronic 07/25/2012   Disorder of bladder function 07/25/2012   Incomplete bladder emptying 07/25/2012   Microscopic hematuria 07/25/2012   Right upper quadrant pain 07/25/2012    ONSET DATE: 1995  REFERRING DIAG: MS  THERAPY DIAG:  Muscle weakness (generalized)  Multiple sclerosis exacerbation (HCC)  Difficulty in walking, not elsewhere classified  Unsteadiness on feet  Rationale for Evaluation and Treatment: Rehabilitation  SUBJECTIVE:                                                                                                                                                                                             SUBJECTIVE STATEMENT: Patient reports feeling well today. Feeling stronger.  Pt accompanied by: self  PERTINENT HISTORY:  Patient is returning to PT s/p hospitalization.  s/p ORIF of R tibia shaft fracture 09/23/2022. Patient has weakness in BLE with RLE>LLE. She drives with hand controls. Patient has been diagnosed with MS in 1995. PMH includes: back pain, CBP, chronic L shoulder pain, galactorrhea, neuropathy, HPV, hypercholestermeia, HTN, MS, osteopenia, PONV, wrist fracture. Additional order for other closed fracture of proximal end of R tibia with routine healing. Still has to use [employer].   PAIN:  Are you having pain?  Occasional pain in RLE; primarily in knee  PRECAUTIONS: Fall  RED FLAGS: None   WEIGHT BEARING RESTRICTIONS: No  FALLS: Has patient fallen in last 6 months? No  LIVING ENVIRONMENT: Lives with: lives with their family Lives in: House/apartment Stairs:  ramp Has following equipment at home: Otho Blitz - 2 wheeled, Wheelchair (manual), shower chair, and Grab bars  PLOF: Independent with household mobility with device  PATIENT GOALS: to get her independence back. To be able to get into car, toilet, and  get dressed independently  OBJECTIVE:  Note: Objective measures were completed at Evaluation unless otherwise noted.  DIAGNOSTIC FINDINGS: MRI of the brain 03/18/2020 showed multiple T2/FLAIR hyperintense  foci in the periventricular, juxtacortical and deep white matter.  There were no infratentorial lesions noted.  None of the foci enhanced.   Due to severe claustrophobia, the study was done with conscious sedation in the hospital   COGNITION: Overall cognitive status: Within functional limits for tasks assessed   SENSATION: Lack of sensation in feet, loss in bilateral lateral aspect of knee  COORDINATION: Does not have the strength for functional LE heel slide test     MUSCLE TONE: BLE mild tone    POSTURE: rounded shoulders, forward head, anterior pelvic tilt, and weight shift left    LOWER EXTREMITY MMT:    MMT Right Eval Left Eval  Hip flexion 0.9 1.5  Hip extension    Hip abduction 1.8 2.6  Hip adduction 3.1 1.9  Hip internal rotation    Hip external rotation    Knee flexion 0.9 1.5  Knee extension 0.8 1.2  Ankle dorsiflexion    Ankle plantarflexion    Ankle inversion    Ankle eversion    (Blank rows = not tested)  BED MOBILITY:  Assess in future session due to limited time  TRANSFERS: Assistive device utilized:  Bariatric RW with sit to stands, slide board to table   Sit to stand:  unable to reach full stand Stand to sit:  unable to reach full stand  Chair to chair:  slide board with CGA     GAIT: Unable to ambulate at this time.   FUNCTIONAL TESTS:  Sit to stand: tricep press with BUE; unable to bring arm to walker.    Function In Sitting Test (FIST)  (1/2 femur on surface; hips/knees flexed to 90deg)   - indicate bed or mat table / step stool if used  SCORING KEY: 4 = Independent (completes task independently & successfully) 3 = Verbal Cues/Increased Time (completes task independently & successfully and only needs more time/cues) 2 = Upper  Extremity Support (must use UE for support or assistance to complete successfully) 1 = Needs Assistance (unable to complete w/o physical assist; DOCUMENT LEVEL: min, mod, max) 0 = Dependent (requires complete physical assist; unable to complete successfully even w/ physical assist)  Randomly Administer Once Throughout Exam  4 - Anterior Nudge (superior sternum)  4 - Posterior Nudge (between scapular spines)  4 - Lateral Nudge (to dominant side at acromion)     4 - Static sitting (30 seconds)  4 - Sitting, shake 'no' (left and right)  4 - Sitting, eyes closed (30 seconds)   0 - Sitting, lift foot (dominant side, lift foot 1 inch twice)    2 - Pick up object from behind (object at midline, hands breadth posterior)  3 - Forward reach (use dominant arm, must complete full motion) 2 - Lateral reach (use dominant arm, clear opposite ischial tuberosity) 2 - Pick up object from floor (from between feet)   2 - Posterior scooting (move backwards 2 inches)  2 - Anterior scooting (move forward 2 inches)  2 - Lateral scooting (move to dominant side 2 inches)    TOTAL = 39/56  Notes/comments: slide board tranfer to/from table   MCD > 5 points MCID for IP REHAB > 6 points  Function In Sitting Test (FIST) 08/14/23 (1/2 femur on surface; hips/knees flexed to 90deg)   - indicate bed or mat table / step stool if used  SCORING KEY: 4 = Independent (completes task independently & successfully) 3 = Verbal Cues/Increased Time (completes task independently & successfully and only  needs more time/cues) 2 = Upper Extremity Support (must use UE for support or assistance to complete successfully) 1 = Needs Assistance (unable to complete w/o physical assist; DOCUMENT LEVEL: min, mod, max) 0 = Dependent (requires complete physical assist; unable to complete successfully even w/ physical assist)  Randomly Administer Once Throughout Exam  4 - Anterior Nudge (superior sternum)  4 - Posterior Nudge (between  scapular spines)  4 - Lateral Nudge (to dominant side at acromion)     4 - Static sitting (30 seconds)  4 - Sitting, shake 'no' (left and right)  4 - Sitting, eyes closed (30 seconds)   0 - Sitting, lift foot (dominant side, lift foot 1 inch twice)    4 - Pick up object from behind (object at midline, hands breadth posterior)  4 - Forward reach (use dominant arm, must complete full motion) 4 - Lateral reach (use dominant arm, clear opposite ischial tuberosity) 2 - Pick up object from floor (from between feet)   3 - Posterior scooting (move backwards 2 inches)  3 - Anterior scooting (move forward 2 inches)  3 - Lateral scooting (move to dominant side 2 inches)    TOTAL = 47/56    MCD > 5 points MCID for IP REHAB > 6 points                                                                                                                              TREATMENT DATE: 09/04/23 TherAct:  All activities performed with gait belt and CGA unless otherwise noted. In // bars  W/c lift offs for tricep strengthening for improve UE tolerance for gait. 1x5. Close supervision.   STS x1, CGA to minA. Difficulty attaining standing.   Seated on yellow dynadisc:   Patty cake for core stability: x8 reps   Forward trunk lean to assist for pre STS: 3x7 cones alternating. VC's for anterior trunk lean.   D2 flexion holding rainbow ball: x8 R/L.    Gait Training: in // bars  2 laps with w/c follow. Seated rest b/t bouts. Working on foot clearance. Reliant on heavy BUE support. More difficulty progressing LLE > RLE.    PATIENT EDUCATION: Education details: goals, POC, HEP  Person educated: Patient Education method: Explanation, Demonstration, Tactile cues, Verbal cues, and Handouts Education comprehension: verbalized understanding, returned demonstration, verbal cues required, tactile cues required, and needs further education  HOME EXERCISE PROGRAM: Stand 3x/day in stander   GOALS: Goals  reviewed with patient? Yes  SHORT TERM GOALS: Target date: 08/08/2023    Patient will be independent in home exercise program to improve strength/mobility for better functional independence with ADLs.  Baseline: 4/8: compliance Goal status: MET    LONG TERM GOALS: Target date: 10/04/2023    Patient will tolerate five consecutive stands with UE support from wheelchair to standing to improve functional mobility.  Baseline: unable to perform 4/8:unable to perform full stand Goal status: Ongoing  2.  Patient will ambulate 10 ft with bRW with wheelchair follow.  Baseline:  unable to ambulate 4/8: unable to ambulate Goal status: partially met  3.  Patient will improve FIST score >6 points to demonstrate improved stability and ability to perform ADLs.  Baseline:  3/6: 39/56 4/8: 47/ 56  Goal status: MET  4.  Patient will perform toileting with mod I at home for improved independence.  Baseline: 3/6: requires assistance 4/8: requires dependence on machine Goal status: Partially Met    ASSESSMENT:  CLINICAL IMPRESSION: Continuing with PT POC working on gait, standing tolerance, and core stability. Pt able to complete 2 laps today in // bars but remains needing heavy BUE support and difficulty with progressing LLE. Unable to tolerate additional STS/standing efforts due to fatigue. Remainder of session working on anterior weight shifting to improved STS mechanics as pt has heavy posterior bias. Pt with excellent motivation throughout session. Patient will benefit from skilled physical therapy to improve mobility, stability, and independence.   OBJECTIVE IMPAIRMENTS: Abnormal gait, cardiopulmonary status limiting activity, decreased activity tolerance, decreased balance, decreased coordination, decreased endurance, decreased mobility, difficulty walking, decreased ROM, decreased strength, hypomobility, increased fascial restrictions, impaired perceived functional ability, impaired  flexibility, impaired sensation, improper body mechanics, and postural dysfunction.   ACTIVITY LIMITATIONS: carrying, lifting, bending, sitting, standing, squatting, sleeping, stairs, transfers, bed mobility, continence, bathing, toileting, dressing, self feeding, reach over head, hygiene/grooming, locomotion level, and caring for others  PARTICIPATION LIMITATIONS: meal prep, cleaning, laundry, interpersonal relationship, driving, shopping, community activity, and church  PERSONAL FACTORS: Age, Past/current experiences, Time since onset of injury/illness/exacerbation, Transportation, and 3+ comorbidities: ack pain, CBP, chronic L shoulder pain, galactorrhea, neuropathy, HPV, hypercholestermeia, HTN, MS, osteopenia, PONV, wrist fracture  are also affecting patient's functional outcome.   REHAB POTENTIAL: Good  CLINICAL DECISION MAKING: Evolving/moderate complexity  EVALUATION COMPLEXITY: Moderate  PLAN:  PT FREQUENCY: 2x/week  PT DURATION: 12 weeks  PLANNED INTERVENTIONS: 97164- PT Re-evaluation, 97110-Therapeutic exercises, 97530- Therapeutic activity, 97112- Neuromuscular re-education, 97535- Self Care, 91478- Manual therapy, [phone]- Gait training, [phone]- Orthotic Fit/training, [phone]- Canalith repositioning, [id number]- Aquatic Therapy, [phone]- Electrical stimulation (unattended), [phone]- Electrical stimulation (manual), [id number]- Ultrasound, 24401- Traction (mechanical), Patient/Family education, Balance training, Stair training, Taping, Dry Needling, Joint mobilization, Joint manipulation, Spinal manipulation, Spinal mobilization, Scar mobilization, Compression bandaging, Vestibular training, Visual/preceptual remediation/compensation, Cognitive remediation, DME instructions, Cryotherapy, Moist heat, and Biofeedback  PLAN FOR NEXT SESSION: sit to stands; try supported walking    [employer]. Fairly IV, PT, DPT Physical Therapist- [place]  [facility]  09/04/2023, 1:49 PM

## 2023-09-05 ENCOUNTER — Ambulatory Visit (INDEPENDENT_AMBULATORY_CARE_PROVIDER_SITE_OTHER): Payer: PPO | Admitting: Neurology

## 2023-09-05 ENCOUNTER — Encounter: Payer: Self-pay | Admitting: Neurology

## 2023-09-05 VITALS — BP 137/77 | HR 82 | Ht 71.0 in | Wt 250.0 lb

## 2023-09-05 DIAGNOSIS — R29898 Other symptoms and signs involving the musculoskeletal system: Secondary | ICD-10-CM | POA: Diagnosis not present

## 2023-09-05 DIAGNOSIS — R269 Unspecified abnormalities of gait and mobility: Secondary | ICD-10-CM

## 2023-09-05 DIAGNOSIS — Z79899 Other long term (current) drug therapy: Secondary | ICD-10-CM | POA: Diagnosis not present

## 2023-09-05 DIAGNOSIS — R7989 Other specified abnormal findings of blood chemistry: Secondary | ICD-10-CM

## 2023-09-05 DIAGNOSIS — G35 Multiple sclerosis: Secondary | ICD-10-CM

## 2023-09-05 DIAGNOSIS — R3915 Urgency of urination: Secondary | ICD-10-CM

## 2023-09-05 NOTE — Therapy (Signed)
 OUTPATIENT PHYSICAL THERAPY NEURO TREATMENT     Patient Name: Lisa Williams MRN: [id number] DOB:[birth year], 63 y.o., female Today's Date: 09/06/2023   PCP: Overton Blotter  REFERRING PROVIDER: Overton Blotter  END OF SESSION:  PT End of Session - 09/06/23 1016     Visit Number 16    Number of Visits 24    Date for PT Re-Evaluation 10/04/23    PT Start Time 1015    PT Stop Time 1055    PT Time Calculation (min) 40 min    Equipment Utilized During Treatment Gait belt    Activity Tolerance Patient tolerated treatment well;No increased pain    Behavior During Therapy [facility] for tasks assessed/performed                          Past Medical History:  Diagnosis Date   Abdominal pain, right upper quadrant    Back pain    Calculus of kidney 12/09/2013   Chronic back pain    unspecified   Chronic left shoulder pain 07/19/2015   Complication of anesthesia    Functional disorder of bladder    other   Galactorrhea 11/26/2014   Chronic    Hereditary and idiopathic neuropathy 08/19/2013   History of kidney stones    HPV test positive    Hypercholesteremia 08/19/2013   Hypertension    Incomplete bladder emptying    Microscopic hematuria    MS (multiple sclerosis) (HCC)    Muscle spasticity 05/21/2014   Nonspecific findings on examination of urine    other   Osteopenia    PONV (postoperative nausea and vomiting)    Status post laparoscopic supracervical hysterectomy 11/26/2014   Tobacco user 11/26/2014   Wrist fracture    Past Surgical History:  Procedure Laterality Date   bilateral tubal ligation  1996   BREAST CYST EXCISION Left 2002   CYST EXCISION Left 05/10/2022   Procedure: CYST REMOVAL;  Surgeon: Eldred Grego, MD;  Location: [facility] ORS;  Service: General;  Laterality: Left;   FRACTURE SURGERY     KNEE SURGERY     right   LAPAROSCOPIC SUPRACERVICAL HYSTERECTOMY  08/05/2013   ORIF WRIST FRACTURE Left 01/17/2017   Procedure:  OPEN REDUCTION INTERNAL FIXATION (ORIF) WRIST FRACTURE;  Surgeon: Jerlyn Moons, MD;  Location: [facility] ORS;  Service: Orthopedics;  Laterality: Left;   RADIOLOGY WITH ANESTHESIA N/A 03/18/2020   Procedure: MRI WITH ANESTHESIA CERVICAL SPINE AND BRAIN  WITH AND WITHOUT CONTRAST;  Surgeon: Radiologist, Medication, MD;  Location: [facility] OR;  Service: Radiology;  Laterality: N/A;   TUBAL LIGATION Bilateral    VAGINAL HYSTERECTOMY  03/2006   Patient Active Problem List   Diagnosis Date Noted   Sinus tachycardia 07/07/2023   Hypokalemia 07/29/2022   Weakness of both lower extremities 07/29/2022   Abscess of left groin 11/15/2021   Abnormal LFTs 11/15/2021   Acute respiratory disease due to COVID-19 virus 11/21/2020   Weakness    Hypoalbuminemia due to protein-calorie malnutrition [facility])    Neurogenic bowel    Neurogenic bladder    Labile blood pressure    Neuropathic pain    Abscess of female pelvis    SVT (supraventricular tachycardia) (HCC)    Radial styloid tenosynovitis 03/12/2018   Wheelchair dependence 02/27/2018   Localized osteoporosis with current pathological fracture with routine healing 01/19/2017   Wrist fracture 01/16/2017   Sprain of ankle 03/23/2016   Closed fracture of lateral malleolus 03/16/2016   Health  care maintenance 01/24/2016   Blood pressure elevated without history of HTN 10/25/2015   Essential hypertension 10/25/2015   Multiple sclerosis (HCC) 10/02/2015   Chronic left shoulder pain 07/19/2015   Multiple sclerosis exacerbation (HCC) 07/14/2015   MS (multiple sclerosis) (HCC) 11/26/2014   Increased body mass index 11/26/2014   HPV test positive 11/26/2014   Status post laparoscopic supracervical hysterectomy 11/26/2014   Galactorrhea 11/26/2014   Back ache 05/21/2014   Adiposity 05/21/2014   Disordered sleep 05/21/2014   Muscle spasticity 05/21/2014   Spasticity 05/21/2014   Calculus of kidney 12/09/2013   Renal colic 12/09/2013   Hypercholesteremia  08/19/2013   Hereditary and idiopathic neuropathy 08/19/2013   Hypercholesterolemia without hypertriglyceridemia 08/19/2013   Bladder infection, chronic 07/25/2012   Disorder of bladder function 07/25/2012   Incomplete bladder emptying 07/25/2012   Microscopic hematuria 07/25/2012   Right upper quadrant pain 07/25/2012    ONSET DATE: 1995  REFERRING DIAG: MS  THERAPY DIAG:  Muscle weakness (generalized)  Multiple sclerosis exacerbation (HCC)  Difficulty in walking, not elsewhere classified  Unsteadiness on feet  Rationale for Evaluation and Treatment: Rehabilitation  SUBJECTIVE:                                                                                                                                                                                             SUBJECTIVE STATEMENT: Patient reports she is tired but pleased with her ability to ambulate last session.  Pt accompanied by: self  PERTINENT HISTORY:  Patient is returning to PT s/p hospitalization.  s/p ORIF of R tibia shaft fracture 09/23/2022. Patient has weakness in BLE with RLE>LLE. She drives with hand controls. Patient has been diagnosed with MS in 1995. PMH includes: back pain, CBP, chronic L shoulder pain, galactorrhea, neuropathy, HPV, hypercholestermeia, HTN, MS, osteopenia, PONV, wrist fracture. Additional order for other closed fracture of proximal end of R tibia with routine healing. Still has to use [employer].   PAIN:  Are you having pain?  Occasional pain in RLE; primarily in knee  PRECAUTIONS: Fall  RED FLAGS: None   WEIGHT BEARING RESTRICTIONS: No  FALLS: Has patient fallen in last 6 months? No  LIVING ENVIRONMENT: Lives with: lives with their family Lives in: House/apartment Stairs:  ramp Has following equipment at home: Otho Blitz - 2 wheeled, Wheelchair (manual), shower chair, and Grab bars  PLOF: Independent with household mobility with device  PATIENT GOALS: to get her independence  back. To be able to get into car, toilet, and get dressed independently  OBJECTIVE:  Note: Objective measures were completed at Evaluation unless otherwise noted.  DIAGNOSTIC FINDINGS: MRI  of the brain 03/18/2020 showed multiple T2/FLAIR hyperintense foci in the periventricular, juxtacortical and deep white matter.  There were no infratentorial lesions noted.  None of the foci enhanced.   Due to severe claustrophobia, the study was done with conscious sedation in the hospital   COGNITION: Overall cognitive status: Within functional limits for tasks assessed   SENSATION: Lack of sensation in feet, loss in bilateral lateral aspect of knee  COORDINATION: Does not have the strength for functional LE heel slide test     MUSCLE TONE: BLE mild tone    POSTURE: rounded shoulders, forward head, anterior pelvic tilt, and weight shift left    LOWER EXTREMITY MMT:    MMT Right Eval Left Eval  Hip flexion 0.9 1.5  Hip extension    Hip abduction 1.8 2.6  Hip adduction 3.1 1.9  Hip internal rotation    Hip external rotation    Knee flexion 0.9 1.5  Knee extension 0.8 1.2  Ankle dorsiflexion    Ankle plantarflexion    Ankle inversion    Ankle eversion    (Blank rows = not tested)  BED MOBILITY:  Assess in future session due to limited time  TRANSFERS: Assistive device utilized:  Bariatric RW with sit to stands, slide board to table   Sit to stand:  unable to reach full stand Stand to sit:  unable to reach full stand  Chair to chair:  slide board with CGA     GAIT: Unable to ambulate at this time.   FUNCTIONAL TESTS:  Sit to stand: tricep press with BUE; unable to bring arm to walker.    Function In Sitting Test (FIST)  (1/2 femur on surface; hips/knees flexed to 90deg)   - indicate bed or mat table / step stool if used  SCORING KEY: 4 = Independent (completes task independently & successfully) 3 = Verbal Cues/Increased Time (completes task independently &  successfully and only needs more time/cues) 2 = Upper Extremity Support (must use UE for support or assistance to complete successfully) 1 = Needs Assistance (unable to complete w/o physical assist; DOCUMENT LEVEL: min, mod, max) 0 = Dependent (requires complete physical assist; unable to complete successfully even w/ physical assist)  Randomly Administer Once Throughout Exam  4 - Anterior Nudge (superior sternum)  4 - Posterior Nudge (between scapular spines)  4 - Lateral Nudge (to dominant side at acromion)     4 - Static sitting (30 seconds)  4 - Sitting, shake 'no' (left and right)  4 - Sitting, eyes closed (30 seconds)   0 - Sitting, lift foot (dominant side, lift foot 1 inch twice)    2 - Pick up object from behind (object at midline, hands breadth posterior)  3 - Forward reach (use dominant arm, must complete full motion) 2 - Lateral reach (use dominant arm, clear opposite ischial tuberosity) 2 - Pick up object from floor (from between feet)   2 - Posterior scooting (move backwards 2 inches)  2 - Anterior scooting (move forward 2 inches)  2 - Lateral scooting (move to dominant side 2 inches)    TOTAL = 39/56  Notes/comments: slide board tranfer to/from table   MCD > 5 points MCID for IP REHAB > 6 points  Function In Sitting Test (FIST) 08/14/23 (1/2 femur on surface; hips/knees flexed to 90deg)   - indicate bed or mat table / step stool if used  SCORING KEY: 4 = Independent (completes task independently & successfully) 3 = Verbal Cues/Increased  Time (completes task independently & successfully and only needs more time/cues) 2 = Upper Extremity Support (must use UE for support or assistance to complete successfully) 1 = Needs Assistance (unable to complete w/o physical assist; DOCUMENT LEVEL: min, mod, max) 0 = Dependent (requires complete physical assist; unable to complete successfully even w/ physical assist)  Randomly Administer Once Throughout Exam  4 - Anterior  Nudge (superior sternum)  4 - Posterior Nudge (between scapular spines)  4 - Lateral Nudge (to dominant side at acromion)     4 - Static sitting (30 seconds)  4 - Sitting, shake 'no' (left and right)  4 - Sitting, eyes closed (30 seconds)   0 - Sitting, lift foot (dominant side, lift foot 1 inch twice)    4 - Pick up object from behind (object at midline, hands breadth posterior)  4 - Forward reach (use dominant arm, must complete full motion) 4 - Lateral reach (use dominant arm, clear opposite ischial tuberosity) 2 - Pick up object from floor (from between feet)   3 - Posterior scooting (move backwards 2 inches)  3 - Anterior scooting (move forward 2 inches)  3 - Lateral scooting (move to dominant side 2 inches)    TOTAL = 47/56    MCD > 5 points MCID for IP REHAB > 6 points                                                                                                                              TREATMENT DATE: 09/06/23 TherAct:  All activities performed with gait belt and CGA unless otherwise noted. In // bars  Seated boxing: cues for sequencing, body mechanics, and pace/rhythm for functional contraction and timing of muscle recruitment: -Cross body punches to mitts on PT hands with no back support for focused core stabilization with crossing midline 2x 60 seconds  -Cross body punch with secondary combination of elbow for full cross body rotation to PT mitt for trunk stability, coordination, and cardiovascular challenge x 60 seconds -Upper cut to mitts in PT hands for core activation without back support with perturbations 60 seconds    LiteGait: -sit to stand in // bars x2 person CGA  to don harness -brought to hallway, stand x2 attempts with x2 person assistance with cues for bringing LE's back and squeezing glutes to stand tall.    PATIENT EDUCATION: Education details: goals, POC, HEP  Person educated: Patient Education method: Explanation, Demonstration, Tactile  cues, Verbal cues, and Handouts Education comprehension: verbalized understanding, returned demonstration, verbal cues required, tactile cues required, and needs further education  HOME EXERCISE PROGRAM: Stand 3x/day in stander   GOALS: Goals reviewed with patient? Yes  SHORT TERM GOALS: Target date: 08/08/2023    Patient will be independent in home exercise program to improve strength/mobility for better functional independence with ADLs.  Baseline: 4/8: compliance Goal status: MET    LONG TERM GOALS: Target date: 10/04/2023    Patient will  tolerate five consecutive stands with UE support from wheelchair to standing to improve functional mobility.  Baseline: unable to perform 4/8:unable to perform full stand Goal status: Ongoing   2.  Patient will ambulate 10 ft with bRW with wheelchair follow.  Baseline:  unable to ambulate 4/8: unable to ambulate Goal status: partially met  3.  Patient will improve FIST score >6 points to demonstrate improved stability and ability to perform ADLs.  Baseline:  3/6: 39/56 4/8: 47/ 56  Goal status: MET  4.  Patient will perform toileting with mod I at home for improved independence.  Baseline: 3/6: requires assistance 4/8: requires dependence on machine Goal status: Partially Met    ASSESSMENT:  CLINICAL IMPRESSION: Patient is challenged with sit to stands with Lite Gait due to posterior weight shift. She improves with repetition and multi modal cueing. Patient is highly motivated throughout session and tolerates core stabilization interventions and seated boxing with some fatigue  Patient will benefit from skilled physical therapy to improve mobility, stability, and independence.   OBJECTIVE IMPAIRMENTS: Abnormal gait, cardiopulmonary status limiting activity, decreased activity tolerance, decreased balance, decreased coordination, decreased endurance, decreased mobility, difficulty walking, decreased ROM, decreased strength, hypomobility,  increased fascial restrictions, impaired perceived functional ability, impaired flexibility, impaired sensation, improper body mechanics, and postural dysfunction.   ACTIVITY LIMITATIONS: carrying, lifting, bending, sitting, standing, squatting, sleeping, stairs, transfers, bed mobility, continence, bathing, toileting, dressing, self feeding, reach over head, hygiene/grooming, locomotion level, and caring for others  PARTICIPATION LIMITATIONS: meal prep, cleaning, laundry, interpersonal relationship, driving, shopping, community activity, and church  PERSONAL FACTORS: Age, Past/current experiences, Time since onset of injury/illness/exacerbation, Transportation, and 3+ comorbidities: ack pain, CBP, chronic L shoulder pain, galactorrhea, neuropathy, HPV, hypercholestermeia, HTN, MS, osteopenia, PONV, wrist fracture  are also affecting patient's functional outcome.   REHAB POTENTIAL: Good  CLINICAL DECISION MAKING: Evolving/moderate complexity  EVALUATION COMPLEXITY: Moderate  PLAN:  PT FREQUENCY: 2x/week  PT DURATION: 12 weeks  PLANNED INTERVENTIONS: 97164- PT Re-evaluation, 97110-Therapeutic exercises, 97530- Therapeutic activity, 97112- Neuromuscular re-education, 97535- Self Care, 09811- Manual therapy, [phone]- Gait training, [phone]- Orthotic Fit/training, [phone]- Canalith repositioning, [id number]- Aquatic Therapy, [phone]- Electrical stimulation (unattended), [phone]- Electrical stimulation (manual), [id number]- Ultrasound, [id number]- Traction (mechanical), Patient/Family education, Balance training, Stair training, Taping, Dry Needling, Joint mobilization, Joint manipulation, Spinal manipulation, Spinal mobilization, Scar mobilization, Compression bandaging, Vestibular training, Visual/preceptual remediation/compensation, Cognitive remediation, DME instructions, Cryotherapy, Moist heat, and Biofeedback  PLAN FOR NEXT SESSION: sit to stands; try supported walking   Lisa Williams  Brain Cahill, PT, DPT Physical Therapist -  [facility]  Outpatient Physical Therapy- Main Campus [phone]    09/06/2023, 11:04 AM

## 2023-09-05 NOTE — Progress Notes (Addendum)
 [facility]  PATIENT: Lisa Williams DOB: 1961/01/19  REFERRING DOCTOR OR PCP: Layman Piety, MD (PCP) SOURCE: Patient, notes from Dr. Jenel, imaging and lab reports, MRI images personally reviewed and summarized below.  _________________________________   HISTORICAL  CHIEF COMPLAINT:  Chief Complaint  Patient presents with   Follow-up    Pt in 11 alone Pt here for MS f/u Pt states MS doing well Pt states had surgery on right leg last year and currently in PT     HISTORY OF PRESENT ILLNESS:  Lisa Williams is a 63 y.o. woman with multiple sclerosis.   She is on Mayzent  and tolerates it well.  Update 09/05/2023 She feels stable for the most part.   She broke her leg last year in a fall and has not returned to the preaccident baseline.  She is doing therapy and notes improvement.  She can go about 15 feet with a walker but cannot take any steps without it.  She has weakness in both legs, R worse than L.   Her arms are strong.   She drives with hand controls x 10 years.     She has also lost 12 pounds on phentermine  but she had trouble sleeping so stopped.  Her insurance will not cover Wegovy.    Lisa Williams   LFT's have been elevated.  No jaundice, N/V.  She had severe constipation needing visit to hospital.    She has muscle spasms on the right, mostly in the leg.  Tizanidine  and baclofen  have helped. She takes 4 mg po tid  .She opted not to try dantrolene .  She denies sensory symptoms now but had some   She has urinary urgency helped by oxybutynin .   No recent UTI.  Vision is doing well.  She has never had neuritis.     She has fatigue, worse with heat.  She sleeps well most nights.   Mood and cognition are fine.      MS History: She was diagnosed in 1995 after presenting with right leg weakness and foot drop.   One day, she lost all sensation in her legs.   She saw Dr. Gretta in [place] until his retirement.   She then saw Dr. Lane in [place]  and she started to see Dr. Jenel 2 years ago.     She was initially placed on Copaxone x 3 years and then was off all DMTs for many years and then started Rituxan  around 2018 to 2019.  At that time, associated with a UTI, she felt much more weak prompting her to go on a medication.   She started Mayzent  since 2020.   She has tolerated it well.      IMAGING: MRI of the brain 03/18/2020 showed multiple T2/FLAIR hyperintense foci in the periventricular, juxtacortical and deep white matter.  There were no infratentorial lesions noted.  None of the foci enhanced.   Due to severe claustrophobia, the study was done with conscious sedation in the hospital.    MRI of the cervical spine 03/18/2020 showed T2 hyperintense foci at C1-C2, C2-C3, C4-C5, C5-C6 and T1.  There is a disc protrusion at C5-C6 to the left and a larger disc protrusion at C6-C7 to the right that distorts the thecal sac.  The adjacent spinal cord has normal signal.  There could be compression of the right C7 nerve root.     REVIEW OF SYSTEMS: Constitutional: No fevers, chills, sweats, or change in appetite Eyes: No visual changes, double  vision, eye pain Ear, nose and throat: No hearing loss, ear pain, nasal congestion, sore throat Cardiovascular: No chest pain, palpitations Respiratory:  No shortness of breath at rest or with exertion.   No wheezes GastrointestinaI: No nausea, vomiting, diarrhea, abdominal pain, fecal incontinence Genitourinary:  No dysuria, urinary retention or frequency.  No nocturia. Musculoskeletal:  No neck pain, back pain Integumentary: No rash, pruritus, skin lesions Neurological: as above Psychiatric: No depression at this time.  No anxiety Endocrine: No palpitations, diaphoresis, change in appetite, change in weigh or increased thirst Hematologic/Lymphatic:  No anemia, purpura, petechiae. Allergic/Immunologic: No itchy/runny eyes, nasal congestion, recent allergic reactions, rashes  ALLERGIES: Allergies   Allergen Reactions   Amlodipine  Swelling   Betadine [Povidone-Iodine]    Povidone Iodine Rash    HOME MEDICATIONS:  Current Outpatient Medications:    acetaminophen  (TYLENOL ) 500 MG tablet, Take 500-1,000 mg by mouth every 6 (six) hours as needed for moderate pain or headache., Disp: , Rfl:    baclofen  (LIORESAL ) 20 MG tablet, Take 1 tablet (20 mg total) by mouth 3 (three) times daily., Disp: 270 each, Rfl: 3   Cholecalciferol  25 MCG (1000 UT) tablet, Take 5,000 Units by mouth daily., Disp: , Rfl:    gabapentin  (NEURONTIN ) 100 MG capsule, Take 1 capsule (100 mg total) by mouth 2 (two) times daily., Disp: 180 capsule, Rfl: 3   oxybutynin  (DITROPAN -XL) 10 MG 24 hr tablet, Take 1 tablet (10 mg total) by mouth daily., Disp: 90 tablet, Rfl: 3   polyethylene glycol (MIRALAX ) 17 g packet, Take 17 g by mouth 2 (two) times daily. (Patient taking differently: Take 17 g by mouth as needed.), Disp: , Rfl:    Siponimod  Fumarate (MAYZENT ) 2 MG TABS, Take 1 tablet (2 mg total) by mouth daily., Disp: 30 tablet, Rfl: 11   telmisartan (MICARDIS) 40 MG tablet, Take 40 mg by mouth daily., Disp: , Rfl:    tiZANidine  (ZANAFLEX ) 4 MG tablet, TAKE ONE BY MOUTH EVERY MORNING ONE BY MOUTH EVERY EVENING AND TWO BY MOUTH AT BEDTIME, Disp: 120 tablet, Rfl: 0  PAST MEDICAL HISTORY: Past Medical History:  Diagnosis Date   Abdominal pain, right upper quadrant    Back pain    Calculus of kidney 12/09/2013   Chronic back pain    unspecified   Chronic left shoulder pain 07/19/2015   Complication of anesthesia    Functional disorder of bladder    other   Galactorrhea 11/26/2014   Chronic    Hereditary and idiopathic neuropathy 08/19/2013   History of kidney stones    HPV test positive    Hypercholesteremia 08/19/2013   Hypertension    Incomplete bladder emptying    Microscopic hematuria    MS (multiple sclerosis) (HCC)    Muscle spasticity 05/21/2014   Nonspecific findings on examination of urine    other    Osteopenia    PONV (postoperative nausea and vomiting)    Status post laparoscopic supracervical hysterectomy 11/26/2014   Tobacco user 11/26/2014   Wrist fracture     PAST SURGICAL HISTORY: Past Surgical History:  Procedure Laterality Date   bilateral tubal ligation  1996   BREAST CYST EXCISION Left 2002   CYST EXCISION Left 05/10/2022   Procedure: CYST REMOVAL;  Surgeon: Rodolph Romano, MD;  Location: [facility] ORS;  Service: General;  Laterality: Left;   FRACTURE SURGERY     KNEE SURGERY     right   LAPAROSCOPIC SUPRACERVICAL HYSTERECTOMY  08/05/2013   ORIF WRIST FRACTURE Left  01/17/2017   Procedure: OPEN REDUCTION INTERNAL FIXATION (ORIF) WRIST FRACTURE;  Surgeon: Leora Lynwood SAUNDERS, MD;  Location: [facility] OR[facility];  Service: Orthopedics;  Laterality: Left;   RADIOLOGY WITH ANESTHESIA N/A 03/18/2020   Procedure: MRI WITH ANESTHESIA CERVICAL SPINE AND BRAIN  WITH AND WITHOUT CONTRAST;  Surgeon: Radiologist, Medication, MD;  Location: [facility] OR;  Service: Radiology;  Laterality: N/A;   TUBAL LIGATION Bilateral    VAGINAL HYSTERECTOMY  03/2006    FAMILY HISTORY: Family History  Problem Relation Age of Onset   Osteoarthritis Mother    Hypertension Mother    Hyperlipidemia Mother    Nephrolithiasis Father    Hypertension Father    Prostate cancer Father    Kidney disease Father    Heart failure Father    Sickle cell trait Sister    Hypertension Sister    Supraventricular tachycardia Sister    Hypertension Sister    Hypertension Sister    Diabetes Maternal Aunt    Breast cancer Maternal Aunt        great MAT   Diabetes Maternal Grandmother    Liver cancer Maternal Grandmother    Ovarian cancer Paternal Grandmother    Multiple sclerosis Cousin    GU problems Neg Hx    Urolithiasis Neg Hx     SOCIAL HISTORY:  Social History   Socioeconomic History   Marital status: Single    Spouse name: Not on file   Number of children: 0   Years of education: 14   Highest education  level: Not on file  Occupational History   Not on file  Tobacco Use   Smoking status: Former    Current packs/day: 0.50    Types: Cigarettes   Smokeless tobacco: Never  Vaping Use   Vaping status: Never Used  Substance and Sexual Activity   Alcohol use: No    Alcohol/week: 0.0 standard drinks of alcohol   Drug use: No   Sexual activity: Not Currently    Birth control/protection: Surgical  Other Topics Concern   Not on file  Social History Narrative   Lives at home with mom and sister,. Has a walker for ambulation/uses WC when necessary   Right Handed   Drinks no caffeine   Pt not working    Teacher, early years/pre Strain: Low Risk  (07/24/2023)   Received from [facility]   Overall Financial Resource Strain (CARDIA)    Difficulty of Paying Living Expenses: Not hard at all  Food Insecurity: No Food Insecurity (07/24/2023)   Received from [facility]   Hunger Vital Sign    Worried About Running Out of Food in the Last Year: Never true    Ran Out of Food in the Last Year: Never true  Transportation Needs: No Transportation Needs (07/24/2023)   Received from [facility] - Transportation    In the past 12 months, has lack of transportation kept you from medical appointments or from getting medications?: No    Lack of Transportation (Non-Medical): No  Physical Activity: Not on file  Stress: Not on file  Social Connections: Not on file  Intimate Partner Violence: Not At Risk (07/09/2023)   Humiliation, Afraid, Rape, and Kick questionnaire    Fear of Current or Ex-Partner: No    Emotionally Abused: No    Physically Abused: No    Sexually Abused: No     PHYSICAL EXAM  Vitals:   09/05/23 1056  BP: 137/77  Pulse: 82  Weight: 250 lb (113.4 kg)  Height: 5' 11 (1.803 m)    Body mass index is 34.87 kg/m.   General: The patient is well-developed and well-nourished and in no acute  distress  HEENT:  Head is [place]/AT.  Sclera are anicteric.    Skin: Extremities are without rash or  edema.  Musculoskeletal:  Back is nontender  Neurologic Exam  Mental status: The patient is alert and oriented x 3 at the time of the examination. The patient has apparent normal recent and remote memory, with an apparently normal attention span and concentration ability.   Speech is normal.  Cranial nerves: Extraocular movements are full. Facial strength and sensation are normal.  No obvious hearing deficits are noted.  Motor:  Muscle bulk is normal.   Tone is increased in the legs, right slightly more than left but better than last visit.  Strength was 2/5 in the hip flexors and 43 to 4-/5 in the knee extensors, right slightly weaker than left.  Knee flexors were 4-/5, the distal legs are 3-4/5.  Strength was 5/5 in the arms except 4+/5 left ulnar innervated hand muscles  Sensory: Sensory testing showed mildly reduced touch and vibration sensation in the right compared to the left..  Coordination: Cerebellar testing reveals good finger-nose-finger.  Cannot do Heel-to-shin.  Gait and station: Due to weakness, gait not tested  Reflexes: Deep tendon reflexes are symmetric and normal bilaterally.       DIAGNOSTIC DATA (LABS, IMAGING, TESTING) - I reviewed patient records, labs, notes, testing and imaging myself where available.  Lab Results  Component Value Date   WBC 5.6 07/08/2023   HGB 13.7 07/08/2023   HCT 39.4 07/08/2023   MCV 76.4 (L) 07/08/2023   PLT 239 07/08/2023      Component Value Date/Time   NA 138 07/08/2023 0620   NA 143 03/03/2021 1128   NA 139 02/15/2013 1700   K 3.8 07/08/2023 0620   K 3.8 02/15/2013 1700   CL 108 07/08/2023 0620   CL 109 (H) 02/15/2013 1700   CO2 22 07/08/2023 0620   CO2 25 02/15/2013 1700   GLUCOSE 117 (H) 07/08/2023 0620   GLUCOSE 94 02/15/2013 1700   BUN 31 (H) 07/08/2023 0620   BUN 21 03/03/2021 1128   BUN 20 (H) 02/15/2013 1700    CREATININE 0.71 07/08/2023 0620   CREATININE 0.59 (L) 02/15/2013 1700   CALCIUM  8.8 (L) 07/08/2023 0620   CALCIUM  9.0 02/15/2013 1700   PROT 7.1 07/08/2023 0620   PROT 6.5 03/03/2021 1128   PROT 7.2 02/15/2013 1700   ALBUMIN 3.6 07/08/2023 0620   ALBUMIN 4.5 03/03/2021 1128   ALBUMIN 3.6 02/15/2013 1700   AST 50 (H) 07/08/2023 0620   AST 20 02/15/2013 1700   ALT 99 (H) 07/08/2023 0620   ALT 15 02/15/2013 1700   ALKPHOS 153 (H) 07/08/2023 0620   ALKPHOS 127 02/15/2013 1700   BILITOT 1.1 07/08/2023 0620   BILITOT 0.7 03/03/2021 1128   BILITOT 0.3 02/15/2013 1700   GFRNONAA >60 07/08/2023 0620   GFRNONAA >60 02/15/2013 1700   GFRAA 110 05/13/2020 1146   GFRAA >60 02/15/2013 1700   Lab Results  Component Value Date   CHOL 187 11/29/2016   HDL 55 11/29/2016   LDLCALC 119 (H) 11/29/2016   TRIG 64 11/29/2016   CHOLHDL 3.4 11/29/2016   Lab Results  Component Value Date   HGBA1C 5.1 11/29/2016   No results found for:  [id number] Lab Results  Component Value Date   TSH 0.412 11/26/2020       ASSESSMENT AND PLAN  Multiple sclerosis (HCC) - Plan: CBC with Differential/Platelet, Hepatitis B surface antigen, Hepatitis B Core AB, Total, Hepatitis B surface antibody,qualitative, Hepatitis C antibody, CANCELED: Hepatitis Acute Panel  Abnormal LFTs - Plan: Hepatitis B surface antigen, Hepatitis B Core AB, Total, Hepatitis B surface antibody,qualitative, Hepatitis C antibody, CANCELED: Hepatitis Acute Panel  High risk medication use - Plan: CBC with Differential/Platelet, Hepatitis B surface antigen, Hepatitis B Core AB, Total, Hepatitis B surface antibody,qualitative, Hepatitis C antibody, CANCELED: Hepatitis Acute Panel  Gait disturbance  Weakness of both lower extremities  Urinary urgency    She is on Mayzent  therapy and tolerates it well.  We will check cbc/diff.   LFT's are elevated.   Likely not Mayzent  (was on Mayzent  > 1 year before elevation)  Will check Hepatitis  labs.  She may need U/S or other tests.  Advised to f/u with PCP.  We could consider stopping the Mayzent .  If he did so I would consider Mavenclad as another option to make a switch to the liver issue resolved Continue baclofen  and tizanidine  for spasticity.  Gabapentin  for dysesthesias. Stay active and exercise as tolerated. 4.   Continue PT to help with recovery from recent fracture/deconditioning.    She would medically benefit from a standing frame to help leg strength and conditioning.   5.   Due to the need for frequent repositioning while in bed, it is medically necessary for her to get a hospital bed with mattress and short rails.   Return in 6 months or sooner if there are new or worsening neurologic symptoms.   This visit is part of a comprehensive longitudinal care medical relationship regarding the patients primary diagnosis of MS and related concerns.   Laisha Rau A. Vear, MD, [facility] 09/05/2023, 12:25 PM Certified in Neurology, Clinical Neurophysiology, Sleep Medicine and Neuroimaging  [facility] [address] [place], [place] [address] [phone]

## 2023-09-05 NOTE — Therapy (Signed)
 OUTPATIENT OCCUPATIONAL THERAPY NEURO TREATMENT NOTE   Patient Name: Lisa Williams MRN: [id number] DOB:May 15, 1960, 63 y.o., female Today's Date: 09/05/2023  PCP: Lisa Williams   Neurologist Lisa Williams at [facility] REFERRING PROVIDER: Dr. Overton Williams    END OF SESSION:  OT End of Session - 09/05/23 1427     Visit Number 5    Number of Visits 24    Date for OT Re-Evaluation 11/08/23    Progress Note Due on Visit 10    OT Start Time 1145    OT Stop Time 1225    OT Time Calculation (min) 40 min    Equipment Utilized During Treatment manual wc    Activity Tolerance Patient tolerated treatment well    Behavior During Therapy Lisa Williams for tasks assessed/performed            Past Medical History:  Diagnosis Date   Abdominal pain, right upper quadrant    Back pain    Calculus of kidney 12/09/2013   Chronic back pain    unspecified   Chronic left shoulder pain 07/19/2015   Complication of anesthesia    Functional disorder of bladder    other   Galactorrhea 11/26/2014   Chronic    Hereditary and idiopathic neuropathy 08/19/2013   History of kidney stones    HPV test positive    Hypercholesteremia 08/19/2013   Hypertension    Incomplete bladder emptying    Microscopic hematuria    MS (multiple sclerosis) (HCC)    Muscle spasticity 05/21/2014   Nonspecific findings on examination of urine    other   Osteopenia    PONV (postoperative nausea and vomiting)    Status post laparoscopic supracervical hysterectomy 11/26/2014   Tobacco user 11/26/2014   Wrist fracture    Past Surgical History:  Procedure Laterality Date   bilateral tubal ligation  1996   BREAST CYST EXCISION Left 2002   CYST EXCISION Left 05/10/2022   Procedure: CYST REMOVAL;  Surgeon: Lisa Grego, MD;  Location: [facility];  Service: General;  Laterality: Left;   FRACTURE SURGERY     KNEE SURGERY     right   LAPAROSCOPIC SUPRACERVICAL HYSTERECTOMY  08/05/2013   ORIF WRIST FRACTURE  Left 01/17/2017   Procedure: OPEN REDUCTION INTERNAL FIXATION (ORIF) WRIST FRACTURE;  Surgeon: Lisa Moons, MD;  Location: [facility];  Service: Orthopedics;  Laterality: Left;   RADIOLOGY WITH ANESTHESIA N/A 03/18/2020   Procedure: MRI WITH ANESTHESIA CERVICAL SPINE AND BRAIN  WITH AND WITHOUT CONTRAST;  Surgeon: Radiologist, Medication, MD;  Location: [facility] OR;  Service: Radiology;  Laterality: N/A;   TUBAL LIGATION Bilateral    VAGINAL HYSTERECTOMY  03/2006   Patient Active Problem List   Diagnosis Date Noted   Sinus tachycardia 07/07/2023   Hypokalemia 07/29/2022   Weakness of both lower extremities 07/29/2022   Abscess of left groin 11/15/2021   Abnormal LFTs 11/15/2021   Acute respiratory disease due to COVID-19 virus 11/21/2020   Weakness    Hypoalbuminemia due to protein-calorie malnutrition [facility])    Neurogenic bowel    Neurogenic bladder    Labile blood pressure    Neuropathic pain    Abscess of female pelvis    SVT (supraventricular tachycardia) (HCC)    Radial styloid tenosynovitis 03/12/2018   Wheelchair dependence 02/27/2018   Localized osteoporosis with current pathological fracture with routine healing 01/19/2017   Wrist fracture 01/16/2017   Sprain of ankle 03/23/2016   Closed fracture of lateral malleolus 03/16/2016  Health care maintenance 01/24/2016   Blood pressure elevated without history of HTN 10/25/2015   Essential hypertension 10/25/2015   Multiple sclerosis (HCC) 10/02/2015   Chronic left shoulder pain 07/19/2015   Multiple sclerosis exacerbation (HCC) 07/14/2015   MS (multiple sclerosis) (HCC) 11/26/2014   Increased body mass index 11/26/2014   HPV test positive 11/26/2014   Status post laparoscopic supracervical hysterectomy 11/26/2014   Galactorrhea 11/26/2014   Back ache 05/21/2014   Adiposity 05/21/2014   Disordered sleep 05/21/2014   Muscle spasticity 05/21/2014   Spasticity 05/21/2014   Calculus of kidney 12/09/2013   Renal colic  12/09/2013   Hypercholesteremia 08/19/2013   Hereditary and idiopathic neuropathy 08/19/2013   Hypercholesterolemia without hypertriglyceridemia 08/19/2013   Bladder infection, chronic 07/25/2012   Disorder of bladder function 07/25/2012   Incomplete bladder emptying 07/25/2012   Microscopic hematuria 07/25/2012   Right upper quadrant pain 07/25/2012   ONSET DATE: 5/24 Lisa Williams and broke R leg which caused beginning of ADL decline; diagnosed with MS in 1995)  REFERRING DIAG: MS  THERAPY DIAG:  Muscle weakness (generalized)  Other lack of coordination  Multiple sclerosis exacerbation (HCC)  Rationale for Evaluation and Treatment: Rehabilitation  SUBJECTIVE:  SUBJECTIVE STATEMENT: Pt reports she stayed busy baking over the weekend. Pt accompanied by: self  PERTINENT HISTORY: Pt reports increased difficulty with basic self care tasks since breaking her R leg last May.  Since that time, mobility and ADLs have declined, with pt requiring assist from private caregivers and family to manage bathing, toileting, dressing, and functional transfers.  PRECAUTIONS: Fall  WEIGHT BEARING RESTRICTIONS: No  PAIN:  Are you having pain? No; occasional pain in R lower back, but not today  FALLS: Has patient fallen in last 6 months? No Last fall was May 17 of 2024  LIVING ENVIRONMENT: Lives with: lives with their family (including mother who has dementia and sister Lisa Williams)  Lives in: 2 level home but pt resides on main level Stairs: ramp  Has following equipment at home: Wheelchair (manual), Shower bench, and hand held shower shower, 1 grab bar in the shower,  3in1 commode, sit to stand lift (electric), FWW, hospital bed  PLOF: modified indep with ADL/IADLs prior to May of 6213   PATIENT GOALS: Increase independence with basic self care tasks  OBJECTIVE:  Note: Objective measures were completed at Evaluation unless otherwise noted.  HAND DOMINANCE: Right  ADLs:  Overall ADLs: assist  provided from paid caregiver and sister Transfers/ambulation related to ADLs: sit to stand lift for all transfers, set up for sliding board in/out of bed  Eating: indep  Grooming: modified indep (wc level in mom's bathroom)  UB Dressing: set up (can't access her bedroom closet)  LB Dressing: able to sit on side of bed to don all LB clothing; assist to hike pants/underwear Toileting: assist with clothing management d/t standing within electric lift Bathing: bed bath with sister helping to wash backside Tub Shower transfers: N/A; pt has walk in shower in mom's bathroom (wc does fit in this bathroom but pt reports inability to transfer from wc and requires arm rests on a bench for a successful transfer attempt from any DME).  Tub bench is in pt's bathroom, but lift does not fit through bathroom doorway and pt reports inability to transfer up from tub bench (currently unable to manage either transfer)   Equipment:  see above  IADLs: Shopping: [employer] for shopping Light housekeeping: modified indep from wc level Meal Prep: modified indep from wc  level Community mobility: relies on community transportation or family members Medication management: indep Landscape architect: indep Handwriting:  NT; pt denies any [facility] challenges  MOBILITY STATUS: Hx of falls  POSTURE COMMENTS:  Rounded shoulders, forward head, anterior pelvic tilt, and weight shift left   ACTIVITY TOLERANCE: Activity tolerance: Per PT; currently standing 30-60 sec within Light Gait harness/lift; currently non-ambulatory  UPPER EXTREMITY ROM:  BUEs WFL  UPPER EXTREMITY MMT:     MMT Right eval Left eval  Shoulder flexion 4+ 4  Shoulder abduction 4+ 4  Shoulder adduction    Shoulder extension    Shoulder internal rotation 4+ 4+  Shoulder external rotation 4 4  Middle trapezius    Lower trapezius    Elbow flexion 4+ 4+  Elbow extension 4+ 4+  Wrist flexion 4+ 4+  Wrist extension 4+ 4+  Wrist ulnar deviation     Wrist radial deviation    Wrist pronation    Wrist supination    (Blank rows = not tested)  HAND FUNCTION/COORDINATION:  R/L WNL; pt denies any coordination deficits/ able to manipulate pills/clothing fasteners/etc without difficulty  SENSATION: WFL  EDEMA: No visible edema in BUEs  MUSCLE TONE: BUEs WNL  COGNITION: Overall cognitive status: Within functional limits for tasks assessed  VISION: wears glasses all the time, no reports of diplopia  PERCEPTION: Not tested  PRAXIS: WFL  OBSERVATIONS:  Pt pleasant, cooperative, and motivated to work towards improving indep with ADLs.                                                                                                     TREATMENT DATE: 09/04/23 Self Care: -scoot pivot wc<>toilet transfer at commode height 18.25" (handicapped bathroom stall outside cafeteria), no sliding board used but use of grab bar beside toilet and min guard both directions; assist for wc positioning/LE adjustments pre/post transfer.   -positioned self facing grab bar and performed trials of anterior WS to clear buttocks from wc in prep for scoot pivot.  OT advised that allowing at least slight clearance of buttocks during transfer will ease transfer and prevent pt from sitting on wc brake during her scoot to/from the toilet to the wc. -completed L lateral leans in wc with R arm rest removed, using RUE to reach beneath buttocks to simulate pericare on commode. -Simulated a brief change by threading Les into looped theraband and practiced hiking to knee level; used reacher to set up and position "brief" at floor level while stepping Les into looped band (mod A with shoes on)  Therapeutic Activity: -Trials of anterior WS with triceps extension to practice clearing buttocks from wc cushion in prep for transfers and lifting to allow caregiver to hike pants.   -Multimodal cueing for achieving more anterior WS and activating quads to assist with lifting rather  than relying solely on BUEs pushing into arm rests of wc.  PATIENT EDUCATION: Education details: toilet transfer Person educated: Patient Education method: Explanation, demo Education comprehension: verbalized understanding, demonstrating understanding; further training needed  HOME EXERCISE PROGRAM: IR A/AAROM to promote shoulder flexibility for  peri care/bathing  GOALS: Goals reviewed with patient? Yes  SHORT TERM GOALS: Target date: 09/27/23  Pt will perform bed bed bath with set up.  Baseline: Eval: Min-mod A for posterior washing Goal status: INITIAL  2.  Pt will utilize sliding board for wc<>drop arm commode transfer with SBA. Baseline: Eval: Currently using electric lift for transfer to [facility] Goal status: INITIAL  3.  Pt will be indep to perform HEP for maintaining BUE strength for ADLs and functional transfers. Baseline: Eval: HEP not yet initiated Goal status: INITIAL  LONG TERM GOALS: Target date: 11/08/23  Pt will perform sink bath with min A. Baseline: Eval: Currently bed bathing d/t inability to stand at sink without electric lift (lift does not fit into pt's bathroom).   Goal status: INITIAL  2.  Pt will perform wc<>tub bench transfer with min A. Baseline: Eval: Unable Goal status: INITIAL  3.  Pt will perform squat pivot transfer wc<>BSC with SBA. Baseline: Eval: Eval: Currently using electric lift for transfer to [facility] Goal status: INITIAL  4. Pt will perform scoot transfer wc<>toilet using grab bars to allow toileting in community setting.  Baseline: Eval: Pt limits community outings d/t requiring use of lift for [facility] transfers.   Goal status: INITIAL  5.  Pt will tolerate standing x1 min to manage clothing in prep for toileting. Baseline: Eval: Currently standing in electric lift only, or within parallel bars without lift Goal status: INITIAL   ASSESSMENT: CLINICAL IMPRESSION: Pt able to complete a successful scoot transfer wc<>handicapped toilet stall  (18.25" tall) without use of sliding board, but OT assist for wc positioning and min guard for safety and assist to adjust BLEs pre/post transfer.  Transfers require a lot of effort and rest breaks after each transition d/t generalized weakness.  Continued focus on clothing and peri care simulation.  Pt reports that she ordered a bottom buddy which should arrive this week.  Pt plans to wear her biker shorts next session beneath her pants to enable practice with lowering/hiking pants in clinic next session.  Pt will continue to benefit from skilled OT to work towards above noted goals in OT poc, working to maximize indep with daily tasks while reducing burden of care on caregivers.   PERFORMANCE DEFICITS: in functional skills including ADLs, IADLs, strength, pain, flexibility, Gross motor control, mobility, balance, body mechanics, endurance, and decreased knowledge of use of DME, and psychosocial skills including coping strategies, environmental adaptation, habits, and routines and behaviors.   IMPAIRMENTS: are limiting patient from ADLs, IADLs, and social participation.   CO-MORBIDITIES: has co-morbidities such as neuropathy, back pain, obesity, MS, HTN  that affects occupational performance. Patient will benefit from skilled OT to address above impairments and improve overall function.  MODIFICATION OR ASSISTANCE TO COMPLETE EVALUATION: No modification of tasks or assist necessary to complete an evaluation.  OT OCCUPATIONAL PROFILE AND HISTORY: Detailed assessment: Review of records and additional review of physical, cognitive, psychosocial history related to current functional performance.  CLINICAL DECISION MAKING: Moderate - several treatment options, min-mod task modification necessary  REHAB POTENTIAL: Good  EVALUATION COMPLEXITY: Moderate    PLAN:  OT FREQUENCY: 2x/week  OT DURATION: 12 weeks  PLANNED INTERVENTIONS: 97168 OT Re-evaluation, 97535 self care/ADL training, 09811  therapeutic exercise, 97530 therapeutic activity, 97112 neuromuscular re-education, 97140 manual therapy, 97116 gait training, 91478 moist heat, 97010 cryotherapy, balance training, functional mobility training, psychosocial skills training, energy conservation, coping strategies training, patient/family education, and DME and/or AE instructions  RECOMMENDED OTHER  SERVICES: None at this time (Pt currently receiving PT services in this clinic)  CONSULTED AND AGREED WITH PLAN OF CARE: Patient  PLAN FOR NEXT SESSION: see above  Marcus Sewer, MS, OTR/L   Casandra Claw, OT 09/05/2023, 2:28 PM

## 2023-09-06 ENCOUNTER — Ambulatory Visit: Payer: PPO | Attending: Neurology

## 2023-09-06 ENCOUNTER — Encounter

## 2023-09-06 ENCOUNTER — Encounter: Payer: Self-pay | Admitting: Neurology

## 2023-09-06 DIAGNOSIS — R2689 Other abnormalities of gait and mobility: Secondary | ICD-10-CM | POA: Diagnosis not present

## 2023-09-06 DIAGNOSIS — R278 Other lack of coordination: Secondary | ICD-10-CM | POA: Insufficient documentation

## 2023-09-06 DIAGNOSIS — M6281 Muscle weakness (generalized): Secondary | ICD-10-CM | POA: Diagnosis not present

## 2023-09-06 DIAGNOSIS — R2681 Unsteadiness on feet: Secondary | ICD-10-CM | POA: Insufficient documentation

## 2023-09-06 DIAGNOSIS — R262 Difficulty in walking, not elsewhere classified: Secondary | ICD-10-CM | POA: Insufficient documentation

## 2023-09-06 DIAGNOSIS — G35 Multiple sclerosis: Secondary | ICD-10-CM | POA: Insufficient documentation

## 2023-09-06 LAB — CBC WITH DIFFERENTIAL/PLATELET
Basophils Absolute: 0 10*3/uL (ref 0.0–0.2)
Basos: 1 %
EOS (ABSOLUTE): 0.1 10*3/uL (ref 0.0–0.4)
Eos: 2 %
Hematocrit: 41.8 % (ref 34.0–46.6)
Hemoglobin: 13.6 g/dL (ref 11.1–15.9)
Immature Grans (Abs): 0 10*3/uL (ref 0.0–0.1)
Immature Granulocytes: 0 %
Lymphocytes Absolute: 0.3 10*3/uL — ABNORMAL LOW (ref 0.7–3.1)
Lymphs: 6 %
MCH: 26.9 pg (ref 26.6–33.0)
MCHC: 32.5 g/dL (ref 31.5–35.7)
MCV: 83 fL (ref 79–97)
Monocytes Absolute: 0.5 10*3/uL (ref 0.1–0.9)
Monocytes: 8 %
Neutrophils Absolute: 4.8 10*3/uL (ref 1.4–7.0)
Neutrophils: 83 %
Platelets: 248 10*3/uL (ref 150–450)
RBC: 5.05 x10E6/uL (ref 3.77–5.28)
RDW: 14.6 % (ref 11.7–15.4)
WBC: 5.7 10*3/uL (ref 3.4–10.8)

## 2023-09-06 LAB — HEPATITIS B CORE ANTIBODY, TOTAL: Hep B Core Total Ab: NEGATIVE

## 2023-09-06 LAB — HEPATITIS B SURFACE ANTIGEN: Hepatitis B Surface Ag: NEGATIVE

## 2023-09-06 LAB — HEPATITIS C ANTIBODY: Hep C Virus Ab: NONREACTIVE

## 2023-09-06 LAB — HEPATITIS B SURFACE ANTIBODY,QUALITATIVE: Hep B Surface Ab, Qual: NONREACTIVE

## 2023-09-07 ENCOUNTER — Ambulatory Visit

## 2023-09-07 DIAGNOSIS — G35 Multiple sclerosis: Secondary | ICD-10-CM

## 2023-09-07 DIAGNOSIS — M6281 Muscle weakness (generalized): Secondary | ICD-10-CM | POA: Diagnosis not present

## 2023-09-09 NOTE — Therapy (Signed)
 OUTPATIENT OCCUPATIONAL THERAPY NEURO TREATMENT NOTE   Patient Name: Lisa Williams MRN: [id number] DOB:Mar 18, 1961, 63 y.o., female Today's Date: 09/09/2023  PCP: Dr. Overton Blotter   Neurologist Dr. Octavio Ben at [facility] REFERRING PROVIDER: Dr. Overton Blotter    END OF SESSION:  OT End of Session - 09/09/23 1512     Visit Number 6    Number of Visits 24    Date for OT Re-Evaluation 11/08/23    Progress Note Due on Visit 10    OT Start Time 1015    OT Stop Time 1100    OT Time Calculation (min) 45 min    Equipment Utilized During Treatment manual wc    Activity Tolerance Patient tolerated treatment well    Behavior During Therapy [facility] for tasks assessed/performed            Past Medical History:  Diagnosis Date   Abdominal pain, right upper quadrant    Back pain    Calculus of kidney 12/09/2013   Chronic back pain    unspecified   Chronic left shoulder pain 07/19/2015   Complication of anesthesia    Functional disorder of bladder    other   Galactorrhea 11/26/2014   Chronic    Hereditary and idiopathic neuropathy 08/19/2013   History of kidney stones    HPV test positive    Hypercholesteremia 08/19/2013   Hypertension    Incomplete bladder emptying    Microscopic hematuria    MS (multiple sclerosis) (HCC)    Muscle spasticity 05/21/2014   Nonspecific findings on examination of urine    other   Osteopenia    PONV (postoperative nausea and vomiting)    Status post laparoscopic supracervical hysterectomy 11/26/2014   Tobacco user 11/26/2014   Wrist fracture    Past Surgical History:  Procedure Laterality Date   bilateral tubal ligation  1996   BREAST CYST EXCISION Left 2002   CYST EXCISION Left 05/10/2022   Procedure: CYST REMOVAL;  Surgeon: Eldred Grego, MD;  Location: [facility] ORS;  Service: General;  Laterality: Left;   FRACTURE SURGERY     KNEE SURGERY     right   LAPAROSCOPIC SUPRACERVICAL HYSTERECTOMY  08/05/2013   ORIF WRIST FRACTURE  Left 01/17/2017   Procedure: OPEN REDUCTION INTERNAL FIXATION (ORIF) WRIST FRACTURE;  Surgeon: Jerlyn Moons, MD;  Location: [facility] ORS;  Service: Orthopedics;  Laterality: Left;   RADIOLOGY WITH ANESTHESIA N/A 03/18/2020   Procedure: MRI WITH ANESTHESIA CERVICAL SPINE AND BRAIN  WITH AND WITHOUT CONTRAST;  Surgeon: Radiologist, Medication, MD;  Location: [facility] OR;  Service: Radiology;  Laterality: N/A;   TUBAL LIGATION Bilateral    VAGINAL HYSTERECTOMY  03/2006   Patient Active Problem List   Diagnosis Date Noted   Sinus tachycardia 07/07/2023   Hypokalemia 07/29/2022   Weakness of both lower extremities 07/29/2022   Abscess of left groin 11/15/2021   Abnormal LFTs 11/15/2021   Acute respiratory disease due to COVID-19 virus 11/21/2020   Weakness    Hypoalbuminemia due to protein-calorie malnutrition [facility])    Neurogenic bowel    Neurogenic bladder    Labile blood pressure    Neuropathic pain    Abscess of female pelvis    SVT (supraventricular tachycardia) (HCC)    Radial styloid tenosynovitis 03/12/2018   Wheelchair dependence 02/27/2018   Localized osteoporosis with current pathological fracture with routine healing 01/19/2017   Wrist fracture 01/16/2017   Sprain of ankle 03/23/2016   Closed fracture of lateral malleolus 03/16/2016  Health care maintenance 01/24/2016   Blood pressure elevated without history of HTN 10/25/2015   Essential hypertension 10/25/2015   Multiple sclerosis (HCC) 10/02/2015   Chronic left shoulder pain 07/19/2015   Multiple sclerosis exacerbation (HCC) 07/14/2015   MS (multiple sclerosis) (HCC) 11/26/2014   Increased body mass index 11/26/2014   HPV test positive 11/26/2014   Status post laparoscopic supracervical hysterectomy 11/26/2014   Galactorrhea 11/26/2014   Back ache 05/21/2014   Adiposity 05/21/2014   Disordered sleep 05/21/2014   Muscle spasticity 05/21/2014   Spasticity 05/21/2014   Calculus of kidney 12/09/2013   Renal colic  12/09/2013   Hypercholesteremia 08/19/2013   Hereditary and idiopathic neuropathy 08/19/2013   Hypercholesterolemia without hypertriglyceridemia 08/19/2013   Bladder infection, chronic 07/25/2012   Disorder of bladder function 07/25/2012   Incomplete bladder emptying 07/25/2012   Microscopic hematuria 07/25/2012   Right upper quadrant pain 07/25/2012   ONSET DATE: 5/24 [name] and broke R leg which caused beginning of ADL decline; diagnosed with MS in 1995)  REFERRING DIAG: MS  THERAPY DIAG:  Muscle weakness (generalized)  Multiple sclerosis exacerbation (HCC)  Rationale for Evaluation and Treatment: Rehabilitation  SUBJECTIVE:  SUBJECTIVE STATEMENT: Pt reports doing well this morning. Pt accompanied by: self  PERTINENT HISTORY: Pt reports increased difficulty with basic self care tasks since breaking her R leg last May.  Since that time, mobility and ADLs have declined, with pt requiring assist from private caregivers and family to manage bathing, toileting, dressing, and functional transfers.  PRECAUTIONS: Fall  WEIGHT BEARING RESTRICTIONS: No  PAIN:  Are you having pain? No; occasional pain in R lower back, but not today  FALLS: Has patient fallen in last 6 months? No Last fall was May 17 of 2024  LIVING ENVIRONMENT: Lives with: lives with their family (including mother who has dementia and sister [name])  Lives in: 2 level home but pt resides on main level Stairs: ramp  Has following equipment at home: Wheelchair (manual), Shower bench, and hand held shower shower, 1 grab bar in the shower,  3in1 commode, sit to stand lift (electric), FWW, hospital bed  PLOF: modified indep with ADL/IADLs prior to May of 9528   PATIENT GOALS: Increase independence with basic self care tasks  OBJECTIVE:  Note: Objective measures were completed at Evaluation unless otherwise noted.  HAND DOMINANCE: Right  ADLs:  Overall ADLs: assist provided from paid caregiver and  sister Transfers/ambulation related to ADLs: sit to stand lift for all transfers, set up for sliding board in/out of bed  Eating: indep  Grooming: modified indep (wc level in mom's bathroom)  UB Dressing: set up (can't access her bedroom closet)  LB Dressing: able to sit on side of bed to don all LB clothing; assist to hike pants/underwear Toileting: assist with clothing management d/t standing within electric lift Bathing: bed bath with sister helping to wash backside Tub Shower transfers: N/A; pt has walk in shower in mom's bathroom (wc does fit in this bathroom but pt reports inability to transfer from wc and requires arm rests on a bench for a successful transfer attempt from any DME).  Tub bench is in pt's bathroom, but lift does not fit through bathroom doorway and pt reports inability to transfer up from tub bench (currently unable to manage either transfer)   Equipment:  see above  IADLs: Shopping: [employer] for shopping Light housekeeping: modified indep from wc level Meal Prep: modified indep from wc level Community mobility: relies on community transportation or  family members Medication management: indep Financial management: indep Handwriting:  NT; pt denies any FMC challenges  MOBILITY STATUS: Hx of falls  POSTURE COMMENTS:  Rounded shoulders, forward head, anterior pelvic tilt, and weight shift left   ACTIVITY TOLERANCE: Activity tolerance: Per PT; currently standing 30-60 sec within Light Gait harness/lift; currently non-ambulatory  UPPER EXTREMITY ROM:  BUEs WFL  UPPER EXTREMITY MMT:     MMT Right eval Left eval  Shoulder flexion 4+ 4  Shoulder abduction 4+ 4  Shoulder adduction    Shoulder extension    Shoulder internal rotation 4+ 4+  Shoulder external rotation 4 4  Middle trapezius    Lower trapezius    Elbow flexion 4+ 4+  Elbow extension 4+ 4+  Wrist flexion 4+ 4+  Wrist extension 4+ 4+  Wrist ulnar deviation    Wrist radial deviation    Wrist  pronation    Wrist supination    (Blank rows = not tested)  HAND FUNCTION/COORDINATION:  R/L WNL; pt denies any coordination deficits/ able to manipulate pills/clothing fasteners/etc without difficulty  SENSATION: WFL  EDEMA: No visible edema in BUEs  MUSCLE TONE: BUEs WNL  COGNITION: Overall cognitive status: Within functional limits for tasks assessed  VISION: wears glasses all the time, no reports of diplopia  PERCEPTION: Not tested  PRAXIS: WFL  OBSERVATIONS:  Pt pleasant, cooperative, and motivated to work towards improving indep with ADLs.                                                                                                     TREATMENT DATE: 09/07/23 Self Care: -Practiced clothing management on edge of mat table in prep for toileting tasks and brief changes.  Pt doffed spandex pants using lateral leans, reacher, and leg lifter.  Min A to lift legs part way to cross at knees. -Pt doffed shoes with reacher, donned with min A d/t fatigue -Pt donned pants again, requiring short rest breaks between each step noted above -1 instance of min A given to return to midline after a posterior/lateral lean  PATIENT EDUCATION: Education details: Acupuncturist strategies Person educated: Patient Education method: Explanation, demo Education comprehension: verbalized understanding, demonstrating understanding  HOME EXERCISE PROGRAM: IR A/AAROM to promote shoulder flexibility for peri care/bathing  GOALS: Goals reviewed with patient? Yes  SHORT TERM GOALS: Target date: 09/27/23  Pt will perform bed bed bath with set up.  Baseline: Eval: Min-mod A for posterior washing Goal status: INITIAL  2.  Pt will utilize sliding board for wc<>drop arm commode transfer with SBA. Baseline: Eval: Currently using electric lift for transfer to [facility] Goal status: INITIAL  3.  Pt will be indep to perform HEP for maintaining BUE strength for ADLs and functional  transfers. Baseline: Eval: HEP not yet initiated Goal status: INITIAL  LONG TERM GOALS: Target date: 11/08/23  Pt will perform sink bath with min A. Baseline: Eval: Currently bed bathing d/t inability to stand at sink without electric lift (lift does not fit into pt's bathroom).   Goal status: INITIAL  2.  Pt will perform  wc<>tub bench transfer with min A. Baseline: Eval: Unable Goal status: INITIAL  3.  Pt will perform squat pivot transfer wc<>BSC with SBA. Baseline: Eval: Eval: Currently using electric lift for transfer to [facility] Goal status: INITIAL  4. Pt will perform scoot transfer wc<>toilet using grab bars to allow toileting in community setting.  Baseline: Eval: Pt limits community outings d/t requiring use of lift for [facility] transfers.   Goal status: INITIAL  5.  Pt will tolerate standing x1 min to manage clothing in prep for toileting. Baseline: Eval: Currently standing in electric lift only, or within parallel bars without lift Goal status: INITIAL   ASSESSMENT: CLINICAL IMPRESSION: Pt able to don/doff spandex pants and shoes/braces from edge of mat table to simulate necessary clothing changes if her brief became soiled.  Pt required min A for lifting legs to cross at knees for easier reach to feet, 1 instance of min A for return to midline after a posterior and lateral lean, and set up for reacher and leg lifter.  Rest breaks needed between each step of clothing management.  Pt will continue to benefit from skilled OT to work towards above noted goals in OT poc, working to maximize indep with daily tasks while reducing burden of care on caregivers.   PERFORMANCE DEFICITS: in functional skills including ADLs, IADLs, strength, pain, flexibility, Gross motor control, mobility, balance, body mechanics, endurance, and decreased knowledge of use of DME, and psychosocial skills including coping strategies, environmental adaptation, habits, and routines and behaviors.   IMPAIRMENTS: are  limiting patient from ADLs, IADLs, and social participation.   CO-MORBIDITIES: has co-morbidities such as neuropathy, back pain, obesity, MS, HTN  that affects occupational performance. Patient will benefit from skilled OT to address above impairments and improve overall function.  MODIFICATION OR ASSISTANCE TO COMPLETE EVALUATION: No modification of tasks or assist necessary to complete an evaluation.  OT OCCUPATIONAL PROFILE AND HISTORY: Detailed assessment: Review of records and additional review of physical, cognitive, psychosocial history related to current functional performance.  CLINICAL DECISION MAKING: Moderate - several treatment options, min-mod task modification necessary  REHAB POTENTIAL: Good  EVALUATION COMPLEXITY: Moderate    PLAN:  OT FREQUENCY: 2x/week  OT DURATION: 12 weeks  PLANNED INTERVENTIONS: 97168 OT Re-evaluation, 97535 self care/ADL training, 16109 therapeutic exercise, 97530 therapeutic activity, 97112 neuromuscular re-education, 97140 manual therapy, 97116 gait training, 60454 moist heat, 97010 cryotherapy, balance training, functional mobility training, psychosocial skills training, energy conservation, coping strategies training, patient/family education, and DME and/or AE instructions  RECOMMENDED OTHER SERVICES: None at this time (Pt currently receiving PT services in this clinic)  CONSULTED AND AGREED WITH PLAN OF CARE: Patient  PLAN FOR NEXT SESSION: see above  Marcus Sewer, MS, OTR/L   Casandra Claw, OT 09/09/2023, 3:13 PM

## 2023-09-10 NOTE — Therapy (Signed)
 OUTPATIENT PHYSICAL THERAPY NEURO TREATMENT     Patient Name: Lisa Williams MRN: [id number] DOB:1960-08-19, 63 y.o., female Today's Date: 09/11/2023   PCP: Overton Blotter  REFERRING PROVIDER: Overton Blotter  END OF SESSION:  PT End of Session - 09/11/23 1057     Visit Number 17    Number of Visits 24    Date for PT Re-Evaluation 10/04/23    PT Start Time 1100    PT Stop Time 1140    PT Time Calculation (min) 40 min    Equipment Utilized During Treatment Gait belt    Activity Tolerance Patient tolerated treatment well;No increased pain    Behavior During Therapy [facility] for tasks assessed/performed                           Past Medical History:  Diagnosis Date   Abdominal pain, right upper quadrant    Back pain    Calculus of kidney 12/09/2013   Chronic back pain    unspecified   Chronic left shoulder pain 07/19/2015   Complication of anesthesia    Functional disorder of bladder    other   Galactorrhea 11/26/2014   Chronic    Hereditary and idiopathic neuropathy 08/19/2013   History of kidney stones    HPV test positive    Hypercholesteremia 08/19/2013   Hypertension    Incomplete bladder emptying    Microscopic hematuria    MS (multiple sclerosis) (HCC)    Muscle spasticity 05/21/2014   Nonspecific findings on examination of urine    other   Osteopenia    PONV (postoperative nausea and vomiting)    Status post laparoscopic supracervical hysterectomy 11/26/2014   Tobacco user 11/26/2014   Wrist fracture    Past Surgical History:  Procedure Laterality Date   bilateral tubal ligation  1996   BREAST CYST EXCISION Left 2002   CYST EXCISION Left 05/10/2022   Procedure: CYST REMOVAL;  Surgeon: Eldred Grego, MD;  Location: [facility] ORS;  Service: General;  Laterality: Left;   FRACTURE SURGERY     KNEE SURGERY     right   LAPAROSCOPIC SUPRACERVICAL HYSTERECTOMY  08/05/2013   ORIF WRIST FRACTURE Left 01/17/2017   Procedure:  OPEN REDUCTION INTERNAL FIXATION (ORIF) WRIST FRACTURE;  Surgeon: Jerlyn Moons, MD;  Location: [facility] ORS;  Service: Orthopedics;  Laterality: Left;   RADIOLOGY WITH ANESTHESIA N/A 03/18/2020   Procedure: MRI WITH ANESTHESIA CERVICAL SPINE AND BRAIN  WITH AND WITHOUT CONTRAST;  Surgeon: Radiologist, Medication, MD;  Location: [facility] OR;  Service: Radiology;  Laterality: N/A;   TUBAL LIGATION Bilateral    VAGINAL HYSTERECTOMY  03/2006   Patient Active Problem List   Diagnosis Date Noted   Sinus tachycardia 07/07/2023   Hypokalemia 07/29/2022   Weakness of both lower extremities 07/29/2022   Abscess of left groin 11/15/2021   Abnormal LFTs 11/15/2021   Acute respiratory disease due to COVID-19 virus 11/21/2020   Weakness    Hypoalbuminemia due to protein-calorie malnutrition [facility])    Neurogenic bowel    Neurogenic bladder    Labile blood pressure    Neuropathic pain    Abscess of female pelvis    SVT (supraventricular tachycardia) (HCC)    Radial styloid tenosynovitis 03/12/2018   Wheelchair dependence 02/27/2018   Localized osteoporosis with current pathological fracture with routine healing 01/19/2017   Wrist fracture 01/16/2017   Sprain of ankle 03/23/2016   Closed fracture of lateral malleolus 03/16/2016  Health care maintenance 01/24/2016   Blood pressure elevated without history of HTN 10/25/2015   Essential hypertension 10/25/2015   Multiple sclerosis (HCC) 10/02/2015   Chronic left shoulder pain 07/19/2015   Multiple sclerosis exacerbation (HCC) 07/14/2015   MS (multiple sclerosis) (HCC) 11/26/2014   Increased body mass index 11/26/2014   HPV test positive 11/26/2014   Status post laparoscopic supracervical hysterectomy 11/26/2014   Galactorrhea 11/26/2014   Back ache 05/21/2014   Adiposity 05/21/2014   Disordered sleep 05/21/2014   Muscle spasticity 05/21/2014   Spasticity 05/21/2014   Calculus of kidney 12/09/2013   Renal colic 12/09/2013   Hypercholesteremia  08/19/2013   Hereditary and idiopathic neuropathy 08/19/2013   Hypercholesterolemia without hypertriglyceridemia 08/19/2013   Bladder infection, chronic 07/25/2012   Disorder of bladder function 07/25/2012   Incomplete bladder emptying 07/25/2012   Microscopic hematuria 07/25/2012   Right upper quadrant pain 07/25/2012    ONSET DATE: 1995  REFERRING DIAG: MS  THERAPY DIAG:  Muscle weakness (generalized)  Multiple sclerosis exacerbation (HCC)  Unsteadiness on feet  Difficulty in walking, not elsewhere classified  Rationale for Evaluation and Treatment: Rehabilitation  SUBJECTIVE:                                                                                                                                                                                             SUBJECTIVE STATEMENT: Patient reports her legs are tingling due to doing a lot yesterday.  Pt accompanied by: self  PERTINENT HISTORY:  Patient is returning to PT s/p hospitalization.  s/p ORIF of R tibia shaft fracture 09/23/2022. Patient has weakness in BLE with RLE>LLE. She drives with hand controls. Patient has been diagnosed with MS in 1995. PMH includes: back pain, CBP, chronic L shoulder pain, galactorrhea, neuropathy, HPV, hypercholestermeia, HTN, MS, osteopenia, PONV, wrist fracture. Additional order for other closed fracture of proximal end of R tibia with routine healing. Still has to use [employer].   PAIN:  Are you having pain?  Occasional pain in RLE; primarily in knee  PRECAUTIONS: Fall  RED FLAGS: None   WEIGHT BEARING RESTRICTIONS: No  FALLS: Has patient fallen in last 6 months? No  LIVING ENVIRONMENT: Lives with: lives with their family Lives in: House/apartment Stairs:  ramp Has following equipment at home: Otho Blitz - 2 wheeled, Wheelchair (manual), shower chair, and Grab bars  PLOF: Independent with household mobility with device  PATIENT GOALS: to get her independence back. To be able to  get into car, toilet, and get dressed independently  OBJECTIVE:  Note: Objective measures were completed at Evaluation unless otherwise noted.  DIAGNOSTIC FINDINGS: MRI of  the brain 03/18/2020 showed multiple T2/FLAIR hyperintense foci in the periventricular, juxtacortical and deep white matter.  There were no infratentorial lesions noted.  None of the foci enhanced.   Due to severe claustrophobia, the study was done with conscious sedation in the hospital   COGNITION: Overall cognitive status: Within functional limits for tasks assessed   SENSATION: Lack of sensation in feet, loss in bilateral lateral aspect of knee  COORDINATION: Does not have the strength for functional LE heel slide test     MUSCLE TONE: BLE mild tone    POSTURE: rounded shoulders, forward head, anterior pelvic tilt, and weight shift left    LOWER EXTREMITY MMT:    MMT Right Eval Left Eval  Hip flexion 0.9 1.5  Hip extension    Hip abduction 1.8 2.6  Hip adduction 3.1 1.9  Hip internal rotation    Hip external rotation    Knee flexion 0.9 1.5  Knee extension 0.8 1.2  Ankle dorsiflexion    Ankle plantarflexion    Ankle inversion    Ankle eversion    (Blank rows = not tested)  BED MOBILITY:  Assess in future session due to limited time  TRANSFERS: Assistive device utilized:  Bariatric RW with sit to stands, slide board to table   Sit to stand:  unable to reach full stand Stand to sit:  unable to reach full stand  Chair to chair:  slide board with CGA     GAIT: Unable to ambulate at this time.   FUNCTIONAL TESTS:  Sit to stand: tricep press with BUE; unable to bring arm to walker.    Function In Sitting Test (FIST)  (1/2 femur on surface; hips/knees flexed to 90deg)   - indicate bed or mat table / step stool if used  SCORING KEY: 4 = Independent (completes task independently & successfully) 3 = Verbal Cues/Increased Time (completes task independently & successfully and only needs  more time/cues) 2 = Upper Extremity Support (must use UE for support or assistance to complete successfully) 1 = Needs Assistance (unable to complete w/o physical assist; DOCUMENT LEVEL: min, mod, max) 0 = Dependent (requires complete physical assist; unable to complete successfully even w/ physical assist)  Randomly Administer Once Throughout Exam  4 - Anterior Nudge (superior sternum)  4 - Posterior Nudge (between scapular spines)  4 - Lateral Nudge (to dominant side at acromion)     4 - Static sitting (30 seconds)  4 - Sitting, shake 'no' (left and right)  4 - Sitting, eyes closed (30 seconds)   0 - Sitting, lift foot (dominant side, lift foot 1 inch twice)    2 - Pick up object from behind (object at midline, hands breadth posterior)  3 - Forward reach (use dominant arm, must complete full motion) 2 - Lateral reach (use dominant arm, clear opposite ischial tuberosity) 2 - Pick up object from floor (from between feet)   2 - Posterior scooting (move backwards 2 inches)  2 - Anterior scooting (move forward 2 inches)  2 - Lateral scooting (move to dominant side 2 inches)    TOTAL = 39/56  Notes/comments: slide board tranfer to/from table   MCD > 5 points MCID for IP REHAB > 6 points  Function In Sitting Test (FIST) 08/14/23 (1/2 femur on surface; hips/knees flexed to 90deg)   - indicate bed or mat table / step stool if used  SCORING KEY: 4 = Independent (completes task independently & successfully) 3 = Verbal Cues/Increased Time (  completes task independently & successfully and only needs more time/cues) 2 = Upper Extremity Support (must use UE for support or assistance to complete successfully) 1 = Needs Assistance (unable to complete w/o physical assist; DOCUMENT LEVEL: min, mod, max) 0 = Dependent (requires complete physical assist; unable to complete successfully even w/ physical assist)  Randomly Administer Once Throughout Exam  4 - Anterior Nudge (superior sternum)  4  - Posterior Nudge (between scapular spines)  4 - Lateral Nudge (to dominant side at acromion)     4 - Static sitting (30 seconds)  4 - Sitting, shake 'no' (left and right)  4 - Sitting, eyes closed (30 seconds)   0 - Sitting, lift foot (dominant side, lift foot 1 inch twice)    4 - Pick up object from behind (object at midline, hands breadth posterior)  4 - Forward reach (use dominant arm, must complete full motion) 4 - Lateral reach (use dominant arm, clear opposite ischial tuberosity) 2 - Pick up object from floor (from between feet)   3 - Posterior scooting (move backwards 2 inches)  3 - Anterior scooting (move forward 2 inches)  3 - Lateral scooting (move to dominant side 2 inches)    TOTAL = 47/56    MCD > 5 points MCID for IP REHAB > 6 points                                                                                                                              TREATMENT DATE: 09/11/23  TherAct:  All activities performed with gait belt and CGA unless otherwise noted.   In // bars: Sit to stand; ambulate length of // bars with support and cues for weight shift; close wheelchair follow. Significant flexion of RLE with weightbearing present this session. Partial sit to stand with focus on LE only 5x; ; 2 sets  Therex 9lb DB: -bicep curl 10x -hammer curl with single arm 10x -chest press 10x  -woodchop 10x; each side -overhead press 10x  Abduction isometric press 10x Adduction isometric press 10x Contract relax into PT leg 10x    PATIENT EDUCATION: Education details: goals, POC, HEP  Person educated: Patient Education method: Explanation, Demonstration, Tactile cues, Verbal cues, and Handouts Education comprehension: verbalized understanding, returned demonstration, verbal cues required, tactile cues required, and needs further education  HOME EXERCISE PROGRAM: Stand 3x/day in stander   GOALS: Goals reviewed with patient? Yes  SHORT TERM GOALS: Target  date: 08/08/2023    Patient will be independent in home exercise program to improve strength/mobility for better functional independence with ADLs.  Baseline: 4/8: compliance Goal status: MET    LONG TERM GOALS: Target date: 10/04/2023    Patient will tolerate five consecutive stands with UE support from wheelchair to standing to improve functional mobility.  Baseline: unable to perform 4/8:unable to perform full stand Goal status: Ongoing   2.  Patient will ambulate 10 ft with bRW with wheelchair follow.  Baseline:  unable to ambulate 4/8: unable to ambulate Goal status: partially met  3.  Patient will improve FIST score >6 points to demonstrate improved stability and ability to perform ADLs.  Baseline:  3/6: 39/56 4/8: 47/ 56  Goal status: MET  4.  Patient will perform toileting with mod I at home for improved independence.  Baseline: 3/6: requires assistance 4/8: requires dependence on machine Goal status: Partially Met    ASSESSMENT:  CLINICAL IMPRESSION: Patient has increased collapse of affected RLE in weightbearing this session requiring close CGA and wheelchair follow in standing. She is fatigued by end of session and reports tingling in her legs. Cue for forward momentum rather than vertical push during sit to stand transitions required throughout session.  Patient will benefit from skilled physical therapy to improve mobility, stability, and independence.   OBJECTIVE IMPAIRMENTS: Abnormal gait, cardiopulmonary status limiting activity, decreased activity tolerance, decreased balance, decreased coordination, decreased endurance, decreased mobility, difficulty walking, decreased ROM, decreased strength, hypomobility, increased fascial restrictions, impaired perceived functional ability, impaired flexibility, impaired sensation, improper body mechanics, and postural dysfunction.   ACTIVITY LIMITATIONS: carrying, lifting, bending, sitting, standing, squatting, sleeping,  stairs, transfers, bed mobility, continence, bathing, toileting, dressing, self feeding, reach over head, hygiene/grooming, locomotion level, and caring for others  PARTICIPATION LIMITATIONS: meal prep, cleaning, laundry, interpersonal relationship, driving, shopping, community activity, and church  PERSONAL FACTORS: Age, Past/current experiences, Time since onset of injury/illness/exacerbation, Transportation, and 3+ comorbidities: ack pain, CBP, chronic L shoulder pain, galactorrhea, neuropathy, HPV, hypercholestermeia, HTN, MS, osteopenia, PONV, wrist fracture  are also affecting patient's functional outcome.   REHAB POTENTIAL: Good  CLINICAL DECISION MAKING: Evolving/moderate complexity  EVALUATION COMPLEXITY: Moderate  PLAN:  PT FREQUENCY: 2x/week  PT DURATION: 12 weeks  PLANNED INTERVENTIONS: 97164- PT Re-evaluation, 97110-Therapeutic exercises, 97530- Therapeutic activity, 97112- Neuromuscular re-education, 97535- Self Care, 16109- Manual therapy, [phone]- Gait training, [phone]- Orthotic Fit/training, [phone]- Canalith repositioning, [id number]- Aquatic Therapy, [phone]- Electrical stimulation (unattended), [phone]- Electrical stimulation (manual), [id number]- Ultrasound, [id number]- Traction (mechanical), Patient/Family education, Balance training, Stair training, Taping, Dry Needling, Joint mobilization, Joint manipulation, Spinal manipulation, Spinal mobilization, Scar mobilization, Compression bandaging, Vestibular training, Visual/preceptual remediation/compensation, Cognitive remediation, DME instructions, Cryotherapy, Moist heat, and Biofeedback  PLAN FOR NEXT SESSION: sit to stands; try supported walking   Hephzibah Strehle  Brain Cahill, PT, DPT Physical Therapist - [facility]  Outpatient Physical Therapy- Main Campus [phone]    09/11/2023, 1:07 PM

## 2023-09-11 ENCOUNTER — Ambulatory Visit: Payer: PPO

## 2023-09-11 ENCOUNTER — Ambulatory Visit

## 2023-09-11 DIAGNOSIS — G35 Multiple sclerosis: Secondary | ICD-10-CM

## 2023-09-11 DIAGNOSIS — M6281 Muscle weakness (generalized): Secondary | ICD-10-CM

## 2023-09-11 DIAGNOSIS — R262 Difficulty in walking, not elsewhere classified: Secondary | ICD-10-CM

## 2023-09-11 DIAGNOSIS — R2681 Unsteadiness on feet: Secondary | ICD-10-CM

## 2023-09-11 NOTE — Therapy (Signed)
 OUTPATIENT OCCUPATIONAL THERAPY NEURO TREATMENT NOTE   Patient Name: Lisa Williams MRN: [id number] DOB:1961/04/26, 63 y.o., female Today's Date: 09/11/2023  PCP: Dr. Overton Williams   Neurologist Dr. Octavio Williams at [facility] REFERRING PROVIDER: Dr. Overton Williams    END OF SESSION:  OT End of Session - 09/11/23 1246     Visit Number 7    Number of Visits 24    Date for OT Re-Evaluation 11/08/23    Progress Note Due on Visit 10    OT Start Time 1145    OT Stop Time 1225    OT Time Calculation (min) 40 min    Equipment Utilized During Treatment manual wc    Activity Tolerance Patient tolerated treatment well    Behavior During Therapy [facility] for tasks assessed/performed            Past Medical History:  Diagnosis Date   Abdominal pain, right upper quadrant    Back pain    Calculus of kidney 12/09/2013   Chronic back pain    unspecified   Chronic left shoulder pain 07/19/2015   Complication of anesthesia    Functional disorder of bladder    other   Galactorrhea 11/26/2014   Chronic    Hereditary and idiopathic neuropathy 08/19/2013   History of kidney stones    HPV test positive    Hypercholesteremia 08/19/2013   Hypertension    Incomplete bladder emptying    Microscopic hematuria    MS (multiple sclerosis) (HCC)    Muscle spasticity 05/21/2014   Nonspecific findings on examination of urine    other   Osteopenia    PONV (postoperative nausea and vomiting)    Status post laparoscopic supracervical hysterectomy 11/26/2014   Tobacco user 11/26/2014   Wrist fracture    Past Surgical History:  Procedure Laterality Date   bilateral tubal ligation  1996   BREAST CYST EXCISION Left 2002   CYST EXCISION Left 05/10/2022   Procedure: CYST REMOVAL;  Surgeon: Lisa Grego, MD;  Location: [facility] ORS;  Service: General;  Laterality: Left;   FRACTURE SURGERY     KNEE SURGERY     right   LAPAROSCOPIC SUPRACERVICAL HYSTERECTOMY  08/05/2013   ORIF WRIST FRACTURE  Left 01/17/2017   Procedure: OPEN REDUCTION INTERNAL FIXATION (ORIF) WRIST FRACTURE;  Surgeon: Lisa Moons, MD;  Location: [facility] ORS;  Service: Orthopedics;  Laterality: Left;   RADIOLOGY WITH ANESTHESIA N/A 03/18/2020   Procedure: MRI WITH ANESTHESIA CERVICAL SPINE AND BRAIN  WITH AND WITHOUT CONTRAST;  Surgeon: Radiologist, Medication, MD;  Location: [facility] OR;  Service: Radiology;  Laterality: N/A;   TUBAL LIGATION Bilateral    VAGINAL HYSTERECTOMY  03/2006   Patient Active Problem List   Diagnosis Date Noted   Sinus tachycardia 07/07/2023   Hypokalemia 07/29/2022   Weakness of both lower extremities 07/29/2022   Abscess of left groin 11/15/2021   Abnormal LFTs 11/15/2021   Acute respiratory disease due to COVID-19 virus 11/21/2020   Weakness    Hypoalbuminemia due to protein-calorie malnutrition [facility])    Neurogenic bowel    Neurogenic bladder    Labile blood pressure    Neuropathic pain    Abscess of female pelvis    SVT (supraventricular tachycardia) (HCC)    Radial styloid tenosynovitis 03/12/2018   Wheelchair dependence 02/27/2018   Localized osteoporosis with current pathological fracture with routine healing 01/19/2017   Wrist fracture 01/16/2017   Sprain of ankle 03/23/2016   Closed fracture of lateral malleolus 03/16/2016  Health care maintenance 01/24/2016   Blood pressure elevated without history of HTN 10/25/2015   Essential hypertension 10/25/2015   Multiple sclerosis (HCC) 10/02/2015   Chronic left shoulder pain 07/19/2015   Multiple sclerosis exacerbation (HCC) 07/14/2015   MS (multiple sclerosis) (HCC) 11/26/2014   Increased body mass index 11/26/2014   HPV test positive 11/26/2014   Status post laparoscopic supracervical hysterectomy 11/26/2014   Galactorrhea 11/26/2014   Back ache 05/21/2014   Adiposity 05/21/2014   Disordered sleep 05/21/2014   Muscle spasticity 05/21/2014   Spasticity 05/21/2014   Calculus of kidney 12/09/2013   Renal colic  12/09/2013   Hypercholesteremia 08/19/2013   Hereditary and idiopathic neuropathy 08/19/2013   Hypercholesterolemia without hypertriglyceridemia 08/19/2013   Bladder infection, chronic 07/25/2012   Disorder of bladder function 07/25/2012   Incomplete bladder emptying 07/25/2012   Microscopic hematuria 07/25/2012   Right upper quadrant pain 07/25/2012   ONSET DATE: 5/24 Lisa Williams and broke R leg which caused beginning of ADL decline; diagnosed with MS in 1995)  REFERRING DIAG: MS  THERAPY DIAG:  Muscle weakness (generalized)  Multiple sclerosis exacerbation (HCC)  Rationale for Evaluation and Treatment: Rehabilitation  SUBJECTIVE:  SUBJECTIVE STATEMENT: Pt reports that she doesn't feel like her legs are up for trying a squat pivot toilet transfer today.   Pt accompanied by: self  PERTINENT HISTORY: Pt reports increased difficulty with basic self care tasks since breaking her R leg last May.  Since that time, mobility and ADLs have declined, with pt requiring assist from private caregivers and family to manage bathing, toileting, dressing, and functional transfers.  PRECAUTIONS: Fall  WEIGHT BEARING RESTRICTIONS: No  PAIN: 09/11/23: No pain reported today Are you having pain? No; occasional pain in R lower back, but not today  FALLS: Has patient fallen in last 6 months? No Last fall was May 17 of 2024  LIVING ENVIRONMENT: Lives with: lives with their family (including mother who has dementia and sister Lisa Williams)  Lives in: 2 level home but pt resides on main level Stairs: ramp  Has following equipment at home: Wheelchair (manual), Shower bench, and hand held shower shower, 1 grab bar in the shower,  3in1 commode, sit to stand lift (electric), FWW, hospital bed  PLOF: modified indep with ADL/IADLs prior to May of 8657   PATIENT GOALS: Increase independence with basic self care tasks  OBJECTIVE:  Note: Objective measures were completed at Evaluation unless otherwise noted.  HAND  DOMINANCE: Right  ADLs:  Overall ADLs: assist provided from paid caregiver and sister Transfers/ambulation related to ADLs: sit to stand lift for all transfers, set up for sliding board in/out of bed  Eating: indep  Grooming: modified indep (wc level in mom's bathroom)  UB Dressing: set up (can't access her bedroom closet)  LB Dressing: able to sit on side of bed to don all LB clothing; assist to hike pants/underwear Toileting: assist with clothing management d/t standing within electric lift Bathing: bed bath with sister helping to wash backside Tub Shower transfers: N/A; pt has walk in shower in mom's bathroom (wc does fit in this bathroom but pt reports inability to transfer from wc and requires arm rests on a bench for a successful transfer attempt from any DME).  Tub bench is in pt's bathroom, but lift does not fit through bathroom doorway and pt reports inability to transfer up from tub bench (currently unable to manage either transfer)   Equipment:  see above  IADLs: Shopping: [employer] for shopping Light housekeeping:  modified indep from wc level Meal Prep: modified indep from wc level Community mobility: relies on community transportation or family members Medication management: indep Landscape architect: indep Handwriting:  NT; pt denies any FMC challenges  MOBILITY STATUS: Hx of falls  POSTURE COMMENTS:  Rounded shoulders, forward head, anterior pelvic tilt, and weight shift left   ACTIVITY TOLERANCE: Activity tolerance: Per PT; currently standing 30-60 sec within Light Gait harness/lift; currently non-ambulatory  UPPER EXTREMITY ROM:  BUEs WFL  UPPER EXTREMITY MMT:     MMT Right eval Left eval  Shoulder flexion 4+ 4  Shoulder abduction 4+ 4  Shoulder adduction    Shoulder extension    Shoulder internal rotation 4+ 4+  Shoulder external rotation 4 4  Middle trapezius    Lower trapezius    Elbow flexion 4+ 4+  Elbow extension 4+ 4+  Wrist flexion 4+ 4+   Wrist extension 4+ 4+  Wrist ulnar deviation    Wrist radial deviation    Wrist pronation    Wrist supination    (Blank rows = not tested)  HAND FUNCTION/COORDINATION:  R/L WNL; pt denies any coordination deficits/ able to manipulate pills/clothing fasteners/etc without difficulty  SENSATION: WFL  EDEMA: No visible edema in BUEs  MUSCLE TONE: BUEs WNL  COGNITION: Overall cognitive status: Within functional limits for tasks assessed  VISION: wears glasses all the time, no reports of diplopia  PERCEPTION: Not tested  PRAXIS: WFL  OBSERVATIONS:  Pt pleasant, cooperative, and motivated to work towards improving indep with ADLs.                                                                                                     TREATMENT DATE: 09/11/23 Self Care: -Practice trials with scoot transfer wc<>transfer tub bench over tub in OT bathroom. -Min vc for wc positioning slightly angled next to commode, and min A for foot placement to aid in scooting -Trialed placement of pillow and folded towel over wc wheel to reduce discomfort with pt scooting overtop wheel -Reviewed strategies for practicing with new bottom buddy at home (newly arrived in mail) while on drop arm commode; pt will likely need to drop L arm rest on commode to allow for lateral leaning   PATIENT EDUCATION: Education details: tub bench transfer techniques; toileting strategies Person educated: Patient Education method: Explanation, demo, vc Education comprehension: verbalized understanding, further training needed  HOME EXERCISE PROGRAM: IR A/AAROM to promote shoulder flexibility for peri care/bathing  GOALS: Goals reviewed with patient? Yes  SHORT TERM GOALS: Target date: 09/27/23  Pt will perform bed bath with set up.  Baseline: Eval: Min-mod A for posterior washing Goal status: INITIAL  2.  Pt will utilize sliding board for wc<>drop arm commode transfer with SBA. Baseline: Eval: Currently using  electric lift for transfer to [facility] Goal status: INITIAL  3.  Pt will be indep to perform HEP for maintaining BUE strength for ADLs and functional transfers. Baseline: Eval: HEP not yet initiated Goal status: INITIAL  LONG TERM GOALS: Target date: 11/08/23  Pt will perform sink bath with min A.  Baseline: Eval: Currently bed bathing d/t inability to stand at sink without electric lift (lift does not fit into pt's bathroom).   Goal status: INITIAL  2.  Pt will perform wc<>tub bench transfer with min A. Baseline: Eval: Unable Goal status: INITIAL  3.  Pt will perform squat pivot transfer wc<>BSC with SBA. Baseline: Eval: Eval: Currently using electric lift for transfer to [facility] Goal status: INITIAL  4. Pt will perform scoot transfer wc<>toilet using grab bars to allow toileting in community setting.  Baseline: Eval: Pt limits community outings d/t requiring use of lift for [facility] transfers.   Goal status: INITIAL  5.  Pt will tolerate standing x1 min to manage clothing in prep for toileting. Baseline: Eval: Currently standing in electric lift only, or within parallel bars without lift Goal status: INITIAL   ASSESSMENT: CLINICAL IMPRESSION: Pt able to perform 50% of wc<>tub bench transfer this date, limited only by discomfort of scooting overtop of wc wheel.  Towel and pillow over wheel not sufficient to guard from discomfort.  OT discussed trial of propping pt up higher using a second wc cushion to minimize scoot over wheel; pt receptive and will plan to trial in an upcoming session, but will also continue to work towards improving height of buttocks clearance from seated surfaces during squat and scoot pivot transfers.  Pt plans to try new bottom buddy this week after strategies were reviewed today to accommodate room for lateral leaning for use of bottom buddy on drop arm 3in1 at home.  Encouraged pt to practice strategies this week so that any challenges can be problem solved through during  next OT session.  Pt will continue to benefit from skilled OT to work towards above noted goals in OT poc, working to maximize indep with daily tasks while reducing burden of care on caregivers.   PERFORMANCE DEFICITS: in functional skills including ADLs, IADLs, strength, pain, flexibility, Gross motor control, mobility, balance, body mechanics, endurance, and decreased knowledge of use of DME, and psychosocial skills including coping strategies, environmental adaptation, habits, and routines and behaviors.   IMPAIRMENTS: are limiting patient from ADLs, IADLs, and social participation.   CO-MORBIDITIES: has co-morbidities such as neuropathy, back pain, obesity, MS, HTN  that affects occupational performance. Patient will benefit from skilled OT to address above impairments and improve overall function.  MODIFICATION OR ASSISTANCE TO COMPLETE EVALUATION: No modification of tasks or assist necessary to complete an evaluation.  OT OCCUPATIONAL PROFILE AND HISTORY: Detailed assessment: Review of records and additional review of physical, cognitive, psychosocial history related to current functional performance.  CLINICAL DECISION MAKING: Moderate - several treatment options, min-mod task modification necessary  REHAB POTENTIAL: Good  EVALUATION COMPLEXITY: Moderate    PLAN:  OT FREQUENCY: 2x/week  OT DURATION: 12 weeks  PLANNED INTERVENTIONS: 97168 OT Re-evaluation, 97535 self care/ADL training, 16109 therapeutic exercise, 97530 therapeutic activity, 97112 neuromuscular re-education, 97140 manual therapy, 97116 gait training, 60454 moist heat, 97010 cryotherapy, balance training, functional mobility training, psychosocial skills training, energy conservation, coping strategies training, patient/family education, and DME and/or AE instructions  RECOMMENDED OTHER SERVICES: None at this time (Pt currently receiving PT services in this clinic)  CONSULTED AND AGREED WITH PLAN OF CARE:  Patient  PLAN FOR NEXT SESSION: see above  Marcus Sewer, MS, OTR/L   Casandra Claw, OT 09/11/2023, 12:47 PM

## 2023-09-12 NOTE — Therapy (Signed)
 OUTPATIENT PHYSICAL THERAPY NEURO TREATMENT     Patient Name: Lisa Williams MRN: [id number] DOB:Mar 14, 1961, 63 y.o., female Today's Date: 09/13/2023   PCP: Overton Blotter  REFERRING PROVIDER: Overton Blotter  END OF SESSION:  PT End of Session - 09/13/23 1015     Visit Number 18    Number of Visits 24    Date for PT Re-Evaluation 10/04/23    PT Start Time 1015    PT Stop Time 1059    PT Time Calculation (min) 44 min    Equipment Utilized During Treatment Gait belt    Activity Tolerance Patient tolerated treatment well;No increased pain    Behavior During Therapy [facility] for tasks assessed/performed                            Past Medical History:  Diagnosis Date   Abdominal pain, right upper quadrant    Back pain    Calculus of kidney 12/09/2013   Chronic back pain    unspecified   Chronic left shoulder pain 07/19/2015   Complication of anesthesia    Functional disorder of bladder    other   Galactorrhea 11/26/2014   Chronic    Hereditary and idiopathic neuropathy 08/19/2013   History of kidney stones    HPV test positive    Hypercholesteremia 08/19/2013   Hypertension    Incomplete bladder emptying    Microscopic hematuria    MS (multiple sclerosis) (HCC)    Muscle spasticity 05/21/2014   Nonspecific findings on examination of urine    other   Osteopenia    PONV (postoperative nausea and vomiting)    Status post laparoscopic supracervical hysterectomy 11/26/2014   Tobacco user 11/26/2014   Wrist fracture    Past Surgical History:  Procedure Laterality Date   bilateral tubal ligation  1996   BREAST CYST EXCISION Left 2002   CYST EXCISION Left 05/10/2022   Procedure: CYST REMOVAL;  Surgeon: Eldred Grego, MD;  Location: [facility] ORS;  Service: General;  Laterality: Left;   FRACTURE SURGERY     KNEE SURGERY     right   LAPAROSCOPIC SUPRACERVICAL HYSTERECTOMY  08/05/2013   ORIF WRIST FRACTURE Left 01/17/2017   Procedure:  OPEN REDUCTION INTERNAL FIXATION (ORIF) WRIST FRACTURE;  Surgeon: Jerlyn Moons, MD;  Location: [facility] ORS;  Service: Orthopedics;  Laterality: Left;   RADIOLOGY WITH ANESTHESIA N/A 03/18/2020   Procedure: MRI WITH ANESTHESIA CERVICAL SPINE AND BRAIN  WITH AND WITHOUT CONTRAST;  Surgeon: Radiologist, Medication, MD;  Location: [facility] OR;  Service: Radiology;  Laterality: N/A;   TUBAL LIGATION Bilateral    VAGINAL HYSTERECTOMY  03/2006   Patient Active Problem List   Diagnosis Date Noted   Sinus tachycardia 07/07/2023   Hypokalemia 07/29/2022   Weakness of both lower extremities 07/29/2022   Abscess of left groin 11/15/2021   Abnormal LFTs 11/15/2021   Acute respiratory disease due to COVID-19 virus 11/21/2020   Weakness    Hypoalbuminemia due to protein-calorie malnutrition [facility])    Neurogenic bowel    Neurogenic bladder    Labile blood pressure    Neuropathic pain    Abscess of female pelvis    SVT (supraventricular tachycardia) (HCC)    Radial styloid tenosynovitis 03/12/2018   Wheelchair dependence 02/27/2018   Localized osteoporosis with current pathological fracture with routine healing 01/19/2017   Wrist fracture 01/16/2017   Sprain of ankle 03/23/2016   Closed fracture of lateral malleolus 03/16/2016  Health care maintenance 01/24/2016   Blood pressure elevated without history of HTN 10/25/2015   Essential hypertension 10/25/2015   Multiple sclerosis (HCC) 10/02/2015   Chronic left shoulder pain 07/19/2015   Multiple sclerosis exacerbation (HCC) 07/14/2015   MS (multiple sclerosis) (HCC) 11/26/2014   Increased body mass index 11/26/2014   HPV test positive 11/26/2014   Status post laparoscopic supracervical hysterectomy 11/26/2014   Galactorrhea 11/26/2014   Back ache 05/21/2014   Adiposity 05/21/2014   Disordered sleep 05/21/2014   Muscle spasticity 05/21/2014   Spasticity 05/21/2014   Calculus of kidney 12/09/2013   Renal colic 12/09/2013   Hypercholesteremia  08/19/2013   Hereditary and idiopathic neuropathy 08/19/2013   Hypercholesterolemia without hypertriglyceridemia 08/19/2013   Bladder infection, chronic 07/25/2012   Disorder of bladder function 07/25/2012   Incomplete bladder emptying 07/25/2012   Microscopic hematuria 07/25/2012   Right upper quadrant pain 07/25/2012    ONSET DATE: 1995  REFERRING DIAG: MS  THERAPY DIAG:  Muscle weakness (generalized)  Multiple sclerosis exacerbation (HCC)  Unsteadiness on feet  Difficulty in walking, not elsewhere classified  Rationale for Evaluation and Treatment: Rehabilitation  SUBJECTIVE:                                                                                                                                                                                             SUBJECTIVE STATEMENT: Patient reports feeling good today. Has lots of energy.  Pt accompanied by: self  PERTINENT HISTORY:  Patient is returning to PT s/p hospitalization.  s/p ORIF of R tibia shaft fracture 09/23/2022. Patient has weakness in BLE with RLE>LLE. She drives with hand controls. Patient has been diagnosed with MS in 1995. PMH includes: back pain, CBP, chronic L shoulder pain, galactorrhea, neuropathy, HPV, hypercholestermeia, HTN, MS, osteopenia, PONV, wrist fracture. Additional order for other closed fracture of proximal end of R tibia with routine healing. Still has to use [employer].   PAIN:  Are you having pain? Occasional pain in RLE; primarily in knee  PRECAUTIONS: Fall  RED FLAGS: None   WEIGHT BEARING RESTRICTIONS: No  FALLS: Has patient fallen in last 6 months? No  LIVING ENVIRONMENT: Lives with: lives with their family Lives in: House/apartment Stairs: ramp Has following equipment at home: Otho Blitz - 2 wheeled, Wheelchair (manual), shower chair, and Grab bars  PLOF: Independent with household mobility with device  PATIENT GOALS: to get her independence back. To be able to get into car,  toilet, and get dressed independently  OBJECTIVE:  Note: Objective measures were completed at Evaluation unless otherwise noted.  DIAGNOSTIC FINDINGS: MRI of the brain 03/18/2020 showed multiple  T2/FLAIR hyperintense foci in the periventricular, juxtacortical and deep white matter.  There were no infratentorial lesions noted.  None of the foci enhanced.   Due to severe claustrophobia, the study was done with conscious sedation in the hospital   COGNITION: Overall cognitive status: Within functional limits for tasks assessed   SENSATION: Lack of sensation in feet, loss in bilateral lateral aspect of knee  COORDINATION: Does not have the strength for functional LE heel slide test     MUSCLE TONE: BLE mild tone    POSTURE: rounded shoulders, forward head, anterior pelvic tilt, and weight shift left    LOWER EXTREMITY MMT:    MMT Right Eval Left Eval  Hip flexion 0.9 1.5  Hip extension    Hip abduction 1.8 2.6  Hip adduction 3.1 1.9  Hip internal rotation    Hip external rotation    Knee flexion 0.9 1.5  Knee extension 0.8 1.2  Ankle dorsiflexion    Ankle plantarflexion    Ankle inversion    Ankle eversion    (Blank rows = not tested)  BED MOBILITY:  Assess in future session due to limited time  TRANSFERS: Assistive device utilized: Bariatric RW with sit to stands, slide board to table  Sit to stand: unable to reach full stand Stand to sit: unable to reach full stand  Chair to chair: slide board with CGA     GAIT: Unable to ambulate at this time.   FUNCTIONAL TESTS:  Sit to stand: tricep press with BUE; unable to bring arm to walker.    Function In Sitting Test (FIST)  (1/2 femur on surface; hips/knees flexed to 90deg)   - indicate bed or mat table / step stool if used  SCORING KEY: 4 = Independent (completes task independently & successfully) 3 = Verbal Cues/Increased Time (completes task independently & successfully and only needs more time/cues) 2 =  Upper Extremity Support (must use UE for support or assistance to complete successfully) 1 = Needs Assistance (unable to complete w/o physical assist; DOCUMENT LEVEL: min, mod, max) 0 = Dependent (requires complete physical assist; unable to complete successfully even w/ physical assist)  Randomly Administer Once Throughout Exam  4 - Anterior Nudge (superior sternum)  4 - Posterior Nudge (between scapular spines)  4 - Lateral Nudge (to dominant side at acromion)     4 - Static sitting (30 seconds)  4 - Sitting, shake 'no' (left and right)  4 - Sitting, eyes closed (30 seconds)   0 - Sitting, lift foot (dominant side, lift foot 1 inch twice)    2 - Pick up object from behind (object at midline, hands breadth posterior)  3 - Forward reach (use dominant arm, must complete full motion) 2 - Lateral reach (use dominant arm, clear opposite ischial tuberosity) 2 - Pick up object from floor (from between feet)   2 - Posterior scooting (move backwards 2 inches)  2 - Anterior scooting (move forward 2 inches)  2 - Lateral scooting (move to dominant side 2 inches)    TOTAL = 39/56  Notes/comments: slide board tranfer to/from table   MCD > 5 points MCID for IP REHAB > 6 points  Function In Sitting Test (FIST) 08/14/23 (1/2 femur on surface; hips/knees flexed to 90deg)   - indicate bed or mat table / step stool if used  SCORING KEY: 4 = Independent (completes task independently & successfully) 3 = Verbal Cues/Increased Time (completes task independently & successfully and only needs more time/cues)  2 = Upper Extremity Support (must use UE for support or assistance to complete successfully) 1 = Needs Assistance (unable to complete w/o physical assist; DOCUMENT LEVEL: min, mod, max) 0 = Dependent (requires complete physical assist; unable to complete successfully even w/ physical assist)  Randomly Administer Once Throughout Exam  4 - Anterior Nudge (superior sternum)  4 - Posterior Nudge  (between scapular spines)  4 - Lateral Nudge (to dominant side at acromion)     4 - Static sitting (30 seconds)  4 - Sitting, shake 'no' (left and right)  4 - Sitting, eyes closed (30 seconds)   0 - Sitting, lift foot (dominant side, lift foot 1 inch twice)    4 - Pick up object from behind (object at midline, hands breadth posterior)  4 - Forward reach (use dominant arm, must complete full motion) 4 - Lateral reach (use dominant arm, clear opposite ischial tuberosity) 2 - Pick up object from floor (from between feet)   3 - Posterior scooting (move backwards 2 inches)  3 - Anterior scooting (move forward 2 inches)  3 - Lateral scooting (move to dominant side 2 inches)    TOTAL = 47/56    MCD > 5 points MCID for IP REHAB > 6 points                                                                                                                              TREATMENT DATE: 09/13/23  TherAct:  All activities performed with gait belt and CGA unless otherwise noted.  Sit to stand from wheelchair with pad in chair to BrW ; min A x 2 person assist. X3 attempts; one full success  Partial sit to stand with focus on LE only 5x; ; 2 sets  Therex Arm circles 10x each direction 2 sets Abduction arms 10x Arm flexion 10x;  [place] 10x each arm   Abduction isometric press 10x Adduction isometric press 10x Contract relax into PT leg 10x    PATIENT EDUCATION: Education details: goals, POC, HEP  Person educated: Patient Education method: Explanation, Demonstration, Tactile cues, Verbal cues, and Handouts Education comprehension: verbalized understanding, returned demonstration, verbal cues required, tactile cues required, and needs further education  HOME EXERCISE PROGRAM: Stand 3x/day in stander   GOALS: Goals reviewed with patient? Yes  SHORT TERM GOALS: Target date: 08/08/2023    Patient will be independent in home exercise program to improve strength/mobility for better  functional independence with ADLs.  Baseline: 4/8: compliance Goal status: MET    LONG TERM GOALS: Target date: 10/04/2023    Patient will tolerate five consecutive stands with UE support from wheelchair to standing to improve functional mobility.  Baseline: unable to perform 4/8:unable to perform full stand Goal status: Ongoing   2.  Patient will ambulate 10 ft with bRW with wheelchair follow.  Baseline:  unable to ambulate 4/8: unable to ambulate Goal status: partially met  3.  Patient will improve  FIST score >6 points to demonstrate improved stability and ability to perform ADLs.  Baseline:  3/6: 39/56 4/8: 47/ 56  Goal status: MET  4.  Patient will perform toileting with mod I at home for improved independence.  Baseline: 3/6: requires assistance 4/8: requires dependence on machine Goal status: Partially Met    ASSESSMENT:  CLINICAL IMPRESSION: Patient is able to stand from wheelchair with pad in chair for first time with min A x 2 person assist indicating improved ability to self mobilize and increased LE strength. Patient is highly motivated throughout session. Patient will benefit from skilled physical therapy to improve mobility, stability, and independence.   OBJECTIVE IMPAIRMENTS: Abnormal gait, cardiopulmonary status limiting activity, decreased activity tolerance, decreased balance, decreased coordination, decreased endurance, decreased mobility, difficulty walking, decreased ROM, decreased strength, hypomobility, increased fascial restrictions, impaired perceived functional ability, impaired flexibility, impaired sensation, improper body mechanics, and postural dysfunction.   ACTIVITY LIMITATIONS: carrying, lifting, bending, sitting, standing, squatting, sleeping, stairs, transfers, bed mobility, continence, bathing, toileting, dressing, self feeding, reach over head, hygiene/grooming, locomotion level, and caring for others  PARTICIPATION LIMITATIONS: meal prep,  cleaning, laundry, interpersonal relationship, driving, shopping, community activity, and church  PERSONAL FACTORS: Age, Past/current experiences, Time since onset of injury/illness/exacerbation, Transportation, and 3+ comorbidities: ack pain, CBP, chronic L shoulder pain, galactorrhea, neuropathy, HPV, hypercholestermeia, HTN, MS, osteopenia, PONV, wrist fracture are also affecting patient's functional outcome.   REHAB POTENTIAL: Good  CLINICAL DECISION MAKING: Evolving/moderate complexity  EVALUATION COMPLEXITY: Moderate  PLAN:  PT FREQUENCY: 2x/week  PT DURATION: 12 weeks  PLANNED INTERVENTIONS: 97164- PT Re-evaluation, 97110-Therapeutic exercises, 97530- Therapeutic activity, 97112- Neuromuscular re-education, 97535- Self Care, 40981- Manual therapy, [phone]- Gait training, [phone]- Orthotic Fit/training, [phone]- Canalith repositioning, [id number]- Aquatic Therapy, [phone]- Electrical stimulation (unattended), [phone]- Electrical stimulation (manual), [id number]- Ultrasound, [id number]- Traction (mechanical), Patient/Family education, Balance training, Stair training, Taping, Dry Needling, Joint mobilization, Joint manipulation, Spinal manipulation, Spinal mobilization, Scar mobilization, Compression bandaging, Vestibular training, Visual/preceptual remediation/compensation, Cognitive remediation, DME instructions, Cryotherapy, Moist heat, and Biofeedback  PLAN FOR NEXT SESSION: sit to stands; try supported walking   Dolan Xia  Brain Cahill, PT, DPT Physical Therapist - [facility]  Outpatient Physical Therapy- [facility] [phone]    09/13/2023, 11:10 AM

## 2023-09-13 ENCOUNTER — Ambulatory Visit: Payer: PPO

## 2023-09-13 DIAGNOSIS — G35 Multiple sclerosis: Secondary | ICD-10-CM

## 2023-09-13 DIAGNOSIS — R2681 Unsteadiness on feet: Secondary | ICD-10-CM

## 2023-09-13 DIAGNOSIS — R262 Difficulty in walking, not elsewhere classified: Secondary | ICD-10-CM

## 2023-09-13 DIAGNOSIS — M6281 Muscle weakness (generalized): Secondary | ICD-10-CM | POA: Diagnosis not present

## 2023-09-17 DIAGNOSIS — R7989 Other specified abnormal findings of blood chemistry: Secondary | ICD-10-CM | POA: Diagnosis not present

## 2023-09-17 NOTE — Therapy (Signed)
 OUTPATIENT PHYSICAL THERAPY NEURO TREATMENT     Patient Name: Lisa Williams MRN: [id number] DOB:09-Jan-1961, 63 y.o., female Today's Date: 09/18/2023   PCP: Overton Blotter  REFERRING PROVIDER: Overton Blotter  END OF SESSION:  PT End of Session - 09/18/23 1055     Visit Number 19    Number of Visits 24    Date for PT Re-Evaluation 10/04/23    PT Start Time 1015    PT Stop Time 1059    PT Time Calculation (min) 44 min    Equipment Utilized During Treatment Gait belt    Activity Tolerance Patient tolerated treatment well;No increased pain    Behavior During Therapy [facility] for tasks assessed/performed                             Past Medical History:  Diagnosis Date   Abdominal pain, right upper quadrant    Back pain    Calculus of kidney 12/09/2013   Chronic back pain    unspecified   Chronic left shoulder pain 07/19/2015   Complication of anesthesia    Functional disorder of bladder    other   Galactorrhea 11/26/2014   Chronic    Hereditary and idiopathic neuropathy 08/19/2013   History of kidney stones    HPV test positive    Hypercholesteremia 08/19/2013   Hypertension    Incomplete bladder emptying    Microscopic hematuria    MS (multiple sclerosis) (HCC)    Muscle spasticity 05/21/2014   Nonspecific findings on examination of urine    other   Osteopenia    PONV (postoperative nausea and vomiting)    Status post laparoscopic supracervical hysterectomy 11/26/2014   Tobacco user 11/26/2014   Wrist fracture    Past Surgical History:  Procedure Laterality Date   bilateral tubal ligation  1996   BREAST CYST EXCISION Left 2002   CYST EXCISION Left 05/10/2022   Procedure: CYST REMOVAL;  Surgeon: Eldred Grego, MD;  Location: [facility] ORS;  Service: General;  Laterality: Left;   FRACTURE SURGERY     KNEE SURGERY     right   LAPAROSCOPIC SUPRACERVICAL HYSTERECTOMY  08/05/2013   ORIF WRIST FRACTURE Left 01/17/2017    Procedure: OPEN REDUCTION INTERNAL FIXATION (ORIF) WRIST FRACTURE;  Surgeon: Jerlyn Moons, MD;  Location: [facility] ORS;  Service: Orthopedics;  Laterality: Left;   RADIOLOGY WITH ANESTHESIA N/A 03/18/2020   Procedure: MRI WITH ANESTHESIA CERVICAL SPINE AND BRAIN  WITH AND WITHOUT CONTRAST;  Surgeon: Radiologist, Medication, MD;  Location: [facility] OR;  Service: Radiology;  Laterality: N/A;   TUBAL LIGATION Bilateral    VAGINAL HYSTERECTOMY  03/2006   Patient Active Problem List   Diagnosis Date Noted   Sinus tachycardia 07/07/2023   Hypokalemia 07/29/2022   Weakness of both lower extremities 07/29/2022   Abscess of left groin 11/15/2021   Abnormal LFTs 11/15/2021   Acute respiratory disease due to COVID-19 virus 11/21/2020   Weakness    Hypoalbuminemia due to protein-calorie malnutrition [facility])    Neurogenic bowel    Neurogenic bladder    Labile blood pressure    Neuropathic pain    Abscess of female pelvis    SVT (supraventricular tachycardia) (HCC)    Radial styloid tenosynovitis 03/12/2018   Wheelchair dependence 02/27/2018   Localized osteoporosis with current pathological fracture with routine healing 01/19/2017   Wrist fracture 01/16/2017   Sprain of ankle 03/23/2016   Closed fracture of lateral malleolus 03/16/2016  Health care maintenance 01/24/2016   Blood pressure elevated without history of HTN 10/25/2015   Essential hypertension 10/25/2015   Multiple sclerosis (HCC) 10/02/2015   Chronic left shoulder pain 07/19/2015   Multiple sclerosis exacerbation (HCC) 07/14/2015   MS (multiple sclerosis) (HCC) 11/26/2014   Increased body mass index 11/26/2014   HPV test positive 11/26/2014   Status post laparoscopic supracervical hysterectomy 11/26/2014   Galactorrhea 11/26/2014   Back ache 05/21/2014   Adiposity 05/21/2014   Disordered sleep 05/21/2014   Muscle spasticity 05/21/2014   Spasticity 05/21/2014   Calculus of kidney 12/09/2013   Renal colic 12/09/2013    Hypercholesteremia 08/19/2013   Hereditary and idiopathic neuropathy 08/19/2013   Hypercholesterolemia without hypertriglyceridemia 08/19/2013   Bladder infection, chronic 07/25/2012   Disorder of bladder function 07/25/2012   Incomplete bladder emptying 07/25/2012   Microscopic hematuria 07/25/2012   Right upper quadrant pain 07/25/2012    ONSET DATE: 1995  REFERRING DIAG: MS  THERAPY DIAG:  Muscle weakness (generalized)  Multiple sclerosis exacerbation (HCC)  Unsteadiness on feet  Difficulty in walking, not elsewhere classified  Rationale for Evaluation and Treatment: Rehabilitation  SUBJECTIVE:                                                                                                                                                                                             SUBJECTIVE STATEMENT: Patient reports she is feeling good despite the rain.  Pt accompanied by: self  PERTINENT HISTORY:  Patient is returning to PT s/p hospitalization.  s/p ORIF of R tibia shaft fracture 09/23/2022. Patient has weakness in BLE with RLE>LLE. She drives with hand controls. Patient has been diagnosed with MS in 1995. PMH includes: back pain, CBP, chronic L shoulder pain, galactorrhea, neuropathy, HPV, hypercholestermeia, HTN, MS, osteopenia, PONV, wrist fracture. Additional order for other closed fracture of proximal end of R tibia with routine healing. Still has to use [employer].   PAIN:  Are you having pain? Occasional pain in RLE; primarily in knee  PRECAUTIONS: Fall  RED FLAGS: None   WEIGHT BEARING RESTRICTIONS: No  FALLS: Has patient fallen in last 6 months? No  LIVING ENVIRONMENT: Lives with: lives with their family Lives in: House/apartment Stairs: ramp Has following equipment at home: Otho Blitz - 2 wheeled, Wheelchair (manual), shower chair, and Grab bars  PLOF: Independent with household mobility with device  PATIENT GOALS: to get her independence back. To be able  to get into car, toilet, and get dressed independently  OBJECTIVE:  Note: Objective measures were completed at Evaluation unless otherwise noted.  DIAGNOSTIC FINDINGS: MRI of the brain 03/18/2020 showed multiple  T2/FLAIR hyperintense foci in the periventricular, juxtacortical and deep white matter.  There were no infratentorial lesions noted.  None of the foci enhanced.   Due to severe claustrophobia, the study was done with conscious sedation in the hospital   COGNITION: Overall cognitive status: Within functional limits for tasks assessed   SENSATION: Lack of sensation in feet, loss in bilateral lateral aspect of knee  COORDINATION: Does not have the strength for functional LE heel slide test     MUSCLE TONE: BLE mild tone    POSTURE: rounded shoulders, forward head, anterior pelvic tilt, and weight shift left    LOWER EXTREMITY MMT:    MMT Right Eval Left Eval  Hip flexion 0.9 1.5  Hip extension    Hip abduction 1.8 2.6  Hip adduction 3.1 1.9  Hip internal rotation    Hip external rotation    Knee flexion 0.9 1.5  Knee extension 0.8 1.2  Ankle dorsiflexion    Ankle plantarflexion    Ankle inversion    Ankle eversion    (Blank rows = not tested)  BED MOBILITY:  Assess in future session due to limited time  TRANSFERS: Assistive device utilized: Bariatric RW with sit to stands, slide board to table  Sit to stand: unable to reach full stand Stand to sit: unable to reach full stand  Chair to chair: slide board with CGA     GAIT: Unable to ambulate at this time.   FUNCTIONAL TESTS:  Sit to stand: tricep press with BUE; unable to bring arm to walker.    Function In Sitting Test (FIST)  (1/2 femur on surface; hips/knees flexed to 90deg)   - indicate bed or mat table / step stool if used  SCORING KEY: 4 = Independent (completes task independently & successfully) 3 = Verbal Cues/Increased Time (completes task independently & successfully and only needs  more time/cues) 2 = Upper Extremity Support (must use UE for support or assistance to complete successfully) 1 = Needs Assistance (unable to complete w/o physical assist; DOCUMENT LEVEL: min, mod, max) 0 = Dependent (requires complete physical assist; unable to complete successfully even w/ physical assist)  Randomly Administer Once Throughout Exam  4 - Anterior Nudge (superior sternum)  4 - Posterior Nudge (between scapular spines)  4 - Lateral Nudge (to dominant side at acromion)     4 - Static sitting (30 seconds)  4 - Sitting, shake 'no' (left and right)  4 - Sitting, eyes closed (30 seconds)   0 - Sitting, lift foot (dominant side, lift foot 1 inch twice)    2 - Pick up object from behind (object at midline, hands breadth posterior)  3 - Forward reach (use dominant arm, must complete full motion) 2 - Lateral reach (use dominant arm, clear opposite ischial tuberosity) 2 - Pick up object from floor (from between feet)   2 - Posterior scooting (move backwards 2 inches)  2 - Anterior scooting (move forward 2 inches)  2 - Lateral scooting (move to dominant side 2 inches)    TOTAL = 39/56  Notes/comments: slide board tranfer to/from table   MCD > 5 points MCID for IP REHAB > 6 points  Function In Sitting Test (FIST) 08/14/23 (1/2 femur on surface; hips/knees flexed to 90deg)   - indicate bed or mat table / step stool if used  SCORING KEY: 4 = Independent (completes task independently & successfully) 3 = Verbal Cues/Increased Time (completes task independently & successfully and only needs more time/cues)  2 = Upper Extremity Support (must use UE for support or assistance to complete successfully) 1 = Needs Assistance (unable to complete w/o physical assist; DOCUMENT LEVEL: min, mod, max) 0 = Dependent (requires complete physical assist; unable to complete successfully even w/ physical assist)  Randomly Administer Once Throughout Exam  4 - Anterior Nudge (superior sternum)  4  - Posterior Nudge (between scapular spines)  4 - Lateral Nudge (to dominant side at acromion)     4 - Static sitting (30 seconds)  4 - Sitting, shake 'no' (left and right)  4 - Sitting, eyes closed (30 seconds)   0 - Sitting, lift foot (dominant side, lift foot 1 inch twice)    4 - Pick up object from behind (object at midline, hands breadth posterior)  4 - Forward reach (use dominant arm, must complete full motion) 4 - Lateral reach (use dominant arm, clear opposite ischial tuberosity) 2 - Pick up object from floor (from between feet)   3 - Posterior scooting (move backwards 2 inches)  3 - Anterior scooting (move forward 2 inches)  3 - Lateral scooting (move to dominant side 2 inches)    TOTAL = 47/56    MCD > 5 points MCID for IP REHAB > 6 points                                                                                                                              TREATMENT DATE: 09/18/23  TherAct:  All activities performed with gait belt and CGA unless otherwise noted.  In // bars: Sit to stand : 7 attempts to full stand total; focus on weight shift and use of LE's; changing foot position for optimal position Sit to stand: ambulate one length of // bars with challenge shifting weight onto RLE for LLE propulsion close CGA with wheelchair follow. Partial sit to stand with focus on LE only 5x; ; 2 sets  Therex Isometric press into PT foot 10x each LE Adduction isometric 10x Abduction isometric 10x    PATIENT EDUCATION: Education details: goals, POC, HEP  Person educated: Patient Education method: Explanation, Demonstration, Tactile cues, Verbal cues, and Handouts Education comprehension: verbalized understanding, returned demonstration, verbal cues required, tactile cues required, and needs further education  HOME EXERCISE PROGRAM: Stand 3x/day in stander   GOALS: Goals reviewed with patient? Yes  SHORT TERM GOALS: Target date: 08/08/2023    Patient will be  independent in home exercise program to improve strength/mobility for better functional independence with ADLs.  Baseline: 4/8: compliance Goal status: MET    LONG TERM GOALS: Target date: 10/04/2023    Patient will tolerate five consecutive stands with UE support from wheelchair to standing to improve functional mobility.  Baseline: unable to perform 4/8:unable to perform full stand Goal status: Ongoing   2.  Patient will ambulate 10 ft with bRW with wheelchair follow.  Baseline:  unable to ambulate 4/8: unable to ambulate Goal status: partially met  3.  Patient will improve FIST score >6 points to demonstrate improved stability and ability to perform ADLs.  Baseline:  3/6: 39/56 4/8: 47/ 56  Goal status: MET  4.  Patient will perform toileting with mod I at home for improved independence.  Baseline: 3/6: requires assistance 4/8: requires dependence on machine Goal status: Partially Met    ASSESSMENT:  CLINICAL IMPRESSION: Patient presents with excellent motivation throughout session. She performed increased duration of sit to stands with almost reaching full stand 80% of the time. Changing foot positions to find optimal LE activation tolerated well. . Patient will benefit from skilled physical therapy to improve mobility, stability, and independence.   OBJECTIVE IMPAIRMENTS: Abnormal gait, cardiopulmonary status limiting activity, decreased activity tolerance, decreased balance, decreased coordination, decreased endurance, decreased mobility, difficulty walking, decreased ROM, decreased strength, hypomobility, increased fascial restrictions, impaired perceived functional ability, impaired flexibility, impaired sensation, improper body mechanics, and postural dysfunction.   ACTIVITY LIMITATIONS: carrying, lifting, bending, sitting, standing, squatting, sleeping, stairs, transfers, bed mobility, continence, bathing, toileting, dressing, self feeding, reach over head,  hygiene/grooming, locomotion level, and caring for others  PARTICIPATION LIMITATIONS: meal prep, cleaning, laundry, interpersonal relationship, driving, shopping, community activity, and church  PERSONAL FACTORS: Age, Past/current experiences, Time since onset of injury/illness/exacerbation, Transportation, and 3+ comorbidities: ack pain, CBP, chronic L shoulder pain, galactorrhea, neuropathy, HPV, hypercholestermeia, HTN, MS, osteopenia, PONV, wrist fracture are also affecting patient's functional outcome.   REHAB POTENTIAL: Good  CLINICAL DECISION MAKING: Evolving/moderate complexity  EVALUATION COMPLEXITY: Moderate  PLAN:  PT FREQUENCY: 2x/week  PT DURATION: 12 weeks  PLANNED INTERVENTIONS: 97164- PT Re-evaluation, 97110-Therapeutic exercises, 97530- Therapeutic activity, 97112- Neuromuscular re-education, 97535- Self Care, 16109- Manual therapy, [phone]- Gait training, [phone]- Orthotic Fit/training, [phone]- Canalith repositioning, [id number]- Aquatic Therapy, [phone]- Electrical stimulation (unattended), [phone]- Electrical stimulation (manual), [id number]- Ultrasound, [id number]- Traction (mechanical), Patient/Family education, Balance training, Stair training, Taping, Dry Needling, Joint mobilization, Joint manipulation, Spinal manipulation, Spinal mobilization, Scar mobilization, Compression bandaging, Vestibular training, Visual/preceptual remediation/compensation, Cognitive remediation, DME instructions, Cryotherapy, Moist heat, and Biofeedback  PLAN FOR NEXT SESSION: sit to stands; try supported walking   Aaryana Betke  Brain Cahill, PT, DPT Physical Therapist - [facility]  Outpatient Physical Therapy- Main Campus [phone]    09/18/2023, 10:59 AM

## 2023-09-18 ENCOUNTER — Encounter

## 2023-09-18 ENCOUNTER — Ambulatory Visit: Payer: PPO

## 2023-09-18 ENCOUNTER — Encounter: Payer: Self-pay | Admitting: Neurology

## 2023-09-18 DIAGNOSIS — M6281 Muscle weakness (generalized): Secondary | ICD-10-CM | POA: Diagnosis not present

## 2023-09-18 DIAGNOSIS — G35 Multiple sclerosis: Secondary | ICD-10-CM

## 2023-09-18 DIAGNOSIS — R2681 Unsteadiness on feet: Secondary | ICD-10-CM

## 2023-09-18 DIAGNOSIS — R262 Difficulty in walking, not elsewhere classified: Secondary | ICD-10-CM

## 2023-09-19 ENCOUNTER — Other Ambulatory Visit: Payer: Self-pay | Admitting: Internal Medicine

## 2023-09-19 DIAGNOSIS — R7989 Other specified abnormal findings of blood chemistry: Secondary | ICD-10-CM

## 2023-09-20 ENCOUNTER — Ambulatory Visit: Payer: PPO

## 2023-09-21 ENCOUNTER — Ambulatory Visit

## 2023-09-21 DIAGNOSIS — R278 Other lack of coordination: Secondary | ICD-10-CM

## 2023-09-21 DIAGNOSIS — M6281 Muscle weakness (generalized): Secondary | ICD-10-CM | POA: Diagnosis not present

## 2023-09-21 DIAGNOSIS — G35 Multiple sclerosis: Secondary | ICD-10-CM

## 2023-09-21 NOTE — Therapy (Signed)
 OUTPATIENT OCCUPATIONAL THERAPY NEURO TREATMENT NOTE   Patient Name: Lisa Williams MRN: [id number] DOB:12-21-1960, 63 y.o., female Today's Date: 09/21/2023  PCP: Dr. Overton Blotter   Neurologist Dr. Octavio Ben at [facility] REFERRING PROVIDER: Dr. Overton Blotter    END OF SESSION:  OT End of Session - 09/21/23 1110     Visit Number 8    Number of Visits 24    Date for OT Re-Evaluation 11/08/23    Progress Note Due on Visit 10    OT Start Time 1015    OT Stop Time 1100    OT Time Calculation (min) 45 min    Equipment Utilized During Treatment manual wc    Activity Tolerance Patient tolerated treatment well    Behavior During Therapy [facility] for tasks assessed/performed            Past Medical History:  Diagnosis Date   Abdominal pain, right upper quadrant    Back pain    Calculus of kidney 12/09/2013   Chronic back pain    unspecified   Chronic left shoulder pain 07/19/2015   Complication of anesthesia    Functional disorder of bladder    other   Galactorrhea 11/26/2014   Chronic    Hereditary and idiopathic neuropathy 08/19/2013   History of kidney stones    HPV test positive    Hypercholesteremia 08/19/2013   Hypertension    Incomplete bladder emptying    Microscopic hematuria    MS (multiple sclerosis) (HCC)    Muscle spasticity 05/21/2014   Nonspecific findings on examination of urine    other   Osteopenia    PONV (postoperative nausea and vomiting)    Status post laparoscopic supracervical hysterectomy 11/26/2014   Tobacco user 11/26/2014   Wrist fracture    Past Surgical History:  Procedure Laterality Date   bilateral tubal ligation  1996   BREAST CYST EXCISION Left 2002   CYST EXCISION Left 05/10/2022   Procedure: CYST REMOVAL;  Surgeon: Eldred Grego, MD;  Location: [facility] ORS;  Service: General;  Laterality: Left;   FRACTURE SURGERY     KNEE SURGERY     right   LAPAROSCOPIC SUPRACERVICAL HYSTERECTOMY  08/05/2013   ORIF WRIST FRACTURE  Left 01/17/2017   Procedure: OPEN REDUCTION INTERNAL FIXATION (ORIF) WRIST FRACTURE;  Surgeon: Jerlyn Moons, MD;  Location: [facility] ORS;  Service: Orthopedics;  Laterality: Left;   RADIOLOGY WITH ANESTHESIA N/A 03/18/2020   Procedure: MRI WITH ANESTHESIA CERVICAL SPINE AND BRAIN  WITH AND WITHOUT CONTRAST;  Surgeon: Radiologist, Medication, MD;  Location: [facility] OR;  Service: Radiology;  Laterality: N/A;   TUBAL LIGATION Bilateral    VAGINAL HYSTERECTOMY  03/2006   Patient Active Problem List   Diagnosis Date Noted   Sinus tachycardia 07/07/2023   Hypokalemia 07/29/2022   Weakness of both lower extremities 07/29/2022   Abscess of left groin 11/15/2021   Abnormal LFTs 11/15/2021   Acute respiratory disease due to COVID-19 virus 11/21/2020   Weakness    Hypoalbuminemia due to protein-calorie malnutrition [facility])    Neurogenic bowel    Neurogenic bladder    Labile blood pressure    Neuropathic pain    Abscess of female pelvis    SVT (supraventricular tachycardia) (HCC)    Radial styloid tenosynovitis 03/12/2018   Wheelchair dependence 02/27/2018   Localized osteoporosis with current pathological fracture with routine healing 01/19/2017   Wrist fracture 01/16/2017   Sprain of ankle 03/23/2016   Closed fracture of lateral malleolus 03/16/2016  Health care maintenance 01/24/2016   Blood pressure elevated without history of HTN 10/25/2015   Essential hypertension 10/25/2015   Multiple sclerosis (HCC) 10/02/2015   Chronic left shoulder pain 07/19/2015   Multiple sclerosis exacerbation (HCC) 07/14/2015   MS (multiple sclerosis) (HCC) 11/26/2014   Increased body mass index 11/26/2014   HPV test positive 11/26/2014   Status post laparoscopic supracervical hysterectomy 11/26/2014   Galactorrhea 11/26/2014   Back ache 05/21/2014   Adiposity 05/21/2014   Disordered sleep 05/21/2014   Muscle spasticity 05/21/2014   Spasticity 05/21/2014   Calculus of kidney 12/09/2013   Renal colic  12/09/2013   Hypercholesteremia 08/19/2013   Hereditary and idiopathic neuropathy 08/19/2013   Hypercholesterolemia without hypertriglyceridemia 08/19/2013   Bladder infection, chronic 07/25/2012   Disorder of bladder function 07/25/2012   Incomplete bladder emptying 07/25/2012   Microscopic hematuria 07/25/2012   Right upper quadrant pain 07/25/2012   ONSET DATE: 5/24 [name] and broke R leg which caused beginning of ADL decline; diagnosed with MS in 1995)  REFERRING DIAG: MS  THERAPY DIAG:  Muscle weakness (generalized)  Other lack of coordination  Multiple sclerosis exacerbation (HCC)  Rationale for Evaluation and Treatment: Rehabilitation  SUBJECTIVE:  SUBJECTIVE STATEMENT: Pt reports having a nice weekend celebrating her mother with other family members on Mother's Day.  Pt accompanied by: self  PERTINENT HISTORY: Pt reports increased difficulty with basic self care tasks since breaking her R leg last May.  Since that time, mobility and ADLs have declined, with pt requiring assist from private caregivers and family to manage bathing, toileting, dressing, and functional transfers.  PRECAUTIONS: Fall  WEIGHT BEARING RESTRICTIONS: No  PAIN: 09/21/23: No pain reported today Are you having pain? No; occasional pain in R lower back, but not today  FALLS: Has patient fallen in last 6 months? No Last fall was May 17 of 2024  LIVING ENVIRONMENT: Lives with: lives with their family (including mother who has dementia and sister [name])  Lives in: 2 level home but pt resides on main level Stairs: ramp  Has following equipment at home: Wheelchair (manual), Shower bench, and hand held shower shower, 1 grab bar in the shower, 3in1 commode, sit to stand lift (electric), FWW, hospital bed  PLOF: modified indep with ADL/IADLs prior to May of 1610   PATIENT GOALS: Increase independence with basic self care tasks  OBJECTIVE:  Note: Objective measures were completed at Evaluation  unless otherwise noted.  HAND DOMINANCE: Right  ADLs:  Overall ADLs: assist provided from paid caregiver and sister Transfers/ambulation related to ADLs: sit to stand lift for all transfers, set up for sliding board in/out of bed  Eating: indep  Grooming: modified indep (wc level in mom's bathroom)  UB Dressing: set up (can't access her bedroom closet)  LB Dressing: able to sit on side of bed to don all LB clothing; assist to hike pants/underwear Toileting: assist with clothing management d/t standing within electric lift Bathing: bed bath with sister helping to wash backside Tub Shower transfers: N/A; pt has walk in shower in mom's bathroom (wc does fit in this bathroom but pt reports inability to transfer from wc and requires arm rests on a bench for a successful transfer attempt from any DME).  Tub bench is in pt's bathroom, but lift does not fit through bathroom doorway and pt reports inability to transfer up from tub bench (currently unable to manage either transfer)   Equipment: see above  IADLs: Shopping: [employer] for shopping Light housekeeping: modified  indep from wc level Meal Prep: modified indep from wc level Community mobility: relies on community transportation or family members Medication management: indep Landscape architect: indep Handwriting: NT; pt denies any FMC challenges  MOBILITY STATUS: Hx of falls  POSTURE COMMENTS:  Rounded shoulders, forward head, anterior pelvic tilt, and weight shift left   ACTIVITY TOLERANCE: Activity tolerance: Per PT; currently standing 30-60 sec within Light Gait harness/lift; currently non-ambulatory  UPPER EXTREMITY ROM:  BUEs WFL  UPPER EXTREMITY MMT:     MMT Right eval Left eval  Shoulder flexion 4+ 4  Shoulder abduction 4+ 4  Shoulder adduction    Shoulder extension    Shoulder internal rotation 4+ 4+  Shoulder external rotation 4 4  Middle trapezius    Lower trapezius    Elbow flexion 4+ 4+  Elbow extension  4+ 4+  Wrist flexion 4+ 4+  Wrist extension 4+ 4+  Wrist ulnar deviation    Wrist radial deviation    Wrist pronation    Wrist supination    (Blank rows = not tested)  HAND FUNCTION/COORDINATION:  R/L WNL; pt denies any coordination deficits/ able to manipulate pills/clothing fasteners/etc without difficulty  SENSATION: WFL  EDEMA: No visible edema in BUEs  MUSCLE TONE: BUEs WNL  COGNITION: Overall cognitive status: Within functional limits for tasks assessed  VISION: wears glasses all the time, no reports of diplopia  PERCEPTION: Not tested  PRAXIS: WFL  OBSERVATIONS:  Pt pleasant, cooperative, and motivated to work towards improving indep with ADLs.                                                                                                     TREATMENT DATE: 09/21/23 Self Care: -Practice trials with scoot transfer attempt from wc to bathroom toilet; mod vc for positioning, hand placement, and anterior leaning.   -Partial clothing management completed from wc level, noting pt's ability to lower waist of pants completely from R buttock, but partially from L hip.    Therapeutic Activity: -Anterior WS trials in prep for scoot pivot transfers to commode, working to clear buttocks from wc seat; visual target placed in front of pt to promote greater anterior lean and tactile cues for engaging quads rather than relying solely on BUEs to lift. -Core stability: promoted forward, floor level, above the shoulder, and lateral reaching patterns without UE support to reach for ball with R/LUEs.  Intermittent vc to reduce support from backrest of chair upon return to upright sitting to further challenge core stability.   Therapeutic Exercise: Reviewed core stability exercises for HEP: without UE support (arms crossed at chest level) forward and lateral crunches from, trunk rotations, BUE reaching to floor/ceiling without UE support; vc for maximizing safety/reducing fall risk by  completing these exercises in wc in front of bed   PATIENT EDUCATION: Education details: HEP progression: core stability exercises Person educated: Patient Education method: Explanation, demo, vc Education comprehension: verbalized understanding, further training needed  HOME EXERCISE PROGRAM: IR A/AAROM to promote shoulder flexibility for peri care/bathing  GOALS: Goals reviewed with patient? Yes  SHORT TERM  GOALS: Target date: 09/27/23  Pt will perform bed bath with set up.  Baseline: Eval: Min-mod A for posterior washing Goal status: INITIAL  2.  Pt will utilize sliding board for wc<>drop arm commode transfer with SBA. Baseline: Eval: Currently using electric lift for transfer to [facility] Goal status: INITIAL  3.  Pt will be indep to perform HEP for maintaining BUE strength for ADLs and functional transfers. Baseline: Eval: HEP not yet initiated Goal status: INITIAL  LONG TERM GOALS: Target date: 11/08/23  Pt will perform sink bath with min A. Baseline: Eval: Currently bed bathing d/t inability to stand at sink without electric lift (lift does not fit into pt's bathroom).   Goal status: INITIAL  2.  Pt will perform wc<>tub bench transfer with min A. Baseline: Eval: Unable Goal status: INITIAL  3.  Pt will perform squat pivot transfer wc<>BSC with SBA. Baseline: Eval: Eval: Currently using electric lift for transfer to [facility] Goal status: INITIAL  4. Pt will perform scoot transfer wc<>toilet using grab bars to allow toileting in community setting.  Baseline: Eval: Pt limits community outings d/t requiring use of lift for [facility] transfers.   Goal status: INITIAL  5.  Pt will tolerate standing x1 min to manage clothing in prep for toileting. Baseline: Eval: Currently standing in electric lift only, or within parallel bars without lift Goal status: INITIAL   ASSESSMENT: CLINICAL IMPRESSION: Unable to fully complete scoot pivot wc<>toilet transfer this date, with pt struggling to  boost self up over wc wheel to reach toilet seat.  OT reassured pt of multiple factors possibly contributing to unsuccessful transfer today, including hot weather, time passed since this transfer was last attempted, and no "warm up" drills to practice anterior WS.  Transitioned from bathroom to therapy gym to practice forward WS, with pt responding well to visual, verbal, and tactile cues to promote increased lift of buttocks from wc in prep for scoot pivot transfers.  Pt tolerated core stability exercises well and reviewed core strengthening exercises and activities for home, reinforcing importance of continued core strengthening needed to improve control/stability/confidence with all transfer attempts and to ease ADL completion.  Pt receptive to all and able to return demo of exercises.  Pt will continue to benefit from skilled OT to work towards above noted goals in OT poc, working to maximize indep with daily tasks while reducing burden of care on caregivers.   PERFORMANCE DEFICITS: in functional skills including ADLs, IADLs, strength, pain, flexibility, Gross motor control, mobility, balance, body mechanics, endurance, and decreased knowledge of use of DME, and psychosocial skills including coping strategies, environmental adaptation, habits, and routines and behaviors.   IMPAIRMENTS: are limiting patient from ADLs, IADLs, and social participation.   CO-MORBIDITIES: has co-morbidities such as neuropathy, back pain, obesity, MS, HTN that affects occupational performance. Patient will benefit from skilled OT to address above impairments and improve overall function.  MODIFICATION OR ASSISTANCE TO COMPLETE EVALUATION: No modification of tasks or assist necessary to complete an evaluation.  OT OCCUPATIONAL PROFILE AND HISTORY: Detailed assessment: Review of records and additional review of physical, cognitive, psychosocial history related to current functional performance.  CLINICAL DECISION MAKING:  Moderate - several treatment options, min-mod task modification necessary  REHAB POTENTIAL: Good  EVALUATION COMPLEXITY: Moderate    PLAN:  OT FREQUENCY: 2x/week  OT DURATION: 12 weeks  PLANNED INTERVENTIONS: 97168 OT Re-evaluation, 97535 self care/ADL training, 16109 therapeutic exercise, 97530 therapeutic activity, 97112 neuromuscular re-education, 97140 manual therapy, 97116 gait training, 60454  moist heat, 97010 cryotherapy, balance training, functional mobility training, psychosocial skills training, energy conservation, coping strategies training, patient/family education, and DME and/or AE instructions  RECOMMENDED OTHER SERVICES: None at this time (Pt currently receiving PT services in this clinic)  CONSULTED AND AGREED WITH PLAN OF CARE: Patient  PLAN FOR NEXT SESSION: see above  Marcus Sewer, MS, OTR/L  Casandra Claw, OT 09/21/2023, 11:12 AM

## 2023-09-24 NOTE — Therapy (Signed)
 OUTPATIENT PHYSICAL THERAPY NEURO TREATMENT / Physical Therapy Progress Note   Dates of reporting period 08/14/23   to   09/25/23       Patient Name: Lisa Williams MRN: [id number] DOB:12/06/1960, 63 y.o., female Today's Date: 09/25/2023   PCP: Overton Blotter  REFERRING PROVIDER: Overton Blotter  END OF SESSION:  PT End of Session - 09/25/23 1015     Visit Number 20    Number of Visits 24    Date for PT Re-Evaluation 10/04/23    PT Start Time 1015    PT Stop Time 1055    PT Time Calculation (min) 40 min    Equipment Utilized During Treatment Gait belt    Activity Tolerance Patient tolerated treatment well;No increased pain    Behavior During Therapy [facility] for tasks assessed/performed                              Past Medical History:  Diagnosis Date   Abdominal pain, right upper quadrant    Back pain    Calculus of kidney 12/09/2013   Chronic back pain    unspecified   Chronic left shoulder pain 07/19/2015   Complication of anesthesia    Functional disorder of bladder    other   Galactorrhea 11/26/2014   Chronic    Hereditary and idiopathic neuropathy 08/19/2013   History of kidney stones    HPV test positive    Hypercholesteremia 08/19/2013   Hypertension    Incomplete bladder emptying    Microscopic hematuria    MS (multiple sclerosis) (HCC)    Muscle spasticity 05/21/2014   Nonspecific findings on examination of urine    other   Osteopenia    PONV (postoperative nausea and vomiting)    Status post laparoscopic supracervical hysterectomy 11/26/2014   Tobacco user 11/26/2014   Wrist fracture    Past Surgical History:  Procedure Laterality Date   bilateral tubal ligation  1996   BREAST CYST EXCISION Left 2002   CYST EXCISION Left 05/10/2022   Procedure: CYST REMOVAL;  Surgeon: Eldred Grego, MD;  Location: [facility] ORS;  Service: General;  Laterality: Left;   FRACTURE SURGERY     KNEE SURGERY     right    LAPAROSCOPIC SUPRACERVICAL HYSTERECTOMY  08/05/2013   ORIF WRIST FRACTURE Left 01/17/2017   Procedure: OPEN REDUCTION INTERNAL FIXATION (ORIF) WRIST FRACTURE;  Surgeon: Jerlyn Moons, MD;  Location: [facility] ORS;  Service: Orthopedics;  Laterality: Left;   RADIOLOGY WITH ANESTHESIA N/A 03/18/2020   Procedure: MRI WITH ANESTHESIA CERVICAL SPINE AND BRAIN  WITH AND WITHOUT CONTRAST;  Surgeon: Radiologist, Medication, MD;  Location: [facility] OR;  Service: Radiology;  Laterality: N/A;   TUBAL LIGATION Bilateral    VAGINAL HYSTERECTOMY  03/2006   Patient Active Problem List   Diagnosis Date Noted   Sinus tachycardia 07/07/2023   Hypokalemia 07/29/2022   Weakness of both lower extremities 07/29/2022   Abscess of left groin 11/15/2021   Abnormal LFTs 11/15/2021   Acute respiratory disease due to COVID-19 virus 11/21/2020   Weakness    Hypoalbuminemia due to protein-calorie malnutrition [facility])    Neurogenic bowel    Neurogenic bladder    Labile blood pressure    Neuropathic pain    Abscess of female pelvis    SVT (supraventricular tachycardia) (HCC)    Radial styloid tenosynovitis 03/12/2018   Wheelchair dependence 02/27/2018   Localized osteoporosis with current pathological fracture with routine  healing 01/19/2017   Wrist fracture 01/16/2017   Sprain of ankle 03/23/2016   Closed fracture of lateral malleolus 03/16/2016   Health care maintenance 01/24/2016   Blood pressure elevated without history of HTN 10/25/2015   Essential hypertension 10/25/2015   Multiple sclerosis (HCC) 10/02/2015   Chronic left shoulder pain 07/19/2015   Multiple sclerosis exacerbation (HCC) 07/14/2015   MS (multiple sclerosis) (HCC) 11/26/2014   Increased body mass index 11/26/2014   HPV test positive 11/26/2014   Status post laparoscopic supracervical hysterectomy 11/26/2014   Galactorrhea 11/26/2014   Back ache 05/21/2014   Adiposity 05/21/2014   Disordered sleep 05/21/2014   Muscle spasticity 05/21/2014    Spasticity 05/21/2014   Calculus of kidney 12/09/2013   Renal colic 12/09/2013   Hypercholesteremia 08/19/2013   Hereditary and idiopathic neuropathy 08/19/2013   Hypercholesterolemia without hypertriglyceridemia 08/19/2013   Bladder infection, chronic 07/25/2012   Disorder of bladder function 07/25/2012   Incomplete bladder emptying 07/25/2012   Microscopic hematuria 07/25/2012   Right upper quadrant pain 07/25/2012    ONSET DATE: 1995  REFERRING DIAG: MS  THERAPY DIAG:  Muscle weakness (generalized)  Multiple sclerosis exacerbation (HCC)  Difficulty in walking, not elsewhere classified  Other abnormalities of gait and mobility  Rationale for Evaluation and Treatment: Rehabilitation  SUBJECTIVE:                                                                                                                                                                                             SUBJECTIVE STATEMENT: Patient reports she is feeling very "off" and "weak".  Patient goes Friday for Ultrasound of liver.  Pt accompanied by: self  PERTINENT HISTORY:  Patient is returning to PT s/p hospitalization.  s/p ORIF of R tibia shaft fracture 09/23/2022. Patient has weakness in BLE with RLE>LLE. She drives with hand controls. Patient has been diagnosed with MS in 1995. PMH includes: back pain, CBP, chronic L shoulder pain, galactorrhea, neuropathy, HPV, hypercholestermeia, HTN, MS, osteopenia, PONV, wrist fracture. Additional order for other closed fracture of proximal end of R tibia with routine healing. Still has to use [employer].   PAIN:  Are you having pain? Occasional pain in RLE; primarily in knee  PRECAUTIONS: Fall  RED FLAGS: None   WEIGHT BEARING RESTRICTIONS: No  FALLS: Has patient fallen in last 6 months? No  LIVING ENVIRONMENT: Lives with: lives with their family Lives in: House/apartment Stairs: ramp Has following equipment at home: Otho Blitz - 2 wheeled, Wheelchair  (manual), shower chair, and Grab bars  PLOF: Independent with household mobility with device  PATIENT GOALS: to get her independence back. To  be able to get into car, toilet, and get dressed independently  OBJECTIVE:  Note: Objective measures were completed at Evaluation unless otherwise noted.  DIAGNOSTIC FINDINGS: MRI of the brain 03/18/2020 showed multiple T2/FLAIR hyperintense foci in the periventricular, juxtacortical and deep white matter.  There were no infratentorial lesions noted.  None of the foci enhanced.   Due to severe claustrophobia, the study was done with conscious sedation in the hospital   COGNITION: Overall cognitive status: Within functional limits for tasks assessed   SENSATION: Lack of sensation in feet, loss in bilateral lateral aspect of knee  COORDINATION: Does not have the strength for functional LE heel slide test     MUSCLE TONE: BLE mild tone    POSTURE: rounded shoulders, forward head, anterior pelvic tilt, and weight shift left    LOWER EXTREMITY MMT:    MMT Right Eval Left Eval  Hip flexion 0.9 1.5  Hip extension    Hip abduction 1.8 2.6  Hip adduction 3.1 1.9  Hip internal rotation    Hip external rotation    Knee flexion 0.9 1.5  Knee extension 0.8 1.2  Ankle dorsiflexion    Ankle plantarflexion    Ankle inversion    Ankle eversion    (Blank rows = not tested)  BED MOBILITY:  Assess in future session due to limited time  TRANSFERS: Assistive device utilized: Bariatric RW with sit to stands, slide board to table  Sit to stand: unable to reach full stand Stand to sit: unable to reach full stand  Chair to chair: slide board with CGA     GAIT: Unable to ambulate at this time.   FUNCTIONAL TESTS:  Sit to stand: tricep press with BUE; unable to bring arm to walker.    Function In Sitting Test (FIST)  (1/2 femur on surface; hips/knees flexed to 90deg)   - indicate bed or mat table / step stool if used  SCORING KEY: 4 =  Independent (completes task independently & successfully) 3 = Verbal Cues/Increased Time (completes task independently & successfully and only needs more time/cues) 2 = Upper Extremity Support (must use UE for support or assistance to complete successfully) 1 = Needs Assistance (unable to complete w/o physical assist; DOCUMENT LEVEL: min, mod, max) 0 = Dependent (requires complete physical assist; unable to complete successfully even w/ physical assist)  Randomly Administer Once Throughout Exam  4 - Anterior Nudge (superior sternum)  4 - Posterior Nudge (between scapular spines)  4 - Lateral Nudge (to dominant side at acromion)     4 - Static sitting (30 seconds)  4 - Sitting, shake 'no' (left and right)  4 - Sitting, eyes closed (30 seconds)   0 - Sitting, lift foot (dominant side, lift foot 1 inch twice)    2 - Pick up object from behind (object at midline, hands breadth posterior)  3 - Forward reach (use dominant arm, must complete full motion) 2 - Lateral reach (use dominant arm, clear opposite ischial tuberosity) 2 - Pick up object from floor (from between feet)   2 - Posterior scooting (move backwards 2 inches)  2 - Anterior scooting (move forward 2 inches)  2 - Lateral scooting (move to dominant side 2 inches)    TOTAL = 39/56  Notes/comments: slide board tranfer to/from table   MCD > 5 points MCID for IP REHAB > 6 points  Function In Sitting Test (FIST) 08/14/23 (1/2 femur on surface; hips/knees flexed to 90deg)   - indicate bed  or mat table / step stool if used  SCORING KEY: 4 = Independent (completes task independently & successfully) 3 = Verbal Cues/Increased Time (completes task independently & successfully and only needs more time/cues) 2 = Upper Extremity Support (must use UE for support or assistance to complete successfully) 1 = Needs Assistance (unable to complete w/o physical assist; DOCUMENT LEVEL: min, mod, max) 0 = Dependent (requires complete physical  assist; unable to complete successfully even w/ physical assist)  Randomly Administer Once Throughout Exam  4 - Anterior Nudge (superior sternum)  4 - Posterior Nudge (between scapular spines)  4 - Lateral Nudge (to dominant side at acromion)     4 - Static sitting (30 seconds)  4 - Sitting, shake 'no' (left and right)  4 - Sitting, eyes closed (30 seconds)   0 - Sitting, lift foot (dominant side, lift foot 1 inch twice)    4 - Pick up object from behind (object at midline, hands breadth posterior)  4 - Forward reach (use dominant arm, must complete full motion) 4 - Lateral reach (use dominant arm, clear opposite ischial tuberosity) 2 - Pick up object from floor (from between feet)   3 - Posterior scooting (move backwards 2 inches)  3 - Anterior scooting (move forward 2 inches)  3 - Lateral scooting (move to dominant side 2 inches)    TOTAL = 47/56    MCD > 5 points MCID for IP REHAB > 6 points                                                                                                                              TREATMENT DATE: 09/25/23 Physical therapy treatment session today consisted of completing assessment of goals and administration of testing as demonstrated and documented in flow sheet, treatment, and goals section of this note. Addition treatments may be found below.   Function In Sitting Test (FIST)  (1/2 femur on surface; hips/knees flexed to 90deg)   - indicate bed or mat table / step stool if used  SCORING KEY: 4 = Independent (completes task independently & successfully) 3 = Verbal Cues/Increased Time (completes task independently & successfully and only needs more time/cues) 2 = Upper Extremity Support (must use UE for support or assistance to complete successfully) 1 = Needs Assistance (unable to complete w/o physical assist; DOCUMENT LEVEL: min, mod, max) 0 = Dependent (requires complete physical assist; unable to complete successfully even w/ physical  assist)  Randomly Administer Once Throughout Exam  4 - Anterior Nudge (superior sternum)  4 - Posterior Nudge (between scapular spines)  4 - Lateral Nudge (to dominant side at acromion)     4 - Static sitting (30 seconds)  4 - Sitting, shake 'no' (left and right)  4 - Sitting, eyes closed (30 seconds)   1 - Sitting, lift foot (dominant side, lift foot 1 inch twice)    4 - Pick up object from behind (object at midline,  hands breadth posterior)  4 - Forward reach (use dominant arm, must complete full motion) 3 - Lateral reach (use dominant arm, clear opposite ischial tuberosity) 3 - Pick up object from floor (from between feet)   3 - Posterior scooting (move backwards 2 inches)  3 - Anterior scooting (move forward 2 inches)  3 - Lateral scooting (move to dominant side 2 inches)    TOTAL = 48/56   MCD > 5 points MCID for IP REHAB > 6 points  TherAct: Slide board transfer to from table with CGA and set up assistance  Seated stabilization on edge of table with focus on stability with movement x multiple reps   Therex Isometric press into PT foot 10x each LE Adduction isometric 10x Abduction isometric 10x  Contract relax against PT resistance 15x each LE    PATIENT EDUCATION: Education details: goals, POC, HEP  Person educated: Patient Education method: Explanation, Demonstration, Tactile cues, Verbal cues, and Handouts Education comprehension: verbalized understanding, returned demonstration, verbal cues required, tactile cues required, and needs further education  HOME EXERCISE PROGRAM: Stand 3x/day in stander   GOALS: Goals reviewed with patient? Yes  SHORT TERM GOALS: Target date: 08/08/2023    Patient will be independent in home exercise program to improve strength/mobility for better functional independence with ADLs.  Baseline: 4/8: compliance Goal status: MET    LONG TERM GOALS: Target date: 10/04/2023    Patient will tolerate five consecutive stands  with UE support from wheelchair to standing to improve functional mobility.  Baseline: unable to perform 4/8:unable to perform full stand 5/20: perform next session  Goal status: Ongoing   2.  Patient will ambulate 10 ft with bRW with wheelchair follow.  Baseline:  unable to ambulate 4/8: unable to ambulate 5/20: able to ambulate length of // bars in previous session Goal status: partially met  3.  Patient will improve FIST score >6 points to demonstrate improved stability and ability to perform ADLs.  Baseline:  3/6: 39/56 4/8: 47/ 56 5/20: 48/56 Goal status: MET  4.  Patient will perform toileting with mod I at home for improved independence.  Baseline: 3/6: requires assistance 4/8: requires dependence on machine 5/20: requires assistance with use of wipe buddy.  Goal status: Partially Met    ASSESSMENT:  CLINICAL IMPRESSION: Patient's condition has the potential to improve in response to therapy. Maximum improvement is yet to be obtained. The anticipated improvement is attainable and reasonable in a generally predictable time.  Patient's FIST score has improved demonstrating steady progress each testing period. Patient is fatigued this session, will test the sit to stand goal next session.  Patient will benefit from skilled physical therapy to improve mobility, stability, and independence.   OBJECTIVE IMPAIRMENTS: Abnormal gait, cardiopulmonary status limiting activity, decreased activity tolerance, decreased balance, decreased coordination, decreased endurance, decreased mobility, difficulty walking, decreased ROM, decreased strength, hypomobility, increased fascial restrictions, impaired perceived functional ability, impaired flexibility, impaired sensation, improper body mechanics, and postural dysfunction.   ACTIVITY LIMITATIONS: carrying, lifting, bending, sitting, standing, squatting, sleeping, stairs, transfers, bed mobility, continence, bathing, toileting, dressing, self feeding,  reach over head, hygiene/grooming, locomotion level, and caring for others  PARTICIPATION LIMITATIONS: meal prep, cleaning, laundry, interpersonal relationship, driving, shopping, community activity, and church  PERSONAL FACTORS: Age, Past/current experiences, Time since onset of injury/illness/exacerbation, Transportation, and 3+ comorbidities: ack pain, CBP, chronic L shoulder pain, galactorrhea, neuropathy, HPV, hypercholestermeia, HTN, MS, osteopenia, PONV, wrist fracture are also affecting patient's functional outcome.   REHAB POTENTIAL: Good  CLINICAL DECISION MAKING: Evolving/moderate complexity  EVALUATION COMPLEXITY: Moderate  PLAN:  PT FREQUENCY: 2x/week  PT DURATION: 12 weeks  PLANNED INTERVENTIONS: 97164- PT Re-evaluation, 97110-Therapeutic exercises, 97530- Therapeutic activity, 97112- Neuromuscular re-education, 97535- Self Care, 40981- Manual therapy, [phone]- Gait training, [phone]- Orthotic Fit/training, [phone]- Canalith repositioning, [id number]- Aquatic Therapy, [phone]- Electrical stimulation (unattended), [phone]- Electrical stimulation (manual), [id number]- Ultrasound, [id number]- Traction (mechanical), Patient/Family education, Balance training, Stair training, Taping, Dry Needling, Joint mobilization, Joint manipulation, Spinal manipulation, Spinal mobilization, Scar mobilization, Compression bandaging, Vestibular training, Visual/preceptual remediation/compensation, Cognitive remediation, DME instructions, Cryotherapy, Moist heat, and Biofeedback  PLAN FOR NEXT SESSION: sit to stands; try supported walking   Carlesha Seiple  Brain Cahill, PT, DPT Physical Therapist - [facility]  Outpatient Physical Therapy- Main Campus [phone]    09/25/2023, 11:00 AM

## 2023-09-25 ENCOUNTER — Ambulatory Visit: Payer: PPO

## 2023-09-25 ENCOUNTER — Ambulatory Visit

## 2023-09-25 DIAGNOSIS — R262 Difficulty in walking, not elsewhere classified: Secondary | ICD-10-CM

## 2023-09-25 DIAGNOSIS — G35 Multiple sclerosis: Secondary | ICD-10-CM

## 2023-09-25 DIAGNOSIS — R2689 Other abnormalities of gait and mobility: Secondary | ICD-10-CM

## 2023-09-25 DIAGNOSIS — M6281 Muscle weakness (generalized): Secondary | ICD-10-CM

## 2023-09-26 NOTE — Therapy (Signed)
 OUTPATIENT PHYSICAL THERAPY NEURO TREATMENT        Patient Name: Lisa Williams MRN: [id number] DOB:1961/01/26, 63 y.o., female Today's Date: 09/27/2023   PCP: Overton Blotter  REFERRING PROVIDER: Overton Blotter  END OF SESSION:  PT End of Session - 09/27/23 1014     Visit Number 21    Number of Visits 24    Date for PT Re-Evaluation 10/04/23    PT Start Time 1015    PT Stop Time 1055    PT Time Calculation (min) 40 min    Equipment Utilized During Treatment Gait belt    Activity Tolerance Patient tolerated treatment well;No increased pain    Behavior During Therapy [facility] for tasks assessed/performed                               Past Medical History:  Diagnosis Date   Abdominal pain, right upper quadrant    Back pain    Calculus of kidney 12/09/2013   Chronic back pain    unspecified   Chronic left shoulder pain 07/19/2015   Complication of anesthesia    Functional disorder of bladder    other   Galactorrhea 11/26/2014   Chronic    Hereditary and idiopathic neuropathy 08/19/2013   History of kidney stones    HPV test positive    Hypercholesteremia 08/19/2013   Hypertension    Incomplete bladder emptying    Microscopic hematuria    MS (multiple sclerosis) (HCC)    Muscle spasticity 05/21/2014   Nonspecific findings on examination of urine    other   Osteopenia    PONV (postoperative nausea and vomiting)    Status post laparoscopic supracervical hysterectomy 11/26/2014   Tobacco user 11/26/2014   Wrist fracture    Past Surgical History:  Procedure Laterality Date   bilateral tubal ligation  1996   BREAST CYST EXCISION Left 2002   CYST EXCISION Left 05/10/2022   Procedure: CYST REMOVAL;  Surgeon: Eldred Grego, MD;  Location: [facility] ORS;  Service: General;  Laterality: Left;   FRACTURE SURGERY     KNEE SURGERY     right   LAPAROSCOPIC SUPRACERVICAL HYSTERECTOMY  08/05/2013   ORIF WRIST FRACTURE Left 01/17/2017    Procedure: OPEN REDUCTION INTERNAL FIXATION (ORIF) WRIST FRACTURE;  Surgeon: Jerlyn Moons, MD;  Location: [facility] OR[facility];  Service: Orthopedics;  Laterality: Left;   RADIOLOGY WITH ANESTHESIA N/A 03/18/2020   Procedure: MRI WITH ANESTHESIA CERVICAL SPINE AND BRAIN  WITH AND WITHOUT CONTRAST;  Surgeon: Radiologist, Medication, MD;  Location: [facility] OR;  Service: Radiology;  Laterality: N/A;   TUBAL LIGATION Bilateral    VAGINAL HYSTERECTOMY  03/2006   Patient Active Problem List   Diagnosis Date Noted   Sinus tachycardia 07/07/2023   Hypokalemia 07/29/2022   Weakness of both lower extremities 07/29/2022   Abscess of left groin 11/15/2021   Abnormal LFTs 11/15/2021   Acute respiratory disease due to COVID-19 virus 11/21/2020   Weakness    Hypoalbuminemia due to protein-calorie malnutrition [facility])    Neurogenic bowel    Neurogenic bladder    Labile blood pressure    Neuropathic pain    Abscess of female pelvis    SVT (supraventricular tachycardia) (HCC)    Radial styloid tenosynovitis 03/12/2018   Wheelchair dependence 02/27/2018   Localized osteoporosis with current pathological fracture with routine healing 01/19/2017   Wrist fracture 01/16/2017   Sprain of ankle 03/23/2016   Closed  fracture of lateral malleolus 03/16/2016   Health care maintenance 01/24/2016   Blood pressure elevated without history of HTN 10/25/2015   Essential hypertension 10/25/2015   Multiple sclerosis (HCC) 10/02/2015   Chronic left shoulder pain 07/19/2015   Multiple sclerosis exacerbation (HCC) 07/14/2015   MS (multiple sclerosis) (HCC) 11/26/2014   Increased body mass index 11/26/2014   HPV test positive 11/26/2014   Status post laparoscopic supracervical hysterectomy 11/26/2014   Galactorrhea 11/26/2014   Back ache 05/21/2014   Adiposity 05/21/2014   Disordered sleep 05/21/2014   Muscle spasticity 05/21/2014   Spasticity 05/21/2014   Calculus of kidney 12/09/2013   Renal colic 12/09/2013    Hypercholesteremia 08/19/2013   Hereditary and idiopathic neuropathy 08/19/2013   Hypercholesterolemia without hypertriglyceridemia 08/19/2013   Bladder infection, chronic 07/25/2012   Disorder of bladder function 07/25/2012   Incomplete bladder emptying 07/25/2012   Microscopic hematuria 07/25/2012   Right upper quadrant pain 07/25/2012    ONSET DATE: 1995  REFERRING DIAG: MS  THERAPY DIAG:  Muscle weakness (generalized)  Multiple sclerosis exacerbation (HCC)  Difficulty in walking, not elsewhere classified  Other abnormalities of gait and mobility  Rationale for Evaluation and Treatment: Rehabilitation  SUBJECTIVE:                                                                                                                                                                                             SUBJECTIVE STATEMENT: Patient reports she has been having some quad pain in R quad.  Pt accompanied by: self  PERTINENT HISTORY:  Patient is returning to PT s/p hospitalization.  s/p ORIF of R tibia shaft fracture 09/23/2022. Patient has weakness in BLE with RLE>LLE. She drives with hand controls. Patient has been diagnosed with MS in 1995. PMH includes: back pain, CBP, chronic L shoulder pain, galactorrhea, neuropathy, HPV, hypercholestermeia, HTN, MS, osteopenia, PONV, wrist fracture. Additional order for other closed fracture of proximal end of R tibia with routine healing. Still has to use [employer].   PAIN:  Are you having pain? Occasional pain in RLE; primarily in knee  PRECAUTIONS: Fall  RED FLAGS: None   WEIGHT BEARING RESTRICTIONS: No  FALLS: Has patient fallen in last 6 months? No  LIVING ENVIRONMENT: Lives with: lives with their family Lives in: House/apartment Stairs: ramp Has following equipment at home: Otho Blitz - 2 wheeled, Wheelchair (manual), shower chair, and Grab bars  PLOF: Independent with household mobility with device  PATIENT GOALS: to get her  independence back. To be able to get into car, toilet, and get dressed independently  OBJECTIVE:  Note: Objective measures were completed at Evaluation  unless otherwise noted.  DIAGNOSTIC FINDINGS: MRI of the brain 03/18/2020 showed multiple T2/FLAIR hyperintense foci in the periventricular, juxtacortical and deep white matter.  There were no infratentorial lesions noted.  None of the foci enhanced.   Due to severe claustrophobia, the study was done with conscious sedation in the hospital   COGNITION: Overall cognitive status: Within functional limits for tasks assessed   SENSATION: Lack of sensation in feet, loss in bilateral lateral aspect of knee  COORDINATION: Does not have the strength for functional LE heel slide test     MUSCLE TONE: BLE mild tone    POSTURE: rounded shoulders, forward head, anterior pelvic tilt, and weight shift left    LOWER EXTREMITY MMT:    MMT Right Eval Left Eval  Hip flexion 0.9 1.5  Hip extension    Hip abduction 1.8 2.6  Hip adduction 3.1 1.9  Hip internal rotation    Hip external rotation    Knee flexion 0.9 1.5  Knee extension 0.8 1.2  Ankle dorsiflexion    Ankle plantarflexion    Ankle inversion    Ankle eversion    (Blank rows = not tested)  BED MOBILITY:  Assess in future session due to limited time  TRANSFERS: Assistive device utilized: Bariatric RW with sit to stands, slide board to table  Sit to stand: unable to reach full stand Stand to sit: unable to reach full stand  Chair to chair: slide board with CGA     GAIT: Unable to ambulate at this time.   FUNCTIONAL TESTS:  Sit to stand: tricep press with BUE; unable to bring arm to walker.    Function In Sitting Test (FIST)  (1/2 femur on surface; hips/knees flexed to 90deg)   - indicate bed or mat table / step stool if used  SCORING KEY: 4 = Independent (completes task independently & successfully) 3 = Verbal Cues/Increased Time (completes task independently &  successfully and only needs more time/cues) 2 = Upper Extremity Support (must use UE for support or assistance to complete successfully) 1 = Needs Assistance (unable to complete w/o physical assist; DOCUMENT LEVEL: min, mod, max) 0 = Dependent (requires complete physical assist; unable to complete successfully even w/ physical assist)  Randomly Administer Once Throughout Exam  4 - Anterior Nudge (superior sternum)  4 - Posterior Nudge (between scapular spines)  4 - Lateral Nudge (to dominant side at acromion)     4 - Static sitting (30 seconds)  4 - Sitting, shake 'no' (left and right)  4 - Sitting, eyes closed (30 seconds)   0 - Sitting, lift foot (dominant side, lift foot 1 inch twice)    2 - Pick up object from behind (object at midline, hands breadth posterior)  3 - Forward reach (use dominant arm, must complete full motion) 2 - Lateral reach (use dominant arm, clear opposite ischial tuberosity) 2 - Pick up object from floor (from between feet)   2 - Posterior scooting (move backwards 2 inches)  2 - Anterior scooting (move forward 2 inches)  2 - Lateral scooting (move to dominant side 2 inches)    TOTAL = 39/56  Notes/comments: slide board tranfer to/from table   MCD > 5 points MCID for IP REHAB > 6 points  Function In Sitting Test (FIST) 08/14/23 (1/2 femur on surface; hips/knees flexed to 90deg)   - indicate bed or mat table / step stool if used  SCORING KEY: 4 = Independent (completes task independently & successfully) 3 =  Verbal Cues/Increased Time (completes task independently & successfully and only needs more time/cues) 2 = Upper Extremity Support (must use UE for support or assistance to complete successfully) 1 = Needs Assistance (unable to complete w/o physical assist; DOCUMENT LEVEL: min, mod, max) 0 = Dependent (requires complete physical assist; unable to complete successfully even w/ physical assist)  Randomly Administer Once Throughout Exam  4 - Anterior  Nudge (superior sternum)  4 - Posterior Nudge (between scapular spines)  4 - Lateral Nudge (to dominant side at acromion)     4 - Static sitting (30 seconds)  4 - Sitting, shake 'no' (left and right)  4 - Sitting, eyes closed (30 seconds)   0 - Sitting, lift foot (dominant side, lift foot 1 inch twice)    4 - Pick up object from behind (object at midline, hands breadth posterior)  4 - Forward reach (use dominant arm, must complete full motion) 4 - Lateral reach (use dominant arm, clear opposite ischial tuberosity) 2 - Pick up object from floor (from between feet)   3 - Posterior scooting (move backwards 2 inches)  3 - Anterior scooting (move forward 2 inches)  3 - Lateral scooting (move to dominant side 2 inches)    TOTAL = 47/56    MCD > 5 points MCID for IP REHAB > 6 points                                                                                                                              TREATMENT DATE: 09/27/23   TherAct: In // bars with UE support:  -sit to stand with cue forward weight shift and hand placement. Cues for LE recruitment;  -upon standing weight shift to L and R side with focus on full weight acceptance 10x each side -weight shift to RLE attempt to step forward with LLE second stand - 5 partial stands with focus on LE recruitment and forward momentum; 2 sets  Therex Large swiss ball: -forward taps/backwards taps 10x -diagonal taps R/L forward/backward for optimal reach 10x each side -isometric press into swiss ball 10x each Le     PATIENT EDUCATION: Education details: goals, POC, HEP  Person educated: Patient Education method: Explanation, Demonstration, Tactile cues, Verbal cues, and Handouts Education comprehension: verbalized understanding, returned demonstration, verbal cues required, tactile cues required, and needs further education  HOME EXERCISE PROGRAM: Stand 3x/day in stander   GOALS: Goals reviewed with patient? Yes  SHORT  TERM GOALS: Target date: 08/08/2023    Patient will be independent in home exercise program to improve strength/mobility for better functional independence with ADLs.  Baseline: 4/8: compliance Goal status: MET    LONG TERM GOALS: Target date: 10/04/2023    Patient will tolerate five consecutive stands with UE support from wheelchair to standing to improve functional mobility.  Baseline: unable to perform 4/8:unable to perform full stand 5/20: perform next session  Goal status: Ongoing   2.  Patient will  ambulate 10 ft with bRW with wheelchair follow.  Baseline:  unable to ambulate 4/8: unable to ambulate 5/20: able to ambulate length of // bars in previous session Goal status: partially met  3.  Patient will improve FIST score >6 points to demonstrate improved stability and ability to perform ADLs.  Baseline:  3/6: 39/56 4/8: 47/ 56 5/20: 48/56 Goal status: MET  4.  Patient will perform toileting with mod I at home for improved independence.  Baseline: 3/6: requires assistance 4/8: requires dependence on machine 5/20: requires assistance with use of wipe buddy.  Goal status: Partially Met    ASSESSMENT:  CLINICAL IMPRESSION: Patient is able to stabilize herself in standing with max cueing for weight shift onto RLE. She is eager to progress her independence. Therapist discussed letter of necessity for standing frame. Patient is highly motivated throughout session and is fatigued by end of session.  Patient will benefit from skilled physical therapy to improve mobility, stability, and independence.   OBJECTIVE IMPAIRMENTS: Abnormal gait, cardiopulmonary status limiting activity, decreased activity tolerance, decreased balance, decreased coordination, decreased endurance, decreased mobility, difficulty walking, decreased ROM, decreased strength, hypomobility, increased fascial restrictions, impaired perceived functional ability, impaired flexibility, impaired sensation, improper body  mechanics, and postural dysfunction.   ACTIVITY LIMITATIONS: carrying, lifting, bending, sitting, standing, squatting, sleeping, stairs, transfers, bed mobility, continence, bathing, toileting, dressing, self feeding, reach over head, hygiene/grooming, locomotion level, and caring for others  PARTICIPATION LIMITATIONS: meal prep, cleaning, laundry, interpersonal relationship, driving, shopping, community activity, and church  PERSONAL FACTORS: Age, Past/current experiences, Time since onset of injury/illness/exacerbation, Transportation, and 3+ comorbidities: ack pain, CBP, chronic L shoulder pain, galactorrhea, neuropathy, HPV, hypercholestermeia, HTN, MS, osteopenia, PONV, wrist fracture are also affecting patient's functional outcome.   REHAB POTENTIAL: Good  CLINICAL DECISION MAKING: Evolving/moderate complexity  EVALUATION COMPLEXITY: Moderate  PLAN:  PT FREQUENCY: 2x/week  PT DURATION: 12 weeks  PLANNED INTERVENTIONS: 97164- PT Re-evaluation, 97110-Therapeutic exercises, 97530- Therapeutic activity, 97112- Neuromuscular re-education, 97535- Self Care, 16109- Manual therapy, [phone]- Gait training, [phone]- Orthotic Fit/training, [phone]- Canalith repositioning, [id number]- Aquatic Therapy, [phone]- Electrical stimulation (unattended), [phone]- Electrical stimulation (manual), [id number]- Ultrasound, [id number]- Traction (mechanical), Patient/Family education, Balance training, Stair training, Taping, Dry Needling, Joint mobilization, Joint manipulation, Spinal manipulation, Spinal mobilization, Scar mobilization, Compression bandaging, Vestibular training, Visual/preceptual remediation/compensation, Cognitive remediation, DME instructions, Cryotherapy, Moist heat, and Biofeedback  PLAN FOR NEXT SESSION: sit to stands; try supported walking   Arielis Leonhart  Brain Cahill, PT, DPT Physical Therapist - [facility]  Outpatient Physical Therapy- Main Campus [phone]    09/27/2023, 12:51  PM

## 2023-09-27 ENCOUNTER — Ambulatory Visit: Payer: PPO

## 2023-09-27 DIAGNOSIS — M6281 Muscle weakness (generalized): Secondary | ICD-10-CM

## 2023-09-27 DIAGNOSIS — R2689 Other abnormalities of gait and mobility: Secondary | ICD-10-CM

## 2023-09-27 DIAGNOSIS — G35 Multiple sclerosis: Secondary | ICD-10-CM

## 2023-09-27 DIAGNOSIS — R262 Difficulty in walking, not elsewhere classified: Secondary | ICD-10-CM

## 2023-09-27 NOTE — Therapy (Signed)
 OUTPATIENT OCCUPATIONAL THERAPY NEURO TREATMENT NOTE   Patient Name: Lisa Williams Stage MRN: [id number] DOB:31-Oct-1960, 63 y.o., female Today's Date: 09/27/2023  PCP: Dr. Overton Blotter   Neurologist Dr. Octavio Ben at [facility] REFERRING PROVIDER: Dr. Overton Blotter    END OF SESSION:  OT End of Session - 09/27/23 1030     Visit Number 9    Number of Visits 24    Date for OT Re-Evaluation 11/08/23    Progress Note Due on Visit 10    OT Start Time 1105    OT Stop Time 1145    OT Time Calculation (min) 40 min    Equipment Utilized During Treatment manual wc    Activity Tolerance Patient tolerated treatment well    Behavior During Therapy [facility] for tasks assessed/performed            Past Medical History:  Diagnosis Date   Abdominal pain, right upper quadrant    Back pain    Calculus of kidney 12/09/2013   Chronic back pain    unspecified   Chronic left shoulder pain 07/19/2015   Complication of anesthesia    Functional disorder of bladder    other   Galactorrhea 11/26/2014   Chronic    Hereditary and idiopathic neuropathy 08/19/2013   History of kidney stones    HPV test positive    Hypercholesteremia 08/19/2013   Hypertension    Incomplete bladder emptying    Microscopic hematuria    MS (multiple sclerosis) (HCC)    Muscle spasticity 05/21/2014   Nonspecific findings on examination of urine    other   Osteopenia    PONV (postoperative nausea and vomiting)    Status post laparoscopic supracervical hysterectomy 11/26/2014   Tobacco user 11/26/2014   Wrist fracture    Past Surgical History:  Procedure Laterality Date   bilateral tubal ligation  1996   BREAST CYST EXCISION Left 2002   CYST EXCISION Left 05/10/2022   Procedure: CYST REMOVAL;  Surgeon: Eldred Grego, MD;  Location: [facility] ORS;  Service: General;  Laterality: Left;   FRACTURE SURGERY     KNEE SURGERY     right   LAPAROSCOPIC SUPRACERVICAL HYSTERECTOMY  08/05/2013   ORIF WRIST FRACTURE  Left 01/17/2017   Procedure: OPEN REDUCTION INTERNAL FIXATION (ORIF) WRIST FRACTURE;  Surgeon: Jerlyn Moons, MD;  Location: [facility] ORS;  Service: Orthopedics;  Laterality: Left;   RADIOLOGY WITH ANESTHESIA N/A 03/18/2020   Procedure: MRI WITH ANESTHESIA CERVICAL SPINE AND BRAIN  WITH AND WITHOUT CONTRAST;  Surgeon: Radiologist, Medication, MD;  Location: [facility] OR;  Service: Radiology;  Laterality: N/A;   TUBAL LIGATION Bilateral    VAGINAL HYSTERECTOMY  03/2006   Patient Active Problem List   Diagnosis Date Noted   Sinus tachycardia 07/07/2023   Hypokalemia 07/29/2022   Weakness of both lower extremities 07/29/2022   Abscess of left groin 11/15/2021   Abnormal LFTs 11/15/2021   Acute respiratory disease due to COVID-19 virus 11/21/2020   Weakness    Hypoalbuminemia due to protein-calorie malnutrition [facility])    Neurogenic bowel    Neurogenic bladder    Labile blood pressure    Neuropathic pain    Abscess of female pelvis    SVT (supraventricular tachycardia) (HCC)    Radial styloid tenosynovitis 03/12/2018   Wheelchair dependence 02/27/2018   Localized osteoporosis with current pathological fracture with routine healing 01/19/2017   Wrist fracture 01/16/2017   Sprain of ankle 03/23/2016   Closed fracture of lateral malleolus 03/16/2016  Health care maintenance 01/24/2016   Blood pressure elevated without history of HTN 10/25/2015   Essential hypertension 10/25/2015   Multiple sclerosis (HCC) 10/02/2015   Chronic left shoulder pain 07/19/2015   Multiple sclerosis exacerbation (HCC) 07/14/2015   MS (multiple sclerosis) (HCC) 11/26/2014   Increased body mass index 11/26/2014   HPV test positive 11/26/2014   Status post laparoscopic supracervical hysterectomy 11/26/2014   Galactorrhea 11/26/2014   Back ache 05/21/2014   Adiposity 05/21/2014   Disordered sleep 05/21/2014   Muscle spasticity 05/21/2014   Spasticity 05/21/2014   Calculus of kidney 12/09/2013   Renal colic  12/09/2013   Hypercholesteremia 08/19/2013   Hereditary and idiopathic neuropathy 08/19/2013   Hypercholesterolemia without hypertriglyceridemia 08/19/2013   Bladder infection, chronic 07/25/2012   Disorder of bladder function 07/25/2012   Incomplete bladder emptying 07/25/2012   Microscopic hematuria 07/25/2012   Right upper quadrant pain 07/25/2012   ONSET DATE: 5/24 [name] and broke R leg which caused beginning of ADL decline; diagnosed with MS in 1995)  REFERRING DIAG: MS  THERAPY DIAG:  Muscle weakness (generalized)  Multiple sclerosis exacerbation (HCC)  Rationale for Evaluation and Treatment: Rehabilitation  SUBJECTIVE:  SUBJECTIVE STATEMENT: Pt stated, "I don't know what it is, but I'm really tired today and feeling weak." Pt accompanied by: self  PERTINENT HISTORY: Pt reports increased difficulty with basic self care tasks since breaking her R leg last May.  Since that time, mobility and ADLs have declined, with pt requiring assist from private caregivers and family to manage bathing, toileting, dressing, and functional transfers.  PRECAUTIONS: Fall  WEIGHT BEARING RESTRICTIONS: No  PAIN: 09/25/23: No pain reported today Are you having pain? No; occasional pain in R lower back, but not today  FALLS: Has patient fallen in last 6 months? No Last fall was May 17 of 2024  LIVING ENVIRONMENT: Lives with: lives with their family (including mother who has dementia and sister [name])  Lives in: 2 level home but pt resides on main level Stairs: ramp  Has following equipment at home: Wheelchair (manual), Shower bench, and hand held shower shower, 1 grab bar in the shower, 3in1 commode, sit to stand lift (electric), FWW, hospital bed  PLOF: modified indep with ADL/IADLs prior to May of 6962   PATIENT GOALS: Increase independence with basic self care tasks  OBJECTIVE:  Note: Objective measures were completed at Evaluation unless otherwise noted.  HAND DOMINANCE:  Right  ADLs:  Overall ADLs: assist provided from paid caregiver and sister Transfers/ambulation related to ADLs: sit to stand lift for all transfers, set up for sliding board in/out of bed  Eating: indep  Grooming: modified indep (wc level in mom's bathroom)  UB Dressing: set up (can't access her bedroom closet)  LB Dressing: able to sit on side of bed to don all LB clothing; assist to hike pants/underwear Toileting: assist with clothing management d/t standing within electric lift Bathing: bed bath with sister helping to wash backside Tub Shower transfers: N/A; pt has walk in shower in mom's bathroom (wc does fit in this bathroom but pt reports inability to transfer from wc and requires arm rests on a bench for a successful transfer attempt from any DME).  Tub bench is in pt's bathroom, but lift does not fit through bathroom doorway and pt reports inability to transfer up from tub bench (currently unable to manage either transfer)   Equipment: see above  IADLs: Shopping: [employer] for shopping Light housekeeping: modified indep from wc level Meal Prep:  modified indep from wc level Community mobility: relies on community transportation or family members Medication management: indep Landscape architect: indep Handwriting: NT; pt denies any [facility] challenges  MOBILITY STATUS: Hx of falls  POSTURE COMMENTS:  Rounded shoulders, forward head, anterior pelvic tilt, and weight shift left   ACTIVITY TOLERANCE: Activity tolerance: Per PT; currently standing 30-60 sec within Light Gait harness/lift; currently non-ambulatory  UPPER EXTREMITY ROM:  BUEs WFL  UPPER EXTREMITY MMT:     MMT Right eval Left eval  Shoulder flexion 4+ 4  Shoulder abduction 4+ 4  Shoulder adduction    Shoulder extension    Shoulder internal rotation 4+ 4+  Shoulder external rotation 4 4  Middle trapezius    Lower trapezius    Elbow flexion 4+ 4+  Elbow extension 4+ 4+  Wrist flexion 4+ 4+  Wrist  extension 4+ 4+  Wrist ulnar deviation    Wrist radial deviation    Wrist pronation    Wrist supination    (Blank rows = not tested)  HAND FUNCTION/COORDINATION:  R/L WNL; pt denies any coordination deficits/ able to manipulate pills/clothing fasteners/etc without difficulty  SENSATION: WFL  EDEMA: No visible edema in BUEs  MUSCLE TONE: BUEs WNL  COGNITION: Overall cognitive status: Within functional limits for tasks assessed  VISION: wears glasses all the time, no reports of diplopia  PERCEPTION: Not tested  PRAXIS: WFL  OBSERVATIONS:  Pt pleasant, cooperative, and motivated to work towards improving indep with ADLs.                                                                                                     TREATMENT DATE: 09/25/23 Therapeutic Activity: -Core stability: promoted forward and lateral (R/L) reaching with return to midline without UE support.  Pt reached to place Connect 4 pieces into game board. -Anterior WS trials in prep for scoot pivot transfers to commode, working to clear buttocks from wc seat; visual target placed in front of pt to promote greater anterior lean and tactile cues for engaging quads rather than relying solely on BUEs to lift.  PATIENT EDUCATION: Education details: Core stability exercises Person educated: Patient Education method: Explanation, demo, vc Education comprehension: verbalized understanding, further training needed  HOME EXERCISE PROGRAM: IR A/AAROM to promote shoulder flexibility for peri care/bathing  GOALS: Goals reviewed with patient? Yes  SHORT TERM GOALS: Target date: 09/27/23  Pt will perform bed bath with set up.  Baseline: Eval: Min-mod A for posterior washing Goal status: INITIAL  2.  Pt will utilize sliding board for wc<>drop arm commode transfer with SBA. Baseline: Eval: Currently using electric lift for transfer to [facility] Goal status: INITIAL  3.  Pt will be indep to perform HEP for maintaining BUE  strength for ADLs and functional transfers. Baseline: Eval: HEP not yet initiated Goal status: INITIAL  LONG TERM GOALS: Target date: 11/08/23  Pt will perform sink bath with min A. Baseline: Eval: Currently bed bathing d/t inability to stand at sink without electric lift (lift does not fit into pt's bathroom).   Goal status: INITIAL  2.  Pt will perform wc<>tub bench transfer with min A. Baseline: Eval: Unable Goal status: INITIAL  3.  Pt will perform squat pivot transfer wc<>BSC with SBA. Baseline: Eval: Eval: Currently using electric lift for transfer to [facility] Goal status: INITIAL  4. Pt will perform scoot transfer wc<>toilet using grab bars to allow toileting in community setting.  Baseline: Eval: Pt limits community outings d/t requiring use of lift for [facility] transfers.   Goal status: INITIAL  5.  Pt will tolerate standing x1 min to manage clothing in prep for toileting. Baseline: Eval: Currently standing in electric lift only, or within parallel bars without lift Goal status: INITIAL  ASSESSMENT: CLINICAL IMPRESSION: Pt reported that she was feeling quite weak today in her legs, but very willing to focus on core stability needed to improve functional transfers.  Pt tolerated forward and lateral reaching with Connect 4 activity noted above without UE support and intermittent rest breaks.  Pt able to reach at least 10" outside BOS and return to midline without UE support in all directions noted above, but bilat wc arm rests remained in place.  Pt continues to require at least mod multimodal cueing for anterior weight shift and engaging bilat quads in order to increase lift from chair in prep for squat pivot transfers to commode.  OT continues to encourage pt practice these weight shifts at home by positioning herself in her wc with her bed in front of her to prevent fall from wc.  Pt will continue to benefit from skilled OT to work towards above noted goals in OT poc, working to maximize  indep with daily tasks while reducing burden of care on caregivers.   PERFORMANCE DEFICITS: in functional skills including ADLs, IADLs, strength, pain, flexibility, Gross motor control, mobility, balance, body mechanics, endurance, and decreased knowledge of use of DME, and psychosocial skills including coping strategies, environmental adaptation, habits, and routines and behaviors.   IMPAIRMENTS: are limiting patient from ADLs, IADLs, and social participation.   CO-MORBIDITIES: has co-morbidities such as neuropathy, back pain, obesity, MS, HTN that affects occupational performance. Patient will benefit from skilled OT to address above impairments and improve overall function.  MODIFICATION OR ASSISTANCE TO COMPLETE EVALUATION: No modification of tasks or assist necessary to complete an evaluation.  OT OCCUPATIONAL PROFILE AND HISTORY: Detailed assessment: Review of records and additional review of physical, cognitive, psychosocial history related to current functional performance.  CLINICAL DECISION MAKING: Moderate - several treatment options, min-mod task modification necessary  REHAB POTENTIAL: Good  EVALUATION COMPLEXITY: Moderate    PLAN:  OT FREQUENCY: 2x/week  OT DURATION: 12 weeks  PLANNED INTERVENTIONS: 97168 OT Re-evaluation, 97535 self care/ADL training, 16109 therapeutic exercise, 97530 therapeutic activity, 97112 neuromuscular re-education, 97140 manual therapy, 97116 gait training, 60454 moist heat, 97010 cryotherapy, balance training, functional mobility training, psychosocial skills training, energy conservation, coping strategies training, patient/family education, and DME and/or AE instructions  RECOMMENDED OTHER SERVICES: None at this time (Pt currently receiving PT services in this clinic)  CONSULTED AND AGREED WITH PLAN OF CARE: Patient  PLAN FOR NEXT SESSION: see above  Marcus Sewer, MS, OTR/L  Casandra Claw, OT 09/27/2023, 10:31 AM

## 2023-09-27 NOTE — Therapy (Signed)
 OUTPATIENT PHYSICAL THERAPY NEURO TREATMENT        Patient Name: Lisa Williams MRN: [id number] DOB:03-30-1961, 63 y.o., female Today's Date: 10/02/2023   PCP: Overton Blotter  REFERRING PROVIDER: Overton Blotter  END OF SESSION:  PT End of Session - 10/02/23 1014     Visit Number 22    Number of Visits 24    Date for PT Re-Evaluation 10/04/23    PT Start Time 1015    PT Stop Time 1055    PT Time Calculation (min) 40 min    Equipment Utilized During Treatment Gait belt    Activity Tolerance Patient tolerated treatment well;No increased pain    Behavior During Therapy [facility] for tasks assessed/performed                                Past Medical History:  Diagnosis Date   Abdominal pain, right upper quadrant    Back pain    Calculus of kidney 12/09/2013   Chronic back pain    unspecified   Chronic left shoulder pain 07/19/2015   Complication of anesthesia    Functional disorder of bladder    other   Galactorrhea 11/26/2014   Chronic    Hereditary and idiopathic neuropathy 08/19/2013   History of kidney stones    HPV test positive    Hypercholesteremia 08/19/2013   Hypertension    Incomplete bladder emptying    Microscopic hematuria    MS (multiple sclerosis) (HCC)    Muscle spasticity 05/21/2014   Nonspecific findings on examination of urine    other   Osteopenia    PONV (postoperative nausea and vomiting)    Status post laparoscopic supracervical hysterectomy 11/26/2014   Tobacco user 11/26/2014   Wrist fracture    Past Surgical History:  Procedure Laterality Date   bilateral tubal ligation  1996   BREAST CYST EXCISION Left 2002   CYST EXCISION Left 05/10/2022   Procedure: CYST REMOVAL;  Surgeon: Eldred Grego, MD;  Location: [facility] ORS;  Service: General;  Laterality: Left;   FRACTURE SURGERY     KNEE SURGERY     right   LAPAROSCOPIC SUPRACERVICAL HYSTERECTOMY  08/05/2013   ORIF WRIST FRACTURE Left 01/17/2017    Procedure: OPEN REDUCTION INTERNAL FIXATION (ORIF) WRIST FRACTURE;  Surgeon: Jerlyn Moons, MD;  Location: [facility] ORS;  Service: Orthopedics;  Laterality: Left;   RADIOLOGY WITH ANESTHESIA N/A 03/18/2020   Procedure: MRI WITH ANESTHESIA CERVICAL SPINE AND BRAIN  WITH AND WITHOUT CONTRAST;  Surgeon: Radiologist, Medication, MD;  Location: [facility] OR;  Service: Radiology;  Laterality: N/A;   TUBAL LIGATION Bilateral    VAGINAL HYSTERECTOMY  03/2006   Patient Active Problem List   Diagnosis Date Noted   Sinus tachycardia 07/07/2023   Hypokalemia 07/29/2022   Weakness of both lower extremities 07/29/2022   Abscess of left groin 11/15/2021   Abnormal LFTs 11/15/2021   Acute respiratory disease due to COVID-19 virus 11/21/2020   Weakness    Hypoalbuminemia due to protein-calorie malnutrition [facility])    Neurogenic bowel    Neurogenic bladder    Labile blood pressure    Neuropathic pain    Abscess of female pelvis    SVT (supraventricular tachycardia) (HCC)    Radial styloid tenosynovitis 03/12/2018   Wheelchair dependence 02/27/2018   Localized osteoporosis with current pathological fracture with routine healing 01/19/2017   Wrist fracture 01/16/2017   Sprain of ankle 03/23/2016  Closed fracture of lateral malleolus 03/16/2016   Health care maintenance 01/24/2016   Blood pressure elevated without history of HTN 10/25/2015   Essential hypertension 10/25/2015   Multiple sclerosis (HCC) 10/02/2015   Chronic left shoulder pain 07/19/2015   Multiple sclerosis exacerbation (HCC) 07/14/2015   MS (multiple sclerosis) (HCC) 11/26/2014   Increased body mass index 11/26/2014   HPV test positive 11/26/2014   Status post laparoscopic supracervical hysterectomy 11/26/2014   Galactorrhea 11/26/2014   Back ache 05/21/2014   Adiposity 05/21/2014   Disordered sleep 05/21/2014   Muscle spasticity 05/21/2014   Spasticity 05/21/2014   Calculus of kidney 12/09/2013   Renal colic 12/09/2013    Hypercholesteremia 08/19/2013   Hereditary and idiopathic neuropathy 08/19/2013   Hypercholesterolemia without hypertriglyceridemia 08/19/2013   Bladder infection, chronic 07/25/2012   Disorder of bladder function 07/25/2012   Incomplete bladder emptying 07/25/2012   Microscopic hematuria 07/25/2012   Right upper quadrant pain 07/25/2012    ONSET DATE: 1995  REFERRING DIAG: MS  THERAPY DIAG:  Muscle weakness (generalized)  Multiple sclerosis exacerbation (HCC)  Difficulty in walking, not elsewhere classified  Other abnormalities of gait and mobility  Rationale for Evaluation and Treatment: Rehabilitation  SUBJECTIVE:                                                                                                                                                                                             SUBJECTIVE STATEMENT: Patient reports its rainy but is feeling ready to work. Had an indoor cookout due to weather for the holiday.   Pt accompanied by: self  PERTINENT HISTORY:  Patient is returning to PT s/p hospitalization.  s/p ORIF of R tibia shaft fracture 09/23/2022. Patient has weakness in BLE with RLE>LLE. She drives with hand controls. Patient has been diagnosed with MS in 1995. PMH includes: back pain, CBP, chronic L shoulder pain, galactorrhea, neuropathy, HPV, hypercholestermeia, HTN, MS, osteopenia, PONV, wrist fracture. Additional order for other closed fracture of proximal end of R tibia with routine healing. Still has to use [employer].   PAIN:  Are you having pain? Occasional pain in RLE; primarily in knee  PRECAUTIONS: Fall  RED FLAGS: None   WEIGHT BEARING RESTRICTIONS: No  FALLS: Has patient fallen in last 6 months? No  LIVING ENVIRONMENT: Lives with: lives with their family Lives in: House/apartment Stairs: ramp Has following equipment at home: Otho Blitz - 2 wheeled, Wheelchair (manual), shower chair, and Grab bars  PLOF: Independent with household  mobility with device  PATIENT GOALS: to get her independence back. To be able to get into car, toilet, and get dressed independently  OBJECTIVE:  Note: Objective measures were completed at Evaluation unless otherwise noted.  DIAGNOSTIC FINDINGS: MRI of the brain 03/18/2020 showed multiple T2/FLAIR hyperintense foci in the periventricular, juxtacortical and deep white matter.  There were no infratentorial lesions noted.  None of the foci enhanced.   Due to severe claustrophobia, the study was done with conscious sedation in the hospital   COGNITION: Overall cognitive status: Within functional limits for tasks assessed   SENSATION: Lack of sensation in feet, loss in bilateral lateral aspect of knee  COORDINATION: Does not have the strength for functional LE heel slide test     MUSCLE TONE: BLE mild tone    POSTURE: rounded shoulders, forward head, anterior pelvic tilt, and weight shift left    LOWER EXTREMITY MMT:    MMT Right Eval Left Eval  Hip flexion 0.9 1.5  Hip extension    Hip abduction 1.8 2.6  Hip adduction 3.1 1.9  Hip internal rotation    Hip external rotation    Knee flexion 0.9 1.5  Knee extension 0.8 1.2  Ankle dorsiflexion    Ankle plantarflexion    Ankle inversion    Ankle eversion    (Blank rows = not tested)  BED MOBILITY:  Assess in future session due to limited time  TRANSFERS: Assistive device utilized: Bariatric RW with sit to stands, slide board to table  Sit to stand: unable to reach full stand Stand to sit: unable to reach full stand  Chair to chair: slide board with CGA     GAIT: Unable to ambulate at this time.   FUNCTIONAL TESTS:  Sit to stand: tricep press with BUE; unable to bring arm to walker.    Function In Sitting Test (FIST)  (1/2 femur on surface; hips/knees flexed to 90deg)   - indicate bed or mat table / step stool if used  SCORING KEY: 4 = Independent (completes task independently & successfully) 3 = Verbal  Cues/Increased Time (completes task independently & successfully and only needs more time/cues) 2 = Upper Extremity Support (must use UE for support or assistance to complete successfully) 1 = Needs Assistance (unable to complete w/o physical assist; DOCUMENT LEVEL: min, mod, max) 0 = Dependent (requires complete physical assist; unable to complete successfully even w/ physical assist)  Randomly Administer Once Throughout Exam  4 - Anterior Nudge (superior sternum)  4 - Posterior Nudge (between scapular spines)  4 - Lateral Nudge (to dominant side at acromion)     4 - Static sitting (30 seconds)  4 - Sitting, shake 'no' (left and right)  4 - Sitting, eyes closed (30 seconds)   0 - Sitting, lift foot (dominant side, lift foot 1 inch twice)    2 - Pick up object from behind (object at midline, hands breadth posterior)  3 - Forward reach (use dominant arm, must complete full motion) 2 - Lateral reach (use dominant arm, clear opposite ischial tuberosity) 2 - Pick up object from floor (from between feet)   2 - Posterior scooting (move backwards 2 inches)  2 - Anterior scooting (move forward 2 inches)  2 - Lateral scooting (move to dominant side 2 inches)    TOTAL = 39/56  Notes/comments: slide board tranfer to/from table   MCD > 5 points MCID for IP REHAB > 6 points  Function In Sitting Test (FIST) 08/14/23 (1/2 femur on surface; hips/knees flexed to 90deg)   - indicate bed or mat table / step stool if used  SCORING KEY: 4 =  Independent (completes task independently & successfully) 3 = Verbal Cues/Increased Time (completes task independently & successfully and only needs more time/cues) 2 = Upper Extremity Support (must use UE for support or assistance to complete successfully) 1 = Needs Assistance (unable to complete w/o physical assist; DOCUMENT LEVEL: min, mod, max) 0 = Dependent (requires complete physical assist; unable to complete successfully even w/ physical  assist)  Randomly Administer Once Throughout Exam  4 - Anterior Nudge (superior sternum)  4 - Posterior Nudge (between scapular spines)  4 - Lateral Nudge (to dominant side at acromion)     4 - Static sitting (30 seconds)  4 - Sitting, shake 'no' (left and right)  4 - Sitting, eyes closed (30 seconds)   0 - Sitting, lift foot (dominant side, lift foot 1 inch twice)    4 - Pick up object from behind (object at midline, hands breadth posterior)  4 - Forward reach (use dominant arm, must complete full motion) 4 - Lateral reach (use dominant arm, clear opposite ischial tuberosity) 2 - Pick up object from floor (from between feet)   3 - Posterior scooting (move backwards 2 inches)  3 - Anterior scooting (move forward 2 inches)  3 - Lateral scooting (move to dominant side 2 inches)    TOTAL = 47/56    MCD > 5 points MCID for IP REHAB > 6 points                                                                                                                              TREATMENT DATE: 10/02/23   TherAct: In // bars with UE support:  -sit to stand with cue forward weight shift and hand placement. Cues for LE recruitment;  -upon standing walk full length of // bars: 2 and half lengths completed total with rest breaks between each length. ; wheelchair follow with tactile cueing for weight shift onto RLE for LLE advancement - 5 partial stands with focus on LE recruitment and forward momentum; 2 sets  Therex Large swiss ball: -forward taps/backwards taps 10x -diagonal taps R/L forward/backward for optimal reach 10x each side -isometric press into swiss ball 10x each Le     PATIENT EDUCATION: Education details: goals, POC, HEP  Person educated: Patient Education method: Explanation, Demonstration, Tactile cues, Verbal cues, and Handouts Education comprehension: verbalized understanding, returned demonstration, verbal cues required, tactile cues required, and needs further  education  HOME EXERCISE PROGRAM: Stand 3x/day in stander   GOALS: Goals reviewed with patient? Yes  SHORT TERM GOALS: Target date: 08/08/2023    Patient will be independent in home exercise program to improve strength/mobility for better functional independence with ADLs.  Baseline: 4/8: compliance Goal status: MET    LONG TERM GOALS: Target date: 10/04/2023    Patient will tolerate five consecutive stands with UE support from wheelchair to standing to improve functional mobility.  Baseline: unable to perform 4/8:unable to perform full stand 5/20: perform  next session  Goal status: Ongoing   2.  Patient will ambulate 10 ft with bRW with wheelchair follow.  Baseline:  unable to ambulate 4/8: unable to ambulate 5/20: able to ambulate length of // bars in previous session Goal status: partially met  3.  Patient will improve FIST score >6 points to demonstrate improved stability and ability to perform ADLs.  Baseline:  3/6: 39/56 4/8: 47/ 56 5/20: 48/56 Goal status: MET  4.  Patient will perform toileting with mod I at home for improved independence.  Baseline: 3/6: requires assistance 4/8: requires dependence on machine 5/20: requires assistance with use of wipe buddy.  Goal status: Partially Met    ASSESSMENT:  CLINICAL IMPRESSION: Patient is able to ambulate longest duration to date of two and a half lengths of // bars with assistance for weight shift onto RLE. She is aware next session is recert day and eager to progress her mobility. She is fatigued but motivated by end of session.   Patient will benefit from skilled physical therapy to improve mobility, stability, and independence.   OBJECTIVE IMPAIRMENTS: Abnormal gait, cardiopulmonary status limiting activity, decreased activity tolerance, decreased balance, decreased coordination, decreased endurance, decreased mobility, difficulty walking, decreased ROM, decreased strength, hypomobility, increased fascial restrictions,  impaired perceived functional ability, impaired flexibility, impaired sensation, improper body mechanics, and postural dysfunction.   ACTIVITY LIMITATIONS: carrying, lifting, bending, sitting, standing, squatting, sleeping, stairs, transfers, bed mobility, continence, bathing, toileting, dressing, self feeding, reach over head, hygiene/grooming, locomotion level, and caring for others  PARTICIPATION LIMITATIONS: meal prep, cleaning, laundry, interpersonal relationship, driving, shopping, community activity, and church  PERSONAL FACTORS: Age, Past/current experiences, Time since onset of injury/illness/exacerbation, Transportation, and 3+ comorbidities: ack pain, CBP, chronic L shoulder pain, galactorrhea, neuropathy, HPV, hypercholestermeia, HTN, MS, osteopenia, PONV, wrist fracture are also affecting patient's functional outcome.   REHAB POTENTIAL: Good  CLINICAL DECISION MAKING: Evolving/moderate complexity  EVALUATION COMPLEXITY: Moderate  PLAN:  PT FREQUENCY: 2x/week  PT DURATION: 12 weeks  PLANNED INTERVENTIONS: 97164- PT Re-evaluation, 97110-Therapeutic exercises, 97530- Therapeutic activity, 97112- Neuromuscular re-education, 97535- Self Care, 53664- Manual therapy, [phone]- Gait training, [phone]- Orthotic Fit/training, [phone]- Canalith repositioning, [id number]- Aquatic Therapy, [phone]- Electrical stimulation (unattended), [phone]- Electrical stimulation (manual), [id number]- Ultrasound, [id number]- Traction (mechanical), Patient/Family education, Balance training, Stair training, Taping, Dry Needling, Joint mobilization, Joint manipulation, Spinal manipulation, Spinal mobilization, Scar mobilization, Compression bandaging, Vestibular training, Visual/preceptual remediation/compensation, Cognitive remediation, DME instructions, Cryotherapy, Moist heat, and Biofeedback  PLAN FOR NEXT SESSION: sit to stands; try supported walking   RECERT  Jaskaran Dauzat  Brain Cahill, PT, DPT Physical Therapist - [facility]  Outpatient Physical Therapy- Main Campus [phone]    10/02/2023, 11:01 AM

## 2023-09-28 ENCOUNTER — Ambulatory Visit
Admission: RE | Admit: 2023-09-28 | Discharge: 2023-09-28 | Disposition: A | Discharge status: Home / Self Care | Source: Ambulatory Visit | Attending: Internal Medicine | Admitting: Internal Medicine

## 2023-09-28 ENCOUNTER — Ambulatory Visit

## 2023-09-28 DIAGNOSIS — R7989 Other specified abnormal findings of blood chemistry: Secondary | ICD-10-CM | POA: Insufficient documentation

## 2023-09-28 DIAGNOSIS — R278 Other lack of coordination: Secondary | ICD-10-CM

## 2023-09-28 DIAGNOSIS — G35 Multiple sclerosis: Secondary | ICD-10-CM

## 2023-09-28 DIAGNOSIS — M6281 Muscle weakness (generalized): Secondary | ICD-10-CM | POA: Diagnosis not present

## 2023-09-28 NOTE — Therapy (Signed)
 OUTPATIENT OCCUPATIONAL THERAPY NEURO PROGRESS AND TREATMENT NOTE  Reporting period beginning 08/16/23-09/28/23  Patient Name: Lisa Williams MRN: [id number] DOB:[birth year], 63 y.o., female Today's Date: 09/28/2023  PCP: Dr. Overton Blotter   Neurologist Dr. Octavio Ben at [facility] REFERRING PROVIDER: Dr. Overton Blotter    END OF SESSION:  OT End of Session - 09/28/23 1057     Visit Number 10    Number of Visits 24    Date for OT Re-Evaluation 11/08/23    Authorization Time Period Reporting period beginning 08/16/23-09/28/23    Progress Note Due on Visit 10    OT Start Time 1020    OT Stop Time 1045    OT Time Calculation (min) 25 min    Equipment Utilized During Treatment manual wc    Activity Tolerance Patient tolerated treatment well    Behavior During Therapy [facility] for tasks assessed/performed            Past Medical History:  Diagnosis Date   Abdominal pain, right upper quadrant    Back pain    Calculus of kidney 12/09/2013   Chronic back pain    unspecified   Chronic left shoulder pain 07/19/2015   Complication of anesthesia    Functional disorder of bladder    other   Galactorrhea 11/26/2014   Chronic    Hereditary and idiopathic neuropathy 08/19/2013   History of kidney stones    HPV test positive    Hypercholesteremia 08/19/2013   Hypertension    Incomplete bladder emptying    Microscopic hematuria    MS (multiple sclerosis) (HCC)    Muscle spasticity 05/21/2014   Nonspecific findings on examination of urine    other   Osteopenia    PONV (postoperative nausea and vomiting)    Status post laparoscopic supracervical hysterectomy 11/26/2014   Tobacco user 11/26/2014   Wrist fracture    Past Surgical History:  Procedure Laterality Date   bilateral tubal ligation  1996   BREAST CYST EXCISION Left 2002   CYST EXCISION Left 05/10/2022   Procedure: CYST REMOVAL;  Surgeon: Eldred Grego, MD;  Location: [facility] ORS;  Service: General;  Laterality:  Left;   FRACTURE SURGERY     KNEE SURGERY     right   LAPAROSCOPIC SUPRACERVICAL HYSTERECTOMY  08/05/2013   ORIF WRIST FRACTURE Left 01/17/2017   Procedure: OPEN REDUCTION INTERNAL FIXATION (ORIF) WRIST FRACTURE;  Surgeon: Jerlyn Moons, MD;  Location: [facility] ORS;  Service: Orthopedics;  Laterality: Left;   RADIOLOGY WITH ANESTHESIA N/A 03/18/2020   Procedure: MRI WITH ANESTHESIA CERVICAL SPINE AND BRAIN  WITH AND WITHOUT CONTRAST;  Surgeon: Radiologist, Medication, MD;  Location: [facility] OR;  Service: Radiology;  Laterality: N/A;   TUBAL LIGATION Bilateral    VAGINAL HYSTERECTOMY  03/2006   Patient Active Problem List   Diagnosis Date Noted   Sinus tachycardia 07/07/2023   Hypokalemia 07/29/2022   Weakness of both lower extremities 07/29/2022   Abscess of left groin 11/15/2021   Abnormal LFTs 11/15/2021   Acute respiratory disease due to COVID-19 virus 11/21/2020   Weakness    Hypoalbuminemia due to protein-calorie malnutrition [facility])    Neurogenic bowel    Neurogenic bladder    Labile blood pressure    Neuropathic pain    Abscess of female pelvis    SVT (supraventricular tachycardia) (HCC)    Radial styloid tenosynovitis 03/12/2018   Wheelchair dependence 02/27/2018   Localized osteoporosis with current pathological fracture with routine healing 01/19/2017   Wrist fracture 01/16/2017  Sprain of ankle 03/23/2016   Closed fracture of lateral malleolus 03/16/2016   Health care maintenance 01/24/2016   Blood pressure elevated without history of HTN 10/25/2015   Essential hypertension 10/25/2015   Multiple sclerosis (HCC) 10/02/2015   Chronic left shoulder pain 07/19/2015   Multiple sclerosis exacerbation (HCC) 07/14/2015   MS (multiple sclerosis) (HCC) 11/26/2014   Increased body mass index 11/26/2014   HPV test positive 11/26/2014   Status post laparoscopic supracervical hysterectomy 11/26/2014   Galactorrhea 11/26/2014   Back ache 05/21/2014   Adiposity 05/21/2014   Disordered  sleep 05/21/2014   Muscle spasticity 05/21/2014   Spasticity 05/21/2014   Calculus of kidney 12/09/2013   Renal colic 12/09/2013   Hypercholesteremia 08/19/2013   Hereditary and idiopathic neuropathy 08/19/2013   Hypercholesterolemia without hypertriglyceridemia 08/19/2013   Bladder infection, chronic 07/25/2012   Disorder of bladder function 07/25/2012   Incomplete bladder emptying 07/25/2012   Microscopic hematuria 07/25/2012   Right upper quadrant pain 07/25/2012   ONSET DATE: 5/24 [name] and broke R leg which caused beginning of ADL decline; diagnosed with MS in 1995)  REFERRING DIAG: MS  THERAPY DIAG:  Muscle weakness (generalized)  Other lack of coordination  Multiple sclerosis exacerbation (HCC)  Rationale for Evaluation and Treatment: Rehabilitation  SUBJECTIVE:  SUBJECTIVE STATEMENT: Pt verbalized needing to leave OT early today to be on time for her US  upstairs.  Pt reports US  today is to check her liver. Pt accompanied by: self  PERTINENT HISTORY: Pt reports increased difficulty with basic self care tasks since breaking her R leg last May.  Since that time, mobility and ADLs have declined, with pt requiring assist from private caregivers and family to manage bathing, toileting, dressing, and functional transfers.  PRECAUTIONS: Fall  WEIGHT BEARING RESTRICTIONS: No  PAIN: 09/28/23: No pain reported today Are you having pain? No; occasional pain in R lower back, but not today  FALLS: Has patient fallen in last 6 months? No Last fall was May 17 of 2024  LIVING ENVIRONMENT: Lives with: lives with their family (including mother who has dementia and sister [name])  Lives in: 2 level home but pt resides on main level Stairs: ramp  Has following equipment at home: Wheelchair (manual), Shower bench, and hand held shower shower, 1 grab bar in the shower, 3in1 commode, sit to stand lift (electric), FWW, hospital bed  PLOF: modified indep with ADL/IADLs prior to May of  2024   PATIENT GOALS: Increase independence with basic self care tasks  OBJECTIVE:  Note: Objective measures were completed at Evaluation unless otherwise noted.  HAND DOMINANCE: Right  ADLs:  Overall ADLs: assist provided from paid caregiver and sister Transfers/ambulation related to ADLs: sit to stand lift for all transfers, set up for sliding board in/out of bed  Eating: indep  Grooming: modified indep (wc level in mom's bathroom)  UB Dressing: set up (can't access her bedroom closet)  LB Dressing: able to sit on side of bed to don all LB clothing; assist to hike pants/underwear Toileting: assist with clothing management d/t standing within electric lift Bathing: bed bath with sister helping to wash backside Tub Shower transfers: N/A; pt has walk in shower in mom's bathroom (wc does fit in this bathroom but pt reports inability to transfer from wc and requires arm rests on a bench for a successful transfer attempt from any DME).  Tub bench is in pt's bathroom, but lift does not fit through bathroom doorway and pt reports inability to transfer up from  tub bench (currently unable to manage either transfer)   Equipment: see above  IADLs: Shopping: [employer] for shopping Light housekeeping: modified indep from wc level Meal Prep: modified indep from wc level Community mobility: relies on community transportation or family members Medication management: indep Landscape architect: indep Handwriting: NT; pt denies any FMC challenges  MOBILITY STATUS: Hx of falls  POSTURE COMMENTS:  Rounded shoulders, forward head, anterior pelvic tilt, and weight shift left   ACTIVITY TOLERANCE: Activity tolerance: Per PT; currently standing 30-60 sec within Light Gait harness/lift; currently non-ambulatory  UPPER EXTREMITY ROM:  BUEs WFL  UPPER EXTREMITY MMT:     MMT Right eval Left eval  Shoulder flexion 4+ 4  Shoulder abduction 4+ 4  Shoulder adduction    Shoulder extension     Shoulder internal rotation 4+ 4+  Shoulder external rotation 4 4  Middle trapezius    Lower trapezius    Elbow flexion 4+ 4+  Elbow extension 4+ 4+  Wrist flexion 4+ 4+  Wrist extension 4+ 4+  Wrist ulnar deviation    Wrist radial deviation    Wrist pronation    Wrist supination    (Blank rows = not tested)  HAND FUNCTION/COORDINATION:  R/L WNL; pt denies any coordination deficits/ able to manipulate pills/clothing fasteners/etc without difficulty  SENSATION: WFL  EDEMA: No visible edema in BUEs  MUSCLE TONE: BUEs WNL  COGNITION: Overall cognitive status: Within functional limits for tasks assessed  VISION: wears glasses all the time, no reports of diplopia  PERCEPTION: Not tested  PRAXIS: WFL  OBSERVATIONS:  Pt pleasant, cooperative, and motivated to work towards improving indep with ADLs.                                                                                                     TREATMENT DATE: 09/28/23 Therapeutic Activity: -Core stability: promoted forward and lateral (R/L) reaching with return to midline without UE support.  Pt reached to place Connect 4 pieces into game board without wc arm rests in place when reaching to either side; OT provided close SBA to reduce fall risk. -Anterior WS trials in prep for scoot pivot transfers to commode, working to clear buttocks from wc seat; visual target placed in front of pt to promote greater anterior lean and min vc and tactile cues for engaging quads rather than relying solely on BUEs to lift x5 reps each -Modified functional reach (seated) assessed:  Able to reach laterally to the R and L without UE support 8" outside BOS to either side, feet supported (increased fall risk)  Able to reach forward without UE support 14" outside BOS, feet supported [facility])  PATIENT EDUCATION: Education details: Core stability exercises Person educated: Patient Education method: Explanation, demo, vc Education comprehension:  verbalized understanding, further training needed  HOME EXERCISE PROGRAM: IR A/AAROM to promote shoulder flexibility for peri care/bathing  GOALS: Goals reviewed with patient? Yes  SHORT TERM GOALS: Target date: 09/27/23  Pt will perform bed bath with set up.  Baseline: Eval: Min-mod A for posterior washing; 09/28/23: Min A for posterior washing  Goal status: in progress  2.  Pt will utilize sliding board for wc<>drop arm commode transfer with SBA. Baseline: Eval: Currently using electric lift for transfer to [facility]; 09/28/23: Not yet completed at home but transfers to commode have been easier without sliding board d/t pt implementing a scoot pivot. Goal status: d/c (pt does not prefer sliding board for commode transfer)  3.  Pt will be indep to perform HEP for maintaining BUE strength for ADLs and functional transfers. Baseline: Eval: HEP not yet initiated; 09/28/23: Pt is working on core stability exercises from wc, cane stretches for bilat shoulder IR, and tricep extension via wc pushups Goal status: indep  LONG TERM GOALS: Target date: 11/08/23  Pt will perform sink bath with min A. Baseline: Eval: Currently bed bathing d/t inability to stand at sink without electric lift (lift does not fit into pt's bathroom); 09/28/23: still completing bed bath as pt is not yet able to stand  Goal status: ongoing  2.  Pt will perform wc<>tub bench transfer with min A. Baseline: Eval: Unable; 09/28/23: Performed in OT clinic with min A, but not yet in the home Goal status: ongoing  3.  Pt will perform squat pivot transfer wc<>BSC with SBA. Baseline: Eval: Eval: Currently using electric lift for transfer to [facility]; 09/28/23: Not yet completed in clinic with [facility], and using lift for BSC in the home still Goal status: ongoing  4. Pt will perform scoot transfer wc<>toilet using grab bars with supv to allow toileting in community setting.  Baseline: Eval: Pt limits community outings d/t requiring use of lift for  [facility] transfers; 09/28/23: Pt has performed 2 successful scoot pivot  Transfers to toilet in OT clinic with min A, extra time.  Further trials with clothing management on toilet needed, but pt can lower and hike  pants while sititng edge of mat via lateral leaning and min A to maintain core stability.  Goal status: ongoing  5.  Pt will tolerate standing x1 min with close supv and BUE support to manage clothing in prep for toileting. Baseline: Eval: Currently standing in electric lift only, or within parallel bars without lift; 09/28/23: Not yet attempted Goal status: in progress  ASSESSMENT: CLINICAL IMPRESSION: Shortened tx session this date d/t pt having to leave early for a medical appt.  Pt seen for 10th visit progress update.  Pt making steady gains with scoot pivot transfers from wc to tub bench and wc to elevated toilet seat without use of sliding board, per pt's preference.  Pt has successfully completed tub transfer in clinic x1 and toilet transfer in clinic x2, but not yet attempted at home.  Core strengthening and seated lifts in wc in prep for squat pivot transfers have been a focus over the last several sessions for increasing control during transfer to/from commode or tub bench and for increasing stability while seated on commode during clothing management.  Pt presents with weak core, specifically more limited in lateral leaning to either side (see above for modified functional reach), which impacts ability to lower and hike clothing while seated on commode as pt uses a lateral weight shift d/t not yet able to stand.  Pt will continue to benefit from skilled OT to work towards above noted goals in OT poc, working to maximize indep with daily tasks while reducing burden of care on caregivers.   PERFORMANCE DEFICITS: in functional skills including ADLs, IADLs, strength, pain, flexibility, Gross motor control, mobility, balance, body mechanics, endurance, and decreased knowledge  of use of DME,  and psychosocial skills including coping strategies, environmental adaptation, habits, and routines and behaviors.   IMPAIRMENTS: are limiting patient from ADLs, IADLs, and social participation.   CO-MORBIDITIES: has co-morbidities such as neuropathy, back pain, obesity, MS, HTN that affects occupational performance. Patient will benefit from skilled OT to address above impairments and improve overall function.  MODIFICATION OR ASSISTANCE TO COMPLETE EVALUATION: No modification of tasks or assist necessary to complete an evaluation.  OT OCCUPATIONAL PROFILE AND HISTORY: Detailed assessment: Review of records and additional review of physical, cognitive, psychosocial history related to current functional performance.  CLINICAL DECISION MAKING: Moderate - several treatment options, min-mod task modification necessary  REHAB POTENTIAL: Good  EVALUATION COMPLEXITY: Moderate    PLAN:  OT FREQUENCY: 2x/week  OT DURATION: 12 weeks  PLANNED INTERVENTIONS: 97168 OT Re-evaluation, 97535 self care/ADL training, 16109 therapeutic exercise, 97530 therapeutic activity, 97112 neuromuscular re-education, 97140 manual therapy, 97116 gait training, 60454 moist heat, 97010 cryotherapy, balance training, functional mobility training, psychosocial skills training, energy conservation, coping strategies training, patient/family education, and DME and/or AE instructions  RECOMMENDED OTHER SERVICES: None at this time (Pt currently receiving PT services in this clinic)  CONSULTED AND AGREED WITH PLAN OF CARE: Patient  PLAN FOR NEXT SESSION: see above  Marcus Sewer, MS, OTR/L  Casandra Claw, OT 09/28/2023, 11:00 AM

## 2023-10-02 ENCOUNTER — Other Ambulatory Visit: Payer: Self-pay | Admitting: *Deleted

## 2023-10-02 ENCOUNTER — Ambulatory Visit: Payer: PPO

## 2023-10-02 DIAGNOSIS — R262 Difficulty in walking, not elsewhere classified: Secondary | ICD-10-CM

## 2023-10-02 DIAGNOSIS — M6281 Muscle weakness (generalized): Secondary | ICD-10-CM

## 2023-10-02 DIAGNOSIS — R2689 Other abnormalities of gait and mobility: Secondary | ICD-10-CM

## 2023-10-02 DIAGNOSIS — G35 Multiple sclerosis: Secondary | ICD-10-CM

## 2023-10-02 DIAGNOSIS — R269 Unspecified abnormalities of gait and mobility: Secondary | ICD-10-CM

## 2023-10-02 DIAGNOSIS — R29898 Other symptoms and signs involving the musculoskeletal system: Secondary | ICD-10-CM

## 2023-10-02 DIAGNOSIS — R5383 Other fatigue: Secondary | ICD-10-CM

## 2023-10-03 NOTE — Therapy (Signed)
 OUTPATIENT PHYSICAL THERAPY NEURO TREATMENT / RECERT ***       Patient Name: Lisa Williams MRN: [id number] DOB:[birth year], 63 y.o., female Today's Date: 10/03/2023   PCP: Overton Blotter  REFERRING PROVIDER: Overton Blotter  END OF SESSION:                       Past Medical History:  Diagnosis Date   Abdominal pain, right upper quadrant    Back pain    Calculus of kidney 12/09/2013   Chronic back pain    unspecified   Chronic left shoulder pain 07/19/2015   Complication of anesthesia    Functional disorder of bladder    other   Galactorrhea 11/26/2014   Chronic    Hereditary and idiopathic neuropathy 08/19/2013   History of kidney stones    HPV test positive    Hypercholesteremia 08/19/2013   Hypertension    Incomplete bladder emptying    Microscopic hematuria    MS (multiple sclerosis) (HCC)    Muscle spasticity 05/21/2014   Nonspecific findings on examination of urine    other   Osteopenia    PONV (postoperative nausea and vomiting)    Status post laparoscopic supracervical hysterectomy 11/26/2014   Tobacco user 11/26/2014   Wrist fracture    Past Surgical History:  Procedure Laterality Date   bilateral tubal ligation  1996   BREAST CYST EXCISION Left 2002   CYST EXCISION Left 05/10/2022   Procedure: CYST REMOVAL;  Surgeon: Eldred Grego, MD;  Location: [facility] OR[facility];  Service: General;  Laterality: Left;   FRACTURE SURGERY     KNEE SURGERY     right   LAPAROSCOPIC SUPRACERVICAL HYSTERECTOMY  08/05/2013   ORIF WRIST FRACTURE Left 01/17/2017   Procedure: OPEN REDUCTION INTERNAL FIXATION (ORIF) WRIST FRACTURE;  Surgeon: Jerlyn Moons, MD;  Location: [facility];  Service: Orthopedics;  Laterality: Left;   RADIOLOGY WITH ANESTHESIA N/A 03/18/2020   Procedure: MRI WITH ANESTHESIA CERVICAL SPINE AND BRAIN  WITH AND WITHOUT CONTRAST;  Surgeon: Radiologist, Medication, MD;  Location: [facility] OR;  Service: Radiology;  Laterality:  N/A;   TUBAL LIGATION Bilateral    VAGINAL HYSTERECTOMY  03/2006   Patient Active Problem List   Diagnosis Date Noted   Sinus tachycardia 07/07/2023   Hypokalemia 07/29/2022   Weakness of both lower extremities 07/29/2022   Abscess of left groin 11/15/2021   Abnormal LFTs 11/15/2021   Acute respiratory disease due to COVID-19 virus 11/21/2020   Weakness    Hypoalbuminemia due to protein-calorie malnutrition [facility])    Neurogenic bowel    Neurogenic bladder    Labile blood pressure    Neuropathic pain    Abscess of female pelvis    SVT (supraventricular tachycardia) (HCC)    Radial styloid tenosynovitis 03/12/2018   Wheelchair dependence 02/27/2018   Localized osteoporosis with current pathological fracture with routine healing 01/19/2017   Wrist fracture 01/16/2017   Sprain of ankle 03/23/2016   Closed fracture of lateral malleolus 03/16/2016   Health care maintenance 01/24/2016   Blood pressure elevated without history of HTN 10/25/2015   Essential hypertension 10/25/2015   Multiple sclerosis (HCC) 10/02/2015   Chronic left shoulder pain 07/19/2015   Multiple sclerosis exacerbation (HCC) 07/14/2015   MS (multiple sclerosis) (HCC) 11/26/2014   Increased body mass index 11/26/2014   HPV test positive 11/26/2014   Status post laparoscopic supracervical hysterectomy 11/26/2014   Galactorrhea 11/26/2014   Back ache 05/21/2014   Adiposity 05/21/2014  Disordered sleep 05/21/2014   Muscle spasticity 05/21/2014   Spasticity 05/21/2014   Calculus of kidney 12/09/2013   Renal colic 12/09/2013   Hypercholesteremia 08/19/2013   Hereditary and idiopathic neuropathy 08/19/2013   Hypercholesterolemia without hypertriglyceridemia 08/19/2013   Bladder infection, chronic 07/25/2012   Disorder of bladder function 07/25/2012   Incomplete bladder emptying 07/25/2012   Microscopic hematuria 07/25/2012   Right upper quadrant pain 07/25/2012    ONSET DATE: 1995  REFERRING DIAG:  MS  THERAPY DIAG:  No diagnosis found.  Rationale for Evaluation and Treatment: Rehabilitation  SUBJECTIVE:                                                                                                                                                                                             SUBJECTIVE STATEMENT: ***.   Pt accompanied by: self  PERTINENT HISTORY:  Patient is returning to PT s/p hospitalization.  s/p ORIF of R tibia shaft fracture 09/23/2022. Patient has weakness in BLE with RLE>LLE. She drives with hand controls. Patient has been diagnosed with MS in 1995. PMH includes: back pain, CBP, chronic L shoulder pain, galactorrhea, neuropathy, HPV, hypercholestermeia, HTN, MS, osteopenia, PONV, wrist fracture. Additional order for other closed fracture of proximal end of R tibia with routine healing. Still has to use [employer].   PAIN:  Are you having pain? Occasional pain in RLE; primarily in knee  PRECAUTIONS: Fall  RED FLAGS: None   WEIGHT BEARING RESTRICTIONS: No  FALLS: Has patient fallen in last 6 months? No  LIVING ENVIRONMENT: Lives with: lives with their family Lives in: House/apartment Stairs: ramp Has following equipment at home: Otho Blitz - 2 wheeled, Wheelchair (manual), shower chair, and Grab bars  PLOF: Independent with household mobility with device  PATIENT GOALS: to get her independence back. To be able to get into car, toilet, and get dressed independently  OBJECTIVE:  Note: Objective measures were completed at Evaluation unless otherwise noted.  DIAGNOSTIC FINDINGS: MRI of the brain 03/18/2020 showed multiple T2/FLAIR hyperintense foci in the periventricular, juxtacortical and deep white matter.  There were no infratentorial lesions noted.  None of the foci enhanced.   Due to severe claustrophobia, the study was done with conscious sedation in the hospital   COGNITION: Overall cognitive status: Within functional limits for tasks  assessed   SENSATION: Lack of sensation in feet, loss in bilateral lateral aspect of knee  COORDINATION: Does not have the strength for functional LE heel slide test     MUSCLE TONE: BLE mild tone    POSTURE: rounded shoulders, forward head, anterior pelvic tilt, and weight shift left  LOWER EXTREMITY MMT:    MMT Right Eval Left Eval  Hip flexion 0.9 1.5  Hip extension    Hip abduction 1.8 2.6  Hip adduction 3.1 1.9  Hip internal rotation    Hip external rotation    Knee flexion 0.9 1.5  Knee extension 0.8 1.2  Ankle dorsiflexion    Ankle plantarflexion    Ankle inversion    Ankle eversion    (Blank rows = not tested)  BED MOBILITY:  Assess in future session due to limited time  TRANSFERS: Assistive device utilized: Bariatric RW with sit to stands, slide board to table  Sit to stand: unable to reach full stand Stand to sit: unable to reach full stand  Chair to chair: slide board with CGA     GAIT: Unable to ambulate at this time.   FUNCTIONAL TESTS:  Sit to stand: tricep press with BUE; unable to bring arm to walker.    Function In Sitting Test (FIST)  (1/2 femur on surface; hips/knees flexed to 90deg)   - indicate bed or mat table / step stool if used  SCORING KEY: 4 = Independent (completes task independently & successfully) 3 = Verbal Cues/Increased Time (completes task independently & successfully and only needs more time/cues) 2 = Upper Extremity Support (must use UE for support or assistance to complete successfully) 1 = Needs Assistance (unable to complete w/o physical assist; DOCUMENT LEVEL: min, mod, max) 0 = Dependent (requires complete physical assist; unable to complete successfully even w/ physical assist)  Randomly Administer Once Throughout Exam  4 - Anterior Nudge (superior sternum)  4 - Posterior Nudge (between scapular spines)  4 - Lateral Nudge (to dominant side at acromion)     4 - Static sitting (30 seconds)  4 - Sitting,  shake 'no' (left and right)  4 - Sitting, eyes closed (30 seconds)   0 - Sitting, lift foot (dominant side, lift foot 1 inch twice)    2 - Pick up object from behind (object at midline, hands breadth posterior)  3 - Forward reach (use dominant arm, must complete full motion) 2 - Lateral reach (use dominant arm, clear opposite ischial tuberosity) 2 - Pick up object from floor (from between feet)   2 - Posterior scooting (move backwards 2 inches)  2 - Anterior scooting (move forward 2 inches)  2 - Lateral scooting (move to dominant side 2 inches)    TOTAL = 39/56  Notes/comments: slide board tranfer to/from table   MCD > 5 points MCID for IP REHAB > 6 points  Function In Sitting Test (FIST) 08/14/23 (1/2 femur on surface; hips/knees flexed to 90deg)   - indicate bed or mat table / step stool if used  SCORING KEY: 4 = Independent (completes task independently & successfully) 3 = Verbal Cues/Increased Time (completes task independently & successfully and only needs more time/cues) 2 = Upper Extremity Support (must use UE for support or assistance to complete successfully) 1 = Needs Assistance (unable to complete w/o physical assist; DOCUMENT LEVEL: min, mod, max) 0 = Dependent (requires complete physical assist; unable to complete successfully even w/ physical assist)  Randomly Administer Once Throughout Exam  4 - Anterior Nudge (superior sternum)  4 - Posterior Nudge (between scapular spines)  4 - Lateral Nudge (to dominant side at acromion)     4 - Static sitting (30 seconds)  4 - Sitting, shake 'no' (left and right)  4 - Sitting, eyes closed (30 seconds)   0 -  Sitting, lift foot (dominant side, lift foot 1 inch twice)    4 - Pick up object from behind (object at midline, hands breadth posterior)  4 - Forward reach (use dominant arm, must complete full motion) 4 - Lateral reach (use dominant arm, clear opposite ischial tuberosity) 2 - Pick up object from floor (from between  feet)   3 - Posterior scooting (move backwards 2 inches)  3 - Anterior scooting (move forward 2 inches)  3 - Lateral scooting (move to dominant side 2 inches)    TOTAL = 47/56    MCD > 5 points MCID for IP REHAB > 6 points                                                                                                                              TREATMENT DATE: 10/03/23 Physical therapy treatment session today consisted of completing assessment of goals and administration of testing as demonstrated and documented in flow sheet, treatment, and goals section of this note. Addition treatments may be found below.    TherAct: In // bars with UE support:  -sit to stand with cue forward weight shift and hand placement. Cues for LE recruitment;  -upon standing walk full length of // bars: 2 and half lengths completed total with rest breaks between each length. ; wheelchair follow with tactile cueing for weight shift onto RLE for LLE advancement - 5 partial stands with focus on LE recruitment and forward momentum; 2 sets  Therex Large swiss ball: -forward taps/backwards taps 10x -diagonal taps R/L forward/backward for optimal reach 10x each side -isometric press into swiss ball 10x each Le     PATIENT EDUCATION: Education details: goals, POC, HEP  Person educated: Patient Education method: Explanation, Demonstration, Tactile cues, Verbal cues, and Handouts Education comprehension: verbalized understanding, returned demonstration, verbal cues required, tactile cues required, and needs further education  HOME EXERCISE PROGRAM: Stand 3x/day in stander   GOALS: Goals reviewed with patient? Yes  SHORT TERM GOALS: Target date: 08/08/2023    Patient will be independent in home exercise program to improve strength/mobility for better functional independence with ADLs.  Baseline: 4/8: compliance Goal status: MET    LONG TERM GOALS: Target date: 10/04/2023    Patient will tolerate  five consecutive stands with UE support from wheelchair to standing to improve functional mobility.  Baseline: unable to perform 4/8:unable to perform full stand 5/20: perform next session  Goal status: Ongoing   2.  Patient will ambulate 10 ft with bRW with wheelchair follow.  Baseline:  unable to ambulate 4/8: unable to ambulate 5/20: able to ambulate length of // bars in previous session Goal status: partially met  3.  Patient will improve FIST score >6 points to demonstrate improved stability and ability to perform ADLs.  Baseline:  3/6: 39/56 4/8: 47/ 56 5/20: 48/56 Goal status: MET  4.  Patient will perform toileting with mod I at home for improved independence.  Baseline: 3/6: requires assistance 4/8: requires dependence on machine 5/20: requires assistance with use of wipe buddy.  Goal status: Partially Met    ASSESSMENT:  CLINICAL IMPRESSION: ***   Patient will benefit from skilled physical therapy to improve mobility, stability, and independence.   OBJECTIVE IMPAIRMENTS: Abnormal gait, cardiopulmonary status limiting activity, decreased activity tolerance, decreased balance, decreased coordination, decreased endurance, decreased mobility, difficulty walking, decreased ROM, decreased strength, hypomobility, increased fascial restrictions, impaired perceived functional ability, impaired flexibility, impaired sensation, improper body mechanics, and postural dysfunction.   ACTIVITY LIMITATIONS: carrying, lifting, bending, sitting, standing, squatting, sleeping, stairs, transfers, bed mobility, continence, bathing, toileting, dressing, self feeding, reach over head, hygiene/grooming, locomotion level, and caring for others  PARTICIPATION LIMITATIONS: meal prep, cleaning, laundry, interpersonal relationship, driving, shopping, community activity, and church  PERSONAL FACTORS: Age, Past/current experiences, Time since onset of injury/illness/exacerbation, Transportation, and 3+  comorbidities: ack pain, CBP, chronic L shoulder pain, galactorrhea, neuropathy, HPV, hypercholestermeia, HTN, MS, osteopenia, PONV, wrist fracture are also affecting patient's functional outcome.   REHAB POTENTIAL: Good  CLINICAL DECISION MAKING: Evolving/moderate complexity  EVALUATION COMPLEXITY: Moderate  PLAN:  PT FREQUENCY: 2x/week  PT DURATION: 12 weeks  PLANNED INTERVENTIONS: 97164- PT Re-evaluation, 97110-Therapeutic exercises, 97530- Therapeutic activity, 97112- Neuromuscular re-education, 97535- Self Care, 16109- Manual therapy, [phone]- Gait training, [phone]- Orthotic Fit/training, [phone]- Canalith repositioning, [id number]- Aquatic Therapy, [phone]- Electrical stimulation (unattended), [phone]- Electrical stimulation (manual), [id number]- Ultrasound, 57846- Traction (mechanical), Patient/Family education, Balance training, Stair training, Taping, Dry Needling, Joint mobilization, Joint manipulation, Spinal manipulation, Spinal mobilization, Scar mobilization, Compression bandaging, Vestibular training, Visual/preceptual remediation/compensation, Cognitive remediation, DME instructions, Cryotherapy, Moist heat, and Biofeedback  PLAN FOR NEXT SESSION: sit to stands; try supported walking   RECERT  Finnis Colee  Brain Cahill, PT, DPT Physical Therapist - [facility]  Outpatient Physical Therapy- Main Campus [phone]    10/03/2023, 5:02 PM

## 2023-10-04 ENCOUNTER — Ambulatory Visit: Payer: PPO

## 2023-10-04 ENCOUNTER — Telehealth: Payer: Self-pay | Admitting: *Deleted

## 2023-10-04 DIAGNOSIS — R262 Difficulty in walking, not elsewhere classified: Secondary | ICD-10-CM

## 2023-10-04 DIAGNOSIS — M6281 Muscle weakness (generalized): Secondary | ICD-10-CM | POA: Diagnosis not present

## 2023-10-04 DIAGNOSIS — G35 Multiple sclerosis: Secondary | ICD-10-CM

## 2023-10-04 DIAGNOSIS — R2689 Other abnormalities of gait and mobility: Secondary | ICD-10-CM

## 2023-10-04 NOTE — Telephone Encounter (Signed)
Faxed completed/signed [employer] start form to [employer] at [phone]. Received fax confirmation.

## 2023-10-08 NOTE — Telephone Encounter (Signed)
 Completed PA for [employer] (10 tablets) via [employer]. Sent to [employer]. Key: [id number]. Should have a determination within 3-5 business days.

## 2023-10-08 NOTE — Telephone Encounter (Signed)
 Lisa Williams

## 2023-10-09 ENCOUNTER — Ambulatory Visit: Payer: PPO | Attending: Neurology

## 2023-10-09 ENCOUNTER — Ambulatory Visit

## 2023-10-09 DIAGNOSIS — R2681 Unsteadiness on feet: Secondary | ICD-10-CM | POA: Diagnosis present

## 2023-10-09 DIAGNOSIS — M6281 Muscle weakness (generalized): Secondary | ICD-10-CM | POA: Diagnosis present

## 2023-10-09 DIAGNOSIS — R262 Difficulty in walking, not elsewhere classified: Secondary | ICD-10-CM | POA: Diagnosis present

## 2023-10-09 DIAGNOSIS — R278 Other lack of coordination: Secondary | ICD-10-CM | POA: Diagnosis present

## 2023-10-09 DIAGNOSIS — R2689 Other abnormalities of gait and mobility: Secondary | ICD-10-CM | POA: Insufficient documentation

## 2023-10-09 DIAGNOSIS — R269 Unspecified abnormalities of gait and mobility: Secondary | ICD-10-CM | POA: Insufficient documentation

## 2023-10-09 DIAGNOSIS — G35 Multiple sclerosis: Secondary | ICD-10-CM | POA: Diagnosis present

## 2023-10-09 NOTE — Therapy (Signed)
 OUTPATIENT PHYSICAL THERAPY NEURO TREATMENT       Patient Name: Lisa Williams MRN: [id number] DOB:[birth year], 63 y.o., female Today's Date: 10/09/2023   PCP: Overton Blotter  REFERRING PROVIDER: Overton Blotter  END OF SESSION:  PT End of Session - 10/09/23 1019     Visit Number 24    Number of Visits 47    Date for PT Re-Evaluation 03/20/24    PT Start Time 1016    PT Stop Time 1055    PT Time Calculation (min) 39 min    Equipment Utilized During Treatment Gait belt    Activity Tolerance Patient tolerated treatment well;No increased pain    Behavior During Therapy [facility] for tasks assessed/performed                                 Past Medical History:  Diagnosis Date   Abdominal pain, right upper quadrant    Back pain    Calculus of kidney 12/09/2013   Chronic back pain    unspecified   Chronic left shoulder pain 07/19/2015   Complication of anesthesia    Functional disorder of bladder    other   Galactorrhea 11/26/2014   Chronic    Hereditary and idiopathic neuropathy 08/19/2013   History of kidney stones    HPV test positive    Hypercholesteremia 08/19/2013   Hypertension    Incomplete bladder emptying    Microscopic hematuria    MS (multiple sclerosis) (HCC)    Muscle spasticity 05/21/2014   Nonspecific findings on examination of urine    other   Osteopenia    PONV (postoperative nausea and vomiting)    Status post laparoscopic supracervical hysterectomy 11/26/2014   Tobacco user 11/26/2014   Wrist fracture    Past Surgical History:  Procedure Laterality Date   bilateral tubal ligation  1996   BREAST CYST EXCISION Left 2002   CYST EXCISION Left 05/10/2022   Procedure: CYST REMOVAL;  Surgeon: Eldred Grego, MD;  Location: [facility] ORS;  Service: General;  Laterality: Left;   FRACTURE SURGERY     KNEE SURGERY     right   LAPAROSCOPIC SUPRACERVICAL HYSTERECTOMY  08/05/2013   ORIF WRIST FRACTURE Left 01/17/2017    Procedure: OPEN REDUCTION INTERNAL FIXATION (ORIF) WRIST FRACTURE;  Surgeon: Jerlyn Moons, MD;  Location: [facility] ORS;  Service: Orthopedics;  Laterality: Left;   RADIOLOGY WITH ANESTHESIA N/A 03/18/2020   Procedure: MRI WITH ANESTHESIA CERVICAL SPINE AND BRAIN  WITH AND WITHOUT CONTRAST;  Surgeon: Radiologist, Medication, MD;  Location: [facility] OR;  Service: Radiology;  Laterality: N/A;   TUBAL LIGATION Bilateral    VAGINAL HYSTERECTOMY  03/2006   Patient Active Problem List   Diagnosis Date Noted   Sinus tachycardia 07/07/2023   Hypokalemia 07/29/2022   Weakness of both lower extremities 07/29/2022   Abscess of left groin 11/15/2021   Abnormal LFTs 11/15/2021   Acute respiratory disease due to COVID-19 virus 11/21/2020   Weakness    Hypoalbuminemia due to protein-calorie malnutrition [facility])    Neurogenic bowel    Neurogenic bladder    Labile blood pressure    Neuropathic pain    Abscess of female pelvis    SVT (supraventricular tachycardia) (HCC)    Radial styloid tenosynovitis 03/12/2018   Wheelchair dependence 02/27/2018   Localized osteoporosis with current pathological fracture with routine healing 01/19/2017   Wrist fracture 01/16/2017   Sprain of ankle 03/23/2016  Closed fracture of lateral malleolus 03/16/2016   Health care maintenance 01/24/2016   Blood pressure elevated without history of HTN 10/25/2015   Essential hypertension 10/25/2015   Multiple sclerosis (HCC) 10/02/2015   Chronic left shoulder pain 07/19/2015   Multiple sclerosis exacerbation (HCC) 07/14/2015   MS (multiple sclerosis) (HCC) 11/26/2014   Increased body mass index 11/26/2014   HPV test positive 11/26/2014   Status post laparoscopic supracervical hysterectomy 11/26/2014   Galactorrhea 11/26/2014   Back ache 05/21/2014   Adiposity 05/21/2014   Disordered sleep 05/21/2014   Muscle spasticity 05/21/2014   Spasticity 05/21/2014   Calculus of kidney 12/09/2013   Renal colic 12/09/2013    Hypercholesteremia 08/19/2013   Hereditary and idiopathic neuropathy 08/19/2013   Hypercholesterolemia without hypertriglyceridemia 08/19/2013   Bladder infection, chronic 07/25/2012   Disorder of bladder function 07/25/2012   Incomplete bladder emptying 07/25/2012   Microscopic hematuria 07/25/2012   Right upper quadrant pain 07/25/2012    ONSET DATE: 1995  REFERRING DIAG: MS  THERAPY DIAG:  Muscle weakness (generalized)  Multiple sclerosis exacerbation (HCC)  Other abnormalities of gait and mobility  Difficulty in walking, not elsewhere classified  Other lack of coordination  Unsteadiness on feet  Rationale for Evaluation and Treatment: Rehabilitation  SUBJECTIVE:                                                                                                                                                                                             SUBJECTIVE STATEMENT: Patient reports she is doing well today. No new concerns or complaints.  Pt accompanied by: self  PERTINENT HISTORY:  Patient is returning to PT s/p hospitalization.  s/p ORIF of R tibia shaft fracture 09/23/2022. Patient has weakness in BLE with RLE>LLE. She drives with hand controls. Patient has been diagnosed with MS in 1995. PMH includes: back pain, CBP, chronic L shoulder pain, galactorrhea, neuropathy, HPV, hypercholestermeia, HTN, MS, osteopenia, PONV, wrist fracture. Additional order for other closed fracture of proximal end of R tibia with routine healing. Still has to use [employer].   PAIN:  Are you having pain? Occasional pain in RLE; primarily in knee  PRECAUTIONS: Fall  RED FLAGS: None   WEIGHT BEARING RESTRICTIONS: No  FALLS: Has patient fallen in last 6 months? No  LIVING ENVIRONMENT: Lives with: lives with their family Lives in: House/apartment Stairs: ramp Has following equipment at home: Otho Blitz - 2 wheeled, Wheelchair (manual), shower chair, and Grab bars  PLOF: Independent  with household mobility with device  PATIENT GOALS: to get her independence back. To be able to get into car, toilet, and get dressed independently  OBJECTIVE:  Note: Objective measures were completed at Evaluation unless otherwise noted.  DIAGNOSTIC FINDINGS: MRI of the brain 03/18/2020 showed multiple T2/FLAIR hyperintense foci in the periventricular, juxtacortical and deep white matter.  There were no infratentorial lesions noted.  None of the foci enhanced.   Due to severe claustrophobia, the study was done with conscious sedation in the hospital   COGNITION: Overall cognitive status: Within functional limits for tasks assessed   SENSATION: Lack of sensation in feet, loss in bilateral lateral aspect of knee  COORDINATION: Does not have the strength for functional LE heel slide test     MUSCLE TONE: BLE mild tone    POSTURE: rounded shoulders, forward head, anterior pelvic tilt, and weight shift left    LOWER EXTREMITY MMT:    MMT Right Eval Left Eval  Hip flexion 0.9 1.5  Hip extension    Hip abduction 1.8 2.6  Hip adduction 3.1 1.9  Hip internal rotation    Hip external rotation    Knee flexion 0.9 1.5  Knee extension 0.8 1.2  Ankle dorsiflexion    Ankle plantarflexion    Ankle inversion    Ankle eversion    (Blank rows = not tested)  BED MOBILITY:  Assess in future session due to limited time  TRANSFERS: Assistive device utilized: Bariatric RW with sit to stands, slide board to table  Sit to stand: unable to reach full stand Stand to sit: unable to reach full stand  Chair to chair: slide board with CGA     GAIT: Unable to ambulate at this time.   FUNCTIONAL TESTS:  Sit to stand: tricep press with BUE; unable to bring arm to walker.    Function In Sitting Test (FIST)  (1/2 femur on surface; hips/knees flexed to 90deg)   - indicate bed or mat table / step stool if used  SCORING KEY: 4 = Independent (completes task independently &  successfully) 3 = Verbal Cues/Increased Time (completes task independently & successfully and only needs more time/cues) 2 = Upper Extremity Support (must use UE for support or assistance to complete successfully) 1 = Needs Assistance (unable to complete w/o physical assist; DOCUMENT LEVEL: min, mod, max) 0 = Dependent (requires complete physical assist; unable to complete successfully even w/ physical assist)  Randomly Administer Once Throughout Exam  4 - Anterior Nudge (superior sternum)  4 - Posterior Nudge (between scapular spines)  4 - Lateral Nudge (to dominant side at acromion)     4 - Static sitting (30 seconds)  4 - Sitting, shake 'no' (left and right)  4 - Sitting, eyes closed (30 seconds)   0 - Sitting, lift foot (dominant side, lift foot 1 inch twice)    2 - Pick up object from behind (object at midline, hands breadth posterior)  3 - Forward reach (use dominant arm, must complete full motion) 2 - Lateral reach (use dominant arm, clear opposite ischial tuberosity) 2 - Pick up object from floor (from between feet)   2 - Posterior scooting (move backwards 2 inches)  2 - Anterior scooting (move forward 2 inches)  2 - Lateral scooting (move to dominant side 2 inches)    TOTAL = 39/56  Notes/comments: slide board tranfer to/from table   MCD > 5 points MCID for IP REHAB > 6 points  Function In Sitting Test (FIST) 08/14/23 (1/2 femur on surface; hips/knees flexed to 90deg)   - indicate bed or mat table / step stool if used  SCORING KEY: 4 =  Independent (completes task independently & successfully) 3 = Verbal Cues/Increased Time (completes task independently & successfully and only needs more time/cues) 2 = Upper Extremity Support (must use UE for support or assistance to complete successfully) 1 = Needs Assistance (unable to complete w/o physical assist; DOCUMENT LEVEL: min, mod, max) 0 = Dependent (requires complete physical assist; unable to complete successfully even w/  physical assist)  Randomly Administer Once Throughout Exam  4 - Anterior Nudge (superior sternum)  4 - Posterior Nudge (between scapular spines)  4 - Lateral Nudge (to dominant side at acromion)     4 - Static sitting (30 seconds)  4 - Sitting, shake 'no' (left and right)  4 - Sitting, eyes closed (30 seconds)   0 - Sitting, lift foot (dominant side, lift foot 1 inch twice)    4 - Pick up object from behind (object at midline, hands breadth posterior)  4 - Forward reach (use dominant arm, must complete full motion) 4 - Lateral reach (use dominant arm, clear opposite ischial tuberosity) 2 - Pick up object from floor (from between feet)   3 - Posterior scooting (move backwards 2 inches)  3 - Anterior scooting (move forward 2 inches)  3 - Lateral scooting (move to dominant side 2 inches)    TOTAL = 47/56    MCD > 5 points MCID for IP REHAB > 6 points                                                                                                                              TREATMENT DATE: 10/09/23  There.Act: In // bars with UE support:  sit to stand with cue forward weight shift and hand placement. Cues for LE recruitment;  upon standing walk full length of // bars: 1 length completed due to noted difficulty progressing her LE's today.   Multiple STS in // bars CGA. VC's for anterior weight shifting   2x10 standing in // bars working on alternating SUE support with tapping PT on shoulders. PRN minA with transitioning from RUE and LUE    1x7/side, standing R/L lateral weight shifts CGA  Orange weighted ball: -chest press 10x -overhead press 10x  -lateral shifts for core activation twisting 10x each side  Dynadisc:  Alternating heel to toe presses on dynadisc: 2x10/side  PATIENT EDUCATION: Education details: goals, POC, HEP  Person educated: Patient Education method: Explanation, Demonstration, Tactile cues, Verbal cues, and Handouts Education comprehension:  verbalized understanding, returned demonstration, verbal cues required, tactile cues required, and needs further education  HOME EXERCISE PROGRAM: Stand 3x/day in stander   GOALS: Goals reviewed with patient? Yes  SHORT TERM GOALS: Target date: 08/08/2023    Patient will be independent in home exercise program to improve strength/mobility for better functional independence with ADLs.  Baseline: 4/8: compliance Goal status: MET    LONG TERM GOALS: Target date: 12/27/2023   Patient will tolerate five consecutive stands with UE support from wheelchair  to standing to improve functional mobility.  Baseline: unable to perform 4/8:unable to perform full stand 5/20: perform next session 5/29: two full stands with CGA and cushion on her seat Goal status: Ongoing   2.  Patient will ambulate 10 ft with bRW with wheelchair follow.  Baseline:  unable to ambulate 4/8: unable to ambulate 5/20: able to ambulate length of // bars in previous session 5/27: ambulate 2.5 lengths of // bars with two seated rest breaks with min A for weight shift and wheelchair follow  Goal status: partially met  3.  Patient will improve FIST score >6 points to demonstrate improved stability and ability to perform ADLs.  Baseline:  3/6: 39/56 4/8: 47/ 56 5/20: 48/56 Goal status: MET  4.  Patient will perform toileting with mod I at home for improved independence.  Baseline: 3/6: requires assistance 4/8: requires dependence on machine 5/20: requires assistance with use of wipe buddy.  Goal status: Partially Met    ASSESSMENT:  CLINICAL IMPRESSION:  Continuing PT POC working on standing, STS transfers, and ambulation. Pt with more difficulty today with progressing gait in // bars specifically with LLE in swing phase only able to complete 1 length today and is more effortful than normal. Multiple STS performed throughout session at [facility] and is able to work on LE strength/standing tolerance with weight shifting and SUE  support. Pt fatigues quickly after standing tolerances thus continuing core stability and ankle strength. Pt remains highly motivated throughout session and continues to displays improved strength with STS and standing bouts although gait was limited today. Patient will benefit from skilled physical therapy to improve mobility, stability, and independence.   OBJECTIVE IMPAIRMENTS: Abnormal gait, cardiopulmonary status limiting activity, decreased activity tolerance, decreased balance, decreased coordination, decreased endurance, decreased mobility, difficulty walking, decreased ROM, decreased strength, hypomobility, increased fascial restrictions, impaired perceived functional ability, impaired flexibility, impaired sensation, improper body mechanics, and postural dysfunction.   ACTIVITY LIMITATIONS: carrying, lifting, bending, sitting, standing, squatting, sleeping, stairs, transfers, bed mobility, continence, bathing, toileting, dressing, self feeding, reach over head, hygiene/grooming, locomotion level, and caring for others  PARTICIPATION LIMITATIONS: meal prep, cleaning, laundry, interpersonal relationship, driving, shopping, community activity, and church  PERSONAL FACTORS: Age, Past/current experiences, Time since onset of injury/illness/exacerbation, Transportation, and 3+ comorbidities: ack pain, CBP, chronic L shoulder pain, galactorrhea, neuropathy, HPV, hypercholestermeia, HTN, MS, osteopenia, PONV, wrist fracture are also affecting patient's functional outcome.   REHAB POTENTIAL: Good  CLINICAL DECISION MAKING: Evolving/moderate complexity  EVALUATION COMPLEXITY: Moderate  PLAN:  PT FREQUENCY: 2x/week  PT DURATION: 12 weeks  PLANNED INTERVENTIONS: 97164- PT Re-evaluation, 97110-Therapeutic exercises, 97530- Therapeutic activity, 97112- Neuromuscular re-education, 97535- Self Care, 78295- Manual therapy, [phone]- Gait training, [phone]- Orthotic Fit/training, [phone]- Canalith  repositioning, [id number]- Aquatic Therapy, [phone]- Electrical stimulation (unattended), [phone]- Electrical stimulation (manual), [id number]- Ultrasound, [id number]- Traction (mechanical), Patient/Family education, Balance training, Stair training, Taping, Dry Needling, Joint mobilization, Joint manipulation, Spinal manipulation, Spinal mobilization, Scar mobilization, Compression bandaging, Vestibular training, Visual/preceptual remediation/compensation, Cognitive remediation, DME instructions, Cryotherapy, Moist heat, and Biofeedback  PLAN FOR NEXT SESSION: sit to stands; try supported walking    [employer]. Fairly IV, PT, DPT Physical Therapist- [place]  [facility]  10/09/2023, 1:12 PM

## 2023-10-09 NOTE — Therapy (Unsigned)
 OUTPATIENT OCCUPATIONAL THERAPY NEURO TREATMENT NOTE   Patient Name: Lisa Williams MRN: [id number] DOB:[birth year], 63 y.o., female Today's Date: 10/12/2023  PCP: Dr. Overton Blotter   Neurologist Dr. Octavio Ben at [facility] REFERRING PROVIDER: Dr. Overton Blotter    END OF SESSION:  OT End of Session - 10/12/23 1751     Visit Number 11    Number of Visits 24    Date for OT Re-Evaluation 11/08/23    Authorization Time Period Reporting period beginning 09/28/23    Progress Note Due on Visit 10    OT Start Time 1100    OT Stop Time 1140    OT Time Calculation (min) 40 min    Equipment Utilized During Treatment manual wc    Activity Tolerance Patient tolerated treatment well    Behavior During Therapy Lisa Williams for tasks assessed/performed            Past Medical History:  Diagnosis Date   Abdominal pain, right upper quadrant    Back pain    Calculus of kidney 12/09/2013   Chronic back pain    unspecified   Chronic left shoulder pain 07/19/2015   Complication of anesthesia    Functional disorder of bladder    other   Galactorrhea 11/26/2014   Chronic    Hereditary and idiopathic neuropathy 08/19/2013   History of kidney stones    HPV test positive    Hypercholesteremia 08/19/2013   Hypertension    Incomplete bladder emptying    Microscopic hematuria    MS (multiple sclerosis) (HCC)    Muscle spasticity 05/21/2014   Nonspecific findings on examination of urine    other   Osteopenia    PONV (postoperative nausea and vomiting)    Status post laparoscopic supracervical hysterectomy 11/26/2014   Tobacco user 11/26/2014   Wrist fracture    Past Surgical History:  Procedure Laterality Date   bilateral tubal ligation  1996   BREAST CYST EXCISION Left 2002   CYST EXCISION Left 05/10/2022   Procedure: CYST REMOVAL;  Surgeon: Eldred Grego, MD;  Location: [facility] ORS;  Service: General;  Laterality: Left;   FRACTURE SURGERY     KNEE SURGERY     right    LAPAROSCOPIC SUPRACERVICAL HYSTERECTOMY  08/05/2013   ORIF WRIST FRACTURE Left 01/17/2017   Procedure: OPEN REDUCTION INTERNAL FIXATION (ORIF) WRIST FRACTURE;  Surgeon: Jerlyn Moons, MD;  Location: [facility];  Service: Orthopedics;  Laterality: Left;   RADIOLOGY WITH ANESTHESIA N/A 03/18/2020   Procedure: MRI WITH ANESTHESIA CERVICAL SPINE AND BRAIN  WITH AND WITHOUT CONTRAST;  Surgeon: Radiologist, Medication, MD;  Location: [facility] OR;  Service: Radiology;  Laterality: N/A;   TUBAL LIGATION Bilateral    VAGINAL HYSTERECTOMY  03/2006   Patient Active Problem List   Diagnosis Date Noted   Sinus tachycardia 07/07/2023   Hypokalemia 07/29/2022   Weakness of both lower extremities 07/29/2022   Abscess of left groin 11/15/2021   Abnormal LFTs 11/15/2021   Acute respiratory disease due to COVID-19 virus 11/21/2020   Weakness    Hypoalbuminemia due to protein-calorie malnutrition Lisa Williams)    Neurogenic bowel    Neurogenic bladder    Labile blood pressure    Neuropathic pain    Abscess of female pelvis    SVT (supraventricular tachycardia) (HCC)    Radial styloid tenosynovitis 03/12/2018   Wheelchair dependence 02/27/2018   Localized osteoporosis with current pathological fracture with routine healing 01/19/2017   Wrist fracture 01/16/2017   Sprain of ankle 03/23/2016  Closed fracture of lateral malleolus 03/16/2016   Health care maintenance 01/24/2016   Blood pressure elevated without history of HTN 10/25/2015   Essential hypertension 10/25/2015   Multiple sclerosis (HCC) 10/02/2015   Chronic left shoulder pain 07/19/2015   Multiple sclerosis exacerbation (HCC) 07/14/2015   MS (multiple sclerosis) (HCC) 11/26/2014   Increased body mass index 11/26/2014   HPV test positive 11/26/2014   Status post laparoscopic supracervical hysterectomy 11/26/2014   Galactorrhea 11/26/2014   Back ache 05/21/2014   Adiposity 05/21/2014   Disordered sleep 05/21/2014   Muscle spasticity 05/21/2014    Spasticity 05/21/2014   Calculus of kidney 12/09/2013   Renal colic 12/09/2013   Hypercholesteremia 08/19/2013   Hereditary and idiopathic neuropathy 08/19/2013   Hypercholesterolemia without hypertriglyceridemia 08/19/2013   Bladder infection, chronic 07/25/2012   Disorder of bladder function 07/25/2012   Incomplete bladder emptying 07/25/2012   Microscopic hematuria 07/25/2012   Right upper quadrant pain 07/25/2012   ONSET DATE: 5/24 Lisa Williams and broke R leg which caused beginning of ADL decline; diagnosed with MS in 1995)  REFERRING DIAG: MS  THERAPY DIAG:  Muscle weakness (generalized)  Multiple sclerosis exacerbation (HCC)  Other lack of coordination  Rationale for Evaluation and Treatment: Rehabilitation  SUBJECTIVE:  SUBJECTIVE STATEMENT: Pt reports doing well today. Pt accompanied by: self  PERTINENT HISTORY: Pt reports increased difficulty with basic self care tasks since breaking her R leg last May.  Since that time, mobility and ADLs have declined, with pt requiring assist from private caregivers and family to manage bathing, toileting, dressing, and functional transfers.  PRECAUTIONS: Fall  WEIGHT BEARING RESTRICTIONS: No  PAIN: 10/09/23: No pain reported today Are you having pain? No; occasional pain in R lower back, but not today  FALLS: Has patient fallen in last 6 months? No Last fall was May 17 of 2024  LIVING ENVIRONMENT: Lives with: lives with their family (including mother who has dementia and sister Lisa Williams)  Lives in: 2 level home but pt resides on main level Stairs: ramp  Has following equipment at home: Wheelchair (manual), Shower bench, and hand held shower shower, 1 grab bar in the shower, 3in1 commode, sit to stand lift (electric), FWW, Williams bed  PLOF: modified indep with ADL/IADLs prior to May of 1610   PATIENT GOALS: Increase independence with basic self care tasks  OBJECTIVE:  Note: Objective measures were completed at Evaluation unless  otherwise noted.  HAND DOMINANCE: Right  ADLs:  Overall ADLs: assist provided from paid caregiver and sister Transfers/ambulation related to ADLs: sit to stand lift for all transfers, set up for sliding board in/out of bed  Eating: indep  Grooming: modified indep (wc level in mom's bathroom)  UB Dressing: set up (can't access her bedroom closet)  LB Dressing: able to sit on side of bed to don all LB clothing; assist to hike pants/underwear Toileting: assist with clothing management d/t standing within electric lift Bathing: bed bath with sister helping to wash backside Tub Shower transfers: N/A; pt has walk in shower in mom's bathroom (wc does fit in this bathroom but pt reports inability to transfer from wc and requires arm rests on a bench for a successful transfer attempt from any DME).  Tub bench is in pt's bathroom, but lift does not fit through bathroom doorway and pt reports inability to transfer up from tub bench (currently unable to manage either transfer)   Equipment: see above  IADLs: Shopping: [employer] for shopping Light housekeeping: modified indep from wc level  Meal Prep: modified indep from wc level Community mobility: relies on community transportation or family members Medication management: indep Landscape architect: indep Handwriting: NT; pt denies any [facility] challenges  MOBILITY STATUS: Hx of falls  POSTURE COMMENTS:  Rounded shoulders, forward head, anterior pelvic tilt, and weight shift left   ACTIVITY TOLERANCE: Activity tolerance: Per PT; currently standing 30-60 sec within Light Gait harness/lift; currently non-ambulatory  UPPER EXTREMITY ROM:  BUEs WFL  UPPER EXTREMITY MMT:     MMT Right eval Left eval  Shoulder flexion 4+ 4  Shoulder abduction 4+ 4  Shoulder adduction    Shoulder extension    Shoulder internal rotation 4+ 4+  Shoulder external rotation 4 4  Middle trapezius    Lower trapezius    Elbow flexion 4+ 4+  Elbow extension 4+ 4+   Wrist flexion 4+ 4+  Wrist extension 4+ 4+  Wrist ulnar deviation    Wrist radial deviation    Wrist pronation    Wrist supination    (Blank rows = not tested)  HAND FUNCTION/COORDINATION:  R/L WNL; pt denies any coordination deficits/ able to manipulate pills/clothing fasteners/etc without difficulty  SENSATION: WFL  EDEMA: No visible edema in BUEs  MUSCLE TONE: BUEs WNL  COGNITION: Overall cognitive status: Within functional limits for tasks assessed  VISION: wears glasses all the time, no reports of diplopia  PERCEPTION: Not tested  PRAXIS: WFL  OBSERVATIONS:  Pt pleasant, cooperative, and motivated to work towards improving indep with ADLs.                                                                                                     TREATMENT DATE: 10/09/23 Therapeutic Activity: -attempted sit to stand trials at kitchen sink; 3 attempts to achieve <50% of full standing position.  Pt reports increased fatigue today. -Static standing at parallel bars: 15 secs with BUE support and OT blocking bilat knees.  Multiple trials attempted to achieve full standing position.  Self Care: -Simulated hiking/lowering waist of pants with looped gait belt with pt requiring min A for hiking and lowering to clear buttocks to waist.    PATIENT EDUCATION: Education details: Estate manager/land agent for successful STS Person educated: Patient Education method: Explanation, demo, vc Education comprehension: verbalized understanding, further training needed  HOME EXERCISE PROGRAM: IR A/AAROM to promote shoulder flexibility for peri care/bathing  GOALS: Goals reviewed with patient? Yes  SHORT TERM GOALS: Target date: 09/27/23  Pt will perform bed bath with set up.  Baseline: Eval: Min-mod A for posterior washing; 09/28/23: Min A for posterior washing  Goal status: in progress  2.  Pt will utilize sliding board for wc<>drop arm commode transfer with SBA. Baseline: Eval: Currently using  electric lift for transfer to [facility]; 09/28/23: Not yet completed at home but transfers to commode have been easier without sliding board d/t pt implementing a scoot pivot. Goal status: d/c (pt does not prefer sliding board for commode transfer)  3.  Pt will be indep to perform HEP for maintaining BUE strength for ADLs and functional transfers. Baseline: Eval: HEP not yet  initiated; 09/28/23: Pt is working on core stability exercises from wc, cane stretches for bilat shoulder IR, and tricep extension via wc pushups Goal status: indep  LONG TERM GOALS: Target date: 11/08/23  Pt will perform sink bath with min A. Baseline: Eval: Currently bed bathing d/t inability to stand at sink without electric lift (lift does not fit into pt's bathroom); 09/28/23: still completing bed bath as pt is not yet able to stand  Goal status: ongoing  2.  Pt will perform wc<>tub bench transfer with min A. Baseline: Eval: Unable; 09/28/23: Performed in OT clinic with min A, but not yet in the home Goal status: ongoing  3.  Pt will perform squat pivot transfer wc<>BSC with SBA. Baseline: Eval: Eval: Currently using electric lift for transfer to [facility]; 09/28/23: Not yet completed in clinic with [facility], and using lift for BSC in the home still Goal status: ongoing  4. Pt will perform scoot transfer wc<>toilet using grab bars with supv to allow toileting in community setting.  Baseline: Eval: Pt limits community outings d/t requiring use of lift for [facility] transfers; 09/28/23: Pt has performed 2 successful scoot pivot  Transfers to toilet in OT clinic with min A, extra time.  Further trials with clothing management on toilet needed, but pt can lower and hike  pants while sititng edge of mat via lateral leaning and min A to maintain core stability.  Goal status: ongoing  5.  Pt will tolerate standing x1 min with close supv and BUE support to manage clothing in prep for toileting. Baseline: Eval: Currently standing in electric lift only,  or within parallel bars without lift; 09/28/23: Not yet attempted Goal status: in progress  ASSESSMENT: CLINICAL IMPRESSION: Attempted multiple STS trials at kitchen sink with pt achieving <50% of full standing position.  Able to achieve full static standing position with BUE support on parallel bars for 15 sec with OT blocking bilat knees.  Min A for hiking/lower looped gait belt over buttocks in sitting to simulate hiking/lowering waist of pants from wc level in prep for increasing indep with toileting tasks.  Pt will continue to benefit from skilled OT to work towards above noted goals in OT poc, working to maximize indep with daily tasks while reducing burden of care on caregivers.   PERFORMANCE DEFICITS: in functional skills including ADLs, IADLs, strength, pain, flexibility, Gross motor control, mobility, balance, body mechanics, endurance, and decreased knowledge of use of DME, and psychosocial skills including coping strategies, environmental adaptation, habits, and routines and behaviors.   IMPAIRMENTS: are limiting patient from ADLs, IADLs, and social participation.   CO-MORBIDITIES: has co-morbidities such as neuropathy, back pain, obesity, MS, HTN that affects occupational performance. Patient will benefit from skilled OT to address above impairments and improve overall function.  MODIFICATION OR ASSISTANCE TO COMPLETE EVALUATION: No modification of tasks or assist necessary to complete an evaluation.  OT OCCUPATIONAL PROFILE AND HISTORY: Detailed assessment: Review of records and additional review of physical, cognitive, psychosocial history related to current functional performance.  CLINICAL DECISION MAKING: Moderate - several treatment options, min-mod task modification necessary  REHAB POTENTIAL: Good  EVALUATION COMPLEXITY: Moderate    PLAN:  OT FREQUENCY: 2x/week  OT DURATION: 12 weeks  PLANNED INTERVENTIONS: 97168 OT Re-evaluation, 97535 self care/ADL training, 29562  therapeutic exercise, 97530 therapeutic activity, 97112 neuromuscular re-education, 97140 manual therapy, 97116 gait training, 13086 moist heat, 97010 cryotherapy, balance training, functional mobility training, psychosocial skills training, energy conservation, coping strategies training, patient/family education, and DME  and/or AE instructions  RECOMMENDED OTHER SERVICES: None at this time (Pt currently receiving PT services in this clinic)  CONSULTED AND AGREED WITH PLAN OF CARE: Patient  PLAN FOR NEXT SESSION: see above  Marcus Sewer, MS, OTR/L  Casandra Claw, OT 10/12/2023, 5:52 PM

## 2023-10-10 ENCOUNTER — Telehealth: Payer: Self-pay | Admitting: Neurology

## 2023-10-10 NOTE — Therapy (Signed)
 OUTPATIENT PHYSICAL THERAPY NEURO TREATMENT       Patient Name: Lisa Williams MRN: [id number] DOB:1960/11/27, 63 y.o., female Today's Date: 10/11/2023   PCP: Overton Blotter  REFERRING PROVIDER: Overton Blotter  END OF SESSION:  PT End of Session - 10/11/23 1034     Visit Number 25    Number of Visits 47    Date for PT Re-Evaluation 12/27/23   corrected   PT Start Time 1102    PT Stop Time 1144    PT Time Calculation (min) 42 min    Equipment Utilized During Treatment Gait belt    Activity Tolerance Patient tolerated treatment well;No increased pain    Behavior During Therapy [facility] for tasks assessed/performed                                  Past Medical History:  Diagnosis Date   Abdominal pain, right upper quadrant    Back pain    Calculus of kidney 12/09/2013   Chronic back pain    unspecified   Chronic left shoulder pain 07/19/2015   Complication of anesthesia    Functional disorder of bladder    other   Galactorrhea 11/26/2014   Chronic    Hereditary and idiopathic neuropathy 08/19/2013   History of kidney stones    HPV test positive    Hypercholesteremia 08/19/2013   Hypertension    Incomplete bladder emptying    Microscopic hematuria    MS (multiple sclerosis) (HCC)    Muscle spasticity 05/21/2014   Nonspecific findings on examination of urine    other   Osteopenia    PONV (postoperative nausea and vomiting)    Status post laparoscopic supracervical hysterectomy 11/26/2014   Tobacco user 11/26/2014   Wrist fracture    Past Surgical History:  Procedure Laterality Date   bilateral tubal ligation  1996   BREAST CYST EXCISION Left 2002   CYST EXCISION Left 05/10/2022   Procedure: CYST REMOVAL;  Surgeon: Eldred Grego, MD;  Location: [facility] ORS;  Service: General;  Laterality: Left;   FRACTURE SURGERY     KNEE SURGERY     right   LAPAROSCOPIC SUPRACERVICAL HYSTERECTOMY  08/05/2013   ORIF WRIST FRACTURE  Left 01/17/2017   Procedure: OPEN REDUCTION INTERNAL FIXATION (ORIF) WRIST FRACTURE;  Surgeon: Jerlyn Moons, MD;  Location: [facility] OR[facility];  Service: Orthopedics;  Laterality: Left;   RADIOLOGY WITH ANESTHESIA N/A 03/18/2020   Procedure: MRI WITH ANESTHESIA CERVICAL SPINE AND BRAIN  WITH AND WITHOUT CONTRAST;  Surgeon: Radiologist, Medication, MD;  Location: [facility] OR;  Service: Radiology;  Laterality: N/A;   TUBAL LIGATION Bilateral    VAGINAL HYSTERECTOMY  03/2006   Patient Active Problem List   Diagnosis Date Noted   Sinus tachycardia 07/07/2023   Hypokalemia 07/29/2022   Weakness of both lower extremities 07/29/2022   Abscess of left groin 11/15/2021   Abnormal LFTs 11/15/2021   Acute respiratory disease due to COVID-19 virus 11/21/2020   Weakness    Hypoalbuminemia due to protein-calorie malnutrition [facility])    Neurogenic bowel    Neurogenic bladder    Labile blood pressure    Neuropathic pain    Abscess of female pelvis    SVT (supraventricular tachycardia) (HCC)    Radial styloid tenosynovitis 03/12/2018   Wheelchair dependence 02/27/2018   Localized osteoporosis with current pathological fracture with routine healing 01/19/2017   Wrist fracture 01/16/2017   Sprain of ankle  03/23/2016   Closed fracture of lateral malleolus 03/16/2016   Health care maintenance 01/24/2016   Blood pressure elevated without history of HTN 10/25/2015   Essential hypertension 10/25/2015   Multiple sclerosis (HCC) 10/02/2015   Chronic left shoulder pain 07/19/2015   Multiple sclerosis exacerbation (HCC) 07/14/2015   MS (multiple sclerosis) (HCC) 11/26/2014   Increased body mass index 11/26/2014   HPV test positive 11/26/2014   Status post laparoscopic supracervical hysterectomy 11/26/2014   Galactorrhea 11/26/2014   Back ache 05/21/2014   Adiposity 05/21/2014   Disordered sleep 05/21/2014   Muscle spasticity 05/21/2014   Spasticity 05/21/2014   Calculus of kidney 12/09/2013   Renal colic  12/09/2013   Hypercholesteremia 08/19/2013   Hereditary and idiopathic neuropathy 08/19/2013   Hypercholesterolemia without hypertriglyceridemia 08/19/2013   Bladder infection, chronic 07/25/2012   Disorder of bladder function 07/25/2012   Incomplete bladder emptying 07/25/2012   Microscopic hematuria 07/25/2012   Right upper quadrant pain 07/25/2012    ONSET DATE: 1995  REFERRING DIAG: MS  THERAPY DIAG:  Muscle weakness (generalized)  Multiple sclerosis exacerbation (HCC)  Other abnormalities of gait and mobility  Difficulty in walking, not elsewhere classified  Rationale for Evaluation and Treatment: Rehabilitation  SUBJECTIVE:                                                                                                                                                                                             SUBJECTIVE STATEMENT: Patient is very fatigued from OT, which she had prior to PT.  Pt accompanied by: self  PERTINENT HISTORY:  Patient is returning to PT s/p hospitalization.  s/p ORIF of R tibia shaft fracture 09/23/2022. Patient has weakness in BLE with RLE>LLE. She drives with hand controls. Patient has been diagnosed with MS in 1995. PMH includes: back pain, CBP, chronic L shoulder pain, galactorrhea, neuropathy, HPV, hypercholestermeia, HTN, MS, osteopenia, PONV, wrist fracture. Additional order for other closed fracture of proximal end of R tibia with routine healing. Still has to use [employer].   PAIN:  Are you having pain? Occasional pain in RLE; primarily in knee  PRECAUTIONS: Fall  RED FLAGS: None   WEIGHT BEARING RESTRICTIONS: No  FALLS: Has patient fallen in last 6 months? No  LIVING ENVIRONMENT: Lives with: lives with their family Lives in: House/apartment Stairs: ramp Has following equipment at home: Otho Blitz - 2 wheeled, Wheelchair (manual), shower chair, and Grab bars  PLOF: Independent with household mobility with device  PATIENT GOALS:  to get her independence back. To be able to get into car, toilet, and get dressed independently  OBJECTIVE:  Note: Objective measures  were completed at Evaluation unless otherwise noted.  DIAGNOSTIC FINDINGS: MRI of the brain 03/18/2020 showed multiple T2/FLAIR hyperintense foci in the periventricular, juxtacortical and deep white matter.  There were no infratentorial lesions noted.  None of the foci enhanced.   Due to severe claustrophobia, the study was done with conscious sedation in the hospital   COGNITION: Overall cognitive status: Within functional limits for tasks assessed   SENSATION: Lack of sensation in feet, loss in bilateral lateral aspect of knee  COORDINATION: Does not have the strength for functional LE heel slide test     MUSCLE TONE: BLE mild tone    POSTURE: rounded shoulders, forward head, anterior pelvic tilt, and weight shift left    LOWER EXTREMITY MMT:    MMT Right Eval Left Eval  Hip flexion 0.9 1.5  Hip extension    Hip abduction 1.8 2.6  Hip adduction 3.1 1.9  Hip internal rotation    Hip external rotation    Knee flexion 0.9 1.5  Knee extension 0.8 1.2  Ankle dorsiflexion    Ankle plantarflexion    Ankle inversion    Ankle eversion    (Blank rows = not tested)  BED MOBILITY:  Assess in future session due to limited time  TRANSFERS: Assistive device utilized: Bariatric RW with sit to stands, slide board to table  Sit to stand: unable to reach full stand Stand to sit: unable to reach full stand  Chair to chair: slide board with CGA     GAIT: Unable to ambulate at this time.   FUNCTIONAL TESTS:  Sit to stand: tricep press with BUE; unable to bring arm to walker.    Function In Sitting Test (FIST)  (1/2 femur on surface; hips/knees flexed to 90deg)   - indicate bed or mat table / step stool if used  SCORING KEY: 4 = Independent (completes task independently & successfully) 3 = Verbal Cues/Increased Time (completes task  independently & successfully and only needs more time/cues) 2 = Upper Extremity Support (must use UE for support or assistance to complete successfully) 1 = Needs Assistance (unable to complete w/o physical assist; DOCUMENT LEVEL: min, mod, max) 0 = Dependent (requires complete physical assist; unable to complete successfully even w/ physical assist)  Randomly Administer Once Throughout Exam  4 - Anterior Nudge (superior sternum)  4 - Posterior Nudge (between scapular spines)  4 - Lateral Nudge (to dominant side at acromion)     4 - Static sitting (30 seconds)  4 - Sitting, shake 'no' (left and right)  4 - Sitting, eyes closed (30 seconds)   0 - Sitting, lift foot (dominant side, lift foot 1 inch twice)    2 - Pick up object from behind (object at midline, hands breadth posterior)  3 - Forward reach (use dominant arm, must complete full motion) 2 - Lateral reach (use dominant arm, clear opposite ischial tuberosity) 2 - Pick up object from floor (from between feet)   2 - Posterior scooting (move backwards 2 inches)  2 - Anterior scooting (move forward 2 inches)  2 - Lateral scooting (move to dominant side 2 inches)    TOTAL = 39/56  Notes/comments: slide board tranfer to/from table   MCD > 5 points MCID for IP REHAB > 6 points  Function In Sitting Test (FIST) 08/14/23 (1/2 femur on surface; hips/knees flexed to 90deg)   - indicate bed or mat table / step stool if used  SCORING KEY: 4 = Independent (completes task independently &  successfully) 3 = Verbal Cues/Increased Time (completes task independently & successfully and only needs more time/cues) 2 = Upper Extremity Support (must use UE for support or assistance to complete successfully) 1 = Needs Assistance (unable to complete w/o physical assist; DOCUMENT LEVEL: min, mod, max) 0 = Dependent (requires complete physical assist; unable to complete successfully even w/ physical assist)  Randomly Administer Once Throughout Exam   4 - Anterior Nudge (superior sternum)  4 - Posterior Nudge (between scapular spines)  4 - Lateral Nudge (to dominant side at acromion)     4 - Static sitting (30 seconds)  4 - Sitting, shake 'no' (left and right)  4 - Sitting, eyes closed (30 seconds)   0 - Sitting, lift foot (dominant side, lift foot 1 inch twice)    4 - Pick up object from behind (object at midline, hands breadth posterior)  4 - Forward reach (use dominant arm, must complete full motion) 4 - Lateral reach (use dominant arm, clear opposite ischial tuberosity) 2 - Pick up object from floor (from between feet)   3 - Posterior scooting (move backwards 2 inches)  3 - Anterior scooting (move forward 2 inches)  3 - Lateral scooting (move to dominant side 2 inches)    TOTAL = 47/56    MCD > 5 points MCID for IP REHAB > 6 points                                                                                                                              TREATMENT DATE: 10/11/23  There.Act: In // bars with UE support:  sit to stand with cue forward weight shift and hand placement. Cues for LE recruitment;  upon standing attempted walking length of // bars: unable to reach full length; takes 4 steps. Then stops. With w/c follow Multiple STS in // bars CGA. VC's for anterior weight shifting  TherEx: Seated: Shoulder snow angels 15x Pectoral stretch/cross body adduction/abduction 15x Hamstring isometric press 10x each LE  Activity Description: 6 pods on a table reach and tap Activity Setting:  The Blaze Pod Random setting was chosen to enhance cognitive processing and agility, providing an unpredictable environment to simulate real-world scenarios, and fostering quick reactions and adaptability.   Number of Pods:  6 Cycles/Sets:  4 Duration (Time or Hit Count):  30 seconds - 1 minutes     PATIENT EDUCATION: Education details: goals, POC, HEP  Person educated: Patient Education method: Explanation,  Demonstration, Tactile cues, Verbal cues, and Handouts Education comprehension: verbalized understanding, returned demonstration, verbal cues required, tactile cues required, and needs further education  HOME EXERCISE PROGRAM: Stand 3x/day in stander   GOALS: Goals reviewed with patient? Yes  SHORT TERM GOALS: Target date: 08/08/2023    Patient will be independent in home exercise program to improve strength/mobility for better functional independence with ADLs.  Baseline: 4/8: compliance Goal status: MET    LONG TERM GOALS: Target date: 12/27/2023  Patient will tolerate five consecutive stands with UE support from wheelchair to standing to improve functional mobility.  Baseline: unable to perform 4/8:unable to perform full stand 5/20: perform next session 5/29: two full stands with CGA and cushion on her seat Goal status: Ongoing   2.  Patient will ambulate 10 ft with bRW with wheelchair follow.  Baseline:  unable to ambulate 4/8: unable to ambulate 5/20: able to ambulate length of // bars in previous session 5/27: ambulate 2.5 lengths of // bars with two seated rest breaks with min A for weight shift and wheelchair follow  Goal status: partially met  3.  Patient will improve FIST score >6 points to demonstrate improved stability and ability to perform ADLs.  Baseline:  3/6: 39/56 4/8: 47/ 56 5/20: 48/56 Goal status: MET  4.  Patient will perform toileting with mod I at home for improved independence.  Baseline: 3/6: requires assistance 4/8: requires dependence on machine 5/20: requires assistance with use of wipe buddy.  Goal status: Partially Met    ASSESSMENT:  CLINICAL IMPRESSION: Patient is very fatigued initially at start of session and is unable to ambulate full length of // bars. Patient is highly motivated despite her fatigue and is able to tolerate multiple standing attempts. Core activation with blazepods tolerated well. The patient demonstrated significant  progress while utilizing [employer], showcasing improved coordination, balance, and cognitive function. The incorporation of dual-tasking technology with color recognition and association with specific movements in Blaze Pods was strategically chosen to provide a dynamic training environment, enabling the patient to engage in simultaneous physical and cognitive tasks. This unique approach enhances not only their physical abilities but also fosters increased neural connectivity and mental awareness, contributing to a well-rounded and effective rehabilitation and training experience.   Patient will benefit from skilled physical therapy to improve mobility, stability, and independence.   OBJECTIVE IMPAIRMENTS: Abnormal gait, cardiopulmonary status limiting activity, decreased activity tolerance, decreased balance, decreased coordination, decreased endurance, decreased mobility, difficulty walking, decreased ROM, decreased strength, hypomobility, increased fascial restrictions, impaired perceived functional ability, impaired flexibility, impaired sensation, improper body mechanics, and postural dysfunction.   ACTIVITY LIMITATIONS: carrying, lifting, bending, sitting, standing, squatting, sleeping, stairs, transfers, bed mobility, continence, bathing, toileting, dressing, self feeding, reach over head, hygiene/grooming, locomotion level, and caring for others  PARTICIPATION LIMITATIONS: meal prep, cleaning, laundry, interpersonal relationship, driving, shopping, community activity, and church  PERSONAL FACTORS: Age, Past/current experiences, Time since onset of injury/illness/exacerbation, Transportation, and 3+ comorbidities: ack pain, CBP, chronic L shoulder pain, galactorrhea, neuropathy, HPV, hypercholestermeia, HTN, MS, osteopenia, PONV, wrist fracture are also affecting patient's functional outcome.   REHAB POTENTIAL: Good  CLINICAL DECISION MAKING: Evolving/moderate complexity  EVALUATION COMPLEXITY:  Moderate  PLAN:  PT FREQUENCY: 2x/week  PT DURATION: 12 weeks  PLANNED INTERVENTIONS: 97164- PT Re-evaluation, 97110-Therapeutic exercises, 97530- Therapeutic activity, 97112- Neuromuscular re-education, 97535- Self Care, 16109- Manual therapy, [phone]- Gait training, [phone]- Orthotic Fit/training, [phone]- Canalith repositioning, [id number]- Aquatic Therapy, [phone]- Electrical stimulation (unattended), [phone]- Electrical stimulation (manual), [id number]- Ultrasound, 57846- Traction (mechanical), Patient/Family education, Balance training, Stair training, Taping, Dry Needling, Joint mobilization, Joint manipulation, Spinal manipulation, Spinal mobilization, Scar mobilization, Compression bandaging, Vestibular training, Visual/preceptual remediation/compensation, Cognitive remediation, DME instructions, Cryotherapy, Moist heat, and Biofeedback  PLAN FOR NEXT SESSION: sit to stands; try supported walking    Consandra Laske  Brain Cahill, PT, DPT Physical Therapist - [facility]  Outpatient Physical Therapy- Main Campus [phone]    10/11/2023, 12:33 PM

## 2023-10-10 NOTE — Telephone Encounter (Signed)
 [email or web address]@ [employer] states she is aware the order for the standing frame was sent to [employer] but not them, she said she is trying to avoid a denial for pt, this information needs to be sent to them for review.  Their [phone] fax#[phone]

## 2023-10-10 NOTE — Telephone Encounter (Signed)
 Order standing fame faxed to the below fax # to [place].

## 2023-10-11 ENCOUNTER — Ambulatory Visit: Payer: PPO

## 2023-10-11 ENCOUNTER — Ambulatory Visit

## 2023-10-11 DIAGNOSIS — R278 Other lack of coordination: Secondary | ICD-10-CM

## 2023-10-11 DIAGNOSIS — G35 Multiple sclerosis: Secondary | ICD-10-CM

## 2023-10-11 DIAGNOSIS — R2689 Other abnormalities of gait and mobility: Secondary | ICD-10-CM

## 2023-10-11 DIAGNOSIS — R262 Difficulty in walking, not elsewhere classified: Secondary | ICD-10-CM

## 2023-10-11 DIAGNOSIS — M6281 Muscle weakness (generalized): Secondary | ICD-10-CM

## 2023-10-12 ENCOUNTER — Telehealth: Payer: Self-pay | Admitting: Internal Medicine

## 2023-10-12 NOTE — Telephone Encounter (Signed)
 Patient called and said that her sister ask Dr Geralyn Knee if she would take her as a new patient. Patient states Dr Geralyn Knee said yes. Front office went to sisters chart, but did not see a note. Will Dr Geralyn Knee take patient?p

## 2023-10-13 NOTE — Therapy (Signed)
 OUTPATIENT OCCUPATIONAL THERAPY NEURO TREATMENT NOTE   Patient Name: Lisa Williams MRN: [id number] DOB:[birth year], 63 y.o., female Today's Date: 10/13/2023  PCP: Dr. Overton Blotter   Neurologist Dr. Octavio Ben at [facility] REFERRING PROVIDER: Dr. Overton Blotter    END OF SESSION:  OT End of Session - 10/13/23 0953     Visit Number 12    Number of Visits 24    Date for OT Re-Evaluation 11/08/23    Authorization Time Period Reporting period beginning 09/28/23    OT Start Time 1015    OT Stop Time 1100    OT Time Calculation (min) 45 min    Equipment Utilized During Treatment manual wc    Activity Tolerance Patient tolerated treatment well    Behavior During Therapy [facility] for tasks assessed/performed            Past Medical History:  Diagnosis Date   Abdominal pain, right upper quadrant    Back pain    Calculus of kidney 12/09/2013   Chronic back pain    unspecified   Chronic left shoulder pain 07/19/2015   Complication of anesthesia    Functional disorder of bladder    other   Galactorrhea 11/26/2014   Chronic    Hereditary and idiopathic neuropathy 08/19/2013   History of kidney stones    HPV test positive    Hypercholesteremia 08/19/2013   Hypertension    Incomplete bladder emptying    Microscopic hematuria    MS (multiple sclerosis) (HCC)    Muscle spasticity 05/21/2014   Nonspecific findings on examination of urine    other   Osteopenia    PONV (postoperative nausea and vomiting)    Status post laparoscopic supracervical hysterectomy 11/26/2014   Tobacco user 11/26/2014   Wrist fracture    Past Surgical History:  Procedure Laterality Date   bilateral tubal ligation  1996   BREAST CYST EXCISION Left 2002   CYST EXCISION Left 05/10/2022   Procedure: CYST REMOVAL;  Surgeon: Eldred Grego, MD;  Location: [facility] ORS;  Service: General;  Laterality: Left;   FRACTURE SURGERY     KNEE SURGERY     right   LAPAROSCOPIC SUPRACERVICAL HYSTERECTOMY   08/05/2013   ORIF WRIST FRACTURE Left 01/17/2017   Procedure: OPEN REDUCTION INTERNAL FIXATION (ORIF) WRIST FRACTURE;  Surgeon: Jerlyn Moons, MD;  Location: [facility] ORS;  Service: Orthopedics;  Laterality: Left;   RADIOLOGY WITH ANESTHESIA N/A 03/18/2020   Procedure: MRI WITH ANESTHESIA CERVICAL SPINE AND BRAIN  WITH AND WITHOUT CONTRAST;  Surgeon: Radiologist, Medication, MD;  Location: [facility] OR;  Service: Radiology;  Laterality: N/A;   TUBAL LIGATION Bilateral    VAGINAL HYSTERECTOMY  03/2006   Patient Active Problem List   Diagnosis Date Noted   Sinus tachycardia 07/07/2023   Hypokalemia 07/29/2022   Weakness of both lower extremities 07/29/2022   Abscess of left groin 11/15/2021   Abnormal LFTs 11/15/2021   Acute respiratory disease due to COVID-19 virus 11/21/2020   Weakness    Hypoalbuminemia due to protein-calorie malnutrition [facility])    Neurogenic bowel    Neurogenic bladder    Labile blood pressure    Neuropathic pain    Abscess of female pelvis    SVT (supraventricular tachycardia) (HCC)    Radial styloid tenosynovitis 03/12/2018   Wheelchair dependence 02/27/2018   Localized osteoporosis with current pathological fracture with routine healing 01/19/2017   Wrist fracture 01/16/2017   Sprain of ankle 03/23/2016   Closed fracture of lateral malleolus 03/16/2016  Health care maintenance 01/24/2016   Blood pressure elevated without history of HTN 10/25/2015   Essential hypertension 10/25/2015   Multiple sclerosis (HCC) 10/02/2015   Chronic left shoulder pain 07/19/2015   Multiple sclerosis exacerbation (HCC) 07/14/2015   MS (multiple sclerosis) (HCC) 11/26/2014   Increased body mass index 11/26/2014   HPV test positive 11/26/2014   Status post laparoscopic supracervical hysterectomy 11/26/2014   Galactorrhea 11/26/2014   Back ache 05/21/2014   Adiposity 05/21/2014   Disordered sleep 05/21/2014   Muscle spasticity 05/21/2014   Spasticity 05/21/2014   Calculus of kidney  12/09/2013   Renal colic 12/09/2013   Hypercholesteremia 08/19/2013   Hereditary and idiopathic neuropathy 08/19/2013   Hypercholesterolemia without hypertriglyceridemia 08/19/2013   Bladder infection, chronic 07/25/2012   Disorder of bladder function 07/25/2012   Incomplete bladder emptying 07/25/2012   Microscopic hematuria 07/25/2012   Right upper quadrant pain 07/25/2012   ONSET DATE: 5/24 [name] and broke R leg which caused beginning of ADL decline; diagnosed with MS in 1995)  REFERRING DIAG: MS  THERAPY DIAG:  Muscle weakness (generalized)  Other lack of coordination  Multiple sclerosis exacerbation (HCC)  Rationale for Evaluation and Treatment: Rehabilitation  SUBJECTIVE:  SUBJECTIVE STATEMENT: Pt reports doing well today. Pt accompanied by: self  PERTINENT HISTORY: Pt reports increased difficulty with basic self care tasks since breaking her R leg last May.  Since that time, mobility and ADLs have declined, with pt requiring assist from private caregivers and family to manage bathing, toileting, dressing, and functional transfers.  PRECAUTIONS: Fall  WEIGHT BEARING RESTRICTIONS: No  PAIN: 10/11/23: No pain reported today Are you having pain? No; occasional pain in R lower back, but not today  FALLS: Has patient fallen in last 6 months? No Last fall was May 17 of 2024  LIVING ENVIRONMENT: Lives with: lives with their family (including mother who has dementia and sister [name])  Lives in: 2 level home but pt resides on main level Stairs: ramp  Has following equipment at home: Wheelchair (manual), Shower bench, and hand held shower shower, 1 grab bar in the shower, 3in1 commode, sit to stand lift (electric), FWW, hospital bed  PLOF: modified indep with ADL/IADLs prior to May of 1610   PATIENT GOALS: Increase independence with basic self care tasks  OBJECTIVE:  Note: Objective measures were completed at Evaluation unless otherwise noted.  HAND DOMINANCE:  Right  ADLs:  Overall ADLs: assist provided from paid caregiver and sister Transfers/ambulation related to ADLs: sit to stand lift for all transfers, set up for sliding board in/out of bed  Eating: indep  Grooming: modified indep (wc level in mom's bathroom)  UB Dressing: set up (can't access her bedroom closet)  LB Dressing: able to sit on side of bed to don all LB clothing; assist to hike pants/underwear Toileting: assist with clothing management d/t standing within electric lift Bathing: bed bath with sister helping to wash backside Tub Shower transfers: N/A; pt has walk in shower in mom's bathroom (wc does fit in this bathroom but pt reports inability to transfer from wc and requires arm rests on a bench for a successful transfer attempt from any DME).  Tub bench is in pt's bathroom, but lift does not fit through bathroom doorway and pt reports inability to transfer up from tub bench (currently unable to manage either transfer)   Equipment: see above  IADLs: Shopping: [employer] for shopping Light housekeeping: modified indep from wc level Meal Prep: modified indep from wc level [employer]  mobility: relies on community transportation or family members Medication management: indep Financial management: indep Handwriting: NT; pt denies any FMC challenges  MOBILITY STATUS: Hx of falls  POSTURE COMMENTS:  Rounded shoulders, forward head, anterior pelvic tilt, and weight shift left   ACTIVITY TOLERANCE: Activity tolerance: Per PT; currently standing 30-60 sec within Light Gait harness/lift; currently non-ambulatory  UPPER EXTREMITY ROM:  BUEs WFL  UPPER EXTREMITY MMT:     MMT Right eval Left eval  Shoulder flexion 4+ 4  Shoulder abduction 4+ 4  Shoulder adduction    Shoulder extension    Shoulder internal rotation 4+ 4+  Shoulder external rotation 4 4  Middle trapezius    Lower trapezius    Elbow flexion 4+ 4+  Elbow extension 4+ 4+  Wrist flexion 4+ 4+  Wrist  extension 4+ 4+  Wrist ulnar deviation    Wrist radial deviation    Wrist pronation    Wrist supination    (Blank rows = not tested)  HAND FUNCTION/COORDINATION:  R/L WNL; pt denies any coordination deficits/ able to manipulate pills/clothing fasteners/etc without difficulty  SENSATION: WFL  EDEMA: No visible edema in BUEs  MUSCLE TONE: BUEs WNL  COGNITION: Overall cognitive status: Within functional limits for tasks assessed  VISION: wears glasses all the time, no reports of diplopia  PERCEPTION: Not tested  PRAXIS: WFL  OBSERVATIONS:  Pt pleasant, cooperative, and motivated to work towards improving indep with ADLs.                                                                                                     TREATMENT DATE: 10/11/23 Self Care: -Scoot pivot trf wc<>toilet: min A to and from toilet for safe positioning/reduce fall risk  Therapeutic Activity: -Core stability with ball toss; ball thrown to challenge reaching in all directions outside BOS: completed without UE support/removal of wc arm rests   PATIENT EDUCATION: Education details: toilet transfer technique Person educated: Patient Education method: Explanation, demo, vc Education comprehension: verbalized understanding, demonstrated understanding with min A, further training needed  HOME EXERCISE PROGRAM: IR A/AAROM to promote shoulder flexibility for peri care/bathing  GOALS: Goals reviewed with patient? Yes  SHORT TERM GOALS: Target date: 09/27/23  Pt will perform bed bath with set up.  Baseline: Eval: Min-mod A for posterior washing; 09/28/23: Min A for posterior washing  Goal status: in progress  2.  Pt will utilize sliding board for wc<>drop arm commode transfer with SBA. Baseline: Eval: Currently using electric lift for transfer to [facility]; 09/28/23: Not yet completed at home but transfers to commode have been easier without sliding board d/t pt implementing a scoot pivot. Goal status: d/c (pt  does not prefer sliding board for commode transfer)  3.  Pt will be indep to perform HEP for maintaining BUE strength for ADLs and functional transfers. Baseline: Eval: HEP not yet initiated; 09/28/23: Pt is working on core stability exercises from wc, cane stretches for bilat shoulder IR, and tricep extension via wc pushups Goal status: indep  LONG TERM GOALS: Target date: 11/08/23  Pt will perform sink  bath with min A. Baseline: Eval: Currently bed bathing d/t inability to stand at sink without electric lift (lift does not fit into pt's bathroom); 09/28/23: still completing bed bath as pt is not yet able to stand  Goal status: ongoing  2.  Pt will perform wc<>tub bench transfer with min A. Baseline: Eval: Unable; 09/28/23: Performed in OT clinic with min A, but not yet in the home Goal status: ongoing  3.  Pt will perform squat pivot transfer wc<>BSC with SBA. Baseline: Eval: Eval: Currently using electric lift for transfer to [facility]; 09/28/23: Not yet completed in clinic with [facility], and using lift for BSC in the home still Goal status: ongoing  4. Pt will perform scoot transfer wc<>toilet using grab bars with supv to allow toileting in community setting.  Baseline: Eval: Pt limits community outings d/t requiring use of lift for [facility] transfers; 09/28/23: Pt has performed 2 successful scoot pivot  Transfers to toilet in OT clinic with min A, extra time.  Further trials with clothing management on toilet needed, but pt can lower and hike  pants while sititng edge of mat via lateral leaning and min A to maintain core stability.  Goal status: ongoing  5.  Pt will tolerate standing x1 min with close supv and BUE support to manage clothing in prep for toileting. Baseline: Eval: Currently standing in electric lift only, or within parallel bars without lift; 09/28/23: Not yet attempted Goal status: in progress  ASSESSMENT: CLINICAL IMPRESSION: Pt demonstrated indep positioning of wc set up in prep for  transfer to and from toilet.  Able to perform successful scoot pivot wc<>toilet with min a in each direction.  Continued focus on core stability with ball toss noted above.  Pt requires frequent rest breaks and demonstrates hesitation to reach outside BOS d/t weak core strength.  Pt will continue to benefit from skilled OT to work towards above noted goals in OT poc, working to maximize indep with daily tasks while reducing burden of care on caregivers.   PERFORMANCE DEFICITS: in functional skills including ADLs, IADLs, strength, pain, flexibility, Gross motor control, mobility, balance, body mechanics, endurance, and decreased knowledge of use of DME, and psychosocial skills including coping strategies, environmental adaptation, habits, and routines and behaviors.   IMPAIRMENTS: are limiting patient from ADLs, IADLs, and social participation.   CO-MORBIDITIES: has co-morbidities such as neuropathy, back pain, obesity, MS, HTN that affects occupational performance. Patient will benefit from skilled OT to address above impairments and improve overall function.  MODIFICATION OR ASSISTANCE TO COMPLETE EVALUATION: No modification of tasks or assist necessary to complete an evaluation.  OT OCCUPATIONAL PROFILE AND HISTORY: Detailed assessment: Review of records and additional review of physical, cognitive, psychosocial history related to current functional performance.  CLINICAL DECISION MAKING: Moderate - several treatment options, min-mod task modification necessary  REHAB POTENTIAL: Good  EVALUATION COMPLEXITY: Moderate    PLAN:  OT FREQUENCY: 2x/week  OT DURATION: 12 weeks  PLANNED INTERVENTIONS: 97168 OT Re-evaluation, 97535 self care/ADL training, 81191 therapeutic exercise, 97530 therapeutic activity, 97112 neuromuscular re-education, 97140 manual therapy, 97116 gait training, 47829 moist heat, 97010 cryotherapy, balance training, functional mobility training, psychosocial skills  training, energy conservation, coping strategies training, patient/family education, and DME and/or AE instructions  RECOMMENDED OTHER SERVICES: None at this time (Pt currently receiving PT services in this clinic)  CONSULTED AND AGREED WITH PLAN OF CARE: Patient  PLAN FOR NEXT SESSION: see above  Marcus Sewer, MS, OTR/L  Casandra Claw, OT 10/13/2023,  9:55 AM

## 2023-10-15 NOTE — Therapy (Signed)
 OUTPATIENT PHYSICAL THERAPY NEURO TREATMENT       Patient Name: Lisa Williams MRN: [id number] DOB:1960-06-29, 63 y.o., female Today's Date: 10/15/2023   PCP: Overton Blotter  REFERRING PROVIDER: Overton Blotter  END OF SESSION:                         Past Medical History:  Diagnosis Date   Abdominal pain, right upper quadrant    Back pain    Calculus of kidney 12/09/2013   Chronic back pain    unspecified   Chronic left shoulder pain 07/19/2015   Complication of anesthesia    Functional disorder of bladder    other   Galactorrhea 11/26/2014   Chronic    Hereditary and idiopathic neuropathy 08/19/2013   History of kidney stones    HPV test positive    Hypercholesteremia 08/19/2013   Hypertension    Incomplete bladder emptying    Microscopic hematuria    MS (multiple sclerosis) (HCC)    Muscle spasticity 05/21/2014   Nonspecific findings on examination of urine    other   Osteopenia    PONV (postoperative nausea and vomiting)    Status post laparoscopic supracervical hysterectomy 11/26/2014   Tobacco user 11/26/2014   Wrist fracture    Past Surgical History:  Procedure Laterality Date   bilateral tubal ligation  1996   BREAST CYST EXCISION Left 2002   CYST EXCISION Left 05/10/2022   Procedure: CYST REMOVAL;  Surgeon: Eldred Grego, MD;  Location: [facility] OR[facility];  Service: General;  Laterality: Left;   FRACTURE SURGERY     KNEE SURGERY     right   LAPAROSCOPIC SUPRACERVICAL HYSTERECTOMY  08/05/2013   ORIF WRIST FRACTURE Left 01/17/2017   Procedure: OPEN REDUCTION INTERNAL FIXATION (ORIF) WRIST FRACTURE;  Surgeon: Jerlyn Moons, MD;  Location: [facility] OR[facility];  Service: Orthopedics;  Laterality: Left;   RADIOLOGY WITH ANESTHESIA N/A 03/18/2020   Procedure: MRI WITH ANESTHESIA CERVICAL SPINE AND BRAIN  WITH AND WITHOUT CONTRAST;  Surgeon: Radiologist, Medication, MD;  Location: [facility] OR;  Service: Radiology;  Laterality: N/A;   TUBAL  LIGATION Bilateral    VAGINAL HYSTERECTOMY  03/2006   Patient Active Problem List   Diagnosis Date Noted   Sinus tachycardia 07/07/2023   Hypokalemia 07/29/2022   Weakness of both lower extremities 07/29/2022   Abscess of left groin 11/15/2021   Abnormal LFTs 11/15/2021   Acute respiratory disease due to COVID-19 virus 11/21/2020   Weakness    Hypoalbuminemia due to protein-calorie malnutrition [facility])    Neurogenic bowel    Neurogenic bladder    Labile blood pressure    Neuropathic pain    Abscess of female pelvis    SVT (supraventricular tachycardia) (HCC)    Radial styloid tenosynovitis 03/12/2018   Wheelchair dependence 02/27/2018   Localized osteoporosis with current pathological fracture with routine healing 01/19/2017   Wrist fracture 01/16/2017   Sprain of ankle 03/23/2016   Closed fracture of lateral malleolus 03/16/2016   Health care maintenance 01/24/2016   Blood pressure elevated without history of HTN 10/25/2015   Essential hypertension 10/25/2015   Multiple sclerosis (HCC) 10/02/2015   Chronic left shoulder pain 07/19/2015   Multiple sclerosis exacerbation (HCC) 07/14/2015   MS (multiple sclerosis) (HCC) 11/26/2014   Increased body mass index 11/26/2014   HPV test positive 11/26/2014   Status post laparoscopic supracervical hysterectomy 11/26/2014   Galactorrhea 11/26/2014   Back ache 05/21/2014   Adiposity 05/21/2014   Disordered  sleep 05/21/2014   Muscle spasticity 05/21/2014   Spasticity 05/21/2014   Calculus of kidney 12/09/2013   Renal colic 12/09/2013   Hypercholesteremia 08/19/2013   Hereditary and idiopathic neuropathy 08/19/2013   Hypercholesterolemia without hypertriglyceridemia 08/19/2013   Bladder infection, chronic 07/25/2012   Disorder of bladder function 07/25/2012   Incomplete bladder emptying 07/25/2012   Microscopic hematuria 07/25/2012   Right upper quadrant pain 07/25/2012    ONSET DATE: 1995  REFERRING DIAG: MS  THERAPY DIAG:   No diagnosis found.  Rationale for Evaluation and Treatment: Rehabilitation  SUBJECTIVE:                                                                                                                                                                                             SUBJECTIVE STATEMENT: *** Pt accompanied by: self  PERTINENT HISTORY:  Patient is returning to PT s/p hospitalization.  s/p ORIF of R tibia shaft fracture 09/23/2022. Patient has weakness in BLE with RLE>LLE. She drives with hand controls. Patient has been diagnosed with MS in 1995. PMH includes: back pain, CBP, chronic L shoulder pain, galactorrhea, neuropathy, HPV, hypercholestermeia, HTN, MS, osteopenia, PONV, wrist fracture. Additional order for other closed fracture of proximal end of R tibia with routine healing. Still has to use [employer].   PAIN:  Are you having pain? Occasional pain in RLE; primarily in knee  PRECAUTIONS: Fall  RED FLAGS: None   WEIGHT BEARING RESTRICTIONS: No  FALLS: Has patient fallen in last 6 months? No  LIVING ENVIRONMENT: Lives with: lives with their family Lives in: House/apartment Stairs: ramp Has following equipment at home: Otho Blitz - 2 wheeled, Wheelchair (manual), shower chair, and Grab bars  PLOF: Independent with household mobility with device  PATIENT GOALS: to get her independence back. To be able to get into car, toilet, and get dressed independently  OBJECTIVE:  Note: Objective measures were completed at Evaluation unless otherwise noted.  DIAGNOSTIC FINDINGS: MRI of the brain 03/18/2020 showed multiple T2/FLAIR hyperintense foci in the periventricular, juxtacortical and deep white matter.  There were no infratentorial lesions noted.  None of the foci enhanced.   Due to severe claustrophobia, the study was done with conscious sedation in the hospital   COGNITION: Overall cognitive status: Within functional limits for tasks assessed   SENSATION: Lack of  sensation in feet, loss in bilateral lateral aspect of knee  COORDINATION: Does not have the strength for functional LE heel slide test     MUSCLE TONE: BLE mild tone    POSTURE: rounded shoulders, forward head, anterior pelvic tilt, and weight shift left    LOWER  EXTREMITY MMT:    MMT Right Eval Left Eval  Hip flexion 0.9 1.5  Hip extension    Hip abduction 1.8 2.6  Hip adduction 3.1 1.9  Hip internal rotation    Hip external rotation    Knee flexion 0.9 1.5  Knee extension 0.8 1.2  Ankle dorsiflexion    Ankle plantarflexion    Ankle inversion    Ankle eversion    (Blank rows = not tested)  BED MOBILITY:  Assess in future session due to limited time  TRANSFERS: Assistive device utilized: Bariatric RW with sit to stands, slide board to table  Sit to stand: unable to reach full stand Stand to sit: unable to reach full stand  Chair to chair: slide board with CGA     GAIT: Unable to ambulate at this time.   FUNCTIONAL TESTS:  Sit to stand: tricep press with BUE; unable to bring arm to walker.    Function In Sitting Test (FIST)  (1/2 femur on surface; hips/knees flexed to 90deg)   - indicate bed or mat table / step stool if used  SCORING KEY: 4 = Independent (completes task independently & successfully) 3 = Verbal Cues/Increased Time (completes task independently & successfully and only needs more time/cues) 2 = Upper Extremity Support (must use UE for support or assistance to complete successfully) 1 = Needs Assistance (unable to complete w/o physical assist; DOCUMENT LEVEL: min, mod, max) 0 = Dependent (requires complete physical assist; unable to complete successfully even w/ physical assist)  Randomly Administer Once Throughout Exam  4 - Anterior Nudge (superior sternum)  4 - Posterior Nudge (between scapular spines)  4 - Lateral Nudge (to dominant side at acromion)     4 - Static sitting (30 seconds)  4 - Sitting, shake 'no' (left and right)  4 -  Sitting, eyes closed (30 seconds)   0 - Sitting, lift foot (dominant side, lift foot 1 inch twice)    2 - Pick up object from behind (object at midline, hands breadth posterior)  3 - Forward reach (use dominant arm, must complete full motion) 2 - Lateral reach (use dominant arm, clear opposite ischial tuberosity) 2 - Pick up object from floor (from between feet)   2 - Posterior scooting (move backwards 2 inches)  2 - Anterior scooting (move forward 2 inches)  2 - Lateral scooting (move to dominant side 2 inches)    TOTAL = 39/56  Notes/comments: slide board tranfer to/from table   MCD > 5 points MCID for IP REHAB > 6 points  Function In Sitting Test (FIST) 08/14/23 (1/2 femur on surface; hips/knees flexed to 90deg)   - indicate bed or mat table / step stool if used  SCORING KEY: 4 = Independent (completes task independently & successfully) 3 = Verbal Cues/Increased Time (completes task independently & successfully and only needs more time/cues) 2 = Upper Extremity Support (must use UE for support or assistance to complete successfully) 1 = Needs Assistance (unable to complete w/o physical assist; DOCUMENT LEVEL: min, mod, max) 0 = Dependent (requires complete physical assist; unable to complete successfully even w/ physical assist)  Randomly Administer Once Throughout Exam  4 - Anterior Nudge (superior sternum)  4 - Posterior Nudge (between scapular spines)  4 - Lateral Nudge (to dominant side at acromion)     4 - Static sitting (30 seconds)  4 - Sitting, shake 'no' (left and right)  4 - Sitting, eyes closed (30 seconds)   0 - Sitting,  lift foot (dominant side, lift foot 1 inch twice)    4 - Pick up object from behind (object at midline, hands breadth posterior)  4 - Forward reach (use dominant arm, must complete full motion) 4 - Lateral reach (use dominant arm, clear opposite ischial tuberosity) 2 - Pick up object from floor (from between feet)   3 - Posterior scooting  (move backwards 2 inches)  3 - Anterior scooting (move forward 2 inches)  3 - Lateral scooting (move to dominant side 2 inches)    TOTAL = 47/56    MCD > 5 points MCID for IP REHAB > 6 points                                                                                                                              TREATMENT DATE: 10/15/23  There.Act: In // bars with UE support:  sit to stand with cue forward weight shift and hand placement. Cues for LE recruitment;  upon standing attempted walking length of // bars: unable to reach full length; takes 4 steps. Then stops. With w/c follow Multiple STS in // bars CGA. VC's for anterior weight shifting  TherEx: Seated: Shoulder snow angels 15x Pectoral stretch/cross body adduction/abduction 15x Hamstring isometric press 10x each LE  Activity Description: 6 pods on a table reach and tap Activity Setting:  The Blaze Pod Random setting was chosen to enhance cognitive processing and agility, providing an unpredictable environment to simulate real-world scenarios, and fostering quick reactions and adaptability.   Number of Pods:  6 Cycles/Sets:  4 Duration (Time or Hit Count):  30 seconds - 1 minutes     PATIENT EDUCATION: Education details: goals, POC, HEP  Person educated: Patient Education method: Explanation, Demonstration, Tactile cues, Verbal cues, and Handouts Education comprehension: verbalized understanding, returned demonstration, verbal cues required, tactile cues required, and needs further education  HOME EXERCISE PROGRAM: Stand 3x/day in stander   GOALS: Goals reviewed with patient? Yes  SHORT TERM GOALS: Target date: 08/08/2023    Patient will be independent in home exercise program to improve strength/mobility for better functional independence with ADLs.  Baseline: 4/8: compliance Goal status: MET    LONG TERM GOALS: Target date: 12/27/2023   Patient will tolerate five consecutive stands with UE  support from wheelchair to standing to improve functional mobility.  Baseline: unable to perform 4/8:unable to perform full stand 5/20: perform next session 5/29: two full stands with CGA and cushion on her seat Goal status: Ongoing   2.  Patient will ambulate 10 ft with bRW with wheelchair follow.  Baseline:  unable to ambulate 4/8: unable to ambulate 5/20: able to ambulate length of // bars in previous session 5/27: ambulate 2.5 lengths of // bars with two seated rest breaks with min A for weight shift and wheelchair follow  Goal status: partially met  3.  Patient will improve FIST score >6 points to demonstrate improved stability and ability to  perform ADLs.  Baseline:  3/6: 39/56 4/8: 47/ 56 5/20: 48/56 Goal status: MET  4.  Patient will perform toileting with mod I at home for improved independence.  Baseline: 3/6: requires assistance 4/8: requires dependence on machine 5/20: requires assistance with use of wipe buddy.  Goal status: Partially Met    ASSESSMENT:  CLINICAL IMPRESSION: ***  Patient will benefit from skilled physical therapy to improve mobility, stability, and independence.   OBJECTIVE IMPAIRMENTS: Abnormal gait, cardiopulmonary status limiting activity, decreased activity tolerance, decreased balance, decreased coordination, decreased endurance, decreased mobility, difficulty walking, decreased ROM, decreased strength, hypomobility, increased fascial restrictions, impaired perceived functional ability, impaired flexibility, impaired sensation, improper body mechanics, and postural dysfunction.   ACTIVITY LIMITATIONS: carrying, lifting, bending, sitting, standing, squatting, sleeping, stairs, transfers, bed mobility, continence, bathing, toileting, dressing, self feeding, reach over head, hygiene/grooming, locomotion level, and caring for others  PARTICIPATION LIMITATIONS: meal prep, cleaning, laundry, interpersonal relationship, driving, shopping, community activity, and  church  PERSONAL FACTORS: Age, Past/current experiences, Time since onset of injury/illness/exacerbation, Transportation, and 3+ comorbidities: ack pain, CBP, chronic L shoulder pain, galactorrhea, neuropathy, HPV, hypercholestermeia, HTN, MS, osteopenia, PONV, wrist fracture are also affecting patient's functional outcome.   REHAB POTENTIAL: Good  CLINICAL DECISION MAKING: Evolving/moderate complexity  EVALUATION COMPLEXITY: Moderate  PLAN:  PT FREQUENCY: 2x/week  PT DURATION: 12 weeks  PLANNED INTERVENTIONS: 97164- PT Re-evaluation, 97110-Therapeutic exercises, 97530- Therapeutic activity, 97112- Neuromuscular re-education, 97535- Self Care, 65784- Manual therapy, [phone]- Gait training, [phone]- Orthotic Fit/training, [phone]- Canalith repositioning, [id number]- Aquatic Therapy, [phone]- Electrical stimulation (unattended), [phone]- Electrical stimulation (manual), [id number]- Ultrasound, [id number]- Traction (mechanical), Patient/Family education, Balance training, Stair training, Taping, Dry Needling, Joint mobilization, Joint manipulation, Spinal manipulation, Spinal mobilization, Scar mobilization, Compression bandaging, Vestibular training, Visual/preceptual remediation/compensation, Cognitive remediation, DME instructions, Cryotherapy, Moist heat, and Biofeedback  PLAN FOR NEXT SESSION: sit to stands; try supported walking    Suhana Wilner  Brain Cahill, PT, DPT Physical Therapist - [facility]  Outpatient Physical Therapy- Main Campus [phone]    10/15/2023, 3:39 PM

## 2023-10-15 NOTE — Telephone Encounter (Signed)
 I called Lisa Williams at [facility]. Patient will receive [employer] on 6/12 and start on 6/15.

## 2023-10-15 NOTE — Telephone Encounter (Signed)
 Ok

## 2023-10-16 ENCOUNTER — Ambulatory Visit

## 2023-10-16 ENCOUNTER — Ambulatory Visit: Payer: PPO

## 2023-10-16 DIAGNOSIS — M6281 Muscle weakness (generalized): Secondary | ICD-10-CM | POA: Diagnosis not present

## 2023-10-16 DIAGNOSIS — G35 Multiple sclerosis: Secondary | ICD-10-CM

## 2023-10-16 DIAGNOSIS — R262 Difficulty in walking, not elsewhere classified: Secondary | ICD-10-CM

## 2023-10-16 DIAGNOSIS — R2689 Other abnormalities of gait and mobility: Secondary | ICD-10-CM

## 2023-10-16 NOTE — Therapy (Signed)
 OUTPATIENT OCCUPATIONAL THERAPY NEURO TREATMENT NOTE   Patient Name: Lisa Williams MRN: [id number] DOB:07-Aug-1960, 63 y.o., female Today's Date: 10/16/2023  PCP: Dr. Overton Williams   Neurologist Dr. Octavio Williams at [facility] REFERRING PROVIDER: Dr. Overton Williams    END OF SESSION:  OT End of Session - 10/16/23 1344     Visit Number 13    Number of Visits 24    Date for OT Re-Evaluation 11/08/23    Authorization Time Period Reporting period beginning 09/28/23    OT Start Time 1100    OT Stop Time 1145    OT Time Calculation (min) 45 min    Equipment Utilized During Treatment manual wc    Activity Tolerance Patient tolerated treatment well    Behavior During Therapy [facility] for tasks assessed/performed            Past Medical History:  Diagnosis Date   Abdominal pain, right upper quadrant    Back pain    Calculus of kidney 12/09/2013   Chronic back pain    unspecified   Chronic left shoulder pain 07/19/2015   Complication of anesthesia    Functional disorder of bladder    other   Galactorrhea 11/26/2014   Chronic    Hereditary and idiopathic neuropathy 08/19/2013   History of kidney stones    HPV test positive    Hypercholesteremia 08/19/2013   Hypertension    Incomplete bladder emptying    Microscopic hematuria    MS (multiple sclerosis) (HCC)    Muscle spasticity 05/21/2014   Nonspecific findings on examination of urine    other   Osteopenia    PONV (postoperative nausea and vomiting)    Status post laparoscopic supracervical hysterectomy 11/26/2014   Tobacco user 11/26/2014   Wrist fracture    Past Surgical History:  Procedure Laterality Date   bilateral tubal ligation  1996   BREAST CYST EXCISION Left 2002   CYST EXCISION Left 05/10/2022   Procedure: CYST REMOVAL;  Surgeon: Lisa Grego, MD;  Location: [facility] ORS;  Service: General;  Laterality: Left;   FRACTURE SURGERY     KNEE SURGERY     right   LAPAROSCOPIC SUPRACERVICAL HYSTERECTOMY   08/05/2013   ORIF WRIST FRACTURE Left 01/17/2017   Procedure: OPEN REDUCTION INTERNAL FIXATION (ORIF) WRIST FRACTURE;  Surgeon: Lisa Moons, MD;  Location: [facility] ORS;  Service: Orthopedics;  Laterality: Left;   RADIOLOGY WITH ANESTHESIA N/A 03/18/2020   Procedure: MRI WITH ANESTHESIA CERVICAL SPINE AND BRAIN  WITH AND WITHOUT CONTRAST;  Surgeon: Radiologist, Medication, MD;  Location: [facility] OR;  Service: Radiology;  Laterality: N/A;   TUBAL LIGATION Bilateral    VAGINAL HYSTERECTOMY  03/2006   Patient Active Problem List   Diagnosis Date Noted   Sinus tachycardia 07/07/2023   Hypokalemia 07/29/2022   Weakness of both lower extremities 07/29/2022   Abscess of left groin 11/15/2021   Abnormal LFTs 11/15/2021   Acute respiratory disease due to COVID-19 virus 11/21/2020   Weakness    Hypoalbuminemia due to protein-calorie malnutrition [facility])    Neurogenic bowel    Neurogenic bladder    Labile blood pressure    Neuropathic pain    Abscess of female pelvis    SVT (supraventricular tachycardia) (HCC)    Radial styloid tenosynovitis 03/12/2018   Wheelchair dependence 02/27/2018   Localized osteoporosis with current pathological fracture with routine healing 01/19/2017   Wrist fracture 01/16/2017   Sprain of ankle 03/23/2016   Closed fracture of lateral malleolus 03/16/2016  Health care maintenance 01/24/2016   Blood pressure elevated without history of HTN 10/25/2015   Essential hypertension 10/25/2015   Multiple sclerosis (HCC) 10/02/2015   Chronic left shoulder pain 07/19/2015   Multiple sclerosis exacerbation (HCC) 07/14/2015   MS (multiple sclerosis) (HCC) 11/26/2014   Increased body mass index 11/26/2014   HPV test positive 11/26/2014   Status post laparoscopic supracervical hysterectomy 11/26/2014   Galactorrhea 11/26/2014   Back ache 05/21/2014   Adiposity 05/21/2014   Disordered sleep 05/21/2014   Muscle spasticity 05/21/2014   Spasticity 05/21/2014   Calculus of kidney  12/09/2013   Renal colic 12/09/2013   Hypercholesteremia 08/19/2013   Hereditary and idiopathic neuropathy 08/19/2013   Hypercholesterolemia without hypertriglyceridemia 08/19/2013   Bladder infection, chronic 07/25/2012   Disorder of bladder function 07/25/2012   Incomplete bladder emptying 07/25/2012   Microscopic hematuria 07/25/2012   Right upper quadrant pain 07/25/2012   ONSET DATE: 5/24 Lisa Williams and broke R leg which caused beginning of ADL decline; diagnosed with MS in 1995)  REFERRING DIAG: MS  THERAPY DIAG:  Multiple sclerosis exacerbation (HCC)  Muscle weakness (generalized)  Rationale for Evaluation and Treatment: Rehabilitation  SUBJECTIVE:  SUBJECTIVE STATEMENT: Pt reports doing well today. Pt accompanied by: self  PERTINENT HISTORY: Pt reports increased difficulty with basic self care tasks since breaking her R leg last May.  Since that time, mobility and ADLs have declined, with pt requiring assist from private caregivers and family to manage bathing, toileting, dressing, and functional transfers.  PRECAUTIONS: Fall  WEIGHT BEARING RESTRICTIONS: No  PAIN: 10/11/23: No pain reported today Are you having pain? No; occasional pain in R lower back, but not today  FALLS: Has patient fallen in last 6 months? No Last fall was May 17 of 2024  LIVING ENVIRONMENT: Lives with: lives with their family (including mother who has dementia and sister Lisa Williams)  Lives in: 2 level home but pt resides on main level Stairs: ramp  Has following equipment at home: Wheelchair (manual), Shower bench, and hand held shower shower, 1 grab bar in the shower, 3in1 commode, sit to stand lift (electric), FWW, hospital bed  PLOF: modified indep with ADL/IADLs prior to May of 2595   PATIENT GOALS: Increase independence with basic self care tasks  OBJECTIVE:  Note: Objective measures were completed at Evaluation unless otherwise noted.  HAND DOMINANCE: Right  ADLs:  Overall ADLs: assist  provided from paid caregiver and sister Transfers/ambulation related to ADLs: sit to stand lift for all transfers, set up for sliding board in/out of bed  Eating: indep  Grooming: modified indep (wc level in mom's bathroom)  UB Dressing: set up (can't access her bedroom closet)  LB Dressing: able to sit on side of bed to don all LB clothing; assist to hike pants/underwear Toileting: assist with clothing management d/t standing within electric lift Bathing: bed bath with sister helping to wash backside Tub Shower transfers: N/A; pt has walk in shower in mom's bathroom (wc does fit in this bathroom but pt reports inability to transfer from wc and requires arm rests on a bench for a successful transfer attempt from any DME).  Tub bench is in pt's bathroom, but lift does not fit through bathroom doorway and pt reports inability to transfer up from tub bench (currently unable to manage either transfer)   Equipment: see above  IADLs: Shopping: [employer] for shopping Light housekeeping: modified indep from wc level Meal Prep: modified indep from wc level Community mobility: relies on community transportation  or family members Medication management: indep Financial management: indep Handwriting: NT; pt denies any FMC challenges  MOBILITY STATUS: Hx of falls  POSTURE COMMENTS:  Rounded shoulders, forward head, anterior pelvic tilt, and weight shift left   ACTIVITY TOLERANCE: Activity tolerance: Per PT; currently standing 30-60 sec within Light Gait harness/lift; currently non-ambulatory  UPPER EXTREMITY ROM:  BUEs WFL  UPPER EXTREMITY MMT:     MMT Right eval Left eval  Shoulder flexion 4+ 4  Shoulder abduction 4+ 4  Shoulder adduction    Shoulder extension    Shoulder internal rotation 4+ 4+  Shoulder external rotation 4 4  Middle trapezius    Lower trapezius    Elbow flexion 4+ 4+  Elbow extension 4+ 4+  Wrist flexion 4+ 4+  Wrist extension 4+ 4+  Wrist ulnar deviation     Wrist radial deviation    Wrist pronation    Wrist supination    (Blank rows = not tested)  HAND FUNCTION/COORDINATION:  R/L WNL; pt denies any coordination deficits/ able to manipulate pills/clothing fasteners/etc without difficulty  SENSATION: WFL  EDEMA: No visible edema in BUEs  MUSCLE TONE: BUEs WNL  COGNITION: Overall cognitive status: Within functional limits for tasks assessed  VISION: wears glasses all the time, no reports of diplopia  PERCEPTION: Not tested  PRAXIS: WFL  OBSERVATIONS:  Pt pleasant, cooperative, and motivated to work towards improving indep with ADLs.                                                                                                     TREATMENT DATE: 10/16/23 Self Care: -lateral scoot t/f wc<>mat with and without slide board, assist to place slide board.  -education on falls prevention strategies and HEP  Therapeutic Activity: -Core stability with ball toss; ball thrown to challenge reaching in all directions outside BOS. Completed seated on mat without UE support.   Therapeutic Exercise:  -Breathing exercises - reviewed importance of breathing t/o activity to improve core engagement -seated BUE exercises 2 set x 10 reps each: shoulder flexion, shoulder abduction, and horizontal adduction.    PATIENT EDUCATION: Education details: toilet transfer technique Person educated: Patient Education method: Explanation, demo, vc Education comprehension: verbalized understanding, demonstrated understanding with min A, further training needed  HOME EXERCISE PROGRAM: IR A/AAROM to promote shoulder flexibility for peri care/bathing  GOALS: Goals reviewed with patient? Yes  SHORT TERM GOALS: Target date: 09/27/23  Pt will perform bed bath with set up.  Baseline: Eval: Min-mod A for posterior washing; 09/28/23: Min A for posterior washing  Goal status: in progress  2.  Pt will utilize sliding board for wc<>drop arm commode transfer with  SBA. Baseline: Eval: Currently using electric lift for transfer to [facility]; 09/28/23: Not yet completed at home but transfers to commode have been easier without sliding board d/t pt implementing a scoot pivot. Goal status: d/c (pt does not prefer sliding board for commode transfer)  3.  Pt will be indep to perform HEP for maintaining BUE strength for ADLs and functional transfers. Baseline: Eval: HEP not yet initiated; 09/28/23:  Pt is working on core stability exercises from wc, cane stretches for bilat shoulder IR, and tricep extension via wc pushups Goal status: indep  LONG TERM GOALS: Target date: 11/08/23  Pt will perform sink bath with min A. Baseline: Eval: Currently bed bathing d/t inability to stand at sink without electric lift (lift does not fit into pt's bathroom); 09/28/23: still completing bed bath as pt is not yet able to stand  Goal status: ongoing  2.  Pt will perform wc<>tub bench transfer with min A. Baseline: Eval: Unable; 09/28/23: Performed in OT clinic with min A, but not yet in the home Goal status: ongoing  3.  Pt will perform squat pivot transfer wc<>BSC with SBA. Baseline: Eval: Eval: Currently using electric lift for transfer to [facility]; 09/28/23: Not yet completed in clinic with [facility], and using lift for BSC in the home still Goal status: ongoing  4. Pt will perform scoot transfer wc<>toilet using grab bars with supv to allow toileting in community setting.  Baseline: Eval: Pt limits community outings d/t requiring use of lift for [facility] transfers; 09/28/23: Pt has performed 2 successful scoot pivot  Transfers to toilet in OT clinic with min A, extra time.  Further trials with clothing management on toilet needed, but pt can lower and hike  pants while sititng edge of mat via lateral leaning and min A to maintain core stability.  Goal status: ongoing  5.  Pt will tolerate standing x1 min with close supv and BUE support to manage clothing in prep for toileting. Baseline: Eval:  Currently standing in electric lift only, or within parallel bars without lift; 09/28/23: Not yet attempted Goal status: in progress  ASSESSMENT: CLINICAL IMPRESSION: Pt demonstrated MOD I transfer to/from mat with and without slide board. Continued focus on core stability with ball toss, seated UE reaching, and breathing exercises reviewed. Pt requires frequent rest breaks and demonstrates hesitation to reach outside BOS due to fear of falling. Pt will continue to benefit from skilled OT to work towards above noted goals in OT poc, working to maximize indep with daily tasks while reducing burden of care on caregivers.   PERFORMANCE DEFICITS: in functional skills including ADLs, IADLs, strength, pain, flexibility, Gross motor control, mobility, balance, body mechanics, endurance, and decreased knowledge of use of DME, and psychosocial skills including coping strategies, environmental adaptation, habits, and routines and behaviors.   IMPAIRMENTS: are limiting patient from ADLs, IADLs, and social participation.   CO-MORBIDITIES: has co-morbidities such as neuropathy, back pain, obesity, MS, HTN that affects occupational performance. Patient will benefit from skilled OT to address above impairments and improve overall function.  MODIFICATION OR ASSISTANCE TO COMPLETE EVALUATION: No modification of tasks or assist necessary to complete an evaluation.  OT OCCUPATIONAL PROFILE AND HISTORY: Detailed assessment: Review of records and additional review of physical, cognitive, psychosocial history related to current functional performance.  CLINICAL DECISION MAKING: Moderate - several treatment options, min-mod task modification necessary  REHAB POTENTIAL: Good  EVALUATION COMPLEXITY: Moderate    PLAN:  OT FREQUENCY: 2x/week  OT DURATION: 12 weeks  PLANNED INTERVENTIONS: 97168 OT Re-evaluation, 97535 self care/ADL training, 40102 therapeutic exercise, 97530 therapeutic activity, 97112  neuromuscular re-education, 97140 manual therapy, 97116 gait training, 72536 moist heat, 97010 cryotherapy, balance training, functional mobility training, psychosocial skills training, energy conservation, coping strategies training, patient/family education, and DME and/or AE instructions  RECOMMENDED OTHER SERVICES: None at this time (Pt currently receiving PT services in this clinic)  CONSULTED AND AGREED WITH PLAN  OF CARE: Patient  PLAN FOR NEXT SESSION: see above  Gordan Latina, M.S. OTR/L  10/16/23, 1:45 PM  ascom [phone]   Alfonzo Ill, OT 10/16/2023, 1:45 PM

## 2023-10-17 NOTE — Therapy (Signed)
 OUTPATIENT PHYSICAL THERAPY NEURO TREATMENT       Patient Name: Lisa Williams MRN: [id number] DOB:11-Jun-1960, 63 y.o., female Today's Date: 10/18/2023   PCP: Overton Blotter  REFERRING PROVIDER: Overton Blotter  END OF SESSION:  PT End of Session - 10/18/23 1058     Visit Number 27    Number of Visits 47    Date for PT Re-Evaluation 12/27/23   corrected   PT Start Time 1100    PT Stop Time 1144    PT Time Calculation (min) 44 min    Equipment Utilized During Treatment Gait belt    Activity Tolerance Patient tolerated treatment well;No increased pain    Behavior During Therapy [facility] for tasks assessed/performed                                 Past Medical History:  Diagnosis Date   Abdominal pain, right upper quadrant    Back pain    Calculus of kidney 12/09/2013   Chronic back pain    unspecified   Chronic left shoulder pain 07/19/2015   Complication of anesthesia    Functional disorder of bladder    other   Galactorrhea 11/26/2014   Chronic    Hereditary and idiopathic neuropathy 08/19/2013   History of kidney stones    HPV test positive    Hypercholesteremia 08/19/2013   Hypertension    Incomplete bladder emptying    Microscopic hematuria    MS (multiple sclerosis) (HCC)    Muscle spasticity 05/21/2014   Nonspecific findings on examination of urine    other   Osteopenia    PONV (postoperative nausea and vomiting)    Status post laparoscopic supracervical hysterectomy 11/26/2014   Tobacco user 11/26/2014   Wrist fracture    Past Surgical History:  Procedure Laterality Date   bilateral tubal ligation  1996   BREAST CYST EXCISION Left 2002   CYST EXCISION Left 05/10/2022   Procedure: CYST REMOVAL;  Surgeon: Eldred Grego, MD;  Location: [facility] ORS;  Service: General;  Laterality: Left;   FRACTURE SURGERY     KNEE SURGERY     right   LAPAROSCOPIC SUPRACERVICAL HYSTERECTOMY  08/05/2013   ORIF WRIST FRACTURE  Left 01/17/2017   Procedure: OPEN REDUCTION INTERNAL FIXATION (ORIF) WRIST FRACTURE;  Surgeon: Jerlyn Moons, MD;  Location: [facility] ORS;  Service: Orthopedics;  Laterality: Left;   RADIOLOGY WITH ANESTHESIA N/A 03/18/2020   Procedure: MRI WITH ANESTHESIA CERVICAL SPINE AND BRAIN  WITH AND WITHOUT CONTRAST;  Surgeon: Radiologist, Medication, MD;  Location: [facility] OR;  Service: Radiology;  Laterality: N/A;   TUBAL LIGATION Bilateral    VAGINAL HYSTERECTOMY  03/2006   Patient Active Problem List   Diagnosis Date Noted   Sinus tachycardia 07/07/2023   Hypokalemia 07/29/2022   Weakness of both lower extremities 07/29/2022   Abscess of left groin 11/15/2021   Abnormal LFTs 11/15/2021   Acute respiratory disease due to COVID-19 virus 11/21/2020   Weakness    Hypoalbuminemia due to protein-calorie malnutrition [facility])    Neurogenic bowel    Neurogenic bladder    Labile blood pressure    Neuropathic pain    Abscess of female pelvis    SVT (supraventricular tachycardia) (HCC)    Radial styloid tenosynovitis 03/12/2018   Wheelchair dependence 02/27/2018   Localized osteoporosis with current pathological fracture with routine healing 01/19/2017   Wrist fracture 01/16/2017   Sprain of ankle 03/23/2016  Closed fracture of lateral malleolus 03/16/2016   Health care maintenance 01/24/2016   Blood pressure elevated without history of HTN 10/25/2015   Essential hypertension 10/25/2015   Multiple sclerosis (HCC) 10/02/2015   Chronic left shoulder pain 07/19/2015   Multiple sclerosis exacerbation (HCC) 07/14/2015   MS (multiple sclerosis) (HCC) 11/26/2014   Increased body mass index 11/26/2014   HPV test positive 11/26/2014   Status post laparoscopic supracervical hysterectomy 11/26/2014   Galactorrhea 11/26/2014   Back ache 05/21/2014   Adiposity 05/21/2014   Disordered sleep 05/21/2014   Muscle spasticity 05/21/2014   Spasticity 05/21/2014   Calculus of kidney 12/09/2013   Renal colic  12/09/2013   Hypercholesteremia 08/19/2013   Hereditary and idiopathic neuropathy 08/19/2013   Hypercholesterolemia without hypertriglyceridemia 08/19/2013   Bladder infection, chronic 07/25/2012   Disorder of bladder function 07/25/2012   Incomplete bladder emptying 07/25/2012   Microscopic hematuria 07/25/2012   Right upper quadrant pain 07/25/2012    ONSET DATE: 1995  REFERRING DIAG: MS  THERAPY DIAG:  Multiple sclerosis exacerbation (HCC)  Muscle weakness (generalized)  Other abnormalities of gait and mobility  Difficulty in walking, not elsewhere classified  Rationale for Evaluation and Treatment: Rehabilitation  SUBJECTIVE:                                                                                                                                                                                             SUBJECTIVE STATEMENT: Patient's [birth year] is [birth year], is eager to attempt walking today.  Pt accompanied by: self  PERTINENT HISTORY:  Patient is returning to PT s/p hospitalization.  s/p ORIF of R tibia shaft fracture 09/23/2022. Patient has weakness in BLE with RLE>LLE. She drives with hand controls. Patient has been diagnosed with MS in 1995. PMH includes: back pain, CBP, chronic L shoulder pain, galactorrhea, neuropathy, HPV, hypercholestermeia, HTN, MS, osteopenia, PONV, wrist fracture. Additional order for other closed fracture of proximal end of R tibia with routine healing. Still has to use [employer].   PAIN:  Are you having pain? Occasional pain in RLE; primarily in knee  PRECAUTIONS: Fall  RED FLAGS: None   WEIGHT BEARING RESTRICTIONS: No  FALLS: Has patient fallen in last 6 months? No  LIVING ENVIRONMENT: Lives with: lives with their family Lives in: House/apartment Stairs: ramp Has following equipment at home: Otho Blitz - 2 wheeled, Wheelchair (manual), shower chair, and Grab bars  PLOF: Independent with household mobility with device  PATIENT  GOALS: to get her independence back. To be able to get into car, toilet, and get dressed independently  OBJECTIVE:  Note: Objective measures were completed at Evaluation unless  otherwise noted.  DIAGNOSTIC FINDINGS: MRI of the brain 03/18/2020 showed multiple T2/FLAIR hyperintense foci in the periventricular, juxtacortical and deep white matter.  There were no infratentorial lesions noted.  None of the foci enhanced.   Due to severe claustrophobia, the study was done with conscious sedation in the hospital   COGNITION: Overall cognitive status: Within functional limits for tasks assessed   SENSATION: Lack of sensation in feet, loss in bilateral lateral aspect of knee  COORDINATION: Does not have the strength for functional LE heel slide test     MUSCLE TONE: BLE mild tone    POSTURE: rounded shoulders, forward head, anterior pelvic tilt, and weight shift left    LOWER EXTREMITY MMT:    MMT Right Eval Left Eval  Hip flexion 0.9 1.5  Hip extension    Hip abduction 1.8 2.6  Hip adduction 3.1 1.9  Hip internal rotation    Hip external rotation    Knee flexion 0.9 1.5  Knee extension 0.8 1.2  Ankle dorsiflexion    Ankle plantarflexion    Ankle inversion    Ankle eversion    (Blank rows = not tested)  BED MOBILITY:  Assess in future session due to limited time  TRANSFERS: Assistive device utilized: Bariatric RW with sit to stands, slide board to table  Sit to stand: unable to reach full stand Stand to sit: unable to reach full stand  Chair to chair: slide board with CGA     GAIT: Unable to ambulate at this time.   FUNCTIONAL TESTS:  Sit to stand: tricep press with BUE; unable to bring arm to walker.    Function In Sitting Test (FIST)  (1/2 femur on surface; hips/knees flexed to 90deg)   - indicate bed or mat table / step stool if used  SCORING KEY: 4 = Independent (completes task independently & successfully) 3 = Verbal Cues/Increased Time (completes task  independently & successfully and only needs more time/cues) 2 = Upper Extremity Support (must use UE for support or assistance to complete successfully) 1 = Needs Assistance (unable to complete w/o physical assist; DOCUMENT LEVEL: min, mod, max) 0 = Dependent (requires complete physical assist; unable to complete successfully even w/ physical assist)  Randomly Administer Once Throughout Exam  4 - Anterior Nudge (superior sternum)  4 - Posterior Nudge (between scapular spines)  4 - Lateral Nudge (to dominant side at acromion)     4 - Static sitting (30 seconds)  4 - Sitting, shake 'no' (left and right)  4 - Sitting, eyes closed (30 seconds)   0 - Sitting, lift foot (dominant side, lift foot 1 inch twice)    2 - Pick up object from behind (object at midline, hands breadth posterior)  3 - Forward reach (use dominant arm, must complete full motion) 2 - Lateral reach (use dominant arm, clear opposite ischial tuberosity) 2 - Pick up object from floor (from between feet)   2 - Posterior scooting (move backwards 2 inches)  2 - Anterior scooting (move forward 2 inches)  2 - Lateral scooting (move to dominant side 2 inches)    TOTAL = 39/56  Notes/comments: slide board tranfer to/from table   MCD > 5 points MCID for IP REHAB > 6 points  Function In Sitting Test (FIST) 08/14/23 (1/2 femur on surface; hips/knees flexed to 90deg)   - indicate bed or mat table / step stool if used  SCORING KEY: 4 = Independent (completes task independently & successfully) 3 = Verbal  Cues/Increased Time (completes task independently & successfully and only needs more time/cues) 2 = Upper Extremity Support (must use UE for support or assistance to complete successfully) 1 = Needs Assistance (unable to complete w/o physical assist; DOCUMENT LEVEL: min, mod, max) 0 = Dependent (requires complete physical assist; unable to complete successfully even w/ physical assist)  Randomly Administer Once Throughout Exam   4 - Anterior Nudge (superior sternum)  4 - Posterior Nudge (between scapular spines)  4 - Lateral Nudge (to dominant side at acromion)     4 - Static sitting (30 seconds)  4 - Sitting, shake 'no' (left and right)  4 - Sitting, eyes closed (30 seconds)   0 - Sitting, lift foot (dominant side, lift foot 1 inch twice)    4 - Pick up object from behind (object at midline, hands breadth posterior)  4 - Forward reach (use dominant arm, must complete full motion) 4 - Lateral reach (use dominant arm, clear opposite ischial tuberosity) 2 - Pick up object from floor (from between feet)   3 - Posterior scooting (move backwards 2 inches)  3 - Anterior scooting (move forward 2 inches)  3 - Lateral scooting (move to dominant side 2 inches)    TOTAL = 47/56    MCD > 5 points MCID for IP REHAB > 6 points                                                                                                                              TREATMENT DATE: 10/18/23  There.Act: Sit to stand from wheelchair with extra cushion; BrW and x2 person CGA;  -ambulate 5 ft with BrW follow and CGA Attempt to stand x 3 additional trials; significant fasciculations due to fatigue.  Seated boxing: cues for sequencing, body mechanics, and pace/rhythm for functional contraction and timing of muscle recruitment: -Cross body punches to mitts on PT hands with no back support for focused core stabilization with crossing midline 2x 60 seconds  -cross body punches with duck for core stabilization 60 seconds -Cross body  secondary combination of elbow for full cross body rotation to PT mitt for trunk stability, coordination, and cardiovascular challenge x 60 seconds -Upper cut to mitts in PT hands for core activation without back support with perturbations 60 seconds  TherEx: Seated: Shoulder snow angels 15x Pectoral stretch/cross body adduction/abduction 15x Hamstring isometric press 10x each LE     PATIENT  EDUCATION: Education details: goals, POC, HEP  Person educated: Patient Education method: Explanation, Demonstration, Tactile cues, Verbal cues, and Handouts Education comprehension: verbalized understanding, returned demonstration, verbal cues required, tactile cues required, and needs further education  HOME EXERCISE PROGRAM: Stand 3x/day in stander   GOALS: Goals reviewed with patient? Yes  SHORT TERM GOALS: Target date: 08/08/2023    Patient will be independent in home exercise program to improve strength/mobility for better functional independence with ADLs.  Baseline: 4/8: compliance Goal status: MET    LONG  TERM GOALS: Target date: 12/27/2023   Patient will tolerate five consecutive stands with UE support from wheelchair to standing to improve functional mobility.  Baseline: unable to perform 4/8:unable to perform full stand 5/20: perform next session 5/29: two full stands with CGA and cushion on her seat Goal status: Ongoing   2.  Patient will ambulate 10 ft with bRW with wheelchair follow.  Baseline:  unable to ambulate 4/8: unable to ambulate 5/20: able to ambulate length of // bars in previous session 5/27: ambulate 2.5 lengths of // bars with two seated rest breaks with min A for weight shift and wheelchair follow  Goal status: partially met  3.  Patient will improve FIST score >6 points to demonstrate improved stability and ability to perform ADLs.  Baseline:  3/6: 39/56 4/8: 47/ 56 5/20: 48/56 Goal status: MET  4.  Patient will perform toileting with mod I at home for improved independence.  Baseline: 3/6: requires assistance 4/8: requires dependence on machine 5/20: requires assistance with use of wipe buddy.  Goal status: Partially Met    ASSESSMENT:  CLINICAL IMPRESSION: Patient ambulated for first time with BrW and wheelchair follow.  She was able to ambulate 5 ft this session demonstrating improved mobility and carryover between sessions. Patient has  excellent motivation throughout session and eager to progress her mobility at this time.  Patient will benefit from skilled physical therapy to improve mobility, stability, and independence.   OBJECTIVE IMPAIRMENTS: Abnormal gait, cardiopulmonary status limiting activity, decreased activity tolerance, decreased balance, decreased coordination, decreased endurance, decreased mobility, difficulty walking, decreased ROM, decreased strength, hypomobility, increased fascial restrictions, impaired perceived functional ability, impaired flexibility, impaired sensation, improper body mechanics, and postural dysfunction.   ACTIVITY LIMITATIONS: carrying, lifting, bending, sitting, standing, squatting, sleeping, stairs, transfers, bed mobility, continence, bathing, toileting, dressing, self feeding, reach over head, hygiene/grooming, locomotion level, and caring for others  PARTICIPATION LIMITATIONS: meal prep, cleaning, laundry, interpersonal relationship, driving, shopping, community activity, and church  PERSONAL FACTORS: Age, Past/current experiences, Time since onset of injury/illness/exacerbation, Transportation, and 3+ comorbidities: ack pain, CBP, chronic L shoulder pain, galactorrhea, neuropathy, HPV, hypercholestermeia, HTN, MS, osteopenia, PONV, wrist fracture are also affecting patient's functional outcome.   REHAB POTENTIAL: Good  CLINICAL DECISION MAKING: Evolving/moderate complexity  EVALUATION COMPLEXITY: Moderate  PLAN:  PT FREQUENCY: 2x/week  PT DURATION: 12 weeks  PLANNED INTERVENTIONS: 97164- PT Re-evaluation, 97110-Therapeutic exercises, 97530- Therapeutic activity, 97112- Neuromuscular re-education, 97535- Self Care, 16109- Manual therapy, [phone]- Gait training, [phone]- Orthotic Fit/training, [phone]- Canalith repositioning, [id number]- Aquatic Therapy, [phone]- Electrical stimulation (unattended), [phone]- Electrical stimulation (manual), [id number]- Ultrasound, [id number]- Traction (mechanical),  Patient/Family education, Balance training, Stair training, Taping, Dry Needling, Joint mobilization, Joint manipulation, Spinal manipulation, Spinal mobilization, Scar mobilization, Compression bandaging, Vestibular training, Visual/preceptual remediation/compensation, Cognitive remediation, DME instructions, Cryotherapy, Moist heat, and Biofeedback  PLAN FOR NEXT SESSION: sit to stands; try supported walking    Sequoya Hogsett  Brain Cahill, PT, DPT Physical Therapist - [facility]  Outpatient Physical Therapy- Main Campus [phone]    10/18/2023, 11:44 AM

## 2023-10-18 ENCOUNTER — Ambulatory Visit

## 2023-10-18 ENCOUNTER — Ambulatory Visit: Payer: PPO

## 2023-10-18 DIAGNOSIS — M6281 Muscle weakness (generalized): Secondary | ICD-10-CM | POA: Diagnosis not present

## 2023-10-18 DIAGNOSIS — G35 Multiple sclerosis: Secondary | ICD-10-CM

## 2023-10-18 DIAGNOSIS — R278 Other lack of coordination: Secondary | ICD-10-CM

## 2023-10-18 DIAGNOSIS — R262 Difficulty in walking, not elsewhere classified: Secondary | ICD-10-CM

## 2023-10-18 DIAGNOSIS — R2689 Other abnormalities of gait and mobility: Secondary | ICD-10-CM

## 2023-10-19 NOTE — Therapy (Signed)
 OUTPATIENT OCCUPATIONAL THERAPY NEURO TREATMENT NOTE   Patient Name: Lisa Williams MRN: [id number] DOB:1961/01/16, 63 y.o., female Today's Date: 10/19/2023  PCP: Dr. Overton Blotter   Neurologist Dr. Octavio Ben at [facility] REFERRING PROVIDER: Dr. Overton Blotter    END OF SESSION:  OT End of Session - 10/19/23 2035     Visit Number 14    Number of Visits 24    Date for OT Re-Evaluation 11/08/23    Authorization Time Period Reporting period beginning 09/28/23    Progress Note Due on Visit 10    OT Start Time 1015    OT Stop Time 1055    OT Time Calculation (min) 40 min    Equipment Utilized During Treatment manual wc    Activity Tolerance Patient tolerated treatment well    Behavior During Therapy [facility] for tasks assessed/performed         Past Medical History:  Diagnosis Date   Abdominal pain, right upper quadrant    Back pain    Calculus of kidney 12/09/2013   Chronic back pain    unspecified   Chronic left shoulder pain 07/19/2015   Complication of anesthesia    Functional disorder of bladder    other   Galactorrhea 11/26/2014   Chronic    Hereditary and idiopathic neuropathy 08/19/2013   History of kidney stones    HPV test positive    Hypercholesteremia 08/19/2013   Hypertension    Incomplete bladder emptying    Microscopic hematuria    MS (multiple sclerosis) (HCC)    Muscle spasticity 05/21/2014   Nonspecific findings on examination of urine    other   Osteopenia    PONV (postoperative nausea and vomiting)    Status post laparoscopic supracervical hysterectomy 11/26/2014   Tobacco user 11/26/2014   Wrist fracture    Past Surgical History:  Procedure Laterality Date   bilateral tubal ligation  1996   BREAST CYST EXCISION Left 2002   CYST EXCISION Left 05/10/2022   Procedure: CYST REMOVAL;  Surgeon: Eldred Grego, MD;  Location: [facility] ORS;  Service: General;  Laterality: Left;   FRACTURE SURGERY     KNEE SURGERY     right   LAPAROSCOPIC  SUPRACERVICAL HYSTERECTOMY  08/05/2013   ORIF WRIST FRACTURE Left 01/17/2017   Procedure: OPEN REDUCTION INTERNAL FIXATION (ORIF) WRIST FRACTURE;  Surgeon: Jerlyn Moons, MD;  Location: [facility] ORS;  Service: Orthopedics;  Laterality: Left;   RADIOLOGY WITH ANESTHESIA N/A 03/18/2020   Procedure: MRI WITH ANESTHESIA CERVICAL SPINE AND BRAIN  WITH AND WITHOUT CONTRAST;  Surgeon: Radiologist, Medication, MD;  Location: [facility] OR;  Service: Radiology;  Laterality: N/A;   TUBAL LIGATION Bilateral    VAGINAL HYSTERECTOMY  03/2006   Patient Active Problem List   Diagnosis Date Noted   Sinus tachycardia 07/07/2023   Hypokalemia 07/29/2022   Weakness of both lower extremities 07/29/2022   Abscess of left groin 11/15/2021   Abnormal LFTs 11/15/2021   Acute respiratory disease due to COVID-19 virus 11/21/2020   Weakness    Hypoalbuminemia due to protein-calorie malnutrition [facility])    Neurogenic bowel    Neurogenic bladder    Labile blood pressure    Neuropathic pain    Abscess of female pelvis    SVT (supraventricular tachycardia) (HCC)    Radial styloid tenosynovitis 03/12/2018   Wheelchair dependence 02/27/2018   Localized osteoporosis with current pathological fracture with routine healing 01/19/2017   Wrist fracture 01/16/2017   Sprain of ankle 03/23/2016   Closed  fracture of lateral malleolus 03/16/2016   Health care maintenance 01/24/2016   Blood pressure elevated without history of HTN 10/25/2015   Essential hypertension 10/25/2015   Multiple sclerosis (HCC) 10/02/2015   Chronic left shoulder pain 07/19/2015   Multiple sclerosis exacerbation (HCC) 07/14/2015   MS (multiple sclerosis) (HCC) 11/26/2014   Increased body mass index 11/26/2014   HPV test positive 11/26/2014   Status post laparoscopic supracervical hysterectomy 11/26/2014   Galactorrhea 11/26/2014   Back ache 05/21/2014   Adiposity 05/21/2014   Disordered sleep 05/21/2014   Muscle spasticity 05/21/2014   Spasticity  05/21/2014   Calculus of kidney 12/09/2013   Renal colic 12/09/2013   Hypercholesteremia 08/19/2013   Hereditary and idiopathic neuropathy 08/19/2013   Hypercholesterolemia without hypertriglyceridemia 08/19/2013   Bladder infection, chronic 07/25/2012   Disorder of bladder function 07/25/2012   Incomplete bladder emptying 07/25/2012   Microscopic hematuria 07/25/2012   Right upper quadrant pain 07/25/2012   ONSET DATE: 5/24 [name] and broke R leg which caused beginning of ADL decline; diagnosed with MS in 1995)  REFERRING DIAG: MS  THERAPY DIAG:  Muscle weakness (generalized)  Other lack of coordination  Multiple sclerosis exacerbation (HCC)  Rationale for Evaluation and Treatment: Rehabilitation  SUBJECTIVE:  SUBJECTIVE STATEMENT: Pt requested to hold on a commode transfer today to save her legs for standing tasks planned with PT following OT visit today. Pt accompanied by: self  PERTINENT HISTORY: Pt reports increased difficulty with basic self care tasks since breaking her R leg last May.  Since that time, mobility and ADLs have declined, with pt requiring assist from private caregivers and family to manage bathing, toileting, dressing, and functional transfers.  PRECAUTIONS: Fall  WEIGHT BEARING RESTRICTIONS: No  PAIN: 10/18/23: No pain reported today Are you having pain? No; occasional pain in R lower back, but not today  FALLS: Has patient fallen in last 6 months? No Last fall was May 17 of 2024  LIVING ENVIRONMENT: Lives with: lives with their family (including mother who has dementia and sister [name])  Lives in: 2 level home but pt resides on main level Stairs: ramp  Has following equipment at home: Wheelchair (manual), Shower bench, and hand held shower shower, 1 grab bar in the shower, 3in1 commode, sit to stand lift (electric), FWW, hospital bed  PLOF: modified indep with ADL/IADLs prior to May of 0272   PATIENT GOALS: Increase independence with basic self  care tasks  OBJECTIVE:  Note: Objective measures were completed at Evaluation unless otherwise noted.  HAND DOMINANCE: Right  ADLs:  Overall ADLs: assist provided from paid caregiver and sister Transfers/ambulation related to ADLs: sit to stand lift for all transfers, set up for sliding board in/out of bed  Eating: indep  Grooming: modified indep (wc level in mom's bathroom)  UB Dressing: set up (can't access her bedroom closet)  LB Dressing: able to sit on side of bed to don all LB clothing; assist to hike pants/underwear Toileting: assist with clothing management d/t standing within electric lift Bathing: bed bath with sister helping to wash backside Tub Shower transfers: N/A; pt has walk in shower in mom's bathroom (wc does fit in this bathroom but pt reports inability to transfer from wc and requires arm rests on a bench for a successful transfer attempt from any DME).  Tub bench is in pt's bathroom, but lift does not fit through bathroom doorway and pt reports inability to transfer up from tub bench (currently unable to manage either transfer)  Equipment: see above  IADLs: Shopping: [employer] for shopping Light housekeeping: modified indep from wc level Meal Prep: modified indep from wc level Community mobility: relies on community transportation or family members Medication management: indep Landscape architect: indep Handwriting: NT; pt denies any FMC challenges  MOBILITY STATUS: Hx of falls  POSTURE COMMENTS:  Rounded shoulders, forward head, anterior pelvic tilt, and weight shift left   ACTIVITY TOLERANCE: Activity tolerance: Per PT; currently standing 30-60 sec within Light Gait harness/lift; currently non-ambulatory  UPPER EXTREMITY ROM:  BUEs WFL  UPPER EXTREMITY MMT:     MMT Right eval Left eval  Shoulder flexion 4+ 4  Shoulder abduction 4+ 4  Shoulder adduction    Shoulder extension    Shoulder internal rotation 4+ 4+  Shoulder external rotation 4 4   Middle trapezius    Lower trapezius    Elbow flexion 4+ 4+  Elbow extension 4+ 4+  Wrist flexion 4+ 4+  Wrist extension 4+ 4+  Wrist ulnar deviation    Wrist radial deviation    Wrist pronation    Wrist supination    (Blank rows = not tested)  HAND FUNCTION/COORDINATION:  R/L WNL; pt denies any coordination deficits/ able to manipulate pills/clothing fasteners/etc without difficulty  SENSATION: WFL  EDEMA: No visible edema in BUEs  MUSCLE TONE: BUEs WNL  COGNITION: Overall cognitive status: Within functional limits for tasks assessed  VISION: wears glasses all the time, no reports of diplopia  PERCEPTION: Not tested  PRAXIS: WFL  OBSERVATIONS:  Pt pleasant, cooperative, and motivated to work towards improving indep with ADLs.                                                                                                     TREATMENT DATE: 10/18/23 Self Care: -lateral scoot t/f wc<>mat without slide board; min A for blocking knees. -Simulated hiking/lowering waist of pants on EOB using looped gait belt; pt hiked and lowered belt x3 from knees>hips using lateral leans R/L and feet supported on floor.  Therapeutic Activity: -Core stability with ball toss; ball thrown to challenge reaching in all directions outside BOS. Completed seated on mat without UE support.   -Core stability and LE strengthening necessary for ADLs and functional mobility; Pt kicked ball while seated edge of mat; mat raised to allow better foot clearance for successful kicking for either foot.  PATIENT EDUCATION: Education details: Core stability exercises Person educated: Patient Education method: Explanation, demo, vc Education comprehension: verbalized understanding, demonstrated understanding, further training needed  HOME EXERCISE PROGRAM: IR A/AAROM to promote shoulder flexibility for peri care/bathing  GOALS: Goals reviewed with patient? Yes  SHORT TERM GOALS: Target date:  09/27/23  Pt will perform bed bath with set up.  Baseline: Eval: Min-mod A for posterior washing; 09/28/23: Min A for posterior washing  Goal status: in progress  2.  Pt will utilize sliding board for wc<>drop arm commode transfer with SBA. Baseline: Eval: Currently using electric lift for transfer to [facility]; 09/28/23: Not yet completed at home but transfers to commode have been easier without sliding board d/t  pt implementing a scoot pivot. Goal status: d/c (pt does not prefer sliding board for commode transfer)  3.  Pt will be indep to perform HEP for maintaining BUE strength for ADLs and functional transfers. Baseline: Eval: HEP not yet initiated; 09/28/23: Pt is working on core stability exercises from wc, cane stretches for bilat shoulder IR, and tricep extension via wc pushups Goal status: indep  LONG TERM GOALS: Target date: 11/08/23  Pt will perform sink bath with min A. Baseline: Eval: Currently bed bathing d/t inability to stand at sink without electric lift (lift does not fit into pt's bathroom); 09/28/23: still completing bed bath as pt is not yet able to stand  Goal status: ongoing  2.  Pt will perform wc<>tub bench transfer with min A. Baseline: Eval: Unable; 09/28/23: Performed in OT clinic with min A, but not yet in the home Goal status: ongoing  3.  Pt will perform squat pivot transfer wc<>BSC with SBA. Baseline: Eval: Eval: Currently using electric lift for transfer to [facility]; 09/28/23: Not yet completed in clinic with [facility], and using lift for BSC in the home still Goal status: ongoing  4. Pt will perform scoot transfer wc<>toilet using grab bars with supv to allow toileting in community setting.  Baseline: Eval: Pt limits community outings d/t requiring use of lift for [facility] transfers; 09/28/23: Pt has performed 2 successful scoot pivot  Transfers to toilet in OT clinic with min A, extra time.  Further trials with clothing management on toilet needed, but pt can lower and hike  pants  while sititng edge of mat via lateral leaning and min A to maintain core stability.  Goal status: ongoing  5.  Pt will tolerate standing x1 min with close supv and BUE support to manage clothing in prep for toileting. Baseline: Eval: Currently standing in electric lift only, or within parallel bars without lift; 09/28/23: Not yet attempted Goal status: in progress  ASSESSMENT: CLINICAL IMPRESSION: Pt able to tolerate ball toss/kick and clothing management simulation as noted above while seated on edge of mat table for increased challenge with core stability.  Frequent rest breaks needed, and though pt is improving with her confidence in WS in all directions, pt frequently requires BUE support to return to midline when reaching outside BOS during ball tosses in any direction.  Pt will continue to benefit from skilled OT to work towards above noted goals in OT poc, working to maximize indep with daily tasks while reducing burden of care on caregivers.   PERFORMANCE DEFICITS: in functional skills including ADLs, IADLs, strength, pain, flexibility, Gross motor control, mobility, balance, body mechanics, endurance, and decreased knowledge of use of DME, and psychosocial skills including coping strategies, environmental adaptation, habits, and routines and behaviors.   IMPAIRMENTS: are limiting patient from ADLs, IADLs, and social participation.   CO-MORBIDITIES: has co-morbidities such as neuropathy, back pain, obesity, MS, HTN that affects occupational performance. Patient will benefit from skilled OT to address above impairments and improve overall function.  MODIFICATION OR ASSISTANCE TO COMPLETE EVALUATION: No modification of tasks or assist necessary to complete an evaluation.  OT OCCUPATIONAL PROFILE AND HISTORY: Detailed assessment: Review of records and additional review of physical, cognitive, psychosocial history related to current functional performance.  CLINICAL DECISION MAKING: Moderate  - several treatment options, min-mod task modification necessary  REHAB POTENTIAL: Good  EVALUATION COMPLEXITY: Moderate    PLAN:  OT FREQUENCY: 2x/week  OT DURATION: 12 weeks  PLANNED INTERVENTIONS: 16109 OT Re-evaluation, 97535 self care/ADL  training, 14782 therapeutic exercise, 97530 therapeutic activity, 97112 neuromuscular re-education, 97140 manual therapy, 97116 gait training, 95621 moist heat, 97010 cryotherapy, balance training, functional mobility training, psychosocial skills training, energy conservation, coping strategies training, patient/family education, and DME and/or AE instructions  RECOMMENDED OTHER SERVICES: None at this time (Pt currently receiving PT services in this clinic)  CONSULTED AND AGREED WITH PLAN OF CARE: Patient  PLAN FOR NEXT SESSION: see above   Casandra Claw, OT 10/19/2023, 8:36 PM

## 2023-10-22 NOTE — Therapy (Signed)
 OUTPATIENT PHYSICAL THERAPY NEURO TREATMENT       Patient Name: Lisa Williams MRN: [id number] DOB:Sep 23, 1960, 63 y.o., female Today's Date: 10/23/2023   PCP: Overton Blotter  REFERRING PROVIDER: Overton Blotter  END OF SESSION:  PT End of Session - 10/23/23 1014     Visit Number 28    Number of Visits 47    Date for PT Re-Evaluation 12/27/23   corrected   PT Start Time 1015    PT Stop Time 1055    PT Time Calculation (min) 40 min    Equipment Utilized During Treatment Gait belt    Activity Tolerance Patient tolerated treatment well;No increased pain    Behavior During Therapy [facility] for tasks assessed/performed                                  Past Medical History:  Diagnosis Date   Abdominal pain, right upper quadrant    Back pain    Calculus of kidney 12/09/2013   Chronic back pain    unspecified   Chronic left shoulder pain 07/19/2015   Complication of anesthesia    Functional disorder of bladder    other   Galactorrhea 11/26/2014   Chronic    Hereditary and idiopathic neuropathy 08/19/2013   History of kidney stones    HPV test positive    Hypercholesteremia 08/19/2013   Hypertension    Incomplete bladder emptying    Microscopic hematuria    MS (multiple sclerosis) (HCC)    Muscle spasticity 05/21/2014   Nonspecific findings on examination of urine    other   Osteopenia    PONV (postoperative nausea and vomiting)    Status post laparoscopic supracervical hysterectomy 11/26/2014   Tobacco user 11/26/2014   Wrist fracture    Past Surgical History:  Procedure Laterality Date   bilateral tubal ligation  1996   BREAST CYST EXCISION Left 2002   CYST EXCISION Left 05/10/2022   Procedure: CYST REMOVAL;  Surgeon: Eldred Grego, MD;  Location: [facility] ORS;  Service: General;  Laterality: Left;   FRACTURE SURGERY     KNEE SURGERY     right   LAPAROSCOPIC SUPRACERVICAL HYSTERECTOMY  08/05/2013   ORIF WRIST FRACTURE  Left 01/17/2017   Procedure: OPEN REDUCTION INTERNAL FIXATION (ORIF) WRIST FRACTURE;  Surgeon: Jerlyn Moons, MD;  Location: [facility] ORS;  Service: Orthopedics;  Laterality: Left;   RADIOLOGY WITH ANESTHESIA N/A 03/18/2020   Procedure: MRI WITH ANESTHESIA CERVICAL SPINE AND BRAIN  WITH AND WITHOUT CONTRAST;  Surgeon: Radiologist, Medication, MD;  Location: [facility] OR;  Service: Radiology;  Laterality: N/A;   TUBAL LIGATION Bilateral    VAGINAL HYSTERECTOMY  03/2006   Patient Active Problem List   Diagnosis Date Noted   Sinus tachycardia 07/07/2023   Hypokalemia 07/29/2022   Weakness of both lower extremities 07/29/2022   Abscess of left groin 11/15/2021   Abnormal LFTs 11/15/2021   Acute respiratory disease due to COVID-19 virus 11/21/2020   Weakness    Hypoalbuminemia due to protein-calorie malnutrition [facility])    Neurogenic bowel    Neurogenic bladder    Labile blood pressure    Neuropathic pain    Abscess of female pelvis    SVT (supraventricular tachycardia) (HCC)    Radial styloid tenosynovitis 03/12/2018   Wheelchair dependence 02/27/2018   Localized osteoporosis with current pathological fracture with routine healing 01/19/2017   Wrist fracture 01/16/2017   Sprain of ankle  03/23/2016   Closed fracture of lateral malleolus 03/16/2016   Health care maintenance 01/24/2016   Blood pressure elevated without history of HTN 10/25/2015   Essential hypertension 10/25/2015   Multiple sclerosis (HCC) 10/02/2015   Chronic left shoulder pain 07/19/2015   Multiple sclerosis exacerbation (HCC) 07/14/2015   MS (multiple sclerosis) (HCC) 11/26/2014   Increased body mass index 11/26/2014   HPV test positive 11/26/2014   Status post laparoscopic supracervical hysterectomy 11/26/2014   Galactorrhea 11/26/2014   Back ache 05/21/2014   Adiposity 05/21/2014   Disordered sleep 05/21/2014   Muscle spasticity 05/21/2014   Spasticity 05/21/2014   Calculus of kidney 12/09/2013   Renal colic  12/09/2013   Hypercholesteremia 08/19/2013   Hereditary and idiopathic neuropathy 08/19/2013   Hypercholesterolemia without hypertriglyceridemia 08/19/2013   Bladder infection, chronic 07/25/2012   Disorder of bladder function 07/25/2012   Incomplete bladder emptying 07/25/2012   Microscopic hematuria 07/25/2012   Right upper quadrant pain 07/25/2012    ONSET DATE: 1995  REFERRING DIAG: MS  THERAPY DIAG:  Muscle weakness (generalized)  Multiple sclerosis exacerbation (HCC)  Other abnormalities of gait and mobility  Rationale for Evaluation and Treatment: Rehabilitation  SUBJECTIVE:                                                                                                                                                                                             SUBJECTIVE STATEMENT: Patient is on day 3 of new medicine for MS.   Pt accompanied by: self  PERTINENT HISTORY:  Patient is returning to PT s/p hospitalization.  s/p ORIF of R tibia shaft fracture 09/23/2022. Patient has weakness in BLE with RLE>LLE. She drives with hand controls. Patient has been diagnosed with MS in 1995. PMH includes: back pain, CBP, chronic L shoulder pain, galactorrhea, neuropathy, HPV, hypercholestermeia, HTN, MS, osteopenia, PONV, wrist fracture. Additional order for other closed fracture of proximal end of R tibia with routine healing. Still has to use [employer].   PAIN:  Are you having pain? Occasional pain in RLE; primarily in knee  PRECAUTIONS: Fall  RED FLAGS: None   WEIGHT BEARING RESTRICTIONS: No  FALLS: Has patient fallen in last 6 months? No  LIVING ENVIRONMENT: Lives with: lives with their family Lives in: House/apartment Stairs: ramp Has following equipment at home: Otho Blitz - 2 wheeled, Wheelchair (manual), shower chair, and Grab bars  PLOF: Independent with household mobility with device  PATIENT GOALS: to get her independence back. To be able to get into car, toilet,  and get dressed independently  OBJECTIVE:  Note: Objective measures were completed at Evaluation unless otherwise noted.  DIAGNOSTIC FINDINGS: MRI of the brain 03/18/2020 showed multiple T2/FLAIR hyperintense foci in the periventricular, juxtacortical and deep white matter.  There were no infratentorial lesions noted.  None of the foci enhanced.   Due to severe claustrophobia, the study was done with conscious sedation in the hospital   COGNITION: Overall cognitive status: Within functional limits for tasks assessed   SENSATION: Lack of sensation in feet, loss in bilateral lateral aspect of knee  COORDINATION: Does not have the strength for functional LE heel slide test     MUSCLE TONE: BLE mild tone    POSTURE: rounded shoulders, forward head, anterior pelvic tilt, and weight shift left    LOWER EXTREMITY MMT:    MMT Right Eval Left Eval  Hip flexion 0.9 1.5  Hip extension    Hip abduction 1.8 2.6  Hip adduction 3.1 1.9  Hip internal rotation    Hip external rotation    Knee flexion 0.9 1.5  Knee extension 0.8 1.2  Ankle dorsiflexion    Ankle plantarflexion    Ankle inversion    Ankle eversion    (Blank rows = not tested)  BED MOBILITY:  Assess in future session due to limited time  TRANSFERS: Assistive device utilized: Bariatric RW with sit to stands, slide board to table  Sit to stand: unable to reach full stand Stand to sit: unable to reach full stand  Chair to chair: slide board with CGA     GAIT: Unable to ambulate at this time.   FUNCTIONAL TESTS:  Sit to stand: tricep press with BUE; unable to bring arm to walker.    Function In Sitting Test (FIST)  (1/2 femur on surface; hips/knees flexed to 90deg)   - indicate bed or mat table / step stool if used  SCORING KEY: 4 = Independent (completes task independently & successfully) 3 = Verbal Cues/Increased Time (completes task independently & successfully and only needs more time/cues) 2 = Upper  Extremity Support (must use UE for support or assistance to complete successfully) 1 = Needs Assistance (unable to complete w/o physical assist; DOCUMENT LEVEL: min, mod, max) 0 = Dependent (requires complete physical assist; unable to complete successfully even w/ physical assist)  Randomly Administer Once Throughout Exam  4 - Anterior Nudge (superior sternum)  4 - Posterior Nudge (between scapular spines)  4 - Lateral Nudge (to dominant side at acromion)     4 - Static sitting (30 seconds)  4 - Sitting, shake 'no' (left and right)  4 - Sitting, eyes closed (30 seconds)   0 - Sitting, lift foot (dominant side, lift foot 1 inch twice)    2 - Pick up object from behind (object at midline, hands breadth posterior)  3 - Forward reach (use dominant arm, must complete full motion) 2 - Lateral reach (use dominant arm, clear opposite ischial tuberosity) 2 - Pick up object from floor (from between feet)   2 - Posterior scooting (move backwards 2 inches)  2 - Anterior scooting (move forward 2 inches)  2 - Lateral scooting (move to dominant side 2 inches)    TOTAL = 39/56  Notes/comments: slide board tranfer to/from table   MCD > 5 points MCID for IP REHAB > 6 points  Function In Sitting Test (FIST) 08/14/23 (1/2 femur on surface; hips/knees flexed to 90deg)   - indicate bed or mat table / step stool if used  SCORING KEY: 4 = Independent (completes task independently & successfully) 3 = Verbal Cues/Increased Time (completes  task independently & successfully and only needs more time/cues) 2 = Upper Extremity Support (must use UE for support or assistance to complete successfully) 1 = Needs Assistance (unable to complete w/o physical assist; DOCUMENT LEVEL: min, mod, max) 0 = Dependent (requires complete physical assist; unable to complete successfully even w/ physical assist)  Randomly Administer Once Throughout Exam  4 - Anterior Nudge (superior sternum)  4 - Posterior Nudge (between  scapular spines)  4 - Lateral Nudge (to dominant side at acromion)     4 - Static sitting (30 seconds)  4 - Sitting, shake 'no' (left and right)  4 - Sitting, eyes closed (30 seconds)   0 - Sitting, lift foot (dominant side, lift foot 1 inch twice)    4 - Pick up object from behind (object at midline, hands breadth posterior)  4 - Forward reach (use dominant arm, must complete full motion) 4 - Lateral reach (use dominant arm, clear opposite ischial tuberosity) 2 - Pick up object from floor (from between feet)   3 - Posterior scooting (move backwards 2 inches)  3 - Anterior scooting (move forward 2 inches)  3 - Lateral scooting (move to dominant side 2 inches)    TOTAL = 47/56    MCD > 5 points MCID for IP REHAB > 6 points                                                                                                                              TREATMENT DATE: 10/23/23  There.Act: In // bars: -sit to stand attempt x 4 trials  -5x partial sit to stand with focus on LE activation 2 sets   TherEx: Seated: Shoulder snow angels 15x Pectoral stretch/cross body adduction/abduction 15x Hamstring isometric press 10x each LE Windmill 10x each side Shoulder abduction 10x   Isometric: Hamstring curl 10x  Adduction 10x Abduction 10x Hip flexion 10x   PATIENT EDUCATION: Education details: goals, POC, HEP  Person educated: Patient Education method: Explanation, Demonstration, Tactile cues, Verbal cues, and Handouts Education comprehension: verbalized understanding, returned demonstration, verbal cues required, tactile cues required, and needs further education  HOME EXERCISE PROGRAM: Stand 3x/day in stander   GOALS: Goals reviewed with patient? Yes  SHORT TERM GOALS: Target date: 08/08/2023    Patient will be independent in home exercise program to improve strength/mobility for better functional independence with ADLs.  Baseline: 4/8: compliance Goal status: MET     LONG TERM GOALS: Target date: 12/27/2023   Patient will tolerate five consecutive stands with UE support from wheelchair to standing to improve functional mobility.  Baseline: unable to perform 4/8:unable to perform full stand 5/20: perform next session 5/29: two full stands with CGA and cushion on her seat Goal status: Ongoing   2.  Patient will ambulate 10 ft with bRW with wheelchair follow.  Baseline:  unable to ambulate 4/8: unable to ambulate 5/20: able to ambulate length of // bars in previous session  5/27: ambulate 2.5 lengths of // bars with two seated rest breaks with min A for weight shift and wheelchair follow  Goal status: partially met  3.  Patient will improve FIST score >6 points to demonstrate improved stability and ability to perform ADLs.  Baseline:  3/6: 39/56 4/8: 47/ 56 5/20: 48/56 Goal status: MET  4.  Patient will perform toileting with mod I at home for improved independence.  Baseline: 3/6: requires assistance 4/8: requires dependence on machine 5/20: requires assistance with use of wipe buddy.  Goal status: Partially Met    ASSESSMENT:  CLINICAL IMPRESSION: Patient has extensive trembling of her LE's with attempted standing. Is fatigued from new medication requiring regression of interventions this session. Patient is highly motivated throughout session despite her fatigue.   Patient will benefit from skilled physical therapy to improve mobility, stability, and independence.   OBJECTIVE IMPAIRMENTS: Abnormal gait, cardiopulmonary status limiting activity, decreased activity tolerance, decreased balance, decreased coordination, decreased endurance, decreased mobility, difficulty walking, decreased ROM, decreased strength, hypomobility, increased fascial restrictions, impaired perceived functional ability, impaired flexibility, impaired sensation, improper body mechanics, and postural dysfunction.   ACTIVITY LIMITATIONS: carrying, lifting, bending, sitting,  standing, squatting, sleeping, stairs, transfers, bed mobility, continence, bathing, toileting, dressing, self feeding, reach over head, hygiene/grooming, locomotion level, and caring for others  PARTICIPATION LIMITATIONS: meal prep, cleaning, laundry, interpersonal relationship, driving, shopping, community activity, and church  PERSONAL FACTORS: Age, Past/current experiences, Time since onset of injury/illness/exacerbation, Transportation, and 3+ comorbidities: ack pain, CBP, chronic L shoulder pain, galactorrhea, neuropathy, HPV, hypercholestermeia, HTN, MS, osteopenia, PONV, wrist fracture are also affecting patient's functional outcome.   REHAB POTENTIAL: Good  CLINICAL DECISION MAKING: Evolving/moderate complexity  EVALUATION COMPLEXITY: Moderate  PLAN:  PT FREQUENCY: 2x/week  PT DURATION: 12 weeks  PLANNED INTERVENTIONS: 97164- PT Re-evaluation, 97110-Therapeutic exercises, 97530- Therapeutic activity, 97112- Neuromuscular re-education, 97535- Self Care, 29528- Manual therapy, [phone]- Gait training, [phone]- Orthotic Fit/training, [phone]- Canalith repositioning, [id number]- Aquatic Therapy, [phone]- Electrical stimulation (unattended), [phone]- Electrical stimulation (manual), [id number]- Ultrasound, 95638- Traction (mechanical), Patient/Family education, Balance training, Stair training, Taping, Dry Needling, Joint mobilization, Joint manipulation, Spinal manipulation, Spinal mobilization, Scar mobilization, Compression bandaging, Vestibular training, Visual/preceptual remediation/compensation, Cognitive remediation, DME instructions, Cryotherapy, Moist heat, and Biofeedback  PLAN FOR NEXT SESSION: sit to stands; try supported walking    Lisa Williams  Brain Cahill, PT, DPT Physical Therapist - [facility]  Outpatient Physical Therapy- Main Campus [phone]    10/23/2023, 11:07 AM

## 2023-10-23 ENCOUNTER — Ambulatory Visit: Payer: PPO

## 2023-10-23 ENCOUNTER — Ambulatory Visit

## 2023-10-23 DIAGNOSIS — R2689 Other abnormalities of gait and mobility: Secondary | ICD-10-CM

## 2023-10-23 DIAGNOSIS — M6281 Muscle weakness (generalized): Secondary | ICD-10-CM

## 2023-10-23 DIAGNOSIS — G35 Multiple sclerosis: Secondary | ICD-10-CM

## 2023-10-24 NOTE — Therapy (Signed)
 OUTPATIENT OCCUPATIONAL THERAPY NEURO TREATMENT NOTE   Patient Name: Lisa Williams MRN: [id number] DOB:May 15, 1960, 63 y.o., female Today's Date: 10/24/2023  PCP: Dr. Overton Blotter   Neurologist Dr. Octavio Ben at [facility] REFERRING PROVIDER: Dr. Overton Blotter    END OF SESSION:  OT End of Session - 10/24/23 0725     Visit Number 15    Number of Visits 24    Date for OT Re-Evaluation 11/08/23    Authorization Time Period Reporting period beginning 09/28/23    Progress Note Due on Visit 10    OT Start Time 1100    OT Stop Time 1145    OT Time Calculation (min) 45 min    Equipment Utilized During Treatment manual wc    Activity Tolerance Patient tolerated treatment well    Behavior During Therapy [facility] for tasks assessed/performed         Past Medical History:  Diagnosis Date   Abdominal pain, right upper quadrant    Back pain    Calculus of kidney 12/09/2013   Chronic back pain    unspecified   Chronic left shoulder pain 07/19/2015   Complication of anesthesia    Functional disorder of bladder    other   Galactorrhea 11/26/2014   Chronic    Hereditary and idiopathic neuropathy 08/19/2013   History of kidney stones    HPV test positive    Hypercholesteremia 08/19/2013   Hypertension    Incomplete bladder emptying    Microscopic hematuria    MS (multiple sclerosis) (HCC)    Muscle spasticity 05/21/2014   Nonspecific findings on examination of urine    other   Osteopenia    PONV (postoperative nausea and vomiting)    Status post laparoscopic supracervical hysterectomy 11/26/2014   Tobacco user 11/26/2014   Wrist fracture    Past Surgical History:  Procedure Laterality Date   bilateral tubal ligation  1996   BREAST CYST EXCISION Left 2002   CYST EXCISION Left 05/10/2022   Procedure: CYST REMOVAL;  Surgeon: Eldred Grego, MD;  Location: [facility] ORS;  Service: General;  Laterality: Left;   FRACTURE SURGERY     KNEE SURGERY     right   LAPAROSCOPIC  SUPRACERVICAL HYSTERECTOMY  08/05/2013   ORIF WRIST FRACTURE Left 01/17/2017   Procedure: OPEN REDUCTION INTERNAL FIXATION (ORIF) WRIST FRACTURE;  Surgeon: Jerlyn Moons, MD;  Location: [facility] ORS;  Service: Orthopedics;  Laterality: Left;   RADIOLOGY WITH ANESTHESIA N/A 03/18/2020   Procedure: MRI WITH ANESTHESIA CERVICAL SPINE AND BRAIN  WITH AND WITHOUT CONTRAST;  Surgeon: Radiologist, Medication, MD;  Location: [facility] OR;  Service: Radiology;  Laterality: N/A;   TUBAL LIGATION Bilateral    VAGINAL HYSTERECTOMY  03/2006   Patient Active Problem List   Diagnosis Date Noted   Sinus tachycardia 07/07/2023   Hypokalemia 07/29/2022   Weakness of both lower extremities 07/29/2022   Abscess of left groin 11/15/2021   Abnormal LFTs 11/15/2021   Acute respiratory disease due to COVID-19 virus 11/21/2020   Weakness    Hypoalbuminemia due to protein-calorie malnutrition [facility])    Neurogenic bowel    Neurogenic bladder    Labile blood pressure    Neuropathic pain    Abscess of female pelvis    SVT (supraventricular tachycardia) (HCC)    Radial styloid tenosynovitis 03/12/2018   Wheelchair dependence 02/27/2018   Localized osteoporosis with current pathological fracture with routine healing 01/19/2017   Wrist fracture 01/16/2017   Sprain of ankle 03/23/2016   Closed  fracture of lateral malleolus 03/16/2016   Health care maintenance 01/24/2016   Blood pressure elevated without history of HTN 10/25/2015   Essential hypertension 10/25/2015   Multiple sclerosis (HCC) 10/02/2015   Chronic left shoulder pain 07/19/2015   Multiple sclerosis exacerbation (HCC) 07/14/2015   MS (multiple sclerosis) (HCC) 11/26/2014   Increased body mass index 11/26/2014   HPV test positive 11/26/2014   Status post laparoscopic supracervical hysterectomy 11/26/2014   Galactorrhea 11/26/2014   Back ache 05/21/2014   Adiposity 05/21/2014   Disordered sleep 05/21/2014   Muscle spasticity 05/21/2014   Spasticity  05/21/2014   Calculus of kidney 12/09/2013   Renal colic 12/09/2013   Hypercholesteremia 08/19/2013   Hereditary and idiopathic neuropathy 08/19/2013   Hypercholesterolemia without hypertriglyceridemia 08/19/2013   Bladder infection, chronic 07/25/2012   Disorder of bladder function 07/25/2012   Incomplete bladder emptying 07/25/2012   Microscopic hematuria 07/25/2012   Right upper quadrant pain 07/25/2012   ONSET DATE: 5/24 [name] and broke R leg which caused beginning of ADL decline; diagnosed with MS in 1995)  REFERRING DIAG: MS  THERAPY DIAG:  Muscle weakness (generalized)  Multiple sclerosis exacerbation (HCC)  Rationale for Evaluation and Treatment: Rehabilitation  SUBJECTIVE:  SUBJECTIVE STATEMENT: Pt reports increased weakness in her legs after first round of new med on Sunday. Pt accompanied by: self  PERTINENT HISTORY: Pt reports increased difficulty with basic self care tasks since breaking her R leg last May.  Since that time, mobility and ADLs have declined, with pt requiring assist from private caregivers and family to manage bathing, toileting, dressing, and functional transfers.  PRECAUTIONS: Fall  WEIGHT BEARING RESTRICTIONS: No  PAIN: 10/23/23: No pain reported today Are you having pain? No; occasional pain in R lower back, but not today  FALLS: Has patient fallen in last 6 months? No Last fall was May 17 of 2024  LIVING ENVIRONMENT: Lives with: lives with their family (including mother who has dementia and sister [name])  Lives in: 2 level home but pt resides on main level Stairs: ramp  Has following equipment at home: Wheelchair (manual), Shower bench, and hand held shower shower, 1 grab bar in the shower, 3in1 commode, sit to stand lift (electric), FWW, hospital bed  PLOF: modified indep with ADL/IADLs prior to May of 4166   PATIENT GOALS: Increase independence with basic self care tasks  OBJECTIVE:  Note: Objective measures were completed at  Evaluation unless otherwise noted.  HAND DOMINANCE: Right  ADLs:  Overall ADLs: assist provided from paid caregiver and sister Transfers/ambulation related to ADLs: sit to stand lift for all transfers, set up for sliding board in/out of bed  Eating: indep  Grooming: modified indep (wc level in mom's bathroom)  UB Dressing: set up (can't access her bedroom closet)  LB Dressing: able to sit on side of bed to don all LB clothing; assist to hike pants/underwear Toileting: assist with clothing management d/t standing within electric lift Bathing: bed bath with sister helping to wash backside Tub Shower transfers: N/A; pt has walk in shower in mom's bathroom (wc does fit in this bathroom but pt reports inability to transfer from wc and requires arm rests on a bench for a successful transfer attempt from any DME).  Tub bench is in pt's bathroom, but lift does not fit through bathroom doorway and pt reports inability to transfer up from tub bench (currently unable to manage either transfer)   Equipment: see above  IADLs: Shopping: [employer] for shopping Light housekeeping: modified  indep from wc level Meal Prep: modified indep from wc level Community mobility: relies on community transportation or family members Medication management: indep Landscape architect: indep Handwriting: NT; pt denies any [facility]MC challenges  MOBILITY STATUS: Hx of falls  POSTURE COMMENTS:  Rounded shoulders, forward head, anterior pelvic tilt, and weight shift left   ACTIVITY TOLERANCE: Activity tolerance: Per PT; currently standing 30-60 sec within Light Gait harness/lift; currently non-ambulatory  UPPER EXTREMITY ROM:  BUEs WFL  UPPER EXTREMITY MMT:     MMT Right eval Left eval  Shoulder flexion 4+ 4  Shoulder abduction 4+ 4  Shoulder adduction    Shoulder extension    Shoulder internal rotation 4+ 4+  Shoulder external rotation 4 4  Middle trapezius    Lower trapezius    Elbow flexion 4+ 4+  Elbow  extension 4+ 4+  Wrist flexion 4+ 4+  Wrist extension 4+ 4+  Wrist ulnar deviation    Wrist radial deviation    Wrist pronation    Wrist supination    (Blank rows = not tested)  HAND FUNCTION/COORDINATION:  R/L WNL; pt denies any coordination deficits/ able to manipulate pills/clothing fasteners/etc without difficulty  SENSATION: WFL  EDEMA: No visible edema in BUEs  MUSCLE TONE: BUEs WNL  COGNITION: Overall cognitive status: Within functional limits for tasks assessed  VISION: wears glasses all the time, no reports of diplopia  PERCEPTION: Not tested  PRAXIS: WFL  OBSERVATIONS:  Pt pleasant, cooperative, and motivated to work towards improving indep with ADLs.                                                                                                     TREATMENT DATE: 10/23/23 Self Care: -Simulated hiking/lowering waist of pants from wc level using looped gait belt; pt hiked and lowered belt x1 trial, unable to fully hike above iliac crest bilaterally.  Transitioned to pt positioned in front of mat table to practice clearing buttocks from wc with simultaneous anterior weight shift toward mat table to work towards clearance for hiking LB clothing from seated position. Pt required mod vc for successful anterior WS and foot position.  Therapeutic Activity: -Core stability: promoted forward and lateral (R/L) reaching outside BOS with return to midline without UE support.  Pt reached to place Connect 4 pieces into game board without wc arm rests in place when reaching to either side; OT provided close SBA to reduce fall risk.   PATIENT EDUCATION: Education details: Core stability exercises Person educated: Patient Education method: Explanation, demo, vc Education comprehension: verbalized understanding, demonstrated understanding, further training needed  HOME EXERCISE PROGRAM: IR A/AAROM to promote shoulder flexibility for peri care/bathing  GOALS: Goals reviewed  with patient? Yes  SHORT TERM GOALS: Target date: 09/27/23  Pt will perform bed bath with set up.  Baseline: Eval: Min-mod A for posterior washing; 09/28/23: Min A for posterior washing  Goal status: in progress  2.  Pt will utilize sliding board for wc<>drop arm commode transfer with SBA. Baseline: Eval: Currently using electric lift for transfer to [facility]; 09/28/23: Not yet completed at  home but transfers to commode have been easier without sliding board d/t pt implementing a scoot pivot. Goal status: d/c (pt does not prefer sliding board for commode transfer)  3.  Pt will be indep to perform HEP for maintaining BUE strength for ADLs and functional transfers. Baseline: Eval: HEP not yet initiated; 09/28/23: Pt is working on core stability exercises from wc, cane stretches for bilat shoulder IR, and tricep extension via wc pushups Goal status: indep  LONG TERM GOALS: Target date: 11/08/23  Pt will perform sink bath with min A. Baseline: Eval: Currently bed bathing d/t inability to stand at sink without electric lift (lift does not fit into pt's bathroom); 09/28/23: still completing bed bath as pt is not yet able to stand  Goal status: ongoing  2.  Pt will perform wc<>tub bench transfer with min A. Baseline: Eval: Unable; 09/28/23: Performed in OT clinic with min A, but not yet in the home Goal status: ongoing  3.  Pt will perform squat pivot transfer wc<>BSC with SBA. Baseline: Eval: Eval: Currently using electric lift for transfer to [facility]; 09/28/23: Not yet completed in clinic with [facility], and using lift for BSC in the home still Goal status: ongoing  4. Pt will perform scoot transfer wc<>toilet using grab bars with supv to allow toileting in community setting.  Baseline: Eval: Pt limits community outings d/t requiring use of lift for [facility] transfers; 09/28/23: Pt has performed 2 successful scoot pivot  Transfers to toilet in OT clinic with min A, extra time.  Further trials with clothing management  on toilet needed, but pt can lower and hike  pants while sititng edge of mat via lateral leaning and min A to maintain core stability.  Goal status: ongoing  5.  Pt will tolerate standing x1 min with close supv and BUE support to manage clothing in prep for toileting. Baseline: Eval: Currently standing in electric lift only, or within parallel bars without lift; 09/28/23: Not yet attempted Goal status: in progress  ASSESSMENT: CLINICAL IMPRESSION: Pt reporting increased leg weakness today after first round of new MS med.  Focus today on core stability and clothing hiking.  Pt had 3 minor LOB when reaching outside BOS out of 50+ reps total of reaching forward and laterally, with indep recovery using BUE support to return to midline sitting.  Pt able to hike gait belt to upper thighs, but unable to clear buttocks today in sitting.  Pt will continue to benefit from skilled OT to work towards above noted goals in OT poc, working to maximize indep with daily tasks while reducing burden of care on caregivers.   PERFORMANCE DEFICITS: in functional skills including ADLs, IADLs, strength, pain, flexibility, Gross motor control, mobility, balance, body mechanics, endurance, and decreased knowledge of use of DME, and psychosocial skills including coping strategies, environmental adaptation, habits, and routines and behaviors.   IMPAIRMENTS: are limiting patient from ADLs, IADLs, and social participation.   CO-MORBIDITIES: has co-morbidities such as neuropathy, back pain, obesity, MS, HTN that affects occupational performance. Patient will benefit from skilled OT to address above impairments and improve overall function.  MODIFICATION OR ASSISTANCE TO COMPLETE EVALUATION: No modification of tasks or assist necessary to complete an evaluation.  OT OCCUPATIONAL PROFILE AND HISTORY: Detailed assessment: Review of records and additional review of physical, cognitive, psychosocial history related to current  functional performance.  CLINICAL DECISION MAKING: Moderate - several treatment options, min-mod task modification necessary  REHAB POTENTIAL: Good  EVALUATION COMPLEXITY: Moderate  PLAN:  OT FREQUENCY: 2x/week  OT DURATION: 12 weeks  PLANNED INTERVENTIONS: 97168 OT Re-evaluation, 97535 self care/ADL training, 76160 therapeutic exercise, 97530 therapeutic activity, 97112 neuromuscular re-education, 97140 manual therapy, 97116 gait training, 73710 moist heat, 97010 cryotherapy, balance training, functional mobility training, psychosocial skills training, energy conservation, coping strategies training, patient/family education, and DME and/or AE instructions  RECOMMENDED OTHER SERVICES: None at this time (Pt currently receiving PT services in this clinic)  CONSULTED AND AGREED WITH PLAN OF CARE: Patient  PLAN FOR NEXT SESSION: see above   Casandra Claw, OT 10/24/2023, 7:27 AM

## 2023-10-24 NOTE — Therapy (Signed)
 OUTPATIENT PHYSICAL THERAPY NEURO TREATMENT       Patient Name: Lisa Williams MRN: [id number] DOB:11-06-1960, 63 y.o., female Today's Date: 10/25/2023   PCP: Overton Blotter  REFERRING PROVIDER: Overton Blotter  END OF SESSION:  PT End of Session - 10/25/23 1025     Visit Number 29    Number of Visits 47    Date for PT Re-Evaluation 12/27/23   corrected   PT Start Time 1102    PT Stop Time 1144    PT Time Calculation (min) 42 min    Equipment Utilized During Treatment Gait belt    Activity Tolerance Patient tolerated treatment well;No increased pain    Behavior During Therapy [facility] for tasks assessed/performed                                   Past Medical History:  Diagnosis Date   Abdominal pain, right upper quadrant    Back pain    Calculus of kidney 12/09/2013   Chronic back pain    unspecified   Chronic left shoulder pain 07/19/2015   Complication of anesthesia    Functional disorder of bladder    other   Galactorrhea 11/26/2014   Chronic    Hereditary and idiopathic neuropathy 08/19/2013   History of kidney stones    HPV test positive    Hypercholesteremia 08/19/2013   Hypertension    Incomplete bladder emptying    Microscopic hematuria    MS (multiple sclerosis) (HCC)    Muscle spasticity 05/21/2014   Nonspecific findings on examination of urine    other   Osteopenia    PONV (postoperative nausea and vomiting)    Status post laparoscopic supracervical hysterectomy 11/26/2014   Tobacco user 11/26/2014   Wrist fracture    Past Surgical History:  Procedure Laterality Date   bilateral tubal ligation  1996   BREAST CYST EXCISION Left 2002   CYST EXCISION Left 05/10/2022   Procedure: CYST REMOVAL;  Surgeon: Eldred Grego, MD;  Location: [facility] ORS;  Service: General;  Laterality: Left;   FRACTURE SURGERY     KNEE SURGERY     right   LAPAROSCOPIC SUPRACERVICAL HYSTERECTOMY  08/05/2013   ORIF WRIST  FRACTURE Left 01/17/2017   Procedure: OPEN REDUCTION INTERNAL FIXATION (ORIF) WRIST FRACTURE;  Surgeon: Jerlyn Moons, MD;  Location: [facility] ORS;  Service: Orthopedics;  Laterality: Left;   RADIOLOGY WITH ANESTHESIA N/A 03/18/2020   Procedure: MRI WITH ANESTHESIA CERVICAL SPINE AND BRAIN  WITH AND WITHOUT CONTRAST;  Surgeon: Radiologist, Medication, MD;  Location: [facility] OR;  Service: Radiology;  Laterality: N/A;   TUBAL LIGATION Bilateral    VAGINAL HYSTERECTOMY  03/2006   Patient Active Problem List   Diagnosis Date Noted   Sinus tachycardia 07/07/2023   Hypokalemia 07/29/2022   Weakness of both lower extremities 07/29/2022   Abscess of left groin 11/15/2021   Abnormal LFTs 11/15/2021   Acute respiratory disease due to COVID-19 virus 11/21/2020   Weakness    Hypoalbuminemia due to protein-calorie malnutrition [facility])    Neurogenic bowel    Neurogenic bladder    Labile blood pressure    Neuropathic pain    Abscess of female pelvis    SVT (supraventricular tachycardia) (HCC)    Radial styloid tenosynovitis 03/12/2018   Wheelchair dependence 02/27/2018   Localized osteoporosis with current pathological fracture with routine healing 01/19/2017   Wrist fracture 01/16/2017   Sprain of  ankle 03/23/2016   Closed fracture of lateral malleolus 03/16/2016   Health care maintenance 01/24/2016   Blood pressure elevated without history of HTN 10/25/2015   Essential hypertension 10/25/2015   Multiple sclerosis (HCC) 10/02/2015   Chronic left shoulder pain 07/19/2015   Multiple sclerosis exacerbation (HCC) 07/14/2015   MS (multiple sclerosis) (HCC) 11/26/2014   Increased body mass index 11/26/2014   HPV test positive 11/26/2014   Status post laparoscopic supracervical hysterectomy 11/26/2014   Galactorrhea 11/26/2014   Back ache 05/21/2014   Adiposity 05/21/2014   Disordered sleep 05/21/2014   Muscle spasticity 05/21/2014   Spasticity 05/21/2014   Calculus of kidney 12/09/2013   Renal colic  12/09/2013   Hypercholesteremia 08/19/2013   Hereditary and idiopathic neuropathy 08/19/2013   Hypercholesterolemia without hypertriglyceridemia 08/19/2013   Bladder infection, chronic 07/25/2012   Disorder of bladder function 07/25/2012   Incomplete bladder emptying 07/25/2012   Microscopic hematuria 07/25/2012   Right upper quadrant pain 07/25/2012    ONSET DATE: 1995  REFERRING DIAG: MS  THERAPY DIAG:  Muscle weakness (generalized)  Multiple sclerosis exacerbation (HCC)  Other abnormalities of gait and mobility  Other lack of coordination  Rationale for Evaluation and Treatment: Rehabilitation  SUBJECTIVE:                                                                                                                                                                                             SUBJECTIVE STATEMENT: Patient reports she was able to clear her bottom while at OT earlier today. Still feeling weak.   Pt accompanied by: self  PERTINENT HISTORY:  Patient is returning to PT s/p hospitalization.  s/p ORIF of R tibia shaft fracture 09/23/2022. Patient has weakness in BLE with RLE>LLE. She drives with hand controls. Patient has been diagnosed with MS in 1995. PMH includes: back pain, CBP, chronic L shoulder pain, galactorrhea, neuropathy, HPV, hypercholestermeia, HTN, MS, osteopenia, PONV, wrist fracture. Additional order for other closed fracture of proximal end of R tibia with routine healing. Still has to use [employer].   PAIN:  Are you having pain? Occasional pain in RLE; primarily in knee  PRECAUTIONS: Fall  RED FLAGS: None   WEIGHT BEARING RESTRICTIONS: No  FALLS: Has patient fallen in last 6 months? No  LIVING ENVIRONMENT: Lives with: lives with their family Lives in: House/apartment Stairs: ramp Has following equipment at home: Otho Blitz - 2 wheeled, Wheelchair (manual), shower chair, and Grab bars  PLOF: Independent with household mobility with  device  PATIENT GOALS: to get her independence back. To be able to get into car, toilet, and get dressed independently  OBJECTIVE:  Note: Objective measures were completed at Evaluation unless otherwise noted.  DIAGNOSTIC FINDINGS: MRI of the brain 03/18/2020 showed multiple T2/FLAIR hyperintense foci in the periventricular, juxtacortical and deep white matter.  There were no infratentorial lesions noted.  None of the foci enhanced.   Due to severe claustrophobia, the study was done with conscious sedation in the hospital   COGNITION: Overall cognitive status: Within functional limits for tasks assessed   SENSATION: Lack of sensation in feet, loss in bilateral lateral aspect of knee  COORDINATION: Does not have the strength for functional LE heel slide test     MUSCLE TONE: BLE mild tone    POSTURE: rounded shoulders, forward head, anterior pelvic tilt, and weight shift left    LOWER EXTREMITY MMT:    MMT Right Eval Left Eval  Hip flexion 0.9 1.5  Hip extension    Hip abduction 1.8 2.6  Hip adduction 3.1 1.9  Hip internal rotation    Hip external rotation    Knee flexion 0.9 1.5  Knee extension 0.8 1.2  Ankle dorsiflexion    Ankle plantarflexion    Ankle inversion    Ankle eversion    (Blank rows = not tested)  BED MOBILITY:  Assess in future session due to limited time  TRANSFERS: Assistive device utilized: Bariatric RW with sit to stands, slide board to table  Sit to stand: unable to reach full stand Stand to sit: unable to reach full stand  Chair to chair: slide board with CGA     GAIT: Unable to ambulate at this time.   FUNCTIONAL TESTS:  Sit to stand: tricep press with BUE; unable to bring arm to walker.    Function In Sitting Test (FIST)  (1/2 femur on surface; hips/knees flexed to 90deg)   - indicate bed or mat table / step stool if used  SCORING KEY: 4 = Independent (completes task independently & successfully) 3 = Verbal Cues/Increased  Time (completes task independently & successfully and only needs more time/cues) 2 = Upper Extremity Support (must use UE for support or assistance to complete successfully) 1 = Needs Assistance (unable to complete w/o physical assist; DOCUMENT LEVEL: min, mod, max) 0 = Dependent (requires complete physical assist; unable to complete successfully even w/ physical assist)  Randomly Administer Once Throughout Exam  4 - Anterior Nudge (superior sternum)  4 - Posterior Nudge (between scapular spines)  4 - Lateral Nudge (to dominant side at acromion)     4 - Static sitting (30 seconds)  4 - Sitting, shake 'no' (left and right)  4 - Sitting, eyes closed (30 seconds)   0 - Sitting, lift foot (dominant side, lift foot 1 inch twice)    2 - Pick up object from behind (object at midline, hands breadth posterior)  3 - Forward reach (use dominant arm, must complete full motion) 2 - Lateral reach (use dominant arm, clear opposite ischial tuberosity) 2 - Pick up object from floor (from between feet)   2 - Posterior scooting (move backwards 2 inches)  2 - Anterior scooting (move forward 2 inches)  2 - Lateral scooting (move to dominant side 2 inches)    TOTAL = 39/56  Notes/comments: slide board tranfer to/from table   MCD > 5 points MCID for IP REHAB > 6 points  Function In Sitting Test (FIST) 08/14/23 (1/2 femur on surface; hips/knees flexed to 90deg)   - indicate bed or mat table / step stool if used  SCORING KEY: 4 =  Independent (completes task independently & successfully) 3 = Verbal Cues/Increased Time (completes task independently & successfully and only needs more time/cues) 2 = Upper Extremity Support (must use UE for support or assistance to complete successfully) 1 = Needs Assistance (unable to complete w/o physical assist; DOCUMENT LEVEL: min, mod, max) 0 = Dependent (requires complete physical assist; unable to complete successfully even w/ physical assist)  Randomly Administer  Once Throughout Exam  4 - Anterior Nudge (superior sternum)  4 - Posterior Nudge (between scapular spines)  4 - Lateral Nudge (to dominant side at acromion)     4 - Static sitting (30 seconds)  4 - Sitting, shake 'no' (left and right)  4 - Sitting, eyes closed (30 seconds)   0 - Sitting, lift foot (dominant side, lift foot 1 inch twice)    4 - Pick up object from behind (object at midline, hands breadth posterior)  4 - Forward reach (use dominant arm, must complete full motion) 4 - Lateral reach (use dominant arm, clear opposite ischial tuberosity) 2 - Pick up object from floor (from between feet)   3 - Posterior scooting (move backwards 2 inches)  3 - Anterior scooting (move forward 2 inches)  3 - Lateral scooting (move to dominant side 2 inches)    TOTAL = 47/56    MCD > 5 points MCID for IP REHAB > 6 points                                                                                                                              TREATMENT DATE: 10/25/23  There.Act: In // bars: -sit to stand attempt x 3 trials ; with one successful full stand   TherEx: Isometric: Hamstring curl 10x  Adduction 10x Abduction 10x Hip flexion 10x   Seated on dynadisc: -static sit 30 seconds x 3 trials -medial/lateral weight shift 10x -anterior/posterior weight shift 10x - hand clap for coordination and spatial awareness   Large blue ball: -hamstring curl 15x each LE -leg press 10x each LE  PATIENT EDUCATION: Education details: goals, POC, HEP  Person educated: Patient Education method: Explanation, Demonstration, Tactile cues, Verbal cues, and Handouts Education comprehension: verbalized understanding, returned demonstration, verbal cues required, tactile cues required, and needs further education  HOME EXERCISE PROGRAM: Stand 3x/day in stander   GOALS: Goals reviewed with patient? Yes  SHORT TERM GOALS: Target date: 08/08/2023    Patient will be independent in home  exercise program to improve strength/mobility for better functional independence with ADLs.  Baseline: 4/8: compliance Goal status: MET    LONG TERM GOALS: Target date: 12/27/2023   Patient will tolerate five consecutive stands with UE support from wheelchair to standing to improve functional mobility.  Baseline: unable to perform 4/8:unable to perform full stand 5/20: perform next session 5/29: two full stands with CGA and cushion on her seat Goal status: Ongoing   2.  Patient will ambulate 10 ft with bRW with wheelchair  follow.  Baseline:  unable to ambulate 4/8: unable to ambulate 5/20: able to ambulate length of // bars in previous session 5/27: ambulate 2.5 lengths of // bars with two seated rest breaks with min A for weight shift and wheelchair follow  Goal status: partially met  3.  Patient will improve FIST score >6 points to demonstrate improved stability and ability to perform ADLs.  Baseline:  3/6: 39/56 4/8: 47/ 56 5/20: 48/56 Goal status: MET  4.  Patient will perform toileting with mod I at home for improved independence.  Baseline: 3/6: requires assistance 4/8: requires dependence on machine 5/20: requires assistance with use of wipe buddy.  Goal status: Partially Met    ASSESSMENT:  CLINICAL IMPRESSION: Patient is able to perform one successful stand in the // bars this session but continues to have difficulty with fatigue this week. Patient tolerates core and LE strengthening interventions in seated with rest breaks.    Patient will benefit from skilled physical therapy to improve mobility, stability, and independence.   OBJECTIVE IMPAIRMENTS: Abnormal gait, cardiopulmonary status limiting activity, decreased activity tolerance, decreased balance, decreased coordination, decreased endurance, decreased mobility, difficulty walking, decreased ROM, decreased strength, hypomobility, increased fascial restrictions, impaired perceived functional ability, impaired flexibility,  impaired sensation, improper body mechanics, and postural dysfunction.   ACTIVITY LIMITATIONS: carrying, lifting, bending, sitting, standing, squatting, sleeping, stairs, transfers, bed mobility, continence, bathing, toileting, dressing, self feeding, reach over head, hygiene/grooming, locomotion level, and caring for others  PARTICIPATION LIMITATIONS: meal prep, cleaning, laundry, interpersonal relationship, driving, shopping, community activity, and church  PERSONAL FACTORS: Age, Past/current experiences, Time since onset of injury/illness/exacerbation, Transportation, and 3+ comorbidities: ack pain, CBP, chronic L shoulder pain, galactorrhea, neuropathy, HPV, hypercholestermeia, HTN, MS, osteopenia, PONV, wrist fracture are also affecting patient's functional outcome.   REHAB POTENTIAL: Good  CLINICAL DECISION MAKING: Evolving/moderate complexity  EVALUATION COMPLEXITY: Moderate  PLAN:  PT FREQUENCY: 2x/week  PT DURATION: 12 weeks  PLANNED INTERVENTIONS: 97164- PT Re-evaluation, 97110-Therapeutic exercises, 97530- Therapeutic activity, 97112- Neuromuscular re-education, 97535- Self Care, 91478- Manual therapy, [phone]- Gait training, [phone]- Orthotic Fit/training, [phone]- Canalith repositioning, [id number]- Aquatic Therapy, [phone]- Electrical stimulation (unattended), [phone]- Electrical stimulation (manual), [id number]- Ultrasound, 24401- Traction (mechanical), Patient/Family education, Balance training, Stair training, Taping, Dry Needling, Joint mobilization, Joint manipulation, Spinal manipulation, Spinal mobilization, Scar mobilization, Compression bandaging, Vestibular training, Visual/preceptual remediation/compensation, Cognitive remediation, DME instructions, Cryotherapy, Moist heat, and Biofeedback  PLAN FOR NEXT SESSION: sit to stands; try supported walking    Lisa Williams  Brain Cahill, PT, DPT Physical Therapist - [facility]  Outpatient Physical Therapy- Main  Campus [phone]    10/25/2023, 11:56 AM

## 2023-10-25 ENCOUNTER — Ambulatory Visit

## 2023-10-25 ENCOUNTER — Ambulatory Visit: Payer: PPO

## 2023-10-25 DIAGNOSIS — G35 Multiple sclerosis: Secondary | ICD-10-CM

## 2023-10-25 DIAGNOSIS — R278 Other lack of coordination: Secondary | ICD-10-CM

## 2023-10-25 DIAGNOSIS — M6281 Muscle weakness (generalized): Secondary | ICD-10-CM | POA: Diagnosis not present

## 2023-10-25 DIAGNOSIS — R2689 Other abnormalities of gait and mobility: Secondary | ICD-10-CM

## 2023-10-28 NOTE — Therapy (Signed)
 OUTPATIENT OCCUPATIONAL THERAPY NEURO TREATMENT NOTE   Patient Name: Lisa Williams MRN: [id number] DOB:[birth year], 63 y.o., female Today's Date: 10/28/2023  PCP: Dr. Layman Piety   Neurologist Dr. Suanne at [facility] REFERRING PROVIDER: Dr. Layman Piety    END OF SESSION:  OT End of Session - 10/28/23 2208     Visit Number 16    Number of Visits 24    Date for OT Re-Evaluation 11/08/23    Authorization Time Period Reporting period beginning 09/28/23    Progress Note Due on Visit 20    OT Start Time 1015    OT Stop Time 1100    OT Time Calculation (min) 45 min    Equipment Utilized During Treatment manual wc    Activity Tolerance Patient tolerated treatment well    Behavior During Therapy [facility] for tasks assessed/performed         Past Medical History:  Diagnosis Date   Abdominal pain, right upper quadrant    Back pain    Calculus of kidney 12/09/2013   Chronic back pain    unspecified   Chronic left shoulder pain 07/19/2015   Complication of anesthesia    Functional disorder of bladder    other   Galactorrhea 11/26/2014   Chronic    Hereditary and idiopathic neuropathy 08/19/2013   History of kidney stones    HPV test positive    Hypercholesteremia 08/19/2013   Hypertension    Incomplete bladder emptying    Microscopic hematuria    MS (multiple sclerosis) (HCC)    Muscle spasticity 05/21/2014   Nonspecific findings on examination of urine    other   Osteopenia    PONV (postoperative nausea and vomiting)    Status post laparoscopic supracervical hysterectomy 11/26/2014   Tobacco user 11/26/2014   Wrist fracture    Past Surgical History:  Procedure Laterality Date   bilateral tubal ligation  1996   BREAST CYST EXCISION Left 2002   CYST EXCISION Left 05/10/2022   Procedure: CYST REMOVAL;  Surgeon: Rodolph Romano, MD;  Location: [facility] ORS;  Service: General;  Laterality: Left;   FRACTURE SURGERY     KNEE SURGERY     right   LAPAROSCOPIC  SUPRACERVICAL HYSTERECTOMY  08/05/2013   ORIF WRIST FRACTURE Left 01/17/2017   Procedure: OPEN REDUCTION INTERNAL FIXATION (ORIF) WRIST FRACTURE;  Surgeon: Leora Lynwood SAUNDERS, MD;  Location: [facility] ORS;  Service: Orthopedics;  Laterality: Left;   RADIOLOGY WITH ANESTHESIA N/A 03/18/2020   Procedure: MRI WITH ANESTHESIA CERVICAL SPINE AND BRAIN  WITH AND WITHOUT CONTRAST;  Surgeon: Radiologist, Medication, MD;  Location: [facility] OR;  Service: Radiology;  Laterality: N/A;   TUBAL LIGATION Bilateral    VAGINAL HYSTERECTOMY  03/2006   Patient Active Problem List   Diagnosis Date Noted   Sinus tachycardia 07/07/2023   Hypokalemia 07/29/2022   Weakness of both lower extremities 07/29/2022   Abscess of left groin 11/15/2021   Abnormal LFTs 11/15/2021   Acute respiratory disease due to COVID-19 virus 11/21/2020   Weakness    Hypoalbuminemia due to protein-calorie malnutrition [facility])    Neurogenic bowel    Neurogenic bladder    Labile blood pressure    Neuropathic pain    Abscess of female pelvis    SVT (supraventricular tachycardia) (HCC)    Radial styloid tenosynovitis 03/12/2018   Wheelchair dependence 02/27/2018   Localized osteoporosis with current pathological fracture with routine healing 01/19/2017   Wrist fracture 01/16/2017   Sprain of ankle 03/23/2016   Closed  fracture of lateral malleolus 03/16/2016   Health care maintenance 01/24/2016   Blood pressure elevated without history of HTN 10/25/2015   Essential hypertension 10/25/2015   Multiple sclerosis (HCC) 10/02/2015   Chronic left shoulder pain 07/19/2015   Multiple sclerosis exacerbation (HCC) 07/14/2015   MS (multiple sclerosis) (HCC) 11/26/2014   Increased body mass index 11/26/2014   HPV test positive 11/26/2014   Status post laparoscopic supracervical hysterectomy 11/26/2014   Galactorrhea 11/26/2014   Back ache 05/21/2014   Adiposity 05/21/2014   Disordered sleep 05/21/2014   Muscle spasticity 05/21/2014   Spasticity  05/21/2014   Calculus of kidney 12/09/2013   Renal colic 12/09/2013   Hypercholesteremia 08/19/2013   Hereditary and idiopathic neuropathy 08/19/2013   Hypercholesterolemia without hypertriglyceridemia 08/19/2013   Bladder infection, chronic 07/25/2012   Disorder of bladder function 07/25/2012   Incomplete bladder emptying 07/25/2012   Microscopic hematuria 07/25/2012   Right upper quadrant pain 07/25/2012   ONSET DATE: 5/24 Amon and broke R leg which caused beginning of ADL decline; diagnosed with MS in 1995)  REFERRING DIAG: MS  THERAPY DIAG:  Muscle weakness (generalized)  Other lack of coordination  Multiple sclerosis exacerbation (HCC)  Rationale for Evaluation and Treatment: Rehabilitation  SUBJECTIVE:  SUBJECTIVE STATEMENT: Pt reports BLEs are feeling less weak today.   Pt accompanied by: self  PERTINENT HISTORY: Pt reports increased difficulty with basic self care tasks since breaking her R leg last May.  Since that time, mobility and ADLs have declined, with pt requiring assist from private caregivers and family to manage bathing, toileting, dressing, and functional transfers.  PRECAUTIONS: Fall  WEIGHT BEARING RESTRICTIONS: No  PAIN: 10/25/23: No pain reported today Are you having pain? No; occasional pain in R lower back, but not today  FALLS: Has patient fallen in last 6 months? No Last fall was May 17 of 2024  LIVING ENVIRONMENT: Lives with: lives with their family (including mother who has dementia and sister [name])  Lives in: 2 level home but pt resides on main level Stairs: ramp  Has following equipment at home: Wheelchair (manual), Shower bench, and hand held shower shower, 1 grab bar in the shower, 3in1 commode, sit to stand lift (electric), FWW, hospital bed  PLOF: modified indep with ADL/IADLs prior to May of 7975   PATIENT GOALS: Increase independence with basic self care tasks  OBJECTIVE:  Note: Objective measures were completed at Evaluation  unless otherwise noted.  HAND DOMINANCE: Right  ADLs:  Overall ADLs: assist provided from paid caregiver and sister Transfers/ambulation related to ADLs: sit to stand lift for all transfers, set up for sliding board in/out of bed  Eating: indep  Grooming: modified indep (wc level in mom's bathroom)  UB Dressing: set up (can't access her bedroom closet)  LB Dressing: able to sit on side of bed to don all LB clothing; assist to hike pants/underwear Toileting: assist with clothing management d/t standing within electric lift Bathing: bed bath with sister helping to wash backside Tub Shower transfers: N/A; pt has walk in shower in mom's bathroom (wc does fit in this bathroom but pt reports inability to transfer from wc and requires arm rests on a bench for a successful transfer attempt from any DME).  Tub bench is in pt's bathroom, but lift does not fit through bathroom doorway and pt reports inability to transfer up from tub bench (currently unable to manage either transfer)   Equipment: see above  IADLs: Shopping: [employer] for shopping Light housekeeping: modified  indep from wc level Meal Prep: modified indep from wc level Community mobility: relies on community transportation or family members Medication management: indep Landscape architect: indep Handwriting: NT; pt denies any FMC challenges  MOBILITY STATUS: Hx of falls  POSTURE COMMENTS:  Rounded shoulders, forward head, anterior pelvic tilt, and weight shift left   ACTIVITY TOLERANCE: Activity tolerance: Per PT; currently standing 30-60 sec within Light Gait harness/lift; currently non-ambulatory  UPPER EXTREMITY ROM:  BUEs WFL  UPPER EXTREMITY MMT:     MMT Right eval Left eval  Shoulder flexion 4+ 4  Shoulder abduction 4+ 4  Shoulder adduction    Shoulder extension    Shoulder internal rotation 4+ 4+  Shoulder external rotation 4 4  Middle trapezius    Lower trapezius    Elbow flexion 4+ 4+  Elbow extension  4+ 4+  Wrist flexion 4+ 4+  Wrist extension 4+ 4+  Wrist ulnar deviation    Wrist radial deviation    Wrist pronation    Wrist supination    (Blank rows = not tested)  HAND FUNCTION/COORDINATION:  R/L WNL; pt denies any coordination deficits/ able to manipulate pills/clothing fasteners/etc without difficulty  SENSATION: WFL  EDEMA: No visible edema in BUEs  MUSCLE TONE: BUEs WNL  COGNITION: Overall cognitive status: Within functional limits for tasks assessed  VISION: wears glasses all the time, no reports of diplopia  PERCEPTION: Not tested  PRAXIS: WFL  OBSERVATIONS:  Pt pleasant, cooperative, and motivated to work towards improving indep with ADLs.                                                                                                     TREATMENT DATE: 10/25/23 Self Care: -Scoot pivot transfer wc<>toilet; successful on 2nd attempt with CGA to and from commode  Therapeutic Activity: -Core stability: promoted forward and lateral (R/L) reaching outside BOS with return to midline without UE support (wc arm rests off) with participation in ball toss/catch/bounce.    PATIENT EDUCATION: Education details: Core stability exercises Person educated: Patient Education method: Explanation, demo, vc Education comprehension: verbalized understanding, demonstrated understanding, further training needed  HOME EXERCISE PROGRAM: IR A/AAROM to promote shoulder flexibility for peri care/bathing  GOALS: Goals reviewed with patient? Yes  SHORT TERM GOALS: Target date: 09/27/23  Pt will perform bed bath with set up.  Baseline: Eval: Min-mod A for posterior washing; 09/28/23: Min A for posterior washing  Goal status: in progress  2.  Pt will utilize sliding board for wc<>drop arm commode transfer with SBA. Baseline: Eval: Currently using electric lift for transfer to [facility]; 09/28/23: Not yet completed at home but transfers to commode have been easier without sliding board d/t  pt implementing a scoot pivot. Goal status: d/c (pt does not prefer sliding board for commode transfer)  3.  Pt will be indep to perform HEP for maintaining BUE strength for ADLs and functional transfers. Baseline: Eval: HEP not yet initiated; 09/28/23: Pt is working on core stability exercises from wc, cane stretches for bilat shoulder IR, and tricep extension via wc pushups Goal status: indep  LONG TERM GOALS: Target date: 11/08/23  Pt will perform sink bath with min A. Baseline: Eval: Currently bed bathing d/t inability to stand at sink without electric lift (lift does not fit into pt's bathroom); 09/28/23: still completing bed bath as pt is not yet able to stand  Goal status: ongoing  2.  Pt will perform wc<>tub bench transfer with min A. Baseline: Eval: Unable; 09/28/23: Performed in OT clinic with min A, but not yet in the home Goal status: ongoing  3.  Pt will perform squat pivot transfer wc<>BSC with SBA. Baseline: Eval: Eval: Currently using electric lift for transfer to [facility]; 09/28/23: Not yet completed in clinic with [facility], and using lift for BSC in the home still Goal status: ongoing  4. Pt will perform scoot transfer wc<>toilet using grab bars with supv to allow toileting in community setting.  Baseline: Eval: Pt limits community outings d/t requiring use of lift for [facility] transfers; 09/28/23: Pt has performed 2 successful scoot pivot  Transfers to toilet in OT clinic with min A, extra time.  Further trials with clothing management on toilet needed, but pt can lower and hike  pants while sititng edge of mat via lateral leaning and min A to maintain core stability.  Goal status: ongoing  5.  Pt will tolerate standing x1 min with close supv and BUE support to manage clothing in prep for toileting. Baseline: Eval: Currently standing in electric lift only, or within parallel bars without lift; 09/28/23: Not yet attempted Goal status: in progress  ASSESSMENT: CLINICAL IMPRESSION: Pt  continues to improve with scoot pivot transfer wc<>toilet.  Pt successful on 2nd trial today, requiring only CGA, min vc for centering self on toilet seat to reduce fall risk, and increased time/rest between transfers on/off commode.  Pt will continue to benefit from skilled OT to work towards above noted goals in OT poc, working to maximize indep with daily tasks while reducing burden of care on caregivers.   PERFORMANCE DEFICITS: in functional skills including ADLs, IADLs, strength, pain, flexibility, Gross motor control, mobility, balance, body mechanics, endurance, and decreased knowledge of use of DME, and psychosocial skills including coping strategies, environmental adaptation, habits, and routines and behaviors.   IMPAIRMENTS: are limiting patient from ADLs, IADLs, and social participation.   CO-MORBIDITIES: has co-morbidities such as neuropathy, back pain, obesity, MS, HTN that affects occupational performance. Patient will benefit from skilled OT to address above impairments and improve overall function.  MODIFICATION OR ASSISTANCE TO COMPLETE EVALUATION: No modification of tasks or assist necessary to complete an evaluation.  OT OCCUPATIONAL PROFILE AND HISTORY: Detailed assessment: Review of records and additional review of physical, cognitive, psychosocial history related to current functional performance.  CLINICAL DECISION MAKING: Moderate - several treatment options, min-mod task modification necessary  REHAB POTENTIAL: Good  EVALUATION COMPLEXITY: Moderate    PLAN:  OT FREQUENCY: 2x/week  OT DURATION: 12 weeks  PLANNED INTERVENTIONS: 97168 OT Re-evaluation, 97535 self care/ADL training, 02889 therapeutic exercise, 97530 therapeutic activity, 97112 neuromuscular re-education, 97140 manual therapy, 97116 gait training, 02989 moist heat, 97010 cryotherapy, balance training, functional mobility training, psychosocial skills training, energy conservation, coping strategies  training, patient/family education, and DME and/or AE instructions  RECOMMENDED OTHER SERVICES: None at this time (Pt currently receiving PT services in this clinic)  CONSULTED AND AGREED WITH PLAN OF CARE: Patient  PLAN FOR NEXT SESSION: see above   Inocente MARLA Blazing, OT 10/28/2023, 10:10 PM

## 2023-10-29 NOTE — Therapy (Signed)
 OUTPATIENT PHYSICAL THERAPY NEURO TREATMENT/ Physical Therapy Progress Note   Dates of reporting period  09/25/23   to   10/30/23        Patient Name: Lisa Williams MRN: [id number] DOB:June 09, 1960, 63 y.o., female Today's Date: 10/30/2023   PCP: Lenon Na  REFERRING PROVIDER: Lenon Na  END OF SESSION:  PT End of Session - 10/30/23 1014     Visit Number 30    Number of Visits 47    Date for PT Re-Evaluation 12/27/23   corrected   PT Start Time 1014    PT Stop Time 1055    PT Time Calculation (min) 41 min    Equipment Utilized During Treatment Gait belt    Activity Tolerance Patient tolerated treatment well;No increased pain    Behavior During Therapy [facility] for tasks assessed/performed                                    Past Medical History:  Diagnosis Date   Abdominal pain, right upper quadrant    Back pain    Calculus of kidney 12/09/2013   Chronic back pain    unspecified   Chronic left shoulder pain 07/19/2015   Complication of anesthesia    Functional disorder of bladder    other   Galactorrhea 11/26/2014   Chronic    Hereditary and idiopathic neuropathy 08/19/2013   History of kidney stones    HPV test positive    Hypercholesteremia 08/19/2013   Hypertension    Incomplete bladder emptying    Microscopic hematuria    MS (multiple sclerosis) (HCC)    Muscle spasticity 05/21/2014   Nonspecific findings on examination of urine    other   Osteopenia    PONV (postoperative nausea and vomiting)    Status post laparoscopic supracervical hysterectomy 11/26/2014   Tobacco user 11/26/2014   Wrist fracture    Past Surgical History:  Procedure Laterality Date   bilateral tubal ligation  1996   BREAST CYST EXCISION Left 2002   CYST EXCISION Left 05/10/2022   Procedure: CYST REMOVAL;  Surgeon: Rodolph Romano, MD;  Location: [facility] ORS;  Service: General;  Laterality: Left;   FRACTURE SURGERY     KNEE  SURGERY     right   LAPAROSCOPIC SUPRACERVICAL HYSTERECTOMY  08/05/2013   ORIF WRIST FRACTURE Left 01/17/2017   Procedure: OPEN REDUCTION INTERNAL FIXATION (ORIF) WRIST FRACTURE;  Surgeon: Leora Lynwood SAUNDERS, MD;  Location: [facility] ORS;  Service: Orthopedics;  Laterality: Left;   RADIOLOGY WITH ANESTHESIA N/A 03/18/2020   Procedure: MRI WITH ANESTHESIA CERVICAL SPINE AND BRAIN  WITH AND WITHOUT CONTRAST;  Surgeon: Radiologist, Medication, MD;  Location: [facility] OR;  Service: Radiology;  Laterality: N/A;   TUBAL LIGATION Bilateral    VAGINAL HYSTERECTOMY  03/2006   Patient Active Problem List   Diagnosis Date Noted   Sinus tachycardia 07/07/2023   Hypokalemia 07/29/2022   Weakness of both lower extremities 07/29/2022   Abscess of left groin 11/15/2021   Abnormal LFTs 11/15/2021   Acute respiratory disease due to COVID-19 virus 11/21/2020   Weakness    Hypoalbuminemia due to protein-calorie malnutrition [facility])    Neurogenic bowel    Neurogenic bladder    Labile blood pressure    Neuropathic pain    Abscess of female pelvis    SVT (supraventricular tachycardia) (HCC)    Radial styloid tenosynovitis 03/12/2018   Wheelchair dependence 02/27/2018  Localized osteoporosis with current pathological fracture with routine healing 01/19/2017   Wrist fracture 01/16/2017   Sprain of ankle 03/23/2016   Closed fracture of lateral malleolus 03/16/2016   Health care maintenance 01/24/2016   Blood pressure elevated without history of HTN 10/25/2015   Essential hypertension 10/25/2015   Multiple sclerosis (HCC) 10/02/2015   Chronic left shoulder pain 07/19/2015   Multiple sclerosis exacerbation (HCC) 07/14/2015   MS (multiple sclerosis) (HCC) 11/26/2014   Increased body mass index 11/26/2014   HPV test positive 11/26/2014   Status post laparoscopic supracervical hysterectomy 11/26/2014   Galactorrhea 11/26/2014   Back ache 05/21/2014   Adiposity 05/21/2014   Disordered sleep 05/21/2014   Muscle  spasticity 05/21/2014   Spasticity 05/21/2014   Calculus of kidney 12/09/2013   Renal colic 12/09/2013   Hypercholesteremia 08/19/2013   Hereditary and idiopathic neuropathy 08/19/2013   Hypercholesterolemia without hypertriglyceridemia 08/19/2013   Bladder infection, chronic 07/25/2012   Disorder of bladder function 07/25/2012   Incomplete bladder emptying 07/25/2012   Microscopic hematuria 07/25/2012   Right upper quadrant pain 07/25/2012    ONSET DATE: 1995  REFERRING DIAG: MS  THERAPY DIAG:  Muscle weakness (generalized)  Multiple sclerosis exacerbation (HCC)  Other abnormalities of gait and mobility  Difficulty in walking, not elsewhere classified  Rationale for Evaluation and Treatment: Rehabilitation  SUBJECTIVE:                                                                                                                                                                                             SUBJECTIVE STATEMENT: Patient reports she is a little warm from the heat but is doing well otherwise.   Pt accompanied by: self  PERTINENT HISTORY:  Patient is returning to PT s/p hospitalization.  s/p ORIF of R tibia shaft fracture 09/23/2022. Patient has weakness in BLE with RLE>LLE. She drives with hand controls. Patient has been diagnosed with MS in 1995. PMH includes: back pain, CBP, chronic L shoulder pain, galactorrhea, neuropathy, HPV, hypercholestermeia, HTN, MS, osteopenia, PONV, wrist fracture. Additional order for other closed fracture of proximal end of R tibia with routine healing. Still has to use [employer].   PAIN:  Are you having pain? Occasional pain in RLE; primarily in knee  PRECAUTIONS: Fall  RED FLAGS: None   WEIGHT BEARING RESTRICTIONS: No  FALLS: Has patient fallen in last 6 months? No  LIVING ENVIRONMENT: Lives with: lives with their family Lives in: House/apartment Stairs: ramp Has following equipment at home: Vannie - 2 wheeled,  Wheelchair (manual), shower chair, and Grab bars  PLOF: Independent with household mobility with device  PATIENT  GOALS: to get her independence back. To be able to get into car, toilet, and get dressed independently  OBJECTIVE:  Note: Objective measures were completed at Evaluation unless otherwise noted.  DIAGNOSTIC FINDINGS: MRI of the brain 03/18/2020 showed multiple T2/FLAIR hyperintense foci in the periventricular, juxtacortical and deep white matter.  There were no infratentorial lesions noted.  None of the foci enhanced.   Due to severe claustrophobia, the study was done with conscious sedation in the hospital   COGNITION: Overall cognitive status: Within functional limits for tasks assessed   SENSATION: Lack of sensation in feet, loss in bilateral lateral aspect of knee  COORDINATION: Does not have the strength for functional LE heel slide test     MUSCLE TONE: BLE mild tone    POSTURE: rounded shoulders, forward head, anterior pelvic tilt, and weight shift left    LOWER EXTREMITY MMT:    MMT Right Eval Left Eval  Hip flexion 0.9 1.5  Hip extension    Hip abduction 1.8 2.6  Hip adduction 3.1 1.9  Hip internal rotation    Hip external rotation    Knee flexion 0.9 1.5  Knee extension 0.8 1.2  Ankle dorsiflexion    Ankle plantarflexion    Ankle inversion    Ankle eversion    (Blank rows = not tested)  BED MOBILITY:  Assess in future session due to limited time  TRANSFERS: Assistive device utilized: Bariatric RW with sit to stands, slide board to table  Sit to stand: unable to reach full stand Stand to sit: unable to reach full stand  Chair to chair: slide board with CGA     GAIT: Unable to ambulate at this time.   FUNCTIONAL TESTS:  Sit to stand: tricep press with BUE; unable to bring arm to walker.    Function In Sitting Test (FIST)  (1/2 femur on surface; hips/knees flexed to 90deg)   - indicate bed or mat table / step stool if  used  SCORING KEY: 4 = Independent (completes task independently & successfully) 3 = Verbal Cues/Increased Time (completes task independently & successfully and only needs more time/cues) 2 = Upper Extremity Support (must use UE for support or assistance to complete successfully) 1 = Needs Assistance (unable to complete w/o physical assist; DOCUMENT LEVEL: min, mod, max) 0 = Dependent (requires complete physical assist; unable to complete successfully even w/ physical assist)  Randomly Administer Once Throughout Exam  4 - Anterior Nudge (superior sternum)  4 - Posterior Nudge (between scapular spines)  4 - Lateral Nudge (to dominant side at acromion)     4 - Static sitting (30 seconds)  4 - Sitting, shake 'no' (left and right)  4 - Sitting, eyes closed (30 seconds)   0 - Sitting, lift foot (dominant side, lift foot 1 inch twice)    2 - Pick up object from behind (object at midline, hands breadth posterior)  3 - Forward reach (use dominant arm, must complete full motion) 2 - Lateral reach (use dominant arm, clear opposite ischial tuberosity) 2 - Pick up object from floor (from between feet)   2 - Posterior scooting (move backwards 2 inches)  2 - Anterior scooting (move forward 2 inches)  2 - Lateral scooting (move to dominant side 2 inches)    TOTAL = 39/56  Notes/comments: slide board tranfer to/from table   MCD > 5 points MCID for IP REHAB > 6 points  Function In Sitting Test (FIST) 08/14/23 (1/2 femur on surface; hips/knees flexed  to 90deg)   - indicate bed or mat table / step stool if used  SCORING KEY: 4 = Independent (completes task independently & successfully) 3 = Verbal Cues/Increased Time (completes task independently & successfully and only needs more time/cues) 2 = Upper Extremity Support (must use UE for support or assistance to complete successfully) 1 = Needs Assistance (unable to complete w/o physical assist; DOCUMENT LEVEL: min, mod, max) 0 = Dependent  (requires complete physical assist; unable to complete successfully even w/ physical assist)  Randomly Administer Once Throughout Exam  4 - Anterior Nudge (superior sternum)  4 - Posterior Nudge (between scapular spines)  4 - Lateral Nudge (to dominant side at acromion)     4 - Static sitting (30 seconds)  4 - Sitting, shake 'no' (left and right)  4 - Sitting, eyes closed (30 seconds)   0 - Sitting, lift foot (dominant side, lift foot 1 inch twice)    4 - Pick up object from behind (object at midline, hands breadth posterior)  4 - Forward reach (use dominant arm, must complete full motion) 4 - Lateral reach (use dominant arm, clear opposite ischial tuberosity) 2 - Pick up object from floor (from between feet)   3 - Posterior scooting (move backwards 2 inches)  3 - Anterior scooting (move forward 2 inches)  3 - Lateral scooting (move to dominant side 2 inches)    TOTAL = 47/56    MCD > 5 points MCID for IP REHAB > 6 points                                                                                                                              TREATMENT DATE: 10/30/23  Physical therapy treatment session today consisted of completing assessment of goals and administration of testing as demonstrated and documented in flow sheet, treatment, and goals section of this note. Addition treatments may be found below.   There.Act: Sit to stands from wheelchair with extra cushion in seat and BRW x2 person assist  -sit to stand x multiple attempts with CGA; one full success; three partial full stand, multiple attempts unable to transition hand to wheelchair  -use of ice pack required due to patient overheating  Neuro    Seated on dynadisc: -static sit 30 seconds x 3 trials -medial/lateral weight shift 10x -anterior/posterior weight shift 10x - hand clap for coordination and spatial awareness  -cross body knee taps 10x each side     PATIENT EDUCATION: Education details: goals,  POC, HEP  Person educated: Patient Education method: Explanation, Demonstration, Tactile cues, Verbal cues, and Handouts Education comprehension: verbalized understanding, returned demonstration, verbal cues required, tactile cues required, and needs further education  HOME EXERCISE PROGRAM: Stand 3x/day in stander   GOALS: Goals reviewed with patient? Yes  SHORT TERM GOALS: Target date: 08/08/2023    Patient will be independent in home exercise program to improve strength/mobility for better functional independence with ADLs.  Baseline:  4/8: compliance Goal status: MET    LONG TERM GOALS: Target date: 12/27/2023   Patient will tolerate five consecutive stands with UE support from wheelchair to standing to improve functional mobility.  Baseline: unable to perform 4/8:unable to perform full stand 5/20: perform next session 5/29: two full stands with CGA and cushion on her seat 6/24: one full stand two partial stands with cushion on her seat Goal status: Ongoing   2.  Patient will ambulate 10 ft with bRW with wheelchair follow.  Baseline:  unable to ambulate 4/8: unable to ambulate 5/20: able to ambulate length of // bars in previous session 5/27: ambulate 2.5 lengths of // bars with two seated rest breaks with min A for weight shift and wheelchair follow 6/12: 5 ft with wheelchair follow  Goal status: partially met  3.  Patient will improve FIST score >6 points to demonstrate improved stability and ability to perform ADLs.  Baseline:  3/6: 39/56 4/8: 47/ 56 5/20: 48/56 Goal status: MET  4.  Patient will perform toileting with mod I at home for improved independence.  Baseline: 3/6: requires assistance 4/8: requires dependence on machine 5/20: requires assistance with use of wipe buddy.  6/24: still using machine but able to wipe independently  Goal status: Partially Met    ASSESSMENT:  CLINICAL IMPRESSION: Patient's condition has the potential to improve in response to  therapy. Maximum improvement is yet to be obtained. The anticipated improvement is attainable and reasonable in a generally predictable time. Patient is able to perform sit to stands with prolonged rest beaks.  She has had a successful ambulation of 5 ft in previous session but unable to attempt today due to heat causing increased weakness.    Patient will benefit from skilled physical therapy to improve mobility, stability, and independence.   OBJECTIVE IMPAIRMENTS: Abnormal gait, cardiopulmonary status limiting activity, decreased activity tolerance, decreased balance, decreased coordination, decreased endurance, decreased mobility, difficulty walking, decreased ROM, decreased strength, hypomobility, increased fascial restrictions, impaired perceived functional ability, impaired flexibility, impaired sensation, improper body mechanics, and postural dysfunction.   ACTIVITY LIMITATIONS: carrying, lifting, bending, sitting, standing, squatting, sleeping, stairs, transfers, bed mobility, continence, bathing, toileting, dressing, self feeding, reach over head, hygiene/grooming, locomotion level, and caring for others  PARTICIPATION LIMITATIONS: meal prep, cleaning, laundry, interpersonal relationship, driving, shopping, community activity, and church  PERSONAL FACTORS: Age, Past/current experiences, Time since onset of injury/illness/exacerbation, Transportation, and 3+ comorbidities: ack pain, CBP, chronic L shoulder pain, galactorrhea, neuropathy, HPV, hypercholestermeia, HTN, MS, osteopenia, PONV, wrist fracture are also affecting patient's functional outcome.   REHAB POTENTIAL: Good  CLINICAL DECISION MAKING: Evolving/moderate complexity  EVALUATION COMPLEXITY: Moderate  PLAN:  PT FREQUENCY: 2x/week  PT DURATION: 12 weeks  PLANNED INTERVENTIONS: 97164- PT Re-evaluation, 97110-Therapeutic exercises, 97530- Therapeutic activity, 97112- Neuromuscular re-education, 97535- Self Care, 02859- Manual  therapy, [phone]- Gait training, [phone]- Orthotic Fit/training, [phone]- Canalith repositioning, [id number]- Aquatic Therapy, [phone]- Electrical stimulation (unattended), [phone]- Electrical stimulation (manual), [id number]- Ultrasound, [id number]- Traction (mechanical), Patient/Family education, Balance training, Stair training, Taping, Dry Needling, Joint mobilization, Joint manipulation, Spinal manipulation, Spinal mobilization, Scar mobilization, Compression bandaging, Vestibular training, Visual/preceptual remediation/compensation, Cognitive remediation, DME instructions, Cryotherapy, Moist heat, and Biofeedback  PLAN FOR NEXT SESSION: sit to stands; try supported walking    Jahseh Lucchese  Leopoldo, PT, DPT Physical Therapist - [facility]  Outpatient Physical Therapy- Main Campus [phone]    10/30/2023, 11:09 AM

## 2023-10-30 ENCOUNTER — Ambulatory Visit: Payer: PPO

## 2023-10-30 ENCOUNTER — Ambulatory Visit

## 2023-10-30 DIAGNOSIS — R278 Other lack of coordination: Secondary | ICD-10-CM

## 2023-10-30 DIAGNOSIS — M6281 Muscle weakness (generalized): Secondary | ICD-10-CM | POA: Diagnosis not present

## 2023-10-30 DIAGNOSIS — R262 Difficulty in walking, not elsewhere classified: Secondary | ICD-10-CM

## 2023-10-30 DIAGNOSIS — G35 Multiple sclerosis: Secondary | ICD-10-CM

## 2023-10-30 DIAGNOSIS — R2689 Other abnormalities of gait and mobility: Secondary | ICD-10-CM

## 2023-10-31 NOTE — Therapy (Signed)
 OUTPATIENT OCCUPATIONAL THERAPY NEURO TREATMENT NOTE   Patient Name: Lisa Williams MRN: [id number] DOB:21-Nov-1960, 63 y.o., female Today's Date: 10/31/2023  PCP: Dr. Layman Williams   Neurologist Dr. Suanne at [facility] REFERRING PROVIDER: Dr. Layman Williams    END OF SESSION:  OT End of Session - 10/31/23 1044     Visit Number 17    Number of Visits 24    Date for OT Re-Evaluation 11/08/23    Authorization Time Period Reporting period beginning 09/28/23    Progress Note Due on Visit 20    OT Start Time 1100    OT Stop Time 1143    OT Time Calculation (min) 43 min    Equipment Utilized During Treatment manual wc    Activity Tolerance Patient tolerated treatment well    Behavior During Therapy [facility] for tasks assessed/performed         Past Medical History:  Diagnosis Date   Abdominal pain, right upper quadrant    Back pain    Calculus of kidney 12/09/2013   Chronic back pain    unspecified   Chronic left shoulder pain 07/19/2015   Complication of anesthesia    Functional disorder of bladder    other   Galactorrhea 11/26/2014   Chronic    Hereditary and idiopathic neuropathy 08/19/2013   History of kidney stones    HPV test positive    Hypercholesteremia 08/19/2013   Hypertension    Incomplete bladder emptying    Microscopic hematuria    MS (multiple sclerosis) (HCC)    Muscle spasticity 05/21/2014   Nonspecific findings on examination of urine    other   Osteopenia    PONV (postoperative nausea and vomiting)    Status post laparoscopic supracervical hysterectomy 11/26/2014   Tobacco user 11/26/2014   Wrist fracture    Past Surgical History:  Procedure Laterality Date   bilateral tubal ligation  1996   BREAST CYST EXCISION Left 2002   CYST EXCISION Left 05/10/2022   Procedure: CYST REMOVAL;  Surgeon: Lisa Romano, MD;  Location: [facility] ORS;  Service: General;  Laterality: Left;   FRACTURE SURGERY     KNEE SURGERY     right   LAPAROSCOPIC  SUPRACERVICAL HYSTERECTOMY  08/05/2013   ORIF WRIST FRACTURE Left 01/17/2017   Procedure: OPEN REDUCTION INTERNAL FIXATION (ORIF) WRIST FRACTURE;  Surgeon: Lisa Lynwood SAUNDERS, MD;  Location: [facility] ORS;  Service: Orthopedics;  Laterality: Left;   RADIOLOGY WITH ANESTHESIA N/A 03/18/2020   Procedure: MRI WITH ANESTHESIA CERVICAL SPINE AND BRAIN  WITH AND WITHOUT CONTRAST;  Surgeon: Radiologist, Medication, MD;  Location: [facility] OR;  Service: Radiology;  Laterality: N/A;   TUBAL LIGATION Bilateral    VAGINAL HYSTERECTOMY  03/2006   Patient Active Problem List   Diagnosis Date Noted   Sinus tachycardia 07/07/2023   Hypokalemia 07/29/2022   Weakness of both lower extremities 07/29/2022   Abscess of left groin 11/15/2021   Abnormal LFTs 11/15/2021   Acute respiratory disease due to COVID-19 virus 11/21/2020   Weakness    Hypoalbuminemia due to protein-calorie malnutrition [facility])    Neurogenic bowel    Neurogenic bladder    Labile blood pressure    Neuropathic pain    Abscess of female pelvis    SVT (supraventricular tachycardia) (HCC)    Radial styloid tenosynovitis 03/12/2018   Wheelchair dependence 02/27/2018   Localized osteoporosis with current pathological fracture with routine healing 01/19/2017   Wrist fracture 01/16/2017   Sprain of ankle 03/23/2016   Closed  fracture of lateral malleolus 03/16/2016   Health care maintenance 01/24/2016   Blood pressure elevated without history of HTN 10/25/2015   Essential hypertension 10/25/2015   Multiple sclerosis (HCC) 10/02/2015   Chronic left shoulder pain 07/19/2015   Multiple sclerosis exacerbation (HCC) 07/14/2015   MS (multiple sclerosis) (HCC) 11/26/2014   Increased body mass index 11/26/2014   HPV test positive 11/26/2014   Status post laparoscopic supracervical hysterectomy 11/26/2014   Galactorrhea 11/26/2014   Back ache 05/21/2014   Adiposity 05/21/2014   Disordered sleep 05/21/2014   Muscle spasticity 05/21/2014   Spasticity  05/21/2014   Calculus of kidney 12/09/2013   Renal colic 12/09/2013   Hypercholesteremia 08/19/2013   Hereditary and idiopathic neuropathy 08/19/2013   Hypercholesterolemia without hypertriglyceridemia 08/19/2013   Bladder infection, chronic 07/25/2012   Disorder of bladder function 07/25/2012   Incomplete bladder emptying 07/25/2012   Microscopic hematuria 07/25/2012   Right upper quadrant pain 07/25/2012   ONSET DATE: 5/24 Amon and broke R leg which caused beginning of ADL decline; diagnosed with MS in 1995)  REFERRING DIAG: MS  THERAPY DIAG:  Muscle weakness (generalized)  Other lack of coordination  Multiple sclerosis exacerbation (HCC)  Rationale for Evaluation and Treatment: Rehabilitation  SUBJECTIVE:  SUBJECTIVE STATEMENT: Pt reports BLEs feel weak after PT, but pt is willing to attempt a toilet transfer. Pt accompanied by: self  PERTINENT HISTORY: Pt reports increased difficulty with basic self care tasks since breaking her R leg last May.  Since that time, mobility and ADLs have declined, with pt requiring assist from private caregivers and family to manage bathing, toileting, dressing, and functional transfers.  PRECAUTIONS: Fall  WEIGHT BEARING RESTRICTIONS: No  PAIN: 10/30/23: No pain reported today Are you having pain? No; occasional pain in R lower back, but not today  FALLS: Has patient fallen in last 6 months? No Last fall was May 17 of 2024  LIVING ENVIRONMENT: Lives with: lives with their family (including mother who has dementia and sister [name])  Lives in: 2 level home but pt resides on main level Stairs: ramp  Has following equipment at home: Wheelchair (manual), Shower bench, and hand held shower shower, 1 grab bar in the shower, 3in1 commode, sit to stand lift (electric), FWW, hospital bed  PLOF: modified indep with ADL/IADLs prior to May of 7975   PATIENT GOALS: Increase independence with basic self care tasks  OBJECTIVE:  Note: Objective  measures were completed at Evaluation unless otherwise noted.  HAND DOMINANCE: Right  ADLs:  Overall ADLs: assist provided from paid caregiver and sister Transfers/ambulation related to ADLs: sit to stand lift for all transfers, set up for sliding board in/out of bed  Eating: indep  Grooming: modified indep (wc level in mom's bathroom)  UB Dressing: set up (can't access her bedroom closet)  LB Dressing: able to sit on side of bed to don all LB clothing; assist to hike pants/underwear Toileting: assist with clothing management d/t standing within electric lift Bathing: bed bath with sister helping to wash backside Tub Shower transfers: N/A; pt has walk in shower in mom's bathroom (wc does fit in this bathroom but pt reports inability to transfer from wc and requires arm rests on a bench for a successful transfer attempt from any DME).  Tub bench is in pt's bathroom, but lift does not fit through bathroom doorway and pt reports inability to transfer up from tub bench (currently unable to manage either transfer)   Equipment: see above  IADLs: Shopping: Link  transit for shopping Light housekeeping: modified indep from wc level Meal Prep: modified indep from wc level Community mobility: relies on community transportation or family members Medication management: indep Landscape architect: indep Handwriting: NT; pt denies any FMC challenges  MOBILITY STATUS: Hx of falls  POSTURE COMMENTS:  Rounded shoulders, forward head, anterior pelvic tilt, and weight shift left   ACTIVITY TOLERANCE: Activity tolerance: Per PT; currently standing 30-60 sec within Light Gait harness/lift; currently non-ambulatory  UPPER EXTREMITY ROM:  BUEs WFL  UPPER EXTREMITY MMT:     MMT Right eval Left eval  Shoulder flexion 4+ 4  Shoulder abduction 4+ 4  Shoulder adduction    Shoulder extension    Shoulder internal rotation 4+ 4+  Shoulder external rotation 4 4  Middle trapezius    Lower trapezius     Elbow flexion 4+ 4+  Elbow extension 4+ 4+  Wrist flexion 4+ 4+  Wrist extension 4+ 4+  Wrist ulnar deviation    Wrist radial deviation    Wrist pronation    Wrist supination    (Blank rows = not tested)  HAND FUNCTION/COORDINATION:  R/L WNL; pt denies any coordination deficits/ able to manipulate pills/clothing fasteners/etc without difficulty  SENSATION: WFL  EDEMA: No visible edema in BUEs  MUSCLE TONE: BUEs WNL  COGNITION: Overall cognitive status: Within functional limits for tasks assessed  VISION: wears glasses all the time, no reports of diplopia  PERCEPTION: Not tested  PRAXIS: WFL  OBSERVATIONS:  Pt pleasant, cooperative, and motivated to work towards improving indep with ADLs.                                                                                                     TREATMENT DATE: 10/30/23 Self Care: -Scoot pivot transfer wc<>toilet; partial transfer completed with min vc for anterior WS  Therapeutic Activity: -Core stability: promoted forward and lateral (R/L) reaching outside BOS with return to midline without UE support (wc arm rests off) with participation in card sorting on table top; wc positioned several feet from table top to promote reach outside BOS.  Pt practiced contralateral and ipsilateral reaching and alternated arms frequently to reduce fatigue. -Core stability: ball toss/catch to challenge reaching in all directions outside BOS.  Completed without UE support/removal of wc arm rests   PATIENT EDUCATION: Education details: Core stability exercises Person educated: Patient Education method: Explanation, demo, vc Education comprehension: verbalized understanding, demonstrated understanding, further training needed  HOME EXERCISE PROGRAM: IR A/AAROM to promote shoulder flexibility for peri care/bathing  GOALS: Goals reviewed with patient? Yes  SHORT TERM GOALS: Target date: 09/27/23  Pt will perform bed bath with set up.   Baseline: Eval: Min-mod A for posterior washing; 09/28/23: Min A for posterior washing  Goal status: in progress  2.  Pt will utilize sliding board for wc<>drop arm commode transfer with SBA. Baseline: Eval: Currently using electric lift for transfer to [facility]; 09/28/23: Not yet completed at home but transfers to commode have been easier without sliding board d/t pt implementing a scoot pivot. Goal status: d/c (pt does not prefer sliding  board for commode transfer)  3.  Pt will be indep to perform HEP for maintaining BUE strength for ADLs and functional transfers. Baseline: Eval: HEP not yet initiated; 09/28/23: Pt is working on core stability exercises from wc, cane stretches for bilat shoulder IR, and tricep extension via wc pushups Goal status: indep  LONG TERM GOALS: Target date: 11/08/23  Pt will perform sink bath with min A. Baseline: Eval: Currently bed bathing d/t inability to stand at sink without electric lift (lift does not fit into pt's bathroom); 09/28/23: still completing bed bath as pt is not yet able to stand  Goal status: ongoing  2.  Pt will perform wc<>tub bench transfer with min A. Baseline: Eval: Unable; 09/28/23: Performed in OT clinic with min A, but not yet in the home Goal status: ongoing  3.  Pt will perform squat pivot transfer wc<>BSC with SBA. Baseline: Eval: Eval: Currently using electric lift for transfer to [facility]; 09/28/23: Not yet completed in clinic with [facility], and using lift for BSC in the home still Goal status: ongoing  4. Pt will perform scoot transfer wc<>toilet using grab bars with supv to allow toileting in community setting.  Baseline: Eval: Pt limits community outings d/t requiring use of lift for [facility] transfers; 09/28/23: Pt has performed 2 successful scoot pivot  Transfers to toilet in OT clinic with min A, extra time.  Further trials with clothing management on toilet needed, but pt can lower and hike  pants while sititng edge of mat via lateral leaning and  min A to maintain core stability.  Goal status: ongoing  5.  Pt will tolerate standing x1 min with close supv and BUE support to manage clothing in prep for toileting. Baseline: Eval: Currently standing in electric lift only, or within parallel bars without lift; 09/28/23: Not yet attempted Goal status: in progress  ASSESSMENT: CLINICAL IMPRESSION: Increased leg weakness following PT session this date.  Pt not quite able to complete full scoot transfer from wc<>toilet this date after struggling to lift self over wheel of wc.  Pt did make multiple attempts and adjustments to UE and LE placement in prep for transfer, but ultimately felt legs were too weak, so tx focus was then transitioned to core stability for improving ADLs and functional transfers.  Pt is most hesitant to reach outside BOS to the R and towards floor level, occasionally requiring 1 arm support to return to midline or to guard from extended reach.  Pt will continue to benefit from skilled OT to work towards above noted goals in OT poc, working to maximize indep with daily tasks while reducing burden of care on caregivers.   PERFORMANCE DEFICITS: in functional skills including ADLs, IADLs, strength, pain, flexibility, Gross motor control, mobility, balance, body mechanics, endurance, and decreased knowledge of use of DME, and psychosocial skills including coping strategies, environmental adaptation, habits, and routines and behaviors.   IMPAIRMENTS: are limiting patient from ADLs, IADLs, and social participation.   CO-MORBIDITIES: has co-morbidities such as neuropathy, back pain, obesity, MS, HTN that affects occupational performance. Patient will benefit from skilled OT to address above impairments and improve overall function.  MODIFICATION OR ASSISTANCE TO COMPLETE EVALUATION: No modification of tasks or assist necessary to complete an evaluation.  OT OCCUPATIONAL PROFILE AND HISTORY: Detailed assessment: Review of records and  additional review of physical, cognitive, psychosocial history related to current functional performance.  CLINICAL DECISION MAKING: Moderate - several treatment options, min-mod task modification necessary  REHAB POTENTIAL: Good  EVALUATION COMPLEXITY: Moderate    PLAN:  OT FREQUENCY: 2x/week  OT DURATION: 12 weeks  PLANNED INTERVENTIONS: 97168 OT Re-evaluation, 97535 self care/ADL training, 02889 therapeutic exercise, 97530 therapeutic activity, 97112 neuromuscular re-education, 97140 manual therapy, 97116 gait training, 02989 moist heat, 97010 cryotherapy, balance training, functional mobility training, psychosocial skills training, energy conservation, coping strategies training, patient/family education, and DME and/or AE instructions  RECOMMENDED OTHER SERVICES: None at this time (Pt currently receiving PT services in this clinic)  CONSULTED AND AGREED WITH PLAN OF CARE: Patient  PLAN FOR NEXT SESSION: see above   Inocente MARLA Blazing, OT 10/31/2023, 10:46 Williams

## 2023-11-01 ENCOUNTER — Ambulatory Visit: Payer: PPO

## 2023-11-01 ENCOUNTER — Ambulatory Visit

## 2023-11-01 DIAGNOSIS — M6281 Muscle weakness (generalized): Secondary | ICD-10-CM | POA: Diagnosis not present

## 2023-11-01 DIAGNOSIS — R2689 Other abnormalities of gait and mobility: Secondary | ICD-10-CM

## 2023-11-01 DIAGNOSIS — R2681 Unsteadiness on feet: Secondary | ICD-10-CM

## 2023-11-01 DIAGNOSIS — R269 Unspecified abnormalities of gait and mobility: Secondary | ICD-10-CM

## 2023-11-01 DIAGNOSIS — G35 Multiple sclerosis: Secondary | ICD-10-CM

## 2023-11-01 DIAGNOSIS — R262 Difficulty in walking, not elsewhere classified: Secondary | ICD-10-CM

## 2023-11-01 NOTE — Therapy (Signed)
 OUTPATIENT OCCUPATIONAL THERAPY NEURO TREATMENT NOTE   Patient Name: Lisa Williams MRN: [id number] DOB:01/13/1961, 63 y.o., female Today's Date: 11/01/2023  PCP: Dr. Layman Piety   Neurologist Dr. Suanne at [facility] REFERRING PROVIDER: Dr. Layman Piety    END OF SESSION:  OT End of Session - 11/01/23 1118     Visit Number 18    Number of Visits 24    Date for OT Re-Evaluation 11/08/23    Authorization Time Period Reporting period beginning 09/28/23    Progress Note Due on Visit 20    OT Start Time 1145    OT Stop Time 1230    OT Time Calculation (min) 45 min    Equipment Utilized During Treatment manual wc    Activity Tolerance Patient tolerated treatment well    Behavior During Therapy [facility] for tasks assessed/performed         Past Medical History:  Diagnosis Date   Abdominal pain, right upper quadrant    Back pain    Calculus of kidney 12/09/2013   Chronic back pain    unspecified   Chronic left shoulder pain 07/19/2015   Complication of anesthesia    Functional disorder of bladder    other   Galactorrhea 11/26/2014   Chronic    Hereditary and idiopathic neuropathy 08/19/2013   History of kidney stones    HPV test positive    Hypercholesteremia 08/19/2013   Hypertension    Incomplete bladder emptying    Microscopic hematuria    MS (multiple sclerosis) (HCC)    Muscle spasticity 05/21/2014   Nonspecific findings on examination of urine    other   Osteopenia    PONV (postoperative nausea and vomiting)    Status post laparoscopic supracervical hysterectomy 11/26/2014   Tobacco user 11/26/2014   Wrist fracture    Past Surgical History:  Procedure Laterality Date   bilateral tubal ligation  1996   BREAST CYST EXCISION Left 2002   CYST EXCISION Left 05/10/2022   Procedure: CYST REMOVAL;  Surgeon: Rodolph Romano, MD;  Location: [facility] ORS;  Service: General;  Laterality: Left;   FRACTURE SURGERY     KNEE SURGERY     right   LAPAROSCOPIC  SUPRACERVICAL HYSTERECTOMY  08/05/2013   ORIF WRIST FRACTURE Left 01/17/2017   Procedure: OPEN REDUCTION INTERNAL FIXATION (ORIF) WRIST FRACTURE;  Surgeon: Leora Lynwood SAUNDERS, MD;  Location: [facility] ORS;  Service: Orthopedics;  Laterality: Left;   RADIOLOGY WITH ANESTHESIA N/A 03/18/2020   Procedure: MRI WITH ANESTHESIA CERVICAL SPINE AND BRAIN  WITH AND WITHOUT CONTRAST;  Surgeon: Radiologist, Medication, MD;  Location: [facility] OR;  Service: Radiology;  Laterality: N/A;   TUBAL LIGATION Bilateral    VAGINAL HYSTERECTOMY  03/2006   Patient Active Problem List   Diagnosis Date Noted   Sinus tachycardia 07/07/2023   Hypokalemia 07/29/2022   Weakness of both lower extremities 07/29/2022   Abscess of left groin 11/15/2021   Abnormal LFTs 11/15/2021   Acute respiratory disease due to COVID-19 virus 11/21/2020   Weakness    Hypoalbuminemia due to protein-calorie malnutrition [facility])    Neurogenic bowel    Neurogenic bladder    Labile blood pressure    Neuropathic pain    Abscess of female pelvis    SVT (supraventricular tachycardia) (HCC)    Radial styloid tenosynovitis 03/12/2018   Wheelchair dependence 02/27/2018   Localized osteoporosis with current pathological fracture with routine healing 01/19/2017   Wrist fracture 01/16/2017   Sprain of ankle 03/23/2016   Closed  fracture of lateral malleolus 03/16/2016   Health care maintenance 01/24/2016   Blood pressure elevated without history of HTN 10/25/2015   Essential hypertension 10/25/2015   Multiple sclerosis (HCC) 10/02/2015   Chronic left shoulder pain 07/19/2015   Multiple sclerosis exacerbation (HCC) 07/14/2015   MS (multiple sclerosis) (HCC) 11/26/2014   Increased body mass index 11/26/2014   HPV test positive 11/26/2014   Status post laparoscopic supracervical hysterectomy 11/26/2014   Galactorrhea 11/26/2014   Back ache 05/21/2014   Adiposity 05/21/2014   Disordered sleep 05/21/2014   Muscle spasticity 05/21/2014   Spasticity  05/21/2014   Calculus of kidney 12/09/2013   Renal colic 12/09/2013   Hypercholesteremia 08/19/2013   Hereditary and idiopathic neuropathy 08/19/2013   Hypercholesterolemia without hypertriglyceridemia 08/19/2013   Bladder infection, chronic 07/25/2012   Disorder of bladder function 07/25/2012   Incomplete bladder emptying 07/25/2012   Microscopic hematuria 07/25/2012   Right upper quadrant pain 07/25/2012   ONSET DATE: 5/24 Amon and broke R leg which caused beginning of ADL decline; diagnosed with MS in 1995)  REFERRING DIAG: MS  THERAPY DIAG:  Multiple sclerosis exacerbation (HCC)  Other abnormalities of gait and mobility  Muscle weakness (generalized)  Rationale for Evaluation and Treatment: Rehabilitation  SUBJECTIVE:  SUBJECTIVE STATEMENT: Pt reports BLEs feel weak after PT, but is proud she could walk. Pt accompanied by: self  PERTINENT HISTORY: Pt reports increased difficulty with basic self care tasks since breaking her R leg last May.  Since that time, mobility and ADLs have declined, with pt requiring assist from private caregivers and family to manage bathing, toileting, dressing, and functional transfers.  PRECAUTIONS: Fall  WEIGHT BEARING RESTRICTIONS: No  PAIN: 10/30/23: No pain reported today Are you having pain? No; occasional pain in R lower back, but not today  FALLS: Has patient fallen in last 6 months? No Last fall was May 17 of 2024  LIVING ENVIRONMENT: Lives with: lives with their family (including mother who has dementia and sister [name])  Lives in: 2 level home but pt resides on main level Stairs: ramp  Has following equipment at home: Wheelchair (manual), Shower bench, and hand held shower shower, 1 grab bar in the shower, 3in1 commode, sit to stand lift (electric), FWW, hospital bed  PLOF: modified indep with ADL/IADLs prior to May of 7975   PATIENT GOALS: Increase independence with basic self care tasks  OBJECTIVE:  Note: Objective  measures were completed at Evaluation unless otherwise noted.  HAND DOMINANCE: Right  ADLs:  Overall ADLs: assist provided from paid caregiver and sister Transfers/ambulation related to ADLs: sit to stand lift for all transfers, set up for sliding board in/out of bed  Eating: indep  Grooming: modified indep (wc level in mom's bathroom)  UB Dressing: set up (can't access her bedroom closet)  LB Dressing: able to sit on side of bed to don all LB clothing; assist to hike pants/underwear Toileting: assist with clothing management d/t standing within electric lift Bathing: bed bath with sister helping to wash backside Tub Shower transfers: N/A; pt has walk in shower in mom's bathroom (wc does fit in this bathroom but pt reports inability to transfer from wc and requires arm rests on a bench for a successful transfer attempt from any DME).  Tub bench is in pt's bathroom, but lift does not fit through bathroom doorway and pt reports inability to transfer up from tub bench (currently unable to manage either transfer)   Equipment: see above  IADLs: Shopping: [employer]  for shopping Light housekeeping: modified indep from wc level Meal Prep: modified indep from wc level Community mobility: relies on community transportation or family members Medication management: indep Landscape architect: indep Handwriting: NT; pt denies any FMC challenges  MOBILITY STATUS: Hx of falls  POSTURE COMMENTS:  Rounded shoulders, forward head, anterior pelvic tilt, and weight shift left   ACTIVITY TOLERANCE: Activity tolerance: Per PT; currently standing 30-60 sec within Light Gait harness/lift; currently non-ambulatory  UPPER EXTREMITY ROM:  BUEs WFL  UPPER EXTREMITY MMT:     MMT Right eval Left eval  Shoulder flexion 4+ 4  Shoulder abduction 4+ 4  Shoulder adduction    Shoulder extension    Shoulder internal rotation 4+ 4+  Shoulder external rotation 4 4  Middle trapezius    Lower trapezius     Elbow flexion 4+ 4+  Elbow extension 4+ 4+  Wrist flexion 4+ 4+  Wrist extension 4+ 4+  Wrist ulnar deviation    Wrist radial deviation    Wrist pronation    Wrist supination    (Blank rows = not tested)  HAND FUNCTION/COORDINATION:  R/L WNL; pt denies any coordination deficits/ able to manipulate pills/clothing fasteners/etc without difficulty  SENSATION: WFL  EDEMA: No visible edema in BUEs  MUSCLE TONE: BUEs WNL  COGNITION: Overall cognitive status: Within functional limits for tasks assessed  VISION: wears glasses all the time, no reports of diplopia  PERCEPTION: Not tested  PRAXIS: WFL  OBSERVATIONS:  Pt pleasant, cooperative, and motivated to work towards improving indep with ADLs.                                                                                                     TREATMENT DATE: 11/01/23 Therapeutic Exercise: -seated BUE exercises with 5# weighted dowel, tolerated 2 set x 10 reps each - over head press, tricep pull down, horizontal adduction, circles and reverse circles.   Therapeutic Activity: -Core stability: promoted forward and lateral (R/L) reaching outside BOS with return to midline without UE support while transporting hand sized balls to various positions on tabletop. Pt practiced contralateral and ipsilateral reaching and alternated arms frequently. Second trial pt transported 1-3# dumbbells to various positions without UE support.    PATIENT EDUCATION: Education details: Core stability exercises Person educated: Patient Education method: Explanation, demo, vc Education comprehension: verbalized understanding, demonstrated understanding, further training needed  HOME EXERCISE PROGRAM: IR A/AAROM to promote shoulder flexibility for peri care/bathing  GOALS: Goals reviewed with patient? Yes  SHORT TERM GOALS: Target date: 09/27/23  Pt will perform bed bath with set up.  Baseline: Eval: Min-mod A for posterior washing; 09/28/23: Min A  for posterior washing  Goal status: in progress  2.  Pt will utilize sliding board for wc<>drop arm commode transfer with SBA. Baseline: Eval: Currently using electric lift for transfer to [facility]; 09/28/23: Not yet completed at home but transfers to commode have been easier without sliding board d/t pt implementing a scoot pivot. Goal status: d/c (pt does not prefer sliding board for commode transfer)  3.  Pt will be indep to  perform HEP for maintaining BUE strength for ADLs and functional transfers. Baseline: Eval: HEP not yet initiated; 09/28/23: Pt is working on core stability exercises from wc, cane stretches for bilat shoulder IR, and tricep extension via wc pushups Goal status: indep  LONG TERM GOALS: Target date: 11/08/23  Pt will perform sink bath with min A. Baseline: Eval: Currently bed bathing d/t inability to stand at sink without electric lift (lift does not fit into pt's bathroom); 09/28/23: still completing bed bath as pt is not yet able to stand  Goal status: ongoing  2.  Pt will perform wc<>tub bench transfer with min A. Baseline: Eval: Unable; 09/28/23: Performed in OT clinic with min A, but not yet in the home Goal status: ongoing  3.  Pt will perform squat pivot transfer wc<>BSC with SBA. Baseline: Eval: Eval: Currently using electric lift for transfer to [facility]; 09/28/23: Not yet completed in clinic with [facility], and using lift for BSC in the home still Goal status: ongoing  4. Pt will perform scoot transfer wc<>toilet using grab bars with supv to allow toileting in community setting.  Baseline: Eval: Pt limits community outings d/t requiring use of lift for [facility] transfers; 09/28/23: Pt has performed 2 successful scoot pivot  Transfers to toilet in OT clinic with min A, extra time.  Further trials with clothing management on toilet needed, but pt can lower and hike  pants while sititng edge of mat via lateral leaning and min A to maintain core stability.  Goal status: ongoing  5.  Pt  will tolerate standing x1 min with close supv and BUE support to manage clothing in prep for toileting. Baseline: Eval: Currently standing in electric lift only, or within parallel bars without lift; 09/28/23: Not yet attempted Goal status: in progress  ASSESSMENT: CLINICAL IMPRESSION: Increased leg weakness following PT session this date, deferred transfer training. Session focused on UE strengthening with 5# dowel, good technique with rest breaks between each movement. Tolerated 2 set x 10 reps each. Additional core strengthening and stability activities for improving functional reach and transfers. Increased difficulty to utilize 1-3# weights for reaching task with good tolerance.  Pt will continue to benefit from skilled OT to work towards above noted goals in OT poc, working to maximize indep with daily tasks while reducing burden of care on caregivers.   PERFORMANCE DEFICITS: in functional skills including ADLs, IADLs, strength, pain, flexibility, Gross motor control, mobility, balance, body mechanics, endurance, and decreased knowledge of use of DME, and psychosocial skills including coping strategies, environmental adaptation, habits, and routines and behaviors.   IMPAIRMENTS: are limiting patient from ADLs, IADLs, and social participation.   CO-MORBIDITIES: has co-morbidities such as neuropathy, back pain, obesity, MS, HTN that affects occupational performance. Patient will benefit from skilled OT to address above impairments and improve overall function.  MODIFICATION OR ASSISTANCE TO COMPLETE EVALUATION: No modification of tasks or assist necessary to complete an evaluation.  OT OCCUPATIONAL PROFILE AND HISTORY: Detailed assessment: Review of records and additional review of physical, cognitive, psychosocial history related to current functional performance.  CLINICAL DECISION MAKING: Moderate - several treatment options, min-mod task modification necessary  REHAB POTENTIAL:  Good  EVALUATION COMPLEXITY: Moderate    PLAN:  OT FREQUENCY: 2x/week  OT DURATION: 12 weeks  PLANNED INTERVENTIONS: 97168 OT Re-evaluation, 97535 self care/ADL training, 02889 therapeutic exercise, 97530 therapeutic activity, 97112 neuromuscular re-education, 97140 manual therapy, 97116 gait training, 02989 moist heat, 97010 cryotherapy, balance training, functional mobility training, psychosocial skills training, energy  conservation, coping strategies training, patient/family education, and DME and/or AE instructions  RECOMMENDED OTHER SERVICES: None at this time (Pt currently receiving PT services in this clinic)  CONSULTED AND AGREED WITH PLAN OF CARE: Patient  PLAN FOR NEXT SESSION: see above  Elston Slot, M.S. OTR/L  11/01/23, 11:20 AM  ascom [phone]   Elston JINNY Slot, OT 11/01/2023, 11:20 AM

## 2023-11-01 NOTE — Therapy (Addendum)
 OUTPATIENT PHYSICAL THERAPY NEURO TREATMENT       Patient Name: Lisa Williams MRN: [id number] DOB:01-14-1961, 63 y.o., female Today's Date: 11/01/2023   PCP: Lenon Na  REFERRING PROVIDER: Lenon Na  END OF SESSION:  PT End of Session - 11/01/23 1054     Visit Number 31    Number of Visits 47    Date for PT Re-Evaluation 12/27/23   corrected   PT Start Time 1100    PT Stop Time 1140    PT Time Calculation (min) 40 min    Equipment Utilized During Treatment Gait belt    Activity Tolerance Patient tolerated treatment well;No increased pain    Behavior During Therapy [facility] for tasks assessed/performed                                    Past Medical History:  Diagnosis Date   Abdominal pain, right upper quadrant    Back pain    Calculus of kidney 12/09/2013   Chronic back pain    unspecified   Chronic left shoulder pain 07/19/2015   Complication of anesthesia    Functional disorder of bladder    other   Galactorrhea 11/26/2014   Chronic    Hereditary and idiopathic neuropathy 08/19/2013   History of kidney stones    HPV test positive    Hypercholesteremia 08/19/2013   Hypertension    Incomplete bladder emptying    Microscopic hematuria    MS (multiple sclerosis) (HCC)    Muscle spasticity 05/21/2014   Nonspecific findings on examination of urine    other   Osteopenia    PONV (postoperative nausea and vomiting)    Status post laparoscopic supracervical hysterectomy 11/26/2014   Tobacco user 11/26/2014   Wrist fracture    Past Surgical History:  Procedure Laterality Date   bilateral tubal ligation  1996   BREAST CYST EXCISION Left 2002   CYST EXCISION Left 05/10/2022   Procedure: CYST REMOVAL;  Surgeon: Rodolph Romano, MD;  Location: [facility] ORS;  Service: General;  Laterality: Left;   FRACTURE SURGERY     KNEE SURGERY     right   LAPAROSCOPIC SUPRACERVICAL HYSTERECTOMY  08/05/2013   ORIF WRIST  FRACTURE Left 01/17/2017   Procedure: OPEN REDUCTION INTERNAL FIXATION (ORIF) WRIST FRACTURE;  Surgeon: Leora Lynwood SAUNDERS, MD;  Location: [facility] ORS;  Service: Orthopedics;  Laterality: Left;   RADIOLOGY WITH ANESTHESIA N/A 03/18/2020   Procedure: MRI WITH ANESTHESIA CERVICAL SPINE AND BRAIN  WITH AND WITHOUT CONTRAST;  Surgeon: Radiologist, Medication, MD;  Location: [facility] OR;  Service: Radiology;  Laterality: N/A;   TUBAL LIGATION Bilateral    VAGINAL HYSTERECTOMY  03/2006   Patient Active Problem List   Diagnosis Date Noted   Sinus tachycardia 07/07/2023   Hypokalemia 07/29/2022   Weakness of both lower extremities 07/29/2022   Abscess of left groin 11/15/2021   Abnormal LFTs 11/15/2021   Acute respiratory disease due to COVID-19 virus 11/21/2020   Weakness    Hypoalbuminemia due to protein-calorie malnutrition [facility])    Neurogenic bowel    Neurogenic bladder    Labile blood pressure    Neuropathic pain    Abscess of female pelvis    SVT (supraventricular tachycardia) (HCC)    Radial styloid tenosynovitis 03/12/2018   Wheelchair dependence 02/27/2018   Localized osteoporosis with current pathological fracture with routine healing 01/19/2017   Wrist fracture 01/16/2017   Sprain  of ankle 03/23/2016   Closed fracture of lateral malleolus 03/16/2016   Health care maintenance 01/24/2016   Blood pressure elevated without history of HTN 10/25/2015   Essential hypertension 10/25/2015   Multiple sclerosis (HCC) 10/02/2015   Chronic left shoulder pain 07/19/2015   Multiple sclerosis exacerbation (HCC) 07/14/2015   MS (multiple sclerosis) (HCC) 11/26/2014   Increased body mass index 11/26/2014   HPV test positive 11/26/2014   Status post laparoscopic supracervical hysterectomy 11/26/2014   Galactorrhea 11/26/2014   Back ache 05/21/2014   Adiposity 05/21/2014   Disordered sleep 05/21/2014   Muscle spasticity 05/21/2014   Spasticity 05/21/2014   Calculus of kidney 12/09/2013   Renal colic  12/09/2013   Hypercholesteremia 08/19/2013   Hereditary and idiopathic neuropathy 08/19/2013   Hypercholesterolemia without hypertriglyceridemia 08/19/2013   Bladder infection, chronic 07/25/2012   Disorder of bladder function 07/25/2012   Incomplete bladder emptying 07/25/2012   Microscopic hematuria 07/25/2012   Right upper quadrant pain 07/25/2012    ONSET DATE: 1995  REFERRING DIAG: MS  THERAPY DIAG:  Muscle weakness (generalized)  Other abnormalities of gait and mobility  Difficulty in walking, not elsewhere classified  Unsteadiness on feet  Abnormality of gait and mobility  Rationale for Evaluation and Treatment: Rehabilitation  SUBJECTIVE:                                                                                                                                                                                             SUBJECTIVE STATEMENT: Pt reports feeling good today. Denies soreness and fatigue from previous session.   Pt accompanied by: self  PERTINENT HISTORY:  Patient is returning to PT s/p hospitalization.  s/p ORIF of R tibia shaft fracture 09/23/2022. Patient has weakness in BLE with RLE>LLE. She drives with hand controls. Patient has been diagnosed with MS in 1995. PMH includes: back pain, CBP, chronic L shoulder pain, galactorrhea, neuropathy, HPV, hypercholestermeia, HTN, MS, osteopenia, PONV, wrist fracture. Additional order for other closed fracture of proximal end of R tibia with routine healing. Still has to use [employer].   PAIN:  Are you having pain? Occasional pain in RLE; primarily in knee  PRECAUTIONS: Fall  RED FLAGS: None   WEIGHT BEARING RESTRICTIONS: No  FALLS: Has patient fallen in last 6 months? No  LIVING ENVIRONMENT: Lives with: lives with their family Lives in: House/apartment Stairs: ramp Has following equipment at home: Vannie - 2 wheeled, Wheelchair (manual), shower chair, and Grab bars  PLOF: Independent with  household mobility with device  PATIENT GOALS: to get her independence back. To be able to get into car, toilet, and get  dressed independently  OBJECTIVE:  Note: Objective measures were completed at Evaluation unless otherwise noted.  DIAGNOSTIC FINDINGS: MRI of the brain 03/18/2020 showed multiple T2/FLAIR hyperintense foci in the periventricular, juxtacortical and deep white matter.  There were no infratentorial lesions noted.  None of the foci enhanced.   Due to severe claustrophobia, the study was done with conscious sedation in the hospital   COGNITION: Overall cognitive status: Within functional limits for tasks assessed   SENSATION: Lack of sensation in feet, loss in bilateral lateral aspect of knee  COORDINATION: Does not have the strength for functional LE heel slide test     MUSCLE TONE: BLE mild tone    POSTURE: rounded shoulders, forward head, anterior pelvic tilt, and weight shift left    LOWER EXTREMITY MMT:    MMT Right Eval Left Eval  Hip flexion 0.9 1.5  Hip extension    Hip abduction 1.8 2.6  Hip adduction 3.1 1.9  Hip internal rotation    Hip external rotation    Knee flexion 0.9 1.5  Knee extension 0.8 1.2  Ankle dorsiflexion    Ankle plantarflexion    Ankle inversion    Ankle eversion    (Blank rows = not tested)  BED MOBILITY:  Assess in future session due to limited time  TRANSFERS: Assistive device utilized: Bariatric RW with sit to stands, slide board to table  Sit to stand: unable to reach full stand Stand to sit: unable to reach full stand  Chair to chair: slide board with CGA     GAIT: Unable to ambulate at this time.   FUNCTIONAL TESTS:  Sit to stand: tricep press with BUE; unable to bring arm to walker.    Function In Sitting Test (FIST)  (1/2 femur on surface; hips/knees flexed to 90deg)   - indicate bed or mat table / step stool if used  SCORING KEY: 4 = Independent (completes task independently & successfully) 3 =  Verbal Cues/Increased Time (completes task independently & successfully and only needs more time/cues) 2 = Upper Extremity Support (must use UE for support or assistance to complete successfully) 1 = Needs Assistance (unable to complete w/o physical assist; DOCUMENT LEVEL: min, mod, max) 0 = Dependent (requires complete physical assist; unable to complete successfully even w/ physical assist)  Randomly Administer Once Throughout Exam  4 - Anterior Nudge (superior sternum)  4 - Posterior Nudge (between scapular spines)  4 - Lateral Nudge (to dominant side at acromion)     4 - Static sitting (30 seconds)  4 - Sitting, shake 'no' (left and right)  4 - Sitting, eyes closed (30 seconds)   0 - Sitting, lift foot (dominant side, lift foot 1 inch twice)    2 - Pick up object from behind (object at midline, hands breadth posterior)  3 - Forward reach (use dominant arm, must complete full motion) 2 - Lateral reach (use dominant arm, clear opposite ischial tuberosity) 2 - Pick up object from floor (from between feet)   2 - Posterior scooting (move backwards 2 inches)  2 - Anterior scooting (move forward 2 inches)  2 - Lateral scooting (move to dominant side 2 inches)    TOTAL = 39/56  Notes/comments: slide board tranfer to/from table   MCD > 5 points MCID for IP REHAB > 6 points  Function In Sitting Test (FIST) 08/14/23 (1/2 femur on surface; hips/knees flexed to 90deg)   - indicate bed or mat table / step stool if used  SCORING KEY: 4 = Independent (completes task independently & successfully) 3 = Verbal Cues/Increased Time (completes task independently & successfully and only needs more time/cues) 2 = Upper Extremity Support (must use UE for support or assistance to complete successfully) 1 = Needs Assistance (unable to complete w/o physical assist; DOCUMENT LEVEL: min, mod, max) 0 = Dependent (requires complete physical assist; unable to complete successfully even w/ physical  assist)  Randomly Administer Once Throughout Exam  4 - Anterior Nudge (superior sternum)  4 - Posterior Nudge (between scapular spines)  4 - Lateral Nudge (to dominant side at acromion)     4 - Static sitting (30 seconds)  4 - Sitting, shake 'no' (left and right)  4 - Sitting, eyes closed (30 seconds)   0 - Sitting, lift foot (dominant side, lift foot 1 inch twice)    4 - Pick up object from behind (object at midline, hands breadth posterior)  4 - Forward reach (use dominant arm, must complete full motion) 4 - Lateral reach (use dominant arm, clear opposite ischial tuberosity) 2 - Pick up object from floor (from between feet)   3 - Posterior scooting (move backwards 2 inches)  3 - Anterior scooting (move forward 2 inches)  3 - Lateral scooting (move to dominant side 2 inches)    TOTAL = 47/56    MCD > 5 points MCID for IP REHAB > 6 points                                                                                                                              TREATMENT DATE: 11/01/23  There.Act: In // bars with gait belt and CGA: -sit to stand x4- one unsuccessful trial due to trembling in R LE  - ambulation x1.5 length of // bars over 2 trials - standing weight shifting x2 to improve R LE trembling  Thorough rest breaks provided during session to prevent fatigue   TherEx:  Activity Description: Patient seated in front of mat table, 6 pods arranged in half-circle, patient must lean and reach to tap pod as it lights up Activity Setting:  The Blaze Pod Random setting was chosen to enhance cognitive processing and agility, providing an unpredictable environment to simulate real-world scenarios, and fostering quick reactions and adaptability.  Number of Pods:  6 Cycles/Sets:  3 Duration (Time or Hit Count):  1:00  Patient Stats  Hits:   Round 1: 56, Round 2: 53 hits- placed pods slightly further apart for more challenge, Round 3: 56 hits  Seated on dynadisc: -static  sit 30 seconds x 2 trials -medial/lateral weight shift 10x -anterior/posterior weight shift 10x - hand clap for coordination and spatial awareness 2x1 minute   PATIENT EDUCATION: Education details: goals, POC, HEP  Person educated: Patient Education method: Explanation, Demonstration, Tactile cues, Verbal cues, and Handouts Education comprehension: verbalized understanding, returned demonstration, verbal cues required, tactile cues required, and needs further education  HOME EXERCISE PROGRAM: Stand  3x/day in stander   GOALS: Goals reviewed with patient? Yes  SHORT TERM GOALS: Target date: 08/08/2023    Patient will be independent in home exercise program to improve strength/mobility for better functional independence with ADLs.  Baseline: 4/8: compliance Goal status: MET    LONG TERM GOALS: Target date: 12/27/2023   Patient will tolerate five consecutive stands with UE support from wheelchair to standing to improve functional mobility.  Baseline: unable to perform 4/8:unable to perform full stand 5/20: perform next session 5/29: two full stands with CGA and cushion on her seat Goal status: Ongoing   2.  Patient will ambulate 10 ft with bRW with wheelchair follow.  Baseline:  unable to ambulate 4/8: unable to ambulate 5/20: able to ambulate length of // bars in previous session 5/27: ambulate 2.5 lengths of // bars with two seated rest breaks with min A for weight shift and wheelchair follow  Goal status: partially met  3.  Patient will improve FIST score >6 points to demonstrate improved stability and ability to perform ADLs.  Baseline:  3/6: 39/56 4/8: 47/ 56 5/20: 48/56 Goal status: MET  4.  Patient will perform toileting with mod I at home for improved independence.  Baseline: 3/6: requires assistance 4/8: requires dependence on machine 5/20: requires assistance with use of wipe buddy.  Goal status: Partially Met    ASSESSMENT:  CLINICAL IMPRESSION: Pt presented  with higher energy levels and capacity for standing and walking in // bars today. Pt responded well to weight shifting and reaching outside BOS activities in session. Pt remained motivated to improve walking capacity and strength throughout session. Patient will benefit from skilled physical therapy to improve mobility, stability, and independence.   OBJECTIVE IMPAIRMENTS: Abnormal gait, cardiopulmonary status limiting activity, decreased activity tolerance, decreased balance, decreased coordination, decreased endurance, decreased mobility, difficulty walking, decreased ROM, decreased strength, hypomobility, increased fascial restrictions, impaired perceived functional ability, impaired flexibility, impaired sensation, improper body mechanics, and postural dysfunction.   ACTIVITY LIMITATIONS: carrying, lifting, bending, sitting, standing, squatting, sleeping, stairs, transfers, bed mobility, continence, bathing, toileting, dressing, self feeding, reach over head, hygiene/grooming, locomotion level, and caring for others  PARTICIPATION LIMITATIONS: meal prep, cleaning, laundry, interpersonal relationship, driving, shopping, community activity, and church  PERSONAL FACTORS: Age, Past/current experiences, Time since onset of injury/illness/exacerbation, Transportation, and 3+ comorbidities: ack pain, CBP, chronic L shoulder pain, galactorrhea, neuropathy, HPV, hypercholestermeia, HTN, MS, osteopenia, PONV, wrist fracture are also affecting patient's functional outcome.   REHAB POTENTIAL: Good  CLINICAL DECISION MAKING: Evolving/moderate complexity  EVALUATION COMPLEXITY: Moderate  PLAN:  PT FREQUENCY: 2x/week  PT DURATION: 12 weeks  PLANNED INTERVENTIONS: 97164- PT Re-evaluation, 97110-Therapeutic exercises, 97530- Therapeutic activity, 97112- Neuromuscular re-education, 97535- Self Care, [id number]- Manual therapy, [phone]- Gait training, [phone]- Orthotic Fit/training, [phone]- Canalith repositioning,  [id number]- Aquatic Therapy, [phone]- Electrical stimulation (unattended), [phone]- Electrical stimulation (manual), [id number]- Ultrasound, [id number]- Traction (mechanical), Patient/Family education, Balance training, Stair training, Taping, Dry Needling, Joint mobilization, Joint manipulation, Spinal manipulation, Spinal mobilization, Scar mobilization, Compression bandaging, Vestibular training, Visual/preceptual remediation/compensation, Cognitive remediation, DME instructions, Cryotherapy, Moist heat, and Biofeedback  PLAN FOR NEXT SESSION: sit to stands; try supported walking   Jowanna Loeffler, SPT  This entire session was performed under direct supervision and direction of a licensed therapist/therapist assistant . I have personally read, edited and approve of the note as written.   Marina  Leopoldo, PT, DPT Physical Therapist - [facility]  Outpatient Physical Therapy- Main Campus [phone]    11/01/2023, 2:46  PM

## 2023-11-06 ENCOUNTER — Encounter

## 2023-11-06 ENCOUNTER — Ambulatory Visit: Payer: PPO | Attending: Neurology

## 2023-11-08 ENCOUNTER — Ambulatory Visit

## 2023-11-08 ENCOUNTER — Ambulatory Visit: Payer: PPO | Attending: Neurology

## 2023-11-08 DIAGNOSIS — G35 Multiple sclerosis: Secondary | ICD-10-CM | POA: Diagnosis not present

## 2023-11-08 DIAGNOSIS — R269 Unspecified abnormalities of gait and mobility: Secondary | ICD-10-CM | POA: Diagnosis not present

## 2023-11-08 DIAGNOSIS — R262 Difficulty in walking, not elsewhere classified: Secondary | ICD-10-CM | POA: Insufficient documentation

## 2023-11-08 DIAGNOSIS — M6281 Muscle weakness (generalized): Secondary | ICD-10-CM

## 2023-11-08 DIAGNOSIS — R2681 Unsteadiness on feet: Secondary | ICD-10-CM | POA: Insufficient documentation

## 2023-11-08 DIAGNOSIS — R278 Other lack of coordination: Secondary | ICD-10-CM | POA: Insufficient documentation

## 2023-11-08 DIAGNOSIS — R2689 Other abnormalities of gait and mobility: Secondary | ICD-10-CM | POA: Insufficient documentation

## 2023-11-08 NOTE — Therapy (Signed)
 OUTPATIENT PHYSICAL THERAPY NEURO TREATMENT       Patient Name: Lisa Williams MRN: [id number] DOB:[birth year], 63 y.o., female Today's Date: 11/08/2023   PCP: Lenon Na  REFERRING PROVIDER: Lenon Na  END OF SESSION:  PT End of Session - 11/08/23 1100     Visit Number 32    Number of Visits 47    Date for PT Re-Evaluation 12/27/23   corrected   PT Start Time 1100    PT Stop Time 1141    PT Time Calculation (min) 41 min    Equipment Utilized During Treatment Gait belt    Activity Tolerance Patient tolerated treatment well;No increased pain    Behavior During Therapy [facility] for tasks assessed/performed                                     Past Medical History:  Diagnosis Date   Abdominal pain, right upper quadrant    Back pain    Calculus of kidney 12/09/2013   Chronic back pain    unspecified   Chronic left shoulder pain 07/19/2015   Complication of anesthesia    Functional disorder of bladder    other   Galactorrhea 11/26/2014   Chronic    Hereditary and idiopathic neuropathy 08/19/2013   History of kidney stones    HPV test positive    Hypercholesteremia 08/19/2013   Hypertension    Incomplete bladder emptying    Microscopic hematuria    MS (multiple sclerosis) (HCC)    Muscle spasticity 05/21/2014   Nonspecific findings on examination of urine    other   Osteopenia    PONV (postoperative nausea and vomiting)    Status post laparoscopic supracervical hysterectomy 11/26/2014   Tobacco user 11/26/2014   Wrist fracture    Past Surgical History:  Procedure Laterality Date   bilateral tubal ligation  1996   BREAST CYST EXCISION Left 2002   CYST EXCISION Left 05/10/2022   Procedure: CYST REMOVAL;  Surgeon: Rodolph Romano, MD;  Location: [facility] ORS;  Service: General;  Laterality: Left;   FRACTURE SURGERY     KNEE SURGERY     right   LAPAROSCOPIC SUPRACERVICAL HYSTERECTOMY  08/05/2013   ORIF WRIST  FRACTURE Left 01/17/2017   Procedure: OPEN REDUCTION INTERNAL FIXATION (ORIF) WRIST FRACTURE;  Surgeon: Leora Lynwood SAUNDERS, MD;  Location: [facility];  Service: Orthopedics;  Laterality: Left;   RADIOLOGY WITH ANESTHESIA N/A 03/18/2020   Procedure: MRI WITH ANESTHESIA CERVICAL SPINE AND BRAIN  WITH AND WITHOUT CONTRAST;  Surgeon: Radiologist, Medication, MD;  Location: [facility] OR;  Service: Radiology;  Laterality: N/A;   TUBAL LIGATION Bilateral    VAGINAL HYSTERECTOMY  03/2006   Patient Active Problem List   Diagnosis Date Noted   Sinus tachycardia 07/07/2023   Hypokalemia 07/29/2022   Weakness of both lower extremities 07/29/2022   Abscess of left groin 11/15/2021   Abnormal LFTs 11/15/2021   Acute respiratory disease due to COVID-19 virus 11/21/2020   Weakness    Hypoalbuminemia due to protein-calorie malnutrition [facility])    Neurogenic bowel    Neurogenic bladder    Labile blood pressure    Neuropathic pain    Abscess of female pelvis    SVT (supraventricular tachycardia) (HCC)    Radial styloid tenosynovitis 03/12/2018   Wheelchair dependence 02/27/2018   Localized osteoporosis with current pathological fracture with routine healing 01/19/2017   Wrist fracture 01/16/2017  Sprain of ankle 03/23/2016   Closed fracture of lateral malleolus 03/16/2016   Health care maintenance 01/24/2016   Blood pressure elevated without history of HTN 10/25/2015   Essential hypertension 10/25/2015   Multiple sclerosis (HCC) 10/02/2015   Chronic left shoulder pain 07/19/2015   Multiple sclerosis exacerbation (HCC) 07/14/2015   MS (multiple sclerosis) (HCC) 11/26/2014   Increased body mass index 11/26/2014   HPV test positive 11/26/2014   Status post laparoscopic supracervical hysterectomy 11/26/2014   Galactorrhea 11/26/2014   Back ache 05/21/2014   Adiposity 05/21/2014   Disordered sleep 05/21/2014   Muscle spasticity 05/21/2014   Spasticity 05/21/2014   Calculus of kidney 12/09/2013   Renal colic  12/09/2013   Hypercholesteremia 08/19/2013   Hereditary and idiopathic neuropathy 08/19/2013   Hypercholesterolemia without hypertriglyceridemia 08/19/2013   Bladder infection, chronic 07/25/2012   Disorder of bladder function 07/25/2012   Incomplete bladder emptying 07/25/2012   Microscopic hematuria 07/25/2012   Right upper quadrant pain 07/25/2012    ONSET DATE: 1995  REFERRING DIAG: MS  THERAPY DIAG:  Muscle weakness (generalized)  Unsteadiness on feet  Other lack of coordination  Difficulty in walking, not elsewhere classified  Rationale for Evaluation and Treatment: Rehabilitation  SUBJECTIVE:                                                                                                                                                                                             SUBJECTIVE STATEMENT: Pt reports I'm just not feeling like myself today. Pt states that a company is coming to her house today to evaluate her for a standing frame.  Pt accompanied by: self  PERTINENT HISTORY:  Patient is returning to PT s/p hospitalization.  s/p ORIF of R tibia shaft fracture 09/23/2022. Patient has weakness in BLE with RLE>LLE. She drives with hand controls. Patient has been diagnosed with MS in 1995. PMH includes: back pain, CBP, chronic L shoulder pain, galactorrhea, neuropathy, HPV, hypercholestermeia, HTN, MS, osteopenia, PONV, wrist fracture. Additional order for other closed fracture of proximal end of R tibia with routine healing. Still has to use [employer].   PAIN:  Are you having pain? Occasional pain in RLE; primarily in knee  PRECAUTIONS: Fall  RED FLAGS: None   WEIGHT BEARING RESTRICTIONS: No  FALLS: Has patient fallen in last 6 months? No  LIVING ENVIRONMENT: Lives with: lives with their family Lives in: House/apartment Stairs: ramp Has following equipment at home: Vannie - 2 wheeled, Wheelchair (manual), shower chair, and Grab bars  PLOF: Independent  with household mobility with device  PATIENT GOALS: to get her independence back. To be able  to get into car, toilet, and get dressed independently  OBJECTIVE:  Note: Objective measures were completed at Evaluation unless otherwise noted.  DIAGNOSTIC FINDINGS: MRI of the brain 03/18/2020 showed multiple T2/FLAIR hyperintense foci in the periventricular, juxtacortical and deep white matter.  There were no infratentorial lesions noted.  None of the foci enhanced.   Due to severe claustrophobia, the study was done with conscious sedation in the hospital   COGNITION: Overall cognitive status: Within functional limits for tasks assessed   SENSATION: Lack of sensation in feet, loss in bilateral lateral aspect of knee  COORDINATION: Does not have the strength for functional LE heel slide test     MUSCLE TONE: BLE mild tone    POSTURE: rounded shoulders, forward head, anterior pelvic tilt, and weight shift left    LOWER EXTREMITY MMT:    MMT Right Eval Left Eval  Hip flexion 0.9 1.5  Hip extension    Hip abduction 1.8 2.6  Hip adduction 3.1 1.9  Hip internal rotation    Hip external rotation    Knee flexion 0.9 1.5  Knee extension 0.8 1.2  Ankle dorsiflexion    Ankle plantarflexion    Ankle inversion    Ankle eversion    (Blank rows = not tested)  BED MOBILITY:  Assess in future session due to limited time  TRANSFERS: Assistive device utilized: Bariatric RW with sit to stands, slide board to table  Sit to stand: unable to reach full stand Stand to sit: unable to reach full stand  Chair to chair: slide board with CGA     GAIT: Unable to ambulate at this time.   FUNCTIONAL TESTS:  Sit to stand: tricep press with BUE; unable to bring arm to walker.    Function In Sitting Test (FIST)  (1/2 femur on surface; hips/knees flexed to 90deg)   - indicate bed or mat table / step stool if used  SCORING KEY: 4 = Independent (completes task independently &  successfully) 3 = Verbal Cues/Increased Time (completes task independently & successfully and only needs more time/cues) 2 = Upper Extremity Support (must use UE for support or assistance to complete successfully) 1 = Needs Assistance (unable to complete w/o physical assist; DOCUMENT LEVEL: min, mod, max) 0 = Dependent (requires complete physical assist; unable to complete successfully even w/ physical assist)  Randomly Administer Once Throughout Exam  4 - Anterior Nudge (superior sternum)  4 - Posterior Nudge (between scapular spines)  4 - Lateral Nudge (to dominant side at acromion)     4 - Static sitting (30 seconds)  4 - Sitting, shake 'no' (left and right)  4 - Sitting, eyes closed (30 seconds)   0 - Sitting, lift foot (dominant side, lift foot 1 inch twice)    2 - Pick up object from behind (object at midline, hands breadth posterior)  3 - Forward reach (use dominant arm, must complete full motion) 2 - Lateral reach (use dominant arm, clear opposite ischial tuberosity) 2 - Pick up object from floor (from between feet)   2 - Posterior scooting (move backwards 2 inches)  2 - Anterior scooting (move forward 2 inches)  2 - Lateral scooting (move to dominant side 2 inches)    TOTAL = 39/56  Notes/comments: slide board tranfer to/from table   MCD > 5 points MCID for IP REHAB > 6 points  Function In Sitting Test (FIST) 08/14/23 (1/2 femur on surface; hips/knees flexed to 90deg)   - indicate bed or mat  table / step stool if used  SCORING KEY: 4 = Independent (completes task independently & successfully) 3 = Verbal Cues/Increased Time (completes task independently & successfully and only needs more time/cues) 2 = Upper Extremity Support (must use UE for support or assistance to complete successfully) 1 = Needs Assistance (unable to complete w/o physical assist; DOCUMENT LEVEL: min, mod, max) 0 = Dependent (requires complete physical assist; unable to complete successfully even w/  physical assist)  Randomly Administer Once Throughout Exam  4 - Anterior Nudge (superior sternum)  4 - Posterior Nudge (between scapular spines)  4 - Lateral Nudge (to dominant side at acromion)     4 - Static sitting (30 seconds)  4 - Sitting, shake 'no' (left and right)  4 - Sitting, eyes closed (30 seconds)   0 - Sitting, lift foot (dominant side, lift foot 1 inch twice)    4 - Pick up object from behind (object at midline, hands breadth posterior)  4 - Forward reach (use dominant arm, must complete full motion) 4 - Lateral reach (use dominant arm, clear opposite ischial tuberosity) 2 - Pick up object from floor (from between feet)   3 - Posterior scooting (move backwards 2 inches)  3 - Anterior scooting (move forward 2 inches)  3 - Lateral scooting (move to dominant side 2 inches)    TOTAL = 47/56    MCD > 5 points MCID for IP REHAB > 6 points                                                                                                                              TREATMENT DATE: 11/08/23  There.Act: In // bars with gait belt and CGA: - sit to stand attempts x3- unsuccessful due to L LE trembling, pt states I just can't do it today, my legs aren't working  Isometrics: - hip flexion x15 BLE - hip abduction x15 BLE - hip adduction x15 BLE  Thorough rest breaks provided during session to prevent fatigue   NMR: Multiple reps of the following: Seated twist to place balls from R side into basketball hoop on L side  Seated twist to place balls from L side into basketball hoop on R side   Seated on dynadisc: -static sit 30 seconds x 2 trials -medial/lateral weight shift 15x -anterior/posterior weight shift 15x - hand clap for coordination and spatial awareness 1x2 minutes- pt closed eyes for last minute of exercise   PATIENT EDUCATION: Education details: Pt educated throughout session about proper posture and technique with exercises. Improved exercise  technique, movement at target joints, use of target muscles after min to mod verbal, visual, tactile cues.  Person educated: Patient Education method: Explanation, Demonstration, Tactile cues, and Verbal cues Education comprehension: verbalized understanding, returned demonstration, verbal cues required, tactile cues required, and needs further education  HOME EXERCISE PROGRAM: Stand 3x/day in stander   GOALS: Goals reviewed with patient? Yes  SHORT TERM GOALS: Target  date: 08/08/2023    Patient will be independent in home exercise program to improve strength/mobility for better functional independence with ADLs.  Baseline: 4/8: compliance Goal status: MET    LONG TERM GOALS: Target date: 12/27/2023   Patient will tolerate five consecutive stands with UE support from wheelchair to standing to improve functional mobility.  Baseline: unable to perform 4/8:unable to perform full stand 5/20: perform next session 5/29: two full stands with CGA and cushion on her seat Goal status: Ongoing   2.  Patient will ambulate 10 ft with bRW with wheelchair follow.  Baseline:  unable to ambulate 4/8: unable to ambulate 5/20: able to ambulate length of // bars in previous session 5/27: ambulate 2.5 lengths of // bars with two seated rest breaks with min A for weight shift and wheelchair follow  Goal status: partially met  3.  Patient will improve FIST score >6 points to demonstrate improved stability and ability to perform ADLs.  Baseline:  3/6: 39/56 4/8: 47/ 56 5/20: 48/56 Goal status: MET  4.  Patient will perform toileting with mod I at home for improved independence.  Baseline: 3/6: requires assistance 4/8: requires dependence on machine 5/20: requires assistance with use of wipe buddy.  Goal status: Partially Met    ASSESSMENT:  CLINICAL IMPRESSION: Pt was unable to perform standing tasks today due to fatigue, but pt was able to perform seated reaching tasks and dynamic core  stabilization tasks well. With dynamic reaching tasks, pt had increased difficulty utilizing L hand to grasp foam balls and maintain grasp. Pt was able to perform hand-clapping on Dynadisc with her eyes closed for 1 minute, showing improved overall coordination and proprioception. Patient will benefit from skilled physical therapy to improve mobility, stability, and independence.   OBJECTIVE IMPAIRMENTS: Abnormal gait, cardiopulmonary status limiting activity, decreased activity tolerance, decreased balance, decreased coordination, decreased endurance, decreased mobility, difficulty walking, decreased ROM, decreased strength, hypomobility, increased fascial restrictions, impaired perceived functional ability, impaired flexibility, impaired sensation, improper body mechanics, and postural dysfunction.   ACTIVITY LIMITATIONS: carrying, lifting, bending, sitting, standing, squatting, sleeping, stairs, transfers, bed mobility, continence, bathing, toileting, dressing, self feeding, reach over head, hygiene/grooming, locomotion level, and caring for others  PARTICIPATION LIMITATIONS: meal prep, cleaning, laundry, interpersonal relationship, driving, shopping, community activity, and church  PERSONAL FACTORS: Age, Past/current experiences, Time since onset of injury/illness/exacerbation, Transportation, and 3+ comorbidities: ack pain, CBP, chronic L shoulder pain, galactorrhea, neuropathy, HPV, hypercholestermeia, HTN, MS, osteopenia, PONV, wrist fracture are also affecting patient's functional outcome.   REHAB POTENTIAL: Good  CLINICAL DECISION MAKING: Evolving/moderate complexity  EVALUATION COMPLEXITY: Moderate  PLAN:  PT FREQUENCY: 2x/week  PT DURATION: 12 weeks  PLANNED INTERVENTIONS: 97164- PT Re-evaluation, 97110-Therapeutic exercises, 97530- Therapeutic activity, 97112- Neuromuscular re-education, 97535- Self Care, [id number]- Manual therapy, [phone]- Gait training, [phone]- Orthotic Fit/training,  [phone]- Canalith repositioning, [id number]- Aquatic Therapy, [phone]- Electrical stimulation (unattended), [phone]- Electrical stimulation (manual), [id number]- Ultrasound, [id number]- Traction (mechanical), Patient/Family education, Balance training, Stair training, Taping, Dry Needling, Joint mobilization, Joint manipulation, Spinal manipulation, Spinal mobilization, Scar mobilization, Compression bandaging, Vestibular training, Visual/preceptual remediation/compensation, Cognitive remediation, DME instructions, Cryotherapy, Moist heat, and Biofeedback  PLAN FOR NEXT SESSION: sit to stands; try supported walking, continue dynamic core stabilization training and seated reaching tasks to improve weight shifting.   Geneva Barrero, SPT  This entire session was performed under direct supervision and direction of a licensed therapist/therapist assistant . I have personally read, edited and approve of the note as written.   Darryle Patten PT,  DPT  Physical Therapist - [facility]  Outpatient Physical Therapy- Main Campus [phone]    11/08/2023, 2:09 PM

## 2023-11-11 NOTE — Therapy (Signed)
 OUTPATIENT OCCUPATIONAL THERAPY NEURO TREATMENT NOTE   Patient Name: Lisa Williams MRN: [id number] DOB:08-Sep-1960, 63 y.o., female Today's Date: 11/11/2023  PCP: Dr. Layman Williams   Neurologist Dr. Suanne at [facility] REFERRING PROVIDER: Dr. Layman Williams    END OF SESSION:  OT End of Session - 11/11/23 1839     Visit Number 19    Number of Visits 24    Date for OT Re-Evaluation 11/08/23    Authorization Time Period Reporting period beginning 09/28/23    Progress Note Due on Visit 20    OT Start Time 1145    OT Stop Time 1230    OT Time Calculation (min) 45 min    Equipment Utilized During Treatment manual wc    Activity Tolerance Patient tolerated treatment well    Behavior During Therapy [facility] for tasks assessed/performed         Past Medical History:  Diagnosis Date   Abdominal pain, right upper quadrant    Back pain    Calculus of kidney 12/09/2013   Chronic back pain    unspecified   Chronic left shoulder pain 07/19/2015   Complication of anesthesia    Functional disorder of bladder    other   Galactorrhea 11/26/2014   Chronic    Hereditary and idiopathic neuropathy 08/19/2013   History of kidney stones    HPV test positive    Hypercholesteremia 08/19/2013   Hypertension    Incomplete bladder emptying    Microscopic hematuria    MS (multiple sclerosis) (HCC)    Muscle spasticity 05/21/2014   Nonspecific findings on examination of urine    other   Osteopenia    PONV (postoperative nausea and vomiting)    Status post laparoscopic supracervical hysterectomy 11/26/2014   Tobacco user 11/26/2014   Wrist fracture    Past Surgical History:  Procedure Laterality Date   bilateral tubal ligation  1996   BREAST CYST EXCISION Left 2002   CYST EXCISION Left 05/10/2022   Procedure: CYST REMOVAL;  Surgeon: Lisa Romano, MD;  Location: [facility] ORS;  Service: General;  Laterality: Left;   FRACTURE SURGERY     KNEE SURGERY     right   LAPAROSCOPIC  SUPRACERVICAL HYSTERECTOMY  08/05/2013   ORIF WRIST FRACTURE Left 01/17/2017   Procedure: OPEN REDUCTION INTERNAL FIXATION (ORIF) WRIST FRACTURE;  Surgeon: Lisa Lynwood SAUNDERS, MD;  Location: [facility] ORS;  Service: Orthopedics;  Laterality: Left;   RADIOLOGY WITH ANESTHESIA N/A 03/18/2020   Procedure: MRI WITH ANESTHESIA CERVICAL SPINE AND BRAIN  WITH AND WITHOUT CONTRAST;  Surgeon: Lisa Williams, Medication, MD;  Location: [facility] OR;  Service: Radiology;  Laterality: N/A;   TUBAL LIGATION Bilateral    VAGINAL HYSTERECTOMY  03/2006   Patient Active Problem List   Diagnosis Date Noted   Sinus tachycardia 07/07/2023   Hypokalemia 07/29/2022   Weakness of both lower extremities 07/29/2022   Abscess of left groin 11/15/2021   Abnormal LFTs 11/15/2021   Acute respiratory disease due to COVID-19 virus 11/21/2020   Weakness    Hypoalbuminemia due to protein-calorie malnutrition [facility])    Neurogenic bowel    Neurogenic bladder    Labile blood pressure    Neuropathic pain    Abscess of female pelvis    SVT (supraventricular tachycardia) (HCC)    Radial styloid tenosynovitis 03/12/2018   Wheelchair dependence 02/27/2018   Localized osteoporosis with current pathological fracture with routine healing 01/19/2017   Wrist fracture 01/16/2017   Sprain of ankle 03/23/2016   Closed  fracture of lateral malleolus 03/16/2016   Health care maintenance 01/24/2016   Blood pressure elevated without history of HTN 10/25/2015   Essential hypertension 10/25/2015   Multiple sclerosis (HCC) 10/02/2015   Chronic left shoulder pain 07/19/2015   Multiple sclerosis exacerbation (HCC) 07/14/2015   MS (multiple sclerosis) (HCC) 11/26/2014   Increased body mass index 11/26/2014   HPV test positive 11/26/2014   Status post laparoscopic supracervical hysterectomy 11/26/2014   Galactorrhea 11/26/2014   Back ache 05/21/2014   Adiposity 05/21/2014   Disordered sleep 05/21/2014   Muscle spasticity 05/21/2014   Spasticity  05/21/2014   Calculus of kidney 12/09/2013   Renal colic 12/09/2013   Hypercholesteremia 08/19/2013   Hereditary and idiopathic neuropathy 08/19/2013   Hypercholesterolemia without hypertriglyceridemia 08/19/2013   Bladder infection, chronic 07/25/2012   Disorder of bladder function 07/25/2012   Incomplete bladder emptying 07/25/2012   Microscopic hematuria 07/25/2012   Right upper quadrant pain 07/25/2012   ONSET DATE: 5/24 Amon and broke R leg which caused beginning of ADL decline; diagnosed with MS in 1995)  REFERRING DIAG: MS  THERAPY DIAG:  Muscle weakness (generalized)  Multiple sclerosis exacerbation (HCC)  Rationale for Evaluation and Treatment: Rehabilitation  SUBJECTIVE:  SUBJECTIVE STATEMENT: Pt reports increased confidence with reaching without support of her wc arm rests.  Pt accompanied by: self  PERTINENT HISTORY: Pt reports increased difficulty with basic self care tasks since breaking her R leg last May.  Since that time, mobility and ADLs have declined, with pt requiring assist from private caregivers and family to manage bathing, toileting, dressing, and functional transfers.  PRECAUTIONS: Fall  WEIGHT BEARING RESTRICTIONS: No  PAIN: 11/08/23: 4/10 pain low back, 3/10 pain L shoulder Are you having pain? No; occasional pain in R lower back, but not today  FALLS: Has patient fallen in last 6 months? No Last fall was May 17 of 2024  LIVING ENVIRONMENT: Lives with: lives with their family (including mother who has dementia and sister [name])  Lives in: 2 level home but pt resides on main level Stairs: ramp  Has following equipment at home: Wheelchair (manual), Shower bench, and hand held shower shower, 1 grab bar in the shower, 3in1 commode, sit to stand lift (electric), FWW, hospital bed  PLOF: modified indep with ADL/IADLs prior to May of 7975   PATIENT GOALS: Increase independence with basic self care tasks  OBJECTIVE:  Note: Objective measures were  completed at Evaluation unless otherwise noted.  HAND DOMINANCE: Right  ADLs:  Overall ADLs: assist provided from paid caregiver and sister Transfers/ambulation related to ADLs: sit to stand lift for all transfers, set up for sliding board in/out of bed  Eating: indep  Grooming: modified indep (wc level in mom's bathroom)  UB Dressing: set up (can't access her bedroom closet)  LB Dressing: able to sit on side of bed to don all LB clothing; assist to hike pants/underwear Toileting: assist with clothing management d/t standing within electric lift Bathing: bed bath with sister helping to wash backside Tub Shower transfers: N/A; pt has walk in shower in mom's bathroom (wc does fit in this bathroom but pt reports inability to transfer from wc and requires arm rests on a bench for a successful transfer attempt from any DME).  Tub bench is in pt's bathroom, but lift does not fit through bathroom doorway and pt reports inability to transfer up from tub bench (currently unable to manage either transfer)   Equipment: see above  IADLs: Shopping: [employer] for shopping  Light housekeeping: modified indep from wc level Meal Prep: modified indep from wc level Community mobility: relies on community transportation or family members Medication management: indep Landscape architect: indep Handwriting: NT; pt denies any FMC challenges  MOBILITY STATUS: Hx of falls  POSTURE COMMENTS:  Rounded shoulders, forward head, anterior pelvic tilt, and weight shift left   ACTIVITY TOLERANCE: Activity tolerance: Per PT; currently standing 30-60 sec within Light Gait harness/lift; currently non-ambulatory  UPPER EXTREMITY ROM:  BUEs WFL  UPPER EXTREMITY MMT:     MMT Right eval Left eval  Shoulder flexion 4+ 4  Shoulder abduction 4+ 4  Shoulder adduction    Shoulder extension    Shoulder internal rotation 4+ 4+  Shoulder external rotation 4 4  Middle trapezius    Lower trapezius    Elbow flexion  4+ 4+  Elbow extension 4+ 4+  Wrist flexion 4+ 4+  Wrist extension 4+ 4+  Wrist ulnar deviation    Wrist radial deviation    Wrist pronation    Wrist supination    (Blank rows = not tested)  HAND FUNCTION/COORDINATION:  R/L WNL; pt denies any coordination deficits/ able to manipulate pills/clothing fasteners/etc without difficulty  SENSATION: WFL  EDEMA: No visible edema in BUEs  MUSCLE TONE: BUEs WNL  COGNITION: Overall cognitive status: Within functional limits for tasks assessed  VISION: wears glasses all the time, no reports of diplopia  PERCEPTION: Not tested  PRAXIS: WFL  OBSERVATIONS:  Pt pleasant, cooperative, and motivated to work towards improving indep with ADLs.                                                                                                     TREATMENT DATE: 11/08/23 Therapeutic Activity: -Core stability: promoted forward and lateral (R/L) reaching outside BOS with return to midline without UE support (wc arm rests off) with participation in card sorting on table top; wc positioned several feet from table top to promote reach outside BOS.  Pt practiced contralateral and ipsilateral reaching and alternated arms frequently to reduce fatigue.   Self Care: -STS trials x7 reps in prep for functional transfers; focus on anterior WS and clearing buttocks from wc.  Min vc for foot positioning and engaging quads instead of relying only on BUEs for ascent.  PATIENT EDUCATION: Education details: Core stability exercises, STS technique Person educated: Patient Education method: Explanation, demo, vc Education comprehension: verbalized understanding, demonstrated understanding, further training needed  HOME EXERCISE PROGRAM: IR A/AAROM to promote shoulder flexibility for peri care/bathing  GOALS: Goals reviewed with patient? Yes  SHORT TERM GOALS: Target date: 09/27/23  Pt will perform bed bath with set up.  Baseline: Eval: Min-mod A for posterior  washing; 09/28/23: Min A for posterior washing  Goal status: in progress  2.  Pt will utilize sliding board for wc<>drop arm commode transfer with SBA. Baseline: Eval: Currently using electric lift for transfer to [facility]; 09/28/23: Not yet completed at home but transfers to commode have been easier without sliding board d/t pt implementing a scoot pivot. Goal status: d/c (pt does not prefer sliding  board for commode transfer)  3.  Pt will be indep to perform HEP for maintaining BUE strength for ADLs and functional transfers. Baseline: Eval: HEP not yet initiated; 09/28/23: Pt is working on core stability exercises from wc, cane stretches for bilat shoulder IR, and tricep extension via wc pushups Goal status: indep  LONG TERM GOALS: Target date: 11/08/23  Pt will perform sink bath with min A. Baseline: Eval: Currently bed bathing d/t inability to stand at sink without electric lift (lift does not fit into pt's bathroom); 09/28/23: still completing bed bath as pt is not yet able to stand  Goal status: ongoing  2.  Pt will perform wc<>tub bench transfer with min A. Baseline: Eval: Unable; 09/28/23: Performed in OT clinic with min A, but not yet in the home Goal status: ongoing  3.  Pt will perform squat pivot transfer wc<>BSC with SBA. Baseline: Eval: Eval: Currently using electric lift for transfer to [facility]; 09/28/23: Not yet completed in clinic with [facility], and using lift for BSC in the home still Goal status: ongoing  4. Pt will perform scoot transfer wc<>toilet using grab bars with supv to allow toileting in community setting.  Baseline: Eval: Pt limits community outings d/t requiring use of lift for [facility] transfers; 09/28/23: Pt has performed 2 successful scoot pivot  Transfers to toilet in OT clinic with min A, extra time.  Further trials with clothing management on toilet needed, but pt can lower and hike  pants while sititng edge of mat via lateral leaning and min A to maintain core stability.  Goal  status: ongoing  5.  Pt will tolerate standing x1 min with close supv and BUE support to manage clothing in prep for toileting. Baseline: Eval: Currently standing in electric lift only, or within parallel bars without lift; 09/28/23: Not yet attempted Goal status: in progress  ASSESSMENT: CLINICAL IMPRESSION: Pt reports increased confidence with forward and lateral reaching without support of her arm rests, acknowledging that she feels stronger and able to better control her reaching.  Pt able to perform partial STS from wc level achieving ~30-40% of a full ascent with vc only on 3 of 7 trials. Pt will continue to benefit from skilled OT to work towards above noted goals in OT poc, working to maximize indep with daily tasks while reducing burden of care on caregivers.   PERFORMANCE DEFICITS: in functional skills including ADLs, IADLs, strength, pain, flexibility, Gross motor control, mobility, balance, body mechanics, endurance, and decreased knowledge of use of DME, and psychosocial skills including coping strategies, environmental adaptation, habits, and routines and behaviors.   IMPAIRMENTS: are limiting patient from ADLs, IADLs, and social participation.   CO-MORBIDITIES: has co-morbidities such as neuropathy, back pain, obesity, MS, HTN that affects occupational performance. Patient will benefit from skilled OT to address above impairments and improve overall function.  MODIFICATION OR ASSISTANCE TO COMPLETE EVALUATION: No modification of tasks or assist necessary to complete an evaluation.  OT OCCUPATIONAL PROFILE AND HISTORY: Detailed assessment: Review of records and additional review of physical, cognitive, psychosocial history related to current functional performance.  CLINICAL DECISION MAKING: Moderate - several treatment options, min-mod task modification necessary  REHAB POTENTIAL: Good  EVALUATION COMPLEXITY: Moderate    PLAN:  OT FREQUENCY: 2x/week  OT DURATION: 12  weeks  PLANNED INTERVENTIONS: 97168 OT Re-evaluation, 97535 self care/ADL training, 02889 therapeutic exercise, 97530 therapeutic activity, 97112 neuromuscular re-education, 97140 manual therapy, 97116 gait training, 02989 moist heat, 97010 cryotherapy, balance training, functional mobility training,  psychosocial skills training, energy conservation, coping strategies training, patient/family education, and DME and/or AE instructions  RECOMMENDED OTHER SERVICES: None at this time (Pt currently receiving PT services in this clinic)  CONSULTED AND AGREED WITH PLAN OF CARE: Patient  PLAN FOR NEXT SESSION: see above  Inocente Blazing, MS, OTR/L  Inocente MARLA Blazing, OT 11/11/2023, 6:40 PM

## 2023-11-13 ENCOUNTER — Ambulatory Visit

## 2023-11-13 ENCOUNTER — Ambulatory Visit: Payer: PPO

## 2023-11-13 DIAGNOSIS — M6281 Muscle weakness (generalized): Secondary | ICD-10-CM

## 2023-11-13 DIAGNOSIS — R278 Other lack of coordination: Secondary | ICD-10-CM

## 2023-11-13 DIAGNOSIS — R2681 Unsteadiness on feet: Secondary | ICD-10-CM

## 2023-11-13 DIAGNOSIS — R262 Difficulty in walking, not elsewhere classified: Secondary | ICD-10-CM

## 2023-11-13 DIAGNOSIS — R2689 Other abnormalities of gait and mobility: Secondary | ICD-10-CM

## 2023-11-13 DIAGNOSIS — G35 Multiple sclerosis: Secondary | ICD-10-CM

## 2023-11-13 NOTE — Therapy (Unsigned)
 OUTPATIENT OCCUPATIONAL THERAPY NEURO TREATMENT NOTE   Patient Name: Lisa Williams MRN: [id number] DOB:07-05-1960, 63 y.o., female Today's Date: 11/13/2023  PCP: Dr. Layman Piety   Neurologist Dr. Suanne at [facility] REFERRING PROVIDER: Dr. Layman Piety    END OF SESSION:  OT End of Session - 11/13/23 1118     Visit Number 20    Number of Visits 24         Past Medical History:  Diagnosis Date   Abdominal pain, right upper quadrant    Back pain    Calculus of kidney 12/09/2013   Chronic back pain    unspecified   Chronic left shoulder pain 07/19/2015   Complication of anesthesia    Functional disorder of bladder    other   Galactorrhea 11/26/2014   Chronic    Hereditary and idiopathic neuropathy 08/19/2013   History of kidney stones    HPV test positive    Hypercholesteremia 08/19/2013   Hypertension    Incomplete bladder emptying    Microscopic hematuria    MS (multiple sclerosis) (HCC)    Muscle spasticity 05/21/2014   Nonspecific findings on examination of urine    other   Osteopenia    PONV (postoperative nausea and vomiting)    Status post laparoscopic supracervical hysterectomy 11/26/2014   Tobacco user 11/26/2014   Wrist fracture    Past Surgical History:  Procedure Laterality Date   bilateral tubal ligation  1996   BREAST CYST EXCISION Left 2002   CYST EXCISION Left 05/10/2022   Procedure: CYST REMOVAL;  Surgeon: Rodolph Romano, MD;  Location: [facility] ORS;  Service: General;  Laterality: Left;   FRACTURE SURGERY     KNEE SURGERY     right   LAPAROSCOPIC SUPRACERVICAL HYSTERECTOMY  08/05/2013   ORIF WRIST FRACTURE Left 01/17/2017   Procedure: OPEN REDUCTION INTERNAL FIXATION (ORIF) WRIST FRACTURE;  Surgeon: Leora Lynwood SAUNDERS, MD;  Location: [facility] OR[facility];  Service: Orthopedics;  Laterality: Left;   RADIOLOGY WITH ANESTHESIA N/A 03/18/2020   Procedure: MRI WITH ANESTHESIA CERVICAL SPINE AND BRAIN  WITH AND WITHOUT CONTRAST;  Surgeon:  Radiologist, Medication, MD;  Location: [facility] OR;  Service: Radiology;  Laterality: N/A;   TUBAL LIGATION Bilateral    VAGINAL HYSTERECTOMY  03/2006   Patient Active Problem List   Diagnosis Date Noted   Sinus tachycardia 07/07/2023   Hypokalemia 07/29/2022   Weakness of both lower extremities 07/29/2022   Abscess of left groin 11/15/2021   Abnormal LFTs 11/15/2021   Acute respiratory disease due to COVID-19 virus 11/21/2020   Weakness    Hypoalbuminemia due to protein-calorie malnutrition [facility])    Neurogenic bowel    Neurogenic bladder    Labile blood pressure    Neuropathic pain    Abscess of female pelvis    SVT (supraventricular tachycardia) (HCC)    Radial styloid tenosynovitis 03/12/2018   Wheelchair dependence 02/27/2018   Localized osteoporosis with current pathological fracture with routine healing 01/19/2017   Wrist fracture 01/16/2017   Sprain of ankle 03/23/2016   Closed fracture of lateral malleolus 03/16/2016   Health care maintenance 01/24/2016   Blood pressure elevated without history of HTN 10/25/2015   Essential hypertension 10/25/2015   Multiple sclerosis (HCC) 10/02/2015   Chronic left shoulder pain 07/19/2015   Multiple sclerosis exacerbation (HCC) 07/14/2015   MS (multiple sclerosis) (HCC) 11/26/2014   Increased body mass index 11/26/2014   HPV test positive 11/26/2014   Status post laparoscopic supracervical hysterectomy 11/26/2014   Galactorrhea 11/26/2014  Back ache 05/21/2014   Adiposity 05/21/2014   Disordered sleep 05/21/2014   Muscle spasticity 05/21/2014   Spasticity 05/21/2014   Calculus of kidney 12/09/2013   Renal colic 12/09/2013   Hypercholesteremia 08/19/2013   Hereditary and idiopathic neuropathy 08/19/2013   Hypercholesterolemia without hypertriglyceridemia 08/19/2013   Bladder infection, chronic 07/25/2012   Disorder of bladder function 07/25/2012   Incomplete bladder emptying 07/25/2012   Microscopic hematuria 07/25/2012   Right  upper quadrant pain 07/25/2012   ONSET DATE: 5/24 Amon and broke R leg which caused beginning of ADL decline; diagnosed with MS in 1995)  REFERRING DIAG: MS  THERAPY DIAG:  No diagnosis found.  Rationale for Evaluation and Treatment: Rehabilitation  SUBJECTIVE:  SUBJECTIVE STATEMENT: Pt reports increased confidence with reaching without support of her wc arm rests.  Pt accompanied by: self  PERTINENT HISTORY: Pt reports increased difficulty with basic self care tasks since breaking her R leg last May.  Since that time, mobility and ADLs have declined, with pt requiring assist from private caregivers and family to manage bathing, toileting, dressing, and functional transfers.  PRECAUTIONS: Fall  WEIGHT BEARING RESTRICTIONS: No  PAIN: 11/13/23: No pain Are you having pain? No; occasional pain in R lower back, but not today  FALLS: Has patient fallen in last 6 months? No Last fall was May 17 of 2024  LIVING ENVIRONMENT: Lives with: lives with their family (including mother who has dementia and sister [name])  Lives in: 2 level home but pt resides on main level Stairs: ramp  Has following equipment at home: Wheelchair (manual), Shower bench, and hand held shower shower, 1 grab bar in the shower, 3in1 commode, sit to stand lift (electric), FWW, hospital bed  PLOF: modified indep with ADL/IADLs prior to May of 7975   PATIENT GOALS: Increase independence with basic self care tasks  OBJECTIVE:  Note: Objective measures were completed at Evaluation unless otherwise noted.  HAND DOMINANCE: Right  ADLs:  Overall ADLs: assist provided from paid caregiver and sister Transfers/ambulation related to ADLs: sit to stand lift for all transfers, set up for sliding board in/out of bed  Eating: indep  Grooming: modified indep (wc level in mom's bathroom)  UB Dressing: set up (can't access her bedroom closet)  LB Dressing: able to sit on side of bed to don all LB clothing; assist to hike  pants/underwear Toileting: assist with clothing management d/t standing within electric lift Bathing: bed bath with sister helping to wash backside Tub Shower transfers: N/A; pt has walk in shower in mom's bathroom (wc does fit in this bathroom but pt reports inability to transfer from wc and requires arm rests on a bench for a successful transfer attempt from any DME).  Tub bench is in pt's bathroom, but lift does not fit through bathroom doorway and pt reports inability to transfer up from tub bench (currently unable to manage either transfer)   Equipment: see above  IADLs: Shopping: [employer] for shopping Light housekeeping: modified indep from wc level Meal Prep: modified indep from wc level Community mobility: relies on community transportation or family members Medication management: indep Landscape architect: indep Handwriting: NT; pt denies any FMC challenges  MOBILITY STATUS: Hx of falls  POSTURE COMMENTS:  Rounded shoulders, forward head, anterior pelvic tilt, and weight shift left   ACTIVITY TOLERANCE: Activity tolerance: Per PT; currently standing 30-60 sec within Light Gait harness/lift; currently non-ambulatory  UPPER EXTREMITY ROM:  BUEs WFL  UPPER EXTREMITY MMT:     MMT  Right eval Left eval  Shoulder flexion 4+ 4  Shoulder abduction 4+ 4  Shoulder adduction    Shoulder extension    Shoulder internal rotation 4+ 4+  Shoulder external rotation 4 4  Middle trapezius    Lower trapezius    Elbow flexion 4+ 4+  Elbow extension 4+ 4+  Wrist flexion 4+ 4+  Wrist extension 4+ 4+  Wrist ulnar deviation    Wrist radial deviation    Wrist pronation    Wrist supination    (Blank rows = not tested)  HAND FUNCTION/COORDINATION:  R/L WNL; pt denies any coordination deficits/ able to manipulate pills/clothing fasteners/etc without difficulty  SENSATION: WFL  EDEMA: No visible edema in BUEs  MUSCLE TONE: BUEs WNL  COGNITION: Overall cognitive status:  Within functional limits for tasks assessed  VISION: wears glasses all the time, no reports of diplopia  PERCEPTION: Not tested  PRAXIS: WFL  OBSERVATIONS:  Pt pleasant, cooperative, and motivated to work towards improving indep with ADLs.                                                                                                     TREATMENT DATE: 11/08/23 Therapeutic Activity: -Core stability: promoted forward and lateral (R/L) reaching outside BOS with return to midline without UE support (wc arm rests off) with participation in card sorting on table top; wc positioned several feet from table top to promote reach outside BOS.  Pt practiced contralateral and ipsilateral reaching and alternated arms frequently to reduce fatigue.   Self Care: -STS trials x7 reps in prep for functional transfers; focus on anterior WS and clearing buttocks from wc.  Min vc for foot positioning and engaging quads instead of relying only on BUEs for ascent.  PATIENT EDUCATION: Education details: Core stability exercises, STS technique Person educated: Patient Education method: Explanation, demo, vc Education comprehension: verbalized understanding, demonstrated understanding, further training needed  HOME EXERCISE PROGRAM: IR A/AAROM to promote shoulder flexibility for peri care/bathing  GOALS: Goals reviewed with patient? Yes  SHORT TERM GOALS: Target date: 09/27/23  Pt will perform bed bath with set up.  Baseline: Eval: Min-mod A for posterior washing; 09/28/23: Min A for posterior washing; 11/13/23: Able to manage but sister helps with thoroughness Goal status: in progress  2.  Pt will utilize sliding board for wc<>drop arm commode transfer with SBA. Baseline: Eval: Currently using electric lift for transfer to [facility]; 09/28/23: Not yet completed at home but transfers to commode have been easier without sliding board d/t pt implementing a scoot pivot. Goal status: d/c (pt does not prefer sliding board  for commode transfer)  3.  Pt will be indep to perform HEP for maintaining BUE strength for ADLs and functional transfers. Baseline: Eval: HEP not yet initiated; 09/28/23: Pt is working on core stability exercises from wc, cane stretches for bilat shoulder IR, and tricep extension via wc pushups Goal status: indep  LONG TERM GOALS: Target date: 11/08/23  Pt will perform sink bath with min A. Baseline: Eval: Currently bed bathing d/t inability to stand at sink without electric  lift (lift does not fit into pt's bathroom); 09/28/23: still completing bed bath as pt is not yet able to stand  Goal status: ongoing  2.  Pt will perform wc<>tub bench transfer with min A. Baseline: Eval: Unable; 09/28/23: Performed in OT clinic with min A, but not yet in the home Goal status: ongoing  3.  Pt will perform squat pivot transfer wc<>BSC with SBA.  Baseline: Eval: Eval: Currently using electric lift for transfer to [facility]; 09/28/23: Not yet completed in clinic with [facility], and using lift for [facility] in the home still; 11/13/23: Still using Camie lift at home  Goal status: ongoing  4. Pt will perform scoot transfer wc<>toilet using grab bars with supv to allow toileting in community setting.  Baseline: Eval: Pt limits community outings d/t requiring use of lift for [facility] transfers; 09/28/23: Pt has performed 2 successful scoot pivot  Transfers to toilet in OT clinic with min A, extra time.  Further trials with clothing management on toilet needed, but pt can lower and hike  pants while sititng edge of mat via lateral leaning and min A to maintain core stability.  Goal status: ongoing  5.  Pt will tolerate standing x1 min with close supv and BUE support to manage clothing in prep for toileting. Baseline: Eval: Currently standing in electric lift only, or within parallel bars without lift; 09/28/23: Not yet attempted Goal status: in progress    ASSESSMENT: CLINICAL IMPRESSION: Pt reports increased confidence with forward  and lateral reaching without support of her arm rests, acknowledging that she feels stronger and able to better control her reaching.  Pt able to perform partial STS from wc level achieving ~30-40% of a full ascent with vc only on 3 of 7 trials. Pt will continue to benefit from skilled OT to work towards above noted goals in OT poc, working to maximize indep with daily tasks while reducing burden of care on caregivers.   PERFORMANCE DEFICITS: in functional skills including ADLs, IADLs, strength, pain, flexibility, Gross motor control, mobility, balance, body mechanics, endurance, and decreased knowledge of use of DME, and psychosocial skills including coping strategies, environmental adaptation, habits, and routines and behaviors.   IMPAIRMENTS: are limiting patient from ADLs, IADLs, and social participation.   CO-MORBIDITIES: has co-morbidities such as neuropathy, back pain, obesity, MS, HTN that affects occupational performance. Patient will benefit from skilled OT to address above impairments and improve overall function.  MODIFICATION OR ASSISTANCE TO COMPLETE EVALUATION: No modification of tasks or assist necessary to complete an evaluation.  OT OCCUPATIONAL PROFILE AND HISTORY: Detailed assessment: Review of records and additional review of physical, cognitive, psychosocial history related to current functional performance.  CLINICAL DECISION MAKING: Moderate - several treatment options, min-mod task modification necessary  REHAB POTENTIAL: Good  EVALUATION COMPLEXITY: Moderate    PLAN:  OT FREQUENCY: 2x/week  OT DURATION: 12 weeks  PLANNED INTERVENTIONS: 97168 OT Re-evaluation, 97535 self care/ADL training, 02889 therapeutic exercise, 97530 therapeutic activity, 97112 neuromuscular re-education, 97140 manual therapy, 97116 gait training, 02989 moist heat, 97010 cryotherapy, balance training, functional mobility training, psychosocial skills training, energy conservation, coping  strategies training, patient/family education, and DME and/or AE instructions  RECOMMENDED OTHER SERVICES: None at this time (Pt currently receiving PT services in this clinic)  CONSULTED AND AGREED WITH PLAN OF CARE: Patient  PLAN FOR NEXT SESSION: see above  Inocente Blazing, MS, OTR/L  Inocente MARLA Blazing, OT 11/13/2023, 11:19 AM

## 2023-11-13 NOTE — Therapy (Signed)
 OUTPATIENT PHYSICAL THERAPY NEURO TREATMENT       Patient Name: Lisa Williams MRN: [id number] DOB:1961/03/02, 63 y.o., female Today's Date: 11/13/2023   PCP: Lenon Na  REFERRING PROVIDER: Lenon Na  END OF SESSION:  PT End of Session - 11/13/23 1011     Visit Number 33    Number of Visits 47    Date for PT Re-Evaluation 12/27/23   corrected   PT Start Time 1015    PT Stop Time 1057    PT Time Calculation (min) 42 min    Equipment Utilized During Treatment Gait belt    Activity Tolerance Patient tolerated treatment well;No increased pain    Behavior During Therapy [facility] for tasks assessed/performed                                      Past Medical History:  Diagnosis Date   Abdominal pain, right upper quadrant    Back pain    Calculus of kidney 12/09/2013   Chronic back pain    unspecified   Chronic left shoulder pain 07/19/2015   Complication of anesthesia    Functional disorder of bladder    other   Galactorrhea 11/26/2014   Chronic    Hereditary and idiopathic neuropathy 08/19/2013   History of kidney stones    HPV test positive    Hypercholesteremia 08/19/2013   Hypertension    Incomplete bladder emptying    Microscopic hematuria    MS (multiple sclerosis) (HCC)    Muscle spasticity 05/21/2014   Nonspecific findings on examination of urine    other   Osteopenia    PONV (postoperative nausea and vomiting)    Status post laparoscopic supracervical hysterectomy 11/26/2014   Tobacco user 11/26/2014   Wrist fracture    Past Surgical History:  Procedure Laterality Date   bilateral tubal ligation  1996   BREAST CYST EXCISION Left 2002   CYST EXCISION Left 05/10/2022   Procedure: CYST REMOVAL;  Surgeon: Rodolph Romano, MD;  Location: [facility] ORS;  Service: General;  Laterality: Left;   FRACTURE SURGERY     KNEE SURGERY     right   LAPAROSCOPIC SUPRACERVICAL HYSTERECTOMY  08/05/2013   ORIF WRIST  FRACTURE Left 01/17/2017   Procedure: OPEN REDUCTION INTERNAL FIXATION (ORIF) WRIST FRACTURE;  Surgeon: Leora Lynwood SAUNDERS, MD;  Location: [facility] ORS;  Service: Orthopedics;  Laterality: Left;   RADIOLOGY WITH ANESTHESIA N/A 03/18/2020   Procedure: MRI WITH ANESTHESIA CERVICAL SPINE AND BRAIN  WITH AND WITHOUT CONTRAST;  Surgeon: Radiologist, Medication, MD;  Location: [facility] OR;  Service: Radiology;  Laterality: N/A;   TUBAL LIGATION Bilateral    VAGINAL HYSTERECTOMY  03/2006   Patient Active Problem List   Diagnosis Date Noted   Sinus tachycardia 07/07/2023   Hypokalemia 07/29/2022   Weakness of both lower extremities 07/29/2022   Abscess of left groin 11/15/2021   Abnormal LFTs 11/15/2021   Acute respiratory disease due to COVID-19 virus 11/21/2020   Weakness    Hypoalbuminemia due to protein-calorie malnutrition [facility])    Neurogenic bowel    Neurogenic bladder    Labile blood pressure    Neuropathic pain    Abscess of female pelvis    SVT (supraventricular tachycardia) (HCC)    Radial styloid tenosynovitis 03/12/2018   Wheelchair dependence 02/27/2018   Localized osteoporosis with current pathological fracture with routine healing 01/19/2017   Wrist fracture 01/16/2017  Sprain of ankle 03/23/2016   Closed fracture of lateral malleolus 03/16/2016   Health care maintenance 01/24/2016   Blood pressure elevated without history of HTN 10/25/2015   Essential hypertension 10/25/2015   Multiple sclerosis (HCC) 10/02/2015   Chronic left shoulder pain 07/19/2015   Multiple sclerosis exacerbation (HCC) 07/14/2015   MS (multiple sclerosis) (HCC) 11/26/2014   Increased body mass index 11/26/2014   HPV test positive 11/26/2014   Status post laparoscopic supracervical hysterectomy 11/26/2014   Galactorrhea 11/26/2014   Back ache 05/21/2014   Adiposity 05/21/2014   Disordered sleep 05/21/2014   Muscle spasticity 05/21/2014   Spasticity 05/21/2014   Calculus of kidney 12/09/2013   Renal colic  12/09/2013   Hypercholesteremia 08/19/2013   Hereditary and idiopathic neuropathy 08/19/2013   Hypercholesterolemia without hypertriglyceridemia 08/19/2013   Bladder infection, chronic 07/25/2012   Disorder of bladder function 07/25/2012   Incomplete bladder emptying 07/25/2012   Microscopic hematuria 07/25/2012   Right upper quadrant pain 07/25/2012    ONSET DATE: 1995  REFERRING DIAG: MS  THERAPY DIAG:  Muscle weakness (generalized)  Unsteadiness on feet  Difficulty in walking, not elsewhere classified  Other lack of coordination  Other abnormalities of gait and mobility  Rationale for Evaluation and Treatment: Rehabilitation  SUBJECTIVE:                                                                                                                                                                                             SUBJECTIVE STATEMENT: Pt reports she is in a good mood today. Pt denies soreness after previous PT session.   Pt accompanied by: self  PERTINENT HISTORY:  Patient is returning to PT s/p hospitalization.  s/p ORIF of R tibia shaft fracture 09/23/2022. Patient has weakness in BLE with RLE>LLE. She drives with hand controls. Patient has been diagnosed with MS in 1995. PMH includes: back pain, CBP, chronic L shoulder pain, galactorrhea, neuropathy, HPV, hypercholestermeia, HTN, MS, osteopenia, PONV, wrist fracture. Additional order for other closed fracture of proximal end of R tibia with routine healing. Still has to use [employer].   PAIN:  Are you having pain? Occasional pain in RLE; primarily in knee  PRECAUTIONS: Fall  RED FLAGS: None   WEIGHT BEARING RESTRICTIONS: No  FALLS: Has patient fallen in last 6 months? No  LIVING ENVIRONMENT: Lives with: lives with their family Lives in: House/apartment Stairs: ramp Has following equipment at home: Vannie - 2 wheeled, Wheelchair (manual), shower chair, and Grab bars  PLOF: Independent with household  mobility with device  PATIENT GOALS: to get her independence back. To be able to get into  car, toilet, and get dressed independently  OBJECTIVE:  Note: Objective measures were completed at Evaluation unless otherwise noted.  DIAGNOSTIC FINDINGS: MRI of the brain 03/18/2020 showed multiple T2/FLAIR hyperintense foci in the periventricular, juxtacortical and deep white matter.  There were no infratentorial lesions noted.  None of the foci enhanced.   Due to severe claustrophobia, the study was done with conscious sedation in the hospital   COGNITION: Overall cognitive status: Within functional limits for tasks assessed   SENSATION: Lack of sensation in feet, loss in bilateral lateral aspect of knee  COORDINATION: Does not have the strength for functional LE heel slide test     MUSCLE TONE: BLE mild tone    POSTURE: rounded shoulders, forward head, anterior pelvic tilt, and weight shift left    LOWER EXTREMITY MMT:    MMT Right Eval Left Eval  Hip flexion 0.9 1.5  Hip extension    Hip abduction 1.8 2.6  Hip adduction 3.1 1.9  Hip internal rotation    Hip external rotation    Knee flexion 0.9 1.5  Knee extension 0.8 1.2  Ankle dorsiflexion    Ankle plantarflexion    Ankle inversion    Ankle eversion    (Blank rows = not tested)  BED MOBILITY:  Assess in future session due to limited time  TRANSFERS: Assistive device utilized: Bariatric RW with sit to stands, slide board to table  Sit to stand: unable to reach full stand Stand to sit: unable to reach full stand  Chair to chair: slide board with CGA     GAIT: Unable to ambulate at this time.   FUNCTIONAL TESTS:  Sit to stand: tricep press with BUE; unable to bring arm to walker.    Function In Sitting Test (FIST)  (1/2 femur on surface; hips/knees flexed to 90deg)   - indicate bed or mat table / step stool if used  SCORING KEY: 4 = Independent (completes task independently & successfully) 3 = Verbal  Cues/Increased Time (completes task independently & successfully and only needs more time/cues) 2 = Upper Extremity Support (must use UE for support or assistance to complete successfully) 1 = Needs Assistance (unable to complete w/o physical assist; DOCUMENT LEVEL: min, mod, max) 0 = Dependent (requires complete physical assist; unable to complete successfully even w/ physical assist)  Randomly Administer Once Throughout Exam  4 - Anterior Nudge (superior sternum)  4 - Posterior Nudge (between scapular spines)  4 - Lateral Nudge (to dominant side at acromion)     4 - Static sitting (30 seconds)  4 - Sitting, shake 'no' (left and right)  4 - Sitting, eyes closed (30 seconds)   0 - Sitting, lift foot (dominant side, lift foot 1 inch twice)    2 - Pick up object from behind (object at midline, hands breadth posterior)  3 - Forward reach (use dominant arm, must complete full motion) 2 - Lateral reach (use dominant arm, clear opposite ischial tuberosity) 2 - Pick up object from floor (from between feet)   2 - Posterior scooting (move backwards 2 inches)  2 - Anterior scooting (move forward 2 inches)  2 - Lateral scooting (move to dominant side 2 inches)    TOTAL = 39/56  Notes/comments: slide board tranfer to/from table   MCD > 5 points MCID for IP REHAB > 6 points  Function In Sitting Test (FIST) 08/14/23 (1/2 femur on surface; hips/knees flexed to 90deg)   - indicate bed or mat table / step  stool if used  SCORING KEY: 4 = Independent (completes task independently & successfully) 3 = Verbal Cues/Increased Time (completes task independently & successfully and only needs more time/cues) 2 = Upper Extremity Support (must use UE for support or assistance to complete successfully) 1 = Needs Assistance (unable to complete w/o physical assist; DOCUMENT LEVEL: min, mod, max) 0 = Dependent (requires complete physical assist; unable to complete successfully even w/ physical  assist)  Randomly Administer Once Throughout Exam  4 - Anterior Nudge (superior sternum)  4 - Posterior Nudge (between scapular spines)  4 - Lateral Nudge (to dominant side at acromion)     4 - Static sitting (30 seconds)  4 - Sitting, shake 'no' (left and right)  4 - Sitting, eyes closed (30 seconds)   0 - Sitting, lift foot (dominant side, lift foot 1 inch twice)    4 - Pick up object from behind (object at midline, hands breadth posterior)  4 - Forward reach (use dominant arm, must complete full motion) 4 - Lateral reach (use dominant arm, clear opposite ischial tuberosity) 2 - Pick up object from floor (from between feet)   3 - Posterior scooting (move backwards 2 inches)  3 - Anterior scooting (move forward 2 inches)  3 - Lateral scooting (move to dominant side 2 inches)    TOTAL = 47/56    MCD > 5 points MCID for IP REHAB > 6 points                                                                                                                              TREATMENT DATE: 11/13/23  Ther.Act: In // bars with gait belt and CGA: - sit to stand attempts x4- 2 successful attempt with bilateral UE support on // bars- pt able to take one step with R LE - Standing UE taps on SPT shoulder x10 each UE; 2 rounds - Standing weight shifting laterally x multiple reps  Thorough rest breaks provided during session to prevent fatigue   NMR: Multiple reps of the following: Seated twist to place balls from R side into basketball hoop on L side  Seated twist to place balls from L side into basketball hoop on R side   Seated on dynadisc: -static sit 60 seconds x 2 trials -medial/lateral weight shift 15x -anterior/posterior weight shift 15x    PATIENT EDUCATION: Education details: Pt educated throughout session about proper posture and technique with exercises. Improved exercise technique, movement at target joints, use of target muscles after min to mod verbal, visual, tactile  cues.  Person educated: Patient Education method: Explanation, Demonstration, Tactile cues, and Verbal cues Education comprehension: verbalized understanding, returned demonstration, verbal cues required, tactile cues required, and needs further education  HOME EXERCISE PROGRAM: Stand 3x/day in stander   GOALS: Goals reviewed with patient? Yes  SHORT TERM GOALS: Target date: 08/08/2023    Patient will be independent in home exercise program to improve strength/mobility  for better functional independence with ADLs.  Baseline: 4/8: compliance Goal status: MET    LONG TERM GOALS: Target date: 12/27/2023   Patient will tolerate five consecutive stands with UE support from wheelchair to standing to improve functional mobility.  Baseline: unable to perform 4/8:unable to perform full stand 5/20: perform next session 5/29: two full stands with CGA and cushion on her seat Goal status: Ongoing   2.  Patient will ambulate 10 ft with bRW with wheelchair follow.  Baseline:  unable to ambulate 4/8: unable to ambulate 5/20: able to ambulate length of // bars in previous session 5/27: ambulate 2.5 lengths of // bars with two seated rest breaks with min A for weight shift and wheelchair follow  Goal status: partially met  3.  Patient will improve FIST score >6 points to demonstrate improved stability and ability to perform ADLs.  Baseline:  3/6: 39/56 4/8: 47/ 56 5/20: 48/56 Goal status: MET  4.  Patient will perform toileting with mod I at home for improved independence.  Baseline: 3/6: requires assistance 4/8: requires dependence on machine 5/20: requires assistance with use of wipe buddy.  Goal status: Partially Met    ASSESSMENT:  CLINICAL IMPRESSION: Patient able to complete 2 sit-to-stands in session today and performed standing activities well. Pt continues to demonstrate difficulties with grip in L hand affecting her ability to hold smaller objects during reaching tasks. Pt responded  well to education on eventual standing frame use following in-home evaluation completed last week. Patient will benefit from skilled physical therapy to improve mobility, stability, and independence.   OBJECTIVE IMPAIRMENTS: Abnormal gait, cardiopulmonary status limiting activity, decreased activity tolerance, decreased balance, decreased coordination, decreased endurance, decreased mobility, difficulty walking, decreased ROM, decreased strength, hypomobility, increased fascial restrictions, impaired perceived functional ability, impaired flexibility, impaired sensation, improper body mechanics, and postural dysfunction.   ACTIVITY LIMITATIONS: carrying, lifting, bending, sitting, standing, squatting, sleeping, stairs, transfers, bed mobility, continence, bathing, toileting, dressing, self feeding, reach over head, hygiene/grooming, locomotion level, and caring for others  PARTICIPATION LIMITATIONS: meal prep, cleaning, laundry, interpersonal relationship, driving, shopping, community activity, and church  PERSONAL FACTORS: Age, Past/current experiences, Time since onset of injury/illness/exacerbation, Transportation, and 3+ comorbidities: ack pain, CBP, chronic L shoulder pain, galactorrhea, neuropathy, HPV, hypercholestermeia, HTN, MS, osteopenia, PONV, wrist fracture are also affecting patient's functional outcome.   REHAB POTENTIAL: Good  CLINICAL DECISION MAKING: Evolving/moderate complexity  EVALUATION COMPLEXITY: Moderate  PLAN:  PT FREQUENCY: 2x/week  PT DURATION: 12 weeks  PLANNED INTERVENTIONS: 97164- PT Re-evaluation, 97110-Therapeutic exercises, 97530- Therapeutic activity, 97112- Neuromuscular re-education, 97535- Self Care, [id number]- Manual therapy, [phone]- Gait training, [phone]- Orthotic Fit/training, [phone]- Canalith repositioning, [id number]- Aquatic Therapy, [phone]- Electrical stimulation (unattended), [phone]- Electrical stimulation (manual), [id number]- Ultrasound, [id number]- Traction  (mechanical), Patient/Family education, Balance training, Stair training, Taping, Dry Needling, Joint mobilization, Joint manipulation, Spinal manipulation, Spinal mobilization, Scar mobilization, Compression bandaging, Vestibular training, Visual/preceptual remediation/compensation, Cognitive remediation, DME instructions, Cryotherapy, Moist heat, and Biofeedback  PLAN FOR NEXT SESSION: sit to stands; try supported walking, continue dynamic core stabilization training and seated reaching tasks to improve weight shifting.   Afsheen Antony, SPT  This entire session was performed under direct supervision and direction of a licensed therapist/therapist assistant . I have personally read, edited and approve of the note as written.  Marina  Leopoldo, PT, DPT Physical Therapist - [facility]  Outpatient Physical Therapy- Main Campus [phone]     11/13/2023, 2:32 PM

## 2023-11-14 NOTE — Therapy (Signed)
 OUTPATIENT PHYSICAL THERAPY NEURO TREATMENT       Patient Name: Lisa Williams MRN: [id number] DOB:07/20/1960, 63 y.o., female Today's Date: 11/14/2023   PCP: Lenon Na  REFERRING PROVIDER: Lenon Na  END OF SESSION:                                Past Medical History:  Diagnosis Date   Abdominal pain, right upper quadrant    Back pain    Calculus of kidney 12/09/2013   Chronic back pain    unspecified   Chronic left shoulder pain 07/19/2015   Complication of anesthesia    Functional disorder of bladder    other   Galactorrhea 11/26/2014   Chronic    Hereditary and idiopathic neuropathy 08/19/2013   History of kidney stones    HPV test positive    Hypercholesteremia 08/19/2013   Hypertension    Incomplete bladder emptying    Microscopic hematuria    MS (multiple sclerosis) (HCC)    Muscle spasticity 05/21/2014   Nonspecific findings on examination of urine    other   Osteopenia    PONV (postoperative nausea and vomiting)    Status post laparoscopic supracervical hysterectomy 11/26/2014   Tobacco user 11/26/2014   Wrist fracture    Past Surgical History:  Procedure Laterality Date   bilateral tubal ligation  1996   BREAST CYST EXCISION Left 2002   CYST EXCISION Left 05/10/2022   Procedure: CYST REMOVAL;  Surgeon: Rodolph Romano, MD;  Location: [facility];  Service: General;  Laterality: Left;   FRACTURE SURGERY     KNEE SURGERY     right   LAPAROSCOPIC SUPRACERVICAL HYSTERECTOMY  08/05/2013   ORIF WRIST FRACTURE Left 01/17/2017   Procedure: OPEN REDUCTION INTERNAL FIXATION (ORIF) WRIST FRACTURE;  Surgeon: Leora Lynwood SAUNDERS, MD;  Location: [facility];  Service: Orthopedics;  Laterality: Left;   RADIOLOGY WITH ANESTHESIA N/A 03/18/2020   Procedure: MRI WITH ANESTHESIA CERVICAL SPINE AND BRAIN  WITH AND WITHOUT CONTRAST;  Surgeon: Radiologist, Medication, MD;  Location: [facility] OR;  Service: Radiology;   Laterality: N/A;   TUBAL LIGATION Bilateral    VAGINAL HYSTERECTOMY  03/2006   Patient Active Problem List   Diagnosis Date Noted   Sinus tachycardia 07/07/2023   Hypokalemia 07/29/2022   Weakness of both lower extremities 07/29/2022   Abscess of left groin 11/15/2021   Abnormal LFTs 11/15/2021   Acute respiratory disease due to COVID-19 virus 11/21/2020   Weakness    Hypoalbuminemia due to protein-calorie malnutrition [facility])    Neurogenic bowel    Neurogenic bladder    Labile blood pressure    Neuropathic pain    Abscess of female pelvis    SVT (supraventricular tachycardia) (HCC)    Radial styloid tenosynovitis 03/12/2018   Wheelchair dependence 02/27/2018   Localized osteoporosis with current pathological fracture with routine healing 01/19/2017   Wrist fracture 01/16/2017   Sprain of ankle 03/23/2016   Closed fracture of lateral malleolus 03/16/2016   Health care maintenance 01/24/2016   Blood pressure elevated without history of HTN 10/25/2015   Essential hypertension 10/25/2015   Multiple sclerosis (HCC) 10/02/2015   Chronic left shoulder pain 07/19/2015   Multiple sclerosis exacerbation (HCC) 07/14/2015   MS (multiple sclerosis) (HCC) 11/26/2014   Increased body mass index 11/26/2014   HPV test positive 11/26/2014   Status post laparoscopic supracervical hysterectomy 11/26/2014   Galactorrhea 11/26/2014   Back ache 05/21/2014  Adiposity 05/21/2014   Disordered sleep 05/21/2014   Muscle spasticity 05/21/2014   Spasticity 05/21/2014   Calculus of kidney 12/09/2013   Renal colic 12/09/2013   Hypercholesteremia 08/19/2013   Hereditary and idiopathic neuropathy 08/19/2013   Hypercholesterolemia without hypertriglyceridemia 08/19/2013   Bladder infection, chronic 07/25/2012   Disorder of bladder function 07/25/2012   Incomplete bladder emptying 07/25/2012   Microscopic hematuria 07/25/2012   Right upper quadrant pain 07/25/2012    ONSET DATE: 1995  REFERRING  DIAG: MS  THERAPY DIAG:  No diagnosis found.  Rationale for Evaluation and Treatment: Rehabilitation  SUBJECTIVE:                                                                                                                                                                                             SUBJECTIVE STATEMENT: ***  Pt accompanied by: self  PERTINENT HISTORY:  Patient is returning to PT s/p hospitalization.  s/p ORIF of R tibia shaft fracture 09/23/2022. Patient has weakness in BLE with RLE>LLE. She drives with hand controls. Patient has been diagnosed with MS in 1995. PMH includes: back pain, CBP, chronic L shoulder pain, galactorrhea, neuropathy, HPV, hypercholestermeia, HTN, MS, osteopenia, PONV, wrist fracture. Additional order for other closed fracture of proximal end of R tibia with routine healing. Still has to use [employer].   PAIN:  Are you having pain? Occasional pain in RLE; primarily in knee  PRECAUTIONS: Fall  RED FLAGS: None   WEIGHT BEARING RESTRICTIONS: No  FALLS: Has patient fallen in last 6 months? No  LIVING ENVIRONMENT: Lives with: lives with their family Lives in: House/apartment Stairs: ramp Has following equipment at home: Vannie - 2 wheeled, Wheelchair (manual), shower chair, and Grab bars  PLOF: Independent with household mobility with device  PATIENT GOALS: to get her independence back. To be able to get into car, toilet, and get dressed independently  OBJECTIVE:  Note: Objective measures were completed at Evaluation unless otherwise noted.  DIAGNOSTIC FINDINGS: MRI of the brain 03/18/2020 showed multiple T2/FLAIR hyperintense foci in the periventricular, juxtacortical and deep white matter.  There were no infratentorial lesions noted.  None of the foci enhanced.   Due to severe claustrophobia, the study was done with conscious sedation in the hospital   COGNITION: Overall cognitive status: Within functional limits for tasks  assessed   SENSATION: Lack of sensation in feet, loss in bilateral lateral aspect of knee  COORDINATION: Does not have the strength for functional LE heel slide test     MUSCLE TONE: BLE mild tone    POSTURE: rounded shoulders, forward head, anterior pelvic tilt, and weight  shift left    LOWER EXTREMITY MMT:    MMT Right Eval Left Eval  Hip flexion 0.9 1.5  Hip extension    Hip abduction 1.8 2.6  Hip adduction 3.1 1.9  Hip internal rotation    Hip external rotation    Knee flexion 0.9 1.5  Knee extension 0.8 1.2  Ankle dorsiflexion    Ankle plantarflexion    Ankle inversion    Ankle eversion    (Blank rows = not tested)  BED MOBILITY:  Assess in future session due to limited time  TRANSFERS: Assistive device utilized: Bariatric RW with sit to stands, slide board to table  Sit to stand: unable to reach full stand Stand to sit: unable to reach full stand  Chair to chair: slide board with CGA     GAIT: Unable to ambulate at this time.   FUNCTIONAL TESTS:  Sit to stand: tricep press with BUE; unable to bring arm to walker.    Function In Sitting Test (FIST)  (1/2 femur on surface; hips/knees flexed to 90deg)   - indicate bed or mat table / step stool if used  SCORING KEY: 4 = Independent (completes task independently & successfully) 3 = Verbal Cues/Increased Time (completes task independently & successfully and only needs more time/cues) 2 = Upper Extremity Support (must use UE for support or assistance to complete successfully) 1 = Needs Assistance (unable to complete w/o physical assist; DOCUMENT LEVEL: min, mod, max) 0 = Dependent (requires complete physical assist; unable to complete successfully even w/ physical assist)  Randomly Administer Once Throughout Exam  4 - Anterior Nudge (superior sternum)  4 - Posterior Nudge (between scapular spines)  4 - Lateral Nudge (to dominant side at acromion)     4 - Static sitting (30 seconds)  4 - Sitting,  shake 'no' (left and right)  4 - Sitting, eyes closed (30 seconds)   0 - Sitting, lift foot (dominant side, lift foot 1 inch twice)    2 - Pick up object from behind (object at midline, hands breadth posterior)  3 - Forward reach (use dominant arm, must complete full motion) 2 - Lateral reach (use dominant arm, clear opposite ischial tuberosity) 2 - Pick up object from floor (from between feet)   2 - Posterior scooting (move backwards 2 inches)  2 - Anterior scooting (move forward 2 inches)  2 - Lateral scooting (move to dominant side 2 inches)    TOTAL = 39/56  Notes/comments: slide board tranfer to/from table   MCD > 5 points MCID for IP REHAB > 6 points  Function In Sitting Test (FIST) 08/14/23 (1/2 femur on surface; hips/knees flexed to 90deg)   - indicate bed or mat table / step stool if used  SCORING KEY: 4 = Independent (completes task independently & successfully) 3 = Verbal Cues/Increased Time (completes task independently & successfully and only needs more time/cues) 2 = Upper Extremity Support (must use UE for support or assistance to complete successfully) 1 = Needs Assistance (unable to complete w/o physical assist; DOCUMENT LEVEL: min, mod, max) 0 = Dependent (requires complete physical assist; unable to complete successfully even w/ physical assist)  Randomly Administer Once Throughout Exam  4 - Anterior Nudge (superior sternum)  4 - Posterior Nudge (between scapular spines)  4 - Lateral Nudge (to dominant side at acromion)     4 - Static sitting (30 seconds)  4 - Sitting, shake 'no' (left and right)  4 - Sitting, eyes closed (30  seconds)   0 - Sitting, lift foot (dominant side, lift foot 1 inch twice)    4 - Pick up object from behind (object at midline, hands breadth posterior)  4 - Forward reach (use dominant arm, must complete full motion) 4 - Lateral reach (use dominant arm, clear opposite ischial tuberosity) 2 - Pick up object from floor (from between  feet)   3 - Posterior scooting (move backwards 2 inches)  3 - Anterior scooting (move forward 2 inches)  3 - Lateral scooting (move to dominant side 2 inches)    TOTAL = 47/56    MCD > 5 points MCID for IP REHAB > 6 points                                                                                                                              TREATMENT DATE: 11/14/23  Ther.Act: In // bars with gait belt and CGA: - sit to stand attempts x4- 2 successful attempt with bilateral UE support on // bars- pt able to take one step with R LE - Standing UE taps on SPT shoulder x10 each UE; 2 rounds - Standing weight shifting laterally x multiple reps  Seated: - Weighted orange ball overhead press 2x10 - Weighted orange ball chest press 2x10 - Weighted orange ball Russian twist 2x10  Thorough rest breaks provided during session to prevent fatigue   NMR: Blaze pod: Activity Description: 6 pods positioned in semi-circle on table. If pod lights up red pt taps with R hand, if pod lights up green pt taps with L hand Activity Setting:  The Blaze Pod Random setting was chosen to enhance cognitive processing and agility, providing an unpredictable environment to simulate real-world scenarios, and fostering quick reactions and adaptability.  Number of Pods:  6 Cycles/Sets:  3 Duration (Time or Hit Count):  1:00  Patient Stats  Hits:   Round 1- Round 2- Round 3-    Seated on dynadisc: -static sit 60 seconds x 2 trials -medial/lateral weight shift 15x -anterior/posterior weight shift 15x    PATIENT EDUCATION: Education details: Pt educated throughout session about proper posture and technique with exercises. Improved exercise technique, movement at target joints, use of target muscles after min to mod verbal, visual, tactile cues.  Person educated: Patient Education method: Explanation, Demonstration, Tactile cues, and Verbal cues Education comprehension: verbalized understanding,  returned demonstration, verbal cues required, tactile cues required, and needs further education  HOME EXERCISE PROGRAM: Stand 3x/day in stander   GOALS: Goals reviewed with patient? Yes  SHORT TERM GOALS: Target date: 08/08/2023    Patient will be independent in home exercise program to improve strength/mobility for better functional independence with ADLs.  Baseline: 4/8: compliance Goal status: MET    LONG TERM GOALS: Target date: 12/27/2023   Patient will tolerate five consecutive stands with UE support from wheelchair to standing to improve functional mobility.  Baseline: unable to perform 4/8:unable to perform full stand  5/20: perform next session 5/29: two full stands with CGA and cushion on her seat Goal status: Ongoing   2.  Patient will ambulate 10 ft with bRW with wheelchair follow.  Baseline:  unable to ambulate 4/8: unable to ambulate 5/20: able to ambulate length of // bars in previous session 5/27: ambulate 2.5 lengths of // bars with two seated rest breaks with min A for weight shift and wheelchair follow  Goal status: partially met  3.  Patient will improve FIST score >6 points to demonstrate improved stability and ability to perform ADLs.  Baseline:  3/6: 39/56 4/8: 47/ 56 5/20: 48/56 Goal status: MET  4.  Patient will perform toileting with mod I at home for improved independence.  Baseline: 3/6: requires assistance 4/8: requires dependence on machine 5/20: requires assistance with use of wipe buddy.  Goal status: Partially Met    ASSESSMENT:  CLINICAL IMPRESSION: *** Patient will benefit from skilled physical therapy to improve mobility, stability, and independence.   OBJECTIVE IMPAIRMENTS: Abnormal gait, cardiopulmonary status limiting activity, decreased activity tolerance, decreased balance, decreased coordination, decreased endurance, decreased mobility, difficulty walking, decreased ROM, decreased strength, hypomobility, increased fascial restrictions,  impaired perceived functional ability, impaired flexibility, impaired sensation, improper body mechanics, and postural dysfunction.   ACTIVITY LIMITATIONS: carrying, lifting, bending, sitting, standing, squatting, sleeping, stairs, transfers, bed mobility, continence, bathing, toileting, dressing, self feeding, reach over head, hygiene/grooming, locomotion level, and caring for others  PARTICIPATION LIMITATIONS: meal prep, cleaning, laundry, interpersonal relationship, driving, shopping, community activity, and church  PERSONAL FACTORS: Age, Past/current experiences, Time since onset of injury/illness/exacerbation, Transportation, and 3+ comorbidities: ack pain, CBP, chronic L shoulder pain, galactorrhea, neuropathy, HPV, hypercholestermeia, HTN, MS, osteopenia, PONV, wrist fracture are also affecting patient's functional outcome.   REHAB POTENTIAL: Good  CLINICAL DECISION MAKING: Evolving/moderate complexity  EVALUATION COMPLEXITY: Moderate  PLAN:  PT FREQUENCY: 2x/week  PT DURATION: 12 weeks  PLANNED INTERVENTIONS: 97164- PT Re-evaluation, 97110-Therapeutic exercises, 97530- Therapeutic activity, 97112- Neuromuscular re-education, 97535- Self Care, [id number]- Manual therapy, [phone]- Gait training, [phone]- Orthotic Fit/training, [phone]- Canalith repositioning, [id number]- Aquatic Therapy, [phone]- Electrical stimulation (unattended), [phone]- Electrical stimulation (manual), [id number]- Ultrasound, [id number]- Traction (mechanical), Patient/Family education, Balance training, Stair training, Taping, Dry Needling, Joint mobilization, Joint manipulation, Spinal manipulation, Spinal mobilization, Scar mobilization, Compression bandaging, Vestibular training, Visual/preceptual remediation/compensation, Cognitive remediation, DME instructions, Cryotherapy, Moist heat, and Biofeedback  PLAN FOR NEXT SESSION: sit to stands; try supported walking, continue dynamic core stabilization training and seated reaching tasks to  improve weight shifting.   Robinette Esters, SPT  This entire session was performed under direct supervision and direction of a licensed therapist/therapist assistant . I have personally read, edited and approve of the note as written.  Marina  Leopoldo, PT, DPT Physical Therapist - [facility]  Outpatient Physical Therapy- Main Campus [phone]     11/14/2023, 4:47 PM

## 2023-11-15 ENCOUNTER — Ambulatory Visit: Payer: PPO

## 2023-11-15 ENCOUNTER — Ambulatory Visit

## 2023-11-15 DIAGNOSIS — M6281 Muscle weakness (generalized): Secondary | ICD-10-CM

## 2023-11-15 DIAGNOSIS — R2681 Unsteadiness on feet: Secondary | ICD-10-CM

## 2023-11-15 DIAGNOSIS — R278 Other lack of coordination: Secondary | ICD-10-CM

## 2023-11-15 DIAGNOSIS — R2689 Other abnormalities of gait and mobility: Secondary | ICD-10-CM

## 2023-11-15 DIAGNOSIS — R262 Difficulty in walking, not elsewhere classified: Secondary | ICD-10-CM

## 2023-11-15 DIAGNOSIS — R269 Unspecified abnormalities of gait and mobility: Secondary | ICD-10-CM

## 2023-11-15 DIAGNOSIS — G35 Multiple sclerosis: Secondary | ICD-10-CM

## 2023-11-18 NOTE — Therapy (Signed)
 OUTPATIENT OCCUPATIONAL THERAPY NEURO TREATMENT NOTE  Patient Name: Lisa Williams MRN: [id number] DOB:May 09, 1960, 63 y.o., female Today's Date: 11/18/2023  PCP: Dr. Layman Piety   Neurologist Dr. Suanne at [facility] REFERRING PROVIDER: Dr. Layman Piety    END OF SESSION:  OT End of Session - 11/18/23 1305     Visit Number 21    Number of Visits 44    Date for OT Re-Evaluation 02/05/24    Authorization Time Period Reporting period beginning 09/28/23-01/14/24    Progress Note Due on Visit 30    OT Start Time 1150    OT Stop Time 1230    OT Time Calculation (min) 40 min    Equipment Utilized During Treatment manual wc    Activity Tolerance Patient tolerated treatment well    Behavior During Therapy [facility] for tasks assessed/performed         Past Medical History:  Diagnosis Date   Abdominal pain, right upper quadrant    Back pain    Calculus of kidney 12/09/2013   Chronic back pain    unspecified   Chronic left shoulder pain 07/19/2015   Complication of anesthesia    Functional disorder of bladder    other   Galactorrhea 11/26/2014   Chronic    Hereditary and idiopathic neuropathy 08/19/2013   History of kidney stones    HPV test positive    Hypercholesteremia 08/19/2013   Hypertension    Incomplete bladder emptying    Microscopic hematuria    MS (multiple sclerosis) (HCC)    Muscle spasticity 05/21/2014   Nonspecific findings on examination of urine    other   Osteopenia    PONV (postoperative nausea and vomiting)    Status post laparoscopic supracervical hysterectomy 11/26/2014   Tobacco user 11/26/2014   Wrist fracture    Past Surgical History:  Procedure Laterality Date   bilateral tubal ligation  1996   BREAST CYST EXCISION Left 2002   CYST EXCISION Left 05/10/2022   Procedure: CYST REMOVAL;  Surgeon: Rodolph Romano, MD;  Location: [facility] ORS;  Service: General;  Laterality: Left;   FRACTURE SURGERY     KNEE SURGERY     right    LAPAROSCOPIC SUPRACERVICAL HYSTERECTOMY  08/05/2013   ORIF WRIST FRACTURE Left 01/17/2017   Procedure: OPEN REDUCTION INTERNAL FIXATION (ORIF) WRIST FRACTURE;  Surgeon: Leora Lynwood SAUNDERS, MD;  Location: [facility] ORS;  Service: Orthopedics;  Laterality: Left;   RADIOLOGY WITH ANESTHESIA N/A 03/18/2020   Procedure: MRI WITH ANESTHESIA CERVICAL SPINE AND BRAIN  WITH AND WITHOUT CONTRAST;  Surgeon: Radiologist, Medication, MD;  Location: [facility] OR;  Service: Radiology;  Laterality: N/A;   TUBAL LIGATION Bilateral    VAGINAL HYSTERECTOMY  03/2006   Patient Active Problem List   Diagnosis Date Noted   Sinus tachycardia 07/07/2023   Hypokalemia 07/29/2022   Weakness of both lower extremities 07/29/2022   Abscess of left groin 11/15/2021   Abnormal LFTs 11/15/2021   Acute respiratory disease due to COVID-19 virus 11/21/2020   Weakness    Hypoalbuminemia due to protein-calorie malnutrition [facility])    Neurogenic bowel    Neurogenic bladder    Labile blood pressure    Neuropathic pain    Abscess of female pelvis    SVT (supraventricular tachycardia) (HCC)    Radial styloid tenosynovitis 03/12/2018   Wheelchair dependence 02/27/2018   Localized osteoporosis with current pathological fracture with routine healing 01/19/2017   Wrist fracture 01/16/2017   Sprain of ankle 03/23/2016   Closed fracture  of lateral malleolus 03/16/2016   Health care maintenance 01/24/2016   Blood pressure elevated without history of HTN 10/25/2015   Essential hypertension 10/25/2015   Multiple sclerosis (HCC) 10/02/2015   Chronic left shoulder pain 07/19/2015   Multiple sclerosis exacerbation (HCC) 07/14/2015   MS (multiple sclerosis) (HCC) 11/26/2014   Increased body mass index 11/26/2014   HPV test positive 11/26/2014   Status post laparoscopic supracervical hysterectomy 11/26/2014   Galactorrhea 11/26/2014   Back ache 05/21/2014   Adiposity 05/21/2014   Disordered sleep 05/21/2014   Muscle spasticity 05/21/2014    Spasticity 05/21/2014   Calculus of kidney 12/09/2013   Renal colic 12/09/2013   Hypercholesteremia 08/19/2013   Hereditary and idiopathic neuropathy 08/19/2013   Hypercholesterolemia without hypertriglyceridemia 08/19/2013   Bladder infection, chronic 07/25/2012   Disorder of bladder function 07/25/2012   Incomplete bladder emptying 07/25/2012   Microscopic hematuria 07/25/2012   Right upper quadrant pain 07/25/2012   ONSET DATE: 5/24 Amon and broke R leg which caused beginning of ADL decline; diagnosed with MS in 1995)  REFERRING DIAG: MS  THERAPY DIAG:  Muscle weakness (generalized)  Other lack of coordination  Multiple sclerosis exacerbation (HCC)  Rationale for Evaluation and Treatment: Rehabilitation  SUBJECTIVE:  SUBJECTIVE STATEMENT: Pt reports having a good PT session today. Pt accompanied by: self  PERTINENT HISTORY: Pt reports increased difficulty with basic self care tasks since breaking her R leg last May.  Since that time, mobility and ADLs have declined, with pt requiring assist from private caregivers and family to manage bathing, toileting, dressing, and functional transfers.  PRECAUTIONS: Fall  WEIGHT BEARING RESTRICTIONS: No  PAIN: 11/15/23: No pain Are you having pain? No; occasional pain in R lower back, but not today  FALLS: Has patient fallen in last 6 months? No Last fall was May 17 of 2024  LIVING ENVIRONMENT: Lives with: lives with their family (including mother who has dementia and sister [name])  Lives in: 2 level home but pt resides on main level Stairs: ramp  Has following equipment at home: Wheelchair (manual), Shower bench, and hand held shower shower, 1 grab bar in the shower, 3in1 commode, sit to stand lift (electric), FWW, hospital bed  PLOF: modified indep with ADL/IADLs prior to May of 7975   PATIENT GOALS: Increase independence with basic self care tasks  OBJECTIVE:  Note: Objective measures were completed at Evaluation unless  otherwise noted.  HAND DOMINANCE: Right  ADLs:  Overall ADLs: assist provided from paid caregiver and sister Transfers/ambulation related to ADLs: sit to stand lift for all transfers, set up for sliding board in/out of bed  Eating: indep  Grooming: modified indep (wc level in mom's bathroom)  UB Dressing: set up (can't access her bedroom closet)  LB Dressing: able to sit on side of bed to don all LB clothing; assist to hike pants/underwear Toileting: assist with clothing management d/t standing within electric lift Bathing: bed bath with sister helping to wash backside Tub Shower transfers: N/A; pt has walk in shower in mom's bathroom (wc does fit in this bathroom but pt reports inability to transfer from wc and requires arm rests on a bench for a successful transfer attempt from any DME).  Tub bench is in pt's bathroom, but lift does not fit through bathroom doorway and pt reports inability to transfer up from tub bench (currently unable to manage either transfer)   Equipment: see above  IADLs: Shopping: [employer] for shopping Light housekeeping: modified indep from wc level Meal  Prep: modified indep from wc level Community mobility: relies on community transportation or family members Medication management: indep Landscape architect: indep Handwriting: NT; pt denies any [facility] challenges  MOBILITY STATUS: Hx of falls  POSTURE COMMENTS:  Rounded shoulders, forward head, anterior pelvic tilt, and weight shift left   ACTIVITY TOLERANCE: Activity tolerance: Per PT; currently standing 30-60 sec within Light Gait harness/lift; currently non-ambulatory  UPPER EXTREMITY ROM:  BUEs WFL  UPPER EXTREMITY MMT:     MMT Right eval Left eval  Shoulder flexion 4+ 4  Shoulder abduction 4+ 4  Shoulder adduction    Shoulder extension    Shoulder internal rotation 4+ 4+  Shoulder external rotation 4 4  Middle trapezius    Lower trapezius    Elbow flexion 4+ 4+  Elbow extension 4+ 4+   Wrist flexion 4+ 4+  Wrist extension 4+ 4+  Wrist ulnar deviation    Wrist radial deviation    Wrist pronation    Wrist supination    (Blank rows = not tested)  HAND FUNCTION/COORDINATION:  R/L WNL; pt denies any coordination deficits/ able to manipulate pills/clothing fasteners/etc without difficulty  11/15/23: *Measures taken d/t PT note revealing pt with increased difficulty manipulating smaller ADL supplies during reaching activities in PT session   11/15/23: Grip strength: R: 70 lbs, L: 63 lbs Pinch strength: Lateral: R: 16 lbs, L: 15 lbs; 3 point pinch: R: 12 lbs, L: 13 lbs 9 hole peg test: Right: 25 sec, L 49 sec   SENSATION: WFL  EDEMA: No visible edema in BUEs  MUSCLE TONE: BUEs WNL  COGNITION: Overall cognitive status: Within functional limits for tasks assessed  VISION: wears glasses all the time, no reports of diplopia  PERCEPTION: Not tested  PRAXIS: WFL  OBSERVATIONS:  Pt pleasant, cooperative, and motivated to work towards improving indep with ADLs.                                                                                                     TREATMENT DATE: 11/15/23 Therapeutic Activity: -Grip/pinch strength measures taken and 9 hole peg test completed.  See above.  [facility] HEP reviewed with trial of various activities, including flipping cards, coin manipulation activities, screwing unscrewing nuts/bolts.  Min vc for using the R less unaffected side as a stabilizer to maximize FM challenge with the L hand.  -OT highlighted recommended activities to complete at home with min vc for grading activities up/down and issued written handout for activities  Therapeutic Exercise: -Instructed pt in self passive stretching to target PIP ext and DIP flexion to maintain flexibility of digits in R/L hands that appear to present with beginnings of boutonnieres deformity. -Issued green theraputty and instructed pt in gross gripping and pinching exercises for maintaining  grip/pinch strength in bilat hands.  PATIENT EDUCATION: Education details: HEP progression Person educated: Patient Education method: Explanation Education comprehension: verbalized understanding  HOME EXERCISE PROGRAM: IR A/AAROM to promote shoulder flexibility for peri care/bathing  GOALS: Goals reviewed with patient? Yes  SHORT TERM GOALS: Target date: 12/25/23  Pt will perform bed bath with  set up only. Baseline: Eval: Min-mod A for posterior washing; 09/28/23: Min A for posterior washing; 11/13/23: Able to manage bed bath but sister helps with thoroughness, per pt's preference; pt in agreement to trial bath sitting EOB or in wc before next OT session. Goal status: achieved/d/c  2.  Pt will utilize sliding board for wc<>drop arm commode transfer with SBA. Baseline: Eval: Currently using electric lift for transfer to [facility]; 09/28/23: Not yet completed at home but transfers to commode have been easier without sliding board d/t pt implementing a scoot pivot. Goal status: d/c (pt does not prefer sliding board for commode transfer)  3.  Pt will be indep to perform HEP for maintaining BUE strength for ADLs and functional transfers. Baseline: Eval: HEP not yet initiated; 09/28/23: Pt is working on core stability exercises from wc, cane stretches for bilat shoulder IR, and tricep extension via wc pushups; 11/13/23: indep Goal status: achieved  LONG TERM GOALS: Target date: 02/05/24  Pt will perform sink bath with set up A. (Revised on 11/13/23 from min A to set up) Baseline: Eval: Currently bed bathing d/t inability to stand at sink without electric lift (lift does not fit into pt's bathroom); 09/28/23: still completing bed bath as pt is not yet able to stand; 11/13/23: Not yet attempted; pt encouraged to attempt before next OT session now that bed bath goal has been met Goal status: ongoing   2.  Pt will perform wc<>tub bench transfer with min A. Baseline: Eval: Unable; 09/28/23: Performed in OT  clinic with min A, but not yet in the home Goal status: d/c (pt prefers sink bath)  3.  Pt will perform squat pivot transfer wc<>BSC with SBA.  Baseline: Eval: Eval: Currently using electric lift for transfer to [facility]; 09/28/23: Not yet completed in clinic with [facility], and using lift for [facility] in the home still; 11/13/23: Still using Camie lift at home and in clinic transfers are all scoot rather than squat pivots d/t LE weakness. Goal status: ongoing  4. Pt will perform scoot transfer wc<>toilet using grab bars with supv to allow toileting in community setting.  Baseline: Eval: Pt limits community outings d/t requiring use of lift for [facility] transfers; 09/28/23: Pt has performed 2 successful scoot pivot  Transfers to toilet in OT clinic with min A, extra time.  Further trials with clothing management on toilet needed, but pt can lower and hike  pants while sititng edge of mat via lateral leaning and min A to maintain core stability; 11/13/23: Pt performs with CGA and extra time.    Goal status: ongoing  5.  Pt will complete seated clothing management with min A to enable voiding in handicapped stall within community. Baseline: Recert 11/13/23: Pt can transfer to toilet with increased time and effort, but has not yet attempted clothing management or voiding while seated on commode in community setting.  Pt can lower pants to knees while seated in wc, but requires mod A to hike over hips from seated position.   6.  Pt will tolerate standing x1 min with close supv and BUE support to manage clothing in prep for toileting. Baseline: Eval: Currently standing in electric lift only, or within parallel bars without lift; 09/28/23: Not yet attempted; 11/13/23: Pt is able to perform static standing with heavy BUE support on parallel bars with PT Goal status: in progress  7.  Pt will increase bilat grip strength by (TBD) in order to securely grasp walker sufficiently for functional transfers and  ambulation.  Baseline:  Recert 11/13/23: TBD (PT note read following OT session, indicating pt with difficulty securing items in hand)  Goal status: New  8.  Pt will increase bilat [facility] skills in order to manipulate small ADL supplies with reduced dropping as indicated by (TBD) improvement in 9 hole peg test. Baseline: Recert 11/13/23: 9 hole peg test TBD (PT note read following OT session, indicating pt with difficulty manipulating and securing items in hands)  Goal status: New  ASSESSMENT: CLINICAL IMPRESSION: Pt with good tolerance to exercises and activities noted above this date.  Objective measures taken for bilat hand strength and coordination.  Bilat grip and pinch measures are noted to be within age range norms, but pt does present with increased difficulty with [facility] skills, specifically in the L hand when attempting translatory movements.  Pt was receptive to all education on HEP progression and activities identified today to add to her HEP.  Pt will continue to benefit from skilled OT to work towards above noted goals in OT poc, working to maximize indep with daily tasks while reducing burden of care on caregivers.   PERFORMANCE DEFICITS: in functional skills including ADLs, IADLs, strength, pain, flexibility, Gross motor control, mobility, balance, body mechanics, endurance, and decreased knowledge of use of DME, and psychosocial skills including coping strategies, environmental adaptation, habits, and routines and behaviors.   IMPAIRMENTS: are limiting patient from ADLs, IADLs, and social participation.   CO-MORBIDITIES: has co-morbidities such as neuropathy, back pain, obesity, MS, HTN that affects occupational performance. Patient will benefit from skilled OT to address above impairments and improve overall function.  MODIFICATION OR ASSISTANCE TO COMPLETE EVALUATION: No modification of tasks or assist necessary to complete an evaluation.  OT OCCUPATIONAL PROFILE AND HISTORY: Detailed assessment: Review of  records and additional review of physical, cognitive, psychosocial history related to current functional performance.  CLINICAL DECISION MAKING: Moderate - several treatment options, min-mod task modification necessary  REHAB POTENTIAL: Good  EVALUATION COMPLEXITY: Moderate    PLAN:  OT FREQUENCY: 2x/week  OT DURATION: 12 weeks  PLANNED INTERVENTIONS: 97168 OT Re-evaluation, 97535 self care/ADL training, 02889 therapeutic exercise, 97530 therapeutic activity, 97112 neuromuscular re-education, 97140 manual therapy, 97116 gait training, 02989 moist heat, 97010 cryotherapy, balance training, functional mobility training, psychosocial skills training, energy conservation, coping strategies training, patient/family education, and DME and/or AE instructions  RECOMMENDED OTHER SERVICES: None at this time (Pt currently receiving PT services in this clinic)  CONSULTED AND AGREED WITH PLAN OF CARE: Patient  PLAN FOR NEXT SESSION: see above  Inocente Blazing, MS, OTR/L  Inocente MARLA Blazing, OT 11/18/2023, 1:06 PM

## 2023-11-20 ENCOUNTER — Ambulatory Visit: Payer: PPO

## 2023-11-20 ENCOUNTER — Ambulatory Visit

## 2023-11-20 DIAGNOSIS — M6281 Muscle weakness (generalized): Secondary | ICD-10-CM | POA: Diagnosis not present

## 2023-11-20 DIAGNOSIS — R278 Other lack of coordination: Secondary | ICD-10-CM

## 2023-11-20 DIAGNOSIS — R2681 Unsteadiness on feet: Secondary | ICD-10-CM

## 2023-11-20 DIAGNOSIS — R262 Difficulty in walking, not elsewhere classified: Secondary | ICD-10-CM

## 2023-11-20 DIAGNOSIS — R2689 Other abnormalities of gait and mobility: Secondary | ICD-10-CM

## 2023-11-20 DIAGNOSIS — G35 Multiple sclerosis: Secondary | ICD-10-CM

## 2023-11-20 NOTE — Therapy (Signed)
 OUTPATIENT PHYSICAL THERAPY NEURO TREATMENT       Patient Name: Lisa Williams MRN: [id number] DOB:March 11, 1961, 63 y.o., female Today's Date: 11/20/2023   PCP: Lenon Na  REFERRING PROVIDER: Lenon Na  END OF SESSION:  PT End of Session - 11/20/23 1012     Visit Number 35    Number of Visits 47    Date for PT Re-Evaluation 12/27/23   corrected   PT Start Time 1015    PT Stop Time 1056    PT Time Calculation (min) 41 min    Equipment Utilized During Treatment Gait belt    Activity Tolerance Patient tolerated treatment well;No increased pain    Behavior During Therapy [facility] for tasks assessed/performed                                        Past Medical History:  Diagnosis Date   Abdominal pain, right upper quadrant    Back pain    Calculus of kidney 12/09/2013   Chronic back pain    unspecified   Chronic left shoulder pain 07/19/2015   Complication of anesthesia    Functional disorder of bladder    other   Galactorrhea 11/26/2014   Chronic    Hereditary and idiopathic neuropathy 08/19/2013   History of kidney stones    HPV test positive    Hypercholesteremia 08/19/2013   Hypertension    Incomplete bladder emptying    Microscopic hematuria    MS (multiple sclerosis) (HCC)    Muscle spasticity 05/21/2014   Nonspecific findings on examination of urine    other   Osteopenia    PONV (postoperative nausea and vomiting)    Status post laparoscopic supracervical hysterectomy 11/26/2014   Tobacco user 11/26/2014   Wrist fracture    Past Surgical History:  Procedure Laterality Date   bilateral tubal ligation  1996   BREAST CYST EXCISION Left 2002   CYST EXCISION Left 05/10/2022   Procedure: CYST REMOVAL;  Surgeon: Rodolph Romano, MD;  Location: [facility] ORS;  Service: General;  Laterality: Left;   FRACTURE SURGERY     KNEE SURGERY     right   LAPAROSCOPIC SUPRACERVICAL HYSTERECTOMY  08/05/2013   ORIF  WRIST FRACTURE Left 01/17/2017   Procedure: OPEN REDUCTION INTERNAL FIXATION (ORIF) WRIST FRACTURE;  Surgeon: Leora Lynwood SAUNDERS, MD;  Location: [facility] ORS;  Service: Orthopedics;  Laterality: Left;   RADIOLOGY WITH ANESTHESIA N/A 03/18/2020   Procedure: MRI WITH ANESTHESIA CERVICAL SPINE AND BRAIN  WITH AND WITHOUT CONTRAST;  Surgeon: Radiologist, Medication, MD;  Location: [facility] OR;  Service: Radiology;  Laterality: N/A;   TUBAL LIGATION Bilateral    VAGINAL HYSTERECTOMY  03/2006   Patient Active Problem List   Diagnosis Date Noted   Sinus tachycardia 07/07/2023   Hypokalemia 07/29/2022   Weakness of both lower extremities 07/29/2022   Abscess of left groin 11/15/2021   Abnormal LFTs 11/15/2021   Acute respiratory disease due to COVID-19 virus 11/21/2020   Weakness    Hypoalbuminemia due to protein-calorie malnutrition [facility])    Neurogenic bowel    Neurogenic bladder    Labile blood pressure    Neuropathic pain    Abscess of female pelvis    SVT (supraventricular tachycardia) (HCC)    Radial styloid tenosynovitis 03/12/2018   Wheelchair dependence 02/27/2018   Localized osteoporosis with current pathological fracture with routine healing 01/19/2017   Wrist fracture  01/16/2017   Sprain of ankle 03/23/2016   Closed fracture of lateral malleolus 03/16/2016   Health care maintenance 01/24/2016   Blood pressure elevated without history of HTN 10/25/2015   Essential hypertension 10/25/2015   Multiple sclerosis (HCC) 10/02/2015   Chronic left shoulder pain 07/19/2015   Multiple sclerosis exacerbation (HCC) 07/14/2015   MS (multiple sclerosis) (HCC) 11/26/2014   Increased body mass index 11/26/2014   HPV test positive 11/26/2014   Status post laparoscopic supracervical hysterectomy 11/26/2014   Galactorrhea 11/26/2014   Back ache 05/21/2014   Adiposity 05/21/2014   Disordered sleep 05/21/2014   Muscle spasticity 05/21/2014   Spasticity 05/21/2014   Calculus of kidney 12/09/2013   Renal  colic 12/09/2013   Hypercholesteremia 08/19/2013   Hereditary and idiopathic neuropathy 08/19/2013   Hypercholesterolemia without hypertriglyceridemia 08/19/2013   Bladder infection, chronic 07/25/2012   Disorder of bladder function 07/25/2012   Incomplete bladder emptying 07/25/2012   Microscopic hematuria 07/25/2012   Right upper quadrant pain 07/25/2012    ONSET DATE: 1995  REFERRING DIAG: MS  THERAPY DIAG:  Muscle weakness (generalized)  Other lack of coordination  Difficulty in walking, not elsewhere classified  Unsteadiness on feet  Other abnormalities of gait and mobility  Rationale for Evaluation and Treatment: Rehabilitation  SUBJECTIVE:                                                                                                                                                                                             SUBJECTIVE STATEMENT: Pt reports that she has not been able to sleep very well since taking her medication on Saturday.    Pt accompanied by: self  PERTINENT HISTORY:  Patient is returning to PT s/p hospitalization.  s/p ORIF of R tibia shaft fracture 09/23/2022. Patient has weakness in BLE with RLE>LLE. She drives with hand controls. Patient has been diagnosed with MS in 1995. PMH includes: back pain, CBP, chronic L shoulder pain, galactorrhea, neuropathy, HPV, hypercholestermeia, HTN, MS, osteopenia, PONV, wrist fracture. Additional order for other closed fracture of proximal end of R tibia with routine healing. Still has to use [employer].   PAIN:  Are you having pain? Occasional pain in RLE; primarily in knee  PRECAUTIONS: Fall  RED FLAGS: None   WEIGHT BEARING RESTRICTIONS: No  FALLS: Has patient fallen in last 6 months? No  LIVING ENVIRONMENT: Lives with: lives with their family Lives in: House/apartment Stairs: ramp Has following equipment at home: Vannie - 2 wheeled, Wheelchair (manual), shower chair, and Grab bars  PLOF:  Independent with household mobility with device  PATIENT GOALS: to get her independence back.  To be able to get into car, toilet, and get dressed independently  OBJECTIVE:  Note: Objective measures were completed at Evaluation unless otherwise noted.  DIAGNOSTIC FINDINGS: MRI of the brain 03/18/2020 showed multiple T2/FLAIR hyperintense foci in the periventricular, juxtacortical and deep white matter.  There were no infratentorial lesions noted.  None of the foci enhanced.   Due to severe claustrophobia, the study was done with conscious sedation in the hospital   COGNITION: Overall cognitive status: Within functional limits for tasks assessed   SENSATION: Lack of sensation in feet, loss in bilateral lateral aspect of knee  COORDINATION: Does not have the strength for functional LE heel slide test     MUSCLE TONE: BLE mild tone    POSTURE: rounded shoulders, forward head, anterior pelvic tilt, and weight shift left    LOWER EXTREMITY MMT:    MMT Right Eval Left Eval  Hip flexion 0.9 1.5  Hip extension    Hip abduction 1.8 2.6  Hip adduction 3.1 1.9  Hip internal rotation    Hip external rotation    Knee flexion 0.9 1.5  Knee extension 0.8 1.2  Ankle dorsiflexion    Ankle plantarflexion    Ankle inversion    Ankle eversion    (Blank rows = not tested)  BED MOBILITY:  Assess in future session due to limited time  TRANSFERS: Assistive device utilized: Bariatric RW with sit to stands, slide board to table  Sit to stand: unable to reach full stand Stand to sit: unable to reach full stand  Chair to chair: slide board with CGA     GAIT: Unable to ambulate at this time.   FUNCTIONAL TESTS:  Sit to stand: tricep press with BUE; unable to bring arm to walker.    Function In Sitting Test (FIST)  (1/2 femur on surface; hips/knees flexed to 90deg)   - indicate bed or mat table / step stool if used  SCORING KEY: 4 = Independent (completes task independently &  successfully) 3 = Verbal Cues/Increased Time (completes task independently & successfully and only needs more time/cues) 2 = Upper Extremity Support (must use UE for support or assistance to complete successfully) 1 = Needs Assistance (unable to complete w/o physical assist; DOCUMENT LEVEL: min, mod, max) 0 = Dependent (requires complete physical assist; unable to complete successfully even w/ physical assist)  Randomly Administer Once Throughout Exam  4 - Anterior Nudge (superior sternum)  4 - Posterior Nudge (between scapular spines)  4 - Lateral Nudge (to dominant side at acromion)     4 - Static sitting (30 seconds)  4 - Sitting, shake 'no' (left and right)  4 - Sitting, eyes closed (30 seconds)   0 - Sitting, lift foot (dominant side, lift foot 1 inch twice)    2 - Pick up object from behind (object at midline, hands breadth posterior)  3 - Forward reach (use dominant arm, must complete full motion) 2 - Lateral reach (use dominant arm, clear opposite ischial tuberosity) 2 - Pick up object from floor (from between feet)   2 - Posterior scooting (move backwards 2 inches)  2 - Anterior scooting (move forward 2 inches)  2 - Lateral scooting (move to dominant side 2 inches)    TOTAL = 39/56  Notes/comments: slide board tranfer to/from table   MCD > 5 points MCID for IP REHAB > 6 points  Function In Sitting Test (FIST) 08/14/23 (1/2 femur on surface; hips/knees flexed to 90deg)   - indicate  bed or mat table / step stool if used  SCORING KEY: 4 = Independent (completes task independently & successfully) 3 = Verbal Cues/Increased Time (completes task independently & successfully and only needs more time/cues) 2 = Upper Extremity Support (must use UE for support or assistance to complete successfully) 1 = Needs Assistance (unable to complete w/o physical assist; DOCUMENT LEVEL: min, mod, max) 0 = Dependent (requires complete physical assist; unable to complete successfully even w/  physical assist)  Randomly Administer Once Throughout Exam  4 - Anterior Nudge (superior sternum)  4 - Posterior Nudge (between scapular spines)  4 - Lateral Nudge (to dominant side at acromion)     4 - Static sitting (30 seconds)  4 - Sitting, shake 'no' (left and right)  4 - Sitting, eyes closed (30 seconds)   0 - Sitting, lift foot (dominant side, lift foot 1 inch twice)    4 - Pick up object from behind (object at midline, hands breadth posterior)  4 - Forward reach (use dominant arm, must complete full motion) 4 - Lateral reach (use dominant arm, clear opposite ischial tuberosity) 2 - Pick up object from floor (from between feet)   3 - Posterior scooting (move backwards 2 inches)  3 - Anterior scooting (move forward 2 inches)  3 - Lateral scooting (move to dominant side 2 inches)    TOTAL = 47/56    MCD > 5 points MCID for IP REHAB > 6 points                                                                                                                              TREATMENT DATE: 11/20/23  Ther.Act: In // bars with gait belt and CGA: - sit to stand attempts x3- 3 successful attempt with bilateral UE support on // bars- blocking of R anterior knee to prevent buckling and assistance at L posterior knee to limit hyperextension - Standing weight shifting laterally x multiple reps - Standing shoulder taps on SPT x multiple reps  Seated: - med ball overhead press x10 - med ball chest press x10 - med ball russian twists x10  Thorough rest breaks provided during session to prevent fatigue   NMR:  Blaze Pods: Activity Description: 6 pods aligned in semi-circle on table in front of pt. Pod will light up one of 2 colors- if it lights up red pt taps with R hand, if it lights up green pt taps with L hand Activity Setting:  The Blaze Pod Random setting was chosen to enhance cognitive processing and agility, providing an unpredictable environment to simulate real-world  scenarios, and fostering quick reactions and adaptability.  Number of Pods:  6 Cycles/Sets:  3 Duration (Time or Hit Count):  1:00  Patient Stats  Hits:   Round 1: 52 (3 incorrect)  Round 2: 48 (0 incorrect) Round 3: 49 (0 incorrect)     PATIENT EDUCATION: Education details: Pt educated throughout session about  proper posture and technique with exercises. Improved exercise technique, movement at target joints, use of target muscles after min to mod verbal, visual, tactile cues.  Person educated: Patient Education method: Explanation, Demonstration, Tactile cues, and Verbal cues Education comprehension: verbalized understanding, returned demonstration, verbal cues required, tactile cues required, and needs further education  HOME EXERCISE PROGRAM: Stand 3x/day in stander   GOALS: Goals reviewed with patient? Yes  SHORT TERM GOALS: Target date: 08/08/2023    Patient will be independent in home exercise program to improve strength/mobility for better functional independence with ADLs.  Baseline: 4/8: compliance Goal status: MET    LONG TERM GOALS: Target date: 12/27/2023   Patient will tolerate five consecutive stands with UE support from wheelchair to standing to improve functional mobility.  Baseline: unable to perform 4/8:unable to perform full stand 5/20: perform next session 5/29: two full stands with CGA and cushion on her seat Goal status: Ongoing   2.  Patient will ambulate 10 ft with bRW with wheelchair follow.  Baseline:  unable to ambulate 4/8: unable to ambulate 5/20: able to ambulate length of // bars in previous session 5/27: ambulate 2.5 lengths of // bars with two seated rest breaks with min A for weight shift and wheelchair follow  Goal status: partially met  3.  Patient will improve FIST score >6 points to demonstrate improved stability and ability to perform ADLs.  Baseline:  3/6: 39/56 4/8: 47/ 56 5/20: 48/56 Goal status: MET  4.  Patient will perform  toileting with mod I at home for improved independence.  Baseline: 3/6: requires assistance 4/8: requires dependence on machine 5/20: requires assistance with use of wipe buddy.  Goal status: Partially Met    ASSESSMENT:  CLINICAL IMPRESSION: Pt arrived with high motivation to participate in PT today despite not feeling the best from medication. Pt able to complete 3 standing attempts with great form, but unable to perform ambulation in / bars today. During standing, pt required blocking of R knee to prevent buckling and assistance from posterior L knee to limit hyperextension. Pt tolerated all activities well and denied excess fatigue at end of session. Patient will benefit from skilled physical therapy to improve mobility, stability, and independence.   OBJECTIVE IMPAIRMENTS: Abnormal gait, cardiopulmonary status limiting activity, decreased activity tolerance, decreased balance, decreased coordination, decreased endurance, decreased mobility, difficulty walking, decreased ROM, decreased strength, hypomobility, increased fascial restrictions, impaired perceived functional ability, impaired flexibility, impaired sensation, improper body mechanics, and postural dysfunction.   ACTIVITY LIMITATIONS: carrying, lifting, bending, sitting, standing, squatting, sleeping, stairs, transfers, bed mobility, continence, bathing, toileting, dressing, self feeding, reach over head, hygiene/grooming, locomotion level, and caring for others  PARTICIPATION LIMITATIONS: meal prep, cleaning, laundry, interpersonal relationship, driving, shopping, community activity, and church  PERSONAL FACTORS: Age, Past/current experiences, Time since onset of injury/illness/exacerbation, Transportation, and 3+ comorbidities: ack pain, CBP, chronic L shoulder pain, galactorrhea, neuropathy, HPV, hypercholestermeia, HTN, MS, osteopenia, PONV, wrist fracture are also affecting patient's functional outcome.   REHAB POTENTIAL:  Good  CLINICAL DECISION MAKING: Evolving/moderate complexity  EVALUATION COMPLEXITY: Moderate  PLAN:  PT FREQUENCY: 2x/week  PT DURATION: 12 weeks  PLANNED INTERVENTIONS: 97164- PT Re-evaluation, 97110-Therapeutic exercises, 97530- Therapeutic activity, 97112- Neuromuscular re-education, 97535- Self Care, [id number]- Manual therapy, [phone]- Gait training, [phone]- Orthotic Fit/training, [phone]- Canalith repositioning, [id number]- Aquatic Therapy, [phone]- Electrical stimulation (unattended), [phone]- Electrical stimulation (manual), [id number]- Ultrasound, [id number]- Traction (mechanical), Patient/Family education, Balance training, Stair training, Taping, Dry Needling, Joint mobilization, Joint manipulation, Spinal manipulation, Spinal mobilization, Scar  mobilization, Compression bandaging, Vestibular training, Visual/preceptual remediation/compensation, Cognitive remediation, DME instructions, Cryotherapy, Moist heat, and Biofeedback  PLAN FOR NEXT SESSION: sit to stands; try supported walking, continue dynamic core stabilization training and seated reaching tasks to improve weight shifting.   Giuseppe Duchemin, SPT  This entire session was performed under direct supervision and direction of a licensed therapist/therapist assistant . I have personally read, edited and approve of the note as written.  Marina  Leopoldo, PT, DPT Physical Therapist - [facility]  Outpatient Physical Therapy- Main Campus [phone]     11/20/2023, 1:20 PM

## 2023-11-22 ENCOUNTER — Ambulatory Visit: Payer: PPO

## 2023-11-22 ENCOUNTER — Ambulatory Visit

## 2023-11-22 DIAGNOSIS — R2689 Other abnormalities of gait and mobility: Secondary | ICD-10-CM

## 2023-11-22 DIAGNOSIS — R2681 Unsteadiness on feet: Secondary | ICD-10-CM

## 2023-11-22 DIAGNOSIS — R278 Other lack of coordination: Secondary | ICD-10-CM

## 2023-11-22 DIAGNOSIS — G35 Multiple sclerosis: Secondary | ICD-10-CM

## 2023-11-22 DIAGNOSIS — M6281 Muscle weakness (generalized): Secondary | ICD-10-CM

## 2023-11-22 DIAGNOSIS — R262 Difficulty in walking, not elsewhere classified: Secondary | ICD-10-CM

## 2023-11-22 NOTE — Therapy (Signed)
 OUTPATIENT PHYSICAL THERAPY NEURO TREATMENT   Patient Name: Lisa Williams MRN: [id number] DOB:27-Feb-1961, 63 y.o., female Today's Date: 11/22/2023   PCP: Lenon Na  REFERRING PROVIDER: Lenon Na  END OF SESSION:  PT End of Session - 11/22/23 1100     Visit Number 36    Number of Visits 47    Date for PT Re-Evaluation 12/27/23   corrected   PT Start Time 1100    PT Stop Time 1142    PT Time Calculation (min) 42 min    Equipment Utilized During Treatment Gait belt    Activity Tolerance Patient tolerated treatment well;No increased pain    Behavior During Therapy [facility] for tasks assessed/performed          Past Medical History:  Diagnosis Date   Abdominal pain, right upper quadrant    Back pain    Calculus of kidney 12/09/2013   Chronic back pain    unspecified   Chronic left shoulder pain 07/19/2015   Complication of anesthesia    Functional disorder of bladder    other   Galactorrhea 11/26/2014   Chronic    Hereditary and idiopathic neuropathy 08/19/2013   History of kidney stones    HPV test positive    Hypercholesteremia 08/19/2013   Hypertension    Incomplete bladder emptying    Microscopic hematuria    MS (multiple sclerosis) (HCC)    Muscle spasticity 05/21/2014   Nonspecific findings on examination of urine    other   Osteopenia    PONV (postoperative nausea and vomiting)    Status post laparoscopic supracervical hysterectomy 11/26/2014   Tobacco user 11/26/2014   Wrist fracture    Past Surgical History:  Procedure Laterality Date   bilateral tubal ligation  1996   BREAST CYST EXCISION Left 2002   CYST EXCISION Left 05/10/2022   Procedure: CYST REMOVAL;  Surgeon: Rodolph Romano, MD;  Location: [facility] OR[facility];  Service: General;  Laterality: Left;   FRACTURE SURGERY     KNEE SURGERY     right   LAPAROSCOPIC SUPRACERVICAL HYSTERECTOMY  08/05/2013   ORIF WRIST FRACTURE Left 01/17/2017   Procedure: OPEN REDUCTION INTERNAL  FIXATION (ORIF) WRIST FRACTURE;  Surgeon: Leora Lynwood SAUNDERS, MD;  Location: [facility] OR[facility];  Service: Orthopedics;  Laterality: Left;   RADIOLOGY WITH ANESTHESIA N/A 03/18/2020   Procedure: MRI WITH ANESTHESIA CERVICAL SPINE AND BRAIN  WITH AND WITHOUT CONTRAST;  Surgeon: Radiologist, Medication, MD;  Location: [facility] OR;  Service: Radiology;  Laterality: N/A;   TUBAL LIGATION Bilateral    VAGINAL HYSTERECTOMY  03/2006   Patient Active Problem List   Diagnosis Date Noted   Sinus tachycardia 07/07/2023   Hypokalemia 07/29/2022   Weakness of both lower extremities 07/29/2022   Abscess of left groin 11/15/2021   Abnormal LFTs 11/15/2021   Acute respiratory disease due to COVID-19 virus 11/21/2020   Weakness    Hypoalbuminemia due to protein-calorie malnutrition [facility]    Neurogenic bowel    Neurogenic bladder    Labile blood pressure    Neuropathic pain    Abscess of female pelvis    SVT (supraventricular tachycardia) (HCC)    Radial styloid tenosynovitis 03/12/2018   Wheelchair dependence 02/27/2018   Localized osteoporosis with current pathological fracture with routine healing 01/19/2017   Wrist fracture 01/16/2017   Sprain of ankle 03/23/2016   Closed fracture of lateral malleolus 03/16/2016   Health care maintenance 01/24/2016   Blood pressure elevated without history of HTN 10/25/2015   Essential  hypertension 10/25/2015   Multiple sclerosis (HCC) 10/02/2015   Chronic left shoulder pain 07/19/2015   Multiple sclerosis exacerbation (HCC) 07/14/2015   MS (multiple sclerosis) (HCC) 11/26/2014   Increased body mass index 11/26/2014   HPV test positive 11/26/2014   Status post laparoscopic supracervical hysterectomy 11/26/2014   Galactorrhea 11/26/2014   Back ache 05/21/2014   Adiposity 05/21/2014   Disordered sleep 05/21/2014   Muscle spasticity 05/21/2014   Spasticity 05/21/2014   Calculus of kidney 12/09/2013   Renal colic 12/09/2013   Hypercholesteremia 08/19/2013   Hereditary and  idiopathic neuropathy 08/19/2013   Hypercholesterolemia without hypertriglyceridemia 08/19/2013   Bladder infection, chronic 07/25/2012   Disorder of bladder function 07/25/2012   Incomplete bladder emptying 07/25/2012   Microscopic hematuria 07/25/2012   Right upper quadrant pain 07/25/2012    ONSET DATE: 1995  REFERRING DIAG: MS  THERAPY DIAG:  Muscle weakness (generalized)  Other lack of coordination  Difficulty in walking, not elsewhere classified  Unsteadiness on feet  Other abnormalities of gait and mobility  Rationale for Evaluation and Treatment: Rehabilitation  SUBJECTIVE:                                                                                                                                                                                             SUBJECTIVE STATEMENT:  Pt reports that she is in a good mood today. Pt took last dose of her medication yesterday and won't have to take it again until next year.   Pt accompanied by: self  PERTINENT HISTORY:  Patient is returning to PT s/p hospitalization.  s/p ORIF of R tibia shaft fracture 09/23/2022. Patient has weakness in BLE with RLE>LLE. She drives with hand controls. Patient has been diagnosed with MS in 1995. PMH includes: back pain, CBP, chronic L shoulder pain, galactorrhea, neuropathy, HPV, hypercholestermeia, HTN, MS, osteopenia, PONV, wrist fracture. Additional order for other closed fracture of proximal end of R tibia with routine healing. Still has to use [employer].   PAIN:  Are you having pain? Occasional pain in RLE; primarily in knee  PRECAUTIONS: Fall  RED FLAGS: None   WEIGHT BEARING RESTRICTIONS: No  FALLS: Has patient fallen in last 6 months? No  LIVING ENVIRONMENT: Lives with: lives with their family Lives in: House/apartment Stairs: ramp Has following equipment at home: Vannie - 2 wheeled, Wheelchair (manual), shower chair, and Grab bars  PLOF: Independent with household  mobility with device  PATIENT GOALS: to get her independence back. To be able to get into car, toilet, and get dressed independently  OBJECTIVE:  Note: Objective measures were completed at Evaluation unless otherwise  noted.  DIAGNOSTIC FINDINGS: MRI of the brain 03/18/2020 showed multiple T2/FLAIR hyperintense foci in the periventricular, juxtacortical and deep white matter.  There were no infratentorial lesions noted.  None of the foci enhanced.   Due to severe claustrophobia, the study was done with conscious sedation in the hospital   COGNITION: Overall cognitive status: Within functional limits for tasks assessed   SENSATION: Lack of sensation in feet, loss in bilateral lateral aspect of knee  COORDINATION: Does not have the strength for functional LE heel slide test   MUSCLE TONE: BLE mild tone    POSTURE: rounded shoulders, forward head, anterior pelvic tilt, and weight shift left   LOWER EXTREMITY MMT:    MMT Right Eval Left Eval  Hip flexion 0.9 1.5  Hip extension    Hip abduction 1.8 2.6  Hip adduction 3.1 1.9  Hip internal rotation    Hip external rotation    Knee flexion 0.9 1.5  Knee extension 0.8 1.2  Ankle dorsiflexion    Ankle plantarflexion    Ankle inversion    Ankle eversion    (Blank rows = not tested)  BED MOBILITY:  Assess in future session due to limited time  TRANSFERS: Assistive device utilized: Bariatric RW with sit to stands, slide board to table  Sit to stand: unable to reach full stand Stand to sit: unable to reach full stand  Chair to chair: slide board with CGA   GAIT: Unable to ambulate at this time.   FUNCTIONAL TESTS:  Sit to stand: tricep press with BUE; unable to bring arm to walker.    Function In Sitting Test (FIST)  (1/2 femur on surface; hips/knees flexed to 90deg)   - indicate bed or mat table / step stool if used  SCORING KEY: 4 = Independent (completes task independently & successfully) 3 = Verbal  Cues/Increased Time (completes task independently & successfully and only needs more time/cues) 2 = Upper Extremity Support (must use UE for support or assistance to complete successfully) 1 = Needs Assistance (unable to complete w/o physical assist; DOCUMENT LEVEL: min, mod, max) 0 = Dependent (requires complete physical assist; unable to complete successfully even w/ physical assist)  Randomly Administer Once Throughout Exam  4 - Anterior Nudge (superior sternum)  4 - Posterior Nudge (between scapular spines)  4 - Lateral Nudge (to dominant side at acromion)     4 - Static sitting (30 seconds)  4 - Sitting, shake 'no' (left and right)  4 - Sitting, eyes closed (30 seconds)   0 - Sitting, lift foot (dominant side, lift foot 1 inch twice)    2 - Pick up object from behind (object at midline, hands breadth posterior)  3 - Forward reach (use dominant arm, must complete full motion) 2 - Lateral reach (use dominant arm, clear opposite ischial tuberosity) 2 - Pick up object from floor (from between feet)   2 - Posterior scooting (move backwards 2 inches)  2 - Anterior scooting (move forward 2 inches)  2 - Lateral scooting (move to dominant side 2 inches)    TOTAL = 39/56  Notes/comments: slide board tranfer to/from table   MCD > 5 points MCID for IP REHAB > 6 points  Function In Sitting Test (FIST) 08/14/23 (1/2 femur on surface; hips/knees flexed to 90deg)   - indicate bed or mat table / step stool if used  SCORING KEY: 4 = Independent (completes task independently & successfully) 3 = Verbal Cues/Increased Time (completes task independently &  successfully and only needs more time/cues) 2 = Upper Extremity Support (must use UE for support or assistance to complete successfully) 1 = Needs Assistance (unable to complete w/o physical assist; DOCUMENT LEVEL: min, mod, max) 0 = Dependent (requires complete physical assist; unable to complete successfully even w/ physical  assist)  Randomly Administer Once Throughout Exam  4 - Anterior Nudge (superior sternum)  4 - Posterior Nudge (between scapular spines)  4 - Lateral Nudge (to dominant side at acromion)     4 - Static sitting (30 seconds)  4 - Sitting, shake 'no' (left and right)  4 - Sitting, eyes closed (30 seconds)   0 - Sitting, lift foot (dominant side, lift foot 1 inch twice)    4 - Pick up object from behind (object at midline, hands breadth posterior)  4 - Forward reach (use dominant arm, must complete full motion) 4 - Lateral reach (use dominant arm, clear opposite ischial tuberosity) 2 - Pick up object from floor (from between feet)   3 - Posterior scooting (move backwards 2 inches)  3 - Anterior scooting (move forward 2 inches)  3 - Lateral scooting (move to dominant side 2 inches)    TOTAL = 47/56  MCD > 5 points MCID for IP REHAB > 6 points                                                                                                                              TREATMENT DATE: 11/22/23  Ther.Act:  In // bars with gait belt and CGA: - sit to stand attempts x3- 3 successful attempt with bilateral UE support on // bars- noticeable trembling in bilateral legs for first 2 attempts, prevented pt from ambulating in // bars-unable to initiate step with R LE - Standing weight shifting laterally x multiple reps on 2 bouts of standing  Seated: - orange med ball overhead press x10 - orange med ball chest press x10 - orange med ball russian twists x10  Seated twists to place small balls (25) into basketball hoop on opposite side- 1 round on each side  Thorough rest breaks provided during session to prevent fatigue    NMR:  Seated on Dynadisc: - Lateral weight shifting x10 - Anterior/posterior weight shifting x10     PATIENT EDUCATION: Education details: Pt educated throughout session about proper posture and technique with exercises. Improved exercise technique, movement at  target joints, use of target muscles after min to mod verbal, visual, tactile cues.  Person educated: Patient Education method: Explanation, Demonstration, Tactile cues, and Verbal cues Education comprehension: verbalized understanding, returned demonstration, verbal cues required, tactile cues required, and needs further education  HOME EXERCISE PROGRAM: Stand 3x/day in stander   GOALS: Goals reviewed with patient? Yes  SHORT TERM GOALS: Target date: 08/08/2023  Patient will be independent in home exercise program to improve strength/mobility for better functional independence with ADLs.  Baseline: 4/8: compliance Goal status: MET  LONG TERM GOALS: Target date: 12/27/2023  Patient will tolerate five consecutive stands with UE support from wheelchair to standing to improve functional mobility.  Baseline: unable to perform 4/8:unable to perform full stand 5/20: perform next session 5/29: two full stands with CGA and cushion on her seat Goal status: Ongoing   2.  Patient will ambulate 10 ft with bRW with wheelchair follow.  Baseline:  unable to ambulate 4/8: unable to ambulate 5/20: able to ambulate length of // bars in previous session 5/27: ambulate 2.5 lengths of // bars with two seated rest breaks with min A for weight shift and wheelchair follow  Goal status: partially met  3.  Patient will improve FIST score >6 points to demonstrate improved stability and ability to perform ADLs.  Baseline:  3/6: 39/56 4/8: 47/ 56 5/20: 48/56 Goal status: MET  4.  Patient will perform toileting with mod I at home for improved independence.  Baseline: 3/6: requires assistance 4/8: requires dependence on machine 5/20: requires assistance with use of wipe buddy.  Goal status: Partially Met    ASSESSMENT:  CLINICAL IMPRESSION:  Pt arrived with high motivation to participate in PT today. Pt able to perform 3 successful stands in the // bars, however fatigue in legs prevented her from  ambulating today.  On initial attempt, pt noted to have shaking in the LE's, which she attributes to the medication change.   Pt continued to attempt and push for improved results.  Pt continues to make improvements in her standing tolerance and has been able to complete more successful sit-to-stands consistently for the last week. Patient will benefit from skilled physical therapy to improve mobility, stability, and independence.   OBJECTIVE IMPAIRMENTS: Abnormal gait, cardiopulmonary status limiting activity, decreased activity tolerance, decreased balance, decreased coordination, decreased endurance, decreased mobility, difficulty walking, decreased ROM, decreased strength, hypomobility, increased fascial restrictions, impaired perceived functional ability, impaired flexibility, impaired sensation, improper body mechanics, and postural dysfunction.   ACTIVITY LIMITATIONS: carrying, lifting, bending, sitting, standing, squatting, sleeping, stairs, transfers, bed mobility, continence, bathing, toileting, dressing, self feeding, reach over head, hygiene/grooming, locomotion level, and caring for others  PARTICIPATION LIMITATIONS: meal prep, cleaning, laundry, interpersonal relationship, driving, shopping, community activity, and church  PERSONAL FACTORS: Age, Past/current experiences, Time since onset of injury/illness/exacerbation, Transportation, and 3+ comorbidities: ack pain, CBP, chronic L shoulder pain, galactorrhea, neuropathy, HPV, hypercholestermeia, HTN, MS, osteopenia, PONV, wrist fracture are also affecting patient's functional outcome.   REHAB POTENTIAL: Good  CLINICAL DECISION MAKING: Evolving/moderate complexity  EVALUATION COMPLEXITY: Moderate  PLAN:  PT FREQUENCY: 2x/week  PT DURATION: 12 weeks  PLANNED INTERVENTIONS: 97164- PT Re-evaluation, 97110-Therapeutic exercises, 97530- Therapeutic activity, 97112- Neuromuscular re-education, 97535- Self Care, [id number]- Manual therapy,  [phone]- Gait training, [phone]- Orthotic Fit/training, [phone]- Canalith repositioning, [id number]- Aquatic Therapy, [phone]- Electrical stimulation (unattended), [phone]- Electrical stimulation (manual), [id number]- Ultrasound, [id number]- Traction (mechanical), Patient/Family education, Balance training, Stair training, Taping, Dry Needling, Joint mobilization, Joint manipulation, Spinal manipulation, Spinal mobilization, Scar mobilization, Compression bandaging, Vestibular training, Visual/preceptual remediation/compensation, Cognitive remediation, DME instructions, Cryotherapy, Moist heat, and Biofeedback  PLAN FOR NEXT SESSION: sit to stands; try supported walking, continue dynamic core stabilization training and seated reaching tasks to improve weight shifting.   Dereke Neumann, SPT

## 2023-11-23 ENCOUNTER — Encounter: Payer: Self-pay | Admitting: Neurology

## 2023-11-23 NOTE — Therapy (Signed)
 OUTPATIENT OCCUPATIONAL THERAPY NEURO TREATMENT NOTE  Patient Name: Lisa Williams MRN: [id number] DOB:04-10-1961, 63 y.o., female Today's Date: 11/23/2023  PCP: Dr. Layman Piety   Neurologist Dr. Suanne at [facility] REFERRING PROVIDER: Dr. Layman Piety    END OF SESSION:  OT End of Session - 11/23/23 2008     Visit Number 22    Number of Visits 44    Date for OT Re-Evaluation 02/05/24    Authorization Time Period Reporting period beginning 09/28/23-01/14/24    Progress Note Due on Visit 30    OT Start Time 1105    OT Stop Time 1145    OT Time Calculation (min) 40 min    Equipment Utilized During Treatment manual wc    Activity Tolerance Patient tolerated treatment well    Behavior During Therapy [facility] for tasks assessed/performed         Past Medical History:  Diagnosis Date   Abdominal pain, right upper quadrant    Back pain    Calculus of kidney 12/09/2013   Chronic back pain    unspecified   Chronic left shoulder pain 07/19/2015   Complication of anesthesia    Functional disorder of bladder    other   Galactorrhea 11/26/2014   Chronic    Hereditary and idiopathic neuropathy 08/19/2013   History of kidney stones    HPV test positive    Hypercholesteremia 08/19/2013   Hypertension    Incomplete bladder emptying    Microscopic hematuria    MS (multiple sclerosis) (HCC)    Muscle spasticity 05/21/2014   Nonspecific findings on examination of urine    other   Osteopenia    PONV (postoperative nausea and vomiting)    Status post laparoscopic supracervical hysterectomy 11/26/2014   Tobacco user 11/26/2014   Wrist fracture    Past Surgical History:  Procedure Laterality Date   bilateral tubal ligation  1996   BREAST CYST EXCISION Left 2002   CYST EXCISION Left 05/10/2022   Procedure: CYST REMOVAL;  Surgeon: Rodolph Romano, MD;  Location: [facility] ORS;  Service: General;  Laterality: Left;   FRACTURE SURGERY     KNEE SURGERY     right    LAPAROSCOPIC SUPRACERVICAL HYSTERECTOMY  08/05/2013   ORIF WRIST FRACTURE Left 01/17/2017   Procedure: OPEN REDUCTION INTERNAL FIXATION (ORIF) WRIST FRACTURE;  Surgeon: Leora Lynwood SAUNDERS, MD;  Location: [facility] ORS;  Service: Orthopedics;  Laterality: Left;   RADIOLOGY WITH ANESTHESIA N/A 03/18/2020   Procedure: MRI WITH ANESTHESIA CERVICAL SPINE AND BRAIN  WITH AND WITHOUT CONTRAST;  Surgeon: Radiologist, Medication, MD;  Location: [facility] OR;  Service: Radiology;  Laterality: N/A;   TUBAL LIGATION Bilateral    VAGINAL HYSTERECTOMY  03/2006   Patient Active Problem List   Diagnosis Date Noted   Sinus tachycardia 07/07/2023   Hypokalemia 07/29/2022   Weakness of both lower extremities 07/29/2022   Abscess of left groin 11/15/2021   Abnormal LFTs 11/15/2021   Acute respiratory disease due to COVID-19 virus 11/21/2020   Weakness    Hypoalbuminemia due to protein-calorie malnutrition [facility])    Neurogenic bowel    Neurogenic bladder    Labile blood pressure    Neuropathic pain    Abscess of female pelvis    SVT (supraventricular tachycardia) (HCC)    Radial styloid tenosynovitis 03/12/2018   Wheelchair dependence 02/27/2018   Localized osteoporosis with current pathological fracture with routine healing 01/19/2017   Wrist fracture 01/16/2017   Sprain of ankle 03/23/2016   Closed fracture  of lateral malleolus 03/16/2016   Health care maintenance 01/24/2016   Blood pressure elevated without history of HTN 10/25/2015   Essential hypertension 10/25/2015   Multiple sclerosis (HCC) 10/02/2015   Chronic left shoulder pain 07/19/2015   Multiple sclerosis exacerbation (HCC) 07/14/2015   MS (multiple sclerosis) (HCC) 11/26/2014   Increased body mass index 11/26/2014   HPV test positive 11/26/2014   Status post laparoscopic supracervical hysterectomy 11/26/2014   Galactorrhea 11/26/2014   Back ache 05/21/2014   Adiposity 05/21/2014   Disordered sleep 05/21/2014   Muscle spasticity 05/21/2014    Spasticity 05/21/2014   Calculus of kidney 12/09/2013   Renal colic 12/09/2013   Hypercholesteremia 08/19/2013   Hereditary and idiopathic neuropathy 08/19/2013   Hypercholesterolemia without hypertriglyceridemia 08/19/2013   Bladder infection, chronic 07/25/2012   Disorder of bladder function 07/25/2012   Incomplete bladder emptying 07/25/2012   Microscopic hematuria 07/25/2012   Right upper quadrant pain 07/25/2012   ONSET DATE: 5/24 Amon and broke R leg which caused beginning of ADL decline; diagnosed with MS in 1995)  REFERRING DIAG: MS  THERAPY DIAG:  Muscle weakness (generalized)  Other lack of coordination  Multiple sclerosis exacerbation (HCC)  Rationale for Evaluation and Treatment: Rehabilitation  SUBJECTIVE:  SUBJECTIVE STATEMENT: Pt reports doing well today, but tired from PT. Pt accompanied by: self  PERTINENT HISTORY: Pt reports increased difficulty with basic self care tasks since breaking her R leg last May.  Since that time, mobility and ADLs have declined, with pt requiring assist from private caregivers and family to manage bathing, toileting, dressing, and functional transfers.  PRECAUTIONS: Fall  WEIGHT BEARING RESTRICTIONS: No  PAIN: 11/20/23: No pain Are you having pain? No; occasional pain in R lower back, but not today  FALLS: Has patient fallen in last 6 months? No Last fall was May 17 of 2024  LIVING ENVIRONMENT: Lives with: lives with their family (including mother who has dementia and sister [name])  Lives in: 2 level home but pt resides on main level Stairs: ramp  Has following equipment at home: Wheelchair (manual), Shower bench, and hand held shower shower, 1 grab bar in the shower, 3in1 commode, sit to stand lift (electric), FWW, hospital bed  PLOF: modified indep with ADL/IADLs prior to May of 7975   PATIENT GOALS: Increase independence with basic self care tasks  OBJECTIVE:  Note: Objective measures were completed at Evaluation  unless otherwise noted.  HAND DOMINANCE: Right  ADLs:  Overall ADLs: assist provided from paid caregiver and sister Transfers/ambulation related to ADLs: sit to stand lift for all transfers, set up for sliding board in/out of bed  Eating: indep  Grooming: modified indep (wc level in mom's bathroom)  UB Dressing: set up (can't access her bedroom closet)  LB Dressing: able to sit on side of bed to don all LB clothing; assist to hike pants/underwear Toileting: assist with clothing management d/t standing within electric lift Bathing: bed bath with sister helping to wash backside Tub Shower transfers: N/A; pt has walk in shower in mom's bathroom (wc does fit in this bathroom but pt reports inability to transfer from wc and requires arm rests on a bench for a successful transfer attempt from any DME).  Tub bench is in pt's bathroom, but lift does not fit through bathroom doorway and pt reports inability to transfer up from tub bench (currently unable to manage either transfer)   Equipment: see above  IADLs: Shopping: [employer] for shopping Light housekeeping: modified indep from wc level  Meal Prep: modified indep from wc level Community mobility: relies on community transportation or family members Medication management: indep Landscape architect: indep Handwriting: NT; pt denies any [facility] challenges  MOBILITY STATUS: Hx of falls  POSTURE COMMENTS:  Rounded shoulders, forward head, anterior pelvic tilt, and weight shift left   ACTIVITY TOLERANCE: Activity tolerance: Per PT; currently standing 30-60 sec within Light Gait harness/lift; currently non-ambulatory  UPPER EXTREMITY ROM:  BUEs WFL  UPPER EXTREMITY MMT:     MMT Right eval Left eval  Shoulder flexion 4+ 4  Shoulder abduction 4+ 4  Shoulder adduction    Shoulder extension    Shoulder internal rotation 4+ 4+  Shoulder external rotation 4 4  Middle trapezius    Lower trapezius    Elbow flexion 4+ 4+  Elbow extension  4+ 4+  Wrist flexion 4+ 4+  Wrist extension 4+ 4+  Wrist ulnar deviation    Wrist radial deviation    Wrist pronation    Wrist supination    (Blank rows = not tested)  HAND FUNCTION/COORDINATION:  R/L WNL; pt denies any coordination deficits/ able to manipulate pills/clothing fasteners/etc without difficulty  11/15/23: *Measures taken d/t PT note revealing pt with increased difficulty manipulating smaller ADL supplies during reaching activities in PT session   11/15/23: Grip strength: R: 70 lbs, L: 63 lbs Pinch strength: Lateral: R: 16 lbs, L: 15 lbs; 3 point pinch: R: 12 lbs, L: 13 lbs 9 hole peg test: Right: 25 sec, L 49 sec   SENSATION: WFL  EDEMA: No visible edema in BUEs  MUSCLE TONE: BUEs WNL  COGNITION: Overall cognitive status: Within functional limits for tasks assessed  VISION: wears glasses all the time, no reports of diplopia  PERCEPTION: Not tested  PRAXIS: WFL  OBSERVATIONS:  Pt pleasant, cooperative, and motivated to work towards improving indep with ADLs.                                                                                                     TREATMENT DATE: 11/20/23 Self Care: -Simulated hiking/lowering waist of pants while seated on mat table, moving looped gait belt from knees<>waist.  Practiced narrowing BUE support to beneath thighs to simulate support available while seated on commode.  -Performed chair push ups in prep for STS/functional transfers; OT blocked knees to allow better forward weight shift and vc for engaging legs into standing prep in combination with tricep extension pushing from wc arm rests.   PATIENT EDUCATION: Education details: Forensic psychologist  Person educated: Patient Education method: Explanation, vc Education comprehension: verbalized understanding, demonstrated understanding, further training needed  HOME EXERCISE PROGRAM: IR A/AAROM to promote shoulder flexibility for peri  care/bathing  GOALS: Goals reviewed with patient? Yes  SHORT TERM GOALS: Target date: 12/25/23  Pt will perform bed bath with set up only. Baseline: Eval: Min-mod A for posterior washing; 09/28/23: Min A for posterior washing; 11/13/23: Able to manage bed bath but sister helps with thoroughness, per pt's preference; pt in agreement to trial bath sitting EOB or in wc before next OT session. Goal status:  achieved/d/c  2.  Pt will utilize sliding board for wc<>drop arm commode transfer with SBA. Baseline: Eval: Currently using electric lift for transfer to [facility]; 09/28/23: Not yet completed at home but transfers to commode have been easier without sliding board d/t pt implementing a scoot pivot. Goal status: d/c (pt does not prefer sliding board for commode transfer)  3.  Pt will be indep to perform HEP for maintaining BUE strength for ADLs and functional transfers. Baseline: Eval: HEP not yet initiated; 09/28/23: Pt is working on core stability exercises from wc, cane stretches for bilat shoulder IR, and tricep extension via wc pushups; 11/13/23: indep Goal status: achieved  LONG TERM GOALS: Target date: 02/05/24  Pt will perform sink bath with set up A. (Revised on 11/13/23 from min A to set up) Baseline: Eval: Currently bed bathing d/t inability to stand at sink without electric lift (lift does not fit into pt's bathroom); 09/28/23: still completing bed bath as pt is not yet able to stand; 11/13/23: Not yet attempted; pt encouraged to attempt before next OT session now that bed bath goal has been met Goal status: ongoing   2.  Pt will perform wc<>tub bench transfer with min A. Baseline: Eval: Unable; 09/28/23: Performed in OT clinic with min A, but not yet in the home Goal status: d/c (pt prefers sink bath)  3.  Pt will perform squat pivot transfer wc<>BSC with SBA.  Baseline: Eval: Eval: Currently using electric lift for transfer to [facility]; 09/28/23: Not yet completed in clinic with [facility], and using lift for  [facility] in the home still; 11/13/23: Still using Camie lift at home and in clinic transfers are all scoot rather than squat pivots d/t LE weakness. Goal status: ongoing  4. Pt will perform scoot transfer wc<>toilet using grab bars with supv to allow toileting in community setting.  Baseline: Eval: Pt limits community outings d/t requiring use of lift for [facility] transfers; 09/28/23: Pt has performed 2 successful scoot pivot  Transfers to toilet in OT clinic with min A, extra time.  Further trials with clothing management on toilet needed, but pt can lower and hike  pants while sititng edge of mat via lateral leaning and min A to maintain core stability; 11/13/23: Pt performs with CGA and extra time.    Goal status: ongoing  5.  Pt will complete seated clothing management with min A to enable voiding in handicapped stall within community. Baseline: Recert 11/13/23: Pt can transfer to toilet with increased time and effort, but has not yet attempted clothing management or voiding while seated on commode in community setting.  Pt can lower pants to knees while seated in wc, but requires mod A to hike over hips from seated position.   6.  Pt will tolerate standing x1 min with close supv and BUE support to manage clothing in prep for toileting. Baseline: Eval: Currently standing in electric lift only, or within parallel bars without lift; 09/28/23: Not yet attempted; 11/13/23: Pt is able to perform static standing with heavy BUE support on parallel bars with PT Goal status: in progress  7.  Pt will increase bilat grip strength by (TBD) in order to securely grasp walker sufficiently for functional transfers and ambulation.  Baseline: Recert 11/13/23: TBD (PT note read following OT session, indicating pt with difficulty securing items in hand)  Goal status: New  8.  Pt will increase bilat [facility] skills in order to manipulate small ADL supplies with reduced dropping as indicated by (TBD)  improvement in 9 hole  peg test. Baseline: Recert 11/13/23: 9 hole peg test TBD (PT note read following OT session, indicating pt with difficulty manipulating and securing items in hands)  Goal status: New  ASSESSMENT: CLINICAL IMPRESSION: Pt able to tolerate 3 reps of hiking/lowering looped theraband while seated on edge of mat to simulate clothing management on commode.  Increased difficulty when narrowing BUE support to simulate support available while seated on commode.  Pt otherwise leans to forearms on mat, which she recognized would cause her to fall if she were on the commode.  Encouraged pt wear spandex shorts beneath pants in upcoming session to attempt clothing management trial while seated on commode.  Pt agreed.  Pt will continue to benefit from skilled OT to work towards above noted goals in OT poc, working to maximize indep with daily tasks while reducing burden of care on caregivers.   PERFORMANCE DEFICITS: in functional skills including ADLs, IADLs, strength, pain, flexibility, Gross motor control, mobility, balance, body mechanics, endurance, and decreased knowledge of use of DME, and psychosocial skills including coping strategies, environmental adaptation, habits, and routines and behaviors.   IMPAIRMENTS: are limiting patient from ADLs, IADLs, and social participation.   CO-MORBIDITIES: has co-morbidities such as neuropathy, back pain, obesity, MS, HTN that affects occupational performance. Patient will benefit from skilled OT to address above impairments and improve overall function.  MODIFICATION OR ASSISTANCE TO COMPLETE EVALUATION: No modification of tasks or assist necessary to complete an evaluation.  OT OCCUPATIONAL PROFILE AND HISTORY: Detailed assessment: Review of records and additional review of physical, cognitive, psychosocial history related to current functional performance.  CLINICAL DECISION MAKING: Moderate - several treatment options, min-mod task modification necessary  REHAB  POTENTIAL: Good  EVALUATION COMPLEXITY: Moderate    PLAN:  OT FREQUENCY: 2x/week  OT DURATION: 12 weeks  PLANNED INTERVENTIONS: 97168 OT Re-evaluation, 97535 self care/ADL training, 02889 therapeutic exercise, 97530 therapeutic activity, 97112 neuromuscular re-education, 97140 manual therapy, 97116 gait training, 02989 moist heat, 97010 cryotherapy, balance training, functional mobility training, psychosocial skills training, energy conservation, coping strategies training, patient/family education, and DME and/or AE instructions  RECOMMENDED OTHER SERVICES: None at this time (Pt currently receiving PT services in this clinic)  CONSULTED AND AGREED WITH PLAN OF CARE: Patient  PLAN FOR NEXT SESSION: see above  Inocente Blazing, MS, OTR/L  Inocente MARLA Blazing, OT 11/23/2023, 8:11 PM

## 2023-11-25 NOTE — Therapy (Signed)
 OUTPATIENT OCCUPATIONAL THERAPY NEURO TREATMENT NOTE  Patient Name: Lisa Williams MRN: [id number] DOB:1960/06/11, 63 y.o., female Today's Date: 11/25/2023  PCP: Dr. Layman Piety   Neurologist Dr. Suanne at [facility] REFERRING PROVIDER: Dr. Layman Piety    END OF SESSION:  OT End of Session - 11/25/23 1332     Visit Number 23    Number of Visits 44    Date for OT Re-Evaluation 02/05/24    Authorization Time Period Reporting period beginning 09/28/23-01/14/24    Progress Note Due on Visit 30    OT Start Time 1145    OT Stop Time 1230    OT Time Calculation (min) 45 min    Equipment Utilized During Treatment manual wc    Activity Tolerance Patient tolerated treatment well    Behavior During Therapy [facility] for tasks assessed/performed         Past Medical History:  Diagnosis Date   Abdominal pain, right upper quadrant    Back pain    Calculus of kidney 12/09/2013   Chronic back pain    unspecified   Chronic left shoulder pain 07/19/2015   Complication of anesthesia    Functional disorder of bladder    other   Galactorrhea 11/26/2014   Chronic    Hereditary and idiopathic neuropathy 08/19/2013   History of kidney stones    HPV test positive    Hypercholesteremia 08/19/2013   Hypertension    Incomplete bladder emptying    Microscopic hematuria    MS (multiple sclerosis) (HCC)    Muscle spasticity 05/21/2014   Nonspecific findings on examination of urine    other   Osteopenia    PONV (postoperative nausea and vomiting)    Status post laparoscopic supracervical hysterectomy 11/26/2014   Tobacco user 11/26/2014   Wrist fracture    Past Surgical History:  Procedure Laterality Date   bilateral tubal ligation  1996   BREAST CYST EXCISION Left 2002   CYST EXCISION Left 05/10/2022   Procedure: CYST REMOVAL;  Surgeon: Rodolph Romano, MD;  Location: [facility] ORS;  Service: General;  Laterality: Left;   FRACTURE SURGERY     KNEE SURGERY     right    LAPAROSCOPIC SUPRACERVICAL HYSTERECTOMY  08/05/2013   ORIF WRIST FRACTURE Left 01/17/2017   Procedure: OPEN REDUCTION INTERNAL FIXATION (ORIF) WRIST FRACTURE;  Surgeon: Leora Lynwood SAUNDERS, MD;  Location: [facility] ORS;  Service: Orthopedics;  Laterality: Left;   RADIOLOGY WITH ANESTHESIA N/A 03/18/2020   Procedure: MRI WITH ANESTHESIA CERVICAL SPINE AND BRAIN  WITH AND WITHOUT CONTRAST;  Surgeon: Radiologist, Medication, MD;  Location: [facility] OR;  Service: Radiology;  Laterality: N/A;   TUBAL LIGATION Bilateral    VAGINAL HYSTERECTOMY  03/2006   Patient Active Problem List   Diagnosis Date Noted   Sinus tachycardia 07/07/2023   Hypokalemia 07/29/2022   Weakness of both lower extremities 07/29/2022   Abscess of left groin 11/15/2021   Abnormal LFTs 11/15/2021   Acute respiratory disease due to COVID-19 virus 11/21/2020   Weakness    Hypoalbuminemia due to protein-calorie malnutrition [facility])    Neurogenic bowel    Neurogenic bladder    Labile blood pressure    Neuropathic pain    Abscess of female pelvis    SVT (supraventricular tachycardia) (HCC)    Radial styloid tenosynovitis 03/12/2018   Wheelchair dependence 02/27/2018   Localized osteoporosis with current pathological fracture with routine healing 01/19/2017   Wrist fracture 01/16/2017   Sprain of ankle 03/23/2016   Closed fracture  of lateral malleolus 03/16/2016   Health care maintenance 01/24/2016   Blood pressure elevated without history of HTN 10/25/2015   Essential hypertension 10/25/2015   Multiple sclerosis (HCC) 10/02/2015   Chronic left shoulder pain 07/19/2015   Multiple sclerosis exacerbation (HCC) 07/14/2015   MS (multiple sclerosis) (HCC) 11/26/2014   Increased body mass index 11/26/2014   HPV test positive 11/26/2014   Status post laparoscopic supracervical hysterectomy 11/26/2014   Galactorrhea 11/26/2014   Back ache 05/21/2014   Adiposity 05/21/2014   Disordered sleep 05/21/2014   Muscle spasticity 05/21/2014    Spasticity 05/21/2014   Calculus of kidney 12/09/2013   Renal colic 12/09/2013   Hypercholesteremia 08/19/2013   Hereditary and idiopathic neuropathy 08/19/2013   Hypercholesterolemia without hypertriglyceridemia 08/19/2013   Bladder infection, chronic 07/25/2012   Disorder of bladder function 07/25/2012   Incomplete bladder emptying 07/25/2012   Microscopic hematuria 07/25/2012   Right upper quadrant pain 07/25/2012   ONSET DATE: 5/24 Amon and broke R leg which caused beginning of ADL decline; diagnosed with MS in 1995)  REFERRING DIAG: MS  THERAPY DIAG:  Muscle weakness (generalized)  Other lack of coordination  Multiple sclerosis exacerbation (HCC)  Rationale for Evaluation and Treatment: Rehabilitation  SUBJECTIVE:  SUBJECTIVE STATEMENT: Pt reports doing well today and wanting to work on reaching toward her feet.  Pt accompanied by: self  PERTINENT HISTORY: Pt reports increased difficulty with basic self care tasks since breaking her R leg last May.  Since that time, mobility and ADLs have declined, with pt requiring assist from private caregivers and family to manage bathing, toileting, dressing, and functional transfers.  PRECAUTIONS: Fall  WEIGHT BEARING RESTRICTIONS: No  PAIN: 11/22/23: No pain Are you having pain? No; occasional pain in R lower back, but not today  FALLS: Has patient fallen in last 6 months? No Last fall was May 17 of 2024  LIVING ENVIRONMENT: Lives with: lives with their family (including mother who has dementia and sister [name])  Lives in: 2 level home but pt resides on main level Stairs: ramp  Has following equipment at home: Wheelchair (manual), Shower bench, and hand held shower shower, 1 grab bar in the shower, 3in1 commode, sit to stand lift (electric), FWW, hospital bed  PLOF: modified indep with ADL/IADLs prior to May of 7975   PATIENT GOALS: Increase independence with basic self care tasks  OBJECTIVE:  Note: Objective measures  were completed at Evaluation unless otherwise noted.  HAND DOMINANCE: Right  ADLs:  Overall ADLs: assist provided from paid caregiver and sister Transfers/ambulation related to ADLs: sit to stand lift for all transfers, set up for sliding board in/out of bed  Eating: indep  Grooming: modified indep (wc level in mom's bathroom)  UB Dressing: set up (can't access her bedroom closet)  LB Dressing: able to sit on side of bed to don all LB clothing; assist to hike pants/underwear Toileting: assist with clothing management d/t standing within electric lift Bathing: bed bath with sister helping to wash backside Tub Shower transfers: N/A; pt has walk in shower in mom's bathroom (wc does fit in this bathroom but pt reports inability to transfer from wc and requires arm rests on a bench for a successful transfer attempt from any DME).  Tub bench is in pt's bathroom, but lift does not fit through bathroom doorway and pt reports inability to transfer up from tub bench (currently unable to manage either transfer)   Equipment: see above  IADLs: Shopping: [employer] for shopping Light  housekeeping: modified indep from wc level Meal Prep: modified indep from wc level Community mobility: relies on community transportation or family members Medication management: indep Landscape architect: indep Handwriting: NT; pt denies any FMC challenges  MOBILITY STATUS: Hx of falls  POSTURE COMMENTS:  Rounded shoulders, forward head, anterior pelvic tilt, and weight shift left   ACTIVITY TOLERANCE: Activity tolerance: Per PT; currently standing 30-60 sec within Light Gait harness/lift; currently non-ambulatory  UPPER EXTREMITY ROM:  BUEs WFL  UPPER EXTREMITY MMT:     MMT Right eval Left eval  Shoulder flexion 4+ 4  Shoulder abduction 4+ 4  Shoulder adduction    Shoulder extension    Shoulder internal rotation 4+ 4+  Shoulder external rotation 4 4  Middle trapezius    Lower trapezius    Elbow  flexion 4+ 4+  Elbow extension 4+ 4+  Wrist flexion 4+ 4+  Wrist extension 4+ 4+  Wrist ulnar deviation    Wrist radial deviation    Wrist pronation    Wrist supination    (Blank rows = not tested)  HAND FUNCTION/COORDINATION:  R/L WNL; pt denies any coordination deficits/ able to manipulate pills/clothing fasteners/etc without difficulty  11/15/23: *Measures taken d/t PT note revealing pt with increased difficulty manipulating smaller ADL supplies during reaching activities in PT session   11/15/23: Grip strength: R: 70 lbs, L: 63 lbs Pinch strength: Lateral: R: 16 lbs, L: 15 lbs; 3 point pinch: R: 12 lbs, L: 13 lbs 9 hole peg test: Right: 25 sec, L 49 sec   SENSATION: WFL  EDEMA: No visible edema in BUEs  MUSCLE TONE: BUEs WNL  COGNITION: Overall cognitive status: Within functional limits for tasks assessed  VISION: wears glasses all the time, no reports of diplopia  PERCEPTION: Not tested  PRAXIS: WFL  OBSERVATIONS:  Pt pleasant, cooperative, and motivated to work towards improving indep with ADLs.                                                                                                     TREATMENT DATE: 11/22/23 Therapeutic Activity: -Participation in forward/low level reaching to promote reach to feet for ADLs and increasing comfort with forward weight shift in prep for STS transfers.  Pt practiced reaching for cones around feet while sitting edge of mat, feet supported on floor, and no UE support.  -Ball kick back/forth while seated edge of mat to promote increased LE strength necessary for lifting legs during LB ADLs.  Mat table height increased to allow feet to freely swing and kick ball as pt is not yet able to lift legs with feet fully resting on floor.   Self Care: -Simulated hiking/lowering waist of pants while seated on mat table, moving looped gait belt from knees<>waist.  Practiced narrowing BUE support to beneath thighs to simulate support available  while seated on commode.  -Practiced reps of caregiver assistance with hiking/lowering pants (looped belt used) over hips while pt lifted buttocks from mat x3 reps with min vc for forward WS.    PATIENT EDUCATION: Education details: forward weight shifts Person  educated: Patient Education method: Explanation, vc Education comprehension: verbalized understanding, demonstrated understanding, further training needed  HOME EXERCISE PROGRAM: IR A/AAROM to promote shoulder flexibility for peri care/bathing  GOALS: Goals reviewed with patient? Yes  SHORT TERM GOALS: Target date: 12/25/23  Pt will perform bed bath with set up only. Baseline: Eval: Min-mod A for posterior washing; 09/28/23: Min A for posterior washing; 11/13/23: Able to manage bed bath but sister helps with thoroughness, per pt's preference; pt in agreement to trial bath sitting EOB or in wc before next OT session. Goal status: achieved/d/c  2.  Pt will utilize sliding board for wc<>drop arm commode transfer with SBA. Baseline: Eval: Currently using electric lift for transfer to [facility]; 09/28/23: Not yet completed at home but transfers to commode have been easier without sliding board d/t pt implementing a scoot pivot. Goal status: d/c (pt does not prefer sliding board for commode transfer)  3.  Pt will be indep to perform HEP for maintaining BUE strength for ADLs and functional transfers. Baseline: Eval: HEP not yet initiated; 09/28/23: Pt is working on core stability exercises from wc, cane stretches for bilat shoulder IR, and tricep extension via wc pushups; 11/13/23: indep Goal status: achieved  LONG TERM GOALS: Target date: 02/05/24  Pt will perform sink bath with set up A. (Revised on 11/13/23 from min A to set up) Baseline: Eval: Currently bed bathing d/t inability to stand at sink without electric lift (lift does not fit into pt's bathroom); 09/28/23: still completing bed bath as pt is not yet able to stand; 11/13/23: Not yet  attempted; pt encouraged to attempt before next OT session now that bed bath goal has been met Goal status: ongoing   2.  Pt will perform wc<>tub bench transfer with min A. Baseline: Eval: Unable; 09/28/23: Performed in OT clinic with min A, but not yet in the home Goal status: d/c (pt prefers sink bath)  3.  Pt will perform squat pivot transfer wc<>BSC with SBA.  Baseline: Eval: Eval: Currently using electric lift for transfer to [facility]; 09/28/23: Not yet completed in clinic with [facility], and using lift for [facility] in the home still; 11/13/23: Still using Camie lift at home and in clinic transfers are all scoot rather than squat pivots d/t LE weakness. Goal status: ongoing  4. Pt will perform scoot transfer wc<>toilet using grab bars with supv to allow toileting in community setting.  Baseline: Eval: Pt limits community outings d/t requiring use of lift for [facility] transfers; 09/28/23: Pt has performed 2 successful scoot pivot  Transfers to toilet in OT clinic with min A, extra time.  Further trials with clothing management on toilet needed, but pt can lower and hike  pants while sititng edge of mat via lateral leaning and min A to maintain core stability; 11/13/23: Pt performs with CGA and extra time.    Goal status: ongoing  5.  Pt will complete seated clothing management with min A to enable voiding in handicapped stall within community. Baseline: Recert 11/13/23: Pt can transfer to toilet with increased time and effort, but has not yet attempted clothing management or voiding while seated on commode in community setting.  Pt can lower pants to knees while seated in wc, but requires mod A to hike over hips from seated position.   6.  Pt will tolerate standing x1 min with close supv and BUE support to manage clothing in prep for toileting. Baseline: Eval: Currently standing in electric lift only, or within parallel bars  without lift; 09/28/23: Not yet attempted; 11/13/23: Pt is able to perform static standing with  heavy BUE support on parallel bars with PT Goal status: in progress  7.  Pt will increase bilat grip strength by (TBD) in order to securely grasp walker sufficiently for functional transfers and ambulation.  Baseline: Recert 11/13/23: TBD (PT note read following OT session, indicating pt with difficulty securing items in hand)  Goal status: New  8.  Pt will increase bilat [facility] skills in order to manipulate small ADL supplies with reduced dropping as indicated by (TBD) improvement in 9 hole peg test. Baseline: Recert 11/13/23: 9 hole peg test TBD (PT note read following OT session, indicating pt with difficulty manipulating and securing items in hands)  Goal status: New  ASSESSMENT: CLINICAL IMPRESSION: Pt demonstrated good ability to lift buttocks from mat table today for OT to hike/lower looped gait belt from below hips to above waist to simulate hiking/lowering clothing on commode.  Pt plans to wear her spandex shorts next session to attempt hiking/lowering pants while seated on actual commode seat.  Pt will continue to benefit from skilled OT to work towards above noted goals in OT poc, working to maximize indep with daily tasks while reducing burden of care on caregivers.   PERFORMANCE DEFICITS: in functional skills including ADLs, IADLs, strength, pain, flexibility, Gross motor control, mobility, balance, body mechanics, endurance, and decreased knowledge of use of DME, and psychosocial skills including coping strategies, environmental adaptation, habits, and routines and behaviors.   IMPAIRMENTS: are limiting patient from ADLs, IADLs, and social participation.   CO-MORBIDITIES: has co-morbidities such as neuropathy, back pain, obesity, MS, HTN that affects occupational performance. Patient will benefit from skilled OT to address above impairments and improve overall function.  MODIFICATION OR ASSISTANCE TO COMPLETE EVALUATION: No modification of tasks or assist necessary to complete an  evaluation.  OT OCCUPATIONAL PROFILE AND HISTORY: Detailed assessment: Review of records and additional review of physical, cognitive, psychosocial history related to current functional performance.  CLINICAL DECISION MAKING: Moderate - several treatment options, min-mod task modification necessary  REHAB POTENTIAL: Good  EVALUATION COMPLEXITY: Moderate    PLAN:  OT FREQUENCY: 2x/week  OT DURATION: 12 weeks  PLANNED INTERVENTIONS: 97168 OT Re-evaluation, 97535 self care/ADL training, 02889 therapeutic exercise, 97530 therapeutic activity, 97112 neuromuscular re-education, 97140 manual therapy, 97116 gait training, 02989 moist heat, 97010 cryotherapy, balance training, functional mobility training, psychosocial skills training, energy conservation, coping strategies training, patient/family education, and DME and/or AE instructions  RECOMMENDED OTHER SERVICES: None at this time (Pt currently receiving PT services in this clinic)  CONSULTED AND AGREED WITH PLAN OF CARE: Patient  PLAN FOR NEXT SESSION: see above  Inocente Blazing, MS, OTR/L  Inocente MARLA Blazing, OT 11/25/2023, 1:33 PM

## 2023-11-26 ENCOUNTER — Other Ambulatory Visit: Payer: Self-pay | Admitting: Neurology

## 2023-11-26 ENCOUNTER — Encounter: Payer: Self-pay | Admitting: Neurology

## 2023-11-26 DIAGNOSIS — G35 Multiple sclerosis: Secondary | ICD-10-CM

## 2023-11-26 DIAGNOSIS — R252 Cramp and spasm: Secondary | ICD-10-CM

## 2023-11-26 DIAGNOSIS — R29898 Other symptoms and signs involving the musculoskeletal system: Secondary | ICD-10-CM

## 2023-11-27 ENCOUNTER — Ambulatory Visit: Payer: PPO

## 2023-11-27 ENCOUNTER — Ambulatory Visit

## 2023-11-27 DIAGNOSIS — M6281 Muscle weakness (generalized): Secondary | ICD-10-CM

## 2023-11-27 DIAGNOSIS — R269 Unspecified abnormalities of gait and mobility: Secondary | ICD-10-CM

## 2023-11-27 DIAGNOSIS — G35 Multiple sclerosis: Secondary | ICD-10-CM

## 2023-11-27 DIAGNOSIS — R262 Difficulty in walking, not elsewhere classified: Secondary | ICD-10-CM

## 2023-11-27 DIAGNOSIS — R278 Other lack of coordination: Secondary | ICD-10-CM

## 2023-11-27 DIAGNOSIS — R2681 Unsteadiness on feet: Secondary | ICD-10-CM

## 2023-11-27 NOTE — Therapy (Signed)
 OUTPATIENT OCCUPATIONAL THERAPY NEURO TREATMENT NOTE  Patient Name: Lisa Williams MRN: [id number] DOB:02-07-1961, 63 y.o., female Today's Date: 11/27/2023  PCP: Dr. Layman Piety   Neurologist Dr. Suanne at [facility] REFERRING PROVIDER: Dr. Layman Piety    END OF SESSION:  OT End of Session - 11/27/23 1512     Visit Number 24    Number of Visits 44    Date for OT Re-Evaluation 02/05/24    Authorization Time Period Reporting period beginning 09/28/23-01/14/24    Progress Note Due on Visit 30    OT Start Time 1100    OT Stop Time 1140    OT Time Calculation (min) 40 min    Equipment Utilized During Treatment manual wc    Activity Tolerance Patient tolerated treatment well    Behavior During Therapy [facility] for tasks assessed/performed         Past Medical History:  Diagnosis Date   Abdominal pain, right upper quadrant    Back pain    Calculus of kidney 12/09/2013   Chronic back pain    unspecified   Chronic left shoulder pain 07/19/2015   Complication of anesthesia    Functional disorder of bladder    other   Galactorrhea 11/26/2014   Chronic    Hereditary and idiopathic neuropathy 08/19/2013   History of kidney stones    HPV test positive    Hypercholesteremia 08/19/2013   Hypertension    Incomplete bladder emptying    Microscopic hematuria    MS (multiple sclerosis) (HCC)    Muscle spasticity 05/21/2014   Nonspecific findings on examination of urine    other   Osteopenia    PONV (postoperative nausea and vomiting)    Status post laparoscopic supracervical hysterectomy 11/26/2014   Tobacco user 11/26/2014   Wrist fracture    Past Surgical History:  Procedure Laterality Date   bilateral tubal ligation  1996   BREAST CYST EXCISION Left 2002   CYST EXCISION Left 05/10/2022   Procedure: CYST REMOVAL;  Surgeon: Rodolph Romano, MD;  Location: [facility] ORS;  Service: General;  Laterality: Left;   FRACTURE SURGERY     KNEE SURGERY     right    LAPAROSCOPIC SUPRACERVICAL HYSTERECTOMY  08/05/2013   ORIF WRIST FRACTURE Left 01/17/2017   Procedure: OPEN REDUCTION INTERNAL FIXATION (ORIF) WRIST FRACTURE;  Surgeon: Leora Lynwood SAUNDERS, MD;  Location: [facility] ORS;  Service: Orthopedics;  Laterality: Left;   RADIOLOGY WITH ANESTHESIA N/A 03/18/2020   Procedure: MRI WITH ANESTHESIA CERVICAL SPINE AND BRAIN  WITH AND WITHOUT CONTRAST;  Surgeon: Radiologist, Medication, MD;  Location: [facility] OR;  Service: Radiology;  Laterality: N/A;   TUBAL LIGATION Bilateral    VAGINAL HYSTERECTOMY  03/2006   Patient Active Problem List   Diagnosis Date Noted   Sinus tachycardia 07/07/2023   Hypokalemia 07/29/2022   Weakness of both lower extremities 07/29/2022   Abscess of left groin 11/15/2021   Abnormal LFTs 11/15/2021   Acute respiratory disease due to COVID-19 virus 11/21/2020   Weakness    Hypoalbuminemia due to protein-calorie malnutrition [facility])    Neurogenic bowel    Neurogenic bladder    Labile blood pressure    Neuropathic pain    Abscess of female pelvis    SVT (supraventricular tachycardia) (HCC)    Radial styloid tenosynovitis 03/12/2018   Wheelchair dependence 02/27/2018   Localized osteoporosis with current pathological fracture with routine healing 01/19/2017   Wrist fracture 01/16/2017   Sprain of ankle 03/23/2016   Closed fracture  of lateral malleolus 03/16/2016   Health care maintenance 01/24/2016   Blood pressure elevated without history of HTN 10/25/2015   Essential hypertension 10/25/2015   Multiple sclerosis (HCC) 10/02/2015   Chronic left shoulder pain 07/19/2015   Multiple sclerosis exacerbation (HCC) 07/14/2015   MS (multiple sclerosis) (HCC) 11/26/2014   Increased body mass index 11/26/2014   HPV test positive 11/26/2014   Status post laparoscopic supracervical hysterectomy 11/26/2014   Galactorrhea 11/26/2014   Back ache 05/21/2014   Adiposity 05/21/2014   Disordered sleep 05/21/2014   Muscle spasticity 05/21/2014    Spasticity 05/21/2014   Calculus of kidney 12/09/2013   Renal colic 12/09/2013   Hypercholesteremia 08/19/2013   Hereditary and idiopathic neuropathy 08/19/2013   Hypercholesterolemia without hypertriglyceridemia 08/19/2013   Bladder infection, chronic 07/25/2012   Disorder of bladder function 07/25/2012   Incomplete bladder emptying 07/25/2012   Microscopic hematuria 07/25/2012   Right upper quadrant pain 07/25/2012   ONSET DATE: 5/24 Amon and broke R leg which caused beginning of ADL decline; diagnosed with MS in 1995)  REFERRING DIAG: MS  THERAPY DIAG:  Muscle weakness (generalized)  Other lack of coordination  Multiple sclerosis exacerbation (HCC)  Rationale for Evaluation and Treatment: Rehabilitation  SUBJECTIVE:  SUBJECTIVE STATEMENT: Pt reports her bathing routine is improving slowly, getting a little more efficient, but still taking some time to complete while sitting EOB. Pt accompanied by: self  PERTINENT HISTORY: Pt reports increased difficulty with basic self care tasks since breaking her R leg last May.  Since that time, mobility and ADLs have declined, with pt requiring assist from private caregivers and family to manage bathing, toileting, dressing, and functional transfers.  PRECAUTIONS: Fall  WEIGHT BEARING RESTRICTIONS: No  PAIN: 11/27/23: No pain Are you having pain? No; occasional pain in R lower back, but not today  FALLS: Has patient fallen in last 6 months? No Last fall was May 17 of 2024  LIVING ENVIRONMENT: Lives with: lives with their family (including mother who has dementia and sister [name])  Lives in: 2 level home but pt resides on main level Stairs: ramp  Has following equipment at home: Wheelchair (manual), Shower bench, and hand held shower shower, 1 grab bar in the shower, 3in1 commode, sit to stand lift (electric), FWW, hospital bed  PLOF: modified indep with ADL/IADLs prior to May of 7975   PATIENT GOALS: Increase independence with  basic self care tasks  OBJECTIVE:  Note: Objective measures were completed at Evaluation unless otherwise noted.  HAND DOMINANCE: Right  ADLs:  Overall ADLs: assist provided from paid caregiver and sister Transfers/ambulation related to ADLs: sit to stand lift for all transfers, set up for sliding board in/out of bed  Eating: indep  Grooming: modified indep (wc level in mom's bathroom)  UB Dressing: set up (can't access her bedroom closet)  LB Dressing: able to sit on side of bed to don all LB clothing; assist to hike pants/underwear Toileting: assist with clothing management d/t standing within electric lift Bathing: bed bath with sister helping to wash backside Tub Shower transfers: N/A; pt has walk in shower in mom's bathroom (wc does fit in this bathroom but pt reports inability to transfer from wc and requires arm rests on a bench for a successful transfer attempt from any DME).  Tub bench is in pt's bathroom, but lift does not fit through bathroom doorway and pt reports inability to transfer up from tub bench (currently unable to manage either transfer)   Equipment: see above  IADLs: Shopping: [employer] for shopping Light housekeeping: modified indep from wc level Meal Prep: modified indep from wc level Community mobility: relies on community transportation or family members Medication management: indep Landscape architect: indep Handwriting: NT; pt denies any FMC challenges  MOBILITY STATUS: Hx of falls  POSTURE COMMENTS:  Rounded shoulders, forward head, anterior pelvic tilt, and weight shift left   ACTIVITY TOLERANCE: Activity tolerance: Per PT; currently standing 30-60 sec within Light Gait harness/lift; currently non-ambulatory  UPPER EXTREMITY ROM:  BUEs WFL  UPPER EXTREMITY MMT:     MMT Right eval Left eval  Shoulder flexion 4+ 4  Shoulder abduction 4+ 4  Shoulder adduction    Shoulder extension    Shoulder internal rotation 4+ 4+  Shoulder external  rotation 4 4  Middle trapezius    Lower trapezius    Elbow flexion 4+ 4+  Elbow extension 4+ 4+  Wrist flexion 4+ 4+  Wrist extension 4+ 4+  Wrist ulnar deviation    Wrist radial deviation    Wrist pronation    Wrist supination    (Blank rows = not tested)  HAND FUNCTION/COORDINATION:  R/L WNL; pt denies any coordination deficits/ able to manipulate pills/clothing fasteners/etc without difficulty  11/15/23: *Measures taken d/t PT note revealing pt with increased difficulty manipulating smaller ADL supplies during reaching activities in PT session   11/15/23: Grip strength: R: 70 lbs, L: 63 lbs Pinch strength: Lateral: R: 16 lbs, L: 15 lbs; 3 point pinch: R: 12 lbs, L: 13 lbs 9 hole peg test: Right: 25 sec, L 49 sec   SENSATION: WFL  EDEMA: No visible edema in BUEs  MUSCLE TONE: BUEs WNL  COGNITION: Overall cognitive status: Within functional limits for tasks assessed  VISION: wears glasses all the time, no reports of diplopia  PERCEPTION: Not tested  PRAXIS: WFL  OBSERVATIONS:  Pt pleasant, cooperative, and motivated to work towards improving indep with ADLs.                                                                                                     TREATMENT DATE: 11/27/23 Therapeutic Activity: -Facilitated L hand FMC/dexterity skills working to place grooved pegs into pegboard.  Min vc to reduce compensatory movement patterns. Targeted item storage and translatory skills removing pegs 1 by 1 from pegboard, storing up to 5 in hand, and discarding 1 by 1 from palm.  Self Care: -Scoot pivot transfer wc<>commode with min vc for technique.  Once on commode, wc was moved to pt's R side for additional lateral support on the R, and grab bar on the L.  Pt performed lateral leaning to lower spandex pants to knees in prep for toileting.  Pt then hiked pants 75% of the way while seated on commode with supv only, and transferred back to wc with min A and min vc.  Pt performed  buttocks lift with BUEs pushing from arm rests of wc for OT to hike pants remaining 25% .   -Reviewed caregiver role for toileting tasks/toilet transfer in community setting, wc placement next to commode to  reduce fall risk, EC, and clothing management strategies.   PATIENT EDUCATION: Education details: toilet transfer/clothing management strategies  Person educated: Patient Education method: Explanation, vc Education comprehension: verbalized understanding, demonstrated understanding, further training needed  HOME EXERCISE PROGRAM: IR A/AAROM to promote shoulder flexibility for peri care/bathing  GOALS: Goals reviewed with patient? Yes  SHORT TERM GOALS: Target date: 12/25/23  Pt will perform bed bath with set up only. Baseline: Eval: Min-mod A for posterior washing; 09/28/23: Min A for posterior washing; 11/13/23: Able to manage bed bath but sister helps with thoroughness, per pt's preference; pt in agreement to trial bath sitting EOB or in wc before next OT session. Goal status: achieved/d/c  2.  Pt will utilize sliding board for wc<>drop arm commode transfer with SBA. Baseline: Eval: Currently using electric lift for transfer to [facility]; 09/28/23: Not yet completed at home but transfers to commode have been easier without sliding board d/t pt implementing a scoot pivot. Goal status: d/c (pt does not prefer sliding board for commode transfer)  3.  Pt will be indep to perform HEP for maintaining BUE strength for ADLs and functional transfers. Baseline: Eval: HEP not yet initiated; 09/28/23: Pt is working on core stability exercises from wc, cane stretches for bilat shoulder IR, and tricep extension via wc pushups; 11/13/23: indep Goal status: achieved  LONG TERM GOALS: Target date: 02/05/24  Pt will perform sink bath with set up A. (Revised on 11/13/23 from min A to set up) Baseline: Eval: Currently bed bathing d/t inability to stand at sink without electric lift (lift does not fit into pt's  bathroom); 09/28/23: still completing bed bath as pt is not yet able to stand; 11/13/23: Not yet attempted; pt encouraged to attempt before next OT session now that bed bath goal has been met Goal status: ongoing   2.  Pt will perform wc<>tub bench transfer with min A. Baseline: Eval: Unable; 09/28/23: Performed in OT clinic with min A, but not yet in the home Goal status: d/c (pt prefers sink bath)  3.  Pt will perform squat pivot transfer wc<>BSC with SBA.  Baseline: Eval: Eval: Currently using electric lift for transfer to [facility]; 09/28/23: Not yet completed in clinic with [facility], and using lift for [facility] in the home still; 11/13/23: Still using Camie lift at home and in clinic transfers are all scoot rather than squat pivots d/t LE weakness. Goal status: ongoing  4. Pt will perform scoot transfer wc<>toilet using grab bars with supv to allow toileting in community setting.  Baseline: Eval: Pt limits community outings d/t requiring use of lift for [facility] transfers; 09/28/23: Pt has performed 2 successful scoot pivot  Transfers to toilet in OT clinic with min A, extra time.  Further trials with clothing management on toilet needed, but pt can lower and hike  pants while sititng edge of mat via lateral leaning and min A to maintain core stability; 11/13/23: Pt performs with CGA and extra time.    Goal status: ongoing  5.  Pt will complete seated clothing management with min A to enable voiding in handicapped stall within community. Baseline: Recert 11/13/23: Pt can transfer to toilet with increased time and effort, but has not yet attempted clothing management or voiding while seated on commode in community setting.  Pt can lower pants to knees while seated in wc, but requires mod A to hike over hips from seated position.   6.  Pt will tolerate standing x1 min with close supv and BUE  support to manage clothing in prep for toileting. Baseline: Eval: Currently standing in electric lift only, or within parallel bars  without lift; 09/28/23: Not yet attempted; 11/13/23: Pt is able to perform static standing with heavy BUE support on parallel bars with PT Goal status: in progress  7.  Pt will increase bilat grip strength by (TBD) in order to securely grasp walker sufficiently for functional transfers and ambulation.  Baseline: Recert 11/13/23: TBD (PT note read following OT session, indicating pt with difficulty securing items in hand)  Goal status: New  8.  Pt will increase bilat [facility] skills in order to manipulate small ADL supplies with reduced dropping as indicated by (TBD) improvement in 9 hole peg test. Baseline: Recert 11/13/23: 9 hole peg test TBD (PT note read following OT session, indicating pt with difficulty manipulating and securing items in hands)  Goal status: New  ASSESSMENT: CLINICAL IMPRESSION: Pt is improving with transfer efficiency with wc<>toilet, completing scoot each direction in 2.5-3 min each direction, but then rest breaks are needed after each transition.  Pt continues to require reminders to engage quads to help with fewer scoots to/from commode rather than relying only on BUEs.  Pt was able to successfully lower pants today from hips to knees while seated on commode, though pt denied need to attempt voiding on commode.  Pt was then able to hike pants 75% of the way up while seated on commode, and required OT assist to finish hiking once transferring back to wc.  Transitioned to [facility] activities following ADL d/t significant fatigue.  Pt continues to demonstrate decreased [facility] skills on the L hand more so than the R, frequently dropping grooved pegs when working to repositioning them 1 by 1 within fingertips.  Pt reports that she's been using her theraputty routinely, but has not yet chosen tasks from [facility] handout.  OT encouraged digging coins from putty to target coordination skills if she prefers to use putty.  Pt will continue to benefit from skilled OT to work towards above noted goals in OT poc,  working to maximize indep with daily tasks while reducing burden of care on caregivers.   PERFORMANCE DEFICITS: in functional skills including ADLs, IADLs, strength, pain, flexibility, Gross motor control, mobility, balance, body mechanics, endurance, and decreased knowledge of use of DME, and psychosocial skills including coping strategies, environmental adaptation, habits, and routines and behaviors.   IMPAIRMENTS: are limiting patient from ADLs, IADLs, and social participation.   CO-MORBIDITIES: has co-morbidities such as neuropathy, back pain, obesity, MS, HTN that affects occupational performance. Patient will benefit from skilled OT to address above impairments and improve overall function.  MODIFICATION OR ASSISTANCE TO COMPLETE EVALUATION: No modification of tasks or assist necessary to complete an evaluation.  OT OCCUPATIONAL PROFILE AND HISTORY: Detailed assessment: Review of records and additional review of physical, cognitive, psychosocial history related to current functional performance.  CLINICAL DECISION MAKING: Moderate - several treatment options, min-mod task modification necessary  REHAB POTENTIAL: Good  EVALUATION COMPLEXITY: Moderate    PLAN:  OT FREQUENCY: 2x/week  OT DURATION: 12 weeks  PLANNED INTERVENTIONS: 97168 OT Re-evaluation, 97535 self care/ADL training, 02889 therapeutic exercise, 97530 therapeutic activity, 97112 neuromuscular re-education, 97140 manual therapy, 97116 gait training, 02989 moist heat, 97010 cryotherapy, balance training, functional mobility training, psychosocial skills training, energy conservation, coping strategies training, patient/family education, and DME and/or AE instructions  RECOMMENDED OTHER SERVICES: None at this time (Pt currently receiving PT services in this clinic)  CONSULTED AND AGREED WITH PLAN  OF CARE: Patient  PLAN FOR NEXT SESSION: see above  Inocente Blazing, MS, OTR/L  Inocente MARLA Blazing, OT 11/27/2023, 3:13  PM

## 2023-11-27 NOTE — Therapy (Signed)
 OUTPATIENT PHYSICAL THERAPY NEURO TREATMENT   Patient Name: Lisa Williams MRN: [id number] DOB:28-Sep-1960, 63 y.o., female Today's Date: 11/27/2023   PCP: Lenon Na  REFERRING PROVIDER: Lenon Na  END OF SESSION:  PT End of Session - 11/27/23 1015     Visit Number 37    Number of Visits 47    Date for PT Re-Evaluation 12/27/23   corrected   PT Start Time 1015    PT Stop Time 1057    PT Time Calculation (min) 42 min    Equipment Utilized During Treatment Gait belt    Activity Tolerance Patient tolerated treatment well;No increased pain    Behavior During Therapy [facility] for tasks assessed/performed           Past Medical History:  Diagnosis Date   Abdominal pain, right upper quadrant    Back pain    Calculus of kidney 12/09/2013   Chronic back pain    unspecified   Chronic left shoulder pain 07/19/2015   Complication of anesthesia    Functional disorder of bladder    other   Galactorrhea 11/26/2014   Chronic    Hereditary and idiopathic neuropathy 08/19/2013   History of kidney stones    HPV test positive    Hypercholesteremia 08/19/2013   Hypertension    Incomplete bladder emptying    Microscopic hematuria    MS (multiple sclerosis) (HCC)    Muscle spasticity 05/21/2014   Nonspecific findings on examination of urine    other   Osteopenia    PONV (postoperative nausea and vomiting)    Status post laparoscopic supracervical hysterectomy 11/26/2014   Tobacco user 11/26/2014   Wrist fracture    Past Surgical History:  Procedure Laterality Date   bilateral tubal ligation  1996   BREAST CYST EXCISION Left 2002   CYST EXCISION Left 05/10/2022   Procedure: CYST REMOVAL;  Surgeon: Rodolph Romano, MD;  Location: [facility] ORS;  Service: General;  Laterality: Left;   FRACTURE SURGERY     KNEE SURGERY     right   LAPAROSCOPIC SUPRACERVICAL HYSTERECTOMY  08/05/2013   ORIF WRIST FRACTURE Left 01/17/2017   Procedure: OPEN REDUCTION  INTERNAL FIXATION (ORIF) WRIST FRACTURE;  Surgeon: Leora Lynwood SAUNDERS, MD;  Location: [facility] ORS;  Service: Orthopedics;  Laterality: Left;   RADIOLOGY WITH ANESTHESIA N/A 03/18/2020   Procedure: MRI WITH ANESTHESIA CERVICAL SPINE AND BRAIN  WITH AND WITHOUT CONTRAST;  Surgeon: Radiologist, Medication, MD;  Location: [facility] OR;  Service: Radiology;  Laterality: N/A;   TUBAL LIGATION Bilateral    VAGINAL HYSTERECTOMY  03/2006   Patient Active Problem List   Diagnosis Date Noted   Sinus tachycardia 07/07/2023   Hypokalemia 07/29/2022   Weakness of both lower extremities 07/29/2022   Abscess of left groin 11/15/2021   Abnormal LFTs 11/15/2021   Acute respiratory disease due to COVID-19 virus 11/21/2020   Weakness    Hypoalbuminemia due to protein-calorie malnutrition [facility])    Neurogenic bowel    Neurogenic bladder    Labile blood pressure    Neuropathic pain    Abscess of female pelvis    SVT (supraventricular tachycardia) (HCC)    Radial styloid tenosynovitis 03/12/2018   Wheelchair dependence 02/27/2018   Localized osteoporosis with current pathological fracture with routine healing 01/19/2017   Wrist fracture 01/16/2017   Sprain of ankle 03/23/2016   Closed fracture of lateral malleolus 03/16/2016   Health care maintenance 01/24/2016   Blood pressure elevated without history of HTN 10/25/2015  Essential hypertension 10/25/2015   Multiple sclerosis (HCC) 10/02/2015   Chronic left shoulder pain 07/19/2015   Multiple sclerosis exacerbation (HCC) 07/14/2015   MS (multiple sclerosis) (HCC) 11/26/2014   Increased body mass index 11/26/2014   HPV test positive 11/26/2014   Status post laparoscopic supracervical hysterectomy 11/26/2014   Galactorrhea 11/26/2014   Back ache 05/21/2014   Adiposity 05/21/2014   Disordered sleep 05/21/2014   Muscle spasticity 05/21/2014   Spasticity 05/21/2014   Calculus of kidney 12/09/2013   Renal colic 12/09/2013   Hypercholesteremia 08/19/2013    Hereditary and idiopathic neuropathy 08/19/2013   Hypercholesterolemia without hypertriglyceridemia 08/19/2013   Bladder infection, chronic 07/25/2012   Disorder of bladder function 07/25/2012   Incomplete bladder emptying 07/25/2012   Microscopic hematuria 07/25/2012   Right upper quadrant pain 07/25/2012    ONSET DATE: 1995  REFERRING DIAG: MS  THERAPY DIAG:  Muscle weakness (generalized)  Other lack of coordination  Difficulty in walking, not elsewhere classified  Unsteadiness on feet  Abnormality of gait and mobility  Rationale for Evaluation and Treatment: Rehabilitation  SUBJECTIVE:                                                                                                                                                                                             SUBJECTIVE STATEMENT:  Pt reports that she feels pretty good today.   Pt accompanied by: self  PERTINENT HISTORY:  Patient is returning to PT s/p hospitalization.  s/p ORIF of R tibia shaft fracture 09/23/2022. Patient has weakness in BLE with RLE>LLE. She drives with hand controls. Patient has been diagnosed with MS in 1995. PMH includes: back pain, CBP, chronic L shoulder pain, galactorrhea, neuropathy, HPV, hypercholestermeia, HTN, MS, osteopenia, PONV, wrist fracture. Additional order for other closed fracture of proximal end of R tibia with routine healing. Still has to use [employer].   PAIN:  Are you having pain? Occasional pain in RLE; primarily in knee  PRECAUTIONS: Fall  RED FLAGS: None   WEIGHT BEARING RESTRICTIONS: No  FALLS: Has patient fallen in last 6 months? No  LIVING ENVIRONMENT: Lives with: lives with their family Lives in: House/apartment Stairs: ramp Has following equipment at home: Vannie - 2 wheeled, Wheelchair (manual), shower chair, and Grab bars  PLOF: Independent with household mobility with device  PATIENT GOALS: to get her independence back. To be able to get into  car, toilet, and get dressed independently  OBJECTIVE:  Note: Objective measures were completed at Evaluation unless otherwise noted.  DIAGNOSTIC FINDINGS: MRI of the brain 03/18/2020 showed multiple T2/FLAIR hyperintense foci in the periventricular, juxtacortical and deep  white matter.  There were no infratentorial lesions noted.  None of the foci enhanced.   Due to severe claustrophobia, the study was done with conscious sedation in the hospital   COGNITION: Overall cognitive status: Within functional limits for tasks assessed   SENSATION: Lack of sensation in feet, loss in bilateral lateral aspect of knee  COORDINATION: Does not have the strength for functional LE heel slide test   MUSCLE TONE: BLE mild tone    POSTURE: rounded shoulders, forward head, anterior pelvic tilt, and weight shift left   LOWER EXTREMITY MMT:    MMT Right Eval Left Eval  Hip flexion 0.9 1.5  Hip extension    Hip abduction 1.8 2.6  Hip adduction 3.1 1.9  Hip internal rotation    Hip external rotation    Knee flexion 0.9 1.5  Knee extension 0.8 1.2  Ankle dorsiflexion    Ankle plantarflexion    Ankle inversion    Ankle eversion    (Blank rows = not tested)  BED MOBILITY:  Assess in future session due to limited time  TRANSFERS: Assistive device utilized: Bariatric RW with sit to stands, slide board to table  Sit to stand: unable to reach full stand Stand to sit: unable to reach full stand  Chair to chair: slide board with CGA   GAIT: Unable to ambulate at this time.   FUNCTIONAL TESTS:  Sit to stand: tricep press with BUE; unable to bring arm to walker.    Function In Sitting Test (FIST)  (1/2 femur on surface; hips/knees flexed to 90deg)   - indicate bed or mat table / step stool if used  SCORING KEY: 4 = Independent (completes task independently & successfully) 3 = Verbal Cues/Increased Time (completes task independently & successfully and only needs more time/cues) 2 =  Upper Extremity Support (must use UE for support or assistance to complete successfully) 1 = Needs Assistance (unable to complete w/o physical assist; DOCUMENT LEVEL: min, mod, max) 0 = Dependent (requires complete physical assist; unable to complete successfully even w/ physical assist)  Randomly Administer Once Throughout Exam  4 - Anterior Nudge (superior sternum)  4 - Posterior Nudge (between scapular spines)  4 - Lateral Nudge (to dominant side at acromion)     4 - Static sitting (30 seconds)  4 - Sitting, shake 'no' (left and right)  4 - Sitting, eyes closed (30 seconds)   0 - Sitting, lift foot (dominant side, lift foot 1 inch twice)    2 - Pick up object from behind (object at midline, hands breadth posterior)  3 - Forward reach (use dominant arm, must complete full motion) 2 - Lateral reach (use dominant arm, clear opposite ischial tuberosity) 2 - Pick up object from floor (from between feet)   2 - Posterior scooting (move backwards 2 inches)  2 - Anterior scooting (move forward 2 inches)  2 - Lateral scooting (move to dominant side 2 inches)    TOTAL = 39/56  Notes/comments: slide board tranfer to/from table   MCD > 5 points MCID for IP REHAB > 6 points  Function In Sitting Test (FIST) 08/14/23 (1/2 femur on surface; hips/knees flexed to 90deg)   - indicate bed or mat table / step stool if used  SCORING KEY: 4 = Independent (completes task independently & successfully) 3 = Verbal Cues/Increased Time (completes task independently & successfully and only needs more time/cues) 2 = Upper Extremity Support (must use UE for support or assistance to complete  successfully) 1 = Needs Assistance (unable to complete w/o physical assist; DOCUMENT LEVEL: min, mod, max) 0 = Dependent (requires complete physical assist; unable to complete successfully even w/ physical assist)  Randomly Administer Once Throughout Exam  4 - Anterior Nudge (superior sternum)  4 - Posterior Nudge  (between scapular spines)  4 - Lateral Nudge (to dominant side at acromion)     4 - Static sitting (30 seconds)  4 - Sitting, shake 'no' (left and right)  4 - Sitting, eyes closed (30 seconds)   0 - Sitting, lift foot (dominant side, lift foot 1 inch twice)    4 - Pick up object from behind (object at midline, hands breadth posterior)  4 - Forward reach (use dominant arm, must complete full motion) 4 - Lateral reach (use dominant arm, clear opposite ischial tuberosity) 2 - Pick up object from floor (from between feet)   3 - Posterior scooting (move backwards 2 inches)  3 - Anterior scooting (move forward 2 inches)  3 - Lateral scooting (move to dominant side 2 inches)    TOTAL = 47/56  MCD > 5 points MCID for IP REHAB > 6 points                                                                                                                              TREATMENT DATE: 11/27/23  Ther.Act:  In // bars with gait belt and CGA: - sit to stand attempts x3- 3 successful attempt with bilateral UE support on // bars- pt able to take 3 steps forward on first bout of standing  - Standing weight shifting laterally x multiple reps on 3 bouts of standing between attempts to take steps  Seated: - blue med ball overhead press x10 - blue med ball chest press x10 - blue med ball russian twists x10   Thorough rest breaks provided during session to prevent fatigue    NMR: Seated on Dynad[employer]: - Lateral weight shifting x10 - Anterior/posterior weight shifting x10 - Static sitting w/o back support x1 minute - Hand clapping game for coordination and spatial awareness x2.5 minutes     PATIENT EDUCATION: Education details: Pt educated throughout session about proper posture and technique with exercises. Improved exercise technique, movement at target joints, use of target muscles after min to mod verbal, visual, tactile cues.  Person educated: Patient Education method: Explanation,  Demonstration, Tactile cues, and Verbal cues Education comprehension: verbalized understanding, returned demonstration, verbal cues required, tactile cues required, and needs further education  HOME EXERCISE PROGRAM: Stand 3x/day in stander   GOALS: Goals reviewed with patient? Yes  SHORT TERM GOALS: Target date: 08/08/2023  Patient will be independent in home exercise program to improve strength/mobility for better functional independence with ADLs.  Baseline: 4/8: compliance Goal status: MET    LONG TERM GOALS: Target date: 12/27/2023  Patient will tolerate five consecutive stands with UE support from wheelchair to standing to improve functional mobility.  Baseline: unable to perform 4/8:unable to perform full stand 5/20: perform next session 5/29: two full stands with CGA and cushion on her seat Goal status: Ongoing   2.  Patient will ambulate 10 ft with bRW with wheelchair follow.  Baseline:  unable to ambulate 4/8: unable to ambulate 5/20: able to ambulate length of // bars in previous session 5/27: ambulate 2.5 lengths of // bars with two seated rest breaks with min A for weight shift and wheelchair follow  Goal status: partially met  3.  Patient will improve FIST score >6 points to demonstrate improved stability and ability to perform ADLs.  Baseline:  3/6: 39/56 4/8: 47/ 56 5/20: 48/56 Goal status: MET  4.  Patient will perform toileting with mod I at home for improved independence.  Baseline: 3/6: requires assistance 4/8: requires dependence on machine 5/20: requires assistance with use of wipe buddy.  Goal status: Partially Met    ASSESSMENT:  CLINICAL IMPRESSION:  Pt arrived with high motivation to participate in therapy today. Pt able to perform 3 steps in // bars today with sit-to-stands continuing to be more consistent and smooth. Pt demonstrates tendency to maintain L knee hyperextended in standing, which is limiting her ability to perform swing phase to advance  step on L LE despite excellent weight shifting in standing. Pt remains determined throughout session to try to ambulate and is motivated to continue improving her mobility and tolerance to standing/walking. Patient will benefit from skilled physical therapy to improve mobility, stability, and independence.   OBJECTIVE IMPAIRMENTS: Abnormal gait, cardiopulmonary status limiting activity, decreased activity tolerance, decreased balance, decreased coordination, decreased endurance, decreased mobility, difficulty walking, decreased ROM, decreased strength, hypomobility, increased fascial restrictions, impaired perceived functional ability, impaired flexibility, impaired sensation, improper body mechanics, and postural dysfunction.   ACTIVITY LIMITATIONS: carrying, lifting, bending, sitting, standing, squatting, sleeping, stairs, transfers, bed mobility, continence, bathing, toileting, dressing, self feeding, reach over head, hygiene/grooming, locomotion level, and caring for others  PARTICIPATION LIMITATIONS: meal prep, cleaning, laundry, interpersonal relationship, driving, shopping, community activity, and church  PERSONAL FACTORS: Age, Past/current experiences, Time since onset of injury/illness/exacerbation, Transportation, and 3+ comorbidities: ack pain, CBP, chronic L shoulder pain, galactorrhea, neuropathy, HPV, hypercholestermeia, HTN, MS, osteopenia, PONV, wrist fracture are also affecting patient's functional outcome.   REHAB POTENTIAL: Good  CLINICAL DECISION MAKING: Evolving/moderate complexity  EVALUATION COMPLEXITY: Moderate  PLAN:  PT FREQUENCY: 2x/week  PT DURATION: 12 weeks  PLANNED INTERVENTIONS: 97164- PT Re-evaluation, 97110-Therapeutic exercises, 97530- Therapeutic activity, 97112- Neuromuscular re-education, 97535- Self Care, [id number]- Manual therapy, [phone]- Gait training, [phone]- Orthotic Fit/training, [phone]- Canalith repositioning, [id number]- Aquatic Therapy, [phone]- Electrical  stimulation (unattended), [phone]- Electrical stimulation (manual), [id number]- Ultrasound, [id number]- Traction (mechanical), Patient/Family education, Balance training, Stair training, Taping, Dry Needling, Joint mobilization, Joint manipulation, Spinal manipulation, Spinal mobilization, Scar mobilization, Compression bandaging, Vestibular training, Visual/preceptual remediation/compensation, Cognitive remediation, DME instructions, Cryotherapy, Moist heat, and Biofeedback  PLAN FOR NEXT SESSION: sit to stands; try supported walking, continue dynamic core stabilization training and seated reaching tasks to improve weight shifting.   Orange Hilligoss, SPT  This entire session was performed under direct supervision and direction of a licensed therapist/therapist assistant . I have personally read, edited and approve of the note as written.  Marina  Leopoldo, PT, DPT Physical Therapist - [place] [facility]  Outpatient Physical Therapy- Main Campus [phone]

## 2023-11-28 DIAGNOSIS — G35 Multiple sclerosis: Secondary | ICD-10-CM | POA: Diagnosis not present

## 2023-11-29 ENCOUNTER — Ambulatory Visit: Payer: PPO

## 2023-11-29 ENCOUNTER — Ambulatory Visit

## 2023-11-29 DIAGNOSIS — R2681 Unsteadiness on feet: Secondary | ICD-10-CM

## 2023-11-29 DIAGNOSIS — M6281 Muscle weakness (generalized): Secondary | ICD-10-CM | POA: Diagnosis not present

## 2023-11-29 DIAGNOSIS — R278 Other lack of coordination: Secondary | ICD-10-CM

## 2023-11-29 DIAGNOSIS — R262 Difficulty in walking, not elsewhere classified: Secondary | ICD-10-CM

## 2023-11-29 DIAGNOSIS — R269 Unspecified abnormalities of gait and mobility: Secondary | ICD-10-CM

## 2023-11-29 NOTE — Therapy (Signed)
 OUTPATIENT PHYSICAL THERAPY NEURO TREATMENT   Patient Name: Lisa Williams MRN: [id number] DOB:June 16, 1960, 63 y.o., female Today's Date: 11/29/2023   PCP: Lenon Na  REFERRING PROVIDER: Lenon Na  END OF SESSION:  PT End of Session - 11/29/23 1011     Visit Number 38    Number of Visits 47    Date for PT Re-Evaluation 12/27/23   corrected   PT Start Time 1014    PT Stop Time 1052    PT Time Calculation (min) 38 min    Equipment Utilized During Treatment Gait belt    Activity Tolerance Patient tolerated treatment well;No increased pain    Behavior During Therapy [facility] for tasks assessed/performed            Past Medical History:  Diagnosis Date   Abdominal pain, right upper quadrant    Back pain    Calculus of kidney 12/09/2013   Chronic back pain    unspecified   Chronic left shoulder pain 07/19/2015   Complication of anesthesia    Functional disorder of bladder    other   Galactorrhea 11/26/2014   Chronic    Hereditary and idiopathic neuropathy 08/19/2013   History of kidney stones    HPV test positive    Hypercholesteremia 08/19/2013   Hypertension    Incomplete bladder emptying    Microscopic hematuria    MS (multiple sclerosis) (HCC)    Muscle spasticity 05/21/2014   Nonspecific findings on examination of urine    other   Osteopenia    PONV (postoperative nausea and vomiting)    Status post laparoscopic supracervical hysterectomy 11/26/2014   Tobacco user 11/26/2014   Wrist fracture    Past Surgical History:  Procedure Laterality Date   bilateral tubal ligation  1996   BREAST CYST EXCISION Left 2002   CYST EXCISION Left 05/10/2022   Procedure: CYST REMOVAL;  Surgeon: Rodolph Romano, MD;  Location: [facility] ORS;  Service: General;  Laterality: Left;   FRACTURE SURGERY     KNEE SURGERY     right   LAPAROSCOPIC SUPRACERVICAL HYSTERECTOMY  08/05/2013   ORIF WRIST FRACTURE Left 01/17/2017   Procedure: OPEN REDUCTION  INTERNAL FIXATION (ORIF) WRIST FRACTURE;  Surgeon: Leora Lynwood SAUNDERS, MD;  Location: [facility] ORS;  Service: Orthopedics;  Laterality: Left;   RADIOLOGY WITH ANESTHESIA N/A 03/18/2020   Procedure: MRI WITH ANESTHESIA CERVICAL SPINE AND BRAIN  WITH AND WITHOUT CONTRAST;  Surgeon: Radiologist, Medication, MD;  Location: [facility] OR;  Service: Radiology;  Laterality: N/A;   TUBAL LIGATION Bilateral    VAGINAL HYSTERECTOMY  03/2006   Patient Active Problem List   Diagnosis Date Noted   Sinus tachycardia 07/07/2023   Hypokalemia 07/29/2022   Weakness of both lower extremities 07/29/2022   Abscess of left groin 11/15/2021   Abnormal LFTs 11/15/2021   Acute respiratory disease due to COVID-19 virus 11/21/2020   Weakness    Hypoalbuminemia due to protein-calorie malnutrition [facility])    Neurogenic bowel    Neurogenic bladder    Labile blood pressure    Neuropathic pain    Abscess of female pelvis    SVT (supraventricular tachycardia) (HCC)    Radial styloid tenosynovitis 03/12/2018   Wheelchair dependence 02/27/2018   Localized osteoporosis with current pathological fracture with routine healing 01/19/2017   Wrist fracture 01/16/2017   Sprain of ankle 03/23/2016   Closed fracture of lateral malleolus 03/16/2016   Health care maintenance 01/24/2016   Blood pressure elevated without history of HTN 10/25/2015  Essential hypertension 10/25/2015   Multiple sclerosis (HCC) 10/02/2015   Chronic left shoulder pain 07/19/2015   Multiple sclerosis exacerbation (HCC) 07/14/2015   MS (multiple sclerosis) (HCC) 11/26/2014   Increased body mass index 11/26/2014   HPV test positive 11/26/2014   Status post laparoscopic supracervical hysterectomy 11/26/2014   Galactorrhea 11/26/2014   Back ache 05/21/2014   Adiposity 05/21/2014   Disordered sleep 05/21/2014   Muscle spasticity 05/21/2014   Spasticity 05/21/2014   Calculus of kidney 12/09/2013   Renal colic 12/09/2013   Hypercholesteremia 08/19/2013    Hereditary and idiopathic neuropathy 08/19/2013   Hypercholesterolemia without hypertriglyceridemia 08/19/2013   Bladder infection, chronic 07/25/2012   Disorder of bladder function 07/25/2012   Incomplete bladder emptying 07/25/2012   Microscopic hematuria 07/25/2012   Right upper quadrant pain 07/25/2012    ONSET DATE: 1995  REFERRING DIAG: MS  THERAPY DIAG:  Muscle weakness (generalized)  Other lack of coordination  Difficulty in walking, not elsewhere classified  Unsteadiness on feet  Abnormality of gait and mobility  Rationale for Evaluation and Treatment: Rehabilitation  SUBJECTIVE:                                                                                                                                                                                             SUBJECTIVE STATEMENT:  Pt reports that today is not a good day. Pt's mom is currently in hospital.  Pt accompanied by: self  PERTINENT HISTORY:  Patient is returning to PT s/p hospitalization.  s/p ORIF of R tibia shaft fracture 09/23/2022. Patient has weakness in BLE with RLE>LLE. She drives with hand controls. Patient has been diagnosed with MS in 1995. PMH includes: back pain, CBP, chronic L shoulder pain, galactorrhea, neuropathy, HPV, hypercholestermeia, HTN, MS, osteopenia, PONV, wrist fracture. Additional order for other closed fracture of proximal end of R tibia with routine healing. Still has to use [employer].   PAIN:  Are you having pain? Occasional pain in RLE; primarily in knee  PRECAUTIONS: Fall  RED FLAGS: None   WEIGHT BEARING RESTRICTIONS: No  FALLS: Has patient fallen in last 6 months? No  LIVING ENVIRONMENT: Lives with: lives with their family Lives in: House/apartment Stairs: ramp Has following equipment at home: Vannie - 2 wheeled, Wheelchair (manual), shower chair, and Grab bars  PLOF: Independent with household mobility with device  PATIENT GOALS: to get her independence  back. To be able to get into car, toilet, and get dressed independently  OBJECTIVE:  Note: Objective measures were completed at Evaluation unless otherwise noted.  DIAGNOSTIC FINDINGS: MRI of the brain 03/18/2020 showed multiple T2/FLAIR hyperintense foci  in the periventricular, juxtacortical and deep white matter.  There were no infratentorial lesions noted.  None of the foci enhanced.   Due to severe claustrophobia, the study was done with conscious sedation in the hospital   COGNITION: Overall cognitive status: Within functional limits for tasks assessed   SENSATION: Lack of sensation in feet, loss in bilateral lateral aspect of knee  COORDINATION: Does not have the strength for functional LE heel slide test   MUSCLE TONE: BLE mild tone    POSTURE: rounded shoulders, forward head, anterior pelvic tilt, and weight shift left   LOWER EXTREMITY MMT:    MMT Right Eval Left Eval  Hip flexion 0.9 1.5  Hip extension    Hip abduction 1.8 2.6  Hip adduction 3.1 1.9  Hip internal rotation    Hip external rotation    Knee flexion 0.9 1.5  Knee extension 0.8 1.2  Ankle dorsiflexion    Ankle plantarflexion    Ankle inversion    Ankle eversion    (Blank rows = not tested)  BED MOBILITY:  Assess in future session due to limited time  TRANSFERS: Assistive device utilized: Bariatric RW with sit to stands, slide board to table  Sit to stand: unable to reach full stand Stand to sit: unable to reach full stand  Chair to chair: slide board with CGA   GAIT: Unable to ambulate at this time.   FUNCTIONAL TESTS:  Sit to stand: tricep press with BUE; unable to bring arm to walker.    Function In Sitting Test (FIST)  (1/2 femur on surface; hips/knees flexed to 90deg)   - indicate bed or mat table / step stool if used  SCORING KEY: 4 = Independent (completes task independently & successfully) 3 = Verbal Cues/Increased Time (completes task independently & successfully and only  needs more time/cues) 2 = Upper Extremity Support (must use UE for support or assistance to complete successfully) 1 = Needs Assistance (unable to complete w/o physical assist; DOCUMENT LEVEL: min, mod, max) 0 = Dependent (requires complete physical assist; unable to complete successfully even w/ physical assist)  Randomly Administer Once Throughout Exam  4 - Anterior Nudge (superior sternum)  4 - Posterior Nudge (between scapular spines)  4 - Lateral Nudge (to dominant side at acromion)     4 - Static sitting (30 seconds)  4 - Sitting, shake 'no' (left and right)  4 - Sitting, eyes closed (30 seconds)   0 - Sitting, lift foot (dominant side, lift foot 1 inch twice)    2 - Pick up object from behind (object at midline, hands breadth posterior)  3 - Forward reach (use dominant arm, must complete full motion) 2 - Lateral reach (use dominant arm, clear opposite ischial tuberosity) 2 - Pick up object from floor (from between feet)   2 - Posterior scooting (move backwards 2 inches)  2 - Anterior scooting (move forward 2 inches)  2 - Lateral scooting (move to dominant side 2 inches)    TOTAL = 39/56  Notes/comments: slide board tranfer to/from table   MCD > 5 points MCID for IP REHAB > 6 points  Function In Sitting Test (FIST) 08/14/23 (1/2 femur on surface; hips/knees flexed to 90deg)   - indicate bed or mat table / step stool if used  SCORING KEY: 4 = Independent (completes task independently & successfully) 3 = Verbal Cues/Increased Time (completes task independently & successfully and only needs more time/cues) 2 = Upper Extremity Support (must use UE  for support or assistance to complete successfully) 1 = Needs Assistance (unable to complete w/o physical assist; DOCUMENT LEVEL: min, mod, max) 0 = Dependent (requires complete physical assist; unable to complete successfully even w/ physical assist)  Randomly Administer Once Throughout Exam  4 - Anterior Nudge (superior  sternum)  4 - Posterior Nudge (between scapular spines)  4 - Lateral Nudge (to dominant side at acromion)     4 - Static sitting (30 seconds)  4 - Sitting, shake 'no' (left and right)  4 - Sitting, eyes closed (30 seconds)   0 - Sitting, lift foot (dominant side, lift foot 1 inch twice)    4 - Pick up object from behind (object at midline, hands breadth posterior)  4 - Forward reach (use dominant arm, must complete full motion) 4 - Lateral reach (use dominant arm, clear opposite ischial tuberosity) 2 - Pick up object from floor (from between feet)   3 - Posterior scooting (move backwards 2 inches)  3 - Anterior scooting (move forward 2 inches)  3 - Lateral scooting (move to dominant side 2 inches)    TOTAL = 47/56  MCD > 5 points MCID for IP REHAB > 6 points                                                                                                                              TREATMENT DATE: 11/29/23  Ther.Act:  In // bars with gait belt and CGA: - sit to stand attempts x3- 2 successful attempt with bilateral UE support on // bars- pt's R leg trembling on initial stand attempt but improved with weight shifting - Standing weight shifting laterally x multiple reps on 2 bouts of standing between attempts to take steps  Rowing endurance challenge at [employer] cable column x 5 minutes at 17.5#  Thorough rest breaks provided during session to prevent fatigue    NMR: Seated on Dynadisc: - Lateral weight shifting x10 - Anterior/posterior weight shifting x10 - Hand clapping game for coordination and spatial awareness x1.5 minutes     PATIENT EDUCATION: Education details: Pt educated throughout session about proper posture and technique with exercises. Improved exercise technique, movement at target joints, use of target muscles after min to mod verbal, visual, tactile cues.  Person educated: Patient Education method: Explanation, Demonstration, Tactile cues, and Verbal  cues Education comprehension: verbalized understanding, returned demonstration, verbal cues required, tactile cues required, and needs further education  HOME EXERCISE PROGRAM: Stand 3x/day in stander   GOALS: Goals reviewed with patient? Yes  SHORT TERM GOALS: Target date: 08/08/2023  Patient will be independent in home exercise program to improve strength/mobility for better functional independence with ADLs.  Baseline: 4/8: compliance Goal status: MET    LONG TERM GOALS: Target date: 12/27/2023  Patient will tolerate five consecutive stands with UE support from wheelchair to standing to improve functional mobility.  Baseline: unable to perform 4/8:unable to perform full stand 5/20: perform next  session 5/29: two full stands with CGA and cushion on her seat Goal status: Ongoing   2.  Patient will ambulate 10 ft with bRW with wheelchair follow.  Baseline:  unable to ambulate 4/8: unable to ambulate 5/20: able to ambulate length of // bars in previous session 5/27: ambulate 2.5 lengths of // bars with two seated rest breaks with min A for weight shift and wheelchair follow  Goal status: partially met  3.  Patient will improve FIST score >6 points to demonstrate improved stability and ability to perform ADLs.  Baseline:  3/6: 39/56 4/8: 47/ 56 5/20: 48/56 Goal status: MET  4.  Patient will perform toileting with mod I at home for improved independence.  Baseline: 3/6: requires assistance 4/8: requires dependence on machine 5/20: requires assistance with use of wipe buddy.  Goal status: Partially Met    ASSESSMENT:  CLINICAL IMPRESSION:  Pt is having a rough day emotionally, but was present and engaged throughout PT session today. Pt able to complete 2 successful stands in // bars today and performed 5 minutes of consecutive rowing at the cable column. Pt remains highly motivated to increase her tolerance to walking and standing. Patient will benefit from skilled physical therapy  to improve mobility, stability, and independence.   OBJECTIVE IMPAIRMENTS: Abnormal gait, cardiopulmonary status limiting activity, decreased activity tolerance, decreased balance, decreased coordination, decreased endurance, decreased mobility, difficulty walking, decreased ROM, decreased strength, hypomobility, increased fascial restrictions, impaired perceived functional ability, impaired flexibility, impaired sensation, improper body mechanics, and postural dysfunction.   ACTIVITY LIMITATIONS: carrying, lifting, bending, sitting, standing, squatting, sleeping, stairs, transfers, bed mobility, continence, bathing, toileting, dressing, self feeding, reach over head, hygiene/grooming, locomotion level, and caring for others  PARTICIPATION LIMITATIONS: meal prep, cleaning, laundry, interpersonal relationship, driving, shopping, community activity, and church  PERSONAL FACTORS: Age, Past/current experiences, Time since onset of injury/illness/exacerbation, Transportation, and 3+ comorbidities: ack pain, CBP, chronic L shoulder pain, galactorrhea, neuropathy, HPV, hypercholestermeia, HTN, MS, osteopenia, PONV, wrist fracture are also affecting patient's functional outcome.   REHAB POTENTIAL: Good  CLINICAL DECISION MAKING: Evolving/moderate complexity  EVALUATION COMPLEXITY: Moderate  PLAN:  PT FREQUENCY: 2x/week  PT DURATION: 12 weeks  PLANNED INTERVENTIONS: 97164- PT Re-evaluation, 97110-Therapeutic exercises, 97530- Therapeutic activity, 97112- Neuromuscular re-education, 97535- Self Care, [id number]- Manual therapy, [phone]- Gait training, [phone]- Orthotic Fit/training, [phone]- Canalith repositioning, [id number]- Aquatic Therapy, [phone]- Electrical stimulation (unattended), [phone]- Electrical stimulation (manual), [id number]- Ultrasound, [id number]- Traction (mechanical), Patient/Family education, Balance training, Stair training, Taping, Dry Needling, Joint mobilization, Joint manipulation, Spinal manipulation,  Spinal mobilization, Scar mobilization, Compression bandaging, Vestibular training, Visual/preceptual remediation/compensation, Cognitive remediation, DME instructions, Cryotherapy, Moist heat, and Biofeedback  PLAN FOR NEXT SESSION: sit to stands; try supported walking, continue dynamic core stabilization training and seated reaching tasks to improve weight shifting.   Kyler Lerette, SPT  This entire session was performed under direct supervision and direction of a licensed therapist/therapist assistant . I have personally read, edited and approve of the note as written.  Marina  Leopoldo, PT, DPT Physical Therapist - [facility]  Outpatient Physical Therapy- Main Campus [phone]

## 2023-12-04 ENCOUNTER — Ambulatory Visit: Payer: PPO

## 2023-12-04 ENCOUNTER — Ambulatory Visit

## 2023-12-04 DIAGNOSIS — R262 Difficulty in walking, not elsewhere classified: Secondary | ICD-10-CM

## 2023-12-04 DIAGNOSIS — R269 Unspecified abnormalities of gait and mobility: Secondary | ICD-10-CM

## 2023-12-04 DIAGNOSIS — R2689 Other abnormalities of gait and mobility: Secondary | ICD-10-CM

## 2023-12-04 DIAGNOSIS — R278 Other lack of coordination: Secondary | ICD-10-CM

## 2023-12-04 DIAGNOSIS — M6281 Muscle weakness (generalized): Secondary | ICD-10-CM

## 2023-12-04 DIAGNOSIS — G35 Multiple sclerosis: Secondary | ICD-10-CM

## 2023-12-04 DIAGNOSIS — R2681 Unsteadiness on feet: Secondary | ICD-10-CM

## 2023-12-04 NOTE — Therapy (Signed)
 OUTPATIENT PHYSICAL THERAPY NEURO TREATMENT   Patient Name: Lisa Williams MRN: [id number] DOB:[birth year], 63 y.o., female Today's Date: 12/04/2023   PCP: Lenon Na  REFERRING PROVIDER: Lenon Na  END OF SESSION:  PT End of Session - 12/04/23 1017     Visit Number 39    Number of Visits 47    Date for PT Re-Evaluation 12/27/23   corrected   PT Start Time 1017    PT Stop Time 1100    PT Time Calculation (min) 43 min    Equipment Utilized During Treatment Gait belt    Activity Tolerance Patient tolerated treatment well;No increased pain    Behavior During Therapy [facility] for tasks assessed/performed            Past Medical History:  Diagnosis Date   Abdominal pain, right upper quadrant    Back pain    Calculus of kidney 12/09/2013   Chronic back pain    unspecified   Chronic left shoulder pain 07/19/2015   Complication of anesthesia    Functional disorder of bladder    other   Galactorrhea 11/26/2014   Chronic    Hereditary and idiopathic neuropathy 08/19/2013   History of kidney stones    HPV test positive    Hypercholesteremia 08/19/2013   Hypertension    Incomplete bladder emptying    Microscopic hematuria    MS (multiple sclerosis) (HCC)    Muscle spasticity 05/21/2014   Nonspecific findings on examination of urine    other   Osteopenia    PONV (postoperative nausea and vomiting)    Status post laparoscopic supracervical hysterectomy 11/26/2014   Tobacco user 11/26/2014   Wrist fracture    Past Surgical History:  Procedure Laterality Date   bilateral tubal ligation  1996   BREAST CYST EXCISION Left 2002   CYST EXCISION Left 05/10/2022   Procedure: CYST REMOVAL;  Surgeon: Rodolph Romano, MD;  Location: [facility] ORS;  Service: General;  Laterality: Left;   FRACTURE SURGERY     KNEE SURGERY     right   LAPAROSCOPIC SUPRACERVICAL HYSTERECTOMY  08/05/2013   ORIF WRIST FRACTURE Left 01/17/2017   Procedure: OPEN REDUCTION  INTERNAL FIXATION (ORIF) WRIST FRACTURE;  Surgeon: Leora Lynwood SAUNDERS, MD;  Location: [facility] OR[facility];  Service: Orthopedics;  Laterality: Left;   RADIOLOGY WITH ANESTHESIA N/A 03/18/2020   Procedure: MRI WITH ANESTHESIA CERVICAL SPINE AND BRAIN  WITH AND WITHOUT CONTRAST;  Surgeon: Radiologist, Medication, MD;  Location: [facility] OR;  Service: Radiology;  Laterality: N/A;   TUBAL LIGATION Bilateral    VAGINAL HYSTERECTOMY  03/2006   Patient Active Problem List   Diagnosis Date Noted   Sinus tachycardia 07/07/2023   Hypokalemia 07/29/2022   Weakness of both lower extremities 07/29/2022   Abscess of left groin 11/15/2021   Abnormal LFTs 11/15/2021   Acute respiratory disease due to COVID-19 virus 11/21/2020   Weakness    Hypoalbuminemia due to protein-calorie malnutrition [facility])    Neurogenic bowel    Neurogenic bladder    Labile blood pressure    Neuropathic pain    Abscess of female pelvis    SVT (supraventricular tachycardia) (HCC)    Radial styloid tenosynovitis 03/12/2018   Wheelchair dependence 02/27/2018   Localized osteoporosis with current pathological fracture with routine healing 01/19/2017   Wrist fracture 01/16/2017   Sprain of ankle 03/23/2016   Closed fracture of lateral malleolus 03/16/2016   Health care maintenance 01/24/2016   Blood pressure elevated without history of HTN 10/25/2015  Essential hypertension 10/25/2015   Multiple sclerosis (HCC) 10/02/2015   Chronic left shoulder pain 07/19/2015   Multiple sclerosis exacerbation (HCC) 07/14/2015   MS (multiple sclerosis) (HCC) 11/26/2014   Increased body mass index 11/26/2014   HPV test positive 11/26/2014   Status post laparoscopic supracervical hysterectomy 11/26/2014   Galactorrhea 11/26/2014   Back ache 05/21/2014   Adiposity 05/21/2014   Disordered sleep 05/21/2014   Muscle spasticity 05/21/2014   Spasticity 05/21/2014   Calculus of kidney 12/09/2013   Renal colic 12/09/2013   Hypercholesteremia 08/19/2013    Hereditary and idiopathic neuropathy 08/19/2013   Hypercholesterolemia without hypertriglyceridemia 08/19/2013   Bladder infection, chronic 07/25/2012   Disorder of bladder function 07/25/2012   Incomplete bladder emptying 07/25/2012   Microscopic hematuria 07/25/2012   Right upper quadrant pain 07/25/2012    ONSET DATE: 1995  REFERRING DIAG: MS  THERAPY DIAG:  Muscle weakness (generalized)  Other lack of coordination  Difficulty in walking, not elsewhere classified  Unsteadiness on feet  Abnormality of gait and mobility  Multiple sclerosis exacerbation (HCC)  Other abnormalities of gait and mobility  Rationale for Evaluation and Treatment: Rehabilitation  SUBJECTIVE:                                                                                                                                                                                             SUBJECTIVE STATEMENT:  Pt reports nothing is new and her mom is out of the hospital.  Pt reports she lost about 20# from fluid alone.  Otherwise pt is doing well and ready to perform therapy.   Pt accompanied by: self  PERTINENT HISTORY:  Patient is returning to PT s/p hospitalization.  s/p ORIF of R tibia shaft fracture 09/23/2022. Patient has weakness in BLE with RLE>LLE. She drives with hand controls. Patient has been diagnosed with MS in 1995. PMH includes: back pain, CBP, chronic L shoulder pain, galactorrhea, neuropathy, HPV, hypercholestermeia, HTN, MS, osteopenia, PONV, wrist fracture. Additional order for other closed fracture of proximal end of R tibia with routine healing. Still has to use [employer].   PAIN:  Are you having pain? Occasional pain in RLE; primarily in knee  PRECAUTIONS: Fall  RED FLAGS: None   WEIGHT BEARING RESTRICTIONS: No  FALLS: Has patient fallen in last 6 months? No  LIVING ENVIRONMENT: Lives with: lives with their family Lives in: House/apartment Stairs: ramp Has following  equipment at home: Vannie - 2 wheeled, Wheelchair (manual), shower chair, and Grab bars  PLOF: Independent with household mobility with device  PATIENT GOALS: to get her independence back. To be able to get into  car, toilet, and get dressed independently  OBJECTIVE:  Note: Objective measures were completed at Evaluation unless otherwise noted.  DIAGNOSTIC FINDINGS: MRI of the brain 03/18/2020 showed multiple T2/FLAIR hyperintense foci in the periventricular, juxtacortical and deep white matter.  There were no infratentorial lesions noted.  None of the foci enhanced.   Due to severe claustrophobia, the study was done with conscious sedation in the hospital   COGNITION: Overall cognitive status: Within functional limits for tasks assessed   SENSATION: Lack of sensation in feet, loss in bilateral lateral aspect of knee  COORDINATION: Does not have the strength for functional LE heel slide test   MUSCLE TONE: BLE mild tone    POSTURE: rounded shoulders, forward head, anterior pelvic tilt, and weight shift left   LOWER EXTREMITY MMT:    MMT Right Eval Left Eval  Hip flexion 0.9 1.5  Hip extension    Hip abduction 1.8 2.6  Hip adduction 3.1 1.9  Hip internal rotation    Hip external rotation    Knee flexion 0.9 1.5  Knee extension 0.8 1.2  Ankle dorsiflexion    Ankle plantarflexion    Ankle inversion    Ankle eversion    (Blank rows = not tested)  BED MOBILITY:  Assess in future session due to limited time  TRANSFERS: Assistive device utilized: Bariatric RW with sit to stands, slide board to table  Sit to stand: unable to reach full stand Stand to sit: unable to reach full stand  Chair to chair: slide board with CGA   GAIT: Unable to ambulate at this time.   FUNCTIONAL TESTS:  Sit to stand: tricep press with BUE; unable to bring arm to walker.    Function In Sitting Test (FIST)  (1/2 femur on surface; hips/knees flexed to 90deg)   - indicate bed or mat table /  step stool if used  SCORING KEY: 4 = Independent (completes task independently & successfully) 3 = Verbal Cues/Increased Time (completes task independently & successfully and only needs more time/cues) 2 = Upper Extremity Support (must use UE for support or assistance to complete successfully) 1 = Needs Assistance (unable to complete w/o physical assist; DOCUMENT LEVEL: min, mod, max) 0 = Dependent (requires complete physical assist; unable to complete successfully even w/ physical assist)  Randomly Administer Once Throughout Exam  4 - Anterior Nudge (superior sternum)  4 - Posterior Nudge (between scapular spines)  4 - Lateral Nudge (to dominant side at acromion)     4 - Static sitting (30 seconds)  4 - Sitting, shake 'no' (left and right)  4 - Sitting, eyes closed (30 seconds)   0 - Sitting, lift foot (dominant side, lift foot 1 inch twice)    2 - Pick up object from behind (object at midline, hands breadth posterior)  3 - Forward reach (use dominant arm, must complete full motion) 2 - Lateral reach (use dominant arm, clear opposite ischial tuberosity) 2 - Pick up object from floor (from between feet)   2 - Posterior scooting (move backwards 2 inches)  2 - Anterior scooting (move forward 2 inches)  2 - Lateral scooting (move to dominant side 2 inches)    TOTAL = 39/56  Notes/comments: slide board tranfer to/from table   MCD > 5 points MCID for IP REHAB > 6 points  Function In Sitting Test (FIST) 08/14/23 (1/2 femur on surface; hips/knees flexed to 90deg)   - indicate bed or mat table / step stool if used  SCORING  KEY: 4 = Independent (completes task independently & successfully) 3 = Verbal Cues/Increased Time (completes task independently & successfully and only needs more time/cues) 2 = Upper Extremity Support (must use UE for support or assistance to complete successfully) 1 = Needs Assistance (unable to complete w/o physical assist; DOCUMENT LEVEL: min, mod, max) 0 =  Dependent (requires complete physical assist; unable to complete successfully even w/ physical assist)  Randomly Administer Once Throughout Exam  4 - Anterior Nudge (superior sternum)  4 - Posterior Nudge (between scapular spines)  4 - Lateral Nudge (to dominant side at acromion)     4 - Static sitting (30 seconds)  4 - Sitting, shake 'no' (left and right)  4 - Sitting, eyes closed (30 seconds)   0 - Sitting, lift foot (dominant side, lift foot 1 inch twice)    4 - Pick up object from behind (object at midline, hands breadth posterior)  4 - Forward reach (use dominant arm, must complete full motion) 4 - Lateral reach (use dominant arm, clear opposite ischial tuberosity) 2 - Pick up object from floor (from between feet)   3 - Posterior scooting (move backwards 2 inches)  3 - Anterior scooting (move forward 2 inches)  3 - Lateral scooting (move to dominant side 2 inches)    TOTAL = 47/56  MCD > 5 points MCID for IP REHAB > 6 points                                                                                                                              TREATMENT DATE: 12/04/23   TherAct:  In // bars with gait belt and CGA: STS attempts x6 total - x6 successful attempt with bilateral UE support on // bars- pt's R leg trembling on initial stand attempt but improved with weight shifting  Standing weight shifting laterally x multiple reps on 2 bouts of standing between attempts to take steps  Ambulation in // bars, 3 steps taken at each attempt, x3 attempts.  Rowing endurance challenge at [employer] cable column x10 at 17.5#, x15 at 22.5#  Seated tricep extension, 17.5#, 2x10  Seated lat pull downs, 17.# each side with bar, 2x10  All exercises performed in order to improve strengthening necessary for safe transfers  Thorough rest breaks provided during session to prevent fatigue       PATIENT EDUCATION: Education details: Pt educated throughout session about proper  posture and technique with exercises. Improved exercise technique, movement at target joints, use of target muscles after min to mod verbal, visual, tactile cues.  Person educated: Patient Education method: Explanation, Demonstration, Tactile cues, and Verbal cues Education comprehension: verbalized understanding, returned demonstration, verbal cues required, tactile cues required, and needs further education  HOME EXERCISE PROGRAM: Stand 3x/day in stander   GOALS: Goals reviewed with patient? Yes  SHORT TERM GOALS: Target date: 08/08/2023  Patient will be independent in home exercise program to improve strength/mobility for better functional  independence with ADLs.  Baseline: 4/8: compliance Goal status: MET    LONG TERM GOALS: Target date: 12/27/2023  Patient will tolerate five consecutive stands with UE support from wheelchair to standing to improve functional mobility.  Baseline: unable to perform 4/8:unable to perform full stand 5/20: perform next session 5/29: two full stands with CGA and cushion on her seat Goal status: Ongoing   2.  Patient will ambulate 10 ft with bRW with wheelchair follow.  Baseline:  unable to ambulate 4/8: unable to ambulate 5/20: able to ambulate length of // bars in previous session 5/27: ambulate 2.5 lengths of // bars with two seated rest breaks with min A for weight shift and wheelchair follow  Goal status: partially met  3.  Patient will improve FIST score >6 points to demonstrate improved stability and ability to perform ADLs.  Baseline:  3/6: 39/56 4/8: 47/ 56 5/20: 48/56 Goal status: MET  4.  Patient will perform toileting with mod I at home for improved independence.  Baseline: 3/6: requires assistance 4/8: requires dependence on machine 5/20: requires assistance with use of wipe buddy.  Goal status: Partially Met    ASSESSMENT:  CLINICAL IMPRESSION:  Pt continues to perform well and was able to progress to walking again today.  Pt noted  to continue to have trembling in the R LE when standing on the initial attempt, however resolved with every other attempt.  Pt also performed more UE strengthening exercises in order to improve ability to transfer in/out of wheelchair and perform ambulation.   Pt will continue to benefit from skilled therapy to address remaining deficits in order to improve overall QoL and return to PLOF.       OBJECTIVE IMPAIRMENTS: Abnormal gait, cardiopulmonary status limiting activity, decreased activity tolerance, decreased balance, decreased coordination, decreased endurance, decreased mobility, difficulty walking, decreased ROM, decreased strength, hypomobility, increased fascial restrictions, impaired perceived functional ability, impaired flexibility, impaired sensation, improper body mechanics, and postural dysfunction.   ACTIVITY LIMITATIONS: carrying, lifting, bending, sitting, standing, squatting, sleeping, stairs, transfers, bed mobility, continence, bathing, toileting, dressing, self feeding, reach over head, hygiene/grooming, locomotion level, and caring for others  PARTICIPATION LIMITATIONS: meal prep, cleaning, laundry, interpersonal relationship, driving, shopping, community activity, and church  PERSONAL FACTORS: Age, Past/current experiences, Time since onset of injury/illness/exacerbation, Transportation, and 3+ comorbidities: ack pain, CBP, chronic L shoulder pain, galactorrhea, neuropathy, HPV, hypercholestermeia, HTN, MS, osteopenia, PONV, wrist fracture are also affecting patient's functional outcome.   REHAB POTENTIAL: Good  CLINICAL DECISION MAKING: Evolving/moderate complexity  EVALUATION COMPLEXITY: Moderate  PLAN:  PT FREQUENCY: 2x/week  PT DURATION: 12 weeks  PLANNED INTERVENTIONS: 97164- PT Re-evaluation, 97110-Therapeutic exercises, 97530- Therapeutic activity, 97112- Neuromuscular re-education, 97535- Self Care, [id number]- Manual therapy, [phone]- Gait training, [phone]- Orthotic  Fit/training, [phone]- Canalith repositioning, [id number]- Aquatic Therapy, [phone]- Electrical stimulation (unattended), [phone]- Electrical stimulation (manual), [id number]- Ultrasound, [id number]- Traction (mechanical), Patient/Family education, Balance training, Stair training, Taping, Dry Needling, Joint mobilization, Joint manipulation, Spinal manipulation, Spinal mobilization, Scar mobilization, Compression bandaging, Vestibular training, Visual/preceptual remediation/compensation, Cognitive remediation, DME instructions, Cryotherapy, Moist heat, and Biofeedback  PLAN FOR NEXT SESSION: sit to stands; try supported walking, continue dynamic core stabilization training and seated reaching tasks to improve weight shifting.    Fonda Simpers, PT, DPT Physical Therapist - [facility]  12/04/23, 2:29 PM

## 2023-12-06 ENCOUNTER — Ambulatory Visit

## 2023-12-06 ENCOUNTER — Ambulatory Visit: Payer: PPO | Admitting: Physical Therapy

## 2023-12-06 DIAGNOSIS — R2689 Other abnormalities of gait and mobility: Secondary | ICD-10-CM

## 2023-12-06 DIAGNOSIS — G35 Multiple sclerosis: Secondary | ICD-10-CM

## 2023-12-06 DIAGNOSIS — R262 Difficulty in walking, not elsewhere classified: Secondary | ICD-10-CM

## 2023-12-06 DIAGNOSIS — M6281 Muscle weakness (generalized): Secondary | ICD-10-CM | POA: Diagnosis not present

## 2023-12-06 DIAGNOSIS — R2681 Unsteadiness on feet: Secondary | ICD-10-CM

## 2023-12-06 DIAGNOSIS — R269 Unspecified abnormalities of gait and mobility: Secondary | ICD-10-CM

## 2023-12-06 DIAGNOSIS — R278 Other lack of coordination: Secondary | ICD-10-CM

## 2023-12-06 NOTE — Therapy (Signed)
 OUTPATIENT PHYSICAL THERAPY NEURO TREATMENT/  Physical Therapy Progress Note     Dates of reporting period  10/30/2023   to   12/06/2023   Patient Name: Lisa Williams MRN: [id number] DOB:August 04, 1960, 63 y.o., female Today's Date: 12/06/2023   PCP: Lenon Na  REFERRING PROVIDER: Lenon Na  END OF SESSION:  PT End of Session - 12/06/23 1035     Visit Number 40    Number of Visits 47    Date for PT Re-Evaluation 12/27/23   corrected   PT Start Time 1025    PT Stop Time 1100    PT Time Calculation (min) 35 min    Equipment Utilized During Treatment Gait belt    Activity Tolerance Patient tolerated treatment well;No increased pain    Behavior During Therapy [facility] for tasks assessed/performed            Past Medical History:  Diagnosis Date   Abdominal pain, right upper quadrant    Back pain    Calculus of kidney 12/09/2013   Chronic back pain    unspecified   Chronic left shoulder pain 07/19/2015   Complication of anesthesia    Functional disorder of bladder    other   Galactorrhea 11/26/2014   Chronic    Hereditary and idiopathic neuropathy 08/19/2013   History of kidney stones    HPV test positive    Hypercholesteremia 08/19/2013   Hypertension    Incomplete bladder emptying    Microscopic hematuria    MS (multiple sclerosis) (HCC)    Muscle spasticity 05/21/2014   Nonspecific findings on examination of urine    other   Osteopenia    PONV (postoperative nausea and vomiting)    Status post laparoscopic supracervical hysterectomy 11/26/2014   Tobacco user 11/26/2014   Wrist fracture    Past Surgical History:  Procedure Laterality Date   bilateral tubal ligation  1996   BREAST CYST EXCISION Left 2002   CYST EXCISION Left 05/10/2022   Procedure: CYST REMOVAL;  Surgeon: Rodolph Romano, MD;  Location: [facility];  Service: General;  Laterality: Left;   FRACTURE SURGERY     KNEE SURGERY     right   LAPAROSCOPIC  SUPRACERVICAL HYSTERECTOMY  08/05/2013   ORIF WRIST FRACTURE Left 01/17/2017   Procedure: OPEN REDUCTION INTERNAL FIXATION (ORIF) WRIST FRACTURE;  Surgeon: Leora Lynwood SAUNDERS, MD;  Location: [facility];  Service: Orthopedics;  Laterality: Left;   RADIOLOGY WITH ANESTHESIA N/A 03/18/2020   Procedure: MRI WITH ANESTHESIA CERVICAL SPINE AND BRAIN  WITH AND WITHOUT CONTRAST;  Surgeon: Radiologist, Medication, MD;  Location: [facility];  Service: Radiology;  Laterality: N/A;   TUBAL LIGATION Bilateral    VAGINAL HYSTERECTOMY  03/2006   Patient Active Problem List   Diagnosis Date Noted   Sinus tachycardia 07/07/2023   Hypokalemia 07/29/2022   Weakness of both lower extremities 07/29/2022   Abscess of left groin 11/15/2021   Abnormal LFTs 11/15/2021   Acute respiratory disease due to COVID-19 virus 11/21/2020   Weakness    Hypoalbuminemia due to protein-calorie malnutrition [facility])    Neurogenic bowel    Neurogenic bladder    Labile blood pressure    Neuropathic pain    Abscess of female pelvis    SVT (supraventricular tachycardia) (HCC)    Radial styloid tenosynovitis 03/12/2018   Wheelchair dependence 02/27/2018   Localized osteoporosis with current pathological fracture with routine healing 01/19/2017   Wrist fracture 01/16/2017   Sprain of ankle 03/23/2016   Closed fracture  of lateral malleolus 03/16/2016   Health care maintenance 01/24/2016   Blood pressure elevated without history of HTN 10/25/2015   Essential hypertension 10/25/2015   Multiple sclerosis (HCC) 10/02/2015   Chronic left shoulder pain 07/19/2015   Multiple sclerosis exacerbation (HCC) 07/14/2015   MS (multiple sclerosis) (HCC) 11/26/2014   Increased body mass index 11/26/2014   HPV test positive 11/26/2014   Status post laparoscopic supracervical hysterectomy 11/26/2014   Galactorrhea 11/26/2014   Back ache 05/21/2014   Adiposity 05/21/2014   Disordered sleep 05/21/2014   Muscle spasticity 05/21/2014   Spasticity  05/21/2014   Calculus of kidney 12/09/2013   Renal colic 12/09/2013   Hypercholesteremia 08/19/2013   Hereditary and idiopathic neuropathy 08/19/2013   Hypercholesterolemia without hypertriglyceridemia 08/19/2013   Bladder infection, chronic 07/25/2012   Disorder of bladder function 07/25/2012   Incomplete bladder emptying 07/25/2012   Microscopic hematuria 07/25/2012   Right upper quadrant pain 07/25/2012    ONSET DATE: 1995  REFERRING DIAG: MS  THERAPY DIAG:  Muscle weakness (generalized)  Other lack of coordination  Difficulty in walking, not elsewhere classified  Unsteadiness on feet  Abnormality of gait and mobility  Multiple sclerosis exacerbation (HCC)  Other abnormalities of gait and mobility  Rationale for Evaluation and Treatment: Rehabilitation  SUBJECTIVE:                                                                                                                                                                                             SUBJECTIVE STATEMENT:  Pt reports nothing is new and her mom is continuing to do better.  Reports that she had a recent medication adjustment, and has noted increased Clonus of BLE since medication changes.   Pt accompanied by: self  PERTINENT HISTORY:  Patient is returning to PT s/p hospitalization.  s/p ORIF of R tibia shaft fracture 09/23/2022. Patient has weakness in BLE with RLE>LLE. She drives with hand controls. Patient has been diagnosed with MS in 1995. PMH includes: back pain, CBP, chronic L shoulder pain, galactorrhea, neuropathy, HPV, hypercholestermeia, HTN, MS, osteopenia, PONV, wrist fracture. Additional order for other closed fracture of proximal end of R tibia with routine healing. Still has to use [employer].   PAIN:  Are you having pain? Occasional pain in RLE; primarily in knee  PRECAUTIONS: Fall  RED FLAGS: None   WEIGHT BEARING RESTRICTIONS: No  FALLS: Has patient fallen in last 6 months?  No  LIVING ENVIRONMENT: Lives with: lives with their family Lives in: House/apartment Stairs: ramp Has following equipment at home: Vannie - 2 wheeled, Wheelchair (manual), shower chair, and Grab bars  PLOF:  Independent with household mobility with device  PATIENT GOALS: to get her independence back. To be able to get into car, toilet, and get dressed independently  OBJECTIVE:  Note: Objective measures were completed at Evaluation unless otherwise noted.  DIAGNOSTIC FINDINGS: MRI of the brain 03/18/2020 showed multiple T2/FLAIR hyperintense foci in the periventricular, juxtacortical and deep white matter.  There were no infratentorial lesions noted.  None of the foci enhanced.   Due to severe claustrophobia, the study was done with conscious sedation in the hospital   COGNITION: Overall cognitive status: Within functional limits for tasks assessed   SENSATION: Lack of sensation in feet, loss in bilateral lateral aspect of knee  COORDINATION: Does not have the strength for functional LE heel slide test   MUSCLE TONE: BLE mild tone    POSTURE: rounded shoulders, forward head, anterior pelvic tilt, and weight shift left   LOWER EXTREMITY MMT:    MMT Right Eval Left Eval  Hip flexion 0.9 1.5  Hip extension    Hip abduction 1.8 2.6  Hip adduction 3.1 1.9  Hip internal rotation    Hip external rotation    Knee flexion 0.9 1.5  Knee extension 0.8 1.2  Ankle dorsiflexion    Ankle plantarflexion    Ankle inversion    Ankle eversion    (Blank rows = not tested)  BED MOBILITY:  Assess in future session due to limited time  TRANSFERS: Assistive device utilized: Bariatric RW with sit to stands, slide board to table  Sit to stand: unable to reach full stand Stand to sit: unable to reach full stand  Chair to chair: slide board with CGA   GAIT: Unable to ambulate at this time.   FUNCTIONAL TESTS:  Sit to stand: tricep press with BUE; unable to bring arm to walker.     Function In Sitting Test (FIST)  (1/2 femur on surface; hips/knees flexed to 90deg)   - indicate bed or mat table / step stool if used  SCORING KEY: 4 = Independent (completes task independently & successfully) 3 = Verbal Cues/Increased Time (completes task independently & successfully and only needs more time/cues) 2 = Upper Extremity Support (must use UE for support or assistance to complete successfully) 1 = Needs Assistance (unable to complete w/o physical assist; DOCUMENT LEVEL: min, mod, max) 0 = Dependent (requires complete physical assist; unable to complete successfully even w/ physical assist)  Randomly Administer Once Throughout Exam  4 - Anterior Nudge (superior sternum)  4 - Posterior Nudge (between scapular spines)  4 - Lateral Nudge (to dominant side at acromion)     4 - Static sitting (30 seconds)  4 - Sitting, shake 'no' (left and right)  4 - Sitting, eyes closed (30 seconds)   0 - Sitting, lift foot (dominant side, lift foot 1 inch twice)    2 - Pick up object from behind (object at midline, hands breadth posterior)  3 - Forward reach (use dominant arm, must complete full motion) 2 - Lateral reach (use dominant arm, clear opposite ischial tuberosity) 2 - Pick up object from floor (from between feet)   2 - Posterior scooting (move backwards 2 inches)  2 - Anterior scooting (move forward 2 inches)  2 - Lateral scooting (move to dominant side 2 inches)    TOTAL = 39/56  Notes/comments: slide board tranfer to/from table   MCD > 5 points MCID for IP REHAB > 6 points  Function In Sitting Test (FIST) 08/14/23 (1/2 femur on  surface; hips/knees flexed to 90deg)   - indicate bed or mat table / step stool if used  SCORING KEY: 4 = Independent (completes task independently & successfully) 3 = Verbal Cues/Increased Time (completes task independently & successfully and only needs more time/cues) 2 = Upper Extremity Support (must use UE for support or assistance to  complete successfully) 1 = Needs Assistance (unable to complete w/o physical assist; DOCUMENT LEVEL: min, mod, max) 0 = Dependent (requires complete physical assist; unable to complete successfully even w/ physical assist)  Randomly Administer Once Throughout Exam  4 - Anterior Nudge (superior sternum)  4 - Posterior Nudge (between scapular spines)  4 - Lateral Nudge (to dominant side at acromion)     4 - Static sitting (30 seconds)  4 - Sitting, shake 'no' (left and right)  4 - Sitting, eyes closed (30 seconds)   0 - Sitting, lift foot (dominant side, lift foot 1 inch twice)    4 - Pick up object from behind (object at midline, hands breadth posterior)  4 - Forward reach (use dominant arm, must complete full motion) 4 - Lateral reach (use dominant arm, clear opposite ischial tuberosity)  - Pick up object from floor (from between feet)   3 - Posterior scooting (move backwards 2 inches)  3 - Anterior scooting (move forward 2 inches)  3 - Lateral scooting (move to dominant side 2 inches)    TOTAL = 47/56  MCD > 5 points MCID for IP REHAB > 6 points                                                                                                                              TREATMENT DATE: 12/06/23   TherAct:  In // bars with gait belt and CGA: STS attempts x4 total -  with bilateral UE support on // bars- pt's R leg trembling on initial stand for each bout but improved with weight shifting  Standing weight shifting laterally x multiple reps on 2 bouts of standing between attempts to take steps  Ambulation in // bars, 2 -4 steps taken at each attempt, x3 attempts.  Lateral scoot to mat table:   Function In Sitting Test (FIST) 08/14/23 (1/2 femur on surface; hips/knees flexed to 90deg)   - indicate bed or mat table / step stool if used  SCORING KEY: 4 = Independent (completes task independently & successfully) 3 = Verbal Cues/Increased Time (completes task independently &  successfully and only needs more time/cues) 2 = Upper Extremity Support (must use UE for support or assistance to complete successfully) 1 = Needs Assistance (unable to complete w/o physical assist; DOCUMENT LEVEL: min, mod, max) 0 = Dependent (requires complete physical assist; unable to complete successfully even w/ physical assist)  Randomly Administer Once Throughout Exam  4 - Anterior Nudge (superior sternum)  4 - Posterior Nudge (between scapular spines)  4 - Lateral Nudge (to dominant side at acromion)  4 - Static sitting (30 seconds)  4 - Sitting, shake 'no' (left and right)  4 - Sitting, eyes closed (30 seconds)   1 - Sitting, lift foot (dominant side, lift foot 1 inch twice)    4 - Pick up object from behind (object at midline, hands breadth posterior)  4 - Forward reach (use dominant arm, must complete full motion) 4 - Lateral reach (use dominant arm, clear opposite ischial tuberosity) 3 - Pick up object from floor (from between feet)   2 - Posterior scooting (move backwards 2 inches)  2 - Anterior scooting (move forward 2 inches)  2 - Lateral scooting (move to dominant side 2 inches)    TOTAL = 46/56  MCD > 5 points MCID for IP REHAB > 6 points   Seated cross body reaches x 15 bil.  Forward BUE reaches x 12 with varied distance bil.   Lateral scooting to WC with CGA for safety to reduce risk of anterior LOB   Sit<>stand from WC x 1 after 3 attempts. Increased clonus on first 2 attempts limits ability to attain standing. Mod assist from PT and +2 for RW stabilization.     Thorough rest breaks provided during session to prevent fatigue       PATIENT EDUCATION: Education details: Pt educated throughout session about proper posture and technique with exercises. Improved exercise technique, movement at target joints, use of target muscles after min to mod verbal, visual, tactile cues.  Person educated: Patient Education method: Explanation, Demonstration,  Tactile cues, and Verbal cues Education comprehension: verbalized understanding, returned demonstration, verbal cues required, tactile cues required, and needs further education  HOME EXERCISE PROGRAM: Stand 3x/day in stander   GOALS: Goals reviewed with patient? Yes  SHORT TERM GOALS: Target date: 08/08/2023  Patient will be independent in home exercise program to improve strength/mobility for better functional independence with ADLs.  Baseline: 4/8: compliance Goal status: MET    LONG TERM GOALS: Target date: 12/27/2023  Patient will tolerate five consecutive stands with UE support from wheelchair to standing to improve functional mobility with additional cushion and RW.  Baseline: unable to perform 4/8:unable to perform full stand 5/20: perform next session 5/29: two full stands with CGA and cushion on her seat 7/31: able to stand 3 times from [facility]. With no addition cushion  in parallel bars.  Goal status: Ongoing   2.  Patient will ambulate 10 ft with bRW with wheelchair follow.  Baseline:  unable to ambulate 4/8: unable to ambulate 5/20: able to ambulate length of // bars in previous session 5/27: ambulate 2.5 lengths of // bars with two seated rest breaks with min A for weight shift and wheelchair follow  7/31: able to ambulate in parallel bars 3 ft with min assist and +2 follow in WC> difficulty advancing the LLE on this day.  Goal status: Ongoing   3.  Patient will improve FIST score >6 points to demonstrate improved stability and ability to perform ADLs.  Baseline:  3/6: 39/56 4/8: 47/ 56 5/20: 48/56 7/31:46/56 Goal status: MET  4.  Patient will perform toileting with mod I at home for improved independence.  Baseline: 3/6: requires assistance 4/8: requires dependence on machine 5/20: requires assistance with use of wipe buddy.  7/31: continues to require use of sara steady and wipe buddy for safety and hygiene Goal status: ongoing     ASSESSMENT:  CLINICAL  IMPRESSION:  Pt continues to perform well and was able to progress to walking  again today. Schedule changes limit full time for PT treatment on this day. PT instructed pt in progress note assessment to measure progress towards LTGs. Able to demonstrate increased sit<>stand in parallel bars for 3 repetitions and gait for at least 3 ft.  Pt noted to continue to have trembling in the BLE when standing on each attempt, Which is worse than prior session. Patient's condition has the potential to improve in response to therapy. Maximum improvement is yet to be obtained. The anticipated improvement is attainable and reasonable in a generally predictable time.  Pt will continue to benefit from skilled therapy to address remaining deficits in order to improve overall QoL and return to PLOF.       OBJECTIVE IMPAIRMENTS: Abnormal gait, cardiopulmonary status limiting activity, decreased activity tolerance, decreased balance, decreased coordination, decreased endurance, decreased mobility, difficulty walking, decreased ROM, decreased strength, hypomobility, increased fascial restrictions, impaired perceived functional ability, impaired flexibility, impaired sensation, improper body mechanics, and postural dysfunction.   ACTIVITY LIMITATIONS: carrying, lifting, bending, sitting, standing, squatting, sleeping, stairs, transfers, bed mobility, continence, bathing, toileting, dressing, self feeding, reach over head, hygiene/grooming, locomotion level, and caring for others  PARTICIPATION LIMITATIONS: meal prep, cleaning, laundry, interpersonal relationship, driving, shopping, community activity, and church  PERSONAL FACTORS: Age, Past/current experiences, Time since onset of injury/illness/exacerbation, Transportation, and 3+ comorbidities: ack pain, CBP, chronic L shoulder pain, galactorrhea, neuropathy, HPV, hypercholestermeia, HTN, MS, osteopenia, PONV, wrist fracture are also affecting patient's functional  outcome.   REHAB POTENTIAL: Good  CLINICAL DECISION MAKING: Evolving/moderate complexity  EVALUATION COMPLEXITY: Moderate  PLAN:  PT FREQUENCY: 2x/week  PT DURATION: 12 weeks  PLANNED INTERVENTIONS: 97164- PT Re-evaluation, 97110-Therapeutic exercises, 97530- Therapeutic activity, 97112- Neuromuscular re-education, 97535- Self Care, [id number]- Manual therapy, [phone]- Gait training, [phone]- Orthotic Fit/training, [phone]- Canalith repositioning, [id number]- Aquatic Therapy, [phone]- Electrical stimulation (unattended), [phone]- Electrical stimulation (manual), [id number]- Ultrasound, [id number]- Traction (mechanical), Patient/Family education, Balance training, Stair training, Taping, Dry Needling, Joint mobilization, Joint manipulation, Spinal manipulation, Spinal mobilization, Scar mobilization, Compression bandaging, Vestibular training, Visual/preceptual remediation/compensation, Cognitive remediation, DME instructions, Cryotherapy, Moist heat, and Biofeedback  PLAN FOR NEXT SESSION:   sit to stands; try supported walking, continue dynamic core stabilization training and seated reaching tasks to improve weight shifting.    Massie Dollar PT, DPT  Physical Therapist - [facility]  11:44 AM 12/06/23

## 2023-12-06 NOTE — Therapy (Signed)
 OUTPATIENT OCCUPATIONAL THERAPY NEURO TREATMENT NOTE  Patient Name: Lisa Williams MRN: [id number] DOB:[birth year], 63 y.o., female Today's Date: 12/06/2023  PCP: Dr. Layman Piety   Neurologist Dr. Suanne at [facility] REFERRING PROVIDER: Dr. Layman Piety    END OF SESSION:  OT End of Session - 12/06/23 1738     Visit Number 26    Number of Visits 44    Date for OT Re-Evaluation 02/05/24    Authorization Time Period Reporting period beginning 09/28/23-01/14/24    OT Start Time 1101    OT Stop Time 1147    OT Time Calculation (min) 46 min    Activity Tolerance Patient tolerated treatment well    Behavior During Therapy [name] for tasks assessed/performed         Past Medical History:  Diagnosis Date   Abdominal pain, right upper quadrant    Back pain    Calculus of kidney 12/09/2013   Chronic back pain    unspecified   Chronic left shoulder pain 07/19/2015   Complication of anesthesia    Functional disorder of bladder    other   Galactorrhea 11/26/2014   Chronic    Hereditary and idiopathic neuropathy 08/19/2013   History of kidney stones    HPV test positive    Hypercholesteremia 08/19/2013   Hypertension    Incomplete bladder emptying    Microscopic hematuria    MS (multiple sclerosis) (HCC)    Muscle spasticity 05/21/2014   Nonspecific findings on examination of urine    other   Osteopenia    PONV (postoperative nausea and vomiting)    Status post laparoscopic supracervical hysterectomy 11/26/2014   Tobacco user 11/26/2014   Wrist fracture    Past Surgical History:  Procedure Laterality Date   bilateral tubal ligation  1996   BREAST CYST EXCISION Left 2002   CYST EXCISION Left 05/10/2022   Procedure: CYST REMOVAL;  Surgeon: Rodolph Romano, MD;  Location: [facility] ORS;  Service: General;  Laterality: Left;   FRACTURE SURGERY     KNEE SURGERY     right   LAPAROSCOPIC SUPRACERVICAL HYSTERECTOMY  08/05/2013   ORIF WRIST FRACTURE Left 01/17/2017    Procedure: OPEN REDUCTION INTERNAL FIXATION (ORIF) WRIST FRACTURE;  Surgeon: Leora Lynwood SAUNDERS, MD;  Location: [facility] ORS;  Service: Orthopedics;  Laterality: Left;   RADIOLOGY WITH ANESTHESIA N/A 03/18/2020   Procedure: MRI WITH ANESTHESIA CERVICAL SPINE AND BRAIN  WITH AND WITHOUT CONTRAST;  Surgeon: Radiologist, Medication, MD;  Location: [facility] OR;  Service: Radiology;  Laterality: N/A;   TUBAL LIGATION Bilateral    VAGINAL HYSTERECTOMY  03/2006   Patient Active Problem List   Diagnosis Date Noted   Sinus tachycardia 07/07/2023   Hypokalemia 07/29/2022   Weakness of both lower extremities 07/29/2022   Abscess of left groin 11/15/2021   Abnormal LFTs 11/15/2021   Acute respiratory disease due to COVID-19 virus 11/21/2020   Weakness    Hypoalbuminemia due to protein-calorie malnutrition [facility])    Neurogenic bowel    Neurogenic bladder    Labile blood pressure    Neuropathic pain    Abscess of female pelvis    SVT (supraventricular tachycardia) (HCC)    Radial styloid tenosynovitis 03/12/2018   Wheelchair dependence 02/27/2018   Localized osteoporosis with current pathological fracture with routine healing 01/19/2017   Wrist fracture 01/16/2017   Sprain of ankle 03/23/2016   Closed fracture of lateral malleolus 03/16/2016   Health care maintenance 01/24/2016   Blood pressure elevated without history of  HTN 10/25/2015   Essential hypertension 10/25/2015   Multiple sclerosis (HCC) 10/02/2015   Chronic left shoulder pain 07/19/2015   Multiple sclerosis exacerbation (HCC) 07/14/2015   MS (multiple sclerosis) (HCC) 11/26/2014   Increased body mass index 11/26/2014   HPV test positive 11/26/2014   Status post laparoscopic supracervical hysterectomy 11/26/2014   Galactorrhea 11/26/2014   Back ache 05/21/2014   Adiposity 05/21/2014   Disordered sleep 05/21/2014   Muscle spasticity 05/21/2014   Spasticity 05/21/2014   Calculus of kidney 12/09/2013   Renal colic 12/09/2013    Hypercholesteremia 08/19/2013   Hereditary and idiopathic neuropathy 08/19/2013   Hypercholesterolemia without hypertriglyceridemia 08/19/2013   Bladder infection, chronic 07/25/2012   Disorder of bladder function 07/25/2012   Incomplete bladder emptying 07/25/2012   Microscopic hematuria 07/25/2012   Right upper quadrant pain 07/25/2012   ONSET DATE: 5/24 Amon and broke R leg which caused beginning of ADL decline; diagnosed with MS in 1995)  REFERRING DIAG: MS  THERAPY DIAG:  Muscle weakness (generalized)  Other lack of coordination  Multiple sclerosis exacerbation (HCC)  Rationale for Evaluation and Treatment: Rehabilitation  SUBJECTIVE:  SUBJECTIVE STATEMENT: See below in patient treatment note for subjective Pt accompanied by: self  PERTINENT HISTORY: Pt reports increased difficulty with basic self care tasks since breaking her R leg last May.  Since that time, mobility and ADLs have declined, with pt requiring assist from private caregivers and family to manage bathing, toileting, dressing, and functional transfers.  PRECAUTIONS: Fall  WEIGHT BEARING RESTRICTIONS: No  PAIN: 11/27/23: No pain Are you having pain? No; occasional pain in R lower back, but not today  FALLS: Has patient fallen in last 6 months? No Last fall was May 17 of 2024  LIVING ENVIRONMENT: Lives with: lives with their family (including mother who has dementia and sister [name])  Lives in: 2 level home but pt resides on main level Stairs: ramp  Has following equipment at home: Wheelchair (manual), Shower bench, and hand held shower shower, 1 grab bar in the shower, 3in1 commode, sit to stand lift (electric), FWW, hospital bed  PLOF: modified indep with ADL/IADLs prior to May of 7975   PATIENT GOALS: Increase independence with basic self care tasks  OBJECTIVE:  Note: Objective measures were completed at Evaluation unless otherwise noted.  HAND DOMINANCE: Right  ADLs:  Overall ADLs: assist  provided from paid caregiver and sister Transfers/ambulation related to ADLs: sit to stand lift for all transfers, set up for sliding board in/out of bed  Eating: indep  Grooming: modified indep (wc level in mom's bathroom)  UB Dressing: set up (can't access her bedroom closet)  LB Dressing: able to sit on side of bed to don all LB clothing; assist to hike pants/underwear Toileting: assist with clothing management d/t standing within electric lift Bathing: bed bath with sister helping to wash backside Tub Shower transfers: N/A; pt has walk in shower in mom's bathroom (wc does fit in this bathroom but pt reports inability to transfer from wc and requires arm rests on a bench for a successful transfer attempt from any DME).  Tub bench is in pt's bathroom, but lift does not fit through bathroom doorway and pt reports inability to transfer up from tub bench (currently unable to manage either transfer)   Equipment: see above  IADLs: Shopping: [employer] for shopping Light housekeeping: modified indep from wc level Meal Prep: modified indep from wc level Community mobility: relies on community transportation or family members Medication management: indep  Financial management: indep Handwriting: NT; pt denies any [facility] challenges  MOBILITY STATUS: Hx of falls  POSTURE COMMENTS:  Rounded shoulders, forward head, anterior pelvic tilt, and weight shift left   ACTIVITY TOLERANCE: Activity tolerance: Per PT; currently standing 30-60 sec within Light Gait harness/lift; currently non-ambulatory  UPPER EXTREMITY ROM:  BUEs WFL  UPPER EXTREMITY MMT:     MMT Right eval Left eval  Shoulder flexion 4+ 4  Shoulder abduction 4+ 4  Shoulder adduction    Shoulder extension    Shoulder internal rotation 4+ 4+  Shoulder external rotation 4 4  Middle trapezius    Lower trapezius    Elbow flexion 4+ 4+  Elbow extension 4+ 4+  Wrist flexion 4+ 4+  Wrist extension 4+ 4+  Wrist ulnar deviation     Wrist radial deviation    Wrist pronation    Wrist supination    (Blank rows = not tested)  HAND FUNCTION/COORDINATION:  R/L WNL; pt denies any coordination deficits/ able to manipulate pills/clothing fasteners/etc without difficulty  11/15/23: *Measures taken d/t PT note revealing pt with increased difficulty manipulating smaller ADL supplies during reaching activities in PT session   11/15/23: Grip strength: R: 70 lbs, L: 63 lbs Pinch strength: Lateral: R: 16 lbs, L: 15 lbs; 3 point pinch: R: 12 lbs, L: 13 lbs 9 hole peg test: Right: 25 sec, L 49 sec   SENSATION: WFL  EDEMA: No visible edema in BUEs  MUSCLE TONE: BUEs WNL  COGNITION: Overall cognitive status: Within functional limits for tasks assessed  VISION: wears glasses all the time, no reports of diplopia  PERCEPTION: Not tested  PRAXIS: WFL  OBSERVATIONS:  Pt pleasant, cooperative, and motivated to work towards improving indep with ADLs.                                                                                                     TREATMENT DATE: 12/06/23  Patient reports she would like to work on core strength, stability and coordination.  At the end of session patient states she got a good workout with both therapies today.  Stating  this is harder than it looks  Therapeutic Activity: Patient seen this date for reaching tasks utilizing 4 level shaped tower.  Therapist presenting objects in a variety of planes of motion to encourage increased weight shifting from right to left, forward to back as well as extended reach.  Activities encouraged core activation, stabilization.  Occasional cues for weight shifting. Patient seen for focus on manipulation of jumbo pegs to place and remove from large duty board with alternating hand patterns as well as turning and flipping cylinders from end to end.   Therapeutic exercise: Patient seen for upper extremity strengthening with use of dumbbells.  Right upper extremity  4 pounds, left upper extremity 3 pounds for overhead press, chest press,diagonal patterns, elbow flexion/extension, supination and pronation, 10 repetitions for 2 sets.  Cues for proper form and technique, rest breaks as needed   PATIENT EDUCATION: Education details: toilet transfer/clothing management strategies  Person educated: Patient Education method: Explanation, vc  Education comprehension: verbalized understanding, demonstrated understanding, further training needed  HOME EXERCISE PROGRAM: IR A/AAROM to promote shoulder flexibility for peri care/bathing  GOALS: Goals reviewed with patient? Yes  SHORT TERM GOALS: Target date: 12/25/23  Pt will perform bed bath with set up only. Baseline: Eval: Min-mod A for posterior washing; 09/28/23: Min A for posterior washing; 11/13/23: Able to manage bed bath but sister helps with thoroughness, per pt's preference; pt in agreement to trial bath sitting EOB or in wc before next OT session. Goal status: achieved/d/c  2.  Pt will utilize sliding board for wc<>drop arm commode transfer with SBA. Baseline: Eval: Currently using electric lift for transfer to [facility]; 09/28/23: Not yet completed at home but transfers to commode have been easier without sliding board d/t pt implementing a scoot pivot. Goal status: d/c (pt does not prefer sliding board for commode transfer)  3.  Pt will be indep to perform HEP for maintaining BUE strength for ADLs and functional transfers. Baseline: Eval: HEP not yet initiated; 09/28/23: Pt is working on core stability exercises from wc, cane stretches for bilat shoulder IR, and tricep extension via wc pushups; 11/13/23: indep Goal status: achieved  LONG TERM GOALS: Target date: 02/05/24  Pt will perform sink bath with set up A. (Revised on 11/13/23 from min A to set up) Baseline: Eval: Currently bed bathing d/t inability to stand at sink without electric lift (lift does not fit into pt's bathroom); 09/28/23: still completing bed  bath as pt is not yet able to stand; 11/13/23: Not yet attempted; pt encouraged to attempt before next OT session now that bed bath goal has been met Goal status: ongoing   2.  Pt will perform wc<>tub bench transfer with min A. Baseline: Eval: Unable; 09/28/23: Performed in OT clinic with min A, but not yet in the home Goal status: d/c (pt prefers sink bath)  3.  Pt will perform squat pivot transfer wc<>BSC with SBA.  Baseline: Eval: Eval: Currently using electric lift for transfer to [facility]; 09/28/23: Not yet completed in clinic with [facility], and using lift for [facility] in the home still; 11/13/23: Still using Camie lift at home and in clinic transfers are all scoot rather than squat pivots d/t LE weakness. Goal status: ongoing  4. Pt will perform scoot transfer wc<>toilet using grab bars with supv to allow toileting in community setting.  Baseline: Eval: Pt limits community outings d/t requiring use of lift for [facility] transfers; 09/28/23: Pt has performed 2 successful scoot pivot  Transfers to toilet in OT clinic with min A, extra time.  Further trials with clothing management on toilet needed, but pt can lower and hike  pants while sititng edge of mat via lateral leaning and min A to maintain core stability; 11/13/23: Pt performs with CGA and extra time.    Goal status: ongoing  5.  Pt will complete seated clothing management with min A to enable voiding in handicapped stall within community. Baseline: Recert 11/13/23: Pt can transfer to toilet with increased time and effort, but has not yet attempted clothing management or voiding while seated on commode in community setting.  Pt can lower pants to knees while seated in wc, but requires mod A to hike over hips from seated position.   6.  Pt will tolerate standing x1 min with close supv and BUE support to manage clothing in prep for toileting. Baseline: Eval: Currently standing in electric lift only, or within parallel bars without lift; 09/28/23: Not yet attempted;  11/13/23: Pt is able to perform static standing with heavy BUE support on parallel bars with PT Goal status: in progress  7.  Pt will increase bilat grip strength by (TBD) in order to securely grasp walker sufficiently for functional transfers and ambulation.  Baseline: Recert 11/13/23: TBD (PT note read following OT session, indicating pt with difficulty securing items in hand)  Goal status: New  8.  Pt will increase bilat [facility] skills in order to manipulate small ADL supplies with reduced dropping as indicated by (TBD) improvement in 9 hole peg test. Baseline: Recert 11/13/23: 9 hole peg test TBD (PT note read following OT session, indicating pt with difficulty manipulating and securing items in hands)  Goal status: New  ASSESSMENT: CLINICAL IMPRESSION: Patient requesting to work on core strength, stability and coordination for functional tasks.  Patient was challenged by weight shifting and core activation with reaching patterns.  Able to tolerate 4 pound dumbbells on the right upper extremity and 3 pound dumbbells on the left for upper extremity strengthening exercises.  Patient responds well to cues for coordination with manipulation and prehension patterns.  Pt will continue to benefit from skilled OT to work towards above noted goals in OT poc, working to maximize indep with daily tasks while reducing burden of care on caregivers.   PERFORMANCE DEFICITS: in functional skills including ADLs, IADLs, strength, pain, flexibility, Gross motor control, mobility, balance, body mechanics, endurance, and decreased knowledge of use of DME, and psychosocial skills including coping strategies, environmental adaptation, habits, and routines and behaviors.   IMPAIRMENTS: are limiting patient from ADLs, IADLs, and social participation.   CO-MORBIDITIES: has co-morbidities such as neuropathy, back pain, obesity, MS, HTN that affects occupational performance. Patient will benefit from skilled OT to address above  impairments and improve overall function.  MODIFICATION OR ASSISTANCE TO COMPLETE EVALUATION: No modification of tasks or assist necessary to complete an evaluation.  OT OCCUPATIONAL PROFILE AND HISTORY: Detailed assessment: Review of records and additional review of physical, cognitive, psychosocial history related to current functional performance.  CLINICAL DECISION MAKING: Moderate - several treatment options, min-mod task modification necessary  REHAB POTENTIAL: Good  EVALUATION COMPLEXITY: Moderate   PLAN:  OT FREQUENCY: 2x/week  OT DURATION: 12 weeks  PLANNED INTERVENTIONS: 97168 OT Re-evaluation, 97535 self care/ADL training, 02889 therapeutic exercise, 97530 therapeutic activity, 97112 neuromuscular re-education, 97140 manual therapy, 97116 gait training, 02989 moist heat, 97010 cryotherapy, balance training, functional mobility training, psychosocial skills training, energy conservation, coping strategies training, patient/family education, and DME and/or AE instructions  RECOMMENDED OTHER SERVICES: None at this time (Pt currently receiving PT services in this clinic)  CONSULTED AND AGREED WITH PLAN OF CARE: Patient  PLAN FOR NEXT SESSION: see above  Destine Zirkle T Thoren Hosang, OTR/L, CLT   Nayshawn Mesta, OT 12/06/2023, 5:39 PM

## 2023-12-06 NOTE — Therapy (Addendum)
 OUTPATIENT OCCUPATIONAL THERAPY NEURO TREATMENT NOTE  Patient Name: Lisa Williams MRN: [id number] DOB:[birth year], 63 y.o., female Today's Date: 12/06/2023  PCP: Dr. Layman Piety   Neurologist Dr. Suanne at [facility] REFERRING PROVIDER: Dr. Layman Piety    END OF SESSION:  OT End of Session - 12/06/23 1658     Visit Number 25    Number of Visits 44    Date for OT Re-Evaluation 02/05/24    Authorization Time Period Reporting period beginning 09/28/23-01/14/24    OT Start Time 1102    OT Stop Time 1148    OT Time Calculation (min) 46 min    Activity Tolerance Patient tolerated treatment well    Behavior During Therapy [facility] for tasks assessed/performed         Past Medical History:  Diagnosis Date   Abdominal pain, right upper quadrant    Back pain    Calculus of kidney 12/09/2013   Chronic back pain    unspecified   Chronic left shoulder pain 07/19/2015   Complication of anesthesia    Functional disorder of bladder    other   Galactorrhea 11/26/2014   Chronic    Hereditary and idiopathic neuropathy 08/19/2013   History of kidney stones    HPV test positive    Hypercholesteremia 08/19/2013   Hypertension    Incomplete bladder emptying    Microscopic hematuria    MS (multiple sclerosis) (HCC)    Muscle spasticity 05/21/2014   Nonspecific findings on examination of urine    other   Osteopenia    PONV (postoperative nausea and vomiting)    Status post laparoscopic supracervical hysterectomy 11/26/2014   Tobacco user 11/26/2014   Wrist fracture    Past Surgical History:  Procedure Laterality Date   bilateral tubal ligation  1996   BREAST CYST EXCISION Left 2002   CYST EXCISION Left 05/10/2022   Procedure: CYST REMOVAL;  Surgeon: Rodolph Romano, MD;  Location: [facility] ORS;  Service: General;  Laterality: Left;   FRACTURE SURGERY     KNEE SURGERY     right   LAPAROSCOPIC SUPRACERVICAL HYSTERECTOMY  08/05/2013   ORIF WRIST FRACTURE Left 01/17/2017    Procedure: OPEN REDUCTION INTERNAL FIXATION (ORIF) WRIST FRACTURE;  Surgeon: Leora Lynwood SAUNDERS, MD;  Location: [facility] ORS;  Service: Orthopedics;  Laterality: Left;   RADIOLOGY WITH ANESTHESIA N/A 03/18/2020   Procedure: MRI WITH ANESTHESIA CERVICAL SPINE AND BRAIN  WITH AND WITHOUT CONTRAST;  Surgeon: Radiologist, Medication, MD;  Location: [facility] OR;  Service: Radiology;  Laterality: N/A;   TUBAL LIGATION Bilateral    VAGINAL HYSTERECTOMY  03/2006   Patient Active Problem List   Diagnosis Date Noted   Sinus tachycardia 07/07/2023   Hypokalemia 07/29/2022   Weakness of both lower extremities 07/29/2022   Abscess of left groin 11/15/2021   Abnormal LFTs 11/15/2021   Acute respiratory disease due to COVID-19 virus 11/21/2020   Weakness    Hypoalbuminemia due to protein-calorie malnutrition [facility])    Neurogenic bowel    Neurogenic bladder    Labile blood pressure    Neuropathic pain    Abscess of female pelvis    SVT (supraventricular tachycardia) (HCC)    Radial styloid tenosynovitis 03/12/2018   Wheelchair dependence 02/27/2018   Localized osteoporosis with current pathological fracture with routine healing 01/19/2017   Wrist fracture 01/16/2017   Sprain of ankle 03/23/2016   Closed fracture of lateral malleolus 03/16/2016   Health care maintenance 01/24/2016   Blood pressure elevated without history of  HTN 10/25/2015   Essential hypertension 10/25/2015   Multiple sclerosis (HCC) 10/02/2015   Chronic left shoulder pain 07/19/2015   Multiple sclerosis exacerbation (HCC) 07/14/2015   MS (multiple sclerosis) (HCC) 11/26/2014   Increased body mass index 11/26/2014   HPV test positive 11/26/2014   Status post laparoscopic supracervical hysterectomy 11/26/2014   Galactorrhea 11/26/2014   Back ache 05/21/2014   Adiposity 05/21/2014   Disordered sleep 05/21/2014   Muscle spasticity 05/21/2014   Spasticity 05/21/2014   Calculus of kidney 12/09/2013   Renal colic 12/09/2013    Hypercholesteremia 08/19/2013   Hereditary and idiopathic neuropathy 08/19/2013   Hypercholesterolemia without hypertriglyceridemia 08/19/2013   Bladder infection, chronic 07/25/2012   Disorder of bladder function 07/25/2012   Incomplete bladder emptying 07/25/2012   Microscopic hematuria 07/25/2012   Right upper quadrant pain 07/25/2012   ONSET DATE: 5/24 Amon and broke R leg which caused beginning of ADL decline; diagnosed with MS in 1995)  REFERRING DIAG: MS  THERAPY DIAG:  Muscle weakness (generalized)  Other lack of coordination  Multiple sclerosis exacerbation (HCC)  Rationale for Evaluation and Treatment: Rehabilitation  SUBJECTIVE:  SUBJECTIVE STATEMENT: See below in patient treatment note for subjective Pt accompanied by: self  PERTINENT HISTORY: Pt reports increased difficulty with basic self care tasks since breaking her R leg last May.  Since that time, mobility and ADLs have declined, with pt requiring assist from private caregivers and family to manage bathing, toileting, dressing, and functional transfers.  PRECAUTIONS: Fall  WEIGHT BEARING RESTRICTIONS: No  PAIN: 11/27/23: No pain Are you having pain? No; occasional pain in R lower back, but not today  FALLS: Has patient fallen in last 6 months? No Last fall was May 17 of 2024  LIVING ENVIRONMENT: Lives with: lives with their family (including mother who has dementia and sister [name])  Lives in: 2 level home but pt resides on main level Stairs: ramp  Has following equipment at home: Wheelchair (manual), Shower bench, and hand held shower shower, 1 grab bar in the shower, 3in1 commode, sit to stand lift (electric), FWW, hospital bed  PLOF: modified indep with ADL/IADLs prior to May of 7975   PATIENT GOALS: Increase independence with basic self care tasks  OBJECTIVE:  Note: Objective measures were completed at Evaluation unless otherwise noted.  HAND DOMINANCE: Right  ADLs:  Overall ADLs: assist  provided from paid caregiver and sister Transfers/ambulation related to ADLs: sit to stand lift for all transfers, set up for sliding board in/out of bed  Eating: indep  Grooming: modified indep (wc level in mom's bathroom)  UB Dressing: set up (can't access her bedroom closet)  LB Dressing: able to sit on side of bed to don all LB clothing; assist to hike pants/underwear Toileting: assist with clothing management d/t standing within electric lift Bathing: bed bath with sister helping to wash backside Tub Shower transfers: N/A; pt has walk in shower in mom's bathroom (wc does fit in this bathroom but pt reports inability to transfer from wc and requires arm rests on a bench for a successful transfer attempt from any DME).  Tub bench is in pt's bathroom, but lift does not fit through bathroom doorway and pt reports inability to transfer up from tub bench (currently unable to manage either transfer)   Equipment: see above  IADLs: Shopping: [employer] for shopping Light housekeeping: modified indep from wc level Meal Prep: modified indep from wc level Community mobility: relies on community transportation or family members Medication management: indep  Financial management: indep Handwriting: NT; pt denies any [facility] challenges  MOBILITY STATUS: Hx of falls  POSTURE COMMENTS:  Rounded shoulders, forward head, anterior pelvic tilt, and weight shift left   ACTIVITY TOLERANCE: Activity tolerance: Per PT; currently standing 30-60 sec within Light Gait harness/lift; currently non-ambulatory  UPPER EXTREMITY ROM:  BUEs WFL  UPPER EXTREMITY MMT:     MMT Right eval Left eval  Shoulder flexion 4+ 4  Shoulder abduction 4+ 4  Shoulder adduction    Shoulder extension    Shoulder internal rotation 4+ 4+  Shoulder external rotation 4 4  Middle trapezius    Lower trapezius    Elbow flexion 4+ 4+  Elbow extension 4+ 4+  Wrist flexion 4+ 4+  Wrist extension 4+ 4+  Wrist ulnar deviation     Wrist radial deviation    Wrist pronation    Wrist supination    (Blank rows = not tested)  HAND FUNCTION/COORDINATION:  R/L WNL; pt denies any coordination deficits/ able to manipulate pills/clothing fasteners/etc without difficulty  11/15/23: *Measures taken d/t PT note revealing pt with increased difficulty manipulating smaller ADL supplies during reaching activities in PT session   11/15/23: Grip strength: R: 70 lbs, L: 63 lbs Pinch strength: Lateral: R: 16 lbs, L: 15 lbs; 3 point pinch: R: 12 lbs, L: 13 lbs 9 hole peg test: Right: 25 sec, L 49 sec   SENSATION: WFL  EDEMA: No visible edema in BUEs  MUSCLE TONE: BUEs WNL  COGNITION: Overall cognitive status: Within functional limits for tasks assessed  VISION: wears glasses all the time, no reports of diplopia  PERCEPTION: Not tested  PRAXIS: WFL  OBSERVATIONS:  Pt pleasant, cooperative, and motivated to work towards improving indep with ADLs.                                                                                                     TREATMENT DATE: 12/04/23  Patient reports she is really fatigued from physical therapy today and would like to focus on other tasks in sitting that do not require standing.  Therapeutic Activity: Patient seen this date with focus on reaching activity in combination with hand strength for resistive pinch patterns.  Patient able to complete all levels of pinch from yellow to black resistance placing onto dowel in a variety of planes of motion.  Encouraged weight shifting with reach.  Manipulation of small ball pegs to place into graded in an elevated plane of motion to encourage additional reach.  Patient responds well to cues  Neuromuscular reeducation: Patient seen for focus on manipulation skills for fine motor with use of small quarter inch pegs to pick up from tabletop, patient demonstrating some difficulty with manipulation of smaller objects, patient dropping items occasionally.   Patient also seen for manipulation of small to medium washers to pick up and place onto long dowel, cues for prehension patterns.    PATIENT EDUCATION: Education details: toilet transfer/clothing management strategies  Person educated: Patient Education method: Explanation, vc Education comprehension: verbalized understanding, demonstrated understanding, further training needed  HOME EXERCISE PROGRAM: IR  A/AAROM to promote shoulder flexibility for peri care/bathing  GOALS: Goals reviewed with patient? Yes  SHORT TERM GOALS: Target date: 12/25/23  Pt will perform bed bath with set up only. Baseline: Eval: Min-mod A for posterior washing; 09/28/23: Min A for posterior washing; 11/13/23: Able to manage bed bath but sister helps with thoroughness, per pt's preference; pt in agreement to trial bath sitting EOB or in wc before next OT session. Goal status: achieved/d/c  2.  Pt will utilize sliding board for wc<>drop arm commode transfer with SBA. Baseline: Eval: Currently using electric lift for transfer to [facility]; 09/28/23: Not yet completed at home but transfers to commode have been easier without sliding board d/t pt implementing a scoot pivot. Goal status: d/c (pt does not prefer sliding board for commode transfer)  3.  Pt will be indep to perform HEP for maintaining BUE strength for ADLs and functional transfers. Baseline: Eval: HEP not yet initiated; 09/28/23: Pt is working on core stability exercises from wc, cane stretches for bilat shoulder IR, and tricep extension via wc pushups; 11/13/23: indep Goal status: achieved  LONG TERM GOALS: Target date: 02/05/24  Pt will perform sink bath with set up A. (Revised on 11/13/23 from min A to set up) Baseline: Eval: Currently bed bathing d/t inability to stand at sink without electric lift (lift does not fit into pt's bathroom); 09/28/23: still completing bed bath as pt is not yet able to stand; 11/13/23: Not yet attempted; pt encouraged to attempt before  next OT session now that bed bath goal has been met Goal status: ongoing   2.  Pt will perform wc<>tub bench transfer with min A. Baseline: Eval: Unable; 09/28/23: Performed in OT clinic with min A, but not yet in the home Goal status: d/c (pt prefers sink bath)  3.  Pt will perform squat pivot transfer wc<>BSC with SBA.  Baseline: Eval: Eval: Currently using electric lift for transfer to [facility]; 09/28/23: Not yet completed in clinic with [facility], and using lift for [facility] in the home still; 11/13/23: Still using Camie lift at home and in clinic transfers are all scoot rather than squat pivots d/t LE weakness. Goal status: ongoing  4. Pt will perform scoot transfer wc<>toilet using grab bars with supv to allow toileting in community setting.  Baseline: Eval: Pt limits community outings d/t requiring use of lift for [facility] transfers; 09/28/23: Pt has performed 2 successful scoot pivot  Transfers to toilet in OT clinic with min A, extra time.  Further trials with clothing management on toilet needed, but pt can lower and hike  pants while sititng edge of mat via lateral leaning and min A to maintain core stability; 11/13/23: Pt performs with CGA and extra time.    Goal status: ongoing  5.  Pt will complete seated clothing management with min A to enable voiding in handicapped stall within community. Baseline: Recert 11/13/23: Pt can transfer to toilet with increased time and effort, but has not yet attempted clothing management or voiding while seated on commode in community setting.  Pt can lower pants to knees while seated in wc, but requires mod A to hike over hips from seated position.   6.  Pt will tolerate standing x1 min with close supv and BUE support to manage clothing in prep for toileting. Baseline: Eval: Currently standing in electric lift only, or within parallel bars without lift; 09/28/23: Not yet attempted; 11/13/23: Pt is able to perform static standing with heavy BUE support on parallel  bars with  PT Goal status: in progress  7.  Pt will increase bilat grip strength by (TBD) in order to securely grasp walker sufficiently for functional transfers and ambulation.  Baseline: Recert 11/13/23: TBD (PT note read following OT session, indicating pt with difficulty securing items in hand)  Goal status: New  8.  Pt will increase bilat [facility] skills in order to manipulate small ADL supplies with reduced dropping as indicated by (TBD) improvement in 9 hole peg test. Baseline: Recert 11/13/23: 9 hole peg test TBD (PT note read following OT session, indicating pt with difficulty manipulating and securing items in hands)  Goal status: New  ASSESSMENT: CLINICAL IMPRESSION: Patient with increased fatigue this date after PT session and requested to do task from seated position with focus on hand strength and coordination along with range of motion of upper extremities.  Patient engaging in functional reaching patterns with bilateral upper extremities in combination with resistive pinch.  Patient demonstrated some difficulty with manipulation of smaller objects around 1/4 inch in size and dropping items occasionally.  Therapist graded activities according to patient's needs to facilitate reaching and grasping patterns.  Pt will continue to benefit from skilled OT to work towards above noted goals in OT poc, working to maximize indep with daily tasks while reducing burden of care on caregivers.   PERFORMANCE DEFICITS: in functional skills including ADLs, IADLs, strength, pain, flexibility, Gross motor control, mobility, balance, body mechanics, endurance, and decreased knowledge of use of DME, and psychosocial skills including coping strategies, environmental adaptation, habits, and routines and behaviors.   IMPAIRMENTS: are limiting patient from ADLs, IADLs, and social participation.   CO-MORBIDITIES: has co-morbidities such as neuropathy, back pain, obesity, MS, HTN that affects occupational performance. Patient  will benefit from skilled OT to address above impairments and improve overall function.  MODIFICATION OR ASSISTANCE TO COMPLETE EVALUATION: No modification of tasks or assist necessary to complete an evaluation.  OT OCCUPATIONAL PROFILE AND HISTORY: Detailed assessment: Review of records and additional review of physical, cognitive, psychosocial history related to current functional performance.  CLINICAL DECISION MAKING: Moderate - several treatment options, min-mod task modification necessary  REHAB POTENTIAL: Good  EVALUATION COMPLEXITY: Moderate   PLAN:  OT FREQUENCY: 2x/week  OT DURATION: 12 weeks  PLANNED INTERVENTIONS: 97168 OT Re-evaluation, 97535 self care/ADL training, 02889 therapeutic exercise, 97530 therapeutic activity, 97112 neuromuscular re-education, 97140 manual therapy, 97116 gait training, 02989 moist heat, 97010 cryotherapy, balance training, functional mobility training, psychosocial skills training, energy conservation, coping strategies training, patient/family education, and DME and/or AE instructions  RECOMMENDED OTHER SERVICES: None at this time (Pt currently receiving PT services in this clinic)  CONSULTED AND AGREED WITH PLAN OF CARE: Patient  PLAN FOR NEXT SESSION: see above  Alessandria Henken T Ayriana Wix, OTR/L, CLT   Revella Shelton, OT 12/06/2023, 5:00 PM

## 2023-12-11 ENCOUNTER — Ambulatory Visit

## 2023-12-11 ENCOUNTER — Ambulatory Visit: Payer: PPO | Attending: Neurology

## 2023-12-11 DIAGNOSIS — R262 Difficulty in walking, not elsewhere classified: Secondary | ICD-10-CM | POA: Diagnosis not present

## 2023-12-11 DIAGNOSIS — R2681 Unsteadiness on feet: Secondary | ICD-10-CM | POA: Insufficient documentation

## 2023-12-11 DIAGNOSIS — G35 Multiple sclerosis: Secondary | ICD-10-CM | POA: Diagnosis not present

## 2023-12-11 DIAGNOSIS — R278 Other lack of coordination: Secondary | ICD-10-CM | POA: Insufficient documentation

## 2023-12-11 DIAGNOSIS — M6281 Muscle weakness (generalized): Secondary | ICD-10-CM | POA: Insufficient documentation

## 2023-12-11 DIAGNOSIS — R269 Unspecified abnormalities of gait and mobility: Secondary | ICD-10-CM | POA: Diagnosis not present

## 2023-12-11 DIAGNOSIS — R2689 Other abnormalities of gait and mobility: Secondary | ICD-10-CM | POA: Diagnosis not present

## 2023-12-11 NOTE — Therapy (Incomplete)
 OUTPATIENT PHYSICAL THERAPY NEURO TREATMENT   Patient Name: Lisa Williams MRN: [id number] DOB:12-10-1960, 63 y.o., female Today's Date: 12/11/2023   PCP: Lenon Na  REFERRING PROVIDER: Lenon Na  END OF SESSION:      Past Medical History:  Diagnosis Date   Abdominal pain, right upper quadrant    Back pain    Calculus of kidney 12/09/2013   Chronic back pain    unspecified   Chronic left shoulder pain 07/19/2015   Complication of anesthesia    Functional disorder of bladder    other   Galactorrhea 11/26/2014   Chronic    Hereditary and idiopathic neuropathy 08/19/2013   History of kidney stones    HPV test positive    Hypercholesteremia 08/19/2013   Hypertension    Incomplete bladder emptying    Microscopic hematuria    MS (multiple sclerosis) (HCC)    Muscle spasticity 05/21/2014   Nonspecific findings on examination of urine    other   Osteopenia    PONV (postoperative nausea and vomiting)    Status post laparoscopic supracervical hysterectomy 11/26/2014   Tobacco user 11/26/2014   Wrist fracture    Past Surgical History:  Procedure Laterality Date   bilateral tubal ligation  1996   BREAST CYST EXCISION Left 2002   CYST EXCISION Left 05/10/2022   Procedure: CYST REMOVAL;  Surgeon: Rodolph Romano, MD;  Location: [facility];  Service: General;  Laterality: Left;   FRACTURE SURGERY     KNEE SURGERY     right   LAPAROSCOPIC SUPRACERVICAL HYSTERECTOMY  08/05/2013   ORIF WRIST FRACTURE Left 01/17/2017   Procedure: OPEN REDUCTION INTERNAL FIXATION (ORIF) WRIST FRACTURE;  Surgeon: Leora Lynwood SAUNDERS, MD;  Location: [facility];  Service: Orthopedics;  Laterality: Left;   RADIOLOGY WITH ANESTHESIA N/A 03/18/2020   Procedure: MRI WITH ANESTHESIA CERVICAL SPINE AND BRAIN  WITH AND WITHOUT CONTRAST;  Surgeon: Radiologist, Medication, MD;  Location: [facility];  Service: Radiology;  Laterality: N/A;   TUBAL LIGATION Bilateral    VAGINAL  HYSTERECTOMY  03/2006   Patient Active Problem List   Diagnosis Date Noted   Sinus tachycardia 07/07/2023   Hypokalemia 07/29/2022   Weakness of both lower extremities 07/29/2022   Abscess of left groin 11/15/2021   Abnormal LFTs 11/15/2021   Acute respiratory disease due to COVID-19 virus 11/21/2020   Weakness    Hypoalbuminemia due to protein-calorie malnutrition [facility])    Neurogenic bowel    Neurogenic bladder    Labile blood pressure    Neuropathic pain    Abscess of female pelvis    SVT (supraventricular tachycardia) (HCC)    Radial styloid tenosynovitis 03/12/2018   Wheelchair dependence 02/27/2018   Localized osteoporosis with current pathological fracture with routine healing 01/19/2017   Wrist fracture 01/16/2017   Sprain of ankle 03/23/2016   Closed fracture of lateral malleolus 03/16/2016   Health care maintenance 01/24/2016   Blood pressure elevated without history of HTN 10/25/2015   Essential hypertension 10/25/2015   Multiple sclerosis (HCC) 10/02/2015   Chronic left shoulder pain 07/19/2015   Multiple sclerosis exacerbation (HCC) 07/14/2015   MS (multiple sclerosis) (HCC) 11/26/2014   Increased body mass index 11/26/2014   HPV test positive 11/26/2014   Status post laparoscopic supracervical hysterectomy 11/26/2014   Galactorrhea 11/26/2014   Back ache 05/21/2014   Adiposity 05/21/2014   Disordered sleep 05/21/2014   Muscle spasticity 05/21/2014   Spasticity 05/21/2014   Calculus of kidney 12/09/2013   Renal colic 12/09/2013  Hypercholesteremia 08/19/2013   Hereditary and idiopathic neuropathy 08/19/2013   Hypercholesterolemia without hypertriglyceridemia 08/19/2013   Bladder infection, chronic 07/25/2012   Disorder of bladder function 07/25/2012   Incomplete bladder emptying 07/25/2012   Microscopic hematuria 07/25/2012   Right upper quadrant pain 07/25/2012    ONSET DATE: 1995  REFERRING DIAG: MS  THERAPY DIAG:  No diagnosis  found.  Rationale for Evaluation and Treatment: Rehabilitation  SUBJECTIVE:                                                                                                                                                                                             SUBJECTIVE STATEMENT:  Pt reports nothing is new and her mom is continuing to do better.  Reports that she had a recent medication adjustment, and has noted increased Clonus of BLE since medication changes.   Pt accompanied by: self  PERTINENT HISTORY:  Patient is returning to PT s/p hospitalization.  s/p ORIF of R tibia shaft fracture 09/23/2022. Patient has weakness in BLE with RLE>LLE. She drives with hand controls. Patient has been diagnosed with MS in 1995. PMH includes: back pain, CBP, chronic L shoulder pain, galactorrhea, neuropathy, HPV, hypercholestermeia, HTN, MS, osteopenia, PONV, wrist fracture. Additional order for other closed fracture of proximal end of R tibia with routine healing. Still has to use [employer].   PAIN:  Are you having pain? Occasional pain in RLE; primarily in knee  PRECAUTIONS: Fall  RED FLAGS: None   WEIGHT BEARING RESTRICTIONS: No  FALLS: Has patient fallen in last 6 months? No  LIVING ENVIRONMENT: Lives with: lives with their family Lives in: House/apartment Stairs: ramp Has following equipment at home: Vannie - 2 wheeled, Wheelchair (manual), shower chair, and Grab bars  PLOF: Independent with household mobility with device  PATIENT GOALS: to get her independence back. To be able to get into car, toilet, and get dressed independently  OBJECTIVE:  Note: Objective measures were completed at Evaluation unless otherwise noted.  DIAGNOSTIC FINDINGS: MRI of the brain 03/18/2020 showed multiple T2/FLAIR hyperintense foci in the periventricular, juxtacortical and deep white matter.  There were no infratentorial lesions noted.  None of the foci enhanced.   Due to severe claustrophobia, the  study was done with conscious sedation in the hospital   COGNITION: Overall cognitive status: Within functional limits for tasks assessed   SENSATION: Lack of sensation in feet, loss in bilateral lateral aspect of knee  COORDINATION: Does not have the strength for functional LE heel slide test   MUSCLE TONE: BLE mild tone    POSTURE: rounded shoulders, forward head, anterior pelvic tilt,  and weight shift left   LOWER EXTREMITY MMT:    MMT Right Eval Left Eval  Hip flexion 0.9 1.5  Hip extension    Hip abduction 1.8 2.6  Hip adduction 3.1 1.9  Hip internal rotation    Hip external rotation    Knee flexion 0.9 1.5  Knee extension 0.8 1.2  Ankle dorsiflexion    Ankle plantarflexion    Ankle inversion    Ankle eversion    (Blank rows = not tested)  BED MOBILITY:  Assess in future session due to limited time  TRANSFERS: Assistive device utilized: Bariatric RW with sit to stands, slide board to table  Sit to stand: unable to reach full stand Stand to sit: unable to reach full stand  Chair to chair: slide board with CGA   GAIT: Unable to ambulate at this time.   FUNCTIONAL TESTS:  Sit to stand: tricep press with BUE; unable to bring arm to walker.    Function In Sitting Test (FIST)  (1/2 femur on surface; hips/knees flexed to 90deg)   - indicate bed or mat table / step stool if used  SCORING KEY: 4 = Independent (completes task independently & successfully) 3 = Verbal Cues/Increased Time (completes task independently & successfully and only needs more time/cues) 2 = Upper Extremity Support (must use UE for support or assistance to complete successfully) 1 = Needs Assistance (unable to complete w/o physical assist; DOCUMENT LEVEL: min, mod, max) 0 = Dependent (requires complete physical assist; unable to complete successfully even w/ physical assist)  Randomly Administer Once Throughout Exam  4 - Anterior Nudge (superior sternum)  4 - Posterior Nudge (between  scapular spines)  4 - Lateral Nudge (to dominant side at acromion)     4 - Static sitting (30 seconds)  4 - Sitting, shake 'no' (left and right)  4 - Sitting, eyes closed (30 seconds)   0 - Sitting, lift foot (dominant side, lift foot 1 inch twice)    2 - Pick up object from behind (object at midline, hands breadth posterior)  3 - Forward reach (use dominant arm, must complete full motion) 2 - Lateral reach (use dominant arm, clear opposite ischial tuberosity) 2 - Pick up object from floor (from between feet)   2 - Posterior scooting (move backwards 2 inches)  2 - Anterior scooting (move forward 2 inches)  2 - Lateral scooting (move to dominant side 2 inches)    TOTAL = 39/56  Notes/comments: slide board tranfer to/from table   MCD > 5 points MCID for IP REHAB > 6 points  Function In Sitting Test (FIST) 08/14/23 (1/2 femur on surface; hips/knees flexed to 90deg)   - indicate bed or mat table / step stool if used  SCORING KEY: 4 = Independent (completes task independently & successfully) 3 = Verbal Cues/Increased Time (completes task independently & successfully and only needs more time/cues) 2 = Upper Extremity Support (must use UE for support or assistance to complete successfully) 1 = Needs Assistance (unable to complete w/o physical assist; DOCUMENT LEVEL: min, mod, max) 0 = Dependent (requires complete physical assist; unable to complete successfully even w/ physical assist)  Randomly Administer Once Throughout Exam  4 - Anterior Nudge (superior sternum)  4 - Posterior Nudge (between scapular spines)  4 - Lateral Nudge (to dominant side at acromion)     4 - Static sitting (30 seconds)  4 - Sitting, shake 'no' (left and right)  4 - Sitting, eyes closed (30 seconds)  0 - Sitting, lift foot (dominant side, lift foot 1 inch twice)    4 - Pick up object from behind (object at midline, hands breadth posterior)  4 - Forward reach (use dominant arm, must complete full  motion) 4 - Lateral reach (use dominant arm, clear opposite ischial tuberosity)  - Pick up object from floor (from between feet)   3 - Posterior scooting (move backwards 2 inches)  3 - Anterior scooting (move forward 2 inches)  3 - Lateral scooting (move to dominant side 2 inches)    TOTAL = 47/56  MCD > 5 points MCID for IP REHAB > 6 points                                                                                                                              TREATMENT DATE: 12/11/23   TherAct:  In // bars with gait belt and CGA: STS attempts x4 total -  with bilateral UE support on // bars- pt's R leg trembling on initial stand for each bout but improved with weight shifting  Standing weight shifting laterally x multiple reps on 2 bouts of standing between attempts to take steps  Ambulation in // bars, 2 -4 steps taken at each attempt, x3 attempts.  Lateral scoot to mat table:   Function In Sitting Test (FIST) 08/14/23 (1/2 femur on surface; hips/knees flexed to 90deg)   - indicate bed or mat table / step stool if used  SCORING KEY: 4 = Independent (completes task independently & successfully) 3 = Verbal Cues/Increased Time (completes task independently & successfully and only needs more time/cues) 2 = Upper Extremity Support (must use UE for support or assistance to complete successfully) 1 = Needs Assistance (unable to complete w/o physical assist; DOCUMENT LEVEL: min, mod, max) 0 = Dependent (requires complete physical assist; unable to complete successfully even w/ physical assist)  Randomly Administer Once Throughout Exam  4 - Anterior Nudge (superior sternum)  4 - Posterior Nudge (between scapular spines)  4 - Lateral Nudge (to dominant side at acromion)     4 - Static sitting (30 seconds)  4 - Sitting, shake 'no' (left and right)  4 - Sitting, eyes closed (30 seconds)   1 - Sitting, lift foot (dominant side, lift foot 1 inch twice)    4 - Pick up object  from behind (object at midline, hands breadth posterior)  4 - Forward reach (use dominant arm, must complete full motion) 4 - Lateral reach (use dominant arm, clear opposite ischial tuberosity) 3 - Pick up object from floor (from between feet)   2 - Posterior scooting (move backwards 2 inches)  2 - Anterior scooting (move forward 2 inches)  2 - Lateral scooting (move to dominant side 2 inches)    TOTAL = 46/56  MCD > 5 points MCID for IP REHAB > 6 points   Seated cross body reaches x 15 bil.  Forward BUE reaches x  12 with varied distance bil.   Lateral scooting to WC with CGA for safety to reduce risk of anterior LOB   Sit<>stand from WC x 1 after 3 attempts. Increased clonus on first 2 attempts limits ability to attain standing. Mod assist from PT and +2 for RW stabilization.     Thorough rest breaks provided during session to prevent fatigue       PATIENT EDUCATION: Education details: Pt educated throughout session about proper posture and technique with exercises. Improved exercise technique, movement at target joints, use of target muscles after min to mod verbal, visual, tactile cues.  Person educated: Patient Education method: Explanation, Demonstration, Tactile cues, and Verbal cues Education comprehension: verbalized understanding, returned demonstration, verbal cues required, tactile cues required, and needs further education  HOME EXERCISE PROGRAM: Stand 3x/day in stander   GOALS: Goals reviewed with patient? Yes  SHORT TERM GOALS: Target date: 08/08/2023  Patient will be independent in home exercise program to improve strength/mobility for better functional independence with ADLs.  Baseline: 4/8: compliance Goal status: MET    LONG TERM GOALS: Target date: 12/27/2023  Patient will tolerate five consecutive stands with UE support from wheelchair to standing to improve functional mobility with additional cushion and RW.  Baseline: unable to perform 4/8:unable  to perform full stand 5/20: perform next session 5/29: two full stands with CGA and cushion on her seat 7/31: able to stand 3 times from [facility]. With no addition cushion  in parallel bars.  Goal status: Ongoing   2.  Patient will ambulate 10 ft with bRW with wheelchair follow.  Baseline:  unable to ambulate 4/8: unable to ambulate 5/20: able to ambulate length of // bars in previous session 5/27: ambulate 2.5 lengths of // bars with two seated rest breaks with min A for weight shift and wheelchair follow  7/31: able to ambulate in parallel bars 3 ft with min assist and +2 follow in WC> difficulty advancing the LLE on this day.  Goal status: Ongoing   3.  Patient will improve FIST score >6 points to demonstrate improved stability and ability to perform ADLs.  Baseline:  3/6: 39/56 4/8: 47/ 56 5/20: 48/56 7/31:46/56 Goal status: MET  4.  Patient will perform toileting with mod I at home for improved independence.  Baseline: 3/6: requires assistance 4/8: requires dependence on machine 5/20: requires assistance with use of wipe buddy.  7/31: continues to require use of sara steady and wipe buddy for safety and hygiene Goal status: ongoing     ASSESSMENT:  CLINICAL IMPRESSION:  Pt continues to perform well and was able to progress to walking again today. Schedule changes limit full time for PT treatment on this day. PT instructed pt in progress note assessment to measure progress towards LTGs. Able to demonstrate increased sit<>stand in parallel bars for 3 repetitions and gait for at least 3 ft.  Pt noted to continue to have trembling in the BLE when standing on each attempt, Which is worse than prior session. Patient's condition has the potential to improve in response to therapy. Maximum improvement is yet to be obtained. The anticipated improvement is attainable and reasonable in a generally predictable time.  Pt will continue to benefit from skilled therapy to address remaining deficits in order  to improve overall QoL and return to PLOF.       OBJECTIVE IMPAIRMENTS: Abnormal gait, cardiopulmonary status limiting activity, decreased activity tolerance, decreased balance, decreased coordination, decreased endurance, decreased mobility, difficulty walking, decreased ROM, decreased  strength, hypomobility, increased fascial restrictions, impaired perceived functional ability, impaired flexibility, impaired sensation, improper body mechanics, and postural dysfunction.   ACTIVITY LIMITATIONS: carrying, lifting, bending, sitting, standing, squatting, sleeping, stairs, transfers, bed mobility, continence, bathing, toileting, dressing, self feeding, reach over head, hygiene/grooming, locomotion level, and caring for others  PARTICIPATION LIMITATIONS: meal prep, cleaning, laundry, interpersonal relationship, driving, shopping, community activity, and church  PERSONAL FACTORS: Age, Past/current experiences, Time since onset of injury/illness/exacerbation, Transportation, and 3+ comorbidities: ack pain, CBP, chronic L shoulder pain, galactorrhea, neuropathy, HPV, hypercholestermeia, HTN, MS, osteopenia, PONV, wrist fracture are also affecting patient's functional outcome.   REHAB POTENTIAL: Good  CLINICAL DECISION MAKING: Evolving/moderate complexity  EVALUATION COMPLEXITY: Moderate  PLAN:  PT FREQUENCY: 2x/week  PT DURATION: 12 weeks  PLANNED INTERVENTIONS: 97164- PT Re-evaluation, 97110-Therapeutic exercises, 97530- Therapeutic activity, 97112- Neuromuscular re-education, 97535- Self Care, [id number]- Manual therapy, [phone]- Gait training, [phone]- Orthotic Fit/training, [phone]- Canalith repositioning, [id number]- Aquatic Therapy, [phone]- Electrical stimulation (unattended), [phone]- Electrical stimulation (manual), [id number]- Ultrasound, [id number]- Traction (mechanical), Patient/Family education, Balance training, Stair training, Taping, Dry Needling, Joint mobilization, Joint manipulation, Spinal manipulation,  Spinal mobilization, Scar mobilization, Compression bandaging, Vestibular training, Visual/preceptual remediation/compensation, Cognitive remediation, DME instructions, Cryotherapy, Moist heat, and Biofeedback  PLAN FOR NEXT SESSION:   sit to stands; try supported walking, continue dynamic core stabilization training and seated reaching tasks to improve weight shifting.    Chyrl London, PT  Physical Therapist - [facility]  11:32 AM 12/11/23

## 2023-12-12 ENCOUNTER — Encounter: Payer: Self-pay | Admitting: Neurology

## 2023-12-12 NOTE — Therapy (Signed)
 OUTPATIENT PHYSICAL THERAPY NEURO TREATMENT   Patient Name: Lisa Williams MRN: [id number] DOB:12/05/1960, 63 y.o., female Today's Date: 12/12/2023   PCP: Lenon Na  REFERRING PROVIDER: Lenon Na  END OF SESSION:  PT End of Session - 12/12/23 0906     Visit Number 41    Number of Visits 47    Date for PT Re-Evaluation 12/27/23   corrected   Progress Note Due on Visit 50    PT Start Time 1145    PT Stop Time 1230    PT Time Calculation (min) 45 min    Equipment Utilized During Treatment Gait belt    Activity Tolerance Patient tolerated treatment well;No increased pain    Behavior During Therapy [facility] for tasks assessed/performed             Past Medical History:  Diagnosis Date   Abdominal pain, right upper quadrant    Back pain    Calculus of kidney 12/09/2013   Chronic back pain    unspecified   Chronic left shoulder pain 07/19/2015   Complication of anesthesia    Functional disorder of bladder    other   Galactorrhea 11/26/2014   Chronic    Hereditary and idiopathic neuropathy 08/19/2013   History of kidney stones    HPV test positive    Hypercholesteremia 08/19/2013   Hypertension    Incomplete bladder emptying    Microscopic hematuria    MS (multiple sclerosis) (HCC)    Muscle spasticity 05/21/2014   Nonspecific findings on examination of urine    other   Osteopenia    PONV (postoperative nausea and vomiting)    Status post laparoscopic supracervical hysterectomy 11/26/2014   Tobacco user 11/26/2014   Wrist fracture    Past Surgical History:  Procedure Laterality Date   bilateral tubal ligation  1996   BREAST CYST EXCISION Left 2002   CYST EXCISION Left 05/10/2022   Procedure: CYST REMOVAL;  Surgeon: Rodolph Romano, MD;  Location: [facility] ORS;  Service: General;  Laterality: Left;   FRACTURE SURGERY     KNEE SURGERY     right   LAPAROSCOPIC SUPRACERVICAL HYSTERECTOMY  08/05/2013   ORIF WRIST FRACTURE Left  01/17/2017   Procedure: OPEN REDUCTION INTERNAL FIXATION (ORIF) WRIST FRACTURE;  Surgeon: Leora Lynwood SAUNDERS, MD;  Location: [facility] OR[facility];  Service: Orthopedics;  Laterality: Left;   RADIOLOGY WITH ANESTHESIA N/A 03/18/2020   Procedure: MRI WITH ANESTHESIA CERVICAL SPINE AND BRAIN  WITH AND WITHOUT CONTRAST;  Surgeon: Radiologist, Medication, MD;  Location: [facility] OR;  Service: Radiology;  Laterality: N/A;   TUBAL LIGATION Bilateral    VAGINAL HYSTERECTOMY  03/2006   Patient Active Problem List   Diagnosis Date Noted   Sinus tachycardia 07/07/2023   Hypokalemia 07/29/2022   Weakness of both lower extremities 07/29/2022   Abscess of left groin 11/15/2021   Abnormal LFTs 11/15/2021   Acute respiratory disease due to COVID-19 virus 11/21/2020   Weakness    Hypoalbuminemia due to protein-calorie malnutrition [facility])    Neurogenic bowel    Neurogenic bladder    Labile blood pressure    Neuropathic pain    Abscess of female pelvis    SVT (supraventricular tachycardia) (HCC)    Radial styloid tenosynovitis 03/12/2018   Wheelchair dependence 02/27/2018   Localized osteoporosis with current pathological fracture with routine healing 01/19/2017   Wrist fracture 01/16/2017   Sprain of ankle 03/23/2016   Closed fracture of lateral malleolus 03/16/2016   Health care maintenance 01/24/2016  Blood pressure elevated without history of HTN 10/25/2015   Essential hypertension 10/25/2015   Multiple sclerosis (HCC) 10/02/2015   Chronic left shoulder pain 07/19/2015   Multiple sclerosis exacerbation (HCC) 07/14/2015   MS (multiple sclerosis) (HCC) 11/26/2014   Increased body mass index 11/26/2014   HPV test positive 11/26/2014   Status post laparoscopic supracervical hysterectomy 11/26/2014   Galactorrhea 11/26/2014   Back ache 05/21/2014   Adiposity 05/21/2014   Disordered sleep 05/21/2014   Muscle spasticity 05/21/2014   Spasticity 05/21/2014   Calculus of kidney 12/09/2013   Renal colic 12/09/2013    Hypercholesteremia 08/19/2013   Hereditary and idiopathic neuropathy 08/19/2013   Hypercholesterolemia without hypertriglyceridemia 08/19/2013   Bladder infection, chronic 07/25/2012   Disorder of bladder function 07/25/2012   Incomplete bladder emptying 07/25/2012   Microscopic hematuria 07/25/2012   Right upper quadrant pain 07/25/2012    ONSET DATE: 1995  REFERRING DIAG: MS  THERAPY DIAG:  Muscle weakness (generalized)  Other lack of coordination  Multiple sclerosis exacerbation (HCC)  Difficulty in walking, not elsewhere classified  Unsteadiness on feet  Abnormality of gait and mobility  Other abnormalities of gait and mobility  Rationale for Evaluation and Treatment: Rehabilitation  SUBJECTIVE:                                                                                                                                                                                             SUBJECTIVE STATEMENT:  Pt reports feeling okay today- states worried about the increased incidence of Clonus of BLE since medication changes. Going to discuss with MD at next appointment.   Pt accompanied by: self  PERTINENT HISTORY:  Patient is returning to PT s/p hospitalization.  s/p ORIF of R tibia shaft fracture 09/23/2022. Patient has weakness in BLE with RLE>LLE. She drives with hand controls. Patient has been diagnosed with MS in 1995. PMH includes: back pain, CBP, chronic L shoulder pain, galactorrhea, neuropathy, HPV, hypercholestermeia, HTN, MS, osteopenia, PONV, wrist fracture. Additional order for other closed fracture of proximal end of R tibia with routine healing. Still has to use [employer].   PAIN:  Are you having pain? Occasional pain in RLE; primarily in knee  PRECAUTIONS: Fall  RED FLAGS: None   WEIGHT BEARING RESTRICTIONS: No  FALLS: Has patient fallen in last 6 months? No  LIVING ENVIRONMENT: Lives with: lives with their family Lives in: House/apartment Stairs:  ramp Has following equipment at home: Vannie - 2 wheeled, Wheelchair (manual), shower chair, and Grab bars  PLOF: Independent with household mobility with device  PATIENT GOALS: to get her independence back. To be able to  get into car, toilet, and get dressed independently  OBJECTIVE:  Note: Objective measures were completed at Evaluation unless otherwise noted.  DIAGNOSTIC FINDINGS: MRI of the brain 03/18/2020 showed multiple T2/FLAIR hyperintense foci in the periventricular, juxtacortical and deep white matter.  There were no infratentorial lesions noted.  None of the foci enhanced.   Due to severe claustrophobia, the study was done with conscious sedation in the hospital   COGNITION: Overall cognitive status: Within functional limits for tasks assessed   SENSATION: Lack of sensation in feet, loss in bilateral lateral aspect of knee  COORDINATION: Does not have the strength for functional LE heel slide test   MUSCLE TONE: BLE mild tone    POSTURE: rounded shoulders, forward head, anterior pelvic tilt, and weight shift left   LOWER EXTREMITY MMT:    MMT Right Eval Left Eval  Hip flexion 0.9 1.5  Hip extension    Hip abduction 1.8 2.6  Hip adduction 3.1 1.9  Hip internal rotation    Hip external rotation    Knee flexion 0.9 1.5  Knee extension 0.8 1.2  Ankle dorsiflexion    Ankle plantarflexion    Ankle inversion    Ankle eversion    (Blank rows = not tested)  BED MOBILITY:  Assess in future session due to limited time  TRANSFERS: Assistive device utilized: Bariatric RW with sit to stands, slide board to table  Sit to stand: unable to reach full stand Stand to sit: unable to reach full stand  Chair to chair: slide board with CGA   GAIT: Unable to ambulate at this time.   FUNCTIONAL TESTS:  Sit to stand: tricep press with BUE; unable to bring arm to walker.    Function In Sitting Test (FIST)  (1/2 femur on surface; hips/knees flexed to 90deg)   -  indicate bed or mat table / step stool if used  SCORING KEY: 4 = Independent (completes task independently & successfully) 3 = Verbal Cues/Increased Time (completes task independently & successfully and only needs more time/cues) 2 = Upper Extremity Support (must use UE for support or assistance to complete successfully) 1 = Needs Assistance (unable to complete w/o physical assist; DOCUMENT LEVEL: min, mod, max) 0 = Dependent (requires complete physical assist; unable to complete successfully even w/ physical assist)  Randomly Administer Once Throughout Exam  4 - Anterior Nudge (superior sternum)  4 - Posterior Nudge (between scapular spines)  4 - Lateral Nudge (to dominant side at acromion)     4 - Static sitting (30 seconds)  4 - Sitting, shake 'no' (left and right)  4 - Sitting, eyes closed (30 seconds)   0 - Sitting, lift foot (dominant side, lift foot 1 inch twice)    2 - Pick up object from behind (object at midline, hands breadth posterior)  3 - Forward reach (use dominant arm, must complete full motion) 2 - Lateral reach (use dominant arm, clear opposite ischial tuberosity) 2 - Pick up object from floor (from between feet)   2 - Posterior scooting (move backwards 2 inches)  2 - Anterior scooting (move forward 2 inches)  2 - Lateral scooting (move to dominant side 2 inches)    TOTAL = 39/56  Notes/comments: slide board tranfer to/from table   MCD > 5 points MCID for IP REHAB > 6 points  Function In Sitting Test (FIST) 08/14/23 (1/2 femur on surface; hips/knees flexed to 90deg)   - indicate bed or mat table / step stool if used  SCORING KEY: 4 = Independent (completes task independently & successfully) 3 = Verbal Cues/Increased Time (completes task independently & successfully and only needs more time/cues) 2 = Upper Extremity Support (must use UE for support or assistance to complete successfully) 1 = Needs Assistance (unable to complete w/o physical assist; DOCUMENT  LEVEL: min, mod, max) 0 = Dependent (requires complete physical assist; unable to complete successfully even w/ physical assist)  Randomly Administer Once Throughout Exam  4 - Anterior Nudge (superior sternum)  4 - Posterior Nudge (between scapular spines)  4 - Lateral Nudge (to dominant side at acromion)     4 - Static sitting (30 seconds)  4 - Sitting, shake 'no' (left and right)  4 - Sitting, eyes closed (30 seconds)   0 - Sitting, lift foot (dominant side, lift foot 1 inch twice)    4 - Pick up object from behind (object at midline, hands breadth posterior)  4 - Forward reach (use dominant arm, must complete full motion) 4 - Lateral reach (use dominant arm, clear opposite ischial tuberosity)  - Pick up object from floor (from between feet)   3 - Posterior scooting (move backwards 2 inches)  3 - Anterior scooting (move forward 2 inches)  3 - Lateral scooting (move to dominant side 2 inches)    TOTAL = 47/56  MCD > 5 points MCID for IP REHAB > 6 points                                                                                                                              TREATMENT DATE: 12/11/23   TherAct:  In // bars with gait belt and min A:  STS attempts x 6 total -  with bilateral UE support on // bars- pt exhibited some bilateral Leg trembling on each stand and poor eccentric control with sitting  Static stand with erect posture- VC for gluteal contraction and to look ahead x 1 min- x 3 trials- All with only CGA once in standing position.   Standing with lateral weight shift x multiple reps x 3 rounds  Attempted ambulation in // bars, patient only able to advance RLE yet unable to advance Left LE despite multiple attempts.   NMR: Seated unilateral scap row- 12.5 # 2 x 10 reps   Therex:  Seated tricep press down- 7.5# 2 x 10 reps ea UE- VC for correct technique.    Thorough rest breaks provided during session to prevent fatigue       PATIENT  EDUCATION: Education details: Pt educated throughout session about proper posture and technique with exercises. Improved exercise technique, movement at target joints, use of target muscles after min to mod verbal, visual, tactile cues.  Person educated: Patient Education method: Explanation, Demonstration, Tactile cues, and Verbal cues Education comprehension: verbalized understanding, returned demonstration, verbal cues required, tactile cues required, and needs further education  HOME EXERCISE PROGRAM: Stand 3x/day in stander   GOALS: Goals reviewed  with patient? Yes  SHORT TERM GOALS: Target date: 08/08/2023  Patient will be independent in home exercise program to improve strength/mobility for better functional independence with ADLs.  Baseline: 4/8: compliance Goal status: MET    LONG TERM GOALS: Target date: 12/27/2023  Patient will tolerate five consecutive stands with UE support from wheelchair to standing to improve functional mobility with additional cushion and RW.  Baseline: unable to perform 4/8:unable to perform full stand 5/20: perform next session 5/29: two full stands with CGA and cushion on her seat 7/31: able to stand 3 times from [facility]. With no addition cushion  in parallel bars.  Goal status: Ongoing   2.  Patient will ambulate 10 ft with bRW with wheelchair follow.  Baseline:  unable to ambulate 4/8: unable to ambulate 5/20: able to ambulate length of // bars in previous session 5/27: ambulate 2.5 lengths of // bars with two seated rest breaks with min A for weight shift and wheelchair follow  7/31: able to ambulate in parallel bars 3 ft with min assist and +2 follow in WC> difficulty advancing the LLE on this day.  Goal status: Ongoing   3.  Patient will improve FIST score >6 points to demonstrate improved stability and ability to perform ADLs.  Baseline:  3/6: 39/56 4/8: 47/ 56 5/20: 48/56 7/31:46/56 Goal status: MET  4.  Patient will perform toileting with mod I  at home for improved independence.  Baseline: 3/6: requires assistance 4/8: requires dependence on machine 5/20: requires assistance with use of wipe buddy.  7/31: continues to require use of sara steady and wipe buddy for safety and hygiene Goal status: ongoing     ASSESSMENT:  CLINICAL IMPRESSION:  Patient arrived with good motivation and able to progress with more sit to stands in terms of reps and later able to perform some static stand with increased time followed by some lateral weight shifting. She was unable to take any functional steps in // bars despite multiple efforts. She did demonstrate good postural awareness with scap rows and good return demo with tricep press down. Discussed her ongoing clonus and patient to follow up with MD.  Pt will continue to benefit from skilled therapy to address remaining deficits in order to improve overall QoL and return to PLOF.       OBJECTIVE IMPAIRMENTS: Abnormal gait, cardiopulmonary status limiting activity, decreased activity tolerance, decreased balance, decreased coordination, decreased endurance, decreased mobility, difficulty walking, decreased ROM, decreased strength, hypomobility, increased fascial restrictions, impaired perceived functional ability, impaired flexibility, impaired sensation, improper body mechanics, and postural dysfunction.   ACTIVITY LIMITATIONS: carrying, lifting, bending, sitting, standing, squatting, sleeping, stairs, transfers, bed mobility, continence, bathing, toileting, dressing, self feeding, reach over head, hygiene/grooming, locomotion level, and caring for others  PARTICIPATION LIMITATIONS: meal prep, cleaning, laundry, interpersonal relationship, driving, shopping, community activity, and church  PERSONAL FACTORS: Age, Past/current experiences, Time since onset of injury/illness/exacerbation, Transportation, and 3+ comorbidities: ack pain, CBP, chronic L shoulder pain, galactorrhea, neuropathy, HPV,  hypercholestermeia, HTN, MS, osteopenia, PONV, wrist fracture are also affecting patient's functional outcome.   REHAB POTENTIAL: Good  CLINICAL DECISION MAKING: Evolving/moderate complexity  EVALUATION COMPLEXITY: Moderate  PLAN:  PT FREQUENCY: 2x/week  PT DURATION: 12 weeks  PLANNED INTERVENTIONS: 97164- PT Re-evaluation, 97110-Therapeutic exercises, 97530- Therapeutic activity, [id number]- Neuromuscular re-education, 97535- Self Care, [id number]- Manual therapy, [id number]- Gait training, [phone]- Orthotic Fit/training, [phone]- Canalith repositioning, [id number]- Aquatic Therapy, [id number]- Electrical stimulation (unattended), [id number]- Electrical stimulation (manual), [id number]- Ultrasound, [id number]- Traction (mechanical), Patient/Family education,  Balance training, Stair training, Taping, Dry Needling, Joint mobilization, Joint manipulation, Spinal manipulation, Spinal mobilization, Scar mobilization, Compression bandaging, Vestibular training, Visual/preceptual remediation/compensation, Cognitive remediation, DME instructions, Cryotherapy, Moist heat, and Biofeedback  PLAN FOR NEXT SESSION:   sit to stands; try supported walking, continue dynamic core stabilization training and seated reaching tasks to improve weight shifting.    Chyrl London, PT  Physical Therapist -   [facility]  12:22 PM 12/12/23

## 2023-12-13 ENCOUNTER — Ambulatory Visit: Payer: PPO

## 2023-12-13 ENCOUNTER — Ambulatory Visit

## 2023-12-13 DIAGNOSIS — R262 Difficulty in walking, not elsewhere classified: Secondary | ICD-10-CM

## 2023-12-13 DIAGNOSIS — R278 Other lack of coordination: Secondary | ICD-10-CM

## 2023-12-13 DIAGNOSIS — M6281 Muscle weakness (generalized): Secondary | ICD-10-CM | POA: Diagnosis not present

## 2023-12-13 DIAGNOSIS — R2689 Other abnormalities of gait and mobility: Secondary | ICD-10-CM

## 2023-12-13 DIAGNOSIS — G35 Multiple sclerosis: Secondary | ICD-10-CM

## 2023-12-13 DIAGNOSIS — R2681 Unsteadiness on feet: Secondary | ICD-10-CM

## 2023-12-13 DIAGNOSIS — R269 Unspecified abnormalities of gait and mobility: Secondary | ICD-10-CM

## 2023-12-13 NOTE — Therapy (Signed)
 OUTPATIENT OCCUPATIONAL THERAPY NEURO TREATMENT NOTE  Patient Name: Lisa Williams MRN: [id number] DOB:1960/07/13, 63 y.o., female Today's Date: 12/13/2023  PCP: Dr. Layman Piety   Neurologist Dr. Suanne at [facility] REFERRING PROVIDER: Dr. Layman Piety    END OF SESSION:  OT End of Session - 12/13/23 0956     Visit Number 27    Number of Visits 44    Date for OT Re-Evaluation 02/05/24    Authorization Time Period Reporting period beginning 09/28/23-01/14/24    Progress Note Due on Visit 30    OT Start Time 1100    OT Stop Time 1145    OT Time Calculation (min) 45 min    Equipment Utilized During Treatment manual wc    Activity Tolerance Patient tolerated treatment well    Behavior During Therapy [facility] for tasks assessed/performed         Past Medical History:  Diagnosis Date   Abdominal pain, right upper quadrant    Back pain    Calculus of kidney 12/09/2013   Chronic back pain    unspecified   Chronic left shoulder pain 07/19/2015   Complication of anesthesia    Functional disorder of bladder    other   Galactorrhea 11/26/2014   Chronic    Hereditary and idiopathic neuropathy 08/19/2013   History of kidney stones    HPV test positive    Hypercholesteremia 08/19/2013   Hypertension    Incomplete bladder emptying    Microscopic hematuria    MS (multiple sclerosis) (HCC)    Muscle spasticity 05/21/2014   Nonspecific findings on examination of urine    other   Osteopenia    PONV (postoperative nausea and vomiting)    Status post laparoscopic supracervical hysterectomy 11/26/2014   Tobacco user 11/26/2014   Wrist fracture    Past Surgical History:  Procedure Laterality Date   bilateral tubal ligation  1996   BREAST CYST EXCISION Left 2002   CYST EXCISION Left 05/10/2022   Procedure: CYST REMOVAL;  Surgeon: Rodolph Romano, MD;  Location: [facility] ORS;  Service: General;  Laterality: Left;   FRACTURE SURGERY     KNEE SURGERY     right    LAPAROSCOPIC SUPRACERVICAL HYSTERECTOMY  08/05/2013   ORIF WRIST FRACTURE Left 01/17/2017   Procedure: OPEN REDUCTION INTERNAL FIXATION (ORIF) WRIST FRACTURE;  Surgeon: Leora Lynwood SAUNDERS, MD;  Location: [facility] ORS;  Service: Orthopedics;  Laterality: Left;   RADIOLOGY WITH ANESTHESIA N/A 03/18/2020   Procedure: MRI WITH ANESTHESIA CERVICAL SPINE AND BRAIN  WITH AND WITHOUT CONTRAST;  Surgeon: Radiologist, Medication, MD;  Location: [facility] OR;  Service: Radiology;  Laterality: N/A;   TUBAL LIGATION Bilateral    VAGINAL HYSTERECTOMY  03/2006   Patient Active Problem List   Diagnosis Date Noted   Sinus tachycardia 07/07/2023   Hypokalemia 07/29/2022   Weakness of both lower extremities 07/29/2022   Abscess of left groin 11/15/2021   Abnormal LFTs 11/15/2021   Acute respiratory disease due to COVID-19 virus 11/21/2020   Weakness    Hypoalbuminemia due to protein-calorie malnutrition [facility])    Neurogenic bowel    Neurogenic bladder    Labile blood pressure    Neuropathic pain    Abscess of female pelvis    SVT (supraventricular tachycardia) (HCC)    Radial styloid tenosynovitis 03/12/2018   Wheelchair dependence 02/27/2018   Localized osteoporosis with current pathological fracture with routine healing 01/19/2017   Wrist fracture 01/16/2017   Sprain of ankle 03/23/2016   Closed fracture  of lateral malleolus 03/16/2016   Health care maintenance 01/24/2016   Blood pressure elevated without history of HTN 10/25/2015   Essential hypertension 10/25/2015   Multiple sclerosis (HCC) 10/02/2015   Chronic left shoulder pain 07/19/2015   Multiple sclerosis exacerbation (HCC) 07/14/2015   MS (multiple sclerosis) (HCC) 11/26/2014   Increased body mass index 11/26/2014   HPV test positive 11/26/2014   Status post laparoscopic supracervical hysterectomy 11/26/2014   Galactorrhea 11/26/2014   Back ache 05/21/2014   Adiposity 05/21/2014   Disordered sleep 05/21/2014   Muscle spasticity 05/21/2014    Spasticity 05/21/2014   Calculus of kidney 12/09/2013   Renal colic 12/09/2013   Hypercholesteremia 08/19/2013   Hereditary and idiopathic neuropathy 08/19/2013   Hypercholesterolemia without hypertriglyceridemia 08/19/2013   Bladder infection, chronic 07/25/2012   Disorder of bladder function 07/25/2012   Incomplete bladder emptying 07/25/2012   Microscopic hematuria 07/25/2012   Right upper quadrant pain 07/25/2012   ONSET DATE: 5/24 Amon and broke R leg which caused beginning of ADL decline; diagnosed with MS in 1995)  REFERRING DIAG: MS  THERAPY DIAG:  Muscle weakness (generalized)  Other lack of coordination  Multiple sclerosis exacerbation (HCC)  Rationale for Evaluation and Treatment: Rehabilitation  SUBJECTIVE:  SUBJECTIVE STATEMENT: Pt reports experiencing some new clonus in her BLEs, and is questioning if this may be a symptom from her new meds.  Pt requested to avoid transfers today d/t clonus and pt will have follow up with MD later this week. Pt accompanied by: self  PERTINENT HISTORY: Pt reports increased difficulty with basic self care tasks since breaking her R leg last May.  Since that time, mobility and ADLs have declined, with pt requiring assist from private caregivers and family to manage bathing, toileting, dressing, and functional transfers.  PRECAUTIONS: Fall  WEIGHT BEARING RESTRICTIONS: No  PAIN: 11/27/23: No pain Are you having pain? No; occasional pain in R lower back, but not today  FALLS: Has patient fallen in last 6 months? No Last fall was May 17 of 2024  LIVING ENVIRONMENT: Lives with: lives with their family (including mother who has dementia and sister [name])  Lives in: 2 level home but pt resides on main level Stairs: ramp  Has following equipment at home: Wheelchair (manual), Shower bench, and hand held shower shower, 1 grab bar in the shower, 3in1 commode, sit to stand lift (electric), FWW, hospital bed  PLOF: modified indep with  ADL/IADLs prior to May of 7975   PATIENT GOALS: Increase independence with basic self care tasks  OBJECTIVE:  Note: Objective measures were completed at Evaluation unless otherwise noted.  HAND DOMINANCE: Right  ADLs:  Overall ADLs: assist provided from paid caregiver and sister Transfers/ambulation related to ADLs: sit to stand lift for all transfers, set up for sliding board in/out of bed  Eating: indep  Grooming: modified indep (wc level in mom's bathroom)  UB Dressing: set up (can't access her bedroom closet)  LB Dressing: able to sit on side of bed to don all LB clothing; assist to hike pants/underwear Toileting: assist with clothing management d/t standing within electric lift Bathing: bed bath with sister helping to wash backside Tub Shower transfers: N/A; pt has walk in shower in mom's bathroom (wc does fit in this bathroom but pt reports inability to transfer from wc and requires arm rests on a bench for a successful transfer attempt from any DME).  Tub bench is in pt's bathroom, but lift does not fit through bathroom doorway and pt  reports inability to transfer up from tub bench (currently unable to manage either transfer)   Equipment: see above  IADLs: Shopping: [employer] for shopping Light housekeeping: modified indep from wc level Meal Prep: modified indep from wc level Community mobility: relies on community transportation or family members Medication management: indep Landscape architect: indep Handwriting: NT; pt denies any FMC challenges  MOBILITY STATUS: Hx of falls  POSTURE COMMENTS:  Rounded shoulders, forward head, anterior pelvic tilt, and weight shift left   ACTIVITY TOLERANCE: Activity tolerance: Per PT; currently standing 30-60 sec within Light Gait harness/lift; currently non-ambulatory  UPPER EXTREMITY ROM:  BUEs WFL  UPPER EXTREMITY MMT:     MMT Right eval Left eval  Shoulder flexion 4+ 4  Shoulder abduction 4+ 4  Shoulder adduction     Shoulder extension    Shoulder internal rotation 4+ 4+  Shoulder external rotation 4 4  Middle trapezius    Lower trapezius    Elbow flexion 4+ 4+  Elbow extension 4+ 4+  Wrist flexion 4+ 4+  Wrist extension 4+ 4+  Wrist ulnar deviation    Wrist radial deviation    Wrist pronation    Wrist supination    (Blank rows = not tested)  HAND FUNCTION/COORDINATION:  R/L WNL; pt denies any coordination deficits/ able to manipulate pills/clothing fasteners/etc without difficulty  11/15/23: *Measures taken d/t PT note revealing pt with increased difficulty manipulating smaller ADL supplies during reaching activities in PT session   11/15/23: Grip strength: R: 70 lbs, L: 63 lbs Pinch strength: Lateral: R: 16 lbs, L: 15 lbs; 3 point pinch: R: 12 lbs, L: 13 lbs 9 hole peg test: Right: 25 sec, L 49 sec   SENSATION: WFL  EDEMA: No visible edema in BUEs  MUSCLE TONE: BUEs WNL  COGNITION: Overall cognitive status: Within functional limits for tasks assessed  VISION: wears glasses all the time, no reports of diplopia  PERCEPTION: Not tested  PRAXIS: WFL  OBSERVATIONS:  Pt pleasant, cooperative, and motivated to work towards improving indep with ADLs.                                                                                                     TREATMENT DATE: 12/11/23 Therapeutic Activity: -Core stability addressed with forward and lateral reaching patterns: pt sorted deck of cards across table top; wc arm rests removed to further challenge core stability.  Alternated reaching reps between R/L.  -Bilat FMC/dexterity skills and forward/lateral reaching patterns addressed: pt worked to pick up washers from magnetic dish, storing up to 3 in hand, and discarding 1 by 1 onto vertical dowel.  Dowel position alternated to challenge reaching patterns noted above.  Alternated reaching reps between R/L.  PATIENT EDUCATION: Education details: bilat FMC skills and core stability Person  educated: Patient Education method: Explanation, vc Education comprehension: verbalized understanding, demonstrated understanding, further training needed  HOME EXERCISE PROGRAM: IR A/AAROM to promote shoulder flexibility for peri care/bathing  GOALS: Goals reviewed with patient? Yes  SHORT TERM GOALS: Target date: 12/25/23  Pt will perform bed bath with set up  only. Baseline: Eval: Min-mod A for posterior washing; 09/28/23: Min A for posterior washing; 11/13/23: Able to manage bed bath but sister helps with thoroughness, per pt's preference; pt in agreement to trial bath sitting EOB or in wc before next OT session. Goal status: achieved/d/c  2.  Pt will utilize sliding board for wc<>drop arm commode transfer with SBA. Baseline: Eval: Currently using electric lift for transfer to [facility]; 09/28/23: Not yet completed at home but transfers to commode have been easier without sliding board d/t pt implementing a scoot pivot. Goal status: d/c (pt does not prefer sliding board for commode transfer)  3.  Pt will be indep to perform HEP for maintaining BUE strength for ADLs and functional transfers. Baseline: Eval: HEP not yet initiated; 09/28/23: Pt is working on core stability exercises from wc, cane stretches for bilat shoulder IR, and tricep extension via wc pushups; 11/13/23: indep Goal status: achieved  LONG TERM GOALS: Target date: 02/05/24  Pt will perform sink bath with set up A. (Revised on 11/13/23 from min A to set up) Baseline: Eval: Currently bed bathing d/t inability to stand at sink without electric lift (lift does not fit into pt's bathroom); 09/28/23: still completing bed bath as pt is not yet able to stand; 11/13/23: Not yet attempted; pt encouraged to attempt before next OT session now that bed bath goal has been met Goal status: ongoing   2.  Pt will perform wc<>tub bench transfer with min A. Baseline: Eval: Unable; 09/28/23: Performed in OT clinic with min A, but not yet in the home Goal  status: d/c (pt prefers sink bath)  3.  Pt will perform squat pivot transfer wc<>BSC with SBA.  Baseline: Eval: Eval: Currently using electric lift for transfer to [facility]; 09/28/23: Not yet completed in clinic with [facility], and using lift for [facility] in the home still; 11/13/23: Still using Camie lift at home and in clinic transfers are all scoot rather than squat pivots d/t LE weakness. Goal status: ongoing  4. Pt will perform scoot transfer wc<>toilet using grab bars with supv to allow toileting in community setting.  Baseline: Eval: Pt limits community outings d/t requiring use of lift for [facility] transfers; 09/28/23: Pt has performed 2 successful scoot pivot  Transfers to toilet in OT clinic with min A, extra time.  Further trials with clothing management on toilet needed, but pt can lower and hike  pants while sititng edge of mat via lateral leaning and min A to maintain core stability; 11/13/23: Pt performs with CGA and extra time.    Goal status: ongoing  5.  Pt will complete seated clothing management with min A to enable voiding in handicapped stall within community. Baseline: Recert 11/13/23: Pt can transfer to toilet with increased time and effort, but has not yet attempted clothing management or voiding while seated on commode in community setting.  Pt can lower pants to knees while seated in wc, but requires mod A to hike over hips from seated position.   6.  Pt will tolerate standing x1 min with close supv and BUE support to manage clothing in prep for toileting. Baseline: Eval: Currently standing in electric lift only, or within parallel bars without lift; 09/28/23: Not yet attempted; 11/13/23: Pt is able to perform static standing with heavy BUE support on parallel bars with PT Goal status: in progress  7.  Pt will increase bilat grip strength by (TBD) in order to securely grasp walker sufficiently for functional transfers and ambulation.  Baseline: Recert 11/13/23: TBD (PT note read following OT session,  indicating pt with difficulty securing items in hand)  Goal status: New  8.  Pt will increase bilat [facility] skills in order to manipulate small ADL supplies with reduced dropping as indicated by (TBD) improvement in 9 hole peg test. Baseline: Recert 11/13/23: 9 hole peg test TBD (PT note read following OT session, indicating pt with difficulty manipulating and securing items in hands)  Goal status: New  ASSESSMENT: CLINICAL IMPRESSION: Pt requested to avoid transfers today d/t experiencing clonus in BLEs; pt will see MD later this week with hopes to determine if this symptom is a result of pt's new medication.  Good tolerance to therapeutic activities noted above given intermittent rest breaks with core stability activities.  Occasional UE support required when reaching outside BOS to the far R when sorting cards, but pt self recovers with R arm to table top.  Pt will continue to benefit from skilled OT to work towards above noted goals in OT poc, working to maximize indep with daily tasks while reducing burden of care on caregivers.   PERFORMANCE DEFICITS: in functional skills including ADLs, IADLs, strength, pain, flexibility, Gross motor control, mobility, balance, body mechanics, endurance, and decreased knowledge of use of DME, and psychosocial skills including coping strategies, environmental adaptation, habits, and routines and behaviors.   IMPAIRMENTS: are limiting patient from ADLs, IADLs, and social participation.   CO-MORBIDITIES: has co-morbidities such as neuropathy, back pain, obesity, MS, HTN that affects occupational performance. Patient will benefit from skilled OT to address above impairments and improve overall function.  MODIFICATION OR ASSISTANCE TO COMPLETE EVALUATION: No modification of tasks or assist necessary to complete an evaluation.  OT OCCUPATIONAL PROFILE AND HISTORY: Detailed assessment: Review of records and additional review of physical, cognitive, psychosocial history  related to current functional performance.  CLINICAL DECISION MAKING: Moderate - several treatment options, min-mod task modification necessary  REHAB POTENTIAL: Good  EVALUATION COMPLEXITY: Moderate   PLAN:  OT FREQUENCY: 2x/week  OT DURATION: 12 weeks  PLANNED INTERVENTIONS: 97168 OT Re-evaluation, 97535 self care/ADL training, 02889 therapeutic exercise, 97530 therapeutic activity, 97112 neuromuscular re-education, 97140 manual therapy, 97116 gait training, 02989 moist heat, 97010 cryotherapy, balance training, functional mobility training, psychosocial skills training, energy conservation, coping strategies training, patient/family education, and DME and/or AE instructions  RECOMMENDED OTHER SERVICES: None at this time (Pt currently receiving PT services in this clinic)  CONSULTED AND AGREED WITH PLAN OF CARE: Patient  PLAN FOR NEXT SESSION: see above  Inocente Blazing, MS, OTR/L   Inocente MARLA Blazing, OT 12/13/2023, 9:57 AM

## 2023-12-13 NOTE — Therapy (Signed)
 OUTPATIENT PHYSICAL THERAPY NEURO TREATMENT   Patient Name: Lisa Williams MRN: [id number] DOB:1960/12/30, 63 y.o., female Today's Date: 12/14/2023   PCP: Lenon Na  REFERRING PROVIDER: Lenon Na  END OF SESSION:  PT End of Session - 12/13/23 1123     Visit Number 42    Number of Visits 47    Date for PT Re-Evaluation 12/27/23   corrected   Progress Note Due on Visit 50    PT Start Time 1147    PT Stop Time 1230    PT Time Calculation (min) 43 min    Equipment Utilized During Treatment Gait belt    Activity Tolerance Patient tolerated treatment well;No increased pain    Behavior During Therapy [facility] for tasks assessed/performed             Past Medical History:  Diagnosis Date   Abdominal pain, right upper quadrant    Back pain    Calculus of kidney 12/09/2013   Chronic back pain    unspecified   Chronic left shoulder pain 07/19/2015   Complication of anesthesia    Functional disorder of bladder    other   Galactorrhea 11/26/2014   Chronic    Hereditary and idiopathic neuropathy 08/19/2013   History of kidney stones    HPV test positive    Hypercholesteremia 08/19/2013   Hypertension    Incomplete bladder emptying    Microscopic hematuria    MS (multiple sclerosis) (HCC)    Muscle spasticity 05/21/2014   Nonspecific findings on examination of urine    other   Osteopenia    PONV (postoperative nausea and vomiting)    Status post laparoscopic supracervical hysterectomy 11/26/2014   Tobacco user 11/26/2014   Wrist fracture    Past Surgical History:  Procedure Laterality Date   bilateral tubal ligation  1996   BREAST CYST EXCISION Left 2002   CYST EXCISION Left 05/10/2022   Procedure: CYST REMOVAL;  Surgeon: Rodolph Romano, MD;  Location: [facility] ORS;  Service: General;  Laterality: Left;   FRACTURE SURGERY     KNEE SURGERY     right   LAPAROSCOPIC SUPRACERVICAL HYSTERECTOMY  08/05/2013   ORIF WRIST FRACTURE Left  01/17/2017   Procedure: OPEN REDUCTION INTERNAL FIXATION (ORIF) WRIST FRACTURE;  Surgeon: Leora Lynwood SAUNDERS, MD;  Location: [facility] OR[facility];  Service: Orthopedics;  Laterality: Left;   RADIOLOGY WITH ANESTHESIA N/A 03/18/2020   Procedure: MRI WITH ANESTHESIA CERVICAL SPINE AND BRAIN  WITH AND WITHOUT CONTRAST;  Surgeon: Radiologist, Medication, MD;  Location: [facility] OR;  Service: Radiology;  Laterality: N/A;   TUBAL LIGATION Bilateral    VAGINAL HYSTERECTOMY  03/2006   Patient Active Problem List   Diagnosis Date Noted   Sinus tachycardia 07/07/2023   Hypokalemia 07/29/2022   Weakness of both lower extremities 07/29/2022   Abscess of left groin 11/15/2021   Abnormal LFTs 11/15/2021   Acute respiratory disease due to COVID-19 virus 11/21/2020   Weakness    Hypoalbuminemia due to protein-calorie malnutrition [facility])    Neurogenic bowel    Neurogenic bladder    Labile blood pressure    Neuropathic pain    Abscess of female pelvis    SVT (supraventricular tachycardia) (HCC)    Radial styloid tenosynovitis 03/12/2018   Wheelchair dependence 02/27/2018   Localized osteoporosis with current pathological fracture with routine healing 01/19/2017   Wrist fracture 01/16/2017   Sprain of ankle 03/23/2016   Closed fracture of lateral malleolus 03/16/2016   Health care maintenance 01/24/2016  Blood pressure elevated without history of HTN 10/25/2015   Essential hypertension 10/25/2015   Multiple sclerosis (HCC) 10/02/2015   Chronic left shoulder pain 07/19/2015   Multiple sclerosis exacerbation (HCC) 07/14/2015   MS (multiple sclerosis) (HCC) 11/26/2014   Increased body mass index 11/26/2014   HPV test positive 11/26/2014   Status post laparoscopic supracervical hysterectomy 11/26/2014   Galactorrhea 11/26/2014   Back ache 05/21/2014   Adiposity 05/21/2014   Disordered sleep 05/21/2014   Muscle spasticity 05/21/2014   Spasticity 05/21/2014   Calculus of kidney 12/09/2013   Renal colic 12/09/2013    Hypercholesteremia 08/19/2013   Hereditary and idiopathic neuropathy 08/19/2013   Hypercholesterolemia without hypertriglyceridemia 08/19/2013   Bladder infection, chronic 07/25/2012   Disorder of bladder function 07/25/2012   Incomplete bladder emptying 07/25/2012   Microscopic hematuria 07/25/2012   Right upper quadrant pain 07/25/2012    ONSET DATE: 1995  REFERRING DIAG: MS  THERAPY DIAG:  Muscle weakness (generalized)  Other lack of coordination  Multiple sclerosis exacerbation (HCC)  Difficulty in walking, not elsewhere classified  Unsteadiness on feet  Abnormality of gait and mobility  Other abnormalities of gait and mobility  Rationale for Evaluation and Treatment: Rehabilitation  SUBJECTIVE:                                                                                                                                                                                             SUBJECTIVE STATEMENT:  Pt reports feeling okay but still dealing with clonus- No episodes during OT session.    Pt accompanied by: self  PERTINENT HISTORY:  Patient is returning to PT s/p hospitalization.  s/p ORIF of R tibia shaft fracture 09/23/2022. Patient has weakness in BLE with RLE>LLE. She drives with hand controls. Patient has been diagnosed with MS in 1995. PMH includes: back pain, CBP, chronic L shoulder pain, galactorrhea, neuropathy, HPV, hypercholestermeia, HTN, MS, osteopenia, PONV, wrist fracture. Additional order for other closed fracture of proximal end of R tibia with routine healing. Still has to use [employer].   PAIN:  Are you having pain? Occasional pain in RLE; primarily in knee  PRECAUTIONS: Fall  RED FLAGS: None   WEIGHT BEARING RESTRICTIONS: No  FALLS: Has patient fallen in last 6 months? No  LIVING ENVIRONMENT: Lives with: lives with their family Lives in: House/apartment Stairs: ramp Has following equipment at home: Vannie - 2 wheeled, Wheelchair  (manual), shower chair, and Grab bars  PLOF: Independent with household mobility with device  PATIENT GOALS: to get her independence back. To be able to get into car, toilet, and get dressed independently  OBJECTIVE:  Note: Objective measures were completed at Evaluation unless otherwise noted.  DIAGNOSTIC FINDINGS: MRI of the brain 03/18/2020 showed multiple T2/FLAIR hyperintense foci in the periventricular, juxtacortical and deep white matter.  There were no infratentorial lesions noted.  None of the foci enhanced.   Due to severe claustrophobia, the study was done with conscious sedation in the hospital   COGNITION: Overall cognitive status: Within functional limits for tasks assessed   SENSATION: Lack of sensation in feet, loss in bilateral lateral aspect of knee  COORDINATION: Does not have the strength for functional LE heel slide test   MUSCLE TONE: BLE mild tone    POSTURE: rounded shoulders, forward head, anterior pelvic tilt, and weight shift left   LOWER EXTREMITY MMT:    MMT Right Eval Left Eval  Hip flexion 0.9 1.5  Hip extension    Hip abduction 1.8 2.6  Hip adduction 3.1 1.9  Hip internal rotation    Hip external rotation    Knee flexion 0.9 1.5  Knee extension 0.8 1.2  Ankle dorsiflexion    Ankle plantarflexion    Ankle inversion    Ankle eversion    (Blank rows = not tested)  BED MOBILITY:  Assess in future session due to limited time  TRANSFERS: Assistive device utilized: Bariatric RW with sit to stands, slide board to table  Sit to stand: unable to reach full stand Stand to sit: unable to reach full stand  Chair to chair: slide board with CGA   GAIT: Unable to ambulate at this time.   FUNCTIONAL TESTS:  Sit to stand: tricep press with BUE; unable to bring arm to walker.    Function In Sitting Test (FIST)  (1/2 femur on surface; hips/knees flexed to 90deg)   - indicate bed or mat table / step stool if used  SCORING KEY: 4 =  Independent (completes task independently & successfully) 3 = Verbal Cues/Increased Time (completes task independently & successfully and only needs more time/cues) 2 = Upper Extremity Support (must use UE for support or assistance to complete successfully) 1 = Needs Assistance (unable to complete w/o physical assist; DOCUMENT LEVEL: min, mod, max) 0 = Dependent (requires complete physical assist; unable to complete successfully even w/ physical assist)  Randomly Administer Once Throughout Exam  4 - Anterior Nudge (superior sternum)  4 - Posterior Nudge (between scapular spines)  4 - Lateral Nudge (to dominant side at acromion)     4 - Static sitting (30 seconds)  4 - Sitting, shake 'no' (left and right)  4 - Sitting, eyes closed (30 seconds)   0 - Sitting, lift foot (dominant side, lift foot 1 inch twice)    2 - Pick up object from behind (object at midline, hands breadth posterior)  3 - Forward reach (use dominant arm, must complete full motion) 2 - Lateral reach (use dominant arm, clear opposite ischial tuberosity) 2 - Pick up object from floor (from between feet)   2 - Posterior scooting (move backwards 2 inches)  2 - Anterior scooting (move forward 2 inches)  2 - Lateral scooting (move to dominant side 2 inches)    TOTAL = 39/56  Notes/comments: slide board tranfer to/from table   MCD > 5 points MCID for IP REHAB > 6 points  Function In Sitting Test (FIST) 08/14/23 (1/2 femur on surface; hips/knees flexed to 90deg)   - indicate bed or mat table / step stool if used  SCORING KEY: 4 = Independent (completes task independently & successfully)  3 = Verbal Cues/Increased Time (completes task independently & successfully and only needs more time/cues) 2 = Upper Extremity Support (must use UE for support or assistance to complete successfully) 1 = Needs Assistance (unable to complete w/o physical assist; DOCUMENT LEVEL: min, mod, max) 0 = Dependent (requires complete physical  assist; unable to complete successfully even w/ physical assist)  Randomly Administer Once Throughout Exam  4 - Anterior Nudge (superior sternum)  4 - Posterior Nudge (between scapular spines)  4 - Lateral Nudge (to dominant side at acromion)     4 - Static sitting (30 seconds)  4 - Sitting, shake 'no' (left and right)  4 - Sitting, eyes closed (30 seconds)   0 - Sitting, lift foot (dominant side, lift foot 1 inch twice)    4 - Pick up object from behind (object at midline, hands breadth posterior)  4 - Forward reach (use dominant arm, must complete full motion) 4 - Lateral reach (use dominant arm, clear opposite ischial tuberosity)  - Pick up object from floor (from between feet)   3 - Posterior scooting (move backwards 2 inches)  3 - Anterior scooting (move forward 2 inches)  3 - Lateral scooting (move to dominant side 2 inches)    TOTAL = 47/56  MCD > 5 points MCID for IP REHAB > 6 points                                                                                                                              TREATMENT DATE: 12/13/23   TherAct:  In // bars with gait belt and min A:  STS attempts x 6 total -  with bilateral UE support on // bars- initially CGA to stand but increased LE trembling so on subsequent stands provided Min/mod A- pt exhibited some bilateral Leg trembling on each stand and poor eccentric control with sitting (improved with VC to sit slowly as able).   Static stand with erect posture- VC for gluteal contraction and to look ahead (approx 20-30 sec) - x 3 trials- All with only CGA once in standing position. Patient again limited secondary to increased trembling.   Unable to take any steps  with attempted ambulation in // bars, patient was fatigued and standing terminated after several trials due to increased trembling today.    Neuromuscular Re-Education: neuromuscular reeducation of movement, balance, coordination, kinesthetic sense, posture and  proprioception for sitting and/or standing:  -lateral trunk lean to assist with reaching toward floor- using 5# AW 2 x 10 reps  - Blaze pods (place 6 pods surrounding patient in front and out to side (just to end of her max reaches) on tray table and 2 stools- like a drum set- Setting on random and patient performed 30 sec of tapping with either UE (using 2 colors - 1 for left side and 1 for right to add a cognitive challenge)  Patient performed 6 rounds today- brief rest break between each  round -Card game- set up the same with approx 20 cards laying face up- instructed to complete against author and call out a color and reach quickly to challenge her reaction time and proprioception/ weight shifting/sitting balance- x 3 games with patient reaching as quickly as possible to pick up either a red or black card to score more points than her opponent.    Thorough rest breaks provided during session to prevent fatigue       PATIENT EDUCATION: Education details: Pt educated throughout session about proper posture and technique with exercises. Improved exercise technique, movement at target joints, use of target muscles after min to mod verbal, visual, tactile cues.  Person educated: Patient Education method: Explanation, Demonstration, Tactile cues, and Verbal cues Education comprehension: verbalized understanding, returned demonstration, verbal cues required, tactile cues required, and needs further education  HOME EXERCISE PROGRAM: Stand 3x/day in stander   GOALS: Goals reviewed with patient? Yes  SHORT TERM GOALS: Target date: 08/08/2023  Patient will be independent in home exercise program to improve strength/mobility for better functional independence with ADLs.  Baseline: 4/8: compliance Goal status: MET    LONG TERM GOALS: Target date: 12/27/2023  Patient will tolerate five consecutive stands with UE support from wheelchair to standing to improve functional mobility with additional  cushion and RW.  Baseline: unable to perform 4/8:unable to perform full stand 5/20: perform next session 5/29: two full stands with CGA and cushion on her seat 7/31: able to stand 3 times from [facility]. With no addition cushion  in parallel bars.  Goal status: Ongoing   2.  Patient will ambulate 10 ft with bRW with wheelchair follow.  Baseline:  unable to ambulate 4/8: unable to ambulate 5/20: able to ambulate length of // bars in previous session 5/27: ambulate 2.5 lengths of // bars with two seated rest breaks with min A for weight shift and wheelchair follow  7/31: able to ambulate in parallel bars 3 ft with min assist and +2 follow in WC> difficulty advancing the LLE on this day.  Goal status: Ongoing   3.  Patient will improve FIST score >6 points to demonstrate improved stability and ability to perform ADLs.  Baseline:  3/6: 39/56 4/8: 47/ 56 5/20: 48/56 7/31:46/56 Goal status: MET  4.  Patient will perform toileting with mod I at home for improved independence.  Baseline: 3/6: requires assistance 4/8: requires dependence on machine 5/20: requires assistance with use of wipe buddy.  7/31: continues to require use of sara steady and wipe buddy for safety and hygiene Goal status: ongoing     ASSESSMENT:  CLINICAL IMPRESSION:  Patient arrived with good motivation and able to progress with more sit to stands in terms of reps and later able to perform some static stand with increased time followed by some lateral weight shifting. She was unable to take any functional steps in // bars despite multiple efforts. She did demonstrate good postural awareness with scap rows and good return demo with tricep press down. Discussed her ongoing clonus and patient to follow up with MD.  Pt will continue to benefit from skilled therapy to address remaining deficits in order to improve overall QoL and return to PLOF.       OBJECTIVE IMPAIRMENTS: Abnormal gait, cardiopulmonary status limiting activity,  decreased activity tolerance, decreased balance, decreased coordination, decreased endurance, decreased mobility, difficulty walking, decreased ROM, decreased strength, hypomobility, increased fascial restrictions, impaired perceived functional ability, impaired flexibility, impaired sensation, improper body mechanics, and postural dysfunction.  ACTIVITY LIMITATIONS: carrying, lifting, bending, sitting, standing, squatting, sleeping, stairs, transfers, bed mobility, continence, bathing, toileting, dressing, self feeding, reach over head, hygiene/grooming, locomotion level, and caring for others  PARTICIPATION LIMITATIONS: meal prep, cleaning, laundry, interpersonal relationship, driving, shopping, community activity, and church  PERSONAL FACTORS: Age, Past/current experiences, Time since onset of injury/illness/exacerbation, Transportation, and 3+ comorbidities: ack pain, CBP, chronic L shoulder pain, galactorrhea, neuropathy, HPV, hypercholestermeia, HTN, MS, osteopenia, PONV, wrist fracture are also affecting patient's functional outcome.   REHAB POTENTIAL: Good  CLINICAL DECISION MAKING: Evolving/moderate complexity  EVALUATION COMPLEXITY: Moderate  PLAN:  PT FREQUENCY: 2x/week  PT DURATION: 12 weeks  PLANNED INTERVENTIONS: 97164- PT Re-evaluation, 97110-Therapeutic exercises, 97530- Therapeutic activity, 97112- Neuromuscular re-education, 97535- Self Care, [id number]- Manual therapy, [phone]- Gait training, [phone]- Orthotic Fit/training, [phone]- Canalith repositioning, [id number]- Aquatic Therapy, [phone]- Electrical stimulation (unattended), [phone]- Electrical stimulation (manual), [id number]- Ultrasound, [id number]- Traction (mechanical), Patient/Family education, Balance training, Stair training, Taping, Dry Needling, Joint mobilization, Joint manipulation, Spinal manipulation, Spinal mobilization, Scar mobilization, Compression bandaging, Vestibular training, Visual/preceptual remediation/compensation, Cognitive  remediation, DME instructions, Cryotherapy, Moist heat, and Biofeedback  PLAN FOR NEXT SESSION:   sit to stands; try supported walking, continue dynamic core stabilization training and seated reaching tasks to improve weight shifting.    Chyrl London, PT  Physical Therapist - [facility]  8:05 AM 12/14/23

## 2023-12-14 ENCOUNTER — Other Ambulatory Visit: Payer: Self-pay | Admitting: Neurology

## 2023-12-16 NOTE — Therapy (Signed)
 OUTPATIENT OCCUPATIONAL THERAPY NEURO TREATMENT NOTE  Patient Name: Lisa Williams MRN: [id number] DOB:December 29, 1960, 63 y.o., female Today's Date: 12/16/2023  PCP: Dr. Layman Piety   Neurologist Dr. Suanne at [facility] REFERRING PROVIDER: Dr. Layman Piety    END OF SESSION:  OT End of Session - 12/16/23 1921     Visit Number 28    Number of Visits 44    Date for OT Re-Evaluation 02/05/24    Authorization Time Period Reporting period beginning 09/28/23-01/14/24    OT Start Time 1103    OT Stop Time 1145    OT Time Calculation (min) 42 min    Equipment Utilized During Treatment manual wc    Activity Tolerance Patient tolerated treatment well    Behavior During Therapy [facility] for tasks assessed/performed         Past Medical History:  Diagnosis Date   Abdominal pain, right upper quadrant    Back pain    Calculus of kidney 12/09/2013   Chronic back pain    unspecified   Chronic left shoulder pain 07/19/2015   Complication of anesthesia    Functional disorder of bladder    other   Galactorrhea 11/26/2014   Chronic    Hereditary and idiopathic neuropathy 08/19/2013   History of kidney stones    HPV test positive    Hypercholesteremia 08/19/2013   Hypertension    Incomplete bladder emptying    Microscopic hematuria    MS (multiple sclerosis) (HCC)    Muscle spasticity 05/21/2014   Nonspecific findings on examination of urine    other   Osteopenia    PONV (postoperative nausea and vomiting)    Status post laparoscopic supracervical hysterectomy 11/26/2014   Tobacco user 11/26/2014   Wrist fracture    Past Surgical History:  Procedure Laterality Date   bilateral tubal ligation  1996   BREAST CYST EXCISION Left 2002   CYST EXCISION Left 05/10/2022   Procedure: CYST REMOVAL;  Surgeon: Rodolph Romano, MD;  Location: [facility] OR[facility];  Service: General;  Laterality: Left;   FRACTURE SURGERY     KNEE SURGERY     right   LAPAROSCOPIC SUPRACERVICAL HYSTERECTOMY   08/05/2013   ORIF WRIST FRACTURE Left 01/17/2017   Procedure: OPEN REDUCTION INTERNAL FIXATION (ORIF) WRIST FRACTURE;  Surgeon: Leora Lynwood SAUNDERS, MD;  Location: [facility];  Service: Orthopedics;  Laterality: Left;   RADIOLOGY WITH ANESTHESIA N/A 03/18/2020   Procedure: MRI WITH ANESTHESIA CERVICAL SPINE AND BRAIN  WITH AND WITHOUT CONTRAST;  Surgeon: Radiologist, Medication, MD;  Location: [facility] OR;  Service: Radiology;  Laterality: N/A;   TUBAL LIGATION Bilateral    VAGINAL HYSTERECTOMY  03/2006   Patient Active Problem List   Diagnosis Date Noted   Sinus tachycardia 07/07/2023   Hypokalemia 07/29/2022   Weakness of both lower extremities 07/29/2022   Abscess of left groin 11/15/2021   Abnormal LFTs 11/15/2021   Acute respiratory disease due to COVID-19 virus 11/21/2020   Weakness    Hypoalbuminemia due to protein-calorie malnutrition [facility])    Neurogenic bowel    Neurogenic bladder    Labile blood pressure    Neuropathic pain    Abscess of female pelvis    SVT (supraventricular tachycardia) (HCC)    Radial styloid tenosynovitis 03/12/2018   Wheelchair dependence 02/27/2018   Localized osteoporosis with current pathological fracture with routine healing 01/19/2017   Wrist fracture 01/16/2017   Sprain of ankle 03/23/2016   Closed fracture of lateral malleolus 03/16/2016   Health care maintenance  01/24/2016   Blood pressure elevated without history of HTN 10/25/2015   Essential hypertension 10/25/2015   Multiple sclerosis (HCC) 10/02/2015   Chronic left shoulder pain 07/19/2015   Multiple sclerosis exacerbation (HCC) 07/14/2015   MS (multiple sclerosis) (HCC) 11/26/2014   Increased body mass index 11/26/2014   HPV test positive 11/26/2014   Status post laparoscopic supracervical hysterectomy 11/26/2014   Galactorrhea 11/26/2014   Back ache 05/21/2014   Adiposity 05/21/2014   Disordered sleep 05/21/2014   Muscle spasticity 05/21/2014   Spasticity 05/21/2014   Calculus of kidney  12/09/2013   Renal colic 12/09/2013   Hypercholesteremia 08/19/2013   Hereditary and idiopathic neuropathy 08/19/2013   Hypercholesterolemia without hypertriglyceridemia 08/19/2013   Bladder infection, chronic 07/25/2012   Disorder of bladder function 07/25/2012   Incomplete bladder emptying 07/25/2012   Microscopic hematuria 07/25/2012   Right upper quadrant pain 07/25/2012   ONSET DATE: 5/24 Amon and broke R leg which caused beginning of ADL decline; diagnosed with MS in 1995)  REFERRING DIAG: MS  THERAPY DIAG:  Muscle weakness (generalized)  Other lack of coordination  Multiple sclerosis exacerbation (HCC)  Rationale for Evaluation and Treatment: Rehabilitation  SUBJECTIVE:  SUBJECTIVE STATEMENT: Pt agreeable to attempt toilet transfer and clothing management today, and reports no challenges with LE clonus yet today. Pt accompanied by: self  PERTINENT HISTORY: Pt reports increased difficulty with basic self care tasks since breaking her R leg last May.  Since that time, mobility and ADLs have declined, with pt requiring assist from private caregivers and family to manage bathing, toileting, dressing, and functional transfers.  PRECAUTIONS: Fall  WEIGHT BEARING RESTRICTIONS: No  PAIN: 11/27/23: No pain Are you having pain? No; occasional pain in R lower back, but not today  FALLS: Has patient fallen in last 6 months? No Last fall was May 17 of 2024  LIVING ENVIRONMENT: Lives with: lives with their family (including mother who has dementia and sister [name])  Lives in: 2 level home but pt resides on main level Stairs: ramp  Has following equipment at home: Wheelchair (manual), Shower bench, and hand held shower shower, 1 grab bar in the shower, 3in1 commode, sit to stand lift (electric), FWW, hospital bed  PLOF: modified indep with ADL/IADLs prior to May of 7975   PATIENT GOALS: Increase independence with basic self care tasks  OBJECTIVE:  Note: Objective measures  were completed at Evaluation unless otherwise noted.  HAND DOMINANCE: Right  ADLs:  Overall ADLs: assist provided from paid caregiver and sister Transfers/ambulation related to ADLs: sit to stand lift for all transfers, set up for sliding board in/out of bed  Eating: indep  Grooming: modified indep (wc level in mom's bathroom)  UB Dressing: set up (can't access her bedroom closet)  LB Dressing: able to sit on side of bed to don all LB clothing; assist to hike pants/underwear Toileting: assist with clothing management d/t standing within electric lift Bathing: bed bath with sister helping to wash backside Tub Shower transfers: N/A; pt has walk in shower in mom's bathroom (wc does fit in this bathroom but pt reports inability to transfer from wc and requires arm rests on a bench for a successful transfer attempt from any DME).  Tub bench is in pt's bathroom, but lift does not fit through bathroom doorway and pt reports inability to transfer up from tub bench (currently unable to manage either transfer)   Equipment: see above  IADLs: Shopping: [employer] for shopping Light housekeeping: modified indep from wc  level Meal Prep: modified indep from wc level Community mobility: relies on community transportation or family members Medication management: indep Landscape architect: indep Handwriting: NT; pt denies any [facility] challenges  MOBILITY STATUS: Hx of falls  POSTURE COMMENTS:  Rounded shoulders, forward head, anterior pelvic tilt, and weight shift left   ACTIVITY TOLERANCE: Activity tolerance: Per PT; currently standing 30-60 sec within Light Gait harness/lift; currently non-ambulatory  UPPER EXTREMITY ROM:  BUEs WFL  UPPER EXTREMITY MMT:     MMT Right eval Left eval  Shoulder flexion 4+ 4  Shoulder abduction 4+ 4  Shoulder adduction    Shoulder extension    Shoulder internal rotation 4+ 4+  Shoulder external rotation 4 4  Middle trapezius    Lower trapezius    Elbow  flexion 4+ 4+  Elbow extension 4+ 4+  Wrist flexion 4+ 4+  Wrist extension 4+ 4+  Wrist ulnar deviation    Wrist radial deviation    Wrist pronation    Wrist supination    (Blank rows = not tested)  HAND FUNCTION/COORDINATION:  R/L WNL; pt denies any coordination deficits/ able to manipulate pills/clothing fasteners/etc without difficulty  11/15/23: *Measures taken d/t PT note revealing pt with increased difficulty manipulating smaller ADL supplies during reaching activities in PT session   11/15/23: Grip strength: R: 70 lbs, L: 63 lbs Pinch strength: Lateral: R: 16 lbs, L: 15 lbs; 3 point pinch: R: 12 lbs, L: 13 lbs 9 hole peg test: Right: 25 sec, L 49 sec   SENSATION: WFL  EDEMA: No visible edema in BUEs  MUSCLE TONE: BUEs WNL  COGNITION: Overall cognitive status: Within functional limits for tasks assessed  VISION: wears glasses all the time, no reports of diplopia  PERCEPTION: Not tested  PRAXIS: WFL  OBSERVATIONS:  Pt pleasant, cooperative, and motivated to work towards improving indep with ADLs.                                                                                                     TREATMENT DATE: 12/12/23 Self Care: -Scoot/pivot transfer wc<>toilet with min guard.  Min vc to attempt larger and fewer scoots, rather than smaller scoots that require higher reps to fully transfer to/from toilet.  OT moved wc to allow lateral support once on toilet to increase safety and indep for clothing management.  Pt able to perform lateral shifts to lower pants to knees while seated on commode with min A using L grab bar and wc to pt's R side for UB support. Transferred back to wc with pt performing a tricep ext/buttocks lift in chair for therapist to then hike pants back up, recommended assist for [facility] after transfer.  Therapeutic Activity: -Over hand ball toss/catch/bounce to promote core stability for ADLs and transfers; completed at wc level without arm rests to increase  task challenge.  PATIENT EDUCATION: Education details: toilet transfer Person educated: Patient Education method: Explanation, vc Education comprehension: verbalized understanding, demonstrated understanding, further training needed  HOME EXERCISE PROGRAM: IR A/AAROM to promote shoulder flexibility for peri care/bathing  GOALS: Goals reviewed with patient? Yes  SHORT TERM GOALS: Target date: 12/25/23  Pt will perform bed bath with set up only. Baseline: Eval: Min-mod A for posterior washing; 09/28/23: Min A for posterior washing; 11/13/23: Able to manage bed bath but sister helps with thoroughness, per pt's preference; pt in agreement to trial bath sitting EOB or in wc before next OT session. Goal status: achieved/d/c  2.  Pt will utilize sliding board for wc<>drop arm commode transfer with SBA. Baseline: Eval: Currently using electric lift for transfer to [facility]; 09/28/23: Not yet completed at home but transfers to commode have been easier without sliding board d/t pt implementing a scoot pivot. Goal status: d/c (pt does not prefer sliding board for commode transfer)  3.  Pt will be indep to perform HEP for maintaining BUE strength for ADLs and functional transfers. Baseline: Eval: HEP not yet initiated; 09/28/23: Pt is working on core stability exercises from wc, cane stretches for bilat shoulder IR, and tricep extension via wc pushups; 11/13/23: indep Goal status: achieved  LONG TERM GOALS: Target date: 02/05/24  Pt will perform sink bath with set up A. (Revised on 11/13/23 from min A to set up) Baseline: Eval: Currently bed bathing d/t inability to stand at sink without electric lift (lift does not fit into pt's bathroom); 09/28/23: still completing bed bath as pt is not yet able to stand; 11/13/23: Not yet attempted; pt encouraged to attempt before next OT session now that bed bath goal has been met Goal status: ongoing   2.  Pt will perform wc<>tub bench transfer with min A. Baseline: Eval:  Unable; 09/28/23: Performed in OT clinic with min A, but not yet in the home Goal status: d/c (pt prefers sink bath)  3.  Pt will perform squat pivot transfer wc<>BSC with SBA.  Baseline: Eval: Eval: Currently using electric lift for transfer to [facility]; 09/28/23: Not yet completed in clinic with [facility], and using lift for [facility] in the home still; 11/13/23: Still using Camie lift at home and in clinic transfers are all scoot rather than squat pivots d/t LE weakness. Goal status: ongoing  4. Pt will perform scoot transfer wc<>toilet using grab bars with supv to allow toileting in community setting.  Baseline: Eval: Pt limits community outings d/t requiring use of lift for [facility] transfers; 09/28/23: Pt has performed 2 successful scoot pivot  Transfers to toilet in OT clinic with min A, extra time.  Further trials with clothing management on toilet needed, but pt can lower and hike  pants while sititng edge of mat via lateral leaning and min A to maintain core stability; 11/13/23: Pt performs with CGA and extra time.    Goal status: ongoing  5.  Pt will complete seated clothing management with min A to enable voiding in handicapped stall within community. Baseline: Recert 11/13/23: Pt can transfer to toilet with increased time and effort, but has not yet attempted clothing management or voiding while seated on commode in community setting.  Pt can lower pants to knees while seated in wc, but requires mod A to hike over hips from seated position.   6.  Pt will tolerate standing x1 min with close supv and BUE support to manage clothing in prep for toileting. Baseline: Eval: Currently standing in electric lift only, or within parallel bars without lift; 09/28/23: Not yet attempted; 11/13/23: Pt is able to perform static standing with heavy BUE support on parallel bars with PT Goal status: in progress  7.  Pt will increase bilat grip strength  by (TBD) in order to securely grasp walker sufficiently for functional transfers  and ambulation.  Baseline: Recert 11/13/23: TBD (PT note read following OT session, indicating pt with difficulty securing items in hand)  Goal status: New  8.  Pt will increase bilat [facility] skills in order to manipulate small ADL supplies with reduced dropping as indicated by (TBD) improvement in 9 hole peg test. Baseline: Recert 11/13/23: 9 hole peg test TBD (PT note read following OT session, indicating pt with difficulty manipulating and securing items in hands)  Goal status: New  ASSESSMENT: CLINICAL IMPRESSION: Pt making steady improvements to perform wc<>toilet transfer with better efficiency, including larger scoots.  Pt continues to require rest breaks between transfer and clothing management tasks, but is overall demonstrating less fatigue after completion of this bathroom ADL.  Pt will continue to benefit from skilled OT to work towards above noted goals in OT poc, working to maximize indep with daily tasks while reducing burden of care on caregivers.   PERFORMANCE DEFICITS: in functional skills including ADLs, IADLs, strength, pain, flexibility, Gross motor control, mobility, balance, body mechanics, endurance, and decreased knowledge of use of DME, and psychosocial skills including coping strategies, environmental adaptation, habits, and routines and behaviors.   IMPAIRMENTS: are limiting patient from ADLs, IADLs, and social participation.   CO-MORBIDITIES: has co-morbidities such as neuropathy, back pain, obesity, MS, HTN that affects occupational performance. Patient will benefit from skilled OT to address above impairments and improve overall function.  MODIFICATION OR ASSISTANCE TO COMPLETE EVALUATION: No modification of tasks or assist necessary to complete an evaluation.  OT OCCUPATIONAL PROFILE AND HISTORY: Detailed assessment: Review of records and additional review of physical, cognitive, psychosocial history related to current functional performance.  CLINICAL DECISION MAKING:  Moderate - several treatment options, min-mod task modification necessary  REHAB POTENTIAL: Good  EVALUATION COMPLEXITY: Moderate   PLAN:  OT FREQUENCY: 2x/week  OT DURATION: 12 weeks  PLANNED INTERVENTIONS: 97168 OT Re-evaluation, 97535 self care/ADL training, 02889 therapeutic exercise, 97530 therapeutic activity, 97112 neuromuscular re-education, 97140 manual therapy, 97116 gait training, 02989 moist heat, 97010 cryotherapy, balance training, functional mobility training, psychosocial skills training, energy conservation, coping strategies training, patient/family education, and DME and/or AE instructions  RECOMMENDED OTHER SERVICES: None at this time (Pt currently receiving PT services in this clinic)  CONSULTED AND AGREED WITH PLAN OF CARE: Patient  PLAN FOR NEXT SESSION: see above  Inocente Blazing, MS, OTR/L  Inocente MARLA Blazing, OT 12/16/2023, 7:24 PM

## 2023-12-17 ENCOUNTER — Encounter: Payer: Self-pay | Admitting: Neurology

## 2023-12-17 ENCOUNTER — Ambulatory Visit (INDEPENDENT_AMBULATORY_CARE_PROVIDER_SITE_OTHER): Admitting: Neurology

## 2023-12-17 VITALS — BP 119/72 | HR 69

## 2023-12-17 DIAGNOSIS — Z79899 Other long term (current) drug therapy: Secondary | ICD-10-CM | POA: Diagnosis not present

## 2023-12-17 DIAGNOSIS — G35 Multiple sclerosis: Secondary | ICD-10-CM | POA: Diagnosis not present

## 2023-12-17 DIAGNOSIS — R269 Unspecified abnormalities of gait and mobility: Secondary | ICD-10-CM | POA: Diagnosis not present

## 2023-12-17 DIAGNOSIS — R29898 Other symptoms and signs involving the musculoskeletal system: Secondary | ICD-10-CM

## 2023-12-17 DIAGNOSIS — R252 Cramp and spasm: Secondary | ICD-10-CM

## 2023-12-17 MED ORDER — BACLOFEN 20 MG PO TABS: 20.0000 mg | ORAL_TABLET | Freq: Four times a day (QID) | ORAL | 3 refills | Status: DC

## 2023-12-17 NOTE — Telephone Encounter (Signed)
 Last seen on 09/05/23 Follow up scheduled on 12/17/23   Pt scheduled to be seen today at 3pm

## 2023-12-17 NOTE — Progress Notes (Signed)
 [facility]  PATIENT: Lisa Williams DOB: [birth year]  REFERRING DOCTOR OR PCP: Layman Piety, MD (PCP) SOURCE: Patient, notes from Dr. Jenel, imaging and lab reports, MRI images personally reviewed and summarized below.  _________________________________   HISTORICAL  CHIEF COMPLAINT:  Chief Complaint  Patient presents with   RM10/MS    Pt is here Alone. Pt states that that when she stands up she has a lot of shaking and her legs are weak since starting the Mavenclad.  Pt would like to discuss her Mavenclad.     HISTORY OF PRESENT ILLNESS:  Lisa Williams is a 63 y.o. woman with multiple sclerosis.   She is on Mayzent  and tolerates it well.  Update 12/17/2023 She did Mavenclad in May/June 2025. She is doing worse with more weakness.   She also has more shakes (she states her therapist told her it was clonus).   She feels this is new since starting the Mavenclad.   She nots this upo  She has spasticty, right > left.   She is on baclofen  20 mg po tid and tizandien 4 mg po tid.       She feels stable for the most part.   She broke her leg last year in a fall and has not returned to the preaccident baseline.  She is doing therapy and notes improvement.  She can go about 15 feet with a walker but cannot take any steps without it.  She has weakness in both legs, R worse than L.   Her arms are strong.   She drives with hand controls x 10 years.     She has also lost 12 pounds on phentermine  but she had trouble sleeping so stopped.  Her insurance will not cover Wegovy.    [name]   LFT's have been elevated.  No jaundice, N/V.  She had severe constipation needing visit to hospital.    She has muscle spasms on the right, mostly in the leg.  Tizanidine  and baclofen  have helped. She takes 4 mg po tid  .She opted not to try dantrolene .  She denies sensory symptoms now but had some   She has urinary urgency helped by oxybutynin .   No recent UTI.  Vision is  doing well.  She has never had neuritis.     She has fatigue, worse with heat.  She sleeps well most nights.   Mood and cognition are fine.      MS History: She was diagnosed in 1995 after presenting with right leg weakness and foot drop.   One day, she lost all sensation in her legs.   She saw Dr. Gretta in [place] until his retirement.   She then saw Dr. Lane in [place] and she started to see Dr. Jenel 2 years ago.     She was initially placed on Copaxone x 3 years and then was off all DMTs for many years and then started Rituxan  around 2018 to 2019.  At that time, associated with a UTI, she felt much more weak prompting her to go on a medication.   She started Mayzent  since 2020.   She has tolerated it well.      IMAGING: MRI of the brain 03/18/2020 showed multiple T2/FLAIR hyperintense foci in the periventricular, juxtacortical and deep white matter.  There were no infratentorial lesions noted.  None of the foci enhanced.   Due to severe claustrophobia, the study was done with conscious sedation in the hospital.  MRI of the cervical spine 03/18/2020 showed T2 hyperintense foci at C1-C2, C2-C3, C4-C5, C5-C6 and T1.  There is a disc protrusion at C5-C6 to the left and a larger disc protrusion at C6-C7 to the right that distorts the thecal sac.  The adjacent spinal cord has normal signal.  There could be compression of the right C7 nerve root.     REVIEW OF SYSTEMS: Constitutional: No fevers, chills, sweats, or change in appetite Eyes: No visual changes, double vision, eye pain Ear, nose and throat: No hearing loss, ear pain, nasal congestion, sore throat Cardiovascular: No chest pain, palpitations Respiratory:  No shortness of breath at rest or with exertion.   No wheezes GastrointestinaI: No nausea, vomiting, diarrhea, abdominal pain, fecal incontinence Genitourinary:  No dysuria, urinary retention or frequency.  No nocturia. Musculoskeletal:  No neck pain, back  pain Integumentary: No rash, pruritus, skin lesions Neurological: as above Psychiatric: No depression at this time.  No anxiety Endocrine: No palpitations, diaphoresis, change in appetite, change in weigh or increased thirst Hematologic/Lymphatic:  No anemia, purpura, petechiae. Allergic/Immunologic: No itchy/runny eyes, nasal congestion, recent allergic reactions, rashes  ALLERGIES: Allergies  Allergen Reactions   Amlodipine  Swelling   Betadine [Povidone-Iodine]    Povidone Iodine Rash    HOME MEDICATIONS:  Current Outpatient Medications:    acetaminophen  (TYLENOL ) 500 MG tablet, Take 500-1,000 mg by mouth every 6 (six) hours as needed for moderate pain or headache., Disp: , Rfl:    Cholecalciferol  25 MCG (1000 UT) tablet, Take 5,000 Units by mouth daily., Disp: , Rfl:    MAVENCLAD, 10 TABS, 10 MG TBPK, Take by mouth., Disp: , Rfl:    oxybutynin  (DITROPAN -XL) 10 MG 24 hr tablet, Take 1 tablet (10 mg total) by mouth daily., Disp: 90 tablet, Rfl: 3   rosuvastatin (CRESTOR) 10 MG tablet, Take 10 mg by mouth at bedtime., Disp: , Rfl:    telmisartan (MICARDIS) 40 MG tablet, Take 40 mg by mouth daily., Disp: , Rfl:    tiZANidine  (ZANAFLEX ) 4 MG tablet, TAKE ONE BY MOUTH EVERY MORNING ONE BY MOUTH EVERY EVENING AND TWO BY MOUTH AT BEDTIME, Disp: 120 tablet, Rfl: 0   baclofen  (LIORESAL ) 20 MG tablet, Take 1 tablet (20 mg total) by mouth 4 (four) times daily., Disp: 360 each, Rfl: 3   polyethylene glycol (MIRALAX ) 17 g packet, Take 17 g by mouth 2 (two) times daily. (Patient not taking: Reported on 12/17/2023), Disp: , Rfl:    Siponimod  Fumarate (MAYZENT ) 2 MG TABS, Take 1 tablet (2 mg total) by mouth daily. (Patient not taking: Reported on 12/17/2023), Disp: 30 tablet, Rfl: 11  PAST MEDICAL HISTORY: Past Medical History:  Diagnosis Date   Abdominal pain, right upper quadrant    Back pain    Calculus of kidney 12/09/2013   Chronic back pain    unspecified   Chronic left shoulder pain  07/19/2015   Complication of anesthesia    Functional disorder of bladder    other   Galactorrhea 11/26/2014   Chronic    Hereditary and idiopathic neuropathy 08/19/2013   History of kidney stones    HPV test positive    Hypercholesteremia 08/19/2013   Hypertension    Incomplete bladder emptying    Microscopic hematuria    MS (multiple sclerosis) (HCC)    Muscle spasticity 05/21/2014   Nonspecific findings on examination of urine    other   Osteopenia    PONV (postoperative nausea and vomiting)    Status post laparoscopic  supracervical hysterectomy 11/26/2014   Tobacco user 11/26/2014   Wrist fracture     PAST SURGICAL HISTORY: Past Surgical History:  Procedure Laterality Date   bilateral tubal ligation  1996   BREAST CYST EXCISION Left 2002   CYST EXCISION Left 05/10/2022   Procedure: CYST REMOVAL;  Surgeon: Rodolph Romano, MD;  Location: [facility] OR[facility];  Service: General;  Laterality: Left;   FRACTURE SURGERY     KNEE SURGERY     right   LAPAROSCOPIC SUPRACERVICAL HYSTERECTOMY  08/05/2013   ORIF WRIST FRACTURE Left 01/17/2017   Procedure: OPEN REDUCTION INTERNAL FIXATION (ORIF) WRIST FRACTURE;  Surgeon: Leora Lynwood SAUNDERS, MD;  Location: [facility];  Service: Orthopedics;  Laterality: Left;   RADIOLOGY WITH ANESTHESIA N/A 03/18/2020   Procedure: MRI WITH ANESTHESIA CERVICAL SPINE AND BRAIN  WITH AND WITHOUT CONTRAST;  Surgeon: Radiologist, Medication, MD;  Location: [facility] OR;  Service: Radiology;  Laterality: N/A;   TUBAL LIGATION Bilateral    VAGINAL HYSTERECTOMY  03/2006    FAMILY HISTORY: Family History  Problem Relation Age of Onset   Osteoarthritis Mother    Hypertension Mother    Hyperlipidemia Mother    Nephrolithiasis Father    Hypertension Father    Prostate cancer Father    Kidney disease Father    Heart failure Father    Sickle cell trait Sister    Hypertension Sister    Supraventricular tachycardia Sister    Hypertension Sister    Hypertension Sister     Diabetes Maternal Aunt    Breast cancer Maternal Aunt        great MAT   Diabetes Maternal Grandmother    Liver cancer Maternal Grandmother    Ovarian cancer Paternal Grandmother    Multiple sclerosis Cousin    GU problems Neg Hx    Urolithiasis Neg Hx     SOCIAL HISTORY:  Social History   Socioeconomic History   Marital status: Single    Spouse name: Not on file   Number of children: 0   Years of education: 14   Highest education level: Not on file  Occupational History   Not on file  Tobacco Use   Smoking status: Former    Current packs/day: 0.50    Types: Cigarettes   Smokeless tobacco: Never  Vaping Use   Vaping status: Never Used  Substance and Sexual Activity   Alcohol use: No    Alcohol/week: 0.0 standard drinks of alcohol   Drug use: No   Sexual activity: Not Currently    Birth control/protection: Surgical  Other Topics Concern   Not on file  Social History Narrative   Lives at home with mom and sister,. Has a walker for ambulation/uses WC when necessary   Right Handed   Drinks no caffeine   Pt not working    Teacher, early years/pre Strain: Low Risk  (07/24/2023)   Received from [facility]   Overall Financial Resource Strain (CARDIA)    Difficulty of Paying Living Expenses: Not hard at all  Food Insecurity: No Food Insecurity (07/24/2023)   Received from [facility]   Hunger Vital Sign    Within the past 12 months, you worried that your food would run out before you got the money to buy more.: Never true    Within the past 12 months, the food you bought just didn't last and you didn't have money to get more.: Never true  Transportation Needs: No Transportation  Needs (07/24/2023)   Received from [facility] - Transportation    In the past 12 months, has lack of transportation kept you from medical appointments or from getting medications?: No    Lack of Transportation  (Non-Medical): No  Physical Activity: Not on file  Stress: Not on file  Social Connections: Not on file  Intimate Partner Violence: Not At Risk (07/09/2023)   Humiliation, Afraid, Rape, and Kick questionnaire    Fear of Current or Ex-Partner: No    Emotionally Abused: No    Physically Abused: No    Sexually Abused: No     PHYSICAL EXAM  Vitals:   12/17/23 1457  BP: 119/72  Pulse: 69  SpO2: 99%     There is no height or weight on file to calculate BMI.   General: The patient is well-developed and well-nourished and in no acute distress  HEENT:  Head is [place]/AT.  Sclera are anicteric.    Skin: Extremities are without rash or  edema.  Musculoskeletal:  Back is nontender  Neurologic Exam  Mental status: The patient is alert and oriented x 3 at the time of the examination. The patient has apparent normal recent and remote memory, with an apparently normal attention span and concentration ability.   Speech is normal.  Cranial nerves: Extraocular movements are full. Facial strength and sensation are normal.  No obvious hearing deficits are noted.  Motor:  Muscle bulk is normal.   Tone is increased in the legs, right slightly more than left but better than last visit.  Strength was 2-/5 in the hip flexors and 2+ right and 3 left/5 in the knee extensors,  Knee flexors were 4-/5, the distal legs are 3-4/5.  Strength was 5/5 in the arms except 4+/5 left ulnar innervated hand muscles  Sensory: Sensory testing showed mildly reduced touch and vibration sensation in the right compared to the left..  Coordination: Cerebellar testing reveals good finger-nose-finger.  Cannot do Heel-to-shin due to strengtth .  Gait and station: Due to weakness, gait not tested  Reflexes: Deep tendon reflexes are symmetric and normal in arms and increased in legs.  Clonus in right ankle is positional (leg flexed/ankle extended) and with reflex testing.       DIAGNOSTIC DATA (LABS, IMAGING, TESTING) - I  reviewed patient records, labs, notes, testing and imaging myself where available.  Lab Results  Component Value Date   WBC 5.7 09/05/2023   HGB 13.6 09/05/2023   HCT 41.8 09/05/2023   MCV 83 09/05/2023   PLT 248 09/05/2023      Component Value Date/Time   NA 138 07/08/2023 0620   NA 143 03/03/2021 1128   NA 139 02/15/2013 1700   K 3.8 07/08/2023 0620   K 3.8 02/15/2013 1700   CL 108 07/08/2023 0620   CL 109 (H) 02/15/2013 1700   CO2 22 07/08/2023 0620   CO2 25 02/15/2013 1700   GLUCOSE 117 (H) 07/08/2023 0620   GLUCOSE 94 02/15/2013 1700   BUN 31 (H) 07/08/2023 0620   BUN 21 03/03/2021 1128   BUN 20 (H) 02/15/2013 1700   CREATININE 0.71 07/08/2023 0620   CREATININE 0.59 (L) 02/15/2013 1700   CALCIUM  8.8 (L) 07/08/2023 0620   CALCIUM  9.0 02/15/2013 1700   PROT 7.1 07/08/2023 0620   PROT 6.5 03/03/2021 1128   PROT 7.2 02/15/2013 1700   ALBUMIN 3.6 07/08/2023 0620   ALBUMIN 4.5 03/03/2021 1128   ALBUMIN 3.6 02/15/2013 1700  AST 50 (H) 07/08/2023 0620   AST 20 02/15/2013 1700   ALT 99 (H) 07/08/2023 0620   ALT 15 02/15/2013 1700   ALKPHOS 153 (H) 07/08/2023 0620   ALKPHOS 127 02/15/2013 1700   BILITOT 1.1 07/08/2023 0620   BILITOT 0.7 03/03/2021 1128   BILITOT 0.3 02/15/2013 1700   GFRNONAA >60 07/08/2023 0620   GFRNONAA >60 02/15/2013 1700   GFRAA 110 05/13/2020 1146   GFRAA >60 02/15/2013 1700   Lab Results  Component Value Date   CHOL 187 11/29/2016   HDL 55 11/29/2016   LDLCALC 119 (H) 11/29/2016   TRIG 64 11/29/2016   CHOLHDL 3.4 11/29/2016   Lab Results  Component Value Date   HGBA1C 5.1 11/29/2016   No results found for: VITAMINB12 Lab Results  Component Value Date   TSH 0.412 11/26/2020       ASSESSMENT AND PLAN  Multiple sclerosis (HCC) - Plan: CBC with Differential/Platelet, Hepatic function panel  High risk medication use - Plan: CBC with Differential/Platelet, Hepatic function panel  Spasticity  Weakness of both lower  extremities  Gait disturbance    Although she tolerated the actual Mavenclad tablets, she has noted more clonus since switching from Mayzent  to Mavenclad.  I do not think that this is related to the switch.  She has not had recent infection so I am not sure why her spasticity/clonus worsened.  I do recommend that she continue with physical therapy.    We will check lab work.  If the lymphocyte count is 0.2 or lower consider adding Valtrex for shingles prophylaxis. Continue PT to help with recovery from recent fracture/deconditioning.    She would medically benefit from a standing frame to help leg strength and conditioning.     She will hopefully be able to get one.    increase baclofen  to 20 mg 4 times a day.  This can be increased further if tolerated.   Follow-up in 6 months or sooner if there are new or worsening neurologic symptoms.   This visit is part of a comprehensive longitudinal care medical relationship regarding the patients primary diagnosis of MS and related concerns.   Adleigh Mcmasters A. Vear, MD, [facility] 12/17/2023, 4:03 PM Certified in Neurology, Clinical Neurophysiology, Sleep Medicine and Neuroimaging  [facility] [address] [place], [place] [address] [phone]

## 2023-12-18 ENCOUNTER — Ambulatory Visit: Payer: Self-pay | Admitting: Neurology

## 2023-12-18 ENCOUNTER — Ambulatory Visit: Payer: PPO

## 2023-12-18 ENCOUNTER — Ambulatory Visit

## 2023-12-18 DIAGNOSIS — R278 Other lack of coordination: Secondary | ICD-10-CM

## 2023-12-18 DIAGNOSIS — R2689 Other abnormalities of gait and mobility: Secondary | ICD-10-CM

## 2023-12-18 DIAGNOSIS — M6281 Muscle weakness (generalized): Secondary | ICD-10-CM

## 2023-12-18 DIAGNOSIS — R262 Difficulty in walking, not elsewhere classified: Secondary | ICD-10-CM

## 2023-12-18 DIAGNOSIS — R2681 Unsteadiness on feet: Secondary | ICD-10-CM

## 2023-12-18 DIAGNOSIS — G35 Multiple sclerosis: Secondary | ICD-10-CM

## 2023-12-18 DIAGNOSIS — R269 Unspecified abnormalities of gait and mobility: Secondary | ICD-10-CM

## 2023-12-18 LAB — CBC WITH DIFFERENTIAL/PLATELET
Basophils Absolute: 0 x10E3/uL (ref 0.0–0.2)
Basos: 1 %
EOS (ABSOLUTE): 0.1 x10E3/uL (ref 0.0–0.4)
Eos: 2 %
Hematocrit: 35.3 % (ref 34.0–46.6)
Hemoglobin: 11.9 g/dL (ref 11.1–15.9)
Immature Grans (Abs): 0 x10E3/uL (ref 0.0–0.1)
Immature Granulocytes: 0 %
Lymphocytes Absolute: 0.6 x10E3/uL — ABNORMAL LOW (ref 0.7–3.1)
Lymphs: 12 %
MCH: 28.7 pg (ref 26.6–33.0)
MCHC: 33.7 g/dL (ref 31.5–35.7)
MCV: 85 fL (ref 79–97)
Monocytes Absolute: 0.8 x10E3/uL (ref 0.1–0.9)
Monocytes: 16 %
Neutrophils Absolute: 3.7 x10E3/uL (ref 1.4–7.0)
Neutrophils: 69 %
Platelets: 185 x10E3/uL (ref 150–450)
RBC: 4.15 x10E6/uL (ref 3.77–5.28)
RDW: 15.3 % (ref 11.7–15.4)
WBC: 5.2 x10E3/uL (ref 3.4–10.8)

## 2023-12-18 LAB — HEPATIC FUNCTION PANEL
ALT: 30 IU/L (ref 0–32)
AST: 23 IU/L (ref 0–40)
Albumin: 3.8 g/dL — ABNORMAL LOW (ref 3.9–4.9)
Alkaline Phosphatase: 152 IU/L — ABNORMAL HIGH (ref 44–121)
Bilirubin Total: 0.2 mg/dL (ref 0.0–1.2)
Bilirubin, Direct: 0.11 mg/dL (ref 0.00–0.40)
Total Protein: 6.5 g/dL (ref 6.0–8.5)

## 2023-12-18 NOTE — Therapy (Signed)
 OUTPATIENT PHYSICAL THERAPY NEURO TREATMENT   Patient Name: Lisa Williams MRN: [id number] DOB:Dec 24, 1960, 63 y.o., female Today's Date: 12/18/2023   PCP: Lenon Na  REFERRING PROVIDER: Lenon Na  END OF SESSION:  PT End of Session - 12/18/23 1018     Visit Number 43    Number of Visits 47    Date for PT Re-Evaluation 12/27/23   corrected   Progress Note Due on Visit 50    PT Start Time 1017    PT Stop Time 1058    PT Time Calculation (min) 41 min    Equipment Utilized During Treatment Gait belt    Activity Tolerance Patient tolerated treatment well;No increased pain    Behavior During Therapy [facility] for tasks assessed/performed             Past Medical History:  Diagnosis Date   Abdominal pain, right upper quadrant    Back pain    Calculus of kidney 12/09/2013   Chronic back pain    unspecified   Chronic left shoulder pain 07/19/2015   Complication of anesthesia    Functional disorder of bladder    other   Galactorrhea 11/26/2014   Chronic    Hereditary and idiopathic neuropathy 08/19/2013   History of kidney stones    HPV test positive    Hypercholesteremia 08/19/2013   Hypertension    Incomplete bladder emptying    Microscopic hematuria    MS (multiple sclerosis) (HCC)    Muscle spasticity 05/21/2014   Nonspecific findings on examination of urine    other   Osteopenia    PONV (postoperative nausea and vomiting)    Status post laparoscopic supracervical hysterectomy 11/26/2014   Tobacco user 11/26/2014   Wrist fracture    Past Surgical History:  Procedure Laterality Date   bilateral tubal ligation  1996   BREAST CYST EXCISION Left 2002   CYST EXCISION Left 05/10/2022   Procedure: CYST REMOVAL;  Surgeon: Rodolph Romano, MD;  Location: [facility] ORS;  Service: General;  Laterality: Left;   FRACTURE SURGERY     KNEE SURGERY     right   LAPAROSCOPIC SUPRACERVICAL HYSTERECTOMY  08/05/2013   ORIF WRIST FRACTURE Left  01/17/2017   Procedure: OPEN REDUCTION INTERNAL FIXATION (ORIF) WRIST FRACTURE;  Surgeon: Leora Lynwood SAUNDERS, MD;  Location: [facility] ORS;  Service: Orthopedics;  Laterality: Left;   RADIOLOGY WITH ANESTHESIA N/A 03/18/2020   Procedure: MRI WITH ANESTHESIA CERVICAL SPINE AND BRAIN  WITH AND WITHOUT CONTRAST;  Surgeon: Radiologist, Medication, MD;  Location: [facility] OR;  Service: Radiology;  Laterality: N/A;   TUBAL LIGATION Bilateral    VAGINAL HYSTERECTOMY  03/2006   Patient Active Problem List   Diagnosis Date Noted   Abnormal LFTs (liver function tests) 09/17/2023   Sinus tachycardia 07/07/2023   Aortic atherosclerosis (HCC) 01/18/2023   Prediabetes 01/18/2023   Hypokalemia 07/29/2022   Weakness of both lower extremities 07/29/2022   Abscess of left groin 11/15/2021   Abnormal LFTs 11/15/2021   Acute respiratory disease due to COVID-19 virus 11/21/2020   Weakness    Hypoalbuminemia due to protein-calorie malnutrition [facility])    Neurogenic bowel    Neurogenic bladder    Labile blood pressure    Neuropathic pain    Multiple sclerosis, relapsing-remitting (HCC) 07/09/2019   Abscess of female pelvis    SVT (supraventricular tachycardia) (HCC)    Radial styloid tenosynovitis 03/12/2018   Wheelchair dependence 02/27/2018   Localized osteoporosis with current pathological fracture with routine healing 01/19/2017  Wrist fracture 01/16/2017   Sprain of ankle 03/23/2016   Closed fracture of lateral malleolus 03/16/2016   Health care maintenance 01/24/2016   Blood pressure elevated without history of HTN 10/25/2015   Essential hypertension 10/25/2015   Multiple sclerosis (HCC) 10/02/2015   Chronic left shoulder pain 07/19/2015   Multiple sclerosis exacerbation (HCC) 07/14/2015   MS (multiple sclerosis) (HCC) 11/26/2014   Increased body mass index 11/26/2014   HPV test positive 11/26/2014   Status post laparoscopic supracervical hysterectomy 11/26/2014   Galactorrhea 11/26/2014   Back ache  05/21/2014   Adiposity 05/21/2014   Disordered sleep 05/21/2014   Muscle spasticity 05/21/2014   Spasticity 05/21/2014   Calculus of kidney 12/09/2013   Renal colic 12/09/2013   Hypercholesteremia 08/19/2013   Hereditary and idiopathic neuropathy 08/19/2013   Hypercholesterolemia without hypertriglyceridemia 08/19/2013   Bladder infection, chronic 07/25/2012   Disorder of bladder function 07/25/2012   Incomplete bladder emptying 07/25/2012   Microscopic hematuria 07/25/2012   Right upper quadrant pain 07/25/2012    ONSET DATE: 1995  REFERRING DIAG: MS  THERAPY DIAG:  Muscle weakness (generalized)  Other lack of coordination  Multiple sclerosis exacerbation (HCC)  Difficulty in walking, not elsewhere classified  Unsteadiness on feet  Abnormality of gait and mobility  Other abnormalities of gait and mobility  Rationale for Evaluation and Treatment: Rehabilitation  SUBJECTIVE:                                                                                                                                                                                             SUBJECTIVE STATEMENT:  Pt reports went to Neurologist and he stated that her shakiness/clonus type reaction is not due to her MS medication. Stated going to increase her baclofen  dosage to see if may help.    Pt accompanied by: self  PERTINENT HISTORY:  Patient is returning to PT s/p hospitalization.  s/p ORIF of R tibia shaft fracture 09/23/2022. Patient has weakness in BLE with RLE>LLE. She drives with hand controls. Patient has been diagnosed with MS in 1995. PMH includes: back pain, CBP, chronic L shoulder pain, galactorrhea, neuropathy, HPV, hypercholestermeia, HTN, MS, osteopenia, PONV, wrist fracture. Additional order for other closed fracture of proximal end of R tibia with routine healing. Still has to use [employer].   PAIN:  Are you having pain? Occasional pain in RLE; primarily in knee  PRECAUTIONS:  Fall  RED FLAGS: None   WEIGHT BEARING RESTRICTIONS: No  FALLS: Has patient fallen in last 6 months? No  LIVING ENVIRONMENT: Lives with: lives with their family Lives in: House/apartment Stairs: ramp Has following equipment at  home: Vannie - 2 wheeled, Wheelchair (manual), shower chair, and Grab bars  PLOF: Independent with household mobility with device  PATIENT GOALS: to get her independence back. To be able to get into car, toilet, and get dressed independently  OBJECTIVE:  Note: Objective measures were completed at Evaluation unless otherwise noted.  DIAGNOSTIC FINDINGS: MRI of the brain 03/18/2020 showed multiple T2/FLAIR hyperintense foci in the periventricular, juxtacortical and deep white matter.  There were no infratentorial lesions noted.  None of the foci enhanced.   Due to severe claustrophobia, the study was done with conscious sedation in the hospital   COGNITION: Overall cognitive status: Within functional limits for tasks assessed   SENSATION: Lack of sensation in feet, loss in bilateral lateral aspect of knee  COORDINATION: Does not have the strength for functional LE heel slide test   MUSCLE TONE: BLE mild tone    POSTURE: rounded shoulders, forward head, anterior pelvic tilt, and weight shift left   LOWER EXTREMITY MMT:    MMT Right Eval Left Eval  Hip flexion 0.9 1.5  Hip extension    Hip abduction 1.8 2.6  Hip adduction 3.1 1.9  Hip internal rotation    Hip external rotation    Knee flexion 0.9 1.5  Knee extension 0.8 1.2  Ankle dorsiflexion    Ankle plantarflexion    Ankle inversion    Ankle eversion    (Blank rows = not tested)  BED MOBILITY:  Assess in future session due to limited time  TRANSFERS: Assistive device utilized: Bariatric RW with sit to stands, slide board to table  Sit to stand: unable to reach full stand Stand to sit: unable to reach full stand  Chair to chair: slide board with CGA   GAIT: Unable to ambulate at  this time.   FUNCTIONAL TESTS:  Sit to stand: tricep press with BUE; unable to bring arm to walker.    Function In Sitting Test (FIST)  (1/2 femur on surface; hips/knees flexed to 90deg)   - indicate bed or mat table / step stool if used  SCORING KEY: 4 = Independent (completes task independently & successfully) 3 = Verbal Cues/Increased Time (completes task independently & successfully and only needs more time/cues) 2 = Upper Extremity Support (must use UE for support or assistance to complete successfully) 1 = Needs Assistance (unable to complete w/o physical assist; DOCUMENT LEVEL: min, mod, max) 0 = Dependent (requires complete physical assist; unable to complete successfully even w/ physical assist)  Randomly Administer Once Throughout Exam  4 - Anterior Nudge (superior sternum)  4 - Posterior Nudge (between scapular spines)  4 - Lateral Nudge (to dominant side at acromion)     4 - Static sitting (30 seconds)  4 - Sitting, shake 'no' (left and right)  4 - Sitting, eyes closed (30 seconds)   0 - Sitting, lift foot (dominant side, lift foot 1 inch twice)    2 - Pick up object from behind (object at midline, hands breadth posterior)  3 - Forward reach (use dominant arm, must complete full motion) 2 - Lateral reach (use dominant arm, clear opposite ischial tuberosity) 2 - Pick up object from floor (from between feet)   2 - Posterior scooting (move backwards 2 inches)  2 - Anterior scooting (move forward 2 inches)  2 - Lateral scooting (move to dominant side 2 inches)    TOTAL = 39/56  Notes/comments: slide board tranfer to/from table   MCD > 5 points MCID for IP  REHAB > 6 points  Function In Sitting Test (FIST) 08/14/23 (1/2 femur on surface; hips/knees flexed to 90deg)   - indicate bed or mat table / step stool if used  SCORING KEY: 4 = Independent (completes task independently & successfully) 3 = Verbal Cues/Increased Time (completes task independently &  successfully and only needs more time/cues) 2 = Upper Extremity Support (must use UE for support or assistance to complete successfully) 1 = Needs Assistance (unable to complete w/o physical assist; DOCUMENT LEVEL: min, mod, max) 0 = Dependent (requires complete physical assist; unable to complete successfully even w/ physical assist)  Randomly Administer Once Throughout Exam  4 - Anterior Nudge (superior sternum)  4 - Posterior Nudge (between scapular spines)  4 - Lateral Nudge (to dominant side at acromion)     4 - Static sitting (30 seconds)  4 - Sitting, shake 'no' (left and right)  4 - Sitting, eyes closed (30 seconds)   0 - Sitting, lift foot (dominant side, lift foot 1 inch twice)    4 - Pick up object from behind (object at midline, hands breadth posterior)  4 - Forward reach (use dominant arm, must complete full motion) 4 - Lateral reach (use dominant arm, clear opposite ischial tuberosity)  - Pick up object from floor (from between feet)   3 - Posterior scooting (move backwards 2 inches)  3 - Anterior scooting (move forward 2 inches)  3 - Lateral scooting (move to dominant side 2 inches)    TOTAL = 47/56  MCD > 5 points MCID for IP REHAB > 6 points                                                                                                                              TREATMENT DATE: 12/13/23   TherAct:  In // bars with gait belt and min A:  STS attempts x 5 total -  with bilateral UE support on // bars- CGA to Mod A to stand  with some LE trembling that subsided with prolonged standing. Min A with use of gait belt and VC to stand tall. Overall improved eccentric control with only Verbal reminders and CGA to sit today.   Static stand with erect posture- VC for gluteal contraction and to look ahead (approx 1-3 min each trial) - x 3 trials- All with only CGA once in standing position. LE trembling decreased with standing and patient was able to focus on standing  erect and even some TKE on RLE upon command today.    *Patient still unable to take any steps  with attempted ambulation in // bars but was able to remain in standing to practice static standing.     TE:  Seated quad sets (attempting 3 sec hold) with LE propped up on green chair- Minimal quad contraction- performed 15 reps each LE Passive hamstring stretch- several min each LE and patient was initially very tight and then loosened up with  increase time.  Upon completion she stated she was actually feeling pretty good with her legs stating she had not been stretching them very much and they needed that.    Thorough rest breaks provided during session to prevent fatigue       PATIENT EDUCATION: Education details: Pt educated throughout session about proper posture and technique with exercises. Improved exercise technique, movement at target joints, use of target muscles after min to mod verbal, visual, tactile cues.  Person educated: Patient Education method: Explanation, Demonstration, Tactile cues, and Verbal cues Education comprehension: verbalized understanding, returned demonstration, verbal cues required, tactile cues required, and needs further education  HOME EXERCISE PROGRAM: Stand 3x/day in stander   GOALS: Goals reviewed with patient? Yes  SHORT TERM GOALS: Target date: 08/08/2023  Patient will be independent in home exercise program to improve strength/mobility for better functional independence with ADLs.  Baseline: 4/8: compliance Goal status: MET    LONG TERM GOALS: Target date: 12/27/2023  Patient will tolerate five consecutive stands with UE support from wheelchair to standing to improve functional mobility with additional cushion and RW.  Baseline: unable to perform 4/8:unable to perform full stand 5/20: perform next session 5/29: two full stands with CGA and cushion on her seat 7/31: able to stand 3 times from [facility] With no addition cushion  in parallel bars.   Goal status: Ongoing   2.  Patient will ambulate 10 ft with bRW with wheelchair follow.  Baseline:  unable to ambulate 4/8: unable to ambulate 5/20: able to ambulate length of // bars in previous session 5/27: ambulate 2.5 lengths of // bars with two seated rest breaks with min A for weight shift and wheelchair follow  7/31: able to ambulate in parallel bars 3 ft with min assist and +2 follow in WC> difficulty advancing the LLE on this day.  Goal status: Ongoing   3.  Patient will improve FIST score >6 points to demonstrate improved stability and ability to perform ADLs.  Baseline:  3/6: 39/56 4/8: 47/ 56 5/20: 48/56 7/31:46/56 Goal status: MET  4.  Patient will perform toileting with mod I at home for improved independence.  Baseline: 3/6: requires assistance 4/8: requires dependence on machine 5/20: requires assistance with use of wipe buddy.  7/31: continues to require use of sara steady and wipe buddy for safety and hygiene Goal status: ongoing     ASSESSMENT:  CLINICAL IMPRESSION:  Patient presented with less overall LE trembling today and able to stand longer. Still unable to take any functional steps in // bars but much less trembling and much improved overall eccentric control with sitting today. She presented with minimal quad activation in long sitting in w/c today but benefited from prolonged hamstring stretch reporting her legs felt much better post session.   Pt will continue to benefit from skilled therapy to address remaining deficits in order to improve overall QoL and return to PLOF.       OBJECTIVE IMPAIRMENTS: Abnormal gait, cardiopulmonary status limiting activity, decreased activity tolerance, decreased balance, decreased coordination, decreased endurance, decreased mobility, difficulty walking, decreased ROM, decreased strength, hypomobility, increased fascial restrictions, impaired perceived functional ability, impaired flexibility, impaired sensation, improper  body mechanics, and postural dysfunction.   ACTIVITY LIMITATIONS: carrying, lifting, bending, sitting, standing, squatting, sleeping, stairs, transfers, bed mobility, continence, bathing, toileting, dressing, self feeding, reach over head, hygiene/grooming, locomotion level, and caring for others  PARTICIPATION LIMITATIONS: meal prep, cleaning, laundry, interpersonal relationship, driving, shopping, community activity, and church  PERSONAL  FACTORS: Age, Past/current experiences, Time since onset of injury/illness/exacerbation, Transportation, and 3+ comorbidities: ack pain, CBP, chronic L shoulder pain, galactorrhea, neuropathy, HPV, hypercholestermeia, HTN, MS, osteopenia, PONV, wrist fracture are also affecting patient's functional outcome.   REHAB POTENTIAL: Good  CLINICAL DECISION MAKING: Evolving/moderate complexity  EVALUATION COMPLEXITY: Moderate  PLAN:  PT FREQUENCY: 2x/week  PT DURATION: 12 weeks  PLANNED INTERVENTIONS: 97164- PT Re-evaluation, 97110-Therapeutic exercises, 97530- Therapeutic activity, 97112- Neuromuscular re-education, 97535- Self Care, [id number]- Manual therapy, [phone]- Gait training, [phone]- Orthotic Fit/training, [phone]- Canalith repositioning, [id number]- Aquatic Therapy, [phone]- Electrical stimulation (unattended), [phone]- Electrical stimulation (manual), [id number]- Ultrasound, [id number]- Traction (mechanical), Patient/Family education, Balance training, Stair training, Taping, Dry Needling, Joint mobilization, Joint manipulation, Spinal manipulation, Spinal mobilization, Scar mobilization, Compression bandaging, Vestibular training, Visual/preceptual remediation/compensation, Cognitive remediation, DME instructions, Cryotherapy, Moist heat, and Biofeedback  PLAN FOR NEXT SESSION:   sit to stands; try supported walking, continue dynamic core stabilization training and seated reaching tasks to improve weight shifting.    Chyrl London, PT  Physical Therapist - [facility]  11:24 AM 12/18/23

## 2023-12-20 ENCOUNTER — Ambulatory Visit: Payer: PPO | Admitting: Physical Therapy

## 2023-12-20 ENCOUNTER — Ambulatory Visit

## 2023-12-20 DIAGNOSIS — M6281 Muscle weakness (generalized): Secondary | ICD-10-CM

## 2023-12-20 DIAGNOSIS — R2681 Unsteadiness on feet: Secondary | ICD-10-CM

## 2023-12-20 DIAGNOSIS — G35 Multiple sclerosis: Secondary | ICD-10-CM

## 2023-12-20 DIAGNOSIS — R269 Unspecified abnormalities of gait and mobility: Secondary | ICD-10-CM

## 2023-12-20 DIAGNOSIS — R278 Other lack of coordination: Secondary | ICD-10-CM

## 2023-12-20 DIAGNOSIS — R2689 Other abnormalities of gait and mobility: Secondary | ICD-10-CM

## 2023-12-20 DIAGNOSIS — R262 Difficulty in walking, not elsewhere classified: Secondary | ICD-10-CM

## 2023-12-20 NOTE — Therapy (Signed)
 OUTPATIENT PHYSICAL THERAPY NEURO TREATMENT   Patient Name: Lisa Gitto Williams MRN: [id number] DOB:17-Nov-1960, 63 y.o., female Today's Date: 12/20/2023   PCP: Lenon Na  REFERRING PROVIDER: Lenon Na  END OF SESSION:  PT End of Session - 12/20/23 1014     Visit Number 44    Number of Visits 47    Date for PT Re-Evaluation 12/27/23   corrected   Progress Note Due on Visit 50    PT Start Time 1017    PT Stop Time 1057    PT Time Calculation (min) 40 min    Equipment Utilized During Treatment Gait belt    Activity Tolerance Patient tolerated treatment well;No increased pain    Behavior During Therapy [facility] for tasks assessed/performed             Past Medical History:  Diagnosis Date   Abdominal pain, right upper quadrant    Back pain    Calculus of kidney 12/09/2013   Chronic back pain    unspecified   Chronic left shoulder pain 07/19/2015   Complication of anesthesia    Functional disorder of bladder    other   Galactorrhea 11/26/2014   Chronic    Hereditary and idiopathic neuropathy 08/19/2013   History of kidney stones    HPV test positive    Hypercholesteremia 08/19/2013   Hypertension    Incomplete bladder emptying    Microscopic hematuria    MS (multiple sclerosis) (HCC)    Muscle spasticity 05/21/2014   Nonspecific findings on examination of urine    other   Osteopenia    PONV (postoperative nausea and vomiting)    Status post laparoscopic supracervical hysterectomy 11/26/2014   Tobacco user 11/26/2014   Wrist fracture    Past Surgical History:  Procedure Laterality Date   bilateral tubal ligation  1996   BREAST CYST EXCISION Left 2002   CYST EXCISION Left 05/10/2022   Procedure: CYST REMOVAL;  Surgeon: Rodolph Romano, MD;  Location: [facility] OR[facility];  Service: General;  Laterality: Left;   FRACTURE SURGERY     KNEE SURGERY     right   LAPAROSCOPIC SUPRACERVICAL HYSTERECTOMY  08/05/2013   ORIF WRIST FRACTURE Left  01/17/2017   Procedure: OPEN REDUCTION INTERNAL FIXATION (ORIF) WRIST FRACTURE;  Surgeon: Leora Lynwood SAUNDERS, MD;  Location: [facility] OR[facility];  Service: Orthopedics;  Laterality: Left;   RADIOLOGY WITH ANESTHESIA N/A 03/18/2020   Procedure: MRI WITH ANESTHESIA CERVICAL SPINE AND BRAIN  WITH AND WITHOUT CONTRAST;  Surgeon: Radiologist, Medication, MD;  Location: [facility] OR;  Service: Radiology;  Laterality: N/A;   TUBAL LIGATION Bilateral    VAGINAL HYSTERECTOMY  03/2006   Patient Active Problem List   Diagnosis Date Noted   Abnormal LFTs (liver function tests) 09/17/2023   Sinus tachycardia 07/07/2023   Aortic atherosclerosis (HCC) 01/18/2023   Prediabetes 01/18/2023   Hypokalemia 07/29/2022   Weakness of both lower extremities 07/29/2022   Abscess of left groin 11/15/2021   Abnormal LFTs 11/15/2021   Acute respiratory disease due to COVID-19 virus 11/21/2020   Weakness    Hypoalbuminemia due to protein-calorie malnutrition [facility])    Neurogenic bowel    Neurogenic bladder    Labile blood pressure    Neuropathic pain    Multiple sclerosis, relapsing-remitting (HCC) 07/09/2019   Abscess of female pelvis    SVT (supraventricular tachycardia) (HCC)    Radial styloid tenosynovitis 03/12/2018   Wheelchair dependence 02/27/2018   Localized osteoporosis with current pathological fracture with routine healing 01/19/2017  Wrist fracture 01/16/2017   Sprain of ankle 03/23/2016   Closed fracture of lateral malleolus 03/16/2016   Health care maintenance 01/24/2016   Blood pressure elevated without history of HTN 10/25/2015   Essential hypertension 10/25/2015   Multiple sclerosis (HCC) 10/02/2015   Chronic left shoulder pain 07/19/2015   Multiple sclerosis exacerbation (HCC) 07/14/2015   MS (multiple sclerosis) (HCC) 11/26/2014   Increased body mass index 11/26/2014   HPV test positive 11/26/2014   Status post laparoscopic supracervical hysterectomy 11/26/2014   Galactorrhea 11/26/2014   Back ache  05/21/2014   Adiposity 05/21/2014   Disordered sleep 05/21/2014   Muscle spasticity 05/21/2014   Spasticity 05/21/2014   Calculus of kidney 12/09/2013   Renal colic 12/09/2013   Hypercholesteremia 08/19/2013   Hereditary and idiopathic neuropathy 08/19/2013   Hypercholesterolemia without hypertriglyceridemia 08/19/2013   Bladder infection, chronic 07/25/2012   Disorder of bladder function 07/25/2012   Incomplete bladder emptying 07/25/2012   Microscopic hematuria 07/25/2012   Right upper quadrant pain 07/25/2012    ONSET DATE: 1995  REFERRING DIAG: MS  THERAPY DIAG:  Muscle weakness (generalized)  Other lack of coordination  Multiple sclerosis exacerbation (HCC)  Difficulty in walking, not elsewhere classified  Unsteadiness on feet  Abnormality of gait and mobility  Other abnormalities of gait and mobility  Rationale for Evaluation and Treatment: Rehabilitation  SUBJECTIVE:                                                                                                                                                                                             SUBJECTIVE STATEMENT:  Pt reports that RLE clonus has gotten a little better since medication adjustment. Reports moderate back pain today, but does not rate. No other updates.    Pt accompanied by: self  PERTINENT HISTORY:  Patient is returning to PT s/p hospitalization.  s/p ORIF of R tibia shaft fracture 09/23/2022. Patient has weakness in BLE with RLE>LLE. She drives with hand controls. Patient has been diagnosed with MS in 1995. PMH includes: back pain, CBP, chronic L shoulder pain, galactorrhea, neuropathy, HPV, hypercholestermeia, HTN, MS, osteopenia, PONV, wrist fracture. Additional order for other closed fracture of proximal end of R tibia with routine healing. Still has to use [employer].   PAIN:  Are you having pain? Occasional pain in RLE; primarily in knee  PRECAUTIONS: Fall  RED  FLAGS: None   WEIGHT BEARING RESTRICTIONS: No  FALLS: Has patient fallen in last 6 months? No  LIVING ENVIRONMENT: Lives with: lives with their family Lives in: House/apartment Stairs: ramp Has following equipment at home: Vannie - 2 wheeled, Wheelchair (manual),  shower chair, and Grab bars  PLOF: Independent with household mobility with device  PATIENT GOALS: to get her independence back. To be able to get into car, toilet, and get dressed independently  OBJECTIVE:  Note: Objective measures were completed at Evaluation unless otherwise noted.  DIAGNOSTIC FINDINGS: MRI of the brain 03/18/2020 showed multiple T2/FLAIR hyperintense foci in the periventricular, juxtacortical and deep white matter.  There were no infratentorial lesions noted.  None of the foci enhanced.   Due to severe claustrophobia, the study was done with conscious sedation in the hospital   COGNITION: Overall cognitive status: Within functional limits for tasks assessed   SENSATION: Lack of sensation in feet, loss in bilateral lateral aspect of knee  COORDINATION: Does not have the strength for functional LE heel slide test   MUSCLE TONE: BLE mild tone    POSTURE: rounded shoulders, forward head, anterior pelvic tilt, and weight shift left   LOWER EXTREMITY MMT:    MMT Right Eval Left Eval  Hip flexion 0.9 1.5  Hip extension    Hip abduction 1.8 2.6  Hip adduction 3.1 1.9  Hip internal rotation    Hip external rotation    Knee flexion 0.9 1.5  Knee extension 0.8 1.2  Ankle dorsiflexion    Ankle plantarflexion    Ankle inversion    Ankle eversion    (Blank rows = not tested)  BED MOBILITY:  Assess in future session due to limited time  TRANSFERS: Assistive device utilized: Bariatric RW with sit to stands, slide board to table  Sit to stand: unable to reach full stand Stand to sit: unable to reach full stand  Chair to chair: slide board with CGA   GAIT: Unable to ambulate at this time.    FUNCTIONAL TESTS:  Sit to stand: tricep press with BUE; unable to bring arm to walker.    Function In Sitting Test (FIST)  (1/2 femur on surface; hips/knees flexed to 90deg)   - indicate bed or mat table / step stool if used  SCORING KEY: 4 = Independent (completes task independently & successfully) 3 = Verbal Cues/Increased Time (completes task independently & successfully and only needs more time/cues) 2 = Upper Extremity Support (must use UE for support or assistance to complete successfully) 1 = Needs Assistance (unable to complete w/o physical assist; DOCUMENT LEVEL: min, mod, max) 0 = Dependent (requires complete physical assist; unable to complete successfully even w/ physical assist)  Randomly Administer Once Throughout Exam  4 - Anterior Nudge (superior sternum)  4 - Posterior Nudge (between scapular spines)  4 - Lateral Nudge (to dominant side at acromion)     4 - Static sitting (30 seconds)  4 - Sitting, shake 'no' (left and right)  4 - Sitting, eyes closed (30 seconds)   0 - Sitting, lift foot (dominant side, lift foot 1 inch twice)    2 - Pick up object from behind (object at midline, hands breadth posterior)  3 - Forward reach (use dominant arm, must complete full motion) 2 - Lateral reach (use dominant arm, clear opposite ischial tuberosity) 2 - Pick up object from floor (from between feet)   2 - Posterior scooting (move backwards 2 inches)  2 - Anterior scooting (move forward 2 inches)  2 - Lateral scooting (move to dominant side 2 inches)    TOTAL = 39/56  Notes/comments: slide board tranfer to/from table   MCD > 5 points MCID for IP REHAB > 6 points  Function In  Sitting Test (FIST) 08/14/23 (1/2 femur on surface; hips/knees flexed to 90deg)   - indicate bed or mat table / step stool if used  SCORING KEY: 4 = Independent (completes task independently & successfully) 3 = Verbal Cues/Increased Time (completes task independently & successfully and only  needs more time/cues) 2 = Upper Extremity Support (must use UE for support or assistance to complete successfully) 1 = Needs Assistance (unable to complete w/o physical assist; DOCUMENT LEVEL: min, mod, max) 0 = Dependent (requires complete physical assist; unable to complete successfully even w/ physical assist)  Randomly Administer Once Throughout Exam  4 - Anterior Nudge (superior sternum)  4 - Posterior Nudge (between scapular spines)  4 - Lateral Nudge (to dominant side at acromion)     4 - Static sitting (30 seconds)  4 - Sitting, shake 'no' (left and right)  4 - Sitting, eyes closed (30 seconds)   0 - Sitting, lift foot (dominant side, lift foot 1 inch twice)    4 - Pick up object from behind (object at midline, hands breadth posterior)  4 - Forward reach (use dominant arm, must complete full motion) 4 - Lateral reach (use dominant arm, clear opposite ischial tuberosity)  - Pick up object from floor (from between feet)   3 - Posterior scooting (move backwards 2 inches)  3 - Anterior scooting (move forward 2 inches)  3 - Lateral scooting (move to dominant side 2 inches)    TOTAL = 47/56  MCD > 5 points MCID for IP REHAB > 6 points                                                                                                                              TREATMENT DATE: 12/13/23   TherAct:  In // bars with gait belt and mod A:  STS attempts x 4 total -  able to achieve full standing on 2 bouts.  with bilateral UE support on // bars- Mod A to stand.  with moderate LE trembling that subsided with prolonged standing. Min A with use of gait belt and VC to stand tall. Reduced eccentric control  to sitting on both attempts today from full standing.   Weight shift R and L x 15 bil.  With BUE support on rails.  Attempted to perform step with the RLE with weight shift over the LLE, but unable to advance the RLE due to stiffness in low back on this day.   Seated on yellow  dynadisc. With gymnastic ribbon:  Cross body reach into lateral reach, then arc to starting position x 12 bil  Large figure 8 x 21 bil.  Large circle x 12 bil.   Erect posture ball toss R, center L, x 15 total.     TE:  Seated quad sets (attempting 3 sec hold) with LE propped up on green chair- Minimal quad contraction- performed 10 reps each LE Passive hamstring stretch- several min each LE  and patient was initially very tight and then loosened up with increase time.  Upon completion she stated she was actually feeling pretty good with her legs stating she had not been stretching them very much and they needed that.  LAQ AROM x 12 able to achieve ~50 deg from full extension on the LLE and only 80 from full extension on the RLE.    Thorough rest breaks provided during session to prevent fatigue       PATIENT EDUCATION: Education details: Pt educated throughout session about proper posture and technique with exercises. Improved exercise technique, movement at target joints, use of target muscles after min to mod verbal, visual, tactile cues.  Person educated: Patient Education method: Explanation, Demonstration, Tactile cues, and Verbal cues Education comprehension: verbalized understanding, returned demonstration, verbal cues required, tactile cues required, and needs further education  HOME EXERCISE PROGRAM: Stand 3x/day in stander   GOALS: Goals reviewed with patient? Yes  SHORT TERM GOALS: Target date: 08/08/2023  Patient will be independent in home exercise program to improve strength/mobility for better functional independence with ADLs.  Baseline: 4/8: compliance Goal status: MET    LONG TERM GOALS: Target date: 12/27/2023  Patient will tolerate five consecutive stands with UE support from wheelchair to standing to improve functional mobility with additional cushion and RW.  Baseline: unable to perform 4/8:unable to perform full stand 5/20: perform next session 5/29:  two full stands with CGA and cushion on her seat 7/31: able to stand 3 times from [facility]. With no addition cushion  in parallel bars.  Goal status: Ongoing   2.  Patient will ambulate 10 ft with bRW with wheelchair follow.  Baseline:  unable to ambulate 4/8: unable to ambulate 5/20: able to ambulate length of // bars in previous session 5/27: ambulate 2.5 lengths of // bars with two seated rest breaks with min A for weight shift and wheelchair follow  7/31: able to ambulate in parallel bars 3 ft with min assist and +2 follow in WC> difficulty advancing the LLE on this day.  Goal status: Ongoing   3.  Patient will improve FIST score >6 points to demonstrate improved stability and ability to perform ADLs.  Baseline:  3/6: 39/56 4/8: 47/ 56 5/20: 48/56 7/31:46/56 Goal status: MET  4.  Patient will perform toileting with mod I at home for improved independence.  Baseline: 3/6: requires assistance 4/8: requires dependence on machine 5/20: requires assistance with use of wipe buddy.  7/31: continues to require use of sara steady and wipe buddy for safety and hygiene Goal status: ongoing     ASSESSMENT:  CLINICAL IMPRESSION:  Patient presented with increased LBP on this day. Limiting time in stand. Continued to address core and posture deficits with unstable sitting surface as well as generalized strengthening for BLE. In notes to have increased ROM with LAQ on the LLE on this day, but limited on the RLE.  Pt will continue to benefit from skilled therapy to address remaining deficits in order to improve overall QoL and return to PLOF.       OBJECTIVE IMPAIRMENTS: Abnormal gait, cardiopulmonary status limiting activity, decreased activity tolerance, decreased balance, decreased coordination, decreased endurance, decreased mobility, difficulty walking, decreased ROM, decreased strength, hypomobility, increased fascial restrictions, impaired perceived functional ability, impaired flexibility, impaired  sensation, improper body mechanics, and postural dysfunction.   ACTIVITY LIMITATIONS: carrying, lifting, bending, sitting, standing, squatting, sleeping, stairs, transfers, bed mobility, continence, bathing, toileting, dressing, self feeding, reach over head, hygiene/grooming, locomotion  level, and caring for others  PARTICIPATION LIMITATIONS: meal prep, cleaning, laundry, interpersonal relationship, driving, shopping, community activity, and church  PERSONAL FACTORS: Age, Past/current experiences, Time since onset of injury/illness/exacerbation, Transportation, and 3+ comorbidities: ack pain, CBP, chronic L shoulder pain, galactorrhea, neuropathy, HPV, hypercholestermeia, HTN, MS, osteopenia, PONV, wrist fracture are also affecting patient's functional outcome.   REHAB POTENTIAL: Good  CLINICAL DECISION MAKING: Evolving/moderate complexity  EVALUATION COMPLEXITY: Moderate  PLAN:  PT FREQUENCY: 2x/week  PT DURATION: 12 weeks  PLANNED INTERVENTIONS: 97164- PT Re-evaluation, 97110-Therapeutic exercises, 97530- Therapeutic activity, 97112- Neuromuscular re-education, 97535- Self Care, [id number]- Manual therapy, [phone]- Gait training, [phone]- Orthotic Fit/training, [phone]- Canalith repositioning, [id number]- Aquatic Therapy, [phone]- Electrical stimulation (unattended), [phone]- Electrical stimulation (manual), [id number]- Ultrasound, [id number]- Traction (mechanical), Patient/Family education, Balance training, Stair training, Taping, Dry Needling, Joint mobilization, Joint manipulation, Spinal manipulation, Spinal mobilization, Scar mobilization, Compression bandaging, Vestibular training, Visual/preceptual remediation/compensation, Cognitive remediation, DME instructions, Cryotherapy, Moist heat, and Biofeedback  PLAN FOR NEXT SESSION:   sit to stands; try supported walking, continue dynamic core stabilization training and seated reaching tasks to improve weight shifting.   Massie Dollar PT, DPT  Physical Therapist  - [place]  [facility]  7:35 AM 12/21/23

## 2023-12-20 NOTE — Therapy (Signed)
 OUTPATIENT OCCUPATIONAL THERAPY NEURO TREATMENT NOTE  Patient Name: Lisa Williams MRN: [id number] DOB:10-Sep-1960, 63 y.o., female Today's Date: 12/20/2023  PCP: Dr. Layman Williams   Neurologist Dr. Suanne at [facility] REFERRING PROVIDER: Dr. Layman Williams    END OF SESSION:  OT End of Session - 12/20/23 0947     Visit Number 29    Number of Visits 44    Date for OT Re-Evaluation 02/05/24    Authorization Time Period Reporting period beginning 09/28/23-01/14/24    OT Start Time 1101    OT Stop Time 1145    OT Time Calculation (min) 44 min    Equipment Utilized During Treatment manual wc    Activity Tolerance Patient tolerated treatment well    Behavior During Therapy [facility] for tasks assessed/performed         Past Medical History:  Diagnosis Date   Abdominal pain, right upper quadrant    Back pain    Calculus of kidney 12/09/2013   Chronic back pain    unspecified   Chronic left shoulder pain 07/19/2015   Complication of anesthesia    Functional disorder of bladder    other   Galactorrhea 11/26/2014   Chronic    Hereditary and idiopathic neuropathy 08/19/2013   History of kidney stones    HPV test positive    Hypercholesteremia 08/19/2013   Hypertension    Incomplete bladder emptying    Microscopic hematuria    MS (multiple sclerosis) (HCC)    Muscle spasticity 05/21/2014   Nonspecific findings on examination of urine    other   Osteopenia    PONV (postoperative nausea and vomiting)    Status post laparoscopic supracervical hysterectomy 11/26/2014   Tobacco user 11/26/2014   Wrist fracture    Past Surgical History:  Procedure Laterality Date   bilateral tubal ligation  1996   BREAST CYST EXCISION Left 2002   CYST EXCISION Left 05/10/2022   Procedure: CYST REMOVAL;  Surgeon: Lisa Romano, [place];  Location: [facility] ORS;  Service: General;  Laterality: Left;   FRACTURE SURGERY     KNEE SURGERY     right   LAPAROSCOPIC SUPRACERVICAL HYSTERECTOMY   08/05/2013   ORIF WRIST FRACTURE Left 01/17/2017   Procedure: OPEN REDUCTION INTERNAL FIXATION (ORIF) WRIST FRACTURE;  Surgeon: Lisa Lynwood SAUNDERS, Williams;  Location: [facility] ORS;  Service: Orthopedics;  Laterality: Left;   RADIOLOGY WITH ANESTHESIA N/A 03/18/2020   Procedure: MRI WITH ANESTHESIA CERVICAL SPINE AND BRAIN  WITH AND WITHOUT CONTRAST;  Surgeon: Lisa [facility];  Location: [facility] OR;  Service: Radiology;  Laterality: N/A;   TUBAL LIGATION Bilateral    VAGINAL HYSTERECTOMY  03/2006   Patient Active Problem List   Diagnosis Date Noted   Abnormal LFTs (liver function tests) 09/17/2023   Sinus tachycardia 07/07/2023   Aortic atherosclerosis (HCC) 01/18/2023   Prediabetes 01/18/2023   Hypokalemia 07/29/2022   Weakness of both lower extremities 07/29/2022   Abscess of left groin 11/15/2021   Abnormal LFTs 11/15/2021   Acute respiratory disease due to COVID-19 virus 11/21/2020   Weakness    Hypoalbuminemia due to protein-calorie malnutrition [facility])    Neurogenic bowel    Neurogenic bladder    Labile blood pressure    Neuropathic pain    Multiple sclerosis, relapsing-remitting (HCC) 07/09/2019   Abscess of female pelvis    SVT (supraventricular tachycardia) (HCC)    Radial styloid tenosynovitis 03/12/2018   Wheelchair dependence 02/27/2018   Localized osteoporosis with current pathological fracture with routine healing  01/19/2017   Wrist fracture 01/16/2017   Sprain of ankle 03/23/2016   Closed fracture of lateral malleolus 03/16/2016   Health care maintenance 01/24/2016   Blood pressure elevated without history of HTN 10/25/2015   Essential hypertension 10/25/2015   Multiple sclerosis (HCC) 10/02/2015   Chronic left shoulder pain 07/19/2015   Multiple sclerosis exacerbation (HCC) 07/14/2015   MS (multiple sclerosis) (HCC) 11/26/2014   Increased body mass index 11/26/2014   HPV test positive 11/26/2014   Status post laparoscopic supracervical hysterectomy 11/26/2014    Galactorrhea 11/26/2014   Back ache 05/21/2014   Adiposity 05/21/2014   Disordered sleep 05/21/2014   Muscle spasticity 05/21/2014   Spasticity 05/21/2014   Calculus of kidney 12/09/2013   Renal colic 12/09/2013   Hypercholesteremia 08/19/2013   Hereditary and idiopathic neuropathy 08/19/2013   Hypercholesterolemia without hypertriglyceridemia 08/19/2013   Bladder infection, chronic 07/25/2012   Disorder of bladder function 07/25/2012   Incomplete bladder emptying 07/25/2012   Microscopic hematuria 07/25/2012   Right upper quadrant pain 07/25/2012   ONSET DATE: 5/24 Lisa Williams and broke R leg which caused beginning of ADL decline; diagnosed with MS in 1995)  REFERRING DIAG: MS  THERAPY DIAG:  Muscle weakness (generalized)  Other lack of coordination  Multiple sclerosis exacerbation (HCC)  Rationale for Evaluation and Treatment: Rehabilitation  SUBJECTIVE:  SUBJECTIVE STATEMENT: Pt agreeable to attempt toilet transfer and clothing management today, and reports no challenges with LE clonus yet today. Pt accompanied by: self  PERTINENT HISTORY: Pt reports increased difficulty with basic self care tasks since breaking her R leg last May.  Since that time, mobility and ADLs have declined, with pt requiring assist from private caregivers and family to manage bathing, toileting, dressing, and functional transfers.  PRECAUTIONS: Fall  WEIGHT BEARING RESTRICTIONS: No  PAIN: 11/27/23: No pain Are you having pain? No; occasional pain in R lower back, but not today  FALLS: Has patient fallen in last 6 months? No Last fall was May 17 of 2024  LIVING ENVIRONMENT: Lives with: lives with their family (including mother who has dementia and sister [name])  Lives in: 2 level home but pt resides on main level Stairs: ramp  Has following equipment at home: Wheelchair (manual), Shower bench, and hand held shower shower, 1 grab bar in the shower, 3in1 commode, sit to stand lift (electric), FWW,  hospital bed  PLOF: modified indep with ADL/IADLs prior to May of 7975   PATIENT GOALS: Increase independence with basic self care tasks  OBJECTIVE:  Note: Objective measures were completed at Evaluation unless otherwise noted.  HAND DOMINANCE: Right  ADLs:  Overall ADLs: assist provided from paid caregiver and sister Transfers/ambulation related to ADLs: sit to stand lift for all transfers, set up for sliding board in/out of bed  Eating: indep  Grooming: modified indep (wc level in mom's bathroom)  UB Dressing: set up (can't access her bedroom closet)  LB Dressing: able to sit on side of bed to don all LB clothing; assist to hike pants/underwear Toileting: assist with clothing management d/t standing within electric lift Bathing: bed bath with sister helping to wash backside Tub Shower transfers: N/A; pt has walk in shower in mom's bathroom (wc does fit in this bathroom but pt reports inability to transfer from wc and requires arm rests on a bench for a successful transfer attempt from any DME).  Tub bench is in pt's bathroom, but lift does not fit through bathroom doorway and pt reports inability to transfer up from tub  bench (currently unable to manage either transfer)   Equipment: see above  IADLs: Shopping: [employer] for shopping Light housekeeping: modified indep from wc level Meal Prep: modified indep from wc level Community mobility: relies on community transportation or family members Medication management: indep Landscape architect: indep Handwriting: NT; pt denies any [facility]MC challenges  MOBILITY STATUS: Hx of falls  POSTURE COMMENTS:  Rounded shoulders, forward head, anterior pelvic tilt, and weight shift left   ACTIVITY TOLERANCE: Activity tolerance: Per PT; currently standing 30-60 sec within Light Gait harness/lift; currently non-ambulatory  UPPER EXTREMITY ROM:  BUEs WFL  UPPER EXTREMITY MMT:     MMT Right eval Left eval  Shoulder flexion 4+ 4  Shoulder  abduction 4+ 4  Shoulder adduction    Shoulder extension    Shoulder internal rotation 4+ 4+  Shoulder external rotation 4 4  Middle trapezius    Lower trapezius    Elbow flexion 4+ 4+  Elbow extension 4+ 4+  Wrist flexion 4+ 4+  Wrist extension 4+ 4+  Wrist ulnar deviation    Wrist radial deviation    Wrist pronation    Wrist supination    (Blank rows = not tested)  HAND FUNCTION/COORDINATION:  R/L WNL; pt denies any coordination deficits/ able to manipulate pills/clothing fasteners/etc without difficulty  11/15/23: *Measures taken d/t PT note revealing pt with increased difficulty manipulating smaller ADL supplies during reaching activities in PT session   11/15/23: Grip strength: R: 70 lbs, L: 63 lbs Pinch strength: Lateral: R: 16 lbs, L: 15 lbs; 3 point pinch: R: 12 lbs, L: 13 lbs 9 hole peg test: Right: 25 sec, L 49 sec   SENSATION: WFL  EDEMA: No visible edema in BUEs  MUSCLE TONE: BUEs WNL  COGNITION: Overall cognitive status: Within functional limits for tasks assessed  VISION: wears glasses all the time, no reports of diplopia  PERCEPTION: Not tested  PRAXIS: WFL  OBSERVATIONS:  Pt pleasant, cooperative, and motivated to work towards improving indep with ADLs.                                                                                                     TREATMENT DATE: 12/18/23  Pt reports she is doing well, got a good workout in PT today.  She would like to continue to work on core strengthening and hand coordination skills.    Therapeutic Activity: Pt seen for dynamic reaching activity from seated position with use of SAEBO multi level tower and looped balls.  Pt able to demonstrate ability to reach all 4 levels with cues for weight shifting to achieve highest level, performed bilaterally with more difficulty on the left than right.  Pt seen for reaching for resistive pinch pins and placing onto small dowel presented in a variety of planes of motion to  encourage dynamic reaching with weight shift and core activation.  Pt performing manipulation of small, med and large washers and placing on small dowels in diagonal planes of motion.  Occasional cues for prehension patterns as well as working on translatory skills of the hand and  using the hand for storage.  Pt engaging in task requiring manipulation of checker sized objects, coordination to place into slot while also performing reach with item in elevated plane of motion.  Also worked towards moving items through the hand and using the hand for storage.  Occasional cues for prehension patterns.     PATIENT EDUCATION: Education details: toilet transfer Person educated: Patient Education method: Explanation, vc Education comprehension: verbalized understanding, demonstrated understanding, further training needed  HOME EXERCISE PROGRAM: IR A/AAROM to promote shoulder flexibility for peri care/bathing  GOALS: Goals reviewed with patient? Yes  SHORT TERM GOALS: Target date: 12/25/23  Pt will perform bed bath with set up only. Baseline: Eval: Min-mod A for posterior washing; 09/28/23: Min A for posterior washing; 11/13/23: Able to manage bed bath but sister helps with thoroughness, per pt's preference; pt in agreement to trial bath sitting EOB or in wc before next OT session. Goal status: achieved/d/c  2.  Pt will utilize sliding board for wc<>drop arm commode transfer with SBA. Baseline: Eval: Currently using electric lift for transfer to [facility]; 09/28/23: Not yet completed at home but transfers to commode have been easier without sliding board d/t pt implementing a scoot pivot. Goal status: d/c (pt does not prefer sliding board for commode transfer)  3.  Pt will be indep to perform HEP for maintaining BUE strength for ADLs and functional transfers. Baseline: Eval: HEP not yet initiated; 09/28/23: Pt is working on core stability exercises from wc, cane stretches for bilat shoulder IR, and tricep  extension via wc pushups; 11/13/23: indep Goal status: achieved  LONG TERM GOALS: Target date: 02/05/24  Pt will perform sink bath with set up A. (Revised on 11/13/23 from min A to set up) Baseline: Eval: Currently bed bathing d/t inability to stand at sink without electric lift (lift does not fit into pt's bathroom); 09/28/23: still completing bed bath as pt is not yet able to stand; 11/13/23: Not yet attempted; pt encouraged to attempt before next OT session now that bed bath goal has been met Goal status: ongoing   2.  Pt will perform wc<>tub bench transfer with min A. Baseline: Eval: Unable; 09/28/23: Performed in OT clinic with min A, but not yet in the home Goal status: d/c (pt prefers sink bath)  3.  Pt will perform squat pivot transfer wc<>BSC with SBA.  Baseline: Eval: Eval: Currently using electric lift for transfer to [facility]; 09/28/23: Not yet completed in clinic with [facility], and using lift for [facility] in the home still; 11/13/23: Still using Camie lift at home and in clinic transfers are all scoot rather than squat pivots d/t LE weakness. Goal status: ongoing  4. Pt will perform scoot transfer wc<>toilet using grab bars with supv to allow toileting in community setting.  Baseline: Eval: Pt limits community outings d/t requiring use of lift for [facility] transfers; 09/28/23: Pt has performed 2 successful scoot pivot  Transfers to toilet in OT clinic with min A, extra time.  Further trials with clothing management on toilet needed, but pt can lower and hike  pants while sititng edge of mat via lateral leaning and min A to maintain core stability; 11/13/23: Pt performs with CGA and extra time.    Goal status: ongoing  5.  Pt will complete seated clothing management with min A to enable voiding in handicapped stall within community. Baseline: Recert 11/13/23: Pt can transfer to toilet with increased time and effort, but has not yet attempted clothing management or voiding  while seated on commode in community  setting.  Pt can lower pants to knees while seated in wc, but requires mod A to hike over hips from seated position.   6.  Pt will tolerate standing x1 min with close supv and BUE support to manage clothing in prep for toileting. Baseline: Eval: Currently standing in electric lift only, or within parallel bars without lift; 09/28/23: Not yet attempted; 11/13/23: Pt is able to perform static standing with heavy BUE support on parallel bars with PT Goal status: in progress  7.  Pt will increase bilat grip strength by (TBD) in order to securely grasp walker sufficiently for functional transfers and ambulation.  Baseline: Recert 11/13/23: TBD (PT note read following OT session, indicating pt with difficulty securing items in hand)  Goal status: New  8.  Pt will increase bilat [facility] skills in order to manipulate small ADL supplies with reduced dropping as indicated by (TBD) improvement in 9 hole peg test. Baseline: Recert 11/13/23: 9 hole peg test TBD (PT note read following OT session, indicating pt with difficulty manipulating and securing items in hands)  Goal status: New  ASSESSMENT: CLINICAL IMPRESSION: Pt continues to demonstrate progress in all areas.  She remains highly motivated and responds well to cues and direction for multidirectional reaching and working towards increased core activation and strengthening.  Difficulty at times with reach outside of base of support and works hard to shift weight and attempt extending reaching patterns.  She requires occasional rest breaks with tasks.  Difficulty with picking up dropped items from the floor but able to demonstrate use of reacher to assist when needed.  She has a Sports administrator at home to use when this occurs.  Pt will continue to benefit from skilled OT to work towards above noted goals in OT poc, working to maximize indep with daily tasks while reducing burden of care on caregivers.   PERFORMANCE DEFICITS: in functional skills including ADLs, IADLs,  strength, pain, flexibility, Gross motor control, mobility, balance, body mechanics, endurance, and decreased knowledge of use of DME, and psychosocial skills including coping strategies, environmental adaptation, habits, and routines and behaviors.   IMPAIRMENTS: are limiting patient from ADLs, IADLs, and social participation.   CO-MORBIDITIES: has co-morbidities such as neuropathy, back pain, obesity, MS, HTN that affects occupational performance. Patient will benefit from skilled OT to address above impairments and improve overall function.  MODIFICATION OR ASSISTANCE TO COMPLETE EVALUATION: No modification of tasks or assist necessary to complete an evaluation.  OT OCCUPATIONAL PROFILE AND HISTORY: Detailed assessment: Review of records and additional review of physical, cognitive, psychosocial history related to current functional performance.  CLINICAL DECISION MAKING: Moderate - several treatment options, min-mod task modification necessary  REHAB POTENTIAL: Good  EVALUATION COMPLEXITY: Moderate   PLAN:  OT FREQUENCY: 2x/week  OT DURATION: 12 weeks  PLANNED INTERVENTIONS: 97168 OT Re-evaluation, 97535 self care/ADL training, 02889 therapeutic exercise, 97530 therapeutic activity, 97112 neuromuscular re-education, 97140 manual therapy, 97116 gait training, 02989 moist heat, 97010 cryotherapy, balance training, functional mobility training, psychosocial skills training, energy conservation, coping strategies training, patient/family education, and DME and/or AE instructions  RECOMMENDED OTHER SERVICES: None at this time (Pt currently receiving PT services in this clinic)  CONSULTED AND AGREED WITH PLAN OF CARE: Patient  PLAN FOR NEXT SESSION: see above  Jacky Dross T Sanjiv Castorena, OTR/L, CLT  Ola Raap, OT 12/20/2023, 9:48 AM

## 2023-12-22 NOTE — Therapy (Signed)
 OUTPATIENT OCCUPATIONAL THERAPY NEURO PROGRESS AND TREATMENT NOTE Reporting period beginning 11/13/23-12/20/23  Patient Name: Lisa Williams MRN: [id number] DOB:02-Dec-1960, 63 y.o., female Today's Date: 12/22/2023  PCP: Dr. Layman Piety   Neurologist Dr. Suanne at [facility] REFERRING PROVIDER: Dr. Layman Piety    END OF SESSION:  OT End of Session - 12/22/23 2221     Visit Number 30    Number of Visits 44    Date for OT Re-Evaluation 02/05/24    Authorization Time Period Reporting period beginning 11/13/23-12/20/23    Progress Note Due on Visit 40    OT Start Time 1100    OT Stop Time 1143    OT Time Calculation (min) 43 min    Equipment Utilized During Treatment manual wc    Activity Tolerance Patient tolerated treatment well    Behavior During Therapy [facility] for tasks assessed/performed         Past Medical History:  Diagnosis Date   Abdominal pain, right upper quadrant    Back pain    Calculus of kidney 12/09/2013   Chronic back pain    unspecified   Chronic left shoulder pain 07/19/2015   Complication of anesthesia    Functional disorder of bladder    other   Galactorrhea 11/26/2014   Chronic    Hereditary and idiopathic neuropathy 08/19/2013   History of kidney stones    HPV test positive    Hypercholesteremia 08/19/2013   Hypertension    Incomplete bladder emptying    Microscopic hematuria    MS (multiple sclerosis) (HCC)    Muscle spasticity 05/21/2014   Nonspecific findings on examination of urine    other   Osteopenia    PONV (postoperative nausea and vomiting)    Status post laparoscopic supracervical hysterectomy 11/26/2014   Tobacco user 11/26/2014   Wrist fracture    Past Surgical History:  Procedure Laterality Date   bilateral tubal ligation  1996   BREAST CYST EXCISION Left 2002   CYST EXCISION Left 05/10/2022   Procedure: CYST REMOVAL;  Surgeon: Rodolph Romano, MD;  Location: [facility] ORS;  Service: General;  Laterality: Left;    FRACTURE SURGERY     KNEE SURGERY     right   LAPAROSCOPIC SUPRACERVICAL HYSTERECTOMY  08/05/2013   ORIF WRIST FRACTURE Left 01/17/2017   Procedure: OPEN REDUCTION INTERNAL FIXATION (ORIF) WRIST FRACTURE;  Surgeon: Leora Lynwood SAUNDERS, MD;  Location: [facility] ORS;  Service: Orthopedics;  Laterality: Left;   RADIOLOGY WITH ANESTHESIA N/A 03/18/2020   Procedure: MRI WITH ANESTHESIA CERVICAL SPINE AND BRAIN  WITH AND WITHOUT CONTRAST;  Surgeon: Radiologist, Medication, MD;  Location: [facility] OR;  Service: Radiology;  Laterality: N/A;   TUBAL LIGATION Bilateral    VAGINAL HYSTERECTOMY  03/2006   Patient Active Problem List   Diagnosis Date Noted   Abnormal LFTs (liver function tests) 09/17/2023   Sinus tachycardia 07/07/2023   Aortic atherosclerosis (HCC) 01/18/2023   Prediabetes 01/18/2023   Hypokalemia 07/29/2022   Weakness of both lower extremities 07/29/2022   Abscess of left groin 11/15/2021   Abnormal LFTs 11/15/2021   Acute respiratory disease due to COVID-19 virus 11/21/2020   Weakness    Hypoalbuminemia due to protein-calorie malnutrition [facility])    Neurogenic bowel    Neurogenic bladder    Labile blood pressure    Neuropathic pain    Multiple sclerosis, relapsing-remitting (HCC) 07/09/2019   Abscess of female pelvis    SVT (supraventricular tachycardia) (HCC)    Radial styloid tenosynovitis 03/12/2018  Wheelchair dependence 02/27/2018   Localized osteoporosis with current pathological fracture with routine healing 01/19/2017   Wrist fracture 01/16/2017   Sprain of ankle 03/23/2016   Closed fracture of lateral malleolus 03/16/2016   Health care maintenance 01/24/2016   Blood pressure elevated without history of HTN 10/25/2015   Essential hypertension 10/25/2015   Multiple sclerosis (HCC) 10/02/2015   Chronic left shoulder pain 07/19/2015   Multiple sclerosis exacerbation (HCC) 07/14/2015   MS (multiple sclerosis) (HCC) 11/26/2014   Increased body mass index 11/26/2014   HPV test  positive 11/26/2014   Status post laparoscopic supracervical hysterectomy 11/26/2014   Galactorrhea 11/26/2014   Back ache 05/21/2014   Adiposity 05/21/2014   Disordered sleep 05/21/2014   Muscle spasticity 05/21/2014   Spasticity 05/21/2014   Calculus of kidney 12/09/2013   Renal colic 12/09/2013   Hypercholesteremia 08/19/2013   Hereditary and idiopathic neuropathy 08/19/2013   Hypercholesterolemia without hypertriglyceridemia 08/19/2013   Bladder infection, chronic 07/25/2012   Disorder of bladder function 07/25/2012   Incomplete bladder emptying 07/25/2012   Microscopic hematuria 07/25/2012   Right upper quadrant pain 07/25/2012   ONSET DATE: 5/24 Amon and broke R leg which caused beginning of ADL decline; diagnosed with MS in 1995)  REFERRING DIAG: MS  THERAPY DIAG:  Muscle weakness (generalized)  Other lack of coordination  Multiple sclerosis exacerbation (HCC)  Rationale for Evaluation and Treatment: Rehabilitation  SUBJECTIVE:  SUBJECTIVE STATEMENT: Pt reports increased back pain today and wants to avoid transfers. Pt accompanied by: self  PERTINENT HISTORY: Pt reports increased difficulty with basic self care tasks since breaking her R leg last May.  Since that time, mobility and ADLs have declined, with pt requiring assist from private caregivers and family to manage bathing, toileting, dressing, and functional transfers.  PRECAUTIONS: Fall  WEIGHT BEARING RESTRICTIONS: No  PAIN: 12/20/23: 7/10 pain in back; pt reports she took a Meloxicam  this morning but is still quite sore Are you having pain? No; occasional pain in R lower back, but not today  FALLS: Has patient fallen in last 6 months? No Last fall was May 17 of 2024  LIVING ENVIRONMENT: Lives with: lives with their family (including mother who has dementia and sister [name])  Lives in: 2 level home but pt resides on main level Stairs: ramp  Has following equipment at home: Wheelchair (manual),  Shower bench, and hand held shower shower, 1 grab bar in the shower, 3in1 commode, sit to stand lift (electric), FWW, hospital bed  PLOF: modified indep with ADL/IADLs prior to May of 7975   PATIENT GOALS: Increase independence with basic self care tasks  OBJECTIVE:  Note: Objective measures were completed at Evaluation unless otherwise noted.  HAND DOMINANCE: Right  ADLs:  Overall ADLs: assist provided from paid caregiver and sister Transfers/ambulation related to ADLs: sit to stand lift for all transfers, set up for sliding board in/out of bed  Eating: indep  Grooming: modified indep (wc level in mom's bathroom)  UB Dressing: set up (can't access her bedroom closet)  LB Dressing: able to sit on side of bed to don all LB clothing; assist to hike pants/underwear Toileting: assist with clothing management d/t standing within electric lift Bathing: bed bath with sister helping to wash backside Tub Shower transfers: N/A; pt has walk in shower in mom's bathroom (wc does fit in this bathroom but pt reports inability to transfer from wc and requires arm rests on a bench for a successful transfer attempt from any DME).  Tub bench  is in pt's bathroom, but lift does not fit through bathroom doorway and pt reports inability to transfer up from tub bench (currently unable to manage either transfer)   Equipment: see above  IADLs: Shopping: [employer] for shopping Light housekeeping: modified indep from wc level Meal Prep: modified indep from wc level Community mobility: relies on community transportation or family members Medication management: indep Landscape architect: indep Handwriting: NT; pt denies any FMC challenges  MOBILITY STATUS: Hx of falls  POSTURE COMMENTS:  Rounded shoulders, forward head, anterior pelvic tilt, and weight shift left   ACTIVITY TOLERANCE: Activity tolerance: Per PT; currently standing 30-60 sec within Light Gait harness/lift; currently  non-ambulatory  UPPER EXTREMITY ROM:  BUEs WFL  UPPER EXTREMITY MMT:     MMT Right eval Left eval  Shoulder flexion 4+ 4  Shoulder abduction 4+ 4  Shoulder adduction    Shoulder extension    Shoulder internal rotation 4+ 4+  Shoulder external rotation 4 4  Middle trapezius    Lower trapezius    Elbow flexion 4+ 4+  Elbow extension 4+ 4+  Wrist flexion 4+ 4+  Wrist extension 4+ 4+  Wrist ulnar deviation    Wrist radial deviation    Wrist pronation    Wrist supination    (Blank rows = not tested)  HAND FUNCTION/COORDINATION:  R/L WNL; pt denies any coordination deficits/ able to manipulate pills/clothing fasteners/etc without difficulty  11/15/23: *Measures taken d/t PT note revealing pt with increased difficulty manipulating smaller ADL supplies during reaching activities in PT session   11/15/23: Grip strength: R: 70 lbs, L: 63 lbs Pinch strength: Lateral: R: 16 lbs, L: 15 lbs; 3 point pinch: R: 12 lbs, L: 13 lbs 9 hole peg test: Right: 25 sec, L 49 sec   SENSATION: WFL  EDEMA: No visible edema in BUEs  MUSCLE TONE: BUEs WNL  COGNITION: Overall cognitive status: Within functional limits for tasks assessed  VISION: wears glasses all the time, no reports of diplopia  PERCEPTION: Not tested  PRAXIS: WFL  OBSERVATIONS:  Pt pleasant, cooperative, and motivated to work towards improving indep with ADLs.                                                                                                     TREATMENT DATE: 12/20/23 Therapeutic Activity: -L FMC training:  -Placement of grooved pegs into pegboard, removing 1 by 1, storing up to 3 in hand, and discarding 1 by 1 -Various size washer pick up from magnetic dish, threading over a vertical dowel.  Practiced storing up to 3 washers in palm, and discarding 1 by 1 from palm to radial side of hand.  Therapeutic Exercise: -Core strengthening/stability exercises with large therapy ball while seated in wc/arm rests  removed: 3 sets 5 reps each   -Forward reach with hands on ball rolling forward and back -Trunk lateral rotations R/L with ball held at chest level  -bilat shoulder flexion lifting ball chest level>overhead    PATIENT EDUCATION: Education details: L hand FMC, core stability exercises Person educated: Patient Education method: Explanation,  vc, demo Education comprehension: verbalized understanding, demonstrated understanding, further training needed  HOME EXERCISE PROGRAM: IR A/AAROM to promote shoulder flexibility for peri care/bathing  GOALS: Goals reviewed with patient? Yes  SHORT TERM GOALS: Target date: 12/25/23  Pt will perform bed bath with set up only. Baseline: Eval: Min-mod A for posterior washing; 09/28/23: Min A for posterior washing; 11/13/23: Able to manage bed bath but sister helps with thoroughness, per pt's preference; pt in agreement to trial bath sitting EOB or in wc before next OT session. Goal status: achieved/d/c  2.  Pt will utilize sliding board for wc<>drop arm commode transfer with SBA. Baseline: Eval: Currently using electric lift for transfer to [facility]; 09/28/23: Not yet completed at home but transfers to commode have been easier without sliding board d/t pt implementing a scoot pivot. Goal status: d/c (pt does not prefer sliding board for commode transfer)  3.  Pt will be indep to perform HEP for maintaining BUE strength for ADLs and functional transfers. Baseline: Eval: HEP not yet initiated; 09/28/23: Pt is working on core stability exercises from wc, cane stretches for bilat shoulder IR, and tricep extension via wc pushups; 11/13/23: indep Goal status: achieved  LONG TERM GOALS: Target date: 02/05/24  Pt will perform sink bath with set up A. (Revised on 11/13/23 from min A to set up) Baseline: Eval: Currently bed bathing d/t inability to stand at sink without electric lift (lift does not fit into pt's bathroom); 09/28/23: still completing bed bath as pt is not yet  able to stand; 11/13/23: Not yet attempted; pt encouraged to attempt before next OT session now that bed bath goal has been met; 12/20/23: Pt is now completing sponge bath with min A while seated EOB (caregiver assists with washing peri area d/t no bed rail to aid in R lateral lean) Goal status: ongoing   2.  Pt will perform wc<>tub bench transfer with min A. Baseline: Eval: Unable; 09/28/23: Performed in OT clinic with min A, but not yet in the home Goal status: d/c (pt prefers sink bath)  3.  Pt will perform squat pivot transfer wc<>BSC with SBA.  Baseline: Eval: Eval: Currently using electric lift for transfer to [facility]; 09/28/23: Not yet completed in clinic with [facility], and using lift for [facility] in the home still; 11/13/23: Still using Camie lift at home and in clinic transfers are all scoot rather than squat pivots d/t LE weakness; 12/20/23: Pt reports that she plans to use her Camie lift until she can ambulate to her [facility] d/t limited space for wc in her desired location for [facility] at home. Goal status: d/c  4. Pt will perform scoot transfer wc<>toilet using grab bars with supv to allow toileting in community setting.  Baseline: Eval: Pt limits community outings d/t requiring use of lift for [facility] transfers; 09/28/23: Pt has performed 2 successful scoot pivot  Transfers to toilet in OT clinic with min A, extra time.  Further trials with clothing management on toilet needed, but pt can lower and hike  pants while sititng edge of mat via lateral leaning and min A to maintain core stability; 11/13/23: Pt performs with CGA and extra time; 12/19/23: Pt   performs with close supv on most attempts, occasional CGA; efficiency is improving  Goal status: ongoing  5.  Pt will complete seated clothing management with min A to enable voiding in handicapped stall within community. Baseline: Recert 11/13/23: Pt can transfer to toilet with increased time and effort, but has not  yet attempted clothing management or voiding while seated  on commode in community setting.  Pt can lower pants to knees while seated in wc, but requires mod A to hike over hips from seated position; 12/20/23: Pt fully lowers panties and shorts while seated on commode with supv, can hike clothing to her her upper thighs, transfers back to wc, and then performs a tricep extension from wc level to allow caregiver to hike clothing remainder of the way for purposes of EC (min A for clothing management component) Goal status: achieved  6.  Pt will tolerate standing x1 min with close supv and BUE support to manage clothing in prep for toileting. Baseline: Eval: Currently standing in electric lift only, or within parallel bars without lift; 09/28/23: Not yet attempted; 11/13/23: Pt is able to perform static standing with heavy BUE support on parallel bars with PT; 12/19/22: Not attempted recently d/t new intermittent clonus in BLEs  Goal status: in progress  7.  Pt will increase bilat grip strength by (TBD) in order to securely grasp walker sufficiently for functional transfers and ambulation.  Baseline: Recert 11/13/23: TBD (PT note read following OT session, indicating pt with difficulty securing items in hand); 12/20/23:   Goal status: in progress  8.  Pt will increase bilat [facility] skills in order to manipulate small ADL supplies with reduced dropping as indicated by (TBD) improvement in 9 hole peg test. Baseline: Recert 11/13/23: 9 hole peg test TBD (PT note read following OT session, indicating pt with difficulty manipulating and securing items in hands); 12/20/23:   Goal status: in progress  ASSESSMENT: CLINICAL IMPRESSION: Progress update completed today. Pt continues to demonstrate progress in all areas.  Pt is now managing clothing for toileting in community setting with min A.  OT has recommended min A be ongoing from caregiver for purposes of EC after toileting/transfer are completed, at least while this task continues to be performed to and from wc level rather  than ambulating/standing transfer.  Pt has progressed from supine bed bath to sitting EOB, requiring assist only to complete peri hygiene.  OT has recommended wc be placed at bedside to provide R lateral support for lateral weight shifts, while L lateral weight shift is supported by bed rail, in order to further increase indep with peri hygiene while sponge bathing.  Pt remains highly motivated and responds well to cues and direction for multidirectional reaching and working towards increased core activation and strengthening.  Continued difficulty to reach outside base of support at times and works hard to shift weight and attempt extended reaching patterns.  She requires occasional rest breaks with tasks.  [facility] improving, though still demonstrates impairment, and more pronounced on the L, with difficulty repositioning small items within the L hand fingertips and performing translatory movements within palm without dropping items from her hand.  Pt will continue to benefit from skilled OT to work towards above noted goals in OT poc, working to maximize indep with daily tasks while reducing burden of care on caregivers.   PERFORMANCE DEFICITS: in functional skills including ADLs, IADLs, strength, pain, flexibility, Gross motor control, mobility, balance, body mechanics, endurance, and decreased knowledge of use of DME, and psychosocial skills including coping strategies, environmental adaptation, habits, and routines and behaviors.   IMPAIRMENTS: are limiting patient from ADLs, IADLs, and social participation.   CO-MORBIDITIES: has co-morbidities such as neuropathy, back pain, obesity, MS, HTN that affects occupational performance. Patient will benefit from skilled OT to address above impairments and  improve overall function.  MODIFICATION OR ASSISTANCE TO COMPLETE EVALUATION: No modification of tasks or assist necessary to complete an evaluation.  OT OCCUPATIONAL PROFILE AND HISTORY: Detailed assessment:  Review of records and additional review of physical, cognitive, psychosocial history related to current functional performance.  CLINICAL DECISION MAKING: Moderate - several treatment options, min-mod task modification necessary  REHAB POTENTIAL: Good  EVALUATION COMPLEXITY: Moderate  PLAN:  OT FREQUENCY: 2x/week  OT DURATION: 12 weeks  PLANNED INTERVENTIONS: 97168 OT Re-evaluation, 97535 self care/ADL training, 02889 therapeutic exercise, 97530 therapeutic activity, 97112 neuromuscular re-education, 97140 manual therapy, 97116 gait training, 02989 moist heat, 97010 cryotherapy, balance training, functional mobility training, psychosocial skills training, energy conservation, coping strategies training, patient/family education, and DME and/or AE instructions  RECOMMENDED OTHER SERVICES: None at this time (Pt currently receiving PT services in this clinic)  CONSULTED AND AGREED WITH PLAN OF CARE: Patient  PLAN FOR NEXT SESSION: see above  Inocente Blazing, MS, OTR/L  Inocente MARLA Blazing, OT 12/22/2023, 10:25 PM

## 2023-12-25 ENCOUNTER — Ambulatory Visit

## 2023-12-25 ENCOUNTER — Ambulatory Visit: Payer: PPO

## 2023-12-25 DIAGNOSIS — R269 Unspecified abnormalities of gait and mobility: Secondary | ICD-10-CM

## 2023-12-25 DIAGNOSIS — M6281 Muscle weakness (generalized): Secondary | ICD-10-CM

## 2023-12-25 DIAGNOSIS — G35 Multiple sclerosis: Secondary | ICD-10-CM

## 2023-12-25 DIAGNOSIS — R278 Other lack of coordination: Secondary | ICD-10-CM

## 2023-12-25 DIAGNOSIS — R2681 Unsteadiness on feet: Secondary | ICD-10-CM

## 2023-12-25 DIAGNOSIS — R262 Difficulty in walking, not elsewhere classified: Secondary | ICD-10-CM

## 2023-12-25 NOTE — Therapy (Signed)
 OUTPATIENT PHYSICAL THERAPY NEURO TREATMENT   Patient Name: Lisa Williams Grissom MRN: [id number] DOB:1961/02/24, 63 y.o., female Today's Date: 12/26/2023   PCP: Lenon Na  REFERRING PROVIDER: Lenon Na  END OF SESSION:  PT End of Session - 12/25/23 1410     Visit Number 45    Number of Visits 47    Date for PT Re-Evaluation 12/27/23   corrected   Progress Note Due on Visit 50    PT Start Time 1147    PT Stop Time 1228    PT Time Calculation (min) 41 min    Equipment Utilized During Treatment Gait belt    Activity Tolerance Patient tolerated treatment well;No increased pain    Behavior During Therapy [facility] for tasks assessed/performed              Past Medical History:  Diagnosis Date   Abdominal pain, right upper quadrant    Back pain    Calculus of kidney 12/09/2013   Chronic back pain    unspecified   Chronic left shoulder pain 07/19/2015   Complication of anesthesia    Functional disorder of bladder    other   Galactorrhea 11/26/2014   Chronic    Hereditary and idiopathic neuropathy 08/19/2013   History of kidney stones    HPV test positive    Hypercholesteremia 08/19/2013   Hypertension    Incomplete bladder emptying    Microscopic hematuria    MS (multiple sclerosis) (HCC)    Muscle spasticity 05/21/2014   Nonspecific findings on examination of urine    other   Osteopenia    PONV (postoperative nausea and vomiting)    Status post laparoscopic supracervical hysterectomy 11/26/2014   Tobacco user 11/26/2014   Wrist fracture    Past Surgical History:  Procedure Laterality Date   bilateral tubal ligation  1996   BREAST CYST EXCISION Left 2002   CYST EXCISION Left 05/10/2022   Procedure: CYST REMOVAL;  Surgeon: Rodolph Romano, MD;  Location: [facility] ORS;  Service: General;  Laterality: Left;   FRACTURE SURGERY     KNEE SURGERY     right   LAPAROSCOPIC SUPRACERVICAL HYSTERECTOMY  08/05/2013   ORIF WRIST FRACTURE Left  01/17/2017   Procedure: OPEN REDUCTION INTERNAL FIXATION (ORIF) WRIST FRACTURE;  Surgeon: Leora Lynwood SAUNDERS, MD;  Location: [facility] OR[facility];  Service: Orthopedics;  Laterality: Left;   RADIOLOGY WITH ANESTHESIA N/A 03/18/2020   Procedure: MRI WITH ANESTHESIA CERVICAL SPINE AND BRAIN  WITH AND WITHOUT CONTRAST;  Surgeon: Radiologist, Medication, MD;  Location: [facility] OR;  Service: Radiology;  Laterality: N/A;   TUBAL LIGATION Bilateral    VAGINAL HYSTERECTOMY  03/2006   Patient Active Problem List   Diagnosis Date Noted   Abnormal LFTs (liver function tests) 09/17/2023   Sinus tachycardia 07/07/2023   Aortic atherosclerosis (HCC) 01/18/2023   Prediabetes 01/18/2023   Hypokalemia 07/29/2022   Weakness of both lower extremities 07/29/2022   Abscess of left groin 11/15/2021   Abnormal LFTs 11/15/2021   Acute respiratory disease due to COVID-19 virus 11/21/2020   Weakness    Hypoalbuminemia due to protein-calorie malnutrition [facility])    Neurogenic bowel    Neurogenic bladder    Labile blood pressure    Neuropathic pain    Multiple sclerosis, relapsing-remitting (HCC) 07/09/2019   Abscess of female pelvis    SVT (supraventricular tachycardia) (HCC)    Radial styloid tenosynovitis 03/12/2018   Wheelchair dependence 02/27/2018   Localized osteoporosis with current pathological fracture with routine healing 01/19/2017  Wrist fracture 01/16/2017   Sprain of ankle 03/23/2016   Closed fracture of lateral malleolus 03/16/2016   Health care maintenance 01/24/2016   Blood pressure elevated without history of HTN 10/25/2015   Essential hypertension 10/25/2015   Multiple sclerosis (HCC) 10/02/2015   Chronic left shoulder pain 07/19/2015   Multiple sclerosis exacerbation (HCC) 07/14/2015   MS (multiple sclerosis) (HCC) 11/26/2014   Increased body mass index 11/26/2014   HPV test positive 11/26/2014   Status post laparoscopic supracervical hysterectomy 11/26/2014   Galactorrhea 11/26/2014   Back ache  05/21/2014   Adiposity 05/21/2014   Disordered sleep 05/21/2014   Muscle spasticity 05/21/2014   Spasticity 05/21/2014   Calculus of kidney 12/09/2013   Renal colic 12/09/2013   Hypercholesteremia 08/19/2013   Hereditary and idiopathic neuropathy 08/19/2013   Hypercholesterolemia without hypertriglyceridemia 08/19/2013   Bladder infection, chronic 07/25/2012   Disorder of bladder function 07/25/2012   Incomplete bladder emptying 07/25/2012   Microscopic hematuria 07/25/2012   Right upper quadrant pain 07/25/2012    ONSET DATE: 1995  REFERRING DIAG: MS  THERAPY DIAG:  Muscle weakness (generalized)  Other lack of coordination  Multiple sclerosis exacerbation (HCC)  Difficulty in walking, not elsewhere classified  Unsteadiness on feet  Abnormality of gait and mobility  Rationale for Evaluation and Treatment: Rehabilitation  SUBJECTIVE:                                                                                                                                                                                             SUBJECTIVE STATEMENT:  Pt reports back pain is better and feeling pretty good overall today. States ready to try demo of standing frame next week.   Pt accompanied by: self  PERTINENT HISTORY:  Patient is returning to PT s/p hospitalization.  s/p ORIF of R tibia shaft fracture 09/23/2022. Patient has weakness in BLE with RLE>LLE. She drives with hand controls. Patient has been diagnosed with MS in 1995. PMH includes: back pain, CBP, chronic L shoulder pain, galactorrhea, neuropathy, HPV, hypercholestermeia, HTN, MS, osteopenia, PONV, wrist fracture. Additional order for other closed fracture of proximal end of R tibia with routine healing. Still has to use [employer].   PAIN:  Are you having pain? Occasional pain in RLE; primarily in knee  PRECAUTIONS: Fall  RED FLAGS: None   WEIGHT BEARING RESTRICTIONS: No  FALLS: Has patient fallen in last 6  months? No  LIVING ENVIRONMENT: Lives with: lives with their family Lives in: House/apartment Stairs: ramp Has following equipment at home: Vannie - 2 wheeled, Wheelchair (manual), shower chair, and Grab bars  PLOF: Independent with household mobility  with device  PATIENT GOALS: to get her independence back. To be able to get into car, toilet, and get dressed independently  OBJECTIVE:  Note: Objective measures were completed at Evaluation unless otherwise noted.  DIAGNOSTIC FINDINGS: MRI of the brain 03/18/2020 showed multiple T2/FLAIR hyperintense foci in the periventricular, juxtacortical and deep white matter.  There were no infratentorial lesions noted.  None of the foci enhanced.   Due to severe claustrophobia, the study was done with conscious sedation in the hospital   COGNITION: Overall cognitive status: Within functional limits for tasks assessed   SENSATION: Lack of sensation in feet, loss in bilateral lateral aspect of knee  COORDINATION: Does not have the strength for functional LE heel slide test   MUSCLE TONE: BLE mild tone    POSTURE: rounded shoulders, forward head, anterior pelvic tilt, and weight shift left   LOWER EXTREMITY MMT:    MMT Right Eval Left Eval  Hip flexion 0.9 1.5  Hip extension    Hip abduction 1.8 2.6  Hip adduction 3.1 1.9  Hip internal rotation    Hip external rotation    Knee flexion 0.9 1.5  Knee extension 0.8 1.2  Ankle dorsiflexion    Ankle plantarflexion    Ankle inversion    Ankle eversion    (Blank rows = not tested)  BED MOBILITY:  Assess in future session due to limited time  TRANSFERS: Assistive device utilized: Bariatric RW with sit to stands, slide board to table  Sit to stand: unable to reach full stand Stand to sit: unable to reach full stand  Chair to chair: slide board with CGA   GAIT: Unable to ambulate at this time.   FUNCTIONAL TESTS:  Sit to stand: tricep press with BUE; unable to bring arm to  walker.    Function In Sitting Test (FIST)  (1/2 femur on surface; hips/knees flexed to 90deg)   - indicate bed or mat table / step stool if used  SCORING KEY: 4 = Independent (completes task independently & successfully) 3 = Verbal Cues/Increased Time (completes task independently & successfully and only needs more time/cues) 2 = Upper Extremity Support (must use UE for support or assistance to complete successfully) 1 = Needs Assistance (unable to complete w/o physical assist; DOCUMENT LEVEL: min, mod, max) 0 = Dependent (requires complete physical assist; unable to complete successfully even w/ physical assist)  Randomly Administer Once Throughout Exam  4 - Anterior Nudge (superior sternum)  4 - Posterior Nudge (between scapular spines)  4 - Lateral Nudge (to dominant side at acromion)     4 - Static sitting (30 seconds)  4 - Sitting, shake 'no' (left and right)  4 - Sitting, eyes closed (30 seconds)   0 - Sitting, lift foot (dominant side, lift foot 1 inch twice)    2 - Pick up object from behind (object at midline, hands breadth posterior)  3 - Forward reach (use dominant arm, must complete full motion) 2 - Lateral reach (use dominant arm, clear opposite ischial tuberosity) 2 - Pick up object from floor (from between feet)   2 - Posterior scooting (move backwards 2 inches)  2 - Anterior scooting (move forward 2 inches)  2 - Lateral scooting (move to dominant side 2 inches)    TOTAL = 39/56  Notes/comments: slide board tranfer to/from table   MCD > 5 points MCID for IP REHAB > 6 points  Function In Sitting Test (FIST) 08/14/23 (1/2 femur on surface; hips/knees flexed to  90deg)   - indicate bed or mat table / step stool if used  SCORING KEY: 4 = Independent (completes task independently & successfully) 3 = Verbal Cues/Increased Time (completes task independently & successfully and only needs more time/cues) 2 = Upper Extremity Support (must use UE for support or  assistance to complete successfully) 1 = Needs Assistance (unable to complete w/o physical assist; DOCUMENT LEVEL: min, mod, max) 0 = Dependent (requires complete physical assist; unable to complete successfully even w/ physical assist)  Randomly Administer Once Throughout Exam  4 - Anterior Nudge (superior sternum)  4 - Posterior Nudge (between scapular spines)  4 - Lateral Nudge (to dominant side at acromion)     4 - Static sitting (30 seconds)  4 - Sitting, shake 'no' (left and right)  4 - Sitting, eyes closed (30 seconds)   0 - Sitting, lift foot (dominant side, lift foot 1 inch twice)    4 - Pick up object from behind (object at midline, hands breadth posterior)  4 - Forward reach (use dominant arm, must complete full motion) 4 - Lateral reach (use dominant arm, clear opposite ischial tuberosity)  - Pick up object from floor (from between feet)   3 - Posterior scooting (move backwards 2 inches)  3 - Anterior scooting (move forward 2 inches)  3 - Lateral scooting (move to dominant side 2 inches)    TOTAL = 47/56  MCD > 5 points MCID for IP REHAB > 6 points                                                                                                                              TREATMENT DATE: 12/25/23   TherAct:  In // bars with gait belt:  STS x 7 total- Able to stand with min-mod A assist. Continued LE trembling upon standing but none with sitting- PT did help with eccentric stand to sit today.     Weight shift R and L x 15 bil.  With BUE support on rails.  Perform step with the RLE with weight shift over the LLE, but unable to advance the RLE due to stiffness in low back on this day.   Static standing in // bars with Min A to maintain standing, use of gait belt, and w/c positioned directly behind patient.  1) 47 sec 2)  1 min 11 sec 3) 2 min 35 sec 4) 3 min 18 sec 5) 2 min 39 sec   VC throughout for posture- tighten gluteals, promote tall posture, and on  last trial- patient was able to take 1 UE off rail briefly   Thorough rest breaks provided during session to prevent fatigue       PATIENT EDUCATION: Education details: Pt educated throughout session about proper posture and technique with exercises. Improved exercise technique, movement at target joints, use of target muscles after min to mod verbal, visual, tactile cues.  Person educated: Patient Education method: Explanation,  Demonstration, Tactile cues, and Verbal cues Education comprehension: verbalized understanding, returned demonstration, verbal cues required, tactile cues required, and needs further education  HOME EXERCISE PROGRAM: Stand 3x/day in stander   GOALS: Goals reviewed with patient? Yes  SHORT TERM GOALS: Target date: 08/08/2023  Patient will be independent in home exercise program to improve strength/mobility for better functional independence with ADLs.  Baseline: 4/8: compliance Goal status: MET    LONG TERM GOALS: Target date: 12/27/2023  Patient will tolerate five consecutive stands with UE support from wheelchair to standing to improve functional mobility with additional cushion and RW.  Baseline: unable to perform 4/8:unable to perform full stand 5/20: perform next session 5/29: two full stands with CGA and cushion on her seat 7/31: able to stand 3 times from [facility]. With no addition cushion  in parallel bars.  Goal status: Ongoing   2.  Patient will ambulate 10 ft with bRW with wheelchair follow.  Baseline:  unable to ambulate 4/8: unable to ambulate 5/20: able to ambulate length of // bars in previous session 5/27: ambulate 2.5 lengths of // bars with two seated rest breaks with min A for weight shift and wheelchair follow  7/31: able to ambulate in parallel bars 3 ft with min assist and +2 follow in WC> difficulty advancing the LLE on this day.  Goal status: Ongoing   3.  Patient will improve FIST score >6 points to demonstrate improved stability and  ability to perform ADLs.  Baseline:  3/6: 39/56 4/8: 47/ 56 5/20: 48/56 7/31:46/56 Goal status: MET  4.  Patient will perform toileting with mod I at home for improved independence.  Baseline: 3/6: requires assistance 4/8: requires dependence on machine 5/20: requires assistance with use of wipe buddy.  7/31: continues to require use of sara steady and wipe buddy for safety and hygiene Goal status: ongoing     ASSESSMENT:  CLINICAL IMPRESSION: Treatment focused on standing ability to prepare for upcoming trial of standing frame. Patient still experienced some LE trembling with sit to stand but subsided with increased time on her feet and she was able to work hard and performed some static stand with some dynamic lateral weight shift, 1 good step today with RLE and then some lifting 1 UE off rail briefly- all with min assist and close monitoring for any knee buckling. She denied any low back pain reported in previous session.  Pt will continue to benefit from skilled therapy to address remaining deficits in order to improve overall QoL and return to PLOF.       OBJECTIVE IMPAIRMENTS: Abnormal gait, cardiopulmonary status limiting activity, decreased activity tolerance, decreased balance, decreased coordination, decreased endurance, decreased mobility, difficulty walking, decreased ROM, decreased strength, hypomobility, increased fascial restrictions, impaired perceived functional ability, impaired flexibility, impaired sensation, improper body mechanics, and postural dysfunction.   ACTIVITY LIMITATIONS: carrying, lifting, bending, sitting, standing, squatting, sleeping, stairs, transfers, bed mobility, continence, bathing, toileting, dressing, self feeding, reach over head, hygiene/grooming, locomotion level, and caring for others  PARTICIPATION LIMITATIONS: meal prep, cleaning, laundry, interpersonal relationship, driving, shopping, community activity, and church  PERSONAL FACTORS: Age,  Past/current experiences, Time since onset of injury/illness/exacerbation, Transportation, and 3+ comorbidities: ack pain, CBP, chronic L shoulder pain, galactorrhea, neuropathy, HPV, hypercholestermeia, HTN, MS, osteopenia, PONV, wrist fracture are also affecting patient's functional outcome.   REHAB POTENTIAL: Good  CLINICAL DECISION MAKING: Evolving/moderate complexity  EVALUATION COMPLEXITY: Moderate  PLAN:  PT FREQUENCY: 2x/week  PT DURATION: 12 weeks  PLANNED INTERVENTIONS: 02835- PT  Re-evaluation, 97110-Therapeutic exercises, 97530- Therapeutic activity, [id number]- Neuromuscular re-education, [phone]- Self Care, [id number]- Manual therapy, [phone]- Gait training, [phone]- Orthotic Fit/training, [phone]- Canalith repositioning, [phone]- Aquatic Therapy, [phone]- Electrical stimulation (unattended), [phone]- Electrical stimulation (manual), [id number]- Ultrasound, [id number]- Traction (mechanical), Patient/Family education, Balance training, Stair training, Taping, Dry Needling, Joint mobilization, Joint manipulation, Spinal manipulation, Spinal mobilization, Scar mobilization, Compression bandaging, Vestibular training, Visual/preceptual remediation/compensation, Cognitive remediation, DME instructions, Cryotherapy, Moist heat, and Biofeedback  PLAN FOR NEXT SESSION:  Sit to stands; try supported walking, continue dynamic core stabilization training and seated reaching tasks to improve weight shifting.    Chyrl London, PT Physical Therapist - [facility]  8:48 AM 12/26/23

## 2023-12-25 NOTE — Therapy (Signed)
 OUTPATIENT OCCUPATIONAL THERAPY NEURO TREATMENT NOTE  Patient Name: Lisa Williams MRN: [id number] DOB:06/05/1960, 63 y.o., female Today's Date: 12/25/2023  PCP: Dr. Layman Piety   Neurologist Dr. Suanne at [facility] REFERRING PROVIDER: Dr. Layman Piety    END OF SESSION:  OT End of Session - 12/25/23 1234     Visit Number 31    Number of Visits 44    Date for OT Re-Evaluation 02/05/24    Authorization Time Period Reporting period beginning 12/20/23    Progress Note Due on Visit 40    OT Start Time 1100    OT Stop Time 1145    OT Time Calculation (min) 45 min    Equipment Utilized During Treatment manual wc    Activity Tolerance Patient tolerated treatment well    Behavior During Therapy [facility] for tasks assessed/performed          Past Medical History:  Diagnosis Date   Abdominal pain, right upper quadrant    Back pain    Calculus of kidney 12/09/2013   Chronic back pain    unspecified   Chronic left shoulder pain 07/19/2015   Complication of anesthesia    Functional disorder of bladder    other   Galactorrhea 11/26/2014   Chronic    Hereditary and idiopathic neuropathy 08/19/2013   History of kidney stones    HPV test positive    Hypercholesteremia 08/19/2013   Hypertension    Incomplete bladder emptying    Microscopic hematuria    MS (multiple sclerosis) (HCC)    Muscle spasticity 05/21/2014   Nonspecific findings on examination of urine    other   Osteopenia    PONV (postoperative nausea and vomiting)    Status post laparoscopic supracervical hysterectomy 11/26/2014   Tobacco user 11/26/2014   Wrist fracture    Past Surgical History:  Procedure Laterality Date   bilateral tubal ligation  1996   BREAST CYST EXCISION Left 2002   CYST EXCISION Left 05/10/2022   Procedure: CYST REMOVAL;  Surgeon: Rodolph Romano, MD;  Location: [facility] ORS;  Service: General;  Laterality: Left;   FRACTURE SURGERY     KNEE SURGERY     right   LAPAROSCOPIC  SUPRACERVICAL HYSTERECTOMY  08/05/2013   ORIF WRIST FRACTURE Left 01/17/2017   Procedure: OPEN REDUCTION INTERNAL FIXATION (ORIF) WRIST FRACTURE;  Surgeon: Leora Lynwood SAUNDERS, MD;  Location: [facility] ORS;  Service: Orthopedics;  Laterality: Left;   RADIOLOGY WITH ANESTHESIA N/A 03/18/2020   Procedure: MRI WITH ANESTHESIA CERVICAL SPINE AND BRAIN  WITH AND WITHOUT CONTRAST;  Surgeon: Radiologist, Medication, MD;  Location: [facility] OR;  Service: Radiology;  Laterality: N/A;   TUBAL LIGATION Bilateral    VAGINAL HYSTERECTOMY  03/2006   Patient Active Problem List   Diagnosis Date Noted   Abnormal LFTs (liver function tests) 09/17/2023   Sinus tachycardia 07/07/2023   Aortic atherosclerosis (HCC) 01/18/2023   Prediabetes 01/18/2023   Hypokalemia 07/29/2022   Weakness of both lower extremities 07/29/2022   Abscess of left groin 11/15/2021   Abnormal LFTs 11/15/2021   Acute respiratory disease due to COVID-19 virus 11/21/2020   Weakness    Hypoalbuminemia due to protein-calorie malnutrition [facility])    Neurogenic bowel    Neurogenic bladder    Labile blood pressure    Neuropathic pain    Multiple sclerosis, relapsing-remitting (HCC) 07/09/2019   Abscess of female pelvis    SVT (supraventricular tachycardia) (HCC)    Radial styloid tenosynovitis 03/12/2018   Wheelchair dependence 02/27/2018  Localized osteoporosis with current pathological fracture with routine healing 01/19/2017   Wrist fracture 01/16/2017   Sprain of ankle 03/23/2016   Closed fracture of lateral malleolus 03/16/2016   Health care maintenance 01/24/2016   Blood pressure elevated without history of HTN 10/25/2015   Essential hypertension 10/25/2015   Multiple sclerosis (HCC) 10/02/2015   Chronic left shoulder pain 07/19/2015   Multiple sclerosis exacerbation (HCC) 07/14/2015   MS (multiple sclerosis) (HCC) 11/26/2014   Increased body mass index 11/26/2014   HPV test positive 11/26/2014   Status post laparoscopic supracervical  hysterectomy 11/26/2014   Galactorrhea 11/26/2014   Back ache 05/21/2014   Adiposity 05/21/2014   Disordered sleep 05/21/2014   Muscle spasticity 05/21/2014   Spasticity 05/21/2014   Calculus of kidney 12/09/2013   Renal colic 12/09/2013   Hypercholesteremia 08/19/2013   Hereditary and idiopathic neuropathy 08/19/2013   Hypercholesterolemia without hypertriglyceridemia 08/19/2013   Bladder infection, chronic 07/25/2012   Disorder of bladder function 07/25/2012   Incomplete bladder emptying 07/25/2012   Microscopic hematuria 07/25/2012   Right upper quadrant pain 07/25/2012   ONSET DATE: 5/24 Amon and broke R leg which caused beginning of ADL decline; diagnosed with MS in 1995)  REFERRING DIAG: MS  THERAPY DIAG:  Muscle weakness (generalized)  Other lack of coordination  Multiple sclerosis exacerbation (HCC)  Rationale for Evaluation and Treatment: Rehabilitation  SUBJECTIVE:  SUBJECTIVE STATEMENT: Pt reports her legs are feeling good today so far.  Pt accompanied by: self  PERTINENT HISTORY: Pt reports increased difficulty with basic self care tasks since breaking her R leg last May.  Since that time, mobility and ADLs have declined, with pt requiring assist from private caregivers and family to manage bathing, toileting, dressing, and functional transfers.  PRECAUTIONS: Fall  WEIGHT BEARING RESTRICTIONS: No  PAIN: 12/20/23: 7/10 pain in back; pt reports she took a Meloxicam  this morning but is still quite sore Are you having pain? No; occasional pain in R lower back, but not today  FALLS: Has patient fallen in last 6 months? No Last fall was May 17 of 2024  LIVING ENVIRONMENT: Lives with: lives with their family (including mother who has dementia and sister [name])  Lives in: 2 level home but pt resides on main level Stairs: ramp  Has following equipment at home: Wheelchair (manual), Shower bench, and hand held shower shower, 1 grab bar in the shower, 3in1 commode,  sit to stand lift (electric), FWW, hospital bed  PLOF: modified indep with ADL/IADLs prior to May of 7975   PATIENT GOALS: Increase independence with basic self care tasks  OBJECTIVE:  Note: Objective measures were completed at Evaluation unless otherwise noted.  HAND DOMINANCE: Right  ADLs:  Overall ADLs: assist provided from paid caregiver and sister Transfers/ambulation related to ADLs: sit to stand lift for all transfers, set up for sliding board in/out of bed  Eating: indep  Grooming: modified indep (wc level in mom's bathroom)  UB Dressing: set up (can't access her bedroom closet)  LB Dressing: able to sit on side of bed to don all LB clothing; assist to hike pants/underwear Toileting: assist with clothing management d/t standing within electric lift Bathing: bed bath with sister helping to wash backside Tub Shower transfers: N/A; pt has walk in shower in mom's bathroom (wc does fit in this bathroom but pt reports inability to transfer from wc and requires arm rests on a bench for a successful transfer attempt from any DME).  Tub bench is in pt's bathroom, but  lift does not fit through bathroom doorway and pt reports inability to transfer up from tub bench (currently unable to manage either transfer)   Equipment: see above  IADLs: Shopping: [employer] for shopping Light housekeeping: modified indep from wc level Meal Prep: modified indep from wc level Community mobility: relies on community transportation or family members Medication management: indep Landscape architect: indep Handwriting: NT; pt denies any FMC challenges  MOBILITY STATUS: Hx of falls  POSTURE COMMENTS:  Rounded shoulders, forward head, anterior pelvic tilt, and weight shift left   ACTIVITY TOLERANCE: Activity tolerance: Per PT; currently standing 30-60 sec within Light Gait harness/lift; currently non-ambulatory  UPPER EXTREMITY ROM:  BUEs WFL  UPPER EXTREMITY MMT:     MMT Right eval Left eval   Shoulder flexion 4+ 4  Shoulder abduction 4+ 4  Shoulder adduction    Shoulder extension    Shoulder internal rotation 4+ 4+  Shoulder external rotation 4 4  Middle trapezius    Lower trapezius    Elbow flexion 4+ 4+  Elbow extension 4+ 4+  Wrist flexion 4+ 4+  Wrist extension 4+ 4+  Wrist ulnar deviation    Wrist radial deviation    Wrist pronation    Wrist supination    (Blank rows = not tested)  HAND FUNCTION/COORDINATION:  R/L WNL; pt denies any coordination deficits/ able to manipulate pills/clothing fasteners/etc without difficulty  11/15/23: *Measures taken d/t PT note revealing pt with increased difficulty manipulating smaller ADL supplies during reaching activities in PT session   11/15/23: Grip strength: R: 70 lbs, L: 63 lbs Pinch strength: Lateral: R: 16 lbs, L: 15 lbs; 3 point pinch: R: 12 lbs, L: 13 lbs 9 hole peg test: Right: 25 sec, L 49 sec   12/25/23: Grip strength: Right: 70 lbs; Left: 65 lbs  9 hole peg test: Right: 24 sec; Left: 3 trials: 54 sec, 1 min, 56 sec  SENSATION: WFL  EDEMA: No visible edema in BUEs  MUSCLE TONE: BUEs WNL  COGNITION: Overall cognitive status: Within functional limits for tasks assessed  VISION: wears glasses all the time, no reports of diplopia  PERCEPTION: Not tested  PRAXIS: WFL  OBSERVATIONS:  Pt pleasant, cooperative, and motivated to work towards improving indep with ADLs.                                                                                                     TREATMENT DATE: 12/25/23 Self Care: -Scoot/pivot transfer wc<>toilet with min guard. Min vc to attempt larger and fewer scoots, rather than smaller scoots that require higher reps to fully transfer to/from toilet.  -OT reviewed plan to begin squat pivot attempts, and provided demo of scoot vs squat vs step pivot to work towards progressing transfers.     Therapeutic Activity: -Objective measures taken and goals updated (carry over from progress  note last session).  -L [facility] training: Facilitated L FMC and dexterity skills, working to pick up glass stones from dish, store in palm, and use translatory movements moving [place] stones between palm and fingertips in prep for discarding stones  1 at a time into dish.    PATIENT EDUCATION: Education details: transfer training Person educated: Patient Education method: Explanation, vc, demo Education comprehension: verbalized understanding, demonstrated understanding, further training needed  HOME EXERCISE PROGRAM: IR A/AAROM to promote shoulder flexibility for peri care/bathing  GOALS: Goals reviewed with patient? Yes  SHORT TERM GOALS: Target date: 12/25/23  Pt will perform bed bath with set up only. Baseline: Eval: Min-mod A for posterior washing; 09/28/23: Min A for posterior washing; 11/13/23: Able to manage bed bath but sister helps with thoroughness, per pt's preference; pt in agreement to trial bath sitting EOB or in wc before next OT session. Goal status: achieved/d/c  2.  Pt will utilize sliding board for wc<>drop arm commode transfer with SBA. Baseline: Eval: Currently using electric lift for transfer to [facility]; 09/28/23: Not yet completed at home but transfers to commode have been easier without sliding board d/t pt implementing a scoot pivot. Goal status: d/c (pt does not prefer sliding board for commode transfer)  3.  Pt will be indep to perform HEP for maintaining BUE strength for ADLs and functional transfers. Baseline: Eval: HEP not yet initiated; 09/28/23: Pt is working on core stability exercises from wc, cane stretches for bilat shoulder IR, and tricep extension via wc pushups; 11/13/23: indep Goal status: achieved  LONG TERM GOALS: Target date: 02/05/24  Pt will perform sink bath with set up A. (Revised on 11/13/23 from min A to set up) Baseline: Eval: Currently bed bathing d/t inability to stand at sink without electric lift (lift does not fit into pt's bathroom); 09/28/23:  still completing bed bath as pt is not yet able to stand; 11/13/23: Not yet attempted; pt encouraged to attempt before next OT session now that bed bath goal has been met; 12/20/23: Pt is now completing sponge bath with min A while seated EOB (caregiver assists with washing peri area d/t no bed rail to aid in R lateral lean) Goal status: ongoing   2.  Pt will perform wc<>tub bench transfer with min A. Baseline: Eval: Unable; 09/28/23: Performed in OT clinic with min A, but not yet in the home Goal status: d/c (pt prefers sink bath)  3.  Pt will perform squat pivot transfer wc<>BSC with SBA.  Baseline: Eval: Eval: Currently using electric lift for transfer to [facility]; 09/28/23: Not yet completed in clinic with [facility], and using lift for [facility] in the home still; 11/13/23: Still using Camie lift at home and in clinic transfers are all scoot rather than squat pivots d/t LE weakness; 12/20/23: Pt reports that she plans to use her Camie lift until she can ambulate to her [facility] d/t limited space for wc in her desired location for [facility] at home. Goal status: d/c  4. Pt will perform scoot transfer wc<>toilet using grab bars with supv to allow toileting in community setting.  Baseline: Eval: Pt limits community outings d/t requiring use of lift for [facility] transfers; 09/28/23: Pt has performed 2 successful scoot pivot  Transfers to toilet in OT clinic with min A, extra time.  Further trials with clothing management on toilet needed, but pt can lower and hike  pants while sititng edge of mat via lateral leaning and min A to maintain core stability; 11/13/23: Pt performs with CGA and extra time; 12/19/23: Pt   performs with close supv on most attempts, occasional CGA; efficiency is improving  Goal status: ongoing  5.  Pt will complete seated clothing management with min A to enable voiding  in handicapped stall within community. Baseline: Recert 11/13/23: Pt can transfer to toilet with increased time and effort, but has not yet attempted  clothing management or voiding while seated on commode in community setting.  Pt can lower pants to knees while seated in wc, but requires mod A to hike over hips from seated position; 12/20/23: Pt fully lowers panties and shorts while seated on commode with supv, can hike clothing to her her upper thighs, transfers back to wc, and then performs a tricep extension from wc level to allow caregiver to hike clothing remainder of the way for purposes of EC (min A for clothing management component) Goal status: achieved  6.  Pt will tolerate standing x1 min with close supv and BUE support to manage clothing in prep for toileting. Baseline: Eval: Currently standing in electric lift only, or within parallel bars without lift; 09/28/23: Not yet attempted; 11/13/23: Pt is able to perform static standing with heavy BUE support on parallel bars with PT; 12/19/22: Not attempted recently d/t new intermittent clonus in BLEs  Goal status: in progress  7.  Pt will increase bilat grip strength by (TBD) in order to securely grasp walker sufficiently for functional transfers and ambulation.  Baseline: Recert 11/13/23: TBD (PT note read following OT session, indicating pt with difficulty securing items in hand);  11/15/23: Grip strength: R: 70 lbs, L: 63 lbs; 12/25/23: R 70 lbs, L 65 lbs  Goal status: in progress  8.  Pt will increase bilat [facility] skills in order to manipulate small ADL supplies with reduced dropping as indicated by (TBD) improvement in 9 hole peg test. Baseline: Recert 11/13/23: 9 hole peg test TBD (PT note read following OT session, indicating pt with difficulty manipulating and securing items in hands); 11/15/23: Right: 25 sec, L 49 sec; 12/25/23: Right: 24 sec; Left: 3 trials: 54 sec, 1 min, 56 sec  Goal status: in progress  ASSESSMENT: CLINICAL IMPRESSION: Pt continues to make steady gains with scoot pivot transfers.  Will plan to begin attempts at transitioning to squat pivot transfers, as pt's eventual goal  is to be transferring from a standing positioning using a walker.  Objective measures for grip and FMC updated above.  Pt struggled with 9 hole peg test in the L hand this date, having several dropped pegs with each attempted trial.  OT reminded pt that physical performance can fluctuate daily with MS, which pt acknowledged.  Pt reported that she and her sister did try positioning her wc to her R side for extra support while seated EOB to perform sink bath, but pt reported limited benefit from this d/t height and angle of the wheels and arm rests from what pt could reach while sitting EOB.  Pt will continue to benefit from skilled OT to work towards above noted goals in OT poc, working to maximize indep with daily tasks while reducing burden of care on caregivers.   PERFORMANCE DEFICITS: in functional skills including ADLs, IADLs, strength, pain, flexibility, Gross motor control, mobility, balance, body mechanics, endurance, and decreased knowledge of use of DME, and psychosocial skills including coping strategies, environmental adaptation, habits, and routines and behaviors.   IMPAIRMENTS: are limiting patient from ADLs, IADLs, and social participation.   CO-MORBIDITIES: has co-morbidities such as neuropathy, back pain, obesity, MS, HTN that affects occupational performance. Patient will benefit from skilled OT to address above impairments and improve overall function.  MODIFICATION OR ASSISTANCE TO COMPLETE EVALUATION: No modification of tasks or assist necessary to complete  an evaluation.  OT OCCUPATIONAL PROFILE AND HISTORY: Detailed assessment: Review of records and additional review of physical, cognitive, psychosocial history related to current functional performance.  CLINICAL DECISION MAKING: Moderate - several treatment options, min-mod task modification necessary  REHAB POTENTIAL: Good  EVALUATION COMPLEXITY: Moderate  PLAN:  OT FREQUENCY: 2x/week  OT DURATION: 12 weeks  PLANNED  INTERVENTIONS: 97168 OT Re-evaluation, 97535 self care/ADL training, 02889 therapeutic exercise, 97530 therapeutic activity, 97112 neuromuscular re-education, 97140 manual therapy, 97116 gait training, 02989 moist heat, 97010 cryotherapy, balance training, functional mobility training, psychosocial skills training, energy conservation, coping strategies training, patient/family education, and DME and/or AE instructions  RECOMMENDED OTHER SERVICES: None at this time (Pt currently receiving PT services in this clinic)  CONSULTED AND AGREED WITH PLAN OF CARE: Patient  PLAN FOR NEXT SESSION: see above  Inocente Blazing, MS, OTR/L  Inocente MARLA Blazing, OT 12/25/2023, 12:37 PM

## 2023-12-27 ENCOUNTER — Ambulatory Visit

## 2023-12-27 ENCOUNTER — Ambulatory Visit: Payer: PPO

## 2023-12-27 DIAGNOSIS — R278 Other lack of coordination: Secondary | ICD-10-CM

## 2023-12-27 DIAGNOSIS — M6281 Muscle weakness (generalized): Secondary | ICD-10-CM

## 2023-12-27 DIAGNOSIS — G35 Multiple sclerosis: Secondary | ICD-10-CM

## 2023-12-27 DIAGNOSIS — R2689 Other abnormalities of gait and mobility: Secondary | ICD-10-CM

## 2023-12-27 DIAGNOSIS — R2681 Unsteadiness on feet: Secondary | ICD-10-CM

## 2023-12-27 DIAGNOSIS — R269 Unspecified abnormalities of gait and mobility: Secondary | ICD-10-CM

## 2023-12-27 DIAGNOSIS — R262 Difficulty in walking, not elsewhere classified: Secondary | ICD-10-CM

## 2023-12-27 NOTE — Therapy (Signed)
 OUTPATIENT PHYSICAL THERAPY NEURO TREATMENT/RECERT   Patient Name: Lisa Williams MRN: [id number] DOB:[birth year], 63 y.o., female Today's Date: 12/27/2023   PCP: Lenon Na  REFERRING PROVIDER: Lenon Na  END OF SESSION:  PT End of Session - 12/27/23 1029     Visit Number 46    Number of Visits 70    Date for PT Re-Evaluation 03/20/24   corrected   Progress Note Due on Visit 50    PT Start Time 1015    PT Stop Time 1055    PT Time Calculation (min) 40 min    Equipment Utilized During Treatment Gait belt    Activity Tolerance Patient tolerated treatment well;No increased pain    Behavior During Therapy [facility] for tasks assessed/performed               Past Medical History:  Diagnosis Date   Abdominal pain, right upper quadrant    Back pain    Calculus of kidney 12/09/2013   Chronic back pain    unspecified   Chronic left shoulder pain 07/19/2015   Complication of anesthesia    Functional disorder of bladder    other   Galactorrhea 11/26/2014   Chronic    Hereditary and idiopathic neuropathy 08/19/2013   History of kidney stones    HPV test positive    Hypercholesteremia 08/19/2013   Hypertension    Incomplete bladder emptying    Microscopic hematuria    MS (multiple sclerosis) (HCC)    Muscle spasticity 05/21/2014   Nonspecific findings on examination of urine    other   Osteopenia    PONV (postoperative nausea and vomiting)    Status post laparoscopic supracervical hysterectomy 11/26/2014   Tobacco user 11/26/2014   Wrist fracture    Past Surgical History:  Procedure Laterality Date   bilateral tubal ligation  1996   BREAST CYST EXCISION Left 2002   CYST EXCISION Left 05/10/2022   Procedure: CYST REMOVAL;  Surgeon: Rodolph Romano, MD;  Location: [facility] ORS;  Service: General;  Laterality: Left;   FRACTURE SURGERY     KNEE SURGERY     right   LAPAROSCOPIC SUPRACERVICAL HYSTERECTOMY  08/05/2013   ORIF WRIST  FRACTURE Left 01/17/2017   Procedure: OPEN REDUCTION INTERNAL FIXATION (ORIF) WRIST FRACTURE;  Surgeon: Leora Lynwood SAUNDERS, MD;  Location: [facility] ORS;  Service: Orthopedics;  Laterality: Left;   RADIOLOGY WITH ANESTHESIA N/A 03/18/2020   Procedure: MRI WITH ANESTHESIA CERVICAL SPINE AND BRAIN  WITH AND WITHOUT CONTRAST;  Surgeon: Radiologist, Medication, MD;  Location: [facility] OR;  Service: Radiology;  Laterality: N/A;   TUBAL LIGATION Bilateral    VAGINAL HYSTERECTOMY  03/2006   Patient Active Problem List   Diagnosis Date Noted   Abnormal LFTs (liver function tests) 09/17/2023   Sinus tachycardia 07/07/2023   Aortic atherosclerosis (HCC) 01/18/2023   Prediabetes 01/18/2023   Hypokalemia 07/29/2022   Weakness of both lower extremities 07/29/2022   Abscess of left groin 11/15/2021   Abnormal LFTs 11/15/2021   Acute respiratory disease due to COVID-19 virus 11/21/2020   Weakness    Hypoalbuminemia due to protein-calorie malnutrition [facility])    Neurogenic bowel    Neurogenic bladder    Labile blood pressure    Neuropathic pain    Multiple sclerosis, relapsing-remitting (HCC) 07/09/2019   Abscess of female pelvis    SVT (supraventricular tachycardia) (HCC)    Radial styloid tenosynovitis 03/12/2018   Wheelchair dependence 02/27/2018   Localized osteoporosis with current pathological fracture with routine healing  01/19/2017   Wrist fracture 01/16/2017   Sprain of ankle 03/23/2016   Closed fracture of lateral malleolus 03/16/2016   Health care maintenance 01/24/2016   Blood pressure elevated without history of HTN 10/25/2015   Essential hypertension 10/25/2015   Multiple sclerosis (HCC) 10/02/2015   Chronic left shoulder pain 07/19/2015   Multiple sclerosis exacerbation (HCC) 07/14/2015   MS (multiple sclerosis) (HCC) 11/26/2014   Increased body mass index 11/26/2014   HPV test positive 11/26/2014   Status post laparoscopic supracervical hysterectomy 11/26/2014   Galactorrhea 11/26/2014    Back ache 05/21/2014   Adiposity 05/21/2014   Disordered sleep 05/21/2014   Muscle spasticity 05/21/2014   Spasticity 05/21/2014   Calculus of kidney 12/09/2013   Renal colic 12/09/2013   Hypercholesteremia 08/19/2013   Hereditary and idiopathic neuropathy 08/19/2013   Hypercholesterolemia without hypertriglyceridemia 08/19/2013   Bladder infection, chronic 07/25/2012   Disorder of bladder function 07/25/2012   Incomplete bladder emptying 07/25/2012   Microscopic hematuria 07/25/2012   Right upper quadrant pain 07/25/2012    ONSET DATE: 1995  REFERRING DIAG: MS  THERAPY DIAG:  Muscle weakness (generalized) - Plan: PT plan of care cert/re-cert  Other lack of coordination - Plan: PT plan of care cert/re-cert  Multiple sclerosis exacerbation (HCC) - Plan: PT plan of care cert/re-cert  Difficulty in walking, not elsewhere classified - Plan: PT plan of care cert/re-cert  Unsteadiness on feet - Plan: PT plan of care cert/re-cert  Abnormality of gait and mobility - Plan: PT plan of care cert/re-cert  Other abnormalities of gait and mobility - Plan: PT plan of care cert/re-cert  Rationale for Evaluation and Treatment: Rehabilitation  SUBJECTIVE:                                                                                                                                                                                             SUBJECTIVE STATEMENT:  Pt reports doing well with no issues today.  Pt accompanied by: self  PERTINENT HISTORY:  Patient is returning to PT s/p hospitalization.  s/p ORIF of R tibia shaft fracture 09/23/2022. Patient has weakness in BLE with RLE>LLE. She drives with hand controls. Patient has been diagnosed with MS in 1995. PMH includes: back pain, CBP, chronic L shoulder pain, galactorrhea, neuropathy, HPV, hypercholestermeia, HTN, MS, osteopenia, PONV, wrist fracture. Additional order for other closed fracture of proximal end of R tibia with routine  healing. Still has to use [employer].   PAIN:  Are you having pain? Occasional pain in RLE; primarily in knee  PRECAUTIONS: Fall  RED FLAGS: None   WEIGHT BEARING RESTRICTIONS: No  FALLS:  Has patient fallen in last 6 months? No  LIVING ENVIRONMENT: Lives with: lives with their family Lives in: House/apartment Stairs: ramp Has following equipment at home: Vannie - 2 wheeled, Wheelchair (manual), shower chair, and Grab bars  PLOF: Independent with household mobility with device  PATIENT GOALS: to get her independence back. To be able to get into car, toilet, and get dressed independently  OBJECTIVE:  Note: Objective measures were completed at Evaluation unless otherwise noted.  DIAGNOSTIC FINDINGS: MRI of the brain 03/18/2020 showed multiple T2/FLAIR hyperintense foci in the periventricular, juxtacortical and deep white matter.  There were no infratentorial lesions noted.  None of the foci enhanced.   Due to severe claustrophobia, the study was done with conscious sedation in the hospital   COGNITION: Overall cognitive status: Within functional limits for tasks assessed   SENSATION: Lack of sensation in feet, loss in bilateral lateral aspect of knee  COORDINATION: Does not have the strength for functional LE heel slide test   MUSCLE TONE: BLE mild tone    POSTURE: rounded shoulders, forward head, anterior pelvic tilt, and weight shift left   LOWER EXTREMITY MMT:    MMT Right Eval Left Eval  Hip flexion 0.9 1.5  Hip extension    Hip abduction 1.8 2.6  Hip adduction 3.1 1.9  Hip internal rotation    Hip external rotation    Knee flexion 0.9 1.5  Knee extension 0.8 1.2  Ankle dorsiflexion    Ankle plantarflexion    Ankle inversion    Ankle eversion    (Blank rows = not tested)  BED MOBILITY:  Assess in future session due to limited time  TRANSFERS: Assistive device utilized: Bariatric RW with sit to stands, slide board to table  Sit to stand: unable to  reach full stand Stand to sit: unable to reach full stand  Chair to chair: slide board with CGA   GAIT: Unable to ambulate at this time.   FUNCTIONAL TESTS:  Sit to stand: tricep press with BUE; unable to bring arm to walker.    Function In Sitting Test (FIST)  (1/2 femur on surface; hips/knees flexed to 90deg)   - indicate bed or mat table / step stool if used  SCORING KEY: 4 = Independent (completes task independently & successfully) 3 = Verbal Cues/Increased Time (completes task independently & successfully and only needs more time/cues) 2 = Upper Extremity Support (must use UE for support or assistance to complete successfully) 1 = Needs Assistance (unable to complete w/o physical assist; DOCUMENT LEVEL: min, mod, max) 0 = Dependent (requires complete physical assist; unable to complete successfully even w/ physical assist)  Randomly Administer Once Throughout Exam  4 - Anterior Nudge (superior sternum)  4 - Posterior Nudge (between scapular spines)  4 - Lateral Nudge (to dominant side at acromion)     4 - Static sitting (30 seconds)  4 - Sitting, shake 'no' (left and right)  4 - Sitting, eyes closed (30 seconds)   0 - Sitting, lift foot (dominant side, lift foot 1 inch twice)    2 - Pick up object from behind (object at midline, hands breadth posterior)  3 - Forward reach (use dominant arm, must complete full motion) 2 - Lateral reach (use dominant arm, clear opposite ischial tuberosity) 2 - Pick up object from floor (from between feet)   2 - Posterior scooting (move backwards 2 inches)  2 - Anterior scooting (move forward 2 inches)  2 - Lateral scooting (move to  dominant side 2 inches)    TOTAL = 39/56  Notes/comments: slide board tranfer to/from table   MCD > 5 points MCID for IP REHAB > 6 points  Function In Sitting Test (FIST) 08/14/23 (1/2 femur on surface; hips/knees flexed to 90deg)   - indicate bed or mat table / step stool if used  SCORING KEY: 4 =  Independent (completes task independently & successfully) 3 = Verbal Cues/Increased Time (completes task independently & successfully and only needs more time/cues) 2 = Upper Extremity Support (must use UE for support or assistance to complete successfully) 1 = Needs Assistance (unable to complete w/o physical assist; DOCUMENT LEVEL: min, mod, max) 0 = Dependent (requires complete physical assist; unable to complete successfully even w/ physical assist)  Randomly Administer Once Throughout Exam  4 - Anterior Nudge (superior sternum)  4 - Posterior Nudge (between scapular spines)  4 - Lateral Nudge (to dominant side at acromion)     4 - Static sitting (30 seconds)  4 - Sitting, shake 'no' (left and right)  4 - Sitting, eyes closed (30 seconds)   0 - Sitting, lift foot (dominant side, lift foot 1 inch twice)    4 - Pick up object from behind (object at midline, hands breadth posterior)  4 - Forward reach (use dominant arm, must complete full motion) 4 - Lateral reach (use dominant arm, clear opposite ischial tuberosity)  - Pick up object from floor (from between feet)   3 - Posterior scooting (move backwards 2 inches)  3 - Anterior scooting (move forward 2 inches)  3 - Lateral scooting (move to dominant side 2 inches)    TOTAL = 47/56  MCD > 5 points MCID for IP REHAB > 6 points                                                                                                                              TREATMENT DATE: 12/27/23  Physical therapy treatment session today consisted of completing assessment of goals and administration of testing as demonstrated and documented in flow sheet, treatment, and goals section of this note. Addition treatments may be found below.    TherAct:  In // bars with gait belt:  STS x 3 total- Able to stand with min-mod A assist. Continued LE trembling upon standing but none with sitting- PT did help with eccentric stand to sit today.   Static  standing in // bars with Min A to maintain standing, use of gait belt, and w/c positioned directly behind patient.  1) 2 min 11 sec 2)  11 sec 3) attempted but unable   THEREX:  Tricep press down- 7.5# 2 sets of 12 reps  Seated dead lift 8# db- each arm 2 x 12 reps      Thorough rest breaks provided during session to prevent fatigue       PATIENT EDUCATION: Education details: Pt educated throughout session about proper posture and  technique with exercises. Improved exercise technique, movement at target joints, use of target muscles after min to mod verbal, visual, tactile cues.  Person educated: Patient Education method: Explanation, Demonstration, Tactile cues, and Verbal cues Education comprehension: verbalized understanding, returned demonstration, verbal cues required, tactile cues required, and needs further education  HOME EXERCISE PROGRAM: Stand 3x/day in stander   GOALS: Goals reviewed with patient? Yes  SHORT TERM GOALS: Target date: 08/08/2023  Patient will be independent in home exercise program to improve strength/mobility for better functional independence with ADLs.  Baseline: 4/8: compliance Goal status: MET    LONG TERM GOALS: Target date: 03/20/2024  Patient will tolerate five consecutive stands with UE support from wheelchair to standing to improve functional mobility with additional cushion and RW.  Baseline: unable to perform 4/8:unable to perform full stand 5/20: perform next session 5/29: two full stands with CGA and cushion on her seat 7/31: able to stand 3 times from [facility]. With no addition cushion  in parallel bars. 12/27/2023= Will retest next session- concentrated on just standing on past 2 visits Goal status: Ongoing   2.  Patient will ambulate 10 ft with bRW with wheelchair follow.  Baseline:  unable to ambulate 4/8: unable to ambulate 5/20: able to ambulate length of // bars in previous session 5/27: ambulate 2.5 lengths of // bars with two  seated rest breaks with min A for weight shift and wheelchair follow  7/31: able to ambulate in parallel bars 3 ft with min assist and +2 follow in WC> difficulty advancing the LLE on this day. 12/27/2023- Patient unable to take a step forward today but was abel to take a step on previous visit- limited by clonus like trembling that has progressed over past month. Goal status: Ongoing   3.  Patient will improve FIST score >6 points to demonstrate improved stability and ability to perform ADLs.  Baseline:  3/6: 39/56 4/8: 47/ 56 5/20: 48/56 7/31:46/56 Goal status: MET  4.  Patient will perform toileting with mod I at home for improved independence.  Baseline: 3/6: requires assistance 4/8: requires dependence on machine 5/20: requires assistance with use of wipe buddy.  7/31: continues to require use of sara steady and wipe buddy for safety and hygiene. 12/27/2023- Patient reports that since she is unable to take steps that she continues to require use of sara steady and wipe buddy for safe personal hygiene. Goal status: ongoing   5. Patient will perform > 5 min of static or dynamic standing with BUE and Min/mod assist  for improved LE strength and pre-gait function to assist with toileting and overall standing ability for improve functional independence.  Baseline: 12/27/2023-Patient was able to stand for just over 3 min today. Goal status: New    ASSESSMENT:  CLINICAL IMPRESSION: Patient presents with good motivation for today's re-cert visit. She was able to stand and stand fairly erect with min assist and minimal trembling. She has progressed recently and added a standing goal to continue to work on her pre-gait ability and overall strength. She has struggled lately to take a any functional steps and this will continue to be an area of focus. Will continue to work her posture and overall UE strength for transfers. Overall less trembling last couple of sessions and Patient's condition has the  potential to improve in response to therapy. Maximum improvement is yet to be obtained. The anticipated improvement is attainable and reasonable in a generally predictable time.  Pt will continue to benefit from  skilled therapy to address remaining deficits in order to improve overall QoL and return to PLOF.       OBJECTIVE IMPAIRMENTS: Abnormal gait, cardiopulmonary status limiting activity, decreased activity tolerance, decreased balance, decreased coordination, decreased endurance, decreased mobility, difficulty walking, decreased ROM, decreased strength, hypomobility, increased fascial restrictions, impaired perceived functional ability, impaired flexibility, impaired sensation, improper body mechanics, and postural dysfunction.   ACTIVITY LIMITATIONS: carrying, lifting, bending, sitting, standing, squatting, sleeping, stairs, transfers, bed mobility, continence, bathing, toileting, dressing, self feeding, reach over head, hygiene/grooming, locomotion level, and caring for others  PARTICIPATION LIMITATIONS: meal prep, cleaning, laundry, interpersonal relationship, driving, shopping, community activity, and church  PERSONAL FACTORS: Age, Past/current experiences, Time since onset of injury/illness/exacerbation, Transportation, and 3+ comorbidities: ack pain, CBP, chronic L shoulder pain, galactorrhea, neuropathy, HPV, hypercholestermeia, HTN, MS, osteopenia, PONV, wrist fracture are also affecting patient's functional outcome.   REHAB POTENTIAL: Good  CLINICAL DECISION MAKING: Evolving/moderate complexity  EVALUATION COMPLEXITY: Moderate  PLAN:  PT FREQUENCY: 2x/week  PT DURATION: 12 weeks  PLANNED INTERVENTIONS: 97164- PT Re-evaluation, 97110-Therapeutic exercises, 97530- Therapeutic activity, 97112- Neuromuscular re-education, 97535- Self Care, [id number]- Manual therapy, [phone]- Gait training, [phone]- Orthotic Fit/training, [phone]- Canalith repositioning, [id number]- Aquatic Therapy, [phone]-  Electrical stimulation (unattended), [phone]- Electrical stimulation (manual), [id number]- Ultrasound, [id number]- Traction (mechanical), Patient/Family education, Balance training, Stair training, Taping, Dry Needling, Joint mobilization, Joint manipulation, Spinal manipulation, Spinal mobilization, Scar mobilization, Compression bandaging, Vestibular training, Visual/preceptual remediation/compensation, Cognitive remediation, DME instructions, Cryotherapy, Moist heat, and Biofeedback  PLAN FOR NEXT SESSION:  Sit to stands; try supported walking, continue dynamic core stabilization training and seated reaching tasks to improve weight shifting.    Chyrl London, PT Physical Therapist - [facility]  9:55 PM 12/27/23

## 2023-12-29 NOTE — Therapy (Signed)
 OUTPATIENT OCCUPATIONAL THERAPY NEURO TREATMENT NOTE  Patient Name: Sonal Dorwart Frisina MRN: [id number] DOB:1960/11/08, 63 y.o., female Today's Date: 12/29/2023  PCP: Dr. Layman Piety   Neurologist Dr. Suanne at [facility] REFERRING PROVIDER: Dr. Layman Piety    END OF SESSION:  OT End of Session - 12/29/23 2102     Visit Number 32    Number of Visits 44    Date for OT Re-Evaluation 02/05/24    Authorization Time Period Reporting period beginning 12/20/23    Progress Note Due on Visit 40    OT Start Time 1100    OT Stop Time 1145    OT Time Calculation (min) 45 min    Equipment Utilized During Treatment manual wc    Activity Tolerance Patient tolerated treatment well    Behavior During Therapy [facility] for tasks assessed/performed         Past Medical History:  Diagnosis Date   Abdominal pain, right upper quadrant    Back pain    Calculus of kidney 12/09/2013   Chronic back pain    unspecified   Chronic left shoulder pain 07/19/2015   Complication of anesthesia    Functional disorder of bladder    other   Galactorrhea 11/26/2014   Chronic    Hereditary and idiopathic neuropathy 08/19/2013   History of kidney stones    HPV test positive    Hypercholesteremia 08/19/2013   Hypertension    Incomplete bladder emptying    Microscopic hematuria    MS (multiple sclerosis) (HCC)    Muscle spasticity 05/21/2014   Nonspecific findings on examination of urine    other   Osteopenia    PONV (postoperative nausea and vomiting)    Status post laparoscopic supracervical hysterectomy 11/26/2014   Tobacco user 11/26/2014   Wrist fracture    Past Surgical History:  Procedure Laterality Date   bilateral tubal ligation  1996   BREAST CYST EXCISION Left 2002   CYST EXCISION Left 05/10/2022   Procedure: CYST REMOVAL;  Surgeon: Rodolph Romano, MD;  Location: [facility] ORS;  Service: General;  Laterality: Left;   FRACTURE SURGERY     KNEE SURGERY     right   LAPAROSCOPIC  SUPRACERVICAL HYSTERECTOMY  08/05/2013   ORIF WRIST FRACTURE Left 01/17/2017   Procedure: OPEN REDUCTION INTERNAL FIXATION (ORIF) WRIST FRACTURE;  Surgeon: Leora Lynwood SAUNDERS, MD;  Location: [facility] ORS;  Service: Orthopedics;  Laterality: Left;   RADIOLOGY WITH ANESTHESIA N/A 03/18/2020   Procedure: MRI WITH ANESTHESIA CERVICAL SPINE AND BRAIN  WITH AND WITHOUT CONTRAST;  Surgeon: Radiologist, Medication, MD;  Location: [facility] OR;  Service: Radiology;  Laterality: N/A;   TUBAL LIGATION Bilateral    VAGINAL HYSTERECTOMY  03/2006   Patient Active Problem List   Diagnosis Date Noted   Abnormal LFTs (liver function tests) 09/17/2023   Sinus tachycardia 07/07/2023   Aortic atherosclerosis (HCC) 01/18/2023   Prediabetes 01/18/2023   Hypokalemia 07/29/2022   Weakness of both lower extremities 07/29/2022   Abscess of left groin 11/15/2021   Abnormal LFTs 11/15/2021   Acute respiratory disease due to COVID-19 virus 11/21/2020   Weakness    Hypoalbuminemia due to protein-calorie malnutrition [facility])    Neurogenic bowel    Neurogenic bladder    Labile blood pressure    Neuropathic pain    Multiple sclerosis, relapsing-remitting (HCC) 07/09/2019   Abscess of female pelvis    SVT (supraventricular tachycardia) (HCC)    Radial styloid tenosynovitis 03/12/2018   Wheelchair dependence 02/27/2018  Localized osteoporosis with current pathological fracture with routine healing 01/19/2017   Wrist fracture 01/16/2017   Sprain of ankle 03/23/2016   Closed fracture of lateral malleolus 03/16/2016   Health care maintenance 01/24/2016   Blood pressure elevated without history of HTN 10/25/2015   Essential hypertension 10/25/2015   Multiple sclerosis (HCC) 10/02/2015   Chronic left shoulder pain 07/19/2015   Multiple sclerosis exacerbation (HCC) 07/14/2015   MS (multiple sclerosis) (HCC) 11/26/2014   Increased body mass index 11/26/2014   HPV test positive 11/26/2014   Status post laparoscopic supracervical  hysterectomy 11/26/2014   Galactorrhea 11/26/2014   Back ache 05/21/2014   Adiposity 05/21/2014   Disordered sleep 05/21/2014   Muscle spasticity 05/21/2014   Spasticity 05/21/2014   Calculus of kidney 12/09/2013   Renal colic 12/09/2013   Hypercholesteremia 08/19/2013   Hereditary and idiopathic neuropathy 08/19/2013   Hypercholesterolemia without hypertriglyceridemia 08/19/2013   Bladder infection, chronic 07/25/2012   Disorder of bladder function 07/25/2012   Incomplete bladder emptying 07/25/2012   Microscopic hematuria 07/25/2012   Right upper quadrant pain 07/25/2012   ONSET DATE: 5/24 Amon and broke R leg which caused beginning of ADL decline; diagnosed with MS in 1995)  REFERRING DIAG: MS  THERAPY DIAG:  Muscle weakness (generalized)  Other lack of coordination  Multiple sclerosis exacerbation (HCC)  Rationale for Evaluation and Treatment: Rehabilitation  SUBJECTIVE:  SUBJECTIVE STATEMENT: Pt reports she has an appointment to trial a standing frame next week during her PT session.  Pt accompanied by: self  PERTINENT HISTORY: Pt reports increased difficulty with basic self care tasks since breaking her R leg last May.  Since that time, mobility and ADLs have declined, with pt requiring assist from private caregivers and family to manage bathing, toileting, dressing, and functional transfers.  PRECAUTIONS: Fall  WEIGHT BEARING RESTRICTIONS: No  PAIN: 12/27/23: 4/10 pain in back; pt reports she took another Meloxicam  this morning but it's improved from last week Are you having pain? No; occasional pain in R lower back, but not today  FALLS: Has patient fallen in last 6 months? No Last fall was May 17 of 2024  LIVING ENVIRONMENT: Lives with: lives with their family (including mother who has dementia and sister [name])  Lives in: 2 level home but pt resides on main level Stairs: ramp  Has following equipment at home: Wheelchair (manual), Shower bench, and hand  held shower shower, 1 grab bar in the shower, 3in1 commode, sit to stand lift (electric), FWW, hospital bed  PLOF: modified indep with ADL/IADLs prior to May of 7975   PATIENT GOALS: Increase independence with basic self care tasks  OBJECTIVE:  Note: Objective measures were completed at Evaluation unless otherwise noted.  HAND DOMINANCE: Right  ADLs:  Overall ADLs: assist provided from paid caregiver and sister Transfers/ambulation related to ADLs: sit to stand lift for all transfers, set up for sliding board in/out of bed  Eating: indep  Grooming: modified indep (wc level in mom's bathroom)  UB Dressing: set up (can't access her bedroom closet)  LB Dressing: able to sit on side of bed to don all LB clothing; assist to hike pants/underwear Toileting: assist with clothing management d/t standing within electric lift Bathing: bed bath with sister helping to wash backside Tub Shower transfers: N/A; pt has walk in shower in mom's bathroom (wc does fit in this bathroom but pt reports inability to transfer from wc and requires arm rests on a bench for a successful transfer attempt from any DME).  Tub bench is in pt's bathroom, but lift does not fit through bathroom doorway and pt reports inability to transfer up from tub bench (currently unable to manage either transfer)   Equipment: see above  IADLs: Shopping: [employer] for shopping Light housekeeping: modified indep from wc level Meal Prep: modified indep from wc level Community mobility: relies on community transportation or family members Medication management: indep Landscape architect: indep Handwriting: NT; pt denies any FMC challenges  MOBILITY STATUS: Hx of falls  POSTURE COMMENTS:  Rounded shoulders, forward head, anterior pelvic tilt, and weight shift left   ACTIVITY TOLERANCE: Activity tolerance: Per PT; currently standing 30-60 sec within Light Gait harness/lift; currently non-ambulatory  UPPER EXTREMITY ROM:  BUEs  WFL  UPPER EXTREMITY MMT:     MMT Right eval Left eval  Shoulder flexion 4+ 4  Shoulder abduction 4+ 4  Shoulder adduction    Shoulder extension    Shoulder internal rotation 4+ 4+  Shoulder external rotation 4 4  Middle trapezius    Lower trapezius    Elbow flexion 4+ 4+  Elbow extension 4+ 4+  Wrist flexion 4+ 4+  Wrist extension 4+ 4+  Wrist ulnar deviation    Wrist radial deviation    Wrist pronation    Wrist supination    (Blank rows = not tested)  HAND FUNCTION/COORDINATION:  R/L WNL; pt denies any coordination deficits/ able to manipulate pills/clothing fasteners/etc without difficulty  11/15/23: *Measures taken d/t PT note revealing pt with increased difficulty manipulating smaller ADL supplies during reaching activities in PT session   11/15/23: Grip strength: R: 70 lbs, L: 63 lbs Pinch strength: Lateral: R: 16 lbs, L: 15 lbs; 3 point pinch: R: 12 lbs, L: 13 lbs 9 hole peg test: Right: 25 sec, L 49 sec   12/25/23: Grip strength: Right: 70 lbs; Left: 65 lbs  9 hole peg test: Right: 24 sec; Left: 3 trials: 54 sec, 1 min, 56 sec  SENSATION: WFL  EDEMA: No visible edema in BUEs  MUSCLE TONE: BUEs WNL  COGNITION: Overall cognitive status: Within functional limits for tasks assessed  VISION: wears glasses all the time, no reports of diplopia  PERCEPTION: Not tested  PRAXIS: WFL  OBSERVATIONS:  Pt pleasant, cooperative, and motivated to work towards improving indep with ADLs.                                                                                                     TREATMENT DATE: 12/27/23 Self Care: -Reps of partial STS from wc level, OT providing min A to block knees and min vc for anterior WS -Trials of STS from raised mat table with use of Sara Lift, reaching toward support bar with the R but not yet the LUE d/t lacking sufficient anterior WS.  Pt able to achieve ~50-75% of erect standing within lift, so not yet able to have the buttocks supports  flipped behind her; mod vc for sequencing/hand placement/WS  Therapeutic Activity: -L [facility] training: Facilitated L FMC and dexterity skills, including small item pick up, storage, and translatory skills working to  pick up small beads (simulated pills) and place 1 by 1 into pill bottle.  Pt practiced scooping a handful from table top level and discarding from palm into pill bottle.   PATIENT EDUCATION: Education details: transfer training Person educated: Patient Education method: Explanation, vc, demo Education comprehension: verbalized understanding, demonstrated understanding, further training needed  HOME EXERCISE PROGRAM: IR A/AAROM to promote shoulder flexibility for peri care/bathing  GOALS: Goals reviewed with patient? Yes  SHORT TERM GOALS: Target date: 12/25/23  Pt will perform bed bath with set up only. Baseline: Eval: Min-mod A for posterior washing; 09/28/23: Min A for posterior washing; 11/13/23: Able to manage bed bath but sister helps with thoroughness, per pt's preference; pt in agreement to trial bath sitting EOB or in wc before next OT session. Goal status: achieved/d/c  2.  Pt will utilize sliding board for wc<>drop arm commode transfer with SBA. Baseline: Eval: Currently using electric lift for transfer to [facility]; 09/28/23: Not yet completed at home but transfers to commode have been easier without sliding board d/t pt implementing a scoot pivot. Goal status: d/c (pt does not prefer sliding board for commode transfer)  3.  Pt will be indep to perform HEP for maintaining BUE strength for ADLs and functional transfers. Baseline: Eval: HEP not yet initiated; 09/28/23: Pt is working on core stability exercises from wc, cane stretches for bilat shoulder IR, and tricep extension via wc pushups; 11/13/23: indep Goal status: achieved  LONG TERM GOALS: Target date: 02/05/24  Pt will perform sink bath with set up A. (Revised on 11/13/23 from min A to set up) Baseline: Eval: Currently bed  bathing d/t inability to stand at sink without electric lift (lift does not fit into pt's bathroom); 09/28/23: still completing bed bath as pt is not yet able to stand; 11/13/23: Not yet attempted; pt encouraged to attempt before next OT session now that bed bath goal has been met; 12/20/23: Pt is now completing sponge bath with min A while seated EOB (caregiver assists with washing peri area d/t no bed rail to aid in R lateral lean) Goal status: ongoing   2.  Pt will perform wc<>tub bench transfer with min A. Baseline: Eval: Unable; 09/28/23: Performed in OT clinic with min A, but not yet in the home Goal status: d/c (pt prefers sink bath)  3.  Pt will perform squat pivot transfer wc<>BSC with SBA.  Baseline: Eval: Eval: Currently using electric lift for transfer to [facility]; 09/28/23: Not yet completed in clinic with [facility], and using lift for [facility] in the home still; 11/13/23: Still using Camie lift at home and in clinic transfers are all scoot rather than squat pivots d/t LE weakness; 12/20/23: Pt reports that she plans to use her Camie lift until she can ambulate to her [facility] d/t limited space for wc in her desired location for [facility] at home. Goal status: d/c  4. Pt will perform scoot transfer wc<>toilet using grab bars with supv to allow toileting in community setting.  Baseline: Eval: Pt limits community outings d/t requiring use of lift for [facility] transfers; 09/28/23: Pt has performed 2 successful scoot pivot  Transfers to toilet in OT clinic with min A, extra time.  Further trials with clothing management on toilet needed, but pt can lower and hike  pants while sititng edge of mat via lateral leaning and min A to maintain core stability; 11/13/23: Pt performs with CGA and extra time; 12/19/23: Pt   performs with close supv on most attempts,  occasional CGA; efficiency is improving  Goal status: ongoing  5.  Pt will complete seated clothing management with min A to enable voiding in handicapped stall within  community. Baseline: Recert 11/13/23: Pt can transfer to toilet with increased time and effort, but has not yet attempted clothing management or voiding while seated on commode in community setting.  Pt can lower pants to knees while seated in wc, but requires mod A to hike over hips from seated position; 12/20/23: Pt fully lowers panties and shorts while seated on commode with supv, can hike clothing to her her upper thighs, transfers back to wc, and then performs a tricep extension from wc level to allow caregiver to hike clothing remainder of the way for purposes of EC (min A for clothing management component) Goal status: achieved  6.  Pt will tolerate standing x1 min with close supv and BUE support to manage clothing in prep for toileting. Baseline: Eval: Currently standing in electric lift only, or within parallel bars without lift; 09/28/23: Not yet attempted; 11/13/23: Pt is able to perform static standing with heavy BUE support on parallel bars with PT; 12/19/22: Not attempted recently d/t new intermittent clonus in BLEs  Goal status: in progress  7.  Pt will increase bilat grip strength by (TBD) in order to securely grasp walker sufficiently for functional transfers and ambulation.  Baseline: Recert 11/13/23: TBD (PT note read following OT session, indicating pt with difficulty securing items in hand);  11/15/23: Grip strength: R: 70 lbs, L: 63 lbs; 12/25/23: R 70 lbs, L 65 lbs  Goal status: in progress  8.  Pt will increase bilat [facility] skills in order to manipulate small ADL supplies with reduced dropping as indicated by (TBD) improvement in 9 hole peg test. Baseline: Recert 11/13/23: 9 hole peg test TBD (PT note read following OT session, indicating pt with difficulty manipulating and securing items in hands); 11/15/23: Right: 25 sec, L 49 sec; 12/25/23: Right: 24 sec; Left: 3 trials: 54 sec, 1 min, 56 sec  Goal status: in progress  ASSESSMENT: CLINICAL IMPRESSION: Pt continues to work towards static  and dynamic standing to progress ADLs from wc level to walker level.  Trials of STS from raised mat table with use of Sara Lift, reaching toward support bar with the R but not yet the LUE d/t lacking sufficient anterior WS.  Pt able to achieve ~50-75% of erect standing within lift, so not yet able to have the buttocks supports flipped behind her; mod vc for sequencing/hand placement/WS.  Continued focus on L hand FMC; pt required non-skid mat for successful bead pick up, and experienced frequent dropping of pills with attempts at pick up, storage, and translatory skills.  Pt will continue to benefit from skilled OT to work towards above noted goals in OT poc, working to maximize indep with daily tasks while reducing burden of care on caregivers.   PERFORMANCE DEFICITS: in functional skills including ADLs, IADLs, strength, pain, flexibility, Gross motor control, mobility, balance, body mechanics, endurance, and decreased knowledge of use of DME, and psychosocial skills including coping strategies, environmental adaptation, habits, and routines and behaviors.   IMPAIRMENTS: are limiting patient from ADLs, IADLs, and social participation.   CO-MORBIDITIES: has co-morbidities such as neuropathy, back pain, obesity, MS, HTN that affects occupational performance. Patient will benefit from skilled OT to address above impairments and improve overall function.  MODIFICATION OR ASSISTANCE TO COMPLETE EVALUATION: No modification of tasks or assist necessary to complete an evaluation.  OT  OCCUPATIONAL PROFILE AND HISTORY: Detailed assessment: Review of records and additional review of physical, cognitive, psychosocial history related to current functional performance.  CLINICAL DECISION MAKING: Moderate - several treatment options, min-mod task modification necessary  REHAB POTENTIAL: Good  EVALUATION COMPLEXITY: Moderate  PLAN:  OT FREQUENCY: 2x/week  OT DURATION: 12 weeks  PLANNED INTERVENTIONS: 97168  OT Re-evaluation, 97535 self care/ADL training, 02889 therapeutic exercise, 97530 therapeutic activity, 97112 neuromuscular re-education, 97140 manual therapy, 97116 gait training, 02989 moist heat, 97010 cryotherapy, balance training, functional mobility training, psychosocial skills training, energy conservation, coping strategies training, patient/family education, and DME and/or AE instructions  RECOMMENDED OTHER SERVICES: None at this time (Pt currently receiving PT services in this clinic)  CONSULTED AND AGREED WITH PLAN OF CARE: Patient  PLAN FOR NEXT SESSION: see above  Inocente Blazing, MS, OTR/L  Inocente MARLA Blazing, OT 12/29/2023, 9:03 PM

## 2023-12-31 NOTE — Therapy (Signed)
 OUTPATIENT PHYSICAL THERAPY NEURO TREATMENT   Patient Name: Lisa Williams MRN: [id number] DOB:09-04-1960, 63 y.o., female Today's Date: 01/02/2024   PCP: Lenon Na  REFERRING PROVIDER: Lenon Na  END OF SESSION:  PT End of Session - 01/01/24 2043     Visit Number 47    Number of Visits 70    Date for PT Re-Evaluation 03/20/24   corrected   Progress Note Due on Visit 50    PT Start Time 1145    PT Stop Time 1232    PT Time Calculation (min) 47 min    Equipment Utilized During Treatment Gait belt    Activity Tolerance Patient tolerated treatment well;No increased pain    Behavior During Therapy [facility] for tasks assessed/performed                Past Medical History:  Diagnosis Date   Abdominal pain, right upper quadrant    Back pain    Calculus of kidney 12/09/2013   Chronic back pain    unspecified   Chronic left shoulder pain 07/19/2015   Complication of anesthesia    Functional disorder of bladder    other   Galactorrhea 11/26/2014   Chronic    Hereditary and idiopathic neuropathy 08/19/2013   History of kidney stones    HPV test positive    Hypercholesteremia 08/19/2013   Hypertension    Incomplete bladder emptying    Microscopic hematuria    MS (multiple sclerosis) (HCC)    Muscle spasticity 05/21/2014   Nonspecific findings on examination of urine    other   Osteopenia    PONV (postoperative nausea and vomiting)    Status post laparoscopic supracervical hysterectomy 11/26/2014   Tobacco user 11/26/2014   Wrist fracture    Past Surgical History:  Procedure Laterality Date   bilateral tubal ligation  1996   BREAST CYST EXCISION Left 2002   CYST EXCISION Left 05/10/2022   Procedure: CYST REMOVAL;  Surgeon: Rodolph Romano, MD;  Location: [facility] ORS;  Service: General;  Laterality: Left;   FRACTURE SURGERY     KNEE SURGERY     right   LAPAROSCOPIC SUPRACERVICAL HYSTERECTOMY  08/05/2013   ORIF WRIST FRACTURE  Left 01/17/2017   Procedure: OPEN REDUCTION INTERNAL FIXATION (ORIF) WRIST FRACTURE;  Surgeon: Leora Lynwood SAUNDERS, MD;  Location: [facility] OR[facility];  Service: Orthopedics;  Laterality: Left;   RADIOLOGY WITH ANESTHESIA N/A 03/18/2020   Procedure: MRI WITH ANESTHESIA CERVICAL SPINE AND BRAIN  WITH AND WITHOUT CONTRAST;  Surgeon: Radiologist, Medication, MD;  Location: [facility] OR;  Service: Radiology;  Laterality: N/A;   TUBAL LIGATION Bilateral    VAGINAL HYSTERECTOMY  03/2006   Patient Active Problem List   Diagnosis Date Noted   Abnormal LFTs (liver function tests) 09/17/2023   Sinus tachycardia 07/07/2023   Aortic atherosclerosis (HCC) 01/18/2023   Prediabetes 01/18/2023   Hypokalemia 07/29/2022   Weakness of both lower extremities 07/29/2022   Abscess of left groin 11/15/2021   Abnormal LFTs 11/15/2021   Acute respiratory disease due to COVID-19 virus 11/21/2020   Weakness    Hypoalbuminemia due to protein-calorie malnutrition [facility])    Neurogenic bowel    Neurogenic bladder    Labile blood pressure    Neuropathic pain    Multiple sclerosis, relapsing-remitting (HCC) 07/09/2019   Abscess of female pelvis    SVT (supraventricular tachycardia) (HCC)    Radial styloid tenosynovitis 03/12/2018   Wheelchair dependence 02/27/2018   Localized osteoporosis with current pathological fracture with routine  healing 01/19/2017   Wrist fracture 01/16/2017   Sprain of ankle 03/23/2016   Closed fracture of lateral malleolus 03/16/2016   Health care maintenance 01/24/2016   Blood pressure elevated without history of HTN 10/25/2015   Essential hypertension 10/25/2015   Multiple sclerosis (HCC) 10/02/2015   Chronic left shoulder pain 07/19/2015   Multiple sclerosis exacerbation (HCC) 07/14/2015   MS (multiple sclerosis) (HCC) 11/26/2014   Increased body mass index 11/26/2014   HPV test positive 11/26/2014   Status post laparoscopic supracervical hysterectomy 11/26/2014   Galactorrhea 11/26/2014   Back ache  05/21/2014   Adiposity 05/21/2014   Disordered sleep 05/21/2014   Muscle spasticity 05/21/2014   Spasticity 05/21/2014   Calculus of kidney 12/09/2013   Renal colic 12/09/2013   Hypercholesteremia 08/19/2013   Hereditary and idiopathic neuropathy 08/19/2013   Hypercholesterolemia without hypertriglyceridemia 08/19/2013   Bladder infection, chronic 07/25/2012   Disorder of bladder function 07/25/2012   Incomplete bladder emptying 07/25/2012   Microscopic hematuria 07/25/2012   Right upper quadrant pain 07/25/2012    ONSET DATE: 1995  REFERRING DIAG: MS  THERAPY DIAG:  Muscle weakness (generalized)  Other lack of coordination  Multiple sclerosis exacerbation (HCC)  Difficulty in walking, not elsewhere classified  Unsteadiness on feet  Abnormality of gait and mobility  Other abnormalities of gait and mobility  Rationale for Evaluation and Treatment: Rehabilitation  SUBJECTIVE:                                                                                                                                                                                             SUBJECTIVE STATEMENT:  Pt reports eager to practice with standing frame today.   Pt accompanied by: self  PERTINENT HISTORY:  Patient is returning to PT s/p hospitalization.  s/p ORIF of R tibia shaft fracture 09/23/2022. Patient has weakness in BLE with RLE>LLE. She drives with hand controls. Patient has been diagnosed with MS in 1995. PMH includes: back pain, CBP, chronic L shoulder pain, galactorrhea, neuropathy, HPV, hypercholestermeia, HTN, MS, osteopenia, PONV, wrist fracture. Additional order for other closed fracture of proximal end of R tibia with routine healing. Still has to use [employer].   PAIN:  Are you having pain? Occasional pain in RLE; primarily in knee  PRECAUTIONS: Fall  RED FLAGS: None   WEIGHT BEARING RESTRICTIONS: No  FALLS: Has patient fallen in last 6 months? No  LIVING  ENVIRONMENT: Lives with: lives with their family Lives in: House/apartment Stairs: ramp Has following equipment at home: Vannie - 2 wheeled, Wheelchair (manual), shower chair, and Grab bars  PLOF: Independent with household mobility with device  PATIENT GOALS: to get her independence back. To be able to get into car, toilet, and get dressed independently  OBJECTIVE:  Note: Objective measures were completed at Evaluation unless otherwise noted.  DIAGNOSTIC FINDINGS: MRI of the brain 03/18/2020 showed multiple T2/FLAIR hyperintense foci in the periventricular, juxtacortical and deep white matter.  There were no infratentorial lesions noted.  None of the foci enhanced.   Due to severe claustrophobia, the study was done with conscious sedation in the hospital   COGNITION: Overall cognitive status: Within functional limits for tasks assessed   SENSATION: Lack of sensation in feet, loss in bilateral lateral aspect of knee  COORDINATION: Does not have the strength for functional LE heel slide test   MUSCLE TONE: BLE mild tone    POSTURE: rounded shoulders, forward head, anterior pelvic tilt, and weight shift left   LOWER EXTREMITY MMT:    MMT Right Eval Left Eval  Hip flexion 0.9 1.5  Hip extension    Hip abduction 1.8 2.6  Hip adduction 3.1 1.9  Hip internal rotation    Hip external rotation    Knee flexion 0.9 1.5  Knee extension 0.8 1.2  Ankle dorsiflexion    Ankle plantarflexion    Ankle inversion    Ankle eversion    (Blank rows = not tested)  BED MOBILITY:  Assess in future session due to limited time  TRANSFERS: Assistive device utilized: Bariatric RW with sit to stands, slide board to table  Sit to stand: unable to reach full stand Stand to sit: unable to reach full stand  Chair to chair: slide board with CGA   GAIT: Unable to ambulate at this time.   FUNCTIONAL TESTS:  Sit to stand: tricep press with BUE; unable to bring arm to walker.    Function In  Sitting Test (FIST)  (1/2 femur on surface; hips/knees flexed to 90deg)   - indicate bed or mat table / step stool if used  SCORING KEY: 4 = Independent (completes task independently & successfully) 3 = Verbal Cues/Increased Time (completes task independently & successfully and only needs more time/cues) 2 = Upper Extremity Support (must use UE for support or assistance to complete successfully) 1 = Needs Assistance (unable to complete w/o physical assist; DOCUMENT LEVEL: min, mod, max) 0 = Dependent (requires complete physical assist; unable to complete successfully even w/ physical assist)  Randomly Administer Once Throughout Exam  4 - Anterior Nudge (superior sternum)  4 - Posterior Nudge (between scapular spines)  4 - Lateral Nudge (to dominant side at acromion)     4 - Static sitting (30 seconds)  4 - Sitting, shake 'no' (left and right)  4 - Sitting, eyes closed (30 seconds)   0 - Sitting, lift foot (dominant side, lift foot 1 inch twice)    2 - Pick up object from behind (object at midline, hands breadth posterior)  3 - Forward reach (use dominant arm, must complete full motion) 2 - Lateral reach (use dominant arm, clear opposite ischial tuberosity) 2 - Pick up object from floor (from between feet)   2 - Posterior scooting (move backwards 2 inches)  2 - Anterior scooting (move forward 2 inches)  2 - Lateral scooting (move to dominant side 2 inches)    TOTAL = 39/56  Notes/comments: slide board tranfer to/from table   MCD > 5 points MCID for IP REHAB > 6 points  Function In Sitting Test (FIST) 08/14/23 (1/2 femur on surface; hips/knees flexed to 90deg)   -  indicate bed or mat table / step stool if used  SCORING KEY: 4 = Independent (completes task independently & successfully) 3 = Verbal Cues/Increased Time (completes task independently & successfully and only needs more time/cues) 2 = Upper Extremity Support (must use UE for support or assistance to complete  successfully) 1 = Needs Assistance (unable to complete w/o physical assist; DOCUMENT LEVEL: min, mod, max) 0 = Dependent (requires complete physical assist; unable to complete successfully even w/ physical assist)  Randomly Administer Once Throughout Exam  4 - Anterior Nudge (superior sternum)  4 - Posterior Nudge (between scapular spines)  4 - Lateral Nudge (to dominant side at acromion)     4 - Static sitting (30 seconds)  4 - Sitting, shake 'no' (left and right)  4 - Sitting, eyes closed (30 seconds)   0 - Sitting, lift foot (dominant side, lift foot 1 inch twice)    4 - Pick up object from behind (object at midline, hands breadth posterior)  4 - Forward reach (use dominant arm, must complete full motion) 4 - Lateral reach (use dominant arm, clear opposite ischial tuberosity)  - Pick up object from floor (from between feet)   3 - Posterior scooting (move backwards 2 inches)  3 - Anterior scooting (move forward 2 inches)  3 - Lateral scooting (move to dominant side 2 inches)    TOTAL = 47/56  MCD > 5 points MCID for IP REHAB > 6 points                                                                                                                              TREATMENT DATE: 01/01/24   Self care/Home management:  *Treatment consisted of trial use of Standing frame today with the assistance of Chyrl Silvan, PTA, [employer]- Tourist information centre manager with [employer]. Patient was assessed with fit to ensure she could safely use device.   Patient was able to wheel herself up to Strap stand frame and required some assist for gluteal pad - to position underneath her.  Once secure- patient was instructed in how to use pneumatic pump handle and proceeded to pump and lift herself up. She was able to make it approx 3/4 up and then received some assist to finish pumping. PT observed her positioning - making sure knees were aligned with knee padding and feet secure- She was able to tolerate  full standing erect posture and remained standing while discussing aspects of machine with PT and technology professional- Chyrl  Some family members arrived later in session and her sister [name] also assisted with learning pneumatic pump to assist patient in standing and later- sitting.          PATIENT EDUCATION: Education details: Pt educated throughout session about proper posture and technique with exercises. Improved exercise technique, movement at target joints, use of target muscles after min to mod verbal, visual, tactile cues.  Person educated: Patient Education method: Explanation, Demonstration, Tactile  cues, and Verbal cues Education comprehension: verbalized understanding, returned demonstration, verbal cues required, tactile cues required, and needs further education  HOME EXERCISE PROGRAM: Stand 3x/day in stander   GOALS: Goals reviewed with patient? Yes  SHORT TERM GOALS: Target date: 08/08/2023  Patient will be independent in home exercise program to improve strength/mobility for better functional independence with ADLs.  Baseline: 4/8: compliance Goal status: MET    LONG TERM GOALS: Target date: 03/20/2024  Patient will tolerate five consecutive stands with UE support from wheelchair to standing to improve functional mobility with additional cushion and RW.  Baseline: unable to perform 4/8:unable to perform full stand 5/20: perform next session 5/29: two full stands with CGA and cushion on her seat 7/31: able to stand 3 times from [facility]. With no addition cushion  in parallel bars. 12/27/2023= Will retest next session- concentrated on just standing on past 2 visits Goal status: Ongoing   2.  Patient will ambulate 10 ft with bRW with wheelchair follow.  Baseline:  unable to ambulate 4/8: unable to ambulate 5/20: able to ambulate length of // bars in previous session 5/27: ambulate 2.5 lengths of // bars with two seated rest breaks with min A for weight shift and  wheelchair follow  7/31: able to ambulate in parallel bars 3 ft with min assist and +2 follow in WC> difficulty advancing the LLE on this day. 12/27/2023- Patient unable to take a step forward today but was abel to take a step on previous visit- limited by clonus like trembling that has progressed over past month. Goal status: Ongoing   3.  Patient will improve FIST score >6 points to demonstrate improved stability and ability to perform ADLs.  Baseline:  3/6: 39/56 4/8: 47/ 56 5/20: 48/56 7/31:46/56 Goal status: MET  4.  Patient will perform toileting with mod I at home for improved independence.  Baseline: 3/6: requires assistance 4/8: requires dependence on machine 5/20: requires assistance with use of wipe buddy.  7/31: continues to require use of sara steady and wipe buddy for safety and hygiene. 12/27/2023- Patient reports that since she is unable to take steps that she continues to require use of sara steady and wipe buddy for safe personal hygiene. Goal status: ongoing   5. Patient will perform > 5 min of static or dynamic standing with BUE and Min/mod assist  for improved LE strength and pre-gait function to assist with toileting and overall standing ability for improve functional independence.  Baseline: 12/27/2023-Patient was able to stand for just over 3 min today. Goal status: New    ASSESSMENT:  CLINICAL IMPRESSION: Treatment was focused on introducing a demo version of standing frame for possible home use. Patient was positioned with her w/c and able to stand using pnuematic pump with min A today. She then performed 20+ min of static standing while PT and rep disscussed the benefits of using the standing frame and closely monitoring her condition - no Lightheadedness or report of pain during session. Reviewed safey with use of equipment with patient and later her sister [name] who arrived to observed as she will be the one to assist patient in proper functioning at home. Pt will  continue to benefit from skilled therapy to address remaining deficits in order to improve overall QoL and return to PLOF.       OBJECTIVE IMPAIRMENTS: Abnormal gait, cardiopulmonary status limiting activity, decreased activity tolerance, decreased balance, decreased coordination, decreased endurance, decreased mobility, difficulty walking, decreased ROM, decreased strength, hypomobility, increased  fascial restrictions, impaired perceived functional ability, impaired flexibility, impaired sensation, improper body mechanics, and postural dysfunction.   ACTIVITY LIMITATIONS: carrying, lifting, bending, sitting, standing, squatting, sleeping, stairs, transfers, bed mobility, continence, bathing, toileting, dressing, self feeding, reach over head, hygiene/grooming, locomotion level, and caring for others  PARTICIPATION LIMITATIONS: meal prep, cleaning, laundry, interpersonal relationship, driving, shopping, community activity, and church  PERSONAL FACTORS: Age, Past/current experiences, Time since onset of injury/illness/exacerbation, Transportation, and 3+ comorbidities: ack pain, CBP, chronic L shoulder pain, galactorrhea, neuropathy, HPV, hypercholestermeia, HTN, MS, osteopenia, PONV, wrist fracture are also affecting patient's functional outcome.   REHAB POTENTIAL: Good  CLINICAL DECISION MAKING: Evolving/moderate complexity  EVALUATION COMPLEXITY: Moderate  PLAN:  PT FREQUENCY: 2x/week  PT DURATION: 12 weeks  PLANNED INTERVENTIONS: 97164- PT Re-evaluation, 97110-Therapeutic exercises, 97530- Therapeutic activity, 97112- Neuromuscular re-education, 97535- Self Care, [id number]- Manual therapy, [phone]- Gait training, [phone]- Orthotic Fit/training, [phone]- Canalith repositioning, [id number]- Aquatic Therapy, [phone]- Electrical stimulation (unattended), [phone]- Electrical stimulation (manual), [id number]- Ultrasound, [id number]- Traction (mechanical), Patient/Family education, Balance training, Stair training, Taping,  Dry Needling, Joint mobilization, Joint manipulation, Spinal manipulation, Spinal mobilization, Scar mobilization, Compression bandaging, Vestibular training, Visual/preceptual remediation/compensation, Cognitive remediation, DME instructions, Cryotherapy, Moist heat, and Biofeedback  PLAN FOR NEXT SESSION:  Sit to stands; try supported walking, continue dynamic core stabilization training and seated reaching tasks to improve weight shifting.    Chyrl London, PT Physical Therapist - [facility]  10:41 AM 01/02/24

## 2024-01-01 ENCOUNTER — Ambulatory Visit

## 2024-01-01 ENCOUNTER — Ambulatory Visit: Payer: PPO

## 2024-01-01 DIAGNOSIS — R262 Difficulty in walking, not elsewhere classified: Secondary | ICD-10-CM

## 2024-01-01 DIAGNOSIS — R278 Other lack of coordination: Secondary | ICD-10-CM

## 2024-01-01 DIAGNOSIS — R2689 Other abnormalities of gait and mobility: Secondary | ICD-10-CM

## 2024-01-01 DIAGNOSIS — G35 Multiple sclerosis: Secondary | ICD-10-CM

## 2024-01-01 DIAGNOSIS — M6281 Muscle weakness (generalized): Secondary | ICD-10-CM | POA: Diagnosis not present

## 2024-01-01 DIAGNOSIS — R2681 Unsteadiness on feet: Secondary | ICD-10-CM

## 2024-01-01 DIAGNOSIS — R269 Unspecified abnormalities of gait and mobility: Secondary | ICD-10-CM

## 2024-01-03 ENCOUNTER — Ambulatory Visit: Payer: PPO

## 2024-01-03 ENCOUNTER — Ambulatory Visit

## 2024-01-03 DIAGNOSIS — G35 Multiple sclerosis: Secondary | ICD-10-CM

## 2024-01-03 DIAGNOSIS — R2681 Unsteadiness on feet: Secondary | ICD-10-CM

## 2024-01-03 DIAGNOSIS — R278 Other lack of coordination: Secondary | ICD-10-CM

## 2024-01-03 DIAGNOSIS — M6281 Muscle weakness (generalized): Secondary | ICD-10-CM | POA: Diagnosis not present

## 2024-01-03 DIAGNOSIS — R2689 Other abnormalities of gait and mobility: Secondary | ICD-10-CM

## 2024-01-03 DIAGNOSIS — R262 Difficulty in walking, not elsewhere classified: Secondary | ICD-10-CM

## 2024-01-03 DIAGNOSIS — R269 Unspecified abnormalities of gait and mobility: Secondary | ICD-10-CM

## 2024-01-03 NOTE — Therapy (Signed)
 OUTPATIENT PHYSICAL THERAPY NEURO TREATMENT   Patient Name: Lisa Williams MRN: [id number] DOB:[birth year], 63 y.o., female Today's Date: 01/03/2024   PCP: Lenon Na  REFERRING PROVIDER: Lenon Na  END OF SESSION:  PT End of Session - 01/03/24 1332     Visit Number 48    Number of Visits 70    Date for PT Re-Evaluation 03/20/24   corrected   Progress Note Due on Visit 50    PT Start Time 1146    PT Stop Time 1225    PT Time Calculation (min) 39 min    Equipment Utilized During Treatment Gait belt    Activity Tolerance Patient tolerated treatment well;No increased pain    Behavior During Therapy [facility] for tasks assessed/performed                Past Medical History:  Diagnosis Date   Abdominal pain, right upper quadrant    Back pain    Calculus of kidney 12/09/2013   Chronic back pain    unspecified   Chronic left shoulder pain 07/19/2015   Complication of anesthesia    Functional disorder of bladder    other   Galactorrhea 11/26/2014   Chronic    Hereditary and idiopathic neuropathy 08/19/2013   History of kidney stones    HPV test positive    Hypercholesteremia 08/19/2013   Hypertension    Incomplete bladder emptying    Microscopic hematuria    MS (multiple sclerosis) (HCC)    Muscle spasticity 05/21/2014   Nonspecific findings on examination of urine    other   Osteopenia    PONV (postoperative nausea and vomiting)    Status post laparoscopic supracervical hysterectomy 11/26/2014   Tobacco user 11/26/2014   Wrist fracture    Past Surgical History:  Procedure Laterality Date   bilateral tubal ligation  1996   BREAST CYST EXCISION Left 2002   CYST EXCISION Left 05/10/2022   Procedure: CYST REMOVAL;  Surgeon: Rodolph Romano, MD;  Location: [facility] ORS;  Service: General;  Laterality: Left;   FRACTURE SURGERY     KNEE SURGERY     right   LAPAROSCOPIC SUPRACERVICAL HYSTERECTOMY  08/05/2013   ORIF WRIST FRACTURE  Left 01/17/2017   Procedure: OPEN REDUCTION INTERNAL FIXATION (ORIF) WRIST FRACTURE;  Surgeon: Leora Lynwood SAUNDERS, MD;  Location: [facility] OR[facility];  Service: Orthopedics;  Laterality: Left;   RADIOLOGY WITH ANESTHESIA N/A 03/18/2020   Procedure: MRI WITH ANESTHESIA CERVICAL SPINE AND BRAIN  WITH AND WITHOUT CONTRAST;  Surgeon: Radiologist, Medication, MD;  Location: [facility] OR;  Service: Radiology;  Laterality: N/A;   TUBAL LIGATION Bilateral    VAGINAL HYSTERECTOMY  03/2006   Patient Active Problem List   Diagnosis Date Noted   Abnormal LFTs (liver function tests) 09/17/2023   Sinus tachycardia 07/07/2023   Aortic atherosclerosis (HCC) 01/18/2023   Prediabetes 01/18/2023   Hypokalemia 07/29/2022   Weakness of both lower extremities 07/29/2022   Abscess of left groin 11/15/2021   Abnormal LFTs 11/15/2021   Acute respiratory disease due to COVID-19 virus 11/21/2020   Weakness    Hypoalbuminemia due to protein-calorie malnutrition [facility])    Neurogenic bowel    Neurogenic bladder    Labile blood pressure    Neuropathic pain    Multiple sclerosis, relapsing-remitting (HCC) 07/09/2019   Abscess of female pelvis    SVT (supraventricular tachycardia) (HCC)    Radial styloid tenosynovitis 03/12/2018   Wheelchair dependence 02/27/2018   Localized osteoporosis with current pathological fracture with routine  healing 01/19/2017   Wrist fracture 01/16/2017   Sprain of ankle 03/23/2016   Closed fracture of lateral malleolus 03/16/2016   Health care maintenance 01/24/2016   Blood pressure elevated without history of HTN 10/25/2015   Essential hypertension 10/25/2015   Multiple sclerosis (HCC) 10/02/2015   Chronic left shoulder pain 07/19/2015   Multiple sclerosis exacerbation (HCC) 07/14/2015   MS (multiple sclerosis) (HCC) 11/26/2014   Increased body mass index 11/26/2014   HPV test positive 11/26/2014   Status post laparoscopic supracervical hysterectomy 11/26/2014   Galactorrhea 11/26/2014   Back ache  05/21/2014   Adiposity 05/21/2014   Disordered sleep 05/21/2014   Muscle spasticity 05/21/2014   Spasticity 05/21/2014   Calculus of kidney 12/09/2013   Renal colic 12/09/2013   Hypercholesteremia 08/19/2013   Hereditary and idiopathic neuropathy 08/19/2013   Hypercholesterolemia without hypertriglyceridemia 08/19/2013   Bladder infection, chronic 07/25/2012   Disorder of bladder function 07/25/2012   Incomplete bladder emptying 07/25/2012   Microscopic hematuria 07/25/2012   Right upper quadrant pain 07/25/2012    ONSET DATE: 1995  REFERRING DIAG: MS  THERAPY DIAG:  Muscle weakness (generalized)  Other lack of coordination  Multiple sclerosis exacerbation (HCC)  Difficulty in walking, not elsewhere classified  Unsteadiness on feet  Abnormality of gait and mobility  Other abnormalities of gait and mobility  Rationale for Evaluation and Treatment: Rehabilitation  SUBJECTIVE:                                                                                                                                                                                             SUBJECTIVE STATEMENT:  Pt reports no soreness after Tuesday's trial of using standing frame.   Pt accompanied by: self  PERTINENT HISTORY:  Patient is returning to PT s/p hospitalization.  s/p ORIF of R tibia shaft fracture 09/23/2022. Patient has weakness in BLE with RLE>LLE. She drives with hand controls. Patient has been diagnosed with MS in 1995. PMH includes: back pain, CBP, chronic L shoulder pain, galactorrhea, neuropathy, HPV, hypercholestermeia, HTN, MS, osteopenia, PONV, wrist fracture. Additional order for other closed fracture of proximal end of R tibia with routine healing. Still has to use [employer].   PAIN:  Are you having pain? Occasional pain in RLE; primarily in knee  PRECAUTIONS: Fall  RED FLAGS: None   WEIGHT BEARING RESTRICTIONS: No  FALLS: Has patient fallen in last 6 months?  No  LIVING ENVIRONMENT: Lives with: lives with their family Lives in: House/apartment Stairs: ramp Has following equipment at home: Vannie - 2 wheeled, Wheelchair (manual), shower chair, and Grab bars  PLOF: Independent with household mobility  with device  PATIENT GOALS: to get her independence back. To be able to get into car, toilet, and get dressed independently  OBJECTIVE:  Note: Objective measures were completed at Evaluation unless otherwise noted.  DIAGNOSTIC FINDINGS: MRI of the brain 03/18/2020 showed multiple T2/FLAIR hyperintense foci in the periventricular, juxtacortical and deep white matter.  There were no infratentorial lesions noted.  None of the foci enhanced.   Due to severe claustrophobia, the study was done with conscious sedation in the hospital   COGNITION: Overall cognitive status: Within functional limits for tasks assessed   SENSATION: Lack of sensation in feet, loss in bilateral lateral aspect of knee  COORDINATION: Does not have the strength for functional LE heel slide test   MUSCLE TONE: BLE mild tone    POSTURE: rounded shoulders, forward head, anterior pelvic tilt, and weight shift left   LOWER EXTREMITY MMT:    MMT Right Eval Left Eval  Hip flexion 0.9 1.5  Hip extension    Hip abduction 1.8 2.6  Hip adduction 3.1 1.9  Hip internal rotation    Hip external rotation    Knee flexion 0.9 1.5  Knee extension 0.8 1.2  Ankle dorsiflexion    Ankle plantarflexion    Ankle inversion    Ankle eversion    (Blank rows = not tested)  BED MOBILITY:  Assess in future session due to limited time  TRANSFERS: Assistive device utilized: Bariatric RW with sit to stands, slide board to table  Sit to stand: unable to reach full stand Stand to sit: unable to reach full stand  Chair to chair: slide board with CGA   GAIT: Unable to ambulate at this time.   FUNCTIONAL TESTS:  Sit to stand: tricep press with BUE; unable to bring arm to walker.     Function In Sitting Test (FIST)  (1/2 femur on surface; hips/knees flexed to 90deg)   - indicate bed or mat table / step stool if used  SCORING KEY: 4 = Independent (completes task independently & successfully) 3 = Verbal Cues/Increased Time (completes task independently & successfully and only needs more time/cues) 2 = Upper Extremity Support (must use UE for support or assistance to complete successfully) 1 = Needs Assistance (unable to complete w/o physical assist; DOCUMENT LEVEL: min, mod, max) 0 = Dependent (requires complete physical assist; unable to complete successfully even w/ physical assist)  Randomly Administer Once Throughout Exam  4 - Anterior Nudge (superior sternum)  4 - Posterior Nudge (between scapular spines)  4 - Lateral Nudge (to dominant side at acromion)     4 - Static sitting (30 seconds)  4 - Sitting, shake 'no' (left and right)  4 - Sitting, eyes closed (30 seconds)   0 - Sitting, lift foot (dominant side, lift foot 1 inch twice)    2 - Pick up object from behind (object at midline, hands breadth posterior)  3 - Forward reach (use dominant arm, must complete full motion) 2 - Lateral reach (use dominant arm, clear opposite ischial tuberosity) 2 - Pick up object from floor (from between feet)   2 - Posterior scooting (move backwards 2 inches)  2 - Anterior scooting (move forward 2 inches)  2 - Lateral scooting (move to dominant side 2 inches)    TOTAL = 39/56  Notes/comments: slide board tranfer to/from table   MCD > 5 points MCID for IP REHAB > 6 points  Function In Sitting Test (FIST) 08/14/23 (1/2 femur on surface; hips/knees flexed to  90deg)   - indicate bed or mat table / step stool if used  SCORING KEY: 4 = Independent (completes task independently & successfully) 3 = Verbal Cues/Increased Time (completes task independently & successfully and only needs more time/cues) 2 = Upper Extremity Support (must use UE for support or assistance to  complete successfully) 1 = Needs Assistance (unable to complete w/o physical assist; DOCUMENT LEVEL: min, mod, max) 0 = Dependent (requires complete physical assist; unable to complete successfully even w/ physical assist)  Randomly Administer Once Throughout Exam  4 - Anterior Nudge (superior sternum)  4 - Posterior Nudge (between scapular spines)  4 - Lateral Nudge (to dominant side at acromion)     4 - Static sitting (30 seconds)  4 - Sitting, shake 'no' (left and right)  4 - Sitting, eyes closed (30 seconds)   0 - Sitting, lift foot (dominant side, lift foot 1 inch twice)    4 - Pick up object from behind (object at midline, hands breadth posterior)  4 - Forward reach (use dominant arm, must complete full motion) 4 - Lateral reach (use dominant arm, clear opposite ischial tuberosity)  - Pick up object from floor (from between feet)   3 - Posterior scooting (move backwards 2 inches)  3 - Anterior scooting (move forward 2 inches)  3 - Lateral scooting (move to dominant side 2 inches)    TOTAL = 47/56  MCD > 5 points MCID for IP REHAB > 6 points                                                                                                                              TREATMENT DATE: 01/01/24   Self care/Home management:  Discussed/Reviewed the standing frame and how the straps, brakes, and pump function.    TA: Standing frame (using the Strap stand)  20 min total- focusing on looking ahead and static stand - patient was able to hold conversation well today- no issues with placement on straps on gluteal region or feet. Patient denied any pain.     PATIENT EDUCATION: Education details: Pt educated throughout session about proper posture and technique with exercises. Improved exercise technique, movement at target joints, use of target muscles after min to mod verbal, visual, tactile cues.  Person educated: Patient Education method: Explanation, Demonstration, Tactile  cues, and Verbal cues Education comprehension: verbalized understanding, returned demonstration, verbal cues required, tactile cues required, and needs further education  HOME EXERCISE PROGRAM: Stand 3x/day in stander   GOALS: Goals reviewed with patient? Yes  SHORT TERM GOALS: Target date: 08/08/2023  Patient will be independent in home exercise program to improve strength/mobility for better functional independence with ADLs.  Baseline: 4/8: compliance Goal status: MET    LONG TERM GOALS: Target date: 03/20/2024  Patient will tolerate five consecutive stands with UE support from wheelchair to standing to improve functional mobility with additional cushion and RW.  Baseline: unable to perform 4/8:unable  to perform full stand 5/20: perform next session 5/29: two full stands with CGA and cushion on her seat 7/31: able to stand 3 times from [facility]. With no addition cushion  in parallel bars. 12/27/2023= Will retest next session- concentrated on just standing on past 2 visits Goal status: Ongoing   2.  Patient will ambulate 10 ft with bRW with wheelchair follow.  Baseline:  unable to ambulate 4/8: unable to ambulate 5/20: able to ambulate length of // bars in previous session 5/27: ambulate 2.5 lengths of // bars with two seated rest breaks with min A for weight shift and wheelchair follow  7/31: able to ambulate in parallel bars 3 ft with min assist and +2 follow in WC> difficulty advancing the LLE on this day. 12/27/2023- Patient unable to take a step forward today but was abel to take a step on previous visit- limited by clonus like trembling that has progressed over past month. Goal status: Ongoing   3.  Patient will improve FIST score >6 points to demonstrate improved stability and ability to perform ADLs.  Baseline:  3/6: 39/56 4/8: 47/ 56 5/20: 48/56 7/31:46/56 Goal status: MET  4.  Patient will perform toileting with mod I at home for improved independence.  Baseline: 3/6: requires  assistance 4/8: requires dependence on machine 5/20: requires assistance with use of wipe buddy.  7/31: continues to require use of sara steady and wipe buddy for safety and hygiene. 12/27/2023- Patient reports that since she is unable to take steps that she continues to require use of sara steady and wipe buddy for safe personal hygiene. Goal status: ongoing   5. Patient will perform > 5 min of static or dynamic standing with BUE and Min/mod assist  for improved LE strength and pre-gait function to assist with toileting and overall standing ability for improve functional independence.  Baseline: 12/27/2023-Patient was able to stand for just over 3 min today. Goal status: New    ASSESSMENT:  CLINICAL IMPRESSION: Patient performed another trial use of strap stand standing frame. She was able to position well and no issues with standing her today. She did not exhibit any clonus or issues with standing. She wll benefit from more dynamic activities with standing next visit. Pt will continue to benefit from skilled therapy to address remaining deficits in order to improve overall QoL and return to PLOF.       OBJECTIVE IMPAIRMENTS: Abnormal gait, cardiopulmonary status limiting activity, decreased activity tolerance, decreased balance, decreased coordination, decreased endurance, decreased mobility, difficulty walking, decreased ROM, decreased strength, hypomobility, increased fascial restrictions, impaired perceived functional ability, impaired flexibility, impaired sensation, improper body mechanics, and postural dysfunction.   ACTIVITY LIMITATIONS: carrying, lifting, bending, sitting, standing, squatting, sleeping, stairs, transfers, bed mobility, continence, bathing, toileting, dressing, self feeding, reach over head, hygiene/grooming, locomotion level, and caring for others  PARTICIPATION LIMITATIONS: meal prep, cleaning, laundry, interpersonal relationship, driving, shopping, community activity,  and church  PERSONAL FACTORS: Age, Past/current experiences, Time since onset of injury/illness/exacerbation, Transportation, and 3+ comorbidities: ack pain, CBP, chronic L shoulder pain, galactorrhea, neuropathy, HPV, hypercholestermeia, HTN, MS, osteopenia, PONV, wrist fracture are also affecting patient's functional outcome.   REHAB POTENTIAL: Good  CLINICAL DECISION MAKING: Evolving/moderate complexity  EVALUATION COMPLEXITY: Moderate  PLAN:  PT FREQUENCY: 2x/week  PT DURATION: 12 weeks  PLANNED INTERVENTIONS: 97164- PT Re-evaluation, 97110-Therapeutic exercises, 97530- Therapeutic activity, [id number]- Neuromuscular re-education, 97535- Self Care, [id number]- Manual therapy, [id number]- Gait training, [phone]- Orthotic Fit/training, [phone]- Canalith repositioning, [id number]- Aquatic Therapy, [id number]-  Electrical stimulation (unattended), [id number]- Electrical stimulation (manual), [id number]- Ultrasound, [id number]- Traction (mechanical), Patient/Family education, Balance training, Stair training, Taping, Dry Needling, Joint mobilization, Joint manipulation, Spinal manipulation, Spinal mobilization, Scar mobilization, Compression bandaging, Vestibular training, Visual/preceptual remediation/compensation, Cognitive remediation, DME instructions, Cryotherapy, Moist heat, and Biofeedback  PLAN FOR NEXT SESSION: Strap stand frame with more dynamic activities Sit to stands; try supported walking, continue dynamic core stabilization training and seated reaching tasks to improve weight shifting.    Chyrl London, PT Physical Therapist - [facility]  1:49 PM 01/03/24

## 2024-01-04 NOTE — Therapy (Signed)
 OUTPATIENT OCCUPATIONAL THERAPY NEURO TREATMENT NOTE  Patient Name: Lisa Williams MRN: [id number] DOB:1961/01/18, 63 y.o., female Today's Date: 01/04/2024  PCP: Dr. Layman Piety   Neurologist Dr. Suanne at [facility] REFERRING PROVIDER: Dr. Layman Piety    END OF SESSION:  OT End of Session - 01/04/24 1922     Visit Number 33    Number of Visits 44    Date for OT Re-Evaluation 02/05/24    Authorization Time Period Reporting period beginning 12/20/23    Progress Note Due on Visit 40    OT Start Time 1100    OT Stop Time 1145    OT Time Calculation (min) 45 min    Equipment Utilized During Treatment manual wc    Activity Tolerance Patient tolerated treatment well    Behavior During Therapy [facility] for tasks assessed/performed         Past Medical History:  Diagnosis Date   Abdominal pain, right upper quadrant    Back pain    Calculus of kidney 12/09/2013   Chronic back pain    unspecified   Chronic left shoulder pain 07/19/2015   Complication of anesthesia    Functional disorder of bladder    other   Galactorrhea 11/26/2014   Chronic    Hereditary and idiopathic neuropathy 08/19/2013   History of kidney stones    HPV test positive    Hypercholesteremia 08/19/2013   Hypertension    Incomplete bladder emptying    Microscopic hematuria    MS (multiple sclerosis) (HCC)    Muscle spasticity 05/21/2014   Nonspecific findings on examination of urine    other   Osteopenia    PONV (postoperative nausea and vomiting)    Status post laparoscopic supracervical hysterectomy 11/26/2014   Tobacco user 11/26/2014   Wrist fracture    Past Surgical History:  Procedure Laterality Date   bilateral tubal ligation  1996   BREAST CYST EXCISION Left 2002   CYST EXCISION Left 05/10/2022   Procedure: CYST REMOVAL;  Surgeon: Rodolph Romano, MD;  Location: [facility] ORS;  Service: General;  Laterality: Left;   FRACTURE SURGERY     KNEE SURGERY     right   LAPAROSCOPIC  SUPRACERVICAL HYSTERECTOMY  08/05/2013   ORIF WRIST FRACTURE Left 01/17/2017   Procedure: OPEN REDUCTION INTERNAL FIXATION (ORIF) WRIST FRACTURE;  Surgeon: Leora Lynwood SAUNDERS, MD;  Location: [facility] ORS;  Service: Orthopedics;  Laterality: Left;   RADIOLOGY WITH ANESTHESIA N/A 03/18/2020   Procedure: MRI WITH ANESTHESIA CERVICAL SPINE AND BRAIN  WITH AND WITHOUT CONTRAST;  Surgeon: Radiologist, Medication, MD;  Location: [facility] OR;  Service: Radiology;  Laterality: N/A;   TUBAL LIGATION Bilateral    VAGINAL HYSTERECTOMY  03/2006   Patient Active Problem List   Diagnosis Date Noted   Abnormal LFTs (liver function tests) 09/17/2023   Sinus tachycardia 07/07/2023   Aortic atherosclerosis (HCC) 01/18/2023   Prediabetes 01/18/2023   Hypokalemia 07/29/2022   Weakness of both lower extremities 07/29/2022   Abscess of left groin 11/15/2021   Abnormal LFTs 11/15/2021   Acute respiratory disease due to COVID-19 virus 11/21/2020   Weakness    Hypoalbuminemia due to protein-calorie malnutrition [facility])    Neurogenic bowel    Neurogenic bladder    Labile blood pressure    Neuropathic pain    Multiple sclerosis, relapsing-remitting (HCC) 07/09/2019   Abscess of female pelvis    SVT (supraventricular tachycardia) (HCC)    Radial styloid tenosynovitis 03/12/2018   Wheelchair dependence 02/27/2018  Localized osteoporosis with current pathological fracture with routine healing 01/19/2017   Wrist fracture 01/16/2017   Sprain of ankle 03/23/2016   Closed fracture of lateral malleolus 03/16/2016   Health care maintenance 01/24/2016   Blood pressure elevated without history of HTN 10/25/2015   Essential hypertension 10/25/2015   Multiple sclerosis (HCC) 10/02/2015   Chronic left shoulder pain 07/19/2015   Multiple sclerosis exacerbation (HCC) 07/14/2015   MS (multiple sclerosis) (HCC) 11/26/2014   Increased body mass index 11/26/2014   HPV test positive 11/26/2014   Status post laparoscopic supracervical  hysterectomy 11/26/2014   Galactorrhea 11/26/2014   Back ache 05/21/2014   Adiposity 05/21/2014   Disordered sleep 05/21/2014   Muscle spasticity 05/21/2014   Spasticity 05/21/2014   Calculus of kidney 12/09/2013   Renal colic 12/09/2013   Hypercholesteremia 08/19/2013   Hereditary and idiopathic neuropathy 08/19/2013   Hypercholesterolemia without hypertriglyceridemia 08/19/2013   Bladder infection, chronic 07/25/2012   Disorder of bladder function 07/25/2012   Incomplete bladder emptying 07/25/2012   Microscopic hematuria 07/25/2012   Right upper quadrant pain 07/25/2012   ONSET DATE: 5/24 Amon and broke R leg which caused beginning of ADL decline; diagnosed with MS in 1995)  REFERRING DIAG: MS  THERAPY DIAG:  Muscle weakness (generalized)  Other lack of coordination  Multiple sclerosis exacerbation (HCC)  Rationale for Evaluation and Treatment: Rehabilitation  SUBJECTIVE:  SUBJECTIVE STATEMENT: Pt reports wanting to save her legs for her PT session d/t pt planning a standing frame trial with DME co after OT session.  Pt accompanied by: self  PERTINENT HISTORY: Pt reports increased difficulty with basic self care tasks since breaking her R leg last May.  Since that time, mobility and ADLs have declined, with pt requiring assist from private caregivers and family to manage bathing, toileting, dressing, and functional transfers.  PRECAUTIONS: Fall  WEIGHT BEARING RESTRICTIONS: No  PAIN: 01/01/24: No pain  Are you having pain? No; occasional pain in R lower back, but not today  FALLS: Has patient fallen in last 6 months? No Last fall was May 17 of 2024  LIVING ENVIRONMENT: Lives with: lives with their family (including mother who has dementia and sister [name])  Lives in: 2 level home but pt resides on main level Stairs: ramp  Has following equipment at home: Wheelchair (manual), Shower bench, and hand held shower shower, 1 grab bar in the shower, 3in1 commode, sit  to stand lift (electric), FWW, hospital bed  PLOF: modified indep with ADL/IADLs prior to May of 7975   PATIENT GOALS: Increase independence with basic self care tasks  OBJECTIVE:  Note: Objective measures were completed at Evaluation unless otherwise noted.  HAND DOMINANCE: Right  ADLs:  Overall ADLs: assist provided from paid caregiver and sister Transfers/ambulation related to ADLs: sit to stand lift for all transfers, set up for sliding board in/out of bed  Eating: indep  Grooming: modified indep (wc level in mom's bathroom)  UB Dressing: set up (can't access her bedroom closet)  LB Dressing: able to sit on side of bed to don all LB clothing; assist to hike pants/underwear Toileting: assist with clothing management d/t standing within electric lift Bathing: bed bath with sister helping to wash backside Tub Shower transfers: N/A; pt has walk in shower in mom's bathroom (wc does fit in this bathroom but pt reports inability to transfer from wc and requires arm rests on a bench for a successful transfer attempt from any DME).  Tub bench is in pt's bathroom, but  lift does not fit through bathroom doorway and pt reports inability to transfer up from tub bench (currently unable to manage either transfer)   Equipment: see above  IADLs: Shopping: [employer] for shopping Light housekeeping: modified indep from wc level Meal Prep: modified indep from wc level Community mobility: relies on community transportation or family members Medication management: indep Landscape architect: indep Handwriting: NT; pt denies any FMC challenges  MOBILITY STATUS: Hx of falls  POSTURE COMMENTS:  Rounded shoulders, forward head, anterior pelvic tilt, and weight shift left   ACTIVITY TOLERANCE: Activity tolerance: Per PT; currently standing 30-60 sec within Light Gait harness/lift; currently non-ambulatory  UPPER EXTREMITY ROM:  BUEs WFL  UPPER EXTREMITY MMT:     MMT Right eval Left eval   Shoulder flexion 4+ 4  Shoulder abduction 4+ 4  Shoulder adduction    Shoulder extension    Shoulder internal rotation 4+ 4+  Shoulder external rotation 4 4  Middle trapezius    Lower trapezius    Elbow flexion 4+ 4+  Elbow extension 4+ 4+  Wrist flexion 4+ 4+  Wrist extension 4+ 4+  Wrist ulnar deviation    Wrist radial deviation    Wrist pronation    Wrist supination    (Blank rows = not tested)  HAND FUNCTION/COORDINATION:  R/L WNL; pt denies any coordination deficits/ able to manipulate pills/clothing fasteners/etc without difficulty  11/15/23: *Measures taken d/t PT note revealing pt with increased difficulty manipulating smaller ADL supplies during reaching activities in PT session   11/15/23: Grip strength: R: 70 lbs, L: 63 lbs Pinch strength: Lateral: R: 16 lbs, L: 15 lbs; 3 point pinch: R: 12 lbs, L: 13 lbs 9 hole peg test: Right: 25 sec, L 49 sec   12/25/23: Grip strength: Right: 70 lbs; Left: 65 lbs  9 hole peg test: Right: 24 sec; Left: 3 trials: 54 sec, 1 min, 56 sec  SENSATION: WFL  EDEMA: No visible edema in BUEs  MUSCLE TONE: BUEs WNL  COGNITION: Overall cognitive status: Within functional limits for tasks assessed  VISION: wears glasses all the time, no reports of diplopia  PERCEPTION: Not tested  PRAXIS: WFL  OBSERVATIONS:  Pt pleasant, cooperative, and motivated to work towards improving indep with ADLs.                                                                                                     TREATMENT DATE: 01/01/24 Neuro re-ed: -Facilitated L hand FMC/dexterity skills with focus on item storage, translatory skills using small washers.  Pt practiced picking washers up from magnetic dish and table top surface, storing in hand, and moving 1 by 1 to thumb and IF in prep to discard.   Therapeutic Activity: -Facilitated core stability and L/R FMC/GMC skills, performing forward and lateral reaching patterns to place washers over vertical  dowel.  Pt alternated between R/L sides.  Min vc to limit BUE support from table top with return to midline.  Washers/dowels placed to promote extended reaching R/L across table top with return to midline after each reach.  PATIENT EDUCATION: Education details: Core stability exercises, L/R Fmc/dexterity skills  Person educated: Patient Education method: Explanation, vc, demo Education comprehension: verbalized understanding, demonstrated understanding, further training needed  HOME EXERCISE PROGRAM: IR A/AAROM to promote shoulder flexibility for peri care/bathing  GOALS: Goals reviewed with patient? Yes  SHORT TERM GOALS: Target date: 12/25/23  Pt will perform bed bath with set up only. Baseline: Eval: Min-mod A for posterior washing; 09/28/23: Min A for posterior washing; 11/13/23: Able to manage bed bath but sister helps with thoroughness, per pt's preference; pt in agreement to trial bath sitting EOB or in wc before next OT session. Goal status: achieved/d/c  2.  Pt will utilize sliding board for wc<>drop arm commode transfer with SBA. Baseline: Eval: Currently using electric lift for transfer to [facility]; 09/28/23: Not yet completed at home but transfers to commode have been easier without sliding board d/t pt implementing a scoot pivot. Goal status: d/c (pt does not prefer sliding board for commode transfer)  3.  Pt will be indep to perform HEP for maintaining BUE strength for ADLs and functional transfers. Baseline: Eval: HEP not yet initiated; 09/28/23: Pt is working on core stability exercises from wc, cane stretches for bilat shoulder IR, and tricep extension via wc pushups; 11/13/23: indep Goal status: achieved  LONG TERM GOALS: Target date: 02/05/24  Pt will perform sink bath with set up A. (Revised on 11/13/23 from min A to set up) Baseline: Eval: Currently bed bathing d/t inability to stand at sink without electric lift (lift does not fit into pt's bathroom); 09/28/23: still completing  bed bath as pt is not yet able to stand; 11/13/23: Not yet attempted; pt encouraged to attempt before next OT session now that bed bath goal has been met; 12/20/23: Pt is now completing sponge bath with min A while seated EOB (caregiver assists with washing peri area d/t no bed rail to aid in R lateral lean) Goal status: ongoing   2.  Pt will perform wc<>tub bench transfer with min A. Baseline: Eval: Unable; 09/28/23: Performed in OT clinic with min A, but not yet in the home Goal status: d/c (pt prefers sink bath)  3.  Pt will perform squat pivot transfer wc<>BSC with SBA.  Baseline: Eval: Eval: Currently using electric lift for transfer to [facility]; 09/28/23: Not yet completed in clinic with [facility], and using lift for [facility] in the home still; 11/13/23: Still using Camie lift at home and in clinic transfers are all scoot rather than squat pivots d/t LE weakness; 12/20/23: Pt reports that she plans to use her Camie lift until she can ambulate to her [facility] d/t limited space for wc in her desired location for [facility] at home. Goal status: d/c  4. Pt will perform scoot transfer wc<>toilet using grab bars with supv to allow toileting in community setting.  Baseline: Eval: Pt limits community outings d/t requiring use of lift for [facility] transfers; 09/28/23: Pt has performed 2 successful scoot pivot  Transfers to toilet in OT clinic with min A, extra time.  Further trials with clothing management on toilet needed, but pt can lower and hike  pants while sititng edge of mat via lateral leaning and min A to maintain core stability; 11/13/23: Pt performs with CGA and extra time; 12/19/23: Pt   performs with close supv on most attempts, occasional CGA; efficiency is improving  Goal status: ongoing  5.  Pt will complete seated clothing management with min A to enable voiding in handicapped stall within  community. Baseline: Recert 11/13/23: Pt can transfer to toilet with increased time and effort, but has not yet attempted clothing  management or voiding while seated on commode in community setting.  Pt can lower pants to knees while seated in wc, but requires mod A to hike over hips from seated position; 12/20/23: Pt fully lowers panties and shorts while seated on commode with supv, can hike clothing to her her upper thighs, transfers back to wc, and then performs a tricep extension from wc level to allow caregiver to hike clothing remainder of the way for purposes of EC (min A for clothing management component) Goal status: achieved  6.  Pt will tolerate standing x1 min with close supv and BUE support to manage clothing in prep for toileting. Baseline: Eval: Currently standing in electric lift only, or within parallel bars without lift; 09/28/23: Not yet attempted; 11/13/23: Pt is able to perform static standing with heavy BUE support on parallel bars with PT; 12/19/22: Not attempted recently d/t new intermittent clonus in BLEs  Goal status: in progress  7.  Pt will increase bilat grip strength by (TBD) in order to securely grasp walker sufficiently for functional transfers and ambulation.  Baseline: Recert 11/13/23: TBD (PT note read following OT session, indicating pt with difficulty securing items in hand);  11/15/23: Grip strength: R: 70 lbs, L: 63 lbs; 12/25/23: R 70 lbs, L 65 lbs  Goal status: in progress  8.  Pt will increase bilat [facility] skills in order to manipulate small ADL supplies with reduced dropping as indicated by (TBD) improvement in 9 hole peg test. Baseline: Recert 11/13/23: 9 hole peg test TBD (PT note read following OT session, indicating pt with difficulty manipulating and securing items in hands); 11/15/23: Right: 25 sec, L 49 sec; 12/25/23: Right: 24 sec; Left: 3 trials: 54 sec, 1 min, 56 sec  Goal status: in progress  ASSESSMENT: CLINICAL IMPRESSION: Pt continues to demonstrate improvements in core stability, requiring only occasional UE support to return to midline after an extended R or L forward and lateral  reach.  L hand continues to demonstrate difficulties with small item storage and translatory skills.  Pt will continue to benefit from skilled OT to work towards above noted goals in OT poc, working to maximize indep with daily tasks while reducing burden of care on caregivers.   PERFORMANCE DEFICITS: in functional skills including ADLs, IADLs, strength, pain, flexibility, Gross motor control, mobility, balance, body mechanics, endurance, and decreased knowledge of use of DME, and psychosocial skills including coping strategies, environmental adaptation, habits, and routines and behaviors.   IMPAIRMENTS: are limiting patient from ADLs, IADLs, and social participation.   CO-MORBIDITIES: has co-morbidities such as neuropathy, back pain, obesity, MS, HTN that affects occupational performance. Patient will benefit from skilled OT to address above impairments and improve overall function.  MODIFICATION OR ASSISTANCE TO COMPLETE EVALUATION: No modification of tasks or assist necessary to complete an evaluation.  OT OCCUPATIONAL PROFILE AND HISTORY: Detailed assessment: Review of records and additional review of physical, cognitive, psychosocial history related to current functional performance.  CLINICAL DECISION MAKING: Moderate - several treatment options, min-mod task modification necessary  REHAB POTENTIAL: Good  EVALUATION COMPLEXITY: Moderate  PLAN:  OT FREQUENCY: 2x/week  OT DURATION: 12 weeks  PLANNED INTERVENTIONS: 97168 OT Re-evaluation, 97535 self care/ADL training, 02889 therapeutic exercise, 97530 therapeutic activity, 97112 neuromuscular re-education, 97140 manual therapy, 97116 gait training, 02989 moist heat, 97010 cryotherapy, balance training, functional mobility training, psychosocial skills training, energy conservation,  coping strategies training, patient/family education, and DME and/or AE instructions  RECOMMENDED OTHER SERVICES: None at this time (Pt currently receiving PT  services in this clinic)  CONSULTED AND AGREED WITH PLAN OF CARE: Patient  PLAN FOR NEXT SESSION: see above  Inocente Blazing, MS, OTR/L  Inocente MARLA Blazing, OT 01/04/2024, 7:24 PM

## 2024-01-05 NOTE — Therapy (Signed)
 OUTPATIENT OCCUPATIONAL THERAPY NEURO TREATMENT NOTE  Patient Name: Lisa Williams MRN: [id number] DOB:07/04/1960, 63 y.o., female Today's Date: 01/05/2024  PCP: Dr. Layman Piety   Neurologist Dr. Suanne at [facility] REFERRING PROVIDER: Dr. Layman Piety    END OF SESSION:  OT End of Session - 01/05/24 1104     Visit Number 34    Number of Visits 44    Date for OT Re-Evaluation 02/05/24    Authorization Time Period Reporting period beginning 12/20/23    Progress Note Due on Visit 40    OT Start Time 1100    OT Stop Time 1145    OT Time Calculation (min) 45 min    Equipment Utilized During Treatment manual wc    Activity Tolerance Patient tolerated treatment well    Behavior During Therapy [facility] for tasks assessed/performed         Past Medical History:  Diagnosis Date   Abdominal pain, right upper quadrant    Back pain    Calculus of kidney 12/09/2013   Chronic back pain    unspecified   Chronic left shoulder pain 07/19/2015   Complication of anesthesia    Functional disorder of bladder    other   Galactorrhea 11/26/2014   Chronic    Hereditary and idiopathic neuropathy 08/19/2013   History of kidney stones    HPV test positive    Hypercholesteremia 08/19/2013   Hypertension    Incomplete bladder emptying    Microscopic hematuria    MS (multiple sclerosis) (HCC)    Muscle spasticity 05/21/2014   Nonspecific findings on examination of urine    other   Osteopenia    PONV (postoperative nausea and vomiting)    Status post laparoscopic supracervical hysterectomy 11/26/2014   Tobacco user 11/26/2014   Wrist fracture    Past Surgical History:  Procedure Laterality Date   bilateral tubal ligation  1996   BREAST CYST EXCISION Left 2002   CYST EXCISION Left 05/10/2022   Procedure: CYST REMOVAL;  Surgeon: Rodolph Romano, MD;  Location: [facility] ORS;  Service: General;  Laterality: Left;   FRACTURE SURGERY     KNEE SURGERY     right   LAPAROSCOPIC  SUPRACERVICAL HYSTERECTOMY  08/05/2013   ORIF WRIST FRACTURE Left 01/17/2017   Procedure: OPEN REDUCTION INTERNAL FIXATION (ORIF) WRIST FRACTURE;  Surgeon: Leora Lynwood SAUNDERS, MD;  Location: [facility] ORS;  Service: Orthopedics;  Laterality: Left;   RADIOLOGY WITH ANESTHESIA N/A 03/18/2020   Procedure: MRI WITH ANESTHESIA CERVICAL SPINE AND BRAIN  WITH AND WITHOUT CONTRAST;  Surgeon: Radiologist, Medication, MD;  Location: [facility] OR;  Service: Radiology;  Laterality: N/A;   TUBAL LIGATION Bilateral    VAGINAL HYSTERECTOMY  03/2006   Patient Active Problem List   Diagnosis Date Noted   Abnormal LFTs (liver function tests) 09/17/2023   Sinus tachycardia 07/07/2023   Aortic atherosclerosis (HCC) 01/18/2023   Prediabetes 01/18/2023   Hypokalemia 07/29/2022   Weakness of both lower extremities 07/29/2022   Abscess of left groin 11/15/2021   Abnormal LFTs 11/15/2021   Acute respiratory disease due to COVID-19 virus 11/21/2020   Weakness    Hypoalbuminemia due to protein-calorie malnutrition [facility])    Neurogenic bowel    Neurogenic bladder    Labile blood pressure    Neuropathic pain    Multiple sclerosis, relapsing-remitting (HCC) 07/09/2019   Abscess of female pelvis    SVT (supraventricular tachycardia) (HCC)    Radial styloid tenosynovitis 03/12/2018   Wheelchair dependence 02/27/2018  Localized osteoporosis with current pathological fracture with routine healing 01/19/2017   Wrist fracture 01/16/2017   Sprain of ankle 03/23/2016   Closed fracture of lateral malleolus 03/16/2016   Health care maintenance 01/24/2016   Blood pressure elevated without history of HTN 10/25/2015   Essential hypertension 10/25/2015   Multiple sclerosis (HCC) 10/02/2015   Chronic left shoulder pain 07/19/2015   Multiple sclerosis exacerbation (HCC) 07/14/2015   MS (multiple sclerosis) (HCC) 11/26/2014   Increased body mass index 11/26/2014   HPV test positive 11/26/2014   Status post laparoscopic supracervical  hysterectomy 11/26/2014   Galactorrhea 11/26/2014   Back ache 05/21/2014   Adiposity 05/21/2014   Disordered sleep 05/21/2014   Muscle spasticity 05/21/2014   Spasticity 05/21/2014   Calculus of kidney 12/09/2013   Renal colic 12/09/2013   Hypercholesteremia 08/19/2013   Hereditary and idiopathic neuropathy 08/19/2013   Hypercholesterolemia without hypertriglyceridemia 08/19/2013   Bladder infection, chronic 07/25/2012   Disorder of bladder function 07/25/2012   Incomplete bladder emptying 07/25/2012   Microscopic hematuria 07/25/2012   Right upper quadrant pain 07/25/2012   ONSET DATE: 5/24 Amon and broke R leg which caused beginning of ADL decline; diagnosed with MS in 1995)  REFERRING DIAG: MS  THERAPY DIAG:  Muscle weakness (generalized)  Other lack of coordination  Multiple sclerosis exacerbation (HCC)  Rationale for Evaluation and Treatment: Rehabilitation  SUBJECTIVE:  SUBJECTIVE STATEMENT: Pt reports doing well today and is excited to continue working with the standing frame in PT session today.  Pt accompanied by: self  PERTINENT HISTORY: Pt reports increased difficulty with basic self care tasks since breaking her R leg last May.  Since that time, mobility and ADLs have declined, with pt requiring assist from private caregivers and family to manage bathing, toileting, dressing, and functional transfers.  PRECAUTIONS: Fall  WEIGHT BEARING RESTRICTIONS: No  PAIN: 01/03/24: No pain  Are you having pain? No; occasional pain in R lower back, but not today  FALLS: Has patient fallen in last 6 months? No Last fall was May 17 of 2024  LIVING ENVIRONMENT: Lives with: lives with their family (including mother who has dementia and sister [name])  Lives in: 2 level home but pt resides on main level Stairs: ramp  Has following equipment at home: Wheelchair (manual), Shower bench, and hand held shower shower, 1 grab bar in the shower, 3in1 commode, sit to stand lift  (electric), FWW, hospital bed  PLOF: modified indep with ADL/IADLs prior to May of 7975   PATIENT GOALS: Increase independence with basic self care tasks  OBJECTIVE:  Note: Objective measures were completed at Evaluation unless otherwise noted.  HAND DOMINANCE: Right  ADLs:  Overall ADLs: assist provided from paid caregiver and sister Transfers/ambulation related to ADLs: sit to stand lift for all transfers, set up for sliding board in/out of bed  Eating: indep  Grooming: modified indep (wc level in mom's bathroom)  UB Dressing: set up (can't access her bedroom closet)  LB Dressing: able to sit on side of bed to don all LB clothing; assist to hike pants/underwear Toileting: assist with clothing management d/t standing within electric lift Bathing: bed bath with sister helping to wash backside Tub Shower transfers: N/A; pt has walk in shower in mom's bathroom (wc does fit in this bathroom but pt reports inability to transfer from wc and requires arm rests on a bench for a successful transfer attempt from any DME).  Tub bench is in pt's bathroom, but lift does not fit through  bathroom doorway and pt reports inability to transfer up from tub bench (currently unable to manage either transfer)   Equipment: see above  IADLs: Shopping: [employer] for shopping Light housekeeping: modified indep from wc level Meal Prep: modified indep from wc level Community mobility: relies on community transportation or family members Medication management: indep Landscape architect: indep Handwriting: NT; pt denies any FMC challenges  MOBILITY STATUS: Hx of falls  POSTURE COMMENTS:  Rounded shoulders, forward head, anterior pelvic tilt, and weight shift left   ACTIVITY TOLERANCE: Activity tolerance: Per PT; currently standing 30-60 sec within Light Gait harness/lift; currently non-ambulatory  UPPER EXTREMITY ROM:  BUEs WFL  UPPER EXTREMITY MMT:     MMT Right eval Left eval  Shoulder  flexion 4+ 4  Shoulder abduction 4+ 4  Shoulder adduction    Shoulder extension    Shoulder internal rotation 4+ 4+  Shoulder external rotation 4 4  Middle trapezius    Lower trapezius    Elbow flexion 4+ 4+  Elbow extension 4+ 4+  Wrist flexion 4+ 4+  Wrist extension 4+ 4+  Wrist ulnar deviation    Wrist radial deviation    Wrist pronation    Wrist supination    (Blank rows = not tested)  HAND FUNCTION/COORDINATION:  R/L WNL; pt denies any coordination deficits/ able to manipulate pills/clothing fasteners/etc without difficulty  11/15/23: *Measures taken d/t PT note revealing pt with increased difficulty manipulating smaller ADL supplies during reaching activities in PT session   11/15/23: Grip strength: R: 70 lbs, L: 63 lbs Pinch strength: Lateral: R: 16 lbs, L: 15 lbs; 3 point pinch: R: 12 lbs, L: 13 lbs 9 hole peg test: Right: 25 sec, L 49 sec   12/25/23: Grip strength: Right: 70 lbs; Left: 65 lbs  9 hole peg test: Right: 24 sec; Left: 3 trials: 54 sec, 1 min, 56 sec  SENSATION: WFL  EDEMA: No visible edema in BUEs  MUSCLE TONE: BUEs WNL  COGNITION: Overall cognitive status: Within functional limits for tasks assessed  VISION: wears glasses all the time, no reports of diplopia  PERCEPTION: Not tested  PRAXIS: WFL  OBSERVATIONS:  Pt pleasant, cooperative, and motivated to work towards improving indep with ADLs.                                                                                                     TREATMENT DATE: 01/03/24 Self Care: -Sit to stand prep for ADL transfers: Pt practiced forward weight shift to achieve partial sit to stand from wc level, knees blocked with pillow cushioning edge of chair in front of her (chair stabilized by wall) -OT placed 2.5 foam cushion on top of wc cushion to increase seat height to improve sit to stand attempts, and provided mod vc for increasing forward weight shift and attempts at reaching for chair arm rest in front  of her   Therapeutic Activity: -Facilitated core stability with ball toss/catch, vc for goal of using limited support from back rest of wc after reaching outside BOS and returning to midline.   -Wc arm  rests removed to further challenge core stability when reaching laterally outside BOS to catch ball  PATIENT EDUCATION: Education details: Core stability exercises, STS transfer technique  Person educated: Patient Education method: Explanation, vc, demo Education comprehension: verbalized understanding, demonstrated understanding, further training needed  HOME EXERCISE PROGRAM: IR A/AAROM to promote shoulder flexibility for peri care/bathing  GOALS: Goals reviewed with patient? Yes  SHORT TERM GOALS: Target date: 12/25/23  Pt will perform bed bath with set up only. Baseline: Eval: Min-mod A for posterior washing; 09/28/23: Min A for posterior washing; 11/13/23: Able to manage bed bath but sister helps with thoroughness, per pt's preference; pt in agreement to trial bath sitting EOB or in wc before next OT session. Goal status: achieved/d/c  2.  Pt will utilize sliding board for wc<>drop arm commode transfer with SBA. Baseline: Eval: Currently using electric lift for transfer to [facility]; 09/28/23: Not yet completed at home but transfers to commode have been easier without sliding board d/t pt implementing a scoot pivot. Goal status: d/c (pt does not prefer sliding board for commode transfer)  3.  Pt will be indep to perform HEP for maintaining BUE strength for ADLs and functional transfers. Baseline: Eval: HEP not yet initiated; 09/28/23: Pt is working on core stability exercises from wc, cane stretches for bilat shoulder IR, and tricep extension via wc pushups; 11/13/23: indep Goal status: achieved  LONG TERM GOALS: Target date: 02/05/24  Pt will perform sink bath with set up A. (Revised on 11/13/23 from min A to set up) Baseline: Eval: Currently bed bathing d/t inability to stand at sink without  electric lift (lift does not fit into pt's bathroom); 09/28/23: still completing bed bath as pt is not yet able to stand; 11/13/23: Not yet attempted; pt encouraged to attempt before next OT session now that bed bath goal has been met; 12/20/23: Pt is now completing sponge bath with min A while seated EOB (caregiver assists with washing peri area d/t no bed rail to aid in R lateral lean) Goal status: ongoing   2.  Pt will perform wc<>tub bench transfer with min A. Baseline: Eval: Unable; 09/28/23: Performed in OT clinic with min A, but not yet in the home Goal status: d/c (pt prefers sink bath)  3.  Pt will perform squat pivot transfer wc<>BSC with SBA.  Baseline: Eval: Eval: Currently using electric lift for transfer to [facility]; 09/28/23: Not yet completed in clinic with [facility], and using lift for [facility] in the home still; 11/13/23: Still using Camie lift at home and in clinic transfers are all scoot rather than squat pivots d/t LE weakness; 12/20/23: Pt reports that she plans to use her Camie lift until she can ambulate to her [facility] d/t limited space for wc in her desired location for [facility] at home. Goal status: d/c  4. Pt will perform scoot transfer wc<>toilet using grab bars with supv to allow toileting in community setting.  Baseline: Eval: Pt limits community outings d/t requiring use of lift for [facility] transfers; 09/28/23: Pt has performed 2 successful scoot pivot  Transfers to toilet in OT clinic with min A, extra time.  Further trials with clothing management on toilet needed, but pt can lower and hike  pants while sititng edge of mat via lateral leaning and min A to maintain core stability; 11/13/23: Pt performs with CGA and extra time; 12/19/23: Pt   performs with close supv on most attempts, occasional CGA; efficiency is improving  Goal status: ongoing  5.  Pt will complete seated clothing management with min A to enable voiding in handicapped stall within community. Baseline: Recert 11/13/23: Pt can transfer to  toilet with increased time and effort, but has not yet attempted clothing management or voiding while seated on commode in community setting.  Pt can lower pants to knees while seated in wc, but requires mod A to hike over hips from seated position; 12/20/23: Pt fully lowers panties and shorts while seated on commode with supv, can hike clothing to her her upper thighs, transfers back to wc, and then performs a tricep extension from wc level to allow caregiver to hike clothing remainder of the way for purposes of EC (min A for clothing management component) Goal status: achieved  6.  Pt will tolerate standing x1 min with close supv and BUE support to manage clothing in prep for toileting. Baseline: Eval: Currently standing in electric lift only, or within parallel bars without lift; 09/28/23: Not yet attempted; 11/13/23: Pt is able to perform static standing with heavy BUE support on parallel bars with PT; 12/19/22: Not attempted recently d/t new intermittent clonus in BLEs  Goal status: in progress  7.  Pt will increase bilat grip strength by (TBD) in order to securely grasp walker sufficiently for functional transfers and ambulation.  Baseline: Recert 11/13/23: TBD (PT note read following OT session, indicating pt with difficulty securing items in hand);  11/15/23: Grip strength: R: 70 lbs, L: 63 lbs; 12/25/23: R 70 lbs, L 65 lbs  Goal status: in progress  8.  Pt will increase bilat [facility] skills in order to manipulate small ADL supplies with reduced dropping as indicated by (TBD) improvement in 9 hole peg test. Baseline: Recert 11/13/23: 9 hole peg test TBD (PT note read following OT session, indicating pt with difficulty manipulating and securing items in hands); 11/15/23: Right: 25 sec, L 49 sec; 12/25/23: Right: 24 sec; Left: 3 trials: 54 sec, 1 min, 56 sec  Goal status: in progress  ASSESSMENT: CLINICAL IMPRESSION: Pt steadily improving forward weight shifting in prep for sit to stand.  Pt was able to  briefly reach for chair arm rest in front of her with 1 hand, but with quick return to wc arm rests to control descent back to wc.  Pt is steadily improving core stability, noting ability to return to midline after reaching slightly outside BOS forward or laterally in either direction without support of wc arm rests and without falling in to back rest of chair.  Pt will continue to benefit from skilled OT to work towards above noted goals in OT poc, working to maximize indep with daily tasks while reducing burden of care on caregivers.   PERFORMANCE DEFICITS: in functional skills including ADLs, IADLs, strength, pain, flexibility, Gross motor control, mobility, balance, body mechanics, endurance, and decreased knowledge of use of DME, and psychosocial skills including coping strategies, environmental adaptation, habits, and routines and behaviors.   IMPAIRMENTS: are limiting patient from ADLs, IADLs, and social participation.   CO-MORBIDITIES: has co-morbidities such as neuropathy, back pain, obesity, MS, HTN that affects occupational performance. Patient will benefit from skilled OT to address above impairments and improve overall function.  MODIFICATION OR ASSISTANCE TO COMPLETE EVALUATION: No modification of tasks or assist necessary to complete an evaluation.  OT OCCUPATIONAL PROFILE AND HISTORY: Detailed assessment: Review of records and additional review of physical, cognitive, psychosocial history related to current functional performance.  CLINICAL DECISION MAKING: Moderate - several treatment options, min-mod task modification necessary  REHAB POTENTIAL: Good  EVALUATION COMPLEXITY: Moderate  PLAN:  OT FREQUENCY: 2x/week  OT DURATION: 12 weeks  PLANNED INTERVENTIONS: 97168 OT Re-evaluation, 97535 self care/ADL training, 02889 therapeutic exercise, 97530 therapeutic activity, 97112 neuromuscular re-education, 97140 manual therapy, 97116 gait training, 02989 moist heat, 97010  cryotherapy, balance training, functional mobility training, psychosocial skills training, energy conservation, coping strategies training, patient/family education, and DME and/or AE instructions  RECOMMENDED OTHER SERVICES: None at this time (Pt currently receiving PT services in this clinic)  CONSULTED AND AGREED WITH PLAN OF CARE: Patient  PLAN FOR NEXT SESSION: see above  Inocente Blazing, MS, OTR/L  Inocente MARLA Blazing, OT 01/05/2024, 11:05 AM

## 2024-01-07 DIAGNOSIS — G35 Multiple sclerosis: Secondary | ICD-10-CM | POA: Diagnosis not present

## 2024-01-08 ENCOUNTER — Ambulatory Visit: Payer: PPO | Attending: Neurology

## 2024-01-08 ENCOUNTER — Ambulatory Visit

## 2024-01-08 DIAGNOSIS — M6281 Muscle weakness (generalized): Secondary | ICD-10-CM | POA: Diagnosis not present

## 2024-01-08 DIAGNOSIS — R262 Difficulty in walking, not elsewhere classified: Secondary | ICD-10-CM | POA: Diagnosis not present

## 2024-01-08 DIAGNOSIS — R278 Other lack of coordination: Secondary | ICD-10-CM | POA: Insufficient documentation

## 2024-01-08 DIAGNOSIS — R2681 Unsteadiness on feet: Secondary | ICD-10-CM | POA: Diagnosis not present

## 2024-01-08 DIAGNOSIS — R2689 Other abnormalities of gait and mobility: Secondary | ICD-10-CM | POA: Diagnosis not present

## 2024-01-08 DIAGNOSIS — G35 Multiple sclerosis: Secondary | ICD-10-CM | POA: Diagnosis not present

## 2024-01-08 DIAGNOSIS — R269 Unspecified abnormalities of gait and mobility: Secondary | ICD-10-CM | POA: Insufficient documentation

## 2024-01-08 NOTE — Therapy (Signed)
 OUTPATIENT PHYSICAL THERAPY NEURO TREATMENT   Patient Name: Gracynn Rajewski Huizinga MRN: [id number] DOB:[birth year], 63 y.o., female Today's Date: 01/08/2024   PCP: Lenon Na  REFERRING PROVIDER: Lenon Na  END OF SESSION:  PT End of Session - 01/08/24 1323     Visit Number 49    Number of Visits 70    Date for PT Re-Evaluation 03/20/24   corrected   Progress Note Due on Visit 50    PT Start Time 1016    PT Stop Time 1059    PT Time Calculation (min) 43 min    Equipment Utilized During Treatment Gait belt    Activity Tolerance Patient tolerated treatment well;No increased pain    Behavior During Therapy [facility] for tasks assessed/performed                Past Medical History:  Diagnosis Date   Abdominal pain, right upper quadrant    Back pain    Calculus of kidney 12/09/2013   Chronic back pain    unspecified   Chronic left shoulder pain 07/19/2015   Complication of anesthesia    Functional disorder of bladder    other   Galactorrhea 11/26/2014   Chronic    Hereditary and idiopathic neuropathy 08/19/2013   History of kidney stones    HPV test positive    Hypercholesteremia 08/19/2013   Hypertension    Incomplete bladder emptying    Microscopic hematuria    MS (multiple sclerosis) (HCC)    Muscle spasticity 05/21/2014   Nonspecific findings on examination of urine    other   Osteopenia    PONV (postoperative nausea and vomiting)    Status post laparoscopic supracervical hysterectomy 11/26/2014   Tobacco user 11/26/2014   Wrist fracture    Past Surgical History:  Procedure Laterality Date   bilateral tubal ligation  1996   BREAST CYST EXCISION Left 2002   CYST EXCISION Left 05/10/2022   Procedure: CYST REMOVAL;  Surgeon: Rodolph Romano, MD;  Location: [facility] ORS;  Service: General;  Laterality: Left;   FRACTURE SURGERY     KNEE SURGERY     right   LAPAROSCOPIC SUPRACERVICAL HYSTERECTOMY  08/05/2013   ORIF WRIST FRACTURE  Left 01/17/2017   Procedure: OPEN REDUCTION INTERNAL FIXATION (ORIF) WRIST FRACTURE;  Surgeon: Leora Lynwood SAUNDERS, MD;  Location: [facility] ORS;  Service: Orthopedics;  Laterality: Left;   RADIOLOGY WITH ANESTHESIA N/A 03/18/2020   Procedure: MRI WITH ANESTHESIA CERVICAL SPINE AND BRAIN  WITH AND WITHOUT CONTRAST;  Surgeon: Radiologist, Medication, MD;  Location: [facility] OR;  Service: Radiology;  Laterality: N/A;   TUBAL LIGATION Bilateral    VAGINAL HYSTERECTOMY  03/2006   Patient Active Problem List   Diagnosis Date Noted   Abnormal LFTs (liver function tests) 09/17/2023   Sinus tachycardia 07/07/2023   Aortic atherosclerosis (HCC) 01/18/2023   Prediabetes 01/18/2023   Hypokalemia 07/29/2022   Weakness of both lower extremities 07/29/2022   Abscess of left groin 11/15/2021   Abnormal LFTs 11/15/2021   Acute respiratory disease due to COVID-19 virus 11/21/2020   Weakness    Hypoalbuminemia due to protein-calorie malnutrition [facility])    Neurogenic bowel    Neurogenic bladder    Labile blood pressure    Neuropathic pain    Multiple sclerosis, relapsing-remitting (HCC) 07/09/2019   Abscess of female pelvis    SVT (supraventricular tachycardia) (HCC)    Radial styloid tenosynovitis 03/12/2018   Wheelchair dependence 02/27/2018   Localized osteoporosis with current pathological fracture with routine  healing 01/19/2017   Wrist fracture 01/16/2017   Sprain of ankle 03/23/2016   Closed fracture of lateral malleolus 03/16/2016   Health care maintenance 01/24/2016   Blood pressure elevated without history of HTN 10/25/2015   Essential hypertension 10/25/2015   Multiple sclerosis (HCC) 10/02/2015   Chronic left shoulder pain 07/19/2015   Multiple sclerosis exacerbation (HCC) 07/14/2015   MS (multiple sclerosis) (HCC) 11/26/2014   Increased body mass index 11/26/2014   HPV test positive 11/26/2014   Status post laparoscopic supracervical hysterectomy 11/26/2014   Galactorrhea 11/26/2014   Back ache  05/21/2014   Adiposity 05/21/2014   Disordered sleep 05/21/2014   Muscle spasticity 05/21/2014   Spasticity 05/21/2014   Calculus of kidney 12/09/2013   Renal colic 12/09/2013   Hypercholesteremia 08/19/2013   Hereditary and idiopathic neuropathy 08/19/2013   Hypercholesterolemia without hypertriglyceridemia 08/19/2013   Bladder infection, chronic 07/25/2012   Disorder of bladder function 07/25/2012   Incomplete bladder emptying 07/25/2012   Microscopic hematuria 07/25/2012   Right upper quadrant pain 07/25/2012    ONSET DATE: 1995  REFERRING DIAG: MS  THERAPY DIAG:  Muscle weakness (generalized)  Other lack of coordination  Multiple sclerosis exacerbation (HCC)  Difficulty in walking, not elsewhere classified  Unsteadiness on feet  Abnormality of gait and mobility  Other abnormalities of gait and mobility  Rationale for Evaluation and Treatment: Rehabilitation  SUBJECTIVE:                                                                                                                                                                                             SUBJECTIVE STATEMENT:  Pt reports doing well with no new issues since last visit.    Pt accompanied by: self  PERTINENT HISTORY:  Patient is returning to PT s/p hospitalization.  s/p ORIF of R tibia shaft fracture 09/23/2022. Patient has weakness in BLE with RLE>LLE. She drives with hand controls. Patient has been diagnosed with MS in 1995. PMH includes: back pain, CBP, chronic L shoulder pain, galactorrhea, neuropathy, HPV, hypercholestermeia, HTN, MS, osteopenia, PONV, wrist fracture. Additional order for other closed fracture of proximal end of R tibia with routine healing. Still has to use [employer].   PAIN:  Are you having pain? Occasional pain in RLE; primarily in knee  PRECAUTIONS: Fall  RED FLAGS: None   WEIGHT BEARING RESTRICTIONS: No  FALLS: Has patient fallen in last 6 months? No  LIVING  ENVIRONMENT: Lives with: lives with their family Lives in: House/apartment Stairs: ramp Has following equipment at home: Vannie - 2 wheeled, Wheelchair (manual), shower chair, and Grab bars  PLOF: Independent with household  mobility with device  PATIENT GOALS: to get her independence back. To be able to get into car, toilet, and get dressed independently  OBJECTIVE:  Note: Objective measures were completed at Evaluation unless otherwise noted.  DIAGNOSTIC FINDINGS: MRI of the brain 03/18/2020 showed multiple T2/FLAIR hyperintense foci in the periventricular, juxtacortical and deep white matter.  There were no infratentorial lesions noted.  None of the foci enhanced.   Due to severe claustrophobia, the study was done with conscious sedation in the hospital   COGNITION: Overall cognitive status: Within functional limits for tasks assessed   SENSATION: Lack of sensation in feet, loss in bilateral lateral aspect of knee  COORDINATION: Does not have the strength for functional LE heel slide test   MUSCLE TONE: BLE mild tone    POSTURE: rounded shoulders, forward head, anterior pelvic tilt, and weight shift left   LOWER EXTREMITY MMT:    MMT Right Eval Left Eval  Hip flexion 0.9 1.5  Hip extension    Hip abduction 1.8 2.6  Hip adduction 3.1 1.9  Hip internal rotation    Hip external rotation    Knee flexion 0.9 1.5  Knee extension 0.8 1.2  Ankle dorsiflexion    Ankle plantarflexion    Ankle inversion    Ankle eversion    (Blank rows = not tested)  BED MOBILITY:  Assess in future session due to limited time  TRANSFERS: Assistive device utilized: Bariatric RW with sit to stands, slide board to table  Sit to stand: unable to reach full stand Stand to sit: unable to reach full stand  Chair to chair: slide board with CGA   GAIT: Unable to ambulate at this time.   FUNCTIONAL TESTS:  Sit to stand: tricep press with BUE; unable to bring arm to walker.    Function In  Sitting Test (FIST)  (1/2 femur on surface; hips/knees flexed to 90deg)   - indicate bed or mat table / step stool if used  SCORING KEY: 4 = Independent (completes task independently & successfully) 3 = Verbal Cues/Increased Time (completes task independently & successfully and only needs more time/cues) 2 = Upper Extremity Support (must use UE for support or assistance to complete successfully) 1 = Needs Assistance (unable to complete w/o physical assist; DOCUMENT LEVEL: min, mod, max) 0 = Dependent (requires complete physical assist; unable to complete successfully even w/ physical assist)  Randomly Administer Once Throughout Exam  4 - Anterior Nudge (superior sternum)  4 - Posterior Nudge (between scapular spines)  4 - Lateral Nudge (to dominant side at acromion)     4 - Static sitting (30 seconds)  4 - Sitting, shake 'no' (left and right)  4 - Sitting, eyes closed (30 seconds)   0 - Sitting, lift foot (dominant side, lift foot 1 inch twice)    2 - Pick up object from behind (object at midline, hands breadth posterior)  3 - Forward reach (use dominant arm, must complete full motion) 2 - Lateral reach (use dominant arm, clear opposite ischial tuberosity) 2 - Pick up object from floor (from between feet)   2 - Posterior scooting (move backwards 2 inches)  2 - Anterior scooting (move forward 2 inches)  2 - Lateral scooting (move to dominant side 2 inches)    TOTAL = 39/56  Notes/comments: slide board tranfer to/from table   MCD > 5 points MCID for IP REHAB > 6 points  Function In Sitting Test (FIST) 08/14/23 (1/2 femur on surface; hips/knees flexed  to 90deg)   - indicate bed or mat table / step stool if used  SCORING KEY: 4 = Independent (completes task independently & successfully) 3 = Verbal Cues/Increased Time (completes task independently & successfully and only needs more time/cues) 2 = Upper Extremity Support (must use UE for support or assistance to complete  successfully) 1 = Needs Assistance (unable to complete w/o physical assist; DOCUMENT LEVEL: min, mod, max) 0 = Dependent (requires complete physical assist; unable to complete successfully even w/ physical assist)  Randomly Administer Once Throughout Exam  4 - Anterior Nudge (superior sternum)  4 - Posterior Nudge (between scapular spines)  4 - Lateral Nudge (to dominant side at acromion)     4 - Static sitting (30 seconds)  4 - Sitting, shake 'no' (left and right)  4 - Sitting, eyes closed (30 seconds)   0 - Sitting, lift foot (dominant side, lift foot 1 inch twice)    4 - Pick up object from behind (object at midline, hands breadth posterior)  4 - Forward reach (use dominant arm, must complete full motion) 4 - Lateral reach (use dominant arm, clear opposite ischial tuberosity)  - Pick up object from floor (from between feet)   3 - Posterior scooting (move backwards 2 inches)  3 - Anterior scooting (move forward 2 inches)  3 - Lateral scooting (move to dominant side 2 inches)    TOTAL = 47/56  MCD > 5 points MCID for IP REHAB > 6 points                                                                                                                              TREATMENT DATE: 01/01/24      TA: Standing frame (using the Strap stand): Patient able to push up from chair to minimally raise her bottom to allow author to position gluteal padding underneath her. Dependent with foot straps and locking of frame. Patient initially performed standing pumping up using her RUE but then lowered back down due to strap was positioned too far underneath her legs. Repositioned strap under to backside/gluteal region and assisted her to standing position. Patient was able to stand but exhibited trunk deviation and lateral weight shifting. She was lowered back down repositioned strap and knee pads. She was then able to stand in near complete erect standing and performed the following:   30+ min  total- focusing on some dynamic UE activities- playing card games and later connect 4. Patient was able to maintain standing without any report of pain or need to sit  - She was able to engage the gluteals and quads some  during activities and able to stand without leaning to left.      PATIENT EDUCATION: Education details: Pt educated throughout session about proper posture and technique with exercises. Improved exercise technique, movement at target joints, use of target muscles after min to mod verbal, visual, tactile cues.  Person educated: Patient Education  method: Explanation, Demonstration, Tactile cues, and Verbal cues Education comprehension: verbalized understanding, returned demonstration, verbal cues required, tactile cues required, and needs further education  HOME EXERCISE PROGRAM: Stand 3x/day in stander   GOALS: Goals reviewed with patient? Yes  SHORT TERM GOALS: Target date: 08/08/2023  Patient will be independent in home exercise program to improve strength/mobility for better functional independence with ADLs.  Baseline: 4/8: compliance Goal status: MET    LONG TERM GOALS: Target date: 03/20/2024  Patient will tolerate five consecutive stands with UE support from wheelchair to standing to improve functional mobility with additional cushion and RW.  Baseline: unable to perform 4/8:unable to perform full stand 5/20: perform next session 5/29: two full stands with CGA and cushion on her seat 7/31: able to stand 3 times from [facility]. With no addition cushion  in parallel bars. 12/27/2023= Will retest next session- concentrated on just standing on past 2 visits Goal status: Ongoing   2.  Patient will ambulate 10 ft with bRW with wheelchair follow.  Baseline:  unable to ambulate 4/8: unable to ambulate 5/20: able to ambulate length of // bars in previous session 5/27: ambulate 2.5 lengths of // bars with two seated rest breaks with min A for weight shift and wheelchair follow   7/31: able to ambulate in parallel bars 3 ft with min assist and +2 follow in WC> difficulty advancing the LLE on this day. 12/27/2023- Patient unable to take a step forward today but was abel to take a step on previous visit- limited by clonus like trembling that has progressed over past month. Goal status: Ongoing   3.  Patient will improve FIST score >6 points to demonstrate improved stability and ability to perform ADLs.  Baseline:  3/6: 39/56 4/8: 47/ 56 5/20: 48/56 7/31:46/56 Goal status: MET  4.  Patient will perform toileting with mod I at home for improved independence.  Baseline: 3/6: requires assistance 4/8: requires dependence on machine 5/20: requires assistance with use of wipe buddy.  7/31: continues to require use of sara steady and wipe buddy for safety and hygiene. 12/27/2023- Patient reports that since she is unable to take steps that she continues to require use of sara steady and wipe buddy for safe personal hygiene. Goal status: ongoing   5. Patient will perform > 5 min of static or dynamic standing with BUE and Min/mod assist  for improved LE strength and pre-gait function to assist with toileting and overall standing ability for improve functional independence.  Baseline: 12/27/2023-Patient was able to stand for just over 3 min today. Goal status: New    ASSESSMENT:  CLINICAL IMPRESSION: Treatment was successful in transitioning to more dynamic UE activities with use of standing frame today. She was able to perform 30+ min of activities today without a rest break plus some time spent with transfer using frame to work on proper placement of padding to ensure safety with standing. Overall patient able to engage her gluteals and quads some and spent a great amount of time on her feet today without significant difficulty. Pt will continue to benefit from skilled therapy to address remaining deficits in order to improve overall QoL and return to PLOF.       OBJECTIVE  IMPAIRMENTS: Abnormal gait, cardiopulmonary status limiting activity, decreased activity tolerance, decreased balance, decreased coordination, decreased endurance, decreased mobility, difficulty walking, decreased ROM, decreased strength, hypomobility, increased fascial restrictions, impaired perceived functional ability, impaired flexibility, impaired sensation, improper body mechanics, and postural dysfunction.   ACTIVITY  LIMITATIONS: carrying, lifting, bending, sitting, standing, squatting, sleeping, stairs, transfers, bed mobility, continence, bathing, toileting, dressing, self feeding, reach over head, hygiene/grooming, locomotion level, and caring for others  PARTICIPATION LIMITATIONS: meal prep, cleaning, laundry, interpersonal relationship, driving, shopping, community activity, and church  PERSONAL FACTORS: Age, Past/current experiences, Time since onset of injury/illness/exacerbation, Transportation, and 3+ comorbidities: ack pain, CBP, chronic L shoulder pain, galactorrhea, neuropathy, HPV, hypercholestermeia, HTN, MS, osteopenia, PONV, wrist fracture are also affecting patient's functional outcome.   REHAB POTENTIAL: Good  CLINICAL DECISION MAKING: Evolving/moderate complexity  EVALUATION COMPLEXITY: Moderate  PLAN:  PT FREQUENCY: 2x/week  PT DURATION: 12 weeks  PLANNED INTERVENTIONS: 97164- PT Re-evaluation, 97110-Therapeutic exercises, 97530- Therapeutic activity, 97112- Neuromuscular re-education, 97535- Self Care, [id number]- Manual therapy, [phone]- Gait training, [phone]- Orthotic Fit/training, [phone]- Canalith repositioning, [id number]- Aquatic Therapy, [phone]- Electrical stimulation (unattended), [phone]- Electrical stimulation (manual), [id number]- Ultrasound, [id number]- Traction (mechanical), Patient/Family education, Balance training, Stair training, Taping, Dry Needling, Joint mobilization, Joint manipulation, Spinal manipulation, Spinal mobilization, Scar mobilization, Compression bandaging,  Vestibular training, Visual/preceptual remediation/compensation, Cognitive remediation, DME instructions, Cryotherapy, Moist heat, and Biofeedback  PLAN FOR NEXT SESSION: Progress report due next visit- Reassess goals Strap stand frame with more dynamic activities Sit to stands; try supported walking, continue dynamic core stabilization training and seated reaching tasks to improve weight shifting.    Chyrl London, PT Physical Therapist - [place]  [facility]  1:25 PM 01/08/24

## 2024-01-08 NOTE — Therapy (Signed)
 OUTPATIENT OCCUPATIONAL THERAPY NEURO TREATMENT NOTE  Patient Name: Lisa Williams MRN: [id number] DOB:March 16, 1961, 63 y.o., female Today's Date: 01/08/2024  PCP: Dr. Layman Piety   Neurologist Dr. Suanne at [facility] REFERRING PROVIDER: Dr. Layman Piety    END OF SESSION:  OT End of Session - 01/08/24 1134     Visit Number 35    Number of Visits 44    Date for OT Re-Evaluation 02/05/24    Authorization Time Period Reporting period beginning 12/20/23    Progress Note Due on Visit 40    OT Start Time 1100    OT Stop Time 1140    OT Time Calculation (min) 40 min    Equipment Utilized During Treatment manual wc    Activity Tolerance Patient tolerated treatment well    Behavior During Therapy [facility] for tasks assessed/performed         Past Medical History:  Diagnosis Date   Abdominal pain, right upper quadrant    Back pain    Calculus of kidney 12/09/2013   Chronic back pain    unspecified   Chronic left shoulder pain 07/19/2015   Complication of anesthesia    Functional disorder of bladder    other   Galactorrhea 11/26/2014   Chronic    Hereditary and idiopathic neuropathy 08/19/2013   History of kidney stones    HPV test positive    Hypercholesteremia 08/19/2013   Hypertension    Incomplete bladder emptying    Microscopic hematuria    MS (multiple sclerosis) (HCC)    Muscle spasticity 05/21/2014   Nonspecific findings on examination of urine    other   Osteopenia    PONV (postoperative nausea and vomiting)    Status post laparoscopic supracervical hysterectomy 11/26/2014   Tobacco user 11/26/2014   Wrist fracture    Past Surgical History:  Procedure Laterality Date   bilateral tubal ligation  1996   BREAST CYST EXCISION Left 2002   CYST EXCISION Left 05/10/2022   Procedure: CYST REMOVAL;  Surgeon: Rodolph Romano, MD;  Location: [facility] ORS;  Service: General;  Laterality: Left;   FRACTURE SURGERY     KNEE SURGERY     right   LAPAROSCOPIC  SUPRACERVICAL HYSTERECTOMY  08/05/2013   ORIF WRIST FRACTURE Left 01/17/2017   Procedure: OPEN REDUCTION INTERNAL FIXATION (ORIF) WRIST FRACTURE;  Surgeon: Leora Lynwood SAUNDERS, MD;  Location: [facility] ORS;  Service: Orthopedics;  Laterality: Left;   RADIOLOGY WITH ANESTHESIA N/A 03/18/2020   Procedure: MRI WITH ANESTHESIA CERVICAL SPINE AND BRAIN  WITH AND WITHOUT CONTRAST;  Surgeon: Radiologist, Medication, MD;  Location: [facility] OR;  Service: Radiology;  Laterality: N/A;   TUBAL LIGATION Bilateral    VAGINAL HYSTERECTOMY  03/2006   Patient Active Problem List   Diagnosis Date Noted   Abnormal LFTs (liver function tests) 09/17/2023   Sinus tachycardia 07/07/2023   Aortic atherosclerosis (HCC) 01/18/2023   Prediabetes 01/18/2023   Hypokalemia 07/29/2022   Weakness of both lower extremities 07/29/2022   Abscess of left groin 11/15/2021   Abnormal LFTs 11/15/2021   Acute respiratory disease due to COVID-19 virus 11/21/2020   Weakness    Hypoalbuminemia due to protein-calorie malnutrition [facility])    Neurogenic bowel    Neurogenic bladder    Labile blood pressure    Neuropathic pain    Multiple sclerosis, relapsing-remitting (HCC) 07/09/2019   Abscess of female pelvis    SVT (supraventricular tachycardia) (HCC)    Radial styloid tenosynovitis 03/12/2018   Wheelchair dependence 02/27/2018  Localized osteoporosis with current pathological fracture with routine healing 01/19/2017   Wrist fracture 01/16/2017   Sprain of ankle 03/23/2016   Closed fracture of lateral malleolus 03/16/2016   Health care maintenance 01/24/2016   Blood pressure elevated without history of HTN 10/25/2015   Essential hypertension 10/25/2015   Multiple sclerosis (HCC) 10/02/2015   Chronic left shoulder pain 07/19/2015   Multiple sclerosis exacerbation (HCC) 07/14/2015   MS (multiple sclerosis) (HCC) 11/26/2014   Increased body mass index 11/26/2014   HPV test positive 11/26/2014   Status post laparoscopic supracervical  hysterectomy 11/26/2014   Galactorrhea 11/26/2014   Back ache 05/21/2014   Adiposity 05/21/2014   Disordered sleep 05/21/2014   Muscle spasticity 05/21/2014   Spasticity 05/21/2014   Calculus of kidney 12/09/2013   Renal colic 12/09/2013   Hypercholesteremia 08/19/2013   Hereditary and idiopathic neuropathy 08/19/2013   Hypercholesterolemia without hypertriglyceridemia 08/19/2013   Bladder infection, chronic 07/25/2012   Disorder of bladder function 07/25/2012   Incomplete bladder emptying 07/25/2012   Microscopic hematuria 07/25/2012   Right upper quadrant pain 07/25/2012   ONSET DATE: 5/24 Amon and broke R leg which caused beginning of ADL decline; diagnosed with MS in 1995)  REFERRING DIAG: MS  THERAPY DIAG:  Muscle weakness (generalized)  Other lack of coordination  Multiple sclerosis exacerbation (HCC)  Rationale for Evaluation and Treatment: Rehabilitation  SUBJECTIVE:  SUBJECTIVE STATEMENT: I can tell I'm getting stronger.  Pt accompanied by: self  PERTINENT HISTORY: Pt reports increased difficulty with basic self care tasks since breaking her R leg last May.  Since that time, mobility and ADLs have declined, with pt requiring assist from private caregivers and family to manage bathing, toileting, dressing, and functional transfers.  PRECAUTIONS: Fall  WEIGHT BEARING RESTRICTIONS: No  PAIN: 01/08/24: No pain  Are you having pain? No; occasional pain in R lower back, but not today  FALLS: Has patient fallen in last 6 months? No Last fall was May 17 of 2024  LIVING ENVIRONMENT: Lives with: lives with their family (including mother who has dementia and sister [name])  Lives in: 2 level home but pt resides on main level Stairs: ramp  Has following equipment at home: Wheelchair (manual), Shower bench, and hand held shower shower, 1 grab bar in the shower, 3in1 commode, sit to stand lift (electric), FWW, hospital bed  PLOF: modified indep with ADL/IADLs prior to  May of 2024   PATIENT GOALS: Increase independence with basic self care tasks  OBJECTIVE:  Note: Objective measures were completed at Evaluation unless otherwise noted.  HAND DOMINANCE: Right  ADLs:  Overall ADLs: assist provided from paid caregiver and sister Transfers/ambulation related to ADLs: sit to stand lift for all transfers, set up for sliding board in/out of bed  Eating: indep  Grooming: modified indep (wc level in mom's bathroom)  UB Dressing: set up (can't access her bedroom closet)  LB Dressing: able to sit on side of bed to don all LB clothing; assist to hike pants/underwear Toileting: assist with clothing management d/t standing within electric lift Bathing: bed bath with sister helping to wash backside Tub Shower transfers: N/A; pt has walk in shower in mom's bathroom (wc does fit in this bathroom but pt reports inability to transfer from wc and requires arm rests on a bench for a successful transfer attempt from any DME).  Tub bench is in pt's bathroom, but lift does not fit through bathroom doorway and pt reports inability to transfer up from tub bench (currently  unable to manage either transfer)   Equipment: see above  IADLs: Shopping: [employer] for shopping Light housekeeping: modified indep from wc level Meal Prep: modified indep from wc level Community mobility: relies on community transportation or family members Medication management: indep Landscape architect: indep Handwriting: NT; pt denies any FMC challenges  MOBILITY STATUS: Hx of falls  POSTURE COMMENTS:  Rounded shoulders, forward head, anterior pelvic tilt, and weight shift left   ACTIVITY TOLERANCE: Activity tolerance: Per PT; currently standing 30-60 sec within Light Gait harness/lift; currently non-ambulatory  UPPER EXTREMITY ROM:  BUEs WFL  UPPER EXTREMITY MMT:     MMT Right eval Left eval  Shoulder flexion 4+ 4  Shoulder abduction 4+ 4  Shoulder adduction    Shoulder extension     Shoulder internal rotation 4+ 4+  Shoulder external rotation 4 4  Middle trapezius    Lower trapezius    Elbow flexion 4+ 4+  Elbow extension 4+ 4+  Wrist flexion 4+ 4+  Wrist extension 4+ 4+  Wrist ulnar deviation    Wrist radial deviation    Wrist pronation    Wrist supination    (Blank rows = not tested)  HAND FUNCTION/COORDINATION:  R/L WNL; pt denies any coordination deficits/ able to manipulate pills/clothing fasteners/etc without difficulty  11/15/23: *Measures taken d/t PT note revealing pt with increased difficulty manipulating smaller ADL supplies during reaching activities in PT session   11/15/23: Grip strength: R: 70 lbs, L: 63 lbs Pinch strength: Lateral: R: 16 lbs, L: 15 lbs; 3 point pinch: R: 12 lbs, L: 13 lbs 9 hole peg test: Right: 25 sec, L 49 sec   12/25/23: Grip strength: Right: 70 lbs; Left: 65 lbs  9 hole peg test: Right: 24 sec; Left: 3 trials: 54 sec, 1 min, 56 sec  SENSATION: WFL  EDEMA: No visible edema in BUEs  MUSCLE TONE: BUEs WNL  COGNITION: Overall cognitive status: Within functional limits for tasks assessed  VISION: wears glasses all the time, no reports of diplopia  PERCEPTION: Not tested  PRAXIS: WFL  OBSERVATIONS:  Pt pleasant, cooperative, and motivated to work towards improving indep with ADLs.                                                                                                     TREATMENT DATE: 01/08/24 Self Care: -Sit to stand prep for ADL transfers: Pt practiced forward weight shift to achieve partial sit to stand from wc level, knees blocked with pillow cushioning edge of chair in front of her (chair stabilized by wall) -OT placed 2.5 foam cushion on top of wc cushion to increase seat height to improve sit to stand attempts, and provided mod vc for increasing forward weight shift and attempts at reaching for chair arm rest in front of her x10 reps.  Therapeutic Activity: -Facilitated FMC, core stability, and  forward/lateral weight shifting with pt reaching R/L laterally outside BOS in sitting; pt practiced moving washers from a dish and threading them over a diagonal dowel which was positioned to promote extended reaching positions noted above; 1  trial of 24 washers moved to each side.  PATIENT EDUCATION: Education details: STS transfer technique  Person educated: Patient Education method: Explanation, vc, demo Education comprehension: verbalized understanding, demonstrated understanding, further training needed  HOME EXERCISE PROGRAM: IR A/AAROM to promote shoulder flexibility for peri care/bathing  GOALS: Goals reviewed with patient? Yes  SHORT TERM GOALS: Target date: 12/25/23  Pt will perform bed bath with set up only. Baseline: Eval: Min-mod A for posterior washing; 09/28/23: Min A for posterior washing; 11/13/23: Able to manage bed bath but sister helps with thoroughness, per pt's preference; pt in agreement to trial bath sitting EOB or in wc before next OT session. Goal status: achieved/d/c  2.  Pt will utilize sliding board for wc<>drop arm commode transfer with SBA. Baseline: Eval: Currently using electric lift for transfer to [facility]; 09/28/23: Not yet completed at home but transfers to commode have been easier without sliding board d/t pt implementing a scoot pivot. Goal status: d/c (pt does not prefer sliding board for commode transfer)  3.  Pt will be indep to perform HEP for maintaining BUE strength for ADLs and functional transfers. Baseline: Eval: HEP not yet initiated; 09/28/23: Pt is working on core stability exercises from wc, cane stretches for bilat shoulder IR, and tricep extension via wc pushups; 11/13/23: indep Goal status: achieved  LONG TERM GOALS: Target date: 02/05/24  Pt will perform sink bath with set up A. (Revised on 11/13/23 from min A to set up) Baseline: Eval: Currently bed bathing d/t inability to stand at sink without electric lift (lift does not fit into pt's  bathroom); 09/28/23: still completing bed bath as pt is not yet able to stand; 11/13/23: Not yet attempted; pt encouraged to attempt before next OT session now that bed bath goal has been met; 12/20/23: Pt is now completing sponge bath with min A while seated EOB (caregiver assists with washing peri area d/t no bed rail to aid in R lateral lean) Goal status: ongoing   2.  Pt will perform wc<>tub bench transfer with min A. Baseline: Eval: Unable; 09/28/23: Performed in OT clinic with min A, but not yet in the home Goal status: d/c (pt prefers sink bath)  3.  Pt will perform squat pivot transfer wc<>BSC with SBA.  Baseline: Eval: Eval: Currently using electric lift for transfer to [facility]; 09/28/23: Not yet completed in clinic with [facility], and using lift for [facility] in the home still; 11/13/23: Still using Camie lift at home and in clinic transfers are all scoot rather than squat pivots d/t LE weakness; 12/20/23: Pt reports that she plans to use her Camie lift until she can ambulate to her [facility] d/t limited space for wc in her desired location for [facility] at home. Goal status: d/c  4. Pt will perform scoot transfer wc<>toilet using grab bars with supv to allow toileting in community setting.  Baseline: Eval: Pt limits community outings d/t requiring use of lift for [facility] transfers; 09/28/23: Pt has performed 2 successful scoot pivot  Transfers to toilet in OT clinic with min A, extra time.  Further trials with clothing management on toilet needed, but pt can lower and hike  pants while sititng edge of mat via lateral leaning and min A to maintain core stability; 11/13/23: Pt performs with CGA and extra time; 12/19/23: Pt   performs with close supv on most attempts, occasional CGA; efficiency is improving  Goal status: ongoing  5.  Pt will complete seated clothing management with min A to  enable voiding in handicapped stall within community. Baseline: Recert 11/13/23: Pt can transfer to toilet with increased time and effort, but  has not yet attempted clothing management or voiding while seated on commode in community setting.  Pt can lower pants to knees while seated in wc, but requires mod A to hike over hips from seated position; 12/20/23: Pt fully lowers panties and shorts while seated on commode with supv, can hike clothing to her her upper thighs, transfers back to wc, and then performs a tricep extension from wc level to allow caregiver to hike clothing remainder of the way for purposes of EC (min A for clothing management component) Goal status: achieved  6.  Pt will tolerate standing x1 min with close supv and BUE support to manage clothing in prep for toileting. Baseline: Eval: Currently standing in electric lift only, or within parallel bars without lift; 09/28/23: Not yet attempted; 11/13/23: Pt is able to perform static standing with heavy BUE support on parallel bars with PT; 12/19/22: Not attempted recently d/t new intermittent clonus in BLEs  Goal status: in progress  7.  Pt will increase bilat grip strength by (TBD) in order to securely grasp walker sufficiently for functional transfers and ambulation.  Baseline: Recert 11/13/23: TBD (PT note read following OT session, indicating pt with difficulty securing items in hand);  11/15/23: Grip strength: R: 70 lbs, L: 63 lbs; 12/25/23: R 70 lbs, L 65 lbs  Goal status: in progress  8.  Pt will increase bilat [facility] skills in order to manipulate small ADL supplies with reduced dropping as indicated by (TBD) improvement in 9 hole peg test. Baseline: Recert 11/13/23: 9 hole peg test TBD (PT note read following OT session, indicating pt with difficulty manipulating and securing items in hands); 11/15/23: Right: 25 sec, L 49 sec; 12/25/23: Right: 24 sec; Left: 3 trials: 54 sec, 1 min, 56 sec  Goal status: in progress  ASSESSMENT: CLINICAL IMPRESSION: Pt continues to demonstrate improvements in forward weight shifts in prep for sit to stand transfers, completing 10 successful forward  weight shifts from elevated cushion at wc level with min vc/visual demo.  Pt continues to improve with core stability, demonstrating ability to reach laterally to the L and R outside BOS with improving control to return to midline with not having to fall back into back rest of chair.  Pt reports that she can tell she's getting stronger and feels like using the standing frame in PT sessions is improving her LE strength.  Pt will continue to benefit from skilled OT to work towards above noted goals in OT poc, working to maximize indep with daily tasks while reducing burden of care on caregivers.   PERFORMANCE DEFICITS: in functional skills including ADLs, IADLs, strength, pain, flexibility, Gross motor control, mobility, balance, body mechanics, endurance, and decreased knowledge of use of DME, and psychosocial skills including coping strategies, environmental adaptation, habits, and routines and behaviors.   IMPAIRMENTS: are limiting patient from ADLs, IADLs, and social participation.   CO-MORBIDITIES: has co-morbidities such as neuropathy, back pain, obesity, MS, HTN that affects occupational performance. Patient will benefit from skilled OT to address above impairments and improve overall function.  MODIFICATION OR ASSISTANCE TO COMPLETE EVALUATION: No modification of tasks or assist necessary to complete an evaluation.  OT OCCUPATIONAL PROFILE AND HISTORY: Detailed assessment: Review of records and additional review of physical, cognitive, psychosocial history related to current functional performance.  CLINICAL DECISION MAKING: Moderate - several treatment options, min-mod task modification  necessary  REHAB POTENTIAL: Good  EVALUATION COMPLEXITY: Moderate  PLAN:  OT FREQUENCY: 2x/week  OT DURATION: 12 weeks  PLANNED INTERVENTIONS: 97168 OT Re-evaluation, 97535 self care/ADL training, 02889 therapeutic exercise, 97530 therapeutic activity, 97112 neuromuscular re-education, 97140 manual  therapy, 97116 gait training, 02989 moist heat, 97010 cryotherapy, balance training, functional mobility training, psychosocial skills training, energy conservation, coping strategies training, patient/family education, and DME and/or AE instructions  RECOMMENDED OTHER SERVICES: None at this time (Pt currently receiving PT services in this clinic)  CONSULTED AND AGREED WITH PLAN OF CARE: Patient  PLAN FOR NEXT SESSION: see above  Inocente Blazing, MS, OTR/L  Inocente MARLA Blazing, OT 01/08/2024, 11:37 AM

## 2024-01-09 ENCOUNTER — Encounter: Payer: Self-pay | Admitting: Internal Medicine

## 2024-01-09 ENCOUNTER — Ambulatory Visit: Admitting: Internal Medicine

## 2024-01-09 VITALS — BP 128/74 | HR 60 | Resp 16 | Ht 71.0 in | Wt 236.5 lb

## 2024-01-09 DIAGNOSIS — Z993 Dependence on wheelchair: Secondary | ICD-10-CM

## 2024-01-09 DIAGNOSIS — I10 Essential (primary) hypertension: Secondary | ICD-10-CM | POA: Diagnosis not present

## 2024-01-09 DIAGNOSIS — I471 Supraventricular tachycardia, unspecified: Secondary | ICD-10-CM | POA: Diagnosis not present

## 2024-01-09 DIAGNOSIS — I700 Atherosclerosis of aorta: Secondary | ICD-10-CM | POA: Diagnosis not present

## 2024-01-09 DIAGNOSIS — R7989 Other specified abnormal findings of blood chemistry: Secondary | ICD-10-CM

## 2024-01-09 DIAGNOSIS — M8080XD Other osteoporosis with current pathological fracture, unspecified site, subsequent encounter for fracture with routine healing: Secondary | ICD-10-CM

## 2024-01-09 DIAGNOSIS — E7800 Pure hypercholesterolemia, unspecified: Secondary | ICD-10-CM

## 2024-01-09 DIAGNOSIS — G35 Multiple sclerosis: Secondary | ICD-10-CM

## 2024-01-09 DIAGNOSIS — R739 Hyperglycemia, unspecified: Secondary | ICD-10-CM | POA: Diagnosis not present

## 2024-01-09 LAB — LIPID PANEL
Cholesterol: 136 mg/dL (ref 0–200)
HDL: 43.2 mg/dL (ref >39.00)
LDL Cholesterol: 78 mg/dL (ref 0–99)
NonHDL: 92.72
Total CHOL/HDL Ratio: 3
Triglycerides: 73 mg/dL (ref 0.0–149.0)
VLDL: 14.6 mg/dL (ref 0.0–40.0)

## 2024-01-09 LAB — HEPATIC FUNCTION PANEL
ALT: 10 U/L (ref 0–35)
AST: 12 U/L (ref 0–37)
Albumin: 3.9 g/dL (ref 3.5–5.2)
Alkaline Phosphatase: 119 U/L — ABNORMAL HIGH (ref 39–117)
Bilirubin, Direct: 0.1 mg/dL (ref 0.0–0.3)
Total Bilirubin: 0.5 mg/dL (ref 0.2–1.2)
Total Protein: 6.7 g/dL (ref 6.0–8.3)

## 2024-01-09 LAB — BASIC METABOLIC PANEL WITH GFR
BUN: 18 mg/dL (ref 6–23)
CO2: 30 meq/L (ref 19–32)
Calcium: 9.1 mg/dL (ref 8.4–10.5)
Chloride: 105 meq/L (ref 96–112)
Creatinine, Ser: 0.5 mg/dL (ref 0.40–1.20)
GFR: 100.66 mL/min (ref >60.00)
Glucose, Bld: 82 mg/dL (ref 70–99)
Potassium: 4 meq/L (ref 3.5–5.1)
Sodium: 140 meq/L (ref 135–145)

## 2024-01-09 LAB — CBC WITH DIFFERENTIAL/PLATELET
Basophils Absolute: 0 K/uL (ref 0.0–0.1)
Basophils Relative: 0.8 % (ref 0.0–3.0)
Eosinophils Absolute: 0.3 K/uL (ref 0.0–0.7)
Eosinophils Relative: 4.5 % (ref 0.0–5.0)
HCT: 38 % (ref 36.0–46.0)
Hemoglobin: 12.5 g/dL (ref 12.0–15.0)
Lymphocytes Relative: 16.7 % (ref 12.0–46.0)
Lymphs Abs: 1 K/uL (ref 0.7–4.0)
MCHC: 33 g/dL (ref 30.0–36.0)
MCV: 80.8 fl (ref 78.0–100.0)
Monocytes Absolute: 0.5 K/uL (ref 0.1–1.0)
Monocytes Relative: 7.6 % (ref 3.0–12.0)
Neutro Abs: 4.4 K/uL (ref 1.4–7.7)
Neutrophils Relative %: 70.4 % (ref 43.0–77.0)
Platelets: 216 K/uL (ref 150.0–400.0)
RBC: 4.7 Mil/uL (ref 3.87–5.11)
RDW: 15.3 % (ref 11.5–15.5)
WBC: 6.2 K/uL (ref 4.0–10.5)

## 2024-01-09 LAB — HEMOGLOBIN A1C: Hgb A1c MFr Bld: 6.1 % (ref 4.6–6.5)

## 2024-01-09 LAB — TSH: TSH: 23.43 u[IU]/mL — ABNORMAL HIGH (ref 0.35–5.50)

## 2024-01-09 MED ORDER — OXYBUTYNIN CHLORIDE ER 10 MG PO TB24: 10.0000 mg | ORAL_TABLET | Freq: Every day | ORAL | 1 refills | Status: DC

## 2024-01-09 MED ORDER — MELOXICAM 15 MG PO TABS: 15.0000 mg | ORAL_TABLET | Freq: Every day | ORAL | 0 refills | Status: AC

## 2024-01-09 MED ORDER — TELMISARTAN 40 MG PO TABS: 40.0000 mg | ORAL_TABLET | Freq: Every day | ORAL | 1 refills | Status: DC

## 2024-01-09 NOTE — Progress Notes (Signed)
 Subjective:    Patient ID: Lisa Williams, female    DOB: 28-Jun-1960, 63 y.o.   MRN: [id number]  Patient here for establish care appt.   HPI Here to establish care. Seeing Dr Vear - f/u MS. On baclofen  and tizanidine . Weakness in both legs - right worse then left. S/p fall last year. Broke her leg. Going to therapy. Uses a wheelchair. Current on mavenclad. Has urinary urgency - helped by oxybutynin . Recent treated with mavenclad. Has a history of hypertension and hypercholesterolemia. On crestor now. No chest pain or sob reported. Stopped smoking 2018. No abdominal pain or bowel change reported. Recent clonus. Improved. Last colonoscopy 2013. Last bone density 2019.    Past Medical History:  Diagnosis Date   Abdominal pain, right upper quadrant    Back pain    Calculus of kidney 12/09/2013   Chronic back pain    unspecified   Chronic left shoulder pain 07/19/2015   Complication of anesthesia    Functional disorder of bladder    other   Galactorrhea 11/26/2014   Chronic    Hereditary and idiopathic neuropathy 08/19/2013   History of kidney stones    HPV test positive    Hypercholesteremia 08/19/2013   Hypertension    Incomplete bladder emptying    Microscopic hematuria    MS (multiple sclerosis) (HCC)    Muscle spasticity 05/21/2014   Nonspecific findings on examination of urine    other   Osteopenia    PONV (postoperative nausea and vomiting)    Status post laparoscopic supracervical hysterectomy 11/26/2014   Tobacco user 11/26/2014   Wrist fracture    Past Surgical History:  Procedure Laterality Date   bilateral tubal ligation  1996   BREAST CYST EXCISION Left 2002   CYST EXCISION Left 05/10/2022   Procedure: CYST REMOVAL;  Surgeon: Rodolph Romano, MD;  Location: [facility];  Service: General;  Laterality: Left;   FRACTURE SURGERY     KNEE SURGERY     right   LAPAROSCOPIC SUPRACERVICAL HYSTERECTOMY  08/05/2013   ORIF WRIST FRACTURE Left 01/17/2017    Procedure: OPEN REDUCTION INTERNAL FIXATION (ORIF) WRIST FRACTURE;  Surgeon: Leora Lynwood SAUNDERS, MD;  Location: [facility];  Service: Orthopedics;  Laterality: Left;   RADIOLOGY WITH ANESTHESIA N/A 03/18/2020   Procedure: MRI WITH ANESTHESIA CERVICAL SPINE AND BRAIN  WITH AND WITHOUT CONTRAST;  Surgeon: Radiologist, Medication, MD;  Location: [facility];  Service: Radiology;  Laterality: N/A;   TUBAL LIGATION Bilateral    VAGINAL HYSTERECTOMY  03/2006   Family History  Problem Relation Age of Onset   Osteoarthritis Mother    Hypertension Mother    Hyperlipidemia Mother    Nephrolithiasis Father    Hypertension Father    Prostate cancer Father    Kidney disease Father    Heart failure Father    Sickle cell trait Sister    Hypertension Sister    Supraventricular tachycardia Sister    Hypertension Sister    Hypertension Sister    Diabetes Maternal Aunt    Breast cancer Maternal Aunt        great MAT   Diabetes Maternal Grandmother    Liver cancer Maternal Grandmother    Ovarian cancer Paternal Grandmother    Multiple sclerosis Cousin    GU problems Neg Hx    Urolithiasis Neg Hx    Social History   Socioeconomic History   Marital status: Single    Spouse name: Not on file   Number of children:  0   Years of education: 14   Highest education level: Not on file  Occupational History   Not on file  Tobacco Use   Smoking status: Former    Current packs/day: 0.50    Types: Cigarettes   Smokeless tobacco: Never  Vaping Use   Vaping status: Never Used  Substance and Sexual Activity   Alcohol use: No    Alcohol/week: 0.0 standard drinks of alcohol   Drug use: No   Sexual activity: Not Currently    Birth control/protection: Surgical  Other Topics Concern   Not on file  Social History Narrative   Lives at home with mom and sister,. Has a walker for ambulation/uses WC when necessary   Right Handed   Drinks no caffeine   Pt not working    Radiation protection practitioner Strain: Low Risk  (07/24/2023)   Received from [facility]   Overall Financial Resource Strain (CARDIA)    Difficulty of Paying Living Expenses: Not hard at all  Food Insecurity: No Food Insecurity (07/24/2023)   Received from [facility]   Hunger Vital Sign    Within the past 12 months, you worried that your food would run out before you got the money to buy more.: Never true    Within the past 12 months, the food you bought just didn't last and you didn't have money to get more.: Never true  Transportation Needs: No Transportation Needs (07/24/2023)   Received from [facility] - Transportation    In the past 12 months, has lack of transportation kept you from medical appointments or from getting medications?: No    Lack of Transportation (Non-Medical): No  Physical Activity: Not on file  Stress: Not on file  Social Connections: Not on file     Review of Systems  Constitutional:  Negative for appetite change and unexpected weight change.  HENT:  Negative for congestion and sinus pressure.   Respiratory:  Negative for cough, chest tightness and shortness of breath.   Cardiovascular:  Negative for chest pain and palpitations.       No increased swelling.   Gastrointestinal:  Negative for abdominal pain, diarrhea, nausea and vomiting.  Genitourinary:  Negative for difficulty urinating and dysuria.  Musculoskeletal:  Negative for joint swelling and myalgias.  Skin:  Negative for color change and rash.  Neurological:  Negative for dizziness and headaches.  Psychiatric/Behavioral:  Negative for agitation and dysphoric mood.        Objective:     BP 128/74   Pulse 60   Resp 16   Ht 5' 11 (1.803 m)   Wt 236 lb 8 oz (107.3 kg)   SpO2 99%   BMI 32.99 kg/m  Wt Readings from Last 3 Encounters:  01/09/24 236 lb 8 oz (107.3 kg)  09/05/23 250 lb (113.4 kg)  07/07/23 262 lb (118.8 kg)    Physical Exam Vitals  reviewed.  Constitutional:      General: She is not in acute distress.    Appearance: Normal appearance.  HENT:     Head: Normocephalic and atraumatic.     Right Ear: External ear normal.     Left Ear: External ear normal.  Eyes:     General: No scleral icterus.       Right eye: No discharge.        Left eye: No discharge.     Conjunctiva/sclera: Conjunctivae  normal.  Neck:     Thyroid : No thyromegaly.  Cardiovascular:     Rate and Rhythm: Normal rate and regular rhythm.  Pulmonary:     Effort: No respiratory distress.     Breath sounds: Normal breath sounds. No wheezing.  Abdominal:     General: Bowel sounds are normal.     Palpations: Abdomen is soft.     Tenderness: There is no abdominal tenderness.  Musculoskeletal:        General: No swelling or tenderness.     Cervical back: Neck supple. No tenderness.  Lymphadenopathy:     Cervical: No cervical adenopathy.  Skin:    Findings: No erythema or rash.  Neurological:     Mental Status: She is alert.  Psychiatric:        Mood and Affect: Mood normal.        Behavior: Behavior normal.         Outpatient Encounter Medications as of 01/09/2024  Medication Sig   acetaminophen  (TYLENOL ) 500 MG tablet Take 500-1,000 mg by mouth every 6 (six) hours as needed for moderate pain or headache.   baclofen  (LIORESAL ) 20 MG tablet Take 1 tablet (20 mg total) by mouth 4 (four) times daily.   Cholecalciferol  25 MCG (1000 UT) tablet Take 5,000 Units by mouth daily.   MAVENCLAD, 10 TABS, 10 MG TBPK Take by mouth.   rosuvastatin (CRESTOR) 10 MG tablet Take 10 mg by mouth at bedtime.   tiZANidine  (ZANAFLEX ) 4 MG tablet TAKE ONE BY MOUTH EVERY MORNING ONE BY MOUTH EVERY EVENING AND TWO BY MOUTH AT BEDTIME   [DISCONTINUED] meloxicam  (MOBIC ) 15 MG tablet Take 15 mg by mouth daily.   [DISCONTINUED] oxybutynin  (DITROPAN -XL) 10 MG 24 hr tablet Take 1 tablet (10 mg total) by mouth daily.   [DISCONTINUED] telmisartan  (MICARDIS ) 40 MG tablet Take  40 mg by mouth daily.   meloxicam  (MOBIC ) 15 MG tablet Take 1 tablet (15 mg total) by mouth daily.   oxybutynin  (DITROPAN -XL) 10 MG 24 hr tablet Take 1 tablet (10 mg total) by mouth daily.   telmisartan  (MICARDIS ) 40 MG tablet Take 1 tablet (40 mg total) by mouth daily.   [DISCONTINUED] polyethylene glycol (MIRALAX ) 17 g packet Take 17 g by mouth 2 (two) times daily. (Patient not taking: Reported on 12/17/2023)   [DISCONTINUED] Siponimod  Fumarate (MAYZENT ) 2 MG TABS Take 1 tablet (2 mg total) by mouth daily. (Patient not taking: Reported on 12/17/2023)   No facility-administered encounter medications on file as of 01/09/2024.     Lab Results  Component Value Date   WBC 6.2 01/09/2024   HGB 12.5 01/09/2024   HCT 38.0 01/09/2024   PLT 216.0 01/09/2024   GLUCOSE 82 01/09/2024   CHOL 136 01/09/2024   TRIG 73.0 01/09/2024   HDL 43.20 01/09/2024   LDLCALC 78 01/09/2024   ALT 10 01/09/2024   AST 12 01/09/2024   NA 140 01/09/2024   K 4.0 01/09/2024   CL 105 01/09/2024   CREATININE 0.50 01/09/2024   BUN 18 01/09/2024   CO2 30 01/09/2024   TSH 23.43 (H) 01/09/2024   INR 1.1 11/14/2021   HGBA1C 6.1 01/09/2024    US  Abdomen Limited RUQ (LIVER/GB) Result Date: 09/28/2023 CLINICAL DATA:  Abnormal liver function test. EXAM: ULTRASOUND ABDOMEN LIMITED RIGHT UPPER QUADRANT COMPARISON:  July 07, 2023 FINDINGS: Gallbladder: No gallstones or wall thickening visualized. No sonographic Murphy sign noted by sonographer. Common bile duct: Diameter: 1.4 mm. Liver: No focal lesion identified. Within  normal limits in parenchymal echogenicity. Portal vein is patent on color Doppler imaging with normal direction of blood flow towards the liver. Other: None. IMPRESSION: Normal right upper quadrant ultrasound. Electronically Signed   By: Craig Farr M.D.   On: 09/28/2023 12:25       Assessment & Plan:  Essential hypertension Assessment & Plan: Currently taking micardis . Blood pressure as outlined. Follow  pressures. Follow metabolic panel.   Orders: -     Basic metabolic panel with GFR  Multiple sclerosis (HCC) -     CBC with Differential/Platelet  Hypercholesteremia Assessment & Plan: Continue crestor. Low cholesterol diet and exercise. Follow lipid panel.   Orders: -     Lipid panel -     Hepatic function panel -     TSH  Hyperglycemia Assessment & Plan: Low carb diet and exercise. Follow met b and A1c.   Orders: -     Hemoglobin A1c  Abnormal LFTs Assessment & Plan: Recently found to be elevated. Recent abdominal ultrasound - ok. Recheck liver panel today.    Aortic atherosclerosis (HCC) Assessment & Plan: Continue crestor.    Localized osteoporosis with current pathological fracture with routine healing Assessment & Plan: Documented history of osteoporosis. Recent fracture. Discussed need for f/u bone density in the future.    MS (multiple sclerosis) [facility]) Assessment & Plan: Seeing Dr Vear. Continue working with PT. Continue mavenclad.    SVT (supraventricular tachycardia) (HCC) Assessment & Plan: Documented history. Pulse rate 50-60. Follow. Has seen cardiology previously. Has had previous stress test and ECHO. Follow.    Wheelchair dependence Assessment & Plan: Conitnues to work with PT.    Other orders -     Telmisartan ; Take 1 tablet (40 mg total) by mouth daily.  Dispense: 90 tablet; Refill: 1 -     oxyBUTYnin  Chloride ER; Take 1 tablet (10 mg total) by mouth daily.  Dispense: 90 tablet; Refill: 1 -     Meloxicam ; Take 1 tablet (15 mg total) by mouth daily.  Dispense: 30 tablet; Refill: 0     Allena Hamilton, MD

## 2024-01-10 ENCOUNTER — Ambulatory Visit: Payer: Self-pay | Admitting: Internal Medicine

## 2024-01-10 ENCOUNTER — Ambulatory Visit: Payer: PPO

## 2024-01-10 ENCOUNTER — Ambulatory Visit

## 2024-01-10 DIAGNOSIS — G35 Multiple sclerosis: Secondary | ICD-10-CM

## 2024-01-10 DIAGNOSIS — R2681 Unsteadiness on feet: Secondary | ICD-10-CM

## 2024-01-10 DIAGNOSIS — M6281 Muscle weakness (generalized): Secondary | ICD-10-CM

## 2024-01-10 DIAGNOSIS — R278 Other lack of coordination: Secondary | ICD-10-CM

## 2024-01-10 DIAGNOSIS — R2689 Other abnormalities of gait and mobility: Secondary | ICD-10-CM

## 2024-01-10 DIAGNOSIS — R269 Unspecified abnormalities of gait and mobility: Secondary | ICD-10-CM

## 2024-01-10 DIAGNOSIS — E7800 Pure hypercholesterolemia, unspecified: Secondary | ICD-10-CM

## 2024-01-10 DIAGNOSIS — R262 Difficulty in walking, not elsewhere classified: Secondary | ICD-10-CM

## 2024-01-10 MED ORDER — LEVOTHYROXINE SODIUM 50 MCG PO TABS: 50.0000 ug | ORAL_TABLET | Freq: Every day | ORAL | 0 refills | Status: DC

## 2024-01-10 NOTE — Therapy (Addendum)
 OUTPATIENT PHYSICAL THERAPY NEURO TREATMENT/PHYSICAL THERAPY PROGRESS NOTE   Dates of reporting period  12/06/23   to   01/10/24     Patient Name: Lisa Williams MRN: [id number] DOB:04-Nov-1960, 63 y.o., female Today's Date: 01/10/2024   PCP: Lenon Na  REFERRING PROVIDER: Lenon Na  END OF SESSION:  PT End of Session - 01/10/24 1018     Visit Number 50    Number of Visits 70    Date for PT Re-Evaluation 03/20/24   corrected   Progress Note Due on Visit 50    PT Start Time 1018    PT Stop Time 1100    PT Time Calculation (min) 42 min    Equipment Utilized During Treatment Gait belt    Activity Tolerance Patient tolerated treatment well;No increased pain    Behavior During Therapy [facility] for tasks assessed/performed         Past Medical History:  Diagnosis Date   Abdominal pain, right upper quadrant    Back pain    Calculus of kidney 12/09/2013   Chronic back pain    unspecified   Chronic left shoulder pain 07/19/2015   Complication of anesthesia    Functional disorder of bladder    other   Galactorrhea 11/26/2014   Chronic    Hereditary and idiopathic neuropathy 08/19/2013   History of kidney stones    HPV test positive    Hypercholesteremia 08/19/2013   Hypertension    Incomplete bladder emptying    Microscopic hematuria    MS (multiple sclerosis) (HCC)    Muscle spasticity 05/21/2014   Nonspecific findings on examination of urine    other   Osteopenia    PONV (postoperative nausea and vomiting)    Status post laparoscopic supracervical hysterectomy 11/26/2014   Tobacco user 11/26/2014   Wrist fracture    Past Surgical History:  Procedure Laterality Date   bilateral tubal ligation  1996   BREAST CYST EXCISION Left 2002   CYST EXCISION Left 05/10/2022   Procedure: CYST REMOVAL;  Surgeon: Rodolph Romano, MD;  Location: [facility] ORS;  Service: General;  Laterality: Left;   FRACTURE SURGERY     KNEE SURGERY     right    LAPAROSCOPIC SUPRACERVICAL HYSTERECTOMY  08/05/2013   ORIF WRIST FRACTURE Left 01/17/2017   Procedure: OPEN REDUCTION INTERNAL FIXATION (ORIF) WRIST FRACTURE;  Surgeon: Leora Lynwood SAUNDERS, MD;  Location: [facility] ORS;  Service: Orthopedics;  Laterality: Left;   RADIOLOGY WITH ANESTHESIA N/A 03/18/2020   Procedure: MRI WITH ANESTHESIA CERVICAL SPINE AND BRAIN  WITH AND WITHOUT CONTRAST;  Surgeon: Radiologist, Medication, MD;  Location: [facility] OR;  Service: Radiology;  Laterality: N/A;   TUBAL LIGATION Bilateral    VAGINAL HYSTERECTOMY  03/2006   Patient Active Problem List   Diagnosis Date Noted   Hyperglycemia 01/09/2024   Abnormal LFTs (liver function tests) 09/17/2023   Sinus tachycardia 07/07/2023   Aortic atherosclerosis (HCC) 01/18/2023   Prediabetes 01/18/2023   Hypokalemia 07/29/2022   Weakness of both lower extremities 07/29/2022   Abscess of left groin 11/15/2021   Abnormal LFTs 11/15/2021   Acute respiratory disease due to COVID-19 virus 11/21/2020   Weakness    Hypoalbuminemia due to protein-calorie malnutrition [facility])    Neurogenic bowel    Neurogenic bladder    Labile blood pressure    Neuropathic pain    Multiple sclerosis, relapsing-remitting (HCC) 07/09/2019   Abscess of female pelvis    SVT (supraventricular tachycardia) (HCC)    Radial styloid tenosynovitis  03/12/2018   Wheelchair dependence 02/27/2018   Localized osteoporosis with current pathological fracture with routine healing 01/19/2017   Wrist fracture 01/16/2017   Sprain of ankle 03/23/2016   Closed fracture of lateral malleolus 03/16/2016   Health care maintenance 01/24/2016   Blood pressure elevated without history of HTN 10/25/2015   Essential hypertension 10/25/2015   Multiple sclerosis (HCC) 10/02/2015   Chronic left shoulder pain 07/19/2015   Multiple sclerosis exacerbation (HCC) 07/14/2015   MS (multiple sclerosis) (HCC) 11/26/2014   Increased body mass index 11/26/2014   HPV test positive 11/26/2014    Status post laparoscopic supracervical hysterectomy 11/26/2014   Galactorrhea 11/26/2014   Back ache 05/21/2014   Adiposity 05/21/2014   Disordered sleep 05/21/2014   Muscle spasticity 05/21/2014   Spasticity 05/21/2014   Calculus of kidney 12/09/2013   Renal colic 12/09/2013   Hypercholesteremia 08/19/2013   Hereditary and idiopathic neuropathy 08/19/2013   Hypercholesterolemia without hypertriglyceridemia 08/19/2013   Bladder infection, chronic 07/25/2012   Disorder of bladder function 07/25/2012   Incomplete bladder emptying 07/25/2012   Microscopic hematuria 07/25/2012   Right upper quadrant pain 07/25/2012    ONSET DATE: 1995  REFERRING DIAG: MS  THERAPY DIAG:  Muscle weakness (generalized)  Other lack of coordination  Multiple sclerosis exacerbation (HCC)  Difficulty in walking, not elsewhere classified  Unsteadiness on feet  Abnormality of gait and mobility  Other abnormalities of gait and mobility  Rationale for Evaluation and Treatment: Rehabilitation  SUBJECTIVE:                                                                                                                                                                                             SUBJECTIVE STATEMENT:  Pt reports doing well with no new issues since last visit. Pt ready to attempt standing goal at this time.   Pt accompanied by: self  PERTINENT HISTORY:  Patient is returning to PT s/p hospitalization.  s/p ORIF of R tibia shaft fracture 09/23/2022. Patient has weakness in BLE with RLE>LLE. She drives with hand controls. Patient has been diagnosed with MS in 1995. PMH includes: back pain, CBP, chronic L shoulder pain, galactorrhea, neuropathy, HPV, hypercholestermeia, HTN, MS, osteopenia, PONV, wrist fracture. Additional order for other closed fracture of proximal end of R tibia with routine healing. Still has to use [employer].   PAIN:  Are you having pain? Occasional pain in RLE; primarily  in knee  PRECAUTIONS: Fall  RED FLAGS: None   WEIGHT BEARING RESTRICTIONS: No  FALLS: Has patient fallen in last 6 months? No  LIVING ENVIRONMENT: Lives with: lives with their family Lives in:  House/apartment Stairs: ramp Has following equipment at home: Vannie - 2 wheeled, Wheelchair (manual), shower chair, and Grab bars  PLOF: Independent with household mobility with device  PATIENT GOALS: to get her independence back. To be able to get into car, toilet, and get dressed independently  OBJECTIVE:  Note: Objective measures were completed at Evaluation unless otherwise noted.  DIAGNOSTIC FINDINGS: MRI of the brain 03/18/2020 showed multiple T2/FLAIR hyperintense foci in the periventricular, juxtacortical and deep white matter.  There were no infratentorial lesions noted.  None of the foci enhanced.   Due to severe claustrophobia, the study was done with conscious sedation in the hospital   COGNITION: Overall cognitive status: Within functional limits for tasks assessed   SENSATION: Lack of sensation in feet, loss in bilateral lateral aspect of knee  COORDINATION: Does not have the strength for functional LE heel slide test   MUSCLE TONE: BLE mild tone    POSTURE: rounded shoulders, forward head, anterior pelvic tilt, and weight shift left   LOWER EXTREMITY MMT:    MMT Right Eval Left Eval  Hip flexion 0.9 1.5  Hip extension    Hip abduction 1.8 2.6  Hip adduction 3.1 1.9  Hip internal rotation    Hip external rotation    Knee flexion 0.9 1.5  Knee extension 0.8 1.2  Ankle dorsiflexion    Ankle plantarflexion    Ankle inversion    Ankle eversion    (Blank rows = not tested)  BED MOBILITY:  Assess in future session due to limited time  TRANSFERS: Assistive device utilized: Bariatric RW with sit to stands, slide board to table  Sit to stand: unable to reach full stand Stand to sit: unable to reach full stand  Chair to chair: slide board with CGA    GAIT: Unable to ambulate at this time.   FUNCTIONAL TESTS:  Sit to stand: tricep press with BUE; unable to bring arm to walker.    Function In Sitting Test (FIST)  (1/2 femur on surface; hips/knees flexed to 90deg)   - indicate bed or mat table / step stool if used  SCORING KEY: 4 = Independent (completes task independently & successfully) 3 = Verbal Cues/Increased Time (completes task independently & successfully and only needs more time/cues) 2 = Upper Extremity Support (must use UE for support or assistance to complete successfully) 1 = Needs Assistance (unable to complete w/o physical assist; DOCUMENT LEVEL: min, mod, max) 0 = Dependent (requires complete physical assist; unable to complete successfully even w/ physical assist)  Randomly Administer Once Throughout Exam  4 - Anterior Nudge (superior sternum)  4 - Posterior Nudge (between scapular spines)  4 - Lateral Nudge (to dominant side at acromion)     4 - Static sitting (30 seconds)  4 - Sitting, shake 'no' (left and right)  4 - Sitting, eyes closed (30 seconds)   0 - Sitting, lift foot (dominant side, lift foot 1 inch twice)    2 - Pick up object from behind (object at midline, hands breadth posterior)  3 - Forward reach (use dominant arm, must complete full motion) 2 - Lateral reach (use dominant arm, clear opposite ischial tuberosity) 2 - Pick up object from floor (from between feet)   2 - Posterior scooting (move backwards 2 inches)  2 - Anterior scooting (move forward 2 inches)  2 - Lateral scooting (move to dominant side 2 inches)    TOTAL = 39/56  Notes/comments: slide board tranfer to/from table  MCD > 5 points MCID for IP REHAB > 6 points  Function In Sitting Test (FIST) 08/14/23 (1/2 femur on surface; hips/knees flexed to 90deg)   - indicate bed or mat table / step stool if used  SCORING KEY: 4 = Independent (completes task independently & successfully) 3 = Verbal Cues/Increased Time (completes  task independently & successfully and only needs more time/cues) 2 = Upper Extremity Support (must use UE for support or assistance to complete successfully) 1 = Needs Assistance (unable to complete w/o physical assist; DOCUMENT LEVEL: min, mod, max) 0 = Dependent (requires complete physical assist; unable to complete successfully even w/ physical assist)  Randomly Administer Once Throughout Exam  4 - Anterior Nudge (superior sternum)  4 - Posterior Nudge (between scapular spines)  4 - Lateral Nudge (to dominant side at acromion)     4 - Static sitting (30 seconds)  4 - Sitting, shake 'no' (left and right)  4 - Sitting, eyes closed (30 seconds)   0 - Sitting, lift foot (dominant side, lift foot 1 inch twice)    4 - Pick up object from behind (object at midline, hands breadth posterior)  4 - Forward reach (use dominant arm, must complete full motion) 4 - Lateral reach (use dominant arm, clear opposite ischial tuberosity)  - Pick up object from floor (from between feet)   3 - Posterior scooting (move backwards 2 inches)  3 - Anterior scooting (move forward 2 inches)  3 - Lateral scooting (move to dominant side 2 inches)    TOTAL = 47/56  MCD > 5 points MCID for IP REHAB > 6 points                                                                                                                              TREATMENT DATE: 01/01/24  TherAct: To improve functional movements patterns for everyday tasks  Standing frame (using the Strap stand): Patient able to push up from chair to minimally raise her bottom to allow author to position gluteal padding underneath her.   Dependent with foot straps and locking of frame. Patient initially performed standing pumping up using her RUE and therapist assisted on the second attempt due to strap along the gluteal region being displaced and not allowing for pt to be as upright as necessary.  Pt was then able to stand in near complete erect standing  and performed dynamic activities:  Standing glute squeezes/activation, x10 Standing ball toss, 60 sec bouts with pt performing with elbows on shelf during initial phases, and transitioning to no UE support and catching/throwing ball   STS in // bars, and maxA from therapist, x4 total attempts for standing Static standing with UE support on // bars, pt able to achieve 1 min 38 sec and longest bout Attempted to perform a few steps in the // bars, however unable to perform due to fatigue at this time.    PATIENT EDUCATION: Education details:  Pt educated throughout session about proper posture and technique with exercises. Improved exercise technique, movement at target joints, use of target muscles after min to mod verbal, visual, tactile cues.  Person educated: Patient Education method: Explanation, Demonstration, Tactile cues, and Verbal cues Education comprehension: verbalized understanding, returned demonstration, verbal cues required, tactile cues required, and needs further education  HOME EXERCISE PROGRAM: Stand 3x/day in stander   GOALS: Goals reviewed with patient? Yes  SHORT TERM GOALS: Target date: 08/08/2023  Patient will be independent in home exercise program to improve strength/mobility for better functional independence with ADLs.  Baseline: 4/8: compliance Goal status: MET    LONG TERM GOALS: Target date: 03/20/2024  Patient will tolerate five consecutive stands with UE support from wheelchair to standing to improve functional mobility with additional cushion and RW.  Baseline: unable to perform 4/8:unable to perform full stand 5/20: perform next session 5/29: two full stands with CGA and cushion on her seat 7/31: able to stand 3 times from [facility]. With no addition cushion  in parallel bars. 12/27/2023= Will retest next session- concentrated on just standing on past 2 visits Goal status: Ongoing   2.  Patient will ambulate 10 ft with bRW with wheelchair follow.  Baseline:   unable to ambulate 4/8: unable to ambulate 5/20: able to ambulate length of // bars in previous session 5/27: ambulate 2.5 lengths of // bars with two seated rest breaks with min A for weight shift and wheelchair follow  7/31: able to ambulate in parallel bars 3 ft with min assist and +2 follow in WC> difficulty advancing the LLE on this day. 12/27/2023- Patient unable to take a step forward today but was abel to take a step on previous visit- limited by clonus like trembling that has progressed over past month. Goal status: Ongoing   3.  Patient will improve FIST score >6 points to demonstrate improved stability and ability to perform ADLs.  Baseline:  3/6: 39/56 4/8: 47/ 56 5/20: 48/56 7/31:46/56 Goal status: MET  4.  Patient will perform toileting with mod I at home for improved independence.  Baseline: 3/6: requires assistance 4/8: requires dependence on machine 5/20: requires assistance with use of wipe buddy.  7/31: continues to require use of sara steady and wipe buddy for safety and hygiene. 12/27/2023- Patient reports that since she is unable to take steps that she continues to require use of sara steady and wipe buddy for safe personal hygiene. Goal status: ongoing   5. Patient will perform > 5 min of static or dynamic standing with BUE and Min/mod assist  for improved LE strength and pre-gait function to assist with toileting and overall standing ability for improve functional independence.  Baseline: 12/27/2023-Patient was able to stand for just over 3 min today. 01/10/24: Pt able to stand 1 min 38 sec Goal status: NOT PROGRESSING    ASSESSMENT:  CLINICAL IMPRESSION:  Pt arrived to therapy and ready to participate in progress report.  Pt was assessed for new/current stand goal and was able to achieve 1 min 38 sec of stance after multiple attempts.  Pt noted a reduction in her ability due to the braces that she is currently utilizing.  Pt feels as though she is leaning forward in the  new braces and that causes her to shift backwards more and caused her to prematurely lose balance and fatigue causing her to sit.  Pt wants to switch back to her other braces before attempting more goals at the next session.  Patient's condition has the potential to improve in response to therapy. Maximum improvement is yet to be obtained. The anticipated improvement is attainable and reasonable in a generally predictable time.   Pt will continue to benefit from skilled therapy to address remaining deficits in order to improve overall QoL and return to PLOF.        OBJECTIVE IMPAIRMENTS: Abnormal gait, cardiopulmonary status limiting activity, decreased activity tolerance, decreased balance, decreased coordination, decreased endurance, decreased mobility, difficulty walking, decreased ROM, decreased strength, hypomobility, increased fascial restrictions, impaired perceived functional ability, impaired flexibility, impaired sensation, improper body mechanics, and postural dysfunction.   ACTIVITY LIMITATIONS: carrying, lifting, bending, sitting, standing, squatting, sleeping, stairs, transfers, bed mobility, continence, bathing, toileting, dressing, self feeding, reach over head, hygiene/grooming, locomotion level, and caring for others  PARTICIPATION LIMITATIONS: meal prep, cleaning, laundry, interpersonal relationship, driving, shopping, community activity, and church  PERSONAL FACTORS: Age, Past/current experiences, Time since onset of injury/illness/exacerbation, Transportation, and 3+ comorbidities: ack pain, CBP, chronic L shoulder pain, galactorrhea, neuropathy, HPV, hypercholestermeia, HTN, MS, osteopenia, PONV, wrist fracture are also affecting patient's functional outcome.   REHAB POTENTIAL: Good  CLINICAL DECISION MAKING: Evolving/moderate complexity  EVALUATION COMPLEXITY: Moderate  PLAN:  PT FREQUENCY: 2x/week  PT DURATION: 12 weeks  PLANNED INTERVENTIONS: [id number]- PT  Re-evaluation, 97110-Therapeutic exercises, 97530- Therapeutic activity, 97112- Neuromuscular re-education, 97535- Self Care, [id number]- Manual therapy, [phone]- Gait training, [phone]- Orthotic Fit/training, [phone]- Canalith repositioning, [id number]- Aquatic Therapy, [phone]- Electrical stimulation (unattended), [phone]- Electrical stimulation (manual), [id number]- Ultrasound, [id number]- Traction (mechanical), Patient/Family education, Balance training, Stair training, Taping, Dry Needling, Joint mobilization, Joint manipulation, Spinal manipulation, Spinal mobilization, Scar mobilization, Compression bandaging, Vestibular training, Visual/preceptual remediation/compensation, Cognitive remediation, DME instructions, Cryotherapy, Moist heat, and Biofeedback  PLAN FOR NEXT SESSION:  Progress report due next visit- Reassess goals Strap stand frame with more dynamic activities Sit to stands; try supported walking, continue dynamic core stabilization training and seated reaching tasks to improve weight shifting.    Fonda Simpers, PT, DPT Physical Therapist - [facility]  01/10/24, 6:23 PM

## 2024-01-10 NOTE — Therapy (Signed)
 OUTPATIENT OCCUPATIONAL THERAPY NEURO TREATMENT NOTE  Patient Name: Lisa Williams MRN: [id number] DOB:[birth year], 63 y.o., female Today's Date: 01/10/2024  PCP: Dr. Layman Piety   Neurologist Dr. Suanne at [facility] REFERRING PROVIDER: Dr. Layman Piety    END OF SESSION:  OT End of Session - 01/10/24 1144     Visit Number 36    Number of Visits 44    Date for OT Re-Evaluation 02/05/24    Progress Note Due on Visit 40    OT Start Time 1100    OT Stop Time 1145    OT Time Calculation (min) 45 min    Equipment Utilized During Treatment manual wc    Activity Tolerance Patient tolerated treatment well    Behavior During Therapy [facility] for tasks assessed/performed         Past Medical History:  Diagnosis Date   Abdominal pain, right upper quadrant    Back pain    Calculus of kidney 12/09/2013   Chronic back pain    unspecified   Chronic left shoulder pain 07/19/2015   Complication of anesthesia    Functional disorder of bladder    other   Galactorrhea 11/26/2014   Chronic    Hereditary and idiopathic neuropathy 08/19/2013   History of kidney stones    HPV test positive    Hypercholesteremia 08/19/2013   Hypertension    Incomplete bladder emptying    Microscopic hematuria    MS (multiple sclerosis) (HCC)    Muscle spasticity 05/21/2014   Nonspecific findings on examination of urine    other   Osteopenia    PONV (postoperative nausea and vomiting)    Status post laparoscopic supracervical hysterectomy 11/26/2014   Tobacco user 11/26/2014   Wrist fracture    Past Surgical History:  Procedure Laterality Date   bilateral tubal ligation  1996   BREAST CYST EXCISION Left 2002   CYST EXCISION Left 05/10/2022   Procedure: CYST REMOVAL;  Surgeon: Rodolph Romano, MD;  Location: [facility] ORS;  Service: General;  Laterality: Left;   FRACTURE SURGERY     KNEE SURGERY     right   LAPAROSCOPIC SUPRACERVICAL HYSTERECTOMY  08/05/2013   ORIF WRIST FRACTURE Left  01/17/2017   Procedure: OPEN REDUCTION INTERNAL FIXATION (ORIF) WRIST FRACTURE;  Surgeon: Leora Lynwood SAUNDERS, MD;  Location: [facility] ORS;  Service: Orthopedics;  Laterality: Left;   RADIOLOGY WITH ANESTHESIA N/A 03/18/2020   Procedure: MRI WITH ANESTHESIA CERVICAL SPINE AND BRAIN  WITH AND WITHOUT CONTRAST;  Surgeon: Radiologist, Medication, MD;  Location: [facility] OR;  Service: Radiology;  Laterality: N/A;   TUBAL LIGATION Bilateral    VAGINAL HYSTERECTOMY  03/2006   Patient Active Problem List   Diagnosis Date Noted   Hyperglycemia 01/09/2024   Abnormal LFTs (liver function tests) 09/17/2023   Sinus tachycardia 07/07/2023   Aortic atherosclerosis (HCC) 01/18/2023   Prediabetes 01/18/2023   Hypokalemia 07/29/2022   Weakness of both lower extremities 07/29/2022   Abscess of left groin 11/15/2021   Abnormal LFTs 11/15/2021   Acute respiratory disease due to COVID-19 virus 11/21/2020   Weakness    Hypoalbuminemia due to protein-calorie malnutrition [facility])    Neurogenic bowel    Neurogenic bladder    Labile blood pressure    Neuropathic pain    Multiple sclerosis, relapsing-remitting (HCC) 07/09/2019   Abscess of female pelvis    SVT (supraventricular tachycardia) (HCC)    Radial styloid tenosynovitis 03/12/2018   Wheelchair dependence 02/27/2018   Localized osteoporosis with current pathological fracture  with routine healing 01/19/2017   Wrist fracture 01/16/2017   Sprain of ankle 03/23/2016   Closed fracture of lateral malleolus 03/16/2016   Health care maintenance 01/24/2016   Blood pressure elevated without history of HTN 10/25/2015   Essential hypertension 10/25/2015   Multiple sclerosis (HCC) 10/02/2015   Chronic left shoulder pain 07/19/2015   Multiple sclerosis exacerbation (HCC) 07/14/2015   MS (multiple sclerosis) (HCC) 11/26/2014   Increased body mass index 11/26/2014   HPV test positive 11/26/2014   Status post laparoscopic supracervical hysterectomy 11/26/2014   Galactorrhea  11/26/2014   Back ache 05/21/2014   Adiposity 05/21/2014   Disordered sleep 05/21/2014   Muscle spasticity 05/21/2014   Spasticity 05/21/2014   Calculus of kidney 12/09/2013   Renal colic 12/09/2013   Hypercholesteremia 08/19/2013   Hereditary and idiopathic neuropathy 08/19/2013   Hypercholesterolemia without hypertriglyceridemia 08/19/2013   Bladder infection, chronic 07/25/2012   Disorder of bladder function 07/25/2012   Incomplete bladder emptying 07/25/2012   Microscopic hematuria 07/25/2012   Right upper quadrant pain 07/25/2012   ONSET DATE: 5/24 Amon and broke R leg which caused beginning of ADL decline; diagnosed with MS in 1995)  REFERRING DIAG: MS  THERAPY DIAG:  Muscle weakness (generalized)  Other lack of coordination  Multiple sclerosis exacerbation (HCC)  Rationale for Evaluation and Treatment: Rehabilitation  SUBJECTIVE:  SUBJECTIVE STATEMENT: Pt reports being tired today. Pt accompanied by: self  PERTINENT HISTORY: Pt reports increased difficulty with basic self care tasks since breaking her R leg last May.  Since that time, mobility and ADLs have declined, with pt requiring assist from private caregivers and family to manage bathing, toileting, dressing, and functional transfers.  PRECAUTIONS: Fall  WEIGHT BEARING RESTRICTIONS: No  PAIN: 01/10/24: No pain  Are you having pain? No; occasional pain in R lower back, but not today  FALLS: Has patient fallen in last 6 months? No Last fall was May 17 of 2024  LIVING ENVIRONMENT: Lives with: lives with their family (including mother who has dementia and sister [name])  Lives in: 2 level home but pt resides on main level Stairs: ramp  Has following equipment at home: Wheelchair (manual), Shower bench, and hand held shower shower, 1 grab bar in the shower, 3in1 commode, sit to stand lift (electric), FWW, hospital bed  PLOF: modified indep with ADL/IADLs prior to May of 7975   PATIENT GOALS: Increase  independence with basic self care tasks  OBJECTIVE:  Note: Objective measures were completed at Evaluation unless otherwise noted.  HAND DOMINANCE: Right  ADLs:  Overall ADLs: assist provided from paid caregiver and sister Transfers/ambulation related to ADLs: sit to stand lift for all transfers, set up for sliding board in/out of bed  Eating: indep  Grooming: modified indep (wc level in mom's bathroom)  UB Dressing: set up (can't access her bedroom closet)  LB Dressing: able to sit on side of bed to don all LB clothing; assist to hike pants/underwear Toileting: assist with clothing management d/t standing within electric lift Bathing: bed bath with sister helping to wash backside Tub Shower transfers: N/A; pt has walk in shower in mom's bathroom (wc does fit in this bathroom but pt reports inability to transfer from wc and requires arm rests on a bench for a successful transfer attempt from any DME).  Tub bench is in pt's bathroom, but lift does not fit through bathroom doorway and pt reports inability to transfer up from tub bench (currently unable to manage either transfer)   Equipment:  see above  IADLs: Shopping: [employer] for shopping Light housekeeping: modified indep from wc level Meal Prep: modified indep from wc level Community mobility: relies on community transportation or family members Medication management: indep Landscape architect: indep Handwriting: NT; pt denies any FMC challenges  MOBILITY STATUS: Hx of falls  POSTURE COMMENTS:  Rounded shoulders, forward head, anterior pelvic tilt, and weight shift left   ACTIVITY TOLERANCE: Activity tolerance: Per PT; currently standing 30-60 sec within Light Gait harness/lift; currently non-ambulatory  UPPER EXTREMITY ROM:  BUEs WFL  UPPER EXTREMITY MMT:     MMT Right eval Left eval  Shoulder flexion 4+ 4  Shoulder abduction 4+ 4  Shoulder adduction    Shoulder extension    Shoulder internal rotation 4+ 4+   Shoulder external rotation 4 4  Middle trapezius    Lower trapezius    Elbow flexion 4+ 4+  Elbow extension 4+ 4+  Wrist flexion 4+ 4+  Wrist extension 4+ 4+  Wrist ulnar deviation    Wrist radial deviation    Wrist pronation    Wrist supination    (Blank rows = not tested)  HAND FUNCTION/COORDINATION:  R/L WNL; pt denies any coordination deficits/ able to manipulate pills/clothing fasteners/etc without difficulty  11/15/23: *Measures taken d/t PT note revealing pt with increased difficulty manipulating smaller ADL supplies during reaching activities in PT session   11/15/23: Grip strength: R: 70 lbs, L: 63 lbs Pinch strength: Lateral: R: 16 lbs, L: 15 lbs; 3 point pinch: R: 12 lbs, L: 13 lbs 9 hole peg test: Right: 25 sec, L 49 sec   12/25/23: Grip strength: Right: 70 lbs; Left: 65 lbs  9 hole peg test: Right: 24 sec; Left: 3 trials: 54 sec, 1 min, 56 sec  SENSATION: WFL  EDEMA: No visible edema in BUEs  MUSCLE TONE: BUEs WNL  COGNITION: Overall cognitive status: Within functional limits for tasks assessed  VISION: wears glasses all the time, no reports of diplopia  PERCEPTION: Not tested  PRAXIS: WFL  OBSERVATIONS:  Pt pleasant, cooperative, and motivated to work towards improving indep with ADLs.                                                                                                     TREATMENT DATE: 01/10/24 Self Care: -Sit to stand prep for ADL transfers: Pt practiced forward weight shift to achieve partial sit to stand from wc level, knees blocked with pillow cushioning edge of chair in front of her (chair stabilized by wall) -OT placed 2.5 foam cushion on top of wc cushion to increase seat height to improve sit to stand attempts, and provided min vc for increasing forward weight shift and attempts x10 reps; OT provided light pressure through the L quads to reduce clonus. -Scoot/pivot transfer wc<>toilet with min A to and from toilet, and min vc for  recommended wc set up in prep for transfer.  Mod vc during transfer to promote forward WS.  PATIENT EDUCATION: Education details: STS transfer technique  Person educated: Patient Education method: Explanation, vc, demo Education comprehension: verbalized understanding,  demonstrated understanding, further training needed  HOME EXERCISE PROGRAM: IR A/AAROM to promote shoulder flexibility for peri care/bathing  GOALS: Goals reviewed with patient? Yes  SHORT TERM GOALS: Target date: 12/25/23  Pt will perform bed bath with set up only. Baseline: Eval: Min-mod A for posterior washing; 09/28/23: Min A for posterior washing; 11/13/23: Able to manage bed bath but sister helps with thoroughness, per pt's preference; pt in agreement to trial bath sitting EOB or in wc before next OT session. Goal status: achieved/d/c  2.  Pt will utilize sliding board for wc<>drop arm commode transfer with SBA. Baseline: Eval: Currently using electric lift for transfer to [facility]; 09/28/23: Not yet completed at home but transfers to commode have been easier without sliding board d/t pt implementing a scoot pivot. Goal status: d/c (pt does not prefer sliding board for commode transfer)  3.  Pt will be indep to perform HEP for maintaining BUE strength for ADLs and functional transfers. Baseline: Eval: HEP not yet initiated; 09/28/23: Pt is working on core stability exercises from wc, cane stretches for bilat shoulder IR, and tricep extension via wc pushups; 11/13/23: indep Goal status: achieved  LONG TERM GOALS: Target date: 02/05/24  Pt will perform sink bath with set up A. (Revised on 11/13/23 from min A to set up) Baseline: Eval: Currently bed bathing d/t inability to stand at sink without electric lift (lift does not fit into pt's bathroom); 09/28/23: still completing bed bath as pt is not yet able to stand; 11/13/23: Not yet attempted; pt encouraged to attempt before next OT session now that bed bath goal has been met; 12/20/23:  Pt is now completing sponge bath with min A while seated EOB (caregiver assists with washing peri area d/t no bed rail to aid in R lateral lean) Goal status: ongoing   2.  Pt will perform wc<>tub bench transfer with min A. Baseline: Eval: Unable; 09/28/23: Performed in OT clinic with min A, but not yet in the home Goal status: d/c (pt prefers sink bath)  3.  Pt will perform squat pivot transfer wc<>BSC with SBA.  Baseline: Eval: Eval: Currently using electric lift for transfer to [facility]; 09/28/23: Not yet completed in clinic with [facility], and using lift for [facility] in the home still; 11/13/23: Still using Camie lift at home and in clinic transfers are all scoot rather than squat pivots d/t LE weakness; 12/20/23: Pt reports that she plans to use her Camie lift until she can ambulate to her [facility] d/t limited space for wc in her desired location for [facility] at home. Goal status: d/c  4. Pt will perform scoot transfer wc<>toilet using grab bars with supv to allow toileting in community setting.  Baseline: Eval: Pt limits community outings d/t requiring use of lift for [facility] transfers; 09/28/23: Pt has performed 2 successful scoot pivot  Transfers to toilet in OT clinic with min A, extra time.  Further trials with clothing management on toilet needed, but pt can lower and hike  pants while sititng edge of mat via lateral leaning and min A to maintain core stability; 11/13/23: Pt performs with CGA and extra time; 12/19/23: Pt   performs with close supv on most attempts, occasional CGA; efficiency is improving  Goal status: ongoing  5.  Pt will complete seated clothing management with min A to enable voiding in handicapped stall within community. Baseline: Recert 11/13/23: Pt can transfer to toilet with increased time and effort, but has not yet attempted clothing management or voiding  while seated on commode in community setting.  Pt can lower pants to knees while seated in wc, but requires mod A to hike over hips from seated  position; 12/20/23: Pt fully lowers panties and shorts while seated on commode with supv, can hike clothing to her her upper thighs, transfers back to wc, and then performs a tricep extension from wc level to allow caregiver to hike clothing remainder of the way for purposes of EC (min A for clothing management component) Goal status: achieved  6.  Pt will tolerate standing x1 min with close supv and BUE support to manage clothing in prep for toileting. Baseline: Eval: Currently standing in electric lift only, or within parallel bars without lift; 09/28/23: Not yet attempted; 11/13/23: Pt is able to perform static standing with heavy BUE support on parallel bars with PT; 12/19/22: Not attempted recently d/t new intermittent clonus in BLEs  Goal status: in progress  7.  Pt will increase bilat grip strength by (TBD) in order to securely grasp walker sufficiently for functional transfers and ambulation.  Baseline: Recert 11/13/23: TBD (PT note read following OT session, indicating pt with difficulty securing items in hand);  11/15/23: Grip strength: R: 70 lbs, L: 63 lbs; 12/25/23: R 70 lbs, L 65 lbs  Goal status: in progress  8.  Pt will increase bilat [facility] skills in order to manipulate small ADL supplies with reduced dropping as indicated by (TBD) improvement in 9 hole peg test. Baseline: Recert 11/13/23: 9 hole peg test TBD (PT note read following OT session, indicating pt with difficulty manipulating and securing items in hands); 11/15/23: Right: 25 sec, L 49 sec; 12/25/23: Right: 24 sec; Left: 3 trials: 54 sec, 1 min, 56 sec  Goal status: in progress  ASSESSMENT: CLINICAL IMPRESSION: Pt continues to demonstrate improvements in forward weight shifts in prep for sit to stand transfers, completing 10 successful forward weight shifts from elevated cushion at wc level with min vc.  Followed this activity with scoot pivot transfer wc<>toilet at end of session in order to mimic toilet transfers in community when pt  may be more fatigued.  Pt required increased physical assist and vc today when completing this transfer when she was already fatigued d/t no forward WS observed.  Pt typically demos less forward WS when fatigued.  OT provided positive reinforcement for pt to acknowledge that her transfer today was a progression, despite requiring min A, this was completed at the end of her session rather than all previous attempts at the beginning of her session, mimicking more real time scenarios in the community when pt is more likely to be fatigued.  Pt reported 6/10 when asked generally to rate her fatigue level; OT observed 8-9/10 more specifically on RPE scale.  Pt was encouraged to attempt scoot pivot transfer on her planned shopping trip this coming Monday with her sister helping for transfer assist.  Pt agreed.  Pt will continue to benefit from skilled OT to work towards above noted goals in OT poc, working to maximize indep with daily tasks while reducing burden of care on caregivers.   PERFORMANCE DEFICITS: in functional skills including ADLs, IADLs, strength, pain, flexibility, Gross motor control, mobility, balance, body mechanics, endurance, and decreased knowledge of use of DME, and psychosocial skills including coping strategies, environmental adaptation, habits, and routines and behaviors.   IMPAIRMENTS: are limiting patient from ADLs, IADLs, and social participation.   CO-MORBIDITIES: has co-morbidities such as neuropathy, back pain, obesity, MS, HTN that affects occupational  performance. Patient will benefit from skilled OT to address above impairments and improve overall function.  MODIFICATION OR ASSISTANCE TO COMPLETE EVALUATION: No modification of tasks or assist necessary to complete an evaluation.  OT OCCUPATIONAL PROFILE AND HISTORY: Detailed assessment: Review of records and additional review of physical, cognitive, psychosocial history related to current functional performance.  CLINICAL  DECISION MAKING: Moderate - several treatment options, min-mod task modification necessary  REHAB POTENTIAL: Good  EVALUATION COMPLEXITY: Moderate  PLAN:  OT FREQUENCY: 2x/week  OT DURATION: 12 weeks  PLANNED INTERVENTIONS: 97168 OT Re-evaluation, 97535 self care/ADL training, 02889 therapeutic exercise, 97530 therapeutic activity, 97112 neuromuscular re-education, 97140 manual therapy, 97116 gait training, 02989 moist heat, 97010 cryotherapy, balance training, functional mobility training, psychosocial skills training, energy conservation, coping strategies training, patient/family education, and DME and/or AE instructions  RECOMMENDED OTHER SERVICES: None at this time (Pt currently receiving PT services in this clinic)  CONSULTED AND AGREED WITH PLAN OF CARE: Patient  PLAN FOR NEXT SESSION: see above  Inocente Blazing, MS, OTR/L  Inocente MARLA Blazing, OT 01/10/2024, 11:45 AM

## 2024-01-11 ENCOUNTER — Other Ambulatory Visit: Payer: Self-pay | Admitting: Medical Genetics

## 2024-01-13 ENCOUNTER — Encounter: Payer: Self-pay | Admitting: Internal Medicine

## 2024-01-13 NOTE — Assessment & Plan Note (Signed)
Continue crestor.  Low cholesterol diet and exercise.  Follow lipid panel.  

## 2024-01-13 NOTE — Assessment & Plan Note (Signed)
 Conitnues to work with PT.

## 2024-01-13 NOTE — Assessment & Plan Note (Signed)
 Recently found to be elevated. Recent abdominal ultrasound - ok. Recheck liver panel today.

## 2024-01-13 NOTE — Assessment & Plan Note (Signed)
 Low-carb diet and exercise.  Follow met b and A1c.

## 2024-01-13 NOTE — Assessment & Plan Note (Signed)
 Currently taking micardis . Blood pressure as outlined. Follow pressures. Follow metabolic panel.

## 2024-01-13 NOTE — Assessment & Plan Note (Signed)
 Continue crestor

## 2024-01-13 NOTE — Assessment & Plan Note (Signed)
 Documented history of osteoporosis. Recent fracture. Discussed need for f/u bone density in the future.

## 2024-01-13 NOTE — Assessment & Plan Note (Signed)
 Documented history. Pulse rate 50-60. Follow. Has seen cardiology previously. Has had previous stress test and ECHO. Follow.

## 2024-01-13 NOTE — Assessment & Plan Note (Signed)
 Seeing Dr Vear. Continue working with PT. Continue mavenclad.

## 2024-01-15 ENCOUNTER — Ambulatory Visit: Payer: PPO

## 2024-01-15 ENCOUNTER — Other Ambulatory Visit
Admission: RE | Admit: 2024-01-15 | Discharge: 2024-01-15 | Disposition: A | Discharge status: Home / Self Care | Payer: Self-pay | Source: Ambulatory Visit | Attending: Medical Genetics | Admitting: Medical Genetics

## 2024-01-15 ENCOUNTER — Ambulatory Visit

## 2024-01-15 DIAGNOSIS — G35 Multiple sclerosis: Secondary | ICD-10-CM

## 2024-01-15 DIAGNOSIS — M6281 Muscle weakness (generalized): Secondary | ICD-10-CM

## 2024-01-15 DIAGNOSIS — R278 Other lack of coordination: Secondary | ICD-10-CM

## 2024-01-15 DIAGNOSIS — R262 Difficulty in walking, not elsewhere classified: Secondary | ICD-10-CM

## 2024-01-15 DIAGNOSIS — R2689 Other abnormalities of gait and mobility: Secondary | ICD-10-CM

## 2024-01-15 DIAGNOSIS — R269 Unspecified abnormalities of gait and mobility: Secondary | ICD-10-CM

## 2024-01-15 DIAGNOSIS — R2681 Unsteadiness on feet: Secondary | ICD-10-CM

## 2024-01-15 NOTE — Therapy (Signed)
 OUTPATIENT PHYSICAL THERAPY NEURO TREATMENT  Patient Name: Jaton Eilers Seidman MRN: [id number] DOB:1960/05/20, 63 y.o., female Today's Date: 01/15/2024  PCP: Lenon Na  REFERRING PROVIDER: Lenon Na  END OF SESSION:  PT End of Session - 01/15/24 1031     Visit Number 51    Number of Visits 70    Date for PT Re-Evaluation 03/20/24   corrected   Progress Note Due on Visit 50    PT Start Time 1017    PT Stop Time 1100    PT Time Calculation (min) 43 min    Equipment Utilized During Treatment Gait belt    Activity Tolerance Patient tolerated treatment well;No increased pain    Behavior During Therapy [facility] for tasks assessed/performed          Past Medical History:  Diagnosis Date   Abdominal pain, right upper quadrant    Back pain    Calculus of kidney 12/09/2013   Chronic back pain    unspecified   Chronic left shoulder pain 07/19/2015   Complication of anesthesia    Functional disorder of bladder    other   Galactorrhea 11/26/2014   Chronic    Hereditary and idiopathic neuropathy 08/19/2013   History of kidney stones    HPV test positive    Hypercholesteremia 08/19/2013   Hypertension    Incomplete bladder emptying    Microscopic hematuria    MS (multiple sclerosis) (HCC)    Muscle spasticity 05/21/2014   Nonspecific findings on examination of urine    other   Osteopenia    PONV (postoperative nausea and vomiting)    Status post laparoscopic supracervical hysterectomy 11/26/2014   Tobacco user 11/26/2014   Wrist fracture    Past Surgical History:  Procedure Laterality Date   bilateral tubal ligation  1996   BREAST CYST EXCISION Left 2002   CYST EXCISION Left 05/10/2022   Procedure: CYST REMOVAL;  Surgeon: Rodolph Romano, MD;  Location: [facility] ORS;  Service: General;  Laterality: Left;   FRACTURE SURGERY     KNEE SURGERY     right   LAPAROSCOPIC SUPRACERVICAL HYSTERECTOMY  08/05/2013   ORIF WRIST FRACTURE Left 01/17/2017    Procedure: OPEN REDUCTION INTERNAL FIXATION (ORIF) WRIST FRACTURE;  Surgeon: Leora Lynwood SAUNDERS, MD;  Location: [facility] OR[facility];  Service: Orthopedics;  Laterality: Left;   RADIOLOGY WITH ANESTHESIA N/A 03/18/2020   Procedure: MRI WITH ANESTHESIA CERVICAL SPINE AND BRAIN  WITH AND WITHOUT CONTRAST;  Surgeon: Radiologist, Medication, MD;  Location: [facility] OR;  Service: Radiology;  Laterality: N/A;   TUBAL LIGATION Bilateral    VAGINAL HYSTERECTOMY  03/2006   Patient Active Problem List   Diagnosis Date Noted   Hyperglycemia 01/09/2024   Abnormal LFTs (liver function tests) 09/17/2023   Sinus tachycardia 07/07/2023   Aortic atherosclerosis (HCC) 01/18/2023   Prediabetes 01/18/2023   Hypokalemia 07/29/2022   Weakness of both lower extremities 07/29/2022   Abscess of left groin 11/15/2021   Abnormal LFTs 11/15/2021   Acute respiratory disease due to COVID-19 virus 11/21/2020   Weakness    Hypoalbuminemia due to protein-calorie malnutrition [facility])    Neurogenic bowel    Neurogenic bladder    Labile blood pressure    Neuropathic pain    Multiple sclerosis, relapsing-remitting (HCC) 07/09/2019   Abscess of female pelvis    SVT (supraventricular tachycardia) (HCC)    Radial styloid tenosynovitis 03/12/2018   Wheelchair dependence 02/27/2018   Localized osteoporosis with current pathological fracture with routine healing 01/19/2017  Wrist fracture 01/16/2017   Sprain of ankle 03/23/2016   Closed fracture of lateral malleolus 03/16/2016   Health care maintenance 01/24/2016   Blood pressure elevated without history of HTN 10/25/2015   Essential hypertension 10/25/2015   Multiple sclerosis (HCC) 10/02/2015   Chronic left shoulder pain 07/19/2015   Multiple sclerosis exacerbation (HCC) 07/14/2015   MS (multiple sclerosis) (HCC) 11/26/2014   Increased body mass index 11/26/2014   HPV test positive 11/26/2014   Status post laparoscopic supracervical hysterectomy 11/26/2014   Galactorrhea 11/26/2014    Back ache 05/21/2014   Adiposity 05/21/2014   Disordered sleep 05/21/2014   Muscle spasticity 05/21/2014   Spasticity 05/21/2014   Calculus of kidney 12/09/2013   Renal colic 12/09/2013   Hypercholesteremia 08/19/2013   Hereditary and idiopathic neuropathy 08/19/2013   Hypercholesterolemia without hypertriglyceridemia 08/19/2013   Bladder infection, chronic 07/25/2012   Disorder of bladder function 07/25/2012   Incomplete bladder emptying 07/25/2012   Microscopic hematuria 07/25/2012   Right upper quadrant pain 07/25/2012    ONSET DATE: 1995  REFERRING DIAG: MS  THERAPY DIAG:  Muscle weakness (generalized)  Other lack of coordination  Multiple sclerosis exacerbation (HCC)  Difficulty in walking, not elsewhere classified  Unsteadiness on feet  Abnormality of gait and mobility  Other abnormalities of gait and mobility  Rationale for Evaluation and Treatment: Rehabilitation  SUBJECTIVE:                                                                                                                                                                                             SUBJECTIVE STATEMENT:  Pt reports she just found out that she hypothyroidism and that may have been contributing to some of the symptoms that she has as well, such as muscular fatigue and being cold a lot of times.    Pt accompanied by: self  PERTINENT HISTORY:  Patient is returning to PT s/p hospitalization.  s/p ORIF of R tibia shaft fracture 09/23/2022. Patient has weakness in BLE with RLE>LLE. She drives with hand controls. Patient has been diagnosed with MS in 1995. PMH includes: back pain, CBP, chronic L shoulder pain, galactorrhea, neuropathy, HPV, hypercholestermeia, HTN, MS, osteopenia, PONV, wrist fracture. Additional order for other closed fracture of proximal end of R tibia with routine healing. Still has to use [employer].   PAIN:  Are you having pain? Occasional pain in RLE; primarily in  knee  PRECAUTIONS: Fall  RED FLAGS: None   WEIGHT BEARING RESTRICTIONS: No  FALLS: Has patient fallen in last 6 months? No  LIVING ENVIRONMENT: Lives with: lives with their family Lives in: House/apartment Stairs: ramp  Has following equipment at home: Vannie - 2 wheeled, Wheelchair (manual), shower chair, and Grab bars  PLOF: Independent with household mobility with device  PATIENT GOALS: to get her independence back. To be able to get into car, toilet, and get dressed independently  OBJECTIVE:  Note: Objective measures were completed at Evaluation unless otherwise noted.  DIAGNOSTIC FINDINGS: MRI of the brain 03/18/2020 showed multiple T2/FLAIR hyperintense foci in the periventricular, juxtacortical and deep white matter.  There were no infratentorial lesions noted.  None of the foci enhanced.   Due to severe claustrophobia, the study was done with conscious sedation in the hospital   COGNITION: Overall cognitive status: Within functional limits for tasks assessed   SENSATION: Lack of sensation in feet, loss in bilateral lateral aspect of knee  COORDINATION: Does not have the strength for functional LE heel slide test   MUSCLE TONE: BLE mild tone    POSTURE: rounded shoulders, forward head, anterior pelvic tilt, and weight shift left   LOWER EXTREMITY MMT:    MMT Right Eval Left Eval  Hip flexion 0.9 1.5  Hip extension    Hip abduction 1.8 2.6  Hip adduction 3.1 1.9  Hip internal rotation    Hip external rotation    Knee flexion 0.9 1.5  Knee extension 0.8 1.2  Ankle dorsiflexion    Ankle plantarflexion    Ankle inversion    Ankle eversion    (Blank rows = not tested)  BED MOBILITY:  Assess in future session due to limited time  TRANSFERS: Assistive device utilized: Bariatric RW with sit to stands, slide board to table  Sit to stand: unable to reach full stand Stand to sit: unable to reach full stand  Chair to chair: slide board with CGA    GAIT: Unable to ambulate at this time.   FUNCTIONAL TESTS:  Sit to stand: tricep press with BUE; unable to bring arm to walker.    Function In Sitting Test (FIST)  (1/2 femur on surface; hips/knees flexed to 90deg)   - indicate bed or mat table / step stool if used  SCORING KEY: 4 = Independent (completes task independently & successfully) 3 = Verbal Cues/Increased Time (completes task independently & successfully and only needs more time/cues) 2 = Upper Extremity Support (must use UE for support or assistance to complete successfully) 1 = Needs Assistance (unable to complete w/o physical assist; DOCUMENT LEVEL: min, mod, max) 0 = Dependent (requires complete physical assist; unable to complete successfully even w/ physical assist)  Randomly Administer Once Throughout Exam  4 - Anterior Nudge (superior sternum)  4 - Posterior Nudge (between scapular spines)  4 - Lateral Nudge (to dominant side at acromion)     4 - Static sitting (30 seconds)  4 - Sitting, shake 'no' (left and right)  4 - Sitting, eyes closed (30 seconds)   0 - Sitting, lift foot (dominant side, lift foot 1 inch twice)    2 - Pick up object from behind (object at midline, hands breadth posterior)  3 - Forward reach (use dominant arm, must complete full motion) 2 - Lateral reach (use dominant arm, clear opposite ischial tuberosity) 2 - Pick up object from floor (from between feet)   2 - Posterior scooting (move backwards 2 inches)  2 - Anterior scooting (move forward 2 inches)  2 - Lateral scooting (move to dominant side 2 inches)    TOTAL = 39/56  Notes/comments: slide board tranfer to/from table   MCD > 5  points MCID for IP REHAB > 6 points  Function In Sitting Test (FIST) 08/14/23 (1/2 femur on surface; hips/knees flexed to 90deg)   - indicate bed or mat table / step stool if used  SCORING KEY: 4 = Independent (completes task independently & successfully) 3 = Verbal Cues/Increased Time (completes  task independently & successfully and only needs more time/cues) 2 = Upper Extremity Support (must use UE for support or assistance to complete successfully) 1 = Needs Assistance (unable to complete w/o physical assist; DOCUMENT LEVEL: min, mod, max) 0 = Dependent (requires complete physical assist; unable to complete successfully even w/ physical assist)  Randomly Administer Once Throughout Exam  4 - Anterior Nudge (superior sternum)  4 - Posterior Nudge (between scapular spines)  4 - Lateral Nudge (to dominant side at acromion)     4 - Static sitting (30 seconds)  4 - Sitting, shake 'no' (left and right)  4 - Sitting, eyes closed (30 seconds)   0 - Sitting, lift foot (dominant side, lift foot 1 inch twice)    4 - Pick up object from behind (object at midline, hands breadth posterior)  4 - Forward reach (use dominant arm, must complete full motion) 4 - Lateral reach (use dominant arm, clear opposite ischial tuberosity)  - Pick up object from floor (from between feet)   3 - Posterior scooting (move backwards 2 inches)  3 - Anterior scooting (move forward 2 inches)  3 - Lateral scooting (move to dominant side 2 inches)    TOTAL = 47/56  MCD > 5 points MCID for IP REHAB > 6 points                                                                                                                              TREATMENT DATE: 01/15/24  TherAct: To improve functional movements patterns for everyday tasks  Standing frame (using the Strap stand): Patient able to push up from chair to minimally raise her bottom to allow author to position gluteal padding underneath her.   Dependent with foot straps and locking of frame. Patient initially performed standing pumping up using her RUE and therapist assisted on the second attempt due to strap along the gluteal region being displaced and not allowing for pt to be as upright as necessary.  Pt was then able to stand in near complete erect standing  and performed dynamic activities:  Standing glute squeezes/activation, x10  Standing weighted ball (2000kg) into forward flexion to 90 deg, 2x10  Standing weighted ball (2000kg) into [place] twists, 2x10  Standing weighted ball (2000kg) into forward flexion above head,  2x10  STS in // bars, and modA from therapist, x4 total attempts for standing      PATIENT EDUCATION: Education details: Pt educated throughout session about proper posture and technique with exercises. Improved exercise technique, movement at target joints, use of target muscles after min to mod verbal, visual, tactile cues.  Person educated:  Patient Education method: Explanation, Demonstration, Tactile cues, and Verbal cues Education comprehension: verbalized understanding, returned demonstration, verbal cues required, tactile cues required, and needs further education  HOME EXERCISE PROGRAM: Stand 3x/day in stander   GOALS: Goals reviewed with patient? Yes  SHORT TERM GOALS: Target date: 08/08/2023  Patient will be independent in home exercise program to improve strength/mobility for better functional independence with ADLs.  Baseline: 4/8: compliance Goal status: MET    LONG TERM GOALS: Target date: 03/20/2024  Patient will tolerate five consecutive stands with UE support from wheelchair to standing to improve functional mobility with additional cushion and RW.  Baseline: unable to perform 4/8:unable to perform full stand 5/20: perform next session 5/29: two full stands with CGA and cushion on her seat 7/31: able to stand 3 times from [facility]. With no addition cushion  in parallel bars. 12/27/2023= Will retest next session- concentrated on just standing on past 2 visits Goal status: Ongoing   2.  Patient will ambulate 10 ft with bRW with wheelchair follow.  Baseline:  unable to ambulate 4/8: unable to ambulate 5/20: able to ambulate length of // bars in previous session 5/27: ambulate 2.5 lengths of // bars  with two seated rest breaks with min A for weight shift and wheelchair follow  7/31: able to ambulate in parallel bars 3 ft with min assist and +2 follow in WC> difficulty advancing the LLE on this day. 12/27/2023- Patient unable to take a step forward today but was abel to take a step on previous visit- limited by clonus like trembling that has progressed over past month. Goal status: Ongoing   3.  Patient will improve FIST score >6 points to demonstrate improved stability and ability to perform ADLs.  Baseline:  3/6: 39/56 4/8: 47/ 56 5/20: 48/56 7/31:46/56 Goal status: MET  4.  Patient will perform toileting with mod I at home for improved independence.  Baseline: 3/6: requires assistance 4/8: requires dependence on machine 5/20: requires assistance with use of wipe buddy.  7/31: continues to require use of sara steady and wipe buddy for safety and hygiene. 12/27/2023- Patient reports that since she is unable to take steps that she continues to require use of sara steady and wipe buddy for safe personal hygiene. Goal status: ongoing   5. Patient will perform > 5 min of static or dynamic standing with BUE and Min/mod assist  for improved LE strength and pre-gait function to assist with toileting and overall standing ability for improve functional independence.  Baseline: 12/27/2023-Patient was able to stand for just over 3 min today. 01/10/24: Pt able to stand 1 min 38 sec Goal status: NOT PROGRESSING    ASSESSMENT:  CLINICAL IMPRESSION:  Pt was able to tolerate standing for prolonged period of time in both the standing frame while doing UE tasks, as well as standing in the // bars.  Pt seems to operate better with the use of the old braces as evident during the treatment session.  Pt unable to take steps today due to the weakness she felt after performing standing in the frame for so long.  Pt would like to continue to perform ambulation attempts at subsequent visits.   Pt will continue to  benefit from skilled therapy to address remaining deficits in order to improve overall QoL and return to PLOF.         OBJECTIVE IMPAIRMENTS: Abnormal gait, cardiopulmonary status limiting activity, decreased activity tolerance, decreased balance, decreased coordination, decreased endurance, decreased mobility, difficulty walking,  decreased ROM, decreased strength, hypomobility, increased fascial restrictions, impaired perceived functional ability, impaired flexibility, impaired sensation, improper body mechanics, and postural dysfunction.   ACTIVITY LIMITATIONS: carrying, lifting, bending, sitting, standing, squatting, sleeping, stairs, transfers, bed mobility, continence, bathing, toileting, dressing, self feeding, reach over head, hygiene/grooming, locomotion level, and caring for others  PARTICIPATION LIMITATIONS: meal prep, cleaning, laundry, interpersonal relationship, driving, shopping, community activity, and church  PERSONAL FACTORS: Age, Past/current experiences, Time since onset of injury/illness/exacerbation, Transportation, and 3+ comorbidities: ack pain, CBP, chronic L shoulder pain, galactorrhea, neuropathy, HPV, hypercholestermeia, HTN, MS, osteopenia, PONV, wrist fracture are also affecting patient's functional outcome.   REHAB POTENTIAL: Good  CLINICAL DECISION MAKING: Evolving/moderate complexity  EVALUATION COMPLEXITY: Moderate  PLAN:  PT FREQUENCY: 2x/week  PT DURATION: 12 weeks  PLANNED INTERVENTIONS: 97164- PT Re-evaluation, 97110-Therapeutic exercises, 97530- Therapeutic activity, 97112- Neuromuscular re-education, 97535- Self Care, [id number]- Manual therapy, [phone]- Gait training, [phone]- Orthotic Fit/training, [phone]- Canalith repositioning, [id number]- Aquatic Therapy, [phone]- Electrical stimulation (unattended), [phone]- Electrical stimulation (manual), [id number]- Ultrasound, [id number]- Traction (mechanical), Patient/Family education, Balance training, Stair training, Taping, Dry  Needling, Joint mobilization, Joint manipulation, Spinal manipulation, Spinal mobilization, Scar mobilization, Compression bandaging, Vestibular training, Visual/preceptual remediation/compensation, Cognitive remediation, DME instructions, Cryotherapy, Moist heat, and Biofeedback  PLAN FOR NEXT SESSION:  Strap stand frame with more dynamic activities Sit to stands; try supported walking, continue dynamic core stabilization training and seated reaching tasks to improve weight shifting.    Fonda Simpers, PT, DPT Physical Therapist - [facility]  01/15/24, 1:52 PM

## 2024-01-17 ENCOUNTER — Ambulatory Visit: Payer: PPO

## 2024-01-17 ENCOUNTER — Ambulatory Visit

## 2024-01-17 DIAGNOSIS — R2681 Unsteadiness on feet: Secondary | ICD-10-CM

## 2024-01-17 DIAGNOSIS — M6281 Muscle weakness (generalized): Secondary | ICD-10-CM | POA: Diagnosis not present

## 2024-01-17 DIAGNOSIS — G35 Multiple sclerosis: Secondary | ICD-10-CM

## 2024-01-17 DIAGNOSIS — R262 Difficulty in walking, not elsewhere classified: Secondary | ICD-10-CM

## 2024-01-17 DIAGNOSIS — R278 Other lack of coordination: Secondary | ICD-10-CM

## 2024-01-17 DIAGNOSIS — R269 Unspecified abnormalities of gait and mobility: Secondary | ICD-10-CM

## 2024-01-17 DIAGNOSIS — R2689 Other abnormalities of gait and mobility: Secondary | ICD-10-CM

## 2024-01-17 NOTE — Therapy (Signed)
 OUTPATIENT PHYSICAL THERAPY NEURO TREATMENT  Patient Name: Lisa Williams MRN: [id number] DOB:[birth year], 63 y.o., female Today's Date: 01/17/2024  PCP: Lenon Na  REFERRING PROVIDER: Lenon Na  END OF SESSION:  PT End of Session - 01/17/24 1105     Visit Number 52    Number of Visits 70    Date for PT Re-Evaluation 03/20/24   corrected   Progress Note Due on Visit 50    PT Start Time 1016    PT Stop Time 1100    PT Time Calculation (min) 44 min    Equipment Utilized During Treatment Gait belt    Activity Tolerance Patient tolerated treatment well;No increased pain    Behavior During Therapy [facility] for tasks assessed/performed           Past Medical History:  Diagnosis Date   Abdominal pain, right upper quadrant    Back pain    Calculus of kidney 12/09/2013   Chronic back pain    unspecified   Chronic left shoulder pain 07/19/2015   Complication of anesthesia    Functional disorder of bladder    other   Galactorrhea 11/26/2014   Chronic    Hereditary and idiopathic neuropathy 08/19/2013   History of kidney stones    HPV test positive    Hypercholesteremia 08/19/2013   Hypertension    Incomplete bladder emptying    Microscopic hematuria    MS (multiple sclerosis) (HCC)    Muscle spasticity 05/21/2014   Nonspecific findings on examination of urine    other   Osteopenia    PONV (postoperative nausea and vomiting)    Status post laparoscopic supracervical hysterectomy 11/26/2014   Tobacco user 11/26/2014   Wrist fracture    Past Surgical History:  Procedure Laterality Date   bilateral tubal ligation  1996   BREAST CYST EXCISION Left 2002   CYST EXCISION Left 05/10/2022   Procedure: CYST REMOVAL;  Surgeon: Rodolph Romano, MD;  Location: [facility] ORS;  Service: General;  Laterality: Left;   FRACTURE SURGERY     KNEE SURGERY     right   LAPAROSCOPIC SUPRACERVICAL HYSTERECTOMY  08/05/2013   ORIF WRIST FRACTURE Left 01/17/2017    Procedure: OPEN REDUCTION INTERNAL FIXATION (ORIF) WRIST FRACTURE;  Surgeon: Leora Lynwood SAUNDERS, MD;  Location: [facility] ORS;  Service: Orthopedics;  Laterality: Left;   RADIOLOGY WITH ANESTHESIA N/A 03/18/2020   Procedure: MRI WITH ANESTHESIA CERVICAL SPINE AND BRAIN  WITH AND WITHOUT CONTRAST;  Surgeon: Radiologist, Medication, MD;  Location: [facility] OR;  Service: Radiology;  Laterality: N/A;   TUBAL LIGATION Bilateral    VAGINAL HYSTERECTOMY  03/2006   Patient Active Problem List   Diagnosis Date Noted   Hyperglycemia 01/09/2024   Abnormal LFTs (liver function tests) 09/17/2023   Sinus tachycardia 07/07/2023   Aortic atherosclerosis (HCC) 01/18/2023   Prediabetes 01/18/2023   Hypokalemia 07/29/2022   Weakness of both lower extremities 07/29/2022   Abscess of left groin 11/15/2021   Abnormal LFTs 11/15/2021   Acute respiratory disease due to COVID-19 virus 11/21/2020   Weakness    Hypoalbuminemia due to protein-calorie malnutrition [facility])    Neurogenic bowel    Neurogenic bladder    Labile blood pressure    Neuropathic pain    Multiple sclerosis, relapsing-remitting (HCC) 07/09/2019   Abscess of female pelvis    SVT (supraventricular tachycardia) (HCC)    Radial styloid tenosynovitis 03/12/2018   Wheelchair dependence 02/27/2018   Localized osteoporosis with current pathological fracture with routine healing 01/19/2017  Wrist fracture 01/16/2017   Sprain of ankle 03/23/2016   Closed fracture of lateral malleolus 03/16/2016   Health care maintenance 01/24/2016   Blood pressure elevated without history of HTN 10/25/2015   Essential hypertension 10/25/2015   Multiple sclerosis (HCC) 10/02/2015   Chronic left shoulder pain 07/19/2015   Multiple sclerosis exacerbation (HCC) 07/14/2015   MS (multiple sclerosis) (HCC) 11/26/2014   Increased body mass index 11/26/2014   HPV test positive 11/26/2014   Status post laparoscopic supracervical hysterectomy 11/26/2014   Galactorrhea 11/26/2014    Back ache 05/21/2014   Adiposity 05/21/2014   Disordered sleep 05/21/2014   Muscle spasticity 05/21/2014   Spasticity 05/21/2014   Calculus of kidney 12/09/2013   Renal colic 12/09/2013   Hypercholesteremia 08/19/2013   Hereditary and idiopathic neuropathy 08/19/2013   Hypercholesterolemia without hypertriglyceridemia 08/19/2013   Bladder infection, chronic 07/25/2012   Disorder of bladder function 07/25/2012   Incomplete bladder emptying 07/25/2012   Microscopic hematuria 07/25/2012   Right upper quadrant pain 07/25/2012    ONSET DATE: 1995  REFERRING DIAG: MS  THERAPY DIAG:  Muscle weakness (generalized)  Other lack of coordination  Multiple sclerosis exacerbation (HCC)  Difficulty in walking, not elsewhere classified  Unsteadiness on feet  Abnormality of gait and mobility  Other abnormalities of gait and mobility  Rationale for Evaluation and Treatment: Rehabilitation  SUBJECTIVE:                                                                                                                                                                                             SUBJECTIVE STATEMENT:  Pt reports she is feeling good today and that she's going to walk today.    Pt accompanied by: self  PERTINENT HISTORY:  Patient is returning to PT s/p hospitalization.  s/p ORIF of R tibia shaft fracture 09/23/2022. Patient has weakness in BLE with RLE>LLE. She drives with hand controls. Patient has been diagnosed with MS in 1995. PMH includes: back pain, CBP, chronic L shoulder pain, galactorrhea, neuropathy, HPV, hypercholestermeia, HTN, MS, osteopenia, PONV, wrist fracture. Additional order for other closed fracture of proximal end of R tibia with routine healing. Still has to use [employer].   PAIN:  Are you having pain? Occasional pain in RLE; primarily in knee  PRECAUTIONS: Fall  RED FLAGS: None   WEIGHT BEARING RESTRICTIONS: No  FALLS: Has patient fallen in last 6  months? No  LIVING ENVIRONMENT: Lives with: lives with their family Lives in: House/apartment Stairs: ramp Has following equipment at home: Vannie - 2 wheeled, Wheelchair (manual), shower chair, and Grab bars  PLOF: Independent with household mobility  with device  PATIENT GOALS: to get her independence back. To be able to get into car, toilet, and get dressed independently  OBJECTIVE:  Note: Objective measures were completed at Evaluation unless otherwise noted.  DIAGNOSTIC FINDINGS: MRI of the brain 03/18/2020 showed multiple T2/FLAIR hyperintense foci in the periventricular, juxtacortical and deep white matter.  There were no infratentorial lesions noted.  None of the foci enhanced.   Due to severe claustrophobia, the study was done with conscious sedation in the hospital   COGNITION: Overall cognitive status: Within functional limits for tasks assessed   SENSATION: Lack of sensation in feet, loss in bilateral lateral aspect of knee  COORDINATION: Does not have the strength for functional LE heel slide test   MUSCLE TONE: BLE mild tone    POSTURE: rounded shoulders, forward head, anterior pelvic tilt, and weight shift left   LOWER EXTREMITY MMT:    MMT Right Eval Left Eval  Hip flexion 0.9 1.5  Hip extension    Hip abduction 1.8 2.6  Hip adduction 3.1 1.9  Hip internal rotation    Hip external rotation    Knee flexion 0.9 1.5  Knee extension 0.8 1.2  Ankle dorsiflexion    Ankle plantarflexion    Ankle inversion    Ankle eversion    (Blank rows = not tested)  BED MOBILITY:  Assess in future session due to limited time  TRANSFERS: Assistive device utilized: Bariatric RW with sit to stands, slide board to table  Sit to stand: unable to reach full stand Stand to sit: unable to reach full stand  Chair to chair: slide board with CGA   GAIT: Unable to ambulate at this time.   FUNCTIONAL TESTS:  Sit to stand: tricep press with BUE; unable to bring arm to  walker.    Function In Sitting Test (FIST)  (1/2 femur on surface; hips/knees flexed to 90deg)   - indicate bed or mat table / step stool if used  SCORING KEY: 4 = Independent (completes task independently & successfully) 3 = Verbal Cues/Increased Time (completes task independently & successfully and only needs more time/cues) 2 = Upper Extremity Support (must use UE for support or assistance to complete successfully) 1 = Needs Assistance (unable to complete w/o physical assist; DOCUMENT LEVEL: min, mod, max) 0 = Dependent (requires complete physical assist; unable to complete successfully even w/ physical assist)  Randomly Administer Once Throughout Exam  4 - Anterior Nudge (superior sternum)  4 - Posterior Nudge (between scapular spines)  4 - Lateral Nudge (to dominant side at acromion)     4 - Static sitting (30 seconds)  4 - Sitting, shake 'no' (left and right)  4 - Sitting, eyes closed (30 seconds)   0 - Sitting, lift foot (dominant side, lift foot 1 inch twice)    2 - Pick up object from behind (object at midline, hands breadth posterior)  3 - Forward reach (use dominant arm, must complete full motion) 2 - Lateral reach (use dominant arm, clear opposite ischial tuberosity) 2 - Pick up object from floor (from between feet)   2 - Posterior scooting (move backwards 2 inches)  2 - Anterior scooting (move forward 2 inches)  2 - Lateral scooting (move to dominant side 2 inches)    TOTAL = 39/56  Notes/comments: slide board tranfer to/from table   MCD > 5 points MCID for IP REHAB > 6 points  Function In Sitting Test (FIST) 08/14/23 (1/2 femur on surface; hips/knees flexed to  90deg)   - indicate bed or mat table / step stool if used  SCORING KEY: 4 = Independent (completes task independently & successfully) 3 = Verbal Cues/Increased Time (completes task independently & successfully and only needs more time/cues) 2 = Upper Extremity Support (must use UE for support or  assistance to complete successfully) 1 = Needs Assistance (unable to complete w/o physical assist; DOCUMENT LEVEL: min, mod, max) 0 = Dependent (requires complete physical assist; unable to complete successfully even w/ physical assist)  Randomly Administer Once Throughout Exam  4 - Anterior Nudge (superior sternum)  4 - Posterior Nudge (between scapular spines)  4 - Lateral Nudge (to dominant side at acromion)     4 - Static sitting (30 seconds)  4 - Sitting, shake 'no' (left and right)  4 - Sitting, eyes closed (30 seconds)   0 - Sitting, lift foot (dominant side, lift foot 1 inch twice)    4 - Pick up object from behind (object at midline, hands breadth posterior)  4 - Forward reach (use dominant arm, must complete full motion) 4 - Lateral reach (use dominant arm, clear opposite ischial tuberosity)  - Pick up object from floor (from between feet)   3 - Posterior scooting (move backwards 2 inches)  3 - Anterior scooting (move forward 2 inches)  3 - Lateral scooting (move to dominant side 2 inches)    TOTAL = 47/56  MCD > 5 points MCID for IP REHAB > 6 points                                                                                                                              TREATMENT DATE: 01/17/24  Gait Training:  Pt and therapist agreed to start with the use of the // bars to see if pt could walk before becoming fatigued from the standing frame.   Pt able to take several steps (3-4) at a time before resting.  Pt ultimately able to ambulate the length of the // bars with modA to come upright and consistent CGA when taking a few steps, and then assisting with lowering to wheelchair.  Wheelchair follow utilized as well.   TherAct: To improve functional movements patterns for everyday tasks  Standing frame (using the Strap stand): Patient able to push up from chair to minimally raise her bottom to allow author to position gluteal padding underneath her.   Dependent  with foot straps and locking of frame.  Standing glute squeezes/activation, x10  Standing reaches outside of BOS, to promote core stability, 2x10   Pt noted to have the R knee adducted when in the standing frame and caused some soreness due to the location of where it was pressing on the knee pad.  Pt stated she was ok, but will need to re-assess at the next visit to make sure knee is proper placed in the knee pad and does not move into IR/adduction.  PATIENT EDUCATION: Education details: Pt educated throughout session about proper posture and technique with exercises. Improved exercise technique, movement at target joints, use of target muscles after min to mod verbal, visual, tactile cues.  Person educated: Patient Education method: Explanation, Demonstration, Tactile cues, and Verbal cues Education comprehension: verbalized understanding, returned demonstration, verbal cues required, tactile cues required, and needs further education  HOME EXERCISE PROGRAM: Stand 3x/day in stander   GOALS: Goals reviewed with patient? Yes  SHORT TERM GOALS: Target date: 08/08/2023  Patient will be independent in home exercise program to improve strength/mobility for better functional independence with ADLs.  Baseline: 4/8: compliance Goal status: MET    LONG TERM GOALS: Target date: 03/20/2024  Patient will tolerate five consecutive stands with UE support from wheelchair to standing to improve functional mobility with additional cushion and RW.  Baseline: unable to perform 4/8:unable to perform full stand 5/20: perform next session 5/29: two full stands with CGA and cushion on her seat 7/31: able to stand 3 times from [facility]. With no addition cushion  in parallel bars. 12/27/2023= Will retest next session- concentrated on just standing on past 2 visits Goal status: Ongoing   2.  Patient will ambulate 10 ft with bRW with wheelchair follow.  Baseline:  unable to ambulate 4/8: unable to ambulate  5/20: able to ambulate length of // bars in previous session 5/27: ambulate 2.5 lengths of // bars with two seated rest breaks with min A for weight shift and wheelchair follow  7/31: able to ambulate in parallel bars 3 ft with min assist and +2 follow in WC> difficulty advancing the LLE on this day. 12/27/2023- Patient unable to take a step forward today but was abel to take a step on previous visit- limited by clonus like trembling that has progressed over past month. Goal status: Ongoing   3.  Patient will improve FIST score >6 points to demonstrate improved stability and ability to perform ADLs.  Baseline:  3/6: 39/56 4/8: 47/ 56 5/20: 48/56 7/31:46/56 Goal status: MET  4.  Patient will perform toileting with mod I at home for improved independence.  Baseline: 3/6: requires assistance 4/8: requires dependence on machine 5/20: requires assistance with use of wipe buddy.  7/31: continues to require use of sara steady and wipe buddy for safety and hygiene. 12/27/2023- Patient reports that since she is unable to take steps that she continues to require use of sara steady and wipe buddy for safe personal hygiene. Goal status: ongoing   5. Patient will perform > 5 min of static or dynamic standing with BUE and Min/mod assist  for improved LE strength and pre-gait function to assist with toileting and overall standing ability for improve functional independence.  Baseline: 12/27/2023-Patient was able to stand for just over 3 min today. 01/10/24: Pt able to stand 1 min 38 sec Goal status: NOT PROGRESSING    ASSESSMENT:  CLINICAL IMPRESSION:  Pt responded well and was able to take steps in the // bars for the first time in several visits.  Pt ultimately fatigued at the conclusion of the session, however put forth great effort throughout the session.  Pt will continue to benefit from the use of the standing frame and attempts at ambulation in the subsequent visits.   Pt will continue to benefit from  skilled therapy to address remaining deficits in order to improve overall QoL and return to PLOF.        OBJECTIVE IMPAIRMENTS: Abnormal gait, cardiopulmonary status limiting activity,  decreased activity tolerance, decreased balance, decreased coordination, decreased endurance, decreased mobility, difficulty walking, decreased ROM, decreased strength, hypomobility, increased fascial restrictions, impaired perceived functional ability, impaired flexibility, impaired sensation, improper body mechanics, and postural dysfunction.   ACTIVITY LIMITATIONS: carrying, lifting, bending, sitting, standing, squatting, sleeping, stairs, transfers, bed mobility, continence, bathing, toileting, dressing, self feeding, reach over head, hygiene/grooming, locomotion level, and caring for others  PARTICIPATION LIMITATIONS: meal prep, cleaning, laundry, interpersonal relationship, driving, shopping, community activity, and church  PERSONAL FACTORS: Age, Past/current experiences, Time since onset of injury/illness/exacerbation, Transportation, and 3+ comorbidities: ack pain, CBP, chronic L shoulder pain, galactorrhea, neuropathy, HPV, hypercholestermeia, HTN, MS, osteopenia, PONV, wrist fracture are also affecting patient's functional outcome.   REHAB POTENTIAL: Good  CLINICAL DECISION MAKING: Evolving/moderate complexity  EVALUATION COMPLEXITY: Moderate  PLAN:  PT FREQUENCY: 2x/week  PT DURATION: 12 weeks  PLANNED INTERVENTIONS: 97164- PT Re-evaluation, 97110-Therapeutic exercises, 97530- Therapeutic activity, 97112- Neuromuscular re-education, 97535- Self Care, [id number]- Manual therapy, [phone]- Gait training, [phone]- Orthotic Fit/training, [phone]- Canalith repositioning, [id number]- Aquatic Therapy, [phone]- Electrical stimulation (unattended), [phone]- Electrical stimulation (manual), [id number]- Ultrasound, [id number]- Traction (mechanical), Patient/Family education, Balance training, Stair training, Taping, Dry Needling, Joint  mobilization, Joint manipulation, Spinal manipulation, Spinal mobilization, Scar mobilization, Compression bandaging, Vestibular training, Visual/preceptual remediation/compensation, Cognitive remediation, DME instructions, Cryotherapy, Moist heat, and Biofeedback  PLAN FOR NEXT SESSION:  Strap stand frame with more dynamic activities Sit to stands; try supported walking, continue dynamic core stabilization training and seated reaching tasks to improve weight shifting.    Fonda Simpers, PT, DPT Physical Therapist - [facility]  01/17/24, 3:15 PM

## 2024-01-17 NOTE — Therapy (Signed)
 OUTPATIENT OCCUPATIONAL THERAPY NEURO TREATMENT NOTE  Patient Name: Lisa Williams MRN: [id number] DOB:11-01-1960, 63 y.o., female Today's Date: 01/17/2024  PCP: Dr. Layman Piety   Neurologist Dr. Suanne at [facility] REFERRING PROVIDER: Dr. Layman Piety    END OF SESSION:  OT End of Session - 01/17/24 1045     Visit Number 37    Number of Visits 44    Date for OT Re-Evaluation 02/05/24    Authorization Time Period Reporting period beginning 12/20/23    Progress Note Due on Visit 40    OT Start Time 1100    OT Stop Time 1145    OT Time Calculation (min) 45 min    Equipment Utilized During Treatment manual wc    Activity Tolerance Patient tolerated treatment well    Behavior During Therapy [facility] for tasks assessed/performed         Past Medical History:  Diagnosis Date   Abdominal pain, right upper quadrant    Back pain    Calculus of kidney 12/09/2013   Chronic back pain    unspecified   Chronic left shoulder pain 07/19/2015   Complication of anesthesia    Functional disorder of bladder    other   Galactorrhea 11/26/2014   Chronic    Hereditary and idiopathic neuropathy 08/19/2013   History of kidney stones    HPV test positive    Hypercholesteremia 08/19/2013   Hypertension    Incomplete bladder emptying    Microscopic hematuria    MS (multiple sclerosis) (HCC)    Muscle spasticity 05/21/2014   Nonspecific findings on examination of urine    other   Osteopenia    PONV (postoperative nausea and vomiting)    Status post laparoscopic supracervical hysterectomy 11/26/2014   Tobacco user 11/26/2014   Wrist fracture    Past Surgical History:  Procedure Laterality Date   bilateral tubal ligation  1996   BREAST CYST EXCISION Left 2002   CYST EXCISION Left 05/10/2022   Procedure: CYST REMOVAL;  Surgeon: Rodolph Romano, MD;  Location: [facility] ORS;  Service: General;  Laterality: Left;   FRACTURE SURGERY     KNEE SURGERY     right   LAPAROSCOPIC  SUPRACERVICAL HYSTERECTOMY  08/05/2013   ORIF WRIST FRACTURE Left 01/17/2017   Procedure: OPEN REDUCTION INTERNAL FIXATION (ORIF) WRIST FRACTURE;  Surgeon: Leora Lynwood SAUNDERS, MD;  Location: [facility] OR[facility];  Service: Orthopedics;  Laterality: Left;   RADIOLOGY WITH ANESTHESIA N/A 03/18/2020   Procedure: MRI WITH ANESTHESIA CERVICAL SPINE AND BRAIN  WITH AND WITHOUT CONTRAST;  Surgeon: Radiologist, Medication, MD;  Location: [facility] OR;  Service: Radiology;  Laterality: N/A;   TUBAL LIGATION Bilateral    VAGINAL HYSTERECTOMY  03/2006   Patient Active Problem List   Diagnosis Date Noted   Hyperglycemia 01/09/2024   Abnormal LFTs (liver function tests) 09/17/2023   Sinus tachycardia 07/07/2023   Aortic atherosclerosis (HCC) 01/18/2023   Prediabetes 01/18/2023   Hypokalemia 07/29/2022   Weakness of both lower extremities 07/29/2022   Abscess of left groin 11/15/2021   Abnormal LFTs 11/15/2021   Acute respiratory disease due to COVID-19 virus 11/21/2020   Weakness    Hypoalbuminemia due to protein-calorie malnutrition [name])    Neurogenic bowel    Neurogenic bladder    Labile blood pressure    Neuropathic pain    Multiple sclerosis, relapsing-remitting (HCC) 07/09/2019   Abscess of female pelvis    SVT (supraventricular tachycardia) (HCC)    Radial styloid tenosynovitis 03/12/2018   Wheelchair  dependence 02/27/2018   Localized osteoporosis with current pathological fracture with routine healing 01/19/2017   Wrist fracture 01/16/2017   Sprain of ankle 03/23/2016   Closed fracture of lateral malleolus 03/16/2016   Health care maintenance 01/24/2016   Blood pressure elevated without history of HTN 10/25/2015   Essential hypertension 10/25/2015   Multiple sclerosis (HCC) 10/02/2015   Chronic left shoulder pain 07/19/2015   Multiple sclerosis exacerbation (HCC) 07/14/2015   MS (multiple sclerosis) (HCC) 11/26/2014   Increased body mass index 11/26/2014   HPV test positive 11/26/2014   Status post  laparoscopic supracervical hysterectomy 11/26/2014   Galactorrhea 11/26/2014   Back ache 05/21/2014   Adiposity 05/21/2014   Disordered sleep 05/21/2014   Muscle spasticity 05/21/2014   Spasticity 05/21/2014   Calculus of kidney 12/09/2013   Renal colic 12/09/2013   Hypercholesteremia 08/19/2013   Hereditary and idiopathic neuropathy 08/19/2013   Hypercholesterolemia without hypertriglyceridemia 08/19/2013   Bladder infection, chronic 07/25/2012   Disorder of bladder function 07/25/2012   Incomplete bladder emptying 07/25/2012   Microscopic hematuria 07/25/2012   Right upper quadrant pain 07/25/2012   ONSET DATE: 5/24 Amon and broke R leg which caused beginning of ADL decline; diagnosed with MS in 1995)  REFERRING DIAG: MS  THERAPY DIAG:  Muscle weakness (generalized)  Other lack of coordination  Multiple sclerosis exacerbation (HCC)  Rationale for Evaluation and Treatment: Rehabilitation  SUBJECTIVE:  SUBJECTIVE STATEMENT: Pt reports that her legs are feeling very tired today after her PT session. Pt accompanied by: self  PERTINENT HISTORY: Pt reports increased difficulty with basic self care tasks since breaking her R leg last May.  Since that time, mobility and ADLs have declined, with pt requiring assist from private caregivers and family to manage bathing, toileting, dressing, and functional transfers.  PRECAUTIONS: Fall  WEIGHT BEARING RESTRICTIONS: No  PAIN: 01/15/24: No pain  Are you having pain? No; occasional pain in R lower back, but not today  FALLS: Has patient fallen in last 6 months? No Last fall was May 17 of 2024  LIVING ENVIRONMENT: Lives with: lives with their family (including mother who has dementia and sister [name])  Lives in: 2 level home but pt resides on main level Stairs: ramp  Has following equipment at home: Wheelchair (manual), Shower bench, and hand held shower shower, 1 grab bar in the shower, 3in1 commode, sit to stand lift  (electric), FWW, hospital bed  PLOF: modified indep with ADL/IADLs prior to May of 7975   PATIENT GOALS: Increase independence with basic self care tasks  OBJECTIVE:  Note: Objective measures were completed at Evaluation unless otherwise noted.  HAND DOMINANCE: Right  ADLs:  Overall ADLs: assist provided from paid caregiver and sister Transfers/ambulation related to ADLs: sit to stand lift for all transfers, set up for sliding board in/out of bed  Eating: indep  Grooming: modified indep (wc level in mom's bathroom)  UB Dressing: set up (can't access her bedroom closet)  LB Dressing: able to sit on side of bed to don all LB clothing; assist to hike pants/underwear Toileting: assist with clothing management d/t standing within electric lift Bathing: bed bath with sister helping to wash backside Tub Shower transfers: N/A; pt has walk in shower in mom's bathroom (wc does fit in this bathroom but pt reports inability to transfer from wc and requires arm rests on a bench for a successful transfer attempt from any DME).  Tub bench is in pt's bathroom, but lift does not fit through bathroom doorway  and pt reports inability to transfer up from tub bench (currently unable to manage either transfer)   Equipment: see above  IADLs: Shopping: [employer] for shopping Light housekeeping: modified indep from wc level Meal Prep: modified indep from wc level Community mobility: relies on community transportation or family members Medication management: indep Landscape architect: indep Handwriting: NT; pt denies any FMC challenges  MOBILITY STATUS: Hx of falls  POSTURE COMMENTS:  Rounded shoulders, forward head, anterior pelvic tilt, and weight shift left   ACTIVITY TOLERANCE: Activity tolerance: Per PT; currently standing 30-60 sec within Light Gait harness/lift; currently non-ambulatory  UPPER EXTREMITY ROM:  BUEs WFL  UPPER EXTREMITY MMT:     MMT Right eval Left eval  Shoulder  flexion 4+ 4  Shoulder abduction 4+ 4  Shoulder adduction    Shoulder extension    Shoulder internal rotation 4+ 4+  Shoulder external rotation 4 4  Middle trapezius    Lower trapezius    Elbow flexion 4+ 4+  Elbow extension 4+ 4+  Wrist flexion 4+ 4+  Wrist extension 4+ 4+  Wrist ulnar deviation    Wrist radial deviation    Wrist pronation    Wrist supination    (Blank rows = not tested)  HAND FUNCTION/COORDINATION:  R/L WNL; pt denies any coordination deficits/ able to manipulate pills/clothing fasteners/etc without difficulty  11/15/23: *Measures taken d/t PT note revealing pt with increased difficulty manipulating smaller ADL supplies during reaching activities in PT session   11/15/23: Grip strength: R: 70 lbs, L: 63 lbs Pinch strength: Lateral: R: 16 lbs, L: 15 lbs; 3 point pinch: R: 12 lbs, L: 13 lbs 9 hole peg test: Right: 25 sec, L 49 sec   12/25/23: Grip strength: Right: 70 lbs; Left: 65 lbs  9 hole peg test: Right: 24 sec; Left: 3 trials: 54 sec, 1 min, 56 sec  SENSATION: WFL  EDEMA: No visible edema in BUEs  MUSCLE TONE: BUEs WNL  COGNITION: Overall cognitive status: Within functional limits for tasks assessed  VISION: wears glasses all the time, no reports of diplopia  PERCEPTION: Not tested  PRAXIS: WFL  OBSERVATIONS:  Pt pleasant, cooperative, and motivated to work towards improving indep with ADLs.                                                                                                     TREATMENT DATE: 01/15/24 Therapeutic Activity: -Facilitated L hand FMC skills: small bead pick up with resistive and narrow tweezers, placing beads into narrow jar -Facilitated L hand FMC/dexterity skills: Jamar peg placement; initial demo and intermittent min vc to isolate fingers with forearm on table top to reduce substitution patterns during peg manipulation/repositioning. -Core stability exercises with ball toss/catch from wc level; arm rests removed to  further challenge core stability.  Initial min vc to focus on controlled return to midline after reaching outside BOS in any direction when catching ball.  PATIENT EDUCATION: Education details: L hand FMC/dexterity skills Person educated: Patient Education method: Explanation, vc, demo Education comprehension: verbalized understanding, demonstrated understanding, further training needed  HOME  EXERCISE PROGRAM: IR A/AAROM to promote shoulder flexibility for peri care/bathing  GOALS: Goals reviewed with patient? Yes  SHORT TERM GOALS: Target date: 12/25/23  Pt will perform bed bath with set up only. Baseline: Eval: Min-mod A for posterior washing; 09/28/23: Min A for posterior washing; 11/13/23: Able to manage bed bath but sister helps with thoroughness, per pt's preference; pt in agreement to trial bath sitting EOB or in wc before next OT session. Goal status: achieved/d/c  2.  Pt will utilize sliding board for wc<>drop arm commode transfer with SBA. Baseline: Eval: Currently using electric lift for transfer to [facility]; 09/28/23: Not yet completed at home but transfers to commode have been easier without sliding board d/t pt implementing a scoot pivot. Goal status: d/c (pt does not prefer sliding board for commode transfer)  3.  Pt will be indep to perform HEP for maintaining BUE strength for ADLs and functional transfers. Baseline: Eval: HEP not yet initiated; 09/28/23: Pt is working on core stability exercises from wc, cane stretches for bilat shoulder IR, and tricep extension via wc pushups; 11/13/23: indep Goal status: achieved  LONG TERM GOALS: Target date: 02/05/24  Pt will perform sink bath with set up A. (Revised on 11/13/23 from min A to set up) Baseline: Eval: Currently bed bathing d/t inability to stand at sink without electric lift (lift does not fit into pt's bathroom); 09/28/23: still completing bed bath as pt is not yet able to stand; 11/13/23: Not yet attempted; pt encouraged to attempt  before next OT session now that bed bath goal has been met; 12/20/23: Pt is now completing sponge bath with min A while seated EOB (caregiver assists with washing peri area d/t no bed rail to aid in R lateral lean) Goal status: ongoing   2.  Pt will perform wc<>tub bench transfer with min A. Baseline: Eval: Unable; 09/28/23: Performed in OT clinic with min A, but not yet in the home Goal status: d/c (pt prefers sink bath)  3.  Pt will perform squat pivot transfer wc<>BSC with SBA.  Baseline: Eval: Eval: Currently using electric lift for transfer to [facility]; 09/28/23: Not yet completed in clinic with [facility], and using lift for [facility] in the home still; 11/13/23: Still using Camie lift at home and in clinic transfers are all scoot rather than squat pivots d/t LE weakness; 12/20/23: Pt reports that she plans to use her Camie lift until she can ambulate to her [facility] d/t limited space for wc in her desired location for [facility] at home. Goal status: d/c  4. Pt will perform scoot transfer wc<>toilet using grab bars with supv to allow toileting in community setting.  Baseline: Eval: Pt limits community outings d/t requiring use of lift for [facility] transfers; 09/28/23: Pt has performed 2 successful scoot pivot  Transfers to toilet in OT clinic with min A, extra time.  Further trials with clothing management on toilet needed, but pt can lower and hike  pants while sititng edge of mat via lateral leaning and min A to maintain core stability; 11/13/23: Pt performs with CGA and extra time; 12/19/23: Pt   performs with close supv on most attempts, occasional CGA; efficiency is improving  Goal status: ongoing  5.  Pt will complete seated clothing management with min A to enable voiding in handicapped stall within community. Baseline: Recert 11/13/23: Pt can transfer to toilet with increased time and effort, but has not yet attempted clothing management or voiding while seated on commode in community setting.  Pt can lower pants to knees  while seated in wc, but requires mod A to hike over hips from seated position; 12/20/23: Pt fully lowers panties and shorts while seated on commode with supv, can hike clothing to her her upper thighs, transfers back to wc, and then performs a tricep extension from wc level to allow caregiver to hike clothing remainder of the way for purposes of EC (min A for clothing management component) Goal status: achieved  6.  Pt will tolerate standing x1 min with close supv and BUE support to manage clothing in prep for toileting. Baseline: Eval: Currently standing in electric lift only, or within parallel bars without lift; 09/28/23: Not yet attempted; 11/13/23: Pt is able to perform static standing with heavy BUE support on parallel bars with PT; 12/19/22: Not attempted recently d/t new intermittent clonus in BLEs  Goal status: in progress  7.  Pt will increase bilat grip strength by (TBD) in order to securely grasp walker sufficiently for functional transfers and ambulation.  Baseline: Recert 11/13/23: TBD (PT note read following OT session, indicating pt with difficulty securing items in hand);  11/15/23: Grip strength: R: 70 lbs, L: 63 lbs; 12/25/23: R 70 lbs, L 65 lbs  Goal status: in progress  8.  Pt will increase bilat [facility] skills in order to manipulate small ADL supplies with reduced dropping as indicated by (TBD) improvement in 9 hole peg test. Baseline: Recert 11/13/23: 9 hole peg test TBD (PT note read following OT session, indicating pt with difficulty manipulating and securing items in hands); 11/15/23: Right: 25 sec, L 49 sec; 12/25/23: Right: 24 sec; Left: 3 trials: 54 sec, 1 min, 56 sec  Goal status: in progress  ASSESSMENT: CLINICAL IMPRESSION: Avoided transfers this date d/t increased fatigue in BLEs reported by pt after PT session.  Pt demonstrated good ability to manipulate resistive and narrow tweezers to pick up small beads and discard into narrow container.  Progressed to item storage and  translatory skills with small beads and grooved pegs, which is more challenging for pt based on repeated dropping.  Pt demonstrated improved ability to reposition pegs from horizontal to vertical position within L hand fingertips with fewer substitution patterns and less dropping.  Pt will continue to benefit from skilled OT to work towards above noted goals in OT poc, working to maximize indep with daily tasks while reducing burden of care on caregivers.   PERFORMANCE DEFICITS: in functional skills including ADLs, IADLs, strength, pain, flexibility, Gross motor control, mobility, balance, body mechanics, endurance, and decreased knowledge of use of DME, and psychosocial skills including coping strategies, environmental adaptation, habits, and routines and behaviors.   IMPAIRMENTS: are limiting patient from ADLs, IADLs, and social participation.   CO-MORBIDITIES: has co-morbidities such as neuropathy, back pain, obesity, MS, HTN that affects occupational performance. Patient will benefit from skilled OT to address above impairments and improve overall function.  MODIFICATION OR ASSISTANCE TO COMPLETE EVALUATION: No modification of tasks or assist necessary to complete an evaluation.  OT OCCUPATIONAL PROFILE AND HISTORY: Detailed assessment: Review of records and additional review of physical, cognitive, psychosocial history related to current functional performance.  CLINICAL DECISION MAKING: Moderate - several treatment options, min-mod task modification necessary  REHAB POTENTIAL: Good  EVALUATION COMPLEXITY: Moderate  PLAN:  OT FREQUENCY: 2x/week  OT DURATION: 12 weeks  PLANNED INTERVENTIONS: 97168 OT Re-evaluation, 97535 self care/ADL training, 02889 therapeutic exercise, 97530 therapeutic activity, 97112 neuromuscular re-education, 97140 manual therapy, 97116 gait training, 02989 moist heat,  97010 cryotherapy, balance training, functional mobility training, psychosocial skills training,  energy conservation, coping strategies training, patient/family education, and DME and/or AE instructions  RECOMMENDED OTHER SERVICES: None at this time (Pt currently receiving PT services in this clinic)  CONSULTED AND AGREED WITH PLAN OF CARE: Patient  PLAN FOR NEXT SESSION: see above  Inocente Blazing, MS, OTR/L  Inocente MARLA Blazing, OT 01/17/2024, 10:46 AM

## 2024-01-18 NOTE — Therapy (Signed)
 OUTPATIENT OCCUPATIONAL THERAPY NEURO TREATMENT NOTE  Patient Name: Lisa Williams MRN: [id number] DOB:1960-09-28, 63 y.o., female Today's Date: 01/18/2024  PCP: Dr. Layman Williams   Neurologist Dr. Suanne at [facility] REFERRING PROVIDER: Dr. Layman Williams    END OF SESSION:  OT End of Session - 01/18/24 1742     Visit Number 38    Number of Visits 44    Date for OT Re-Evaluation 02/05/24    Authorization Time Period Reporting period beginning 12/20/23    Progress Note Due on Visit 40    OT Start Time 1100    OT Stop Time 1145    OT Time Calculation (min) 45 min    Equipment Utilized During Treatment manual wc    Activity Tolerance Patient tolerated treatment well    Behavior During Therapy [facility] for tasks assessed/performed         Past Medical History:  Diagnosis Date   Abdominal pain, right upper quadrant    Back pain    Calculus of kidney 12/09/2013   Chronic back pain    unspecified   Chronic left shoulder pain 07/19/2015   Complication of anesthesia    Functional disorder of bladder    other   Galactorrhea 11/26/2014   Chronic    Hereditary and idiopathic neuropathy 08/19/2013   History of kidney stones    HPV test positive    Hypercholesteremia 08/19/2013   Hypertension    Incomplete bladder emptying    Microscopic hematuria    MS (multiple sclerosis) (HCC)    Muscle spasticity 05/21/2014   Nonspecific findings on examination of urine    other   Osteopenia    PONV (postoperative nausea and vomiting)    Status post laparoscopic supracervical hysterectomy 11/26/2014   Tobacco user 11/26/2014   Wrist fracture    Past Surgical History:  Procedure Laterality Date   bilateral tubal ligation  1996   BREAST CYST EXCISION Left 2002   CYST EXCISION Left 05/10/2022   Procedure: CYST REMOVAL;  Surgeon: Lisa Romano, Williams;  Location: [facility] ORS;  Service: General;  Laterality: Left;   FRACTURE SURGERY     KNEE SURGERY     right   LAPAROSCOPIC  SUPRACERVICAL HYSTERECTOMY  08/05/2013   ORIF WRIST FRACTURE Left 01/17/2017   Procedure: OPEN REDUCTION INTERNAL FIXATION (ORIF) WRIST FRACTURE;  Surgeon: Lisa Lynwood SAUNDERS, Williams;  Location: [facility] ORS;  Service: Orthopedics;  Laterality: Left;   RADIOLOGY WITH ANESTHESIA N/A 03/18/2020   Procedure: MRI WITH ANESTHESIA CERVICAL SPINE AND BRAIN  WITH AND WITHOUT CONTRAST;  Surgeon: Lisa Williams;  Location: [facility] OR;  Service: Radiology;  Laterality: N/A;   TUBAL LIGATION Bilateral    VAGINAL HYSTERECTOMY  03/2006   Patient Active Problem List   Diagnosis Date Noted   Hyperglycemia 01/09/2024   Abnormal LFTs (liver function tests) 09/17/2023   Sinus tachycardia 07/07/2023   Aortic atherosclerosis (HCC) 01/18/2023   Prediabetes 01/18/2023   Hypokalemia 07/29/2022   Weakness of both lower extremities 07/29/2022   Abscess of left groin 11/15/2021   Abnormal LFTs 11/15/2021   Acute respiratory disease due to COVID-19 virus 11/21/2020   Weakness    Hypoalbuminemia due to protein-calorie malnutrition [facility])    Neurogenic bowel    Neurogenic bladder    Labile blood pressure    Neuropathic pain    Multiple sclerosis, relapsing-remitting (HCC) 07/09/2019   Abscess of female pelvis    SVT (supraventricular tachycardia) (HCC)    Radial styloid tenosynovitis 03/12/2018   Wheelchair  dependence 02/27/2018   Localized osteoporosis with current pathological fracture with routine healing 01/19/2017   Wrist fracture 01/16/2017   Sprain of ankle 03/23/2016   Closed fracture of lateral malleolus 03/16/2016   Health care maintenance 01/24/2016   Blood pressure elevated without history of HTN 10/25/2015   Essential hypertension 10/25/2015   Multiple sclerosis (HCC) 10/02/2015   Chronic left shoulder pain 07/19/2015   Multiple sclerosis exacerbation (HCC) 07/14/2015   MS (multiple sclerosis) (HCC) 11/26/2014   Increased body mass index 11/26/2014   HPV test positive 11/26/2014   Status post  laparoscopic supracervical hysterectomy 11/26/2014   Galactorrhea 11/26/2014   Back ache 05/21/2014   Adiposity 05/21/2014   Disordered sleep 05/21/2014   Muscle spasticity 05/21/2014   Spasticity 05/21/2014   Calculus of kidney 12/09/2013   Renal colic 12/09/2013   Hypercholesteremia 08/19/2013   Hereditary and idiopathic neuropathy 08/19/2013   Hypercholesterolemia without hypertriglyceridemia 08/19/2013   Bladder infection, chronic 07/25/2012   Disorder of bladder function 07/25/2012   Incomplete bladder emptying 07/25/2012   Microscopic hematuria 07/25/2012   Right upper quadrant pain 07/25/2012   ONSET DATE: 5/24 Amon and broke R leg which caused beginning of ADL decline; diagnosed with MS in 1995)  REFERRING DIAG: MS  THERAPY DIAG:  Muscle weakness (generalized)  Other lack of coordination  Multiple sclerosis exacerbation (HCC)  Rationale for Evaluation and Treatment: Rehabilitation  SUBJECTIVE:  SUBJECTIVE STATEMENT: Pt reports that her legs are feeling very tired today after her PT session. Pt accompanied by: self  PERTINENT HISTORY: Pt reports increased difficulty with basic self care tasks since breaking her R leg last May.  Since that time, mobility and ADLs have declined, with pt requiring assist from private caregivers and family to manage bathing, toileting, dressing, and functional transfers.  PRECAUTIONS: Fall  WEIGHT BEARING RESTRICTIONS: No  PAIN: 01/17/24: No pain  Are you having pain? No; occasional pain in R lower back, but not today  FALLS: Has patient fallen in last 6 months? No Last fall was May 17 of 2024  LIVING ENVIRONMENT: Lives with: lives with their family (including mother who has dementia and sister Lisa Williams)  Lives in: 2 level home but pt resides on main level Stairs: ramp  Has following equipment at home: Wheelchair (manual), Shower bench, and hand held shower shower, 1 grab bar in the shower, 3in1 commode, sit to stand lift  (electric), FWW, hospital bed  PLOF: modified indep with ADL/IADLs prior to May of 7975   PATIENT GOALS: Increase independence with basic self care tasks  OBJECTIVE:  Note: Objective measures were completed at Evaluation unless otherwise noted.  HAND DOMINANCE: Right  ADLs:  Overall ADLs: assist provided from paid caregiver and sister Transfers/ambulation related to ADLs: sit to stand lift for all transfers, set up for sliding board in/out of bed  Eating: indep  Grooming: modified indep (wc level in mom's bathroom)  UB Dressing: set up (can't access her bedroom closet)  LB Dressing: able to sit on side of bed to don all LB clothing; assist to hike pants/underwear Toileting: assist with clothing management d/t standing within electric lift Bathing: bed bath with sister helping to wash backside Tub Shower transfers: N/A; pt has walk in shower in mom's bathroom (wc does fit in this bathroom but pt reports inability to transfer from wc and requires arm rests on a bench for a successful transfer attempt from any DME).  Tub bench is in pt's bathroom, but lift does not fit through bathroom doorway  and pt reports inability to transfer up from tub bench (currently unable to manage either transfer)   Equipment: see above  IADLs: Shopping: [employer] for shopping Light housekeeping: modified indep from wc level Meal Prep: modified indep from wc level Community mobility: relies on community transportation or family members Medication management: indep Landscape architect: indep Handwriting: NT; pt denies any [facility]MC challenges  MOBILITY STATUS: Hx of falls  POSTURE COMMENTS:  Rounded shoulders, forward head, anterior pelvic tilt, and weight shift left   ACTIVITY TOLERANCE: Activity tolerance: Per PT; currently standing 30-60 sec within Light Gait harness/lift; currently non-ambulatory  UPPER EXTREMITY ROM:  BUEs WFL  UPPER EXTREMITY MMT:     MMT Right eval Left eval  Shoulder  flexion 4+ 4  Shoulder abduction 4+ 4  Shoulder adduction    Shoulder extension    Shoulder internal rotation 4+ 4+  Shoulder external rotation 4 4  Middle trapezius    Lower trapezius    Elbow flexion 4+ 4+  Elbow extension 4+ 4+  Wrist flexion 4+ 4+  Wrist extension 4+ 4+  Wrist ulnar deviation    Wrist radial deviation    Wrist pronation    Wrist supination    (Blank rows = not tested)  HAND FUNCTION/COORDINATION:  R/L WNL; pt denies any coordination deficits/ able to manipulate pills/clothing fasteners/etc without difficulty  11/15/23: *Measures taken d/t PT note revealing pt with increased difficulty manipulating smaller ADL supplies during reaching activities in PT session   11/15/23: Grip strength: R: 70 lbs, L: 63 lbs Pinch strength: Lateral: R: 16 lbs, L: 15 lbs; 3 point pinch: R: 12 lbs, L: 13 lbs 9 hole peg test: Right: 25 sec, L 49 sec   12/25/23: Grip strength: Right: 70 lbs; Left: 65 lbs  9 hole peg test: Right: 24 sec; Left: 3 trials: 54 sec, 1 min, 56 sec  SENSATION: WFL  EDEMA: No visible edema in BUEs  MUSCLE TONE: BUEs WNL  COGNITION: Overall cognitive status: Within functional limits for tasks assessed  VISION: wears glasses all the time, no reports of diplopia  PERCEPTION: Not tested  PRAXIS: WFL  OBSERVATIONS:  Pt pleasant, cooperative, and motivated to work towards improving indep with ADLs.                                                                                                     TREATMENT DATE: 01/17/24 Self Care: -Sit to stand prep for ADL transfers: Pt practiced forward weight shift to achieve partial sit to stand from wc level, knees blocked with pillow cushioning edge of chair in front of her (chair stabilized by wall) -OT placed 2.5 foam cushion on top of wc cushion to increase seat height to improve sit to stand attempts, and provided min vc for increasing forward weight shift and increasing Wbing through LEs during each attempt  x10 reps -Bathing simulation while seated on edge of mat table, using wash cloth to practice reaching for posterior peri care while performing lateral weight shifts with supv; wc placed in front of pt to block knees and provide security  with any anterior LOB -Advised on additional bed supports, including bed ladder and options to obtain -Practiced stabilizing self with edge of mat table during R lateral WS, as foot of hospital bed would also be within pt's reach at home to provide additional UE support.  Therapeutic Activity: -Pillows placed beneath buttocks and behind pt on mat table to perform a second trial of above bathing simulation, with pillows more closely simulating softer mattress surface where pt completes bathing at home.  Pt practiced lateral and posterior WS with pillows beneath her, and pushing self back to midline from all directions with close supv.    PATIENT EDUCATION: Education details: bathing strategies and bed supports Person educated: Patient Education method: Explanation, vc, demo Education comprehension: verbalized understanding, demonstrated understanding, further training needed  HOME EXERCISE PROGRAM: IR A/AAROM to promote shoulder flexibility for peri care/bathing  GOALS: Goals reviewed with patient? Yes  SHORT TERM GOALS: Target date: 12/25/23  Pt will perform bed bath with set up only. Baseline: Eval: Min-mod A for posterior washing; 09/28/23: Min A for posterior washing; 11/13/23: Able to manage bed bath but sister helps with thoroughness, per pt's preference; pt in agreement to trial bath sitting EOB or in wc before next OT session. Goal status: achieved/d/c  2.  Pt will utilize sliding board for wc<>drop arm commode transfer with SBA. Baseline: Eval: Currently using electric lift for transfer to [facility]; 09/28/23: Not yet completed at home but transfers to commode have been easier without sliding board d/t pt implementing a scoot pivot. Goal status: d/c (pt does  not prefer sliding board for commode transfer)  3.  Pt will be indep to perform HEP for maintaining BUE strength for ADLs and functional transfers. Baseline: Eval: HEP not yet initiated; 09/28/23: Pt is working on core stability exercises from wc, cane stretches for bilat shoulder IR, and tricep extension via wc pushups; 11/13/23: indep Goal status: achieved  LONG TERM GOALS: Target date: 02/05/24  Pt will perform sink bath with set up A. (Revised on 11/13/23 from min A to set up) Baseline: Eval: Currently bed bathing d/t inability to stand at sink without electric lift (lift does not fit into pt's bathroom); 09/28/23: still completing bed bath as pt is not yet able to stand; 11/13/23: Not yet attempted; pt encouraged to attempt before next OT session now that bed bath goal has been met; 12/20/23: Pt is now completing sponge bath with min A while seated EOB (caregiver assists with washing peri area d/t no bed rail to aid in R lateral lean) Goal status: ongoing   2.  Pt will perform wc<>tub bench transfer with min A. Baseline: Eval: Unable; 09/28/23: Performed in OT clinic with min A, but not yet in the home Goal status: d/c (pt prefers sink bath)  3.  Pt will perform squat pivot transfer wc<>BSC with SBA.  Baseline: Eval: Eval: Currently using electric lift for transfer to [facility]; 09/28/23: Not yet completed in clinic with [facility], and using lift for [facility] in the home still; 11/13/23: Still using Camie lift at home and in clinic transfers are all scoot rather than squat pivots d/t LE weakness; 12/20/23: Pt reports that she plans to use her Camie lift until she can ambulate to her [facility] d/t limited space for wc in her desired location for [facility] at home. Goal status: d/c  4. Pt will perform scoot transfer wc<>toilet using grab bars with supv to allow toileting in community setting.  Baseline: Eval: Pt limits community outings d/t  requiring use of lift for [facility] transfers; 09/28/23: Pt has performed 2 successful scoot  pivot  Transfers to toilet in OT clinic with min A, extra time.  Further trials with clothing management on toilet needed, but pt can lower and hike  pants while sititng edge of mat via lateral leaning and min A to maintain core stability; 11/13/23: Pt performs with CGA and extra time; 12/19/23: Pt   performs with close supv on most attempts, occasional CGA; efficiency is improving  Goal status: ongoing  5.  Pt will complete seated clothing management with min A to enable voiding in handicapped stall within community. Baseline: Recert 11/13/23: Pt can transfer to toilet with increased time and effort, but has not yet attempted clothing management or voiding while seated on commode in community setting.  Pt can lower pants to knees while seated in wc, but requires mod A to hike over hips from seated position; 12/20/23: Pt fully lowers panties and shorts while seated on commode with supv, can hike clothing to her her upper thighs, transfers back to wc, and then performs a tricep extension from wc level to allow caregiver to hike clothing remainder of the way for purposes of EC (min A for clothing management component) Goal status: achieved  6.  Pt will tolerate standing x1 min with close supv and BUE support to manage clothing in prep for toileting. Baseline: Eval: Currently standing in electric lift only, or within parallel bars without lift; 09/28/23: Not yet attempted; 11/13/23: Pt is able to perform static standing with heavy BUE support on parallel bars with PT; 12/19/22: Not attempted recently d/t new intermittent clonus in BLEs  Goal status: in progress  7.  Pt will increase bilat grip strength by (TBD) in order to securely grasp walker sufficiently for functional transfers and ambulation.  Baseline: Recert 11/13/23: TBD (PT note read following OT session, indicating pt with difficulty securing items in hand);  11/15/23: Grip strength: R: 70 lbs, L: 63 lbs; 12/25/23: R 70 lbs, L 65 lbs  Goal status: in  progress  8.  Pt will increase bilat [facility] skills in order to manipulate small ADL supplies with reduced dropping as indicated by (TBD) improvement in 9 hole peg test. Baseline: Recert 11/13/23: 9 hole peg test TBD (PT note read following OT session, indicating pt with difficulty manipulating and securing items in hands); 11/15/23: Right: 25 sec, L 49 sec; 12/25/23: Right: 24 sec; Left: 3 trials: 54 sec, 1 min, 56 sec  Goal status: in progress  ASSESSMENT: CLINICAL IMPRESSION: Pt found placement of wc in front of her to give her increased confidence while weight shifting to perform simulated posterior peri care on edge of mat table to simulate bed bath at home.  Pt plans to position wc as noted above and sit within reach of foot of bed at home to allow additional UE support when weight shifting to the R.  Pt anticipates that these positioning adjustments should further maximize her indep with bed bath at home.  Pt will continue to benefit from skilled OT to work towards above noted goals in OT poc, working to maximize indep with daily tasks while reducing burden of care on caregivers.   PERFORMANCE DEFICITS: in functional skills including ADLs, IADLs, strength, pain, flexibility, Gross motor control, mobility, balance, body mechanics, endurance, and decreased knowledge of use of DME, and psychosocial skills including coping strategies, environmental adaptation, habits, and routines and behaviors.   IMPAIRMENTS: are limiting patient from ADLs, IADLs, and social  participation.   CO-MORBIDITIES: has co-morbidities such as neuropathy, back pain, obesity, MS, HTN that affects occupational performance. Patient will benefit from skilled OT to address above impairments and improve overall function.  MODIFICATION OR ASSISTANCE TO COMPLETE EVALUATION: No modification of tasks or assist necessary to complete an evaluation.  OT OCCUPATIONAL PROFILE AND HISTORY: Detailed assessment: Review of records and additional  review of physical, cognitive, psychosocial history related to current functional performance.  CLINICAL DECISION MAKING: Moderate - several treatment options, min-mod task modification necessary  REHAB POTENTIAL: Good  EVALUATION COMPLEXITY: Moderate  PLAN:  OT FREQUENCY: 2x/week  OT DURATION: 12 weeks  PLANNED INTERVENTIONS: 97168 OT Re-evaluation, 97535 self care/ADL training, 02889 therapeutic exercise, 97530 therapeutic activity, 97112 neuromuscular re-education, 97140 manual therapy, 97116 gait training, 02989 moist heat, 97010 cryotherapy, balance training, functional mobility training, psychosocial skills training, energy conservation, coping strategies training, patient/family education, and DME and/or AE instructions  RECOMMENDED OTHER SERVICES: None at this time (Pt currently receiving PT services in this clinic)  CONSULTED AND AGREED WITH PLAN OF CARE: Patient  PLAN FOR NEXT SESSION: see above  Inocente Blazing, MS, OTR/L  Inocente MARLA Blazing, OT 01/18/2024, 5:43 PM

## 2024-01-22 ENCOUNTER — Ambulatory Visit

## 2024-01-22 ENCOUNTER — Ambulatory Visit: Payer: PPO

## 2024-01-22 DIAGNOSIS — M6281 Muscle weakness (generalized): Secondary | ICD-10-CM | POA: Diagnosis not present

## 2024-01-22 DIAGNOSIS — R278 Other lack of coordination: Secondary | ICD-10-CM

## 2024-01-22 DIAGNOSIS — G35 Multiple sclerosis: Secondary | ICD-10-CM

## 2024-01-22 DIAGNOSIS — R269 Unspecified abnormalities of gait and mobility: Secondary | ICD-10-CM

## 2024-01-22 DIAGNOSIS — R2689 Other abnormalities of gait and mobility: Secondary | ICD-10-CM

## 2024-01-22 DIAGNOSIS — R262 Difficulty in walking, not elsewhere classified: Secondary | ICD-10-CM

## 2024-01-22 DIAGNOSIS — R2681 Unsteadiness on feet: Secondary | ICD-10-CM

## 2024-01-22 LAB — GENECONNECT MOLECULAR SCREEN: Genetic Analysis Overall Interpretation: NEGATIVE

## 2024-01-22 NOTE — Therapy (Signed)
 OUTPATIENT PHYSICAL THERAPY NEURO TREATMENT  Patient Name: Lisa Williams MRN: [id number] DOB:[birth year], 63 y.o., female Today's Date: 01/22/2024  PCP: Lenon Na  REFERRING PROVIDER: Lenon Na  END OF SESSION:  PT End of Session - 01/22/24 1020     Visit Number 53    Number of Visits 70    Date for PT Re-Evaluation 03/20/24   corrected   Progress Note Due on Visit 50    PT Start Time 1014    PT Stop Time 1100    PT Time Calculation (min) 46 min    Equipment Utilized During Treatment Gait belt    Activity Tolerance Patient tolerated treatment well;No increased pain    Behavior During Therapy [facility] for tasks assessed/performed            Past Medical History:  Diagnosis Date   Abdominal pain, right upper quadrant    Back pain    Calculus of kidney 12/09/2013   Chronic back pain    unspecified   Chronic left shoulder pain 07/19/2015   Complication of anesthesia    Functional disorder of bladder    other   Galactorrhea 11/26/2014   Chronic    Hereditary and idiopathic neuropathy 08/19/2013   History of kidney stones    HPV test positive    Hypercholesteremia 08/19/2013   Hypertension    Incomplete bladder emptying    Microscopic hematuria    MS (multiple sclerosis) (HCC)    Muscle spasticity 05/21/2014   Nonspecific findings on examination of urine    other   Osteopenia    PONV (postoperative nausea and vomiting)    Status post laparoscopic supracervical hysterectomy 11/26/2014   Tobacco user 11/26/2014   Wrist fracture    Past Surgical History:  Procedure Laterality Date   bilateral tubal ligation  1996   BREAST CYST EXCISION Left 2002   CYST EXCISION Left 05/10/2022   Procedure: CYST REMOVAL;  Surgeon: Rodolph Romano, MD;  Location: [facility] ORS;  Service: General;  Laterality: Left;   FRACTURE SURGERY     KNEE SURGERY     right   LAPAROSCOPIC SUPRACERVICAL HYSTERECTOMY  08/05/2013   ORIF WRIST FRACTURE Left 01/17/2017    Procedure: OPEN REDUCTION INTERNAL FIXATION (ORIF) WRIST FRACTURE;  Surgeon: Leora Lynwood SAUNDERS, MD;  Location: [facility] ORS;  Service: Orthopedics;  Laterality: Left;   RADIOLOGY WITH ANESTHESIA N/A 03/18/2020   Procedure: MRI WITH ANESTHESIA CERVICAL SPINE AND BRAIN  WITH AND WITHOUT CONTRAST;  Surgeon: Radiologist, Medication, MD;  Location: [facility] OR;  Service: Radiology;  Laterality: N/A;   TUBAL LIGATION Bilateral    VAGINAL HYSTERECTOMY  03/2006   Patient Active Problem List   Diagnosis Date Noted   Hyperglycemia 01/09/2024   Abnormal LFTs (liver function tests) 09/17/2023   Sinus tachycardia 07/07/2023   Aortic atherosclerosis (HCC) 01/18/2023   Prediabetes 01/18/2023   Hypokalemia 07/29/2022   Weakness of both lower extremities 07/29/2022   Abscess of left groin 11/15/2021   Abnormal LFTs 11/15/2021   Acute respiratory disease due to COVID-19 virus 11/21/2020   Weakness    Hypoalbuminemia due to protein-calorie malnutrition [facility])    Neurogenic bowel    Neurogenic bladder    Labile blood pressure    Neuropathic pain    Multiple sclerosis, relapsing-remitting (HCC) 07/09/2019   Abscess of female pelvis    SVT (supraventricular tachycardia) (HCC)    Radial styloid tenosynovitis 03/12/2018   Wheelchair dependence 02/27/2018   Localized osteoporosis with current pathological fracture with routine healing 01/19/2017  Wrist fracture 01/16/2017   Sprain of ankle 03/23/2016   Closed fracture of lateral malleolus 03/16/2016   Health care maintenance 01/24/2016   Blood pressure elevated without history of HTN 10/25/2015   Essential hypertension 10/25/2015   Multiple sclerosis (HCC) 10/02/2015   Chronic left shoulder pain 07/19/2015   Multiple sclerosis exacerbation (HCC) 07/14/2015   MS (multiple sclerosis) (HCC) 11/26/2014   Increased body mass index 11/26/2014   HPV test positive 11/26/2014   Status post laparoscopic supracervical hysterectomy 11/26/2014   Galactorrhea 11/26/2014    Back ache 05/21/2014   Adiposity 05/21/2014   Disordered sleep 05/21/2014   Muscle spasticity 05/21/2014   Spasticity 05/21/2014   Calculus of kidney 12/09/2013   Renal colic 12/09/2013   Hypercholesteremia 08/19/2013   Hereditary and idiopathic neuropathy 08/19/2013   Hypercholesterolemia without hypertriglyceridemia 08/19/2013   Bladder infection, chronic 07/25/2012   Disorder of bladder function 07/25/2012   Incomplete bladder emptying 07/25/2012   Microscopic hematuria 07/25/2012   Right upper quadrant pain 07/25/2012    ONSET DATE: 1995  REFERRING DIAG: MS  THERAPY DIAG:  Muscle weakness (generalized)  Other lack of coordination  Multiple sclerosis exacerbation (HCC)  Difficulty in walking, not elsewhere classified  Unsteadiness on feet  Abnormality of gait and mobility  Other abnormalities of gait and mobility  Rationale for Evaluation and Treatment: Rehabilitation  SUBJECTIVE:                                                                                                                                                                                             SUBJECTIVE STATEMENT:  Pt reports she woke up and felt really good in the LE's.  Pt feels like she's going to walk well today!  Pt accompanied by: self  PERTINENT HISTORY:  Patient is returning to PT s/p hospitalization.  s/p ORIF of R tibia shaft fracture 09/23/2022. Patient has weakness in BLE with RLE>LLE. She drives with hand controls. Patient has been diagnosed with MS in 1995. PMH includes: back pain, CBP, chronic L shoulder pain, galactorrhea, neuropathy, HPV, hypercholestermeia, HTN, MS, osteopenia, PONV, wrist fracture. Additional order for other closed fracture of proximal end of R tibia with routine healing. Still has to use [employer].   PAIN:  Are you having pain? Occasional pain in RLE; primarily in knee  PRECAUTIONS: Fall  RED FLAGS: None   WEIGHT BEARING RESTRICTIONS: No  FALLS:  Has patient fallen in last 6 months? No  LIVING ENVIRONMENT: Lives with: lives with their family Lives in: House/apartment Stairs: ramp Has following equipment at home: Vannie - 2 wheeled, Wheelchair (manual), shower chair, and Grab bars  PLOF: Independent with household mobility with device  PATIENT GOALS: to get her independence back. To be able to get into car, toilet, and get dressed independently  OBJECTIVE:  Note: Objective measures were completed at Evaluation unless otherwise noted.  DIAGNOSTIC FINDINGS: MRI of the brain 03/18/2020 showed multiple T2/FLAIR hyperintense foci in the periventricular, juxtacortical and deep white matter.  There were no infratentorial lesions noted.  None of the foci enhanced.   Due to severe claustrophobia, the study was done with conscious sedation in the hospital   COGNITION: Overall cognitive status: Within functional limits for tasks assessed   SENSATION: Lack of sensation in feet, loss in bilateral lateral aspect of knee  COORDINATION: Does not have the strength for functional LE heel slide test   MUSCLE TONE: BLE mild tone    POSTURE: rounded shoulders, forward head, anterior pelvic tilt, and weight shift left   LOWER EXTREMITY MMT:    MMT Right Eval Left Eval  Hip flexion 0.9 1.5  Hip extension    Hip abduction 1.8 2.6  Hip adduction 3.1 1.9  Hip internal rotation    Hip external rotation    Knee flexion 0.9 1.5  Knee extension 0.8 1.2  Ankle dorsiflexion    Ankle plantarflexion    Ankle inversion    Ankle eversion    (Blank rows = not tested)  BED MOBILITY:  Assess in future session due to limited time  TRANSFERS: Assistive device utilized: Bariatric RW with sit to stands, slide board to table  Sit to stand: unable to reach full stand Stand to sit: unable to reach full stand  Chair to chair: slide board with CGA   GAIT: Unable to ambulate at this time.   FUNCTIONAL TESTS:  Sit to stand: tricep press with  BUE; unable to bring arm to walker.    Function In Sitting Test (FIST)  (1/2 femur on surface; hips/knees flexed to 90deg)   - indicate bed or mat table / step stool if used  SCORING KEY: 4 = Independent (completes task independently & successfully) 3 = Verbal Cues/Increased Time (completes task independently & successfully and only needs more time/cues) 2 = Upper Extremity Support (must use UE for support or assistance to complete successfully) 1 = Needs Assistance (unable to complete w/o physical assist; DOCUMENT LEVEL: min, mod, max) 0 = Dependent (requires complete physical assist; unable to complete successfully even w/ physical assist)  Randomly Administer Once Throughout Exam  4 - Anterior Nudge (superior sternum)  4 - Posterior Nudge (between scapular spines)  4 - Lateral Nudge (to dominant side at acromion)     4 - Static sitting (30 seconds)  4 - Sitting, shake 'no' (left and right)  4 - Sitting, eyes closed (30 seconds)   0 - Sitting, lift foot (dominant side, lift foot 1 inch twice)    2 - Pick up object from behind (object at midline, hands breadth posterior)  3 - Forward reach (use dominant arm, must complete full motion) 2 - Lateral reach (use dominant arm, clear opposite ischial tuberosity) 2 - Pick up object from floor (from between feet)   2 - Posterior scooting (move backwards 2 inches)  2 - Anterior scooting (move forward 2 inches)  2 - Lateral scooting (move to dominant side 2 inches)    TOTAL = 39/56  Notes/comments: slide board tranfer to/from table   MCD > 5 points MCID for IP REHAB > 6 points  Function In Sitting Test (FIST) 08/14/23 (1/2 femur  on surface; hips/knees flexed to 90deg)   - indicate bed or mat table / step stool if used  SCORING KEY: 4 = Independent (completes task independently & successfully) 3 = Verbal Cues/Increased Time (completes task independently & successfully and only needs more time/cues) 2 = Upper Extremity Support (must  use UE for support or assistance to complete successfully) 1 = Needs Assistance (unable to complete w/o physical assist; DOCUMENT LEVEL: min, mod, max) 0 = Dependent (requires complete physical assist; unable to complete successfully even w/ physical assist)  Randomly Administer Once Throughout Exam  4 - Anterior Nudge (superior sternum)  4 - Posterior Nudge (between scapular spines)  4 - Lateral Nudge (to dominant side at acromion)     4 - Static sitting (30 seconds)  4 - Sitting, shake 'no' (left and right)  4 - Sitting, eyes closed (30 seconds)   0 - Sitting, lift foot (dominant side, lift foot 1 inch twice)    4 - Pick up object from behind (object at midline, hands breadth posterior)  4 - Forward reach (use dominant arm, must complete full motion) 4 - Lateral reach (use dominant arm, clear opposite ischial tuberosity)  - Pick up object from floor (from between feet)   3 - Posterior scooting (move backwards 2 inches)  3 - Anterior scooting (move forward 2 inches)  3 - Lateral scooting (move to dominant side 2 inches)    TOTAL = 47/56  MCD > 5 points MCID for IP REHAB > 6 points                                                                                                                              TREATMENT DATE: 01/22/24  Gait Training:  Pt performed better with the use of the // bars and ambulation attempts.  Pt was able to walk the length of the // bars twice altogether and only having one seated rest break in between the attempts.  Pt with much better stride length on the R LE which enabled her to weight shift to the R and allow for forward progression of the L LE.  Wheelchair follow utilized as well.   TherAct: To improve functional movements patterns for everyday tasks  Standing frame (using the Strap stand): Patient able to push up from chair to minimally raise her bottom to allow author to position gluteal padding underneath her.   Dependent with foot straps  and locking of frame.  Standing balloon taps, 30 sec bouts x4 total reps with intermittent UE support  Standing connect4 game for cognitive challenge while standing with UE use for placing game pieces, 3 total games  Standing reaches outside of BOS, to promote core stability, 2x10   Pt noted to have the R knee adducted when in the standing frame and towel was placed to improve overall alignment and prevent the soreness that she experienced from the last minute.      PATIENT EDUCATION:  Education details: Pt educated throughout session about proper posture and technique with exercises. Improved exercise technique, movement at target joints, use of target muscles after min to mod verbal, visual, tactile cues.  Person educated: Patient Education method: Explanation, Demonstration, Tactile cues, and Verbal cues Education comprehension: verbalized understanding, returned demonstration, verbal cues required, tactile cues required, and needs further education  HOME EXERCISE PROGRAM: Stand 3x/day in stander   GOALS: Goals reviewed with patient? Yes  SHORT TERM GOALS: Target date: 08/08/2023  Patient will be independent in home exercise program to improve strength/mobility for better functional independence with ADLs.  Baseline: 4/8: compliance Goal status: MET    LONG TERM GOALS: Target date: 03/20/2024  Patient will tolerate five consecutive stands with UE support from wheelchair to standing to improve functional mobility with additional cushion and RW.  Baseline: unable to perform 4/8:unable to perform full stand 5/20: perform next session 5/29: two full stands with CGA and cushion on her seat 7/31: able to stand 3 times from [facility]. With no addition cushion  in parallel bars. 12/27/2023= Will retest next session- concentrated on just standing on past 2 visits Goal status: Ongoing   2.  Patient will ambulate 10 ft with bRW with wheelchair follow.  Baseline:  unable to ambulate 4/8:  unable to ambulate 5/20: able to ambulate length of // bars in previous session 5/27: ambulate 2.5 lengths of // bars with two seated rest breaks with min A for weight shift and wheelchair follow  7/31: able to ambulate in parallel bars 3 ft with min assist and +2 follow in WC> difficulty advancing the LLE on this day. 12/27/2023- Patient unable to take a step forward today but was abel to take a step on previous visit- limited by clonus like trembling that has progressed over past month. Goal status: Ongoing   3.  Patient will improve FIST score >6 points to demonstrate improved stability and ability to perform ADLs.  Baseline:  3/6: 39/56 4/8: 47/ 56 5/20: 48/56 7/31:46/56 Goal status: MET  4.  Patient will perform toileting with mod I at home for improved independence.  Baseline: 3/6: requires assistance 4/8: requires dependence on machine 5/20: requires assistance with use of wipe buddy.  7/31: continues to require use of sara steady and wipe buddy for safety and hygiene. 12/27/2023- Patient reports that since she is unable to take steps that she continues to require use of sara steady and wipe buddy for safe personal hygiene. Goal status: ongoing   5. Patient will perform > 5 min of static or dynamic standing with BUE and Min/mod assist  for improved LE strength and pre-gait function to assist with toileting and overall standing ability for improve functional independence.  Baseline: 12/27/2023-Patient was able to stand for just over 3 min today. 01/10/24: Pt able to stand 1 min 38 sec Goal status: NOT PROGRESSING    ASSESSMENT:  CLINICAL IMPRESSION:  Pt performed well with the exercises and performed better with the ambulation attempt.  Pt was able to ambulate in the // bars the length x2.  Pt also performed well with the standing frame, however was still having issues with the R knee adducting and was given a towel roll and placed in between to keep the alignment.  This occurs when the pt  was performing the balloon taps, and more dynamic activities.   Pt will continue to benefit from skilled therapy to address remaining deficits in order to improve overall QoL and return to PLOF.  OBJECTIVE IMPAIRMENTS: Abnormal gait, cardiopulmonary status limiting activity, decreased activity tolerance, decreased balance, decreased coordination, decreased endurance, decreased mobility, difficulty walking, decreased ROM, decreased strength, hypomobility, increased fascial restrictions, impaired perceived functional ability, impaired flexibility, impaired sensation, improper body mechanics, and postural dysfunction.   ACTIVITY LIMITATIONS: carrying, lifting, bending, sitting, standing, squatting, sleeping, stairs, transfers, bed mobility, continence, bathing, toileting, dressing, self feeding, reach over head, hygiene/grooming, locomotion level, and caring for others  PARTICIPATION LIMITATIONS: meal prep, cleaning, laundry, interpersonal relationship, driving, shopping, community activity, and church  PERSONAL FACTORS: Age, Past/current experiences, Time since onset of injury/illness/exacerbation, Transportation, and 3+ comorbidities: ack pain, CBP, chronic L shoulder pain, galactorrhea, neuropathy, HPV, hypercholestermeia, HTN, MS, osteopenia, PONV, wrist fracture are also affecting patient's functional outcome.   REHAB POTENTIAL: Good  CLINICAL DECISION MAKING: Evolving/moderate complexity  EVALUATION COMPLEXITY: Moderate  PLAN:  PT FREQUENCY: 2x/week  PT DURATION: 12 weeks  PLANNED INTERVENTIONS: 97164- PT Re-evaluation, 97110-Therapeutic exercises, 97530- Therapeutic activity, 97112- Neuromuscular re-education, 97535- Self Care, [id number]- Manual therapy, [phone]- Gait training, [phone]- Orthotic Fit/training, [phone]- Canalith repositioning, [id number]- Aquatic Therapy, [phone]- Electrical stimulation (unattended), [phone]- Electrical stimulation (manual), [id number]- Ultrasound, [id number]- Traction  (mechanical), Patient/Family education, Balance training, Stair training, Taping, Dry Needling, Joint mobilization, Joint manipulation, Spinal manipulation, Spinal mobilization, Scar mobilization, Compression bandaging, Vestibular training, Visual/preceptual remediation/compensation, Cognitive remediation, DME instructions, Cryotherapy, Moist heat, and Biofeedback  PLAN FOR NEXT SESSION:  Strap stand frame with more dynamic activities Sit to stands; try supported walking, continue dynamic core stabilization training and seated reaching tasks to improve weight shifting.    Fonda Simpers, PT, DPT Physical Therapist - [facility]  01/22/24, 11:20 AM

## 2024-01-22 NOTE — Therapy (Signed)
 OUTPATIENT OCCUPATIONAL THERAPY NEURO TREATMENT NOTE  Patient Name: Lisa Williams MRN: [id number] DOB:01/19/1961, 63 y.o., female Today's Date: 01/22/2024  PCP: Dr. Layman Williams   Neurologist Dr. Suanne at [facility] REFERRING PROVIDER: Dr. Layman Williams    END OF SESSION:  OT End of Session - 01/22/24 1250     Visit Number 39    Number of Visits 44    Date for OT Re-Evaluation 02/05/24    Authorization Time Period Reporting period beginning 12/20/23    Progress Note Due on Visit 40    OT Start Time 1100    OT Stop Time 1145    OT Time Calculation (min) 45 min    Equipment Utilized During Treatment manual wc    Activity Tolerance Patient tolerated treatment well    Behavior During Therapy [facility] for tasks assessed/performed         Past Medical History:  Diagnosis Date   Abdominal pain, right upper quadrant    Back pain    Calculus of kidney 12/09/2013   Chronic back pain    unspecified   Chronic left shoulder pain 07/19/2015   Complication of anesthesia    Functional disorder of bladder    other   Galactorrhea 11/26/2014   Chronic    Hereditary and idiopathic neuropathy 08/19/2013   History of kidney stones    HPV test positive    Hypercholesteremia 08/19/2013   Hypertension    Incomplete bladder emptying    Microscopic hematuria    MS (multiple sclerosis) (HCC)    Muscle spasticity 05/21/2014   Nonspecific findings on examination of urine    other   Osteopenia    PONV (postoperative nausea and vomiting)    Status post laparoscopic supracervical hysterectomy 11/26/2014   Tobacco user 11/26/2014   Wrist fracture    Past Surgical History:  Procedure Laterality Date   bilateral tubal ligation  1996   BREAST CYST EXCISION Left 2002   CYST EXCISION Left 05/10/2022   Procedure: CYST REMOVAL;  Surgeon: Lisa Romano, MD;  Location: [facility] ORS;  Service: General;  Laterality: Left;   FRACTURE SURGERY     KNEE SURGERY     right   LAPAROSCOPIC  SUPRACERVICAL HYSTERECTOMY  08/05/2013   ORIF WRIST FRACTURE Left 01/17/2017   Procedure: OPEN REDUCTION INTERNAL FIXATION (ORIF) WRIST FRACTURE;  Surgeon: Lisa Lynwood SAUNDERS, MD;  Location: [facility] ORS;  Service: Orthopedics;  Laterality: Left;   RADIOLOGY WITH ANESTHESIA N/A 03/18/2020   Procedure: MRI WITH ANESTHESIA CERVICAL SPINE AND BRAIN  WITH AND WITHOUT CONTRAST;  Surgeon: Radiologist, Medication, MD;  Location: [facility] OR;  Service: Radiology;  Laterality: N/A;   TUBAL LIGATION Bilateral    VAGINAL HYSTERECTOMY  03/2006   Patient Active Problem List   Diagnosis Date Noted   Hyperglycemia 01/09/2024   Abnormal LFTs (liver function tests) 09/17/2023   Sinus tachycardia 07/07/2023   Aortic atherosclerosis (HCC) 01/18/2023   Prediabetes 01/18/2023   Hypokalemia 07/29/2022   Weakness of both lower extremities 07/29/2022   Abscess of left groin 11/15/2021   Abnormal LFTs 11/15/2021   Acute respiratory disease due to COVID-19 virus 11/21/2020   Weakness    Hypoalbuminemia due to protein-calorie malnutrition Lisa Williams)    Neurogenic bowel    Neurogenic bladder    Labile blood pressure    Neuropathic pain    Multiple sclerosis, relapsing-remitting (HCC) 07/09/2019   Abscess of female pelvis    SVT (supraventricular tachycardia) (HCC)    Radial styloid tenosynovitis 03/12/2018   Wheelchair  dependence 02/27/2018   Localized osteoporosis with current pathological fracture with routine healing 01/19/2017   Wrist fracture 01/16/2017   Sprain of ankle 03/23/2016   Closed fracture of lateral malleolus 03/16/2016   Health care maintenance 01/24/2016   Blood pressure elevated without history of HTN 10/25/2015   Essential hypertension 10/25/2015   Multiple sclerosis (HCC) 10/02/2015   Chronic left shoulder pain 07/19/2015   Multiple sclerosis exacerbation (HCC) 07/14/2015   MS (multiple sclerosis) (HCC) 11/26/2014   Increased body mass index 11/26/2014   HPV test positive 11/26/2014   Status post  laparoscopic supracervical hysterectomy 11/26/2014   Galactorrhea 11/26/2014   Back ache 05/21/2014   Adiposity 05/21/2014   Disordered sleep 05/21/2014   Muscle spasticity 05/21/2014   Spasticity 05/21/2014   Calculus of kidney 12/09/2013   Renal colic 12/09/2013   Hypercholesteremia 08/19/2013   Hereditary and idiopathic neuropathy 08/19/2013   Hypercholesterolemia without hypertriglyceridemia 08/19/2013   Bladder infection, chronic 07/25/2012   Disorder of bladder function 07/25/2012   Incomplete bladder emptying 07/25/2012   Microscopic hematuria 07/25/2012   Right upper quadrant pain 07/25/2012   ONSET DATE: 5/24 Lisa Williams and broke R leg which caused beginning of ADL decline; diagnosed with MS in 1995)  REFERRING DIAG: MS  THERAPY DIAG:  Muscle weakness (generalized)  Multiple sclerosis exacerbation (HCC)  Other lack of coordination  Rationale for Evaluation and Treatment: Rehabilitation  SUBJECTIVE:  SUBJECTIVE STATEMENT: Pt reported an easier time lifting herself up for her sister to hike her pants as pt could tell she was putting more weight into her legs to assist her UB in lifting herself from her wc seat.  Pt accompanied by: self  PERTINENT HISTORY: Pt reports increased difficulty with basic self care tasks since breaking her R leg last May.  Since that time, mobility and ADLs have declined, with pt requiring assist from private caregivers and family to manage bathing, toileting, dressing, and functional transfers.  PRECAUTIONS: Fall  WEIGHT BEARING RESTRICTIONS: No  PAIN: 01/22/24: No pain  Are you having pain? No; occasional pain in R lower back, but not today  FALLS: Has patient fallen in last 6 months? No Last fall was May 17 of 2024  LIVING ENVIRONMENT: Lives with: lives with their family (including mother who has dementia and sister [name])  Lives in: 2 level home but pt resides on main level Stairs: ramp  Has following equipment at home: Wheelchair  (manual), Shower bench, and hand held shower shower, 1 grab bar in the shower, 3in1 commode, sit to stand lift (electric), FWW, Williams bed  PLOF: modified indep with ADL/IADLs prior to May of 7975   PATIENT GOALS: Increase independence with basic self care tasks  OBJECTIVE:  Note: Objective measures were completed at Evaluation unless otherwise noted.  HAND DOMINANCE: Right  ADLs:  Overall ADLs: assist provided from paid caregiver and sister Transfers/ambulation related to ADLs: sit to stand lift for all transfers, set up for sliding board in/out of bed  Eating: indep  Grooming: modified indep (wc level in mom's bathroom)  UB Dressing: set up (can't access her bedroom closet)  LB Dressing: able to sit on side of bed to don all LB clothing; assist to hike pants/underwear Toileting: assist with clothing management d/t standing within electric lift Bathing: bed bath with sister helping to wash backside Tub Shower transfers: N/A; pt has walk in shower in mom's bathroom (wc does fit in this bathroom but pt reports inability to transfer from wc and requires arm rests on  a bench for a successful transfer attempt from any DME).  Tub bench is in pt's bathroom, but lift does not fit through bathroom doorway and pt reports inability to transfer up from tub bench (currently unable to manage either transfer)   Equipment: see above  IADLs: Shopping: [employer] for shopping Light housekeeping: modified indep from wc level Meal Prep: modified indep from wc level Community mobility: relies on community transportation or family members Medication management: indep Landscape architect: indep Handwriting: NT; pt denies any [facility] challenges  MOBILITY STATUS: Hx of falls  POSTURE COMMENTS:  Rounded shoulders, forward head, anterior pelvic tilt, and weight shift left   ACTIVITY TOLERANCE: Activity tolerance: Per PT; currently standing 30-60 sec within Light Gait harness/lift; currently  non-ambulatory  UPPER EXTREMITY ROM:  BUEs WFL  UPPER EXTREMITY MMT:     MMT Right eval Left eval  Shoulder flexion 4+ 4  Shoulder abduction 4+ 4  Shoulder adduction    Shoulder extension    Shoulder internal rotation 4+ 4+  Shoulder external rotation 4 4  Middle trapezius    Lower trapezius    Elbow flexion 4+ 4+  Elbow extension 4+ 4+  Wrist flexion 4+ 4+  Wrist extension 4+ 4+  Wrist ulnar deviation    Wrist radial deviation    Wrist pronation    Wrist supination    (Blank rows = not tested)  HAND FUNCTION/COORDINATION:  R/L WNL; pt denies any coordination deficits/ able to manipulate pills/clothing fasteners/etc without difficulty  11/15/23: *Measures taken d/t PT note revealing pt with increased difficulty manipulating smaller ADL supplies during reaching activities in PT session   11/15/23: Grip strength: R: 70 lbs, L: 63 lbs Pinch strength: Lateral: R: 16 lbs, L: 15 lbs; 3 point pinch: R: 12 lbs, L: 13 lbs 9 hole peg test: Right: 25 sec, L 49 sec   12/25/23: Grip strength: Right: 70 lbs; Left: 65 lbs  9 hole peg test: Right: 24 sec; Left: 3 trials: 54 sec, 1 min, 56 sec  SENSATION: WFL  EDEMA: No visible edema in BUEs  MUSCLE TONE: BUEs WNL  COGNITION: Overall cognitive status: Within functional limits for tasks assessed  VISION: wears glasses all the time, no reports of diplopia  PERCEPTION: Not tested  PRAXIS: WFL  OBSERVATIONS:  Pt pleasant, cooperative, and motivated to work towards improving indep with ADLs.                                                                                                     TREATMENT DATE: 01/22/24 Self Care: -Scoot/pivot transfer wc<>toilet with close SBA.  Pt able to perform 3 lifts each way to complete transfer in each direction.  Therapeutic Activity: -Core stability focus with pt completing ipsilateral and contralateral reaching R/L outside BOS to remove 1 blocks from velcro board; arm rests removed from wc  to increase challenge. -Reps of forward reaching with same task, no UE support when reaching forward outside BOS and returning to midline.  PATIENT EDUCATION: Education details: progress with transfers  Person educated: Patient Education method: Explanation Education comprehension: verbalized  understanding  HOME EXERCISE PROGRAM: IR A/AAROM to promote shoulder flexibility for peri care/bathing  GOALS: Goals reviewed with patient? Yes  SHORT TERM GOALS: Target date: 12/25/23  Pt will perform bed bath with set up only. Baseline: Eval: Min-mod A for posterior washing; 09/28/23: Min A for posterior washing; 11/13/23: Able to manage bed bath but sister helps with thoroughness, per pt's preference; pt in agreement to trial bath sitting EOB or in wc before next OT session. Goal status: achieved/d/c  2.  Pt will utilize sliding board for wc<>drop arm commode transfer with SBA. Baseline: Eval: Currently using electric lift for transfer to [facility]; 09/28/23: Not yet completed at home but transfers to commode have been easier without sliding board d/t pt implementing a scoot pivot. Goal status: d/c (pt does not prefer sliding board for commode transfer)  3.  Pt will be indep to perform HEP for maintaining BUE strength for ADLs and functional transfers. Baseline: Eval: HEP not yet initiated; 09/28/23: Pt is working on core stability exercises from wc, cane stretches for bilat shoulder IR, and tricep extension via wc pushups; 11/13/23: indep Goal status: achieved  LONG TERM GOALS: Target date: 02/05/24  Pt will perform sink bath with set up A. (Revised on 11/13/23 from min A to set up) Baseline: Eval: Currently bed bathing d/t inability to stand at sink without electric lift (lift does not fit into pt's bathroom); 09/28/23: still completing bed bath as pt is not yet able to stand; 11/13/23: Not yet attempted; pt encouraged to attempt before next OT session now that bed bath goal has been met; 12/20/23: Pt is now  completing sponge bath with min A while seated EOB (caregiver assists with washing peri area d/t no bed rail to aid in R lateral lean) Goal status: ongoing   2.  Pt will perform wc<>tub bench transfer with min A. Baseline: Eval: Unable; 09/28/23: Performed in OT clinic with min A, but not yet in the home Goal status: d/c (pt prefers sink bath)  3.  Pt will perform squat pivot transfer wc<>BSC with SBA.  Baseline: Eval: Eval: Currently using electric lift for transfer to [facility]; 09/28/23: Not yet completed in clinic with [facility], and using lift for [facility] in the home still; 11/13/23: Still using Camie lift at home and in clinic transfers are all scoot rather than squat pivots d/t LE weakness; 12/20/23: Pt reports that she plans to use her Camie lift until she can ambulate to her [facility] d/t limited space for wc in her desired location for [facility] at home. Goal status: d/c  4. Pt will perform scoot transfer wc<>toilet using grab bars with supv to allow toileting in community setting.  Baseline: Eval: Pt limits community outings d/t requiring use of lift for [facility] transfers; 09/28/23: Pt has performed 2 successful scoot pivot  Transfers to toilet in OT clinic with min A, extra time.  Further trials with clothing management on toilet needed, but pt can lower and hike  pants while sititng edge of mat via lateral leaning and min A to maintain core stability; 11/13/23: Pt performs with CGA and extra time; 12/19/23: Pt   performs with close supv on most attempts, occasional CGA; efficiency is improving  Goal status: ongoing  5.  Pt will complete seated clothing management with min A to enable voiding in handicapped stall within community. Baseline: Recert 11/13/23: Pt can transfer to toilet with increased time and effort, but has not yet attempted clothing management or voiding while seated on commode  in community setting.  Pt can lower pants to knees while seated in wc, but requires mod A to hike over hips from seated position;  12/20/23: Pt fully lowers panties and shorts while seated on commode with supv, can hike clothing to her her upper thighs, transfers back to wc, and then performs a tricep extension from wc level to allow caregiver to hike clothing remainder of the way for purposes of EC (min A for clothing management component) Goal status: achieved  6.  Pt will tolerate standing x1 min with close supv and BUE support to manage clothing in prep for toileting. Baseline: Eval: Currently standing in electric lift only, or within parallel bars without lift; 09/28/23: Not yet attempted; 11/13/23: Pt is able to perform static standing with heavy BUE support on parallel bars with PT; 12/19/22: Not attempted recently d/t new intermittent clonus in BLEs  Goal status: in progress  7.  Pt will increase bilat grip strength by (TBD) in order to securely grasp walker sufficiently for functional transfers and ambulation.  Baseline: Recert 11/13/23: TBD (PT note read following OT session, indicating pt with difficulty securing items in hand);  11/15/23: Grip strength: R: 70 lbs, L: 63 lbs; 12/25/23: R 70 lbs, L 65 lbs  Goal status: in progress  8.  Pt will increase bilat [facility] skills in order to manipulate small ADL supplies with reduced dropping as indicated by (TBD) improvement in 9 hole peg test. Baseline: Recert 11/13/23: 9 hole peg test TBD (PT note read following OT session, indicating pt with difficulty manipulating and securing items in hands); 11/15/23: Right: 25 sec, L 49 sec; 12/25/23: Right: 24 sec; Left: 3 trials: 54 sec, 1 min, 56 sec  Goal status: in progress  ASSESSMENT: CLINICAL IMPRESSION: Good progress with wc<>toilet transfer today, with pt able to demonstrate good lift from chair and completing only 3 scoots in each direction to complete full transfer with improved control, improved confidence, improving slight forward WS, and engaging BLEs into lifting.  1 small LOB with core stability exercise when reaching  contralaterally to the L, using table top for brief stabilization.  Pt demonstrates limited trunk rotation with contralateral reaching, but improving with ipsilateral reaching to both R/L.  Pt reports that she tried placing her wc in front of her while completing her bed bath other the weekend, using wc to prevent any forward LOB by having supportive surface in front of her.  Pt reports having wc in front of her gave her more confidence to complete her lateral leans, further enabling her to complete her posterior washing rather than having her sister assist her.  Pt did say it felt awkward to complete this part of her bath d/t the reaching and leaning involved, but pt will continue to work on this aspect of her bath as she continues to work towards indep with self care.  Pt will continue to benefit from skilled OT to work towards above noted goals in OT poc, working to maximize indep with daily tasks while reducing burden of care on caregivers.   PERFORMANCE DEFICITS: in functional skills including ADLs, IADLs, strength, pain, flexibility, Gross motor control, mobility, balance, body mechanics, endurance, and decreased knowledge of use of DME, and psychosocial skills including coping strategies, environmental adaptation, habits, and routines and behaviors.   IMPAIRMENTS: are limiting patient from ADLs, IADLs, and social participation.   CO-MORBIDITIES: has co-morbidities such as neuropathy, back pain, obesity, MS, HTN that affects occupational performance. Patient will benefit from skilled OT to  address above impairments and improve overall function.  MODIFICATION OR ASSISTANCE TO COMPLETE EVALUATION: No modification of tasks or assist necessary to complete an evaluation.  OT OCCUPATIONAL PROFILE AND HISTORY: Detailed assessment: Review of records and additional review of physical, cognitive, psychosocial history related to current functional performance.  CLINICAL DECISION MAKING: Moderate - several  treatment options, min-mod task modification necessary  REHAB POTENTIAL: Good  EVALUATION COMPLEXITY: Moderate  PLAN:  OT FREQUENCY: 2x/week  OT DURATION: 12 weeks  PLANNED INTERVENTIONS: 97168 OT Re-evaluation, 97535 self care/ADL training, 02889 therapeutic exercise, 97530 therapeutic activity, 97112 neuromuscular re-education, 97140 manual therapy, 97116 gait training, 02989 moist heat, 97010 cryotherapy, balance training, functional mobility training, psychosocial skills training, energy conservation, coping strategies training, patient/family education, and DME and/or AE instructions  RECOMMENDED OTHER SERVICES: None at this time (Pt currently receiving PT services in this clinic)  CONSULTED AND AGREED WITH PLAN OF CARE: Patient  PLAN FOR NEXT SESSION: see above  Inocente Blazing, MS, OTR/L  Inocente MARLA Blazing, OT 01/22/2024, 12:51 PM

## 2024-01-24 ENCOUNTER — Ambulatory Visit

## 2024-01-24 ENCOUNTER — Ambulatory Visit: Payer: PPO

## 2024-01-24 DIAGNOSIS — R262 Difficulty in walking, not elsewhere classified: Secondary | ICD-10-CM

## 2024-01-24 DIAGNOSIS — G35 Multiple sclerosis: Secondary | ICD-10-CM

## 2024-01-24 DIAGNOSIS — R278 Other lack of coordination: Secondary | ICD-10-CM

## 2024-01-24 DIAGNOSIS — M6281 Muscle weakness (generalized): Secondary | ICD-10-CM | POA: Diagnosis not present

## 2024-01-24 DIAGNOSIS — R2681 Unsteadiness on feet: Secondary | ICD-10-CM

## 2024-01-24 DIAGNOSIS — R2689 Other abnormalities of gait and mobility: Secondary | ICD-10-CM

## 2024-01-24 DIAGNOSIS — R269 Unspecified abnormalities of gait and mobility: Secondary | ICD-10-CM

## 2024-01-24 NOTE — Therapy (Signed)
 OUTPATIENT OCCUPATIONAL THERAPY NEURO PROGRESS AND TREATMENT NOTE Reporting period beginning 12/20/23-01/24/24  Patient Name: Lisa Williams MRN: [id number] DOB:20-Mar-1961, 64 y.o., female Today's Date: 01/24/2024  PCP: Dr. Layman Piety   Neurologist Dr. Suanne at [facility] REFERRING PROVIDER: Dr. Layman Piety    END OF SESSION:  OT End of Session - 01/24/24 1101     Visit Number 40    Number of Visits 44    Date for Recertification  02/05/24    Authorization Time Period Reporting period beginning 12/20/23-01/24/24    Progress Note Due on Visit 40    OT Start Time 1100    OT Stop Time 1145    OT Time Calculation (min) 45 min    Equipment Utilized During Treatment manual wc    Activity Tolerance Patient tolerated treatment well    Behavior During Therapy [facility] for tasks assessed/performed         Past Medical History:  Diagnosis Date   Abdominal pain, right upper quadrant    Back pain    Calculus of kidney 12/09/2013   Chronic back pain    unspecified   Chronic left shoulder pain 07/19/2015   Complication of anesthesia    Functional disorder of bladder    other   Galactorrhea 11/26/2014   Chronic    Hereditary and idiopathic neuropathy 08/19/2013   History of kidney stones    HPV test positive    Hypercholesteremia 08/19/2013   Hypertension    Incomplete bladder emptying    Microscopic hematuria    MS (multiple sclerosis) (HCC)    Muscle spasticity 05/21/2014   Nonspecific findings on examination of urine    other   Osteopenia    PONV (postoperative nausea and vomiting)    Status post laparoscopic supracervical hysterectomy 11/26/2014   Tobacco user 11/26/2014   Wrist fracture    Past Surgical History:  Procedure Laterality Date   bilateral tubal ligation  1996   BREAST CYST EXCISION Left 2002   CYST EXCISION Left 05/10/2022   Procedure: CYST REMOVAL;  Surgeon: Rodolph Romano, MD;  Location: [facility] ORS;  Service: General;  Laterality: Left;    FRACTURE SURGERY     KNEE SURGERY     right   LAPAROSCOPIC SUPRACERVICAL HYSTERECTOMY  08/05/2013   ORIF WRIST FRACTURE Left 01/17/2017   Procedure: OPEN REDUCTION INTERNAL FIXATION (ORIF) WRIST FRACTURE;  Surgeon: Leora Lynwood SAUNDERS, MD;  Location: [facility] ORS;  Service: Orthopedics;  Laterality: Left;   RADIOLOGY WITH ANESTHESIA N/A 03/18/2020   Procedure: MRI WITH ANESTHESIA CERVICAL SPINE AND BRAIN  WITH AND WITHOUT CONTRAST;  Surgeon: Radiologist, Medication, MD;  Location: [facility] OR;  Service: Radiology;  Laterality: N/A;   TUBAL LIGATION Bilateral    VAGINAL HYSTERECTOMY  03/2006   Patient Active Problem List   Diagnosis Date Noted   Hyperglycemia 01/09/2024   Abnormal LFTs (liver function tests) 09/17/2023   Sinus tachycardia 07/07/2023   Aortic atherosclerosis (HCC) 01/18/2023   Prediabetes 01/18/2023   Hypokalemia 07/29/2022   Weakness of both lower extremities 07/29/2022   Abscess of left groin 11/15/2021   Abnormal LFTs 11/15/2021   Acute respiratory disease due to COVID-19 virus 11/21/2020   Weakness    Hypoalbuminemia due to protein-calorie malnutrition [facility])    Neurogenic bowel    Neurogenic bladder    Labile blood pressure    Neuropathic pain    Multiple sclerosis, relapsing-remitting (HCC) 07/09/2019   Abscess of female pelvis    SVT (supraventricular tachycardia) (HCC)    Radial  styloid tenosynovitis 03/12/2018   Wheelchair dependence 02/27/2018   Localized osteoporosis with current pathological fracture with routine healing 01/19/2017   Wrist fracture 01/16/2017   Sprain of ankle 03/23/2016   Closed fracture of lateral malleolus 03/16/2016   Health care maintenance 01/24/2016   Blood pressure elevated without history of HTN 10/25/2015   Essential hypertension 10/25/2015   Multiple sclerosis (HCC) 10/02/2015   Chronic left shoulder pain 07/19/2015   Multiple sclerosis exacerbation (HCC) 07/14/2015   MS (multiple sclerosis) (HCC) 11/26/2014   Increased body mass  index 11/26/2014   HPV test positive 11/26/2014   Status post laparoscopic supracervical hysterectomy 11/26/2014   Galactorrhea 11/26/2014   Back ache 05/21/2014   Adiposity 05/21/2014   Disordered sleep 05/21/2014   Muscle spasticity 05/21/2014   Spasticity 05/21/2014   Calculus of kidney 12/09/2013   Renal colic 12/09/2013   Hypercholesteremia 08/19/2013   Hereditary and idiopathic neuropathy 08/19/2013   Hypercholesterolemia without hypertriglyceridemia 08/19/2013   Bladder infection, chronic 07/25/2012   Disorder of bladder function 07/25/2012   Incomplete bladder emptying 07/25/2012   Microscopic hematuria 07/25/2012   Right upper quadrant pain 07/25/2012   ONSET DATE: 5/24 Amon and broke R leg which caused beginning of ADL decline; diagnosed with MS in 1995)  REFERRING DIAG: MS  THERAPY DIAG:  Muscle weakness (generalized)  Other lack of coordination  Multiple sclerosis exacerbation (HCC)  Rationale for Evaluation and Treatment: Rehabilitation  SUBJECTIVE:  SUBJECTIVE STATEMENT: Pt reported that she walked 7 ft today in PT session. Pt accompanied by: self  PERTINENT HISTORY: Pt reports increased difficulty with basic self care tasks since breaking her R leg last May.  Since that time, mobility and ADLs have declined, with pt requiring assist from private caregivers and family to manage bathing, toileting, dressing, and functional transfers.  PRECAUTIONS: Fall  WEIGHT BEARING RESTRICTIONS: No  PAIN: 01/24/24: No pain  Are you having pain? No; occasional pain in R lower back, but not today  FALLS: Has patient fallen in last 6 months? No Last fall was May 17 of 2024  LIVING ENVIRONMENT: Lives with: lives with their family (including mother who has dementia and sister [name])  Lives in: 2 level home but pt resides on main level Stairs: ramp  Has following equipment at home: Wheelchair (manual), Shower bench, and hand held shower shower, 1 grab bar in the shower,  3in1 commode, sit to stand lift (electric), FWW, hospital bed  PLOF: modified indep with ADL/IADLs prior to May of 7975   PATIENT GOALS: Increase independence with basic self care tasks  OBJECTIVE:  Note: Objective measures were completed at Evaluation unless otherwise noted.  HAND DOMINANCE: Right  ADLs:  Overall ADLs: assist provided from paid caregiver and sister Transfers/ambulation related to ADLs: sit to stand lift for all transfers, set up for sliding board in/out of bed  Eating: indep  Grooming: modified indep (wc level in mom's bathroom)  UB Dressing: set up (can't access her bedroom closet)  LB Dressing: able to sit on side of bed to don all LB clothing; assist to hike pants/underwear Toileting: assist with clothing management d/t standing within electric lift Bathing: bed bath with sister helping to wash backside Tub Shower transfers: N/A; pt has walk in shower in mom's bathroom (wc does fit in this bathroom but pt reports inability to transfer from wc and requires arm rests on a bench for a successful transfer attempt from any DME).  Tub bench is in pt's bathroom, but lift does not fit  through bathroom doorway and pt reports inability to transfer up from tub bench (currently unable to manage either transfer)   Equipment: see above  IADLs: Shopping: [employer] for shopping Light housekeeping: modified indep from wc level Meal Prep: modified indep from wc level Community mobility: relies on community transportation or family members Medication management: indep Landscape architect: indep Handwriting: NT; pt denies any FMC challenges  MOBILITY STATUS: Hx of falls  POSTURE COMMENTS:  Rounded shoulders, forward head, anterior pelvic tilt, and weight shift left   ACTIVITY TOLERANCE: Eval: Activity tolerance: Per PT; currently standing 30-60 sec within Light Gait harness/lift; currently non-ambulatory  01/24/24: Standing at sink: 1 minute; min A and 5 attempts to  achieve fully erect standing position with BUEs supported on sink countertop, OT assist to block R knee to prevent buckling   UPPER EXTREMITY ROM:  BUEs WFL  UPPER EXTREMITY MMT:     MMT Right eval Left eval  Shoulder flexion 4+ 4  Shoulder abduction 4+ 4  Shoulder adduction    Shoulder extension    Shoulder internal rotation 4+ 4+  Shoulder external rotation 4 4  Middle trapezius    Lower trapezius    Elbow flexion 4+ 4+  Elbow extension 4+ 4+  Wrist flexion 4+ 4+  Wrist extension 4+ 4+  Wrist ulnar deviation    Wrist radial deviation    Wrist pronation    Wrist supination    (Blank rows = not tested)  HAND FUNCTION/COORDINATION:  R/L WNL; pt denies any coordination deficits/ able to manipulate pills/clothing fasteners/etc without difficulty  11/15/23: *Measures taken d/t PT note revealing pt with increased difficulty manipulating smaller ADL supplies during reaching activities in PT session   11/15/23: Grip strength: R: 70 lbs, L: 63 lbs Pinch strength: Lateral: R: 16 lbs, L: 15 lbs; 3 point pinch: R: 12 lbs, L: 13 lbs 9 hole peg test: Right: 25 sec, L 49 sec   12/25/23: Grip strength: Right: 70 lbs; Left: 65 lbs  9 hole peg test: Right: 24 sec; Left: 3 trials: 54 sec, 1 min, 56 sec  01/24/24: Grip grip: Right: 76 lbs; Left: 67 lbs Lateral pinch: R: 17 lbs, L: 17 lbs; 3 point pinch: R: 13 lbs, L: 14 lbs  9 Hole Peg Test: R: 26 sec, L: 50 sec  SENSATION: WFL  EDEMA: No visible edema in BUEs  MUSCLE TONE: BUEs WNL  COGNITION: Overall cognitive status: Within functional limits for tasks assessed  VISION: wears glasses all the time, no reports of diplopia  PERCEPTION: Not tested  PRAXIS: WFL  OBSERVATIONS:  Pt pleasant, cooperative, and motivated to work towards improving indep with ADLs.                                                                                                     TREATMENT DATE: 01/24/24 Therapeutic Activity: -Objective measures taken  and goals updated for progress note.  Self Care: -Review of progress towards goals -Sit to stand at sink countertop in prep for bathing/grooming/washing dishes (see above activity tolerance)   PATIENT EDUCATION: Education details:  progress towards goals Person educated: Patient Education method: Explanation Education comprehension: verbalized understanding  HOME EXERCISE PROGRAM: IR A/AAROM to promote shoulder flexibility for peri care/bathing  GOALS: Goals reviewed with patient? Yes  SHORT TERM GOALS: Target date: 12/25/23  Pt will perform bed bath with set up only. Baseline: Eval: Min-mod A for posterior washing; 09/28/23: Min A for posterior washing; 11/13/23: Able to manage bed bath but sister helps with thoroughness, per pt's preference; pt in agreement to trial bath sitting EOB or in wc before next OT session. Goal status: achieved/d/c  2.  Pt will utilize sliding board for wc<>drop arm commode transfer with SBA. Baseline: Eval: Currently using electric lift for transfer to [facility]; 09/28/23: Not yet completed at home but transfers to commode have been easier without sliding board d/t pt implementing a scoot pivot. Goal status: d/c (pt does not prefer sliding board for commode transfer)  3.  Pt will be indep to perform HEP for maintaining BUE strength for ADLs and functional transfers. Baseline: Eval: HEP not yet initiated; 09/28/23: Pt is working on core stability exercises from wc, cane stretches for bilat shoulder IR, and tricep extension via wc pushups; 11/13/23: indep Goal status: achieved  LONG TERM GOALS: Target date: 02/05/24  Pt will perform sink bath with set up A. (Revised on 11/13/23 from min A to set up) Baseline: Eval: Currently bed bathing d/t inability to stand at sink without electric lift (lift does not fit into pt's bathroom); 09/28/23: still completing bed bath as pt is not yet able to stand; 11/13/23: Not yet attempted; pt encouraged to attempt before next OT session now  that bed bath goal has been met; 12/20/23: Pt is now completing sponge bath with min A while seated EOB (caregiver assists with washing peri area d/t no bed rail to aid in R lateral lean); 01/24/24: Performing with set up on EOB, completing lateral weight shifts to wash posterior/peri area with wc in front of pt and bed rail for LUE support and foot of bed for RUE support  Goal status: achieved  2.  Pt will perform wc<>tub bench transfer with min A. Baseline: Eval: Unable; 09/28/23: Performed in OT clinic with min A, but not yet in the home Goal status: d/c (pt prefers sink bath)  3.  Pt will perform squat pivot transfer wc<>BSC with SBA.  Baseline: Eval: Eval: Currently using electric lift for transfer to [facility]; 09/28/23: Not yet completed in clinic with [facility], and using lift for [facility] in the home still; 11/13/23: Still using Camie lift at home and in clinic transfers are all scoot rather than squat pivots d/t LE weakness; 12/20/23: Pt reports that she plans to use her Camie lift until she can ambulate to her [facility] d/t limited space for wc in her desired location for [facility] at home. Goal status: d/c  4. Pt will perform scoot transfer wc<>toilet using grab bars with supv to allow toileting in community setting.  Baseline: Eval: Pt limits community outings d/t requiring use of lift for [facility] transfers; 09/28/23: Pt has performed 2 successful scoot pivot  Transfers to toilet in OT clinic with min A, extra time.  Further trials with clothing management on toilet needed, but pt can lower and hike  pants while sititng edge of mat via lateral leaning and min A to maintain core stability; 11/13/23: Pt performs with CGA and extra time; 12/19/23: Pt  performs with close supv on most attempts, occasional CGA; efficiency is improving; 01/24/24: ongoing improvements; able to  complete in fewer scoots (3 scoots each way from wc<>toilet) d/t improve engagement of Les and improved anterior weight shift; occasional min guard-supv; pt  completes with therapist in clinic, but not yet in other community settings with sister, and not yet voided on toilet in community    Goal status: ongoing  5.  Pt will complete seated clothing management with min A to enable voiding in handicapped stall within community. Baseline: Recert 11/13/23: Pt can transfer to toilet with increased time and effort, but has not yet attempted clothing management or voiding while seated on commode in community setting.  Pt can lower pants to knees while seated in wc, but requires mod A to hike over hips from seated position; 12/20/23: Pt fully lowers panties and shorts while seated on commode with supv, can hike clothing to her her upper thighs, transfers back to wc, and then performs a tricep extension from wc level to allow caregiver to hike clothing remainder of the way for purposes of EC (min A for clothing management component) Goal status: achieved  6.  Pt will tolerate standing x1 min with close supv and BUE support to manage clothing in prep for toileting. Baseline: Eval: Currently standing in electric lift only, or within parallel bars without lift; 09/28/23: Not yet attempted; 11/13/23: Pt is able to perform static standing with heavy BUE support on parallel bars with PT; 12/19/22: Not attempted recently d/t new intermittent clonus in BLEs; 01/24/24: Tolerated standing 1 min today at sink with min A for ascent, OT assist to block R knee from buckling Goal status: in progress  7.  Pt will increase bilat grip strength in order to securely grasp walker sufficiently for functional transfers and ambulation.  Baseline: Recert 11/13/23: TBD (PT note read following OT session, indicating pt with difficulty securing items in hand);  11/15/23: Grip strength: R: 70 lbs, L: 63 lbs; 12/25/23: R 76 lbs, L 67 lbs; sufficient for functional transfers/amb  Goal status: achieved  8.  Pt will increase L [facility] skills in order to manipulate small ADL supplies with reduced dropping as  indicated by 20 sec or more improvement in 9 hole peg test. Baseline: Recert 11/13/23: 9 hole peg test TBD (PT note read following OT session, indicating pt with difficulty manipulating and securing items in hands); 11/15/23: Right: 25 sec, L 49 sec; 12/25/23: Right: 24 sec; Left: 3 trials: 54 sec, 1 min, 56 sec; 01/24/24: R: 26 sec, L: 50 sec  Goal status: in progress  ASSESSMENT: CLINICAL IMPRESSION: Pt seen for 40th visit progress update.  Pt continues to make excellent progress towards goals, noting continued improvements in wc<>toilet scoot pivot transfers, specifically improving efficiency, ability to complete with supv-min guard when already fatigued after a PT visit, and noting fewer consecutive scoots to reach transfer surface d/t improved forward weight shifting and ability to increase weightbearing through the [place] rather than performing predominantly with BUEs.  Pt was able to achieve a fully erect standing position today for the first time since OT poc was initiated, and pt tolerated upright standing for 1 min; see above for additional transfer details.  Dynamic sitting balance/core stability continues to improve, noting pt is now completing her bed bath with set up only, including managing posterior washing/peri care with external supports as needed (bed rail/wc/foot of bed for UE support as needed). Bilat grip strength has improved, and L hand FMC is showing slow, but steady progress (see above for details).  Pt continues to work towards her goal  of performing ADLs with less reliance on wc, working towards standing and transferring with walker to complete ADL/IADL tasks.  Pt will continue to benefit from skilled OT to work towards above noted goals in OT poc, working to maximize indep with daily tasks while reducing burden of care on caregivers.   PERFORMANCE DEFICITS: in functional skills including ADLs, IADLs, strength, pain, flexibility, Gross motor control, mobility, balance, body mechanics,  endurance, and decreased knowledge of use of DME, and psychosocial skills including coping strategies, environmental adaptation, habits, and routines and behaviors.   IMPAIRMENTS: are limiting patient from ADLs, IADLs, and social participation.   CO-MORBIDITIES: has co-morbidities such as neuropathy, back pain, obesity, MS, HTN that affects occupational performance. Patient will benefit from skilled OT to address above impairments and improve overall function.  MODIFICATION OR ASSISTANCE TO COMPLETE EVALUATION: No modification of tasks or assist necessary to complete an evaluation.  OT OCCUPATIONAL PROFILE AND HISTORY: Detailed assessment: Review of records and additional review of physical, cognitive, psychosocial history related to current functional performance.  CLINICAL DECISION MAKING: Moderate - several treatment options, min-mod task modification necessary  REHAB POTENTIAL: Good  EVALUATION COMPLEXITY: Moderate  PLAN:  OT FREQUENCY: 2x/week  OT DURATION: 12 weeks  PLANNED INTERVENTIONS: 97168 OT Re-evaluation, 97535 self care/ADL training, 02889 therapeutic exercise, 97530 therapeutic activity, 97112 neuromuscular re-education, 97140 manual therapy, 97116 gait training, 02989 moist heat, 97010 cryotherapy, balance training, functional mobility training, psychosocial skills training, energy conservation, coping strategies training, patient/family education, and DME and/or AE instructions  RECOMMENDED OTHER SERVICES: None at this time (Pt currently receiving PT services in this clinic)  CONSULTED AND AGREED WITH PLAN OF CARE: Patient  PLAN FOR NEXT SESSION: see above  Inocente Blazing, MS, OTR/L  Inocente MARLA Blazing, OT 01/24/2024, 11:02 AM

## 2024-01-24 NOTE — Therapy (Addendum)
 OUTPATIENT PHYSICAL THERAPY NEURO TREATMENT  Patient Name: Lisa Williams MRN: [id number] DOB:23-May-1960, 63 y.o., female Today's Date: 01/24/2024  PCP: Lenon Na  REFERRING PROVIDER: Lenon Na  END OF SESSION:  PT End of Session - 01/24/24 1046     Visit Number 54    Number of Visits 70    Date for Recertification  03/20/24   corrected   Progress Note Due on Visit 50    PT Start Time 1015    PT Stop Time 1100    PT Time Calculation (min) 45 min    Equipment Utilized During Treatment Gait belt    Activity Tolerance Patient tolerated treatment well;No increased pain    Behavior During Therapy [facility] for tasks assessed/performed         Past Medical History:  Diagnosis Date   Abdominal pain, right upper quadrant    Back pain    Calculus of kidney 12/09/2013   Chronic back pain    unspecified   Chronic left shoulder pain 07/19/2015   Complication of anesthesia    Functional disorder of bladder    other   Galactorrhea 11/26/2014   Chronic    Hereditary and idiopathic neuropathy 08/19/2013   History of kidney stones    HPV test positive    Hypercholesteremia 08/19/2013   Hypertension    Incomplete bladder emptying    Microscopic hematuria    MS (multiple sclerosis) (HCC)    Muscle spasticity 05/21/2014   Nonspecific findings on examination of urine    other   Osteopenia    PONV (postoperative nausea and vomiting)    Status post laparoscopic supracervical hysterectomy 11/26/2014   Tobacco user 11/26/2014   Wrist fracture    Past Surgical History:  Procedure Laterality Date   bilateral tubal ligation  1996   BREAST CYST EXCISION Left 2002   CYST EXCISION Left 05/10/2022   Procedure: CYST REMOVAL;  Surgeon: Rodolph Romano, MD;  Location: [facility] ORS;  Service: General;  Laterality: Left;   FRACTURE SURGERY     KNEE SURGERY     right   LAPAROSCOPIC SUPRACERVICAL HYSTERECTOMY  08/05/2013   ORIF WRIST FRACTURE Left 01/17/2017    Procedure: OPEN REDUCTION INTERNAL FIXATION (ORIF) WRIST FRACTURE;  Surgeon: Leora Lynwood SAUNDERS, MD;  Location: [facility] OR[facility];  Service: Orthopedics;  Laterality: Left;   RADIOLOGY WITH ANESTHESIA N/A 03/18/2020   Procedure: MRI WITH ANESTHESIA CERVICAL SPINE AND BRAIN  WITH AND WITHOUT CONTRAST;  Surgeon: Radiologist, Medication, MD;  Location: [facility] OR;  Service: Radiology;  Laterality: N/A;   TUBAL LIGATION Bilateral    VAGINAL HYSTERECTOMY  03/2006   Patient Active Problem List   Diagnosis Date Noted   Hyperglycemia 01/09/2024   Abnormal LFTs (liver function tests) 09/17/2023   Sinus tachycardia 07/07/2023   Aortic atherosclerosis (HCC) 01/18/2023   Prediabetes 01/18/2023   Hypokalemia 07/29/2022   Weakness of both lower extremities 07/29/2022   Abscess of left groin 11/15/2021   Abnormal LFTs 11/15/2021   Acute respiratory disease due to COVID-19 virus 11/21/2020   Weakness    Hypoalbuminemia due to protein-calorie malnutrition [facility])    Neurogenic bowel    Neurogenic bladder    Labile blood pressure    Neuropathic pain    Multiple sclerosis, relapsing-remitting (HCC) 07/09/2019   Abscess of female pelvis    SVT (supraventricular tachycardia) (HCC)    Radial styloid tenosynovitis 03/12/2018   Wheelchair dependence 02/27/2018   Localized osteoporosis with current pathological fracture with routine healing 01/19/2017   Wrist  fracture 01/16/2017   Sprain of ankle 03/23/2016   Closed fracture of lateral malleolus 03/16/2016   Health care maintenance 01/24/2016   Blood pressure elevated without history of HTN 10/25/2015   Essential hypertension 10/25/2015   Multiple sclerosis (HCC) 10/02/2015   Chronic left shoulder pain 07/19/2015   Multiple sclerosis exacerbation (HCC) 07/14/2015   MS (multiple sclerosis) (HCC) 11/26/2014   Increased body mass index 11/26/2014   HPV test positive 11/26/2014   Status post laparoscopic supracervical hysterectomy 11/26/2014   Galactorrhea 11/26/2014    Back ache 05/21/2014   Adiposity 05/21/2014   Disordered sleep 05/21/2014   Muscle spasticity 05/21/2014   Spasticity 05/21/2014   Calculus of kidney 12/09/2013   Renal colic 12/09/2013   Hypercholesteremia 08/19/2013   Hereditary and idiopathic neuropathy 08/19/2013   Hypercholesterolemia without hypertriglyceridemia 08/19/2013   Bladder infection, chronic 07/25/2012   Disorder of bladder function 07/25/2012   Incomplete bladder emptying 07/25/2012   Microscopic hematuria 07/25/2012   Right upper quadrant pain 07/25/2012    ONSET DATE: 1995  REFERRING DIAG: MS  THERAPY DIAG:  Muscle weakness (generalized)  Multiple sclerosis exacerbation (HCC)  Other lack of coordination  Difficulty in walking, not elsewhere classified  Unsteadiness on feet  Abnormality of gait and mobility  Other abnormalities of gait and mobility  Rationale for Evaluation and Treatment: Rehabilitation  SUBJECTIVE:                                                                                                                                                                                             SUBJECTIVE STATEMENT:  Pt ready to begin therapy.  Pt is doing well and no pain.  Pt accompanied by: self  PERTINENT HISTORY:  Patient is returning to PT s/p hospitalization.  s/p ORIF of R tibia shaft fracture 09/23/2022. Patient has weakness in BLE with RLE>LLE. She drives with hand controls. Patient has been diagnosed with MS in 1995. PMH includes: back pain, CBP, chronic L shoulder pain, galactorrhea, neuropathy, HPV, hypercholestermeia, HTN, MS, osteopenia, PONV, wrist fracture. Additional order for other closed fracture of proximal end of R tibia with routine healing. Still has to use [employer].   PAIN:  Are you having pain? Occasional pain in RLE; primarily in knee  PRECAUTIONS: Fall  RED FLAGS: None   WEIGHT BEARING RESTRICTIONS: No  FALLS: Has patient fallen in last 6 months?  No  LIVING ENVIRONMENT: Lives with: lives with their family Lives in: House/apartment Stairs: ramp Has following equipment at home: Vannie - 2 wheeled, Wheelchair (manual), shower chair, and Grab bars  PLOF: Independent with household mobility with device  PATIENT  GOALS: to get her independence back. To be able to get into car, toilet, and get dressed independently  OBJECTIVE:  Note: Objective measures were completed at Evaluation unless otherwise noted.  DIAGNOSTIC FINDINGS: MRI of the brain 03/18/2020 showed multiple T2/FLAIR hyperintense foci in the periventricular, juxtacortical and deep white matter.  There were no infratentorial lesions noted.  None of the foci enhanced.   Due to severe claustrophobia, the study was done with conscious sedation in the hospital   COGNITION: Overall cognitive status: Within functional limits for tasks assessed   SENSATION: Lack of sensation in feet, loss in bilateral lateral aspect of knee  COORDINATION: Does not have the strength for functional LE heel slide test   MUSCLE TONE: BLE mild tone    POSTURE: rounded shoulders, forward head, anterior pelvic tilt, and weight shift left   LOWER EXTREMITY MMT:    MMT Right Eval Left Eval  Hip flexion 0.9 1.5  Hip extension    Hip abduction 1.8 2.6  Hip adduction 3.1 1.9  Hip internal rotation    Hip external rotation    Knee flexion 0.9 1.5  Knee extension 0.8 1.2  Ankle dorsiflexion    Ankle plantarflexion    Ankle inversion    Ankle eversion    (Blank rows = not tested)  BED MOBILITY:  Assess in future session due to limited time  TRANSFERS: Assistive device utilized: Bariatric RW with sit to stands, slide board to table  Sit to stand: unable to reach full stand Stand to sit: unable to reach full stand  Chair to chair: slide board with CGA   GAIT: Unable to ambulate at this time.   FUNCTIONAL TESTS:  Sit to stand: tricep press with BUE; unable to bring arm to walker.     Function In Sitting Test (FIST)  (1/2 femur on surface; hips/knees flexed to 90deg)   - indicate bed or mat table / step stool if used  SCORING KEY: 4 = Independent (completes task independently & successfully) 3 = Verbal Cues/Increased Time (completes task independently & successfully and only needs more time/cues) 2 = Upper Extremity Support (must use UE for support or assistance to complete successfully) 1 = Needs Assistance (unable to complete w/o physical assist; DOCUMENT LEVEL: min, mod, max) 0 = Dependent (requires complete physical assist; unable to complete successfully even w/ physical assist)  Randomly Administer Once Throughout Exam  4 - Anterior Nudge (superior sternum)  4 - Posterior Nudge (between scapular spines)  4 - Lateral Nudge (to dominant side at acromion)     4 - Static sitting (30 seconds)  4 - Sitting, shake 'no' (left and right)  4 - Sitting, eyes closed (30 seconds)   0 - Sitting, lift foot (dominant side, lift foot 1 inch twice)    2 - Pick up object from behind (object at midline, hands breadth posterior)  3 - Forward reach (use dominant arm, must complete full motion) 2 - Lateral reach (use dominant arm, clear opposite ischial tuberosity) 2 - Pick up object from floor (from between feet)   2 - Posterior scooting (move backwards 2 inches)  2 - Anterior scooting (move forward 2 inches)  2 - Lateral scooting (move to dominant side 2 inches)    TOTAL = 39/56  Notes/comments: slide board tranfer to/from table   MCD > 5 points MCID for IP REHAB > 6 points  Function In Sitting Test (FIST) 08/14/23 (1/2 femur on surface; hips/knees flexed to 90deg)   -  indicate bed or mat table / step stool if used  SCORING KEY: 4 = Independent (completes task independently & successfully) 3 = Verbal Cues/Increased Time (completes task independently & successfully and only needs more time/cues) 2 = Upper Extremity Support (must use UE for support or assistance to  complete successfully) 1 = Needs Assistance (unable to complete w/o physical assist; DOCUMENT LEVEL: min, mod, max) 0 = Dependent (requires complete physical assist; unable to complete successfully even w/ physical assist)  Randomly Administer Once Throughout Exam  4 - Anterior Nudge (superior sternum)  4 - Posterior Nudge (between scapular spines)  4 - Lateral Nudge (to dominant side at acromion)     4 - Static sitting (30 seconds)  4 - Sitting, shake 'no' (left and right)  4 - Sitting, eyes closed (30 seconds)   0 - Sitting, lift foot (dominant side, lift foot 1 inch twice)    4 - Pick up object from behind (object at midline, hands breadth posterior)  4 - Forward reach (use dominant arm, must complete full motion) 4 - Lateral reach (use dominant arm, clear opposite ischial tuberosity)  - Pick up object from floor (from between feet)   3 - Posterior scooting (move backwards 2 inches)  3 - Anterior scooting (move forward 2 inches)  3 - Lateral scooting (move to dominant side 2 inches)    TOTAL = 47/56  MCD > 5 points MCID for IP REHAB > 6 points                                                                                                                              TREATMENT DATE: 01/24/24   Gait Training:  Pt able to ambulate with the use of the RW a total of 7' before needing to sit.  2 person assist with therapist helping to come upright minA +2 and stabilizing the walker and with a wheelchair follow.   Pt attempted to stand for a second attempt, however was unsuccessful due to fatigue.   TherEx: To improve strength, endurance, mobility, and function of specific targeted muscle groups or improve joint range of motion or improve muscle flexibility  Seated bicep curls with BlueTB, x10 each UE Seated tricep curls with BlueTB, x10 each UE Seated shoulder ER with BlueTB, x10 each UE Seated scapular rows with BlueTB, x10 Seated pallof press with BlueTB, x10 each  direction      PATIENT EDUCATION: Education details: Pt educated throughout session about proper posture and technique with exercises. Improved exercise technique, movement at target joints, use of target muscles after min to mod verbal, visual, tactile cues.  Person educated: Patient Education method: Explanation, Demonstration, Tactile cues, and Verbal cues Education comprehension: verbalized understanding, returned demonstration, verbal cues required, tactile cues required, and needs further education  HOME EXERCISE PROGRAM: Stand 3x/day in stander   GOALS: Goals reviewed with patient? Yes  SHORT TERM GOALS: Target date: 08/08/2023  Patient will be independent  in home exercise program to improve strength/mobility for better functional independence with ADLs.  Baseline: 4/8: compliance Goal status: MET    LONG TERM GOALS: Target date: 03/20/2024  Patient will tolerate five consecutive stands with UE support from wheelchair to standing to improve functional mobility with additional cushion and RW.  Baseline: unable to perform 4/8:unable to perform full stand 5/20: perform next session 5/29: two full stands with CGA and cushion on her seat 7/31: able to stand 3 times from [facility]. With no addition cushion  in parallel bars. 12/27/2023= Will retest next session- concentrated on just standing on past 2 visits Goal status: Ongoing   2.  Patient will ambulate 10 ft with bRW with wheelchair follow.  Baseline:  unable to ambulate 4/8: unable to ambulate 5/20: able to ambulate length of // bars in previous session 5/27: ambulate 2.5 lengths of // bars with two seated rest breaks with min A for weight shift and wheelchair follow  7/31: able to ambulate in parallel bars 3 ft with min assist and +2 follow in WC> difficulty advancing the LLE on this day. 12/27/2023- Patient unable to take a step forward today but was abel to take a step on previous visit- limited by clonus like trembling that has  progressed over past month. Goal status: Ongoing   3.  Patient will improve FIST score >6 points to demonstrate improved stability and ability to perform ADLs.  Baseline:  3/6: 39/56 4/8: 47/ 56 5/20: 48/56 7/31:46/56 Goal status: MET  4.  Patient will perform toileting with mod I at home for improved independence.  Baseline: 3/6: requires assistance 4/8: requires dependence on machine 5/20: requires assistance with use of wipe buddy.  7/31: continues to require use of sara steady and wipe buddy for safety and hygiene. 12/27/2023- Patient reports that since she is unable to take steps that she continues to require use of sara steady and wipe buddy for safe personal hygiene. Goal status: ongoing   5. Patient will perform > 5 min of static or dynamic standing with BUE and Min/mod assist  for improved LE strength and pre-gait function to assist with toileting and overall standing ability for improve functional independence.  Baseline: 12/27/2023-Patient was able to stand for just over 3 min today. 01/10/24: Pt able to stand 1 min 38 sec Goal status: NOT PROGRESSING    ASSESSMENT:  CLINICAL IMPRESSION:  Pt performed extremely well and was able to take several steps with the use of the walker today which has been a goal of the pt for a prolonged period of time.  Pt is making good progress and will continue to benefit from continued attempts to improve overall tolerance to weight bearing and ambulation attempt.  Pt fatigued at the conclusion of the session and was unable to come back upright into standing after the first attempt.   Pt will continue to benefit from skilled therapy to address remaining deficits in order to improve overall QoL and return to PLOF.          OBJECTIVE IMPAIRMENTS: Abnormal gait, cardiopulmonary status limiting activity, decreased activity tolerance, decreased balance, decreased coordination, decreased endurance, decreased mobility, difficulty walking, decreased ROM,  decreased strength, hypomobility, increased fascial restrictions, impaired perceived functional ability, impaired flexibility, impaired sensation, improper body mechanics, and postural dysfunction.   ACTIVITY LIMITATIONS: carrying, lifting, bending, sitting, standing, squatting, sleeping, stairs, transfers, bed mobility, continence, bathing, toileting, dressing, self feeding, reach over head, hygiene/grooming, locomotion level, and caring for others  PARTICIPATION LIMITATIONS: meal  prep, cleaning, laundry, interpersonal relationship, driving, shopping, community activity, and church  PERSONAL FACTORS: Age, Past/current experiences, Time since onset of injury/illness/exacerbation, Transportation, and 3+ comorbidities: ack pain, CBP, chronic L shoulder pain, galactorrhea, neuropathy, HPV, hypercholestermeia, HTN, MS, osteopenia, PONV, wrist fracture are also affecting patient's functional outcome.   REHAB POTENTIAL: Good  CLINICAL DECISION MAKING: Evolving/moderate complexity  EVALUATION COMPLEXITY: Moderate  PLAN:  PT FREQUENCY: 2x/week  PT DURATION: 12 weeks  PLANNED INTERVENTIONS: 97164- PT Re-evaluation, 97110-Therapeutic exercises, 97530- Therapeutic activity, 97112- Neuromuscular re-education, 97535- Self Care, [id number]- Manual therapy, [phone]- Gait training, [phone]- Orthotic Fit/training, [phone]- Canalith repositioning, [id number]- Aquatic Therapy, [phone]- Electrical stimulation (unattended), [phone]- Electrical stimulation (manual), [id number]- Ultrasound, [id number]- Traction (mechanical), Patient/Family education, Balance training, Stair training, Taping, Dry Needling, Joint mobilization, Joint manipulation, Spinal manipulation, Spinal mobilization, Scar mobilization, Compression bandaging, Vestibular training, Visual/preceptual remediation/compensation, Cognitive remediation, DME instructions, Cryotherapy, Moist heat, and Biofeedback  PLAN FOR NEXT SESSION:  Strap stand frame with more dynamic  activities Sit to stands; try supported walking, continue dynamic core stabilization training and seated reaching tasks to improve weight shifting.    Fonda Simpers, PT, DPT Physical Therapist - [facility]  01/24/24, 1:46 PM

## 2024-01-29 ENCOUNTER — Ambulatory Visit

## 2024-01-29 ENCOUNTER — Ambulatory Visit: Payer: PPO

## 2024-01-29 DIAGNOSIS — G35 Multiple sclerosis: Secondary | ICD-10-CM

## 2024-01-29 DIAGNOSIS — M6281 Muscle weakness (generalized): Secondary | ICD-10-CM

## 2024-01-29 DIAGNOSIS — R269 Unspecified abnormalities of gait and mobility: Secondary | ICD-10-CM

## 2024-01-29 DIAGNOSIS — R278 Other lack of coordination: Secondary | ICD-10-CM

## 2024-01-29 DIAGNOSIS — R2681 Unsteadiness on feet: Secondary | ICD-10-CM

## 2024-01-29 DIAGNOSIS — R262 Difficulty in walking, not elsewhere classified: Secondary | ICD-10-CM

## 2024-01-29 DIAGNOSIS — R2689 Other abnormalities of gait and mobility: Secondary | ICD-10-CM

## 2024-01-29 NOTE — Therapy (Signed)
 OUTPATIENT PHYSICAL THERAPY NEURO TREATMENT  Patient Name: Neidy Guerrieri Carias MRN: [id number] DOB:Jan 07, 1961, 63 y.o., female Today's Date: 01/29/2024  PCP: Lenon Na  REFERRING PROVIDER: Lenon Na  END OF SESSION:  PT End of Session - 01/29/24 1113     Visit Number 55    Number of Visits 70    Date for Recertification  03/20/24   corrected   Progress Note Due on Visit 50    PT Start Time 1041    PT Stop Time 1100    PT Time Calculation (min) 19 min    Equipment Utilized During Treatment Gait belt    Activity Tolerance Patient tolerated treatment well;No increased pain    Behavior During Therapy [facility] for tasks assessed/performed         Past Medical History:  Diagnosis Date   Abdominal pain, right upper quadrant    Back pain    Calculus of kidney 12/09/2013   Chronic back pain    unspecified   Chronic left shoulder pain 07/19/2015   Complication of anesthesia    Functional disorder of bladder    other   Galactorrhea 11/26/2014   Chronic    Hereditary and idiopathic neuropathy 08/19/2013   History of kidney stones    HPV test positive    Hypercholesteremia 08/19/2013   Hypertension    Incomplete bladder emptying    Microscopic hematuria    MS (multiple sclerosis)    Muscle spasticity 05/21/2014   Nonspecific findings on examination of urine    other   Osteopenia    PONV (postoperative nausea and vomiting)    Status post laparoscopic supracervical hysterectomy 11/26/2014   Tobacco user 11/26/2014   Wrist fracture    Past Surgical History:  Procedure Laterality Date   bilateral tubal ligation  1996   BREAST CYST EXCISION Left 2002   CYST EXCISION Left 05/10/2022   Procedure: CYST REMOVAL;  Surgeon: Rodolph Romano, MD;  Location: [facility] ORS;  Service: General;  Laterality: Left;   FRACTURE SURGERY     KNEE SURGERY     right   LAPAROSCOPIC SUPRACERVICAL HYSTERECTOMY  08/05/2013   ORIF WRIST FRACTURE Left 01/17/2017    Procedure: OPEN REDUCTION INTERNAL FIXATION (ORIF) WRIST FRACTURE;  Surgeon: Leora Lynwood SAUNDERS, MD;  Location: [facility] OR[facility];  Service: Orthopedics;  Laterality: Left;   RADIOLOGY WITH ANESTHESIA N/A 03/18/2020   Procedure: MRI WITH ANESTHESIA CERVICAL SPINE AND BRAIN  WITH AND WITHOUT CONTRAST;  Surgeon: Radiologist, Medication, MD;  Location: [facility] OR;  Service: Radiology;  Laterality: N/A;   TUBAL LIGATION Bilateral    VAGINAL HYSTERECTOMY  03/2006   Patient Active Problem List   Diagnosis Date Noted   Hyperglycemia 01/09/2024   Abnormal LFTs (liver function tests) 09/17/2023   Sinus tachycardia 07/07/2023   Aortic atherosclerosis 01/18/2023   Prediabetes 01/18/2023   Hypokalemia 07/29/2022   Weakness of both lower extremities 07/29/2022   Abscess of left groin 11/15/2021   Abnormal LFTs 11/15/2021   Acute respiratory disease due to COVID-19 virus 11/21/2020   Weakness    Hypoalbuminemia due to protein-calorie malnutrition    Neurogenic bowel    Neurogenic bladder    Labile blood pressure    Neuropathic pain    Multiple sclerosis, relapsing-remitting 07/09/2019   Abscess of female pelvis    SVT (supraventricular tachycardia)    Radial styloid tenosynovitis 03/12/2018   Wheelchair dependence 02/27/2018   Localized osteoporosis with current pathological fracture with routine healing 01/19/2017   Wrist fracture 01/16/2017   Sprain  of ankle 03/23/2016   Closed fracture of lateral malleolus 03/16/2016   Health care maintenance 01/24/2016   Blood pressure elevated without history of HTN 10/25/2015   Essential hypertension 10/25/2015   Multiple sclerosis 10/02/2015   Chronic left shoulder pain 07/19/2015   Multiple sclerosis exacerbation 07/14/2015   MS (multiple sclerosis) 11/26/2014   Increased body mass index 11/26/2014   HPV test positive 11/26/2014   Status post laparoscopic supracervical hysterectomy 11/26/2014   Galactorrhea 11/26/2014   Back ache 05/21/2014   Adiposity  05/21/2014   Disordered sleep 05/21/2014   Muscle spasticity 05/21/2014   Spasticity 05/21/2014   Calculus of kidney 12/09/2013   Renal colic 12/09/2013   Hypercholesteremia 08/19/2013   Hereditary and idiopathic neuropathy 08/19/2013   Hypercholesterolemia without hypertriglyceridemia 08/19/2013   Bladder infection, chronic 07/25/2012   Disorder of bladder function 07/25/2012   Incomplete bladder emptying 07/25/2012   Microscopic hematuria 07/25/2012   Right upper quadrant pain 07/25/2012    ONSET DATE: 1995  REFERRING DIAG: MS  THERAPY DIAG:  Muscle weakness (generalized)  Other lack of coordination  Multiple sclerosis exacerbation  Difficulty in walking, not elsewhere classified  Unsteadiness on feet  Abnormality of gait and mobility  Other abnormalities of gait and mobility  Rationale for Evaluation and Treatment: Rehabilitation  SUBJECTIVE:                                                                                                                                                                                             SUBJECTIVE STATEMENT:  Pt arrived late to the clinic due to the transportation not picking her up on time.  Pt visibly upset for being late.  Pt is mad, but ready to put her best effort into the remaining time left.  Pt accompanied by: self  PERTINENT HISTORY:  Patient is returning to PT s/p hospitalization.  s/p ORIF of R tibia shaft fracture 09/23/2022. Patient has weakness in BLE with RLE>LLE. She drives with hand controls. Patient has been diagnosed with MS in 1995. PMH includes: back pain, CBP, chronic L shoulder pain, galactorrhea, neuropathy, HPV, hypercholestermeia, HTN, MS, osteopenia, PONV, wrist fracture. Additional order for other closed fracture of proximal end of R tibia with routine healing. Still has to use [employer].   PAIN:  Are you having pain? Occasional pain in RLE; primarily in knee  PRECAUTIONS: Fall  RED  FLAGS: None   WEIGHT BEARING RESTRICTIONS: No  FALLS: Has patient fallen in last 6 months? No  LIVING ENVIRONMENT: Lives with: lives with their family Lives in: House/apartment Stairs: ramp Has following equipment at home: Vannie - 2 wheeled,  Wheelchair (manual), shower chair, and Grab bars  PLOF: Independent with household mobility with device  PATIENT GOALS: to get her independence back. To be able to get into car, toilet, and get dressed independently  OBJECTIVE:  Note: Objective measures were completed at Evaluation unless otherwise noted.  DIAGNOSTIC FINDINGS: MRI of the brain 03/18/2020 showed multiple T2/FLAIR hyperintense foci in the periventricular, juxtacortical and deep white matter.  There were no infratentorial lesions noted.  None of the foci enhanced.   Due to severe claustrophobia, the study was done with conscious sedation in the hospital   COGNITION: Overall cognitive status: Within functional limits for tasks assessed   SENSATION: Lack of sensation in feet, loss in bilateral lateral aspect of knee  COORDINATION: Does not have the strength for functional LE heel slide test   MUSCLE TONE: BLE mild tone    POSTURE: rounded shoulders, forward head, anterior pelvic tilt, and weight shift left   LOWER EXTREMITY MMT:    MMT Right Eval Left Eval  Hip flexion 0.9 1.5  Hip extension    Hip abduction 1.8 2.6  Hip adduction 3.1 1.9  Hip internal rotation    Hip external rotation    Knee flexion 0.9 1.5  Knee extension 0.8 1.2  Ankle dorsiflexion    Ankle plantarflexion    Ankle inversion    Ankle eversion    (Blank rows = not tested)  BED MOBILITY:  Assess in future session due to limited time  TRANSFERS: Assistive device utilized: Bariatric RW with sit to stands, slide board to table  Sit to stand: unable to reach full stand Stand to sit: unable to reach full stand  Chair to chair: slide board with CGA   GAIT: Unable to ambulate at this time.    FUNCTIONAL TESTS:  Sit to stand: tricep press with BUE; unable to bring arm to walker.    Function In Sitting Test (FIST)  (1/2 femur on surface; hips/knees flexed to 90deg)   - indicate bed or mat table / step stool if used  SCORING KEY: 4 = Independent (completes task independently & successfully) 3 = Verbal Cues/Increased Time (completes task independently & successfully and only needs more time/cues) 2 = Upper Extremity Support (must use UE for support or assistance to complete successfully) 1 = Needs Assistance (unable to complete w/o physical assist; DOCUMENT LEVEL: min, mod, max) 0 = Dependent (requires complete physical assist; unable to complete successfully even w/ physical assist)  Randomly Administer Once Throughout Exam  4 - Anterior Nudge (superior sternum)  4 - Posterior Nudge (between scapular spines)  4 - Lateral Nudge (to dominant side at acromion)     4 - Static sitting (30 seconds)  4 - Sitting, shake 'no' (left and right)  4 - Sitting, eyes closed (30 seconds)   0 - Sitting, lift foot (dominant side, lift foot 1 inch twice)    2 - Pick up object from behind (object at midline, hands breadth posterior)  3 - Forward reach (use dominant arm, must complete full motion) 2 - Lateral reach (use dominant arm, clear opposite ischial tuberosity) 2 - Pick up object from floor (from between feet)   2 - Posterior scooting (move backwards 2 inches)  2 - Anterior scooting (move forward 2 inches)  2 - Lateral scooting (move to dominant side 2 inches)    TOTAL = 39/56  Notes/comments: slide board tranfer to/from table   MCD > 5 points MCID for IP REHAB > 6 points  Function In Sitting Test (FIST) 08/14/23 (1/2 femur on surface; hips/knees flexed to 90deg)   - indicate bed or mat table / step stool if used  SCORING KEY: 4 = Independent (completes task independently & successfully) 3 = Verbal Cues/Increased Time (completes task independently & successfully and only  needs more time/cues) 2 = Upper Extremity Support (must use UE for support or assistance to complete successfully) 1 = Needs Assistance (unable to complete w/o physical assist; DOCUMENT LEVEL: min, mod, max) 0 = Dependent (requires complete physical assist; unable to complete successfully even w/ physical assist)  Randomly Administer Once Throughout Exam  4 - Anterior Nudge (superior sternum)  4 - Posterior Nudge (between scapular spines)  4 - Lateral Nudge (to dominant side at acromion)     4 - Static sitting (30 seconds)  4 - Sitting, shake 'no' (left and right)  4 - Sitting, eyes closed (30 seconds)   0 - Sitting, lift foot (dominant side, lift foot 1 inch twice)    4 - Pick up object from behind (object at midline, hands breadth posterior)  4 - Forward reach (use dominant arm, must complete full motion) 4 - Lateral reach (use dominant arm, clear opposite ischial tuberosity)  - Pick up object from floor (from between feet)   3 - Posterior scooting (move backwards 2 inches)  3 - Anterior scooting (move forward 2 inches)  3 - Lateral scooting (move to dominant side 2 inches)    TOTAL = 47/56  MCD > 5 points MCID for IP REHAB > 6 points                                                                                                                              TREATMENT DATE: 01/29/24   Gait Training:  Pt able to stand with CGA in the // bars on the initial attempt.  While in standing pt was able to adjust the bottom of her shirt and pull it down while in standing, with no UE support.  Pt then performed taps to the therapist's shoulder while in standing to promote weight shift and reduced reliance on UE's.  Pt then ambulated to the end of the // bars with wheelchair follow from assistant.  Pt was able to perform this twice.  Pt performed two total attempts at standing and was able to achieve >3.5 minutes each attempt.        PATIENT EDUCATION: Education details: Pt educated  throughout session about proper posture and technique with exercises. Improved exercise technique, movement at target joints, use of target muscles after min to mod verbal, visual, tactile cues.  Person educated: Patient Education method: Explanation, Demonstration, Tactile cues, and Verbal cues Education comprehension: verbalized understanding, returned demonstration, verbal cues required, tactile cues required, and needs further education  HOME EXERCISE PROGRAM: Stand 3x/day in stander   GOALS: Goals reviewed with patient? Yes  SHORT TERM GOALS: Target date: 08/08/2023  Patient will be  independent in home exercise program to improve strength/mobility for better functional independence with ADLs.  Baseline: 4/8: compliance Goal status: MET    LONG TERM GOALS: Target date: 03/20/2024  Patient will tolerate five consecutive stands with UE support from wheelchair to standing to improve functional mobility with additional cushion and RW.  Baseline: unable to perform 4/8:unable to perform full stand 5/20: perform next session 5/29: two full stands with CGA and cushion on her seat 7/31: able to stand 3 times from [facility]. With no addition cushion  in parallel bars. 12/27/2023= Will retest next session- concentrated on just standing on past 2 visits Goal status: Ongoing   2.  Patient will ambulate 10 ft with bRW with wheelchair follow.  Baseline:  unable to ambulate 4/8: unable to ambulate 5/20: able to ambulate length of // bars in previous session 5/27: ambulate 2.5 lengths of // bars with two seated rest breaks with min A for weight shift and wheelchair follow  7/31: able to ambulate in parallel bars 3 ft with min assist and +2 follow in WC> difficulty advancing the LLE on this day. 12/27/2023- Patient unable to take a step forward today but was abel to take a step on previous visit- limited by clonus like trembling that has progressed over past month. Goal status: Ongoing   3.  Patient will  improve FIST score >6 points to demonstrate improved stability and ability to perform ADLs.  Baseline:  3/6: 39/56 4/8: 47/ 56 5/20: 48/56 7/31:46/56 Goal status: MET  4.  Patient will perform toileting with mod I at home for improved independence.  Baseline: 3/6: requires assistance 4/8: requires dependence on machine 5/20: requires assistance with use of wipe buddy.  7/31: continues to require use of sara steady and wipe buddy for safety and hygiene. 12/27/2023- Patient reports that since she is unable to take steps that she continues to require use of sara steady and wipe buddy for safe personal hygiene. Goal status: ongoing   5. Patient will perform > 5 min of static or dynamic standing with BUE and Min/mod assist  for improved LE strength and pre-gait function to assist with toileting and overall standing ability for improve functional independence.  Baseline: 12/27/2023-Patient was able to stand for just over 3 min today. 01/10/24: Pt able to stand 1 min 38 sec Goal status: NOT PROGRESSING    ASSESSMENT:  CLINICAL IMPRESSION:  Pt performed well again today, although limited by time.  Pt ultimately performed well with the ambulation attempts and in standing, with the increased ability to weight shift and require less assistance while in standing.  Pt will continue to perform tasks at home to promote upper body and lower body strengthening necessary for improved mobility.   Pt will continue to benefit from skilled therapy to address remaining deficits in order to improve overall QoL and return to PLOF.           OBJECTIVE IMPAIRMENTS: Abnormal gait, cardiopulmonary status limiting activity, decreased activity tolerance, decreased balance, decreased coordination, decreased endurance, decreased mobility, difficulty walking, decreased ROM, decreased strength, hypomobility, increased fascial restrictions, impaired perceived functional ability, impaired flexibility, impaired sensation,  improper body mechanics, and postural dysfunction.   ACTIVITY LIMITATIONS: carrying, lifting, bending, sitting, standing, squatting, sleeping, stairs, transfers, bed mobility, continence, bathing, toileting, dressing, self feeding, reach over head, hygiene/grooming, locomotion level, and caring for others  PARTICIPATION LIMITATIONS: meal prep, cleaning, laundry, interpersonal relationship, driving, shopping, community activity, and church  PERSONAL FACTORS: Age, Past/current experiences, Time since onset  of injury/illness/exacerbation, Transportation, and 3+ comorbidities: ack pain, CBP, chronic L shoulder pain, galactorrhea, neuropathy, HPV, hypercholestermeia, HTN, MS, osteopenia, PONV, wrist fracture are also affecting patient's functional outcome.   REHAB POTENTIAL: Good  CLINICAL DECISION MAKING: Evolving/moderate complexity  EVALUATION COMPLEXITY: Moderate  PLAN:  PT FREQUENCY: 2x/week  PT DURATION: 12 weeks  PLANNED INTERVENTIONS: 97164- PT Re-evaluation, 97110-Therapeutic exercises, 97530- Therapeutic activity, 97112- Neuromuscular re-education, 97535- Self Care, [id number]- Manual therapy, [phone]- Gait training, [phone]- Orthotic Fit/training, [phone]- Canalith repositioning, [id number]- Aquatic Therapy, [phone]- Electrical stimulation (unattended), [phone]- Electrical stimulation (manual), [id number]- Ultrasound, [id number]- Traction (mechanical), Patient/Family education, Balance training, Stair training, Taping, Dry Needling, Joint mobilization, Joint manipulation, Spinal manipulation, Spinal mobilization, Scar mobilization, Compression bandaging, Vestibular training, Visual/preceptual remediation/compensation, Cognitive remediation, DME instructions, Cryotherapy, Moist heat, and Biofeedback  PLAN FOR NEXT SESSION:  Strap stand frame with more dynamic activities Sit to stands; try supported walking, continue dynamic core stabilization training and seated reaching tasks to improve weight shifting.     Fonda Simpers, PT, DPT Physical Therapist - [facility]  01/29/24, 11:14 AM

## 2024-01-29 NOTE — Therapy (Signed)
 OUTPATIENT OCCUPATIONAL THERAPY NEURO TREATMENT NOTE  Patient Name: Lisa Williams MRN: [id number] DOB:1960/08/29, 63 y.o., female Today's Date: 01/29/2024  PCP: Dr. Layman Piety   Neurologist Dr. Suanne at [facility] REFERRING PROVIDER: Dr. Layman Piety    END OF SESSION:  OT End of Session - 01/29/24 1435     Visit Number 41    Number of Visits 44    Date for Recertification  02/05/24    Authorization Time Period Reporting period beginning 01/24/24    Progress Note Due on Visit 50    OT Start Time 1100    OT Stop Time 1145    OT Time Calculation (min) 45 min    Equipment Utilized During Treatment manual wc    Activity Tolerance Patient tolerated treatment well    Behavior During Therapy [facility] for tasks assessed/performed         Past Medical History:  Diagnosis Date   Abdominal pain, right upper quadrant    Back pain    Calculus of kidney 12/09/2013   Chronic back pain    unspecified   Chronic left shoulder pain 07/19/2015   Complication of anesthesia    Functional disorder of bladder    other   Galactorrhea 11/26/2014   Chronic    Hereditary and idiopathic neuropathy 08/19/2013   History of kidney stones    HPV test positive    Hypercholesteremia 08/19/2013   Hypertension    Incomplete bladder emptying    Microscopic hematuria    MS (multiple sclerosis)    Muscle spasticity 05/21/2014   Nonspecific findings on examination of urine    other   Osteopenia    PONV (postoperative nausea and vomiting)    Status post laparoscopic supracervical hysterectomy 11/26/2014   Tobacco user 11/26/2014   Wrist fracture    Past Surgical History:  Procedure Laterality Date   bilateral tubal ligation  1996   BREAST CYST EXCISION Left 2002   CYST EXCISION Left 05/10/2022   Procedure: CYST REMOVAL;  Surgeon: Rodolph Romano, MD;  Location: [facility] ORS;  Service: General;  Laterality: Left;   FRACTURE SURGERY     KNEE SURGERY     right   LAPAROSCOPIC  SUPRACERVICAL HYSTERECTOMY  08/05/2013   ORIF WRIST FRACTURE Left 01/17/2017   Procedure: OPEN REDUCTION INTERNAL FIXATION (ORIF) WRIST FRACTURE;  Surgeon: Leora Lynwood SAUNDERS, MD;  Location: [facility] ORS;  Service: Orthopedics;  Laterality: Left;   RADIOLOGY WITH ANESTHESIA N/A 03/18/2020   Procedure: MRI WITH ANESTHESIA CERVICAL SPINE AND BRAIN  WITH AND WITHOUT CONTRAST;  Surgeon: Radiologist, Medication, MD;  Location: [facility] OR;  Service: Radiology;  Laterality: N/A;   TUBAL LIGATION Bilateral    VAGINAL HYSTERECTOMY  03/2006   Patient Active Problem List   Diagnosis Date Noted   Hyperglycemia 01/09/2024   Abnormal LFTs (liver function tests) 09/17/2023   Sinus tachycardia 07/07/2023   Aortic atherosclerosis 01/18/2023   Prediabetes 01/18/2023   Hypokalemia 07/29/2022   Weakness of both lower extremities 07/29/2022   Abscess of left groin 11/15/2021   Abnormal LFTs 11/15/2021   Acute respiratory disease due to COVID-19 virus 11/21/2020   Weakness    Hypoalbuminemia due to protein-calorie malnutrition    Neurogenic bowel    Neurogenic bladder    Labile blood pressure    Neuropathic pain    Multiple sclerosis, relapsing-remitting 07/09/2019   Abscess of female pelvis    SVT (supraventricular tachycardia)    Radial styloid tenosynovitis 03/12/2018   Wheelchair dependence 02/27/2018   Localized  osteoporosis with current pathological fracture with routine healing 01/19/2017   Wrist fracture 01/16/2017   Sprain of ankle 03/23/2016   Closed fracture of lateral malleolus 03/16/2016   Health care maintenance 01/24/2016   Blood pressure elevated without history of HTN 10/25/2015   Essential hypertension 10/25/2015   Multiple sclerosis 10/02/2015   Chronic left shoulder pain 07/19/2015   Multiple sclerosis exacerbation 07/14/2015   MS (multiple sclerosis) 11/26/2014   Increased body mass index 11/26/2014   HPV test positive 11/26/2014   Status post laparoscopic supracervical hysterectomy  11/26/2014   Galactorrhea 11/26/2014   Back ache 05/21/2014   Adiposity 05/21/2014   Disordered sleep 05/21/2014   Muscle spasticity 05/21/2014   Spasticity 05/21/2014   Calculus of kidney 12/09/2013   Renal colic 12/09/2013   Hypercholesteremia 08/19/2013   Hereditary and idiopathic neuropathy 08/19/2013   Hypercholesterolemia without hypertriglyceridemia 08/19/2013   Bladder infection, chronic 07/25/2012   Disorder of bladder function 07/25/2012   Incomplete bladder emptying 07/25/2012   Microscopic hematuria 07/25/2012   Right upper quadrant pain 07/25/2012   ONSET DATE: 5/24 Amon and broke R leg which caused beginning of ADL decline; diagnosed with MS in 1995)  REFERRING DIAG: MS  THERAPY DIAG:  Muscle weakness (generalized)  Other lack of coordination  Multiple sclerosis exacerbation  Rationale for Evaluation and Treatment: Rehabilitation  SUBJECTIVE:  SUBJECTIVE STATEMENT: Pt reports she let go of UE support briefly when standing during PT session today to adjust her clothing (no LOB). Pt accompanied by: self  PERTINENT HISTORY: Pt reports increased difficulty with basic self care tasks since breaking her R leg last May.  Since that time, mobility and ADLs have declined, with pt requiring assist from private caregivers and family to manage bathing, toileting, dressing, and functional transfers.  PRECAUTIONS: Fall  WEIGHT BEARING RESTRICTIONS: No  PAIN: 01/24/24: No pain  Are you having pain? No; occasional pain in R lower back, but not today  FALLS: Has patient fallen in last 6 months? No Last fall was May 17 of 2024  LIVING ENVIRONMENT: Lives with: lives with their family (including mother who has dementia and sister [name])  Lives in: 2 level home but pt resides on main level Stairs: ramp  Has following equipment at home: Wheelchair (manual), Shower bench, and hand held shower shower, 1 grab bar in the shower, 3in1 commode, sit to stand lift (electric), FWW,  hospital bed  PLOF: modified indep with ADL/IADLs prior to May of 7975   PATIENT GOALS: Increase independence with basic self care tasks  OBJECTIVE:  Note: Objective measures were completed at Evaluation unless otherwise noted.  HAND DOMINANCE: Right  ADLs:  Overall ADLs: assist provided from paid caregiver and sister Transfers/ambulation related to ADLs: sit to stand lift for all transfers, set up for sliding board in/out of bed  Eating: indep  Grooming: modified indep (wc level in mom's bathroom)  UB Dressing: set up (can't access her bedroom closet)  LB Dressing: able to sit on side of bed to don all LB clothing; assist to hike pants/underwear Toileting: assist with clothing management d/t standing within electric lift Bathing: bed bath with sister helping to wash backside Tub Shower transfers: N/A; pt has walk in shower in mom's bathroom (wc does fit in this bathroom but pt reports inability to transfer from wc and requires arm rests on a bench for a successful transfer attempt from any DME).  Tub bench is in pt's bathroom, but lift does not fit through bathroom doorway and pt  reports inability to transfer up from tub bench (currently unable to manage either transfer)   Equipment: see above  IADLs: Shopping: [employer] for shopping Light housekeeping: modified indep from wc level Meal Prep: modified indep from wc level Community mobility: relies on community transportation or family members Medication management: indep Landscape architect: indep Handwriting: NT; pt denies any [facility] challenges  MOBILITY STATUS: Hx of falls  POSTURE COMMENTS:  Rounded shoulders, forward head, anterior pelvic tilt, and weight shift left   ACTIVITY TOLERANCE: Eval: Activity tolerance: Per PT; currently standing 30-60 sec within Light Gait harness/lift; currently non-ambulatory  01/24/24: Standing at sink: 1 minute; min A and 5 attempts to achieve fully erect standing position with BUEs  supported on sink countertop, OT assist to block R knee to prevent buckling   UPPER EXTREMITY ROM:  BUEs WFL  UPPER EXTREMITY MMT:     MMT Right eval Left eval  Shoulder flexion 4+ 4  Shoulder abduction 4+ 4  Shoulder adduction    Shoulder extension    Shoulder internal rotation 4+ 4+  Shoulder external rotation 4 4  Middle trapezius    Lower trapezius    Elbow flexion 4+ 4+  Elbow extension 4+ 4+  Wrist flexion 4+ 4+  Wrist extension 4+ 4+  Wrist ulnar deviation    Wrist radial deviation    Wrist pronation    Wrist supination    (Blank rows = not tested)  HAND FUNCTION/COORDINATION:  R/L WNL; pt denies any coordination deficits/ able to manipulate pills/clothing fasteners/etc without difficulty  11/15/23: *Measures taken d/t PT note revealing pt with increased difficulty manipulating smaller ADL supplies during reaching activities in PT session   11/15/23: Grip strength: R: 70 lbs, L: 63 lbs Pinch strength: Lateral: R: 16 lbs, L: 15 lbs; 3 point pinch: R: 12 lbs, L: 13 lbs 9 hole peg test: Right: 25 sec, L 49 sec   12/25/23: Grip strength: Right: 70 lbs; Left: 65 lbs  9 hole peg test: Right: 24 sec; Left: 3 trials: 54 sec, 1 min, 56 sec  01/24/24: Grip grip: Right: 76 lbs; Left: 67 lbs Lateral pinch: R: 17 lbs, L: 17 lbs; 3 point pinch: R: 13 lbs, L: 14 lbs  9 Hole Peg Test: R: 26 sec, L: 50 sec  SENSATION: WFL  EDEMA: No visible edema in BUEs  MUSCLE TONE: BUEs WNL  COGNITION: Overall cognitive status: Within functional limits for tasks assessed  VISION: wears glasses all the time, no reports of diplopia  PERCEPTION: Not tested  PRAXIS: WFL  OBSERVATIONS:  Pt pleasant, cooperative, and motivated to work towards improving indep with ADLs.                                                                                                     TREATMENT DATE: 01/29/24 Self Care: -Scoot pivot wc<>toilet; min guard and OT assist to adjust wc positioning to ensure pt  would scoot to center of toilet to reduce fall risk. -Clothing management on commode performed with SBA; pt attempted to void but was unable (pt plans to increase  water intake before OT session next visit) -Sit to stand prep for ADL transfers: Pt practiced forward weight shift to achieve partial sit to stand from wc level, knees blocked with pillow cushioning edge of chair in front of her (chair stabilized by wall).   -Pt performed above without extra 2.5 foam cushion today; wc cushion only, and min vc for increased anterior weight shift  PATIENT EDUCATION: Education details: Teacher, early years/pre  Person educated: Patient Education method: Explanation Education comprehension: verbalized understanding  HOME EXERCISE PROGRAM: IR A/AAROM to promote shoulder flexibility for peri care/bathing  GOALS: Goals reviewed with patient? Yes  SHORT TERM GOALS: Target date: 12/25/23  Pt will perform bed bath with set up only. Baseline: Eval: Min-mod A for posterior washing; 09/28/23: Min A for posterior washing; 11/13/23: Able to manage bed bath but sister helps with thoroughness, per pt's preference; pt in agreement to trial bath sitting EOB or in wc before next OT session. Goal status: achieved/d/c  2.  Pt will utilize sliding board for wc<>drop arm commode transfer with SBA. Baseline: Eval: Currently using electric lift for transfer to [facility]; 09/28/23: Not yet completed at home but transfers to commode have been easier without sliding board d/t pt implementing a scoot pivot. Goal status: d/c (pt does not prefer sliding board for commode transfer)  3.  Pt will be indep to perform HEP for maintaining BUE strength for ADLs and functional transfers. Baseline: Eval: HEP not yet initiated; 09/28/23: Pt is working on core stability exercises from wc, cane stretches for bilat shoulder IR, and tricep extension via wc pushups; 11/13/23: indep Goal status: achieved  LONG TERM GOALS: Target date: 02/05/24  Pt will  perform sink bath with set up A. (Revised on 11/13/23 from min A to set up) Baseline: Eval: Currently bed bathing d/t inability to stand at sink without electric lift (lift does not fit into pt's bathroom); 09/28/23: still completing bed bath as pt is not yet able to stand; 11/13/23: Not yet attempted; pt encouraged to attempt before next OT session now that bed bath goal has been met; 12/20/23: Pt is now completing sponge bath with min A while seated EOB (caregiver assists with washing peri area d/t no bed rail to aid in R lateral lean); 01/24/24: Performing with set up on EOB, completing lateral weight shifts to wash posterior/peri area with wc in front of pt and bed rail for LUE support and foot of bed for RUE support  Goal status: achieved  2.  Pt will perform wc<>tub bench transfer with min A. Baseline: Eval: Unable; 09/28/23: Performed in OT clinic with min A, but not yet in the home Goal status: d/c (pt prefers sink bath)  3.  Pt will perform squat pivot transfer wc<>BSC with SBA.  Baseline: Eval: Eval: Currently using electric lift for transfer to [facility]; 09/28/23: Not yet completed in clinic with [facility], and using lift for [facility] in the home still; 11/13/23: Still using Camie lift at home and in clinic transfers are all scoot rather than squat pivots d/t LE weakness; 12/20/23: Pt reports that she plans to use her Camie lift until she can ambulate to her [facility] d/t limited space for wc in her desired location for [facility] at home. Goal status: d/c  4. Pt will perform scoot transfer wc<>toilet using grab bars with supv to allow toileting in community setting.  Baseline: Eval: Pt limits community outings d/t requiring use of lift for [facility] transfers; 09/28/23: Pt has performed 2 successful scoot pivot  Transfers to toilet in OT clinic with min A, extra time.  Further trials with clothing management on toilet needed, but pt can lower and hike  pants while sititng edge of mat via lateral leaning and min A to maintain core  stability; 11/13/23: Pt performs with CGA and extra time; 12/19/23: Pt  performs with close supv on most attempts, occasional CGA; efficiency is improving; 01/24/24: ongoing improvements; able to complete in fewer scoots (3 scoots each way from wc<>toilet) d/t improve engagement of Les and improved anterior weight shift; occasional min guard-supv; pt completes with therapist in clinic, but not yet in other community settings with sister, and not yet voided on toilet in community    Goal status: ongoing  5.  Pt will complete seated clothing management with min A to enable voiding in handicapped stall within community. Baseline: Recert 11/13/23: Pt can transfer to toilet with increased time and effort, but has not yet attempted clothing management or voiding while seated on commode in community setting.  Pt can lower pants to knees while seated in wc, but requires mod A to hike over hips from seated position; 12/20/23: Pt fully lowers panties and shorts while seated on commode with supv, can hike clothing to her her upper thighs, transfers back to wc, and then performs a tricep extension from wc level to allow caregiver to hike clothing remainder of the way for purposes of EC (min A for clothing management component) Goal status: achieved  6.  Pt will tolerate standing x1 min with close supv and BUE support to manage clothing in prep for toileting. Baseline: Eval: Currently standing in electric lift only, or within parallel bars without lift; 09/28/23: Not yet attempted; 11/13/23: Pt is able to perform static standing with heavy BUE support on parallel bars with PT; 12/19/22: Not attempted recently d/t new intermittent clonus in BLEs; 01/24/24: Tolerated standing 1 min today at sink with min A for ascent, OT assist to block R knee from buckling Goal status: in progress  7.  Pt will increase bilat grip strength in order to securely grasp walker sufficiently for functional transfers and ambulation.  Baseline: Recert  11/13/23: TBD (PT note read following OT session, indicating pt with difficulty securing items in hand);  11/15/23: Grip strength: R: 70 lbs, L: 63 lbs; 12/25/23: R 76 lbs, L 67 lbs; sufficient for functional transfers/amb  Goal status: achieved  8.  Pt will increase [facility] skills in order to manipulate small ADL supplies with reduced dropping as indicated by 20 sec or more improvement in 9 hole peg test. Baseline: Recert 11/13/23: 9 hole peg test TBD (PT note read following OT session, indicating pt with difficulty manipulating and securing items in hands); 11/15/23: Right: 25 sec, L 49 sec; 12/25/23: Right: 24 sec; Left: 3 trials: 54 sec, 1 min, 56 sec; 01/24/24: R: 26 sec, L: 50 sec  Goal status: in progress  ASSESSMENT: CLINICAL IMPRESSION: Pt made her first attempt to void on commode this date, but was unable.  No attempts made until today d/t decreased activity tolerance d/t transfer itself combined with clothing management causes increased fatigue, but pt felt ready to attempt.  Unable to void but pt will plan to increase water intake prior to OT session and attempt again.  Anterior WS with partial sit to stands practiced today without extra 2.5 foam cushion; wc cushion only.  This did limit pt's ability to weight shift forward, but pt was also fatigued from toilet transfer.  Pt will  continue to benefit from skilled OT to work towards above noted goals in OT poc, working to maximize indep with daily tasks while reducing burden of care on caregivers.   PERFORMANCE DEFICITS: in functional skills including ADLs, IADLs, strength, pain, flexibility, Gross motor control, mobility, balance, body mechanics, endurance, and decreased knowledge of use of DME, and psychosocial skills including coping strategies, environmental adaptation, habits, and routines and behaviors.   IMPAIRMENTS: are limiting patient from ADLs, IADLs, and social participation.   CO-MORBIDITIES: has co-morbidities such as neuropathy, back  pain, obesity, MS, HTN that affects occupational performance. Patient will benefit from skilled OT to address above impairments and improve overall function.  MODIFICATION OR ASSISTANCE TO COMPLETE EVALUATION: No modification of tasks or assist necessary to complete an evaluation.  OT OCCUPATIONAL PROFILE AND HISTORY: Detailed assessment: Review of records and additional review of physical, cognitive, psychosocial history related to current functional performance.  CLINICAL DECISION MAKING: Moderate - several treatment options, min-mod task modification necessary  REHAB POTENTIAL: Good  EVALUATION COMPLEXITY: Moderate  PLAN:  OT FREQUENCY: 2x/week  OT DURATION: 12 weeks  PLANNED INTERVENTIONS: 97168 OT Re-evaluation, 97535 self care/ADL training, 02889 therapeutic exercise, 97530 therapeutic activity, 97112 neuromuscular re-education, 97140 manual therapy, 97116 gait training, 02989 moist heat, 97010 cryotherapy, balance training, functional mobility training, psychosocial skills training, energy conservation, coping strategies training, patient/family education, and DME and/or AE instructions  RECOMMENDED OTHER SERVICES: None at this time (Pt currently receiving PT services in this clinic)  CONSULTED AND AGREED WITH PLAN OF CARE: Patient  PLAN FOR NEXT SESSION: see above  Inocente Blazing, MS, OTR/L  Inocente MARLA Blazing, OT 01/29/2024, 2:37 PM

## 2024-01-31 ENCOUNTER — Ambulatory Visit

## 2024-01-31 ENCOUNTER — Ambulatory Visit: Payer: PPO

## 2024-01-31 DIAGNOSIS — R262 Difficulty in walking, not elsewhere classified: Secondary | ICD-10-CM

## 2024-01-31 DIAGNOSIS — M6281 Muscle weakness (generalized): Secondary | ICD-10-CM | POA: Diagnosis not present

## 2024-01-31 DIAGNOSIS — R278 Other lack of coordination: Secondary | ICD-10-CM

## 2024-01-31 DIAGNOSIS — R269 Unspecified abnormalities of gait and mobility: Secondary | ICD-10-CM

## 2024-01-31 DIAGNOSIS — G35 Multiple sclerosis: Secondary | ICD-10-CM

## 2024-01-31 DIAGNOSIS — R2689 Other abnormalities of gait and mobility: Secondary | ICD-10-CM

## 2024-01-31 DIAGNOSIS — R2681 Unsteadiness on feet: Secondary | ICD-10-CM

## 2024-01-31 NOTE — Therapy (Signed)
 OUTPATIENT PHYSICAL THERAPY NEURO TREATMENT  Patient Name: Lisa Williams MRN: [id number] DOB:11/19/1960, 63 y.o., female Today's Date: 01/31/2024  PCP: Lenon Na  REFERRING PROVIDER: Lenon Na  END OF SESSION:  PT End of Session - 01/31/24 1059     Visit Number 56    Number of Visits 70    Date for Recertification  03/20/24   corrected   Progress Note Due on Visit 50    PT Start Time 1015    PT Stop Time 1057    PT Time Calculation (min) 42 min    Equipment Utilized During Treatment Gait belt    Activity Tolerance Patient tolerated treatment well;No increased pain    Behavior During Therapy [facility] for tasks assessed/performed         Past Medical History:  Diagnosis Date   Abdominal pain, right upper quadrant    Back pain    Calculus of kidney 12/09/2013   Chronic back pain    unspecified   Chronic left shoulder pain 07/19/2015   Complication of anesthesia    Functional disorder of bladder    other   Galactorrhea 11/26/2014   Chronic    Hereditary and idiopathic neuropathy 08/19/2013   History of kidney stones    HPV test positive    Hypercholesteremia 08/19/2013   Hypertension    Incomplete bladder emptying    Microscopic hematuria    MS (multiple sclerosis)    Muscle spasticity 05/21/2014   Nonspecific findings on examination of urine    other   Osteopenia    PONV (postoperative nausea and vomiting)    Status post laparoscopic supracervical hysterectomy 11/26/2014   Tobacco user 11/26/2014   Wrist fracture    Past Surgical History:  Procedure Laterality Date   bilateral tubal ligation  1996   BREAST CYST EXCISION Left 2002   CYST EXCISION Left 05/10/2022   Procedure: CYST REMOVAL;  Surgeon: Rodolph Romano, MD;  Location: [facility] ORS;  Service: General;  Laterality: Left;   FRACTURE SURGERY     KNEE SURGERY     right   LAPAROSCOPIC SUPRACERVICAL HYSTERECTOMY  08/05/2013   ORIF WRIST FRACTURE Left 01/17/2017    Procedure: OPEN REDUCTION INTERNAL FIXATION (ORIF) WRIST FRACTURE;  Surgeon: Leora Lynwood SAUNDERS, MD;  Location: [facility] OR[facility];  Service: Orthopedics;  Laterality: Left;   RADIOLOGY WITH ANESTHESIA N/A 03/18/2020   Procedure: MRI WITH ANESTHESIA CERVICAL SPINE AND BRAIN  WITH AND WITHOUT CONTRAST;  Surgeon: Radiologist, Medication, MD;  Location: [facility] OR;  Service: Radiology;  Laterality: N/A;   TUBAL LIGATION Bilateral    VAGINAL HYSTERECTOMY  03/2006   Patient Active Problem List   Diagnosis Date Noted   Hyperglycemia 01/09/2024   Abnormal LFTs (liver function tests) 09/17/2023   Sinus tachycardia 07/07/2023   Aortic atherosclerosis 01/18/2023   Prediabetes 01/18/2023   Hypokalemia 07/29/2022   Weakness of both lower extremities 07/29/2022   Abscess of left groin 11/15/2021   Abnormal LFTs 11/15/2021   Acute respiratory disease due to COVID-19 virus 11/21/2020   Weakness    Hypoalbuminemia due to protein-calorie malnutrition    Neurogenic bowel    Neurogenic bladder    Labile blood pressure    Neuropathic pain    Multiple sclerosis, relapsing-remitting 07/09/2019   Abscess of female pelvis    SVT (supraventricular tachycardia)    Radial styloid tenosynovitis 03/12/2018   Wheelchair dependence 02/27/2018   Localized osteoporosis with current pathological fracture with routine healing 01/19/2017   Wrist fracture 01/16/2017   Sprain  of ankle 03/23/2016   Closed fracture of lateral malleolus 03/16/2016   Health care maintenance 01/24/2016   Blood pressure elevated without history of HTN 10/25/2015   Essential hypertension 10/25/2015   Multiple sclerosis 10/02/2015   Chronic left shoulder pain 07/19/2015   Multiple sclerosis exacerbation 07/14/2015   MS (multiple sclerosis) 11/26/2014   Increased body mass index 11/26/2014   HPV test positive 11/26/2014   Status post laparoscopic supracervical hysterectomy 11/26/2014   Galactorrhea 11/26/2014   Back ache 05/21/2014   Adiposity  05/21/2014   Disordered sleep 05/21/2014   Muscle spasticity 05/21/2014   Spasticity 05/21/2014   Calculus of kidney 12/09/2013   Renal colic 12/09/2013   Hypercholesteremia 08/19/2013   Hereditary and idiopathic neuropathy 08/19/2013   Hypercholesterolemia without hypertriglyceridemia 08/19/2013   Bladder infection, chronic 07/25/2012   Disorder of bladder function 07/25/2012   Incomplete bladder emptying 07/25/2012   Microscopic hematuria 07/25/2012   Right upper quadrant pain 07/25/2012    ONSET DATE: 1995  REFERRING DIAG: MS  THERAPY DIAG:  Muscle weakness (generalized)  Other lack of coordination  Multiple sclerosis exacerbation  Difficulty in walking, not elsewhere classified  Unsteadiness on feet  Abnormality of gait and mobility  Other abnormalities of gait and mobility  Rationale for Evaluation and Treatment: Rehabilitation  SUBJECTIVE:                                                                                                                                                                                             SUBJECTIVE STATEMENT:  Pt reports she's ready to begin therapy and she's having a good day so far.   Pt accompanied by: self  PERTINENT HISTORY:  Patient is returning to PT s/p hospitalization.  s/p ORIF of R tibia shaft fracture 09/23/2022. Patient has weakness in BLE with RLE>LLE. She drives with hand controls. Patient has been diagnosed with MS in 1995. PMH includes: back pain, CBP, chronic L shoulder pain, galactorrhea, neuropathy, HPV, hypercholestermeia, HTN, MS, osteopenia, PONV, wrist fracture. Additional order for other closed fracture of proximal end of R tibia with routine healing. Still has to use [employer].   PAIN:  Are you having pain? Occasional pain in RLE; primarily in knee  PRECAUTIONS: Fall  RED FLAGS: None   WEIGHT BEARING RESTRICTIONS: No  FALLS: Has patient fallen in last 6 months? No  LIVING ENVIRONMENT: Lives  with: lives with their family Lives in: House/apartment Stairs: ramp Has following equipment at home: Vannie - 2 wheeled, Wheelchair (manual), shower chair, and Grab bars  PLOF: Independent with household mobility with device  PATIENT GOALS: to get her independence back.  To be able to get into car, toilet, and get dressed independently  OBJECTIVE:  Note: Objective measures were completed at Evaluation unless otherwise noted.  DIAGNOSTIC FINDINGS: MRI of the brain 03/18/2020 showed multiple T2/FLAIR hyperintense foci in the periventricular, juxtacortical and deep white matter.  There were no infratentorial lesions noted.  None of the foci enhanced.   Due to severe claustrophobia, the study was done with conscious sedation in the hospital   COGNITION: Overall cognitive status: Within functional limits for tasks assessed   SENSATION: Lack of sensation in feet, loss in bilateral lateral aspect of knee  COORDINATION: Does not have the strength for functional LE heel slide test   MUSCLE TONE: BLE mild tone    POSTURE: rounded shoulders, forward head, anterior pelvic tilt, and weight shift left   LOWER EXTREMITY MMT:    MMT Right Eval Left Eval  Hip flexion 0.9 1.5  Hip extension    Hip abduction 1.8 2.6  Hip adduction 3.1 1.9  Hip internal rotation    Hip external rotation    Knee flexion 0.9 1.5  Knee extension 0.8 1.2  Ankle dorsiflexion    Ankle plantarflexion    Ankle inversion    Ankle eversion    (Blank rows = not tested)  BED MOBILITY:  Assess in future session due to limited time  TRANSFERS: Assistive device utilized: Bariatric RW with sit to stands, slide board to table  Sit to stand: unable to reach full stand Stand to sit: unable to reach full stand  Chair to chair: slide board with CGA   GAIT: Unable to ambulate at this time.   FUNCTIONAL TESTS:  Sit to stand: tricep press with BUE; unable to bring arm to walker.    Function In Sitting Test (FIST)   (1/2 femur on surface; hips/knees flexed to 90deg)   - indicate bed or mat table / step stool if used  SCORING KEY: 4 = Independent (completes task independently & successfully) 3 = Verbal Cues/Increased Time (completes task independently & successfully and only needs more time/cues) 2 = Upper Extremity Support (must use UE for support or assistance to complete successfully) 1 = Needs Assistance (unable to complete w/o physical assist; DOCUMENT LEVEL: min, mod, max) 0 = Dependent (requires complete physical assist; unable to complete successfully even w/ physical assist)  Randomly Administer Once Throughout Exam  4 - Anterior Nudge (superior sternum)  4 - Posterior Nudge (between scapular spines)  4 - Lateral Nudge (to dominant side at acromion)     4 - Static sitting (30 seconds)  4 - Sitting, shake 'no' (left and right)  4 - Sitting, eyes closed (30 seconds)   0 - Sitting, lift foot (dominant side, lift foot 1 inch twice)    2 - Pick up object from behind (object at midline, hands breadth posterior)  3 - Forward reach (use dominant arm, must complete full motion) 2 - Lateral reach (use dominant arm, clear opposite ischial tuberosity) 2 - Pick up object from floor (from between feet)   2 - Posterior scooting (move backwards 2 inches)  2 - Anterior scooting (move forward 2 inches)  2 - Lateral scooting (move to dominant side 2 inches)    TOTAL = 39/56  Notes/comments: slide board tranfer to/from table   MCD > 5 points MCID for IP REHAB > 6 points  Function In Sitting Test (FIST) 08/14/23 (1/2 femur on surface; hips/knees flexed to 90deg)   - indicate bed or mat table /  step stool if used  SCORING KEY: 4 = Independent (completes task independently & successfully) 3 = Verbal Cues/Increased Time (completes task independently & successfully and only needs more time/cues) 2 = Upper Extremity Support (must use UE for support or assistance to complete successfully) 1 = Needs  Assistance (unable to complete w/o physical assist; DOCUMENT LEVEL: min, mod, max) 0 = Dependent (requires complete physical assist; unable to complete successfully even w/ physical assist)  Randomly Administer Once Throughout Exam  4 - Anterior Nudge (superior sternum)  4 - Posterior Nudge (between scapular spines)  4 - Lateral Nudge (to dominant side at acromion)     4 - Static sitting (30 seconds)  4 - Sitting, shake 'no' (left and right)  4 - Sitting, eyes closed (30 seconds)   0 - Sitting, lift foot (dominant side, lift foot 1 inch twice)    4 - Pick up object from behind (object at midline, hands breadth posterior)  4 - Forward reach (use dominant arm, must complete full motion) 4 - Lateral reach (use dominant arm, clear opposite ischial tuberosity)  - Pick up object from floor (from between feet)   3 - Posterior scooting (move backwards 2 inches)  3 - Anterior scooting (move forward 2 inches)  3 - Lateral scooting (move to dominant side 2 inches)    TOTAL = 47/56  MCD > 5 points MCID for IP REHAB > 6 points                                                                                                                              TREATMENT DATE: 01/31/24  Gait Training:  Pt able to ambulate with use of walker and chair follow, along with author providing support with use of gait belt for safety.  Pt with initial difficulty getting into standing position, but with verbal cuing for hand placement and utilization of one hand on the walker and one hand on the wheelchair, pt was able to come upright into standing.  Pt then ambulated 12' 5 before needing to rest.  Pt assisted back to the wheelchair.  Pt attempted to stand 3 more attempts, and was able to get upright into standing, however was unable to advance the legs again.  Pt made good progress with ambulation today.   TherAct: To improve functional movements patterns for everyday tasks  Standing frame (using the Strap  stand): Patient able to push up from chair to minimally raise her bottom to allow author to position gluteal padding underneath her.   Dependent with foot straps and locking of frame.   Standing balloon taps, 30 sec bouts x4 total reps with intermittent UE support   PATIENT EDUCATION: Education details: Pt educated throughout session about proper posture and technique with exercises. Improved exercise technique, movement at target joints, use of target muscles after min to mod verbal, visual, tactile cues.  Person educated: Patient Education method: Explanation, Demonstration, Tactile cues, and Verbal  cues Education comprehension: verbalized understanding, returned demonstration, verbal cues required, tactile cues required, and needs further education  HOME EXERCISE PROGRAM: Stand 3x/day in stander   GOALS: Goals reviewed with patient? Yes  SHORT TERM GOALS: Target date: 08/08/2023  Patient will be independent in home exercise program to improve strength/mobility for better functional independence with ADLs.  Baseline: 4/8: compliance Goal status: MET    LONG TERM GOALS: Target date: 03/20/2024  Patient will tolerate five consecutive stands with UE support from wheelchair to standing to improve functional mobility with additional cushion and RW.  Baseline: unable to perform 4/8:unable to perform full stand 5/20: perform next session 5/29: two full stands with CGA and cushion on her seat 7/31: able to stand 3 times from [facility]. With no addition cushion  in parallel bars. 12/27/2023= Will retest next session- concentrated on just standing on past 2 visits Goal status: Ongoing   2.  Patient will ambulate 10 ft with bRW with wheelchair follow.  Baseline:  unable to ambulate 4/8: unable to ambulate 5/20: able to ambulate length of // bars in previous session 5/27: ambulate 2.5 lengths of // bars with two seated rest breaks with min A for weight shift and wheelchair follow  7/31: able to  ambulate in parallel bars 3 ft with min assist and +2 follow in WC> difficulty advancing the LLE on this day. 12/27/2023- Patient unable to take a step forward today but was abel to take a step on previous visit- limited by clonus like trembling that has progressed over past month. Goal status: Ongoing   3.  Patient will improve FIST score >6 points to demonstrate improved stability and ability to perform ADLs.  Baseline:  3/6: 39/56 4/8: 47/ 56 5/20: 48/56 7/31:46/56 Goal status: MET  4.  Patient will perform toileting with mod I at home for improved independence.  Baseline: 3/6: requires assistance 4/8: requires dependence on machine 5/20: requires assistance with use of wipe buddy.  7/31: continues to require use of sara steady and wipe buddy for safety and hygiene. 12/27/2023- Patient reports that since she is unable to take steps that she continues to require use of sara steady and wipe buddy for safe personal hygiene. Goal status: ongoing   5. Patient will perform > 5 min of static or dynamic standing with BUE and Min/mod assist  for improved LE strength and pre-gait function to assist with toileting and overall standing ability for improve functional independence.  Baseline: 12/27/2023-Patient was able to stand for just over 3 min today. 01/10/24: Pt able to stand 1 min 38 sec Goal status: NOT PROGRESSING    ASSESSMENT:  CLINICAL IMPRESSION:  Pt performed extremely well with ambulation attempt and was able to ambulate further than she has been able to in the past year.  Pt also understanding of her limitations and when she becomes too fatigued to continue in which she is able to voice to therapist and assistant in preparation of sitting into the wheelchair.  Pt ultimately is making great progress and will continue to benefit from skilled therapy to address remaining deficits in order to improve overall QoL and return to PLOF.         OBJECTIVE IMPAIRMENTS: Abnormal gait,  cardiopulmonary status limiting activity, decreased activity tolerance, decreased balance, decreased coordination, decreased endurance, decreased mobility, difficulty walking, decreased ROM, decreased strength, hypomobility, increased fascial restrictions, impaired perceived functional ability, impaired flexibility, impaired sensation, improper body mechanics, and postural dysfunction.   ACTIVITY LIMITATIONS: carrying, lifting, bending, sitting,  standing, squatting, sleeping, stairs, transfers, bed mobility, continence, bathing, toileting, dressing, self feeding, reach over head, hygiene/grooming, locomotion level, and caring for others  PARTICIPATION LIMITATIONS: meal prep, cleaning, laundry, interpersonal relationship, driving, shopping, community activity, and church  PERSONAL FACTORS: Age, Past/current experiences, Time since onset of injury/illness/exacerbation, Transportation, and 3+ comorbidities: ack pain, CBP, chronic L shoulder pain, galactorrhea, neuropathy, HPV, hypercholestermeia, HTN, MS, osteopenia, PONV, wrist fracture are also affecting patient's functional outcome.   REHAB POTENTIAL: Good  CLINICAL DECISION MAKING: Evolving/moderate complexity  EVALUATION COMPLEXITY: Moderate  PLAN:  PT FREQUENCY: 2x/week  PT DURATION: 12 weeks  PLANNED INTERVENTIONS: 97164- PT Re-evaluation, 97110-Therapeutic exercises, 97530- Therapeutic activity, 97112- Neuromuscular re-education, 97535- Self Care, [id number]- Manual therapy, [phone]- Gait training, [phone]- Orthotic Fit/training, [phone]- Canalith repositioning, [id number]- Aquatic Therapy, [phone]- Electrical stimulation (unattended), [phone]- Electrical stimulation (manual), [id number]- Ultrasound, [id number]- Traction (mechanical), Patient/Family education, Balance training, Stair training, Taping, Dry Needling, Joint mobilization, Joint manipulation, Spinal manipulation, Spinal mobilization, Scar mobilization, Compression bandaging, Vestibular training,  Visual/preceptual remediation/compensation, Cognitive remediation, DME instructions, Cryotherapy, Moist heat, and Biofeedback  PLAN FOR NEXT SESSION:   Strap stand frame with more dynamic activities Sit to stands; try supported walking, continue dynamic core stabilization training and seated reaching tasks to improve weight shifting.    Fonda Simpers, PT, DPT Physical Therapist - [facility]  01/31/24, 1:12 PM

## 2024-01-31 NOTE — Therapy (Signed)
 OUTPATIENT OCCUPATIONAL THERAPY NEURO TREATMENT NOTE  Patient Name: Lisa Williams File MRN: [id number] DOB:[birth year], 63 y.o., female Today's Date: 01/31/2024  PCP: Dr. Layman Williams   Neurologist Dr. Suanne at [facility] REFERRING PROVIDER: Dr. Layman Williams    END OF SESSION:  OT End of Session - 01/31/24 1220     Visit Number 42    Number of Visits 44    Date for Recertification  02/05/24    Authorization Time Period Reporting period beginning 01/24/24    Progress Note Due on Visit 50    OT Start Time 1100    OT Stop Time 1145    OT Time Calculation (min) 45 min    Equipment Utilized During Treatment manual wc    Activity Tolerance Patient tolerated treatment well    Behavior During Therapy [name] for tasks assessed/performed         Past Medical History:  Diagnosis Date   Abdominal pain, right upper quadrant    Back pain    Calculus of kidney 12/09/2013   Chronic back pain    unspecified   Chronic left shoulder pain 07/19/2015   Complication of anesthesia    Functional disorder of bladder    other   Galactorrhea 11/26/2014   Chronic    Hereditary and idiopathic neuropathy 08/19/2013   History of kidney stones    HPV test positive    Hypercholesteremia 08/19/2013   Hypertension    Incomplete bladder emptying    Microscopic hematuria    MS (multiple sclerosis)    Muscle spasticity 05/21/2014   Nonspecific findings on examination of urine    other   Osteopenia    PONV (postoperative nausea and vomiting)    Status post laparoscopic supracervical hysterectomy 11/26/2014   Tobacco user 11/26/2014   Wrist fracture    Past Surgical History:  Procedure Laterality Date   bilateral tubal ligation  1996   BREAST CYST EXCISION Left 2002   CYST EXCISION Left 05/10/2022   Procedure: CYST REMOVAL;  Surgeon: Lisa Romano, MD;  Location: [facility] ORS;  Service: General;  Laterality: Left;   FRACTURE SURGERY     KNEE SURGERY     right   LAPAROSCOPIC  SUPRACERVICAL HYSTERECTOMY  08/05/2013   ORIF WRIST FRACTURE Left 01/17/2017   Procedure: OPEN REDUCTION INTERNAL FIXATION (ORIF) WRIST FRACTURE;  Surgeon: Lisa Lynwood SAUNDERS, MD;  Location: [facility] ORS;  Service: Orthopedics;  Laterality: Left;   RADIOLOGY WITH ANESTHESIA N/A 03/18/2020   Procedure: MRI WITH ANESTHESIA CERVICAL SPINE AND BRAIN  WITH AND WITHOUT CONTRAST;  Surgeon: Radiologist, Medication, MD;  Location: [facility] OR;  Service: Radiology;  Laterality: N/A;   TUBAL LIGATION Bilateral    VAGINAL HYSTERECTOMY  03/2006   Patient Active Problem List   Diagnosis Date Noted   Hyperglycemia 01/09/2024   Abnormal LFTs (liver function tests) 09/17/2023   Sinus tachycardia 07/07/2023   Aortic atherosclerosis 01/18/2023   Prediabetes 01/18/2023   Hypokalemia 07/29/2022   Weakness of both lower extremities 07/29/2022   Abscess of left groin 11/15/2021   Abnormal LFTs 11/15/2021   Acute respiratory disease due to COVID-19 virus 11/21/2020   Weakness    Hypoalbuminemia due to protein-calorie malnutrition    Neurogenic bowel    Neurogenic bladder    Labile blood pressure    Neuropathic pain    Multiple sclerosis, relapsing-remitting 07/09/2019   Abscess of female pelvis    SVT (supraventricular tachycardia)    Radial styloid tenosynovitis 03/12/2018   Wheelchair dependence 02/27/2018   Localized  osteoporosis with current pathological fracture with routine healing 01/19/2017   Wrist fracture 01/16/2017   Sprain of ankle 03/23/2016   Closed fracture of lateral malleolus 03/16/2016   Health care maintenance 01/24/2016   Blood pressure elevated without history of HTN 10/25/2015   Essential hypertension 10/25/2015   Multiple sclerosis 10/02/2015   Chronic left shoulder pain 07/19/2015   Multiple sclerosis exacerbation 07/14/2015   MS (multiple sclerosis) 11/26/2014   Increased body mass index 11/26/2014   HPV test positive 11/26/2014   Status post laparoscopic supracervical hysterectomy  11/26/2014   Galactorrhea 11/26/2014   Back ache 05/21/2014   Adiposity 05/21/2014   Disordered sleep 05/21/2014   Muscle spasticity 05/21/2014   Spasticity 05/21/2014   Calculus of kidney 12/09/2013   Renal colic 12/09/2013   Hypercholesteremia 08/19/2013   Hereditary and idiopathic neuropathy 08/19/2013   Hypercholesterolemia without hypertriglyceridemia 08/19/2013   Bladder infection, chronic 07/25/2012   Disorder of bladder function 07/25/2012   Incomplete bladder emptying 07/25/2012   Microscopic hematuria 07/25/2012   Right upper quadrant pain 07/25/2012   ONSET DATE: 5/24 [place] and broke R leg which caused beginning of ADL decline; diagnosed with MS in 1995)  REFERRING DIAG: MS  THERAPY DIAG:  Muscle weakness (generalized)  Other lack of coordination  Multiple sclerosis exacerbation  Rationale for Evaluation and Treatment: Rehabilitation  SUBJECTIVE:  SUBJECTIVE STATEMENT: Pt reports that she wants to work on her triceps to help ease her sit to stands with PT. Pt accompanied by: self  PERTINENT HISTORY: Pt reports increased difficulty with basic self care tasks since breaking her R leg last May.  Since that time, mobility and ADLs have declined, with pt requiring assist from private caregivers and family to manage bathing, toileting, dressing, and functional transfers.  PRECAUTIONS: Fall  WEIGHT BEARING RESTRICTIONS: No  PAIN: 01/31/24: No pain  Are you having pain? No; occasional pain in R lower back, but not today  FALLS: Has patient fallen in last 6 months? No Last fall was May 17 of 2024  LIVING ENVIRONMENT: Lives with: lives with their family (including mother who has dementia and sister Lisa Williams)  Lives in: 2 level home but pt resides on main level Stairs: ramp  Has following equipment at home: Wheelchair (manual), Shower bench, and hand held shower shower, 1 grab bar in the shower, 3in1 commode, sit to stand lift (electric), FWW, hospital bed  PLOF:  modified indep with ADL/IADLs prior to May of 7975   PATIENT GOALS: Increase independence with basic self care tasks  OBJECTIVE:  Note: Objective measures were completed at Evaluation unless otherwise noted.  HAND DOMINANCE: Right  ADLs:  Overall ADLs: assist provided from paid caregiver and sister Transfers/ambulation related to ADLs: sit to stand lift for all transfers, set up for sliding board in/out of bed  Eating: indep  Grooming: modified indep (wc level in mom's bathroom)  UB Dressing: set up (can't access her bedroom closet)  LB Dressing: able to sit on side of bed to don all LB clothing; assist to hike pants/underwear Toileting: assist with clothing management d/t standing within electric lift Bathing: bed bath with sister helping to wash backside Tub Shower transfers: N/A; pt has walk in shower in mom's bathroom (wc does fit in this bathroom but pt reports inability to transfer from wc and requires arm rests on a bench for a successful transfer attempt from any DME).  Tub bench is in pt's bathroom, but lift does not fit through bathroom doorway and pt reports inability  to transfer up from tub bench (currently unable to manage either transfer)   Equipment: see above  IADLs: Shopping: [employer] for shopping Light housekeeping: modified indep from wc level Meal Prep: modified indep from wc level Community mobility: relies on community transportation or family members Medication management: indep Landscape architect: indep Handwriting: NT; pt denies any FMC challenges  MOBILITY STATUS: Hx of falls  POSTURE COMMENTS:  Rounded shoulders, forward head, anterior pelvic tilt, and weight shift left   ACTIVITY TOLERANCE: Eval: Activity tolerance: Per PT; currently standing 30-60 sec within Light Gait harness/lift; currently non-ambulatory  01/24/24: Standing at sink: 1 minute; min A and 5 attempts to achieve fully erect standing position with BUEs supported on sink countertop,  OT assist to block R knee to prevent buckling   UPPER EXTREMITY ROM:  BUEs WFL  UPPER EXTREMITY MMT:     MMT Right eval Left eval  Shoulder flexion 4+ 4  Shoulder abduction 4+ 4  Shoulder adduction    Shoulder extension    Shoulder internal rotation 4+ 4+  Shoulder external rotation 4 4  Middle trapezius    Lower trapezius    Elbow flexion 4+ 4+  Elbow extension 4+ 4+  Wrist flexion 4+ 4+  Wrist extension 4+ 4+  Wrist ulnar deviation    Wrist radial deviation    Wrist pronation    Wrist supination    (Blank rows = not tested)  HAND FUNCTION/COORDINATION:  R/L WNL; pt denies any coordination deficits/ able to manipulate pills/clothing fasteners/etc without difficulty  11/15/23: *Measures taken d/t PT note revealing pt with increased difficulty manipulating smaller ADL supplies during reaching activities in PT session   11/15/23: Grip strength: R: 70 lbs, L: 63 lbs Pinch strength: Lateral: R: 16 lbs, L: 15 lbs; 3 point pinch: R: 12 lbs, L: 13 lbs 9 hole peg test: Right: 25 sec, L 49 sec   12/25/23: Grip strength: Right: 70 lbs; Left: 65 lbs  9 hole peg test: Right: 24 sec; Left: 3 trials: 54 sec, 1 min, 56 sec  01/24/24: Grip grip: Right: 76 lbs; Left: 67 lbs Lateral pinch: R: 17 lbs, L: 17 lbs; 3 point pinch: R: 13 lbs, L: 14 lbs  9 Hole Peg Test: R: 26 sec, L: 50 sec  SENSATION: WFL  EDEMA: No visible edema in BUEs  MUSCLE TONE: BUEs WNL  COGNITION: Overall cognitive status: Within functional limits for tasks assessed  VISION: wears glasses all the time, no reports of diplopia  PERCEPTION: Not tested  PRAXIS: WFL  OBSERVATIONS:  Pt pleasant, cooperative, and motivated to work towards improving indep with ADLs.                                                                                                     TREATMENT DATE: 01/31/24 Therapeutic Exercise: -B triceps strengthening: chair push ups x7 feet supported on floor directly beneath knees; demonstrated  with feet forward for increased challenge/less reliance on Les; able to perform 5 reps -Blue theraband: -Triceps 3 ways, elbow ext with reach to floor, forward reach, reach over head;  min tactile/vc for positioning    -Completed 10 reps each position noted above in R and LUEs  Therapeutic Activity: -Facilitated L hand FMC/dexterity skills, targeting storage and translatory skills, moving objects from palm to fingertips and repositioning items within fingertips.  Pt worked with alphabet dice to target above noted skills, working with 3-5 die in hand at a time.  Same skills targeted with grooved pegs, working with 1-2 in L hand at a time.    PATIENT EDUCATION: Education details: HEP progression Person educated: Patient Education method: Programmer, multimedia, demo Education comprehension: verbalized understanding, demonstrated understanding  HOME EXERCISE PROGRAM: IR A/AAROM to promote shoulder flexibility for peri care/bathing  GOALS: Goals reviewed with patient? Yes  SHORT TERM GOALS: Target date: 12/25/23  Pt will perform bed bath with set up only. Baseline: Eval: Min-mod A for posterior washing; 09/28/23: Min A for posterior washing; 11/13/23: Able to manage bed bath but sister helps with thoroughness, per pt's preference; pt in agreement to trial bath sitting EOB or in wc before next OT session. Goal status: achieved/d/c  2.  Pt will utilize sliding board for wc<>drop arm commode transfer with SBA. Baseline: Eval: Currently using electric lift for transfer to [facility]; 09/28/23: Not yet completed at home but transfers to commode have been easier without sliding board d/t pt implementing a scoot pivot. Goal status: d/c (pt does not prefer sliding board for commode transfer)  3.  Pt will be indep to perform HEP for maintaining BUE strength for ADLs and functional transfers. Baseline: Eval: HEP not yet initiated; 09/28/23: Pt is working on core stability exercises from wc, cane stretches for bilat shoulder  IR, and tricep extension via wc pushups; 11/13/23: indep Goal status: achieved  LONG TERM GOALS: Target date: 02/05/24  Pt will perform sink bath with set up A. (Revised on 11/13/23 from min A to set up) Baseline: Eval: Currently bed bathing d/t inability to stand at sink without electric lift (lift does not fit into pt's bathroom); 09/28/23: still completing bed bath as pt is not yet able to stand; 11/13/23: Not yet attempted; pt encouraged to attempt before next OT session now that bed bath goal has been met; 12/20/23: Pt is now completing sponge bath with min A while seated EOB (caregiver assists with washing peri area d/t no bed rail to aid in R lateral lean); 01/24/24: Performing with set up on EOB, completing lateral weight shifts to wash posterior/peri area with wc in front of pt and bed rail for LUE support and foot of bed for RUE support  Goal status: achieved  2.  Pt will perform wc<>tub bench transfer with min A. Baseline: Eval: Unable; 09/28/23: Performed in OT clinic with min A, but not yet in the home Goal status: d/c (pt prefers sink bath)  3.  Pt will perform squat pivot transfer wc<>BSC with SBA.  Baseline: Eval: Eval: Currently using electric lift for transfer to [facility]; 09/28/23: Not yet completed in clinic with [facility], and using lift for [facility] in the home still; 11/13/23: Still using Camie lift at home and in clinic transfers are all scoot rather than squat pivots d/t LE weakness; 12/20/23: Pt reports that she plans to use her Camie lift until she can ambulate to her [facility] d/t limited space for wc in her desired location for [facility] at home. Goal status: d/c  4. Pt will perform scoot transfer wc<>toilet using grab bars with supv to allow toileting in community setting.  Baseline: Eval: Pt limits community outings d/t  requiring use of lift for [facility] transfers; 09/28/23: Pt has performed 2 successful scoot pivot  Transfers to toilet in OT clinic with min A, extra time.  Further trials with clothing management  on toilet needed, but pt can lower and hike  pants while sititng edge of mat via lateral leaning and min A to maintain core stability; 11/13/23: Pt performs with CGA and extra time; 12/19/23: Pt  performs with close supv on most attempts, occasional CGA; efficiency is improving; 01/24/24: ongoing improvements; able to complete in fewer scoots (3 scoots each way from wc<>toilet) d/t improve engagement of Les and improved anterior weight shift; occasional min guard-supv; pt completes with therapist in clinic, but not yet in other community settings with sister, and not yet voided on toilet in community    Goal status: ongoing  5.  Pt will complete seated clothing management with min A to enable voiding in handicapped stall within community. Baseline: Recert 11/13/23: Pt can transfer to toilet with increased time and effort, but has not yet attempted clothing management or voiding while seated on commode in community setting.  Pt can lower pants to knees while seated in wc, but requires mod A to hike over hips from seated position; 12/20/23: Pt fully lowers panties and shorts while seated on commode with supv, can hike clothing to her her upper thighs, transfers back to wc, and then performs a tricep extension from wc level to allow caregiver to hike clothing remainder of the way for purposes of EC (min A for clothing management component) Goal status: achieved  6.  Pt will tolerate standing x1 min with close supv and BUE support to manage clothing in prep for toileting. Baseline: Eval: Currently standing in electric lift only, or within parallel bars without lift; 09/28/23: Not yet attempted; 11/13/23: Pt is able to perform static standing with heavy BUE support on parallel bars with PT; 12/19/22: Not attempted recently d/t new intermittent clonus in BLEs; 01/24/24: Tolerated standing 1 min today at sink with min A for ascent, OT assist to block R knee from buckling Goal status: in progress  7.  Pt will increase  bilat grip strength in order to securely grasp walker sufficiently for functional transfers and ambulation.  Baseline: Recert 11/13/23: TBD (PT note read following OT session, indicating pt with difficulty securing items in hand);  11/15/23: Grip strength: R: 70 lbs, L: 63 lbs; 12/25/23: R 76 lbs, L 67 lbs; sufficient for functional transfers/amb  Goal status: achieved  8.  Pt will increase L [facility] skills in order to manipulate small ADL supplies with reduced dropping as indicated by 20 sec or more improvement in 9 hole peg test. Baseline: Recert 11/13/23: 9 hole peg test TBD (PT note read following OT session, indicating pt with difficulty manipulating and securing items in hands); 11/15/23: Right: 25 sec, L 49 sec; 12/25/23: Right: 24 sec; Left: 3 trials: 54 sec, 1 min, 56 sec; 01/24/24: R: 26 sec, L: 50 sec  Goal status: in progress  ASSESSMENT: CLINICAL IMPRESSION: Good tolerance to BUE strengthening this date with focus on triceps strengthening for easing STS transfers.  Pt able to return demo of 5 ways to target triceps strengthening using wc or Tband in the home.  Continued focus on L hand FMC/dexterity skills, with pt demonstrating difficulty with item storage and translatory skills, tending to drop items from ulnar side of L hand when working with multiple items at once.  Pt will continue to benefit from skilled OT to work  towards above noted goals in OT poc, working to maximize indep with daily tasks while reducing burden of care on caregivers.   PERFORMANCE DEFICITS: in functional skills including ADLs, IADLs, strength, pain, flexibility, Gross motor control, mobility, balance, body mechanics, endurance, and decreased knowledge of use of DME, and psychosocial skills including coping strategies, environmental adaptation, habits, and routines and behaviors.   IMPAIRMENTS: are limiting patient from ADLs, IADLs, and social participation.   CO-MORBIDITIES: has co-morbidities such as neuropathy, back  pain, obesity, MS, HTN that affects occupational performance. Patient will benefit from skilled OT to address above impairments and improve overall function.  MODIFICATION OR ASSISTANCE TO COMPLETE EVALUATION: No modification of tasks or assist necessary to complete an evaluation.  OT OCCUPATIONAL PROFILE AND HISTORY: Detailed assessment: Review of records and additional review of physical, cognitive, psychosocial history related to current functional performance.  CLINICAL DECISION MAKING: Moderate - several treatment options, min-mod task modification necessary  REHAB POTENTIAL: Good  EVALUATION COMPLEXITY: Moderate  PLAN:  OT FREQUENCY: 2x/week  OT DURATION: 12 weeks  PLANNED INTERVENTIONS: 97168 OT Re-evaluation, 97535 self care/ADL training, 02889 therapeutic exercise, 97530 therapeutic activity, 97112 neuromuscular re-education, 97140 manual therapy, 97116 gait training, 02989 moist heat, 97010 cryotherapy, balance training, functional mobility training, psychosocial skills training, energy conservation, coping strategies training, patient/family education, and DME and/or AE instructions  RECOMMENDED OTHER SERVICES: None at this time (Pt currently receiving PT services in this clinic)  CONSULTED AND AGREED WITH PLAN OF CARE: Patient  PLAN FOR NEXT SESSION: see above  Inocente Blazing, MS, OTR/L  Inocente MARLA Blazing, OT 01/31/2024, 12:21 PM

## 2024-02-05 ENCOUNTER — Ambulatory Visit: Payer: PPO

## 2024-02-05 ENCOUNTER — Ambulatory Visit

## 2024-02-05 DIAGNOSIS — M6281 Muscle weakness (generalized): Secondary | ICD-10-CM

## 2024-02-05 DIAGNOSIS — G35D Multiple sclerosis, unspecified: Secondary | ICD-10-CM

## 2024-02-05 DIAGNOSIS — R278 Other lack of coordination: Secondary | ICD-10-CM

## 2024-02-05 DIAGNOSIS — R2681 Unsteadiness on feet: Secondary | ICD-10-CM

## 2024-02-05 DIAGNOSIS — R269 Unspecified abnormalities of gait and mobility: Secondary | ICD-10-CM

## 2024-02-05 DIAGNOSIS — R262 Difficulty in walking, not elsewhere classified: Secondary | ICD-10-CM

## 2024-02-05 DIAGNOSIS — R2689 Other abnormalities of gait and mobility: Secondary | ICD-10-CM

## 2024-02-05 NOTE — Therapy (Signed)
 OUTPATIENT OCCUPATIONAL THERAPY NEURO TREATMENT NOTE  Patient Name: Jamara Vary Coffin MRN: [id number] DOB:05-05-1961, 63 y.o., female Today's Date: 02/05/2024  PCP: Dr. Layman Piety   Neurologist Dr. Suanne at [facility] REFERRING PROVIDER: Dr. Layman Piety    END OF SESSION:  OT End of Session - 02/05/24 1100     Visit Number 43    Number of Visits 67    Date for Recertification  04/29/24    Progress Note Due on Visit 50    OT Start Time 1100    OT Stop Time 1145    OT Time Calculation (min) 45 min    Equipment Utilized During Treatment manual wc    Activity Tolerance Patient tolerated treatment well    Behavior During Therapy [facility] for tasks assessed/performed         Past Medical History:  Diagnosis Date   Abdominal pain, right upper quadrant    Back pain    Calculus of kidney 12/09/2013   Chronic back pain    unspecified   Chronic left shoulder pain 07/19/2015   Complication of anesthesia    Functional disorder of bladder    other   Galactorrhea 11/26/2014   Chronic    Hereditary and idiopathic neuropathy 08/19/2013   History of kidney stones    HPV test positive    Hypercholesteremia 08/19/2013   Hypertension    Incomplete bladder emptying    Microscopic hematuria    MS (multiple sclerosis)    Muscle spasticity 05/21/2014   Nonspecific findings on examination of urine    other   Osteopenia    PONV (postoperative nausea and vomiting)    Status post laparoscopic supracervical hysterectomy 11/26/2014   Tobacco user 11/26/2014   Wrist fracture    Past Surgical History:  Procedure Laterality Date   bilateral tubal ligation  1996   BREAST CYST EXCISION Left 2002   CYST EXCISION Left 05/10/2022   Procedure: CYST REMOVAL;  Surgeon: Rodolph Romano, MD;  Location: [facility] ORS;  Service: General;  Laterality: Left;   FRACTURE SURGERY     KNEE SURGERY     right   LAPAROSCOPIC SUPRACERVICAL HYSTERECTOMY  08/05/2013   ORIF WRIST FRACTURE Left  01/17/2017   Procedure: OPEN REDUCTION INTERNAL FIXATION (ORIF) WRIST FRACTURE;  Surgeon: Leora Lynwood SAUNDERS, MD;  Location: [facility] ORS;  Service: Orthopedics;  Laterality: Left;   RADIOLOGY WITH ANESTHESIA N/A 03/18/2020   Procedure: MRI WITH ANESTHESIA CERVICAL SPINE AND BRAIN  WITH AND WITHOUT CONTRAST;  Surgeon: Radiologist, Medication, MD;  Location: [facility] OR;  Service: Radiology;  Laterality: N/A;   TUBAL LIGATION Bilateral    VAGINAL HYSTERECTOMY  03/2006   Patient Active Problem List   Diagnosis Date Noted   Hyperglycemia 01/09/2024   Abnormal LFTs (liver function tests) 09/17/2023   Sinus tachycardia 07/07/2023   Aortic atherosclerosis 01/18/2023   Prediabetes 01/18/2023   Hypokalemia 07/29/2022   Weakness of both lower extremities 07/29/2022   Abscess of left groin 11/15/2021   Abnormal LFTs 11/15/2021   Acute respiratory disease due to COVID-19 virus 11/21/2020   Weakness    Hypoalbuminemia due to protein-calorie malnutrition    Neurogenic bowel    Neurogenic bladder    Labile blood pressure    Neuropathic pain    Multiple sclerosis, relapsing-remitting 07/09/2019   Abscess of female pelvis    SVT (supraventricular tachycardia)    Radial styloid tenosynovitis 03/12/2018   Wheelchair dependence 02/27/2018   Localized osteoporosis with current pathological fracture with routine healing 01/19/2017  Wrist fracture 01/16/2017   Sprain of ankle 03/23/2016   Closed fracture of lateral malleolus 03/16/2016   Health care maintenance 01/24/2016   Blood pressure elevated without history of HTN 10/25/2015   Essential hypertension 10/25/2015   Multiple sclerosis 10/02/2015   Chronic left shoulder pain 07/19/2015   Multiple sclerosis exacerbation 07/14/2015   MS (multiple sclerosis) 11/26/2014   Increased body mass index 11/26/2014   HPV test positive 11/26/2014   Status post laparoscopic supracervical hysterectomy 11/26/2014   Galactorrhea 11/26/2014   Back ache 05/21/2014    Adiposity 05/21/2014   Disordered sleep 05/21/2014   Muscle spasticity 05/21/2014   Spasticity 05/21/2014   Calculus of kidney 12/09/2013   Renal colic 12/09/2013   Hypercholesteremia 08/19/2013   Hereditary and idiopathic neuropathy 08/19/2013   Hypercholesterolemia without hypertriglyceridemia 08/19/2013   Bladder infection, chronic 07/25/2012   Disorder of bladder function 07/25/2012   Incomplete bladder emptying 07/25/2012   Microscopic hematuria 07/25/2012   Right upper quadrant pain 07/25/2012   ONSET DATE: 5/24 [place] and broke R leg which caused beginning of ADL decline; diagnosed with MS in 1995)  REFERRING DIAG: MS  THERAPY DIAG:  Muscle weakness (generalized)  Other lack of coordination  Multiple sclerosis exacerbation  Rationale for Evaluation and Treatment: Rehabilitation  SUBJECTIVE:  SUBJECTIVE STATEMENT: Pt reports she enjoyed her family gathering over the weekend. Pt accompanied by: self, pt's mother  PERTINENT HISTORY: Pt reports increased difficulty with basic self care tasks since breaking her R leg last May.  Since that time, mobility and ADLs have declined, with pt requiring assist from private caregivers and family to manage bathing, toileting, dressing, and functional transfers.  PRECAUTIONS: Fall  WEIGHT BEARING RESTRICTIONS: No  PAIN: 02/05/24: No pain  Are you having pain? No; occasional pain in R lower back, but not today  FALLS: Has patient fallen in last 6 months? No Last fall was May 17 of 2024  LIVING ENVIRONMENT: Lives with: lives with their family (including mother who has dementia and sister [name])  Lives in: 2 level home but pt resides on main level Stairs: ramp  Has following equipment at home: Wheelchair (manual), Shower bench, and hand held shower shower, 1 grab bar in the shower, 3in1 commode, sit to stand lift (electric), FWW, hospital bed  PLOF: modified indep with ADL/IADLs prior to May of 7975   PATIENT GOALS: Increase  independence with basic self care tasks  OBJECTIVE:  Note: Objective measures were completed at Evaluation unless otherwise noted.  HAND DOMINANCE: Right  ADLs:  Overall ADLs: assist provided from paid caregiver and sister Transfers/ambulation related to ADLs: sit to stand lift for all transfers, set up for sliding board in/out of bed  Eating: indep  Grooming: modified indep (wc level in mom's bathroom)  UB Dressing: set up (can't access her bedroom closet)  LB Dressing: able to sit on side of bed to don all LB clothing; assist to hike pants/underwear Toileting: assist with clothing management d/t standing within electric lift Bathing: bed bath with sister helping to wash backside Tub Shower transfers: N/A; pt has walk in shower in mom's bathroom (wc does fit in this bathroom but pt reports inability to transfer from wc and requires arm rests on a bench for a successful transfer attempt from any DME).  Tub bench is in pt's bathroom, but lift does not fit through bathroom doorway and pt reports inability to transfer up from tub bench (currently unable to manage either transfer)   Equipment: see above  IADLs: Shopping: [employer] for shopping Light housekeeping: modified indep from wc level Meal Prep: modified indep from wc level Community mobility: relies on community transportation or family members Medication management: indep Landscape architect: indep Handwriting: NT; pt denies any [facility] challenges  MOBILITY STATUS: Hx of falls  POSTURE COMMENTS:  Rounded shoulders, forward head, anterior pelvic tilt, and weight shift left   ACTIVITY TOLERANCE: Eval: Activity tolerance: Per PT; currently standing 30-60 sec within Light Gait harness/lift; currently non-ambulatory  01/24/24: Standing at sink: 1 minute; min A and 5 attempts to achieve fully erect standing position with BUEs supported on sink countertop, OT assist to block R knee to prevent buckling   UPPER EXTREMITY ROM:  BUEs  WFL  UPPER EXTREMITY MMT:     MMT Right eval Left eval  Shoulder flexion 4+ 4  Shoulder abduction 4+ 4  Shoulder adduction    Shoulder extension    Shoulder internal rotation 4+ 4+  Shoulder external rotation 4 4  Middle trapezius    Lower trapezius    Elbow flexion 4+ 4+  Elbow extension 4+ 4+  Wrist flexion 4+ 4+  Wrist extension 4+ 4+  Wrist ulnar deviation    Wrist radial deviation    Wrist pronation    Wrist supination    (Blank rows = not tested)  HAND FUNCTION/COORDINATION:  R/L WNL; pt denies any coordination deficits/ able to manipulate pills/clothing fasteners/etc without difficulty  11/15/23: *Measures taken d/t PT note revealing pt with increased difficulty manipulating smaller ADL supplies during reaching activities in PT session   11/15/23: Grip strength: R: 70 lbs, L: 63 lbs Pinch strength: Lateral: R: 16 lbs, L: 15 lbs; 3 point pinch: R: 12 lbs, L: 13 lbs 9 hole peg test: Right: 25 sec, L 49 sec   12/25/23: Grip strength: Right: 70 lbs; Left: 65 lbs  9 hole peg test: Right: 24 sec; Left: 3 trials: 54 sec, 1 min, 56 sec  01/24/24: Grip grip: Right: 76 lbs; Left: 67 lbs Lateral pinch: R: 17 lbs, L: 17 lbs; 3 point pinch: R: 13 lbs, L: 14 lbs  9 Hole Peg Test: R: 26 sec, L: 50 sec  SENSATION: WFL  EDEMA: No visible edema in BUEs  MUSCLE TONE: BUEs WNL  COGNITION: Overall cognitive status: Within functional limits for tasks assessed  VISION: wears glasses all the time, no reports of diplopia  PERCEPTION: Not tested  PRAXIS: WFL  OBSERVATIONS:  Pt pleasant, cooperative, and motivated to work towards improving indep with ADLs.                                                                                                     TREATMENT DATE: 02/05/24 Self Care: -STS attempts at sink x4 attempts, working towards standing ADLs.  Mod vc for forward weight shift, min A to implement.  Therapeutic Activity: -Facilitated core strengthening with high reps  of forward and lateral reaching patterns outside BOS.  Pt sat in wc without support of arm rests, reaching forward and laterally to place Connect 4 pieces into game board with return to  midline sitting after each rep.  Facilitated contralateral and ipsilateral reaching.   PATIENT EDUCATION: Education details: Company secretary Person educated: Patient Education method: Explanation, demo, min A Education comprehension: verbalized understanding and needs further education  HOME EXERCISE PROGRAM: IR A/AAROM to promote shoulder flexibility for peri care/bathing  GOALS: Goals reviewed with patient? Yes  SHORT TERM GOALS: Target date: 12/25/23  Pt will perform bed bath with set up only. Baseline: Eval: Min-mod A for posterior washing; 09/28/23: Min A for posterior washing; 11/13/23: Able to manage bed bath but sister helps with thoroughness, per pt's preference; pt in agreement to trial bath sitting EOB or in wc before next OT session. Goal status: achieved/d/c  2.  Pt will utilize sliding board for wc<>drop arm commode transfer with SBA. Baseline: Eval: Currently using electric lift for transfer to [facility]; 09/28/23: Not yet completed at home but transfers to commode have been easier without sliding board d/t pt implementing a scoot pivot. Goal status: d/c (pt does not prefer sliding board for commode transfer)  3.  Pt will be indep to perform HEP for maintaining BUE strength for ADLs and functional transfers. Baseline: Eval: HEP not yet initiated; 09/28/23: Pt is working on core stability exercises from wc, cane stretches for bilat shoulder IR, and tricep extension via wc pushups; 11/13/23: indep Goal status: achieved  LONG TERM GOALS: Target date: 02/05/24  Pt will perform sink bath with set up A. (Revised on 11/13/23 from min A to set up) Baseline: Eval: Currently bed bathing d/t inability to stand at sink without electric lift (lift does not fit into pt's bathroom); 09/28/23: still completing bed bath as  pt is not yet able to stand; 11/13/23: Not yet attempted; pt encouraged to attempt before next OT session now that bed bath goal has been met; 12/20/23: Pt is now completing sponge bath with min A while seated EOB (caregiver assists with washing peri area d/t no bed rail to aid in R lateral lean); 01/24/24: Performing with set up on EOB, completing lateral weight shifts to wash posterior/peri area with wc in front of pt and bed rail for LUE support and foot of bed for RUE support  Goal status: achieved  2.  Pt will perform wc<>tub bench transfer with min A. Baseline: Eval: Unable; 09/28/23: Performed in OT clinic with min A, but not yet in the home Goal status: d/c (pt prefers sink bath)  3.  Pt will perform squat pivot transfer wc<>BSC with SBA.  Baseline: Eval: Eval: Currently using electric lift for transfer to [facility]; 09/28/23: Not yet completed in clinic with [facility], and using lift for [facility] in the home still; 11/13/23: Still using Camie lift at home and in clinic transfers are all scoot rather than squat pivots d/t LE weakness; 12/20/23: Pt reports that she plans to use her Camie lift until she can ambulate to her [facility] d/t limited space for wc in her desired location for [facility] at home. Goal status: d/c  4. Pt will perform scoot transfer wc<>toilet using grab bars with supv to allow toileting in community setting.  Baseline: Eval: Pt limits community outings d/t requiring use of lift for [facility] transfers; 09/28/23: Pt has performed 2 successful scoot pivot  Transfers to toilet in OT clinic with min A, extra time.  Further trials with clothing management on toilet needed, but pt can lower and hike  pants while sititng edge of mat via lateral leaning and min A to maintain core stability; 11/13/23: Pt performs with CGA  and extra time; 12/19/23: Pt  performs with close supv on most attempts, occasional CGA; efficiency is improving; 01/24/24: ongoing improvements; able to complete in fewer scoots (3 scoots each way from  wc<>toilet) d/t improve engagement of Les and improved anterior weight shift; occasional min guard-supv; pt completes with therapist in clinic, but not yet in other community settings with sister, and not yet voided on toilet in community    Goal status: ongoing  5.  Pt will complete seated clothing management with min A to enable voiding in handicapped stall within community. Baseline: Recert 11/13/23: Pt can transfer to toilet with increased time and effort, but has not yet attempted clothing management or voiding while seated on commode in community setting.  Pt can lower pants to knees while seated in wc, but requires mod A to hike over hips from seated position; 12/20/23: Pt fully lowers panties and shorts while seated on commode with supv, can hike clothing to her her upper thighs, transfers back to wc, and then performs a tricep extension from wc level to allow caregiver to hike clothing remainder of the way for purposes of EC (min A for clothing management component) Goal status: achieved  6.  Pt will tolerate standing x1 min with close supv and BUE support to manage clothing in prep for toileting. Baseline: Eval: Currently standing in electric lift only, or within parallel bars without lift; 09/28/23: Not yet attempted; 11/13/23: Pt is able to perform static standing with heavy BUE support on parallel bars with PT; 12/19/22: Not attempted recently d/t new intermittent clonus in BLEs; 01/24/24: Tolerated standing 1 min today at sink with min A for ascent, OT assist to block R knee from buckling Goal status: in progress  7.  Pt will increase bilat grip strength in order to securely grasp walker sufficiently for functional transfers and ambulation.  Baseline: Recert 11/13/23: TBD (PT note read following OT session, indicating pt with difficulty securing items in hand);  11/15/23: Grip strength: R: 70 lbs, L: 63 lbs; 12/25/23: R 76 lbs, L 67 lbs; sufficient for functional transfers/amb  Goal status:  achieved  8.  Pt will increase L [facility] skills in order to manipulate small ADL supplies with reduced dropping as indicated by 20 sec or more improvement in 9 hole peg test. Baseline: Recert 11/13/23: 9 hole peg test TBD (PT note read following OT session, indicating pt with difficulty manipulating and securing items in hands); 11/15/23: Right: 25 sec, L 49 sec; 12/25/23: Right: 24 sec; Left: 3 trials: 54 sec, 1 min, 56 sec; 01/24/24: R: 26 sec, L: 50 sec  Goal status: in progress  ASSESSMENT: CLINICAL IMPRESSION: Able to achieve 75% of erect standing position at sink today on 1 of 4 attempts, and 50% on 3 of 4 attempts with min A and mod vc for forward weight shift.  Pt reports often struggling physically on rainy days like today, with OT providing positive reinforcement for her strong efforts, and will plan to attempt again next session.  Good tolerance to core strengthening this date with table top reaching activities.  Pt expressed muscle fatigue by end of session, but did well to perform reaching reps with no UE support for nearly 100% of reps to return to midline after extended reach outside BOS.  Pt does require extra time and effort for return to midline, and does well with slow reaching, with increased difficulty controlling a quick/unplanned reach outside BOS, such as to catch a ball.  Pt will continue to  benefit from skilled OT to work towards above noted goals in [employer], working to maximize indep with daily tasks while reducing burden of care on caregivers.   PERFORMANCE DEFICITS: in functional skills including ADLs, IADLs, strength, pain, flexibility, Gross motor control, mobility, balance, body mechanics, endurance, and decreased knowledge of use of DME, and psychosocial skills including coping strategies, environmental adaptation, habits, and routines and behaviors.   IMPAIRMENTS: are limiting patient from ADLs, IADLs, and social participation.   CO-MORBIDITIES: has co-morbidities such as  neuropathy, back pain, obesity, MS, HTN that affects occupational performance. Patient will benefit from skilled OT to address above impairments and improve overall function.  MODIFICATION OR ASSISTANCE TO COMPLETE EVALUATION: No modification of tasks or assist necessary to complete an evaluation.  OT OCCUPATIONAL PROFILE AND HISTORY: Detailed assessment: Review of records and additional review of physical, cognitive, psychosocial history related to current functional performance.  CLINICAL DECISION MAKING: Moderate - several treatment options, min-mod task modification necessary  REHAB POTENTIAL: Good  EVALUATION COMPLEXITY: Moderate  PLAN:  OT FREQUENCY: 2x/week  OT DURATION: 12 weeks  PLANNED INTERVENTIONS: 97168 OT Re-evaluation, 97535 self care/ADL training, 02889 therapeutic exercise, 97530 therapeutic activity, 97112 neuromuscular re-education, 97140 manual therapy, 97116 gait training, 02989 moist heat, 97010 cryotherapy, balance training, functional mobility training, psychosocial skills training, energy conservation, coping strategies training, patient/family education, and DME and/or AE instructions  RECOMMENDED OTHER SERVICES: None at this time (Pt currently receiving PT services in this clinic)  CONSULTED AND AGREED WITH PLAN OF CARE: Patient  PLAN FOR NEXT SESSION: see above  Inocente Blazing, MS, OTR/L  Inocente MARLA Blazing, OT 02/05/2024, 8:10 PM

## 2024-02-05 NOTE — Therapy (Signed)
 OUTPATIENT PHYSICAL THERAPY NEURO TREATMENT  Patient Name: Lisa Williams MRN: [id number] DOB:1960-07-01, 63 y.o., female Today's Date: 02/05/2024  PCP: Lenon Na  REFERRING PROVIDER: Lenon Na  END OF SESSION:  PT End of Session - 02/05/24 1040     Visit Number 57    Number of Visits 70    Date for Recertification  03/20/24   corrected   Progress Note Due on Visit 50    PT Start Time 1015    PT Stop Time 1100    PT Time Calculation (min) 45 min    Equipment Utilized During Treatment Gait belt    Activity Tolerance Patient tolerated treatment well;No increased pain    Behavior During Therapy [facility] for tasks assessed/performed          Past Medical History:  Diagnosis Date   Abdominal pain, right upper quadrant    Back pain    Calculus of kidney 12/09/2013   Chronic back pain    unspecified   Chronic left shoulder pain 07/19/2015   Complication of anesthesia    Functional disorder of bladder    other   Galactorrhea 11/26/2014   Chronic    Hereditary and idiopathic neuropathy 08/19/2013   History of kidney stones    HPV test positive    Hypercholesteremia 08/19/2013   Hypertension    Incomplete bladder emptying    Microscopic hematuria    MS (multiple sclerosis)    Muscle spasticity 05/21/2014   Nonspecific findings on examination of urine    other   Osteopenia    PONV (postoperative nausea and vomiting)    Status post laparoscopic supracervical hysterectomy 11/26/2014   Tobacco user 11/26/2014   Wrist fracture    Past Surgical History:  Procedure Laterality Date   bilateral tubal ligation  1996   BREAST CYST EXCISION Left 2002   CYST EXCISION Left 05/10/2022   Procedure: CYST REMOVAL;  Surgeon: Rodolph Romano, MD;  Location: [facility] ORS;  Service: General;  Laterality: Left;   FRACTURE SURGERY     KNEE SURGERY     right   LAPAROSCOPIC SUPRACERVICAL HYSTERECTOMY  08/05/2013   ORIF WRIST FRACTURE Left 01/17/2017    Procedure: OPEN REDUCTION INTERNAL FIXATION (ORIF) WRIST FRACTURE;  Surgeon: Leora Lynwood SAUNDERS, MD;  Location: [facility] OR[facility];  Service: Orthopedics;  Laterality: Left;   RADIOLOGY WITH ANESTHESIA N/A 03/18/2020   Procedure: MRI WITH ANESTHESIA CERVICAL SPINE AND BRAIN  WITH AND WITHOUT CONTRAST;  Surgeon: Radiologist, Medication, MD;  Location: [facility] OR;  Service: Radiology;  Laterality: N/A;   TUBAL LIGATION Bilateral    VAGINAL HYSTERECTOMY  03/2006   Patient Active Problem List   Diagnosis Date Noted   Hyperglycemia 01/09/2024   Abnormal LFTs (liver function tests) 09/17/2023   Sinus tachycardia 07/07/2023   Aortic atherosclerosis 01/18/2023   Prediabetes 01/18/2023   Hypokalemia 07/29/2022   Weakness of both lower extremities 07/29/2022   Abscess of left groin 11/15/2021   Abnormal LFTs 11/15/2021   Acute respiratory disease due to COVID-19 virus 11/21/2020   Weakness    Hypoalbuminemia due to protein-calorie malnutrition    Neurogenic bowel    Neurogenic bladder    Labile blood pressure    Neuropathic pain    Multiple sclerosis, relapsing-remitting 07/09/2019   Abscess of female pelvis    SVT (supraventricular tachycardia)    Radial styloid tenosynovitis 03/12/2018   Wheelchair dependence 02/27/2018   Localized osteoporosis with current pathological fracture with routine healing 01/19/2017   Wrist fracture 01/16/2017  Sprain of ankle 03/23/2016   Closed fracture of lateral malleolus 03/16/2016   Health care maintenance 01/24/2016   Blood pressure elevated without history of HTN 10/25/2015   Essential hypertension 10/25/2015   Multiple sclerosis 10/02/2015   Chronic left shoulder pain 07/19/2015   Multiple sclerosis exacerbation 07/14/2015   MS (multiple sclerosis) 11/26/2014   Increased body mass index 11/26/2014   HPV test positive 11/26/2014   Status post laparoscopic supracervical hysterectomy 11/26/2014   Galactorrhea 11/26/2014   Back ache 05/21/2014   Adiposity  05/21/2014   Disordered sleep 05/21/2014   Muscle spasticity 05/21/2014   Spasticity 05/21/2014   Calculus of kidney 12/09/2013   Renal colic 12/09/2013   Hypercholesteremia 08/19/2013   Hereditary and idiopathic neuropathy 08/19/2013   Hypercholesterolemia without hypertriglyceridemia 08/19/2013   Bladder infection, chronic 07/25/2012   Disorder of bladder function 07/25/2012   Incomplete bladder emptying 07/25/2012   Microscopic hematuria 07/25/2012   Right upper quadrant pain 07/25/2012    ONSET DATE: 1995  REFERRING DIAG: MS  THERAPY DIAG:  Muscle weakness (generalized)  Other lack of coordination  Multiple sclerosis exacerbation  Difficulty in walking, not elsewhere classified  Unsteadiness on feet  Abnormality of gait and mobility  Other abnormalities of gait and mobility  Rationale for Evaluation and Treatment: Rehabilitation  SUBJECTIVE:                                                                                                                                                                                             SUBJECTIVE STATEMENT:  Pt reports that she is ready to go!    Pt accompanied by: self  PERTINENT HISTORY:  Patient is returning to PT s/p hospitalization.  s/p ORIF of R tibia shaft fracture 09/23/2022. Patient has weakness in BLE with RLE>LLE. She drives with hand controls. Patient has been diagnosed with MS in 1995. PMH includes: back pain, CBP, chronic L shoulder pain, galactorrhea, neuropathy, HPV, hypercholestermeia, HTN, MS, osteopenia, PONV, wrist fracture. Additional order for other closed fracture of proximal end of R tibia with routine healing. Still has to use [employer].   PAIN:  Are you having pain? Occasional pain in RLE; primarily in knee  PRECAUTIONS: Fall  RED FLAGS: None   WEIGHT BEARING RESTRICTIONS: No  FALLS: Has patient fallen in last 6 months? No  LIVING ENVIRONMENT: Lives with: lives with their family Lives  in: House/apartment Stairs: ramp Has following equipment at home: Vannie - 2 wheeled, Wheelchair (manual), shower chair, and Grab bars  PLOF: Independent with household mobility with device  PATIENT GOALS: to get her independence back. To be able to get  into car, toilet, and get dressed independently  OBJECTIVE:  Note: Objective measures were completed at Evaluation unless otherwise noted.  DIAGNOSTIC FINDINGS: MRI of the brain 03/18/2020 showed multiple T2/FLAIR hyperintense foci in the periventricular, juxtacortical and deep white matter.  There were no infratentorial lesions noted.  None of the foci enhanced.   Due to severe claustrophobia, the study was done with conscious sedation in the hospital   COGNITION: Overall cognitive status: Within functional limits for tasks assessed   SENSATION: Lack of sensation in feet, loss in bilateral lateral aspect of knee  COORDINATION: Does not have the strength for functional LE heel slide test   MUSCLE TONE: BLE mild tone    POSTURE: rounded shoulders, forward head, anterior pelvic tilt, and weight shift left   LOWER EXTREMITY MMT:    MMT Right Eval Left Eval  Hip flexion 0.9 1.5  Hip extension    Hip abduction 1.8 2.6  Hip adduction 3.1 1.9  Hip internal rotation    Hip external rotation    Knee flexion 0.9 1.5  Knee extension 0.8 1.2  Ankle dorsiflexion    Ankle plantarflexion    Ankle inversion    Ankle eversion    (Blank rows = not tested)  BED MOBILITY:  Assess in future session due to limited time  TRANSFERS: Assistive device utilized: Bariatric RW with sit to stands, slide board to table  Sit to stand: unable to reach full stand Stand to sit: unable to reach full stand  Chair to chair: slide board with CGA   GAIT: Unable to ambulate at this time.   FUNCTIONAL TESTS:  Sit to stand: tricep press with BUE; unable to bring arm to walker.    Function In Sitting Test (FIST)  (1/2 femur on surface; hips/knees  flexed to 90deg)   - indicate bed or mat table / step stool if used  SCORING KEY: 4 = Independent (completes task independently & successfully) 3 = Verbal Cues/Increased Time (completes task independently & successfully and only needs more time/cues) 2 = Upper Extremity Support (must use UE for support or assistance to complete successfully) 1 = Needs Assistance (unable to complete w/o physical assist; DOCUMENT LEVEL: min, mod, max) 0 = Dependent (requires complete physical assist; unable to complete successfully even w/ physical assist)  Randomly Administer Once Throughout Exam  4 - Anterior Nudge (superior sternum)  4 - Posterior Nudge (between scapular spines)  4 - Lateral Nudge (to dominant side at acromion)     4 - Static sitting (30 seconds)  4 - Sitting, shake 'no' (left and right)  4 - Sitting, eyes closed (30 seconds)   0 - Sitting, lift foot (dominant side, lift foot 1 inch twice)    2 - Pick up object from behind (object at midline, hands breadth posterior)  3 - Forward reach (use dominant arm, must complete full motion) 2 - Lateral reach (use dominant arm, clear opposite ischial tuberosity) 2 - Pick up object from floor (from between feet)   2 - Posterior scooting (move backwards 2 inches)  2 - Anterior scooting (move forward 2 inches)  2 - Lateral scooting (move to dominant side 2 inches)    TOTAL = 39/56  Notes/comments: slide board tranfer to/from table   MCD > 5 points MCID for IP REHAB > 6 points  Function In Sitting Test (FIST) 08/14/23 (1/2 femur on surface; hips/knees flexed to 90deg)   - indicate bed or mat table / step stool if used  SCORING KEY: 4 = Independent (completes task independently & successfully) 3 = Verbal Cues/Increased Time (completes task independently & successfully and only needs more time/cues) 2 = Upper Extremity Support (must use UE for support or assistance to complete successfully) 1 = Needs Assistance (unable to complete w/o  physical assist; DOCUMENT LEVEL: min, mod, max) 0 = Dependent (requires complete physical assist; unable to complete successfully even w/ physical assist)  Randomly Administer Once Throughout Exam  4 - Anterior Nudge (superior sternum)  4 - Posterior Nudge (between scapular spines)  4 - Lateral Nudge (to dominant side at acromion)     4 - Static sitting (30 seconds)  4 - Sitting, shake 'no' (left and right)  4 - Sitting, eyes closed (30 seconds)   0 - Sitting, lift foot (dominant side, lift foot 1 inch twice)    4 - Pick up object from behind (object at midline, hands breadth posterior)  4 - Forward reach (use dominant arm, must complete full motion) 4 - Lateral reach (use dominant arm, clear opposite ischial tuberosity)  - Pick up object from floor (from between feet)   3 - Posterior scooting (move backwards 2 inches)  3 - Anterior scooting (move forward 2 inches)  3 - Lateral scooting (move to dominant side 2 inches)    TOTAL = 47/56  MCD > 5 points MCID for IP REHAB > 6 points                                                                                                                              TREATMENT DATE: 02/05/24  TherAct:  Pt attempted to walk on multiple occasions (8-9x), but was unable to get into standing position.  Pt functionally unable to walk due to being unable to perform the STS, but performed well with the lifting of the buttocks on all attempts.   TherEx:  Seated scapular rows with 17.5#, 2x15 Seated tricep extensions with rope with 12.5#, 2x15 Seated lat pull with 17.5# each side (35# total) Seated tricep extension with straight bar,   Seated and slightly reclined, chest press with just the bar, 2x10    PATIENT EDUCATION: Education details: Pt educated throughout session about proper posture and technique with exercises. Improved exercise technique, movement at target joints, use of target muscles after min to mod verbal, visual, tactile  cues.  Person educated: Patient Education method: Explanation, Demonstration, Tactile cues, and Verbal cues Education comprehension: verbalized understanding, returned demonstration, verbal cues required, tactile cues required, and needs further education  HOME EXERCISE PROGRAM: Stand 3x/day in stander   GOALS: Goals reviewed with patient? Yes  SHORT TERM GOALS: Target date: 08/08/2023  Patient will be independent in home exercise program to improve strength/mobility for better functional independence with ADLs.  Baseline: 4/8: compliance Goal status: MET    LONG TERM GOALS: Target date: 03/20/2024  Patient will tolerate five consecutive stands with UE support from wheelchair to standing to improve functional  mobility with additional cushion and RW.  Baseline: unable to perform 4/8:unable to perform full stand 5/20: perform next session 5/29: two full stands with CGA and cushion on her seat 7/31: able to stand 3 times from [facility]. With no addition cushion  in parallel bars. 12/27/2023= Will retest next session- concentrated on just standing on past 2 visits Goal status: Ongoing   2.  Patient will ambulate 10 ft with bRW with wheelchair follow.  Baseline:  unable to ambulate 4/8: unable to ambulate 5/20: able to ambulate length of // bars in previous session 5/27: ambulate 2.5 lengths of // bars with two seated rest breaks with min A for weight shift and wheelchair follow  7/31: able to ambulate in parallel bars 3 ft with min assist and +2 follow in WC> difficulty advancing the LLE on this day. 12/27/2023- Patient unable to take a step forward today but was abel to take a step on previous visit- limited by clonus like trembling that has progressed over past month. Goal status: Ongoing   3.  Patient will improve FIST score >6 points to demonstrate improved stability and ability to perform ADLs.  Baseline:  3/6: 39/56 4/8: 47/ 56 5/20: 48/56 7/31:46/56 Goal status: MET  4.  Patient will  perform toileting with mod I at home for improved independence.  Baseline: 3/6: requires assistance 4/8: requires dependence on machine 5/20: requires assistance with use of wipe buddy.  7/31: continues to require use of sara steady and wipe buddy for safety and hygiene. 12/27/2023- Patient reports that since she is unable to take steps that she continues to require use of sara steady and wipe buddy for safe personal hygiene. Goal status: ongoing   5. Patient will perform > 5 min of static or dynamic standing with BUE and Min/mod assist  for improved LE strength and pre-gait function to assist with toileting and overall standing ability for improve functional independence.  Baseline: 12/27/2023-Patient was able to stand for just over 3 min today. 01/10/24: Pt able to stand 1 min 38 sec Goal status: NOT PROGRESSING    ASSESSMENT:  CLINICAL IMPRESSION:  Pt responded well to the upper extremity exercises and was targeting triceps to improve overall tolerance and strength necessary for transfers into standing position.  Pt slightly discouraged by inability to perform the ambulation attempt, but ultimately performed well and will continue to try in subsequent visits.   Pt will continue to benefit from skilled therapy to address remaining deficits in order to improve overall QoL and return to PLOF.        OBJECTIVE IMPAIRMENTS: Abnormal gait, cardiopulmonary status limiting activity, decreased activity tolerance, decreased balance, decreased coordination, decreased endurance, decreased mobility, difficulty walking, decreased ROM, decreased strength, hypomobility, increased fascial restrictions, impaired perceived functional ability, impaired flexibility, impaired sensation, improper body mechanics, and postural dysfunction.   ACTIVITY LIMITATIONS: carrying, lifting, bending, sitting, standing, squatting, sleeping, stairs, transfers, bed mobility, continence, bathing, toileting, dressing, self feeding,  reach over head, hygiene/grooming, locomotion level, and caring for others  PARTICIPATION LIMITATIONS: meal prep, cleaning, laundry, interpersonal relationship, driving, shopping, community activity, and church  PERSONAL FACTORS: Age, Past/current experiences, Time since onset of injury/illness/exacerbation, Transportation, and 3+ comorbidities: ack pain, CBP, chronic L shoulder pain, galactorrhea, neuropathy, HPV, hypercholestermeia, HTN, MS, osteopenia, PONV, wrist fracture are also affecting patient's functional outcome.   REHAB POTENTIAL: Good  CLINICAL DECISION MAKING: Evolving/moderate complexity  EVALUATION COMPLEXITY: Moderate  PLAN:  PT FREQUENCY: 2x/week  PT DURATION: 12 weeks  PLANNED INTERVENTIONS: 02835-  PT Re-evaluation, 97110-Therapeutic exercises, 97530- Therapeutic activity, [id number]- Neuromuscular re-education, [phone]- Self Care, 02859- Manual therapy, [phone]- Gait training, [phone]- Orthotic Fit/training, [phone]- Canalith repositioning, [phone]- Aquatic Therapy, [phone]- Electrical stimulation (unattended), [phone]- Electrical stimulation (manual), [id number]- Ultrasound, 02987- Traction (mechanical), Patient/Family education, Balance training, Stair training, Taping, Dry Needling, Joint mobilization, Joint manipulation, Spinal manipulation, Spinal mobilization, Scar mobilization, Compression bandaging, Vestibular training, Visual/preceptual remediation/compensation, Cognitive remediation, DME instructions, Cryotherapy, Moist heat, and Biofeedback  PLAN FOR NEXT SESSION:   Strap stand frame with more dynamic activities Sit to stands; try supported walking, continue dynamic core stabilization training and seated reaching tasks to improve weight shifting.    Fonda Simpers, PT, DPT Physical Therapist - [facility] [place]  02/05/24, 11:55 AM

## 2024-02-07 ENCOUNTER — Ambulatory Visit

## 2024-02-07 ENCOUNTER — Ambulatory Visit: Payer: PPO | Attending: Neurology

## 2024-02-07 DIAGNOSIS — R269 Unspecified abnormalities of gait and mobility: Secondary | ICD-10-CM | POA: Insufficient documentation

## 2024-02-07 DIAGNOSIS — R2689 Other abnormalities of gait and mobility: Secondary | ICD-10-CM | POA: Diagnosis present

## 2024-02-07 DIAGNOSIS — R262 Difficulty in walking, not elsewhere classified: Secondary | ICD-10-CM | POA: Diagnosis present

## 2024-02-07 DIAGNOSIS — R278 Other lack of coordination: Secondary | ICD-10-CM | POA: Insufficient documentation

## 2024-02-07 DIAGNOSIS — M6281 Muscle weakness (generalized): Secondary | ICD-10-CM | POA: Insufficient documentation

## 2024-02-07 DIAGNOSIS — G35D Multiple sclerosis, unspecified: Secondary | ICD-10-CM | POA: Diagnosis present

## 2024-02-07 DIAGNOSIS — R2681 Unsteadiness on feet: Secondary | ICD-10-CM | POA: Diagnosis present

## 2024-02-07 NOTE — Therapy (Signed)
 OUTPATIENT PHYSICAL THERAPY NEURO TREATMENT  Patient Name: Lisa Williams MRN: [id number] DOB:1960-10-17, 63 y.o., female Today's Date: 02/07/2024  PCP: Lenon Na  REFERRING PROVIDER: Lenon Na   END OF SESSION:  PT End of Session - 02/07/24 1243     Visit Number 58    Number of Visits 70    Date for Recertification  03/20/24   corrected   Progress Note Due on Visit 50    PT Start Time 1018    PT Stop Time 1100    PT Time Calculation (min) 42 min    Equipment Utilized During Treatment Gait belt    Activity Tolerance Patient tolerated treatment well;No increased pain    Behavior During Therapy [facility] for tasks assessed/performed         Past Medical History:  Diagnosis Date   Abdominal pain, right upper quadrant    Back pain    Calculus of kidney 12/09/2013   Chronic back pain    unspecified   Chronic left shoulder pain 07/19/2015   Complication of anesthesia    Functional disorder of bladder    other   Galactorrhea 11/26/2014   Chronic    Hereditary and idiopathic neuropathy 08/19/2013   History of kidney stones    HPV test positive    Hypercholesteremia 08/19/2013   Hypertension    Incomplete bladder emptying    Microscopic hematuria    MS (multiple sclerosis)    Muscle spasticity 05/21/2014   Nonspecific findings on examination of urine    other   Osteopenia    PONV (postoperative nausea and vomiting)    Status post laparoscopic supracervical hysterectomy 11/26/2014   Tobacco user 11/26/2014   Wrist fracture    Past Surgical History:  Procedure Laterality Date   bilateral tubal ligation  1996   BREAST CYST EXCISION Left 2002   CYST EXCISION Left 05/10/2022   Procedure: CYST REMOVAL;  Surgeon: Rodolph Romano, MD;  Location: [facility] ORS;  Service: General;  Laterality: Left;   FRACTURE SURGERY     KNEE SURGERY     right   LAPAROSCOPIC SUPRACERVICAL HYSTERECTOMY  08/05/2013   ORIF WRIST FRACTURE Left 01/17/2017    Procedure: OPEN REDUCTION INTERNAL FIXATION (ORIF) WRIST FRACTURE;  Surgeon: Leora Lynwood SAUNDERS, MD;  Location: [facility] ORS;  Service: Orthopedics;  Laterality: Left;   RADIOLOGY WITH ANESTHESIA N/A 03/18/2020   Procedure: MRI WITH ANESTHESIA CERVICAL SPINE AND BRAIN  WITH AND WITHOUT CONTRAST;  Surgeon: Radiologist, Medication, MD;  Location: [facility] OR;  Service: Radiology;  Laterality: N/A;   TUBAL LIGATION Bilateral    VAGINAL HYSTERECTOMY  03/2006   Patient Active Problem List   Diagnosis Date Noted   Hyperglycemia 01/09/2024   Abnormal LFTs (liver function tests) 09/17/2023   Sinus tachycardia 07/07/2023   Aortic atherosclerosis 01/18/2023   Prediabetes 01/18/2023   Hypokalemia 07/29/2022   Weakness of both lower extremities 07/29/2022   Abscess of left groin 11/15/2021   Abnormal LFTs 11/15/2021   Acute respiratory disease due to COVID-19 virus 11/21/2020   Weakness    Hypoalbuminemia due to protein-calorie malnutrition    Neurogenic bowel    Neurogenic bladder    Labile blood pressure    Neuropathic pain    Multiple sclerosis, relapsing-remitting 07/09/2019   Abscess of female pelvis    SVT (supraventricular tachycardia)    Radial styloid tenosynovitis 03/12/2018   Wheelchair dependence 02/27/2018   Localized osteoporosis with current pathological fracture with routine healing 01/19/2017   Wrist fracture 01/16/2017  Sprain of ankle 03/23/2016   Closed fracture of lateral malleolus 03/16/2016   Health care maintenance 01/24/2016   Blood pressure elevated without history of HTN 10/25/2015   Essential hypertension 10/25/2015   Multiple sclerosis 10/02/2015   Chronic left shoulder pain 07/19/2015   Multiple sclerosis exacerbation 07/14/2015   MS (multiple sclerosis) 11/26/2014   Increased body mass index 11/26/2014   HPV test positive 11/26/2014   Status post laparoscopic supracervical hysterectomy 11/26/2014   Galactorrhea 11/26/2014   Back ache 05/21/2014   Adiposity  05/21/2014   Disordered sleep 05/21/2014   Muscle spasticity 05/21/2014   Spasticity 05/21/2014   Calculus of kidney 12/09/2013   Renal colic 12/09/2013   Hypercholesteremia 08/19/2013   Hereditary and idiopathic neuropathy 08/19/2013   Hypercholesterolemia without hypertriglyceridemia 08/19/2013   Bladder infection, chronic 07/25/2012   Disorder of bladder function 07/25/2012   Incomplete bladder emptying 07/25/2012   Microscopic hematuria 07/25/2012   Right upper quadrant pain 07/25/2012    ONSET DATE: 1995  REFERRING DIAG: MS  THERAPY DIAG:  Muscle weakness (generalized)  Other lack of coordination  Multiple sclerosis exacerbation  Difficulty in walking, not elsewhere classified  Unsteadiness on feet  Abnormality of gait and mobility  Other abnormalities of gait and mobility  Rationale for Evaluation and Treatment: Rehabilitation  SUBJECTIVE:                                                                                                                                                                                             SUBJECTIVE STATEMENT:  Pt reports she is feeling pretty good today, but was sore after the last PT/OT session.  Pt reports she found the core to be the most sore and challenging at the end of the session.    Pt accompanied by: self  PERTINENT HISTORY:  Patient is returning to PT s/p hospitalization.  s/p ORIF of R tibia shaft fracture 09/23/2022. Patient has weakness in BLE with RLE>LLE. She drives with hand controls. Patient has been diagnosed with MS in 1995. PMH includes: back pain, CBP, chronic L shoulder pain, galactorrhea, neuropathy, HPV, hypercholestermeia, HTN, MS, osteopenia, PONV, wrist fracture. Additional order for other closed fracture of proximal end of R tibia with routine healing. Still has to use [employer].   PAIN:  Are you having pain? Occasional pain in RLE; primarily in knee  PRECAUTIONS: Fall  RED  FLAGS: None   WEIGHT BEARING RESTRICTIONS: No  FALLS: Has patient fallen in last 6 months? No  LIVING ENVIRONMENT: Lives with: lives with their family Lives in: House/apartment Stairs: ramp Has following equipment at home: Vannie - 2 wheeled,  Wheelchair (manual), shower chair, and Grab bars  PLOF: Independent with household mobility with device  PATIENT GOALS: to get her independence back. To be able to get into car, toilet, and get dressed independently  OBJECTIVE:  Note: Objective measures were completed at Evaluation unless otherwise noted.  DIAGNOSTIC FINDINGS: MRI of the brain 03/18/2020 showed multiple T2/FLAIR hyperintense foci in the periventricular, juxtacortical and deep white matter.  There were no infratentorial lesions noted.  None of the foci enhanced.   Due to severe claustrophobia, the study was done with conscious sedation in the hospital   COGNITION: Overall cognitive status: Within functional limits for tasks assessed   SENSATION: Lack of sensation in feet, loss in bilateral lateral aspect of knee  COORDINATION: Does not have the strength for functional LE heel slide test   MUSCLE TONE: BLE mild tone    POSTURE: rounded shoulders, forward head, anterior pelvic tilt, and weight shift left   LOWER EXTREMITY MMT:    MMT Right Eval Left Eval  Hip flexion 0.9 1.5  Hip extension    Hip abduction 1.8 2.6  Hip adduction 3.1 1.9  Hip internal rotation    Hip external rotation    Knee flexion 0.9 1.5  Knee extension 0.8 1.2  Ankle dorsiflexion    Ankle plantarflexion    Ankle inversion    Ankle eversion    (Blank rows = not tested)  BED MOBILITY:  Assess in future session due to limited time  TRANSFERS: Assistive device utilized: Bariatric RW with sit to stands, slide board to table  Sit to stand: unable to reach full stand Stand to sit: unable to reach full stand  Chair to chair: slide board with CGA   GAIT: Unable to ambulate at this time.    FUNCTIONAL TESTS:  Sit to stand: tricep press with BUE; unable to bring arm to walker.    Function In Sitting Test (FIST)  (1/2 femur on surface; hips/knees flexed to 90deg)   - indicate bed or mat table / step stool if used  SCORING KEY: 4 = Independent (completes task independently & successfully) 3 = Verbal Cues/Increased Time (completes task independently & successfully and only needs more time/cues) 2 = Upper Extremity Support (must use UE for support or assistance to complete successfully) 1 = Needs Assistance (unable to complete w/o physical assist; DOCUMENT LEVEL: min, mod, max) 0 = Dependent (requires complete physical assist; unable to complete successfully even w/ physical assist)  Randomly Administer Once Throughout Exam  4 - Anterior Nudge (superior sternum)  4 - Posterior Nudge (between scapular spines)  4 - Lateral Nudge (to dominant side at acromion)     4 - Static sitting (30 seconds)  4 - Sitting, shake 'no' (left and right)  4 - Sitting, eyes closed (30 seconds)   0 - Sitting, lift foot (dominant side, lift foot 1 inch twice)    2 - Pick up object from behind (object at midline, hands breadth posterior)  3 - Forward reach (use dominant arm, must complete full motion) 2 - Lateral reach (use dominant arm, clear opposite ischial tuberosity) 2 - Pick up object from floor (from between feet)   2 - Posterior scooting (move backwards 2 inches)  2 - Anterior scooting (move forward 2 inches)  2 - Lateral scooting (move to dominant side 2 inches)    TOTAL = 39/56  Notes/comments: slide board tranfer to/from table   MCD > 5 points MCID for IP REHAB > 6 points  Function In Sitting Test (FIST) 08/14/23 (1/2 femur on surface; hips/knees flexed to 90deg)   - indicate bed or mat table / step stool if used  SCORING KEY: 4 = Independent (completes task independently & successfully) 3 = Verbal Cues/Increased Time (completes task independently & successfully and only  needs more time/cues) 2 = Upper Extremity Support (must use UE for support or assistance to complete successfully) 1 = Needs Assistance (unable to complete w/o physical assist; DOCUMENT LEVEL: min, mod, max) 0 = Dependent (requires complete physical assist; unable to complete successfully even w/ physical assist)  Randomly Administer Once Throughout Exam  4 - Anterior Nudge (superior sternum)  4 - Posterior Nudge (between scapular spines)  4 - Lateral Nudge (to dominant side at acromion)     4 - Static sitting (30 seconds)  4 - Sitting, shake 'no' (left and right)  4 - Sitting, eyes closed (30 seconds)   0 - Sitting, lift foot (dominant side, lift foot 1 inch twice)    4 - Pick up object from behind (object at midline, hands breadth posterior)  4 - Forward reach (use dominant arm, must complete full motion) 4 - Lateral reach (use dominant arm, clear opposite ischial tuberosity)  - Pick up object from floor (from between feet)   3 - Posterior scooting (move backwards 2 inches)  3 - Anterior scooting (move forward 2 inches)  3 - Lateral scooting (move to dominant side 2 inches)    TOTAL = 47/56  MCD > 5 points MCID for IP REHAB > 6 points   TREATMENT DATE: 02/07/24    TherAct:  STS with static stance utilizing the // bars for support, 5+ minutes in standing for weight shifting/balance   Gait:  Pt was able to ambulate on 3 different occasions, walking 18' 4 on the first attempt, 9' 2 on the second attempt, and 7' 11 on the third and final attempt.  35' 5 total together in the main hallway utilizing +2 modA for sitting from standing position and navigation of the wheelchair.    Pt also taken to the [employer] and was introduced to the machines in there, however discussed possibility of going to a [employer] or somewhere else that may have more accommodating machines.       PATIENT EDUCATION: Education details: Pt educated throughout session about proper posture  and technique with exercises. Improved exercise technique, movement at target joints, use of target muscles after min to mod verbal, visual, tactile cues.  Person educated: Patient Education method: Explanation, Demonstration, Tactile cues, and Verbal cues Education comprehension: verbalized understanding, returned demonstration, verbal cues required, tactile cues required, and needs further education  HOME EXERCISE PROGRAM: Stand 3x/day in stander   GOALS: Goals reviewed with patient? Yes  SHORT TERM GOALS: Target date: 08/08/2023  Patient will be independent in home exercise program to improve strength/mobility for better functional independence with ADLs.  Baseline: 4/8: compliance Goal status: MET    LONG TERM GOALS: Target date: 03/20/2024  Patient will tolerate five consecutive stands with UE support from wheelchair to standing to improve functional mobility with additional cushion and RW.  Baseline: unable to perform 4/8:unable to perform full stand 5/20: perform next session 5/29: two full stands with CGA and cushion on her seat 7/31: able to stand 3 times from [facility]. With no addition cushion  in parallel bars. 12/27/2023= Will retest next session- concentrated on just standing on past 2 visits Goal status: Ongoing   2.  Patient will  ambulate 10 ft with bRW with wheelchair follow.  Baseline:  unable to ambulate 4/8: unable to ambulate 5/20: able to ambulate length of // bars in previous session 5/27: ambulate 2.5 lengths of // bars with two seated rest breaks with min A for weight shift and wheelchair follow  7/31: able to ambulate in parallel bars 3 ft with min assist and +2 follow in WC> difficulty advancing the LLE on this day. 12/27/2023- Patient unable to take a step forward today but was abel to take a step on previous visit- limited by clonus like trembling that has progressed over past month. Goal status: Ongoing   3.  Patient will improve FIST score >6 points to demonstrate  improved stability and ability to perform ADLs.  Baseline:  3/6: 39/56 4/8: 47/ 56 5/20: 48/56 7/31:46/56 Goal status: MET  4.  Patient will perform toileting with mod I at home for improved independence.  Baseline: 3/6: requires assistance 4/8: requires dependence on machine 5/20: requires assistance with use of wipe buddy.  7/31: continues to require use of sara steady and wipe buddy for safety and hygiene. 12/27/2023- Patient reports that since she is unable to take steps that she continues to require use of sara steady and wipe buddy for safe personal hygiene. Goal status: ongoing   5. Patient will perform > 5 min of static or dynamic standing with BUE and Min/mod assist  for improved LE strength and pre-gait function to assist with toileting and overall standing ability for improve functional independence.  Baseline: 12/27/2023-Patient was able to stand for just over 3 min today. 01/10/24: Pt able to stand 1 min 38 sec Goal status: NOT PROGRESSING    ASSESSMENT:  CLINICAL IMPRESSION:  Pt performed well with the tasks and was able to ambulate further today than she has in a significant amount of time.  Pt was able to transfer better with use of // bars initially and then was able to perform 3 different standing attempts with ambulation as well.  Pt continues to improve tolerance to ambulation and is able to recognize when LE's are too fatigued to continue.   Pt will continue to benefit from skilled therapy to address remaining deficits in order to improve overall QoL and return to PLOF.        OBJECTIVE IMPAIRMENTS: Abnormal gait, cardiopulmonary status limiting activity, decreased activity tolerance, decreased balance, decreased coordination, decreased endurance, decreased mobility, difficulty walking, decreased ROM, decreased strength, hypomobility, increased fascial restrictions, impaired perceived functional ability, impaired flexibility, impaired sensation, improper body mechanics, and  postural dysfunction.   ACTIVITY LIMITATIONS: carrying, lifting, bending, sitting, standing, squatting, sleeping, stairs, transfers, bed mobility, continence, bathing, toileting, dressing, self feeding, reach over head, hygiene/grooming, locomotion level, and caring for others  PARTICIPATION LIMITATIONS: meal prep, cleaning, laundry, interpersonal relationship, driving, shopping, community activity, and church  PERSONAL FACTORS: Age, Past/current experiences, Time since onset of injury/illness/exacerbation, Transportation, and 3+ comorbidities: ack pain, CBP, chronic L shoulder pain, galactorrhea, neuropathy, HPV, hypercholestermeia, HTN, MS, osteopenia, PONV, wrist fracture are also affecting patient's functional outcome.   REHAB POTENTIAL: Good  CLINICAL DECISION MAKING: Evolving/moderate complexity  EVALUATION COMPLEXITY: Moderate  PLAN:  PT FREQUENCY: 2x/week  PT DURATION: 12 weeks  PLANNED INTERVENTIONS: [id number]- PT Re-evaluation, 97110-Therapeutic exercises, 97530- Therapeutic activity, [id number]- Neuromuscular re-education, 97535- Self Care, [id number]- Manual therapy, [id number]- Gait training, [phone]- Orthotic Fit/training, [phone]- Canalith repositioning, [id number]- Aquatic Therapy, [phone]- Electrical stimulation (unattended), [phone]- Electrical stimulation (manual), [id number]- Ultrasound, [id number]- Traction (mechanical), Patient/Family education, Balance training, Stair training,  Taping, Dry Needling, Joint mobilization, Joint manipulation, Spinal manipulation, Spinal mobilization, Scar mobilization, Compression bandaging, Vestibular training, Visual/preceptual remediation/compensation, Cognitive remediation, DME instructions, Cryotherapy, Moist heat, and Biofeedback  PLAN FOR NEXT SESSION:   Strap stand frame with more dynamic activities Sit to stands; try supported walking, continue dynamic core stabilization training and seated reaching tasks to improve weight shifting.    Fonda Simpers, PT,  DPT Physical Therapist - [facility]  02/07/24, 1:07 PM

## 2024-02-08 NOTE — Therapy (Signed)
 OUTPATIENT OCCUPATIONAL THERAPY NEURO TREATMENT NOTE  Patient Name: Lisa Williams MRN: [id number] DOB:04/28/1961, 63 y.o., female Today's Date: 02/08/2024  PCP: Dr. Layman Williams   Neurologist Dr. Suanne at [facility] REFERRING PROVIDER: Dr. Layman Williams    END OF SESSION:  OT End of Session - 02/08/24 1156     Visit Number 44    Number of Visits 67    Date for Recertification  04/29/24    Authorization Time Period Reporting period beginning 01/24/24    Progress Note Due on Visit 50    OT Start Time 1100    OT Stop Time 1140    OT Time Calculation (min) 40 min    Equipment Utilized During Treatment manual wc    Activity Tolerance Patient tolerated treatment well    Behavior During Therapy [facility] for tasks assessed/performed         Past Medical History:  Diagnosis Date   Abdominal pain, right upper quadrant    Back pain    Calculus of kidney 12/09/2013   Chronic back pain    unspecified   Chronic left shoulder pain 07/19/2015   Complication of anesthesia    Functional disorder of bladder    other   Galactorrhea 11/26/2014   Chronic    Hereditary and idiopathic neuropathy 08/19/2013   History of kidney stones    HPV test positive    Hypercholesteremia 08/19/2013   Hypertension    Incomplete bladder emptying    Microscopic hematuria    MS (multiple sclerosis)    Muscle spasticity 05/21/2014   Nonspecific findings on examination of urine    other   Osteopenia    PONV (postoperative nausea and vomiting)    Status post laparoscopic supracervical hysterectomy 11/26/2014   Tobacco user 11/26/2014   Wrist fracture    Past Surgical History:  Procedure Laterality Date   bilateral tubal ligation  1996   BREAST CYST EXCISION Left 2002   CYST EXCISION Left 05/10/2022   Procedure: CYST REMOVAL;  Surgeon: Lisa Romano, MD;  Location: [facility] ORS;  Service: General;  Laterality: Left;   FRACTURE SURGERY     KNEE SURGERY     right   LAPAROSCOPIC  SUPRACERVICAL HYSTERECTOMY  08/05/2013   ORIF WRIST FRACTURE Left 01/17/2017   Procedure: OPEN REDUCTION INTERNAL FIXATION (ORIF) WRIST FRACTURE;  Surgeon: Lisa Lynwood SAUNDERS, MD;  Location: [facility] ORS;  Service: Orthopedics;  Laterality: Left;   RADIOLOGY WITH ANESTHESIA N/A 03/18/2020   Procedure: MRI WITH ANESTHESIA CERVICAL SPINE AND BRAIN  WITH AND WITHOUT CONTRAST;  Surgeon: Radiologist, Medication, MD;  Location: [facility] OR;  Service: Radiology;  Laterality: N/A;   TUBAL LIGATION Bilateral    VAGINAL HYSTERECTOMY  03/2006   Patient Active Problem List   Diagnosis Date Noted   Hyperglycemia 01/09/2024   Abnormal LFTs (liver function tests) 09/17/2023   Sinus tachycardia 07/07/2023   Aortic atherosclerosis 01/18/2023   Prediabetes 01/18/2023   Hypokalemia 07/29/2022   Weakness of both lower extremities 07/29/2022   Abscess of left groin 11/15/2021   Abnormal LFTs 11/15/2021   Acute respiratory disease due to COVID-19 virus 11/21/2020   Weakness    Hypoalbuminemia due to protein-calorie malnutrition    Neurogenic bowel    Neurogenic bladder    Labile blood pressure    Neuropathic pain    Multiple sclerosis, relapsing-remitting 07/09/2019   Abscess of female pelvis    SVT (supraventricular tachycardia)    Radial styloid tenosynovitis 03/12/2018   Wheelchair dependence 02/27/2018   Localized  osteoporosis with current pathological fracture with routine healing 01/19/2017   Wrist fracture 01/16/2017   Sprain of ankle 03/23/2016   Closed fracture of lateral malleolus 03/16/2016   Health care maintenance 01/24/2016   Blood pressure elevated without history of HTN 10/25/2015   Essential hypertension 10/25/2015   Multiple sclerosis 10/02/2015   Chronic left shoulder pain 07/19/2015   Multiple sclerosis exacerbation 07/14/2015   MS (multiple sclerosis) 11/26/2014   Increased body mass index 11/26/2014   HPV test positive 11/26/2014   Status post laparoscopic supracervical hysterectomy  11/26/2014   Galactorrhea 11/26/2014   Back ache 05/21/2014   Adiposity 05/21/2014   Disordered sleep 05/21/2014   Muscle spasticity 05/21/2014   Spasticity 05/21/2014   Calculus of kidney 12/09/2013   Renal colic 12/09/2013   Hypercholesteremia 08/19/2013   Hereditary and idiopathic neuropathy 08/19/2013   Hypercholesterolemia without hypertriglyceridemia 08/19/2013   Bladder infection, chronic 07/25/2012   Disorder of bladder function 07/25/2012   Incomplete bladder emptying 07/25/2012   Microscopic hematuria 07/25/2012   Right upper quadrant pain 07/25/2012   ONSET DATE: 5/24 Lisa Williams and broke R leg which caused beginning of ADL decline; diagnosed with MS in 1995)  REFERRING DIAG: MS  THERAPY DIAG:  Muscle weakness (generalized)  Other lack of coordination  Multiple sclerosis exacerbation  Rationale for Evaluation and Treatment: Rehabilitation  SUBJECTIVE:  SUBJECTIVE STATEMENT: Pt reports she intentionally did not use the bathroom before leaving the house for therapy this morning so that she could attempt voiding during OT session (LTG #5) Pt accompanied by: self, pt's mother  PERTINENT HISTORY: Pt reports increased difficulty with basic self care tasks since breaking her R leg last May.  Since that time, mobility and ADLs have declined, with pt requiring assist from private caregivers and family to manage bathing, toileting, dressing, and functional transfers.  PRECAUTIONS: Fall  WEIGHT BEARING RESTRICTIONS: No  PAIN: 02/05/24: No pain  Are you having pain? No; occasional pain in R lower back, but not today  FALLS: Has patient fallen in last 6 months? No Last fall was May 17 of 2024  LIVING ENVIRONMENT: Lives with: lives with their family (including mother who has dementia and sister [name])  Lives in: 2 level home but pt resides on main level Stairs: ramp  Has following equipment at home: Wheelchair (manual), Shower bench, and hand held shower shower, 1 grab bar  in the shower, 3in1 commode, sit to stand lift (electric), FWW, hospital bed  PLOF: modified indep with ADL/IADLs prior to May of 7975   PATIENT GOALS: Increase independence with basic self care tasks  OBJECTIVE:  Note: Objective measures were completed at Evaluation unless otherwise noted.  HAND DOMINANCE: Right  ADLs:  Overall ADLs: assist provided from paid caregiver and sister Transfers/ambulation related to ADLs: sit to stand lift for all transfers, set up for sliding board in/out of bed  Eating: indep  Grooming: modified indep (wc level in mom's bathroom)  UB Dressing: set up (can't access her bedroom closet)  LB Dressing: able to sit on side of bed to don all LB clothing; assist to hike pants/underwear Toileting: assist with clothing management d/t standing within electric lift Bathing: bed bath with sister helping to wash backside Tub Shower transfers: N/A; pt has walk in shower in mom's bathroom (wc does fit in this bathroom but pt reports inability to transfer from wc and requires arm rests on a bench for a successful transfer attempt from any DME).  Tub bench is in pt's bathroom, but  lift does not fit through bathroom doorway and pt reports inability to transfer up from tub bench (currently unable to manage either transfer)   Equipment: see above  IADLs: Shopping: [employer] for shopping Light housekeeping: modified indep from wc level Meal Prep: modified indep from wc level Community mobility: relies on community transportation or family members Medication management: indep Landscape architect: indep Handwriting: NT; pt denies any FMC challenges  MOBILITY STATUS: Hx of falls  POSTURE COMMENTS:  Rounded shoulders, forward head, anterior pelvic tilt, and weight shift left   ACTIVITY TOLERANCE: Eval: Activity tolerance: Per PT; currently standing 30-60 sec within Light Gait harness/lift; currently non-ambulatory  01/24/24: Standing at sink: 1 minute; min A and 5  attempts to achieve fully erect standing position with BUEs supported on sink countertop, OT assist to block R knee to prevent buckling   UPPER EXTREMITY ROM:  BUEs WFL  UPPER EXTREMITY MMT:     MMT Right eval Left eval  Shoulder flexion 4+ 4  Shoulder abduction 4+ 4  Shoulder adduction    Shoulder extension    Shoulder internal rotation 4+ 4+  Shoulder external rotation 4 4  Middle trapezius    Lower trapezius    Elbow flexion 4+ 4+  Elbow extension 4+ 4+  Wrist flexion 4+ 4+  Wrist extension 4+ 4+  Wrist ulnar deviation    Wrist radial deviation    Wrist pronation    Wrist supination    (Blank rows = not tested)  HAND FUNCTION/COORDINATION:  R/L WNL; pt denies any coordination deficits/ able to manipulate pills/clothing fasteners/etc without difficulty  11/15/23: *Measures taken d/t PT note revealing pt with increased difficulty manipulating smaller ADL supplies during reaching activities in PT session   11/15/23: Grip strength: R: 70 lbs, L: 63 lbs Pinch strength: Lateral: R: 16 lbs, L: 15 lbs; 3 point pinch: R: 12 lbs, L: 13 lbs 9 hole peg test: Right: 25 sec, L 49 sec   12/25/23: Grip strength: Right: 70 lbs; Left: 65 lbs  9 hole peg test: Right: 24 sec; Left: 3 trials: 54 sec, 1 min, 56 sec  01/24/24: Grip grip: Right: 76 lbs; Left: 67 lbs Lateral pinch: R: 17 lbs, L: 17 lbs; 3 point pinch: R: 13 lbs, L: 14 lbs  9 Hole Peg Test: R: 26 sec, L: 50 sec  SENSATION: WFL  EDEMA: No visible edema in BUEs  MUSCLE TONE: BUEs WNL  COGNITION: Overall cognitive status: Within functional limits for tasks assessed  VISION: wears glasses all the time, no reports of diplopia  PERCEPTION: Not tested  PRAXIS: WFL  OBSERVATIONS:  Pt pleasant, cooperative, and motivated to work towards improving indep with ADLs.                                                                                                     TREATMENT DATE: 02/08/24 Self Care: -Scoot pivot transfer  wc<>toilet; min guard and mod vc for forward WS.  Mod A for lowering and hiking panties and pants for purposes for EC.  Pt able to void urine today while  seated on commode, and completed toilet hygiene with distant supv.    Therapeutic Activity: -Facilitated L hand FMC/dexterity skills working with [place] stones (scooping, storing, translatory movements within L hand palm and fingertips); manipulation skills practiced with 1-6 stones in L hand.  PATIENT EDUCATION: Education details: ADL training  Person educated: Patient Education method: Explanation, Demonstration, Tactile cues, and Verbal cues Education comprehension: verbalized understanding and needs further education  HOME EXERCISE PROGRAM: IR A/AAROM to promote shoulder flexibility for peri care/bathing  GOALS: Goals reviewed with patient? Yes  SHORT TERM GOALS: Target date: 12/25/23  Pt will perform bed bath with set up only. Baseline: Eval: Min-mod A for posterior washing; 09/28/23: Min A for posterior washing; 11/13/23: Able to manage bed bath but sister helps with thoroughness, per pt's preference; pt in agreement to trial bath sitting EOB or in wc before next OT session. Goal status: achieved/d/c  2.  Pt will utilize sliding board for wc<>drop arm commode transfer with SBA. Baseline: Eval: Currently using electric lift for transfer to [facility]; 09/28/23: Not yet completed at home but transfers to commode have been easier without sliding board d/t pt implementing a scoot pivot. Goal status: d/c (pt does not prefer sliding board for commode transfer)  3.  Pt will be indep to perform HEP for maintaining BUE strength for ADLs and functional transfers. Baseline: Eval: HEP not yet initiated; 09/28/23: Pt is working on core stability exercises from wc, cane stretches for bilat shoulder IR, and tricep extension via wc pushups; 11/13/23: indep Goal status: achieved  LONG TERM GOALS: Target date: 02/05/24  Pt will perform sink bath with set up  A. (Revised on 11/13/23 from min A to set up) Baseline: Eval: Currently bed bathing d/t inability to stand at sink without electric lift (lift does not fit into pt's bathroom); 09/28/23: still completing bed bath as pt is not yet able to stand; 11/13/23: Not yet attempted; pt encouraged to attempt before next OT session now that bed bath goal has been met; 12/20/23: Pt is now completing sponge bath with min A while seated EOB (caregiver assists with washing peri area d/t no bed rail to aid in R lateral lean); 01/24/24: Performing with set up on EOB, completing lateral weight shifts to wash posterior/peri area with wc in front of pt and bed rail for LUE support and foot of bed for RUE support  Goal status: achieved  2.  Pt will perform wc<>tub bench transfer with min A. Baseline: Eval: Unable; 09/28/23: Performed in OT clinic with min A, but not yet in the home Goal status: d/c (pt prefers sink bath)  3.  Pt will perform squat pivot transfer wc<>BSC with SBA.  Baseline: Eval: Eval: Currently using electric lift for transfer to [facility]; 09/28/23: Not yet completed in clinic with [facility], and using lift for [facility] in the home still; 11/13/23: Still using Camie lift at home and in clinic transfers are all scoot rather than squat pivots d/t LE weakness; 12/20/23: Pt reports that she plans to use her Camie lift until she can ambulate to her [facility] d/t limited space for wc in her desired location for [facility] at home. Goal status: d/c  4. Pt will perform scoot transfer wc<>toilet using grab bars with supv to allow toileting in community setting.  Baseline: Eval: Pt limits community outings d/t requiring use of lift for [facility] transfers; 09/28/23: Pt has performed 2 successful scoot pivot  Transfers to toilet in OT clinic with min A, extra time.  Further  trials with clothing management on toilet needed, but pt can lower and hike  pants while sititng edge of mat via lateral leaning and min A to maintain core stability; 11/13/23: Pt performs  with CGA and extra time; 12/19/23: Pt  performs with close supv on most attempts, occasional CGA; efficiency is improving; 01/24/24: ongoing improvements; able to complete in fewer scoots (3 scoots each way from wc<>toilet) d/t improve engagement of Les and improved anterior weight shift; occasional min guard-supv; pt completes with therapist in clinic, but not yet in other community settings with sister, and not yet voided on toilet in community    Goal status: ongoing  5.  Pt will complete seated clothing management with min A to enable voiding in handicapped stall within community. Baseline: Recert 11/13/23: Pt can transfer to toilet with increased time and effort, but has not yet attempted clothing management or voiding while seated on commode in community setting.  Pt can lower pants to knees while seated in wc, but requires mod A to hike over hips from seated position; 12/20/23: Pt fully lowers panties and shorts while seated on commode with supv, can hike clothing to her her upper thighs, transfers back to wc, and then performs a tricep extension from wc level to allow caregiver to hike clothing remainder of the way for purposes of EC (min A for clothing management component) Goal status: achieved  6.  Pt will tolerate standing x1 min with close supv and BUE support to manage clothing in prep for toileting. Baseline: Eval: Currently standing in electric lift only, or within parallel bars without lift; 09/28/23: Not yet attempted; 11/13/23: Pt is able to perform static standing with heavy BUE support on parallel bars with PT; 12/19/22: Not attempted recently d/t new intermittent clonus in BLEs; 01/24/24: Tolerated standing 1 min today at sink with min A for ascent, OT assist to block R knee from buckling Goal status: in progress  7.  Pt will increase bilat grip strength in order to securely grasp walker sufficiently for functional transfers and ambulation.  Baseline: Recert 11/13/23: TBD (PT note read  following OT session, indicating pt with difficulty securing items in hand);  11/15/23: Grip strength: R: 70 lbs, L: 63 lbs; 12/25/23: R 76 lbs, L 67 lbs; sufficient for functional transfers/amb  Goal status: achieved  8.  Pt will increase L [facility] skills in order to manipulate small ADL supplies with reduced dropping as indicated by 20 sec or more improvement in 9 hole peg test. Baseline: Recert 11/13/23: 9 hole peg test TBD (PT note read following OT session, indicating pt with difficulty manipulating and securing items in hands); 11/15/23: Right: 25 sec, L 49 sec; 12/25/23: Right: 24 sec; Left: 3 trials: 54 sec, 1 min, 56 sec; 01/24/24: R: 26 sec, L: 50 sec  Goal status: in progress  ASSESSMENT: CLINICAL IMPRESSION: Pt was able to void on community toilet today (hospital commode during OT session) for the first time since her RLE fx in May of 2024, having been limited by muscle weakness and decreased activity tolerance.  OT provided mod A for clothing management for purposes of EC d/t continued fatigue with the actual transfer process wc<>toilet.  Pt very pleased with her progress thus far, and is eager to continue to progress independence with this ADL.  Pt will continue to benefit from skilled OT to work towards above noted goals in OT poc, working to maximize indep with daily tasks while reducing burden of care on caregivers.  PERFORMANCE DEFICITS: in functional skills including ADLs, IADLs, strength, pain, flexibility, Gross motor control, mobility, balance, body mechanics, endurance, and decreased knowledge of use of DME, and psychosocial skills including coping strategies, environmental adaptation, habits, and routines and behaviors.   IMPAIRMENTS: are limiting patient from ADLs, IADLs, and social participation.   CO-MORBIDITIES: has co-morbidities such as neuropathy, back pain, obesity, MS, HTN that affects occupational performance. Patient will benefit from skilled OT to address above impairments  and improve overall function.  MODIFICATION OR ASSISTANCE TO COMPLETE EVALUATION: No modification of tasks or assist necessary to complete an evaluation.  OT OCCUPATIONAL PROFILE AND HISTORY: Detailed assessment: Review of records and additional review of physical, cognitive, psychosocial history related to current functional performance.  CLINICAL DECISION MAKING: Moderate - several treatment options, min-mod task modification necessary  REHAB POTENTIAL: Good  EVALUATION COMPLEXITY: Moderate  PLAN:  OT FREQUENCY: 2x/week  OT DURATION: 12 weeks  PLANNED INTERVENTIONS: 97168 OT Re-evaluation, 97535 self care/ADL training, 02889 therapeutic exercise, 97530 therapeutic activity, 97112 neuromuscular re-education, 97140 manual therapy, 97116 gait training, 02989 moist heat, 97010 cryotherapy, balance training, functional mobility training, psychosocial skills training, energy conservation, coping strategies training, patient/family education, and DME and/or AE instructions  RECOMMENDED OTHER SERVICES: None at this time (Pt currently receiving PT services in this clinic)  CONSULTED AND AGREED WITH PLAN OF CARE: Patient  PLAN FOR NEXT SESSION: see above  Lisa Blazing, MS, OTR/L  Lisa Williams, OT 02/08/2024, 11:57 AM

## 2024-02-12 ENCOUNTER — Ambulatory Visit

## 2024-02-12 ENCOUNTER — Ambulatory Visit: Payer: PPO

## 2024-02-12 DIAGNOSIS — R269 Unspecified abnormalities of gait and mobility: Secondary | ICD-10-CM

## 2024-02-12 DIAGNOSIS — M6281 Muscle weakness (generalized): Secondary | ICD-10-CM

## 2024-02-12 DIAGNOSIS — R2681 Unsteadiness on feet: Secondary | ICD-10-CM

## 2024-02-12 DIAGNOSIS — R278 Other lack of coordination: Secondary | ICD-10-CM

## 2024-02-12 DIAGNOSIS — G35D Multiple sclerosis, unspecified: Secondary | ICD-10-CM

## 2024-02-12 DIAGNOSIS — R2689 Other abnormalities of gait and mobility: Secondary | ICD-10-CM

## 2024-02-12 DIAGNOSIS — R262 Difficulty in walking, not elsewhere classified: Secondary | ICD-10-CM

## 2024-02-12 NOTE — Therapy (Signed)
 OUTPATIENT OCCUPATIONAL THERAPY NEURO TREATMENT NOTE  Patient Name: Lisa Williams MRN: [id number] DOB:July 30, 1960, 63 y.o., female Today's Date: 02/12/2024  PCP: Dr. Layman Piety   Neurologist Dr. Suanne at [facility] REFERRING PROVIDER: Dr. Layman Piety    END OF SESSION:  OT End of Session - 02/12/24 1134     Visit Number 45    Number of Visits 67    Date for Recertification  04/29/24    Authorization Time Period Reporting period beginning 01/24/24    Progress Note Due on Visit 50    OT Start Time 1100    OT Stop Time 1145    OT Time Calculation (min) 45 min    Equipment Utilized During Treatment manual wc    Activity Tolerance Patient tolerated treatment well    Behavior During Therapy [facility] for tasks assessed/performed         Past Medical History:  Diagnosis Date   Abdominal pain, right upper quadrant    Back pain    Calculus of kidney 12/09/2013   Chronic back pain    unspecified   Chronic left shoulder pain 07/19/2015   Complication of anesthesia    Functional disorder of bladder    other   Galactorrhea 11/26/2014   Chronic    Hereditary and idiopathic neuropathy 08/19/2013   History of kidney stones    HPV test positive    Hypercholesteremia 08/19/2013   Hypertension    Incomplete bladder emptying    Microscopic hematuria    MS (multiple sclerosis)    Muscle spasticity 05/21/2014   Nonspecific findings on examination of urine    other   Osteopenia    PONV (postoperative nausea and vomiting)    Status post laparoscopic supracervical hysterectomy 11/26/2014   Tobacco user 11/26/2014   Wrist fracture    Past Surgical History:  Procedure Laterality Date   bilateral tubal ligation  1996   BREAST CYST EXCISION Left 2002   CYST EXCISION Left 05/10/2022   Procedure: CYST REMOVAL;  Surgeon: Rodolph Romano, MD;  Location: [facility] ORS;  Service: General;  Laterality: Left;   FRACTURE SURGERY     KNEE SURGERY     right   LAPAROSCOPIC  SUPRACERVICAL HYSTERECTOMY  08/05/2013   ORIF WRIST FRACTURE Left 01/17/2017   Procedure: OPEN REDUCTION INTERNAL FIXATION (ORIF) WRIST FRACTURE;  Surgeon: Leora Lynwood SAUNDERS, MD;  Location: [facility] ORS;  Service: Orthopedics;  Laterality: Left;   RADIOLOGY WITH ANESTHESIA N/A 03/18/2020   Procedure: MRI WITH ANESTHESIA CERVICAL SPINE AND BRAIN  WITH AND WITHOUT CONTRAST;  Surgeon: Radiologist, Medication, MD;  Location: [facility] OR;  Service: Radiology;  Laterality: N/A;   TUBAL LIGATION Bilateral    VAGINAL HYSTERECTOMY  03/2006   Patient Active Problem List   Diagnosis Date Noted   Hyperglycemia 01/09/2024   Abnormal LFTs (liver function tests) 09/17/2023   Sinus tachycardia 07/07/2023   Aortic atherosclerosis 01/18/2023   Prediabetes 01/18/2023   Hypokalemia 07/29/2022   Weakness of both lower extremities 07/29/2022   Abscess of left groin 11/15/2021   Abnormal LFTs 11/15/2021   Acute respiratory disease due to COVID-19 virus 11/21/2020   Weakness    Hypoalbuminemia due to protein-calorie malnutrition    Neurogenic bowel    Neurogenic bladder    Labile blood pressure    Neuropathic pain    Multiple sclerosis, relapsing-remitting 07/09/2019   Abscess of female pelvis    SVT (supraventricular tachycardia)    Radial styloid tenosynovitis 03/12/2018   Wheelchair dependence 02/27/2018   Localized  osteoporosis with current pathological fracture with routine healing 01/19/2017   Wrist fracture 01/16/2017   Sprain of ankle 03/23/2016   Closed fracture of lateral malleolus 03/16/2016   Health care maintenance 01/24/2016   Blood pressure elevated without history of HTN 10/25/2015   Essential hypertension 10/25/2015   Multiple sclerosis 10/02/2015   Chronic left shoulder pain 07/19/2015   Multiple sclerosis exacerbation 07/14/2015   MS (multiple sclerosis) 11/26/2014   Increased body mass index 11/26/2014   HPV test positive 11/26/2014   Status post laparoscopic supracervical hysterectomy  11/26/2014   Galactorrhea 11/26/2014   Back ache 05/21/2014   Adiposity 05/21/2014   Disordered sleep 05/21/2014   Muscle spasticity 05/21/2014   Spasticity 05/21/2014   Calculus of kidney 12/09/2013   Renal colic 12/09/2013   Hypercholesteremia 08/19/2013   Hereditary and idiopathic neuropathy 08/19/2013   Hypercholesterolemia without hypertriglyceridemia 08/19/2013   Bladder infection, chronic 07/25/2012   Disorder of bladder function 07/25/2012   Incomplete bladder emptying 07/25/2012   Microscopic hematuria 07/25/2012   Right upper quadrant pain 07/25/2012   ONSET DATE: 5/24 Amon and broke R leg which caused beginning of ADL decline; diagnosed with MS in 1995)  REFERRING DIAG: MS  THERAPY DIAG:  Muscle weakness (generalized)  Other lack of coordination  Multiple sclerosis exacerbation  Rationale for Evaluation and Treatment: Rehabilitation  SUBJECTIVE:  SUBJECTIVE STATEMENT: Pt reports knee pain today and a little numbness in R foot which comes and goes on occasion.  Pt accompanied by: self, pt's mother  PERTINENT HISTORY: Pt reports increased difficulty with basic self care tasks since breaking her R leg last May.  Since that time, mobility and ADLs have declined, with pt requiring assist from private caregivers and family to manage bathing, toileting, dressing, and functional transfers.  PRECAUTIONS: Fall  WEIGHT BEARING RESTRICTIONS: No  PAIN: 02/12/24: 7/10 R knee   Are you having pain? No; occasional pain in R lower back, but not today  FALLS: Has patient fallen in last 6 months? No Last fall was May 17 of 2024  LIVING ENVIRONMENT: Lives with: lives with their family (including mother who has dementia and sister [name])  Lives in: 2 level home but pt resides on main level Stairs: ramp  Has following equipment at home: Wheelchair (manual), Shower bench, and hand held shower shower, 1 grab bar in the shower, 3in1 commode, sit to stand lift (electric), FWW,  hospital bed  PLOF: modified indep with ADL/IADLs prior to May of 7975   PATIENT GOALS: Increase independence with basic self care tasks  OBJECTIVE:  Note: Objective measures were completed at Evaluation unless otherwise noted.  HAND DOMINANCE: Right  ADLs:  Overall ADLs: assist provided from paid caregiver and sister Transfers/ambulation related to ADLs: sit to stand lift for all transfers, set up for sliding board in/out of bed  Eating: indep  Grooming: modified indep (wc level in mom's bathroom)  UB Dressing: set up (can't access her bedroom closet)  LB Dressing: able to sit on side of bed to don all LB clothing; assist to hike pants/underwear Toileting: assist with clothing management d/t standing within electric lift Bathing: bed bath with sister helping to wash backside Tub Shower transfers: N/A; pt has walk in shower in mom's bathroom (wc does fit in this bathroom but pt reports inability to transfer from wc and requires arm rests on a bench for a successful transfer attempt from any DME).  Tub bench is in pt's bathroom, but lift does not fit through bathroom doorway  and pt reports inability to transfer up from tub bench (currently unable to manage either transfer)   Equipment: see above  IADLs: Shopping: [employer] for shopping Light housekeeping: modified indep from wc level Meal Prep: modified indep from wc level Community mobility: relies on community transportation or family members Medication management: indep Landscape architect: indep Handwriting: NT; pt denies any [facility]MC challenges  MOBILITY STATUS: Hx of falls  POSTURE COMMENTS:  Rounded shoulders, forward head, anterior pelvic tilt, and weight shift left   ACTIVITY TOLERANCE: Eval: Activity tolerance: Per PT; currently standing 30-60 sec within Light Gait harness/lift; currently non-ambulatory  01/24/24: Standing at sink: 1 minute; min A and 5 attempts to achieve fully erect standing position with BUEs  supported on sink countertop, OT assist to block R knee to prevent buckling   UPPER EXTREMITY ROM:  BUEs WFL  UPPER EXTREMITY MMT:     MMT Right eval Left eval  Shoulder flexion 4+ 4  Shoulder abduction 4+ 4  Shoulder adduction    Shoulder extension    Shoulder internal rotation 4+ 4+  Shoulder external rotation 4 4  Middle trapezius    Lower trapezius    Elbow flexion 4+ 4+  Elbow extension 4+ 4+  Wrist flexion 4+ 4+  Wrist extension 4+ 4+  Wrist ulnar deviation    Wrist radial deviation    Wrist pronation    Wrist supination    (Blank rows = not tested)  HAND FUNCTION/COORDINATION:  R/L WNL; pt denies any coordination deficits/ able to manipulate pills/clothing fasteners/etc without difficulty  11/15/23: *Measures taken d/t PT note revealing pt with increased difficulty manipulating smaller ADL supplies during reaching activities in PT session   11/15/23: Grip strength: R: 70 lbs, L: 63 lbs Pinch strength: Lateral: R: 16 lbs, L: 15 lbs; 3 point pinch: R: 12 lbs, L: 13 lbs 9 hole peg test: Right: 25 sec, L 49 sec   12/25/23: Grip strength: Right: 70 lbs; Left: 65 lbs  9 hole peg test: Right: 24 sec; Left: 3 trials: 54 sec, 1 min, 56 sec  01/24/24: Grip grip: Right: 76 lbs; Left: 67 lbs Lateral pinch: R: 17 lbs, L: 17 lbs; 3 point pinch: R: 13 lbs, L: 14 lbs  9 Hole Peg Test: R: 26 sec, L: 50 sec  SENSATION: WFL  EDEMA: No visible edema in BUEs  MUSCLE TONE: BUEs WNL  COGNITION: Overall cognitive status: Within functional limits for tasks assessed  VISION: wears glasses all the time, no reports of diplopia  PERCEPTION: Not tested  PRAXIS: WFL  OBSERVATIONS:  Pt pleasant, cooperative, and motivated to work towards improving indep with ADLs.                                                                                                     TREATMENT DATE: 02/12/24 Therapeutic Exercise: -Bilat triceps strengthening:  -Wc pushups with feet forward/knees  extended to reduce engagement of BLEs: 3 sets 5 reps; min A to stabilize feet  -Blue theraband, 3 sets 10 reps R/L for elbow ext above the head; min vc for  positioning/technique  Therapeutic Activity: -Facilitated core strengthening with participation in sorting cards from wc level.  Pt practiced contralateral reaching outside BOS to sequence cards from a deck across table top for 52 cards, then ipsilateral reaching toward floor level for 52 cards, alternating R/L with black and red cards; close SBA for reach towards floor   PATIENT EDUCATION: Education details: BUE and core strengthening exercises  Person educated: Patient Education method: Explanation, Demonstration, and Verbal cues Education comprehension: verbalized understanding and needs further education  HOME EXERCISE PROGRAM: IR A/AAROM to promote shoulder flexibility for peri care/bathing  GOALS: Goals reviewed with patient? Yes  SHORT TERM GOALS: Target date: 12/25/23  Pt will perform bed bath with set up only. Baseline: Eval: Min-mod A for posterior washing; 09/28/23: Min A for posterior washing; 11/13/23: Able to manage bed bath but sister helps with thoroughness, per pt's preference; pt in agreement to trial bath sitting EOB or in wc before next OT session. Goal status: achieved/d/c  2.  Pt will utilize sliding board for wc<>drop arm commode transfer with SBA. Baseline: Eval: Currently using electric lift for transfer to [facility]; 09/28/23: Not yet completed at home but transfers to commode have been easier without sliding board d/t pt implementing a scoot pivot. Goal status: d/c (pt does not prefer sliding board for commode transfer)  3.  Pt will be indep to perform HEP for maintaining BUE strength for ADLs and functional transfers. Baseline: Eval: HEP not yet initiated; 09/28/23: Pt is working on core stability exercises from wc, cane stretches for bilat shoulder IR, and tricep extension via wc pushups; 11/13/23: indep Goal status:  achieved  LONG TERM GOALS: Target date: 02/05/24  Pt will perform sink bath with set up A. (Revised on 11/13/23 from min A to set up) Baseline: Eval: Currently bed bathing d/t inability to stand at sink without electric lift (lift does not fit into pt's bathroom); 09/28/23: still completing bed bath as pt is not yet able to stand; 11/13/23: Not yet attempted; pt encouraged to attempt before next OT session now that bed bath goal has been met; 12/20/23: Pt is now completing sponge bath with min A while seated EOB (caregiver assists with washing peri area d/t no bed rail to aid in R lateral lean); 01/24/24: Performing with set up on EOB, completing lateral weight shifts to wash posterior/peri area with wc in front of pt and bed rail for LUE support and foot of bed for RUE support  Goal status: achieved  2.  Pt will perform wc<>tub bench transfer with min A. Baseline: Eval: Unable; 09/28/23: Performed in OT clinic with min A, but not yet in the home Goal status: d/c (pt prefers sink bath)  3.  Pt will perform squat pivot transfer wc<>BSC with SBA.  Baseline: Eval: Eval: Currently using electric lift for transfer to [facility]; 09/28/23: Not yet completed in clinic with [facility], and using lift for [facility] in the home still; 11/13/23: Still using Camie lift at home and in clinic transfers are all scoot rather than squat pivots d/t LE weakness; 12/20/23: Pt reports that she plans to use her Camie lift until she can ambulate to her [facility] d/t limited space for wc in her desired location for [facility] at home. Goal status: d/c  4. Pt will perform scoot transfer wc<>toilet using grab bars with supv to allow toileting in community setting.  Baseline: Eval: Pt limits community outings d/t requiring use of lift for [facility] transfers; 09/28/23: Pt has performed 2 successful scoot  pivot  Transfers to toilet in OT clinic with min A, extra time.  Further trials with clothing management on toilet needed, but pt can lower and hike  pants while sititng  edge of mat via lateral leaning and min A to maintain core stability; 11/13/23: Pt performs with CGA and extra time; 12/19/23: Pt  performs with close supv on most attempts, occasional CGA; efficiency is improving; 01/24/24: ongoing improvements; able to complete in fewer scoots (3 scoots each way from wc<>toilet) d/t improve engagement of Les and improved anterior weight shift; occasional min guard-supv; pt completes with therapist in clinic, but not yet in other community settings with sister, and not yet voided on toilet in community    Goal status: ongoing  5.  Pt will complete seated clothing management with min A to enable voiding in handicapped stall within community. Baseline: Recert 11/13/23: Pt can transfer to toilet with increased time and effort, but has not yet attempted clothing management or voiding while seated on commode in community setting.  Pt can lower pants to knees while seated in wc, but requires mod A to hike over hips from seated position; 12/20/23: Pt fully lowers panties and shorts while seated on commode with supv, can hike clothing to her her upper thighs, transfers back to wc, and then performs a tricep extension from wc level to allow caregiver to hike clothing remainder of the way for purposes of EC (min A for clothing management component) Goal status: achieved  6.  Pt will tolerate standing x1 min with close supv and BUE support to manage clothing in prep for toileting. Baseline: Eval: Currently standing in electric lift only, or within parallel bars without lift; 09/28/23: Not yet attempted; 11/13/23: Pt is able to perform static standing with heavy BUE support on parallel bars with PT; 12/19/22: Not attempted recently d/t new intermittent clonus in BLEs; 01/24/24: Tolerated standing 1 min today at sink with min A for ascent, OT assist to block R knee from buckling Goal status: in progress  7.  Pt will increase bilat grip strength in order to securely grasp walker sufficiently for  functional transfers and ambulation.  Baseline: Recert 11/13/23: TBD (PT note read following OT session, indicating pt with difficulty securing items in hand);  11/15/23: Grip strength: R: 70 lbs, L: 63 lbs; 12/25/23: R 76 lbs, L 67 lbs; sufficient for functional transfers/amb  Goal status: achieved  8.  Pt will increase [facility] skills in order to manipulate small ADL supplies with reduced dropping as indicated by 20 sec or more improvement in 9 hole peg test. Baseline: Recert 11/13/23: 9 hole peg test TBD (PT note read following OT session, indicating pt with difficulty manipulating and securing items in hands); 11/15/23: Right: 25 sec, L 49 sec; 12/25/23: Right: 24 sec; Left: 3 trials: 54 sec, 1 min, 56 sec; 01/24/24: R: 26 sec, L: 50 sec  Goal status: in progress  ASSESSMENT: CLINICAL IMPRESSION: Avoided standing trials and transfers this date d/t pt reporting some numbness in the R foot and 7/10 pain in the R knee.  Good tolerance to triceps and core strengthening this date.  Pt verbalized feeling that it's getting easier to get up and down from her chair.  Pt will continue to benefit from skilled OT to work towards above noted goals in OT poc, working to maximize indep with daily tasks while reducing burden of care on caregivers.   PERFORMANCE DEFICITS: in functional skills including ADLs, IADLs, strength, pain, flexibility, Gross motor  control, mobility, balance, body mechanics, endurance, and decreased knowledge of use of DME, and psychosocial skills including coping strategies, environmental adaptation, habits, and routines and behaviors.   IMPAIRMENTS: are limiting patient from ADLs, IADLs, and social participation.   CO-MORBIDITIES: has co-morbidities such as neuropathy, back pain, obesity, MS, HTN that affects occupational performance. Patient will benefit from skilled OT to address above impairments and improve overall function.  MODIFICATION OR ASSISTANCE TO COMPLETE EVALUATION: No modification  of tasks or assist necessary to complete an evaluation.  OT OCCUPATIONAL PROFILE AND HISTORY: Detailed assessment: Review of records and additional review of physical, cognitive, psychosocial history related to current functional performance.  CLINICAL DECISION MAKING: Moderate - several treatment options, min-mod task modification necessary  REHAB POTENTIAL: Good  EVALUATION COMPLEXITY: Moderate  PLAN:  OT FREQUENCY: 2x/week  OT DURATION: 12 weeks  PLANNED INTERVENTIONS: 97168 OT Re-evaluation, 97535 self care/ADL training, 02889 therapeutic exercise, 97530 therapeutic activity, 97112 neuromuscular re-education, 97140 manual therapy, 97116 gait training, 02989 moist heat, 97010 cryotherapy, balance training, functional mobility training, psychosocial skills training, energy conservation, coping strategies training, patient/family education, and DME and/or AE instructions  RECOMMENDED OTHER SERVICES: None at this time (Pt currently receiving PT services in this clinic)  CONSULTED AND AGREED WITH PLAN OF CARE: Patient  PLAN FOR NEXT SESSION: see above  Inocente Blazing, MS, OTR/L  Inocente MARLA Blazing, OT 02/12/2024, 11:36 AM

## 2024-02-12 NOTE — Therapy (Signed)
 OUTPATIENT PHYSICAL THERAPY NEURO TREATMENT  Patient Name: Lisa Williams Decock MRN: [id number] DOB:Jul 28, 1960, 63 y.o., female Today's Date: 02/12/2024  PCP: Lenon Na  REFERRING PROVIDER: Lenon Na   END OF SESSION:  PT End of Session - 02/12/24 1154     Visit Number 59    Number of Visits 70    Date for Recertification  03/20/24   corrected   Progress Note Due on Visit 50    PT Start Time 1015    PT Stop Time 1058    PT Time Calculation (min) 43 min    Equipment Utilized During Treatment Gait belt    Activity Tolerance Patient tolerated treatment well;No increased pain    Behavior During Therapy [facility] for tasks assessed/performed          Past Medical History:  Diagnosis Date   Abdominal pain, right upper quadrant    Back pain    Calculus of kidney 12/09/2013   Chronic back pain    unspecified   Chronic left shoulder pain 07/19/2015   Complication of anesthesia    Functional disorder of bladder    other   Galactorrhea 11/26/2014   Chronic    Hereditary and idiopathic neuropathy 08/19/2013   History of kidney stones    HPV test positive    Hypercholesteremia 08/19/2013   Hypertension    Incomplete bladder emptying    Microscopic hematuria    MS (multiple sclerosis)    Muscle spasticity 05/21/2014   Nonspecific findings on examination of urine    other   Osteopenia    PONV (postoperative nausea and vomiting)    Status post laparoscopic supracervical hysterectomy 11/26/2014   Tobacco user 11/26/2014   Wrist fracture    Past Surgical History:  Procedure Laterality Date   bilateral tubal ligation  1996   BREAST CYST EXCISION Left 2002   CYST EXCISION Left 05/10/2022   Procedure: CYST REMOVAL;  Surgeon: Rodolph Romano, MD;  Location: [facility] ORS;  Service: General;  Laterality: Left;   FRACTURE SURGERY     KNEE SURGERY     right   LAPAROSCOPIC SUPRACERVICAL HYSTERECTOMY  08/05/2013   ORIF WRIST FRACTURE Left 01/17/2017    Procedure: OPEN REDUCTION INTERNAL FIXATION (ORIF) WRIST FRACTURE;  Surgeon: Leora Lynwood SAUNDERS, MD;  Location: [facility] OR[facility];  Service: Orthopedics;  Laterality: Left;   RADIOLOGY WITH ANESTHESIA N/A 03/18/2020   Procedure: MRI WITH ANESTHESIA CERVICAL SPINE AND BRAIN  WITH AND WITHOUT CONTRAST;  Surgeon: Radiologist, Medication, MD;  Location: [facility] OR;  Service: Radiology;  Laterality: N/A;   TUBAL LIGATION Bilateral    VAGINAL HYSTERECTOMY  03/2006   Patient Active Problem List   Diagnosis Date Noted   Hyperglycemia 01/09/2024   Abnormal LFTs (liver function tests) 09/17/2023   Sinus tachycardia 07/07/2023   Aortic atherosclerosis 01/18/2023   Prediabetes 01/18/2023   Hypokalemia 07/29/2022   Weakness of both lower extremities 07/29/2022   Abscess of left groin 11/15/2021   Abnormal LFTs 11/15/2021   Acute respiratory disease due to COVID-19 virus 11/21/2020   Weakness    Hypoalbuminemia due to protein-calorie malnutrition    Neurogenic bowel    Neurogenic bladder    Labile blood pressure    Neuropathic pain    Multiple sclerosis, relapsing-remitting 07/09/2019   Abscess of female pelvis    SVT (supraventricular tachycardia)    Radial styloid tenosynovitis 03/12/2018   Wheelchair dependence 02/27/2018   Localized osteoporosis with current pathological fracture with routine healing 01/19/2017   Wrist fracture 01/16/2017  Sprain of ankle 03/23/2016   Closed fracture of lateral malleolus 03/16/2016   Health care maintenance 01/24/2016   Blood pressure elevated without history of HTN 10/25/2015   Essential hypertension 10/25/2015   Multiple sclerosis 10/02/2015   Chronic left shoulder pain 07/19/2015   Multiple sclerosis exacerbation 07/14/2015   MS (multiple sclerosis) 11/26/2014   Increased body mass index 11/26/2014   HPV test positive 11/26/2014   Status post laparoscopic supracervical hysterectomy 11/26/2014   Galactorrhea 11/26/2014   Back ache 05/21/2014   Adiposity  05/21/2014   Disordered sleep 05/21/2014   Muscle spasticity 05/21/2014   Spasticity 05/21/2014   Calculus of kidney 12/09/2013   Renal colic 12/09/2013   Hypercholesteremia 08/19/2013   Hereditary and idiopathic neuropathy 08/19/2013   Hypercholesterolemia without hypertriglyceridemia 08/19/2013   Bladder infection, chronic 07/25/2012   Disorder of bladder function 07/25/2012   Incomplete bladder emptying 07/25/2012   Microscopic hematuria 07/25/2012   Right upper quadrant pain 07/25/2012    ONSET DATE: 1995  REFERRING DIAG: MS  THERAPY DIAG:  Muscle weakness (generalized)  Other lack of coordination  Multiple sclerosis exacerbation  Difficulty in walking, not elsewhere classified  Unsteadiness on feet  Abnormality of gait and mobility  Other abnormalities of gait and mobility  Rationale for Evaluation and Treatment: Rehabilitation  SUBJECTIVE:                                                                                                                                                                                             SUBJECTIVE STATEMENT:  Pt reports feeling good today and ready to begin therapy.  Pt denies any other concerns at this point in time.  Pt accompanied by: self  PERTINENT HISTORY:  Patient is returning to PT s/p hospitalization.  s/p ORIF of R tibia shaft fracture 09/23/2022. Patient has weakness in BLE with RLE>LLE. She drives with hand controls. Patient has been diagnosed with MS in 1995. PMH includes: back pain, CBP, chronic L shoulder pain, galactorrhea, neuropathy, HPV, hypercholestermeia, HTN, MS, osteopenia, PONV, wrist fracture. Additional order for other closed fracture of proximal end of R tibia with routine healing. Still has to use [employer].   PAIN:  Are you having pain? Occasional pain in RLE; primarily in knee  PRECAUTIONS: Fall  RED FLAGS: None   WEIGHT BEARING RESTRICTIONS: No  FALLS: Has patient fallen in last 6  months? No  LIVING ENVIRONMENT: Lives with: lives with their family Lives in: House/apartment Stairs: ramp Has following equipment at home: Vannie - 2 wheeled, Wheelchair (manual), shower chair, and Grab bars  PLOF: Independent with household mobility with device  PATIENT  GOALS: to get her independence back. To be able to get into car, toilet, and get dressed independently  OBJECTIVE:  Note: Objective measures were completed at Evaluation unless otherwise noted.  DIAGNOSTIC FINDINGS: MRI of the brain 03/18/2020 showed multiple T2/FLAIR hyperintense foci in the periventricular, juxtacortical and deep white matter.  There were no infratentorial lesions noted.  None of the foci enhanced.   Due to severe claustrophobia, the study was done with conscious sedation in the hospital   COGNITION: Overall cognitive status: Within functional limits for tasks assessed   SENSATION: Lack of sensation in feet, loss in bilateral lateral aspect of knee  COORDINATION: Does not have the strength for functional LE heel slide test   MUSCLE TONE: BLE mild tone    POSTURE: rounded shoulders, forward head, anterior pelvic tilt, and weight shift left   LOWER EXTREMITY MMT:    MMT Right Eval Left Eval  Hip flexion 0.9 1.5  Hip extension    Hip abduction 1.8 2.6  Hip adduction 3.1 1.9  Hip internal rotation    Hip external rotation    Knee flexion 0.9 1.5  Knee extension 0.8 1.2  Ankle dorsiflexion    Ankle plantarflexion    Ankle inversion    Ankle eversion    (Blank rows = not tested)  BED MOBILITY:  Assess in future session due to limited time  TRANSFERS: Assistive device utilized: Bariatric RW with sit to stands, slide board to table  Sit to stand: unable to reach full stand Stand to sit: unable to reach full stand  Chair to chair: slide board with CGA   GAIT: Unable to ambulate at this time.   FUNCTIONAL TESTS:  Sit to stand: tricep press with BUE; unable to bring arm to  walker.    Function In Sitting Test (FIST)  (1/2 femur on surface; hips/knees flexed to 90deg)   - indicate bed or mat table / step stool if used  SCORING KEY: 4 = Independent (completes task independently & successfully) 3 = Verbal Cues/Increased Time (completes task independently & successfully and only needs more time/cues) 2 = Upper Extremity Support (must use UE for support or assistance to complete successfully) 1 = Needs Assistance (unable to complete w/o physical assist; DOCUMENT LEVEL: min, mod, max) 0 = Dependent (requires complete physical assist; unable to complete successfully even w/ physical assist)  Randomly Administer Once Throughout Exam  4 - Anterior Nudge (superior sternum)  4 - Posterior Nudge (between scapular spines)  4 - Lateral Nudge (to dominant side at acromion)     4 - Static sitting (30 seconds)  4 - Sitting, shake 'no' (left and right)  4 - Sitting, eyes closed (30 seconds)   0 - Sitting, lift foot (dominant side, lift foot 1 inch twice)    2 - Pick up object from behind (object at midline, hands breadth posterior)  3 - Forward reach (use dominant arm, must complete full motion) 2 - Lateral reach (use dominant arm, clear opposite ischial tuberosity) 2 - Pick up object from floor (from between feet)   2 - Posterior scooting (move backwards 2 inches)  2 - Anterior scooting (move forward 2 inches)  2 - Lateral scooting (move to dominant side 2 inches)    TOTAL = 39/56  Notes/comments: slide board tranfer to/from table   MCD > 5 points MCID for IP REHAB > 6 points  Function In Sitting Test (FIST) 08/14/23 (1/2 femur on surface; hips/knees flexed to 90deg)   -  indicate bed or mat table / step stool if used  SCORING KEY: 4 = Independent (completes task independently & successfully) 3 = Verbal Cues/Increased Time (completes task independently & successfully and only needs more time/cues) 2 = Upper Extremity Support (must use UE for support or  assistance to complete successfully) 1 = Needs Assistance (unable to complete w/o physical assist; DOCUMENT LEVEL: min, mod, max) 0 = Dependent (requires complete physical assist; unable to complete successfully even w/ physical assist)  Randomly Administer Once Throughout Exam  4 - Anterior Nudge (superior sternum)  4 - Posterior Nudge (between scapular spines)  4 - Lateral Nudge (to dominant side at acromion)     4 - Static sitting (30 seconds)  4 - Sitting, shake 'no' (left and right)  4 - Sitting, eyes closed (30 seconds)   0 - Sitting, lift foot (dominant side, lift foot 1 inch twice)    4 - Pick up object from behind (object at midline, hands breadth posterior)  4 - Forward reach (use dominant arm, must complete full motion) 4 - Lateral reach (use dominant arm, clear opposite ischial tuberosity)  - Pick up object from floor (from between feet)   3 - Posterior scooting (move backwards 2 inches)  3 - Anterior scooting (move forward 2 inches)  3 - Lateral scooting (move to dominant side 2 inches)    TOTAL = 47/56  MCD > 5 points MCID for IP REHAB > 6 points   TREATMENT DATE: 02/12/24   TherAct:  STS with static stance utilizing the // bars for support, 3 min 18 sec standing for weight shifting/balance.    STS x 5 total attempts with 2 full upright stands, and 3 partial uprights due to the R knee being too weak to perform  Standing frame (using the Strap stand): Patient able to push up from chair to minimally raise her bottom to allow author to position gluteal padding underneath her.    Dependent with foot straps and locking of frame.  Static stance without UE support for core and glute activation, 30 sec bouts x4  Dynamic ball toss/catch with therapist, 30 sec bouts with rest breaks in between, x3 total attempts   Gait:  Pt was able to ambulate approximately 6' today, but felt more unsteady with the R LE today, and was unable to ambulate further today.         PATIENT EDUCATION: Education details: Pt educated throughout session about proper posture and technique with exercises. Improved exercise technique, movement at target joints, use of target muscles after min to mod verbal, visual, tactile cues.  Person educated: Patient Education method: Explanation, Demonstration, Tactile cues, and Verbal cues Education comprehension: verbalized understanding, returned demonstration, verbal cues required, tactile cues required, and needs further education  HOME EXERCISE PROGRAM: Stand 3x/day in stander   GOALS: Goals reviewed with patient? Yes  SHORT TERM GOALS: Target date: 08/08/2023  Patient will be independent in home exercise program to improve strength/mobility for better functional independence with ADLs.  Baseline: 4/8: compliance Goal status: MET    LONG TERM GOALS: Target date: 03/20/2024  Patient will tolerate five consecutive stands with UE support from wheelchair to standing to improve functional mobility with additional cushion and RW.  Baseline: unable to perform 4/8:unable to perform full stand 5/20: perform next session 5/29: two full stands with CGA and cushion on her seat 7/31: able to stand 3 times from [facility]. With no addition cushion  in parallel bars. 12/27/2023= Will retest next  session- concentrated on just standing on past 2 visits Goal status: Ongoing   2.  Patient will ambulate 10 ft with bRW with wheelchair follow.  Baseline:  unable to ambulate 4/8: unable to ambulate 5/20: able to ambulate length of // bars in previous session 5/27: ambulate 2.5 lengths of // bars with two seated rest breaks with min A for weight shift and wheelchair follow  7/31: able to ambulate in parallel bars 3 ft with min assist and +2 follow in WC> difficulty advancing the LLE on this day. 12/27/2023- Patient unable to take a step forward today but was abel to take a step on previous visit- limited by clonus like trembling that has progressed  over past month. Goal status: Ongoing   3.  Patient will improve FIST score >6 points to demonstrate improved stability and ability to perform ADLs.  Baseline:  3/6: 39/56 4/8: 47/ 56 5/20: 48/56 7/31:46/56 Goal status: MET  4.  Patient will perform toileting with mod I at home for improved independence.  Baseline: 3/6: requires assistance 4/8: requires dependence on machine 5/20: requires assistance with use of wipe buddy.  7/31: continues to require use of sara steady and wipe buddy for safety and hygiene. 12/27/2023- Patient reports that since she is unable to take steps that she continues to require use of sara steady and wipe buddy for safe personal hygiene. Goal status: ongoing   5. Patient will perform > 5 min of static or dynamic standing with BUE and Min/mod assist  for improved LE strength and pre-gait function to assist with toileting and overall standing ability for improve functional independence.  Baseline: 12/27/2023-Patient was able to stand for just over 3 min today. 01/10/24: Pt able to stand 1 min 38 sec Goal status: NOT PROGRESSING    ASSESSMENT:  CLINICAL IMPRESSION:  Pt responded well to the treatment approach and was able to perform several STS's during the session.  Pt was limited in her ability to ambulate, however still performed well regarding the tasks given.  Pt ultimately is making significant improvements, with pattern of being better on Thursdays in regards to ambulation attempts.   Pt will continue to benefit from skilled therapy to address remaining deficits in order to improve overall QoL and return to PLOF.         OBJECTIVE IMPAIRMENTS: Abnormal gait, cardiopulmonary status limiting activity, decreased activity tolerance, decreased balance, decreased coordination, decreased endurance, decreased mobility, difficulty walking, decreased ROM, decreased strength, hypomobility, increased fascial restrictions, impaired perceived functional ability, impaired  flexibility, impaired sensation, improper body mechanics, and postural dysfunction.   ACTIVITY LIMITATIONS: carrying, lifting, bending, sitting, standing, squatting, sleeping, stairs, transfers, bed mobility, continence, bathing, toileting, dressing, self feeding, reach over head, hygiene/grooming, locomotion level, and caring for others  PARTICIPATION LIMITATIONS: meal prep, cleaning, laundry, interpersonal relationship, driving, shopping, community activity, and church  PERSONAL FACTORS: Age, Past/current experiences, Time since onset of injury/illness/exacerbation, Transportation, and 3+ comorbidities: ack pain, CBP, chronic L shoulder pain, galactorrhea, neuropathy, HPV, hypercholestermeia, HTN, MS, osteopenia, PONV, wrist fracture are also affecting patient's functional outcome.   REHAB POTENTIAL: Good  CLINICAL DECISION MAKING: Evolving/moderate complexity  EVALUATION COMPLEXITY: Moderate  PLAN:  PT FREQUENCY: 2x/week  PT DURATION: 12 weeks  PLANNED INTERVENTIONS: [id number]- PT Re-evaluation, 97110-Therapeutic exercises, 97530- Therapeutic activity, [id number]- Neuromuscular re-education, 97535- Self Care, [id number]- Manual therapy, [id number]- Gait training, [phone]- Orthotic Fit/training, [phone]- Canalith repositioning, [id number]- Aquatic Therapy, [phone]- Electrical stimulation (unattended), [id number]- Electrical stimulation (manual), [id number]- Ultrasound, [id number]- Traction (mechanical), Patient/Family education, Balance  training, Stair training, Taping, Dry Needling, Joint mobilization, Joint manipulation, Spinal manipulation, Spinal mobilization, Scar mobilization, Compression bandaging, Vestibular training, Visual/preceptual remediation/compensation, Cognitive remediation, DME instructions, Cryotherapy, Moist heat, and Biofeedback  PLAN FOR NEXT SESSION:  Strap stand frame with more dynamic activities Sit to stands; try supported walking, continue dynamic core stabilization training and seated reaching tasks to  improve weight shifting.    Fonda Simpers, PT, DPT Physical Therapist - [facility]  02/12/24, 11:55 AM

## 2024-02-14 ENCOUNTER — Ambulatory Visit: Payer: PPO

## 2024-02-14 ENCOUNTER — Ambulatory Visit

## 2024-02-14 DIAGNOSIS — R269 Unspecified abnormalities of gait and mobility: Secondary | ICD-10-CM

## 2024-02-14 DIAGNOSIS — G35D Multiple sclerosis, unspecified: Secondary | ICD-10-CM

## 2024-02-14 DIAGNOSIS — R278 Other lack of coordination: Secondary | ICD-10-CM

## 2024-02-14 DIAGNOSIS — M6281 Muscle weakness (generalized): Secondary | ICD-10-CM

## 2024-02-14 DIAGNOSIS — R2689 Other abnormalities of gait and mobility: Secondary | ICD-10-CM

## 2024-02-14 DIAGNOSIS — R262 Difficulty in walking, not elsewhere classified: Secondary | ICD-10-CM

## 2024-02-14 DIAGNOSIS — R2681 Unsteadiness on feet: Secondary | ICD-10-CM

## 2024-02-14 NOTE — Therapy (Signed)
 OUTPATIENT PHYSICAL THERAPY NEURO TREATMENT/PHYSICAL THERAPY PROGRESS NOTE   Dates of reporting period  01/10/24   to   02/14/24   Patient Name: Lisa Williams MRN: [id number] DOB:10/22/1960, 63 y.o., female Today's Date: 02/14/2024  PCP: Lenon Na  REFERRING PROVIDER: Lenon Na   END OF SESSION:  PT End of Session - 02/14/24 1020     Visit Number 60    Number of Visits 70    Date for Recertification  03/20/24   corrected   Progress Note Due on Visit 50    PT Start Time 1018    PT Stop Time 1100    PT Time Calculation (min) 42 min    Equipment Utilized During Treatment Gait belt    Activity Tolerance Patient tolerated treatment well;No increased pain    Behavior During Therapy [facility] for tasks assessed/performed          Past Medical History:  Diagnosis Date   Abdominal pain, right upper quadrant    Back pain    Calculus of kidney 12/09/2013   Chronic back pain    unspecified   Chronic left shoulder pain 07/19/2015   Complication of anesthesia    Functional disorder of bladder    other   Galactorrhea 11/26/2014   Chronic    Hereditary and idiopathic neuropathy 08/19/2013   History of kidney stones    HPV test positive    Hypercholesteremia 08/19/2013   Hypertension    Incomplete bladder emptying    Microscopic hematuria    MS (multiple sclerosis)    Muscle spasticity 05/21/2014   Nonspecific findings on examination of urine    other   Osteopenia    PONV (postoperative nausea and vomiting)    Status post laparoscopic supracervical hysterectomy 11/26/2014   Tobacco user 11/26/2014   Wrist fracture    Past Surgical History:  Procedure Laterality Date   bilateral tubal ligation  1996   BREAST CYST EXCISION Left 2002   CYST EXCISION Left 05/10/2022   Procedure: CYST REMOVAL;  Surgeon: Rodolph Romano, MD;  Location: [facility] ORS;  Service: General;  Laterality: Left;   FRACTURE SURGERY     KNEE SURGERY     right    LAPAROSCOPIC SUPRACERVICAL HYSTERECTOMY  08/05/2013   ORIF WRIST FRACTURE Left 01/17/2017   Procedure: OPEN REDUCTION INTERNAL FIXATION (ORIF) WRIST FRACTURE;  Surgeon: Leora Lynwood SAUNDERS, MD;  Location: [facility] OR[facility];  Service: Orthopedics;  Laterality: Left;   RADIOLOGY WITH ANESTHESIA N/A 03/18/2020   Procedure: MRI WITH ANESTHESIA CERVICAL SPINE AND BRAIN  WITH AND WITHOUT CONTRAST;  Surgeon: Radiologist, Medication, MD;  Location: [facility] OR;  Service: Radiology;  Laterality: N/A;   TUBAL LIGATION Bilateral    VAGINAL HYSTERECTOMY  03/2006   Patient Active Problem List   Diagnosis Date Noted   Hyperglycemia 01/09/2024   Abnormal LFTs (liver function tests) 09/17/2023   Sinus tachycardia 07/07/2023   Aortic atherosclerosis 01/18/2023   Prediabetes 01/18/2023   Hypokalemia 07/29/2022   Weakness of both lower extremities 07/29/2022   Abscess of left groin 11/15/2021   Abnormal LFTs 11/15/2021   Acute respiratory disease due to COVID-19 virus 11/21/2020   Weakness    Hypoalbuminemia due to protein-calorie malnutrition    Neurogenic bowel    Neurogenic bladder    Labile blood pressure    Neuropathic pain    Multiple sclerosis, relapsing-remitting 07/09/2019   Abscess of female pelvis    SVT (supraventricular tachycardia)    Radial styloid tenosynovitis 03/12/2018   Wheelchair dependence 02/27/2018  Localized osteoporosis with current pathological fracture with routine healing 01/19/2017   Wrist fracture 01/16/2017   Sprain of ankle 03/23/2016   Closed fracture of lateral malleolus 03/16/2016   Health care maintenance 01/24/2016   Blood pressure elevated without history of HTN 10/25/2015   Essential hypertension 10/25/2015   Multiple sclerosis 10/02/2015   Chronic left shoulder pain 07/19/2015   Multiple sclerosis exacerbation 07/14/2015   MS (multiple sclerosis) 11/26/2014   Increased body mass index 11/26/2014   HPV test positive 11/26/2014   Status post laparoscopic supracervical  hysterectomy 11/26/2014   Galactorrhea 11/26/2014   Back ache 05/21/2014   Adiposity 05/21/2014   Disordered sleep 05/21/2014   Muscle spasticity 05/21/2014   Spasticity 05/21/2014   Calculus of kidney 12/09/2013   Renal colic 12/09/2013   Hypercholesteremia 08/19/2013   Hereditary and idiopathic neuropathy 08/19/2013   Hypercholesterolemia without hypertriglyceridemia 08/19/2013   Bladder infection, chronic 07/25/2012   Disorder of bladder function 07/25/2012   Incomplete bladder emptying 07/25/2012   Microscopic hematuria 07/25/2012   Right upper quadrant pain 07/25/2012    ONSET DATE: 1995  REFERRING DIAG: MS  THERAPY DIAG:  Muscle weakness (generalized)  Other lack of coordination  Multiple sclerosis exacerbation  Difficulty in walking, not elsewhere classified  Unsteadiness on feet  Abnormality of gait and mobility  Other abnormalities of gait and mobility  Rationale for Evaluation and Treatment: Rehabilitation  SUBJECTIVE:                                                                                                                                                                                             SUBJECTIVE STATEMENT:  Pt reports that she got her approval for her standing frame.  Pt otherwise is doing well and denies any pain.    Pt accompanied by: self  PERTINENT HISTORY:  Patient is returning to PT s/p hospitalization.  s/p ORIF of R tibia shaft fracture 09/23/2022. Patient has weakness in BLE with RLE>LLE. She drives with hand controls. Patient has been diagnosed with MS in 1995. PMH includes: back pain, CBP, chronic L shoulder pain, galactorrhea, neuropathy, HPV, hypercholestermeia, HTN, MS, osteopenia, PONV, wrist fracture. Additional order for other closed fracture of proximal end of R tibia with routine healing. Still has to use [employer].   PAIN:  Are you having pain? Occasional pain in RLE; primarily in knee  PRECAUTIONS: Fall  RED  FLAGS: None   WEIGHT BEARING RESTRICTIONS: No  FALLS: Has patient fallen in last 6 months? No  LIVING ENVIRONMENT: Lives with: lives with their family Lives in: House/apartment Stairs: ramp Has following equipment at home: Vannie -  2 wheeled, Wheelchair (manual), shower chair, and Grab bars  PLOF: Independent with household mobility with device  PATIENT GOALS: to get her independence back. To be able to get into car, toilet, and get dressed independently  OBJECTIVE:  Note: Objective measures were completed at Evaluation unless otherwise noted.  DIAGNOSTIC FINDINGS: MRI of the brain 03/18/2020 showed multiple T2/FLAIR hyperintense foci in the periventricular, juxtacortical and deep white matter.  There were no infratentorial lesions noted.  None of the foci enhanced.   Due to severe claustrophobia, the study was done with conscious sedation in the hospital   COGNITION: Overall cognitive status: Within functional limits for tasks assessed   SENSATION: Lack of sensation in feet, loss in bilateral lateral aspect of knee  COORDINATION: Does not have the strength for functional LE heel slide test   MUSCLE TONE: BLE mild tone    POSTURE: rounded shoulders, forward head, anterior pelvic tilt, and weight shift left   LOWER EXTREMITY MMT:    MMT Right Eval Left Eval  Hip flexion 0.9 1.5  Hip extension    Hip abduction 1.8 2.6  Hip adduction 3.1 1.9  Hip internal rotation    Hip external rotation    Knee flexion 0.9 1.5  Knee extension 0.8 1.2  Ankle dorsiflexion    Ankle plantarflexion    Ankle inversion    Ankle eversion    (Blank rows = not tested)  BED MOBILITY:  Assess in future session due to limited time  TRANSFERS: Assistive device utilized: Bariatric RW with sit to stands, slide board to table  Sit to stand: unable to reach full stand Stand to sit: unable to reach full stand  Chair to chair: slide board with CGA   GAIT: Unable to ambulate at this time.    FUNCTIONAL TESTS:  Sit to stand: tricep press with BUE; unable to bring arm to walker.    Function In Sitting Test (FIST)  (1/2 femur on surface; hips/knees flexed to 90deg)   - indicate bed or mat table / step stool if used  SCORING KEY: 4 = Independent (completes task independently & successfully) 3 = Verbal Cues/Increased Time (completes task independently & successfully and only needs more time/cues) 2 = Upper Extremity Support (must use UE for support or assistance to complete successfully) 1 = Needs Assistance (unable to complete w/o physical assist; DOCUMENT LEVEL: min, mod, max) 0 = Dependent (requires complete physical assist; unable to complete successfully even w/ physical assist)  Randomly Administer Once Throughout Exam  4 - Anterior Nudge (superior sternum)  4 - Posterior Nudge (between scapular spines)  4 - Lateral Nudge (to dominant side at acromion)     4 - Static sitting (30 seconds)  4 - Sitting, shake 'no' (left and right)  4 - Sitting, eyes closed (30 seconds)   0 - Sitting, lift foot (dominant side, lift foot 1 inch twice)    2 - Pick up object from behind (object at midline, hands breadth posterior)  3 - Forward reach (use dominant arm, must complete full motion) 2 - Lateral reach (use dominant arm, clear opposite ischial tuberosity) 2 - Pick up object from floor (from between feet)   2 - Posterior scooting (move backwards 2 inches)  2 - Anterior scooting (move forward 2 inches)  2 - Lateral scooting (move to dominant side 2 inches)    TOTAL = 39/56  Notes/comments: slide board tranfer to/from table   MCD > 5 points MCID for IP REHAB > 6  points  Function In Sitting Test (FIST) 08/14/23 (1/2 femur on surface; hips/knees flexed to 90deg)   - indicate bed or mat table / step stool if used  SCORING KEY: 4 = Independent (completes task independently & successfully) 3 = Verbal Cues/Increased Time (completes task independently & successfully and only  needs more time/cues) 2 = Upper Extremity Support (must use UE for support or assistance to complete successfully) 1 = Needs Assistance (unable to complete w/o physical assist; DOCUMENT LEVEL: min, mod, max) 0 = Dependent (requires complete physical assist; unable to complete successfully even w/ physical assist)  Randomly Administer Once Throughout Exam  4 - Anterior Nudge (superior sternum)  4 - Posterior Nudge (between scapular spines)  4 - Lateral Nudge (to dominant side at acromion)     4 - Static sitting (30 seconds)  4 - Sitting, shake 'no' (left and right)  4 - Sitting, eyes closed (30 seconds)   0 - Sitting, lift foot (dominant side, lift foot 1 inch twice)    4 - Pick up object from behind (object at midline, hands breadth posterior)  4 - Forward reach (use dominant arm, must complete full motion) 4 - Lateral reach (use dominant arm, clear opposite ischial tuberosity)  - Pick up object from floor (from between feet)   3 - Posterior scooting (move backwards 2 inches)  3 - Anterior scooting (move forward 2 inches)  3 - Lateral scooting (move to dominant side 2 inches)    TOTAL = 47/56  MCD > 5 points MCID for IP REHAB > 6 points   TREATMENT DATE: 02/14/24  TherEx:  Seated shoulder abduction with 2# dumbbells, 2x10  Focus placed on keeping hands in line with arm maintaining 90 deg throughout Seated shoulder press with 2# dumbbells, 2x10 Seated resisted triceps curls, GB, 2x10 Seated lat lift offs in the chair, 2x5   Gait:  Pt ambulated for a total of 3 attempts, 25' on first attempt, 59' on the second attempt, 12' on the third attempt (42' 1 in total).  Pt utilized the bariatric FWW, pillow under the thighs for easier ability to come upright, and sock applied to the L foot to reduce friction of the foot in mobility.    PATIENT EDUCATION: Education details: Pt educated throughout session about proper posture and technique with exercises. Improved exercise  technique, movement at target joints, use of target muscles after min to mod verbal, visual, tactile cues.  Person educated: Patient Education method: Explanation, Demonstration, Tactile cues, and Verbal cues Education comprehension: verbalized understanding, returned demonstration, verbal cues required, tactile cues required, and needs further education  HOME EXERCISE PROGRAM: Stand 3x/day in stander   GOALS: Goals reviewed with patient? Yes  SHORT TERM GOALS: Target date: 08/08/2023  Patient will be independent in home exercise program to improve strength/mobility for better functional independence with ADLs.  Baseline: 4/8: compliance Goal status: MET    LONG TERM GOALS: Target date: 03/20/2024  Patient will tolerate five consecutive stands with UE support from wheelchair to standing to improve functional mobility with additional cushion and RW.  Baseline: unable to perform 4/8:unable to perform full stand 5/20: perform next session 5/29: two full stands with CGA and cushion on her seat 7/31: able to stand 3 times from [facility]. With no addition cushion  in parallel bars. 12/27/2023= Will retest next session- concentrated on just standing on past 2 visits Goal status: Ongoing   2.  Patient will ambulate 10 ft with bRW with wheelchair follow.  Baseline:  unable to ambulate 4/8: unable to ambulate 5/20: able to ambulate length of // bars in previous session 5/27: ambulate 2.5 lengths of // bars with two seated rest breaks with min A for weight shift and wheelchair follow  7/31: able to ambulate in parallel bars 3 ft with min assist and +2 follow in WC> difficulty advancing the LLE on this day. 12/27/2023- Patient unable to take a step forward today but was abel to take a step on previous visit- limited by clonus like trembling that has progressed over past month. Goal status: Ongoing   3.  Patient will improve FIST score >6 points to demonstrate improved stability and ability to perform ADLs.   Baseline:  3/6: 39/56 4/8: 47/ 56 5/20: 48/56 7/31:46/56 Goal status: MET  4.  Patient will perform toileting with mod I at home for improved independence.  Baseline: 3/6: requires assistance 4/8: requires dependence on machine 5/20: requires assistance with use of wipe buddy.  7/31: continues to require use of sara steady and wipe buddy for safety and hygiene. 12/27/2023- Patient reports that since she is unable to take steps that she continues to require use of sara steady and wipe buddy for safe personal hygiene. Goal status: ongoing   5. Patient will perform > 5 min of static or dynamic standing with BUE and Min/mod assist  for improved LE strength and pre-gait function to assist with toileting and overall standing ability for improve functional independence.  Baseline: 12/27/2023-Patient was able to stand for just over 3 min today. 01/10/24: Pt able to stand 1 min 38 sec 02/14/24: Pt able to >3 minutes at this time. Goal status: PROGRESSING    ASSESSMENT:  CLINICAL IMPRESSION:  Pt was able to ambulate today further than she has been able to in the past and performed well with the transfers today.  Pt was able to stand upright on all attempts without the need to perform multiple attempts to stand upright.  Pt also assessed for shoulder mobility and the L shoulder seems to be stiff in the GHJ and might need therapy to address the joint restriction.  Pt ultimately is making significant improvement towards goals at this time.  Patient's condition has the potential to improve in response to therapy. Maximum improvement is yet to be obtained. The anticipated improvement is attainable and reasonable in a generally predictable time.   Pt will continue to benefit from skilled therapy to address remaining deficits in order to improve overall QoL and return to PLOF.       OBJECTIVE IMPAIRMENTS: Abnormal gait, cardiopulmonary status limiting activity, decreased activity tolerance, decreased balance,  decreased coordination, decreased endurance, decreased mobility, difficulty walking, decreased ROM, decreased strength, hypomobility, increased fascial restrictions, impaired perceived functional ability, impaired flexibility, impaired sensation, improper body mechanics, and postural dysfunction.   ACTIVITY LIMITATIONS: carrying, lifting, bending, sitting, standing, squatting, sleeping, stairs, transfers, bed mobility, continence, bathing, toileting, dressing, self feeding, reach over head, hygiene/grooming, locomotion level, and caring for others  PARTICIPATION LIMITATIONS: meal prep, cleaning, laundry, interpersonal relationship, driving, shopping, community activity, and church  PERSONAL FACTORS: Age, Past/current experiences, Time since onset of injury/illness/exacerbation, Transportation, and 3+ comorbidities: ack pain, CBP, chronic L shoulder pain, galactorrhea, neuropathy, HPV, hypercholestermeia, HTN, MS, osteopenia, PONV, wrist fracture are also affecting patient's functional outcome.   REHAB POTENTIAL: Good  CLINICAL DECISION MAKING: Evolving/moderate complexity  EVALUATION COMPLEXITY: Moderate  PLAN:  PT FREQUENCY: 2x/week  PT DURATION: 12 weeks  PLANNED INTERVENTIONS: [id number]- PT Re-evaluation, 97110-Therapeutic exercises, 97530-  Therapeutic activity, [id number]- Neuromuscular re-education, [phone]- Self Care, [id number]- Manual therapy, [phone]- Gait training, [phone]- Orthotic Fit/training, [phone]- Canalith repositioning, [id number]- Aquatic Therapy, [phone]- Electrical stimulation (unattended), [phone]- Electrical stimulation (manual), [id number]- Ultrasound, [id number]- Traction (mechanical), Patient/Family education, Balance training, Stair training, Taping, Dry Needling, Joint mobilization, Joint manipulation, Spinal manipulation, Spinal mobilization, Scar mobilization, Compression bandaging, Vestibular training, Visual/preceptual remediation/compensation, Cognitive remediation, DME instructions, Cryotherapy, Moist  heat, and Biofeedback  PLAN FOR NEXT SESSION:  Strap stand frame with more dynamic activities Sit to stands; try supported walking, continue dynamic core stabilization training and seated reaching tasks to improve weight shifting.    Fonda Simpers, PT, DPT Physical Therapist - [facility]  02/14/24, 11:41 AM

## 2024-02-14 NOTE — Therapy (Signed)
 OUTPATIENT OCCUPATIONAL THERAPY NEURO TREATMENT NOTE  Patient Name: Lisa Williams MRN: [id number] DOB:03-06-1961, 63 y.o., female Today's Date: 02/14/2024  PCP: Dr. Layman Piety   Neurologist Dr. Suanne at [facility] REFERRING PROVIDER: Dr. Layman Piety    END OF SESSION:  OT End of Session - 02/14/24 1626     Visit Number 46    Number of Visits 67    Date for Recertification  04/29/24    Authorization Time Period Reporting period beginning 01/24/24    Progress Note Due on Visit 50    OT Start Time 1100    OT Stop Time 1145    OT Time Calculation (min) 45 min    Equipment Utilized During Treatment manual wc    Activity Tolerance Patient tolerated treatment well    Behavior During Therapy [facility] for tasks assessed/performed         Past Medical History:  Diagnosis Date   Abdominal pain, right upper quadrant    Back pain    Calculus of kidney 12/09/2013   Chronic back pain    unspecified   Chronic left shoulder pain 07/19/2015   Complication of anesthesia    Functional disorder of bladder    other   Galactorrhea 11/26/2014   Chronic    Hereditary and idiopathic neuropathy 08/19/2013   History of kidney stones    HPV test positive    Hypercholesteremia 08/19/2013   Hypertension    Incomplete bladder emptying    Microscopic hematuria    MS (multiple sclerosis)    Muscle spasticity 05/21/2014   Nonspecific findings on examination of urine    other   Osteopenia    PONV (postoperative nausea and vomiting)    Status post laparoscopic supracervical hysterectomy 11/26/2014   Tobacco user 11/26/2014   Wrist fracture    Past Surgical History:  Procedure Laterality Date   bilateral tubal ligation  1996   BREAST CYST EXCISION Left 2002   CYST EXCISION Left 05/10/2022   Procedure: CYST REMOVAL;  Surgeon: Rodolph Romano, MD;  Location: [facility] ORS;  Service: General;  Laterality: Left;   FRACTURE SURGERY     KNEE SURGERY     right   LAPAROSCOPIC  SUPRACERVICAL HYSTERECTOMY  08/05/2013   ORIF WRIST FRACTURE Left 01/17/2017   Procedure: OPEN REDUCTION INTERNAL FIXATION (ORIF) WRIST FRACTURE;  Surgeon: Leora Lynwood SAUNDERS, MD;  Location: [facility] ORS;  Service: Orthopedics;  Laterality: Left;   RADIOLOGY WITH ANESTHESIA N/A 03/18/2020   Procedure: MRI WITH ANESTHESIA CERVICAL SPINE AND BRAIN  WITH AND WITHOUT CONTRAST;  Surgeon: Radiologist, Medication, MD;  Location: [facility] OR;  Service: Radiology;  Laterality: N/A;   TUBAL LIGATION Bilateral    VAGINAL HYSTERECTOMY  03/2006   Patient Active Problem List   Diagnosis Date Noted   Hyperglycemia 01/09/2024   Abnormal LFTs (liver function tests) 09/17/2023   Sinus tachycardia 07/07/2023   Aortic atherosclerosis 01/18/2023   Prediabetes 01/18/2023   Hypokalemia 07/29/2022   Weakness of both lower extremities 07/29/2022   Abscess of left groin 11/15/2021   Abnormal LFTs 11/15/2021   Acute respiratory disease due to COVID-19 virus 11/21/2020   Weakness    Hypoalbuminemia due to protein-calorie malnutrition    Neurogenic bowel    Neurogenic bladder    Labile blood pressure    Neuropathic pain    Multiple sclerosis, relapsing-remitting 07/09/2019   Abscess of female pelvis    SVT (supraventricular tachycardia)    Radial styloid tenosynovitis 03/12/2018   Wheelchair dependence 02/27/2018   Localized  osteoporosis with current pathological fracture with routine healing 01/19/2017   Wrist fracture 01/16/2017   Sprain of ankle 03/23/2016   Closed fracture of lateral malleolus 03/16/2016   Health care maintenance 01/24/2016   Blood pressure elevated without history of HTN 10/25/2015   Essential hypertension 10/25/2015   Multiple sclerosis 10/02/2015   Chronic left shoulder pain 07/19/2015   Multiple sclerosis exacerbation 07/14/2015   MS (multiple sclerosis) 11/26/2014   Increased body mass index 11/26/2014   HPV test positive 11/26/2014   Status post laparoscopic supracervical hysterectomy  11/26/2014   Galactorrhea 11/26/2014   Back ache 05/21/2014   Adiposity 05/21/2014   Disordered sleep 05/21/2014   Muscle spasticity 05/21/2014   Spasticity 05/21/2014   Calculus of kidney 12/09/2013   Renal colic 12/09/2013   Hypercholesteremia 08/19/2013   Hereditary and idiopathic neuropathy 08/19/2013   Hypercholesterolemia without hypertriglyceridemia 08/19/2013   Bladder infection, chronic 07/25/2012   Disorder of bladder function 07/25/2012   Incomplete bladder emptying 07/25/2012   Microscopic hematuria 07/25/2012   Right upper quadrant pain 07/25/2012   ONSET DATE: 5/24 Amon and broke R leg which caused beginning of ADL decline; diagnosed with MS in 1995)  REFERRING DIAG: MS  THERAPY DIAG:  Muscle weakness (generalized)  Other lack of coordination  Multiple sclerosis exacerbation  Rationale for Evaluation and Treatment: Rehabilitation  SUBJECTIVE:  SUBJECTIVE STATEMENT: Pt denies R knee pain and numbness in the foot today.  Feeling better today than last session. Pt accompanied by: self, pt's mother  PERTINENT HISTORY: Pt reports increased difficulty with basic self care tasks since breaking her R leg last May.  Since that time, mobility and ADLs have declined, with pt requiring assist from private caregivers and family to manage bathing, toileting, dressing, and functional transfers.  PRECAUTIONS: Fall  WEIGHT BEARING RESTRICTIONS: No  PAIN: 02/14/24: No pain today  Are you having pain? No; occasional pain in R lower back, but not today  FALLS: Has patient fallen in last 6 months? No Last fall was May 17 of 2024  LIVING ENVIRONMENT: Lives with: lives with their family (including mother who has dementia and sister [name])  Lives in: 2 level home but pt resides on main level Stairs: ramp  Has following equipment at home: Wheelchair (manual), Shower bench, and hand held shower shower, 1 grab bar in the shower, 3in1 commode, sit to stand lift (electric), FWW,  hospital bed  PLOF: modified indep with ADL/IADLs prior to May of 7975   PATIENT GOALS: Increase independence with basic self care tasks  OBJECTIVE:  Note: Objective measures were completed at Evaluation unless otherwise noted.  HAND DOMINANCE: Right  ADLs:  Overall ADLs: assist provided from paid caregiver and sister Transfers/ambulation related to ADLs: sit to stand lift for all transfers, set up for sliding board in/out of bed  Eating: indep  Grooming: modified indep (wc level in mom's bathroom)  UB Dressing: set up (can't access her bedroom closet)  LB Dressing: able to sit on side of bed to don all LB clothing; assist to hike pants/underwear Toileting: assist with clothing management d/t standing within electric lift Bathing: bed bath with sister helping to wash backside Tub Shower transfers: N/A; pt has walk in shower in mom's bathroom (wc does fit in this bathroom but pt reports inability to transfer from wc and requires arm rests on a bench for a successful transfer attempt from any DME).  Tub bench is in pt's bathroom, but lift does not fit through bathroom doorway and pt  reports inability to transfer up from tub bench (currently unable to manage either transfer)   Equipment: see above  IADLs: Shopping: [employer] for shopping Light housekeeping: modified indep from wc level Meal Prep: modified indep from wc level Community mobility: relies on community transportation or family members Medication management: indep Landscape architect: indep Handwriting: NT; pt denies any FMC challenges  MOBILITY STATUS: Hx of falls  POSTURE COMMENTS:  Rounded shoulders, forward head, anterior pelvic tilt, and weight shift left   ACTIVITY TOLERANCE: Eval: Activity tolerance: Per PT; currently standing 30-60 sec within Light Gait harness/lift; currently non-ambulatory  01/24/24: Standing at sink: 1 minute; min A and 5 attempts to achieve fully erect standing position with BUEs  supported on sink countertop, OT assist to block R knee to prevent buckling   UPPER EXTREMITY ROM:  BUEs WFL  UPPER EXTREMITY MMT:     MMT Right eval Left eval  Shoulder flexion 4+ 4  Shoulder abduction 4+ 4  Shoulder adduction    Shoulder extension    Shoulder internal rotation 4+ 4+  Shoulder external rotation 4 4  Middle trapezius    Lower trapezius    Elbow flexion 4+ 4+  Elbow extension 4+ 4+  Wrist flexion 4+ 4+  Wrist extension 4+ 4+  Wrist ulnar deviation    Wrist radial deviation    Wrist pronation    Wrist supination    (Blank rows = not tested)  HAND FUNCTION/COORDINATION:  R/L WNL; pt denies any coordination deficits/ able to manipulate pills/clothing fasteners/etc without difficulty  11/15/23: *Measures taken d/t PT note revealing pt with increased difficulty manipulating smaller ADL supplies during reaching activities in PT session   11/15/23: Grip strength: R: 70 lbs, L: 63 lbs Pinch strength: Lateral: R: 16 lbs, L: 15 lbs; 3 point pinch: R: 12 lbs, L: 13 lbs 9 hole peg test: Right: 25 sec, L 49 sec   12/25/23: Grip strength: Right: 70 lbs; Left: 65 lbs  9 hole peg test: Right: 24 sec; Left: 3 trials: 54 sec, 1 min, 56 sec  01/24/24: Grip grip: Right: 76 lbs; Left: 67 lbs Lateral pinch: R: 17 lbs, L: 17 lbs; 3 point pinch: R: 13 lbs, L: 14 lbs  9 Hole Peg Test: R: 26 sec, L: 50 sec  SENSATION: WFL  EDEMA: No visible edema in BUEs  MUSCLE TONE: BUEs WNL  COGNITION: Overall cognitive status: Within functional limits for tasks assessed  VISION: wears glasses all the time, no reports of diplopia  PERCEPTION: Not tested  PRAXIS: WFL  OBSERVATIONS:  Pt pleasant, cooperative, and motivated to work towards improving indep with ADLs.                                                                                                     TREATMENT DATE: 02/14/24 Self Care -Scoot pivot transfer wc>mat table without sliding board; vc for increasing forward  weight shift and engaging BLEs for increasing buttocks rise from wc; Min A for foot adjustment mid transfer.  Sliding board used to transfer back to wc end of session d/t  increased fatigue. -Sit to stand trials in prep for standing ADLs at sink countertop.  Pt sat on edge of elevated mat table; 1 hand pushing from mat with the other placed on walker.  OT provided manual assist to block knees and provided vc for increasing forward weight to work towards erect standing position.  Therapeutic Activity: -Facilitated core stability with ball toss and catch while pt sat on edge of mat table.  Pillow placed beneath buttocks to increase challenge and simulate pt's mattress at home. 2 occasions of posterior LOB, requiring min A for return to midline.  PATIENT EDUCATION: Education details: STS transfer technique Person educated: Patient Education method: Explanation, Demonstration, Tactile cues, and Verbal cues Education comprehension: verbalized understanding and needs further education  HOME EXERCISE PROGRAM: IR A/AAROM to promote shoulder flexibility for peri care/bathing  GOALS: Goals reviewed with patient? Yes  SHORT TERM GOALS: Target date: 12/25/23  Pt will perform bed bath with set up only. Baseline: Eval: Min-mod A for posterior washing; 09/28/23: Min A for posterior washing; 11/13/23: Able to manage bed bath but sister helps with thoroughness, per pt's preference; pt in agreement to trial bath sitting EOB or in wc before next OT session. Goal status: achieved/d/c  2.  Pt will utilize sliding board for wc<>drop arm commode transfer with SBA. Baseline: Eval: Currently using electric lift for transfer to [facility]; 09/28/23: Not yet completed at home but transfers to commode have been easier without sliding board d/t pt implementing a scoot pivot. Goal status: d/c (pt does not prefer sliding board for commode transfer)  3.  Pt will be indep to perform HEP for maintaining BUE strength for ADLs and  functional transfers. Baseline: Eval: HEP not yet initiated; 09/28/23: Pt is working on core stability exercises from wc, cane stretches for bilat shoulder IR, and tricep extension via wc pushups; 11/13/23: indep Goal status: achieved  LONG TERM GOALS: Target date: 02/05/24  Pt will perform sink bath with set up A. (Revised on 11/13/23 from min A to set up) Baseline: Eval: Currently bed bathing d/t inability to stand at sink without electric lift (lift does not fit into pt's bathroom); 09/28/23: still completing bed bath as pt is not yet able to stand; 11/13/23: Not yet attempted; pt encouraged to attempt before next OT session now that bed bath goal has been met; 12/20/23: Pt is now completing sponge bath with min A while seated EOB (caregiver assists with washing peri area d/t no bed rail to aid in R lateral lean); 01/24/24: Performing with set up on EOB, completing lateral weight shifts to wash posterior/peri area with wc in front of pt and bed rail for LUE support and foot of bed for RUE support  Goal status: achieved  2.  Pt will perform wc<>tub bench transfer with min A. Baseline: Eval: Unable; 09/28/23: Performed in OT clinic with min A, but not yet in the home Goal status: d/c (pt prefers sink bath)  3.  Pt will perform squat pivot transfer wc<>BSC with SBA.  Baseline: Eval: Eval: Currently using electric lift for transfer to [facility]; 09/28/23: Not yet completed in clinic with [facility], and using lift for [facility] in the home still; 11/13/23: Still using Camie lift at home and in clinic transfers are all scoot rather than squat pivots d/t LE weakness; 12/20/23: Pt reports that she plans to use her Camie lift until she can ambulate to her [facility] d/t limited space for wc in her desired location for [facility] at home. Goal status:  d/c  4. Pt will perform scoot transfer wc<>toilet using grab bars with supv to allow toileting in community setting.  Baseline: Eval: Pt limits community outings d/t requiring use of lift for [facility]  transfers; 09/28/23: Pt has performed 2 successful scoot pivot  Transfers to toilet in OT clinic with min A, extra time.  Further trials with clothing management on toilet needed, but pt can lower and hike  pants while sititng edge of mat via lateral leaning and min A to maintain core stability; 11/13/23: Pt performs with CGA and extra time; 12/19/23: Pt  performs with close supv on most attempts, occasional CGA; efficiency is improving; 01/24/24: ongoing improvements; able to complete in fewer scoots (3 scoots each way from wc<>toilet) d/t improve engagement of Les and improved anterior weight shift; occasional min guard-supv; pt completes with therapist in clinic, but not yet in other community settings with sister, and not yet voided on toilet in community    Goal status: ongoing  5.  Pt will complete seated clothing management with min A to enable voiding in handicapped stall within community. Baseline: Recert 11/13/23: Pt can transfer to toilet with increased time and effort, but has not yet attempted clothing management or voiding while seated on commode in community setting.  Pt can lower pants to knees while seated in wc, but requires mod A to hike over hips from seated position; 12/20/23: Pt fully lowers panties and shorts while seated on commode with supv, can hike clothing to her her upper thighs, transfers back to wc, and then performs a tricep extension from wc level to allow caregiver to hike clothing remainder of the way for purposes of EC (min A for clothing management component) Goal status: achieved  6.  Pt will tolerate standing x1 min with close supv and BUE support to manage clothing in prep for toileting. Baseline: Eval: Currently standing in electric lift only, or within parallel bars without lift; 09/28/23: Not yet attempted; 11/13/23: Pt is able to perform static standing with heavy BUE support on parallel bars with PT; 12/19/22: Not attempted recently d/t new intermittent clonus in BLEs;  01/24/24: Tolerated standing 1 min today at sink with min A for ascent, OT assist to block R knee from buckling Goal status: in progress  7.  Pt will increase bilat grip strength in order to securely grasp walker sufficiently for functional transfers and ambulation.  Baseline: Recert 11/13/23: TBD (PT note read following OT session, indicating pt with difficulty securing items in hand);  11/15/23: Grip strength: R: 70 lbs, L: 63 lbs; 12/25/23: R 76 lbs, L 67 lbs; sufficient for functional transfers/amb  Goal status: achieved  8.  Pt will increase L [facility] skills in order to manipulate small ADL supplies with reduced dropping as indicated by 20 sec or more improvement in 9 hole peg test. Baseline: Recert 11/13/23: 9 hole peg test TBD (PT note read following OT session, indicating pt with difficulty manipulating and securing items in hands); 11/15/23: Right: 25 sec, L 49 sec; 12/25/23: Right: 24 sec; Left: 3 trials: 54 sec, 1 min, 56 sec; 01/24/24: R: 26 sec, L: 50 sec  Goal status: in progress  ASSESSMENT: CLINICAL IMPRESSION: No pain in knee today and pt reports no numbness in R foot and pt agreeable to sit to stand trials in prep for standing at sink for Adls.  Pt practiced from elevated mat table, requiring vc for increasing forward weight shift.  Able to achieve ~50% of erect standing at least 3  times using RW and OT providing min A to block knees.  Pt required min A on 2 occasions after posterior LOB when reaching to catch a ball when core stability challenge was increased by having pt sit on pillow on edge of mat table.  Pt will continue to benefit from skilled OT to work towards above noted goals in OT poc, working to maximize indep with daily tasks while reducing burden of care on caregivers.   PERFORMANCE DEFICITS: in functional skills including ADLs, IADLs, strength, pain, flexibility, Gross motor control, mobility, balance, body mechanics, endurance, and decreased knowledge of use of DME, and  psychosocial skills including coping strategies, environmental adaptation, habits, and routines and behaviors.   IMPAIRMENTS: are limiting patient from ADLs, IADLs, and social participation.   CO-MORBIDITIES: has co-morbidities such as neuropathy, back pain, obesity, MS, HTN that affects occupational performance. Patient will benefit from skilled OT to address above impairments and improve overall function.  MODIFICATION OR ASSISTANCE TO COMPLETE EVALUATION: No modification of tasks or assist necessary to complete an evaluation.  OT OCCUPATIONAL PROFILE AND HISTORY: Detailed assessment: Review of records and additional review of physical, cognitive, psychosocial history related to current functional performance.  CLINICAL DECISION MAKING: Moderate - several treatment options, min-mod task modification necessary  REHAB POTENTIAL: Good  EVALUATION COMPLEXITY: Moderate  PLAN:  OT FREQUENCY: 2x/week  OT DURATION: 12 weeks  PLANNED INTERVENTIONS: 97168 OT Re-evaluation, 97535 self care/ADL training, 02889 therapeutic exercise, 97530 therapeutic activity, 97112 neuromuscular re-education, 97140 manual therapy, 97116 gait training, 02989 moist heat, 97010 cryotherapy, balance training, functional mobility training, psychosocial skills training, energy conservation, coping strategies training, patient/family education, and DME and/or AE instructions  RECOMMENDED OTHER SERVICES: None at this time (Pt currently receiving PT services in this clinic)  CONSULTED AND AGREED WITH PLAN OF CARE: Patient  PLAN FOR NEXT SESSION: see above  Inocente Blazing, MS, OTR/L  Inocente MARLA Blazing, OT 02/14/2024, 4:28 PM

## 2024-02-17 NOTE — Progress Notes (Unsigned)
 Office: [phone]  /  Fax: [phone]   Initial Visit    Lisa Williams was seen in clinic today to evaluate for obesity. She is interested in losing weight to improve overall health and reduce the risk of weight related complications. She presents today to review program treatment options, initial physical assessment, and evaluation.     She was referred by: {emreferby:28303}  When asked what else they would like to accomplish? She states: {EMHopetoaccomplish:28304::Adopt a healthier eating pattern and lifestyle,Improve energy levels and physical activity,Improve existing medical conditions,Improve quality of life}  When asked how has your weight affected you? She states: {EMWeightAffected:28305}  Weight history: ***  Highest weight: ***  Some associated conditions: {EMSomeConditions:28306}  Contributing factors: {EMcontributingfactors:28307}  Weight promoting medications identified: {EMWeightpromotingrx:28308}  Prior weight loss attempts: {emweightlossprograms:31590::None}  Current nutrition plan: {EMNutritionplan:28309::None}  Current level of physical activity: {EMcurrentPA:28310::None}  Current or previous pharmacotherapy: {EM previousRx:28311}  Response to medication: {EMResponsetomedication:28312}   Past medical history includes:   Past Medical History:  Diagnosis Date   Abdominal pain, right upper quadrant    Back pain    Calculus of kidney 12/09/2013   Chronic back pain    unspecified   Chronic left shoulder pain 07/19/2015   Complication of anesthesia    Functional disorder of bladder    other   Galactorrhea 11/26/2014   Chronic    Hereditary and idiopathic neuropathy 08/19/2013   History of kidney stones    HPV test positive    Hypercholesteremia 08/19/2013   Hypertension    Incomplete bladder emptying    Microscopic hematuria    MS (multiple sclerosis)    Muscle spasticity 05/21/2014   Nonspecific findings on  examination of urine    other   Osteopenia    PONV (postoperative nausea and vomiting)    Status post laparoscopic supracervical hysterectomy 11/26/2014   Tobacco user 11/26/2014   Wrist fracture      Objective    There were no vitals taken for this visit. She was weighed on the bioimpedance scale: There is no height or weight on file to calculate BMI.  Body Fat%:***, Visceral Fat Rating:***, Weight trend over the last 12 months: {emweighttrend:28333}  General:  Alert, oriented and cooperative. Patient is in no acute distress.  Respiratory: Normal respiratory effort, no problems with respiration noted   Gait: able to ambulate independently  Mental Status: Normal mood and affect. Normal behavior. Normal judgment and thought content.   DIAGNOSTIC DATA REVIEWED:  BMET    Component Value Date/Time   NA 140 01/09/2024 1124   NA 143 03/03/2021 1128   NA 139 02/15/2013 1700   K 4.0 01/09/2024 1124   K 3.8 02/15/2013 1700   CL 105 01/09/2024 1124   CL 109 (H) 02/15/2013 1700   CO2 30 01/09/2024 1124   CO2 25 02/15/2013 1700   GLUCOSE 82 01/09/2024 1124   GLUCOSE 94 02/15/2013 1700   BUN 18 01/09/2024 1124   BUN 21 03/03/2021 1128   BUN 20 (H) 02/15/2013 1700   CREATININE 0.50 01/09/2024 1124   CREATININE 0.59 (L) 02/15/2013 1700   CALCIUM  9.1 01/09/2024 1124   CALCIUM  9.0 02/15/2013 1700   GFRNONAA >60 07/08/2023 0620   GFRNONAA >60 02/15/2013 1700   GFRAA 110 05/13/2020 1146   GFRAA >60 02/15/2013 1700   Lab Results  Component Value Date   HGBA1C 6.1 01/09/2024   HGBA1C 5.1 08/01/2011   No results found for: INSULIN  CBC    Component Value Date/Time  WBC 6.2 01/09/2024 1124   RBC 4.70 01/09/2024 1124   HGB 12.5 01/09/2024 1124   HGB 11.9 12/17/2023 1549   HCT 38.0 01/09/2024 1124   HCT 35.3 12/17/2023 1549   PLT 216.0 01/09/2024 1124   PLT 185 12/17/2023 1549   MCV 80.8 01/09/2024 1124   MCV 85 12/17/2023 1549   MCV 79 (L) 02/15/2013 1700   MCH 28.7  12/17/2023 1549   MCH 26.6 07/08/2023 0620   MCHC 33.0 01/09/2024 1124   RDW 15.3 01/09/2024 1124   RDW 15.3 12/17/2023 1549   RDW 13.6 02/15/2013 1700   Iron/TIBC/Ferritin/ %Sat    Component Value Date/Time   IRON 71 08/07/2019 0450   TIBC 253 08/07/2019 0450   FERRITIN 36 08/07/2019 0450   IRONPCTSAT 28 08/07/2019 0450   Lipid Panel     Component Value Date/Time   CHOL 136 01/09/2024 1124   CHOL 187 11/29/2016 0000   TRIG 73.0 01/09/2024 1124   HDL 43.20 01/09/2024 1124   HDL 55 11/29/2016 0000   CHOLHDL 3 01/09/2024 1124   VLDL 14.6 01/09/2024 1124   LDLCALC 78 01/09/2024 1124   LDLCALC 119 (H) 11/29/2016 0000   Hepatic Function Panel     Component Value Date/Time   PROT 6.7 01/09/2024 1124   PROT 6.5 12/17/2023 1549   PROT 7.2 02/15/2013 1700   ALBUMIN 3.9 01/09/2024 1124   ALBUMIN 3.8 (L) 12/17/2023 1549   ALBUMIN 3.6 02/15/2013 1700   AST 12 01/09/2024 1124   AST 20 02/15/2013 1700   ALT 10 01/09/2024 1124   ALT 15 02/15/2013 1700   ALKPHOS 119 (H) 01/09/2024 1124   ALKPHOS 127 02/15/2013 1700   BILITOT 0.5 01/09/2024 1124   BILITOT 0.2 12/17/2023 1549   BILITOT 0.3 02/15/2013 1700   BILIDIR 0.1 01/09/2024 1124   BILIDIR 0.11 12/17/2023 1549      Component Value Date/Time   TSH 23.43 (H) 01/09/2024 1124     Assessment and Plan   Hypercholesteremia  Essential hypertension  Multiple sclerosis  Hyperglycemia  Abnormal LFTs  Aortic atherosclerosis  SVT (supraventricular tachycardia)  Wheelchair dependence  Status post laparoscopic supracervical hysterectomy  Calculus of kidney  Hereditary and idiopathic neuropathy    Assessment and Plan Assessment & Plan         Obesity Treatment / Action Plan:  {EMobesityactionplanscribe:28314::Patient will work on garnering support from family and friends to begin weight loss journey.,Will work on eliminating or reducing the presence of highly palatable, calorie dense foods in the  home.,Will complete provided nutritional and psychosocial assessment questionnaire before the next appointment.,Will be scheduled for indirect calorimetry to determine resting energy expenditure in a fasting state.  This will allow us  to create a reduced calorie, high-protein meal plan to promote loss of fat mass while preserving muscle mass.,Counseled on the health benefits of losing 5%-15% of total body weight.,Was counseled on nutritional approaches to weight loss and benefits of reducing processed foods and consuming plant-based foods and high quality protein as part of nutritional weight management.,Was counseled on pharmacotherapy and role as an adjunct in weight management. }  Obesity Education Performed Today:  She was weighed on the bioimpedance scale and results were discussed and documented in the synopsis.  We discussed obesity as a disease and the importance of a more detailed evaluation of all the factors contributing to the disease.  We discussed the importance of long term lifestyle changes which include nutrition, exercise and behavioral modifications as well as the importance of  customizing this to her specific health and social needs.  We discussed the benefits of reaching a healthier weight to alleviate the symptoms of existing conditions and reduce the risks of the biomechanical, metabolic and psychological effects of obesity.  We reviewed the four pillars of obesity medicine and importance of using a multimodal approach.  We reviewed the basic principles in weight management.   Lisa Williams appears to be in the action stage of change and states they are ready to start intensive lifestyle modifications and behavioral modifications.  I have spent *** minutes in the care of the patient today including: {NUMBER 1-10:22536} minutes before the visit reviewing and preparing the chart. *** minutes face-to-face {emfacetoface:32598::assessing and reviewing  listed medical problems as outlined in obesity care plan,providing nutritional and behavioral counseling on topics outlined in the obesity care plan,independently interpreting test results and goals of care, as described in assessment and plan,reviewing and discussing biometric information and progress} {NUMBER 1-10:22536} minutes after the visit updating chart and documentation of encounter.  Reviewed by clinician on day of visit: allergies, medications, problem list, medical history, surgical history, family history, social history, and previous encounter notes pertinent to obesity diagnosis.   Lucas Parker, MD

## 2024-02-18 ENCOUNTER — Encounter (INDEPENDENT_AMBULATORY_CARE_PROVIDER_SITE_OTHER): Payer: Self-pay | Admitting: Physician Assistant

## 2024-02-18 ENCOUNTER — Ambulatory Visit (INDEPENDENT_AMBULATORY_CARE_PROVIDER_SITE_OTHER): Admitting: Physician Assistant

## 2024-02-18 VITALS — BP 177/78 | HR 58 | Temp 97.8°F | Ht 71.0 in | Wt 244.5 lb

## 2024-02-18 DIAGNOSIS — N200 Calculus of kidney: Secondary | ICD-10-CM

## 2024-02-18 DIAGNOSIS — Z90711 Acquired absence of uterus with remaining cervical stump: Secondary | ICD-10-CM

## 2024-02-18 DIAGNOSIS — Z6834 Body mass index (BMI) 34.0-34.9, adult: Secondary | ICD-10-CM

## 2024-02-18 DIAGNOSIS — G35D Multiple sclerosis, unspecified: Secondary | ICD-10-CM | POA: Diagnosis not present

## 2024-02-18 DIAGNOSIS — I700 Atherosclerosis of aorta: Secondary | ICD-10-CM

## 2024-02-18 DIAGNOSIS — R7303 Prediabetes: Secondary | ICD-10-CM

## 2024-02-18 DIAGNOSIS — E66811 Obesity, class 1: Secondary | ICD-10-CM | POA: Diagnosis not present

## 2024-02-18 DIAGNOSIS — Z0289 Encounter for other administrative examinations: Secondary | ICD-10-CM

## 2024-02-18 DIAGNOSIS — Z7409 Other reduced mobility: Secondary | ICD-10-CM | POA: Diagnosis not present

## 2024-02-18 DIAGNOSIS — R5381 Other malaise: Secondary | ICD-10-CM | POA: Diagnosis not present

## 2024-02-18 DIAGNOSIS — R5383 Other fatigue: Secondary | ICD-10-CM | POA: Diagnosis not present

## 2024-02-18 DIAGNOSIS — R7989 Other specified abnormal findings of blood chemistry: Secondary | ICD-10-CM

## 2024-02-18 DIAGNOSIS — G609 Hereditary and idiopathic neuropathy, unspecified: Secondary | ICD-10-CM

## 2024-02-18 DIAGNOSIS — E039 Hypothyroidism, unspecified: Secondary | ICD-10-CM | POA: Diagnosis not present

## 2024-02-18 DIAGNOSIS — Z993 Dependence on wheelchair: Secondary | ICD-10-CM | POA: Diagnosis not present

## 2024-02-18 DIAGNOSIS — I471 Supraventricular tachycardia, unspecified: Secondary | ICD-10-CM

## 2024-02-18 DIAGNOSIS — R739 Hyperglycemia, unspecified: Secondary | ICD-10-CM

## 2024-02-18 DIAGNOSIS — E7800 Pure hypercholesterolemia, unspecified: Secondary | ICD-10-CM

## 2024-02-18 DIAGNOSIS — I10 Essential (primary) hypertension: Secondary | ICD-10-CM

## 2024-02-19 ENCOUNTER — Ambulatory Visit (INDEPENDENT_AMBULATORY_CARE_PROVIDER_SITE_OTHER): Admitting: *Deleted

## 2024-02-19 ENCOUNTER — Ambulatory Visit

## 2024-02-19 ENCOUNTER — Ambulatory Visit: Payer: PPO

## 2024-02-19 VITALS — Ht 71.0 in | Wt 244.0 lb

## 2024-02-19 DIAGNOSIS — R262 Difficulty in walking, not elsewhere classified: Secondary | ICD-10-CM

## 2024-02-19 DIAGNOSIS — Z780 Asymptomatic menopausal state: Secondary | ICD-10-CM

## 2024-02-19 DIAGNOSIS — M6281 Muscle weakness (generalized): Secondary | ICD-10-CM

## 2024-02-19 DIAGNOSIS — R278 Other lack of coordination: Secondary | ICD-10-CM

## 2024-02-19 DIAGNOSIS — R2689 Other abnormalities of gait and mobility: Secondary | ICD-10-CM

## 2024-02-19 DIAGNOSIS — Z0000 Encounter for general adult medical examination without abnormal findings: Secondary | ICD-10-CM

## 2024-02-19 DIAGNOSIS — R269 Unspecified abnormalities of gait and mobility: Secondary | ICD-10-CM

## 2024-02-19 DIAGNOSIS — G35D Multiple sclerosis, unspecified: Secondary | ICD-10-CM

## 2024-02-19 DIAGNOSIS — Z1231 Encounter for screening mammogram for malignant neoplasm of breast: Secondary | ICD-10-CM

## 2024-02-19 DIAGNOSIS — R2681 Unsteadiness on feet: Secondary | ICD-10-CM

## 2024-02-19 NOTE — Therapy (Signed)
 OUTPATIENT OCCUPATIONAL THERAPY NEURO TREATMENT NOTE  Patient Name: Lisa Williams MRN: [id number] DOB:12/14/1960, 63 y.o., female Today's Date: 02/19/2024  PCP: Dr. Layman Piety   Neurologist Dr. Suanne at [facility] REFERRING PROVIDER: Dr. Layman Piety    END OF SESSION:  OT End of Session - 02/19/24 1156     Visit Number 47    Number of Visits 67    Date for Recertification  04/29/24    Authorization Time Period Reporting period beginning 01/24/24    Progress Note Due on Visit 50    OT Start Time 1100    OT Stop Time 1145    OT Time Calculation (min) 45 min    Equipment Utilized During Treatment manual wc    Activity Tolerance Patient tolerated treatment well    Behavior During Therapy [facility] for tasks assessed/performed         Past Medical History:  Diagnosis Date   Abdominal pain, right upper quadrant    Back pain    Calculus of kidney 12/09/2013   Chronic back pain    unspecified   Chronic left shoulder pain 07/19/2015   Complication of anesthesia    Functional disorder of bladder    other   Galactorrhea 11/26/2014   Chronic    Hereditary and idiopathic neuropathy 08/19/2013   History of kidney stones    HPV test positive    Hypercholesteremia 08/19/2013   Hypertension    Incomplete bladder emptying    Microscopic hematuria    MS (multiple sclerosis)    Muscle spasticity 05/21/2014   Nonspecific findings on examination of urine    other   Osteopenia    PONV (postoperative nausea and vomiting)    Status post laparoscopic supracervical hysterectomy 11/26/2014   Tobacco user 11/26/2014   Wrist fracture    Past Surgical History:  Procedure Laterality Date   bilateral tubal ligation  1996   BREAST CYST EXCISION Left 2002   CYST EXCISION Left 05/10/2022   Procedure: CYST REMOVAL;  Surgeon: Rodolph Romano, MD;  Location: [facility] ORS;  Service: General;  Laterality: Left;   FRACTURE SURGERY     KNEE SURGERY     right   LAPAROSCOPIC  SUPRACERVICAL HYSTERECTOMY  08/05/2013   ORIF WRIST FRACTURE Left 01/17/2017   Procedure: OPEN REDUCTION INTERNAL FIXATION (ORIF) WRIST FRACTURE;  Surgeon: Leora Lynwood SAUNDERS, MD;  Location: [facility] ORS;  Service: Orthopedics;  Laterality: Left;   RADIOLOGY WITH ANESTHESIA N/A 03/18/2020   Procedure: MRI WITH ANESTHESIA CERVICAL SPINE AND BRAIN  WITH AND WITHOUT CONTRAST;  Surgeon: Radiologist, Medication, MD;  Location: [facility] OR;  Service: Radiology;  Laterality: N/A;   TUBAL LIGATION Bilateral    VAGINAL HYSTERECTOMY  03/2006   Patient Active Problem List   Diagnosis Date Noted   Hyperglycemia 01/09/2024   Abnormal LFTs (liver function tests) 09/17/2023   Sinus tachycardia 07/07/2023   Aortic atherosclerosis 01/18/2023   Prediabetes 01/18/2023   Hypokalemia 07/29/2022   Weakness of both lower extremities 07/29/2022   Abscess of left groin 11/15/2021   Abnormal LFTs 11/15/2021   Acute respiratory disease due to COVID-19 virus 11/21/2020   Weakness    Hypoalbuminemia due to protein-calorie malnutrition    Neurogenic bowel    Neurogenic bladder    Labile blood pressure    Neuropathic pain    Multiple sclerosis, relapsing-remitting 07/09/2019   Abscess of female pelvis    SVT (supraventricular tachycardia)    Radial styloid tenosynovitis 03/12/2018   Wheelchair dependence 02/27/2018   Localized  osteoporosis with current pathological fracture with routine healing 01/19/2017   Wrist fracture 01/16/2017   Sprain of ankle 03/23/2016   Closed fracture of lateral malleolus 03/16/2016   Health care maintenance 01/24/2016   Blood pressure elevated without history of HTN 10/25/2015   Essential hypertension 10/25/2015   Multiple sclerosis 10/02/2015   Chronic left shoulder pain 07/19/2015   Multiple sclerosis exacerbation 07/14/2015   MS (multiple sclerosis) 11/26/2014   Increased body mass index 11/26/2014   HPV test positive 11/26/2014   Status post laparoscopic supracervical hysterectomy  11/26/2014   Galactorrhea 11/26/2014   Back ache 05/21/2014   Adiposity 05/21/2014   Disordered sleep 05/21/2014   Muscle spasticity 05/21/2014   Spasticity 05/21/2014   Calculus of kidney 12/09/2013   Renal colic 12/09/2013   Hypercholesteremia 08/19/2013   Hereditary and idiopathic neuropathy 08/19/2013   Hypercholesterolemia without hypertriglyceridemia 08/19/2013   Bladder infection, chronic 07/25/2012   Disorder of bladder function 07/25/2012   Incomplete bladder emptying 07/25/2012   Microscopic hematuria 07/25/2012   Right upper quadrant pain 07/25/2012   ONSET DATE: 5/24 Amon and broke R leg which caused beginning of ADL decline; diagnosed with MS in 1995)  REFERRING DIAG: MS  THERAPY DIAG:  Muscle weakness (generalized)  Other lack of coordination  Multiple sclerosis exacerbation  Rationale for Evaluation and Treatment: Rehabilitation  SUBJECTIVE:  SUBJECTIVE STATEMENT: Pt reports feeling good today and having a good PT session prior to OT tx today. Pt accompanied by: self, pt's mother  PERTINENT HISTORY: Pt reports increased difficulty with basic self care tasks since breaking her R leg last May.  Since that time, mobility and ADLs have declined, with pt requiring assist from private caregivers and family to manage bathing, toileting, dressing, and functional transfers.  PRECAUTIONS: Fall  WEIGHT BEARING RESTRICTIONS: No  PAIN: 02/19/24: No pain today  Are you having pain? No; occasional pain in R lower back, but not today  FALLS: Has patient fallen in last 6 months? No Last fall was May 17 of 2024  LIVING ENVIRONMENT: Lives with: lives with their family (including mother who has dementia and sister [name])  Lives in: 2 level home but pt resides on main level Stairs: ramp  Has following equipment at home: Wheelchair (manual), Shower bench, and hand held shower shower, 1 grab bar in the shower, 3in1 commode, sit to stand lift (electric), FWW, hospital  bed  PLOF: modified indep with ADL/IADLs prior to May of 7975   PATIENT GOALS: Increase independence with basic self care tasks  OBJECTIVE:  Note: Objective measures were completed at Evaluation unless otherwise noted.  HAND DOMINANCE: Right  ADLs:  Overall ADLs: assist provided from paid caregiver and sister Transfers/ambulation related to ADLs: sit to stand lift for all transfers, set up for sliding board in/out of bed  Eating: indep  Grooming: modified indep (wc level in mom's bathroom)  UB Dressing: set up (can't access her bedroom closet)  LB Dressing: able to sit on side of bed to don all LB clothing; assist to hike pants/underwear Toileting: assist with clothing management d/t standing within electric lift Bathing: bed bath with sister helping to wash backside Tub Shower transfers: N/A; pt has walk in shower in mom's bathroom (wc does fit in this bathroom but pt reports inability to transfer from wc and requires arm rests on a bench for a successful transfer attempt from any DME).  Tub bench is in pt's bathroom, but lift does not fit through bathroom doorway and pt reports inability  to transfer up from tub bench (currently unable to manage either transfer)   Equipment: see above  IADLs: Shopping: [employer] for shopping Light housekeeping: modified indep from wc level Meal Prep: modified indep from wc level Community mobility: relies on community transportation or family members Medication management: indep Landscape architect: indep Handwriting: NT; pt denies any FMC challenges  MOBILITY STATUS: Hx of falls  POSTURE COMMENTS:  Rounded shoulders, forward head, anterior pelvic tilt, and weight shift left   ACTIVITY TOLERANCE: Eval: Activity tolerance: Per PT; currently standing 30-60 sec within Light Gait harness/lift; currently non-ambulatory  01/24/24: Standing at sink: 1 minute; min A and 5 attempts to achieve fully erect standing position with BUEs supported on  sink countertop, OT assist to block R knee to prevent buckling   UPPER EXTREMITY ROM:  BUEs WFL  UPPER EXTREMITY MMT:     MMT Right eval Left eval  Shoulder flexion 4+ 4  Shoulder abduction 4+ 4  Shoulder adduction    Shoulder extension    Shoulder internal rotation 4+ 4+  Shoulder external rotation 4 4  Middle trapezius    Lower trapezius    Elbow flexion 4+ 4+  Elbow extension 4+ 4+  Wrist flexion 4+ 4+  Wrist extension 4+ 4+  Wrist ulnar deviation    Wrist radial deviation    Wrist pronation    Wrist supination    (Blank rows = not tested)  HAND FUNCTION/COORDINATION:  R/L WNL; pt denies any coordination deficits/ able to manipulate pills/clothing fasteners/etc without difficulty  11/15/23: *Measures taken d/t PT note revealing pt with increased difficulty manipulating smaller ADL supplies during reaching activities in PT session   11/15/23: Grip strength: R: 70 lbs, L: 63 lbs Pinch strength: Lateral: R: 16 lbs, L: 15 lbs; 3 point pinch: R: 12 lbs, L: 13 lbs 9 hole peg test: Right: 25 sec, L 49 sec   12/25/23: Grip strength: Right: 70 lbs; Left: 65 lbs  9 hole peg test: Right: 24 sec; Left: 3 trials: 54 sec, 1 min, 56 sec  01/24/24: Grip grip: Right: 76 lbs; Left: 67 lbs Lateral pinch: R: 17 lbs, L: 17 lbs; 3 point pinch: R: 13 lbs, L: 14 lbs  9 Hole Peg Test: R: 26 sec, L: 50 sec  SENSATION: WFL  EDEMA: No visible edema in BUEs  MUSCLE TONE: BUEs WNL  COGNITION: Overall cognitive status: Within functional limits for tasks assessed  VISION: wears glasses all the time, no reports of diplopia  PERCEPTION: Not tested  PRAXIS: WFL  OBSERVATIONS:  Pt pleasant, cooperative, and motivated to work towards improving indep with ADLs.                                                                                                     TREATMENT DATE: 02/19/24 Self Care -Sit to stand trials x10 in prep for ADL transfers: Pt practiced forward weight shift to achieve  partial sit to stand from wc level, knees blocked with pillow cushioning edge of chair in front of her (chair stabilized by wall); vc for increasing forward WS  and engaging quads to works towards less reliance on BUEs and more on BLEs.  Vc for attempts to hold forward WS while reaching toward arm rest of chair in front of her.  Therapeutic Activity: -Core stability focus with pt completing reps of ipsilateral and contralateral reaching R/L outside BOS to reach for game pieces for a table top game (Connect 4); arm rests removed from wc to increase challenge. -Reps of forward reaching with same task, no UE support when reaching forward outside BOS and returning to midline.  PATIENT EDUCATION: Education details: STS transfer technique Person educated: Patient Education method: Explanation, Demonstration, Tactile cues, and Verbal cues Education comprehension: verbalized understanding and needs further education  HOME EXERCISE PROGRAM: IR A/AAROM to promote shoulder flexibility for peri care/bathing  GOALS: Goals reviewed with patient? Yes  SHORT TERM GOALS: Target date: 12/25/23  Pt will perform bed bath with set up only. Baseline: Eval: Min-mod A for posterior washing; 09/28/23: Min A for posterior washing; 11/13/23: Able to manage bed bath but sister helps with thoroughness, per pt's preference; pt in agreement to trial bath sitting EOB or in wc before next OT session. Goal status: achieved/d/c  2.  Pt will utilize sliding board for wc<>drop arm commode transfer with SBA. Baseline: Eval: Currently using electric lift for transfer to [facility]; 09/28/23: Not yet completed at home but transfers to commode have been easier without sliding board d/t pt implementing a scoot pivot. Goal status: d/c (pt does not prefer sliding board for commode transfer)  3.  Pt will be indep to perform HEP for maintaining BUE strength for ADLs and functional transfers. Baseline: Eval: HEP not yet initiated; 09/28/23: Pt is  working on core stability exercises from wc, cane stretches for bilat shoulder IR, and tricep extension via wc pushups; 11/13/23: indep Goal status: achieved  LONG TERM GOALS: Target date: 02/05/24  Pt will perform sink bath with set up A. (Revised on 11/13/23 from min A to set up) Baseline: Eval: Currently bed bathing d/t inability to stand at sink without electric lift (lift does not fit into pt's bathroom); 09/28/23: still completing bed bath as pt is not yet able to stand; 11/13/23: Not yet attempted; pt encouraged to attempt before next OT session now that bed bath goal has been met; 12/20/23: Pt is now completing sponge bath with min A while seated EOB (caregiver assists with washing peri area d/t no bed rail to aid in R lateral lean); 01/24/24: Performing with set up on EOB, completing lateral weight shifts to wash posterior/peri area with wc in front of pt and bed rail for LUE support and foot of bed for RUE support  Goal status: achieved  2.  Pt will perform wc<>tub bench transfer with min A. Baseline: Eval: Unable; 09/28/23: Performed in OT clinic with min A, but not yet in the home Goal status: d/c (pt prefers sink bath)  3.  Pt will perform squat pivot transfer wc<>BSC with SBA.  Baseline: Eval: Eval: Currently using electric lift for transfer to [facility]; 09/28/23: Not yet completed in clinic with [facility], and using lift for [facility] in the home still; 11/13/23: Still using Camie lift at home and in clinic transfers are all scoot rather than squat pivots d/t LE weakness; 12/20/23: Pt reports that she plans to use her Camie lift until she can ambulate to her [facility] d/t limited space for wc in her desired location for [facility] at home. Goal status: d/c  4. Pt will perform scoot transfer wc<>toilet using  grab bars with supv to allow toileting in community setting.  Baseline: Eval: Pt limits community outings d/t requiring use of lift for [facility] transfers; 09/28/23: Pt has performed 2 successful scoot pivot  Transfers to  toilet in OT clinic with min A, extra time.  Further trials with clothing management on toilet needed, but pt can lower and hike  pants while sititng edge of mat via lateral leaning and min A to maintain core stability; 11/13/23: Pt performs with CGA and extra time; 12/19/23: Pt  performs with close supv on most attempts, occasional CGA; efficiency is improving; 01/24/24: ongoing improvements; able to complete in fewer scoots (3 scoots each way from wc<>toilet) d/t improve engagement of Les and improved anterior weight shift; occasional min guard-supv; pt completes with therapist in clinic, but not yet in other community settings with sister, and not yet voided on toilet in community    Goal status: ongoing  5.  Pt will complete seated clothing management with min A to enable voiding in handicapped stall within community. Baseline: Recert 11/13/23: Pt can transfer to toilet with increased time and effort, but has not yet attempted clothing management or voiding while seated on commode in community setting.  Pt can lower pants to knees while seated in wc, but requires mod A to hike over hips from seated position; 12/20/23: Pt fully lowers panties and shorts while seated on commode with supv, can hike clothing to her her upper thighs, transfers back to wc, and then performs a tricep extension from wc level to allow caregiver to hike clothing remainder of the way for purposes of EC (min A for clothing management component) Goal status: achieved  6.  Pt will tolerate standing x1 min with close supv and BUE support to manage clothing in prep for toileting. Baseline: Eval: Currently standing in electric lift only, or within parallel bars without lift; 09/28/23: Not yet attempted; 11/13/23: Pt is able to perform static standing with heavy BUE support on parallel bars with PT; 12/19/22: Not attempted recently d/t new intermittent clonus in BLEs; 01/24/24: Tolerated standing 1 min today at sink with min A for ascent, OT assist  to block R knee from buckling Goal status: in progress  7.  Pt will increase bilat grip strength in order to securely grasp walker sufficiently for functional transfers and ambulation.  Baseline: Recert 11/13/23: TBD (PT note read following OT session, indicating pt with difficulty securing items in hand);  11/15/23: Grip strength: R: 70 lbs, L: 63 lbs; 12/25/23: R 76 lbs, L 67 lbs; sufficient for functional transfers/amb  Goal status: achieved  8.  Pt will increase [facility] skills in order to manipulate small ADL supplies with reduced dropping as indicated by 20 sec or more improvement in 9 hole peg test. Baseline: Recert 11/13/23: 9 hole peg test TBD (PT note read following OT session, indicating pt with difficulty manipulating and securing items in hands); 11/15/23: Right: 25 sec, L 49 sec; 12/25/23: Right: 24 sec; Left: 3 trials: 54 sec, 1 min, 56 sec; 01/24/24: R: 26 sec, L: 50 sec  Goal status: in progress  ASSESSMENT: CLINICAL IMPRESSION: Pt requires occasional single UE support with extended reaching outside BOS for quick balance checks, but able to perform most reps with controlled return to midline in sitting.  Pt was able to manage partial STS with x10 reps, with 3 of 10 attempts successfully grabbing arm rest of chair positioned in front of her, with a 1-2 second hold in partial standing position (~  50% of erect standing).  Pt continues to verbalize that she feels stronger, and acknowledges improving ability to engage quads during functional transfers rather than lifting self with sole dependence on Ues to lift buttocks from seated surface.  Pt will continue to benefit from skilled OT to work towards above noted goals in OT poc, working to maximize indep with daily tasks while reducing burden of care on caregivers.   PERFORMANCE DEFICITS: in functional skills including ADLs, IADLs, strength, pain, flexibility, Gross motor control, mobility, balance, body mechanics, endurance, and decreased knowledge  of use of DME, and psychosocial skills including coping strategies, environmental adaptation, habits, and routines and behaviors.   IMPAIRMENTS: are limiting patient from ADLs, IADLs, and social participation.   CO-MORBIDITIES: has co-morbidities such as neuropathy, back pain, obesity, MS, HTN that affects occupational performance. Patient will benefit from skilled OT to address above impairments and improve overall function.  MODIFICATION OR ASSISTANCE TO COMPLETE EVALUATION: No modification of tasks or assist necessary to complete an evaluation.  OT OCCUPATIONAL PROFILE AND HISTORY: Detailed assessment: Review of records and additional review of physical, cognitive, psychosocial history related to current functional performance.  CLINICAL DECISION MAKING: Moderate - several treatment options, min-mod task modification necessary  REHAB POTENTIAL: Good  EVALUATION COMPLEXITY: Moderate  PLAN:  OT FREQUENCY: 2x/week  OT DURATION: 12 weeks  PLANNED INTERVENTIONS: 97168 OT Re-evaluation, 97535 self care/ADL training, 02889 therapeutic exercise, 97530 therapeutic activity, 97112 neuromuscular re-education, 97140 manual therapy, 97116 gait training, 02989 moist heat, 97010 cryotherapy, balance training, functional mobility training, psychosocial skills training, energy conservation, coping strategies training, patient/family education, and DME and/or AE instructions  RECOMMENDED OTHER SERVICES: None at this time (Pt currently receiving PT services in this clinic)  CONSULTED AND AGREED WITH PLAN OF CARE: Patient  PLAN FOR NEXT SESSION: see above  Inocente Blazing, MS, OTR/L  Inocente MARLA Blazing, OT 02/19/2024, 11:58 AM

## 2024-02-19 NOTE — Patient Instructions (Signed)
 [name],  Thank you for taking the time for your Medicare Wellness Visit. I appreciate your continued commitment to your health goals. Please review the care plan we discussed, and feel free to reach out if I can assist you further.  Medicare recommends these wellness visits once per year to help you and your care team stay ahead of potential health issues. These visits are designed to focus on prevention, allowing your provider to concentrate on managing your acute and chronic conditions during your regular appointments.  Please note that Annual Wellness Visits do not include a physical exam. Some assessments may be limited, especially if the visit was conducted virtually. If needed, we may recommend a separate in-person follow-up with your provider.  Ongoing Care Seeing your primary care provider every 3 to 6 months helps us  monitor your health and provide consistent, personalized care.   Remember to get your colonoscopy as soon as you are able to. You have an order for:  []   2D Mammogram  [x]   3D Mammogram  [x]   Bone Density     Please call for appointment:  [facility]  [address] [place] [address] [phone]     Make sure to wear two-piece clothing.  No lotions, powders, or deodorants the day of the appointment. Make sure to bring picture ID and insurance card.  Bring list of medications you are currently taking including any supplements.    Referrals If a referral was made during today's visit and you haven't received any updates within two weeks, please contact the referred provider directly to check on the status.  Recommended Screenings:  Health Maintenance  Topic Date Due   Colon Cancer Screening  Never done   COVID-19 Vaccine (4 - 2025-26 season) 01/07/2024   Zoster (Shingles) Vaccine (1 of 2) 04/09/2024*   Flu Shot  08/05/2024*   Pneumococcal Vaccine for age over 50 (1 of 1 - PCV) 01/08/2025*    Breast Cancer Screening  05/15/2024   Medicare Annual Wellness Visit  02/18/2025   DTaP/Tdap/Td vaccine (2 - Td or Tdap) 10/24/2025   Hepatitis C Screening  Completed   HIV Screening  Completed   Hepatitis B Vaccine  Aged Out   HPV Vaccine  Aged Out   Meningitis B Vaccine  Aged Out  *Topic was postponed. The date shown is not the original due date.       02/19/2024    2:37 PM  Advanced Directives  Does Patient Have a Medical Advance Directive? Yes  Type of Estate agent of [place];Living will  Does patient want to make changes to medical advance directive? No - Patient declined  Copy of Healthcare Power of Attorney in Chart? Yes - validated most recent copy scanned in chart (See row information)   Advance Care Planning is important because it: Ensures you receive medical care that aligns with your values, goals, and preferences. Provides guidance to your family and loved ones, reducing the emotional burden of decision-making during critical moments.  Vision: Annual vision screenings are recommended for early detection of glaucoma, cataracts, and diabetic retinopathy. These exams can also reveal signs of chronic conditions such as diabetes and high blood pressure.  Dental: Annual dental screenings help detect early signs of oral cancer, gum disease, and other conditions linked to overall health, including heart disease and diabetes.  Please see the attached documents for additional preventive care recommendations.

## 2024-02-19 NOTE — Progress Notes (Signed)
 Subjective:   Lisa Williams is a 63 y.o. who presents for a Medicare Wellness preventive visit.  As a reminder, Annual Wellness Visits don't include a physical exam, and some assessments may be limited, especially if this visit is performed virtually. We may recommend an in-person follow-up visit with your provider if needed.  Visit Complete: Virtual I connected with  Lisa Williams on 02/19/24 by a video and audio enabled telemedicine application and verified that I am speaking with the correct person using two identifiers.  Patient Location: Home  Provider Location: Home Office  I discussed the limitations of evaluation and management by telemedicine. The patient expressed understanding and agreed to proceed.  Vital Signs: Because this visit was a virtual/telehealth visit, some criteria may be missing or patient reported. Any vitals not documented were not able to be obtained and vitals that have been documented are patient reported.    Persons Participating in Visit: Patient.  AWV Questionnaire: No: Patient Medicare AWV questionnaire was not completed prior to this visit.  Cardiac Risk Factors include: dyslipidemia;hypertension;sedentary lifestyle;obesity (BMI >30kg/m2)     Objective:    Today's Vitals   02/19/24 1426  Weight: 244 lb (110.7 kg)  Height: 5' 11 (1.803 m)   Body mass index is 34.03 kg/m.  Advanced directive in chart: unable to pull all information over    Current Medications (verified) Outpatient Encounter Medications as of 02/19/2024  Medication Sig   acetaminophen  (TYLENOL ) 500 MG tablet Take 500-1,000 mg by mouth every 6 (six) hours as needed for moderate pain or headache.   baclofen  (LIORESAL ) 20 MG tablet Take 1 tablet (20 mg total) by mouth 4 (four) times daily.   Cholecalciferol  25 MCG (1000 UT) tablet Take 5,000 Units by mouth daily.   levothyroxine  (SYNTHROID ) 50 MCG tablet Take 1 tablet (50 mcg total) by mouth  daily before breakfast.   MAVENCLAD, 10 TABS, 10 MG TBPK Take by mouth.   meloxicam  (MOBIC ) 15 MG tablet Take 1 tablet (15 mg total) by mouth daily.   oxybutynin  (DITROPAN -XL) 10 MG 24 hr tablet Take 1 tablet (10 mg total) by mouth daily.   rosuvastatin (CRESTOR) 10 MG tablet Take 10 mg by mouth at bedtime.   telmisartan  (MICARDIS ) 40 MG tablet Take 1 tablet (40 mg total) by mouth daily.   tiZANidine  (ZANAFLEX ) 4 MG tablet TAKE ONE BY MOUTH EVERY MORNING ONE BY MOUTH EVERY EVENING AND TWO BY MOUTH AT BEDTIME   No facility-administered encounter medications on file as of 02/19/2024.    Allergies (verified) Amlodipine , Betadine [povidone-iodine], and Povidone iodine   History: Past Medical History:  Diagnosis Date   Abdominal pain, right upper quadrant    Back pain    Calculus of kidney 12/09/2013   Chronic back pain    unspecified   Chronic left shoulder pain 07/19/2015   Complication of anesthesia    Functional disorder of bladder    other   Galactorrhea 11/26/2014   Chronic    Hereditary and idiopathic neuropathy 08/19/2013   History of kidney stones    HPV test positive    Hypercholesteremia 08/19/2013   Hypertension    Incomplete bladder emptying    Microscopic hematuria    MS (multiple sclerosis)    Muscle spasticity 05/21/2014   Nonspecific findings on examination of urine    other   Osteopenia    PONV (postoperative nausea and vomiting)    Status post laparoscopic supracervical hysterectomy 11/26/2014  Tobacco user 11/26/2014   Wrist fracture    Past Surgical History:  Procedure Laterality Date   bilateral tubal ligation  1996   BREAST CYST EXCISION Left 2002   CYST EXCISION Left 05/10/2022   Procedure: CYST REMOVAL;  Surgeon: Rodolph Romano, MD;  Location: [facility];  Service: General;  Laterality: Left;   FRACTURE SURGERY     KNEE SURGERY     right   LAPAROSCOPIC SUPRACERVICAL HYSTERECTOMY  08/05/2013   ORIF WRIST FRACTURE Left 01/17/2017    Procedure: OPEN REDUCTION INTERNAL FIXATION (ORIF) WRIST FRACTURE;  Surgeon: Leora Lynwood SAUNDERS, MD;  Location: [facility];  Service: Orthopedics;  Laterality: Left;   RADIOLOGY WITH ANESTHESIA N/A 03/18/2020   Procedure: MRI WITH ANESTHESIA CERVICAL SPINE AND BRAIN  WITH AND WITHOUT CONTRAST;  Surgeon: Radiologist, Medication, MD;  Location: [facility];  Service: Radiology;  Laterality: N/A;   TUBAL LIGATION Bilateral    VAGINAL HYSTERECTOMY  03/2006   Family History  Problem Relation Age of Onset   Osteoarthritis Mother    Hypertension Mother    Hyperlipidemia Mother    Nephrolithiasis Father    Hypertension Father    Prostate cancer Father    Kidney disease Father    Heart failure Father    Sickle cell trait Sister    Hypertension Sister    Supraventricular tachycardia Sister    Hypertension Sister    Hypertension Sister    Diabetes Maternal Aunt    Breast cancer Maternal Aunt        great MAT   Diabetes Maternal Grandmother    Liver cancer Maternal Grandmother    Ovarian cancer Paternal Grandmother    Multiple sclerosis Cousin    GU problems Neg Hx    Urolithiasis Neg Hx    Social History   Socioeconomic History   Marital status: Single    Spouse name: Not on file   Number of children: 0   Years of education: 14   Highest education level: Not on file  Occupational History   Not on file  Tobacco Use   Smoking status: Former    Current packs/day: 0.50    Types: Cigarettes   Smokeless tobacco: Never  Vaping Use   Vaping status: Never Used  Substance and Sexual Activity   Alcohol use: No    Alcohol/week: 0.0 standard drinks of alcohol   Drug use: No   Sexual activity: Not Currently    Birth control/protection: Surgical  Other Topics Concern   Not on file  Social History Narrative   Lives at home with mom and sister,. Has a walker for ambulation/uses WC when necessary   Right Handed   Drinks no caffeine   Pt not working    Social Drivers of Research scientist (physical sciences) Strain: Low Risk  (02/19/2024)   Overall Financial Resource Strain (CARDIA)    Difficulty of Paying Living Expenses: Not hard at all  Food Insecurity: No Food Insecurity (02/19/2024)   Hunger Vital Sign    Worried About Running Out of Food in the Last Year: Never true    Ran Out of Food in the Last Year: Never true  Transportation Needs: No Transportation Needs (02/19/2024)   PRAPARE - Administrator, Civil Service (Medical): No    Lack of Transportation (Non-Medical): No  Physical Activity: Inactive (02/19/2024)   Exercise Vital Sign    Days of Exercise per Week: 0 days    Minutes of Exercise per Session: 0 min  Stress: No Stress Concern Present (02/19/2024)   [employer] of Occupational Health - Occupational Stress Questionnaire    Feeling of Stress: Not at all  Social Connections: Socially Isolated (02/19/2024)   Social Connection and Isolation Panel    Frequency of Communication with Friends and Family: More than three times a week    Frequency of Social Gatherings with Friends and Family: More than three times a week    Attends Religious Services: Never    Database administrator or Organizations: No    Attends Engineer, structural: Never    Marital Status: Divorced    Tobacco Counseling Counseling given: Not Answered    Clinical Intake:  Pre-visit preparation completed: Yes  Pain : No/denies pain     BMI - recorded: 34.03 Nutritional Status: BMI > 30  Obese Nutritional Risks: None Diabetes: No  Lab Results  Component Value Date   HGBA1C 6.1 01/09/2024   HGBA1C 5.1 11/29/2016   HGBA1C 5.2 11/27/2014     How often do you need to have someone help you when you read instructions, pamphlets, or other written materials from your doctor or pharmacy?: 1 - Never  Interpreter Needed?: No  Information entered by :: R. Conda Wannamaker LPN   Activities of Daily Living     02/19/2024    2:28 PM 07/08/2023    8:54 PM  In your present state  of health, do you have any difficulty performing the following activities:  Hearing? 0 0  Vision? 0 0  Difficulty concentrating or making decisions? 0 0  Walking or climbing stairs? 1   Dressing or bathing? 0   Doing errands, shopping? 0 0  Preparing Food and eating ? N   Using the Toilet? N   In the past six months, have you accidently leaked urine? N   Do you have problems with loss of bowel control? N   Managing your Medications? N   Managing your Finances? N   Housekeeping or managing your Housekeeping? N     Patient Care Team: Glendia Shad, MD as PCP - General (Internal Medicine) Lane Arthea BRAVO, MD as Referring Physician (Neurology) Vear, Charlie LABOR, MD (Neurology) Zelphia Nest, PA-C as Physician Assistant (Physician Assistant)  I have updated your Care Teams any recent Medical Services you may have received from other providers in the past year.     Assessment:   This is a routine wellness examination for [place].  Hearing/Vision screen Hearing Screening - Comments:: No issues Vision Screening - Comments:: glasses   Goals Addressed             This Visit's Progress    Patient Stated       Wants to continue PT so that she can start back walking and driving like she was        Depression Screen     02/19/2024    2:32 PM 01/09/2024   10:34 AM 03/19/2017   10:36 AM  PHQ 2/9 Scores  PHQ - 2 Score 0 0 0  PHQ- 9 Score 0      Fall Risk     02/19/2024    2:30 PM 01/09/2024   10:35 AM 03/19/2017   10:36 AM  Fall Risk   Falls in the past year? 0 0 Yes   Number falls in past yr: 0 0 1   Injury with Fall? 0 0 Yes   Risk for fall due to : No Fall Risks No Fall Risks Impaired mobility  Follow up Falls evaluation completed;Falls prevention discussed Falls evaluation completed Falls prevention discussed      Data saved with a previous flowsheet row definition    MEDICARE RISK AT HOME:  Medicare Risk at Home Any stairs in or around the home?:  Yes If so, are there any without handrails?: No Home free of loose throw rugs in walkways, pet beds, electrical cords, etc?: Yes Adequate lighting in your home to reduce risk of falls?: Yes Life alert?: No Use of a cane, walker or w/c?: Yes Grab bars in the bathroom?: No Shower chair or bench in shower?: Yes Elevated toilet seat or a handicapped toilet?: Yes  TIMED UP AND GO:  Was the test performed?  No  Cognitive Function: 6CIT completed        02/19/2024    2:37 PM  6CIT Screen  What Year? 0 points  What month? 0 points  What time? 0 points  Count back from 20 0 points  Months in reverse 0 points  Repeat phrase 0 points  Total Score 0 points    Immunizations Immunization History  Administered Date(s) Administered   Moderna Sars-Covid-2 Vaccination 07/05/2019, 08/02/2019   PFIZER Comirnaty(Gray Top)Covid-19 Tri-Sucrose Vaccine 02/20/2022   PPD Test 07/14/2016, 07/18/2016   Tdap 10/25/2015    Screening Tests Health Maintenance  Topic Date Due   Colonoscopy  Never done   COVID-19 Vaccine (4 - 2025-26 season) 01/07/2024   Zoster Vaccines- Shingrix (1 of 2) 04/09/2024 (Originally 10/18/1979)   Influenza Vaccine  08/05/2024 (Originally 12/07/2023)   Pneumococcal Vaccine: 50+ Years (1 of 1 - PCV) 01/08/2025 (Originally 10/19/2011)   Mammogram  05/15/2024   Medicare Annual Wellness (AWV)  02/18/2025   DTaP/Tdap/Td (2 - Td or Tdap) 10/24/2025   Hepatitis C Screening  Completed   HIV Screening  Completed   Hepatitis B Vaccines 19-59 Average Risk  Aged Out   HPV VACCINES  Aged Out   Meningococcal B Vaccine  Aged Out    Health Maintenance Items Addressed: Mammogram ordered, DEXA ordered, Patient declines vaccines. Patient stated that she plans on having a colonoscopy in the future when she can walk again.  Additional Screening:  Vision Screening: Recommended annual ophthalmology exams for early detection of glaucoma and other disorders of the eye. Is the patient up  to date with their annual eye exam?  Yes  Who is the provider or what is the name of the office in which the patient attends annual eye exams? [facility]  Dental Screening: Recommended annual dental exams for proper oral hygiene  Community Resource Referral / Chronic Care Management: CRR required this visit?  No   CCM required this visit?  No   Plan:    I have personally reviewed and noted the following in the patient's chart:   Medical and social history Use of alcohol, tobacco or illicit drugs  Current medications and supplements including opioid prescriptions. Patient is not currently taking opioid prescriptions. Functional ability and status Nutritional status Physical activity Advanced directives List of other physicians Hospitalizations, surgeries, and ER visits in previous 12 months Vitals Screenings to include cognitive, depression, and falls Referrals and appointments  In addition, I have reviewed and discussed with patient certain preventive protocols, quality metrics, and best practice recommendations. A written personalized care plan for preventive services as well as general preventive health recommendations were provided to patient.   Angeline Fredericks, LPN   [id number]   After Visit Summary: (MyChart) Due to this being a telephonic visit, the  after visit summary with patients personalized plan was offered to patient via MyChart   Notes: Nothing significant to report at this time.   Patient stated that she has never done an AWV before.

## 2024-02-19 NOTE — Therapy (Addendum)
 OUTPATIENT PHYSICAL THERAPY NEURO TREATMENT  Patient Name: Lisa Williams MRN: [id number] DOB:April 20, 1961, 63 y.o., female Today's Date: 02/19/2024  PCP: Lenon Na  REFERRING PROVIDER: Lenon Na   END OF SESSION:  PT End of Session - 02/19/24 1306     Visit Number 61    Number of Visits 70    Date for Recertification  03/20/24   corrected   Progress Note Due on Visit 50    PT Start Time 1017    PT Stop Time 1100    PT Time Calculation (min) 43 min    Equipment Utilized During Treatment Gait belt    Activity Tolerance Patient tolerated treatment well;No increased pain    Behavior During Therapy [facility] for tasks assessed/performed         Past Medical History:  Diagnosis Date   Abdominal pain, right upper quadrant    Back pain    Calculus of kidney 12/09/2013   Chronic back pain    unspecified   Chronic left shoulder pain 07/19/2015   Complication of anesthesia    Functional disorder of bladder    other   Galactorrhea 11/26/2014   Chronic    Hereditary and idiopathic neuropathy 08/19/2013   History of kidney stones    HPV test positive    Hypercholesteremia 08/19/2013   Hypertension    Incomplete bladder emptying    Microscopic hematuria    MS (multiple sclerosis)    Muscle spasticity 05/21/2014   Nonspecific findings on examination of urine    other   Osteopenia    PONV (postoperative nausea and vomiting)    Status post laparoscopic supracervical hysterectomy 11/26/2014   Tobacco user 11/26/2014   Wrist fracture    Past Surgical History:  Procedure Laterality Date   bilateral tubal ligation  1996   BREAST CYST EXCISION Left 2002   CYST EXCISION Left 05/10/2022   Procedure: CYST REMOVAL;  Surgeon: Rodolph Romano, MD;  Location: [facility] ORS;  Service: General;  Laterality: Left;   FRACTURE SURGERY     KNEE SURGERY     right   LAPAROSCOPIC SUPRACERVICAL HYSTERECTOMY  08/05/2013   ORIF WRIST FRACTURE Left 01/17/2017    Procedure: OPEN REDUCTION INTERNAL FIXATION (ORIF) WRIST FRACTURE;  Surgeon: Leora Lynwood SAUNDERS, MD;  Location: [facility] ORS;  Service: Orthopedics;  Laterality: Left;   RADIOLOGY WITH ANESTHESIA N/A 03/18/2020   Procedure: MRI WITH ANESTHESIA CERVICAL SPINE AND BRAIN  WITH AND WITHOUT CONTRAST;  Surgeon: Radiologist, Medication, MD;  Location: [facility] OR;  Service: Radiology;  Laterality: N/A;   TUBAL LIGATION Bilateral    VAGINAL HYSTERECTOMY  03/2006   Patient Active Problem List   Diagnosis Date Noted   Hyperglycemia 01/09/2024   Abnormal LFTs (liver function tests) 09/17/2023   Sinus tachycardia 07/07/2023   Aortic atherosclerosis 01/18/2023   Prediabetes 01/18/2023   Hypokalemia 07/29/2022   Weakness of both lower extremities 07/29/2022   Abscess of left groin 11/15/2021   Abnormal LFTs 11/15/2021   Acute respiratory disease due to COVID-19 virus 11/21/2020   Weakness    Hypoalbuminemia due to protein-calorie malnutrition    Neurogenic bowel    Neurogenic bladder    Labile blood pressure    Neuropathic pain    Multiple sclerosis, relapsing-remitting 07/09/2019   Abscess of female pelvis    SVT (supraventricular tachycardia)    Radial styloid tenosynovitis 03/12/2018   Wheelchair dependence 02/27/2018   Localized osteoporosis with current pathological fracture with routine healing 01/19/2017   Wrist fracture 01/16/2017  Sprain of ankle 03/23/2016   Closed fracture of lateral malleolus 03/16/2016   Health care maintenance 01/24/2016   Blood pressure elevated without history of HTN 10/25/2015   Essential hypertension 10/25/2015   Multiple sclerosis 10/02/2015   Chronic left shoulder pain 07/19/2015   Multiple sclerosis exacerbation 07/14/2015   MS (multiple sclerosis) 11/26/2014   Increased body mass index 11/26/2014   HPV test positive 11/26/2014   Status post laparoscopic supracervical hysterectomy 11/26/2014   Galactorrhea 11/26/2014   Back ache 05/21/2014   Adiposity  05/21/2014   Disordered sleep 05/21/2014   Muscle spasticity 05/21/2014   Spasticity 05/21/2014   Calculus of kidney 12/09/2013   Renal colic 12/09/2013   Hypercholesteremia 08/19/2013   Hereditary and idiopathic neuropathy 08/19/2013   Hypercholesterolemia without hypertriglyceridemia 08/19/2013   Bladder infection, chronic 07/25/2012   Disorder of bladder function 07/25/2012   Incomplete bladder emptying 07/25/2012   Microscopic hematuria 07/25/2012   Right upper quadrant pain 07/25/2012    ONSET DATE: 1995  REFERRING DIAG: MS  THERAPY DIAG:  Muscle weakness (generalized)  Other lack of coordination  Multiple sclerosis exacerbation  Difficulty in walking, not elsewhere classified  Unsteadiness on feet  Abnormality of gait and mobility  Other abnormalities of gait and mobility  Rationale for Evaluation and Treatment: Rehabilitation  SUBJECTIVE:                                                                                                                                                                                             SUBJECTIVE STATEMENT:  Pt reports she is feeling pretty good and ready to begin therapy.  No updates on the standing frame at this point in time.  Pt accompanied by: self  PERTINENT HISTORY:  Patient is returning to PT s/p hospitalization.  s/p ORIF of R tibia shaft fracture 09/23/2022. Patient has weakness in BLE with RLE>LLE. She drives with hand controls. Patient has been diagnosed with MS in 1995. PMH includes: back pain, CBP, chronic L shoulder pain, galactorrhea, neuropathy, HPV, hypercholestermeia, HTN, MS, osteopenia, PONV, wrist fracture. Additional order for other closed fracture of proximal end of R tibia with routine healing. Still has to use [employer].   PAIN:  Are you having pain? Occasional pain in RLE; primarily in knee  PRECAUTIONS: Fall  RED FLAGS: None   WEIGHT BEARING RESTRICTIONS: No  FALLS: Has patient fallen in  last 6 months? No  LIVING ENVIRONMENT: Lives with: lives with their family Lives in: House/apartment Stairs: ramp Has following equipment at home: Vannie - 2 wheeled, Wheelchair (manual), shower chair, and Grab bars  PLOF: Independent with household mobility with  device  PATIENT GOALS: to get her independence back. To be able to get into car, toilet, and get dressed independently  OBJECTIVE:  Note: Objective measures were completed at Evaluation unless otherwise noted.  DIAGNOSTIC FINDINGS: MRI of the brain 03/18/2020 showed multiple T2/FLAIR hyperintense foci in the periventricular, juxtacortical and deep white matter.  There were no infratentorial lesions noted.  None of the foci enhanced.   Due to severe claustrophobia, the study was done with conscious sedation in the hospital   COGNITION: Overall cognitive status: Within functional limits for tasks assessed   SENSATION: Lack of sensation in feet, loss in bilateral lateral aspect of knee  COORDINATION: Does not have the strength for functional LE heel slide test   MUSCLE TONE: BLE mild tone    POSTURE: rounded shoulders, forward head, anterior pelvic tilt, and weight shift left   LOWER EXTREMITY MMT:    MMT Right Eval Left Eval  Hip flexion 0.9 1.5  Hip extension    Hip abduction 1.8 2.6  Hip adduction 3.1 1.9  Hip internal rotation    Hip external rotation    Knee flexion 0.9 1.5  Knee extension 0.8 1.2  Ankle dorsiflexion    Ankle plantarflexion    Ankle inversion    Ankle eversion    (Blank rows = not tested)  BED MOBILITY:  Assess in future session due to limited time  TRANSFERS: Assistive device utilized: Bariatric RW with sit to stands, slide board to table  Sit to stand: unable to reach full stand Stand to sit: unable to reach full stand  Chair to chair: slide board with CGA   GAIT: Unable to ambulate at this time.   FUNCTIONAL TESTS:  Sit to stand: tricep press with BUE; unable to bring arm  to walker.    Function In Sitting Test (FIST)  (1/2 femur on surface; hips/knees flexed to 90deg)   - indicate bed or mat table / step stool if used  SCORING KEY: 4 = Independent (completes task independently & successfully) 3 = Verbal Cues/Increased Time (completes task independently & successfully and only needs more time/cues) 2 = Upper Extremity Support (must use UE for support or assistance to complete successfully) 1 = Needs Assistance (unable to complete w/o physical assist; DOCUMENT LEVEL: min, mod, max) 0 = Dependent (requires complete physical assist; unable to complete successfully even w/ physical assist)  Randomly Administer Once Throughout Exam  4 - Anterior Nudge (superior sternum)  4 - Posterior Nudge (between scapular spines)  4 - Lateral Nudge (to dominant side at acromion)     4 - Static sitting (30 seconds)  4 - Sitting, shake 'no' (left and right)  4 - Sitting, eyes closed (30 seconds)   0 - Sitting, lift foot (dominant side, lift foot 1 inch twice)    2 - Pick up object from behind (object at midline, hands breadth posterior)  3 - Forward reach (use dominant arm, must complete full motion) 2 - Lateral reach (use dominant arm, clear opposite ischial tuberosity) 2 - Pick up object from floor (from between feet)   2 - Posterior scooting (move backwards 2 inches)  2 - Anterior scooting (move forward 2 inches)  2 - Lateral scooting (move to dominant side 2 inches)    TOTAL = 39/56  Notes/comments: slide board tranfer to/from table   MCD > 5 points MCID for IP REHAB > 6 points  Function In Sitting Test (FIST) 08/14/23 (1/2 femur on surface; hips/knees flexed to 90deg)   -  indicate bed or mat table / step stool if used  SCORING KEY: 4 = Independent (completes task independently & successfully) 3 = Verbal Cues/Increased Time (completes task independently & successfully and only needs more time/cues) 2 = Upper Extremity Support (must use UE for support or  assistance to complete successfully) 1 = Needs Assistance (unable to complete w/o physical assist; DOCUMENT LEVEL: min, mod, max) 0 = Dependent (requires complete physical assist; unable to complete successfully even w/ physical assist)  Randomly Administer Once Throughout Exam  4 - Anterior Nudge (superior sternum)  4 - Posterior Nudge (between scapular spines)  4 - Lateral Nudge (to dominant side at acromion)     4 - Static sitting (30 seconds)  4 - Sitting, shake 'no' (left and right)  4 - Sitting, eyes closed (30 seconds)   0 - Sitting, lift foot (dominant side, lift foot 1 inch twice)    4 - Pick up object from behind (object at midline, hands breadth posterior)  4 - Forward reach (use dominant arm, must complete full motion) 4 - Lateral reach (use dominant arm, clear opposite ischial tuberosity)  - Pick up object from floor (from between feet)   3 - Posterior scooting (move backwards 2 inches)  3 - Anterior scooting (move forward 2 inches)  3 - Lateral scooting (move to dominant side 2 inches)    TOTAL = 47/56  MCD > 5 points MCID for IP REHAB > 6 points   TREATMENT DATE: 02/19/24  TherAct: To improve functional movements patterns for everyday tasks  Standing frame (using the Strap stand): Patient able to push up from chair to minimally raise her bottom to allow author to position gluteal padding underneath her.    Dependent with foot straps and locking of frame.   Static stance with pt playing a full game of [place] in upright position  Static stance with pt playing blackjacks for 14 total hands, intermittent UE use for core activation  Dynamic reaching while in standing position to improve core stability and activation, multiple reps performed     PATIENT EDUCATION: Education details: Pt educated throughout session about proper posture and technique with exercises. Improved exercise technique, movement at target joints, use of target muscles after min to mod  verbal, visual, tactile cues.  Person educated: Patient Education method: Explanation, Demonstration, Tactile cues, and Verbal cues Education comprehension: verbalized understanding, returned demonstration, verbal cues required, tactile cues required, and needs further education  HOME EXERCISE PROGRAM: Stand 3x/day in stander   GOALS: Goals reviewed with patient? Yes  SHORT TERM GOALS: Target date: 08/08/2023  Patient will be independent in home exercise program to improve strength/mobility for better functional independence with ADLs.  Baseline: 4/8: compliance Goal status: MET    LONG TERM GOALS: Target date: 03/20/2024  Patient will tolerate five consecutive stands with UE support from wheelchair to standing to improve functional mobility with additional cushion and RW.  Baseline: unable to perform 4/8:unable to perform full stand 5/20: perform next session 5/29: two full stands with CGA and cushion on her seat 7/31: able to stand 3 times from [facility]. With no addition cushion  in parallel bars. 12/27/2023= Will retest next session- concentrated on just standing on past 2 visits Goal status: Ongoing   2.  Patient will ambulate 10 ft with bRW with wheelchair follow.  Baseline:  unable to ambulate 4/8: unable to ambulate 5/20: able to ambulate length of // bars in previous session 5/27: ambulate 2.5 lengths of // bars  with two seated rest breaks with min A for weight shift and wheelchair follow  7/31: able to ambulate in parallel bars 3 ft with min assist and +2 follow in WC> difficulty advancing the LLE on this day. 12/27/2023- Patient unable to take a step forward today but was abel to take a step on previous visit- limited by clonus like trembling that has progressed over past month. Goal status: Ongoing   3.  Patient will improve FIST score >6 points to demonstrate improved stability and ability to perform ADLs.  Baseline:  3/6: 39/56 4/8: 47/ 56 5/20: 48/56 7/31:46/56 Goal status:  MET  4.  Patient will perform toileting with mod I at home for improved independence.  Baseline: 3/6: requires assistance 4/8: requires dependence on machine 5/20: requires assistance with use of wipe buddy.  7/31: continues to require use of sara steady and wipe buddy for safety and hygiene. 12/27/2023- Patient reports that since she is unable to take steps that she continues to require use of sara steady and wipe buddy for safe personal hygiene. Goal status: ongoing   5. Patient will perform > 5 min of static or dynamic standing with BUE and Min/mod assist  for improved LE strength and pre-gait function to assist with toileting and overall standing ability for improve functional independence.  Baseline: 12/27/2023-Patient was able to stand for just over 3 min today. 01/10/24: Pt able to stand 1 min 38 sec 02/14/24: Pt able to >3 minutes at this time. Goal status: PROGRESSING    ASSESSMENT:  CLINICAL IMPRESSION:  Pt responded well to the upright standing frame and was able to engaged the core more efficiently and effectively than in prior sessions.  Pt also introduced to the idea of utilizing a walker with forearm support to enable the pt to engage the latissimus dorsi along with triceps instead of just the triceps.  Pt agreeable to attempting this in future session if therapist can find a forearm support to attach to the walker.   Pt will continue to benefit from skilled therapy to address remaining deficits in order to improve overall QoL and return to PLOF.        OBJECTIVE IMPAIRMENTS: Abnormal gait, cardiopulmonary status limiting activity, decreased activity tolerance, decreased balance, decreased coordination, decreased endurance, decreased mobility, difficulty walking, decreased ROM, decreased strength, hypomobility, increased fascial restrictions, impaired perceived functional ability, impaired flexibility, impaired sensation, improper body mechanics, and postural dysfunction.    ACTIVITY LIMITATIONS: carrying, lifting, bending, sitting, standing, squatting, sleeping, stairs, transfers, bed mobility, continence, bathing, toileting, dressing, self feeding, reach over head, hygiene/grooming, locomotion level, and caring for others  PARTICIPATION LIMITATIONS: meal prep, cleaning, laundry, interpersonal relationship, driving, shopping, community activity, and church  PERSONAL FACTORS: Age, Past/current experiences, Time since onset of injury/illness/exacerbation, Transportation, and 3+ comorbidities: ack pain, CBP, chronic L shoulder pain, galactorrhea, neuropathy, HPV, hypercholestermeia, HTN, MS, osteopenia, PONV, wrist fracture are also affecting patient's functional outcome.   REHAB POTENTIAL: Good  CLINICAL DECISION MAKING: Evolving/moderate complexity  EVALUATION COMPLEXITY: Moderate  PLAN:  PT FREQUENCY: 2x/week  PT DURATION: 12 weeks  PLANNED INTERVENTIONS: 97164- PT Re-evaluation, 97110-Therapeutic exercises, 97530- Therapeutic activity, 97112- Neuromuscular re-education, 97535- Self Care, [id number]- Manual therapy, [phone]- Gait training, [phone]- Orthotic Fit/training, [phone]- Canalith repositioning, [id number]- Aquatic Therapy, [phone]- Electrical stimulation (unattended), [phone]- Electrical stimulation (manual), [id number]- Ultrasound, [id number]- Traction (mechanical), Patient/Family education, Balance training, Stair training, Taping, Dry Needling, Joint mobilization, Joint manipulation, Spinal manipulation, Spinal mobilization, Scar mobilization, Compression bandaging, Vestibular training, Visual/preceptual remediation/compensation, Cognitive remediation, DME  instructions, Cryotherapy, Moist heat, and Biofeedback  PLAN FOR NEXT SESSION:  Strap stand frame with more dynamic activities Sit to stands; try supported walking, continue dynamic core stabilization training and seated reaching tasks to improve weight shifting.    Fonda Simpers, PT, DPT Physical Therapist - [facility]  02/19/24, 1:12 PM

## 2024-02-21 ENCOUNTER — Ambulatory Visit

## 2024-02-21 ENCOUNTER — Ambulatory Visit: Payer: PPO

## 2024-02-21 DIAGNOSIS — M6281 Muscle weakness (generalized): Secondary | ICD-10-CM

## 2024-02-21 DIAGNOSIS — R278 Other lack of coordination: Secondary | ICD-10-CM

## 2024-02-21 DIAGNOSIS — G35D Multiple sclerosis, unspecified: Secondary | ICD-10-CM

## 2024-02-21 DIAGNOSIS — R269 Unspecified abnormalities of gait and mobility: Secondary | ICD-10-CM

## 2024-02-21 DIAGNOSIS — R2681 Unsteadiness on feet: Secondary | ICD-10-CM

## 2024-02-21 DIAGNOSIS — R2689 Other abnormalities of gait and mobility: Secondary | ICD-10-CM

## 2024-02-21 DIAGNOSIS — R262 Difficulty in walking, not elsewhere classified: Secondary | ICD-10-CM

## 2024-02-21 NOTE — Therapy (Signed)
 OUTPATIENT PHYSICAL THERAPY NEURO TREATMENT   Patient Name: Lisa Williams MRN: [id number] DOB:14-Feb-1961, 63 y.o., female Today's Date: 02/21/2024  PCP: Lenon Na  REFERRING PROVIDER: Lenon Na   END OF SESSION:  PT End of Session - 02/21/24 1104     Visit Number 62    Number of Visits 70    Date for Recertification  03/20/24   corrected   Progress Note Due on Visit 50    PT Start Time 1018    PT Stop Time 1100    PT Time Calculation (min) 42 min    Equipment Utilized During Treatment Gait belt    Activity Tolerance Patient tolerated treatment well;No increased pain    Behavior During Therapy [facility] for tasks assessed/performed           Past Medical History:  Diagnosis Date   Abdominal pain, right upper quadrant    Back pain    Calculus of kidney 12/09/2013   Chronic back pain    unspecified   Chronic left shoulder pain 07/19/2015   Complication of anesthesia    Functional disorder of bladder    other   Galactorrhea 11/26/2014   Chronic    Hereditary and idiopathic neuropathy 08/19/2013   History of kidney stones    HPV test positive    Hypercholesteremia 08/19/2013   Hypertension    Incomplete bladder emptying    Microscopic hematuria    MS (multiple sclerosis)    Muscle spasticity 05/21/2014   Nonspecific findings on examination of urine    other   Osteopenia    PONV (postoperative nausea and vomiting)    Status post laparoscopic supracervical hysterectomy 11/26/2014   Tobacco user 11/26/2014   Wrist fracture    Past Surgical History:  Procedure Laterality Date   bilateral tubal ligation  1996   BREAST CYST EXCISION Left 2002   CYST EXCISION Left 05/10/2022   Procedure: CYST REMOVAL;  Surgeon: Rodolph Romano, MD;  Location: [facility] ORS;  Service: General;  Laterality: Left;   FRACTURE SURGERY     KNEE SURGERY     right   LAPAROSCOPIC SUPRACERVICAL HYSTERECTOMY  08/05/2013   ORIF WRIST FRACTURE Left 01/17/2017    Procedure: OPEN REDUCTION INTERNAL FIXATION (ORIF) WRIST FRACTURE;  Surgeon: Leora Lynwood SAUNDERS, MD;  Location: [facility] ORS;  Service: Orthopedics;  Laterality: Left;   RADIOLOGY WITH ANESTHESIA N/A 03/18/2020   Procedure: MRI WITH ANESTHESIA CERVICAL SPINE AND BRAIN  WITH AND WITHOUT CONTRAST;  Surgeon: Radiologist, Medication, MD;  Location: [facility] OR;  Service: Radiology;  Laterality: N/A;   TUBAL LIGATION Bilateral    VAGINAL HYSTERECTOMY  03/2006   Patient Active Problem List   Diagnosis Date Noted   Hyperglycemia 01/09/2024   Abnormal LFTs (liver function tests) 09/17/2023   Sinus tachycardia 07/07/2023   Aortic atherosclerosis 01/18/2023   Prediabetes 01/18/2023   Hypokalemia 07/29/2022   Weakness of both lower extremities 07/29/2022   Abscess of left groin 11/15/2021   Abnormal LFTs 11/15/2021   Acute respiratory disease due to COVID-19 virus 11/21/2020   Weakness    Hypoalbuminemia due to protein-calorie malnutrition    Neurogenic bowel    Neurogenic bladder    Labile blood pressure    Neuropathic pain    Multiple sclerosis, relapsing-remitting 07/09/2019   Abscess of female pelvis    SVT (supraventricular tachycardia)    Radial styloid tenosynovitis 03/12/2018   Wheelchair dependence 02/27/2018   Localized osteoporosis with current pathological fracture with routine healing 01/19/2017   Wrist fracture  01/16/2017   Sprain of ankle 03/23/2016   Closed fracture of lateral malleolus 03/16/2016   Health care maintenance 01/24/2016   Blood pressure elevated without history of HTN 10/25/2015   Essential hypertension 10/25/2015   Multiple sclerosis 10/02/2015   Chronic left shoulder pain 07/19/2015   Multiple sclerosis exacerbation 07/14/2015   MS (multiple sclerosis) 11/26/2014   Increased body mass index 11/26/2014   HPV test positive 11/26/2014   Status post laparoscopic supracervical hysterectomy 11/26/2014   Galactorrhea 11/26/2014   Back ache 05/21/2014   Adiposity  05/21/2014   Disordered sleep 05/21/2014   Muscle spasticity 05/21/2014   Spasticity 05/21/2014   Calculus of kidney 12/09/2013   Renal colic 12/09/2013   Hypercholesteremia 08/19/2013   Hereditary and idiopathic neuropathy 08/19/2013   Hypercholesterolemia without hypertriglyceridemia 08/19/2013   Bladder infection, chronic 07/25/2012   Disorder of bladder function 07/25/2012   Incomplete bladder emptying 07/25/2012   Microscopic hematuria 07/25/2012   Right upper quadrant pain 07/25/2012    ONSET DATE: 1995  REFERRING DIAG: MS  THERAPY DIAG:  Muscle weakness (generalized)  Other lack of coordination  Multiple sclerosis exacerbation  Difficulty in walking, not elsewhere classified  Unsteadiness on feet  Abnormality of gait and mobility  Other abnormalities of gait and mobility  Rationale for Evaluation and Treatment: Rehabilitation  SUBJECTIVE:                                                                                                                                                                                             SUBJECTIVE STATEMENT:  Pt reports she's feeling pretty good today and has no complaints.  She denies getting an update on her standing frame at this time.  Pt accompanied by: self  PERTINENT HISTORY:  Patient is returning to PT s/p hospitalization.  s/p ORIF of R tibia shaft fracture 09/23/2022. Patient has weakness in BLE with RLE>LLE. She drives with hand controls. Patient has been diagnosed with MS in 1995. PMH includes: back pain, CBP, chronic L shoulder pain, galactorrhea, neuropathy, HPV, hypercholestermeia, HTN, MS, osteopenia, PONV, wrist fracture. Additional order for other closed fracture of proximal end of R tibia with routine healing. Still has to use [employer].   PAIN:  Are you having pain? Occasional pain in RLE; primarily in knee  PRECAUTIONS: Fall  RED FLAGS: None   WEIGHT BEARING RESTRICTIONS: No  FALLS: Has patient  fallen in last 6 months? No  LIVING ENVIRONMENT: Lives with: lives with their family Lives in: House/apartment Stairs: ramp Has following equipment at home: Vannie - 2 wheeled, Wheelchair (manual), shower chair, and Grab bars  PLOF: Independent with  household mobility with device  PATIENT GOALS: to get her independence back. To be able to get into car, toilet, and get dressed independently  OBJECTIVE:  Note: Objective measures were completed at Evaluation unless otherwise noted.  DIAGNOSTIC FINDINGS: MRI of the brain 03/18/2020 showed multiple T2/FLAIR hyperintense foci in the periventricular, juxtacortical and deep white matter.  There were no infratentorial lesions noted.  None of the foci enhanced.   Due to severe claustrophobia, the study was done with conscious sedation in the hospital   COGNITION: Overall cognitive status: Within functional limits for tasks assessed   SENSATION: Lack of sensation in feet, loss in bilateral lateral aspect of knee  COORDINATION: Does not have the strength for functional LE heel slide test   MUSCLE TONE: BLE mild tone    POSTURE: rounded shoulders, forward head, anterior pelvic tilt, and weight shift left   LOWER EXTREMITY MMT:    MMT Right Eval Left Eval  Hip flexion 0.9 1.5  Hip extension    Hip abduction 1.8 2.6  Hip adduction 3.1 1.9  Hip internal rotation    Hip external rotation    Knee flexion 0.9 1.5  Knee extension 0.8 1.2  Ankle dorsiflexion    Ankle plantarflexion    Ankle inversion    Ankle eversion    (Blank rows = not tested)  BED MOBILITY:  Assess in future session due to limited time  TRANSFERS: Assistive device utilized: Bariatric RW with sit to stands, slide board to table  Sit to stand: unable to reach full stand Stand to sit: unable to reach full stand  Chair to chair: slide board with CGA   GAIT: Unable to ambulate at this time.   FUNCTIONAL TESTS:  Sit to stand: tricep press with BUE; unable to  bring arm to walker.    Function In Sitting Test (FIST)  (1/2 femur on surface; hips/knees flexed to 90deg)   - indicate bed or mat table / step stool if used  SCORING KEY: 4 = Independent (completes task independently & successfully) 3 = Verbal Cues/Increased Time (completes task independently & successfully and only needs more time/cues) 2 = Upper Extremity Support (must use UE for support or assistance to complete successfully) 1 = Needs Assistance (unable to complete w/o physical assist; DOCUMENT LEVEL: min, mod, max) 0 = Dependent (requires complete physical assist; unable to complete successfully even w/ physical assist)  Randomly Administer Once Throughout Exam  4 - Anterior Nudge (superior sternum)  4 - Posterior Nudge (between scapular spines)  4 - Lateral Nudge (to dominant side at acromion)     4 - Static sitting (30 seconds)  4 - Sitting, shake 'no' (left and right)  4 - Sitting, eyes closed (30 seconds)   0 - Sitting, lift foot (dominant side, lift foot 1 inch twice)    2 - Pick up object from behind (object at midline, hands breadth posterior)  3 - Forward reach (use dominant arm, must complete full motion) 2 - Lateral reach (use dominant arm, clear opposite ischial tuberosity) 2 - Pick up object from floor (from between feet)   2 - Posterior scooting (move backwards 2 inches)  2 - Anterior scooting (move forward 2 inches)  2 - Lateral scooting (move to dominant side 2 inches)    TOTAL = 39/56  Notes/comments: slide board tranfer to/from table   MCD > 5 points MCID for IP REHAB > 6 points  Function In Sitting Test (FIST) 08/14/23 (1/2 femur on surface; hips/knees  flexed to 90deg)   - indicate bed or mat table / step stool if used  SCORING KEY: 4 = Independent (completes task independently & successfully) 3 = Verbal Cues/Increased Time (completes task independently & successfully and only needs more time/cues) 2 = Upper Extremity Support (must use UE for  support or assistance to complete successfully) 1 = Needs Assistance (unable to complete w/o physical assist; DOCUMENT LEVEL: min, mod, max) 0 = Dependent (requires complete physical assist; unable to complete successfully even w/ physical assist)  Randomly Administer Once Throughout Exam  4 - Anterior Nudge (superior sternum)  4 - Posterior Nudge (between scapular spines)  4 - Lateral Nudge (to dominant side at acromion)     4 - Static sitting (30 seconds)  4 - Sitting, shake 'no' (left and right)  4 - Sitting, eyes closed (30 seconds)   0 - Sitting, lift foot (dominant side, lift foot 1 inch twice)    4 - Pick up object from behind (object at midline, hands breadth posterior)  4 - Forward reach (use dominant arm, must complete full motion) 4 - Lateral reach (use dominant arm, clear opposite ischial tuberosity)  - Pick up object from floor (from between feet)   3 - Posterior scooting (move backwards 2 inches)  3 - Anterior scooting (move forward 2 inches)  3 - Lateral scooting (move to dominant side 2 inches)    TOTAL = 47/56  MCD > 5 points MCID for IP REHAB > 6 points   TREATMENT DATE: 02/21/24    TherEx:  Seated scapular rows, 17.5#, x10; 22.5#, x10 Seated lat lift offs in the chair, 2x5   Gait:  Pt ambulated for a total of 4 attempts, 6' on first attempt, 92' on the second attempt, 12' on the third attempt, and 6' on the final attempt (36' in total).  Pt utilized the bariatric FWW, pillow under the thighs for easier ability to come upright, and sock applied to the L foot to reduce friction of the foot in mobility.    PATIENT EDUCATION: Education details: Pt educated throughout session about proper posture and technique with exercises. Improved exercise technique, movement at target joints, use of target muscles after min to mod verbal, visual, tactile cues.  Person educated: Patient Education method: Explanation, Demonstration, Tactile cues, and Verbal  cues Education comprehension: verbalized understanding, returned demonstration, verbal cues required, tactile cues required, and needs further education  HOME EXERCISE PROGRAM: Stand 3x/day in stander   GOALS: Goals reviewed with patient? Yes  SHORT TERM GOALS: Target date: 08/08/2023  Patient will be independent in home exercise program to improve strength/mobility for better functional independence with ADLs.  Baseline: 4/8: compliance Goal status: MET    LONG TERM GOALS: Target date: 03/20/2024  Patient will tolerate five consecutive stands with UE support from wheelchair to standing to improve functional mobility with additional cushion and RW.  Baseline: unable to perform 4/8:unable to perform full stand 5/20: perform next session 5/29: two full stands with CGA and cushion on her seat 7/31: able to stand 3 times from [facility]. With no addition cushion  in parallel bars. 12/27/2023= Will retest next session- concentrated on just standing on past 2 visits Goal status: Ongoing   2.  Patient will ambulate 10 ft with bRW with wheelchair follow.  Baseline:  unable to ambulate 4/8: unable to ambulate 5/20: able to ambulate length of // bars in previous session 5/27: ambulate 2.5 lengths of // bars with two seated rest breaks with  min A for weight shift and wheelchair follow  7/31: able to ambulate in parallel bars 3 ft with min assist and +2 follow in WC> difficulty advancing the LLE on this day. 12/27/2023- Patient unable to take a step forward today but was abel to take a step on previous visit- limited by clonus like trembling that has progressed over past month. Goal status: Ongoing   3.  Patient will improve FIST score >6 points to demonstrate improved stability and ability to perform ADLs.  Baseline:  3/6: 39/56 4/8: 47/ 56 5/20: 48/56 7/31:46/56 Goal status: MET  4.  Patient will perform toileting with mod I at home for improved independence.  Baseline: 3/6: requires assistance 4/8:  requires dependence on machine 5/20: requires assistance with use of wipe buddy.  7/31: continues to require use of sara steady and wipe buddy for safety and hygiene. 12/27/2023- Patient reports that since she is unable to take steps that she continues to require use of sara steady and wipe buddy for safe personal hygiene. Goal status: ongoing   5. Patient will perform > 5 min of static or dynamic standing with BUE and Min/mod assist  for improved LE strength and pre-gait function to assist with toileting and overall standing ability for improve functional independence.  Baseline: 12/27/2023-Patient was able to stand for just over 3 min today. 01/10/24: Pt able to stand 1 min 38 sec 02/14/24: Pt able to >3 minutes at this time. Goal status: PROGRESSING    ASSESSMENT:  CLINICAL IMPRESSION:  Pt was unable to ambulate further than the previous visit with ambulation attempts, but was able to perform more standing reps with 4 total reps.  Pt ultimately is still making significant improvements and is able to tolerate more upright activities than she has been able to do in the past.   Pt will continue to benefit from skilled therapy to address remaining deficits in order to improve overall QoL and return to PLOF.        OBJECTIVE IMPAIRMENTS: Abnormal gait, cardiopulmonary status limiting activity, decreased activity tolerance, decreased balance, decreased coordination, decreased endurance, decreased mobility, difficulty walking, decreased ROM, decreased strength, hypomobility, increased fascial restrictions, impaired perceived functional ability, impaired flexibility, impaired sensation, improper body mechanics, and postural dysfunction.   ACTIVITY LIMITATIONS: carrying, lifting, bending, sitting, standing, squatting, sleeping, stairs, transfers, bed mobility, continence, bathing, toileting, dressing, self feeding, reach over head, hygiene/grooming, locomotion level, and caring for  others  PARTICIPATION LIMITATIONS: meal prep, cleaning, laundry, interpersonal relationship, driving, shopping, community activity, and church  PERSONAL FACTORS: Age, Past/current experiences, Time since onset of injury/illness/exacerbation, Transportation, and 3+ comorbidities: ack pain, CBP, chronic L shoulder pain, galactorrhea, neuropathy, HPV, hypercholestermeia, HTN, MS, osteopenia, PONV, wrist fracture are also affecting patient's functional outcome.   REHAB POTENTIAL: Good  CLINICAL DECISION MAKING: Evolving/moderate complexity  EVALUATION COMPLEXITY: Moderate  PLAN:  PT FREQUENCY: 2x/week  PT DURATION: 12 weeks  PLANNED INTERVENTIONS: [id number]- PT Re-evaluation, 97110-Therapeutic exercises, 97530- Therapeutic activity, 97112- Neuromuscular re-education, 97535- Self Care, [id number]- Manual therapy, [phone]- Gait training, [phone]- Orthotic Fit/training, [phone]- Canalith repositioning, [id number]- Aquatic Therapy, [phone]- Electrical stimulation (unattended), [phone]- Electrical stimulation (manual), [id number]- Ultrasound, [id number]- Traction (mechanical), Patient/Family education, Balance training, Stair training, Taping, Dry Needling, Joint mobilization, Joint manipulation, Spinal manipulation, Spinal mobilization, Scar mobilization, Compression bandaging, Vestibular training, Visual/preceptual remediation/compensation, Cognitive remediation, DME instructions, Cryotherapy, Moist heat, and Biofeedback  PLAN FOR NEXT SESSION:  Strap stand frame with more dynamic activities Sit to stands; try supported walking, continue dynamic core stabilization training  and seated reaching tasks to improve weight shifting.    Fonda Simpers, PT, DPT Physical Therapist - [facility]  02/21/24, 12:20 PM

## 2024-02-21 NOTE — Therapy (Signed)
 OUTPATIENT OCCUPATIONAL THERAPY NEURO TREATMENT NOTE  Patient Name: Lisa Williams MRN: [id number] DOB:1960-11-03, 63 y.o., female Today's Date: 02/21/2024  PCP: Dr. Layman Piety   Neurologist Dr. Suanne at [facility] REFERRING PROVIDER: Dr. Layman Piety    END OF SESSION:  OT End of Session - 02/21/24 1119     Visit Number 48    Number of Visits 67    Date for Recertification  04/29/24    Authorization Time Period Reporting period beginning 01/24/24    Progress Note Due on Visit 50    OT Start Time 1100    OT Stop Time 1140    OT Time Calculation (min) 40 min    Equipment Utilized During Treatment manual wc    Activity Tolerance Patient tolerated treatment well    Behavior During Therapy [facility] for tasks assessed/performed         Past Medical History:  Diagnosis Date   Abdominal pain, right upper quadrant    Back pain    Calculus of kidney 12/09/2013   Chronic back pain    unspecified   Chronic left shoulder pain 07/19/2015   Complication of anesthesia    Functional disorder of bladder    other   Galactorrhea 11/26/2014   Chronic    Hereditary and idiopathic neuropathy 08/19/2013   History of kidney stones    HPV test positive    Hypercholesteremia 08/19/2013   Hypertension    Incomplete bladder emptying    Microscopic hematuria    MS (multiple sclerosis)    Muscle spasticity 05/21/2014   Nonspecific findings on examination of urine    other   Osteopenia    PONV (postoperative nausea and vomiting)    Status post laparoscopic supracervical hysterectomy 11/26/2014   Tobacco user 11/26/2014   Wrist fracture    Past Surgical History:  Procedure Laterality Date   bilateral tubal ligation  1996   BREAST CYST EXCISION Left 2002   CYST EXCISION Left 05/10/2022   Procedure: CYST REMOVAL;  Surgeon: Rodolph Romano, MD;  Location: [facility] ORS;  Service: General;  Laterality: Left;   FRACTURE SURGERY     KNEE SURGERY     right   LAPAROSCOPIC  SUPRACERVICAL HYSTERECTOMY  08/05/2013   ORIF WRIST FRACTURE Left 01/17/2017   Procedure: OPEN REDUCTION INTERNAL FIXATION (ORIF) WRIST FRACTURE;  Surgeon: Leora Lynwood SAUNDERS, MD;  Location: [facility] ORS;  Service: Orthopedics;  Laterality: Left;   RADIOLOGY WITH ANESTHESIA N/A 03/18/2020   Procedure: MRI WITH ANESTHESIA CERVICAL SPINE AND BRAIN  WITH AND WITHOUT CONTRAST;  Surgeon: Radiologist, Medication, MD;  Location: [facility] OR;  Service: Radiology;  Laterality: N/A;   TUBAL LIGATION Bilateral    VAGINAL HYSTERECTOMY  03/2006   Patient Active Problem List   Diagnosis Date Noted   Hyperglycemia 01/09/2024   Abnormal LFTs (liver function tests) 09/17/2023   Sinus tachycardia 07/07/2023   Aortic atherosclerosis 01/18/2023   Prediabetes 01/18/2023   Hypokalemia 07/29/2022   Weakness of both lower extremities 07/29/2022   Abscess of left groin 11/15/2021   Abnormal LFTs 11/15/2021   Acute respiratory disease due to COVID-19 virus 11/21/2020   Weakness    Hypoalbuminemia due to protein-calorie malnutrition    Neurogenic bowel    Neurogenic bladder    Labile blood pressure    Neuropathic pain    Multiple sclerosis, relapsing-remitting 07/09/2019   Abscess of female pelvis    SVT (supraventricular tachycardia)    Radial styloid tenosynovitis 03/12/2018   Wheelchair dependence 02/27/2018   Localized  osteoporosis with current pathological fracture with routine healing 01/19/2017   Wrist fracture 01/16/2017   Sprain of ankle 03/23/2016   Closed fracture of lateral malleolus 03/16/2016   Health care maintenance 01/24/2016   Blood pressure elevated without history of HTN 10/25/2015   Essential hypertension 10/25/2015   Multiple sclerosis 10/02/2015   Chronic left shoulder pain 07/19/2015   Multiple sclerosis exacerbation 07/14/2015   MS (multiple sclerosis) 11/26/2014   Increased body mass index 11/26/2014   HPV test positive 11/26/2014   Status post laparoscopic supracervical hysterectomy  11/26/2014   Galactorrhea 11/26/2014   Back ache 05/21/2014   Adiposity 05/21/2014   Disordered sleep 05/21/2014   Muscle spasticity 05/21/2014   Spasticity 05/21/2014   Calculus of kidney 12/09/2013   Renal colic 12/09/2013   Hypercholesteremia 08/19/2013   Hereditary and idiopathic neuropathy 08/19/2013   Hypercholesterolemia without hypertriglyceridemia 08/19/2013   Bladder infection, chronic 07/25/2012   Disorder of bladder function 07/25/2012   Incomplete bladder emptying 07/25/2012   Microscopic hematuria 07/25/2012   Right upper quadrant pain 07/25/2012   ONSET DATE: 5/24 Amon and broke R leg which caused beginning of ADL decline; diagnosed with MS in 1995)  REFERRING DIAG: MS  THERAPY DIAG:  Muscle weakness (generalized)  Other lack of coordination  Multiple sclerosis exacerbation  Rationale for Evaluation and Treatment: Rehabilitation  SUBJECTIVE:  SUBJECTIVE STATEMENT: Pt reports feeling tired today. Pt accompanied by: self, pt's mother  PERTINENT HISTORY: Pt reports increased difficulty with basic self care tasks since breaking her R leg last May.  Since that time, mobility and ADLs have declined, with pt requiring assist from private caregivers and family to manage bathing, toileting, dressing, and functional transfers.  PRECAUTIONS: Fall  WEIGHT BEARING RESTRICTIONS: No  PAIN: 02/21/24: No pain today  Are you having pain? No; occasional pain in R lower back, but not today  FALLS: Has patient fallen in last 6 months? No Last fall was May 17 of 2024  LIVING ENVIRONMENT: Lives with: lives with their family (including mother who has dementia and sister [name])  Lives in: 2 level home but pt resides on main level Stairs: ramp  Has following equipment at home: Wheelchair (manual), Shower bench, and hand held shower shower, 1 grab bar in the shower, 3in1 commode, sit to stand lift (electric), FWW, hospital bed  PLOF: modified indep with ADL/IADLs prior to  May of 2024   PATIENT GOALS: Increase independence with basic self care tasks  OBJECTIVE:  Note: Objective measures were completed at Evaluation unless otherwise noted.  HAND DOMINANCE: Right  ADLs:  Overall ADLs: assist provided from paid caregiver and sister Transfers/ambulation related to ADLs: sit to stand lift for all transfers, set up for sliding board in/out of bed  Eating: indep  Grooming: modified indep (wc level in mom's bathroom)  UB Dressing: set up (can't access her bedroom closet)  LB Dressing: able to sit on side of bed to don all LB clothing; assist to hike pants/underwear Toileting: assist with clothing management d/t standing within electric lift Bathing: bed bath with sister helping to wash backside Tub Shower transfers: N/A; pt has walk in shower in mom's bathroom (wc does fit in this bathroom but pt reports inability to transfer from wc and requires arm rests on a bench for a successful transfer attempt from any DME).  Tub bench is in pt's bathroom, but lift does not fit through bathroom doorway and pt reports inability to transfer up from tub bench (currently unable to manage either  transfer)   Equipment: see above  IADLs: Shopping: [employer] for shopping Light housekeeping: modified indep from wc level Meal Prep: modified indep from wc level Community mobility: relies on community transportation or family members Medication management: indep Landscape architect: indep Handwriting: NT; pt denies any FMC challenges  MOBILITY STATUS: Hx of falls  POSTURE COMMENTS:  Rounded shoulders, forward head, anterior pelvic tilt, and weight shift left   ACTIVITY TOLERANCE: Eval: Activity tolerance: Per PT; currently standing 30-60 sec within Light Gait harness/lift; currently non-ambulatory  01/24/24: Standing at sink: 1 minute; min A and 5 attempts to achieve fully erect standing position with BUEs supported on sink countertop, OT assist to block R knee to prevent  buckling   UPPER EXTREMITY ROM:  BUEs WFL  UPPER EXTREMITY MMT:     MMT Right eval Left eval  Shoulder flexion 4+ 4  Shoulder abduction 4+ 4  Shoulder adduction    Shoulder extension    Shoulder internal rotation 4+ 4+  Shoulder external rotation 4 4  Middle trapezius    Lower trapezius    Elbow flexion 4+ 4+  Elbow extension 4+ 4+  Wrist flexion 4+ 4+  Wrist extension 4+ 4+  Wrist ulnar deviation    Wrist radial deviation    Wrist pronation    Wrist supination    (Blank rows = not tested)  HAND FUNCTION/COORDINATION:  R/L WNL; pt denies any coordination deficits/ able to manipulate pills/clothing fasteners/etc without difficulty  11/15/23: *Measures taken d/t PT note revealing pt with increased difficulty manipulating smaller ADL supplies during reaching activities in PT session   11/15/23: Grip strength: R: 70 lbs, L: 63 lbs Pinch strength: Lateral: R: 16 lbs, L: 15 lbs; 3 point pinch: R: 12 lbs, L: 13 lbs 9 hole peg test: Right: 25 sec, L 49 sec   12/25/23: Grip strength: Right: 70 lbs; Left: 65 lbs  9 hole peg test: Right: 24 sec; Left: 3 trials: 54 sec, 1 min, 56 sec  01/24/24: Grip grip: Right: 76 lbs; Left: 67 lbs Lateral pinch: R: 17 lbs, L: 17 lbs; 3 point pinch: R: 13 lbs, L: 14 lbs  9 Hole Peg Test: R: 26 sec, L: 50 sec  SENSATION: WFL  EDEMA: No visible edema in BUEs  MUSCLE TONE: BUEs WNL  COGNITION: Overall cognitive status: Within functional limits for tasks assessed  VISION: wears glasses all the time, no reports of diplopia  PERCEPTION: Not tested  PRAXIS: WFL  OBSERVATIONS:  Pt pleasant, cooperative, and motivated to work towards improving indep with ADLs.                                                                                                     TREATMENT DATE: 02/21/24 Self Care -Sit to stand trials at sink x5 in prep for ADL transfers.  2nd wc cushion placed beneath pt to ease STS attempts; min A and mod vc for forward weight  shifts.   Therapeutic Activity: -Facilitated L/R FMC/dexterity skills, working to pick up and place Cabin crew.  Pt practiced storing up  to 2 pegs in L hand, and worked with washers and spacers 1 at a time.  OT assist to set up spacers upright on board for easier pick up d/t increased difficulty with moving these pieces from horizontal to vertical position in prep to place over pegs on board.   PATIENT EDUCATION: Education details: STS transfer technique Person educated: Patient Education method: Explanation, Demonstration, Tactile cues, and Verbal cues Education comprehension: verbalized understanding and needs further education  HOME EXERCISE PROGRAM: IR A/AAROM to promote shoulder flexibility for peri care/bathing  GOALS: Goals reviewed with patient? Yes  SHORT TERM GOALS: Target date: 12/25/23  Pt will perform bed bath with set up only. Baseline: Eval: Min-mod A for posterior washing; 09/28/23: Min A for posterior washing; 11/13/23: Able to manage bed bath but sister helps with thoroughness, per pt's preference; pt in agreement to trial bath sitting EOB or in wc before next OT session. Goal status: achieved/d/c  2.  Pt will utilize sliding board for wc<>drop arm commode transfer with SBA. Baseline: Eval: Currently using electric lift for transfer to [facility]; 09/28/23: Not yet completed at home but transfers to commode have been easier without sliding board d/t pt implementing a scoot pivot. Goal status: d/c (pt does not prefer sliding board for commode transfer)  3.  Pt will be indep to perform HEP for maintaining BUE strength for ADLs and functional transfers. Baseline: Eval: HEP not yet initiated; 09/28/23: Pt is working on core stability exercises from wc, cane stretches for bilat shoulder IR, and tricep extension via wc pushups; 11/13/23: indep Goal status: achieved  LONG TERM GOALS: Target date: 02/05/24  Pt will perform sink bath with set up A. (Revised on 11/13/23  from min A to set up) Baseline: Eval: Currently bed bathing d/t inability to stand at sink without electric lift (lift does not fit into pt's bathroom); 09/28/23: still completing bed bath as pt is not yet able to stand; 11/13/23: Not yet attempted; pt encouraged to attempt before next OT session now that bed bath goal has been met; 12/20/23: Pt is now completing sponge bath with min A while seated EOB (caregiver assists with washing peri area d/t no bed rail to aid in R lateral lean); 01/24/24: Performing with set up on EOB, completing lateral weight shifts to wash posterior/peri area with wc in front of pt and bed rail for LUE support and foot of bed for RUE support  Goal status: achieved  2.  Pt will perform wc<>tub bench transfer with min A. Baseline: Eval: Unable; 09/28/23: Performed in OT clinic with min A, but not yet in the home Goal status: d/c (pt prefers sink bath)  3.  Pt will perform squat pivot transfer wc<>BSC with SBA.  Baseline: Eval: Eval: Currently using electric lift for transfer to [facility]; 09/28/23: Not yet completed in clinic with [facility], and using lift for [facility] in the home still; 11/13/23: Still using Camie lift at home and in clinic transfers are all scoot rather than squat pivots d/t LE weakness; 12/20/23: Pt reports that she plans to use her Camie lift until she can ambulate to her [facility] d/t limited space for wc in her desired location for [facility] at home. Goal status: d/c  4. Pt will perform scoot transfer wc<>toilet using grab bars with supv to allow toileting in community setting.  Baseline: Eval: Pt limits community outings d/t requiring use of lift for [facility] transfers; 09/28/23: Pt has performed 2 successful scoot pivot  Transfers to toilet in OT  clinic with min A, extra time.  Further trials with clothing management on toilet needed, but pt can lower and hike  pants while sititng edge of mat via lateral leaning and min A to maintain core stability; 11/13/23: Pt performs with CGA and extra time;  12/19/23: Pt  performs with close supv on most attempts, occasional CGA; efficiency is improving; 01/24/24: ongoing improvements; able to complete in fewer scoots (3 scoots each way from wc<>toilet) d/t improve engagement of Les and improved anterior weight shift; occasional min guard-supv; pt completes with therapist in clinic, but not yet in other community settings with sister, and not yet voided on toilet in community    Goal status: ongoing  5.  Pt will complete seated clothing management with min A to enable voiding in handicapped stall within community. Baseline: Recert 11/13/23: Pt can transfer to toilet with increased time and effort, but has not yet attempted clothing management or voiding while seated on commode in community setting.  Pt can lower pants to knees while seated in wc, but requires mod A to hike over hips from seated position; 12/20/23: Pt fully lowers panties and shorts while seated on commode with supv, can hike clothing to her her upper thighs, transfers back to wc, and then performs a tricep extension from wc level to allow caregiver to hike clothing remainder of the way for purposes of EC (min A for clothing management component) Goal status: achieved  6.  Pt will tolerate standing x1 min with close supv and BUE support to manage clothing in prep for toileting. Baseline: Eval: Currently standing in electric lift only, or within parallel bars without lift; 09/28/23: Not yet attempted; 11/13/23: Pt is able to perform static standing with heavy BUE support on parallel bars with PT; 12/19/22: Not attempted recently d/t new intermittent clonus in BLEs; 01/24/24: Tolerated standing 1 min today at sink with min A for ascent, OT assist to block R knee from buckling Goal status: in progress  7.  Pt will increase bilat grip strength in order to securely grasp walker sufficiently for functional transfers and ambulation.  Baseline: Recert 11/13/23: TBD (PT note read following OT session, indicating  pt with difficulty securing items in hand);  11/15/23: Grip strength: R: 70 lbs, L: 63 lbs; 12/25/23: R 76 lbs, L 67 lbs; sufficient for functional transfers/amb  Goal status: achieved  8.  Pt will increase L [facility] skills in order to manipulate small ADL supplies with reduced dropping as indicated by 20 sec or more improvement in 9 hole peg test. Baseline: Recert 11/13/23: 9 hole peg test TBD (PT note read following OT session, indicating pt with difficulty manipulating and securing items in hands); 11/15/23: Right: 25 sec, L 49 sec; 12/25/23: Right: 24 sec; Left: 3 trials: 54 sec, 1 min, 56 sec; 01/24/24: R: 26 sec, L: 50 sec  Goal status: in progress  ASSESSMENT: CLINICAL IMPRESSION: Increased LE fatigue today after mobility during PT session.  Pt still agreeable to attempt STS at sink, but unable to achieve upright standing position today.  Transitioned focus to bilat hand FMC/dexterity skills at table top level.  Pt demonstrated frequent dropping with small [employer] Pegboard pieces, but was able to complete assembly with assist to set up spacers upright for easier placement overtop each peg.  Pt will continue to benefit from skilled OT to work towards above noted goals in OT poc, working to maximize indep with daily tasks while reducing burden of care on caregivers.   PERFORMANCE DEFICITS:  in functional skills including ADLs, IADLs, strength, pain, flexibility, Gross motor control, mobility, balance, body mechanics, endurance, and decreased knowledge of use of DME, and psychosocial skills including coping strategies, environmental adaptation, habits, and routines and behaviors.   IMPAIRMENTS: are limiting patient from ADLs, IADLs, and social participation.   CO-MORBIDITIES: has co-morbidities such as neuropathy, back pain, obesity, MS, HTN that affects occupational performance. Patient will benefit from skilled OT to address above impairments and improve overall function.  MODIFICATION OR ASSISTANCE TO  COMPLETE EVALUATION: No modification of tasks or assist necessary to complete an evaluation.  OT OCCUPATIONAL PROFILE AND HISTORY: Detailed assessment: Review of records and additional review of physical, cognitive, psychosocial history related to current functional performance.  CLINICAL DECISION MAKING: Moderate - several treatment options, min-mod task modification necessary  REHAB POTENTIAL: Good  EVALUATION COMPLEXITY: Moderate  PLAN:  OT FREQUENCY: 2x/week  OT DURATION: 12 weeks  PLANNED INTERVENTIONS: 97168 OT Re-evaluation, 97535 self care/ADL training, 02889 therapeutic exercise, 97530 therapeutic activity, 97112 neuromuscular re-education, 97140 manual therapy, 97116 gait training, 02989 moist heat, 97010 cryotherapy, balance training, functional mobility training, psychosocial skills training, energy conservation, coping strategies training, patient/family education, and DME and/or AE instructions  RECOMMENDED OTHER SERVICES: None at this time (Pt currently receiving PT services in this clinic)  CONSULTED AND AGREED WITH PLAN OF CARE: Patient  PLAN FOR NEXT SESSION: see above  Inocente Blazing, MS, OTR/L  Inocente MARLA Blazing, OT 02/21/2024, 11:21 AM

## 2024-02-22 ENCOUNTER — Other Ambulatory Visit

## 2024-02-22 ENCOUNTER — Ambulatory Visit: Admitting: Physical Therapy

## 2024-02-25 ENCOUNTER — Other Ambulatory Visit (INDEPENDENT_AMBULATORY_CARE_PROVIDER_SITE_OTHER)

## 2024-02-25 DIAGNOSIS — E7800 Pure hypercholesterolemia, unspecified: Secondary | ICD-10-CM | POA: Diagnosis not present

## 2024-02-25 LAB — TSH: TSH: 14.87 u[IU]/mL — ABNORMAL HIGH (ref 0.35–5.50)

## 2024-02-26 ENCOUNTER — Ambulatory Visit: Payer: Self-pay | Admitting: Internal Medicine

## 2024-02-26 ENCOUNTER — Ambulatory Visit: Payer: PPO

## 2024-02-26 ENCOUNTER — Ambulatory Visit

## 2024-02-26 DIAGNOSIS — R2681 Unsteadiness on feet: Secondary | ICD-10-CM

## 2024-02-26 DIAGNOSIS — M6281 Muscle weakness (generalized): Secondary | ICD-10-CM | POA: Diagnosis not present

## 2024-02-26 DIAGNOSIS — R278 Other lack of coordination: Secondary | ICD-10-CM

## 2024-02-26 DIAGNOSIS — E7800 Pure hypercholesterolemia, unspecified: Secondary | ICD-10-CM

## 2024-02-26 DIAGNOSIS — R262 Difficulty in walking, not elsewhere classified: Secondary | ICD-10-CM

## 2024-02-26 DIAGNOSIS — R2689 Other abnormalities of gait and mobility: Secondary | ICD-10-CM

## 2024-02-26 DIAGNOSIS — R269 Unspecified abnormalities of gait and mobility: Secondary | ICD-10-CM

## 2024-02-26 DIAGNOSIS — G35D Multiple sclerosis, unspecified: Secondary | ICD-10-CM

## 2024-02-26 NOTE — Telephone Encounter (Signed)
 Copied from CRM #[id number]. Topic: Appointments - Scheduling Inquiry for Clinic >> Feb 26, 2024 12:47 PM Mesmerise C wrote: Reason for CRM: Patient inquiring if she can have have her TSH lab done at the hospital she has an appointment on 12/2

## 2024-02-26 NOTE — Telephone Encounter (Signed)
 Copied from CRM #[id number]. Topic: Clinical - Lab/Test Results >> Feb 26, 2024 12:39 PM Mesmerise C wrote: Reason for CRM: Read test results verbatim states she does still take her synthroid  50mcg q every day advised can send new rx to [facility] pharmacy on file and scheduled lab appointment

## 2024-02-26 NOTE — Therapy (Signed)
 OUTPATIENT PHYSICAL THERAPY NEURO TREATMENT   Patient Name: Lisa Williams MRN: [id number] DOB:06/06/1960, 63 y.o., female Today's Date: 02/26/2024  PCP: Lenon Na  REFERRING PROVIDER: Lenon Na   END OF SESSION:  PT End of Session - 02/26/24 1219     Visit Number 63    Number of Visits 70    Date for Recertification  03/20/24   corrected   Progress Note Due on Visit 50    PT Start Time 1016    PT Stop Time 1100    PT Time Calculation (min) 44 min    Equipment Utilized During Treatment Gait belt    Activity Tolerance Patient tolerated treatment well;No increased pain    Behavior During Therapy [facility] for tasks assessed/performed           Past Medical History:  Diagnosis Date   Abdominal pain, right upper quadrant    Back pain    Calculus of kidney 12/09/2013   Chronic back pain    unspecified   Chronic left shoulder pain 07/19/2015   Complication of anesthesia    Functional disorder of bladder    other   Galactorrhea 11/26/2014   Chronic    Hereditary and idiopathic neuropathy 08/19/2013   History of kidney stones    HPV test positive    Hypercholesteremia 08/19/2013   Hypertension    Incomplete bladder emptying    Microscopic hematuria    MS (multiple sclerosis)    Muscle spasticity 05/21/2014   Nonspecific findings on examination of urine    other   Osteopenia    PONV (postoperative nausea and vomiting)    Status post laparoscopic supracervical hysterectomy 11/26/2014   Tobacco user 11/26/2014   Wrist fracture    Past Surgical History:  Procedure Laterality Date   bilateral tubal ligation  1996   BREAST CYST EXCISION Left 2002   CYST EXCISION Left 05/10/2022   Procedure: CYST REMOVAL;  Surgeon: Rodolph Romano, MD;  Location: [facility] OR[facility];  Service: General;  Laterality: Left;   FRACTURE SURGERY     KNEE SURGERY     right   LAPAROSCOPIC SUPRACERVICAL HYSTERECTOMY  08/05/2013   ORIF WRIST FRACTURE Left 01/17/2017    Procedure: OPEN REDUCTION INTERNAL FIXATION (ORIF) WRIST FRACTURE;  Surgeon: Leora Lynwood SAUNDERS, MD;  Location: [facility];  Service: Orthopedics;  Laterality: Left;   RADIOLOGY WITH ANESTHESIA N/A 03/18/2020   Procedure: MRI WITH ANESTHESIA CERVICAL SPINE AND BRAIN  WITH AND WITHOUT CONTRAST;  Surgeon: Radiologist, Medication, MD;  Location: [facility] OR;  Service: Radiology;  Laterality: N/A;   TUBAL LIGATION Bilateral    VAGINAL HYSTERECTOMY  03/2006   Patient Active Problem List   Diagnosis Date Noted   Hyperglycemia 01/09/2024   Abnormal LFTs (liver function tests) 09/17/2023   Sinus tachycardia 07/07/2023   Aortic atherosclerosis 01/18/2023   Prediabetes 01/18/2023   Hypokalemia 07/29/2022   Weakness of both lower extremities 07/29/2022   Abscess of left groin 11/15/2021   Abnormal LFTs 11/15/2021   Acute respiratory disease due to COVID-19 virus 11/21/2020   Weakness    Hypoalbuminemia due to protein-calorie malnutrition    Neurogenic bowel    Neurogenic bladder    Labile blood pressure    Neuropathic pain    Multiple sclerosis, relapsing-remitting 07/09/2019   Abscess of female pelvis    SVT (supraventricular tachycardia)    Radial styloid tenosynovitis 03/12/2018   Wheelchair dependence 02/27/2018   Localized osteoporosis with current pathological fracture with routine healing 01/19/2017   Wrist fracture  01/16/2017   Sprain of ankle 03/23/2016   Closed fracture of lateral malleolus 03/16/2016   Health care maintenance 01/24/2016   Blood pressure elevated without history of HTN 10/25/2015   Essential hypertension 10/25/2015   Multiple sclerosis 10/02/2015   Chronic left shoulder pain 07/19/2015   Multiple sclerosis exacerbation 07/14/2015   MS (multiple sclerosis) 11/26/2014   Increased body mass index 11/26/2014   HPV test positive 11/26/2014   Status post laparoscopic supracervical hysterectomy 11/26/2014   Galactorrhea 11/26/2014   Back ache 05/21/2014   Adiposity  05/21/2014   Disordered sleep 05/21/2014   Muscle spasticity 05/21/2014   Spasticity 05/21/2014   Calculus of kidney 12/09/2013   Renal colic 12/09/2013   Hypercholesteremia 08/19/2013   Hereditary and idiopathic neuropathy 08/19/2013   Hypercholesterolemia without hypertriglyceridemia 08/19/2013   Bladder infection, chronic 07/25/2012   Disorder of bladder function 07/25/2012   Incomplete bladder emptying 07/25/2012   Microscopic hematuria 07/25/2012   Right upper quadrant pain 07/25/2012    ONSET DATE: 1995  REFERRING DIAG: MS  THERAPY DIAG:  Muscle weakness (generalized)  Other lack of coordination  Multiple sclerosis exacerbation  Difficulty in walking, not elsewhere classified  Unsteadiness on feet  Abnormality of gait and mobility  Other abnormalities of gait and mobility  Rationale for Evaluation and Treatment: Rehabilitation  SUBJECTIVE:                                                                                                                                                                                             SUBJECTIVE STATEMENT:  Pt reports that she was sore from the LiteGait training on Friday, noting she really felt good in her legs.  Pt was happy that she volunteered to be a part of it.  Pt accompanied by: self  PERTINENT HISTORY:  Patient is returning to PT s/p hospitalization.  s/p ORIF of R tibia shaft fracture 09/23/2022. Patient has weakness in BLE with RLE>LLE. She drives with hand controls. Patient has been diagnosed with MS in 1995. PMH includes: back pain, CBP, chronic L shoulder pain, galactorrhea, neuropathy, HPV, hypercholestermeia, HTN, MS, osteopenia, PONV, wrist fracture. Additional order for other closed fracture of proximal end of R tibia with routine healing. Still has to use [employer].   PAIN:  Are you having pain? Occasional pain in RLE; primarily in knee  PRECAUTIONS: Fall  RED FLAGS: None   WEIGHT BEARING  RESTRICTIONS: No  FALLS: Has patient fallen in last 6 months? No  LIVING ENVIRONMENT: Lives with: lives with their family Lives in: House/apartment Stairs: ramp Has following equipment at home: Vannie - 2 wheeled, Wheelchair (manual),  shower chair, and Grab bars  PLOF: Independent with household mobility with device  PATIENT GOALS: to get her independence back. To be able to get into car, toilet, and get dressed independently  OBJECTIVE:  Note: Objective measures were completed at Evaluation unless otherwise noted.  DIAGNOSTIC FINDINGS: MRI of the brain 03/18/2020 showed multiple T2/FLAIR hyperintense foci in the periventricular, juxtacortical and deep white matter.  There were no infratentorial lesions noted.  None of the foci enhanced.   Due to severe claustrophobia, the study was done with conscious sedation in the hospital   COGNITION: Overall cognitive status: Within functional limits for tasks assessed   SENSATION: Lack of sensation in feet, loss in bilateral lateral aspect of knee  COORDINATION: Does not have the strength for functional LE heel slide test   MUSCLE TONE: BLE mild tone    POSTURE: rounded shoulders, forward head, anterior pelvic tilt, and weight shift left   LOWER EXTREMITY MMT:    MMT Right Eval Left Eval  Hip flexion 0.9 1.5  Hip extension    Hip abduction 1.8 2.6  Hip adduction 3.1 1.9  Hip internal rotation    Hip external rotation    Knee flexion 0.9 1.5  Knee extension 0.8 1.2  Ankle dorsiflexion    Ankle plantarflexion    Ankle inversion    Ankle eversion    (Blank rows = not tested)  BED MOBILITY:  Assess in future session due to limited time  TRANSFERS: Assistive device utilized: Bariatric RW with sit to stands, slide board to table  Sit to stand: unable to reach full stand Stand to sit: unable to reach full stand  Chair to chair: slide board with CGA   GAIT: Unable to ambulate at this time.   FUNCTIONAL TESTS:  Sit to  stand: tricep press with BUE; unable to bring arm to walker.    Function In Sitting Test (FIST)  (1/2 femur on surface; hips/knees flexed to 90deg)   - indicate bed or mat table / step stool if used  SCORING KEY: 4 = Independent (completes task independently & successfully) 3 = Verbal Cues/Increased Time (completes task independently & successfully and only needs more time/cues) 2 = Upper Extremity Support (must use UE for support or assistance to complete successfully) 1 = Needs Assistance (unable to complete w/o physical assist; DOCUMENT LEVEL: min, mod, max) 0 = Dependent (requires complete physical assist; unable to complete successfully even w/ physical assist)  Randomly Administer Once Throughout Exam  4 - Anterior Nudge (superior sternum)  4 - Posterior Nudge (between scapular spines)  4 - Lateral Nudge (to dominant side at acromion)     4 - Static sitting (30 seconds)  4 - Sitting, shake 'no' (left and right)  4 - Sitting, eyes closed (30 seconds)   0 - Sitting, lift foot (dominant side, lift foot 1 inch twice)    2 - Pick up object from behind (object at midline, hands breadth posterior)  3 - Forward reach (use dominant arm, must complete full motion) 2 - Lateral reach (use dominant arm, clear opposite ischial tuberosity) 2 - Pick up object from floor (from between feet)   2 - Posterior scooting (move backwards 2 inches)  2 - Anterior scooting (move forward 2 inches)  2 - Lateral scooting (move to dominant side 2 inches)    TOTAL = 39/56  Notes/comments: slide board tranfer to/from table   MCD > 5 points MCID for IP REHAB > 6 points  Function In  Sitting Test (FIST) 08/14/23 (1/2 femur on surface; hips/knees flexed to 90deg)   - indicate bed or mat table / step stool if used  SCORING KEY: 4 = Independent (completes task independently & successfully) 3 = Verbal Cues/Increased Time (completes task independently & successfully and only needs more time/cues) 2 =  Upper Extremity Support (must use UE for support or assistance to complete successfully) 1 = Needs Assistance (unable to complete w/o physical assist; DOCUMENT LEVEL: min, mod, max) 0 = Dependent (requires complete physical assist; unable to complete successfully even w/ physical assist)  Randomly Administer Once Throughout Exam  4 - Anterior Nudge (superior sternum)  4 - Posterior Nudge (between scapular spines)  4 - Lateral Nudge (to dominant side at acromion)     4 - Static sitting (30 seconds)  4 - Sitting, shake 'no' (left and right)  4 - Sitting, eyes closed (30 seconds)   0 - Sitting, lift foot (dominant side, lift foot 1 inch twice)    4 - Pick up object from behind (object at midline, hands breadth posterior)  4 - Forward reach (use dominant arm, must complete full motion) 4 - Lateral reach (use dominant arm, clear opposite ischial tuberosity)  - Pick up object from floor (from between feet)   3 - Posterior scooting (move backwards 2 inches)  3 - Anterior scooting (move forward 2 inches)  3 - Lateral scooting (move to dominant side 2 inches)    TOTAL = 47/56  MCD > 5 points MCID for IP REHAB > 6 points   TREATMENT DATE: 02/26/24    TherAct:  Transfer from wheelchair to plinth via sliding board  Rolling to side on plinth for donning of the support device for the LiteGait, x2 each direction  Attempt at bridge to lift buttock up, but unable to complete, however did have contraction of the musculature, just unable to clear buttocks     Gait:  Pt able to ambulate from the plinth to the treadmill with +2 assistance for navigating the feet and the device itself  Pt was able to ambulate for 3 minutes, totaling 82 feet on the treadmill at 0.3-0.4 mph and +2 assistance navigating the feet  Pt tolerated a standing rest break, before wanting to continue with ambulation.  Pt able to ambulate another 2+ minutes, totaling 172' total with the previous session added,  utilizing 0.4 mph and +2 assistance for navigating the feet    PATIENT EDUCATION: Education details: Pt educated throughout session about proper posture and technique with exercises. Improved exercise technique, movement at target joints, use of target muscles after min to mod verbal, visual, tactile cues.  Person educated: Patient Education method: Explanation, Demonstration, Tactile cues, and Verbal cues Education comprehension: verbalized understanding, returned demonstration, verbal cues required, tactile cues required, and needs further education  HOME EXERCISE PROGRAM: Stand 3x/day in stander   GOALS: Goals reviewed with patient? Yes  SHORT TERM GOALS: Target date: 08/08/2023  Patient will be independent in home exercise program to improve strength/mobility for better functional independence with ADLs.  Baseline: 4/8: compliance Goal status: MET    LONG TERM GOALS: Target date: 03/20/2024  Patient will tolerate five consecutive stands with UE support from wheelchair to standing to improve functional mobility with additional cushion and RW.  Baseline: unable to perform 4/8:unable to perform full stand 5/20: perform next session 5/29: two full stands with CGA and cushion on her seat 7/31: able to stand 3 times from [facility]. With no addition cushion  in parallel bars. 12/27/2023= Will retest next session- concentrated on just standing on past 2 visits Goal status: Ongoing   2.  Patient will ambulate 10 ft with bRW with wheelchair follow.  Baseline:  unable to ambulate 4/8: unable to ambulate 5/20: able to ambulate length of // bars in previous session 5/27: ambulate 2.5 lengths of // bars with two seated rest breaks with min A for weight shift and wheelchair follow  7/31: able to ambulate in parallel bars 3 ft with min assist and +2 follow in WC> difficulty advancing the LLE on this day. 12/27/2023- Patient unable to take a step forward today but was abel to take a step on previous visit-  limited by clonus like trembling that has progressed over past month. Goal status: Ongoing   3.  Patient will improve FIST score >6 points to demonstrate improved stability and ability to perform ADLs.  Baseline:  3/6: 39/56 4/8: 47/ 56 5/20: 48/56 7/31:46/56 Goal status: MET  4.  Patient will perform toileting with mod I at home for improved independence.  Baseline: 3/6: requires assistance 4/8: requires dependence on machine 5/20: requires assistance with use of wipe buddy.  7/31: continues to require use of sara steady and wipe buddy for safety and hygiene. 12/27/2023- Patient reports that since she is unable to take steps that she continues to require use of sara steady and wipe buddy for safe personal hygiene. Goal status: ongoing   5. Patient will perform > 5 min of static or dynamic standing with BUE and Min/mod assist  for improved LE strength and pre-gait function to assist with toileting and overall standing ability for improve functional independence.  Baseline: 12/27/2023-Patient was able to stand for just over 3 min today. 01/10/24: Pt able to stand 1 min 38 sec 02/14/24: Pt able to >3 minutes at this time. Goal status: PROGRESSING    ASSESSMENT:  CLINICAL IMPRESSION:  Pt responded well to the exercise and felt wiped out at the conclusion of the session.  Pt felt it was very beneficial to perform the tasks overground utilizing the LiteGait and will continue to work with it moving forward.  Pt is still wanting to ambulate with the use of the walker at the next visit.   Pt will continue to benefit from skilled therapy to address remaining deficits in order to improve overall QoL and return to PLOF.         OBJECTIVE IMPAIRMENTS: Abnormal gait, cardiopulmonary status limiting activity, decreased activity tolerance, decreased balance, decreased coordination, decreased endurance, decreased mobility, difficulty walking, decreased ROM, decreased strength, hypomobility, increased  fascial restrictions, impaired perceived functional ability, impaired flexibility, impaired sensation, improper body mechanics, and postural dysfunction.   ACTIVITY LIMITATIONS: carrying, lifting, bending, sitting, standing, squatting, sleeping, stairs, transfers, bed mobility, continence, bathing, toileting, dressing, self feeding, reach over head, hygiene/grooming, locomotion level, and caring for others  PARTICIPATION LIMITATIONS: meal prep, cleaning, laundry, interpersonal relationship, driving, shopping, community activity, and church  PERSONAL FACTORS: Age, Past/current experiences, Time since onset of injury/illness/exacerbation, Transportation, and 3+ comorbidities: ack pain, CBP, chronic L shoulder pain, galactorrhea, neuropathy, HPV, hypercholestermeia, HTN, MS, osteopenia, PONV, wrist fracture are also affecting patient's functional outcome.   REHAB POTENTIAL: Good  CLINICAL DECISION MAKING: Evolving/moderate complexity  EVALUATION COMPLEXITY: Moderate  PLAN:  PT FREQUENCY: 2x/week  PT DURATION: 12 weeks  PLANNED INTERVENTIONS: 97164- PT Re-evaluation, 97110-Therapeutic exercises, 97530- Therapeutic activity, [id number]- Neuromuscular re-education, 97535- Self Care, [id number]- Manual therapy, [id number]- Gait training, [phone]- Orthotic Fit/training, [phone]- Canalith repositioning,  02886- Aquatic Therapy, [id number]- Electrical stimulation (unattended), [phone]- Electrical stimulation (manual), [id number]- Ultrasound, [id number]- Traction (mechanical), Patient/Family education, Balance training, Stair training, Taping, Dry Needling, Joint mobilization, Joint manipulation, Spinal manipulation, Spinal mobilization, Scar mobilization, Compression bandaging, Vestibular training, Visual/preceptual remediation/compensation, Cognitive remediation, DME instructions, Cryotherapy, Moist heat, and Biofeedback  PLAN FOR NEXT SESSION:  Strap stand frame with more dynamic activities Sit to stands; try supported walking,  continue dynamic core stabilization training and seated reaching tasks to improve weight shifting.    Fonda Simpers, PT, DPT Physical Therapist - [facility]  02/26/24, 1:07 PM

## 2024-02-27 MED ORDER — LEVOTHYROXINE SODIUM 75 MCG PO TABS: 75.0000 ug | ORAL_TABLET | Freq: Every day | ORAL | 1 refills | Status: DC

## 2024-02-27 NOTE — Therapy (Unsigned)
 OUTPATIENT OCCUPATIONAL THERAPY NEURO TREATMENT/PROGRESS  NOTE   Dates of reporting period  01/24/24   to   02/28/24   Patient Name: Lisa Williams MRN: [id number] DOB:22-Dec-1960, 63 y.o., female Today's Date: 02/28/2024  PCP: Dr. Layman Piety   Neurologist Dr. Suanne at [facility] REFERRING PROVIDER: Dr. Layman Piety    END OF SESSION:  OT End of Session - 02/28/24 1054     Visit Number 50    Number of Visits 67    Date for Recertification  04/29/24    Authorization Time Period Reporting period beginning 01/24/24    Progress Note Due on Visit 50    OT Start Time 1100    OT Stop Time 1145    OT Time Calculation (min) 45 min    Equipment Utilized During Treatment manual wc    Activity Tolerance Patient tolerated treatment well    Behavior During Therapy [facility] for tasks assessed/performed          Past Medical History:  Diagnosis Date   Abdominal pain, right upper quadrant    Back pain    Calculus of kidney 12/09/2013   Chronic back pain    unspecified   Chronic left shoulder pain 07/19/2015   Complication of anesthesia    Functional disorder of bladder    other   Galactorrhea 11/26/2014   Chronic    Hereditary and idiopathic neuropathy 08/19/2013   History of kidney stones    HPV test positive    Hypercholesteremia 08/19/2013   Hypertension    Incomplete bladder emptying    Microscopic hematuria    MS (multiple sclerosis)    Muscle spasticity 05/21/2014   Nonspecific findings on examination of urine    other   Osteopenia    PONV (postoperative nausea and vomiting)    Status post laparoscopic supracervical hysterectomy 11/26/2014   Tobacco user 11/26/2014   Wrist fracture    Past Surgical History:  Procedure Laterality Date   bilateral tubal ligation  1996   BREAST CYST EXCISION Left 2002   CYST EXCISION Left 05/10/2022   Procedure: CYST REMOVAL;  Surgeon: Rodolph Romano, MD;  Location: [facility] ORS;  Service: General;  Laterality: Left;    FRACTURE SURGERY     KNEE SURGERY     right   LAPAROSCOPIC SUPRACERVICAL HYSTERECTOMY  08/05/2013   ORIF WRIST FRACTURE Left 01/17/2017   Procedure: OPEN REDUCTION INTERNAL FIXATION (ORIF) WRIST FRACTURE;  Surgeon: Leora Lynwood SAUNDERS, MD;  Location: [facility] ORS;  Service: Orthopedics;  Laterality: Left;   RADIOLOGY WITH ANESTHESIA N/A 03/18/2020   Procedure: MRI WITH ANESTHESIA CERVICAL SPINE AND BRAIN  WITH AND WITHOUT CONTRAST;  Surgeon: Radiologist, Medication, MD;  Location: [facility] OR;  Service: Radiology;  Laterality: N/A;   TUBAL LIGATION Bilateral    VAGINAL HYSTERECTOMY  03/2006   Patient Active Problem List   Diagnosis Date Noted   Hyperglycemia 01/09/2024   Abnormal LFTs (liver function tests) 09/17/2023   Sinus tachycardia 07/07/2023   Aortic atherosclerosis 01/18/2023   Prediabetes 01/18/2023   Hypokalemia 07/29/2022   Weakness of both lower extremities 07/29/2022   Abscess of left groin 11/15/2021   Abnormal LFTs 11/15/2021   Acute respiratory disease due to COVID-19 virus 11/21/2020   Weakness    Hypoalbuminemia due to protein-calorie malnutrition    Neurogenic bowel    Neurogenic bladder    Labile blood pressure    Neuropathic pain    Multiple sclerosis, relapsing-remitting 07/09/2019   Abscess of female pelvis    SVT (  supraventricular tachycardia)    Radial styloid tenosynovitis 03/12/2018   Wheelchair dependence 02/27/2018   Localized osteoporosis with current pathological fracture with routine healing 01/19/2017   Wrist fracture 01/16/2017   Sprain of ankle 03/23/2016   Closed fracture of lateral malleolus 03/16/2016   Health care maintenance 01/24/2016   Blood pressure elevated without history of HTN 10/25/2015   Essential hypertension 10/25/2015   Multiple sclerosis 10/02/2015   Chronic left shoulder pain 07/19/2015   Multiple sclerosis exacerbation 07/14/2015   MS (multiple sclerosis) 11/26/2014   Increased body mass index 11/26/2014   HPV test positive  11/26/2014   Status post laparoscopic supracervical hysterectomy 11/26/2014   Galactorrhea 11/26/2014   Back ache 05/21/2014   Adiposity 05/21/2014   Disordered sleep 05/21/2014   Muscle spasticity 05/21/2014   Spasticity 05/21/2014   Calculus of kidney 12/09/2013   Renal colic 12/09/2013   Hypercholesteremia 08/19/2013   Hereditary and idiopathic neuropathy 08/19/2013   Hypercholesterolemia without hypertriglyceridemia 08/19/2013   Bladder infection, chronic 07/25/2012   Disorder of bladder function 07/25/2012   Incomplete bladder emptying 07/25/2012   Microscopic hematuria 07/25/2012   Right upper quadrant pain 07/25/2012   ONSET DATE: 5/24 Amon and broke R leg which caused beginning of ADL decline; diagnosed with MS in 1995)  REFERRING DIAG: MS  THERAPY DIAG:  Multiple sclerosis exacerbation  Other lack of coordination  Rationale for Evaluation and Treatment: Rehabilitation  SUBJECTIVE:  SUBJECTIVE STATEMENT: Pt reports fatigue from working hard at PT. Pt accompanied by: self  PERTINENT HISTORY: Pt reports increased difficulty with basic self care tasks since breaking her R leg last May.  Since that time, mobility and ADLs have declined, with pt requiring assist from private caregivers and family to manage bathing, toileting, dressing, and functional transfers.  PRECAUTIONS: Fall  WEIGHT BEARING RESTRICTIONS: No  PAIN: 02/21/24: No pain today  Are you having pain? No; occasional pain in R lower back, but not today  FALLS: Has patient fallen in last 6 months? No Last fall was May 17 of 2024  LIVING ENVIRONMENT: Lives with: lives with their family (including mother who has dementia and sister [name])  Lives in: 2 level home but pt resides on main level Stairs: ramp  Has following equipment at home: Wheelchair (manual), Shower bench, and hand held shower shower, 1 grab bar in the shower, 3in1 commode, sit to stand lift (electric), FWW, hospital bed  PLOF: modified  indep with ADL/IADLs prior to May of 7975   PATIENT GOALS: Increase independence with basic self care tasks  OBJECTIVE:  Note: Objective measures were completed at Evaluation unless otherwise noted.  HAND DOMINANCE: Right  ADLs:  Overall ADLs: assist provided from paid caregiver and sister Transfers/ambulation related to ADLs: sit to stand lift for all transfers, set up for sliding board in/out of bed  Eating: indep  Grooming: modified indep (wc level in mom's bathroom)  UB Dressing: set up (can't access her bedroom closet)  LB Dressing: able to sit on side of bed to don all LB clothing; assist to hike pants/underwear Toileting: assist with clothing management d/t standing within electric lift Bathing: bed bath with sister helping to wash backside Tub Shower transfers: N/A; pt has walk in shower in mom's bathroom (wc does fit in this bathroom but pt reports inability to transfer from wc and requires arm rests on a bench for a successful transfer attempt from any DME).  Tub bench is in pt's bathroom, but lift does not fit through bathroom doorway and  pt reports inability to transfer up from tub bench (currently unable to manage either transfer)   Equipment: see above  IADLs: Shopping: [employer] for shopping Light housekeeping: modified indep from wc level Meal Prep: modified indep from wc level Community mobility: relies on community transportation or family members Medication management: indep Landscape architect: indep Handwriting: NT; pt denies any FMC challenges  MOBILITY STATUS: Hx of falls  POSTURE COMMENTS:  Rounded shoulders, forward head, anterior pelvic tilt, and weight shift left   ACTIVITY TOLERANCE: Eval: Activity tolerance: Per PT; currently standing 30-60 sec within Light Gait harness/lift; currently non-ambulatory  01/24/24: Standing at sink: 1 minute; min A and 5 attempts to achieve fully erect standing position with BUEs supported on sink countertop, OT  assist to block R knee to prevent buckling   02/28/24: x4 attempts, achieves 75% upright standing with MIN A to block knees.   UPPER EXTREMITY ROM:  BUEs WFL  UPPER EXTREMITY MMT:     MMT Right eval Left eval  Shoulder flexion 4+ 4  Shoulder abduction 4+ 4  Shoulder adduction    Shoulder extension    Shoulder internal rotation 4+ 4+  Shoulder external rotation 4 4  Middle trapezius    Lower trapezius    Elbow flexion 4+ 4+  Elbow extension 4+ 4+  Wrist flexion 4+ 4+  Wrist extension 4+ 4+  Wrist ulnar deviation    Wrist radial deviation    Wrist pronation    Wrist supination    (Blank rows = not tested)  HAND FUNCTION/COORDINATION:  R/L WNL; pt denies any coordination deficits/ able to manipulate pills/clothing fasteners/etc without difficulty  11/15/23: *Measures taken d/t PT note revealing pt with increased difficulty manipulating smaller ADL supplies during reaching activities in PT session   11/15/23: Grip strength: R: 70 lbs, L: 63 lbs Pinch strength: Lateral: R: 16 lbs, L: 15 lbs; 3 point pinch: R: 12 lbs, L: 13 lbs 9 hole peg test: Right: 25 sec, L 49 sec   12/25/23: Grip strength: Right: 70 lbs; Left: 65 lbs  9 hole peg test: Right: 24 sec; Left: 3 trials: 54 sec, 1 min, 56 sec  01/24/24: Grip grip: Right: 76 lbs; Left: 67 lbs Lateral pinch: R: 17 lbs, L: 17 lbs; 3 point pinch: R: 13 lbs, L: 14 lbs  9 Hole Peg Test: R: 26 sec, L: 50 sec  02/28/24: Grip grip: Right: 85 lbs; Left: 71 lbs Lateral pinch: R: 20 lbs, L: 19 lbs; 3 point pinch: R: 17 lbs, L: 18 lbs  9 Hole Peg Test: R: 29 sec, L: 40 sec  SENSATION: WFL  EDEMA: No visible edema in BUEs  MUSCLE TONE: BUEs WNL  COGNITION: Overall cognitive status: Within functional limits for tasks assessed  VISION: wears glasses all the time, no reports of diplopia  PERCEPTION: Not tested  PRAXIS: WFL  OBSERVATIONS:  Pt pleasant, cooperative, and motivated to work towards improving indep with ADLs.  TREATMENT DATE: 02/28/24 Self Care -Sit to stand trials at sink x4 in prep for ADL transfers.  2nd wc cushion placed beneath pt to ease STS attempts; min A to block B knees, achieves 75% of upright posture. Unable to achieve standing 2/2 fatigue from PT.  Therapeutic Activity: -Facilitated L/R FMC/dexterity skills picking 1/8 inch hollow beads using tweezers to place on pegboard. Manipulated 1/8 beads in hand to flip and place on pegboard. Then focused on storing up to 8 beads in hand and returning to tupperware.   PATIENT EDUCATION: Education details: STS transfer technique Person educated: Patient Education method: Explanation, Demonstration, Tactile cues, and Verbal cues Education comprehension: verbalized understanding and needs further education  HOME EXERCISE PROGRAM: IR A/AAROM to promote shoulder flexibility for peri care/bathing  GOALS: Goals reviewed with patient? Yes  SHORT TERM GOALS: Target date: 12/25/23  Pt will perform bed bath with set up only. Baseline: Eval: Min-mod A for posterior washing; 09/28/23: Min A for posterior washing; 11/13/23: Able to manage bed bath but sister helps with thoroughness, per pt's preference; pt in agreement to trial bath sitting EOB or in wc before next OT session. Goal status: achieved/d/c  2.  Pt will utilize sliding board for wc<>drop arm commode transfer with SBA. Baseline: Eval: Currently using electric lift for transfer to [facility]; 09/28/23: Not yet completed at home but transfers to commode have been easier without sliding board d/t pt implementing a scoot pivot. Goal status: d/c (pt does not prefer sliding board for commode transfer)  3.  Pt will be indep to perform HEP for maintaining BUE strength for ADLs and functional transfers. Baseline: Eval: HEP not yet initiated; 09/28/23: Pt is working on core stability exercises from wc, cane stretches for  bilat shoulder IR, and tricep extension via wc pushups; 11/13/23: indep Goal status: achieved  LONG TERM GOALS: Target date: 02/05/24  Pt will perform sink bath with set up A. (Revised on 11/13/23 from min A to set up) Baseline: Eval: Currently bed bathing d/t inability to stand at sink without electric lift (lift does not fit into pt's bathroom); 09/28/23: still completing bed bath as pt is not yet able to stand; 11/13/23: Not yet attempted; pt encouraged to attempt before next OT session now that bed bath goal has been met; 12/20/23: Pt is now completing sponge bath with min A while seated EOB (caregiver assists with washing peri area d/t no bed rail to aid in R lateral lean); 01/24/24: Performing with set up on EOB, completing lateral weight shifts to wash posterior/peri area with wc in front of pt and bed rail for LUE support and foot of bed for RUE support  Goal status: achieved  2.  Pt will perform wc<>tub bench transfer with min A. Baseline: Eval: Unable; 09/28/23: Performed in OT clinic with min A, but not yet in the home Goal status: d/c (pt prefers sink bath)  3.  Pt will perform squat pivot transfer wc<>BSC with SBA.  Baseline: Eval: Eval: Currently using electric lift for transfer to [facility]; 09/28/23: Not yet completed in clinic with [facility], and using lift for [facility] in the home still; 11/13/23: Still using Camie lift at home and in clinic transfers are all scoot rather than squat pivots d/t LE weakness; 12/20/23: Pt reports that she plans to use her Camie lift until she can ambulate to her [facility] d/t limited space for wc in her desired location for [facility] at home. Goal status: d/c  4. Pt will perform scoot transfer wc<>toilet  using grab bars with supv to allow toileting in community setting.  Baseline: Eval: Pt limits community outings d/t requiring use of lift for [facility] transfers; 09/28/23: Pt has performed 2 successful scoot pivot  Transfers to toilet in OT clinic with min A, extra time.  Further trials with  clothing management on toilet needed, but pt can lower and hike  pants while sititng edge of mat via lateral leaning and min A to maintain core stability; 11/13/23: Pt performs with CGA and extra time; 12/19/23: Pt  performs with close supv on most attempts, occasional CGA; efficiency is improving; 01/24/24: ongoing improvements; able to complete in fewer scoots (3 scoots each way from wc<>toilet) d/t improve engagement of Les and improved anterior weight shift; occasional min guard-supv; pt completes with therapist in clinic, but not yet in other community settings with sister, and not yet voided on toilet in community. 02/28/24: has not trialed in community, completes with SBA    Goal status: ongoing  5.  Pt will complete seated clothing management with min A to enable voiding in handicapped stall within community. Baseline: Recert 11/13/23: Pt can transfer to toilet with increased time and effort, but has not yet attempted clothing management or voiding while seated on commode in community setting.  Pt can lower pants to knees while seated in wc, but requires mod A to hike over hips from seated position; 12/20/23: Pt fully lowers panties and shorts while seated on commode with supv, can hike clothing to her her upper thighs, transfers back to wc, and then performs a tricep extension from wc level to allow caregiver to hike clothing remainder of the way for purposes of EC (min A for clothing management component) Goal status: achieved  6.  Pt will tolerate standing x1 min with close supv and BUE support to manage clothing in prep for toileting. Baseline: Eval: Currently standing in electric lift only, or within parallel bars without lift; 09/28/23: Not yet attempted; 11/13/23: Pt is able to perform static standing with heavy BUE support on parallel bars with PT; 12/19/22: Not attempted recently d/t new intermittent clonus in BLEs; 01/24/24: Tolerated standing 1 min today at sink with min A for ascent, OT assist to  block R knee from buckling. 02/28/24: tolerates up to 1 min standing, difficulty achieving stand 2/2 fatigue from PT.  Goal status: in progress  7.  Pt will increase bilat grip strength in order to securely grasp walker sufficiently for functional transfers and ambulation.  Baseline: Recert 11/13/23: TBD (PT note read following OT session, indicating pt with difficulty securing items in hand);  11/15/23: Grip strength: R: 70 lbs, L: 63 lbs; 12/25/23: R 76 lbs, L 67 lbs; sufficient for functional transfers/amb  Goal status: achieved  8.  Pt will increase [facility] skills in order to manipulate small ADL supplies with reduced dropping as indicated by 20 sec or more improvement in 9 hole peg test. Baseline: Recert 11/13/23: 9 hole peg test TBD (PT note read following OT session, indicating pt with difficulty manipulating and securing items in hands); 11/15/23: Right: 25 sec, L 49 sec; 12/25/23: Right: 24 sec; Left: 3 trials: 54 sec, 1 min, 56 sec; 01/24/24: R: 26 sec, L: 50 sec. 02/28/24: L: 40 sec.  Goal status: in progress  ASSESSMENT: CLINICAL IMPRESSION: Measurements obtained and goals reviewed with great improvements noted in B grip strength, B pinch strength, and L coordination. Continues to be limited in standing tolerance 2/2 fatigue from PT sessions. B knee block for  x4 STS trials. Pt will continue to benefit from skilled OT to work towards above noted goals in OT poc, working to maximize indep with daily tasks while reducing burden of care on caregivers.   PERFORMANCE DEFICITS: in functional skills including ADLs, IADLs, strength, pain, flexibility, Gross motor control, mobility, balance, body mechanics, endurance, and decreased knowledge of use of DME, and psychosocial skills including coping strategies, environmental adaptation, habits, and routines and behaviors.   IMPAIRMENTS: are limiting patient from ADLs, IADLs, and social participation.   CO-MORBIDITIES: has co-morbidities such as neuropathy,  back pain, obesity, MS, HTN that affects occupational performance. Patient will benefit from skilled OT to address above impairments and improve overall function.  MODIFICATION OR ASSISTANCE TO COMPLETE EVALUATION: No modification of tasks or assist necessary to complete an evaluation.  OT OCCUPATIONAL PROFILE AND HISTORY: Detailed assessment: Review of records and additional review of physical, cognitive, psychosocial history related to current functional performance.  CLINICAL DECISION MAKING: Moderate - several treatment options, min-mod task modification necessary  REHAB POTENTIAL: Good  EVALUATION COMPLEXITY: Moderate  PLAN:  OT FREQUENCY: 2x/week  OT DURATION: 12 weeks  PLANNED INTERVENTIONS: 97168 OT Re-evaluation, 97535 self care/ADL training, 02889 therapeutic exercise, 97530 therapeutic activity, 97112 neuromuscular re-education, 97140 manual therapy, 97116 gait training, 02989 moist heat, 97010 cryotherapy, balance training, functional mobility training, psychosocial skills training, energy conservation, coping strategies training, patient/family education, and DME and/or AE instructions  RECOMMENDED OTHER SERVICES: None at this time (Pt currently receiving PT services in this clinic)  CONSULTED AND AGREED WITH PLAN OF CARE: Patient  PLAN FOR NEXT SESSION: see above  Elston Slot, M.S. OTR/L  02/28/24, 10:54 AM  ascom [phone]   Elston JINNY Slot, OT 02/28/2024, 10:54 AM

## 2024-02-27 NOTE — Therapy (Signed)
 OUTPATIENT OCCUPATIONAL THERAPY NEURO TREATMENT NOTE  Patient Name: Lisa Williams MRN: [id number] DOB:[birth year], 63 y.o., female Today's Date: 02/27/2024  PCP: Dr. Layman Piety   Neurologist Dr. Suanne at [facility] REFERRING PROVIDER: Dr. Layman Piety    END OF SESSION:  OT End of Session - 02/27/24 1509     Visit Number 49    Number of Visits 67    Date for Recertification  04/29/24    Authorization Time Period Reporting period beginning 01/24/24    Progress Note Due on Visit 50    OT Start Time 1100    OT Stop Time 1140    OT Time Calculation (min) 40 min    Equipment Utilized During Treatment manual wc    Activity Tolerance Patient tolerated treatment well    Behavior During Therapy [facility] for tasks assessed/performed         Past Medical History:  Diagnosis Date   Abdominal pain, right upper quadrant    Back pain    Calculus of kidney 12/09/2013   Chronic back pain    unspecified   Chronic left shoulder pain 07/19/2015   Complication of anesthesia    Functional disorder of bladder    other   Galactorrhea 11/26/2014   Chronic    Hereditary and idiopathic neuropathy 08/19/2013   History of kidney stones    HPV test positive    Hypercholesteremia 08/19/2013   Hypertension    Incomplete bladder emptying    Microscopic hematuria    MS (multiple sclerosis)    Muscle spasticity 05/21/2014   Nonspecific findings on examination of urine    other   Osteopenia    PONV (postoperative nausea and vomiting)    Status post laparoscopic supracervical hysterectomy 11/26/2014   Tobacco user 11/26/2014   Wrist fracture    Past Surgical History:  Procedure Laterality Date   bilateral tubal ligation  1996   BREAST CYST EXCISION Left 2002   CYST EXCISION Left 05/10/2022   Procedure: CYST REMOVAL;  Surgeon: Rodolph Romano, MD;  Location: [facility] ORS;  Service: General;  Laterality: Left;   FRACTURE SURGERY     KNEE SURGERY     right   LAPAROSCOPIC  SUPRACERVICAL HYSTERECTOMY  08/05/2013   ORIF WRIST FRACTURE Left 01/17/2017   Procedure: OPEN REDUCTION INTERNAL FIXATION (ORIF) WRIST FRACTURE;  Surgeon: Leora Lynwood SAUNDERS, MD;  Location: [facility] ORS;  Service: Orthopedics;  Laterality: Left;   RADIOLOGY WITH ANESTHESIA N/A 03/18/2020   Procedure: MRI WITH ANESTHESIA CERVICAL SPINE AND BRAIN  WITH AND WITHOUT CONTRAST;  Surgeon: Radiologist, Medication, MD;  Location: [facility] OR;  Service: Radiology;  Laterality: N/A;   TUBAL LIGATION Bilateral    VAGINAL HYSTERECTOMY  03/2006   Patient Active Problem List   Diagnosis Date Noted   Hyperglycemia 01/09/2024   Abnormal LFTs (liver function tests) 09/17/2023   Sinus tachycardia 07/07/2023   Aortic atherosclerosis 01/18/2023   Prediabetes 01/18/2023   Hypokalemia 07/29/2022   Weakness of both lower extremities 07/29/2022   Abscess of left groin 11/15/2021   Abnormal LFTs 11/15/2021   Acute respiratory disease due to COVID-19 virus 11/21/2020   Weakness    Hypoalbuminemia due to protein-calorie malnutrition    Neurogenic bowel    Neurogenic bladder    Labile blood pressure    Neuropathic pain    Multiple sclerosis, relapsing-remitting 07/09/2019   Abscess of female pelvis    SVT (supraventricular tachycardia)    Radial styloid tenosynovitis 03/12/2018   Wheelchair dependence 02/27/2018   Localized  osteoporosis with current pathological fracture with routine healing 01/19/2017   Wrist fracture 01/16/2017   Sprain of ankle 03/23/2016   Closed fracture of lateral malleolus 03/16/2016   Health care maintenance 01/24/2016   Blood pressure elevated without history of HTN 10/25/2015   Essential hypertension 10/25/2015   Multiple sclerosis 10/02/2015   Chronic left shoulder pain 07/19/2015   Multiple sclerosis exacerbation 07/14/2015   MS (multiple sclerosis) 11/26/2014   Increased body mass index 11/26/2014   HPV test positive 11/26/2014   Status post laparoscopic supracervical hysterectomy  11/26/2014   Galactorrhea 11/26/2014   Back ache 05/21/2014   Adiposity 05/21/2014   Disordered sleep 05/21/2014   Muscle spasticity 05/21/2014   Spasticity 05/21/2014   Calculus of kidney 12/09/2013   Renal colic 12/09/2013   Hypercholesteremia 08/19/2013   Hereditary and idiopathic neuropathy 08/19/2013   Hypercholesterolemia without hypertriglyceridemia 08/19/2013   Bladder infection, chronic 07/25/2012   Disorder of bladder function 07/25/2012   Incomplete bladder emptying 07/25/2012   Microscopic hematuria 07/25/2012   Right upper quadrant pain 07/25/2012   ONSET DATE: 5/24 Amon and broke R leg which caused beginning of ADL decline; diagnosed with MS in 1995)  REFERRING DIAG: MS  THERAPY DIAG:  Muscle weakness (generalized)  Multiple sclerosis exacerbation  Other lack of coordination  Rationale for Evaluation and Treatment: Rehabilitation  SUBJECTIVE:  SUBJECTIVE STATEMENT: Pt reports wanting to continue to focus on STS/standing to work towards her goal of standing at the sink for sink bathing. Pt accompanied by: self, pt's mother  PERTINENT HISTORY: Pt reports increased difficulty with basic self care tasks since breaking her R leg last May.  Since that time, mobility and ADLs have declined, with pt requiring assist from private caregivers and family to manage bathing, toileting, dressing, and functional transfers.  PRECAUTIONS: Fall  WEIGHT BEARING RESTRICTIONS: No  PAIN: 02/26/24: No pain today  Are you having pain? No; occasional pain in R lower back, but not today  FALLS: Has patient fallen in last 6 months? No Last fall was May 17 of 2024  LIVING ENVIRONMENT: Lives with: lives with their family (including mother who has dementia and sister [name])  Lives in: 2 level home but pt resides on main level Stairs: ramp  Has following equipment at home: Wheelchair (manual), Shower bench, and hand held shower shower, 1 grab bar in the shower, 3in1 commode, sit to  stand lift (electric), FWW, hospital bed  PLOF: modified indep with ADL/IADLs prior to May of 7975   PATIENT GOALS: Increase independence with basic self care tasks  OBJECTIVE:  Note: Objective measures were completed at Evaluation unless otherwise noted.  HAND DOMINANCE: Right  ADLs:  Overall ADLs: assist provided from paid caregiver and sister Transfers/ambulation related to ADLs: sit to stand lift for all transfers, set up for sliding board in/out of bed  Eating: indep  Grooming: modified indep (wc level in mom's bathroom)  UB Dressing: set up (can't access her bedroom closet)  LB Dressing: able to sit on side of bed to don all LB clothing; assist to hike pants/underwear Toileting: assist with clothing management d/t standing within electric lift Bathing: bed bath with sister helping to wash backside Tub Shower transfers: N/A; pt has walk in shower in mom's bathroom (wc does fit in this bathroom but pt reports inability to transfer from wc and requires arm rests on a bench for a successful transfer attempt from any DME).  Tub bench is in pt's bathroom, but lift does not fit through  bathroom doorway and pt reports inability to transfer up from tub bench (currently unable to manage either transfer)   Equipment: see above  IADLs: Shopping: [employer] for shopping Light housekeeping: modified indep from wc level Meal Prep: modified indep from wc level Community mobility: relies on community transportation or family members Medication management: indep Landscape architect: indep Handwriting: NT; pt denies any FMC challenges  MOBILITY STATUS: Hx of falls  POSTURE COMMENTS:  Rounded shoulders, forward head, anterior pelvic tilt, and weight shift left   ACTIVITY TOLERANCE: Eval: Activity tolerance: Per PT; currently standing 30-60 sec within Light Gait harness/lift; currently non-ambulatory  01/24/24: Standing at sink: 1 minute; min A and 5 attempts to achieve fully erect  standing position with BUEs supported on sink countertop, OT assist to block R knee to prevent buckling   UPPER EXTREMITY ROM:  BUEs WFL  UPPER EXTREMITY MMT:     MMT Right eval Left eval  Shoulder flexion 4+ 4  Shoulder abduction 4+ 4  Shoulder adduction    Shoulder extension    Shoulder internal rotation 4+ 4+  Shoulder external rotation 4 4  Middle trapezius    Lower trapezius    Elbow flexion 4+ 4+  Elbow extension 4+ 4+  Wrist flexion 4+ 4+  Wrist extension 4+ 4+  Wrist ulnar deviation    Wrist radial deviation    Wrist pronation    Wrist supination    (Blank rows = not tested)  HAND FUNCTION/COORDINATION:  R/L WNL; pt denies any coordination deficits/ able to manipulate pills/clothing fasteners/etc without difficulty  11/15/23: *Measures taken d/t PT note revealing pt with increased difficulty manipulating smaller ADL supplies during reaching activities in PT session   11/15/23: Grip strength: R: 70 lbs, L: 63 lbs Pinch strength: Lateral: R: 16 lbs, L: 15 lbs; 3 point pinch: R: 12 lbs, L: 13 lbs 9 hole peg test: Right: 25 sec, L 49 sec   12/25/23: Grip strength: Right: 70 lbs; Left: 65 lbs  9 hole peg test: Right: 24 sec; Left: 3 trials: 54 sec, 1 min, 56 sec  01/24/24: Grip grip: Right: 76 lbs; Left: 67 lbs Lateral pinch: R: 17 lbs, L: 17 lbs; 3 point pinch: R: 13 lbs, L: 14 lbs  9 Hole Peg Test: R: 26 sec, L: 50 sec  SENSATION: WFL  EDEMA: No visible edema in BUEs  MUSCLE TONE: BUEs WNL  COGNITION: Overall cognitive status: Within functional limits for tasks assessed  VISION: wears glasses all the time, no reports of diplopia  PERCEPTION: Not tested  PRAXIS: WFL  OBSERVATIONS:  Pt pleasant, cooperative, and motivated to work towards improving indep with ADLs.                                                                                                     TREATMENT DATE: 02/26/24 Self Care -Sit to stand x10 trials from elevated mat table (22.5)  using RW; OT provided manual assist to block knees, and min vc for forward WS.  Therapeutic Activity: -Facilitated BLE strengthening for functional transfers and ADLs: ball kick with  pt sitting on edge of elevated mat table   PATIENT EDUCATION: Education details: STS transfer technique Person educated: Patient Education method: Explanation, Demonstration, Tactile cues, and Verbal cues Education comprehension: verbalized understanding, returned demonstration, and needs further education  HOME EXERCISE PROGRAM: IR A/AAROM to promote shoulder flexibility for peri care/bathing  GOALS: Goals reviewed with patient? Yes  SHORT TERM GOALS: Target date: 12/25/23  Pt will perform bed bath with set up only. Baseline: Eval: Min-mod A for posterior washing; 09/28/23: Min A for posterior washing; 11/13/23: Able to manage bed bath but sister helps with thoroughness, per pt's preference; pt in agreement to trial bath sitting EOB or in wc before next OT session. Goal status: achieved/d/c  2.  Pt will utilize sliding board for wc<>drop arm commode transfer with SBA. Baseline: Eval: Currently using electric lift for transfer to [facility]; 09/28/23: Not yet completed at home but transfers to commode have been easier without sliding board d/t pt implementing a scoot pivot. Goal status: d/c (pt does not prefer sliding board for commode transfer)  3.  Pt will be indep to perform HEP for maintaining BUE strength for ADLs and functional transfers. Baseline: Eval: HEP not yet initiated; 09/28/23: Pt is working on core stability exercises from wc, cane stretches for bilat shoulder IR, and tricep extension via wc pushups; 11/13/23: indep Goal status: achieved  LONG TERM GOALS: Target date: 02/05/24  Pt will perform sink bath with set up A. (Revised on 11/13/23 from min A to set up) Baseline: Eval: Currently bed bathing d/t inability to stand at sink without electric lift (lift does not fit into pt's bathroom); 09/28/23: still  completing bed bath as pt is not yet able to stand; 11/13/23: Not yet attempted; pt encouraged to attempt before next OT session now that bed bath goal has been met; 12/20/23: Pt is now completing sponge bath with min A while seated EOB (caregiver assists with washing peri area d/t no bed rail to aid in R lateral lean); 01/24/24: Performing with set up on EOB, completing lateral weight shifts to wash posterior/peri area with wc in front of pt and bed rail for LUE support and foot of bed for RUE support  Goal status: achieved  2.  Pt will perform wc<>tub bench transfer with min A. Baseline: Eval: Unable; 09/28/23: Performed in OT clinic with min A, but not yet in the home Goal status: d/c (pt prefers sink bath)  3.  Pt will perform squat pivot transfer wc<>BSC with SBA.  Baseline: Eval: Eval: Currently using electric lift for transfer to [facility]; 09/28/23: Not yet completed in clinic with [facility], and using lift for [facility] in the home still; 11/13/23: Still using Camie lift at home and in clinic transfers are all scoot rather than squat pivots d/t LE weakness; 12/20/23: Pt reports that she plans to use her Camie lift until she can ambulate to her [facility] d/t limited space for wc in her desired location for [facility] at home. Goal status: d/c  4. Pt will perform scoot transfer wc<>toilet using grab bars with supv to allow toileting in community setting.  Baseline: Eval: Pt limits community outings d/t requiring use of lift for [facility] transfers; 09/28/23: Pt has performed 2 successful scoot pivot  Transfers to toilet in OT clinic with min A, extra time.  Further trials with clothing management on toilet needed, but pt can lower and hike  pants while sititng edge of mat via lateral leaning and min A to maintain core stability; 11/13/23: Pt  performs with CGA and extra time; 12/19/23: Pt  performs with close supv on most attempts, occasional CGA; efficiency is improving; 01/24/24: ongoing improvements; able to complete in fewer scoots (3  scoots each way from wc<>toilet) d/t improved engagement of Les and improved anterior weight shift; occasional min guard-supv; pt completes with therapist in clinic, but not yet in other community settings with sister, and not yet voided on toilet in community    Goal status: ongoing  5.  Pt will complete seated clothing management with min A to enable voiding in handicapped stall within community. Baseline: Recert 11/13/23: Pt can transfer to toilet with increased time and effort, but has not yet attempted clothing management or voiding while seated on commode in community setting.  Pt can lower pants to knees while seated in wc, but requires mod A to hike over hips from seated position; 12/20/23: Pt fully lowers panties and shorts while seated on commode with supv, can hike clothing to her her upper thighs, transfers back to wc, and then performs a tricep extension from wc level to allow caregiver to hike clothing remainder of the way for purposes of EC (min A for clothing management component) Goal status: achieved  6.  Pt will tolerate standing x1 min with close supv and BUE support to manage clothing in prep for toileting and bathing peri area at the sink. Baseline: Eval: Currently standing in electric lift only, or within parallel bars without lift; 09/28/23: Not yet attempted; 11/13/23: Pt is able to perform static standing with heavy BUE support on parallel bars with PT; 12/19/22: Not attempted recently d/t new intermittent clonus in BLEs; 01/24/24: Tolerated standing 1 min today at sink with min A for ascent, OT assist to block R knee from buckling Goal status: in progress  7.  Pt will increase bilat grip strength in order to securely grasp walker sufficiently for functional transfers and ambulation.  Baseline: Recert 11/13/23: TBD (PT note read following OT session, indicating pt with difficulty securing items in hand);  11/15/23: Grip strength: R: 70 lbs, L: 63 lbs; 12/25/23: R 76 lbs, L 67 lbs;  sufficient for functional transfers/amb  Goal status: achieved  8.  Pt will increase L [facility] skills in order to manipulate small ADL supplies with reduced dropping as indicated by 20 sec or more improvement in 9 hole peg test. Baseline: Recert 11/13/23: 9 hole peg test TBD (PT note read following OT session, indicating pt with difficulty manipulating and securing items in hands); 11/15/23: Right: 25 sec, L 49 sec; 12/25/23: Right: 24 sec; Left: 3 trials: 54 sec, 1 min, 56 sec; 01/24/24: R: 26 sec, L: 50 sec  Goal status: in progress  ASSESSMENT: CLINICAL IMPRESSION: Pt requests continued focus on STS/standing trials d/t eagerness to work towards standing at sink for bathing peri area.  Pt practiced STS trials from elevated surface today at 22.5 mat table height.  Not yet able to achieve full standing position from edge of mat table, but able to achieve ~75% of full standing position on ~50% of trials.  Pt continues to sponge bathe from edge of bed at home where she is able to complete lateral leaning to manage peri care.  Pt will continue to benefit from skilled OT to work towards above noted goals in OT poc, working to maximize indep with daily tasks while reducing burden of care on caregivers.   PERFORMANCE DEFICITS: in functional skills including ADLs, IADLs, strength, pain, flexibility, Gross motor control, mobility, balance, body mechanics,  endurance, and decreased knowledge of use of DME, and psychosocial skills including coping strategies, environmental adaptation, habits, and routines and behaviors.   IMPAIRMENTS: are limiting patient from ADLs, IADLs, and social participation.   CO-MORBIDITIES: has co-morbidities such as neuropathy, back pain, obesity, MS, HTN that affects occupational performance. Patient will benefit from skilled OT to address above impairments and improve overall function.  MODIFICATION OR ASSISTANCE TO COMPLETE EVALUATION: No modification of tasks or assist necessary to  complete an evaluation.  OT OCCUPATIONAL PROFILE AND HISTORY: Detailed assessment: Review of records and additional review of physical, cognitive, psychosocial history related to current functional performance.  CLINICAL DECISION MAKING: Moderate - several treatment options, min-mod task modification necessary  REHAB POTENTIAL: Good  EVALUATION COMPLEXITY: Moderate  PLAN:  OT FREQUENCY: 2x/week  OT DURATION: 12 weeks  PLANNED INTERVENTIONS: 97168 OT Re-evaluation, 97535 self care/ADL training, 02889 therapeutic exercise, 97530 therapeutic activity, 97112 neuromuscular re-education, 97140 manual therapy, 97116 gait training, 02989 moist heat, 97010 cryotherapy, balance training, functional mobility training, psychosocial skills training, energy conservation, coping strategies training, patient/family education, and DME and/or AE instructions  RECOMMENDED OTHER SERVICES: None at this time (Pt currently receiving PT services in this clinic)  CONSULTED AND AGREED WITH PLAN OF CARE: Patient  PLAN FOR NEXT SESSION: see above  Inocente Blazing, MS, OTR/L  Inocente MARLA Blazing, OT 02/27/2024, 3:10 PM

## 2024-02-27 NOTE — Telephone Encounter (Signed)
 I have sent in the prescription for synthroid  75mcg q day to [facility]. I have also place an order for TSH to be checked at hospital. Please notify her I am ok if she has tsh drawn at the hospital.

## 2024-02-28 ENCOUNTER — Ambulatory Visit: Payer: PPO

## 2024-02-28 ENCOUNTER — Ambulatory Visit

## 2024-02-28 DIAGNOSIS — R262 Difficulty in walking, not elsewhere classified: Secondary | ICD-10-CM

## 2024-02-28 DIAGNOSIS — G35D Multiple sclerosis, unspecified: Secondary | ICD-10-CM

## 2024-02-28 DIAGNOSIS — R269 Unspecified abnormalities of gait and mobility: Secondary | ICD-10-CM

## 2024-02-28 DIAGNOSIS — R2681 Unsteadiness on feet: Secondary | ICD-10-CM

## 2024-02-28 DIAGNOSIS — R2689 Other abnormalities of gait and mobility: Secondary | ICD-10-CM

## 2024-02-28 DIAGNOSIS — M6281 Muscle weakness (generalized): Secondary | ICD-10-CM

## 2024-02-28 DIAGNOSIS — R278 Other lack of coordination: Secondary | ICD-10-CM

## 2024-02-28 NOTE — Therapy (Signed)
 OUTPATIENT PHYSICAL THERAPY NEURO TREATMENT   Patient Name: Docia Klar Boutwell MRN: [id number] DOB:[birth year], 63 y.o., female Today's Date: 02/28/2024  PCP: Lenon Na  REFERRING PROVIDER: Lenon Na   END OF SESSION:  PT End of Session - 02/28/24 1434     Visit Number 64    Number of Visits 70    Date for Recertification  03/20/24   corrected   Progress Note Due on Visit 50    PT Start Time 1016    PT Stop Time 1100    PT Time Calculation (min) 44 min    Equipment Utilized During Treatment Gait belt    Activity Tolerance Patient tolerated treatment well;No increased pain    Behavior During Therapy [facility] for tasks assessed/performed            Past Medical History:  Diagnosis Date   Abdominal pain, right upper quadrant    Back pain    Calculus of kidney 12/09/2013   Chronic back pain    unspecified   Chronic left shoulder pain 07/19/2015   Complication of anesthesia    Functional disorder of bladder    other   Galactorrhea 11/26/2014   Chronic    Hereditary and idiopathic neuropathy 08/19/2013   History of kidney stones    HPV test positive    Hypercholesteremia 08/19/2013   Hypertension    Incomplete bladder emptying    Microscopic hematuria    MS (multiple sclerosis)    Muscle spasticity 05/21/2014   Nonspecific findings on examination of urine    other   Osteopenia    PONV (postoperative nausea and vomiting)    Status post laparoscopic supracervical hysterectomy 11/26/2014   Tobacco user 11/26/2014   Wrist fracture    Past Surgical History:  Procedure Laterality Date   bilateral tubal ligation  1996   BREAST CYST EXCISION Left 2002   CYST EXCISION Left 05/10/2022   Procedure: CYST REMOVAL;  Surgeon: Rodolph Romano, MD;  Location: [facility] ORS;  Service: General;  Laterality: Left;   FRACTURE SURGERY     KNEE SURGERY     right   LAPAROSCOPIC SUPRACERVICAL HYSTERECTOMY  08/05/2013   ORIF WRIST FRACTURE Left 01/17/2017    Procedure: OPEN REDUCTION INTERNAL FIXATION (ORIF) WRIST FRACTURE;  Surgeon: Leora Lynwood SAUNDERS, MD;  Location: [facility] OR[facility];  Service: Orthopedics;  Laterality: Left;   RADIOLOGY WITH ANESTHESIA N/A 03/18/2020   Procedure: MRI WITH ANESTHESIA CERVICAL SPINE AND BRAIN  WITH AND WITHOUT CONTRAST;  Surgeon: Radiologist, Medication, MD;  Location: [facility] OR;  Service: Radiology;  Laterality: N/A;   TUBAL LIGATION Bilateral    VAGINAL HYSTERECTOMY  03/2006   Patient Active Problem List   Diagnosis Date Noted   Hyperglycemia 01/09/2024   Abnormal LFTs (liver function tests) 09/17/2023   Sinus tachycardia 07/07/2023   Aortic atherosclerosis 01/18/2023   Prediabetes 01/18/2023   Hypokalemia 07/29/2022   Weakness of both lower extremities 07/29/2022   Abscess of left groin 11/15/2021   Abnormal LFTs 11/15/2021   Acute respiratory disease due to COVID-19 virus 11/21/2020   Weakness    Hypoalbuminemia due to protein-calorie malnutrition    Neurogenic bowel    Neurogenic bladder    Labile blood pressure    Neuropathic pain    Multiple sclerosis, relapsing-remitting 07/09/2019   Abscess of female pelvis    SVT (supraventricular tachycardia)    Radial styloid tenosynovitis 03/12/2018   Wheelchair dependence 02/27/2018   Localized osteoporosis with current pathological fracture with routine healing 01/19/2017   Wrist  fracture 01/16/2017   Sprain of ankle 03/23/2016   Closed fracture of lateral malleolus 03/16/2016   Health care maintenance 01/24/2016   Blood pressure elevated without history of HTN 10/25/2015   Essential hypertension 10/25/2015   Multiple sclerosis 10/02/2015   Chronic left shoulder pain 07/19/2015   Multiple sclerosis exacerbation 07/14/2015   MS (multiple sclerosis) 11/26/2014   Increased body mass index 11/26/2014   HPV test positive 11/26/2014   Status post laparoscopic supracervical hysterectomy 11/26/2014   Galactorrhea 11/26/2014   Back ache 05/21/2014   Adiposity  05/21/2014   Disordered sleep 05/21/2014   Muscle spasticity 05/21/2014   Spasticity 05/21/2014   Calculus of kidney 12/09/2013   Renal colic 12/09/2013   Hypercholesteremia 08/19/2013   Hereditary and idiopathic neuropathy 08/19/2013   Hypercholesterolemia without hypertriglyceridemia 08/19/2013   Bladder infection, chronic 07/25/2012   Disorder of bladder function 07/25/2012   Incomplete bladder emptying 07/25/2012   Microscopic hematuria 07/25/2012   Right upper quadrant pain 07/25/2012    ONSET DATE: 1995  REFERRING DIAG: MS  THERAPY DIAG:  Muscle weakness (generalized)  Multiple sclerosis exacerbation  Other lack of coordination  Difficulty in walking, not elsewhere classified  Unsteadiness on feet  Abnormality of gait and mobility  Other abnormalities of gait and mobility  Rationale for Evaluation and Treatment: Rehabilitation  SUBJECTIVE:                                                                                                                                                                                             SUBJECTIVE STATEMENT:  Pt reports she is doing well today.  Pt felt fatigued after the last attempt with ambulation on the LiteGait when she got home.  Pt accompanied by: self  PERTINENT HISTORY:  Patient is returning to PT s/p hospitalization.  s/p ORIF of R tibia shaft fracture 09/23/2022. Patient has weakness in BLE with RLE>LLE. She drives with hand controls. Patient has been diagnosed with MS in 1995. PMH includes: back pain, CBP, chronic L shoulder pain, galactorrhea, neuropathy, HPV, hypercholestermeia, HTN, MS, osteopenia, PONV, wrist fracture. Additional order for other closed fracture of proximal end of R tibia with routine healing. Still has to use [employer].   PAIN:  Are you having pain? Occasional pain in RLE; primarily in knee  PRECAUTIONS: Fall  RED FLAGS: None   WEIGHT BEARING RESTRICTIONS: No  FALLS: Has patient fallen  in last 6 months? No  LIVING ENVIRONMENT: Lives with: lives with their family Lives in: House/apartment Stairs: ramp Has following equipment at home: Vannie - 2 wheeled, Wheelchair (manual), shower chair, and Grab bars  PLOF: Independent  with household mobility with device  PATIENT GOALS: to get her independence back. To be able to get into car, toilet, and get dressed independently  OBJECTIVE:  Note: Objective measures were completed at Evaluation unless otherwise noted.  DIAGNOSTIC FINDINGS: MRI of the brain 03/18/2020 showed multiple T2/FLAIR hyperintense foci in the periventricular, juxtacortical and deep white matter.  There were no infratentorial lesions noted.  None of the foci enhanced.   Due to severe claustrophobia, the study was done with conscious sedation in the hospital   COGNITION: Overall cognitive status: Within functional limits for tasks assessed   SENSATION: Lack of sensation in feet, loss in bilateral lateral aspect of knee  COORDINATION: Does not have the strength for functional LE heel slide test   MUSCLE TONE: BLE mild tone    POSTURE: rounded shoulders, forward head, anterior pelvic tilt, and weight shift left   LOWER EXTREMITY MMT:    MMT Right Eval Left Eval  Hip flexion 0.9 1.5  Hip extension    Hip abduction 1.8 2.6  Hip adduction 3.1 1.9  Hip internal rotation    Hip external rotation    Knee flexion 0.9 1.5  Knee extension 0.8 1.2  Ankle dorsiflexion    Ankle plantarflexion    Ankle inversion    Ankle eversion    (Blank rows = not tested)  BED MOBILITY:  Assess in future session due to limited time  TRANSFERS: Assistive device utilized: Bariatric RW with sit to stands, slide board to table  Sit to stand: unable to reach full stand Stand to sit: unable to reach full stand  Chair to chair: slide board with CGA   GAIT: Unable to ambulate at this time.   FUNCTIONAL TESTS:  Sit to stand: tricep press with BUE; unable to bring  arm to walker.    Function In Sitting Test (FIST)  (1/2 femur on surface; hips/knees flexed to 90deg)   - indicate bed or mat table / step stool if used  SCORING KEY: 4 = Independent (completes task independently & successfully) 3 = Verbal Cues/Increased Time (completes task independently & successfully and only needs more time/cues) 2 = Upper Extremity Support (must use UE for support or assistance to complete successfully) 1 = Needs Assistance (unable to complete w/o physical assist; DOCUMENT LEVEL: min, mod, max) 0 = Dependent (requires complete physical assist; unable to complete successfully even w/ physical assist)  Randomly Administer Once Throughout Exam  4 - Anterior Nudge (superior sternum)  4 - Posterior Nudge (between scapular spines)  4 - Lateral Nudge (to dominant side at acromion)     4 - Static sitting (30 seconds)  4 - Sitting, shake 'no' (left and right)  4 - Sitting, eyes closed (30 seconds)   0 - Sitting, lift foot (dominant side, lift foot 1 inch twice)    2 - Pick up object from behind (object at midline, hands breadth posterior)  3 - Forward reach (use dominant arm, must complete full motion) 2 - Lateral reach (use dominant arm, clear opposite ischial tuberosity) 2 - Pick up object from floor (from between feet)   2 - Posterior scooting (move backwards 2 inches)  2 - Anterior scooting (move forward 2 inches)  2 - Lateral scooting (move to dominant side 2 inches)    TOTAL = 39/56  Notes/comments: slide board tranfer to/from table   MCD > 5 points MCID for IP REHAB > 6 points  Function In Sitting Test (FIST) 08/14/23 (1/2 femur on surface;  hips/knees flexed to 90deg)   - indicate bed or mat table / step stool if used  SCORING KEY: 4 = Independent (completes task independently & successfully) 3 = Verbal Cues/Increased Time (completes task independently & successfully and only needs more time/cues) 2 = Upper Extremity Support (must use UE for support  or assistance to complete successfully) 1 = Needs Assistance (unable to complete w/o physical assist; DOCUMENT LEVEL: min, mod, max) 0 = Dependent (requires complete physical assist; unable to complete successfully even w/ physical assist)  Randomly Administer Once Throughout Exam  4 - Anterior Nudge (superior sternum)  4 - Posterior Nudge (between scapular spines)  4 - Lateral Nudge (to dominant side at acromion)     4 - Static sitting (30 seconds)  4 - Sitting, shake 'no' (left and right)  4 - Sitting, eyes closed (30 seconds)   0 - Sitting, lift foot (dominant side, lift foot 1 inch twice)    4 - Pick up object from behind (object at midline, hands breadth posterior)  4 - Forward reach (use dominant arm, must complete full motion) 4 - Lateral reach (use dominant arm, clear opposite ischial tuberosity)  - Pick up object from floor (from between feet)   3 - Posterior scooting (move backwards 2 inches)  3 - Anterior scooting (move forward 2 inches)  3 - Lateral scooting (move to dominant side 2 inches)    TOTAL = 47/56  MCD > 5 points MCID for IP REHAB > 6 points   TREATMENT DATE: 02/28/24  TherEx:  Seated lat lift offs in the chair, 2x5    Gait:   Pt ambulated for a total of 4 attempts, 22' on first attempt, 42' on the second attempt, 12' on the third attempt, and 7' 6 on the final attempt (52' 6 in total).  Pt utilized the bariatric FWW, pillow under the thighs for easier ability to come upright, and sock applied to the L foot to reduce friction of the foot in mobility.   Several seated rest breaks utilize with therapist monitoring symptoms and pt response to ambulation throughout the session    PATIENT EDUCATION: Education details: Pt educated throughout session about proper posture and technique with exercises. Improved exercise technique, movement at target joints, use of target muscles after min to mod verbal, visual, tactile cues.  Person educated:  Patient Education method: Explanation, Demonstration, Tactile cues, and Verbal cues Education comprehension: verbalized understanding, returned demonstration, verbal cues required, tactile cues required, and needs further education  HOME EXERCISE PROGRAM: Stand 3x/day in stander   GOALS: Goals reviewed with patient? Yes  SHORT TERM GOALS: Target date: 08/08/2023  Patient will be independent in home exercise program to improve strength/mobility for better functional independence with ADLs.  Baseline: 4/8: compliance Goal status: MET    LONG TERM GOALS: Target date: 03/20/2024  Patient will tolerate five consecutive stands with UE support from wheelchair to standing to improve functional mobility with additional cushion and RW.  Baseline: unable to perform 4/8:unable to perform full stand 5/20: perform next session 5/29: two full stands with CGA and cushion on her seat 7/31: able to stand 3 times from [facility]. With no addition cushion  in parallel bars. 12/27/2023= Will retest next session- concentrated on just standing on past 2 visits Goal status: Ongoing   2.  Patient will ambulate 10 ft with bRW with wheelchair follow.  Baseline:  unable to ambulate 4/8: unable to ambulate 5/20: able to ambulate length of // bars in  previous session 5/27: ambulate 2.5 lengths of // bars with two seated rest breaks with min A for weight shift and wheelchair follow  7/31: able to ambulate in parallel bars 3 ft with min assist and +2 follow in WC> difficulty advancing the LLE on this day. 12/27/2023- Patient unable to take a step forward today but was abel to take a step on previous visit- limited by clonus like trembling that has progressed over past month. Goal status: Ongoing   3.  Patient will improve FIST score >6 points to demonstrate improved stability and ability to perform ADLs.  Baseline:  3/6: 39/56 4/8: 47/ 56 5/20: 48/56 7/31:46/56 Goal status: MET  4.  Patient will perform toileting with mod I at  home for improved independence.  Baseline: 3/6: requires assistance 4/8: requires dependence on machine 5/20: requires assistance with use of wipe buddy.  7/31: continues to require use of sara steady and wipe buddy for safety and hygiene. 12/27/2023- Patient reports that since she is unable to take steps that she continues to require use of sara steady and wipe buddy for safe personal hygiene. Goal status: ongoing   5. Patient will perform > 5 min of static or dynamic standing with BUE and Min/mod assist  for improved LE strength and pre-gait function to assist with toileting and overall standing ability for improve functional independence.  Baseline: 12/27/2023-Patient was able to stand for just over 3 min today. 01/10/24: Pt able to stand 1 min 38 sec 02/14/24: Pt able to >3 minutes at this time. Goal status: PROGRESSING    ASSESSMENT:  CLINICAL IMPRESSION:  Pt able to ambulate the furthest today that she has been able to ambulate during therapy for >1 year.  Pt ultimately needed an extra attempt compared to her previous best, but is making good progress towards goals at this time.  Pt encouraged to continue to focus on transfers at home and triceps strength to improve transfers.   Pt will continue to benefit from skilled therapy to address remaining deficits in order to improve overall QoL and return to PLOF.        OBJECTIVE IMPAIRMENTS: Abnormal gait, cardiopulmonary status limiting activity, decreased activity tolerance, decreased balance, decreased coordination, decreased endurance, decreased mobility, difficulty walking, decreased ROM, decreased strength, hypomobility, increased fascial restrictions, impaired perceived functional ability, impaired flexibility, impaired sensation, improper body mechanics, and postural dysfunction.   ACTIVITY LIMITATIONS: carrying, lifting, bending, sitting, standing, squatting, sleeping, stairs, transfers, bed mobility, continence, bathing, toileting,  dressing, self feeding, reach over head, hygiene/grooming, locomotion level, and caring for others  PARTICIPATION LIMITATIONS: meal prep, cleaning, laundry, interpersonal relationship, driving, shopping, community activity, and church  PERSONAL FACTORS: Age, Past/current experiences, Time since onset of injury/illness/exacerbation, Transportation, and 3+ comorbidities: ack pain, CBP, chronic L shoulder pain, galactorrhea, neuropathy, HPV, hypercholestermeia, HTN, MS, osteopenia, PONV, wrist fracture are also affecting patient's functional outcome.   REHAB POTENTIAL: Good  CLINICAL DECISION MAKING: Evolving/moderate complexity  EVALUATION COMPLEXITY: Moderate  PLAN:  PT FREQUENCY: 2x/week  PT DURATION: 12 weeks  PLANNED INTERVENTIONS: 97164- PT Re-evaluation, 97110-Therapeutic exercises, 97530- Therapeutic activity, 97112- Neuromuscular re-education, 97535- Self Care, [id number]- Manual therapy, [phone]- Gait training, [phone]- Orthotic Fit/training, [phone]- Canalith repositioning, [id number]- Aquatic Therapy, [phone]- Electrical stimulation (unattended), [phone]- Electrical stimulation (manual), [id number]- Ultrasound, [id number]- Traction (mechanical), Patient/Family education, Balance training, Stair training, Taping, Dry Needling, Joint mobilization, Joint manipulation, Spinal manipulation, Spinal mobilization, Scar mobilization, Compression bandaging, Vestibular training, Visual/preceptual remediation/compensation, Cognitive remediation, DME instructions, Cryotherapy, Moist heat, and Biofeedback  PLAN  FOR NEXT SESSION:   Strap stand frame with more dynamic activities Sit to stands; try supported walking, continue dynamic core stabilization training and seated reaching tasks to improve weight shifting.    Fonda Simpers, PT, DPT Physical Therapist - [facility]  02/28/24, 2:35 PM

## 2024-03-04 ENCOUNTER — Ambulatory Visit: Payer: PPO

## 2024-03-04 ENCOUNTER — Ambulatory Visit

## 2024-03-04 DIAGNOSIS — R2681 Unsteadiness on feet: Secondary | ICD-10-CM

## 2024-03-04 DIAGNOSIS — R2689 Other abnormalities of gait and mobility: Secondary | ICD-10-CM

## 2024-03-04 DIAGNOSIS — R278 Other lack of coordination: Secondary | ICD-10-CM

## 2024-03-04 DIAGNOSIS — G35D Multiple sclerosis, unspecified: Secondary | ICD-10-CM

## 2024-03-04 DIAGNOSIS — M6281 Muscle weakness (generalized): Secondary | ICD-10-CM

## 2024-03-04 DIAGNOSIS — R262 Difficulty in walking, not elsewhere classified: Secondary | ICD-10-CM

## 2024-03-04 DIAGNOSIS — R269 Unspecified abnormalities of gait and mobility: Secondary | ICD-10-CM

## 2024-03-04 NOTE — Therapy (Signed)
 OUTPATIENT PHYSICAL THERAPY NEURO TREATMENT   Patient Name: Lisa Williams MRN: [id number] DOB:04/01/1961, 63 y.o., female Today's Date: 03/04/2024  PCP: Lenon Na  REFERRING PROVIDER: Lenon Na   END OF SESSION:  PT End of Session - 03/04/24 1021     Visit Number 65    Number of Visits 70    Date for Recertification  03/20/24   corrected   Progress Note Due on Visit 50    PT Start Time 1018    PT Stop Time 1100    PT Time Calculation (min) 42 min    Equipment Utilized During Treatment Gait belt    Activity Tolerance Patient tolerated treatment well;No increased pain    Behavior During Therapy [facility] for tasks assessed/performed         Past Medical History:  Diagnosis Date   Abdominal pain, right upper quadrant    Back pain    Calculus of kidney 12/09/2013   Chronic back pain    unspecified   Chronic left shoulder pain 07/19/2015   Complication of anesthesia    Functional disorder of bladder    other   Galactorrhea 11/26/2014   Chronic    Hereditary and idiopathic neuropathy 08/19/2013   History of kidney stones    HPV test positive    Hypercholesteremia 08/19/2013   Hypertension    Incomplete bladder emptying    Microscopic hematuria    MS (multiple sclerosis)    Muscle spasticity 05/21/2014   Nonspecific findings on examination of urine    other   Osteopenia    PONV (postoperative nausea and vomiting)    Status post laparoscopic supracervical hysterectomy 11/26/2014   Tobacco user 11/26/2014   Wrist fracture    Past Surgical History:  Procedure Laterality Date   bilateral tubal ligation  1996   BREAST CYST EXCISION Left 2002   CYST EXCISION Left 05/10/2022   Procedure: CYST REMOVAL;  Surgeon: Rodolph Romano, MD;  Location: [facility] ORS;  Service: General;  Laterality: Left;   FRACTURE SURGERY     KNEE SURGERY     right   LAPAROSCOPIC SUPRACERVICAL HYSTERECTOMY  08/05/2013   ORIF WRIST FRACTURE Left 01/17/2017    Procedure: OPEN REDUCTION INTERNAL FIXATION (ORIF) WRIST FRACTURE;  Surgeon: Leora Lynwood SAUNDERS, MD;  Location: [facility] OR[facility];  Service: Orthopedics;  Laterality: Left;   RADIOLOGY WITH ANESTHESIA N/A 03/18/2020   Procedure: MRI WITH ANESTHESIA CERVICAL SPINE AND BRAIN  WITH AND WITHOUT CONTRAST;  Surgeon: Radiologist, Medication, MD;  Location: [facility] OR;  Service: Radiology;  Laterality: N/A;   TUBAL LIGATION Bilateral    VAGINAL HYSTERECTOMY  03/2006   Patient Active Problem List   Diagnosis Date Noted   Hyperglycemia 01/09/2024   Abnormal LFTs (liver function tests) 09/17/2023   Sinus tachycardia 07/07/2023   Aortic atherosclerosis 01/18/2023   Prediabetes 01/18/2023   Hypokalemia 07/29/2022   Weakness of both lower extremities 07/29/2022   Abscess of left groin 11/15/2021   Abnormal LFTs 11/15/2021   Acute respiratory disease due to COVID-19 virus 11/21/2020   Weakness    Hypoalbuminemia due to protein-calorie malnutrition    Neurogenic bowel    Neurogenic bladder    Labile blood pressure    Neuropathic pain    Multiple sclerosis, relapsing-remitting 07/09/2019   Abscess of female pelvis    SVT (supraventricular tachycardia)    Radial styloid tenosynovitis 03/12/2018   Wheelchair dependence 02/27/2018   Localized osteoporosis with current pathological fracture with routine healing 01/19/2017   Wrist fracture 01/16/2017  Sprain of ankle 03/23/2016   Closed fracture of lateral malleolus 03/16/2016   Health care maintenance 01/24/2016   Blood pressure elevated without history of HTN 10/25/2015   Essential hypertension 10/25/2015   Multiple sclerosis 10/02/2015   Chronic left shoulder pain 07/19/2015   Multiple sclerosis exacerbation 07/14/2015   MS (multiple sclerosis) 11/26/2014   Increased body mass index 11/26/2014   HPV test positive 11/26/2014   Status post laparoscopic supracervical hysterectomy 11/26/2014   Galactorrhea 11/26/2014   Back ache 05/21/2014   Adiposity  05/21/2014   Disordered sleep 05/21/2014   Muscle spasticity 05/21/2014   Spasticity 05/21/2014   Calculus of kidney 12/09/2013   Renal colic 12/09/2013   Hypercholesteremia 08/19/2013   Hereditary and idiopathic neuropathy 08/19/2013   Hypercholesterolemia without hypertriglyceridemia 08/19/2013   Bladder infection, chronic 07/25/2012   Disorder of bladder function 07/25/2012   Incomplete bladder emptying 07/25/2012   Microscopic hematuria 07/25/2012   Right upper quadrant pain 07/25/2012    ONSET DATE: 1995  REFERRING DIAG: MS  THERAPY DIAG:  Multiple sclerosis exacerbation  Other lack of coordination  Muscle weakness (generalized)  Difficulty in walking, not elsewhere classified  Unsteadiness on feet  Abnormality of gait and mobility  Other abnormalities of gait and mobility  Rationale for Evaluation and Treatment: Rehabilitation  SUBJECTIVE:                                                                                                                                                                                             SUBJECTIVE STATEMENT:  Pt reports that the she is feeling cold and   Pt accompanied by: self  PERTINENT HISTORY:  Patient is returning to PT s/p hospitalization.  s/p ORIF of R tibia shaft fracture 09/23/2022. Patient has weakness in BLE with RLE>LLE. She drives with hand controls. Patient has been diagnosed with MS in 1995. PMH includes: back pain, CBP, chronic L shoulder pain, galactorrhea, neuropathy, HPV, hypercholestermeia, HTN, MS, osteopenia, PONV, wrist fracture. Additional order for other closed fracture of proximal end of R tibia with routine healing. Still has to use [employer].   PAIN:  Are you having pain? Occasional pain in RLE; primarily in knee  PRECAUTIONS: Fall  RED FLAGS: None   WEIGHT BEARING RESTRICTIONS: No  FALLS: Has patient fallen in last 6 months? No  LIVING ENVIRONMENT: Lives with: lives with their  family Lives in: House/apartment Stairs: ramp Has following equipment at home: Vannie - 2 wheeled, Wheelchair (manual), shower chair, and Grab bars  PLOF: Independent with household mobility with device  PATIENT GOALS: to get her independence back. To be able to get  into car, toilet, and get dressed independently  OBJECTIVE:  Note: Objective measures were completed at Evaluation unless otherwise noted.  DIAGNOSTIC FINDINGS: MRI of the brain 03/18/2020 showed multiple T2/FLAIR hyperintense foci in the periventricular, juxtacortical and deep white matter.  There were no infratentorial lesions noted.  None of the foci enhanced.   Due to severe claustrophobia, the study was done with conscious sedation in the hospital   COGNITION: Overall cognitive status: Within functional limits for tasks assessed   SENSATION: Lack of sensation in feet, loss in bilateral lateral aspect of knee  COORDINATION: Does not have the strength for functional LE heel slide test   MUSCLE TONE: BLE mild tone    POSTURE: rounded shoulders, forward head, anterior pelvic tilt, and weight shift left   LOWER EXTREMITY MMT:    MMT Right Eval Left Eval  Hip flexion 0.9 1.5  Hip extension    Hip abduction 1.8 2.6  Hip adduction 3.1 1.9  Hip internal rotation    Hip external rotation    Knee flexion 0.9 1.5  Knee extension 0.8 1.2  Ankle dorsiflexion    Ankle plantarflexion    Ankle inversion    Ankle eversion    (Blank rows = not tested)  BED MOBILITY:  Assess in future session due to limited time  TRANSFERS: Assistive device utilized: Bariatric RW with sit to stands, slide board to table  Sit to stand: unable to reach full stand Stand to sit: unable to reach full stand  Chair to chair: slide board with CGA   GAIT: Unable to ambulate at this time.   FUNCTIONAL TESTS:  Sit to stand: tricep press with BUE; unable to bring arm to walker.    Function In Sitting Test (FIST)  (1/2 femur on  surface; hips/knees flexed to 90deg)   - indicate bed or mat table / step stool if used  SCORING KEY: 4 = Independent (completes task independently & successfully) 3 = Verbal Cues/Increased Time (completes task independently & successfully and only needs more time/cues) 2 = Upper Extremity Support (must use UE for support or assistance to complete successfully) 1 = Needs Assistance (unable to complete w/o physical assist; DOCUMENT LEVEL: min, mod, max) 0 = Dependent (requires complete physical assist; unable to complete successfully even w/ physical assist)  Randomly Administer Once Throughout Exam  4 - Anterior Nudge (superior sternum)  4 - Posterior Nudge (between scapular spines)  4 - Lateral Nudge (to dominant side at acromion)     4 - Static sitting (30 seconds)  4 - Sitting, shake 'no' (left and right)  4 - Sitting, eyes closed (30 seconds)   0 - Sitting, lift foot (dominant side, lift foot 1 inch twice)    2 - Pick up object from behind (object at midline, hands breadth posterior)  3 - Forward reach (use dominant arm, must complete full motion) 2 - Lateral reach (use dominant arm, clear opposite ischial tuberosity) 2 - Pick up object from floor (from between feet)   2 - Posterior scooting (move backwards 2 inches)  2 - Anterior scooting (move forward 2 inches)  2 - Lateral scooting (move to dominant side 2 inches)    TOTAL = 39/56  Notes/comments: slide board tranfer to/from table   MCD > 5 points MCID for IP REHAB > 6 points  Function In Sitting Test (FIST) 08/14/23 (1/2 femur on surface; hips/knees flexed to 90deg)   - indicate bed or mat table / step stool if used  SCORING KEY: 4 = Independent (completes task independently & successfully) 3 = Verbal Cues/Increased Time (completes task independently & successfully and only needs more time/cues) 2 = Upper Extremity Support (must use UE for support or assistance to complete successfully) 1 = Needs Assistance (unable  to complete w/o physical assist; DOCUMENT LEVEL: min, mod, max) 0 = Dependent (requires complete physical assist; unable to complete successfully even w/ physical assist)  Randomly Administer Once Throughout Exam  4 - Anterior Nudge (superior sternum)  4 - Posterior Nudge (between scapular spines)  4 - Lateral Nudge (to dominant side at acromion)     4 - Static sitting (30 seconds)  4 - Sitting, shake 'no' (left and right)  4 - Sitting, eyes closed (30 seconds)   0 - Sitting, lift foot (dominant side, lift foot 1 inch twice)    4 - Pick up object from behind (object at midline, hands breadth posterior)  4 - Forward reach (use dominant arm, must complete full motion) 4 - Lateral reach (use dominant arm, clear opposite ischial tuberosity)  - Pick up object from floor (from between feet)   3 - Posterior scooting (move backwards 2 inches)  3 - Anterior scooting (move forward 2 inches)  3 - Lateral scooting (move to dominant side 2 inches)    TOTAL = 47/56  MCD > 5 points MCID for IP REHAB > 6 points   TREATMENT DATE: 03/04/24  TherEx:  Seated lat lift offs in the chair, 2x5 Seated scapular retractions, 12.5#, 2x10 Seated scapular retractions, 22.5#, 2x10 Seated lat pull downs, 17.5# each side (35# total), 2x15    Gait:   Pt ambulated for a total of 3 attempts, 9' 6 on first attempt, 10' on the second attempt, 6' on the third attempt (26' 6 in total).  Pt utilized the bariatric FWW, pillow under the thighs for easier ability to come upright, and sock applied to the L foot to reduce friction of the foot in mobility.   Several seated rest breaks utilize with therapist monitoring symptoms and pt response to ambulation throughout the session    PATIENT EDUCATION: Education details: Pt educated throughout session about proper posture and technique with exercises. Improved exercise technique, movement at target joints, use of target muscles after min to mod verbal, visual,  tactile cues.  Person educated: Patient Education method: Explanation, Demonstration, Tactile cues, and Verbal cues Education comprehension: verbalized understanding, returned demonstration, verbal cues required, tactile cues required, and needs further education  HOME EXERCISE PROGRAM: Stand 3x/day in stander   GOALS: Goals reviewed with patient? Yes  SHORT TERM GOALS: Target date: 08/08/2023  Patient will be independent in home exercise program to improve strength/mobility for better functional independence with ADLs.  Baseline: 4/8: compliance Goal status: MET    LONG TERM GOALS: Target date: 03/20/2024  Patient will tolerate five consecutive stands with UE support from wheelchair to standing to improve functional mobility with additional cushion and RW.  Baseline: unable to perform 4/8:unable to perform full stand 5/20: perform next session 5/29: two full stands with CGA and cushion on her seat 7/31: able to stand 3 times from [facility]. With no addition cushion  in parallel bars. 12/27/2023= Will retest next session- concentrated on just standing on past 2 visits Goal status: Ongoing   2.  Patient will ambulate 10 ft with bRW with wheelchair follow.  Baseline:  unable to ambulate 4/8: unable to ambulate 5/20: able to ambulate length of // bars in previous session 5/27: ambulate  2.5 lengths of // bars with two seated rest breaks with min A for weight shift and wheelchair follow  7/31: able to ambulate in parallel bars 3 ft with min assist and +2 follow in WC> difficulty advancing the LLE on this day. 12/27/2023- Patient unable to take a step forward today but was abel to take a step on previous visit- limited by clonus like trembling that has progressed over past month. Goal status: Ongoing   3.  Patient will improve FIST score >6 points to demonstrate improved stability and ability to perform ADLs.  Baseline:  3/6: 39/56 4/8: 47/ 56 5/20: 48/56 7/31:46/56 Goal status: MET  4.  Patient  will perform toileting with mod I at home for improved independence.  Baseline: 3/6: requires assistance 4/8: requires dependence on machine 5/20: requires assistance with use of wipe buddy.  7/31: continues to require use of sara steady and wipe buddy for safety and hygiene. 12/27/2023- Patient reports that since she is unable to take steps that she continues to require use of sara steady and wipe buddy for safe personal hygiene. Goal status: ongoing   5. Patient will perform > 5 min of static or dynamic standing with BUE and Min/mod assist  for improved LE strength and pre-gait function to assist with toileting and overall standing ability for improve functional independence.  Baseline: 12/27/2023-Patient was able to stand for just over 3 min today. 01/10/24: Pt able to stand 1 min 38 sec 02/14/24: Pt able to >3 minutes at this time. Goal status: PROGRESSING    ASSESSMENT:  CLINICAL IMPRESSION:  Pt responded well to the ambulation attempt today, considering she normally does not do as well on Tuesday's and specifically not with the current weather conditions.  Pt still put forth great effort throughout the session and was able to ambulate 26' today.  Pt also able to perform upper body exercises to promote continued endurance necessary fro transfers and mobility.   Pt will continue to benefit from skilled therapy to address remaining deficits in order to improve overall QoL and return to PLOF.         OBJECTIVE IMPAIRMENTS: Abnormal gait, cardiopulmonary status limiting activity, decreased activity tolerance, decreased balance, decreased coordination, decreased endurance, decreased mobility, difficulty walking, decreased ROM, decreased strength, hypomobility, increased fascial restrictions, impaired perceived functional ability, impaired flexibility, impaired sensation, improper body mechanics, and postural dysfunction.   ACTIVITY LIMITATIONS: carrying, lifting, bending, sitting, standing,  squatting, sleeping, stairs, transfers, bed mobility, continence, bathing, toileting, dressing, self feeding, reach over head, hygiene/grooming, locomotion level, and caring for others  PARTICIPATION LIMITATIONS: meal prep, cleaning, laundry, interpersonal relationship, driving, shopping, community activity, and church  PERSONAL FACTORS: Age, Past/current experiences, Time since onset of injury/illness/exacerbation, Transportation, and 3+ comorbidities: ack pain, CBP, chronic L shoulder pain, galactorrhea, neuropathy, HPV, hypercholestermeia, HTN, MS, osteopenia, PONV, wrist fracture are also affecting patient's functional outcome.   REHAB POTENTIAL: Good  CLINICAL DECISION MAKING: Evolving/moderate complexity  EVALUATION COMPLEXITY: Moderate  PLAN:  PT FREQUENCY: 2x/week  PT DURATION: 12 weeks  PLANNED INTERVENTIONS: 97164- PT Re-evaluation, 97110-Therapeutic exercises, 97530- Therapeutic activity, 97112- Neuromuscular re-education, 97535- Self Care, [id number]- Manual therapy, [phone]- Gait training, [phone]- Orthotic Fit/training, [phone]- Canalith repositioning, [id number]- Aquatic Therapy, [phone]- Electrical stimulation (unattended), [phone]- Electrical stimulation (manual), [id number]- Ultrasound, [id number]- Traction (mechanical), Patient/Family education, Balance training, Stair training, Taping, Dry Needling, Joint mobilization, Joint manipulation, Spinal manipulation, Spinal mobilization, Scar mobilization, Compression bandaging, Vestibular training, Visual/preceptual remediation/compensation, Cognitive remediation, DME instructions, Cryotherapy, Moist heat, and Biofeedback  PLAN  FOR NEXT SESSION:   Strap stand frame with more dynamic activities Sit to stands; try supported walking, continue dynamic core stabilization training and seated reaching tasks to improve weight shifting.    Fonda Simpers, PT, DPT Physical Therapist - [facility]  03/04/24, 1:07 PM

## 2024-03-06 ENCOUNTER — Ambulatory Visit

## 2024-03-06 ENCOUNTER — Ambulatory Visit: Payer: PPO

## 2024-03-06 DIAGNOSIS — M6281 Muscle weakness (generalized): Secondary | ICD-10-CM

## 2024-03-06 DIAGNOSIS — G35D Multiple sclerosis, unspecified: Secondary | ICD-10-CM

## 2024-03-06 DIAGNOSIS — R2689 Other abnormalities of gait and mobility: Secondary | ICD-10-CM

## 2024-03-06 DIAGNOSIS — R278 Other lack of coordination: Secondary | ICD-10-CM

## 2024-03-06 DIAGNOSIS — R2681 Unsteadiness on feet: Secondary | ICD-10-CM

## 2024-03-06 DIAGNOSIS — R262 Difficulty in walking, not elsewhere classified: Secondary | ICD-10-CM

## 2024-03-06 DIAGNOSIS — R269 Unspecified abnormalities of gait and mobility: Secondary | ICD-10-CM

## 2024-03-06 NOTE — Therapy (Signed)
 OUTPATIENT OCCUPATIONAL THERAPY NEURO TREATMENT NOTE  Patient Name: Lisa Williams MRN: [id number] DOB:1960-10-25, 63 y.o., female Today's Date: 03/06/2024  PCP: Dr. Layman Williams   Neurologist Dr. Suanne at [facility] REFERRING PROVIDER: Dr. Layman Williams    END OF SESSION:  OT End of Session - 03/06/24 2026     Visit Number 52    Number of Visits 67    Date for Recertification  04/29/24    Authorization Time Period Reporting period beginning 02/28/24    Progress Note Due on Visit 60    OT Start Time 1100    OT Stop Time 1140    OT Time Calculation (min) 40 min    Equipment Utilized During Treatment manual wc    Activity Tolerance Patient tolerated treatment well    Behavior During Therapy [facility] for tasks assessed/performed         Past Medical History:  Diagnosis Date   Abdominal pain, right upper quadrant    Back pain    Calculus of kidney 12/09/2013   Chronic back pain    unspecified   Chronic left shoulder pain 07/19/2015   Complication of anesthesia    Functional disorder of bladder    other   Galactorrhea 11/26/2014   Chronic    Hereditary and idiopathic neuropathy 08/19/2013   History of kidney stones    HPV test positive    Hypercholesteremia 08/19/2013   Hypertension    Incomplete bladder emptying    Microscopic hematuria    MS (multiple sclerosis)    Muscle spasticity 05/21/2014   Nonspecific findings on examination of urine    other   Osteopenia    PONV (postoperative nausea and vomiting)    Status post laparoscopic supracervical hysterectomy 11/26/2014   Tobacco user 11/26/2014   Wrist fracture    Past Surgical History:  Procedure Laterality Date   bilateral tubal ligation  1996   BREAST CYST EXCISION Left 2002   CYST EXCISION Left 05/10/2022   Procedure: CYST REMOVAL;  Surgeon: Lisa Romano, MD;  Location: [facility] ORS;  Service: General;  Laterality: Left;   FRACTURE SURGERY     KNEE SURGERY     right   LAPAROSCOPIC  SUPRACERVICAL HYSTERECTOMY  08/05/2013   ORIF WRIST FRACTURE Left 01/17/2017   Procedure: OPEN REDUCTION INTERNAL FIXATION (ORIF) WRIST FRACTURE;  Surgeon: Lisa Lynwood SAUNDERS, MD;  Location: [facility] ORS;  Service: Orthopedics;  Laterality: Left;   RADIOLOGY WITH ANESTHESIA N/A 03/18/2020   Procedure: MRI WITH ANESTHESIA CERVICAL SPINE AND BRAIN  WITH AND WITHOUT CONTRAST;  Surgeon: Radiologist, Medication, MD;  Location: [facility] OR;  Service: Radiology;  Laterality: N/A;   TUBAL LIGATION Bilateral    VAGINAL HYSTERECTOMY  03/2006   Patient Active Problem List   Diagnosis Date Noted   Hyperglycemia 01/09/2024   Abnormal LFTs (liver function tests) 09/17/2023   Sinus tachycardia 07/07/2023   Aortic atherosclerosis 01/18/2023   Prediabetes 01/18/2023   Hypokalemia 07/29/2022   Weakness of both lower extremities 07/29/2022   Abscess of left groin 11/15/2021   Abnormal LFTs 11/15/2021   Acute respiratory disease due to COVID-19 virus 11/21/2020   Weakness    Hypoalbuminemia due to protein-calorie malnutrition    Neurogenic bowel    Neurogenic bladder    Labile blood pressure    Neuropathic pain    Multiple sclerosis, relapsing-remitting 07/09/2019   Abscess of female pelvis    SVT (supraventricular tachycardia)    Radial styloid tenosynovitis 03/12/2018   Wheelchair dependence 02/27/2018   Localized  osteoporosis with current pathological fracture with routine healing 01/19/2017   Wrist fracture 01/16/2017   Sprain of ankle 03/23/2016   Closed fracture of lateral malleolus 03/16/2016   Health care maintenance 01/24/2016   Blood pressure elevated without history of HTN 10/25/2015   Essential hypertension 10/25/2015   Multiple sclerosis 10/02/2015   Chronic left shoulder pain 07/19/2015   Multiple sclerosis exacerbation 07/14/2015   MS (multiple sclerosis) 11/26/2014   Increased body mass index 11/26/2014   HPV test positive 11/26/2014   Status post laparoscopic supracervical hysterectomy  11/26/2014   Galactorrhea 11/26/2014   Back ache 05/21/2014   Adiposity 05/21/2014   Disordered sleep 05/21/2014   Muscle spasticity 05/21/2014   Spasticity 05/21/2014   Calculus of kidney 12/09/2013   Renal colic 12/09/2013   Hypercholesteremia 08/19/2013   Hereditary and idiopathic neuropathy 08/19/2013   Hypercholesterolemia without hypertriglyceridemia 08/19/2013   Bladder infection, chronic 07/25/2012   Disorder of bladder function 07/25/2012   Incomplete bladder emptying 07/25/2012   Microscopic hematuria 07/25/2012   Right upper quadrant pain 07/25/2012   ONSET DATE: 5/24 Amon and broke R leg which caused beginning of ADL decline; diagnosed with MS in 1995)  REFERRING DIAG: MS  THERAPY DIAG:  Muscle weakness (generalized)  Other lack of coordination  Multiple sclerosis exacerbation  Rationale for Evaluation and Treatment: Rehabilitation  SUBJECTIVE:  SUBJECTIVE STATEMENT: Pt requested to avoid any activities requiring engagement of BLEs d/t knees feeling like they were buckling during PT session today. Pt accompanied by: self  PERTINENT HISTORY: Pt reports increased difficulty with basic self care tasks since breaking her R leg last May.  Since that time, mobility and ADLs have declined, with pt requiring assist from private caregivers and family to manage bathing, toileting, dressing, and functional transfers.  PRECAUTIONS: Fall  WEIGHT BEARING RESTRICTIONS: No  PAIN: 03/06/24: No pain today  Are you having pain? No; occasional pain in R lower back, but not today  FALLS: Has patient fallen in last 6 months? No Last fall was May 17 of 2024  LIVING ENVIRONMENT: Lives with: lives with their family (including mother who has dementia and sister Lisa Williams)  Lives in: 2 level home but pt resides on main level Stairs: ramp  Has following equipment at home: Wheelchair (manual), Shower bench, and hand held shower shower, 1 grab bar in the shower, 3in1 commode, sit to  stand lift (electric), FWW, hospital bed  PLOF: modified indep with ADL/IADLs prior to May of 7975   PATIENT GOALS: Increase independence with basic self care tasks  OBJECTIVE:  Note: Objective measures were completed at Evaluation unless otherwise noted.  HAND DOMINANCE: Right  ADLs:  Overall ADLs: assist provided from paid caregiver and sister Transfers/ambulation related to ADLs: sit to stand lift for all transfers, set up for sliding board in/out of bed  Eating: indep  Grooming: modified indep (wc level in mom's bathroom)  UB Dressing: set up (can't access her bedroom closet)  LB Dressing: able to sit on side of bed to don all LB clothing; assist to hike pants/underwear Toileting: assist with clothing management d/t standing within electric lift Bathing: bed bath with sister helping to wash backside Tub Shower transfers: N/A; pt has walk in shower in mom's bathroom (wc does fit in this bathroom but pt reports inability to transfer from wc and requires arm rests on a bench for a successful transfer attempt from any DME).  Tub bench is in pt's bathroom, but lift does not fit through bathroom doorway and  pt reports inability to transfer up from tub bench (currently unable to manage either transfer)   Equipment: see above  IADLs: Shopping: [employer] for shopping Light housekeeping: modified indep from wc level Meal Prep: modified indep from wc level Community mobility: relies on community transportation or family members Medication management: indep Landscape architect: indep Handwriting: NT; pt denies any FMC challenges  MOBILITY STATUS: Hx of falls  POSTURE COMMENTS:  Rounded shoulders, forward head, anterior pelvic tilt, and weight shift left   ACTIVITY TOLERANCE: Eval: Activity tolerance: Per PT; currently standing 30-60 sec within Light Gait harness/lift; currently non-ambulatory  01/24/24: Standing at sink: 1 minute; min A and 5 attempts to achieve fully erect  standing position with BUEs supported on sink countertop, OT assist to block R knee to prevent buckling   02/28/24: x4 attempts, achieves 75% upright standing with MIN A to block knees.   UPPER EXTREMITY ROM:  BUEs WFL  UPPER EXTREMITY MMT:     MMT Right eval Left eval  Shoulder flexion 4+ 4  Shoulder abduction 4+ 4  Shoulder adduction    Shoulder extension    Shoulder internal rotation 4+ 4+  Shoulder external rotation 4 4  Middle trapezius    Lower trapezius    Elbow flexion 4+ 4+  Elbow extension 4+ 4+  Wrist flexion 4+ 4+  Wrist extension 4+ 4+  Wrist ulnar deviation    Wrist radial deviation    Wrist pronation    Wrist supination    (Blank rows = not tested)  HAND FUNCTION/COORDINATION:  R/L WNL; pt denies any coordination deficits/ able to manipulate pills/clothing fasteners/etc without difficulty  11/15/23: *Measures taken d/t PT note revealing pt with increased difficulty manipulating smaller ADL supplies during reaching activities in PT session   11/15/23: Grip strength: R: 70 lbs, L: 63 lbs Pinch strength: Lateral: R: 16 lbs, L: 15 lbs; 3 point pinch: R: 12 lbs, L: 13 lbs 9 hole peg test: Right: 25 sec, L 49 sec   12/25/23: Grip strength: Right: 70 lbs; Left: 65 lbs  9 hole peg test: Right: 24 sec; Left: 3 trials: 54 sec, 1 min, 56 sec  01/24/24: Grip grip: Right: 76 lbs; Left: 67 lbs Lateral pinch: R: 17 lbs, L: 17 lbs; 3 point pinch: R: 13 lbs, L: 14 lbs  9 Hole Peg Test: R: 26 sec, L: 50 sec  02/28/24: Grip grip: Right: 85 lbs; Left: 71 lbs Lateral pinch: R: 20 lbs, L: 19 lbs; 3 point pinch: R: 17 lbs, L: 18 lbs  9 Hole Peg Test: R: 29 sec, L: 40 sec  SENSATION: WFL  EDEMA: No visible edema in BUEs  MUSCLE TONE: BUEs WNL  COGNITION: Overall cognitive status: Within functional limits for tasks assessed  VISION: wears glasses all the time, no reports of diplopia  PERCEPTION: Not tested  PRAXIS: WFL  OBSERVATIONS:  Pt pleasant, cooperative, and  motivated to work towards improving indep with ADLs.  TREATMENT DATE: 03/06/24 Therapeutic Exercise: -Core strengthening:  -Ball toss overhead to challenge forward and lateral reaching slightly outside BOS, and overhead within BOS; arm rests removed from wc to eliminate BUE support; feet supported on floor.  -Ball raises overhead x2 sets 10 reps -Trunk rotations R/L holding ball with arms at shoulder level and elbows extended x2 sets 10 reps  Therapeutic Activity: -Facilitated L hand FMC and dexterity skills working to pick up small pegs and place into pegboard.  Removed pegs 1 by 1, storing up to 3-4 in palm, and discarding into dish.     PATIENT EDUCATION: Education details: L hand FMC/dexterity skills  Person educated: Patient Education method: Explanation and Verbal cues Education comprehension: verbalized understanding and returned demonstration  HOME EXERCISE PROGRAM: IR A/AAROM to promote shoulder flexibility for peri care/bathing  GOALS: Goals reviewed with patient? Yes  SHORT TERM GOALS: Target date: 12/25/23  Pt will perform bed bath with set up only. Baseline: Eval: Min-mod A for posterior washing; 09/28/23: Min A for posterior washing; 11/13/23: Able to manage bed bath but sister helps with thoroughness, per pt's preference; pt in agreement to trial bath sitting EOB or in wc before next OT session. Goal status: achieved/d/c  2.  Pt will utilize sliding board for wc<>drop arm commode transfer with SBA. Baseline: Eval: Currently using electric lift for transfer to [facility]; 09/28/23: Not yet completed at home but transfers to commode have been easier without sliding board d/t pt implementing a scoot pivot. Goal status: d/c (pt does not prefer sliding board for commode transfer)  3.  Pt will be indep to perform HEP for maintaining BUE strength for ADLs and functional  transfers. Baseline: Eval: HEP not yet initiated; 09/28/23: Pt is working on core stability exercises from wc, cane stretches for bilat shoulder IR, and tricep extension via wc pushups; 11/13/23: indep Goal status: achieved  LONG TERM GOALS: Target date: 02/05/24  Pt will perform sink bath with set up A. (Revised on 11/13/23 from min A to set up) Baseline: Eval: Currently bed bathing d/t inability to stand at sink without electric lift (lift does not fit into pt's bathroom); 09/28/23: still completing bed bath as pt is not yet able to stand; 11/13/23: Not yet attempted; pt encouraged to attempt before next OT session now that bed bath goal has been met; 12/20/23: Pt is now completing sponge bath with min A while seated EOB (caregiver assists with washing peri area d/t no bed rail to aid in R lateral lean); 01/24/24: Performing with set up on EOB, completing lateral weight shifts to wash posterior/peri area with wc in front of pt and bed rail for LUE support and foot of bed for RUE support  Goal status: achieved  2.  Pt will perform wc<>tub bench transfer with min A. Baseline: Eval: Unable; 09/28/23: Performed in OT clinic with min A, but not yet in the home Goal status: d/c (pt prefers sink bath)  3.  Pt will perform squat pivot transfer wc<>BSC with SBA.  Baseline: Eval: Eval: Currently using electric lift for transfer to [facility]; 09/28/23: Not yet completed in clinic with [facility], and using lift for [facility] in the home still; 11/13/23: Still using Camie lift at home and in clinic transfers are all scoot rather than squat pivots d/t LE weakness; 12/20/23: Pt reports that she plans to use her Camie lift until she can ambulate to her [facility] d/t limited space for wc in her desired location for [facility] at home. Goal status: d/c  4. Pt will perform scoot transfer wc<>toilet using grab bars with supv to allow toileting in community setting.  Baseline: Eval: Pt limits community outings d/t requiring use of lift for [facility] transfers;  09/28/23: Pt has performed 2 successful scoot pivot  Transfers to toilet in OT clinic with min A, extra time.  Further trials with clothing management on toilet needed, but pt can lower and hike  pants while sititng edge of mat via lateral leaning and min A to maintain core stability; 11/13/23: Pt performs with CGA and extra time; 12/19/23: Pt  performs with close supv on most attempts, occasional CGA; efficiency is improving; 01/24/24: ongoing improvements; able to complete in fewer scoots (3 scoots each way from wc<>toilet) d/t improve engagement of Les and improved anterior weight shift; occasional min guard-supv; pt completes with therapist in clinic, but not yet in other community settings with sister, and not yet voided on toilet in community. 02/28/24: has not trialed in community, completes with SBA    Goal status: ongoing  5.  Pt will complete seated clothing management with min A to enable voiding in handicapped stall within community. Baseline: Recert 11/13/23: Pt can transfer to toilet with increased time and effort, but has not yet attempted clothing management or voiding while seated on commode in community setting.  Pt can lower pants to knees while seated in wc, but requires mod A to hike over hips from seated position; 12/20/23: Pt fully lowers panties and shorts while seated on commode with supv, can hike clothing to her her upper thighs, transfers back to wc, and then performs a tricep extension from wc level to allow caregiver to hike clothing remainder of the way for purposes of EC (min A for clothing management component) Goal status: achieved  6.  Pt will tolerate standing x1 min with close supv and BUE support to manage clothing in prep for toileting. Baseline: Eval: Currently standing in electric lift only, or within parallel bars without lift; 09/28/23: Not yet attempted; 11/13/23: Pt is able to perform static standing with heavy BUE support on parallel bars with PT; 12/19/22: Not attempted  recently d/t new intermittent clonus in BLEs; 01/24/24: Tolerated standing 1 min today at sink with min A for ascent, OT assist to block R knee from buckling. 02/28/24: tolerates up to 1 min standing, difficulty achieving stand 2/2 fatigue from PT.  Goal status: in progress  7.  Pt will increase bilat grip strength in order to securely grasp walker sufficiently for functional transfers and ambulation.  Baseline: Recert 11/13/23: TBD (PT note read following OT session, indicating pt with difficulty securing items in hand);  11/15/23: Grip strength: R: 70 lbs, L: 63 lbs; 12/25/23: R 76 lbs, L 67 lbs; sufficient for functional transfers/amb  Goal status: achieved  8.  Pt will increase L [facility] skills in order to manipulate small ADL supplies with reduced dropping as indicated by 20 sec or more improvement in 9 hole peg test. Baseline: Recert 11/13/23: 9 hole peg test TBD (PT note read following OT session, indicating pt with difficulty manipulating and securing items in hands); 11/15/23: Right: 25 sec, L 49 sec; 12/25/23: Right: 24 sec; Left: 3 trials: 54 sec, 1 min, 56 sec; 01/24/24: R: 26 sec, L: 50 sec. 02/28/24: L: 40 sec.  Goal status: in progress  ASSESSMENT: CLINICAL IMPRESSION: Avoided standing attempts/functional transfers today per pt request d/t pt reporting knees were feeling like they would buckle in PT session today.  Pt with fair tolerance to  core strengthening exercises this date; frequent rest breaks needed.  Pt demonstrates increased challenge with trunk rotations holding ball with extended elbows.  Pt did well to place pegs 1 by 1 into pegboard using L hand, with challenge increased during attempts at storing 1 peg in palm while placing a 2nd peg into board, or when storing more than 3-4 in palm at 1 time to remove pegs from board.  Pt will continue to benefit from skilled OT to work towards above noted goals in OT poc, working to maximize indep with daily tasks while reducing burden of care on  caregivers.   PERFORMANCE DEFICITS: in functional skills including ADLs, IADLs, strength, pain, flexibility, Gross motor control, mobility, balance, body mechanics, endurance, and decreased knowledge of use of DME, and psychosocial skills including coping strategies, environmental adaptation, habits, and routines and behaviors.   IMPAIRMENTS: are limiting patient from ADLs, IADLs, and social participation.   CO-MORBIDITIES: has co-morbidities such as neuropathy, back pain, obesity, MS, HTN that affects occupational performance. Patient will benefit from skilled OT to address above impairments and improve overall function.  MODIFICATION OR ASSISTANCE TO COMPLETE EVALUATION: No modification of tasks or assist necessary to complete an evaluation.  OT OCCUPATIONAL PROFILE AND HISTORY: Detailed assessment: Review of records and additional review of physical, cognitive, psychosocial history related to current functional performance.  CLINICAL DECISION MAKING: Moderate - several treatment options, min-mod task modification necessary  REHAB POTENTIAL: Good  EVALUATION COMPLEXITY: Moderate  PLAN:  OT FREQUENCY: 2x/week  OT DURATION: 12 weeks  PLANNED INTERVENTIONS: 97168 OT Re-evaluation, 97535 self care/ADL training, 02889 therapeutic exercise, 97530 therapeutic activity, 97112 neuromuscular re-education, 97140 manual therapy, 97116 gait training, 02989 moist heat, 97010 cryotherapy, balance training, functional mobility training, psychosocial skills training, energy conservation, coping strategies training, patient/family education, and DME and/or AE instructions  RECOMMENDED OTHER SERVICES: None at this time (Pt currently receiving PT services in this clinic)  CONSULTED AND AGREED WITH PLAN OF CARE: Patient  PLAN FOR NEXT SESSION: see above  Inocente MARLA Blazing, OT 03/06/2024, 8:28 PM

## 2024-03-06 NOTE — Therapy (Signed)
 OUTPATIENT PHYSICAL THERAPY NEURO TREATMENT   Patient Name: Lisa Williams MRN: [id number] DOB:11-03-1960, 63 y.o., female Today's Date: 03/06/2024  PCP: Lenon Na  REFERRING PROVIDER: Lenon Na   END OF SESSION:   Past Medical History:  Diagnosis Date   Abdominal pain, right upper quadrant    Back pain    Calculus of kidney 12/09/2013   Chronic back pain    unspecified   Chronic left shoulder pain 07/19/2015   Complication of anesthesia    Functional disorder of bladder    other   Galactorrhea 11/26/2014   Chronic    Hereditary and idiopathic neuropathy 08/19/2013   History of kidney stones    HPV test positive    Hypercholesteremia 08/19/2013   Hypertension    Incomplete bladder emptying    Microscopic hematuria    MS (multiple sclerosis)    Muscle spasticity 05/21/2014   Nonspecific findings on examination of urine    other   Osteopenia    PONV (postoperative nausea and vomiting)    Status post laparoscopic supracervical hysterectomy 11/26/2014   Tobacco user 11/26/2014   Wrist fracture    Past Surgical History:  Procedure Laterality Date   bilateral tubal ligation  1996   BREAST CYST EXCISION Left 2002   CYST EXCISION Left 05/10/2022   Procedure: CYST REMOVAL;  Surgeon: Rodolph Romano, MD;  Location: [facility];  Service: General;  Laterality: Left;   FRACTURE SURGERY     KNEE SURGERY     right   LAPAROSCOPIC SUPRACERVICAL HYSTERECTOMY  08/05/2013   ORIF WRIST FRACTURE Left 01/17/2017   Procedure: OPEN REDUCTION INTERNAL FIXATION (ORIF) WRIST FRACTURE;  Surgeon: Leora Lynwood SAUNDERS, MD;  Location: [facility];  Service: Orthopedics;  Laterality: Left;   RADIOLOGY WITH ANESTHESIA N/A 03/18/2020   Procedure: MRI WITH ANESTHESIA CERVICAL SPINE AND BRAIN  WITH AND WITHOUT CONTRAST;  Surgeon: Radiologist, Medication, MD;  Location: [facility];  Service: Radiology;  Laterality: N/A;   TUBAL LIGATION Bilateral    VAGINAL HYSTERECTOMY   03/2006   Patient Active Problem List   Diagnosis Date Noted   Hyperglycemia 01/09/2024   Abnormal LFTs (liver function tests) 09/17/2023   Sinus tachycardia 07/07/2023   Aortic atherosclerosis 01/18/2023   Prediabetes 01/18/2023   Hypokalemia 07/29/2022   Weakness of both lower extremities 07/29/2022   Abscess of left groin 11/15/2021   Abnormal LFTs 11/15/2021   Acute respiratory disease due to COVID-19 virus 11/21/2020   Weakness    Hypoalbuminemia due to protein-calorie malnutrition    Neurogenic bowel    Neurogenic bladder    Labile blood pressure    Neuropathic pain    Multiple sclerosis, relapsing-remitting 07/09/2019   Abscess of female pelvis    SVT (supraventricular tachycardia)    Radial styloid tenosynovitis 03/12/2018   Wheelchair dependence 02/27/2018   Localized osteoporosis with current pathological fracture with routine healing 01/19/2017   Wrist fracture 01/16/2017   Sprain of ankle 03/23/2016   Closed fracture of lateral malleolus 03/16/2016   Health care maintenance 01/24/2016   Blood pressure elevated without history of HTN 10/25/2015   Essential hypertension 10/25/2015   Multiple sclerosis 10/02/2015   Chronic left shoulder pain 07/19/2015   Multiple sclerosis exacerbation 07/14/2015   MS (multiple sclerosis) 11/26/2014   Increased body mass index 11/26/2014   HPV test positive 11/26/2014   Status post laparoscopic supracervical hysterectomy 11/26/2014   Galactorrhea 11/26/2014   Back ache 05/21/2014   Adiposity 05/21/2014   Disordered sleep 05/21/2014   Muscle  spasticity 05/21/2014   Spasticity 05/21/2014   Calculus of kidney 12/09/2013   Renal colic 12/09/2013   Hypercholesteremia 08/19/2013   Hereditary and idiopathic neuropathy 08/19/2013   Hypercholesterolemia without hypertriglyceridemia 08/19/2013   Bladder infection, chronic 07/25/2012   Disorder of bladder function 07/25/2012   Incomplete bladder emptying 07/25/2012   Microscopic  hematuria 07/25/2012   Right upper quadrant pain 07/25/2012    ONSET DATE: 1995  REFERRING DIAG: MS  THERAPY DIAG:  No diagnosis found.  Rationale for Evaluation and Treatment: Rehabilitation  SUBJECTIVE:                                                                                                                                                                                             SUBJECTIVE STATEMENT:  Pt reports feeling good and has trick or treat surprises for everyone.    Pt accompanied by: self  PERTINENT HISTORY:  Patient is returning to PT s/p hospitalization.  s/p ORIF of R tibia shaft fracture 09/23/2022. Patient has weakness in BLE with RLE>LLE. She drives with hand controls. Patient has been diagnosed with MS in 1995. PMH includes: back pain, CBP, chronic L shoulder pain, galactorrhea, neuropathy, HPV, hypercholestermeia, HTN, MS, osteopenia, PONV, wrist fracture. Additional order for other closed fracture of proximal end of R tibia with routine healing. Still has to use [employer].   PAIN:  Are you having pain? Occasional pain in RLE; primarily in knee  PRECAUTIONS: Fall  RED FLAGS: None   WEIGHT BEARING RESTRICTIONS: No  FALLS: Has patient fallen in last 6 months? No  LIVING ENVIRONMENT: Lives with: lives with their family Lives in: House/apartment Stairs: ramp Has following equipment at home: Vannie - 2 wheeled, Wheelchair (manual), shower chair, and Grab bars  PLOF: Independent with household mobility with device  PATIENT GOALS: to get her independence back. To be able to get into car, toilet, and get dressed independently  OBJECTIVE:  Note: Objective measures were completed at Evaluation unless otherwise noted.  DIAGNOSTIC FINDINGS: MRI of the brain 03/18/2020 showed multiple T2/FLAIR hyperintense foci in the periventricular, juxtacortical and deep white matter.  There were no infratentorial lesions noted.  None of the foci enhanced.   Due to  severe claustrophobia, the study was done with conscious sedation in the hospital   COGNITION: Overall cognitive status: Within functional limits for tasks assessed   SENSATION: Lack of sensation in feet, loss in bilateral lateral aspect of knee  COORDINATION: Does not have the strength for functional LE heel slide test   MUSCLE TONE: BLE mild tone    POSTURE: rounded shoulders, forward head, anterior pelvic tilt,  and weight shift left   LOWER EXTREMITY MMT:    MMT Right Eval Left Eval  Hip flexion 0.9 1.5  Hip extension    Hip abduction 1.8 2.6  Hip adduction 3.1 1.9  Hip internal rotation    Hip external rotation    Knee flexion 0.9 1.5  Knee extension 0.8 1.2  Ankle dorsiflexion    Ankle plantarflexion    Ankle inversion    Ankle eversion    (Blank rows = not tested)  BED MOBILITY:  Assess in future session due to limited time  TRANSFERS: Assistive device utilized: Bariatric RW with sit to stands, slide board to table  Sit to stand: unable to reach full stand Stand to sit: unable to reach full stand  Chair to chair: slide board with CGA   GAIT: Unable to ambulate at this time.   FUNCTIONAL TESTS:  Sit to stand: tricep press with BUE; unable to bring arm to walker.    Function In Sitting Test (FIST)  (1/2 femur on surface; hips/knees flexed to 90deg)   - indicate bed or mat table / step stool if used  SCORING KEY: 4 = Independent (completes task independently & successfully) 3 = Verbal Cues/Increased Time (completes task independently & successfully and only needs more time/cues) 2 = Upper Extremity Support (must use UE for support or assistance to complete successfully) 1 = Needs Assistance (unable to complete w/o physical assist; DOCUMENT LEVEL: min, mod, max) 0 = Dependent (requires complete physical assist; unable to complete successfully even w/ physical assist)  Randomly Administer Once Throughout Exam  4 - Anterior Nudge (superior sternum)  4  - Posterior Nudge (between scapular spines)  4 - Lateral Nudge (to dominant side at acromion)     4 - Static sitting (30 seconds)  4 - Sitting, shake 'no' (left and right)  4 - Sitting, eyes closed (30 seconds)   0 - Sitting, lift foot (dominant side, lift foot 1 inch twice)    2 - Pick up object from behind (object at midline, hands breadth posterior)  3 - Forward reach (use dominant arm, must complete full motion) 2 - Lateral reach (use dominant arm, clear opposite ischial tuberosity) 2 - Pick up object from floor (from between feet)   2 - Posterior scooting (move backwards 2 inches)  2 - Anterior scooting (move forward 2 inches)  2 - Lateral scooting (move to dominant side 2 inches)    TOTAL = 39/56  Notes/comments: slide board tranfer to/from table   MCD > 5 points MCID for IP REHAB > 6 points  Function In Sitting Test (FIST) 08/14/23 (1/2 femur on surface; hips/knees flexed to 90deg)   - indicate bed or mat table / step stool if used  SCORING KEY: 4 = Independent (completes task independently & successfully) 3 = Verbal Cues/Increased Time (completes task independently & successfully and only needs more time/cues) 2 = Upper Extremity Support (must use UE for support or assistance to complete successfully) 1 = Needs Assistance (unable to complete w/o physical assist; DOCUMENT LEVEL: min, mod, max) 0 = Dependent (requires complete physical assist; unable to complete successfully even w/ physical assist)  Randomly Administer Once Throughout Exam  4 - Anterior Nudge (superior sternum)  4 - Posterior Nudge (between scapular spines)  4 - Lateral Nudge (to dominant side at acromion)     4 - Static sitting (30 seconds)  4 - Sitting, shake 'no' (left and right)  4 - Sitting, eyes closed (30 seconds)  0 - Sitting, lift foot (dominant side, lift foot 1 inch twice)    4 - Pick up object from behind (object at midline, hands breadth posterior)  4 - Forward reach (use dominant  arm, must complete full motion) 4 - Lateral reach (use dominant arm, clear opposite ischial tuberosity)  - Pick up object from floor (from between feet)   3 - Posterior scooting (move backwards 2 inches)  3 - Anterior scooting (move forward 2 inches)  3 - Lateral scooting (move to dominant side 2 inches)    TOTAL = 47/56  MCD > 5 points MCID for IP REHAB > 6 points   TREATMENT DATE: 03/06/24   TherAct:  STS x 5 total attempts with 5 upright stands, but due to the R knee buckling, ambulation was discontinued   Standing frame (using the Strap stand): Patient able to push up from chair to minimally raise her bottom to allow author to position gluteal padding underneath her.    Dependent with foot straps and locking of frame.   Static stance without UE support for core and glute activation, 30 sec bouts x4   Dynamic ball toss/catch with therapist, 30 sec bouts with rest breaks in between, x3 total attempts    Gait:   Pt ambulated for one total attempt, and was able to achieve 8' in distance.  Pt attempted on several more occasions, however the R knee was buckling and pt was unable to progress any more due to the knee buckling.  Pt ultimately declined to continue.     Several seated rest breaks utilize with therapist monitoring symptoms and pt response to ambulation throughout the session    PATIENT EDUCATION: Education details: Pt educated throughout session about proper posture and technique with exercises. Improved exercise technique, movement at target joints, use of target muscles after min to mod verbal, visual, tactile cues.  Person educated: Patient Education method: Explanation, Demonstration, Tactile cues, and Verbal cues Education comprehension: verbalized understanding, returned demonstration, verbal cues required, tactile cues required, and needs further education  HOME EXERCISE PROGRAM: Stand 3x/day in stander   GOALS: Goals reviewed with patient?  Yes  SHORT TERM GOALS: Target date: 08/08/2023  Patient will be independent in home exercise program to improve strength/mobility for better functional independence with ADLs.  Baseline: 4/8: compliance Goal status: MET    LONG TERM GOALS: Target date: 03/20/2024  Patient will tolerate five consecutive stands with UE support from wheelchair to standing to improve functional mobility with additional cushion and RW.  Baseline: unable to perform 4/8:unable to perform full stand 5/20: perform next session 5/29: two full stands with CGA and cushion on her seat 7/31: able to stand 3 times from [facility]. With no addition cushion  in parallel bars. 12/27/2023= Will retest next session- concentrated on just standing on past 2 visits Goal status: Ongoing   2.  Patient will ambulate 10 ft with bRW with wheelchair follow.  Baseline:  unable to ambulate 4/8: unable to ambulate 5/20: able to ambulate length of // bars in previous session 5/27: ambulate 2.5 lengths of // bars with two seated rest breaks with min A for weight shift and wheelchair follow  7/31: able to ambulate in parallel bars 3 ft with min assist and +2 follow in WC> difficulty advancing the LLE on this day. 12/27/2023- Patient unable to take a step forward today but was abel to take a step on previous visit- limited by clonus like trembling that has progressed over past month.  Goal status: Ongoing   3.  Patient will improve FIST score >6 points to demonstrate improved stability and ability to perform ADLs.  Baseline:  3/6: 39/56 4/8: 47/ 56 5/20: 48/56 7/31:46/56 Goal status: MET  4.  Patient will perform toileting with mod I at home for improved independence.  Baseline: 3/6: requires assistance 4/8: requires dependence on machine 5/20: requires assistance with use of wipe buddy.  7/31: continues to require use of sara steady and wipe buddy for safety and hygiene. 12/27/2023- Patient reports that since she is unable to take steps that she  continues to require use of sara steady and wipe buddy for safe personal hygiene. Goal status: ongoing   5. Patient will perform > 5 min of static or dynamic standing with BUE and Min/mod assist  for improved LE strength and pre-gait function to assist with toileting and overall standing ability for improve functional independence.  Baseline: 12/27/2023-Patient was able to stand for just over 3 min today. 01/10/24: Pt able to stand 1 min 38 sec 02/14/24: Pt able to >3 minutes at this time. Goal status: PROGRESSING    ASSESSMENT:  CLINICAL IMPRESSION:  Pt not able to achieve the amount of ambulation as expected, however pt was not discouraged and willingly participated in the standing exercises to promote the weight bearing in the LE's.  Pt had good stability and was working on core stability with dynamic throws to the L and the R as therapist moved in front of the pt.   Pt will continue to benefit from skilled therapy to address remaining deficits in order to improve overall QoL and return to PLOF.          OBJECTIVE IMPAIRMENTS: Abnormal gait, cardiopulmonary status limiting activity, decreased activity tolerance, decreased balance, decreased coordination, decreased endurance, decreased mobility, difficulty walking, decreased ROM, decreased strength, hypomobility, increased fascial restrictions, impaired perceived functional ability, impaired flexibility, impaired sensation, improper body mechanics, and postural dysfunction.   ACTIVITY LIMITATIONS: carrying, lifting, bending, sitting, standing, squatting, sleeping, stairs, transfers, bed mobility, continence, bathing, toileting, dressing, self feeding, reach over head, hygiene/grooming, locomotion level, and caring for others  PARTICIPATION LIMITATIONS: meal prep, cleaning, laundry, interpersonal relationship, driving, shopping, community activity, and church  PERSONAL FACTORS: Age, Past/current experiences, Time since onset of  injury/illness/exacerbation, Transportation, and 3+ comorbidities: ack pain, CBP, chronic L shoulder pain, galactorrhea, neuropathy, HPV, hypercholestermeia, HTN, MS, osteopenia, PONV, wrist fracture are also affecting patient's functional outcome.   REHAB POTENTIAL: Good  CLINICAL DECISION MAKING: Evolving/moderate complexity  EVALUATION COMPLEXITY: Moderate  PLAN:  PT FREQUENCY: 2x/week  PT DURATION: 12 weeks  PLANNED INTERVENTIONS: 97164- PT Re-evaluation, 97110-Therapeutic exercises, 97530- Therapeutic activity, 97112- Neuromuscular re-education, 97535- Self Care, [id number]- Manual therapy, [phone]- Gait training, [phone]- Orthotic Fit/training, [phone]- Canalith repositioning, [id number]- Aquatic Therapy, [phone]- Electrical stimulation (unattended), [phone]- Electrical stimulation (manual), [id number]- Ultrasound, [id number]- Traction (mechanical), Patient/Family education, Balance training, Stair training, Taping, Dry Needling, Joint mobilization, Joint manipulation, Spinal manipulation, Spinal mobilization, Scar mobilization, Compression bandaging, Vestibular training, Visual/preceptual remediation/compensation, Cognitive remediation, DME instructions, Cryotherapy, Moist heat, and Biofeedback  PLAN FOR NEXT SESSION:   Strap stand frame with more dynamic activities Sit to stands; try supported walking, continue dynamic core stabilization training and seated reaching tasks to improve weight shifting.    Fonda Simpers, PT, DPT Physical Therapist - [facility]  03/06/24, 8:29 AM

## 2024-03-06 NOTE — Therapy (Signed)
 OUTPATIENT OCCUPATIONAL THERAPY NEURO TREATMENT NOTE  Patient Name: Lisa Williams MRN: [id number] DOB:1960/09/12, 63 y.o., female Today's Date: 03/06/2024  PCP: Dr. Layman Piety   Neurologist Dr. Suanne at [facility] REFERRING PROVIDER: Dr. Layman Piety    END OF SESSION:  OT End of Session - 03/06/24 2014     Visit Number 51    Number of Visits 67    Date for Recertification  04/29/24    Authorization Time Period Reporting period beginning 02/28/24    Progress Note Due on Visit 60    OT Start Time 1100    OT Stop Time 1140    OT Time Calculation (min) 40 min    Equipment Utilized During Treatment manual wc    Activity Tolerance Patient tolerated treatment well    Behavior During Therapy [facility] for tasks assessed/performed         Past Medical History:  Diagnosis Date   Abdominal pain, right upper quadrant    Back pain    Calculus of kidney 12/09/2013   Chronic back pain    unspecified   Chronic left shoulder pain 07/19/2015   Complication of anesthesia    Functional disorder of bladder    other   Galactorrhea 11/26/2014   Chronic    Hereditary and idiopathic neuropathy 08/19/2013   History of kidney stones    HPV test positive    Hypercholesteremia 08/19/2013   Hypertension    Incomplete bladder emptying    Microscopic hematuria    MS (multiple sclerosis)    Muscle spasticity 05/21/2014   Nonspecific findings on examination of urine    other   Osteopenia    PONV (postoperative nausea and vomiting)    Status post laparoscopic supracervical hysterectomy 11/26/2014   Tobacco user 11/26/2014   Wrist fracture    Past Surgical History:  Procedure Laterality Date   bilateral tubal ligation  1996   BREAST CYST EXCISION Left 2002   CYST EXCISION Left 05/10/2022   Procedure: CYST REMOVAL;  Surgeon: Rodolph Romano, MD;  Location: [facility] ORS;  Service: General;  Laterality: Left;   FRACTURE SURGERY     KNEE SURGERY     right   LAPAROSCOPIC  SUPRACERVICAL HYSTERECTOMY  08/05/2013   ORIF WRIST FRACTURE Left 01/17/2017   Procedure: OPEN REDUCTION INTERNAL FIXATION (ORIF) WRIST FRACTURE;  Surgeon: Leora Lynwood SAUNDERS, MD;  Location: [facility] ORS;  Service: Orthopedics;  Laterality: Left;   RADIOLOGY WITH ANESTHESIA N/A 03/18/2020   Procedure: MRI WITH ANESTHESIA CERVICAL SPINE AND BRAIN  WITH AND WITHOUT CONTRAST;  Surgeon: Radiologist, Medication, MD;  Location: [facility] OR;  Service: Radiology;  Laterality: N/A;   TUBAL LIGATION Bilateral    VAGINAL HYSTERECTOMY  03/2006   Patient Active Problem List   Diagnosis Date Noted   Hyperglycemia 01/09/2024   Abnormal LFTs (liver function tests) 09/17/2023   Sinus tachycardia 07/07/2023   Aortic atherosclerosis 01/18/2023   Prediabetes 01/18/2023   Hypokalemia 07/29/2022   Weakness of both lower extremities 07/29/2022   Abscess of left groin 11/15/2021   Abnormal LFTs 11/15/2021   Acute respiratory disease due to COVID-19 virus 11/21/2020   Weakness    Hypoalbuminemia due to protein-calorie malnutrition    Neurogenic bowel    Neurogenic bladder    Labile blood pressure    Neuropathic pain    Multiple sclerosis, relapsing-remitting 07/09/2019   Abscess of female pelvis    SVT (supraventricular tachycardia)    Radial styloid tenosynovitis 03/12/2018   Wheelchair dependence 02/27/2018   Localized  osteoporosis with current pathological fracture with routine healing 01/19/2017   Wrist fracture 01/16/2017   Sprain of ankle 03/23/2016   Closed fracture of lateral malleolus 03/16/2016   Health care maintenance 01/24/2016   Blood pressure elevated without history of HTN 10/25/2015   Essential hypertension 10/25/2015   Multiple sclerosis 10/02/2015   Chronic left shoulder pain 07/19/2015   Multiple sclerosis exacerbation 07/14/2015   MS (multiple sclerosis) 11/26/2014   Increased body mass index 11/26/2014   HPV test positive 11/26/2014   Status post laparoscopic supracervical hysterectomy  11/26/2014   Galactorrhea 11/26/2014   Back ache 05/21/2014   Adiposity 05/21/2014   Disordered sleep 05/21/2014   Muscle spasticity 05/21/2014   Spasticity 05/21/2014   Calculus of kidney 12/09/2013   Renal colic 12/09/2013   Hypercholesteremia 08/19/2013   Hereditary and idiopathic neuropathy 08/19/2013   Hypercholesterolemia without hypertriglyceridemia 08/19/2013   Bladder infection, chronic 07/25/2012   Disorder of bladder function 07/25/2012   Incomplete bladder emptying 07/25/2012   Microscopic hematuria 07/25/2012   Right upper quadrant pain 07/25/2012   ONSET DATE: 5/24 [place] and broke R leg which caused beginning of ADL decline; diagnosed with MS in 1995)  REFERRING DIAG: MS  THERAPY DIAG:  Muscle weakness (generalized)  Other lack of coordination  Multiple sclerosis exacerbation  Rationale for Evaluation and Treatment: Rehabilitation  SUBJECTIVE:  SUBJECTIVE STATEMENT: Pt reports doing well today. Pt accompanied by: self  PERTINENT HISTORY: Pt reports increased difficulty with basic self care tasks since breaking her R leg last May.  Since that time, mobility and ADLs have declined, with pt requiring assist from private caregivers and family to manage bathing, toileting, dressing, and functional transfers.  PRECAUTIONS: Fall  WEIGHT BEARING RESTRICTIONS: No  PAIN: 03/04/24: No pain today  Are you having pain? No; occasional pain in R lower back, but not today  FALLS: Has patient fallen in last 6 months? No Last fall was May 17 of 2024  LIVING ENVIRONMENT: Lives with: lives with their family (including mother who has dementia and sister [name])  Lives in: 2 level home but pt resides on main level Stairs: ramp  Has following equipment at home: Wheelchair (manual), Shower bench, and hand held shower shower, 1 grab bar in the shower, 3in1 commode, sit to stand lift (electric), FWW, hospital bed  PLOF: modified indep with ADL/IADLs prior to May of 7975    PATIENT GOALS: Increase independence with basic self care tasks  OBJECTIVE:  Note: Objective measures were completed at Evaluation unless otherwise noted.  HAND DOMINANCE: Right  ADLs:  Overall ADLs: assist provided from paid caregiver and sister Transfers/ambulation related to ADLs: sit to stand lift for all transfers, set up for sliding board in/out of bed  Eating: indep  Grooming: modified indep (wc level in mom's bathroom)  UB Dressing: set up (can't access her bedroom closet)  LB Dressing: able to sit on side of bed to don all LB clothing; assist to hike pants/underwear Toileting: assist with clothing management d/t standing within electric lift Bathing: bed bath with sister helping to wash backside Tub Shower transfers: N/A; pt has walk in shower in mom's bathroom (wc does fit in this bathroom but pt reports inability to transfer from wc and requires arm rests on a bench for a successful transfer attempt from any DME).  Tub bench is in pt's bathroom, but lift does not fit through bathroom doorway and pt reports inability to transfer up from tub bench (currently unable to manage either transfer)  Equipment: see above  IADLs: Shopping: [employer] for shopping Light housekeeping: modified indep from wc level Meal Prep: modified indep from wc level Community mobility: relies on community transportation or family members Medication management: indep Landscape architect: indep Handwriting: NT; pt denies any FMC challenges  MOBILITY STATUS: Hx of falls  POSTURE COMMENTS:  Rounded shoulders, forward head, anterior pelvic tilt, and weight shift left   ACTIVITY TOLERANCE: Eval: Activity tolerance: Per PT; currently standing 30-60 sec within Light Gait harness/lift; currently non-ambulatory  01/24/24: Standing at sink: 1 minute; min A and 5 attempts to achieve fully erect standing position with BUEs supported on sink countertop, OT assist to block R knee to prevent buckling    02/28/24: x4 attempts, achieves 75% upright standing with MIN A to block knees.   UPPER EXTREMITY ROM:  BUEs WFL  UPPER EXTREMITY MMT:     MMT Right eval Left eval  Shoulder flexion 4+ 4  Shoulder abduction 4+ 4  Shoulder adduction    Shoulder extension    Shoulder internal rotation 4+ 4+  Shoulder external rotation 4 4  Middle trapezius    Lower trapezius    Elbow flexion 4+ 4+  Elbow extension 4+ 4+  Wrist flexion 4+ 4+  Wrist extension 4+ 4+  Wrist ulnar deviation    Wrist radial deviation    Wrist pronation    Wrist supination    (Blank rows = not tested)  HAND FUNCTION/COORDINATION:  R/L WNL; pt denies any coordination deficits/ able to manipulate pills/clothing fasteners/etc without difficulty  11/15/23: *Measures taken d/t PT note revealing pt with increased difficulty manipulating smaller ADL supplies during reaching activities in PT session   11/15/23: Grip strength: R: 70 lbs, L: 63 lbs Pinch strength: Lateral: R: 16 lbs, L: 15 lbs; 3 point pinch: R: 12 lbs, L: 13 lbs 9 hole peg test: Right: 25 sec, L 49 sec   12/25/23: Grip strength: Right: 70 lbs; Left: 65 lbs  9 hole peg test: Right: 24 sec; Left: 3 trials: 54 sec, 1 min, 56 sec  01/24/24: Grip grip: Right: 76 lbs; Left: 67 lbs Lateral pinch: R: 17 lbs, L: 17 lbs; 3 point pinch: R: 13 lbs, L: 14 lbs  9 Hole Peg Test: R: 26 sec, L: 50 sec  02/28/24: Grip grip: Right: 85 lbs; Left: 71 lbs Lateral pinch: R: 20 lbs, L: 19 lbs; 3 point pinch: R: 17 lbs, L: 18 lbs  9 Hole Peg Test: R: 29 sec, L: 40 sec  SENSATION: WFL  EDEMA: No visible edema in BUEs  MUSCLE TONE: BUEs WNL  COGNITION: Overall cognitive status: Within functional limits for tasks assessed  VISION: wears glasses all the time, no reports of diplopia  PERCEPTION: Not tested  PRAXIS: WFL  OBSERVATIONS:  Pt pleasant, cooperative, and motivated to work towards improving indep with ADLs.                                                                                                      TREATMENT DATE: 03/04/24 Self Care -Sit to stand x10 trials from  elevated mat table (22.5) using RW; OT provided manual assist to block knees, and min vc for forward WS.   Therapeutic Activity: -Facilitated L hand FMC and dexterity skills working to pick up glass stones from dish, store in palm, and use translatory movements moving [place] stones between palm and fingertips in prep for discarding stones 1 at a time into dish.    PATIENT EDUCATION: Education details: STS transfer technique Person educated: Patient Education method: Explanation, Demonstration, Tactile cues, and Verbal cues Education comprehension: verbalized understanding and needs further education  HOME EXERCISE PROGRAM: IR A/AAROM to promote shoulder flexibility for peri care/bathing  GOALS: Goals reviewed with patient? Yes  SHORT TERM GOALS: Target date: 12/25/23  Pt will perform bed bath with set up only. Baseline: Eval: Min-mod A for posterior washing; 09/28/23: Min A for posterior washing; 11/13/23: Able to manage bed bath but sister helps with thoroughness, per pt's preference; pt in agreement to trial bath sitting EOB or in wc before next OT session. Goal status: achieved/d/c  2.  Pt will utilize sliding board for wc<>drop arm commode transfer with SBA. Baseline: Eval: Currently using electric lift for transfer to [facility]; 09/28/23: Not yet completed at home but transfers to commode have been easier without sliding board d/t pt implementing a scoot pivot. Goal status: d/c (pt does not prefer sliding board for commode transfer)  3.  Pt will be indep to perform HEP for maintaining BUE strength for ADLs and functional transfers. Baseline: Eval: HEP not yet initiated; 09/28/23: Pt is working on core stability exercises from wc, cane stretches for bilat shoulder IR, and tricep extension via wc pushups; 11/13/23: indep Goal status: achieved  LONG TERM GOALS: Target date:  02/05/24  Pt will perform sink bath with set up A. (Revised on 11/13/23 from min A to set up) Baseline: Eval: Currently bed bathing d/t inability to stand at sink without electric lift (lift does not fit into pt's bathroom); 09/28/23: still completing bed bath as pt is not yet able to stand; 11/13/23: Not yet attempted; pt encouraged to attempt before next OT session now that bed bath goal has been met; 12/20/23: Pt is now completing sponge bath with min A while seated EOB (caregiver assists with washing peri area d/t no bed rail to aid in R lateral lean); 01/24/24: Performing with set up on EOB, completing lateral weight shifts to wash posterior/peri area with wc in front of pt and bed rail for LUE support and foot of bed for RUE support  Goal status: achieved  2.  Pt will perform wc<>tub bench transfer with min A. Baseline: Eval: Unable; 09/28/23: Performed in OT clinic with min A, but not yet in the home Goal status: d/c (pt prefers sink bath)  3.  Pt will perform squat pivot transfer wc<>BSC with SBA.  Baseline: Eval: Eval: Currently using electric lift for transfer to [facility]; 09/28/23: Not yet completed in clinic with [facility], and using lift for [facility] in the home still; 11/13/23: Still using Camie lift at home and in clinic transfers are all scoot rather than squat pivots d/t LE weakness; 12/20/23: Pt reports that she plans to use her Camie lift until she can ambulate to her [facility] d/t limited space for wc in her desired location for [facility] at home. Goal status: d/c  4. Pt will perform scoot transfer wc<>toilet using grab bars with supv to allow toileting in community setting.  Baseline: Eval: Pt limits community outings d/t requiring use of lift for [facility] transfers;  09/28/23: Pt has performed 2 successful scoot pivot  Transfers to toilet in OT clinic with min A, extra time.  Further trials with clothing management on toilet needed, but pt can lower and hike  pants while sititng edge of mat via lateral leaning and min A to  maintain core stability; 11/13/23: Pt performs with CGA and extra time; 12/19/23: Pt  performs with close supv on most attempts, occasional CGA; efficiency is improving; 01/24/24: ongoing improvements; able to complete in fewer scoots (3 scoots each way from wc<>toilet) d/t improve engagement of Les and improved anterior weight shift; occasional min guard-supv; pt completes with therapist in clinic, but not yet in other community settings with sister, and not yet voided on toilet in community. 02/28/24: has not trialed in community, completes with SBA    Goal status: ongoing  5.  Pt will complete seated clothing management with min A to enable voiding in handicapped stall within community. Baseline: Recert 11/13/23: Pt can transfer to toilet with increased time and effort, but has not yet attempted clothing management or voiding while seated on commode in community setting.  Pt can lower pants to knees while seated in wc, but requires mod A to hike over hips from seated position; 12/20/23: Pt fully lowers panties and shorts while seated on commode with supv, can hike clothing to her her upper thighs, transfers back to wc, and then performs a tricep extension from wc level to allow caregiver to hike clothing remainder of the way for purposes of EC (min A for clothing management component) Goal status: achieved  6.  Pt will tolerate standing x1 min with close supv and BUE support to manage clothing in prep for toileting. Baseline: Eval: Currently standing in electric lift only, or within parallel bars without lift; 09/28/23: Not yet attempted; 11/13/23: Pt is able to perform static standing with heavy BUE support on parallel bars with PT; 12/19/22: Not attempted recently d/t new intermittent clonus in BLEs; 01/24/24: Tolerated standing 1 min today at sink with min A for ascent, OT assist to block R knee from buckling. 02/28/24: tolerates up to 1 min standing, difficulty achieving stand 2/2 fatigue from PT.  Goal status:  in progress  7.  Pt will increase bilat grip strength in order to securely grasp walker sufficiently for functional transfers and ambulation.  Baseline: Recert 11/13/23: TBD (PT note read following OT session, indicating pt with difficulty securing items in hand);  11/15/23: Grip strength: R: 70 lbs, L: 63 lbs; 12/25/23: R 76 lbs, L 67 lbs; sufficient for functional transfers/amb  Goal status: achieved  8.  Pt will increase L [facility] skills in order to manipulate small ADL supplies with reduced dropping as indicated by 20 sec or more improvement in 9 hole peg test. Baseline: Recert 11/13/23: 9 hole peg test TBD (PT note read following OT session, indicating pt with difficulty manipulating and securing items in hands); 11/15/23: Right: 25 sec, L 49 sec; 12/25/23: Right: 24 sec; Left: 3 trials: 54 sec, 1 min, 56 sec; 01/24/24: R: 26 sec, L: 50 sec. 02/28/24: L: 40 sec.  Goal status: in progress  ASSESSMENT: CLINICAL IMPRESSION: Fatigued today from PT, but able to achieve ~50% of erect standing position from elevated mat table on 3/10 STS trials.  L hand [facility] skills showing improvements, noting fewer dropped stones during attempts at storing 5-6 at a time in hand.  Pt will continue to benefit from skilled OT to work towards above noted goals in OT [place], working  to maximize indep with daily tasks while reducing burden of care on caregivers.   PERFORMANCE DEFICITS: in functional skills including ADLs, IADLs, strength, pain, flexibility, Gross motor control, mobility, balance, body mechanics, endurance, and decreased knowledge of use of DME, and psychosocial skills including coping strategies, environmental adaptation, habits, and routines and behaviors.   IMPAIRMENTS: are limiting patient from ADLs, IADLs, and social participation.   CO-MORBIDITIES: has co-morbidities such as neuropathy, back pain, obesity, MS, HTN that affects occupational performance. Patient will benefit from skilled OT to address above  impairments and improve overall function.  MODIFICATION OR ASSISTANCE TO COMPLETE EVALUATION: No modification of tasks or assist necessary to complete an evaluation.  OT OCCUPATIONAL PROFILE AND HISTORY: Detailed assessment: Review of records and additional review of physical, cognitive, psychosocial history related to current functional performance.  CLINICAL DECISION MAKING: Moderate - several treatment options, min-mod task modification necessary  REHAB POTENTIAL: Good  EVALUATION COMPLEXITY: Moderate  PLAN:  OT FREQUENCY: 2x/week  OT DURATION: 12 weeks  PLANNED INTERVENTIONS: 97168 OT Re-evaluation, 97535 self care/ADL training, 02889 therapeutic exercise, 97530 therapeutic activity, 97112 neuromuscular re-education, 97140 manual therapy, 97116 gait training, 02989 moist heat, 97010 cryotherapy, balance training, functional mobility training, psychosocial skills training, energy conservation, coping strategies training, patient/family education, and DME and/or AE instructions  RECOMMENDED OTHER SERVICES: None at this time (Pt currently receiving PT services in this clinic)  CONSULTED AND AGREED WITH PLAN OF CARE: Patient  PLAN FOR NEXT SESSION: see above  Inocente MARLA Blazing, OT 03/06/2024, 8:17 PM

## 2024-03-11 ENCOUNTER — Ambulatory Visit: Payer: PPO | Attending: Neurology

## 2024-03-11 ENCOUNTER — Ambulatory Visit

## 2024-03-11 DIAGNOSIS — R278 Other lack of coordination: Secondary | ICD-10-CM | POA: Insufficient documentation

## 2024-03-11 DIAGNOSIS — R2689 Other abnormalities of gait and mobility: Secondary | ICD-10-CM | POA: Insufficient documentation

## 2024-03-11 DIAGNOSIS — R269 Unspecified abnormalities of gait and mobility: Secondary | ICD-10-CM | POA: Diagnosis not present

## 2024-03-11 DIAGNOSIS — G35D Multiple sclerosis, unspecified: Secondary | ICD-10-CM | POA: Insufficient documentation

## 2024-03-11 DIAGNOSIS — M6281 Muscle weakness (generalized): Secondary | ICD-10-CM | POA: Insufficient documentation

## 2024-03-11 DIAGNOSIS — R2681 Unsteadiness on feet: Secondary | ICD-10-CM | POA: Insufficient documentation

## 2024-03-11 DIAGNOSIS — R262 Difficulty in walking, not elsewhere classified: Secondary | ICD-10-CM | POA: Diagnosis not present

## 2024-03-11 NOTE — Therapy (Signed)
 OUTPATIENT PHYSICAL THERAPY NEURO TREATMENT   Patient Name: Lisa Williams MRN: [id number] DOB:March 22, 1961, 63 y.o., female Today's Date: 03/11/2024  PCP: Lenon Na  REFERRING PROVIDER: Lenon Na   END OF SESSION:  PT End of Session - 03/11/24 1056     Visit Number 67    Number of Visits 70    Date for Recertification  03/20/24   corrected   Progress Note Due on Visit 50    PT Start Time 1013    PT Stop Time 1100    PT Time Calculation (min) 47 min    Equipment Utilized During Treatment Gait belt    Activity Tolerance Patient tolerated treatment well;No increased pain    Behavior During Therapy [facility] for tasks assessed/performed          Past Medical History:  Diagnosis Date   Abdominal pain, right upper quadrant    Back pain    Calculus of kidney 12/09/2013   Chronic back pain    unspecified   Chronic left shoulder pain 07/19/2015   Complication of anesthesia    Functional disorder of bladder    other   Galactorrhea 11/26/2014   Chronic    Hereditary and idiopathic neuropathy 08/19/2013   History of kidney stones    HPV test positive    Hypercholesteremia 08/19/2013   Hypertension    Incomplete bladder emptying    Microscopic hematuria    MS (multiple sclerosis)    Muscle spasticity 05/21/2014   Nonspecific findings on examination of urine    other   Osteopenia    PONV (postoperative nausea and vomiting)    Status post laparoscopic supracervical hysterectomy 11/26/2014   Tobacco user 11/26/2014   Wrist fracture    Past Surgical History:  Procedure Laterality Date   bilateral tubal ligation  1996   BREAST CYST EXCISION Left 2002   CYST EXCISION Left 05/10/2022   Procedure: CYST REMOVAL;  Surgeon: Rodolph Romano, MD;  Location: [facility] ORS;  Service: General;  Laterality: Left;   FRACTURE SURGERY     KNEE SURGERY     right   LAPAROSCOPIC SUPRACERVICAL HYSTERECTOMY  08/05/2013   ORIF WRIST FRACTURE Left 01/17/2017    Procedure: OPEN REDUCTION INTERNAL FIXATION (ORIF) WRIST FRACTURE;  Surgeon: Leora Lynwood SAUNDERS, MD;  Location: [facility] ORS;  Service: Orthopedics;  Laterality: Left;   RADIOLOGY WITH ANESTHESIA N/A 03/18/2020   Procedure: MRI WITH ANESTHESIA CERVICAL SPINE AND BRAIN  WITH AND WITHOUT CONTRAST;  Surgeon: Radiologist, Medication, MD;  Location: [facility] OR;  Service: Radiology;  Laterality: N/A;   TUBAL LIGATION Bilateral    VAGINAL HYSTERECTOMY  03/2006   Patient Active Problem List   Diagnosis Date Noted   Hyperglycemia 01/09/2024   Abnormal LFTs (liver function tests) 09/17/2023   Sinus tachycardia 07/07/2023   Aortic atherosclerosis 01/18/2023   Prediabetes 01/18/2023   Hypokalemia 07/29/2022   Weakness of both lower extremities 07/29/2022   Abscess of left groin 11/15/2021   Abnormal LFTs 11/15/2021   Acute respiratory disease due to COVID-19 virus 11/21/2020   Weakness    Hypoalbuminemia due to protein-calorie malnutrition    Neurogenic bowel    Neurogenic bladder    Labile blood pressure    Neuropathic pain    Multiple sclerosis, relapsing-remitting 07/09/2019   Abscess of female pelvis    SVT (supraventricular tachycardia)    Radial styloid tenosynovitis 03/12/2018   Wheelchair dependence 02/27/2018   Localized osteoporosis with current pathological fracture with routine healing 01/19/2017   Wrist fracture 01/16/2017  Sprain of ankle 03/23/2016   Closed fracture of lateral malleolus 03/16/2016   Health care maintenance 01/24/2016   Blood pressure elevated without history of HTN 10/25/2015   Essential hypertension 10/25/2015   Multiple sclerosis 10/02/2015   Chronic left shoulder pain 07/19/2015   Multiple sclerosis exacerbation 07/14/2015   MS (multiple sclerosis) 11/26/2014   Increased body mass index 11/26/2014   HPV test positive 11/26/2014   Status post laparoscopic supracervical hysterectomy 11/26/2014   Galactorrhea 11/26/2014   Back ache 05/21/2014   Adiposity  05/21/2014   Disordered sleep 05/21/2014   Muscle spasticity 05/21/2014   Spasticity 05/21/2014   Calculus of kidney 12/09/2013   Renal colic 12/09/2013   Hypercholesteremia 08/19/2013   Hereditary and idiopathic neuropathy 08/19/2013   Hypercholesterolemia without hypertriglyceridemia 08/19/2013   Bladder infection, chronic 07/25/2012   Disorder of bladder function 07/25/2012   Incomplete bladder emptying 07/25/2012   Microscopic hematuria 07/25/2012   Right upper quadrant pain 07/25/2012    ONSET DATE: 1995  REFERRING DIAG: MS  THERAPY DIAG:  Muscle weakness (generalized)  Other lack of coordination  Multiple sclerosis exacerbation  Difficulty in walking, not elsewhere classified  Unsteadiness on feet  Abnormality of gait and mobility  Other abnormalities of gait and mobility  Rationale for Evaluation and Treatment: Rehabilitation  SUBJECTIVE:                                                                                                                                                                                             SUBJECTIVE STATEMENT:  Pt reports feeling good and has trick or treat surprises for everyone.    Pt accompanied by: self  PERTINENT HISTORY:  Patient is returning to PT s/p hospitalization.  s/p ORIF of R tibia shaft fracture 09/23/2022. Patient has weakness in BLE with RLE>LLE. She drives with hand controls. Patient has been diagnosed with MS in 1995. PMH includes: back pain, CBP, chronic L shoulder pain, galactorrhea, neuropathy, HPV, hypercholestermeia, HTN, MS, osteopenia, PONV, wrist fracture. Additional order for other closed fracture of proximal end of R tibia with routine healing. Still has to use [employer].   PAIN:  Are you having pain? Occasional pain in RLE; primarily in knee  PRECAUTIONS: Fall  RED FLAGS: None   WEIGHT BEARING RESTRICTIONS: No  FALLS: Has patient fallen in last 6 months? No  LIVING ENVIRONMENT: Lives  with: lives with their family Lives in: House/apartment Stairs: ramp Has following equipment at home: Vannie - 2 wheeled, Wheelchair (manual), shower chair, and Grab bars  PLOF: Independent with household mobility with device  PATIENT GOALS: to get her independence back. To  be able to get into car, toilet, and get dressed independently  OBJECTIVE:  Note: Objective measures were completed at Evaluation unless otherwise noted.  DIAGNOSTIC FINDINGS: MRI of the brain 03/18/2020 showed multiple T2/FLAIR hyperintense foci in the periventricular, juxtacortical and deep white matter.  There were no infratentorial lesions noted.  None of the foci enhanced.   Due to severe claustrophobia, the study was done with conscious sedation in the hospital   COGNITION: Overall cognitive status: Within functional limits for tasks assessed   SENSATION: Lack of sensation in feet, loss in bilateral lateral aspect of knee  COORDINATION: Does not have the strength for functional LE heel slide test   MUSCLE TONE: BLE mild tone    POSTURE: rounded shoulders, forward head, anterior pelvic tilt, and weight shift left   LOWER EXTREMITY MMT:    MMT Right Eval Left Eval  Hip flexion 0.9 1.5  Hip extension    Hip abduction 1.8 2.6  Hip adduction 3.1 1.9  Hip internal rotation    Hip external rotation    Knee flexion 0.9 1.5  Knee extension 0.8 1.2  Ankle dorsiflexion    Ankle plantarflexion    Ankle inversion    Ankle eversion    (Blank rows = not tested)  BED MOBILITY:  Assess in future session due to limited time  TRANSFERS: Assistive device utilized: Bariatric RW with sit to stands, slide board to table  Sit to stand: unable to reach full stand Stand to sit: unable to reach full stand  Chair to chair: slide board with CGA   GAIT: Unable to ambulate at this time.   FUNCTIONAL TESTS:  Sit to stand: tricep press with BUE; unable to bring arm to walker.    Function In Sitting Test (FIST)   (1/2 femur on surface; hips/knees flexed to 90deg)   - indicate bed or mat table / step stool if used  SCORING KEY: 4 = Independent (completes task independently & successfully) 3 = Verbal Cues/Increased Time (completes task independently & successfully and only needs more time/cues) 2 = Upper Extremity Support (must use UE for support or assistance to complete successfully) 1 = Needs Assistance (unable to complete w/o physical assist; DOCUMENT LEVEL: min, mod, max) 0 = Dependent (requires complete physical assist; unable to complete successfully even w/ physical assist)  Randomly Administer Once Throughout Exam  4 - Anterior Nudge (superior sternum)  4 - Posterior Nudge (between scapular spines)  4 - Lateral Nudge (to dominant side at acromion)     4 - Static sitting (30 seconds)  4 - Sitting, shake 'no' (left and right)  4 - Sitting, eyes closed (30 seconds)   0 - Sitting, lift foot (dominant side, lift foot 1 inch twice)    2 - Pick up object from behind (object at midline, hands breadth posterior)  3 - Forward reach (use dominant arm, must complete full motion) 2 - Lateral reach (use dominant arm, clear opposite ischial tuberosity) 2 - Pick up object from floor (from between feet)   2 - Posterior scooting (move backwards 2 inches)  2 - Anterior scooting (move forward 2 inches)  2 - Lateral scooting (move to dominant side 2 inches)    TOTAL = 39/56  Notes/comments: slide board tranfer to/from table   MCD > 5 points MCID for IP REHAB > 6 points  Function In Sitting Test (FIST) 08/14/23 (1/2 femur on surface; hips/knees flexed to 90deg)   - indicate bed or mat table / step  stool if used  SCORING KEY: 4 = Independent (completes task independently & successfully) 3 = Verbal Cues/Increased Time (completes task independently & successfully and only needs more time/cues) 2 = Upper Extremity Support (must use UE for support or assistance to complete successfully) 1 = Needs  Assistance (unable to complete w/o physical assist; DOCUMENT LEVEL: min, mod, max) 0 = Dependent (requires complete physical assist; unable to complete successfully even w/ physical assist)  Randomly Administer Once Throughout Exam  4 - Anterior Nudge (superior sternum)  4 - Posterior Nudge (between scapular spines)  4 - Lateral Nudge (to dominant side at acromion)     4 - Static sitting (30 seconds)  4 - Sitting, shake 'no' (left and right)  4 - Sitting, eyes closed (30 seconds)   0 - Sitting, lift foot (dominant side, lift foot 1 inch twice)    4 - Pick up object from behind (object at midline, hands breadth posterior)  4 - Forward reach (use dominant arm, must complete full motion) 4 - Lateral reach (use dominant arm, clear opposite ischial tuberosity)  - Pick up object from floor (from between feet)   3 - Posterior scooting (move backwards 2 inches)  3 - Anterior scooting (move forward 2 inches)  3 - Lateral scooting (move to dominant side 2 inches)    TOTAL = 47/56  MCD > 5 points MCID for IP REHAB > 6 points   TREATMENT DATE: 03/11/24   TherAct:   Transfer from wheelchair to plinth via sliding board   Rolling to side on plinth for donning of the support device for the LiteGait, x2 each direction    Gait:   Pt was able to ambulate for 10 total minutes on the treadmill with the use of the LiteGait and min-modA +2 for LE progression.  Pt able to ambulate a total of 648' (0.12 mi) in those 10 minutes.  Pt performed the ambulation attempt in 4 separate sessions amounting to the 10 minutes.  First attempt: 2 min; second attempt: 2 min 49 sec; third attempt: 1 min 11 sec; fourth attempt: 3 min 30 sec.  Pt with standing rest breaks in between bouts and vitals monitored throughout.      PATIENT EDUCATION: Education details: Pt educated throughout session about proper posture and technique with exercises. Improved exercise technique, movement at target joints, use of  target muscles after min to mod verbal, visual, tactile cues.  Person educated: Patient Education method: Explanation, Demonstration, Tactile cues, and Verbal cues Education comprehension: verbalized understanding, returned demonstration, verbal cues required, tactile cues required, and needs further education  HOME EXERCISE PROGRAM: Stand 3x/day in stander   GOALS: Goals reviewed with patient? Yes  SHORT TERM GOALS: Target date: 08/08/2023  Patient will be independent in home exercise program to improve strength/mobility for better functional independence with ADLs.  Baseline: 4/8: compliance Goal status: MET    LONG TERM GOALS: Target date: 03/20/2024  Patient will tolerate five consecutive stands with UE support from wheelchair to standing to improve functional mobility with additional cushion and RW.  Baseline: unable to perform 4/8:unable to perform full stand 5/20: perform next session 5/29: two full stands with CGA and cushion on her seat 7/31: able to stand 3 times from [facility]. With no addition cushion  in parallel bars. 12/27/2023= Will retest next session- concentrated on just standing on past 2 visits Goal status: Ongoing   2.  Patient will ambulate 10 ft with bRW with wheelchair follow.  Baseline:  unable to ambulate 4/8: unable to ambulate 5/20: able to ambulate length of // bars in previous session 5/27: ambulate 2.5 lengths of // bars with two seated rest breaks with min A for weight shift and wheelchair follow  7/31: able to ambulate in parallel bars 3 ft with min assist and +2 follow in WC> difficulty advancing the LLE on this day. 12/27/2023- Patient unable to take a step forward today but was abel to take a step on previous visit- limited by clonus like trembling that has progressed over past month. Goal status: Ongoing   3.  Patient will improve FIST score >6 points to demonstrate improved stability and ability to perform ADLs.  Baseline:  3/6: 39/56 4/8: 47/ 56 5/20:  48/56 7/31:46/56 Goal status: MET  4.  Patient will perform toileting with mod I at home for improved independence.  Baseline: 3/6: requires assistance 4/8: requires dependence on machine 5/20: requires assistance with use of wipe buddy.  7/31: continues to require use of sara steady and wipe buddy for safety and hygiene. 12/27/2023- Patient reports that since she is unable to take steps that she continues to require use of sara steady and wipe buddy for safe personal hygiene. Goal status: ongoing   5. Patient will perform > 5 min of static or dynamic standing with BUE and Min/mod assist  for improved LE strength and pre-gait function to assist with toileting and overall standing ability for improve functional independence.  Baseline: 12/27/2023-Patient was able to stand for just over 3 min today. 01/10/24: Pt able to stand 1 min 38 sec 02/14/24: Pt able to >3 minutes at this time. Goal status: PROGRESSING    ASSESSMENT:  CLINICAL IMPRESSION:  Pt noted to be fatigued after the end of the session with her walking more than she has in previous sessions.  Pt also noted the comfort level of the harness to be much better this attempt.  Pt made good progress and noted to have some increased clonus at the end of the session when returning to the chair.   Pt will continue to benefit from skilled therapy to address remaining deficits in order to improve overall QoL and return to PLOF.      OBJECTIVE IMPAIRMENTS: Abnormal gait, cardiopulmonary status limiting activity, decreased activity tolerance, decreased balance, decreased coordination, decreased endurance, decreased mobility, difficulty walking, decreased ROM, decreased strength, hypomobility, increased fascial restrictions, impaired perceived functional ability, impaired flexibility, impaired sensation, improper body mechanics, and postural dysfunction.   ACTIVITY LIMITATIONS: carrying, lifting, bending, sitting, standing, squatting, sleeping,  stairs, transfers, bed mobility, continence, bathing, toileting, dressing, self feeding, reach over head, hygiene/grooming, locomotion level, and caring for others  PARTICIPATION LIMITATIONS: meal prep, cleaning, laundry, interpersonal relationship, driving, shopping, community activity, and church  PERSONAL FACTORS: Age, Past/current experiences, Time since onset of injury/illness/exacerbation, Transportation, and 3+ comorbidities: ack pain, CBP, chronic L shoulder pain, galactorrhea, neuropathy, HPV, hypercholestermeia, HTN, MS, osteopenia, PONV, wrist fracture are also affecting patient's functional outcome.   REHAB POTENTIAL: Good  CLINICAL DECISION MAKING: Evolving/moderate complexity  EVALUATION COMPLEXITY: Moderate  PLAN:  PT FREQUENCY: 2x/week  PT DURATION: 12 weeks  PLANNED INTERVENTIONS: 97164- PT Re-evaluation, 97110-Therapeutic exercises, 97530- Therapeutic activity, 97112- Neuromuscular re-education, 97535- Self Care, [id number]- Manual therapy, [phone]- Gait training, [phone]- Orthotic Fit/training, [phone]- Canalith repositioning, [id number]- Aquatic Therapy, [phone]- Electrical stimulation (unattended), [phone]- Electrical stimulation (manual), [id number]- Ultrasound, [id number]- Traction (mechanical), Patient/Family education, Balance training, Stair training, Taping, Dry Needling, Joint mobilization, Joint manipulation, Spinal manipulation, Spinal mobilization, Scar mobilization, Compression  bandaging, Vestibular training, Visual/preceptual remediation/compensation, Cognitive remediation, DME instructions, Cryotherapy, Moist heat, and Biofeedback  PLAN FOR NEXT SESSION:  Strap stand frame with more dynamic activities Sit to stands; try supported walking, continue dynamic core stabilization training and seated reaching tasks to improve weight shifting.    Fonda Simpers, PT, DPT Physical Therapist - [facility]  03/11/24, 1:37 PM

## 2024-03-11 NOTE — Therapy (Signed)
 OUTPATIENT OCCUPATIONAL THERAPY NEURO TREATMENT NOTE  Patient Name: Lisa Williams MRN: [id number] DOB:[birth year], 63 y.o., female Today's Date: 03/11/2024  PCP: Dr. Layman Piety   Neurologist Dr. Suanne at [facility] REFERRING PROVIDER: Dr. Layman Piety    END OF SESSION:  OT End of Session - 03/11/24 1424     Visit Number 53    Number of Visits 67    Date for Recertification  04/29/24    Authorization Time Period Reporting period beginning 02/28/24    Progress Note Due on Visit 60    OT Start Time 1100    OT Stop Time 1140    OT Time Calculation (min) 40 min    Equipment Utilized During Treatment manual wc    Activity Tolerance Patient tolerated treatment well    Behavior During Therapy [facility] for tasks assessed/performed         Past Medical History:  Diagnosis Date   Abdominal pain, right upper quadrant    Back pain    Calculus of kidney 12/09/2013   Chronic back pain    unspecified   Chronic left shoulder pain 07/19/2015   Complication of anesthesia    Functional disorder of bladder    other   Galactorrhea 11/26/2014   Chronic    Hereditary and idiopathic neuropathy 08/19/2013   History of kidney stones    HPV test positive    Hypercholesteremia 08/19/2013   Hypertension    Incomplete bladder emptying    Microscopic hematuria    MS (multiple sclerosis)    Muscle spasticity 05/21/2014   Nonspecific findings on examination of urine    other   Osteopenia    PONV (postoperative nausea and vomiting)    Status post laparoscopic supracervical hysterectomy 11/26/2014   Tobacco user 11/26/2014   Wrist fracture    Past Surgical History:  Procedure Laterality Date   bilateral tubal ligation  1996   BREAST CYST EXCISION Left 2002   CYST EXCISION Left 05/10/2022   Procedure: CYST REMOVAL;  Surgeon: Rodolph Romano, MD;  Location: [facility] ORS;  Service: General;  Laterality: Left;   FRACTURE SURGERY     KNEE SURGERY     right   LAPAROSCOPIC  SUPRACERVICAL HYSTERECTOMY  08/05/2013   ORIF WRIST FRACTURE Left 01/17/2017   Procedure: OPEN REDUCTION INTERNAL FIXATION (ORIF) WRIST FRACTURE;  Surgeon: Leora Lynwood SAUNDERS, MD;  Location: [facility] ORS;  Service: Orthopedics;  Laterality: Left;   RADIOLOGY WITH ANESTHESIA N/A 03/18/2020   Procedure: MRI WITH ANESTHESIA CERVICAL SPINE AND BRAIN  WITH AND WITHOUT CONTRAST;  Surgeon: Radiologist, Medication, MD;  Location: [facility] OR;  Service: Radiology;  Laterality: N/A;   TUBAL LIGATION Bilateral    VAGINAL HYSTERECTOMY  03/2006   Patient Active Problem List   Diagnosis Date Noted   Hyperglycemia 01/09/2024   Abnormal LFTs (liver function tests) 09/17/2023   Sinus tachycardia 07/07/2023   Aortic atherosclerosis 01/18/2023   Prediabetes 01/18/2023   Hypokalemia 07/29/2022   Weakness of both lower extremities 07/29/2022   Abscess of left groin 11/15/2021   Abnormal LFTs 11/15/2021   Acute respiratory disease due to COVID-19 virus 11/21/2020   Weakness    Hypoalbuminemia due to protein-calorie malnutrition    Neurogenic bowel    Neurogenic bladder    Labile blood pressure    Neuropathic pain    Multiple sclerosis, relapsing-remitting 07/09/2019   Abscess of female pelvis    SVT (supraventricular tachycardia)    Radial styloid tenosynovitis 03/12/2018   Wheelchair dependence 02/27/2018   Localized  osteoporosis with current pathological fracture with routine healing 01/19/2017   Wrist fracture 01/16/2017   Sprain of ankle 03/23/2016   Closed fracture of lateral malleolus 03/16/2016   Health care maintenance 01/24/2016   Blood pressure elevated without history of HTN 10/25/2015   Essential hypertension 10/25/2015   Multiple sclerosis 10/02/2015   Chronic left shoulder pain 07/19/2015   Multiple sclerosis exacerbation 07/14/2015   MS (multiple sclerosis) 11/26/2014   Increased body mass index 11/26/2014   HPV test positive 11/26/2014   Status post laparoscopic supracervical hysterectomy  11/26/2014   Galactorrhea 11/26/2014   Back ache 05/21/2014   Adiposity 05/21/2014   Disordered sleep 05/21/2014   Muscle spasticity 05/21/2014   Spasticity 05/21/2014   Calculus of kidney 12/09/2013   Renal colic 12/09/2013   Hypercholesteremia 08/19/2013   Hereditary and idiopathic neuropathy 08/19/2013   Hypercholesterolemia without hypertriglyceridemia 08/19/2013   Bladder infection, chronic 07/25/2012   Disorder of bladder function 07/25/2012   Incomplete bladder emptying 07/25/2012   Microscopic hematuria 07/25/2012   Right upper quadrant pain 07/25/2012   ONSET DATE: 5/24 Amon and broke R leg which caused beginning of ADL decline; diagnosed with MS in 1995)  REFERRING DIAG: MS  THERAPY DIAG:  Muscle weakness (generalized)  Other lack of coordination  Multiple sclerosis exacerbation  Rationale for Evaluation and Treatment: Rehabilitation  SUBJECTIVE:  SUBJECTIVE STATEMENT: Pt reports that she was able to get her standing frame delivered to her home last week, but the wrong sling was ordered, which caused the frame to tilt.  Pt reports the correct piece should be delivered some time this week.  Pt accompanied by: self  PERTINENT HISTORY: Pt reports increased difficulty with basic self care tasks since breaking her R leg last May.  Since that time, mobility and ADLs have declined, with pt requiring assist from private caregivers and family to manage bathing, toileting, dressing, and functional transfers.  PRECAUTIONS: Fall  WEIGHT BEARING RESTRICTIONS: No  PAIN: 03/11/24: No pain today  Are you having pain? No; occasional pain in R lower back, but not today  FALLS: Has patient fallen in last 6 months? No Last fall was May 17 of 2024  LIVING ENVIRONMENT: Lives with: lives with their family (including mother who has dementia and sister [name])  Lives in: 2 level home but pt resides on main level Stairs: ramp  Has following equipment at home: Wheelchair (manual),  Shower bench, and hand held shower shower, 1 grab bar in the shower, 3in1 commode, sit to stand lift (electric), FWW, hospital bed  PLOF: modified indep with ADL/IADLs prior to May of 7975   PATIENT GOALS: Increase independence with basic self care tasks  OBJECTIVE:  Note: Objective measures were completed at Evaluation unless otherwise noted.  HAND DOMINANCE: Right  ADLs:  Overall ADLs: assist provided from paid caregiver and sister Transfers/ambulation related to ADLs: sit to stand lift for all transfers, set up for sliding board in/out of bed  Eating: indep  Grooming: modified indep (wc level in mom's bathroom)  UB Dressing: set up (can't access her bedroom closet)  LB Dressing: able to sit on side of bed to don all LB clothing; assist to hike pants/underwear Toileting: assist with clothing management d/t standing within electric lift Bathing: bed bath with sister helping to wash backside Tub Shower transfers: N/A; pt has walk in shower in mom's bathroom (wc does fit in this bathroom but pt reports inability to transfer from wc and requires arm rests on a bench for a  successful transfer attempt from any DME).  Tub bench is in pt's bathroom, but lift does not fit through bathroom doorway and pt reports inability to transfer up from tub bench (currently unable to manage either transfer)   Equipment: see above  IADLs: Shopping: [employer] for shopping Light housekeeping: modified indep from wc level Meal Prep: modified indep from wc level Community mobility: relies on community transportation or family members Medication management: indep Landscape architect: indep Handwriting: NT; pt denies any FMC challenges  MOBILITY STATUS: Hx of falls  POSTURE COMMENTS:  Rounded shoulders, forward head, anterior pelvic tilt, and weight shift left   ACTIVITY TOLERANCE: Eval: Activity tolerance: Per PT; currently standing 30-60 sec within Light Gait harness/lift; currently  non-ambulatory  01/24/24: Standing at sink: 1 minute; min A and 5 attempts to achieve fully erect standing position with BUEs supported on sink countertop, OT assist to block R knee to prevent buckling   02/28/24: x4 attempts, achieves 75% upright standing with MIN A to block knees.   UPPER EXTREMITY ROM:  BUEs WFL  UPPER EXTREMITY MMT:     MMT Right eval Left eval  Shoulder flexion 4+ 4  Shoulder abduction 4+ 4  Shoulder adduction    Shoulder extension    Shoulder internal rotation 4+ 4+  Shoulder external rotation 4 4  Middle trapezius    Lower trapezius    Elbow flexion 4+ 4+  Elbow extension 4+ 4+  Wrist flexion 4+ 4+  Wrist extension 4+ 4+  Wrist ulnar deviation    Wrist radial deviation    Wrist pronation    Wrist supination    (Blank rows = not tested)  HAND FUNCTION/COORDINATION:  R/L WNL; pt denies any coordination deficits/ able to manipulate pills/clothing fasteners/etc without difficulty  11/15/23: *Measures taken d/t PT note revealing pt with increased difficulty manipulating smaller ADL supplies during reaching activities in PT session   11/15/23: Grip strength: R: 70 lbs, L: 63 lbs Pinch strength: Lateral: R: 16 lbs, L: 15 lbs; 3 point pinch: R: 12 lbs, L: 13 lbs 9 hole peg test: Right: 25 sec, L 49 sec   12/25/23: Grip strength: Right: 70 lbs; Left: 65 lbs  9 hole peg test: Right: 24 sec; Left: 3 trials: 54 sec, 1 min, 56 sec  01/24/24: Grip grip: Right: 76 lbs; Left: 67 lbs Lateral pinch: R: 17 lbs, L: 17 lbs; 3 point pinch: R: 13 lbs, L: 14 lbs  9 Hole Peg Test: R: 26 sec, L: 50 sec  02/28/24: Grip grip: Right: 85 lbs; Left: 71 lbs Lateral pinch: R: 20 lbs, L: 19 lbs; 3 point pinch: R: 17 lbs, L: 18 lbs  9 Hole Peg Test: R: 29 sec, L: 40 sec  SENSATION: WFL  EDEMA: No visible edema in BUEs  MUSCLE TONE: BUEs WNL  COGNITION: Overall cognitive status: Within functional limits for tasks assessed  VISION: wears glasses all the time, no reports of  diplopia  PERCEPTION: Not tested  PRAXIS: WFL  OBSERVATIONS:  Pt pleasant, cooperative, and motivated to work towards improving indep with ADLs.  TREATMENT DATE: 03/11/24 Self Care: -STS trials at sink, with and without RW; min A to block R knee and to assist with forward WS.  Pt was able to achieve full erect standing position with RW for 2 min, min A for stability and blocking R knee, and vc for erect posture/squeezing buttocks, core, and quads.  Therapeutic Activity: -Facilitated L hand FMC/dexterity skills working to place grooved pegs into pegboard.  Pt practiced repositioning pegs within thumb and fingertips, working to minimize use of pegboard or table top for stability of peg while repositioning.  Pt practiced removing a row of pegs, storing pegs in palm, then discarding pegs into dish.  PATIENT EDUCATION: Education details: STS technique/standing posture Person educated: Patient Education method: Explanation, Actor cues, and Verbal cues Education comprehension: verbalized understanding and returned demonstration  HOME EXERCISE PROGRAM: IR A/AAROM to promote shoulder flexibility for peri care/bathing  GOALS: Goals reviewed with patient? Yes  SHORT TERM GOALS: Target date: 12/25/23  Pt will perform bed bath with set up only. Baseline: Eval: Min-mod A for posterior washing; 09/28/23: Min A for posterior washing; 11/13/23: Able to manage bed bath but sister helps with thoroughness, per pt's preference; pt in agreement to trial bath sitting EOB or in wc before next OT session. Goal status: achieved/d/c  2.  Pt will utilize sliding board for wc<>drop arm commode transfer with SBA. Baseline: Eval: Currently using electric lift for transfer to [facility]; 09/28/23: Not yet completed at home but transfers to commode have been easier without sliding board d/t pt implementing a scoot pivot. Goal  status: d/c (pt does not prefer sliding board for commode transfer)  3.  Pt will be indep to perform HEP for maintaining BUE strength for ADLs and functional transfers. Baseline: Eval: HEP not yet initiated; 09/28/23: Pt is working on core stability exercises from wc, cane stretches for bilat shoulder IR, and tricep extension via wc pushups; 11/13/23: indep Goal status: achieved  LONG TERM GOALS: Target date: 02/05/24  Pt will perform sink bath with set up A. (Revised on 11/13/23 from min A to set up) Baseline: Eval: Currently bed bathing d/t inability to stand at sink without electric lift (lift does not fit into pt's bathroom); 09/28/23: still completing bed bath as pt is not yet able to stand; 11/13/23: Not yet attempted; pt encouraged to attempt before next OT session now that bed bath goal has been met; 12/20/23: Pt is now completing sponge bath with min A while seated EOB (caregiver assists with washing peri area d/t no bed rail to aid in R lateral lean); 01/24/24: Performing with set up on EOB, completing lateral weight shifts to wash posterior/peri area with wc in front of pt and bed rail for LUE support and foot of bed for RUE support  Goal status: achieved  2.  Pt will perform wc<>tub bench transfer with min A. Baseline: Eval: Unable; 09/28/23: Performed in OT clinic with min A, but not yet in the home Goal status: d/c (pt prefers sink bath)  3.  Pt will perform squat pivot transfer wc<>BSC with SBA.  Baseline: Eval: Eval: Currently using electric lift for transfer to [facility]; 09/28/23: Not yet completed in clinic with [facility], and using lift for [facility] in the home still; 11/13/23: Still using Camie lift at home and in clinic transfers are all scoot rather than squat pivots d/t LE weakness; 12/20/23: Pt reports that she plans to use her Camie lift until she can ambulate to her [facility] d/t limited space for  wc in her desired location for [facility] at home. Goal status: d/c  4. Pt will perform scoot transfer wc<>toilet  using grab bars with supv to allow toileting in community setting.  Baseline: Eval: Pt limits community outings d/t requiring use of lift for [facility] transfers; 09/28/23: Pt has performed 2 successful scoot pivot  Transfers to toilet in OT clinic with min A, extra time.  Further trials with clothing management on toilet needed, but pt can lower and hike  pants while sititng edge of mat via lateral leaning and min A to maintain core stability; 11/13/23: Pt performs with CGA and extra time; 12/19/23: Pt  performs with close supv on most attempts, occasional CGA; efficiency is improving; 01/24/24: ongoing improvements; able to complete in fewer scoots (3 scoots each way from wc<>toilet) d/t improve engagement of Les and improved anterior weight shift; occasional min guard-supv; pt completes with therapist in clinic, but not yet in other community settings with sister, and not yet voided on toilet in community. 02/28/24: has not trialed in community, completes with SBA    Goal status: ongoing  5.  Pt will complete seated clothing management with min A to enable voiding in handicapped stall within community. Baseline: Recert 11/13/23: Pt can transfer to toilet with increased time and effort, but has not yet attempted clothing management or voiding while seated on commode in community setting.  Pt can lower pants to knees while seated in wc, but requires mod A to hike over hips from seated position; 12/20/23: Pt fully lowers panties and shorts while seated on commode with supv, can hike clothing to her her upper thighs, transfers back to wc, and then performs a tricep extension from wc level to allow caregiver to hike clothing remainder of the way for purposes of EC (min A for clothing management component) Goal status: achieved  6.  Pt will tolerate standing x1 min with close supv and BUE support to manage clothing in prep for toileting. Baseline: Eval: Currently standing in electric lift only, or within parallel bars  without lift; 09/28/23: Not yet attempted; 11/13/23: Pt is able to perform static standing with heavy BUE support on parallel bars with PT; 12/19/22: Not attempted recently d/t new intermittent clonus in BLEs; 01/24/24: Tolerated standing 1 min today at sink with min A for ascent, OT assist to block R knee from buckling. 02/28/24: tolerates up to 1 min standing, difficulty achieving stand 2/2 fatigue from PT.  Goal status: in progress  7.  Pt will increase bilat grip strength in order to securely grasp walker sufficiently for functional transfers and ambulation.  Baseline: Recert 11/13/23: TBD (PT note read following OT session, indicating pt with difficulty securing items in hand);  11/15/23: Grip strength: R: 70 lbs, L: 63 lbs; 12/25/23: R 76 lbs, L 67 lbs; sufficient for functional transfers/amb  Goal status: achieved  8.  Pt will increase L [facility] skills in order to manipulate small ADL supplies with reduced dropping as indicated by 20 sec or more improvement in 9 hole peg test. Baseline: Recert 11/13/23: 9 hole peg test TBD (PT note read following OT session, indicating pt with difficulty manipulating and securing items in hands); 11/15/23: Right: 25 sec, L 49 sec; 12/25/23: Right: 24 sec; Left: 3 trials: 54 sec, 1 min, 56 sec; 01/24/24: R: 26 sec, L: 50 sec. 02/28/24: L: 40 sec.  Goal status: in progress  ASSESSMENT: CLINICAL IMPRESSION: Standing attempts at sink countertop with and without walker for support.  Pt was able  to achieve full erect standing position x2 min on last trial using RW in front of sink countertop, requiring min A to block R knee and to assist with forward WS.  Min vc for erect posture/squeezing buttocks, core, and quads.  Pt will continue to benefit from skilled OT to work towards above noted goals in OT poc, working to maximize indep with daily tasks while reducing burden of care on caregivers.   PERFORMANCE DEFICITS: in functional skills including ADLs, IADLs, strength, pain,  flexibility, Gross motor control, mobility, balance, body mechanics, endurance, and decreased knowledge of use of DME, and psychosocial skills including coping strategies, environmental adaptation, habits, and routines and behaviors.   IMPAIRMENTS: are limiting patient from ADLs, IADLs, and social participation.   CO-MORBIDITIES: has co-morbidities such as neuropathy, back pain, obesity, MS, HTN that affects occupational performance. Patient will benefit from skilled OT to address above impairments and improve overall function.  MODIFICATION OR ASSISTANCE TO COMPLETE EVALUATION: No modification of tasks or assist necessary to complete an evaluation.  OT OCCUPATIONAL PROFILE AND HISTORY: Detailed assessment: Review of records and additional review of physical, cognitive, psychosocial history related to current functional performance.  CLINICAL DECISION MAKING: Moderate - several treatment options, min-mod task modification necessary  REHAB POTENTIAL: Good  EVALUATION COMPLEXITY: Moderate  PLAN:  OT FREQUENCY: 2x/week  OT DURATION: 12 weeks  PLANNED INTERVENTIONS: 97168 OT Re-evaluation, 97535 self care/ADL training, 02889 therapeutic exercise, 97530 therapeutic activity, 97112 neuromuscular re-education, 97140 manual therapy, 97116 gait training, 02989 moist heat, 97010 cryotherapy, balance training, functional mobility training, psychosocial skills training, energy conservation, coping strategies training, patient/family education, and DME and/or AE instructions  RECOMMENDED OTHER SERVICES: None at this time (Pt currently receiving PT services in this clinic)  CONSULTED AND AGREED WITH PLAN OF CARE: Patient  PLAN FOR NEXT SESSION: see above  Inocente MARLA Blazing, OT 03/11/2024, 2:25 PM

## 2024-03-12 ENCOUNTER — Ambulatory Visit (INDEPENDENT_AMBULATORY_CARE_PROVIDER_SITE_OTHER): Payer: Self-pay | Admitting: Nurse Practitioner

## 2024-03-12 ENCOUNTER — Encounter (INDEPENDENT_AMBULATORY_CARE_PROVIDER_SITE_OTHER): Payer: Self-pay | Admitting: Nurse Practitioner

## 2024-03-12 VITALS — BP 137/73 | HR 66 | Temp 98.1°F | Ht 71.0 in | Wt 236.0 lb

## 2024-03-12 DIAGNOSIS — G35D Multiple sclerosis, unspecified: Secondary | ICD-10-CM

## 2024-03-12 DIAGNOSIS — E559 Vitamin D deficiency, unspecified: Secondary | ICD-10-CM

## 2024-03-12 DIAGNOSIS — R5383 Other fatigue: Secondary | ICD-10-CM | POA: Diagnosis not present

## 2024-03-12 DIAGNOSIS — R7303 Prediabetes: Secondary | ICD-10-CM

## 2024-03-12 DIAGNOSIS — R7989 Other specified abnormal findings of blood chemistry: Secondary | ICD-10-CM

## 2024-03-12 DIAGNOSIS — E66811 Obesity, class 1: Secondary | ICD-10-CM | POA: Diagnosis not present

## 2024-03-12 DIAGNOSIS — E7800 Pure hypercholesterolemia, unspecified: Secondary | ICD-10-CM

## 2024-03-12 DIAGNOSIS — Z6832 Body mass index (BMI) 32.0-32.9, adult: Secondary | ICD-10-CM

## 2024-03-12 DIAGNOSIS — T733XXA Exhaustion due to excessive exertion, initial encounter: Secondary | ICD-10-CM

## 2024-03-12 DIAGNOSIS — Z1331 Encounter for screening for depression: Secondary | ICD-10-CM

## 2024-03-12 DIAGNOSIS — I10 Essential (primary) hypertension: Secondary | ICD-10-CM | POA: Diagnosis not present

## 2024-03-12 DIAGNOSIS — R0602 Shortness of breath: Secondary | ICD-10-CM

## 2024-03-12 NOTE — Progress Notes (Addendum)
 [address] [place],  [place], [place] [address]  Office: [phone]  /  Fax: [phone]   Subjective   Initial Visit  Lisa Williams (MR# [id number]) is a 63 y.o. female who presents for evaluation and treatment of obesity and related comorbidities. Current BMI is Body mass index is 32.92 kg/m. Lisa Williams has been struggling with her weight for many years and has been unsuccessful in either losing weight, maintaining weight loss, or reaching her healthy weight goal.  Lisa Williams is currently in the action stage of change and ready to dedicate time achieving and maintaining a healthier weight. Lisa Williams is interested in becoming our patient and working on intensive lifestyle modifications including (but not limited to) diet and exercise for weight loss.  Lisa Williams has a history of obesity, with her highest weight being 289 pounds. She is actively trying to lose weight and aims to lose 50 to 60 pounds. She is seeking guidance on weight management to improve her mobility and overall health. She experiences fatigue, which she attributes to both her MS and reduced mobility. She typically eats two meals a day and does not snack after 8 PM. She is not on any weight-promoting medications currently, having previously tried phentermine  without success.  Lisa Williams has a history of multiple sclerosis diagnosed in 1995, which has significantly impacted her mobility. She is currently using a wheelchair due to severe limitations in mobility and a recent leg fracture.  She is currently on Mavenclad 10 mg as directed( given 5 days in June and 5 days in July and repeat same dosing next year), Baclofen  20 mg QID and Tizanidine  4 mg 1 in AM, 1 in PM and 2 at bedtime. She is followed by neurology Dr. Vear.     She has a history of hypothyroidism, recently diagnosed and dose of Levothyroxine  was recently increased from 50 mcg every day to 75 mcg daily. Her thyroid -stimulating hormone (TSH) level was  significantly elevated at 23.43 01/09/24, repeat 02/25/24 was 14.87 Her most recent A1c was 6.1, placing her in the prediabetic range. She experiences cravings for sweets, which she is working to manage. She has Vitamin D  deficiency and is currently on Cholecalciferol  5000 units daily.    She does have hypertension currently well controlled with Telmisartan  40 mg every day. Denies chest pain , shortness of breath at rest BP Readings from Last 3 Encounters:  03/12/24 137/73  02/18/24 (!) 177/78  01/09/24 128/74   She is currently on Rosuvastatin 10 mg every day  for hypercholesterolemia. Denies side effects to medication.  Lab Results  Component Value Date   CHOL 136 01/09/2024   HDL 43.20 01/09/2024   LDLCALC 78 01/09/2024   TRIG 73.0 01/09/2024   CHOLHDL 3 01/09/2024     Weight history:  When asked how their weight has affected their life and health, she states: Contributed to medical problems, Contributed to orthopedic problems or mobility issues, Having fatigue, and Having poor endurance  When asked what else they would like to accomplish? She states: Adopt a healthier eating pattern and lifestyle, Improve energy levels and physical activity, Improve existing medical conditions, Improve quality of life, Improve appearance, Improve self-confidence, and Lose 40-50 lbs  She starting to note weight gain during : adulthood in her thirties, most noted weight gain in 2018 with fractured wrist. .  Life events associated with weight gain include : medical illness she broke her tibia in 3 places 09/2022 and still doing rehab PT 2 days a week and OT.  Other  contributing factors: family history of obesity, consumption of processed foods, reduced physical activity, chronic skipping of meals, strong orexigenic signaling and/or inadequate inhibitory control , slow metabolism for age, and need for convenient foods.  Their highest weight has been:  289 lbs.  Desired weight: 180-200  Previous  weight-loss programs : Weight Watchers, Low Carb, and High Protein.  Their maximum weight loss was:  20 lbs.  Their greatest challenge with dieting: sweets cravings.  Current or previous pharmacotherapy: Phentermine .  Response to medication: Ineffective so it was discontinued   Nutritional History:  Current nutrition plan: Low-carb and Portion control / smart choices.  How many times do you eat outside the home: 1-2 per week  How often do they skip meals: skips lunch  What beverages do they drink: water, caffeinated beverages , regular soda , juice, sweet tea , and milk.   Use of artificial sweetners : Yes  Food intolerances or dislikes: beets.  Food triggers: None.  Food cravings: Sugary  Do they struggle with excessive hunger or portion control : No    Physical Activity:  Current level of physical activity: Limited due to chronic pain or orthopedic problems- currently in PT for leg fracture, chronic wheelchair use due to MS and leg fracture  Barriers to Exercise: orthopedic problems   Past medical history includes:   Past Medical History:  Diagnosis Date   Abdominal pain, right upper quadrant    Back pain    Calculus of kidney 12/09/2013   Chronic back pain    unspecified   Chronic left shoulder pain 07/19/2015   Complication of anesthesia    Functional disorder of bladder    other   Galactorrhea 11/26/2014   Chronic    Hereditary and idiopathic neuropathy 08/19/2013   History of kidney stones    HPV test positive    Hypercholesteremia 08/19/2013   Hypertension    Incomplete bladder emptying    Microscopic hematuria    MS (multiple sclerosis)    Muscle spasticity 05/21/2014   Nonspecific findings on examination of urine    other   Osteopenia    PONV (postoperative nausea and vomiting)    Status post laparoscopic supracervical hysterectomy 11/26/2014   Tobacco user 11/26/2014   Wrist fracture      Objective   BP 137/73   Pulse 66   Temp  98.1 F (36.7 C)   Ht 5' 11 (1.803 m)   Wt 236 lb (107 kg)   SpO2 100%   BMI 32.92 kg/m  She was weighed on the bioimpedance scale: Body mass index is 32.92 kg/m.    Anthropometrics:  Vitals Temp: 98.1 F (36.7 C) BP: 137/73 Pulse Rate: 66 SpO2: 100 %   Anthropometric Measurements Height: 5' 11 (1.803 m) Weight: 236 lb (107 kg) BMI (Calculated): 32.93   No data recorded Other Clinical Data Fasting: yes Labs: yes Today's Visit #: 1 Starting Date: 03/12/24    Physical Exam:  General: She is overweight, cooperative, alert, well developed, and in no acute distress. PSYCH: Has normal mood, affect and thought process.   HEENT: EOMI, sclerae are anicteric. Lungs: Normal breathing effort, no conversational dyspnea. Extremities: No edema. Limited ROM and strength of lower extremities from MS and fractures- she does use a wheelchair  Neurologic:Decreased strength of lower extremities.   Diagnostic Data Reviewed  EKG: Sinus bradycardia, rate 54. No conduction abnormalities, abnormal Q waves or chamber enlargement.  Indirect Calorimeter: she was unable to complete correctly on exam today and can  not do bioimpedence scale. VO2 showed 116 with REE of 792  Depression Screen  Lenetta's PHQ-9 score was: 0.     03/12/2024   10:21 AM  Depression screen PHQ 2/9  Decreased Interest 0  Down, Depressed, Hopeless 0  PHQ - 2 Score 0  Altered sleeping 0  Tired, decreased energy 0  Change in appetite 0  Feeling bad or failure about yourself  0  Trouble concentrating 0  Moving slowly or fidgety/restless 0  Suicidal thoughts 0  PHQ-9 Score 0  Difficult doing work/chores Not difficult at all    Screening for Sleep Related Breathing Disorders  Mariavictoria admits to daytime somnolence and denies waking up still tired. Patient has a history of symptoms of daytime fatigue. Mali generally gets 6 hours of sleep per night, and states that she has generally restful  sleep. Snoring is not present. Apneic episodes are not present. Epworth Sleepiness Score is 4.   BMET    Component Value Date/Time   NA 140 01/09/2024 1124   NA 143 03/03/2021 1128   NA 139 02/15/2013 1700   K 4.0 01/09/2024 1124   K 3.8 02/15/2013 1700   CL 105 01/09/2024 1124   CL 109 (H) 02/15/2013 1700   CO2 30 01/09/2024 1124   CO2 25 02/15/2013 1700   GLUCOSE 82 01/09/2024 1124   GLUCOSE 94 02/15/2013 1700   BUN 18 01/09/2024 1124   BUN 21 03/03/2021 1128   BUN 20 (H) 02/15/2013 1700   CREATININE 0.50 01/09/2024 1124   CREATININE 0.59 (L) 02/15/2013 1700   CALCIUM  9.1 01/09/2024 1124   CALCIUM  9.0 02/15/2013 1700   GFRNONAA >60 07/08/2023 0620   GFRNONAA >60 02/15/2013 1700   GFRAA 110 05/13/2020 1146   GFRAA >60 02/15/2013 1700   Lab Results  Component Value Date   HGBA1C 6.1 01/09/2024   HGBA1C 5.1 08/01/2011   No results found for: INSULIN  CBC    Component Value Date/Time   WBC 6.2 01/09/2024 1124   RBC 4.70 01/09/2024 1124   HGB 12.5 01/09/2024 1124   HGB 11.9 12/17/2023 1549   HCT 38.0 01/09/2024 1124   HCT 35.3 12/17/2023 1549   PLT 216.0 01/09/2024 1124   PLT 185 12/17/2023 1549   MCV 80.8 01/09/2024 1124   MCV 85 12/17/2023 1549   MCV 79 (L) 02/15/2013 1700   MCH 28.7 12/17/2023 1549   MCH 26.6 07/08/2023 0620   MCHC 33.0 01/09/2024 1124   RDW 15.3 01/09/2024 1124   RDW 15.3 12/17/2023 1549   RDW 13.6 02/15/2013 1700   Iron/TIBC/Ferritin/ %Sat    Component Value Date/Time   IRON 71 08/07/2019 0450   TIBC 253 08/07/2019 0450   FERRITIN 36 08/07/2019 0450   IRONPCTSAT 28 08/07/2019 0450   Lipid Panel     Component Value Date/Time   CHOL 136 01/09/2024 1124   CHOL 187 11/29/2016 0000   TRIG 73.0 01/09/2024 1124   HDL 43.20 01/09/2024 1124   HDL 55 11/29/2016 0000   CHOLHDL 3 01/09/2024 1124   VLDL 14.6 01/09/2024 1124   LDLCALC 78 01/09/2024 1124   LDLCALC 119 (H) 11/29/2016 0000   Hepatic Function Panel     Component Value  Date/Time   PROT 6.7 01/09/2024 1124   PROT 6.5 12/17/2023 1549   PROT 7.2 02/15/2013 1700   ALBUMIN 3.9 01/09/2024 1124   ALBUMIN 3.8 (L) 12/17/2023 1549   ALBUMIN 3.6 02/15/2013 1700   AST 12 01/09/2024 1124   AST 20 02/15/2013  1700   ALT 10 01/09/2024 1124   ALT 15 02/15/2013 1700   ALKPHOS 119 (H) 01/09/2024 1124   ALKPHOS 127 02/15/2013 1700   BILITOT 0.5 01/09/2024 1124   BILITOT 0.2 12/17/2023 1549   BILITOT 0.3 02/15/2013 1700   BILIDIR 0.1 01/09/2024 1124   BILIDIR 0.11 12/17/2023 1549      Component Value Date/Time   TSH 14.87 (H) 02/25/2024 1051     Assessment and Plan   TREATMENT PLAN FOR OBESITY:  Recommended Dietary Goals  Jannelle is currently in the action stage of change. As such, her goal is to implement medically supervised obesity management plan.  She has agreed to implement: the Category 1 plan - 1000 kcal per day- unable to do biometric scale and questionable IC of 792 calories.  Will start with lowest calorie plan and adjust if losing too much weight, expect 1-2 pounds a week  Behavioral Intervention  We discussed the following Behavioral Modification Strategies today: increasing lean protein intake to established goals, decreasing simple carbohydrates , increasing vegetables, increasing lower glycemic fruits, increasing fiber rich foods, avoiding skipping meals, increasing water intake, work on meal planning and preparation, reading food labels , keeping healthy foods at home, identifying sources and decreasing liquid calories, decreasing eating out or consumption of processed foods, and making healthy choices when eating convenient foods, planning for success, and better snacking choices  Additional resources provided today: Handout on healthy eating and balanced plate, Handout on complex carbohydrates and lean sources of protein, Category 1 packet, and Handout principles of weight management  Recommended Physical Activity Goals  Mirella has  agreed to :  Continue current level of physical activity , Think about enjoyable ways to increase daily physical activity and overcoming barriers to exercise, and Increase physical activity in their day and reduce sedentary time (increase NEAT). She has MS and had fractured Tibia last year and has persistent weakness- has PT 2/week. Most times she is in a wheelchair.   Medical Interventions and Pharmacotherapy We will work on building a therapist, art and behavioral strategies. We will discuss the role of pharmacotherapy as an adjunct at subsequent visits.   ASSOCIATED CONDITIONS ADDRESSED TODAY  Other Fatigue  Joelys does feel that her weight is causing her energy to be lower than it should be. Fatigue may be related to obesity, depression or many other causes. Labs will be ordered, and in the meanwhile, Alba will focus on self care including making healthy food choices, increasing physical activity and focusing on stress reduction.  Shortness of Breath Coby notes increasing shortness of breath with physical activity and seems to be worsening over time with weight gain. She notes getting out of breath sooner with activity than she used to. This has not gotten worse recently. Temiloluwa denies shortness of breath at rest or orthopnea.  Depression screening       Negative screen today.  Hypercholesteremia Focus on implementing category 1 meal plan, limit saturated fats Loss of 10-15% body weight can improve lipid levels Continue Rosuvastatin 10 mg every day and follow with PCP -     Comprehensive metabolic panel with GFR  Essential hypertension Continue telmisartan  40 mg every day Start Category 1 meal plan  and DASH diet Monitor BP and if consistently >140/90 notify PCP If develops headaches, chest pain, shortness of breath or dizziness go to ER Loss of 10-15% body weight can help improve blood pressures  -     Comprehensive metabolic panel with  GFR  Abnormal LFTs- Normal RUQ U/S 09/28/2023 Focus on Category 1 meal plan and limit simple carbs Loss of 10-15% of body weight can improve liver functions -     Comprehensive metabolic panel with GFR  Multiple sclerosis       Continue Muscle relaxants and Navenclad as directed        Monitor symptoms and continue to follow with Dr. Vear  Prediabetes Start Category 1  meal plan, limit simple carbohydrates Decreasing body weight by 10-15% can improve glucose levels -     Comprehensive metabolic panel with GFR -     Insulin , random  Vitamin D  deficiency If Vit D level remains low will supplement with Ergocalciferol  50000 units once a week for 12 weeks and then recheck level.   -     VITAMIN D  25 Hydroxy (Vit-D Deficiency, Fractures)  Class 1 obesity with serious comorbidity and body mass index (BMI) of 32.0 to 32.9 in adult, unspecified obesity type See plan above -     Comprehensive metabolic panel with GFR -     Insulin , random -     Vitamin B12 -     VITAMIN D  25 Hydroxy (Vit-D Deficiency, Fractures) -     Magnesium     Follow-up  She was informed of the importance of frequent follow-up visits to maximize her success with intensive lifestyle modifications for her multiple health conditions. She was informed we would discuss her lab results at her next visit unless there is a critical issue that needs to be addressed sooner. Simisola agreed to keep her next visit at the agreed upon time to discuss these results.  Attestation Statement  This is the patient's intake visit at [facility]. The patient's Health Questionnaire was reviewed at length. Included in the packet: current and past health history, medications, allergies, ROS, gynecologic history (women only), surgical history, family history, social history, weight history, weight loss surgery history (for those that have had weight loss surgery), nutritional evaluation, mood and food questionnaire, PHQ9,  Epworth questionnaire, sleep habits questionnaire, patient life and health improvement goals questionnaire. These will all be scanned into the patient's chart under media.   During the visit, I independently reviewed the patient's EKG, previous labs, bioimpedance scale results, and indirect calorimetry results. I used this information to medically tailor a meal plan for the patient that will help her to lose weight and will improve her obesity-related conditions. I performed a medically necessary appropriate examination and/or evaluation. I discussed the assessment and treatment plan with the patient. The patient was provided an opportunity to ask questions and all were answered. The patient agreed with the plan and demonstrated an understanding of the instructions. Labs were ordered at this visit and will be reviewed at the next visit unless critical results need to be addressed immediately. Clinical information was updated and documented in the EMR.   In addition, they received basic education on identification of processed foods and reduction of these, different sources of lean proteins and complex carbohydrates and how to eat balanced by incorporation of whole foods.  Reviewed by clinician on day of visit: allergies, medications, problem list, medical history, surgical history, family history, social history, and previous encounter notes.  I personally spent a total of 50 minutes in the care of the patient today including preparing to see the patient, getting/reviewing separately obtained history, performing a medically appropriate exam/evaluation, counseling and educating, placing orders, documenting clinical information in the EHR, and coordinating care. Excludes time for depression  screening(5 minutes), indirect calorimetry and EKG.    Aly Seidenberg ANP-C

## 2024-03-12 NOTE — Progress Notes (Deleted)
   SUBJECTIVE:  Chief Complaint: Obesity  Interim History: ***  Florice is here to discuss her progress with her obesity treatment plan. She is on the keeping a food journal and adhering to recommended goals of 1100-1300 calories and 85 grams of protein and states she is following her eating plan approximately *** % of the time. She states she {CHL AMB IS/IS NOT:210130109} exercising *** minutes *** times per week.   OBJECTIVE: Visit Diagnoses: Problem List Items Addressed This Visit       Other   Depression screening   Fatigue due to excessive exertion - Primary   Relevant Orders   EKG 12-Lead   SOB (shortness of breath) on exertion    No data recorded Anthropometric Measurements Height: 5' 11 (1.803 m)   No data recorded Other Clinical Data Fasting: yes Labs: yes Today's Visit #: 1 Starting Date: 03/12/24     ASSESSMENT AND PLAN:  Diet: Kimberle is currently in the action stage of change. As such, her goal is to continue with weight loss efforts and has agreed to {MWMwtlossportion/plan2:23431}.   Exercise:  {MWM Exercise Recommendations:210964029}  Behavior Modification:  We discussed the following Behavioral Modification Strategies today: {HWW Behavior Modification:210964008}. We discussed various medication options to help Sarafina with her weight loss efforts and we both agreed to ***.  No follow-ups on file.   She was informed of the importance of frequent follow up visits to maximize her success with intensive lifestyle modifications for her multiple health conditions.  Attestation Statements:   Reviewed by clinician on day of visit: allergies, medications, problem list, medical history, surgical history, family history, social history, and previous encounter notes.   Time spent on visit including pre-visit chart review and post-visit care and charting was *** minutes  Adelita Cho, MD

## 2024-03-13 ENCOUNTER — Ambulatory Visit: Payer: PPO

## 2024-03-13 ENCOUNTER — Ambulatory Visit

## 2024-03-13 ENCOUNTER — Ambulatory Visit (INDEPENDENT_AMBULATORY_CARE_PROVIDER_SITE_OTHER): Payer: Self-pay | Admitting: Nurse Practitioner

## 2024-03-13 DIAGNOSIS — R2681 Unsteadiness on feet: Secondary | ICD-10-CM

## 2024-03-13 DIAGNOSIS — M6281 Muscle weakness (generalized): Secondary | ICD-10-CM | POA: Diagnosis not present

## 2024-03-13 DIAGNOSIS — R262 Difficulty in walking, not elsewhere classified: Secondary | ICD-10-CM

## 2024-03-13 DIAGNOSIS — G35D Multiple sclerosis, unspecified: Secondary | ICD-10-CM

## 2024-03-13 DIAGNOSIS — R2689 Other abnormalities of gait and mobility: Secondary | ICD-10-CM

## 2024-03-13 DIAGNOSIS — R278 Other lack of coordination: Secondary | ICD-10-CM

## 2024-03-13 DIAGNOSIS — R269 Unspecified abnormalities of gait and mobility: Secondary | ICD-10-CM

## 2024-03-13 LAB — MAGNESIUM: Magnesium: 2.1 mg/dL (ref 1.6–2.3)

## 2024-03-13 LAB — COMPREHENSIVE METABOLIC PANEL WITH GFR
ALT: 12 IU/L (ref 0–32)
AST: 14 IU/L (ref 0–40)
Albumin: 4.4 g/dL (ref 3.9–4.9)
Alkaline Phosphatase: 158 IU/L — ABNORMAL HIGH (ref 49–135)
BUN/Creatinine Ratio: 33 — ABNORMAL HIGH (ref 12–28)
BUN: 22 mg/dL (ref 8–27)
Bilirubin Total: 0.5 mg/dL (ref 0.0–1.2)
CO2: 23 mmol/L (ref 20–29)
Calcium: 9.6 mg/dL (ref 8.7–10.3)
Chloride: 106 mmol/L (ref 96–106)
Creatinine, Ser: 0.66 mg/dL (ref 0.57–1.00)
Globulin, Total: 2.8 g/dL (ref 1.5–4.5)
Glucose: 80 mg/dL (ref 70–99)
Potassium: 4.5 mmol/L (ref 3.5–5.2)
Sodium: 142 mmol/L (ref 134–144)
Total Protein: 7.2 g/dL (ref 6.0–8.5)
eGFR: 99 mL/min/1.73 (ref >59)

## 2024-03-13 LAB — VITAMIN D 25 HYDROXY (VIT D DEFICIENCY, FRACTURES): Vit D, 25-Hydroxy: 76.9 ng/mL (ref 30.0–100.0)

## 2024-03-13 LAB — INSULIN, RANDOM: INSULIN: 8 u[IU]/mL (ref 2.6–24.9)

## 2024-03-13 LAB — VITAMIN B12: Vitamin B-12: 461 pg/mL (ref 232–1245)

## 2024-03-13 NOTE — Therapy (Signed)
 OUTPATIENT PHYSICAL THERAPY NEURO TREATMENT   Patient Name: Lisa Williams MRN: [id number] DOB:Jan 26, 1961, 63 y.o., female Today's Date: 03/13/2024  PCP: Lenon Na  REFERRING PROVIDER: Lenon Na   END OF SESSION:  PT End of Session - 03/13/24 1027     Visit Number 68    Number of Visits 70    Date for Recertification  03/20/24   corrected   Progress Note Due on Visit 50    PT Start Time 1018    PT Stop Time 1100    PT Time Calculation (min) 42 min    Equipment Utilized During Treatment Gait belt    Activity Tolerance Patient tolerated treatment well;No increased pain    Behavior During Therapy [facility] for tasks assessed/performed          Past Medical History:  Diagnosis Date   Abdominal pain, right upper quadrant    Back pain    Calculus of kidney 12/09/2013   Chronic back pain    unspecified   Chronic left shoulder pain 07/19/2015   Complication of anesthesia    Functional disorder of bladder    other   Galactorrhea 11/26/2014   Chronic    Hereditary and idiopathic neuropathy 08/19/2013   High cholesterol    History of kidney stones    HPV test positive    Hypercholesteremia 08/19/2013   Hypertension    Hypertension    Hypothyroidism    Incomplete bladder emptying    Microscopic hematuria    MS (multiple sclerosis)    Muscle spasticity 05/21/2014   Nonspecific findings on examination of urine    other   Osteopenia    PONV (postoperative nausea and vomiting)    Prediabetes    Sickle cell trait    Status post laparoscopic supracervical hysterectomy 11/26/2014   Tobacco user 11/26/2014   Wrist fracture    Past Surgical History:  Procedure Laterality Date   bilateral tubal ligation  1996   BREAST CYST EXCISION Left 2002   CYST EXCISION Left 05/10/2022   Procedure: CYST REMOVAL;  Surgeon: Rodolph Romano, MD;  Location: [facility] ORS;  Service: General;  Laterality: Left;   FRACTURE SURGERY     KNEE SURGERY     right    LAPAROSCOPIC SUPRACERVICAL HYSTERECTOMY  08/05/2013   ORIF WRIST FRACTURE Left 01/17/2017   Procedure: OPEN REDUCTION INTERNAL FIXATION (ORIF) WRIST FRACTURE;  Surgeon: Leora Lynwood SAUNDERS, MD;  Location: [facility] OR[facility];  Service: Orthopedics;  Laterality: Left;   RADIOLOGY WITH ANESTHESIA N/A 03/18/2020   Procedure: MRI WITH ANESTHESIA CERVICAL SPINE AND BRAIN  WITH AND WITHOUT CONTRAST;  Surgeon: Radiologist, Medication, MD;  Location: [facility] OR;  Service: Radiology;  Laterality: N/A;   right tibia fracture Right    2024   TUBAL LIGATION Bilateral    VAGINAL HYSTERECTOMY  03/2006   VAGINAL HYSTERECTOMY     2014   Patient Active Problem List   Diagnosis Date Noted   SOB (shortness of breath) on exertion 03/12/2024   Hyperglycemia 01/09/2024   Abnormal LFTs (liver function tests) 09/17/2023   Sinus tachycardia 07/07/2023   Aortic atherosclerosis 01/18/2023   Prediabetes 01/18/2023   Hypokalemia 07/29/2022   Weakness of both lower extremities 07/29/2022   Abscess of left groin 11/15/2021   Abnormal LFTs 11/15/2021   Acute respiratory disease due to COVID-19 virus 11/21/2020   Fatigue due to excessive exertion    Hypoalbuminemia due to protein-calorie malnutrition    Neurogenic bowel    Neurogenic bladder    Labile  blood pressure    Neuropathic pain    Multiple sclerosis, relapsing-remitting 07/09/2019   Abscess of female pelvis    SVT (supraventricular tachycardia)    Radial styloid tenosynovitis 03/12/2018   Wheelchair dependence 02/27/2018   Localized osteoporosis with current pathological fracture with routine healing 01/19/2017   Wrist fracture 01/16/2017   Sprain of ankle 03/23/2016   Closed fracture of lateral malleolus 03/16/2016   Depression screening 01/24/2016   Blood pressure elevated without history of HTN 10/25/2015   Essential hypertension 10/25/2015   Multiple sclerosis 10/02/2015   Chronic left shoulder pain 07/19/2015   Multiple sclerosis exacerbation 07/14/2015    MS (multiple sclerosis) 11/26/2014   Increased body mass index 11/26/2014   HPV test positive 11/26/2014   Status post laparoscopic supracervical hysterectomy 11/26/2014   Galactorrhea 11/26/2014   Back ache 05/21/2014   Adiposity 05/21/2014   Disordered sleep 05/21/2014   Muscle spasticity 05/21/2014   Spasticity 05/21/2014   Calculus of kidney 12/09/2013   Renal colic 12/09/2013   Hypercholesteremia 08/19/2013   Hereditary and idiopathic neuropathy 08/19/2013   Hypercholesterolemia without hypertriglyceridemia 08/19/2013   Bladder infection, chronic 07/25/2012   Disorder of bladder function 07/25/2012   Incomplete bladder emptying 07/25/2012   Microscopic hematuria 07/25/2012   Right upper quadrant pain 07/25/2012    ONSET DATE: 1995  REFERRING DIAG: MS  THERAPY DIAG:  Muscle weakness (generalized)  Other lack of coordination  Multiple sclerosis exacerbation  Difficulty in walking, not elsewhere classified  Unsteadiness on feet  Abnormality of gait and mobility  Other abnormalities of gait and mobility  Rationale for Evaluation and Treatment: Rehabilitation  SUBJECTIVE:                                                                                                                                                                                             SUBJECTIVE STATEMENT:  Pt reports feeling good today and is ready to walk.  No other complaints at this time.  Pt accompanied by: self  PERTINENT HISTORY:  Patient is returning to PT s/p hospitalization.  s/p ORIF of R tibia shaft fracture 09/23/2022. Patient has weakness in BLE with RLE>LLE. She drives with hand controls. Patient has been diagnosed with MS in 1995. PMH includes: back pain, CBP, chronic L shoulder pain, galactorrhea, neuropathy, HPV, hypercholestermeia, HTN, MS, osteopenia, PONV, wrist fracture. Additional order for other closed fracture of proximal end of R tibia with routine healing. Still has to  use [employer].   PAIN:  Are you having pain? Occasional pain in RLE; primarily in knee  PRECAUTIONS: Fall  RED FLAGS: None   WEIGHT BEARING  RESTRICTIONS: No  FALLS: Has patient fallen in last 6 months? No  LIVING ENVIRONMENT: Lives with: lives with their family Lives in: House/apartment Stairs: ramp Has following equipment at home: Vannie - 2 wheeled, Wheelchair (manual), shower chair, and Grab bars  PLOF: Independent with household mobility with device  PATIENT GOALS: to get her independence back. To be able to get into car, toilet, and get dressed independently  OBJECTIVE:  Note: Objective measures were completed at Evaluation unless otherwise noted.  DIAGNOSTIC FINDINGS: MRI of the brain 03/18/2020 showed multiple T2/FLAIR hyperintense foci in the periventricular, juxtacortical and deep white matter.  There were no infratentorial lesions noted.  None of the foci enhanced.   Due to severe claustrophobia, the study was done with conscious sedation in the hospital   COGNITION: Overall cognitive status: Within functional limits for tasks assessed   SENSATION: Lack of sensation in feet, loss in bilateral lateral aspect of knee  COORDINATION: Does not have the strength for functional LE heel slide test   MUSCLE TONE: BLE mild tone    POSTURE: rounded shoulders, forward head, anterior pelvic tilt, and weight shift left   LOWER EXTREMITY MMT:    MMT Right Eval Left Eval  Hip flexion 0.9 1.5  Hip extension    Hip abduction 1.8 2.6  Hip adduction 3.1 1.9  Hip internal rotation    Hip external rotation    Knee flexion 0.9 1.5  Knee extension 0.8 1.2  Ankle dorsiflexion    Ankle plantarflexion    Ankle inversion    Ankle eversion    (Blank rows = not tested)  BED MOBILITY:  Assess in future session due to limited time  TRANSFERS: Assistive device utilized: Bariatric RW with sit to stands, slide board to table  Sit to stand: unable to reach full stand Stand  to sit: unable to reach full stand  Chair to chair: slide board with CGA   GAIT: Unable to ambulate at this time.   FUNCTIONAL TESTS:  Sit to stand: tricep press with BUE; unable to bring arm to walker.    Function In Sitting Test (FIST)  (1/2 femur on surface; hips/knees flexed to 90deg)   - indicate bed or mat table / step stool if used  SCORING KEY: 4 = Independent (completes task independently & successfully) 3 = Verbal Cues/Increased Time (completes task independently & successfully and only needs more time/cues) 2 = Upper Extremity Support (must use UE for support or assistance to complete successfully) 1 = Needs Assistance (unable to complete w/o physical assist; DOCUMENT LEVEL: min, mod, max) 0 = Dependent (requires complete physical assist; unable to complete successfully even w/ physical assist)  Randomly Administer Once Throughout Exam  4 - Anterior Nudge (superior sternum)  4 - Posterior Nudge (between scapular spines)  4 - Lateral Nudge (to dominant side at acromion)     4 - Static sitting (30 seconds)  4 - Sitting, shake 'no' (left and right)  4 - Sitting, eyes closed (30 seconds)   0 - Sitting, lift foot (dominant side, lift foot 1 inch twice)    2 - Pick up object from behind (object at midline, hands breadth posterior)  3 - Forward reach (use dominant arm, must complete full motion) 2 - Lateral reach (use dominant arm, clear opposite ischial tuberosity) 2 - Pick up object from floor (from between feet)   2 - Posterior scooting (move backwards 2 inches)  2 - Anterior scooting (move forward 2 inches)  2 -  Lateral scooting (move to dominant side 2 inches)    TOTAL = 39/56  Notes/comments: slide board tranfer to/from table   MCD > 5 points MCID for IP REHAB > 6 points  Function In Sitting Test (FIST) 08/14/23 (1/2 femur on surface; hips/knees flexed to 90deg)   - indicate bed or mat table / step stool if used  SCORING KEY: 4 = Independent (completes  task independently & successfully) 3 = Verbal Cues/Increased Time (completes task independently & successfully and only needs more time/cues) 2 = Upper Extremity Support (must use UE for support or assistance to complete successfully) 1 = Needs Assistance (unable to complete w/o physical assist; DOCUMENT LEVEL: min, mod, max) 0 = Dependent (requires complete physical assist; unable to complete successfully even w/ physical assist)  Randomly Administer Once Throughout Exam  4 - Anterior Nudge (superior sternum)  4 - Posterior Nudge (between scapular spines)  4 - Lateral Nudge (to dominant side at acromion)     4 - Static sitting (30 seconds)  4 - Sitting, shake 'no' (left and right)  4 - Sitting, eyes closed (30 seconds)   0 - Sitting, lift foot (dominant side, lift foot 1 inch twice)    4 - Pick up object from behind (object at midline, hands breadth posterior)  4 - Forward reach (use dominant arm, must complete full motion) 4 - Lateral reach (use dominant arm, clear opposite ischial tuberosity)  - Pick up object from floor (from between feet)   3 - Posterior scooting (move backwards 2 inches)  3 - Anterior scooting (move forward 2 inches)  3 - Lateral scooting (move to dominant side 2 inches)    TOTAL = 47/56  MCD > 5 points MCID for IP REHAB > 6 points   TREATMENT DATE: 03/13/24   TherAct:    STS x5    Gait:   Pt ambulated for a total of 3 attempts, 15' on first attempt, 32' on the second attempt, 3' on the third attempt (33' in total).  Pt utilized the bariatric FWW, pillow under the thighs for easier ability to come upright, and sock applied to the L foot to reduce friction of the foot in mobility.   Pt was unable to continue with ambulation due to the fatigue and tone in the R LE.  Pt was having increased clonus as well throughout the session which limited any of the further attempts.    Rest breaks given and pt's symptoms monitored throughout the  session.     PATIENT EDUCATION: Education details: Pt educated throughout session about proper posture and technique with exercises. Improved exercise technique, movement at target joints, use of target muscles after min to mod verbal, visual, tactile cues.  Person educated: Patient Education method: Explanation, Demonstration, Tactile cues, and Verbal cues Education comprehension: verbalized understanding, returned demonstration, verbal cues required, tactile cues required, and needs further education  HOME EXERCISE PROGRAM: Stand 3x/day in stander   GOALS: Goals reviewed with patient? Yes  SHORT TERM GOALS: Target date: 08/08/2023  Patient will be independent in home exercise program to improve strength/mobility for better functional independence with ADLs.  Baseline: 4/8: compliance Goal status: MET    LONG TERM GOALS: Target date: 03/20/2024  Patient will tolerate five consecutive stands with UE support from wheelchair to standing to improve functional mobility with additional cushion and RW.  Baseline: unable to perform 4/8:unable to perform full stand 5/20: perform next session 5/29: two full stands with CGA and cushion on her  seat 7/31: able to stand 3 times from [facility]. With no addition cushion  in parallel bars. 12/27/2023= Will retest next session- concentrated on just standing on past 2 visits Goal status: Ongoing   2.  Patient will ambulate 10 ft with bRW with wheelchair follow.  Baseline:  unable to ambulate 4/8: unable to ambulate 5/20: able to ambulate length of // bars in previous session 5/27: ambulate 2.5 lengths of // bars with two seated rest breaks with min A for weight shift and wheelchair follow  7/31: able to ambulate in parallel bars 3 ft with min assist and +2 follow in WC> difficulty advancing the LLE on this day. 12/27/2023- Patient unable to take a step forward today but was abel to take a step on previous visit- limited by clonus like trembling that has  progressed over past month. Goal status: Ongoing   3.  Patient will improve FIST score >6 points to demonstrate improved stability and ability to perform ADLs.  Baseline:  3/6: 39/56 4/8: 47/ 56 5/20: 48/56 7/31:46/56 Goal status: MET  4.  Patient will perform toileting with mod I at home for improved independence.  Baseline: 3/6: requires assistance 4/8: requires dependence on machine 5/20: requires assistance with use of wipe buddy.  7/31: continues to require use of sara steady and wipe buddy for safety and hygiene. 12/27/2023- Patient reports that since she is unable to take steps that she continues to require use of sara steady and wipe buddy for safe personal hygiene. Goal status: ongoing   5. Patient will perform > 5 min of static or dynamic standing with BUE and Min/mod assist  for improved LE strength and pre-gait function to assist with toileting and overall standing ability for improve functional independence.  Baseline: 12/27/2023-Patient was able to stand for just over 3 min today. 01/10/24: Pt able to stand 1 min 38 sec 02/14/24: Pt able to >3 minutes at this time. Goal status: PROGRESSING    ASSESSMENT:  CLINICAL IMPRESSION:  Pt responded well to the ambulation attempts and was able to perform the attempts.  Pt was able to perform the STS's well, but when attempting to perform steps on the 3rd-5th attempts, pt was noting increased tone and spasticity along with clonus in the R LE.  Pt unable to continue with the ambulation attempts although attempted on multiple attempts.   Pt will continue to benefit from skilled therapy to address remaining deficits in order to improve overall QoL and return to PLOF.       OBJECTIVE IMPAIRMENTS: Abnormal gait, cardiopulmonary status limiting activity, decreased activity tolerance, decreased balance, decreased coordination, decreased endurance, decreased mobility, difficulty walking, decreased ROM, decreased strength, hypomobility, increased  fascial restrictions, impaired perceived functional ability, impaired flexibility, impaired sensation, improper body mechanics, and postural dysfunction.   ACTIVITY LIMITATIONS: carrying, lifting, bending, sitting, standing, squatting, sleeping, stairs, transfers, bed mobility, continence, bathing, toileting, dressing, self feeding, reach over head, hygiene/grooming, locomotion level, and caring for others  PARTICIPATION LIMITATIONS: meal prep, cleaning, laundry, interpersonal relationship, driving, shopping, community activity, and church  PERSONAL FACTORS: Age, Past/current experiences, Time since onset of injury/illness/exacerbation, Transportation, and 3+ comorbidities: ack pain, CBP, chronic L shoulder pain, galactorrhea, neuropathy, HPV, hypercholestermeia, HTN, MS, osteopenia, PONV, wrist fracture are also affecting patient's functional outcome.   REHAB POTENTIAL: Good  CLINICAL DECISION MAKING: Evolving/moderate complexity  EVALUATION COMPLEXITY: Moderate  PLAN:  PT FREQUENCY: 2x/week  PT DURATION: 12 weeks  PLANNED INTERVENTIONS: [id number]- PT Re-evaluation, 97110-Therapeutic exercises, 97530- Therapeutic activity, [id number]- Neuromuscular  re-education, [phone]- Self Care, [id number]- Manual therapy, [phone]- Gait training, [phone]- Orthotic Fit/training, [phone]- Canalith repositioning, [id number]- Aquatic Therapy, [phone]- Electrical stimulation (unattended), [phone]- Electrical stimulation (manual), [id number]- Ultrasound, [id number]- Traction (mechanical), Patient/Family education, Balance training, Stair training, Taping, Dry Needling, Joint mobilization, Joint manipulation, Spinal manipulation, Spinal mobilization, Scar mobilization, Compression bandaging, Vestibular training, Visual/preceptual remediation/compensation, Cognitive remediation, DME instructions, Cryotherapy, Moist heat, and Biofeedback  PLAN FOR NEXT SESSION:  Strap stand frame with more dynamic activities Sit to stands; try supported walking,  continue dynamic core stabilization training and seated reaching tasks to improve weight shifting.    Fonda Simpers, PT, DPT Physical Therapist - [facility]  03/13/24, 10:32 AM

## 2024-03-15 NOTE — Therapy (Signed)
 OUTPATIENT OCCUPATIONAL THERAPY NEURO TREATMENT NOTE  Patient Name: Lisa Williams MRN: [id number] DOB:24-Mar-1961, 63 y.o., female Today's Date: 03/15/2024  PCP: Dr. Layman Williams   Neurologist Dr. Suanne at [facility] REFERRING PROVIDER: Dr. Layman Williams    END OF SESSION:  OT End of Session - 03/15/24 1125     Visit Number 54    Number of Visits 67    Date for Recertification  04/29/24    Authorization Time Period Reporting period beginning 02/28/24    Progress Note Due on Visit 60    OT Start Time 1100    OT Stop Time 1145    OT Time Calculation (min) 45 min    Equipment Utilized During Treatment manual wc    Activity Tolerance Patient tolerated treatment well    Behavior During Therapy [facility] for tasks assessed/performed         Past Medical History:  Diagnosis Date   Abdominal pain, right upper quadrant    Back pain    Calculus of kidney 12/09/2013   Chronic back pain    unspecified   Chronic left shoulder pain 07/19/2015   Complication of anesthesia    Functional disorder of bladder    other   Galactorrhea 11/26/2014   Chronic    Hereditary and idiopathic neuropathy 08/19/2013   High cholesterol    History of kidney stones    HPV test positive    Hypercholesteremia 08/19/2013   Hypertension    Hypertension    Hypothyroidism    Incomplete bladder emptying    Microscopic hematuria    MS (multiple sclerosis)    Muscle spasticity 05/21/2014   Nonspecific findings on examination of urine    other   Osteopenia    PONV (postoperative nausea and vomiting)    Prediabetes    Sickle cell trait    Status post laparoscopic supracervical hysterectomy 11/26/2014   Tobacco user 11/26/2014   Wrist fracture    Past Surgical History:  Procedure Laterality Date   bilateral tubal ligation  1996   BREAST CYST EXCISION Left 2002   CYST EXCISION Left 05/10/2022   Procedure: CYST REMOVAL;  Surgeon: Lisa Romano, MD;  Location: [facility] ORS;  Service:  General;  Laterality: Left;   FRACTURE SURGERY     KNEE SURGERY     right   LAPAROSCOPIC SUPRACERVICAL HYSTERECTOMY  08/05/2013   ORIF WRIST FRACTURE Left 01/17/2017   Procedure: OPEN REDUCTION INTERNAL FIXATION (ORIF) WRIST FRACTURE;  Surgeon: Lisa Lynwood SAUNDERS, MD;  Location: [facility] ORS;  Service: Orthopedics;  Laterality: Left;   RADIOLOGY WITH ANESTHESIA N/A 03/18/2020   Procedure: MRI WITH ANESTHESIA CERVICAL SPINE AND BRAIN  WITH AND WITHOUT CONTRAST;  Surgeon: Radiologist, Medication, MD;  Location: [facility] OR;  Service: Radiology;  Laterality: N/A;   right tibia fracture Right    2024   TUBAL LIGATION Bilateral    VAGINAL HYSTERECTOMY  03/2006   VAGINAL HYSTERECTOMY     2014   Patient Active Problem List   Diagnosis Date Noted   SOB (shortness of breath) on exertion 03/12/2024   Hyperglycemia 01/09/2024   Abnormal LFTs (liver function tests) 09/17/2023   Sinus tachycardia 07/07/2023   Aortic atherosclerosis 01/18/2023   Prediabetes 01/18/2023   Hypokalemia 07/29/2022   Weakness of both lower extremities 07/29/2022   Abscess of left groin 11/15/2021   Abnormal LFTs 11/15/2021   Acute respiratory disease due to COVID-19 virus 11/21/2020   Fatigue due to excessive exertion    Hypoalbuminemia due to protein-calorie malnutrition  Neurogenic bowel    Neurogenic bladder    Labile blood pressure    Neuropathic pain    Multiple sclerosis, relapsing-remitting 07/09/2019   Abscess of female pelvis    SVT (supraventricular tachycardia)    Radial styloid tenosynovitis 03/12/2018   Wheelchair dependence 02/27/2018   Localized osteoporosis with current pathological fracture with routine healing 01/19/2017   Wrist fracture 01/16/2017   Sprain of ankle 03/23/2016   Closed fracture of lateral malleolus 03/16/2016   Depression screening 01/24/2016   Blood pressure elevated without history of HTN 10/25/2015   Essential hypertension 10/25/2015   Multiple sclerosis 10/02/2015   Chronic  left shoulder pain 07/19/2015   Multiple sclerosis exacerbation 07/14/2015   MS (multiple sclerosis) 11/26/2014   Increased body mass index 11/26/2014   HPV test positive 11/26/2014   Status post laparoscopic supracervical hysterectomy 11/26/2014   Galactorrhea 11/26/2014   Back ache 05/21/2014   Adiposity 05/21/2014   Disordered sleep 05/21/2014   Muscle spasticity 05/21/2014   Spasticity 05/21/2014   Calculus of kidney 12/09/2013   Renal colic 12/09/2013   Hypercholesteremia 08/19/2013   Hereditary and idiopathic neuropathy 08/19/2013   Hypercholesterolemia without hypertriglyceridemia 08/19/2013   Bladder infection, chronic 07/25/2012   Disorder of bladder function 07/25/2012   Incomplete bladder emptying 07/25/2012   Microscopic hematuria 07/25/2012   Right upper quadrant pain 07/25/2012   ONSET DATE: 5/24 Amon and broke R leg which caused beginning of ADL decline; diagnosed with MS in 1995)  REFERRING DIAG: MS  THERAPY DIAG:  Muscle weakness (generalized)  Other lack of coordination  Multiple sclerosis exacerbation  Rationale for Evaluation and Treatment: Rehabilitation  SUBJECTIVE:  SUBJECTIVE STATEMENT: Pt reports that she was able to get her new standing frame sling today so that she can begin using her frame at home.   Pt accompanied by: self  PERTINENT HISTORY: Pt reports increased difficulty with basic self care tasks since breaking her R leg last May.  Since that time, mobility and ADLs have declined, with pt requiring assist from private caregivers and family to manage bathing, toileting, dressing, and functional transfers.  PRECAUTIONS: Fall  WEIGHT BEARING RESTRICTIONS: No  PAIN: 03/13/24: No pain today  Are you having pain? No; occasional pain in R lower back, but not today  FALLS: Has patient fallen in last 6 months? No Last fall was May 17 of 2024  LIVING ENVIRONMENT: Lives with: lives with their family (including mother who has dementia and  sister Lisa Williams)  Lives in: 2 level home but pt resides on main level Stairs: ramp  Has following equipment at home: Wheelchair (manual), Shower bench, and hand held shower shower, 1 grab bar in the shower, 3in1 commode, sit to stand lift (electric), FWW, hospital bed  PLOF: modified indep with ADL/IADLs prior to May of 7975   PATIENT GOALS: Increase independence with basic self care tasks  OBJECTIVE:  Note: Objective measures were completed at Evaluation unless otherwise noted.  HAND DOMINANCE: Right  ADLs:  Overall ADLs: assist provided from paid caregiver and sister Transfers/ambulation related to ADLs: sit to stand lift for all transfers, set up for sliding board in/out of bed  Eating: indep  Grooming: modified indep (wc level in mom's bathroom)  UB Dressing: set up (can't access her bedroom closet)  LB Dressing: able to sit on side of bed to don all LB clothing; assist to hike pants/underwear Toileting: assist with clothing management d/t standing within electric lift Bathing: bed bath with sister helping to wash backside Tub  Shower transfers: N/A; pt has walk in shower in mom's bathroom (wc does fit in this bathroom but pt reports inability to transfer from wc and requires arm rests on a bench for a successful transfer attempt from any DME).  Tub bench is in pt's bathroom, but lift does not fit through bathroom doorway and pt reports inability to transfer up from tub bench (currently unable to manage either transfer)   Equipment: see above  IADLs: Shopping: [employer] for shopping Light housekeeping: modified indep from wc level Meal Prep: modified indep from wc level Community mobility: relies on community transportation or family members Medication management: indep Landscape architect: indep Handwriting: NT; pt denies any [facility] challenges  MOBILITY STATUS: Hx of falls  POSTURE COMMENTS:  Rounded shoulders, forward head, anterior pelvic tilt, and weight shift left    ACTIVITY TOLERANCE: Eval: Activity tolerance: Per PT; currently standing 30-60 sec within Light Gait harness/lift; currently non-ambulatory  01/24/24: Standing at sink: 1 minute; min A and 5 attempts to achieve fully erect standing position with BUEs supported on sink countertop, OT assist to block R knee to prevent buckling   02/28/24: x4 attempts, achieves 75% upright standing with MIN A to block knees.   UPPER EXTREMITY ROM:  BUEs WFL  UPPER EXTREMITY MMT:     MMT Right eval Left eval  Shoulder flexion 4+ 4  Shoulder abduction 4+ 4  Shoulder adduction    Shoulder extension    Shoulder internal rotation 4+ 4+  Shoulder external rotation 4 4  Middle trapezius    Lower trapezius    Elbow flexion 4+ 4+  Elbow extension 4+ 4+  Wrist flexion 4+ 4+  Wrist extension 4+ 4+  Wrist ulnar deviation    Wrist radial deviation    Wrist pronation    Wrist supination    (Blank rows = not tested)  HAND FUNCTION/COORDINATION:  R/L WNL; pt denies any coordination deficits/ able to manipulate pills/clothing fasteners/etc without difficulty  11/15/23: *Measures taken d/t PT note revealing pt with increased difficulty manipulating smaller ADL supplies during reaching activities in PT session   11/15/23: Grip strength: R: 70 lbs, L: 63 lbs Pinch strength: Lateral: R: 16 lbs, L: 15 lbs; 3 point pinch: R: 12 lbs, L: 13 lbs 9 hole peg test: Right: 25 sec, L 49 sec   12/25/23: Grip strength: Right: 70 lbs; Left: 65 lbs  9 hole peg test: Right: 24 sec; Left: 3 trials: 54 sec, 1 min, 56 sec  01/24/24: Grip grip: Right: 76 lbs; Left: 67 lbs Lateral pinch: R: 17 lbs, L: 17 lbs; 3 point pinch: R: 13 lbs, L: 14 lbs  9 Hole Peg Test: R: 26 sec, L: 50 sec  02/28/24: Grip grip: Right: 85 lbs; Left: 71 lbs Lateral pinch: R: 20 lbs, L: 19 lbs; 3 point pinch: R: 17 lbs, L: 18 lbs  9 Hole Peg Test: R: 29 sec, L: 40 sec  SENSATION: WFL  EDEMA: No visible edema in BUEs  MUSCLE TONE: BUEs  WNL  COGNITION: Overall cognitive status: Within functional limits for tasks assessed  VISION: wears glasses all the time, no reports of diplopia  PERCEPTION: Not tested  PRAXIS: WFL  OBSERVATIONS:  Pt pleasant, cooperative, and motivated to work towards improving indep with ADLs.  TREATMENT DATE: 03/13/24 Self Care: -STS trials at sink, with RW; min A to block R knee and to assist with forward WS.  Pt was able to achieve ~75% of erect standing position with RW 4x today.  Therapeutic Activity: -Facilitated core strengthening with pt sorting cards on table top, discarding cards by completing contralateral reaching without UE support.  Wc positioned away from table to promote extended reaching outside BOS forward and laterally, with return to midline after each reaching rep.  PATIENT EDUCATION: Education details: Company Secretary Person educated: Patient Education method: Explanation, Actor cues, and Verbal cues Education comprehension: verbalized understanding and returned demonstration  HOME EXERCISE PROGRAM: IR A/AAROM to promote shoulder flexibility for peri care/bathing  GOALS: Goals reviewed with patient? Yes  SHORT TERM GOALS: Target date: 12/25/23  Pt will perform bed bath with set up only. Baseline: Eval: Min-mod A for posterior washing; 09/28/23: Min A for posterior washing; 11/13/23: Able to manage bed bath but sister helps with thoroughness, per pt's preference; pt in agreement to trial bath sitting EOB or in wc before next OT session. Goal status: achieved/d/c  2.  Pt will utilize sliding board for wc<>drop arm commode transfer with SBA. Baseline: Eval: Currently using electric lift for transfer to [facility]; 09/28/23: Not yet completed at home but transfers to commode have been easier without sliding board d/t pt implementing a scoot pivot. Goal status: d/c (pt does not prefer  sliding board for commode transfer)  3.  Pt will be indep to perform HEP for maintaining BUE strength for ADLs and functional transfers. Baseline: Eval: HEP not yet initiated; 09/28/23: Pt is working on core stability exercises from wc, cane stretches for bilat shoulder IR, and tricep extension via wc pushups; 11/13/23: indep Goal status: achieved  LONG TERM GOALS: Target date: 02/05/24  Pt will perform sink bath with set up A. (Revised on 11/13/23 from min A to set up) Baseline: Eval: Currently bed bathing d/t inability to stand at sink without electric lift (lift does not fit into pt's bathroom); 09/28/23: still completing bed bath as pt is not yet able to stand; 11/13/23: Not yet attempted; pt encouraged to attempt before next OT session now that bed bath goal has been met; 12/20/23: Pt is now completing sponge bath with min A while seated EOB (caregiver assists with washing peri area d/t no bed rail to aid in R lateral lean); 01/24/24: Performing with set up on EOB, completing lateral weight shifts to wash posterior/peri area with wc in front of pt and bed rail for LUE support and foot of bed for RUE support  Goal status: achieved  2.  Pt will perform wc<>tub bench transfer with min A. Baseline: Eval: Unable; 09/28/23: Performed in OT clinic with min A, but not yet in the home Goal status: d/c (pt prefers sink bath)  3.  Pt will perform squat pivot transfer wc<>BSC with SBA.  Baseline: Eval: Eval: Currently using electric lift for transfer to [facility]; 09/28/23: Not yet completed in clinic with [facility], and using lift for [facility] in the home still; 11/13/23: Still using Camie lift at home and in clinic transfers are all scoot rather than squat pivots d/t LE weakness; 12/20/23: Pt reports that she plans to use her Camie lift until she can ambulate to her [facility] d/t limited space for wc in her desired location for [facility] at home. Goal status: d/c  4. Pt will perform scoot transfer wc<>toilet using grab bars with supv to allow  toileting in community  setting.  Baseline: Eval: Pt limits community outings d/t requiring use of lift for [facility] transfers; 09/28/23: Pt has performed 2 successful scoot pivot  Transfers to toilet in OT clinic with min A, extra time.  Further trials with clothing management on toilet needed, but pt can lower and hike  pants while sititng edge of mat via lateral leaning and min A to maintain core stability; 11/13/23: Pt performs with CGA and extra time; 12/19/23: Pt  performs with close supv on most attempts, occasional CGA; efficiency is improving; 01/24/24: ongoing improvements; able to complete in fewer scoots (3 scoots each way from wc<>toilet) d/t improve engagement of Les and improved anterior weight shift; occasional min guard-supv; pt completes with therapist in clinic, but not yet in other community settings with sister, and not yet voided on toilet in community. 02/28/24: has not trialed in community, completes with SBA    Goal status: ongoing  5.  Pt will complete seated clothing management with min A to enable voiding in handicapped stall within community. Baseline: Recert 11/13/23: Pt can transfer to toilet with increased time and effort, but has not yet attempted clothing management or voiding while seated on commode in community setting.  Pt can lower pants to knees while seated in wc, but requires mod A to hike over hips from seated position; 12/20/23: Pt fully lowers panties and shorts while seated on commode with supv, can hike clothing to her her upper thighs, transfers back to wc, and then performs a tricep extension from wc level to allow caregiver to hike clothing remainder of the way for purposes of EC (min A for clothing management component) Goal status: achieved  6.  Pt will tolerate standing x1 min with close supv and BUE support to manage clothing in prep for toileting. Baseline: Eval: Currently standing in electric lift only, or within parallel bars without lift; 09/28/23: Not yet  attempted; 11/13/23: Pt is able to perform static standing with heavy BUE support on parallel bars with PT; 12/19/22: Not attempted recently d/t new intermittent clonus in BLEs; 01/24/24: Tolerated standing 1 min today at sink with min A for ascent, OT assist to block R knee from buckling. 02/28/24: tolerates up to 1 min standing, difficulty achieving stand 2/2 fatigue from PT.  Goal status: in progress  7.  Pt will increase bilat grip strength in order to securely grasp walker sufficiently for functional transfers and ambulation.  Baseline: Recert 11/13/23: TBD (PT note read following OT session, indicating pt with difficulty securing items in hand);  11/15/23: Grip strength: R: 70 lbs, L: 63 lbs; 12/25/23: R 76 lbs, L 67 lbs; sufficient for functional transfers/amb  Goal status: achieved  8.  Pt will increase [facility] skills in order to manipulate small ADL supplies with reduced dropping as indicated by 20 sec or more improvement in 9 hole peg test. Baseline: Recert 11/13/23: 9 hole peg test TBD (PT note read following OT session, indicating pt with difficulty manipulating and securing items in hands); 11/15/23: Right: 25 sec, L 49 sec; 12/25/23: Right: 24 sec; Left: 3 trials: 54 sec, 1 min, 56 sec; 01/24/24: R: 26 sec, L: 50 sec. 02/28/24: L: 40 sec.  Goal status: in progress  ASSESSMENT: CLINICAL IMPRESSION: Standing attempts at sink countertop with walker for support.  Pt was able to achieve ~75% of erect standing position with RW 4x today with OT providing min A to block R knee and assist with forward WS.  Continued focus on core stability for improving  stability with ADLs and functional mobility; good tolerance to activities noted above.  Pt will continue to benefit from skilled OT to work towards above noted goals in OT poc, working to maximize indep with daily tasks while reducing burden of care on caregivers.   PERFORMANCE DEFICITS: in functional skills including ADLs, IADLs, strength, pain, flexibility,  Gross motor control, mobility, balance, body mechanics, endurance, and decreased knowledge of use of DME, and psychosocial skills including coping strategies, environmental adaptation, habits, and routines and behaviors.   IMPAIRMENTS: are limiting patient from ADLs, IADLs, and social participation.   CO-MORBIDITIES: has co-morbidities such as neuropathy, back pain, obesity, MS, HTN that affects occupational performance. Patient will benefit from skilled OT to address above impairments and improve overall function.  MODIFICATION OR ASSISTANCE TO COMPLETE EVALUATION: No modification of tasks or assist necessary to complete an evaluation.  OT OCCUPATIONAL PROFILE AND HISTORY: Detailed assessment: Review of records and additional review of physical, cognitive, psychosocial history related to current functional performance.  CLINICAL DECISION MAKING: Moderate - several treatment options, min-mod task modification necessary  REHAB POTENTIAL: Good  EVALUATION COMPLEXITY: Moderate  PLAN:  OT FREQUENCY: 2x/week  OT DURATION: 12 weeks  PLANNED INTERVENTIONS: 97168 OT Re-evaluation, 97535 self care/ADL training, 02889 therapeutic exercise, 97530 therapeutic activity, 97112 neuromuscular re-education, 97140 manual therapy, 97116 gait training, 02989 moist heat, 97010 cryotherapy, balance training, functional mobility training, psychosocial skills training, energy conservation, coping strategies training, patient/family education, and DME and/or AE instructions  RECOMMENDED OTHER SERVICES: None at this time (Pt currently receiving PT services in this clinic)  CONSULTED AND AGREED WITH PLAN OF CARE: Patient  PLAN FOR NEXT SESSION: see above  Inocente MARLA Blazing, OT 03/15/2024, 11:26 AM

## 2024-03-18 ENCOUNTER — Ambulatory Visit: Payer: PPO

## 2024-03-18 ENCOUNTER — Ambulatory Visit

## 2024-03-18 DIAGNOSIS — R269 Unspecified abnormalities of gait and mobility: Secondary | ICD-10-CM

## 2024-03-18 DIAGNOSIS — M6281 Muscle weakness (generalized): Secondary | ICD-10-CM | POA: Diagnosis not present

## 2024-03-18 DIAGNOSIS — G35D Multiple sclerosis, unspecified: Secondary | ICD-10-CM

## 2024-03-18 DIAGNOSIS — R262 Difficulty in walking, not elsewhere classified: Secondary | ICD-10-CM

## 2024-03-18 DIAGNOSIS — R278 Other lack of coordination: Secondary | ICD-10-CM

## 2024-03-18 DIAGNOSIS — R2689 Other abnormalities of gait and mobility: Secondary | ICD-10-CM

## 2024-03-18 DIAGNOSIS — R2681 Unsteadiness on feet: Secondary | ICD-10-CM

## 2024-03-18 NOTE — Therapy (Signed)
 OUTPATIENT PHYSICAL THERAPY NEURO TREATMENT   Patient Name: Lisa Williams MRN: [id number] DOB:Jul 06, 1960, 63 y.o., female Today's Date: 03/18/2024  PCP: Lenon Na  REFERRING PROVIDER: Lenon Na   END OF SESSION:  PT End of Session - 03/18/24 1020     Visit Number 69    Number of Visits 70    Date for Recertification  03/20/24   corrected   Progress Note Due on Visit 50    PT Start Time 1018    PT Stop Time 1100    PT Time Calculation (min) 42 min    Equipment Utilized During Treatment Gait belt    Activity Tolerance Patient tolerated treatment well;No increased pain    Behavior During Therapy [facility] for tasks assessed/performed          Past Medical History:  Diagnosis Date   Abdominal pain, right upper quadrant    Back pain    Calculus of kidney 12/09/2013   Chronic back pain    unspecified   Chronic left shoulder pain 07/19/2015   Complication of anesthesia    Functional disorder of bladder    other   Galactorrhea 11/26/2014   Chronic    Hereditary and idiopathic neuropathy 08/19/2013   High cholesterol    History of kidney stones    HPV test positive    Hypercholesteremia 08/19/2013   Hypertension    Hypertension    Hypothyroidism    Incomplete bladder emptying    Microscopic hematuria    MS (multiple sclerosis)    Muscle spasticity 05/21/2014   Nonspecific findings on examination of urine    other   Osteopenia    PONV (postoperative nausea and vomiting)    Prediabetes    Sickle cell trait    Status post laparoscopic supracervical hysterectomy 11/26/2014   Tobacco user 11/26/2014   Wrist fracture    Past Surgical History:  Procedure Laterality Date   bilateral tubal ligation  1996   BREAST CYST EXCISION Left 2002   CYST EXCISION Left 05/10/2022   Procedure: CYST REMOVAL;  Surgeon: Rodolph Romano, MD;  Location: [facility];  Service: General;  Laterality: Left;   FRACTURE SURGERY     KNEE SURGERY     right    LAPAROSCOPIC SUPRACERVICAL HYSTERECTOMY  08/05/2013   ORIF WRIST FRACTURE Left 01/17/2017   Procedure: OPEN REDUCTION INTERNAL FIXATION (ORIF) WRIST FRACTURE;  Surgeon: Leora Lynwood SAUNDERS, MD;  Location: [facility];  Service: Orthopedics;  Laterality: Left;   RADIOLOGY WITH ANESTHESIA N/A 03/18/2020   Procedure: MRI WITH ANESTHESIA CERVICAL SPINE AND BRAIN  WITH AND WITHOUT CONTRAST;  Surgeon: Radiologist, Medication, MD;  Location: [facility] OR;  Service: Radiology;  Laterality: N/A;   right tibia fracture Right    2024   TUBAL LIGATION Bilateral    VAGINAL HYSTERECTOMY  03/2006   VAGINAL HYSTERECTOMY     2014   Patient Active Problem List   Diagnosis Date Noted   SOB (shortness of breath) on exertion 03/12/2024   Hyperglycemia 01/09/2024   Abnormal LFTs (liver function tests) 09/17/2023   Sinus tachycardia 07/07/2023   Aortic atherosclerosis 01/18/2023   Prediabetes 01/18/2023   Hypokalemia 07/29/2022   Weakness of both lower extremities 07/29/2022   Abscess of left groin 11/15/2021   Abnormal LFTs 11/15/2021   Acute respiratory disease due to COVID-19 virus 11/21/2020   Fatigue due to excessive exertion    Hypoalbuminemia due to protein-calorie malnutrition    Neurogenic bowel    Neurogenic bladder    Labile  blood pressure    Neuropathic pain    Multiple sclerosis, relapsing-remitting 07/09/2019   Abscess of female pelvis    SVT (supraventricular tachycardia)    Radial styloid tenosynovitis 03/12/2018   Wheelchair dependence 02/27/2018   Localized osteoporosis with current pathological fracture with routine healing 01/19/2017   Wrist fracture 01/16/2017   Sprain of ankle 03/23/2016   Closed fracture of lateral malleolus 03/16/2016   Depression screening 01/24/2016   Blood pressure elevated without history of HTN 10/25/2015   Essential hypertension 10/25/2015   Multiple sclerosis 10/02/2015   Chronic left shoulder pain 07/19/2015   Multiple sclerosis exacerbation 07/14/2015    MS (multiple sclerosis) 11/26/2014   Increased body mass index 11/26/2014   HPV test positive 11/26/2014   Status post laparoscopic supracervical hysterectomy 11/26/2014   Galactorrhea 11/26/2014   Back ache 05/21/2014   Adiposity 05/21/2014   Disordered sleep 05/21/2014   Muscle spasticity 05/21/2014   Spasticity 05/21/2014   Calculus of kidney 12/09/2013   Renal colic 12/09/2013   Hypercholesteremia 08/19/2013   Hereditary and idiopathic neuropathy 08/19/2013   Hypercholesterolemia without hypertriglyceridemia 08/19/2013   Bladder infection, chronic 07/25/2012   Disorder of bladder function 07/25/2012   Incomplete bladder emptying 07/25/2012   Microscopic hematuria 07/25/2012   Right upper quadrant pain 07/25/2012    ONSET DATE: 1995  REFERRING DIAG: MS  THERAPY DIAG:  Muscle weakness (generalized)  Other lack of coordination  Multiple sclerosis exacerbation  Difficulty in walking, not elsewhere classified  Unsteadiness on feet  Abnormality of gait and mobility  Other abnormalities of gait and mobility  Rationale for Evaluation and Treatment: Rehabilitation  SUBJECTIVE:                                                                                                                                                                                             SUBJECTIVE STATEMENT:  Pt reports that she is feeling good.  Pt reports her standing frame is working better now and they fixed the issues she had with the wheel coming up.    Pt accompanied by: self  PERTINENT HISTORY:  Patient is returning to PT s/p hospitalization.  s/p ORIF of R tibia shaft fracture 09/23/2022. Patient has weakness in BLE with RLE>LLE. She drives with hand controls. Patient has been diagnosed with MS in 1995. PMH includes: back pain, CBP, chronic L shoulder pain, galactorrhea, neuropathy, HPV, hypercholestermeia, HTN, MS, osteopenia, PONV, wrist fracture. Additional order for other closed  fracture of proximal end of R tibia with routine healing. Still has to use [employer].   PAIN:  Are you having pain? Occasional pain in RLE;  primarily in knee  PRECAUTIONS: Fall  RED FLAGS: None   WEIGHT BEARING RESTRICTIONS: No  FALLS: Has patient fallen in last 6 months? No  LIVING ENVIRONMENT: Lives with: lives with their family Lives in: House/apartment Stairs: ramp Has following equipment at home: Vannie - 2 wheeled, Wheelchair (manual), shower chair, and Grab bars  PLOF: Independent with household mobility with device  PATIENT GOALS: to get her independence back. To be able to get into car, toilet, and get dressed independently  OBJECTIVE:  Note: Objective measures were completed at Evaluation unless otherwise noted.  DIAGNOSTIC FINDINGS: MRI of the brain 03/18/2020 showed multiple T2/FLAIR hyperintense foci in the periventricular, juxtacortical and deep white matter.  There were no infratentorial lesions noted.  None of the foci enhanced.   Due to severe claustrophobia, the study was done with conscious sedation in the hospital   COGNITION: Overall cognitive status: Within functional limits for tasks assessed   SENSATION: Lack of sensation in feet, loss in bilateral lateral aspect of knee  COORDINATION: Does not have the strength for functional LE heel slide test   MUSCLE TONE: BLE mild tone    POSTURE: rounded shoulders, forward head, anterior pelvic tilt, and weight shift left   LOWER EXTREMITY MMT:    MMT Right Eval Left Eval  Hip flexion 0.9 1.5  Hip extension    Hip abduction 1.8 2.6  Hip adduction 3.1 1.9  Hip internal rotation    Hip external rotation    Knee flexion 0.9 1.5  Knee extension 0.8 1.2  Ankle dorsiflexion    Ankle plantarflexion    Ankle inversion    Ankle eversion    (Blank rows = not tested)  BED MOBILITY:  Assess in future session due to limited time  TRANSFERS: Assistive device utilized: Bariatric RW with sit to stands,  slide board to table  Sit to stand: unable to reach full stand Stand to sit: unable to reach full stand  Chair to chair: slide board with CGA   GAIT: Unable to ambulate at this time.   FUNCTIONAL TESTS:  Sit to stand: tricep press with BUE; unable to bring arm to walker.    Function In Sitting Test (FIST)  (1/2 femur on surface; hips/knees flexed to 90deg)   - indicate bed or mat table / step stool if used  SCORING KEY: 4 = Independent (completes task independently & successfully) 3 = Verbal Cues/Increased Time (completes task independently & successfully and only needs more time/cues) 2 = Upper Extremity Support (must use UE for support or assistance to complete successfully) 1 = Needs Assistance (unable to complete w/o physical assist; DOCUMENT LEVEL: min, mod, max) 0 = Dependent (requires complete physical assist; unable to complete successfully even w/ physical assist)  Randomly Administer Once Throughout Exam  4 - Anterior Nudge (superior sternum)  4 - Posterior Nudge (between scapular spines)  4 - Lateral Nudge (to dominant side at acromion)     4 - Static sitting (30 seconds)  4 - Sitting, shake 'no' (left and right)  4 - Sitting, eyes closed (30 seconds)   0 - Sitting, lift foot (dominant side, lift foot 1 inch twice)    2 - Pick up object from behind (object at midline, hands breadth posterior)  3 - Forward reach (use dominant arm, must complete full motion) 2 - Lateral reach (use dominant arm, clear opposite ischial tuberosity) 2 - Pick up object from floor (from between feet)   2 - Posterior scooting (move backwards  2 inches)  2 - Anterior scooting (move forward 2 inches)  2 - Lateral scooting (move to dominant side 2 inches)    TOTAL = 39/56  Notes/comments: slide board tranfer to/from table   MCD > 5 points MCID for IP REHAB > 6 points  Function In Sitting Test (FIST) 08/14/23 (1/2 femur on surface; hips/knees flexed to 90deg)   - indicate bed or mat  table / step stool if used  SCORING KEY: 4 = Independent (completes task independently & successfully) 3 = Verbal Cues/Increased Time (completes task independently & successfully and only needs more time/cues) 2 = Upper Extremity Support (must use UE for support or assistance to complete successfully) 1 = Needs Assistance (unable to complete w/o physical assist; DOCUMENT LEVEL: min, mod, max) 0 = Dependent (requires complete physical assist; unable to complete successfully even w/ physical assist)  Randomly Administer Once Throughout Exam  4 - Anterior Nudge (superior sternum)  4 - Posterior Nudge (between scapular spines)  4 - Lateral Nudge (to dominant side at acromion)     4 - Static sitting (30 seconds)  4 - Sitting, shake 'no' (left and right)  4 - Sitting, eyes closed (30 seconds)   0 - Sitting, lift foot (dominant side, lift foot 1 inch twice)    4 - Pick up object from behind (object at midline, hands breadth posterior)  4 - Forward reach (use dominant arm, must complete full motion) 4 - Lateral reach (use dominant arm, clear opposite ischial tuberosity)  - Pick up object from floor (from between feet)   3 - Posterior scooting (move backwards 2 inches)  3 - Anterior scooting (move forward 2 inches)  3 - Lateral scooting (move to dominant side 2 inches)    TOTAL = 47/56  MCD > 5 points MCID for IP REHAB > 6 points   TREATMENT DATE: 03/18/24  TherAct:   Seated scapular retraction, 32.5#, 2x10 Seated chest press with straight bar, 15# total (7.5# each side), 2x10 Seated incline press with straight bar, no additional weight, 2x10    Gait:  Pt ambulated for a total of 3 attempts, 13' on first attempt, 33' on the second attempt, 7' on the third attempt (33' in total).  Pt utilized the bariatric FWW, pillow under the thighs for easier ability to come upright, and sock applied to the L foot to reduce friction of the foot in mobility.   Pt was unable to continue with  ambulation due to the fatigue and tone in the R LE.    Rest breaks given and pt's symptoms monitored throughout the session.     PATIENT EDUCATION: Education details: Pt educated throughout session about proper posture and technique with exercises. Improved exercise technique, movement at target joints, use of target muscles after min to mod verbal, visual, tactile cues.  Person educated: Patient Education method: Explanation, Demonstration, Tactile cues, and Verbal cues Education comprehension: verbalized understanding, returned demonstration, verbal cues required, tactile cues required, and needs further education  HOME EXERCISE PROGRAM: Stand 3x/day in stander   GOALS: Goals reviewed with patient? Yes  SHORT TERM GOALS: Target date: 08/08/2023  Patient will be independent in home exercise program to improve strength/mobility for better functional independence with ADLs.  Baseline: 4/8: compliance Goal status: MET    LONG TERM GOALS: Target date: 03/20/2024  Patient will tolerate five consecutive stands with UE support from wheelchair to standing to improve functional mobility with additional cushion and RW.  Baseline: unable to perform 4/8:unable  to perform full stand 5/20: perform next session 5/29: two full stands with CGA and cushion on her seat 7/31: able to stand 3 times from [facility]. With no addition cushion  in parallel bars. 12/27/2023= Will retest next session- concentrated on just standing on past 2 visits Goal status: Ongoing   2.  Patient will ambulate 10 ft with bRW with wheelchair follow.  Baseline:  unable to ambulate 4/8: unable to ambulate 5/20: able to ambulate length of // bars in previous session 5/27: ambulate 2.5 lengths of // bars with two seated rest breaks with min A for weight shift and wheelchair follow  7/31: able to ambulate in parallel bars 3 ft with min assist and +2 follow in WC> difficulty advancing the LLE on this day. 12/27/2023- Patient unable to take  a step forward today but was abel to take a step on previous visit- limited by clonus like trembling that has progressed over past month. Goal status: Ongoing   3.  Patient will improve FIST score >6 points to demonstrate improved stability and ability to perform ADLs.  Baseline:  3/6: 39/56 4/8: 47/ 56 5/20: 48/56 7/31:46/56 Goal status: MET  4.  Patient will perform toileting with mod I at home for improved independence.  Baseline: 3/6: requires assistance 4/8: requires dependence on machine 5/20: requires assistance with use of wipe buddy.  7/31: continues to require use of sara steady and wipe buddy for safety and hygiene. 12/27/2023- Patient reports that since she is unable to take steps that she continues to require use of sara steady and wipe buddy for safe personal hygiene. Goal status: ongoing   5. Patient will perform > 5 min of static or dynamic standing with BUE and Min/mod assist  for improved LE strength and pre-gait function to assist with toileting and overall standing ability for improve functional independence.  Baseline: 12/27/2023-Patient was able to stand for just over 3 min today. 01/10/24: Pt able to stand 1 min 38 sec 02/14/24: Pt able to >3 minutes at this time. Goal status: PROGRESSING    ASSESSMENT:  CLINICAL IMPRESSION:  Pt performed well with the tasks given today and was able to ambulate well, but still has difficulty with the R knee buckling when compared to several weeks ago.  Pt still continues to push forward safely and attempts to ambulate longer distances.  Pt performed the UE exercises well and will continue to improve with ambulation attempts, as well as UE strengthening necessary for transfers.   Pt will continue to benefit from skilled therapy to address remaining deficits in order to improve overall QoL and return to PLOF.        OBJECTIVE IMPAIRMENTS: Abnormal gait, cardiopulmonary status limiting activity, decreased activity tolerance, decreased  balance, decreased coordination, decreased endurance, decreased mobility, difficulty walking, decreased ROM, decreased strength, hypomobility, increased fascial restrictions, impaired perceived functional ability, impaired flexibility, impaired sensation, improper body mechanics, and postural dysfunction.   ACTIVITY LIMITATIONS: carrying, lifting, bending, sitting, standing, squatting, sleeping, stairs, transfers, bed mobility, continence, bathing, toileting, dressing, self feeding, reach over head, hygiene/grooming, locomotion level, and caring for others  PARTICIPATION LIMITATIONS: meal prep, cleaning, laundry, interpersonal relationship, driving, shopping, community activity, and church  PERSONAL FACTORS: Age, Past/current experiences, Time since onset of injury/illness/exacerbation, Transportation, and 3+ comorbidities: ack pain, CBP, chronic L shoulder pain, galactorrhea, neuropathy, HPV, hypercholestermeia, HTN, MS, osteopenia, PONV, wrist fracture are also affecting patient's functional outcome.   REHAB POTENTIAL: Good  CLINICAL DECISION MAKING: Evolving/moderate complexity  EVALUATION COMPLEXITY: Moderate  PLAN:  PT FREQUENCY: 2x/week  PT DURATION: 12 weeks  PLANNED INTERVENTIONS: [id number]- PT Re-evaluation, 97110-Therapeutic exercises, 97530- Therapeutic activity, 97112- Neuromuscular re-education, 97535- Self Care, [id number]- Manual therapy, [phone]- Gait training, [phone]- Orthotic Fit/training, [phone]- Canalith repositioning, [id number]- Aquatic Therapy, [phone]- Electrical stimulation (unattended), [phone]- Electrical stimulation (manual), [id number]- Ultrasound, [id number]- Traction (mechanical), Patient/Family education, Balance training, Stair training, Taping, Dry Needling, Joint mobilization, Joint manipulation, Spinal manipulation, Spinal mobilization, Scar mobilization, Compression bandaging, Vestibular training, Visual/preceptual remediation/compensation, Cognitive remediation, DME instructions,  Cryotherapy, Moist heat, and Biofeedback  PLAN FOR NEXT SESSION:  Strap stand frame with more dynamic activities Sit to stands; try supported walking, continue dynamic core stabilization training and seated reaching tasks to improve weight shifting.    Fonda Simpers, PT, DPT Physical Therapist - [facility]  03/18/24, 2:39 PM

## 2024-03-20 ENCOUNTER — Ambulatory Visit: Payer: PPO

## 2024-03-20 ENCOUNTER — Ambulatory Visit

## 2024-03-20 DIAGNOSIS — G35D Multiple sclerosis, unspecified: Secondary | ICD-10-CM

## 2024-03-20 DIAGNOSIS — R2681 Unsteadiness on feet: Secondary | ICD-10-CM

## 2024-03-20 DIAGNOSIS — M6281 Muscle weakness (generalized): Secondary | ICD-10-CM

## 2024-03-20 DIAGNOSIS — R262 Difficulty in walking, not elsewhere classified: Secondary | ICD-10-CM

## 2024-03-20 DIAGNOSIS — R269 Unspecified abnormalities of gait and mobility: Secondary | ICD-10-CM

## 2024-03-20 DIAGNOSIS — R278 Other lack of coordination: Secondary | ICD-10-CM

## 2024-03-20 DIAGNOSIS — R2689 Other abnormalities of gait and mobility: Secondary | ICD-10-CM

## 2024-03-20 NOTE — Therapy (Signed)
 OUTPATIENT PHYSICAL THERAPY NEURO TREATMENT   Patient Name: Lisa Williams MRN: [id number] DOB:1960-06-15, 63 y.o., female Today's Date: 03/20/2024  PCP: Lenon Na  REFERRING PROVIDER: Lenon Na   END OF SESSION:  PT End of Session - 03/20/24 1022     Visit Number 70    Number of Visits 94    Date for Recertification  06/12/24   corrected   Progress Note Due on Visit 50    PT Start Time 1017    PT Stop Time 1100    PT Time Calculation (min) 43 min    Equipment Utilized During Treatment Gait belt    Activity Tolerance Patient tolerated treatment well;No increased pain    Behavior During Therapy [facility] for tasks assessed/performed          Past Medical History:  Diagnosis Date   Abdominal pain, right upper quadrant    Back pain    Calculus of kidney 12/09/2013   Chronic back pain    unspecified   Chronic left shoulder pain 07/19/2015   Complication of anesthesia    Functional disorder of bladder    other   Galactorrhea 11/26/2014   Chronic    Hereditary and idiopathic neuropathy 08/19/2013   High cholesterol    History of kidney stones    HPV test positive    Hypercholesteremia 08/19/2013   Hypertension    Hypertension    Hypothyroidism    Incomplete bladder emptying    Microscopic hematuria    MS (multiple sclerosis)    Muscle spasticity 05/21/2014   Nonspecific findings on examination of urine    other   Osteopenia    PONV (postoperative nausea and vomiting)    Prediabetes    Sickle cell trait    Status post laparoscopic supracervical hysterectomy 11/26/2014   Tobacco user 11/26/2014   Wrist fracture    Past Surgical History:  Procedure Laterality Date   bilateral tubal ligation  1996   BREAST CYST EXCISION Left 2002   CYST EXCISION Left 05/10/2022   Procedure: CYST REMOVAL;  Surgeon: Rodolph Romano, MD;  Location: [facility] ORS;  Service: General;  Laterality: Left;   FRACTURE SURGERY     KNEE SURGERY     right    LAPAROSCOPIC SUPRACERVICAL HYSTERECTOMY  08/05/2013   ORIF WRIST FRACTURE Left 01/17/2017   Procedure: OPEN REDUCTION INTERNAL FIXATION (ORIF) WRIST FRACTURE;  Surgeon: Leora Lynwood SAUNDERS, MD;  Location: [facility] OR[facility];  Service: Orthopedics;  Laterality: Left;   RADIOLOGY WITH ANESTHESIA N/A 03/18/2020   Procedure: MRI WITH ANESTHESIA CERVICAL SPINE AND BRAIN  WITH AND WITHOUT CONTRAST;  Surgeon: Radiologist, Medication, MD;  Location: [facility] OR;  Service: Radiology;  Laterality: N/A;   right tibia fracture Right    2024   TUBAL LIGATION Bilateral    VAGINAL HYSTERECTOMY  03/2006   VAGINAL HYSTERECTOMY     2014   Patient Active Problem List   Diagnosis Date Noted   SOB (shortness of breath) on exertion 03/12/2024   Hyperglycemia 01/09/2024   Abnormal LFTs (liver function tests) 09/17/2023   Sinus tachycardia 07/07/2023   Aortic atherosclerosis 01/18/2023   Prediabetes 01/18/2023   Hypokalemia 07/29/2022   Weakness of both lower extremities 07/29/2022   Abscess of left groin 11/15/2021   Abnormal LFTs 11/15/2021   Acute respiratory disease due to COVID-19 virus 11/21/2020   Fatigue due to excessive exertion    Hypoalbuminemia due to protein-calorie malnutrition    Neurogenic bowel    Neurogenic bladder    Labile  blood pressure    Neuropathic pain    Multiple sclerosis, relapsing-remitting 07/09/2019   Abscess of female pelvis    SVT (supraventricular tachycardia)    Radial styloid tenosynovitis 03/12/2018   Wheelchair dependence 02/27/2018   Localized osteoporosis with current pathological fracture with routine healing 01/19/2017   Wrist fracture 01/16/2017   Sprain of ankle 03/23/2016   Closed fracture of lateral malleolus 03/16/2016   Depression screening 01/24/2016   Blood pressure elevated without history of HTN 10/25/2015   Essential hypertension 10/25/2015   Multiple sclerosis 10/02/2015   Chronic left shoulder pain 07/19/2015   Multiple sclerosis exacerbation 07/14/2015    MS (multiple sclerosis) 11/26/2014   Increased body mass index 11/26/2014   HPV test positive 11/26/2014   Status post laparoscopic supracervical hysterectomy 11/26/2014   Galactorrhea 11/26/2014   Back ache 05/21/2014   Adiposity 05/21/2014   Disordered sleep 05/21/2014   Muscle spasticity 05/21/2014   Spasticity 05/21/2014   Calculus of kidney 12/09/2013   Renal colic 12/09/2013   Hypercholesteremia 08/19/2013   Hereditary and idiopathic neuropathy 08/19/2013   Hypercholesterolemia without hypertriglyceridemia 08/19/2013   Bladder infection, chronic 07/25/2012   Disorder of bladder function 07/25/2012   Incomplete bladder emptying 07/25/2012   Microscopic hematuria 07/25/2012   Right upper quadrant pain 07/25/2012    ONSET DATE: 1995  REFERRING DIAG: MS  THERAPY DIAG:  Muscle weakness (generalized)  Other lack of coordination  Multiple sclerosis exacerbation  Difficulty in walking, not elsewhere classified  Unsteadiness on feet  Abnormality of gait and mobility  Other abnormalities of gait and mobility  Rationale for Evaluation and Treatment: Rehabilitation  SUBJECTIVE:                                                                                                                                                                                             SUBJECTIVE STATEMENT:  Pt reports that she is feeling good today.  Pt notes that she is doing well with the standing frame is ready to begin her day.  Pt accompanied by: self  PERTINENT HISTORY:  Patient is returning to PT s/p hospitalization.  s/p ORIF of R tibia shaft fracture 09/23/2022. Patient has weakness in BLE with RLE>LLE. She drives with hand controls. Patient has been diagnosed with MS in 1995. PMH includes: back pain, CBP, chronic L shoulder pain, galactorrhea, neuropathy, HPV, hypercholestermeia, HTN, MS, osteopenia, PONV, wrist fracture. Additional order for other closed fracture of proximal end of R  tibia with routine healing. Still has to use [employer].   PAIN:  Are you having pain? Occasional pain in RLE; primarily in knee  PRECAUTIONS:  Fall  RED FLAGS: None   WEIGHT BEARING RESTRICTIONS: No  FALLS: Has patient fallen in last 6 months? No  LIVING ENVIRONMENT: Lives with: lives with their family Lives in: House/apartment Stairs: ramp Has following equipment at home: Vannie - 2 wheeled, Wheelchair (manual), shower chair, and Grab bars  PLOF: Independent with household mobility with device  PATIENT GOALS: to get her independence back. To be able to get into car, toilet, and get dressed independently  OBJECTIVE:  Note: Objective measures were completed at Evaluation unless otherwise noted.  DIAGNOSTIC FINDINGS: MRI of the brain 03/18/2020 showed multiple T2/FLAIR hyperintense foci in the periventricular, juxtacortical and deep white matter.  There were no infratentorial lesions noted.  None of the foci enhanced.   Due to severe claustrophobia, the study was done with conscious sedation in the hospital   COGNITION: Overall cognitive status: Within functional limits for tasks assessed   SENSATION: Lack of sensation in feet, loss in bilateral lateral aspect of knee  COORDINATION: Does not have the strength for functional LE heel slide test   MUSCLE TONE: BLE mild tone    POSTURE: rounded shoulders, forward head, anterior pelvic tilt, and weight shift left   LOWER EXTREMITY MMT:    MMT Right Eval Left Eval  Hip flexion 0.9 1.5  Hip extension    Hip abduction 1.8 2.6  Hip adduction 3.1 1.9  Hip internal rotation    Hip external rotation    Knee flexion 0.9 1.5  Knee extension 0.8 1.2  Ankle dorsiflexion    Ankle plantarflexion    Ankle inversion    Ankle eversion    (Blank rows = not tested)  BED MOBILITY:  Assess in future session due to limited time  TRANSFERS: Assistive device utilized: Bariatric RW with sit to stands, slide board to table  Sit to  stand: unable to reach full stand Stand to sit: unable to reach full stand  Chair to chair: slide board with CGA   GAIT: Unable to ambulate at this time.   FUNCTIONAL TESTS:  Sit to stand: tricep press with BUE; unable to bring arm to walker.    Function In Sitting Test (FIST)  (1/2 femur on surface; hips/knees flexed to 90deg)   - indicate bed or mat table / step stool if used  SCORING KEY: 4 = Independent (completes task independently & successfully) 3 = Verbal Cues/Increased Time (completes task independently & successfully and only needs more time/cues) 2 = Upper Extremity Support (must use UE for support or assistance to complete successfully) 1 = Needs Assistance (unable to complete w/o physical assist; DOCUMENT LEVEL: min, mod, max) 0 = Dependent (requires complete physical assist; unable to complete successfully even w/ physical assist)  Randomly Administer Once Throughout Exam  4 - Anterior Nudge (superior sternum)  4 - Posterior Nudge (between scapular spines)  4 - Lateral Nudge (to dominant side at acromion)     4 - Static sitting (30 seconds)  4 - Sitting, shake 'no' (left and right)  4 - Sitting, eyes closed (30 seconds)   0 - Sitting, lift foot (dominant side, lift foot 1 inch twice)    2 - Pick up object from behind (object at midline, hands breadth posterior)  3 - Forward reach (use dominant arm, must complete full motion) 2 - Lateral reach (use dominant arm, clear opposite ischial tuberosity) 2 - Pick up object from floor (from between feet)   2 - Posterior scooting (move backwards 2 inches)  2 -  Anterior scooting (move forward 2 inches)  2 - Lateral scooting (move to dominant side 2 inches)    TOTAL = 39/56  Notes/comments: slide board tranfer to/from table   MCD > 5 points MCID for IP REHAB > 6 points  Function In Sitting Test (FIST) 08/14/23 (1/2 femur on surface; hips/knees flexed to 90deg)   - indicate bed or mat table / step stool if  used  SCORING KEY: 4 = Independent (completes task independently & successfully) 3 = Verbal Cues/Increased Time (completes task independently & successfully and only needs more time/cues) 2 = Upper Extremity Support (must use UE for support or assistance to complete successfully) 1 = Needs Assistance (unable to complete w/o physical assist; DOCUMENT LEVEL: min, mod, max) 0 = Dependent (requires complete physical assist; unable to complete successfully even w/ physical assist)  Randomly Administer Once Throughout Exam  4 - Anterior Nudge (superior sternum)  4 - Posterior Nudge (between scapular spines)  4 - Lateral Nudge (to dominant side at acromion)     4 - Static sitting (30 seconds)  4 - Sitting, shake 'no' (left and right)  4 - Sitting, eyes closed (30 seconds)   0 - Sitting, lift foot (dominant side, lift foot 1 inch twice)    4 - Pick up object from behind (object at midline, hands breadth posterior)  4 - Forward reach (use dominant arm, must complete full motion) 4 - Lateral reach (use dominant arm, clear opposite ischial tuberosity)  - Pick up object from floor (from between feet)   3 - Posterior scooting (move backwards 2 inches)  3 - Anterior scooting (move forward 2 inches)  3 - Lateral scooting (move to dominant side 2 inches)    TOTAL = 47/56  MCD > 5 points MCID for IP REHAB > 6 points   TREATMENT DATE: 03/20/24  TherAct:   Static stance in // bars for goal assessment.  Pt able to stand for 4 minutes with UE support on // bars    Gait:  Pt ambulated for a total of 2 attempts, 26' on first attempt, 23' 7 on the second attempt (49' 7 in total).  Pt utilized the bariatric FWW, pillow under the thighs for easier ability to come upright, and sock applied to the L foot to reduce friction of the foot in mobility.   Rest breaks given and pt's symptoms monitored throughout the session.   Self-Care Home Management:  Education regarding goals and the assessment  for each goal, along with the outcomes of goals tested during the session today.  Pt and therapist also continued with updating goals to fit pt's need at this time.     PATIENT EDUCATION: Education details: Pt educated throughout session about proper posture and technique with exercises. Improved exercise technique, movement at target joints, use of target muscles after min to mod verbal, visual, tactile cues.  Person educated: Patient Education method: Explanation, Demonstration, Tactile cues, and Verbal cues Education comprehension: verbalized understanding, returned demonstration, verbal cues required, tactile cues required, and needs further education  HOME EXERCISE PROGRAM: Stand 3x/day in stander   GOALS: Goals reviewed with patient? Yes  SHORT TERM GOALS: Target date: 08/08/2023  Patient will be independent in home exercise program to improve strength/mobility for better functional independence with ADLs.  Baseline: 4/8: compliance Goal status: MET    LONG TERM GOALS: Target date: 03/20/2024  Patient will tolerate five consecutive stands with UE support from wheelchair to standing to improve functional mobility with additional  cushion and RW.  Baseline: unable to perform 4/8:unable to perform full stand 5/20: perform next session 5/29: two full stands with CGA and cushion on her seat 7/31: able to stand 3 times from [facility]. With no addition cushion  in parallel bars. 12/27/2023= Will retest next session- concentrated on just standing on past 2 visits 03/20/24:  Goal status: Ongoing   2.  Patient will ambulate 10 ft with bRW with wheelchair follow.  Baseline:  unable to ambulate 4/8: unable to ambulate 5/20: able to ambulate length of // bars in previous session 5/27: ambulate 2.5 lengths of // bars with two seated rest breaks with min A for weight shift and wheelchair follow  7/31: able to ambulate in parallel bars 3 ft with min assist and +2 follow in WC> difficulty advancing the  LLE on this day. 12/27/2023- Patient unable to take a step forward today but was abel to take a step on previous visit- limited by clonus like trembling that has progressed over past month. 03/20/24: 26' at one time Goal status: MET   3.  Patient will improve FIST score >6 points to demonstrate improved stability and ability to perform ADLs.  Baseline:  3/6: 39/56 4/8: 47/ 56 5/20: 48/56 7/31:46/56 Goal status: MET  4.  Patient will perform toileting with mod I at home for improved independence.  Baseline: 3/6: requires assistance 4/8: requires dependence on machine 5/20: requires assistance with use of wipe buddy.  7/31: continues to require use of sara steady and wipe buddy for safety and hygiene. 12/27/2023- Patient reports that since she is unable to take steps that she continues to require use of sara steady and wipe buddy for safe personal hygiene. 03/20/24: Pt utilizing sara stedy to perform safe personal hygiene, however is no longer utilizing the wipe buddy. Goal status: PROGRESSING   5. Patient will perform > 5 min of static or dynamic standing with BUE and Min/mod assist  for improved LE strength and pre-gait function to assist with toileting and overall standing ability for improve functional independence.  Baseline: 12/27/2023-Patient was able to stand for just over 3 min today. 01/10/24: Pt able to stand 1 min 38 sec 02/14/24: Pt able to >3 minutes at this time. 03/20/24: Pt able to achieve >4 minutes at this time. Goal status: PROGRESSING  6.  Patient will ambulate 40 ft with BRW with wheelchair follow, in one attempt, without rest break. Baseline: 81' during one attempt. Goal status: NEW   ASSESSMENT:  CLINICAL IMPRESSION:  Pt performed well with the tasks given and was able to improve upon her goals as updated above.  Pt noted to be able to stand >4 total minutes in the // bars, as well as progress her ability to ambulate.  The goals will be updated in order to establish more  difficult challenges for the pt to attain during future sessions.  Pt encouraged to continue to work on standing tolerance at home with the standing frame as part of HEP.   Pt will continue to benefit from skilled therapy to address remaining deficits in order to improve overall QoL and return to PLOF.         OBJECTIVE IMPAIRMENTS: Abnormal gait, cardiopulmonary status limiting activity, decreased activity tolerance, decreased balance, decreased coordination, decreased endurance, decreased mobility, difficulty walking, decreased ROM, decreased strength, hypomobility, increased fascial restrictions, impaired perceived functional ability, impaired flexibility, impaired sensation, improper body mechanics, and postural dysfunction.   ACTIVITY LIMITATIONS: carrying, lifting, bending, sitting, standing, squatting, sleeping, stairs,  transfers, bed mobility, continence, bathing, toileting, dressing, self feeding, reach over head, hygiene/grooming, locomotion level, and caring for others  PARTICIPATION LIMITATIONS: meal prep, cleaning, laundry, interpersonal relationship, driving, shopping, community activity, and church  PERSONAL FACTORS: Age, Past/current experiences, Time since onset of injury/illness/exacerbation, Transportation, and 3+ comorbidities: ack pain, CBP, chronic L shoulder pain, galactorrhea, neuropathy, HPV, hypercholestermeia, HTN, MS, osteopenia, PONV, wrist fracture are also affecting patient's functional outcome.   REHAB POTENTIAL: Good  CLINICAL DECISION MAKING: Evolving/moderate complexity  EVALUATION COMPLEXITY: Moderate  PLAN:  PT FREQUENCY: 2x/week  PT DURATION: 12 weeks  PLANNED INTERVENTIONS: 97164- PT Re-evaluation, 97110-Therapeutic exercises, 97530- Therapeutic activity, 97112- Neuromuscular re-education, 97535- Self Care, [id number]- Manual therapy, [phone]- Gait training, [phone]- Orthotic Fit/training, [phone]- Canalith repositioning, [id number]- Aquatic Therapy, [phone]-  Electrical stimulation (unattended), [phone]- Electrical stimulation (manual), [id number]- Ultrasound, [id number]- Traction (mechanical), Patient/Family education, Balance training, Stair training, Taping, Dry Needling, Joint mobilization, Joint manipulation, Spinal manipulation, Spinal mobilization, Scar mobilization, Compression bandaging, Vestibular training, Visual/preceptual remediation/compensation, Cognitive remediation, DME instructions, Cryotherapy, Moist heat, and Biofeedback  PLAN FOR NEXT SESSION:  Strap stand frame with more dynamic activities Sit to stands; try supported walking, continue dynamic core stabilization training and seated reaching tasks to improve weight shifting.    Fonda Simpers, PT, DPT Physical Therapist - [facility]  03/20/24, 1:03 PM

## 2024-03-22 NOTE — Therapy (Signed)
 OUTPATIENT OCCUPATIONAL THERAPY NEURO TREATMENT NOTE  Patient Name: Lisa Williams MRN: [id number] DOB:10/16/1960, 63 y.o., female Today's Date: 03/22/2024  PCP: Dr. Layman Piety   Neurologist Dr. Suanne at [facility] REFERRING PROVIDER: Dr. Layman Piety    END OF SESSION:  OT End of Session - 03/22/24 1746     Visit Number 55    Number of Visits 67    Date for Recertification  04/29/24    Authorization Time Period Reporting period beginning 02/28/24    Progress Note Due on Visit 60    OT Start Time 1100    OT Stop Time 1145    OT Time Calculation (min) 45 min    Equipment Utilized During Treatment manual wc    Activity Tolerance Patient tolerated treatment well    Behavior During Therapy [facility] for tasks assessed/performed         Past Medical History:  Diagnosis Date   Abdominal pain, right upper quadrant    Back pain    Calculus of kidney 12/09/2013   Chronic back pain    unspecified   Chronic left shoulder pain 07/19/2015   Complication of anesthesia    Functional disorder of bladder    other   Galactorrhea 11/26/2014   Chronic    Hereditary and idiopathic neuropathy 08/19/2013   High cholesterol    History of kidney stones    HPV test positive    Hypercholesteremia 08/19/2013   Hypertension    Hypertension    Hypothyroidism    Incomplete bladder emptying    Microscopic hematuria    MS (multiple sclerosis)    Muscle spasticity 05/21/2014   Nonspecific findings on examination of urine    other   Osteopenia    PONV (postoperative nausea and vomiting)    Prediabetes    Sickle cell trait    Status post laparoscopic supracervical hysterectomy 11/26/2014   Tobacco user 11/26/2014   Wrist fracture    Past Surgical History:  Procedure Laterality Date   bilateral tubal ligation  1996   BREAST CYST EXCISION Left 2002   CYST EXCISION Left 05/10/2022   Procedure: CYST REMOVAL;  Surgeon: Rodolph Romano, MD;  Location: [facility] ORS;  Service:  General;  Laterality: Left;   FRACTURE SURGERY     KNEE SURGERY     right   LAPAROSCOPIC SUPRACERVICAL HYSTERECTOMY  08/05/2013   ORIF WRIST FRACTURE Left 01/17/2017   Procedure: OPEN REDUCTION INTERNAL FIXATION (ORIF) WRIST FRACTURE;  Surgeon: Leora Lynwood SAUNDERS, MD;  Location: [facility] ORS;  Service: Orthopedics;  Laterality: Left;   RADIOLOGY WITH ANESTHESIA N/A 03/18/2020   Procedure: MRI WITH ANESTHESIA CERVICAL SPINE AND BRAIN  WITH AND WITHOUT CONTRAST;  Surgeon: Radiologist, Medication, MD;  Location: [facility] OR;  Service: Radiology;  Laterality: N/A;   right tibia fracture Right    2024   TUBAL LIGATION Bilateral    VAGINAL HYSTERECTOMY  03/2006   VAGINAL HYSTERECTOMY     2014   Patient Active Problem List   Diagnosis Date Noted   SOB (shortness of breath) on exertion 03/12/2024   Hyperglycemia 01/09/2024   Abnormal LFTs (liver function tests) 09/17/2023   Sinus tachycardia 07/07/2023   Aortic atherosclerosis 01/18/2023   Prediabetes 01/18/2023   Hypokalemia 07/29/2022   Weakness of both lower extremities 07/29/2022   Abscess of left groin 11/15/2021   Abnormal LFTs 11/15/2021   Acute respiratory disease due to COVID-19 virus 11/21/2020   Fatigue due to excessive exertion    Hypoalbuminemia due to protein-calorie malnutrition  Neurogenic bowel    Neurogenic bladder    Labile blood pressure    Neuropathic pain    Multiple sclerosis, relapsing-remitting 07/09/2019   Abscess of female pelvis    SVT (supraventricular tachycardia)    Radial styloid tenosynovitis 03/12/2018   Wheelchair dependence 02/27/2018   Localized osteoporosis with current pathological fracture with routine healing 01/19/2017   Wrist fracture 01/16/2017   Sprain of ankle 03/23/2016   Closed fracture of lateral malleolus 03/16/2016   Depression screening 01/24/2016   Blood pressure elevated without history of HTN 10/25/2015   Essential hypertension 10/25/2015   Multiple sclerosis 10/02/2015   Chronic  left shoulder pain 07/19/2015   Multiple sclerosis exacerbation 07/14/2015   MS (multiple sclerosis) 11/26/2014   Increased body mass index 11/26/2014   HPV test positive 11/26/2014   Status post laparoscopic supracervical hysterectomy 11/26/2014   Galactorrhea 11/26/2014   Back ache 05/21/2014   Adiposity 05/21/2014   Disordered sleep 05/21/2014   Muscle spasticity 05/21/2014   Spasticity 05/21/2014   Calculus of kidney 12/09/2013   Renal colic 12/09/2013   Hypercholesteremia 08/19/2013   Hereditary and idiopathic neuropathy 08/19/2013   Hypercholesterolemia without hypertriglyceridemia 08/19/2013   Bladder infection, chronic 07/25/2012   Disorder of bladder function 07/25/2012   Incomplete bladder emptying 07/25/2012   Microscopic hematuria 07/25/2012   Right upper quadrant pain 07/25/2012   ONSET DATE: 5/24 Amon and broke R leg which caused beginning of ADL decline; diagnosed with MS in 1995)  REFERRING DIAG: MS  THERAPY DIAG:  Muscle weakness (generalized)  Other lack of coordination  Multiple sclerosis exacerbation  Rationale for Evaluation and Treatment: Rehabilitation  SUBJECTIVE:  SUBJECTIVE STATEMENT: Pt reports doing well today. Pt accompanied by: self  PERTINENT HISTORY: Pt reports increased difficulty with basic self care tasks since breaking her R leg last May.  Since that time, mobility and ADLs have declined, with pt requiring assist from private caregivers and family to manage bathing, toileting, dressing, and functional transfers.  PRECAUTIONS: Fall  WEIGHT BEARING RESTRICTIONS: No  PAIN: 03/18/24: No pain today  Are you having pain? No; occasional pain in R lower back, but not today  FALLS: Has patient fallen in last 6 months? No Last fall was May 17 of 2024  LIVING ENVIRONMENT: Lives with: lives with their family (including mother who has dementia and sister [name])  Lives in: 2 level home but pt resides on main level Stairs: ramp  Has  following equipment at home: Wheelchair (manual), Shower bench, and hand held shower shower, 1 grab bar in the shower, 3in1 commode, sit to stand lift (electric), FWW, hospital bed  PLOF: modified indep with ADL/IADLs prior to May of 7975   PATIENT GOALS: Increase independence with basic self care tasks  OBJECTIVE:  Note: Objective measures were completed at Evaluation unless otherwise noted.  HAND DOMINANCE: Right  ADLs:  Overall ADLs: assist provided from paid caregiver and sister Transfers/ambulation related to ADLs: sit to stand lift for all transfers, set up for sliding board in/out of bed  Eating: indep  Grooming: modified indep (wc level in mom's bathroom)  UB Dressing: set up (can't access her bedroom closet)  LB Dressing: able to sit on side of bed to don all LB clothing; assist to hike pants/underwear Toileting: assist with clothing management d/t standing within electric lift Bathing: bed bath with sister helping to wash backside Tub Shower transfers: N/A; pt has walk in shower in mom's bathroom (wc does fit in this bathroom but pt reports inability  to transfer from wc and requires arm rests on a bench for a successful transfer attempt from any DME).  Tub bench is in pt's bathroom, but lift does not fit through bathroom doorway and pt reports inability to transfer up from tub bench (currently unable to manage either transfer)   Equipment: see above  IADLs: Shopping: [employer] for shopping Light housekeeping: modified indep from wc level Meal Prep: modified indep from wc level Community mobility: relies on community transportation or family members Medication management: indep Landscape architect: indep Handwriting: NT; pt denies any [facility] challenges  MOBILITY STATUS: Hx of falls  POSTURE COMMENTS:  Rounded shoulders, forward head, anterior pelvic tilt, and weight shift left   ACTIVITY TOLERANCE: Eval: Activity tolerance: Per PT; currently standing 30-60 sec within  Light Gait harness/lift; currently non-ambulatory  01/24/24: Standing at sink: 1 minute; min A and 5 attempts to achieve fully erect standing position with BUEs supported on sink countertop, OT assist to block R knee to prevent buckling   02/28/24: x4 attempts, achieves 75% upright standing with MIN A to block knees.   UPPER EXTREMITY ROM:  BUEs WFL  UPPER EXTREMITY MMT:     MMT Right eval Left eval  Shoulder flexion 4+ 4  Shoulder abduction 4+ 4  Shoulder adduction    Shoulder extension    Shoulder internal rotation 4+ 4+  Shoulder external rotation 4 4  Middle trapezius    Lower trapezius    Elbow flexion 4+ 4+  Elbow extension 4+ 4+  Wrist flexion 4+ 4+  Wrist extension 4+ 4+  Wrist ulnar deviation    Wrist radial deviation    Wrist pronation    Wrist supination    (Blank rows = not tested)  HAND FUNCTION/COORDINATION:  R/L WNL; pt denies any coordination deficits/ able to manipulate pills/clothing fasteners/etc without difficulty  11/15/23: *Measures taken d/t PT note revealing pt with increased difficulty manipulating smaller ADL supplies during reaching activities in PT session   11/15/23: Grip strength: R: 70 lbs, L: 63 lbs Pinch strength: Lateral: R: 16 lbs, L: 15 lbs; 3 point pinch: R: 12 lbs, L: 13 lbs 9 hole peg test: Right: 25 sec, L 49 sec   12/25/23: Grip strength: Right: 70 lbs; Left: 65 lbs  9 hole peg test: Right: 24 sec; Left: 3 trials: 54 sec, 1 min, 56 sec  01/24/24: Grip grip: Right: 76 lbs; Left: 67 lbs Lateral pinch: R: 17 lbs, L: 17 lbs; 3 point pinch: R: 13 lbs, L: 14 lbs  9 Hole Peg Test: R: 26 sec, L: 50 sec  02/28/24: Grip grip: Right: 85 lbs; Left: 71 lbs Lateral pinch: R: 20 lbs, L: 19 lbs; 3 point pinch: R: 17 lbs, L: 18 lbs  9 Hole Peg Test: R: 29 sec, L: 40 sec  SENSATION: WFL  EDEMA: No visible edema in BUEs  MUSCLE TONE: BUEs WNL  COGNITION: Overall cognitive status: Within functional limits for tasks assessed  VISION: wears  glasses all the time, no reports of diplopia  PERCEPTION: Not tested  PRAXIS: WFL  OBSERVATIONS:  Pt pleasant, cooperative, and motivated to work towards improving indep with ADLs.  TREATMENT DATE: 03/18/24 Self Care: -STS trials at elevated mat table with RW; min A to block R knee and mod vc for forward WS.  Pt was able to achieve ~50% of erect standing position with RW on 7/10 trials today.  Foam mat placed to pt's L side for higher supportive surface to push from.  Therapeutic Activity: -Facilitated core strengthening with ball toss/catch without support of wc arm rests and pt reaching minimally outside BOS laterally R/L to catch ball.  PATIENT EDUCATION: Education details: Company Secretary Person educated: Patient Education method: Explanation, Actor cues, and Verbal cues Education comprehension: verbalized understanding and returned demonstration  HOME EXERCISE PROGRAM: IR A/AAROM to promote shoulder flexibility for peri care/bathing  GOALS: Goals reviewed with patient? Yes  SHORT TERM GOALS: Target date: 12/25/23  Pt will perform bed bath with set up only. Baseline: Eval: Min-mod A for posterior washing; 09/28/23: Min A for posterior washing; 11/13/23: Able to manage bed bath but sister helps with thoroughness, per pt's preference; pt in agreement to trial bath sitting EOB or in wc before next OT session. Goal status: achieved/d/c  2.  Pt will utilize sliding board for wc<>drop arm commode transfer with SBA. Baseline: Eval: Currently using electric lift for transfer to [facility]; 09/28/23: Not yet completed at home but transfers to commode have been easier without sliding board d/t pt implementing a scoot pivot. Goal status: d/c (pt does not prefer sliding board for commode transfer)  3.  Pt will be indep to perform HEP for maintaining BUE strength for ADLs and functional  transfers. Baseline: Eval: HEP not yet initiated; 09/28/23: Pt is working on core stability exercises from wc, cane stretches for bilat shoulder IR, and tricep extension via wc pushups; 11/13/23: indep Goal status: achieved  LONG TERM GOALS: Target date: 02/05/24  Pt will perform sink bath with set up A. (Revised on 11/13/23 from min A to set up) Baseline: Eval: Currently bed bathing d/t inability to stand at sink without electric lift (lift does not fit into pt's bathroom); 09/28/23: still completing bed bath as pt is not yet able to stand; 11/13/23: Not yet attempted; pt encouraged to attempt before next OT session now that bed bath goal has been met; 12/20/23: Pt is now completing sponge bath with min A while seated EOB (caregiver assists with washing peri area d/t no bed rail to aid in R lateral lean); 01/24/24: Performing with set up on EOB, completing lateral weight shifts to wash posterior/peri area with wc in front of pt and bed rail for LUE support and foot of bed for RUE support  Goal status: achieved  2.  Pt will perform wc<>tub bench transfer with min A. Baseline: Eval: Unable; 09/28/23: Performed in OT clinic with min A, but not yet in the home Goal status: d/c (pt prefers sink bath)  3.  Pt will perform squat pivot transfer wc<>BSC with SBA.  Baseline: Eval: Eval: Currently using electric lift for transfer to [facility]; 09/28/23: Not yet completed in clinic with [facility], and using lift for [facility] in the home still; 11/13/23: Still using Camie lift at home and in clinic transfers are all scoot rather than squat pivots d/t LE weakness; 12/20/23: Pt reports that she plans to use her Camie lift until she can ambulate to her [facility] d/t limited space for wc in her desired location for [facility] at home. Goal status: d/c  4. Pt will perform scoot transfer wc<>toilet using grab bars with supv to allow toileting in community setting.  Baseline: Eval: Pt limits community outings d/t requiring use of lift for [facility] transfers;  09/28/23: Pt has performed 2 successful scoot pivot  Transfers to toilet in OT clinic with min A, extra time.  Further trials with clothing management on toilet needed, but pt can lower and hike  pants while sititng edge of mat via lateral leaning and min A to maintain core stability; 11/13/23: Pt performs with CGA and extra time; 12/19/23: Pt  performs with close supv on most attempts, occasional CGA; efficiency is improving; 01/24/24: ongoing improvements; able to complete in fewer scoots (3 scoots each way from wc<>toilet) d/t improve engagement of Les and improved anterior weight shift; occasional min guard-supv; pt completes with therapist in clinic, but not yet in other community settings with sister, and not yet voided on toilet in community. 02/28/24: has not trialed in community, completes with SBA    Goal status: ongoing  5.  Pt will complete seated clothing management with min A to enable voiding in handicapped stall within community. Baseline: Recert 11/13/23: Pt can transfer to toilet with increased time and effort, but has not yet attempted clothing management or voiding while seated on commode in community setting.  Pt can lower pants to knees while seated in wc, but requires mod A to hike over hips from seated position; 12/20/23: Pt fully lowers panties and shorts while seated on commode with supv, can hike clothing to her her upper thighs, transfers back to wc, and then performs a tricep extension from wc level to allow caregiver to hike clothing remainder of the way for purposes of EC (min A for clothing management component) Goal status: achieved  6.  Pt will tolerate standing x1 min with close supv and BUE support to manage clothing in prep for toileting. Baseline: Eval: Currently standing in electric lift only, or within parallel bars without lift; 09/28/23: Not yet attempted; 11/13/23: Pt is able to perform static standing with heavy BUE support on parallel bars with PT; 12/19/22: Not attempted  recently d/t new intermittent clonus in BLEs; 01/24/24: Tolerated standing 1 min today at sink with min A for ascent, OT assist to block R knee from buckling. 02/28/24: tolerates up to 1 min standing, difficulty achieving stand 2/2 fatigue from PT.  Goal status: in progress  7.  Pt will increase bilat grip strength in order to securely grasp walker sufficiently for functional transfers and ambulation.  Baseline: Recert 11/13/23: TBD (PT note read following OT session, indicating pt with difficulty securing items in hand);  11/15/23: Grip strength: R: 70 lbs, L: 63 lbs; 12/25/23: R 76 lbs, L 67 lbs; sufficient for functional transfers/amb  Goal status: achieved  8.  Pt will increase [facility] skills in order to manipulate small ADL supplies with reduced dropping as indicated by 20 sec or more improvement in 9 hole peg test. Baseline: Recert 11/13/23: 9 hole peg test TBD (PT note read following OT session, indicating pt with difficulty manipulating and securing items in hands); 11/15/23: Right: 25 sec, L 49 sec; 12/25/23: Right: 24 sec; Left: 3 trials: 54 sec, 1 min, 56 sec; 01/24/24: R: 26 sec, L: 50 sec. 02/28/24: L: 40 sec.  Goal status: in progress  ASSESSMENT: CLINICAL IMPRESSION: Standing attempts at elevated mat table with walker for support.  Pt was able to achieve ~50% of erect standing position with RW on 7/10 trials today.  Foam mat placed to pt's L side for higher supportive surface to push from.  Continued focus on core  stability for improving stability with ADLs and functional mobility; good tolerance to activities noted above.  Pt will continue to benefit from skilled OT to work towards above noted goals in OT poc, working to maximize indep with daily tasks while reducing burden of care on caregivers.   PERFORMANCE DEFICITS: in functional skills including ADLs, IADLs, strength, pain, flexibility, Gross motor control, mobility, balance, body mechanics, endurance, and decreased knowledge of use of DME,  and psychosocial skills including coping strategies, environmental adaptation, habits, and routines and behaviors.   IMPAIRMENTS: are limiting patient from ADLs, IADLs, and social participation.   CO-MORBIDITIES: has co-morbidities such as neuropathy, back pain, obesity, MS, HTN that affects occupational performance. Patient will benefit from skilled OT to address above impairments and improve overall function.  MODIFICATION OR ASSISTANCE TO COMPLETE EVALUATION: No modification of tasks or assist necessary to complete an evaluation.  OT OCCUPATIONAL PROFILE AND HISTORY: Detailed assessment: Review of records and additional review of physical, cognitive, psychosocial history related to current functional performance.  CLINICAL DECISION MAKING: Moderate - several treatment options, min-mod task modification necessary  REHAB POTENTIAL: Good  EVALUATION COMPLEXITY: Moderate  PLAN:  OT FREQUENCY: 2x/week  OT DURATION: 12 weeks  PLANNED INTERVENTIONS: 97168 OT Re-evaluation, 97535 self care/ADL training, 02889 therapeutic exercise, 97530 therapeutic activity, 97112 neuromuscular re-education, 97140 manual therapy, 97116 gait training, 02989 moist heat, 97010 cryotherapy, balance training, functional mobility training, psychosocial skills training, energy conservation, coping strategies training, patient/family education, and DME and/or AE instructions  RECOMMENDED OTHER SERVICES: None at this time (Pt currently receiving PT services in this clinic)  CONSULTED AND AGREED WITH PLAN OF CARE: Patient  PLAN FOR NEXT SESSION: see above  Inocente MARLA Blazing, OT 03/22/2024, 5:47 PM

## 2024-03-23 NOTE — Therapy (Signed)
 OUTPATIENT OCCUPATIONAL THERAPY NEURO TREATMENT NOTE  Patient Name: Lisa Williams MRN: [id number] DOB:[birth year], 62 y.o., female Today's Date: 03/23/2024  PCP: Dr. Layman Piety   Neurologist Dr. Suanne at [facility] REFERRING PROVIDER: Dr. Layman Piety    END OF SESSION:  OT End of Session - 03/23/24 1322     Visit Number 56    Number of Visits 67    Date for Recertification  04/29/24    Authorization Time Period Reporting period beginning 02/28/24    Progress Note Due on Visit 60    OT Start Time 1100    OT Stop Time 1145    OT Time Calculation (min) 45 min    Equipment Utilized During Treatment manual wc    Activity Tolerance Patient tolerated treatment well    Behavior During Therapy [facility] for tasks assessed/performed         Past Medical History:  Diagnosis Date   Abdominal pain, right upper quadrant    Back pain    Calculus of kidney 12/09/2013   Chronic back pain    unspecified   Chronic left shoulder pain 07/19/2015   Complication of anesthesia    Functional disorder of bladder    other   Galactorrhea 11/26/2014   Chronic    Hereditary and idiopathic neuropathy 08/19/2013   High cholesterol    History of kidney stones    HPV test positive    Hypercholesteremia 08/19/2013   Hypertension    Hypertension    Hypothyroidism    Incomplete bladder emptying    Microscopic hematuria    MS (multiple sclerosis)    Muscle spasticity 05/21/2014   Nonspecific findings on examination of urine    other   Osteopenia    PONV (postoperative nausea and vomiting)    Prediabetes    Sickle cell trait    Status post laparoscopic supracervical hysterectomy 11/26/2014   Tobacco user 11/26/2014   Wrist fracture    Past Surgical History:  Procedure Laterality Date   bilateral tubal ligation  1996   BREAST CYST EXCISION Left 2002   CYST EXCISION Left 05/10/2022   Procedure: CYST REMOVAL;  Surgeon: Rodolph Romano, MD;  Location: [facility] ORS;  Service:  General;  Laterality: Left;   FRACTURE SURGERY     KNEE SURGERY     right   LAPAROSCOPIC SUPRACERVICAL HYSTERECTOMY  08/05/2013   ORIF WRIST FRACTURE Left 01/17/2017   Procedure: OPEN REDUCTION INTERNAL FIXATION (ORIF) WRIST FRACTURE;  Surgeon: Leora Lynwood SAUNDERS, MD;  Location: [facility] ORS;  Service: Orthopedics;  Laterality: Left;   RADIOLOGY WITH ANESTHESIA N/A 03/18/2020   Procedure: MRI WITH ANESTHESIA CERVICAL SPINE AND BRAIN  WITH AND WITHOUT CONTRAST;  Surgeon: Radiologist, Medication, MD;  Location: [facility] OR;  Service: Radiology;  Laterality: N/A;   right tibia fracture Right    2024   TUBAL LIGATION Bilateral    VAGINAL HYSTERECTOMY  03/2006   VAGINAL HYSTERECTOMY     2014   Patient Active Problem List   Diagnosis Date Noted   SOB (shortness of breath) on exertion 03/12/2024   Hyperglycemia 01/09/2024   Abnormal LFTs (liver function tests) 09/17/2023   Sinus tachycardia 07/07/2023   Aortic atherosclerosis 01/18/2023   Prediabetes 01/18/2023   Hypokalemia 07/29/2022   Weakness of both lower extremities 07/29/2022   Abscess of left groin 11/15/2021   Abnormal LFTs 11/15/2021   Acute respiratory disease due to COVID-19 virus 11/21/2020   Fatigue due to excessive exertion    Hypoalbuminemia due to protein-calorie malnutrition  Neurogenic bowel    Neurogenic bladder    Labile blood pressure    Neuropathic pain    Multiple sclerosis, relapsing-remitting 07/09/2019   Abscess of female pelvis    SVT (supraventricular tachycardia)    Radial styloid tenosynovitis 03/12/2018   Wheelchair dependence 02/27/2018   Localized osteoporosis with current pathological fracture with routine healing 01/19/2017   Wrist fracture 01/16/2017   Sprain of ankle 03/23/2016   Closed fracture of lateral malleolus 03/16/2016   Depression screening 01/24/2016   Blood pressure elevated without history of HTN 10/25/2015   Essential hypertension 10/25/2015   Multiple sclerosis 10/02/2015   Chronic  left shoulder pain 07/19/2015   Multiple sclerosis exacerbation 07/14/2015   MS (multiple sclerosis) 11/26/2014   Increased body mass index 11/26/2014   HPV test positive 11/26/2014   Status post laparoscopic supracervical hysterectomy 11/26/2014   Galactorrhea 11/26/2014   Back ache 05/21/2014   Adiposity 05/21/2014   Disordered sleep 05/21/2014   Muscle spasticity 05/21/2014   Spasticity 05/21/2014   Calculus of kidney 12/09/2013   Renal colic 12/09/2013   Hypercholesteremia 08/19/2013   Hereditary and idiopathic neuropathy 08/19/2013   Hypercholesterolemia without hypertriglyceridemia 08/19/2013   Bladder infection, chronic 07/25/2012   Disorder of bladder function 07/25/2012   Incomplete bladder emptying 07/25/2012   Microscopic hematuria 07/25/2012   Right upper quadrant pain 07/25/2012   ONSET DATE: 5/24 Amon and broke R leg which caused beginning of ADL decline; diagnosed with MS in 1995)  REFERRING DIAG: MS  THERAPY DIAG:  Muscle weakness (generalized)  Other lack of coordination  Multiple sclerosis exacerbation  Rationale for Evaluation and Treatment: Rehabilitation  SUBJECTIVE:  SUBJECTIVE STATEMENT: Pt reports doing well today. Pt accompanied by: self  PERTINENT HISTORY: Pt reports increased difficulty with basic self care tasks since breaking her R leg last May.  Since that time, mobility and ADLs have declined, with pt requiring assist from private caregivers and family to manage bathing, toileting, dressing, and functional transfers.  PRECAUTIONS: Fall  WEIGHT BEARING RESTRICTIONS: No  PAIN: 03/20/24: No pain today  Are you having pain? No; occasional pain in R lower back, but not today  FALLS: Has patient fallen in last 6 months? No Last fall was May 17 of 2024  LIVING ENVIRONMENT: Lives with: lives with their family (including mother who has dementia and sister Lisa Williams)  Lives in: 2 level home but pt resides on main level Stairs: ramp  Has  following equipment at home: Wheelchair (manual), Shower bench, and hand held shower shower, 1 grab bar in the shower, 3in1 commode, sit to stand lift (electric), FWW, hospital bed  PLOF: modified indep with ADL/IADLs prior to May of 7975   PATIENT GOALS: Increase independence with basic self care tasks  OBJECTIVE:  Note: Objective measures were completed at Evaluation unless otherwise noted.  HAND DOMINANCE: Right  ADLs:  Overall ADLs: assist provided from paid caregiver and sister Transfers/ambulation related to ADLs: sit to stand lift for all transfers, set up for sliding board in/out of bed  Eating: indep  Grooming: modified indep (wc level in mom's bathroom)  UB Dressing: set up (can't access her bedroom closet)  LB Dressing: able to sit on side of bed to don all LB clothing; assist to hike pants/underwear Toileting: assist with clothing management d/t standing within electric lift Bathing: bed bath with sister helping to wash backside Tub Shower transfers: N/A; pt has walk in shower in mom's bathroom (wc does fit in this bathroom but pt reports inability  to transfer from wc and requires arm rests on a bench for a successful transfer attempt from any DME).  Tub bench is in pt's bathroom, but lift does not fit through bathroom doorway and pt reports inability to transfer up from tub bench (currently unable to manage either transfer)   Equipment: see above  IADLs: Shopping: [employer] for shopping Light housekeeping: modified indep from wc level Meal Prep: modified indep from wc level Community mobility: relies on community transportation or family members Medication management: indep Landscape architect: indep Handwriting: NT; pt denies any FMC challenges  MOBILITY STATUS: Hx of falls  POSTURE COMMENTS:  Rounded shoulders, forward head, anterior pelvic tilt, and weight shift left   ACTIVITY TOLERANCE: Eval: Activity tolerance: Per PT; currently standing 30-60 sec within  Light Gait harness/lift; currently non-ambulatory  01/24/24: Standing at sink: 1 minute; min A and 5 attempts to achieve fully erect standing position with BUEs supported on sink countertop, OT assist to block R knee to prevent buckling   02/28/24: x4 attempts, achieves 75% upright standing with MIN A to block knees.   UPPER EXTREMITY ROM:  BUEs WFL  UPPER EXTREMITY MMT:     MMT Right eval Left eval  Shoulder flexion 4+ 4  Shoulder abduction 4+ 4  Shoulder adduction    Shoulder extension    Shoulder internal rotation 4+ 4+  Shoulder external rotation 4 4  Middle trapezius    Lower trapezius    Elbow flexion 4+ 4+  Elbow extension 4+ 4+  Wrist flexion 4+ 4+  Wrist extension 4+ 4+  Wrist ulnar deviation    Wrist radial deviation    Wrist pronation    Wrist supination    (Blank rows = not tested)  HAND FUNCTION/COORDINATION:  R/L WNL; pt denies any coordination deficits/ able to manipulate pills/clothing fasteners/etc without difficulty  11/15/23: *Measures taken d/t PT note revealing pt with increased difficulty manipulating smaller ADL supplies during reaching activities in PT session   11/15/23: Grip strength: R: 70 lbs, L: 63 lbs Pinch strength: Lateral: R: 16 lbs, L: 15 lbs; 3 point pinch: R: 12 lbs, L: 13 lbs 9 hole peg test: Right: 25 sec, L 49 sec   12/25/23: Grip strength: Right: 70 lbs; Left: 65 lbs  9 hole peg test: Right: 24 sec; Left: 3 trials: 54 sec, 1 min, 56 sec  01/24/24: Grip grip: Right: 76 lbs; Left: 67 lbs Lateral pinch: R: 17 lbs, L: 17 lbs; 3 point pinch: R: 13 lbs, L: 14 lbs  9 Hole Peg Test: R: 26 sec, L: 50 sec  02/28/24: Grip grip: Right: 85 lbs; Left: 71 lbs Lateral pinch: R: 20 lbs, L: 19 lbs; 3 point pinch: R: 17 lbs, L: 18 lbs  9 Hole Peg Test: R: 29 sec, L: 40 sec  SENSATION: WFL  EDEMA: No visible edema in BUEs  MUSCLE TONE: BUEs WNL  COGNITION: Overall cognitive status: Within functional limits for tasks assessed  VISION: wears  glasses all the time, no reports of diplopia  PERCEPTION: Not tested  PRAXIS: WFL  OBSERVATIONS:  Pt pleasant, cooperative, and motivated to work towards improving indep with ADLs.  TREATMENT DATE: 03/20/24 Self Care: -STS trials at sink countertop with RW; min A to block R knee and mod vc for forward WS.  Pt was able to achieve full standing position with RW and min A on 1/10 trials today for 1 min 30 sec, with brief trial to progress to dynamic standing with pt removing L hand from walker to reach for sink countertop.  Uncontrolled descent to wc immediately following reaching attempt.  Able to achieve 50-75% of erect standing position at least 5 of 10 trials.  Therapeutic Activity: -Facilitated core strengthening with ball toss/catch without support of wc arm rests and pt reaching minimally outside BOS laterally R/L to catch ball.  PATIENT EDUCATION: Education details: Company Secretary Person educated: Patient Education method: Explanation, Actor cues, and Verbal cues Education comprehension: verbalized understanding and returned demonstration  HOME EXERCISE PROGRAM: IR A/AAROM to promote shoulder flexibility for peri care/bathing  GOALS: Goals reviewed with patient? Yes  SHORT TERM GOALS: Target date: 12/25/23  Pt will perform bed bath with set up only. Baseline: Eval: Min-mod A for posterior washing; 09/28/23: Min A for posterior washing; 11/13/23: Able to manage bed bath but sister helps with thoroughness, per pt's preference; pt in agreement to trial bath sitting EOB or in wc before next OT session. Goal status: achieved/d/c  2.  Pt will utilize sliding board for wc<>drop arm commode transfer with SBA. Baseline: Eval: Currently using electric lift for transfer to [facility]; 09/28/23: Not yet completed at home but transfers to commode have been easier without sliding board d/t pt  implementing a scoot pivot. Goal status: d/c (pt does not prefer sliding board for commode transfer)  3.  Pt will be indep to perform HEP for maintaining BUE strength for ADLs and functional transfers. Baseline: Eval: HEP not yet initiated; 09/28/23: Pt is working on core stability exercises from wc, cane stretches for bilat shoulder IR, and tricep extension via wc pushups; 11/13/23: indep Goal status: achieved  LONG TERM GOALS: Target date: 02/05/24  Pt will perform sink bath with set up A. (Revised on 11/13/23 from min A to set up) Baseline: Eval: Currently bed bathing d/t inability to stand at sink without electric lift (lift does not fit into pt's bathroom); 09/28/23: still completing bed bath as pt is not yet able to stand; 11/13/23: Not yet attempted; pt encouraged to attempt before next OT session now that bed bath goal has been met; 12/20/23: Pt is now completing sponge bath with min A while seated EOB (caregiver assists with washing peri area d/t no bed rail to aid in R lateral lean); 01/24/24: Performing with set up on EOB, completing lateral weight shifts to wash posterior/peri area with wc in front of pt and bed rail for LUE support and foot of bed for RUE support  Goal status: achieved  2.  Pt will perform wc<>tub bench transfer with min A. Baseline: Eval: Unable; 09/28/23: Performed in OT clinic with min A, but not yet in the home Goal status: d/c (pt prefers sink bath)  3.  Pt will perform squat pivot transfer wc<>BSC with SBA.  Baseline: Eval: Eval: Currently using electric lift for transfer to [facility]; 09/28/23: Not yet completed in clinic with [facility], and using lift for [facility] in the home still; 11/13/23: Still using Lisa Williams lift at home and in clinic transfers are all scoot rather than squat pivots d/t LE weakness; 12/20/23: Pt reports that she plans to use her Lisa Williams lift until she can ambulate to her [facility] d/t  limited space for wc in her desired location for [facility] at home. Goal status: d/c  4. Pt will  perform scoot transfer wc<>toilet using grab bars with supv to allow toileting in community setting.  Baseline: Eval: Pt limits community outings d/t requiring use of lift for [facility] transfers; 09/28/23: Pt has performed 2 successful scoot pivot  Transfers to toilet in OT clinic with min A, extra time.  Further trials with clothing management on toilet needed, but pt can lower and hike  pants while sititng edge of mat via lateral leaning and min A to maintain core stability; 11/13/23: Pt performs with CGA and extra time; 12/19/23: Pt  performs with close supv on most attempts, occasional CGA; efficiency is improving; 01/24/24: ongoing improvements; able to complete in fewer scoots (3 scoots each way from wc<>toilet) d/t improve engagement of Les and improved anterior weight shift; occasional min guard-supv; pt completes with therapist in clinic, but not yet in other community settings with sister, and not yet voided on toilet in community. 02/28/24: has not trialed in community, completes with SBA    Goal status: ongoing  5.  Pt will complete seated clothing management with min A to enable voiding in handicapped stall within community. Baseline: Recert 11/13/23: Pt can transfer to toilet with increased time and effort, but has not yet attempted clothing management or voiding while seated on commode in community setting.  Pt can lower pants to knees while seated in wc, but requires mod A to hike over hips from seated position; 12/20/23: Pt fully lowers panties and shorts while seated on commode with supv, can hike clothing to her her upper thighs, transfers back to wc, and then performs a tricep extension from wc level to allow caregiver to hike clothing remainder of the way for purposes of EC (min A for clothing management component) Goal status: achieved  6.  Pt will tolerate standing x1 min with close supv and BUE support to manage clothing in prep for toileting. Baseline: Eval: Currently standing in electric  lift only, or within parallel bars without lift; 09/28/23: Not yet attempted; 11/13/23: Pt is able to perform static standing with heavy BUE support on parallel bars with PT; 12/19/22: Not attempted recently d/t new intermittent clonus in BLEs; 01/24/24: Tolerated standing 1 min today at sink with min A for ascent, OT assist to block R knee from buckling. 02/28/24: tolerates up to 1 min standing, difficulty achieving stand 2/2 fatigue from PT.  Goal status: in progress  7.  Pt will increase bilat grip strength in order to securely grasp walker sufficiently for functional transfers and ambulation.  Baseline: Recert 11/13/23: TBD (PT note read following OT session, indicating pt with difficulty securing items in hand);  11/15/23: Grip strength: R: 70 lbs, L: 63 lbs; 12/25/23: R 76 lbs, L 67 lbs; sufficient for functional transfers/amb  Goal status: achieved  8.  Pt will increase [facility] skills in order to manipulate small ADL supplies with reduced dropping as indicated by 20 sec or more improvement in 9 hole peg test. Baseline: Recert 11/13/23: 9 hole peg test TBD (PT note read following OT session, indicating pt with difficulty manipulating and securing items in hands); 11/15/23: Right: 25 sec, L 49 sec; 12/25/23: Right: 24 sec; Left: 3 trials: 54 sec, 1 min, 56 sec; 01/24/24: R: 26 sec, L: 50 sec. 02/28/24: L: 40 sec.  Goal status: in progress  ASSESSMENT: CLINICAL IMPRESSION: Pt was able to achieve full standing position with RW and min  A on 1/10 trials today. 1 min 30 sec standing tolerance at sink countertop, with brief trial to progress to dynamic standing with pt removing L hand from walker to reach for sink countertop.  Uncontrolled descent to wc immediately following reaching attempt.  Able to achieve 50-75% of erect standing position at least 5 of 10 trials. Continued focus on core stability for improving stability with ADLs and functional mobility; good tolerance to activities noted above.  Pt will continue  to benefit from skilled OT to work towards above noted goals in OT poc, working to maximize indep with daily tasks while reducing burden of care on caregivers.   PERFORMANCE DEFICITS: in functional skills including ADLs, IADLs, strength, pain, flexibility, Gross motor control, mobility, balance, body mechanics, endurance, and decreased knowledge of use of DME, and psychosocial skills including coping strategies, environmental adaptation, habits, and routines and behaviors.   IMPAIRMENTS: are limiting patient from ADLs, IADLs, and social participation.   CO-MORBIDITIES: has co-morbidities such as neuropathy, back pain, obesity, MS, HTN that affects occupational performance. Patient will benefit from skilled OT to address above impairments and improve overall function.  MODIFICATION OR ASSISTANCE TO COMPLETE EVALUATION: No modification of tasks or assist necessary to complete an evaluation.  OT OCCUPATIONAL PROFILE AND HISTORY: Detailed assessment: Review of records and additional review of physical, cognitive, psychosocial history related to current functional performance.  CLINICAL DECISION MAKING: Moderate - several treatment options, min-mod task modification necessary  REHAB POTENTIAL: Good  EVALUATION COMPLEXITY: Moderate  PLAN:  OT FREQUENCY: 2x/week  OT DURATION: 12 weeks  PLANNED INTERVENTIONS: 97168 OT Re-evaluation, 97535 self care/ADL training, 02889 therapeutic exercise, 97530 therapeutic activity, 97112 neuromuscular re-education, 97140 manual therapy, 97116 gait training, 02989 moist heat, 97010 cryotherapy, balance training, functional mobility training, psychosocial skills training, energy conservation, coping strategies training, patient/family education, and DME and/or AE instructions  RECOMMENDED OTHER SERVICES: None at this time (Pt currently receiving PT services in this clinic)  CONSULTED AND AGREED WITH PLAN OF CARE: Patient  PLAN FOR NEXT SESSION: see  above  Inocente Blazing, MS, OTR/L   Inocente MARLA Blazing, OT 03/23/2024, 1:25 PM

## 2024-03-25 ENCOUNTER — Ambulatory Visit: Payer: PPO

## 2024-03-25 ENCOUNTER — Ambulatory Visit

## 2024-03-25 DIAGNOSIS — R278 Other lack of coordination: Secondary | ICD-10-CM

## 2024-03-25 DIAGNOSIS — M6281 Muscle weakness (generalized): Secondary | ICD-10-CM

## 2024-03-25 DIAGNOSIS — G35D Multiple sclerosis, unspecified: Secondary | ICD-10-CM

## 2024-03-25 DIAGNOSIS — R2681 Unsteadiness on feet: Secondary | ICD-10-CM

## 2024-03-25 DIAGNOSIS — R262 Difficulty in walking, not elsewhere classified: Secondary | ICD-10-CM

## 2024-03-25 DIAGNOSIS — R2689 Other abnormalities of gait and mobility: Secondary | ICD-10-CM

## 2024-03-25 DIAGNOSIS — R269 Unspecified abnormalities of gait and mobility: Secondary | ICD-10-CM

## 2024-03-25 NOTE — Therapy (Signed)
 OUTPATIENT PHYSICAL THERAPY NEURO TREATMENT   Patient Name: Lisa Williams MRN: [id number] DOB:13-Dec-1960, 63 y.o., female Today's Date: 03/25/2024  PCP: Lenon Na  REFERRING PROVIDER: Lenon Na   END OF SESSION:  PT End of Session - 03/25/24 1029     Visit Number 71    Number of Visits 94    Date for Recertification  06/12/24   corrected   Progress Note Due on Visit 50    PT Start Time 1018    PT Stop Time 1100    PT Time Calculation (min) 42 min    Equipment Utilized During Treatment Gait belt    Activity Tolerance Patient tolerated treatment well;No increased pain    Behavior During Therapy [facility] for tasks assessed/performed          Past Medical History:  Diagnosis Date   Abdominal pain, right upper quadrant    Back pain    Calculus of kidney 12/09/2013   Chronic back pain    unspecified   Chronic left shoulder pain 07/19/2015   Complication of anesthesia    Functional disorder of bladder    other   Galactorrhea 11/26/2014   Chronic    Hereditary and idiopathic neuropathy 08/19/2013   High cholesterol    History of kidney stones    HPV test positive    Hypercholesteremia 08/19/2013   Hypertension    Hypertension    Hypothyroidism    Incomplete bladder emptying    Microscopic hematuria    MS (multiple sclerosis)    Muscle spasticity 05/21/2014   Nonspecific findings on examination of urine    other   Osteopenia    PONV (postoperative nausea and vomiting)    Prediabetes    Sickle cell trait    Status post laparoscopic supracervical hysterectomy 11/26/2014   Tobacco user 11/26/2014   Wrist fracture    Past Surgical History:  Procedure Laterality Date   bilateral tubal ligation  1996   BREAST CYST EXCISION Left 2002   CYST EXCISION Left 05/10/2022   Procedure: CYST REMOVAL;  Surgeon: Rodolph Romano, MD;  Location: [facility] ORS;  Service: General;  Laterality: Left;   FRACTURE SURGERY     KNEE SURGERY     right    LAPAROSCOPIC SUPRACERVICAL HYSTERECTOMY  08/05/2013   ORIF WRIST FRACTURE Left 01/17/2017   Procedure: OPEN REDUCTION INTERNAL FIXATION (ORIF) WRIST FRACTURE;  Surgeon: Leora Lynwood SAUNDERS, MD;  Location: [facility] OR[facility];  Service: Orthopedics;  Laterality: Left;   RADIOLOGY WITH ANESTHESIA N/A 03/18/2020   Procedure: MRI WITH ANESTHESIA CERVICAL SPINE AND BRAIN  WITH AND WITHOUT CONTRAST;  Surgeon: Radiologist, Medication, MD;  Location: [facility] OR;  Service: Radiology;  Laterality: N/A;   right tibia fracture Right    2024   TUBAL LIGATION Bilateral    VAGINAL HYSTERECTOMY  03/2006   VAGINAL HYSTERECTOMY     2014   Patient Active Problem List   Diagnosis Date Noted   SOB (shortness of breath) on exertion 03/12/2024   Hyperglycemia 01/09/2024   Abnormal LFTs (liver function tests) 09/17/2023   Sinus tachycardia 07/07/2023   Aortic atherosclerosis 01/18/2023   Prediabetes 01/18/2023   Hypokalemia 07/29/2022   Weakness of both lower extremities 07/29/2022   Abscess of left groin 11/15/2021   Abnormal LFTs 11/15/2021   Acute respiratory disease due to COVID-19 virus 11/21/2020   Fatigue due to excessive exertion    Hypoalbuminemia due to protein-calorie malnutrition    Neurogenic bowel    Neurogenic bladder    Labile  blood pressure    Neuropathic pain    Multiple sclerosis, relapsing-remitting 07/09/2019   Abscess of female pelvis    SVT (supraventricular tachycardia)    Radial styloid tenosynovitis 03/12/2018   Wheelchair dependence 02/27/2018   Localized osteoporosis with current pathological fracture with routine healing 01/19/2017   Wrist fracture 01/16/2017   Sprain of ankle 03/23/2016   Closed fracture of lateral malleolus 03/16/2016   Depression screening 01/24/2016   Blood pressure elevated without history of HTN 10/25/2015   Essential hypertension 10/25/2015   Multiple sclerosis 10/02/2015   Chronic left shoulder pain 07/19/2015   Multiple sclerosis exacerbation 07/14/2015    MS (multiple sclerosis) 11/26/2014   Increased body mass index 11/26/2014   HPV test positive 11/26/2014   Status post laparoscopic supracervical hysterectomy 11/26/2014   Galactorrhea 11/26/2014   Back ache 05/21/2014   Adiposity 05/21/2014   Disordered sleep 05/21/2014   Muscle spasticity 05/21/2014   Spasticity 05/21/2014   Calculus of kidney 12/09/2013   Renal colic 12/09/2013   Hypercholesteremia 08/19/2013   Hereditary and idiopathic neuropathy 08/19/2013   Hypercholesterolemia without hypertriglyceridemia 08/19/2013   Bladder infection, chronic 07/25/2012   Disorder of bladder function 07/25/2012   Incomplete bladder emptying 07/25/2012   Microscopic hematuria 07/25/2012   Right upper quadrant pain 07/25/2012    ONSET DATE: 1995  REFERRING DIAG: MS  THERAPY DIAG:  Muscle weakness (generalized)  Other lack of coordination  Multiple sclerosis exacerbation  Difficulty in walking, not elsewhere classified  Unsteadiness on feet  Abnormality of gait and mobility  Other abnormalities of gait and mobility  Rationale for Evaluation and Treatment: Rehabilitation  SUBJECTIVE:                                                                                                                                                                                             SUBJECTIVE STATEMENT:  Pt reports she is doing well and was able to cook over the weekend.  Pt accompanied by: self  PERTINENT HISTORY:  Patient is returning to PT s/p hospitalization.  s/p ORIF of R tibia shaft fracture 09/23/2022. Patient has weakness in BLE with RLE>LLE. She drives with hand controls. Patient has been diagnosed with MS in 1995. PMH includes: back pain, CBP, chronic L shoulder pain, galactorrhea, neuropathy, HPV, hypercholestermeia, HTN, MS, osteopenia, PONV, wrist fracture. Additional order for other closed fracture of proximal end of R tibia with routine healing. Still has to use [employer].    PAIN:  Are you having pain? Occasional pain in RLE; primarily in knee  PRECAUTIONS: Fall  RED FLAGS: None   WEIGHT BEARING RESTRICTIONS: No  FALLS: Has patient fallen in last 6 months? No  LIVING ENVIRONMENT: Lives with: lives with their family Lives in: House/apartment Stairs: ramp Has following equipment at home: Vannie - 2 wheeled, Wheelchair (manual), shower chair, and Grab bars  PLOF: Independent with household mobility with device  PATIENT GOALS: to get her independence back. To be able to get into car, toilet, and get dressed independently  OBJECTIVE:  Note: Objective measures were completed at Evaluation unless otherwise noted.  DIAGNOSTIC FINDINGS: MRI of the brain 03/18/2020 showed multiple T2/FLAIR hyperintense foci in the periventricular, juxtacortical and deep white matter.  There were no infratentorial lesions noted.  None of the foci enhanced.   Due to severe claustrophobia, the study was done with conscious sedation in the hospital   COGNITION: Overall cognitive status: Within functional limits for tasks assessed   SENSATION: Lack of sensation in feet, loss in bilateral lateral aspect of knee  COORDINATION: Does not have the strength for functional LE heel slide test   MUSCLE TONE: BLE mild tone    POSTURE: rounded shoulders, forward head, anterior pelvic tilt, and weight shift left   LOWER EXTREMITY MMT:    MMT Right Eval Left Eval  Hip flexion 0.9 1.5  Hip extension    Hip abduction 1.8 2.6  Hip adduction 3.1 1.9  Hip internal rotation    Hip external rotation    Knee flexion 0.9 1.5  Knee extension 0.8 1.2  Ankle dorsiflexion    Ankle plantarflexion    Ankle inversion    Ankle eversion    (Blank rows = not tested)  BED MOBILITY:  Assess in future session due to limited time  TRANSFERS: Assistive device utilized: Bariatric RW with sit to stands, slide board to table  Sit to stand: unable to reach full stand Stand to sit: unable  to reach full stand  Chair to chair: slide board with CGA   GAIT: Unable to ambulate at this time.   FUNCTIONAL TESTS:  Sit to stand: tricep press with BUE; unable to bring arm to walker.    Function In Sitting Test (FIST)  (1/2 femur on surface; hips/knees flexed to 90deg)   - indicate bed or mat table / step stool if used  SCORING KEY: 4 = Independent (completes task independently & successfully) 3 = Verbal Cues/Increased Time (completes task independently & successfully and only needs more time/cues) 2 = Upper Extremity Support (must use UE for support or assistance to complete successfully) 1 = Needs Assistance (unable to complete w/o physical assist; DOCUMENT LEVEL: min, mod, max) 0 = Dependent (requires complete physical assist; unable to complete successfully even w/ physical assist)  Randomly Administer Once Throughout Exam  4 - Anterior Nudge (superior sternum)  4 - Posterior Nudge (between scapular spines)  4 - Lateral Nudge (to dominant side at acromion)     4 - Static sitting (30 seconds)  4 - Sitting, shake 'no' (left and right)  4 - Sitting, eyes closed (30 seconds)   0 - Sitting, lift foot (dominant side, lift foot 1 inch twice)    2 - Pick up object from behind (object at midline, hands breadth posterior)  3 - Forward reach (use dominant arm, must complete full motion) 2 - Lateral reach (use dominant arm, clear opposite ischial tuberosity) 2 - Pick up object from floor (from between feet)   2 - Posterior scooting (move backwards 2 inches)  2 - Anterior scooting (move forward 2 inches)  2 - Lateral scooting (move  to dominant side 2 inches)    TOTAL = 39/56  Notes/comments: slide board tranfer to/from table   MCD > 5 points MCID for IP REHAB > 6 points  Function In Sitting Test (FIST) 08/14/23 (1/2 femur on surface; hips/knees flexed to 90deg)   - indicate bed or mat table / step stool if used  SCORING KEY: 4 = Independent (completes task independently  & successfully) 3 = Verbal Cues/Increased Time (completes task independently & successfully and only needs more time/cues) 2 = Upper Extremity Support (must use UE for support or assistance to complete successfully) 1 = Needs Assistance (unable to complete w/o physical assist; DOCUMENT LEVEL: min, mod, max) 0 = Dependent (requires complete physical assist; unable to complete successfully even w/ physical assist)  Randomly Administer Once Throughout Exam  4 - Anterior Nudge (superior sternum)  4 - Posterior Nudge (between scapular spines)  4 - Lateral Nudge (to dominant side at acromion)     4 - Static sitting (30 seconds)  4 - Sitting, shake 'no' (left and right)  4 - Sitting, eyes closed (30 seconds)   0 - Sitting, lift foot (dominant side, lift foot 1 inch twice)    4 - Pick up object from behind (object at midline, hands breadth posterior)  4 - Forward reach (use dominant arm, must complete full motion) 4 - Lateral reach (use dominant arm, clear opposite ischial tuberosity)  - Pick up object from floor (from between feet)   3 - Posterior scooting (move backwards 2 inches)  3 - Anterior scooting (move forward 2 inches)  3 - Lateral scooting (move to dominant side 2 inches)    TOTAL = 47/56  MCD > 5 points MCID for IP REHAB > 6 points   TREATMENT DATE: 03/25/24   Gait:  Pt ambulated for a total of 3 attempts, 22' 2 on first attempt, 64' 1 on the second attempt, 59' 10 on the third attempt (60' 1 in total).  Pt utilized the bariatric FWW, pillow under the thighs for easier ability to come upright, and sock applied to the L foot to reduce friction of the foot in mobility.   Rest breaks given and pt's symptoms monitored throughout the session.   TherEx:  Seated dumbbell curls, 3#, 2x10 each UE Seated chest press, 3# DB's, 2x10 each  UE Seated horizontal abduction, 3# DB's, 2x10 each UE    PATIENT EDUCATION: Education details: Pt educated throughout session about  proper posture and technique with exercises. Improved exercise technique, movement at target joints, use of target muscles after min to mod verbal, visual, tactile cues.  Person educated: Patient Education method: Explanation, Demonstration, Tactile cues, and Verbal cues Education comprehension: verbalized understanding, returned demonstration, verbal cues required, tactile cues required, and needs further education  HOME EXERCISE PROGRAM: Stand 3x/day in stander   GOALS: Goals reviewed with patient? Yes  SHORT TERM GOALS: Target date: 08/08/2023  Patient will be independent in home exercise program to improve strength/mobility for better functional independence with ADLs.  Baseline: 4/8: compliance Goal status: MET    LONG TERM GOALS: Target date: 03/20/2024  Patient will tolerate five consecutive stands with UE support from wheelchair to standing to improve functional mobility with additional cushion and RW.  Baseline: unable to perform 4/8:unable to perform full stand 5/20: perform next session 5/29: two full stands with CGA and cushion on her seat 7/31: able to stand 3 times from [facility]. With no addition cushion  in parallel bars. 12/27/2023= Will retest  next session- concentrated on just standing on past 2 visits 03/20/24:  Goal status: Ongoing   2.  Patient will ambulate 10 ft with bRW with wheelchair follow.  Baseline:  unable to ambulate 4/8: unable to ambulate 5/20: able to ambulate length of // bars in previous session 5/27: ambulate 2.5 lengths of // bars with two seated rest breaks with min A for weight shift and wheelchair follow  7/31: able to ambulate in parallel bars 3 ft with min assist and +2 follow in WC> difficulty advancing the LLE on this day. 12/27/2023- Patient unable to take a step forward today but was abel to take a step on previous visit- limited by clonus like trembling that has progressed over past month. 03/20/24: 26' at one time Goal status: MET   3.  Patient  will improve FIST score >6 points to demonstrate improved stability and ability to perform ADLs.  Baseline:  3/6: 39/56 4/8: 47/ 56 5/20: 48/56 7/31:46/56 Goal status: MET  4.  Patient will perform toileting with mod I at home for improved independence.  Baseline: 3/6: requires assistance 4/8: requires dependence on machine 5/20: requires assistance with use of wipe buddy.  7/31: continues to require use of sara steady and wipe buddy for safety and hygiene. 12/27/2023- Patient reports that since she is unable to take steps that she continues to require use of sara steady and wipe buddy for safe personal hygiene. 03/20/24: Pt utilizing sara stedy to perform safe personal hygiene, however is no longer utilizing the wipe buddy. Goal status: PROGRESSING   5. Patient will perform > 5 min of static or dynamic standing with BUE and Min/mod assist  for improved LE strength and pre-gait function to assist with toileting and overall standing ability for improve functional independence.  Baseline: 12/27/2023-Patient was able to stand for just over 3 min today. 01/10/24: Pt able to stand 1 min 38 sec 02/14/24: Pt able to >3 minutes at this time. 03/20/24: Pt able to achieve >4 minutes at this time. Goal status: PROGRESSING  6.  Patient will ambulate 40 ft with BRW with wheelchair follow, in one attempt, without rest break. Baseline: 87' during one attempt. Goal status: NEW   ASSESSMENT:  CLINICAL IMPRESSION:  Pt responded well to the exercises and was able to achieve the longest distance ambulated since starting therapy this year.  Pt is making significant progress towards ability to ambulate and may attempt to perform the ambulation attempt next visit with use of platform walker and subsequent visits in order to engage larger muscles.  Pt agreeable to attempt over the next few visits.   Pt will continue to benefit from skilled therapy to address remaining deficits in order to improve overall QoL and return  to PLOF.          OBJECTIVE IMPAIRMENTS: Abnormal gait, cardiopulmonary status limiting activity, decreased activity tolerance, decreased balance, decreased coordination, decreased endurance, decreased mobility, difficulty walking, decreased ROM, decreased strength, hypomobility, increased fascial restrictions, impaired perceived functional ability, impaired flexibility, impaired sensation, improper body mechanics, and postural dysfunction.   ACTIVITY LIMITATIONS: carrying, lifting, bending, sitting, standing, squatting, sleeping, stairs, transfers, bed mobility, continence, bathing, toileting, dressing, self feeding, reach over head, hygiene/grooming, locomotion level, and caring for others  PARTICIPATION LIMITATIONS: meal prep, cleaning, laundry, interpersonal relationship, driving, shopping, community activity, and church  PERSONAL FACTORS: Age, Past/current experiences, Time since onset of injury/illness/exacerbation, Transportation, and 3+ comorbidities: ack pain, CBP, chronic L shoulder pain, galactorrhea, neuropathy, HPV, hypercholestermeia, HTN, MS, osteopenia, PONV,  wrist fracture are also affecting patient's functional outcome.   REHAB POTENTIAL: Good  CLINICAL DECISION MAKING: Evolving/moderate complexity  EVALUATION COMPLEXITY: Moderate  PLAN:  PT FREQUENCY: 2x/week  PT DURATION: 12 weeks  PLANNED INTERVENTIONS: 97164- PT Re-evaluation, 97110-Therapeutic exercises, 97530- Therapeutic activity, 97112- Neuromuscular re-education, 97535- Self Care, [id number]- Manual therapy, [phone]- Gait training, [phone]- Orthotic Fit/training, [phone]- Canalith repositioning, [id number]- Aquatic Therapy, [phone]- Electrical stimulation (unattended), [phone]- Electrical stimulation (manual), [id number]- Ultrasound, [id number]- Traction (mechanical), Patient/Family education, Balance training, Stair training, Taping, Dry Needling, Joint mobilization, Joint manipulation, Spinal manipulation, Spinal mobilization, Scar  mobilization, Compression bandaging, Vestibular training, Visual/preceptual remediation/compensation, Cognitive remediation, DME instructions, Cryotherapy, Moist heat, and Biofeedback  PLAN FOR NEXT SESSION:  Strap stand frame with more dynamic activities Sit to stands; try supported walking, continue dynamic core stabilization training and seated reaching tasks to improve weight shifting.    Fonda Simpers, PT, DPT Physical Therapist - [facility]  03/25/24, 10:29 AM

## 2024-03-26 ENCOUNTER — Ambulatory Visit (INDEPENDENT_AMBULATORY_CARE_PROVIDER_SITE_OTHER): Payer: Self-pay | Admitting: Nurse Practitioner

## 2024-03-26 ENCOUNTER — Encounter (INDEPENDENT_AMBULATORY_CARE_PROVIDER_SITE_OTHER): Payer: Self-pay | Admitting: Nurse Practitioner

## 2024-03-26 VITALS — BP 126/74 | HR 64 | Temp 98.0°F | Ht 71.0 in | Wt 242.0 lb

## 2024-03-26 DIAGNOSIS — Z6833 Body mass index (BMI) 33.0-33.9, adult: Secondary | ICD-10-CM | POA: Diagnosis not present

## 2024-03-26 DIAGNOSIS — E039 Hypothyroidism, unspecified: Secondary | ICD-10-CM

## 2024-03-26 DIAGNOSIS — R7989 Other specified abnormal findings of blood chemistry: Secondary | ICD-10-CM

## 2024-03-26 DIAGNOSIS — E7800 Pure hypercholesterolemia, unspecified: Secondary | ICD-10-CM

## 2024-03-26 DIAGNOSIS — E66811 Obesity, class 1: Secondary | ICD-10-CM | POA: Diagnosis not present

## 2024-03-26 DIAGNOSIS — R7303 Prediabetes: Secondary | ICD-10-CM | POA: Diagnosis not present

## 2024-03-26 DIAGNOSIS — I10 Essential (primary) hypertension: Secondary | ICD-10-CM

## 2024-03-26 DIAGNOSIS — G35D Multiple sclerosis, unspecified: Secondary | ICD-10-CM

## 2024-03-26 NOTE — Progress Notes (Signed)
 Office: [phone]  /  Fax: [phone]  WEIGHT SUMMARY AND BIOMETRICS  Weight Lost Since Last Visit: 0  Weight Gained Since Last Visit: 6 lb   Vitals Temp: 98 F (36.7 C) BP: 126/74 Pulse Rate: 64 SpO2: 100 %   Anthropometric Measurements Height: 5' 11 (1.803 m) Weight: 242 lb (109.8 kg) BMI (Calculated): 33.77 Weight at Last Visit: 236 lb Weight Lost Since Last Visit: 0 Weight Gained Since Last Visit: 6 lb Starting Weight: 236 lb Total Weight Loss (lbs): 0 lb (0 kg)   No data recorded Other Clinical Data Fasting: no Labs: no Today's Visit #: 2 Starting Date: 03/12/24    Total Weight Loss: Was not calculated right at last visit by subtracting wheelchair weight which is 21.5 kg   HPI  Chief Complaint: OBESITY  Sheneika is here to discuss her progress with her obesity treatment plan. She is on the the Category 1 Plan and states she is following her eating plan approximately 100 % of the time. She states she is exercising  at PT and focusing on walking and OT is working on standing.    Interval History:  Since last office visit she has been not skipping any meals if not hungry will do a protein shake.  Getting 100 grams of protein a day Breakfast:2 eggs, low calorie toast and fruit or yogurt. Lunch salad with 4 ounces of protein or protein shake Dinner: chicken and salad with corn on the cob.  She is drinking about 60 ounces of water daily  Sidnee continues to experience fatigue, which she attributes to both her MS and reduced mobility.  Kyrra has a history of multiple sclerosis diagnosed in 1995, which has significantly impacted her mobility. She is currently using a wheelchair due to severe limitations in mobility and a recent leg fracture.  She is currently on Mavenclad 10 mg as directed( given 5 days in June and 5 days in July and repeat same dosing next year), Baclofen  20 mg QID and Tizanidine  4 mg 1 in AM, 1 in PM and 2 at bedtime. She is  followed by neurology Dr. Vear. She continues to go to PT and OT to build strength and ROM.       She does have hypertension and BP's are currently well controlled with Telmisartan  40 mg every day and denies chest pain, shortness of breath at rest and blurred vision.  BP Readings from Last 3 Encounters:  03/26/24 126/74  03/12/24 137/73  02/18/24 (!) 177/78   Last TSH was not in range and Levothyroxine  was increased from 50 to 75 mcg daily on 02/27/24 denies any side effects from dose increase. She is to have rechecked first Tuesday of December. She does have a history of elevated LDL of 132 from previous lipid panel 07/17/2023- currently on Rosuvastatin 10 mg every day without side effects  Lab results are reviewed with patient in detail. Normal electrolytes, fasting glucose, kidney functions, fasting insulin , Vit D, Vit B12 and AST/ALT. Elevated alkaline phosphatase but normal RUQ ultrasound done 09/28/23.    Cancer screenings are reviewed with patient as obesity is a risk cancer for certain cancers.   Pap:06/02/15 negative with negative high risk HPV Mammo:05/16/23 Negative Colonoscopy Was due in 2024 but unable to get due to fracture.  ASCVD risk is reviewed with patient as obesity is a risk factor for cardiovascular disease  The 10-year ASCVD risk score (Arnett DK, et al., 2019) is: 7.2%- borderline risk   Values used to calculate the  score:     Age: 57 years     Clincally relevant sex: Female     Is Non-Hispanic African American: Yes     Diabetic: No     Tobacco smoker: No     Systolic Blood Pressure: 137 mmHg     Is BP treated: Yes     HDL Cholesterol: 43.2 mg/dL     Total Cholesterol: 136 mg/dL   PHYSICAL EXAM:  Blood pressure 126/74, pulse 64, temperature 98 F (36.7 C), height 5' 11 (1.803 m), weight 242 lb (109.8 kg), SpO2 100%. Body mass index is 33.75 kg/m.  General: Well Developed, well nourished, and in no acute distress.  HEENT: Normocephalic, atraumatic; EOMI,  sclerae are anicteric. Skin: Warm and dry, good turgor Chest:  Normal excursion, shape, no gross ABN Respiratory: No conversational dyspnea; speaking in full sentences NeuroM-Sk:  Decreased strength and ROM of lower extremities bilaterally, uses wheelchair Psych: A and O X 3, insight adequate, mood- full    DIAGNOSTIC DATA REVIEWED:  Last metabolic panel Lab Results  Component Value Date   GLUCOSE 80 03/12/2024   NA 142 03/12/2024   K 4.5 03/12/2024   CL 106 03/12/2024   CO2 23 03/12/2024   BUN 22 03/12/2024   CREATININE 0.66 03/12/2024   EGFR 99 03/12/2024   CALCIUM  9.6 03/12/2024   PHOS 3.1 07/29/2022   PROT 7.2 03/12/2024   ALBUMIN 4.4 03/12/2024   LABGLOB 2.8 03/12/2024   AGRATIO 2.3 (H) 03/03/2021   BILITOT 0.5 03/12/2024   ALKPHOS 158 (H) 03/12/2024   AST 14 03/12/2024   ALT 12 03/12/2024   ANIONGAP 8 07/08/2023     Lab Results  Component Value Date   HGBA1C 6.1 01/09/2024   HGBA1C 5.1 08/01/2011   Lab Results  Component Value Date   INSULIN  8.0 03/12/2024   Lab Results  Component Value Date   TSH 14.87 (H) 02/25/2024   CBC    Component Value Date/Time   WBC 6.2 01/09/2024 1124   RBC 4.70 01/09/2024 1124   HGB 12.5 01/09/2024 1124   HGB 11.9 12/17/2023 1549   HCT 38.0 01/09/2024 1124   HCT 35.3 12/17/2023 1549   PLT 216.0 01/09/2024 1124   PLT 185 12/17/2023 1549   MCV 80.8 01/09/2024 1124   MCV 85 12/17/2023 1549   MCV 79 (L) 02/15/2013 1700   MCH 28.7 12/17/2023 1549   MCH 26.6 07/08/2023 0620   MCHC 33.0 01/09/2024 1124   RDW 15.3 01/09/2024 1124   RDW 15.3 12/17/2023 1549   RDW 13.6 02/15/2013 1700   Iron Studies    Component Value Date/Time   IRON 71 08/07/2019 0450   TIBC 253 08/07/2019 0450   FERRITIN 36 08/07/2019 0450   IRONPCTSAT 28 08/07/2019 0450   Lipid Panel     Component Value Date/Time   CHOL 136 01/09/2024 1124   CHOL 187 11/29/2016 0000   TRIG 73.0 01/09/2024 1124   HDL 43.20 01/09/2024 1124   HDL 55  11/29/2016 0000   CHOLHDL 3 01/09/2024 1124   VLDL 14.6 01/09/2024 1124   LDLCALC 78 01/09/2024 1124   LDLCALC 119 (H) 11/29/2016 0000   Hepatic Function Panel     Component Value Date/Time   PROT 7.2 03/12/2024 1056   PROT 7.2 02/15/2013 1700   ALBUMIN 4.4 03/12/2024 1056   ALBUMIN 3.6 02/15/2013 1700   AST 14 03/12/2024 1056   AST 20 02/15/2013 1700   ALT 12 03/12/2024 1056   ALT 15 02/15/2013  1700   ALKPHOS 158 (H) 03/12/2024 1056   ALKPHOS 127 02/15/2013 1700   BILITOT 0.5 03/12/2024 1056   BILITOT 0.3 02/15/2013 1700   BILIDIR 0.1 01/09/2024 1124   BILIDIR 0.11 12/17/2023 1549      Component Value Date/Time   TSH 14.87 (H) 02/25/2024 1051   Nutritional Lab Results  Component Value Date   VD25OH 76.9 03/12/2024   VD25OH 33.3 08/01/2021   Lab Results  Component Value Date   VITAMINB12 461 03/12/2024      ASSESSMENT AND PLAN   Class 1 obesity with serious comorbidity and body mass index (BMI) of 33.0 to 33.9 in adult, unspecified obesity type TREATMENT PLAN FOR OBESITY:  Recommended Dietary Goals  Caya is currently in the action stage of change. As such, her goal is to continue weight management plan. She has agreed to the Category 1 Plan.  Behavioral Intervention  We discussed the following Behavioral Modification Strategies today: increasing lean protein intake to established goals, avoiding skipping meals, increasing water intake , continue to practice mindfulness when eating, celebration eating strategies- one plate with protein as largest portion, clean vegetable and then portion of anything she wants to try but food cannot touch on the plate, be mindful and enjoy your food  , continue to work on maintaining a reduced calorie state, getting the recommended amount of protein, incorporating whole foods, making healthy choices, staying well hydrated and practicing mindfulness when eating., and increase protein intake, fibrous foods (25 grams per day for  women, 30 grams for men) and water to improve satiety and decrease hunger signals. .  Additional resources provided today: Recommend use of protein shake as needed to supplement protein but noot more than once a day  Recommended Physical Activity Goals  Shayanne has been advised to work up to 150 minutes of moderate intensity aerobic activity a week and strengthening exercises 2-3 times per week for cardiovascular health, weight loss maintenance and preservation of muscle mass.   She has agreed to Patient will begin to exercise with chair aerobics as tolerated and given [facility] website to try, Think about enjoyable ways to increase daily physical activity and overcoming barriers to exercise, and Increase physical activity in their day and reduce sedentary time (increase NEAT).   Pharmacotherapy We discussed various medication options to help Consuella with her weight loss efforts and we both agreed to continue nutrition and behavior modification.  ASSOCIATED CONDITIONS ADDRESSED TODAY  Action/Plan  Hypercholesteremia  Essential hypertension Continue telmisartan  40 mg  every day Continue Category 1 meal plan  and DASH diet Monitor BP and if consistently >140/90 notify PCP If develops headaches, chest pain, shortness of breath or dizziness go to ER Continue to regularly follow up with PCP for management  Abnormal LFTs- Normal RUQ U/S 09/28/2023 Continue Category 1 meal plan and focus on limiting saturated fats and simple carbohydrates Loss of 10-15% of body weight can help improve hepatic steatosis   Prediabetes Continue Category 1  meal plan, limit simple carbohydrates Decreasing body weight by 10-15% can improve glucose levels Start chair exercises with [facility] on line as tolerated, continue PT and OT  Multiple sclerosis      Continue PT/OT, follow with neurology and continue medications as directed       Monitor symptoms  Hypothyroidism, unspecified type       Continue  Levothyroxine  75 mcg every day and follow up is scheduled 04/21/24 to have rechecked at PCP- may still require dose adjustment  Return in about 4 weeks (around 04/23/2024).[name] She was informed of the importance of frequent follow up visits to maximize her success with intensive lifestyle modifications for her multiple health conditions.   ATTESTASTION STATEMENTS:  Reviewed by clinician on day of visit: allergies, medications, problem list, medical history, surgical history, family history, social history, and previous encounter notes.   I personally spent a total of 50 minutes in the care of the patient today including preparing to see the patient, getting/reviewing separately obtained history, performing a medically appropriate exam/evaluation, counseling and educating, and documenting clinical information in the EHR.   Latash Nouri ANP-C

## 2024-03-27 ENCOUNTER — Ambulatory Visit: Payer: PPO

## 2024-03-27 ENCOUNTER — Ambulatory Visit

## 2024-03-27 DIAGNOSIS — M6281 Muscle weakness (generalized): Secondary | ICD-10-CM

## 2024-03-27 DIAGNOSIS — R262 Difficulty in walking, not elsewhere classified: Secondary | ICD-10-CM

## 2024-03-27 DIAGNOSIS — R269 Unspecified abnormalities of gait and mobility: Secondary | ICD-10-CM

## 2024-03-27 DIAGNOSIS — R278 Other lack of coordination: Secondary | ICD-10-CM

## 2024-03-27 DIAGNOSIS — G35D Multiple sclerosis, unspecified: Secondary | ICD-10-CM

## 2024-03-27 DIAGNOSIS — R2681 Unsteadiness on feet: Secondary | ICD-10-CM

## 2024-03-27 DIAGNOSIS — R2689 Other abnormalities of gait and mobility: Secondary | ICD-10-CM

## 2024-03-27 NOTE — Therapy (Signed)
 OUTPATIENT OCCUPATIONAL THERAPY NEURO TREATMENT NOTE  Patient Name: Lisa Williams MRN: [id number] DOB:22-Dec-1960, 63 y.o., female Today's Date: 03/27/2024  PCP: Dr. Layman Williams   Neurologist Dr. Suanne at [facility] REFERRING PROVIDER: Dr. Layman Williams    END OF SESSION:  OT End of Session - 03/27/24 0741     Visit Number [id number]    Number of Visits 67    Date for Recertification  04/29/24    Authorization Time Period Reporting period beginning 02/28/24    OT Start Time 1100    OT Stop Time 1140    OT Time Calculation (min) 40 min    Equipment Utilized During Treatment manual wc    Activity Tolerance Patient tolerated treatment well    Behavior During Therapy [facility] for tasks assessed/performed         Past Medical History:  Diagnosis Date   Abdominal pain, right upper quadrant    Back pain    Calculus of kidney 12/09/2013   Chronic back pain    unspecified   Chronic left shoulder pain 07/19/2015   Complication of anesthesia    Functional disorder of bladder    other   Galactorrhea 11/26/2014   Chronic    Hereditary and idiopathic neuropathy 08/19/2013   High cholesterol    History of kidney stones    HPV test positive    Hypercholesteremia 08/19/2013   Hypertension    Hypertension    Hypothyroidism    Incomplete bladder emptying    Microscopic hematuria    MS (multiple sclerosis)    Muscle spasticity 05/21/2014   Nonspecific findings on examination of urine    other   Osteopenia    PONV (postoperative nausea and vomiting)    Prediabetes    Sickle cell trait    Status post laparoscopic supracervical hysterectomy 11/26/2014   Tobacco user 11/26/2014   Wrist fracture    Past Surgical History:  Procedure Laterality Date   bilateral tubal ligation  1996   BREAST CYST EXCISION Left 2002   CYST EXCISION Left 05/10/2022   Procedure: CYST REMOVAL;  Surgeon: Lisa Romano, MD;  Location: [facility] ORS;  Service: General;  Laterality: Left;    FRACTURE SURGERY     KNEE SURGERY     right   LAPAROSCOPIC SUPRACERVICAL HYSTERECTOMY  08/05/2013   ORIF WRIST FRACTURE Left 01/17/2017   Procedure: OPEN REDUCTION INTERNAL FIXATION (ORIF) WRIST FRACTURE;  Surgeon: Lisa Lynwood SAUNDERS, MD;  Location: [facility] ORS;  Service: Orthopedics;  Laterality: Left;   RADIOLOGY WITH ANESTHESIA N/A 03/18/2020   Procedure: MRI WITH ANESTHESIA CERVICAL SPINE AND BRAIN  WITH AND WITHOUT CONTRAST;  Surgeon: Radiologist, Medication, MD;  Location: [facility] OR;  Service: Radiology;  Laterality: N/A;   right tibia fracture Right    2024   TUBAL LIGATION Bilateral    VAGINAL HYSTERECTOMY  03/2006   VAGINAL HYSTERECTOMY     2014   Patient Active Problem List   Diagnosis Date Noted   SOB (shortness of breath) on exertion 03/12/2024   Hyperglycemia 01/09/2024   Abnormal LFTs (liver function tests) 09/17/2023   Sinus tachycardia 07/07/2023   Aortic atherosclerosis 01/18/2023   Prediabetes 01/18/2023   Hypokalemia 07/29/2022   Weakness of both lower extremities 07/29/2022   Abscess of left groin 11/15/2021   Abnormal LFTs 11/15/2021   Acute respiratory disease due to COVID-19 virus 11/21/2020   Fatigue due to excessive exertion    Hypoalbuminemia due to protein-calorie malnutrition    Neurogenic bowel    Neurogenic  bladder    Labile blood pressure    Neuropathic pain    Multiple sclerosis, relapsing-remitting 07/09/2019   Abscess of female pelvis    SVT (supraventricular tachycardia)    Radial styloid tenosynovitis 03/12/2018   Wheelchair dependence 02/27/2018   Localized osteoporosis with current pathological fracture with routine healing 01/19/2017   Wrist fracture 01/16/2017   Sprain of ankle 03/23/2016   Closed fracture of lateral malleolus 03/16/2016   Depression screening 01/24/2016   Blood pressure elevated without history of HTN 10/25/2015   Essential hypertension 10/25/2015   Multiple sclerosis 10/02/2015   Chronic left shoulder pain 07/19/2015    Multiple sclerosis exacerbation 07/14/2015   MS (multiple sclerosis) 11/26/2014   Increased body mass index 11/26/2014   HPV test positive 11/26/2014   Status post laparoscopic supracervical hysterectomy 11/26/2014   Galactorrhea 11/26/2014   Back ache 05/21/2014   Adiposity 05/21/2014   Disordered sleep 05/21/2014   Muscle spasticity 05/21/2014   Spasticity 05/21/2014   Calculus of kidney 12/09/2013   Renal colic 12/09/2013   Hypercholesteremia 08/19/2013   Hereditary and idiopathic neuropathy 08/19/2013   Hypercholesterolemia without hypertriglyceridemia 08/19/2013   Bladder infection, chronic 07/25/2012   Disorder of bladder function 07/25/2012   Incomplete bladder emptying 07/25/2012   Microscopic hematuria 07/25/2012   Right upper quadrant pain 07/25/2012   ONSET DATE: 5/24 Amon and broke R leg which caused beginning of ADL decline; diagnosed with MS in 1995)  REFERRING DIAG: MS  THERAPY DIAG:  Muscle weakness (generalized)  Other lack of coordination  Multiple sclerosis exacerbation  Rationale for Evaluation and Treatment: Rehabilitation  SUBJECTIVE:  SUBJECTIVE STATEMENT: Pt reports legs are tired from PT and brought up possibly coming to OT on a day of the week when pt doesn't have PT.  Discussed options and pt wants to continue normal schedule and revisit schedule changes in the new year.  Pt accompanied by: self  PERTINENT HISTORY: Pt reports increased difficulty with basic self care tasks since breaking her R leg last May.  Since that time, mobility and ADLs have declined, with pt requiring assist from private caregivers and family to manage bathing, toileting, dressing, and functional transfers.  PRECAUTIONS: Fall  WEIGHT BEARING RESTRICTIONS: No  PAIN: 03/25/24: No pain today  Are you having pain? No; occasional pain in R lower back, but not today  FALLS: Has patient fallen in last 6 months? No Last fall was May 17 of 2024  LIVING ENVIRONMENT: Lives  with: lives with their family (including mother who has dementia and sister Lisa Williams)  Lives in: 2 level home but pt resides on main level Stairs: ramp  Has following equipment at home: Wheelchair (manual), Shower bench, and hand held shower shower, 1 grab bar in the shower, 3in1 commode, sit to stand lift (electric), FWW, hospital bed  PLOF: modified indep with ADL/IADLs prior to May of 7975   PATIENT GOALS: Increase independence with basic self care tasks  OBJECTIVE:  Note: Objective measures were completed at Evaluation unless otherwise noted.  HAND DOMINANCE: Right  ADLs:  Overall ADLs: assist provided from paid caregiver and sister Transfers/ambulation related to ADLs: sit to stand lift for all transfers, set up for sliding board in/out of bed  Eating: indep  Grooming: modified indep (wc level in mom's bathroom)  UB Dressing: set up (can't access her bedroom closet)  LB Dressing: able to sit on side of bed to don all LB clothing; assist to hike pants/underwear Toileting: assist with clothing management d/t standing within  electric lift Bathing: bed bath with sister helping to wash backside Tub Shower transfers: N/A; pt has walk in shower in mom's bathroom (wc does fit in this bathroom but pt reports inability to transfer from wc and requires arm rests on a bench for a successful transfer attempt from any DME).  Tub bench is in pt's bathroom, but lift does not fit through bathroom doorway and pt reports inability to transfer up from tub bench (currently unable to manage either transfer)   Equipment: see above  IADLs: Shopping: [employer] for shopping Light housekeeping: modified indep from wc level Meal Prep: modified indep from wc level Community mobility: relies on community transportation or family members Medication management: indep Landscape architect: indep Handwriting: NT; pt denies any FMC challenges  MOBILITY STATUS: Hx of falls  POSTURE COMMENTS:  Rounded  shoulders, forward head, anterior pelvic tilt, and weight shift left   ACTIVITY TOLERANCE: Eval: Activity tolerance: Per PT; currently standing 30-60 sec within Light Gait harness/lift; currently non-ambulatory  01/24/24: Standing at sink: 1 minute; min A and 5 attempts to achieve fully erect standing position with BUEs supported on sink countertop, OT assist to block R knee to prevent buckling   02/28/24: x4 attempts, achieves 75% upright standing with MIN A to block knees.   UPPER EXTREMITY ROM:  BUEs WFL  UPPER EXTREMITY MMT:     MMT Right eval Left eval  Shoulder flexion 4+ 4  Shoulder abduction 4+ 4  Shoulder adduction    Shoulder extension    Shoulder internal rotation 4+ 4+  Shoulder external rotation 4 4  Middle trapezius    Lower trapezius    Elbow flexion 4+ 4+  Elbow extension 4+ 4+  Wrist flexion 4+ 4+  Wrist extension 4+ 4+  Wrist ulnar deviation    Wrist radial deviation    Wrist pronation    Wrist supination    (Blank rows = not tested)  HAND FUNCTION/COORDINATION:  R/L WNL; pt denies any coordination deficits/ able to manipulate pills/clothing fasteners/etc without difficulty  11/15/23: *Measures taken d/t PT note revealing pt with increased difficulty manipulating smaller ADL supplies during reaching activities in PT session   11/15/23: Grip strength: R: 70 lbs, L: 63 lbs Pinch strength: Lateral: R: 16 lbs, L: 15 lbs; 3 point pinch: R: 12 lbs, L: 13 lbs 9 hole peg test: Right: 25 sec, L 49 sec   12/25/23: Grip strength: Right: 70 lbs; Left: 65 lbs  9 hole peg test: Right: 24 sec; Left: 3 trials: 54 sec, 1 min, 56 sec  01/24/24: Grip grip: Right: 76 lbs; Left: 67 lbs Lateral pinch: R: 17 lbs, L: 17 lbs; 3 point pinch: R: 13 lbs, L: 14 lbs  9 Hole Peg Test: R: 26 sec, L: 50 sec  02/28/24: Grip grip: Right: 85 lbs; Left: 71 lbs Lateral pinch: R: 20 lbs, L: 19 lbs; 3 point pinch: R: 17 lbs, L: 18 lbs  9 Hole Peg Test: R: 29 sec, L: 40  sec  SENSATION: WFL  EDEMA: No visible edema in BUEs  MUSCLE TONE: BUEs WNL  COGNITION: Overall cognitive status: Within functional limits for tasks assessed  VISION: wears glasses all the time, no reports of diplopia  PERCEPTION: Not tested  PRAXIS: WFL  OBSERVATIONS:  Pt pleasant, cooperative, and motivated to work towards improving indep with ADLs.  TREATMENT DATE: 03/25/24 Self Care: -STS trials at sink countertop with RW, working towards standing pericare for sink bath.  Min A to block R knee and mod vc for forward WS.   -Forward weight shift trials with attempts at initiating pivot, working towards squat pivot transfers wc>mat to progress away from sliding board; max vc for technique.  PATIENT EDUCATION: Education details: Company Secretary Person educated: Patient Education method: Explanation, Actor cues, and Verbal cues Education comprehension: verbalized understanding and returned demonstration  HOME EXERCISE PROGRAM: IR A/AAROM to promote shoulder flexibility for peri care/bathing  GOALS: Goals reviewed with patient? Yes  SHORT TERM GOALS: Target date: 12/25/23  Pt will perform bed bath with set up only. Baseline: Eval: Min-mod A for posterior washing; 09/28/23: Min A for posterior washing; 11/13/23: Able to manage bed bath but sister helps with thoroughness, per pt's preference; pt in agreement to trial bath sitting EOB or in wc before next OT session. Goal status: achieved/d/c  2.  Pt will utilize sliding board for wc<>drop arm commode transfer with SBA. Baseline: Eval: Currently using electric lift for transfer to [facility]; 09/28/23: Not yet completed at home but transfers to commode have been easier without sliding board d/t pt implementing a scoot pivot. Goal status: d/c (pt does not prefer sliding board for commode transfer)  3.  Pt will be indep to perform HEP for  maintaining BUE strength for ADLs and functional transfers. Baseline: Eval: HEP not yet initiated; 09/28/23: Pt is working on core stability exercises from wc, cane stretches for bilat shoulder IR, and tricep extension via wc pushups; 11/13/23: indep Goal status: achieved  LONG TERM GOALS: Target date: 02/05/24  Pt will perform sink bath with set up A. (Revised on 11/13/23 from min A to set up) Baseline: Eval: Currently bed bathing d/t inability to stand at sink without electric lift (lift does not fit into pt's bathroom); 09/28/23: still completing bed bath as pt is not yet able to stand; 11/13/23: Not yet attempted; pt encouraged to attempt before next OT session now that bed bath goal has been met; 12/20/23: Pt is now completing sponge bath with min A while seated EOB (caregiver assists with washing peri area d/t no bed rail to aid in R lateral lean); 01/24/24: Performing with set up on EOB, completing lateral weight shifts to wash posterior/peri area with wc in front of pt and bed rail for LUE support and foot of bed for RUE support  Goal status: achieved  2.  Pt will perform wc<>tub bench transfer with min A. Baseline: Eval: Unable; 09/28/23: Performed in OT clinic with min A, but not yet in the home Goal status: d/c (pt prefers sink bath)  3.  Pt will perform squat pivot transfer wc<>BSC with SBA.  Baseline: Eval: Eval: Currently using electric lift for transfer to [facility]; 09/28/23: Not yet completed in clinic with [facility], and using lift for [facility] in the home still; 11/13/23: Still using Camie lift at home and in clinic transfers are all scoot rather than squat pivots d/t LE weakness; 12/20/23: Pt reports that she plans to use her Camie lift until she can ambulate to her [facility] d/t limited space for wc in her desired location for [facility] at home. Goal status: d/c  4. Pt will perform scoot transfer wc<>toilet using grab bars with supv to allow toileting in community setting.  Baseline: Eval: Pt limits community outings  d/t requiring use of lift for [facility] transfers; 09/28/23: Pt has performed 2 successful scoot pivot  Transfers to toilet in OT clinic with min A, extra time.  Further trials with clothing management on toilet needed, but pt can lower and hike  pants while sititng edge of mat via lateral leaning and min A to maintain core stability; 11/13/23: Pt performs with CGA and extra time; 12/19/23: Pt  performs with close supv on most attempts, occasional CGA; efficiency is improving; 01/24/24: ongoing improvements; able to complete in fewer scoots (3 scoots each way from wc<>toilet) d/t improve engagement of Les and improved anterior weight shift; occasional min guard-supv; pt completes with therapist in clinic, but not yet in other community settings with sister, and not yet voided on toilet in community. 02/28/24: has not trialed in community, completes with SBA    Goal status: ongoing  5.  Pt will complete seated clothing management with min A to enable voiding in handicapped stall within community. Baseline: Recert 11/13/23: Pt can transfer to toilet with increased time and effort, but has not yet attempted clothing management or voiding while seated on commode in community setting.  Pt can lower pants to knees while seated in wc, but requires mod A to hike over hips from seated position; 12/20/23: Pt fully lowers panties and shorts while seated on commode with supv, can hike clothing to her her upper thighs, transfers back to wc, and then performs a tricep extension from wc level to allow caregiver to hike clothing remainder of the way for purposes of EC (min A for clothing management component) Goal status: achieved  6.  Pt will tolerate standing x1 min with close supv and BUE support to manage clothing in prep for toileting. Baseline: Eval: Currently standing in electric lift only, or within parallel bars without lift; 09/28/23: Not yet attempted; 11/13/23: Pt is able to perform static standing with heavy BUE support on  parallel bars with PT; 12/19/22: Not attempted recently d/t new intermittent clonus in BLEs; 01/24/24: Tolerated standing 1 min today at sink with min A for ascent, OT assist to block R knee from buckling. 02/28/24: tolerates up to 1 min standing, difficulty achieving stand 2/2 fatigue from PT.  Goal status: in progress  7.  Pt will increase bilat grip strength in order to securely grasp walker sufficiently for functional transfers and ambulation.  Baseline: Recert 11/13/23: TBD (PT note read following OT session, indicating pt with difficulty securing items in hand);  11/15/23: Grip strength: R: 70 lbs, L: 63 lbs; 12/25/23: R 76 lbs, L 67 lbs; sufficient for functional transfers/amb  Goal status: achieved  8.  Pt will increase [facility] skills in order to manipulate small ADL supplies with reduced dropping as indicated by 20 sec or more improvement in 9 hole peg test. Baseline: Recert 11/13/23: 9 hole peg test TBD (PT note read following OT session, indicating pt with difficulty manipulating and securing items in hands); 11/15/23: Right: 25 sec, L 49 sec; 12/25/23: Right: 24 sec; Left: 3 trials: 54 sec, 1 min, 56 sec; 01/24/24: R: 26 sec, L: 50 sec. 02/28/24: L: 40 sec.  Goal status: in progress  ASSESSMENT: CLINICAL IMPRESSION: Pt was able to achieve 90% of erect standing position at sink countertop with RW and min A on 5 of 8 trials today. Initiated squat pivot transfer training, working towards progression away from sliding board.  Pt is able to lift buttocks above wc arm rests, but not combined with any pivot.  Pt currently able to initiate a pivot only if wc arm rest is removed, but then buttocks  clearance is limited, and ultimately becomes a scoot rather than squat pivot.  Pt requires max vc for forward weight shift and tightening quads to work towards pivot to rely less on BUEs during this transfer attempt.  Will continue to address.  Pt will continue to benefit from skilled OT to work towards above noted  goals in OT poc, working to maximize indep with daily tasks while reducing burden of care on caregivers.   PERFORMANCE DEFICITS: in functional skills including ADLs, IADLs, strength, pain, flexibility, Gross motor control, mobility, balance, body mechanics, endurance, and decreased knowledge of use of DME, and psychosocial skills including coping strategies, environmental adaptation, habits, and routines and behaviors.   IMPAIRMENTS: are limiting patient from ADLs, IADLs, and social participation.   CO-MORBIDITIES: has co-morbidities such as neuropathy, back pain, obesity, MS, HTN that affects occupational performance. Patient will benefit from skilled OT to address above impairments and improve overall function.  MODIFICATION OR ASSISTANCE TO COMPLETE EVALUATION: No modification of tasks or assist necessary to complete an evaluation.  OT OCCUPATIONAL PROFILE AND HISTORY: Detailed assessment: Review of records and additional review of physical, cognitive, psychosocial history related to current functional performance.  CLINICAL DECISION MAKING: Moderate - several treatment options, min-mod task modification necessary  REHAB POTENTIAL: Good  EVALUATION COMPLEXITY: Moderate  PLAN:  OT FREQUENCY: 2x/week  OT DURATION: 12 weeks  PLANNED INTERVENTIONS: 97168 OT Re-evaluation, 97535 self care/ADL training, 02889 therapeutic exercise, 97530 therapeutic activity, 97112 neuromuscular re-education, 97140 manual therapy, 97116 gait training, 02989 moist heat, 97010 cryotherapy, balance training, functional mobility training, psychosocial skills training, energy conservation, coping strategies training, patient/family education, and DME and/or AE instructions  RECOMMENDED OTHER SERVICES: None at this time (Pt currently receiving PT services in this clinic)  CONSULTED AND AGREED WITH PLAN OF CARE: Patient  PLAN FOR NEXT SESSION: see above  Inocente Blazing, MS, OTR/L   Inocente MARLA Blazing,  OT 03/27/2024, 7:42 AM

## 2024-03-27 NOTE — Therapy (Signed)
 OUTPATIENT OCCUPATIONAL THERAPY NEURO TREATMENT NOTE  Patient Name: Lisa Williams MRN: [id number] DOB:1960/10/07, 63 y.o., female Today's Date: 03/27/2024  PCP: Dr. Layman Piety   Neurologist Dr. Suanne at [facility] REFERRING PROVIDER: Dr. Layman Piety    END OF SESSION:  OT End of Session - 03/27/24 1012     Visit Number [id number]    Number of Visits 67    Date for Recertification  04/29/24    Authorization Time Period Reporting period beginning 02/28/24    OT Start Time 1100    OT Stop Time 1145    OT Time Calculation (min) 45 min    Equipment Utilized During Treatment manual wc    Activity Tolerance Patient tolerated treatment well    Behavior During Therapy [facility] for tasks assessed/performed          Past Medical History:  Diagnosis Date   Abdominal pain, right upper quadrant    Back pain    Calculus of kidney 12/09/2013   Chronic back pain    unspecified   Chronic left shoulder pain 07/19/2015   Complication of anesthesia    Functional disorder of bladder    other   Galactorrhea 11/26/2014   Chronic    Hereditary and idiopathic neuropathy 08/19/2013   High cholesterol    History of kidney stones    HPV test positive    Hypercholesteremia 08/19/2013   Hypertension    Hypertension    Hypothyroidism    Incomplete bladder emptying    Microscopic hematuria    MS (multiple sclerosis)    Muscle spasticity 05/21/2014   Nonspecific findings on examination of urine    other   Osteopenia    PONV (postoperative nausea and vomiting)    Prediabetes    Sickle cell trait    Status post laparoscopic supracervical hysterectomy 11/26/2014   Tobacco user 11/26/2014   Wrist fracture    Past Surgical History:  Procedure Laterality Date   bilateral tubal ligation  1996   BREAST CYST EXCISION Left 2002   CYST EXCISION Left 05/10/2022   Procedure: CYST REMOVAL;  Surgeon: Rodolph Romano, MD;  Location: [facility] ORS;  Service: General;  Laterality: Left;    FRACTURE SURGERY     KNEE SURGERY     right   LAPAROSCOPIC SUPRACERVICAL HYSTERECTOMY  08/05/2013   ORIF WRIST FRACTURE Left 01/17/2017   Procedure: OPEN REDUCTION INTERNAL FIXATION (ORIF) WRIST FRACTURE;  Surgeon: Leora Lynwood SAUNDERS, MD;  Location: [facility] ORS;  Service: Orthopedics;  Laterality: Left;   RADIOLOGY WITH ANESTHESIA N/A 03/18/2020   Procedure: MRI WITH ANESTHESIA CERVICAL SPINE AND BRAIN  WITH AND WITHOUT CONTRAST;  Surgeon: Radiologist, Medication, MD;  Location: [facility] OR;  Service: Radiology;  Laterality: N/A;   right tibia fracture Right    2024   TUBAL LIGATION Bilateral    VAGINAL HYSTERECTOMY  03/2006   VAGINAL HYSTERECTOMY     2014   Patient Active Problem List   Diagnosis Date Noted   SOB (shortness of breath) on exertion 03/12/2024   Hyperglycemia 01/09/2024   Abnormal LFTs (liver function tests) 09/17/2023   Sinus tachycardia 07/07/2023   Aortic atherosclerosis 01/18/2023   Prediabetes 01/18/2023   Hypokalemia 07/29/2022   Weakness of both lower extremities 07/29/2022   Abscess of left groin 11/15/2021   Abnormal LFTs 11/15/2021   Acute respiratory disease due to COVID-19 virus 11/21/2020   Fatigue due to excessive exertion    Hypoalbuminemia due to protein-calorie malnutrition    Neurogenic bowel  Neurogenic bladder    Labile blood pressure    Neuropathic pain    Multiple sclerosis, relapsing-remitting 07/09/2019   Abscess of female pelvis    SVT (supraventricular tachycardia)    Radial styloid tenosynovitis 03/12/2018   Wheelchair dependence 02/27/2018   Localized osteoporosis with current pathological fracture with routine healing 01/19/2017   Wrist fracture 01/16/2017   Sprain of ankle 03/23/2016   Closed fracture of lateral malleolus 03/16/2016   Depression screening 01/24/2016   Blood pressure elevated without history of HTN 10/25/2015   Essential hypertension 10/25/2015   Multiple sclerosis 10/02/2015   Chronic left shoulder pain 07/19/2015    Multiple sclerosis exacerbation 07/14/2015   MS (multiple sclerosis) 11/26/2014   Increased body mass index 11/26/2014   HPV test positive 11/26/2014   Status post laparoscopic supracervical hysterectomy 11/26/2014   Galactorrhea 11/26/2014   Back ache 05/21/2014   Adiposity 05/21/2014   Disordered sleep 05/21/2014   Muscle spasticity 05/21/2014   Spasticity 05/21/2014   Calculus of kidney 12/09/2013   Renal colic 12/09/2013   Hypercholesteremia 08/19/2013   Hereditary and idiopathic neuropathy 08/19/2013   Hypercholesterolemia without hypertriglyceridemia 08/19/2013   Bladder infection, chronic 07/25/2012   Disorder of bladder function 07/25/2012   Incomplete bladder emptying 07/25/2012   Microscopic hematuria 07/25/2012   Right upper quadrant pain 07/25/2012   ONSET DATE: 5/24 Amon and broke R leg which caused beginning of ADL decline; diagnosed with MS in 1995)  REFERRING DIAG: MS  THERAPY DIAG:  Multiple sclerosis exacerbation  Other lack of coordination  Muscle weakness (generalized)  Rationale for Evaluation and Treatment: Rehabilitation  SUBJECTIVE:  SUBJECTIVE STATEMENT: Pt reports legs are tired from PT.  Pt accompanied by: self  PERTINENT HISTORY: Pt reports increased difficulty with basic self care tasks since breaking her R leg last May.  Since that time, mobility and ADLs have declined, with pt requiring assist from private caregivers and family to manage bathing, toileting, dressing, and functional transfers.  PRECAUTIONS: Fall  WEIGHT BEARING RESTRICTIONS: No  PAIN: 03/27/24: 2/10 pain L flank Are you having pain? No; occasional pain in R lower back, but not today  FALLS: Has patient fallen in last 6 months? No Last fall was May 17 of 2024  LIVING ENVIRONMENT: Lives with: lives with their family (including mother who has dementia and sister [name])  Lives in: 2 level home but pt resides on main level Stairs: ramp  Has following equipment at home:  Wheelchair (manual), Shower bench, and hand held shower shower, 1 grab bar in the shower, 3in1 commode, sit to stand lift (electric), FWW, hospital bed  PLOF: modified indep with ADL/IADLs prior to May of 7975   PATIENT GOALS: Increase independence with basic self care tasks  OBJECTIVE:  Note: Objective measures were completed at Evaluation unless otherwise noted.  HAND DOMINANCE: Right  ADLs:  Overall ADLs: assist provided from paid caregiver and sister Transfers/ambulation related to ADLs: sit to stand lift for all transfers, set up for sliding board in/out of bed  Eating: indep  Grooming: modified indep (wc level in mom's bathroom)  UB Dressing: set up (can't access her bedroom closet)  LB Dressing: able to sit on side of bed to don all LB clothing; assist to hike pants/underwear Toileting: assist with clothing management d/t standing within electric lift Bathing: bed bath with sister helping to wash backside Tub Shower transfers: N/A; pt has walk in shower in mom's bathroom (wc does fit in this bathroom but pt reports inability to transfer  from wc and requires arm rests on a bench for a successful transfer attempt from any DME).  Tub bench is in pt's bathroom, but lift does not fit through bathroom doorway and pt reports inability to transfer up from tub bench (currently unable to manage either transfer)   Equipment: see above  IADLs: Shopping: [employer] for shopping Light housekeeping: modified indep from wc level Meal Prep: modified indep from wc level Community mobility: relies on community transportation or family members Medication management: indep Landscape architect: indep Handwriting: NT; pt denies any FMC challenges  MOBILITY STATUS: Hx of falls  POSTURE COMMENTS:  Rounded shoulders, forward head, anterior pelvic tilt, and weight shift left   ACTIVITY TOLERANCE: Eval: Activity tolerance: Per PT; currently standing 30-60 sec within Light Gait harness/lift;  currently non-ambulatory  01/24/24: Standing at sink: 1 minute; min A and 5 attempts to achieve fully erect standing position with BUEs supported on sink countertop, OT assist to block R knee to prevent buckling   02/28/24: x4 attempts, achieves 75% upright standing with MIN A to block knees.   UPPER EXTREMITY ROM:  BUEs WFL  UPPER EXTREMITY MMT:     MMT Right eval Left eval  Shoulder flexion 4+ 4  Shoulder abduction 4+ 4  Shoulder adduction    Shoulder extension    Shoulder internal rotation 4+ 4+  Shoulder external rotation 4 4  Middle trapezius    Lower trapezius    Elbow flexion 4+ 4+  Elbow extension 4+ 4+  Wrist flexion 4+ 4+  Wrist extension 4+ 4+  Wrist ulnar deviation    Wrist radial deviation    Wrist pronation    Wrist supination    (Blank rows = not tested)  HAND FUNCTION/COORDINATION:  R/L WNL; pt denies any coordination deficits/ able to manipulate pills/clothing fasteners/etc without difficulty  11/15/23: *Measures taken d/t PT note revealing pt with increased difficulty manipulating smaller ADL supplies during reaching activities in PT session   11/15/23: Grip strength: R: 70 lbs, L: 63 lbs Pinch strength: Lateral: R: 16 lbs, L: 15 lbs; 3 point pinch: R: 12 lbs, L: 13 lbs 9 hole peg test: Right: 25 sec, L 49 sec   12/25/23: Grip strength: Right: 70 lbs; Left: 65 lbs  9 hole peg test: Right: 24 sec; Left: 3 trials: 54 sec, 1 min, 56 sec  01/24/24: Grip grip: Right: 76 lbs; Left: 67 lbs Lateral pinch: R: 17 lbs, L: 17 lbs; 3 point pinch: R: 13 lbs, L: 14 lbs  9 Hole Peg Test: R: 26 sec, L: 50 sec  02/28/24: Grip grip: Right: 85 lbs; Left: 71 lbs Lateral pinch: R: 20 lbs, L: 19 lbs; 3 point pinch: R: 17 lbs, L: 18 lbs  9 Hole Peg Test: R: 29 sec, L: 40 sec  SENSATION: WFL  EDEMA: No visible edema in BUEs  MUSCLE TONE: BUEs WNL  COGNITION: Overall cognitive status: Within functional limits for tasks assessed  VISION: wears glasses all the time, no  reports of diplopia  PERCEPTION: Not tested  PRAXIS: WFL  OBSERVATIONS:  Pt pleasant, cooperative, and motivated to work towards improving indep with ADLs.  TREATMENT DATE: 03/27/24  Therapeutic Activity: -Forward and lateral weight shifts seated in w/c with no arm rest support reaching outside BOS laterally and above head while passing ball to therapist. Focus on core activation and avoiding UE to return to midline.  - Pt worked on L hand [facility] skills grasping 1/4 snap beads from tupperware and snapping ~15 beads together to form bracelet. Repeatedly dropping beads from L hand.    Therapeutic Exercise: - Performed BUE strengthening using a 5# dowel ex. 2/2 to weakness. Bilateral shoulder flexion, chest press, circular patterns, and elbow flexion/extension for 1 set 10 reps each.    PATIENT EDUCATION: Education details: Company Secretary Person educated: Patient Education method: Explanation, Actor cues, and Verbal cues Education comprehension: verbalized understanding and returned demonstration  HOME EXERCISE PROGRAM: IR A/AAROM to promote shoulder flexibility for peri care/bathing  GOALS: Goals reviewed with patient? Yes  SHORT TERM GOALS: Target date: 12/25/23  Pt will perform bed bath with set up only. Baseline: Eval: Min-mod A for posterior washing; 09/28/23: Min A for posterior washing; 11/13/23: Able to manage bed bath but sister helps with thoroughness, per pt's preference; pt in agreement to trial bath sitting EOB or in wc before next OT session. Goal status: achieved/d/c  2.  Pt will utilize sliding board for wc<>drop arm commode transfer with SBA. Baseline: Eval: Currently using electric lift for transfer to [facility]; 09/28/23: Not yet completed at home but transfers to commode have been easier without sliding board d/t pt implementing a scoot pivot. Goal status: d/c (pt does not  prefer sliding board for commode transfer)  3.  Pt will be indep to perform HEP for maintaining BUE strength for ADLs and functional transfers. Baseline: Eval: HEP not yet initiated; 09/28/23: Pt is working on core stability exercises from wc, cane stretches for bilat shoulder IR, and tricep extension via wc pushups; 11/13/23: indep Goal status: achieved  LONG TERM GOALS: Target date: 02/05/24  Pt will perform sink bath with set up A. (Revised on 11/13/23 from min A to set up) Baseline: Eval: Currently bed bathing d/t inability to stand at sink without electric lift (lift does not fit into pt's bathroom); 09/28/23: still completing bed bath as pt is not yet able to stand; 11/13/23: Not yet attempted; pt encouraged to attempt before next OT session now that bed bath goal has been met; 12/20/23: Pt is now completing sponge bath with min A while seated EOB (caregiver assists with washing peri area d/t no bed rail to aid in R lateral lean); 01/24/24: Performing with set up on EOB, completing lateral weight shifts to wash posterior/peri area with wc in front of pt and bed rail for LUE support and foot of bed for RUE support  Goal status: achieved  2.  Pt will perform wc<>tub bench transfer with min A. Baseline: Eval: Unable; 09/28/23: Performed in OT clinic with min A, but not yet in the home Goal status: d/c (pt prefers sink bath)  3.  Pt will perform squat pivot transfer wc<>BSC with SBA.  Baseline: Eval: Eval: Currently using electric lift for transfer to [facility]; 09/28/23: Not yet completed in clinic with [facility], and using lift for [facility] in the home still; 11/13/23: Still using Camie lift at home and in clinic transfers are all scoot rather than squat pivots d/t LE weakness; 12/20/23: Pt reports that she plans to use her Camie lift until she can ambulate to her [facility] d/t limited space for wc in her desired location for [facility] at  home. Goal status: d/c  4. Pt will perform scoot transfer wc<>toilet using grab bars with supv to  allow toileting in community setting.  Baseline: Eval: Pt limits community outings d/t requiring use of lift for [facility] transfers; 09/28/23: Pt has performed 2 successful scoot pivot  Transfers to toilet in OT clinic with min A, extra time.  Further trials with clothing management on toilet needed, but pt can lower and hike  pants while sititng edge of mat via lateral leaning and min A to maintain core stability; 11/13/23: Pt performs with CGA and extra time; 12/19/23: Pt  performs with close supv on most attempts, occasional CGA; efficiency is improving; 01/24/24: ongoing improvements; able to complete in fewer scoots (3 scoots each way from wc<>toilet) d/t improve engagement of Les and improved anterior weight shift; occasional min guard-supv; pt completes with therapist in clinic, but not yet in other community settings with sister, and not yet voided on toilet in community. 02/28/24: has not trialed in community, completes with SBA    Goal status: ongoing  5.  Pt will complete seated clothing management with min A to enable voiding in handicapped stall within community. Baseline: Recert 11/13/23: Pt can transfer to toilet with increased time and effort, but has not yet attempted clothing management or voiding while seated on commode in community setting.  Pt can lower pants to knees while seated in wc, but requires mod A to hike over hips from seated position; 12/20/23: Pt fully lowers panties and shorts while seated on commode with supv, can hike clothing to her her upper thighs, transfers back to wc, and then performs a tricep extension from wc level to allow caregiver to hike clothing remainder of the way for purposes of EC (min A for clothing management component) Goal status: achieved  6.  Pt will tolerate standing x1 min with close supv and BUE support to manage clothing in prep for toileting. Baseline: Eval: Currently standing in electric lift only, or within parallel bars without lift; 09/28/23: Not yet  attempted; 11/13/23: Pt is able to perform static standing with heavy BUE support on parallel bars with PT; 12/19/22: Not attempted recently d/t new intermittent clonus in BLEs; 01/24/24: Tolerated standing 1 min today at sink with min A for ascent, OT assist to block R knee from buckling. 02/28/24: tolerates up to 1 min standing, difficulty achieving stand 2/2 fatigue from PT.  Goal status: in progress  7.  Pt will increase bilat grip strength in order to securely grasp walker sufficiently for functional transfers and ambulation.  Baseline: Recert 11/13/23: TBD (PT note read following OT session, indicating pt with difficulty securing items in hand);  11/15/23: Grip strength: R: 70 lbs, L: 63 lbs; 12/25/23: R 76 lbs, L 67 lbs; sufficient for functional transfers/amb  Goal status: achieved  8.  Pt will increase L [facility] skills in order to manipulate small ADL supplies with reduced dropping as indicated by 20 sec or more improvement in 9 hole peg test. Baseline: Recert 11/13/23: 9 hole peg test TBD (PT note read following OT session, indicating pt with difficulty manipulating and securing items in hands); 11/15/23: Right: 25 sec, L 49 sec; 12/25/23: Right: 24 sec; Left: 3 trials: 54 sec, 1 min, 56 sec; 01/24/24: R: 26 sec, L: 50 sec. 02/28/24: L: 40 sec.  Goal status: in progress  ASSESSMENT: CLINICAL IMPRESSION: Pt arrives fatigued from PT, plan to do seated activity only. Continues to require rest breaks with 5# dowel and functional seated dynamic  throwing activity. Repeatedly dropping items in L hand with [facility] activity. Reviewed pain mgmt techniques and ECS. Pt will continue to benefit from skilled OT to work towards above noted goals in OT poc, working to maximize indep with daily tasks while reducing burden of care on caregivers.   PERFORMANCE DEFICITS: in functional skills including ADLs, IADLs, strength, pain, flexibility, Gross motor control, mobility, balance, body mechanics, endurance, and decreased  knowledge of use of DME, and psychosocial skills including coping strategies, environmental adaptation, habits, and routines and behaviors.   IMPAIRMENTS: are limiting patient from ADLs, IADLs, and social participation.   CO-MORBIDITIES: has co-morbidities such as neuropathy, back pain, obesity, MS, HTN that affects occupational performance. Patient will benefit from skilled OT to address above impairments and improve overall function.  MODIFICATION OR ASSISTANCE TO COMPLETE EVALUATION: No modification of tasks or assist necessary to complete an evaluation.  OT OCCUPATIONAL PROFILE AND HISTORY: Detailed assessment: Review of records and additional review of physical, cognitive, psychosocial history related to current functional performance.  CLINICAL DECISION MAKING: Moderate - several treatment options, min-mod task modification necessary  REHAB POTENTIAL: Good  EVALUATION COMPLEXITY: Moderate  PLAN:  OT FREQUENCY: 2x/week  OT DURATION: 12 weeks  PLANNED INTERVENTIONS: 97168 OT Re-evaluation, 97535 self care/ADL training, 02889 therapeutic exercise, 97530 therapeutic activity, 97112 neuromuscular re-education, 97140 manual therapy, 97116 gait training, 02989 moist heat, 97010 cryotherapy, balance training, functional mobility training, psychosocial skills training, energy conservation, coping strategies training, patient/family education, and DME and/or AE instructions  RECOMMENDED OTHER SERVICES: None at this time (Pt currently receiving PT services in this clinic)  CONSULTED AND AGREED WITH PLAN OF CARE: Patient  PLAN FOR NEXT SESSION: see above  Elston Slot, M.S. OTR/L  03/27/24, 10:13 AM  ascom [phone]    Elston JINNY Slot, OT 03/27/2024, 10:13 AM

## 2024-03-27 NOTE — Therapy (Signed)
 OUTPATIENT PHYSICAL THERAPY NEURO TREATMENT   Patient Name: Lisa Williams MRN: [id number] DOB:[birth year], 63 y.o., female Today's Date: 03/27/2024  PCP: Lenon Na  REFERRING PROVIDER: Lenon Na   END OF SESSION:  PT End of Session - 03/27/24 1255     Visit Number 72    Number of Visits 94    Date for Recertification  06/12/24   corrected   Progress Note Due on Visit 50    PT Start Time 1018    PT Stop Time 1100    PT Time Calculation (min) 42 min    Equipment Utilized During Treatment Gait belt    Activity Tolerance Patient tolerated treatment well;No increased pain    Behavior During Therapy [facility] for tasks assessed/performed           Past Medical History:  Diagnosis Date   Abdominal pain, right upper quadrant    Back pain    Calculus of kidney 12/09/2013   Chronic back pain    unspecified   Chronic left shoulder pain 07/19/2015   Complication of anesthesia    Functional disorder of bladder    other   Galactorrhea 11/26/2014   Chronic    Hereditary and idiopathic neuropathy 08/19/2013   High cholesterol    History of kidney stones    HPV test positive    Hypercholesteremia 08/19/2013   Hypertension    Hypertension    Hypothyroidism    Incomplete bladder emptying    Microscopic hematuria    MS (multiple sclerosis)    Muscle spasticity 05/21/2014   Nonspecific findings on examination of urine    other   Osteopenia    PONV (postoperative nausea and vomiting)    Prediabetes    Sickle cell trait    Status post laparoscopic supracervical hysterectomy 11/26/2014   Tobacco user 11/26/2014   Wrist fracture    Past Surgical History:  Procedure Laterality Date   bilateral tubal ligation  1996   BREAST CYST EXCISION Left 2002   CYST EXCISION Left 05/10/2022   Procedure: CYST REMOVAL;  Surgeon: Rodolph Romano, MD;  Location: [facility] ORS;  Service: General;  Laterality: Left;   FRACTURE SURGERY     KNEE SURGERY      right   LAPAROSCOPIC SUPRACERVICAL HYSTERECTOMY  08/05/2013   ORIF WRIST FRACTURE Left 01/17/2017   Procedure: OPEN REDUCTION INTERNAL FIXATION (ORIF) WRIST FRACTURE;  Surgeon: Leora Lynwood SAUNDERS, MD;  Location: [facility] ORS;  Service: Orthopedics;  Laterality: Left;   RADIOLOGY WITH ANESTHESIA N/A 03/18/2020   Procedure: MRI WITH ANESTHESIA CERVICAL SPINE AND BRAIN  WITH AND WITHOUT CONTRAST;  Surgeon: Radiologist, Medication, MD;  Location: [facility] OR;  Service: Radiology;  Laterality: N/A;   right tibia fracture Right    2024   TUBAL LIGATION Bilateral    VAGINAL HYSTERECTOMY  03/2006   VAGINAL HYSTERECTOMY     2014   Patient Active Problem List   Diagnosis Date Noted   SOB (shortness of breath) on exertion 03/12/2024   Hyperglycemia 01/09/2024   Abnormal LFTs (liver function tests) 09/17/2023   Sinus tachycardia 07/07/2023   Aortic atherosclerosis 01/18/2023   Prediabetes 01/18/2023   Hypokalemia 07/29/2022   Weakness of both lower extremities 07/29/2022   Abscess of left groin 11/15/2021   Abnormal LFTs 11/15/2021   Acute respiratory disease due to COVID-19 virus 11/21/2020   Fatigue due to excessive exertion    Hypoalbuminemia due to protein-calorie malnutrition    Neurogenic bowel    Neurogenic bladder  Labile blood pressure    Neuropathic pain    Multiple sclerosis, relapsing-remitting 07/09/2019   Abscess of female pelvis    SVT (supraventricular tachycardia)    Radial styloid tenosynovitis 03/12/2018   Wheelchair dependence 02/27/2018   Localized osteoporosis with current pathological fracture with routine healing 01/19/2017   Wrist fracture 01/16/2017   Sprain of ankle 03/23/2016   Closed fracture of lateral malleolus 03/16/2016   Depression screening 01/24/2016   Blood pressure elevated without history of HTN 10/25/2015   Essential hypertension 10/25/2015   Multiple sclerosis 10/02/2015   Chronic left shoulder pain 07/19/2015   Multiple sclerosis exacerbation  07/14/2015   MS (multiple sclerosis) 11/26/2014   Increased body mass index 11/26/2014   HPV test positive 11/26/2014   Status post laparoscopic supracervical hysterectomy 11/26/2014   Galactorrhea 11/26/2014   Back ache 05/21/2014   Adiposity 05/21/2014   Disordered sleep 05/21/2014   Muscle spasticity 05/21/2014   Spasticity 05/21/2014   Calculus of kidney 12/09/2013   Renal colic 12/09/2013   Hypercholesteremia 08/19/2013   Hereditary and idiopathic neuropathy 08/19/2013   Hypercholesterolemia without hypertriglyceridemia 08/19/2013   Bladder infection, chronic 07/25/2012   Disorder of bladder function 07/25/2012   Incomplete bladder emptying 07/25/2012   Microscopic hematuria 07/25/2012   Right upper quadrant pain 07/25/2012    ONSET DATE: 1995  REFERRING DIAG: MS  THERAPY DIAG:  Muscle weakness (generalized)  Other lack of coordination  Multiple sclerosis exacerbation  Difficulty in walking, not elsewhere classified  Unsteadiness on feet  Abnormality of gait and mobility  Other abnormalities of gait and mobility  Rationale for Evaluation and Treatment: Rehabilitation  SUBJECTIVE:                                                                                                                                                                                             SUBJECTIVE STATEMENT:  Pt reports she is doing well and is ready to try the platform walkers.    Pt accompanied by: self  PERTINENT HISTORY:  Patient is returning to PT s/p hospitalization.  s/p ORIF of R tibia shaft fracture 09/23/2022. Patient has weakness in BLE with RLE>LLE. She drives with hand controls. Patient has been diagnosed with MS in 1995. PMH includes: back pain, CBP, chronic L shoulder pain, galactorrhea, neuropathy, HPV, hypercholestermeia, HTN, MS, osteopenia, PONV, wrist fracture. Additional order for other closed fracture of proximal end of R tibia with routine healing. Still has to  use [employer].   PAIN:  Are you having pain? Occasional pain in RLE; primarily in knee  PRECAUTIONS: Fall  RED FLAGS: None   WEIGHT BEARING  RESTRICTIONS: No  FALLS: Has patient fallen in last 6 months? No  LIVING ENVIRONMENT: Lives with: lives with their family Lives in: House/apartment Stairs: ramp Has following equipment at home: Vannie - 2 wheeled, Wheelchair (manual), shower chair, and Grab bars  PLOF: Independent with household mobility with device  PATIENT GOALS: to get her independence back. To be able to get into car, toilet, and get dressed independently  OBJECTIVE:  Note: Objective measures were completed at Evaluation unless otherwise noted.  DIAGNOSTIC FINDINGS: MRI of the brain 03/18/2020 showed multiple T2/FLAIR hyperintense foci in the periventricular, juxtacortical and deep white matter.  There were no infratentorial lesions noted.  None of the foci enhanced.   Due to severe claustrophobia, the study was done with conscious sedation in the hospital   COGNITION: Overall cognitive status: Within functional limits for tasks assessed   SENSATION: Lack of sensation in feet, loss in bilateral lateral aspect of knee  COORDINATION: Does not have the strength for functional LE heel slide test   MUSCLE TONE: BLE mild tone    POSTURE: rounded shoulders, forward head, anterior pelvic tilt, and weight shift left   LOWER EXTREMITY MMT:    MMT Right Eval Left Eval  Hip flexion 0.9 1.5  Hip extension    Hip abduction 1.8 2.6  Hip adduction 3.1 1.9  Hip internal rotation    Hip external rotation    Knee flexion 0.9 1.5  Knee extension 0.8 1.2  Ankle dorsiflexion    Ankle plantarflexion    Ankle inversion    Ankle eversion    (Blank rows = not tested)  BED MOBILITY:  Assess in future session due to limited time  TRANSFERS: Assistive device utilized: Bariatric RW with sit to stands, slide board to table  Sit to stand: unable to reach full stand Stand  to sit: unable to reach full stand  Chair to chair: slide board with CGA   GAIT: Unable to ambulate at this time.   FUNCTIONAL TESTS:  Sit to stand: tricep press with BUE; unable to bring arm to walker.    Function In Sitting Test (FIST)  (1/2 femur on surface; hips/knees flexed to 90deg)   - indicate bed or mat table / step stool if used  SCORING KEY: 4 = Independent (completes task independently & successfully) 3 = Verbal Cues/Increased Time (completes task independently & successfully and only needs more time/cues) 2 = Upper Extremity Support (must use UE for support or assistance to complete successfully) 1 = Needs Assistance (unable to complete w/o physical assist; DOCUMENT LEVEL: min, mod, max) 0 = Dependent (requires complete physical assist; unable to complete successfully even w/ physical assist)  Randomly Administer Once Throughout Exam  4 - Anterior Nudge (superior sternum)  4 - Posterior Nudge (between scapular spines)  4 - Lateral Nudge (to dominant side at acromion)     4 - Static sitting (30 seconds)  4 - Sitting, shake 'no' (left and right)  4 - Sitting, eyes closed (30 seconds)   0 - Sitting, lift foot (dominant side, lift foot 1 inch twice)    2 - Pick up object from behind (object at midline, hands breadth posterior)  3 - Forward reach (use dominant arm, must complete full motion) 2 - Lateral reach (use dominant arm, clear opposite ischial tuberosity) 2 - Pick up object from floor (from between feet)   2 - Posterior scooting (move backwards 2 inches)  2 - Anterior scooting (move forward 2 inches)  2 -  Lateral scooting (move to dominant side 2 inches)    TOTAL = 39/56  Notes/comments: slide board tranfer to/from table   MCD > 5 points MCID for IP REHAB > 6 points  Function In Sitting Test (FIST) 08/14/23 (1/2 femur on surface; hips/knees flexed to 90deg)   - indicate bed or mat table / step stool if used  SCORING KEY: 4 = Independent (completes  task independently & successfully) 3 = Verbal Cues/Increased Time (completes task independently & successfully and only needs more time/cues) 2 = Upper Extremity Support (must use UE for support or assistance to complete successfully) 1 = Needs Assistance (unable to complete w/o physical assist; DOCUMENT LEVEL: min, mod, max) 0 = Dependent (requires complete physical assist; unable to complete successfully even w/ physical assist)  Randomly Administer Once Throughout Exam  4 - Anterior Nudge (superior sternum)  4 - Posterior Nudge (between scapular spines)  4 - Lateral Nudge (to dominant side at acromion)     4 - Static sitting (30 seconds)  4 - Sitting, shake 'no' (left and right)  4 - Sitting, eyes closed (30 seconds)   0 - Sitting, lift foot (dominant side, lift foot 1 inch twice)    4 - Pick up object from behind (object at midline, hands breadth posterior)  4 - Forward reach (use dominant arm, must complete full motion) 4 - Lateral reach (use dominant arm, clear opposite ischial tuberosity)  - Pick up object from floor (from between feet)   3 - Posterior scooting (move backwards 2 inches)  3 - Anterior scooting (move forward 2 inches)  3 - Lateral scooting (move to dominant side 2 inches)    TOTAL = 47/56  MCD > 5 points MCID for IP REHAB > 6 points   TREATMENT DATE: 03/27/24  TherAct:  Pt attempted to perform STS with use of platform walker x6 total attempts, however unable to fully come upright into standing position.  Pt very fatigued at the conclusion of this section due to the amount of attempts and required rest breaks in between.   Pt will continue to benefit from skilled therapy to address remaining deficits in order to improve overall QoL and return to PLOF.      Gait:  Pt ambulated for a total of 2 attempts, 14' on first attempt, 6' on the second attempt (20' 1 in total).  Pt utilized the bariatric FWW, pillow under the thighs for easier ability to come  upright, and sock applied to the L foot to reduce friction of the foot in mobility.   Rest breaks given and pt's symptoms monitored throughout the session.     PATIENT EDUCATION:  Education details: Pt educated throughout session about proper posture and technique with exercises. Improved exercise technique, movement at target joints, use of target muscles after min to mod verbal, visual, tactile cues.  Person educated: Patient Education method: Explanation, Demonstration, Tactile cues, and Verbal cues Education comprehension: verbalized understanding, returned demonstration, verbal cues required, tactile cues required, and needs further education    HOME EXERCISE PROGRAM: Stand 3x/day in stander   GOALS: Goals reviewed with patient? Yes  SHORT TERM GOALS: Target date: 08/08/2023  Patient will be independent in home exercise program to improve strength/mobility for better functional independence with ADLs.  Baseline: 4/8: compliance Goal status: MET    LONG TERM GOALS: Target date: 03/20/2024  Patient will tolerate five consecutive stands with UE support from wheelchair to standing to improve functional mobility with additional cushion and  RW.  Baseline: unable to perform 4/8:unable to perform full stand 5/20: perform next session 5/29: two full stands with CGA and cushion on her seat 7/31: able to stand 3 times from [facility]. With no addition cushion  in parallel bars. 12/27/2023= Will retest next session- concentrated on just standing on past 2 visits 03/20/24:  Goal status: Ongoing   2.  Patient will ambulate 10 ft with bRW with wheelchair follow.  Baseline:  unable to ambulate 4/8: unable to ambulate 5/20: able to ambulate length of // bars in previous session 5/27: ambulate 2.5 lengths of // bars with two seated rest breaks with min A for weight shift and wheelchair follow  7/31: able to ambulate in parallel bars 3 ft with min assist and +2 follow in WC> difficulty advancing the  LLE on this day. 12/27/2023- Patient unable to take a step forward today but was abel to take a step on previous visit- limited by clonus like trembling that has progressed over past month. 03/20/24: 26' at one time Goal status: MET   3.  Patient will improve FIST score >6 points to demonstrate improved stability and ability to perform ADLs.  Baseline:  3/6: 39/56 4/8: 47/ 56 5/20: 48/56 7/31:46/56 Goal status: MET  4.  Patient will perform toileting with mod I at home for improved independence.  Baseline: 3/6: requires assistance 4/8: requires dependence on machine 5/20: requires assistance with use of wipe buddy.  7/31: continues to require use of sara steady and wipe buddy for safety and hygiene. 12/27/2023- Patient reports that since she is unable to take steps that she continues to require use of sara steady and wipe buddy for safe personal hygiene. 03/20/24: Pt utilizing sara stedy to perform safe personal hygiene, however is no longer utilizing the wipe buddy. Goal status: PROGRESSING   5. Patient will perform > 5 min of static or dynamic standing with BUE and Min/mod assist  for improved LE strength and pre-gait function to assist with toileting and overall standing ability for improve functional independence.  Baseline: 12/27/2023-Patient was able to stand for just over 3 min today. 01/10/24: Pt able to stand 1 min 38 sec 02/14/24: Pt able to >3 minutes at this time. 03/20/24: Pt able to achieve >4 minutes at this time. Goal status: PROGRESSING  6.  Patient will ambulate 40 ft with BRW with wheelchair follow, in one attempt, without rest break. Baseline: 49' during one attempt. Goal status: NEW   ASSESSMENT:  CLINICAL IMPRESSION:  Pt unable to perform ambulation attempt with the platform walker, but attempted on multiple occasions. Pt noted fatigue after this and was evident with her ambulation attempts.  Pt still put forth great effort throughout the session and will continue to  improve overall tolerance to standing/ambulation.   Pt will continue to benefit from skilled therapy to address remaining deficits in order to improve overall QoL and return to PLOF.        OBJECTIVE IMPAIRMENTS: Abnormal gait, cardiopulmonary status limiting activity, decreased activity tolerance, decreased balance, decreased coordination, decreased endurance, decreased mobility, difficulty walking, decreased ROM, decreased strength, hypomobility, increased fascial restrictions, impaired perceived functional ability, impaired flexibility, impaired sensation, improper body mechanics, and postural dysfunction.   ACTIVITY LIMITATIONS: carrying, lifting, bending, sitting, standing, squatting, sleeping, stairs, transfers, bed mobility, continence, bathing, toileting, dressing, self feeding, reach over head, hygiene/grooming, locomotion level, and caring for others  PARTICIPATION LIMITATIONS: meal prep, cleaning, laundry, interpersonal relationship, driving, shopping, community activity, and church  PERSONAL FACTORS: Age, Past/current  experiences, Time since onset of injury/illness/exacerbation, Transportation, and 3+ comorbidities: ack pain, CBP, chronic L shoulder pain, galactorrhea, neuropathy, HPV, hypercholestermeia, HTN, MS, osteopenia, PONV, wrist fracture are also affecting patient's functional outcome.   REHAB POTENTIAL: Good  CLINICAL DECISION MAKING: Evolving/moderate complexity  EVALUATION COMPLEXITY: Moderate  PLAN:  PT FREQUENCY: 2x/week  PT DURATION: 12 weeks  PLANNED INTERVENTIONS: 97164- PT Re-evaluation, 97110-Therapeutic exercises, 97530- Therapeutic activity, 97112- Neuromuscular re-education, 97535- Self Care, [id number]- Manual therapy, [phone]- Gait training, [phone]- Orthotic Fit/training, [phone]- Canalith repositioning, [id number]- Aquatic Therapy, [phone]- Electrical stimulation (unattended), [phone]- Electrical stimulation (manual), [id number]- Ultrasound, [id number]- Traction (mechanical),  Patient/Family education, Balance training, Stair training, Taping, Dry Needling, Joint mobilization, Joint manipulation, Spinal manipulation, Spinal mobilization, Scar mobilization, Compression bandaging, Vestibular training, Visual/preceptual remediation/compensation, Cognitive remediation, DME instructions, Cryotherapy, Moist heat, and Biofeedback  PLAN FOR NEXT SESSION:  Strap stand frame with more dynamic activities Sit to stands; try supported walking, continue dynamic core stabilization training and seated reaching tasks to improve weight shifting.    Fonda Simpers, PT, DPT Physical Therapist - [facility]  03/27/24, 1:11 PM

## 2024-03-30 DIAGNOSIS — R2689 Other abnormalities of gait and mobility: Secondary | ICD-10-CM | POA: Diagnosis not present

## 2024-04-01 ENCOUNTER — Ambulatory Visit: Payer: PPO

## 2024-04-01 ENCOUNTER — Ambulatory Visit

## 2024-04-07 ENCOUNTER — Other Ambulatory Visit

## 2024-04-08 ENCOUNTER — Ambulatory Visit: Payer: PPO | Attending: Neurology

## 2024-04-08 ENCOUNTER — Ambulatory Visit

## 2024-04-08 ENCOUNTER — Other Ambulatory Visit
Admission: RE | Admit: 2024-04-08 | Discharge: 2024-04-08 | Disposition: A | Attending: Internal Medicine | Admitting: Internal Medicine

## 2024-04-08 ENCOUNTER — Ambulatory Visit: Payer: Self-pay | Admitting: Internal Medicine

## 2024-04-08 DIAGNOSIS — R278 Other lack of coordination: Secondary | ICD-10-CM

## 2024-04-08 DIAGNOSIS — R269 Unspecified abnormalities of gait and mobility: Secondary | ICD-10-CM | POA: Diagnosis present

## 2024-04-08 DIAGNOSIS — E7800 Pure hypercholesterolemia, unspecified: Secondary | ICD-10-CM | POA: Insufficient documentation

## 2024-04-08 DIAGNOSIS — R2689 Other abnormalities of gait and mobility: Secondary | ICD-10-CM | POA: Insufficient documentation

## 2024-04-08 DIAGNOSIS — M6281 Muscle weakness (generalized): Secondary | ICD-10-CM | POA: Insufficient documentation

## 2024-04-08 DIAGNOSIS — G35D Multiple sclerosis, unspecified: Secondary | ICD-10-CM

## 2024-04-08 DIAGNOSIS — R2681 Unsteadiness on feet: Secondary | ICD-10-CM | POA: Insufficient documentation

## 2024-04-08 DIAGNOSIS — R262 Difficulty in walking, not elsewhere classified: Secondary | ICD-10-CM | POA: Insufficient documentation

## 2024-04-08 LAB — TSH: TSH: 5.1 u[IU]/mL — ABNORMAL HIGH (ref 0.350–4.500)

## 2024-04-08 NOTE — Therapy (Signed)
 OUTPATIENT OCCUPATIONAL THERAPY NEURO TREATMENT NOTE  Patient Name: Lisa Williams MRN: [id number] DOB:[birth year], 63 y.o., female Today's Date: 04/08/2024  PCP: Dr. Layman Piety   Neurologist Dr. Suanne at [facility] REFERRING PROVIDER: Dr. Layman Piety    END OF SESSION:  OT End of Session - 04/08/24 1306     Visit Number 59    Number of Visits 67    Date for Recertification  04/29/24    Authorization Time Period Reporting period beginning 02/28/24    Progress Note Due on Visit 60    OT Start Time 1103    OT Stop Time 1141    OT Time Calculation (min) 38 min    Equipment Utilized During Treatment manual wc    Activity Tolerance Patient tolerated treatment well    Behavior During Therapy [facility] for tasks assessed/performed         Past Medical History:  Diagnosis Date   Abdominal pain, right upper quadrant    Back pain    Calculus of kidney 12/09/2013   Chronic back pain    unspecified   Chronic left shoulder pain 07/19/2015   Complication of anesthesia    Functional disorder of bladder    other   Galactorrhea 11/26/2014   Chronic    Hereditary and idiopathic neuropathy 08/19/2013   High cholesterol    History of kidney stones    HPV test positive    Hypercholesteremia 08/19/2013   Hypertension    Hypertension    Hypothyroidism    Incomplete bladder emptying    Microscopic hematuria    MS (multiple sclerosis)    Muscle spasticity 05/21/2014   Nonspecific findings on examination of urine    other   Osteopenia    PONV (postoperative nausea and vomiting)    Prediabetes    Sickle cell trait    Status post laparoscopic supracervical hysterectomy 11/26/2014   Tobacco user 11/26/2014   Wrist fracture    Past Surgical History:  Procedure Laterality Date   bilateral tubal ligation  1996   BREAST CYST EXCISION Left 2002   CYST EXCISION Left 05/10/2022   Procedure: CYST REMOVAL;  Surgeon: Rodolph Romano, MD;  Location: [facility] ORS;  Service:  General;  Laterality: Left;   FRACTURE SURGERY     KNEE SURGERY     right   LAPAROSCOPIC SUPRACERVICAL HYSTERECTOMY  08/05/2013   ORIF WRIST FRACTURE Left 01/17/2017   Procedure: OPEN REDUCTION INTERNAL FIXATION (ORIF) WRIST FRACTURE;  Surgeon: Leora Lynwood SAUNDERS, MD;  Location: [facility] ORS;  Service: Orthopedics;  Laterality: Left;   RADIOLOGY WITH ANESTHESIA N/A 03/18/2020   Procedure: MRI WITH ANESTHESIA CERVICAL SPINE AND BRAIN  WITH AND WITHOUT CONTRAST;  Surgeon: Radiologist, Medication, MD;  Location: [facility] OR;  Service: Radiology;  Laterality: N/A;   right tibia fracture Right    2024   TUBAL LIGATION Bilateral    VAGINAL HYSTERECTOMY  03/2006   VAGINAL HYSTERECTOMY     2014   Patient Active Problem List   Diagnosis Date Noted   SOB (shortness of breath) on exertion 03/12/2024   Hyperglycemia 01/09/2024   Abnormal LFTs (liver function tests) 09/17/2023   Sinus tachycardia 07/07/2023   Aortic atherosclerosis 01/18/2023   Prediabetes 01/18/2023   Hypokalemia 07/29/2022   Weakness of both lower extremities 07/29/2022   Abscess of left groin 11/15/2021   Abnormal LFTs 11/15/2021   Acute respiratory disease due to COVID-19 virus 11/21/2020   Fatigue due to excessive exertion    Hypoalbuminemia due to protein-calorie malnutrition  Neurogenic bowel    Neurogenic bladder    Labile blood pressure    Neuropathic pain    Multiple sclerosis, relapsing-remitting 07/09/2019   Abscess of female pelvis    SVT (supraventricular tachycardia)    Radial styloid tenosynovitis 03/12/2018   Wheelchair dependence 02/27/2018   Localized osteoporosis with current pathological fracture with routine healing 01/19/2017   Wrist fracture 01/16/2017   Sprain of ankle 03/23/2016   Closed fracture of lateral malleolus 03/16/2016   Depression screening 01/24/2016   Blood pressure elevated without history of HTN 10/25/2015   Essential hypertension 10/25/2015   Multiple sclerosis 10/02/2015   Chronic  left shoulder pain 07/19/2015   Multiple sclerosis exacerbation 07/14/2015   MS (multiple sclerosis) 11/26/2014   Increased body mass index 11/26/2014   HPV test positive 11/26/2014   Status post laparoscopic supracervical hysterectomy 11/26/2014   Galactorrhea 11/26/2014   Back ache 05/21/2014   Adiposity 05/21/2014   Disordered sleep 05/21/2014   Muscle spasticity 05/21/2014   Spasticity 05/21/2014   Calculus of kidney 12/09/2013   Renal colic 12/09/2013   Hypercholesteremia 08/19/2013   Hereditary and idiopathic neuropathy 08/19/2013   Hypercholesterolemia without hypertriglyceridemia 08/19/2013   Bladder infection, chronic 07/25/2012   Disorder of bladder function 07/25/2012   Incomplete bladder emptying 07/25/2012   Microscopic hematuria 07/25/2012   Right upper quadrant pain 07/25/2012   ONSET DATE: 5/24 Amon and broke R leg which caused beginning of ADL decline; diagnosed with MS in 1995)  REFERRING DIAG: MS  THERAPY DIAG:  Muscle weakness (generalized)  Other lack of coordination  Multiple sclerosis exacerbation  Rationale for Evaluation and Treatment: Rehabilitation  SUBJECTIVE:  SUBJECTIVE STATEMENT: Pt reports doing well today, but legs are feeling more tired, possibly d/t the cold, rainy weather.   Pt accompanied by: self  PERTINENT HISTORY: Pt reports increased difficulty with basic self care tasks since breaking her R leg last May.  Since that time, mobility and ADLs have declined, with pt requiring assist from private caregivers and family to manage bathing, toileting, dressing, and functional transfers.  PRECAUTIONS: Fall  WEIGHT BEARING RESTRICTIONS: No  PAIN: 04/08/24: No pain today  Are you having pain? No; occasional pain in R lower back, but not today  FALLS: Has patient fallen in last 6 months? No Last fall was May 17 of 2024  LIVING ENVIRONMENT: Lives with: lives with their family (including mother who has dementia and sister [name])  Lives  in: 2 level home but pt resides on main level Stairs: ramp  Has following equipment at home: Wheelchair (manual), Shower bench, and hand held shower shower, 1 grab bar in the shower, 3in1 commode, sit to stand lift (electric), FWW, hospital bed  PLOF: modified indep with ADL/IADLs prior to May of 7975   PATIENT GOALS: Increase independence with basic self care tasks  OBJECTIVE:  Note: Objective measures were completed at Evaluation unless otherwise noted.  HAND DOMINANCE: Right  ADLs:  Overall ADLs: assist provided from paid caregiver and sister Transfers/ambulation related to ADLs: sit to stand lift for all transfers, set up for sliding board in/out of bed  Eating: indep  Grooming: modified indep (wc level in mom's bathroom)  UB Dressing: set up (can't access her bedroom closet)  LB Dressing: able to sit on side of bed to don all LB clothing; assist to hike pants/underwear Toileting: assist with clothing management d/t standing within electric lift Bathing: bed bath with sister helping to wash backside Tub Shower transfers: N/A; pt has walk in  shower in mom's bathroom (wc does fit in this bathroom but pt reports inability to transfer from wc and requires arm rests on a bench for a successful transfer attempt from any DME).  Tub bench is in pt's bathroom, but lift does not fit through bathroom doorway and pt reports inability to transfer up from tub bench (currently unable to manage either transfer)   Equipment: see above  IADLs: Shopping: [employer] for shopping Light housekeeping: modified indep from wc level Meal Prep: modified indep from wc level Community mobility: relies on community transportation or family members Medication management: indep Landscape architect: indep Handwriting: NT; pt denies any [facility]MC challenges  MOBILITY STATUS: Hx of falls  POSTURE COMMENTS:  Rounded shoulders, forward head, anterior pelvic tilt, and weight shift left   ACTIVITY TOLERANCE: Eval:  Activity tolerance: Per PT; currently standing 30-60 sec within Light Gait harness/lift; currently non-ambulatory  01/24/24: Standing at sink: 1 minute; min A and 5 attempts to achieve fully erect standing position with BUEs supported on sink countertop, OT assist to block R knee to prevent buckling   02/28/24: x4 attempts, achieves 75% upright standing with MIN A to block knees.   UPPER EXTREMITY ROM:  BUEs WFL  UPPER EXTREMITY MMT:     MMT Right eval Left eval  Shoulder flexion 4+ 4  Shoulder abduction 4+ 4  Shoulder adduction    Shoulder extension    Shoulder internal rotation 4+ 4+  Shoulder external rotation 4 4  Middle trapezius    Lower trapezius    Elbow flexion 4+ 4+  Elbow extension 4+ 4+  Wrist flexion 4+ 4+  Wrist extension 4+ 4+  Wrist ulnar deviation    Wrist radial deviation    Wrist pronation    Wrist supination    (Blank rows = not tested)  HAND FUNCTION/COORDINATION:  R/L WNL; pt denies any coordination deficits/ able to manipulate pills/clothing fasteners/etc without difficulty  11/15/23: *Measures taken d/t PT note revealing pt with increased difficulty manipulating smaller ADL supplies during reaching activities in PT session   11/15/23: Grip strength: R: 70 lbs, L: 63 lbs Pinch strength: Lateral: R: 16 lbs, L: 15 lbs; 3 point pinch: R: 12 lbs, L: 13 lbs 9 hole peg test: Right: 25 sec, L 49 sec   12/25/23: Grip strength: Right: 70 lbs; Left: 65 lbs  9 hole peg test: Right: 24 sec; Left: 3 trials: 54 sec, 1 min, 56 sec  01/24/24: Grip grip: Right: 76 lbs; Left: 67 lbs Lateral pinch: R: 17 lbs, L: 17 lbs; 3 point pinch: R: 13 lbs, L: 14 lbs  9 Hole Peg Test: R: 26 sec, L: 50 sec  02/28/24: Grip grip: Right: 85 lbs; Left: 71 lbs Lateral pinch: R: 20 lbs, L: 19 lbs; 3 point pinch: R: 17 lbs, L: 18 lbs  9 Hole Peg Test: R: 29 sec, L: 40 sec  SENSATION: WFL  EDEMA: No visible edema in BUEs  MUSCLE TONE: BUEs WNL  COGNITION: Overall cognitive  status: Within functional limits for tasks assessed  VISION: wears glasses all the time, no reports of diplopia  PERCEPTION: Not tested  PRAXIS: WFL  OBSERVATIONS:  Pt pleasant, cooperative, and motivated to work towards improving indep with ADLs.  TREATMENT DATE: 04/08/24 Self Care: -STS trials from edge of elevated mat table, working towards standing pericare for sink bath.  Mod A to block B knee and mod vc for forward WS.  Foam wedge mat placed to pt's L side for easing LUE push from seated surface.   -Scoot pivot wc<>mat table without sliding board, working towards squat pivot, requiring min A for adjustment of foot placement and mod vc for anterior WS  Therapeutic Activity: -Facilitated core strengthening/dynamic sitting balance/BUE/BLE strengthening with pt sitting edge of elevated mat table to catch, toss, and kick a rolled ball without UE support on mat table.  Mat elevated enough for feet to only brush the floor, further increasing core stability challenge d/t feet unsupported.   PATIENT EDUCATION: Education details: Company Secretary Person educated: Patient Education method: Explanation, Actor cues, and Verbal cues Education comprehension: verbalized understanding and returned demonstration  HOME EXERCISE PROGRAM: IR A/AAROM to promote shoulder flexibility for peri care/bathing  GOALS: Goals reviewed with patient? Yes  SHORT TERM GOALS: Target date: 12/25/23  Pt will perform bed bath with set up only. Baseline: Eval: Min-mod A for posterior washing; 09/28/23: Min A for posterior washing; 11/13/23: Able to manage bed bath but sister helps with thoroughness, per pt's preference; pt in agreement to trial bath sitting EOB or in wc before next OT session. Goal status: achieved/d/c  2.  Pt will utilize sliding board for wc<>drop arm commode transfer with SBA. Baseline: Eval:  Currently using electric lift for transfer to [facility]; 09/28/23: Not yet completed at home but transfers to commode have been easier without sliding board d/t pt implementing a scoot pivot. Goal status: d/c (pt does not prefer sliding board for commode transfer)  3.  Pt will be indep to perform HEP for maintaining BUE strength for ADLs and functional transfers. Baseline: Eval: HEP not yet initiated; 09/28/23: Pt is working on core stability exercises from wc, cane stretches for bilat shoulder IR, and tricep extension via wc pushups; 11/13/23: indep Goal status: achieved  LONG TERM GOALS: Target date: 02/05/24  Pt will perform sink bath with set up A. (Revised on 11/13/23 from min A to set up) Baseline: Eval: Currently bed bathing d/t inability to stand at sink without electric lift (lift does not fit into pt's bathroom); 09/28/23: still completing bed bath as pt is not yet able to stand; 11/13/23: Not yet attempted; pt encouraged to attempt before next OT session now that bed bath goal has been met; 12/20/23: Pt is now completing sponge bath with min A while seated EOB (caregiver assists with washing peri area d/t no bed rail to aid in R lateral lean); 01/24/24: Performing with set up on EOB, completing lateral weight shifts to wash posterior/peri area with wc in front of pt and bed rail for LUE support and foot of bed for RUE support  Goal status: achieved  2.  Pt will perform wc<>tub bench transfer with min A. Baseline: Eval: Unable; 09/28/23: Performed in OT clinic with min A, but not yet in the home Goal status: d/c (pt prefers sink bath)  3.  Pt will perform squat pivot transfer wc<>BSC with SBA.  Baseline: Eval: Eval: Currently using electric lift for transfer to [facility]; 09/28/23: Not yet completed in clinic with [facility], and using lift for [facility] in the home still; 11/13/23: Still using Camie lift at home and in clinic transfers are all scoot rather than squat pivots d/t LE weakness; 12/20/23: Pt reports that she plans  to use  her Camie lift until she can ambulate to her [facility] d/t limited space for wc in her desired location for [facility] at home. Goal status: d/c  4. Pt will perform scoot transfer wc<>toilet using grab bars with supv to allow toileting in community setting.  Baseline: Eval: Pt limits community outings d/t requiring use of lift for [facility] transfers; 09/28/23: Pt has performed 2 successful scoot pivot  Transfers to toilet in OT clinic with min A, extra time.  Further trials with clothing management on toilet needed, but pt can lower and hike  pants while sititng edge of mat via lateral leaning and min A to maintain core stability; 11/13/23: Pt performs with CGA and extra time; 12/19/23: Pt  performs with close supv on most attempts, occasional CGA; efficiency is improving; 01/24/24: ongoing improvements; able to complete in fewer scoots (3 scoots each way from wc<>toilet) d/t improve engagement of Les and improved anterior weight shift; occasional min guard-supv; pt completes with therapist in clinic, but not yet in other community settings with sister, and not yet voided on toilet in community. 02/28/24: has not trialed in community, completes with SBA    Goal status: ongoing  5.  Pt will complete seated clothing management with min A to enable voiding in handicapped stall within community. Baseline: Recert 11/13/23: Pt can transfer to toilet with increased time and effort, but has not yet attempted clothing management or voiding while seated on commode in community setting.  Pt can lower pants to knees while seated in wc, but requires mod A to hike over hips from seated position; 12/20/23: Pt fully lowers panties and shorts while seated on commode with supv, can hike clothing to her her upper thighs, transfers back to wc, and then performs a tricep extension from wc level to allow caregiver to hike clothing remainder of the way for purposes of EC (min A for clothing management component) Goal status: achieved  6.  Pt  will tolerate standing x1 min with close supv and BUE support to manage clothing in prep for toileting. Baseline: Eval: Currently standing in electric lift only, or within parallel bars without lift; 09/28/23: Not yet attempted; 11/13/23: Pt is able to perform static standing with heavy BUE support on parallel bars with PT; 12/19/22: Not attempted recently d/t new intermittent clonus in BLEs; 01/24/24: Tolerated standing 1 min today at sink with min A for ascent, OT assist to block R knee from buckling. 02/28/24: tolerates up to 1 min standing, difficulty achieving stand 2/2 fatigue from PT.  Goal status: in progress  7.  Pt will increase bilat grip strength in order to securely grasp walker sufficiently for functional transfers and ambulation.  Baseline: Recert 11/13/23: TBD (PT note read following OT session, indicating pt with difficulty securing items in hand);  11/15/23: Grip strength: R: 70 lbs, L: 63 lbs; 12/25/23: R 76 lbs, L 67 lbs; sufficient for functional transfers/amb  Goal status: achieved  8.  Pt will increase [facility] skills in order to manipulate small ADL supplies with reduced dropping as indicated by 20 sec or more improvement in 9 hole peg test. Baseline: Recert 11/13/23: 9 hole peg test TBD (PT note read following OT session, indicating pt with difficulty manipulating and securing items in hands); 11/15/23: Right: 25 sec, L 49 sec; 12/25/23: Right: 24 sec; Left: 3 trials: 54 sec, 1 min, 56 sec; 01/24/24: R: 26 sec, L: 50 sec. 02/28/24: L: 40 sec.  Goal status: in progress  ASSESSMENT: CLINICAL IMPRESSION: Pt was  able to achieve 80-90% of erect standing position x2 of 10+ trials from elevated mat table.  Pt required more assist for blocking bilat knees and cueing for forward WS today, but did acknowledge legs feeling more tired today.  Pt was able to maintain sitting balance today without UE or LE support, arms crossed when kicking ball, and cued to reach overhead to catch ball to increase core  stability and UE challenge.  Pt able to move slightly out of midline sitting from all directions and return to midline without UE support.  Pt will continue to benefit from skilled OT to work towards above noted goals in OT poc, working to maximize indep with daily tasks while reducing burden of care on caregivers.   PERFORMANCE DEFICITS: in functional skills including ADLs, IADLs, strength, pain, flexibility, Gross motor control, mobility, balance, body mechanics, endurance, and decreased knowledge of use of DME, and psychosocial skills including coping strategies, environmental adaptation, habits, and routines and behaviors.   IMPAIRMENTS: are limiting patient from ADLs, IADLs, and social participation.   CO-MORBIDITIES: has co-morbidities such as neuropathy, back pain, obesity, MS, HTN that affects occupational performance. Patient will benefit from skilled OT to address above impairments and improve overall function.  MODIFICATION OR ASSISTANCE TO COMPLETE EVALUATION: No modification of tasks or assist necessary to complete an evaluation.  OT OCCUPATIONAL PROFILE AND HISTORY: Detailed assessment: Review of records and additional review of physical, cognitive, psychosocial history related to current functional performance.  CLINICAL DECISION MAKING: Moderate - several treatment options, min-mod task modification necessary  REHAB POTENTIAL: Good  EVALUATION COMPLEXITY: Moderate  PLAN:  OT FREQUENCY: 2x/week  OT DURATION: 12 weeks  PLANNED INTERVENTIONS: 97168 OT Re-evaluation, 97535 self care/ADL training, 02889 therapeutic exercise, 97530 therapeutic activity, 97112 neuromuscular re-education, 97140 manual therapy, 97116 gait training, 02989 moist heat, 97010 cryotherapy, balance training, functional mobility training, psychosocial skills training, energy conservation, coping strategies training, patient/family education, and DME and/or AE instructions  RECOMMENDED OTHER SERVICES: None  at this time (Pt currently receiving PT services in this clinic)  CONSULTED AND AGREED WITH PLAN OF CARE: Patient  PLAN FOR NEXT SESSION: see above  Inocente Blazing, MS, OTR/L   Inocente MARLA Blazing, OT 04/08/2024, 1:27 PM

## 2024-04-08 NOTE — Therapy (Signed)
 OUTPATIENT PHYSICAL THERAPY NEURO TREATMENT   Patient Name: Lisa Williams MRN: [id number] DOB:01/20/1961, 63 y.o., female Today's Date: 04/08/2024  PCP: Lenon Na  REFERRING PROVIDER: Lenon Na   END OF SESSION:  PT End of Session - 04/08/24 1154     Visit Number 73    Number of Visits 94    Date for Recertification  06/12/24   corrected   Progress Note Due on Visit 50    PT Start Time 1019    PT Stop Time 1100    PT Time Calculation (min) 41 min    Equipment Utilized During Treatment Gait belt    Activity Tolerance Patient tolerated treatment well;No increased pain    Behavior During Therapy [facility] for tasks assessed/performed         Past Medical History:  Diagnosis Date   Abdominal pain, right upper quadrant    Back pain    Calculus of kidney 12/09/2013   Chronic back pain    unspecified   Chronic left shoulder pain 07/19/2015   Complication of anesthesia    Functional disorder of bladder    other   Galactorrhea 11/26/2014   Chronic    Hereditary and idiopathic neuropathy 08/19/2013   High cholesterol    History of kidney stones    HPV test positive    Hypercholesteremia 08/19/2013   Hypertension    Hypertension    Hypothyroidism    Incomplete bladder emptying    Microscopic hematuria    MS (multiple sclerosis)    Muscle spasticity 05/21/2014   Nonspecific findings on examination of urine    other   Osteopenia    PONV (postoperative nausea and vomiting)    Prediabetes    Sickle cell trait    Status post laparoscopic supracervical hysterectomy 11/26/2014   Tobacco user 11/26/2014   Wrist fracture    Past Surgical History:  Procedure Laterality Date   bilateral tubal ligation  1996   BREAST CYST EXCISION Left 2002   CYST EXCISION Left 05/10/2022   Procedure: CYST REMOVAL;  Surgeon: Rodolph Romano, MD;  Location: [facility] OR[facility];  Service: General;  Laterality: Left;   FRACTURE SURGERY     KNEE SURGERY     right    LAPAROSCOPIC SUPRACERVICAL HYSTERECTOMY  08/05/2013   ORIF WRIST FRACTURE Left 01/17/2017   Procedure: OPEN REDUCTION INTERNAL FIXATION (ORIF) WRIST FRACTURE;  Surgeon: Leora Lynwood SAUNDERS, MD;  Location: [facility];  Service: Orthopedics;  Laterality: Left;   RADIOLOGY WITH ANESTHESIA N/A 03/18/2020   Procedure: MRI WITH ANESTHESIA CERVICAL SPINE AND BRAIN  WITH AND WITHOUT CONTRAST;  Surgeon: Radiologist, Medication, MD;  Location: [facility] OR;  Service: Radiology;  Laterality: N/A;   right tibia fracture Right    2024   TUBAL LIGATION Bilateral    VAGINAL HYSTERECTOMY  03/2006   VAGINAL HYSTERECTOMY     2014   Patient Active Problem List   Diagnosis Date Noted   SOB (shortness of breath) on exertion 03/12/2024   Hyperglycemia 01/09/2024   Abnormal LFTs (liver function tests) 09/17/2023   Sinus tachycardia 07/07/2023   Aortic atherosclerosis 01/18/2023   Prediabetes 01/18/2023   Hypokalemia 07/29/2022   Weakness of both lower extremities 07/29/2022   Abscess of left groin 11/15/2021   Abnormal LFTs 11/15/2021   Acute respiratory disease due to COVID-19 virus 11/21/2020   Fatigue due to excessive exertion    Hypoalbuminemia due to protein-calorie malnutrition    Neurogenic bowel    Neurogenic bladder    Labile blood  pressure    Neuropathic pain    Multiple sclerosis, relapsing-remitting 07/09/2019   Abscess of female pelvis    SVT (supraventricular tachycardia)    Radial styloid tenosynovitis 03/12/2018   Wheelchair dependence 02/27/2018   Localized osteoporosis with current pathological fracture with routine healing 01/19/2017   Wrist fracture 01/16/2017   Sprain of ankle 03/23/2016   Closed fracture of lateral malleolus 03/16/2016   Depression screening 01/24/2016   Blood pressure elevated without history of HTN 10/25/2015   Essential hypertension 10/25/2015   Multiple sclerosis 10/02/2015   Chronic left shoulder pain 07/19/2015   Multiple sclerosis exacerbation 07/14/2015   MS  (multiple sclerosis) 11/26/2014   Increased body mass index 11/26/2014   HPV test positive 11/26/2014   Status post laparoscopic supracervical hysterectomy 11/26/2014   Galactorrhea 11/26/2014   Back ache 05/21/2014   Adiposity 05/21/2014   Disordered sleep 05/21/2014   Muscle spasticity 05/21/2014   Spasticity 05/21/2014   Calculus of kidney 12/09/2013   Renal colic 12/09/2013   Hypercholesteremia 08/19/2013   Hereditary and idiopathic neuropathy 08/19/2013   Hypercholesterolemia without hypertriglyceridemia 08/19/2013   Bladder infection, chronic 07/25/2012   Disorder of bladder function 07/25/2012   Incomplete bladder emptying 07/25/2012   Microscopic hematuria 07/25/2012   Right upper quadrant pain 07/25/2012    ONSET DATE: 1995  REFERRING DIAG: MS  THERAPY DIAG:  Multiple sclerosis exacerbation  Other lack of coordination  Muscle weakness (generalized)  Difficulty in walking, not elsewhere classified  Unsteadiness on feet  Abnormality of gait and mobility  Other abnormalities of gait and mobility  Rationale for Evaluation and Treatment: Rehabilitation  SUBJECTIVE:                                                                                                                                                                                             SUBJECTIVE STATEMENT:  Pt reports she is doing well and had a wonderful Thanksgiving.  Pt reports that she is fine with not walking today due to it being so cold and rainy outside today.  Pt accompanied by: self  PERTINENT HISTORY:  Patient is returning to PT s/p hospitalization.  s/p ORIF of R tibia shaft fracture 09/23/2022. Patient has weakness in BLE with RLE>LLE. She drives with hand controls. Patient has been diagnosed with MS in 1995. PMH includes: back pain, CBP, chronic L shoulder pain, galactorrhea, neuropathy, HPV, hypercholestermeia, HTN, MS, osteopenia, PONV, wrist fracture. Additional order for other  closed fracture of proximal end of R tibia with routine healing. Still has to use [employer].   PAIN:  Are you having pain? Occasional pain in RLE;  primarily in knee  PRECAUTIONS: Fall  RED FLAGS: None   WEIGHT BEARING RESTRICTIONS: No  FALLS: Has patient fallen in last 6 months? No  LIVING ENVIRONMENT: Lives with: lives with their family Lives in: House/apartment Stairs: ramp Has following equipment at home: Vannie - 2 wheeled, Wheelchair (manual), shower chair, and Grab bars  PLOF: Independent with household mobility with device  PATIENT GOALS: to get her independence back. To be able to get into car, toilet, and get dressed independently  OBJECTIVE:  Note: Objective measures were completed at Evaluation unless otherwise noted.  DIAGNOSTIC FINDINGS: MRI of the brain 03/18/2020 showed multiple T2/FLAIR hyperintense foci in the periventricular, juxtacortical and deep white matter.  There were no infratentorial lesions noted.  None of the foci enhanced.   Due to severe claustrophobia, the study was done with conscious sedation in the hospital   COGNITION: Overall cognitive status: Within functional limits for tasks assessed   SENSATION: Lack of sensation in feet, loss in bilateral lateral aspect of knee  COORDINATION: Does not have the strength for functional LE heel slide test   MUSCLE TONE: BLE mild tone    POSTURE: rounded shoulders, forward head, anterior pelvic tilt, and weight shift left   LOWER EXTREMITY MMT:    MMT Right Eval Left Eval  Hip flexion 0.9 1.5  Hip extension    Hip abduction 1.8 2.6  Hip adduction 3.1 1.9  Hip internal rotation    Hip external rotation    Knee flexion 0.9 1.5  Knee extension 0.8 1.2  Ankle dorsiflexion    Ankle plantarflexion    Ankle inversion    Ankle eversion    (Blank rows = not tested)  BED MOBILITY:  Assess in future session due to limited time  TRANSFERS: Assistive device utilized: Bariatric RW with sit to  stands, slide board to table  Sit to stand: unable to reach full stand Stand to sit: unable to reach full stand  Chair to chair: slide board with CGA   GAIT: Unable to ambulate at this time.   FUNCTIONAL TESTS:  Sit to stand: tricep press with BUE; unable to bring arm to walker.    Function In Sitting Test (FIST)  (1/2 femur on surface; hips/knees flexed to 90deg)   - indicate bed or mat table / step stool if used  SCORING KEY: 4 = Independent (completes task independently & successfully) 3 = Verbal Cues/Increased Time (completes task independently & successfully and only needs more time/cues) 2 = Upper Extremity Support (must use UE for support or assistance to complete successfully) 1 = Needs Assistance (unable to complete w/o physical assist; DOCUMENT LEVEL: min, mod, max) 0 = Dependent (requires complete physical assist; unable to complete successfully even w/ physical assist)  Randomly Administer Once Throughout Exam  4 - Anterior Nudge (superior sternum)  4 - Posterior Nudge (between scapular spines)  4 - Lateral Nudge (to dominant side at acromion)     4 - Static sitting (30 seconds)  4 - Sitting, shake 'no' (left and right)  4 - Sitting, eyes closed (30 seconds)   0 - Sitting, lift foot (dominant side, lift foot 1 inch twice)    2 - Pick up object from behind (object at midline, hands breadth posterior)  3 - Forward reach (use dominant arm, must complete full motion) 2 - Lateral reach (use dominant arm, clear opposite ischial tuberosity) 2 - Pick up object from floor (from between feet)   2 - Posterior scooting (move backwards  2 inches)  2 - Anterior scooting (move forward 2 inches)  2 - Lateral scooting (move to dominant side 2 inches)    TOTAL = 39/56  Notes/comments: slide board tranfer to/from table   MCD > 5 points MCID for IP REHAB > 6 points  Function In Sitting Test (FIST) 08/14/23 (1/2 femur on surface; hips/knees flexed to 90deg)   - indicate bed or  mat table / step stool if used  SCORING KEY: 4 = Independent (completes task independently & successfully) 3 = Verbal Cues/Increased Time (completes task independently & successfully and only needs more time/cues) 2 = Upper Extremity Support (must use UE for support or assistance to complete successfully) 1 = Needs Assistance (unable to complete w/o physical assist; DOCUMENT LEVEL: min, mod, max) 0 = Dependent (requires complete physical assist; unable to complete successfully even w/ physical assist)  Randomly Administer Once Throughout Exam  4 - Anterior Nudge (superior sternum)  4 - Posterior Nudge (between scapular spines)  4 - Lateral Nudge (to dominant side at acromion)     4 - Static sitting (30 seconds)  4 - Sitting, shake 'no' (left and right)  4 - Sitting, eyes closed (30 seconds)   0 - Sitting, lift foot (dominant side, lift foot 1 inch twice)    4 - Pick up object from behind (object at midline, hands breadth posterior)  4 - Forward reach (use dominant arm, must complete full motion) 4 - Lateral reach (use dominant arm, clear opposite ischial tuberosity)  - Pick up object from floor (from between feet)   3 - Posterior scooting (move backwards 2 inches)  3 - Anterior scooting (move forward 2 inches)  3 - Lateral scooting (move to dominant side 2 inches)    TOTAL = 47/56  MCD > 5 points MCID for IP REHAB > 6 points   TREATMENT DATE: 04/08/24   TherEx:  Seated bicep curls, 5#, 2x10 each UE Seated lateral raises with 5# dumbbells, 2x10 each UE Seated overhead press with 5# dumbbells, 2x10 each UE  Seated row with straight bar/cable column, 22.5# each side (55# total), 2x10 Seated chest press with straight bar/cable column, 7.5# each side (15# total), 2x10   TherAct:  STS in // bars with 2 min static standing holds, x2 total attempts and good response to the activity with increased weight shift.  Rest break provided in between sets.      PATIENT  EDUCATION:  Education details: Pt educated throughout session about proper posture and technique with exercises. Improved exercise technique, movement at target joints, use of target muscles after min to mod verbal, visual, tactile cues.  Person educated: Patient Education method: Explanation, Demonstration, Tactile cues, and Verbal cues Education comprehension: verbalized understanding, returned demonstration, verbal cues required, tactile cues required, and needs further education    HOME EXERCISE PROGRAM: Stand 3x/day in stander   GOALS: Goals reviewed with patient? Yes  SHORT TERM GOALS: Target date: 08/08/2023  Patient will be independent in home exercise program to improve strength/mobility for better functional independence with ADLs.  Baseline: 4/8: compliance Goal status: MET    LONG TERM GOALS: Target date: 03/20/2024  Patient will tolerate five consecutive stands with UE support from wheelchair to standing to improve functional mobility with additional cushion and RW.  Baseline: unable to perform 4/8:unable to perform full stand 5/20: perform next session 5/29: two full stands with CGA and cushion on her seat 7/31: able to stand 3 times from [facility]. With no addition cushion  in parallel bars. 12/27/2023= Will retest next session- concentrated on just standing on past 2 visits 03/20/24:  Goal status: Ongoing   2.  Patient will ambulate 10 ft with bRW with wheelchair follow.  Baseline:  unable to ambulate 4/8: unable to ambulate 5/20: able to ambulate length of // bars in previous session 5/27: ambulate 2.5 lengths of // bars with two seated rest breaks with min A for weight shift and wheelchair follow  7/31: able to ambulate in parallel bars 3 ft with min assist and +2 follow in WC> difficulty advancing the LLE on this day. 12/27/2023- Patient unable to take a step forward today but was abel to take a step on previous visit- limited by clonus like trembling that has progressed over  past month. 03/20/24: 26' at one time Goal status: MET   3.  Patient will improve FIST score >6 points to demonstrate improved stability and ability to perform ADLs.  Baseline:  3/6: 39/56 4/8: 47/ 56 5/20: 48/56 7/31:46/56 Goal status: MET  4.  Patient will perform toileting with mod I at home for improved independence.  Baseline: 3/6: requires assistance 4/8: requires dependence on machine 5/20: requires assistance with use of wipe buddy.  7/31: continues to require use of sara steady and wipe buddy for safety and hygiene. 12/27/2023- Patient reports that since she is unable to take steps that she continues to require use of sara steady and wipe buddy for safe personal hygiene. 03/20/24: Pt utilizing sara stedy to perform safe personal hygiene, however is no longer utilizing the wipe buddy. Goal status: PROGRESSING   5. Patient will perform > 5 min of static or dynamic standing with BUE and Min/mod assist  for improved LE strength and pre-gait function to assist with toileting and overall standing ability for improve functional independence.  Baseline: 12/27/2023-Patient was able to stand for just over 3 min today. 01/10/24: Pt able to stand 1 min 38 sec 02/14/24: Pt able to >3 minutes at this time. 03/20/24: Pt able to achieve >4 minutes at this time. Goal status: PROGRESSING  6.  Patient will ambulate 40 ft with BRW with wheelchair follow, in one attempt, without rest break. Baseline: 75' during one attempt. Goal status: NEW   ASSESSMENT:  CLINICAL IMPRESSION:  Pt responded well to the exercises today and was able to stand for a total of 4 minutes altogether with static UE support.  Pt continues to improve overall UE strength necessary for transfers and other mobility factors.  Pt denies any complains upon completion of the exercises.   Pt will continue to benefit from skilled therapy to address remaining deficits in order to improve overall QoL and return to PLOF.          OBJECTIVE IMPAIRMENTS: Abnormal gait, cardiopulmonary status limiting activity, decreased activity tolerance, decreased balance, decreased coordination, decreased endurance, decreased mobility, difficulty walking, decreased ROM, decreased strength, hypomobility, increased fascial restrictions, impaired perceived functional ability, impaired flexibility, impaired sensation, improper body mechanics, and postural dysfunction.   ACTIVITY LIMITATIONS: carrying, lifting, bending, sitting, standing, squatting, sleeping, stairs, transfers, bed mobility, continence, bathing, toileting, dressing, self feeding, reach over head, hygiene/grooming, locomotion level, and caring for others  PARTICIPATION LIMITATIONS: meal prep, cleaning, laundry, interpersonal relationship, driving, shopping, community activity, and church  PERSONAL FACTORS: Age, Past/current experiences, Time since onset of injury/illness/exacerbation, Transportation, and 3+ comorbidities: ack pain, CBP, chronic L shoulder pain, galactorrhea, neuropathy, HPV, hypercholestermeia, HTN, MS, osteopenia, PONV, wrist fracture are also affecting patient's functional outcome.   REHAB  POTENTIAL: Good  CLINICAL DECISION MAKING: Evolving/moderate complexity  EVALUATION COMPLEXITY: Moderate  PLAN:  PT FREQUENCY: 2x/week  PT DURATION: 12 weeks  PLANNED INTERVENTIONS: [id number]- PT Re-evaluation, 97110-Therapeutic exercises, 97530- Therapeutic activity, 97112- Neuromuscular re-education, 97535- Self Care, [id number]- Manual therapy, [phone]- Gait training, [phone]- Orthotic Fit/training, [phone]- Canalith repositioning, [id number]- Aquatic Therapy, [phone]- Electrical stimulation (unattended), [phone]- Electrical stimulation (manual), [id number]- Ultrasound, [id number]- Traction (mechanical), Patient/Family education, Balance training, Stair training, Taping, Dry Needling, Joint mobilization, Joint manipulation, Spinal manipulation, Spinal mobilization, Scar mobilization,  Compression bandaging, Vestibular training, Visual/preceptual remediation/compensation, Cognitive remediation, DME instructions, Cryotherapy, Moist heat, and Biofeedback  PLAN FOR NEXT SESSION:  Strap stand frame with more dynamic activities Sit to stands; try supported walking, continue dynamic core stabilization training and seated reaching tasks to improve weight shifting.    Fonda Simpers, PT, DPT Physical Therapist - [facility]  04/08/24, 1:07 PM

## 2024-04-09 MED ORDER — LEVOTHYROXINE SODIUM 88 MCG PO TABS: 88.0000 ug | ORAL_TABLET | Freq: Every day | ORAL | 1 refills | Status: DC

## 2024-04-09 NOTE — Telephone Encounter (Signed)
 Rx sent in for synthroid  and tsh ordered to be drawn at the hospital.

## 2024-04-10 ENCOUNTER — Ambulatory Visit: Payer: PPO

## 2024-04-10 ENCOUNTER — Ambulatory Visit

## 2024-04-10 DIAGNOSIS — G35D Multiple sclerosis, unspecified: Secondary | ICD-10-CM

## 2024-04-10 DIAGNOSIS — R278 Other lack of coordination: Secondary | ICD-10-CM

## 2024-04-10 DIAGNOSIS — R262 Difficulty in walking, not elsewhere classified: Secondary | ICD-10-CM

## 2024-04-10 DIAGNOSIS — R2681 Unsteadiness on feet: Secondary | ICD-10-CM

## 2024-04-10 DIAGNOSIS — M6281 Muscle weakness (generalized): Secondary | ICD-10-CM

## 2024-04-10 DIAGNOSIS — R2689 Other abnormalities of gait and mobility: Secondary | ICD-10-CM

## 2024-04-10 DIAGNOSIS — R269 Unspecified abnormalities of gait and mobility: Secondary | ICD-10-CM

## 2024-04-10 NOTE — Therapy (Signed)
 OUTPATIENT OCCUPATIONAL THERAPY NEURO PROGRESS AND TREATMENT NOTE Reporting period beginning 02/28/24-04/10/24  Patient Name: Lisa Williams MRN: [id number] DOB:03-Jun-1960, 63 y.o., female Today's Date: 04/10/2024  PCP: Dr. Layman Piety   Neurologist Dr. Suanne at [facility] REFERRING PROVIDER: Dr. Layman Piety    END OF SESSION:  OT End of Session - 04/10/24 1102     Visit Number 60    Number of Visits 67    Date for Recertification  04/29/24    OT Start Time 1100    OT Stop Time 1145    OT Time Calculation (min) 45 min    Equipment Utilized During Treatment manual wc    Activity Tolerance Patient tolerated treatment well    Behavior During Therapy [facility] for tasks assessed/performed         Past Medical History:  Diagnosis Date   Abdominal pain, right upper quadrant    Back pain    Calculus of kidney 12/09/2013   Chronic back pain    unspecified   Chronic left shoulder pain 07/19/2015   Complication of anesthesia    Functional disorder of bladder    other   Galactorrhea 11/26/2014   Chronic    Hereditary and idiopathic neuropathy 08/19/2013   High cholesterol    History of kidney stones    HPV test positive    Hypercholesteremia 08/19/2013   Hypertension    Hypertension    Hypothyroidism    Incomplete bladder emptying    Microscopic hematuria    MS (multiple sclerosis)    Muscle spasticity 05/21/2014   Nonspecific findings on examination of urine    other   Osteopenia    PONV (postoperative nausea and vomiting)    Prediabetes    Sickle cell trait    Status post laparoscopic supracervical hysterectomy 11/26/2014   Tobacco user 11/26/2014   Wrist fracture    Past Surgical History:  Procedure Laterality Date   bilateral tubal ligation  1996   BREAST CYST EXCISION Left 2002   CYST EXCISION Left 05/10/2022   Procedure: CYST REMOVAL;  Surgeon: Rodolph Romano, MD;  Location: [facility] ORS;  Service: General;  Laterality: Left;   FRACTURE  SURGERY     KNEE SURGERY     right   LAPAROSCOPIC SUPRACERVICAL HYSTERECTOMY  08/05/2013   ORIF WRIST FRACTURE Left 01/17/2017   Procedure: OPEN REDUCTION INTERNAL FIXATION (ORIF) WRIST FRACTURE;  Surgeon: Leora Lynwood SAUNDERS, MD;  Location: [facility] ORS;  Service: Orthopedics;  Laterality: Left;   RADIOLOGY WITH ANESTHESIA N/A 03/18/2020   Procedure: MRI WITH ANESTHESIA CERVICAL SPINE AND BRAIN  WITH AND WITHOUT CONTRAST;  Surgeon: Radiologist, Medication, MD;  Location: [facility] OR;  Service: Radiology;  Laterality: N/A;   right tibia fracture Right    2024   TUBAL LIGATION Bilateral    VAGINAL HYSTERECTOMY  03/2006   VAGINAL HYSTERECTOMY     2014   Patient Active Problem List   Diagnosis Date Noted   SOB (shortness of breath) on exertion 03/12/2024   Hyperglycemia 01/09/2024   Abnormal LFTs (liver function tests) 09/17/2023   Sinus tachycardia 07/07/2023   Aortic atherosclerosis 01/18/2023   Prediabetes 01/18/2023   Hypokalemia 07/29/2022   Weakness of both lower extremities 07/29/2022   Abscess of left groin 11/15/2021   Abnormal LFTs 11/15/2021   Acute respiratory disease due to COVID-19 virus 11/21/2020   Fatigue due to excessive exertion    Hypoalbuminemia due to protein-calorie malnutrition    Neurogenic bowel    Neurogenic bladder  Labile blood pressure    Neuropathic pain    Multiple sclerosis, relapsing-remitting 07/09/2019   Abscess of female pelvis    SVT (supraventricular tachycardia)    Radial styloid tenosynovitis 03/12/2018   Wheelchair dependence 02/27/2018   Localized osteoporosis with current pathological fracture with routine healing 01/19/2017   Wrist fracture 01/16/2017   Sprain of ankle 03/23/2016   Closed fracture of lateral malleolus 03/16/2016   Depression screening 01/24/2016   Blood pressure elevated without history of HTN 10/25/2015   Essential hypertension 10/25/2015   Multiple sclerosis 10/02/2015   Chronic left shoulder pain 07/19/2015   Multiple  sclerosis exacerbation 07/14/2015   MS (multiple sclerosis) 11/26/2014   Increased body mass index 11/26/2014   HPV test positive 11/26/2014   Status post laparoscopic supracervical hysterectomy 11/26/2014   Galactorrhea 11/26/2014   Back ache 05/21/2014   Adiposity 05/21/2014   Disordered sleep 05/21/2014   Muscle spasticity 05/21/2014   Spasticity 05/21/2014   Calculus of kidney 12/09/2013   Renal colic 12/09/2013   Hypercholesteremia 08/19/2013   Hereditary and idiopathic neuropathy 08/19/2013   Hypercholesterolemia without hypertriglyceridemia 08/19/2013   Bladder infection, chronic 07/25/2012   Disorder of bladder function 07/25/2012   Incomplete bladder emptying 07/25/2012   Microscopic hematuria 07/25/2012   Right upper quadrant pain 07/25/2012   ONSET DATE: 5/24 Amon and broke R leg which caused beginning of ADL decline; diagnosed with MS in 1995)  REFERRING DIAG: MS  THERAPY DIAG:  No diagnosis found.  Rationale for Evaluation and Treatment: Rehabilitation  SUBJECTIVE:  SUBJECTIVE STATEMENT: Pt reports doing well today.   Pt accompanied by: self  PERTINENT HISTORY: Pt reports increased difficulty with basic self care tasks since breaking her R leg last May.  Since that time, mobility and ADLs have declined, with pt requiring assist from private caregivers and family to manage bathing, toileting, dressing, and functional transfers.  PRECAUTIONS: Fall  WEIGHT BEARING RESTRICTIONS: No  PAIN: 04/10/24: No pain today  Are you having pain? No; occasional pain in R lower back, but not today  FALLS: Has patient fallen in last 6 months? No Last fall was May 17 of 2024  LIVING ENVIRONMENT: Lives with: lives with their family (including mother who has dementia and sister [name])  Lives in: 2 level home but pt resides on main level Stairs: ramp  Has following equipment at home: Wheelchair (manual), Shower bench, and hand held shower shower, 1 grab bar in the shower,  3in1 commode, sit to stand lift (electric), FWW, hospital bed  PLOF: modified indep with ADL/IADLs prior to May of 7975   PATIENT GOALS: Increase independence with basic self care tasks  OBJECTIVE:  Note: Objective measures were completed at Evaluation unless otherwise noted.  HAND DOMINANCE: Right  ADLs:  Overall ADLs: assist provided from paid caregiver and sister Transfers/ambulation related to ADLs: sit to stand lift for all transfers, set up for sliding board in/out of bed  Eating: indep  Grooming: modified indep (wc level in mom's bathroom)  UB Dressing: set up (can't access her bedroom closet)  LB Dressing: able to sit on side of bed to don all LB clothing; assist to hike pants/underwear Toileting: assist with clothing management d/t standing within electric lift Bathing: bed bath with sister helping to wash backside Tub Shower transfers: N/A; pt has walk in shower in mom's bathroom (wc does fit in this bathroom but pt reports inability to transfer from wc and requires arm rests on a bench for a successful transfer attempt from  any DME).  Tub bench is in pt's bathroom, but lift does not fit through bathroom doorway and pt reports inability to transfer up from tub bench (currently unable to manage either transfer)   Equipment: see above  IADLs: Shopping: [employer] for shopping Light housekeeping: modified indep from wc level Meal Prep: modified indep from wc level Community mobility: relies on community transportation or family members Medication management: indep Landscape architect: indep Handwriting: NT; pt denies any [facility] challenges  MOBILITY STATUS: Hx of falls  POSTURE COMMENTS:  Rounded shoulders, forward head, anterior pelvic tilt, and weight shift left   ACTIVITY TOLERANCE: Eval: Activity tolerance: Per PT; currently standing 30-60 sec within Light Gait harness/lift; currently non-ambulatory  01/24/24: Standing at sink: 1 minute; min A and 5 attempts to  achieve fully erect standing position with BUEs supported on sink countertop, OT assist to block R knee to prevent buckling   02/28/24: x4 attempts, achieves 75% upright standing with MIN A to block knees.   UPPER EXTREMITY ROM:  BUEs WFL  UPPER EXTREMITY MMT:     MMT Right eval Left eval  Shoulder flexion 4+ 4  Shoulder abduction 4+ 4  Shoulder adduction    Shoulder extension    Shoulder internal rotation 4+ 4+  Shoulder external rotation 4 4  Middle trapezius    Lower trapezius    Elbow flexion 4+ 4+  Elbow extension 4+ 4+  Wrist flexion 4+ 4+  Wrist extension 4+ 4+  Wrist ulnar deviation    Wrist radial deviation    Wrist pronation    Wrist supination    (Blank rows = not tested)  HAND FUNCTION/COORDINATION:  R/L WNL; pt denies any coordination deficits/ able to manipulate pills/clothing fasteners/etc without difficulty  11/15/23: *Measures taken d/t PT note revealing pt with increased difficulty manipulating smaller ADL supplies during reaching activities in PT session   11/15/23: Grip strength: R: 70 lbs, L: 63 lbs Pinch strength: Lateral: R: 16 lbs, L: 15 lbs; 3 point pinch: R: 12 lbs, L: 13 lbs 9 hole peg test: Right: 25 sec, L 49 sec   12/25/23: Grip strength: Right: 70 lbs; Left: 65 lbs  9 hole peg test: Right: 24 sec; Left: 3 trials: 54 sec, 1 min, 56 sec  01/24/24: Grip grip: Right: 76 lbs; Left: 67 lbs Lateral pinch: R: 17 lbs, L: 17 lbs; 3 point pinch: R: 13 lbs, L: 14 lbs  9 Hole Peg Test: R: 26 sec, L: 50 sec  02/28/24: Grip: Right: 85 lbs; Left: 71 lbs Lateral pinch: R: 20 lbs, L: 19 lbs; 3 point pinch: R: 17 lbs, L: 18 lbs  9 Hole Peg Test: R: 29 sec, L: 40 sec   04/10/24: Grip: Right: 85 lbs; Left: 81 lbs;   Lateral pinch: R: 18 lbs, L: 18 lbs; 3 point pinch: R: 17 lbs, L: 18 lbs   9 hole Peg Test: R: 25 sec, L 32 sec   SENSATION: WFL  EDEMA: No visible edema in BUEs  MUSCLE TONE: BUEs WNL  COGNITION: Overall cognitive status: Within  functional limits for tasks assessed  VISION: wears glasses all the time, no reports of diplopia  PERCEPTION: Not tested  PRAXIS: WFL  OBSERVATIONS:  Pt pleasant, cooperative, and motivated to work towards improving indep with ADLs.  TREATMENT DATE: 04/10/24 Therapeutic Activity: -Objective measures taken and goals updated for progress note.  Self Care: -STS trials from wc, working towards standing pericare for sink bath.  Wc resting against elevated hi/low table, with table adjusted to sink height at home.  Pt sat on 2 wc cushions for easier STS.  Mod A to block R knee and mod vc for forward WS.  OT provided constant CGA once standing, min A when progressing to dynamic standing with pt removing R hand from walker handle to reach to posterior buttocks x3 for simulation of peri care.  -Review of progress towards goals  PATIENT EDUCATION: Education details: Progress towards goals Person educated: Patient Education method: Explanation, Tactile cues, and Verbal cues Education comprehension: verbalized understanding and returned demonstration  HOME EXERCISE PROGRAM: IR A/AAROM to promote shoulder flexibility for peri care/bathing  GOALS: Goals reviewed with patient? Yes  SHORT TERM GOALS: Target date: 12/25/23  Pt will perform bed bath with set up only. Baseline: Eval: Min-mod A for posterior washing; 09/28/23: Min A for posterior washing; 11/13/23: Able to manage bed bath but sister helps with thoroughness, per pt's preference; pt in agreement to trial bath sitting EOB or in wc before next OT session. Goal status: achieved/d/c  2.  Pt will utilize sliding board for wc<>drop arm commode transfer with SBA. Baseline: Eval: Currently using electric lift for transfer to [facility]; 09/28/23: Not yet completed at home but transfers to commode have been easier without sliding board d/t pt implementing a  scoot pivot. Goal status: d/c (pt does not prefer sliding board for commode transfer)  3.  Pt will be indep to perform HEP for maintaining BUE strength for ADLs and functional transfers. Baseline: Eval: HEP not yet initiated; 09/28/23: Pt is working on core stability exercises from wc, cane stretches for bilat shoulder IR, and tricep extension via wc pushups; 11/13/23: indep Goal status: achieved  LONG TERM GOALS: Target date: 04/29/24  Pt will perform sink bath with set up A. (Revised on 11/13/23 from min A to set up) Baseline: Eval: Currently bed bathing d/t inability to stand at sink without electric lift (lift does not fit into pt's bathroom); 09/28/23: still completing bed bath as pt is not yet able to stand; 11/13/23: Not yet attempted; pt encouraged to attempt before next OT session now that bed bath goal has been met; 12/20/23: Pt is now completing sponge bath with min A while seated EOB (caregiver assists with washing peri area d/t no bed rail to aid in R lateral lean); 01/24/24: Performing with set up on EOB, completing lateral weight shifts to wash posterior/peri area with wc in front of pt and bed rail for LUE support and foot of bed for RUE support  Goal status: achieved  2.  Pt will perform wc<>tub bench transfer with min A. Baseline: Eval: Unable; 09/28/23: Performed in OT clinic with min A, but not yet in the home Goal status: d/c (pt prefers sink bath)  3.  Pt will perform squat pivot transfer wc<>BSC with SBA.  Baseline: Eval: Eval: Currently using electric lift for transfer to [facility]; 09/28/23: Not yet completed in clinic with [facility], and using lift for [facility] in the home still; 11/13/23: Still using Camie lift at home and in clinic transfers are all scoot rather than squat pivots d/t LE weakness; 12/20/23: Pt reports that she plans to use her Camie lift until she can ambulate to her [facility] d/t limited space for wc in her desired location for [facility] at  home. Goal status: d/c  4. Pt will perform scoot  transfer wc<>toilet using grab bars with supv to allow toileting in community setting.  Baseline: Eval: Pt limits community outings d/t requiring use of lift for [facility] transfers; 09/28/23: Pt has performed 2 successful scoot pivot  Transfers to toilet in OT clinic with min A, extra time.  Further trials with clothing management on toilet needed, but pt can lower and hike  pants while sititng edge of mat via lateral leaning and min A to maintain core stability; 11/13/23: Pt performs with CGA and extra time; 12/19/23: Pt  performs with close supv on most attempts, occasional CGA; efficiency is improving; 01/24/24: ongoing improvements; able to complete in fewer scoots (3 scoots each way from wc<>toilet) d/t improve engagement of Les and improved anterior weight shift; occasional min guard-supv; pt completes with therapist in clinic, but not yet in other community settings with sister, and not yet voided on toilet in community. 02/28/24: has not trialed in community, completes with SBA; 04/10/24: deferred in recent visits to focus on standing for sink bath, per pt request    Goal status: ongoing  5.  Pt will complete seated clothing management with min A to enable voiding in handicapped stall within community. Baseline: Recert 11/13/23: Pt can transfer to toilet with increased time and effort, but has not yet attempted clothing management or voiding while seated on commode in community setting.  Pt can lower pants to knees while seated in wc, but requires mod A to hike over hips from seated position; 12/20/23: Pt fully lowers panties and shorts while seated on commode with supv, can hike clothing to her her upper thighs, transfers back to wc, and then performs a tricep extension from wc level to allow caregiver to hike clothing remainder of the way for purposes of EC (min A for clothing management component) Goal status: achieved  6.  Pt will tolerate standing x1 min with close supv and BUE support to manage clothing  in prep for toileting. Baseline: Eval: Currently standing in electric lift only, or within parallel bars without lift; 09/28/23: Not yet attempted; 11/13/23: Pt is able to perform static standing with heavy BUE support on parallel bars with PT; 12/19/22: Not attempted recently d/t new intermittent clonus in BLEs; 01/24/24: Tolerated standing 1 min today at sink with min A for ascent, OT assist to block R knee from buckling. 02/28/24: tolerates up to 1 min standing, difficulty achieving stand 2/2 fatigue from PT; 04/10/24: Pt is inconsistent to achieve full standing position, but when able, can tolerate 1-2 min with CGA-min A and RW for support.  Pt was able to remove R hand briefly from walker (~3 sec or less) to touch R buttocks to simulate reach for clothing management and peri care.  Goal status: in progress  7.  Pt will increase bilat grip strength in order to securely grasp walker sufficiently for functional transfers and ambulation.  Baseline: Recert 11/13/23: TBD (PT note read following OT session, indicating pt with difficulty securing items in hand);  11/15/23: Grip strength: R: 70 lbs, L: 63 lbs; 12/25/23: R 76 lbs, L 67 lbs; sufficient for functional transfers/amb  Goal status: achieved  8.  Pt will increase L [facility] skills in order to manipulate small ADL supplies with reduced dropping as indicated by 20 sec or more improvement in 9 hole peg test. Baseline: Recert 11/13/23: 9 hole peg test TBD (PT note read following OT session, indicating pt with difficulty manipulating and securing  items in hands); 11/15/23: Right: 25 sec, L 49 sec; 12/25/23: Right: 24 sec; Left: 3 trials: 54 sec, 1 min, 56 sec; 01/24/24: R: 26 sec, L: 50 sec. 02/28/24: L: 40 sec; 04/10/24: R 25 sec, L 32 sec   Goal status: in progress  ASSESSMENT: CLINICAL IMPRESSION: Pt seen for 60th visit progress update.  Pt continues to make progress with L hand [facility] skills.  Pt is making steady gains with STS trials, working to enable brief standing  at sink for peri care during a sink bath, and for clothing management for toileting tasks.  Pt is inconsistent to achieve full standing position from wc level, but when able, can tolerate 1-2 min with CGA-min A, RW for support, and use of a 2nd wc cushion to ease STS.  Pt continues to require OT assist to block R knee during STS, and mod vc for forward WS.  Pt was able to successfully remove R hand to reach for posterior buttocks and return R hand to walker x2 trials, working towards reaching for peri care and clothing management in standing.  Pt tried a 3rd time but resulted in controlled descent to wc d/t posterior LOB.  Pt will continue to benefit from skilled OT to work towards above noted goals in OT poc, working to maximize indep with daily tasks while reducing burden of care on caregivers.   PERFORMANCE DEFICITS: in functional skills including ADLs, IADLs, strength, pain, flexibility, Gross motor control, mobility, balance, body mechanics, endurance, and decreased knowledge of use of DME, and psychosocial skills including coping strategies, environmental adaptation, habits, and routines and behaviors.   IMPAIRMENTS: are limiting patient from ADLs, IADLs, and social participation.   CO-MORBIDITIES: has co-morbidities such as neuropathy, back pain, obesity, MS, HTN that affects occupational performance. Patient will benefit from skilled OT to address above impairments and improve overall function.  MODIFICATION OR ASSISTANCE TO COMPLETE EVALUATION: No modification of tasks or assist necessary to complete an evaluation.  OT OCCUPATIONAL PROFILE AND HISTORY: Detailed assessment: Review of records and additional review of physical, cognitive, psychosocial history related to current functional performance.  CLINICAL DECISION MAKING: Moderate - several treatment options, min-mod task modification necessary  REHAB POTENTIAL: Good  EVALUATION COMPLEXITY: Moderate  PLAN:  OT FREQUENCY: 2x/week  OT  DURATION: 12 weeks  PLANNED INTERVENTIONS: 97168 OT Re-evaluation, 97535 self care/ADL training, 02889 therapeutic exercise, 97530 therapeutic activity, 97112 neuromuscular re-education, 97140 manual therapy, 97116 gait training, 02989 moist heat, 97010 cryotherapy, balance training, functional mobility training, psychosocial skills training, energy conservation, coping strategies training, patient/family education, and DME and/or AE instructions  RECOMMENDED OTHER SERVICES: None at this time (Pt currently receiving PT services in this clinic)  CONSULTED AND AGREED WITH PLAN OF CARE: Patient  PLAN FOR NEXT SESSION: see above  Inocente Blazing, MS, OTR/L   Inocente MARLA Blazing, OT 04/10/2024, 11:06 AM

## 2024-04-10 NOTE — Therapy (Signed)
 OUTPATIENT PHYSICAL THERAPY NEURO TREATMENT   Patient Name: Lisa Williams MRN: [id number] DOB:[birth year], 63 y.o., female Today's Date: 04/10/2024  PCP: Lenon Na  REFERRING PROVIDER: Lenon Na   END OF SESSION:  PT End of Session - 04/10/24 1202     Visit Number 74    Number of Visits 94    Date for Recertification  06/12/24   corrected   Progress Note Due on Visit 50    PT Start Time 1020    PT Stop Time 1100    PT Time Calculation (min) 40 min    Equipment Utilized During Treatment Gait belt    Activity Tolerance Patient tolerated treatment well;No increased pain    Behavior During Therapy [facility] for tasks assessed/performed          Past Medical History:  Diagnosis Date   Abdominal pain, right upper quadrant    Back pain    Calculus of kidney 12/09/2013   Chronic back pain    unspecified   Chronic left shoulder pain 07/19/2015   Complication of anesthesia    Functional disorder of bladder    other   Galactorrhea 11/26/2014   Chronic    Hereditary and idiopathic neuropathy 08/19/2013   High cholesterol    History of kidney stones    HPV test positive    Hypercholesteremia 08/19/2013   Hypertension    Hypertension    Hypothyroidism    Incomplete bladder emptying    Microscopic hematuria    MS (multiple sclerosis)    Muscle spasticity 05/21/2014   Nonspecific findings on examination of urine    other   Osteopenia    PONV (postoperative nausea and vomiting)    Prediabetes    Sickle cell trait    Status post laparoscopic supracervical hysterectomy 11/26/2014   Tobacco user 11/26/2014   Wrist fracture    Past Surgical History:  Procedure Laterality Date   bilateral tubal ligation  1996   BREAST CYST EXCISION Left 2002   CYST EXCISION Left 05/10/2022   Procedure: CYST REMOVAL;  Surgeon: Rodolph Romano, MD;  Location: [facility];  Service: General;  Laterality: Left;   FRACTURE SURGERY     KNEE SURGERY     right    LAPAROSCOPIC SUPRACERVICAL HYSTERECTOMY  08/05/2013   ORIF WRIST FRACTURE Left 01/17/2017   Procedure: OPEN REDUCTION INTERNAL FIXATION (ORIF) WRIST FRACTURE;  Surgeon: Leora Lynwood SAUNDERS, MD;  Location: [facility];  Service: Orthopedics;  Laterality: Left;   RADIOLOGY WITH ANESTHESIA N/A 03/18/2020   Procedure: MRI WITH ANESTHESIA CERVICAL SPINE AND BRAIN  WITH AND WITHOUT CONTRAST;  Surgeon: Radiologist, Medication, MD;  Location: [facility];  Service: Radiology;  Laterality: N/A;   right tibia fracture Right    2024   TUBAL LIGATION Bilateral    VAGINAL HYSTERECTOMY  03/2006   VAGINAL HYSTERECTOMY     2014   Patient Active Problem List   Diagnosis Date Noted   SOB (shortness of breath) on exertion 03/12/2024   Hyperglycemia 01/09/2024   Abnormal LFTs (liver function tests) 09/17/2023   Sinus tachycardia 07/07/2023   Aortic atherosclerosis 01/18/2023   Prediabetes 01/18/2023   Hypokalemia 07/29/2022   Weakness of both lower extremities 07/29/2022   Abscess of left groin 11/15/2021   Abnormal LFTs 11/15/2021   Acute respiratory disease due to COVID-19 virus 11/21/2020   Fatigue due to excessive exertion    Hypoalbuminemia due to protein-calorie malnutrition    Neurogenic bowel    Neurogenic bladder    Labile  blood pressure    Neuropathic pain    Multiple sclerosis, relapsing-remitting 07/09/2019   Abscess of female pelvis    SVT (supraventricular tachycardia)    Radial styloid tenosynovitis 03/12/2018   Wheelchair dependence 02/27/2018   Localized osteoporosis with current pathological fracture with routine healing 01/19/2017   Wrist fracture 01/16/2017   Sprain of ankle 03/23/2016   Closed fracture of lateral malleolus 03/16/2016   Depression screening 01/24/2016   Blood pressure elevated without history of HTN 10/25/2015   Essential hypertension 10/25/2015   Multiple sclerosis 10/02/2015   Chronic left shoulder pain 07/19/2015   Multiple sclerosis exacerbation 07/14/2015    MS (multiple sclerosis) 11/26/2014   Increased body mass index 11/26/2014   HPV test positive 11/26/2014   Status post laparoscopic supracervical hysterectomy 11/26/2014   Galactorrhea 11/26/2014   Back ache 05/21/2014   Adiposity 05/21/2014   Disordered sleep 05/21/2014   Muscle spasticity 05/21/2014   Spasticity 05/21/2014   Calculus of kidney 12/09/2013   Renal colic 12/09/2013   Hypercholesteremia 08/19/2013   Hereditary and idiopathic neuropathy 08/19/2013   Hypercholesterolemia without hypertriglyceridemia 08/19/2013   Bladder infection, chronic 07/25/2012   Disorder of bladder function 07/25/2012   Incomplete bladder emptying 07/25/2012   Microscopic hematuria 07/25/2012   Right upper quadrant pain 07/25/2012    ONSET DATE: 1995  REFERRING DIAG: MS  THERAPY DIAG:  Muscle weakness (generalized)  Other lack of coordination  Multiple sclerosis exacerbation  Difficulty in walking, not elsewhere classified  Unsteadiness on feet  Abnormality of gait and mobility  Other abnormalities of gait and mobility  Rationale for Evaluation and Treatment: Rehabilitation  SUBJECTIVE:                                                                                                                                                                                             SUBJECTIVE STATEMENT:  Pt reports she is doing well.  Pt has no new complaints and is ready to attempt walking for the first time in a few weeks.   Pt accompanied by: self  PERTINENT HISTORY:  Patient is returning to PT s/p hospitalization.  s/p ORIF of R tibia shaft fracture 09/23/2022. Patient has weakness in BLE with RLE>LLE. She drives with hand controls. Patient has been diagnosed with MS in 1995. PMH includes: back pain, CBP, chronic L shoulder pain, galactorrhea, neuropathy, HPV, hypercholestermeia, HTN, MS, osteopenia, PONV, wrist fracture. Additional order for other closed fracture of proximal end of R  tibia with routine healing. Still has to use [employer].   PAIN:  Are you having pain? Occasional pain in RLE; primarily in knee  PRECAUTIONS: Fall  RED FLAGS: None   WEIGHT BEARING RESTRICTIONS: No  FALLS: Has patient fallen in last 6 months? No  LIVING ENVIRONMENT: Lives with: lives with their family Lives in: House/apartment Stairs: ramp Has following equipment at home: Vannie - 2 wheeled, Wheelchair (manual), shower chair, and Grab bars  PLOF: Independent with household mobility with device  PATIENT GOALS: to get her independence back. To be able to get into car, toilet, and get dressed independently  OBJECTIVE:  Note: Objective measures were completed at Evaluation unless otherwise noted.  DIAGNOSTIC FINDINGS: MRI of the brain 03/18/2020 showed multiple T2/FLAIR hyperintense foci in the periventricular, juxtacortical and deep white matter.  There were no infratentorial lesions noted.  None of the foci enhanced.   Due to severe claustrophobia, the study was done with conscious sedation in the hospital   COGNITION: Overall cognitive status: Within functional limits for tasks assessed   SENSATION: Lack of sensation in feet, loss in bilateral lateral aspect of knee  COORDINATION: Does not have the strength for functional LE heel slide test   MUSCLE TONE: BLE mild tone    POSTURE: rounded shoulders, forward head, anterior pelvic tilt, and weight shift left   LOWER EXTREMITY MMT:    MMT Right Eval Left Eval  Hip flexion 0.9 1.5  Hip extension    Hip abduction 1.8 2.6  Hip adduction 3.1 1.9  Hip internal rotation    Hip external rotation    Knee flexion 0.9 1.5  Knee extension 0.8 1.2  Ankle dorsiflexion    Ankle plantarflexion    Ankle inversion    Ankle eversion    (Blank rows = not tested)  BED MOBILITY:  Assess in future session due to limited time  TRANSFERS: Assistive device utilized: Bariatric RW with sit to stands, slide board to table  Sit to  stand: unable to reach full stand Stand to sit: unable to reach full stand  Chair to chair: slide board with CGA   GAIT: Unable to ambulate at this time.   FUNCTIONAL TESTS:  Sit to stand: tricep press with BUE; unable to bring arm to walker.    Function In Sitting Test (FIST)  (1/2 femur on surface; hips/knees flexed to 90deg)   - indicate bed or mat table / step stool if used  SCORING KEY: 4 = Independent (completes task independently & successfully) 3 = Verbal Cues/Increased Time (completes task independently & successfully and only needs more time/cues) 2 = Upper Extremity Support (must use UE for support or assistance to complete successfully) 1 = Needs Assistance (unable to complete w/o physical assist; DOCUMENT LEVEL: min, mod, max) 0 = Dependent (requires complete physical assist; unable to complete successfully even w/ physical assist)  Randomly Administer Once Throughout Exam  4 - Anterior Nudge (superior sternum)  4 - Posterior Nudge (between scapular spines)  4 - Lateral Nudge (to dominant side at acromion)     4 - Static sitting (30 seconds)  4 - Sitting, shake 'no' (left and right)  4 - Sitting, eyes closed (30 seconds)   0 - Sitting, lift foot (dominant side, lift foot 1 inch twice)    2 - Pick up object from behind (object at midline, hands breadth posterior)  3 - Forward reach (use dominant arm, must complete full motion) 2 - Lateral reach (use dominant arm, clear opposite ischial tuberosity) 2 - Pick up object from floor (from between feet)   2 - Posterior scooting (move backwards 2 inches)  2 -  Anterior scooting (move forward 2 inches)  2 - Lateral scooting (move to dominant side 2 inches)    TOTAL = 39/56  Notes/comments: slide board tranfer to/from table   MCD > 5 points MCID for IP REHAB > 6 points  Function In Sitting Test (FIST) 08/14/23 (1/2 femur on surface; hips/knees flexed to 90deg)   - indicate bed or mat table / step stool if  used  SCORING KEY: 4 = Independent (completes task independently & successfully) 3 = Verbal Cues/Increased Time (completes task independently & successfully and only needs more time/cues) 2 = Upper Extremity Support (must use UE for support or assistance to complete successfully) 1 = Needs Assistance (unable to complete w/o physical assist; DOCUMENT LEVEL: min, mod, max) 0 = Dependent (requires complete physical assist; unable to complete successfully even w/ physical assist)  Randomly Administer Once Throughout Exam  4 - Anterior Nudge (superior sternum)  4 - Posterior Nudge (between scapular spines)  4 - Lateral Nudge (to dominant side at acromion)     4 - Static sitting (30 seconds)  4 - Sitting, shake 'no' (left and right)  4 - Sitting, eyes closed (30 seconds)   0 - Sitting, lift foot (dominant side, lift foot 1 inch twice)    4 - Pick up object from behind (object at midline, hands breadth posterior)  4 - Forward reach (use dominant arm, must complete full motion) 4 - Lateral reach (use dominant arm, clear opposite ischial tuberosity)  - Pick up object from floor (from between feet)   3 - Posterior scooting (move backwards 2 inches)  3 - Anterior scooting (move forward 2 inches)  3 - Lateral scooting (move to dominant side 2 inches)    TOTAL = 47/56  MCD > 5 points MCID for IP REHAB > 6 points   TREATMENT DATE: 04/10/24  =  Gait:   Pt ambulated for a total of 4 attempts, 36' on first attempt, 88' on the second attempt, 56' on the third attempt, 14' on the fourth and final attempt (60' in total).  Pt attempted a 5th time, however pt was having R knee instability and therapist and pt both elected to halt the attempts.  Pt utilized the bariatric FWW, pillow under the thighs for easier ability to come upright, and sock applied to the L foot to reduce friction of the foot in mobility.    Rest breaks given and pt's symptoms monitored throughout the session.             PATIENT EDUCATION:  Education details: Pt educated throughout session about proper posture and technique with exercises. Improved exercise technique, movement at target joints, use of target muscles after min to mod verbal, visual, tactile cues.  Person educated: Patient Education method: Explanation, Demonstration, Tactile cues, and Verbal cues Education comprehension: verbalized understanding, returned demonstration, verbal cues required, tactile cues required, and needs further education    HOME EXERCISE PROGRAM: Stand 3x/day in stander   GOALS: Goals reviewed with patient? Yes  SHORT TERM GOALS: Target date: 08/08/2023  Patient will be independent in home exercise program to improve strength/mobility for better functional independence with ADLs.  Baseline: 4/8: compliance Goal status: MET    LONG TERM GOALS: Target date: 03/20/2024  Patient will tolerate five consecutive stands with UE support from wheelchair to standing to improve functional mobility with additional cushion and RW.  Baseline: unable to perform 4/8:unable to perform full stand 5/20: perform next session 5/29: two full stands with CGA  and cushion on her seat 7/31: able to stand 3 times from [facility]. With no addition cushion  in parallel bars. 12/27/2023= Will retest next session- concentrated on just standing on past 2 visits 03/20/24:  Goal status: Ongoing   2.  Patient will ambulate 10 ft with bRW with wheelchair follow.  Baseline:  unable to ambulate 4/8: unable to ambulate 5/20: able to ambulate length of // bars in previous session 5/27: ambulate 2.5 lengths of // bars with two seated rest breaks with min A for weight shift and wheelchair follow  7/31: able to ambulate in parallel bars 3 ft with min assist and +2 follow in WC> difficulty advancing the LLE on this day. 12/27/2023- Patient unable to take a step forward today but was abel to take a step on previous visit- limited by clonus like trembling that  has progressed over past month. 03/20/24: 26' at one time Goal status: MET   3.  Patient will improve FIST score >6 points to demonstrate improved stability and ability to perform ADLs.  Baseline:  3/6: 39/56 4/8: 47/ 56 5/20: 48/56 7/31:46/56 Goal status: MET  4.  Patient will perform toileting with mod I at home for improved independence.  Baseline: 3/6: requires assistance 4/8: requires dependence on machine 5/20: requires assistance with use of wipe buddy.  7/31: continues to require use of sara steady and wipe buddy for safety and hygiene. 12/27/2023- Patient reports that since she is unable to take steps that she continues to require use of sara steady and wipe buddy for safe personal hygiene. 03/20/24: Pt utilizing sara stedy to perform safe personal hygiene, however is no longer utilizing the wipe buddy. Goal status: PROGRESSING   5. Patient will perform > 5 min of static or dynamic standing with BUE and Min/mod assist  for improved LE strength and pre-gait function to assist with toileting and overall standing ability for improve functional independence.  Baseline: 12/27/2023-Patient was able to stand for just over 3 min today. 01/10/24: Pt able to stand 1 min 38 sec 02/14/24: Pt able to >3 minutes at this time. 03/20/24: Pt able to achieve >4 minutes at this time. Goal status: PROGRESSING  6.  Patient will ambulate 40 ft with BRW with wheelchair follow, in one attempt, without rest break. Baseline: 31' during one attempt. Goal status: NEW   ASSESSMENT:  CLINICAL IMPRESSION:  Pt performed well and was able to ambulate 47' again which has previously matched her maximum in the past.  Pt able to take better steps during the session today, noting some clearance on the L LE which has been missing at times.  Pt ultimately is making steady improvements and will continue to benefit from the exercises given to her.  Pt utilized several rest breaks throughout the session and ultimately  elected to stop at after the final attempt, noting that shw as becoming overheated.  Pt will continue to benefit from skilled therapy to address remaining deficits in order to improve overall QoL and return to PLOF.      OBJECTIVE IMPAIRMENTS: Abnormal gait, cardiopulmonary status limiting activity, decreased activity tolerance, decreased balance, decreased coordination, decreased endurance, decreased mobility, difficulty walking, decreased ROM, decreased strength, hypomobility, increased fascial restrictions, impaired perceived functional ability, impaired flexibility, impaired sensation, improper body mechanics, and postural dysfunction.   ACTIVITY LIMITATIONS: carrying, lifting, bending, sitting, standing, squatting, sleeping, stairs, transfers, bed mobility, continence, bathing, toileting, dressing, self feeding, reach over head, hygiene/grooming, locomotion level, and caring for others  PARTICIPATION LIMITATIONS: meal  prep, cleaning, laundry, interpersonal relationship, driving, shopping, community activity, and church  PERSONAL FACTORS: Age, Past/current experiences, Time since onset of injury/illness/exacerbation, Transportation, and 3+ comorbidities: ack pain, CBP, chronic L shoulder pain, galactorrhea, neuropathy, HPV, hypercholestermeia, HTN, MS, osteopenia, PONV, wrist fracture are also affecting patient's functional outcome.   REHAB POTENTIAL: Good  CLINICAL DECISION MAKING: Evolving/moderate complexity  EVALUATION COMPLEXITY: Moderate  PLAN:  PT FREQUENCY: 2x/week  PT DURATION: 12 weeks  PLANNED INTERVENTIONS: 97164- PT Re-evaluation, 97110-Therapeutic exercises, 97530- Therapeutic activity, 97112- Neuromuscular re-education, 97535- Self Care, [id number]- Manual therapy, [phone]- Gait training, [phone]- Orthotic Fit/training, [phone]- Canalith repositioning, [id number]- Aquatic Therapy, [phone]- Electrical stimulation (unattended), [phone]- Electrical stimulation (manual), [id number]- Ultrasound, [id number]-  Traction (mechanical), Patient/Family education, Balance training, Stair training, Taping, Dry Needling, Joint mobilization, Joint manipulation, Spinal manipulation, Spinal mobilization, Scar mobilization, Compression bandaging, Vestibular training, Visual/preceptual remediation/compensation, Cognitive remediation, DME instructions, Cryotherapy, Moist heat, and Biofeedback  PLAN FOR NEXT SESSION:  Strap stand frame with more dynamic activities Sit to stands; try supported walking, continue dynamic core stabilization training and seated reaching tasks to improve weight shifting.    Fonda Simpers, PT, DPT Physical Therapist - [facility]  04/10/24, 12:08 PM

## 2024-04-15 ENCOUNTER — Ambulatory Visit

## 2024-04-15 ENCOUNTER — Ambulatory Visit: Payer: PPO

## 2024-04-15 DIAGNOSIS — M6281 Muscle weakness (generalized): Secondary | ICD-10-CM

## 2024-04-15 DIAGNOSIS — G35D Multiple sclerosis, unspecified: Secondary | ICD-10-CM

## 2024-04-15 DIAGNOSIS — R269 Unspecified abnormalities of gait and mobility: Secondary | ICD-10-CM

## 2024-04-15 DIAGNOSIS — R262 Difficulty in walking, not elsewhere classified: Secondary | ICD-10-CM

## 2024-04-15 DIAGNOSIS — R2689 Other abnormalities of gait and mobility: Secondary | ICD-10-CM

## 2024-04-15 DIAGNOSIS — R278 Other lack of coordination: Secondary | ICD-10-CM

## 2024-04-15 DIAGNOSIS — R2681 Unsteadiness on feet: Secondary | ICD-10-CM

## 2024-04-15 NOTE — Therapy (Signed)
 OUTPATIENT PHYSICAL THERAPY NEURO TREATMENT   Patient Name: Lisa Williams MRN: [id number] DOB:1961-04-07, 63 y.o., female Today's Date: 04/15/2024  PCP: Lenon Na  REFERRING PROVIDER: Lenon Na   END OF SESSION:  PT End of Session - 04/15/24 1050     Visit Number 75    Number of Visits 94    Date for Recertification  06/12/24   corrected   Progress Note Due on Visit 50    PT Start Time 1017    PT Stop Time 1100    PT Time Calculation (min) 43 min    Equipment Utilized During Treatment Gait belt    Activity Tolerance Patient tolerated treatment well;No increased pain    Behavior During Therapy [facility] for tasks assessed/performed           Past Medical History:  Diagnosis Date   Abdominal pain, right upper quadrant    Back pain    Calculus of kidney 12/09/2013   Chronic back pain    unspecified   Chronic left shoulder pain 07/19/2015   Complication of anesthesia    Functional disorder of bladder    other   Galactorrhea 11/26/2014   Chronic    Hereditary and idiopathic neuropathy 08/19/2013   High cholesterol    History of kidney stones    HPV test positive    Hypercholesteremia 08/19/2013   Hypertension    Hypertension    Hypothyroidism    Incomplete bladder emptying    Microscopic hematuria    MS (multiple sclerosis)    Muscle spasticity 05/21/2014   Nonspecific findings on examination of urine    other   Osteopenia    PONV (postoperative nausea and vomiting)    Prediabetes    Sickle cell trait    Status post laparoscopic supracervical hysterectomy 11/26/2014   Tobacco user 11/26/2014   Wrist fracture    Past Surgical History:  Procedure Laterality Date   bilateral tubal ligation  1996   BREAST CYST EXCISION Left 2002   CYST EXCISION Left 05/10/2022   Procedure: CYST REMOVAL;  Surgeon: Rodolph Romano, MD;  Location: [facility];  Service: General;  Laterality: Left;   FRACTURE SURGERY     KNEE SURGERY      right   LAPAROSCOPIC SUPRACERVICAL HYSTERECTOMY  08/05/2013   ORIF WRIST FRACTURE Left 01/17/2017   Procedure: OPEN REDUCTION INTERNAL FIXATION (ORIF) WRIST FRACTURE;  Surgeon: Leora Lynwood SAUNDERS, MD;  Location: [facility];  Service: Orthopedics;  Laterality: Left;   RADIOLOGY WITH ANESTHESIA N/A 03/18/2020   Procedure: MRI WITH ANESTHESIA CERVICAL SPINE AND BRAIN  WITH AND WITHOUT CONTRAST;  Surgeon: Radiologist, Medication, MD;  Location: [facility] OR;  Service: Radiology;  Laterality: N/A;   right tibia fracture Right    2024   TUBAL LIGATION Bilateral    VAGINAL HYSTERECTOMY  03/2006   VAGINAL HYSTERECTOMY     2014   Patient Active Problem List   Diagnosis Date Noted   SOB (shortness of breath) on exertion 03/12/2024   Hyperglycemia 01/09/2024   Abnormal LFTs (liver function tests) 09/17/2023   Sinus tachycardia 07/07/2023   Aortic atherosclerosis 01/18/2023   Prediabetes 01/18/2023   Hypokalemia 07/29/2022   Weakness of both lower extremities 07/29/2022   Abscess of left groin 11/15/2021   Abnormal LFTs 11/15/2021   Acute respiratory disease due to COVID-19 virus 11/21/2020   Fatigue due to excessive exertion    Hypoalbuminemia due to protein-calorie malnutrition    Neurogenic bowel    Neurogenic bladder  Labile blood pressure    Neuropathic pain    Multiple sclerosis, relapsing-remitting 07/09/2019   Abscess of female pelvis    SVT (supraventricular tachycardia)    Radial styloid tenosynovitis 03/12/2018   Wheelchair dependence 02/27/2018   Localized osteoporosis with current pathological fracture with routine healing 01/19/2017   Wrist fracture 01/16/2017   Sprain of ankle 03/23/2016   Closed fracture of lateral malleolus 03/16/2016   Depression screening 01/24/2016   Blood pressure elevated without history of HTN 10/25/2015   Essential hypertension 10/25/2015   Multiple sclerosis 10/02/2015   Chronic left shoulder pain 07/19/2015   Multiple sclerosis exacerbation  07/14/2015   MS (multiple sclerosis) 11/26/2014   Increased body mass index 11/26/2014   HPV test positive 11/26/2014   Status post laparoscopic supracervical hysterectomy 11/26/2014   Galactorrhea 11/26/2014   Back ache 05/21/2014   Adiposity 05/21/2014   Disordered sleep 05/21/2014   Muscle spasticity 05/21/2014   Spasticity 05/21/2014   Calculus of kidney 12/09/2013   Renal colic 12/09/2013   Hypercholesteremia 08/19/2013   Hereditary and idiopathic neuropathy 08/19/2013   Hypercholesterolemia without hypertriglyceridemia 08/19/2013   Bladder infection, chronic 07/25/2012   Disorder of bladder function 07/25/2012   Incomplete bladder emptying 07/25/2012   Microscopic hematuria 07/25/2012   Right upper quadrant pain 07/25/2012    ONSET DATE: 1995  REFERRING DIAG: MS  THERAPY DIAG:  Muscle weakness (generalized)  Other lack of coordination  Multiple sclerosis exacerbation  Difficulty in walking, not elsewhere classified  Unsteadiness on feet  Abnormality of gait and mobility  Other abnormalities of gait and mobility  Rationale for Evaluation and Treatment: Rehabilitation  SUBJECTIVE:                                                                                                                                                                                             SUBJECTIVE STATEMENT:  Pt reports today may not be her best effort due to it being so cold and it being her normal Tuesday.    Pt accompanied by: self  PERTINENT HISTORY:  Patient is returning to PT s/p hospitalization.  s/p ORIF of R tibia shaft fracture 09/23/2022. Patient has weakness in BLE with RLE>LLE. She drives with hand controls. Patient has been diagnosed with MS in 1995. PMH includes: back pain, CBP, chronic L shoulder pain, galactorrhea, neuropathy, HPV, hypercholestermeia, HTN, MS, osteopenia, PONV, wrist fracture. Additional order for other closed fracture of proximal end of R tibia with  routine healing. Still has to use [employer].   PAIN:  Are you having pain? Occasional pain in RLE; primarily in knee  PRECAUTIONS: Fall  RED FLAGS: None   WEIGHT BEARING RESTRICTIONS: No  FALLS: Has patient fallen in last 6 months? No  LIVING ENVIRONMENT: Lives with: lives with their family Lives in: House/apartment Stairs: ramp Has following equipment at home: Vannie - 2 wheeled, Wheelchair (manual), shower chair, and Grab bars  PLOF: Independent with household mobility with device  PATIENT GOALS: to get her independence back. To be able to get into car, toilet, and get dressed independently  OBJECTIVE:  Note: Objective measures were completed at Evaluation unless otherwise noted.  DIAGNOSTIC FINDINGS: MRI of the brain 03/18/2020 showed multiple T2/FLAIR hyperintense foci in the periventricular, juxtacortical and deep white matter.  There were no infratentorial lesions noted.  None of the foci enhanced.   Due to severe claustrophobia, the study was done with conscious sedation in the hospital   COGNITION: Overall cognitive status: Within functional limits for tasks assessed   SENSATION: Lack of sensation in feet, loss in bilateral lateral aspect of knee  COORDINATION: Does not have the strength for functional LE heel slide test   MUSCLE TONE: BLE mild tone    POSTURE: rounded shoulders, forward head, anterior pelvic tilt, and weight shift left   LOWER EXTREMITY MMT:    MMT Right Eval Left Eval  Hip flexion 0.9 1.5  Hip extension    Hip abduction 1.8 2.6  Hip adduction 3.1 1.9  Hip internal rotation    Hip external rotation    Knee flexion 0.9 1.5  Knee extension 0.8 1.2  Ankle dorsiflexion    Ankle plantarflexion    Ankle inversion    Ankle eversion    (Blank rows = not tested)  BED MOBILITY:  Assess in future session due to limited time  TRANSFERS: Assistive device utilized: Bariatric RW with sit to stands, slide board to table  Sit to stand:  unable to reach full stand Stand to sit: unable to reach full stand  Chair to chair: slide board with CGA   GAIT: Unable to ambulate at this time.   FUNCTIONAL TESTS:  Sit to stand: tricep press with BUE; unable to bring arm to walker.    Function In Sitting Test (FIST)  (1/2 femur on surface; hips/knees flexed to 90deg)   - indicate bed or mat table / step stool if used  SCORING KEY: 4 = Independent (completes task independently & successfully) 3 = Verbal Cues/Increased Time (completes task independently & successfully and only needs more time/cues) 2 = Upper Extremity Support (must use UE for support or assistance to complete successfully) 1 = Needs Assistance (unable to complete w/o physical assist; DOCUMENT LEVEL: min, mod, max) 0 = Dependent (requires complete physical assist; unable to complete successfully even w/ physical assist)  Randomly Administer Once Throughout Exam  4 - Anterior Nudge (superior sternum)  4 - Posterior Nudge (between scapular spines)  4 - Lateral Nudge (to dominant side at acromion)     4 - Static sitting (30 seconds)  4 - Sitting, shake 'no' (left and right)  4 - Sitting, eyes closed (30 seconds)   0 - Sitting, lift foot (dominant side, lift foot 1 inch twice)    2 - Pick up object from behind (object at midline, hands breadth posterior)  3 - Forward reach (use dominant arm, must complete full motion) 2 - Lateral reach (use dominant arm, clear opposite ischial tuberosity) 2 - Pick up object from floor (from between feet)   2 - Posterior scooting (move backwards 2 inches)  2 - Anterior scooting (  move forward 2 inches)  2 - Lateral scooting (move to dominant side 2 inches)    TOTAL = 39/56  Notes/comments: slide board tranfer to/from table   MCD > 5 points MCID for IP REHAB > 6 points  Function In Sitting Test (FIST) 08/14/23 (1/2 femur on surface; hips/knees flexed to 90deg)   - indicate bed or mat table / step stool if used  SCORING  KEY: 4 = Independent (completes task independently & successfully) 3 = Verbal Cues/Increased Time (completes task independently & successfully and only needs more time/cues) 2 = Upper Extremity Support (must use UE for support or assistance to complete successfully) 1 = Needs Assistance (unable to complete w/o physical assist; DOCUMENT LEVEL: min, mod, max) 0 = Dependent (requires complete physical assist; unable to complete successfully even w/ physical assist)  Randomly Administer Once Throughout Exam  4 - Anterior Nudge (superior sternum)  4 - Posterior Nudge (between scapular spines)  4 - Lateral Nudge (to dominant side at acromion)     4 - Static sitting (30 seconds)  4 - Sitting, shake 'no' (left and right)  4 - Sitting, eyes closed (30 seconds)   0 - Sitting, lift foot (dominant side, lift foot 1 inch twice)    4 - Pick up object from behind (object at midline, hands breadth posterior)  4 - Forward reach (use dominant arm, must complete full motion) 4 - Lateral reach (use dominant arm, clear opposite ischial tuberosity)  - Pick up object from floor (from between feet)   3 - Posterior scooting (move backwards 2 inches)  3 - Anterior scooting (move forward 2 inches)  3 - Lateral scooting (move to dominant side 2 inches)    TOTAL = 47/56  MCD > 5 points MCID for IP REHAB > 6 points   TREATMENT DATE: 04/15/24    Gait:   Pt ambulated for a total of 2 attempts, 2' on first attempt, 20' on the second attempt (22' in total).  Pt attempted a 3rd time, however pt was having R knee instability and therapist and pt both elected to halt the attempt.  Pt utilized the bariatric FWW, pillow under the thighs for easier ability to come upright, and sock applied to the L foot to reduce friction of the foot in mobility.    Rest breaks given and pt's symptoms monitored throughout the session.      TherEx: To improve strength, endurance, mobility, and function of specific targeted muscle  groups or improve joint range of motion or improve muscle flexibility  Seated cable column tricep extensions, 12.5#, 2x15 Seated cable column shoulder retraction, 17.5#, 2x15 Seated lift offs for increased GHJ strengthening, x5   PATIENT EDUCATION:  Education details: Pt educated throughout session about proper posture and technique with exercises. Improved exercise technique, movement at target joints, use of target muscles after min to mod verbal, visual, tactile cues.  Person educated: Patient Education method: Explanation, Demonstration, Tactile cues, and Verbal cues Education comprehension: verbalized understanding, returned demonstration, verbal cues required, tactile cues required, and needs further education    HOME EXERCISE PROGRAM: Stand 3x/day in stander   GOALS: Goals reviewed with patient? Yes  SHORT TERM GOALS: Target date: 08/08/2023  Patient will be independent in home exercise program to improve strength/mobility for better functional independence with ADLs.  Baseline: 4/8: compliance Goal status: MET    LONG TERM GOALS: Target date: 03/20/2024  Patient will tolerate five consecutive stands with UE support from wheelchair to standing to  improve functional mobility with additional cushion and RW.  Baseline: unable to perform 4/8:unable to perform full stand 5/20: perform next session 5/29: two full stands with CGA and cushion on her seat 7/31: able to stand 3 times from [facility] With no addition cushion  in parallel bars. 12/27/2023= Will retest next session- concentrated on just standing on past 2 visits 03/20/24:  Goal status: Ongoing   2.  Patient will ambulate 10 ft with bRW with wheelchair follow.  Baseline:  unable to ambulate 4/8: unable to ambulate 5/20: able to ambulate length of // bars in previous session 5/27: ambulate 2.5 lengths of // bars with two seated rest breaks with min A for weight shift and wheelchair follow  7/31: able to ambulate in parallel  bars 3 ft with min assist and +2 follow in WC> difficulty advancing the LLE on this day. 12/27/2023- Patient unable to take a step forward today but was abel to take a step on previous visit- limited by clonus like trembling that has progressed over past month. 03/20/24: 26' at one time Goal status: MET   3.  Patient will improve FIST score >6 points to demonstrate improved stability and ability to perform ADLs.  Baseline:  3/6: 39/56 4/8: 47/ 56 5/20: 48/56 7/31:46/56 Goal status: MET  4.  Patient will perform toileting with mod I at home for improved independence.  Baseline: 3/6: requires assistance 4/8: requires dependence on machine 5/20: requires assistance with use of wipe buddy.  7/31: continues to require use of sara steady and wipe buddy for safety and hygiene. 12/27/2023- Patient reports that since she is unable to take steps that she continues to require use of sara steady and wipe buddy for safe personal hygiene. 03/20/24: Pt utilizing sara stedy to perform safe personal hygiene, however is no longer utilizing the wipe buddy. Goal status: PROGRESSING   5. Patient will perform > 5 min of static or dynamic standing with BUE and Min/mod assist  for improved LE strength and pre-gait function to assist with toileting and overall standing ability for improve functional independence.  Baseline: 12/27/2023-Patient was able to stand for just over 3 min today. 01/10/24: Pt able to stand 1 min 38 sec 02/14/24: Pt able to >3 minutes at this time. 03/20/24: Pt able to achieve >4 minutes at this time. Goal status: PROGRESSING  6.  Patient will ambulate 40 ft with BRW with wheelchair follow, in one attempt, without rest break. Baseline: 74' during one attempt. Goal status: NEW   ASSESSMENT:  CLINICAL IMPRESSION:  Pt responded well to the exercises and was able to ambulate a shorter distance, but transferred well throughout the session.  Pt still making progress towards walking and transfers  without difficulty.  Pt advised that she would likely do better on Thursday and would be ready to go.   Pt will continue to benefit from skilled therapy to address remaining deficits in order to improve overall QoL and return to PLOF.       OBJECTIVE IMPAIRMENTS: Abnormal gait, cardiopulmonary status limiting activity, decreased activity tolerance, decreased balance, decreased coordination, decreased endurance, decreased mobility, difficulty walking, decreased ROM, decreased strength, hypomobility, increased fascial restrictions, impaired perceived functional ability, impaired flexibility, impaired sensation, improper body mechanics, and postural dysfunction.   ACTIVITY LIMITATIONS: carrying, lifting, bending, sitting, standing, squatting, sleeping, stairs, transfers, bed mobility, continence, bathing, toileting, dressing, self feeding, reach over head, hygiene/grooming, locomotion level, and caring for others  PARTICIPATION LIMITATIONS: meal prep, cleaning, laundry, interpersonal relationship, driving, shopping, community  activity, and church  PERSONAL FACTORS: Age, Past/current experiences, Time since onset of injury/illness/exacerbation, Transportation, and 3+ comorbidities: ack pain, CBP, chronic L shoulder pain, galactorrhea, neuropathy, HPV, hypercholestermeia, HTN, MS, osteopenia, PONV, wrist fracture are also affecting patient's functional outcome.   REHAB POTENTIAL: Good  CLINICAL DECISION MAKING: Evolving/moderate complexity  EVALUATION COMPLEXITY: Moderate  PLAN:  PT FREQUENCY: 2x/week  PT DURATION: 12 weeks  PLANNED INTERVENTIONS: 97164- PT Re-evaluation, 97110-Therapeutic exercises, 97530- Therapeutic activity, 97112- Neuromuscular re-education, 97535- Self Care, 02859- Manual therapy, [phone]- Gait training, [phone]- Orthotic Fit/training, [phone]- Canalith repositioning, [id number]- Aquatic Therapy, [phone]- Electrical stimulation (unattended), [phone]- Electrical stimulation (manual),  [id number]- Ultrasound, [id number]- Traction (mechanical), Patient/Family education, Balance training, Stair training, Taping, Dry Needling, Joint mobilization, Joint manipulation, Spinal manipulation, Spinal mobilization, Scar mobilization, Compression bandaging, Vestibular training, Visual/preceptual remediation/compensation, Cognitive remediation, DME instructions, Cryotherapy, Moist heat, and Biofeedback  PLAN FOR NEXT SESSION:  Strap stand frame with more dynamic activities Sit to stands; continue supported walking, continue dynamic core stabilization training and seated reaching tasks to improve weight shifting.    Fonda Simpers, PT, DPT Physical Therapist - [facility]  04/15/24, 11:04 AM

## 2024-04-16 ENCOUNTER — Encounter: Payer: Self-pay | Admitting: Neurology

## 2024-04-16 ENCOUNTER — Ambulatory Visit: Admitting: Neurology

## 2024-04-16 DIAGNOSIS — R3915 Urgency of urination: Secondary | ICD-10-CM

## 2024-04-16 DIAGNOSIS — R269 Unspecified abnormalities of gait and mobility: Secondary | ICD-10-CM

## 2024-04-16 DIAGNOSIS — G8111 Spastic hemiplegia affecting right dominant side: Secondary | ICD-10-CM | POA: Diagnosis not present

## 2024-04-16 DIAGNOSIS — G35C1 Active secondary progressive multiple sclerosis: Secondary | ICD-10-CM

## 2024-04-16 DIAGNOSIS — Z79899 Other long term (current) drug therapy: Secondary | ICD-10-CM

## 2024-04-16 MED ORDER — TIZANIDINE HCL 4 MG PO TABS: ORAL_TABLET | ORAL | 4 refills | Status: AC

## 2024-04-16 MED ORDER — BACLOFEN 20 MG PO TABS: 20.0000 mg | ORAL_TABLET | Freq: Four times a day (QID) | ORAL | 3 refills | Status: AC

## 2024-04-16 NOTE — Progress Notes (Signed)
 [facility]  PATIENT: Lisa Williams DOB: 06-16-1960  REFERRING DOCTOR OR PCP: Layman Piety, MD (PCP) SOURCE: Patient, notes from Dr. Jenel, imaging and lab reports, MRI images personally reviewed and summarized below.  _________________________________   HISTORICAL  CHIEF COMPLAINT:  Chief Complaint  Patient presents with   Multiple Sclerosis    Rm11, alone, Ms follow up     HISTORY OF PRESENT ILLNESS:  Lisa Williams is a 63 y.o. woman with multiple sclerosis.   She is on Mayzent  and tolerates it well.  Update 04/16/2024 She did Mavenclad in May/June 2025.   She had been on Mayzent .  Labs were fine 01/2024 with normal lymphocyte count and normal LFT  She feels about the same as her last visit.   She is noting more clonus in general - but it goes away when she straightens her leg.   She has spasticty, right > left.   She is on baclofen  20 mg po tid and tizandien 4 mg po tid.     She feels stable for the most part.    She can go about 20-30 feet with a walker but cannot take any steps without it. Once walked 60 feet in therapy.   She has weakness in both legs, R worse than L.   Her arms are strong.   She drives with hand controls x 10 years.     She has also lost 12 pounds on phentermine  but she had trouble sleeping so stopped.  Her insurance will not cover Wegovy but will do cash pay if proce goes down.   [name]   LFT's were elevated early last year but are fine last 3 checks.     She has muscle spasms on the right, mostly in the leg.  Tizanidine  and baclofen  have helped. She takes 4 mg po tid  .She opted not to try dantrolene .  She denies sensory symptoms now but had some   She has urinary urgency helped by oxybutynin .   No recent UTI.  Vision is doing well.  She has never had neuritis.     She has fatigue, worse with heat.  She sleeps well most nights.   Mood and cognition are fine.     More stress with mother with dementia is living with  her.     MS History: She was diagnosed in 1995 after presenting with right leg weakness and foot drop.   One day, she lost all sensation in her legs.   She saw Dr. Gretta in [place] until his retirement.   She then saw Dr. Lane in [place] and she started to see Dr. Jenel 2 years ago.     She was initially placed on Copaxone x 3 years and then was off all DMTs for many years and then started Rituxan  around 2018 to 2019.  At that time, associated with a UTI, she felt much more weak prompting her to go on a medication.   She started Mayzent  since 2020.   She has tolerated it well.      IMAGING: MRI of the brain 03/18/2020 showed multiple T2/FLAIR hyperintense foci in the periventricular, juxtacortical and deep white matter.  There were no infratentorial lesions noted.  None of the foci enhanced.   Due to severe claustrophobia, the study was done with conscious sedation in the hospital.    MRI of the cervical spine 03/18/2020 showed T2 hyperintense foci at C1-C2, C2-C3, C4-C5, C5-C6 and T1.  There is a disc protrusion  at C5-C6 to the left and a larger disc protrusion at C6-C7 to the right that distorts the thecal sac.  The adjacent spinal cord has normal signal.  There could be compression of the right C7 nerve root.     REVIEW OF SYSTEMS: Constitutional: No fevers, chills, sweats, or change in appetite Eyes: No visual changes, double vision, eye pain Ear, nose and throat: No hearing loss, ear pain, nasal congestion, sore throat Cardiovascular: No chest pain, palpitations Respiratory:  No shortness of breath at rest or with exertion.   No wheezes GastrointestinaI: No nausea, vomiting, diarrhea, abdominal pain, fecal incontinence Genitourinary:  No dysuria, urinary retention or frequency.  No nocturia. Musculoskeletal:  No neck pain, back pain Integumentary: No rash, pruritus, skin lesions Neurological: as above Psychiatric: No depression at this time.  No anxiety Endocrine: No  palpitations, diaphoresis, change in appetite, change in weigh or increased thirst Hematologic/Lymphatic:  No anemia, purpura, petechiae. Allergic/Immunologic: No itchy/runny eyes, nasal congestion, recent allergic reactions, rashes  ALLERGIES: Allergies  Allergen Reactions   Amlodipine  Swelling   Betadine [Povidone-Iodine]    Povidone Iodine Rash    HOME MEDICATIONS:  Current Outpatient Medications:    acetaminophen  (TYLENOL ) 500 MG tablet, Take 500-1,000 mg by mouth every 6 (six) hours as needed for moderate pain or headache., Disp: , Rfl:    Cholecalciferol  25 MCG (1000 UT) tablet, Take 5,000 Units by mouth daily., Disp: , Rfl:    levothyroxine  (SYNTHROID ) 88 MCG tablet, Take 1 tablet (88 mcg total) by mouth daily., Disp: 90 tablet, Rfl: 1   MAVENCLAD, 10 TABS, 10 MG TBPK, Take by mouth., Disp: , Rfl:    meloxicam  (MOBIC ) 15 MG tablet, Take 1 tablet (15 mg total) by mouth daily., Disp: 30 tablet, Rfl: 0   oxybutynin  (DITROPAN -XL) 10 MG 24 hr tablet, Take 1 tablet (10 mg total) by mouth daily., Disp: 90 tablet, Rfl: 1   rosuvastatin (CRESTOR) 10 MG tablet, Take 10 mg by mouth at bedtime., Disp: , Rfl:    telmisartan  (MICARDIS ) 40 MG tablet, Take 1 tablet (40 mg total) by mouth daily., Disp: 90 tablet, Rfl: 1   baclofen  (LIORESAL ) 20 MG tablet, Take 1 tablet (20 mg total) by mouth 4 (four) times daily., Disp: 360 each, Rfl: 3   tiZANidine  (ZANAFLEX ) 4 MG tablet, One po bid, Disp: 180 tablet, Rfl: 4  PAST MEDICAL HISTORY: Past Medical History:  Diagnosis Date   Abdominal pain, right upper quadrant    Back pain    Calculus of kidney 12/09/2013   Chronic back pain    unspecified   Chronic left shoulder pain 07/19/2015   Complication of anesthesia    Functional disorder of bladder    other   Galactorrhea 11/26/2014   Chronic    Hereditary and idiopathic neuropathy 08/19/2013   High cholesterol    History of kidney stones    HPV test positive    Hypercholesteremia 08/19/2013    Hypertension    Hypertension    Hypothyroidism    Incomplete bladder emptying    Microscopic hematuria    MS (multiple sclerosis)    Muscle spasticity 05/21/2014   Nonspecific findings on examination of urine    other   Osteopenia    PONV (postoperative nausea and vomiting)    Prediabetes    Sickle cell trait    Status post laparoscopic supracervical hysterectomy 11/26/2014   Tobacco user 11/26/2014   Wrist fracture     PAST SURGICAL HISTORY: Past Surgical History:  Procedure  Laterality Date   bilateral tubal ligation  1996   BREAST CYST EXCISION Left 2002   CYST EXCISION Left 05/10/2022   Procedure: CYST REMOVAL;  Surgeon: Rodolph Romano, MD;  Location: [facility];  Service: General;  Laterality: Left;   FRACTURE SURGERY     KNEE SURGERY     right   LAPAROSCOPIC SUPRACERVICAL HYSTERECTOMY  08/05/2013   ORIF WRIST FRACTURE Left 01/17/2017   Procedure: OPEN REDUCTION INTERNAL FIXATION (ORIF) WRIST FRACTURE;  Surgeon: Leora Lynwood SAUNDERS, MD;  Location: [facility];  Service: Orthopedics;  Laterality: Left;   RADIOLOGY WITH ANESTHESIA N/A 03/18/2020   Procedure: MRI WITH ANESTHESIA CERVICAL SPINE AND BRAIN  WITH AND WITHOUT CONTRAST;  Surgeon: Radiologist, Medication, MD;  Location: [facility] OR;  Service: Radiology;  Laterality: N/A;   right tibia fracture Right    2024   TUBAL LIGATION Bilateral    VAGINAL HYSTERECTOMY  03/2006   VAGINAL HYSTERECTOMY     2014    FAMILY HISTORY: Family History  Problem Relation Age of Onset   Heart disease Mother    Osteoarthritis Mother    Hypertension Mother    Hyperlipidemia Mother    Diabetes Father    Heart disease Father    Cancer Father    Hyperlipidemia Father    Nephrolithiasis Father    Hypertension Father    Prostate cancer Father    Kidney disease Father    Heart failure Father    Sickle cell trait Sister    Hypertension Sister    Supraventricular tachycardia Sister    Hypertension Sister    Hypertension Sister     Diabetes Maternal Aunt    Breast cancer Maternal Aunt        great MAT   Diabetes Maternal Grandmother    Liver cancer Maternal Grandmother    Ovarian cancer Paternal Grandmother    Multiple sclerosis Cousin    GU problems Neg Hx    Urolithiasis Neg Hx     SOCIAL HISTORY:  Social History   Socioeconomic History   Marital status: Divorced    Spouse name: Not on file   Number of children: 0   Years of education: 14   Highest education level: Not on file  Occupational History   Occupation: retired  Tobacco Use   Smoking status: Former    Current packs/day: 0.50    Types: Cigarettes   Smokeless tobacco: Never  Vaping Use   Vaping status: Never Used  Substance and Sexual Activity   Alcohol use: No    Alcohol/week: 0.0 standard drinks of alcohol   Drug use: No   Sexual activity: Not Currently    Birth control/protection: Surgical  Other Topics Concern   Not on file  Social History Narrative   Lives at home with mom and sister,. Has a walker for ambulation/uses WC when necessary   Right Handed   Drinks no caffeine   Pt not working    Social Drivers of Corporate Investment Banker Strain: Low Risk  (02/19/2024)   Overall Financial Resource Strain (CARDIA)    Difficulty of Paying Living Expenses: Not hard at all  Food Insecurity: No Food Insecurity (02/19/2024)   Hunger Vital Sign    Worried About Running Out of Food in the Last Year: Never true    Ran Out of Food in the Last Year: Never true  Transportation Needs: No Transportation Needs (02/19/2024)   PRAPARE - Administrator, Civil Service (Medical): No  Lack of Transportation (Non-Medical): No  Physical Activity: Inactive (02/19/2024)   Exercise Vital Sign    Days of Exercise per Week: 0 days    Minutes of Exercise per Session: 0 min  Stress: No Stress Concern Present (02/19/2024)   [employer] of Occupational Health - Occupational Stress Questionnaire    Feeling of Stress: Not at all   Social Connections: Socially Isolated (02/19/2024)   Social Connection and Isolation Panel    Frequency of Communication with Friends and Family: More than three times a week    Frequency of Social Gatherings with Friends and Family: More than three times a week    Attends Religious Services: Never    Database Administrator or Organizations: No    Attends Banker Meetings: Never    Marital Status: Divorced  Catering Manager Violence: Not At Risk (02/19/2024)   Humiliation, Afraid, Rape, and Kick questionnaire    Fear of Current or Ex-Partner: No    Emotionally Abused: No    Physically Abused: No    Sexually Abused: No     PHYSICAL EXAM  Vitals:   04/16/24 1347  BP: 129/79  Pulse: (!) 57  Resp: 17  SpO2: 97%     There is no height or weight on file to calculate BMI.   General: The patient is well-developed and well-nourished and in no acute distress  HEENT:  Head is [place]/AT.  Sclera are anicteric.    Skin: Extremities are without rash or  edema.  Musculoskeletal:  Back is nontender  Neurologic Exam  Mental status: The patient is alert and oriented x 3 at the time of the examination. The patient has apparent normal recent and remote memory, with an apparently normal attention span and concentration ability.   Speech is normal.  Cranial nerves: Extraocular movements are full. Facial strength and sensation are normal.  No obvious hearing deficits are noted.  Motor:  Muscle bulk is normal.   Tone is increased in the legs, right slightly more than left but better than last visit.  Strength was 2-/5 in the hip flexors and 2+ right and 3 left/5 in the knee extensors,  Knee flexors were 4-/5, the distal legs are 3-4/5.  Strength was 5/5 in the arms except 4+/5 left ulnar innervated hand muscles  Sensory: Sensory testing showed mildly reduced touch and vibration sensation in the right compared to the left..  Coordination: Cerebellar testing reveals good  finger-nose-finger.  Cannot do Heel-to-shin due to strengtth .  Gait and station: Due to weakness, gait not tested  Reflexes: Deep tendon reflexes are symmetric and normal in arms and increased in legs.  She has right ankle clonus  DIAGNOSTIC DATA (LABS, IMAGING, TESTING) - I reviewed patient records, labs, notes, testing and imaging myself where available.  Lab Results  Component Value Date   WBC 6.2 01/09/2024   HGB 12.5 01/09/2024   HCT 38.0 01/09/2024   MCV 80.8 01/09/2024   PLT 216.0 01/09/2024      Component Value Date/Time   NA 142 03/12/2024 1056   NA 139 02/15/2013 1700   K 4.5 03/12/2024 1056   K 3.8 02/15/2013 1700   CL 106 03/12/2024 1056   CL 109 (H) 02/15/2013 1700   CO2 23 03/12/2024 1056   CO2 25 02/15/2013 1700   GLUCOSE 80 03/12/2024 1056   GLUCOSE 82 01/09/2024 1124   GLUCOSE 94 02/15/2013 1700   BUN 22 03/12/2024 1056   BUN 20 (H) 02/15/2013 1700  CREATININE 0.66 03/12/2024 1056   CREATININE 0.59 (L) 02/15/2013 1700   CALCIUM  9.6 03/12/2024 1056   CALCIUM  9.0 02/15/2013 1700   PROT 7.2 03/12/2024 1056   PROT 7.2 02/15/2013 1700   ALBUMIN 4.4 03/12/2024 1056   ALBUMIN 3.6 02/15/2013 1700   AST 14 03/12/2024 1056   AST 20 02/15/2013 1700   ALT 12 03/12/2024 1056   ALT 15 02/15/2013 1700   ALKPHOS 158 (H) 03/12/2024 1056   ALKPHOS 127 02/15/2013 1700   BILITOT 0.5 03/12/2024 1056   BILITOT 0.3 02/15/2013 1700   GFRNONAA >60 07/08/2023 0620   GFRNONAA >60 02/15/2013 1700   GFRAA 110 05/13/2020 1146   GFRAA >60 02/15/2013 1700   Lab Results  Component Value Date   CHOL 136 01/09/2024   HDL 43.20 01/09/2024   LDLCALC 78 01/09/2024   TRIG 73.0 01/09/2024   CHOLHDL 3 01/09/2024   Lab Results  Component Value Date   HGBA1C 6.1 01/09/2024   Lab Results  Component Value Date   VITAMINB12 461 03/12/2024   Lab Results  Component Value Date   TSH 5.100 (H) 04/08/2024       ASSESSMENT AND PLAN  Active secondary progressive multiple  sclerosis  Spastic hemiplegia of right dominant side due to noncerebrovascular etiology (HCC)  High risk medication use  Gait disturbance  Urinary urgency    She tolerated the Mavenclad well.  She will do the second year of Mavenclad around May and June 2026.  When she comes back to see us  in 5 months we will check blood work for chronic infections   Continue PT to help with recovery from recent fracture/deconditioning.     Continue baclofen  to 20 mg 4 times a day.  Additionally she takes tizanidine  twice a day.  I will send in renewal prescriptions.    Follow-up in 6 months or sooner if there are new or worsening neurologic symptoms.   This visit is part of a comprehensive longitudinal care medical relationship regarding the patients primary diagnosis of MS and related concerns.   Pepper Wyndham A. Vear, MD, [facility] 04/16/2024, 3:14 PM Certified in Neurology, Clinical Neurophysiology, Sleep Medicine and Neuroimaging  [facility] [address] [place], [place] [address] [phone]

## 2024-04-17 ENCOUNTER — Ambulatory Visit: Payer: PPO

## 2024-04-17 ENCOUNTER — Ambulatory Visit

## 2024-04-17 DIAGNOSIS — R278 Other lack of coordination: Secondary | ICD-10-CM

## 2024-04-17 DIAGNOSIS — G35D Multiple sclerosis, unspecified: Secondary | ICD-10-CM

## 2024-04-17 DIAGNOSIS — M6281 Muscle weakness (generalized): Secondary | ICD-10-CM

## 2024-04-17 DIAGNOSIS — R2681 Unsteadiness on feet: Secondary | ICD-10-CM

## 2024-04-17 DIAGNOSIS — R269 Unspecified abnormalities of gait and mobility: Secondary | ICD-10-CM

## 2024-04-17 DIAGNOSIS — R2689 Other abnormalities of gait and mobility: Secondary | ICD-10-CM

## 2024-04-17 DIAGNOSIS — R262 Difficulty in walking, not elsewhere classified: Secondary | ICD-10-CM

## 2024-04-17 NOTE — Therapy (Signed)
 OUTPATIENT OCCUPATIONAL THERAPY NEURO TREATMENT NOTE  Patient Name: Tameah Mihalko Salido MRN: [id number] DOB:Oct 15, 1960, 63 y.o., female Today's Date: 04/17/2024  PCP: Dr. Layman Piety   Neurologist Dr. Suanne at [facility] REFERRING PROVIDER: Dr. Layman Piety    END OF SESSION:  OT End of Session - 04/17/24 1029     Visit Number 61    Number of Visits 67    Date for Recertification  04/29/24    Authorization Time Period Reporting period beginning 04/10/24    Progress Note Due on Visit 70    OT Start Time 1100    OT Stop Time 1140    OT Time Calculation (min) 40 min    Equipment Utilized During Treatment manual wc    Activity Tolerance Patient tolerated treatment well    Behavior During Therapy [facility] for tasks assessed/performed         Past Medical History:  Diagnosis Date   Abdominal pain, right upper quadrant    Back pain    Calculus of kidney 12/09/2013   Chronic back pain    unspecified   Chronic left shoulder pain 07/19/2015   Complication of anesthesia    Functional disorder of bladder    other   Galactorrhea 11/26/2014   Chronic    Hereditary and idiopathic neuropathy 08/19/2013   High cholesterol    History of kidney stones    HPV test positive    Hypercholesteremia 08/19/2013   Hypertension    Hypertension    Hypothyroidism    Incomplete bladder emptying    Microscopic hematuria    MS (multiple sclerosis)    Muscle spasticity 05/21/2014   Nonspecific findings on examination of urine    other   Osteopenia    PONV (postoperative nausea and vomiting)    Prediabetes    Sickle cell trait    Status post laparoscopic supracervical hysterectomy 11/26/2014   Tobacco user 11/26/2014   Wrist fracture    Past Surgical History:  Procedure Laterality Date   bilateral tubal ligation  1996   BREAST CYST EXCISION Left 2002   CYST EXCISION Left 05/10/2022   Procedure: CYST REMOVAL;  Surgeon: Rodolph Romano, MD;  Location: [facility] ORS;  Service:  General;  Laterality: Left;   FRACTURE SURGERY     KNEE SURGERY     right   LAPAROSCOPIC SUPRACERVICAL HYSTERECTOMY  08/05/2013   ORIF WRIST FRACTURE Left 01/17/2017   Procedure: OPEN REDUCTION INTERNAL FIXATION (ORIF) WRIST FRACTURE;  Surgeon: Leora Lynwood SAUNDERS, MD;  Location: [facility] ORS;  Service: Orthopedics;  Laterality: Left;   RADIOLOGY WITH ANESTHESIA N/A 03/18/2020   Procedure: MRI WITH ANESTHESIA CERVICAL SPINE AND BRAIN  WITH AND WITHOUT CONTRAST;  Surgeon: Radiologist, Medication, MD;  Location: [facility] OR;  Service: Radiology;  Laterality: N/A;   right tibia fracture Right    2024   TUBAL LIGATION Bilateral    VAGINAL HYSTERECTOMY  03/2006   VAGINAL HYSTERECTOMY     2014   Patient Active Problem List   Diagnosis Date Noted   SOB (shortness of breath) on exertion 03/12/2024   Hyperglycemia 01/09/2024   Abnormal LFTs (liver function tests) 09/17/2023   Sinus tachycardia 07/07/2023   Aortic atherosclerosis 01/18/2023   Prediabetes 01/18/2023   Hypokalemia 07/29/2022   Weakness of both lower extremities 07/29/2022   Abscess of left groin 11/15/2021   Abnormal LFTs 11/15/2021   Acute respiratory disease due to COVID-19 virus 11/21/2020   Fatigue due to excessive exertion    Hypoalbuminemia due to protein-calorie malnutrition  Neurogenic bowel    Neurogenic bladder    Labile blood pressure    Neuropathic pain    Multiple sclerosis, relapsing-remitting 07/09/2019   Abscess of female pelvis    SVT (supraventricular tachycardia)    Radial styloid tenosynovitis 03/12/2018   Wheelchair dependence 02/27/2018   Localized osteoporosis with current pathological fracture with routine healing 01/19/2017   Wrist fracture 01/16/2017   Sprain of ankle 03/23/2016   Closed fracture of lateral malleolus 03/16/2016   Depression screening 01/24/2016   Blood pressure elevated without history of HTN 10/25/2015   Essential hypertension 10/25/2015   Multiple sclerosis 10/02/2015   Chronic  left shoulder pain 07/19/2015   Multiple sclerosis exacerbation 07/14/2015   MS (multiple sclerosis) 11/26/2014   Increased body mass index 11/26/2014   HPV test positive 11/26/2014   Status post laparoscopic supracervical hysterectomy 11/26/2014   Galactorrhea 11/26/2014   Back ache 05/21/2014   Adiposity 05/21/2014   Disordered sleep 05/21/2014   Muscle spasticity 05/21/2014   Spasticity 05/21/2014   Calculus of kidney 12/09/2013   Renal colic 12/09/2013   Hypercholesteremia 08/19/2013   Hereditary and idiopathic neuropathy 08/19/2013   Hypercholesterolemia without hypertriglyceridemia 08/19/2013   Bladder infection, chronic 07/25/2012   Disorder of bladder function 07/25/2012   Incomplete bladder emptying 07/25/2012   Microscopic hematuria 07/25/2012   Right upper quadrant pain 07/25/2012   ONSET DATE: 5/24 Amon and broke R leg which caused beginning of ADL decline; diagnosed with MS in 1995)  REFERRING DIAG: MS  THERAPY DIAG:  Muscle weakness (generalized)  Other lack of coordination  Multiple sclerosis exacerbation  Rationale for Evaluation and Treatment: Rehabilitation  SUBJECTIVE:  SUBJECTIVE STATEMENT: Pt reports legs are tired today. Pt accompanied by: self  PERTINENT HISTORY: Pt reports increased difficulty with basic self care tasks since breaking her R leg last May.  Since that time, mobility and ADLs have declined, with pt requiring assist from private caregivers and family to manage bathing, toileting, dressing, and functional transfers.  PRECAUTIONS: Fall  WEIGHT BEARING RESTRICTIONS: No  PAIN: 04/15/24: No pain today  Are you having pain? No; occasional pain in R lower back, but not today  FALLS: Has patient fallen in last 6 months? No Last fall was May 17 of 2024  LIVING ENVIRONMENT: Lives with: lives with their family (including mother who has dementia and sister [name])  Lives in: 2 level home but pt resides on main level Stairs: ramp  Has  following equipment at home: Wheelchair (manual), Shower bench, and hand held shower shower, 1 grab bar in the shower, 3in1 commode, sit to stand lift (electric), FWW, hospital bed  PLOF: modified indep with ADL/IADLs prior to May of 7975   PATIENT GOALS: Increase independence with basic self care tasks  OBJECTIVE:  Note: Objective measures were completed at Evaluation unless otherwise noted.  HAND DOMINANCE: Right  ADLs:  Overall ADLs: assist provided from paid caregiver and sister Transfers/ambulation related to ADLs: sit to stand lift for all transfers, set up for sliding board in/out of bed  Eating: indep  Grooming: modified indep (wc level in mom's bathroom)  UB Dressing: set up (can't access her bedroom closet)  LB Dressing: able to sit on side of bed to don all LB clothing; assist to hike pants/underwear Toileting: assist with clothing management d/t standing within electric lift Bathing: bed bath with sister helping to wash backside Tub Shower transfers: N/A; pt has walk in shower in mom's bathroom (wc does fit in this bathroom but pt reports  inability to transfer from wc and requires arm rests on a bench for a successful transfer attempt from any DME).  Tub bench is in pt's bathroom, but lift does not fit through bathroom doorway and pt reports inability to transfer up from tub bench (currently unable to manage either transfer)   Equipment: see above  IADLs: Shopping: [employer] for shopping Light housekeeping: modified indep from wc level Meal Prep: modified indep from wc level Community mobility: relies on community transportation or family members Medication management: indep Landscape architect: indep Handwriting: NT; pt denies any [facility] challenges  MOBILITY STATUS: Hx of falls  POSTURE COMMENTS:  Rounded shoulders, forward head, anterior pelvic tilt, and weight shift left   ACTIVITY TOLERANCE: Eval: Activity tolerance: Per PT; currently standing 30-60 sec within  Light Gait harness/lift; currently non-ambulatory  01/24/24: Standing at sink: 1 minute; min A and 5 attempts to achieve fully erect standing position with BUEs supported on sink countertop, OT assist to block R knee to prevent buckling   02/28/24: x4 attempts, achieves 75% upright standing with MIN A to block knees.   UPPER EXTREMITY ROM:  BUEs WFL  UPPER EXTREMITY MMT:     MMT Right eval Left eval  Shoulder flexion 4+ 4  Shoulder abduction 4+ 4  Shoulder adduction    Shoulder extension    Shoulder internal rotation 4+ 4+  Shoulder external rotation 4 4  Middle trapezius    Lower trapezius    Elbow flexion 4+ 4+  Elbow extension 4+ 4+  Wrist flexion 4+ 4+  Wrist extension 4+ 4+  Wrist ulnar deviation    Wrist radial deviation    Wrist pronation    Wrist supination    (Blank rows = not tested)  HAND FUNCTION/COORDINATION:  R/L WNL; pt denies any coordination deficits/ able to manipulate pills/clothing fasteners/etc without difficulty  11/15/23: *Measures taken d/t PT note revealing pt with increased difficulty manipulating smaller ADL supplies during reaching activities in PT session   11/15/23: Grip strength: R: 70 lbs, L: 63 lbs Pinch strength: Lateral: R: 16 lbs, L: 15 lbs; 3 point pinch: R: 12 lbs, L: 13 lbs 9 hole peg test: Right: 25 sec, L 49 sec   12/25/23: Grip strength: Right: 70 lbs; Left: 65 lbs  9 hole peg test: Right: 24 sec; Left: 3 trials: 54 sec, 1 min, 56 sec  01/24/24: Grip grip: Right: 76 lbs; Left: 67 lbs Lateral pinch: R: 17 lbs, L: 17 lbs; 3 point pinch: R: 13 lbs, L: 14 lbs  9 Hole Peg Test: R: 26 sec, L: 50 sec  02/28/24: Grip: Right: 85 lbs; Left: 71 lbs Lateral pinch: R: 20 lbs, L: 19 lbs; 3 point pinch: R: 17 lbs, L: 18 lbs  9 Hole Peg Test: R: 29 sec, L: 40 sec   04/10/24: Grip: Right: 85 lbs; Left: 81 lbs;   Lateral pinch: R: 18 lbs, L: 18 lbs; 3 point pinch: R: 17 lbs, L: 18 lbs   9 hole Peg Test: R: 25 sec, L 32 sec    SENSATION: WFL  EDEMA: No visible edema in BUEs  MUSCLE TONE: BUEs WNL  COGNITION: Overall cognitive status: Within functional limits for tasks assessed  VISION: wears glasses all the time, no reports of diplopia  PERCEPTION: Not tested  PRAXIS: WFL  OBSERVATIONS:  Pt pleasant, cooperative, and motivated to work towards improving indep with ADLs.  TREATMENT DATE: 04/15/24 Therapeutic Activity: -Pt participated in core strenghtening/stability exercise performing slight shifts from midline to reach forward and laterally R and L to reach for, toss, and catch ball, returning to midline after each toss.  Wc arm rests removed to increase core challenge without having BUE support; feet supported on floor.  Close SBA when reaching laterally outside BOS.  Self Care: -STS trials from wc, working towards standing pericare for sink bath.  RW resting against elevated hi/low table, with table adjusted to sink height at home.  Pt sat on 2 wc cushions for easier STS.  Mod A to block R knee and mod vc for forward WS.    PATIENT EDUCATION: Education details: Company Secretary Person educated: Patient Education method: Explanation, Actor cues, and Verbal cues Education comprehension: verbalized understanding and returned demonstration  HOME EXERCISE PROGRAM: IR A/AAROM to promote shoulder flexibility for peri care/bathing  GOALS: Goals reviewed with patient? Yes  SHORT TERM GOALS: Target date: 12/25/23  Pt will perform bed bath with set up only. Baseline: Eval: Min-mod A for posterior washing; 09/28/23: Min A for posterior washing; 11/13/23: Able to manage bed bath but sister helps with thoroughness, per pt's preference; pt in agreement to trial bath sitting EOB or in wc before next OT session. Goal status: achieved/d/c  2.  Pt will utilize sliding board for wc<>drop arm commode transfer with  SBA. Baseline: Eval: Currently using electric lift for transfer to [facility]; 09/28/23: Not yet completed at home but transfers to commode have been easier without sliding board d/t pt implementing a scoot pivot. Goal status: d/c (pt does not prefer sliding board for commode transfer)  3.  Pt will be indep to perform HEP for maintaining BUE strength for ADLs and functional transfers. Baseline: Eval: HEP not yet initiated; 09/28/23: Pt is working on core stability exercises from wc, cane stretches for bilat shoulder IR, and tricep extension via wc pushups; 11/13/23: indep Goal status: achieved  LONG TERM GOALS: Target date: 04/29/24  Pt will perform sink bath with set up A. (Revised on 11/13/23 from min A to set up) Baseline: Eval: Currently bed bathing d/t inability to stand at sink without electric lift (lift does not fit into pt's bathroom); 09/28/23: still completing bed bath as pt is not yet able to stand; 11/13/23: Not yet attempted; pt encouraged to attempt before next OT session now that bed bath goal has been met; 12/20/23: Pt is now completing sponge bath with min A while seated EOB (caregiver assists with washing peri area d/t no bed rail to aid in R lateral lean); 01/24/24: Performing with set up on EOB, completing lateral weight shifts to wash posterior/peri area with wc in front of pt and bed rail for LUE support and foot of bed for RUE support  Goal status: achieved  2.  Pt will perform wc<>tub bench transfer with min A. Baseline: Eval: Unable; 09/28/23: Performed in OT clinic with min A, but not yet in the home Goal status: d/c (pt prefers sink bath)  3.  Pt will perform squat pivot transfer wc<>BSC with SBA.  Baseline: Eval: Eval: Currently using electric lift for transfer to [facility]; 09/28/23: Not yet completed in clinic with [facility], and using lift for [facility] in the home still; 11/13/23: Still using Camie lift at home and in clinic transfers are all scoot rather than squat pivots d/t LE weakness; 12/20/23: Pt  reports that she plans to use her Camie lift until she can ambulate to her [facility] d/t  limited space for wc in her desired location for [facility] at home. Goal status: d/c  4. Pt will perform scoot transfer wc<>toilet using grab bars with supv to allow toileting in community setting.  Baseline: Eval: Pt limits community outings d/t requiring use of lift for [facility] transfers; 09/28/23: Pt has performed 2 successful scoot pivot  Transfers to toilet in OT clinic with min A, extra time.  Further trials with clothing management on toilet needed, but pt can lower and hike  pants while sititng edge of mat via lateral leaning and min A to maintain core stability; 11/13/23: Pt performs with CGA and extra time; 12/19/23: Pt  performs with close supv on most attempts, occasional CGA; efficiency is improving; 01/24/24: ongoing improvements; able to complete in fewer scoots (3 scoots each way from wc<>toilet) d/t improve engagement of Les and improved anterior weight shift; occasional min guard-supv; pt completes with therapist in clinic, but not yet in other community settings with sister, and not yet voided on toilet in community. 02/28/24: has not trialed in community, completes with SBA; 04/10/24: deferred in recent visits to focus on standing for sink bath, per pt request    Goal status: ongoing  5.  Pt will complete seated clothing management with min A to enable voiding in handicapped stall within community. Baseline: Recert 11/13/23: Pt can transfer to toilet with increased time and effort, but has not yet attempted clothing management or voiding while seated on commode in community setting.  Pt can lower pants to knees while seated in wc, but requires mod A to hike over hips from seated position; 12/20/23: Pt fully lowers panties and shorts while seated on commode with supv, can hike clothing to her her upper thighs, transfers back to wc, and then performs a tricep extension from wc level to allow caregiver to hike clothing  remainder of the way for purposes of EC (min A for clothing management component) Goal status: achieved  6.  Pt will tolerate standing x1 min with close supv and BUE support to manage clothing in prep for toileting. Baseline: Eval: Currently standing in electric lift only, or within parallel bars without lift; 09/28/23: Not yet attempted; 11/13/23: Pt is able to perform static standing with heavy BUE support on parallel bars with PT; 12/19/22: Not attempted recently d/t new intermittent clonus in BLEs; 01/24/24: Tolerated standing 1 min today at sink with min A for ascent, OT assist to block R knee from buckling. 02/28/24: tolerates up to 1 min standing, difficulty achieving stand 2/2 fatigue from PT; 04/10/24: Pt is inconsistent to achieve full standing position, but when able, can tolerate 1-2 min with CGA-min A and RW for support.  Pt was able to remove R hand briefly from walker (~3 sec or less) to touch R buttocks to simulate reach for clothing management and peri care.  Goal status: in progress  7.  Pt will increase bilat grip strength in order to securely grasp walker sufficiently for functional transfers and ambulation.  Baseline: Recert 11/13/23: TBD (PT note read following OT session, indicating pt with difficulty securing items in hand);  11/15/23: Grip strength: R: 70 lbs, L: 63 lbs; 12/25/23: R 76 lbs, L 67 lbs; sufficient for functional transfers/amb  Goal status: achieved  8.  Pt will increase L [facility] skills in order to manipulate small ADL supplies with reduced dropping as indicated by 20 sec or more improvement in 9 hole peg test. Baseline: Recert 11/13/23: 9 hole peg test TBD (PT note read  following OT session, indicating pt with difficulty manipulating and securing items in hands); 11/15/23: Right: 25 sec, L 49 sec; 12/25/23: Right: 24 sec; Left: 3 trials: 54 sec, 1 min, 56 sec; 01/24/24: R: 26 sec, L: 50 sec. 02/28/24: L: 40 sec; 04/10/24: R 25 sec, L 32 sec   Goal status: in  progress  ASSESSMENT: CLINICAL IMPRESSION: Pt unable to achieve full standing position today, but achieved ~75% of full standing on 5 trials, given mod A to block R knee and mod vc for anterior pelvic tilt.  Pt tended to lift buttocks only vertically from BUEs pushing up from arm rests of wc, but was able to correct after visual demo of anterior pelvic tilt, allowing near standing success.  Pt acknowledged legs to be very tired today, which prompted shift to focus on core stability exercise/activity.  Pt continues to require close SBA when reaching laterally L or R and shifting slightly out of midline when arm rests of wc are removed.  Pt requires frequent rest breaks when not using BUEs to support herself in sitting, and occasionally braces self with a forearm or hand on her leg to return to midline after reaching with outstretched arms to catch or reach for ball.  Pt will continue to benefit from skilled OT to work towards above noted goals in OT poc, working to maximize indep with daily tasks while reducing burden of care on caregivers.   PERFORMANCE DEFICITS: in functional skills including ADLs, IADLs, strength, pain, flexibility, Gross motor control, mobility, balance, body mechanics, endurance, and decreased knowledge of use of DME, and psychosocial skills including coping strategies, environmental adaptation, habits, and routines and behaviors.   IMPAIRMENTS: are limiting patient from ADLs, IADLs, and social participation.   CO-MORBIDITIES: has co-morbidities such as neuropathy, back pain, obesity, MS, HTN that affects occupational performance. Patient will benefit from skilled OT to address above impairments and improve overall function.  MODIFICATION OR ASSISTANCE TO COMPLETE EVALUATION: No modification of tasks or assist necessary to complete an evaluation.  OT OCCUPATIONAL PROFILE AND HISTORY: Detailed assessment: Review of records and additional review of physical, cognitive, psychosocial  history related to current functional performance.  CLINICAL DECISION MAKING: Moderate - several treatment options, min-mod task modification necessary  REHAB POTENTIAL: Good  EVALUATION COMPLEXITY: Moderate  PLAN:  OT FREQUENCY: 2x/week  OT DURATION: 12 weeks  PLANNED INTERVENTIONS: 97168 OT Re-evaluation, 97535 self care/ADL training, 02889 therapeutic exercise, 97530 therapeutic activity, 97112 neuromuscular re-education, 97140 manual therapy, 97116 gait training, 02989 moist heat, 97010 cryotherapy, balance training, functional mobility training, psychosocial skills training, energy conservation, coping strategies training, patient/family education, and DME and/or AE instructions  RECOMMENDED OTHER SERVICES: None at this time (Pt currently receiving PT services in this clinic)  CONSULTED AND AGREED WITH PLAN OF CARE: Patient  PLAN FOR NEXT SESSION: see above  Inocente Blazing, MS, OTR/L  Inocente MARLA Blazing, OT 04/17/2024, 10:37 AM

## 2024-04-17 NOTE — Therapy (Signed)
 OUTPATIENT PHYSICAL THERAPY NEURO TREATMENT   Patient Name: Lisa Williams MRN: [id number] DOB:1961-04-11, 63 y.o., female Today's Date: 04/17/2024  PCP: Lenon Na  REFERRING PROVIDER: Lenon Na   END OF SESSION:  PT End of Session - 04/17/24 1041     Visit Number 76    Number of Visits 94    Date for Recertification  06/12/24   corrected   Progress Note Due on Visit 50    PT Start Time 1019    PT Stop Time 1100    PT Time Calculation (min) 41 min    Equipment Utilized During Treatment Gait belt    Activity Tolerance Patient tolerated treatment well;No increased pain    Behavior During Therapy [facility] for tasks assessed/performed         Past Medical History:  Diagnosis Date   Abdominal pain, right upper quadrant    Back pain    Calculus of kidney 12/09/2013   Chronic back pain    unspecified   Chronic left shoulder pain 07/19/2015   Complication of anesthesia    Functional disorder of bladder    other   Galactorrhea 11/26/2014   Chronic    Hereditary and idiopathic neuropathy 08/19/2013   High cholesterol    History of kidney stones    HPV test positive    Hypercholesteremia 08/19/2013   Hypertension    Hypertension    Hypothyroidism    Incomplete bladder emptying    Microscopic hematuria    MS (multiple sclerosis)    Muscle spasticity 05/21/2014   Nonspecific findings on examination of urine    other   Osteopenia    PONV (postoperative nausea and vomiting)    Prediabetes    Sickle cell trait    Status post laparoscopic supracervical hysterectomy 11/26/2014   Tobacco user 11/26/2014   Wrist fracture    Past Surgical History:  Procedure Laterality Date   bilateral tubal ligation  1996   BREAST CYST EXCISION Left 2002   CYST EXCISION Left 05/10/2022   Procedure: CYST REMOVAL;  Surgeon: Rodolph Romano, MD;  Location: [facility] ORS;  Service: General;  Laterality: Left;   FRACTURE SURGERY     KNEE SURGERY     right    LAPAROSCOPIC SUPRACERVICAL HYSTERECTOMY  08/05/2013   ORIF WRIST FRACTURE Left 01/17/2017   Procedure: OPEN REDUCTION INTERNAL FIXATION (ORIF) WRIST FRACTURE;  Surgeon: Leora Lynwood SAUNDERS, MD;  Location: [facility] OR[facility];  Service: Orthopedics;  Laterality: Left;   RADIOLOGY WITH ANESTHESIA N/A 03/18/2020   Procedure: MRI WITH ANESTHESIA CERVICAL SPINE AND BRAIN  WITH AND WITHOUT CONTRAST;  Surgeon: Radiologist, Medication, MD;  Location: [facility] OR;  Service: Radiology;  Laterality: N/A;   right tibia fracture Right    2024   TUBAL LIGATION Bilateral    VAGINAL HYSTERECTOMY  03/2006   VAGINAL HYSTERECTOMY     2014   Patient Active Problem List   Diagnosis Date Noted   SOB (shortness of breath) on exertion 03/12/2024   Hyperglycemia 01/09/2024   Abnormal LFTs (liver function tests) 09/17/2023   Sinus tachycardia 07/07/2023   Aortic atherosclerosis 01/18/2023   Prediabetes 01/18/2023   Hypokalemia 07/29/2022   Weakness of both lower extremities 07/29/2022   Abscess of left groin 11/15/2021   Abnormal LFTs 11/15/2021   Acute respiratory disease due to COVID-19 virus 11/21/2020   Fatigue due to excessive exertion    Hypoalbuminemia due to protein-calorie malnutrition    Neurogenic bowel    Neurogenic bladder    Labile blood  pressure    Neuropathic pain    Multiple sclerosis, relapsing-remitting 07/09/2019   Abscess of female pelvis    SVT (supraventricular tachycardia)    Radial styloid tenosynovitis 03/12/2018   Wheelchair dependence 02/27/2018   Localized osteoporosis with current pathological fracture with routine healing 01/19/2017   Wrist fracture 01/16/2017   Sprain of ankle 03/23/2016   Closed fracture of lateral malleolus 03/16/2016   Depression screening 01/24/2016   Blood pressure elevated without history of HTN 10/25/2015   Essential hypertension 10/25/2015   Multiple sclerosis 10/02/2015   Chronic left shoulder pain 07/19/2015   Multiple sclerosis exacerbation 07/14/2015    MS (multiple sclerosis) 11/26/2014   Increased body mass index 11/26/2014   HPV test positive 11/26/2014   Status post laparoscopic supracervical hysterectomy 11/26/2014   Galactorrhea 11/26/2014   Back ache 05/21/2014   Adiposity 05/21/2014   Disordered sleep 05/21/2014   Muscle spasticity 05/21/2014   Spasticity 05/21/2014   Calculus of kidney 12/09/2013   Renal colic 12/09/2013   Hypercholesteremia 08/19/2013   Hereditary and idiopathic neuropathy 08/19/2013   Hypercholesterolemia without hypertriglyceridemia 08/19/2013   Bladder infection, chronic 07/25/2012   Disorder of bladder function 07/25/2012   Incomplete bladder emptying 07/25/2012   Microscopic hematuria 07/25/2012   Right upper quadrant pain 07/25/2012    ONSET DATE: 1995  REFERRING DIAG: MS  THERAPY DIAG:  Muscle weakness (generalized)  Other lack of coordination  Multiple sclerosis exacerbation  Difficulty in walking, not elsewhere classified  Unsteadiness on feet  Abnormality of gait and mobility  Other abnormalities of gait and mobility  Rationale for Evaluation and Treatment: Rehabilitation  SUBJECTIVE:                                                                                                                                                                                             SUBJECTIVE STATEMENT:  Pt reports she is doing well.  Pt states she is in a good Christmas mood.    Pt accompanied by: self  PERTINENT HISTORY:  Patient is returning to PT s/p hospitalization.  s/p ORIF of R tibia shaft fracture 09/23/2022. Patient has weakness in BLE with RLE>LLE. She drives with hand controls. Patient has been diagnosed with MS in 1995. PMH includes: back pain, CBP, chronic L shoulder pain, galactorrhea, neuropathy, HPV, hypercholestermeia, HTN, MS, osteopenia, PONV, wrist fracture. Additional order for other closed fracture of proximal end of R tibia with routine healing. Still has to use [employer].   PAIN:  Are you having pain? Occasional pain in RLE; primarily in knee  PRECAUTIONS: Fall  RED FLAGS: None   WEIGHT BEARING  RESTRICTIONS: No  FALLS: Has patient fallen in last 6 months? No  LIVING ENVIRONMENT: Lives with: lives with their family Lives in: House/apartment Stairs: ramp Has following equipment at home: Vannie - 2 wheeled, Wheelchair (manual), shower chair, and Grab bars  PLOF: Independent with household mobility with device  PATIENT GOALS: to get her independence back. To be able to get into car, toilet, and get dressed independently  OBJECTIVE:  Note: Objective measures were completed at Evaluation unless otherwise noted.  DIAGNOSTIC FINDINGS: MRI of the brain 03/18/2020 showed multiple T2/FLAIR hyperintense foci in the periventricular, juxtacortical and deep white matter.  There were no infratentorial lesions noted.  None of the foci enhanced.   Due to severe claustrophobia, the study was done with conscious sedation in the hospital   COGNITION: Overall cognitive status: Within functional limits for tasks assessed   SENSATION: Lack of sensation in feet, loss in bilateral lateral aspect of knee  COORDINATION: Does not have the strength for functional LE heel slide test   MUSCLE TONE: BLE mild tone    POSTURE: rounded shoulders, forward head, anterior pelvic tilt, and weight shift left   LOWER EXTREMITY MMT:    MMT Right Eval Left Eval  Hip flexion 0.9 1.5  Hip extension    Hip abduction 1.8 2.6  Hip adduction 3.1 1.9  Hip internal rotation    Hip external rotation    Knee flexion 0.9 1.5  Knee extension 0.8 1.2  Ankle dorsiflexion    Ankle plantarflexion    Ankle inversion    Ankle eversion    (Blank rows = not tested)  BED MOBILITY:  Assess in future session due to limited time  TRANSFERS: Assistive device utilized: Bariatric RW with sit to stands, slide board to table  Sit to stand: unable to reach full stand Stand to sit:  unable to reach full stand  Chair to chair: slide board with CGA   GAIT: Unable to ambulate at this time.   FUNCTIONAL TESTS:  Sit to stand: tricep press with BUE; unable to bring arm to walker.    Function In Sitting Test (FIST)  (1/2 femur on surface; hips/knees flexed to 90deg)   - indicate bed or mat table / step stool if used  SCORING KEY: 4 = Independent (completes task independently & successfully) 3 = Verbal Cues/Increased Time (completes task independently & successfully and only needs more time/cues) 2 = Upper Extremity Support (must use UE for support or assistance to complete successfully) 1 = Needs Assistance (unable to complete w/o physical assist; DOCUMENT LEVEL: min, mod, max) 0 = Dependent (requires complete physical assist; unable to complete successfully even w/ physical assist)  Randomly Administer Once Throughout Exam  4 - Anterior Nudge (superior sternum)  4 - Posterior Nudge (between scapular spines)  4 - Lateral Nudge (to dominant side at acromion)     4 - Static sitting (30 seconds)  4 - Sitting, shake no (left and right)  4 - Sitting, eyes closed (30 seconds)   0 - Sitting, lift foot (dominant side, lift foot 1 inch twice)    2 - Pick up object from behind (object at midline, hands breadth posterior)  3 - Forward reach (use dominant arm, must complete full motion) 2 - Lateral reach (use dominant arm, clear opposite ischial tuberosity) 2 - Pick up object from floor (from between feet)   2 - Posterior scooting (move backwards 2 inches)  2 - Anterior scooting (move forward 2 inches)  2 -  Lateral scooting (move to dominant side 2 inches)    TOTAL = 39/56  Notes/comments: slide board tranfer to/from table   MCD > 5 points MCID for IP REHAB > 6 points  Function In Sitting Test (FIST) 08/14/23 (1/2 femur on surface; hips/knees flexed to 90deg)   - indicate bed or mat table / step stool if used  SCORING KEY: 4 = Independent (completes task  independently & successfully) 3 = Verbal Cues/Increased Time (completes task independently & successfully and only needs more time/cues) 2 = Upper Extremity Support (must use UE for support or assistance to complete successfully) 1 = Needs Assistance (unable to complete w/o physical assist; DOCUMENT LEVEL: min, mod, max) 0 = Dependent (requires complete physical assist; unable to complete successfully even w/ physical assist)  Randomly Administer Once Throughout Exam  4 - Anterior Nudge (superior sternum)  4 - Posterior Nudge (between scapular spines)  4 - Lateral Nudge (to dominant side at acromion)     4 - Static sitting (30 seconds)  4 - Sitting, shake no (left and right)  4 - Sitting, eyes closed (30 seconds)   0 - Sitting, lift foot (dominant side, lift foot 1 inch twice)    4 - Pick up object from behind (object at midline, hands breadth posterior)  4 - Forward reach (use dominant arm, must complete full motion) 4 - Lateral reach (use dominant arm, clear opposite ischial tuberosity)  - Pick up object from floor (from between feet)   3 - Posterior scooting (move backwards 2 inches)  3 - Anterior scooting (move forward 2 inches)  3 - Lateral scooting (move to dominant side 2 inches)    TOTAL = 47/56  MCD > 5 points MCID for IP REHAB > 6 points   TREATMENT DATE: 04/17/2024   Gait:   Pt ambulated for a total of 2 attempts, 1' on first attempt, 34' on the second attempt (14' in total).  Pt noted her LE's just didn't feel correct today and elected to pursue different tasks during session.  Pt utilized the bariatric FWW, pillow under the thighs for easier ability to come upright, and sock applied to the L foot to reduce friction of the foot in mobility.    Rest breaks given and pt's symptoms monitored throughout the session.      TherEx: To improve strength, endurance, mobility, and function of specific targeted muscle groups or improve joint range of motion or improve  muscle flexibility  Seated cable column tricep extensions, 12.5#, 2x15 Seated cable column scapular retractions, 17.5#, 2x15 Seated lift offs for increased GHJ strengthening, x5   TherAct:  Seated lift offs for increased GHJ strengthening, x5 Standing in // bars with subjective gathering, 3 min 35 sec on first attempt, seated rest break utilized, 2 min 55 sec on the second attempt   PATIENT EDUCATION:  Education details: Pt educated throughout session about proper posture and technique with exercises. Improved exercise technique, movement at target joints, use of target muscles after min to mod verbal, visual, tactile cues.  Person educated: Patient Education method: Explanation, Demonstration, Tactile cues, and Verbal cues Education comprehension: verbalized understanding, returned demonstration, verbal cues required, tactile cues required, and needs further education    HOME EXERCISE PROGRAM: Stand 3x/day in stander   GOALS: Goals reviewed with patient? Yes  SHORT TERM GOALS: Target date: 08/08/2023  Patient will be independent in home exercise program to improve strength/mobility for better functional independence with ADLs.  Baseline: 4/8: compliance Goal status:  MET    LONG TERM GOALS: Target date: 03/20/2024  Patient will tolerate five consecutive stands with UE support from wheelchair to standing to improve functional mobility with additional cushion and RW.  Baseline: unable to perform 4/8:unable to perform full stand 5/20: perform next session 5/29: two full stands with CGA and cushion on her seat 7/31: able to stand 3 times from [facility]. With no addition cushion  in parallel bars. 12/27/2023= Will retest next session- concentrated on just standing on past 2 visits 03/20/24:  Goal status: Ongoing   2.  Patient will ambulate 10 ft with bRW with wheelchair follow.  Baseline:  unable to ambulate 4/8: unable to ambulate 5/20: able to ambulate length of // bars in previous  session 5/27: ambulate 2.5 lengths of // bars with two seated rest breaks with min A for weight shift and wheelchair follow  7/31: able to ambulate in parallel bars 3 ft with min assist and +2 follow in WC> difficulty advancing the LLE on this day. 12/27/2023- Patient unable to take a step forward today but was abel to take a step on previous visit- limited by clonus like trembling that has progressed over past month. 03/20/24: 26' at one time Goal status: MET   3.  Patient will improve FIST score >6 points to demonstrate improved stability and ability to perform ADLs.  Baseline:  3/6: 39/56 4/8: 47/ 56 5/20: 48/56 7/31:46/56 Goal status: MET  4.  Patient will perform toileting with mod I at home for improved independence.  Baseline: 3/6: requires assistance 4/8: requires dependence on machine 5/20: requires assistance with use of wipe buddy.  7/31: continues to require use of sara steady and wipe buddy for safety and hygiene. 12/27/2023- Patient reports that since she is unable to take steps that she continues to require use of sara steady and wipe buddy for safe personal hygiene. 03/20/24: Pt utilizing sara stedy to perform safe personal hygiene, however is no longer utilizing the wipe buddy. Goal status: PROGRESSING   5. Patient will perform > 5 min of static or dynamic standing with BUE and Min/mod assist  for improved LE strength and pre-gait function to assist with toileting and overall standing ability for improve functional independence.  Baseline: 12/27/2023-Patient was able to stand for just over 3 min today. 01/10/24: Pt able to stand 1 min 38 sec 02/14/24: Pt able to >3 minutes at this time. 03/20/24: Pt able to achieve >4 minutes at this time. Goal status: PROGRESSING  6.  Patient will ambulate 40 ft with BRW with wheelchair follow, in one attempt, without rest break. Baseline: 61' during one attempt. Goal status: NEW   ASSESSMENT:  CLINICAL IMPRESSION:  Pt responded well to the  change in therapy approach.   Pt noted to have some difficulty with the LE's when attempting to ambulate in the hallway.  Pt elected to participate in other exercises and responded well to those techniques.  Pt was able to stand for a much longer period of time during the session today compared to prior sessions.  Pt ultimately is making significant improvements and will continue with therapy progressions moving forward.   Pt will continue to benefit from skilled therapy to address remaining deficits in order to improve overall QoL and return to PLOF.        OBJECTIVE IMPAIRMENTS: Abnormal gait, cardiopulmonary status limiting activity, decreased activity tolerance, decreased balance, decreased coordination, decreased endurance, decreased mobility, difficulty walking, decreased ROM, decreased strength, hypomobility, increased fascial restrictions, impaired perceived  functional ability, impaired flexibility, impaired sensation, improper body mechanics, and postural dysfunction.   ACTIVITY LIMITATIONS: carrying, lifting, bending, sitting, standing, squatting, sleeping, stairs, transfers, bed mobility, continence, bathing, toileting, dressing, self feeding, reach over head, hygiene/grooming, locomotion level, and caring for others  PARTICIPATION LIMITATIONS: meal prep, cleaning, laundry, interpersonal relationship, driving, shopping, community activity, and church  PERSONAL FACTORS: Age, Past/current experiences, Time since onset of injury/illness/exacerbation, Transportation, and 3+ comorbidities: ack pain, CBP, chronic L shoulder pain, galactorrhea, neuropathy, HPV, hypercholestermeia, HTN, MS, osteopenia, PONV, wrist fracture are also affecting patient's functional outcome.   REHAB POTENTIAL: Good  CLINICAL DECISION MAKING: Evolving/moderate complexity  EVALUATION COMPLEXITY: Moderate  PLAN:  PT FREQUENCY: 2x/week  PT DURATION: 12 weeks  PLANNED INTERVENTIONS: 97164- PT Re-evaluation,  97110-Therapeutic exercises, 97530- Therapeutic activity, 97112- Neuromuscular re-education, 97535- Self Care, [id number]- Manual therapy, [phone]- Gait training, [phone]- Orthotic Fit/training, [phone]- Canalith repositioning, [id number]- Aquatic Therapy, [phone]- Electrical stimulation (unattended), [phone]- Electrical stimulation (manual), [id number]- Ultrasound, [id number]- Traction (mechanical), Patient/Family education, Balance training, Stair training, Taping, Dry Needling, Joint mobilization, Joint manipulation, Spinal manipulation, Spinal mobilization, Scar mobilization, Compression bandaging, Vestibular training, Visual/preceptual remediation/compensation, Cognitive remediation, DME instructions, Cryotherapy, Moist heat, and Biofeedback  PLAN FOR NEXT SESSION:  Strap stand frame with more dynamic activities Sit to stands; continue supported walking, continue dynamic core stabilization training and seated reaching tasks to improve weight shifting.  Continue with ambulation in the hallway with the bariatric walker, sock, and use of cushion for easier lift off.   Fonda Simpers, PT, DPT Physical Therapist - [facility]  04/17/2024, 1:54 PM

## 2024-04-20 NOTE — Therapy (Signed)
 OUTPATIENT OCCUPATIONAL THERAPY NEURO TREATMENT NOTE  Patient Name: Lisa Williams MRN: [id number] DOB:[birth year], 63 y.o., female Today's Date: 04/20/2024  PCP: Dr. Layman Piety   Neurologist Dr. Suanne at [facility] REFERRING PROVIDER: Dr. Layman Piety    END OF SESSION:  OT End of Session - 04/20/24 1944     Visit Number 62    Number of Visits 67    Date for Recertification  04/29/24    Authorization Time Period Reporting period beginning 04/10/24    Progress Note Due on Visit 70    OT Start Time 1100    OT Stop Time 1140    OT Time Calculation (min) 40 min    Equipment Utilized During Treatment manual wc    Activity Tolerance Patient tolerated treatment well    Behavior During Therapy [facility] for tasks assessed/performed         Past Medical History:  Diagnosis Date   Abdominal pain, right upper quadrant    Back pain    Calculus of kidney 12/09/2013   Chronic back pain    unspecified   Chronic left shoulder pain 07/19/2015   Complication of anesthesia    Functional disorder of bladder    other   Galactorrhea 11/26/2014   Chronic    Hereditary and idiopathic neuropathy 08/19/2013   High cholesterol    History of kidney stones    HPV test positive    Hypercholesteremia 08/19/2013   Hypertension    Hypertension    Hypothyroidism    Incomplete bladder emptying    Microscopic hematuria    MS (multiple sclerosis)    Muscle spasticity 05/21/2014   Nonspecific findings on examination of urine    other   Osteopenia    PONV (postoperative nausea and vomiting)    Prediabetes    Sickle cell trait    Status post laparoscopic supracervical hysterectomy 11/26/2014   Tobacco user 11/26/2014   Wrist fracture    Past Surgical History:  Procedure Laterality Date   bilateral tubal ligation  1996   BREAST CYST EXCISION Left 2002   CYST EXCISION Left 05/10/2022   Procedure: CYST REMOVAL;  Surgeon: Rodolph Romano, MD;  Location: [facility] ORS;  Service:  General;  Laterality: Left;   FRACTURE SURGERY     KNEE SURGERY     right   LAPAROSCOPIC SUPRACERVICAL HYSTERECTOMY  08/05/2013   ORIF WRIST FRACTURE Left 01/17/2017   Procedure: OPEN REDUCTION INTERNAL FIXATION (ORIF) WRIST FRACTURE;  Surgeon: Leora Lynwood SAUNDERS, MD;  Location: [facility] ORS;  Service: Orthopedics;  Laterality: Left;   RADIOLOGY WITH ANESTHESIA N/A 03/18/2020   Procedure: MRI WITH ANESTHESIA CERVICAL SPINE AND BRAIN  WITH AND WITHOUT CONTRAST;  Surgeon: Radiologist, Medication, MD;  Location: [facility] OR;  Service: Radiology;  Laterality: N/A;   right tibia fracture Right    2024   TUBAL LIGATION Bilateral    VAGINAL HYSTERECTOMY  03/2006   VAGINAL HYSTERECTOMY     2014   Patient Active Problem List   Diagnosis Date Noted   SOB (shortness of breath) on exertion 03/12/2024   Hyperglycemia 01/09/2024   Abnormal LFTs (liver function tests) 09/17/2023   Sinus tachycardia 07/07/2023   Aortic atherosclerosis 01/18/2023   Prediabetes 01/18/2023   Hypokalemia 07/29/2022   Weakness of both lower extremities 07/29/2022   Abscess of left groin 11/15/2021   Abnormal LFTs 11/15/2021   Acute respiratory disease due to COVID-19 virus 11/21/2020   Fatigue due to excessive exertion    Hypoalbuminemia due to protein-calorie malnutrition  Neurogenic bowel    Neurogenic bladder    Labile blood pressure    Neuropathic pain    Multiple sclerosis, relapsing-remitting 07/09/2019   Abscess of female pelvis    SVT (supraventricular tachycardia)    Radial styloid tenosynovitis 03/12/2018   Wheelchair dependence 02/27/2018   Localized osteoporosis with current pathological fracture with routine healing 01/19/2017   Wrist fracture 01/16/2017   Sprain of ankle 03/23/2016   Closed fracture of lateral malleolus 03/16/2016   Depression screening 01/24/2016   Blood pressure elevated without history of HTN 10/25/2015   Essential hypertension 10/25/2015   Multiple sclerosis 10/02/2015   Chronic  left shoulder pain 07/19/2015   Multiple sclerosis exacerbation 07/14/2015   MS (multiple sclerosis) 11/26/2014   Increased body mass index 11/26/2014   HPV test positive 11/26/2014   Status post laparoscopic supracervical hysterectomy 11/26/2014   Galactorrhea 11/26/2014   Back ache 05/21/2014   Adiposity 05/21/2014   Disordered sleep 05/21/2014   Muscle spasticity 05/21/2014   Spasticity 05/21/2014   Calculus of kidney 12/09/2013   Renal colic 12/09/2013   Hypercholesteremia 08/19/2013   Hereditary and idiopathic neuropathy 08/19/2013   Hypercholesterolemia without hypertriglyceridemia 08/19/2013   Bladder infection, chronic 07/25/2012   Disorder of bladder function 07/25/2012   Incomplete bladder emptying 07/25/2012   Microscopic hematuria 07/25/2012   Right upper quadrant pain 07/25/2012   ONSET DATE: 5/24 Amon and broke R leg which caused beginning of ADL decline; diagnosed with MS in 1995)  REFERRING DIAG: MS  THERAPY DIAG:  Muscle weakness (generalized)  Other lack of coordination  Multiple sclerosis exacerbation  Rationale for Evaluation and Treatment: Rehabilitation  SUBJECTIVE:  SUBJECTIVE STATEMENT: Pt reports doing well today. Pt accompanied by: self  PERTINENT HISTORY: Pt reports increased difficulty with basic self care tasks since breaking her R leg last May.  Since that time, mobility and ADLs have declined, with pt requiring assist from private caregivers and family to manage bathing, toileting, dressing, and functional transfers.  PRECAUTIONS: Fall  WEIGHT BEARING RESTRICTIONS: No  PAIN: 04/17/24: No pain today  Are you having pain? No; occasional pain in R lower back, but not today  FALLS: Has patient fallen in last 6 months? No Last fall was May 17 of 2024  LIVING ENVIRONMENT: Lives with: lives with their family (including mother who has dementia and sister [name])  Lives in: 2 level home but pt resides on main level Stairs: ramp  Has  following equipment at home: Wheelchair (manual), Shower bench, and hand held shower shower, 1 grab bar in the shower, 3in1 commode, sit to stand lift (electric), FWW, hospital bed  PLOF: modified indep with ADL/IADLs prior to May of 7975   PATIENT GOALS: Increase independence with basic self care tasks  OBJECTIVE:  Note: Objective measures were completed at Evaluation unless otherwise noted.  HAND DOMINANCE: Right  ADLs:  Overall ADLs: assist provided from paid caregiver and sister Transfers/ambulation related to ADLs: sit to stand lift for all transfers, set up for sliding board in/out of bed  Eating: indep  Grooming: modified indep (wc level in mom's bathroom)  UB Dressing: set up (can't access her bedroom closet)  LB Dressing: able to sit on side of bed to don all LB clothing; assist to hike pants/underwear Toileting: assist with clothing management d/t standing within electric lift Bathing: bed bath with sister helping to wash backside Tub Shower transfers: N/A; pt has walk in shower in mom's bathroom (wc does fit in this bathroom but pt reports inability  to transfer from wc and requires arm rests on a bench for a successful transfer attempt from any DME).  Tub bench is in pt's bathroom, but lift does not fit through bathroom doorway and pt reports inability to transfer up from tub bench (currently unable to manage either transfer)   Equipment: see above  IADLs: Shopping: [employer] for shopping Light housekeeping: modified indep from wc level Meal Prep: modified indep from wc level Community mobility: relies on community transportation or family members Medication management: indep Landscape architect: indep Handwriting: NT; pt denies any FMC challenges  MOBILITY STATUS: Hx of falls  POSTURE COMMENTS:  Rounded shoulders, forward head, anterior pelvic tilt, and weight shift left   ACTIVITY TOLERANCE: Eval: Activity tolerance: Per PT; currently standing 30-60 sec within  Light Gait harness/lift; currently non-ambulatory  01/24/24: Standing at sink: 1 minute; min A and 5 attempts to achieve fully erect standing position with BUEs supported on sink countertop, OT assist to block R knee to prevent buckling   02/28/24: x4 attempts, achieves 75% upright standing with MIN A to block knees.   UPPER EXTREMITY ROM:  BUEs WFL  UPPER EXTREMITY MMT:     MMT Right eval Left eval  Shoulder flexion 4+ 4  Shoulder abduction 4+ 4  Shoulder adduction    Shoulder extension    Shoulder internal rotation 4+ 4+  Shoulder external rotation 4 4  Middle trapezius    Lower trapezius    Elbow flexion 4+ 4+  Elbow extension 4+ 4+  Wrist flexion 4+ 4+  Wrist extension 4+ 4+  Wrist ulnar deviation    Wrist radial deviation    Wrist pronation    Wrist supination    (Blank rows = not tested)  HAND FUNCTION/COORDINATION:  R/L WNL; pt denies any coordination deficits/ able to manipulate pills/clothing fasteners/etc without difficulty  11/15/23: *Measures taken d/t PT note revealing pt with increased difficulty manipulating smaller ADL supplies during reaching activities in PT session   11/15/23: Grip strength: R: 70 lbs, L: 63 lbs Pinch strength: Lateral: R: 16 lbs, L: 15 lbs; 3 point pinch: R: 12 lbs, L: 13 lbs 9 hole peg test: Right: 25 sec, L 49 sec   12/25/23: Grip strength: Right: 70 lbs; Left: 65 lbs  9 hole peg test: Right: 24 sec; Left: 3 trials: 54 sec, 1 min, 56 sec  01/24/24: Grip grip: Right: 76 lbs; Left: 67 lbs Lateral pinch: R: 17 lbs, L: 17 lbs; 3 point pinch: R: 13 lbs, L: 14 lbs  9 Hole Peg Test: R: 26 sec, L: 50 sec  02/28/24: Grip: Right: 85 lbs; Left: 71 lbs Lateral pinch: R: 20 lbs, L: 19 lbs; 3 point pinch: R: 17 lbs, L: 18 lbs  9 Hole Peg Test: R: 29 sec, L: 40 sec   04/10/24: Grip: Right: 85 lbs; Left: 81 lbs;   Lateral pinch: R: 18 lbs, L: 18 lbs; 3 point pinch: R: 17 lbs, L: 18 lbs   9 hole Peg Test: R: 25 sec, L 32 sec    SENSATION: WFL  EDEMA: No visible edema in BUEs  MUSCLE TONE: BUEs WNL  COGNITION: Overall cognitive status: Within functional limits for tasks assessed  VISION: wears glasses all the time, no reports of diplopia  PERCEPTION: Not tested  PRAXIS: WFL  OBSERVATIONS:  Pt pleasant, cooperative, and motivated to work towards improving indep with ADLs.  TREATMENT DATE: 04/17/24 Therapeutic Activity: -Practiced weight shifting with increased anterior pelvic tilt, working to progress from a scoot pivot to a squat pivot transfer.  Pt completed 4 trials wc<>mat table, but utilized sliding board for guarding, with OT providing, tactile cues to engage quads, min A for LE positioning throughout transfers and mod vc for anterior pelvic tilt to allow increased lift of buttocks from chair.  Self Care: -STS trials from wc, working towards standing pericare for sink bath.  RW resting against elevated hi/low table, with table adjusted to sink height at home.  Pt sat on 2 wc cushions for easier STS.  Mod A to block R knee and mod vc for forward WS.    PATIENT EDUCATION: Education details: STS technique, squat pivot technique Person educated: Patient Education method: Explanation, Actor cues, and Verbal cues Education comprehension: verbalized understanding and returned demonstration  HOME EXERCISE PROGRAM: IR A/AAROM to promote shoulder flexibility for peri care/bathing  GOALS: Goals reviewed with patient? Yes  SHORT TERM GOALS: Target date: 12/25/23  Pt will perform bed bath with set up only. Baseline: Eval: Min-mod A for posterior washing; 09/28/23: Min A for posterior washing; 11/13/23: Able to manage bed bath but sister helps with thoroughness, per pt's preference; pt in agreement to trial bath sitting EOB or in wc before next OT session. Goal status: achieved/d/c  2.  Pt will utilize  sliding board for wc<>drop arm commode transfer with SBA. Baseline: Eval: Currently using electric lift for transfer to [facility]; 09/28/23: Not yet completed at home but transfers to commode have been easier without sliding board d/t pt implementing a scoot pivot. Goal status: d/c (pt does not prefer sliding board for commode transfer)  3.  Pt will be indep to perform HEP for maintaining BUE strength for ADLs and functional transfers. Baseline: Eval: HEP not yet initiated; 09/28/23: Pt is working on core stability exercises from wc, cane stretches for bilat shoulder IR, and tricep extension via wc pushups; 11/13/23: indep Goal status: achieved  LONG TERM GOALS: Target date: 04/29/24  Pt will perform sink bath with set up A. (Revised on 11/13/23 from min A to set up) Baseline: Eval: Currently bed bathing d/t inability to stand at sink without electric lift (lift does not fit into pt's bathroom); 09/28/23: still completing bed bath as pt is not yet able to stand; 11/13/23: Not yet attempted; pt encouraged to attempt before next OT session now that bed bath goal has been met; 12/20/23: Pt is now completing sponge bath with min A while seated EOB (caregiver assists with washing peri area d/t no bed rail to aid in R lateral lean); 01/24/24: Performing with set up on EOB, completing lateral weight shifts to wash posterior/peri area with wc in front of pt and bed rail for LUE support and foot of bed for RUE support  Goal status: achieved  2.  Pt will perform wc<>tub bench transfer with min A. Baseline: Eval: Unable; 09/28/23: Performed in OT clinic with min A, but not yet in the home Goal status: d/c (pt prefers sink bath)  3.  Pt will perform squat pivot transfer wc<>BSC with SBA.  Baseline: Eval: Eval: Currently using electric lift for transfer to [facility]; 09/28/23: Not yet completed in clinic with [facility], and using lift for [facility] in the home still; 11/13/23: Still using Camie lift at home and in clinic transfers are all scoot  rather than squat pivots d/t LE weakness; 12/20/23: Pt reports that she plans to use her Sara  lift until she can ambulate to her [facility] d/t limited space for wc in her desired location for [facility] at home. Goal status: d/c  4. Pt will perform scoot transfer wc<>toilet using grab bars with supv to allow toileting in community setting.  Baseline: Eval: Pt limits community outings d/t requiring use of lift for [facility] transfers; 09/28/23: Pt has performed 2 successful scoot pivot  Transfers to toilet in OT clinic with min A, extra time.  Further trials with clothing management on toilet needed, but pt can lower and hike  pants while sititng edge of mat via lateral leaning and min A to maintain core stability; 11/13/23: Pt performs with CGA and extra time; 12/19/23: Pt  performs with close supv on most attempts, occasional CGA; efficiency is improving; 01/24/24: ongoing improvements; able to complete in fewer scoots (3 scoots each way from wc<>toilet) d/t improve engagement of Les and improved anterior weight shift; occasional min guard-supv; pt completes with therapist in clinic, but not yet in other community settings with sister, and not yet voided on toilet in community. 02/28/24: has not trialed in community, completes with SBA; 04/10/24: deferred in recent visits to focus on standing for sink bath, per pt request    Goal status: ongoing  5.  Pt will complete seated clothing management with min A to enable voiding in handicapped stall within community. Baseline: Recert 11/13/23: Pt can transfer to toilet with increased time and effort, but has not yet attempted clothing management or voiding while seated on commode in community setting.  Pt can lower pants to knees while seated in wc, but requires mod A to hike over hips from seated position; 12/20/23: Pt fully lowers panties and shorts while seated on commode with supv, can hike clothing to her her upper thighs, transfers back to wc, and then performs a tricep extension  from wc level to allow caregiver to hike clothing remainder of the way for purposes of EC (min A for clothing management component) Goal status: achieved  6.  Pt will tolerate standing x1 min with close supv and BUE support to manage clothing in prep for toileting. Baseline: Eval: Currently standing in electric lift only, or within parallel bars without lift; 09/28/23: Not yet attempted; 11/13/23: Pt is able to perform static standing with heavy BUE support on parallel bars with PT; 12/19/22: Not attempted recently d/t new intermittent clonus in BLEs; 01/24/24: Tolerated standing 1 min today at sink with min A for ascent, OT assist to block R knee from buckling. 02/28/24: tolerates up to 1 min standing, difficulty achieving stand 2/2 fatigue from PT; 04/10/24: Pt is inconsistent to achieve full standing position, but when able, can tolerate 1-2 min with CGA-min A and RW for support.  Pt was able to remove R hand briefly from walker (~3 sec or less) to touch R buttocks to simulate reach for clothing management and peri care.  Goal status: in progress  7.  Pt will increase bilat grip strength in order to securely grasp walker sufficiently for functional transfers and ambulation.  Baseline: Recert 11/13/23: TBD (PT note read following OT session, indicating pt with difficulty securing items in hand);  11/15/23: Grip strength: R: 70 lbs, L: 63 lbs; 12/25/23: R 76 lbs, L 67 lbs; sufficient for functional transfers/amb  Goal status: achieved  8.  Pt will increase [facility] skills in order to manipulate small ADL supplies with reduced dropping as indicated by 20 sec or more improvement in 9 hole peg test. Baseline: Recert  11/13/23: 9 hole peg test TBD (PT note read following OT session, indicating pt with difficulty manipulating and securing items in hands); 11/15/23: Right: 25 sec, L 49 sec; 12/25/23: Right: 24 sec; Left: 3 trials: 54 sec, 1 min, 56 sec; 01/24/24: R: 26 sec, L: 50 sec. 02/28/24: L: 40 sec; 04/10/24: R 25 sec, L  32 sec   Goal status: in progress  ASSESSMENT: CLINICAL IMPRESSION: Pt unable to achieve full standing position today, but achieved ~75% of full standing on 3 trials, given mod A to block R knee and mod vc for anterior pelvic tilt.  Additional focus on anterior pelvic tilt to work towards squat pivot transfers wc<>mat table x4 trials total.  With sliding board beneath pt for guarding, pt still completed scoot transfers (not yet squat pivot), but with slight clearance of buttocks on 1 or 2 lifts within each transfer, and still requiring multiple mini scoots to complete wc<>mat transfer (at least 5 scoots within each transfer).  Pt continues to rely primarily on BUEs for lifting d/t LE weakness, but is beginning to engage quads some with vc/tc.  Pt will continue to benefit from skilled OT to work towards above noted goals in OT poc, working to maximize indep with daily tasks while reducing burden of care on caregivers.   PERFORMANCE DEFICITS: in functional skills including ADLs, IADLs, strength, pain, flexibility, Gross motor control, mobility, balance, body mechanics, endurance, and decreased knowledge of use of DME, and psychosocial skills including coping strategies, environmental adaptation, habits, and routines and behaviors.   IMPAIRMENTS: are limiting patient from ADLs, IADLs, and social participation.   CO-MORBIDITIES: has co-morbidities such as neuropathy, back pain, obesity, MS, HTN that affects occupational performance. Patient will benefit from skilled OT to address above impairments and improve overall function.  MODIFICATION OR ASSISTANCE TO COMPLETE EVALUATION: No modification of tasks or assist necessary to complete an evaluation.  OT OCCUPATIONAL PROFILE AND HISTORY: Detailed assessment: Review of records and additional review of physical, cognitive, psychosocial history related to current functional performance.  CLINICAL DECISION MAKING: Moderate - several treatment options, min-mod  task modification necessary  REHAB POTENTIAL: Good  EVALUATION COMPLEXITY: Moderate  PLAN:  OT FREQUENCY: 2x/week  OT DURATION: 12 weeks  PLANNED INTERVENTIONS: 97168 OT Re-evaluation, 97535 self care/ADL training, 02889 therapeutic exercise, 97530 therapeutic activity, 97112 neuromuscular re-education, 97140 manual therapy, 97116 gait training, 02989 moist heat, 97010 cryotherapy, balance training, functional mobility training, psychosocial skills training, energy conservation, coping strategies training, patient/family education, and DME and/or AE instructions  RECOMMENDED OTHER SERVICES: None at this time (Pt currently receiving PT services in this clinic)  CONSULTED AND AGREED WITH PLAN OF CARE: Patient  PLAN FOR NEXT SESSION: see above  Inocente Blazing, MS, OTR/L  Inocente MARLA Blazing, OT 04/20/2024, 7:45 PM

## 2024-04-21 ENCOUNTER — Ambulatory Visit: Admitting: Internal Medicine

## 2024-04-21 ENCOUNTER — Encounter: Payer: Self-pay | Admitting: Internal Medicine

## 2024-04-21 VITALS — BP 124/70 | HR 68 | Temp 98.1°F | Ht 71.0 in | Wt 244.0 lb

## 2024-04-21 DIAGNOSIS — E7800 Pure hypercholesterolemia, unspecified: Secondary | ICD-10-CM

## 2024-04-21 DIAGNOSIS — Z6832 Body mass index (BMI) 32.0-32.9, adult: Secondary | ICD-10-CM

## 2024-04-21 DIAGNOSIS — M8080XD Other osteoporosis with current pathological fracture, unspecified site, subsequent encounter for fracture with routine healing: Secondary | ICD-10-CM

## 2024-04-21 DIAGNOSIS — E66811 Obesity, class 1: Secondary | ICD-10-CM | POA: Diagnosis not present

## 2024-04-21 DIAGNOSIS — R739 Hyperglycemia, unspecified: Secondary | ICD-10-CM | POA: Diagnosis not present

## 2024-04-21 DIAGNOSIS — G35A Relapsing-remitting multiple sclerosis: Secondary | ICD-10-CM

## 2024-04-21 DIAGNOSIS — R7303 Prediabetes: Secondary | ICD-10-CM | POA: Diagnosis not present

## 2024-04-21 DIAGNOSIS — Z0000 Encounter for general adult medical examination without abnormal findings: Secondary | ICD-10-CM | POA: Diagnosis not present

## 2024-04-21 DIAGNOSIS — R7989 Other specified abnormal findings of blood chemistry: Secondary | ICD-10-CM

## 2024-04-21 DIAGNOSIS — I10 Essential (primary) hypertension: Secondary | ICD-10-CM

## 2024-04-21 MED ORDER — LEVOTHYROXINE SODIUM 88 MCG PO TABS: 88.0000 ug | ORAL_TABLET | Freq: Every day | ORAL | 1 refills | Status: AC

## 2024-04-21 MED ORDER — OXYBUTYNIN CHLORIDE ER 10 MG PO TB24: 10.0000 mg | ORAL_TABLET | Freq: Every day | ORAL | 1 refills | Status: AC

## 2024-04-21 MED ORDER — TELMISARTAN 40 MG PO TABS: 40.0000 mg | ORAL_TABLET | Freq: Every day | ORAL | 1 refills | Status: AC

## 2024-04-21 NOTE — Progress Notes (Unsigned)
 Subjective:    Patient ID: Lisa Williams, female    DOB: 1961-05-01, 63 y.o.   MRN: [id number]  Patient here for  Chief Complaint  Patient presents with   Annual Exam    HPI Here for a physical exam. Saw D Sater 04/16/24 - follow up MS. Continues on baclofen  and tizandien for spasticity. Tolerated mavenclad. Plans for second year of mavenclad aroun 5-10/2024. Continue PT - previous leg fracture. Continues on micardis  and crestor. Needs f/u bone density.    Past Medical History:  Diagnosis Date   Abdominal pain, right upper quadrant    Back pain    Calculus of kidney 12/09/2013   Chronic back pain    unspecified   Chronic left shoulder pain 07/19/2015   Complication of anesthesia    Functional disorder of bladder    other   Galactorrhea 11/26/2014   Chronic    Hereditary and idiopathic neuropathy 08/19/2013   High cholesterol    History of kidney stones    HPV test positive    Hypercholesteremia 08/19/2013   Hypertension    Hypertension    Hypothyroidism    Incomplete bladder emptying    Microscopic hematuria    MS (multiple sclerosis)    Muscle spasticity 05/21/2014   Nonspecific findings on examination of urine    other   Osteopenia    PONV (postoperative nausea and vomiting)    Prediabetes    Sickle cell trait    Status post laparoscopic supracervical hysterectomy 11/26/2014   Tobacco user 11/26/2014   Wrist fracture    Past Surgical History:  Procedure Laterality Date   bilateral tubal ligation  1996   BREAST CYST EXCISION Left 2002   CYST EXCISION Left 05/10/2022   Procedure: CYST REMOVAL;  Surgeon: Rodolph Romano, MD;  Location: [facility];  Service: General;  Laterality: Left;   FRACTURE SURGERY     KNEE SURGERY     right   LAPAROSCOPIC SUPRACERVICAL HYSTERECTOMY  08/05/2013   ORIF WRIST FRACTURE Left 01/17/2017   Procedure: OPEN REDUCTION INTERNAL FIXATION (ORIF) WRIST FRACTURE;  Surgeon: Leora Lynwood SAUNDERS, MD;  Location: [facility];   Service: Orthopedics;  Laterality: Left;   RADIOLOGY WITH ANESTHESIA N/A 03/18/2020   Procedure: MRI WITH ANESTHESIA CERVICAL SPINE AND BRAIN  WITH AND WITHOUT CONTRAST;  Surgeon: Radiologist, Medication, MD;  Location: [facility];  Service: Radiology;  Laterality: N/A;   right tibia fracture Right    2024   TUBAL LIGATION Bilateral    VAGINAL HYSTERECTOMY  03/2006   VAGINAL HYSTERECTOMY     2014   Family History  Problem Relation Age of Onset   Heart disease Mother    Osteoarthritis Mother    Hypertension Mother    Hyperlipidemia Mother    Diabetes Father    Heart disease Father    Cancer Father    Hyperlipidemia Father    Nephrolithiasis Father    Hypertension Father    Prostate cancer Father    Kidney disease Father    Heart failure Father    Sickle cell trait Sister    Hypertension Sister    Supraventricular tachycardia Sister    Hypertension Sister    Hypertension Sister    Diabetes Maternal Aunt    Breast cancer Maternal Aunt        great MAT   Diabetes Maternal Grandmother    Liver cancer Maternal Grandmother    Ovarian cancer Paternal Grandmother    Multiple sclerosis Cousin    GU  problems Neg Hx    Urolithiasis Neg Hx    Social History   Socioeconomic History   Marital status: Divorced    Spouse name: Not on file   Number of children: 0   Years of education: 14   Highest education level: Associate degree: occupational, scientist, product/process development, or vocational program  Occupational History   Occupation: retired  Tobacco Use   Smoking status: Former    Current packs/day: 0.50    Types: Cigarettes   Smokeless tobacco: Never  Vaping Use   Vaping status: Never Used  Substance and Sexual Activity   Alcohol use: Never   Drug use: Never   Sexual activity: Not Currently    Birth control/protection: Surgical, None  Other Topics Concern   Not on file  Social History Narrative   Lives at home with mom and sister,. Has a walker for ambulation/uses WC when necessary   Right  Handed   Drinks no caffeine   Pt not working    Social Drivers of Health   Tobacco Use: Medium Risk (04/21/2024)   Patient History    Smoking Tobacco Use: Former    Smokeless Tobacco Use: Never    Passive Exposure: Not on file  Financial Resource Strain: Low Risk (04/17/2024)   Overall Financial Resource Strain (CARDIA)    Difficulty of Paying Living Expenses: Not hard at all  Food Insecurity: No Food Insecurity (04/17/2024)   Epic    Worried About Programme Researcher, Broadcasting/film/video in the Last Year: Never true    Ran Out of Food in the Last Year: Never true  Transportation Needs: No Transportation Needs (04/17/2024)   Epic    Lack of Transportation (Medical): No    Lack of Transportation (Non-Medical): No  Physical Activity: Insufficiently Active (04/17/2024)   Exercise Vital Sign    Days of Exercise per Week: 2 days    Minutes of Exercise per Session: 50 min  Stress: No Stress Concern Present (04/17/2024)   [employer] of Occupational Health - Occupational Stress Questionnaire    Feeling of Stress: Not at all  Social Connections: Moderately Integrated (04/17/2024)   Social Connection and Isolation Panel    Frequency of Communication with Friends and Family: More than three times a week    Frequency of Social Gatherings with Friends and Family: Once a week    Attends Religious Services: More than 4 times per year    Active Member of [employer] or Organizations: Yes    Attends Banker Meetings: Patient declined    Marital Status: Divorced  Recent Concern: Social Connections - Socially Isolated (02/19/2024)   Social Connection and Isolation Panel    Frequency of Communication with Friends and Family: More than three times a week    Frequency of Social Gatherings with Friends and Family: More than three times a week    Attends Religious Services: Never    Database Administrator or Organizations: No    Attends Banker Meetings: Never    Marital Status: Divorced   Depression (PHQ2-9): Low Risk (04/21/2024)   Depression (PHQ2-9)    PHQ-2 Score: 0  Alcohol Screen: Low Risk (02/19/2024)   Alcohol Screen    Last Alcohol Screening Score (AUDIT): 0  Housing: Unknown (04/17/2024)   Epic    Unable to Pay for Housing in the Last Year: Not on file    Number of Times Moved in the Last Year: Not on file    Homeless in the Last Year: No  Utilities: Not At Risk (02/19/2024)   Epic    Threatened with loss of utilities: No  Health Literacy: Adequate Health Literacy (02/19/2024)   B1300 Health Literacy    Frequency of need for help with medical instructions: Never     Review of Systems     Objective:     BP 124/70   Pulse 68   Temp 98.1 F (36.7 C) (Oral)   Wt 244 lb (110.7 kg) Comment: pt reported  SpO2 98%   BMI 34.03 kg/m  Wt Readings from Last 3 Encounters:  04/21/24 244 lb (110.7 kg)  03/26/24 242 lb (109.8 kg)  03/12/24 236 lb (107 kg)    Physical Exam  {Perform Simple Foot Exam  Perform Detailed exam:1} {Insert foot Exam (Optional):30965}   Outpatient Encounter Medications as of 04/21/2024  Medication Sig   acetaminophen  (TYLENOL ) 500 MG tablet Take 500-1,000 mg by mouth every 6 (six) hours as needed for moderate pain or headache.   baclofen  (LIORESAL ) 20 MG tablet Take 1 tablet (20 mg total) by mouth 4 (four) times daily.   Cholecalciferol  25 MCG (1000 UT) tablet Take 5,000 Units by mouth daily.   levothyroxine  (SYNTHROID ) 88 MCG tablet Take 1 tablet (88 mcg total) by mouth daily.   MAVENCLAD, 10 TABS, 10 MG TBPK Take by mouth.   meloxicam  (MOBIC ) 15 MG tablet Take 1 tablet (15 mg total) by mouth daily.   oxybutynin  (DITROPAN -XL) 10 MG 24 hr tablet Take 1 tablet (10 mg total) by mouth daily.   rosuvastatin (CRESTOR) 10 MG tablet Take 10 mg by mouth at bedtime.   telmisartan  (MICARDIS ) 40 MG tablet Take 1 tablet (40 mg total) by mouth daily.   tiZANidine  (ZANAFLEX ) 4 MG tablet One po bid   No facility-administered encounter  medications on file as of 04/21/2024.     Lab Results  Component Value Date   WBC 6.2 01/09/2024   HGB 12.5 01/09/2024   HCT 38.0 01/09/2024   PLT 216.0 01/09/2024   GLUCOSE 80 03/12/2024   CHOL 136 01/09/2024   TRIG 73.0 01/09/2024   HDL 43.20 01/09/2024   LDLCALC 78 01/09/2024   ALT 12 03/12/2024   AST 14 03/12/2024   NA 142 03/12/2024   K 4.5 03/12/2024   CL 106 03/12/2024   CREATININE 0.66 03/12/2024   BUN 22 03/12/2024   CO2 23 03/12/2024   TSH 5.100 (H) 04/08/2024   INR 1.1 11/14/2021   HGBA1C 6.1 01/09/2024    No results found.     Assessment & Plan:  Health care maintenance Assessment & Plan: Physical today 04/21/24. PAP today. Scheduled 05/19/24 for bone density and mammogram. Needs colonoscopy.    Hypercholesteremia  Essential hypertension  Abnormal LFTs- Normal RUQ U/S 09/28/2023  Prediabetes  Class 1 obesity with serious comorbidity and body mass index (BMI) of 32.0 to 32.9 in adult, unspecified obesity type     Allena Hamilton, MD

## 2024-04-21 NOTE — Assessment & Plan Note (Signed)
 Physical today 04/21/24. PAP today. Scheduled 05/19/24 for bone density and mammogram. Needs colonoscopy.

## 2024-04-22 ENCOUNTER — Ambulatory Visit: Payer: PPO

## 2024-04-22 ENCOUNTER — Ambulatory Visit

## 2024-04-22 DIAGNOSIS — R262 Difficulty in walking, not elsewhere classified: Secondary | ICD-10-CM

## 2024-04-22 DIAGNOSIS — M6281 Muscle weakness (generalized): Secondary | ICD-10-CM

## 2024-04-22 DIAGNOSIS — G35D Multiple sclerosis, unspecified: Secondary | ICD-10-CM

## 2024-04-22 DIAGNOSIS — R2681 Unsteadiness on feet: Secondary | ICD-10-CM

## 2024-04-22 LAB — BASIC METABOLIC PANEL WITH GFR
BUN: 26 mg/dL — ABNORMAL HIGH (ref 6–23)
CO2: 28 meq/L (ref 19–32)
Calcium: 9.7 mg/dL (ref 8.4–10.5)
Chloride: 105 meq/L (ref 96–112)
Creatinine, Ser: 0.54 mg/dL (ref 0.40–1.20)
GFR: 98.61 mL/min (ref >60.00)
Glucose, Bld: 85 mg/dL (ref 70–99)
Potassium: 4.3 meq/L (ref 3.5–5.1)
Sodium: 140 meq/L (ref 135–145)

## 2024-04-22 LAB — HEMOGLOBIN A1C: Hgb A1c MFr Bld: 5.3 % (ref 4.6–6.5)

## 2024-04-22 LAB — LIPID PANEL
Cholesterol: 154 mg/dL (ref 28–200)
HDL: 49.7 mg/dL (ref >39.00)
LDL Cholesterol: 92 mg/dL (ref 0–99)
NonHDL: 103.81
Total CHOL/HDL Ratio: 3
Triglycerides: 57 mg/dL (ref 0.0–149.0)
VLDL: 11.4 mg/dL (ref 0.0–40.0)

## 2024-04-22 LAB — HEPATIC FUNCTION PANEL
ALT: 12 U/L (ref 0–35)
AST: 13 U/L (ref 5–37)
Albumin: 4.3 g/dL (ref 3.5–5.2)
Alkaline Phosphatase: 134 U/L — ABNORMAL HIGH (ref 39–117)
Bilirubin, Direct: 0.1 mg/dL (ref 0.0–0.3)
Total Bilirubin: 0.4 mg/dL (ref 0.2–1.2)
Total Protein: 6.9 g/dL (ref 6.0–8.3)

## 2024-04-22 NOTE — Therapy (Signed)
 OUTPATIENT PHYSICAL THERAPY NEURO TREATMENT   Patient Name: Lisa Williams: [id number] DOB:1960/06/14, 63 y.o., female Today's Date: 04/22/2024  PCP: Lenon Na  REFERRING PROVIDER: Lenon Na   END OF SESSION:  PT End of Session - 04/22/24 1059     Visit Number 77    Number of Visits 94    Date for Recertification  06/12/24   corrected   Progress Note Due on Visit 50    PT Start Time 1015    PT Stop Time 1058    PT Time Calculation (min) 43 min    Equipment Utilized During Treatment Gait belt    Activity Tolerance Patient tolerated treatment well;No increased pain    Behavior During Therapy [facility] for tasks assessed/performed          Past Medical History:  Diagnosis Date   Abdominal pain, right upper quadrant    Back pain    Calculus of kidney 12/09/2013   Chronic back pain    unspecified   Chronic left shoulder pain 07/19/2015   Complication of anesthesia    Functional disorder of bladder    other   Galactorrhea 11/26/2014   Chronic    Hereditary and idiopathic neuropathy 08/19/2013   High cholesterol    History of kidney stones    HPV test positive    Hypercholesteremia 08/19/2013   Hypertension    Hypertension    Hypothyroidism    Incomplete bladder emptying    Microscopic hematuria    MS (multiple sclerosis)    Muscle spasticity 05/21/2014   Nonspecific findings on examination of urine    other   Osteopenia    PONV (postoperative nausea and vomiting)    Prediabetes    Sickle cell trait    Status post laparoscopic supracervical hysterectomy 11/26/2014   Tobacco user 11/26/2014   Wrist fracture    Past Surgical History:  Procedure Laterality Date   bilateral tubal ligation  1996   BREAST CYST EXCISION Left 2002   CYST EXCISION Left 05/10/2022   Procedure: CYST REMOVAL;  Surgeon: Rodolph Romano, MD;  Location: [facility] ORS;  Service: General;  Laterality: Left;   FRACTURE SURGERY     KNEE SURGERY     right    LAPAROSCOPIC SUPRACERVICAL HYSTERECTOMY  08/05/2013   ORIF WRIST FRACTURE Left 01/17/2017   Procedure: OPEN REDUCTION INTERNAL FIXATION (ORIF) WRIST FRACTURE;  Surgeon: Leora Lynwood SAUNDERS, MD;  Location: [facility] OR[facility];  Service: Orthopedics;  Laterality: Left;   RADIOLOGY WITH ANESTHESIA N/A 03/18/2020   Procedure: MRI WITH ANESTHESIA CERVICAL SPINE AND BRAIN  WITH AND WITHOUT CONTRAST;  Surgeon: Radiologist, Medication, MD;  Location: [facility] OR;  Service: Radiology;  Laterality: N/A;   right tibia fracture Right    2024   TUBAL LIGATION Bilateral    VAGINAL HYSTERECTOMY  03/2006   VAGINAL HYSTERECTOMY     2014   Patient Active Problem List   Diagnosis Date Noted   Health care maintenance 04/21/2024   SOB (shortness of breath) on exertion 03/12/2024   Hyperglycemia 01/09/2024   Abnormal LFTs (liver function tests) 09/17/2023   Sinus tachycardia 07/07/2023   Aortic atherosclerosis 01/18/2023   Prediabetes 01/18/2023   Hypokalemia 07/29/2022   Weakness of both lower extremities 07/29/2022   Abscess of left groin 11/15/2021   Abnormal LFTs 11/15/2021   Acute respiratory disease due to COVID-19 virus 11/21/2020   Fatigue due to excessive exertion    Hypoalbuminemia due to protein-calorie malnutrition    Neurogenic bowel  Neurogenic bladder    Labile blood pressure    Neuropathic pain    Multiple sclerosis, relapsing-remitting 07/09/2019   Abscess of female pelvis    SVT (supraventricular tachycardia)    Radial styloid tenosynovitis 03/12/2018   Wheelchair dependence 02/27/2018   Localized osteoporosis with current pathological fracture with routine healing 01/19/2017   Wrist fracture 01/16/2017   Sprain of ankle 03/23/2016   Closed fracture of lateral malleolus 03/16/2016   Depression screening 01/24/2016   Blood pressure elevated without history of HTN 10/25/2015   Essential hypertension 10/25/2015   Multiple sclerosis 10/02/2015   Chronic left shoulder pain 07/19/2015   Multiple  sclerosis exacerbation 07/14/2015   MS (multiple sclerosis) 11/26/2014   Increased body mass index 11/26/2014   HPV test positive 11/26/2014   Status post laparoscopic supracervical hysterectomy 11/26/2014   Galactorrhea 11/26/2014   Back ache 05/21/2014   Adiposity 05/21/2014   Disordered sleep 05/21/2014   Muscle spasticity 05/21/2014   Spasticity 05/21/2014   Calculus of kidney 12/09/2013   Renal colic 12/09/2013   Hypercholesteremia 08/19/2013   Hereditary and idiopathic neuropathy 08/19/2013   Hypercholesterolemia without hypertriglyceridemia 08/19/2013   Bladder infection, chronic 07/25/2012   Disorder of bladder function 07/25/2012   Incomplete bladder emptying 07/25/2012   Microscopic hematuria 07/25/2012   Right upper quadrant pain 07/25/2012    ONSET DATE: 1995  REFERRING DIAG: MS  THERAPY DIAG:  Muscle weakness (generalized)  Multiple sclerosis exacerbation  Difficulty in walking, not elsewhere classified  Unsteadiness on feet  Rationale for Evaluation and Treatment: Rehabilitation  SUBJECTIVE:                                                                                                                                                                                             SUBJECTIVE STATEMENT:  Patient reports feeling good today, has been delivering presents.   Pt accompanied by: self  PERTINENT HISTORY:  Patient is returning to PT s/p hospitalization.  s/p ORIF of R tibia shaft fracture 09/23/2022. Patient has weakness in BLE with RLE>LLE. She drives with hand controls. Patient has been diagnosed with MS in 1995. PMH includes: back pain, CBP, chronic L shoulder pain, galactorrhea, neuropathy, HPV, hypercholestermeia, HTN, MS, osteopenia, PONV, wrist fracture. Additional order for other closed fracture of proximal end of R tibia with routine healing. Still has to use [employer].   PAIN:  Are you having pain? Occasional pain in RLE; primarily in  knee  PRECAUTIONS: Fall  RED FLAGS: None   WEIGHT BEARING RESTRICTIONS: No  FALLS: Has patient fallen in last 6 months? No  LIVING ENVIRONMENT: Lives with: lives with  their family Lives in: House/apartment Stairs: ramp Has following equipment at home: Vannie - 2 wheeled, Wheelchair (manual), shower chair, and Grab bars  PLOF: Independent with household mobility with device  PATIENT GOALS: to get her independence back. To be able to get into car, toilet, and get dressed independently  OBJECTIVE:  Note: Objective measures were completed at Evaluation unless otherwise noted.  DIAGNOSTIC FINDINGS: MRI of the brain 03/18/2020 showed multiple T2/FLAIR hyperintense foci in the periventricular, juxtacortical and deep white matter.  There were no infratentorial lesions noted.  None of the foci enhanced.   Due to severe claustrophobia, the study was done with conscious sedation in the hospital   COGNITION: Overall cognitive status: Within functional limits for tasks assessed   SENSATION: Lack of sensation in feet, loss in bilateral lateral aspect of knee  COORDINATION: Does not have the strength for functional LE heel slide test   MUSCLE TONE: BLE mild tone    POSTURE: rounded shoulders, forward head, anterior pelvic tilt, and weight shift left   LOWER EXTREMITY MMT:    MMT Right Eval Left Eval  Hip flexion 0.9 1.5  Hip extension    Hip abduction 1.8 2.6  Hip adduction 3.1 1.9  Hip internal rotation    Hip external rotation    Knee flexion 0.9 1.5  Knee extension 0.8 1.2  Ankle dorsiflexion    Ankle plantarflexion    Ankle inversion    Ankle eversion    (Blank rows = not tested)  BED MOBILITY:  Assess in future session due to limited time  TRANSFERS: Assistive device utilized: Bariatric RW with sit to stands, slide board to table  Sit to stand: unable to reach full stand Stand to sit: unable to reach full stand  Chair to chair: slide board with CGA    GAIT: Unable to ambulate at this time.   FUNCTIONAL TESTS:  Sit to stand: tricep press with BUE; unable to bring arm to walker.    Function In Sitting Test (FIST)  (1/2 femur on surface; hips/knees flexed to 90deg)   - indicate bed or mat table / step stool if used  SCORING KEY: 4 = Independent (completes task independently & successfully) 3 = Verbal Cues/Increased Time (completes task independently & successfully and only needs more time/cues) 2 = Upper Extremity Support (must use UE for support or assistance to complete successfully) 1 = Needs Assistance (unable to complete w/o physical assist; DOCUMENT LEVEL: min, mod, max) 0 = Dependent (requires complete physical assist; unable to complete successfully even w/ physical assist)  Randomly Administer Once Throughout Exam  4 - Anterior Nudge (superior sternum)  4 - Posterior Nudge (between scapular spines)  4 - Lateral Nudge (to dominant side at acromion)     4 - Static sitting (30 seconds)  4 - Sitting, shake no (left and right)  4 - Sitting, eyes closed (30 seconds)   0 - Sitting, lift foot (dominant side, lift foot 1 inch twice)    2 - Pick up object from behind (object at midline, hands breadth posterior)  3 - Forward reach (use dominant arm, must complete full motion) 2 - Lateral reach (use dominant arm, clear opposite ischial tuberosity) 2 - Pick up object from floor (from between feet)   2 - Posterior scooting (move backwards 2 inches)  2 - Anterior scooting (move forward 2 inches)  2 - Lateral scooting (move to dominant side 2 inches)    TOTAL = 39/56  Notes/comments: slide board tranfer  to/from table   MCD > 5 points MCID for IP REHAB > 6 points  Function In Sitting Test (FIST) 08/14/23 (1/2 femur on surface; hips/knees flexed to 90deg)   - indicate bed or mat table / step stool if used  SCORING KEY: 4 = Independent (completes task independently & successfully) 3 = Verbal Cues/Increased Time (completes  task independently & successfully and only needs more time/cues) 2 = Upper Extremity Support (must use UE for support or assistance to complete successfully) 1 = Needs Assistance (unable to complete w/o physical assist; DOCUMENT LEVEL: min, mod, max) 0 = Dependent (requires complete physical assist; unable to complete successfully even w/ physical assist)  Randomly Administer Once Throughout Exam  4 - Anterior Nudge (superior sternum)  4 - Posterior Nudge (between scapular spines)  4 - Lateral Nudge (to dominant side at acromion)     4 - Static sitting (30 seconds)  4 - Sitting, shake no (left and right)  4 - Sitting, eyes closed (30 seconds)   0 - Sitting, lift foot (dominant side, lift foot 1 inch twice)    4 - Pick up object from behind (object at midline, hands breadth posterior)  4 - Forward reach (use dominant arm, must complete full motion) 4 - Lateral reach (use dominant arm, clear opposite ischial tuberosity)  - Pick up object from floor (from between feet)   3 - Posterior scooting (move backwards 2 inches)  3 - Anterior scooting (move forward 2 inches)  3 - Lateral scooting (move to dominant side 2 inches)    TOTAL = 47/56  MCD > 5 points MCID for IP REHAB > 6 points   TREATMENT DATE: 04/22/2024   Gait:   Pt ambulated for a total of 2 attempts, 26.7' on first attempt, unable to complete full stand second attempt with buckling of LE's requiring PT max A to chair to avoid collapse.    Pt utilized the bariatric FWW, pillow under the thighs for easier ability to come upright, and sock applied to the L foot to reduce friction of the foot in mobility.    pt's symptoms monitored throughout the session.      TherEx: To improve strength, endurance, mobility, and function of specific targeted muscle groups or improve joint range of motion or improve muscle flexibility  Hip abduction 15x isometric contraction into PT legs  Hip adduction 15x isometric contraction into PT  legs   TherAct:  Seated partial sit to stand 5x with focus on LE activation     PATIENT EDUCATION:  Education details: Pt educated throughout session about proper posture and technique with exercises. Improved exercise technique, movement at target joints, use of target muscles after min to mod verbal, visual, tactile cues.  Person educated: Patient Education method: Explanation, Demonstration, Tactile cues, and Verbal cues Education comprehension: verbalized understanding, returned demonstration, verbal cues required, tactile cues required, and needs further education    HOME EXERCISE PROGRAM: Stand 3x/day in stander   GOALS: Goals reviewed with patient? Yes  SHORT TERM GOALS: Target date: 08/08/2023  Patient will be independent in home exercise program to improve strength/mobility for better functional independence with ADLs.  Baseline: 4/8: compliance Goal status: MET    LONG TERM GOALS: Target date: 03/20/2024  Patient will tolerate five consecutive stands with UE support from wheelchair to standing to improve functional mobility with additional cushion and RW.  Baseline: unable to perform 4/8:unable to perform full stand 5/20: perform next session 5/29: two full stands with CGA  and cushion on her seat 7/31: able to stand 3 times from [facility]. With no addition cushion  in parallel bars. 12/27/2023= Will retest next session- concentrated on just standing on past 2 visits 03/20/24:  Goal status: Ongoing   2.  Patient will ambulate 10 ft with bRW with wheelchair follow.  Baseline:  unable to ambulate 4/8: unable to ambulate 5/20: able to ambulate length of // bars in previous session 5/27: ambulate 2.5 lengths of // bars with two seated rest breaks with min A for weight shift and wheelchair follow  7/31: able to ambulate in parallel bars 3 ft with min assist and +2 follow in WC> difficulty advancing the LLE on this day. 12/27/2023- Patient unable to take a step forward today but was  abel to take a step on previous visit- limited by clonus like trembling that has progressed over past month. 03/20/24: 26' at one time Goal status: MET   3.  Patient will improve FIST score >6 points to demonstrate improved stability and ability to perform ADLs.  Baseline:  3/6: 39/56 4/8: 47/ 56 5/20: 48/56 7/31:46/56 Goal status: MET  4.  Patient will perform toileting with mod I at home for improved independence.  Baseline: 3/6: requires assistance 4/8: requires dependence on machine 5/20: requires assistance with use of wipe buddy.  7/31: continues to require use of sara steady and wipe buddy for safety and hygiene. 12/27/2023- Patient reports that since she is unable to take steps that she continues to require use of sara steady and wipe buddy for safe personal hygiene. 03/20/24: Pt utilizing sara stedy to perform safe personal hygiene, however is no longer utilizing the wipe buddy. Goal status: PROGRESSING   5. Patient will perform > 5 min of static or dynamic standing with BUE and Min/mod assist  for improved LE strength and pre-gait function to assist with toileting and overall standing ability for improve functional independence.  Baseline: 12/27/2023-Patient was able to stand for just over 3 min today. 01/10/24: Pt able to stand 1 min 38 sec 02/14/24: Pt able to >3 minutes at this time. 03/20/24: Pt able to achieve >4 minutes at this time. Goal status: PROGRESSING  6.  Patient will ambulate 40 ft with BRW with wheelchair follow, in one attempt, without rest break. Baseline: 49' during one attempt. Goal status: NEW   ASSESSMENT:  CLINICAL IMPRESSION:  Patient ambulated longest duration to date at this time with use of BrW, sock on LLE, and wheelchair follow. Patient attempted second ambulation duration unsuccessfully with LLE collapse requiring max A to avoid LOB. Patient remains highly motivated and eager to progress functional mobility.  Pt will continue to benefit from skilled  therapy to address remaining deficits in order to improve overall QoL and return to PLOF.        OBJECTIVE IMPAIRMENTS: Abnormal gait, cardiopulmonary status limiting activity, decreased activity tolerance, decreased balance, decreased coordination, decreased endurance, decreased mobility, difficulty walking, decreased ROM, decreased strength, hypomobility, increased fascial restrictions, impaired perceived functional ability, impaired flexibility, impaired sensation, improper body mechanics, and postural dysfunction.   ACTIVITY LIMITATIONS: carrying, lifting, bending, sitting, standing, squatting, sleeping, stairs, transfers, bed mobility, continence, bathing, toileting, dressing, self feeding, reach over head, hygiene/grooming, locomotion level, and caring for others  PARTICIPATION LIMITATIONS: meal prep, cleaning, laundry, interpersonal relationship, driving, shopping, community activity, and church  PERSONAL FACTORS: Age, Past/current experiences, Time since onset of injury/illness/exacerbation, Transportation, and 3+ comorbidities: ack pain, CBP, chronic L shoulder pain, galactorrhea, neuropathy, HPV, hypercholestermeia, HTN, MS, osteopenia,  PONV, wrist fracture are also affecting patient's functional outcome.   REHAB POTENTIAL: Good  CLINICAL DECISION MAKING: Evolving/moderate complexity  EVALUATION COMPLEXITY: Moderate  PLAN:  PT FREQUENCY: 2x/week  PT DURATION: 12 weeks  PLANNED INTERVENTIONS: 97164- PT Re-evaluation, 97110-Therapeutic exercises, 97530- Therapeutic activity, 97112- Neuromuscular re-education, 97535- Self Care, [id number]- Manual therapy, [phone]- Gait training, [phone]- Orthotic Fit/training, [phone]- Canalith repositioning, [id number]- Aquatic Therapy, [phone]- Electrical stimulation (unattended), [phone]- Electrical stimulation (manual), [id number]- Ultrasound, [id number]- Traction (mechanical), Patient/Family education, Balance training, Stair training, Taping, Dry Needling, Joint  mobilization, Joint manipulation, Spinal manipulation, Spinal mobilization, Scar mobilization, Compression bandaging, Vestibular training, Visual/preceptual remediation/compensation, Cognitive remediation, DME instructions, Cryotherapy, Moist heat, and Biofeedback  PLAN FOR NEXT SESSION:  Strap stand frame with more dynamic activities Sit to stands; continue supported walking, continue dynamic core stabilization training and seated reaching tasks to improve weight shifting.  Continue with ambulation in the hallway with the bariatric walker, sock, and use of cushion for easier lift off.   Fredric Slabach  Leopoldo, PT, DPT Physical Therapist - [facility]  Outpatient Physical Therapy- Main Campus [phone]    04/22/2024, 12:37 PM

## 2024-04-23 ENCOUNTER — Other Ambulatory Visit: Payer: Self-pay | Admitting: Internal Medicine

## 2024-04-23 ENCOUNTER — Encounter (INDEPENDENT_AMBULATORY_CARE_PROVIDER_SITE_OTHER): Payer: Self-pay | Admitting: Nurse Practitioner

## 2024-04-23 ENCOUNTER — Ambulatory Visit (INDEPENDENT_AMBULATORY_CARE_PROVIDER_SITE_OTHER): Admitting: Nurse Practitioner

## 2024-04-23 ENCOUNTER — Ambulatory Visit (INDEPENDENT_AMBULATORY_CARE_PROVIDER_SITE_OTHER)

## 2024-04-23 ENCOUNTER — Ambulatory Visit: Payer: Self-pay | Admitting: Internal Medicine

## 2024-04-23 VITALS — BP 128/69 | HR 58 | Temp 98.0°F | Ht 71.0 in | Wt 239.0 lb

## 2024-04-23 DIAGNOSIS — Z6833 Body mass index (BMI) 33.0-33.9, adult: Secondary | ICD-10-CM

## 2024-04-23 DIAGNOSIS — R7303 Prediabetes: Secondary | ICD-10-CM

## 2024-04-23 DIAGNOSIS — R748 Abnormal levels of other serum enzymes: Secondary | ICD-10-CM

## 2024-04-23 DIAGNOSIS — E039 Hypothyroidism, unspecified: Secondary | ICD-10-CM | POA: Diagnosis not present

## 2024-04-23 DIAGNOSIS — I10 Essential (primary) hypertension: Secondary | ICD-10-CM | POA: Diagnosis not present

## 2024-04-23 DIAGNOSIS — E7800 Pure hypercholesterolemia, unspecified: Secondary | ICD-10-CM

## 2024-04-23 DIAGNOSIS — E66811 Obesity, class 1: Secondary | ICD-10-CM | POA: Diagnosis not present

## 2024-04-23 DIAGNOSIS — G35D Multiple sclerosis, unspecified: Secondary | ICD-10-CM

## 2024-04-23 LAB — GAMMA GT: GGT: 11 U/L (ref 7–51)

## 2024-04-23 NOTE — Therapy (Signed)
 OUTPATIENT PHYSICAL THERAPY NEURO TREATMENT   Patient Name: Lisa Williams MRN: [id number] DOB:[birth year], 63 y.o., female Today's Date: 04/24/2024  PCP: Lenon Na  REFERRING PROVIDER: Lenon Na   END OF SESSION:  PT End of Session - 04/24/24 1013     Visit Number 78    Number of Visits 94    Date for Recertification  06/12/24   corrected   Progress Note Due on Visit 50    PT Start Time 1015    PT Stop Time 1058    PT Time Calculation (min) 43 min    Equipment Utilized During Treatment Gait belt    Activity Tolerance Patient tolerated treatment well;No increased pain    Behavior During Therapy [facility] for tasks assessed/performed           Past Medical History:  Diagnosis Date   Abdominal pain, right upper quadrant    Back pain    Calculus of kidney 12/09/2013   Chronic back pain    unspecified   Chronic left shoulder pain 07/19/2015   Complication of anesthesia    Functional disorder of bladder    other   Galactorrhea 11/26/2014   Chronic    Hereditary and idiopathic neuropathy 08/19/2013   High cholesterol    History of kidney stones    HPV test positive    Hypercholesteremia 08/19/2013   Hypertension    Hypertension    Hypothyroidism    Incomplete bladder emptying    Microscopic hematuria    MS (multiple sclerosis)    Muscle spasticity 05/21/2014   Nonspecific findings on examination of urine    other   Osteopenia    PONV (postoperative nausea and vomiting)    Prediabetes    Sickle cell trait    Status post laparoscopic supracervical hysterectomy 11/26/2014   Tobacco user 11/26/2014   Wrist fracture    Past Surgical History:  Procedure Laterality Date   bilateral tubal ligation  1996   BREAST CYST EXCISION Left 2002   CYST EXCISION Left 05/10/2022   Procedure: CYST REMOVAL;  Surgeon: Rodolph Romano, MD;  Location: [facility] ORS;  Service: General;  Laterality: Left;   FRACTURE SURGERY     KNEE SURGERY      right   LAPAROSCOPIC SUPRACERVICAL HYSTERECTOMY  08/05/2013   ORIF WRIST FRACTURE Left 01/17/2017   Procedure: OPEN REDUCTION INTERNAL FIXATION (ORIF) WRIST FRACTURE;  Surgeon: Leora Lynwood SAUNDERS, MD;  Location: [facility] OR[facility];  Service: Orthopedics;  Laterality: Left;   RADIOLOGY WITH ANESTHESIA N/A 03/18/2020   Procedure: MRI WITH ANESTHESIA CERVICAL SPINE AND BRAIN  WITH AND WITHOUT CONTRAST;  Surgeon: Radiologist, Medication, MD;  Location: [facility] OR;  Service: Radiology;  Laterality: N/A;   right tibia fracture Right    2024   TUBAL LIGATION Bilateral    VAGINAL HYSTERECTOMY  03/2006   VAGINAL HYSTERECTOMY     2014   Patient Active Problem List   Diagnosis Date Noted   Health care maintenance 04/21/2024   SOB (shortness of breath) on exertion 03/12/2024   Hyperglycemia 01/09/2024   Abnormal LFTs (liver function tests) 09/17/2023   Sinus tachycardia 07/07/2023   Aortic atherosclerosis 01/18/2023   Prediabetes 01/18/2023   Hypokalemia 07/29/2022   Weakness of both lower extremities 07/29/2022   Abscess of left groin 11/15/2021   Abnormal LFTs 11/15/2021   Acute respiratory disease due to COVID-19 virus 11/21/2020   Fatigue due to excessive exertion    Hypoalbuminemia due to protein-calorie malnutrition    Neurogenic bowel  Neurogenic bladder    Labile blood pressure    Neuropathic pain    Multiple sclerosis, relapsing-remitting 07/09/2019   Abscess of female pelvis    SVT (supraventricular tachycardia)    Radial styloid tenosynovitis 03/12/2018   Wheelchair dependence 02/27/2018   Localized osteoporosis with current pathological fracture with routine healing 01/19/2017   Wrist fracture 01/16/2017   Sprain of ankle 03/23/2016   Closed fracture of lateral malleolus 03/16/2016   Depression screening 01/24/2016   Blood pressure elevated without history of HTN 10/25/2015   Essential hypertension 10/25/2015   Multiple sclerosis 10/02/2015   Chronic left shoulder pain 07/19/2015    Multiple sclerosis exacerbation 07/14/2015   MS (multiple sclerosis) 11/26/2014   Increased body mass index 11/26/2014   HPV test positive 11/26/2014   Status post laparoscopic supracervical hysterectomy 11/26/2014   Galactorrhea 11/26/2014   Back ache 05/21/2014   Adiposity 05/21/2014   Disordered sleep 05/21/2014   Muscle spasticity 05/21/2014   Spasticity 05/21/2014   Calculus of kidney 12/09/2013   Renal colic 12/09/2013   Hypercholesteremia 08/19/2013   Hereditary and idiopathic neuropathy 08/19/2013   Hypercholesterolemia without hypertriglyceridemia 08/19/2013   Bladder infection, chronic 07/25/2012   Disorder of bladder function 07/25/2012   Incomplete bladder emptying 07/25/2012   Microscopic hematuria 07/25/2012   Right upper quadrant pain 07/25/2012    ONSET DATE: 1995  REFERRING DIAG: MS  THERAPY DIAG:  Muscle weakness (generalized)  Multiple sclerosis exacerbation  Difficulty in walking, not elsewhere classified  Unsteadiness on feet  Rationale for Evaluation and Treatment: Rehabilitation  SUBJECTIVE:                                                                                                                                                                                             SUBJECTIVE STATEMENT: Patient reports she is tired but doing well.   Pt accompanied by: self  PERTINENT HISTORY:  Patient is returning to PT s/p hospitalization.  s/p ORIF of R tibia shaft fracture 09/23/2022. Patient has weakness in BLE with RLE>LLE. She drives with hand controls. Patient has been diagnosed with MS in 1995. PMH includes: back pain, CBP, chronic L shoulder pain, galactorrhea, neuropathy, HPV, hypercholestermeia, HTN, MS, osteopenia, PONV, wrist fracture. Additional order for other closed fracture of proximal end of R tibia with routine healing. Still has to use [employer].   PAIN:  Are you having pain? Occasional pain in RLE; primarily in knee  PRECAUTIONS:  Fall  RED FLAGS: None   WEIGHT BEARING RESTRICTIONS: No  FALLS: Has patient fallen in last 6 months? No  LIVING ENVIRONMENT: Lives with: lives with their family  Lives in: House/apartment Stairs: ramp Has following equipment at home: Vannie - 2 wheeled, Wheelchair (manual), shower chair, and Grab bars  PLOF: Independent with household mobility with device  PATIENT GOALS: to get her independence back. To be able to get into car, toilet, and get dressed independently  OBJECTIVE:  Note: Objective measures were completed at Evaluation unless otherwise noted.  DIAGNOSTIC FINDINGS: MRI of the brain 03/18/2020 showed multiple T2/FLAIR hyperintense foci in the periventricular, juxtacortical and deep white matter.  There were no infratentorial lesions noted.  None of the foci enhanced.   Due to severe claustrophobia, the study was done with conscious sedation in the hospital   COGNITION: Overall cognitive status: Within functional limits for tasks assessed   SENSATION: Lack of sensation in feet, loss in bilateral lateral aspect of knee  COORDINATION: Does not have the strength for functional LE heel slide test   MUSCLE TONE: BLE mild tone    POSTURE: rounded shoulders, forward head, anterior pelvic tilt, and weight shift left   LOWER EXTREMITY MMT:    MMT Right Eval Left Eval  Hip flexion 0.9 1.5  Hip extension    Hip abduction 1.8 2.6  Hip adduction 3.1 1.9  Hip internal rotation    Hip external rotation    Knee flexion 0.9 1.5  Knee extension 0.8 1.2  Ankle dorsiflexion    Ankle plantarflexion    Ankle inversion    Ankle eversion    (Blank rows = not tested)  BED MOBILITY:  Assess in future session due to limited time  TRANSFERS: Assistive device utilized: Bariatric RW with sit to stands, slide board to table  Sit to stand: unable to reach full stand Stand to sit: unable to reach full stand  Chair to chair: slide board with CGA   GAIT: Unable to ambulate at  this time.   FUNCTIONAL TESTS:  Sit to stand: tricep press with BUE; unable to bring arm to walker.    Function In Sitting Test (FIST)  (1/2 femur on surface; hips/knees flexed to 90deg)   - indicate bed or mat table / step stool if used  SCORING KEY: 4 = Independent (completes task independently & successfully) 3 = Verbal Cues/Increased Time (completes task independently & successfully and only needs more time/cues) 2 = Upper Extremity Support (must use UE for support or assistance to complete successfully) 1 = Needs Assistance (unable to complete w/o physical assist; DOCUMENT LEVEL: min, mod, max) 0 = Dependent (requires complete physical assist; unable to complete successfully even w/ physical assist)  Randomly Administer Once Throughout Exam  4 - Anterior Nudge (superior sternum)  4 - Posterior Nudge (between scapular spines)  4 - Lateral Nudge (to dominant side at acromion)     4 - Static sitting (30 seconds)  4 - Sitting, shake no (left and right)  4 - Sitting, eyes closed (30 seconds)   0 - Sitting, lift foot (dominant side, lift foot 1 inch twice)    2 - Pick up object from behind (object at midline, hands breadth posterior)  3 - Forward reach (use dominant arm, must complete full motion) 2 - Lateral reach (use dominant arm, clear opposite ischial tuberosity) 2 - Pick up object from floor (from between feet)   2 - Posterior scooting (move backwards 2 inches)  2 - Anterior scooting (move forward 2 inches)  2 - Lateral scooting (move to dominant side 2 inches)    TOTAL = 39/56  Notes/comments: slide board tranfer to/from table  MCD > 5 points MCID for IP REHAB > 6 points  Function In Sitting Test (FIST) 08/14/23 (1/2 femur on surface; hips/knees flexed to 90deg)   - indicate bed or mat table / step stool if used  SCORING KEY: 4 = Independent (completes task independently & successfully) 3 = Verbal Cues/Increased Time (completes task independently &  successfully and only needs more time/cues) 2 = Upper Extremity Support (must use UE for support or assistance to complete successfully) 1 = Needs Assistance (unable to complete w/o physical assist; DOCUMENT LEVEL: min, mod, max) 0 = Dependent (requires complete physical assist; unable to complete successfully even w/ physical assist)  Randomly Administer Once Throughout Exam  4 - Anterior Nudge (superior sternum)  4 - Posterior Nudge (between scapular spines)  4 - Lateral Nudge (to dominant side at acromion)     4 - Static sitting (30 seconds)  4 - Sitting, shake no (left and right)  4 - Sitting, eyes closed (30 seconds)   0 - Sitting, lift foot (dominant side, lift foot 1 inch twice)    4 - Pick up object from behind (object at midline, hands breadth posterior)  4 - Forward reach (use dominant arm, must complete full motion) 4 - Lateral reach (use dominant arm, clear opposite ischial tuberosity)  - Pick up object from floor (from between feet)   3 - Posterior scooting (move backwards 2 inches)  3 - Anterior scooting (move forward 2 inches)  3 - Lateral scooting (move to dominant side 2 inches)    TOTAL = 47/56  MCD > 5 points MCID for IP REHAB > 6 points   TREATMENT DATE: 04/24/2024   Gait:   Pt ambulated for a total of 3 attempts,  unable to complete full stand first and second attempt with buckling of LE's requiring PT max A to chair to avoid collapse.    Pt utilized the bariatric FWW, pillow under the thighs for easier ability to come upright, and sock applied to the L foot to reduce friction of the foot in mobility.    pt's symptoms monitored throughout the session.      TherEx: To improve strength, endurance, mobility, and function of specific targeted muscle groups or improve joint range of motion or improve muscle flexibility  Hip abduction 15x isometric contraction into PT legs  Hip adduction 15x isometric contraction into PT legs Seated leg press 15x each  LE Seated hamstring curl with slider 15x each LE   TherAct:  Seated partial sit to stand 5x with focus on LE activation  Activity Description: seated reach with dynamic response of L=green R=red, stability and core training  Activity Setting:  The [employer] Random setting was chosen to enhance cognitive processing and agility, providing an unpredictable environment to simulate real-world scenarios, and fostering quick reactions and adaptability.   Number of Pods:  6 Cycles/Sets:  3 Duration (Time or Hit Count):  60 seconds   Patient Stats   Hits:   63,62, 73    PATIENT EDUCATION:  Education details: Pt educated throughout session about proper posture and technique with exercises. Improved exercise technique, movement at target joints, use of target muscles after min to mod verbal, visual, tactile cues.  Person educated: Patient Education method: Explanation, Demonstration, Tactile cues, and Verbal cues Education comprehension: verbalized understanding, returned demonstration, verbal cues required, tactile cues required, and needs further education    HOME EXERCISE PROGRAM: Stand 3x/day in stander   GOALS: Goals reviewed with patient? Yes  SHORT TERM GOALS: Target date: 08/08/2023  Patient will be independent in home exercise program to improve strength/mobility for better functional independence with ADLs.  Baseline: 4/8: compliance Goal status: MET    LONG TERM GOALS: Target date: 03/20/2024  Patient will tolerate five consecutive stands with UE support from wheelchair to standing to improve functional mobility with additional cushion and RW.  Baseline: unable to perform 4/8:unable to perform full stand 5/20: perform next session 5/29: two full stands with CGA and cushion on her seat 7/31: able to stand 3 times from [facility]. With no addition cushion  in parallel bars. 12/27/2023= Will retest next session- concentrated on just standing on past 2 visits 03/20/24:  Goal status:  Ongoing   2.  Patient will ambulate 10 ft with bRW with wheelchair follow.  Baseline:  unable to ambulate 4/8: unable to ambulate 5/20: able to ambulate length of // bars in previous session 5/27: ambulate 2.5 lengths of // bars with two seated rest breaks with min A for weight shift and wheelchair follow  7/31: able to ambulate in parallel bars 3 ft with min assist and +2 follow in WC> difficulty advancing the LLE on this day. 12/27/2023- Patient unable to take a step forward today but was abel to take a step on previous visit- limited by clonus like trembling that has progressed over past month. 03/20/24: 26' at one time Goal status: MET   3.  Patient will improve FIST score >6 points to demonstrate improved stability and ability to perform ADLs.  Baseline:  3/6: 39/56 4/8: 47/ 56 5/20: 48/56 7/31:46/56 Goal status: MET  4.  Patient will perform toileting with mod I at home for improved independence.  Baseline: 3/6: requires assistance 4/8: requires dependence on machine 5/20: requires assistance with use of wipe buddy.  7/31: continues to require use of sara steady and wipe buddy for safety and hygiene. 12/27/2023- Patient reports that since she is unable to take steps that she continues to require use of sara steady and wipe buddy for safe personal hygiene. 03/20/24: Pt utilizing sara stedy to perform safe personal hygiene, however is no longer utilizing the wipe buddy. Goal status: PROGRESSING   5. Patient will perform > 5 min of static or dynamic standing with BUE and Min/mod assist  for improved LE strength and pre-gait function to assist with toileting and overall standing ability for improve functional independence.  Baseline: 12/27/2023-Patient was able to stand for just over 3 min today. 01/10/24: Pt able to stand 1 min 38 sec 02/14/24: Pt able to >3 minutes at this time. 03/20/24: Pt able to achieve >4 minutes at this time. Goal status: PROGRESSING  6.  Patient will ambulate 40 ft with  BRW with wheelchair follow, in one attempt, without rest break. Baseline: 3' during one attempt. Goal status: NEW   ASSESSMENT:  CLINICAL IMPRESSION:  Patient has excellent motivation but is very challenged with standing this session. She was able to perform 3 sit to stands but unable to remain standing with LLE trembling and giving way. Patient is eager to progress her mobility.  Pt will continue to benefit from skilled therapy to address remaining deficits in order to improve overall QoL and return to PLOF.        OBJECTIVE IMPAIRMENTS: Abnormal gait, cardiopulmonary status limiting activity, decreased activity tolerance, decreased balance, decreased coordination, decreased endurance, decreased mobility, difficulty walking, decreased ROM, decreased strength, hypomobility, increased fascial restrictions, impaired perceived functional ability, impaired flexibility, impaired sensation, improper body mechanics,  and postural dysfunction.   ACTIVITY LIMITATIONS: carrying, lifting, bending, sitting, standing, squatting, sleeping, stairs, transfers, bed mobility, continence, bathing, toileting, dressing, self feeding, reach over head, hygiene/grooming, locomotion level, and caring for others  PARTICIPATION LIMITATIONS: meal prep, cleaning, laundry, interpersonal relationship, driving, shopping, community activity, and church  PERSONAL FACTORS: Age, Past/current experiences, Time since onset of injury/illness/exacerbation, Transportation, and 3+ comorbidities: ack pain, CBP, chronic L shoulder pain, galactorrhea, neuropathy, HPV, hypercholestermeia, HTN, MS, osteopenia, PONV, wrist fracture are also affecting patient's functional outcome.   REHAB POTENTIAL: Good  CLINICAL DECISION MAKING: Evolving/moderate complexity  EVALUATION COMPLEXITY: Moderate  PLAN:  PT FREQUENCY: 2x/week  PT DURATION: 12 weeks  PLANNED INTERVENTIONS: 97164- PT Re-evaluation, 97110-Therapeutic exercises, 97530-  Therapeutic activity, 97112- Neuromuscular re-education, 97535- Self Care, [id number]- Manual therapy, [phone]- Gait training, [phone]- Orthotic Fit/training, [phone]- Canalith repositioning, [id number]- Aquatic Therapy, [phone]- Electrical stimulation (unattended), [phone]- Electrical stimulation (manual), [id number]- Ultrasound, [id number]- Traction (mechanical), Patient/Family education, Balance training, Stair training, Taping, Dry Needling, Joint mobilization, Joint manipulation, Spinal manipulation, Spinal mobilization, Scar mobilization, Compression bandaging, Vestibular training, Visual/preceptual remediation/compensation, Cognitive remediation, DME instructions, Cryotherapy, Moist heat, and Biofeedback  PLAN FOR NEXT SESSION:  Strap stand frame with more dynamic activities Sit to stands; continue supported walking, continue dynamic core stabilization training and seated reaching tasks to improve weight shifting.  Continue with ambulation in the hallway with the bariatric walker, sock, and use of cushion for easier lift off.   Labrea Eccleston  Leopoldo, PT, DPT Physical Therapist - [facility]  Outpatient Physical Therapy- Main Campus [phone]    04/24/2024, 11:00 AM

## 2024-04-23 NOTE — Progress Notes (Signed)
 Office: [phone]  /  Fax: [phone]  WEIGHT SUMMARY AND BIOMETRICS  Weight Lost Since Last Visit: 3 lb  Weight Gained Since Last Visit: 0   Vitals Temp: 98 F (36.7 C) BP: 128/69 Pulse Rate: (!) 58 SpO2: 100 %   Anthropometric Measurements Height: 5' 11 (1.803 m) Weight: 239 lb (108.4 kg) BMI (Calculated): 33.35 Weight at Last Visit: 242 lb Weight Lost Since Last Visit: 3 lb Weight Gained Since Last Visit: 0 Starting Weight: 236 lb Total Weight Loss (lbs): 0 lb (0 kg)   No data recorded Other Clinical Data Fasting: no Labs: no Today's Visit #: 3 Starting Date: 03/12/24 Comments: wheelchair,no strip    Total Weight Loss:0 No bio impedence data- she is in wheelchair.   HPI  Chief Complaint: OBESITY  Lisa Williams is here to discuss her progress with her obesity treatment plan. She is on the the Category 1 Plan and states she is following her eating plan approximately 90 % of the time. She states she is exercising 60 minutes 2 days per week she is doing PT and OT.   Interval History:  Since last office visit she has been using activia yogurt- one she likes, has not liked Greek yogurt.  She is eating more protein- salads with protein.  She has gotten premier protein waffles to try and are good, no butter. She has been getting 32 ounces of water daily.  She does plan to have cranberry sparkling cider for Christmas. Doing combination of low calorie and regular Christmas food. Her cousin is bringing key lime pie and cherry pie and will have a slice of each   Lisa Williams does have hypertension and her BP's are currently well controlled on telmisartan  40 mg every day. Denies headaches, chest pain shortness of breath at rest and dizziness  BP Readings from Last 3 Encounters:  04/23/24 128/69  04/21/24 124/70  04/16/24 129/79   Continues to follow with neurology regularly for MS. Currently she is stable with her symptoms.  She continues OT and PT. Uses tizanidine  and  Baclofen  for muscle spasticity.   Her levothyroxine  has been increased to 88 mcg daily after last tsh Lab Results  Component Value Date   TSH 5.100 (H) 04/08/2024     PHYSICAL EXAM:  Blood pressure 128/69, pulse (!) 58, temperature 98 F (36.7 C), height 5' 11 (1.803 m), weight 239 lb (108.4 kg), SpO2 100%. Body mass index is 33.33 kg/m.  General: Well Developed, well nourished, and in no acute distress.  HEENT: Normocephalic, atraumatic; EOMI, sclerae are anicteric. Skin: Warm and dry, good turgor Chest:  Normal excursion, shape, no gross ABN Respiratory: No conversational dyspnea; speaking in full sentences NeuroM-Sk:  She is in wheelchair due to MS, leg weakness  Psych: A and O X 3, insight adequate, mood- full    DIAGNOSTIC DATA REVIEWED:  BMET    Component Value Date/Time   NA 140 04/21/2024 1452   NA 142 03/12/2024 1056   NA 139 02/15/2013 1700   K 4.3 04/21/2024 1452   K 3.8 02/15/2013 1700   CL 105 04/21/2024 1452   CL 109 (H) 02/15/2013 1700   CO2 28 04/21/2024 1452   CO2 25 02/15/2013 1700   GLUCOSE 85 04/21/2024 1452   GLUCOSE 94 02/15/2013 1700   BUN 26 (H) 04/21/2024 1452   BUN 22 03/12/2024 1056   BUN 20 (H) 02/15/2013 1700   CREATININE 0.54 04/21/2024 1452   CREATININE 0.59 (L) 02/15/2013 1700   CALCIUM  9.7 04/21/2024  1452   CALCIUM  9.0 02/15/2013 1700   GFRNONAA >60 07/08/2023 0620   GFRNONAA >60 02/15/2013 1700   GFRAA 110 05/13/2020 1146   GFRAA >60 02/15/2013 1700   Lab Results  Component Value Date   HGBA1C 5.3 04/21/2024   HGBA1C 5.1 08/01/2011   Lab Results  Component Value Date   INSULIN  8.0 03/12/2024   Lab Results  Component Value Date   TSH 5.100 (H) 04/08/2024   CBC    Component Value Date/Time   WBC 6.2 01/09/2024 1124   RBC 4.70 01/09/2024 1124   HGB 12.5 01/09/2024 1124   HGB 11.9 12/17/2023 1549   HCT 38.0 01/09/2024 1124   HCT 35.3 12/17/2023 1549   PLT 216.0 01/09/2024 1124   PLT 185 12/17/2023 1549   MCV  80.8 01/09/2024 1124   MCV 85 12/17/2023 1549   MCV 79 (L) 02/15/2013 1700   MCH 28.7 12/17/2023 1549   MCH 26.6 07/08/2023 0620   MCHC 33.0 01/09/2024 1124   RDW 15.3 01/09/2024 1124   RDW 15.3 12/17/2023 1549   RDW 13.6 02/15/2013 1700   Iron Studies    Component Value Date/Time   IRON 71 08/07/2019 0450   TIBC 253 08/07/2019 0450   FERRITIN 36 08/07/2019 0450   IRONPCTSAT 28 08/07/2019 0450   Lipid Panel     Component Value Date/Time   CHOL 154 04/21/2024 1452   CHOL 187 11/29/2016 0000   TRIG 57.0 04/21/2024 1452   HDL 49.70 04/21/2024 1452   HDL 55 11/29/2016 0000   CHOLHDL 3 04/21/2024 1452   VLDL 11.4 04/21/2024 1452   LDLCALC 92 04/21/2024 1452   LDLCALC 119 (H) 11/29/2016 0000   Hepatic Function Panel     Component Value Date/Time   PROT 6.9 04/21/2024 1452   PROT 7.2 03/12/2024 1056   PROT 7.2 02/15/2013 1700   ALBUMIN 4.3 04/21/2024 1452   ALBUMIN 4.4 03/12/2024 1056   ALBUMIN 3.6 02/15/2013 1700   AST 13 04/21/2024 1452   AST 20 02/15/2013 1700   ALT 12 04/21/2024 1452   ALT 15 02/15/2013 1700   ALKPHOS 134 (H) 04/21/2024 1452   ALKPHOS 127 02/15/2013 1700   BILITOT 0.4 04/21/2024 1452   BILITOT 0.5 03/12/2024 1056   BILITOT 0.3 02/15/2013 1700   BILIDIR 0.1 04/21/2024 1452   BILIDIR 0.11 12/17/2023 1549      Component Value Date/Time   TSH 5.100 (H) 04/08/2024 1014   TSH 14.87 (H) 02/25/2024 1051   Nutritional Lab Results  Component Value Date   VD25OH 76.9 03/12/2024   VD25OH 33.3 08/01/2021     ASSESSMENT AND PLAN  Class 1 obesity with serious comorbidity and body mass index (BMI) of 33.0 to 33.9 in adult, unspecified obesity type  TREATMENT PLAN FOR OBESITY:  Recommended Dietary Goals  Lisa Williams is currently in the action stage of change. As such, her goal is to continue weight management plan. She has agreed to the Category 1 Plan.  Behavioral Intervention  We discussed the following Behavioral Modification Strategies  today: increasing water intake , avoiding temptations and identifying enticing environmental cues, celebration eating strategies, continue to work on maintaining a reduced calorie state, getting the recommended amount of protein, incorporating whole foods, making healthy choices, staying well hydrated and practicing mindfulness when eating., and increase protein intake, fibrous foods (25 grams per day for women, 30 grams for men) and water to improve satiety and decrease hunger signals. .  She wants to lose weight in December -  She does  recognize that she must follow a structured plan and will eat before social situation to meet her protein goals and minimze party foods - She will give away food gifts unless they are very low calories or high protein - She will avoid calorie-containing liquids such as holiday drinks, eggnog and alcohol -She will stick to her structured plan strictly most of the time - This strategy should help her lose 1-2 pounds in December   Recommended Physical Activity Goals  Lumi has been advised to work up to 150 minutes of moderate intensity aerobic activity a week and strengthening exercises 2-3 times per week for cardiovascular health, weight loss maintenance and preservation of muscle mass.   She has agreed to Continue current level of physical activity    Pharmacotherapy We discussed various medication options to help Kumari with her weight loss efforts and we both agreed to continue nutrition and behavior modification.  ASSOCIATED CONDITIONS ADDRESSED TODAY  Action/Plan  Hypercholesteremia Continue category 1 meal plan, limit saturated fats. Increase lean protein, water and fiber. Continue Rosuvastatin 10 mg every day- denies side effects Continue activity as tolerated Continue to follow regularly with PCP  Essential hypertension Continue telmisartan  40 mg every day Continue Category 1 meal plan  and DASH diet Monitor BP and if consistently  >140/90 notify PCP If develops headaches, chest pain, shortness of breath or dizziness go to ER Continue to follow regularly with PCP  Prediabetes Continue Category 1  meal plan, limit simple carbohydrates. Increase lean protein, fiber and water Continue activity as tolerated and follow regularly with PCP  Multiple sclerosis       Continue PT and OT       Continue to follow with neurology regularly  Hypothyroidism, unspecified type       Continue Levothyroxine  88 mcg every day and follow regularly with PCP for lab monitoring.           Return in about 4 weeks (around 05/21/2024).[name] She was informed of the importance of frequent follow up visits to maximize her success with intensive lifestyle modifications for her multiple health conditions.   ATTESTASTION STATEMENTS:  Reviewed by clinician on day of visit: allergies, medications, problem list, medical history, surgical history, family history, social history, and previous encounter notes.   I personally spent a total of 30 minutes in the care of the patient today including preparing to see the patient, getting/reviewing separately obtained history, performing a medically appropriate exam/evaluation, counseling and educating, and documenting clinical information in the EHR.   Landen Knoedler ANP-C

## 2024-04-23 NOTE — Progress Notes (Signed)
Order placed for add on lab (GGT).

## 2024-04-23 NOTE — Therapy (Signed)
 OUTPATIENT OCCUPATIONAL THERAPY NEURO TREATMENT NOTE  Patient Name: Lisa Williams MRN: [id number] DOB:1961-01-31, 63 y.o., female Today's Date: 04/23/2024  PCP: Dr. Layman Piety   Neurologist Dr. Suanne at [facility] REFERRING PROVIDER: Dr. Layman Piety    END OF SESSION:  OT End of Session - 04/23/24 1340     Visit Number 63    Number of Visits 67    Date for Recertification  04/29/24    Authorization Time Period Reporting period beginning 04/10/24    Progress Note Due on Visit 70    OT Start Time 1100    OT Stop Time 1140    OT Time Calculation (min) 40 min    Equipment Utilized During Treatment manual wc    Activity Tolerance Patient tolerated treatment well    Behavior During Therapy [facility] for tasks assessed/performed         Past Medical History:  Diagnosis Date   Abdominal pain, right upper quadrant    Back pain    Calculus of kidney 12/09/2013   Chronic back pain    unspecified   Chronic left shoulder pain 07/19/2015   Complication of anesthesia    Functional disorder of bladder    other   Galactorrhea 11/26/2014   Chronic    Hereditary and idiopathic neuropathy 08/19/2013   High cholesterol    History of kidney stones    HPV test positive    Hypercholesteremia 08/19/2013   Hypertension    Hypertension    Hypothyroidism    Incomplete bladder emptying    Microscopic hematuria    MS (multiple sclerosis)    Muscle spasticity 05/21/2014   Nonspecific findings on examination of urine    other   Osteopenia    PONV (postoperative nausea and vomiting)    Prediabetes    Sickle cell trait    Status post laparoscopic supracervical hysterectomy 11/26/2014   Tobacco user 11/26/2014   Wrist fracture    Past Surgical History:  Procedure Laterality Date   bilateral tubal ligation  1996   BREAST CYST EXCISION Left 2002   CYST EXCISION Left 05/10/2022   Procedure: CYST REMOVAL;  Surgeon: Rodolph Romano, MD;  Location: [facility] ORS;  Service:  General;  Laterality: Left;   FRACTURE SURGERY     KNEE SURGERY     right   LAPAROSCOPIC SUPRACERVICAL HYSTERECTOMY  08/05/2013   ORIF WRIST FRACTURE Left 01/17/2017   Procedure: OPEN REDUCTION INTERNAL FIXATION (ORIF) WRIST FRACTURE;  Surgeon: Leora Lynwood SAUNDERS, MD;  Location: [facility] ORS;  Service: Orthopedics;  Laterality: Left;   RADIOLOGY WITH ANESTHESIA N/A 03/18/2020   Procedure: MRI WITH ANESTHESIA CERVICAL SPINE AND BRAIN  WITH AND WITHOUT CONTRAST;  Surgeon: Radiologist, Medication, MD;  Location: [facility] OR;  Service: Radiology;  Laterality: N/A;   right tibia fracture Right    2024   TUBAL LIGATION Bilateral    VAGINAL HYSTERECTOMY  03/2006   VAGINAL HYSTERECTOMY     2014   Patient Active Problem List   Diagnosis Date Noted   Health care maintenance 04/21/2024   SOB (shortness of breath) on exertion 03/12/2024   Hyperglycemia 01/09/2024   Abnormal LFTs (liver function tests) 09/17/2023   Sinus tachycardia 07/07/2023   Aortic atherosclerosis 01/18/2023   Prediabetes 01/18/2023   Hypokalemia 07/29/2022   Weakness of both lower extremities 07/29/2022   Abscess of left groin 11/15/2021   Abnormal LFTs 11/15/2021   Acute respiratory disease due to COVID-19 virus 11/21/2020   Fatigue due to excessive exertion  Hypoalbuminemia due to protein-calorie malnutrition    Neurogenic bowel    Neurogenic bladder    Labile blood pressure    Neuropathic pain    Multiple sclerosis, relapsing-remitting 07/09/2019   Abscess of female pelvis    SVT (supraventricular tachycardia)    Radial styloid tenosynovitis 03/12/2018   Wheelchair dependence 02/27/2018   Localized osteoporosis with current pathological fracture with routine healing 01/19/2017   Wrist fracture 01/16/2017   Sprain of ankle 03/23/2016   Closed fracture of lateral malleolus 03/16/2016   Depression screening 01/24/2016   Blood pressure elevated without history of HTN 10/25/2015   Essential hypertension 10/25/2015    Multiple sclerosis 10/02/2015   Chronic left shoulder pain 07/19/2015   Multiple sclerosis exacerbation 07/14/2015   MS (multiple sclerosis) 11/26/2014   Increased body mass index 11/26/2014   HPV test positive 11/26/2014   Status post laparoscopic supracervical hysterectomy 11/26/2014   Galactorrhea 11/26/2014   Back ache 05/21/2014   Adiposity 05/21/2014   Disordered sleep 05/21/2014   Muscle spasticity 05/21/2014   Spasticity 05/21/2014   Calculus of kidney 12/09/2013   Renal colic 12/09/2013   Hypercholesteremia 08/19/2013   Hereditary and idiopathic neuropathy 08/19/2013   Hypercholesterolemia without hypertriglyceridemia 08/19/2013   Bladder infection, chronic 07/25/2012   Disorder of bladder function 07/25/2012   Incomplete bladder emptying 07/25/2012   Microscopic hematuria 07/25/2012   Right upper quadrant pain 07/25/2012   ONSET DATE: 5/24 Amon and broke R leg which caused beginning of ADL decline; diagnosed with MS in 1995)  REFERRING DIAG: MS  THERAPY DIAG:  Muscle weakness (generalized)  Other lack of coordination  Multiple sclerosis exacerbation  Rationale for Evaluation and Treatment: Rehabilitation  SUBJECTIVE:  SUBJECTIVE STATEMENT: Pt reports she had a near fall in PT this morning.  Pt states her legs got weak, but that she's ok.   Pt accompanied by: self  PERTINENT HISTORY: Pt reports increased difficulty with basic self care tasks since breaking her R leg last May.  Since that time, mobility and ADLs have declined, with pt requiring assist from private caregivers and family to manage bathing, toileting, dressing, and functional transfers.  PRECAUTIONS: Fall  WEIGHT BEARING RESTRICTIONS: No  PAIN: 04/22/24: No pain today  Are you having pain? No; occasional pain in R lower back, but not today  FALLS: Has patient fallen in last 6 months? No Last fall was May 17 of 2024  LIVING ENVIRONMENT: Lives with: lives with their family (including mother  who has dementia and sister [name])  Lives in: 2 level home but pt resides on main level Stairs: ramp  Has following equipment at home: Wheelchair (manual), Shower bench, and hand held shower shower, 1 grab bar in the shower, 3in1 commode, sit to stand lift (electric), FWW, hospital bed  PLOF: modified indep with ADL/IADLs prior to May of 7975   PATIENT GOALS: Increase independence with basic self care tasks  OBJECTIVE:  Note: Objective measures were completed at Evaluation unless otherwise noted.  HAND DOMINANCE: Right  ADLs:  Overall ADLs: assist provided from paid caregiver and sister Transfers/ambulation related to ADLs: sit to stand lift for all transfers, set up for sliding board in/out of bed  Eating: indep  Grooming: modified indep (wc level in mom's bathroom)  UB Dressing: set up (can't access her bedroom closet)  LB Dressing: able to sit on side of bed to don all LB clothing; assist to hike pants/underwear Toileting: assist with clothing management d/t standing within electric lift Bathing: bed bath with  sister helping to wash backside Tub Shower transfers: N/A; pt has walk in shower in mom's bathroom (wc does fit in this bathroom but pt reports inability to transfer from wc and requires arm rests on a bench for a successful transfer attempt from any DME).  Tub bench is in pt's bathroom, but lift does not fit through bathroom doorway and pt reports inability to transfer up from tub bench (currently unable to manage either transfer)   Equipment: see above  IADLs: Shopping: [employer] for shopping Light housekeeping: modified indep from wc level Meal Prep: modified indep from wc level Community mobility: relies on community transportation or family members Medication management: indep Landscape architect: indep Handwriting: NT; pt denies any [facility] challenges  MOBILITY STATUS: Hx of falls  POSTURE COMMENTS:  Rounded shoulders, forward head, anterior pelvic tilt, and  weight shift left   ACTIVITY TOLERANCE: Eval: Activity tolerance: Per PT; currently standing 30-60 sec within Light Gait harness/lift; currently non-ambulatory  01/24/24: Standing at sink: 1 minute; min A and 5 attempts to achieve fully erect standing position with BUEs supported on sink countertop, OT assist to block R knee to prevent buckling   02/28/24: x4 attempts, achieves 75% upright standing with MIN A to block knees.   UPPER EXTREMITY ROM:  BUEs WFL  UPPER EXTREMITY MMT:     MMT Right eval Left eval  Shoulder flexion 4+ 4  Shoulder abduction 4+ 4  Shoulder adduction    Shoulder extension    Shoulder internal rotation 4+ 4+  Shoulder external rotation 4 4  Middle trapezius    Lower trapezius    Elbow flexion 4+ 4+  Elbow extension 4+ 4+  Wrist flexion 4+ 4+  Wrist extension 4+ 4+  Wrist ulnar deviation    Wrist radial deviation    Wrist pronation    Wrist supination    (Blank rows = not tested)  HAND FUNCTION/COORDINATION:  R/L WNL; pt denies any coordination deficits/ able to manipulate pills/clothing fasteners/etc without difficulty  11/15/23: *Measures taken d/t PT note revealing pt with increased difficulty manipulating smaller ADL supplies during reaching activities in PT session   11/15/23: Grip strength: R: 70 lbs, L: 63 lbs Pinch strength: Lateral: R: 16 lbs, L: 15 lbs; 3 point pinch: R: 12 lbs, L: 13 lbs 9 hole peg test: Right: 25 sec, L 49 sec   12/25/23: Grip strength: Right: 70 lbs; Left: 65 lbs  9 hole peg test: Right: 24 sec; Left: 3 trials: 54 sec, 1 min, 56 sec  01/24/24: Grip grip: Right: 76 lbs; Left: 67 lbs Lateral pinch: R: 17 lbs, L: 17 lbs; 3 point pinch: R: 13 lbs, L: 14 lbs  9 Hole Peg Test: R: 26 sec, L: 50 sec  02/28/24: Grip: Right: 85 lbs; Left: 71 lbs Lateral pinch: R: 20 lbs, L: 19 lbs; 3 point pinch: R: 17 lbs, L: 18 lbs  9 Hole Peg Test: R: 29 sec, L: 40 sec   04/10/24: Grip: Right: 85 lbs; Left: 81 lbs;   Lateral pinch: R: 18  lbs, L: 18 lbs; 3 point pinch: R: 17 lbs, L: 18 lbs   9 hole Peg Test: R: 25 sec, L 32 sec   SENSATION: WFL  EDEMA: No visible edema in BUEs  MUSCLE TONE: BUEs WNL  COGNITION: Overall cognitive status: Within functional limits for tasks assessed  VISION: wears glasses all the time, no reports of diplopia  PERCEPTION: Not tested  PRAXIS: WFL  OBSERVATIONS:  Pt pleasant, cooperative,  and motivated to work towards improving indep with ADLs.                                                                                                     TREATMENT DATE: 04/22/24 Therapeutic Activity: -Practiced weight shifting with increased anterior pelvic tilt, working to progress from a scoot pivot to a squat pivot transfer.  Pt completed 1 trial wc>mat table, and 1 trial mat table>wc, utilizing sliding board for guarding, with OT providing, tactile cues to engage quads, min A for LE positioning throughout transfer and mod vc for anterior pelvic tilt to allow increased lift of buttocks from chair. -Participated in dynamic sitting/core stability/pinch and [facility] strength for improved UB strength and coordination for ADLs.  Pt placed and removed therapy resistant clothespins from vertical and horizontal dowels.  Dowel position and clothespins positioned to challenge ipsilateral, contralateral, overhead, and below the knee level reaching outside BOS in sitting without UE support while seated on mat table (feet supported on floor).  Very occasional balance check requiring 1 hand support on mat table to return self to midline sitting after an extended reach laterally to the R or L with constant close supv provided.   Therapeutic Exercise: -Triceps strengthening: wc pushups x 3 sets 5 reps each, with vc for achieving good lift from seat with each rep.   PATIENT EDUCATION: Education details: squat pivot technique Person educated: Patient Education method: Explanation, Actor cues, and Verbal cues Education  comprehension: verbalized understanding and returned demonstration  HOME EXERCISE PROGRAM: IR A/AAROM to promote shoulder flexibility for peri care/bathing  GOALS: Goals reviewed with patient? Yes  SHORT TERM GOALS: Target date: 12/25/23  Pt will perform bed bath with set up only. Baseline: Eval: Min-mod A for posterior washing; 09/28/23: Min A for posterior washing; 11/13/23: Able to manage bed bath but sister helps with thoroughness, per pt's preference; pt in agreement to trial bath sitting EOB or in wc before next OT session. Goal status: achieved/d/c  2.  Pt will utilize sliding board for wc<>drop arm commode transfer with SBA. Baseline: Eval: Currently using electric lift for transfer to [facility]; 09/28/23: Not yet completed at home but transfers to commode have been easier without sliding board d/t pt implementing a scoot pivot. Goal status: d/c (pt does not prefer sliding board for commode transfer)  3.  Pt will be indep to perform HEP for maintaining BUE strength for ADLs and functional transfers. Baseline: Eval: HEP not yet initiated; 09/28/23: Pt is working on core stability exercises from wc, cane stretches for bilat shoulder IR, and tricep extension via wc pushups; 11/13/23: indep Goal status: achieved  LONG TERM GOALS: Target date: 04/29/24  Pt will perform sink bath with set up A. (Revised on 11/13/23 from min A to set up) Baseline: Eval: Currently bed bathing d/t inability to stand at sink without electric lift (lift does not fit into pt's bathroom); 09/28/23: still completing bed bath as pt is not yet able to stand; 11/13/23: Not yet attempted; pt encouraged to attempt before next OT session now that bed bath goal has  been met; 12/20/23: Pt is now completing sponge bath with min A while seated EOB (caregiver assists with washing peri area d/t no bed rail to aid in R lateral lean); 01/24/24: Performing with set up on EOB, completing lateral weight shifts to wash posterior/peri area with wc in  front of pt and bed rail for LUE support and foot of bed for RUE support  Goal status: achieved  2.  Pt will perform wc<>tub bench transfer with min A. Baseline: Eval: Unable; 09/28/23: Performed in OT clinic with min A, but not yet in the home Goal status: d/c (pt prefers sink bath)  3.  Pt will perform squat pivot transfer wc<>BSC with SBA.  Baseline: Eval: Eval: Currently using electric lift for transfer to [facility]; 09/28/23: Not yet completed in clinic with [facility], and using lift for [facility] in the home still; 11/13/23: Still using Camie lift at home and in clinic transfers are all scoot rather than squat pivots d/t LE weakness; 12/20/23: Pt reports that she plans to use her Camie lift until she can ambulate to her [facility] d/t limited space for wc in her desired location for [facility] at home. Goal status: d/c  4. Pt will perform scoot transfer wc<>toilet using grab bars with supv to allow toileting in community setting.  Baseline: Eval: Pt limits community outings d/t requiring use of lift for [facility] transfers; 09/28/23: Pt has performed 2 successful scoot pivot  Transfers to toilet in OT clinic with min A, extra time.  Further trials with clothing management on toilet needed, but pt can lower and hike  pants while sititng edge of mat via lateral leaning and min A to maintain core stability; 11/13/23: Pt performs with CGA and extra time; 12/19/23: Pt  performs with close supv on most attempts, occasional CGA; efficiency is improving; 01/24/24: ongoing improvements; able to complete in fewer scoots (3 scoots each way from wc<>toilet) d/t improve engagement of Les and improved anterior weight shift; occasional min guard-supv; pt completes with therapist in clinic, but not yet in other community settings with sister, and not yet voided on toilet in community. 02/28/24: has not trialed in community, completes with SBA; 04/10/24: deferred in recent visits to focus on standing for sink bath, per pt request    Goal status:  ongoing  5.  Pt will complete seated clothing management with min A to enable voiding in handicapped stall within community. Baseline: Recert 11/13/23: Pt can transfer to toilet with increased time and effort, but has not yet attempted clothing management or voiding while seated on commode in community setting.  Pt can lower pants to knees while seated in wc, but requires mod A to hike over hips from seated position; 12/20/23: Pt fully lowers panties and shorts while seated on commode with supv, can hike clothing to her her upper thighs, transfers back to wc, and then performs a tricep extension from wc level to allow caregiver to hike clothing remainder of the way for purposes of EC (min A for clothing management component) Goal status: achieved  6.  Pt will tolerate standing x1 min with close supv and BUE support to manage clothing in prep for toileting. Baseline: Eval: Currently standing in electric lift only, or within parallel bars without lift; 09/28/23: Not yet attempted; 11/13/23: Pt is able to perform static standing with heavy BUE support on parallel bars with PT; 12/19/22: Not attempted recently d/t new intermittent clonus in BLEs; 01/24/24: Tolerated standing 1 min today at sink with min A for  ascent, OT assist to block R knee from buckling. 02/28/24: tolerates up to 1 min standing, difficulty achieving stand 2/2 fatigue from PT; 04/10/24: Pt is inconsistent to achieve full standing position, but when able, can tolerate 1-2 min with CGA-min A and RW for support.  Pt was able to remove R hand briefly from walker (~3 sec or less) to touch R buttocks to simulate reach for clothing management and peri care.  Goal status: in progress  7.  Pt will increase bilat grip strength in order to securely grasp walker sufficiently for functional transfers and ambulation.  Baseline: Recert 11/13/23: TBD (PT note read following OT session, indicating pt with difficulty securing items in hand);  11/15/23: Grip strength:  R: 70 lbs, L: 63 lbs; 12/25/23: R 76 lbs, L 67 lbs; sufficient for functional transfers/amb  Goal status: achieved  8.  Pt will increase L [facility] skills in order to manipulate small ADL supplies with reduced dropping as indicated by 20 sec or more improvement in 9 hole peg test. Baseline: Recert 11/13/23: 9 hole peg test TBD (PT note read following OT session, indicating pt with difficulty manipulating and securing items in hands); 11/15/23: Right: 25 sec, L 49 sec; 12/25/23: Right: 24 sec; Left: 3 trials: 54 sec, 1 min, 56 sec; 01/24/24: R: 26 sec, L: 50 sec. 02/28/24: L: 40 sec; 04/10/24: R 25 sec, L 32 sec   Goal status: in progress  ASSESSMENT: CLINICAL IMPRESSION: Pt tolerated edge of mat sitting with feet supported well with intermittent rest breaks to perform above noted reaching activity.  Very occasional balance check requiring 1 hand support on mat table to return self to midline sitting after an extended reach laterally to the R or L with constant close supv provided.  Pt demonstrated fair lift of buttocks during wc<>mat table transfer, but still requiring mod vc for increasing anterior pelvic tilt, and pt requires multiple scoots (at least ~5 scoots per transfer) to complete full transfer, still completing a scoot pivot on sliding board rather than a squat pivot.  Pt continues to rely primarily on BUEs for lifting d/t LE weakness, but is beginning to engage quads some with vc/tc.  Pt will continue to benefit from skilled OT to work towards above noted goals in OT poc, working to maximize indep with daily tasks while reducing burden of care on caregivers.   PERFORMANCE DEFICITS: in functional skills including ADLs, IADLs, strength, pain, flexibility, Gross motor control, mobility, balance, body mechanics, endurance, and decreased knowledge of use of DME, and psychosocial skills including coping strategies, environmental adaptation, habits, and routines and behaviors.   IMPAIRMENTS: are limiting  patient from ADLs, IADLs, and social participation.   CO-MORBIDITIES: has co-morbidities such as neuropathy, back pain, obesity, MS, HTN that affects occupational performance. Patient will benefit from skilled OT to address above impairments and improve overall function.  MODIFICATION OR ASSISTANCE TO COMPLETE EVALUATION: No modification of tasks or assist necessary to complete an evaluation.  OT OCCUPATIONAL PROFILE AND HISTORY: Detailed assessment: Review of records and additional review of physical, cognitive, psychosocial history related to current functional performance.  CLINICAL DECISION MAKING: Moderate - several treatment options, min-mod task modification necessary  REHAB POTENTIAL: Good  EVALUATION COMPLEXITY: Moderate  PLAN:  OT FREQUENCY: 2x/week  OT DURATION: 12 weeks  PLANNED INTERVENTIONS: 97168 OT Re-evaluation, 97535 self care/ADL training, 02889 therapeutic exercise, 97530 therapeutic activity, 97112 neuromuscular re-education, 97140 manual therapy, 97116 gait training, 02989 moist heat, 97010 cryotherapy, balance training, functional mobility training, psychosocial  skills training, energy conservation, coping strategies training, patient/family education, and DME and/or AE instructions  RECOMMENDED OTHER SERVICES: None at this time (Pt currently receiving PT services in this clinic)  CONSULTED AND AGREED WITH PLAN OF CARE: Patient  PLAN FOR NEXT SESSION: see above  Inocente Blazing, MS, OTR/L  Inocente MARLA Blazing, OT 04/23/2024, 1:41 PM

## 2024-04-24 ENCOUNTER — Ambulatory Visit: Payer: Self-pay | Admitting: Internal Medicine

## 2024-04-24 ENCOUNTER — Ambulatory Visit

## 2024-04-24 ENCOUNTER — Ambulatory Visit: Payer: PPO

## 2024-04-24 DIAGNOSIS — G35D Multiple sclerosis, unspecified: Secondary | ICD-10-CM | POA: Diagnosis not present

## 2024-04-24 DIAGNOSIS — R262 Difficulty in walking, not elsewhere classified: Secondary | ICD-10-CM

## 2024-04-24 DIAGNOSIS — M6281 Muscle weakness (generalized): Secondary | ICD-10-CM

## 2024-04-24 DIAGNOSIS — R278 Other lack of coordination: Secondary | ICD-10-CM

## 2024-04-24 DIAGNOSIS — R2681 Unsteadiness on feet: Secondary | ICD-10-CM

## 2024-04-25 NOTE — Therapy (Signed)
 " OUTPATIENT OCCUPATIONAL THERAPY NEURO TREATMENT NOTE  Patient Name: Lisa Williams MRN: [id number] DOB:April 15, 1961, 63 y.o., female Today's Date: 04/25/2024  PCP: Dr. Layman Piety   Neurologist Dr. Suanne at [facility] REFERRING PROVIDER: Dr. Layman Piety    END OF SESSION:  OT End of Session - 04/25/24 1129     Visit Number 64    Number of Visits 67    Date for Recertification  04/29/24    Authorization Time Period Reporting period beginning 04/10/24    Progress Note Due on Visit 70    OT Start Time 1100    OT Stop Time 1140    OT Time Calculation (min) 40 min    Equipment Utilized During Treatment manual wc    Activity Tolerance Patient tolerated treatment well    Behavior During Therapy [facility] for tasks assessed/performed         Past Medical History:  Diagnosis Date   Abdominal pain, right upper quadrant    Back pain    Calculus of kidney 12/09/2013   Chronic back pain    unspecified   Chronic left shoulder pain 07/19/2015   Complication of anesthesia    Functional disorder of bladder    other   Galactorrhea 11/26/2014   Chronic    Hereditary and idiopathic neuropathy 08/19/2013   High cholesterol    History of kidney stones    HPV test positive    Hypercholesteremia 08/19/2013   Hypertension    Hypertension    Hypothyroidism    Incomplete bladder emptying    Microscopic hematuria    MS (multiple sclerosis)    Muscle spasticity 05/21/2014   Nonspecific findings on examination of urine    other   Osteopenia    PONV (postoperative nausea and vomiting)    Prediabetes    Sickle cell trait    Status post laparoscopic supracervical hysterectomy 11/26/2014   Tobacco user 11/26/2014   Wrist fracture    Past Surgical History:  Procedure Laterality Date   bilateral tubal ligation  1996   BREAST CYST EXCISION Left 2002   CYST EXCISION Left 05/10/2022   Procedure: CYST REMOVAL;  Surgeon: Rodolph Romano, MD;  Location: [facility] ORS;  Service:  General;  Laterality: Left;   FRACTURE SURGERY     KNEE SURGERY     right   LAPAROSCOPIC SUPRACERVICAL HYSTERECTOMY  08/05/2013   ORIF WRIST FRACTURE Left 01/17/2017   Procedure: OPEN REDUCTION INTERNAL FIXATION (ORIF) WRIST FRACTURE;  Surgeon: Leora Lynwood SAUNDERS, MD;  Location: [facility] ORS;  Service: Orthopedics;  Laterality: Left;   RADIOLOGY WITH ANESTHESIA N/A 03/18/2020   Procedure: MRI WITH ANESTHESIA CERVICAL SPINE AND BRAIN  WITH AND WITHOUT CONTRAST;  Surgeon: Radiologist, Medication, MD;  Location: [facility] OR;  Service: Radiology;  Laterality: N/A;   right tibia fracture Right    2024   TUBAL LIGATION Bilateral    VAGINAL HYSTERECTOMY  03/2006   VAGINAL HYSTERECTOMY     2014   Patient Active Problem List   Diagnosis Date Noted   Health care maintenance 04/21/2024   SOB (shortness of breath) on exertion 03/12/2024   Hyperglycemia 01/09/2024   Abnormal LFTs (liver function tests) 09/17/2023   Sinus tachycardia 07/07/2023   Aortic atherosclerosis 01/18/2023   Prediabetes 01/18/2023   Hypokalemia 07/29/2022   Weakness of both lower extremities 07/29/2022   Abscess of left groin 11/15/2021   Abnormal LFTs 11/15/2021   Acute respiratory disease due to COVID-19 virus 11/21/2020   Fatigue due to excessive exertion  Hypoalbuminemia due to protein-calorie malnutrition    Neurogenic bowel    Neurogenic bladder    Labile blood pressure    Neuropathic pain    Multiple sclerosis, relapsing-remitting 07/09/2019   Abscess of female pelvis    SVT (supraventricular tachycardia)    Radial styloid tenosynovitis 03/12/2018   Wheelchair dependence 02/27/2018   Localized osteoporosis with current pathological fracture with routine healing 01/19/2017   Wrist fracture 01/16/2017   Sprain of ankle 03/23/2016   Closed fracture of lateral malleolus 03/16/2016   Depression screening 01/24/2016   Blood pressure elevated without history of HTN 10/25/2015   Essential hypertension 10/25/2015    Multiple sclerosis 10/02/2015   Chronic left shoulder pain 07/19/2015   Multiple sclerosis exacerbation 07/14/2015   MS (multiple sclerosis) 11/26/2014   Increased body mass index 11/26/2014   HPV test positive 11/26/2014   Status post laparoscopic supracervical hysterectomy 11/26/2014   Galactorrhea 11/26/2014   Back ache 05/21/2014   Adiposity 05/21/2014   Disordered sleep 05/21/2014   Muscle spasticity 05/21/2014   Spasticity 05/21/2014   Calculus of kidney 12/09/2013   Renal colic 12/09/2013   Hypercholesteremia 08/19/2013   Hereditary and idiopathic neuropathy 08/19/2013   Hypercholesterolemia without hypertriglyceridemia 08/19/2013   Bladder infection, chronic 07/25/2012   Disorder of bladder function 07/25/2012   Incomplete bladder emptying 07/25/2012   Microscopic hematuria 07/25/2012   Right upper quadrant pain 07/25/2012   ONSET DATE: 5/24 Amon and broke R leg which caused beginning of ADL decline; diagnosed with MS in 1995)  REFERRING DIAG: MS  THERAPY DIAG:  Muscle weakness (generalized)  Other lack of coordination  Multiple sclerosis exacerbation  Rationale for Evaluation and Treatment: Rehabilitation  SUBJECTIVE:  SUBJECTIVE STATEMENT: Pt reports some hesitancy with coming in for OT on an alternate day to PT, which was previously discussed between OT and pt, but pt wanting to stay with current scheduling for now.  OT in agreement with plan.  Pt accompanied by: self  PERTINENT HISTORY: Pt reports increased difficulty with basic self care tasks since breaking her R leg last May.  Since that time, mobility and ADLs have declined, with pt requiring assist from private caregivers and family to manage bathing, toileting, dressing, and functional transfers.  PRECAUTIONS: Fall  WEIGHT BEARING RESTRICTIONS: No  PAIN: 04/24/24: No pain today  Are you having pain? No; occasional pain in R lower back, but not today  FALLS: Has patient fallen in last 6 months? No  Last fall was May 17 of 2024  LIVING ENVIRONMENT: Lives with: lives with their family (including mother who has dementia and sister [name])  Lives in: 2 level home but pt resides on main level Stairs: ramp  Has following equipment at home: Wheelchair (manual), Shower bench, and hand held shower shower, 1 grab bar in the shower, 3in1 commode, sit to stand lift (electric), FWW, hospital bed  PLOF: modified indep with ADL/IADLs prior to May of 7975   PATIENT GOALS: Increase independence with basic self care tasks  OBJECTIVE:  Note: Objective measures were completed at Evaluation unless otherwise noted.  HAND DOMINANCE: Right  ADLs:  Overall ADLs: assist provided from paid caregiver and sister Transfers/ambulation related to ADLs: sit to stand lift for all transfers, set up for sliding board in/out of bed  Eating: indep  Grooming: modified indep (wc level in mom's bathroom)  UB Dressing: set up (can't access her bedroom closet)  LB Dressing: able to sit on side of bed to don all LB clothing; assist to  hike pants/underwear Toileting: assist with clothing management d/t standing within electric lift Bathing: bed bath with sister helping to wash backside Tub Shower transfers: N/A; pt has walk in shower in mom's bathroom (wc does fit in this bathroom but pt reports inability to transfer from wc and requires arm rests on a bench for a successful transfer attempt from any DME).  Tub bench is in pt's bathroom, but lift does not fit through bathroom doorway and pt reports inability to transfer up from tub bench (currently unable to manage either transfer)   Equipment: see above  IADLs: Shopping: [employer] for shopping Light housekeeping: modified indep from wc level Meal Prep: modified indep from wc level Community mobility: relies on community transportation or family members Medication management: indep Landscape architect: indep Handwriting: NT; pt denies any FMC challenges  MOBILITY  STATUS: Hx of falls  POSTURE COMMENTS:  Rounded shoulders, forward head, anterior pelvic tilt, and weight shift left   ACTIVITY TOLERANCE: Eval: Activity tolerance: Per PT; currently standing 30-60 sec within Light Gait harness/lift; currently non-ambulatory  01/24/24: Standing at sink: 1 minute; min A and 5 attempts to achieve fully erect standing position with BUEs supported on sink countertop, OT assist to block R knee to prevent buckling   02/28/24: x4 attempts, achieves 75% upright standing with MIN A to block knees.   UPPER EXTREMITY ROM:  BUEs WFL  UPPER EXTREMITY MMT:     MMT Right eval Left eval  Shoulder flexion 4+ 4  Shoulder abduction 4+ 4  Shoulder adduction    Shoulder extension    Shoulder internal rotation 4+ 4+  Shoulder external rotation 4 4  Middle trapezius    Lower trapezius    Elbow flexion 4+ 4+  Elbow extension 4+ 4+  Wrist flexion 4+ 4+  Wrist extension 4+ 4+  Wrist ulnar deviation    Wrist radial deviation    Wrist pronation    Wrist supination    (Blank rows = not tested)  HAND FUNCTION/COORDINATION:  R/L WNL; pt denies any coordination deficits/ able to manipulate pills/clothing fasteners/etc without difficulty  11/15/23: *Measures taken d/t PT note revealing pt with increased difficulty manipulating smaller ADL supplies during reaching activities in PT session   11/15/23: Grip strength: R: 70 lbs, L: 63 lbs Pinch strength: Lateral: R: 16 lbs, L: 15 lbs; 3 point pinch: R: 12 lbs, L: 13 lbs 9 hole peg test: Right: 25 sec, L 49 sec   12/25/23: Grip strength: Right: 70 lbs; Left: 65 lbs  9 hole peg test: Right: 24 sec; Left: 3 trials: 54 sec, 1 min, 56 sec  01/24/24: Grip grip: Right: 76 lbs; Left: 67 lbs Lateral pinch: R: 17 lbs, L: 17 lbs; 3 point pinch: R: 13 lbs, L: 14 lbs  9 Hole Peg Test: R: 26 sec, L: 50 sec  02/28/24: Grip: Right: 85 lbs; Left: 71 lbs Lateral pinch: R: 20 lbs, L: 19 lbs; 3 point pinch: R: 17 lbs, L: 18 lbs  9 Hole Peg  Test: R: 29 sec, L: 40 sec   04/10/24: Grip: Right: 85 lbs; Left: 81 lbs;   Lateral pinch: R: 18 lbs, L: 18 lbs; 3 point pinch: R: 17 lbs, L: 18 lbs   9 hole Peg Test: R: 25 sec, L 32 sec   SENSATION: WFL  EDEMA: No visible edema in BUEs  MUSCLE TONE: BUEs WNL  COGNITION: Overall cognitive status: Within functional limits for tasks assessed  VISION: wears glasses all the time, no  reports of diplopia  PERCEPTION: Not tested  PRAXIS: WFL  OBSERVATIONS:  Pt pleasant, cooperative, and motivated to work towards improving indep with ADLs.                                                                                                     TREATMENT DATE: 04/24/24 Therapeutic Activity: -Practiced weight shifting with increased anterior pelvic tilt, working to progress from a scoot pivot to a squat pivot transfer.  Pt completed 1 trial wc>mat table, and 1 trial mat table>wc, utilizing sliding board for guarding, with OT providing, tactile cues to engage quads, min A for LE positioning throughout transfer and mod vc for anterior pelvic tilt to allow increased lift of buttocks from chair. -Participated in dynamic sitting/core stability/and GMC working towards improved strength and coordination for ADLs and functional mobility.  Pt worked on reaching IF to tip of a dowel while seated without UB support on edge of mat table and feet positioned on floor.  Dowel position alternated and placed to promote reaching forward, overhead, below the knees, and R/L laterally outside BOS in sitting.  Very occasional balance check requiring 1 hand support on mat table to return self to midline sitting after an extended reach laterally to the R or L with constant close supv provided.   Self Care: -STS and static standing for Adl completion: able to achieve full standing position 1 of 5 trials, tolerating 3 min 10 sec in standing with BUEs supported on RW, CGA from OT for balance, and tactile cues for tightening  quads, core, and buttocks for maximizing stability.  Pt required min A to block R knee for STS on all 5 trials, mod vc for anterior WS, completing 75% of full standing position on 4 of 5 trials.    PATIENT EDUCATION: Education details: squat pivot technique Person educated: Patient Education method: Explanation, Actor cues, and Verbal cues Education comprehension: verbalized understanding and returned demonstration  HOME EXERCISE PROGRAM: IR A/AAROM to promote shoulder flexibility for peri care/bathing  GOALS: Goals reviewed with patient? Yes  SHORT TERM GOALS: Target date: 12/25/23  Pt will perform bed bath with set up only. Baseline: Eval: Min-mod A for posterior washing; 09/28/23: Min A for posterior washing; 11/13/23: Able to manage bed bath but sister helps with thoroughness, per pt's preference; pt in agreement to trial bath sitting EOB or in wc before next OT session. Goal status: achieved/d/c  2.  Pt will utilize sliding board for wc<>drop arm commode transfer with SBA. Baseline: Eval: Currently using electric lift for transfer to [facility]; 09/28/23: Not yet completed at home but transfers to commode have been easier without sliding board d/t pt implementing a scoot pivot. Goal status: d/c (pt does not prefer sliding board for commode transfer)  3.  Pt will be indep to perform HEP for maintaining BUE strength for ADLs and functional transfers. Baseline: Eval: HEP not yet initiated; 09/28/23: Pt is working on core stability exercises from wc, cane stretches for bilat shoulder IR, and tricep extension via wc pushups; 11/13/23: indep Goal status: achieved  LONG TERM  GOALS: Target date: 04/29/24  Pt will perform sink bath with set up A. (Revised on 11/13/23 from min A to set up) Baseline: Eval: Currently bed bathing d/t inability to stand at sink without electric lift (lift does not fit into pt's bathroom); 09/28/23: still completing bed bath as pt is not yet able to stand; 11/13/23: Not yet  attempted; pt encouraged to attempt before next OT session now that bed bath goal has been met; 12/20/23: Pt is now completing sponge bath with min A while seated EOB (caregiver assists with washing peri area d/t no bed rail to aid in R lateral lean); 01/24/24: Performing with set up on EOB, completing lateral weight shifts to wash posterior/peri area with wc in front of pt and bed rail for LUE support and foot of bed for RUE support  Goal status: achieved  2.  Pt will perform wc<>tub bench transfer with min A. Baseline: Eval: Unable; 09/28/23: Performed in OT clinic with min A, but not yet in the home Goal status: d/c (pt prefers sink bath)  3.  Pt will perform squat pivot transfer wc<>BSC with SBA.  Baseline: Eval: Eval: Currently using electric lift for transfer to [facility]; 09/28/23: Not yet completed in clinic with [facility], and using lift for [facility] in the home still; 11/13/23: Still using Camie lift at home and in clinic transfers are all scoot rather than squat pivots d/t LE weakness; 12/20/23: Pt reports that she plans to use her Camie lift until she can ambulate to her [facility] d/t limited space for wc in her desired location for [facility] at home. Goal status: d/c  4. Pt will perform scoot transfer wc<>toilet using grab bars with supv to allow toileting in community setting.  Baseline: Eval: Pt limits community outings d/t requiring use of lift for [facility] transfers; 09/28/23: Pt has performed 2 successful scoot pivot  Transfers to toilet in OT clinic with min A, extra time.  Further trials with clothing management on toilet needed, but pt can lower and hike  pants while sititng edge of mat via lateral leaning and min A to maintain core stability; 11/13/23: Pt performs with CGA and extra time; 12/19/23: Pt  performs with close supv on most attempts, occasional CGA; efficiency is improving; 01/24/24: ongoing improvements; able to complete in fewer scoots (3 scoots each way from wc<>toilet) d/t improve engagement of Les and  improved anterior weight shift; occasional min guard-supv; pt completes with therapist in clinic, but not yet in other community settings with sister, and not yet voided on toilet in community. 02/28/24: has not trialed in community, completes with SBA; 04/10/24: deferred in recent visits to focus on standing for sink bath, per pt request    Goal status: ongoing  5.  Pt will complete seated clothing management with min A to enable voiding in handicapped stall within community. Baseline: Recert 11/13/23: Pt can transfer to toilet with increased time and effort, but has not yet attempted clothing management or voiding while seated on commode in community setting.  Pt can lower pants to knees while seated in wc, but requires mod A to hike over hips from seated position; 12/20/23: Pt fully lowers panties and shorts while seated on commode with supv, can hike clothing to her her upper thighs, transfers back to wc, and then performs a tricep extension from wc level to allow caregiver to hike clothing remainder of the way for purposes of EC (min A for clothing management component) Goal status: achieved  6.  Pt  will tolerate standing x1 min with close supv and BUE support to manage clothing in prep for toileting. Baseline: Eval: Currently standing in electric lift only, or within parallel bars without lift; 09/28/23: Not yet attempted; 11/13/23: Pt is able to perform static standing with heavy BUE support on parallel bars with PT; 12/19/22: Not attempted recently d/t new intermittent clonus in BLEs; 01/24/24: Tolerated standing 1 min today at sink with min A for ascent, OT assist to block R knee from buckling. 02/28/24: tolerates up to 1 min standing, difficulty achieving stand 2/2 fatigue from PT; 04/10/24: Pt is inconsistent to achieve full standing position, but when able, can tolerate 1-2 min with CGA-min A and RW for support.  Pt was able to remove R hand briefly from walker (~3 sec or less) to touch R buttocks to  simulate reach for clothing management and peri care.  Goal status: in progress  7.  Pt will increase bilat grip strength in order to securely grasp walker sufficiently for functional transfers and ambulation.  Baseline: Recert 11/13/23: TBD (PT note read following OT session, indicating pt with difficulty securing items in hand);  11/15/23: Grip strength: R: 70 lbs, L: 63 lbs; 12/25/23: R 76 lbs, L 67 lbs; sufficient for functional transfers/amb  Goal status: achieved  8.  Pt will increase L [facility] skills in order to manipulate small ADL supplies with reduced dropping as indicated by 20 sec or more improvement in 9 hole peg test. Baseline: Recert 11/13/23: 9 hole peg test TBD (PT note read following OT session, indicating pt with difficulty manipulating and securing items in hands); 11/15/23: Right: 25 sec, L 49 sec; 12/25/23: Right: 24 sec; Left: 3 trials: 54 sec, 1 min, 56 sec; 01/24/24: R: 26 sec, L: 50 sec. 02/28/24: L: 40 sec; 04/10/24: R 25 sec, L 32 sec   Goal status: in progress  ASSESSMENT: CLINICAL IMPRESSION: STS and static standing for Adl completion: able to achieve full standing position 1 of 5 trials, tolerating 3 min 10 sec in standing with BUEs supported on RW, CGA from OT for balance, and tactile cues for tightening quads, core, and buttocks for maximizing stability.  Pt required min A to block R knee for STS on all 5 trials, mod vc for anterior WS, completing 75% of full standing position on 4 of 5 trials.  Pt tolerated edge of mat sitting with feet supported well with intermittent rest breaks to perform above noted reaching activity.  Very occasional balance check requiring 1 hand support on mat table to return self to midline sitting after an extended reach laterally to the R or L with constant close supv provided.  Pt demonstrated fair lift of buttocks during wc<>mat table transfer, but still requiring mod vc for increasing anterior pelvic tilt, vc for hand placement to promote anterior WS,  and pt requires multiple scoots (7+ scoots per transfer) to complete full transfer, still completing a scoot pivot on sliding board rather than a squat pivot.  Pt continues to rely primarily on BUEs for lifting d/t LE weakness, but is beginning to engage quads some with vc/tc.  Pt will continue to benefit from skilled OT to work towards above noted goals in OT poc, working to maximize indep with daily tasks while reducing burden of care on caregivers.   PERFORMANCE DEFICITS: in functional skills including ADLs, IADLs, strength, pain, flexibility, Gross motor control, mobility, balance, body mechanics, endurance, and decreased knowledge of use of DME, and psychosocial skills including coping strategies, environmental  adaptation, habits, and routines and behaviors.   IMPAIRMENTS: are limiting patient from ADLs, IADLs, and social participation.   CO-MORBIDITIES: has co-morbidities such as neuropathy, back pain, obesity, MS, HTN that affects occupational performance. Patient will benefit from skilled OT to address above impairments and improve overall function.  MODIFICATION OR ASSISTANCE TO COMPLETE EVALUATION: No modification of tasks or assist necessary to complete an evaluation.  OT OCCUPATIONAL PROFILE AND HISTORY: Detailed assessment: Review of records and additional review of physical, cognitive, psychosocial history related to current functional performance.  CLINICAL DECISION MAKING: Moderate - several treatment options, min-mod task modification necessary  REHAB POTENTIAL: Good  EVALUATION COMPLEXITY: Moderate  PLAN:  OT FREQUENCY: 2x/week  OT DURATION: 12 weeks  PLANNED INTERVENTIONS: 97168 OT Re-evaluation, 97535 self care/ADL training, 02889 therapeutic exercise, 97530 therapeutic activity, 97112 neuromuscular re-education, 97140 manual therapy, 97116 gait training, 02989 moist heat, 97010 cryotherapy, balance training, functional mobility training, psychosocial skills training,  energy conservation, coping strategies training, patient/family education, and DME and/or AE instructions  RECOMMENDED OTHER SERVICES: None at this time (Pt currently receiving PT services in this clinic)  CONSULTED AND AGREED WITH PLAN OF CARE: Patient  PLAN FOR NEXT SESSION: see above  Inocente Blazing, MS, OTR/L  Inocente MARLA Blazing, OT 04/25/2024, 11:30 AM     "

## 2024-04-27 ENCOUNTER — Encounter: Payer: Self-pay | Admitting: Internal Medicine

## 2024-04-27 NOTE — Assessment & Plan Note (Signed)
 Currently taking micardis . Blood pressure as outlined. Follow pressures. Follow metabolic panel.

## 2024-04-27 NOTE — Assessment & Plan Note (Signed)
 Low-carb diet and exercise.  Follow met b and A1c.

## 2024-04-27 NOTE — Assessment & Plan Note (Signed)
 Neurology 04/2024 -  She tolerated the Mavenclad well.  She will do the second year of Mavenclad around May and June 2026.  When she comes back to see us  in 5 months we will check blood work for chronic infections. Continue PT to help with recovery from recent fracture/deconditioning.  Continue baclofen  to 20 mg 4 times a day.  Additionally she takes tizanidine  twice a day.

## 2024-04-27 NOTE — Assessment & Plan Note (Signed)
Continue crestor.  Low cholesterol diet and exercise.  Follow lipid panel.  

## 2024-04-27 NOTE — Assessment & Plan Note (Signed)
 Documented history of osteoporosis. Recent fracture. Discussed need for f/u bone density in the future.

## 2024-04-27 NOTE — Assessment & Plan Note (Signed)
 Recently found to be elevated. Recent abdominal ultrasound - ok. Follow liver function tests.

## 2024-04-29 ENCOUNTER — Ambulatory Visit: Payer: PPO

## 2024-04-29 ENCOUNTER — Ambulatory Visit

## 2024-04-29 DIAGNOSIS — G35D Multiple sclerosis, unspecified: Secondary | ICD-10-CM

## 2024-04-29 DIAGNOSIS — R2681 Unsteadiness on feet: Secondary | ICD-10-CM

## 2024-04-29 DIAGNOSIS — R269 Unspecified abnormalities of gait and mobility: Secondary | ICD-10-CM

## 2024-04-29 DIAGNOSIS — M6281 Muscle weakness (generalized): Secondary | ICD-10-CM

## 2024-04-29 DIAGNOSIS — R278 Other lack of coordination: Secondary | ICD-10-CM

## 2024-04-29 DIAGNOSIS — R262 Difficulty in walking, not elsewhere classified: Secondary | ICD-10-CM

## 2024-04-29 NOTE — Therapy (Signed)
 " OUTPATIENT OCCUPATIONAL THERAPY NEURO TREATMENT NOTE  Patient Name: Ceniya Fowers Balogh MRN: [id number] DOB:1960/12/28, 63 y.o., female Today's Date: 04/29/2024  PCP: Dr. Layman Piety   Neurologist Dr. Suanne at [facility] REFERRING PROVIDER: Dr. Layman Piety    END OF SESSION:  OT End of Session - 04/29/24 1920     Visit Number 65    Number of Visits 67    Date for Recertification  04/29/24    Authorization Time Period Reporting period beginning 04/10/24    Progress Note Due on Visit 70    OT Start Time 1100    OT Stop Time 1140    OT Time Calculation (min) 40 min    Equipment Utilized During Treatment manual wc    Activity Tolerance Patient tolerated treatment well    Behavior During Therapy [facility] for tasks assessed/performed         Past Medical History:  Diagnosis Date   Abdominal pain, right upper quadrant    Back pain    Calculus of kidney 12/09/2013   Chronic back pain    unspecified   Chronic left shoulder pain 07/19/2015   Complication of anesthesia    Functional disorder of bladder    other   Galactorrhea 11/26/2014   Chronic    Hereditary and idiopathic neuropathy 08/19/2013   High cholesterol    History of kidney stones    HPV test positive    Hypercholesteremia 08/19/2013   Hypertension    Hypertension    Hypothyroidism    Incomplete bladder emptying    Microscopic hematuria    MS (multiple sclerosis)    Muscle spasticity 05/21/2014   Nonspecific findings on examination of urine    other   Osteopenia    PONV (postoperative nausea and vomiting)    Prediabetes    Sickle cell trait    Status post laparoscopic supracervical hysterectomy 11/26/2014   Tobacco user 11/26/2014   Wrist fracture    Past Surgical History:  Procedure Laterality Date   bilateral tubal ligation  1996   BREAST CYST EXCISION Left 2002   CYST EXCISION Left 05/10/2022   Procedure: CYST REMOVAL;  Surgeon: Rodolph Romano, MD;  Location: [facility] ORS;  Service:  General;  Laterality: Left;   FRACTURE SURGERY     KNEE SURGERY     right   LAPAROSCOPIC SUPRACERVICAL HYSTERECTOMY  08/05/2013   ORIF WRIST FRACTURE Left 01/17/2017   Procedure: OPEN REDUCTION INTERNAL FIXATION (ORIF) WRIST FRACTURE;  Surgeon: Leora Lynwood SAUNDERS, MD;  Location: [facility] ORS;  Service: Orthopedics;  Laterality: Left;   RADIOLOGY WITH ANESTHESIA N/A 03/18/2020   Procedure: MRI WITH ANESTHESIA CERVICAL SPINE AND BRAIN  WITH AND WITHOUT CONTRAST;  Surgeon: Radiologist, Medication, MD;  Location: [facility] OR;  Service: Radiology;  Laterality: N/A;   right tibia fracture Right    2024   TUBAL LIGATION Bilateral    VAGINAL HYSTERECTOMY  03/2006   VAGINAL HYSTERECTOMY     2014   Patient Active Problem List   Diagnosis Date Noted   Health care maintenance 04/21/2024   SOB (shortness of breath) on exertion 03/12/2024   Hyperglycemia 01/09/2024   Abnormal LFTs (liver function tests) 09/17/2023   Sinus tachycardia 07/07/2023   Aortic atherosclerosis 01/18/2023   Prediabetes 01/18/2023   Hypokalemia 07/29/2022   Weakness of both lower extremities 07/29/2022   Abscess of left groin 11/15/2021   Abnormal LFTs 11/15/2021   Acute respiratory disease due to COVID-19 virus 11/21/2020   Fatigue due to excessive exertion  Hypoalbuminemia due to protein-calorie malnutrition    Neurogenic bowel    Neurogenic bladder    Labile blood pressure    Neuropathic pain    Multiple sclerosis, relapsing-remitting 07/09/2019   Abscess of female pelvis    SVT (supraventricular tachycardia)    Radial styloid tenosynovitis 03/12/2018   Wheelchair dependence 02/27/2018   Localized osteoporosis with current pathological fracture with routine healing 01/19/2017   Wrist fracture 01/16/2017   Sprain of ankle 03/23/2016   Closed fracture of lateral malleolus 03/16/2016   Depression screening 01/24/2016   Blood pressure elevated without history of HTN 10/25/2015   Essential hypertension 10/25/2015    Multiple sclerosis 10/02/2015   Chronic left shoulder pain 07/19/2015   Multiple sclerosis exacerbation 07/14/2015   MS (multiple sclerosis) 11/26/2014   Increased body mass index 11/26/2014   HPV test positive 11/26/2014   Status post laparoscopic supracervical hysterectomy 11/26/2014   Galactorrhea 11/26/2014   Back ache 05/21/2014   Adiposity 05/21/2014   Disordered sleep 05/21/2014   Muscle spasticity 05/21/2014   Spasticity 05/21/2014   Calculus of kidney 12/09/2013   Renal colic 12/09/2013   Hypercholesteremia 08/19/2013   Hereditary and idiopathic neuropathy 08/19/2013   Hypercholesterolemia without hypertriglyceridemia 08/19/2013   Bladder infection, chronic 07/25/2012   Disorder of bladder function 07/25/2012   Incomplete bladder emptying 07/25/2012   Microscopic hematuria 07/25/2012   Right upper quadrant pain 07/25/2012   ONSET DATE: 5/24 Amon and broke R leg which caused beginning of ADL decline; diagnosed with MS in 1995)  REFERRING DIAG: MS  THERAPY DIAG:  Muscle weakness (generalized)  Other lack of coordination  Multiple sclerosis exacerbation  Rationale for Evaluation and Treatment: Rehabilitation  SUBJECTIVE:  SUBJECTIVE STATEMENT: Pt reports working on standing in PT today, but felt limited.  Pt accompanied by: self  PERTINENT HISTORY: Pt reports increased difficulty with basic self care tasks since breaking her R leg last May.  Since that time, mobility and ADLs have declined, with pt requiring assist from private caregivers and family to manage bathing, toileting, dressing, and functional transfers.  PRECAUTIONS: Fall  WEIGHT BEARING RESTRICTIONS: No  PAIN: 04/29/24: No pain today  Are you having pain? No; occasional pain in R lower back, but not today  FALLS: Has patient fallen in last 6 months? No Last fall was May 17 of 2024  LIVING ENVIRONMENT: Lives with: lives with their family (including mother who has dementia and sister [name])   Lives in: 2 level home but pt resides on main level Stairs: ramp  Has following equipment at home: Wheelchair (manual), Shower bench, and hand held shower shower, 1 grab bar in the shower, 3in1 commode, sit to stand lift (electric), FWW, hospital bed  PLOF: modified indep with ADL/IADLs prior to May of 7975   PATIENT GOALS: Increase independence with basic self care tasks  OBJECTIVE:  Note: Objective measures were completed at Evaluation unless otherwise noted.  HAND DOMINANCE: Right  ADLs:  Overall ADLs: assist provided from paid caregiver and sister Transfers/ambulation related to ADLs: sit to stand lift for all transfers, set up for sliding board in/out of bed  Eating: indep  Grooming: modified indep (wc level in mom's bathroom)  UB Dressing: set up (can't access her bedroom closet)  LB Dressing: able to sit on side of bed to don all LB clothing; assist to hike pants/underwear Toileting: assist with clothing management d/t standing within electric lift Bathing: bed bath with sister helping to wash backside Tub Shower transfers: N/A; pt has walk  in shower in mom's bathroom (wc does fit in this bathroom but pt reports inability to transfer from wc and requires arm rests on a bench for a successful transfer attempt from any DME).  Tub bench is in pt's bathroom, but lift does not fit through bathroom doorway and pt reports inability to transfer up from tub bench (currently unable to manage either transfer)   Equipment: see above  IADLs: Shopping: [employer] for shopping Light housekeeping: modified indep from wc level Meal Prep: modified indep from wc level Community mobility: relies on community transportation or family members Medication management: indep Landscape architect: indep Handwriting: NT; pt denies any FMC challenges  MOBILITY STATUS: Hx of falls  POSTURE COMMENTS:  Rounded shoulders, forward head, anterior pelvic tilt, and weight shift left   ACTIVITY  TOLERANCE: Eval: Activity tolerance: Per PT; currently standing 30-60 sec within Light Gait harness/lift; currently non-ambulatory  01/24/24: Standing at sink: 1 minute; min A and 5 attempts to achieve fully erect standing position with BUEs supported on sink countertop, OT assist to block R knee to prevent buckling   02/28/24: x4 attempts, achieves 75% upright standing with MIN A to block knees.   UPPER EXTREMITY ROM:  BUEs WFL  UPPER EXTREMITY MMT:     MMT Right eval Left eval  Shoulder flexion 4+ 4  Shoulder abduction 4+ 4  Shoulder adduction    Shoulder extension    Shoulder internal rotation 4+ 4+  Shoulder external rotation 4 4  Middle trapezius    Lower trapezius    Elbow flexion 4+ 4+  Elbow extension 4+ 4+  Wrist flexion 4+ 4+  Wrist extension 4+ 4+  Wrist ulnar deviation    Wrist radial deviation    Wrist pronation    Wrist supination    (Blank rows = not tested)  HAND FUNCTION/COORDINATION:  R/L WNL; pt denies any coordination deficits/ able to manipulate pills/clothing fasteners/etc without difficulty  11/15/23: *Measures taken d/t PT note revealing pt with increased difficulty manipulating smaller ADL supplies during reaching activities in PT session   11/15/23: Grip strength: R: 70 lbs, L: 63 lbs Pinch strength: Lateral: R: 16 lbs, L: 15 lbs; 3 point pinch: R: 12 lbs, L: 13 lbs 9 hole peg test: Right: 25 sec, L 49 sec   12/25/23: Grip strength: Right: 70 lbs; Left: 65 lbs  9 hole peg test: Right: 24 sec; Left: 3 trials: 54 sec, 1 min, 56 sec  01/24/24: Grip grip: Right: 76 lbs; Left: 67 lbs Lateral pinch: R: 17 lbs, L: 17 lbs; 3 point pinch: R: 13 lbs, L: 14 lbs  9 Hole Peg Test: R: 26 sec, L: 50 sec  02/28/24: Grip: Right: 85 lbs; Left: 71 lbs Lateral pinch: R: 20 lbs, L: 19 lbs; 3 point pinch: R: 17 lbs, L: 18 lbs  9 Hole Peg Test: R: 29 sec, L: 40 sec   04/10/24: Grip: Right: 85 lbs; Left: 81 lbs;   Lateral pinch: R: 18 lbs, L: 18 lbs; 3 point pinch: R:  17 lbs, L: 18 lbs   9 hole Peg Test: R: 25 sec, L 32 sec   SENSATION: WFL  EDEMA: No visible edema in BUEs  MUSCLE TONE: BUEs WNL  COGNITION: Overall cognitive status: Within functional limits for tasks assessed  VISION: wears glasses all the time, no reports of diplopia  PERCEPTION: Not tested  PRAXIS: WFL  OBSERVATIONS:  Pt pleasant, cooperative, and motivated to work towards improving indep with ADLs.  TREATMENT DATE: 04/29/24 Therapeutic Activity: -Practiced weight shifting with increased anterior pelvic tilt, working to progress from a scoot pivot to a squat pivot transfer.  Pt completed 2 trials wc<>mat table, utilizing sliding board for guarding, with OT providing, tactile cues to engage quads, min A for LE positioning throughout transfer and mod vc for anterior pelvic tilt to allow increased lift of buttocks from chair. -Participated in dynamic sitting/core stability/and UB/LB GMC working towards improved strength and coordination for ADLs and functional mobility.  Pt worked on catching ball overhead and R/L of midline, and kicking ball with while seated edge of mat and unsupported by BUEs.  Very occasional balance check requiring 1 hand support on mat table to return self to midline sitting after an extended reach to catch ball to the R or L of midline; constant close supv provided.   PATIENT EDUCATION: Education details: squat pivot technique Person educated: Patient Education method: Explanation, Actor cues, and Verbal cues Education comprehension: verbalized understanding and returned demonstration  HOME EXERCISE PROGRAM: IR A/AAROM to promote shoulder flexibility for peri care/bathing  GOALS: Goals reviewed with patient? Yes  SHORT TERM GOALS: Target date: 12/25/23  Pt will perform bed bath with set up only. Baseline: Eval: Min-mod A for posterior washing;  09/28/23: Min A for posterior washing; 11/13/23: Able to manage bed bath but sister helps with thoroughness, per pt's preference; pt in agreement to trial bath sitting EOB or in wc before next OT session. Goal status: achieved/d/c  2.  Pt will utilize sliding board for wc<>drop arm commode transfer with SBA. Baseline: Eval: Currently using electric lift for transfer to [facility]; 09/28/23: Not yet completed at home but transfers to commode have been easier without sliding board d/t pt implementing a scoot pivot. Goal status: d/c (pt does not prefer sliding board for commode transfer)  3.  Pt will be indep to perform HEP for maintaining BUE strength for ADLs and functional transfers. Baseline: Eval: HEP not yet initiated; 09/28/23: Pt is working on core stability exercises from wc, cane stretches for bilat shoulder IR, and tricep extension via wc pushups; 11/13/23: indep Goal status: achieved  LONG TERM GOALS: Target date: 04/29/24  Pt will perform sink bath with set up A. (Revised on 11/13/23 from min A to set up) Baseline: Eval: Currently bed bathing d/t inability to stand at sink without electric lift (lift does not fit into pt's bathroom); 09/28/23: still completing bed bath as pt is not yet able to stand; 11/13/23: Not yet attempted; pt encouraged to attempt before next OT session now that bed bath goal has been met; 12/20/23: Pt is now completing sponge bath with min A while seated EOB (caregiver assists with washing peri area d/t no bed rail to aid in R lateral lean); 01/24/24: Performing with set up on EOB, completing lateral weight shifts to wash posterior/peri area with wc in front of pt and bed rail for LUE support and foot of bed for RUE support  Goal status: achieved  2.  Pt will perform wc<>tub bench transfer with min A. Baseline: Eval: Unable; 09/28/23: Performed in OT clinic with min A, but not yet in the home Goal status: d/c (pt prefers sink bath)  3.  Pt will perform squat pivot transfer wc<>BSC  with SBA.  Baseline: Eval: Eval: Currently using electric lift for transfer to [facility]; 09/28/23: Not yet completed in clinic with [facility], and using lift for [facility] in the home still; 11/13/23: Still using Camie lift at home and in  clinic transfers are all scoot rather than squat pivots d/t LE weakness; 12/20/23: Pt reports that she plans to use her Camie lift until she can ambulate to her [facility] d/t limited space for wc in her desired location for [facility] at home. Goal status: d/c  4. Pt will perform scoot transfer wc<>toilet using grab bars with supv to allow toileting in community setting.  Baseline: Eval: Pt limits community outings d/t requiring use of lift for [facility] transfers; 09/28/23: Pt has performed 2 successful scoot pivot  Transfers to toilet in OT clinic with min A, extra time.  Further trials with clothing management on toilet needed, but pt can lower and hike  pants while sititng edge of mat via lateral leaning and min A to maintain core stability; 11/13/23: Pt performs with CGA and extra time; 12/19/23: Pt  performs with close supv on most attempts, occasional CGA; efficiency is improving; 01/24/24: ongoing improvements; able to complete in fewer scoots (3 scoots each way from wc<>toilet) d/t improve engagement of Les and improved anterior weight shift; occasional min guard-supv; pt completes with therapist in clinic, but not yet in other community settings with sister, and not yet voided on toilet in community. 02/28/24: has not trialed in community, completes with SBA; 04/10/24: deferred in recent visits to focus on standing for sink bath, per pt request    Goal status: ongoing  5.  Pt will complete seated clothing management with min A to enable voiding in handicapped stall within community. Baseline: Recert 11/13/23: Pt can transfer to toilet with increased time and effort, but has not yet attempted clothing management or voiding while seated on commode in community setting.  Pt can lower pants to knees while  seated in wc, but requires mod A to hike over hips from seated position; 12/20/23: Pt fully lowers panties and shorts while seated on commode with supv, can hike clothing to her her upper thighs, transfers back to wc, and then performs a tricep extension from wc level to allow caregiver to hike clothing remainder of the way for purposes of EC (min A for clothing management component) Goal status: achieved  6.  Pt will tolerate standing x1 min with close supv and BUE support to manage clothing in prep for toileting. Baseline: Eval: Currently standing in electric lift only, or within parallel bars without lift; 09/28/23: Not yet attempted; 11/13/23: Pt is able to perform static standing with heavy BUE support on parallel bars with PT; 12/19/22: Not attempted recently d/t new intermittent clonus in BLEs; 01/24/24: Tolerated standing 1 min today at sink with min A for ascent, OT assist to block R knee from buckling. 02/28/24: tolerates up to 1 min standing, difficulty achieving stand 2/2 fatigue from PT; 04/10/24: Pt is inconsistent to achieve full standing position, but when able, can tolerate 1-2 min with CGA-min A and RW for support.  Pt was able to remove R hand briefly from walker (~3 sec or less) to touch R buttocks to simulate reach for clothing management and peri care.  Goal status: in progress  7.  Pt will increase bilat grip strength in order to securely grasp walker sufficiently for functional transfers and ambulation.  Baseline: Recert 11/13/23: TBD (PT note read following OT session, indicating pt with difficulty securing items in hand);  11/15/23: Grip strength: R: 70 lbs, L: 63 lbs; 12/25/23: R 76 lbs, L 67 lbs; sufficient for functional transfers/amb  Goal status: achieved  8.  Pt will increase L [facility] skills in order to  manipulate small ADL supplies with reduced dropping as indicated by 20 sec or more improvement in 9 hole peg test. Baseline: Recert 11/13/23: 9 hole peg test TBD (PT note read following  OT session, indicating pt with difficulty manipulating and securing items in hands); 11/15/23: Right: 25 sec, L 49 sec; 12/25/23: Right: 24 sec; Left: 3 trials: 54 sec, 1 min, 56 sec; 01/24/24: R: 26 sec, L: 50 sec. 02/28/24: L: 40 sec; 04/10/24: R 25 sec, L 32 sec   Goal status: in progress  ASSESSMENT: CLINICAL IMPRESSION: Pt tolerated edge of mat sitting with feet supported with intermittent rest breaks to perform above noted core stability activity.  Very occasional balance check requiring 1 hand support on mat table to return self to midline sitting after an extended reach laterally to the R or L with constant close supv provided.  Pt demonstrated fair lift of buttocks during wc<>mat table transfer, but still requiring mod vc for increasing anterior pelvic tilt, vc for hand placement to promote anterior WS, and pt requires multiple scoots (5+ scoots per transfer) to complete full transfer, still completing a scoot pivot on sliding board rather than a squat pivot.  Pt continues to rely primarily on BUEs for lifting d/t LE weakness, but is beginning to engage quads some with vc/tc.  Pt will continue to benefit from skilled OT to work towards above noted goals in OT poc, working to maximize indep with daily tasks while reducing burden of care on caregivers.   PERFORMANCE DEFICITS: in functional skills including ADLs, IADLs, strength, pain, flexibility, Gross motor control, mobility, balance, body mechanics, endurance, and decreased knowledge of use of DME, and psychosocial skills including coping strategies, environmental adaptation, habits, and routines and behaviors.   IMPAIRMENTS: are limiting patient from ADLs, IADLs, and social participation.   CO-MORBIDITIES: has co-morbidities such as neuropathy, back pain, obesity, MS, HTN that affects occupational performance. Patient will benefit from skilled OT to address above impairments and improve overall function.  MODIFICATION OR ASSISTANCE TO COMPLETE  EVALUATION: No modification of tasks or assist necessary to complete an evaluation.  OT OCCUPATIONAL PROFILE AND HISTORY: Detailed assessment: Review of records and additional review of physical, cognitive, psychosocial history related to current functional performance.  CLINICAL DECISION MAKING: Moderate - several treatment options, min-mod task modification necessary  REHAB POTENTIAL: Good  EVALUATION COMPLEXITY: Moderate  PLAN:  OT FREQUENCY: 2x/week  OT DURATION: 12 weeks  PLANNED INTERVENTIONS: 97168 OT Re-evaluation, 97535 self care/ADL training, 02889 therapeutic exercise, 97530 therapeutic activity, 97112 neuromuscular re-education, 97140 manual therapy, 97116 gait training, 02989 moist heat, 97010 cryotherapy, balance training, functional mobility training, psychosocial skills training, energy conservation, coping strategies training, patient/family education, and DME and/or AE instructions  RECOMMENDED OTHER SERVICES: None at this time (Pt currently receiving PT services in this clinic)  CONSULTED AND AGREED WITH PLAN OF CARE: Patient  PLAN FOR NEXT SESSION: see above  Inocente Blazing, MS, OTR/L  Inocente MARLA Blazing, OT 04/29/2024, 7:21 PM     "

## 2024-04-29 NOTE — Therapy (Signed)
 "       OUTPATIENT PHYSICAL THERAPY NEURO TREATMENT   Patient Name: Lisa Williams MRN: [id number] DOB:07-24-1960, 63 y.o., female Today's Date: 04/29/2024  PCP: Lenon Na  REFERRING PROVIDER: Lenon Na   END OF SESSION:  PT End of Session - 04/29/24 1029     Visit Number 79    Number of Visits 94    Date for Recertification  06/12/24   corrected   Progress Note Due on Visit 50    PT Start Time 1029    PT Stop Time 1059    PT Time Calculation (min) 30 min    Equipment Utilized During Treatment Gait belt    Activity Tolerance Patient tolerated treatment well;No increased pain    Behavior During Therapy [facility] for tasks assessed/performed            Past Medical History:  Diagnosis Date   Abdominal pain, right upper quadrant    Back pain    Calculus of kidney 12/09/2013   Chronic back pain    unspecified   Chronic left shoulder pain 07/19/2015   Complication of anesthesia    Functional disorder of bladder    other   Galactorrhea 11/26/2014   Chronic    Hereditary and idiopathic neuropathy 08/19/2013   High cholesterol    History of kidney stones    HPV test positive    Hypercholesteremia 08/19/2013   Hypertension    Hypertension    Hypothyroidism    Incomplete bladder emptying    Microscopic hematuria    MS (multiple sclerosis)    Muscle spasticity 05/21/2014   Nonspecific findings on examination of urine    other   Osteopenia    PONV (postoperative nausea and vomiting)    Prediabetes    Sickle cell trait    Status post laparoscopic supracervical hysterectomy 11/26/2014   Tobacco user 11/26/2014   Wrist fracture    Past Surgical History:  Procedure Laterality Date   bilateral tubal ligation  1996   BREAST CYST EXCISION Left 2002   CYST EXCISION Left 05/10/2022   Procedure: CYST REMOVAL;  Surgeon: Rodolph Romano, MD;  Location: [facility] ORS;  Service: General;  Laterality: Left;   FRACTURE SURGERY     KNEE SURGERY      right   LAPAROSCOPIC SUPRACERVICAL HYSTERECTOMY  08/05/2013   ORIF WRIST FRACTURE Left 01/17/2017   Procedure: OPEN REDUCTION INTERNAL FIXATION (ORIF) WRIST FRACTURE;  Surgeon: Leora Lynwood SAUNDERS, MD;  Location: [facility] ORS;  Service: Orthopedics;  Laterality: Left;   RADIOLOGY WITH ANESTHESIA N/A 03/18/2020   Procedure: MRI WITH ANESTHESIA CERVICAL SPINE AND BRAIN  WITH AND WITHOUT CONTRAST;  Surgeon: Radiologist, Medication, MD;  Location: [facility] OR;  Service: Radiology;  Laterality: N/A;   right tibia fracture Right    2024   TUBAL LIGATION Bilateral    VAGINAL HYSTERECTOMY  03/2006   VAGINAL HYSTERECTOMY     2014   Patient Active Problem List   Diagnosis Date Noted   Health care maintenance 04/21/2024   SOB (shortness of breath) on exertion 03/12/2024   Hyperglycemia 01/09/2024   Abnormal LFTs (liver function tests) 09/17/2023   Sinus tachycardia 07/07/2023   Aortic atherosclerosis 01/18/2023   Prediabetes 01/18/2023   Hypokalemia 07/29/2022   Weakness of both lower extremities 07/29/2022   Abscess of left groin 11/15/2021   Abnormal LFTs 11/15/2021   Acute respiratory disease due to COVID-19 virus 11/21/2020   Fatigue due to excessive exertion    Hypoalbuminemia due to protein-calorie  malnutrition    Neurogenic bowel    Neurogenic bladder    Labile blood pressure    Neuropathic pain    Multiple sclerosis, relapsing-remitting 07/09/2019   Abscess of female pelvis    SVT (supraventricular tachycardia)    Radial styloid tenosynovitis 03/12/2018   Wheelchair dependence 02/27/2018   Localized osteoporosis with current pathological fracture with routine healing 01/19/2017   Wrist fracture 01/16/2017   Sprain of ankle 03/23/2016   Closed fracture of lateral malleolus 03/16/2016   Depression screening 01/24/2016   Blood pressure elevated without history of HTN 10/25/2015   Essential hypertension 10/25/2015   Multiple sclerosis 10/02/2015   Chronic left shoulder pain 07/19/2015    Multiple sclerosis exacerbation 07/14/2015   MS (multiple sclerosis) 11/26/2014   Increased body mass index 11/26/2014   HPV test positive 11/26/2014   Status post laparoscopic supracervical hysterectomy 11/26/2014   Galactorrhea 11/26/2014   Back ache 05/21/2014   Adiposity 05/21/2014   Disordered sleep 05/21/2014   Muscle spasticity 05/21/2014   Spasticity 05/21/2014   Calculus of kidney 12/09/2013   Renal colic 12/09/2013   Hypercholesteremia 08/19/2013   Hereditary and idiopathic neuropathy 08/19/2013   Hypercholesterolemia without hypertriglyceridemia 08/19/2013   Bladder infection, chronic 07/25/2012   Disorder of bladder function 07/25/2012   Incomplete bladder emptying 07/25/2012   Microscopic hematuria 07/25/2012   Right upper quadrant pain 07/25/2012    ONSET DATE: 1995  REFERRING DIAG: MS  THERAPY DIAG:  Muscle weakness (generalized)  Multiple sclerosis exacerbation  Difficulty in walking, not elsewhere classified  Unsteadiness on feet  Abnormality of gait and mobility  Rationale for Evaluation and Treatment: Rehabilitation  SUBJECTIVE:                                                                                                                                                                                             SUBJECTIVE STATEMENT:  Patient late due to transportation issues.   Pt accompanied by: self  PERTINENT HISTORY:  Patient is returning to PT s/p hospitalization.  s/p ORIF of R tibia shaft fracture 09/23/2022. Patient has weakness in BLE with RLE>LLE. She drives with hand controls. Patient has been diagnosed with MS in 1995. PMH includes: back pain, CBP, chronic L shoulder pain, galactorrhea, neuropathy, HPV, hypercholestermeia, HTN, MS, osteopenia, PONV, wrist fracture. Additional order for other closed fracture of proximal end of R tibia with routine healing. Still has to use [employer].   PAIN:  Are you having pain? Occasional pain in RLE;  primarily in knee  PRECAUTIONS: Fall  RED FLAGS: None   WEIGHT BEARING RESTRICTIONS: No  FALLS: Has patient fallen  in last 6 months? No  LIVING ENVIRONMENT: Lives with: lives with their family Lives in: House/apartment Stairs: ramp Has following equipment at home: Vannie - 2 wheeled, Wheelchair (manual), shower chair, and Grab bars  PLOF: Independent with household mobility with device  PATIENT GOALS: to get her independence back. To be able to get into car, toilet, and get dressed independently  OBJECTIVE:  Note: Objective measures were completed at Evaluation unless otherwise noted.  DIAGNOSTIC FINDINGS: MRI of the brain 03/18/2020 showed multiple T2/FLAIR hyperintense foci in the periventricular, juxtacortical and deep white matter.  There were no infratentorial lesions noted.  None of the foci enhanced.   Due to severe claustrophobia, the study was done with conscious sedation in the hospital   COGNITION: Overall cognitive status: Within functional limits for tasks assessed   SENSATION: Lack of sensation in feet, loss in bilateral lateral aspect of knee  COORDINATION: Does not have the strength for functional LE heel slide test   MUSCLE TONE: BLE mild tone    POSTURE: rounded shoulders, forward head, anterior pelvic tilt, and weight shift left   LOWER EXTREMITY MMT:    MMT Right Eval Left Eval  Hip flexion 0.9 1.5  Hip extension    Hip abduction 1.8 2.6  Hip adduction 3.1 1.9  Hip internal rotation    Hip external rotation    Knee flexion 0.9 1.5  Knee extension 0.8 1.2  Ankle dorsiflexion    Ankle plantarflexion    Ankle inversion    Ankle eversion    (Blank rows = not tested)  BED MOBILITY:  Assess in future session due to limited time  TRANSFERS: Assistive device utilized: Bariatric RW with sit to stands, slide board to table  Sit to stand: unable to reach full stand Stand to sit: unable to reach full stand  Chair to chair: slide board with  CGA   GAIT: Unable to ambulate at this time.   FUNCTIONAL TESTS:  Sit to stand: tricep press with BUE; unable to bring arm to walker.    Function In Sitting Test (FIST)  (1/2 femur on surface; hips/knees flexed to 90deg)   - indicate bed or mat table / step stool if used  SCORING KEY: 4 = Independent (completes task independently & successfully) 3 = Verbal Cues/Increased Time (completes task independently & successfully and only needs more time/cues) 2 = Upper Extremity Support (must use UE for support or assistance to complete successfully) 1 = Needs Assistance (unable to complete w/o physical assist; DOCUMENT LEVEL: min, mod, max) 0 = Dependent (requires complete physical assist; unable to complete successfully even w/ physical assist)  Randomly Administer Once Throughout Exam  4 - Anterior Nudge (superior sternum)  4 - Posterior Nudge (between scapular spines)  4 - Lateral Nudge (to dominant side at acromion)     4 - Static sitting (30 seconds)  4 - Sitting, shake no (left and right)  4 - Sitting, eyes closed (30 seconds)   0 - Sitting, lift foot (dominant side, lift foot 1 inch twice)    2 - Pick up object from behind (object at midline, hands breadth posterior)  3 - Forward reach (use dominant arm, must complete full motion) 2 - Lateral reach (use dominant arm, clear opposite ischial tuberosity) 2 - Pick up object from floor (from between feet)   2 - Posterior scooting (move backwards 2 inches)  2 - Anterior scooting (move forward 2 inches)  2 - Lateral scooting (move to dominant side 2  inches)    TOTAL = 39/56  Notes/comments: slide board tranfer to/from table   MCD > 5 points MCID for IP REHAB > 6 points  Function In Sitting Test (FIST) 08/14/23 (1/2 femur on surface; hips/knees flexed to 90deg)   - indicate bed or mat table / step stool if used  SCORING KEY: 4 = Independent (completes task independently & successfully) 3 = Verbal Cues/Increased Time  (completes task independently & successfully and only needs more time/cues) 2 = Upper Extremity Support (must use UE for support or assistance to complete successfully) 1 = Needs Assistance (unable to complete w/o physical assist; DOCUMENT LEVEL: min, mod, max) 0 = Dependent (requires complete physical assist; unable to complete successfully even w/ physical assist)  Randomly Administer Once Throughout Exam  4 - Anterior Nudge (superior sternum)  4 - Posterior Nudge (between scapular spines)  4 - Lateral Nudge (to dominant side at acromion)     4 - Static sitting (30 seconds)  4 - Sitting, shake no (left and right)  4 - Sitting, eyes closed (30 seconds)   0 - Sitting, lift foot (dominant side, lift foot 1 inch twice)    4 - Pick up object from behind (object at midline, hands breadth posterior)  4 - Forward reach (use dominant arm, must complete full motion) 4 - Lateral reach (use dominant arm, clear opposite ischial tuberosity)  - Pick up object from floor (from between feet)   3 - Posterior scooting (move backwards 2 inches)  3 - Anterior scooting (move forward 2 inches)  3 - Lateral scooting (move to dominant side 2 inches)    TOTAL = 47/56  MCD > 5 points MCID for IP REHAB > 6 points   TREATMENT DATE: 04/29/2024   TherEx: To improve strength, endurance, mobility, and function of specific targeted muscle groups or improve joint range of motion or improve muscle flexibility  Hip abduction 15x isometric contraction into PT legs  Hip adduction 15x isometric contraction into PT legs Seated leg press 15x each LE Seated hamstring curl with slider 15x each LE   TherAct:  Seated sit to stand 5x with focus on LE activation ; one hand on chair ; x2 sets one hand on walker with seat cushion on.   Seated: lean forward grab ball and throw into hoop x 25 balls     PATIENT EDUCATION:  Education details: Pt educated throughout session about proper posture and technique with  exercises. Improved exercise technique, movement at target joints, use of target muscles after min to mod verbal, visual, tactile cues.  Person educated: Patient Education method: Explanation, Demonstration, Tactile cues, and Verbal cues Education comprehension: verbalized understanding, returned demonstration, verbal cues required, tactile cues required, and needs further education    HOME EXERCISE PROGRAM: Stand 3x/day in stander   GOALS: Goals reviewed with patient? Yes  SHORT TERM GOALS: Target date: 08/08/2023  Patient will be independent in home exercise program to improve strength/mobility for better functional independence with ADLs.  Baseline: 4/8: compliance Goal status: MET    LONG TERM GOALS: Target date: 03/20/2024  Patient will tolerate five consecutive stands with UE support from wheelchair to standing to improve functional mobility with additional cushion and RW.  Baseline: unable to perform 4/8:unable to perform full stand 5/20: perform next session 5/29: two full stands with CGA and cushion on her seat 7/31: able to stand 3 times from [facility]. With no addition cushion  in parallel bars. 12/27/2023= Will retest next session- concentrated  on just standing on past 2 visits 03/20/24:  Goal status: Ongoing   2.  Patient will ambulate 10 ft with bRW with wheelchair follow.  Baseline:  unable to ambulate 4/8: unable to ambulate 5/20: able to ambulate length of // bars in previous session 5/27: ambulate 2.5 lengths of // bars with two seated rest breaks with min A for weight shift and wheelchair follow  7/31: able to ambulate in parallel bars 3 ft with min assist and +2 follow in WC> difficulty advancing the LLE on this day. 12/27/2023- Patient unable to take a step forward today but was abel to take a step on previous visit- limited by clonus like trembling that has progressed over past month. 03/20/24: 26' at one time Goal status: MET   3.  Patient will improve FIST score >6  points to demonstrate improved stability and ability to perform ADLs.  Baseline:  3/6: 39/56 4/8: 47/ 56 5/20: 48/56 7/31:46/56 Goal status: MET  4.  Patient will perform toileting with mod I at home for improved independence.  Baseline: 3/6: requires assistance 4/8: requires dependence on machine 5/20: requires assistance with use of wipe buddy.  7/31: continues to require use of sara steady and wipe buddy for safety and hygiene. 12/27/2023- Patient reports that since she is unable to take steps that she continues to require use of sara steady and wipe buddy for safe personal hygiene. 03/20/24: Pt utilizing sara stedy to perform safe personal hygiene, however is no longer utilizing the wipe buddy. Goal status: PROGRESSING   5. Patient will perform > 5 min of static or dynamic standing with BUE and Min/mod assist  for improved LE strength and pre-gait function to assist with toileting and overall standing ability for improve functional independence.  Baseline: 12/27/2023-Patient was able to stand for just over 3 min today. 01/10/24: Pt able to stand 1 min 38 sec 02/14/24: Pt able to >3 minutes at this time. 03/20/24: Pt able to achieve >4 minutes at this time. Goal status: PROGRESSING  6.  Patient will ambulate 40 ft with BRW with wheelchair follow, in one attempt, without rest break. Baseline: 17' during one attempt. Goal status: NEW   ASSESSMENT:  CLINICAL IMPRESSION:  Patient session limited by late arrival due to transportation issues. Patient presents with excellent motivation throughout session. She is eager to progress her independent mobility. Pt will continue to benefit from skilled therapy to address remaining deficits in order to improve overall QoL and return to PLOF.        OBJECTIVE IMPAIRMENTS: Abnormal gait, cardiopulmonary status limiting activity, decreased activity tolerance, decreased balance, decreased coordination, decreased endurance, decreased mobility, difficulty  walking, decreased ROM, decreased strength, hypomobility, increased fascial restrictions, impaired perceived functional ability, impaired flexibility, impaired sensation, improper body mechanics, and postural dysfunction.   ACTIVITY LIMITATIONS: carrying, lifting, bending, sitting, standing, squatting, sleeping, stairs, transfers, bed mobility, continence, bathing, toileting, dressing, self feeding, reach over head, hygiene/grooming, locomotion level, and caring for others  PARTICIPATION LIMITATIONS: meal prep, cleaning, laundry, interpersonal relationship, driving, shopping, community activity, and church  PERSONAL FACTORS: Age, Past/current experiences, Time since onset of injury/illness/exacerbation, Transportation, and 3+ comorbidities: ack pain, CBP, chronic L shoulder pain, galactorrhea, neuropathy, HPV, hypercholestermeia, HTN, MS, osteopenia, PONV, wrist fracture are also affecting patient's functional outcome.   REHAB POTENTIAL: Good  CLINICAL DECISION MAKING: Evolving/moderate complexity  EVALUATION COMPLEXITY: Moderate  PLAN:  PT FREQUENCY: 2x/week  PT DURATION: 12 weeks  PLANNED INTERVENTIONS: 97164- PT Re-evaluation, 97110-Therapeutic exercises, 97530- Therapeutic activity, 97112-  Neuromuscular re-education, [phone]- Self Care, [id number]- Manual therapy, [phone]- Gait training, [phone]- Orthotic Fit/training, [phone]- Canalith repositioning, [id number]- Aquatic Therapy, [phone]- Electrical stimulation (unattended), [phone]- Electrical stimulation (manual), [id number]- Ultrasound, [id number]- Traction (mechanical), Patient/Family education, Balance training, Stair training, Taping, Dry Needling, Joint mobilization, Joint manipulation, Spinal manipulation, Spinal mobilization, Scar mobilization, Compression bandaging, Vestibular training, Visual/preceptual remediation/compensation, Cognitive remediation, DME instructions, Cryotherapy, Moist heat, and Biofeedback  PLAN FOR NEXT SESSION:  Strap stand frame with  more dynamic activities Sit to stands; continue supported walking, continue dynamic core stabilization training and seated reaching tasks to improve weight shifting.  Continue with ambulation in the hallway with the bariatric walker, sock, and use of cushion for easier lift off.   Bertha Earwood  Leopoldo, PT, DPT Physical Therapist - [facility]  Outpatient Physical Therapy- Main Campus [phone]    04/29/2024, 11:02 AM  "

## 2024-05-05 NOTE — Therapy (Signed)
 "       OUTPATIENT PHYSICAL THERAPY NEURO TREATMENT/ Physical Therapy Progress Note   Dates of reporting period  03/20/24   to   05/06/2024     Patient Name: Lisa Williams Littlejohn MRN: [id number] DOB:03/20/1961, 63 y.o., female Today's Date: 05/06/2024  PCP: Lenon Na  REFERRING PROVIDER: Lenon Na   END OF SESSION:  PT End of Session - 05/06/24 1011     Visit Number 80    Number of Visits 94    Date for Recertification  06/12/24   corrected   Progress Note Due on Visit 50    PT Start Time 1014    PT Stop Time 1058    PT Time Calculation (min) 44 min    Equipment Utilized During Treatment Gait belt    Activity Tolerance Patient tolerated treatment well;No increased pain    Behavior During Therapy [facility] for tasks assessed/performed             Past Medical History:  Diagnosis Date   Abdominal pain, right upper quadrant    Back pain    Calculus of kidney 12/09/2013   Chronic back pain    unspecified   Chronic left shoulder pain 07/19/2015   Complication of anesthesia    Functional disorder of bladder    other   Galactorrhea 11/26/2014   Chronic    Hereditary and idiopathic neuropathy 08/19/2013   High cholesterol    History of kidney stones    HPV test positive    Hypercholesteremia 08/19/2013   Hypertension    Hypertension    Hypothyroidism    Incomplete bladder emptying    Microscopic hematuria    MS (multiple sclerosis)    Muscle spasticity 05/21/2014   Nonspecific findings on examination of urine    other   Osteopenia    PONV (postoperative nausea and vomiting)    Prediabetes    Sickle cell trait    Status post laparoscopic supracervical hysterectomy 11/26/2014   Tobacco user 11/26/2014   Wrist fracture    Past Surgical History:  Procedure Laterality Date   bilateral tubal ligation  1996   BREAST CYST EXCISION Left 2002   CYST EXCISION Left 05/10/2022   Procedure: CYST REMOVAL;  Surgeon: Rodolph Romano, MD;   Location: [facility] OR[facility];  Service: General;  Laterality: Left;   FRACTURE SURGERY     KNEE SURGERY     right   LAPAROSCOPIC SUPRACERVICAL HYSTERECTOMY  08/05/2013   ORIF WRIST FRACTURE Left 01/17/2017   Procedure: OPEN REDUCTION INTERNAL FIXATION (ORIF) WRIST FRACTURE;  Surgeon: Leora Lynwood SAUNDERS, MD;  Location: [facility];  Service: Orthopedics;  Laterality: Left;   RADIOLOGY WITH ANESTHESIA N/A 03/18/2020   Procedure: MRI WITH ANESTHESIA CERVICAL SPINE AND BRAIN  WITH AND WITHOUT CONTRAST;  Surgeon: Radiologist, Medication, MD;  Location: [facility];  Service: Radiology;  Laterality: N/A;   right tibia fracture Right    2024   TUBAL LIGATION Bilateral    VAGINAL HYSTERECTOMY  03/2006   VAGINAL HYSTERECTOMY     2014   Patient Active Problem List   Diagnosis Date Noted   Health care maintenance 04/21/2024   SOB (shortness of breath) on exertion 03/12/2024   Hyperglycemia 01/09/2024   Abnormal LFTs (liver function tests) 09/17/2023   Sinus tachycardia 07/07/2023   Aortic atherosclerosis 01/18/2023   Prediabetes 01/18/2023   Hypokalemia 07/29/2022   Weakness of both lower extremities 07/29/2022   Abscess of left groin 11/15/2021   Abnormal LFTs 11/15/2021   Acute  respiratory disease due to COVID-19 virus 11/21/2020   Fatigue due to excessive exertion    Hypoalbuminemia due to protein-calorie malnutrition    Neurogenic bowel    Neurogenic bladder    Labile blood pressure    Neuropathic pain    Multiple sclerosis, relapsing-remitting 07/09/2019   Abscess of female pelvis    SVT (supraventricular tachycardia)    Radial styloid tenosynovitis 03/12/2018   Wheelchair dependence 02/27/2018   Localized osteoporosis with current pathological fracture with routine healing 01/19/2017   Wrist fracture 01/16/2017   Sprain of ankle 03/23/2016   Closed fracture of lateral malleolus 03/16/2016   Depression screening 01/24/2016   Blood pressure elevated without history of HTN 10/25/2015   Essential  hypertension 10/25/2015   Multiple sclerosis 10/02/2015   Chronic left shoulder pain 07/19/2015   Multiple sclerosis exacerbation 07/14/2015   MS (multiple sclerosis) 11/26/2014   Increased body mass index 11/26/2014   HPV test positive 11/26/2014   Status post laparoscopic supracervical hysterectomy 11/26/2014   Galactorrhea 11/26/2014   Back ache 05/21/2014   Adiposity 05/21/2014   Disordered sleep 05/21/2014   Muscle spasticity 05/21/2014   Spasticity 05/21/2014   Calculus of kidney 12/09/2013   Renal colic 12/09/2013   Hypercholesteremia 08/19/2013   Hereditary and idiopathic neuropathy 08/19/2013   Hypercholesterolemia without hypertriglyceridemia 08/19/2013   Bladder infection, chronic 07/25/2012   Disorder of bladder function 07/25/2012   Incomplete bladder emptying 07/25/2012   Microscopic hematuria 07/25/2012   Right upper quadrant pain 07/25/2012    ONSET DATE: 1995  REFERRING DIAG: MS  THERAPY DIAG:  Muscle weakness (generalized)  Multiple sclerosis exacerbation  Difficulty in walking, not elsewhere classified  Rationale for Evaluation and Treatment: Rehabilitation  SUBJECTIVE:                                                                                                                                                                                             SUBJECTIVE STATEMENT: Patient reports she is cold from the weather. Has been active.   Pt accompanied by: self  PERTINENT HISTORY:  Patient is returning to PT s/p hospitalization.  s/p ORIF of R tibia shaft fracture 09/23/2022. Patient has weakness in BLE with RLE>LLE. She drives with hand controls. Patient has been diagnosed with MS in 1995. PMH includes: back pain, CBP, chronic L shoulder pain, galactorrhea, neuropathy, HPV, hypercholestermeia, HTN, MS, osteopenia, PONV, wrist fracture. Additional order for other closed fracture of proximal end of R tibia with routine healing. Still has to use [employer].   PAIN:  Are you having pain? Occasional pain in RLE; primarily in knee  PRECAUTIONS: Fall  RED FLAGS: None   WEIGHT BEARING RESTRICTIONS: No  FALLS: Has patient fallen in last 6 months? No  LIVING ENVIRONMENT: Lives with: lives with their family Lives in: House/apartment Stairs: ramp Has following equipment at home: Vannie - 2 wheeled, Wheelchair (manual), shower chair, and Grab bars  PLOF: Independent with household mobility with device  PATIENT GOALS: to get her independence back. To be able to get into car, toilet, and get dressed independently  OBJECTIVE:  Note: Objective measures were completed at Evaluation unless otherwise noted.  DIAGNOSTIC FINDINGS: MRI of the brain 03/18/2020 showed multiple T2/FLAIR hyperintense foci in the periventricular, juxtacortical and deep white matter.  There were no infratentorial lesions noted.  None of the foci enhanced.   Due to severe claustrophobia, the study was done with conscious sedation in the hospital   COGNITION: Overall cognitive status: Within functional limits for tasks assessed   SENSATION: Lack of sensation in feet, loss in bilateral lateral aspect of knee  COORDINATION: Does not have the strength for functional LE heel slide test   MUSCLE TONE: BLE mild tone    POSTURE: rounded shoulders, forward head, anterior pelvic tilt, and weight shift left   LOWER EXTREMITY MMT:    MMT Right Eval Left Eval  Hip flexion 0.9 1.5  Hip extension    Hip abduction 1.8 2.6  Hip adduction 3.1 1.9  Hip internal rotation    Hip external rotation    Knee flexion 0.9 1.5  Knee extension 0.8 1.2  Ankle dorsiflexion    Ankle plantarflexion    Ankle inversion    Ankle eversion    (Blank rows = not tested)  BED MOBILITY:  Assess in future session due to limited time  TRANSFERS: Assistive device utilized: Bariatric RW with sit to stands, slide board to table  Sit to stand: unable to reach full stand Stand to sit:  unable to reach full stand  Chair to chair: slide board with CGA   GAIT: Unable to ambulate at this time.   FUNCTIONAL TESTS:  Sit to stand: tricep press with BUE; unable to bring arm to walker.    Function In Sitting Test (FIST)  (1/2 femur on surface; hips/knees flexed to 90deg)   - indicate bed or mat table / step stool if used  SCORING KEY: 4 = Independent (completes task independently & successfully) 3 = Verbal Cues/Increased Time (completes task independently & successfully and only needs more time/cues) 2 = Upper Extremity Support (must use UE for support or assistance to complete successfully) 1 = Needs Assistance (unable to complete w/o physical assist; DOCUMENT LEVEL: min, mod, max) 0 = Dependent (requires complete physical assist; unable to complete successfully even w/ physical assist)  Randomly Administer Once Throughout Exam  4 - Anterior Nudge (superior sternum)  4 - Posterior Nudge (between scapular spines)  4 - Lateral Nudge (to dominant side at acromion)     4 - Static sitting (30 seconds)  4 - Sitting, shake no (left and right)  4 - Sitting, eyes closed (30 seconds)   0 - Sitting, lift foot (dominant side, lift foot 1 inch twice)    2 - Pick up object from behind (object at midline, hands breadth posterior)  3 - Forward reach (use dominant arm, must complete full motion) 2 - Lateral reach (use dominant arm, clear opposite ischial tuberosity) 2 - Pick up object from floor (from between feet)   2 - Posterior scooting (move backwards 2 inches)  2 - Anterior scooting (  move forward 2 inches)  2 - Lateral scooting (move to dominant side 2 inches)    TOTAL = 39/56  Notes/comments: slide board tranfer to/from table   MCD > 5 points MCID for IP REHAB > 6 points  Function In Sitting Test (FIST) 08/14/23 (1/2 femur on surface; hips/knees flexed to 90deg)   - indicate bed or mat table / step stool if used  SCORING KEY: 4 = Independent (completes task  independently & successfully) 3 = Verbal Cues/Increased Time (completes task independently & successfully and only needs more time/cues) 2 = Upper Extremity Support (must use UE for support or assistance to complete successfully) 1 = Needs Assistance (unable to complete w/o physical assist; DOCUMENT LEVEL: min, mod, max) 0 = Dependent (requires complete physical assist; unable to complete successfully even w/ physical assist)  Randomly Administer Once Throughout Exam  4 - Anterior Nudge (superior sternum)  4 - Posterior Nudge (between scapular spines)  4 - Lateral Nudge (to dominant side at acromion)     4 - Static sitting (30 seconds)  4 - Sitting, shake no (left and right)  4 - Sitting, eyes closed (30 seconds)   0 - Sitting, lift foot (dominant side, lift foot 1 inch twice)    4 - Pick up object from behind (object at midline, hands breadth posterior)  4 - Forward reach (use dominant arm, must complete full motion) 4 - Lateral reach (use dominant arm, clear opposite ischial tuberosity)  - Pick up object from floor (from between feet)   3 - Posterior scooting (move backwards 2 inches)  3 - Anterior scooting (move forward 2 inches)  3 - Lateral scooting (move to dominant side 2 inches)    TOTAL = 47/56  MCD > 5 points MCID for IP REHAB > 6 points   TREATMENT DATE: 05/06/2024  Physical therapy treatment session today consisted of completing assessment of goals and administration of testing as demonstrated and documented in flow sheet, treatment, and goals section of this note. Addition treatments may be found below.   Patient will tolerate five consecutive stands with UE support from wheelchair to standing to improve functional mobility with additional cushion and RW:    Patient will perform toileting with mod I at home for improved independence.: still using machine, able to wipe independently.     Patient will ambulate 40 ft with BRW with wheelchair follow, in one attempt,  without rest break: 21.2 ft with BrW wheelchair follow and no sock   Patient will perform > 5 min of static or dynamic standing with BUE and Min/mod assist  for improved LE strength and pre-gait function to assist with toileting and overall standing ability for improve functional independence.  TherEx: Seated: Hamstring curl with slider, PT assist with knee extension 10x each LE; x 2 trials  15lb KB deadlift modified 10x   PATIENT EDUCATION:  Education details: Pt educated throughout session about proper posture and technique with exercises. Improved exercise technique, movement at target joints, use of target muscles after min to mod verbal, visual, tactile cues.  Person educated: Patient Education method: Explanation, Demonstration, Tactile cues, and Verbal cues Education comprehension: verbalized understanding, returned demonstration, verbal cues required, tactile cues required, and needs further education    HOME EXERCISE PROGRAM: Stand 3x/day in stander   GOALS: Goals reviewed with patient? Yes  SHORT TERM GOALS: Target date: 08/08/2023  Patient will be independent in home exercise program to improve strength/mobility for better functional independence with ADLs.  Baseline:  4/8: compliance Goal status: MET    LONG TERM GOALS: Target date: 06/12/24  Patient will tolerate five consecutive stands with UE support from wheelchair to standing to improve functional mobility with additional cushion and RW.  Baseline: unable to perform 4/8:unable to perform full stand 5/20: perform next session 5/29: two full stands with CGA and cushion on her seat 7/31: able to stand 3 times from [facility]. With no addition cushion  in parallel bars. 12/27/2023= Will retest next session- concentrated on just standing on past 2 visits 03/20/24:  Goal status: Ongoing   2.  Patient will ambulate 10 ft with bRW with wheelchair follow.  Baseline:  unable to ambulate 4/8: unable to ambulate 5/20: able to ambulate  length of // bars in previous session 5/27: ambulate 2.5 lengths of // bars with two seated rest breaks with min A for weight shift and wheelchair follow  7/31: able to ambulate in parallel bars 3 ft with min assist and +2 follow in WC> difficulty advancing the LLE on this day. 12/27/2023- Patient unable to take a step forward today but was abel to take a step on previous visit- limited by clonus like trembling that has progressed over past month. 03/20/24: 26' at one time Goal status: MET   3.  Patient will improve FIST score >6 points to demonstrate improved stability and ability to perform ADLs.  Baseline:  3/6: 39/56 4/8: 47/ 56 5/20: 48/56 7/31:46/56 Goal status: MET  4.  Patient will perform toileting with mod I at home for improved independence.  Baseline: 3/6: requires assistance 4/8: requires dependence on machine 5/20: requires assistance with use of wipe buddy.  7/31: continues to require use of sara steady and wipe buddy for safety and hygiene. 12/27/2023- Patient reports that since she is unable to take steps that she continues to require use of sara steady and wipe buddy for safe personal hygiene. 03/20/24: Pt utilizing sara stedy to perform safe personal hygiene, however is no longer utilizing the wipe buddy. 12/30: using sara stedy for toileting, able to clean independently  Goal status: PROGRESSING   5. Patient will perform > 5 min of static or dynamic standing with BUE and Min/mod assist  for improved LE strength and pre-gait function to assist with toileting and overall standing ability for improve functional independence.  Baseline: 12/27/2023-Patient was able to stand for just over 3 min today. 01/10/24: Pt able to stand 1 min 38 sec 02/14/24: Pt able to >3 minutes at this time. 03/20/24: Pt able to achieve >4 minutes at this time. Goal status: PROGRESSING  6.  Patient will ambulate 40 ft with BRW with wheelchair follow, in one attempt, without rest break. Baseline: 37' during  one attempt with sock 12/30: 21.2 ft with BrW wheelchair follow and no sock Goal status: Partially Met   ASSESSMENT:  CLINICAL IMPRESSION:  Patient is able to ambulate without a sock demonstrating improved L foot clearance. She is highly motivated for progression of independent mobility. Improved core and LE strength noted throughout session with patient tolerating modified deadlift and hamstring curl with slider.   Patient's condition has the potential to improve in response to therapy. Maximum improvement is yet to be obtained. The anticipated improvement is attainable and reasonable in a generally predictable time. Pt will continue to benefit from skilled therapy to address remaining deficits in order to improve overall QoL and return to PLOF.        OBJECTIVE IMPAIRMENTS: Abnormal gait, cardiopulmonary status limiting activity, decreased activity  tolerance, decreased balance, decreased coordination, decreased endurance, decreased mobility, difficulty walking, decreased ROM, decreased strength, hypomobility, increased fascial restrictions, impaired perceived functional ability, impaired flexibility, impaired sensation, improper body mechanics, and postural dysfunction.   ACTIVITY LIMITATIONS: carrying, lifting, bending, sitting, standing, squatting, sleeping, stairs, transfers, bed mobility, continence, bathing, toileting, dressing, self feeding, reach over head, hygiene/grooming, locomotion level, and caring for others  PARTICIPATION LIMITATIONS: meal prep, cleaning, laundry, interpersonal relationship, driving, shopping, community activity, and church  PERSONAL FACTORS: Age, Past/current experiences, Time since onset of injury/illness/exacerbation, Transportation, and 3+ comorbidities: ack pain, CBP, chronic L shoulder pain, galactorrhea, neuropathy, HPV, hypercholestermeia, HTN, MS, osteopenia, PONV, wrist fracture are also affecting patient's functional outcome.   REHAB POTENTIAL:  Good  CLINICAL DECISION MAKING: Evolving/moderate complexity  EVALUATION COMPLEXITY: Moderate  PLAN:  PT FREQUENCY: 2x/week  PT DURATION: 12 weeks  PLANNED INTERVENTIONS: 97164- PT Re-evaluation, 97110-Therapeutic exercises, 97530- Therapeutic activity, 97112- Neuromuscular re-education, 97535- Self Care, [id number]- Manual therapy, [phone]- Gait training, [phone]- Orthotic Fit/training, [phone]- Canalith repositioning, [id number]- Aquatic Therapy, [phone]- Electrical stimulation (unattended), [phone]- Electrical stimulation (manual), [id number]- Ultrasound, [id number]- Traction (mechanical), Patient/Family education, Balance training, Stair training, Taping, Dry Needling, Joint mobilization, Joint manipulation, Spinal manipulation, Spinal mobilization, Scar mobilization, Compression bandaging, Vestibular training, Visual/preceptual remediation/compensation, Cognitive remediation, DME instructions, Cryotherapy, Moist heat, and Biofeedback  PLAN FOR NEXT SESSION:  Strap stand frame with more dynamic activities Sit to stands; continue supported walking, continue dynamic core stabilization training and seated reaching tasks to improve weight shifting.  Continue with ambulation in the hallway with the bariatric walker, sock, and use of cushion for easier lift off.   Eberardo Demello  Leopoldo, PT, DPT Physical Therapist - [facility]  Outpatient Physical Therapy- Main Campus [phone]    05/06/2024, 10:59 AM  "

## 2024-05-06 ENCOUNTER — Ambulatory Visit: Payer: PPO

## 2024-05-06 ENCOUNTER — Ambulatory Visit

## 2024-05-06 DIAGNOSIS — G35D Multiple sclerosis, unspecified: Secondary | ICD-10-CM | POA: Diagnosis not present

## 2024-05-06 DIAGNOSIS — R278 Other lack of coordination: Secondary | ICD-10-CM

## 2024-05-06 DIAGNOSIS — M6281 Muscle weakness (generalized): Secondary | ICD-10-CM

## 2024-05-06 DIAGNOSIS — R262 Difficulty in walking, not elsewhere classified: Secondary | ICD-10-CM

## 2024-05-06 NOTE — Therapy (Signed)
 " OUTPATIENT OCCUPATIONAL THERAPY NEURO RECERTIFICATION NOTE  Patient Name: Lisa Williams MRN: [id number] DOB:07-14-1960, 63 y.o., female Today's Date: 05/06/2024  PCP: Dr. Layman Williams   Neurologist Dr. Suanne at [facility] REFERRING PROVIDER: Dr. Layman Williams    END OF SESSION:  OT End of Session - 05/06/24 1102     Visit Number 66    Number of Visits 78    Date for Recertification  07/29/24    Progress Note Due on Visit 70    OT Start Time 1100    OT Stop Time 1145    OT Time Calculation (min) 45 min    Equipment Utilized During Treatment manual wc    Activity Tolerance Patient tolerated treatment well    Behavior During Therapy Lisa Williams for tasks assessed/performed         Past Medical History:  Diagnosis Date   Abdominal pain, right upper quadrant    Back pain    Calculus of kidney 12/09/2013   Chronic back pain    unspecified   Chronic left shoulder pain 07/19/2015   Complication of anesthesia    Functional disorder of bladder    other   Galactorrhea 11/26/2014   Chronic    Hereditary and idiopathic neuropathy 08/19/2013   High cholesterol    History of kidney stones    HPV test positive    Hypercholesteremia 08/19/2013   Hypertension    Hypertension    Hypothyroidism    Incomplete bladder emptying    Microscopic hematuria    MS (multiple sclerosis)    Muscle spasticity 05/21/2014   Nonspecific findings on examination of urine    other   Osteopenia    PONV (postoperative nausea and vomiting)    Prediabetes    Sickle cell trait    Status post laparoscopic supracervical hysterectomy 11/26/2014   Tobacco user 11/26/2014   Wrist fracture    Past Surgical History:  Procedure Laterality Date   bilateral tubal ligation  1996   BREAST CYST EXCISION Left 2002   CYST EXCISION Left 05/10/2022   Procedure: CYST REMOVAL;  Surgeon: Lisa Romano, MD;  Location: [facility] ORS;  Service: General;  Laterality: Left;   FRACTURE SURGERY     KNEE  SURGERY     right   LAPAROSCOPIC SUPRACERVICAL HYSTERECTOMY  08/05/2013   ORIF WRIST FRACTURE Left 01/17/2017   Procedure: OPEN REDUCTION INTERNAL FIXATION (ORIF) WRIST FRACTURE;  Surgeon: Lisa Lynwood SAUNDERS, MD;  Location: [facility] OR[facility];  Service: Orthopedics;  Laterality: Left;   RADIOLOGY WITH ANESTHESIA N/A 03/18/2020   Procedure: MRI WITH ANESTHESIA CERVICAL SPINE AND BRAIN  WITH AND WITHOUT CONTRAST;  Surgeon: Radiologist, Medication, MD;  Location: [facility];  Service: Radiology;  Laterality: N/A;   right tibia fracture Right    2024   TUBAL LIGATION Bilateral    VAGINAL HYSTERECTOMY  03/2006   VAGINAL HYSTERECTOMY     2014   Patient Active Problem List   Diagnosis Date Noted   Health care maintenance 04/21/2024   SOB (shortness of breath) on exertion 03/12/2024   Hyperglycemia 01/09/2024   Abnormal LFTs (liver function tests) 09/17/2023   Sinus tachycardia 07/07/2023   Aortic atherosclerosis 01/18/2023   Prediabetes 01/18/2023   Hypokalemia 07/29/2022   Weakness of both lower extremities 07/29/2022   Abscess of left groin 11/15/2021   Abnormal LFTs 11/15/2021   Acute respiratory disease due to COVID-19 virus 11/21/2020   Fatigue due to excessive exertion    Hypoalbuminemia due to protein-calorie malnutrition  Neurogenic bowel    Neurogenic bladder    Labile blood pressure    Neuropathic pain    Multiple sclerosis, relapsing-remitting 07/09/2019   Abscess of female pelvis    SVT (supraventricular tachycardia)    Radial styloid tenosynovitis 03/12/2018   Wheelchair dependence 02/27/2018   Localized osteoporosis with current pathological fracture with routine healing 01/19/2017   Wrist fracture 01/16/2017   Sprain of ankle 03/23/2016   Closed fracture of lateral malleolus 03/16/2016   Depression screening 01/24/2016   Blood pressure elevated without history of HTN 10/25/2015   Essential hypertension 10/25/2015   Multiple sclerosis 10/02/2015   Chronic left shoulder pain  07/19/2015   Multiple sclerosis exacerbation 07/14/2015   MS (multiple sclerosis) 11/26/2014   Increased body mass index 11/26/2014   HPV test positive 11/26/2014   Status post laparoscopic supracervical hysterectomy 11/26/2014   Galactorrhea 11/26/2014   Back ache 05/21/2014   Adiposity 05/21/2014   Disordered sleep 05/21/2014   Muscle spasticity 05/21/2014   Spasticity 05/21/2014   Calculus of kidney 12/09/2013   Renal colic 12/09/2013   Hypercholesteremia 08/19/2013   Hereditary and idiopathic neuropathy 08/19/2013   Hypercholesterolemia without hypertriglyceridemia 08/19/2013   Bladder infection, chronic 07/25/2012   Disorder of bladder function 07/25/2012   Incomplete bladder emptying 07/25/2012   Microscopic hematuria 07/25/2012   Right upper quadrant pain 07/25/2012   ONSET DATE: 5/24 Amon and broke R leg which caused beginning of ADL decline; diagnosed with MS in 1995)  REFERRING DIAG: MS  THERAPY DIAG:  Muscle weakness (generalized)  Other lack of coordination  Multiple sclerosis exacerbation  Rationale for Evaluation and Treatment: Rehabilitation  SUBJECTIVE:  SUBJECTIVE STATEMENT: Pt in agreement with plan to reduce OT visits to 1x per week in new cert period.  Pt accompanied by: self  PERTINENT HISTORY: Pt reports increased difficulty with basic self care tasks since breaking her R leg last May.  Since that time, mobility and ADLs have declined, with pt requiring assist from private caregivers and family to manage bathing, toileting, dressing, and functional transfers.  PRECAUTIONS: Fall  WEIGHT BEARING RESTRICTIONS: No  PAIN: 05/06/24: No pain today  Are you having pain? No; occasional pain in R lower back, but not today  FALLS: Has patient fallen in last 6 months? No Last fall was May 17 of 2024  LIVING ENVIRONMENT: Lives with: lives with their family (including mother who has dementia and sister Lisa Williams)  Lives in: 2 level home but pt resides on  main level Stairs: ramp  Has following equipment at home: Wheelchair (manual), Shower bench, and hand held shower shower, 1 grab bar in the shower, 3in1 commode, sit to stand lift (electric), FWW, Williams bed  PLOF: modified indep with ADL/IADLs prior to May of 7975   PATIENT GOALS: Increase independence with basic self care tasks  OBJECTIVE:  Note: Objective measures were completed at Evaluation unless otherwise noted.  HAND DOMINANCE: Right  ADLs:  Overall ADLs: assist provided from paid caregiver and sister Transfers/ambulation related to ADLs: sit to stand lift for all transfers, set up for sliding board in/out of bed  Eating: indep  Grooming: modified indep (wc level in mom's bathroom)  UB Dressing: set up (can't access her bedroom closet)  LB Dressing: able to sit on side of bed to don all LB clothing; assist to hike pants/underwear Toileting: assist with clothing management d/t standing within electric lift Bathing: bed bath with sister helping to wash backside Tub Shower transfers: N/A; pt has walk in shower  in mom's bathroom (wc does fit in this bathroom but pt reports inability to transfer from wc and requires arm rests on a bench for a successful transfer attempt from any DME).  Tub bench is in pt's bathroom, but lift does not fit through bathroom doorway and pt reports inability to transfer up from tub bench (currently unable to manage either transfer)   Equipment: see above  IADLs: Shopping: [employer] for shopping Light housekeeping: modified indep from wc level Meal Prep: modified indep from wc level Community mobility: relies on community transportation or family members Medication management: indep Landscape architect: indep Handwriting: NT; pt denies any FMC challenges  MOBILITY STATUS: Hx of falls  POSTURE COMMENTS:  Rounded shoulders, forward head, anterior pelvic tilt, and weight shift left   ACTIVITY TOLERANCE: Eval: Activity tolerance: Per PT;  currently standing 30-60 sec within Light Gait harness/lift; currently non-ambulatory  01/24/24: Standing at sink: 1 minute; min A and 5 attempts to achieve fully erect standing position with BUEs supported on sink countertop, OT assist to block R knee to prevent buckling   02/28/24: x4 attempts, achieves 75% upright standing with MIN A to block knees.   UPPER EXTREMITY ROM:  BUEs WFL  UPPER EXTREMITY MMT:     MMT Right eval Right 05/06/24 Left eval Left 05/06/24  Shoulder flexion 4+ 4+ 4 4  Shoulder abduction 4+ 4+ 4 4  Shoulder adduction      Shoulder extension      Shoulder internal rotation 4+ 4+ 4+ 4+  Shoulder external rotation 4 4 4 4   Middle trapezius      Lower trapezius      Elbow flexion 4+ 5 4+ 5  Elbow extension 4+ 5 4+ 5  Wrist flexion 4+ 4+ 4+ 4+  Wrist extension 4+ 5 4+ 4+  Wrist ulnar deviation      Wrist radial deviation      Wrist pronation      Wrist supination      (Blank rows = not tested)  HAND FUNCTION/COORDINATION:  R/L WNL; pt denies any coordination deficits/ able to manipulate pills/clothing fasteners/etc without difficulty  11/15/23: *Measures taken d/t PT note revealing pt with increased difficulty manipulating smaller ADL supplies during reaching activities in PT session   11/15/23: Grip strength: R: 70 lbs, L: 63 lbs Pinch strength: Lateral: R: 16 lbs, L: 15 lbs; 3 point pinch: R: 12 lbs, L: 13 lbs 9 hole peg test: Right: 25 sec, L 49 sec   12/25/23: Grip strength: Right: 70 lbs; Left: 65 lbs  9 hole peg test: Right: 24 sec; Left: 3 trials: 54 sec, 1 min, 56 sec  01/24/24: Grip grip: Right: 76 lbs; Left: 67 lbs Lateral pinch: R: 17 lbs, L: 17 lbs; 3 point pinch: R: 13 lbs, L: 14 lbs  9 Hole Peg Test: R: 26 sec, L: 50 sec  02/28/24: Grip: Right: 85 lbs; Left: 71 lbs Lateral pinch: R: 20 lbs, L: 19 lbs; 3 point pinch: R: 17 lbs, L: 18 lbs  9 Hole Peg Test: R: 29 sec, L: 40 sec   04/10/24: Grip: Right: 85 lbs; Left: 81 lbs;   Lateral pinch:  R: 18 lbs, L: 18 lbs; 3 point pinch: R: 17 lbs, L: 18 lbs   9 hole Peg Test: R: 25 sec, L 32 sec    05/06/24: Right: 85 lbs; Left: 78 lbs;  Lateral pinch: Right: 20 lbs, L: 17 lbs; 3 point pinch: R: 15 lbs, L: 16 lbs  SENSATION: WFL  EDEMA: No visible edema in BUEs  MUSCLE TONE: BUEs WNL  COGNITION: Overall cognitive status: Within functional limits for tasks assessed  VISION: wears glasses all the time, no reports of diplopia  PERCEPTION: Not tested  PRAXIS: WFL  OBSERVATIONS:  Pt pleasant, cooperative, and motivated to work towards improving indep with ADLs.                                                                                                     TREATMENT DATE: 04/29/24 Therapeutic Activity: -Objective measures taken and goals updated for recertification.  Self Care: -Review of progress towards OT goals, and participation in development of new goals and frequency to continue to maximize indep with daily tasks.   PATIENT EDUCATION: Education details: progress towards goals  Person educated: Patient Education method: Explanation and Verbal cues Education comprehension: verbalized understanding  HOME EXERCISE PROGRAM: IR A/AAROM to promote shoulder flexibility for peri care/bathing  GOALS: Goals reviewed with patient? Yes  SHORT TERM GOALS: Target date: 12/25/23  Pt will perform bed bath with set up only. Baseline: Eval: Min-mod A for posterior washing; 09/28/23: Min A for posterior washing; 11/13/23: Able to manage bed bath but sister helps with thoroughness, per pt's preference; pt in agreement to trial bath sitting EOB or in wc before next OT session. Goal status: achieved/d/c  2.  Pt will utilize sliding board for wc<>drop arm commode transfer with SBA. Baseline: Eval: Currently using electric lift for transfer to [facility]; 09/28/23: Not yet completed at home but transfers to commode have been easier without sliding board d/t pt implementing a scoot  pivot. Goal status: d/c (pt does not prefer sliding board for commode transfer)  3.  Pt will be indep to perform HEP for maintaining BUE strength for ADLs and functional transfers. Baseline: Eval: HEP not yet initiated; 09/28/23: Pt is working on core stability exercises from wc, cane stretches for bilat shoulder IR, and tricep extension via wc pushups; 11/13/23: indep Goal status: achieved  LONG TERM GOALS: Target date: 07/29/24  Pt will perform sink bath with set up A. (Revised on 11/13/23 from min A to set up) Baseline: Eval: Currently bed bathing d/t inability to stand at sink without electric lift (lift does not fit into pt's bathroom); 09/28/23: still completing bed bath as pt is not yet able to stand; 11/13/23: Not yet attempted; pt encouraged to attempt before next OT session now that bed bath goal has been met; 12/20/23: Pt is now completing sponge bath with min A while seated EOB (caregiver assists with washing peri area d/t no bed rail to aid in R lateral lean); 01/24/24: Performing with set up on EOB, completing lateral weight shifts to wash posterior/peri area with wc in front of pt and bed rail for LUE support and foot of bed for RUE support  Goal status: achieved  2.  Pt will perform wc<>tub bench transfer with min A. Baseline: Eval: Unable; 09/28/23: Performed in OT clinic with min A, but not yet in the home Goal status: d/c (pt prefers sink bath)  3.  Pt will perform squat pivot transfer wc<>BSC with SBA.  Baseline: Eval: Eval: Currently using electric lift for transfer to [facility]; 09/28/23: Not yet completed in clinic with [facility], and using lift for [facility] in the home still; 11/13/23: Still using Camie lift at home and in clinic transfers are all scoot rather than squat pivots d/t LE weakness; 12/20/23: Pt reports that she plans to use her Camie lift until she can ambulate to her [facility] d/t limited space for wc in her desired location for [facility] at home. Goal status: d/c  4. Pt will perform scoot transfer  wc<>toilet using grab bars with supv to allow toileting in community setting.  Baseline: Eval: Pt limits community outings d/t requiring use of lift for [facility] transfers; 09/28/23: Pt has performed 2 successful scoot pivot  Transfers to toilet in OT clinic with min A, extra time.  Further trials with clothing management on toilet needed, but pt can lower and hike  pants while sititng edge of mat via lateral leaning and min A to maintain core stability; 11/13/23: Pt performs with CGA and extra time; 12/19/23: Pt  performs with close supv on most attempts, occasional CGA; efficiency is improving; 01/24/24: ongoing improvements; able to complete in fewer scoots (3 scoots each way from wc<>toilet) d/t improve engagement of Les and improved anterior weight shift; occasional min guard-supv; pt completes with therapist in clinic, but not yet in other community settings with sister, and not yet voided on toilet in community. 02/28/24: has not trialed in community, completes with SBA; 04/10/24: deferred in recent visits to focus on standing for sink bath, per pt request; 05/06/24: Pt working on scoot transfers wc<>mat table, not yet attempting on toilet.  Pt is able to achieve some clearance of buttocks during transfer, but requires at least 5 lifts to complete transfer.   Goal status: ongoing  5.  Pt will complete seated clothing management with min A to enable voiding in handicapped stall within community. Baseline: Recert 11/13/23: Pt can transfer to toilet with increased time and effort, but has not yet attempted clothing management or voiding while seated on commode in community setting.  Pt can lower pants to knees while seated in wc, but requires mod A to hike over hips from seated position; 12/20/23: Pt fully lowers panties and shorts while seated on commode with supv, can hike clothing to her her upper thighs, transfers back to wc, and then performs a tricep extension from wc level to allow caregiver to hike clothing  remainder of the way for purposes of EC (min A for clothing management component) Goal status: achieved  6.  Pt will tolerate dynamic standing x2 min with CGA, alternating 1 hand on walker for support to allow pt to complete peri care and towel drying while standing at sink for bath. Baseline: Eval: Currently standing in electric lift only, or within parallel bars without lift; 09/28/23: Not yet attempted; 11/13/23: Pt is able to perform static standing with heavy BUE support on parallel bars with PT; 12/19/22: Not attempted recently d/t new intermittent clonus in BLEs; 01/24/24: Tolerated standing 1 min today at sink with min A for ascent, OT assist to block R knee from buckling. 02/28/24: tolerates up to 1 min standing, difficulty achieving stand 2/2 fatigue from PT; 04/10/24: Pt is inconsistent to achieve full standing position, but when able, can tolerate 1-2 min with CGA-min A and RW for support.  Pt was able to remove R hand briefly from walker (~3 sec or less) to  touch R buttocks to simulate reach for clothing management and peri care; 05/06/24: Pt predominantly requires BUE support on walker in standing, but has been able to remove 1 hand from walker for 3 sec or less with CGA-min A on a couple of attempts during therapy. Goal status: improved/revised   7.  Pt will increase bilat grip strength in order to securely grasp walker sufficiently for functional transfers and ambulation.  Baseline: Recert 11/13/23: TBD (PT note read following OT session, indicating pt with difficulty securing items in hand);  11/15/23: Grip strength: R: 70 lbs, L: 63 lbs; 12/25/23: R 76 lbs, L 67 lbs; sufficient for functional transfers/amb  Goal status: achieved  8.  Pt will increase L [facility] skills in order to manipulate small ADL supplies with reduced dropping as indicated by 20 sec or more improvement in 9 hole peg test. Baseline: Recert 11/13/23: 9 hole peg test TBD (PT note read following OT session, indicating pt with  difficulty manipulating and securing items in hands); 11/15/23: Right: 25 sec, L 49 sec; 12/25/23: Right: 24 sec; Left: 3 trials: 54 sec, 1 min, 56 sec; 01/24/24: R: 26 sec, L: 50 sec. 02/28/24: L: 40 sec; 04/10/24: R 25 sec, L 32 sec; 05/06/24: R: 25 sec; L: 41 sec (pt reports dropping silverware often)  Goal status: in progress  9.  Pt will increase core stability as demonstrated by ability to maintain midline sitting EOB without UE support while crossing legs to dress and lotion BLEs. Baseline: Recert: 05/06/24: Pt reports mild-moderate shifts from midline while crossing legs, with occasional need for 1 arm support on EOB to maintain sitting balance. Goal status: New  ASSESSMENT: CLINICAL IMPRESSION: Pt seen for OT recertification.  Goals updated above.  Pt continues to make steady gains with core stability, standing balance, and ADL performance.  Pt has maintained or improved BUE strength well throughout this cert period (see above).  Pt continues to present with decreased L hand FMC/dexterity skills, and endorses frequent dropping of silverware when washing and putting away dishes.  Will continue to address [facility] with goal stated above.  Planning to reduce OT frequency to 1x per week, as pt often feels fatigued with back to back PT and OT visits 2x per week, and pt would prefer not to come in for an OT session on a separate day from PT at this time.  Pt in agreement with goal and frequency revisions. Pt  will continue to benefit from skilled OT to work towards above noted goals in OT poc, working to maximize indep with daily tasks while reducing burden of care on caregivers.   PERFORMANCE DEFICITS: in functional skills including ADLs, IADLs, strength, pain, flexibility, Gross motor control, mobility, balance, body mechanics, endurance, and decreased knowledge of use of DME, and psychosocial skills including coping strategies, environmental adaptation, habits, and routines and behaviors.   IMPAIRMENTS:  are limiting patient from ADLs, IADLs, and social participation.   CO-MORBIDITIES: has co-morbidities such as neuropathy, back pain, obesity, MS, HTN that affects occupational performance. Patient will benefit from skilled OT to address above impairments and improve overall function.  MODIFICATION OR ASSISTANCE TO COMPLETE EVALUATION: No modification of tasks or assist necessary to complete an evaluation.  OT OCCUPATIONAL PROFILE AND HISTORY: Detailed assessment: Review of records and additional review of physical, cognitive, psychosocial history related to current functional performance.  CLINICAL DECISION MAKING: Moderate - several treatment options, min-mod task modification necessary  REHAB POTENTIAL: Good  EVALUATION COMPLEXITY: Moderate  PLAN:  OT  FREQUENCY: 1x/week  OT DURATION: 12 weeks  PLANNED INTERVENTIONS: 97168 OT Re-evaluation, 97535 self care/ADL training, 02889 therapeutic exercise, 97530 therapeutic activity, 97112 neuromuscular re-education, 97140 manual therapy, 97116 gait training, 02989 moist heat, 97010 cryotherapy, balance training, functional mobility training, psychosocial skills training, energy conservation, coping strategies training, patient/family education, and DME and/or AE instructions  RECOMMENDED OTHER SERVICES: None at this time (Pt currently receiving PT services in this clinic)  CONSULTED AND AGREED WITH PLAN OF CARE: Patient  PLAN FOR NEXT SESSION: see above  Inocente Blazing, MS, OTR/L  Inocente MARLA Blazing, OT 05/06/2024, 11:08 AM     "

## 2024-05-13 ENCOUNTER — Other Ambulatory Visit
Admission: RE | Admit: 2024-05-13 | Discharge: 2024-05-13 | Disposition: A | Discharge status: Home / Self Care | Attending: Internal Medicine | Admitting: Internal Medicine

## 2024-05-13 ENCOUNTER — Ambulatory Visit: Attending: Neurology

## 2024-05-13 ENCOUNTER — Ambulatory Visit: Payer: PPO

## 2024-05-13 DIAGNOSIS — M6281 Muscle weakness (generalized): Secondary | ICD-10-CM | POA: Insufficient documentation

## 2024-05-13 DIAGNOSIS — R2681 Unsteadiness on feet: Secondary | ICD-10-CM | POA: Insufficient documentation

## 2024-05-13 DIAGNOSIS — G35D Multiple sclerosis, unspecified: Secondary | ICD-10-CM | POA: Diagnosis present

## 2024-05-13 DIAGNOSIS — E7800 Pure hypercholesterolemia, unspecified: Secondary | ICD-10-CM | POA: Diagnosis present

## 2024-05-13 DIAGNOSIS — R278 Other lack of coordination: Secondary | ICD-10-CM | POA: Diagnosis present

## 2024-05-13 DIAGNOSIS — R269 Unspecified abnormalities of gait and mobility: Secondary | ICD-10-CM | POA: Insufficient documentation

## 2024-05-13 LAB — TSH: TSH: 3.77 u[IU]/mL (ref 0.350–4.500)

## 2024-05-13 NOTE — Therapy (Signed)
 " OUTPATIENT OCCUPATIONAL THERAPY NEURO TREATMENT NOTE  Patient Name: Lisa Williams MRN: [id number] DOB:1960-12-12, 64 y.o., female Today's Date: 05/13/2024  PCP: Dr. Layman Piety   Neurologist Dr. Suanne at [facility] REFERRING PROVIDER: Dr. Layman Piety    END OF SESSION:  OT End of Session - 05/13/24 1156     Visit Number 67    Number of Visits 78    Date for Recertification  07/29/24    Authorization Time Period Reporting period beginning 04/10/24    Progress Note Due on Visit 70    OT Start Time 1100    OT Stop Time 1140    OT Time Calculation (min) 40 min    Equipment Utilized During Treatment manual wc    Activity Tolerance Patient tolerated treatment well    Behavior During Therapy [facility] for tasks assessed/performed         Past Medical History:  Diagnosis Date   Abdominal pain, right upper quadrant    Back pain    Calculus of kidney 12/09/2013   Chronic back pain    unspecified   Chronic left shoulder pain 07/19/2015   Complication of anesthesia    Functional disorder of bladder    other   Galactorrhea 11/26/2014   Chronic    Hereditary and idiopathic neuropathy 08/19/2013   High cholesterol    History of kidney stones    HPV test positive    Hypercholesteremia 08/19/2013   Hypertension    Hypertension    Hypothyroidism    Incomplete bladder emptying    Microscopic hematuria    MS (multiple sclerosis)    Muscle spasticity 05/21/2014   Nonspecific findings on examination of urine    other   Osteopenia    PONV (postoperative nausea and vomiting)    Prediabetes    Sickle cell trait    Status post laparoscopic supracervical hysterectomy 11/26/2014   Tobacco user 11/26/2014   Wrist fracture    Past Surgical History:  Procedure Laterality Date   bilateral tubal ligation  1996   BREAST CYST EXCISION Left 2002   CYST EXCISION Left 05/10/2022   Procedure: CYST REMOVAL;  Surgeon: Rodolph Romano, MD;  Location: [facility] ORS;  Service:  General;  Laterality: Left;   FRACTURE SURGERY     KNEE SURGERY     right   LAPAROSCOPIC SUPRACERVICAL HYSTERECTOMY  08/05/2013   ORIF WRIST FRACTURE Left 01/17/2017   Procedure: OPEN REDUCTION INTERNAL FIXATION (ORIF) WRIST FRACTURE;  Surgeon: Leora Lynwood SAUNDERS, MD;  Location: [facility] ORS;  Service: Orthopedics;  Laterality: Left;   RADIOLOGY WITH ANESTHESIA N/A 03/18/2020   Procedure: MRI WITH ANESTHESIA CERVICAL SPINE AND BRAIN  WITH AND WITHOUT CONTRAST;  Surgeon: Radiologist, Medication, MD;  Location: [facility] OR;  Service: Radiology;  Laterality: N/A;   right tibia fracture Right    2024   TUBAL LIGATION Bilateral    VAGINAL HYSTERECTOMY  03/2006   VAGINAL HYSTERECTOMY     2014   Patient Active Problem List   Diagnosis Date Noted   Health care maintenance 04/21/2024   SOB (shortness of breath) on exertion 03/12/2024   Hyperglycemia 01/09/2024   Abnormal LFTs (liver function tests) 09/17/2023   Sinus tachycardia 07/07/2023   Aortic atherosclerosis 01/18/2023   Prediabetes 01/18/2023   Hypokalemia 07/29/2022   Weakness of both lower extremities 07/29/2022   Abscess of left groin 11/15/2021   Abnormal LFTs 11/15/2021   Acute respiratory disease due to COVID-19 virus 11/21/2020   Fatigue due to excessive exertion  Hypoalbuminemia due to protein-calorie malnutrition    Neurogenic bowel    Neurogenic bladder    Labile blood pressure    Neuropathic pain    Multiple sclerosis, relapsing-remitting 07/09/2019   Abscess of female pelvis    SVT (supraventricular tachycardia)    Radial styloid tenosynovitis 03/12/2018   Wheelchair dependence 02/27/2018   Localized osteoporosis with current pathological fracture with routine healing 01/19/2017   Wrist fracture 01/16/2017   Sprain of ankle 03/23/2016   Closed fracture of lateral malleolus 03/16/2016   Depression screening 01/24/2016   Blood pressure elevated without history of HTN 10/25/2015   Essential hypertension 10/25/2015    Multiple sclerosis 10/02/2015   Chronic left shoulder pain 07/19/2015   Multiple sclerosis exacerbation 07/14/2015   MS (multiple sclerosis) 11/26/2014   Increased body mass index 11/26/2014   HPV test positive 11/26/2014   Status post laparoscopic supracervical hysterectomy 11/26/2014   Galactorrhea 11/26/2014   Back ache 05/21/2014   Adiposity 05/21/2014   Disordered sleep 05/21/2014   Muscle spasticity 05/21/2014   Spasticity 05/21/2014   Calculus of kidney 12/09/2013   Renal colic 12/09/2013   Hypercholesteremia 08/19/2013   Hereditary and idiopathic neuropathy 08/19/2013   Hypercholesterolemia without hypertriglyceridemia 08/19/2013   Bladder infection, chronic 07/25/2012   Disorder of bladder function 07/25/2012   Incomplete bladder emptying 07/25/2012   Microscopic hematuria 07/25/2012   Right upper quadrant pain 07/25/2012   ONSET DATE: 5/24 Amon and broke R leg which caused beginning of ADL decline; diagnosed with MS in 1995)  REFERRING DIAG: MS  THERAPY DIAG:  Muscle weakness (generalized)  Other lack of coordination  Multiple sclerosis exacerbation  Rationale for Evaluation and Treatment: Rehabilitation  SUBJECTIVE:  SUBJECTIVE STATEMENT: Pt reports she had a nice holiday and relaxing weekend.   Pt accompanied by: self  PERTINENT HISTORY: Pt reports increased difficulty with basic self care tasks since breaking her R leg last May.  Since that time, mobility and ADLs have declined, with pt requiring assist from private caregivers and family to manage bathing, toileting, dressing, and functional transfers.  PRECAUTIONS: Fall  WEIGHT BEARING RESTRICTIONS: No  PAIN: 05/13/24: No pain today  Are you having pain? No; occasional pain in R lower back, but not today  FALLS: Has patient fallen in last 6 months? No Last fall was May 17 of 2024  LIVING ENVIRONMENT: Lives with: lives with their family (including mother who has dementia and sister [name])  Lives in:  2 level home but pt resides on main level Stairs: ramp  Has following equipment at home: Wheelchair (manual), Shower bench, and hand held shower shower, 1 grab bar in the shower, 3in1 commode, sit to stand lift (electric), FWW, hospital bed  PLOF: modified indep with ADL/IADLs prior to May of 7975   PATIENT GOALS: Increase independence with basic self care tasks  OBJECTIVE:  Note: Objective measures were completed at Evaluation unless otherwise noted.  HAND DOMINANCE: Right  ADLs:  Overall ADLs: assist provided from paid caregiver and sister Transfers/ambulation related to ADLs: sit to stand lift for all transfers, set up for sliding board in/out of bed  Eating: indep  Grooming: modified indep (wc level in mom's bathroom)  UB Dressing: set up (can't access her bedroom closet)  LB Dressing: able to sit on side of bed to don all LB clothing; assist to hike pants/underwear Toileting: assist with clothing management d/t standing within electric lift Bathing: bed bath with sister helping to wash backside Tub Shower transfers: N/A; pt has walk  in shower in mom's bathroom (wc does fit in this bathroom but pt reports inability to transfer from wc and requires arm rests on a bench for a successful transfer attempt from any DME).  Tub bench is in pt's bathroom, but lift does not fit through bathroom doorway and pt reports inability to transfer up from tub bench (currently unable to manage either transfer)   Equipment: see above  IADLs: Shopping: [employer] for shopping Light housekeeping: modified indep from wc level Meal Prep: modified indep from wc level Community mobility: relies on community transportation or family members Medication management: indep Landscape architect: indep Handwriting: NT; pt denies any [facility] challenges  MOBILITY STATUS: Hx of falls  POSTURE COMMENTS:  Rounded shoulders, forward head, anterior pelvic tilt, and weight shift left   ACTIVITY TOLERANCE: Eval:  Activity tolerance: Per PT; currently standing 30-60 sec within Light Gait harness/lift; currently non-ambulatory  01/24/24: Standing at sink: 1 minute; min A and 5 attempts to achieve fully erect standing position with BUEs supported on sink countertop, OT assist to block R knee to prevent buckling   02/28/24: x4 attempts, achieves 75% upright standing with MIN A to block knees.   UPPER EXTREMITY ROM:  BUEs WFL  UPPER EXTREMITY MMT:     MMT Right eval Right 05/06/24 Left eval Left 05/06/24  Shoulder flexion 4+ 4+ 4 4  Shoulder abduction 4+ 4+ 4 4  Shoulder adduction      Shoulder extension      Shoulder internal rotation 4+ 4+ 4+ 4+  Shoulder external rotation 4 4 4 4   Middle trapezius      Lower trapezius      Elbow flexion 4+ 5 4+ 5  Elbow extension 4+ 5 4+ 5  Wrist flexion 4+ 4+ 4+ 4+  Wrist extension 4+ 5 4+ 4+  Wrist ulnar deviation      Wrist radial deviation      Wrist pronation      Wrist supination      (Blank rows = not tested)  HAND FUNCTION/COORDINATION:  R/L WNL; pt denies any coordination deficits/ able to manipulate pills/clothing fasteners/etc without difficulty  11/15/23: *Measures taken d/t PT note revealing pt with increased difficulty manipulating smaller ADL supplies during reaching activities in PT session   11/15/23: Grip strength: R: 70 lbs, L: 63 lbs Pinch strength: Lateral: R: 16 lbs, L: 15 lbs; 3 point pinch: R: 12 lbs, L: 13 lbs 9 hole peg test: Right: 25 sec, L 49 sec   12/25/23: Grip strength: Right: 70 lbs; Left: 65 lbs  9 hole peg test: Right: 24 sec; Left: 3 trials: 54 sec, 1 min, 56 sec  01/24/24: Grip grip: Right: 76 lbs; Left: 67 lbs Lateral pinch: R: 17 lbs, L: 17 lbs; 3 point pinch: R: 13 lbs, L: 14 lbs  9 Hole Peg Test: R: 26 sec, L: 50 sec  02/28/24: Grip: Right: 85 lbs; Left: 71 lbs Lateral pinch: R: 20 lbs, L: 19 lbs; 3 point pinch: R: 17 lbs, L: 18 lbs  9 Hole Peg Test: R: 29 sec, L: 40 sec   04/10/24: Grip: Right: 85 lbs;  Left: 81 lbs;   Lateral pinch: R: 18 lbs, L: 18 lbs; 3 point pinch: R: 17 lbs, L: 18 lbs   9 hole Peg Test: R: 25 sec, L 32 sec    05/06/24: Right: 85 lbs; Left: 78 lbs;  Lateral pinch: Right: 20 lbs, L: 17 lbs; 3 point pinch: R: 15 lbs, L: 16  lbs   SENSATION: WFL  EDEMA: No visible edema in BUEs  MUSCLE TONE: BUEs WNL  COGNITION: Overall cognitive status: Within functional limits for tasks assessed  VISION: wears glasses all the time, no reports of diplopia  PERCEPTION: Not tested  PRAXIS: WFL  OBSERVATIONS:  Pt pleasant, cooperative, and motivated to work towards improving indep with ADLs.                                                                                                     TREATMENT DATE: 05/13/24 Therapeutic Activity: -STS trials in prep for standing pericare for sink bath.  Simulated peri care with dynamic standing and alternating R/LUEs releasing from walker to reach buttocks and between the legs from the front.  Pt required mod vc for anterior WS during ascent to standing and min A for dynamic standing stability.  Self Care: -R wc brake loose; checked and tightened R brake on the R side for optimal brake functionality.  Still seems to be slightly loose if bumped.  Recommended pt follow up with [facility] for repair.  Pt verbalized understanding.   PATIENT EDUCATION: Education details: Company Secretary Person educated: Patient Education method: Explanation, Demonstration, Tactile cues, and Verbal cues Education comprehension: verbalized understanding, returned demonstration, verbal cues required, tactile cues required, and needs further education  HOME EXERCISE PROGRAM: IR A/AAROM to promote shoulder flexibility for peri care/bathing  GOALS: Goals reviewed with patient? Yes  SHORT TERM GOALS: Target date: 12/25/23  Pt will perform bed bath with set up only. Baseline: Eval: Min-mod A for posterior washing; 09/28/23: Min A for posterior washing; 11/13/23: Able to  manage bed bath but sister helps with thoroughness, per pt's preference; pt in agreement to trial bath sitting EOB or in wc before next OT session. Goal status: achieved/d/c  2.  Pt will utilize sliding board for wc<>drop arm commode transfer with SBA. Baseline: Eval: Currently using electric lift for transfer to [facility]; 09/28/23: Not yet completed at home but transfers to commode have been easier without sliding board d/t pt implementing a scoot pivot. Goal status: d/c (pt does not prefer sliding board for commode transfer)  3.  Pt will be indep to perform HEP for maintaining BUE strength for ADLs and functional transfers. Baseline: Eval: HEP not yet initiated; 09/28/23: Pt is working on core stability exercises from wc, cane stretches for bilat shoulder IR, and tricep extension via wc pushups; 11/13/23: indep Goal status: achieved  LONG TERM GOALS: Target date: 07/29/24  Pt will perform sink bath with set up A. (Revised on 11/13/23 from min A to set up) Baseline: Eval: Currently bed bathing d/t inability to stand at sink without electric lift (lift does not fit into pt's bathroom); 09/28/23: still completing bed bath as pt is not yet able to stand; 11/13/23: Not yet attempted; pt encouraged to attempt before next OT session now that bed bath goal has been met; 12/20/23: Pt is now completing sponge bath with min A while seated EOB (caregiver assists with washing peri area d/t no bed rail to aid in R lateral lean); 01/24/24:  Performing with set up on EOB, completing lateral weight shifts to wash posterior/peri area with wc in front of pt and bed rail for LUE support and foot of bed for RUE support  Goal status: achieved  2.  Pt will perform wc<>tub bench transfer with min A. Baseline: Eval: Unable; 09/28/23: Performed in OT clinic with min A, but not yet in the home Goal status: d/c (pt prefers sink bath)  3.  Pt will perform squat pivot transfer wc<>BSC with SBA.  Baseline: Eval: Eval: Currently using  electric lift for transfer to [facility]; 09/28/23: Not yet completed in clinic with [facility], and using lift for [facility] in the home still; 11/13/23: Still using Camie lift at home and in clinic transfers are all scoot rather than squat pivots d/t LE weakness; 12/20/23: Pt reports that she plans to use her Camie lift until she can ambulate to her [facility] d/t limited space for wc in her desired location for [facility] at home. Goal status: d/c  4. Pt will perform scoot transfer wc<>toilet using grab bars with supv to allow toileting in community setting.  Baseline: Eval: Pt limits community outings d/t requiring use of lift for [facility] transfers; 09/28/23: Pt has performed 2 successful scoot pivot  Transfers to toilet in OT clinic with min A, extra time.  Further trials with clothing management on toilet needed, but pt can lower and hike  pants while sititng edge of mat via lateral leaning and min A to maintain core stability; 11/13/23: Pt performs with CGA and extra time; 12/19/23: Pt  performs with close supv on most attempts, occasional CGA; efficiency is improving; 01/24/24: ongoing improvements; able to complete in fewer scoots (3 scoots each way from wc<>toilet) d/t improve engagement of Les and improved anterior weight shift; occasional min guard-supv; pt completes with therapist in clinic, but not yet in other community settings with sister, and not yet voided on toilet in community. 02/28/24: has not trialed in community, completes with SBA; 04/10/24: deferred in recent visits to focus on standing for sink bath, per pt request; 05/06/24: Pt working on scoot transfers wc<>mat table, not yet attempting on toilet.  Pt is able to achieve some clearance of buttocks during transfer, but requires at least 5 lifts to complete transfer.   Goal status: ongoing  5.  Pt will complete seated clothing management with min A to enable voiding in handicapped stall within community. Baseline: Recert 11/13/23: Pt can transfer to toilet with increased time  and effort, but has not yet attempted clothing management or voiding while seated on commode in community setting.  Pt can lower pants to knees while seated in wc, but requires mod A to hike over hips from seated position; 12/20/23: Pt fully lowers panties and shorts while seated on commode with supv, can hike clothing to her her upper thighs, transfers back to wc, and then performs a tricep extension from wc level to allow caregiver to hike clothing remainder of the way for purposes of EC (min A for clothing management component) Goal status: achieved  6.  Pt will tolerate dynamic standing x2 min with CGA, alternating 1 hand on walker for support to allow pt to complete peri care and towel drying while standing at sink for bath. Baseline: Eval: Currently standing in electric lift only, or within parallel bars without lift; 09/28/23: Not yet attempted; 11/13/23: Pt is able to perform static standing with heavy BUE support on parallel bars with PT; 12/19/22: Not attempted recently d/t new intermittent clonus  in BLEs; 01/24/24: Tolerated standing 1 min today at sink with min A for ascent, OT assist to block R knee from buckling. 02/28/24: tolerates up to 1 min standing, difficulty achieving stand 2/2 fatigue from PT; 04/10/24: Pt is inconsistent to achieve full standing position, but when able, can tolerate 1-2 min with CGA-min A and RW for support.  Pt was able to remove R hand briefly from walker (~3 sec or less) to touch R buttocks to simulate reach for clothing management and peri care; 05/06/24: Pt predominantly requires BUE support on walker in standing, but has been able to remove 1 hand from walker for 3 sec or less with CGA-min A on a couple of attempts during therapy. Goal status: improved/revised   7.  Pt will increase bilat grip strength in order to securely grasp walker sufficiently for functional transfers and ambulation.  Baseline: Recert 11/13/23: TBD (PT note read following OT session, indicating pt  with difficulty securing items in hand);  11/15/23: Grip strength: R: 70 lbs, L: 63 lbs; 12/25/23: R 76 lbs, L 67 lbs; sufficient for functional transfers/amb  Goal status: achieved  8.  Pt will increase L [facility] skills in order to manipulate small ADL supplies with reduced dropping as indicated by 20 sec or more improvement in 9 hole peg test. Baseline: Recert 11/13/23: 9 hole peg test TBD (PT note read following OT session, indicating pt with difficulty manipulating and securing items in hands); 11/15/23: Right: 25 sec, L 49 sec; 12/25/23: Right: 24 sec; Left: 3 trials: 54 sec, 1 min, 56 sec; 01/24/24: R: 26 sec, L: 50 sec. 02/28/24: L: 40 sec; 04/10/24: R 25 sec, L 32 sec; 05/06/24: R: 25 sec; L: 41 sec (pt reports dropping silverware often)  Goal status: in progress  9.  Pt will increase core stability as demonstrated by ability to maintain midline sitting EOB without UE support while crossing legs to dress and lotion BLEs. Baseline: Recert: 05/06/24: Pt reports mild-moderate shifts from midline while crossing legs, with occasional need for 1 arm support on EOB to maintain sitting balance. Goal status: New  ASSESSMENT: CLINICAL IMPRESSION: With no PT session today, pt was able to perform full STS on first attempt, and stood > 3 min, including ability to alternate R/L hands for quick reaching trials to simulate standing peri care during a sink bath.  Pt verbalized feeling most confident to release the L hand for posterior and anterior reach between the legs.  Pt was able to briefly reach anteriorly with the RUE between the legs, but did not feel confident with posterior reach today with the R hand.  Pt reports she typically washes with the R hand, but does alternate at baseline.  Pt will continue to benefit from skilled OT to work towards above noted goals in OT poc, working to maximize indep with daily tasks while reducing burden of care on caregivers.   PERFORMANCE DEFICITS: in functional skills  including ADLs, IADLs, strength, pain, flexibility, Gross motor control, mobility, balance, body mechanics, endurance, and decreased knowledge of use of DME, and psychosocial skills including coping strategies, environmental adaptation, habits, and routines and behaviors.   IMPAIRMENTS: are limiting patient from ADLs, IADLs, and social participation.   CO-MORBIDITIES: has co-morbidities such as neuropathy, back pain, obesity, MS, HTN that affects occupational performance. Patient will benefit from skilled OT to address above impairments and improve overall function.  MODIFICATION OR ASSISTANCE TO COMPLETE EVALUATION: No modification of tasks or assist necessary to complete an evaluation.  OT OCCUPATIONAL PROFILE AND HISTORY: Detailed assessment: Review of records and additional review of physical, cognitive, psychosocial history related to current functional performance.  CLINICAL DECISION MAKING: Moderate - several treatment options, min-mod task modification necessary  REHAB POTENTIAL: Good  EVALUATION COMPLEXITY: Moderate  PLAN:  OT FREQUENCY: 1x/week  OT DURATION: 12 weeks  PLANNED INTERVENTIONS: 97168 OT Re-evaluation, 97535 self care/ADL training, 02889 therapeutic exercise, 97530 therapeutic activity, 97112 neuromuscular re-education, 97140 manual therapy, 97116 gait training, 02989 moist heat, 97010 cryotherapy, balance training, functional mobility training, psychosocial skills training, energy conservation, coping strategies training, patient/family education, and DME and/or AE instructions  RECOMMENDED OTHER SERVICES: None at this time (Pt currently receiving PT services in this clinic)  CONSULTED AND AGREED WITH PLAN OF CARE: Patient  PLAN FOR NEXT SESSION: see above  Inocente Blazing, MS, OTR/L  Inocente MARLA Blazing, OT 05/13/2024, 11:58 AM     "

## 2024-05-14 ENCOUNTER — Ambulatory Visit: Payer: Self-pay | Admitting: Internal Medicine

## 2024-05-14 NOTE — Therapy (Signed)
 "       OUTPATIENT PHYSICAL THERAPY NEURO TREATMENT     Patient Name: Lisa Williams MRN: [id number] DOB:05/05/1961, 64 y.o., female Today's Date: 05/15/2024  PCP: Lenon Na  REFERRING PROVIDER: Lenon Na   END OF SESSION:  PT End of Session - 05/15/24 1015     Visit Number 81    Number of Visits 94    Date for Recertification  06/12/24   corrected   Progress Note Due on Visit 50    PT Start Time 1015    PT Stop Time 1058    PT Time Calculation (min) 43 min    Equipment Utilized During Treatment Gait belt    Activity Tolerance Patient tolerated treatment well;No increased pain    Behavior During Therapy [facility] for tasks assessed/performed              Past Medical History:  Diagnosis Date   Abdominal pain, right upper quadrant    Back pain    Calculus of kidney 12/09/2013   Chronic back pain    unspecified   Chronic left shoulder pain 07/19/2015   Complication of anesthesia    Functional disorder of bladder    other   Galactorrhea 11/26/2014   Chronic    Hereditary and idiopathic neuropathy 08/19/2013   High cholesterol    History of kidney stones    HPV test positive    Hypercholesteremia 08/19/2013   Hypertension    Hypertension    Hypothyroidism    Incomplete bladder emptying    Microscopic hematuria    MS (multiple sclerosis)    Muscle spasticity 05/21/2014   Nonspecific findings on examination of urine    other   Osteopenia    PONV (postoperative nausea and vomiting)    Prediabetes    Sickle cell trait    Status post laparoscopic supracervical hysterectomy 11/26/2014   Tobacco user 11/26/2014   Wrist fracture    Past Surgical History:  Procedure Laterality Date   bilateral tubal ligation  1996   BREAST CYST EXCISION Left 2002   CYST EXCISION Left 05/10/2022   Procedure: CYST REMOVAL;  Surgeon: Rodolph Romano, MD;  Location: [facility] ORS;  Service: General;  Laterality: Left;   FRACTURE SURGERY     KNEE SURGERY      right   LAPAROSCOPIC SUPRACERVICAL HYSTERECTOMY  08/05/2013   ORIF WRIST FRACTURE Left 01/17/2017   Procedure: OPEN REDUCTION INTERNAL FIXATION (ORIF) WRIST FRACTURE;  Surgeon: Leora Lynwood SAUNDERS, MD;  Location: [facility] OR[facility];  Service: Orthopedics;  Laterality: Left;   RADIOLOGY WITH ANESTHESIA N/A 03/18/2020   Procedure: MRI WITH ANESTHESIA CERVICAL SPINE AND BRAIN  WITH AND WITHOUT CONTRAST;  Surgeon: Radiologist, Medication, MD;  Location: [facility] OR;  Service: Radiology;  Laterality: N/A;   right tibia fracture Right    2024   TUBAL LIGATION Bilateral    VAGINAL HYSTERECTOMY  03/2006   VAGINAL HYSTERECTOMY     2014   Patient Active Problem List   Diagnosis Date Noted   Health care maintenance 04/21/2024   SOB (shortness of breath) on exertion 03/12/2024   Hyperglycemia 01/09/2024   Abnormal LFTs (liver function tests) 09/17/2023   Sinus tachycardia 07/07/2023   Aortic atherosclerosis 01/18/2023   Prediabetes 01/18/2023   Hypokalemia 07/29/2022   Weakness of both lower extremities 07/29/2022   Abscess of left groin 11/15/2021   Abnormal LFTs 11/15/2021   Acute respiratory disease due to COVID-19 virus 11/21/2020   Fatigue due to excessive exertion  Hypoalbuminemia due to protein-calorie malnutrition    Neurogenic bowel    Neurogenic bladder    Labile blood pressure    Neuropathic pain    Multiple sclerosis, relapsing-remitting 07/09/2019   Abscess of female pelvis    SVT (supraventricular tachycardia)    Radial styloid tenosynovitis 03/12/2018   Wheelchair dependence 02/27/2018   Localized osteoporosis with current pathological fracture with routine healing 01/19/2017   Wrist fracture 01/16/2017   Sprain of ankle 03/23/2016   Closed fracture of lateral malleolus 03/16/2016   Depression screening 01/24/2016   Blood pressure elevated without history of HTN 10/25/2015   Essential hypertension 10/25/2015   Multiple sclerosis 10/02/2015   Chronic left shoulder pain 07/19/2015    Multiple sclerosis exacerbation 07/14/2015   MS (multiple sclerosis) 11/26/2014   Increased body mass index 11/26/2014   HPV test positive 11/26/2014   Status post laparoscopic supracervical hysterectomy 11/26/2014   Galactorrhea 11/26/2014   Back ache 05/21/2014   Adiposity 05/21/2014   Disordered sleep 05/21/2014   Muscle spasticity 05/21/2014   Spasticity 05/21/2014   Calculus of kidney 12/09/2013   Renal colic 12/09/2013   Hypercholesteremia 08/19/2013   Hereditary and idiopathic neuropathy 08/19/2013   Hypercholesterolemia without hypertriglyceridemia 08/19/2013   Bladder infection, chronic 07/25/2012   Disorder of bladder function 07/25/2012   Incomplete bladder emptying 07/25/2012   Microscopic hematuria 07/25/2012   Right upper quadrant pain 07/25/2012    ONSET DATE: 1995  REFERRING DIAG: MS  THERAPY DIAG:  Muscle weakness (generalized)  Multiple sclerosis exacerbation  Unsteadiness on feet  Abnormality of gait and mobility  Rationale for Evaluation and Treatment: Rehabilitation  SUBJECTIVE:                                                                                                                                                                                             SUBJECTIVE STATEMENT: Patient reports she is going to be busy due to a death in the family.   Pt accompanied by: self  PERTINENT HISTORY:  Patient is returning to PT s/p hospitalization.  s/p ORIF of R tibia shaft fracture 09/23/2022. Patient has weakness in BLE with RLE>LLE. She drives with hand controls. Patient has been diagnosed with MS in 1995. PMH includes: back pain, CBP, chronic L shoulder pain, galactorrhea, neuropathy, HPV, hypercholestermeia, HTN, MS, osteopenia, PONV, wrist fracture. Additional order for other closed fracture of proximal end of R tibia with routine healing. Still has to use [employer].   PAIN:  Are you having pain? Occasional pain in RLE; primarily in  knee  PRECAUTIONS: Fall  RED FLAGS: None   WEIGHT BEARING RESTRICTIONS: No  FALLS: Has patient fallen in last 6 months? No  LIVING ENVIRONMENT: Lives with: lives with their family Lives in: House/apartment Stairs: ramp Has following equipment at home: Vannie - 2 wheeled, Wheelchair (manual), shower chair, and Grab bars  PLOF: Independent with household mobility with device  PATIENT GOALS: to get her independence back. To be able to get into car, toilet, and get dressed independently  OBJECTIVE:  Note: Objective measures were completed at Evaluation unless otherwise noted.  DIAGNOSTIC FINDINGS: MRI of the brain 03/18/2020 showed multiple T2/FLAIR hyperintense foci in the periventricular, juxtacortical and deep white matter.  There were no infratentorial lesions noted.  None of the foci enhanced.   Due to severe claustrophobia, the study was done with conscious sedation in the hospital   COGNITION: Overall cognitive status: Within functional limits for tasks assessed   SENSATION: Lack of sensation in feet, loss in bilateral lateral aspect of knee  COORDINATION: Does not have the strength for functional LE heel slide test   MUSCLE TONE: BLE mild tone    POSTURE: rounded shoulders, forward head, anterior pelvic tilt, and weight shift left   LOWER EXTREMITY MMT:    MMT Right Eval Left Eval  Hip flexion 0.9 1.5  Hip extension    Hip abduction 1.8 2.6  Hip adduction 3.1 1.9  Hip internal rotation    Hip external rotation    Knee flexion 0.9 1.5  Knee extension 0.8 1.2  Ankle dorsiflexion    Ankle plantarflexion    Ankle inversion    Ankle eversion    (Blank rows = not tested)  BED MOBILITY:  Assess in future session due to limited time  TRANSFERS: Assistive device utilized: Bariatric RW with sit to stands, slide board to table  Sit to stand: unable to reach full stand Stand to sit: unable to reach full stand  Chair to chair: slide board with CGA    GAIT: Unable to ambulate at this time.   FUNCTIONAL TESTS:  Sit to stand: tricep press with BUE; unable to bring arm to walker.    Function In Sitting Test (FIST)  (1/2 femur on surface; hips/knees flexed to 90deg)   - indicate bed or mat table / step stool if used  SCORING KEY: 4 = Independent (completes task independently & successfully) 3 = Verbal Cues/Increased Time (completes task independently & successfully and only needs more time/cues) 2 = Upper Extremity Support (must use UE for support or assistance to complete successfully) 1 = Needs Assistance (unable to complete w/o physical assist; DOCUMENT LEVEL: min, mod, max) 0 = Dependent (requires complete physical assist; unable to complete successfully even w/ physical assist)  Randomly Administer Once Throughout Exam  4 - Anterior Nudge (superior sternum)  4 - Posterior Nudge (between scapular spines)  4 - Lateral Nudge (to dominant side at acromion)     4 - Static sitting (30 seconds)  4 - Sitting, shake no (left and right)  4 - Sitting, eyes closed (30 seconds)   0 - Sitting, lift foot (dominant side, lift foot 1 inch twice)    2 - Pick up object from behind (object at midline, hands breadth posterior)  3 - Forward reach (use dominant arm, must complete full motion) 2 - Lateral reach (use dominant arm, clear opposite ischial tuberosity) 2 - Pick up object from floor (from between feet)   2 - Posterior scooting (move backwards 2 inches)  2 - Anterior scooting (move forward 2 inches)  2 - Lateral scooting (move  to dominant side 2 inches)    TOTAL = 39/56  Notes/comments: slide board tranfer to/from table   MCD > 5 points MCID for IP REHAB > 6 points  Function In Sitting Test (FIST) 08/14/23 (1/2 femur on surface; hips/knees flexed to 90deg)   - indicate bed or mat table / step stool if used  SCORING KEY: 4 = Independent (completes task independently & successfully) 3 = Verbal Cues/Increased Time (completes  task independently & successfully and only needs more time/cues) 2 = Upper Extremity Support (must use UE for support or assistance to complete successfully) 1 = Needs Assistance (unable to complete w/o physical assist; DOCUMENT LEVEL: min, mod, max) 0 = Dependent (requires complete physical assist; unable to complete successfully even w/ physical assist)  Randomly Administer Once Throughout Exam  4 - Anterior Nudge (superior sternum)  4 - Posterior Nudge (between scapular spines)  4 - Lateral Nudge (to dominant side at acromion)     4 - Static sitting (30 seconds)  4 - Sitting, shake no (left and right)  4 - Sitting, eyes closed (30 seconds)   0 - Sitting, lift foot (dominant side, lift foot 1 inch twice)    4 - Pick up object from behind (object at midline, hands breadth posterior)  4 - Forward reach (use dominant arm, must complete full motion) 4 - Lateral reach (use dominant arm, clear opposite ischial tuberosity)  - Pick up object from floor (from between feet)   3 - Posterior scooting (move backwards 2 inches)  3 - Anterior scooting (move forward 2 inches)  3 - Lateral scooting (move to dominant side 2 inches)    TOTAL = 47/56  MCD > 5 points MCID for IP REHAB > 6 points   TREATMENT DATE: 05/15/2024  Rest breaks given and pt's symptoms monitored throughout the session.  Manual: R thoracic and quadratus lumborum soft tissue manipulation with implementation of effleurage and pettrisage x 14 minutes    TherEx: To improve strength, endurance, mobility, and function of specific targeted muscle groups or improve joint range of motion or improve muscle flexibility  slider: hamstring curl 15x each LE Adduction isometric 15x 5 second holds Abduction isometric 15x 5 second holds  TherAct: Pt attempt at standing x 3 trials. Pt noted her LE's just didn't feel correct today and elected to pursue different tasks during session.  Pt utilized the bariatric FWW, pillow under the  thighs for easier ability to come upright, and sock applied to the L foot to reduce friction of the foot in mobility.    PATIENT EDUCATION:  Education details: Pt educated throughout session about proper posture and technique with exercises. Improved exercise technique, movement at target joints, use of target muscles after min to mod verbal, visual, tactile cues.  Person educated: Patient Education method: Explanation, Demonstration, Tactile cues, and Verbal cues Education comprehension: verbalized understanding, returned demonstration, verbal cues required, tactile cues required, and needs further education    HOME EXERCISE PROGRAM: Stand 3x/day in stander   GOALS: Goals reviewed with patient? Yes  SHORT TERM GOALS: Target date: 08/08/2023  Patient will be independent in home exercise program to improve strength/mobility for better functional independence with ADLs.  Baseline: 4/8: compliance Goal status: MET    LONG TERM GOALS: Target date: 06/12/24  Patient will tolerate five consecutive stands with UE support from wheelchair to standing to improve functional mobility with additional cushion and RW.  Baseline: unable to perform 4/8:unable to perform full stand 5/20: perform next  session 5/29: two full stands with CGA and cushion on her seat 7/31: able to stand 3 times from [facility]. With no addition cushion  in parallel bars. 12/27/2023= Will retest next session- concentrated on just standing on past 2 visits 03/20/24:  Goal status: Ongoing   2.  Patient will ambulate 10 ft with bRW with wheelchair follow.  Baseline:  unable to ambulate 4/8: unable to ambulate 5/20: able to ambulate length of // bars in previous session 5/27: ambulate 2.5 lengths of // bars with two seated rest breaks with min A for weight shift and wheelchair follow  7/31: able to ambulate in parallel bars 3 ft with min assist and +2 follow in WC> difficulty advancing the LLE on this day. 12/27/2023- Patient unable to  take a step forward today but was abel to take a step on previous visit- limited by clonus like trembling that has progressed over past month. 03/20/24: 26' at one time Goal status: MET   3.  Patient will improve FIST score >6 points to demonstrate improved stability and ability to perform ADLs.  Baseline:  3/6: 39/56 4/8: 47/ 56 5/20: 48/56 7/31:46/56 Goal status: MET  4.  Patient will perform toileting with mod I at home for improved independence.  Baseline: 3/6: requires assistance 4/8: requires dependence on machine 5/20: requires assistance with use of wipe buddy.  7/31: continues to require use of sara steady and wipe buddy for safety and hygiene. 12/27/2023- Patient reports that since she is unable to take steps that she continues to require use of sara steady and wipe buddy for safe personal hygiene. 03/20/24: Pt utilizing sara stedy to perform safe personal hygiene, however is no longer utilizing the wipe buddy. 12/30: using sara stedy for toileting, able to clean independently  Goal status: PROGRESSING   5. Patient will perform > 5 min of static or dynamic standing with BUE and Min/mod assist  for improved LE strength and pre-gait function to assist with toileting and overall standing ability for improve functional independence.  Baseline: 12/27/2023-Patient was able to stand for just over 3 min today. 01/10/24: Pt able to stand 1 min 38 sec 02/14/24: Pt able to >3 minutes at this time. 03/20/24: Pt able to achieve >4 minutes at this time. Goal status: PROGRESSING  6.  Patient will ambulate 40 ft with BRW with wheelchair follow, in one attempt, without rest break. Baseline: 14' during one attempt with sock 12/30: 21.2 ft with BrW wheelchair follow and no sock Goal status: Partially Met   ASSESSMENT:  CLINICAL IMPRESSION:  Patient is challenged with sit to stands this session. Upon standing she has significant trembling of LLE that results in buckling of limb. Due to this she was  unable to perform ambulation. Pt will continue to benefit from skilled therapy to address remaining deficits in order to improve overall QoL and return to PLOF.        OBJECTIVE IMPAIRMENTS: Abnormal gait, cardiopulmonary status limiting activity, decreased activity tolerance, decreased balance, decreased coordination, decreased endurance, decreased mobility, difficulty walking, decreased ROM, decreased strength, hypomobility, increased fascial restrictions, impaired perceived functional ability, impaired flexibility, impaired sensation, improper body mechanics, and postural dysfunction.   ACTIVITY LIMITATIONS: carrying, lifting, bending, sitting, standing, squatting, sleeping, stairs, transfers, bed mobility, continence, bathing, toileting, dressing, self feeding, reach over head, hygiene/grooming, locomotion level, and caring for others  PARTICIPATION LIMITATIONS: meal prep, cleaning, laundry, interpersonal relationship, driving, shopping, community activity, and church  PERSONAL FACTORS: Age, Past/current experiences, Time since onset of injury/illness/exacerbation,  Transportation, and 3+ comorbidities: ack pain, CBP, chronic L shoulder pain, galactorrhea, neuropathy, HPV, hypercholestermeia, HTN, MS, osteopenia, PONV, wrist fracture are also affecting patient's functional outcome.   REHAB POTENTIAL: Good  CLINICAL DECISION MAKING: Evolving/moderate complexity  EVALUATION COMPLEXITY: Moderate  PLAN:  PT FREQUENCY: 2x/week  PT DURATION: 12 weeks  PLANNED INTERVENTIONS: 97164- PT Re-evaluation, 97110-Therapeutic exercises, 97530- Therapeutic activity, 97112- Neuromuscular re-education, 97535- Self Care, [id number]- Manual therapy, [phone]- Gait training, [phone]- Orthotic Fit/training, [phone]- Canalith repositioning, [id number]- Aquatic Therapy, [phone]- Electrical stimulation (unattended), [phone]- Electrical stimulation (manual), [id number]- Ultrasound, [id number]- Traction (mechanical), Patient/Family education,  Balance training, Stair training, Taping, Dry Needling, Joint mobilization, Joint manipulation, Spinal manipulation, Spinal mobilization, Scar mobilization, Compression bandaging, Vestibular training, Visual/preceptual remediation/compensation, Cognitive remediation, DME instructions, Cryotherapy, Moist heat, and Biofeedback  PLAN FOR NEXT SESSION:  Strap stand frame with more dynamic activities Sit to stands; continue supported walking, continue dynamic core stabilization training and seated reaching tasks to improve weight shifting.  Continue with ambulation in the hallway with the bariatric walker, sock, and use of cushion for easier lift off.   Shandon Matson  Leopoldo, PT, DPT Physical Therapist - [facility]  Outpatient Physical Therapy- Main [facility] [phone]    05/15/2024, 12:13 PM  "

## 2024-05-15 ENCOUNTER — Encounter

## 2024-05-15 ENCOUNTER — Ambulatory Visit: Payer: PPO

## 2024-05-15 DIAGNOSIS — R2681 Unsteadiness on feet: Secondary | ICD-10-CM

## 2024-05-15 DIAGNOSIS — M6281 Muscle weakness (generalized): Secondary | ICD-10-CM

## 2024-05-15 DIAGNOSIS — R269 Unspecified abnormalities of gait and mobility: Secondary | ICD-10-CM

## 2024-05-15 DIAGNOSIS — G35D Multiple sclerosis, unspecified: Secondary | ICD-10-CM

## 2024-05-19 ENCOUNTER — Ambulatory Visit
Admission: RE | Admit: 2024-05-19 | Discharge: 2024-05-19 | Disposition: A | Discharge status: Home / Self Care | Source: Ambulatory Visit | Attending: Internal Medicine | Admitting: Internal Medicine

## 2024-05-19 ENCOUNTER — Ambulatory Visit
Admission: RE | Admit: 2024-05-19 | Discharge: 2024-05-19 | Disposition: A | Source: Ambulatory Visit | Attending: Internal Medicine | Admitting: Internal Medicine

## 2024-05-19 DIAGNOSIS — Z1231 Encounter for screening mammogram for malignant neoplasm of breast: Secondary | ICD-10-CM | POA: Insufficient documentation

## 2024-05-19 DIAGNOSIS — Z780 Asymptomatic menopausal state: Secondary | ICD-10-CM | POA: Diagnosis present

## 2024-05-20 ENCOUNTER — Ambulatory Visit: Payer: PPO

## 2024-05-20 ENCOUNTER — Ambulatory Visit

## 2024-05-20 ENCOUNTER — Ambulatory Visit: Payer: Self-pay | Admitting: Internal Medicine

## 2024-05-20 DIAGNOSIS — M6281 Muscle weakness (generalized): Secondary | ICD-10-CM | POA: Diagnosis not present

## 2024-05-20 DIAGNOSIS — R278 Other lack of coordination: Secondary | ICD-10-CM

## 2024-05-20 DIAGNOSIS — G35D Multiple sclerosis, unspecified: Secondary | ICD-10-CM

## 2024-05-20 NOTE — Therapy (Unsigned)
 " OUTPATIENT OCCUPATIONAL THERAPY NEURO TREATMENT NOTE  Patient Name: Lisa Williams MRN: [id number] DOB:Jan 29, 1961, 64 y.o., female Today's Date: 05/23/2024  PCP: Dr. Layman Piety   Neurologist Dr. Suanne at [facility] REFERRING PROVIDER: Dr. Layman Piety    END OF SESSION:  OT End of Session - 05/23/24 1950     Visit Number 68    Number of Visits 78    Date for Recertification  07/29/24    Authorization Time Period Reporting period beginning 04/10/24    Progress Note Due on Visit 70    OT Start Time 1100    OT Stop Time 1140    OT Time Calculation (min) 40 min    Equipment Utilized During Treatment manual wc    Activity Tolerance Patient tolerated treatment well    Behavior During Therapy [facility] for tasks assessed/performed         Past Medical History:  Diagnosis Date   Abdominal pain, right upper quadrant    Back pain    Calculus of kidney 12/09/2013   Chronic back pain    unspecified   Chronic left shoulder pain 07/19/2015   Complication of anesthesia    Functional disorder of bladder    other   Galactorrhea 11/26/2014   Chronic    Hereditary and idiopathic neuropathy 08/19/2013   High cholesterol    History of kidney stones    HPV test positive    Hypercholesteremia 08/19/2013   Hypertension    Hypertension    Hypothyroidism    Incomplete bladder emptying    Microscopic hematuria    MS (multiple sclerosis)    Muscle spasticity 05/21/2014   Nonspecific findings on examination of urine    other   Osteopenia    PONV (postoperative nausea and vomiting)    Prediabetes    Sickle cell trait    Status post laparoscopic supracervical hysterectomy 11/26/2014   Tobacco user 11/26/2014   Wrist fracture    Past Surgical History:  Procedure Laterality Date   bilateral tubal ligation  1996   BREAST CYST EXCISION Left 2002   CYST EXCISION Left 05/10/2022   Procedure: CYST REMOVAL;  Surgeon: Rodolph Romano, MD;  Location: [facility] ORS;  Service:  General;  Laterality: Left;   FRACTURE SURGERY     KNEE SURGERY     right   LAPAROSCOPIC SUPRACERVICAL HYSTERECTOMY  08/05/2013   ORIF WRIST FRACTURE Left 01/17/2017   Procedure: OPEN REDUCTION INTERNAL FIXATION (ORIF) WRIST FRACTURE;  Surgeon: Leora Lynwood SAUNDERS, MD;  Location: [facility] ORS;  Service: Orthopedics;  Laterality: Left;   RADIOLOGY WITH ANESTHESIA N/A 03/18/2020   Procedure: MRI WITH ANESTHESIA CERVICAL SPINE AND BRAIN  WITH AND WITHOUT CONTRAST;  Surgeon: Radiologist, Medication, MD;  Location: [facility] OR;  Service: Radiology;  Laterality: N/A;   right tibia fracture Right    2024   TUBAL LIGATION Bilateral    VAGINAL HYSTERECTOMY  03/2006   VAGINAL HYSTERECTOMY     2014   Patient Active Problem List   Diagnosis Date Noted   Health care maintenance 04/21/2024   SOB (shortness of breath) on exertion 03/12/2024   Hyperglycemia 01/09/2024   Abnormal LFTs (liver function tests) 09/17/2023   Sinus tachycardia 07/07/2023   Aortic atherosclerosis 01/18/2023   Prediabetes 01/18/2023   Hypokalemia 07/29/2022   Weakness of both lower extremities 07/29/2022   Abscess of left groin 11/15/2021   Abnormal LFTs 11/15/2021   Acute respiratory disease due to COVID-19 virus 11/21/2020   Fatigue due to excessive exertion  Hypoalbuminemia due to protein-calorie malnutrition    Neurogenic bowel    Neurogenic bladder    Labile blood pressure    Neuropathic pain    Multiple sclerosis, relapsing-remitting 07/09/2019   Abscess of female pelvis    SVT (supraventricular tachycardia)    Radial styloid tenosynovitis 03/12/2018   Wheelchair dependence 02/27/2018   Localized osteoporosis with current pathological fracture with routine healing 01/19/2017   Wrist fracture 01/16/2017   Sprain of ankle 03/23/2016   Closed fracture of lateral malleolus 03/16/2016   Depression screening 01/24/2016   Blood pressure elevated without history of HTN 10/25/2015   Essential hypertension 10/25/2015    Multiple sclerosis 10/02/2015   Chronic left shoulder pain 07/19/2015   Multiple sclerosis exacerbation 07/14/2015   MS (multiple sclerosis) 11/26/2014   Increased body mass index 11/26/2014   HPV test positive 11/26/2014   Status post laparoscopic supracervical hysterectomy 11/26/2014   Galactorrhea 11/26/2014   Back ache 05/21/2014   Adiposity 05/21/2014   Disordered sleep 05/21/2014   Muscle spasticity 05/21/2014   Spasticity 05/21/2014   Calculus of kidney 12/09/2013   Renal colic 12/09/2013   Hypercholesteremia 08/19/2013   Hereditary and idiopathic neuropathy 08/19/2013   Hypercholesterolemia without hypertriglyceridemia 08/19/2013   Bladder infection, chronic 07/25/2012   Disorder of bladder function 07/25/2012   Incomplete bladder emptying 07/25/2012   Microscopic hematuria 07/25/2012   Right upper quadrant pain 07/25/2012   ONSET DATE: 5/24 Amon and broke R leg which caused beginning of ADL decline; diagnosed with MS in 1995)  REFERRING DIAG: MS  THERAPY DIAG:  Muscle weakness (generalized)  Other lack of coordination  Multiple sclerosis exacerbation  Rationale for Evaluation and Treatment: Rehabilitation  SUBJECTIVE:  SUBJECTIVE STATEMENT: Pt reports no PT today as her therapist was ill.   Pt accompanied by: self  PERTINENT HISTORY: Pt reports increased difficulty with basic self care tasks since breaking her R leg last May.  Since that time, mobility and ADLs have declined, with pt requiring assist from private caregivers and family to manage bathing, toileting, dressing, and functional transfers.  PRECAUTIONS: Fall  WEIGHT BEARING RESTRICTIONS: No  PAIN: 05/20/24: No pain today  Are you having pain? No; occasional pain in R lower back, but not today  FALLS: Has patient fallen in last 6 months? No Last fall was May 17 of 2024  LIVING ENVIRONMENT: Lives with: lives with their family (including mother who has dementia and sister [name])  Lives in: 2  level home but pt resides on main level Stairs: ramp  Has following equipment at home: Wheelchair (manual), Shower bench, and hand held shower shower, 1 grab bar in the shower, 3in1 commode, sit to stand lift (electric), FWW, hospital bed  PLOF: modified indep with ADL/IADLs prior to May of 7975   PATIENT GOALS: Increase independence with basic self care tasks  OBJECTIVE:  Note: Objective measures were completed at Evaluation unless otherwise noted.  HAND DOMINANCE: Right  ADLs:  Overall ADLs: assist provided from paid caregiver and sister Transfers/ambulation related to ADLs: sit to stand lift for all transfers, set up for sliding board in/out of bed  Eating: indep  Grooming: modified indep (wc level in mom's bathroom)  UB Dressing: set up (can't access her bedroom closet)  LB Dressing: able to sit on side of bed to don all LB clothing; assist to hike pants/underwear Toileting: assist with clothing management d/t standing within electric lift Bathing: bed bath with sister helping to wash backside Tub Shower transfers: N/A; pt has walk  in shower in mom's bathroom (wc does fit in this bathroom but pt reports inability to transfer from wc and requires arm rests on a bench for a successful transfer attempt from any DME).  Tub bench is in pt's bathroom, but lift does not fit through bathroom doorway and pt reports inability to transfer up from tub bench (currently unable to manage either transfer)   Equipment: see above  IADLs: Shopping: [employer] for shopping Light housekeeping: modified indep from wc level Meal Prep: modified indep from wc level Community mobility: relies on community transportation or family members Medication management: indep Landscape architect: indep Handwriting: NT; pt denies any FMC challenges  MOBILITY STATUS: Hx of falls  POSTURE COMMENTS:  Rounded shoulders, forward head, anterior pelvic tilt, and weight shift left   ACTIVITY TOLERANCE: Eval:  Activity tolerance: Per PT; currently standing 30-60 sec within Light Gait harness/lift; currently non-ambulatory  01/24/24: Standing at sink: 1 minute; min A and 5 attempts to achieve fully erect standing position with BUEs supported on sink countertop, OT assist to block R knee to prevent buckling   02/28/24: x4 attempts, achieves 75% upright standing with MIN A to block knees.   UPPER EXTREMITY ROM:  BUEs WFL  UPPER EXTREMITY MMT:     MMT Right eval Right 05/06/24 Left eval Left 05/06/24  Shoulder flexion 4+ 4+ 4 4  Shoulder abduction 4+ 4+ 4 4  Shoulder adduction      Shoulder extension      Shoulder internal rotation 4+ 4+ 4+ 4+  Shoulder external rotation 4 4 4 4   Middle trapezius      Lower trapezius      Elbow flexion 4+ 5 4+ 5  Elbow extension 4+ 5 4+ 5  Wrist flexion 4+ 4+ 4+ 4+  Wrist extension 4+ 5 4+ 4+  Wrist ulnar deviation      Wrist radial deviation      Wrist pronation      Wrist supination      (Blank rows = not tested)  HAND FUNCTION/COORDINATION:  R/L WNL; pt denies any coordination deficits/ able to manipulate pills/clothing fasteners/etc without difficulty  11/15/23: *Measures taken d/t PT note revealing pt with increased difficulty manipulating smaller ADL supplies during reaching activities in PT session   11/15/23: Grip strength: R: 70 lbs, L: 63 lbs Pinch strength: Lateral: R: 16 lbs, L: 15 lbs; 3 point pinch: R: 12 lbs, L: 13 lbs 9 hole peg test: Right: 25 sec, L 49 sec   12/25/23: Grip strength: Right: 70 lbs; Left: 65 lbs  9 hole peg test: Right: 24 sec; Left: 3 trials: 54 sec, 1 min, 56 sec  01/24/24: Grip grip: Right: 76 lbs; Left: 67 lbs Lateral pinch: R: 17 lbs, L: 17 lbs; 3 point pinch: R: 13 lbs, L: 14 lbs  9 Hole Peg Test: R: 26 sec, L: 50 sec  02/28/24: Grip: Right: 85 lbs; Left: 71 lbs Lateral pinch: R: 20 lbs, L: 19 lbs; 3 point pinch: R: 17 lbs, L: 18 lbs  9 Hole Peg Test: R: 29 sec, L: 40 sec   04/10/24: Grip: Right: 85 lbs;  Left: 81 lbs;   Lateral pinch: R: 18 lbs, L: 18 lbs; 3 point pinch: R: 17 lbs, L: 18 lbs   9 hole Peg Test: R: 25 sec, L 32 sec    05/06/24: Right: 85 lbs; Left: 78 lbs;  Lateral pinch: Right: 20 lbs, L: 17 lbs; 3 point pinch: R: 15 lbs, L: 16  lbs   SENSATION: WFL  EDEMA: No visible edema in BUEs  MUSCLE TONE: BUEs WNL  COGNITION: Overall cognitive status: Within functional limits for tasks assessed  VISION: wears glasses all the time, no reports of diplopia  PERCEPTION: Not tested  PRAXIS: WFL  OBSERVATIONS:  Pt pleasant, cooperative, and motivated to work towards improving indep with ADLs.                                                                                                     TREATMENT DATE: 05/20/24 Therapeutic Activity: -STS trials in prep for standing pericare for sink bath.  Simulated peri care with dynamic standing and alternating R/LUEs releasing from walker to reach buttocks and between the legs from the front holding dry washcloth.  Pt required min vc for anterior WS during ascent to standing and min A for dynamic standing stability. -Facilitated B FMC and dexterity skills, working to pick up glass stones from dish, store in palm, and use translatory movements moving [place] stones between palm and fingertips in prep for discarding stones 1 at a time into dish.    PATIENT EDUCATION: Education details: Company Secretary Person educated: Patient Education method: Explanation, Actor cues, and Verbal cues Education comprehension: verbalized understanding, returned demonstration, verbal cues required, tactile cues required, and needs further education  HOME EXERCISE PROGRAM: IR A/AAROM to promote shoulder flexibility for peri care/bathing  GOALS: Goals reviewed with patient? Yes  SHORT TERM GOALS: Target date: 12/25/23  Pt will perform bed bath with set up only. Baseline: Eval: Min-mod A for posterior washing; 09/28/23: Min A for posterior washing; 11/13/23:  Able to manage bed bath but sister helps with thoroughness, per pt's preference; pt in agreement to trial bath sitting EOB or in wc before next OT session. Goal status: achieved/d/c  2.  Pt will utilize sliding board for wc<>drop arm commode transfer with SBA. Baseline: Eval: Currently using electric lift for transfer to [facility]; 09/28/23: Not yet completed at home but transfers to commode have been easier without sliding board d/t pt implementing a scoot pivot. Goal status: d/c (pt does not prefer sliding board for commode transfer)  3.  Pt will be indep to perform HEP for maintaining BUE strength for ADLs and functional transfers. Baseline: Eval: HEP not yet initiated; 09/28/23: Pt is working on core stability exercises from wc, cane stretches for bilat shoulder IR, and tricep extension via wc pushups; 11/13/23: indep Goal status: achieved  LONG TERM GOALS: Target date: 07/29/24  Pt will perform sink bath with set up A. (Revised on 11/13/23 from min A to set up) Baseline: Eval: Currently bed bathing d/t inability to stand at sink without electric lift (lift does not fit into pt's bathroom); 09/28/23: still completing bed bath as pt is not yet able to stand; 11/13/23: Not yet attempted; pt encouraged to attempt before next OT session now that bed bath goal has been met; 12/20/23: Pt is now completing sponge bath with min A while seated EOB (caregiver assists with washing peri area d/t no bed rail to aid in R lateral lean); 01/24/24:  Performing with set up on EOB, completing lateral weight shifts to wash posterior/peri area with wc in front of pt and bed rail for LUE support and foot of bed for RUE support  Goal status: achieved  2.  Pt will perform wc<>tub bench transfer with min A. Baseline: Eval: Unable; 09/28/23: Performed in OT clinic with min A, but not yet in the home Goal status: d/c (pt prefers sink bath)  3.  Pt will perform squat pivot transfer wc<>BSC with SBA.  Baseline: Eval: Eval: Currently using  electric lift for transfer to [facility]; 09/28/23: Not yet completed in clinic with [facility], and using lift for [facility] in the home still; 11/13/23: Still using Camie lift at home and in clinic transfers are all scoot rather than squat pivots d/t LE weakness; 12/20/23: Pt reports that she plans to use her Camie lift until she can ambulate to her [facility] d/t limited space for wc in her desired location for [facility] at home. Goal status: d/c  4. Pt will perform scoot transfer wc<>toilet using grab bars with supv to allow toileting in community setting.  Baseline: Eval: Pt limits community outings d/t requiring use of lift for [facility] transfers; 09/28/23: Pt has performed 2 successful scoot pivot  Transfers to toilet in OT clinic with min A, extra time.  Further trials with clothing management on toilet needed, but pt can lower and hike  pants while sititng edge of mat via lateral leaning and min A to maintain core stability; 11/13/23: Pt performs with CGA and extra time; 12/19/23: Pt  performs with close supv on most attempts, occasional CGA; efficiency is improving; 01/24/24: ongoing improvements; able to complete in fewer scoots (3 scoots each way from wc<>toilet) d/t improve engagement of Les and improved anterior weight shift; occasional min guard-supv; pt completes with therapist in clinic, but not yet in other community settings with sister, and not yet voided on toilet in community. 02/28/24: has not trialed in community, completes with SBA; 04/10/24: deferred in recent visits to focus on standing for sink bath, per pt request; 05/06/24: Pt working on scoot transfers wc<>mat table, not yet attempting on toilet.  Pt is able to achieve some clearance of buttocks during transfer, but requires at least 5 lifts to complete transfer.   Goal status: ongoing  5.  Pt will complete seated clothing management with min A to enable voiding in handicapped stall within community. Baseline: Recert 11/13/23: Pt can transfer to toilet with increased time  and effort, but has not yet attempted clothing management or voiding while seated on commode in community setting.  Pt can lower pants to knees while seated in wc, but requires mod A to hike over hips from seated position; 12/20/23: Pt fully lowers panties and shorts while seated on commode with supv, can hike clothing to her her upper thighs, transfers back to wc, and then performs a tricep extension from wc level to allow caregiver to hike clothing remainder of the way for purposes of EC (min A for clothing management component) Goal status: achieved  6.  Pt will tolerate dynamic standing x2 min with CGA, alternating 1 hand on walker for support to allow pt to complete peri care and towel drying while standing at sink for bath. Baseline: Eval: Currently standing in electric lift only, or within parallel bars without lift; 09/28/23: Not yet attempted; 11/13/23: Pt is able to perform static standing with heavy BUE support on parallel bars with PT; 12/19/22: Not attempted recently d/t new intermittent clonus  in BLEs; 01/24/24: Tolerated standing 1 min today at sink with min A for ascent, OT assist to block R knee from buckling. 02/28/24: tolerates up to 1 min standing, difficulty achieving stand 2/2 fatigue from PT; 04/10/24: Pt is inconsistent to achieve full standing position, but when able, can tolerate 1-2 min with CGA-min A and RW for support.  Pt was able to remove R hand briefly from walker (~3 sec or less) to touch R buttocks to simulate reach for clothing management and peri care; 05/06/24: Pt predominantly requires BUE support on walker in standing, but has been able to remove 1 hand from walker for 3 sec or less with CGA-min A on a couple of attempts during therapy. Goal status: improved/revised   7.  Pt will increase bilat grip strength in order to securely grasp walker sufficiently for functional transfers and ambulation.  Baseline: Recert 11/13/23: TBD (PT note read following OT session, indicating pt  with difficulty securing items in hand);  11/15/23: Grip strength: R: 70 lbs, L: 63 lbs; 12/25/23: R 76 lbs, L 67 lbs; sufficient for functional transfers/amb  Goal status: achieved  8.  Pt will increase [facility] skills in order to manipulate small ADL supplies with reduced dropping as indicated by 20 sec or more improvement in 9 hole peg test. Baseline: Recert 11/13/23: 9 hole peg test TBD (PT note read following OT session, indicating pt with difficulty manipulating and securing items in hands); 11/15/23: Right: 25 sec, L 49 sec; 12/25/23: Right: 24 sec; Left: 3 trials: 54 sec, 1 min, 56 sec; 01/24/24: R: 26 sec, L: 50 sec. 02/28/24: L: 40 sec; 04/10/24: R 25 sec, L 32 sec; 05/06/24: R: 25 sec; L: 41 sec (pt reports dropping silverware often)  Goal status: in progress  9.  Pt will increase core stability as demonstrated by ability to maintain midline sitting EOB without UE support while crossing legs to dress and lotion BLEs. Baseline: Recert: 05/06/24: Pt reports mild-moderate shifts from midline while crossing legs, with occasional need for 1 arm support on EOB to maintain sitting balance. Goal status: New  ASSESSMENT: CLINICAL IMPRESSION: With no PT session today, pt was able to perform 2 full STSs today, including ability to alternate R/L hands for quick reaching trials to simulate standing peri care during a sink bath holding a dry washcloth.  Pt demonstrated improved posterior reach with the R hand today, and more easily able to reach anteriorly between legs with either hand.  Pt dropped washcloth 1x, but was able to pick up in sitting.  Pt continues to be challenged with small item storage and translation of small items from palm to tip, with more difficulty in the L hand as demonstrated by more dropped stones with the L hand vs the R.  Pt will continue to benefit from skilled OT to work towards above noted goals in OT poc, working to maximize indep with daily tasks while reducing burden of care on  caregivers.   PERFORMANCE DEFICITS: in functional skills including ADLs, IADLs, strength, pain, flexibility, Gross motor control, mobility, balance, body mechanics, endurance, and decreased knowledge of use of DME, and psychosocial skills including coping strategies, environmental adaptation, habits, and routines and behaviors.   IMPAIRMENTS: are limiting patient from ADLs, IADLs, and social participation.   CO-MORBIDITIES: has co-morbidities such as neuropathy, back pain, obesity, MS, HTN that affects occupational performance. Patient will benefit from skilled OT to address above impairments and improve overall function.  MODIFICATION OR ASSISTANCE TO COMPLETE EVALUATION:  No modification of tasks or assist necessary to complete an evaluation.  OT OCCUPATIONAL PROFILE AND HISTORY: Detailed assessment: Review of records and additional review of physical, cognitive, psychosocial history related to current functional performance.  CLINICAL DECISION MAKING: Moderate - several treatment options, min-mod task modification necessary  REHAB POTENTIAL: Good  EVALUATION COMPLEXITY: Moderate  PLAN:  OT FREQUENCY: 1x/week  OT DURATION: 12 weeks  PLANNED INTERVENTIONS: 97168 OT Re-evaluation, 97535 self care/ADL training, 02889 therapeutic exercise, 97530 therapeutic activity, 97112 neuromuscular re-education, 97140 manual therapy, 97116 gait training, 02989 moist heat, 97010 cryotherapy, balance training, functional mobility training, psychosocial skills training, energy conservation, coping strategies training, patient/family education, and DME and/or AE instructions  RECOMMENDED OTHER SERVICES: None at this time (Pt currently receiving PT services in this clinic)  CONSULTED AND AGREED WITH PLAN OF CARE: Patient  PLAN FOR NEXT SESSION: see above  Inocente Blazing, MS, OTR/L  Inocente MARLA Blazing, OT 05/23/2024, 7:52 PM     "

## 2024-05-22 ENCOUNTER — Ambulatory Visit: Payer: PPO

## 2024-05-22 ENCOUNTER — Encounter

## 2024-05-26 NOTE — Therapy (Signed)
 "       OUTPATIENT PHYSICAL THERAPY NEURO TREATMENT     Patient Name: Lisa Williams MRN: [id number] DOB:08/08/1960, 64 y.o., female Today's Date: 05/27/2024  PCP: Lenon Na  REFERRING PROVIDER: Lenon Na   END OF SESSION:  PT End of Session - 05/27/24 1014     Visit Number 82    Number of Visits 94    Date for Recertification  06/12/24   corrected   Progress Note Due on Visit 50    PT Start Time 1015    PT Stop Time 1057    PT Time Calculation (min) 42 min    Equipment Utilized During Treatment Gait belt    Activity Tolerance Patient tolerated treatment well;No increased pain    Behavior During Therapy [facility] for tasks assessed/performed               Past Medical History:  Diagnosis Date   Abdominal pain, right upper quadrant    Back pain    Calculus of kidney 12/09/2013   Chronic back pain    unspecified   Chronic left shoulder pain 07/19/2015   Complication of anesthesia    Functional disorder of bladder    other   Galactorrhea 11/26/2014   Chronic    Hereditary and idiopathic neuropathy 08/19/2013   High cholesterol    History of kidney stones    HPV test positive    Hypercholesteremia 08/19/2013   Hypertension    Hypertension    Hypothyroidism    Incomplete bladder emptying    Microscopic hematuria    MS (multiple sclerosis)    Muscle spasticity 05/21/2014   Nonspecific findings on examination of urine    other   Osteopenia    PONV (postoperative nausea and vomiting)    Prediabetes    Sickle cell trait    Status post laparoscopic supracervical hysterectomy 11/26/2014   Tobacco user 11/26/2014   Wrist fracture    Past Surgical History:  Procedure Laterality Date   bilateral tubal ligation  1996   BREAST CYST EXCISION Left 2002   CYST EXCISION Left 05/10/2022   Procedure: CYST REMOVAL;  Surgeon: Rodolph Romano, MD;  Location: [facility] ORS;  Service: General;  Laterality: Left;   FRACTURE SURGERY     KNEE  SURGERY     right   LAPAROSCOPIC SUPRACERVICAL HYSTERECTOMY  08/05/2013   ORIF WRIST FRACTURE Left 01/17/2017   Procedure: OPEN REDUCTION INTERNAL FIXATION (ORIF) WRIST FRACTURE;  Surgeon: Leora Lynwood SAUNDERS, MD;  Location: [facility] ORS;  Service: Orthopedics;  Laterality: Left;   RADIOLOGY WITH ANESTHESIA N/A 03/18/2020   Procedure: MRI WITH ANESTHESIA CERVICAL SPINE AND BRAIN  WITH AND WITHOUT CONTRAST;  Surgeon: Radiologist, Medication, MD;  Location: [facility] OR;  Service: Radiology;  Laterality: N/A;   right tibia fracture Right    2024   TUBAL LIGATION Bilateral    VAGINAL HYSTERECTOMY  03/2006   VAGINAL HYSTERECTOMY     2014   Patient Active Problem List   Diagnosis Date Noted   Health care maintenance 04/21/2024   SOB (shortness of breath) on exertion 03/12/2024   Hyperglycemia 01/09/2024   Abnormal LFTs (liver function tests) 09/17/2023   Sinus tachycardia 07/07/2023   Aortic atherosclerosis 01/18/2023   Prediabetes 01/18/2023   Hypokalemia 07/29/2022   Weakness of both lower extremities 07/29/2022   Abscess of left groin 11/15/2021   Abnormal LFTs 11/15/2021   Acute respiratory disease due to COVID-19 virus 11/21/2020   Fatigue due to excessive exertion  Hypoalbuminemia due to protein-calorie malnutrition    Neurogenic bowel    Neurogenic bladder    Labile blood pressure    Neuropathic pain    Multiple sclerosis, relapsing-remitting 07/09/2019   Abscess of female pelvis    SVT (supraventricular tachycardia)    Radial styloid tenosynovitis 03/12/2018   Wheelchair dependence 02/27/2018   Localized osteoporosis with current pathological fracture with routine healing 01/19/2017   Wrist fracture 01/16/2017   Sprain of ankle 03/23/2016   Closed fracture of lateral malleolus 03/16/2016   Depression screening 01/24/2016   Blood pressure elevated without history of HTN 10/25/2015   Essential hypertension 10/25/2015   Multiple sclerosis 10/02/2015   Chronic left shoulder pain  07/19/2015   Multiple sclerosis exacerbation 07/14/2015   MS (multiple sclerosis) 11/26/2014   Increased body mass index 11/26/2014   HPV test positive 11/26/2014   Status post laparoscopic supracervical hysterectomy 11/26/2014   Galactorrhea 11/26/2014   Back ache 05/21/2014   Adiposity 05/21/2014   Disordered sleep 05/21/2014   Muscle spasticity 05/21/2014   Spasticity 05/21/2014   Calculus of kidney 12/09/2013   Renal colic 12/09/2013   Hypercholesteremia 08/19/2013   Hereditary and idiopathic neuropathy 08/19/2013   Hypercholesterolemia without hypertriglyceridemia 08/19/2013   Bladder infection, chronic 07/25/2012   Disorder of bladder function 07/25/2012   Incomplete bladder emptying 07/25/2012   Microscopic hematuria 07/25/2012   Right upper quadrant pain 07/25/2012    ONSET DATE: 1995  REFERRING DIAG: MS  THERAPY DIAG:  Muscle weakness (generalized)  Multiple sclerosis exacerbation  Unsteadiness on feet  Rationale for Evaluation and Treatment: Rehabilitation  SUBJECTIVE:                                                                                                                                                                                             SUBJECTIVE STATEMENT: Patient has OT today after her PT session.   Pt accompanied by: self  PERTINENT HISTORY:  Patient is returning to PT s/p hospitalization.  s/p ORIF of R tibia shaft fracture 09/23/2022. Patient has weakness in BLE with RLE>LLE. She drives with hand controls. Patient has been diagnosed with MS in 1995. PMH includes: back pain, CBP, chronic L shoulder pain, galactorrhea, neuropathy, HPV, hypercholestermeia, HTN, MS, osteopenia, PONV, wrist fracture. Additional order for other closed fracture of proximal end of R tibia with routine healing. Still has to use [employer].   PAIN:  Are you having pain? Occasional pain in RLE; primarily in knee  PRECAUTIONS: Fall  RED FLAGS: None   WEIGHT  BEARING RESTRICTIONS: No  FALLS: Has patient fallen in last 6 months? No  LIVING ENVIRONMENT:  Lives with: lives with their family Lives in: House/apartment Stairs: ramp Has following equipment at home: Vannie - 2 wheeled, Wheelchair (manual), shower chair, and Grab bars  PLOF: Independent with household mobility with device  PATIENT GOALS: to get her independence back. To be able to get into car, toilet, and get dressed independently  OBJECTIVE:  Note: Objective measures were completed at Evaluation unless otherwise noted.  DIAGNOSTIC FINDINGS: MRI of the brain 03/18/2020 showed multiple T2/FLAIR hyperintense foci in the periventricular, juxtacortical and deep white matter.  There were no infratentorial lesions noted.  None of the foci enhanced.   Due to severe claustrophobia, the study was done with conscious sedation in the hospital   COGNITION: Overall cognitive status: Within functional limits for tasks assessed   SENSATION: Lack of sensation in feet, loss in bilateral lateral aspect of knee  COORDINATION: Does not have the strength for functional LE heel slide test   MUSCLE TONE: BLE mild tone    POSTURE: rounded shoulders, forward head, anterior pelvic tilt, and weight shift left   LOWER EXTREMITY MMT:    MMT Right Eval Left Eval  Hip flexion 0.9 1.5  Hip extension    Hip abduction 1.8 2.6  Hip adduction 3.1 1.9  Hip internal rotation    Hip external rotation    Knee flexion 0.9 1.5  Knee extension 0.8 1.2  Ankle dorsiflexion    Ankle plantarflexion    Ankle inversion    Ankle eversion    (Blank rows = not tested)  BED MOBILITY:  Assess in future session due to limited time  TRANSFERS: Assistive device utilized: Bariatric RW with sit to stands, slide board to table  Sit to stand: unable to reach full stand Stand to sit: unable to reach full stand  Chair to chair: slide board with CGA   GAIT: Unable to ambulate at this time.   FUNCTIONAL TESTS:   Sit to stand: tricep press with BUE; unable to bring arm to walker.    Function In Sitting Test (FIST)  (1/2 femur on surface; hips/knees flexed to 90deg)   - indicate bed or mat table / step stool if used  SCORING KEY: 4 = Independent (completes task independently & successfully) 3 = Verbal Cues/Increased Time (completes task independently & successfully and only needs more time/cues) 2 = Upper Extremity Support (must use UE for support or assistance to complete successfully) 1 = Needs Assistance (unable to complete w/o physical assist; DOCUMENT LEVEL: min, mod, max) 0 = Dependent (requires complete physical assist; unable to complete successfully even w/ physical assist)  Randomly Administer Once Throughout Exam  4 - Anterior Nudge (superior sternum)  4 - Posterior Nudge (between scapular spines)  4 - Lateral Nudge (to dominant side at acromion)     4 - Static sitting (30 seconds)  4 - Sitting, shake no (left and right)  4 - Sitting, eyes closed (30 seconds)   0 - Sitting, lift foot (dominant side, lift foot 1 inch twice)    2 - Pick up object from behind (object at midline, hands breadth posterior)  3 - Forward reach (use dominant arm, must complete full motion) 2 - Lateral reach (use dominant arm, clear opposite ischial tuberosity) 2 - Pick up object from floor (from between feet)   2 - Posterior scooting (move backwards 2 inches)  2 - Anterior scooting (move forward 2 inches)  2 - Lateral scooting (move to dominant side 2 inches)    TOTAL = 39/56  Notes/comments: slide board tranfer to/from table   MCD > 5 points MCID for IP REHAB > 6 points  Function In Sitting Test (FIST) 08/14/23 (1/2 femur on surface; hips/knees flexed to 90deg)   - indicate bed or mat table / step stool if used  SCORING KEY: 4 = Independent (completes task independently & successfully) 3 = Verbal Cues/Increased Time (completes task independently & successfully and only needs more  time/cues) 2 = Upper Extremity Support (must use UE for support or assistance to complete successfully) 1 = Needs Assistance (unable to complete w/o physical assist; DOCUMENT LEVEL: min, mod, max) 0 = Dependent (requires complete physical assist; unable to complete successfully even w/ physical assist)  Randomly Administer Once Throughout Exam  4 - Anterior Nudge (superior sternum)  4 - Posterior Nudge (between scapular spines)  4 - Lateral Nudge (to dominant side at acromion)     4 - Static sitting (30 seconds)  4 - Sitting, shake no (left and right)  4 - Sitting, eyes closed (30 seconds)   0 - Sitting, lift foot (dominant side, lift foot 1 inch twice)    4 - Pick up object from behind (object at midline, hands breadth posterior)  4 - Forward reach (use dominant arm, must complete full motion) 4 - Lateral reach (use dominant arm, clear opposite ischial tuberosity)  - Pick up object from floor (from between feet)   3 - Posterior scooting (move backwards 2 inches)  3 - Anterior scooting (move forward 2 inches)  3 - Lateral scooting (move to dominant side 2 inches)    TOTAL = 47/56  MCD > 5 points MCID for IP REHAB > 6 points   TREATMENT DATE: 05/27/24  Rest breaks given and pt's symptoms monitored throughout the session.  TherEx: To improve strength, endurance, mobility, and function of specific targeted muscle groups or improve joint range of motion or improve muscle flexibility  slider: hamstring curl 15x each LE Dynadisc: pf 20x each LE, df 20x each LE, hamstring isometric 15x each LE  Adduction isometric 15x 5 second holds Abduction isometric 15x 5 second holds Glute squeeze 15x   TherAct: Pt attempt at standing x 3 trials. Took 3 steps on third attempt with BrW and CGA with wheelchair follow. Pt noted her LE's just didn't feel correct today and elected to pursue different tasks during session.  Pt utilized the bariatric FWW, pillow under the thighs for easier ability  to come upright, and sock applied to the L foot to reduce friction of the foot in mobility.    PATIENT EDUCATION:  Education details: Pt educated throughout session about proper posture and technique with exercises. Improved exercise technique, movement at target joints, use of target muscles after min to mod verbal, visual, tactile cues.  Person educated: Patient Education method: Explanation, Demonstration, Tactile cues, and Verbal cues Education comprehension: verbalized understanding, returned demonstration, verbal cues required, tactile cues required, and needs further education    HOME EXERCISE PROGRAM: Stand 3x/day in stander   GOALS: Goals reviewed with patient? Yes  SHORT TERM GOALS: Target date: 08/08/2023  Patient will be independent in home exercise program to improve strength/mobility for better functional independence with ADLs.  Baseline: 4/8: compliance Goal status: MET    LONG TERM GOALS: Target date: 06/12/24  Patient will tolerate five consecutive stands with UE support from wheelchair to standing to improve functional mobility with additional cushion and RW.  Baseline: unable to perform 4/8:unable to perform full stand 5/20: perform next session  5/29: two full stands with CGA and cushion on her seat 7/31: able to stand 3 times from [facility]. With no addition cushion  in parallel bars. 12/27/2023= Will retest next session- concentrated on just standing on past 2 visits 03/20/24:  Goal status: Ongoing   2.  Patient will ambulate 10 ft with bRW with wheelchair follow.  Baseline:  unable to ambulate 4/8: unable to ambulate 5/20: able to ambulate length of // bars in previous session 5/27: ambulate 2.5 lengths of // bars with two seated rest breaks with min A for weight shift and wheelchair follow  7/31: able to ambulate in parallel bars 3 ft with min assist and +2 follow in WC> difficulty advancing the LLE on this day. 12/27/2023- Patient unable to take a step forward today but  was abel to take a step on previous visit- limited by clonus like trembling that has progressed over past month. 03/20/24: 26' at one time Goal status: MET   3.  Patient will improve FIST score >6 points to demonstrate improved stability and ability to perform ADLs.  Baseline:  3/6: 39/56 4/8: 47/ 56 5/20: 48/56 7/31:46/56 Goal status: MET  4.  Patient will perform toileting with mod I at home for improved independence.  Baseline: 3/6: requires assistance 4/8: requires dependence on machine 5/20: requires assistance with use of wipe buddy.  7/31: continues to require use of sara steady and wipe buddy for safety and hygiene. 12/27/2023- Patient reports that since she is unable to take steps that she continues to require use of sara steady and wipe buddy for safe personal hygiene. 03/20/24: Pt utilizing sara stedy to perform safe personal hygiene, however is no longer utilizing the wipe buddy. 12/30: using sara stedy for toileting, able to clean independently  Goal status: PROGRESSING   5. Patient will perform > 5 min of static or dynamic standing with BUE and Min/mod assist  for improved LE strength and pre-gait function to assist with toileting and overall standing ability for improve functional independence.  Baseline: 12/27/2023-Patient was able to stand for just over 3 min today. 01/10/24: Pt able to stand 1 min 38 sec 02/14/24: Pt able to >3 minutes at this time. 03/20/24: Pt able to achieve >4 minutes at this time. Goal status: PROGRESSING  6.  Patient will ambulate 40 ft with BRW with wheelchair follow, in one attempt, without rest break. Baseline: 54' during one attempt with sock 12/30: 21.2 ft with BrW wheelchair follow and no sock Goal status: Partially Met   ASSESSMENT:  CLINICAL IMPRESSION:  Patient is able to take 3 steps this session and obtain a full standing position. Extensive trembling of LLE noted in standing position. LE strengthening tolerated well in seated position  with fatigue. Patient is highly motivated throughout session.  Pt will continue to benefit from skilled therapy to address remaining deficits in order to improve overall QoL and return to PLOF.        OBJECTIVE IMPAIRMENTS: Abnormal gait, cardiopulmonary status limiting activity, decreased activity tolerance, decreased balance, decreased coordination, decreased endurance, decreased mobility, difficulty walking, decreased ROM, decreased strength, hypomobility, increased fascial restrictions, impaired perceived functional ability, impaired flexibility, impaired sensation, improper body mechanics, and postural dysfunction.   ACTIVITY LIMITATIONS: carrying, lifting, bending, sitting, standing, squatting, sleeping, stairs, transfers, bed mobility, continence, bathing, toileting, dressing, self feeding, reach over head, hygiene/grooming, locomotion level, and caring for others  PARTICIPATION LIMITATIONS: meal prep, cleaning, laundry, interpersonal relationship, driving, shopping, community activity, and church  PERSONAL FACTORS: Age, Past/current  experiences, Time since onset of injury/illness/exacerbation, Transportation, and 3+ comorbidities: ack pain, CBP, chronic L shoulder pain, galactorrhea, neuropathy, HPV, hypercholestermeia, HTN, MS, osteopenia, PONV, wrist fracture are also affecting patient's functional outcome.   REHAB POTENTIAL: Good  CLINICAL DECISION MAKING: Evolving/moderate complexity  EVALUATION COMPLEXITY: Moderate  PLAN:  PT FREQUENCY: 2x/week  PT DURATION: 12 weeks  PLANNED INTERVENTIONS: 97164- PT Re-evaluation, 97110-Therapeutic exercises, 97530- Therapeutic activity, 97112- Neuromuscular re-education, 97535- Self Care, [id number]- Manual therapy, [phone]- Gait training, [phone]- Orthotic Fit/training, [phone]- Canalith repositioning, [id number]- Aquatic Therapy, [phone]- Electrical stimulation (unattended), [phone]- Electrical stimulation (manual), [id number]- Ultrasound, [id number]- Traction  (mechanical), Patient/Family education, Balance training, Stair training, Taping, Dry Needling, Joint mobilization, Joint manipulation, Spinal manipulation, Spinal mobilization, Scar mobilization, Compression bandaging, Vestibular training, Visual/preceptual remediation/compensation, Cognitive remediation, DME instructions, Cryotherapy, Moist heat, and Biofeedback  PLAN FOR NEXT SESSION:  Strap stand frame with more dynamic activities Sit to stands; continue supported walking, continue dynamic core stabilization training and seated reaching tasks to improve weight shifting.  Continue with ambulation in the hallway with the bariatric walker, sock, and use of cushion for easier lift off.   Lisa Williams, PT, DPT Physical Therapist - [facility]  Outpatient Physical Therapy- Main Campus [phone]    05/27/24, 10:59 AM  "

## 2024-05-27 ENCOUNTER — Ambulatory Visit

## 2024-05-27 ENCOUNTER — Ambulatory Visit: Payer: PPO

## 2024-05-27 DIAGNOSIS — M6281 Muscle weakness (generalized): Secondary | ICD-10-CM

## 2024-05-27 DIAGNOSIS — R278 Other lack of coordination: Secondary | ICD-10-CM

## 2024-05-27 DIAGNOSIS — R2681 Unsteadiness on feet: Secondary | ICD-10-CM

## 2024-05-27 DIAGNOSIS — G35D Multiple sclerosis, unspecified: Secondary | ICD-10-CM

## 2024-05-27 NOTE — Therapy (Signed)
 " OUTPATIENT OCCUPATIONAL THERAPY NEURO TREATMENT NOTE  Patient Name: Lisa Williams MRN: [id number] DOB:08-24-1960, 64 y.o., female Today's Date: 05/27/2024  PCP: Dr. Layman Piety   Neurologist Dr. Suanne at [facility] REFERRING PROVIDER: Dr. Layman Piety    END OF SESSION:  OT End of Session - 05/27/24 1102     Visit Number 69    Number of Visits 78    Date for Recertification  07/29/24    Authorization Time Period Reporting period beginning 04/10/24    Progress Note Due on Visit 70    OT Start Time 1100    OT Stop Time 1145    OT Time Calculation (min) 45 min    Equipment Utilized During Treatment manual wc    Activity Tolerance Patient tolerated treatment well    Behavior During Therapy [facility] for tasks assessed/performed         Past Medical History:  Diagnosis Date   Abdominal pain, right upper quadrant    Back pain    Calculus of kidney 12/09/2013   Chronic back pain    unspecified   Chronic left shoulder pain 07/19/2015   Complication of anesthesia    Functional disorder of bladder    other   Galactorrhea 11/26/2014   Chronic    Hereditary and idiopathic neuropathy 08/19/2013   High cholesterol    History of kidney stones    HPV test positive    Hypercholesteremia 08/19/2013   Hypertension    Hypertension    Hypothyroidism    Incomplete bladder emptying    Microscopic hematuria    MS (multiple sclerosis)    Muscle spasticity 05/21/2014   Nonspecific findings on examination of urine    other   Osteopenia    PONV (postoperative nausea and vomiting)    Prediabetes    Sickle cell trait    Status post laparoscopic supracervical hysterectomy 11/26/2014   Tobacco user 11/26/2014   Wrist fracture    Past Surgical History:  Procedure Laterality Date   bilateral tubal ligation  1996   BREAST CYST EXCISION Left 2002   CYST EXCISION Left 05/10/2022   Procedure: CYST REMOVAL;  Surgeon: Rodolph Romano, MD;  Location: [facility] ORS;  Service:  General;  Laterality: Left;   FRACTURE SURGERY     KNEE SURGERY     right   LAPAROSCOPIC SUPRACERVICAL HYSTERECTOMY  08/05/2013   ORIF WRIST FRACTURE Left 01/17/2017   Procedure: OPEN REDUCTION INTERNAL FIXATION (ORIF) WRIST FRACTURE;  Surgeon: Leora Lynwood SAUNDERS, MD;  Location: [facility] ORS;  Service: Orthopedics;  Laterality: Left;   RADIOLOGY WITH ANESTHESIA N/A 03/18/2020   Procedure: MRI WITH ANESTHESIA CERVICAL SPINE AND BRAIN  WITH AND WITHOUT CONTRAST;  Surgeon: Radiologist, Medication, MD;  Location: [facility] OR;  Service: Radiology;  Laterality: N/A;   right tibia fracture Right    2024   TUBAL LIGATION Bilateral    VAGINAL HYSTERECTOMY  03/2006   VAGINAL HYSTERECTOMY     2014   Patient Active Problem List   Diagnosis Date Noted   Health care maintenance 04/21/2024   SOB (shortness of breath) on exertion 03/12/2024   Hyperglycemia 01/09/2024   Abnormal LFTs (liver function tests) 09/17/2023   Sinus tachycardia 07/07/2023   Aortic atherosclerosis 01/18/2023   Prediabetes 01/18/2023   Hypokalemia 07/29/2022   Weakness of both lower extremities 07/29/2022   Abscess of left groin 11/15/2021   Abnormal LFTs 11/15/2021   Acute respiratory disease due to COVID-19 virus 11/21/2020   Fatigue due to excessive exertion  Hypoalbuminemia due to protein-calorie malnutrition    Neurogenic bowel    Neurogenic bladder    Labile blood pressure    Neuropathic pain    Multiple sclerosis, relapsing-remitting 07/09/2019   Abscess of female pelvis    SVT (supraventricular tachycardia)    Radial styloid tenosynovitis 03/12/2018   Wheelchair dependence 02/27/2018   Localized osteoporosis with current pathological fracture with routine healing 01/19/2017   Wrist fracture 01/16/2017   Sprain of ankle 03/23/2016   Closed fracture of lateral malleolus 03/16/2016   Depression screening 01/24/2016   Blood pressure elevated without history of HTN 10/25/2015   Essential hypertension 10/25/2015    Multiple sclerosis 10/02/2015   Chronic left shoulder pain 07/19/2015   Multiple sclerosis exacerbation 07/14/2015   MS (multiple sclerosis) 11/26/2014   Increased body mass index 11/26/2014   HPV test positive 11/26/2014   Status post laparoscopic supracervical hysterectomy 11/26/2014   Galactorrhea 11/26/2014   Back ache 05/21/2014   Adiposity 05/21/2014   Disordered sleep 05/21/2014   Muscle spasticity 05/21/2014   Spasticity 05/21/2014   Calculus of kidney 12/09/2013   Renal colic 12/09/2013   Hypercholesteremia 08/19/2013   Hereditary and idiopathic neuropathy 08/19/2013   Hypercholesterolemia without hypertriglyceridemia 08/19/2013   Bladder infection, chronic 07/25/2012   Disorder of bladder function 07/25/2012   Incomplete bladder emptying 07/25/2012   Microscopic hematuria 07/25/2012   Right upper quadrant pain 07/25/2012   ONSET DATE: 5/24 Amon and broke R leg which caused beginning of ADL decline; diagnosed with MS in 1995)  REFERRING DIAG: MS  THERAPY DIAG:  Muscle weakness (generalized)  Multiple sclerosis exacerbation  Other lack of coordination  Rationale for Evaluation and Treatment: Rehabilitation  SUBJECTIVE:  SUBJECTIVE STATEMENT: Pt reports she's been dropping her eating utensils a lot lately, and requests to focus on dexterity skills today.  Pt accompanied by: self  PERTINENT HISTORY: Pt reports increased difficulty with basic self care tasks since breaking her R leg last May.  Since that time, mobility and ADLs have declined, with pt requiring assist from private caregivers and family to manage bathing, toileting, dressing, and functional transfers.  PRECAUTIONS: Fall  WEIGHT BEARING RESTRICTIONS: No  PAIN: 05/27/24: No pain today  Are you having pain? No; occasional pain in R lower back, but not today  FALLS: Has patient fallen in last 6 months? No Last fall was May 17 of 2024  LIVING ENVIRONMENT: Lives with: lives with their family  (including mother who has dementia and sister [name])  Lives in: 2 level home but pt resides on main level Stairs: ramp  Has following equipment at home: Wheelchair (manual), Shower bench, and hand held shower shower, 1 grab bar in the shower, 3in1 commode, sit to stand lift (electric), FWW, hospital bed  PLOF: modified indep with ADL/IADLs prior to May of 7975   PATIENT GOALS: Increase independence with basic self care tasks  OBJECTIVE:  Note: Objective measures were completed at Evaluation unless otherwise noted.  HAND DOMINANCE: Right  ADLs:  Overall ADLs: assist provided from paid caregiver and sister Transfers/ambulation related to ADLs: sit to stand lift for all transfers, set up for sliding board in/out of bed  Eating: indep  Grooming: modified indep (wc level in mom's bathroom)  UB Dressing: set up (can't access her bedroom closet)  LB Dressing: able to sit on side of bed to don all LB clothing; assist to hike pants/underwear Toileting: assist with clothing management d/t standing within electric lift Bathing: bed bath with sister helping to wash  backside Tub Shower transfers: N/A; pt has walk in shower in mom's bathroom (wc does fit in this bathroom but pt reports inability to transfer from wc and requires arm rests on a bench for a successful transfer attempt from any DME).  Tub bench is in pt's bathroom, but lift does not fit through bathroom doorway and pt reports inability to transfer up from tub bench (currently unable to manage either transfer)   Equipment: see above  IADLs: Shopping: [employer] for shopping Light housekeeping: modified indep from wc level Meal Prep: modified indep from wc level Community mobility: relies on community transportation or family members Medication management: indep Landscape architect: indep Handwriting: NT; pt denies any FMC challenges  MOBILITY STATUS: Hx of falls  POSTURE COMMENTS:  Rounded shoulders, forward head, anterior  pelvic tilt, and weight shift left   ACTIVITY TOLERANCE: Eval: Activity tolerance: Per PT; currently standing 30-60 sec within Light Gait harness/lift; currently non-ambulatory  01/24/24: Standing at sink: 1 minute; min A and 5 attempts to achieve fully erect standing position with BUEs supported on sink countertop, OT assist to block R knee to prevent buckling   02/28/24: x4 attempts, achieves 75% upright standing with MIN A to block knees.   UPPER EXTREMITY ROM:  BUEs WFL  UPPER EXTREMITY MMT:     MMT Right eval Right 05/06/24 Left eval Left 05/06/24  Shoulder flexion 4+ 4+ 4 4  Shoulder abduction 4+ 4+ 4 4  Shoulder adduction      Shoulder extension      Shoulder internal rotation 4+ 4+ 4+ 4+  Shoulder external rotation 4 4 4 4   Middle trapezius      Lower trapezius      Elbow flexion 4+ 5 4+ 5  Elbow extension 4+ 5 4+ 5  Wrist flexion 4+ 4+ 4+ 4+  Wrist extension 4+ 5 4+ 4+  Wrist ulnar deviation      Wrist radial deviation      Wrist pronation      Wrist supination      (Blank rows = not tested)  HAND FUNCTION/COORDINATION:  R/L WNL; pt denies any coordination deficits/ able to manipulate pills/clothing fasteners/etc without difficulty  11/15/23: *Measures taken d/t PT note revealing pt with increased difficulty manipulating smaller ADL supplies during reaching activities in PT session   11/15/23: Grip strength: R: 70 lbs, L: 63 lbs Pinch strength: Lateral: R: 16 lbs, L: 15 lbs; 3 point pinch: R: 12 lbs, L: 13 lbs 9 hole peg test: Right: 25 sec, L 49 sec   12/25/23: Grip strength: Right: 70 lbs; Left: 65 lbs  9 hole peg test: Right: 24 sec; Left: 3 trials: 54 sec, 1 min, 56 sec  01/24/24: Grip grip: Right: 76 lbs; Left: 67 lbs Lateral pinch: R: 17 lbs, L: 17 lbs; 3 point pinch: R: 13 lbs, L: 14 lbs  9 Hole Peg Test: R: 26 sec, L: 50 sec  02/28/24: Grip: Right: 85 lbs; Left: 71 lbs Lateral pinch: R: 20 lbs, L: 19 lbs; 3 point pinch: R: 17 lbs, L: 18 lbs  9 Hole  Peg Test: R: 29 sec, L: 40 sec   04/10/24: Grip: Right: 85 lbs; Left: 81 lbs;   Lateral pinch: R: 18 lbs, L: 18 lbs; 3 point pinch: R: 17 lbs, L: 18 lbs   9 hole Peg Test: R: 25 sec, L 32 sec    05/06/24: Right: 85 lbs; Left: 78 lbs;  Lateral pinch: Right: 20 lbs, L: 17 lbs;  3 point pinch: R: 15 lbs, L: 16 lbs   SENSATION: WFL  EDEMA: No visible edema in BUEs  MUSCLE TONE: BUEs WNL  COGNITION: Overall cognitive status: Within functional limits for tasks assessed  VISION: wears glasses all the time, no reports of diplopia  PERCEPTION: Not tested  PRAXIS: WFL  OBSERVATIONS:  Pt pleasant, cooperative, and motivated to work towards improving indep with ADLs.                                                                                                     TREATMENT DATE: 05/27/24 Therapeutic Activity: -Facilitated L/R hand FMC/dexterity working on small item storage in palm, translation palm to tip with alphabet dice, and object repositioning within fingertips.  Pt practiced manipulation skills with 1 dice, progressing to 2 and 3 dice at once time.  Progressed to smaller items and varied shapes for same skills, including toothpicks, small pegs, flat washers, and small cubed beads.  Items placed on non-skid surface for easier item pick up.  Min vc for translation skills palm to tip and to reduce substitution patterns. -Practiced manipulation skills with fork/knife, alternating hands for 1 handed pick up from table, and 1 handed repositioning of utensil for prehension set up, requiring utensil rotation and transition from 5 fingered grasp pattern to 3 fingered grasp pattern.  Utensils were placed at a variety of angles, including right side up and right side down on table top.    PATIENT EDUCATION: Education details: FMC/dexterity skills Person educated: Patient Education method: Explanation and Verbal cues Education comprehension: verbalized understanding, returned demonstration, and  verbal cues required  HOME EXERCISE PROGRAM: IR A/AAROM to promote shoulder flexibility for peri care/bathing  GOALS: Goals reviewed with patient? Yes  SHORT TERM GOALS: Target date: 12/25/23  Pt will perform bed bath with set up only. Baseline: Eval: Min-mod A for posterior washing; 09/28/23: Min A for posterior washing; 11/13/23: Able to manage bed bath but sister helps with thoroughness, per pt's preference; pt in agreement to trial bath sitting EOB or in wc before next OT session. Goal status: achieved/d/c  2.  Pt will utilize sliding board for wc<>drop arm commode transfer with SBA. Baseline: Eval: Currently using electric lift for transfer to [facility]; 09/28/23: Not yet completed at home but transfers to commode have been easier without sliding board d/t pt implementing a scoot pivot. Goal status: d/c (pt does not prefer sliding board for commode transfer)  3.  Pt will be indep to perform HEP for maintaining BUE strength for ADLs and functional transfers. Baseline: Eval: HEP not yet initiated; 09/28/23: Pt is working on core stability exercises from wc, cane stretches for bilat shoulder IR, and tricep extension via wc pushups; 11/13/23: indep Goal status: achieved  LONG TERM GOALS: Target date: 07/29/24  Pt will perform sink bath with set up A. (Revised on 11/13/23 from min A to set up) Baseline: Eval: Currently bed bathing d/t inability to stand at sink without electric lift (lift does not fit into pt's bathroom); 09/28/23: still completing bed bath as pt is not yet able to  stand; 11/13/23: Not yet attempted; pt encouraged to attempt before next OT session now that bed bath goal has been met; 12/20/23: Pt is now completing sponge bath with min A while seated EOB (caregiver assists with washing peri area d/t no bed rail to aid in R lateral lean); 01/24/24: Performing with set up on EOB, completing lateral weight shifts to wash posterior/peri area with wc in front of pt and bed rail for LUE support and foot  of bed for RUE support  Goal status: achieved  2.  Pt will perform wc<>tub bench transfer with min A. Baseline: Eval: Unable; 09/28/23: Performed in OT clinic with min A, but not yet in the home Goal status: d/c (pt prefers sink bath)  3.  Pt will perform squat pivot transfer wc<>BSC with SBA.  Baseline: Eval: Eval: Currently using electric lift for transfer to [facility]; 09/28/23: Not yet completed in clinic with [facility], and using lift for [facility] in the home still; 11/13/23: Still using Camie lift at home and in clinic transfers are all scoot rather than squat pivots d/t LE weakness; 12/20/23: Pt reports that she plans to use her Camie lift until she can ambulate to her [facility] d/t limited space for wc in her desired location for [facility] at home. Goal status: d/c  4. Pt will perform scoot transfer wc<>toilet using grab bars with supv to allow toileting in community setting.  Baseline: Eval: Pt limits community outings d/t requiring use of lift for [facility] transfers; 09/28/23: Pt has performed 2 successful scoot pivot  Transfers to toilet in OT clinic with min A, extra time.  Further trials with clothing management on toilet needed, but pt can lower and hike  pants while sititng edge of mat via lateral leaning and min A to maintain core stability; 11/13/23: Pt performs with CGA and extra time; 12/19/23: Pt  performs with close supv on most attempts, occasional CGA; efficiency is improving; 01/24/24: ongoing improvements; able to complete in fewer scoots (3 scoots each way from wc<>toilet) d/t improve engagement of Les and improved anterior weight shift; occasional min guard-supv; pt completes with therapist in clinic, but not yet in other community settings with sister, and not yet voided on toilet in community. 02/28/24: has not trialed in community, completes with SBA; 04/10/24: deferred in recent visits to focus on standing for sink bath, per pt request; 05/06/24: Pt working on scoot transfers wc<>mat table, not yet attempting on  toilet.  Pt is able to achieve some clearance of buttocks during transfer, but requires at least 5 lifts to complete transfer.   Goal status: ongoing  5.  Pt will complete seated clothing management with min A to enable voiding in handicapped stall within community. Baseline: Recert 11/13/23: Pt can transfer to toilet with increased time and effort, but has not yet attempted clothing management or voiding while seated on commode in community setting.  Pt can lower pants to knees while seated in wc, but requires mod A to hike over hips from seated position; 12/20/23: Pt fully lowers panties and shorts while seated on commode with supv, can hike clothing to her her upper thighs, transfers back to wc, and then performs a tricep extension from wc level to allow caregiver to hike clothing remainder of the way for purposes of EC (min A for clothing management component) Goal status: achieved  6.  Pt will tolerate dynamic standing x2 min with CGA, alternating 1 hand on walker for support to allow pt to complete peri care and  towel drying while standing at sink for bath. Baseline: Eval: Currently standing in electric lift only, or within parallel bars without lift; 09/28/23: Not yet attempted; 11/13/23: Pt is able to perform static standing with heavy BUE support on parallel bars with PT; 12/19/22: Not attempted recently d/t new intermittent clonus in BLEs; 01/24/24: Tolerated standing 1 min today at sink with min A for ascent, OT assist to block R knee from buckling. 02/28/24: tolerates up to 1 min standing, difficulty achieving stand 2/2 fatigue from PT; 04/10/24: Pt is inconsistent to achieve full standing position, but when able, can tolerate 1-2 min with CGA-min A and RW for support.  Pt was able to remove R hand briefly from walker (~3 sec or less) to touch R buttocks to simulate reach for clothing management and peri care; 05/06/24: Pt predominantly requires BUE support on walker in standing, but has been able to  remove 1 hand from walker for 3 sec or less with CGA-min A on a couple of attempts during therapy. Goal status: improved/revised   7.  Pt will increase bilat grip strength in order to securely grasp walker sufficiently for functional transfers and ambulation.  Baseline: Recert 11/13/23: TBD (PT note read following OT session, indicating pt with difficulty securing items in hand);  11/15/23: Grip strength: R: 70 lbs, L: 63 lbs; 12/25/23: R 76 lbs, L 67 lbs; sufficient for functional transfers/amb  Goal status: achieved  8.  Pt will increase L [facility] skills in order to manipulate small ADL supplies with reduced dropping as indicated by 20 sec or more improvement in 9 hole peg test. Baseline: Recert 11/13/23: 9 hole peg test TBD (PT note read following OT session, indicating pt with difficulty manipulating and securing items in hands); 11/15/23: Right: 25 sec, L 49 sec; 12/25/23: Right: 24 sec; Left: 3 trials: 54 sec, 1 min, 56 sec; 01/24/24: R: 26 sec, L: 50 sec. 02/28/24: L: 40 sec; 04/10/24: R 25 sec, L 32 sec; 05/06/24: R: 25 sec; L: 41 sec (pt reports dropping silverware often)  Goal status: in progress  9.  Pt will increase core stability as demonstrated by ability to maintain midline sitting EOB without UE support while crossing legs to dress and lotion BLEs. Baseline: Recert: 05/06/24: Pt reports mild-moderate shifts from midline while crossing legs, with occasional need for 1 arm support on EOB to maintain sitting balance. Goal status: New  ASSESSMENT: CLINICAL IMPRESSION: Focus today on R/L FMC/dexterity skills, as pt reports she's been dropping things a lot and fumbling with her fork and knife.  Pt demonstrated very occasional dropping with manipulatives today in the R hand, and more frequent dropping with the L hand.  Pt did demonstrate improvement with moving a variety of small manipulates of different shapes and sizes from palm to thumb and tip of IF today, where as pt typically drops items when  sliding them up the volar IF and often loses control before reaching the DIP joint.  Pt continues to be challenged with small item storage and translation of small items from palm to tip, with more difficulty in the L hand as demonstrated by more dropped stones with the L hand vs the R.  Pt will continue to benefit from skilled OT to work towards above noted goals in OT poc, working to maximize indep with daily tasks while reducing burden of care on caregivers.   PERFORMANCE DEFICITS: in functional skills including ADLs, IADLs, strength, pain, flexibility, Gross motor control, mobility, balance, body mechanics, endurance, and  decreased knowledge of use of DME, and psychosocial skills including coping strategies, environmental adaptation, habits, and routines and behaviors.   IMPAIRMENTS: are limiting patient from ADLs, IADLs, and social participation.   CO-MORBIDITIES: has co-morbidities such as neuropathy, back pain, obesity, MS, HTN that affects occupational performance. Patient will benefit from skilled OT to address above impairments and improve overall function.  MODIFICATION OR ASSISTANCE TO COMPLETE EVALUATION: No modification of tasks or assist necessary to complete an evaluation.  OT OCCUPATIONAL PROFILE AND HISTORY: Detailed assessment: Review of records and additional review of physical, cognitive, psychosocial history related to current functional performance.  CLINICAL DECISION MAKING: Moderate - several treatment options, min-mod task modification necessary  REHAB POTENTIAL: Good  EVALUATION COMPLEXITY: Moderate  PLAN:  OT FREQUENCY: 1x/week  OT DURATION: 12 weeks  PLANNED INTERVENTIONS: 97168 OT Re-evaluation, 97535 self care/ADL training, 02889 therapeutic exercise, 97530 therapeutic activity, 97112 neuromuscular re-education, 97140 manual therapy, 97116 gait training, 02989 moist heat, 97010 cryotherapy, balance training, functional mobility training, psychosocial skills  training, energy conservation, coping strategies training, patient/family education, and DME and/or AE instructions  RECOMMENDED OTHER SERVICES: None at this time (Pt currently receiving PT services in this clinic)  CONSULTED AND AGREED WITH PLAN OF CARE: Patient  PLAN FOR NEXT SESSION: see above  Inocente Blazing, MS, OTR/L  Inocente MARLA Blazing, OT 05/27/2024, 11:16 AM     "

## 2024-05-28 ENCOUNTER — Ambulatory Visit (INDEPENDENT_AMBULATORY_CARE_PROVIDER_SITE_OTHER): Admitting: Nurse Practitioner

## 2024-05-28 ENCOUNTER — Encounter (INDEPENDENT_AMBULATORY_CARE_PROVIDER_SITE_OTHER): Payer: Self-pay | Admitting: Nurse Practitioner

## 2024-05-28 VITALS — BP 147/78 | HR 58 | Temp 97.5°F | Ht 71.0 in | Wt 240.0 lb

## 2024-05-28 DIAGNOSIS — I10 Essential (primary) hypertension: Secondary | ICD-10-CM

## 2024-05-28 DIAGNOSIS — E039 Hypothyroidism, unspecified: Secondary | ICD-10-CM | POA: Diagnosis not present

## 2024-05-28 DIAGNOSIS — G35D Multiple sclerosis, unspecified: Secondary | ICD-10-CM

## 2024-05-28 DIAGNOSIS — Z6833 Body mass index (BMI) 33.0-33.9, adult: Secondary | ICD-10-CM

## 2024-05-28 DIAGNOSIS — E66811 Obesity, class 1: Secondary | ICD-10-CM | POA: Diagnosis not present

## 2024-05-28 MED ORDER — WEGOVY 0.25 MG/0.5ML ~~LOC~~ SOAJ: 0.2500 mg | SUBCUTANEOUS | 0 refills | Status: AC

## 2024-05-28 NOTE — Progress Notes (Signed)
 " Office: [phone]  /  Fax: [phone]  WEIGHT SUMMARY AND BIOMETRICS  Weight Lost Since Last Visit: 0  Weight Gained Since Last Visit: 1 lb   Vitals Temp: (!) 97.5 F (36.4 C) BP: (!) 147/78 Pulse Rate: (!) 58 SpO2: 99 %   Anthropometric Measurements Height: 5' 11 (1.803 m) Weight: 240 lb (108.9 kg) BMI (Calculated): 33.49 Weight at Last Visit: 239 lb Weight Lost Since Last Visit: 0 Weight Gained Since Last Visit: 1 lb Starting Weight: 236 lb Total Weight Loss (lbs): 0 lb (0 kg)   No data recorded Other Clinical Data Fasting: no Labs: no Today's Visit #: 4 Starting Date: 03/12/24 Comments: wheel chair, no strip    Total Weight Loss:0  HPI  Chief Complaint: OBESITY  Lisa Williams is here to discuss her progress with her obesity treatment plan. She is on the the Category 1 Plan and states she is following her eating plan approximately 90 % of the time. She states she is exercising with therapy 2 days a week of PT and OT.    Interval History:  Since last office visit she has been getting in 80 grams of protein. Water about 32 ounces a day. She is eating more fruits and vegetables. She drinks Fair life milk. She is staying away from sweets but had a few at Christmas and New Years. She has tried [employer]. She also has Activia with 15 grams protein.   Breakfast- sausage, egg and yogurt.  Lunch:  Sandwich or protein shake Dinner: Meat and vegetables.   Levothyroxine  88 mcg daily and last TSH was in normal range DEXA done 05/19/24 and revealed osteoporosis- increasing calcium  and continue Vit D.   Laquanna does have hypertension and her BP's are currently well controlled on telmisartan  40 mg  every day. Denies headaches, chest pain shortness of breath at rest and dizziness  BP Readings from Last 3 Encounters:  05/28/24 (!) 147/78  04/23/24 128/69  04/21/24 124/70    PHYSICAL EXAM:  Blood pressure (!) 147/78, pulse (!) 58,  temperature (!) 97.5 F (36.4 C), height 5' 11 (1.803 m), weight 240 lb (108.9 kg), SpO2 99%. Body mass index is 33.47 kg/m.  General: Well Developed, well nourished, and in no acute distress.  HEENT: Normocephalic, atraumatic; EOMI, sclerae are anicteric. Skin: Warm and dry, good turgor Chest:  Normal excursion, shape, no gross ABN Respiratory: No conversational dyspnea; speaking in full sentences NeuroM-Sk:  Normal gross ROM * 4 extremities  Psych: A and O X 3, insight adequate, mood- full    DIAGNOSTIC DATA REVIEWED:  BMET    Component Value Date/Time   NA 140 04/21/2024 1452   NA 142 03/12/2024 1056   NA 139 02/15/2013 1700   K 4.3 04/21/2024 1452   K 3.8 02/15/2013 1700   CL 105 04/21/2024 1452   CL 109 (H) 02/15/2013 1700   CO2 28 04/21/2024 1452   CO2 25 02/15/2013 1700   GLUCOSE 85 04/21/2024 1452   GLUCOSE 94 02/15/2013 1700   BUN 26 (H) 04/21/2024 1452   BUN 22 03/12/2024 1056   BUN 20 (H) 02/15/2013 1700   CREATININE 0.54 04/21/2024 1452   CREATININE 0.59 (L) 02/15/2013 1700   CALCIUM  9.7 04/21/2024 1452   CALCIUM  9.0 02/15/2013 1700   GFRNONAA >60 07/08/2023 0620   GFRNONAA >60 02/15/2013 1700   GFRAA 110 05/13/2020 1146   GFRAA >60 02/15/2013 1700   Lab Results  Component Value Date   HGBA1C 5.3 04/21/2024  HGBA1C 5.1 08/01/2011   Lab Results  Component Value Date   INSULIN  8.0 03/12/2024   Lab Results  Component Value Date   TSH 3.770 05/13/2024   CBC    Component Value Date/Time   WBC 6.2 01/09/2024 1124   RBC 4.70 01/09/2024 1124   HGB 12.5 01/09/2024 1124   HGB 11.9 12/17/2023 1549   HCT 38.0 01/09/2024 1124   HCT 35.3 12/17/2023 1549   PLT 216.0 01/09/2024 1124   PLT 185 12/17/2023 1549   MCV 80.8 01/09/2024 1124   MCV 85 12/17/2023 1549   MCV 79 (L) 02/15/2013 1700   MCH 28.7 12/17/2023 1549   MCH 26.6 07/08/2023 0620   MCHC 33.0 01/09/2024 1124   RDW 15.3 01/09/2024 1124   RDW 15.3 12/17/2023 1549   RDW 13.6 02/15/2013  1700   Iron Studies    Component Value Date/Time   IRON 71 08/07/2019 0450   TIBC 253 08/07/2019 0450   FERRITIN 36 08/07/2019 0450   IRONPCTSAT 28 08/07/2019 0450   Lipid Panel     Component Value Date/Time   CHOL 154 04/21/2024 1452   CHOL 187 11/29/2016 0000   TRIG 57.0 04/21/2024 1452   HDL 49.70 04/21/2024 1452   HDL 55 11/29/2016 0000   CHOLHDL 3 04/21/2024 1452   VLDL 11.4 04/21/2024 1452   LDLCALC 92 04/21/2024 1452   LDLCALC 119 (H) 11/29/2016 0000   Hepatic Function Panel     Component Value Date/Time   PROT 6.9 04/21/2024 1452   PROT 7.2 03/12/2024 1056   PROT 7.2 02/15/2013 1700   ALBUMIN 4.3 04/21/2024 1452   ALBUMIN 4.4 03/12/2024 1056   ALBUMIN 3.6 02/15/2013 1700   AST 13 04/21/2024 1452   AST 20 02/15/2013 1700   ALT 12 04/21/2024 1452   ALT 15 02/15/2013 1700   ALKPHOS 134 (H) 04/21/2024 1452   ALKPHOS 127 02/15/2013 1700   BILITOT 0.4 04/21/2024 1452   BILITOT 0.5 03/12/2024 1056   BILITOT 0.3 02/15/2013 1700   BILIDIR 0.1 04/21/2024 1452   BILIDIR 0.11 12/17/2023 1549      Component Value Date/Time   TSH 3.770 05/13/2024 1002   TSH 14.87 (H) 02/25/2024 1051   Nutritional Lab Results  Component Value Date   VD25OH 76.9 03/12/2024   VD25OH 33.3 08/01/2021     ASSESSMENT AND PLAN Class 1 obesity with serious comorbidity and body mass index (BMI) of 33.0 to 33.9 in adult, unspecified obesity type TREATMENT PLAN FOR OBESITY:  Recommended Dietary Goals  Aleysia is currently in the action stage of change. As such, her goal is to continue weight management plan. She has agreed to the Category 1 Plan.  Behavioral Intervention  We discussed the following Behavioral Modification Strategies today: increasing lean protein intake to established goals, increasing fiber rich foods, avoiding skipping meals, increasing water intake , continue to work on maintaining a reduced calorie state, getting the recommended amount of protein,  incorporating whole foods, making healthy choices, staying well hydrated and practicing mindfulness when eating., and increase protein intake, fibrous foods (25 grams per day for women, 30 grams for men) and water to improve satiety and decrease hunger signals. .    Recommended Physical Activity Goals  Daysi has been advised to work up to 150 minutes of moderate intensity aerobic activity a week and strengthening exercises 2-3 times per week for cardiovascular health, weight loss maintenance and preservation of muscle mass.   She has agreed to Continue current level of physical  activity  and Increase physical activity in their day and reduce sedentary time (increase NEAT).   Pharmacotherapy We discussed various medication options to help Sakia with her weight loss efforts and we both agreed to start Wegovy  0.25 mg SQ QW. Counseled on use and side effects. Denies family history of MEN2 and MTC, denies pancreatitis. Patient was counseled on the importance of maintaining healthy lifestyle habits, including balanced nutrition, regular physical activity, and behavioral modifications, while taking antiobesity medication.  Patient verbalized understanding that medication is an adjunct to, not a replacement for, lifestyle changes and that the long-term success and weight maintenance depend on continued adherence to these strategies.    ASSOCIATED CONDITIONS ADDRESSED TODAY  Action/Plan  Essential hypertension Continue telmisartan  40 mg every day Continue Category 1 meal plan  and DASH diet Monitor BP and if consistently >140/90 notify PCP If develops headaches, chest pain, shortness of breath or dizziness go to ER  Multiple sclerosis       Continue to follow regularly with neurology       Continue PT/OT to maintain functioning  Hypothyroidism, unspecified type       Continue Levothyroxine  88 mcg daily       Monitor symptoms and follow regularly with PCP  Class 1 obesity with  serious comorbidity and body mass index (BMI) of 33.0 to 33.9 in adult, unspecified obesity type See plan above -     Wegovy ; Inject 0.25 mg into the skin once a week.  Dispense: 2 mL; Refill: 0         Return in about 4 weeks (around 06/25/2024).[name] She was informed of the importance of frequent follow up visits to maximize her success with intensive lifestyle modifications for her multiple health conditions.   ATTESTASTION STATEMENTS:  Reviewed by clinician on day of visit: allergies, medications, problem list, medical history, surgical history, family history, social history, and previous encounter notes.     [name] Liverpool ANP-C "

## 2024-05-28 NOTE — Therapy (Signed)
 "       OUTPATIENT PHYSICAL THERAPY NEURO TREATMENT     Patient Name: Lisa Williams MRN: [id number] DOB:01/20/1961, 64 y.o., female Today's Date: 05/29/2024  PCP: Lenon Na  REFERRING PROVIDER: Lenon Na   END OF SESSION:  PT End of Session - 05/29/24 1012     Visit Number 83    Number of Visits 94    Date for Recertification  06/12/24   corrected   Progress Note Due on Visit 50    PT Start Time 1014    PT Stop Time 1058    PT Time Calculation (min) 44 min    Equipment Utilized During Treatment Gait belt    Activity Tolerance Patient tolerated treatment well;No increased pain    Behavior During Therapy [facility] for tasks assessed/performed                Past Medical History:  Diagnosis Date   Abdominal pain, right upper quadrant    Back pain    Calculus of kidney 12/09/2013   Chronic back pain    unspecified   Chronic left shoulder pain 07/19/2015   Complication of anesthesia    Functional disorder of bladder    other   Galactorrhea 11/26/2014   Chronic    Hereditary and idiopathic neuropathy 08/19/2013   High cholesterol    History of kidney stones    HPV test positive    Hypercholesteremia 08/19/2013   Hypertension    Hypertension    Hypothyroidism    Incomplete bladder emptying    Microscopic hematuria    MS (multiple sclerosis)    Muscle spasticity 05/21/2014   Nonspecific findings on examination of urine    other   Osteopenia    PONV (postoperative nausea and vomiting)    Prediabetes    Sickle cell trait    Status post laparoscopic supracervical hysterectomy 11/26/2014   Tobacco user 11/26/2014   Wrist fracture    Past Surgical History:  Procedure Laterality Date   bilateral tubal ligation  1996   BREAST CYST EXCISION Left 2002   CYST EXCISION Left 05/10/2022   Procedure: CYST REMOVAL;  Surgeon: Rodolph Romano, MD;  Location: [facility];  Service: General;  Laterality: Left;   FRACTURE SURGERY     KNEE  SURGERY     right   LAPAROSCOPIC SUPRACERVICAL HYSTERECTOMY  08/05/2013   ORIF WRIST FRACTURE Left 01/17/2017   Procedure: OPEN REDUCTION INTERNAL FIXATION (ORIF) WRIST FRACTURE;  Surgeon: Leora Lynwood SAUNDERS, MD;  Location: [facility];  Service: Orthopedics;  Laterality: Left;   RADIOLOGY WITH ANESTHESIA N/A 03/18/2020   Procedure: MRI WITH ANESTHESIA CERVICAL SPINE AND BRAIN  WITH AND WITHOUT CONTRAST;  Surgeon: Radiologist, Medication, MD;  Location: [facility];  Service: Radiology;  Laterality: N/A;   right tibia fracture Right    2024   TUBAL LIGATION Bilateral    VAGINAL HYSTERECTOMY  03/2006   VAGINAL HYSTERECTOMY     2014   Patient Active Problem List   Diagnosis Date Noted   Health care maintenance 04/21/2024   SOB (shortness of breath) on exertion 03/12/2024   Hyperglycemia 01/09/2024   Abnormal LFTs (liver function tests) 09/17/2023   Sinus tachycardia 07/07/2023   Aortic atherosclerosis 01/18/2023   Prediabetes 01/18/2023   Hypokalemia 07/29/2022   Weakness of both lower extremities 07/29/2022   Abscess of left groin 11/15/2021   Abnormal LFTs 11/15/2021   Acute respiratory disease due to COVID-19 virus 11/21/2020   Fatigue due to excessive exertion  Hypoalbuminemia due to protein-calorie malnutrition    Neurogenic bowel    Neurogenic bladder    Labile blood pressure    Neuropathic pain    Multiple sclerosis, relapsing-remitting 07/09/2019   Abscess of female pelvis    SVT (supraventricular tachycardia)    Radial styloid tenosynovitis 03/12/2018   Wheelchair dependence 02/27/2018   Localized osteoporosis with current pathological fracture with routine healing 01/19/2017   Wrist fracture 01/16/2017   Sprain of ankle 03/23/2016   Closed fracture of lateral malleolus 03/16/2016   Depression screening 01/24/2016   Blood pressure elevated without history of HTN 10/25/2015   Essential hypertension 10/25/2015   Multiple sclerosis 10/02/2015   Chronic left shoulder pain  07/19/2015   Multiple sclerosis exacerbation 07/14/2015   MS (multiple sclerosis) 11/26/2014   Increased body mass index 11/26/2014   HPV test positive 11/26/2014   Status post laparoscopic supracervical hysterectomy 11/26/2014   Galactorrhea 11/26/2014   Back ache 05/21/2014   Adiposity 05/21/2014   Disordered sleep 05/21/2014   Muscle spasticity 05/21/2014   Spasticity 05/21/2014   Calculus of kidney 12/09/2013   Renal colic 12/09/2013   Hypercholesteremia 08/19/2013   Hereditary and idiopathic neuropathy 08/19/2013   Hypercholesterolemia without hypertriglyceridemia 08/19/2013   Bladder infection, chronic 07/25/2012   Disorder of bladder function 07/25/2012   Incomplete bladder emptying 07/25/2012   Microscopic hematuria 07/25/2012   Right upper quadrant pain 07/25/2012    ONSET DATE: 1995  REFERRING DIAG: MS  THERAPY DIAG:  Muscle weakness (generalized)  Multiple sclerosis exacerbation  Unsteadiness on feet  Rationale for Evaluation and Treatment: Rehabilitation  SUBJECTIVE:                                                                                                                                                                                             SUBJECTIVE STATEMENT: Patient reports she is feeling cold today. Is having difficulty due to the weather.   Pt accompanied by: self  PERTINENT HISTORY:  Patient is returning to PT s/p hospitalization.  s/p ORIF of R tibia shaft fracture 09/23/2022. Patient has weakness in BLE with RLE>LLE. She drives with hand controls. Patient has been diagnosed with MS in 1995. PMH includes: back pain, CBP, chronic L shoulder pain, galactorrhea, neuropathy, HPV, hypercholestermeia, HTN, MS, osteopenia, PONV, wrist fracture. Additional order for other closed fracture of proximal end of R tibia with routine healing. Still has to use [employer].   PAIN:  Are you having pain? Occasional pain in RLE; primarily in  knee  PRECAUTIONS: Fall  RED FLAGS: None   WEIGHT BEARING RESTRICTIONS: No  FALLS: Has patient fallen in last  6 months? No  LIVING ENVIRONMENT: Lives with: lives with their family Lives in: House/apartment Stairs: ramp Has following equipment at home: Vannie - 2 wheeled, Wheelchair (manual), shower chair, and Grab bars  PLOF: Independent with household mobility with device  PATIENT GOALS: to get her independence back. To be able to get into car, toilet, and get dressed independently  OBJECTIVE:  Note: Objective measures were completed at Evaluation unless otherwise noted.  DIAGNOSTIC FINDINGS: MRI of the brain 03/18/2020 showed multiple T2/FLAIR hyperintense foci in the periventricular, juxtacortical and deep white matter.  There were no infratentorial lesions noted.  None of the foci enhanced.   Due to severe claustrophobia, the study was done with conscious sedation in the hospital   COGNITION: Overall cognitive status: Within functional limits for tasks assessed   SENSATION: Lack of sensation in feet, loss in bilateral lateral aspect of knee  COORDINATION: Does not have the strength for functional LE heel slide test   MUSCLE TONE: BLE mild tone    POSTURE: rounded shoulders, forward head, anterior pelvic tilt, and weight shift left   LOWER EXTREMITY MMT:    MMT Right Eval Left Eval  Hip flexion 0.9 1.5  Hip extension    Hip abduction 1.8 2.6  Hip adduction 3.1 1.9  Hip internal rotation    Hip external rotation    Knee flexion 0.9 1.5  Knee extension 0.8 1.2  Ankle dorsiflexion    Ankle plantarflexion    Ankle inversion    Ankle eversion    (Blank rows = not tested)  BED MOBILITY:  Assess in future session due to limited time  TRANSFERS: Assistive device utilized: Bariatric RW with sit to stands, slide board to table  Sit to stand: unable to reach full stand Stand to sit: unable to reach full stand  Chair to chair: slide board with CGA    GAIT: Unable to ambulate at this time.   FUNCTIONAL TESTS:  Sit to stand: tricep press with BUE; unable to bring arm to walker.    Function In Sitting Test (FIST)  (1/2 femur on surface; hips/knees flexed to 90deg)   - indicate bed or mat table / step stool if used  SCORING KEY: 4 = Independent (completes task independently & successfully) 3 = Verbal Cues/Increased Time (completes task independently & successfully and only needs more time/cues) 2 = Upper Extremity Support (must use UE for support or assistance to complete successfully) 1 = Needs Assistance (unable to complete w/o physical assist; DOCUMENT LEVEL: min, mod, max) 0 = Dependent (requires complete physical assist; unable to complete successfully even w/ physical assist)  Randomly Administer Once Throughout Exam  4 - Anterior Nudge (superior sternum)  4 - Posterior Nudge (between scapular spines)  4 - Lateral Nudge (to dominant side at acromion)     4 - Static sitting (30 seconds)  4 - Sitting, shake no (left and right)  4 - Sitting, eyes closed (30 seconds)   0 - Sitting, lift foot (dominant side, lift foot 1 inch twice)    2 - Pick up object from behind (object at midline, hands breadth posterior)  3 - Forward reach (use dominant arm, must complete full motion) 2 - Lateral reach (use dominant arm, clear opposite ischial tuberosity) 2 - Pick up object from floor (from between feet)   2 - Posterior scooting (move backwards 2 inches)  2 - Anterior scooting (move forward 2 inches)  2 - Lateral scooting (move to dominant side 2 inches)  TOTAL = 39/56  Notes/comments: slide board tranfer to/from table   MCD > 5 points MCID for IP REHAB > 6 points  Function In Sitting Test (FIST) 08/14/23 (1/2 femur on surface; hips/knees flexed to 90deg)   - indicate bed or mat table / step stool if used  SCORING KEY: 4 = Independent (completes task independently & successfully) 3 = Verbal Cues/Increased Time (completes  task independently & successfully and only needs more time/cues) 2 = Upper Extremity Support (must use UE for support or assistance to complete successfully) 1 = Needs Assistance (unable to complete w/o physical assist; DOCUMENT LEVEL: min, mod, max) 0 = Dependent (requires complete physical assist; unable to complete successfully even w/ physical assist)  Randomly Administer Once Throughout Exam  4 - Anterior Nudge (superior sternum)  4 - Posterior Nudge (between scapular spines)  4 - Lateral Nudge (to dominant side at acromion)     4 - Static sitting (30 seconds)  4 - Sitting, shake no (left and right)  4 - Sitting, eyes closed (30 seconds)   0 - Sitting, lift foot (dominant side, lift foot 1 inch twice)    4 - Pick up object from behind (object at midline, hands breadth posterior)  4 - Forward reach (use dominant arm, must complete full motion) 4 - Lateral reach (use dominant arm, clear opposite ischial tuberosity)  - Pick up object from floor (from between feet)   3 - Posterior scooting (move backwards 2 inches)  3 - Anterior scooting (move forward 2 inches)  3 - Lateral scooting (move to dominant side 2 inches)    TOTAL = 47/56  MCD > 5 points MCID for IP REHAB > 6 points   TREATMENT DATE: 05/29/24  Rest breaks given and pt's symptoms monitored throughout the session.  TherEx: To improve strength, endurance, mobility, and function of specific targeted muscle groups or improve joint range of motion or improve muscle flexibility KB 15lb ; modified seatd deadlift 10x; 2 sets GTB ER 10x ; 2 sets  GTB cross body PNF D2 pattern 10x each side GTB Y overhead 10x  TherAct: Pt attempt at standing x 4 trials.  Pt utilized the bariatric FWW, pillow under the thighs for easier ability to come upright, and sock applied to the L foot to reduce friction of the foot in mobility.   Dynadisc: seated on disc -static sit 60 seconds -lateral weight shift 10x each  side -anterior/posterior weight shift 10x    PATIENT EDUCATION:  Education details: Pt educated throughout session about proper posture and technique with exercises. Improved exercise technique, movement at target joints, use of target muscles after min to mod verbal, visual, tactile cues.  Person educated: Patient Education method: Explanation, Demonstration, Tactile cues, and Verbal cues Education comprehension: verbalized understanding, returned demonstration, verbal cues required, tactile cues required, and needs further education    HOME EXERCISE PROGRAM: Stand 3x/day in stander   GOALS: Goals reviewed with patient? Yes  SHORT TERM GOALS: Target date: 08/08/2023  Patient will be independent in home exercise program to improve strength/mobility for better functional independence with ADLs.  Baseline: 4/8: compliance Goal status: MET    LONG TERM GOALS: Target date: 06/12/24  Patient will tolerate five consecutive stands with UE support from wheelchair to standing to improve functional mobility with additional cushion and RW.  Baseline: unable to perform 4/8:unable to perform full stand 5/20: perform next session 5/29: two full stands with CGA and cushion on her seat 7/31: able to stand  3 times from [facility]. With no addition cushion  in parallel bars. 12/27/2023= Will retest next session- concentrated on just standing on past 2 visits 03/20/24:  Goal status: Ongoing   2.  Patient will ambulate 10 ft with bRW with wheelchair follow.  Baseline:  unable to ambulate 4/8: unable to ambulate 5/20: able to ambulate length of // bars in previous session 5/27: ambulate 2.5 lengths of // bars with two seated rest breaks with min A for weight shift and wheelchair follow  7/31: able to ambulate in parallel bars 3 ft with min assist and +2 follow in WC> difficulty advancing the LLE on this day. 12/27/2023- Patient unable to take a step forward today but was abel to take a step on previous visit-  limited by clonus like trembling that has progressed over past month. 03/20/24: 26' at one time Goal status: MET   3.  Patient will improve FIST score >6 points to demonstrate improved stability and ability to perform ADLs.  Baseline:  3/6: 39/56 4/8: 47/ 56 5/20: 48/56 7/31:46/56 Goal status: MET  4.  Patient will perform toileting with mod I at home for improved independence.  Baseline: 3/6: requires assistance 4/8: requires dependence on machine 5/20: requires assistance with use of wipe buddy.  7/31: continues to require use of sara steady and wipe buddy for safety and hygiene. 12/27/2023- Patient reports that since she is unable to take steps that she continues to require use of sara steady and wipe buddy for safe personal hygiene. 03/20/24: Pt utilizing sara stedy to perform safe personal hygiene, however is no longer utilizing the wipe buddy. 12/30: using sara stedy for toileting, able to clean independently  Goal status: PROGRESSING   5. Patient will perform > 5 min of static or dynamic standing with BUE and Min/mod assist  for improved LE strength and pre-gait function to assist with toileting and overall standing ability for improve functional independence.  Baseline: 12/27/2023-Patient was able to stand for just over 3 min today. 01/10/24: Pt able to stand 1 min 38 sec 02/14/24: Pt able to >3 minutes at this time. 03/20/24: Pt able to achieve >4 minutes at this time. Goal status: PROGRESSING  6.  Patient will ambulate 40 ft with BRW with wheelchair follow, in one attempt, without rest break. Baseline: 37' during one attempt with sock 12/30: 21.2 ft with BrW wheelchair follow and no sock Goal status: Partially Met   ASSESSMENT:  CLINICAL IMPRESSION: The patient presented with increased sensitivity to cold weather which is consistent with common symptom fluctuations in individuals with MS. During todays session she demonstrated effortful but productive participation in therapeutic  exercise and activity. Strength and mobility work using a kettlebell seated deadlift and dynamic seated balance on the dynadisc focused on improving muscle activation, postural control, and functional readiness. Attempts at standing with the bariatric walker and positioned supports showed continued need for assistance, but also showed meaningful engagement of the lower extremities and core musculature. Rest breaks and symptom monitoring were required due to weather related symptom exacerbation. Continued physical therapy remains beneficial to address ongoing impairments in strength, endurance, balance, and functional mobility that directly impact her independence and safety. Consistent skilled intervention can help improve neuromuscular recruitment, promote more efficient transfers, and support gradual progress toward improved standing tolerance and mobility. Regular therapy also provides structured monitoring of MS related fluctuations which helps guide safe advancement of activity.    OBJECTIVE IMPAIRMENTS: Abnormal gait, cardiopulmonary status limiting activity, decreased activity tolerance, decreased balance,  decreased coordination, decreased endurance, decreased mobility, difficulty walking, decreased ROM, decreased strength, hypomobility, increased fascial restrictions, impaired perceived functional ability, impaired flexibility, impaired sensation, improper body mechanics, and postural dysfunction.   ACTIVITY LIMITATIONS: carrying, lifting, bending, sitting, standing, squatting, sleeping, stairs, transfers, bed mobility, continence, bathing, toileting, dressing, self feeding, reach over head, hygiene/grooming, locomotion level, and caring for others  PARTICIPATION LIMITATIONS: meal prep, cleaning, laundry, interpersonal relationship, driving, shopping, community activity, and church  PERSONAL FACTORS: Age, Past/current experiences, Time since onset of injury/illness/exacerbation, Transportation,  and 3+ comorbidities: ack pain, CBP, chronic L shoulder pain, galactorrhea, neuropathy, HPV, hypercholestermeia, HTN, MS, osteopenia, PONV, wrist fracture are also affecting patient's functional outcome.   REHAB POTENTIAL: Good  CLINICAL DECISION MAKING: Evolving/moderate complexity  EVALUATION COMPLEXITY: Moderate  PLAN:  PT FREQUENCY: 2x/week  PT DURATION: 12 weeks  PLANNED INTERVENTIONS: 97164- PT Re-evaluation, 97110-Therapeutic exercises, 97530- Therapeutic activity, 97112- Neuromuscular re-education, 97535- Self Care, 02859- Manual therapy, [phone]- Gait training, [phone]- Orthotic Fit/training, [phone]- Canalith repositioning, [id number]- Aquatic Therapy, [phone]- Electrical stimulation (unattended), [phone]- Electrical stimulation (manual), [id number]- Ultrasound, 02987- Traction (mechanical), Patient/Family education, Balance training, Stair training, Taping, Dry Needling, Joint mobilization, Joint manipulation, Spinal manipulation, Spinal mobilization, Scar mobilization, Compression bandaging, Vestibular training, Visual/preceptual remediation/compensation, Cognitive remediation, DME instructions, Cryotherapy, Moist heat, and Biofeedback  PLAN FOR NEXT SESSION:  Strap stand frame with more dynamic activities Sit to stands; continue supported walking, continue dynamic core stabilization training and seated reaching tasks to improve weight shifting.  Continue with ambulation in the hallway with the bariatric walker, sock, and use of cushion for easier lift off.   Lewie Deman  Leopoldo, PT, DPT Physical Therapist - [facility]  Outpatient Physical Therapy- [facility] [phone]    05/29/24, 11:01 AM  "

## 2024-05-28 NOTE — Patient Instructions (Signed)
 What is a GLP-1 Glucagon like peptide-1 (GLP-1) agonists represent a class of medications used to treat type 2 diabetes mellitus and obesity.  GLP-1 medications mimic the action of a hormone called glucagon like peptide 1.  When blood sugar levels start to rise/increase these drugs stimulate the body to produce more insulin.  When that happens, the extra insulin helps to lower the blood sugar levels in the body.  This in returns helps with decreasing cravings.  These medications also slow the movement of food from the stomach into the small intestine.  This in return helps one to full faster and longer.   Diabetic medications: Approved for treatment of diabetes mellitus but does not have full approval for weight loss use Victoza (liraglutide) Ozempic (semaglutide) Mounjaro Trulicity Rybelsus  Weight loss medications: Approved for long-term weight loss use.        Saxenda (liraglutide) Wegovy (semaglutide) Zepbound Contraindications:  Pancreatitis (active gallstones) Medullary thyroid cancer High triglycerides (>500)-will need labs prior to starting Multiple Endocrine Neoplasia syndrome type 2 (MEN 2) Trying to get pregnant Breastfeeding Use with caution with taking insulin or sulfonylureas (will need to monitor blood sugars for hypoglycemia) Side effects (most common): Most common side effects are nausea, gas, bloating and constipation.  Other possible side effects are headaches, belching, diarrhea, tiredness (fatigue), vomiting, upset stomach, dizziness, heartburn and stomach (abdominal pain).  If you think that you are becoming dehydrated, please inform our office or your primary family provider.  Stop immediately and go to ER if you have any symptoms of a serious allergic reaction including swelling of your face, lips, tongue or throat; problems breathing or swallowing; severe rash or itching; fainting or feeling dizzy; or very rapid heart rate.

## 2024-05-29 ENCOUNTER — Encounter (INDEPENDENT_AMBULATORY_CARE_PROVIDER_SITE_OTHER): Payer: Self-pay | Admitting: Nurse Practitioner

## 2024-05-29 ENCOUNTER — Ambulatory Visit: Payer: PPO

## 2024-05-29 ENCOUNTER — Encounter

## 2024-05-29 DIAGNOSIS — M6281 Muscle weakness (generalized): Secondary | ICD-10-CM

## 2024-05-29 DIAGNOSIS — G35D Multiple sclerosis, unspecified: Secondary | ICD-10-CM

## 2024-05-29 DIAGNOSIS — R2681 Unsteadiness on feet: Secondary | ICD-10-CM

## 2024-05-30 ENCOUNTER — Encounter: Payer: Self-pay | Admitting: Neurology

## 2024-06-03 ENCOUNTER — Ambulatory Visit

## 2024-06-03 ENCOUNTER — Ambulatory Visit: Payer: PPO

## 2024-06-03 ENCOUNTER — Telehealth (INDEPENDENT_AMBULATORY_CARE_PROVIDER_SITE_OTHER): Payer: Self-pay | Admitting: *Deleted

## 2024-06-03 ENCOUNTER — Other Ambulatory Visit: Payer: Self-pay | Admitting: Neurology

## 2024-06-03 DIAGNOSIS — R269 Unspecified abnormalities of gait and mobility: Secondary | ICD-10-CM

## 2024-06-03 DIAGNOSIS — G35D Multiple sclerosis, unspecified: Secondary | ICD-10-CM

## 2024-06-03 NOTE — Telephone Encounter (Signed)
 MEDICATION :(WEGOVY -0.25/0.5 ML)HAS BEEN SENT TO PLAN FOR PRIOR APPROVAL, WAITING FOR THEIR RESPONSE.

## 2024-06-04 NOTE — Telephone Encounter (Signed)
Order placed for signature.

## 2024-06-05 ENCOUNTER — Encounter

## 2024-06-05 ENCOUNTER — Ambulatory Visit: Payer: PPO

## 2024-06-06 NOTE — Telephone Encounter (Signed)
 Prior authorization has been submitted and questions answered.  Awaiting determination.

## 2024-06-10 ENCOUNTER — Ambulatory Visit: Payer: PPO

## 2024-06-10 ENCOUNTER — Ambulatory Visit

## 2024-06-11 ENCOUNTER — Encounter (INDEPENDENT_AMBULATORY_CARE_PROVIDER_SITE_OTHER): Payer: Self-pay | Admitting: Nurse Practitioner

## 2024-06-11 NOTE — Telephone Encounter (Signed)
 Per Cover My Meds:  Message from plan: We have denied your request as the diagnosis submitted is considered to be part of an excluded (not covered) category, medications utilized to promote weight loss, by the [employer] for [employer] and Medicaid Services (CMS)

## 2024-06-12 ENCOUNTER — Ambulatory Visit: Payer: PPO | Attending: Neurology

## 2024-06-12 ENCOUNTER — Encounter

## 2024-06-12 ENCOUNTER — Telehealth (INDEPENDENT_AMBULATORY_CARE_PROVIDER_SITE_OTHER): Payer: Self-pay | Admitting: *Deleted

## 2024-06-12 DIAGNOSIS — R2681 Unsteadiness on feet: Secondary | ICD-10-CM

## 2024-06-12 DIAGNOSIS — G35D Multiple sclerosis, unspecified: Secondary | ICD-10-CM

## 2024-06-12 DIAGNOSIS — M6281 Muscle weakness (generalized): Secondary | ICD-10-CM

## 2024-06-12 NOTE — Telephone Encounter (Signed)
 The medication (Wegovy - 0.25 mg/0.25 ml) has been denied Insurance  denied the request as the diagnosis submitted is considered to be part of an excluded (not covered) category, medications utilized to promote weight loss, by the [employer] and [employer] (CMS). Section 1927(d)(2) Patient has been updated thru MyChart.

## 2024-06-12 NOTE — Therapy (Signed)
 "       OUTPATIENT PHYSICAL THERAPY NEURO TREATMENT/ RECERT     Patient Name: Lisa Williams MRN: [id number] DOB:1961-03-20, 64 y.o., female Today's Date: 06/12/2024  PCP: Lenon Na  REFERRING PROVIDER: Lenon Na   END OF SESSION:  PT End of Session - 06/12/24 1013     Visit Number 84    Number of Visits 108    Date for Recertification  09/04/24   corrected   Progress Note Due on Visit 50    PT Start Time 1014    PT Stop Time 1058    PT Time Calculation (min) 44 min    Equipment Utilized During Treatment Gait belt    Activity Tolerance Patient tolerated treatment well;No increased pain    Behavior During Therapy [facility] for tasks assessed/performed                 Past Medical History:  Diagnosis Date   Abdominal pain, right upper quadrant    Back pain    Calculus of kidney 12/09/2013   Chronic back pain    unspecified   Chronic left shoulder pain 07/19/2015   Complication of anesthesia    Functional disorder of bladder    other   Galactorrhea 11/26/2014   Chronic    Hereditary and idiopathic neuropathy 08/19/2013   High cholesterol    History of kidney stones    HPV test positive    Hypercholesteremia 08/19/2013   Hypertension    Hypertension    Hypothyroidism    Incomplete bladder emptying    Microscopic hematuria    MS (multiple sclerosis)    Muscle spasticity 05/21/2014   Nonspecific findings on examination of urine    other   Osteopenia    PONV (postoperative nausea and vomiting)    Prediabetes    Sickle cell trait    Status post laparoscopic supracervical hysterectomy 11/26/2014   Tobacco user 11/26/2014   Wrist fracture    Past Surgical History:  Procedure Laterality Date   bilateral tubal ligation  1996   BREAST CYST EXCISION Left 2002   CYST EXCISION Left 05/10/2022   Procedure: CYST REMOVAL;  Surgeon: Rodolph Romano, MD;  Location: [facility] ORS;  Service: General;  Laterality: Left;   FRACTURE SURGERY      KNEE SURGERY     right   LAPAROSCOPIC SUPRACERVICAL HYSTERECTOMY  08/05/2013   ORIF WRIST FRACTURE Left 01/17/2017   Procedure: OPEN REDUCTION INTERNAL FIXATION (ORIF) WRIST FRACTURE;  Surgeon: Leora Lynwood SAUNDERS, MD;  Location: [facility] ORS;  Service: Orthopedics;  Laterality: Left;   RADIOLOGY WITH ANESTHESIA N/A 03/18/2020   Procedure: MRI WITH ANESTHESIA CERVICAL SPINE AND BRAIN  WITH AND WITHOUT CONTRAST;  Surgeon: Radiologist, Medication, MD;  Location: [facility] OR;  Service: Radiology;  Laterality: N/A;   right tibia fracture Right    2024   TUBAL LIGATION Bilateral    VAGINAL HYSTERECTOMY  03/2006   VAGINAL HYSTERECTOMY     2014   Patient Active Problem List   Diagnosis Date Noted   Health care maintenance 04/21/2024   SOB (shortness of breath) on exertion 03/12/2024   Hyperglycemia 01/09/2024   Abnormal LFTs (liver function tests) 09/17/2023   Sinus tachycardia 07/07/2023   Aortic atherosclerosis 01/18/2023   Prediabetes 01/18/2023   Hypokalemia 07/29/2022   Weakness of both lower extremities 07/29/2022   Abscess of left groin 11/15/2021   Abnormal LFTs 11/15/2021   Acute respiratory disease due to COVID-19 virus 11/21/2020   Fatigue due to excessive  exertion    Hypoalbuminemia due to protein-calorie malnutrition    Neurogenic bowel    Neurogenic bladder    Labile blood pressure    Neuropathic pain    Multiple sclerosis, relapsing-remitting 07/09/2019   Abscess of female pelvis    SVT (supraventricular tachycardia)    Radial styloid tenosynovitis 03/12/2018   Wheelchair dependence 02/27/2018   Localized osteoporosis with current pathological fracture with routine healing 01/19/2017   Wrist fracture 01/16/2017   Sprain of ankle 03/23/2016   Closed fracture of lateral malleolus 03/16/2016   Depression screening 01/24/2016   Blood pressure elevated without history of HTN 10/25/2015   Essential hypertension 10/25/2015   Multiple sclerosis 10/02/2015   Chronic left shoulder pain  07/19/2015   Multiple sclerosis exacerbation 07/14/2015   MS (multiple sclerosis) 11/26/2014   Increased body mass index 11/26/2014   HPV test positive 11/26/2014   Status post laparoscopic supracervical hysterectomy 11/26/2014   Galactorrhea 11/26/2014   Back ache 05/21/2014   Adiposity 05/21/2014   Disordered sleep 05/21/2014   Muscle spasticity 05/21/2014   Spasticity 05/21/2014   Calculus of kidney 12/09/2013   Renal colic 12/09/2013   Hypercholesteremia 08/19/2013   Hereditary and idiopathic neuropathy 08/19/2013   Hypercholesterolemia without hypertriglyceridemia 08/19/2013   Bladder infection, chronic 07/25/2012   Disorder of bladder function 07/25/2012   Incomplete bladder emptying 07/25/2012   Microscopic hematuria 07/25/2012   Right upper quadrant pain 07/25/2012    ONSET DATE: 1995  REFERRING DIAG: MS  THERAPY DIAG:  Muscle weakness (generalized) - Plan: PT plan of care cert/re-cert  Multiple sclerosis exacerbation - Plan: PT plan of care cert/re-cert  Unsteadiness on feet - Plan: PT plan of care cert/re-cert  Rationale for Evaluation and Treatment: Rehabilitation  SUBJECTIVE:                                                                                                                                                                                             SUBJECTIVE STATEMENT: Patient has missed past few sessions and is feeling the cold weather with the snow impacting her mobility and ability to move. Feels very cold.   Pt accompanied by: self  PERTINENT HISTORY:  Patient is returning to PT s/p hospitalization.  s/p ORIF of R tibia shaft fracture 09/23/2022. Patient has weakness in BLE with RLE>LLE. She drives with hand controls. Patient has been diagnosed with MS in 1995. PMH includes: back pain, CBP, chronic L shoulder pain, galactorrhea, neuropathy, HPV, hypercholestermeia, HTN, MS, osteopenia, PONV, wrist fracture. Additional order for other closed  fracture of proximal end of R tibia with routine healing. Still has to use Sara  Lift.   PAIN:  Are you having pain? Occasional pain in RLE; primarily in knee  PRECAUTIONS: Fall  RED FLAGS: None   WEIGHT BEARING RESTRICTIONS: No  FALLS: Has patient fallen in last 6 months? No  LIVING ENVIRONMENT: Lives with: lives with their family Lives in: House/apartment Stairs: ramp Has following equipment at home: Vannie - 2 wheeled, Wheelchair (manual), shower chair, and Grab bars  PLOF: Independent with household mobility with device  PATIENT GOALS: to get her independence back. To be able to get into car, toilet, and get dressed independently  OBJECTIVE:  Note: Objective measures were completed at Evaluation unless otherwise noted.  DIAGNOSTIC FINDINGS: MRI of the brain 03/18/2020 showed multiple T2/FLAIR hyperintense foci in the periventricular, juxtacortical and deep white matter.  There were no infratentorial lesions noted.  None of the foci enhanced.   Due to severe claustrophobia, the study was done with conscious sedation in the hospital   COGNITION: Overall cognitive status: Within functional limits for tasks assessed   SENSATION: Lack of sensation in feet, loss in bilateral lateral aspect of knee  COORDINATION: Does not have the strength for functional LE heel slide test   MUSCLE TONE: BLE mild tone    POSTURE: rounded shoulders, forward head, anterior pelvic tilt, and weight shift left   LOWER EXTREMITY MMT:    MMT Right Eval Left Eval  Hip flexion 0.9 1.5  Hip extension    Hip abduction 1.8 2.6  Hip adduction 3.1 1.9  Hip internal rotation    Hip external rotation    Knee flexion 0.9 1.5  Knee extension 0.8 1.2  Ankle dorsiflexion    Ankle plantarflexion    Ankle inversion    Ankle eversion    (Blank rows = not tested)  BED MOBILITY:  Assess in future session due to limited time  TRANSFERS: Assistive device utilized: Bariatric RW with sit to stands,  slide board to table  Sit to stand: unable to reach full stand Stand to sit: unable to reach full stand  Chair to chair: slide board with CGA   GAIT: Unable to ambulate at this time.   FUNCTIONAL TESTS:  Sit to stand: tricep press with BUE; unable to bring arm to walker.    Function In Sitting Test (FIST)  (1/2 femur on surface; hips/knees flexed to 90deg)   - indicate bed or mat table / step stool if used  SCORING KEY: 4 = Independent (completes task independently & successfully) 3 = Verbal Cues/Increased Time (completes task independently & successfully and only needs more time/cues) 2 = Upper Extremity Support (must use UE for support or assistance to complete successfully) 1 = Needs Assistance (unable to complete w/o physical assist; DOCUMENT LEVEL: min, mod, max) 0 = Dependent (requires complete physical assist; unable to complete successfully even w/ physical assist)  Randomly Administer Once Throughout Exam  4 - Anterior Nudge (superior sternum)  4 - Posterior Nudge (between scapular spines)  4 - Lateral Nudge (to dominant side at acromion)     4 - Static sitting (30 seconds)  4 - Sitting, shake no (left and right)  4 - Sitting, eyes closed (30 seconds)   0 - Sitting, lift foot (dominant side, lift foot 1 inch twice)    2 - Pick up object from behind (object at midline, hands breadth posterior)  3 - Forward reach (use dominant arm, must complete full motion) 2 - Lateral reach (use dominant arm, clear opposite ischial tuberosity) 2 - Pick up object  from floor (from between feet)   2 - Posterior scooting (move backwards 2 inches)  2 - Anterior scooting (move forward 2 inches)  2 - Lateral scooting (move to dominant side 2 inches)    TOTAL = 39/56  Notes/comments: slide board tranfer to/from table   MCD > 5 points MCID for IP REHAB > 6 points  Function In Sitting Test (FIST) 08/14/23 (1/2 femur on surface; hips/knees flexed to 90deg)   - indicate bed or mat  table / step stool if used  SCORING KEY: 4 = Independent (completes task independently & successfully) 3 = Verbal Cues/Increased Time (completes task independently & successfully and only needs more time/cues) 2 = Upper Extremity Support (must use UE for support or assistance to complete successfully) 1 = Needs Assistance (unable to complete w/o physical assist; DOCUMENT LEVEL: min, mod, max) 0 = Dependent (requires complete physical assist; unable to complete successfully even w/ physical assist)  Randomly Administer Once Throughout Exam  4 - Anterior Nudge (superior sternum)  4 - Posterior Nudge (between scapular spines)  4 - Lateral Nudge (to dominant side at acromion)     4 - Static sitting (30 seconds)  4 - Sitting, shake no (left and right)  4 - Sitting, eyes closed (30 seconds)   0 - Sitting, lift foot (dominant side, lift foot 1 inch twice)    4 - Pick up object from behind (object at midline, hands breadth posterior)  4 - Forward reach (use dominant arm, must complete full motion) 4 - Lateral reach (use dominant arm, clear opposite ischial tuberosity)  - Pick up object from floor (from between feet)   3 - Posterior scooting (move backwards 2 inches)  3 - Anterior scooting (move forward 2 inches)  3 - Lateral scooting (move to dominant side 2 inches)    TOTAL = 47/56  MCD > 5 points MCID for IP REHAB > 6 points   TREATMENT DATE: 06/12/24 Physical therapy treatment session today consisted of completing assessment of goals and administration of testing as demonstrated and documented in flow sheet, treatment, and goals section of this note. Addition treatments may be found below.   Rest breaks given and pt's symptoms monitored throughout the session.   TherAct: Pt attempt at standing x 4 trials.  Pt utilized the bariatric FWW, pillow under the thighs for easier ability to come upright, and sock applied to the L foot to reduce friction of the foot in mobility.    Dynadisc: seated on disc -static sit 60 seconds -lateral weight shift 10x each side -anterior/posterior weight shift 10x   Dynadisc: pf 20x, df 20x, hamstring isometric 10x 10 seconds  Posture in chair with focus on stretching anterior musculature x multiple reps     PATIENT EDUCATION:  Education details: Pt educated throughout session about proper posture and technique with exercises. Improved exercise technique, movement at target joints, use of target muscles after min to mod verbal, visual, tactile cues.  Person educated: Patient Education method: Explanation, Demonstration, Tactile cues, and Verbal cues Education comprehension: verbalized understanding, returned demonstration, verbal cues required, tactile cues required, and needs further education    HOME EXERCISE PROGRAM: Stand 3x/day in stander   GOALS: Goals reviewed with patient? Yes  SHORT TERM GOALS: Target date: 08/08/2023  Patient will be independent in home exercise program to improve strength/mobility for better functional independence with ADLs.  Baseline: 4/8: compliance Goal status: MET    LONG TERM GOALS: Target date: 09/04/2024    Patient  will tolerate five consecutive stands with UE support from wheelchair to standing to improve functional mobility with additional cushion and RW.  Baseline: unable to perform 4/8:unable to perform full stand 5/20: perform next session 5/29: two full stands with CGA and cushion on her seat 7/31: able to stand 3 times from [facility]. With no addition cushion  in parallel bars. 12/27/2023= Will retest next session- concentrated on just standing on past 2 visits 2/5: unable to test this session due to weather limitations, assess next session Goal status: Ongoing   2.  Patient will ambulate 10 ft with bRW with wheelchair follow.  Baseline:  unable to ambulate 4/8: unable to ambulate 5/20: able to ambulate length of // bars in previous session 5/27: ambulate 2.5 lengths of // bars  with two seated rest breaks with min A for weight shift and wheelchair follow  7/31: able to ambulate in parallel bars 3 ft with min assist and +2 follow in WC> difficulty advancing the LLE on this day. 12/27/2023- Patient unable to take a step forward today but was abel to take a step on previous visit- limited by clonus like trembling that has progressed over past month. 03/20/24: 26' at one time Goal status: MET   3.  Patient will improve FIST score >6 points to demonstrate improved stability and ability to perform ADLs.  Baseline:  3/6: 39/56 4/8: 47/ 56 5/20: 48/56 7/31:46/56 Goal status: MET  4.  Patient will perform toileting with mod I at home for improved independence.  Baseline: 3/6: requires assistance 4/8: requires dependence on machine 5/20: requires assistance with use of wipe buddy.  7/31: continues to require use of sara steady and wipe buddy for safety and hygiene. 12/27/2023- Patient reports that since she is unable to take steps that she continues to require use of sara steady and wipe buddy for safe personal hygiene. 03/20/24: Pt utilizing sara stedy to perform safe personal hygiene, however is no longer utilizing the wipe buddy. 12/30: using sara stedy for toileting, able to clean independently  2/5: still using sara stedy for toileting, independent self clean  Goal status: PROGRESSING   5. Patient will perform > 5 min of static or dynamic standing with BUE and Min/mod assist  for improved LE strength and pre-gait function to assist with toileting and overall standing ability for improve functional independence.  Baseline: 12/27/2023-Patient was able to stand for just over 3 min today. 01/10/24: Pt able to stand 1 min 38 sec 02/14/24: Pt able to >3 minutes at this time. 03/20/24: Pt able to achieve >4 minutes at this time. 2/5: unable to test this session due to weather limitations, assess next session Goal status: PROGRESSING  6.  Patient will ambulate 40 ft with BRW with  wheelchair follow, in one attempt, without rest break. Baseline: 22' during one attempt with sock 12/30: 21.2 ft with BrW wheelchair follow and no sock 2/5: unable to test this session due to weather limitations, assess next session Goal status: Partially Met   ASSESSMENT:  CLINICAL IMPRESSION:  The patient is a 64 year old female with a diagnosis of multiple sclerosis presenting for recertification. She has demonstrated meaningful progress toward her primary goals of improving standing tolerance and ambulation prior to recent environmental barriers. Over the current certification period, the patient showed gradual improvements in upright tolerance and functional weight bearing, indicating benefit from skilled outpatient physical therapy focused on strength, balance, and functional mobility. However, recent winter weather, including snow and persistently cold temperatures, has  significantly limited her ability to safely attend therapy sessions and has negatively impacted her overall mobility and consistency with training. Cold exposure and reduced activity related to weather conditions have contributed to increased stiffness, fatigue, and neuromuscular inefficiency, resulting in a temporary regression in standing tolerance and performance during todays session. The patients participation and functional capacity were notably affected by the colder weather, though she continues to demonstrate potential for improvement when able to engage consistently in therapy. Continued skilled physical therapy is warranted to regain recent losses, address temperature sensitive symptom exacerbation, and progress standing and walking endurance. Ongoing intervention will support maximal functional recovery, improve safety with upright mobility, and promote long term participation in daily activities despite environmental challenges.   OBJECTIVE IMPAIRMENTS: Abnormal gait, cardiopulmonary status limiting activity,  decreased activity tolerance, decreased balance, decreased coordination, decreased endurance, decreased mobility, difficulty walking, decreased ROM, decreased strength, hypomobility, increased fascial restrictions, impaired perceived functional ability, impaired flexibility, impaired sensation, improper body mechanics, and postural dysfunction.   ACTIVITY LIMITATIONS: carrying, lifting, bending, sitting, standing, squatting, sleeping, stairs, transfers, bed mobility, continence, bathing, toileting, dressing, self feeding, reach over head, hygiene/grooming, locomotion level, and caring for others  PARTICIPATION LIMITATIONS: meal prep, cleaning, laundry, interpersonal relationship, driving, shopping, community activity, and church  PERSONAL FACTORS: Age, Past/current experiences, Time since onset of injury/illness/exacerbation, Transportation, and 3+ comorbidities: ack pain, CBP, chronic L shoulder pain, galactorrhea, neuropathy, HPV, hypercholestermeia, HTN, MS, osteopenia, PONV, wrist fracture are also affecting patient's functional outcome.   REHAB POTENTIAL: Good  CLINICAL DECISION MAKING: Evolving/moderate complexity  EVALUATION COMPLEXITY: Moderate  PLAN:  PT FREQUENCY: 2x/week  PT DURATION: 12 weeks  PLANNED INTERVENTIONS: 97164- PT Re-evaluation, 97110-Therapeutic exercises, 97530- Therapeutic activity, 97112- Neuromuscular re-education, 97535- Self Care, [id number]- Manual therapy, [phone]- Gait training, [phone]- Orthotic Fit/training, [phone]- Canalith repositioning, [id number]- Aquatic Therapy, [phone]- Electrical stimulation (unattended), [phone]- Electrical stimulation (manual), [id number]- Ultrasound, [id number]- Traction (mechanical), Patient/Family education, Balance training, Stair training, Taping, Dry Needling, Joint mobilization, Joint manipulation, Spinal manipulation, Spinal mobilization, Scar mobilization, Compression bandaging, Vestibular training, Visual/preceptual remediation/compensation, Cognitive  remediation, DME instructions, Cryotherapy, Moist heat, and Biofeedback  PLAN FOR NEXT SESSION: -assess goals  Strap stand frame with more dynamic activities Sit to stands; continue supported walking, continue dynamic core stabilization training and seated reaching tasks to improve weight shifting.  Continue with ambulation in the hallway with the bariatric walker, sock, and use of cushion for easier lift off.   Cheng Dec  Leopoldo, PT, DPT Physical Therapist - [facility]  Outpatient Physical Therapy- Main Campus [phone]    06/12/24, 11:00 AM  "

## 2024-06-17 ENCOUNTER — Ambulatory Visit: Payer: PPO

## 2024-06-17 ENCOUNTER — Ambulatory Visit

## 2024-06-19 ENCOUNTER — Ambulatory Visit: Payer: PPO

## 2024-06-19 ENCOUNTER — Encounter

## 2024-06-24 ENCOUNTER — Ambulatory Visit: Payer: PPO

## 2024-06-24 ENCOUNTER — Ambulatory Visit

## 2024-06-25 ENCOUNTER — Ambulatory Visit (INDEPENDENT_AMBULATORY_CARE_PROVIDER_SITE_OTHER): Admitting: Nurse Practitioner

## 2024-06-26 ENCOUNTER — Ambulatory Visit

## 2024-06-26 ENCOUNTER — Encounter

## 2024-07-01 ENCOUNTER — Ambulatory Visit

## 2024-07-03 ENCOUNTER — Encounter

## 2024-07-03 ENCOUNTER — Ambulatory Visit

## 2024-07-08 ENCOUNTER — Ambulatory Visit: Attending: Neurology

## 2024-07-08 ENCOUNTER — Ambulatory Visit

## 2024-07-10 ENCOUNTER — Ambulatory Visit

## 2024-07-10 ENCOUNTER — Encounter

## 2024-07-15 ENCOUNTER — Ambulatory Visit

## 2024-07-17 ENCOUNTER — Ambulatory Visit

## 2024-07-17 ENCOUNTER — Encounter

## 2024-07-21 ENCOUNTER — Ambulatory Visit: Admitting: Internal Medicine

## 2024-07-22 ENCOUNTER — Ambulatory Visit

## 2024-07-24 ENCOUNTER — Ambulatory Visit

## 2024-07-24 ENCOUNTER — Encounter

## 2024-07-28 ENCOUNTER — Ambulatory Visit: Admitting: Internal Medicine

## 2024-07-29 ENCOUNTER — Ambulatory Visit

## 2024-07-29 ENCOUNTER — Encounter

## 2024-07-31 ENCOUNTER — Ambulatory Visit

## 2024-07-31 ENCOUNTER — Encounter

## 2024-08-05 ENCOUNTER — Ambulatory Visit

## 2024-08-05 ENCOUNTER — Encounter

## 2024-08-07 ENCOUNTER — Ambulatory Visit

## 2024-08-07 ENCOUNTER — Encounter

## 2024-08-12 ENCOUNTER — Ambulatory Visit

## 2024-08-12 ENCOUNTER — Encounter

## 2024-08-14 ENCOUNTER — Encounter

## 2024-08-14 ENCOUNTER — Ambulatory Visit

## 2024-08-19 ENCOUNTER — Encounter

## 2024-08-19 ENCOUNTER — Ambulatory Visit

## 2024-08-21 ENCOUNTER — Ambulatory Visit

## 2024-08-21 ENCOUNTER — Encounter

## 2024-08-22 ENCOUNTER — Ambulatory Visit: Admitting: Internal Medicine

## 2024-08-26 ENCOUNTER — Ambulatory Visit

## 2024-08-26 ENCOUNTER — Encounter

## 2024-08-28 ENCOUNTER — Encounter

## 2024-08-28 ENCOUNTER — Ambulatory Visit

## 2024-09-02 ENCOUNTER — Ambulatory Visit

## 2024-09-02 ENCOUNTER — Encounter

## 2024-09-04 ENCOUNTER — Ambulatory Visit

## 2024-09-04 ENCOUNTER — Encounter

## 2024-09-09 ENCOUNTER — Ambulatory Visit

## 2024-09-09 ENCOUNTER — Encounter

## 2024-09-11 ENCOUNTER — Encounter

## 2024-09-11 ENCOUNTER — Ambulatory Visit

## 2024-09-15 ENCOUNTER — Ambulatory Visit: Admitting: Neurology

## 2024-09-16 ENCOUNTER — Encounter

## 2024-09-16 ENCOUNTER — Ambulatory Visit

## 2025-02-20 ENCOUNTER — Ambulatory Visit
